# Patient Record
Sex: Male | Born: 1950 | Race: White | Hispanic: No | Marital: Married | Smoking: Former smoker
Health system: Southern US, Community
[De-identification: ages and names within clinical notes are randomized; demographics above are authoritative.]

## PROBLEM LIST (undated history)

## (undated) DIAGNOSIS — Z973 Presence of spectacles and contact lenses: Secondary | ICD-10-CM

## (undated) DIAGNOSIS — D229 Melanocytic nevi, unspecified: Secondary | ICD-10-CM

## (undated) DIAGNOSIS — Z8619 Personal history of other infectious and parasitic diseases: Secondary | ICD-10-CM

## (undated) DIAGNOSIS — Z9189 Other specified personal risk factors, not elsewhere classified: Secondary | ICD-10-CM

## (undated) DIAGNOSIS — I4510 Unspecified right bundle-branch block: Secondary | ICD-10-CM

## (undated) DIAGNOSIS — I509 Heart failure, unspecified: Secondary | ICD-10-CM

## (undated) DIAGNOSIS — N289 Disorder of kidney and ureter, unspecified: Secondary | ICD-10-CM

## (undated) DIAGNOSIS — G4730 Sleep apnea, unspecified: Secondary | ICD-10-CM

## (undated) DIAGNOSIS — C679 Malignant neoplasm of bladder, unspecified: Secondary | ICD-10-CM

## (undated) DIAGNOSIS — C4492 Squamous cell carcinoma of skin, unspecified: Secondary | ICD-10-CM

## (undated) DIAGNOSIS — Z933 Colostomy status: Secondary | ICD-10-CM

## (undated) DIAGNOSIS — B379 Candidiasis, unspecified: Secondary | ICD-10-CM

## (undated) DIAGNOSIS — C61 Malignant neoplasm of prostate: Secondary | ICD-10-CM

## (undated) DIAGNOSIS — K219 Gastro-esophageal reflux disease without esophagitis: Secondary | ICD-10-CM

## (undated) DIAGNOSIS — Z8719 Personal history of other diseases of the digestive system: Secondary | ICD-10-CM

## (undated) DIAGNOSIS — E119 Type 2 diabetes mellitus without complications: Secondary | ICD-10-CM

## (undated) HISTORY — PX: ABDOMINAL SURGERY: SHX537

## (undated) HISTORY — PX: ILEO CONDUIT: SHX1778

---
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
## 1983-11-13 HISTORY — PX: CHOLECYSTECTOMY: SHX55

## 2005-05-25 DIAGNOSIS — D229 Melanocytic nevi, unspecified: Secondary | ICD-10-CM

## 2005-05-25 HISTORY — DX: Melanocytic nevi, unspecified: D22.9

## 2012-09-02 ENCOUNTER — Other Ambulatory Visit: Payer: Self-pay | Admitting: Urology

## 2012-09-25 ENCOUNTER — Encounter (HOSPITAL_COMMUNITY): Payer: Self-pay | Admitting: Pharmacy Technician

## 2012-09-26 ENCOUNTER — Encounter (HOSPITAL_COMMUNITY): Payer: Self-pay

## 2012-09-26 ENCOUNTER — Ambulatory Visit (HOSPITAL_COMMUNITY)
Admission: RE | Admit: 2012-09-26 | Discharge: 2012-09-26 | Disposition: A | Discharge status: Home / Self Care | Payer: No Typology Code available for payment source | Source: Ambulatory Visit | Attending: Urology | Admitting: Urology

## 2012-09-26 ENCOUNTER — Encounter (HOSPITAL_COMMUNITY)
Admission: RE | Admit: 2012-09-26 | Discharge: 2012-09-26 | Disposition: A | Discharge status: Home / Self Care | Payer: No Typology Code available for payment source | Source: Ambulatory Visit | Attending: Urology | Admitting: Urology

## 2012-09-26 DIAGNOSIS — C61 Malignant neoplasm of prostate: Secondary | ICD-10-CM | POA: Insufficient documentation

## 2012-09-26 DIAGNOSIS — Z01812 Encounter for preprocedural laboratory examination: Secondary | ICD-10-CM | POA: Insufficient documentation

## 2012-09-26 HISTORY — DX: Unspecified right bundle-branch block: I45.10

## 2012-09-26 HISTORY — DX: Gastro-esophageal reflux disease without esophagitis: K21.9

## 2012-09-26 HISTORY — DX: Personal history of other diseases of the digestive system: Z87.19

## 2012-09-26 LAB — BASIC METABOLIC PANEL
CO2: 27 mEq/L (ref 19–32)
Chloride: 104 mEq/L (ref 96–112)
Sodium: 140 mEq/L (ref 135–145)

## 2012-09-26 LAB — SURGICAL PCR SCREEN
MRSA, PCR: NEGATIVE
Staphylococcus aureus: NEGATIVE

## 2012-09-26 LAB — CBC
HCT: 47 % (ref 39.0–52.0)
MCV: 82.9 fL (ref 78.0–100.0)
RBC: 5.67 MIL/uL (ref 4.22–5.81)
WBC: 7.5 10*3/uL (ref 4.0–10.5)

## 2012-09-26 NOTE — Pre-Procedure Instructions (Signed)
PREOP CBC, BMET, CXR WERE DONE TODAY AT [facility] AS PER ORDERS DR. BORDEN.  PT'S T/S TO BE DRAWN DAY OF SURGERY.  EKG WAS DONE 04/21/12-REPORT ON CHART FROM DR. R. ROBERTS.

## 2012-09-26 NOTE — Progress Notes (Signed)
09/26/12 1424  OBSTRUCTIVE SLEEP APNEA  Have you ever been diagnosed with sleep apnea through a sleep study? 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  0  Do you often feel tired, fatigued, or sleepy during the daytime? 0  Has anyone observed you stop breathing during your sleep? 1  Do you have, or are you being treated for high blood pressure? 0  BMI more than 35 kg/m2? 1  Age over 46 years old? 1  Neck circumference greater than 40 cm/18 inches? 0  Gender: 1  Obstructive Sleep Apnea Score 4   Score 4 or greater  Results sent to PCP

## 2012-09-26 NOTE — Patient Instructions (Signed)
YOUR SURGERY IS SCHEDULED AT [facility]  ON:  Thursday  11/21  AT 7:15 AM  REPORT TO [facility] AT:  5:15 AM      PHONE # FOR SHORT STAY IS [phone] FOLLOW ANY BOWEL PREP INSTRUCTIONS DAY BEFORE YOUR SURGERY GIVEN BY DR. Vevelyn Royals OFFICE.  DO NOT EAT OR DRINK ANYTHING AFTER MIDNIGHT THE NIGHT BEFORE YOUR SURGERY.  YOU MAY BRUSH YOUR TEETH, RINSE OUT YOUR MOUTH--BUT NO WATER, NO FOOD, NO CHEWING GUM, NO MINTS, NO CANDIES, NO CHEWING TOBACCO.  PLEASE TAKE THE FOLLOWING MEDICATIONS THE AM OF YOUR SURGERY WITH A FEW SIPS OF WATER:  PRILOSEC   IF YOU USE INHALERS--USE YOUR INHALERS THE AM OF YOUR SURGERY AND BRING INHALERS TO THE HOSPITAL -TAKE TO SURGERY.    IF YOU ARE DIABETIC:  DO NOT TAKE ANY DIABETIC MEDICATIONS THE AM OF YOUR SURGERY.  IF YOU TAKE INSULIN IN THE EVENINGS--PLEASE ONLY TAKE 1/2 NORMAL EVENING DOSE THE NIGHT BEFORE YOUR SURGERY.  NO INSULIN THE AM OF YOUR SURGERY.  IF YOU HAVE SLEEP APNEA AND USE CPAP OR BIPAP--PLEASE BRING THE MASK AND THE TUBING.  DO NOT BRING YOUR MACHINE.  DO NOT BRING VALUABLES, MONEY, CREDIT CARDS.  DO NOT WEAR JEWELRY, MAKE-UP, NAIL POLISH AND NO METAL PINS OR CLIPS IN YOUR HAIR. CONTACT LENS, DENTURES / PARTIALS, GLASSES SHOULD NOT BE WORN TO SURGERY AND IN MOST CASES-HEARING AIDS WILL NEED TO BE REMOVED.  BRING YOUR GLASSES CASE, ANY EQUIPMENT NEEDED FOR YOUR CONTACT LENS. FOR PATIENTS ADMITTED TO THE HOSPITAL--CHECK OUT TIME THE DAY OF DISCHARGE IS 11:00 AM.  ALL INPATIENT ROOMS ARE PRIVATE - WITH BATHROOM, TELEPHONE, TELEVISION AND WIFI INTERNET.  IF YOU ARE BEING DISCHARGED THE SAME DAY OF YOUR SURGERY--YOU CAN NOT DRIVE YOURSELF HOME--AND SHOULD NOT GO HOME ALONE BY TAXI OR BUS.  NO DRIVING OR OPERATING MACHINERY FOR 24 HOURS FOLLOWING ANESTHESIA / PAIN MEDICATIONS.  PLEASE MAKE ARRANGEMENTS FOR SOMEONE TO BE WITH YOU AT HOME THE FIRST 24 HOURS AFTER SURGERY. RESPONSIBLE DRIVER'S NAME___________________________                                          PHONE #   _______________________                                  PLEASE READ OVER ANY  FACT SHEETS THAT YOU WERE GIVEN: MRSA INFORMATION, BLOOD TRANSFUSION INFORMATION, INCENTIVE SPIROMETER INFORMATION.

## 2012-10-01 NOTE — H&P (Signed)
Chief Complaint  Prostate Cancer   Reason For Visit  Reason for consult: To discuss treatment options for prostate cancer and specifically to consider a robotic prostatectomy. 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 Vernie Ammons.  Dr. Vernie Ammons performed a prostate biopsy on 06/23/12 which confirmed Gleason 3+3=6 adenocarcinoma of the prostate in 2 out of 12 biopsy cores.  He does have a family history of prostate cancer. His grandfather was diagnosed with prostate cancer did not die of prostate cancer.  TNM stage: cT1c Nx Mx PSA: 4.1 Gleason score: 3+3=6 Biopsy (06/23/12): 2/12 cores positive -- L apex (25%), R apex (30%) Prostate volume: 36.9 cc  Nomogram: OC disease: 90% EPE: 7% SVI: 1% LNI: 1.3% PFS (surgery): 98%, 97%  Urinary function: He does have mild lower urinary tract symptoms including nocturia one time per night. IPSS is 5. Erectile function: He does have significant erectile dysfunction and does take Viagra 50 mg with only intermittent success. SHIM score is 9.   Past Medical History Problems  1. History of  Esophageal Reflux 530.81 2. History of  Right Bundle Branch Block 426.4   He has a history of a right bundle branch block although has not had any issues related to this. He has had a history of palpitations although these decreased after he cut back his caffeine use. He did have chest pain one year ago and underwent a cardiac evaluation including a stress test and echocardiogram which were unremarkable at that time.   Surgical History Problems  1. History of  Appendectomy 2. History of  Biopsy Of The Prostate Needle 3. History of  Gallbladder Surgery  Current Meds 1. Fish Oil 1000 MG Oral Capsule; Therapy: (Recorded:15Jul2013) to 2. PriLOSEC 20 MG Oral Capsule Delayed  Release; Therapy: (Recorded:15Jul2013) to 3. Simvastatin 20 MG Oral Tablet; Therapy: (Recorded:15Jul2013) to 4. Vitamin D TABS; Therapy: (Recorded:15Jul2013) to  Allergies Medication  1. No Known Drug Allergies  Family History Problems  1. Family history of  Prostate Cancer V16.42 2. Family history of  Renal Cell Carcinoma V16.51  Social History Problems    Alcohol Use   Former Smoker V15.82 1ppdx30 years ago   Marital History - Currently Married  Review of Systems Constitutional, skin, eye, otolaryngeal, hematologic/lymphatic, cardiovascular, pulmonary, endocrine, musculoskeletal, gastrointestinal, neurological and psychiatric system(s) were reviewed and pertinent findings if present are noted.    Vitals Vital Signs [Data Includes: Last 1 Day]  01Nov2013 11:39AM  Blood Pressure: 135 / 86 Heart Rate: 76  Physical Exam Constitutional: Well nourished and well developed . No acute distress.  ENT:. The ears and nose are normal in appearance.  Neck: The appearance of the neck is normal and no neck mass is present.  Pulmonary: No respiratory distress, normal respiratory rhythm and effort and clear bilateral breath sounds.  Cardiovascular: Heart rate and rhythm are normal . No peripheral edema.  Abdomen: right lower quadrant, upper midline incision site(s) well healed Amended By: Heloise Purpura; 09/12/2012 12:45 PMEST. The abdomen is obese. The abdomen is soft and nontender. No masses are palpated. No CVA tenderness. No hernias are palpable. No hepatosplenomegaly noted.  Rectal: Rectal exam demonstrates normal sphincter tone, no tenderness and no masses. Prostate size is estimated to be 40 g. The prostate has no nodularity and is not tender. The left  seminal vesicle is nonpalpable. The right seminal vesicle is nonpalpable. The perineum is normal on inspection.  Lymphatics: The femoral and inguinal nodes are not enlarged or tender.  Skin: Normal skin turgor, no visible rash and no  visible skin lesions.  Neuro/Psych:. Mood and affect are appropriate.    Results/Data Urine [Data Includes: Last 1 Day]   01Nov2013  COLOR YELLOW   APPEARANCE CLEAR   SPECIFIC GRAVITY 1.015   pH 7.0   GLUCOSE NEG mg/dL  BILIRUBIN NEG   KETONE NEG mg/dL  BLOOD NEG   PROTEIN NEG mg/dL  UROBILINOGEN 1 mg/dL  NITRITE NEG   LEUKOCYTE ESTERASE SMALL   SQUAMOUS EPITHELIAL/HPF RARE   WBC 3-6 WBC/hpf  RBC NONE SEEN RBC/hpf  BACTERIA NONE SEEN   CRYSTALS NONE SEEN   CASTS NONE SEEN     I reviewed his medical records, PSA results, and pathology report. Findings are as dictated above.   Assessment  Adenocarcinoma Of The Prostate Gland 185    Plan  Adenocarcinoma Of The Prostate Gland (185)  1. Follow-up Office  Follow-up  Requested for: 01Nov2013 2. PT Referral Referral  Referral  Requested for: 21Oct2013 Health Maintenance (V70.0)  3. UA With REFLEX  Done: 01Nov2013 11:15AM  Adenocarcinoma Of The Prostate Gland 185    Discussion/Summary  1. Low risk clinically localized prostate cancer:   The patient was counseled about the natural history of prostate cancer and the standard treatment options that are available for prostate cancer. It was explained to him how his age and life expectancy, clinical stage, Gleason score, and PSA affect his prognosis, the decision to proceed with additional staging studies, as well as how that information influences recommended treatment strategies. We discussed the roles for active surveillance, radiation therapy, surgical therapy, androgen deprivation, as well as ablative therapy options for the treatment of prostate cancer as appropriate to his individual cancer situation. We discussed the risks and benefits of these options with regard to their impact on cancer control and also in terms of potential adverse events, complications, and impact on quiality of life particularly related to urinary, bowel, and sexual function. The patient was encouraged to  ask questions throughout the discussion today and all questions were answered to his stated satisfaction. In addition, the patient was provided with and/or directed to appropriate resources and literature for further education about prostate cancer and treatment options.   We discussed surgical therapy for prostate cancer including the different available surgical approaches. We discussed, in detail, the risks and expectations of surgery with regard to cancer control, urinary control, and erectile function as well as the expected postoperative recovery process. Additional risks of surgery including but not limited to bleeding, infection, hernia formation, nerve damage, lymphocele formation, bowel/rectal injury potentially necessitating colostomy, damage to the urinary tract resulting in urine leakage, urethral stricture, and the cardiopulmonary risks such as myocardial infarction, stroke, death, venothromboembolism, etc. were explained. The risk of open surgical conversion for robotic/laparoscopic prostatectomy was also discussed.   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 He will be scheduled for a bilateral nerve sparing robotic-assisted laparoscopic radical prostatectomy. We specifically discussed the low likelihood of regaining spontaneous erectile function considering his baseline dysfunction and poor response to oral medical therapy.  Cc: Kristopher Thompson Kristopher Thompson    SignaturesElectronically signed by : Heloise Purpura, M.D.; Sep 12 2012 12:45PM

## 2012-10-02 ENCOUNTER — Inpatient Hospital Stay (HOSPITAL_COMMUNITY): Payer: No Typology Code available for payment source | Admitting: Anesthesiology

## 2012-10-02 ENCOUNTER — Encounter (HOSPITAL_COMMUNITY): Payer: Self-pay

## 2012-10-02 ENCOUNTER — Observation Stay (HOSPITAL_COMMUNITY)
Admission: RE | Admit: 2012-10-02 | Discharge: 2012-10-03 | Disposition: A | Discharge status: Home / Self Care | Payer: No Typology Code available for payment source | Source: Ambulatory Visit | Attending: Urology | Admitting: Urology

## 2012-10-02 ENCOUNTER — Encounter (HOSPITAL_COMMUNITY)
Admission: RE | Disposition: A | Discharge status: Home / Self Care | Payer: Self-pay | Source: Ambulatory Visit | Attending: Urology

## 2012-10-02 ENCOUNTER — Encounter (HOSPITAL_COMMUNITY): Payer: Self-pay | Admitting: Anesthesiology

## 2012-10-02 DIAGNOSIS — Z79899 Other long term (current) drug therapy: Secondary | ICD-10-CM | POA: Insufficient documentation

## 2012-10-02 DIAGNOSIS — I4510 Unspecified right bundle-branch block: Secondary | ICD-10-CM | POA: Insufficient documentation

## 2012-10-02 DIAGNOSIS — C61 Malignant neoplasm of prostate: Principal | ICD-10-CM | POA: Insufficient documentation

## 2012-10-02 DIAGNOSIS — K219 Gastro-esophageal reflux disease without esophagitis: Secondary | ICD-10-CM | POA: Insufficient documentation

## 2012-10-02 HISTORY — PX: ROBOT ASSISTED LAPAROSCOPIC RADICAL PROSTATECTOMY: SHX5141

## 2012-10-02 LAB — TYPE AND SCREEN

## 2012-10-02 LAB — ABO/RH: ABO/RH(D): AB POS

## 2012-10-02 LAB — HEMOGLOBIN AND HEMATOCRIT, BLOOD: HCT: 47.4 % (ref 39.0–52.0)

## 2012-10-02 SURGERY — ROBOTIC ASSISTED LAPAROSCOPIC RADICAL PROSTATECTOMY LEVEL 2
Anesthesia: General | Wound class: Clean Contaminated

## 2012-10-02 MED ORDER — CEFAZOLIN SODIUM 1-5 GM-% IV SOLN
1.0000 g | Freq: Three times a day (TID) | INTRAVENOUS | Status: AC
  Administered 2012-10-02 (×2): 1 g via INTRAVENOUS
  Filled 2012-10-02 (×2): qty 50

## 2012-10-02 MED ORDER — LACTATED RINGERS IV SOLN
INTRAVENOUS | Status: DC
  Administered 2012-10-02: 12:00:00 via INTRAVENOUS

## 2012-10-02 MED ORDER — LACTATED RINGERS IR SOLN
Status: DC | PRN
  Administered 2012-10-02: 1000 mL

## 2012-10-02 MED ORDER — LIDOCAINE HCL (CARDIAC) 20 MG/ML IV SOLN
INTRAVENOUS | Status: DC | PRN
  Administered 2012-10-02: 60 mg via INTRAVENOUS

## 2012-10-02 MED ORDER — HYDROMORPHONE HCL PF 1 MG/ML IJ SOLN
INTRAMUSCULAR | Status: AC
  Filled 2012-10-02: qty 1

## 2012-10-02 MED ORDER — SUCCINYLCHOLINE CHLORIDE 20 MG/ML IJ SOLN
INTRAMUSCULAR | Status: DC | PRN
  Administered 2012-10-02: 130 mg via INTRAVENOUS

## 2012-10-02 MED ORDER — NEOSTIGMINE METHYLSULFATE 1 MG/ML IJ SOLN
INTRAMUSCULAR | Status: DC | PRN
  Administered 2012-10-02: 4 mg via INTRAVENOUS

## 2012-10-02 MED ORDER — PROMETHAZINE HCL 25 MG/ML IJ SOLN: 6.2500 mg | INTRAMUSCULAR | Status: DC | PRN

## 2012-10-02 MED ORDER — DIPHENHYDRAMINE HCL 50 MG/ML IJ SOLN: 12.5000 mg | Freq: Four times a day (QID) | INTRAMUSCULAR | Status: DC | PRN

## 2012-10-02 MED ORDER — CARBOXYMETHYLCELLULOSE SODIUM 1 % OP SOLN: 2.0000 [drp] | Freq: Every day | OPHTHALMIC | Status: DC | PRN

## 2012-10-02 MED ORDER — ONDANSETRON HCL 4 MG/2ML IJ SOLN
INTRAMUSCULAR | Status: AC
  Administered 2012-10-02: 4 mg
  Filled 2012-10-02: qty 2

## 2012-10-02 MED ORDER — DEXAMETHASONE SODIUM PHOSPHATE 4 MG/ML IJ SOLN
INTRAMUSCULAR | Status: DC | PRN
  Administered 2012-10-02: 5 mg via INTRAVENOUS

## 2012-10-02 MED ORDER — SODIUM CHLORIDE 0.9 % IR SOLN
Status: DC | PRN
  Administered 2012-10-02: 1000 mL

## 2012-10-02 MED ORDER — ACETAMINOPHEN 10 MG/ML IV SOLN
INTRAVENOUS | Status: DC | PRN
  Administered 2012-10-02: 1000 mg via INTRAVENOUS

## 2012-10-02 MED ORDER — METOCLOPRAMIDE HCL 5 MG/ML IJ SOLN
INTRAMUSCULAR | Status: DC | PRN
  Administered 2012-10-02: 10 mg via INTRAVENOUS

## 2012-10-02 MED ORDER — MIDAZOLAM HCL 5 MG/5ML IJ SOLN
INTRAMUSCULAR | Status: DC | PRN
  Administered 2012-10-02: 2 mg via INTRAVENOUS

## 2012-10-02 MED ORDER — GLYCOPYRROLATE 0.2 MG/ML IJ SOLN
INTRAMUSCULAR | Status: DC | PRN
  Administered 2012-10-02: .6 mg via INTRAVENOUS

## 2012-10-02 MED ORDER — BUPIVACAINE-EPINEPHRINE 0.25% -1:200000 IJ SOLN
INTRAMUSCULAR | Status: DC | PRN
  Administered 2012-10-02: 28 mL

## 2012-10-02 MED ORDER — SODIUM CHLORIDE 0.9 % IV BOLUS (SEPSIS)
1000.0000 mL | Freq: Once | INTRAVENOUS | Status: AC
  Administered 2012-10-02: 1000 mL via INTRAVENOUS

## 2012-10-02 MED ORDER — ONDANSETRON HCL 4 MG/2ML IJ SOLN: 4.0000 mg | INTRAMUSCULAR | Status: DC | PRN

## 2012-10-02 MED ORDER — DOCUSATE SODIUM 100 MG PO CAPS
100.0000 mg | ORAL_CAPSULE | Freq: Two times a day (BID) | ORAL | Status: DC
  Administered 2012-10-02 – 2012-10-03 (×2): 100 mg via ORAL
  Filled 2012-10-02 (×3): qty 1

## 2012-10-02 MED ORDER — MORPHINE SULFATE 2 MG/ML IJ SOLN
INTRAMUSCULAR | Status: AC
  Administered 2012-10-02: 2 mg via INTRAVENOUS
  Filled 2012-10-02: qty 1

## 2012-10-02 MED ORDER — HYDROCODONE-ACETAMINOPHEN 5-325 MG PO TABS: 1.0000 | ORAL_TABLET | Freq: Four times a day (QID) | ORAL | Status: DC | PRN

## 2012-10-02 MED ORDER — ACETAMINOPHEN 325 MG PO TABS: 650.0000 mg | ORAL_TABLET | ORAL | Status: DC | PRN

## 2012-10-02 MED ORDER — HYDROMORPHONE HCL PF 1 MG/ML IJ SOLN
0.2500 mg | INTRAMUSCULAR | Status: DC | PRN
  Administered 2012-10-02 (×3): 0.5 mg via INTRAVENOUS

## 2012-10-02 MED ORDER — PANTOPRAZOLE SODIUM 40 MG PO TBEC
40.0000 mg | DELAYED_RELEASE_TABLET | Freq: Every day | ORAL | Status: DC
  Administered 2012-10-02 – 2012-10-03 (×2): 40 mg via ORAL
  Filled 2012-10-02 (×2): qty 1

## 2012-10-02 MED ORDER — POLYVINYL ALCOHOL 1.4 % OP SOLN
2.0000 [drp] | Freq: Every day | OPHTHALMIC | Status: DC | PRN
  Filled 2012-10-02: qty 15

## 2012-10-02 MED ORDER — MORPHINE SULFATE 2 MG/ML IJ SOLN
2.0000 mg | INTRAMUSCULAR | Status: DC | PRN
  Administered 2012-10-02 (×3): 2 mg via INTRAVENOUS
  Filled 2012-10-02 (×4): qty 1

## 2012-10-02 MED ORDER — KETOROLAC TROMETHAMINE 15 MG/ML IJ SOLN
15.0000 mg | Freq: Four times a day (QID) | INTRAMUSCULAR | Status: DC
  Administered 2012-10-02 – 2012-10-03 (×4): 15 mg via INTRAVENOUS
  Filled 2012-10-02 (×5): qty 1

## 2012-10-02 MED ORDER — LACTATED RINGERS IV SOLN
INTRAVENOUS | Status: DC
  Administered 2012-10-02: 1000 mL via INTRAVENOUS
  Administered 2012-10-02: 10:00:00 via INTRAVENOUS

## 2012-10-02 MED ORDER — CEFAZOLIN SODIUM-DEXTROSE 2-3 GM-% IV SOLR
2.0000 g | INTRAVENOUS | Status: AC
  Administered 2012-10-02: 2 g via INTRAVENOUS

## 2012-10-02 MED ORDER — KCL IN DEXTROSE-NACL 20-5-0.45 MEQ/L-%-% IV SOLN
INTRAVENOUS | Status: DC
  Administered 2012-10-02: 15:00:00 via INTRAVENOUS
  Filled 2012-10-02 (×5): qty 1000

## 2012-10-02 MED ORDER — CIPROFLOXACIN HCL 500 MG PO TABS: 500.0000 mg | ORAL_TABLET | Freq: Two times a day (BID) | ORAL | Status: DC

## 2012-10-02 MED ORDER — HYDROMORPHONE HCL PF 1 MG/ML IJ SOLN
INTRAMUSCULAR | Status: DC | PRN
  Administered 2012-10-02 (×2): 0.5 mg via INTRAVENOUS

## 2012-10-02 MED ORDER — ROCURONIUM BROMIDE 100 MG/10ML IV SOLN
INTRAVENOUS | Status: DC | PRN
  Administered 2012-10-02: 5 mg via INTRAVENOUS
  Administered 2012-10-02: 30 mg via INTRAVENOUS
  Administered 2012-10-02: 25 mg via INTRAVENOUS
  Administered 2012-10-02: 10 mg via INTRAVENOUS
  Administered 2012-10-02: 5 mg via INTRAVENOUS
  Administered 2012-10-02: 20 mg via INTRAVENOUS

## 2012-10-02 MED ORDER — PROPOFOL 10 MG/ML IV BOLUS
INTRAVENOUS | Status: DC | PRN
  Administered 2012-10-02: 200 mg via INTRAVENOUS

## 2012-10-02 MED ORDER — DIPHENHYDRAMINE HCL 12.5 MG/5ML PO ELIX: 12.5000 mg | ORAL_SOLUTION | Freq: Four times a day (QID) | ORAL | Status: DC | PRN

## 2012-10-02 MED ORDER — SUFENTANIL CITRATE 50 MCG/ML IV SOLN
INTRAVENOUS | Status: DC | PRN
  Administered 2012-10-02 (×3): 5 ug via INTRAVENOUS
  Administered 2012-10-02 (×3): 10 ug via INTRAVENOUS
  Administered 2012-10-02: 5 ug via INTRAVENOUS

## 2012-10-02 MED ORDER — EPHEDRINE SULFATE 50 MG/ML IJ SOLN
INTRAMUSCULAR | Status: DC | PRN
  Administered 2012-10-02: 5 mg via INTRAVENOUS

## 2012-10-02 SURGICAL SUPPLY — 40 items
CANISTER SUCTION 2500CC (MISCELLANEOUS) ×2 IMPLANT
CATH ROBINSON RED A/P 8FR (CATHETERS) ×2 IMPLANT
CHLORAPREP W/TINT 26ML (MISCELLANEOUS) ×2 IMPLANT
CLIP LIGATING HEM O LOK PURPLE (MISCELLANEOUS) ×4 IMPLANT
CLOTH BEACON ORANGE TIMEOUT ST (SAFETY) ×2 IMPLANT
CORD HIGH FREQUENCY UNIPOLAR (ELECTROSURGICAL) IMPLANT
COVER SURGICAL LIGHT HANDLE (MISCELLANEOUS) ×2 IMPLANT
COVER TIP SHEARS 8 DVNC (MISCELLANEOUS) ×1 IMPLANT
COVER TIP SHEARS 8MM DA VINCI (MISCELLANEOUS) ×1
CUTTER ECHEON FLEX ENDO 45 340 (ENDOMECHANICALS) ×2 IMPLANT
DECANTER SPIKE VIAL GLASS SM (MISCELLANEOUS) IMPLANT
DRAPE SURG IRRIG POUCH 19X23 (DRAPES) ×2 IMPLANT
DRSG TEGADERM 2-3/8X2-3/4 SM (GAUZE/BANDAGES/DRESSINGS) ×10 IMPLANT
DRSG TEGADERM 4X4.75 (GAUZE/BANDAGES/DRESSINGS) ×2 IMPLANT
DRSG TEGADERM 6X8 (GAUZE/BANDAGES/DRESSINGS) ×4 IMPLANT
ELECT REM PT RETURN 9FT ADLT (ELECTROSURGICAL) ×2
ELECTRODE REM PT RTRN 9FT ADLT (ELECTROSURGICAL) ×1 IMPLANT
GLOVE BIO SURGEON STRL SZ 6.5 (GLOVE) ×2 IMPLANT
GLOVE BIOGEL M STRL SZ7.5 (GLOVE) ×4 IMPLANT
GOWN STRL NON-REIN LRG LVL3 (GOWN DISPOSABLE) ×6 IMPLANT
GOWN STRL REIN XL XLG (GOWN DISPOSABLE) ×4 IMPLANT
HOLDER FOLEY CATH W/STRAP (MISCELLANEOUS) ×2 IMPLANT
IV LACTATED RINGERS 1000ML (IV SOLUTION) ×2 IMPLANT
KIT ACCESSORY DA VINCI DISP (KITS) ×1
KIT ACCESSORY DVNC DISP (KITS) ×1 IMPLANT
NDL SAFETY ECLIPSE 18X1.5 (NEEDLE) ×1 IMPLANT
NEEDLE HYPO 18GX1.5 SHARP (NEEDLE) ×1
PACK ROBOT UROLOGY CUSTOM (CUSTOM PROCEDURE TRAY) ×2 IMPLANT
RELOAD GREEN ECHELON 45 (STAPLE) ×2 IMPLANT
SET TUBE IRRIG SUCTION NO TIP (IRRIGATION / IRRIGATOR) ×2 IMPLANT
SOLUTION ELECTROLUBE (MISCELLANEOUS) ×2 IMPLANT
SPONGE GAUZE 4X4 12PLY (GAUZE/BANDAGES/DRESSINGS) ×2 IMPLANT
SUT ETHILON 3 0 PS 1 (SUTURE) ×2 IMPLANT
SUT VIC AB 0 CT1 27 (SUTURE) ×2
SUT VIC AB 0 CT1 27XBRD ANTBC (SUTURE) ×2 IMPLANT
SUT VICRYL 0 UR6 27IN ABS (SUTURE) ×4 IMPLANT
SYR 27GX1/2 1ML LL SAFETY (SYRINGE) ×2 IMPLANT
TOWEL OR NON WOVEN STRL DISP B (DISPOSABLE) ×2 IMPLANT
TROCAR 12M 150ML BLUNT (TROCAR) ×2 IMPLANT
WATER STERILE IRR 1500ML POUR (IV SOLUTION) ×4 IMPLANT

## 2012-10-02 NOTE — Anesthesia Postprocedure Evaluation (Signed)
  Anesthesia Post-op Note  Patient: Kristopher Thompson  Procedure(s) Performed: Procedure(s) (LRB): ROBOTIC ASSISTED LAPAROSCOPIC RADICAL PROSTATECTOMY LEVEL 2 (N/A)  Patient Location: PACU  Anesthesia Type: General  Level of Consciousness: awake and alert   Airway and Oxygen Therapy: Patient Spontanous Breathing  Post-op Pain: mild  Post-op Assessment: Post-op Vital signs reviewed, Patient's Cardiovascular Status Stable, Respiratory Function Stable, Patent Airway and No signs of Nausea or vomiting  Last Vitals:  Filed Vitals:   10/02/12 1130  BP: 151/85  Pulse: 86  Temp:   Resp: 12    Post-op Vital Signs: stable   Complications: No apparent anesthesia complications

## 2012-10-02 NOTE — Op Note (Signed)
Preoperative diagnosis: Clinically localized adenocarcinoma of the prostate (clinical stage cT1c Nx Mx)  Postoperative diagnosis: Clinically localized adenocarcinoma of the prostate (clinical stage cT1c Nx Mx)  Procedure:  1. Robotic assisted laparoscopic radical prostatectomy (bilateral nerve sparing)  Surgeon: Rolly Salter, Montez Hageman. M.D.  Assistant: Pecola Leisure, PA-C  Anesthesia: General  Complications: None  EBL: 150 mL  IVF:  1500 mL crystalloid  Specimens: 1. Prostate and seminal vesicles  Disposition of specimens: Pathology  Drains: 1. 20 Fr coude catheter 2. # 19 Blake pelvic drain  Indication: Kristopher Thompson is a 61 y.o. year old patient with clinically localized prostate cancer.  After a thorough review of the management options for treatment of prostate cancer, he elected to proceed with surgical therapy and the above procedure(s).  We have discussed the potential benefits and risks of the procedure, side effects of the proposed treatment, the likelihood of the patient achieving the goals of the procedure, and any potential problems that might occur during the procedure or recuperation. Informed consent has been obtained.  Description of procedure:  The patient was taken to the operating room and a general anesthetic was administered. He was given preoperative antibiotics, placed in the dorsal lithotomy position, and prepped and draped in the usual sterile fashion. Next a preoperative timeout was performed. A urethral catheter was placed into the bladder and a site was selected near the umbilicus for placement of the camera port. This was placed using a standard open Hassan technique which allowed entry into the peritoneal cavity under direct vision and without difficulty. A long 12 mm port was placed and a pneumoperitoneum established. Due to the patient's obesity, there was some initial difficulty with ventilation.  The pneumoperitoneum was lowered and the  anesthesia team felt they could adequately ventilate the patient safely with lower pressures. The camera was then used to inspect the abdomen and there was no evidence of any intra-abdominal injuries or other abnormalities. The remaining abdominal ports were then placed. 8 mm robotic ports were placed in the right lower quadrant, left lower quadrant, and far left lateral abdominal wall. A 5 mm port was placed in the right upper quadrant and a 12 mm port was placed in the right lateral abdominal wall for laparoscopic assistance. All ports were placed under direct vision without difficulty. The surgical cart was then docked.   Utilizing the cautery scissors, the bladder was reflected posteriorly allowing entry into the space of Retzius and identification of the endopelvic fascia and prostate. The periprostatic fat was then removed from the prostate allowing full exposure of the endopelvic fascia. The endopelvic fascia was then incised from the apex back to the base of the prostate bilaterally and the underlying levator muscle fibers were swept laterally off the prostate thereby isolating the dorsal venous complex. The dorsal vein was then stapled and divided with a 45 mm Flex Echelon stapler. Attention then turned to the bladder neck which was divided anteriorly thereby allowing entry into the bladder and exposure of the urethral catheter. The catheter balloon was deflated and the catheter was brought into the operative field and used to retract the prostate anteriorly. The posterior bladder neck was then examined and was divided allowing further dissection between the bladder and prostate posteriorly until the vasa deferentia and seminal vessels were identified. The vasa deferentia were isolated, divided, and lifted anteriorly. The seminal vesicles were dissected down to their tips with care to control the seminal vascular arterial blood supply. These structures were then lifted anteriorly  and the space between  Denonvillier's fascia and the anterior rectum was developed with a combination of sharp and blunt dissection. This isolated the vascular pedicles of the prostate.  The lateral prostatic fascia was then sharply incised allowing release of the neurovascular bundles bilaterally. The vascular pedicles of the prostate were then ligated with Weck clips between the prostate and neurovascular bundles and divided with sharp cold scissor dissection resulting in neurovascular bundle preservation. The neurovascular bundles were then separated off the apex of the prostate and urethra bilaterally.  The urethra was then sharply transected allowing the prostate specimen to be disarticulated. The pelvis was copiously irrigated and hemostasis was ensured. There was no evidence for rectal injury.  Attention then turned to the urethral anastomosis. A 2-0 Vicryl slip knot was placed between Denonvillier's fascia, the posterior bladder neck, and the posterior urethra to reapproximate these structures. A double-armed 3-0 Monocryl suture was then used to perform a 360 running tension-free anastomosis between the bladder neck and urethra. A new urethral catheter was then placed into the bladder and irrigated. There were no blood clots within the bladder and the anastomosis appeared to be watertight. A #19 Blake drain was then brought through the left lateral 8 mm port site and positioned appropriately within the pelvis. It was secured to the skin with a nylon suture. The surgical cart was then undocked. The right lateral 12 mm port site was closed at the fascial level with a 0 Vicryl suture placed laparoscopically. All remaining ports were then removed under direct vision. The prostate specimen was removed intact within the Endopouch retrieval bag via the periumbilical camera port site. This fascial opening was closed with two running 0 Vicryl sutures. 0.25% Marcaine was then injected into all port sites and all incisions were  reapproximated at the skin level with staples. Sterile dressings were applied. The patient appeared to tolerate the procedure well and without complications. The patient was able to be extubated and transferred to the recovery unit in satisfactory condition.  Moody Bruins MD

## 2012-10-02 NOTE — Anesthesia Preprocedure Evaluation (Signed)
Anesthesia Evaluation  Patient identified by MRN, date of birth, ID band Patient awake    Reviewed: Allergy & Precautions, H&P , NPO status , Patient's Chart, lab work & pertinent test results  History of Anesthesia Complications (+) Emergence Delirium  Airway Mallampati: III TM Distance: >3 FB Neck ROM: Full    Dental No notable dental hx.    Pulmonary neg pulmonary ROS,  breath sounds clear to auscultation  Pulmonary exam normal       Cardiovascular negative cardio ROS  - dysrhythmias Rhythm:Regular Rate:Normal     Neuro/Psych negative neurological ROS  negative psych ROS   GI/Hepatic negative GI ROS, Neg liver ROS, hiatal hernia, GERD-  ,  Endo/Other  negative endocrine ROS  Renal/GU negative Renal ROS  negative genitourinary   Musculoskeletal negative musculoskeletal ROS (+)   Abdominal   Peds negative pediatric ROS (+)  Hematology negative hematology ROS (+)   Anesthesia Other Findings   Reproductive/Obstetrics negative OB ROS                           Anesthesia Physical Anesthesia Plan  ASA: II  Anesthesia Plan: General   Post-op Pain Management:    Induction: Intravenous  Airway Management Planned: Oral ETT  Additional Equipment:   Intra-op Plan:   Post-operative Plan: Extubation in OR  Informed Consent: I have reviewed the patients History and Physical, chart, labs and discussed the procedure including the risks, benefits and alternatives for the proposed anesthesia with the patient or authorized representative who has indicated his/her understanding and acceptance.   Dental advisory given  Plan Discussed with: CRNA  Anesthesia Plan Comments:         Anesthesia Quick Evaluation

## 2012-10-02 NOTE — Interval H&P Note (Signed)
History and Physical Interval Note:  10/02/2012 7:13 AM  Kristopher Thompson  has presented today for surgery, with the diagnosis of PROSTATE CANCER  The various methods of treatment have been discussed with the patient and family. After consideration of risks, benefits and other options for treatment, the patient has consented to  Procedure(s) (LRB) with comments: ROBOTIC ASSISTED LAPAROSCOPIC RADICAL PROSTATECTOMY LEVEL 2 (N/A) as a surgical intervention .  The patient's history has been reviewed, patient examined, no change in status, stable for surgery.  I have reviewed the patient's chart and labs.  Questions were answered to the patient's satisfaction.     Romey Mathieson,LES

## 2012-10-02 NOTE — Transfer of Care (Signed)
Immediate Anesthesia Transfer of Care Note  Patient: Kristopher Thompson  Procedure(s) Performed: Procedure(s) (LRB) with comments: ROBOTIC ASSISTED LAPAROSCOPIC RADICAL PROSTATECTOMY LEVEL 2 (N/A)  Patient Location: PACU  Anesthesia Type:General  Level of Consciousness: awake, alert , oriented and patient cooperative  Airway & Oxygen Therapy: Patient Spontanous Breathing and Patient connected to face mask oxygen  Post-op Assessment: Report given to PACU RN, Post -op Vital signs reviewed and stable and Patient moving all extremities  Post vital signs: Reviewed and stable  Complications: No apparent anesthesia complications

## 2012-10-03 ENCOUNTER — Encounter (HOSPITAL_COMMUNITY): Payer: Self-pay | Admitting: Urology

## 2012-10-03 LAB — HEMOGLOBIN AND HEMATOCRIT, BLOOD
HCT: 42.3 % (ref 39.0–52.0)
Hemoglobin: 14.6 g/dL (ref 13.0–17.0)

## 2012-10-03 MED ORDER — HYDROCODONE-ACETAMINOPHEN 5-325 MG PO TABS
1.0000 | ORAL_TABLET | Freq: Four times a day (QID) | ORAL | Status: DC | PRN
  Administered 2012-10-03 (×2): 2 via ORAL
  Filled 2012-10-03 (×2): qty 2

## 2012-10-03 MED ORDER — BISACODYL 10 MG RE SUPP
10.0000 mg | Freq: Once | RECTAL | Status: AC
  Administered 2012-10-03: 10 mg via RECTAL
  Filled 2012-10-03: qty 1

## 2012-10-03 NOTE — Care Management Note (Signed)
    Page 1 of 1   10/03/2012     12:29:40 PM   CARE MANAGEMENT NOTE 10/03/2012  Patient:  Kristopher Thompson, Kristopher Thompson   Account Number:  [id number]  Date Initiated:  10/03/2012  Documentation initiated by:  Lanier Clam  Subjective/Objective Assessment:   ADMITTED W/PROSTATE CA.     Action/Plan:   FROM HOME   Anticipated DC Date:  10/03/2012   Anticipated DC Plan:  HOME/SELF CARE      DC Planning Services  CM consult      Choice offered to / List presented to:             Status of service:  Completed, signed off Medicare Important Message given?   (If response is "NO", the following Medicare IM given date fields will be blank) Date Medicare IM given:   Date Additional Medicare IM given:    Discharge Disposition:  HOME/SELF CARE  Per UR Regulation:  Reviewed for med. necessity/level of care/duration of stay  If discussed at Long Length of Stay Meetings, dates discussed:    Comments:  10/03/12 [facility] Arth Nicastro RN,BSN NCM [phone]

## 2012-10-03 NOTE — Discharge Summary (Signed)
Date of admission: 10/02/2012  Date of discharge: 10/03/2012  Admission diagnosis: Prostate Cancer  Discharge diagnosis: Prostate Cancer  History and Physical: For full details, please see admission history and physical. Briefly, Kristopher Thompson is a 61 y.o. gentleman with localized prostate cancer.  After discussing management/treatment options, he elected to proceed with surgical treatment.  Hospital Course: Kristopher Thompson was taken to the operating room on 10/02/2012 and underwent a robotic assisted laparoscopic radical prostatectomy. He tolerated this procedure well and without complications. Postoperatively, he was able to be transferred to a regular hospital room following recovery from anesthesia.  He was able to begin ambulating the night of surgery. He remained hemodynamically stable overnight.  He had excellent urine output with appropriately minimal output from his pelvic drain and his pelvic drain was removed on POD #1.  He was transitioned to oral pain medication, tolerated a clear liquid diet, and had met all discharge criteria and was able to be discharged home later on POD#1.  Laboratory values:  Basename 10/03/12 1035 10/03/12 0450 10/02/12 1112  HGB 14.2 14.6 17.0  HCT 40.1 42.3 47.4    Disposition: Home  Discharge instruction: He was instructed to be ambulatory but to refrain from heavy lifting, strenuous activity, or driving. He was instructed on urethral catheter care.  Discharge medications:     Medication List     As of 10/03/2012 12:24 PM    START taking these medications         ciprofloxacin 500 MG tablet   Commonly known as: CIPRO   Take 1 tablet (500 mg total) by mouth 2 (two) times daily. Start day prior to office visit for foley removal      HYDROcodone-acetaminophen 5-325 MG per tablet   Commonly known as: NORCO/VICODIN   Take 1-2 tablets by mouth every 6 (six) hours as needed for pain.      CONTINUE taking these medications        acetaminophen 500 MG tablet   Commonly known as: TYLENOL      omeprazole 20 MG capsule   Commonly known as: PRILOSEC      REFRESH 1 % ophthalmic solution   Generic drug: carboxymethylcellulose      STOP taking these medications         sildenafil 100 MG tablet   Commonly known as: VIAGRA          Where to get your medications    These are the prescriptions that you need to pick up.   You may get these medications from any pharmacy.         ciprofloxacin 500 MG tablet   HYDROcodone-acetaminophen 5-325 MG per tablet            Followup: He will followup in 1 week for catheter removal and to discuss his surgical pathology results.

## 2012-10-03 NOTE — Progress Notes (Signed)
Patient ID: Kristopher Thompson, male   DOB: 1951/08/31, 61 y.o.   MRN: [id number]  1 Day Post-Op Subjective: The patient is doing well.  No nausea or vomiting. Pain is adequately controlled. Noted to have low BP overnight. No dizziness, SOB, CP, or other complaints.  Objective: Vital signs in last 24 hours: Temp:  [97.6 F (36.4 C)-98.5 F (36.9 C)] 98.4 F (36.9 C) (11/22 0435) Pulse Rate:  [70-92] 70  (11/22 0435) Resp:  [12-18] 18  (11/22 0435) BP: (85-169)/(54-97) 85/54 mmHg (11/22 0435) SpO2:  [96 %-100 %] 97 % (11/22 0435) Weight:  [119.75 kg (264 lb)] 119.75 kg (264 lb) (11/21 1220)  Intake/Output from previous day: 11/21 0701 - 11/22 0700 In: 5955 [P.O.:690; I.V.:4215; IV Piggyback:1050] Out: 5036 [Urine:4800; Drains:86; Blood:150] Intake/Output this shift:    Physical Exam:  General: Alert and oriented. CV: RRR Lungs: Clear bilaterally. GI: Soft, Nondistended. Incisions: Dressings intact. Urine: Clear Extremities: Nontender, no erythema, no edema.  Lab Results:  Basename 10/03/12 0450 10/02/12 1112  HGB 14.6 17.0  HCT 42.3 47.4      Assessment/Plan: POD# 1 s/p robotic prostatectomy.  1) Maintain IVF in face of low BP 2) Ambulate, Incentive spirometry 3) Transition to oral pain medication 4) Dulcolax suppository 5) D/C pelvic drain 6) Recheck H/H and monitor BP.  Pt is not on beta blocker and has had excellent UOP and normal HR. 7) Plan for likely discharge later today if BP improved and H/H stable   Rolly Salter, Montez Hageman. MD   LOS: 1 day   Jaxxon Naeem,LES 10/03/2012, 7:19 AM

## 2013-05-22 ENCOUNTER — Other Ambulatory Visit: Payer: Self-pay | Admitting: Physician Assistant

## 2013-05-22 DIAGNOSIS — C4492 Squamous cell carcinoma of skin, unspecified: Secondary | ICD-10-CM

## 2013-05-22 HISTORY — DX: Squamous cell carcinoma of skin, unspecified: C44.92

## 2014-03-18 ENCOUNTER — Other Ambulatory Visit: Payer: Self-pay | Admitting: Physician Assistant

## 2015-02-16 ENCOUNTER — Other Ambulatory Visit: Payer: Self-pay | Admitting: Physician Assistant

## 2016-05-28 ENCOUNTER — Other Ambulatory Visit: Payer: Self-pay | Admitting: Gastroenterology

## 2017-05-24 DIAGNOSIS — R310 Gross hematuria: Secondary | ICD-10-CM | POA: Diagnosis not present

## 2017-05-24 DIAGNOSIS — N3289 Other specified disorders of bladder: Secondary | ICD-10-CM | POA: Diagnosis not present

## 2017-05-29 ENCOUNTER — Other Ambulatory Visit: Payer: Self-pay | Admitting: Urology

## 2017-06-03 NOTE — Patient Instructions (Signed)
[name], DDS  06/03/2017   Your procedure is scheduled on: 06/10/17  Report to [facility] Main  Entrance Take [place]  elevators to 3rd floor to  [place] at       2:15  PM.    Call this number if you have problems the morning of surgery [phone]    Remember: ONLY 1 PERSON MAY GO WITH YOU TO SHORT STAY TO GET  READY MORNING OF [place].  Do not eat food  :After Midnight.  YOU MAY HAVE CLEAR LIQUIDS UNTIL 1000 AM THEN NOTHING BY MOUTH     Take these medicines the morning of surgery with A SIP OF WATER: NONE                                You may not have any metal on your body including hair pins and              piercings  Do not wear jewelry, , lotions, powders or perfumes, deodorant                       Men may shave face and neck.   Do not bring valuables to the hospital. [place].  Contacts, dentures or bridgework may not be worn into surgery.  Leave suitcase in the car. After surgery it may be brought to your room.     Patients discharged the day of surgery will not be allowed to drive home.  Name and phone number of your driver:  Special Instructions: N/A              Please read over the following fact sheets you were given: _____________________________________________________________________             [facility] - Preparing for Surgery Before surgery, you can play an important role.  Because skin is not sterile, your skin needs to be as free of germs as possible.  You can reduce the number of germs on your skin by washing with CHG (chlorahexidine gluconate) soap before surgery.  CHG is an antiseptic cleaner which kills germs and bonds with the skin to continue killing germs even after washing. Please DO NOT use if you have an allergy to CHG or antibacterial soaps.  If your skin becomes reddened/irritated stop using the CHG and inform your nurse when you arrive at  Short Stay. Do not shave (including legs and underarms) for at least 48 hours prior to the first CHG shower.  You may shave your face/neck. Please follow these instructions carefully:  1.  Shower with CHG Soap the night before surgery and the  morning of Surgery.  2.  If you choose to wash your hair, wash your hair first as usual with your  normal  shampoo.  3.  After you shampoo, rinse your hair and body thoroughly to remove the  shampoo.                           4.  Use CHG as you would any other liquid soap.  You can apply chg directly  to the skin and wash  Gently with a scrungie or clean washcloth.  5.  Apply the CHG Soap to your body ONLY FROM THE NECK DOWN.   Do not use on face/ open                           Wound or open sores. Avoid contact with eyes, ears mouth and genitals (private parts).                       Wash face,  Genitals (private parts) with your normal soap.             6.  Wash thoroughly, paying special attention to the area where your surgery  will be performed.  7.  Thoroughly rinse your body with warm water from the neck down.  8.  DO NOT shower/wash with your normal soap after using and rinsing off  the CHG Soap.                9.  Pat yourself dry with a clean towel.            10.  Wear clean pajamas.            11.  Place clean sheets on your bed the night of your first shower and do not  sleep with pets. Day of Surgery : Do not apply any lotions/deodorants the morning of surgery.  Please wear clean clothes to the hospital/surgery center.  FAILURE TO FOLLOW THESE INSTRUCTIONS MAY RESULT IN THE CANCELLATION OF YOUR SURGERY PATIENT SIGNATURE_________________________________  NURSE SIGNATURE__________________________________  ________________________________________________________________________    CLEAR LIQUID DIET   Foods Allowed                                                                     Foods Excluded  Coffee and tea,  regular and decaf                             liquids that you cannot  Plain Jell-O in any flavor                                             see through such as: Fruit ices (not with fruit pulp)                                     milk, soups, orange juice  Iced Popsicles                                    All solid food Carbonated beverages, regular and diet                                    Cranberry, grape and apple juices Sports drinks like Gatorade Lightly seasoned clear broth or consume(fat free) Sugar, honey syrup  Sample Menu Breakfast                                Lunch                                     Supper Cranberry juice                    Beef broth                            Chicken broth Jell-O                                     Grape juice                           Apple juice Coffee or tea                        Jell-O                                      Popsicle                                                Coffee or tea                        Coffee or tea  _____________________________________________________________________

## 2017-06-04 ENCOUNTER — Encounter (INDEPENDENT_AMBULATORY_CARE_PROVIDER_SITE_OTHER): Payer: Self-pay

## 2017-06-04 ENCOUNTER — Encounter (HOSPITAL_COMMUNITY)
Admission: RE | Admit: 2017-06-04 | Discharge: 2017-06-04 | Disposition: A | Discharge status: Home / Self Care | Payer: Medicare Other | Source: Ambulatory Visit | Attending: Urology | Admitting: Urology

## 2017-06-04 ENCOUNTER — Encounter (HOSPITAL_COMMUNITY): Payer: Self-pay

## 2017-06-04 DIAGNOSIS — N329 Bladder disorder, unspecified: Secondary | ICD-10-CM | POA: Insufficient documentation

## 2017-06-04 DIAGNOSIS — N1330 Unspecified hydronephrosis: Secondary | ICD-10-CM | POA: Insufficient documentation

## 2017-06-04 DIAGNOSIS — Z01812 Encounter for preprocedural laboratory examination: Secondary | ICD-10-CM | POA: Diagnosis not present

## 2017-06-04 LAB — BASIC METABOLIC PANEL
Anion gap: 8 (ref 5–15)
BUN: 18 mg/dL (ref 6–20)
CALCIUM: 9.4 mg/dL (ref 8.9–10.3)
CO2: 27 mmol/L (ref 22–32)
CREATININE: 1.02 mg/dL (ref 0.61–1.24)
Chloride: 107 mmol/L (ref 101–111)
GFR calc Af Amer: >60 mL/min (ref >60)
Glucose, Bld: 104 mg/dL — ABNORMAL HIGH (ref 65–99)
POTASSIUM: 4.6 mmol/L (ref 3.5–5.1)
SODIUM: 142 mmol/L (ref 135–145)

## 2017-06-04 LAB — CBC
HCT: 46.6 % (ref 39.0–52.0)
Hemoglobin: 16.5 g/dL (ref 13.0–17.0)
MCH: 29.8 pg (ref 26.0–34.0)
MCHC: 35.4 g/dL (ref 30.0–36.0)
MCV: 84.3 fL (ref 78.0–100.0)
PLATELETS: 133 10*3/uL — AB (ref 150–400)
RBC: 5.53 MIL/uL (ref 4.22–5.81)
RDW: 13.6 % (ref 11.5–15.5)
WBC: 7.2 10*3/uL (ref 4.0–10.5)

## 2017-06-04 NOTE — Progress Notes (Signed)
Cbc done 06/03/17 routed to Dr. [name] via epic

## 2017-06-07 NOTE — H&P (Signed)
CC/HPI: Gross hematuria   Kristopher Thompson is seen today after developing new onset painless gross hematuria yesterday. He also had 1 episode at the time of a bowel movement about a month ago but was unsure whether this was truly hematuria. He has denied any flank pain, dysuria, frequency, or increased incontinence. He has a very distant history of tobacco use over 40 years ago. He has no history of urolithiasis.     ALLERGIES: No Allergies    MEDICATIONS: Cialis 5 mg tablet TAKE ONE TABLET BY MOUTH ONCE DAILY AS DIRECTED  PriLOSEC 20 MG Oral Capsule Delayed Release Oral  Vitamin D TABS Oral     GU PSH: Prostate Needle Biopsy - 2013 Robotic Radical Prostatectomy - 2013      PSH Notes: Prostatect Retropubic Radical W/ Nerve Sparing Laparoscopic, Biopsy Of The Prostate Needle, Gallbladder Surgery, Appendectomy   NON-GU PSH: Appendectomy - 2013    GU PMH: ED due to arterial insufficiency, Erectile dysfunction due to arterial insufficiency - 07/25/2015 Prostate Cancer, Adenocarcinoma of prostate - 07/25/2015 Stress Incontinence, Male stress incontinence - 07/25/2015      PMH Notes: 1) Prostate cancer: he is s/p a BNS RAL radical prostatectomy on 10/02/12. His PSA has been undetectable since surgery.   Diagnosis: pT2c Nx Mx, Gleason 3+4=7 adenocarcinoma with negative surgical margins  Pretreatment PSA: 4.1  Pretreatment SHIM: 9     NON-GU PMH: Personal history of other diseases of the digestive system, History of esophageal reflux - 2014    FAMILY HISTORY: Prostate Cancer - Runs In Family Pulmonary hypertension, mild - Runs In Family Renal Cell Carcinoma - Runs In Family   SOCIAL HISTORY: Marital Status: Married Current Smoking Status: Patient does not smoke anymore. Has not smoked since 04/12/1977.   Tobacco Use Assessment Completed: Used Tobacco in last 30 days? Does drink.  Drinks 3 caffeinated drinks per day.     Notes: Former smoker, Environmental consultant In Usual Daily Activities,  Living Independently With Spouse, Activities Of Daily Living, Exercise Habits, Marital History - Currently Married, Alcohol Use   REVIEW OF SYSTEMS:    GU Review Male:   Patient reports leakage of urine. Patient denies penile pain, get up at night to urinate, hard to postpone urination, trouble starting your stream, burning/ pain with urination, stream starts and stops, erection problems, frequent urination, and have to strain to urinate .  Gastrointestinal (Upper):   Patient reports indigestion/ heartburn. Patient denies nausea and vomiting.  Gastrointestinal (Lower):   Patient denies diarrhea and constipation.  Constitutional:   Patient denies fever, night sweats, weight loss, and fatigue.  Skin:   Patient denies skin rash/ lesion and itching.  Eyes:   Patient denies blurred vision and double vision.  Ears/ Nose/ Throat:   Patient denies sore throat and sinus problems.  Hematologic/Lymphatic:   Patient denies swollen glands and easy bruising.  Cardiovascular:   Patient denies leg swelling and chest pains.  Respiratory:   Patient denies cough and shortness of breath.  Endocrine:   Patient denies excessive thirst.  Musculoskeletal:   Patient reports back pain and joint pain.   Neurological:   Patient denies headaches and dizziness.  Psychologic:   Patient denies depression and anxiety.   VITAL SIGNS:      05/10/2017 11:19 AM  Weight 265 lb / 120.2 kg  Height 72 in / 182.88 cm  BP 124/77 mmHg  Pulse 65 /min  Temperature 97.5 F / 36 C  BMI 35.9 kg/m   MULTI-SYSTEM  PHYSICAL EXAMINATION:    Constitutional: Well-nourished. No physical deformities. Normally developed. Good grooming.  Gastrointestinal: No mass, no tenderness, no rigidity, non obese abdomen. No CVA tenderness. Incision sites well healed.     PAST DATA REVIEWED:  Source Of History:  Patient  Urine Test Review:   Urinalysis   08/24/16 08/06/15 01/14/15 05/08/14 10/24/13 04/04/13 12/03/12  PSA  Total PSA < 0.02 ng/dl <0.01   <0.01  <0.01  <0.01  <0.01  <0.01   Notes   Notify Patient - PSA is undetectable. result mailed to patient hs-cma SPECIMEN TYPE: BLOOD  RESULT REPEATED AND VERIFIED. TEST METHODOLOGY: ECLIA PSA (ELECTROCHEMILUMINESCENCE IMMUNOASSAY) result mailed to patient hs-cma  Notify Patient - PSA is undetectable. SPECIMEN TYPE: BLOOD  RESULT REPEATED AND VERIFIED. TEST METHODOLOGY: ECLIA PSA (ELECTROCHEMILUMINESCENCE IMMUNOASSAY)  Notify Patient - PSA is undetectable. result mailed to patient hs-cma SPECIMEN TYPE: BLOOD  RESULT REPEATED AND VERIFIED. TEST METHODOLOGY: ECLIA PSA (ELECTROCHEMILUMINESCENCE IMMUNOASSAY)  Notify Patient - PSA is undetectable. result mailed to patient hs-cma SPECIMEN TYPE: BLOOD  RESULT REPEATED AND VERIFIED. TEST METHODOLOGY: ECLIA PSA (ELECTROCHEMILUMINESCENCE IMMUNOASSAY)  Notify Patient - PSA is undetectable. Result mailed to patient hs-cma SPECIMEN TYPE: BLOOD  RESULT REPEATED AND VERIFIED. TEST METHODOLOGY: ECLIA PSA (ELECTROCHEMILUMINESCENCE IMMUNOASSAY) results mailed to pt.  Notify Patient - PSA is undetectable. SPECIMEN TYPE: BLOOD  Result repeated and verified. Test Methodology: ECLIA PSA (Electrochemiluminescence Immunoassay)    05/10/17  Urinalysis  Urine Appearance Clear   Urine Color Yellow   Urine Glucose Neg   Urine Bilirubin Neg   Urine Ketones Neg   Urine Specific Gravity 1.020   Urine Blood Neg   Urine pH 6.5   Urine Protein Neg   Urine Urobilinogen 0.2   Urine Nitrites Neg   Urine Leukocyte Esterase Neg    PROCEDURES:         Flexible Cystoscopy - 52000  Indication: Gross hematuria Risks, benefits, and potential complications of the procedure were discussed with the patient including infection, bleeding, voiding discomfort, urinary retention, fever, chills, sepsis, and others. All questions were answered. Informed consent was obtained. Sterile technique and intraurethral analgesia were used.  Meatus:  Normal size. Normal  location. Normal condition.  Urethra:  No strictures.  External Sphincter:  Normal.  Verumontanum:  Verumontanum Surgically Absent.  Prostate:  Prostate Surgically Absent.  Bladder Neck:  Non-obstructing.  Ureteral Orifices:  His ureteral orifices are located medially consistent with his history of a prostatectomy and reconstruction. He also has significant inflammation near the vicinity of the right ureteral orifice which is not easily identified. The right ureteral orifice is also raised. This may be consistent with an underlying mass or stone. No bloody reflux from the right ureter is noted.  Bladder:  No trabeculation. No tumors. Normal mucosa. No stones.      Chaperone: Eddie North The procedure was well-tolerated and without complications. Instructions were given to call the office immediately if questions or problems.         Urinalysis Dipstick Dipstick Cont'd  Color: Yellow Bilirubin: Neg  Appearance: Clear Ketones: Neg  Specific Gravity: 1.020 Blood: Neg  pH: 6.5 Protein: Neg  Glucose: Neg Urobilinogen: 0.2    Nitrites: Neg    Leukocyte Esterase: Neg    ASSESSMENT:      ICD-10 Details  1 GU:   Gross hematuria - R31.0    PLAN:            Medications New Meds: Hydrocodone-Acetaminophen 5 mg-325 mg tablet 1-2 tablet PO  Q 6 H prn   #15  0 Refill(s)            Orders Labs BUN/Creatinine          Schedule X-Rays: 1 Week - C.T. Hematuria With and Without I.V. Contrast  Return Visit/Planned Activity: Keep Scheduled Appointment          Document Letter(s):  Created for Patient: Clinical Summary         Notes:   1. Hematuria: He does not have any concerning findings within his lower urinary tract although does have an inflamed and raised right ureteral orifice concerning for an underlying distal mass or stone. He will proceed with a hematuria protocol CT scan in the near future. He has been provided hydrocodone if he does develop pain over the weekend. I will notify him  of the results and proceed accordingly.   2. Prostate cancer: He will keep his scheduled follow-up for routine surveillance of his prostate cancer.          APPENDED NOTES:  I reviewed Dr. Tery Sanfilippo CT scan and spoke with him this evening. This demonstrates no ureteral stone. However, there is a hyperdense mass at the junction of the right ureteral vesicle junction and bladder. I have recommended that he proceed with cystoscopy, retrograde pyelography, right ureteroscopy with possible biopsy, right ureteral stent placement, and possible bladder biopsy versus transurethral resection of bladder tumor. We have reviewed the potential risks, complications, and expected recovery process. He gives informed consent to proceed. This will be scheduled for the near future.     * Signed by Raynelle Bring, M.D. on 05/27/17 at 6:53 PM (EDT)*

## 2017-06-10 ENCOUNTER — Ambulatory Visit (HOSPITAL_COMMUNITY): Payer: Medicare Other

## 2017-06-10 ENCOUNTER — Ambulatory Visit (HOSPITAL_COMMUNITY)
Admission: RE | Admit: 2017-06-10 | Discharge: 2017-06-10 | Disposition: A | Discharge status: Home / Self Care | Payer: Medicare Other | Source: Ambulatory Visit | Attending: Urology | Admitting: Urology

## 2017-06-10 ENCOUNTER — Encounter (HOSPITAL_COMMUNITY)
Admission: RE | Disposition: A | Discharge status: Home / Self Care | Payer: Self-pay | Source: Ambulatory Visit | Attending: Urology

## 2017-06-10 ENCOUNTER — Ambulatory Visit (HOSPITAL_COMMUNITY): Payer: Medicare Other | Admitting: Anesthesiology

## 2017-06-10 ENCOUNTER — Encounter (HOSPITAL_COMMUNITY): Payer: Self-pay | Admitting: *Deleted

## 2017-06-10 DIAGNOSIS — R310 Gross hematuria: Secondary | ICD-10-CM | POA: Diagnosis present

## 2017-06-10 DIAGNOSIS — N5201 Erectile dysfunction due to arterial insufficiency: Secondary | ICD-10-CM | POA: Diagnosis not present

## 2017-06-10 DIAGNOSIS — Z87891 Personal history of nicotine dependence: Secondary | ICD-10-CM | POA: Insufficient documentation

## 2017-06-10 DIAGNOSIS — N132 Hydronephrosis with renal and ureteral calculous obstruction: Secondary | ICD-10-CM | POA: Diagnosis not present

## 2017-06-10 DIAGNOSIS — Z79899 Other long term (current) drug therapy: Secondary | ICD-10-CM | POA: Insufficient documentation

## 2017-06-10 DIAGNOSIS — D494 Neoplasm of unspecified behavior of bladder: Secondary | ICD-10-CM | POA: Diagnosis not present

## 2017-06-10 DIAGNOSIS — C670 Malignant neoplasm of trigone of bladder: Secondary | ICD-10-CM | POA: Diagnosis not present

## 2017-06-10 DIAGNOSIS — K219 Gastro-esophageal reflux disease without esophagitis: Secondary | ICD-10-CM | POA: Diagnosis not present

## 2017-06-10 DIAGNOSIS — N131 Hydronephrosis with ureteral stricture, not elsewhere classified: Secondary | ICD-10-CM | POA: Insufficient documentation

## 2017-06-10 DIAGNOSIS — Z8546 Personal history of malignant neoplasm of prostate: Secondary | ICD-10-CM | POA: Diagnosis not present

## 2017-06-10 DIAGNOSIS — C679 Malignant neoplasm of bladder, unspecified: Secondary | ICD-10-CM | POA: Diagnosis not present

## 2017-06-10 HISTORY — PX: TRANSURETHRAL RESECTION OF BLADDER TUMOR: SHX2575

## 2017-06-10 HISTORY — PX: CYSTOSCOPY WITH RETROGRADE PYELOGRAM, URETEROSCOPY AND STENT PLACEMENT: SHX5789

## 2017-06-10 SURGERY — CYSTOURETEROSCOPY, WITH RETROGRADE PYELOGRAM AND STENT INSERTION
Anesthesia: General | Laterality: Right

## 2017-06-10 MED ORDER — FENTANYL CITRATE (PF) 100 MCG/2ML IJ SOLN
INTRAMUSCULAR | Status: DC | PRN
  Administered 2017-06-10: 50 ug via INTRAVENOUS
  Administered 2017-06-10 (×3): 25 ug via INTRAVENOUS

## 2017-06-10 MED ORDER — PROPOFOL 10 MG/ML IV BOLUS
INTRAVENOUS | Status: AC
  Filled 2017-06-10: qty 20

## 2017-06-10 MED ORDER — CEFAZOLIN SODIUM-DEXTROSE 2-4 GM/100ML-% IV SOLN
2.0000 g | INTRAVENOUS | Status: AC
  Administered 2017-06-10: 2 g via INTRAVENOUS
  Filled 2017-06-10: qty 100

## 2017-06-10 MED ORDER — LACTATED RINGERS IV SOLN
INTRAVENOUS | Status: DC
  Administered 2017-06-10 (×2): via INTRAVENOUS

## 2017-06-10 MED ORDER — LIDOCAINE 2% (20 MG/ML) 5 ML SYRINGE
INTRAMUSCULAR | Status: DC | PRN
  Administered 2017-06-10: 100 mg via INTRAVENOUS

## 2017-06-10 MED ORDER — DEXAMETHASONE SODIUM PHOSPHATE 10 MG/ML IJ SOLN
INTRAMUSCULAR | Status: DC | PRN
  Administered 2017-06-10: 10 mg via INTRAVENOUS

## 2017-06-10 MED ORDER — INDIGOTINDISULFONATE SODIUM 8 MG/ML IJ SOLN
INTRAMUSCULAR | Status: DC | PRN
  Administered 2017-06-10: 5 mL via INTRAVENOUS

## 2017-06-10 MED ORDER — PHENAZOPYRIDINE HCL 100 MG PO TABS: 100.0000 mg | ORAL_TABLET | Freq: Three times a day (TID) | ORAL | 0 refills | Status: DC | PRN

## 2017-06-10 MED ORDER — INDIGOTINDISULFONATE SODIUM 8 MG/ML IJ SOLN
INTRAMUSCULAR | Status: AC
  Filled 2017-06-10: qty 5

## 2017-06-10 MED ORDER — FENTANYL CITRATE (PF) 100 MCG/2ML IJ SOLN
INTRAMUSCULAR | Status: AC
Start: 2017-06-10 — End: 2017-06-10
  Filled 2017-06-10: qty 2

## 2017-06-10 MED ORDER — SODIUM CHLORIDE 0.9 % IR SOLN
Status: DC | PRN
  Administered 2017-06-10: 10000 mL

## 2017-06-10 MED ORDER — MIDAZOLAM HCL 5 MG/5ML IJ SOLN
INTRAMUSCULAR | Status: DC | PRN
  Administered 2017-06-10: 2 mg via INTRAVENOUS

## 2017-06-10 MED ORDER — LIDOCAINE 2% (20 MG/ML) 5 ML SYRINGE
INTRAMUSCULAR | Status: AC
Start: 2017-06-10 — End: 2017-06-10
  Filled 2017-06-10: qty 5

## 2017-06-10 MED ORDER — FENTANYL CITRATE (PF) 100 MCG/2ML IJ SOLN
INTRAMUSCULAR | Status: AC
  Filled 2017-06-10: qty 2

## 2017-06-10 MED ORDER — ONDANSETRON HCL 4 MG/2ML IJ SOLN
INTRAMUSCULAR | Status: DC | PRN
  Administered 2017-06-10: 4 mg via INTRAVENOUS

## 2017-06-10 MED ORDER — HYDROMORPHONE HCL-NACL 0.5-0.9 MG/ML-% IV SOSY: 0.2500 mg | PREFILLED_SYRINGE | INTRAVENOUS | Status: DC | PRN

## 2017-06-10 MED ORDER — HYDROCODONE-ACETAMINOPHEN 5-325 MG PO TABS: 1.0000 | ORAL_TABLET | Freq: Four times a day (QID) | ORAL | 0 refills | Status: DC | PRN

## 2017-06-10 MED ORDER — PROPOFOL 10 MG/ML IV BOLUS
INTRAVENOUS | Status: DC | PRN
Start: 2017-06-10 — End: 2017-06-10
  Administered 2017-06-10: 150 mg via INTRAVENOUS

## 2017-06-10 MED ORDER — MIDAZOLAM HCL 2 MG/2ML IJ SOLN
INTRAMUSCULAR | Status: AC
  Filled 2017-06-10: qty 2

## 2017-06-10 SURGICAL SUPPLY — 24 items
BAG URINE DRAINAGE (UROLOGICAL SUPPLIES) IMPLANT
BAG URO CATCHER STRL LF (MISCELLANEOUS) ×5 IMPLANT
BASKET ZERO TIP NITINOL 2.4FR (BASKET) IMPLANT
CATH INTERMIT  6FR 70CM (CATHETERS) IMPLANT
CLOTH BEACON ORANGE TIMEOUT ST (SAFETY) ×5 IMPLANT
COVER SURGICAL LIGHT HANDLE (MISCELLANEOUS) ×5 IMPLANT
ELECT REM PT RETURN 15FT ADLT (MISCELLANEOUS) ×5 IMPLANT
FIBER LASER FLEXIVA 365 (UROLOGICAL SUPPLIES) IMPLANT
FIBER LASER TRAC TIP (UROLOGICAL SUPPLIES) IMPLANT
GLOVE BIOGEL M STRL SZ7.5 (GLOVE) ×5 IMPLANT
GOWN STRL REUS W/TWL LRG LVL3 (GOWN DISPOSABLE) ×10 IMPLANT
GUIDEWIRE ANG ZIPWIRE 038X150 (WIRE) IMPLANT
GUIDEWIRE STR DUAL SENSOR (WIRE) ×5 IMPLANT
IV NS 1000ML (IV SOLUTION) ×2
IV NS 1000ML BAXH (IV SOLUTION) ×3 IMPLANT
LOOP CUT BIPOLAR 24F LRG (ELECTROSURGICAL) ×5 IMPLANT
MANIFOLD NEPTUNE II (INSTRUMENTS) ×5 IMPLANT
PACK CYSTO (CUSTOM PROCEDURE TRAY) ×5 IMPLANT
SET ASPIRATION TUBING (TUBING) IMPLANT
SHEATH ACCESS URETERAL 38CM (SHEATH) IMPLANT
STENT URET 6FRX26 CONTOUR (STENTS) ×5 IMPLANT
SYRINGE IRR TOOMEY STRL 70CC (SYRINGE) IMPLANT
TUBING CONNECTING 10 (TUBING) ×4 IMPLANT
TUBING CONNECTING 10' (TUBING) ×1

## 2017-06-10 NOTE — Op Note (Signed)
Preoperative diagnosis: Right ureteral obstruction and pelvic mass  Postoperative diagnosis: Right ureteral obstruction and bladder mass  Procedures: 1.  Cystoscopy 2.  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  Right ureteroscopy 4.  Right ureteral stent placement (6 x 26 - no string)  Surgeon: Kristopher Thompson. M.D.  Anesthesia: General  Complications: None  EBL: Minimal  Specimens: Bladder tumor.  Disposition of specimens: Pathology  Indication: Kristopher Thompson is a 66 year old gentleman with a history of prostate cancer status post radical prostatectomy.  He recent presented with gross hematuria and underwent office cystoscopy revealing a raised area near the trigone to the right side.  This appeared more consistent with an extrinsic or ureteral process rather than an intrinsic bladder process.  A CT scan was performed and demonstrated a soft tissue mass that appeared to be present at the junction of the ureter and the bladder with significant right-sided hydroureteronephrosis.  I therefore recommended further endoscopic evaluation and the above procedures for evaluation.  Informed consent was obtained.  Description of procedure: The patient was taken to the operating room and a general anesthetic with history.  He was given preoperative antibiotics, placed in the dorsal lithotomy position, and prepped and draped in the usual sterile fashion.  A preoperative timeout was performed.  Cystourethroscopy was then performed revealing a normal urethra.  The prostatic urethra was surgically absent.  Inspection of the bladder revealed a raised and friable mass at the trigone extending from the right side toward the midline of the bladder.  The right ureteral orifice was not able to be identified.  The left ureteral orifice was identified and was able to be cannulated 0.038 Sensor guidewire to confirm this.  Indigo carmine was administered and blue reflux from the left ureteral orifice  was identified.  No reflux on the right side was noted.  No other abnormalities were noted within the bladder.  The cystoscope was then removed and replaced with the 26 French resectoscope.  This was placed under direct vision with the visual obturator.  Using loop resection, the noted mass was then carefully resected with bipolar cutting current.  The right ureteral orifice was immediately identified and appeared to be significantly dilated.  The remainder of the mass was then carefully resected with care to avoid the opening to the ureter.  Once all visible mass was resected to a safe depth, hemostasis was achieved with cautery with care not to cauterize near the ureteral orifice.  Once hemostasis was achieved, I then removed the resectoscope and used the 6 French semirigid ureteroscope to advance a wire up the right ureter into the renal pelvis under fluoroscopic guidance.  I then removed the semirigid ureteroscope and replaced it next to the wire and examined the distal and mid ureter.  No intrinsic abnormalities were identified.  The wire was then back loaded over the cystoscope and a 6 x 26 double-J ureteral stent was advanced over the wire using Seldinger technique and positioned under fluoroscopic and cystoscopic guidance.  The wire was removed and good curl noted in the renal pelvis as well as within the bladder.  The patient's bladder was emptied.  He tolerated the procedure well without complications.  He was able to be awakened and transferred to the recovery unit in satisfactory condition.

## 2017-06-10 NOTE — Anesthesia Postprocedure Evaluation (Signed)
Anesthesia Post Note  Patient: Kristopher Thompson, DDS  Procedure(s) Performed: Procedure(s) (LRB): CYSTOSCOPY WITH RIGHT URETEROSCOPY WITH RIGHT STENT PLACEMENT (Right) TRANSURETHRAL RESECTION OF BLADDER TUMOR (TURBT)     Patient location during evaluation: PACU Anesthesia Type: General Level of consciousness: awake and alert Pain management: pain level controlled Vital Signs Assessment: post-procedure vital signs reviewed and stable Respiratory status: spontaneous breathing, nonlabored ventilation, respiratory function stable and patient connected to nasal cannula oxygen Cardiovascular status: blood pressure returned to baseline and stable Postop Assessment: no signs of nausea or vomiting Anesthetic complications: no    Last Vitals:  Vitals:   06/10/17 1809 06/10/17 1849  BP: (!) 150/78 (!) 148/74  Pulse: 60 (!) 57  Resp: 15 16  Temp: 36.6 C (!) 36.4 C    Last Pain:  Vitals:   06/10/17 1849  TempSrc: Oral  PainSc:                  Montez Hageman

## 2017-06-10 NOTE — Anesthesia Preprocedure Evaluation (Signed)
Anesthesia Evaluation  Patient identified by MRN, date of birth, ID band Patient awake    Reviewed: Allergy & Precautions, NPO status , Patient's Chart, lab work & pertinent test results  Airway Mallampati: II  TM Distance: >3 FB     Dental   Pulmonary former smoker,    breath sounds clear to auscultation       Cardiovascular + dysrhythmias  Rhythm:Irregular Rate:Normal     Neuro/Psych    GI/Hepatic Neg liver ROS, hiatal hernia, GERD  ,  Endo/Other  negative endocrine ROS  Renal/GU negative Renal ROS     Musculoskeletal   Abdominal   Peds  Hematology   Anesthesia Other Findings   Reproductive/Obstetrics                             Anesthesia Physical Anesthesia Plan  ASA: III  Anesthesia Plan: General   Post-op Pain Management:    Induction: Intravenous  PONV Risk Score and Plan: 2 and Ondansetron, Dexamethasone and Treatment may vary due to age or medical condition  Airway Management Planned: LMA  Additional Equipment:   Intra-op Plan:   Post-operative Plan: Extubation in OR  Informed Consent: I have reviewed the patients History and Physical, chart, labs and discussed the procedure including the risks, benefits and alternatives for the proposed anesthesia with the patient or authorized representative who has indicated his/her understanding and acceptance.   Dental advisory given  Plan Discussed with: CRNA and Anesthesiologist  Anesthesia Plan Comments:         Anesthesia Quick Evaluation

## 2017-06-10 NOTE — Transfer of Care (Signed)
Immediate Anesthesia Transfer of Care Note  Patient: Kristopher Thompson, DDS  Procedure(s) Performed: Procedure(s): CYSTOSCOPY WITH RIGHT URETEROSCOPY WITH RIGHT STENT PLACEMENT (Right) TRANSURETHRAL RESECTION OF BLADDER TUMOR (TURBT)  Patient Location: PACU  Anesthesia Type:General  Level of Consciousness:  sedated, patient cooperative and responds to stimulation  Airway & Oxygen Therapy:Patient Spontanous Breathing and Patient connected to face mask oxgen  Post-op Assessment:  Report given to PACU RN and Post -op Vital signs reviewed and stable  Post vital signs:  Reviewed and stable  Last Vitals:  Vitals:   06/10/17 1411  BP: 136/80  Pulse: 75  Resp: 18  Temp: 92.9 C    Complications: No apparent anesthesia complications

## 2017-06-10 NOTE — Interval H&P Note (Signed)
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 After consideration of risks, benefits and other options for treatment, the patient has consented to  Procedure(s): CYSTOSCOPY WITH BILATERAL RETROGRADE PYELOGRAM, RIGHT URETEROSCOPY WITH POSSIBLE BIOPSY AND RIGHT STENT PLACEMENT (Bilateral) CYSTOSCOPY WITH BIOPSY (N/A) as a surgical intervention .  The patient's history has been reviewed, patient examined, no change in status, stable for surgery.  I have reviewed the patient's chart and labs.  Questions were answered to the patient's satisfaction.     Lamontae Ricardo,LES

## 2017-06-10 NOTE — Anesthesia Procedure Notes (Signed)
Procedure Name: LMA Insertion Date/Time: 06/10/2017 4:29 PM Performed by: Lind Covert Pre-anesthesia Checklist: Patient identified, Emergency Drugs available, Suction available and Patient being monitored Patient Re-evaluated:Patient Re-evaluated prior to induction Oxygen Delivery Method: Circle system utilized Preoxygenation: Pre-oxygenation with 100% oxygen Induction Type: IV induction Ventilation: Mask ventilation without difficulty LMA Size: 5.0 Number of attempts: 1 Dental Injury: Teeth and Oropharynx as per pre-operative assessment

## 2017-06-10 NOTE — Discharge Instructions (Addendum)
1. You may see some blood in the urine and may have some burning with urination for 48-72 hours. You also may notice that you have to urinate more frequently or urgently after your procedure which is normal.  °2. You should call should you develop an inability urinate, fever > 101, persistent nausea and vomiting that prevents you from eating or drinking to stay hydrated.  °3. If you have a stent, you will likely urinate more frequently and urgently until the stent is removed and you may experience some discomfort/pain in the lower abdomen and flank especially when urinating. You may take pain medication prescribed to you if needed for pain. You may also intermittently have blood in the urine until the stent is removed. ° ° ° °General Anesthesia, Adult, Care After °These instructions provide you with information about caring for yourself after your procedure. Your health care provider may also give you more specific instructions. Your treatment has been planned according to current medical practices, but problems sometimes occur. Call your health care provider if you have any problems or questions after your procedure. °What can I expect after the procedure? °After the procedure, it is common to have: °· Vomiting. °· A sore throat. °· Mental slowness. ° °It is common to feel: °· Nauseous. °· Cold or shivery. °· Sleepy. °· Tired. °· Sore or achy, even in parts of your body where you did not have surgery. ° °Follow these instructions at home: °For at least 24 hours after the procedure: °· Do not: °? Participate in activities where you could fall or become injured. °? Drive. °? Use heavy machinery. °? Drink alcohol. °? Take sleeping pills or medicines that cause drowsiness. °? Make important decisions or sign legal documents. °? Take care of children on your own. °· Rest. °Eating and drinking °· If you vomit, drink water, juice, or soup when you can drink without vomiting. °· Drink enough fluid to keep your urine clear  or pale yellow. °· Make sure you have little or no nausea before eating solid foods. °· Follow the diet recommended by your health care provider. °General instructions °· Have a responsible adult stay with you until you are awake and alert. °· Return to your normal activities as told by your health care provider. Ask your health care provider what activities are safe for you. °· Take over-the-counter and prescription medicines only as told by your health care provider. °· If you smoke, do not smoke without supervision. °· Keep all follow-up visits as told by your health care provider. This is important. °Contact a health care provider if: °· You continue to have nausea or vomiting at home, and medicines are not helpful. °· You cannot drink fluids or start eating again. °· You cannot urinate after 8-12 hours. °· You develop a skin rash. °· You have fever. °· You have increasing redness at the site of your procedure. °Get help right away if: °· You have difficulty breathing. °· You have chest pain. °· You have unexpected bleeding. °· You feel that you are having a life-threatening or urgent problem. °This information is not intended to replace advice given to you by your health care provider. Make sure you discuss any questions you have with your health care provider. °Document Released: 02/04/2001 Document Revised: 04/02/2016 Document Reviewed: 10/13/2015 °Elsevier Interactive Patient Education © 2018 [employer] ° ° ° °

## 2017-06-11 ENCOUNTER — Encounter (HOSPITAL_COMMUNITY): Payer: Self-pay | Admitting: Urology

## 2017-06-20 DIAGNOSIS — L821 Other seborrheic keratosis: Secondary | ICD-10-CM | POA: Diagnosis not present

## 2017-06-20 DIAGNOSIS — Z85828 Personal history of other malignant neoplasm of skin: Secondary | ICD-10-CM | POA: Diagnosis not present

## 2017-06-20 DIAGNOSIS — L820 Inflamed seborrheic keratosis: Secondary | ICD-10-CM | POA: Diagnosis not present

## 2017-06-20 DIAGNOSIS — D692 Other nonthrombocytopenic purpura: Secondary | ICD-10-CM | POA: Diagnosis not present

## 2017-06-20 DIAGNOSIS — D1801 Hemangioma of skin and subcutaneous tissue: Secondary | ICD-10-CM | POA: Diagnosis not present

## 2017-06-21 DIAGNOSIS — C678 Malignant neoplasm of overlapping sites of bladder: Secondary | ICD-10-CM | POA: Diagnosis not present

## 2017-06-21 DIAGNOSIS — R310 Gross hematuria: Secondary | ICD-10-CM | POA: Diagnosis not present

## 2017-06-25 DIAGNOSIS — R918 Other nonspecific abnormal finding of lung field: Secondary | ICD-10-CM | POA: Diagnosis not present

## 2017-06-25 DIAGNOSIS — I700 Atherosclerosis of aorta: Secondary | ICD-10-CM | POA: Diagnosis not present

## 2017-06-25 DIAGNOSIS — C678 Malignant neoplasm of overlapping sites of bladder: Secondary | ICD-10-CM | POA: Diagnosis not present

## 2017-07-08 ENCOUNTER — Other Ambulatory Visit: Payer: Self-pay | Admitting: General Surgery

## 2017-07-08 DIAGNOSIS — Z08 Encounter for follow-up examination after completed treatment for malignant neoplasm: Secondary | ICD-10-CM | POA: Diagnosis not present

## 2017-07-08 DIAGNOSIS — C675 Malignant neoplasm of bladder neck: Secondary | ICD-10-CM | POA: Diagnosis not present

## 2017-07-16 ENCOUNTER — Other Ambulatory Visit: Payer: Self-pay | Admitting: Urology

## 2017-07-16 DIAGNOSIS — C61 Malignant neoplasm of prostate: Secondary | ICD-10-CM | POA: Diagnosis not present

## 2017-07-16 DIAGNOSIS — C678 Malignant neoplasm of overlapping sites of bladder: Secondary | ICD-10-CM | POA: Diagnosis not present

## 2017-07-16 DIAGNOSIS — R310 Gross hematuria: Secondary | ICD-10-CM | POA: Diagnosis not present

## 2017-08-01 NOTE — Patient Instructions (Signed)
Kristopher Thompson, Kristopher Thompson  08/01/2017   Your procedure is scheduled on: 08-08-17  Report to [facility] Main  Entrance Take [facility]  elevators to 3rd floor to  [place] [facility].   Call this number if you have problems the morning of surgery [phone]   Remember: ONLY 1 PERSON MAY GO WITH YOU TO SHORT STAY TO GET  READY MORNING OF YOUR SURGERY.  Do not eat food After Midnight on Tuesday 08-06-17. Drink plenty of clear liquids all day Wednesday 08-07-17 and follow all mechanical prep instructions from your surgeon. Nothing by mouth after midnight on Wednesday!     Take these medicines the morning of surgery with A SIP OF WATER: tylenol as needed, nexium as needed                                You may not have any metal on your body including hair pins and              piercings  Do not wear jewelry, make-up, lotions, powders or perfumes, deodorant              Men may shave face and neck.   Do not bring valuables to the hospital. [place].  Contacts, dentures or bridgework may not be worn into surgery.  Leave suitcase in the car. After surgery it may be brought to your room.               Please read over the following fact sheets you were given: _____________________________________________________________________    CLEAR LIQUID DIET   Foods Allowed                                                                     Foods Excluded  Coffee and tea, regular and decaf                             liquids that you cannot  Plain Jell-O in any flavor                                             see through such as: Fruit ices (not with fruit pulp)                                     milk, soups, orange juice  Iced Popsicles                                    All solid food Carbonated beverages, regular and diet  Cranberry, grape and apple juices Sports drinks like  Gatorade Lightly seasoned clear broth or consume(fat free) Sugar, honey syrup  Sample Menu Breakfast                                Lunch                                     Supper Cranberry juice                    Beef broth                            Chicken broth Jell-O                                     Grape juice                           Apple juice Coffee or tea                        Jell-O                                      Popsicle                                                Coffee or tea                        Coffee or tea  _____________________________________________________________________           [facility] - Preparing for Surgery Before surgery, you can play an important role.  Because skin is not sterile, your skin needs to be as free of germs as possible.  You can reduce the number of germs on your skin by washing with CHG (chlorahexidine gluconate) soap before surgery.  CHG is an antiseptic cleaner which kills germs and bonds with the skin to continue killing germs even after washing. Please DO NOT use if you have an allergy to CHG or antibacterial soaps.  If your skin becomes reddened/irritated stop using the CHG and inform your nurse when you arrive at Short Stay. Do not shave (including legs and underarms) for at least 48 hours prior to the first CHG shower.  You may shave your face/neck. Please follow these instructions carefully:  1.  Shower with CHG Soap the night before surgery and the  morning of Surgery.  2.  If you choose to wash your hair, wash your hair first as usual with your  normal  shampoo.  3.  After you shampoo, rinse your hair and body thoroughly to remove the  shampoo.                           4.  Use CHG as you would any other liquid soap.  You can apply chg directly  to the skin and wash                       Gently with a scrungie or clean washcloth.  5.  Apply the CHG Soap to your body ONLY FROM THE NECK DOWN.   Do not use on face/ open                            Wound or open sores. Avoid contact with eyes, ears mouth and genitals (private parts).                       Wash face,  Genitals (private parts) with your normal soap.             6.  Wash thoroughly, paying special attention to the area where your surgery  will be performed.  7.  Thoroughly rinse your body with warm water from the neck down.  8.  DO NOT shower/wash with your normal soap after using and rinsing off  the CHG Soap.                9.  Pat yourself dry with a clean towel.            10.  Wear clean pajamas.            11.  Place clean sheets on your bed the night of your first shower and do not  sleep with pets. Day of Surgery : Do not apply any lotions/deodorants the morning of surgery.  Please wear clean clothes to the hospital/surgery center.  FAILURE TO FOLLOW THESE INSTRUCTIONS MAY RESULT IN THE CANCELLATION OF YOUR SURGERY PATIENT SIGNATURE_________________________________  NURSE SIGNATURE__________________________________  ________________________________________________________________________   Kristopher Thompson  An incentive spirometer is a tool that can help keep your lungs clear and active. This tool measures how well you are filling your lungs with each breath. Taking long deep breaths may help reverse or decrease the chance of developing breathing (pulmonary) problems (especially infection) following:  A long period of time when you are unable to move or be active. BEFORE THE PROCEDURE   If the spirometer includes an indicator to show your best effort, your nurse or respiratory therapist will set it to a desired goal.  If possible, sit up straight or lean slightly forward. Try not to slouch.  Hold the incentive spirometer in an upright position. INSTRUCTIONS FOR USE  1. Sit on the edge of your bed if possible, or sit up as far as you can in bed or on a chair. 2. Hold the incentive spirometer in an upright position. 3. Breathe  out normally. 4. Place the mouthpiece in your mouth and seal your lips tightly around it. 5. Breathe in slowly and as deeply as possible, raising the piston or the ball toward the top of the column. 6. Hold your breath for 3-5 seconds or for as long as possible. Allow the piston or ball to fall to the bottom of the column. 7. Remove the mouthpiece from your mouth and breathe out normally. 8. Rest for a few seconds and repeat Steps 1 through 7 at least 10 times every 1-2 hours when you are awake. Take your time and take a few normal breaths between deep breaths. 9. The spirometer may include an indicator to show your best effort. Use the indicator as a goal to work toward during each repetition. 10. After  each set of 10 deep breaths, practice coughing to be sure your lungs are clear. If you have an incision (the cut made at the time of surgery), support your incision when coughing by placing a pillow or rolled up towels firmly against it. Once you are able to get out of bed, walk around indoors and cough well. You may stop using the incentive spirometer when instructed by your caregiver.  RISKS AND COMPLICATIONS  Take your time so you do not get dizzy or light-headed.  If you are in pain, you may need to take or ask for pain medication before doing incentive spirometry. It is harder to take a deep breath if you are having pain. AFTER USE  Rest and breathe slowly and easily.  It can be helpful to keep track of a log of your progress. Your caregiver can provide you with a simple table to help with this. If you are using the spirometer at home, follow these instructions: [place] IF:   You are having difficultly using the spirometer.  You have trouble using the spirometer as often as instructed.  Your pain medication is not giving enough relief while using the spirometer.  You develop fever of 100.5 F (38.1 C) or higher. SEEK IMMEDIATE MEDICAL CARE IF:   You cough up bloody  sputum that had not been present before.  You develop fever of 102 F (38.9 C) or greater.  You develop worsening pain at or near the incision site. MAKE SURE YOU:   Understand these instructions.  Will watch your condition.  Will get help right away if you are not doing well or get worse. Document Released: 03/11/2007 Document Revised: 01/21/2012 Document Reviewed: 05/12/2007 ExitCare Patient Information 2014 ExitCare, [place].   ________________________________________________________________________  WHAT IS A BLOOD TRANSFUSION? Blood Transfusion Information  A transfusion is the replacement of blood or some of its parts. Blood is made up of multiple cells which provide different functions.  Red blood cells carry oxygen and are used for blood loss replacement.  White blood cells fight against infection.  Platelets control bleeding.  Plasma helps clot blood.  Other blood products are available for specialized needs, such as hemophilia or other clotting disorders. BEFORE THE TRANSFUSION  Who gives blood for transfusions?   Healthy volunteers who are fully evaluated to make sure their blood is safe. This is blood bank blood. Transfusion therapy is the safest it has ever been in the practice of medicine. Before blood is taken from a donor, a complete history is taken to make sure that person has no history of diseases nor engages in risky social behavior (examples are intravenous drug use or sexual activity with multiple partners). The donor's travel history is screened to minimize risk of transmitting infections, such as malaria. The donated blood is tested for signs of infectious diseases, such as HIV and hepatitis. The blood is then tested to be sure it is compatible with you in order to minimize the chance of a transfusion reaction. If you or a relative donates blood, this is often done in anticipation of surgery and is not appropriate for emergency situations. It takes many days  to process the donated blood. RISKS AND COMPLICATIONS Although transfusion therapy is very safe and saves many lives, the main dangers of transfusion include:   Getting an infectious disease.  Developing a transfusion reaction. This is an allergic reaction to something in the blood you were given. Every precaution is taken to prevent this. The decision to  have a blood transfusion has been considered carefully by your caregiver before blood is given. Blood is not given unless the benefits outweigh the risks. AFTER THE TRANSFUSION  Right after receiving a blood transfusion, you will usually feel much better and more energetic. This is especially true if your red blood cells have gotten low (anemic). The transfusion raises the level of the red blood cells which carry oxygen, and this usually causes an energy increase.  The nurse administering the transfusion will monitor you carefully for complications. HOME CARE INSTRUCTIONS  No special instructions are needed after a transfusion. You may find your energy is better. Speak with your caregiver about any limitations on activity for underlying diseases you may have. SEEK MEDICAL CARE IF:   Your condition is not improving after your transfusion.  You develop redness or irritation at the intravenous (IV) site. SEEK IMMEDIATE MEDICAL CARE IF:  Any of the following symptoms occur over the next 12 hours:  Shaking chills.  You have a temperature by mouth above 102 F (38.9 C), not controlled by medicine.  Chest, back, or muscle pain.  People around you feel you are not acting correctly or are confused.  Shortness of breath or difficulty breathing.  Dizziness and fainting.  You get a rash or develop hives.  You have a decrease in urine output.  Your urine turns a dark color or changes to pink, red, or brown. Any of the following symptoms occur over the next 10 days:  You have a temperature by mouth above 102 F (38.9 C), not controlled  by medicine.  Shortness of breath.  Weakness after normal activity.  The white part of the eye turns yellow (jaundice).  You have a decrease in the amount of urine or are urinating less often.  Your urine turns a dark color or changes to pink, red, or brown. Document Released: 10/26/2000 Document Revised: 01/21/2012 Document Reviewed: 06/14/2008 [facility] Patient Information 2014 [place], [place].  _______________________________________________________________________

## 2017-08-02 ENCOUNTER — Encounter (HOSPITAL_COMMUNITY): Payer: Self-pay

## 2017-08-02 ENCOUNTER — Encounter (HOSPITAL_COMMUNITY)
Admission: RE | Admit: 2017-08-02 | Discharge: 2017-08-02 | Disposition: A | Discharge status: Home / Self Care | Payer: Medicare Other | Source: Ambulatory Visit | Attending: Urology | Admitting: Urology

## 2017-08-02 DIAGNOSIS — Z01810 Encounter for preprocedural cardiovascular examination: Secondary | ICD-10-CM | POA: Diagnosis not present

## 2017-08-02 DIAGNOSIS — C679 Malignant neoplasm of bladder, unspecified: Secondary | ICD-10-CM | POA: Diagnosis not present

## 2017-08-02 DIAGNOSIS — Z01812 Encounter for preprocedural laboratory examination: Secondary | ICD-10-CM | POA: Diagnosis not present

## 2017-08-02 DIAGNOSIS — I4510 Unspecified right bundle-branch block: Secondary | ICD-10-CM | POA: Diagnosis not present

## 2017-08-02 LAB — CBC
HCT: 44.7 % (ref 39.0–52.0)
HEMOGLOBIN: 15.7 g/dL (ref 13.0–17.0)
MCH: 29.2 pg (ref 26.0–34.0)
MCHC: 35.1 g/dL (ref 30.0–36.0)
MCV: 83.2 fL (ref 78.0–100.0)
Platelets: 131 10*3/uL — ABNORMAL LOW (ref 150–400)
RBC: 5.37 MIL/uL (ref 4.22–5.81)
RDW: 13.4 % (ref 11.5–15.5)
WBC: 7.2 10*3/uL (ref 4.0–10.5)

## 2017-08-02 LAB — BASIC METABOLIC PANEL
ANION GAP: 7 (ref 5–15)
BUN: 15 mg/dL (ref 6–20)
CALCIUM: 9 mg/dL (ref 8.9–10.3)
CO2: 26 mmol/L (ref 22–32)
Chloride: 108 mmol/L (ref 101–111)
Creatinine, Ser: 0.92 mg/dL (ref 0.61–1.24)
Glucose, Bld: 89 mg/dL (ref 65–99)
POTASSIUM: 4.1 mmol/L (ref 3.5–5.1)
Sodium: 141 mmol/L (ref 135–145)

## 2017-08-02 NOTE — Consult Note (Addendum)
[place] Nurse requested for preoperative stoma site marking for urostomy and possible colostomy  Discussed surgical procedure and stoma creation with patient.  Explained role of the [place] nurse team.  Provided the patient with educational booklet and provided samples of pouching options.  Answered patient's questions.   Examined patient sitting, and standing in order to place the marking in the patient's visual field, away from any creases or abdominal contour issues and within the rectus muscle.  Attempted to mark below the patient's belt line, but this was not possible since a significant skin fold occurs when the patient sits forward and should be avoided if possible.   Marked for colostomy in the LLQ  _10___ cm to the left of the umbilicus and _7_EM below the umbilicus.   Marked for ileal conduit in the RLQ __10__  cm to the right of the umbilicus and  __7__ cm below the umbilicus.   Patient's abdomen cleansed with CHG wipes at site markings, allowed to air dry prior to marking.Covered mark with thin film transparent dressing to preserve mark until date of surgery.   [place] Nurse team will follow up with patient after surgery for continue ostomy care and teaching.  Julien Girt MSN, RN, [place], [place], [place]

## 2017-08-03 LAB — ABO/RH: ABO/RH(D): AB POS

## 2017-08-07 NOTE — H&P (Signed)
Office Visit Report     07/16/2017   --------------------------------------------------------------------------------   Kristopher Thompson. Kristopher Thompson  MRN: [id number]  PRIMARY CARE:  Floyde Parkins, MD  DOB: 01/05/1951, 66 year old Male  REFERRING:  Luvenia Redden  SSN: -[id number]  PROVIDER:  Raynelle Bring, M.D.    LOCATION:  [facility] [phone]   --------------------------------------------------------------------------------   CC/HPI: Muscle invasive bladder cancer   Dr. Meredith Staggers follows up today for further discussion in preparation for his upcoming planned radical cystectomy and ileal conduit urinary diversion. He was noted to have a muscle invasive poorly differentiated tumor at the trigone causing right ureteral obstruction. Further review of his pathology indicated that this may be a primary adenocarcinoma. PSA stains were negative. He recently was evaluated by Dr. Leighton Ruff and underwent flexible sigmoidoscopy with no primary rectal tumor noted. This case has been discussed in the [facility] multidisciplinary tumor board and was recommended for him to proceed with primary surgical therapy. His staging studies have been unremarkable although his CT scan of the chest did demonstrate small bilateral, nonspecific pulmonary nodules that will require follow-up but were not highly suspicious for metastatic disease.   He has been tolerating his right ureteral stent relatively well with expected urinary frequency but without other problems or side effects. He has had discussions with his wife regarding his choice for urinary diversion and has ultimately elected to proceed with an ileal conduit.   Notably, he has an extensive prior surgical history including an upper midline incision from a prior cholecystectomy. He has had a prior appendectomy. He also has undergone a prior robotic prostatectomy.     ALLERGIES: No Allergies    MEDICATIONS: Cialis 5 mg tablet TAKE ONE TABLET  BY MOUTH ONCE DAILY AS DIRECTED  Cialis 5 mg tablet TAKE ONE TABLET BY MOUTH ONCE DAILY AS DIRECTED  Hydrocodone-Acetaminophen 5 mg-325 mg tablet 1-2 tablet PO Q 6 H prn  PriLOSEC 20 MG Oral Capsule Delayed Release Oral  Vitamin D TABS Oral     GU PSH: Cystoscopy - 05/10/2017 Cystoscopy Insert Stent, Right - 06/10/2017 Cystoscopy TURBT 2-5 cm - 06/10/2017 Cystoscopy Ureteroscopy, Right - 06/10/2017 Locm 300-399Mg /Ml Iodine,1Ml - 06/25/2017, 05/24/2017 Prostate Needle Biopsy - 2013 Robotic Radical Prostatectomy - 2013      PSH Notes: Prostatect Retropubic Radical W/ Nerve Sparing Laparoscopic, Biopsy Of The Prostate Needle, Gallbladder Surgery, Appendectomy   NON-GU PSH: Appendectomy - 2013    GU PMH: Bladder Cancer overlapping sites - 06/21/2017 Gross hematuria - 05/10/2017 ED due to arterial insufficiency, Erectile dysfunction due to arterial insufficiency - 07/25/2015 Prostate Cancer, Adenocarcinoma of prostate - 07/25/2015 Stress Incontinence, Male stress incontinence - 07/25/2015      PMH Notes: 1) Prostate cancer: he is s/p a BNS RAL radical prostatectomy on 10/02/12. His PSA has been undetectable since surgery.   Diagnosis: pT2c Nx Mx, Gleason 3+4=7 adenocarcinoma with negative surgical margins  Pretreatment PSA: 4.1  Pretreatment SHIM: 9   2) Bladder cancer: He developed gross hematuria and was found to have a bladder mass on cystoscopy. He underwent TURBT and was found to have an apparent primary adenocarcinoma of the bladder.   Jul 2018: TURBT - Muscle invasive adenocarcinoma of the bladder   3) Right ureteral obstruction: He did develop right ureteral obstruction due to his bladder tumor. A right ureteral stent was placed in July 2018.   NON-GU PMH: GERD    FAMILY HISTORY: Prostate Cancer - Runs In Family Pulmonary hypertension, mild -  Runs In Family Renal Cell Carcinoma - Runs In Family   SOCIAL HISTORY: Marital Status: Married Preferred Language: English; Ethnicity:  Not Hispanic Or Latino; Race: White Current Smoking Status: Patient does not smoke anymore. Has not smoked since 04/12/1977.   Tobacco Use Assessment Completed: Used Tobacco in last 30 days? Does drink.  Drinks 3 caffeinated drinks per day.     Notes: Former smoker, Environmental consultant In Usual Daily Activities, Living Independently With Spouse, Activities Of Daily Living, Exercise Habits, Marital History - Currently Married, Alcohol Use   REVIEW OF SYSTEMS:    GU Review Male:   Patient reports leakage of urine and frequent urination. Patient denies hard to postpone urination, stream starts and stops, burning/ pain with urination, get up at night to urinate, have to strain to urinate , and trouble starting your streams.  Gastrointestinal (Lower):   Patient denies diarrhea and constipation.  Gastrointestinal (Upper):   Patient denies nausea and vomiting.  Constitutional:   Patient denies fever, night sweats, weight loss, and fatigue.  Skin:   Patient denies skin rash/ lesion and itching.  Eyes:   Patient denies blurred vision and double vision.  Ears/ Nose/ Throat:   Patient denies sore throat and sinus problems.  Hematologic/Lymphatic:   Patient denies swollen glands and easy bruising.  Cardiovascular:   Patient denies leg swelling and chest pains.  Respiratory:   Patient denies cough and shortness of breath.  Endocrine:   Patient denies excessive thirst.  Musculoskeletal:   Patient denies back pain and joint pain.  Neurological:   Patient denies headaches and dizziness.  Psychologic:   Patient denies depression and anxiety.   VITAL SIGNS:      07/16/2017 08:39 AM  Weight 260 lb / 117.93 kg  Height 72 in / 182.88 cm  BP 134/86 mmHg  Pulse 77 /min  BMI 35.3 kg/m   MULTI-SYSTEM PHYSICAL EXAMINATION:    Constitutional: Well-nourished. No physical deformities. Normally developed. Good grooming.  Neck: Neck symmetrical, not swollen. Normal tracheal position.  Respiratory: No labored breathing,  no use of accessory muscles. Clear bilaterally.  Cardiovascular: Normal temperature, normal extremity pulses, no swelling, no varicosities. Regular rate and rhythm.  Lymphatic: No enlargement of neck, axillae, groin.  Skin: No paleness, no jaundice, no cyanosis. No lesion, no ulcer, no rash.  Neurologic / Psychiatric: Oriented to time, oriented to place, oriented to person. No depression, no anxiety, no agitation.  Gastrointestinal: His abdomen is mildly obese. He has a large well-healed midline incision in the upper midline. He has smaller port sites from his prior prostatectomy.  Eyes: Normal conjunctivae. Normal eyelids.  Ears, Nose, Mouth, and Throat: Left ear no scars, no lesions, no masses. Right ear no scars, no lesions, no masses. Nose no scars, no lesions, no masses. Normal hearing. Normal lips.  Musculoskeletal: Normal gait and station of head and neck.     PAST DATA REVIEWED:  Source Of History:  Patient  Urine Test Review:   Urinalysis   08/24/16 08/06/15 01/14/15 05/08/14 10/24/13 04/04/13 12/03/12  PSA  Total PSA < 0.02 ng/dl <0.01  <0.01  <0.01  <0.01  <0.01  <0.01    Notes:                     Urine will be cultured.   PROCEDURES:          Urinalysis w/Scope Dipstick Dipstick Cont'd Micro  Color: Yellow Bilirubin: Neg WBC/hpf: 0 - 5/hpf  Appearance: Cloudy Ketones: Neg RBC/hpf: 10 -  20/hpf  Specific Gravity: 1.015 Blood: 2+ Bacteria: Rare (0-9/hpf)  pH: 6.5 Protein: Neg Cystals: NS (Not Seen)  Glucose: Neg Urobilinogen: 0.2 Casts: NS (Not Seen)    Nitrites: Neg Trichomonas: Not Present    Leukocyte Esterase: 1+ Mucous: Not Present      Epithelial Cells: 0 - 5/hpf      Yeast: NS (Not Seen)      Sperm: Not Present    ASSESSMENT:      ICD-10 Details  1 GU:   Bladder Cancer overlapping sites - C67.8   2   Gross hematuria - R31.0   3   Prostate Cancer - C61    PLAN:           Orders Labs Urine Culture, PSA, CBC with Diff, CMP          Schedule Return  Visit/Planned Activity: Keep Scheduled Appointment          Document Letter(s):  Created for Patient: Clinical Summary         Notes:   1. Muscle invasive bladder cancer: We had a long discussion today regarding his situation. He does not appear to have any evidence of a primary rectal tumor based on his recent evaluation by Dr. Marcello Moores. He does wish to proceed with primary surgical therapy with a radical cystectomy, bilateral pelvic lymphadenectomy, and ileal conduit urinary diversion.   We have reviewed this procedure in detail. We specifically discussed some of the potential risks related to his prior surgical history. He understands that we would attempt to perform this with robot assisted laparoscopic surgery but that he would be at an increased risk for open surgical conversion. He understands the significant risks for potentially requiring rectal resection/repair or colostomy diversion based on the location of his tumor and the possibility that his posterior bladder may be significantly scarred and adhesed to the anterior rectum.   We discussed the potential risks and complications of radical cystectomy including but not limited to risk of recurrence, failure to cure, bleeding, infection, major cardiopulmonary risks associated with general anesthesia, DVT/PE, vascular or neurologic injury, rectal or bowel injury, etc.   We discussed the potential risks and complications of pelvic lymphadenectomy. He did not require a pelvic lymphadenectomy with his initial primary tumor for his prostate cancer. He understands the risks may include major vascular or neurologic injury, lymphocele formation, etc.   We discussed the potential risks and complications of ileal conduit urinary diversion including bowel obstruction, ileus, bowel leak, urine leak, stomal stenosis, urinary tract infection, need for repeat procedures, urinary tract obstruction, metabolic abnormalities, etc.   He is prepared to proceed as  described above. This procedure is to be arranged with Dr. Marcello Moores in case he does require rectal resection. He understands that he will require long-term surveillance and may require adjuvant therapy pending his pathology.    2. Prostate cancer: His PSA will be checked today considering that he is just about due for this and to absolutely rule out concern for recurrent prostate cancer.   Cc: Dr. Lorene Dy  Dr. Leighton Ruff    E & M CODE: I spent at least 52 minutes face to face with the patient, more than 50% of that time was spent on counseling and/or coordinating care.     * Signed by Raynelle Bring, M.D. on 07/16/17 at 5:34 PM (EDT)*

## 2017-08-08 ENCOUNTER — Encounter (HOSPITAL_COMMUNITY): Payer: Self-pay | Admitting: *Deleted

## 2017-08-08 ENCOUNTER — Inpatient Hospital Stay (HOSPITAL_COMMUNITY): Payer: Medicare Other

## 2017-08-08 ENCOUNTER — Encounter (HOSPITAL_COMMUNITY)
Admission: RE | Disposition: A | Discharge status: Home Health | Payer: Self-pay | Source: Ambulatory Visit | Attending: Urology

## 2017-08-08 ENCOUNTER — Inpatient Hospital Stay (HOSPITAL_COMMUNITY): Payer: Medicare Other | Admitting: Anesthesiology

## 2017-08-08 ENCOUNTER — Inpatient Hospital Stay (HOSPITAL_COMMUNITY)
Admission: RE | Admit: 2017-08-08 | Discharge: 2017-08-14 | DRG: 653 | Disposition: A | Discharge status: Home Health | Payer: Medicare Other | Source: Ambulatory Visit | Attending: Urology | Admitting: Urology

## 2017-08-08 DIAGNOSIS — K660 Peritoneal adhesions (postprocedural) (postinfection): Secondary | ICD-10-CM | POA: Diagnosis not present

## 2017-08-08 DIAGNOSIS — C678 Malignant neoplasm of overlapping sites of bladder: Secondary | ICD-10-CM | POA: Diagnosis not present

## 2017-08-08 DIAGNOSIS — Z87891 Personal history of nicotine dependence: Secondary | ICD-10-CM | POA: Diagnosis not present

## 2017-08-08 DIAGNOSIS — Z933 Colostomy status: Secondary | ICD-10-CM | POA: Diagnosis not present

## 2017-08-08 DIAGNOSIS — K567 Ileus, unspecified: Secondary | ICD-10-CM | POA: Diagnosis not present

## 2017-08-08 DIAGNOSIS — E669 Obesity, unspecified: Secondary | ICD-10-CM

## 2017-08-08 DIAGNOSIS — S3663XA Laceration of rectum, initial encounter: Secondary | ICD-10-CM | POA: Diagnosis not present

## 2017-08-08 DIAGNOSIS — Z23 Encounter for immunization: Secondary | ICD-10-CM | POA: Diagnosis not present

## 2017-08-08 DIAGNOSIS — Z936 Other artificial openings of urinary tract status: Secondary | ICD-10-CM

## 2017-08-08 DIAGNOSIS — Z466 Encounter for fitting and adjustment of urinary device: Secondary | ICD-10-CM | POA: Diagnosis not present

## 2017-08-08 DIAGNOSIS — R000 Tachycardia, unspecified: Secondary | ICD-10-CM | POA: Diagnosis not present

## 2017-08-08 DIAGNOSIS — S31615A Laceration without foreign body of abdominal wall, periumbilic region with penetration into peritoneal cavity, initial encounter: Secondary | ICD-10-CM | POA: Diagnosis not present

## 2017-08-08 DIAGNOSIS — Y92234 Operating room of hospital as the place of occurrence of the external cause: Secondary | ICD-10-CM | POA: Diagnosis not present

## 2017-08-08 DIAGNOSIS — Z79899 Other long term (current) drug therapy: Secondary | ICD-10-CM

## 2017-08-08 DIAGNOSIS — C679 Malignant neoplasm of bladder, unspecified: Secondary | ICD-10-CM | POA: Diagnosis present

## 2017-08-08 DIAGNOSIS — N135 Crossing vessel and stricture of ureter without hydronephrosis: Secondary | ICD-10-CM | POA: Diagnosis present

## 2017-08-08 DIAGNOSIS — Z6836 Body mass index (BMI) 36.0-36.9, adult: Secondary | ICD-10-CM | POA: Diagnosis not present

## 2017-08-08 DIAGNOSIS — K219 Gastro-esophageal reflux disease without esophagitis: Secondary | ICD-10-CM | POA: Diagnosis present

## 2017-08-08 DIAGNOSIS — K9189 Other postprocedural complications and disorders of digestive system: Secondary | ICD-10-CM | POA: Diagnosis not present

## 2017-08-08 DIAGNOSIS — C670 Malignant neoplasm of trigone of bladder: Secondary | ICD-10-CM | POA: Diagnosis not present

## 2017-08-08 DIAGNOSIS — C675 Malignant neoplasm of bladder neck: Secondary | ICD-10-CM | POA: Diagnosis not present

## 2017-08-08 DIAGNOSIS — Z8546 Personal history of malignant neoplasm of prostate: Secondary | ICD-10-CM

## 2017-08-08 DIAGNOSIS — Y848 Other medical procedures as the cause of abnormal reaction of the patient, or of later complication, without mention of misadventure at the time of the procedure: Secondary | ICD-10-CM | POA: Diagnosis not present

## 2017-08-08 DIAGNOSIS — C676 Malignant neoplasm of ureteric orifice: Secondary | ICD-10-CM | POA: Diagnosis not present

## 2017-08-08 DIAGNOSIS — Z5331 Laparoscopic surgical procedure converted to open procedure: Secondary | ICD-10-CM | POA: Diagnosis not present

## 2017-08-08 HISTORY — PX: XI ROBOT ABDOMINAL PERINEAL RESECTION: SHX6709

## 2017-08-08 HISTORY — PX: ROBOTIC ASSISTED LAPAROSCOPIC BLADDER DIVERTICULECTOMY: SHX6079

## 2017-08-08 LAB — POCT I-STAT EG7
ACID-BASE DEFICIT: 8 mmol/L — AB (ref 0.0–2.0)
BICARBONATE: 18.8 mmol/L — AB (ref 20.0–28.0)
CALCIUM ION: 1.1 mmol/L — AB (ref 1.15–1.40)
HCT: 27 % — ABNORMAL LOW (ref 39.0–52.0)
Hemoglobin: 9.2 g/dL — ABNORMAL LOW (ref 13.0–17.0)
O2 SAT: 79 %
Potassium: 4.2 mmol/L (ref 3.5–5.1)
SODIUM: 141 mmol/L (ref 135–145)
TCO2: 20 mmol/L — AB (ref 22–32)
pCO2, Ven: 42.7 mmHg — ABNORMAL LOW (ref 44.0–60.0)
pH, Ven: 7.247 — ABNORMAL LOW (ref 7.250–7.430)
pO2, Ven: 48 mmHg — ABNORMAL HIGH (ref 32.0–45.0)

## 2017-08-08 LAB — POCT I-STAT 7, (LYTES, BLD GAS, ICA,H+H)
Acid-base deficit: 4 mmol/L — ABNORMAL HIGH (ref 0.0–2.0)
BICARBONATE: 22.5 mmol/L (ref 20.0–28.0)
Calcium, Ion: 1.09 mmol/L — ABNORMAL LOW (ref 1.15–1.40)
HCT: 33 % — ABNORMAL LOW (ref 39.0–52.0)
Hemoglobin: 11.2 g/dL — ABNORMAL LOW (ref 13.0–17.0)
O2 Saturation: 98 %
PCO2 ART: 43.9 mmHg (ref 32.0–48.0)
PO2 ART: 116 mmHg — AB (ref 83.0–108.0)
Potassium: 4.6 mmol/L (ref 3.5–5.1)
Sodium: 140 mmol/L (ref 135–145)
TCO2: 24 mmol/L (ref 22–32)
pH, Arterial: 7.313 — ABNORMAL LOW (ref 7.350–7.450)

## 2017-08-08 LAB — BASIC METABOLIC PANEL
Anion gap: 11 (ref 5–15)
BUN: 13 mg/dL (ref 6–20)
CALCIUM: 7.9 mg/dL — AB (ref 8.9–10.3)
CO2: 19 mmol/L — ABNORMAL LOW (ref 22–32)
Chloride: 109 mmol/L (ref 101–111)
Creatinine, Ser: 1.16 mg/dL (ref 0.61–1.24)
Glucose, Bld: 183 mg/dL — ABNORMAL HIGH (ref 65–99)
POTASSIUM: 4.2 mmol/L (ref 3.5–5.1)
SODIUM: 139 mmol/L (ref 135–145)

## 2017-08-08 LAB — TYPE AND SCREEN
ABO/RH(D): AB POS
ANTIBODY SCREEN: NEGATIVE

## 2017-08-08 LAB — POCT I-STAT 4, (NA,K, GLUC, HGB,HCT)
GLUCOSE: 146 mg/dL — AB (ref 65–99)
HEMATOCRIT: 39 % (ref 39.0–52.0)
HEMOGLOBIN: 13.3 g/dL (ref 13.0–17.0)
Potassium: 3.8 mmol/L (ref 3.5–5.1)
Sodium: 142 mmol/L (ref 135–145)

## 2017-08-08 LAB — HEMOGLOBIN AND HEMATOCRIT, BLOOD
HCT: 36.6 % — ABNORMAL LOW (ref 39.0–52.0)
HEMOGLOBIN: 12.5 g/dL — AB (ref 13.0–17.0)

## 2017-08-08 SURGERY — EXCISION, DIVERTICULUM, BLADDER, ROBOT-ASSISTED, LAPAROSCOPIC
Anesthesia: General | Site: Abdomen

## 2017-08-08 SURGERY — Surgical Case
Anesthesia: *Unknown

## 2017-08-08 MED ORDER — ROCURONIUM BROMIDE 50 MG/5ML IV SOSY
PREFILLED_SYRINGE | INTRAVENOUS | Status: AC
  Filled 2017-08-08: qty 5

## 2017-08-08 MED ORDER — ALBUMIN HUMAN 5 % IV SOLN
INTRAVENOUS | Status: AC
Start: 2017-08-08 — End: 2017-08-08
  Filled 2017-08-08: qty 250

## 2017-08-08 MED ORDER — FENTANYL CITRATE (PF) 100 MCG/2ML IJ SOLN
25.0000 ug | INTRAMUSCULAR | Status: DC | PRN
  Administered 2017-08-08 (×3): 50 ug via INTRAVENOUS
  Filled 2017-08-08: qty 2

## 2017-08-08 MED ORDER — EPHEDRINE SULFATE-NACL 50-0.9 MG/10ML-% IV SOSY
PREFILLED_SYRINGE | INTRAVENOUS | Status: DC | PRN
  Administered 2017-08-08 (×3): 10 mg via INTRAVENOUS

## 2017-08-08 MED ORDER — LIDOCAINE 2% (20 MG/ML) 5 ML SYRINGE
INTRAMUSCULAR | Status: DC | PRN
  Administered 2017-08-08: 100 mg via INTRAVENOUS

## 2017-08-08 MED ORDER — KETOROLAC TROMETHAMINE 15 MG/ML IJ SOLN
15.0000 mg | Freq: Four times a day (QID) | INTRAMUSCULAR | Status: AC
  Administered 2017-08-09 – 2017-08-10 (×6): 15 mg via INTRAVENOUS
  Filled 2017-08-08 (×6): qty 1

## 2017-08-08 MED ORDER — ONDANSETRON HCL 4 MG/2ML IJ SOLN: 4.0000 mg | INTRAMUSCULAR | Status: DC | PRN

## 2017-08-08 MED ORDER — METOCLOPRAMIDE HCL 5 MG/ML IJ SOLN: 10.0000 mg | Freq: Once | INTRAMUSCULAR | Status: DC | PRN

## 2017-08-08 MED ORDER — DIPHENHYDRAMINE HCL 12.5 MG/5ML PO ELIX: 12.5000 mg | ORAL_SOLUTION | Freq: Four times a day (QID) | ORAL | Status: DC | PRN

## 2017-08-08 MED ORDER — ONDANSETRON HCL 4 MG/2ML IJ SOLN
INTRAMUSCULAR | Status: DC | PRN
  Administered 2017-08-08: 4 mg via INTRAVENOUS

## 2017-08-08 MED ORDER — BUPIVACAINE-EPINEPHRINE (PF) 0.25% -1:200000 IJ SOLN
INTRAMUSCULAR | Status: AC
  Filled 2017-08-08: qty 30

## 2017-08-08 MED ORDER — SUFENTANIL CITRATE 50 MCG/ML IV SOLN
INTRAVENOUS | Status: AC
  Filled 2017-08-08: qty 1

## 2017-08-08 MED ORDER — PHENYLEPHRINE 40 MCG/ML (10ML) SYRINGE FOR IV PUSH (FOR BLOOD PRESSURE SUPPORT)
PREFILLED_SYRINGE | INTRAVENOUS | Status: AC
  Filled 2017-08-08: qty 10

## 2017-08-08 MED ORDER — ALBUMIN HUMAN 5 % IV SOLN
INTRAVENOUS | Status: AC
  Filled 2017-08-08: qty 250

## 2017-08-08 MED ORDER — LACTATED RINGERS IV SOLN
INTRAVENOUS | Status: DC
  Administered 2017-08-08 (×3): via INTRAVENOUS

## 2017-08-08 MED ORDER — SODIUM CHLORIDE 0.9 % IJ SOLN
INTRAMUSCULAR | Status: AC
  Filled 2017-08-08: qty 10

## 2017-08-08 MED ORDER — MIDAZOLAM HCL 2 MG/2ML IJ SOLN
INTRAMUSCULAR | Status: AC
  Filled 2017-08-08: qty 2

## 2017-08-08 MED ORDER — SUCCINYLCHOLINE CHLORIDE 200 MG/10ML IV SOSY
PREFILLED_SYRINGE | INTRAVENOUS | Status: AC
  Filled 2017-08-08: qty 10

## 2017-08-08 MED ORDER — ALBUMIN HUMAN 5 % IV SOLN
INTRAVENOUS | Status: DC | PRN
  Administered 2017-08-08 (×6): via INTRAVENOUS

## 2017-08-08 MED ORDER — HYDROMORPHONE 1 MG/ML IV SOLN
INTRAVENOUS | Status: DC
  Administered 2017-08-08: 1 mg via INTRAVENOUS
  Administered 2017-08-09: 3.4 mg via INTRAVENOUS
  Administered 2017-08-09: 2.6 mg via INTRAVENOUS
  Administered 2017-08-09: 3.7 mg via INTRAVENOUS
  Administered 2017-08-10: 04:00:00 via INTRAVENOUS
  Administered 2017-08-10: 7 mg via INTRAVENOUS
  Administered 2017-08-10: 6.2 mg via INTRAVENOUS
  Administered 2017-08-11: 2.8 mg via INTRAVENOUS
  Administered 2017-08-11: 2.4 mg via INTRAVENOUS
  Administered 2017-08-11: 1.4 mg via INTRAVENOUS
  Administered 2017-08-12: 0.6 mg via INTRAVENOUS
  Administered 2017-08-12: 1 mg via INTRAVENOUS
  Filled 2017-08-08 (×4): qty 25

## 2017-08-08 MED ORDER — SUGAMMADEX SODIUM 200 MG/2ML IV SOLN
INTRAVENOUS | Status: DC | PRN
  Administered 2017-08-08: 486.4 mg via INTRAVENOUS

## 2017-08-08 MED ORDER — PHENYLEPHRINE 40 MCG/ML (10ML) SYRINGE FOR IV PUSH (FOR BLOOD PRESSURE SUPPORT)
PREFILLED_SYRINGE | INTRAVENOUS | Status: DC | PRN
  Administered 2017-08-08 (×2): 80 ug via INTRAVENOUS

## 2017-08-08 MED ORDER — DIPHENHYDRAMINE HCL 50 MG/ML IJ SOLN: 12.5000 mg | Freq: Four times a day (QID) | INTRAMUSCULAR | Status: DC | PRN

## 2017-08-08 MED ORDER — ONDANSETRON HCL 4 MG/2ML IJ SOLN
INTRAMUSCULAR | Status: AC
  Filled 2017-08-08: qty 2

## 2017-08-08 MED ORDER — DEXTROSE 5 % IV SOLN
2.0000 g | INTRAVENOUS | Status: AC
  Administered 2017-08-08 (×4): 2 g via INTRAVENOUS
  Filled 2017-08-08 (×4): qty 2

## 2017-08-08 MED ORDER — ALBUMIN HUMAN 5 % IV SOLN
INTRAVENOUS | Status: AC
  Filled 2017-08-08: qty 500

## 2017-08-08 MED ORDER — ENOXAPARIN SODIUM 40 MG/0.4ML ~~LOC~~ SOLN
40.0000 mg | SUBCUTANEOUS | Status: DC
  Administered 2017-08-09 – 2017-08-14 (×6): 40 mg via SUBCUTANEOUS
  Filled 2017-08-08 (×6): qty 0.4

## 2017-08-08 MED ORDER — LACTATED RINGERS IV SOLN: INTRAVENOUS | Status: DC

## 2017-08-08 MED ORDER — SODIUM CHLORIDE 0.9% FLUSH: 9.0000 mL | INTRAVENOUS | Status: DC | PRN

## 2017-08-08 MED ORDER — ALVIMOPAN 12 MG PO CAPS
12.0000 mg | ORAL_CAPSULE | ORAL | Status: AC
  Administered 2017-08-08: 12 mg via ORAL
  Filled 2017-08-08: qty 1

## 2017-08-08 MED ORDER — DEXAMETHASONE SODIUM PHOSPHATE 10 MG/ML IJ SOLN
INTRAMUSCULAR | Status: AC
Start: 2017-08-08 — End: 2017-08-08
  Filled 2017-08-08: qty 1

## 2017-08-08 MED ORDER — LABETALOL HCL 5 MG/ML IV SOLN
INTRAVENOUS | Status: DC | PRN
  Administered 2017-08-08 (×2): 5 mg via INTRAVENOUS

## 2017-08-08 MED ORDER — LIDOCAINE 2% (20 MG/ML) 5 ML SYRINGE
INTRAMUSCULAR | Status: AC
  Filled 2017-08-08: qty 5

## 2017-08-08 MED ORDER — SODIUM CHLORIDE 0.9 % IR SOLN
Status: DC | PRN
Start: 2017-08-08 — End: 2017-08-08
  Administered 2017-08-08: 3000 mL

## 2017-08-08 MED ORDER — HYDROMORPHONE HCL 2 MG/ML IJ SOLN
INTRAMUSCULAR | Status: AC
  Filled 2017-08-08: qty 1

## 2017-08-08 MED ORDER — LABETALOL HCL 5 MG/ML IV SOLN
INTRAVENOUS | Status: AC
  Filled 2017-08-08: qty 4

## 2017-08-08 MED ORDER — DEXTROSE 5 % IV SOLN
1.0000 g | Freq: Three times a day (TID) | INTRAVENOUS | Status: AC
  Administered 2017-08-09 (×2): 1 g via INTRAVENOUS
  Filled 2017-08-08 (×3): qty 1

## 2017-08-08 MED ORDER — FENTANYL CITRATE (PF) 100 MCG/2ML IJ SOLN
INTRAMUSCULAR | Status: AC
  Filled 2017-08-08: qty 4

## 2017-08-08 MED ORDER — HYDROMORPHONE HCL 1 MG/ML IJ SOLN
INTRAMUSCULAR | Status: DC | PRN
  Administered 2017-08-08 (×3): 0.5 mg via INTRAVENOUS

## 2017-08-08 MED ORDER — SODIUM BICARBONATE 8.4 % IV SOLN
INTRAVENOUS | Status: AC
  Filled 2017-08-08: qty 50

## 2017-08-08 MED ORDER — SUFENTANIL CITRATE 50 MCG/ML IV SOLN
INTRAVENOUS | Status: DC | PRN
  Administered 2017-08-08 (×6): 10 ug via INTRAVENOUS
  Administered 2017-08-08: 20 ug via INTRAVENOUS
  Administered 2017-08-08: 10 ug via INTRAVENOUS
  Administered 2017-08-08: 20 ug via INTRAVENOUS
  Administered 2017-08-08 (×4): 10 ug via INTRAVENOUS

## 2017-08-08 MED ORDER — MIDAZOLAM HCL 2 MG/2ML IJ SOLN
INTRAMUSCULAR | Status: DC | PRN
  Administered 2017-08-08: 2 mg via INTRAVENOUS

## 2017-08-08 MED ORDER — NALOXONE HCL 0.4 MG/ML IJ SOLN: 0.4000 mg | INTRAMUSCULAR | Status: DC | PRN

## 2017-08-08 MED ORDER — SUFENTANIL CITRATE 50 MCG/ML IV SOLN
INTRAVENOUS | Status: AC
  Filled 2017-08-08: qty 2

## 2017-08-08 MED ORDER — PANTOPRAZOLE SODIUM 40 MG IV SOLR
40.0000 mg | INTRAVENOUS | Status: DC
  Administered 2017-08-08 – 2017-08-10 (×3): 40 mg via INTRAVENOUS
  Filled 2017-08-08 (×3): qty 40

## 2017-08-08 MED ORDER — PROPOFOL 10 MG/ML IV BOLUS
INTRAVENOUS | Status: DC | PRN
  Administered 2017-08-08: 200 mg via INTRAVENOUS

## 2017-08-08 MED ORDER — DEXAMETHASONE SODIUM PHOSPHATE 10 MG/ML IJ SOLN
INTRAMUSCULAR | Status: DC | PRN
  Administered 2017-08-08: 10 mg via INTRAVENOUS

## 2017-08-08 MED ORDER — LACTATED RINGERS IV SOLN
INTRAVENOUS | Status: DC
  Administered 2017-08-08: 10:00:00 via INTRAVENOUS

## 2017-08-08 MED ORDER — ALVIMOPAN 12 MG PO CAPS
12.0000 mg | ORAL_CAPSULE | Freq: Two times a day (BID) | ORAL | Status: DC
  Administered 2017-08-09 – 2017-08-13 (×10): 12 mg via ORAL
  Filled 2017-08-08 (×13): qty 1

## 2017-08-08 MED ORDER — LACTATED RINGERS IV SOLN
INTRAVENOUS | Status: DC
  Administered 2017-08-08 (×6): via INTRAVENOUS

## 2017-08-08 MED ORDER — SUGAMMADEX SODIUM 500 MG/5ML IV SOLN
INTRAVENOUS | Status: AC
  Filled 2017-08-08: qty 5

## 2017-08-08 MED ORDER — EPHEDRINE 5 MG/ML INJ
INTRAVENOUS | Status: AC
  Filled 2017-08-08: qty 10

## 2017-08-08 MED ORDER — KCL IN DEXTROSE-NACL 20-5-0.45 MEQ/L-%-% IV SOLN
INTRAVENOUS | Status: DC
  Administered 2017-08-08 – 2017-08-12 (×8): via INTRAVENOUS
  Filled 2017-08-08 (×12): qty 1000

## 2017-08-08 MED ORDER — ROCURONIUM BROMIDE 10 MG/ML (PF) SYRINGE
PREFILLED_SYRINGE | INTRAVENOUS | Status: DC | PRN
  Administered 2017-08-08 (×3): 20 mg via INTRAVENOUS
  Administered 2017-08-08: 30 mg via INTRAVENOUS
  Administered 2017-08-08 (×4): 20 mg via INTRAVENOUS
  Administered 2017-08-08 (×2): 10 mg via INTRAVENOUS
  Administered 2017-08-08: 50 mg via INTRAVENOUS
  Administered 2017-08-08: 30 mg via INTRAVENOUS

## 2017-08-08 MED ORDER — PROPOFOL 10 MG/ML IV BOLUS
INTRAVENOUS | Status: AC
  Filled 2017-08-08: qty 20

## 2017-08-08 MED ORDER — ARTIFICIAL TEARS OPHTHALMIC OINT
TOPICAL_OINTMENT | OPHTHALMIC | Status: AC
  Filled 2017-08-08: qty 3.5

## 2017-08-08 SURGICAL SUPPLY — 156 items
APPLICATOR SURGIFLO ENDO (HEMOSTASIS) IMPLANT
BAG LAPAROSCOPIC 12 15 PORT 16 (BASKET) IMPLANT
BAG RETRIEVAL 12/15 (BASKET)
BAG RETRIEVAL 12/15MM (BASKET)
BLADE EXTENDED COATED 6.5IN (ELECTRODE) ×3 IMPLANT
BLADE SURG SZ10 CARB STEEL (BLADE) IMPLANT
CANNULA REDUC XI 12-8 STAPL (CANNULA)
CANNULA REDUC XI 12-8MM STAPL (CANNULA)
CANNULA REDUCER 12-8 DVNC XI (CANNULA) IMPLANT
CATH FOLEY 2WAY SLVR  5CC 16FR (CATHETERS) ×2
CATH FOLEY 2WAY SLVR  5CC 18FR (CATHETERS) ×2
CATH FOLEY 2WAY SLVR 30CC 20FR (CATHETERS) IMPLANT
CATH FOLEY 2WAY SLVR 5CC 16FR (CATHETERS) ×1 IMPLANT
CATH FOLEY 2WAY SLVR 5CC 18FR (CATHETERS) ×1 IMPLANT
CATH ROBINSON RED A/P 16FR (CATHETERS) IMPLANT
CATH URET ROBINSON 24FR STRL (CATHETERS) ×3 IMPLANT
CELLS DAT CNTRL 66122 CELL SVR (MISCELLANEOUS) IMPLANT
CHLORAPREP W/TINT 26ML (MISCELLANEOUS) ×6 IMPLANT
CLIP VESOLOCK LG 6/CT PURPLE (CLIP) ×9 IMPLANT
CLIP VESOLOCK MED 6/CT (CLIP) IMPLANT
CLIP VESOLOCK MED LG 6/CT (CLIP) ×9 IMPLANT
CLIP VESOLOCK XL 6/CT (CLIP) IMPLANT
COVER BACK TABLE 60X90IN (DRAPES) ×3 IMPLANT
COVER SURGICAL LIGHT HANDLE (MISCELLANEOUS) ×3 IMPLANT
COVER TIP SHEARS 8 DVNC (MISCELLANEOUS) ×1 IMPLANT
COVER TIP SHEARS 8MM DA VINCI (MISCELLANEOUS) ×2
DECANTER SPIKE VIAL GLASS SM (MISCELLANEOUS) IMPLANT
DERMABOND ADVANCED (GAUZE/BANDAGES/DRESSINGS)
DERMABOND ADVANCED .7 DNX12 (GAUZE/BANDAGES/DRESSINGS) IMPLANT
DISSECTOR ROUND CHERRY 3/8 STR (MISCELLANEOUS) ×3 IMPLANT
DRAIN CHANNEL 19F RND (DRAIN) IMPLANT
DRAPE ARM DVNC X/XI (DISPOSABLE) ×4 IMPLANT
DRAPE COLUMN DVNC XI (DISPOSABLE) ×1 IMPLANT
DRAPE DA VINCI XI ARM (DISPOSABLE) ×8
DRAPE DA VINCI XI COLUMN (DISPOSABLE) ×2
DRAPE SURG IRRIG POUCH 19X23 (DRAPES) ×3 IMPLANT
DRSG OPSITE POSTOP 4X10 (GAUZE/BANDAGES/DRESSINGS) ×3 IMPLANT
DRSG OPSITE POSTOP 4X6 (GAUZE/BANDAGES/DRESSINGS) ×3 IMPLANT
DRSG OPSITE POSTOP 4X8 (GAUZE/BANDAGES/DRESSINGS) IMPLANT
ELECT CAUTERY BLADE 6.4 (BLADE) IMPLANT
ELECT PENCIL ROCKER SW 15FT (MISCELLANEOUS) ×3 IMPLANT
ELECT REM PT RETURN 15FT ADLT (MISCELLANEOUS) ×3 IMPLANT
ENDOLOOP SUT PDS II  0 18 (SUTURE)
ENDOLOOP SUT PDS II 0 18 (SUTURE) IMPLANT
EVACUATOR SILICONE 100CC (DRAIN) ×6 IMPLANT
GAUZE SPONGE 4X4 12PLY STRL (GAUZE/BANDAGES/DRESSINGS) ×3 IMPLANT
GLOVE BIO SURGEON STRL SZ 6.5 (GLOVE) ×4 IMPLANT
GLOVE BIO SURGEONS STRL SZ 6.5 (GLOVE) ×2
GLOVE BIOGEL M STRL SZ7.5 (GLOVE) ×9 IMPLANT
GLOVE BIOGEL PI IND STRL 7.0 (GLOVE) IMPLANT
GLOVE BIOGEL PI INDICATOR 7.0 (GLOVE)
GLOVE ECLIPSE 8.0 STRL XLNG CF (GLOVE) ×3 IMPLANT
GOWN STRL REUS W/TWL 2XL LVL3 (GOWN DISPOSABLE) IMPLANT
GOWN STRL REUS W/TWL LRG LVL3 (GOWN DISPOSABLE) ×15 IMPLANT
GOWN STRL REUS W/TWL XL LVL3 (GOWN DISPOSABLE) IMPLANT
GRASPER ENDOPATH ANVIL 10MM (MISCELLANEOUS) IMPLANT
GRASPER SUT TROCAR 14GX15 (MISCELLANEOUS) ×3 IMPLANT
HANDLE SUCTION POOLE (INSTRUMENTS) ×1 IMPLANT
HOLDER FOLEY CATH W/STRAP (MISCELLANEOUS) IMPLANT
IRRIG SUCT STRYKERFLOW 2 WTIP (MISCELLANEOUS) ×3
IRRIGATION SUCT STRKRFLW 2 WTP (MISCELLANEOUS) ×1 IMPLANT
KIT BASIN OR (CUSTOM PROCEDURE TRAY) IMPLANT
KIT PROCEDURE DA VINCI SI (MISCELLANEOUS)
KIT PROCEDURE DVNC SI (MISCELLANEOUS) IMPLANT
LIGASURE IMPACT 36 18CM CVD LR (INSTRUMENTS) ×3 IMPLANT
LOOP VESSEL MAXI BLUE (MISCELLANEOUS) ×3 IMPLANT
NEEDLE INSUFFLATION 14GA 120MM (NEEDLE) IMPLANT
PACK CARDIOVASCULAR III (CUSTOM PROCEDURE TRAY) IMPLANT
PACK COLON (CUSTOM PROCEDURE TRAY) IMPLANT
PACK GENERAL/GYN (CUSTOM PROCEDURE TRAY) IMPLANT
PACK ROBOT UROLOGY CUSTOM (CUSTOM PROCEDURE TRAY) ×3 IMPLANT
PORT LAP GEL ALEXIS MED 5-9CM (MISCELLANEOUS) IMPLANT
POUCH OSTOMY FLEX CONVEX 2-1/8 (OSTOMY) ×3 IMPLANT
RELOAD LINEAR CUT PROX 55 BLUE (ENDOMECHANICALS) IMPLANT
RELOAD PROXIMATE 75MM BLUE (ENDOMECHANICALS) ×6 IMPLANT
RELOAD STAPLER GOLD 60MM (STAPLE) IMPLANT
RELOAD STAPLER WHITE 60MM (STAPLE) IMPLANT
RETRACTOR LONE STAR DISPOSABLE (INSTRUMENTS) IMPLANT
RETRACTOR STAY HOOK 5MM (MISCELLANEOUS) IMPLANT
RTRCTR WOUND ALEXIS 18CM MED (MISCELLANEOUS)
SCISSORS LAP 5X35 DISP (ENDOMECHANICALS) IMPLANT
SEAL CANN UNIV 5-8 DVNC XI (MISCELLANEOUS) ×4 IMPLANT
SEAL XI 5MM-8MM UNIVERSAL (MISCELLANEOUS) ×8
SEALER TISSUE X1 CVD JAW (INSTRUMENTS) IMPLANT
SEALER VESSEL DA VINCI XI (MISCELLANEOUS)
SEALER VESSEL EXT DVNC XI (MISCELLANEOUS) IMPLANT
SLEEVE ADV FIXATION 5X100MM (TROCAR) IMPLANT
SOLUTION ELECTROLUBE (MISCELLANEOUS) ×3 IMPLANT
SPONGE LAP 18X18 X RAY DECT (DISPOSABLE) ×30 IMPLANT
SPONGE LAP 4X18 X RAY DECT (DISPOSABLE) IMPLANT
STAPLER 45 BLU RELOAD XI (STAPLE) IMPLANT
STAPLER 45 BLUE RELOAD XI (STAPLE)
STAPLER 45 GREEN RELOAD XI (STAPLE)
STAPLER 45 GRN RELOAD XI (STAPLE) IMPLANT
STAPLER CANNULA SEAL DVNC XI (STAPLE) IMPLANT
STAPLER CANNULA SEAL XI (STAPLE)
STAPLER ECHELON LONG 60 440 (INSTRUMENTS) IMPLANT
STAPLER GUN LINEAR PROX 60 (STAPLE) ×3 IMPLANT
STAPLER PROXIMATE 55 BLUE (STAPLE) IMPLANT
STAPLER PROXIMATE 75MM BLUE (STAPLE) ×3 IMPLANT
STAPLER RELOAD GOLD 60MM (STAPLE)
STAPLER RELOAD WHITE 60MM (STAPLE)
STAPLER SHEATH (SHEATH)
STAPLER SHEATH ENDOWRIST DVNC (SHEATH) IMPLANT
STAPLER VISISTAT 35W (STAPLE) IMPLANT
STENT SET URETHERAL LEFT 7FR (STENTS) ×3 IMPLANT
STENT SET URETHERAL RIGHT 7FR (STENTS) ×3 IMPLANT
SUCTION FRAZIER HANDLE 12FR (TUBING) ×2
SUCTION POOLE HANDLE (INSTRUMENTS) ×3
SUCTION TUBE FRAZIER 12FR DISP (TUBING) ×1 IMPLANT
SURGIFLO W/THROMBIN 8M KIT (HEMOSTASIS) IMPLANT
SUT CHROMIC 3 0 SH 27 (SUTURE) IMPLANT
SUT CHROMIC 4 0 SH 27 (SUTURE) ×3 IMPLANT
SUT ETHILON 2 0 PS N (SUTURE) ×9 IMPLANT
SUT ETHILON 3 0 PS 1 (SUTURE) IMPLANT
SUT MNCRL AB 4-0 PS2 18 (SUTURE) IMPLANT
SUT NOVA NAB GS-21 0 18 T12 DT (SUTURE) IMPLANT
SUT PDS AB 1 CTX 36 (SUTURE) IMPLANT
SUT PDS AB 1 TP1 96 (SUTURE) ×6 IMPLANT
SUT PROLENE 2 0 KS (SUTURE) IMPLANT
SUT SILK 0 (SUTURE) ×2
SUT SILK 0 30XBRD TIE 6 (SUTURE) ×1 IMPLANT
SUT SILK 2 0 (SUTURE) ×2
SUT SILK 2 0 SH CR/8 (SUTURE) IMPLANT
SUT SILK 2-0 18XBRD TIE 12 (SUTURE) ×1 IMPLANT
SUT SILK 3 0 (SUTURE)
SUT SILK 3 0 SH CR/8 (SUTURE) ×3 IMPLANT
SUT SILK 3-0 18XBRD TIE 12 (SUTURE) IMPLANT
SUT V-LOC BARB 180 2/0GR6 GS22 (SUTURE)
SUT VIC AB 0 CT1 36 (SUTURE) ×6 IMPLANT
SUT VIC AB 2-0 SH 18 (SUTURE) ×3 IMPLANT
SUT VIC AB 2-0 SH 27 (SUTURE) ×22
SUT VIC AB 2-0 SH 27X BRD (SUTURE) ×11 IMPLANT
SUT VIC AB 3-0 SH 18 (SUTURE) IMPLANT
SUT VIC AB 3-0 SH 27 (SUTURE) ×2
SUT VIC AB 3-0 SH 27X BRD (SUTURE) ×1 IMPLANT
SUT VIC AB 4-0 PS2 27 (SUTURE) ×3 IMPLANT
SUT VIC AB 4-0 RB1 18 (SUTURE) ×9 IMPLANT
SUT VIC AB 4-0 RB1 27 (SUTURE)
SUT VIC AB 4-0 RB1 27XBRD (SUTURE) IMPLANT
SUT VIC AB 4-0 SH 18 (SUTURE) IMPLANT
SUTURE V-LC BRB 180 2/0GR6GS22 (SUTURE) IMPLANT
SYR 10ML ECCENTRIC (SYRINGE) IMPLANT
SYS LAPSCP GELPORT 120MM (MISCELLANEOUS)
SYSTEM LAPSCP GELPORT 120MM (MISCELLANEOUS) IMPLANT
SYSTEM UROSTOMY GENTLE TOUCH (WOUND CARE) ×3 IMPLANT
TAPE PAPER 3X10 WHT MICROPORE (GAUZE/BANDAGES/DRESSINGS) ×3 IMPLANT
TOWEL OR 17X26 10 PK STRL BLUE (TOWEL DISPOSABLE) IMPLANT
TOWEL OR NON WOVEN STRL DISP B (DISPOSABLE) ×6 IMPLANT
TRAY FOLEY W/METER SILVER 16FR (SET/KITS/TRAYS/PACK) IMPLANT
TROCAR ADV FIXATION 5X100MM (TROCAR) IMPLANT
TUBING CONNECTING 10 (TUBING) IMPLANT
TUBING CONNECTING 10' (TUBING)
TUBING INSUFFLATION 10FT LAP (TUBING) IMPLANT
WATER STERILE IRR 1000ML POUR (IV SOLUTION) IMPLANT
YANKAUER SUCT BULB TIP 10FT TU (MISCELLANEOUS) ×3 IMPLANT

## 2017-08-08 NOTE — Anesthesia Procedure Notes (Signed)
Procedure Name: Intubation Date/Time: 08/08/2017 10:44 AM Performed by: Danley Danker L Patient Re-evaluated:Patient Re-evaluated prior to induction Oxygen Delivery Method: Circle system utilized Preoxygenation: Pre-oxygenation with 100% oxygen Induction Type: IV induction Ventilation: Mask ventilation without difficulty and Oral airway inserted - appropriate to patient size Laryngoscope Size: Miller and 3 Grade View: Grade II Tube type: Subglottic suction tube Tube size: 8.0 mm Number of attempts: 1 Airway Equipment and Method: Stylet Placement Confirmation: ETT inserted through vocal cords under direct vision,  positive ETCO2 and breath sounds checked- equal and bilateral Secured at: 22 cm Tube secured with: Tape Dental Injury: Teeth and Oropharynx as per pre-operative assessment

## 2017-08-08 NOTE — Anesthesia Preprocedure Evaluation (Signed)
Anesthesia Evaluation  Patient identified by MRN, date of birth, ID band Patient awake    Reviewed: Allergy & Precautions, NPO status , Patient's Chart, lab work & pertinent test results  Airway Mallampati: II  TM Distance: >3 FB Neck ROM: Full    Dental no notable dental hx.    Pulmonary neg pulmonary ROS, former smoker,    Pulmonary exam normal breath sounds clear to auscultation       Cardiovascular negative cardio ROS Normal cardiovascular exam Rhythm:Regular Rate:Normal  RBBB   Neuro/Psych negative neurological ROS  negative psych ROS   GI/Hepatic negative GI ROS, Neg liver ROS,   Endo/Other  negative endocrine ROS  Renal/GU negative Renal ROS  negative genitourinary   Musculoskeletal negative musculoskeletal ROS (+)   Abdominal   Peds negative pediatric ROS (+)  Hematology negative hematology ROS (+)   Anesthesia Other Findings   Reproductive/Obstetrics negative OB ROS                             Anesthesia Physical Anesthesia Plan  ASA: II  Anesthesia Plan: General   Post-op Pain Management:    Induction: Intravenous  PONV Risk Score and Plan: 3 and Ondansetron, Dexamethasone, Midazolam and Treatment may vary due to age or medical condition  Airway Management Planned: Oral ETT  Additional Equipment:   Intra-op Plan:   Post-operative Plan: Extubation in OR  Informed Consent: I have reviewed the patients History and Physical, chart, labs and discussed the procedure including the risks, benefits and alternatives for the proposed anesthesia with the patient or authorized representative who has indicated his/her understanding and acceptance.   Dental advisory given  Plan Discussed with: CRNA  Anesthesia Plan Comments:         Anesthesia Quick Evaluation

## 2017-08-08 NOTE — Anesthesia Postprocedure Evaluation (Signed)
Anesthesia Post Note  Patient: Kristopher Thompson, DDS  Procedure(s) Performed: Procedure(s) (LRB): XI ROBOTIC ASSISTED LAPAROSCOPIC RADICAL CYSTECTOMY COVERTED TO OPEN PELVIC LYMPHADNECTOMY BILATERAL AND ILEAL CONDUIT URINARY DIVERSION (N/A) REPAIR OF RECTAL TEAR POSSIBLE PARTIAL PROCTECTOMY, CREATION OF  OSTOMY (N/A)     Patient location during evaluation: PACU Anesthesia Type: General Level of consciousness: awake and alert Pain management: pain level controlled Vital Signs Assessment: post-procedure vital signs reviewed and stable Respiratory status: spontaneous breathing, nonlabored ventilation, respiratory function stable and patient connected to nasal cannula oxygen Cardiovascular status: blood pressure returned to baseline and stable Postop Assessment: no apparent nausea or vomiting Anesthetic complications: no    Last Vitals:  Vitals:   08/08/17 1930 08/08/17 1945  BP: (!) 157/85 (!) 163/88  Pulse: 98 99  Resp: 19 13  Temp:    SpO2: 100% 100%                  Effie Berkshire

## 2017-08-08 NOTE — Anesthesia Procedure Notes (Signed)
Arterial Line Insertion Start/End9/27/2018 5:24 PM, 08/08/2017 5:26 PM Performed by: Suella Broad D, anesthesiologist  Patient location: Pre-op. Preanesthetic checklist: patient identified, IV checked, site marked, risks and benefits discussed, surgical consent, monitors and equipment checked, pre-op evaluation, timeout performed and anesthesia consent Lidocaine 1% used for infiltration Left, radial was placed Catheter size: 20 Fr Hand hygiene performed  and maximum sterile barriers used   Attempts: 1 Procedure performed without using ultrasound guided technique. Following insertion, dressing applied. Post procedure assessment: normal and unchanged

## 2017-08-08 NOTE — Op Note (Signed)
Preoperative diagnosis: Muscle invasive bladder cancer  Postoperative diagnosis: Muscle invasive bladder cancer  Procedures: 1.  Radical cystectomy 2.  Bilateral extended pelvic lymphadenectomy 3.  Ileal conduit urinary diversion 4.  Adhesiolysis  Surgeon: Pryor Curia. M.D.  Assistant: Debbrah Alar, PA-C  Resident: Dr. Jess Barters  Anesthesia: General  Estimated blood loss: 1300 cc  Intravenous fluids: 6000 cc of crystalloid and 1000 cc of colloid  Specimens: 1.  Bladder 2.  Right external iliac lymph nodes 3.  Right common iliac lymph nodes 4.  Right obturator lymph nodes 5.  Presacral lymph nodes 6.  Left external iliac lymph nodes 7.  Left common iliac lymph nodes 8.  Left obturator lymph nodes 9.  Right ureteral margin 10.  Left ureteral margin  Disposition of specimens to pathology  Intraoperative findings: Right and left ureteral margins were noted to be negative on frozen section analysis.  Indication: Dr. Gabriel is a 66 year old gentleman with a history of prostate cancer status post a robotic prostatectomy in the past.  He was recently noted to have gross hematuria and eventually was found to have a bladder mass concerning for bladder malignancy.  This was proven to be a high grade and poorly differentiated malignancy thought to possibly represent a primary adenocarcinoma.  His metastatic evaluation was negative.  He did undergo an evaluation by colorectal surgery that did not demonstrate any obvious invasion of the rectum or suggest that this tumor was primarily from the rectum.  Based on his prior surgical history that included multiple open abdominal incisions as well as his prior prostatectomy, it was felt that his surgery could be quite challenging.  However, it was felt that an attempted robot-assisted laparoscopic approach was worthwhile with a very high risk for open surgical conversion.  In addition, the patient was marked preoperatively for a  possible colostomy considering the high risk for necessitating rectal resection during this procedure considering his tumor location and prior surgery in the vicinity of the bladder and rectum.  After a detailed discussion, elected to proceed with the above procedures.  The potential risks, complications, and expected recovery process were discussed in detail and informed consent was obtained.  Description of procedure:  The patient was taken to the operating room and a general anesthetic was administered.  He was given preoperative antibiotics, placed in the dorsal lithotomy position, and draped in the usual sterile fashion.  Next, a preoperative timeout was performed.  The patient's abdomen was examined.  He did have a large upper midline incision as well as a lower right quadrant incision as well as prior laparoscopic incisions from his prostatectomy.  An attempt was made to make an incision just superior to the umbilicus and this was carried down through his abdominal wall which was quite thick measuring approximately 4 inches.  The fascia was divided and the patient appeared to have significant adhesions that did not allow placement of a camera.  I then attempted to make an incision in the vicinity of his left lower quadrant ostomy mark.  This was also carried down through the subcutaneous tissues.  The fascia was divided and although there was noted to be some space, this was limited.  However, an 8 mm robotic port was placed and a camera was used to inspect the abdomen.  There appeared to be extensive adhesions throughout the abdominal cavity and pelvis.  It was felt that proceeding in a minimally invasive fashion would not be feasible.  The decision was therefore made  to convert to an open procedure at this point.  The patient's arms were untucked and repositioned.  A lower midline incision was then made and carried up around the left side of the umbilicus and extended to the initial supraumbilical  incision that had been made previously.  This was carried down through the subcutaneous tissues with electrocautery.  Rectus fascia was then identified and divided in the midline.  The peritoneum was then identified and sharply divided allowing entry into the peritoneal cavity.  The fascia was then opened along the length of the incision.  There were numerous adhesions within the abdominal cavity.  Adhesions between the large and small bowel and the abdominal wall as well as between the bowel itself were carefully taken down with sharp dissection with Metzenbaum scissors.  This was quite tedious and took approximately 75 minutes.  Once all adhesions were taken down, the abdomen was inspected and the bowel was packed superiorly.  A self-retaining Bookwalter retractor was placed.  The sigmoid colon was then reflected to the left.  The right ureter was identified and the indwelling right ureteral stent was palpated.  The ureter was dissected free with a combination of blunt and sharp dissection as necessary with care to control small blood vessels but with care also to maintain the blood supply to the ureter.  Once the ureter was carried down into the pelvis near the bladder, it was clamped and divided.  A margin was obtained which later returned negative for malignancy.  The ureteral stump was tied off with a 0 silk suture.  The sigmoid colon was then retracted to the right side and an identical procedure was performed allowing identification and mobilization of the left ureter down to the bladder which was also similarly divided and ligated.  The left ureteral margin was also negative for malignancy.  The bladder was then examined.  Care was taken to try to develop the space of Retzius which was quite adhesed from his prior radical prostatectomy.  The posterior peritoneum was incised and the space between the bladder and the rectum and I was able to create an initial space to isolate the vascular pedicles of the  bladder.  However, there was noted to be significant adhesion between the rectum and the bladder more distally toward the prior prostatectomy field.  The LigaSure was used to ligate the vascular pedicles of the bladder safely.  I then asked Dr. Leighton Ruff, who had evaluated the patient preoperatively and was scheduled to participate in the procedure, to come and examine the pelvis.  Together, we continued to dissect out the bladder and mobilize it from the rectum.  The tumor was able to be identified and didn't feel free from the rectum.  However, it did extend toward the bladder neck which was significantly scarred in near the rectum and urethra.  Dissection continued in the urethra was eventually entered.  The catheter was brought into the operative field and used to retract the bladder superiorly.  This left the posterior aspect of the bladder neck and urethra still intact.  During this dissection, the tissue was noted to be quite thickened and adhesed.  This tissue was divided and it was noted that there was an opening in the anterior rectum.  The pelvis was irrigated.  The bladder specimen was removed and sent for permanent pathologic analysis.  At this point, I turned over the case to Dr. Marcello Moores who performed a primary rectal repair.  This will be dictated separately.  Due to the poor rectal tissue, it was felt that the patient would require a colostomy diversion following primary repair of the rectum.  Once the rectum was repaired, attention turned to the pelvic lymphadenectomy.  An extended pelvic lymph node dissection was performed from the level of the aortic bifurcation superiorly to Cooper's ligament inferiorly to the genitofemoral nerve laterally into the pelvis medially including the presacral node packet.  Using a split and roll technique, the lymphatic tissue overlying the external iliac artery on the right side was divided any significant lymphatics were ligated with Weck clips.  Care was  taken to avoid vascular or obturator nerve injury.  The lymphatic packets were separated into the common iliac lymph nodes, external iliac lymph nodes, and obturator lymph node packets.  A presacral lymph node packet was also obtained.  An identical procedure was then performed on the left side and all lymphatic packets were sent for permanent pathologic analysis and labeled similarly.  Dr. Marcello Moores then again scrubbed in and examined the patient to evaluate him for colostomy.  The patient did have extremely thickened mesentery.  She did bring up a loop colostomy in the left lower quadrant.  This will be dictated by her separately.  The colostomy was not yet matured.  Attention then turned to creation of the ileal conduit.  The terminal ileum was identified and marked.  A segment of approximately 15 cm of ileum was marked out at least 15 cm from the cecum.  The mesentery was transilluminated although this was quite difficult due to the extremely thickened mesentery.  A distal incision in the mesentery was made and carried up to the edge of the bowel which was carefully cleared off.  A proximal incision and the mesentery was also made with care to preserve adequate blood supply to the ileal conduit.  The edge of the bowel here was also carefully cleared off.  The 75 mm Endo GIA was then fired over these 2 portions of the small intestine around the ileal conduit to be laid inferiorly.  The native ends of the ileum were then brought together a small opening was made in each side of the antimesenteric portion of the proximal and distal ends of the ileum.  The 75 mm Endo GIA was again inserted into the bowel in each and then the bowel was aligned in an antimesenteric fashion.  The stapler was then fired resulting in reanastomosis of the ileum.  A 3-0 silk suture was placed in the crotch of the staple line for additional support and to take tension off the staple line.  The TA 60 stapler was then used to close the  bowel anastomosis.  The bowel anastomosis was checked and felt quite patent.  The mesentery was then closed with a running 3-0 silk suture.  The ileal conduit was then examined.  At the butt end of the conduit an opening was made in the mucosa was everted with 4-0 Vicryl sutures.  The right ureter was then secured to the conduit with a 4-0 Vicryl suture and opened and spatulated.  Interrupted 4-0 Vicryl sutures were then placed at the spatulated end of the ureteral opening and sewn to the corresponding part of the enterotomy in the ileal conduit.  Additional interrupted sutures were placed on either side of the original suture.  A blue Bander stent was then advanced down the ureter and the wire was removed.  A pediatric Fraser tip sucker was then passed through the end of the ileal  conduit back toward the enterotomy and the stent was brought through the sucker tip and out the end of the conduit.  The remaining portion of the ureteroenteric anastomosis was then performed with 2 running 4-0 Vicryl sutures on either side.  An identical procedure was then performed with the left ureter.  A red Bander stent was placed on the left side.  Attention then turned to creation of the ileal conduit stoma.  The previously marked urostomy site was opened at skin level and a circular fashion.  A circular plug of subcutaneous fat was removed with electrocautery.  The fascia was identified and opened in a cruciate manner.  2 fingerbreadths were able to be passed through the fascial opening.  The end of the ileal conduit was then grasped with a Babcock clamp and brought to the skin.  Of note, the patient's very thick abdominal wall made this quite challenging and difficult. However, this was eventually able to be performed.  At this point the previously made incision at the original colostomy marking was closed at the fascial level with interrupted 0 Vicryl sutures.  It was decided that the colostomy would require a lower ostomy  site earlier in the procedure per Dr. Marcello Moores.  2 drains were then placed in the right lower quadrant and brought through the abdominal wall.  A 19 [place] Blake drain was placed in the pelvis and a more superior located 19 [place] Blake drain was brought around the ureteral anastomoses.  The abdomen and pelvis were examined and hemostasis appeared excellent.  Attention turned to closure of the midline wound.  A running #1 PDS suture was used to close the fascia.  The superficial abdominal wound was then copiously irrigated.  The skin was reapproximated with staples.  Attention then turned to maturation of the stomas.  The colostomy stoma was matured per Dr. Marcello Moores and will be dictated separately.  The urostomy was matured with 3-0 Vicryl sutures placed in an interrupted fashion between the skin, bowel serosa, and mucosal surface.  An attempt was made to rosebud the urostomy although this was quite difficult based on the patient's abdominal size.  Additional interrupted 3-0 Vicryl sutures were placed between the skin and the mucosa.  Ureteral stents were sewn in place with 3-0 chromic sutures to the bowel.  A urostomy appliance was applied.  The wounds were then covered with dressings.  The patient appeared to tolerate the procedure well and without hemodynamic instability.  He was able to be extubated and transferred to the recovery unit in satisfactory condition.

## 2017-08-08 NOTE — Transfer of Care (Signed)
Immediate Anesthesia Transfer of Care Note  Patient: Kristopher Thompson, DDS  Procedure(s) Performed: Procedure(s): XI ROBOTIC ASSISTED LAPAROSCOPIC RADICAL CYSTECTOMY COVERTED TO OPEN PELVIC LYMPHADNECTOMY BILATERAL AND ILEAL CONDUIT URINARY DIVERSION (N/A) REPAIR OF RECTAL TEAR POSSIBLE PARTIAL PROCTECTOMY, CREATION OF  OSTOMY (N/A)  Patient Location: PACU  Anesthesia Type:General  Level of Consciousness:  sedated, patient cooperative and responds to stimulation  Airway & Oxygen Therapy:Patient Spontanous Breathing and Patient connected to face mask oxgen  Post-op Assessment:  Report given to PACU RN and Post -op Vital signs reviewed and stable  Post vital signs:  Reviewed and stable  Last Vitals:  Vitals:   08/08/17 0806  BP: (!) 142/79  Pulse: 71  Resp: 18  Temp: 36.6 C  SpO2: 18%    Complications: No apparent anesthesia complications

## 2017-08-09 ENCOUNTER — Encounter (HOSPITAL_COMMUNITY): Payer: Self-pay | Admitting: Urology

## 2017-08-09 LAB — CBC
HCT: 31.2 % — ABNORMAL LOW (ref 39.0–52.0)
Hemoglobin: 10.9 g/dL — ABNORMAL LOW (ref 13.0–17.0)
MCH: 29.5 pg (ref 26.0–34.0)
MCHC: 34.9 g/dL (ref 30.0–36.0)
MCV: 84.6 fL (ref 78.0–100.0)
PLATELETS: 150 10*3/uL (ref 150–400)
RBC: 3.69 MIL/uL — ABNORMAL LOW (ref 4.22–5.81)
RDW: 13.4 % (ref 11.5–15.5)
WBC: 12.2 10*3/uL — ABNORMAL HIGH (ref 4.0–10.5)

## 2017-08-09 LAB — BASIC METABOLIC PANEL
Anion gap: 5 (ref 5–15)
BUN: 14 mg/dL (ref 6–20)
CALCIUM: 7.6 mg/dL — AB (ref 8.9–10.3)
CO2: 25 mmol/L (ref 22–32)
Chloride: 108 mmol/L (ref 101–111)
Creatinine, Ser: 0.81 mg/dL (ref 0.61–1.24)
GFR calc Af Amer: >60 mL/min (ref >60)
GLUCOSE: 129 mg/dL — AB (ref 65–99)
Potassium: 3.8 mmol/L (ref 3.5–5.1)
Sodium: 138 mmol/L (ref 135–145)

## 2017-08-09 MED ORDER — INFLUENZA VAC SPLIT HIGH-DOSE 0.5 ML IM SUSY
0.5000 mL | PREFILLED_SYRINGE | INTRAMUSCULAR | Status: AC
  Administered 2017-08-10: 0.5 mL via INTRAMUSCULAR
  Filled 2017-08-09: qty 0.5

## 2017-08-09 MED ORDER — PNEUMOCOCCAL VAC POLYVALENT 25 MCG/0.5ML IJ INJ
0.5000 mL | INJECTION | INTRAMUSCULAR | Status: AC
  Administered 2017-08-10: 0.5 mL via INTRAMUSCULAR
  Filled 2017-08-09: qty 0.5

## 2017-08-09 NOTE — Progress Notes (Signed)
1 Day Post-Op distal rectal laceration repair with diverting sigmoid loop colostomy Subjective: Patient states pain is controlled.  No nausea.  Objective: Vital signs in last 24 hours: Temp:  [97.6 F (36.4 C)-98.8 F (37.1 C)] 98.4 F (36.9 C) (09/28 0800) Pulse Rate:  [96-122] 109 (09/28 1000) Resp:  [12-24] 24 (09/28 1000) BP: (148-163)/(85-90) 148/87 (09/27 2015) SpO2:  [95 %-100 %] 100 % (09/28 1000) Weight:  [122 kg (268 lb 15.4 oz)] 122 kg (268 lb 15.4 oz) (09/28 0401)   Intake/Output from previous day: 09/27 0701 - 09/28 0700 In: 7250 [I.V.:6000; IV Piggyback:1250] Out: 3735 [Urine:1750; Drains:580; Stool:75; Blood:1330] Intake/Output this shift: Total I/O In: 2902.1 [I.V.:2902.1] Out: 425 [Urine:350; Drains:75]   General appearance: alert and cooperative GI: normal findings: soft, mildly distended Colostomy: beef red, slightly retracted Incision: clear blood tinged fluid draining from incision  Lab Results:   Recent Labs  08/08/17 1925 08/09/17 0843  WBC  --  12.2*  HGB 12.5* 10.9*  HCT 36.6* 31.2*  PLT  --  150   BMET  Recent Labs  08/08/17 1925 08/09/17 0843  NA 139 138  K 4.2 3.8  CL 109 108  CO2 19* 25  GLUCOSE 183* 129*  BUN 13 14  CREATININE 1.16 0.81  CALCIUM 7.9* 7.6*   PT/INR No results for input(s): LABPROT, INR in the last 72 hours. ABG  Recent Labs  08/08/17 1708 08/08/17 1734  PHART  --  7.313*  HCO3 18.8* 22.5    MEDS, Scheduled . alvimopan  12 mg Oral BID  . enoxaparin (LOVENOX) injection  40 mg Subcutaneous Q24H  . HYDROmorphone   Intravenous Q4H  . ketorolac  15 mg Intravenous Q6H  . pantoprazole (PROTONIX) IV  40 mg Intravenous Q24H    Studies/Results: Dg Abd Portable 1v  Result Date: 08/08/2017 CLINICAL DATA:  Check ureteral stent placement. EXAM: PORTABLE ABDOMEN - 1 VIEW COMPARISON:  None. FINDINGS: Supine portable view the abdomen was obtained. Multiple catheters are superimposed upon the abdomen. There is  no surgical note yet available with which to correlate the catheters seen on this film. Patient has apparently undergone cystectomy with ileal diversion. Two apparent ureteral stents are identified with the proximal loop of each stent formed over the expected region of each respective renal pelvis. The stents then pass off the right edge of the film, presumably exiting through the ileal conduit. In the expected region of the stoma there is a drain or catheter in a circular configuration. There may be another surgical drain coiled in the inferior pelvis. A third circular catheter appearing structure overlies the left pelvis and is indeterminate. Skin staples overlie the right paramidline abdomen/pelvis and left paramidline upper pelvis. IMPRESSION: Multiple tubes/ catheters project over the abdomen without an operative note yet available with which to correlate. Two apparent ureteral stents are identified and each appears to have the proximal loop formed in the expected location of the respective renal pelvis. The distal ends of these catheters pass off the right side of the film. Electronically Signed   By: Misty Stanley M.D.   On: 08/08/2017 19:43    Assessment: s/p Procedure(s): XI ROBOTIC ASSISTED LAPAROSCOPIC RADICAL CYSTECTOMY COVERTED TO OPEN PELVIC LYMPHADNECTOMY BILATERAL AND ILEAL CONDUIT URINARY DIVERSION REPAIR OF RECTAL TEAR POSSIBLE PARTIAL PROCTECTOMY, CREATION OF  OSTOMY Patient Active Problem List   Diagnosis Date Noted  . 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 Bladder cancer ([place]) 08/08/2017    Expected post op course  Plan: would  cont npo due to anticipated ileus  Ostomy RN to eval stomas and start teaching Monitor JP output Anticipate colostomy reversal once rectum has healed   LOS: 1 day     .Rosario Adie, [facility], [place]   08/09/2017 11:04 AM

## 2017-08-09 NOTE — Op Note (Signed)
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  Patient Care Team: Lorene Dy, MD as PCP - General (Internal Medicine)  PRE-OPERATIVE DIAGNOSIS:  bladder cancer,   POST-OPERATIVE DIAGNOSIS:  bladder cancer,   PROCEDURE:  Procedure(s): XI ROBOTIC ASSISTED LAPAROSCOPIC RADICAL CYSTECTOMY COVERTED TO OPEN PELVIC LYMPHADNECTOMY BILATERAL AND ILEAL CONDUIT URINARY DIVERSION REPAIR OF RECTAL TEAR, CREATION OF DIVERTING LOOP COLOSTOMY   Surgeon(s): Raynelle Bring, MD Ileana Roup, MD Leighton Ruff, MD  ASSISTANT: Dr Dema Severin   ANESTHESIA:   general  EBL:  Total I/O In: 2902.1 [I.V.:2902.1] Out: 425 [Urine:350; Drains:75]  DRAINS: (56F) Jackson-Pratt drain(s) with closed bulb suction in the pelvis   COUNTS:  YES  PLAN OF CARE: Admit to inpatient   PATIENT DISPOSITION:  PACU - hemodynamically stable.  INDICATION: 65 year old male who I was asked to evaluate for possible rectal resection due to a posterior bladder neck tumor.  The patient also had a history of a robotic prostatectomy and 2 other open operations, and significant adhesions were expected.   OR FINDINGS: significant intra-abdominal adhesions preventing robotic repair and conversion to open operation.  Linear rectal laceration noted after resection of bladder mass closely involved to this area.  DESCRIPTION: the patient was identified in the preoperative holding area and taken to the OR where they were laid supine on the operating room table.  General anesthesia was induced without difficulty. SCDs were also noted to be in place prior to the initiation of anesthesia.  The patient was then prepped and draped in the usual sterile fashion.   A surgical timeout was performed indicating the correct patient, procedure, positioning and need for preoperative antibiotics.   Dr. [name] began the procedure with anticipation of a robotic approach.  He was unable to place ports into the abdomen due  to significant adhesions and therefore the operation was converted to open.  Dr. [name] and h continued with dissection of adhesions and I was called into the operating room later once ureters had been identified and the bladder had been mostly resected.  Upon evaluation the bladder mass was densely adherent to the anterior rectal wall.  There was no sign of direct invasion into the rectum but the patient had significant scar tissue from his previous prostatectomy.  We decided to proceed with dissection of the posterior bladder wall off of the rectum using blunt mobilization.  The rectum was entered during this resection.  Once the bladder was removed, I evaluated a 3 cm anterior rectal laceration.  This was involving less than 50% of the circumference of the rectum.  There was and no sign of further tissue damage.  I decided to attempt to close the vertical incision horizontally and avoid rectal resection.  I was able to get most of this closed with interrupted 2-0 Vicryl sutures.  There was additional rectal tearing distal to this that I could not reapproximate horizontally.  This was closed with 2 interrupted Vicryl sutures to close the incision vertically.  We passed a 29 mm EEA dilator into the rectum and this easily passed through the area indicating no sign of stenosis.  I decided to perform a diverting colostomy since the patient was undergoing an ileal conduit and ileostomy would be difficult.  I turned the case back over to Dr. Yisroel Ramming who continued with creation of the ileal conduit.  Once this was done the abdomen was irrigated and checked for sponges.  I was unable to get the sigmoid colon  to mobilize to the previously marked ostomy site on he left side.  We decided to bring this down lower.  I mobilized up the white line of Toldt to allow for this mobilization.  We then brought the ostomy up through the abdominal cavity using a cruciate incision in the fascia and splitting the rectus muscle.  This was  able to rest at skin level.  I place a 71 [place] Blake drain into the pelvis over the rectal repair and brought this out through the right lower quadrant.  This was sutured into place with 2-0 nylon suture.  The fascia was then closed using a running looped PDS suture by Dr. [name].  The skin was closed with staples after irrigation of the wound.  The loop colostomy was matured over a red rubber catheter using interrupted 2-0 Vicryl sutures.  Ostomy appliances were applied.  The patient was awakened from anesthesia and sent to the stepdown unit in stable but guarded condition.

## 2017-08-09 NOTE — Progress Notes (Signed)
Patient ID: Kristopher Thompson, DDS, male   DOB: [birth year], 66 y.o.   MRN: [id number]  Pt doing well.  Pain remains controlled.  He has been tachycardic (110) today.  UOP is adequate.  Was a little dizzy when out of bed earlier.  Will continue to keep on telemetry and transfer to floor.

## 2017-08-09 NOTE — Progress Notes (Signed)
Urology Progress Note   1 Day Post-Op  Subjective: NAEON. Pain well controlled. Has not yet ambulated. No gas or stool in colostomy bag.   Objective: Vital signs in last 24 hours: Temp:  [97.6 F (36.4 C)-98.8 F (37.1 C)] 98.8 F (37.1 C) (09/28 0401) Pulse Rate:  [71-122] 105 (09/28 0600) Resp:  [12-22] 18 (09/28 0600) BP: (142-163)/(79-90) 148/87 (09/27 2015) SpO2:  [95 %-100 %] 99 % (09/28 0600) Weight:  [121.6 kg (268 lb)-122 kg (268 lb 15.4 oz)] 122 kg (268 lb 15.4 oz) (09/28 0401)  Intake/Output from previous day: 09/27 0701 - 09/28 0700 In: 7250 [I.V.:6000; IV Piggyback:1250] Out: 3810 [Urine:1750; Drains:580; Stool:75; Blood:1330] Intake/Output this shift: No intake/output data recorded.  Physical Exam:  General: Alert and oriented CV: RRR Lungs: Clear Abdomen: Soft, appropriately tender. Urostomy slightly retracted, pink pink and productive of clear pink urine. Colostomy pink, no appreciable output. Incisions c/d/i. JP x2 SS GU: Normal external genitalia Ext: NT, No erythema  Lab Results:  Recent Labs  08/08/17 1708 08/08/17 1734 08/08/17 1925  HGB 9.2* 11.2* 12.5*  HCT 27.0* 33.0* 36.6*   BMET  Recent Labs  08/08/17 1531  08/08/17 1734 08/08/17 1925  NA 142  < > 140 139  K 3.8  < > 4.6 4.2  CL  --   --   --  109  CO2  --   --   --  19*  GLUCOSE 146*  --   --  183*  BUN  --   --   --  13  CREATININE  --   --   --  1.16  CALCIUM  --   --   --  7.9*  < > = values in this interval not displayed.   Studies/Results: Dg Abd Portable 1v  Result Date: 08/08/2017 CLINICAL DATA:  Check ureteral stent placement. EXAM: PORTABLE ABDOMEN - 1 VIEW COMPARISON:  None. FINDINGS: Supine portable view the abdomen was obtained. Multiple catheters are superimposed upon the abdomen. There is no surgical note yet available with which to correlate the catheters seen on this film. Patient has apparently undergone cystectomy with ileal diversion. Two apparent ureteral  stents are identified with the proximal loop of each stent formed over the expected region of each respective renal pelvis. The stents then pass off the right edge of the film, presumably exiting through the ileal conduit. In the expected region of the stoma there is a drain or catheter in a circular configuration. There may be another surgical drain coiled in the inferior pelvis. A third circular catheter appearing structure overlies the left pelvis and is indeterminate. Skin staples overlie the right paramidline abdomen/pelvis and left paramidline upper pelvis. IMPRESSION: Multiple tubes/ catheters project over the abdomen without an operative note yet available with which to correlate. Two apparent ureteral stents are identified and each appears to have the proximal loop formed in the expected location of the respective renal pelvis. The distal ends of these catheters pass off the right side of the film. Electronically Signed   By: Misty Stanley M.D.   On: 08/08/2017 19:43    Assessment/Plan:  66 y.o. male s/p open radical cystectomy with ileal conduit urinary diversion c/b rectal injury s/p repair and loop colostomy formation by general surgery.  Overall doing well post-op.   - Continue current pain regimen - Satting well on 2L, continue to wean, IS, OOB - Slightly tachycardic, follow up Hgb this morning.  - Ice chips today, will advance diet slowly  given extensive bowel work. Received Entereg pre-op.  - PT, WOCN to evaluate.  - Lovenox ppx    Dispo: Floor today if labs stable   LOS: 1 day   Stasia Cavalier 08/09/2017, 7:38 AM

## 2017-08-09 NOTE — Progress Notes (Addendum)
Patient ID: Kristopher Thompson, DDS, male   DOB: 11-10-1951, 66 y.o.   MRN: [id number]  1 Day Post-Op Subjective: Pt doing well s/p radical cystectomy and ileal conduit with rectal repair and colostomy yesterday.  Hemodynamically stable overnight.  No nausea or vomiting.  Expected incision pain now controlled with PCA.  Objective: Vital signs in last 24 hours: Temp:  [97.6 F (36.4 C)-98.8 F (37.1 C)] 98.8 F (37.1 C) (09/28 0401) Pulse Rate:  [71-122] 105 (09/28 0600) Resp:  [12-22] 18 (09/28 0600) BP: (142-163)/(79-90) 148/87 (09/27 2015) SpO2:  [95 %-100 %] 99 % (09/28 0600) Weight:  [121.6 kg (268 lb)-122 kg (268 lb 15.4 oz)] 122 kg (268 lb 15.4 oz) (09/28 0401)  Intake/Output from previous day: 09/27 0701 - 09/28 0700 In: 7250 [I.V.:6000; IV Piggyback:1250] Out: 3762 [Urine:1750; Drains:580; Stool:75; Blood:1330] Intake/Output this shift: No intake/output data recorded.  Physical Exam:  General: Alert and oriented CV: RRR Lungs: Clear Abdomen: Soft, ND, No bowel sounds, colostomy and urostomy pink with both somewhat retracted due to abdominal size, no stool GU: Urostomy bag with light pink urine. Incisions: Dressing intact. Ext: NT, No erythema  Lab Results:  Recent Labs  08/08/17 1708 08/08/17 1734 08/08/17 1925  HGB 9.2* 11.2* 12.5*  HCT 27.0* 33.0* 36.6*   BMET  Recent Labs  08/08/17 1531  08/08/17 1734 08/08/17 1925  NA 142  < > 140 139  K 3.8  < > 4.6 4.2  CL  --   --   --  109  CO2  --   --   --  19*  GLUCOSE 146*  --   --  183*  BUN  --   --   --  13  CREATININE  --   --   --  1.16  CALCIUM  --   --   --  7.9*  < > = values in this interval not displayed.   Studies/Results: Dg Abd Portable 1v  Result Date: 08/08/2017 CLINICAL DATA:  Check ureteral stent placement. EXAM: PORTABLE ABDOMEN - 1 VIEW COMPARISON:  None. FINDINGS: Supine portable view the abdomen was obtained. Multiple catheters are superimposed upon the abdomen. There is no  surgical note yet available with which to correlate the catheters seen on this film. Patient has apparently undergone cystectomy with ileal diversion. Two apparent ureteral stents are identified with the proximal loop of each stent formed over the expected region of each respective renal pelvis. The stents then pass off the right edge of the film, presumably exiting through the ileal conduit. In the expected region of the stoma there is a drain or catheter in a circular configuration. There may be another surgical drain coiled in the inferior pelvis. A third circular catheter appearing structure overlies the left pelvis and is indeterminate. Skin staples overlie the right paramidline abdomen/pelvis and left paramidline upper pelvis. IMPRESSION: Multiple tubes/ catheters project over the abdomen without an operative note yet available with which to correlate. Two apparent ureteral stents are identified and each appears to have the proximal loop formed in the expected location of the respective renal pelvis. The distal ends of these catheters pass off the right side of the film. Electronically Signed   By: Misty Stanley M.D.   On: 08/08/2017 19:43    Assessment/Plan: POD # 1 s/p radical cystetcomy, BPLND, ileal conduit urinary diversion, rectal repair, diverting colostomy - OOB to chair this morning, will consider starting to ambulate later this afternoon if doing well and labs ok -  NPO, Entereg, await bowel function return - PCA for pain control, ketorolac - Await lab results (attempted lab draw this morning unsuccessful) - Monitor drains - WOC consult - Lovenox for DVT prophylaxis, IS   LOS: 1 day   Soffia Doshier,LES 08/09/2017, 7:40 AM

## 2017-08-09 NOTE — Consult Note (Signed)
[facility] Nurse ostomy consult note  Stoma type/location:  Ileal conduit RLQ/Loop colostomy LLQ   Stomal assessment/size:  Ileal conduit: oval shaped, flush with the skin, moist, 1" x 1 3/4" with red and blue stints in place Loop colostomy:oval shaped, flush with the skin, moist, 1" x 1 5/8" with red rubber catheter in place for stoma support  Peristomal assessment:  Ileal conduit: intact  Loop colostomy: intact, staples noted for small incision just to the left and proximal to the stoma   Treatment options for stomal/peristomal skin: used 2" barrier ring for both stomas today, due to abdominal contour issues, creasing and flush stomas   May need flexible convexity at some point once his wounds and drains are removed and we can assess abdominal contour issues with him sitting up.   Output  Loop colostomy; bloody drainage, minimal  Ileal conduit: dark yellow urine   Ostomy pouching:  Ileal conduit: 1pc flat urostomy pouch with 2" barrier ring to flatten pouching surface at 3 and 9 o'clock  Loop colostomy: 1pc flat drainable pouch with 2" barrier ring to flatten pouching surface at 3 and 9 o'clock    Education provided:  Left educational materials in the room for both colostomy, will ask next [facility] nurse who visits with patient to bring urostomy booklet.  Obtained signature for [employer], however until [facility] nurse team identifies which pouching systems may work I will not Product/process development scientist yet for enrollment. Wife very engaged to learn, son at the bedside but he was not involved in the teaching.  Demonstrated pouch change for both stomas, discussed the presence of the support rod and the stents for the ureters with her and the patient, however it should be noted that the patient is sleeping during my visit.  Explained rationale for use of barrier ring.  Demonstrated lock and roll closure for fecal pouch and patient's wife practiced this. Also allowed patient's wife to cut new skin barrier, she  is a Regulatory affairs officer and understands patterns and such. Explained using toilet paper wicks to clean the bottom of the pouch, did not demonstrate today, no stool in the pouch.  Left extra supplies in the room, convexity for both stomas should we need it.  Demonstrated how to hook urostomy pouch to BSD bag with patient's wife.  Will need much support with ostomies and his care initially at home, would suggest [facility] at the time of DC.   Enrolled patient in [place] program: No  WOC nurse team will follow along with you for support with ostomy care and teaching.   [place], [place], [place]

## 2017-08-09 NOTE — Addendum Note (Signed)
Addendum  created 08/09/17 0645 by Lollie Sails, CRNA   Charge Capture section accepted

## 2017-08-09 NOTE — Care Management Note (Signed)
Case Management Note  Patient Details  Name: Kristopher Thompson, DDS MRN: [id number] Date of Birth: 1951/04/01  Subjective/Objective:                  1 Day Post-Op Subjective: Pt doing well s/p radical cystectomy and ileal conduit with rectal repair and colostomy yesterday.  Hemodynamically stable overnight.  No nausea or vomiting.  Expected incision pain now controlled with PCA.   Action/Plan: Date:  August 09, 2017 Chart reviewed for concurrent status and case management needs.  Will continue to follow patient progress.  Discharge Planning: following for needs  Expected discharge date: August 12, 2017  Velva Harman, BSN, [place], [place]   Expected Discharge Date:                  Expected Discharge Plan:  Home/Self Care  In-House Referral:     Discharge planning Services  CM Consult  Post Acute Care Choice:    Choice offered to:     DME Arranged:    DME Agency:     HH Arranged:    HH Agency:     Status of Service:  In process, will continue to follow  If discussed at Long Length of Stay Meetings, dates discussed:    Additional Comments:  Leeroy Cha, RN 08/09/2017, 9:13 AM

## 2017-08-10 DIAGNOSIS — Z933 Colostomy status: Secondary | ICD-10-CM

## 2017-08-10 LAB — CBC
HEMATOCRIT: 30.2 % — AB (ref 39.0–52.0)
Hemoglobin: 10.2 g/dL — ABNORMAL LOW (ref 13.0–17.0)
MCH: 29 pg (ref 26.0–34.0)
MCHC: 33.8 g/dL (ref 30.0–36.0)
MCV: 85.8 fL (ref 78.0–100.0)
PLATELETS: 131 10*3/uL — AB (ref 150–400)
RBC: 3.52 MIL/uL — AB (ref 4.22–5.81)
RDW: 13.7 % (ref 11.5–15.5)
WBC: 13.4 10*3/uL — AB (ref 4.0–10.5)

## 2017-08-10 LAB — BASIC METABOLIC PANEL
Anion gap: 6 (ref 5–15)
BUN: 12 mg/dL (ref 6–20)
CHLORIDE: 105 mmol/L (ref 101–111)
CO2: 25 mmol/L (ref 22–32)
CREATININE: 0.9 mg/dL (ref 0.61–1.24)
Calcium: 7.5 mg/dL — ABNORMAL LOW (ref 8.9–10.3)
Glucose, Bld: 123 mg/dL — ABNORMAL HIGH (ref 65–99)
POTASSIUM: 4.3 mmol/L (ref 3.5–5.1)
SODIUM: 136 mmol/L (ref 135–145)

## 2017-08-10 MED ORDER — ACETAMINOPHEN 500 MG PO TABS
1000.0000 mg | ORAL_TABLET | Freq: Three times a day (TID) | ORAL | Status: DC
  Administered 2017-08-10 – 2017-08-13 (×9): 1000 mg via ORAL
  Filled 2017-08-10 (×10): qty 2

## 2017-08-10 MED ORDER — PIPERACILLIN-TAZOBACTAM 3.375 G IVPB
3.3750 g | Freq: Three times a day (TID) | INTRAVENOUS | Status: DC
  Administered 2017-08-10 – 2017-08-11 (×6): 3.375 g via INTRAVENOUS
  Filled 2017-08-10 (×6): qty 50

## 2017-08-10 MED ORDER — LACTATED RINGERS IV BOLUS (SEPSIS): 1000.0000 mL | Freq: Three times a day (TID) | INTRAVENOUS | Status: DC | PRN

## 2017-08-10 MED ORDER — ALUM & MAG HYDROXIDE-SIMETH 200-200-20 MG/5ML PO SUSP: 30.0000 mL | Freq: Four times a day (QID) | ORAL | Status: DC | PRN

## 2017-08-10 MED ORDER — CIPROFLOXACIN IN D5W 400 MG/200ML IV SOLN: 400.0000 mg | Freq: Two times a day (BID) | INTRAVENOUS | Status: DC

## 2017-08-10 MED ORDER — METRONIDAZOLE IN NACL 5-0.79 MG/ML-% IV SOLN
500.0000 mg | Freq: Three times a day (TID) | INTRAVENOUS | Status: DC
  Administered 2017-08-10: 500 mg via INTRAVENOUS
  Filled 2017-08-10: qty 100

## 2017-08-10 MED ORDER — GUAIFENESIN-DM 100-10 MG/5ML PO SYRP: 10.0000 mL | ORAL_SOLUTION | ORAL | Status: DC | PRN

## 2017-08-10 MED ORDER — PHENOL 1.4 % MT LIQD
1.0000 | OROMUCOSAL | Status: DC | PRN
  Filled 2017-08-10: qty 177

## 2017-08-10 MED ORDER — HYDROCORTISONE 2.5 % RE CREA
1.0000 "application " | TOPICAL_CREAM | Freq: Four times a day (QID) | RECTAL | Status: DC | PRN
  Filled 2017-08-10: qty 28.35

## 2017-08-10 MED ORDER — SACCHAROMYCES BOULARDII 250 MG PO CAPS
250.0000 mg | ORAL_CAPSULE | Freq: Two times a day (BID) | ORAL | Status: DC
Start: 2017-08-10 — End: 2017-08-14
  Administered 2017-08-10 – 2017-08-14 (×9): 250 mg via ORAL
  Filled 2017-08-10 (×8): qty 1

## 2017-08-10 MED ORDER — METHOCARBAMOL 1000 MG/10ML IJ SOLN
1000.0000 mg | Freq: Four times a day (QID) | INTRAVENOUS | Status: DC | PRN
  Filled 2017-08-10: qty 10

## 2017-08-10 MED ORDER — HYDRALAZINE HCL 20 MG/ML IJ SOLN: 5.0000 mg | Freq: Four times a day (QID) | INTRAMUSCULAR | Status: DC | PRN

## 2017-08-10 MED ORDER — HYDROCORTISONE 1 % EX CREA
1.0000 "application " | TOPICAL_CREAM | Freq: Three times a day (TID) | CUTANEOUS | Status: DC | PRN
  Filled 2017-08-10: qty 28

## 2017-08-10 MED ORDER — LIP MEDEX EX OINT
1.0000 "application " | TOPICAL_OINTMENT | Freq: Two times a day (BID) | CUTANEOUS | Status: DC
  Administered 2017-08-10 – 2017-08-13 (×5): 1 via TOPICAL
  Filled 2017-08-10: qty 7

## 2017-08-10 MED ORDER — MAGIC MOUTHWASH
15.0000 mL | Freq: Four times a day (QID) | ORAL | Status: DC | PRN
Start: 2017-08-10 — End: 2017-08-14
  Administered 2017-08-10: 15 mL via ORAL
  Filled 2017-08-10: qty 15

## 2017-08-10 MED ORDER — SIMETHICONE 40 MG/0.6ML PO SUSP
40.0000 mg | Freq: Four times a day (QID) | ORAL | Status: DC | PRN
  Administered 2017-08-10: 40 mg via ORAL
  Filled 2017-08-10: qty 0.6

## 2017-08-10 MED ORDER — PROCHLORPERAZINE EDISYLATE 5 MG/ML IJ SOLN: 5.0000 mg | INTRAMUSCULAR | Status: DC | PRN

## 2017-08-10 MED ORDER — MENTHOL 3 MG MT LOZG
1.0000 | LOZENGE | OROMUCOSAL | Status: DC | PRN
  Filled 2017-08-10: qty 9

## 2017-08-10 MED ORDER — GABAPENTIN 300 MG PO CAPS
300.0000 mg | ORAL_CAPSULE | Freq: Every day | ORAL | Status: AC
  Administered 2017-08-10 – 2017-08-12 (×3): 300 mg via ORAL
  Filled 2017-08-10 (×3): qty 1

## 2017-08-10 MED ORDER — PIPERACILLIN-TAZOBACTAM 3.375 G IVPB 30 MIN: 3.3750 g | Freq: Three times a day (TID) | INTRAVENOUS | Status: DC

## 2017-08-10 NOTE — Progress Notes (Signed)
2 Days Post-Op   Subjective/Chief Complaint:  1 - Bladder Cancer - s/p open cystectomy  + loop colostomy 9/27 in combined Urol Kristopher Thompson) and Gen surg Kristopher Thompson) surgery. Observed stepdown initially. Transferred to med surg floor 9/28.  2 - Loop Colostomy / Post-op Ileus - s/p rectal repair and loop colostomy at time of surgery 9/27. NPO initially. Ice chips added 9/29. Received entereg peri-op.   3 - Low Grade Fever - Fevers Tm 100.5 or less POD 2. Had bowel + bladder surgery. Empiric Zosyn started 9/29. No localizing wound problems.   4 - Disposition / Rehab - pt indepentant at baseline. PT eval ordered 9/29 to help with DC planning / DM. Ostomy RN also following.  Today "Kristopher Thompson" is stable. Sat in chair once yesterday. Pain controlled. No emesis or wound problems.   Objective: Vital signs in last 24 hours: Temp:  [99.2 F (37.3 C)-100.3 F (37.9 C)] 100.3 F (37.9 C) (09/29 0356) Pulse Rate:  [102-113] 102 (09/29 0356) Resp:  [16-24] 18 (09/29 0356) BP: (116-133)/(59-69) 124/67 (09/29 0356) SpO2:  [94 %-100 %] 94 % (09/29 0356) Last BM Date: 08/07/17  Intake/Output from previous day: 09/28 0701 - 09/29 0700 In: 5878.1 [I.V.:5778.1; IV Piggyback:100] Out: 1640 [Urine:1425; Drains:215] Intake/Output this shift: No intake/output data recorded.  General appearance: alert, cooperative, appears stated age and family at bedside Eyes: negative Nose: Nares normal. Septum midline. Mucosa normal. No drainage or sinus tenderness. Throat: lips, mucosa, and tongue normal; teeth and gums normal Neck: supple, symmetrical, trachea midline Back: symmetric, no curvature. ROM normal. No CVA tenderness. Resp: non-labored on minimal [place] O2. Cardio: mild regular tachycardia.  GI: soft, non-tender; bowel sounds normal; no masses,  no organomegaly and moderate truncal obesity.  Male genitalia: normal Extremities: extremities normal, atraumatic, no cyanosis or edema Pulses: 2+ and symmetric Lymph  nodes: Cervical, supraclavicular, and axillary nodes normal. Neurologic: Grossly normal Incision/Wound: midline incision c/d/i. RLQ Urostomy pink / patent with Rt (red) and Lt (blue) distal bander stents and copious light pink urine. LLQ loop colostomy patent of scant serous fluid, no gas or stool. JP with non-foul serosnguinous fluid x2.   Lab Results:   Recent Labs  08/09/17 0843 08/10/17 0450  WBC 12.2* 13.4*  HGB 10.9* 10.2*  HCT 31.2* 30.2*  PLT 150 131*   BMET  Recent Labs  08/09/17 0843 08/10/17 0450  NA 138 136  K 3.8 4.3  CL 108 105  CO2 25 25  GLUCOSE 129* 123*  BUN 14 12  CREATININE 0.81 0.90  CALCIUM 7.6* 7.5*   PT/INR No results for input(s): LABPROT, INR in the last 72 hours. ABG  Recent Labs  08/08/17 1708 08/08/17 1734  PHART  --  7.313*  HCO3 18.8* 22.5    Studies/Results: Dg Abd Portable 1v  Result Date: 08/08/2017 CLINICAL DATA:  Check ureteral stent placement. EXAM: PORTABLE ABDOMEN - 1 VIEW COMPARISON:  None. FINDINGS: Supine portable view the abdomen was obtained. Multiple catheters are superimposed upon the abdomen. There is no surgical note yet available with which to correlate the catheters seen on this film. Patient has apparently undergone cystectomy with ileal diversion. Two apparent ureteral stents are identified with the proximal loop of each stent formed over the expected region of each respective renal pelvis. The stents then pass off the right edge of the film, presumably exiting through the ileal conduit. In the expected region of the stoma there is a drain or catheter in a circular configuration. There may be another  surgical drain coiled in the inferior pelvis. A third circular catheter appearing structure overlies the left pelvis and is indeterminate. Skin staples overlie the right paramidline abdomen/pelvis and left paramidline upper pelvis. IMPRESSION: Multiple tubes/ catheters project over the abdomen without an operative note yet  available with which to correlate. Two apparent ureteral stents are identified and each appears to have the proximal loop formed in the expected location of the respective renal pelvis. The distal ends of these catheters pass off the right side of the film. Electronically Signed   By: Kristopher Thompson M.D.   On: 08/08/2017 19:43    Anti-infectives: Anti-infectives    Start     Dose/Rate Route Frequency Ordered Stop   08/10/17 0800  ciprofloxacin (CIPRO) IVPB 400 mg  Status:  Discontinued     400 mg 200 mL/hr over 60 Minutes Intravenous Every 12 hours 08/10/17 0604 08/10/17 0646   08/10/17 0800  metroNIDAZOLE (FLAGYL) IVPB 500 mg     500 mg 100 mL/hr over 60 Minutes Intravenous Every 8 hours 08/10/17 0604     08/10/17 0800  piperacillin-tazobactam (ZOSYN) IVPB 3.375 g     3.375 g 12.5 mL/hr over 240 Minutes Intravenous Every 8 hours 08/10/17 0648     08/10/17 0700  piperacillin-tazobactam (ZOSYN) IVPB 3.375 g  Status:  Discontinued     3.375 g 100 mL/hr over 30 Minutes Intravenous Every 8 hours 08/10/17 0646 08/10/17 0647   08/08/17 2359  cefOXitin (MEFOXIN) 1 g in dextrose 5 % 50 mL IVPB     1 g 100 mL/hr over 30 Minutes Intravenous Every 8 hours 08/08/17 1959 08/09/17 1216   08/08/17 0810  cefOXitin (MEFOXIN) 2 g in dextrose 5 % 50 mL IVPB     2 g 100 mL/hr over 30 Minutes Intravenous 30 min pre-op 08/08/17 0810 08/09/17 1052      Assessment/Plan:  1 - Bladder Cancer - path pending, no further cancer directed therapy this admission. All current tubes / lines to remain.   2 - Loop Colostomy / Post-op Ileus - continue ice chips for now. Advance to clears possibly tomorrow or per gen surg recs.   3 - Low Grade Fever -  DDX atelectasis v. Early wound / abd infection. IS reinfoced, start empiric zosyn.   4 - Disposition / Rehab - PT eval today. Appreciate ostomy RN support.   [facility], Kristopher Thompson 08/10/2017

## 2017-08-10 NOTE — Progress Notes (Signed)
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 M.D. 1.  Radical cystectomy 2.  Bilateral extended pelvic lymphadenectomy 3.  Ileal conduit urinary diversion 4.  Adhesiolysis  Surgeon: Dr Leighton Ruff, MD REPAIR OF RECTUM DIVERTING LOOP COLOSTOMY    Assessment  Ileus with major surgery  Plan:  -sips of clears - low volume.  Advance if has flatus -PCA, toradol, tylenol for pain control w PRN breakthrough -colostomy care -urostomy care -VTE prophylaxis- SCDs, etc -mobilize as tolerated to help recovery - try to get him up.  PT/RN/CNA.  20 minutes spent in review, evaluation, examination, counseling, and coordination of care.  More than 50% of that time was spent in counseling.  Adin Hector, M.D., F.A.C.S. Gastrointestinal and Minimally Invasive Surgery [facility] [address], [place], [place] [address] [phone] Main / Paging   08/10/2017    Subjective: (Chief complaint)  Sore  Not gotten up yet Thirsty - denies  nausea  Objective:  Vital signs:  Vitals:   08/10/17 0356 08/10/17 0800 08/10/17 0940 08/10/17 1000  BP: 124/67   (!) 104/58  Pulse: (!) 102   97  Resp: '18 17 17 18  ' Temp: 100.3 F (37.9 C)   99.9 F (37.7 C)  TempSrc: Oral   Oral  SpO2: 94% 98% 98% 97%  Weight:      Height:        Last BM Date: 08/07/17  Intake/Output   Yesterday:  09/28 0701 - 09/29 0700 In: 5878.1 [I.V.:5778.1; IV Piggyback:100] Out: 1640 [Urine:1425; Drains:215] This shift:  No intake/output data recorded.  Bowel function:  Flatus: No  BM:  No  Drain: Serosanguinous   Physical Exam:  General: Pt awake/alert/oriented x4 in no acute distress Eyes: PERRL, normal EOM.  Sclera clear.  No icterus Neuro: CN II-XII intact w/o focal sensory/motor deficits. Lymph: No head/neck/groin lymphadenopathy Psych:  No delerium/psychosis/paranoia HENT: Normocephalic, Mucus membranes moist.  No thrush Neck: Supple, No tracheal deviation Chest: No chest wall pain w good excursion CV:  Pulses intact.  Regular rhythm MS: Normal AROM mjr joints.  No obvious deformity  Abdomen: Somewhat firm.  Moderately distended.  Mildly tender at incisions only.  No evidence of peritonitis.  No incarcerated hernias.  Urostomy pink with urine.  LLQ colostomy mildly & flat.  No ischemia.  No gas/stool in bag  Ext:  No deformity.  No mjr edema.  No cyanosis Skin: No petechiae / purpura  Results:   Labs: Results for orders placed or performed during the hospital encounter of 08/08/17 (from the past 48 hour(s))  I-STAT 4, (NA,K, GLUC, HGB,HCT)     Status: Abnormal  Collection Time: 08/08/17  3:31 PM  Result Value Ref Range   Sodium 142 135 - 145 mmol/L   Potassium 3.8 3.5 - 5.1 mmol/L   Glucose, Bld 146 (H) 65 - 99 mg/dL   HCT 39.0 39.0 - 52.0 %   Hemoglobin 13.3 13.0 - 17.0 g/dL  POCT I-Stat EG7     Status: Abnormal   Collection Time: 08/08/17  5:08 PM  Result Value Ref Range   pH, Ven 7.247 (L) 7.250 - 7.430   pCO2,  Ven 42.7 (L) 44.0 - 60.0 mmHg   pO2, Ven 48.0 (H) 32.0 - 45.0 mmHg   Bicarbonate 18.8 (L) 20.0 - 28.0 mmol/L   TCO2 20 (L) 22 - 32 mmol/L   O2 Saturation 79.0 %   Acid-base deficit 8.0 (H) 0.0 - 2.0 mmol/L   Sodium 141 135 - 145 mmol/L   Potassium 4.2 3.5 - 5.1 mmol/L   Calcium, Ion 1.10 (L) 1.15 - 1.40 mmol/L   HCT 27.0 (L) 39.0 - 52.0 %   Hemoglobin 9.2 (L) 13.0 - 17.0 g/dL   Patient temperature 36.0 C    Sample type VENOUS   I-STAT 7, (LYTES, BLD GAS, ICA, H+H)     Status: Abnormal   Collection Time: 08/08/17  5:34 PM  Result Value Ref Range   pH, Arterial 7.313 (L) 7.350 - 7.450   pCO2 arterial 43.9 32.0 - 48.0 mmHg   pO2, Arterial 116.0 (H) 83.0 - 108.0 mmHg   Bicarbonate 22.5 20.0 - 28.0 mmol/L   TCO2 24 22 - 32 mmol/L   O2 Saturation 98.0 %   Acid-base deficit 4.0 (H) 0.0 - 2.0 mmol/L   Sodium 140 135 - 145 mmol/L   Potassium 4.6 3.5 - 5.1 mmol/L   Calcium, Ion 1.09 (L) 1.15 - 1.40 mmol/L   HCT 33.0 (L) 39.0 - 52.0 %   Hemoglobin 11.2 (L) 13.0 - 17.0 g/dL   Patient temperature 36.0 C    Sample type ARTERIAL   Basic metabolic panel     Status: Abnormal   Collection Time: 08/08/17  7:25 PM  Result Value Ref Range   Sodium 139 135 - 145 mmol/L   Potassium 4.2 3.5 - 5.1 mmol/L   Chloride 109 101 - 111 mmol/L   CO2 19 (L) 22 - 32 mmol/L   Glucose, Bld 183 (H) 65 - 99 mg/dL   BUN 13 6 - 20 mg/dL   Creatinine, Ser 1.16 0.61 - 1.24 mg/dL   Calcium 7.9 (L) 8.9 - 10.3 mg/dL   GFR calc non Af Amer >60 >60 mL/min   GFR calc Af Amer >60 >60 mL/min    Comment: (NOTE) The eGFR has been calculated using the CKD EPI equation. This calculation has not been validated in all clinical situations. eGFR's persistently <60 mL/min signify possible Chronic Kidney Disease.    Anion gap 11 5 - 15  Hemoglobin and hematocrit, blood     Status: Abnormal   Collection Time: 08/08/17  7:25 PM  Result Value Ref Range   Hemoglobin 12.5 (L) 13.0 - 17.0 g/dL   HCT 36.6 (L) 39.0 - 52.0 %  CBC      Status: Abnormal   Collection Time: 08/09/17  8:43 AM  Result Value Ref Range   WBC 12.2 (H) 4.0 - 10.5 K/uL   RBC 3.69 (L) 4.22 - 5.81 MIL/uL   Hemoglobin 10.9 (L) 13.0 - 17.0 g/dL   HCT 31.2 (L) 39.0 - 52.0 %   MCV 84.6 78.0 -  100.0 fL   MCH 29.5 26.0 - 34.0 pg   MCHC 34.9 30.0 - 36.0 g/dL   RDW 13.4 11.5 - 15.5 %   Platelets 150 150 - 400 K/uL  Basic metabolic panel     Status: Abnormal   Collection Time: 08/09/17  8:43 AM  Result Value Ref Range   Sodium 138 135 - 145 mmol/L   Potassium 3.8 3.5 - 5.1 mmol/L   Chloride 108 101 - 111 mmol/L   CO2 25 22 - 32 mmol/L   Glucose, Bld 129 (H) 65 - 99 mg/dL   BUN 14 6 - 20 mg/dL   Creatinine, Ser 0.81 0.61 - 1.24 mg/dL   Calcium 7.6 (L) 8.9 - 10.3 mg/dL   GFR calc non Af Amer >60 >60 mL/min   GFR calc Af Amer >60 >60 mL/min    Comment: (NOTE) The eGFR has been calculated using the CKD EPI equation. This calculation has not been validated in all clinical situations. eGFR's persistently <60 mL/min signify possible Chronic Kidney Disease.    Anion gap 5 5 - 15  Basic metabolic panel     Status: Abnormal   Collection Time: 08/10/17  4:50 AM  Result Value Ref Range   Sodium 136 135 - 145 mmol/L   Potassium 4.3 3.5 - 5.1 mmol/L   Chloride 105 101 - 111 mmol/L   CO2 25 22 - 32 mmol/L   Glucose, Bld 123 (H) 65 - 99 mg/dL   BUN 12 6 - 20 mg/dL   Creatinine, Ser 0.90 0.61 - 1.24 mg/dL   Calcium 7.5 (L) 8.9 - 10.3 mg/dL   GFR calc non Af Amer >60 >60 mL/min   GFR calc Af Amer >60 >60 mL/min    Comment: (NOTE) The eGFR has been calculated using the CKD EPI equation. This calculation has not been validated in all clinical situations. eGFR's persistently <60 mL/min signify possible Chronic Kidney Disease.    Anion gap 6 5 - 15  CBC     Status: Abnormal   Collection Time: 08/10/17  4:50 AM  Result Value Ref Range   WBC 13.4 (H) 4.0 - 10.5 K/uL   RBC 3.52 (L) 4.22 - 5.81 MIL/uL   Hemoglobin 10.2 (L) 13.0 - 17.0 g/dL   HCT 30.2  (L) 39.0 - 52.0 %   MCV 85.8 78.0 - 100.0 fL   MCH 29.0 26.0 - 34.0 pg   MCHC 33.8 30.0 - 36.0 g/dL   RDW 13.7 11.5 - 15.5 %   Platelets 131 (L) 150 - 400 K/uL    Imaging / Studies: Dg Abd Portable 1v  Result Date: 08/08/2017 CLINICAL DATA:  Check ureteral stent placement. EXAM: PORTABLE ABDOMEN - 1 VIEW COMPARISON:  None. FINDINGS: Supine portable view the abdomen was obtained. Multiple catheters are superimposed upon the abdomen. There is no surgical note yet available with which to correlate the catheters seen on this film. Patient has apparently undergone cystectomy with ileal diversion. Two apparent ureteral stents are identified with the proximal loop of each stent formed over the expected region of each respective renal pelvis. The stents then pass off the right edge of the film, presumably exiting through the ileal conduit. In the expected region of the stoma there is a drain or catheter in a circular configuration. There may be another surgical drain coiled in the inferior pelvis. A third circular catheter appearing structure overlies the left pelvis and is indeterminate. Skin staples overlie the right paramidline abdomen/pelvis and left paramidline  upper pelvis. IMPRESSION: Multiple tubes/ catheters project over the abdomen without an operative note yet available with which to correlate. Two apparent ureteral stents are identified and each appears to have the proximal loop formed in the expected location of the respective renal pelvis. The distal ends of these catheters pass off the right side of the film. Electronically Signed   By: Misty Stanley M.D.   On: 08/08/2017 19:43    Medications / Allergies: per chart  Antibiotics: Anti-infectives    Start     Dose/Rate Route Frequency Ordered Stop   08/10/17 0800  ciprofloxacin (CIPRO) IVPB 400 mg  Status:  Discontinued     400 mg 200 mL/hr over 60 Minutes Intravenous Every 12 hours 08/10/17 0604 08/10/17 0646   08/10/17 0800   metroNIDAZOLE (FLAGYL) IVPB 500 mg  Status:  Discontinued     500 mg 100 mL/hr over 60 Minutes Intravenous Every 8 hours 08/10/17 0604 08/10/17 0915   08/10/17 0800  piperacillin-tazobactam (ZOSYN) IVPB 3.375 g     3.375 g 12.5 mL/hr over 240 Minutes Intravenous Every 8 hours 08/10/17 0648     08/10/17 0700  piperacillin-tazobactam (ZOSYN) IVPB 3.375 g  Status:  Discontinued     3.375 g 100 mL/hr over 30 Minutes Intravenous Every 8 hours 08/10/17 0646 08/10/17 0647   08/08/17 2359  cefOXitin (MEFOXIN) 1 g in dextrose 5 % 50 mL IVPB     1 g 100 mL/hr over 30 Minutes Intravenous Every 8 hours 08/08/17 1959 08/09/17 1216   08/08/17 0810  cefOXitin (MEFOXIN) 2 g in dextrose 5 % 50 mL IVPB     2 g 100 mL/hr over 30 Minutes Intravenous 30 min pre-op 08/08/17 0810 08/09/17 1052        Note: Portions of this report may have been transcribed using voice recognition software. Every effort was made to ensure accuracy; however, inadvertent computerized transcription errors may be present.   Any transcriptional errors that result from this process are unintentional.     Adin Hector, M.D., F.A.C.S. Gastrointestinal and Minimally Invasive Surgery [facility] [address] [place], [place] [address] [phone] Main / Paging   08/10/2017

## 2017-08-11 DIAGNOSIS — K219 Gastro-esophageal reflux disease without esophagitis: Secondary | ICD-10-CM

## 2017-08-11 DIAGNOSIS — E669 Obesity, unspecified: Secondary | ICD-10-CM

## 2017-08-11 LAB — CBC
HEMATOCRIT: 30.1 % — AB (ref 39.0–52.0)
Hemoglobin: 10.3 g/dL — ABNORMAL LOW (ref 13.0–17.0)
MCH: 29.2 pg (ref 26.0–34.0)
MCHC: 34.2 g/dL (ref 30.0–36.0)
MCV: 85.3 fL (ref 78.0–100.0)
Platelets: 143 10*3/uL — ABNORMAL LOW (ref 150–400)
RBC: 3.53 MIL/uL — ABNORMAL LOW (ref 4.22–5.81)
RDW: 13.5 % (ref 11.5–15.5)
WBC: 12.8 10*3/uL — ABNORMAL HIGH (ref 4.0–10.5)

## 2017-08-11 LAB — BASIC METABOLIC PANEL
Anion gap: 7 (ref 5–15)
BUN: 10 mg/dL (ref 6–20)
CALCIUM: 7.8 mg/dL — AB (ref 8.9–10.3)
CO2: 24 mmol/L (ref 22–32)
Chloride: 107 mmol/L (ref 101–111)
Creatinine, Ser: 0.85 mg/dL (ref 0.61–1.24)
GFR calc Af Amer: >60 mL/min (ref >60)
GLUCOSE: 114 mg/dL — AB (ref 65–99)
Potassium: 3.9 mmol/L (ref 3.5–5.1)
Sodium: 138 mmol/L (ref 135–145)

## 2017-08-11 MED ORDER — PANTOPRAZOLE SODIUM 40 MG PO TBEC
40.0000 mg | DELAYED_RELEASE_TABLET | Freq: Every day | ORAL | Status: DC
  Administered 2017-08-11 – 2017-08-13 (×3): 40 mg via ORAL
  Filled 2017-08-11 (×4): qty 1

## 2017-08-11 NOTE — Progress Notes (Signed)
[facility]  [address] [place], [place] [address] Phone: [phone]  FAX: [place], [place] [phone] 12/05/1950  CARE TEAM:  PCP: Lorene Dy, MD  Outpatient Care Team: Patient Care Team: Lorene Dy, MD as PCP - General (Internal Medicine)  Inpatient Treatment Team: Treatment Team: Attending Provider: Raynelle Bring, MD; Consulting Physician: Leighton Ruff, MD; Registered Nurse: Nadara Mode, RN; Physician Assistant: Lattie Corns; Registered Nurse: Drucilla Chalet, RN; Registered Nurse: Gar Ponto, RN; Physician Assistant: Clinton Gallant, NT; Physical Therapist: Galen Manila, PT; Occupational Therapist: Mervin Hack, OT   Problem List:   Principal Problem:   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 M.D. 1.  Radical cystectomy 2.  Bilateral extended pelvic lymphadenectomy 3.  Ileal conduit urinary diversion 4.  Adhesiolysis  Surgeon: Dr Leighton Ruff, MD REPAIR OF RECTUM DIVERTING LOOP COLOSTOMY    Assessment  Ileus with major surgery but improving  Plan:  -PO clears - low volume.  Advance if has flatus.  Hopefully soon -PCA, toradol, tylenol for pain control w PRN breakthrough -colostomy care -urostomy care -f/u pathology -gerd - ppi -VTE prophylaxis- SCDs, etc -mobilize as tolerated to help recovery - try to get him up.  PT/RN/CNA.  20 minutes spent in review, evaluation, examination, counseling, and coordination of care.  More than 50% of that time was spent in counseling.  I updated the patient's status to the patient and spouse.  Dr [name]  as well.  Recommendations were made.  Questions were answered.  They expressed understanding & appreciation.   Adin Hector, M.D., F.A.C.S. Gastrointestinal and Minimally Invasive Surgery [facility] [address], [place], [place] [address] [phone] Main / Paging   08/11/2017    Subjective: (Chief complaint)  Walking with help Pain better controlled Tol clears - no N/V Wife in room Dr [name] in room  Objective:  Vital signs:  Vitals:   08/10/17 2051 08/10/17 2355 08/11/17 0148 08/11/17 0429  BP: 97/62  114/60 (!) 141/76  Pulse: 86  86 96  Resp: '17 16 12 17  ' Temp: 98.5 F (36.9 C)  99.6 F (37.6 C) 99.1 F (37.3 C)  TempSrc: Oral  Oral Oral  SpO2: 94% 97% 100% 99%  Weight:      Height:        Last BM Date: 08/07/17  Intake/Output   Yesterday:  09/29 0701 - 09/30 0700 In: 2159.2 [I.V.:2009.2; IV Piggyback:150] Out: 2350 [Urine:2250; Drains:100] This shift:  Total I/O In: 240 [P.O.:240] Out: 320 [Urine:320]  Bowel function:  Flatus: No  BM:  No  Drain: Serosanguinous   Physical Exam:  General: Pt awake/alert/oriented x4 in no acute distress.  Standing up in room Eyes: PERRL, normal EOM.  Sclera clear.  No icterus Neuro: CN II-XII intact w/o focal sensory/motor deficits. Lymph: No head/neck/groin lymphadenopathy Psych:  No delerium/psychosis/paranoia HENT: Normocephalic, Mucus membranes moist.  No thrush Neck: Supple, No tracheal deviation Chest: No chest wall pain w good excursion CV:  Pulses intact.  Regular rhythm MS: Normal AROM mjr joints.  No obvious deformity  Abdomen: Soft.  Nondistended.  Mildly tender at incisions only.  No evidence of peritonitis.  No incarcerated hernias.  Urostomy pink with urine.  LLQ colostomy flat.  Serosang fluid but no flatus/stool in colostomy bag.  No ischemia.    Ext:  No deformity.  No mjr edema.  No cyanosis Skin: No petechiae / purpura  Results:    Labs: Results for orders placed or performed during the hospital encounter of 08/08/17 (from the past 48 hour(s))  Basic metabolic panel     Status: Abnormal   Collection Time: 08/10/17  4:50 AM  Result Value Ref Range   Sodium 136 135 - 145 mmol/L   Potassium 4.3 3.5 - 5.1 mmol/L   Chloride 105 101 - 111 mmol/L   CO2 25 22 - 32 mmol/L   Glucose, Bld 123 (H) 65 - 99 mg/dL   BUN 12 6 - 20 mg/dL   Creatinine, Ser 0.90 0.61 - 1.24 mg/dL   Calcium 7.5 (L) 8.9 - 10.3 mg/dL   GFR calc non Af Amer >60 >60 mL/min   GFR calc Af Amer >60 >60 mL/min    Comment: (NOTE) The eGFR has been calculated using the CKD EPI equation. This calculation has not been validated in all clinical situations. eGFR's persistently <60 mL/min signify possible Chronic Kidney Disease.    Anion gap 6 5 - 15  CBC     Status: Abnormal   Collection Time: 08/10/17  4:50 AM  Result Value Ref Range   WBC 13.4 (H) 4.0 - 10.5 K/uL   RBC 3.52 (L) 4.22 - 5.81 MIL/uL   Hemoglobin 10.2 (L) 13.0 - 17.0 g/dL   HCT 30.2 (L) 39.0 - 52.0 %   MCV 85.8 78.0 - 100.0 fL   MCH 29.0 26.0 - 34.0 pg   MCHC 33.8 30.0 - 36.0 g/dL   RDW 13.7 11.5 - 15.5 %   Platelets 131 (L) 150 - 400 K/uL  Basic metabolic panel     Status: Abnormal   Collection Time: 08/11/17  6:40 AM  Result Value Ref Range   Sodium 138 135 - 145 mmol/L   Potassium 3.9 3.5 - 5.1 mmol/L   Chloride 107 101 - 111 mmol/L   CO2 24 22 - 32 mmol/L   Glucose, Bld 114 (H) 65 - 99 mg/dL   BUN 10 6 - 20 mg/dL   Creatinine, Ser 0.85 0.61 - 1.24 mg/dL   Calcium 7.8 (L) 8.9 - 10.3 mg/dL   GFR calc non Af Amer >60 >60 mL/min   GFR calc Af Amer >60 >60 mL/min    Comment: (NOTE) The eGFR has been calculated using the CKD EPI equation. This calculation has not been validated in all clinical situations. eGFR's persistently <60 mL/min signify possible Chronic Kidney Disease.    Anion gap 7 5 - 15  CBC     Status: Abnormal   Collection Time: 08/11/17  6:40 AM  Result  Value Ref Range   WBC 12.8 (H) 4.0 - 10.5 K/uL   RBC 3.53 (L) 4.22 - 5.81 MIL/uL   Hemoglobin 10.3 (L) 13.0 - 17.0 g/dL   HCT 30.1 (L) 39.0 - 52.0 %   MCV 85.3 78.0 - 100.0 fL   MCH 29.2 26.0 - 34.0 pg   MCHC 34.2 30.0 - 36.0 g/dL   RDW 13.5 11.5 - 15.5 %   Platelets 143 (L) 150 - 400 K/uL    Imaging / Studies: No results found.  Medications / Allergies: per chart  Antibiotics: Anti-infectives  Start     Dose/Rate Route Frequency Ordered Stop   08/10/17 0800  ciprofloxacin (CIPRO) IVPB 400 mg  Status:  Discontinued     400 mg 200 mL/hr over 60 Minutes Intravenous Every 12 hours 08/10/17 0604 08/10/17 0646   08/10/17 0800  metroNIDAZOLE (FLAGYL) IVPB 500 mg  Status:  Discontinued     500 mg 100 mL/hr over 60 Minutes Intravenous Every 8 hours 08/10/17 0604 08/10/17 0915   08/10/17 0800  piperacillin-tazobactam (ZOSYN) IVPB 3.375 g     3.375 g 12.5 mL/hr over 240 Minutes Intravenous Every 8 hours 08/10/17 0648     08/10/17 0700  piperacillin-tazobactam (ZOSYN) IVPB 3.375 g  Status:  Discontinued     3.375 g 100 mL/hr over 30 Minutes Intravenous Every 8 hours 08/10/17 0646 08/10/17 0647   08/08/17 2359  cefOXitin (MEFOXIN) 1 g in dextrose 5 % 50 mL IVPB     1 g 100 mL/hr over 30 Minutes Intravenous Every 8 hours 08/08/17 1959 08/09/17 1216   08/08/17 0810  cefOXitin (MEFOXIN) 2 g in dextrose 5 % 50 mL IVPB     2 g 100 mL/hr over 30 Minutes Intravenous 30 min pre-op 08/08/17 0810 08/09/17 1052        Note: Portions of this report may have been transcribed using voice recognition software. Every effort was made to ensure accuracy; however, inadvertent computerized transcription errors may be present.   Any transcriptional errors that result from this process are unintentional.     Adin Hector, M.D., F.A.C.S. Gastrointestinal and Minimally Invasive Surgery [facility] [address], [place], [place] [address] [phone] Main /  Paging   08/11/2017

## 2017-08-11 NOTE — Evaluation (Signed)
Occupational Therapy Evaluation Patient Details Name: Kristopher Thompson, DDS MRN: [id number] DOB: May 09, 1951 Today's Date: 08/11/2017    History of Present Illness 67 y/o male s/p radical cystectomy and ileal conduit with rectal repair and colostomy (08/08/17) due to bladder cancer.   Clinical Impression   Pt was independent at baseline. Presents with anxiety about moving due to abdominal pain, but appeared more confident at end of session. Educated in bed mobility using log roll technique. Pt unable to access his feet for ADL due to abdominal pain. Demonstrates decreased activity tolerance and requires RW for ambulation at this point. Will follow acutely. May not require OT at discharge depending on his progress.    Follow Up Recommendations  Home health OT    Equipment Recommendations  None recommended by OT    Recommendations for Other Services       Precautions / Restrictions Precautions Precautions: Other (comment) Precaution Comments: abd surgery, 2 JP drains, urostomy, colostomy Restrictions Weight Bearing Restrictions: No      Mobility Bed Mobility Overal bed mobility: Needs Assistance Bed Mobility: Rolling;Sidelying to Sit Rolling: Min assist Sidelying to sit: Mod assist;+2 for physical assistance       General bed mobility comments: Pt needed A of 2 to achieve sitting from sidelying with cues for proper techniques and breathing techniques  Transfers Overall transfer level: Needs assistance Equipment used: Rolling walker (2 wheeled) Transfers: Sit to/from Stand Sit to Stand: Min guard         General transfer comment: cues for hand placement    Balance Overall balance assessment: Needs assistance   Sitting balance-Leahy Scale: Fair Sitting balance - Comments: leans posterior in chair to avoid abdominal pain     Standing balance-Leahy Scale: Poor Standing balance comment: requires RW due to pain                           ADL either  performed or assessed with clinical judgement   ADL Overall ADL's : Needs assistance/impaired Eating/Feeding: Sitting;Independent   Grooming: Sitting;Set up   Upper Body Bathing: Sitting;Moderate assistance   Lower Body Bathing: Sitting/lateral leans;Total assistance   Upper Body Dressing : Minimal assistance;Sitting   Lower Body Dressing: Sit to/from stand;Maximal assistance               Functional mobility during ADLs: +2 for safety/equipment;Minimal assistance;Rolling walker       Vision Baseline Vision/History: Wears glasses Wears Glasses: Reading only Patient Visual Report: No change from baseline       Perception     Praxis      Pertinent Vitals/Pain Pain Assessment: 0-10 Pain Score: 7  Pain Location: lower abdomen Pain Descriptors / Indicators: Sore Pain Intervention(s): Monitored during session     Hand Dominance Right   Extremity/Trunk Assessment Upper Extremity Assessment Upper Extremity Assessment: Overall WFL for tasks assessed   Lower Extremity Assessment Lower Extremity Assessment: Defer to PT evaluation       Communication Communication Communication: No difficulties   Cognition Arousal/Alertness: Awake/alert Behavior During Therapy: WFL for tasks assessed/performed Overall Cognitive Status: Within Functional Limits for tasks assessed                                 General Comments: Pt reports feeling a little loopy due to pain meds.   General Comments  educated on use of incentive spirometer and bracing during coughing  Exercises     Shoulder Instructions      Home Living Family/patient expects to be discharged to:: Private residence Living Arrangements: Spouse/significant other Available Help at Discharge: Family;Available 24 hours/day Type of Home: House Home Access: Ramped entrance     Home Layout: Two level;Able to live on main level with bedroom/bathroom (pt's bedroom is on the second  floor) Alternate Level Stairs-Number of Steps: flight Alternate Level Stairs-Rails: Left Bathroom Shower/Tub: Tub/shower unit;Walk-in shower (tub shower is downstairs)   Biochemist, clinical: Standard     Home Equipment: Cane - single point;Shower seat          Prior Functioning/Environment Level of Independence: Independent                 OT Problem List: Impaired balance (sitting and/or standing);Decreased activity tolerance;Decreased knowledge of use of DME or AE;Pain;Obesity      OT Treatment/Interventions: Self-care/ADL training;DME and/or AE instruction;Therapeutic activities;Patient/family education;Balance training    OT Goals(Current goals can be found in the care plan section) Acute Rehab OT Goals Patient Stated Goal: return to PLOF OT Goal Formulation: With patient Time For Goal Achievement: 08/25/17 Potential to Achieve Goals: Good ADL Goals Pt Will Perform Grooming: with supervision;standing Pt Will Perform Lower Body Bathing: with supervision;with adaptive equipment;sit to/from stand Pt Will Perform Lower Body Dressing: with supervision;with adaptive equipment;sit to/from stand Pt Will Perform Tub/Shower Transfer: Tub transfer;with supervision;ambulating;shower seat;rolling walker Additional ADL Goal #1: Pt will perform bed mobility with supervision using log roll technique for comfort.  OT Frequency: Min 2X/week   Barriers to D/C:            Co-evaluation PT/OT/SLP Co-Evaluation/Treatment: Yes Reason for Co-Treatment: For patient/therapist safety PT goals addressed during session: Mobility/safety with mobility OT goals addressed during session: ADL's and self-care      AM-PAC PT "6 Clicks" Daily Activity     Outcome Measure Help from another person eating meals?: None Help from another person taking care of personal grooming?: A Little Help from another person toileting, which includes using toliet, bedpan, or urinal?: A Little Help from another  person bathing (including washing, rinsing, drying)?: A Lot Help from another person to put on and taking off regular upper body clothing?: A Little Help from another person to put on and taking off regular lower body clothing?: A Lot 6 Click Score: 17   End of Session Equipment Utilized During Treatment: Rolling walker Nurse Communication: Mobility status  Activity Tolerance: Patient tolerated treatment well Patient left: in chair;with call bell/phone within reach;with family/visitor present  OT Visit Diagnosis: Unsteadiness on feet (R26.81);Pain                Time: 0925-0950 OT Time Calculation (min): 25 min Charges:  OT General Charges $OT Visit: 1 Visit OT Evaluation $OT Eval Moderate Complexity: 1 Mod G-Codes:     08-19-2017 Nestor Lewandowsky, OTR/L Pager: [phone]  Werner Lean, Haze Boyden 2017-08-19, 10:57 AM

## 2017-08-11 NOTE — Progress Notes (Signed)
3 Days Post-Op   Subjective/Chief Complaint:  1 - Bladder Cancer - s/p open cystectomy  + loop colostomy 9/27 in combined Urol [name]) and Gen surg Marcello Moores) surgery. Observed stepdown initially. Transferred to med surg floor 9/28.  2 - Loop Colostomy / Post-op Ileus - s/p rectal repair and loop colostomy at time of surgery 9/27. NPO initially. Ice chips added 9/29. Received entereg peri-op. Advanced to clears 9/30.   3 - Low Grade Fever - Fevers Tm 100.5 or less POD 2. Had bowel + bladder surgery. Empiric Zosyn started 9/29. No localizing symptoms / wound problems. Placed on empiric Zosyn.  4 - Disposition / Rehab - pt indepentant at baseline. PT eval ordered 9/29 to help with DC planning / DM. Ostomy RN also following.  Today "[name]" is stable. Feeling some bowel rumbling. Remains minimally ambulatory, just to chair yesterday. No emesis.    Objective: Vital signs in last 24 hours: Temp:  [98.5 F (36.9 C)-99.9 F (37.7 C)] 99.1 F (37.3 C) (09/30 0429) Pulse Rate:  [86-109] 96 (09/30 0429) Resp:  [12-19] 17 (09/30 0429) BP: (97-141)/(52-76) 141/76 (09/30 0429) SpO2:  [94 %-100 %] 99 % (09/30 0429) Last BM Date: 08/07/17  Intake/Output from previous day: 09/29 0701 - 09/30 0700 In: 2159.2 [I.V.:2009.2; IV Piggyback:150] Out: 2350 [Urine:2250; Drains:100] Intake/Output this shift: Total I/O In: 240 [P.O.:240] Out: 320 [Urine:320]  General appearance: alert, cooperative, appears stated age and family at bedside Eyes: negative Nose: Nares normal. Septum midline. Mucosa normal. No drainage or sinus tenderness. Throat: lips, mucosa, and tongue normal; teeth and gums normal Neck: supple, symmetrical, trachea midline Back: symmetric, no curvature. ROM normal. No CVA tenderness. Resp: non-labored on minimal [place] O2. Cardio: mild regular tachycardia.  GI: soft, non-tender; bowel sounds normal; no masses,  no organomegaly and moderate truncal obesity.  Male genitalia:  normal Extremities: extremities normal, atraumatic, no cyanosis or edema Pulses: 2+ and symmetric Lymph nodes: Cervical, supraclavicular, and axillary nodes normal. Neurologic: Grossly normal Incision/Wound: midline incision c/d/i with staples in place. RLQ Urostomy pink / patent with Rt (red) and Lt (blue) distal bander stents and copious light pink urine. LLQ loop colostomy patent of scant serous fluid, no gas or stool. JP with non-foul serosnguinous fluid x2.    Lab Results:   Recent Labs  08/10/17 0450 08/11/17 0640  WBC 13.4* 12.8*  HGB 10.2* 10.3*  HCT 30.2* 30.1*  PLT 131* 143*   BMET  Recent Labs  08/10/17 0450 08/11/17 0640  NA 136 138  K 4.3 3.9  CL 105 107  CO2 25 24  GLUCOSE 123* 114*  BUN 12 10  CREATININE 0.90 0.85  CALCIUM 7.5* 7.8*   PT/INR No results for input(s): LABPROT, INR in the last 72 hours. ABG  Recent Labs  08/08/17 1708 08/08/17 1734  PHART  --  7.313*  HCO3 18.8* 22.5    Studies/Results: No results found.  Anti-infectives: Anti-infectives    Start     Dose/Rate Route Frequency Ordered Stop   08/10/17 0800  ciprofloxacin (CIPRO) IVPB 400 mg  Status:  Discontinued     400 mg 200 mL/hr over 60 Minutes Intravenous Every 12 hours 08/10/17 0604 08/10/17 0646   08/10/17 0800  metroNIDAZOLE (FLAGYL) IVPB 500 mg  Status:  Discontinued     500 mg 100 mL/hr over 60 Minutes Intravenous Every 8 hours 08/10/17 0604 08/10/17 0915   08/10/17 0800  piperacillin-tazobactam (ZOSYN) IVPB 3.375 g     3.375 g 12.5 mL/hr over 240  Minutes Intravenous Every 8 hours 08/10/17 0648     08/10/17 0700  piperacillin-tazobactam (ZOSYN) IVPB 3.375 g  Status:  Discontinued     3.375 g 100 mL/hr over 30 Minutes Intravenous Every 8 hours 08/10/17 0646 08/10/17 0647   08/08/17 2359  cefOXitin (MEFOXIN) 1 g in dextrose 5 % 50 mL IVPB     1 g 100 mL/hr over 30 Minutes Intravenous Every 8 hours 08/08/17 1959 08/09/17 1216   08/08/17 0810  cefOXitin (MEFOXIN) 2 g  in dextrose 5 % 50 mL IVPB     2 g 100 mL/hr over 30 Minutes Intravenous 30 min pre-op 08/08/17 0810 08/09/17 1052      Assessment/Plan:  1 - Bladder Cancer - path pending, no further cancer directed therapy this admission. All current tubes / lines to remain.   2 - Loop Colostomy / Post-op Ileus - Continue clears for now until gas / stool.   3 - Low Grade Fever -  DDX atelectasis v. Early wound / abd infection. IS reinfoced, continue empiric zosyn.   4 - Disposition / Rehab - PT eval pending, reinforced importance of ambulation. Appreciate ostomy RN support.  [facility], Theodoro Koval 08/11/2017

## 2017-08-11 NOTE — Progress Notes (Signed)
Pt. Reported to this RN that he has passed gas. There is dark brown output in colostomy as well. Will continue to monitor closely.

## 2017-08-11 NOTE — Evaluation (Signed)
Physical Therapy Evaluation Patient Details Name: Kristopher Thompson, DDS MRN: [id number] DOB: 1951-03-15 Today's Date: 08/11/2017   History of Present Illness  66 y/o male s/p radical cystectomy and ileal conduit with rectal repair and colostomy (08/08/17) due to bladder cancer.  Clinical Impression  Pt admitted with above diagnosis. Pt currently with functional limitations due to the deficits listed below (see PT Problem List).  Pt will benefit from skilled PT to increase their independence and safety with mobility to allow discharge to the venue listed below.  Pt apprehensive to move, but with encouragement pt was able to ambulate 150' with RW.  He demonstrates the most difficulty with bed mobility. Pt educated on proper body mechanics, use of incentive spirometer and bracing during coughing.  He can live on 1st floor of house until he is stronger, but recommend HHPT to work towards returning to his independent PLOF.  Recommend RW and 3-1 BSC as well.     Follow Up Recommendations Home health PT    Equipment Recommendations  Rolling walker with 5" wheels;3in1 (PT)    Recommendations for Other Services       Precautions / Restrictions Precautions Precautions: Other (comment) Precaution Comments: abd surgery, 2 JP drains Restrictions Weight Bearing Restrictions: No      Mobility  Bed Mobility Overal bed mobility: Needs Assistance Bed Mobility: Rolling;Sidelying to Sit Rolling: Min assist Sidelying to sit: Mod assist;+2 for physical assistance       General bed mobility comments: Pt needed A of 2 to achieve sitting from sidelying with cues for proper techniques and breathing techniques  Transfers Overall transfer level: Needs assistance Equipment used: Rolling walker (2 wheeled) Transfers: Sit to/from Stand Sit to Stand: Min guard         General transfer comment: cues for hand placement  Ambulation/Gait Ambulation/Gait assistance: Min guard Ambulation Distance  (Feet): 150 Feet Assistive device: Rolling walker (2 wheeled) Gait Pattern/deviations: Step-through pattern Gait velocity: decreased   General Gait Details: Pt ambulated with steady slow cadence and heavy reliance on RW  Stairs            Wheelchair Mobility    Modified Rankin (Stroke Patients Only)       Balance Overall balance assessment: Needs assistance   Sitting balance-Leahy Scale: Good       Standing balance-Leahy Scale: Poor Standing balance comment: requires RW due to pain                             Pertinent Vitals/Pain Pain Assessment: 0-10 Pain Score: 2  Pain Location: lower abdomen Pain Descriptors / Indicators: Sore Pain Intervention(s): Limited activity within patient's tolerance;Monitored during session;PCA encouraged;Repositioned;Other (comment) (education on proper body mechanics)    Home Living Family/patient expects to be discharged to:: Private residence Living Arrangements: Spouse/significant other Available Help at Discharge: Family;Available 24 hours/day Type of Home: House Home Access: Ramped entrance     Home Layout: Two level;Able to live on main level with bedroom/bathroom (his bedroom is up , but can stay on main) Home Equipment: Cane - single point;Shower seat      Prior Function Level of Independence: Independent               Hand Dominance   Dominant Hand: Right    Extremity/Trunk Assessment   Upper Extremity Assessment Upper Extremity Assessment: Defer to OT evaluation    Lower Extremity Assessment Lower Extremity Assessment: Overall WFL for tasks assessed;Generalized weakness  Communication   Communication: No difficulties  Cognition Arousal/Alertness: Awake/alert Behavior During Therapy: WFL for tasks assessed/performed Overall Cognitive Status: Within Functional Limits for tasks assessed                                 General Comments: Pt reports feeling a little  loopy due to pain meds.      General Comments General comments (skin integrity, edema, etc.): educated on use of incentive spirometer and bracing during coughing    Exercises     Assessment/Plan    PT Assessment Patient needs continued PT services  PT Problem List Decreased activity tolerance;Decreased balance;Decreased mobility;Decreased knowledge of use of DME;Decreased knowledge of precautions;Pain       PT Treatment Interventions DME instruction;Gait training;Functional mobility training;Therapeutic activities;Therapeutic exercise;Balance training;Patient/family education    PT Goals (Current goals can be found in the Care Plan section)  Acute Rehab PT Goals Patient Stated Goal: return to PLOF PT Goal Formulation: With patient/family Time For Goal Achievement: 08/25/17 Potential to Achieve Goals: Good    Frequency Min 3X/week   Barriers to discharge        Co-evaluation PT/OT/SLP Co-Evaluation/Treatment: Yes Reason for Co-Treatment: For patient/therapist safety PT goals addressed during session: Mobility/safety with mobility         AM-PAC PT "6 Clicks" Daily Activity  Outcome Measure Difficulty turning over in bed (including adjusting bedclothes, sheets and blankets)?: Unable Difficulty moving from lying on back to sitting on the side of the bed? : Unable Difficulty sitting down on and standing up from a chair with arms (e.g., wheelchair, bedside commode, etc,.)?: A Little Help needed moving to and from a bed to chair (including a wheelchair)?: A Little Help needed walking in hospital room?: A Little Help needed climbing 3-5 steps with a railing? : A Lot 6 Click Score: 13    End of Session   Activity Tolerance: Patient tolerated treatment well Patient left: in chair;with call bell/phone within reach;with family/visitor present Nurse Communication: Mobility status PT Visit Diagnosis: Difficulty in walking, not elsewhere classified (R26.2);Other abnormalities  of gait and mobility (R26.89)    Time: 3329-5188 PT Time Calculation (min) (ACUTE ONLY): 36 min   Charges:   PT Evaluation $PT Eval Moderate Complexity: 1 Mod     PT G Codes:        Bahja Bence L. Tamala Julian, [place] Pager [phone] 08/11/2017   Galen Manila 08/11/2017, 10:06 AM

## 2017-08-12 LAB — BASIC METABOLIC PANEL
Anion gap: 6 (ref 5–15)
BUN: 11 mg/dL (ref 6–20)
CO2: 27 mmol/L (ref 22–32)
Calcium: 7.9 mg/dL — ABNORMAL LOW (ref 8.9–10.3)
Chloride: 108 mmol/L (ref 101–111)
Creatinine, Ser: 0.8 mg/dL (ref 0.61–1.24)
GFR calc Af Amer: >60 mL/min (ref >60)
Glucose, Bld: 115 mg/dL — ABNORMAL HIGH (ref 65–99)
POTASSIUM: 3.5 mmol/L (ref 3.5–5.1)
SODIUM: 141 mmol/L (ref 135–145)

## 2017-08-12 LAB — CBC
HEMATOCRIT: 28.8 % — AB (ref 39.0–52.0)
HEMOGLOBIN: 10 g/dL — AB (ref 13.0–17.0)
MCH: 28.7 pg (ref 26.0–34.0)
MCHC: 34.7 g/dL (ref 30.0–36.0)
MCV: 82.8 fL (ref 78.0–100.0)
Platelets: 188 10*3/uL (ref 150–400)
RBC: 3.48 MIL/uL — AB (ref 4.22–5.81)
RDW: 13.3 % (ref 11.5–15.5)
WBC: 10.2 10*3/uL (ref 4.0–10.5)

## 2017-08-12 LAB — CREATININE, FLUID (PLEURAL, PERITONEAL, JP DRAINAGE): Creat, Fluid: 1 mg/dL

## 2017-08-12 MED ORDER — HYDROCODONE-ACETAMINOPHEN 5-325 MG PO TABS
1.0000 | ORAL_TABLET | Freq: Four times a day (QID) | ORAL | Status: DC | PRN
  Administered 2017-08-12: 1 via ORAL
  Administered 2017-08-12 – 2017-08-14 (×6): 2 via ORAL
  Filled 2017-08-12 (×7): qty 2

## 2017-08-12 NOTE — Progress Notes (Signed)
Patient ID: Kristopher Thompson, DDS, male   DOB: [birth year], 66 y.o.   MRN: [id number]  4 Days Post-Op Subjective: Doing well.  He has made progress over the weekend.  Zosyn started empirically for prophylaxis. He has been afebrile last 48 hours.  Ambulating better.  Tolerating clears well.  No nausea.  Flatus and stool in colostomy bag last evening.  Objective: Vital signs in last 24 hours: Temp:  [98.4 F (36.9 C)-99 F (37.2 C)] 98.9 F (37.2 C) (10/01 0508) Pulse Rate:  [80-90] 82 (10/01 0508) Resp:  [12-20] 12 (10/01 0508) BP: (116-129)/(58-62) 120/58 (10/01 0508) SpO2:  [94 %-100 %] 99 % (10/01 0508)  Intake/Output from previous day: 09/30 0701 - 10/01 0700 In: 480 [P.O.:480] Out: 2110 [Urine:1920; Drains:65; Stool:125] Intake/Output this shift: No intake/output data recorded.  Physical Exam:  General: Alert and oriented CV: RRR Lungs: Clear Abdomen: Soft, ND, Good bowel sounds, Liquid brown stool in colostomy bag Incisions: Dry and Intact GU: Urine clear in urostomy bag, stents in place, urostomy pink Ext: NT, No erythema  Lab Results:  Recent Labs  08/10/17 0450 08/11/17 0640 08/12/17 0534  HGB 10.2* 10.3* 10.0*  HCT 30.2* 30.1* 28.8*   CBC Latest Ref Rng & Units 08/12/2017 08/11/2017 08/10/2017  WBC 4.0 - 10.5 K/uL 10.2 12.8(H) 13.4(H)  Hemoglobin 13.0 - 17.0 g/dL 10.0(L) 10.3(L) 10.2(L)  Hematocrit 39.0 - 52.0 % 28.8(L) 30.1(L) 30.2(L)  Platelets 150 - 400 K/uL 188 143(L) 131(L)     BMET  Recent Labs  08/11/17 0640 08/12/17 0534  NA 138 141  K 3.9 3.5  CL 107 108  CO2 24 27  GLUCOSE 114* 115*  BUN 10 11  CREATININE 0.85 0.80  CALCIUM 7.8* 7.9*     Studies/Results: No results found.  Assessment/Plan: 1 - Bladder Cancer - s/p open cystectomy  + loop colostomy 9/27 in combined Urol Kristopher Thompson) and Gen surg Kristopher Thompson) surgery. Observed stepdown initially. Transferred to med surg floor 9/28. Pathology pending.  2 - Loop Colostomy - s/p rectal  repair and loop colostomy at time of surgery 9/27. NPO initially. Ice chips added 9/29. Received entereg peri-op. Advanced to clears 9/30. Flatus and stool in colostomy bag on 9/30.  Will advance to full liquids.   3 - Low Grade Fever - Fevers Tm 100.5 or less POD 2. Had bowel + bladder surgery. Empiric Zosyn started 9/29. No localizing symptoms / wound problems. Placed on empiric Zosyn. WBC normalized and no fevers for 48 hrs.  Will stop Zosyn 10/1.  4 - Disposition / Rehab - Pt independent at baseline. PT eval ordered 9/29 to help with DC planning / DM. Ostomy RN also following. Continue to ambulate.  Will need home health care for ostomy care, Lovenox instruction as he will need this as outpatient for 30 days, and possible HH PT pending how he progresses the next couple of days.  Target d/c on Wednesday if continues to progress well.    LOS: 4 days   Kristopher Thompson,LES 08/12/2017, 7:10 AM

## 2017-08-12 NOTE — Progress Notes (Signed)
4 Days Post-Op distal rectal laceration repair with diverting sigmoid loop colostomy Subjective: Patient states pain is controlled.  No nausea.  Tolerating clears.  Air and stool in bag  Objective: Vital signs in last 24 hours: Temp:  [98.4 F (36.9 C)-99 F (37.2 C)] 98.9 F (37.2 C) (10/01 0508) Pulse Rate:  [80-90] 82 (10/01 0508) Resp:  [12-20] 12 (10/01 0508) BP: (116-129)/(58-62) 120/58 (10/01 0508) SpO2:  [94 %-100 %] 99 % (10/01 0508)   Intake/Output from previous day: 09/30 0701 - 10/01 0700 In: 2730 [P.O.:480; I.V.:2100; IV Piggyback:150] Out: 2110 [Urine:1920; Drains:65; Stool:125] Intake/Output this shift: No intake/output data recorded.  General appearance: alert and cooperative GI: normal findings: soft, mildly distended Colostomy: beefy red, slightly retracted Incision: clear blood tinged fluid draining from incision  Lab Results:   Recent Labs  08/11/17 0640 08/12/17 0534  WBC 12.8* 10.2  HGB 10.3* 10.0*  HCT 30.1* 28.8*  PLT 143* 188   BMET  Recent Labs  08/11/17 0640 08/12/17 0534  NA 138 141  K 3.9 3.5  CL 107 108  CO2 24 27  GLUCOSE 114* 115*  BUN 10 11  CREATININE 0.85 0.80  CALCIUM 7.8* 7.9*   PT/INR No results for input(s): LABPROT, INR in the last 72 hours. ABG No results for input(s): PHART, HCO3 in the last 72 hours.  Invalid input(s): PCO2, PO2  MEDS, Scheduled . acetaminophen  1,000 mg Oral TID  . alvimopan  12 mg Oral BID  . enoxaparin (LOVENOX) injection  40 mg Subcutaneous Q24H  . gabapentin  300 mg Oral QHS  . lip balm  1 application Topical BID  . pantoprazole  40 mg Oral Q1200  . saccharomyces boulardii  250 mg Oral BID    Studies/Results: No results found.  Assessment: s/p Procedure(s): XI ROBOTIC ASSISTED LAPAROSCOPIC RADICAL CYSTECTOMY COVERTED TO OPEN PELVIC LYMPHADNECTOMY BILATERAL AND ILEAL CONDUIT URINARY DIVERSION REPAIR OF RECTAL TEAR POSSIBLE PARTIAL PROCTECTOMY, CREATION OF  OSTOMY Patient  Active Problem List   Diagnosis Date Noted  . GERD (gastroesophageal reflux disease) 08/11/2017  . Obesity (BMI 30-39.9) 08/11/2017  . 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 S/P ileal conduit (Christopher) 08/08/2017  . 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 08/08/2017 08/08/2017    Expected post op course  Plan: Advance diet as tolerated  Ostomy RN to eval stomas and start teaching Monitor JP output Anticipate colostomy reversal once rectum has healed No need for antibiotics from my standpoint   Kristopher: 4 days     .Kristopher Thompson, [facility], [place]   08/12/2017 8:54 AM

## 2017-08-12 NOTE — Consult Note (Signed)
[facility] Nurse ostomy follow up Ileal conduit and LLQ colostomy Stomal assessment/size:  Ileal conduit: oval shaped, flush with the skin, moist, 1" x 1 3/4" with red and blue stints in place Loop colostomy:oval shaped, flush with the skin, moist, 1" x 1 5/8" with red rubber catheter in place for stoma support  Peristomal assessment:  Ileal conduit: intact  Loop colostomy: intact, staples noted for small incision just to the left and proximal to the stoma   Treatment options for stomal/peristomal skin: continued with  2" barrier ring for both stomas today, due to abdominal contour issues, creasing and flush stomas   Output  Loop colostomy; bloody drainage, minimal  Ileal conduit: clear yellow urine   Ostomy pouching:  Ileal conduit: 1pc flat urostomy pouch with 2" barrier ring to flatten pouching surface at 3 and 9 o'clock  Loop colostomy: 1pc flat drainable pouch with 2" barrier ring to flatten pouching surface at 3 and 9 o'clock    Education provided:  Wife very engaged to learn, assisted with urostomy pouch change and cut colostomy pouch off center using pattern. Demonstrated pouch change for both stomas, discussed the presence of the support rod and the stents for the ureters with her and the patient.  Patient and wife are engaged and ask appropriate questions. Explained rationale for use of barrier ring.  Demonstrated lock and roll closure for fecal pouch and patient's wife practiced this. Also allowed patient's wife to cut new skin barrier, she is a Regulatory affairs officer and understands patterns and such. demonstrated using toilet paper wicks to clean the bottom of the pouch.  Left extra supplies in the room, convexity for both stomas should we need it.  Patient is connected to BSD bag and understands to disconnect when getting OOB.  Will need much support with ostomies and his care initially at home, would suggest [facility] at the time of DC.   Enrolled patient in [place] program:  No  WOC nurse team will follow along with you for support with ostomy care and teaching.    Domenic Moras RN BSN [place] Pager [phone]

## 2017-08-13 ENCOUNTER — Encounter (HOSPITAL_COMMUNITY): Payer: Self-pay | Admitting: Urology

## 2017-08-13 LAB — BASIC METABOLIC PANEL
ANION GAP: 5 (ref 5–15)
BUN: 7 mg/dL (ref 6–20)
CALCIUM: 7.8 mg/dL — AB (ref 8.9–10.3)
CO2: 25 mmol/L (ref 22–32)
Chloride: 109 mmol/L (ref 101–111)
Creatinine, Ser: 0.77 mg/dL (ref 0.61–1.24)
GLUCOSE: 116 mg/dL — AB (ref 65–99)
POTASSIUM: 3.7 mmol/L (ref 3.5–5.1)
Sodium: 139 mmol/L (ref 135–145)

## 2017-08-13 LAB — CBC
HEMATOCRIT: 28 % — AB (ref 39.0–52.0)
HEMOGLOBIN: 9.9 g/dL — AB (ref 13.0–17.0)
MCH: 29.7 pg (ref 26.0–34.0)
MCHC: 35.4 g/dL (ref 30.0–36.0)
MCV: 84.1 fL (ref 78.0–100.0)
Platelets: 120 10*3/uL — ABNORMAL LOW (ref 150–400)
RBC: 3.33 MIL/uL — AB (ref 4.22–5.81)
RDW: 13.3 % (ref 11.5–15.5)
WBC: 9.8 10*3/uL (ref 4.0–10.5)

## 2017-08-13 MED ORDER — ENSURE ENLIVE PO LIQD
237.0000 mL | Freq: Two times a day (BID) | ORAL | Status: DC
  Administered 2017-08-13 (×2): 237 mL via ORAL

## 2017-08-13 MED ORDER — ZOLPIDEM TARTRATE 5 MG PO TABS
5.0000 mg | ORAL_TABLET | Freq: Every evening | ORAL | Status: DC | PRN
  Administered 2017-08-13: 5 mg via ORAL
  Filled 2017-08-13: qty 1

## 2017-08-13 NOTE — Progress Notes (Signed)
PT Cancellation Note  Patient Details Name: Kristopher Thompson, DDS MRN: [id number] DOB: 23-Aug-1951   Cancelled Treatment:    Reason Eval/Treat Not Completed: Medical issues which prohibited therapy. Per OT note, PT wll check back tomorrow.Marcelino Freestone PT [phone]  08/13/2017, 3:42 PM

## 2017-08-13 NOTE — Progress Notes (Signed)
OT Cancellation Note  Patient Details Name: Kristopher Thompson, DDS MRN: [id number] DOB: [birth year]   Cancelled Treatment:    Reason Eval/Treat Not Completed: Fatigue/lethargy limiting ability to participate.  Pt reports today hasn't been a good day, and does not wish to work with OT at this time.  Pt and wife inquiring about a sock aid.  Instructed them they can purchase in the gift shop.  [place], OTR/L [phone]   Lucille Passy M 08/13/2017, 2:16 PM

## 2017-08-13 NOTE — Progress Notes (Signed)
5 Days Post-Op distal rectal laceration repair with diverting sigmoid loop colostomy Subjective: Patient states pain is controlled.  No nausea.  Tolerating full liquids.  Air and stool in bag  Objective: Vital signs in last 24 hours: Temp:  [98.2 F (36.8 C)-99.3 F (37.4 C)] 98.2 F (36.8 C) (10/02 0502) Pulse Rate:  [78-89] 78 (10/02 0502) Resp:  [15-19] 19 (10/02 0502) BP: (124-127)/(57-71) 127/71 (10/02 0502) SpO2:  [98 %-100 %] 98 % (10/02 0502)   Intake/Output from previous day: 10/01 0701 - 10/02 0700 In: 2700 [P.O.:1200; I.V.:1500] Out: 3002 [Urine:2975; Drains:27] Intake/Output this shift: No intake/output data recorded.  General appearance: alert and cooperative GI: normal findings: soft, mildly distended Colostomy: beefy red, slightly retracted Incision: clear blood tinged fluid draining from incision  Lab Results:   Recent Labs  08/12/17 0534 08/13/17 0520  WBC 10.2 9.8  HGB 10.0* 9.9*  HCT 28.8* 28.0*  PLT 188 120*   BMET  Recent Labs  08/12/17 0534 08/13/17 0520  NA 141 139  K 3.5 3.7  CL 108 109  CO2 27 25  GLUCOSE 115* 116*  BUN 11 7  CREATININE 0.80 0.77  CALCIUM 7.9* 7.8*   PT/INR No results for input(s): LABPROT, INR in the last 72 hours. ABG No results for input(s): PHART, HCO3 in the last 72 hours.  Invalid input(s): PCO2, PO2  MEDS, Scheduled . acetaminophen  1,000 mg Oral TID  . alvimopan  12 mg Oral BID  . enoxaparin (LOVENOX) injection  40 mg Subcutaneous Q24H  . feeding supplement (ENSURE ENLIVE)  237 mL Oral BID BM  . lip balm  1 application Topical BID  . pantoprazole  40 mg Oral Q1200  . saccharomyces boulardii  250 mg Oral BID    Studies/Results: No results found.  Assessment: s/p Procedure(s): XI ROBOTIC ASSISTED LAPAROSCOPIC RADICAL CYSTECTOMY COVERTED TO OPEN PELVIC LYMPHADNECTOMY BILATERAL AND ILEAL CONDUIT URINARY DIVERSION REPAIR OF RECTAL TEAR POSSIBLE PARTIAL PROCTECTOMY, CREATION OF  OSTOMY Patient  Active Problem List   Diagnosis Date Noted  . GERD (gastroesophageal reflux disease) 08/11/2017  . Obesity (BMI 30-39.9) 08/11/2017  . 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 08/08/2017 08/08/2017    Expected post op course  Plan: Advance diet to reg today Cont Ostomy RN teaching Monitor JP output Anticipate colostomy reversal once rectum has healed Ok for d/c as early as later today or tomorrow from my standpoint JP and ostomy bar to stay in til next week   LOS: 5 days     .Rosario Adie, [place] [facility], [place]   08/13/2017 7:25 AM

## 2017-08-13 NOTE — Progress Notes (Addendum)
Patient ID: Kristopher Thompson, DDS, male   DOB: 1950/11/16, 66 y.o.   MRN: [id number]  5 Days Post-Op Subjective: Doing well.  Continues to make progress. Afebrile with normal vitals.  Ambulating better.  Tolerating full liquids well.  No nausea.  Flatus and small amount of stool in colostomy bag. JP at ureteroenteric anastomosis negative for Cr, will remove today. Will defer removal of other drain to general surgery  Objective: Vital signs in last 24 hours: Temp:  [98.2 F (36.8 C)-99.3 F (37.4 C)] 98.2 F (36.8 C) (10/02 0502) Pulse Rate:  [78-89] 78 (10/02 0502) Resp:  [15-19] 19 (10/02 0502) BP: (124-127)/(57-71) 127/71 (10/02 0502) SpO2:  [98 %-100 %] 98 % (10/02 0502)  Intake/Output from previous day: 10/01 0701 - 10/02 0700 In: 2700 [P.O.:1200; I.V.:1500] Out: 3002 [Urine:2975; Drains:27] Intake/Output this shift: No intake/output data recorded.  Physical Exam:  General: Alert and oriented CV: RRR Lungs: Clear Abdomen: Soft, ND, Good bowel sounds, Liquid brown stool and gas in colostomy bag. JP drains SS.  Incisions: Dry and Intact GU: Urine clear in urostomy bag, stents in place, urostomy pink Ext: NT, No erythema  Lab Results:  Recent Labs  08/11/17 0640 08/12/17 0534 08/13/17 0520  HGB 10.3* 10.0* 9.9*  HCT 30.1* 28.8* 28.0*   CBC Latest Ref Rng & Units 08/13/2017 08/12/2017 08/11/2017  WBC 4.0 - 10.5 K/uL 9.8 10.2 12.8(H)  Hemoglobin 13.0 - 17.0 g/dL 9.9(L) 10.0(L) 10.3(L)  Hematocrit 39.0 - 52.0 % 28.0(L) 28.8(L) 30.1(L)  Platelets 150 - 400 K/uL 120(L) 188 143(L)     BMET  Recent Labs  08/12/17 0534 08/13/17 0520  NA 141 139  K 3.5 3.7  CL 108 109  CO2 27 25  GLUCOSE 115* 116*  BUN 11 7  CREATININE 0.80 0.77  CALCIUM 7.9* 7.8*     Studies/Results: No results found.  Assessment/Plan: 1 - Bladder Cancer - s/p open cystectomy  + loop colostomy 08/08/17 in combined Urol [name]) and Gen surg Marcello Moores) surgery. Observed stepdown initially.  Transferred to med surg floor 9/28. Pathology pending.  2 - Loop Colostomy - s/p rectal repair and loop colostomy at time of surgery 9/27. NPO initially. Ice chips added 9/29. Received entereg peri-op. Advanced to fulls 10/1. Flatus and stool in colostomy bag on 9/30.  Will advance to regular diet.   3 - Low Grade Fever - Fevers Tm 100.5 or less POD 2. Had bowel + bladder surgery. Empiric Zosyn 9/29 - 10/1, stopped after WBC normalized and no fevers for 48 hrs. No localizing symptoms / wound problems.   4 - Disposition / Rehab - Pt independent at baseline. PT eval ordered 9/29 to help with DC planning / DM. Ostomy RN also following.  Will need home health care for ostomy care, Lovenox instruction as he will need this as outpatient for 30 days, and possible HH PT pending how he progresses the next couple of days.  [place] d/c tomorrow if continues to progress well.    LOS: 5 days   Stasia Cavalier 08/13/2017, 7:11 AM

## 2017-08-13 NOTE — Care Management Note (Signed)
Case Management Note  Patient Details  Name: MERVILLE HIJAZI, DDS MRN: [id number] Date of Birth: January 23, 1951  Subjective/Objective: CM referral for HHC-HHRN-instruction(lovenox/ostomy)HHPT/HHOT, await HHC & f39f orders. Provided patient/spouse w/HHC agency list-chose [facility]-rep Cheryl aware & following.                   Action/Plan:d/c plan home w/HHC.   Expected Discharge Date:                  Expected Discharge Plan:  [place]  In-House Referral:     Discharge planning Services  CM Consult  Post Acute Care Choice:    Choice offered to:  Patient, Spouse  DME Arranged:    DME Agency:     HH Arranged:  PT, OT HH Agency:  [place]  Status of Service:  In process, will continue to follow  If discussed at Long Length of Stay Meetings, dates discussed:    Additional Comments:  Dessa Phi, RN 08/13/2017, 12:50 PM

## 2017-08-14 LAB — CBC
HEMATOCRIT: 27.2 % — AB (ref 39.0–52.0)
Hemoglobin: 9.3 g/dL — ABNORMAL LOW (ref 13.0–17.0)
MCH: 28.8 pg (ref 26.0–34.0)
MCHC: 34.2 g/dL (ref 30.0–36.0)
MCV: 84.2 fL (ref 78.0–100.0)
Platelets: 215 10*3/uL (ref 150–400)
RBC: 3.23 MIL/uL — ABNORMAL LOW (ref 4.22–5.81)
RDW: 13.5 % (ref 11.5–15.5)
WBC: 10.3 10*3/uL (ref 4.0–10.5)

## 2017-08-14 LAB — BASIC METABOLIC PANEL
Anion gap: 5 (ref 5–15)
BUN: 10 mg/dL (ref 6–20)
CHLORIDE: 111 mmol/L (ref 101–111)
CO2: 26 mmol/L (ref 22–32)
Calcium: 8.1 mg/dL — ABNORMAL LOW (ref 8.9–10.3)
Creatinine, Ser: 0.75 mg/dL (ref 0.61–1.24)
GFR calc non Af Amer: >60 mL/min (ref >60)
Glucose, Bld: 114 mg/dL — ABNORMAL HIGH (ref 65–99)
Potassium: 3.6 mmol/L (ref 3.5–5.1)
SODIUM: 142 mmol/L (ref 135–145)

## 2017-08-14 MED ORDER — ENOXAPARIN SODIUM 40 MG/0.4ML ~~LOC~~ SOLN: 40.0000 mg | SUBCUTANEOUS | 0 refills | Status: DC

## 2017-08-14 MED ORDER — SENNA 8.6 MG PO TABS: 1.0000 | ORAL_TABLET | Freq: Every day | ORAL | 0 refills | Status: AC

## 2017-08-14 MED ORDER — HYDROCODONE-ACETAMINOPHEN 5-325 MG PO TABS: 1.0000 | ORAL_TABLET | Freq: Four times a day (QID) | ORAL | 0 refills | Status: DC | PRN

## 2017-08-14 MED ORDER — SULFAMETHOXAZOLE-TRIMETHOPRIM 400-80 MG PO TABS: 1.0000 | ORAL_TABLET | Freq: Every day | ORAL | 0 refills | Status: DC

## 2017-08-14 MED ORDER — DOCUSATE SODIUM 100 MG PO CAPS: 200.0000 mg | ORAL_CAPSULE | Freq: Two times a day (BID) | ORAL | 11 refills | Status: DC

## 2017-08-14 NOTE — Discharge Summary (Signed)
[facility] Discharge Summary  Admit date: 08/08/2017  Discharge date and time: 08/14/17   Discharge to: Home with home health  Discharge Service: Urology  Discharge Attending Physician:  Dr. 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  Vital signs and laboratory values were stable and within normal limits.  The physical exam was benign and unchanged and all surgical wounds were examined.  Discharge instructions were explained and all questions answered.  Subjective  No acute events overnight. Pain Controlled. No fever or chills.  Objective Patient Vitals for the past 8 hrs:  BP Temp Temp src Pulse Resp SpO2  08/14/17 0501 135/69 98.6 F (37 C) Oral 78 18 99 %   No intake/output data recorded.  General Appearance:        No acute distress Lungs:                       Normal work of breathing on room air Heart:                                Regular rate and rhythm Abdomen:                         Soft, non-tender, non-distended. Urostomy slightly retracted but pink with clear yellow urine draining. Stents visible exiting urostomy. LLQ colostomy with stool and gas in the bag. Incision c/d/i with staples. RLW JP drain with minimal SS contents.  Extremities:                      Warm and well  perfused   Hospital Course:  The patient underwent a radical cystectomy with ileal conduit urinary diversion on 08/08/2017. This was complicated by a rectal injury which was repaired by general surgery, and a loop colostomy was placed for diversion. Overall, the patient tolerated the procedure well, was extubated in the OR, and afterwards was taken to the PACU for routine post-surgical care. When stable the patient was transferred to stepdown, where he did well. He was transferred to the floor on POD1.  The patient did well postoperatively.  The patient's diet was slowly advanced and at the time of discharge was tolerating a regular diet with good colostomy output.  The patient was discharged home 6 Days Post-Op, at which point was tolerating a regular solid diet, have adequate pain control with P.O. pain medication, and could ambulate without difficulty. He and his wife feel adept at changing his urostomy and colostomy appliances. He will be discharged with home health RN and PT/OT. That patient will be discharged with the JP drain placed by general surgery, which will be removed next week at his follow up appt. The JP drain at the ureteroenteric anastomosis was checked for Cr and  removed after a negative study.The patient will follow up with [place] for post op check, staple removal, and stent removal in the near future.  Condition at Discharge: Improved  Discharge Medications:  Allergies as of 08/14/2017      Reactions   Demerol [meperidine] Other (See Comments)   SEVERE NAUSEA      Medication List    TAKE these medications   acetaminophen 500 MG tablet Commonly known as:  TYLENOL Take 1,000 mg by mouth daily as needed for mild pain. For pain   docusate sodium 100 MG capsule Commonly known as:  COLACE Take 2 capsules (200 mg total) by mouth 2 (two) times daily.   enoxaparin 40 MG/0.4ML injection Commonly known as:  LOVENOX Inject 0.4 mLs (40 mg total) into the skin daily.   esomeprazole 20  MG capsule Commonly known as:  NEXIUM Take 20 mg by mouth daily as needed (heartburn).   HYDROcodone-acetaminophen 5-325 MG tablet Commonly known as:  NORCO/VICODIN Take 1-2 tablets by mouth every 6 (six) hours as needed for moderate pain (pain).   senna 8.6 MG Tabs tablet Commonly known as:  SENOKOT Take 1-2 tablets (8.6-17.2 mg total) by mouth at bedtime.   sulfamethoxazole-trimethoprim 400-80 MG tablet Commonly known as:  BACTRIM Take 1 tablet by mouth daily.

## 2017-08-14 NOTE — Progress Notes (Signed)
Patient ID: Kristopher Thompson, DDS, male   DOB: 1951-04-22, 66 y.o.   MRN: [id number]  6 Days Post-Op Subjective: Pt doing well.  Tolerating regular diet.  Ambulating.  Pain controlled.  Objective: Vital signs in last 24 hours: Temp:  [98.6 F (37 C)-99.1 F (37.3 C)] 98.6 F (37 C) (10/03 0501) Pulse Rate:  [78-83] 78 (10/03 0501) Resp:  [17-18] 18 (10/03 0501) BP: (131-135)/(66-71) 135/69 (10/03 0501) SpO2:  [98 %-100 %] 99 % (10/03 0501)  Intake/Output from previous day: 10/02 0701 - 10/03 0700 In: 862.5 [P.O.:840] Out: 2905 [Urine:2800; Drains:5; Stool:100] Intake/Output this shift: No intake/output data recorded.  Physical Exam:  General: Alert and oriented CV: RRR Lungs: Clear Abdomen: Soft, ND, Positive BS, Ostomies pink, urine clear. Incisions: C/D/I Ext: NT, No erythema  Lab Results:  Recent Labs  08/12/17 0534 08/13/17 0520 08/14/17 0510  HGB 10.0* 9.9* 9.3*  HCT 28.8* 28.0* 27.2*   BMET  Recent Labs  08/13/17 0520 08/14/17 0510  NA 139 142  K 3.7 3.6  CL 109 111  CO2 25 26  GLUCOSE 116* 114*  BUN 7 10  CREATININE 0.77 0.75  CALCIUM 7.8* 8.1*     Studies/Results: No results found.  Assessment/Plan: S/P Radical cystectomy/ileal conduit/colostomy - D/C home today with home health, Lovenox - Will arrange f/u with me and Dr. Marcello Moores next Tuesday.   LOS: 6 days   Colburn Asper,LES 08/14/2017, 8:26 AM

## 2017-08-14 NOTE — Progress Notes (Signed)
Physical Therapy Treatment Patient Details Name: Kristopher Thompson, DDS MRN: [id number] DOB: [birth year] Today's Date: 08/14/2017    History of Present Illness 66 y/o male s/p radical cystectomy and ileal conduit with rectal repair and colostomy (08/08/17) due to bladder cancer.    PT Comments    The patient reports that he walked x 4 yesterday. Ambulated with cane, today with RW. Declined HHPT.    Follow Up Recommendations        Equipment Recommendations       Recommendations for Other Services       Precautions / Restrictions Precautions Precaution Comments: abd surgery, 2 JP drains, urostomy, colostomy    Mobility  Bed Mobility                  Transfers                    Ambulation/Gait                 Stairs            Wheelchair Mobility    Modified Rankin (Stroke Patients Only)       Balance                                            Cognition                                              Exercises      General Comments        Pertinent Vitals/Pain Pain Assessment: Faces Pain Score: 3  Pain Location: lower abdomen Pain Descriptors / Indicators: Sore    Home Living                      Prior Function            PT Goals (current goals can now be found in the care plan section)      Frequency           PT Plan      Co-evaluation              AM-PAC PT "6 Clicks" Daily Activity  Outcome Measure                   End of Session               Time: 7893-8101 PT Time Calculation (min) (ACUTE ONLY): 10 min  Charges:  $Gait Training: 8-22 mins                    G Codes:       {   Claretha Cooper 08/14/2017, 1:13 PM

## 2017-08-14 NOTE — Progress Notes (Signed)
6 Days Post-Op distal rectal laceration repair with diverting sigmoid loop colostomy Subjective: Patient states pain is controlled.  No nausea.  Tolerating his diet.  Air and stool in bag  Objective: Vital signs in last 24 hours: Temp:  [98.6 F (37 C)-99.1 F (37.3 C)] 98.6 F (37 C) (10/03 0501) Pulse Rate:  [78-83] 78 (10/03 0501) Resp:  [17-18] 18 (10/03 0501) BP: (131-135)/(66-71) 135/69 (10/03 0501) SpO2:  [98 %-100 %] 99 % (10/03 0501)   Intake/Output from previous day: 10/02 0701 - 10/03 0700 In: 862.5 [P.O.:840] Out: 2905 [Urine:2800; Drains:5; Stool:100] Intake/Output this shift: No intake/output data recorded.  General appearance: alert and cooperative GI: normal findings: soft, mildly distended Colostomy: beefy red, slightly retracted Incision: clear blood tinged fluid draining from incision  Lab Results:   Recent Labs  08/13/17 0520 08/14/17 0510  WBC 9.8 10.3  HGB 9.9* 9.3*  HCT 28.0* 27.2*  PLT 120* 215   BMET  Recent Labs  08/13/17 0520 08/14/17 0510  NA 139 142  K 3.7 3.6  CL 109 111  CO2 25 26  GLUCOSE 116* 114*  BUN 7 10  CREATININE 0.77 0.75  CALCIUM 7.8* 8.1*   PT/INR No results for input(s): LABPROT, INR in the last 72 hours. ABG No results for input(s): PHART, HCO3 in the last 72 hours.  Invalid input(s): PCO2, PO2  MEDS, Scheduled . acetaminophen  1,000 mg Oral TID  . alvimopan  12 mg Oral BID  . enoxaparin (LOVENOX) injection  40 mg Subcutaneous Q24H  . feeding supplement (ENSURE ENLIVE)  237 mL Oral BID BM  . lip balm  1 application Topical BID  . pantoprazole  40 mg Oral Q1200  . saccharomyces boulardii  250 mg Oral BID    Studies/Results: No results found.  Assessment: s/p Procedure(s): XI ROBOTIC ASSISTED LAPAROSCOPIC RADICAL CYSTECTOMY COVERTED TO OPEN PELVIC LYMPHADNECTOMY BILATERAL AND ILEAL CONDUIT URINARY DIVERSION REPAIR OF RECTAL TEAR POSSIBLE PARTIAL PROCTECTOMY, CREATION OF  OSTOMY Patient Active  Problem List   Diagnosis Date Noted  . GERD (gastroesophageal reflux disease) 08/11/2017  . Obesity (BMI 30-39.9) 08/11/2017  . 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 08/08/2017 08/08/2017    Expected post op course  Plan:  Ok for d/c from my standpoint JP and ostomy bar to stay in til next week Pt has an apt to see me next Tues AM   LOS: 6 days     .Rosario Adie, [facility], [place]   08/14/2017 8:25 AM

## 2017-08-15 DIAGNOSIS — Z436 Encounter for attention to other artificial openings of urinary tract: Secondary | ICD-10-CM | POA: Diagnosis not present

## 2017-08-15 DIAGNOSIS — Z906 Acquired absence of other parts of urinary tract: Secondary | ICD-10-CM | POA: Diagnosis not present

## 2017-08-15 DIAGNOSIS — Z6836 Body mass index (BMI) 36.0-36.9, adult: Secondary | ICD-10-CM | POA: Diagnosis not present

## 2017-08-15 DIAGNOSIS — Z483 Aftercare following surgery for neoplasm: Secondary | ICD-10-CM | POA: Diagnosis not present

## 2017-08-15 DIAGNOSIS — Z87891 Personal history of nicotine dependence: Secondary | ICD-10-CM | POA: Diagnosis not present

## 2017-08-15 DIAGNOSIS — E669 Obesity, unspecified: Secondary | ICD-10-CM | POA: Diagnosis not present

## 2017-08-15 DIAGNOSIS — C678 Malignant neoplasm of overlapping sites of bladder: Secondary | ICD-10-CM | POA: Diagnosis not present

## 2017-08-15 DIAGNOSIS — Z433 Encounter for attention to colostomy: Secondary | ICD-10-CM | POA: Diagnosis not present

## 2017-08-15 DIAGNOSIS — Z8546 Personal history of malignant neoplasm of prostate: Secondary | ICD-10-CM | POA: Diagnosis not present

## 2017-08-15 DIAGNOSIS — Z48815 Encounter for surgical aftercare following surgery on the digestive system: Secondary | ICD-10-CM | POA: Diagnosis not present

## 2017-08-15 DIAGNOSIS — K219 Gastro-esophageal reflux disease without esophagitis: Secondary | ICD-10-CM | POA: Diagnosis not present

## 2017-08-15 DIAGNOSIS — Z9079 Acquired absence of other genital organ(s): Secondary | ICD-10-CM | POA: Diagnosis not present

## 2017-08-17 DIAGNOSIS — Z436 Encounter for attention to other artificial openings of urinary tract: Secondary | ICD-10-CM | POA: Diagnosis not present

## 2017-08-17 DIAGNOSIS — Z48815 Encounter for surgical aftercare following surgery on the digestive system: Secondary | ICD-10-CM | POA: Diagnosis not present

## 2017-08-17 DIAGNOSIS — Z433 Encounter for attention to colostomy: Secondary | ICD-10-CM | POA: Diagnosis not present

## 2017-08-17 DIAGNOSIS — Z483 Aftercare following surgery for neoplasm: Secondary | ICD-10-CM | POA: Diagnosis not present

## 2017-08-17 DIAGNOSIS — C678 Malignant neoplasm of overlapping sites of bladder: Secondary | ICD-10-CM | POA: Diagnosis not present

## 2017-08-17 DIAGNOSIS — K219 Gastro-esophageal reflux disease without esophagitis: Secondary | ICD-10-CM | POA: Diagnosis not present

## 2017-08-19 DIAGNOSIS — K219 Gastro-esophageal reflux disease without esophagitis: Secondary | ICD-10-CM | POA: Diagnosis not present

## 2017-08-19 DIAGNOSIS — Z433 Encounter for attention to colostomy: Secondary | ICD-10-CM | POA: Diagnosis not present

## 2017-08-19 DIAGNOSIS — C678 Malignant neoplasm of overlapping sites of bladder: Secondary | ICD-10-CM | POA: Diagnosis not present

## 2017-08-19 DIAGNOSIS — Z48815 Encounter for surgical aftercare following surgery on the digestive system: Secondary | ICD-10-CM | POA: Diagnosis not present

## 2017-08-19 DIAGNOSIS — Z436 Encounter for attention to other artificial openings of urinary tract: Secondary | ICD-10-CM | POA: Diagnosis not present

## 2017-08-19 DIAGNOSIS — Z483 Aftercare following surgery for neoplasm: Secondary | ICD-10-CM | POA: Diagnosis not present

## 2017-08-20 DIAGNOSIS — C678 Malignant neoplasm of overlapping sites of bladder: Secondary | ICD-10-CM | POA: Diagnosis not present

## 2017-08-21 DIAGNOSIS — Z433 Encounter for attention to colostomy: Secondary | ICD-10-CM | POA: Diagnosis not present

## 2017-08-21 DIAGNOSIS — Z436 Encounter for attention to other artificial openings of urinary tract: Secondary | ICD-10-CM | POA: Diagnosis not present

## 2017-08-21 DIAGNOSIS — Z483 Aftercare following surgery for neoplasm: Secondary | ICD-10-CM | POA: Diagnosis not present

## 2017-08-21 DIAGNOSIS — Z48815 Encounter for surgical aftercare following surgery on the digestive system: Secondary | ICD-10-CM | POA: Diagnosis not present

## 2017-08-21 DIAGNOSIS — C678 Malignant neoplasm of overlapping sites of bladder: Secondary | ICD-10-CM | POA: Diagnosis not present

## 2017-08-21 DIAGNOSIS — K219 Gastro-esophageal reflux disease without esophagitis: Secondary | ICD-10-CM | POA: Diagnosis not present

## 2017-08-23 DIAGNOSIS — C678 Malignant neoplasm of overlapping sites of bladder: Secondary | ICD-10-CM | POA: Diagnosis not present

## 2017-08-23 DIAGNOSIS — Z433 Encounter for attention to colostomy: Secondary | ICD-10-CM | POA: Diagnosis not present

## 2017-08-23 DIAGNOSIS — K219 Gastro-esophageal reflux disease without esophagitis: Secondary | ICD-10-CM | POA: Diagnosis not present

## 2017-08-23 DIAGNOSIS — Z483 Aftercare following surgery for neoplasm: Secondary | ICD-10-CM | POA: Diagnosis not present

## 2017-08-23 DIAGNOSIS — Z436 Encounter for attention to other artificial openings of urinary tract: Secondary | ICD-10-CM | POA: Diagnosis not present

## 2017-08-23 DIAGNOSIS — Z48815 Encounter for surgical aftercare following surgery on the digestive system: Secondary | ICD-10-CM | POA: Diagnosis not present

## 2017-08-27 DIAGNOSIS — Z436 Encounter for attention to other artificial openings of urinary tract: Secondary | ICD-10-CM | POA: Diagnosis not present

## 2017-08-27 DIAGNOSIS — Z433 Encounter for attention to colostomy: Secondary | ICD-10-CM | POA: Diagnosis not present

## 2017-08-27 DIAGNOSIS — Z48815 Encounter for surgical aftercare following surgery on the digestive system: Secondary | ICD-10-CM | POA: Diagnosis not present

## 2017-08-27 DIAGNOSIS — C678 Malignant neoplasm of overlapping sites of bladder: Secondary | ICD-10-CM | POA: Diagnosis not present

## 2017-08-27 DIAGNOSIS — Z483 Aftercare following surgery for neoplasm: Secondary | ICD-10-CM | POA: Diagnosis not present

## 2017-08-27 DIAGNOSIS — K219 Gastro-esophageal reflux disease without esophagitis: Secondary | ICD-10-CM | POA: Diagnosis not present

## 2017-08-29 DIAGNOSIS — Z483 Aftercare following surgery for neoplasm: Secondary | ICD-10-CM | POA: Diagnosis not present

## 2017-08-29 DIAGNOSIS — Z433 Encounter for attention to colostomy: Secondary | ICD-10-CM | POA: Diagnosis not present

## 2017-08-29 DIAGNOSIS — C678 Malignant neoplasm of overlapping sites of bladder: Secondary | ICD-10-CM | POA: Diagnosis not present

## 2017-08-29 DIAGNOSIS — C675 Malignant neoplasm of bladder neck: Secondary | ICD-10-CM | POA: Diagnosis not present

## 2017-08-29 DIAGNOSIS — Z436 Encounter for attention to other artificial openings of urinary tract: Secondary | ICD-10-CM | POA: Diagnosis not present

## 2017-08-29 DIAGNOSIS — Z48815 Encounter for surgical aftercare following surgery on the digestive system: Secondary | ICD-10-CM | POA: Diagnosis not present

## 2017-08-29 DIAGNOSIS — K219 Gastro-esophageal reflux disease without esophagitis: Secondary | ICD-10-CM | POA: Diagnosis not present

## 2017-08-30 DIAGNOSIS — Z48815 Encounter for surgical aftercare following surgery on the digestive system: Secondary | ICD-10-CM | POA: Diagnosis not present

## 2017-08-30 DIAGNOSIS — C678 Malignant neoplasm of overlapping sites of bladder: Secondary | ICD-10-CM | POA: Diagnosis not present

## 2017-08-30 DIAGNOSIS — Z433 Encounter for attention to colostomy: Secondary | ICD-10-CM | POA: Diagnosis not present

## 2017-08-30 DIAGNOSIS — K219 Gastro-esophageal reflux disease without esophagitis: Secondary | ICD-10-CM | POA: Diagnosis not present

## 2017-08-30 DIAGNOSIS — Z483 Aftercare following surgery for neoplasm: Secondary | ICD-10-CM | POA: Diagnosis not present

## 2017-08-30 DIAGNOSIS — Z436 Encounter for attention to other artificial openings of urinary tract: Secondary | ICD-10-CM | POA: Diagnosis not present

## 2017-09-02 DIAGNOSIS — Z483 Aftercare following surgery for neoplasm: Secondary | ICD-10-CM | POA: Diagnosis not present

## 2017-09-02 DIAGNOSIS — Z48815 Encounter for surgical aftercare following surgery on the digestive system: Secondary | ICD-10-CM | POA: Diagnosis not present

## 2017-09-02 DIAGNOSIS — C678 Malignant neoplasm of overlapping sites of bladder: Secondary | ICD-10-CM | POA: Diagnosis not present

## 2017-09-02 DIAGNOSIS — Z433 Encounter for attention to colostomy: Secondary | ICD-10-CM | POA: Diagnosis not present

## 2017-09-02 DIAGNOSIS — K219 Gastro-esophageal reflux disease without esophagitis: Secondary | ICD-10-CM | POA: Diagnosis not present

## 2017-09-02 DIAGNOSIS — Z436 Encounter for attention to other artificial openings of urinary tract: Secondary | ICD-10-CM | POA: Diagnosis not present

## 2017-09-04 DIAGNOSIS — Z436 Encounter for attention to other artificial openings of urinary tract: Secondary | ICD-10-CM | POA: Diagnosis not present

## 2017-09-04 DIAGNOSIS — Z433 Encounter for attention to colostomy: Secondary | ICD-10-CM | POA: Diagnosis not present

## 2017-09-04 DIAGNOSIS — Z48815 Encounter for surgical aftercare following surgery on the digestive system: Secondary | ICD-10-CM | POA: Diagnosis not present

## 2017-09-04 DIAGNOSIS — Z483 Aftercare following surgery for neoplasm: Secondary | ICD-10-CM | POA: Diagnosis not present

## 2017-09-04 DIAGNOSIS — K219 Gastro-esophageal reflux disease without esophagitis: Secondary | ICD-10-CM | POA: Diagnosis not present

## 2017-09-04 DIAGNOSIS — C678 Malignant neoplasm of overlapping sites of bladder: Secondary | ICD-10-CM | POA: Diagnosis not present

## 2017-09-06 DIAGNOSIS — K219 Gastro-esophageal reflux disease without esophagitis: Secondary | ICD-10-CM | POA: Diagnosis not present

## 2017-09-06 DIAGNOSIS — Z433 Encounter for attention to colostomy: Secondary | ICD-10-CM | POA: Diagnosis not present

## 2017-09-06 DIAGNOSIS — C678 Malignant neoplasm of overlapping sites of bladder: Secondary | ICD-10-CM | POA: Diagnosis not present

## 2017-09-06 DIAGNOSIS — Z436 Encounter for attention to other artificial openings of urinary tract: Secondary | ICD-10-CM | POA: Diagnosis not present

## 2017-09-06 DIAGNOSIS — Z483 Aftercare following surgery for neoplasm: Secondary | ICD-10-CM | POA: Diagnosis not present

## 2017-09-06 DIAGNOSIS — Z48815 Encounter for surgical aftercare following surgery on the digestive system: Secondary | ICD-10-CM | POA: Diagnosis not present

## 2017-09-10 DIAGNOSIS — Z436 Encounter for attention to other artificial openings of urinary tract: Secondary | ICD-10-CM | POA: Diagnosis not present

## 2017-09-10 DIAGNOSIS — K219 Gastro-esophageal reflux disease without esophagitis: Secondary | ICD-10-CM | POA: Diagnosis not present

## 2017-09-10 DIAGNOSIS — Z433 Encounter for attention to colostomy: Secondary | ICD-10-CM | POA: Diagnosis not present

## 2017-09-10 DIAGNOSIS — Z483 Aftercare following surgery for neoplasm: Secondary | ICD-10-CM | POA: Diagnosis not present

## 2017-09-10 DIAGNOSIS — C678 Malignant neoplasm of overlapping sites of bladder: Secondary | ICD-10-CM | POA: Diagnosis not present

## 2017-09-10 DIAGNOSIS — Z48815 Encounter for surgical aftercare following surgery on the digestive system: Secondary | ICD-10-CM | POA: Diagnosis not present

## 2017-09-11 DIAGNOSIS — K219 Gastro-esophageal reflux disease without esophagitis: Secondary | ICD-10-CM | POA: Diagnosis not present

## 2017-09-11 DIAGNOSIS — Z48815 Encounter for surgical aftercare following surgery on the digestive system: Secondary | ICD-10-CM | POA: Diagnosis not present

## 2017-09-11 DIAGNOSIS — Z436 Encounter for attention to other artificial openings of urinary tract: Secondary | ICD-10-CM | POA: Diagnosis not present

## 2017-09-11 DIAGNOSIS — Z483 Aftercare following surgery for neoplasm: Secondary | ICD-10-CM | POA: Diagnosis not present

## 2017-09-11 DIAGNOSIS — Z433 Encounter for attention to colostomy: Secondary | ICD-10-CM | POA: Diagnosis not present

## 2017-09-11 DIAGNOSIS — C678 Malignant neoplasm of overlapping sites of bladder: Secondary | ICD-10-CM | POA: Diagnosis not present

## 2017-09-12 DIAGNOSIS — Z8619 Personal history of other infectious and parasitic diseases: Secondary | ICD-10-CM

## 2017-09-12 HISTORY — DX: Personal history of other infectious and parasitic diseases: Z86.19

## 2017-09-18 DIAGNOSIS — Z48815 Encounter for surgical aftercare following surgery on the digestive system: Secondary | ICD-10-CM | POA: Diagnosis not present

## 2017-09-18 DIAGNOSIS — Z433 Encounter for attention to colostomy: Secondary | ICD-10-CM | POA: Diagnosis not present

## 2017-09-18 DIAGNOSIS — Z483 Aftercare following surgery for neoplasm: Secondary | ICD-10-CM | POA: Diagnosis not present

## 2017-09-18 DIAGNOSIS — Z436 Encounter for attention to other artificial openings of urinary tract: Secondary | ICD-10-CM | POA: Diagnosis not present

## 2017-09-18 DIAGNOSIS — K219 Gastro-esophageal reflux disease without esophagitis: Secondary | ICD-10-CM | POA: Diagnosis not present

## 2017-09-18 DIAGNOSIS — C678 Malignant neoplasm of overlapping sites of bladder: Secondary | ICD-10-CM | POA: Diagnosis not present

## 2017-09-26 DIAGNOSIS — C678 Malignant neoplasm of overlapping sites of bladder: Secondary | ICD-10-CM | POA: Diagnosis not present

## 2017-09-26 DIAGNOSIS — Z436 Encounter for attention to other artificial openings of urinary tract: Secondary | ICD-10-CM | POA: Diagnosis not present

## 2017-09-26 DIAGNOSIS — K219 Gastro-esophageal reflux disease without esophagitis: Secondary | ICD-10-CM | POA: Diagnosis not present

## 2017-09-26 DIAGNOSIS — Z433 Encounter for attention to colostomy: Secondary | ICD-10-CM | POA: Diagnosis not present

## 2017-09-26 DIAGNOSIS — Z483 Aftercare following surgery for neoplasm: Secondary | ICD-10-CM | POA: Diagnosis not present

## 2017-09-26 DIAGNOSIS — Z48815 Encounter for surgical aftercare following surgery on the digestive system: Secondary | ICD-10-CM | POA: Diagnosis not present

## 2017-09-30 DIAGNOSIS — Z433 Encounter for attention to colostomy: Secondary | ICD-10-CM | POA: Diagnosis not present

## 2017-09-30 DIAGNOSIS — Z48815 Encounter for surgical aftercare following surgery on the digestive system: Secondary | ICD-10-CM | POA: Diagnosis not present

## 2017-09-30 DIAGNOSIS — Z436 Encounter for attention to other artificial openings of urinary tract: Secondary | ICD-10-CM | POA: Diagnosis not present

## 2017-09-30 DIAGNOSIS — Z483 Aftercare following surgery for neoplasm: Secondary | ICD-10-CM | POA: Diagnosis not present

## 2017-09-30 DIAGNOSIS — C678 Malignant neoplasm of overlapping sites of bladder: Secondary | ICD-10-CM | POA: Diagnosis not present

## 2017-09-30 DIAGNOSIS — K219 Gastro-esophageal reflux disease without esophagitis: Secondary | ICD-10-CM | POA: Diagnosis not present

## 2017-10-01 DIAGNOSIS — C678 Malignant neoplasm of overlapping sites of bladder: Secondary | ICD-10-CM | POA: Diagnosis not present

## 2017-10-03 DIAGNOSIS — N134 Hydroureter: Secondary | ICD-10-CM | POA: Diagnosis not present

## 2017-10-03 DIAGNOSIS — N1330 Unspecified hydronephrosis: Secondary | ICD-10-CM | POA: Diagnosis not present

## 2017-10-03 DIAGNOSIS — Z9889 Other specified postprocedural states: Secondary | ICD-10-CM | POA: Diagnosis not present

## 2017-10-03 DIAGNOSIS — Z8546 Personal history of malignant neoplasm of prostate: Secondary | ICD-10-CM | POA: Diagnosis not present

## 2017-10-03 DIAGNOSIS — N179 Acute kidney failure, unspecified: Secondary | ICD-10-CM | POA: Diagnosis not present

## 2017-10-03 DIAGNOSIS — N1339 Other hydronephrosis: Secondary | ICD-10-CM | POA: Diagnosis not present

## 2017-10-03 DIAGNOSIS — A419 Sepsis, unspecified organism: Secondary | ICD-10-CM | POA: Diagnosis not present

## 2017-10-03 DIAGNOSIS — Z934 Other artificial openings of gastrointestinal tract status: Secondary | ICD-10-CM | POA: Diagnosis not present

## 2017-10-03 DIAGNOSIS — N390 Urinary tract infection, site not specified: Secondary | ICD-10-CM | POA: Diagnosis not present

## 2017-10-03 DIAGNOSIS — R112 Nausea with vomiting, unspecified: Secondary | ICD-10-CM | POA: Diagnosis not present

## 2017-10-03 DIAGNOSIS — R1084 Generalized abdominal pain: Secondary | ICD-10-CM | POA: Diagnosis not present

## 2017-10-03 DIAGNOSIS — K7689 Other specified diseases of liver: Secondary | ICD-10-CM | POA: Diagnosis not present

## 2017-10-03 DIAGNOSIS — R109 Unspecified abdominal pain: Secondary | ICD-10-CM | POA: Diagnosis not present

## 2017-10-03 DIAGNOSIS — Z8551 Personal history of malignant neoplasm of bladder: Secondary | ICD-10-CM | POA: Diagnosis not present

## 2017-10-03 DIAGNOSIS — Z933 Colostomy status: Secondary | ICD-10-CM | POA: Diagnosis not present

## 2017-10-03 DIAGNOSIS — A4189 Other specified sepsis: Secondary | ICD-10-CM | POA: Diagnosis not present

## 2017-10-03 DIAGNOSIS — R5381 Other malaise: Secondary | ICD-10-CM | POA: Diagnosis not present

## 2017-10-03 NOTE — Progress Notes (Signed)
This is a no charge note  Pending admission per Dr.   Nicki Guadalajara from [facility] per Dr. Oren Section.  66 year old woman with past medical history for prostate cancer, bladder cancer, s/p of diverting colostomy in place for rectal repair 08/08/2017 and S/P ileal conduit by Dr. [name], who presents with abdominal pain for 2 days.   Patient was found to have WBC 28, acute renal injury with creatinine 1.68, positive urinalysis, lactic acid 1.8, temperature 99.5, tachycardia, RR 20, oxygen saturation normal, Bp 140/72. CT abdomen/pelvis showed bilateral moderate hydronephrosis, which is likely due to inflammatory process per radiologist, and fluid accumulation 5 x 6 cm in the insertion site of ileal conduit, concerning for abscess. EDP consulted urologist, Dr. Junious Silk. Pt is accepted to telemetry bed as inpatient. Patient was started with Zosyn and vancomycin. Patient was given 3 L normal saline bolus. I asked EDP to continue IV fluid at 100 mL/h if patient does not have signs of CHF.  Please call manager of [facility] at [phone] when pt arrives to floor   Ivor Costa, MD  [facility] Pager [phone]  If 7PM-7AM, please contact night-coverage [email or web address] Password [email or web address] 10/03/2017, 11:01 PM

## 2017-10-04 ENCOUNTER — Other Ambulatory Visit (HOSPITAL_COMMUNITY): Payer: Self-pay | Admitting: Radiology

## 2017-10-04 ENCOUNTER — Inpatient Hospital Stay (HOSPITAL_COMMUNITY): Payer: Medicare Other

## 2017-10-04 ENCOUNTER — Inpatient Hospital Stay: Payer: Medicare Other

## 2017-10-04 ENCOUNTER — Other Ambulatory Visit: Payer: Self-pay

## 2017-10-04 ENCOUNTER — Inpatient Hospital Stay: Payer: Self-pay

## 2017-10-04 ENCOUNTER — Inpatient Hospital Stay (HOSPITAL_COMMUNITY)
Admission: AD | Admit: 2017-10-04 | Discharge: 2017-10-14 | DRG: 862 | Disposition: A | Discharge status: Home Health | Payer: Medicare Other | Source: Other Acute Inpatient Hospital | Attending: Internal Medicine | Admitting: Internal Medicine

## 2017-10-04 ENCOUNTER — Encounter (HOSPITAL_COMMUNITY): Payer: Self-pay

## 2017-10-04 DIAGNOSIS — D638 Anemia in other chronic diseases classified elsewhere: Secondary | ICD-10-CM | POA: Diagnosis present

## 2017-10-04 DIAGNOSIS — R509 Fever, unspecified: Secondary | ICD-10-CM

## 2017-10-04 DIAGNOSIS — Z1611 Resistance to penicillins: Secondary | ICD-10-CM | POA: Diagnosis present

## 2017-10-04 DIAGNOSIS — Z933 Colostomy status: Secondary | ICD-10-CM

## 2017-10-04 DIAGNOSIS — N39498 Other specified urinary incontinence: Secondary | ICD-10-CM | POA: Diagnosis not present

## 2017-10-04 DIAGNOSIS — Z9049 Acquired absence of other specified parts of digestive tract: Secondary | ICD-10-CM | POA: Diagnosis not present

## 2017-10-04 DIAGNOSIS — Z936 Other artificial openings of urinary tract status: Secondary | ICD-10-CM

## 2017-10-04 DIAGNOSIS — L0291 Cutaneous abscess, unspecified: Secondary | ICD-10-CM

## 2017-10-04 DIAGNOSIS — Z8051 Family history of malignant neoplasm of kidney: Secondary | ICD-10-CM

## 2017-10-04 DIAGNOSIS — Z8042 Family history of malignant neoplasm of prostate: Secondary | ICD-10-CM | POA: Diagnosis not present

## 2017-10-04 DIAGNOSIS — C679 Malignant neoplasm of bladder, unspecified: Secondary | ICD-10-CM | POA: Diagnosis present

## 2017-10-04 DIAGNOSIS — R109 Unspecified abdominal pain: Secondary | ICD-10-CM | POA: Diagnosis not present

## 2017-10-04 DIAGNOSIS — Z8546 Personal history of malignant neoplasm of prostate: Secondary | ICD-10-CM | POA: Diagnosis not present

## 2017-10-04 DIAGNOSIS — Z9079 Acquired absence of other genital organ(s): Secondary | ICD-10-CM | POA: Diagnosis not present

## 2017-10-04 DIAGNOSIS — E860 Dehydration: Secondary | ICD-10-CM | POA: Diagnosis present

## 2017-10-04 DIAGNOSIS — Z800 Family history of malignant neoplasm of digestive organs: Secondary | ICD-10-CM

## 2017-10-04 DIAGNOSIS — Z87891 Personal history of nicotine dependence: Secondary | ICD-10-CM

## 2017-10-04 DIAGNOSIS — N368 Other specified disorders of urethra: Secondary | ICD-10-CM | POA: Diagnosis not present

## 2017-10-04 DIAGNOSIS — B379 Candidiasis, unspecified: Secondary | ICD-10-CM

## 2017-10-04 DIAGNOSIS — T83038A Leakage of other indwelling urethral catheter, initial encounter: Secondary | ICD-10-CM | POA: Diagnosis not present

## 2017-10-04 DIAGNOSIS — Y836 Removal of other organ (partial) (total) as the cause of abnormal reaction of the patient, or of later complication, without mention of misadventure at the time of the procedure: Secondary | ICD-10-CM | POA: Diagnosis present

## 2017-10-04 DIAGNOSIS — Z906 Acquired absence of other parts of urinary tract: Secondary | ICD-10-CM | POA: Diagnosis not present

## 2017-10-04 DIAGNOSIS — E669 Obesity, unspecified: Secondary | ICD-10-CM | POA: Diagnosis present

## 2017-10-04 DIAGNOSIS — N9989 Other postprocedural complications and disorders of genitourinary system: Secondary | ICD-10-CM

## 2017-10-04 DIAGNOSIS — Z6834 Body mass index (BMI) 34.0-34.9, adult: Secondary | ICD-10-CM

## 2017-10-04 DIAGNOSIS — K219 Gastro-esophageal reflux disease without esophagitis: Secondary | ICD-10-CM | POA: Diagnosis present

## 2017-10-04 DIAGNOSIS — E876 Hypokalemia: Secondary | ICD-10-CM | POA: Diagnosis present

## 2017-10-04 DIAGNOSIS — Z8551 Personal history of malignant neoplasm of bladder: Secondary | ICD-10-CM | POA: Diagnosis not present

## 2017-10-04 DIAGNOSIS — B3741 Candidal cystitis and urethritis: Secondary | ICD-10-CM | POA: Diagnosis not present

## 2017-10-04 DIAGNOSIS — Z801 Family history of malignant neoplasm of trachea, bronchus and lung: Secondary | ICD-10-CM

## 2017-10-04 DIAGNOSIS — A4159 Other Gram-negative sepsis: Secondary | ICD-10-CM | POA: Diagnosis present

## 2017-10-04 DIAGNOSIS — T8149XA Infection following a procedure, other surgical site, initial encounter: Secondary | ICD-10-CM

## 2017-10-04 DIAGNOSIS — B961 Klebsiella pneumoniae [K. pneumoniae] as the cause of diseases classified elsewhere: Secondary | ICD-10-CM | POA: Diagnosis present

## 2017-10-04 DIAGNOSIS — C678 Malignant neoplasm of overlapping sites of bladder: Secondary | ICD-10-CM | POA: Diagnosis not present

## 2017-10-04 DIAGNOSIS — N1330 Unspecified hydronephrosis: Secondary | ICD-10-CM | POA: Diagnosis not present

## 2017-10-04 DIAGNOSIS — T8143XA Infection following a procedure, organ and space surgical site, initial encounter: Secondary | ICD-10-CM | POA: Diagnosis not present

## 2017-10-04 DIAGNOSIS — K651 Peritoneal abscess: Secondary | ICD-10-CM

## 2017-10-04 DIAGNOSIS — N179 Acute kidney failure, unspecified: Secondary | ICD-10-CM | POA: Diagnosis not present

## 2017-10-04 DIAGNOSIS — R7881 Bacteremia: Secondary | ICD-10-CM | POA: Diagnosis present

## 2017-10-04 DIAGNOSIS — A419 Sepsis, unspecified organism: Secondary | ICD-10-CM | POA: Diagnosis not present

## 2017-10-04 DIAGNOSIS — K760 Fatty (change of) liver, not elsewhere classified: Secondary | ICD-10-CM | POA: Diagnosis not present

## 2017-10-04 DIAGNOSIS — R0602 Shortness of breath: Secondary | ICD-10-CM | POA: Diagnosis not present

## 2017-10-04 HISTORY — DX: Malignant neoplasm of bladder, unspecified: C67.9

## 2017-10-04 LAB — CBC
HCT: 36.6 % — ABNORMAL LOW (ref 39.0–52.0)
Hemoglobin: 11.6 g/dL — ABNORMAL LOW (ref 13.0–17.0)
MCH: 25.3 pg — AB (ref 26.0–34.0)
MCHC: 31.7 g/dL (ref 30.0–36.0)
MCV: 79.7 fL (ref 78.0–100.0)
PLATELETS: 303 10*3/uL (ref 150–400)
RBC: 4.59 MIL/uL (ref 4.22–5.81)
RDW: 14.9 % (ref 11.5–15.5)
WBC: 18.4 10*3/uL — ABNORMAL HIGH (ref 4.0–10.5)

## 2017-10-04 LAB — COMPREHENSIVE METABOLIC PANEL
ALT: 31 U/L (ref 17–63)
AST: 28 U/L (ref 15–41)
Albumin: 2.7 g/dL — ABNORMAL LOW (ref 3.5–5.0)
Alkaline Phosphatase: 67 U/L (ref 38–126)
Anion gap: 8 (ref 5–15)
BUN: 23 mg/dL — AB (ref 6–20)
CALCIUM: 8.2 mg/dL — AB (ref 8.9–10.3)
CO2: 20 mmol/L — ABNORMAL LOW (ref 22–32)
CREATININE: 1.66 mg/dL — AB (ref 0.61–1.24)
Chloride: 112 mmol/L — ABNORMAL HIGH (ref 101–111)
GFR, EST AFRICAN AMERICAN: 48 mL/min — AB (ref >60)
GFR, EST NON AFRICAN AMERICAN: 42 mL/min — AB (ref >60)
GLUCOSE: 118 mg/dL — AB (ref 65–99)
Potassium: 4.2 mmol/L (ref 3.5–5.1)
SODIUM: 140 mmol/L (ref 135–145)
TOTAL PROTEIN: 6.5 g/dL (ref 6.5–8.1)
Total Bilirubin: 0.7 mg/dL (ref 0.3–1.2)

## 2017-10-04 LAB — LACTIC ACID, PLASMA
LACTIC ACID, VENOUS: 2 mmol/L — AB (ref 0.5–1.9)
Lactic Acid, Venous: 1.7 mmol/L (ref 0.5–1.9)

## 2017-10-04 LAB — HIV ANTIBODY (ROUTINE TESTING W REFLEX): HIV Screen 4th Generation wRfx: NONREACTIVE

## 2017-10-04 LAB — PROCALCITONIN: Procalcitonin: 4.58 ng/mL

## 2017-10-04 MED ORDER — VANCOMYCIN HCL 10 G IV SOLR
1750.0000 mg | INTRAVENOUS | Status: DC
  Filled 2017-10-04: qty 1750

## 2017-10-04 MED ORDER — GUAIFENESIN 100 MG/5ML PO SOLN
10.0000 mL | Freq: Once | ORAL | Status: AC
  Administered 2017-10-04: 200 mg via ORAL
  Filled 2017-10-04: qty 20

## 2017-10-04 MED ORDER — OXYCODONE-ACETAMINOPHEN 5-325 MG PO TABS: 1.0000 | ORAL_TABLET | ORAL | Status: DC | PRN

## 2017-10-04 MED ORDER — ENOXAPARIN SODIUM 40 MG/0.4ML ~~LOC~~ SOLN
40.0000 mg | SUBCUTANEOUS | Status: DC
  Administered 2017-10-04 – 2017-10-14 (×10): 40 mg via SUBCUTANEOUS
  Filled 2017-10-04 (×10): qty 0.4

## 2017-10-04 MED ORDER — ONDANSETRON HCL 4 MG/2ML IJ SOLN
4.0000 mg | Freq: Four times a day (QID) | INTRAMUSCULAR | Status: DC | PRN
  Administered 2017-10-04 – 2017-10-11 (×14): 4 mg via INTRAVENOUS
  Filled 2017-10-04 (×15): qty 2

## 2017-10-04 MED ORDER — FENTANYL CITRATE (PF) 100 MCG/2ML IJ SOLN
INTRAMUSCULAR | Status: AC
  Filled 2017-10-04: qty 4

## 2017-10-04 MED ORDER — MIDAZOLAM HCL 2 MG/2ML IJ SOLN
INTRAMUSCULAR | Status: AC | PRN
  Administered 2017-10-04 (×2): 1 mg via INTRAVENOUS

## 2017-10-04 MED ORDER — DOCUSATE SODIUM 100 MG PO CAPS
200.0000 mg | ORAL_CAPSULE | Freq: Two times a day (BID) | ORAL | Status: DC
  Administered 2017-10-04 – 2017-10-06 (×5): 200 mg via ORAL
  Filled 2017-10-04 (×5): qty 2

## 2017-10-04 MED ORDER — MIDAZOLAM HCL 2 MG/2ML IJ SOLN
INTRAMUSCULAR | Status: AC
  Filled 2017-10-04: qty 4

## 2017-10-04 MED ORDER — HYDROMORPHONE HCL 1 MG/ML IJ SOLN
1.0000 mg | INTRAMUSCULAR | Status: DC | PRN
  Administered 2017-10-04 (×4): 1 mg via INTRAVENOUS
  Filled 2017-10-04 (×4): qty 1

## 2017-10-04 MED ORDER — ACETAMINOPHEN 325 MG PO TABS
650.0000 mg | ORAL_TABLET | Freq: Once | ORAL | Status: AC
  Administered 2017-10-04: 650 mg via ORAL
  Filled 2017-10-04: qty 2

## 2017-10-04 MED ORDER — FENTANYL CITRATE (PF) 100 MCG/2ML IJ SOLN
INTRAMUSCULAR | Status: AC | PRN
  Administered 2017-10-04 (×4): 25 ug via INTRAVENOUS

## 2017-10-04 MED ORDER — HYDROMORPHONE HCL 1 MG/ML IJ SOLN
1.0000 mg | INTRAMUSCULAR | Status: DC | PRN
  Administered 2017-10-04 – 2017-10-06 (×9): 1 mg via INTRAVENOUS
  Filled 2017-10-04 (×9): qty 1

## 2017-10-04 MED ORDER — VANCOMYCIN HCL 10 G IV SOLR
1250.0000 mg | INTRAVENOUS | Status: DC
  Administered 2017-10-04 – 2017-10-05 (×2): 1250 mg via INTRAVENOUS
  Filled 2017-10-04 (×2): qty 1250

## 2017-10-04 MED ORDER — ACETAMINOPHEN 325 MG PO TABS
650.0000 mg | ORAL_TABLET | Freq: Four times a day (QID) | ORAL | Status: DC | PRN
  Administered 2017-10-04 – 2017-10-11 (×9): 650 mg via ORAL
  Filled 2017-10-04 (×9): qty 2

## 2017-10-04 MED ORDER — SODIUM CHLORIDE 0.9 % IV SOLN
1500.0000 mg | Freq: Once | INTRAVENOUS | Status: DC
  Filled 2017-10-04: qty 1500

## 2017-10-04 MED ORDER — PANTOPRAZOLE SODIUM 40 MG PO TBEC
40.0000 mg | DELAYED_RELEASE_TABLET | Freq: Every day | ORAL | Status: DC
  Administered 2017-10-04 – 2017-10-06 (×3): 40 mg via ORAL
  Filled 2017-10-04 (×4): qty 1

## 2017-10-04 MED ORDER — SODIUM CHLORIDE 0.9 % IV SOLN
INTRAVENOUS | Status: AC | PRN
  Administered 2017-10-04: 50 mL/h via INTRAVENOUS

## 2017-10-04 MED ORDER — SODIUM CHLORIDE 0.9 % IV SOLN
INTRAVENOUS | Status: DC
  Administered 2017-10-04 – 2017-10-11 (×14): via INTRAVENOUS

## 2017-10-04 MED ORDER — PIPERACILLIN-TAZOBACTAM 3.375 G IVPB
3.3750 g | Freq: Three times a day (TID) | INTRAVENOUS | Status: DC
  Administered 2017-10-04 – 2017-10-11 (×22): 3.375 g via INTRAVENOUS
  Filled 2017-10-04 (×21): qty 50

## 2017-10-04 MED ORDER — PIPERACILLIN-TAZOBACTAM 3.375 G IVPB 30 MIN
3.3750 g | Freq: Once | INTRAVENOUS | Status: DC
  Filled 2017-10-04 (×2): qty 50

## 2017-10-04 MED ORDER — ONDANSETRON HCL 4 MG PO TABS
4.0000 mg | ORAL_TABLET | Freq: Four times a day (QID) | ORAL | Status: DC | PRN
  Filled 2017-10-04: qty 1

## 2017-10-04 MED ORDER — SODIUM CHLORIDE 0.9 % IV BOLUS (SEPSIS)
1000.0000 mL | Freq: Once | INTRAVENOUS | Status: AC
  Administered 2017-10-04: 1000 mL via INTRAVENOUS

## 2017-10-04 MED ORDER — PREMIER PROTEIN SHAKE
11.0000 [oz_av] | Freq: Two times a day (BID) | ORAL | Status: DC
  Administered 2017-10-04 – 2017-10-10 (×8): 11 [oz_av] via ORAL
  Filled 2017-10-04 (×13): qty 325.31

## 2017-10-04 NOTE — Discharge Instructions (Addendum)
Flush Right sided drain with 5cc of normal saline daily.  Document output when drain emptied. Empty left nephrostomy tube as needed.  No need to flush this tube as long as it is working well.  Surgical [facility] Care Surgical drains are used to remove extra fluid that normally builds up in a surgical wound after surgery. A surgical drain helps to heal a surgical wound. Different kinds of surgical drains include:  Active drains. These drains use suction to pull drainage away from the surgical wound. Drainage flows through a tube to a container outside of the body. It is important to keep the bulb or the drainage container flat (compressed) at all times, except while you empty it. Flattening the bulb or container creates suction. The two most common types of active drains are bulb drains and Hemovac drains.  Passive drains. These drains allow fluid to drain naturally, by gravity. Drainage flows through a tube to a bandage (dressing) or a container outside of the body. Passive drains do not need to be emptied. The most common type of passive drain is the Penrose drain.  A drain is placed during surgery. Immediately after surgery, drainage is usually bright red and a little thicker than water. The drainage may gradually turn yellow or pink and become thinner. It is likely that your health care provider will remove the drain when the drainage stops or when the amount decreases to 1-2 Tbsp (15-30 mL) during a 24-hour period. How to care for your surgical drain  Keep the skin around the drain dry and covered with a dressing at all times.  Check your drain area every day for signs of infection. Check for: ? More redness, swelling, or pain. ? Pus or a bad smell. ? Cloudy drainage. Follow instructions from your health care provider about how to take care of your drain and how to change your dressing. Change your dressing at least one time every day. Change it more often if needed to keep the dressing dry.  Make sure you: 1. Gather your supplies, including: ? Tape. ? Germ-free cleaning solution (sterile saline). ? Split gauze drain sponge: 4 x 4 inches (10 x 10 cm). ? Gauze square: 4 x 4 inches (10 x 10 cm). 2. Wash your hands with soap and water before you change your dressing. If soap and water are not available, use hand sanitizer. 3. Remove the old dressing. Avoid using scissors to do that. 4. Use sterile saline to clean your skin around the drain. 5. Place the tube through the slit in a drain sponge. Place the drain sponge so that it covers your wound. 6. Place the gauze square or another drain sponge on top of the drain sponge that is on the wound. Make sure the tube is between those layers. 7. Tape the dressing to your skin. 8. If you have an active bulb or Hemovac drain, tape the drainage tube to your skin 1-2 inches (2.5-5 cm) below the place where the tube enters your body. Taping keeps the tube from pulling on any stitches (sutures) that you have. 9. Wash your hands with soap and water. 10. Write down the color of your drainage and how often you change your dressing.  How to empty your active bulb or Hemovac drain 1. Make sure that you have a measuring cup that you can empty your drainage into. 2. Wash your hands with soap and water. If soap and water are not available, use hand sanitizer. 3. Gently move your  fingers down the tube while squeezing very lightly. This is called stripping the tube. This clears any drainage, clots, or tissue from the tube. ? Do not pull on the tube. ? You may need to strip the tube several times every day to keep the tube clear. 4. Open the bulb cap or the drain plug. Do not touch the inside of the cap or the bottom of the plug. 5. Empty all of the drainage into the measuring cup. 6. Compress the bulb or the container and replace the cap or the plug. To compress the bulb or the container, squeeze it firmly in the middle while you close the cap or plug the  container. 7. Write down the amount of drainage that you have in each 24-hour period. If you have less than 2 Tbsp (30 mL) of drainage during 24 hours, contact your health care provider. 8. Flush the drainage down the toilet. 9. Wash your hands with soap and water. Contact a health care provider if:  You have more redness, swelling, or pain around your drain area.  The amount of drainage that you have is increasing instead of decreasing.  You have pus or a bad smell coming from your drain area.  You have a fever.  You have drainage that is cloudy.  There is a sudden stop or a sudden decrease in the amount of drainage that you have.  Your tube falls out.  Your active draindoes not stay compressedafter you empty it. This information is not intended to replace advice given to you by your health care provider. Make sure you discuss any questions you have with your health care provider. Document Released: 10/26/2000 Document Revised: 04/05/2016 Document Reviewed: 05/18/2015 Elsevier Interactive Patient Education  2018 [place]. Moderate Conscious Sedation, Adult, Care After These instructions provide you with information about caring for yourself after your procedure. Your health care provider may also give you more specific instructions. Your treatment has been planned according to current medical practices, but problems sometimes occur. Call your health care provider if you have any problems or questions after your procedure. What can I expect after the procedure? After your procedure, it is common:  To feel sleepy for several hours.  To feel clumsy and have poor balance for several hours.  To have poor judgment for several hours.  To vomit if you eat too soon.  Follow these instructions at home: For at least 24 hours after the procedure:   Do not: ? Participate in activities where you could fall or become injured. ? Drive. ? Use heavy machinery. ? Drink  alcohol. ? Take sleeping pills or medicines that cause drowsiness. ? Make important decisions or sign legal documents. ? Take care of children on your own.  Rest. Eating and drinking  Follow the diet recommended by your health care provider.  If you vomit: ? Drink water, juice, or soup when you can drink without vomiting. ? Make sure you have little or no nausea before eating solid foods. General instructions  Have a responsible adult stay with you until you are awake and alert.  Take over-the-counter and prescription medicines only as told by your health care provider.  If you smoke, do not smoke without supervision.  Keep all follow-up visits as told by your health care provider. This is important. Contact a health care provider if:  You keep feeling nauseous or you keep vomiting.  You feel light-headed.  You develop a rash.  You have a fever. Get help  right away if:  You have trouble breathing. This information is not intended to replace advice given to you by your health care provider. Make sure you discuss any questions you have with your health care provider. Document Released: 08/19/2013 Document Revised: 04/02/2016 Document Reviewed: 02/18/2016 Elsevier Interactive Patient Education  [employer].

## 2017-10-04 NOTE — Consult Note (Signed)
Chief Complaint: Patient was seen in consultation today for CT guided drainage of pelvic fluid collection  Referring Physician(s): Eskridge,M  Supervising Physician: Aletta Edouard  Patient Status: [facility]  History of Present Illness: Kristopher Thompson, Kristopher Thompson is a 66 y.o. male with history of prostate and bladder cancers, status post prostatectomy 2013, cystectomy with ileal conduit formation on 02/19/80 complicated by rectal injury requiring colostomy.  Patient is also status post ureteral stent removal on 10/01/17.  He was recently transferred from [facility] ED secondary to several day history of abdominal/pelvic pain, elevated WBC/creatinine.  Recent imaging has revealed multiple high attenuation areas of free fluid throughout the peritoneal cavity consistent with extravasated urine with largest/highest density collection just anterior to the ileal conduit.Request now received from urology for image guided drainage of this collection.  Past Medical History:  Diagnosis Date  . Bladder cancer ([place]) dx'd 07/2017  . Cancer [facility])    PROSTATE CANCER  . Complication of anesthesia    AFTER GB SURGERY PT FELT LIKE FLOOR AND BED VIBRATING--WOKE UP TO FIND HIMSELF STANDING UP AT BEDSIDE.  Marland Kitchen GERD (gastroesophageal reflux disease)   . H/O hiatal hernia   . RBBB    NO PROBLEMS    Past Surgical History:  Procedure Laterality Date  . APPENDECTOMY  1972  . CHOLECYSTECTOMY  1985  . CYSTOSCOPY WITH RETROGRADE PYELOGRAM, URETEROSCOPY AND STENT PLACEMENT Right 06/10/2017   Procedure: CYSTOSCOPY WITH RIGHT URETEROSCOPY WITH RIGHT STENT PLACEMENT;  Surgeon: Raynelle Bring, MD;  Location: [facility] ORS;  Service: Urology;  Laterality: Right;  . ROBOT ASSISTED LAPAROSCOPIC RADICAL PROSTATECTOMY  10/02/2012   Procedure: ROBOTIC ASSISTED LAPAROSCOPIC RADICAL PROSTATECTOMY LEVEL 2;  Surgeon: Dutch Gray, MD;  Location: [facility];  Service: Urology;  Laterality: N/A;  . ROBOTIC ASSISTED LAPAROSCOPIC BLADDER  DIVERTICULECTOMY N/A 08/08/2017   Procedure: XI ROBOTIC ASSISTED LAPAROSCOPIC RADICAL CYSTECTOMY COVERTED TO OPEN PELVIC LYMPHADNECTOMY BILATERAL AND ILEAL CONDUIT URINARY DIVERSION;  Surgeon: Raynelle Bring, MD;  Location: [facility];  Service: Urology;  Laterality: N/A;  . TRANSURETHRAL RESECTION OF BLADDER TUMOR  06/10/2017   Procedure: TRANSURETHRAL RESECTION OF BLADDER TUMOR (TURBT);  Surgeon: Raynelle Bring, MD;  Location: [facility];  Service: Urology;;  . XI ROBOT ABDOMINAL PERINEAL RESECTION N/A 08/08/2017   Procedure: REPAIR OF RECTAL TEAR POSSIBLE PARTIAL PROCTECTOMY, CREATION OF  OSTOMY;  Surgeon: Leighton Ruff, MD;  Location: [facility];  Service: General;  Laterality: N/A;    Allergies: Demerol [meperidine]  Medications: Prior to Admission medications   Medication Sig Start Date End Date Taking? Authorizing Provider  acetaminophen (TYLENOL) 500 MG tablet Take 1,000 mg by mouth daily as needed for mild pain. For pain   Yes [provider]  docusate sodium (COLACE) 100 MG capsule Take 2 capsules (200 mg total) by mouth 2 (two) times daily. 08/14/17 08/14/18 Yes McCormickLenna Sciara, MD  esomeprazole (NEXIUM) 20 MG capsule Take 20 mg by mouth daily as needed (heartburn).   Yes [provider]  HYDROcodone-acetaminophen (NORCO/VICODIN) 5-325 MG tablet Take 1-2 tablets by mouth every 6 (six) hours as needed for moderate pain (pain). 08/14/17  Yes Stasia Cavalier, MD     No family history on file.  Social History   Socioeconomic History  . Marital status: Married    Spouse name: None  . Number of children: None  . Years of education: None  . Highest education level: None  Social Needs  . Financial resource strain: None  . Food insecurity - worry: None  .  Food insecurity - inability: None  . Transportation needs - medical: None  . Transportation needs - non-medical: None  Occupational History  . None  Tobacco Use  . Smoking status: Former Research scientist (life sciences)  . Smokeless  tobacco: Never Used  . Tobacco comment: 66 years old to 66 years old  Substance and Sexual Activity  . Alcohol use: Yes    Comment: occasional  . Drug use: No  . Sexual activity: Yes  Other Topics Concern  . None  Social History Narrative  . None      Review of Systems see above; currently afebrile, denies respiratory complaints, nausea, vomiting or bleeding.  Vital Signs: BP 112/75 (BP Location: Right Arm)   Pulse (!) 111   Temp 98.4 F (36.9 C) (Oral)   Resp 18   Ht 6' (1.829 m)   Wt 252 lb 13.9 oz (114.7 kg)   SpO2 99%   BMI 34.29 kg/m   Physical Exam awake, alert.  Chest clear to auscultation bilaterally.  Heart with tachycardic but regular rhythm.  Abdomen soft, diffusely tender, more so in the pelvic region, few BS,  intact ileal conduit and colostomy; no lower extremity edema.  Imaging: Ct Abdomen Pelvis Wo Contrast  Result Date: 10/04/2017 CLINICAL DATA:  History of cystectomy with ileal conduit and urinary diversion. Status post ureteral stent removal with development of abdominal pain, low-grade fever and outside CT demonstrating abnormal fluid collections. EXAM: CT ABDOMEN AND PELVIS WITHOUT CONTRAST TECHNIQUE: Multidetector CT imaging of the abdomen and pelvis was performed following the standard protocol without IV contrast. COMPARISON:  05/24/2017 FINDINGS: Lower chest: Bibasilar atelectasis. Hepatobiliary: No focal liver abnormality is seen. Status post cholecystectomy. No biliary dilatation. Pancreas: Unremarkable. No pancreatic ductal dilatation or surrounding inflammatory changes. Spleen: Normal in size without focal abnormality. Adrenals/Urinary Tract: Excreted contrast material is noted in both collecting systems of the kidneys and ureters. The kidneys do not demonstrate significant hydronephrosis. As the ureters are followed inferiorly, contrast is present in the ileal conduit as well as extravasated contrast immediately anterior to the conduit in the  peritoneal cavity. There are also multiple additional high density fluid collections throughout the peritoneal cavity consistent with extravasated urine. At least 10 to 12 additional areas of high attenuation fluid are present. Stomach/Bowel: Bowel shows no evidence of obstruction or ileus. No free air identified. Ileostomy in the right lower quadrant and colostomy in the left lower quadrant appear unremarkable. Vascular/Lymphatic: No enlarged lymph nodes identified. The abdominal aorta is normal in caliber. Reproductive: The bladder has been removed. The prostate gland also appears likely to have been removed. Other: Small right inguinal hernia contains fat. Musculoskeletal: No acute or significant osseous findings. IMPRESSION: Multiple high attenuation areas of free fluid throughout the peritoneal cavity consistent with extravasated urine. The highest density collection is just anterior to the ileal conduit. Electronically Signed   By: Aletta Edouard M.D.   On: 10/04/2017 08:52    Labs:  CBC: Recent Labs    08/12/17 0534 08/13/17 0520 08/14/17 0510 10/04/17 0522  WBC 10.2 9.8 10.3 18.4*  HGB 10.0* 9.9* 9.3* 11.6*  HCT 28.8* 28.0* 27.2* 36.6*  PLT 188 120* 215 303    COAGS: No results for input(s): INR, APTT in the last 8760 hours.  BMP: Recent Labs    08/12/17 0534 08/13/17 0520 08/14/17 0510 10/04/17 0522  NA 141 139 142 140  K 3.5 3.7 3.6 4.2  CL 108 109 111 112*  CO2 27 25 26  20*  GLUCOSE 115*  116* 114* 118*  BUN 11 7 10  23*  CALCIUM 7.9* 7.8* 8.1* 8.2*  CREATININE 0.80 0.77 0.75 1.66*  GFRNONAA >60 >60 >60 42*  GFRAA >60 >60 >60 48*    LIVER FUNCTION TESTS: Recent Labs    10/04/17 0522  BILITOT 0.7  AST 28  ALT 31  ALKPHOS 67  PROT 6.5  ALBUMIN 2.7*    TUMOR MARKERS: No results for input(s): AFPTM, CEA, CA199, CHROMGRNA in the last 8760 hours.  Assessment and Plan: 66 y.o. male with history of prostate and bladder cancers, status post prostatectomy 2013,  cystectomy with ileal conduit formation on 1/76/16 complicated by rectal injury requiring colostomy.  Patient is also status post ureteral stent removal on 10/01/17.  He was recently transferred from [facility] ED secondary to several day history of abdominal/pelvic pain, elevated WBC/creatinine.  Recent imaging has revealed multiple high attenuation areas of free fluid throughout the peritoneal cavity consistent with extravasated urine with largest/highest density collection just anterior to the ileal conduit. Request now received from urology for image guided drainage of this collection.  Latest imaging studies have been reviewed by Dr. Kathlene Cote.Risks and benefits discussed with the patient/spouse including bleeding, infection, damage to adjacent structures, bowel perforation/fistula connection, and sepsis.All of the patient's questions were answered, patient is agreeable to proceed.Consent signed and in chart.Procedure planned for today.      Thank you for this interesting consult.  I greatly enjoyed meeting Kristopher Thompson, Kristopher Thompson and look forward to participating in their care.  A copy of this report was sent to the requesting provider on this date.  Electronically Signed: D. Rowe Robert, PA-C 10/04/2017, 9:37 AM   I spent a total of 30 minutes  in face to face in clinical consultation, greater than 50% of which was counseling/coordinating care for image guided pelvic fluid collection drainage

## 2017-10-04 NOTE — Progress Notes (Signed)
Abdominal dressing changed. Current home orders - wet to dry per wife.  Barbee Shropshire. Brigitte Pulse, RN

## 2017-10-04 NOTE — Care Management Note (Signed)
Case Management Note  Patient Details  Name: Kristopher Thompson, DDS MRN: [id number] Date of Birth: 1951-01-03  Subjective/Objective:66 y/o m admitted w/Sepsis. Hx: cystectomy,ileal conduit diversion,colostomy. From home. Active w/[facility]-HHRN-rep [facility] aware & following for d/c HHRN orders,& [id number]-call her @ [phone].                  Action/Plan:d/c home w/HHC.   Expected Discharge Date:                  Expected Discharge Plan:  [place]  In-House Referral:     Discharge planning Services  CM Consult  Post Acute Care Choice:  Home Health(Active w/[facility]-HHRN) Choice offered to:  Patient  DME Arranged:    DME Agency:     HH Arranged:  RN [place] Agency:  [place]  Status of Service:  In process, will continue to follow  If discussed at Long Length of Stay Meetings, dates discussed:    Additional Comments:  Dessa Phi, RN 10/04/2017, 11:34 AM

## 2017-10-04 NOTE — Progress Notes (Signed)
  Pt seen again. WBC better. Cr improving. He looks better. No pallor. More comfortable. Abd - soft, non-distended, tender in the upper quadrant. HR down to 110. UOP better -- about 600 ml clear urine in bag.   Discussed CT with Dr. Kathlene Cote  -- appears to be urine leak possible left ureter. Will place drain adjacent to conduit (highest density collection) to drain urinoma later today.

## 2017-10-04 NOTE — Progress Notes (Signed)
3CRITICAL VALUE ALERT  Critical Value:  Lactic acid - 2.0  Date & Time Notied:  10/04/2017 0900  Provider Notified: Karleen Hampshire  Orders Received/Actions taken:

## 2017-10-04 NOTE — H&P (Signed)
TRH H&P    Patient Demographics:    Kristopher Thompson, is a 66 y.o. male  MRN: [id number]  DOB - 1951-07-12  Admit Date - 10/04/2017    Outpatient Primary MD for the patient is Lorene Dy, MD  Patient coming from: [facility]  No chief complaint on file.     HPI:    Kristopher Thompson  is a 66 y.o. male,.. With history of prostate cancer, bladder cancer status post radical cystectomy and ileal conduit urinary diversion on 0/27/2536, complicated by rectal injury which was repaired by general surgery status post diverting colostomy came to hospital with complaints of abdominal pain for past 2 days. At [facility] patient was found to have WBC 28,000, acute kidney injury with creatinine of 1.68, abnormal UA.  CT abdomen/pelvis showed bilateral moderate hydronephrosis, inflammatory process per radiologist and fluid accumulation 5x6 centimeters in the insertion site of continued concerning for abscess.  Urology Dr. Junious Silk was consulted by ED physician. Patient was transferred to [facility].  Denies nausea vomiting diarrhea. Denies chest pain or shortness of breath Denies dizziness or blurred vision     Review of systems:      All other systems reviewed and are negative.   With Past History of the following :    Past Medical History:  Diagnosis Date  . Cancer [facility])    PROSTATE CANCER  . Complication of anesthesia    AFTER GB SURGERY PT FELT LIKE FLOOR AND BED VIBRATING--WOKE UP TO FIND HIMSELF STANDING UP AT BEDSIDE.  Marland Kitchen GERD (gastroesophageal reflux disease)   . H/O hiatal hernia   . RBBB    NO PROBLEMS      Past Surgical History:  Procedure Laterality Date  . APPENDECTOMY  1972  . CHOLECYSTECTOMY  1985  . CYSTOSCOPY WITH RETROGRADE PYELOGRAM, URETEROSCOPY AND STENT PLACEMENT Right 06/10/2017   Procedure: CYSTOSCOPY WITH RIGHT URETEROSCOPY WITH RIGHT STENT  PLACEMENT;  Surgeon: Raynelle Bring, MD;  Location: [facility];  Service: Urology;  Laterality: Right;  . ROBOT ASSISTED LAPAROSCOPIC RADICAL PROSTATECTOMY  10/02/2012   Procedure: ROBOTIC ASSISTED LAPAROSCOPIC RADICAL PROSTATECTOMY LEVEL 2;  Surgeon: Dutch Gray, MD;  Location: [facility];  Service: Urology;  Laterality: N/A;  . ROBOTIC ASSISTED LAPAROSCOPIC BLADDER DIVERTICULECTOMY N/A 08/08/2017   Procedure: XI ROBOTIC ASSISTED LAPAROSCOPIC RADICAL CYSTECTOMY COVERTED TO OPEN PELVIC LYMPHADNECTOMY BILATERAL AND ILEAL CONDUIT URINARY DIVERSION;  Surgeon: Raynelle Bring, MD;  Location: [facility];  Service: Urology;  Laterality: N/A;  . TRANSURETHRAL RESECTION OF BLADDER TUMOR  06/10/2017   Procedure: TRANSURETHRAL RESECTION OF BLADDER TUMOR (TURBT);  Surgeon: Raynelle Bring, MD;  Location: [facility];  Service: Urology;;  . XI ROBOT ABDOMINAL PERINEAL RESECTION N/A 08/08/2017   Procedure: REPAIR OF RECTAL TEAR POSSIBLE PARTIAL PROCTECTOMY, CREATION OF  OSTOMY;  Surgeon: Leighton Ruff, MD;  Location: [facility];  Service: General;  Laterality: N/A;      Social History:      Social History   Tobacco Use  . Smoking status: Former Research scientist (life sciences)  . Smokeless tobacco: Never Used  . Tobacco  comment: 66 years old to 66 years old  Substance Use Topics  . Alcohol use: Yes    Comment: occasional       Family History :   Father died from kidney cancer, grandfather had prostate cancer, sister died from appendix cancer, mother had lung cancer   Home Medications:   Prior to Admission medications   Medication Sig Start Date End Date Taking? Authorizing Provider  acetaminophen (TYLENOL) 500 MG tablet Take 1,000 mg by mouth daily as needed for mild pain. For pain   Yes [provider]  docusate sodium (COLACE) 100 MG capsule Take 2 capsules (200 mg total) by mouth 2 (two) times daily. 08/14/17 08/14/18 Yes McCormickLenna Sciara, MD  esomeprazole (NEXIUM) 20 MG capsule Take 20 mg by mouth daily as needed (heartburn).    Yes [provider]  HYDROcodone-acetaminophen (NORCO/VICODIN) 5-325 MG tablet Take 1-2 tablets by mouth every 6 (six) hours as needed for moderate pain (pain). 08/14/17  Yes Stasia Cavalier, MD     Allergies:     Allergies  Allergen Reactions  . Demerol [Meperidine] Other (See Comments)    SEVERE NAUSEA     Physical Exam:   Vitals  Blood pressure 134/71, pulse (!) 121, temperature 99.6 F (37.6 C), temperature source Oral, resp. rate 18, height 6' (1.829 m), weight 114.7 kg (252 lb 13.9 oz), SpO2 99 %.  1.  General: Appears in no acute distress  2. Psychiatric:  Intact judgement and  insight, awake alert, oriented x 3.  3. Neurologic: No focal neurological deficits, all cranial nerves intact.Strength 5/5 all 4 extremities, sensation intact all 4 extremities, plantars down going.  4. Eyes :  anicteric sclerae, moist conjunctivae with no lid lag. PERRLA.  5. ENMT:  Oropharynx clear with moist mucous membranes and good dentition  6. Neck:  supple, no cervical lymphadenopathy appriciated, No thyromegaly  7. Respiratory : Normal respiratory effort, good air movement bilaterally,clear to  auscultation bilaterally  8. Cardiovascular : RRR, no gallops, rubs or murmurs, no leg edema  9. Gastrointestinal:  Positive bowel sounds, abdomen soft, non-tender to palpation,no hepatosplenomegaly, no rigidity or guarding diverting colostomy and  ileal conduit in place     10. Skin:  No cyanosis, normal texture and turgor, no rash, lesions or ulcers  11.Musculoskeletal:  Good muscle tone,  joints appear normal , no effusions,  normal range of motion    Data Review:    CBC No results for input(s): WBC, HGB, HCT, PLT, MCV, MCH, MCHC, RDW, LYMPHSABS, MONOABS, EOSABS, BASOSABS, BANDABS in the last 168 hours.  Invalid input(s): NEUTRABS,  BANDSABD ------------------------------------------------------------------------------------------------------------------  Chemistries  No results for input(s): NA, K, CL, CO2, GLUCOSE, BUN, CREATININE, CALCIUM, MG, AST, ALT, ALKPHOS, BILITOT in the last 168 hours.  Invalid input(s): GFRCGP ------------------------------------------------------------------------------------------------------------------    --------------------------------------------------------------------------------------------------------------- Urine analysis: No results found for: COLORURINE, APPEARANCEUR, LABSPEC, PHURINE, GLUCOSEU, HGBUR, BILIRUBINUR, KETONESUR, PROTEINUR, UROBILINOGEN, NITRITE, LEUKOCYTESUR    Imaging Results:    No results found.    Assessment & Plan:    Active Problems:   Sepsis ([place])   1. Sepsis-secondary to intra-abdominal abscess, blood cultures x2.  Urology Dr. Junious Silk was consulted by ED physician.  Check lactic acid every 3 hours.  Will start vancomycin and Zosyn per pharmacy consultation. 2. Bladder cancer status post cystectomy, ileal conduit-urology has been consulted as above.  3.  Acute kidney injury-creatinine at [facility] was 1.68, patient's baseline creatinine as of 08/14/2017 was 0.75.  Likely prerenal.  Start gentle IV hydration with normal saline.  Follow BMP in a.m.   DVT Prophylaxis-   Lovenox   AM Labs Ordered, also please review Full Orders  Family Communication: Admission, patients condition and plan of care including tests being ordered have been discussed with the patient  who indicate understanding and agree with the plan and Code Status.  Code Status: Full code  Admission status:  inpatient  Time spent in minutes : 60 minutes   Oswald Hillock M.D on 10/04/2017 at 5:09 AM  Between 7am to 7pm - Pager - [phone]. After 7pm go to [email or web address] - password [facility]  [facility] - Office  [phone]

## 2017-10-04 NOTE — Progress Notes (Signed)
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  Pharmacy has been consulted for Vancomycin and Zosyn dosing.  Plan:  Vancomycin 1gm iv x1 ~2024 11/22 at OSH, then 1250mg  iv q24hr  Goal AUC = 400 - 500 for all indications, except meningitis (goal AUC > 500 and Cmin 15-20 mcg/mL)  Zosyn 3.375g IV q8h (4 hour infusion).  Height: 6' (182.9 cm) Weight: 252 lb 13.9 oz (114.7 kg) IBW/kg (Calculated) : 77.6  Temp (24hrs), Avg:101.2 F (38.4 C), Min:99.6 F (37.6 C), Max:102.8 F (39.3 C)  No results for input(s): WBC, CREATININE, LATICACIDVEN, VANCOTROUGH, VANCOPEAK, VANCORANDOM, GENTTROUGH, GENTPEAK, GENTRANDOM, TOBRATROUGH, TOBRAPEAK, TOBRARND, AMIKACINPEAK, AMIKACINTROU, AMIKACIN in the last 168 hours.  CrCl cannot be calculated (Patient's most recent lab result is older than the maximum 21 days allowed.).    Allergies  Allergen Reactions  . Demerol [Meperidine] Other (See Comments)    SEVERE NAUSEA    Antimicrobials this admission: Vancomycin 10/04/2017 >> Zosyn 10/04/2017 >>   Dose adjustments this admission: -  Microbiology results: pending  Thank you for allowing pharmacy to be a part of this patient's care.  Nani Skillern Crowford 10/04/2017 4:41 AM

## 2017-10-04 NOTE — Consult Note (Signed)
Consult: sepsis,  Requested by: Dr. Ivor Costa  History of Present Illness: 66 yo male s/p open cystectomy, extended pelvic lymphadenectomy, ileal conduit diversion with Dr. [name] (path T3a HG urothelial, nodes negative) and rectal repair and loop colostomy by Dr. Marcello Moores 08/08/2017. He was seen in office Oct 01, 2017 and his stents were removed with retraction of stoma noted on exam. Patient has been packing part of his wound that opened up. Patient developed abdominal pain for 2 days and presented to ED in [place], [place] ED. Pain in the ML upper abdomen.   Patient was found to have WBC 28, Cr 1.68, UA with many bacteria, 10-20 rbc, tntc wbc, 2+ LE, -N, lactic acid 1.8, procalcitonin 0.76, temperature 99.5, tachycardia, RR 20, oxygen saturation normal, Bp 140/72. CT A/P with IV contrast showed bilateral moderate hydronephrosis with symmetric enhancement of kidneys, increased enhancement of right collecting system urothelium, ileal conduit not distended, marked inflammation of RLQ mesentery with partially organized RLQ fluid collection of 5 x 6 cm (abscess vs urinoma) and delayed images were recommended. I reviewed all the images. By report blood cultures were obtained. Patient was started with Zosyn and vancomycin and given 3 L normal saline bolus and transferred to [facility].   At [facility], Vanc and Zosyn continued. He spiked a temp on arrival here but otherwise has been AFVSS. Currently T 99.6, hr 121. Repeat labs and blood and urine cx's pending now. Passing flatus/stool. Pt c/o abd pain.   Past Medical History:  Diagnosis Date  . Cancer [facility])    PROSTATE CANCER  . Complication of anesthesia    AFTER GB SURGERY PT FELT LIKE FLOOR AND BED VIBRATING--WOKE UP TO FIND HIMSELF STANDING UP AT BEDSIDE.  Marland Kitchen GERD (gastroesophageal reflux disease)   . H/O hiatal hernia   . RBBB    NO PROBLEMS   Past Surgical History:  Procedure Laterality Date  . APPENDECTOMY  1972  . CHOLECYSTECTOMY  1985  . CYSTOSCOPY WITH  RETROGRADE PYELOGRAM, URETEROSCOPY AND STENT PLACEMENT Right 06/10/2017   Procedure: CYSTOSCOPY WITH RIGHT URETEROSCOPY WITH RIGHT STENT PLACEMENT;  Surgeon: Raynelle Bring, MD;  [facility];  Service: Urology;  Laterality: Right;  . ROBOT ASSISTED LAPAROSCOPIC RADICAL PROSTATECTOMY  10/02/2012   Procedure: ROBOTIC ASSISTED LAPAROSCOPIC RADICAL PROSTATECTOMY LEVEL 2;  Surgeon: Dutch Gray, MD;  [facility];  Service: Urology;  Laterality: N/A;  . ROBOTIC ASSISTED LAPAROSCOPIC BLADDER DIVERTICULECTOMY N/A 08/08/2017   Procedure: XI ROBOTIC ASSISTED LAPAROSCOPIC RADICAL CYSTECTOMY COVERTED TO OPEN PELVIC LYMPHADNECTOMY BILATERAL AND ILEAL CONDUIT URINARY DIVERSION;  Surgeon: Raynelle Bring, MD;  Location[facility];  Service: Urology;  Laterality: N/A;  . TRANSURETHRAL RESECTION OF BLADDER TUMOR  06/10/2017   Procedure: TRANSURETHRAL RESECTION OF BLADDER TUMOR (TURBT);  Surgeon: Raynelle Bring, MD;  [facility];  Service: Urology;;  . XI ROBOT ABDOMINAL PERINEAL RESECTION N/A 08/08/2017   Procedure: REPAIR OF RECTAL TEAR POSSIBLE PARTIAL PROCTECTOMY, CREATION OF  OSTOMY;  Surgeon: Leighton Ruff, MD;  Location: [facility];  Service: General;  Laterality: N/A;    Home Medications:  Medications Prior to Admission  Medication Sig Dispense Refill Last Dose  . acetaminophen (TYLENOL) 500 MG tablet Take 1,000 mg by mouth daily as needed for mild pain. For pain   10/03/2017 at Unknown time  . docusate sodium (COLACE) 100 MG capsule Take 2 capsules (200 mg total) by mouth 2 (two) times daily. 120 capsule 11 10/03/2017 at Unknown time  . esomeprazole (NEXIUM) 20 MG capsule Take 20 mg by  mouth daily as needed (heartburn).   10/03/2017 at Unknown time  . HYDROcodone-acetaminophen (NORCO/VICODIN) 5-325 MG tablet Take 1-2 tablets by mouth every 6 (six) hours as needed for moderate pain (pain). 30 tablet 0 10/03/2017 at Unknown time   Allergies:  Allergies  Allergen Reactions  . Demerol [Meperidine] Other  (See Comments)    SEVERE NAUSEA    No family history on file. Social History:  reports that he has quit smoking. he has never used smokeless tobacco. He reports that he drinks alcohol. He reports that he does not use drugs.  ROS: A complete review of systems was performed.  All systems are negative except for pertinent findings as noted. Review of Systems  All other systems reviewed and are negative.    Physical Exam:  Vital signs in last 24 hours: Temp:  [99.6 F (37.6 C)-102.8 F (39.3 C)] 99.6 F (37.6 C) (11/23 0344) Pulse Rate:  [121-139] 121 (11/23 0344) Resp:  [18-22] 18 (11/23 0344) BP: (134-135)/(71-73) 134/71 (11/23 0220) SpO2:  [99 %] 99 % (11/23 0344) Weight:  [114.7 kg (252 lb 13.9 oz)] 114.7 kg (252 lb 13.9 oz) (11/23 0111)   General:  Alert and oriented, No acute distress HEENT: Normocephalic, atraumatic Cardiovascular: Regular rate and rhythm Lungs: Regular rate and effort Abdomen: Soft, diffuse tender across abdomen, non distended, no rebound, no abdominal masses, good bowel sounds, wound almost completely closed/healthy granulation, ileal conduit stoma and LUQ colostomy pink and viable, urine clear in bag  Back: No CVA tenderness Extremities: No edema Neurologic: Grossly intact  Laboratory Data:  No results found for this or any previous visit (from the past 24 hour(s)). No results found for this or any previous visit (from the past 240 hour(s)). Creatinine: No results for input(s): CREATININE in the last 168 hours.  Impression/Assessment/plan: 66 yo two months post-op open cystectomy, ileal conduit diversion, rectal repair and loop colostomy with ureteral stents pulled 10/01/2017:   -pelvic fluic collection, abd pain - will obtain non-con CT to check for contrast extravasation.Not certain if drainable. Will consult IR and gen surg.  -fever, sepsis - possible pyelonephritis after stent pull vs abscess/urinoma. Will pursue fluid collection as above.   --bilateral hydronephrosis - degree expected for ileal conduit, monitor UOP and Cr.   Festus Aloe 10/04/2017, 5:15 AM

## 2017-10-04 NOTE — Progress Notes (Signed)
Patient is unable to allow staff to assess his backside at this time. RN has attempted multiple times but patient is unable and unwilling to turn on his side even after PRN pain medication. Pain is manageable when pt is lying still, but pain is intolerable with any movement. Will pass to oncoming RN.

## 2017-10-04 NOTE — Progress Notes (Addendum)
General Surgery [facility]  Assessment & Plan: Status post cystectomy with ileal conduit (Dr. Dutch Gray), repair rectal injury with colostomy (Dr. Leighton Ruff)  Discussed with Dr. Junious Silk and CT scan findings reviewed  Will be available if general surgery concerns arise during admission  Will notify Dr. Marcello Moores of patient's admission  Appears to have significant urine leak after removal of stents 3 days ago - IR planning percutaneous drainage procedure today.        Earnstine Regal, MD, [facility]       Office: [phone]    Chief Complaint: Abd pain  Subjective: Patient in room, wife at bedside.  Notes abdominal pain.  No output from colostomy in 24 hours.  Objective: Vital signs in last 24 hours: Temp:  [98.4 F (36.9 C)-102.8 F (39.3 C)] 98.4 F (36.9 C) (11/23 0647) Pulse Rate:  [111-139] 111 (11/23 0647) Resp:  [18-22] 18 (11/23 0647) BP: (112-135)/(71-75) 112/75 (11/23 0647) SpO2:  [99 %] 99 % (11/23 0647) Weight:  [114.7 kg (252 lb 13.9 oz)] 114.7 kg (252 lb 13.9 oz) (11/23 0111) Last BM Date: 10/03/17  Intake/Output from previous day: 11/22 0701 - 11/23 0700 In: 176.7 [I.V.:126.7; IV Piggyback:50] Out: 300 [Urine:300] Intake/Output this shift: No intake/output data recorded.  Physical Exam: HEENT - sclerae clear, mucous membranes moist Abdomen - soft, obese; diffuse tenderness to palpation especially in upper abdomen; ileostomy RLQ, colostomy LLQ with small gas in bag Neuro - alert & oriented, no focal deficits  Lab Results:  Recent Labs    10/04/17 0522  WBC 18.4*  HGB 11.6*  HCT 36.6*  PLT 303   BMET Recent Labs    10/04/17 0522  NA 140  K 4.2  CL 112*  CO2 20*  GLUCOSE 118*  BUN 23*  CREATININE 1.66*  CALCIUM 8.2*   PT/INR No results for input(s): LABPROT, INR in the last 72 hours. Comprehensive Metabolic Panel:    Component Value Date/Time   NA 140 10/04/2017 0522   NA 142  08/14/2017 0510   K 4.2 10/04/2017 0522   K 3.6 08/14/2017 0510   CL 112 (H) 10/04/2017 0522   CL 111 08/14/2017 0510   CO2 20 (L) 10/04/2017 0522   CO2 26 08/14/2017 0510   BUN 23 (H) 10/04/2017 0522   BUN 10 08/14/2017 0510   CREATININE 1.66 (H) 10/04/2017 0522   CREATININE 0.75 08/14/2017 0510   GLUCOSE 118 (H) 10/04/2017 0522   GLUCOSE 114 (H) 08/14/2017 0510   CALCIUM 8.2 (L) 10/04/2017 0522   CALCIUM 8.1 (L) 08/14/2017 0510   AST 28 10/04/2017 0522   ALT 31 10/04/2017 0522   ALKPHOS 67 10/04/2017 0522   BILITOT 0.7 10/04/2017 0522   PROT 6.5 10/04/2017 0522   ALBUMIN 2.7 (L) 10/04/2017 0522    Studies/Results: Ct Abdomen Pelvis Wo Contrast  Result Date: 10/04/2017 CLINICAL DATA:  History of cystectomy with ileal conduit and urinary diversion. Status post ureteral stent removal with development of abdominal pain, low-grade fever and outside CT demonstrating abnormal fluid collections. EXAM: CT ABDOMEN AND PELVIS WITHOUT CONTRAST TECHNIQUE: Multidetector CT imaging of the abdomen and pelvis was performed following the standard protocol without IV contrast. COMPARISON:  05/24/2017 FINDINGS: Lower chest: Bibasilar atelectasis. Hepatobiliary: No focal liver abnormality is seen. Status post cholecystectomy. No biliary dilatation. Pancreas: Unremarkable. No pancreatic ductal dilatation or surrounding inflammatory changes. Spleen: Normal in size without focal abnormality. Adrenals/Urinary Tract: Excreted  contrast material is noted in both collecting systems of the kidneys and ureters. The kidneys do not demonstrate significant hydronephrosis. As the ureters are followed inferiorly, contrast is present in the ileal conduit as well as extravasated contrast immediately anterior to the conduit in the peritoneal cavity. There are also multiple additional high density fluid collections throughout the peritoneal cavity consistent with extravasated urine. At least 10 to 12 additional areas of high  attenuation fluid are present. Stomach/Bowel: Bowel shows no evidence of obstruction or ileus. No free air identified. Ileostomy in the right lower quadrant and colostomy in the left lower quadrant appear unremarkable. Vascular/Lymphatic: No enlarged lymph nodes identified. The abdominal aorta is normal in caliber. Reproductive: The bladder has been removed. The prostate gland also appears likely to have been removed. Other: Small right inguinal hernia contains fat. Musculoskeletal: No acute or significant osseous findings. IMPRESSION: Multiple high attenuation areas of free fluid throughout the peritoneal cavity consistent with extravasated urine. The highest density collection is just anterior to the ileal conduit. Electronically Signed   By: Aletta Edouard M.D.   On: 10/04/2017 08:52      Temica Righetti M 10/04/2017  Patient ID: Kristopher Thompson, DDS, male   DOB: 22-May-1951, 66 y.o.   MRN: [id number]

## 2017-10-04 NOTE — Progress Notes (Signed)
Urology MD Sonoita paged at [phone] per MD Niu's request to be made aware that patient has been admitted to [facility]. Awaiting response.

## 2017-10-04 NOTE — Progress Notes (Signed)
   Pt s/p drain. Aspirate sent for cx by IR. Pt doing well. Looks well, NAD. Drain output good. Clear fluid c/w urine leak. Continue to monitor. Regular diet.

## 2017-10-04 NOTE — Procedures (Signed)
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  Sample sent for culture. 10 Fr drain placed and attached to gravity bag.  Venetia Night. Kathlene Cote, M.D Pager:  [phone]

## 2017-10-04 NOTE — Progress Notes (Signed)
Initial Nutrition Assessment  DOCUMENTATION CODES:   Obesity unspecified, Non-severe (moderate) malnutrition in context of chronic illness  INTERVENTION:   Provide Premier Protein BID, each supplement provides 160kcal and 30g protein.  NUTRITION DIAGNOSIS:   Moderate Malnutrition related to chronic illness(bladder/prostate cancer) as evidenced by 6% weight loss in one month, energy intake < or equal to 50% for > or equal to 1 month.  GOAL:   Patient will meet greater than or equal to 90% of their needs  MONITOR:   PO intake, Supplement acceptance, Weight trends, Labs  REASON FOR ASSESSMENT:   Malnutrition Screening Tool    ASSESSMENT:   Pt with PMH significant for prostate cancer, bladder cancer s/p radical cystectomy and ileal conduit urinary diversion 11/17/2692 complicated by rectal injury s/p diverting colostomy. Presents this admission with complaints of abdominal pain for the two days. Admitted for sepsis and AKI.   Spoke with pt at bedside who reports having loss in appetite since bladder cancer diagnosis in September 2018. States he typically eats three small meal per day that consist of chicken nuggets, egg, and chicken tenders. Admits to having taste changes since colostomy sugery that contribute to his loss in appetite. Pt amendable to supplementation this hospital stay.   Pt reports a UBW of 265 lb, the last time being at that weight in September 2018. Records indicate pt weighed 268 lb 07/2017 and weighs 252 lb this stay. This shows a 6% weight loss in one month which is significant.   Nutrition-Focused physical exam completed.  Medications reviewed and include: colace, fentanyl, versed, NS @ 100 ml/hr, IV abx Labs reviewed: Creatinine 1.66 (H)   NUTRITION - FOCUSED PHYSICAL EXAM:    Most Recent Value  Orbital Region  No depletion  Upper Arm Region  No depletion  Thoracic and Lumbar Region  No depletion  Buccal Region  No depletion  Temple Region  No depletion   Clavicle Bone Region  No depletion  Clavicle and Acromion Bone Region  No depletion  Scapular Bone Region  No depletion  Dorsal Hand  No depletion  Patellar Region  No depletion  Anterior Thigh Region  No depletion  Posterior Calf Region  No depletion  Edema (RD Assessment)  None  Hair  Reviewed  Eyes  Reviewed  Mouth  Unable to assess  Skin  Reviewed  Nails  Reviewed       Diet Order:  Diet regular Room service appropriate? Yes; Fluid consistency: Thin  EDUCATION NEEDS:   Education needs have been addressed  Skin:  Skin Assessment: Reviewed RN Assessment  Last BM:  10/03/17  Height:   Ht Readings from Last 1 Encounters:  10/04/17 6' (1.829 m)    Weight:   Wt Readings from Last 1 Encounters:  10/04/17 252 lb 13.9 oz (114.7 kg)    Ideal Body Weight:  80.9 kg  BMI:  Body mass index is 34.29 kg/m.  Estimated Nutritional Needs:   Kcal:  2100-2300 kcal/day  Protein:  105-115 g/day  Fluid:  >2.1 L/day    Mariana Single RD, LDN Clinical Nutrition Pager # - [phone]

## 2017-10-04 NOTE — Progress Notes (Signed)
Kristopher Thompson  is a 66 y.o. male,.. With history of prostate cancer, bladder cancer status post radical cystectomy and ileal conduit urinary diversion on 01/04/3611, complicated by rectal injury which was repaired by general surgery status post diverting colostomy came to hospital with complaints of abdominal pain for past 2 days. 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 Urology and surgery consulted and on board.    Plan: Pain control.  Check labs in am.  Repeat lactic acid in am.  Wean him off the oxygen as appropriate.    Hosie Poisson MD [phone]

## 2017-10-04 NOTE — Progress Notes (Signed)
Patient received from [facility] in [place] via EMS transport. Pt A&O, c/o abdominal pain. Pain rated at a 3 at this time but per patient pain is "unbearable" with any movement and pt aldo c/o sudden severe sharp pain to lower mid/ right abdomen. Urostomy intact to right abdomen draining clear, yellow urine. Colostomy intact to left abdomen, no stool in bag. MD [place] paged to be made aware of T 101.1, RR 22, HR 139 sustaining, BP 135/73, O2 Sats 99% 2L/[place]. Awaiting MD to assess patient. Pt requesting ice and water- educated pt that no orders have been placed and water or ice cannot be given until MD places orders. Pt agreeable. MEWS 5- will continue to monitor closely.

## 2017-10-05 ENCOUNTER — Encounter (HOSPITAL_COMMUNITY): Payer: Self-pay

## 2017-10-05 LAB — COMPREHENSIVE METABOLIC PANEL
ALBUMIN: 2.1 g/dL — AB (ref 3.5–5.0)
ALT: 21 U/L (ref 17–63)
ANION GAP: 7 (ref 5–15)
AST: 16 U/L (ref 15–41)
Alkaline Phosphatase: 61 U/L (ref 38–126)
BILIRUBIN TOTAL: 0.8 mg/dL (ref 0.3–1.2)
BUN: 19 mg/dL (ref 6–20)
CO2: 23 mmol/L (ref 22–32)
Calcium: 7.8 mg/dL — ABNORMAL LOW (ref 8.9–10.3)
Chloride: 107 mmol/L (ref 101–111)
Creatinine, Ser: 0.99 mg/dL (ref 0.61–1.24)
GFR calc non Af Amer: >60 mL/min (ref >60)
GLUCOSE: 107 mg/dL — AB (ref 65–99)
POTASSIUM: 3.5 mmol/L (ref 3.5–5.1)
SODIUM: 137 mmol/L (ref 135–145)
TOTAL PROTEIN: 5.6 g/dL — AB (ref 6.5–8.1)

## 2017-10-05 LAB — BLOOD CULTURE ID PANEL (REFLEXED)
ACINETOBACTER BAUMANNII: NOT DETECTED
CANDIDA KRUSEI: NOT DETECTED
CARBAPENEM RESISTANCE: NOT DETECTED
Candida albicans: NOT DETECTED
Candida glabrata: NOT DETECTED
Candida parapsilosis: NOT DETECTED
Candida tropicalis: NOT DETECTED
ENTEROBACTERIACEAE SPECIES: DETECTED — AB
ENTEROCOCCUS SPECIES: NOT DETECTED
Enterobacter cloacae complex: NOT DETECTED
Escherichia coli: NOT DETECTED
Haemophilus influenzae: NOT DETECTED
KLEBSIELLA OXYTOCA: NOT DETECTED
Klebsiella pneumoniae: DETECTED — AB
LISTERIA MONOCYTOGENES: NOT DETECTED
NEISSERIA MENINGITIDIS: NOT DETECTED
PSEUDOMONAS AERUGINOSA: NOT DETECTED
Proteus species: NOT DETECTED
SERRATIA MARCESCENS: NOT DETECTED
STAPHYLOCOCCUS SPECIES: NOT DETECTED
STREPTOCOCCUS AGALACTIAE: NOT DETECTED
STREPTOCOCCUS PNEUMONIAE: NOT DETECTED
Staphylococcus aureus (BCID): NOT DETECTED
Streptococcus pyogenes: NOT DETECTED
Streptococcus species: NOT DETECTED

## 2017-10-05 LAB — CBC
HEMATOCRIT: 30.2 % — AB (ref 39.0–52.0)
Hemoglobin: 9.4 g/dL — ABNORMAL LOW (ref 13.0–17.0)
MCH: 25.3 pg — ABNORMAL LOW (ref 26.0–34.0)
MCHC: 31.1 g/dL (ref 30.0–36.0)
MCV: 81.2 fL (ref 78.0–100.0)
Platelets: 252 10*3/uL (ref 150–400)
RBC: 3.72 MIL/uL — ABNORMAL LOW (ref 4.22–5.81)
RDW: 15.2 % (ref 11.5–15.5)
WBC: 12.9 10*3/uL — ABNORMAL HIGH (ref 4.0–10.5)

## 2017-10-05 LAB — URINE CULTURE

## 2017-10-05 LAB — LACTIC ACID, PLASMA: LACTIC ACID, VENOUS: 1.3 mmol/L (ref 0.5–1.9)

## 2017-10-05 LAB — PROCALCITONIN: Procalcitonin: 5.36 ng/mL

## 2017-10-05 LAB — CREATININE, FLUID (PLEURAL, PERITONEAL, JP DRAINAGE)

## 2017-10-05 MED ORDER — ALUM & MAG HYDROXIDE-SIMETH 200-200-20 MG/5ML PO SUSP
30.0000 mL | Freq: Once | ORAL | Status: AC
  Administered 2017-10-05: 30 mL via ORAL
  Filled 2017-10-05: qty 30

## 2017-10-05 MED ORDER — VANCOMYCIN HCL IN DEXTROSE 1-5 GM/200ML-% IV SOLN
1000.0000 mg | Freq: Two times a day (BID) | INTRAVENOUS | Status: DC
  Administered 2017-10-05 – 2017-10-07 (×4): 1000 mg via INTRAVENOUS
  Filled 2017-10-05 (×4): qty 200

## 2017-10-05 MED ORDER — OXYCODONE-ACETAMINOPHEN 5-325 MG PO TABS
1.0000 | ORAL_TABLET | ORAL | Status: DC | PRN
  Administered 2017-10-05 (×2): 2 via ORAL
  Filled 2017-10-05 (×2): qty 2

## 2017-10-05 MED ORDER — IBUPROFEN 200 MG PO TABS
400.0000 mg | ORAL_TABLET | Freq: Once | ORAL | Status: AC
  Administered 2017-10-05: 400 mg via ORAL
  Filled 2017-10-05: qty 2

## 2017-10-05 NOTE — Progress Notes (Signed)
PROGRESS NOTE    Kristopher Thompson, DDS  OFB:[id number] DOB: [birth year] DOA: 10/04/2017 PCP: Lorene Dy, MD   Brief Narrative: [name] a66 y.o.male,..With history of prostate cancer, bladder cancer status post radical cystectomy and ileal conduit urinary diversion on 7/82/4235, complicated by rectal injury which was repaired by general surgery status post diverting colostomy came to hospital with complaints of abdominal pain for past 2 days. 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 Urology and surgery consulted and on board.     Assessment & Plan:   Active Problems:   Sepsis ([place])   History of bladder cancer status post radical cystectomy ileal conduit, urinary diversion on 3/61 complicated by a rectal injury status post diverting colostomy.  Removal of ureteral stents on 11/20. Status post percutaneous percutaneous catheter drainage of urinoma on 11/23. The drain has clear urine. Urology and IR on board. Currently on broad-spectrum antibiotics and cultures are pending. Pain control as needed, ambulate out of bed.   Gram-negative rods bacteremia Currently on IV vancomycin and Zosyn, resume broad-spectrum antibiotics.  Await identification and sensitivities.  Febrile overnight 202 Fahrenheit and leukocytosis is improving.  Lactic acid is improving.   Anemia of chronic disease Hemoglobin around 9.   Acute renal failure Probably secondary to sepsis and dehydration.  After appropriate hydration with normal saline creatinine is back at baseline.      DVT prophylaxis: Lovenox Code Status: Full code Family Communication: Family at bedside Disposition Plan: Pending further evaluation by urology  Consultants:   IR  Urology  Surgery   Procedures: Percutaneous drain placement by IR  Antimicrobials: IV vancomycin and Zosyn since admission  Subjective: Abdominal pain, mild nausea  Objective: Vitals:   10/04/17 2211  10/04/17 2255 10/05/17 0637 10/05/17 0808  BP:  (!) 96/59 110/62 92/60  Pulse:  (!) 107 (!) 102 (!) 104  Resp:  20 20 18   Temp: (!) 102.4 F (39.1 C) 99.2 F (37.3 C) 99.2 F (37.3 C) 99.7 F (37.6 C)  TempSrc: Oral Oral Oral Oral  SpO2:  99% 100% 99%  Weight:      Height:        Intake/Output Summary (Last 24 hours) at 10/05/2017 1426 Last data filed at 10/05/2017 1100 Gross per 24 hour  Intake 2505.01 ml  Output 2800 ml  Net -294.99 ml   Filed Weights   10/04/17 0111  Weight: 114.7 kg (252 lb 13.9 oz)    Examination:  General exam: In mild distress from abdominal pain Respiratory system: Clear to auscultation. Respiratory effort normal. Cardiovascular system: S1 & S2 heard, RRR.  No pedal edema. Gastrointestinal system: Soft nontender nondistended bowel sounds are heard urostomy and colostomy present urostomy draining clear yellow urine.  Right lower quadrant percutaneous drain in place. Central nervous system: Alert and oriented. No focal neurological deficits. Extremities: Symmetric 5 x 5 power. Skin: No rashes, lesions or ulcers Psychiatry: Mood & affect appropriate.     Data Reviewed: I have personally reviewed following labs and imaging studies  CBC: Recent Labs  Lab 10/04/17 0522 10/05/17 0602  WBC 18.4* 12.9*  HGB 11.6* 9.4*  HCT 36.6* 30.2*  MCV 79.7 81.2  PLT 303 443   Basic Metabolic Panel: Recent Labs  Lab 10/04/17 0522 10/05/17 0602  NA 140 137  K 4.2 3.5  CL 112* 107  CO2 20* 23  GLUCOSE 118* 107*  BUN 23* 19  CREATININE 1.66* 0.99  CALCIUM 8.2* 7.8*   GFR: Estimated Creatinine Clearance: 97.2 mL/min (by  C-G formula based on SCr of 0.99 mg/dL). Liver Function Tests: Recent Labs  Lab 10/04/17 0522 10/05/17 0602  AST 28 16  ALT 31 21  ALKPHOS 67 61  BILITOT 0.7 0.8  PROT 6.5 5.6*  ALBUMIN 2.7* 2.1*   No results for input(s): LIPASE, AMYLASE in the last 168 hours. No results for input(s): AMMONIA in the last 168  hours. Coagulation Profile: No results for input(s): INR, PROTIME in the last 168 hours. Cardiac Enzymes: No results for input(s): CKTOTAL, CKMB, CKMBINDEX, TROPONINI in the last 168 hours. BNP (last 3 results) No results for input(s): PROBNP in the last 8760 hours. HbA1C: No results for input(s): HGBA1C in the last 72 hours. CBG: No results for input(s): GLUCAP in the last 168 hours. Lipid Profile: No results for input(s): CHOL, HDL, LDLCALC, TRIG, CHOLHDL, LDLDIRECT in the last 72 hours. Thyroid Function Tests: No results for input(s): TSH, T4TOTAL, FREET4, T3FREE, THYROIDAB in the last 72 hours. Anemia Panel: No results for input(s): VITAMINB12, FOLATE, FERRITIN, TIBC, IRON, RETICCTPCT in the last 72 hours. Sepsis Labs: Recent Labs  Lab 10/04/17 0522 10/04/17 0813 10/05/17 0602  PROCALCITON 4.58  --  5.36  LATICACIDVEN 1.7 2.0* 1.3    Recent Results (from the past 240 hour(s))  Culture, blood (Routine X 2) w Reflex to ID Panel     Status: None (Preliminary result)   Collection Time: 10/04/17  5:33 AM  Result Value Ref Range Status   Specimen Description BLOOD LEFT ANTECUBITAL  Final   Special Requests   Final    BOTTLES DRAWN AEROBIC AND ANAEROBIC Blood Culture adequate volume   Culture  Setup Time   Final    GRAM NEGATIVE RODS AEROBIC BOTTLE ONLY CRITICAL RESULT CALLED TO, READ BACK BY AND VERIFIED WITH: J. GRIMSLEY, RPHARMD ([place]) AT 0308 ON 10/05/17 BY C. JESSUP, MLT.    Culture   Final    GRAM NEGATIVE RODS CULTURE REINCUBATED FOR BETTER GROWTH Performed at [facility], [place] [address], [place], [place] [address]    Report Status PENDING  Incomplete  Blood Culture ID Panel (Reflexed)     Status: Abnormal   Collection Time: 10/04/17  5:33 AM  Result Value Ref Range Status   Enterococcus species NOT DETECTED NOT DETECTED Final   Listeria monocytogenes NOT DETECTED NOT DETECTED Final   Staphylococcus species NOT DETECTED NOT DETECTED Final   Staphylococcus  aureus NOT DETECTED NOT DETECTED Final   Streptococcus species NOT DETECTED NOT DETECTED Final   Streptococcus agalactiae NOT DETECTED NOT DETECTED Final   Streptococcus pneumoniae NOT DETECTED NOT DETECTED Final   Streptococcus pyogenes NOT DETECTED NOT DETECTED Final   Acinetobacter baumannii NOT DETECTED NOT DETECTED Final   Enterobacteriaceae species DETECTED (A) NOT DETECTED Final    Comment: Enterobacteriaceae represent a large family of gram-negative bacteria, not a single organism. CRITICAL RESULT CALLED TO, READ BACK BY AND VERIFIED WITH: J. GRIMSLEY, RPHARMD ([place]) AT 0308 ON 10/05/17 BY C. JESSUP, MLT.    Enterobacter cloacae complex NOT DETECTED NOT DETECTED Final   Escherichia coli NOT DETECTED NOT DETECTED Final   Klebsiella oxytoca NOT DETECTED NOT DETECTED Final   Klebsiella pneumoniae DETECTED (A) NOT DETECTED Final    Comment: CRITICAL RESULT CALLED TO, READ BACK BY AND VERIFIED WITH: J. GRIMSLEY, RPHARMD ([place]) AT 0308 ON 10/05/17 BY C. JESSUP, MLT.    Proteus species NOT DETECTED NOT DETECTED Final   Serratia marcescens NOT DETECTED NOT DETECTED Final   Carbapenem resistance NOT DETECTED  NOT DETECTED Final   Haemophilus influenzae NOT DETECTED NOT DETECTED Final   Neisseria meningitidis NOT DETECTED NOT DETECTED Final   Pseudomonas aeruginosa NOT DETECTED NOT DETECTED Final   Candida albicans NOT DETECTED NOT DETECTED Final   Candida glabrata NOT DETECTED NOT DETECTED Final   Candida krusei NOT DETECTED NOT DETECTED Final   Candida parapsilosis NOT DETECTED NOT DETECTED Final   Candida tropicalis NOT DETECTED NOT DETECTED Final    Comment: Performed at [facility], [place] [address] [place], [place] [address]  Culture, Urine     Status: Abnormal   Collection Time: 10/04/17  5:47 AM  Result Value Ref Range Status   Specimen Description URINE, RANDOM  Final   Special Requests NONE  Final   Culture MULTIPLE SPECIES PRESENT, SUGGEST RECOLLECTION (A)  Final    Report Status 10/05/2017 FINAL  Final  Aerobic/Anaerobic Culture (surgical/deep wound)     Status: None (Preliminary result)   Collection Time: 10/04/17 12:19 PM  Result Value Ref Range Status   Specimen Description ABSCESS  Final   Special Requests Normal  Final   Gram Stain   Final    ABUNDANT WBC PRESENT,BOTH PMN AND MONONUCLEAR ABUNDANT GRAM VARIABLE ROD FEW GRAM POSITIVE COCCI IN PAIRS IN CHAINS    Culture   Final    CULTURE REINCUBATED FOR BETTER GROWTH Performed at [facility], [place] [address], [place], [place] [address]    Report Status PENDING  Incomplete         Radiology Studies: Ct Abdomen Pelvis Wo Contrast  Result Date: 10/04/2017 CLINICAL DATA:  History of cystectomy with ileal conduit and urinary diversion. Status post ureteral stent removal with development of abdominal pain, low-grade fever and outside CT demonstrating abnormal fluid collections. EXAM: CT ABDOMEN AND PELVIS WITHOUT CONTRAST TECHNIQUE: Multidetector CT imaging of the abdomen and pelvis was performed following the standard protocol without IV contrast. COMPARISON:  05/24/2017 FINDINGS: Lower chest: Bibasilar atelectasis. Hepatobiliary: No focal liver abnormality is seen. Status post cholecystectomy. No biliary dilatation. Pancreas: Unremarkable. No pancreatic ductal dilatation or surrounding inflammatory changes. Spleen: Normal in size without focal abnormality. Adrenals/Urinary Tract: Excreted contrast material is noted in both collecting systems of the kidneys and ureters. The kidneys do not demonstrate significant hydronephrosis. As the ureters are followed inferiorly, contrast is present in the ileal conduit as well as extravasated contrast immediately anterior to the conduit in the peritoneal cavity. There are also multiple additional high density fluid collections throughout the peritoneal cavity consistent with extravasated urine. At least 10 to 12 additional areas of high attenuation fluid are  present. Stomach/Bowel: Bowel shows no evidence of obstruction or ileus. No free air identified. Ileostomy in the right lower quadrant and colostomy in the left lower quadrant appear unremarkable. Vascular/Lymphatic: No enlarged lymph nodes identified. The abdominal aorta is normal in caliber. Reproductive: The bladder has been removed. The prostate gland also appears likely to have been removed. Other: Small right inguinal hernia contains fat. Musculoskeletal: No acute or significant osseous findings. IMPRESSION: Multiple high attenuation areas of free fluid throughout the peritoneal cavity consistent with extravasated urine. The highest density collection is just anterior to the ileal conduit. Electronically Signed   By: Aletta Edouard M.D.   On: 10/04/2017 08:52   Ct Image Guided Drainage By Percutaneous Catheter  Result Date: 10/04/2017 CLINICAL DATA:  Status post prior cystoprostatectomy and ileal conduit diversion for bladder carcinoma. 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 EXAM: CT GUIDED CATHETER DRAINAGE OF PERITONEAL URINOMA ANESTHESIA/SEDATION: 2.0 mg IV Versed 100 mcg IV Fentanyl Total Moderate Sedation Time:  22 minutes The patient's level of consciousness and physiologic status were continuously monitored during the procedure by Radiology nursing. PROCEDURE: The procedure, risks, benefits, and alternatives were explained to the patient. Questions regarding the procedure were encouraged and answered. The patient understands and consents to the procedure. A time out was performed prior to initiating the procedure. The right anterior abdominal wall was prepped with chlorhexidine in a sterile fashion, and a sterile drape was applied covering the operative field. A sterile gown and sterile gloves were used for the procedure. Local anesthesia was provided with 1% Lidocaine. CT was performed through the  lower abdomen and upper pelvis in a supine position. 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 Aspiration of fluid was performed through the needle. A guidewire was then advanced into the collection. The tract was dilated and a 10 French percutaneous drainage catheter advanced. The catheter was formed and connected to a gravity drainage bag. A urine sample was sent for culture analysis. The catheter was secured at skin with a Prolene retention suture. COMPLICATIONS: None FINDINGS: The highest density pocket of extravasated urine containing contrast material lying anterior to the ileal conduit was targeted. There was return of turbid urine which was sent for culture analysis. After placement of the 10 French drainage catheter and connection to a gravity drainage bag, there was prompt return of urine. 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 Urine return was turbid and a sample sent for culture analysis. A 10 French drainage catheter was placed and connected to gravity bag drainage. Electronically Signed   By: Aletta Edouard M.D.   On: 10/04/2017 15:06        Scheduled Meds: . docusate sodium  200 mg Oral BID  . enoxaparin (LOVENOX) injection  40 mg Subcutaneous Q24H  . pantoprazole  40 mg Oral Daily  . protein supplement shake  11 oz Oral BID BM   Continuous Infusions: . sodium chloride 100 mL/hr at 10/05/17 0514  . piperacillin-tazobactam (ZOSYN)  IV 3.375 g (10/05/17 1357)  . vancomycin       LOS: 1 day    Time spent: 35 minutes    Hosie Poisson, MD [facility] Pager [phone]  If 7PM-7AM, please contact night-coverage [email or web address] Password [email or web address] 10/05/2017, 2:26 PM

## 2017-10-05 NOTE — Progress Notes (Signed)
PHARMACY - PHYSICIAN COMMUNICATION CRITICAL VALUE ALERT - BLOOD CULTURE IDENTIFICATION (BCID)  Results for orders placed or performed during the hospital encounter of 10/04/17  Blood Culture ID Panel (Reflexed) (Collected: 10/04/2017  5:33 AM)  Result Value Ref Range   Enterococcus species NOT DETECTED NOT DETECTED   Listeria monocytogenes NOT DETECTED NOT DETECTED   Staphylococcus species NOT DETECTED NOT DETECTED   Staphylococcus aureus NOT DETECTED NOT DETECTED   Streptococcus species NOT DETECTED NOT DETECTED   Streptococcus agalactiae NOT DETECTED NOT DETECTED   Streptococcus pneumoniae NOT DETECTED NOT DETECTED   Streptococcus pyogenes NOT DETECTED NOT DETECTED   Acinetobacter baumannii NOT DETECTED NOT DETECTED   Enterobacteriaceae species DETECTED (A) NOT DETECTED   Enterobacter cloacae complex NOT DETECTED NOT DETECTED   Escherichia coli NOT DETECTED NOT DETECTED   Klebsiella oxytoca NOT DETECTED NOT DETECTED   Klebsiella pneumoniae DETECTED (A) NOT DETECTED   Proteus species NOT DETECTED NOT DETECTED   Serratia marcescens NOT DETECTED NOT DETECTED   Carbapenem resistance NOT DETECTED NOT DETECTED   Haemophilus influenzae NOT DETECTED NOT DETECTED   Neisseria meningitidis NOT DETECTED NOT DETECTED   Pseudomonas aeruginosa NOT DETECTED NOT DETECTED   Candida albicans NOT DETECTED NOT DETECTED   Candida glabrata NOT DETECTED NOT DETECTED   Candida krusei NOT DETECTED NOT DETECTED   Candida parapsilosis NOT DETECTED NOT DETECTED   Candida tropicalis NOT DETECTED NOT DETECTED    Name of physician (or Provider) Contacted: X. Blount, mid-level night coverage.  Changes to prescribed antibiotics required: Per rapid ID antibiotic algorithm, would change to Ceftriaxone 2gm iv q24hr based on current BCID.  However, I suggested and Blount agreed to have rounding provider address narrowing antibiotics in AM.  Tyler Deis, Shea Stakes [place] 10/05/2017  3:29 AM

## 2017-10-05 NOTE — Progress Notes (Signed)
Referring Physician(s): Dr Karleen Hampshire  Supervising Physician: Corrie Mckusick  Patient Status:  [facility] - In-pt  Chief Complaint:  Pelvic collection drain placed 11/23   Subjective:  prostate and bladder cancer (s/p ofdiverting colostomy in place for rectal repair 9/27/2018andS/P ileal conduit),   Drain placed for pelvic collection 11/23 Doing well Draining well Feels some better today   Allergies: Demerol [meperidine]  Medications: Prior to Admission medications   Medication Sig Start Date End Date Taking? Authorizing Provider  acetaminophen (TYLENOL) 500 MG tablet Take 1,000 mg by mouth daily as needed for mild pain. For pain   Yes [provider]  docusate sodium (COLACE) 100 MG capsule Take 2 capsules (200 mg total) by mouth 2 (two) times daily. 08/14/17 08/14/18 Yes McCormickLenna Sciara, MD  esomeprazole (NEXIUM) 20 MG capsule Take 20 mg by mouth daily as needed (heartburn).   Yes [provider]  HYDROcodone-acetaminophen (NORCO/VICODIN) 5-325 MG tablet Take 1-2 tablets by mouth every 6 (six) hours as needed for moderate pain (pain). 08/14/17  Yes Stasia Cavalier, MD     Vital Signs: BP 92/60 (BP Location: Right Arm)   Pulse (!) 104   Temp 99.7 F (37.6 C) (Oral)   Resp 18   Ht 6' (1.829 m)   Wt 252 lb 13.9 oz (114.7 kg)   SpO2 99%   BMI 34.29 kg/m   Physical Exam  Constitutional: He is oriented to person, place, and time.  Abdominal: Soft. Bowel sounds are normal.  Neurological: He is alert and oriented to person, place, and time.  Skin: Skin is dry.  Site is cle and dry Sl tender OP cloudy yellow urine Great amounts  Nursing note and vitals reviewed. Cx pending  Imaging: Ct Abdomen Pelvis Wo Contrast  Result Date: 10/04/2017 CLINICAL DATA:  History of cystectomy with ileal conduit and urinary diversion. Status post ureteral stent removal with development of abdominal pain, low-grade fever and outside CT demonstrating abnormal  fluid collections. EXAM: CT ABDOMEN AND PELVIS WITHOUT CONTRAST TECHNIQUE: Multidetector CT imaging of the abdomen and pelvis was performed following the standard protocol without IV contrast. COMPARISON:  05/24/2017 FINDINGS: Lower chest: Bibasilar atelectasis. Hepatobiliary: No focal liver abnormality is seen. Status post cholecystectomy. No biliary dilatation. Pancreas: Unremarkable. No pancreatic ductal dilatation or surrounding inflammatory changes. Spleen: Normal in size without focal abnormality. Adrenals/Urinary Tract: Excreted contrast material is noted in both collecting systems of the kidneys and ureters. The kidneys do not demonstrate significant hydronephrosis. As the ureters are followed inferiorly, contrast is present in the ileal conduit as well as extravasated contrast immediately anterior to the conduit in the peritoneal cavity. There are also multiple additional high density fluid collections throughout the peritoneal cavity consistent with extravasated urine. At least 10 to 12 additional areas of high attenuation fluid are present. Stomach/Bowel: Bowel shows no evidence of obstruction or ileus. No free air identified. Ileostomy in the right lower quadrant and colostomy in the left lower quadrant appear unremarkable. Vascular/Lymphatic: No enlarged lymph nodes identified. The abdominal aorta is normal in caliber. Reproductive: The bladder has been removed. The prostate gland also appears likely to have been removed. Other: Small right inguinal hernia contains fat. Musculoskeletal: No acute or significant osseous findings. IMPRESSION: Multiple high attenuation areas of free fluid throughout the peritoneal cavity consistent with extravasated urine. The highest density collection is just anterior to the ileal conduit. Electronically Signed   By: Aletta Edouard M.D.   On: 10/04/2017 08:52   Ct Image Guided  Drainage By Percutaneous Catheter  Result Date: 10/04/2017 CLINICAL DATA:  Status post  prior cystoprostatectomy and ileal conduit diversion for bladder carcinoma. 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 EXAM: CT GUIDED CATHETER DRAINAGE OF PERITONEAL URINOMA ANESTHESIA/SEDATION: 2.0 mg IV Versed 100 mcg IV Fentanyl Total Moderate Sedation Time:  22 minutes The patient's level of consciousness and physiologic status were continuously monitored during the procedure by Radiology nursing. PROCEDURE: The procedure, risks, benefits, and alternatives were explained to the patient. Questions regarding the procedure were encouraged and answered. The patient understands and consents to the procedure. A time out was performed prior to initiating the procedure. The right anterior abdominal wall was prepped with chlorhexidine in a sterile fashion, and a sterile drape was applied covering the operative field. A sterile gown and sterile gloves were used for the procedure. Local anesthesia was provided with 1% Lidocaine. CT was performed through the lower abdomen and upper pelvis in a supine position. 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 Aspiration of fluid was performed through the needle. A guidewire was then advanced into the collection. The tract was dilated and a 10 French percutaneous drainage catheter advanced. The catheter was formed and connected to a gravity drainage bag. A urine sample was sent for culture analysis. The catheter was secured at skin with a Prolene retention suture. COMPLICATIONS: None FINDINGS: The highest density pocket of extravasated urine containing contrast material lying anterior to the ileal conduit was targeted. There was return of turbid urine which was sent for culture analysis. After placement of the 10 French drainage catheter and connection to a gravity drainage bag, there was prompt return of urine. CT to  confirm drain position shows near complete decompression of the urinoma. 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 Urine return was turbid and a sample sent for culture analysis. A 10 French drainage catheter was placed and connected to gravity bag drainage. Electronically Signed   By: Aletta Edouard M.D.   On: 10/04/2017 15:06   Dg Outside Films Chest  Result Date: 10/04/2017 This examination belongs to an outside facility and is stored here for comparison purposes only.  Contact the originating outside institution for any associated report or interpretation.  Ct Outside Films Body  Result Date: 10/04/2017 This examination belongs to an outside facility and is stored here for comparison purposes only.  Contact the originating outside institution for any associated report or interpretation.   Labs:  CBC: Recent Labs    08/13/17 0520 08/14/17 0510 10/04/17 0522 10/05/17 0602  WBC 9.8 10.3 18.4* 12.9*  HGB 9.9* 9.3* 11.6* 9.4*  HCT 28.0* 27.2* 36.6* 30.2*  PLT 120* 215 303 252    COAGS: No results for input(s): INR, APTT in the last 8760 hours.  BMP: Recent Labs    08/13/17 0520 08/14/17 0510 10/04/17 0522 10/05/17 0602  NA 139 142 140 137  K 3.7 3.6 4.2 3.5  CL 109 111 112* 107  CO2 25 26 20* 23  GLUCOSE 116* 114* 118* 107*  BUN 7 10 23* 19  CALCIUM 7.8* 8.1* 8.2* 7.8*  CREATININE 0.77 0.75 1.66* 0.99  GFRNONAA >60 >60 42* >60  GFRAA >60 >60 48* >60    LIVER FUNCTION TESTS: Recent Labs    10/04/17 0522 10/05/17 0602  BILITOT 0.7 0.8  AST  28 16  ALT 31 21  ALKPHOS 67 61  PROT 6.5 5.6*  ALBUMIN 2.7* 2.1*    Assessment and Plan:  Urinoma Drain placed 11/23 Will follow  Electronically Signed: Juddson Cobern A, PA-C 10/05/2017, 10:05 AM   I spent a total of 15 Minutes at the the patient's bedside AND on the patient's hospital floor or unit, greater than 50% of which was counseling/coordinating  care for urinoma drain

## 2017-10-05 NOTE — Progress Notes (Signed)
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  Vancomycin and zosyn started on admission for infection.   - 11/23: percutaneous catheter drainage of urinoma  Today, 10/05/2017: - day #2 abx - Tmax 102.4, wbc down 12.9 -  scr down 0.99 (crcl~75 N), PCT 5.36 - 1/2 bcx with GNR, BCID showed klebsiella pneum.  Plan: - adjust vancomycin dose to 1000 mg IV q12h for improving renal function (for est AUC 459- goal AUC 400-500) - zosyn 3.375 gm IV q8h (infuse over 4 hrs)  - with klebsiella in the blood, consider de-escalating abx to ceftriaxone 2gm IV q24h and add flagyl if anaerobic coverage is desired.  _________________________________________  Height: 6' (182.9 cm) Weight: 252 lb 13.9 oz (114.7 kg) IBW/kg (Calculated) : 77.6  Temp (24hrs), Avg:100.1 F (37.8 C), Min:98.4 F (36.9 C), Max:102.4 F (39.1 C)  Recent Labs  Lab 10/04/17 0522 10/04/17 0813 10/05/17 0602  WBC 18.4*  --  12.9*  CREATININE 1.66*  --  0.99  LATICACIDVEN 1.7 2.0* 1.3    Estimated Creatinine Clearance: 97.2 mL/min (by C-G formula based on SCr of 0.99 mg/dL).    Allergies  Allergen Reactions  . Demerol [Meperidine] Other (See Comments)    SEVERE NAUSEA    Antimicrobials this admission:  11/23 Vancomycin  >> 11/23 Zosyn  >>   Dose adjustments this admission:  --  Microbiology results:  11/23 BCx x2: 1/2 GNR (BCID= klebsiella pneum) 11/23 UCx: multi species FINAL 11/23 abscess: abundant Gram variable rods, few GPC in pairs and chains  Thank you for allowing pharmacy to be a part of this patient's care.  Lynelle Doctor 10/05/2017 9:51 AM

## 2017-10-05 NOTE — Progress Notes (Signed)
Subjective: Feeling weak this morning, but much better compared to the past 24 hours.  His diffuse abdominal pain is improving and he denies N/V/F/C. Tolerated regular food this morning for breakfast.     Objective: Vital signs in last 24 hours: Temp:  [98.4 F (36.9 C)-102.4 F (39.1 C)] 99.7 F (37.6 C) (11/24 0808) Pulse Rate:  [102-120] 104 (11/24 0808) Resp:  [18-28] 18 (11/24 0808) BP: (92-131)/(59-78) 92/60 (11/24 0808) SpO2:  [67 %-100 %] 99 % (11/24 0808)  Intake/Output from previous day: 11/23 0701 - 11/24 0700 In: 3706.7 [P.O.:480; I.V.:2576.7; IV Piggyback:650] Out: 3450 [Urine:1800; Drains:1650]  Intake/Output this shift: Total I/O In: 240 [P.O.:240] Out: 950 [Urine:600; Drains:350]  Physical Exam:  General: Alert and oriented CV: RRR, palpable distal pulses Lungs: CTAB, equal chest rise Abdomen: Soft, NTND, no rebound or guarding.  Urostomy and colostomy are viable.  Urostomy is draining clear-yellow urine.  Small amount of stool in colostomy bag.  RLQ perc drain in place with clear-yellow urine output (similar in appearance as his urostomy output) Ext: NT, No erythema  Lab Results: Recent Labs    10/04/17 0522 10/05/17 0602  HGB 11.6* 9.4*  HCT 36.6* 30.2*   BMET Recent Labs    10/04/17 0522 10/05/17 0602  NA 140 137  K 4.2 3.5  CL 112* 107  CO2 20* 23  GLUCOSE 118* 107*  BUN 23* 19  CREATININE 1.66* 0.99  CALCIUM 8.2* 7.8*     Studies/Results: Ct Abdomen Pelvis Wo Contrast  Result Date: 10/04/2017 CLINICAL DATA:  History of cystectomy with ileal conduit and urinary diversion. Status post ureteral stent removal with development of abdominal pain, low-grade fever and outside CT demonstrating abnormal fluid collections. EXAM: CT ABDOMEN AND PELVIS WITHOUT CONTRAST TECHNIQUE: Multidetector CT imaging of the abdomen and pelvis was performed following the standard protocol without IV contrast. COMPARISON:  05/24/2017 FINDINGS: Lower chest:  Bibasilar atelectasis. Hepatobiliary: No focal liver abnormality is seen. Status post cholecystectomy. No biliary dilatation. Pancreas: Unremarkable. No pancreatic ductal dilatation or surrounding inflammatory changes. Spleen: Normal in size without focal abnormality. Adrenals/Urinary Tract: Excreted contrast material is noted in both collecting systems of the kidneys and ureters. The kidneys do not demonstrate significant hydronephrosis. As the ureters are followed inferiorly, contrast is present in the ileal conduit as well as extravasated contrast immediately anterior to the conduit in the peritoneal cavity. There are also multiple additional high density fluid collections throughout the peritoneal cavity consistent with extravasated urine. At least 10 to 12 additional areas of high attenuation fluid are present. Stomach/Bowel: Bowel shows no evidence of obstruction or ileus. No free air identified. Ileostomy in the right lower quadrant and colostomy in the left lower quadrant appear unremarkable. Vascular/Lymphatic: No enlarged lymph nodes identified. The abdominal aorta is normal in caliber. Reproductive: The bladder has been removed. The prostate gland also appears likely to have been removed. Other: Small right inguinal hernia contains fat. Musculoskeletal: No acute or significant osseous findings. IMPRESSION: Multiple high attenuation areas of free fluid throughout the peritoneal cavity consistent with extravasated urine. The highest density collection is just anterior to the ileal conduit. Electronically Signed   By: Aletta Edouard M.D.   On: 10/04/2017 08:52   Ct Image Guided Drainage By Percutaneous Catheter  Result Date: 10/04/2017 CLINICAL DATA:  Status post prior cystoprostatectomy and ileal conduit diversion for bladder carcinoma. 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 EXAM: CT GUIDED  CATHETER  DRAINAGE OF PERITONEAL URINOMA ANESTHESIA/SEDATION: 2.0 mg IV Versed 100 mcg IV Fentanyl Total Moderate Sedation Time:  22 minutes The patient's level of consciousness and physiologic status were continuously monitored during the procedure by Radiology nursing. PROCEDURE: The procedure, risks, benefits, and alternatives were explained to the patient. Questions regarding the procedure were encouraged and answered. The patient understands and consents to the procedure. A time out was performed prior to initiating the procedure. The right anterior abdominal wall was prepped with chlorhexidine in a sterile fashion, and a sterile drape was applied covering the operative field. A sterile gown and sterile gloves were used for the procedure. Local anesthesia was provided with 1% Lidocaine. CT was performed through the lower abdomen and upper pelvis in a supine position. 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 Aspiration of fluid was performed through the needle. A guidewire was then advanced into the collection. The tract was dilated and a 10 French percutaneous drainage catheter advanced. The catheter was formed and connected to a gravity drainage bag. A urine sample was sent for culture analysis. The catheter was secured at skin with a Prolene retention suture. COMPLICATIONS: None FINDINGS: The highest density pocket of extravasated urine containing contrast material lying anterior to the ileal conduit was targeted. There was return of turbid urine which was sent for culture analysis. After placement of the 10 French drainage catheter and connection to a gravity drainage bag, there was prompt return of urine. 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 Urine return was turbid and a sample sent for culture analysis. A  10 French drainage catheter was placed and connected to gravity bag drainage. Electronically Signed   By: Aletta Edouard M.D.   On: 10/04/2017 15:06    Assessment/Plan: 66 yo two months post-op from an open cystectomy, ileal conduit diversion, rectal repair and loop colostomy with ureteral stents pulled 10/01/2017  -Clinically improving.  Continue broad spectrum abx coverage with vanc/zosyn.  Cultures pending.   -Will continue to monitor UOP from perc drain.  If output remains high, will consider placing a Foley catheter in his urostomy in hopes of better diverting his urine.  He could potentially need PCNs if urine leak persists.  Will continue to closely monitor.    LOS: 1 day   Ellison Hughs, MD 10/05/2017, 11:47 AM  [facility] Pager: [phone]

## 2017-10-06 ENCOUNTER — Inpatient Hospital Stay (HOSPITAL_COMMUNITY): Payer: Medicare Other

## 2017-10-06 LAB — CREATININE, SERUM
Creatinine, Ser: 1.03 mg/dL (ref 0.61–1.24)
GFR calc Af Amer: >60 mL/min (ref >60)

## 2017-10-06 LAB — CBC
HCT: 31 % — ABNORMAL LOW (ref 39.0–52.0)
HEMOGLOBIN: 9.6 g/dL — AB (ref 13.0–17.0)
MCH: 25.3 pg — AB (ref 26.0–34.0)
MCHC: 31 g/dL (ref 30.0–36.0)
MCV: 81.6 fL (ref 78.0–100.0)
Platelets: 252 10*3/uL (ref 150–400)
RBC: 3.8 MIL/uL — AB (ref 4.22–5.81)
RDW: 15.4 % (ref 11.5–15.5)
WBC: 14.9 10*3/uL — ABNORMAL HIGH (ref 4.0–10.5)

## 2017-10-06 LAB — BASIC METABOLIC PANEL
Anion gap: 7 (ref 5–15)
BUN: 17 mg/dL (ref 6–20)
CHLORIDE: 107 mmol/L (ref 101–111)
CO2: 23 mmol/L (ref 22–32)
CREATININE: 0.9 mg/dL (ref 0.61–1.24)
Calcium: 7.9 mg/dL — ABNORMAL LOW (ref 8.9–10.3)
GFR calc Af Amer: >60 mL/min (ref >60)
GFR calc non Af Amer: >60 mL/min (ref >60)
GLUCOSE: 99 mg/dL (ref 65–99)
POTASSIUM: 3.1 mmol/L — AB (ref 3.5–5.1)
SODIUM: 137 mmol/L (ref 135–145)

## 2017-10-06 LAB — PHOSPHORUS: Phosphorus: 1.7 mg/dL — ABNORMAL LOW (ref 2.5–4.6)

## 2017-10-06 LAB — PROCALCITONIN: Procalcitonin: 3.76 ng/mL

## 2017-10-06 LAB — MAGNESIUM: Magnesium: 2 mg/dL (ref 1.7–2.4)

## 2017-10-06 MED ORDER — SENNOSIDES-DOCUSATE SODIUM 8.6-50 MG PO TABS
2.0000 | ORAL_TABLET | Freq: Two times a day (BID) | ORAL | Status: DC
  Administered 2017-10-06 – 2017-10-14 (×15): 2 via ORAL
  Filled 2017-10-06 (×15): qty 2

## 2017-10-06 MED ORDER — IOPAMIDOL (ISOVUE-300) INJECTION 61%
INTRAVENOUS | Status: AC
  Filled 2017-10-06: qty 100

## 2017-10-06 MED ORDER — POTASSIUM CHLORIDE 10 MEQ/100ML IV SOLN
10.0000 meq | INTRAVENOUS | Status: AC
  Administered 2017-10-06 (×4): 10 meq via INTRAVENOUS
  Filled 2017-10-06 (×4): qty 100

## 2017-10-06 MED ORDER — IOPAMIDOL (ISOVUE-300) INJECTION 61%
100.0000 mL | Freq: Once | INTRAVENOUS | Status: AC | PRN
  Administered 2017-10-06: 100 mL via INTRAVENOUS

## 2017-10-06 MED ORDER — POTASSIUM & SODIUM PHOSPHATES 280-160-250 MG PO PACK
1.0000 | PACK | Freq: Three times a day (TID) | ORAL | Status: AC
  Filled 2017-10-06 (×3): qty 1

## 2017-10-06 MED ORDER — METOCLOPRAMIDE HCL 5 MG/ML IJ SOLN
10.0000 mg | Freq: Once | INTRAMUSCULAR | Status: AC
  Administered 2017-10-06: 10 mg via INTRAVENOUS
  Filled 2017-10-06: qty 2

## 2017-10-06 MED ORDER — POLYETHYLENE GLYCOL 3350 17 G PO PACK
17.0000 g | PACK | Freq: Every day | ORAL | Status: DC
  Filled 2017-10-06 (×4): qty 1

## 2017-10-06 MED ORDER — PROMETHAZINE HCL 25 MG/ML IJ SOLN
12.5000 mg | Freq: Four times a day (QID) | INTRAMUSCULAR | Status: DC | PRN
  Administered 2017-10-06 – 2017-10-07 (×4): 12.5 mg via INTRAVENOUS
  Filled 2017-10-06 (×4): qty 1

## 2017-10-06 NOTE — Progress Notes (Signed)
To CT

## 2017-10-06 NOTE — Progress Notes (Signed)
Spoke to Dr Karleen Hampshire r/t Lg BM via colostomy and unable to tolerate PO KCL.She updated pt on CT results

## 2017-10-06 NOTE — Progress Notes (Signed)
Subjective: Feeling marginally better this morning, but still having nausea with vomiting. Nausea is improved with Phenergan. Abdominal pain improving.  Objective: Vital signs in last 24 hours: Temp:  [98.9 F (37.2 C)-101.1 F (38.4 C)] 98.9 F (37.2 C) (11/25 0534) Pulse Rate:  [93-116] 93 (11/25 0534) Resp:  [18-20] 20 (11/25 0534) BP: (117-147)/(68-81) 147/81 (11/25 0534) SpO2:  [96 %-100 %] 100 % (11/25 0534)  Intake/Output from previous day: 11/24 0701 - 11/25 0700 In: 3416.7 [P.O.:720; I.V.:2346.7; IV Piggyback:350] Out: 2380 [Urine:1450; Drains:930]  Intake/Output this shift: Total I/O In: 120 [P.O.:120] Out: 220 [Drains:220]  Physical Exam:  General: Alert and oriented CV: RRR, palpable distal pulses Lungs: CTAB, equal chest rise Abdomen: Soft, NTND, no rebound or guarding. Urostomy and colostomy are viable.  Urostomy is draining clear-yellow urine.  Small amount of stool in colostomy bag.  RLQ perc drain in place with clear-yellow urine output (similar in appearance as his urostomy output)   Ext: NT, No erythema  Lab Results: Recent Labs    10/04/17 0522 10/05/17 0602 10/06/17 0431  HGB 11.6* 9.4* 9.6*  HCT 36.6* 30.2* 31.0*   BMET Recent Labs    10/04/17 0522 10/05/17 0602 10/06/17 0431  NA 140 137  --   K 4.2 3.5  --   CL 112* 107  --   CO2 20* 23  --   GLUCOSE 118* 107*  --   BUN 23* 19  --   CREATININE 1.66* 0.99 1.03  CALCIUM 8.2* 7.8*  --      Studies/Results: Ct Image Guided Drainage By Percutaneous Catheter  Result Date: 10/04/2017 CLINICAL DATA:  Status post prior cystoprostatectomy and ileal conduit diversion for bladder carcinoma. 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 EXAM: CT GUIDED CATHETER DRAINAGE OF PERITONEAL URINOMA ANESTHESIA/SEDATION: 2.0 mg IV Versed 100 mcg IV Fentanyl Total Moderate Sedation Time:  22 minutes The patient's  level of consciousness and physiologic status were continuously monitored during the procedure by Radiology nursing. PROCEDURE: The procedure, risks, benefits, and alternatives were explained to the patient. Questions regarding the procedure were encouraged and answered. The patient understands and consents to the procedure. A time out was performed prior to initiating the procedure. The right anterior abdominal wall was prepped with chlorhexidine in a sterile fashion, and a sterile drape was applied covering the operative field. A sterile gown and sterile gloves were used for the procedure. Local anesthesia was provided with 1% Lidocaine. CT was performed through the lower abdomen and upper pelvis in a supine position. 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 Aspiration of fluid was performed through the needle. A guidewire was then advanced into the collection. The tract was dilated and a 10 French percutaneous drainage catheter advanced. The catheter was formed and connected to a gravity drainage bag. A urine sample was sent for culture analysis. The catheter was secured at skin with a Prolene retention suture. COMPLICATIONS: None FINDINGS: The highest density pocket of extravasated urine containing contrast material lying anterior to the ileal conduit was targeted. There was return of turbid urine which was sent for culture analysis. After placement of the 10 French drainage catheter and connection to a gravity drainage bag, there was prompt return of urine. 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 IMPRESSION: CT-guided percutaneous catheter drainage of urinoma anterior  to the ileal conduit. Urine return was turbid and a sample sent for culture analysis. A 10 French drainage catheter was placed and connected to gravity bag drainage. Electronically Signed   By: Aletta Edouard M.D.   On: 10/04/2017  15:06    Assessment/Plan:  66 year old male two months post-op from an open cystectomy, ileal conduit diversion,rectal repair andloop colostomywith ureteral stents pulled 10/01/2017.  Now with suspected urine leak and Enterobacter/Klebsiella bacteremia.   -The urine output from his percutaneous drain is slowly dropping, but he is having persistent nausea/vomiting which is likely secondary to peritoneal irritation from the urine leak. Will discuss possible right percutaneous nephrostomy tube placement and nephrostogram at some point next week with Dr. [name].  Will make him NPO at midnight, should Dr. [name] want to proceed with PCN tomorrow.     LOS: 2 days   Ellison Hughs, MD 10/06/2017, 11:44 AM  [facility] Pager: [phone]

## 2017-10-06 NOTE — Progress Notes (Signed)
PROGRESS NOTE    Kristopher Thompson, DDS  QBH:[id number] DOB: [birth year] DOA: 10/04/2017 PCP: Kristopher Dy, MD   Brief Narrative: Kristopher Thompson a66 y.o.male,..With history of prostate cancer, bladder cancer status post radical cystectomy and ileal conduit urinary diversion on 0/97/3532, complicated by rectal injury which was repaired by general surgery status post diverting colostomy came to hospital with complaints of abdominal pain for past 2 days. 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 Urology and surgery consulted and on board.     Assessment & Plan:   Active Problems:   Sepsis ([place])   History of bladder cancer status post radical cystectomy ileal conduit, urinary diversion on 9/92 complicated by a rectal injury status post diverting colostomy.  Removal of ureteral stents on 11/20. Status post percutaneous percutaneous catheter drainage of urinoma on 11/23. The drain has clear urine. Urology and IR on board. Currently on broad-spectrum antibiotics and cultures are pending. Pain control as needed, ambulate out of bed. Patient continues to have persistent nausea and vomiting.  Unclear etiology.  Ordered CT abdomen and pelvis with IV contrast for further evaluation.  Appreciate urology recommendations.   Klebsiella bacteremia Currently on IV vancomycin and Zosyn, resume broad-spectrum antibiotics.  Await identification and sensitivities.     Anemia of chronic disease Hemoglobin around 9.   Acute renal failure Probably secondary to sepsis and dehydration.  After appropriate hydration with normal saline creatinine is back at baseline.  Hypokalemia replaced  hypophosphatemia replaced    DVT prophylaxis: Lovenox Code Status: Full code Family Communication: Family at bedside Disposition Plan: Pending further evaluation by urology  Consultants:   IR  Urology  Surgery   Procedures: Percutaneous drain placement by  IR  Antimicrobials: IV vancomycin and Zosyn since admission  Subjective: Persistent nausea and vomiting.  Able to pass flatus.  Objective: Vitals:   10/05/17 1601 10/05/17 2105 10/06/17 0534 10/06/17 1418  BP: 117/68 119/71 (!) 147/81 (!) 150/85  Pulse: (!) 116 96 93 87  Resp: 20 18 20 18   Temp: (!) 101.1 F (38.4 C) 99.2 F (37.3 C) 98.9 F (37.2 C) 98.5 F (36.9 C)  TempSrc: Oral Oral Oral Oral  SpO2: 97% 96% 100% 98%  Weight:      Height:        Intake/Output Summary (Last 24 hours) at 10/06/2017 1720 Last data filed at 10/06/2017 1500 Gross per 24 hour  Intake 2870 ml  Output 2000 ml  Net 870 ml   Filed Weights   10/04/17 0111  Weight: 114.7 kg (252 lb 13.9 oz)    Examination:  General exam: Moderate distress from nausea and vomiting Respiratory system: Diminished at bases no wheezing or rhonchi Cardiovascular system: S1 & S2 heard, RRR.  No pedal edema. Gastrointestinal system: Soft, mildly tender, nondistended, bowel sounds heard.  Urostomy and colostomy present urostomy draining clear yellow urine.  Right lower quadrant percutaneous drain in place. Central nervous system: Alert and oriented. No focal neurological deficits. Extremities: Symmetric 5 x 5 power. Skin: No rashes, lesions or ulcers Psychiatry: Mood & affect appropriate.     Data Reviewed: I have personally reviewed following labs and imaging studies  CBC: Recent Labs  Lab 10/04/17 0522 10/05/17 0602 10/06/17 0431  WBC 18.4* 12.9* 14.9*  HGB 11.6* 9.4* 9.6*  HCT 36.6* 30.2* 31.0*  MCV 79.7 81.2 81.6  PLT 303 252 426   Basic Metabolic Panel: Recent Labs  Lab 10/04/17 0522 10/05/17 0602 10/06/17 0431 10/06/17 1333  NA 140 137  --  137  K 4.2 3.5  --  3.1*  CL 112* 107  --  107  CO2 20* 23  --  23  GLUCOSE 118* 107*  --  99  BUN 23* 19  --  17  CREATININE 1.66* 0.99 1.03 0.90  CALCIUM 8.2* 7.8*  --  7.9*  MG  --   --   --  2.0  PHOS  --   --   --  1.7*   GFR: Estimated  Creatinine Clearance: 106.9 mL/min (by C-G formula based on SCr of 0.9 mg/dL). Liver Function Tests: Recent Labs  Lab 10/04/17 0522 10/05/17 0602  AST 28 16  ALT 31 21  ALKPHOS 67 61  BILITOT 0.7 0.8  PROT 6.5 5.6*  ALBUMIN 2.7* 2.1*   No results for input(s): LIPASE, AMYLASE in the last 168 hours. No results for input(s): AMMONIA in the last 168 hours. Coagulation Profile: No results for input(s): INR, PROTIME in the last 168 hours. Cardiac Enzymes: No results for input(s): CKTOTAL, CKMB, CKMBINDEX, TROPONINI in the last 168 hours. BNP (last 3 results) No results for input(s): PROBNP in the last 8760 hours. HbA1C: No results for input(s): HGBA1C in the last 72 hours. CBG: No results for input(s): GLUCAP in the last 168 hours. Lipid Profile: No results for input(s): CHOL, HDL, LDLCALC, TRIG, CHOLHDL, LDLDIRECT in the last 72 hours. Thyroid Function Tests: No results for input(s): TSH, T4TOTAL, FREET4, T3FREE, THYROIDAB in the last 72 hours. Anemia Panel: No results for input(s): VITAMINB12, FOLATE, FERRITIN, TIBC, IRON, RETICCTPCT in the last 72 hours. Sepsis Labs: Recent Labs  Lab 10/04/17 0522 10/04/17 0813 10/05/17 0602 10/06/17 0431  PROCALCITON 4.58  --  5.36 3.76  LATICACIDVEN 1.7 2.0* 1.3  --     Recent Results (from the past 240 hour(s))  Culture, blood (Routine X 2) w Reflex to ID Panel     Status: Abnormal (Preliminary result)   Collection Time: 10/04/17  5:33 AM  Result Value Ref Range Status   Specimen Description BLOOD LEFT ANTECUBITAL  Final   Special Requests   Final    BOTTLES DRAWN AEROBIC AND ANAEROBIC Blood Culture adequate volume   Culture  Setup Time   Final    GRAM NEGATIVE RODS AEROBIC BOTTLE ONLY CRITICAL RESULT CALLED TO, READ BACK BY AND VERIFIED WITH: J. GRIMSLEY, RPHARMD ([place]) AT 0308 ON 10/05/17 BY C. JESSUP, MLT.    Culture (A)  Final    KLEBSIELLA PNEUMONIAE SUSCEPTIBILITIES TO FOLLOW Performed at [facility], [place]  [address], [place], [place] [address]    Report Status PENDING  Incomplete  Blood Culture ID Panel (Reflexed)     Status: Abnormal   Collection Time: 10/04/17  5:33 AM  Result Value Ref Range Status   Enterococcus species NOT DETECTED NOT DETECTED Final   Listeria monocytogenes NOT DETECTED NOT DETECTED Final   Staphylococcus species NOT DETECTED NOT DETECTED Final   Staphylococcus aureus NOT DETECTED NOT DETECTED Final   Streptococcus species NOT DETECTED NOT DETECTED Final   Streptococcus agalactiae NOT DETECTED NOT DETECTED Final   Streptococcus pneumoniae NOT DETECTED NOT DETECTED Final   Streptococcus pyogenes NOT DETECTED NOT DETECTED Final   Acinetobacter baumannii NOT DETECTED NOT DETECTED Final   Enterobacteriaceae species DETECTED (A) NOT DETECTED Final    Comment: Enterobacteriaceae represent a large family of gram-negative bacteria, not a single organism. CRITICAL RESULT CALLED TO, READ BACK BY AND VERIFIED WITH: Lavell Luster, RPHARMD ([place]) AT 0308 ON 10/05/17 BY C.  JESSUP, MLT.    Enterobacter cloacae complex NOT DETECTED NOT DETECTED Final   Escherichia coli NOT DETECTED NOT DETECTED Final   Klebsiella oxytoca NOT DETECTED NOT DETECTED Final   Klebsiella pneumoniae DETECTED (A) NOT DETECTED Final    Comment: CRITICAL RESULT CALLED TO, READ BACK BY AND VERIFIED WITH: J. GRIMSLEY, RPHARMD ([place]) AT 0308 ON 10/05/17 BY C. JESSUP, MLT.    Proteus species NOT DETECTED NOT DETECTED Final   Serratia marcescens NOT DETECTED NOT DETECTED Final   Carbapenem resistance NOT DETECTED NOT DETECTED Final   Haemophilus influenzae NOT DETECTED NOT DETECTED Final   Neisseria meningitidis NOT DETECTED NOT DETECTED Final   Pseudomonas aeruginosa NOT DETECTED NOT DETECTED Final   Candida albicans NOT DETECTED NOT DETECTED Final   Candida glabrata NOT DETECTED NOT DETECTED Final   Candida krusei NOT DETECTED NOT DETECTED Final   Candida parapsilosis NOT DETECTED NOT DETECTED Final   Candida  tropicalis NOT DETECTED NOT DETECTED Final    Comment: Performed at [facility], [place] [address] [place], [place] [address]  Culture, blood (Routine X 2) w Reflex to ID Panel     Status: None (Preliminary result)   Collection Time: 10/04/17  5:35 AM  Result Value Ref Range Status   Specimen Description BLOOD RIGHT ANTECUBITAL  Final   Special Requests   Final    BOTTLES DRAWN AEROBIC AND ANAEROBIC Blood Culture adequate volume   Culture   Final    NO GROWTH 2 DAYS Performed at [facility], [place] [address], [place], [place] [address]    Report Status PENDING  Incomplete  Culture, Urine     Status: Abnormal   Collection Time: 10/04/17  5:47 AM  Result Value Ref Range Status   Specimen Description URINE, RANDOM  Final   Special Requests NONE  Final   Culture MULTIPLE SPECIES PRESENT, SUGGEST RECOLLECTION (A)  Final   Report Status 10/05/2017 FINAL  Final  Aerobic/Anaerobic Culture (surgical/deep wound)     Status: None (Preliminary result)   Collection Time: 10/04/17 12:19 PM  Result Value Ref Range Status   Specimen Description ABSCESS  Final   Special Requests Normal  Final   Gram Stain   Final    ABUNDANT WBC PRESENT,BOTH PMN AND MONONUCLEAR ABUNDANT GRAM VARIABLE ROD FEW GRAM POSITIVE COCCI IN PAIRS IN CHAINS    Culture   Final    CULTURE REINCUBATED FOR BETTER GROWTH HOLDING FOR POSSIBLE ANAEROBE Performed at [facility], [place] [address], [place], [place] [address]    Report Status PENDING  Incomplete         Radiology Studies: Ct Abdomen Pelvis W Contrast  Result Date: 10/06/2017 CLINICAL DATA:  History of prostate and bladder cancer, status post radical cystectomy and ileal conduit urinary diversion complicated by a rectal injury repaired with diverting colostomy. Abdominal distention. Recent percutaneous catheter drainage of urinoma EXAM: CT ABDOMEN AND PELVIS WITH CONTRAST TECHNIQUE: Multidetector CT imaging of the abdomen and pelvis was  performed using the standard protocol following bolus administration of intravenous contrast. CONTRAST:  143mL ISOVUE-300 IOPAMIDOL (ISOVUE-300) INJECTION 61% COMPARISON:  09/03/2017 exams FINDINGS: Lower chest: Atelectasis with trace pleural effusions, stable in appearance. Heart size is top normal with coronary arteriosclerosis and aortic atherosclerosis. Hepatobiliary: Status post cholecystectomy. No biliary dilatation. Mild hepatic steatosis. Pancreas: Atrophic pancreas with scattered pancreatic calcifications that may reflect chronic pancreatitis. No acute inflammation, ductal dilatation or mass. Spleen: Normal size without focal abnormality. Adrenals/Urinary Tract: Normal bilateral  adrenal glands. 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 No significant reaccumulation of fluid. The bladder has been resected. Stomach/Bowel: Left lower quadrant colostomy with scattered colonic diverticulosis along the distal descending and sigmoid colon. Moderate stool is noted within the colon leading up to the colostomy in the left hemipelvis. Stable subcutaneous soft tissue induration about the ileal conduit and left lower quadrant colostomy site. Vascular/Lymphatic: Aortic atherosclerosis. No enlarged abdominal or pelvic lymph nodes. Reproductive: Prostate is not visualized and appears to have been resected. Other: Small right-sided inguinal hernia containing fat. No free air. Musculoskeletal: No acute or significant osseous findings. IMPRESSION: 1. Status post percutaneous drainage urinoma in the right lower quadrant without reaccumulation of fluid noted. 2. 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 No bowel obstruction. No acute inflammation. 3. Stable trace pleural effusions and atelectasis at each lung base. 4. Mild hepatic  steatosis.  status post cholecystectomy. 5. 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 Electronically Signed   By: Ashley Royalty M.D.   On: 10/06/2017 16:59        Scheduled Meds: . enoxaparin (LOVENOX) injection  40 mg Subcutaneous Q24H  . iopamidol      . pantoprazole  40 mg Oral Daily  . polyethylene glycol  17 g Oral Daily  . protein supplement shake  11 oz Oral BID BM  . senna-docusate  2 tablet Oral BID   Continuous Infusions: . sodium chloride 100 mL/hr at 10/06/17 1223  . piperacillin-tazobactam (ZOSYN)  IV 3.375 g (10/06/17 1403)  . vancomycin 1,000 mg (10/06/17 1637)     LOS: 2 days    Time spent: 35 minutes    Kristopher Poisson, MD [facility] Pager [phone]  If 7PM-7AM, please contact night-coverage [email or web address] Password [email or web address] 10/06/2017, 5:20 PM

## 2017-10-07 ENCOUNTER — Inpatient Hospital Stay (HOSPITAL_COMMUNITY): Payer: Medicare Other

## 2017-10-07 ENCOUNTER — Encounter (HOSPITAL_COMMUNITY): Payer: Self-pay | Admitting: Interventional Radiology

## 2017-10-07 HISTORY — PX: IR NEPHROSTOMY PLACEMENT LEFT: IMG6063

## 2017-10-07 LAB — PROTIME-INR
INR: 1.18
Prothrombin Time: 14.9 seconds (ref 11.4–15.2)

## 2017-10-07 LAB — CBC
HEMATOCRIT: 32.2 % — AB (ref 39.0–52.0)
Hemoglobin: 10.3 g/dL — ABNORMAL LOW (ref 13.0–17.0)
MCH: 25.1 pg — ABNORMAL LOW (ref 26.0–34.0)
MCHC: 32 g/dL (ref 30.0–36.0)
MCV: 78.3 fL (ref 78.0–100.0)
PLATELETS: 312 10*3/uL (ref 150–400)
RBC: 4.11 MIL/uL — ABNORMAL LOW (ref 4.22–5.81)
RDW: 14.9 % (ref 11.5–15.5)
WBC: 14.9 10*3/uL — AB (ref 4.0–10.5)

## 2017-10-07 LAB — COMPREHENSIVE METABOLIC PANEL
ALT: 15 U/L — ABNORMAL LOW (ref 17–63)
ANION GAP: 9 (ref 5–15)
AST: 14 U/L — ABNORMAL LOW (ref 15–41)
Albumin: 2.3 g/dL — ABNORMAL LOW (ref 3.5–5.0)
Alkaline Phosphatase: 64 U/L (ref 38–126)
BILIRUBIN TOTAL: 0.6 mg/dL (ref 0.3–1.2)
BUN: 19 mg/dL (ref 6–20)
CHLORIDE: 112 mmol/L — AB (ref 101–111)
CO2: 20 mmol/L — ABNORMAL LOW (ref 22–32)
Calcium: 8.1 mg/dL — ABNORMAL LOW (ref 8.9–10.3)
Creatinine, Ser: 0.85 mg/dL (ref 0.61–1.24)
GFR calc Af Amer: >60 mL/min (ref >60)
Glucose, Bld: 107 mg/dL — ABNORMAL HIGH (ref 65–99)
POTASSIUM: 3.2 mmol/L — AB (ref 3.5–5.1)
Sodium: 141 mmol/L (ref 135–145)
TOTAL PROTEIN: 5.9 g/dL — AB (ref 6.5–8.1)

## 2017-10-07 LAB — CULTURE, BLOOD (ROUTINE X 2): SPECIAL REQUESTS: ADEQUATE

## 2017-10-07 LAB — CREATININE, FLUID (PLEURAL, PERITONEAL, JP DRAINAGE)

## 2017-10-07 MED ORDER — FENTANYL CITRATE (PF) 100 MCG/2ML IJ SOLN
INTRAMUSCULAR | Status: AC
  Filled 2017-10-07: qty 4

## 2017-10-07 MED ORDER — IOPAMIDOL (ISOVUE-300) INJECTION 61%
50.0000 mL | Freq: Once | INTRAVENOUS | Status: AC | PRN
  Administered 2017-10-07: 20 mL

## 2017-10-07 MED ORDER — IOPAMIDOL (ISOVUE-300) INJECTION 61%: 50.0000 mL | Freq: Once | INTRAVENOUS | Status: DC | PRN

## 2017-10-07 MED ORDER — LIDOCAINE-EPINEPHRINE (PF) 2 %-1:200000 IJ SOLN
INTRAMUSCULAR | Status: AC | PRN
  Administered 2017-10-07: 20 mL

## 2017-10-07 MED ORDER — ONDANSETRON HCL 4 MG/2ML IJ SOLN
INTRAMUSCULAR | Status: AC
  Filled 2017-10-07: qty 2

## 2017-10-07 MED ORDER — MIDAZOLAM HCL 2 MG/2ML IJ SOLN
INTRAMUSCULAR | Status: AC
  Filled 2017-10-07: qty 6

## 2017-10-07 MED ORDER — IOPAMIDOL (ISOVUE-300) INJECTION 61%
INTRAVENOUS | Status: AC
  Filled 2017-10-07: qty 50

## 2017-10-07 MED ORDER — FENTANYL CITRATE (PF) 100 MCG/2ML IJ SOLN
INTRAMUSCULAR | Status: AC | PRN
  Administered 2017-10-07: 50 ug via INTRAVENOUS
  Administered 2017-10-07: 25 ug via INTRAVENOUS
  Administered 2017-10-07 (×2): 50 ug via INTRAVENOUS

## 2017-10-07 MED ORDER — POTASSIUM CHLORIDE 10 MEQ/100ML IV SOLN
10.0000 meq | INTRAVENOUS | Status: AC
  Administered 2017-10-07 (×2): 10 meq via INTRAVENOUS
  Filled 2017-10-07 (×2): qty 100

## 2017-10-07 MED ORDER — LIDOCAINE-EPINEPHRINE (PF) 2 %-1:200000 IJ SOLN
INTRAMUSCULAR | Status: AC
  Filled 2017-10-07: qty 20

## 2017-10-07 MED ORDER — MIDAZOLAM HCL 2 MG/2ML IJ SOLN
INTRAMUSCULAR | Status: AC | PRN
  Administered 2017-10-07: 1 mg via INTRAVENOUS
  Administered 2017-10-07: 0.5 mg via INTRAVENOUS
  Administered 2017-10-07 (×4): 1 mg via INTRAVENOUS

## 2017-10-07 MED ORDER — SODIUM CHLORIDE 0.9% FLUSH
5.0000 mL | Freq: Three times a day (TID) | INTRAVENOUS | Status: DC
  Administered 2017-10-09 (×2): 5 mL via INTRAVENOUS

## 2017-10-07 MED ORDER — PANTOPRAZOLE SODIUM 40 MG IV SOLR
40.0000 mg | INTRAVENOUS | Status: DC
  Administered 2017-10-07: 40 mg via INTRAVENOUS
  Filled 2017-10-07: qty 40

## 2017-10-07 MED ORDER — METOCLOPRAMIDE HCL 5 MG/ML IJ SOLN
5.0000 mg | Freq: Four times a day (QID) | INTRAMUSCULAR | Status: DC | PRN
Start: 2017-10-07 — End: 2017-10-14
  Administered 2017-10-07: 5 mg via INTRAVENOUS
  Filled 2017-10-07: qty 2

## 2017-10-07 NOTE — Care Management Important Message (Signed)
Important Message  Patient Details  Name: Kristopher Thompson, Kristopher Thompson MRN: [id number] Date of Birth: 1951-01-31   Medicare Important Message Given:  Yes    Kerin Salen 10/07/2017, 11:47 AMImportant Message  Patient Details  Name: Kristopher Thompson, Kristopher Thompson MRN: [id number] Date of Birth: [birth year]   Medicare Important Message Given:  Yes    Kerin Salen 10/07/2017, 11:47 AM

## 2017-10-07 NOTE — Progress Notes (Signed)
Patient received back from IR.  Comfortable, no nausea or pain, resting peacefully at this time with wife at bedside.  Drain site CDI, VSS.  Will continue to monitor.

## 2017-10-07 NOTE — Progress Notes (Signed)
Patient states 'he feels great'.  No c/o nausea or pain.  Patient requesting ice water.  MD notified; patient diet orders have been changed from NPO to clear liquid diet.  Ice water given to patient. Will continue to monitor.

## 2017-10-07 NOTE — Sedation Documentation (Signed)
Patient is resting comfortably. 

## 2017-10-07 NOTE — Progress Notes (Signed)
Patient ID: Kristopher Thompson, DDS, male   DOB: 10/16/1951, 66 y.o.   MRN: [id number]    Subjective: Pt admitted last Friday with abdominal pain and fever.  Found to have urine leak at uretero-enteric anastomosis.  S/p percutaneous drainage.  Pain and fever improved.  On broad spectrum IV antibiotic therapy with cultures still pending.  Urine culture multiple species likely contaminated due to external drainage bag.  Complaining of nausea and vomiting last 24-48 hrs.  Still nauseated this morning.   Objective: Vital signs in last 24 hours: Temp:  [98.5 F (36.9 C)-99.3 F (37.4 C)] 99.2 F (37.3 C) (11/26 0449) Pulse Rate:  [85-90] 90 (11/26 0449) Resp:  [18-20] 20 (11/26 0449) BP: (134-150)/(63-85) 134/63 (11/26 0449) SpO2:  [98 %-99 %] 99 % (11/26 0449)  Intake/Output from previous day: 11/25 0701 - 11/26 0700 In: 3146.7 [P.O.:120; I.V.:2376.7; IV Piggyback:650] Out: 7412 [Urine:1350; Drains:1045; Stool:1000] Intake/Output this shift: No intake/output data recorded.  Physical Exam:  General: Alert and oriented CV: RRR Lungs: Clear Abdomen: Soft, ND, Positive bowel sounds, wound packed and clean, urostomy pink and clear urine in conduit bag, percutaneous drain with serous/yellow fluid, colostomy with stool in bag Ext: NT, No erythema  Lab Results: Recent Labs    10/05/17 0602 10/06/17 0431 10/07/17 0456  HGB 9.4* 9.6* 10.3*  HCT 30.2* 31.0* 32.2*   CBC Latest Ref Rng & Units 10/07/2017 10/06/2017 10/05/2017  WBC 4.0 - 10.5 K/uL 14.9(H) 14.9(H) 12.9(H)  Hemoglobin 13.0 - 17.0 g/dL 10.3(L) 9.6(L) 9.4(L)  Hematocrit 39.0 - 52.0 % 32.2(L) 31.0(L) 30.2(L)  Platelets 150 - 400 K/uL 312 252 252     BMET Recent Labs    10/06/17 1333 10/07/17 0456  NA 137 141  K 3.1* 3.2*  CL 107 112*  CO2 23 20*  GLUCOSE 99 107*  BUN 17 19  CREATININE 0.90 0.85  CALCIUM 7.9* 8.1*     Studies/Results: Ct Abdomen Pelvis W Contrast  Result Date: 10/06/2017 CLINICAL DATA:   History of prostate and bladder cancer, status post radical cystectomy and ileal conduit urinary diversion complicated by a rectal injury repaired with diverting colostomy. Abdominal distention. Recent percutaneous catheter drainage of urinoma EXAM: CT ABDOMEN AND PELVIS WITH CONTRAST TECHNIQUE: Multidetector CT imaging of the abdomen and pelvis was performed using the standard protocol following bolus administration of intravenous contrast. CONTRAST:  130mL ISOVUE-300 IOPAMIDOL (ISOVUE-300) INJECTION 61% COMPARISON:  09/03/2017 exams FINDINGS: Lower chest: Atelectasis with trace pleural effusions, stable in appearance. Heart size is top normal with coronary arteriosclerosis and aortic atherosclerosis. Hepatobiliary: Status post cholecystectomy. No biliary dilatation. Mild hepatic steatosis. Pancreas: Atrophic pancreas with scattered pancreatic calcifications that may reflect chronic pancreatitis. No acute inflammation, ductal dilatation or mass. Spleen: Normal size without focal abnormality. Adrenals/Urinary Tract: Normal bilateral adrenal glands. 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 No significant reaccumulation of fluid. The bladder has been resected. Stomach/Bowel: Left lower quadrant colostomy with scattered colonic diverticulosis along the distal descending and sigmoid colon. Moderate stool is noted within the colon leading up to the colostomy in the left hemipelvis. Stable subcutaneous soft tissue induration about the ileal conduit and left lower quadrant colostomy site. Vascular/Lymphatic: Aortic atherosclerosis. No enlarged abdominal or pelvic lymph nodes. Reproductive: Prostate is not visualized and appears to have been resected. Other: Small right-sided inguinal hernia containing fat. No free air. Musculoskeletal: No  acute or  significant osseous findings. IMPRESSION: 1. Status post percutaneous drainage urinoma in the right lower quadrant without reaccumulation of fluid noted. 2. 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 No bowel obstruction. No acute inflammation. 3. Stable trace pleural effusions and atelectasis at each lung base. 4. Mild hepatic steatosis.  status post cholecystectomy. 5. 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 Electronically Signed   By: Ashley Royalty M.D.   On: 10/06/2017 16:59    Assessment/Plan: Delayed urine leak s/p radical cystectomy for locally advanced bladder cancer 2 months ago. - Improving s/p percutaneous drainage.  However, in light of delayed fashion of urine leak, I am hesitant to think this will heal with percutaneous drainage alone.  Will re-check drain fluid to see if still c/w urine.  If so, he likely will benefit from proximal drainage and based on CT from 11/23, the most likely source of leak would be the left uretero-enteric anastomosis and so left PCN may be very beneficial.  Will discuss with IR this morning.  Would keep NPO in the meantime. - Continue broad spectrum IV antibiotics pending final cultures. - GI symptoms likely due to peritoneal irritation from urine leak.  Now having BM in colostomy and would hope these symptoms will resolve.   LOS: 3 days   Annalysa Mohammad,LES 10/07/2017, 7:36 AM

## 2017-10-07 NOTE — Progress Notes (Signed)
MEDICATION-RELATED CONSULT NOTE   IR Procedure Consult - Anticoagulant/Antiplatelet PTA/Inpatient Med List Review by Pharmacist    Procedure: PCN    Completed: 11/26 13:00  Post-Procedural bleeding risk per IR MD assessment:    Antithrombotic medications on inpatient or PTA profile prior to procedure:   Enoxaparin prophylaxis    Recommended restart time per IR Post-Procedure Guidelines:  Day + 1 (next am)   Other considerations:      Plan:      Continue enoxaparin 40mg  SQ q24h with dose due 11/27 at [place], PharmD, BCPS.   Pager: [phone] 10/07/2017 3:41 PM

## 2017-10-07 NOTE — Sedation Documentation (Signed)
Patient is resting comfortably. 

## 2017-10-07 NOTE — Progress Notes (Signed)
PROGRESS NOTE    KORIE BRABSON, DDS  ACZ:[id number] DOB: 03/19/1951 DOA: 10/04/2017 PCP: Lorene Dy, MD   Brief Narrative: [name] a66 y.o.male,..With history of prostate cancer, bladder cancer status post radical cystectomy and ileal conduit urinary diversion on 11/20/3233, complicated by rectal injury which was repaired by general surgery status post diverting colostomy came to hospital with complaints of abdominal pain for past 2 days. 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 Urology and surgery consulted and on board.     Assessment & Plan:   Active Problems:   Sepsis ([place])   History of bladder cancer status post radical cystectomy ileal conduit, urinary diversion on 5/73 complicated by a rectal injury status post diverting colostomy.  Removal of ureteral stents on 11/20. Status post percutaneous percutaneous catheter drainage of urinoma on 11/23.  Continues to drain clear urine.  Urology and IR on board. Currently on broad-spectrum antibiotics and cultures are pending. Pain control as needed, ambulate out of bed. Yesterday patient had persistent nausea and vomiting all day ordered CT abdomen pelvis with IV contrast, did not show any obstruction, or acute pathology.  Urology saw the patient today and recommended percutaneous nephrostomy by IR.   Appreciate urology recommendations.  This afternoon patient appears comfortable would like to try clear liquid diet.  Meanwhile would resume with Zofran and Phenergan and Reglan as needed.    Klebsiella bacteremia Received 4 days of IV vancomycin and IV zosyn, d/c IV vancomycin.  Resume zosyn.    Anemia of chronic disease Hemoglobin between 9-10   Acute renal failure Probably secondary to sepsis and dehydration.  After appropriate hydration with normal saline creatinine is back at baseline.  Hypokalemia replaced  Hypophosphatemia replaced    DVT prophylaxis: Lovenox Code  Status: Full code Family Communication: Family at bedside Disposition Plan: Pending further evaluation by urology  Consultants:   IR  Urology  Surgery   Procedures: Percutaneous drain placement by IR  Antimicrobials: IV vancomycin and Zosyn since admission  Subjective: Nausea improved this afternoon.  Would like to try clear liquid diet.  Objective: Vitals:   10/06/17 0534 10/06/17 1418 10/06/17 2318 10/07/17 0449  BP: (!) 147/81 (!) 150/85 137/80 134/63  Pulse: 93 87 85 90  Resp: 20 18 20 20   Temp: 98.9 F (37.2 C) 98.5 F (36.9 C) 99.3 F (37.4 C) 99.2 F (37.3 C)  TempSrc: Oral Oral Oral Oral  SpO2: 100% 98% 98% 99%  Weight:      Height:        Intake/Output Summary (Last 24 hours) at 10/07/2017 1044 Last data filed at 10/07/2017 0830 Gross per 24 hour  Intake 3026.67 ml  Output 3205 ml  Net -178.33 ml   Filed Weights   10/04/17 0111  Weight: 114.7 kg (252 lb 13.9 oz)    Examination:  General exam: Nausea improved appears exhausted Respiratory system: Air entry fair no wheezing slightly diminished at bases Cardiovascular system: S1 & S2 heard, RRR.  Trace pedal edema. Gastrointestinal system: It is soft nontender nondistended bowel sounds are heard.Jackelyn Knife and colostomy present urostomy draining clear yellow urine.  Right lower quadrant percutaneous drain in place. Central nervous system: Alert and oriented.  Nonfocal. Extremities: Symmetric 5 x 5 power. Skin: No rashes, lesions or ulcers Psychiatry: Mood & affect appropriate.     Data Reviewed: I have personally reviewed following labs and imaging studies  CBC: Recent Labs  Lab 10/04/17 0522 10/05/17 0602 10/06/17 0431 10/07/17 0456  WBC 18.4*  12.9* 14.9* 14.9*  HGB 11.6* 9.4* 9.6* 10.3*  HCT 36.6* 30.2* 31.0* 32.2*  MCV 79.7 81.2 81.6 78.3  PLT 303 252 252 606   Basic Metabolic Panel: Recent Labs  Lab 10/04/17 0522 10/05/17 0602 10/06/17 0431 10/06/17 1333 10/07/17 0456  NA  140 137  --  137 141  K 4.2 3.5  --  3.1* 3.2*  CL 112* 107  --  107 112*  CO2 20* 23  --  23 20*  GLUCOSE 118* 107*  --  99 107*  BUN 23* 19  --  17 19  CREATININE 1.66* 0.99 1.03 0.90 0.85  CALCIUM 8.2* 7.8*  --  7.9* 8.1*  MG  --   --   --  2.0  --   PHOS  --   --   --  1.7*  --    GFR: Estimated Creatinine Clearance: 113.2 mL/min (by C-G formula based on SCr of 0.85 mg/dL). Liver Function Tests: Recent Labs  Lab 10/04/17 0522 10/05/17 0602 10/07/17 0456  AST 28 16 14*  ALT 31 21 15*  ALKPHOS 67 61 64  BILITOT 0.7 0.8 0.6  PROT 6.5 5.6* 5.9*  ALBUMIN 2.7* 2.1* 2.3*   No results for input(s): LIPASE, AMYLASE in the last 168 hours. No results for input(s): AMMONIA in the last 168 hours. Coagulation Profile: Recent Labs  Lab 10/07/17 0947  INR 1.18   Cardiac Enzymes: No results for input(s): CKTOTAL, CKMB, CKMBINDEX, TROPONINI in the last 168 hours. BNP (last 3 results) No results for input(s): PROBNP in the last 8760 hours. HbA1C: No results for input(s): HGBA1C in the last 72 hours. CBG: No results for input(s): GLUCAP in the last 168 hours. Lipid Profile: No results for input(s): CHOL, HDL, LDLCALC, TRIG, CHOLHDL, LDLDIRECT in the last 72 hours. Thyroid Function Tests: No results for input(s): TSH, T4TOTAL, FREET4, T3FREE, THYROIDAB in the last 72 hours. Anemia Panel: No results for input(s): VITAMINB12, FOLATE, FERRITIN, TIBC, IRON, RETICCTPCT in the last 72 hours. Sepsis Labs: Recent Labs  Lab 10/04/17 0522 10/04/17 0813 10/05/17 0602 10/06/17 0431  PROCALCITON 4.58  --  5.36 3.76  LATICACIDVEN 1.7 2.0* 1.3  --     Recent Results (from the past 240 hour(s))  Culture, blood (Routine X 2) w Reflex to ID Panel     Status: Abnormal (Preliminary result)   Collection Time: 10/04/17  5:33 AM  Result Value Ref Range Status   Specimen Description BLOOD LEFT ANTECUBITAL  Final   Special Requests   Final    BOTTLES DRAWN AEROBIC AND ANAEROBIC Blood Culture  adequate volume   Culture  Setup Time   Final    GRAM NEGATIVE RODS AEROBIC BOTTLE ONLY CRITICAL RESULT CALLED TO, READ BACK BY AND VERIFIED WITH: J. GRIMSLEY, RPHARMD ([place]) AT 0308 ON 10/05/17 BY C. JESSUP, MLT.    Culture (A)  Final    KLEBSIELLA PNEUMONIAE SUSCEPTIBILITIES TO FOLLOW Performed at [facility], [place] [address], [place], [place] [address]    Report Status PENDING  Incomplete  Blood Culture ID Panel (Reflexed)     Status: Abnormal   Collection Time: 10/04/17  5:33 AM  Result Value Ref Range Status   Enterococcus species NOT DETECTED NOT DETECTED Final   Listeria monocytogenes NOT DETECTED NOT DETECTED Final   Staphylococcus species NOT DETECTED NOT DETECTED Final   Staphylococcus aureus NOT DETECTED NOT DETECTED Final   Streptococcus species NOT DETECTED NOT DETECTED Final   Streptococcus agalactiae NOT DETECTED  NOT DETECTED Final   Streptococcus pneumoniae NOT DETECTED NOT DETECTED Final   Streptococcus pyogenes NOT DETECTED NOT DETECTED Final   Acinetobacter baumannii NOT DETECTED NOT DETECTED Final   Enterobacteriaceae species DETECTED (A) NOT DETECTED Final    Comment: Enterobacteriaceae represent a large family of gram-negative bacteria, not a single organism. CRITICAL RESULT CALLED TO, READ BACK BY AND VERIFIED WITH: J. GRIMSLEY, RPHARMD ([place]) AT 0308 ON 10/05/17 BY C. JESSUP, MLT.    Enterobacter cloacae complex NOT DETECTED NOT DETECTED Final   Escherichia coli NOT DETECTED NOT DETECTED Final   Klebsiella oxytoca NOT DETECTED NOT DETECTED Final   Klebsiella pneumoniae DETECTED (A) NOT DETECTED Final    Comment: CRITICAL RESULT CALLED TO, READ BACK BY AND VERIFIED WITH: J. GRIMSLEY, RPHARMD ([place]) AT 0308 ON 10/05/17 BY C. JESSUP, MLT.    Proteus species NOT DETECTED NOT DETECTED Final   Serratia marcescens NOT DETECTED NOT DETECTED Final   Carbapenem resistance NOT DETECTED NOT DETECTED Final   Haemophilus influenzae NOT DETECTED NOT DETECTED Final    Neisseria meningitidis NOT DETECTED NOT DETECTED Final   Pseudomonas aeruginosa NOT DETECTED NOT DETECTED Final   Candida albicans NOT DETECTED NOT DETECTED Final   Candida glabrata NOT DETECTED NOT DETECTED Final   Candida krusei NOT DETECTED NOT DETECTED Final   Candida parapsilosis NOT DETECTED NOT DETECTED Final   Candida tropicalis NOT DETECTED NOT DETECTED Final    Comment: Performed at [facility], [place] [address], [place], [place] [address]  Culture, blood (Routine X 2) w Reflex to ID Panel     Status: None (Preliminary result)   Collection Time: 10/04/17  5:35 AM  Result Value Ref Range Status   Specimen Description BLOOD RIGHT ANTECUBITAL  Final   Special Requests   Final    BOTTLES DRAWN AEROBIC AND ANAEROBIC Blood Culture adequate volume   Culture   Final    NO GROWTH 2 DAYS Performed at [facility], [place] [address] [place],  [address]    Report Status PENDING  Incomplete  Culture, Urine     Status: Abnormal   Collection Time: 10/04/17  5:47 AM  Result Value Ref Range Status   Specimen Description URINE, RANDOM  Final   Special Requests NONE  Final   Culture MULTIPLE SPECIES PRESENT, SUGGEST RECOLLECTION (A)  Final   Report Status 10/05/2017 FINAL  Final  Aerobic/Anaerobic Culture (surgical/deep wound)     Status: None (Preliminary result)   Collection Time: 10/04/17 12:19 PM  Result Value Ref Range Status   Specimen Description ABSCESS  Final   Special Requests Normal  Final   Gram Stain   Final    ABUNDANT WBC PRESENT,BOTH PMN AND MONONUCLEAR ABUNDANT GRAM VARIABLE ROD FEW GRAM POSITIVE COCCI IN PAIRS IN CHAINS    Culture   Final    CULTURE REINCUBATED FOR BETTER GROWTH HOLDING FOR POSSIBLE ANAEROBE Performed at [facility], [place] [address], [place],  [address]    Report Status PENDING  Incomplete         Radiology Studies: Ct Abdomen Pelvis W Contrast  Result Date: 10/06/2017 CLINICAL DATA:  History of prostate and  bladder cancer, status post radical cystectomy and ileal conduit urinary diversion complicated by a rectal injury repaired with diverting colostomy. Abdominal distention. Recent percutaneous catheter drainage of urinoma EXAM: CT ABDOMEN AND PELVIS WITH CONTRAST TECHNIQUE: Multidetector CT imaging of the abdomen and pelvis was performed using the standard protocol following bolus administration of  intravenous contrast. CONTRAST:  174mL ISOVUE-300 IOPAMIDOL (ISOVUE-300) INJECTION 61% COMPARISON:  09/03/2017 exams FINDINGS: Lower chest: Atelectasis with trace pleural effusions, stable in appearance. Heart size is top normal with coronary arteriosclerosis and aortic atherosclerosis. Hepatobiliary: Status post cholecystectomy. No biliary dilatation. Mild hepatic steatosis. Pancreas: Atrophic pancreas with scattered pancreatic calcifications that may reflect chronic pancreatitis. No acute inflammation, ductal dilatation or mass. Spleen: Normal size without focal abnormality. Adrenals/Urinary Tract: Normal bilateral adrenal glands. 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 No significant reaccumulation of fluid. The bladder has been resected. Stomach/Bowel: Left lower quadrant colostomy with scattered colonic diverticulosis along the distal descending and sigmoid colon. Moderate stool is noted within the colon leading up to the colostomy in the left hemipelvis. Stable subcutaneous soft tissue induration about the ileal conduit and left lower quadrant colostomy site. Vascular/Lymphatic: Aortic atherosclerosis. No enlarged abdominal or pelvic lymph nodes. Reproductive: Prostate is not visualized and appears to have been resected. Other: Small right-sided inguinal hernia containing fat. No free air. Musculoskeletal: No acute or significant osseous  findings. IMPRESSION: 1. Status post percutaneous drainage urinoma in the right lower quadrant without reaccumulation of fluid noted. 2. 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 No bowel obstruction. No acute inflammation. 3. Stable trace pleural effusions and atelectasis at each lung base. 4. Mild hepatic steatosis.  status post cholecystectomy. 5. 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 Electronically Signed   By: Ashley Royalty M.D.   On: 10/06/2017 16:59        Scheduled Meds: . enoxaparin (LOVENOX) injection  40 mg Subcutaneous Q24H  . pantoprazole (PROTONIX) IV  40 mg Intravenous Q24H  . polyethylene glycol  17 g Oral Daily  . potassium & sodium phosphates  1 packet Oral Q8H  . protein supplement shake  11 oz Oral BID BM  . senna-docusate  2 tablet Oral BID   Continuous Infusions: . sodium chloride 100 mL/hr at 10/07/17 0551  . piperacillin-tazobactam (ZOSYN)  IV 3.375 g (10/07/17 0551)  . potassium chloride       LOS: 3 days    Time spent: 35 minutes    Hosie Poisson, MD [facility] Pager [phone]  If 7PM-7AM, please contact night-coverage [email or web address] Password [facility] 10/07/2017, 10:44 AM

## 2017-10-07 NOTE — Progress Notes (Signed)
Patient ID: Kristopher Thompson, DDS, male   DOB: [birth year], 66 y.o.   MRN: [id number]  Pt s/p L PCN placement by Dr. Pascal Lux earlier today.  Nephrostogram confirmed left distal ureteral extravasation.    Left nephrostomy proximal diversion will hopefully help to resolve urine leak.  Will monitor percutaneous drain output.    Ok to advance diet.  Nausea has not been a problem today and he has been having bowel movements.

## 2017-10-07 NOTE — Progress Notes (Signed)
Referring Physician(s): Borden,L  Supervising Physician: Sandi Mariscal  Patient Status:  Kristopher Thompson - In-pt  Chief Complaint:  Post op urine leak  Subjective: Pt c/o N/V this am; abd pain has improved since drain placed 11/23; temp 99.2   Allergies: Demerol [meperidine]  Medications: Prior to Admission medications   Medication Sig Start Date End Date Taking? Authorizing Provider  acetaminophen (TYLENOL) 500 MG tablet Take 1,000 mg by mouth daily as needed for mild pain. For pain   Yes [provider]  docusate sodium (COLACE) 100 MG capsule Take 2 capsules (200 mg total) by mouth 2 (two) times daily. 08/14/17 08/14/18 Yes McCormickLenna Sciara, MD  esomeprazole (NEXIUM) 20 MG capsule Take 20 mg by mouth daily as needed (heartburn).   Yes [provider]  HYDROcodone-acetaminophen (NORCO/VICODIN) 5-325 MG tablet Take 1-2 tablets by mouth every 6 (six) hours as needed for moderate pain (pain). 08/14/17  Yes Stasia Cavalier, MD     Vital Signs: BP 134/63 (BP Location: Right Arm)   Pulse 90   Temp 99.2 F (37.3 C) (Oral)   Resp 20   Ht 6' (1.829 m)   Wt 252 lb 13.9 oz (114.7 kg)   SpO2 99%   BMI 34.29 kg/m   Physical Exam awake/alert; chest- sl dim BS bases; heart- RRR; abd- soft,+BS,intact ileal conduit/ostomy/ rt abd drains, sig output noted from abd drain; hazy yellow fluid/urine in abd drain; site mildly tender to palpation  Imaging: Ct Abdomen Pelvis Wo Contrast  Result Date: 10/04/2017 CLINICAL DATA:  History of cystectomy with ileal conduit and urinary diversion. Status post ureteral stent removal with development of abdominal pain, low-grade fever and outside CT demonstrating abnormal fluid collections. EXAM: CT ABDOMEN AND PELVIS WITHOUT CONTRAST TECHNIQUE: Multidetector CT imaging of the abdomen and pelvis was performed following the standard protocol without IV contrast. COMPARISON:  05/24/2017 FINDINGS: Lower chest: Bibasilar atelectasis.  Hepatobiliary: No focal liver abnormality is seen. Status post cholecystectomy. No biliary dilatation. Pancreas: Unremarkable. No pancreatic ductal dilatation or surrounding inflammatory changes. Spleen: Normal in size without focal abnormality. Adrenals/Urinary Tract: Excreted contrast material is noted in both collecting systems of the kidneys and ureters. The kidneys do not demonstrate significant hydronephrosis. As the ureters are followed inferiorly, contrast is present in the ileal conduit as well as extravasated contrast immediately anterior to the conduit in the peritoneal cavity. There are also multiple additional high density fluid collections throughout the peritoneal cavity consistent with extravasated urine. At least 10 to 12 additional areas of high attenuation fluid are present. Stomach/Bowel: Bowel shows no evidence of obstruction or ileus. No free air identified. Ileostomy in the right lower quadrant and colostomy in the left lower quadrant appear unremarkable. Vascular/Lymphatic: No enlarged lymph nodes identified. The abdominal aorta is normal in caliber. Reproductive: The bladder has been removed. The prostate gland also appears likely to have been removed. Other: Small right inguinal hernia contains fat. Musculoskeletal: No acute or significant osseous findings. IMPRESSION: Multiple high attenuation areas of free fluid throughout the peritoneal cavity consistent with extravasated urine. The highest density collection is just anterior to the ileal conduit. Electronically Signed   By: Aletta Edouard M.D.   On: 10/04/2017 08:52   Ct Abdomen Pelvis W Contrast  Result Date: 10/06/2017 CLINICAL DATA:  History of prostate and bladder cancer, status post radical cystectomy and ileal conduit urinary diversion complicated by a rectal injury repaired with diverting colostomy. Abdominal distention. Recent percutaneous catheter drainage of urinoma EXAM: CT ABDOMEN AND  PELVIS WITH CONTRAST TECHNIQUE:  Multidetector CT imaging of the abdomen and pelvis was performed using the standard protocol following bolus administration of intravenous contrast. CONTRAST:  180mL ISOVUE-300 IOPAMIDOL (ISOVUE-300) INJECTION 61% COMPARISON:  09/03/2017 exams FINDINGS: Lower chest: Atelectasis with trace pleural effusions, stable in appearance. Heart size is top normal with coronary arteriosclerosis and aortic atherosclerosis. Hepatobiliary: Status post cholecystectomy. No biliary dilatation. Mild hepatic steatosis. Pancreas: Atrophic pancreas with scattered pancreatic calcifications that may reflect chronic pancreatitis. No acute inflammation, ductal dilatation or mass. Spleen: Normal size without focal abnormality. Adrenals/Urinary Tract: Normal bilateral adrenal glands. 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 No significant reaccumulation of fluid. The bladder has been resected. Stomach/Bowel: Left lower quadrant colostomy with scattered colonic diverticulosis along the distal descending and sigmoid colon. Moderate stool is noted within the colon leading up to the colostomy in the left hemipelvis. Stable subcutaneous soft tissue induration about the ileal conduit and left lower quadrant colostomy site. Vascular/Lymphatic: Aortic atherosclerosis. No enlarged abdominal or pelvic lymph nodes. Reproductive: Prostate is not visualized and appears to have been resected. Other: Small right-sided inguinal hernia containing fat. No free air. Musculoskeletal: No acute or significant osseous findings. IMPRESSION: 1. Status post percutaneous drainage urinoma in the right lower quadrant without reaccumulation of fluid noted. 2. 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 No bowel obstruction. No acute inflammation. 3. Stable trace pleural effusions and  atelectasis at each lung base. 4. Mild hepatic steatosis.  status post cholecystectomy. 5. 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 Electronically Signed   By: Ashley Royalty M.D.   On: 10/06/2017 16:59   Ct Image Guided Drainage By Percutaneous Catheter  Result Date: 10/04/2017 CLINICAL DATA:  Status post prior cystoprostatectomy and ileal conduit diversion for bladder carcinoma. 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 EXAM: CT GUIDED CATHETER DRAINAGE OF PERITONEAL URINOMA ANESTHESIA/SEDATION: 2.0 mg IV Versed 100 mcg IV Fentanyl Total Moderate Sedation Time:  22 minutes The patient's level of consciousness and physiologic status were continuously monitored during the procedure by Radiology nursing. PROCEDURE: The procedure, risks, benefits, and alternatives were explained to the patient. Questions regarding the procedure were encouraged and answered. The patient understands and consents to the procedure. A time out was performed prior to initiating the procedure. The right anterior abdominal wall was prepped with chlorhexidine in a sterile fashion, and a sterile drape was applied covering the operative field. A sterile gown and sterile gloves were used for the procedure. Local anesthesia was provided with 1% Lidocaine. CT was performed through the lower abdomen and upper pelvis in a supine position. 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 Aspiration of fluid was performed through the needle. A guidewire was then advanced into the collection. The tract was dilated and a 10 French percutaneous drainage catheter advanced. The catheter was formed and connected to a gravity drainage bag. A urine sample was sent for culture analysis. The catheter was secured at skin with a Prolene retention suture. COMPLICATIONS: None  FINDINGS: The highest density pocket of extravasated urine containing contrast material lying anterior to the ileal conduit was targeted. There was return of turbid urine which was sent for culture analysis. After placement of the  10 French drainage catheter and connection to a gravity drainage bag, there was prompt return of urine. 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 Urine return was turbid and a sample sent for culture analysis. A 10 French drainage catheter was placed and connected to gravity bag drainage. Electronically Signed   By: Aletta Edouard M.D.   On: 10/04/2017 15:06   Dg Outside Films Chest  Result Date: 10/04/2017 This examination belongs to an outside facility and is stored here for comparison purposes only.  Contact the originating outside institution for any associated report or interpretation.  Ct Outside Films Body  Result Date: 10/04/2017 This examination belongs to an outside facility and is stored here for comparison purposes only.  Contact the originating outside institution for any associated report or interpretation.   Labs:  CBC: Recent Labs    10/04/17 0522 10/05/17 0602 10/06/17 0431 10/07/17 0456  WBC 18.4* 12.9* 14.9* 14.9*  HGB 11.6* 9.4* 9.6* 10.3*  HCT 36.6* 30.2* 31.0* 32.2*  PLT 303 252 252 312    COAGS: No results for input(s): INR, APTT in the last 8760 hours.  BMP: Recent Labs    10/04/17 0522 10/05/17 0602 10/06/17 0431 10/06/17 1333 10/07/17 0456  NA 140 137  --  137 141  K 4.2 3.5  --  3.1* 3.2*  CL 112* 107  --  107 112*  CO2 20* 23  --  23 20*  GLUCOSE 118* 107*  --  99 107*  BUN 23* 19  --  17 19  CALCIUM 8.2* 7.8*  --  7.9* 8.1*  CREATININE 1.66* 0.99 1.03 0.90 0.85  GFRNONAA 42* >60 >60 >60 >60  GFRAA 48* >60 >60 >60 >60    LIVER FUNCTION TESTS: Recent Labs    10/04/17 0522  10/05/17 0602 10/07/17 0456  BILITOT 0.7 0.8 0.6  AST 28 16 14*  ALT 31 21 15*  ALKPHOS 67 61 64  PROT 6.5 5.6* 5.9*  ALBUMIN 2.7* 2.1* 2.3*    Assessment and Plan: Bladder/prostate cancer, s/p cystoprostatectomy/ileal conduit, colostomy after rectal injury 08/08/17; now with delayed urine leak, s/p urinoma drain placement on 10/04/17; K 3.2,creat 0.85, WBC 14.9, hgb 10.3, plts 312k, PT/INR pend; urinoma cx pend; request now received for left PCN for urinary diversion to hopefully expedite healing of leak; case has been reviewed by Dr. Pascal Lux. Risks and benefits of procedure were discussed with the patient/wife including, but not limited to, infection, bleeding, significant bleeding causing loss or decrease in renal function or damage to adjacent structures.   All of the patient's questions were answered, patient is agreeable to proceed.  Consent signed and in chart.  Procedure tent scheduled for later this afternoon    Electronically Signed: D. Rowe Robert, PA-C 10/07/2017, 9:53 AM   I spent a total of 20 minutes at the the patient's bedside AND on the patient's hospital floor or unit, greater than 50% of which was counseling/coordinating care for left percutaneous nephrostomy    Patient ID: Kristopher Thompson, DDS, male   DOB: June 10, 1951, 66 y.o.   MRN: [id number]

## 2017-10-07 NOTE — Sedation Documentation (Signed)
Vital signs stable. 

## 2017-10-07 NOTE — Procedures (Signed)
Pre Procedure Dx: Hydronephrosis Post Procedural Dx: Same  Successful [place] and fluoroscopic guided placement of a left sided PCN with end coiled and locked within the renal pelvis.  PCN connected to gravity bag.  Contrast injection demonstrates extravasation of contrast from the distal end of the left ureter, communicating with the percutaneous drainage catheter.  EBL: Minimal  Complications: None immediate.  Ronny Bacon, MD Pager #: [phone]

## 2017-10-08 LAB — CBC
HCT: 30.3 % — ABNORMAL LOW (ref 39.0–52.0)
Hemoglobin: 9.9 g/dL — ABNORMAL LOW (ref 13.0–17.0)
MCH: 25.3 pg — ABNORMAL LOW (ref 26.0–34.0)
MCHC: 32.7 g/dL (ref 30.0–36.0)
MCV: 77.3 fL — AB (ref 78.0–100.0)
PLATELETS: 273 10*3/uL (ref 150–400)
RBC: 3.92 MIL/uL — ABNORMAL LOW (ref 4.22–5.81)
RDW: 15.1 % (ref 11.5–15.5)
WBC: 11.8 10*3/uL — AB (ref 4.0–10.5)

## 2017-10-08 LAB — BASIC METABOLIC PANEL
ANION GAP: 7 (ref 5–15)
BUN: 13 mg/dL (ref 6–20)
CALCIUM: 7.4 mg/dL — AB (ref 8.9–10.3)
CO2: 24 mmol/L (ref 22–32)
Chloride: 109 mmol/L (ref 101–111)
Creatinine, Ser: 0.9 mg/dL (ref 0.61–1.24)
GLUCOSE: 101 mg/dL — AB (ref 65–99)
POTASSIUM: 2.8 mmol/L — AB (ref 3.5–5.1)
Sodium: 140 mmol/L (ref 135–145)

## 2017-10-08 MED ORDER — LIP MEDEX EX OINT
1.0000 "application " | TOPICAL_OINTMENT | CUTANEOUS | Status: DC | PRN
  Filled 2017-10-08: qty 7

## 2017-10-08 MED ORDER — PROMETHAZINE HCL 25 MG/ML IJ SOLN
12.5000 mg | Freq: Four times a day (QID) | INTRAMUSCULAR | Status: DC | PRN
  Administered 2017-10-08 – 2017-10-10 (×4): 12.5 mg via INTRAVENOUS
  Filled 2017-10-08 (×4): qty 1

## 2017-10-08 MED ORDER — PANTOPRAZOLE SODIUM 40 MG PO TBEC
40.0000 mg | DELAYED_RELEASE_TABLET | Freq: Every day | ORAL | Status: DC
  Administered 2017-10-08 – 2017-10-14 (×7): 40 mg via ORAL
  Filled 2017-10-08 (×6): qty 1

## 2017-10-08 MED ORDER — PHENOL 1.4 % MT LIQD
1.0000 | OROMUCOSAL | Status: DC | PRN
  Administered 2017-10-08: 1 via OROMUCOSAL
  Filled 2017-10-08 (×2): qty 177

## 2017-10-08 NOTE — Progress Notes (Signed)
Patient ID: Kristopher Thompson, DDS, male   DOB: 12/12/1950, 66 y.o.   MRN: [id number]    Subjective: Pt doing well.  Feeling better after left nephrostomy tube placed..  No nausea or vomiting overnight.    Objective: Vital signs in last 24 hours: Temp:  [98.3 F (36.8 C)-99.1 F (37.3 C)] 98.8 F (37.1 C) (11/27 0528) Pulse Rate:  [74-93] 78 (11/27 0528) Resp:  [18-28] 18 (11/27 0528) BP: (119-138)/(67-87) 125/73 (11/27 0528) SpO2:  [96 %-100 %] 96 % (11/27 0528)  Intake/Output from previous day: 11/26 0701 - 11/27 0700 In: 2596.7 [I.V.:2396.7; IV Piggyback:200] Out: 4193 [Urine:1600; Drains:175]  -  About half of UOP from nephrostomy and half from urostomy.  Drain output about 20 cc since PCN placed. Intake/Output this shift: No intake/output data recorded.  Physical Exam:  General: Alert and oriented CV: RRR Lungs: Clear Abdomen: Soft, ND, positive BS, Wound still granulating well, Colostomy with air in bag, Urostomy pink and draining clear urine.  Drain with minimal drainage, L PCN with clear urine. Ext: NT, No erythema  Lab Results: Recent Labs    10/06/17 0431 10/07/17 0456 10/08/17 0510  HGB 9.6* 10.3* 9.9*  HCT 31.0* 32.2* 30.3*   Lab Results  Component Value Date   WBC 11.8 (H) 10/08/2017     BMET Recent Labs    10/06/17 1333 10/07/17 0456  NA 137 141  K 3.1* 3.2*  CL 107 112*  CO2 23 20*  GLUCOSE 99 107*  BUN 17 19  CREATININE 0.90 0.85  CALCIUM 7.9* 8.1*     Studies/Results: Ct Abdomen Pelvis W Contrast  Result Date: 10/06/2017 CLINICAL DATA:  History of prostate and bladder cancer, status post radical cystectomy and ileal conduit urinary diversion complicated by a rectal injury repaired with diverting colostomy. Abdominal distention. Recent percutaneous catheter drainage of urinoma EXAM: CT ABDOMEN AND PELVIS WITH CONTRAST TECHNIQUE: Multidetector CT imaging of the abdomen and pelvis was performed using the standard protocol following bolus  administration of intravenous contrast. CONTRAST:  111mL ISOVUE-300 IOPAMIDOL (ISOVUE-300) INJECTION 61% COMPARISON:  09/03/2017 exams FINDINGS: Lower chest: Atelectasis with trace pleural effusions, stable in appearance. Heart size is top normal with coronary arteriosclerosis and aortic atherosclerosis. Hepatobiliary: Status post cholecystectomy. No biliary dilatation. Mild hepatic steatosis. Pancreas: Atrophic pancreas with scattered pancreatic calcifications that may reflect chronic pancreatitis. No acute inflammation, ductal dilatation or mass. Spleen: Normal size without focal abnormality. Adrenals/Urinary Tract: Normal bilateral adrenal glands. 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 No significant reaccumulation of fluid. The bladder has been resected. Stomach/Bowel: Left lower quadrant colostomy with scattered colonic diverticulosis along the distal descending and sigmoid colon. Moderate stool is noted within the colon leading up to the colostomy in the left hemipelvis. Stable subcutaneous soft tissue induration about the ileal conduit and left lower quadrant colostomy site. Vascular/Lymphatic: Aortic atherosclerosis. No enlarged abdominal or pelvic lymph nodes. Reproductive: Prostate is not visualized and appears to have been resected. Other: Small right-sided inguinal hernia containing fat. No free air. Musculoskeletal: No acute or significant osseous findings. IMPRESSION: 1. Status post percutaneous drainage urinoma in the right lower quadrant without reaccumulation of fluid noted. 2. 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 No bowel obstruction. No acute inflammation. 3. Stable trace pleural effusions and atelectasis at each lung base. 4. Mild hepatic steatosis.  status post cholecystectomy. 5. 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 Electronically Signed   By: Ashley Royalty M.D.   On: 10/06/2017 16:59   Ir Nephrostomy Placement Left  Result Date: 10/07/2017 INDICATION: History of bladder cancer, post radical cystectomy with ileal conduit creation, now with urine leak presumably secondary to injury involving the distal aspect the left ureter. Patient underwent CT-guided drainage of urinoma on 10/06/2017 by Dr. Kathlene Cote and presents now for ultrasound and fluoroscopic guided left-sided nephrostomy catheter placement and potential antegrade nephrostogram for urinary diversion purposes. EXAM: 1. ULTRASOUND GUIDANCE FOR PUNCTURE OF THE LEFT RENAL COLLECTING SYSTEM 2. LEFT PERCUTANEOUS NEPHROSTOMY TUBE PLACEMENT. COMPARISON:  CT abdomen and pelvis - 10/04/2017 ; 10/06/2017; CT-guided percutaneous drainage catheter placement - 10/04/2017 MEDICATIONS: Patient is currently admitted to the hospital and receiving intravenous antibiotics; The antibiotic was administered in an appropriate time frame prior to skin puncture. ANESTHESIA/SEDATION: Moderate (conscious) sedation was employed during this procedure. A total of Versed 5.5 mg and Fentanyl 175 mcg was administered intravenously. Moderate Sedation Time: 30 minutes. The patient's level of consciousness and vital signs were monitored continuously by radiology nursing throughout the procedure under my direct supervision. CONTRAST:  25 mL Isovue 300 administered into the collecting system FLUOROSCOPY TIME:  7 minutes (510 mGy) COMPLICATIONS: None immediate. PROCEDURE: The procedure, risks, benefits, and alternatives were explained to the patient. Questions regarding the procedure were encouraged and answered. The patient understands and consents to the procedure. A timeout was performed prior to the initiation of the procedure. The left flank region was prepped with Betadine in a sterile fashion, and a sterile drape was applied covering the operative  field. A sterile gown and sterile gloves were used for the procedure. Local anesthesia was provided with 1% Lidocaine with epinephrine. Ultrasound was used to localize the left kidney. Under direct ultrasound guidance, a 21 gauge needle was advanced into the renal collecting system. An ultrasound image documentation was performed. Access within the collecting system was confirmed with the efflux of urine followed by contrast injection. With the left renal collecting system apically opacified, a posterior inferior calyx was targeted fluoroscopically with a 22 gauge Chiba needle after the overlying soft tissues were anesthetized with 1% lidocaine with epinephrine. Access to the collecting system was confirmed with advancement of a Nitrex tracks wire into the left renal collecting system to the superior aspect the left ureter. The track was dilated with an Accustick set. Over a short Amplatz wire, a 8 French radiopaque tip vascular sheath was advanced through the collecting systems the superior aspect the left ureter. Next, a Kumpe catheter was utilized to perform a distal left nephrostogram. Attempts were made to traverse the area of extravasation/injury involving the distal aspect the left ureter, however this ultimately proved unsuccessful. Next, under intermittent fluoroscopic guidance, the vascular sheath was exchanged for a 10 French percutaneous nephrostomy catheter with end ultimately coiled and locked within the left renal pelvis. Contrast was injected and several sport radiographs were obtained in various obliquities confirming access. The catheter was secured at the skin with a Prolene retention suture and a gravity bag was placed. A dressing was placed. The patient tolerated procedure well without immediate postprocedural complication. FINDINGS: Ultrasound scanning demonstrates a mildly dilated left-sided renal collecting system, similar preceding abdominal CT. Under direct ultrasound guidance, the left  renal collecting system was cannulated however initial access demonstrated a suspected puncture at the level of the infundibulum. As such, a opacified posterior inferior calyx was targeted fluoroscopically allowing placement of a vascular sheath  to the level of the superior aspect of the left ureter. Distal nephrostogram demonstrates extravasation of contrast at the distal aspect of the left ureter regional to the anastomosis. There is extravasation of contrast into the adjacent peritoneum communicating with the end of the percutaneous drainage catheter. Attempts were made to traverse the distal left ureteral anastomotic injury however this ultimately proved unsuccessful. Successful placement of a 10 French percutaneous nephrostomy catheter with end coiled and locked within left renal pelvis. Contrast injection confirmed appropriate positioning. IMPRESSION: 1. Successful ultrasound and fluoroscopic guided placement of a left sided 10 French PCN. 2. Antegrade nephrostogram demonstrates a distal left ureteral injury with extravasation of contrast communicating with the percutaneous drainage catheter. PLAN: Would recommend urinary diversion via external drainage for a minimal of 2 weeks prior to repeated attempted nephroureteral stent placement. Electronically Signed   By: Sandi Mariscal M.D.   On: 10/07/2017 14:55    Assessment/Plan: Delayed urine leak s/p radical cystectomy and ileal conduit on 08/08/17.  Pt improved after left PCN placed yesterday.  Nephrostogram confirmed leak from left ureteroenteric anastomotic site.  Will continue to monitor drain output and keep L PCN to drainage.  Would continue broad spectrum IV antibiotics for 7 day course with cultures still pending but unlikely to be helpful.  If no obvious clear source, would recommend discontinuing antibiotics after one week (maybe Thursday).  If drain output continues to decrease, will plan to let go home with L PCN and percutaneous abdominal drain in  place with plans for repeat nephrostogram in about 2 weeks with IR with possible attempt at placement of nephroureteral catheter across anastomotic site.  Will advance diet today as well.  May benefit from PT eval for pending discharge in a couple of days.   LOS: 4 days   Wanette Robison,LES 10/08/2017, 7:54 AM

## 2017-10-08 NOTE — Progress Notes (Signed)
PT Cancellation Note  Patient Details Name: Kristopher Thompson, DDS MRN: [id number] DOB: October 12, 1951   Cancelled Treatment:    Reason Eval/Treat Not Completed: Attempted PT eval. Pt and wife declined particiipation. Both report pt has an elevated temp at this time. Will check back another day.  Thanks.    Weston Anna, MPT Pager: [phone]

## 2017-10-08 NOTE — Progress Notes (Signed)
Referring Physician(s): Borden,L  Supervising Physician: Sandi Mariscal  Patient Status:  [facility] - In-pt  Chief Complaint:  Post op urine leak  Subjective: Pt resting quietly in bed; denies N/V or sig abd pain; has had fever, 102.9 at 1400 today   Allergies: Demerol [meperidine]  Medications: Prior to Admission medications   Medication Sig Start Date End Date Taking? Authorizing Provider  acetaminophen (TYLENOL) 500 MG tablet Take 1,000 mg by mouth daily as needed for mild pain. For pain   Yes [provider]  docusate sodium (COLACE) 100 MG capsule Take 2 capsules (200 mg total) by mouth 2 (two) times daily. 08/14/17 08/14/18 Yes McCormickLenna Sciara, MD  esomeprazole (NEXIUM) 20 MG capsule Take 20 mg by mouth daily as needed (heartburn).   Yes [provider]  HYDROcodone-acetaminophen (NORCO/VICODIN) 5-325 MG tablet Take 1-2 tablets by mouth every 6 (six) hours as needed for moderate pain (pain). 08/14/17  Yes Stasia Cavalier, MD     Vital Signs: BP (!) 158/67 (BP Location: Left Arm)   Pulse 86   Temp (!) 102.9 F (39.4 C) (Oral)   Resp 18   Ht 6' (1.829 m)   Wt 252 lb 13.9 oz (114.7 kg)   SpO2 96%   BMI 34.29 kg/m   Physical Exam awake/alert; left PCN intact, output 225 cc yellow urine ; rt abd drain intact, site ok, output 175 cc yellow urine  Imaging: Ct Abdomen Pelvis W Contrast  Result Date: 10/06/2017 CLINICAL DATA:  History of prostate and bladder cancer, status post radical cystectomy and ileal conduit urinary diversion complicated by a rectal injury repaired with diverting colostomy. Abdominal distention. Recent percutaneous catheter drainage of urinoma EXAM: CT ABDOMEN AND PELVIS WITH CONTRAST TECHNIQUE: Multidetector CT imaging of the abdomen and pelvis was performed using the standard protocol following bolus administration of intravenous contrast. CONTRAST:  12mL ISOVUE-300 IOPAMIDOL (ISOVUE-300) INJECTION 61% COMPARISON:   09/03/2017 exams FINDINGS: Lower chest: Atelectasis with trace pleural effusions, stable in appearance. Heart size is top normal with coronary arteriosclerosis and aortic atherosclerosis. Hepatobiliary: Status post cholecystectomy. No biliary dilatation. Mild hepatic steatosis. Pancreas: Atrophic pancreas with scattered pancreatic calcifications that may reflect chronic pancreatitis. No acute inflammation, ductal dilatation or mass. Spleen: Normal size without focal abnormality. Adrenals/Urinary Tract: Normal bilateral adrenal glands. 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 No significant reaccumulation of fluid. The bladder has been resected. Stomach/Bowel: Left lower quadrant colostomy with scattered colonic diverticulosis along the distal descending and sigmoid colon. Moderate stool is noted within the colon leading up to the colostomy in the left hemipelvis. Stable subcutaneous soft tissue induration about the ileal conduit and left lower quadrant colostomy site. Vascular/Lymphatic: Aortic atherosclerosis. No enlarged abdominal or pelvic lymph nodes. Reproductive: Prostate is not visualized and appears to have been resected. Other: Small right-sided inguinal hernia containing fat. No free air. Musculoskeletal: No acute or significant osseous findings. IMPRESSION: 1. Status post percutaneous drainage urinoma in the right lower quadrant without reaccumulation of fluid noted. 2. 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 No bowel obstruction. No acute inflammation. 3. Stable trace pleural effusions and atelectasis at each lung base. 4. Mild hepatic steatosis.  status post cholecystectomy. 5. 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 Electronically Signed   By: Ashley Royalty M.D.   On: 10/06/2017 16:59    Ir  Nephrostomy Placement Left  Result Date: 10/07/2017 INDICATION: History of bladder cancer, post radical cystectomy with ileal conduit creation, now with urine leak presumably secondary to injury involving the distal aspect the left ureter. Patient underwent CT-guided drainage of urinoma on 10/06/2017 by Dr. Kathlene Cote and presents now for ultrasound and fluoroscopic guided left-sided nephrostomy catheter placement and potential antegrade nephrostogram for urinary diversion purposes. EXAM: 1. ULTRASOUND GUIDANCE FOR PUNCTURE OF THE LEFT RENAL COLLECTING SYSTEM 2. LEFT PERCUTANEOUS NEPHROSTOMY TUBE PLACEMENT. COMPARISON:  CT abdomen and pelvis - 10/04/2017 ; 10/06/2017; CT-guided percutaneous drainage catheter placement - 10/04/2017 MEDICATIONS: Patient is currently admitted to the hospital and receiving intravenous antibiotics; The antibiotic was administered in an appropriate time frame prior to skin puncture. ANESTHESIA/SEDATION: Moderate (conscious) sedation was employed during this procedure. A total of Versed 5.5 mg and Fentanyl 175 mcg was administered intravenously. Moderate Sedation Time: 30 minutes. The patient's level of consciousness and vital signs were monitored continuously by radiology nursing throughout the procedure under my direct supervision. CONTRAST:  25 mL Isovue 300 administered into the collecting system FLUOROSCOPY TIME:  7 minutes (782 mGy) COMPLICATIONS: None immediate. PROCEDURE: The procedure, risks, benefits, and alternatives were explained to the patient. Questions regarding the procedure were encouraged and answered. The patient understands and consents to the procedure. A timeout was performed prior to the initiation of the procedure. The left flank region was prepped with Betadine in a sterile fashion, and a sterile drape was applied covering the operative field. A sterile gown and sterile gloves were used for the procedure. Local anesthesia was provided with 1% Lidocaine  with epinephrine. Ultrasound was used to localize the left kidney. Under direct ultrasound guidance, a 21 gauge needle was advanced into the renal collecting system. An ultrasound image documentation was performed. Access within the collecting system was confirmed with the efflux of urine followed by contrast injection. With the left renal collecting system apically opacified, a posterior inferior calyx was targeted fluoroscopically with a 22 gauge Chiba needle after the overlying soft tissues were anesthetized with 1% lidocaine with epinephrine. Access to the collecting system was confirmed with advancement of a Nitrex tracks wire into the left renal collecting system to the superior aspect the left ureter. The track was dilated with an Accustick set. Over a short Amplatz wire, a 8 French radiopaque tip vascular sheath was advanced through the collecting systems the superior aspect the left ureter. Next, a Kumpe catheter was utilized to perform a distal left nephrostogram. Attempts were made to traverse the area of extravasation/injury involving the distal aspect the left ureter, however this ultimately proved unsuccessful. Next, under intermittent fluoroscopic guidance, the vascular sheath was exchanged for a 10 French percutaneous nephrostomy catheter with end ultimately coiled and locked within the left renal pelvis. Contrast was injected and several sport radiographs were obtained in various obliquities confirming access. The catheter was secured at the skin with a Prolene retention suture and a gravity bag was placed. A dressing was placed. The patient tolerated procedure well without immediate postprocedural complication. FINDINGS: Ultrasound scanning demonstrates a mildly dilated left-sided renal collecting system, similar preceding abdominal CT. Under direct ultrasound guidance, the left renal collecting system was cannulated however initial access demonstrated a suspected puncture at the level of the  infundibulum. As such, a opacified posterior inferior calyx was targeted fluoroscopically allowing placement of a vascular sheath to the level of the superior aspect of the left ureter. Distal nephrostogram demonstrates extravasation of contrast at the distal aspect of the left ureter  regional to the anastomosis. There is extravasation of contrast into the adjacent peritoneum communicating with the end of the percutaneous drainage catheter. Attempts were made to traverse the distal left ureteral anastomotic injury however this ultimately proved unsuccessful. Successful placement of a 10 French percutaneous nephrostomy catheter with end coiled and locked within left renal pelvis. Contrast injection confirmed appropriate positioning. IMPRESSION: 1. Successful ultrasound and fluoroscopic guided placement of a left sided 10 French PCN. 2. Antegrade nephrostogram demonstrates a distal left ureteral injury with extravasation of contrast communicating with the percutaneous drainage catheter. PLAN: Would recommend urinary diversion via external drainage for a minimal of 2 weeks prior to repeated attempted nephroureteral stent placement. Electronically Signed   By: Sandi Mariscal M.D.   On: 10/07/2017 14:55    Labs:  CBC: Recent Labs    10/05/17 0602 10/06/17 0431 10/07/17 0456 10/08/17 0510  WBC 12.9* 14.9* 14.9* 11.8*  HGB 9.4* 9.6* 10.3* 9.9*  HCT 30.2* 31.0* 32.2* 30.3*  PLT 252 252 312 273    COAGS: Recent Labs    10/07/17 0947  INR 1.18    BMP: Recent Labs    10/04/17 0522 10/05/17 0602 10/06/17 0431 10/06/17 1333 10/07/17 0456  NA 140 137  --  137 141  K 4.2 3.5  --  3.1* 3.2*  CL 112* 107  --  107 112*  CO2 20* 23  --  23 20*  GLUCOSE 118* 107*  --  99 107*  BUN 23* 19  --  17 19  CALCIUM 8.2* 7.8*  --  7.9* 8.1*  CREATININE 1.66* 0.99 1.03 0.90 0.85  GFRNONAA 42* >60 >60 >60 >60  GFRAA 48* >60 >60 >60 >60    LIVER FUNCTION TESTS: Recent Labs    10/04/17 0522  10/05/17 0602 10/07/17 0456  BILITOT 0.7 0.8 0.6  AST 28 16 14*  ALT 31 21 15*  ALKPHOS 67 61 64  PROT 6.5 5.6* 5.9*  ALBUMIN 2.7* 2.1* 2.3*    Assessment and Plan: Bladder/prostate cancer, s/p cystoprostatectomy/ileal conduit, colostomy after rectal injury 08/08/17; now with delayed urine leak, s/p urinoma drain placement on 10/04/17; s/p left PCN 10/07/17; temp 102.9; WBC 11.8 (14.9), hgb 9.9, creat nl; blood cx- enterobacter/klebsiella- on zosyn; abd fluid cx - mult organisms;pharm managing antbx; resend urine for cx from both drain sites; monitor labs closely; as per urology, tent plans are for left nephrostogram in 2 weeks with poss NU cath placement; OOB-PT; incentive spirometry.    Electronically Signed: D. Rowe Robert, PA-C 10/08/2017, 2:30 PM   I spent a total of 15 minutes  at the the patient's bedside AND on the patient's hospital floor or unit, greater than 50% of which was counseling/coordinating care for abdominal drain/left nephrostomy    Patient ID: Kristopher Thompson, DDS, male   DOB: [birth year], 66 y.o.   MRN: [id number]

## 2017-10-08 NOTE — Progress Notes (Signed)
Patient ID: Kristopher Thompson, DDS, male   DOB: 10/02/1951, 66 y.o.   MRN: [id number]  Pt with fever this afternoon to 102. Also with increased nausea today.  Abd - soft, positive BS  Culture resent from abdominal drain and L PCN per IR.  Continue Zosyn.  Reviewed cultures and aware of Klebsiella blood culture.  Will check CBC in AM to follow WBC.

## 2017-10-08 NOTE — Progress Notes (Addendum)
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 male with hx prostate and bladder cancer (s/p ofdiverting colostomy in place for rectal repair 08/08/2017 and S/P ileal conduit), transferred from [facility] to [facility] on 11/22 for management of BL hydronephrosis and suspected abscess.  Vancomycin and zosyn started on admission for infection.   - 11/23: percutaneous catheter drainage of urinoma - 11/26 L PCN  Today, 10/08/2017: - day #5 abx - AF, wbc down 11.8 -  scr down 0.85 - 11/23 1/2 bcx with GNR, BCID showed klebsiella pneum. -11/25 BC ngtd  Plan: - - zosyn 3.375 gm IV q8h (infuse over 4 hrs) -plan to continue zosyn while in hospital and DC home on Cipro to complete 2 weeks abx for Klebsiella bacteremia _________________________________________  Height: 6' (182.9 cm) Weight: 252 lb 13.9 oz (114.7 kg) IBW/kg (Calculated) : 77.6  Temp (24hrs), Avg:98.7 F (37.1 C), Min:98.3 F (36.8 C), Max:99.1 F (37.3 C)  Recent Labs  Lab 10/04/17 0522 10/04/17 0813 10/05/17 0602 10/06/17 0431 10/06/17 1333 10/07/17 0456 10/08/17 0510  WBC 18.4*  --  12.9* 14.9*  --  14.9* 11.8*  CREATININE 1.66*  --  0.99 1.03 0.90 0.85  --   LATICACIDVEN 1.7 2.0* 1.3  --   --   --   --     Estimated Creatinine Clearance: 113.2 mL/min (by C-G formula based on SCr of 0.85 mg/dL).    Allergies  Allergen Reactions  . Demerol [Meperidine] Other (See Comments)    SEVERE NAUSEA    Antimicrobials this admission:  11/23 Vancomycin  >>11/26 11/23 Zosyn  >>   Dose adjustments this admission:  11/24: changed vanc from 1250 q24h to 1gm q12h (est AUC 459 with scr 0.99)  Microbiology results:  11/23 BCx x2: 1/2 GNR (BCID= klebsiella pneum) 11/23 UCx: multi species FINAL 11/23 abscess: abundant Gram variable rods, few GPC in pairs and chains 11/25 BCx2>>ngtd  Thank you for allowing pharmacy to be a part of this patient's care. Eudelia Bunch, Pharm.D. [phone] 10/08/2017  8:15 AM

## 2017-10-08 NOTE — Progress Notes (Signed)
PROGRESS NOTE    Kristopher Thompson, DDS  PIR:[id number] DOB: 05-09-1951 DOA: 10/04/2017 PCP: Lorene Dy, MD   Brief Narrative: Kristopher Thompson a66 y.o.male,..With history of prostate cancer, bladder cancer status post radical cystectomy and ileal conduit urinary diversion on 05/11/1600, complicated by rectal injury which was repaired by general surgery status post diverting colostomy came to hospital with complaints of abdominal pain for past 2 days. 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 Urology and surgery consulted and on board. He also underwent PCN on the left by IR on 11/26. Pt continues to have fevers, on broad spectrum antibiotics. Will request ID consult in am.     Assessment & Plan:   Active Problems:   Sepsis ([place])   History of bladder cancer status post radical cystectomy ileal conduit, urinary diversion on 0/93 complicated by a rectal injury status post diverting colostomy.  Removal of ureteral stents on 11/20. Status post percutaneous percutaneous catheter drainage of urinoma on 11/23.  Continues to drain clear urine.  Urology and IR on board Pain control as needed, ambulate out of bed. On 11/25, patient had persistent nausea and vomiting all day ordered CT abdomen pelvis with IV contrast, did not show any obstruction, or acute pathology.  Urology  Suggested the symptoms could be secondary to leak from the left ureteroenteric anastomotic site and  recommended percutaneous nephrostomy by IR. Nephrostogram confirmed the leak.   Appreciate urology recommendations.  Patient continues to spike fevers, despite being on IV zosyn. Will request ID consult as he continues to have fevers.    Klebsiella bacteremia Received 4 days of IV vancomycin and IV zosyn, d/c IV vancomycin as cultures grew klebsiella.  Resume zosyn.    Anemia of chronic disease Hemoglobin between 9-10   Acute renal failure Probably secondary to sepsis and  dehydration.  After appropriate hydration with normal saline creatinine is back at baseline.  Hypokalemia replaced. Repeat today.   Hypophosphatemia replaced    DVT prophylaxis: Lovenox Code Status: Full code Family Communication: Family at bedside Disposition Plan: Pending resolution of nausea, and fever.   Consultants:   IR  Urology  Surgery   Procedures: Percutaneous drain placement by IR  Antimicrobials: IV vancomycin and Zosyn since admission  Subjective: Able to tolerate clears, advanced diet.   Objective: Vitals:   10/08/17 1359 10/08/17 1514 10/08/17 1625 10/08/17 1808  BP: (!) 158/67     Pulse: 86     Resp: 18     Temp: (!) 102.9 F (39.4 C) (!) 101 F (38.3 C) (!) 100.6 F (38.1 C) 100 F (37.8 C)  TempSrc: Oral Oral Oral Oral  SpO2: 96%     Weight:      Height:        Intake/Output Summary (Last 24 hours) at 10/08/2017 1837 Last data filed at 10/08/2017 1809 Gross per 24 hour  Intake 2076.66 ml  Output 2530 ml  Net -453.34 ml   Filed Weights   10/04/17 0111  Weight: 114.7 kg (252 lb 13.9 oz)    Examination:  General exam: Nausea better, calm today.  Respiratory system: diminished at bases, no wheezing or rhonchi.  Cardiovascular system: S1 & S2 heard, RRR.  Trace pedal edema. Gastrointestinal system:abd soft , non tender non distended ,bowelsounds okay.left PCN in place and draining.  Urostomy and colostomy present urostomy draining clear yellow urine.  Right lower quadrant percutaneous drain in place. Central nervous system: Alert and oriented.  Nonfocal. Extremities: Symmetric 5 x 5 power. Skin: No  rashes, lesions or ulcers Psychiatry: Mood & affect appropriate.     Data Reviewed: I have personally reviewed following labs and imaging studies  CBC: Recent Labs  Lab 10/04/17 0522 10/05/17 0602 10/06/17 0431 10/07/17 0456 10/08/17 0510  WBC 18.4* 12.9* 14.9* 14.9* 11.8*  HGB 11.6* 9.4* 9.6* 10.3* 9.9*  HCT 36.6* 30.2* 31.0*  32.2* 30.3*  MCV 79.7 81.2 81.6 78.3 77.3*  PLT 303 252 252 312 035   Basic Metabolic Panel: Recent Labs  Lab 10/04/17 0522 10/05/17 0602 10/06/17 0431 10/06/17 1333 10/07/17 0456  NA 140 137  --  137 141  K 4.2 3.5  --  3.1* 3.2*  CL 112* 107  --  107 112*  CO2 20* 23  --  23 20*  GLUCOSE 118* 107*  --  99 107*  BUN 23* 19  --  17 19  CREATININE 1.66* 0.99 1.03 0.90 0.85  CALCIUM 8.2* 7.8*  --  7.9* 8.1*  MG  --   --   --  2.0  --   PHOS  --   --   --  1.7*  --    GFR: Estimated Creatinine Clearance: 113.2 mL/min (by C-G formula based on SCr of 0.85 mg/dL). Liver Function Tests: Recent Labs  Lab 10/04/17 0522 10/05/17 0602 10/07/17 0456  AST 28 16 14*  ALT 31 21 15*  ALKPHOS 67 61 64  BILITOT 0.7 0.8 0.6  PROT 6.5 5.6* 5.9*  ALBUMIN 2.7* 2.1* 2.3*   No results for input(s): LIPASE, AMYLASE in the last 168 hours. No results for input(s): AMMONIA in the last 168 hours. Coagulation Profile: Recent Labs  Lab 10/07/17 0947  INR 1.18   Cardiac Enzymes: No results for input(s): CKTOTAL, CKMB, CKMBINDEX, TROPONINI in the last 168 hours. BNP (last 3 results) No results for input(s): PROBNP in the last 8760 hours. HbA1C: No results for input(s): HGBA1C in the last 72 hours. CBG: No results for input(s): GLUCAP in the last 168 hours. Lipid Profile: No results for input(s): CHOL, HDL, LDLCALC, TRIG, CHOLHDL, LDLDIRECT in the last 72 hours. Thyroid Function Tests: No results for input(s): TSH, T4TOTAL, FREET4, T3FREE, THYROIDAB in the last 72 hours. Anemia Panel: No results for input(s): VITAMINB12, FOLATE, FERRITIN, TIBC, IRON, RETICCTPCT in the last 72 hours. Sepsis Labs: Recent Labs  Lab 10/04/17 0522 10/04/17 0813 10/05/17 0602 10/06/17 0431  PROCALCITON 4.58  --  5.36 3.76  LATICACIDVEN 1.7 2.0* 1.3  --     Recent Results (from the past 240 hour(s))  Culture, blood (Routine X 2) w Reflex to ID Panel     Status: Abnormal   Collection Time: 10/04/17   5:33 AM  Result Value Ref Range Status   Specimen Description BLOOD LEFT ANTECUBITAL  Final   Special Requests   Final    BOTTLES DRAWN AEROBIC AND ANAEROBIC Blood Culture adequate volume   Culture  Setup Time   Final    GRAM NEGATIVE RODS AEROBIC BOTTLE ONLY CRITICAL RESULT CALLED TO, READ BACK BY AND VERIFIED WITH: J. GRIMSLEY, RPHARMD ([place]) AT 0308 ON 10/05/17 BY C. JESSUP, MLT. Performed at [facility], [place] [address], [place],  [address]    Culture KLEBSIELLA PNEUMONIAE (A)  Final   Report Status 10/07/2017 FINAL  Final   Organism ID, Bacteria KLEBSIELLA PNEUMONIAE  Final      Susceptibility   Klebsiella pneumoniae - MIC*    AMPICILLIN >=32 RESISTANT Resistant     CEFAZOLIN <=4 SENSITIVE Sensitive  CEFEPIME <=1 SENSITIVE Sensitive     CEFTAZIDIME <=1 SENSITIVE Sensitive     CEFTRIAXONE <=1 SENSITIVE Sensitive     CIPROFLOXACIN <=0.25 SENSITIVE Sensitive     GENTAMICIN <=1 SENSITIVE Sensitive     IMIPENEM <=0.25 SENSITIVE Sensitive     TRIMETH/SULFA <=20 SENSITIVE Sensitive     AMPICILLIN/SULBACTAM >=32 RESISTANT Resistant     PIP/TAZO 16 SENSITIVE Sensitive     Extended ESBL NEGATIVE Sensitive     * KLEBSIELLA PNEUMONIAE  Blood Culture ID Panel (Reflexed)     Status: Abnormal   Collection Time: 10/04/17  5:33 AM  Result Value Ref Range Status   Enterococcus species NOT DETECTED NOT DETECTED Final   Listeria monocytogenes NOT DETECTED NOT DETECTED Final   Staphylococcus species NOT DETECTED NOT DETECTED Final   Staphylococcus aureus NOT DETECTED NOT DETECTED Final   Streptococcus species NOT DETECTED NOT DETECTED Final   Streptococcus agalactiae NOT DETECTED NOT DETECTED Final   Streptococcus pneumoniae NOT DETECTED NOT DETECTED Final   Streptococcus pyogenes NOT DETECTED NOT DETECTED Final   Acinetobacter baumannii NOT DETECTED NOT DETECTED Final   Enterobacteriaceae species DETECTED (A) NOT DETECTED Final    Comment: Enterobacteriaceae represent a large  family of gram-negative bacteria, not a single organism. CRITICAL RESULT CALLED TO, READ BACK BY AND VERIFIED WITH: J. GRIMSLEY, RPHARMD ([place]) AT 0308 ON 10/05/17 BY C. JESSUP, MLT.    Enterobacter cloacae complex NOT DETECTED NOT DETECTED Final   Escherichia coli NOT DETECTED NOT DETECTED Final   Klebsiella oxytoca NOT DETECTED NOT DETECTED Final   Klebsiella pneumoniae DETECTED (A) NOT DETECTED Final    Comment: CRITICAL RESULT CALLED TO, READ BACK BY AND VERIFIED WITH: J. GRIMSLEY, RPHARMD ([place]) AT 0308 ON 10/05/17 BY C. JESSUP, MLT.    Proteus species NOT DETECTED NOT DETECTED Final   Serratia marcescens NOT DETECTED NOT DETECTED Final   Carbapenem resistance NOT DETECTED NOT DETECTED Final   Haemophilus influenzae NOT DETECTED NOT DETECTED Final   Neisseria meningitidis NOT DETECTED NOT DETECTED Final   Pseudomonas aeruginosa NOT DETECTED NOT DETECTED Final   Candida albicans NOT DETECTED NOT DETECTED Final   Candida glabrata NOT DETECTED NOT DETECTED Final   Candida krusei NOT DETECTED NOT DETECTED Final   Candida parapsilosis NOT DETECTED NOT DETECTED Final   Candida tropicalis NOT DETECTED NOT DETECTED Final    Comment: Performed at [facility], [place] [address] [place], [place] [address]  Culture, blood (Routine X 2) w Reflex to ID Panel     Status: None (Preliminary result)   Collection Time: 10/04/17  5:35 AM  Result Value Ref Range Status   Specimen Description BLOOD RIGHT ANTECUBITAL  Final   Special Requests   Final    BOTTLES DRAWN AEROBIC AND ANAEROBIC Blood Culture adequate volume   Culture   Final    NO GROWTH 4 DAYS Performed at [facility], [place] [address], [place], [place] [address]    Report Status PENDING  Incomplete  Culture, Urine     Status: Abnormal   Collection Time: 10/04/17  5:47 AM  Result Value Ref Range Status   Specimen Description URINE, RANDOM  Final   Special Requests NONE  Final   Culture MULTIPLE SPECIES PRESENT, SUGGEST  RECOLLECTION (A)  Final   Report Status 10/05/2017 FINAL  Final  Aerobic/Anaerobic Culture (surgical/deep wound)     Status: Abnormal (Preliminary result)   Collection Time: 10/04/17 12:19 PM  Result Value Ref Range Status  Specimen Description ABSCESS  Final   Special Requests Normal  Final   Gram Stain   Final    ABUNDANT WBC PRESENT,BOTH PMN AND MONONUCLEAR ABUNDANT GRAM VARIABLE ROD FEW GRAM POSITIVE COCCI IN PAIRS IN CHAINS    Culture (A)  Final    MULTIPLE ORGANISMS PRESENT, NONE PREDOMINANT NO ANAEROBES ISOLATED; CULTURE IN PROGRESS FOR 5 DAYS    Report Status PENDING  Incomplete  Culture, blood (Routine X 2) w Reflex to ID Panel     Status: None (Preliminary result)   Collection Time: 10/06/17  9:42 AM  Result Value Ref Range Status   Specimen Description BLOOD RIGHT ANTECUBITAL  Final   Special Requests   Final    BOTTLES DRAWN AEROBIC AND ANAEROBIC Blood Culture results may not be optimal due to an excessive volume of blood received in culture bottles   Culture   Final    NO GROWTH 2 DAYS Performed at [facility], [place] [address], [place], [place] [address]    Report Status PENDING  Incomplete  Culture, blood (Routine X 2) w Reflex to ID Panel     Status: None (Preliminary result)   Collection Time: 10/06/17  9:50 AM  Result Value Ref Range Status   Specimen Description BLOOD LEFT ANTECUBITAL  Final   Special Requests IN PEDIATRIC BOTTLE Blood Culture adequate volume  Final   Culture   Final    NO GROWTH 2 DAYS Performed at [facility], [place] [address], [place], [place] [address]    Report Status PENDING  Incomplete         Radiology Studies: Ir Nephrostomy Placement Left  Result Date: 10/07/2017 INDICATION: History of bladder cancer, post radical cystectomy with ileal conduit creation, now with urine leak presumably secondary to injury involving the distal aspect the left ureter. Patient underwent CT-guided drainage of urinoma on 10/06/2017 by  Dr. Kathlene Cote and presents now for ultrasound and fluoroscopic guided left-sided nephrostomy catheter placement and potential antegrade nephrostogram for urinary diversion purposes. EXAM: 1. ULTRASOUND GUIDANCE FOR PUNCTURE OF THE LEFT RENAL COLLECTING SYSTEM 2. LEFT PERCUTANEOUS NEPHROSTOMY TUBE PLACEMENT. COMPARISON:  CT abdomen and pelvis - 10/04/2017 ; 10/06/2017; CT-guided percutaneous drainage catheter placement - 10/04/2017 MEDICATIONS: Patient is currently admitted to the hospital and receiving intravenous antibiotics; The antibiotic was administered in an appropriate time frame prior to skin puncture. ANESTHESIA/SEDATION: Moderate (conscious) sedation was employed during this procedure. A total of Versed 5.5 mg and Fentanyl 175 mcg was administered intravenously. Moderate Sedation Time: 30 minutes. The patient's level of consciousness and vital signs were monitored continuously by radiology nursing throughout the procedure under my direct supervision. CONTRAST:  25 mL Isovue 300 administered into the collecting system FLUOROSCOPY TIME:  7 minutes (967 mGy) COMPLICATIONS: None immediate. PROCEDURE: The procedure, risks, benefits, and alternatives were explained to the patient. Questions regarding the procedure were encouraged and answered. The patient understands and consents to the procedure. A timeout was performed prior to the initiation of the procedure. The left flank region was prepped with Betadine in a sterile fashion, and a sterile drape was applied covering the operative field. A sterile gown and sterile gloves were used for the procedure. Local anesthesia was provided with 1% Lidocaine with epinephrine. Ultrasound was used to localize the left kidney. Under direct ultrasound guidance, a 21 gauge needle was advanced into the renal collecting system. An ultrasound image documentation was performed. Access within the collecting system was confirmed with the efflux of urine followed by  contrast  injection. With the left renal collecting system apically opacified, a posterior inferior calyx was targeted fluoroscopically with a 22 gauge Chiba needle after the overlying soft tissues were anesthetized with 1% lidocaine with epinephrine. Access to the collecting system was confirmed with advancement of a Nitrex tracks wire into the left renal collecting system to the superior aspect the left ureter. The track was dilated with an Accustick set. Over a short Amplatz wire, a 8 French radiopaque tip vascular sheath was advanced through the collecting systems the superior aspect the left ureter. Next, a Kumpe catheter was utilized to perform a distal left nephrostogram. Attempts were made to traverse the area of extravasation/injury involving the distal aspect the left ureter, however this ultimately proved unsuccessful. Next, under intermittent fluoroscopic guidance, the vascular sheath was exchanged for a 10 French percutaneous nephrostomy catheter with end ultimately coiled and locked within the left renal pelvis. Contrast was injected and several sport radiographs were obtained in various obliquities confirming access. The catheter was secured at the skin with a Prolene retention suture and a gravity bag was placed. A dressing was placed. The patient tolerated procedure well without immediate postprocedural complication. FINDINGS: Ultrasound scanning demonstrates a mildly dilated left-sided renal collecting system, similar preceding abdominal CT. Under direct ultrasound guidance, the left renal collecting system was cannulated however initial access demonstrated a suspected puncture at the level of the infundibulum. As such, a opacified posterior inferior calyx was targeted fluoroscopically allowing placement of a vascular sheath to the level of the superior aspect of the left ureter. Distal nephrostogram demonstrates extravasation of contrast at the distal aspect of the left ureter regional to the anastomosis.  There is extravasation of contrast into the adjacent peritoneum communicating with the end of the percutaneous drainage catheter. Attempts were made to traverse the distal left ureteral anastomotic injury however this ultimately proved unsuccessful. Successful placement of a 10 French percutaneous nephrostomy catheter with end coiled and locked within left renal pelvis. Contrast injection confirmed appropriate positioning. IMPRESSION: 1. Successful ultrasound and fluoroscopic guided placement of a left sided 10 French PCN. 2. Antegrade nephrostogram demonstrates a distal left ureteral injury with extravasation of contrast communicating with the percutaneous drainage catheter. PLAN: Would recommend urinary diversion via external drainage for a minimal of 2 weeks prior to repeated attempted nephroureteral stent placement. Electronically Signed   By: Sandi Mariscal M.D.   On: 10/07/2017 14:55        Scheduled Meds: . enoxaparin (LOVENOX) injection  40 mg Subcutaneous Q24H  . pantoprazole  40 mg Oral Daily  . polyethylene glycol  17 g Oral Daily  . protein supplement shake  11 oz Oral BID BM  . senna-docusate  2 tablet Oral BID  . sodium chloride flush  5 mL Intravenous Q8H   Continuous Infusions: . sodium chloride 100 mL/hr at 10/08/17 1022  . piperacillin-tazobactam (ZOSYN)  IV 3.375 g (10/08/17 1400)     LOS: 4 days    Time spent: 35 minutes    Hosie Poisson, MD [facility] Pager [phone]  If 7PM-7AM, please contact night-coverage [email or web address] Password [facility] 10/08/2017, 6:37 PM

## 2017-10-09 ENCOUNTER — Inpatient Hospital Stay (HOSPITAL_COMMUNITY): Payer: Medicare Other

## 2017-10-09 DIAGNOSIS — B3741 Candidal cystitis and urethritis: Secondary | ICD-10-CM

## 2017-10-09 DIAGNOSIS — A419 Sepsis, unspecified organism: Secondary | ICD-10-CM

## 2017-10-09 DIAGNOSIS — R7881 Bacteremia: Secondary | ICD-10-CM | POA: Diagnosis present

## 2017-10-09 DIAGNOSIS — Z936 Other artificial openings of urinary tract status: Secondary | ICD-10-CM

## 2017-10-09 DIAGNOSIS — R509 Fever, unspecified: Secondary | ICD-10-CM | POA: Diagnosis present

## 2017-10-09 DIAGNOSIS — Z933 Colostomy status: Secondary | ICD-10-CM

## 2017-10-09 DIAGNOSIS — K651 Peritoneal abscess: Secondary | ICD-10-CM

## 2017-10-09 DIAGNOSIS — C678 Malignant neoplasm of overlapping sites of bladder: Secondary | ICD-10-CM

## 2017-10-09 DIAGNOSIS — B961 Klebsiella pneumoniae [K. pneumoniae] as the cause of diseases classified elsewhere: Secondary | ICD-10-CM

## 2017-10-09 DIAGNOSIS — K219 Gastro-esophageal reflux disease without esophagitis: Secondary | ICD-10-CM

## 2017-10-09 DIAGNOSIS — N9989 Other postprocedural complications and disorders of genitourinary system: Secondary | ICD-10-CM

## 2017-10-09 HISTORY — DX: Bacteremia: B96.1

## 2017-10-09 HISTORY — DX: Bacteremia: R78.81

## 2017-10-09 LAB — CBC
HCT: 29.8 % — ABNORMAL LOW (ref 39.0–52.0)
Hemoglobin: 9.6 g/dL — ABNORMAL LOW (ref 13.0–17.0)
MCH: 24.9 pg — AB (ref 26.0–34.0)
MCHC: 32.2 g/dL (ref 30.0–36.0)
MCV: 77.4 fL — ABNORMAL LOW (ref 78.0–100.0)
PLATELETS: 229 10*3/uL (ref 150–400)
RBC: 3.85 MIL/uL — AB (ref 4.22–5.81)
RDW: 14.9 % (ref 11.5–15.5)
WBC: 9.1 10*3/uL (ref 4.0–10.5)

## 2017-10-09 LAB — BASIC METABOLIC PANEL
Anion gap: 5 (ref 5–15)
Anion gap: 5 (ref 5–15)
BUN: 12 mg/dL (ref 6–20)
BUN: 13 mg/dL (ref 6–20)
CALCIUM: 7.2 mg/dL — AB (ref 8.9–10.3)
CALCIUM: 7.3 mg/dL — AB (ref 8.9–10.3)
CO2: 22 mmol/L (ref 22–32)
CO2: 23 mmol/L (ref 22–32)
CREATININE: 0.87 mg/dL (ref 0.61–1.24)
Chloride: 110 mmol/L (ref 101–111)
Chloride: 109 mmol/L (ref 101–111)
Creatinine, Ser: 0.81 mg/dL (ref 0.61–1.24)
GFR calc Af Amer: >60 mL/min (ref >60)
GFR calc Af Amer: >60 mL/min (ref >60)
GLUCOSE: 109 mg/dL — AB (ref 65–99)
Glucose, Bld: 93 mg/dL (ref 65–99)
Potassium: 2.8 mmol/L — ABNORMAL LOW (ref 3.5–5.1)
Potassium: 2.8 mmol/L — ABNORMAL LOW (ref 3.5–5.1)
SODIUM: 137 mmol/L (ref 135–145)
Sodium: 137 mmol/L (ref 135–145)

## 2017-10-09 LAB — AEROBIC/ANAEROBIC CULTURE W GRAM STAIN (SURGICAL/DEEP WOUND)

## 2017-10-09 LAB — AEROBIC/ANAEROBIC CULTURE (SURGICAL/DEEP WOUND): SPECIAL REQUESTS: NORMAL

## 2017-10-09 LAB — CULTURE, BLOOD (ROUTINE X 2)
Culture: NO GROWTH
Special Requests: ADEQUATE

## 2017-10-09 LAB — MAGNESIUM: Magnesium: 1.8 mg/dL (ref 1.7–2.4)

## 2017-10-09 MED ORDER — POTASSIUM CHLORIDE CRYS ER 20 MEQ PO TBCR
40.0000 meq | EXTENDED_RELEASE_TABLET | ORAL | Status: AC
  Administered 2017-10-09 (×2): 40 meq via ORAL
  Filled 2017-10-09 (×2): qty 2

## 2017-10-09 MED ORDER — POTASSIUM CHLORIDE CRYS ER 20 MEQ PO TBCR
40.0000 meq | EXTENDED_RELEASE_TABLET | ORAL | Status: AC
  Administered 2017-10-09 – 2017-10-10 (×2): 40 meq via ORAL
  Filled 2017-10-09 (×2): qty 2

## 2017-10-09 MED ORDER — SODIUM CHLORIDE 0.9 % IV SOLN
100.0000 mg | INTRAVENOUS | Status: DC
  Administered 2017-10-10: 100 mg via INTRAVENOUS
  Filled 2017-10-09: qty 100

## 2017-10-09 MED ORDER — ANIDULAFUNGIN 100 MG IV SOLR
200.0000 mg | Freq: Once | INTRAVENOUS | Status: AC
  Administered 2017-10-09: 200 mg via INTRAVENOUS
  Filled 2017-10-09: qty 200

## 2017-10-09 MED ORDER — MAGNESIUM SULFATE 4 GM/100ML IV SOLN
4.0000 g | Freq: Once | INTRAVENOUS | Status: AC
  Administered 2017-10-09: 4 g via INTRAVENOUS
  Filled 2017-10-09: qty 100

## 2017-10-09 MED ORDER — SODIUM CHLORIDE 0.9% FLUSH
5.0000 mL | Freq: Three times a day (TID) | INTRAVENOUS | Status: DC
  Administered 2017-10-09 – 2017-10-14 (×14): 5 mL

## 2017-10-09 NOTE — Care Management Note (Signed)
Case Management Note  Patient Details  Name: Kristopher Thompson, DDS MRN: [id number] Date of Birth: 06-Jul-1951  Subjective/Objective: PT-recc HH, rw. [facility] rep Malachy Mood already following for Active HHRN-drain management, HHPT added. [place] dme rep Santiago Glad aware of home rw ordered-will deliver to rm prior d/c.                   Action/Plan:d/c home w/HHC/dme.   Expected Discharge Date:                  Expected Discharge Plan:  [place]  In-House Referral:     Discharge planning Services  CM Consult  Post Acute Care Choice:  Home Health(Active w/[facility]-HHRN) Choice offered to:  Patient  DME Arranged:  [place] [facility] DME Agency:  [place] Arranged:  RN, PT [facility] Agency:  [place]  Status of Service:  In process, will continue to follow  If discussed at Long Length of Stay Meetings, dates discussed:    Additional Comments:  Dessa Phi, RN 10/09/2017, 3:44 PM

## 2017-10-09 NOTE — Progress Notes (Signed)
Referring Physician(s): Borden,L  Supervising Physician: Markus Daft  Patient Status:  [facility] - In-pt  Chief Complaint:  Post op urine leak  Subjective:  Pt without acute changes; still with some low grade temp elevations  Allergies: Demerol [meperidine]  Medications: Prior to Admission medications   Medication Sig Start Date End Date Taking? Authorizing Provider  acetaminophen (TYLENOL) 500 MG tablet Take 1,000 mg by mouth daily as needed for mild pain. For pain   Yes [provider]  docusate sodium (COLACE) 100 MG capsule Take 2 capsules (200 mg total) by mouth 2 (two) times daily. 08/14/17 08/14/18 Yes McCormickLenna Sciara, MD  esomeprazole (NEXIUM) 20 MG capsule Take 20 mg by mouth daily as needed (heartburn).   Yes [provider]  HYDROcodone-acetaminophen (NORCO/VICODIN) 5-325 MG tablet Take 1-2 tablets by mouth every 6 (six) hours as needed for moderate pain (pain). 08/14/17  Yes Stasia Cavalier, MD     Vital Signs: BP (!) 120/59 (BP Location: Left Arm)   Pulse 63   Temp (!) 101.5 F (38.6 C) (Oral)   Resp 20   Ht 6' (1.829 m)   Wt 252 lb 13.9 oz (114.7 kg)   SpO2 100%   BMI 34.29 kg/m   Physical Exam awake/alert; rt abd drain intact , site ok, mildly tender, output 145 cc sl hazy urine; left PCN intact, output 1 liter yellow urine  Imaging: Dg Chest 2 View  Result Date: 10/09/2017 CLINICAL DATA:  Fever, shortness of breath. EXAM: CHEST  2 VIEW COMPARISON:  Radiographs of September 26, 2012. FINDINGS: The heart size and mediastinal contours are within normal limits. Hypoinflation of the lungs is noted with mild bibasilar subsegmental atelectasis. Minimal bilateral pleural effusions are noted. The visualized skeletal structures are unremarkable. IMPRESSION: Hypoinflation of the lungs with mild bibasilar subsegmental atelectasis and minimal bilateral pleural effusions. Electronically Signed   By: Marijo Conception, M.D.   On: 10/09/2017 13:04     Ct Abdomen Pelvis W Contrast  Result Date: 10/06/2017 CLINICAL DATA:  History of prostate and bladder cancer, status post radical cystectomy and ileal conduit urinary diversion complicated by a rectal injury repaired with diverting colostomy. Abdominal distention. Recent percutaneous catheter drainage of urinoma EXAM: CT ABDOMEN AND PELVIS WITH CONTRAST TECHNIQUE: Multidetector CT imaging of the abdomen and pelvis was performed using the standard protocol following bolus administration of intravenous contrast. CONTRAST:  180mL ISOVUE-300 IOPAMIDOL (ISOVUE-300) INJECTION 61% COMPARISON:  09/03/2017 exams FINDINGS: Lower chest: Atelectasis with trace pleural effusions, stable in appearance. Heart size is top normal with coronary arteriosclerosis and aortic atherosclerosis. Hepatobiliary: Status post cholecystectomy. No biliary dilatation. Mild hepatic steatosis. Pancreas: Atrophic pancreas with scattered pancreatic calcifications that may reflect chronic pancreatitis. No acute inflammation, ductal dilatation or mass. Spleen: Normal size without focal abnormality. Adrenals/Urinary Tract: Normal bilateral adrenal glands. 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 No significant reaccumulation of fluid. The bladder has been resected. Stomach/Bowel: Left lower quadrant colostomy with scattered colonic diverticulosis along the distal descending and sigmoid colon. Moderate stool is noted within the colon leading up to the colostomy in the left hemipelvis. Stable subcutaneous soft tissue induration about the ileal conduit and left lower quadrant colostomy site. Vascular/Lymphatic: Aortic atherosclerosis. No enlarged abdominal or pelvic lymph nodes. Reproductive: Prostate is not visualized and appears to have been resected. Other: Small right-sided  inguinal hernia containing fat. No free air. Musculoskeletal: No acute or significant osseous findings. IMPRESSION: 1. Status post percutaneous drainage urinoma in the right lower quadrant without reaccumulation of fluid noted. 2. 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 No bowel obstruction. No acute inflammation. 3. Stable trace pleural effusions and atelectasis at each lung base. 4. Mild hepatic steatosis.  status post cholecystectomy. 5. 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 Electronically Signed   By: Ashley Royalty M.D.   On: 10/06/2017 16:59   Ir Nephrostomy Placement Left  Result Date: 10/07/2017 INDICATION: History of bladder cancer, post radical cystectomy with ileal conduit creation, now with urine leak presumably secondary to injury involving the distal aspect the left ureter. Patient underwent CT-guided drainage of urinoma on 10/06/2017 by Dr. Kathlene Cote and presents now for ultrasound and fluoroscopic guided left-sided nephrostomy catheter placement and potential antegrade nephrostogram for urinary diversion purposes. EXAM: 1. ULTRASOUND GUIDANCE FOR PUNCTURE OF THE LEFT RENAL COLLECTING SYSTEM 2. LEFT PERCUTANEOUS NEPHROSTOMY TUBE PLACEMENT. COMPARISON:  CT abdomen and pelvis - 10/04/2017 ; 10/06/2017; CT-guided percutaneous drainage catheter placement - 10/04/2017 MEDICATIONS: Patient is currently admitted to the hospital and receiving intravenous antibiotics; The antibiotic was administered in an appropriate time frame prior to skin puncture. ANESTHESIA/SEDATION: Moderate (conscious) sedation was employed during this procedure. A total of Versed 5.5 mg and Fentanyl 175 mcg was administered intravenously. Moderate Sedation Time: 30 minutes. The patient's level of consciousness and vital signs were monitored continuously by radiology nursing throughout the procedure under my direct supervision. CONTRAST:  25 mL Isovue 300 administered into the collecting  system FLUOROSCOPY TIME:  7 minutes (242 mGy) COMPLICATIONS: None immediate. PROCEDURE: The procedure, risks, benefits, and alternatives were explained to the patient. Questions regarding the procedure were encouraged and answered. The patient understands and consents to the procedure. A timeout was performed prior to the initiation of the procedure. The left flank region was prepped with Betadine in a sterile fashion, and a sterile drape was applied covering the operative field. A sterile gown and sterile gloves were used for the procedure. Local anesthesia was provided with 1% Lidocaine with epinephrine. Ultrasound was used to localize the left kidney. Under direct ultrasound guidance, a 21 gauge needle was advanced into the renal collecting system. An ultrasound image documentation was performed. Access within the collecting system was confirmed with the efflux of urine followed by contrast injection. With the left renal collecting system apically opacified, a posterior inferior calyx was targeted fluoroscopically with a 22 gauge Chiba needle after the overlying soft tissues were anesthetized with 1% lidocaine with epinephrine. Access to the collecting system was confirmed with advancement of a Nitrex tracks wire into the left renal collecting system to the superior aspect the left ureter. The track was dilated with an Accustick set. Over a short Amplatz wire, a 8 French radiopaque tip vascular sheath was advanced through the collecting systems the superior aspect the left ureter. Next, a Kumpe catheter was utilized to perform a distal left nephrostogram. Attempts were made to traverse the area of extravasation/injury involving the distal aspect the left ureter, however this ultimately proved unsuccessful. Next, under intermittent fluoroscopic guidance, the vascular sheath was exchanged for a 10 French percutaneous nephrostomy catheter with end ultimately coiled and locked within the left renal pelvis. Contrast  was injected and several sport radiographs were obtained in various obliquities confirming access. The catheter was secured at the skin with a Prolene retention suture and a gravity bag was placed. A dressing was  placed. The patient tolerated procedure well without immediate postprocedural complication. FINDINGS: Ultrasound scanning demonstrates a mildly dilated left-sided renal collecting system, similar preceding abdominal CT. Under direct ultrasound guidance, the left renal collecting system was cannulated however initial access demonstrated a suspected puncture at the level of the infundibulum. As such, a opacified posterior inferior calyx was targeted fluoroscopically allowing placement of a vascular sheath to the level of the superior aspect of the left ureter. Distal nephrostogram demonstrates extravasation of contrast at the distal aspect of the left ureter regional to the anastomosis. There is extravasation of contrast into the adjacent peritoneum communicating with the end of the percutaneous drainage catheter. Attempts were made to traverse the distal left ureteral anastomotic injury however this ultimately proved unsuccessful. Successful placement of a 10 French percutaneous nephrostomy catheter with end coiled and locked within left renal pelvis. Contrast injection confirmed appropriate positioning. IMPRESSION: 1. Successful ultrasound and fluoroscopic guided placement of a left sided 10 French PCN. 2. Antegrade nephrostogram demonstrates a distal left ureteral injury with extravasation of contrast communicating with the percutaneous drainage catheter. PLAN: Would recommend urinary diversion via external drainage for a minimal of 2 weeks prior to repeated attempted nephroureteral stent placement. Electronically Signed   By: Sandi Mariscal M.D.   On: 10/07/2017 14:55    Labs:  CBC: Recent Labs    10/06/17 0431 10/07/17 0456 10/08/17 0510 10/09/17 0506  WBC 14.9* 14.9* 11.8* 9.1  HGB 9.6* 10.3*  9.9* 9.6*  HCT 31.0* 32.2* 30.3* 29.8*  PLT 252 312 273 229    COAGS: Recent Labs    10/07/17 0947  INR 1.18    BMP: Recent Labs    10/06/17 1333 10/07/17 0456 10/08/17 1943 10/09/17 0506  NA 137 141 140 137  K 3.1* 3.2* 2.8* 2.8*  CL 107 112* 109 110  CO2 23 20* 24 22  GLUCOSE 99 107* 101* 93  BUN 17 19 13 12   CALCIUM 7.9* 8.1* 7.4* 7.2*  CREATININE 0.90 0.85 0.90 0.87  GFRNONAA >60 >60 >60 >60  GFRAA >60 >60 >60 >60    LIVER FUNCTION TESTS: Recent Labs    10/04/17 0522 10/05/17 0602 10/07/17 0456  BILITOT 0.7 0.8 0.6  AST 28 16 14*  ALT 31 21 15*  ALKPHOS 67 61 64  PROT 6.5 5.6* 5.9*  ALBUMIN 2.7* 2.1* 2.3*    Assessment and Plan: Bladder/prostate cancer, s/p cystoprostatectomy/ileal conduit, colostomy after rectal injury 08/08/17; now with delayed urine leak, s/p urinoma drain placement on 10/04/17; s/p left PCN 10/07/17; temp 101.5, WBC nl; hgb 9.6, K 2.8- replace; creat nl; CXR with minimal bilat eff/atx; repeat fluid cx pend, prelim few yeast- on antifungal now; cont to monitor drain outputs /labs; IS/OOB/PT; additional plans as per urology/ID/IM    Electronically Signed: D. Rowe Robert, PA-C 10/09/2017, 3:31 PM   I spent a total of 15 minutes at the the patient's bedside AND on the patient's hospital floor or unit, greater than 50% of which was counseling/coordinating care for right abdominal/ left nephrostomy drains    Patient ID: Kristopher Thompson, DDS, male   DOB: 1951-03-19, 66 y.o.   MRN: [id number]

## 2017-10-09 NOTE — Progress Notes (Signed)
Patient ID: Kristopher Thompson, DDS, male   DOB: 1951/06/09, 66 y.o.   MRN: [id number]    Subjective: Pt with recurrent fever over last 24 hrs. Feels weak and with poor appetite.  No pain symptoms.  Objective: Vital signs in last 24 hours: Temp:  [100 F (37.8 C)-102.9 F (39.4 C)] 102.2 F (39 C) (11/28 0649) Pulse Rate:  [70-86] 70 (11/28 0649) Resp:  [18-20] 20 (11/28 0649) BP: (125-158)/(60-67) 125/62 (11/28 0649) SpO2:  [96 %-99 %] 97 % (11/28 0649)  Intake/Output from previous day: 11/27 0701 - 11/28 0700 In: 1765 [P.O.:480; I.V.:1185; IV Piggyback:100] Out: 1905 [Urine:1700; Drains:145; Stool:60]  - Nephrostomy and Urostomy both draining well. Intake/Output this shift: No intake/output data recorded.  Physical Exam:  General: Alert and oriented CV: RRR Lungs: Clear Abdomen: Soft, ND, positive BS, urostomy pink and patent, colostomy with stool in bag, NT Wound: Clean and no longer requiring packing. Ext: NT, No erythema  Lab Results: Recent Labs    10/07/17 0456 10/08/17 0510 10/09/17 0506  HGB 10.3* 9.9* 9.6*  HCT 32.2* 30.3* 29.8*   CBC Latest Ref Rng & Units 10/09/2017 10/08/2017 10/07/2017  WBC 4.0 - 10.5 K/uL 9.1 11.8(H) 14.9(H)  Hemoglobin 13.0 - 17.0 g/dL 9.6(L) 9.9(L) 10.3(L)  Hematocrit 39.0 - 52.0 % 29.8(L) 30.3(L) 32.2(L)  Platelets 150 - 400 K/uL 229 273 312     BMET Recent Labs    10/08/17 1943 10/09/17 0506  NA 140 137  K 2.8* 2.8*  CL 109 110  CO2 24 22  GLUCOSE 101* 93  BUN 13 12  CREATININE 0.90 0.87  CALCIUM 7.4* 7.2*     Studies/Results: Ir Nephrostomy Placement Left  Result Date: 10/07/2017 INDICATION: History of bladder cancer, post radical cystectomy with ileal conduit creation, now with urine leak presumably secondary to injury involving the distal aspect the left ureter. Patient underwent CT-guided drainage of urinoma on 10/06/2017 by Dr. Kathlene Cote and presents now for ultrasound and fluoroscopic guided left-sided  nephrostomy catheter placement and potential antegrade nephrostogram for urinary diversion purposes. EXAM: 1. ULTRASOUND GUIDANCE FOR PUNCTURE OF THE LEFT RENAL COLLECTING SYSTEM 2. LEFT PERCUTANEOUS NEPHROSTOMY TUBE PLACEMENT. COMPARISON:  CT abdomen and pelvis - 10/04/2017 ; 10/06/2017; CT-guided percutaneous drainage catheter placement - 10/04/2017 MEDICATIONS: Patient is currently admitted to the hospital and receiving intravenous antibiotics; The antibiotic was administered in an appropriate time frame prior to skin puncture. ANESTHESIA/SEDATION: Moderate (conscious) sedation was employed during this procedure. A total of Versed 5.5 mg and Fentanyl 175 mcg was administered intravenously. Moderate Sedation Time: 30 minutes. The patient's level of consciousness and vital signs were monitored continuously by radiology nursing throughout the procedure under my direct supervision. CONTRAST:  25 mL Isovue 300 administered into the collecting system FLUOROSCOPY TIME:  7 minutes (267 mGy) COMPLICATIONS: None immediate. PROCEDURE: The procedure, risks, benefits, and alternatives were explained to the patient. Questions regarding the procedure were encouraged and answered. The patient understands and consents to the procedure. A timeout was performed prior to the initiation of the procedure. The left flank region was prepped with Betadine in a sterile fashion, and a sterile drape was applied covering the operative field. A sterile gown and sterile gloves were used for the procedure. Local anesthesia was provided with 1% Lidocaine with epinephrine. Ultrasound was used to localize the left kidney. Under direct ultrasound guidance, a 21 gauge needle was advanced into the renal collecting system. An ultrasound image documentation was performed. Access within the collecting system was confirmed with the  efflux of urine followed by contrast injection. With the left renal collecting system apically opacified, a posterior  inferior calyx was targeted fluoroscopically with a 22 gauge Chiba needle after the overlying soft tissues were anesthetized with 1% lidocaine with epinephrine. Access to the collecting system was confirmed with advancement of a Nitrex tracks wire into the left renal collecting system to the superior aspect the left ureter. The track was dilated with an Accustick set. Over a short Amplatz wire, a 8 French radiopaque tip vascular sheath was advanced through the collecting systems the superior aspect the left ureter. Next, a Kumpe catheter was utilized to perform a distal left nephrostogram. Attempts were made to traverse the area of extravasation/injury involving the distal aspect the left ureter, however this ultimately proved unsuccessful. Next, under intermittent fluoroscopic guidance, the vascular sheath was exchanged for a 10 French percutaneous nephrostomy catheter with end ultimately coiled and locked within the left renal pelvis. Contrast was injected and several sport radiographs were obtained in various obliquities confirming access. The catheter was secured at the skin with a Prolene retention suture and a gravity bag was placed. A dressing was placed. The patient tolerated procedure well without immediate postprocedural complication. FINDINGS: Ultrasound scanning demonstrates a mildly dilated left-sided renal collecting system, similar preceding abdominal CT. Under direct ultrasound guidance, the left renal collecting system was cannulated however initial access demonstrated a suspected puncture at the level of the infundibulum. As such, a opacified posterior inferior calyx was targeted fluoroscopically allowing placement of a vascular sheath to the level of the superior aspect of the left ureter. Distal nephrostogram demonstrates extravasation of contrast at the distal aspect of the left ureter regional to the anastomosis. There is extravasation of contrast into the adjacent peritoneum communicating with  the end of the percutaneous drainage catheter. Attempts were made to traverse the distal left ureteral anastomotic injury however this ultimately proved unsuccessful. Successful placement of a 10 French percutaneous nephrostomy catheter with end coiled and locked within left renal pelvis. Contrast injection confirmed appropriate positioning. IMPRESSION: 1. Successful ultrasound and fluoroscopic guided placement of a left sided 10 French PCN. 2. Antegrade nephrostogram demonstrates a distal left ureteral injury with extravasation of contrast communicating with the percutaneous drainage catheter. PLAN: Would recommend urinary diversion via external drainage for a minimal of 2 weeks prior to repeated attempted nephroureteral stent placement. Electronically Signed   By: Sandi Mariscal M.D.   On: 10/07/2017 14:55    Assessment/Plan: Delayed urine leak s/p radical cystectomy and ileal conduit 08/08/17 - Fever with deceasing WBC and on appropriate antibiotics for Klebsiella blood culture.  Check CXR and repeat cultures from drain and nephrostomy.  ? Repeat blood cultures.  IM to consider ID consult if persists.  I have ordered CXR this morning. - Continue to advance diet as tolerated - Continue abdominal drain and nephrostomy drainage and monitor outputs.  Pt appears to be draining well and do not think repeat abdominal imaging is needed.   LOS: 5 days   Dajahnae Vondra,LES 10/09/2017, 7:36 AM

## 2017-10-09 NOTE — Evaluation (Signed)
Physical Therapy Evaluation Patient Details Name: Kristopher Thompson, DDS MRN: [id number] DOB: 07-14-1951 Today's Date: 10/09/2017   History of Present Illness  66 y.o. male,.. With history of prostate cancer, bladder cancer status post radical cystectomy and ileal conduit urinary diversion on 5/78/4696, complicated by rectal injury which was repaired by general surgery status post diverting colostomy came to hospital with complaints of abdominal pain for past 2 days. 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 Urology and surgery consulted and on board. He also underwent PCN on the left by IR on 11/26. Dx of sepsis.  Clinical Impression  Pt admitted with above diagnosis. Pt currently with functional limitations due to the deficits listed below (see PT Problem List). Mod assist for bed mobility, min A transfers, pt ambulated 20' with RW, distance limited by dizziness/fatigue. ST-SNF recommended, pt declined.  Pt will benefit from skilled PT to increase their independence and safety with mobility to allow discharge to the venue listed below.       Follow Up Recommendations Home health PT(SNF recommended but pt refused)    Equipment Recommendations  Rolling walker with 5" wheels    Recommendations for Other Services       Precautions / Restrictions Precautions Precautions: Fall Precaution Comments: pt denies h/o falls Restrictions Weight Bearing Restrictions: No      Mobility  Bed Mobility Overal bed mobility: Needs Assistance Bed Mobility: Rolling;Sidelying to Sit Rolling: Min assist Sidelying to sit: Mod assist       General bed mobility comments: VCs for log roll technique, assist to initiate roll and mod A to raise trunk, pt reported abdominal pain with bed mobility  Transfers Overall transfer level: Needs assistance Equipment used: Rolling walker (2 wheeled) Transfers: Sit to/from Stand Sit to Stand: Min assist;From elevated surface          General transfer comment: VCs hand placement  Ambulation/Gait Ambulation/Gait assistance: Min guard Ambulation Distance (Feet): 20 Feet Assistive device: Rolling walker (2 wheeled) Gait Pattern/deviations: Step-through pattern;Decreased stride length   Gait velocity interpretation: Below normal speed for age/gender General Gait Details: distance limited by dizziness/fatigue, no LOB, HR 70, SaO2 91% on RA, 2/4 dyspnea  Stairs            Wheelchair Mobility    Modified Rankin (Stroke Patients Only)       Balance Overall balance assessment: Needs assistance   Sitting balance-Leahy Scale: Good       Standing balance-Leahy Scale: Fair                               Pertinent Vitals/Pain Pain Assessment: No/denies pain    Home Living Family/patient expects to be discharged to:: Private residence Living Arrangements: Spouse/significant other;Parent Available Help at Discharge: Family;Available 24 hours/day Type of Home: House Home Access: Ramped entrance     Home Layout: Two level;Able to live on main level with bedroom/bathroom Home Equipment: Kasandra Knudsen - single point;Shower seat      Prior Function Level of Independence: Independent               Hand Dominance   Dominant Hand: Right    Extremity/Trunk Assessment   Upper Extremity Assessment Upper Extremity Assessment: Overall WFL for tasks assessed    Lower Extremity Assessment Lower Extremity Assessment: Generalized weakness(B knee ext -4/5)    Cervical / Trunk Assessment Cervical / Trunk Assessment: Normal  Communication   Communication: No difficulties  Cognition  Arousal/Alertness: Awake/alert Behavior During Therapy: WFL for tasks assessed/performed Overall Cognitive Status: Within Functional Limits for tasks assessed                                        General Comments      Exercises     Assessment/Plan    PT Assessment Patient needs continued PT  services  PT Problem List Decreased activity tolerance;Decreased strength;Decreased mobility       PT Treatment Interventions Gait training;DME instruction;Therapeutic activities;Functional mobility training;Therapeutic exercise    PT Goals (Current goals can be found in the Care Plan section)  Acute Rehab PT Goals Patient Stated Goal: be able to walk more PT Goal Formulation: With patient/family Time For Goal Achievement: 10/23/17 Potential to Achieve Goals: Good    Frequency Min 3X/week   Barriers to discharge        Co-evaluation               AM-PAC PT "6 Clicks" Daily Activity  Outcome Measure Difficulty turning over in bed (including adjusting bedclothes, sheets and blankets)?: Unable Difficulty moving from lying on back to sitting on the side of the bed? : Unable Difficulty sitting down on and standing up from a chair with arms (e.g., wheelchair, bedside commode, etc,.)?: Unable Help needed moving to and from a bed to chair (including a wheelchair)?: A Little Help needed walking in hospital room?: A Little Help needed climbing 3-5 steps with a railing? : A Lot 6 Click Score: 11    End of Session   Activity Tolerance: Patient limited by fatigue Patient left: in chair;with call bell/phone within reach;with family/visitor present;with nursing/sitter in room Nurse Communication: Mobility status PT Visit Diagnosis: Difficulty in walking, not elsewhere classified (R26.2)    Time: 7322-0254 PT Time Calculation (min) (ACUTE ONLY): 24 min   Charges:   PT Evaluation $PT Eval Moderate Complexity: 1 Mod PT Treatments $Gait Training: 8-22 mins   PT G Codes:          Philomena Doheny 10/09/2017, 10:08 AM [phone]

## 2017-10-09 NOTE — Consult Note (Signed)
Date of Admission:  10/04/2017          Reason for Consult: Persistent fevers    Referring Provider: Dr. Grandville Silos   Assessment: 1.  Percutaneous Left sided nephrostomy on 10/08/17 with culture too young to read 2. 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  Klebsiella Pneumonia bacteremia 3.  Bladder, prostate cancer sp cystoprastatectomy, ileal conduit 4. Rectal injury requiring colostomy 5. Persistent fevers  Plan: 1. Start Eraxis to cover yeast from culture right kidney  2. continue zosyn to cover organisms from urinoma/ abscess 3. Zosyn will also cover Klebsiella PNA in blood 4. 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 enoxaparin (LOVENOX) injection  40 mg Subcutaneous Q24H  . pantoprazole  40 mg Oral Daily  . polyethylene glycol  17 g Oral Daily  . potassium chloride  40 mEq Oral Q4H  . protein supplement shake  11 oz Oral BID BM  . senna-docusate  2 tablet Oral BID  . sodium chloride flush  5 mL Intravenous Q8H   Continuous Infusions: . sodium chloride 100 mL/hr at 10/09/17 0624  . [START ON 10/10/2017] anidulafungin    . magnesium sulfate 1 - 4 g bolus IVPB    . 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 He had blood cultures drawn. He was found to have urine leak at uretero-enteric anastaomis. He was placed on broad spectrum antibiotics with vancomycin and zosyn. Ir placed percutaneous drainage of right sided urinoma/abscess--> cx mx organisms initially on aspirate from the 23rd. He has a new culture from this site showing yeast but "WAS THIS [place] or via a freshly placed line?" I believe it was the former.  He had a left sided   nephrostomy tube placed on 10/07/17 with cultures too young to read.   His admission blood cultures have grown Klebsiella PNA which was R onl to AMP and AMP/SUL.   Marland Kitchen Patient's sepsis responded to IVF, antibiotics, drainage and his fevers defervesced. His abdominal pain improved dramatically. He has had repeat CT scan that shows no re-accumulation of fluid in urinoma, abscess site. He has had however recurrence of fevers in last 48 hours.   I have added antifungal coverage with Eraxis though we may very well only be treating a colonizer of the tubing rather than a pathogen.   I will keep zosyn on board in case we had an enterococcus in the abscess/urinoma that needs to be covered (the klebsiella is not  AMP/SUL S) and this will also give [place] anaerobic coverage.   If he continues to fever would get CT chest and repeat CT abdomen and pelvis with IV and oral contrast.      Review of Systems: Review of Systems  Constitutional: Positive for chills, diaphoresis, fever and malaise/fatigue. Negative for weight loss.  HENT: Negative for congestion and sore throat.   Eyes: Negative for blurred vision and  photophobia.  Respiratory: Negative for cough, shortness of breath and wheezing.   Cardiovascular: Negative for chest pain, palpitations and leg swelling.  Gastrointestinal: Positive for abdominal pain and nausea. Negative for blood in stool, constipation, diarrhea, heartburn, melena and vomiting.  Genitourinary: Negative for dysuria, flank pain and hematuria.  Musculoskeletal: Negative for back pain, falls, joint pain and myalgias.  Skin: Negative for itching and rash.  Neurological: Positive for weakness. Negative for dizziness, focal weakness, loss of consciousness and headaches.  Endo/Heme/Allergies: Does not bruise/bleed easily.  Psychiatric/Behavioral: Positive for depression. Negative for suicidal ideas. The patient does not have insomnia.     Past Medical History:  Diagnosis Date  . Bladder cancer ([place]) dx'd 07/2017  . Cancer [facility])    PROSTATE CANCER  . Complication of anesthesia    AFTER GB SURGERY PT FELT LIKE FLOOR AND BED VIBRATING--WOKE UP TO FIND HIMSELF STANDING UP AT BEDSIDE.  Marland Kitchen GERD (gastroesophageal reflux disease)   . H/O hiatal hernia   . RBBB    NO PROBLEMS    Social History   Tobacco Use  . Smoking status: Former Research scientist (life sciences)  . Smokeless tobacco: Never Used  . Tobacco comment: 66 years old to 66 years old  Substance Use Topics  . Alcohol use: Yes    Comment: occasional  . Drug use: No    History reviewed. No pertinent family history. Allergies  Allergen Reactions  . Demerol [Meperidine] Other (See Comments)    SEVERE NAUSEA    OBJECTIVE: Blood pressure (!) 120/59, pulse 63, temperature (!) 101.5 F (38.6 C), temperature source Oral, resp. rate 20, height 6' (1.829 m), weight 252 lb 13.9 oz (114.7 kg), SpO2 100 %.  Physical Exam  Constitutional: He is oriented to person, place, and time. No distress.  HENT:  Head: Normocephalic and atraumatic.  Right Ear: External ear normal.  Left Ear: External ear normal.  Nose: Nose normal.  Eyes: Conjunctivae  and EOM are normal. No scleral icterus.  Neck: Normal range of motion. Neck supple.  Cardiovascular: Normal rate, regular rhythm and normal heart sounds. Exam reveals no gallop and no friction rub.  No murmur heard. Pulmonary/Chest: Effort normal and breath sounds normal. No respiratory distress. He has no wheezes. He has no rales.  Abdominal: Soft. Bowel sounds are normal. He exhibits no distension. There is tenderness. There is no rebound and no guarding.  Musculoskeletal: Normal range of motion. He exhibits edema. He exhibits no tenderness.  Neurological: He is alert and oriented to person, place, and time. Coordination normal.  Skin: Skin is warm and dry. No rash noted. He is not diaphoretic. No erythema. No pallor.  Psychiatric: Memory, affect and judgment normal. He exhibits a depressed mood.  Nursing note and vitals reviewed.  GI : colostomy clean, ileal conduit intact, left sided PC nephrotomy with blood tinged urine in place, right sided drain with urine  Lab Results Lab Results  Component Value Date   WBC 9.1 10/09/2017   HGB 9.6 (L) 10/09/2017   HCT 29.8 (L) 10/09/2017   MCV 77.4 (L) 10/09/2017  PLT 229 10/09/2017    Lab Results  Component Value Date   CREATININE 0.81 10/09/2017   BUN 13 10/09/2017   NA 137 10/09/2017   K 2.8 (L) 10/09/2017   CL 109 10/09/2017   CO2 23 10/09/2017    Lab Results  Component Value Date   ALT 15 (L) 10/07/2017   AST 14 (L) 10/07/2017   ALKPHOS 64 10/07/2017   BILITOT 0.6 10/07/2017     Microbiology: Recent Results (from the past 240 hour(s))  Culture, blood (Routine X 2) w Reflex to ID Panel     Status: Abnormal   Collection Time: 10/04/17  5:33 AM  Result Value Ref Range Status   Specimen Description BLOOD LEFT ANTECUBITAL  Final   Special Requests   Final    BOTTLES DRAWN AEROBIC AND ANAEROBIC Blood Culture adequate volume   Culture  Setup Time   Final    GRAM NEGATIVE RODS AEROBIC BOTTLE ONLY CRITICAL RESULT CALLED TO,  READ BACK BY AND VERIFIED WITH: J. GRIMSLEY, RPHARMD ([place]) AT 0308 ON 10/05/17 BY C. JESSUP, MLT. Performed at [facility], [place] [address], [place], [place] [address]    Culture KLEBSIELLA PNEUMONIAE (A)  Final   Report Status 10/07/2017 FINAL  Final   Organism ID, Bacteria KLEBSIELLA PNEUMONIAE  Final      Susceptibility   Klebsiella pneumoniae - MIC*    AMPICILLIN >=32 RESISTANT Resistant     CEFAZOLIN <=4 SENSITIVE Sensitive     CEFEPIME <=1 SENSITIVE Sensitive     CEFTAZIDIME <=1 SENSITIVE Sensitive     CEFTRIAXONE <=1 SENSITIVE Sensitive     CIPROFLOXACIN <=0.25 SENSITIVE Sensitive     GENTAMICIN <=1 SENSITIVE Sensitive     IMIPENEM <=0.25 SENSITIVE Sensitive     TRIMETH/SULFA <=20 SENSITIVE Sensitive     AMPICILLIN/SULBACTAM >=32 RESISTANT Resistant     PIP/TAZO 16 SENSITIVE Sensitive     Extended ESBL NEGATIVE Sensitive     * KLEBSIELLA PNEUMONIAE  Blood Culture ID Panel (Reflexed)     Status: Abnormal   Collection Time: 10/04/17  5:33 AM  Result Value Ref Range Status   Enterococcus species NOT DETECTED NOT DETECTED Final   Listeria monocytogenes NOT DETECTED NOT DETECTED Final   Staphylococcus species NOT DETECTED NOT DETECTED Final   Staphylococcus aureus NOT DETECTED NOT DETECTED Final   Streptococcus species NOT DETECTED NOT DETECTED Final   Streptococcus agalactiae NOT DETECTED NOT DETECTED Final   Streptococcus pneumoniae NOT DETECTED NOT DETECTED Final   Streptococcus pyogenes NOT DETECTED NOT DETECTED Final   Acinetobacter baumannii NOT DETECTED NOT DETECTED Final   Enterobacteriaceae species DETECTED (A) NOT DETECTED Final    Comment: Enterobacteriaceae represent a large family of gram-negative bacteria, not a single organism. CRITICAL RESULT CALLED TO, READ BACK BY AND VERIFIED WITH: J. GRIMSLEY, RPHARMD ([place]) AT 0308 ON 10/05/17 BY C. JESSUP, MLT.    Enterobacter cloacae complex NOT DETECTED NOT DETECTED Final   Escherichia coli NOT DETECTED NOT DETECTED  Final   Klebsiella oxytoca NOT DETECTED NOT DETECTED Final   Klebsiella pneumoniae DETECTED (A) NOT DETECTED Final    Comment: CRITICAL RESULT CALLED TO, READ BACK BY AND VERIFIED WITH: J. GRIMSLEY, RPHARMD ([place]) AT 0308 ON 10/05/17 BY C. JESSUP, MLT.    Proteus species NOT DETECTED NOT DETECTED Final   Serratia marcescens NOT DETECTED NOT DETECTED Final   Carbapenem resistance NOT DETECTED NOT DETECTED Final   Haemophilus influenzae NOT DETECTED NOT DETECTED Final   Neisseria meningitidis NOT  DETECTED NOT DETECTED Final   Pseudomonas aeruginosa NOT DETECTED NOT DETECTED Final   Candida albicans NOT DETECTED NOT DETECTED Final   Candida glabrata NOT DETECTED NOT DETECTED Final   Candida krusei NOT DETECTED NOT DETECTED Final   Candida parapsilosis NOT DETECTED NOT DETECTED Final   Candida tropicalis NOT DETECTED NOT DETECTED Final    Comment: Performed at [facility], [place] [address], [place], [place] [address]  Culture, blood (Routine X 2) w Reflex to ID Panel     Status: None   Collection Time: 10/04/17  5:35 AM  Result Value Ref Range Status   Specimen Description BLOOD RIGHT ANTECUBITAL  Final   Special Requests   Final    BOTTLES DRAWN AEROBIC AND ANAEROBIC Blood Culture adequate volume   Culture   Final    NO GROWTH 5 DAYS Performed at [facility], [place] [address], [place], [place] [address]    Report Status 10/09/2017 FINAL  Final  Culture, Urine     Status: Abnormal   Collection Time: 10/04/17  5:47 AM  Result Value Ref Range Status   Specimen Description URINE, RANDOM  Final   Special Requests NONE  Final   Culture MULTIPLE SPECIES PRESENT, SUGGEST RECOLLECTION (A)  Final   Report Status 10/05/2017 FINAL  Final  Aerobic/Anaerobic Culture (surgical/deep wound)     Status: Abnormal   Collection Time: 10/04/17 12:19 PM  Result Value Ref Range Status   Specimen Description ABSCESS  Final   Special Requests Normal  Final   Gram Stain   Final    ABUNDANT WBC  PRESENT,BOTH PMN AND MONONUCLEAR ABUNDANT GRAM VARIABLE ROD FEW GRAM POSITIVE COCCI IN PAIRS IN CHAINS    Culture (A)  Final    MULTIPLE ORGANISMS PRESENT, NONE PREDOMINANT NO ANAEROBES ISOLATED Performed at [facility], [place] [address] [place], [place] [address]    Report Status 10/09/2017 FINAL  Final  Culture, blood (Routine X 2) w Reflex to ID Panel     Status: None (Preliminary result)   Collection Time: 10/06/17  9:42 AM  Result Value Ref Range Status   Specimen Description BLOOD RIGHT ANTECUBITAL  Final   Special Requests   Final    BOTTLES DRAWN AEROBIC AND ANAEROBIC Blood Culture results may not be optimal due to an excessive volume of blood received in culture bottles   Culture   Final    NO GROWTH 3 DAYS Performed at [facility], [place] [address], [place], [place] [address]    Report Status PENDING  Incomplete  Culture, blood (Routine X 2) w Reflex to ID Panel     Status: None (Preliminary result)   Collection Time: 10/06/17  9:50 AM  Result Value Ref Range Status   Specimen Description BLOOD LEFT ANTECUBITAL  Final   Special Requests IN PEDIATRIC BOTTLE Blood Culture adequate volume  Final   Culture   Final    NO GROWTH 3 DAYS Performed at [facility], [place] [address], [place], [place] [address]    Report Status PENDING  Incomplete  Urine Culture     Status: None (Preliminary result)   Collection Time: 10/08/17  4:32 PM  Result Value Ref Range Status   Specimen Description KIDNEY LEFT  Final   Special Requests Normal  Final   Culture   Final    TOO YOUNG TO READ Performed at [facility], [place] [address] [place], [place] [address]  Report Status PENDING  Incomplete  Body fluid culture     Status: None (Preliminary result)   Collection Time: 10/08/17  6:16 PM  Result Value Ref Range Status   Specimen Description ABDOMEN  Final   Special Requests Normal  Final   Gram Stain   Final    MODERATE WBC PRESENT, PREDOMINANTLY PMN FEW YEAST  WITH PSEUDOHYPHAE    Culture   Final    TOO YOUNG TO READ Performed at [facility], [place] [address] [place], [place] [address]    Report Status PENDING  Incomplete    Alcide Evener, [place] for [place] [phone] pager   [phone] cell 10/09/2017, 6:49 PM

## 2017-10-09 NOTE — Progress Notes (Signed)
Patient encourage to ambulate multiple times this shift but refuses. Michela Pitcher he will ambulate later today. Incentive spirometry encourage.

## 2017-10-09 NOTE — Progress Notes (Signed)
PROGRESS NOTE    Kristopher Thompson, DDS  QMV:[id number] DOB: [birth year] DOA: 10/04/2017 PCP: Lorene Dy, MD    Brief Narrative:  [name] a66 y.o.male,..With history of prostate cancer, bladder cancer status post radical cystectomy and ileal conduit urinary diversion on 2/84/1324, complicated by rectal injury which was repaired by general surgery status post diverting colostomy came to hospital with complaints of abdominal pain for past 2 days.Pt underwentCT guided percutaneous catheter drainage of urinoma. Urology and surgery consulted and on board. He also underwent PCN on the left by IR on 11/26. Pt continues to have fevers, on broad spectrum antibiotics. 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 Status post ureteral stent removal 1120, status post percutaneous catheter drainage of urinoma 1123 with continued clear urine. Patient was pancultured blood cultures from 10/04/2017 positive for Klebsiella pneumonia. Repeat blood cultures from 10/06/2017 with no growth to date. Anaerobic and aerobic cultures with abundant WBCs, multiple organisms present none predominant no anaerobes isolated. Body fluid culture from 10/08/2017 with moderate WBC, few yeast with pseudohyphae. Patient still spiking fevers despite broad coverage with IV vancomycin and IV Zosyn. Case discussed with ID and ID was seen in formal consultation however ID  recommended addition of IV Eraxis while awaiting final culture results and sensitivities. Supportive care.  #2. Klebsiella bacteremia Continue Vancomycin and IV Zosyn. Patient still spiking fevers. ID consultation pending. Follow.  #3 hypokalemia/hypophosphatemia Replete.  #4 anemia of chronic disease H&H stable.  #5 delayed urine leak status post radical cystectomy and ileal conduit 08/08/2017 Patient being followed by urology who recommended continuing abdominal drain and nephrostomy drainage with monitoring of outputs. Per urology.   DVT prophylaxis: SCDs Code Status: Full Family Communication: Updated patient and wife at bedside. Disposition Plan: Likely home with home health once patient is afebrile for 48 hours with clinical improvement.   Consultants:   Urology: Dr. Junious Silk 10/04/2017  Interventional radiologist: Dr. Kathlene Cote 10/04/2017  ID pending  Procedures:   CT abdomen and pelvis 10/06/2017, 10/04/2017  Chest x-ray 10/09/2017  CT-guided percutaneous catheter drainage of urinoma anterior to ileal conduit per Dr. Kathlene Cote 10/04/2017  Ultrasound and fluoroscopy scopic guided placement of left sided 24 French or cutaneous nephrostomy, antegrade nephrostogram demonstrates a distal left ureteral injury with extravasation of contrast communicating with percutaneous drainage catheter per Dr. Pascal Lux 10/07/2017   Antimicrobials:   IV Eraxis 10/09/2017  IV vancomycin 10/04/2017  IV Zosyn 10/04/2017   Subjective: Patient noted to still be spiking fevers over this past 24 hours. Patient denies any chest pain. No shortness of breath. Patient with generalized weakness.  Objective: Vitals:   10/08/17 1808 10/08/17 2213 10/09/17 0649 10/09/17 1340  BP:  138/60 125/62 (!) 120/59  Pulse:  77 70 63  Resp:  20 20 20   Temp: 100 F (37.8 C) (!) 101.3 F (38.5 C) (!) 102.2 F (39 C) (!) 101.5 F (38.6 C)  TempSrc: Oral Oral Oral Oral  SpO2:  99% 97% 100%  Weight:       Height:        Intake/Output Summary (Last 24 hours) at 10/09/2017 1845 Last data filed at 10/09/2017 1630 Gross per 24 hour  Intake 1285 ml  Output 2445 ml  Net -1160 ml   Filed Weights   10/04/17 0111  Weight: 114.7 kg (252 lb 13.9 oz)    Examination:  General exam: Appears calm and comfortable  Respiratory system: Clear to auscultation bilaterally. No wheezes, no crackles, no rhonchi. Respiratory effort normal. Cardiovascular system: S1 & S2 heard, RRR. No JVD, murmurs, rubs, gallops or clicks. No pedal edema. Gastrointestinal system: Abdomen is nondistended, soft and nontender. No organomegaly or masses felt. Colostomy bag with stool noted. Nephrostomy bag draining. Urostomy bag draining. Normal bowel sounds heard. Central nervous system: Alert and oriented. No focal neurological deficits. Extremities: Symmetric 5 x 5 power. Skin: No rashes, lesions or ulcers Psychiatry: Judgement and insight appear normal. Mood & affect appropriate.     Data Reviewed: I have personally reviewed following labs and imaging studies  CBC: Recent Labs  Lab 10/05/17 0602 10/06/17 0431 10/07/17 0456 10/08/17 0510 10/09/17 0506  WBC 12.9* 14.9* 14.9* 11.8* 9.1  HGB 9.4* 9.6* 10.3* 9.9* 9.6*  HCT 30.2* 31.0* 32.2* 30.3* 29.8*  MCV 81.2 81.6 78.3 77.3* 77.4*  PLT 252 252 312 273 939   Basic Metabolic Panel: Recent Labs  Lab 10/06/17 1333 10/07/17 0456 10/08/17 1943 10/09/17 0506 10/09/17 1647  NA 137 141 140 137 137  K 3.1* 3.2* 2.8* 2.8* 2.8*  CL 107 112* 109 110 109  CO2 23 20* 24 22 23   GLUCOSE 99 107* 101* 93 109*  BUN 17 19 13 12 13   CREATININE 0.90 0.85 0.90 0.87 0.81  CALCIUM 7.9* 8.1* 7.4* 7.2* 7.3*  MG 2.0  --   --  1.8  --   PHOS 1.7*  --   --   --   --    GFR: Estimated Creatinine Clearance: 118.8 mL/min (by C-G formula based on SCr of 0.81 mg/dL). Liver Function Tests: Recent Labs  Lab 10/04/17 0522 10/05/17 0602 10/07/17 0456  AST 28 16 14*  ALT 31  21 15*  ALKPHOS 67 61 64  BILITOT 0.7 0.8 0.6  PROT 6.5 5.6* 5.9*  ALBUMIN 2.7* 2.1* 2.3*   No results for input(s): LIPASE, AMYLASE in the last 168 hours. No results for input(s): AMMONIA in the last 168 hours. Coagulation Profile: Recent Labs  Lab 10/07/17 0947  INR 1.18   Cardiac Enzymes: No results for input(s): CKTOTAL, CKMB, CKMBINDEX, TROPONINI in the last 168 hours. BNP (last 3 results) No results for input(s): PROBNP in the last 8760 hours. HbA1C: No results for input(s): HGBA1C in the last 72 hours. CBG: No results for input(s): GLUCAP in the last 168 hours. Lipid Profile: No results for input(s): CHOL, HDL, LDLCALC, TRIG, CHOLHDL, LDLDIRECT in the last 72 hours. Thyroid Function Tests: No results for input(s): TSH, T4TOTAL, FREET4, T3FREE, THYROIDAB in the last 72 hours. Anemia Panel: No results for input(s): VITAMINB12, FOLATE, FERRITIN, TIBC, IRON, RETICCTPCT in the last 72 hours. Sepsis Labs: Recent Labs  Lab 10/04/17 0522 10/04/17 0813 10/05/17 0602 10/06/17 0431  PROCALCITON 4.58  --  5.36 3.76  LATICACIDVEN 1.7 2.0* 1.3  --     Recent Results (from the past 240 hour(s))  Culture, blood (Routine X 2) w Reflex to ID Panel     Status: Abnormal   Collection Time: 10/04/17  5:33 AM  Result  Value Ref Range Status   Specimen Description BLOOD LEFT ANTECUBITAL  Final   Special Requests   Final    BOTTLES DRAWN AEROBIC AND ANAEROBIC Blood Culture adequate volume   Culture  Setup Time   Final    GRAM NEGATIVE RODS AEROBIC BOTTLE ONLY CRITICAL RESULT CALLED TO, READ BACK BY AND VERIFIED WITH: J. GRIMSLEY, RPHARMD ([place]) AT 0308 ON 10/05/17 BY C. JESSUP, MLT. Performed at [facility], [place] [address], [place], [place] [address]    Culture KLEBSIELLA PNEUMONIAE (A)  Final   Report Status 10/07/2017 FINAL  Final   Organism ID, Bacteria KLEBSIELLA PNEUMONIAE  Final      Susceptibility   Klebsiella pneumoniae - MIC*    AMPICILLIN >=32 RESISTANT Resistant      CEFAZOLIN <=4 SENSITIVE Sensitive     CEFEPIME <=1 SENSITIVE Sensitive     CEFTAZIDIME <=1 SENSITIVE Sensitive     CEFTRIAXONE <=1 SENSITIVE Sensitive     CIPROFLOXACIN <=0.25 SENSITIVE Sensitive     GENTAMICIN <=1 SENSITIVE Sensitive     IMIPENEM <=0.25 SENSITIVE Sensitive     TRIMETH/SULFA <=20 SENSITIVE Sensitive     AMPICILLIN/SULBACTAM >=32 RESISTANT Resistant     PIP/TAZO 16 SENSITIVE Sensitive     Extended ESBL NEGATIVE Sensitive     * KLEBSIELLA PNEUMONIAE  Blood Culture ID Panel (Reflexed)     Status: Abnormal   Collection Time: 10/04/17  5:33 AM  Result Value Ref Range Status   Enterococcus species NOT DETECTED NOT DETECTED Final   Listeria monocytogenes NOT DETECTED NOT DETECTED Final   Staphylococcus species NOT DETECTED NOT DETECTED Final   Staphylococcus aureus NOT DETECTED NOT DETECTED Final   Streptococcus species NOT DETECTED NOT DETECTED Final   Streptococcus agalactiae NOT DETECTED NOT DETECTED Final   Streptococcus pneumoniae NOT DETECTED NOT DETECTED Final   Streptococcus pyogenes NOT DETECTED NOT DETECTED Final   Acinetobacter baumannii NOT DETECTED NOT DETECTED Final   Enterobacteriaceae species DETECTED (A) NOT DETECTED Final    Comment: Enterobacteriaceae represent a large family of gram-negative bacteria, not a single organism. CRITICAL RESULT CALLED TO, READ BACK BY AND VERIFIED WITH: J. GRIMSLEY, RPHARMD ([place]) AT 0308 ON 10/05/17 BY C. JESSUP, MLT.    Enterobacter cloacae complex NOT DETECTED NOT DETECTED Final   Escherichia coli NOT DETECTED NOT DETECTED Final   Klebsiella oxytoca NOT DETECTED NOT DETECTED Final   Klebsiella pneumoniae DETECTED (A) NOT DETECTED Final    Comment: CRITICAL RESULT CALLED TO, READ BACK BY AND VERIFIED WITH: J. GRIMSLEY, RPHARMD ([place]) AT 0308 ON 10/05/17 BY C. JESSUP, MLT.    Proteus species NOT DETECTED NOT DETECTED Final   Serratia marcescens NOT DETECTED NOT DETECTED Final   Carbapenem resistance NOT DETECTED NOT  DETECTED Final   Haemophilus influenzae NOT DETECTED NOT DETECTED Final   Neisseria meningitidis NOT DETECTED NOT DETECTED Final   Pseudomonas aeruginosa NOT DETECTED NOT DETECTED Final   Candida albicans NOT DETECTED NOT DETECTED Final   Candida glabrata NOT DETECTED NOT DETECTED Final   Candida krusei NOT DETECTED NOT DETECTED Final   Candida parapsilosis NOT DETECTED NOT DETECTED Final   Candida tropicalis NOT DETECTED NOT DETECTED Final    Comment: Performed at [facility], [place] [address] [place], [place] [address]  Culture, blood (Routine X 2) w Reflex to ID Panel     Status: None   Collection Time: 10/04/17  5:35 AM  Result Value Ref Range Status   Specimen Description BLOOD RIGHT ANTECUBITAL  Final   Special Requests   Final    BOTTLES DRAWN AEROBIC AND ANAEROBIC Blood Culture adequate volume   Culture   Final    NO GROWTH 5 DAYS Performed at [facility], [address] [place], [place] [address]    Report Status 10/09/2017 FINAL  Final  Culture, Urine     Status: Abnormal   Collection Time: 10/04/17  5:47 AM  Result Value Ref Range Status   Specimen Description URINE, RANDOM  Final   Special Requests NONE  Final   Culture MULTIPLE SPECIES PRESENT, SUGGEST RECOLLECTION (A)  Final   Report Status 10/05/2017 FINAL  Final  Aerobic/Anaerobic Culture (surgical/deep wound)     Status: Abnormal   Collection Time: 10/04/17 12:19 PM  Result Value Ref Range Status   Specimen Description ABSCESS  Final   Special Requests Normal  Final   Gram Stain   Final    ABUNDANT WBC PRESENT,BOTH PMN AND MONONUCLEAR ABUNDANT GRAM VARIABLE ROD FEW GRAM POSITIVE COCCI IN PAIRS IN CHAINS    Culture (A)  Final    MULTIPLE ORGANISMS PRESENT, NONE PREDOMINANT NO ANAEROBES ISOLATED Performed at [facility], [place] [address] [place], [place] [address]    Report Status 10/09/2017 FINAL  Final  Culture, blood (Routine X 2) w Reflex to ID Panel     Status: None (Preliminary  result)   Collection Time: 10/06/17  9:42 AM  Result Value Ref Range Status   Specimen Description BLOOD RIGHT ANTECUBITAL  Final   Special Requests   Final    BOTTLES DRAWN AEROBIC AND ANAEROBIC Blood Culture results may not be optimal due to an excessive volume of blood received in culture bottles   Culture   Final    NO GROWTH 3 DAYS Performed at [facility], [place] [address], [place], [place] [address]    Report Status PENDING  Incomplete  Culture, blood (Routine X 2) w Reflex to ID Panel     Status: None (Preliminary result)   Collection Time: 10/06/17  9:50 AM  Result Value Ref Range Status   Specimen Description BLOOD LEFT ANTECUBITAL  Final   Special Requests IN PEDIATRIC BOTTLE Blood Culture adequate volume  Final   Culture   Final    NO GROWTH 3 DAYS Performed at [facility], [place] [address] [place], [place] [address]    Report Status PENDING  Incomplete  Urine Culture     Status: None (Preliminary result)   Collection Time: 10/08/17  4:32 PM  Result Value Ref Range Status   Specimen Description KIDNEY LEFT  Final   Special Requests Normal  Final   Culture   Final    TOO YOUNG TO READ Performed at [facility], [place] [address], [place], [place] [address]    Report Status PENDING  Incomplete  Body fluid culture     Status: None (Preliminary result)   Collection Time: 10/08/17  6:16 PM  Result Value Ref Range Status   Specimen Description ABDOMEN  Final   Special Requests Normal  Final   Gram Stain   Final    MODERATE WBC PRESENT, PREDOMINANTLY PMN FEW YEAST WITH PSEUDOHYPHAE    Culture   Final    TOO YOUNG TO READ Performed at [facility], [place] [address] [place], [place] [address]    Report Status PENDING  Incomplete         Radiology Studies: Dg Chest 2 View  Result Date: 10/09/2017 CLINICAL DATA:  Fever, shortness of breath. EXAM: CHEST  2 VIEW COMPARISON:  Radiographs of September 26, 2012. FINDINGS: The heart size and  mediastinal contours are within normal limits. Hypoinflation of the lungs is noted with mild bibasilar subsegmental atelectasis. Minimal bilateral pleural effusions are noted. The visualized skeletal structures are unremarkable. IMPRESSION: Hypoinflation of the lungs with mild bibasilar subsegmental atelectasis and minimal bilateral pleural effusions. Electronically Signed   By: Marijo Conception, M.D.   On: 10/09/2017 13:04        Scheduled Meds: . enoxaparin (LOVENOX) injection  40 mg Subcutaneous Q24H  . pantoprazole  40 mg Oral Daily  . polyethylene glycol  17 g Oral Daily  . potassium chloride  40 mEq Oral Q4H  . protein supplement shake  11 oz Oral BID BM  . senna-docusate  2 tablet Oral BID  . sodium chloride flush  5 mL Intravenous Q8H   Continuous Infusions: . sodium chloride 100 mL/hr at 10/09/17 0624  . [START ON 10/10/2017] anidulafungin    . magnesium sulfate 1 - 4 g bolus IVPB    . piperacillin-tazobactam (ZOSYN)  IV 3.375 g (10/09/17 1625)     LOS: 5 days    Time spent: 29 minutes    Irine Seal, MD [facility] Pager [phone]  If 7PM-7AM, please contact night-coverage [email or web address] Password [facility] 10/09/2017, 6:45 PM

## 2017-10-10 ENCOUNTER — Other Ambulatory Visit: Payer: Self-pay

## 2017-10-10 DIAGNOSIS — T8143XA Infection following a procedure, organ and space surgical site, initial encounter: Secondary | ICD-10-CM

## 2017-10-10 DIAGNOSIS — R7881 Bacteremia: Secondary | ICD-10-CM

## 2017-10-10 DIAGNOSIS — C679 Malignant neoplasm of bladder, unspecified: Secondary | ICD-10-CM

## 2017-10-10 DIAGNOSIS — L0291 Cutaneous abscess, unspecified: Secondary | ICD-10-CM

## 2017-10-10 DIAGNOSIS — T8149XA Infection following a procedure, other surgical site, initial encounter: Secondary | ICD-10-CM

## 2017-10-10 LAB — BASIC METABOLIC PANEL
ANION GAP: 5 (ref 5–15)
BUN: 11 mg/dL (ref 6–20)
CO2: 23 mmol/L (ref 22–32)
Calcium: 7.5 mg/dL — ABNORMAL LOW (ref 8.9–10.3)
Chloride: 112 mmol/L — ABNORMAL HIGH (ref 101–111)
Creatinine, Ser: 0.93 mg/dL (ref 0.61–1.24)
GFR calc Af Amer: >60 mL/min (ref >60)
GFR calc non Af Amer: >60 mL/min (ref >60)
GLUCOSE: 138 mg/dL — AB (ref 65–99)
POTASSIUM: 3.4 mmol/L — AB (ref 3.5–5.1)
Sodium: 140 mmol/L (ref 135–145)

## 2017-10-10 LAB — CBC
HCT: 30 % — ABNORMAL LOW (ref 39.0–52.0)
Hemoglobin: 9.8 g/dL — ABNORMAL LOW (ref 13.0–17.0)
MCH: 25.2 pg — ABNORMAL LOW (ref 26.0–34.0)
MCHC: 32.7 g/dL (ref 30.0–36.0)
MCV: 77.1 fL — ABNORMAL LOW (ref 78.0–100.0)
Platelets: 236 10*3/uL (ref 150–400)
RBC: 3.89 MIL/uL — AB (ref 4.22–5.81)
RDW: 15.3 % (ref 11.5–15.5)
WBC: 10.5 10*3/uL (ref 4.0–10.5)

## 2017-10-10 LAB — URINE CULTURE: SPECIAL REQUESTS: NORMAL

## 2017-10-10 LAB — MAGNESIUM: Magnesium: 2.3 mg/dL (ref 1.7–2.4)

## 2017-10-10 MED ORDER — POTASSIUM CHLORIDE CRYS ER 20 MEQ PO TBCR
40.0000 meq | EXTENDED_RELEASE_TABLET | Freq: Once | ORAL | Status: AC
  Administered 2017-10-10: 40 meq via ORAL
  Filled 2017-10-10: qty 2

## 2017-10-10 MED ORDER — FLUCONAZOLE IN SODIUM CHLORIDE 400-0.9 MG/200ML-% IV SOLN
400.0000 mg | INTRAVENOUS | Status: DC
  Filled 2017-10-10: qty 200

## 2017-10-10 MED ORDER — PREMIER PROTEIN SHAKE
11.0000 [oz_av] | Freq: Two times a day (BID) | ORAL | Status: DC
  Administered 2017-10-11: 11 [oz_av] via ORAL
  Filled 2017-10-10 (×9): qty 325.31

## 2017-10-10 NOTE — Progress Notes (Signed)
Physical Therapy Treatment Patient Details Name: Kristopher Thompson, DDS MRN: [id number] DOB: [birth year] Today's Date: 10/10/2017    History of Present Illness 66 y.o. male,.. With history of prostate cancer, bladder cancer status post radical cystectomy and ileal conduit urinary diversion on 0/08/2724, complicated by rectal injury which was repaired by general surgery status post diverting colostomy came to hospital with complaints of abdominal pain for past 2 days. 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 Urology and surgery consulted and on board. He also underwent PCN on the left by IR on 11/26.    PT Comments    Significant improvement with ambulation today, pt walked 400' with RW, no loss of balance, vital signs stable.   Follow Up Recommendations  No PT follow up     Equipment Recommendations  Rolling walker with 5" wheels    Recommendations for Other Services       Precautions / Restrictions Precautions Precautions: Fall Precaution Comments: pt denies h/o falls; multiple drains, ostomy Restrictions Weight Bearing Restrictions: No    Mobility  Bed Mobility Overal bed mobility: Needs Assistance Bed Mobility: Rolling;Sidelying to Sit Rolling: Supervision Sidelying to sit: Supervision       General bed mobility comments: VCs for log roll technique  Transfers Overall transfer level: Needs assistance Equipment used: Rolling walker (2 wheeled) Transfers: Sit to/from Stand Sit to Stand: From elevated surface;Min guard         General transfer comment: VCs hand placement  Ambulation/Gait Ambulation/Gait assistance: Supervision Ambulation Distance (Feet): 400 Feet Assistive device: Rolling walker (2 wheeled) Gait Pattern/deviations: Step-through pattern   Gait velocity interpretation: at or above normal speed for age/gender General Gait Details: steady, no LOB, HR 90s, SaO2 94% room air, 1/4 dyspnea   Stairs             Wheelchair Mobility    Modified Rankin (Stroke Patients Only)       Balance Overall balance assessment: Needs assistance   Sitting balance-Leahy Scale: Good       Standing balance-Leahy Scale: Fair                              Cognition Arousal/Alertness: Awake/alert Behavior During Therapy: WFL for tasks assessed/performed Overall Cognitive Status: Within Functional Limits for tasks assessed                                        Exercises      General Comments        Pertinent Vitals/Pain Pain Assessment: No/denies pain    Home Living                      Prior Function            PT Goals (current goals can now be found in the care plan section) Acute Rehab PT Goals Patient Stated Goal: be able to walk more; 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 : A Little Difficulty sitting down on and standing up from a chair with arms (e.g., wheelchair, bedside commode, etc,.)?: A Little Help needed moving to and from a bed to chair (including a wheelchair)?: A Little Help needed walking in hospital room?: A Little Help needed climbing 3-5 steps with a railing? : A Lot 6 Click Score: 17    End of Session Equipment Utilized During Treatment: Gait belt Activity Tolerance: Patient tolerated treatment well Patient left: with call bell/phone within reach;with family/visitor present;in bed Nurse Communication: Mobility status PT  Visit Diagnosis: Difficulty in walking, not elsewhere classified (R26.2)     Time: 7356-7014 PT Time Calculation (min) (ACUTE ONLY): 31 min  Charges:  $Gait Training: 8-22 mins $Therapeutic Activity: 8-22 mins                    G Codes:          Philomena Doheny 10/10/2017, 10:08 AM [phone]

## 2017-10-10 NOTE — Progress Notes (Addendum)
Subjective: No new complaints   Antibiotics:  Anti-infectives (From admission, onward)   Start     Dose/Rate Route Frequency Ordered Stop   10/10/17 1000  anidulafungin (ERAXIS) 100 mg in sodium chloride 0.9 % 100 mL IVPB     100 mg 78 mL/hr over 100 Minutes Intravenous Every 24 hours 10/09/17 1122     10/09/17 1200  anidulafungin (ERAXIS) 200 mg in sodium chloride 0.9 % 200 mL IVPB     200 mg 78 mL/hr over 200 Minutes Intravenous  Once 10/09/17 1122 10/09/17 1640   10/05/17 1700  vancomycin (VANCOCIN) IVPB 1000 mg/200 mL premix  Status:  Discontinued     1,000 mg 200 mL/hr over 60 Minutes Intravenous Every 12 hours 10/05/17 1002 10/07/17 0915   10/04/17 0600  vancomycin (VANCOCIN) 1,750 mg in sodium chloride 0.9 % 500 mL IVPB  Status:  Discontinued     1,750 mg 250 mL/hr over 120 Minutes Intravenous Every 24 hours 10/04/17 0325 10/04/17 0445   10/04/17 0600  vancomycin (VANCOCIN) 1,250 mg in sodium chloride 0.9 % 250 mL IVPB  Status:  Discontinued     1,250 mg 166.7 mL/hr over 90 Minutes Intravenous Every 24 hours 10/04/17 0446 10/05/17 1002   10/04/17 0330  piperacillin-tazobactam (ZOSYN) IVPB 3.375 g     3.375 g 12.5 mL/hr over 240 Minutes Intravenous Every 8 hours 10/04/17 0322     10/04/17 0300  vancomycin (VANCOCIN) 1,500 mg in sodium chloride 0.9 % 500 mL IVPB  Status:  Discontinued     1,500 mg 250 mL/hr over 120 Minutes Intravenous  Once 10/04/17 0252 10/04/17 0321   10/04/17 0300  piperacillin-tazobactam (ZOSYN) IVPB 3.375 g  Status:  Discontinued     3.375 g 100 mL/hr over 30 Minutes Intravenous  Once 10/04/17 0252 10/04/17 0320      Medications: Scheduled Meds: . enoxaparin (LOVENOX) injection  40 mg Subcutaneous Q24H  . pantoprazole  40 mg Oral Daily  . polyethylene glycol  17 g Oral Daily  . protein supplement shake  11 oz Oral BID BM  . senna-docusate  2 tablet Oral BID  . sodium chloride flush  5 mL Other Q8H   Continuous Infusions: . sodium  chloride 75 mL/hr at 10/10/17 0930  . anidulafungin Stopped (10/10/17 1129)  . piperacillin-tazobactam (ZOSYN)  IV 3.375 g (10/10/17 1355)   PRN Meds:.acetaminophen, HYDROmorphone (DILAUDID) injection, iopamidol, lip balm, metoCLOPramide (REGLAN) injection, ondansetron **OR** ondansetron (ZOFRAN) IV, oxyCODONE-acetaminophen, phenol, promethazine    Objective: Weight change:   Intake/Output Summary (Last 24 hours) at 10/10/2017 1650 Last data filed at 10/10/2017 1500 Gross per 24 hour  Intake 4187.5 ml  Output 4300 ml  Net -112.5 ml   Blood pressure (!) 149/69, pulse 76, temperature 99.8 F (37.7 C), temperature source Oral, resp. rate 16, height 6' (1.829 m), weight 264 lb 8 oz (120 kg), SpO2 96 %. Temp:  [99.8 F (37.7 C)-101.7 F (38.7 C)] 99.8 F (37.7 C) (11/29 0405) Pulse Rate:  [73-76] 76 (11/29 1004) Resp:  [16] 16 (11/29 0405) BP: (132-149)/(69-76) 149/69 (11/29 1004) SpO2:  [96 %-98 %] 96 % (11/29 1004) Weight:  [264 lb 8 oz (120 kg)] 264 lb 8 oz (120 kg) (11/29 1500)  Physical Exam: General: Alert and awake, oriented x3  HEENT: anicteric sclera, EOMI CVS regular rate, normal r,  Chest:  no wheezing, or resp distress Abdomen: soft , nondistended,ostomy clean, ileiostomy clean, drain right side with urine, PCN on the  left with urine Extremities: no  clubbing or edema noted bilaterally Skin: no rashes Neuro: nonfocal  CBC: @LABBLAST3 (wbc3,Hgb:3,Hct:3,Plt:3,INR:3APTT:3)@   BMET Recent Labs    10/09/17 1647 10/10/17 0518  NA 137 140  K 2.8* 3.4*  CL 109 112*  CO2 23 23  GLUCOSE 109* 138*  BUN 13 11  CREATININE 0.81 0.93  CALCIUM 7.3* 7.5*     Liver Panel  No results for input(s): PROT, ALBUMIN, AST, ALT, ALKPHOS, BILITOT, BILIDIR, IBILI in the last 72 hours.     Sedimentation Rate No results for input(s): ESRSEDRATE in the last 72 hours. C-Reactive Protein No results for input(s): CRP in the last 72 hours.  Micro Results: Recent Results  (from the past 720 hour(s))  Culture, blood (Routine X 2) w Reflex to ID Panel     Status: Abnormal   Collection Time: 10/04/17  5:33 AM  Result Value Ref Range Status   Specimen Description BLOOD LEFT ANTECUBITAL  Final   Special Requests   Final    BOTTLES DRAWN AEROBIC AND ANAEROBIC Blood Culture adequate volume   Culture  Setup Time   Final    GRAM NEGATIVE RODS AEROBIC BOTTLE ONLY CRITICAL RESULT CALLED TO, READ BACK BY AND VERIFIED WITH: J. GRIMSLEY, RPHARMD ([place]) AT 0308 ON 10/05/17 BY C. JESSUP, MLT. Performed at [facility], [place] [address], [place], [place] [address]    Culture KLEBSIELLA PNEUMONIAE (A)  Final   Report Status 10/07/2017 FINAL  Final   Organism ID, Bacteria KLEBSIELLA PNEUMONIAE  Final      Susceptibility   Klebsiella pneumoniae - MIC*    AMPICILLIN >=32 RESISTANT Resistant     CEFAZOLIN <=4 SENSITIVE Sensitive     CEFEPIME <=1 SENSITIVE Sensitive     CEFTAZIDIME <=1 SENSITIVE Sensitive     CEFTRIAXONE <=1 SENSITIVE Sensitive     CIPROFLOXACIN <=0.25 SENSITIVE Sensitive     GENTAMICIN <=1 SENSITIVE Sensitive     IMIPENEM <=0.25 SENSITIVE Sensitive     TRIMETH/SULFA <=20 SENSITIVE Sensitive     AMPICILLIN/SULBACTAM >=32 RESISTANT Resistant     PIP/TAZO 16 SENSITIVE Sensitive     Extended ESBL NEGATIVE Sensitive     * KLEBSIELLA PNEUMONIAE  Blood Culture ID Panel (Reflexed)     Status: Abnormal   Collection Time: 10/04/17  5:33 AM  Result Value Ref Range Status   Enterococcus species NOT DETECTED NOT DETECTED Final   Listeria monocytogenes NOT DETECTED NOT DETECTED Final   Staphylococcus species NOT DETECTED NOT DETECTED Final   Staphylococcus aureus NOT DETECTED NOT DETECTED Final   Streptococcus species NOT DETECTED NOT DETECTED Final   Streptococcus agalactiae NOT DETECTED NOT DETECTED Final   Streptococcus pneumoniae NOT DETECTED NOT DETECTED Final   Streptococcus pyogenes NOT DETECTED NOT DETECTED Final   Acinetobacter baumannii NOT DETECTED  NOT DETECTED Final   Enterobacteriaceae species DETECTED (A) NOT DETECTED Final    Comment: Enterobacteriaceae represent a large family of gram-negative bacteria, not a single organism. CRITICAL RESULT CALLED TO, READ BACK BY AND VERIFIED WITH: J. GRIMSLEY, RPHARMD ([place]) AT 0308 ON 10/05/17 BY C. JESSUP, MLT.    Enterobacter cloacae complex NOT DETECTED NOT DETECTED Final   Escherichia coli NOT DETECTED NOT DETECTED Final   Klebsiella oxytoca NOT DETECTED NOT DETECTED Final   Klebsiella pneumoniae DETECTED (A) NOT DETECTED Final    Comment: CRITICAL RESULT CALLED TO, READ BACK BY AND VERIFIED WITH: J. GRIMSLEY, RPHARMD ([place]) AT 0308 ON 10/05/17 BY C. JESSUP, MLT.  Proteus species NOT DETECTED NOT DETECTED Final   Serratia marcescens NOT DETECTED NOT DETECTED Final   Carbapenem resistance NOT DETECTED NOT DETECTED Final   Haemophilus influenzae NOT DETECTED NOT DETECTED Final   Neisseria meningitidis NOT DETECTED NOT DETECTED Final   Pseudomonas aeruginosa NOT DETECTED NOT DETECTED Final   Candida albicans NOT DETECTED NOT DETECTED Final   Candida glabrata NOT DETECTED NOT DETECTED Final   Candida krusei NOT DETECTED NOT DETECTED Final   Candida parapsilosis NOT DETECTED NOT DETECTED Final   Candida tropicalis NOT DETECTED NOT DETECTED Final    Comment: Performed at [facility], [place] [address] [place], [place] [address]  Culture, blood (Routine X 2) w Reflex to ID Panel     Status: None   Collection Time: 10/04/17  5:35 AM  Result Value Ref Range Status   Specimen Description BLOOD RIGHT ANTECUBITAL  Final   Special Requests   Final    BOTTLES DRAWN AEROBIC AND ANAEROBIC Blood Culture adequate volume   Culture   Final    NO GROWTH 5 DAYS Performed at [facility], [place] [address] [place], [place] [address]    Report Status 10/09/2017 FINAL  Final  Culture, Urine     Status: Abnormal   Collection Time: 10/04/17  5:47 AM  Result Value Ref Range Status   Specimen  Description URINE, RANDOM  Final   Special Requests NONE  Final   Culture MULTIPLE SPECIES PRESENT, SUGGEST RECOLLECTION (A)  Final   Report Status 10/05/2017 FINAL  Final  Aerobic/Anaerobic Culture (surgical/deep wound)     Status: Abnormal   Collection Time: 10/04/17 12:19 PM  Result Value Ref Range Status   Specimen Description ABSCESS  Final   Special Requests Normal  Final   Gram Stain   Final    ABUNDANT WBC PRESENT,BOTH PMN AND MONONUCLEAR ABUNDANT GRAM VARIABLE ROD FEW GRAM POSITIVE COCCI IN PAIRS IN CHAINS    Culture (A)  Final    MULTIPLE ORGANISMS PRESENT, NONE PREDOMINANT NO ANAEROBES ISOLATED Performed at [facility], [place] [address] [place], [place] [address]    Report Status 10/09/2017 FINAL  Final  Culture, blood (Routine X 2) w Reflex to ID Panel     Status: None (Preliminary result)   Collection Time: 10/06/17  9:42 AM  Result Value Ref Range Status   Specimen Description BLOOD RIGHT ANTECUBITAL  Final   Special Requests   Final    BOTTLES DRAWN AEROBIC AND ANAEROBIC Blood Culture results may not be optimal due to an excessive volume of blood received in culture bottles   Culture   Final    NO GROWTH 4 DAYS Performed at [facility], [place] [address], [place], [place] [address]    Report Status PENDING  Incomplete  Culture, blood (Routine X 2) w Reflex to ID Panel     Status: None (Preliminary result)   Collection Time: 10/06/17  9:50 AM  Result Value Ref Range Status   Specimen Description BLOOD LEFT ANTECUBITAL  Final   Special Requests IN PEDIATRIC BOTTLE Blood Culture adequate volume  Final   Culture   Final    NO GROWTH 4 DAYS Performed at [facility], [place] [address] [place], [place] [address]    Report Status PENDING  Incomplete  Urine Culture     Status: Abnormal   Collection Time: 10/08/17  4:32 PM  Result Value Ref Range Status   Specimen Description KIDNEY LEFT  Final   Special Requests Normal  Final   Culture 50,000  COLONIES/mL CANDIDA ALBICANS (A)  Final   Report Status 10/10/2017 FINAL  Final  Body fluid culture     Status: None (Preliminary result)   Collection Time: 10/08/17  6:16 PM  Result Value Ref Range Status   Specimen Description ABDOMEN  Final   Special Requests Normal  Final   Gram Stain   Final    MODERATE WBC PRESENT, PREDOMINANTLY PMN FEW YEAST WITH PSEUDOHYPHAE    Culture   Final    MODERATE CANDIDA ALBICANS FEW KLEBSIELLA PNEUMONIAE SUSCEPTIBILITIES TO FOLLOW Performed at [facility], [place] [address], [place], [place] [address]    Report Status PENDING  Incomplete    Studies/Results: Dg Chest 2 View  Result Date: 10/09/2017 CLINICAL DATA:  Fever, shortness of breath. EXAM: CHEST  2 VIEW COMPARISON:  Radiographs of September 26, 2012. FINDINGS: The heart size and mediastinal contours are within normal limits. Hypoinflation of the lungs is noted with mild bibasilar subsegmental atelectasis. Minimal bilateral pleural effusions are noted. The visualized skeletal structures are unremarkable. IMPRESSION: Hypoinflation of the lungs with mild bibasilar subsegmental atelectasis and minimal bilateral pleural effusions. Electronically Signed   By: Marijo Conception, M.D.   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 male with  with hx of prostate cancer, bladder cancer sp radical cystecomty and ileal conduit with urinary diversion on 11/26/70 complicated by rectal injury which was repaired by general surgery status post  diverting colostomy admitted with sepsis and intrabdominal infected urinoma due to delayed urinary leak from anastomosis, also with klebsiella PNA bacteremia. He is sp IR drainge of urinoma and left sided PCN. Initial cultures from urinoma grew mx organisms, repeat cultures are growing candida and klebsiella (though likely taken through line) left kidney culture which was obtained with PCN is also growing candida albicans  #1 Infected urionma/abscess: given C albicans will change to fluconazole and this can eventually be converted to po, will continue this with zosyn for now  #2 Klebsiella PNA bacteremia: repeat blood cultures no growth will complete at least 10 days of effective therapy with duration more driven by #1     LOS: 6 days   Alcide Evener 10/10/2017, 4:50 PM

## 2017-10-10 NOTE — Progress Notes (Signed)
Nutrition Follow-up  DOCUMENTATION CODES:   Obesity unspecified, Non-severe (moderate) malnutrition in context of chronic illness  INTERVENTION:  - Continue Premier Protein BID.  - Will order Magic cup TID with meals, each supplement provides 290 kcal and 9 grams of protein - Continue to encourage PO intakes, as tolerated.  - RD will continue to monitor for needs.   NUTRITION DIAGNOSIS:   Moderate Malnutrition related to chronic illness(bladder/prostate cancer) as evidenced by percent weight loss, energy intake < or equal to 50% for > or equal to 1 month. -ongoing  GOAL:   Patient will meet greater than or equal to 90% of their needs -unmet  MONITOR:   PO intake, Supplement acceptance, Weight trends, Labs  ASSESSMENT:   Pt with PMH significant for prostate cancer, bladder cancer s/p radical cystectomy and ileal conduit urinary diversion 5/99/3570 complicated by rectal injury s/p diverting colostomy. Presents this admission with complaints of abdominal pain for the two days. Admitted for sepsis and AKI.   11/29 Per chart review, pt ate 40% of breakfast and 20% of lunch on 11/27 with no other documented intakes since last RD assessment. Pt reports nausea even looking at the menu or when certain foods are mentioned (such as chicken and french fries). He is tolerating popsicles, [place] ice, fruit, and is trying his best to drink Premier Protein shakes. He confirms that taste has been "off." Several family members/visitors at bedside and there is a very large basket full of snacks and a 6 pack of diet Coke bottles in the room. Pt uninterested in these items.   He had L nephrostomy tube placed on 11/26. He denies nausea with consuming items listed above and denies abdominal pain with PO intakes. No new weight since admission. He and family/visitors deny nutrition-related questions at this time. Will continue to follow closely and will also communicate with [facility] RD.   Medications reviewed;  4 g IV Mg sulfate x1 run yesterday, 40 mg oral Protonix/day, 1 packet Miralax/day, 40 mEq oral KCl x4 doses yesterday and x1 dose today, 2 tablets Senokot BID.  Labs reviewed; K: 3.4 mmol/L, Cl: 112 mmol/L, Ca: 7.5 mg/dL.  IVF: NS @ 75 mL/hr.      11/23 - Reports having loss in appetite since bladder cancer diagnosis in September 2018.  - States he typically eats three small meal per day that consist of chicken nuggets, egg, and chicken tenders.  - Admits to having taste changes since colostomy sugery that contribute to his loss in appetite.  - Pt amendable to supplementation this hospital stay.  - Pt reports a UBW of 265 lb, the last time being at that weight in September 2018.  - Records indicate pt weighed 268 lb 07/2017 and weighs 252 lb this stay.  - This shows a 6% weight loss in one month which is significant.       Diet Order:  Diet regular Room service appropriate? Yes; Fluid consistency: Thin  EDUCATION NEEDS:   Education needs have been addressed  Skin:  Skin Assessment: Skin Integrity Issues: Skin Integrity Issues:: Incisions Incisions: Abdominal (11/23)  Last BM:  11/28  Height:   Ht Readings from Last 1 Encounters:  10/04/17 6' (1.829 m)    Weight:   Wt Readings from Last 1 Encounters:  10/04/17 252 lb 13.9 oz (114.7 kg)    Ideal Body Weight:  80.9 kg  BMI:  Body mass index is 34.29 kg/m.  Estimated Nutritional Needs:   Kcal:  2100-2300 kcal/day  Protein:  105-115 g/day  Fluid:  >2.1 L/day      Jarome Matin, MS, RD, LDN, [facility] Clinical Dietitian Pager # [phone] After hours/weekend pager # [phone]

## 2017-10-10 NOTE — Progress Notes (Signed)
PROGRESS NOTE    AMBROSIO REUTER, DDS  OIZ:[id number] DOB: 1951/07/30 DOA: 10/04/2017 PCP: Lorene Dy, MD    Brief Narrative:  [name] a66 y.o.male,..With history of prostate cancer, bladder cancer status post radical cystectomy and ileal conduit urinary diversion on 3/38/2505, complicated by rectal injury which was repaired by general surgery status post diverting colostomy came to hospital with complaints of abdominal pain for past 2 days.Pt underwentCT guided percutaneous catheter drainage of urinoma. Urology and surgery consulted and on board. He also underwent PCN on the left by IR on 11/26. Pt continues to have fevers, on broad spectrum antibiotics.  ID was consulted and antifungal regimen added with fever curve trending down.      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 Status post ureteral stent removal 1120, status post percutaneous catheter drainage of urinoma 1123 with continued clear urine. Patient was pancultured blood cultures from 10/04/2017 positive for Klebsiella pneumonia. Repeat blood cultures from 10/06/2017 with no growth to date. Anaerobic and aerobic cultures with abundant WBCs, multiple organisms present none predominant no anaerobes isolated. Body fluid culture from 10/08/2017 with moderate WBC, few yeast with pseudohyphae, moderate Candida albicans, few Klebsiella pneumonia susceptibilities  pending. Patient fever curve trending down after the addition of antifungal.  Urine cultures from 10/08/2017 with 50,000 colonies of Candida albicans.  Continue empiric IV Zosyn and IV Eraxis.  ID following and I appreciate their input and recommendations.    #2. Klebsiella pneumonia bacteremia Fever curve trending down after patient being started on antifungal 10/09/2017.  Repeat blood cultures from 10/06/2017 still with no growth to date.  Sensitivities have resulted and resistant to ampicillin and ampicillin/sulbactam however sensitive to Zosyn.  IV vancomycin was discontinued 10/05/2017.  Continue IV Zosyn.  ID following.  #3 hypokalemia/hypophosphatemia Replete.  #4 anemia of chronic disease H&H stable.  #5 delayed urine leak status post radical cystectomy and ileal conduit 08/08/2017 Patient being followed by urology who recommended continuing abdominal drain and nephrostomy drainage with monitoring of outputs. Per urology.   DVT prophylaxis: SCDs Code Status: Full Family Communication: Updated patient and wife at bedside. Disposition Plan: Likely home with home health once patient is afebrile for 48 hours with clinical improvement.   Consultants:   Urology: Dr. Junious Silk 10/04/2017  Interventional radiologist: Dr. Kathlene Cote 10/04/2017  ID Dr. Tommy Medal 10/09/2017  Procedures:   CT abdomen and pelvis 10/06/2017, 10/04/2017  Chest x-ray 10/09/2017  CT-guided percutaneous catheter drainage of urinoma anterior to ileal conduit per Dr. Kathlene Cote 10/04/2017  Ultrasound and fluoroscopy scopic guided placement of left sided 34 French or cutaneous nephrostomy, antegrade nephrostogram demonstrates a distal left ureteral injury with extravasation of contrast communicating with percutaneous drainage catheter per Dr. Pascal Lux 10/07/2017   Antimicrobials:   IV Eraxis 10/09/2017  IV vancomycin 10/04/2017>>>>10/05/2017  IV Zosyn 10/04/2017   Subjective: Patient sitting up in bed.   Patient states sleeping most of the morning.  Patient states ambulated in hallway with walker with physical therapy  today.  Patient endorses some shortness of breath.  Patient denies any chest pain.  Objective: Vitals:   10/09/17 1900 10/09/17 2040 10/10/17 0405 10/10/17 1004  BP:  132/70 (!) 141/76 (!) 149/69  Pulse:  74 73 76  Resp:  16 16   Temp: (!) 101.7 F (38.7 C) 100.1 F (37.8 C) 99.8 F (37.7 C)   TempSrc: Oral Oral Oral   SpO2:  97% 98% 96%  Weight:      Height:        Intake/Output Summary (Last 24 hours) at 10/10/2017 1300 Last data filed at 10/10/2017 1039 Gross per 24 hour  Intake 2681.67 ml  Output 4575 ml  Net -1893.33 ml   Filed Weights   10/04/17 0111  Weight: 114.7 kg (252 lb 13.9 oz)    Examination:  General exam: Appears calm and comfortable.NAD. Respiratory system: Some scattered crackles.  No wheezing.  No rhonchi. Respiratory effort normal. Cardiovascular system: S1 & S2 heard, RRR. No JVD, murmurs, rubs, gallops or clicks. No pedal edema. Gastrointestinal system: Abdomen is soft, nontender, nondistended. No organomegaly or masses felt. Colostomy bag with stool. Nephrostomy bag draining. Urostomy bag draining. Normal bowel sounds heard. Central nervous system: Alert and oriented. No focal neurological deficits. Extremities: Symmetric 5 x 5 power. Skin: No rashes, lesions or ulcers Psychiatry: Judgement and insight appear normal. Mood & affect appropriate.     Data Reviewed: I have personally reviewed following labs and imaging studies  CBC: Recent Labs  Lab 10/06/17 0431 10/07/17 0456 10/08/17 0510 10/09/17 0506 10/10/17 0518  WBC 14.9* 14.9* 11.8* 9.1 10.5  HGB 9.6* 10.3* 9.9* 9.6* 9.8*  HCT 31.0* 32.2* 30.3* 29.8* 30.0*  MCV 81.6 78.3 77.3* 77.4* 77.1*  PLT 252 312 273 229 536   Basic Metabolic Panel: Recent Labs  Lab 10/06/17 1333 10/07/17 0456 10/08/17 1943 10/09/17 0506 10/09/17 1647 10/10/17 0518  NA 137 141 140 137  137 140  K 3.1* 3.2* 2.8* 2.8* 2.8* 3.4*  CL 107 112* 109 110 109 112*  CO2 23 20* 24 22 23 23   GLUCOSE 99 107* 101* 93 109* 138*  BUN 17 19 13 12 13 11   CREATININE 0.90 0.85 0.90 0.87 0.81 0.93  CALCIUM 7.9* 8.1* 7.4* 7.2* 7.3* 7.5*  MG 2.0  --   --  1.8  --  2.3  PHOS 1.7*  --   --   --   --   --    GFR: Estimated Creatinine Clearance: 103.5 mL/min (by C-G formula based on SCr of 0.93 mg/dL). Liver Function Tests: Recent Labs  Lab 10/04/17 0522 10/05/17 0602 10/07/17 0456  AST 28 16 14*  ALT 31 21 15*  ALKPHOS 67 61 64  BILITOT 0.7 0.8 0.6  PROT 6.5 5.6* 5.9*  ALBUMIN 2.7* 2.1* 2.3*   No results for input(s): LIPASE, AMYLASE in the last 168 hours. No results for input(s): AMMONIA in the last 168 hours. Coagulation Profile: Recent Labs  Lab 10/07/17 0947  INR 1.18   Cardiac Enzymes: No results for input(s): CKTOTAL, CKMB, CKMBINDEX, TROPONINI in the last 168 hours. BNP (last 3 results) No results for input(s): PROBNP in the last 8760 hours. HbA1C: No results for input(s): HGBA1C in the last 72 hours. CBG: No results for input(s): GLUCAP in the last 168 hours. Lipid Profile: No results for input(s): CHOL, HDL, LDLCALC, TRIG, CHOLHDL, LDLDIRECT in the last 72 hours. Thyroid Function Tests: No results for input(s): TSH, T4TOTAL, FREET4, T3FREE, THYROIDAB in the last 72  hours. Anemia Panel: No results for input(s): VITAMINB12, FOLATE, FERRITIN, TIBC, IRON, RETICCTPCT in the last 72 hours. Sepsis Labs: Recent Labs  Lab 10/04/17 0522 10/04/17 0813 10/05/17 0602 10/06/17 0431  PROCALCITON 4.58  --  5.36 3.76  LATICACIDVEN 1.7 2.0* 1.3  --     Recent Results (from the past 240 hour(s))  Culture, blood (Routine X 2) w Reflex to ID Panel     Status: Abnormal   Collection Time: 10/04/17  5:33 AM  Result Value Ref Range Status   Specimen Description BLOOD LEFT ANTECUBITAL  Final   Special Requests   Final    BOTTLES DRAWN AEROBIC AND ANAEROBIC Blood Culture  adequate volume   Culture  Setup Time   Final    GRAM NEGATIVE RODS AEROBIC BOTTLE ONLY CRITICAL RESULT CALLED TO, READ BACK BY AND VERIFIED WITH: J. GRIMSLEY, RPHARMD (WL) AT 0308 ON 10/05/17 BY C. JESSUP, MLT. Performed at [facility], [place] [address], [place], [place] [address]    Culture KLEBSIELLA PNEUMONIAE (A)  Final   Report Status 10/07/2017 FINAL  Final   Organism ID, Bacteria KLEBSIELLA PNEUMONIAE  Final      Susceptibility   Klebsiella pneumoniae - MIC*    AMPICILLIN >=32 RESISTANT Resistant     CEFAZOLIN <=4 SENSITIVE Sensitive     CEFEPIME <=1 SENSITIVE Sensitive     CEFTAZIDIME <=1 SENSITIVE Sensitive     CEFTRIAXONE <=1 SENSITIVE Sensitive     CIPROFLOXACIN <=0.25 SENSITIVE Sensitive     GENTAMICIN <=1 SENSITIVE Sensitive     IMIPENEM <=0.25 SENSITIVE Sensitive     TRIMETH/SULFA <=20 SENSITIVE Sensitive     AMPICILLIN/SULBACTAM >=32 RESISTANT Resistant     PIP/TAZO 16 SENSITIVE Sensitive     Extended ESBL NEGATIVE Sensitive     * KLEBSIELLA PNEUMONIAE  Blood Culture ID Panel (Reflexed)     Status: Abnormal   Collection Time: 10/04/17  5:33 AM  Result Value Ref Range Status   Enterococcus species NOT DETECTED NOT DETECTED Final   Listeria monocytogenes NOT DETECTED NOT DETECTED Final   Staphylococcus species NOT DETECTED NOT DETECTED Final   Staphylococcus aureus NOT DETECTED NOT DETECTED Final   Streptococcus species NOT DETECTED NOT DETECTED Final   Streptococcus agalactiae NOT DETECTED NOT DETECTED Final   Streptococcus pneumoniae NOT DETECTED NOT DETECTED Final   Streptococcus pyogenes NOT DETECTED NOT DETECTED Final   Acinetobacter baumannii NOT DETECTED NOT DETECTED Final   Enterobacteriaceae species DETECTED (A) NOT DETECTED Final    Comment: Enterobacteriaceae represent a large family of gram-negative bacteria, not a single organism. CRITICAL RESULT CALLED TO, READ BACK BY AND VERIFIED WITH: J. GRIMSLEY, RPHARMD (WL) AT 0308 ON 10/05/17 BY C.  JESSUP, MLT.    Enterobacter cloacae complex NOT DETECTED NOT DETECTED Final   Escherichia coli NOT DETECTED NOT DETECTED Final   Klebsiella oxytoca NOT DETECTED NOT DETECTED Final   Klebsiella pneumoniae DETECTED (A) NOT DETECTED Final    Comment: CRITICAL RESULT CALLED TO, READ BACK BY AND VERIFIED WITH: J. GRIMSLEY, RPHARMD (WL) AT 0308 ON 10/05/17 BY C. JESSUP, MLT.    Proteus species NOT DETECTED NOT DETECTED Final   Serratia marcescens NOT DETECTED NOT DETECTED Final   Carbapenem resistance NOT DETECTED NOT DETECTED Final   Haemophilus influenzae NOT DETECTED NOT DETECTED Final   Neisseria meningitidis NOT DETECTED NOT DETECTED Final   Pseudomonas aeruginosa NOT DETECTED NOT DETECTED Final   Candida albicans NOT DETECTED NOT DETECTED Final   Candida glabrata NOT DETECTED  NOT DETECTED Final   Candida krusei NOT DETECTED NOT DETECTED Final   Candida parapsilosis NOT DETECTED NOT DETECTED Final   Candida tropicalis NOT DETECTED NOT DETECTED Final    Comment: Performed at [facility], [address] [place], [place] [address]  Culture, blood (Routine X 2) w Reflex to ID Panel     Status: None   Collection Time: 10/04/17  5:35 AM  Result Value Ref Range Status   Specimen Description BLOOD RIGHT ANTECUBITAL  Final   Special Requests   Final    BOTTLES DRAWN AEROBIC AND ANAEROBIC Blood Culture adequate volume   Culture   Final    NO GROWTH 5 DAYS Performed at [facility], [place] [address] [place], [place] [address]    Report Status 10/09/2017 FINAL  Final  Culture, Urine     Status: Abnormal   Collection Time: 10/04/17  5:47 AM  Result Value Ref Range Status   Specimen Description URINE, RANDOM  Final   Special Requests NONE  Final   Culture MULTIPLE SPECIES PRESENT, SUGGEST RECOLLECTION (A)  Final   Report Status 10/05/2017 FINAL  Final  Aerobic/Anaerobic Culture (surgical/deep wound)     Status: Abnormal   Collection Time: 10/04/17 12:19 PM  Result Value Ref  Range Status   Specimen Description ABSCESS  Final   Special Requests Normal  Final   Gram Stain   Final    ABUNDANT WBC PRESENT,BOTH PMN AND MONONUCLEAR ABUNDANT GRAM VARIABLE ROD FEW GRAM POSITIVE COCCI IN PAIRS IN CHAINS    Culture (A)  Final    MULTIPLE ORGANISMS PRESENT, NONE PREDOMINANT NO ANAEROBES ISOLATED Performed at [facility], [place] [address] [place], [place] [address]    Report Status 10/09/2017 FINAL  Final  Culture, blood (Routine X 2) w Reflex to ID Panel     Status: None (Preliminary result)   Collection Time: 10/06/17  9:42 AM  Result Value Ref Range Status   Specimen Description BLOOD RIGHT ANTECUBITAL  Final   Special Requests   Final    BOTTLES DRAWN AEROBIC AND ANAEROBIC Blood Culture results may not be optimal due to an excessive volume of blood received in culture bottles   Culture   Final    NO GROWTH 3 DAYS Performed at [facility], [place] [address], [place], [place] [address]    Report Status PENDING  Incomplete  Culture, blood (Routine X 2) w Reflex to ID Panel     Status: None (Preliminary result)   Collection Time: 10/06/17  9:50 AM  Result Value Ref Range Status   Specimen Description BLOOD LEFT ANTECUBITAL  Final   Special Requests IN PEDIATRIC BOTTLE Blood Culture adequate volume  Final   Culture   Final    NO GROWTH 3 DAYS Performed at [facility], [place] [address] [place], [place] [address]    Report Status PENDING  Incomplete  Urine Culture     Status: None (Preliminary result)   Collection Time: 10/08/17  4:32 PM  Result Value Ref Range Status   Specimen Description KIDNEY LEFT  Final   Special Requests Normal  Final   Culture   Final    CULTURE REINCUBATED FOR BETTER GROWTH Performed at [facility], [place] [address], [place], [place] [address]    Report Status PENDING  Incomplete  Body fluid culture     Status: None (Preliminary result)   Collection Time: 10/08/17  6:16 PM  Result  Value Ref Range Status    Specimen Description ABDOMEN  Final   Special Requests Normal  Final   Gram Stain   Final    MODERATE WBC PRESENT, PREDOMINANTLY PMN FEW YEAST WITH PSEUDOHYPHAE    Culture   Final    MODERATE CANDIDA ALBICANS FEW KLEBSIELLA PNEUMONIAE SUSCEPTIBILITIES TO FOLLOW Performed at [facility], [place] [address], [place], [place] [address]    Report Status PENDING  Incomplete         Radiology Studies: Dg Chest 2 View  Result Date: 10/09/2017 CLINICAL DATA:  Fever, shortness of breath. EXAM: CHEST  2 VIEW COMPARISON:  Radiographs of September 26, 2012. FINDINGS: The heart size and mediastinal contours are within normal limits. Hypoinflation of the lungs is noted with mild bibasilar subsegmental atelectasis. Minimal bilateral pleural effusions are noted. The visualized skeletal structures are unremarkable. IMPRESSION: Hypoinflation of the lungs with mild bibasilar subsegmental atelectasis and minimal bilateral pleural effusions. Electronically Signed   By: Marijo Conception, M.D.   On: 10/09/2017 13:04        Scheduled Meds: . enoxaparin (LOVENOX) injection  40 mg Subcutaneous Q24H  . pantoprazole  40 mg Oral Daily  . polyethylene glycol  17 g Oral Daily  . protein supplement shake  11 oz Oral BID BM  . senna-docusate  2 tablet Oral BID  . sodium chloride flush  5 mL Other Q8H   Continuous Infusions: . sodium chloride 75 mL/hr at 10/10/17 0930  . anidulafungin Stopped (10/10/17 1129)  . piperacillin-tazobactam (ZOSYN)  IV Stopped (10/10/17 0933)     LOS: 6 days    Time spent: 40 minutes    Irine Seal, MD [facility] Pager [phone]  If 7PM-7AM, please contact night-coverage [email or web address] Password [email or web address] 10/10/2017, 1:00 PM

## 2017-10-10 NOTE — Progress Notes (Signed)
Patient ID: [name], DDS, male   DOB: [birth year], 66 y.o.   MRN: [id number]    Subjective: Pt with continued fever yesterday.  Seen by ID (Dr. Tommy Medal) and placed on antifungal treatment for yeast in abdominal drain fluid (this drain has been in since late last week so whether colonization vs infection is unclear.  Initially fluid from drain site was reportedly serous and did not appear obviously infected.  Today, [name] feels mildly better  His fever curve has been decreased overnight.  His nausea is present but mild.  He has been able to tolerate some fruit and liquids.  Objective: Vital signs in last 24 hours: Temp:  [99.8 F (37.7 C)-102.2 F (39 C)] 99.8 F (37.7 C) (11/29 0405) Pulse Rate:  [63-74] 73 (11/29 0405) Resp:  [16-20] 16 (11/29 0405) BP: (120-141)/(59-76) 141/76 (11/29 0405) SpO2:  [97 %-100 %] 98 % (11/29 0405)  Intake/Output from previous day: 11/28 0701 - 11/29 0700 In: 2561.7 [I.V.:2311.7; IV Piggyback:250] Out: 7893 [Urine:3675; Drains:175; Stool:325] Intake/Output this shift: Total I/O In: 1296.7 [I.V.:1096.7; IV Piggyback:200] Out: 2175 [Urine:2025; Drains:75; Stool:75] - both nephrostomy and urostomy draining well  Physical Exam:  General: Alert and oriented CV: RRR Lungs: Clear Abdomen: Soft, ND, NT, Urostomy pink and draining grossly clear urine, Abdominal drain with serous/yellow fluid, L nephrostomy drainage clear, Colostomy with stool in bag. Incisions: Wound is clean and continues to heal well. Ext: NT, No erythema  Lab Results: Recent Labs    10/08/17 0510 10/09/17 0506 10/10/17 0518  HGB 9.9* 9.6* 9.8*  HCT 30.3* 29.8* 30.0*   CBC Latest Ref Rng & Units 10/10/2017 10/09/2017 10/08/2017  WBC 4.0 - 10.5 K/uL 10.5 9.1 11.8(H)  Hemoglobin 13.0 - 17.0 g/dL 9.8(L) 9.6(L) 9.9(L)  Hematocrit 39.0 - 52.0 % 30.0(L) 29.8(L) 30.3(L)  Platelets 150 - 400 K/uL 236 229 273     BMET Recent Labs    10/09/17 1647 10/10/17 0518  NA 137 140   K 2.8* 3.4*  CL 109 112*  CO2 23 23  GLUCOSE 109* 138*  BUN 13 11  CREATININE 0.81 0.93  CALCIUM 7.3* 7.5*     Studies/Results: Dg Chest 2 View  Result Date: 10/09/2017 CLINICAL DATA:  Fever, shortness of breath. EXAM: CHEST  2 VIEW COMPARISON:  Radiographs of September 26, 2012. FINDINGS: The heart size and mediastinal contours are within normal limits. Hypoinflation of the lungs is noted with mild bibasilar subsegmental atelectasis. Minimal bilateral pleural effusions are noted. The visualized skeletal structures are unremarkable. IMPRESSION: Hypoinflation of the lungs with mild bibasilar subsegmental atelectasis and minimal bilateral pleural effusions. Electronically Signed   By: Marijo Conception, M.D.   On: 10/09/2017 13:04    Assessment/Plan: Delayed urine leak s/p radical cystectomy/ileal conduit 2 months ago  1) Urine leak: Continue abdominal drain and left nephrostomy.  I will likely plan to leave both in place until next nephrostogram in about 2 weeks to ensure that leak has resolved before removal of drain. Outputs appear appropriate.  Will consider checking drain Cr level in next couple of days.  2) Fever: Unclear source.  Fever curve improved on antifungal agent.  No clear indication to repeat scan right now but will defer to ID especially if recurrent fever develops. He remains on Zosyn for Klebsiella blood culture. WBC fairly stable.  3) GI: Encouraged patient to continue to improve his po intake.  4) Disp: I encouraged him to work with PT today.  Will need to determine home  health needs vs SNF placement with drain care and ambulatory status and need for PT as outpatient.   LOS: 6 days   Zaela Graley,LES 10/10/2017, 6:48 AM

## 2017-10-10 NOTE — Progress Notes (Signed)
Stopped by for a social visit.  Pt in decreased spirits due to his most recent issues.  He states that today is the best he has felt in a week though.  Ostomy functioning well.  Please call me if I can be if any assistance while he is in the hospital.  Can f/u with me in the office once urinary issues are better.    Rosario Adie, MD  [facility]

## 2017-10-10 NOTE — Progress Notes (Signed)
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 Now growing C albicans and transitioning to Diflucan per pharmacy.   Plan:  Diflucan 400 mg IV q24 hr  Note patient had long QTc back in September and not currently on telemetry monitoring. EKG has been ordered; f/u if still long  Height: 6' (182.9 cm) Weight: 264 lb 8 oz (120 kg) IBW/kg (Calculated) : 77.6  Temp (24hrs), Avg:100.5 F (38.1 C), Min:99.8 F (37.7 C), Max:101.7 F (38.7 C)  Recent Labs  Lab 10/04/17 0522 10/04/17 0813 10/05/17 0602 10/06/17 0431  10/07/17 0456 10/08/17 0510 10/08/17 1943 10/09/17 0506 10/09/17 1647 10/10/17 0518  WBC 18.4*  --  12.9* 14.9*  --  14.9* 11.8*  --  9.1  --  10.5  CREATININE 1.66*  --  0.99 1.03   < > 0.85  --  0.90 0.87 0.81 0.93  LATICACIDVEN 1.7 2.0* 1.3  --   --   --   --   --   --   --   --    < > = values in this interval not displayed.    Estimated Creatinine Clearance: 106 mL/min (by C-G formula based on SCr of 0.93 mg/dL).    Allergies  Allergen Reactions  . Demerol [Meperidine] Other (See Comments)    SEVERE NAUSEA    Antimicrobials this admission:  11/23 Vancomycin >> 11/26 11/23 Zosyn >>   11/28 eraxis >> 11/29 11/29 diflucan >>   Dose adjustments this admission:  11/24: changed vanc from 1250 q24h to 1gm q12h (est AUC 459 with scr 0.99)  Microbiology results:  11/23 BCx x2: 1/2 GNR (BCID= klebsiella pneum) 11/23 UCx: multi species FINAL 11/23 abscess: abundant Gram variable rods, few GPC in pairs and chains 11/25 BCx2>>ngtd 11/27 abd fluid>>few yeast w/ pseudohyphae >> mod C Albicans 11/27 Ucx (PCN L kidney) >> 50k C albicans  Thank you for allowing pharmacy to be a part of this patient's care.  Reuel Boom, PharmD, BCPS Pager: [phone] 10/10/2017, 10:17 PM

## 2017-10-11 DIAGNOSIS — B379 Candidiasis, unspecified: Secondary | ICD-10-CM

## 2017-10-11 LAB — HCV COMMENT:

## 2017-10-11 LAB — HEPATITIS C ANTIBODY (REFLEX)

## 2017-10-11 LAB — BASIC METABOLIC PANEL
ANION GAP: 5 (ref 5–15)
BUN: 9 mg/dL (ref 6–20)
CHLORIDE: 112 mmol/L — AB (ref 101–111)
CO2: 24 mmol/L (ref 22–32)
Calcium: 7.4 mg/dL — ABNORMAL LOW (ref 8.9–10.3)
Creatinine, Ser: 0.75 mg/dL (ref 0.61–1.24)
GFR calc Af Amer: >60 mL/min (ref >60)
Glucose, Bld: 103 mg/dL — ABNORMAL HIGH (ref 65–99)
POTASSIUM: 3 mmol/L — AB (ref 3.5–5.1)
SODIUM: 141 mmol/L (ref 135–145)

## 2017-10-11 LAB — CBC WITH DIFFERENTIAL/PLATELET
BASOS ABS: 0 10*3/uL (ref 0.0–0.1)
Basophils Relative: 0 %
EOS ABS: 0.4 10*3/uL (ref 0.0–0.7)
EOS PCT: 4 %
HCT: 29.4 % — ABNORMAL LOW (ref 39.0–52.0)
HEMOGLOBIN: 9.4 g/dL — AB (ref 13.0–17.0)
LYMPHS ABS: 1.3 10*3/uL (ref 0.7–4.0)
LYMPHS PCT: 13 %
MCH: 24.5 pg — AB (ref 26.0–34.0)
MCHC: 32 g/dL (ref 30.0–36.0)
MCV: 76.8 fL — AB (ref 78.0–100.0)
Monocytes Absolute: 0.8 10*3/uL (ref 0.1–1.0)
Monocytes Relative: 8 %
NEUTROS PCT: 75 %
Neutro Abs: 7.5 10*3/uL (ref 1.7–7.7)
PLATELETS: 235 10*3/uL (ref 150–400)
RBC: 3.83 MIL/uL — AB (ref 4.22–5.81)
RDW: 15 % (ref 11.5–15.5)
WBC: 10 10*3/uL (ref 4.0–10.5)

## 2017-10-11 LAB — CULTURE, BLOOD (ROUTINE X 2)
Culture: NO GROWTH
Culture: NO GROWTH
SPECIAL REQUESTS: ADEQUATE

## 2017-10-11 LAB — HEPATITIS B SURFACE ANTIGEN: Hepatitis B Surface Ag: NEGATIVE

## 2017-10-11 LAB — CREATININE, FLUID (PLEURAL, PERITONEAL, JP DRAINAGE): Creat, Fluid: 12.3 mg/dL

## 2017-10-11 MED ORDER — SODIUM CHLORIDE 0.9% FLUSH
10.0000 mL | INTRAVENOUS | Status: DC | PRN
  Administered 2017-10-12 – 2017-10-14 (×3): 10 mL
  Filled 2017-10-11 (×3): qty 40

## 2017-10-11 MED ORDER — DEXTROSE 5 % IV SOLN
2.0000 g | INTRAVENOUS | Status: DC
  Administered 2017-10-11 – 2017-10-14 (×4): 2 g via INTRAVENOUS
  Filled 2017-10-11 (×4): qty 2

## 2017-10-11 MED ORDER — SODIUM CHLORIDE 0.9 % IV SOLN
100.0000 mg | INTRAVENOUS | Status: DC
  Administered 2017-10-11 – 2017-10-14 (×4): 100 mg via INTRAVENOUS
  Filled 2017-10-11 (×4): qty 100

## 2017-10-11 MED ORDER — POTASSIUM CHLORIDE CRYS ER 20 MEQ PO TBCR
40.0000 meq | EXTENDED_RELEASE_TABLET | ORAL | Status: AC
  Administered 2017-10-11 (×2): 40 meq via ORAL
  Filled 2017-10-11 (×2): qty 2

## 2017-10-11 NOTE — Progress Notes (Signed)
Peripherally Inserted Central Catheter/Midline Placement  The IV Nurse has discussed with the patient and/or persons authorized to consent for the patient, the purpose of this procedure and the potential benefits and risks involved with this procedure.  The benefits include less needle sticks, lab draws from the catheter, and the patient may be discharged home with the catheter. Risks include, but not limited to, infection, bleeding, blood clot (thrombus formation), and puncture of an artery; nerve damage and irregular heartbeat and possibility to perform a PICC exchange if needed/ordered by physician.  Alternatives to this procedure were also discussed.  Bard Power PICC patient education guide, fact sheet on infection prevention and patient information card has been provided to patient /or left at bedside.    PICC/Midline Placement Documentation  PICC Single Lumen 05/39/76 PICC Right Basilic 39 cm 0 cm (Active)  Indication for Insertion or Continuance of Line Home intravenous therapies (PICC only) 10/11/2017  5:44 PM  Exposed Catheter (cm) 0 cm 10/11/2017  5:44 PM  Site Assessment Clean;Dry;Intact 10/11/2017  5:44 PM  Line Status Flushed;Saline locked;Blood return noted 10/11/2017  5:44 PM  Dressing Type Transparent;Securing device 10/11/2017  5:44 PM  Dressing Status Clean;Dry;Intact;Antimicrobial disc in place 10/11/2017  5:44 PM  Dressing Change Due 10/18/17 10/11/2017  5:44 PM       Kristopher Thompson 10/11/2017, 5:48 PM

## 2017-10-11 NOTE — Progress Notes (Signed)
Nurse provided patient and wife with education regarding how to empty nephrostomy and abdominal drain and how to record output. Patient and wife demonstrated understanding. All questions answered.

## 2017-10-11 NOTE — Progress Notes (Signed)
[facility]  [facility] will be providing Home Infusion Pharmacy services for Dr. Meredith Staggers in parthership with [facility]. I have been in contact with Sharmon [place], RN with [facility] and they are able to support a [facility] visit and resume services on Monday.  [facility] is prepared for DC home on Sunday after pt receives doses of both IV ABX for [facility] on Monday afternoon.  NOTE:  [facility] will need Eraxis changed to Cancidas at DC due to [facility] formulary. If patient discharges after hours, please call [phone].   Larry Sierras 10/11/2017, 3:30 PM

## 2017-10-11 NOTE — Care Management Important Message (Signed)
Important Message  Patient Details  Name: Kristopher Thompson, Kristopher Thompson MRN: [id number] Date of Birth: 1951-01-24   Medicare Important Message Given:  Yes    Kerin Salen 10/11/2017, 10:50 AMImportant Message  Patient Details  Name: Kristopher Thompson, Kristopher Thompson MRN: [id number] Date of Birth: Jan 15, 1951   Medicare Important Message Given:  Yes    Kerin Salen 10/11/2017, 10:49 AM

## 2017-10-11 NOTE — Progress Notes (Signed)
Physical Therapy Treatment Patient Details Name: Kristopher Thompson, DDS MRN: [id number] DOB: 07-Jul-1951 Today's Date: 10/11/2017    History of Present Illness 66 y.o. male,.. With history of prostate cancer, bladder cancer status post radical cystectomy and ileal conduit urinary diversion on 02/02/4009, complicated by rectal injury which was repaired by general surgery status post diverting colostomy came to hospital with complaints of abdominal pain for past 2 days. 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 Urology and surgery consulted and on board. He also underwent PCN on the left by IR on 11/26.    PT Comments    Pt ambulated in hallway and performing well with RW.  Pt encouraged to ambulate with staff and/or family a few times a day.  Pt very close to meeting goals so will likely d/c from acute PT next visit, if pt does not d/c home this weekend.   Follow Up Recommendations  No PT follow up     Equipment Recommendations  Rolling walker with 5" wheels    Recommendations for Other Services       Precautions / Restrictions Precautions Precautions: None Precaution Comments: multiple drains, ostomy    Mobility  Bed Mobility Overal bed mobility: Modified Independent Bed Mobility: Rolling;Sidelying to Sit              Transfers Overall transfer level: Needs assistance Equipment used: Rolling walker (2 wheeled) Transfers: Sit to/from Stand Sit to Stand: Supervision            Ambulation/Gait Ambulation/Gait assistance: Supervision Ambulation Distance (Feet): 400 Feet Assistive device: Rolling walker (2 wheeled) Gait Pattern/deviations: Step-through pattern     General Gait Details: steady, denies any symptoms   Stairs            Wheelchair Mobility    Modified Rankin (Stroke Patients Only)       Balance                                            Cognition Arousal/Alertness: Awake/alert Behavior  During Therapy: WFL for tasks assessed/performed Overall Cognitive Status: Within Functional Limits for tasks assessed                                        Exercises      General Comments        Pertinent Vitals/Pain Pain Assessment: No/denies pain    Home Living                      Prior Function            PT Goals (current goals can now be found in the care plan section) Progress towards PT goals: Progressing toward goals    Frequency    Min 3X/week      PT Plan Current plan remains appropriate    Co-evaluation              AM-PAC PT "6 Clicks" Daily Activity  Outcome Measure  Difficulty turning over in bed (including adjusting bedclothes, sheets and blankets)?: None Difficulty moving from lying on back to sitting on the side of the bed? : A Little Difficulty sitting down on and standing up from a chair with arms (e.g., wheelchair, bedside commode, etc,.)?: A Little Help needed  moving to and from a bed to chair (including a wheelchair)?: A Little Help needed walking in hospital room?: A Little Help needed climbing 3-5 steps with a railing? : A Little 6 Click Score: 19    End of Session   Activity Tolerance: Patient tolerated treatment well Patient left: with call bell/phone within reach;in chair Nurse Communication: Mobility status PT Visit Diagnosis: Difficulty in walking, not elsewhere classified (R26.2)     Time: 0813-8871 PT Time Calculation (min) (ACUTE ONLY): 16 min  Charges:  $Gait Training: 8-22 mins                    G Codes:       Carmelia Bake, PT, DPT 10/11/2017 Pager: [phone]  York Ram E 10/11/2017, 1:19 PM

## 2017-10-11 NOTE — Progress Notes (Signed)
PROGRESS NOTE    Kristopher Thompson, Kristopher Thompson  UUV:[id number] DOB: 12-20-1950 DOA: 10/04/2017 PCP: Lorene Dy, MD    Brief Narrative:  Kristopher Thompson a66 y.o.male,..With history of prostate cancer, bladder cancer status post radical cystectomy and ileal conduit urinary diversion on 4/74/2595, complicated by rectal injury which was repaired by general surgery status post diverting colostomy came to hospital with complaints of abdominal pain for past 2 days.Pt underwentCT guided percutaneous catheter drainage of urinoma. Urology and surgery consulted and on board. He also underwent PCN on the left by IR on 11/26. Pt continues to have fevers, on broad spectrum antibiotics.  ID was consulted and antifungal regimen added with fever curve trending down.      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 Status post ureteral stent removal 1120, status post percutaneous catheter drainage of urinoma 1123 with continued clear urine. Patient was pancultured blood cultures from 10/04/2017 positive for Klebsiella pneumonia. Repeat blood cultures from 10/06/2017 with no growth to date. Anaerobic and aerobic cultures with abundant WBCs, multiple organisms present none predominant no anaerobes isolated. Body fluid culture from  10/08/2017 with moderate WBC, few yeast with pseudohyphae, moderate Candida albicans, few Klebsiella pneumonia. Patient fever curve trending down after the addition of antifungal.  Urine cultures from 10/08/2017 with 50,000 colonies of Candida albicans.  Continue empiric IV Eraxis.  Change IV Zosyn to IV Rocephin and discussions with ID as patient unable to be placed on Azole antifungal agents due to his QTC prolongation.  Patient will be treated with IV antifungal and IV antibiotics through December 12.  Place PICC line.  ID following and I appreciate their input and recommendations.    #2. 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  Repeat blood cultures from 10/06/2017 still with no growth to date.  Sensitivities have resulted and resistant to ampicillin and ampicillin/sulbactam however sensitive to Zosyn.  IV vancomycin was discontinued 10/05/2017.  IV Zosyn has been discontinued and changed to IV Rocephin after discussions with ID.  Patient will also be continued on IV eraxis due to patient's history of QT prolongation.  Patient will need IV antibiotics through December 12 as recommended per ID.  Will order PICC line to be placed.  ID following and I appreciate your input and recommendations.    #3 hypokalemia/hypophosphatemia Replete.  #4 anemia of chronic disease H&H stable.  #5 delayed urine leak status post radical cystectomy and ileal conduit 08/08/2017 Patient being followed by urology who recommended continuing abdominal drain and nephrostomy drainage with monitoring of outputs. Per urology.  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  Replete electrolytes.  Follow.   DVT prophylaxis: Lovenox Code Status: Full Family Communication: Updated patient.  No family at bedside.   Disposition Plan: Likely home with home health once  patient is afebrile for 48 hours  with clinical improvement.   Consultants:   Urology: Dr. Junious Silk 10/04/2017  Interventional radiologist: Dr. Kathlene Cote 10/04/2017  ID Dr. Tommy Medal 10/09/2017  Procedures:   CT abdomen and pelvis 10/06/2017, 10/04/2017  Chest x-ray 10/09/2017  CT-guided percutaneous catheter drainage of urinoma anterior to ileal conduit per Dr. Kathlene Cote 10/04/2017  Ultrasound and fluoroscopy scopic guided placement of left sided 51 French or cutaneous nephrostomy, antegrade nephrostogram demonstrates a distal left ureteral injury with extravasation of contrast communicating with percutaneous drainage catheter per Dr. Pascal Lux 10/07/2017   Antimicrobials:   IV Eraxis 10/09/2017  IV vancomycin 10/04/2017>>>>10/05/2017  IV Zosyn 10/04/2017>>>> 10/11/2017  IV Rocephin 10/11/2017   Subjective: Patient states feels better this morning.  No chest pain.  No shortness of breath.  Fever curve has trended down.   Objective: Vitals:   10/10/17 1500 10/10/17 2159 10/11/17 0525 10/11/17 1406  BP:  (!) 142/81 135/73 (!) 143/72  Pulse:  78 70 68  Resp:  18 19 16   Temp:  99.9 F (37.7 C) 99.4 F (37.4 C) (!) 97.5 F (36.4 C)  TempSrc:  Oral Oral Axillary  SpO2:  97% 97% 96%  Weight: 120 kg (264 lb 8 oz)     Height:        Intake/Output Summary (Last 24 hours) at 10/11/2017 1456 Last data filed at 10/11/2017 1415 Gross per 24 hour  Intake 2610.83 ml  Output 4448 ml  Net -1837.17 ml   Filed Weights   10/04/17 0111 10/10/17 1500  Weight: 114.7 kg (252 lb 13.9 oz) 120 kg (264 lb 8 oz)    Examination:  General exam: Appears calm and comfortable.NAD. Respiratory system: Lungs clear to auscultation anterior lung fields.  No wheezing.  No rhonchi. Respiratory effort normal. Cardiovascular system: S1 & S2 heard, RRR. No JVD, murmurs, rubs, gallops or clicks. No pedal edema. Gastrointestinal system: Abdomen is soft, nontender, nondistended. No organomegaly or masses felt. Colostomy bag with  stool. Nephrostomy bag draining. Urostomy bag draining. Normal bowel sounds heard. Central nervous system: Alert and oriented. No focal neurological deficits. Extremities: Symmetric 5 x 5 power. Skin: No rashes, lesions or ulcers Psychiatry: Judgement and insight appear normal. Mood & affect appropriate.     Data Reviewed: I have personally reviewed following labs and imaging studies  CBC: Recent Labs  Lab 10/07/17 0456 10/08/17 0510 10/09/17 0506 10/10/17 0518 10/11/17 0532  WBC 14.9* 11.8* 9.1 10.5 10.0  NEUTROABS  --   --   --   --  7.5  HGB 10.3* 9.9* 9.6* 9.8* 9.4*  HCT 32.2* 30.3* 29.8* 30.0* 29.4*  MCV 78.3 77.3* 77.4* 77.1* 76.8*  PLT 312 273 229 236 147   Basic Metabolic Panel: Recent Labs  Lab 10/06/17 1333  10/08/17 1943 10/09/17 0506 10/09/17 1647 10/10/17 0518 10/11/17 0532  NA 137   < > 140 137 137 140 141  K 3.1*   < > 2.8* 2.8* 2.8* 3.4* 3.0*  CL 107   < > 109 110 109 112* 112*  CO2 23   < > 24 22 23 23 24   GLUCOSE 99   < > 101* 93 109* 138* 103*  BUN 17   < > 13 12 13 11 9   CREATININE 0.90   < > 0.90 0.87 0.81 0.93 0.75  CALCIUM 7.9*   < > 7.4* 7.2* 7.3* 7.5* 7.4*  MG 2.0  --   --  1.8  --  2.3  --   PHOS  1.7*  --   --   --   --   --   --    < > = values in this interval not displayed.   GFR: Estimated Creatinine Clearance: 123.2 mL/min (by C-G formula based on SCr of 0.75 mg/dL). Liver Function Tests: Recent Labs  Lab 10/05/17 0602 10/07/17 0456  AST 16 14*  ALT 21 15*  ALKPHOS 61 64  BILITOT 0.8 0.6  PROT 5.6* 5.9*  ALBUMIN 2.1* 2.3*   No results for input(s): LIPASE, AMYLASE in the last 168 hours. No results for input(s): AMMONIA in the last 168 hours. Coagulation Profile: Recent Labs  Lab 10/07/17 0947  INR 1.18   Cardiac Enzymes: No results for input(s): CKTOTAL, CKMB, CKMBINDEX, TROPONINI in the last 168 hours. BNP (last 3 results) No results for input(s): PROBNP in the last 8760 hours. HbA1C: No results for input(s):  HGBA1C in the last 72 hours. CBG: No results for input(s): GLUCAP in the last 168 hours. Lipid Profile: No results for input(s): CHOL, HDL, LDLCALC, TRIG, CHOLHDL, LDLDIRECT in the last 72 hours. Thyroid Function Tests: No results for input(s): TSH, T4TOTAL, FREET4, T3FREE, THYROIDAB in the last 72 hours. Anemia Panel: No results for input(s): VITAMINB12, FOLATE, FERRITIN, TIBC, IRON, RETICCTPCT in the last 72 hours. Sepsis Labs: Recent Labs  Lab 10/05/17 0602 10/06/17 0431  PROCALCITON 5.36 3.76  LATICACIDVEN 1.3  --     Recent Results (from the past 240 hour(s))  Culture, blood (Routine X 2) w Reflex to ID Panel     Status: Abnormal   Collection Time: 10/04/17  5:33 AM  Result Value Ref Range Status   Specimen Description BLOOD LEFT ANTECUBITAL  Final   Special Requests   Final    BOTTLES DRAWN AEROBIC AND ANAEROBIC Blood Culture adequate volume   Culture  Setup Time   Final    GRAM NEGATIVE RODS AEROBIC BOTTLE ONLY CRITICAL RESULT CALLED TO, READ BACK BY AND VERIFIED WITH: J. GRIMSLEY, RPHARMD (WL) AT 0308 ON 10/05/17 BY C. JESSUP, MLT. Performed at [facility], [place] [address] [place], [place] [address]    Culture KLEBSIELLA PNEUMONIAE (A)  Final   Report Status 10/07/2017 FINAL  Final   Organism ID, Bacteria KLEBSIELLA PNEUMONIAE  Final      Susceptibility   Klebsiella pneumoniae - MIC*    AMPICILLIN >=32 RESISTANT Resistant     CEFAZOLIN <=4 SENSITIVE Sensitive     CEFEPIME <=1 SENSITIVE Sensitive     CEFTAZIDIME <=1 SENSITIVE Sensitive     CEFTRIAXONE <=1 SENSITIVE Sensitive     CIPROFLOXACIN <=0.25 SENSITIVE Sensitive     GENTAMICIN <=1 SENSITIVE Sensitive     IMIPENEM <=0.25 SENSITIVE Sensitive     TRIMETH/SULFA <=20 SENSITIVE Sensitive     AMPICILLIN/SULBACTAM >=32 RESISTANT Resistant     PIP/TAZO 16 SENSITIVE Sensitive     Extended ESBL NEGATIVE Sensitive     * KLEBSIELLA PNEUMONIAE  Blood Culture ID Panel (Reflexed)     Status: Abnormal    Collection Time: 10/04/17  5:33 AM  Result Value Ref Range Status   Enterococcus species NOT DETECTED NOT DETECTED Final   Listeria monocytogenes NOT DETECTED NOT DETECTED Final   Staphylococcus species NOT DETECTED NOT DETECTED Final   Staphylococcus aureus NOT DETECTED NOT DETECTED Final   Streptococcus species NOT DETECTED NOT DETECTED Final   Streptococcus agalactiae NOT DETECTED NOT DETECTED Final   Streptococcus pneumoniae NOT DETECTED NOT DETECTED Final   Streptococcus pyogenes NOT DETECTED NOT  DETECTED Final   Acinetobacter baumannii NOT DETECTED NOT DETECTED Final   Enterobacteriaceae species DETECTED (A) NOT DETECTED Final    Comment: Enterobacteriaceae represent a large family of gram-negative bacteria, not a single organism. CRITICAL RESULT CALLED TO, READ BACK BY AND VERIFIED WITH: J. GRIMSLEY, RPHARMD ([place]) AT 0308 ON 10/05/17 BY C. JESSUP, MLT.    Enterobacter cloacae complex NOT DETECTED NOT DETECTED Final   Escherichia coli NOT DETECTED NOT DETECTED Final   Klebsiella oxytoca NOT DETECTED NOT DETECTED Final   Klebsiella pneumoniae DETECTED (A) NOT DETECTED Final    Comment: CRITICAL RESULT CALLED TO, READ BACK BY AND VERIFIED WITH: J. GRIMSLEY, RPHARMD (WL) AT 0308 ON 10/05/17 BY C. JESSUP, MLT.    Proteus species NOT DETECTED NOT DETECTED Final   Serratia marcescens NOT DETECTED NOT DETECTED Final   Carbapenem resistance NOT DETECTED NOT DETECTED Final   Haemophilus influenzae NOT DETECTED NOT DETECTED Final   Neisseria meningitidis NOT DETECTED NOT DETECTED Final   Pseudomonas aeruginosa NOT DETECTED NOT DETECTED Final   Candida albicans NOT DETECTED NOT DETECTED Final   Candida glabrata NOT DETECTED NOT DETECTED Final   Candida krusei NOT DETECTED NOT DETECTED Final   Candida parapsilosis NOT DETECTED NOT DETECTED Final   Candida tropicalis NOT DETECTED NOT DETECTED Final    Comment: Performed at [facility], [place] [address] [place], [place] [address]    Culture, blood (Routine X 2) w Reflex to ID Panel     Status: None   Collection Time: 10/04/17  5:35 AM  Result Value Ref Range Status   Specimen Description BLOOD RIGHT ANTECUBITAL  Final   Special Requests   Final    BOTTLES DRAWN AEROBIC AND ANAEROBIC Blood Culture adequate volume   Culture   Final    NO GROWTH 5 DAYS Performed at [facility], [place] [address], [place], [place] [address]    Report Status 10/09/2017 FINAL  Final  Culture, Urine     Status: Abnormal   Collection Time: 10/04/17  5:47 AM  Result Value Ref Range Status   Specimen Description URINE, RANDOM  Final   Special Requests NONE  Final   Culture MULTIPLE SPECIES PRESENT, SUGGEST RECOLLECTION (A)  Final   Report Status 10/05/2017 FINAL  Final  Aerobic/Anaerobic Culture (surgical/deep wound)     Status: Abnormal   Collection Time: 10/04/17 12:19 PM  Result Value Ref Range Status   Specimen Description ABSCESS  Final   Special Requests Normal  Final   Gram Stain   Final    ABUNDANT WBC PRESENT,BOTH PMN AND MONONUCLEAR ABUNDANT GRAM VARIABLE ROD FEW GRAM POSITIVE COCCI IN PAIRS IN CHAINS    Culture (A)  Final    MULTIPLE ORGANISMS PRESENT, NONE PREDOMINANT NO ANAEROBES ISOLATED Performed at [facility], [place] [address] [place], [place] [address]    Report Status 10/09/2017 FINAL  Final  Culture, blood (Routine X 2) w Reflex to ID Panel     Status: None   Collection Time: 10/06/17  9:42 AM  Result Value Ref Range Status   Specimen Description BLOOD RIGHT ANTECUBITAL  Final   Special Requests   Final    BOTTLES DRAWN AEROBIC AND ANAEROBIC Blood Culture results may not be optimal due to an excessive volume of blood received in culture bottles   Culture   Final    NO GROWTH 5 DAYS Performed at [facility], [place] [address], [place], [place] [address]  Report Status 10/11/2017 FINAL  Final  Culture, blood (Routine X 2) w Reflex to ID Panel     Status: None   Collection Time: 10/06/17   9:50 AM  Result Value Ref Range Status   Specimen Description BLOOD LEFT ANTECUBITAL  Final   Special Requests IN PEDIATRIC BOTTLE Blood Culture adequate volume  Final   Culture   Final    NO GROWTH 5 DAYS Performed at [facility], [place] [address], [place], [place] [address]    Report Status 10/11/2017 FINAL  Final  Urine Culture     Status: Abnormal   Collection Time: 10/08/17  4:32 PM  Result Value Ref Range Status   Specimen Description KIDNEY LEFT  Final   Special Requests Normal  Final   Culture 50,000 COLONIES/mL CANDIDA ALBICANS (A)  Final   Report Status 10/10/2017 FINAL  Final  Body fluid culture     Status: None (Preliminary result)   Collection Time: 10/08/17  6:16 PM  Result Value Ref Range Status   Specimen Description ABDOMEN  Final   Special Requests Normal  Final   Gram Stain   Final    MODERATE WBC PRESENT, PREDOMINANTLY PMN FEW YEAST WITH PSEUDOHYPHAE    Culture   Final    MODERATE CANDIDA ALBICANS FEW KLEBSIELLA PNEUMONIAE SUSCEPTIBILITIES TO FOLLOW Performed at [facility], [place] [address], [place], [place] [address]    Report Status PENDING  Incomplete         Radiology Studies: No results found.      Scheduled Meds: . enoxaparin (LOVENOX) injection  40 mg Subcutaneous Q24H  . pantoprazole  40 mg Oral Daily  . polyethylene glycol  17 g Oral Daily  . protein supplement shake  11 oz Oral BID BM  . senna-docusate  2 tablet Oral BID  . sodium chloride flush  5 mL Other Q8H   Continuous Infusions: . anidulafungin 100 mg (10/11/17 1410)  . cefTRIAXone (ROCEPHIN)  IV Stopped (10/11/17 1445)     LOS: 7 days    Time spent: 40 minutes    Irine Seal, MD [facility] Pager [phone]  If 7PM-7AM, please contact night-coverage [email or web address] Password [email or web address] 10/11/2017, 2:56 PM

## 2017-10-11 NOTE — Progress Notes (Signed)
Patient ID: [name], DDS, male   DOB: 1951-01-06, 66 y.o.   MRN: [id number]     Subjective: Pt feeling much better over last 24 hours.  Nausea improved.  Tolerating some po intake now.  Still having BMs.  No fever last 24 hrs.  On fluconazole and Zosyn per ID.  Objective: Vital signs in last 24 hours: Temp:  [99.4 F (37.4 C)-99.9 F (37.7 C)] 99.4 F (37.4 C) (11/30 0525) Pulse Rate:  [70-78] 70 (11/30 0525) Resp:  [18-19] 19 (11/30 0525) BP: (135-149)/(69-81) 135/73 (11/30 0525) SpO2:  [96 %-97 %] 97 % (11/30 0525) Weight:  [120 kg (264 lb 8 oz)] 120 kg (264 lb 8 oz) (11/29 1500)  Intake/Output from previous day: 11/29 0701 - 11/30 0700 In: 2975.8 [P.O.:720; I.V.:1975.8; IV Piggyback:280] Out: 3354 [Urine:4930; Drains:143; Stool:200]   L PCN - 2605, Urostomy - 2325 Intake/Output this shift: No intake/output data recorded.  Physical Exam:  General: Alert and oriented CV: RRR Lungs: Clear Abdomen: Soft, ND, Urostomy pink and draining well, Colostomy with stool in bag, Abd drain in place with minimal output, L PCN draining grossly clear urine. Ext: NT, No erythema  Lab Results: Recent Labs    10/09/17 0506 10/10/17 0518 10/11/17 0532  HGB 9.6* 9.8* 9.4*  HCT 29.8* 30.0* 29.4*   BMET Recent Labs    10/10/17 0518 10/11/17 0532  NA 140 141  K 3.4* 3.0*  CL 112* 112*  CO2 23 24  GLUCOSE 138* 103*  BUN 11 9  CREATININE 0.93 0.75  CALCIUM 7.5* 7.4*     Studies/Results:   Assessment/Plan: 1) Urine leak: Improved with decreased output from drain.  Will repeat Cr level on drain to assess.  I would favor leaving abdominal drain in place upon discharge along with left PCN to optimize drainage with plans for repeat nephrostogram and possible nephroureteral stent to be placed by IR in about 10-14 days.  I will discuss this plan with IR to schedule as outpatient.  2) GI:  Appetite gradually improving.  Still need to see better po intake before discharge and  have encourage patient.  Continue diet supplements as needed.  3) PT: He did fairly well yesterday with PT eval.    4) ID: On fluconazole for Candida and Zosyn for Klebsiella.  Will defer to Dr. Tommy Medal for recs regarding antibiotic therapy, duration of therapy, transition to oral therapy, etc prior to discharge.   LOS: 7 days   Tadao Emig,LES 10/11/2017, 8:01 AM

## 2017-10-11 NOTE — Progress Notes (Addendum)
Subjective: No new complaints   Antibiotics:  Anti-infectives (From admission, onward)   Start     Dose/Rate Route Frequency Ordered Stop   10/11/17 1000  fluconazole (DIFLUCAN) IVPB 400 mg     400 mg 100 mL/hr over 120 Minutes Intravenous Every 24 hours 10/10/17 2200     10/10/17 1000  anidulafungin (ERAXIS) 100 mg in sodium chloride 0.9 % 100 mL IVPB  Status:  Discontinued     100 mg 78 mL/hr over 100 Minutes Intravenous Every 24 hours 10/09/17 1122 10/10/17 1658   10/09/17 1200  anidulafungin (ERAXIS) 200 mg in sodium chloride 0.9 % 200 mL IVPB     200 mg 78 mL/hr over 200 Minutes Intravenous  Once 10/09/17 1122 10/09/17 1640   10/05/17 1700  vancomycin (VANCOCIN) IVPB 1000 mg/200 mL premix  Status:  Discontinued     1,000 mg 200 mL/hr over 60 Minutes Intravenous Every 12 hours 10/05/17 1002 10/07/17 0915   10/04/17 0600  vancomycin (VANCOCIN) 1,750 mg in sodium chloride 0.9 % 500 mL IVPB  Status:  Discontinued     1,750 mg 250 mL/hr over 120 Minutes Intravenous Every 24 hours 10/04/17 0325 10/04/17 0445   10/04/17 0600  vancomycin (VANCOCIN) 1,250 mg in sodium chloride 0.9 % 250 mL IVPB  Status:  Discontinued     1,250 mg 166.7 mL/hr over 90 Minutes Intravenous Every 24 hours 10/04/17 0446 10/05/17 1002   10/04/17 0330  piperacillin-tazobactam (ZOSYN) IVPB 3.375 g     3.375 g 12.5 mL/hr over 240 Minutes Intravenous Every 8 hours 10/04/17 0322     10/04/17 0300  vancomycin (VANCOCIN) 1,500 mg in sodium chloride 0.9 % 500 mL IVPB  Status:  Discontinued     1,500 mg 250 mL/hr over 120 Minutes Intravenous  Once 10/04/17 0252 10/04/17 0321   10/04/17 0300  piperacillin-tazobactam (ZOSYN) IVPB 3.375 g  Status:  Discontinued     3.375 g 100 mL/hr over 30 Minutes Intravenous  Once 10/04/17 0252 10/04/17 0320      Medications: Scheduled Meds: . enoxaparin (LOVENOX) injection  40 mg Subcutaneous Q24H  . pantoprazole  40 mg Oral Daily  . polyethylene glycol  17 g Oral  Daily  . potassium chloride  40 mEq Oral Q4H  . protein supplement shake  11 oz Oral BID BM  . senna-docusate  2 tablet Oral BID  . sodium chloride flush  5 mL Other Q8H   Continuous Infusions: . fluconazole (DIFLUCAN) IV Stopped (10/11/17 1000)  . piperacillin-tazobactam (ZOSYN)  IV Stopped (10/11/17 0927)   PRN Meds:.acetaminophen, HYDROmorphone (DILAUDID) injection, iopamidol, lip balm, metoCLOPramide (REGLAN) injection, ondansetron **OR** ondansetron (ZOFRAN) IV, oxyCODONE-acetaminophen, phenol, promethazine    Objective: Weight change:   Intake/Output Summary (Last 24 hours) at 10/11/2017 1050 Last data filed at 10/11/2017 1000 Gross per 24 hour  Intake 3345.83 ml  Output 4473 ml  Net -1127.17 ml   Blood pressure 135/73, pulse 70, temperature 99.4 F (37.4 C), temperature source Oral, resp. rate 19, height 6' (1.829 m), weight 264 lb 8 oz (120 kg), SpO2 97 %. Temp:  [99.4 F (37.4 C)-99.9 F (37.7 C)] 99.4 F (37.4 C) (11/30 0525) Pulse Rate:  [70-78] 70 (11/30 0525) Resp:  [18-19] 19 (11/30 0525) BP: (135-142)/(73-81) 135/73 (11/30 0525) SpO2:  [97 %] 97 % (11/30 0525) Weight:  [264 lb 8 oz (120 kg)] 264 lb 8 oz (120 kg) (11/29 1500)  Physical Exam: General: Alert and awake, oriented x3  HEENT: anicteric sclera, EOMI CVS regular rate, normal r,  Chest:  no wheezing, or resp distress Abdomen: soft , nondistended,ostomy clean, ileiostomy clean, drain right side with urine, PCN on the left with urine Extremities: no  clubbing or edema noted bilaterally Skin: no rashes Neuro: nonfocal  CBC:  CBC Latest Ref Rng & Units 10/11/2017 10/10/2017 10/09/2017  WBC 4.0 - 10.5 K/uL 10.0 10.5 9.1  Hemoglobin 13.0 - 17.0 g/dL 9.4(L) 9.8(L) 9.6(L)  Hematocrit 39.0 - 52.0 % 29.4(L) 30.0(L) 29.8(L)  Platelets 150 - 400 K/uL 235 236 229      BMET Recent Labs    10/10/17 0518 10/11/17 0532  NA 140 141  K 3.4* 3.0*  CL 112* 112*  CO2 23 24  GLUCOSE 138* 103*  BUN 11  9  CREATININE 0.93 0.75  CALCIUM 7.5* 7.4*     Liver Panel  No results for input(s): PROT, ALBUMIN, AST, ALT, ALKPHOS, BILITOT, BILIDIR, IBILI in the last 72 hours.     Sedimentation Rate No results for input(s): ESRSEDRATE in the last 72 hours. C-Reactive Protein No results for input(s): CRP in the last 72 hours.  Micro Results: Recent Results (from the past 720 hour(s))  Culture, blood (Routine X 2) w Reflex to ID Panel     Status: Abnormal   Collection Time: 10/04/17  5:33 AM  Result Value Ref Range Status   Specimen Description BLOOD LEFT ANTECUBITAL  Final   Special Requests   Final    BOTTLES DRAWN AEROBIC AND ANAEROBIC Blood Culture adequate volume   Culture  Setup Time   Final    GRAM NEGATIVE RODS AEROBIC BOTTLE ONLY CRITICAL RESULT CALLED TO, READ BACK BY AND VERIFIED WITH: J. GRIMSLEY, RPHARMD (WL) AT 0308 ON 10/05/17 BY C. JESSUP, MLT. Performed at [facility], [place] [address], [place], [place] [address]    Culture KLEBSIELLA PNEUMONIAE (A)  Final   Report Status 10/07/2017 FINAL  Final   Organism ID, Bacteria KLEBSIELLA PNEUMONIAE  Final      Susceptibility   Klebsiella pneumoniae - MIC*    AMPICILLIN >=32 RESISTANT Resistant     CEFAZOLIN <=4 SENSITIVE Sensitive     CEFEPIME <=1 SENSITIVE Sensitive     CEFTAZIDIME <=1 SENSITIVE Sensitive     CEFTRIAXONE <=1 SENSITIVE Sensitive     CIPROFLOXACIN <=0.25 SENSITIVE Sensitive     GENTAMICIN <=1 SENSITIVE Sensitive     IMIPENEM <=0.25 SENSITIVE Sensitive     TRIMETH/SULFA <=20 SENSITIVE Sensitive     AMPICILLIN/SULBACTAM >=32 RESISTANT Resistant     PIP/TAZO 16 SENSITIVE Sensitive     Extended ESBL NEGATIVE Sensitive     * KLEBSIELLA PNEUMONIAE  Blood Culture ID Panel (Reflexed)     Status: Abnormal   Collection Time: 10/04/17  5:33 AM  Result Value Ref Range Status   Enterococcus species NOT DETECTED NOT DETECTED Final   Listeria monocytogenes NOT DETECTED NOT DETECTED Final   Staphylococcus  species NOT DETECTED NOT DETECTED Final   Staphylococcus aureus NOT DETECTED NOT DETECTED Final   Streptococcus species NOT DETECTED NOT DETECTED Final   Streptococcus agalactiae NOT DETECTED NOT DETECTED Final   Streptococcus pneumoniae NOT DETECTED NOT DETECTED Final   Streptococcus pyogenes NOT DETECTED NOT DETECTED Final   Acinetobacter baumannii NOT DETECTED NOT DETECTED Final   Enterobacteriaceae species DETECTED (A) NOT DETECTED Final    Comment: Enterobacteriaceae represent a large family of gram-negative bacteria, not a single organism. CRITICAL RESULT CALLED TO, READ BACK BY AND VERIFIED  WITH: Lavell Luster, RPHARMD (WL) AT 0308 ON 10/05/17 BY C. JESSUP, MLT.    Enterobacter cloacae complex NOT DETECTED NOT DETECTED Final   Escherichia coli NOT DETECTED NOT DETECTED Final   Klebsiella oxytoca NOT DETECTED NOT DETECTED Final   Klebsiella pneumoniae DETECTED (A) NOT DETECTED Final    Comment: CRITICAL RESULT CALLED TO, READ BACK BY AND VERIFIED WITH: J. GRIMSLEY, RPHARMD (WL) AT 0308 ON 10/05/17 BY C. JESSUP, MLT.    Proteus species NOT DETECTED NOT DETECTED Final   Serratia marcescens NOT DETECTED NOT DETECTED Final   Carbapenem resistance NOT DETECTED NOT DETECTED Final   Haemophilus influenzae NOT DETECTED NOT DETECTED Final   Neisseria meningitidis NOT DETECTED NOT DETECTED Final   Pseudomonas aeruginosa NOT DETECTED NOT DETECTED Final   Candida albicans NOT DETECTED NOT DETECTED Final   Candida glabrata NOT DETECTED NOT DETECTED Final   Candida krusei NOT DETECTED NOT DETECTED Final   Candida parapsilosis NOT DETECTED NOT DETECTED Final   Candida tropicalis NOT DETECTED NOT DETECTED Final    Comment: Performed at [facility], [place] [address], [place], [place] [address]  Culture, blood (Routine X 2) w Reflex to ID Panel     Status: None   Collection Time: 10/04/17  5:35 AM  Result Value Ref Range Status   Specimen Description BLOOD RIGHT ANTECUBITAL  Final   Special  Requests   Final    BOTTLES DRAWN AEROBIC AND ANAEROBIC Blood Culture adequate volume   Culture   Final    NO GROWTH 5 DAYS Performed at [facility], [place] [address], [place], [place] [address]    Report Status 10/09/2017 FINAL  Final  Culture, Urine     Status: Abnormal   Collection Time: 10/04/17  5:47 AM  Result Value Ref Range Status   Specimen Description URINE, RANDOM  Final   Special Requests NONE  Final   Culture MULTIPLE SPECIES PRESENT, SUGGEST RECOLLECTION (A)  Final   Report Status 10/05/2017 FINAL  Final  Aerobic/Anaerobic Culture (surgical/deep wound)     Status: Abnormal   Collection Time: 10/04/17 12:19 PM  Result Value Ref Range Status   Specimen Description ABSCESS  Final   Special Requests Normal  Final   Gram Stain   Final    ABUNDANT WBC PRESENT,BOTH PMN AND MONONUCLEAR ABUNDANT GRAM VARIABLE ROD FEW GRAM POSITIVE COCCI IN PAIRS IN CHAINS    Culture (A)  Final    MULTIPLE ORGANISMS PRESENT, NONE PREDOMINANT NO ANAEROBES ISOLATED Performed at [facility], [place] [address] [place], [place] [address]    Report Status 10/09/2017 FINAL  Final  Culture, blood (Routine X 2) w Reflex to ID Panel     Status: None (Preliminary result)   Collection Time: 10/06/17  9:42 AM  Result Value Ref Range Status   Specimen Description BLOOD RIGHT ANTECUBITAL  Final   Special Requests   Final    BOTTLES DRAWN AEROBIC AND ANAEROBIC Blood Culture results may not be optimal due to an excessive volume of blood received in culture bottles   Culture   Final    NO GROWTH 4 DAYS Performed at [facility], [place] [address] [place], [place] [address]    Report Status PENDING  Incomplete  Culture, blood (Routine X 2) w Reflex to ID Panel     Status: None (Preliminary result)   Collection Time: 10/06/17  9:50 AM  Result Value Ref Range Status   Specimen Description BLOOD LEFT  ANTECUBITAL  Final   Special Requests IN PEDIATRIC BOTTLE Blood Culture adequate volume   Final   Culture   Final    NO GROWTH 4 DAYS Performed at [facility], [place] [address], [place], [place] [address]    Report Status PENDING  Incomplete  Urine Culture     Status: Abnormal   Collection Time: 10/08/17  4:32 PM  Result Value Ref Range Status   Specimen Description KIDNEY LEFT  Final   Special Requests Normal  Final   Culture 50,000 COLONIES/mL CANDIDA ALBICANS (A)  Final   Report Status 10/10/2017 FINAL  Final  Body fluid culture     Status: None (Preliminary result)   Collection Time: 10/08/17  6:16 PM  Result Value Ref Range Status   Specimen Description ABDOMEN  Final   Special Requests Normal  Final   Gram Stain   Final    MODERATE WBC PRESENT, PREDOMINANTLY PMN FEW YEAST WITH PSEUDOHYPHAE    Culture   Final    MODERATE CANDIDA ALBICANS FEW KLEBSIELLA PNEUMONIAE SUSCEPTIBILITIES TO FOLLOW Performed at [facility], [place] [address] [place], [place] [address]    Report Status PENDING  Incomplete    Studies/Results: Dg Chest 2 View  Result Date: 10/09/2017 CLINICAL DATA:  Fever, shortness of breath. EXAM: CHEST  2 VIEW COMPARISON:  Radiographs of September 26, 2012. FINDINGS: The heart size and mediastinal contours are within normal limits. Hypoinflation of the lungs is noted with mild bibasilar subsegmental atelectasis. Minimal bilateral pleural effusions are noted. The visualized skeletal structures are unremarkable. IMPRESSION: Hypoinflation of the lungs with mild bibasilar subsegmental atelectasis and minimal bilateral pleural effusions. Electronically Signed   By: Marijo Conception, M.D.   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 He is sp IR drainge of urinoma and left sided PCN. Initial cultures from urinoma grew mx organisms, repeat cultures are growing candida and klebsiella (though likely taken through line) left kidney culture which was obtained with PCN is also growing candida albicans  #1 Infected urionma/abscess: given C albicans will change to fluconazole and this can eventually be converted to po, will continue this with zosyn for now  When he is ready for DC would change him to   Oral keflex 500 mg po QID and Fluconazole 400mg  po QDaily  until December 12th  #2 Klebsiella PNA bacteremia: repeat blood cultures no growth will easily complete > least 10 days of effective therapy with duration driven instead by treatment of his urinoma  Note the above plan is contingent upon Sensis of his klebsiella from drain (if we are going to target that cutlure) also showing S to these abx  I will followup on that sensi data but otherwise will sign off  Please call with further questions. Patient has my card.   ADDENDUM: PHARMACY AND DR. THOMPSON MADE ME AWARE OF PTS QT BEING >500 AND WE DID NOT WANT TO HAZARD QT PROLONGATION  THEREFORE WE NEEDED TO STAY AWAY FROM [facility] AND CONTINUE ECHINOCANDIN THERAPY FOR HIS INVASIVE CANDIDAL INFECTION  BECAUSE WE NEEDED TO USED IV THERAPY FOR THE CANDIDA I FELT WE SHOULD GO AHEAD AND ALSO USE ONCE DAILY PARENTERAL THERAPY FOR HIS KLEBSIELLA BACTEREMIA AND THE ABSCESS AS WELL AND GIVE  HIM CEFTRIAXONE  Diagnosis: INFECTED URINOMA/ABSCESS + BACTEREMIA  Culture Result:  Klebsiella pNA in blood and urinoma, candida albicans in urinoma  Allergies  Allergen Reactions  . Demerol [Meperidine] Other (See Comments)    SEVERE NAUSEA    OPAT Orders Discharge antibiotics: CASPOFUNGIN 50 MG DAILY AND CEFTRIAXONE  2 GRAMS DAILY  Duration: 2 WEEKS End Date:  December 12th, 2018  [place] Per Protocol:  Labs weekly while on IV antibiotics: _x_ CBC with differential _x_ CMP   _x_ Please pull PIC at completion of IV antibiotics __ Please leave PIC in place until doctor has seen patient or been notified  Fax weekly labs to [phone]  It is FINE TO PLACE PICC LINE NOW.     LOS: 7 days   Alcide Evener 10/11/2017, 10:50 AM

## 2017-10-11 NOTE — Progress Notes (Signed)
Referring Physician(s): Borden,L  Supervising Physician: Aletta Edouard  Patient Status:  [facility] - In-pt  Chief Complaint: Post op urine leak  Subjective: Pt doing ok; still feels a little weak; nausea improved; denies sig abd pain, some left flank soreness   Allergies: Demerol [meperidine]  Medications: Prior to Admission medications   Medication Sig Start Date End Date Taking? Authorizing Provider  acetaminophen (TYLENOL) 500 MG tablet Take 1,000 mg by mouth daily as needed for mild pain. For pain   Yes [provider]  docusate sodium (COLACE) 100 MG capsule Take 2 capsules (200 mg total) by mouth 2 (two) times daily. 08/14/17 08/14/18 Yes McCormickLenna Sciara, MD  esomeprazole (NEXIUM) 20 MG capsule Take 20 mg by mouth daily as needed (heartburn).   Yes [provider]  HYDROcodone-acetaminophen (NORCO/VICODIN) 5-325 MG tablet Take 1-2 tablets by mouth every 6 (six) hours as needed for moderate pain (pain). 08/14/17  Yes Stasia Cavalier, MD     Vital Signs: BP 135/73 (BP Location: Left Arm)   Pulse 70   Temp 99.4 F (37.4 C) (Oral)   Resp 19   Ht 6' (1.829 m)   Wt 264 lb 8 oz (120 kg)   SpO2 97%   BMI 35.87 kg/m   Physical Exam alert; left PCN/rt abd drain intact, outputs 350 cc/143 cc respectively; insertion sites ok; yellow urine in bags, sl blood-tinged from PCN  Imaging: Dg Chest 2 View  Result Date: 10/09/2017 CLINICAL DATA:  Fever, shortness of breath. EXAM: CHEST  2 VIEW COMPARISON:  Radiographs of September 26, 2012. FINDINGS: The heart size and mediastinal contours are within normal limits. Hypoinflation of the lungs is noted with mild bibasilar subsegmental atelectasis. Minimal bilateral pleural effusions are noted. The visualized skeletal structures are unremarkable. IMPRESSION: Hypoinflation of the lungs with mild bibasilar subsegmental atelectasis and minimal bilateral pleural effusions. Electronically Signed   By: Marijo Conception, M.D.   On: 10/09/2017 13:04    Labs:  CBC: Recent Labs    10/08/17 0510 10/09/17 0506 10/10/17 0518 10/11/17 0532  WBC 11.8* 9.1 10.5 10.0  HGB 9.9* 9.6* 9.8* 9.4*  HCT 30.3* 29.8* 30.0* 29.4*  PLT 273 229 236 235    COAGS: Recent Labs    10/07/17 0947  INR 1.18    BMP: Recent Labs    10/09/17 0506 10/09/17 1647 10/10/17 0518 10/11/17 0532  NA 137 137 140 141  K 2.8* 2.8* 3.4* 3.0*  CL 110 109 112* 112*  CO2 22 23 23 24   GLUCOSE 93 109* 138* 103*  BUN 12 13 11 9   CALCIUM 7.2* 7.3* 7.5* 7.4*  CREATININE 0.87 0.81 0.93 0.75  GFRNONAA >60 >60 >60 >60  GFRAA >60 >60 >60 >60    LIVER FUNCTION TESTS: Recent Labs    10/04/17 0522 10/05/17 0602 10/07/17 0456  BILITOT 0.7 0.8 0.6  AST 28 16 14*  ALT 31 21 15*  ALKPHOS 67 61 64  PROT 6.5 5.6* 5.9*  ALBUMIN 2.7* 2.1* 2.3*    Assessment and Plan: Bladder/prostate cancer, s/p cystoprostatectomy/ileal conduit, colostomy after rectal injury 08/08/17; now with delayed urine leak, s/p urinoma drain placement on 10/04/17; s/p left PCN 10/07/17; temp 99.4, WBC, creat nl; hgb 9.4, stable; K 3.0- replace; abd fl cx- candida, few klebsiella- ID following; cont both drains for now; pt to be scheduled for left nephrostogram/poss ureteral stent placement as OP in [facility] weeks at [facility]; as OP should record output of both drains and  change dressings as needed; call [phone] with any questions regarding drains     Electronically Signed: D. Rowe Robert, PA-C 10/11/2017, 1:44 PM   I spent a total of 15 minutes at the the patient's bedside AND on the patient's hospital floor or unit, greater than 50% of which was counseling/coordinating care for right abdominal/ left nephrostomy drains      Patient ID: Kristopher Thompson, DDS, male   DOB: [birth year], 66 y.o.   MRN: [id number]

## 2017-10-11 NOTE — Progress Notes (Addendum)
PHARMACY CONSULT NOTE FOR:  OUTPATIENT  PARENTERAL ANTIBIOTIC THERAPY (OPAT)  Indication: Klebsiella pneumonia bacteremia, Infected urionma/abscess growing Candida albicans Regimen: Eraxis 100 mg IV q24h and Ceftriaxone 2g IV q24h End date: 10/23/2017  IV antibiotic discharge orders are pended. To discharging provider:  please sign these orders via discharge navigator,  Select New Orders & click on the button choice - Manage This Unsigned Work.     Thank you for allowing pharmacy to be a part of this patient's care.  Hershal Coria 10/11/2017, 12:24 PM   ADDENDUM: 10/11/2017 2:00 PM Received a call from Carolynn Sayers from [facility] that Caspofungin is the preferred formulary echinocandin for them.  Plan: Removed Eraxis discharge order and place pended discharge order for Caspofungin 50 mg IV q24h through 10/23/2017.  Hershal Coria, PharmD, BCPS Pager: [phone] 10/11/2017 2:01 PM

## 2017-10-12 LAB — POTASSIUM: POTASSIUM: 3.6 mmol/L (ref 3.5–5.1)

## 2017-10-12 LAB — BODY FLUID CULTURE: Special Requests: NORMAL

## 2017-10-12 MED ORDER — POTASSIUM CHLORIDE CRYS ER 20 MEQ PO TBCR
40.0000 meq | EXTENDED_RELEASE_TABLET | ORAL | Status: AC
  Administered 2017-10-12 (×2): 40 meq via ORAL
  Filled 2017-10-12 (×2): qty 2

## 2017-10-12 NOTE — Progress Notes (Signed)
Patient ID: Kristopher Thompson, DDS, male   DOB: 07-03-1951, 66 y.o.   MRN: [id number]    Referring Physician(s): Dr. Raynelle Bring  Supervising Physician: Aletta Edouard  Patient Status: [facility] - In-pt  Chief Complaint: Post op urine leak  Subjective: No new complaints.  PCN draining well.  Hoping to go home tomorrow  Allergies: Demerol [meperidine]  Medications: Prior to Admission medications   Medication Sig Start Date End Date Taking? Authorizing Provider  acetaminophen (TYLENOL) 500 MG tablet Take 1,000 mg by mouth daily as needed for mild pain. For pain   Yes [provider]  docusate sodium (COLACE) 100 MG capsule Take 2 capsules (200 mg total) by mouth 2 (two) times daily. 08/14/17 08/14/18 Yes McCormickLenna Sciara, MD  esomeprazole (NEXIUM) 20 MG capsule Take 20 mg by mouth daily as needed (heartburn).   Yes [provider]  HYDROcodone-acetaminophen (NORCO/VICODIN) 5-325 MG tablet Take 1-2 tablets by mouth every 6 (six) hours as needed for moderate pain (pain). 08/14/17  Yes Stasia Cavalier, MD    Vital Signs: BP (!) 161/83 (BP Location: Left Arm)   Pulse 77   Temp 99.1 F (37.3 C) (Oral)   Resp 19   Ht 6' (1.829 m)   Wt 257 lb 4.4 oz (116.7 kg)   SpO2 95%   BMI 34.89 kg/m   Physical Exam: Abd: soft, RLQ drain with minimal output currently.  Serous, 35cc documented yesterday.  L PCN with 2200cc yesterday.  Yellow urine present  Imaging: Dg Chest 2 View  Result Date: 10/09/2017 CLINICAL DATA:  Fever, shortness of breath. EXAM: CHEST  2 VIEW COMPARISON:  Radiographs of September 26, 2012. FINDINGS: The heart size and mediastinal contours are within normal limits. Hypoinflation of the lungs is noted with mild bibasilar subsegmental atelectasis. Minimal bilateral pleural effusions are noted. The visualized skeletal structures are unremarkable. IMPRESSION: Hypoinflation of the lungs with mild bibasilar subsegmental atelectasis and minimal  bilateral pleural effusions. Electronically Signed   By: Marijo Conception, M.D.   On: 10/09/2017 13:04    Labs:  CBC: Recent Labs    10/08/17 0510 10/09/17 0506 10/10/17 0518 10/11/17 0532  WBC 11.8* 9.1 10.5 10.0  HGB 9.9* 9.6* 9.8* 9.4*  HCT 30.3* 29.8* 30.0* 29.4*  PLT 273 229 236 235    COAGS: Recent Labs    10/07/17 0947  INR 1.18    BMP: Recent Labs    10/09/17 0506 10/09/17 1647 10/10/17 0518 10/11/17 0532  NA 137 137 140 141  K 2.8* 2.8* 3.4* 3.0*  CL 110 109 112* 112*  CO2 22 23 23 24   GLUCOSE 93 109* 138* 103*  BUN 12 13 11 9   CALCIUM 7.2* 7.3* 7.5* 7.4*  CREATININE 0.87 0.81 0.93 0.75  GFRNONAA >60 >60 >60 >60  GFRAA >60 >60 >60 >60    LIVER FUNCTION TESTS: Recent Labs    10/04/17 0522 10/05/17 0602 10/07/17 0456  BILITOT 0.7 0.8 0.6  AST 28 16 14*  ALT 31 21 15*  ALKPHOS 67 61 64  PROT 6.5 5.6* 5.9*  ALBUMIN 2.7* 2.1* 2.3*    Assessment and Plan: 1. S/p L PCN  And abdominal drain placement for post op urine leak  Patient is doing well and hoping to be DC tomorrow. He will need to flush the RLQ drain with 5cc of NS daily.  PCN does not necessarily need to be flushed. He will be scheduled in 2 weeks to return for possible JJ stent placement.  Orders are in place for all of this.  Electronically Signed: Henreitta Cea 10/12/2017, 3:28 PM   I spent a total of 15 Minutes at the the patient's bedside AND on the patient's hospital floor or unit, greater than 50% of which was counseling/coordinating care for post op urine leak

## 2017-10-12 NOTE — Progress Notes (Signed)
Received report from [facility] Agree with morning assessment. Novalyn Lajara, Bing Neighbors, RN

## 2017-10-12 NOTE — Progress Notes (Signed)
Subjective: Kristopher Thompson is doing well today without new complaints.   He has a left ureteral anastomotic leak that is managed with a left NT and JP.  The JP drainage has declined signficantly.   He remains on Eraxis and Zosyn and has a normal WBC and is afebrile.    ROS:  Review of Systems  Constitutional: Negative for fever.  Respiratory: Negative for shortness of breath.   Cardiovascular: Negative for chest pain.  Gastrointestinal: Negative for abdominal pain.    Anti-infectives: Anti-infectives (From admission, onward)   Start     Dose/Rate Route Frequency Ordered Stop   10/11/17 1400  anidulafungin (ERAXIS) 100 mg in sodium chloride 0.9 % 100 mL IVPB     100 mg 78 mL/hr over 100 Minutes Intravenous Every 24 hours 10/11/17 1211     10/11/17 1400  cefTRIAXone (ROCEPHIN) 2 g in dextrose 5 % 50 mL IVPB     2 g 100 mL/hr over 30 Minutes Intravenous Every 24 hours 10/11/17 1211     10/11/17 1000  fluconazole (DIFLUCAN) IVPB 400 mg  Status:  Discontinued     400 mg 100 mL/hr over 120 Minutes Intravenous Every 24 hours 10/10/17 2200 10/11/17 1211   10/10/17 1000  anidulafungin (ERAXIS) 100 mg in sodium chloride 0.9 % 100 mL IVPB  Status:  Discontinued     100 mg 78 mL/hr over 100 Minutes Intravenous Every 24 hours 10/09/17 1122 10/10/17 1658   10/09/17 1200  anidulafungin (ERAXIS) 200 mg in sodium chloride 0.9 % 200 mL IVPB     200 mg 78 mL/hr over 200 Minutes Intravenous  Once 10/09/17 1122 10/09/17 1640   10/05/17 1700  vancomycin (VANCOCIN) IVPB 1000 mg/200 mL premix  Status:  Discontinued     1,000 mg 200 mL/hr over 60 Minutes Intravenous Every 12 hours 10/05/17 1002 10/07/17 0915   10/04/17 0600  vancomycin (VANCOCIN) 1,750 mg in sodium chloride 0.9 % 500 mL IVPB  Status:  Discontinued     1,750 mg 250 mL/hr over 120 Minutes Intravenous Every 24 hours 10/04/17 0325 10/04/17 0445   10/04/17 0600  vancomycin (VANCOCIN) 1,250 mg in sodium chloride 0.9 % 250 mL IVPB  Status:   Discontinued     1,250 mg 166.7 mL/hr over 90 Minutes Intravenous Every 24 hours 10/04/17 0446 10/05/17 1002   10/04/17 0330  piperacillin-tazobactam (ZOSYN) IVPB 3.375 g  Status:  Discontinued     3.375 g 12.5 mL/hr over 240 Minutes Intravenous Every 8 hours 10/04/17 0322 10/11/17 1211   10/04/17 0300  vancomycin (VANCOCIN) 1,500 mg in sodium chloride 0.9 % 500 mL IVPB  Status:  Discontinued     1,500 mg 250 mL/hr over 120 Minutes Intravenous  Once 10/04/17 0252 10/04/17 0321   10/04/17 0300  piperacillin-tazobactam (ZOSYN) IVPB 3.375 g  Status:  Discontinued     3.375 g 100 mL/hr over 30 Minutes Intravenous  Once 10/04/17 0252 10/04/17 0320      Current Facility-Administered Medications  Medication Dose Route Frequency Provider Last Rate Last Dose  . acetaminophen (TYLENOL) tablet 650 mg  650 mg Oral Q6H PRN Oswald Hillock, MD   650 mg at 10/11/17 0150  . anidulafungin (ERAXIS) 100 mg in sodium chloride 0.9 % 100 mL IVPB  100 mg Intravenous Q24H Eugenie Filler, MD   Stopped at 10/11/17 1544  . cefTRIAXone (ROCEPHIN) 2 g in dextrose 5 % 50 mL IVPB  2 g Intravenous Q24H Eugenie Filler, MD   Stopped at  10/11/17 1445  . enoxaparin (LOVENOX) injection 40 mg  40 mg Subcutaneous Q24H Oswald Hillock, MD   40 mg at 10/11/17 0926  . HYDROmorphone (DILAUDID) injection 1 mg  1 mg Intravenous Q2H PRN Hosie Poisson, MD   1 mg at 10/06/17 0156  . iopamidol (ISOVUE-300) 61 % injection 50 mL  50 mL Other Once PRN Sandi Mariscal, MD      . lip balm (CARMEX) ointment 1 application  1 application Topical PRN Hosie Poisson, MD      . metoCLOPramide (REGLAN) injection 5 mg  5 mg Intravenous Q6H PRN Hosie Poisson, MD   5 mg at 10/07/17 1057  . ondansetron (ZOFRAN) tablet 4 mg  4 mg Oral Q6H PRN Oswald Hillock, MD       Or  . ondansetron [facility]) injection 4 mg  4 mg Intravenous Q6H PRN Oswald Hillock, MD   4 mg at 10/11/17 1240  . oxyCODONE-acetaminophen (PERCOCET/ROXICET) 5-325 MG per tablet 1-2 tablet  1-2  tablet Oral Q4H PRN Absher, Julieta Bellini, RPH   2 tablet at 10/05/17 1606  . pantoprazole (PROTONIX) EC tablet 40 mg  40 mg Oral Daily Leodis Sias T, RPH   40 mg at 10/11/17 5852  . phenol (CHLORASEPTIC) mouth spray 1 spray  1 spray Mouth/Throat PRN Hosie Poisson, MD   1 spray at 10/08/17 1845  . polyethylene glycol (MIRALAX / GLYCOLAX) packet 17 g  17 g Oral Daily Hosie Poisson, MD      . promethazine (PHENERGAN) injection 12.5 mg  12.5 mg Intravenous Q6H PRN Absher, Randall K, RPH   12.5 mg at 10/10/17 0931  . protein supplement (PREMIER PROTEIN) liquid  11 oz Oral BID BM Eugenie Filler, MD   11 oz at 10/11/17 7782  . senna-docusate (Senokot-S) tablet 2 tablet  2 tablet Oral BID Hosie Poisson, MD   2 tablet at 10/11/17 2156  . sodium chloride flush (NS) 0.9 % injection 10-40 mL  10-40 mL Intracatheter PRN Eugenie Filler, MD   10 mL at 10/12/17 0501  . sodium chloride flush (NS) 0.9 % injection 5 mL  5 mL Other Q8H Wofford, Drew A, RPH   5 mL at 10/12/17 4235     Objective: Vital signs in last 24 hours: Temp:  [97.5 F (36.4 C)-99.1 F (37.3 C)] 99.1 F (37.3 C) (12/01 0552) Pulse Rate:  [68-79] 77 (12/01 0552) Resp:  [16-20] 19 (12/01 0552) BP: (143-161)/(72-83) 161/83 (12/01 0552) SpO2:  [95 %-96 %] 95 % (12/01 0552) Weight:  [116.7 kg (257 lb 4.4 oz)-118.9 kg (262 lb 3.2 oz)] 116.7 kg (257 lb 4.4 oz) (11/30 2234)  Intake/Output from previous day: 11/30 0701 - 12/01 0700 In: 3614 [P.O.:1320; I.V.:10; IV Piggyback:180] Out: 4360 [Urine:4025; Drains:35; ERXVQ:008] Intake/Output this shift: Total I/O In: -  Out: 150 [Stool:150]   Physical Exam  Constitutional: He is oriented to person, place, and time and well-developed, well-nourished, and in no distress.  Cardiovascular: Normal rate, regular rhythm and normal heart sounds.  Pulmonary/Chest: Effort normal and breath sounds normal. No respiratory distress.  Abdominal: Soft. Bowel sounds are normal. There is no  tenderness.  RLQ urostomy is pink and productive. LLQ colostomy is productive.  Left NT is draining clear urine.     Musculoskeletal: Normal range of motion. He exhibits no edema or tenderness.  Neurological: He is alert and oriented to person, place, and time.    Lab Results:  Recent Labs    10/10/17 0518  10/11/17 0532  WBC 10.5 10.0  HGB 9.8* 9.4*  HCT 30.0* 29.4*  PLT 236 235   BMET Recent Labs    10/10/17 0518 10/11/17 0532  NA 140 141  K 3.4* 3.0*  CL 112* 112*  CO2 23 24  GLUCOSE 138* 103*  BUN 11 9  CREATININE 0.93 0.75  CALCIUM 7.5* 7.4*   PT/INR No results for input(s): LABPROT, INR in the last 72 hours. ABG No results for input(s): PHART, HCO3 in the last 72 hours.  Invalid input(s): PCO2, PO2  Studies/Results: No results found.   Assessment and Plan:   Delayed urine leak s/p radical cystectomy/ileal conduit 2 months ago  1) Urine leak: Continue abdominal drain and left nephrostomy.  JP drainage has declined markedly.  Plan is to leave both in place until next nephrostogram in about 2 weeks to ensure that leak has resolved before removal of drain. Outputs appear appropriate.  Will consider checking drain Cr level in next couple of days.  2) Fever: Unclear source.  Tmax 99.4 over last 24hrs.  Remains on Eraxis for fungus and Zosyn for Klebsiella blood culture. WBC normal.  3) GI: Encouraged patient to continue to improve his po intake.  4) Disp:  Will need to determine home health needs vs SNF placement with drain care and ambulatory status and need for PT as outpatient.       LOS: 8 days    Irine Seal 10/12/2017 [phone] ID: [name], DDS, male   DOB: 1951-01-04, 66 y.o.   MRN: [id number]

## 2017-10-12 NOTE — Progress Notes (Signed)
PROGRESS NOTE    Kristopher Thompson, DDS  UEA:[id number] DOB: [birth year] DOA: 10/04/2017 PCP: Lorene Dy, MD    Brief Narrative:  [name] a66 y.o.male,..With history of prostate cancer, bladder cancer status post radical cystectomy and ileal conduit urinary diversion on 4/78/2956, complicated by rectal injury which was repaired by general surgery status post diverting colostomy came to hospital with complaints of abdominal pain for past 2 days.Pt underwentCT guided percutaneous catheter drainage of urinoma. Urology and surgery consulted and on board. He also underwent PCN on the left by IR on 11/26. Pt continues to have fevers, on broad spectrum antibiotics.  ID was consulted and antifungal regimen added with fever curve trending down.      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 Status post ureteral stent removal 1120, status post percutaneous catheter drainage of urinoma 1123 with continued clear urine. Patient was pancultured blood cultures from 10/04/2017 positive for Klebsiella pneumonia. Repeat blood cultures from 10/06/2017 with no growth to date. Anaerobic and aerobic cultures with abundant WBCs, multiple organisms present none predominant no anaerobes isolated. Body fluid culture from  10/08/2017 with moderate WBC, few yeast with pseudohyphae, moderate Candida albicans, few Klebsiella pneumonia. Patient fever curve trending down after the addition of antifungal.  Urine cultures from 10/08/2017 with 50,000 colonies of Candida albicans.  Continue empiric IV Eraxis.  Changed IV Zosyn to IV Rocephin in discussions with ID as patient unable to be placed on Azole antifungal agents due to his QTC prolongation.  Patient will be treated with IV antifungal and IV antibiotics through December 12.  s/p PICC line.  ID following and I appreciate their input and recommendations.    #2. 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  Repeat blood cultures from 10/06/2017 still with no growth to date.  Sensitivities have resulted and resistant to ampicillin and ampicillin/sulbactam however sensitive to Zosyn.  IV vancomycin was discontinued 10/05/2017.  IV Zosyn has been discontinued and changed to IV Rocephin after discussions with ID.  Patient will also be continued on IV eraxis due to patient's history of QT prolongation.  Patient will need IV antibiotics through December 12 as recommended per ID.  PICC line placed.  ID following and I appreciate your input and recommendations.    #3 hypokalemia/hypophosphatemia Replete.  #4 anemia of chronic disease H&H stable.  #5 delayed urine leak status post radical cystectomy and ileal conduit 08/08/2017 Patient being followed by urology who recommended continuing abdominal drain and nephrostomy drainage with monitoring of outputs. Per urology.  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  Replete electrolytes.  Keep potassium greater than 4.  Magnesium greater than 2.  Repeat EKG in the morning.  Follow.   DVT prophylaxis: Lovenox Code Status: Full Family Communication: Updated patient.  No  family at bedside.   Disposition Plan:  Likely home with home health and okay with urology.    Consultants:   Urology: Dr. Junious Silk 10/04/2017  Interventional radiologist: Dr. Kathlene Cote 10/04/2017  ID Dr. Tommy Medal 10/09/2017  Procedures:   CT abdomen and pelvis 10/06/2017, 10/04/2017  Chest x-ray 10/09/2017  CT-guided percutaneous catheter drainage of urinoma anterior to ileal conduit per Dr. Kathlene Cote 10/04/2017  Ultrasound and fluoroscopy scopic guided placement of left sided 64 French or cutaneous nephrostomy, antegrade nephrostogram demonstrates a distal left ureteral injury with extravasation of contrast communicating with percutaneous drainage catheter per Dr. Pascal Lux 10/07/2017  PICC line placement 10/11/2017   Antimicrobials:   IV Eraxis 10/09/2017  IV vancomycin 10/04/2017>>>>10/05/2017  IV Zosyn 10/04/2017>>>> 10/11/2017  IV Rocephin 10/11/2017   Subjective: Patient denies chest pain.  No shortness of breath.  Slowly feeling better every day.  Appetite slowly picking up.    Objective: Vitals:   10/11/17 2234 10/12/17 0126 10/12/17 0552 10/12/17 1531  BP: (!) 145/72  (!) 161/83 (!) 145/77  Pulse: 79  77 79  Resp: 20  19 16   Temp: 98.9 F (37.2 C) 99.1 F (37.3 C) 99.1 F (37.3 C) 98.9 F (37.2 C)  TempSrc: Oral Oral Oral Oral  SpO2: 95%  95% 96%  Weight: 116.7 kg (257 lb 4.4 oz)     Height:        Intake/Output Summary (Last 24 hours) at 10/12/2017 1824 Last data filed at 10/12/2017 1803 Gross per 24 hour  Intake 375 ml  Output 4310 ml  Net -3935 ml   Filed Weights   10/10/17 1500 10/11/17 1654 10/11/17 2234  Weight: 120 kg (264 lb 8 oz) 118.9 kg (262 lb 3.2 oz) 116.7 kg (257 lb 4.4 oz)    Examination:  General exam: Appears calm and comfortable.NAD. Respiratory system: Lungs clear to auscultation bilaterally.   No wheezing.  No rhonchi. Respiratory effort normal. Cardiovascular system: Regular rate and rhythm no murmurs rubs or gallops.  No edema.   Gastrointestinal system: Abdomen  is soft, nontender, nondistended. No organomegaly or masses felt. Colostomy bag with stool. Nephrostomy bag draining. Urostomy bag draining. Normal bowel sounds heard. Central nervous system: Alert and oriented. No focal neurological deficits. Extremities: Symmetric 5 x 5 power. Skin: No rashes, lesions or ulcers Psychiatry: Judgement and insight appear normal. Mood & affect appropriate.     Data Reviewed: I have personally reviewed following labs and imaging studies  CBC: Recent Labs  Lab 10/07/17 0456 10/08/17 0510 10/09/17 0506 10/10/17 0518 10/11/17 0532  WBC 14.9* 11.8* 9.1 10.5 10.0  NEUTROABS  --   --   --   --  7.5  HGB 10.3* 9.9* 9.6* 9.8* 9.4*  HCT 32.2* 30.3* 29.8* 30.0* 29.4*  MCV 78.3 77.3* 77.4* 77.1* 76.8*  PLT 312 273 229 236 932   Basic Metabolic Panel: Recent Labs  Lab 10/06/17 1333  10/08/17 1943 10/09/17 0506 10/09/17 1647 10/10/17 0518 10/11/17 0532  NA 137   < > 140 137 137 140 141  K 3.1*   < > 2.8* 2.8* 2.8* 3.4* 3.0*  CL 107   < > 109 110 109 112* 112*  CO2 23   < > 24 22 23 23 24   GLUCOSE 99   < > 101* 93 109* 138* 103*  BUN 17   < > 13 12 13 11 9   CREATININE 0.90   < > 0.90 0.87 0.81 0.93 0.75  CALCIUM 7.9*   < > 7.4* 7.2*  7.3* 7.5* 7.4*  MG 2.0  --   --  1.8  --  2.3  --   PHOS 1.7*  --   --   --   --   --   --    < > = values in this interval not displayed.   GFR: Estimated Creatinine Clearance: 121.4 mL/min (by C-G formula based on SCr of 0.75 mg/dL). Liver Function Tests: Recent Labs  Lab 10/07/17 0456  AST 14*  ALT 15*  ALKPHOS 64  BILITOT 0.6  PROT 5.9*  ALBUMIN 2.3*   No results for input(s): LIPASE, AMYLASE in the last 168 hours. No results for input(s): AMMONIA in the last 168 hours. Coagulation Profile: Recent Labs  Lab 10/07/17 0947  INR 1.18   Cardiac Enzymes: No results for input(s): CKTOTAL, CKMB, CKMBINDEX, TROPONINI in the last 168 hours. BNP (last 3 results) No results for input(s): PROBNP in the last  8760 hours. HbA1C: No results for input(s): HGBA1C in the last 72 hours. CBG: No results for input(s): GLUCAP in the last 168 hours. Lipid Profile: No results for input(s): CHOL, HDL, LDLCALC, TRIG, CHOLHDL, LDLDIRECT in the last 72 hours. Thyroid Function Tests: No results for input(s): TSH, T4TOTAL, FREET4, T3FREE, THYROIDAB in the last 72 hours. Anemia Panel: No results for input(s): VITAMINB12, FOLATE, FERRITIN, TIBC, IRON, RETICCTPCT in the last 72 hours. Sepsis Labs: Recent Labs  Lab 10/06/17 0431  PROCALCITON 3.76    Recent Results (from the past 240 hour(s))  Culture, blood (Routine X 2) w Reflex to ID Panel     Status: Abnormal   Collection Time: 10/04/17  5:33 AM  Result Value Ref Range Status   Specimen Description BLOOD LEFT ANTECUBITAL  Final   Special Requests   Final    BOTTLES DRAWN AEROBIC AND ANAEROBIC Blood Culture adequate volume   Culture  Setup Time   Final    GRAM NEGATIVE RODS AEROBIC BOTTLE ONLY CRITICAL RESULT CALLED TO, READ BACK BY AND VERIFIED WITH: J. GRIMSLEY, RPHARMD ([place]) AT 0308 ON 10/05/17 BY C. JESSUP, MLT. Performed at [facility], [facility] [address], [place], [place] [address]    Culture KLEBSIELLA PNEUMONIAE (A)  Final   Report Status 10/07/2017 FINAL  Final   Organism ID, Bacteria KLEBSIELLA PNEUMONIAE  Final      Susceptibility   Klebsiella pneumoniae - MIC*    AMPICILLIN >=32 RESISTANT Resistant     CEFAZOLIN <=4 SENSITIVE Sensitive     CEFEPIME <=1 SENSITIVE Sensitive     CEFTAZIDIME <=1 SENSITIVE Sensitive     CEFTRIAXONE <=1 SENSITIVE Sensitive     CIPROFLOXACIN <=0.25 SENSITIVE Sensitive     GENTAMICIN <=1 SENSITIVE Sensitive     IMIPENEM <=0.25 SENSITIVE Sensitive     TRIMETH/SULFA <=20 SENSITIVE Sensitive     AMPICILLIN/SULBACTAM >=32 RESISTANT Resistant     PIP/TAZO 16 SENSITIVE Sensitive     Extended ESBL NEGATIVE Sensitive     * KLEBSIELLA PNEUMONIAE  Blood Culture ID Panel (Reflexed)     Status: Abnormal    Collection Time: 10/04/17  5:33 AM  Result Value Ref Range Status   Enterococcus species NOT DETECTED NOT DETECTED Final   Listeria monocytogenes NOT DETECTED NOT DETECTED Final   Staphylococcus species NOT DETECTED NOT DETECTED Final   Staphylococcus aureus NOT DETECTED NOT DETECTED Final   Streptococcus species NOT DETECTED NOT DETECTED Final   Streptococcus agalactiae NOT DETECTED NOT DETECTED Final   Streptococcus pneumoniae NOT DETECTED NOT DETECTED Final  Streptococcus pyogenes NOT DETECTED NOT DETECTED Final   Acinetobacter baumannii NOT DETECTED NOT DETECTED Final   Enterobacteriaceae species DETECTED (A) NOT DETECTED Final    Comment: Enterobacteriaceae represent a large family of gram-negative bacteria, not a single organism. CRITICAL RESULT CALLED TO, READ BACK BY AND VERIFIED WITH: J. GRIMSLEY, RPHARMD ([place]) AT 0308 ON 10/05/17 BY C. JESSUP, MLT.    Enterobacter cloacae complex NOT DETECTED NOT DETECTED Final   Escherichia coli NOT DETECTED NOT DETECTED Final   Klebsiella oxytoca NOT DETECTED NOT DETECTED Final   Klebsiella pneumoniae DETECTED (A) NOT DETECTED Final    Comment: CRITICAL RESULT CALLED TO, READ BACK BY AND VERIFIED WITH: J. GRIMSLEY, RPHARMD (WL) AT 0308 ON 10/05/17 BY C. JESSUP, MLT.    Proteus species NOT DETECTED NOT DETECTED Final   Serratia marcescens NOT DETECTED NOT DETECTED Final   Carbapenem resistance NOT DETECTED NOT DETECTED Final   Haemophilus influenzae NOT DETECTED NOT DETECTED Final   Neisseria meningitidis NOT DETECTED NOT DETECTED Final   Pseudomonas aeruginosa NOT DETECTED NOT DETECTED Final   Candida albicans NOT DETECTED NOT DETECTED Final   Candida glabrata NOT DETECTED NOT DETECTED Final   Candida krusei NOT DETECTED NOT DETECTED Final   Candida parapsilosis NOT DETECTED NOT DETECTED Final   Candida tropicalis NOT DETECTED NOT DETECTED Final    Comment: Performed at [facility], [place] [address], [place], [place] [address]    Culture, blood (Routine X 2) w Reflex to ID Panel     Status: None   Collection Time: 10/04/17  5:35 AM  Result Value Ref Range Status   Specimen Description BLOOD RIGHT ANTECUBITAL  Final   Special Requests   Final    BOTTLES DRAWN AEROBIC AND ANAEROBIC Blood Culture adequate volume   Culture   Final    NO GROWTH 5 DAYS Performed at [facility], [place] [address], [place], [place] [address]    Report Status 10/09/2017 FINAL  Final  Culture, Urine     Status: Abnormal   Collection Time: 10/04/17  5:47 AM  Result Value Ref Range Status   Specimen Description URINE, RANDOM  Final   Special Requests NONE  Final   Culture MULTIPLE SPECIES PRESENT, SUGGEST RECOLLECTION (A)  Final   Report Status 10/05/2017 FINAL  Final  Aerobic/Anaerobic Culture (surgical/deep wound)     Status: Abnormal   Collection Time: 10/04/17 12:19 PM  Result Value Ref Range Status   Specimen Description ABSCESS  Final   Special Requests Normal  Final   Gram Stain   Final    ABUNDANT WBC PRESENT,BOTH PMN AND MONONUCLEAR ABUNDANT GRAM VARIABLE ROD FEW GRAM POSITIVE COCCI IN PAIRS IN CHAINS    Culture (A)  Final    MULTIPLE ORGANISMS PRESENT, NONE PREDOMINANT NO ANAEROBES ISOLATED Performed at [facility], [place] [address], [place], [place] [address]    Report Status 10/09/2017 FINAL  Final  Culture, blood (Routine X 2) w Reflex to ID Panel     Status: None   Collection Time: 10/06/17  9:42 AM  Result Value Ref Range Status   Specimen Description BLOOD RIGHT ANTECUBITAL  Final   Special Requests   Final    BOTTLES DRAWN AEROBIC AND ANAEROBIC Blood Culture results may not be optimal due to an excessive volume of blood received in culture bottles   Culture   Final    NO GROWTH 5 DAYS Performed at [facility], [place],  [place] [address]    Report Status 10/11/2017 FINAL  Final  Culture, blood (Routine X 2) w Reflex to ID Panel     Status: None   Collection Time: 10/06/17   9:50 AM  Result Value Ref Range Status   Specimen Description BLOOD LEFT ANTECUBITAL  Final   Special Requests IN PEDIATRIC BOTTLE Blood Culture adequate volume  Final   Culture   Final    NO GROWTH 5 DAYS Performed at [facility], [place] [address] [place], [place] [address]    Report Status 10/11/2017 FINAL  Final  Urine Culture     Status: Abnormal   Collection Time: 10/08/17  4:32 PM  Result Value Ref Range Status   Specimen Description KIDNEY LEFT  Final   Special Requests Normal  Final   Culture 50,000 COLONIES/mL CANDIDA ALBICANS (A)  Final   Report Status 10/10/2017 FINAL  Final  Body fluid culture     Status: None   Collection Time: 10/08/17  6:16 PM  Result Value Ref Range Status   Specimen Description ABDOMEN  Final   Special Requests Normal  Final   Gram Stain   Final    MODERATE WBC PRESENT, PREDOMINANTLY PMN FEW YEAST WITH PSEUDOHYPHAE Performed at [facility], [place] [address], [place], [place] [address]    Culture   Final    MODERATE CANDIDA ALBICANS FEW KLEBSIELLA PNEUMONIAE    Report Status 10/12/2017 FINAL  Final   Organism ID, Bacteria KLEBSIELLA PNEUMONIAE  Final      Susceptibility   Klebsiella pneumoniae - MIC*    AMPICILLIN >=32 RESISTANT Resistant     CEFAZOLIN <=4 SENSITIVE Sensitive     CEFEPIME <=1 SENSITIVE Sensitive     CEFTAZIDIME <=1 SENSITIVE Sensitive     CEFTRIAXONE <=1 SENSITIVE Sensitive     CIPROFLOXACIN <=0.25 SENSITIVE Sensitive     GENTAMICIN <=1 SENSITIVE Sensitive     IMIPENEM <=0.25 SENSITIVE Sensitive     TRIMETH/SULFA <=20 SENSITIVE Sensitive     AMPICILLIN/SULBACTAM >=32 RESISTANT Resistant     PIP/TAZO 16 SENSITIVE Sensitive     Extended ESBL NEGATIVE Sensitive     * FEW KLEBSIELLA PNEUMONIAE         Radiology Studies: No results found.      Scheduled Meds: . enoxaparin (LOVENOX) injection  40 mg Subcutaneous Q24H  . pantoprazole  40 mg Oral Daily  . polyethylene glycol  17 g Oral Daily  .  protein supplement shake  11 oz Oral BID BM  . senna-docusate  2 tablet Oral BID  . sodium chloride flush  5 mL Other Q8H   Continuous Infusions: . anidulafungin Stopped (10/12/17 1632)  . cefTRIAXone (ROCEPHIN)  IV Stopped (10/12/17 1448)     LOS: 8 days    Time spent: 35 minutes    Irine Seal, MD [facility] Pager [phone]  If 7PM-7AM, please contact night-coverage [email or web address] Password [facility] 10/12/2017, 6:24 PM

## 2017-10-13 LAB — CBC
HCT: 30.7 % — ABNORMAL LOW (ref 39.0–52.0)
HEMOGLOBIN: 9.9 g/dL — AB (ref 13.0–17.0)
MCH: 25 pg — AB (ref 26.0–34.0)
MCHC: 32.2 g/dL (ref 30.0–36.0)
MCV: 77.5 fL — ABNORMAL LOW (ref 78.0–100.0)
PLATELETS: 259 10*3/uL (ref 150–400)
RBC: 3.96 MIL/uL — AB (ref 4.22–5.81)
RDW: 15.4 % (ref 11.5–15.5)
WBC: 11 10*3/uL — AB (ref 4.0–10.5)

## 2017-10-13 LAB — BASIC METABOLIC PANEL
Anion gap: 8 (ref 5–15)
BUN: 8 mg/dL (ref 6–20)
CHLORIDE: 106 mmol/L (ref 101–111)
CO2: 25 mmol/L (ref 22–32)
Calcium: 8.1 mg/dL — ABNORMAL LOW (ref 8.9–10.3)
Creatinine, Ser: 0.59 mg/dL — ABNORMAL LOW (ref 0.61–1.24)
GFR calc Af Amer: >60 mL/min (ref >60)
Glucose, Bld: 100 mg/dL — ABNORMAL HIGH (ref 65–99)
POTASSIUM: 3.6 mmol/L (ref 3.5–5.1)
SODIUM: 139 mmol/L (ref 135–145)

## 2017-10-13 LAB — MAGNESIUM: MAGNESIUM: 2 mg/dL (ref 1.7–2.4)

## 2017-10-13 LAB — PHOSPHORUS: PHOSPHORUS: 3.2 mg/dL (ref 2.5–4.6)

## 2017-10-13 NOTE — Progress Notes (Signed)
Subjective: Kristopher Thompson is doing well today without new complaints.   He has a left ureteral anastomotic leak that is managed with a left NT and JP.  The JP drainage has increased markedly over the last 24 hours but he continues to have good urine production from the ileal conduit and left nephrostomy tube.  He remains on Eraxis and Zosyn and has a normal WBC and is afebrile.      ROS:  Review of Systems  All other systems reviewed and are negative.   Anti-infectives: Anti-infectives (From admission, onward)   Start     Dose/Rate Route Frequency Ordered Stop   10/11/17 1400  anidulafungin (ERAXIS) 100 mg in sodium chloride 0.9 % 100 mL IVPB     100 mg 78 mL/hr over 100 Minutes Intravenous Every 24 hours 10/11/17 1211     10/11/17 1400  cefTRIAXone (ROCEPHIN) 2 g in dextrose 5 % 50 mL IVPB     2 g 100 mL/hr over 30 Minutes Intravenous Every 24 hours 10/11/17 1211     10/11/17 1000  fluconazole (DIFLUCAN) IVPB 400 mg  Status:  Discontinued     400 mg 100 mL/hr over 120 Minutes Intravenous Every 24 hours 10/10/17 2200 10/11/17 1211   10/10/17 1000  anidulafungin (ERAXIS) 100 mg in sodium chloride 0.9 % 100 mL IVPB  Status:  Discontinued     100 mg 78 mL/hr over 100 Minutes Intravenous Every 24 hours 10/09/17 1122 10/10/17 1658   10/09/17 1200  anidulafungin (ERAXIS) 200 mg in sodium chloride 0.9 % 200 mL IVPB     200 mg 78 mL/hr over 200 Minutes Intravenous  Once 10/09/17 1122 10/09/17 1640   10/05/17 1700  vancomycin (VANCOCIN) IVPB 1000 mg/200 mL premix  Status:  Discontinued     1,000 mg 200 mL/hr over 60 Minutes Intravenous Every 12 hours 10/05/17 1002 10/07/17 0915   10/04/17 0600  vancomycin (VANCOCIN) 1,750 mg in sodium chloride 0.9 % 500 mL IVPB  Status:  Discontinued     1,750 mg 250 mL/hr over 120 Minutes Intravenous Every 24 hours 10/04/17 0325 10/04/17 0445   10/04/17 0600  vancomycin (VANCOCIN) 1,250 mg in sodium chloride 0.9 % 250 mL IVPB  Status:  Discontinued      1,250 mg 166.7 mL/hr over 90 Minutes Intravenous Every 24 hours 10/04/17 0446 10/05/17 1002   10/04/17 0330  piperacillin-tazobactam (ZOSYN) IVPB 3.375 g  Status:  Discontinued     3.375 g 12.5 mL/hr over 240 Minutes Intravenous Every 8 hours 10/04/17 0322 10/11/17 1211   10/04/17 0300  vancomycin (VANCOCIN) 1,500 mg in sodium chloride 0.9 % 500 mL IVPB  Status:  Discontinued     1,500 mg 250 mL/hr over 120 Minutes Intravenous  Once 10/04/17 0252 10/04/17 0321   10/04/17 0300  piperacillin-tazobactam (ZOSYN) IVPB 3.375 g  Status:  Discontinued     3.375 g 100 mL/hr over 30 Minutes Intravenous  Once 10/04/17 0252 10/04/17 0320      Current Facility-Administered Medications  Medication Dose Route Frequency Provider Last Rate Last Dose  . acetaminophen (TYLENOL) tablet 650 mg  650 mg Oral Q6H PRN Oswald Hillock, MD   650 mg at 10/11/17 0150  . anidulafungin (ERAXIS) 100 mg in sodium chloride 0.9 % 100 mL IVPB  100 mg Intravenous Q24H Eugenie Filler, MD   Stopped at 10/12/17 [phone]  . cefTRIAXone (ROCEPHIN) 2 g in dextrose 5 % 50 mL IVPB  2 g Intravenous Q24H Eugenie Filler, MD  Stopped at 10/12/17 1448  . enoxaparin (LOVENOX) injection 40 mg  40 mg Subcutaneous Q24H Oswald Hillock, MD   40 mg at 10/12/17 1020  . HYDROmorphone (DILAUDID) injection 1 mg  1 mg Intravenous Q2H PRN Hosie Poisson, MD   1 mg at 10/06/17 0156  . iopamidol (ISOVUE-300) 61 % injection 50 mL  50 mL Other Once PRN Sandi Mariscal, MD      . lip balm (CARMEX) ointment 1 application  1 application Topical PRN Hosie Poisson, MD      . metoCLOPramide (REGLAN) injection 5 mg  5 mg Intravenous Q6H PRN Hosie Poisson, MD   5 mg at 10/07/17 1057  . ondansetron (ZOFRAN) tablet 4 mg  4 mg Oral Q6H PRN Oswald Hillock, MD       Or  . ondansetron [facility]) injection 4 mg  4 mg Intravenous Q6H PRN Oswald Hillock, MD   4 mg at 10/11/17 1240  . oxyCODONE-acetaminophen (PERCOCET/ROXICET) 5-325 MG per tablet 1-2 tablet  1-2 tablet Oral Q4H  PRN Absher, Julieta Bellini, RPH   2 tablet at 10/05/17 1606  . pantoprazole (PROTONIX) EC tablet 40 mg  40 mg Oral Daily Leodis Sias T, RPH   40 mg at 10/12/17 1020  . phenol (CHLORASEPTIC) mouth spray 1 spray  1 spray Mouth/Throat PRN Hosie Poisson, MD   1 spray at 10/08/17 1845  . polyethylene glycol (MIRALAX / GLYCOLAX) packet 17 g  17 g Oral Daily Hosie Poisson, MD      . promethazine (PHENERGAN) injection 12.5 mg  12.5 mg Intravenous Q6H PRN Absher, Randall K, RPH   12.5 mg at 10/10/17 0931  . protein supplement (PREMIER PROTEIN) liquid  11 oz Oral BID BM Eugenie Filler, MD   11 oz at 10/11/17 6269  . senna-docusate (Senokot-S) tablet 2 tablet  2 tablet Oral BID Hosie Poisson, MD   2 tablet at 10/12/17 2230  . sodium chloride flush (NS) 0.9 % injection 10-40 mL  10-40 mL Intracatheter PRN Eugenie Filler, MD   10 mL at 10/12/17 2013  . sodium chloride flush (NS) 0.9 % injection 5 mL  5 mL Other Q8H Wofford, Drew A, RPH   5 mL at 10/13/17 4854     Objective: Vital signs in last 24 hours: Temp:  [98.6 F (37 C)-99.2 F (37.3 C)] 98.6 F (37 C) (12/02 6270) Pulse Rate:  [77-80] 77 (12/02 0614) Resp:  [16-20] 20 (12/02 3500) BP: (138-146)/(77-88) 146/88 (12/02 0614) SpO2:  [95 %-96 %] 95 % (12/02 0614) Weight:  [114 kg (251 lb 5.2 oz)] 114 kg (251 lb 5.2 oz) (12/02 9381)  Intake/Output from previous day: 12/01 0701 - 12/02 0700 In: 1 [P.O.:360] Out: 4125 [Urine:2800; Drains:1175; Stool:150] Intake/Output this shift: No intake/output data recorded.   Physical Exam  Constitutional: He is well-developed, well-nourished, and in no distress.  Vitals reviewed.   Lab Results:  Recent Labs    10/11/17 0532 10/13/17 0608  WBC 10.0 11.0*  HGB 9.4* 9.9*  HCT 29.4* 30.7*  PLT 235 259   BMET Recent Labs    10/11/17 0532 10/12/17 1851  NA 141  --   K 3.0* 3.6  CL 112*  --   CO2 24  --   GLUCOSE 103*  --   BUN 9  --   CREATININE 0.75  --   CALCIUM 7.4*  --     PT/INR No results for input(s): LABPROT, INR in the last 72 hours. ABG No  results for input(s): PHART, HCO3 in the last 72 hours.  Invalid input(s): PCO2, PO2  Studies/Results: No results found.   Assessment and Plan: Left ureteral anastomotic leak with sepsis.  He is doing well on current therapy but had marked increase in the abdominal drain output.   This is possibly related to clearance of 3rd spaced fluid from the sepsis.   No change in current plan.        LOS: 9 days    Irine Seal 10/13/2017 [phone] ID: Kristopher Thompson, DDS, male   DOB: June 05, 1951, 66 y.o.   MRN: [id number]

## 2017-10-13 NOTE — Progress Notes (Signed)
PROGRESS NOTE    Kristopher Thompson, DDS  QIH:[id number] DOB: 1951-10-20 DOA: 10/04/2017 PCP: Lorene Dy, MD    Brief Narrative:  [name] a66 y.o.male,..With history of prostate cancer, bladder cancer status post radical cystectomy and ileal conduit urinary diversion on 8/75/6433, complicated by rectal injury which was repaired by general surgery status post diverting colostomy came to hospital with complaints of abdominal pain for past 2 days.Pt underwentCT guided percutaneous catheter drainage of urinoma. Urology and surgery consulted and on board. He also underwent PCN on the left by IR on 11/26. Pt continues to have fevers, on broad spectrum antibiotics.  ID was consulted and antifungal regimen added with fever curve trending down.      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 Status post ureteral stent removal 1120, status post percutaneous catheter drainage of urinoma 1123 with continued clear urine. Patient was pancultured blood cultures from 10/04/2017 positive for Klebsiella pneumonia. Repeat blood cultures from 10/06/2017 with no growth to date. Anaerobic and aerobic cultures with abundant WBCs, multiple organisms present none predominant no anaerobes isolated. Body fluid culture from  10/08/2017 with moderate WBC, few yeast with pseudohyphae, moderate Candida albicans, few Klebsiella pneumonia. Patient fever curve trending down after the addition of antifungal.  Urine cultures from 10/08/2017 with 50,000 colonies of Candida albicans.  Continue empiric IV Eraxis.  Changed IV Zosyn to IV Rocephin in discussions with ID as patient unable to be placed on Azole antifungal agents due to his QTC prolongation.  Patient will be treated with IV antifungal and IV antibiotics through December 12.  s/p PICC line.  ID following and I appreciate their input and recommendations.    #2. 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  Patient currently afebrile.  Repeat blood cultures from 10/06/2017 still with no growth to date.  Sensitivities have resulted and resistant to ampicillin and ampicillin/sulbactam however sensitive to Zosyn.  IV vancomycin was discontinued 10/05/2017.  IV Zosyn has been discontinued and changed to IV Rocephin after discussions with ID.  Patient will also be continued on IV eraxis due to patient's history of QT prolongation.  Patient will need IV antibiotics through December 12 as recommended per ID.  PICC line placed.  ID following and I appreciate your input and recommendations.    #3 hypokalemia/hypophosphatemia Replete.  #4 anemia of chronic disease H&H stable.  #5 delayed urine leak status post radical cystectomy and ileal conduit 08/08/2017 Patient being followed by urology who recommended continuing abdominal drain and nephrostomy drainage with monitoring of outputs.  Drains with 1.175 cc output over the past 24 hours.  Per urology.  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  Replete electrolytes.  Keep potassium greater than 4.  Magnesium greater than 2.  Repeat EKG in the morning.  Follow.   DVT prophylaxis: Lovenox Code Status:  Full Family Communication: Updated patient and wife at bedside. Disposition Plan: Likely home with home health and okay with urology.    Consultants:   Urology: Dr. Junious Silk 10/04/2017  Interventional radiologist: Dr. Kathlene Cote 10/04/2017  ID Dr. Tommy Medal 10/09/2017  Procedures:   CT abdomen and pelvis 10/06/2017, 10/04/2017  Chest x-ray 10/09/2017  CT-guided percutaneous catheter drainage of urinoma anterior to ileal conduit per Dr. Kathlene Cote 10/04/2017  Ultrasound and fluoroscopy scopic guided placement of left sided 1 French or cutaneous nephrostomy, antegrade nephrostogram demonstrates a distal left ureteral injury with extravasation of contrast communicating with percutaneous drainage catheter per Dr. Pascal Lux 10/07/2017  PICC line placement 10/11/2017   Antimicrobials:   IV Eraxis 10/09/2017>>>>>>>> October 23, 2017  IV vancomycin 10/04/2017>>>>10/05/2017  IV Zosyn 10/04/2017>>>> 10/11/2017  IV Rocephin 10/11/2017>>>>>> October 23, 2017   Subjective: Patient sleeping, easily arousable.  Denies any shortness of breath.  No chest pain.  States appetite is improving.    Objective: Vitals:   10/12/17 0552 10/12/17 1531 10/12/17 2140 10/13/17 0614  BP: (!) 161/83 (!) 145/77 138/86 (!) 146/88  Pulse: 77 79 80 77  Resp: 19 16 18 20   Temp: 99.1 F (37.3 C) 98.9 F (37.2 C) 99.2 F (37.3 C) 98.6 F (37 C)  TempSrc: Oral Oral Oral Oral  SpO2: 95% 96% 96% 95%  Weight:    114 kg (251 lb 5.2 oz)  Height:        Intake/Output Summary (Last 24 hours) at 10/13/2017 1321 Last data filed at 10/13/2017 0622 Gross per 24 hour  Intake 125 ml  Output 3675 ml  Net -3550 ml   Filed Weights   10/11/17 1654 10/11/17 2234 10/13/17 0614  Weight: 118.9 kg (262 lb 3.2 oz) 116.7 kg (257 lb 4.4 oz) 114 kg (251 lb 5.2 oz)    Examination:  General exam: Sleeping easily arousable. Respiratory system: Lungs clear to auscultation bilaterally.   No wheezing.  No rhonchi.  No  crackles.  Respiratory effort normal. Cardiovascular system: Regular rate and rhythm no murmurs rubs or gallops.  No edema.   Gastrointestinal system: Abdomen is soft, nontender, nondistended.  Positive bowel sounds.  No organomegaly or masses felt. Colostomy bag with stool. Nephrostomy bag draining. Urostomy bag draining.  Central nervous system: Alert and oriented. No focal neurological deficits. Extremities: Symmetric 5 x 5 power. Skin: No rashes, lesions or ulcers Psychiatry: Judgement and insight appear normal. Mood & affect appropriate.     Data Reviewed: I have personally reviewed following labs and imaging studies  CBC: Recent Labs  Lab 10/08/17 0510 10/09/17 0506 10/10/17 0518 10/11/17 0532 10/13/17 0608  WBC 11.8* 9.1 10.5 10.0 11.0*  NEUTROABS  --   --   --  7.5  --   HGB 9.9* 9.6* 9.8* 9.4* 9.9*  HCT 30.3* 29.8* 30.0* 29.4* 30.7*  MCV 77.3* 77.4* 77.1* 76.8* 77.5*  PLT 273 229 236 235 401   Basic Metabolic Panel: Recent Labs  Lab 10/06/17 1333  10/09/17 0506 10/09/17 1647 10/10/17 0518 10/11/17 0532 10/12/17 1851 10/13/17 0608  NA 137   < > 137 137 140 141  --  139  K 3.1*   < > 2.8* 2.8* 3.4* 3.0* 3.6 3.6  CL 107   < > 110 109 112* 112*  --  106  CO2 23   < > 22 23 23 24   --  25  GLUCOSE 99   < > 93 109* 138* 103*  --  100*  BUN 17   < > 12 13 11 9   --  8  CREATININE 0.90   < > 0.87 0.81 0.93 0.75  --  0.59*  CALCIUM 7.9*   < > 7.2* 7.3* 7.5* 7.4*  --  8.1*  MG 2.0  --  1.8  --  2.3  --   --  2.0  PHOS 1.7*  --   --   --   --   --   --  3.2   < > = values in this interval not displayed.   GFR: Estimated Creatinine Clearance: 120.1 mL/min (A) (by C-G formula based on SCr of 0.59 mg/dL (L)). Liver Function Tests: Recent Labs  Lab 10/07/17 0456  AST 14*  ALT 15*  ALKPHOS 64  BILITOT 0.6  PROT 5.9*  ALBUMIN 2.3*   No results for input(s): LIPASE, AMYLASE in the last 168 hours. No results for input(s): AMMONIA in the last 168 hours. Coagulation  Profile: Recent Labs  Lab 10/07/17 0947  INR 1.18   Cardiac Enzymes: No results for input(s): CKTOTAL, CKMB, CKMBINDEX, TROPONINI in the last 168 hours. BNP (last 3 results) No results for input(s): PROBNP in the last 8760 hours. HbA1C: No results for input(s): HGBA1C in the last 72 hours. CBG: No results for input(s): GLUCAP in the last 168 hours. Lipid Profile: No results for input(s): CHOL, HDL, LDLCALC, TRIG, CHOLHDL, LDLDIRECT in the last 72 hours. Thyroid Function Tests: No results for input(s): TSH, T4TOTAL, FREET4, T3FREE, THYROIDAB in the last 72 hours. Anemia Panel: No results for input(s): VITAMINB12, FOLATE, FERRITIN, TIBC, IRON, RETICCTPCT in the last 72 hours. Sepsis Labs: No results for input(s): PROCALCITON, LATICACIDVEN in the last 168 hours.  Recent Results (from the past 240 hour(s))  Culture, blood (Routine X 2) w Reflex to ID Panel     Status: Abnormal   Collection Time: 10/04/17  5:33 AM  Result Value Ref Range Status   Specimen Description BLOOD LEFT ANTECUBITAL  Final   Special Requests   Final    BOTTLES DRAWN AEROBIC AND ANAEROBIC Blood Culture adequate volume   Culture  Setup Time   Final    GRAM NEGATIVE RODS AEROBIC BOTTLE ONLY CRITICAL RESULT CALLED TO, READ BACK BY AND VERIFIED WITH: J. GRIMSLEY, RPHARMD ([place]) AT 0308 ON 10/05/17 BY C. JESSUP, MLT. Performed at [facility], [place] [address], [place], [place] [address]    Culture KLEBSIELLA PNEUMONIAE (A)  Final   Report Status 10/07/2017 FINAL  Final   Organism ID, Bacteria KLEBSIELLA PNEUMONIAE  Final      Susceptibility   Klebsiella pneumoniae - MIC*    AMPICILLIN >=32 RESISTANT Resistant     CEFAZOLIN <=4 SENSITIVE Sensitive     CEFEPIME <=1 SENSITIVE Sensitive     CEFTAZIDIME <=1 SENSITIVE Sensitive     CEFTRIAXONE <=1 SENSITIVE Sensitive     CIPROFLOXACIN <=0.25 SENSITIVE Sensitive     GENTAMICIN <=1 SENSITIVE Sensitive     IMIPENEM <=0.25 SENSITIVE Sensitive     TRIMETH/SULFA  <=20 SENSITIVE Sensitive     AMPICILLIN/SULBACTAM >=32 RESISTANT Resistant     PIP/TAZO 16 SENSITIVE Sensitive     Extended ESBL NEGATIVE Sensitive     * KLEBSIELLA PNEUMONIAE  Blood Culture ID Panel (Reflexed)     Status: Abnormal   Collection Time: 10/04/17  5:33 AM  Result Value Ref Range Status   Enterococcus species NOT DETECTED NOT DETECTED Final   Listeria monocytogenes NOT DETECTED NOT DETECTED Final  Staphylococcus species NOT DETECTED NOT DETECTED Final   Staphylococcus aureus NOT DETECTED NOT DETECTED Final   Streptococcus species NOT DETECTED NOT DETECTED Final   Streptococcus agalactiae NOT DETECTED NOT DETECTED Final   Streptococcus pneumoniae NOT DETECTED NOT DETECTED Final   Streptococcus pyogenes NOT DETECTED NOT DETECTED Final   Acinetobacter baumannii NOT DETECTED NOT DETECTED Final   Enterobacteriaceae species DETECTED (A) NOT DETECTED Final    Comment: Enterobacteriaceae represent a large family of gram-negative bacteria, not a single organism. CRITICAL RESULT CALLED TO, READ BACK BY AND VERIFIED WITH: J. GRIMSLEY, RPHARMD ([place]) AT 0308 ON 10/05/17 BY C. JESSUP, MLT.    Enterobacter cloacae complex NOT DETECTED NOT DETECTED Final   Escherichia coli NOT DETECTED NOT DETECTED Final   Klebsiella oxytoca NOT DETECTED NOT DETECTED Final   Klebsiella pneumoniae DETECTED (A) NOT DETECTED Final    Comment: CRITICAL RESULT CALLED TO, READ BACK BY AND VERIFIED WITH: J. GRIMSLEY, RPHARMD ([place]) AT 0308 ON 10/05/17 BY C. JESSUP, MLT.    Proteus species NOT DETECTED NOT DETECTED Final   Serratia marcescens NOT DETECTED NOT DETECTED Final   Carbapenem resistance NOT DETECTED NOT DETECTED Final   Haemophilus influenzae NOT DETECTED NOT DETECTED Final   Neisseria meningitidis NOT DETECTED NOT DETECTED Final   Pseudomonas aeruginosa NOT DETECTED NOT DETECTED Final   Candida albicans NOT DETECTED NOT DETECTED Final   Candida glabrata NOT DETECTED NOT DETECTED Final   Candida  krusei NOT DETECTED NOT DETECTED Final   Candida parapsilosis NOT DETECTED NOT DETECTED Final   Candida tropicalis NOT DETECTED NOT DETECTED Final    Comment: Performed at [facility], [place] [address], [place], [place] [address]  Culture, blood (Routine X 2) w Reflex to ID Panel     Status: None   Collection Time: 10/04/17  5:35 AM  Result Value Ref Range Status   Specimen Description BLOOD RIGHT ANTECUBITAL  Final   Special Requests   Final    BOTTLES DRAWN AEROBIC AND ANAEROBIC Blood Culture adequate volume   Culture   Final    NO GROWTH 5 DAYS Performed at [facility], [place] [address] [place], [place] [address]    Report Status 10/09/2017 FINAL  Final  Culture, Urine     Status: Abnormal   Collection Time: 10/04/17  5:47 AM  Result Value Ref Range Status   Specimen Description URINE, RANDOM  Final   Special Requests NONE  Final   Culture MULTIPLE SPECIES PRESENT, SUGGEST RECOLLECTION (A)  Final   Report Status 10/05/2017 FINAL  Final  Aerobic/Anaerobic Culture (surgical/deep wound)     Status: Abnormal   Collection Time: 10/04/17 12:19 PM  Result Value Ref Range Status   Specimen Description ABSCESS  Final   Special Requests Normal  Final   Gram Stain   Final    ABUNDANT WBC PRESENT,BOTH PMN AND MONONUCLEAR ABUNDANT GRAM VARIABLE ROD FEW GRAM POSITIVE COCCI IN PAIRS IN CHAINS    Culture (A)  Final    MULTIPLE ORGANISMS PRESENT, NONE PREDOMINANT NO ANAEROBES ISOLATED Performed at [facility], [place] [address] [place], [place] [address]    Report Status 10/09/2017 FINAL  Final  Culture, blood (Routine X 2) w Reflex to ID Panel     Status: None   Collection Time: 10/06/17  9:42 AM  Result Value Ref Range Status   Specimen Description BLOOD RIGHT ANTECUBITAL  Final   Special Requests   Final    BOTTLES DRAWN AEROBIC  AND ANAEROBIC Blood Culture results may not be optimal due to an excessive volume of blood received in culture bottles   Culture   Final     NO GROWTH 5 DAYS Performed at [place] [address], [place], [place] [address]    Report Status 10/11/2017 FINAL  Final  Culture, blood (Routine X 2) w Reflex to ID Panel     Status: None   Collection Time: 10/06/17  9:50 AM  Result Value Ref Range Status   Specimen Description BLOOD LEFT ANTECUBITAL  Final   Special Requests IN PEDIATRIC BOTTLE Blood Culture adequate volume  Final   Culture   Final    NO GROWTH 5 DAYS Performed at [facility], [place] [address] [place], [place] [address]    Report Status 10/11/2017 FINAL  Final  Urine Culture     Status: Abnormal   Collection Time: 10/08/17  4:32 PM  Result Value Ref Range Status   Specimen Description KIDNEY LEFT  Final   Special Requests Normal  Final   Culture 50,000 COLONIES/mL CANDIDA ALBICANS (A)  Final   Report Status 10/10/2017 FINAL  Final  Body fluid culture     Status: None   Collection Time: 10/08/17  6:16 PM  Result Value Ref Range Status   Specimen Description ABDOMEN  Final   Special Requests Normal  Final   Gram Stain   Final    MODERATE WBC PRESENT, PREDOMINANTLY PMN FEW YEAST WITH PSEUDOHYPHAE Performed at [facility], [place] [address] [place], [place] [address]    Culture   Final    MODERATE CANDIDA ALBICANS FEW KLEBSIELLA PNEUMONIAE    Report Status 10/12/2017 FINAL  Final   Organism ID, Bacteria KLEBSIELLA PNEUMONIAE  Final      Susceptibility   Klebsiella pneumoniae - MIC*    AMPICILLIN >=32 RESISTANT Resistant     CEFAZOLIN <=4 SENSITIVE Sensitive     CEFEPIME <=1 SENSITIVE Sensitive     CEFTAZIDIME <=1 SENSITIVE Sensitive     CEFTRIAXONE <=1 SENSITIVE Sensitive     CIPROFLOXACIN <=0.25 SENSITIVE Sensitive     GENTAMICIN <=1 SENSITIVE Sensitive     IMIPENEM <=0.25 SENSITIVE Sensitive     TRIMETH/SULFA <=20 SENSITIVE Sensitive     AMPICILLIN/SULBACTAM >=32 RESISTANT Resistant     PIP/TAZO 16 SENSITIVE Sensitive     Extended ESBL NEGATIVE Sensitive     * FEW KLEBSIELLA  PNEUMONIAE         Radiology Studies: No results found.      Scheduled Meds: . enoxaparin (LOVENOX) injection  40 mg Subcutaneous Q24H  . pantoprazole  40 mg Oral Daily  . polyethylene glycol  17 g Oral Daily  . protein supplement shake  11 oz Oral BID BM  . senna-docusate  2 tablet Oral BID  . sodium chloride flush  5 mL Other Q8H   Continuous Infusions: . anidulafungin Stopped (10/12/17 1632)  . cefTRIAXone (ROCEPHIN)  IV Stopped (10/12/17 1448)     LOS: 9 days    Time spent: 35 minutes    Irine Seal, MD [facility] Pager [phone]  If 7PM-7AM, please contact night-coverage [email or web address] Password [email or web address] 10/13/2017, 1:21 PM

## 2017-10-14 LAB — BASIC METABOLIC PANEL
ANION GAP: 7 (ref 5–15)
BUN: 8 mg/dL (ref 6–20)
CALCIUM: 8.3 mg/dL — AB (ref 8.9–10.3)
CO2: 25 mmol/L (ref 22–32)
CREATININE: 0.68 mg/dL (ref 0.61–1.24)
Chloride: 106 mmol/L (ref 101–111)
GFR calc Af Amer: >60 mL/min (ref >60)
Glucose, Bld: 102 mg/dL — ABNORMAL HIGH (ref 65–99)
Potassium: 3.9 mmol/L (ref 3.5–5.1)
Sodium: 138 mmol/L (ref 135–145)

## 2017-10-14 LAB — CBC
HCT: 32.2 % — ABNORMAL LOW (ref 39.0–52.0)
HEMOGLOBIN: 10.2 g/dL — AB (ref 13.0–17.0)
MCH: 24.6 pg — ABNORMAL LOW (ref 26.0–34.0)
MCHC: 31.7 g/dL (ref 30.0–36.0)
MCV: 77.8 fL — ABNORMAL LOW (ref 78.0–100.0)
PLATELETS: 283 10*3/uL (ref 150–400)
RBC: 4.14 MIL/uL — ABNORMAL LOW (ref 4.22–5.81)
RDW: 15.7 % — AB (ref 11.5–15.5)
WBC: 11.6 10*3/uL — ABNORMAL HIGH (ref 4.0–10.5)

## 2017-10-14 LAB — MAGNESIUM: MAGNESIUM: 2 mg/dL (ref 1.7–2.4)

## 2017-10-14 MED ORDER — PREMIER PROTEIN SHAKE: 11.0000 [oz_av] | Freq: Two times a day (BID) | ORAL | 0 refills | Status: DC

## 2017-10-14 MED ORDER — CEFTRIAXONE IV (FOR PTA / DISCHARGE USE ONLY): 2.0000 g | INTRAVENOUS | 0 refills | Status: AC

## 2017-10-14 MED ORDER — POLYETHYLENE GLYCOL 3350 17 G PO PACK: 17.0000 g | PACK | Freq: Every day | ORAL | 0 refills | Status: DC

## 2017-10-14 MED ORDER — CASPOFUNGIN IV (FOR PTA / DISCHARGE USE ONLY): 50.0000 mg | INTRAVENOUS | 0 refills | Status: AC

## 2017-10-14 MED ORDER — SENNOSIDES-DOCUSATE SODIUM 8.6-50 MG PO TABS: 2.0000 | ORAL_TABLET | Freq: Two times a day (BID) | ORAL | Status: DC

## 2017-10-14 MED ORDER — HEPARIN SOD (PORK) LOCK FLUSH 100 UNIT/ML IV SOLN
250.0000 [IU] | INTRAVENOUS | Status: AC | PRN
  Administered 2017-10-14: 250 [IU]

## 2017-10-14 MED ORDER — OXYCODONE-ACETAMINOPHEN 5-325 MG PO TABS: 1.0000 | ORAL_TABLET | ORAL | 0 refills | Status: DC | PRN

## 2017-10-14 MED ORDER — ONDANSETRON HCL 4 MG PO TABS: 4.0000 mg | ORAL_TABLET | Freq: Four times a day (QID) | ORAL | 0 refills | Status: DC | PRN

## 2017-10-14 NOTE — Discharge Summary (Signed)
Physician Discharge Summary  Kristopher Thompson, DDS NAT:[id number] DOB: 1951-03-16 DOA: 10/04/2017  PCP: Lorene Dy, MD  Admit date: 10/04/2017 Discharge date: 10/14/2017  Time spent: 60 minutes  Recommendations for Outpatient Follow-up:  1. Follow-up with Lorene Dy, MD in 2 weeks. 2. Follow-up with Dr. Sandi Mariscal, interventional radiology in 2 weeks. 3. Follow-up with Dr. Leighton Ruff in 1-2 weeks.   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  Diet recommendation: Regular  Filed Weights   10/11/17 2234 10/13/17 0614 10/14/17 0532  Weight: 116.7 kg (257 lb 4.4 oz) 114 kg (251 lb 5.2 oz) 111 kg (244 lb 11.4 oz)    History of present illness:  Per Dr.Lama Clayborn Milnes  is a 66 y.o. male,.. With history of prostate cancer, bladder cancer status post radical cystectomy and ileal conduit urinary diversion on 03/08/622, complicated by rectal injury which was repaired by general surgery status post diverting colostomy came to hospital with complaints of abdominal pain for past 2 days. At [facility] patient was found to have WBC 28,000, acute kidney injury with creatinine of 1.68, abnormal UA.  CT abdomen/pelvis showed bilateral moderate hydronephrosis, inflammatory process per radiologist and fluid accumulation 5x6 centimeters in the insertion site of continued concerning for abscess.  Urology Dr. Junious Silk was consulted by ED physician. Patient was transferred to [facility].  Denied nausea vomiting diarrhea. 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 Status post ureteral stent removal 1120, status post percutaneous catheter drainage of urinoma 1123 with continued clear urine. Patient was pancultured blood cultures from 10/04/2017 positive for Klebsiella pneumonia. Repeat blood cultures from 10/06/2017 with no growth to date. Anaerobic and aerobic cultures with abundant WBCs, multiple organisms present none predominant no anaerobes isolated. Body fluid culture from 10/08/2017 with moderate WBC, few yeast with pseudohyphae, moderate Candida albicans, few Klebsiella pneumonia. Patient fever curve trended down after the addition of antifungal.  Urine cultures from 10/08/2017 with 50,000 colonies of Candida albicans.  Patient maintained on empiric IV Eraxis.  Changed IV Zosyn to IV Rocephin in discussions with ID as patient unable to be placed on Azole antifungal agents due to his QTC prolongation.  Patient will be treated with IV antifungal and IV antibiotics through December 14.  s/p PICC line.  ID followed.   #2. 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  Patient followed however patient continued to have fever spikes and as such ID was consulted.  Patient was seen in consultation by ID and while they are awaiting final sensitivities patient was started empirically on IV antifungal medication as initial cultures from urinoma showed pseudohyphae  while sensitivities were  pending.  Fever curve trended down after patient being started on antifungal 10/09/2017.  Patient subsequently remained afebrile.  Sensitivities came back positive for Candida albicans and urinoma.  Repeat blood cultures from 10/06/2017 with no growth to date.  Sensitivities have resulted and resistant to ampicillin and ampicillin/sulbactam however sensitive to Zosyn.  IV vancomycin was discontinued 10/05/2017.  IV Zosyn has been discontinued and changed to IV Rocephin after discussions with ID.  Patient will also be continued on IV eraxis due to patient's history of QT prolongation.  Patient will need IV antibiotics through December 14 as recommended per ID.  PICC line placed.  Patient will be discharged on home IV antibiotics.  Outpatient follow-up.   #3 hypokalemia/hypophosphatemia Repleted.  #4 anemia of chronic disease H&H stable.  #5 delayed urine leak status post radical cystectomy and ileal conduit 08/08/2017 Patient being followed by urology who recommended continuing abdominal drain and nephrostomy drainage with monitoring of outputs.  Outpatient follow-up with urology and interventional radiology.  Patient will follow up with Dr. Marcello Moores.    6 chronic QTC prolongation Patient's electrolytes were repleted.  Magnesium Greater than 2.  Potassium get greater than 4.  Repeat EKG done showed QTC prolongation.  Patient remained asymptomatic.  Outpatient follow-up.      Procedures:  CT abdomen and pelvis 10/06/2017, 10/04/2017  Chest x-ray 10/09/2017  CT-guided percutaneous catheter drainage of urinoma anterior to ileal conduit per Dr. Kathlene Cote 10/04/2017  Ultrasound and fluoroscopy scopic guided placement of left sided 67 French or cutaneous nephrostomy, antegrade nephrostogram demonstrates a distal left ureteral injury with extravasation of contrast communicating with percutaneous drainage catheter per Dr. Pascal Lux 10/07/2017  PICC line placement  10/11/2017      Consultations:  Urology: Dr. Junious Silk 10/04/2017  Interventional radiologist: Dr. Kathlene Cote 10/04/2017  ID Dr. Tommy Medal 10/09/2017      Discharge Exam: Vitals:   10/14/17 0532 10/14/17 1319  BP: (!) 148/79 (!) 146/78  Pulse: 80 91  Resp: 20 20  Temp: 98.9 F (37.2 C) 98.8 F (37.1 C)  SpO2: 95% 97%    General: NAD Cardiovascular: RRR Respiratory: CTAB  Discharge Instructions   Discharge Instructions    Diet general   Complete by:  As directed    Home infusion instructions [facility] May follow [place] Dosing Protocol; May administer Cathflo as needed to maintain patency of vascular access device.; Flushing of vascular access device: per [facility] Protocol: 0.9% NaCl pre/post medica...   Complete by:  As directed    Instructions:  May follow [place] Dosing Protocol   Instructions:  May administer Cathflo as needed to maintain patency of vascular access device.   Instructions:  Flushing of vascular access device: per [facility] Protocol: 0.9% NaCl pre/post medication administration and prn patency; Heparin 100 u/ml, 64m for implanted ports and Heparin 10u/ml, 551mfor all other central venous catheters.   Instructions:  May follow [facility] Anaphylaxis Protocol for First Dose Administration in the home: 0.9% NaCl at 25-50 ml/hr to maintain IV access for protocol meds. Epinephrine 0.3 ml IV/IM PRN and Benadryl 25-50 IV/IM PRN s/s of anaphylaxis.   Instructions:  [facility] Coordinator (RN) to assist per patient IV care needs in the home PRN.   Increase activity slowly   Complete by:  As directed      Allergies as of 10/14/2017      Reactions   Demerol [meperidine] Other (See Comments)   SEVERE NAUSEA  Medication List    STOP taking these medications   HYDROcodone-acetaminophen 5-325 MG tablet Commonly known as:  NORCO/VICODIN     TAKE these medications   acetaminophen 500 MG tablet Commonly known as:  TYLENOL Take 1,000  mg by mouth daily as needed for mild pain. For pain   caspofungin IVPB Commonly known as:  CANCIDAS Inject 50 mg into the vein daily for 11 days. Indication:  Infected urionma/abscess growing Candida albicans Last Day of Therapy:  10/25/2017 Labs - Once weekly:  CBC/D and BMP, Labs - Every other week:  ESR and CRP   cefTRIAXone IVPB Commonly known as:  ROCEPHIN Inject 2 g into the vein daily for 11 days. Indication:  Klebsiella pneumoniae bacteremia Last Day of Therapy:  10/25/2017 Labs - Once weekly:  CBC/D and BMP, Labs - Every other week:  ESR and CRP   docusate sodium 100 MG capsule Commonly known as:  COLACE Take 2 capsules (200 mg total) by mouth 2 (two) times daily.   esomeprazole 20 MG capsule Commonly known as:  NEXIUM Take 20 mg by mouth daily as needed (heartburn).   ondansetron 4 MG tablet Commonly known as:  ZOFRAN Take 1 tablet (4 mg total) by mouth every 6 (six) hours as needed for nausea.   oxyCODONE-acetaminophen 5-325 MG tablet Commonly known as:  PERCOCET/ROXICET Take 1-2 tablets by mouth every 4 (four) hours as needed for moderate pain or severe pain ((for moderate pain, give if Tylenol not sufficient)).   polyethylene glycol packet Commonly known as:  MIRALAX / GLYCOLAX Take 17 g by mouth daily. Start taking on:  10/15/2017   protein supplement shake Liqd Commonly known as:  PREMIER PROTEIN Take 325 mLs (11 oz total) by mouth 2 (two) times daily between meals. Start taking on:  10/15/2017   senna-docusate 8.6-50 MG tablet Commonly known as:  Senokot-S Take 2 tablets by mouth 2 (two) times daily.            Home Infusion Instuctions  (From admission, onward)        Start     Ordered   10/14/17 0000  Home infusion instructions [facility] May follow [place] Dosing Protocol; May administer Cathflo as needed to maintain patency of vascular access device.; Flushing of vascular access device: per [facility] Protocol: 0.9% NaCl pre/post  medica...    Question Answer Comment  Instructions May follow [place] Dosing Protocol   Instructions May administer Cathflo as needed to maintain patency of vascular access device.   Instructions Flushing of vascular access device: per [facility] Protocol: 0.9% NaCl pre/post medication administration and prn patency; Heparin 100 u/ml, 87m for implanted ports and Heparin 10u/ml, 547mfor all other central venous catheters.   Instructions May follow [facility] Anaphylaxis Protocol for First Dose Administration in the home: 0.9% NaCl at 25-50 ml/hr to maintain IV access for protocol meds. Epinephrine 0.3 ml IV/IM PRN and Benadryl 25-50 IV/IM PRN s/s of anaphylaxis.   Instructions [facility] Infusion Coordinator (RN) to assist per patient IV care needs in the home PRN.      10/14/17 15[place](From admission, onward)        Start     Ordered   10/09/17 1543  For home use only DME Walker rolling  Once    Question:  Patient needs a walker to treat with the following condition  Answer:  Unsteady gait   10/09/17 1542  Allergies  Allergen Reactions  . Demerol [Meperidine] Other (See Comments)    SEVERE NAUSEA   Follow-up Information    [facility] [place] Follow up.   Why:  [place], physical therapy Contact information: [place] [address] [place] Follow up.   Why:  rolling walker Contact information: [place] [address] [phone]        Sandi Mariscal, MD Follow up in 2 week(s).   Specialties:  Interventional Radiology, Radiology Why:  our office will call you with appointment date and time for follow up procedure Contact information: [address] [place] [address] [phone]        Lorene Dy, MD. Schedule an appointment as soon as possible for a visit in 2 week(s).   Specialty:  Internal Medicine Why:  F/U IN 2-3  WEEKS. Contact information: [place], [place] [address] [phone]        Leighton Ruff, MD. Schedule an appointment as soon as possible for a visit in 2 week(s).   Specialty:  General Surgery Why:  f/u in 1-2 weeks. Contact information: [place] [address] [phone]            The results of significant diagnostics from this hospitalization (including imaging, microbiology, ancillary and laboratory) are listed below for reference.    Significant Diagnostic Studies: Ct Abdomen Pelvis Wo Contrast  Result Date: 10/04/2017 CLINICAL DATA:  History of cystectomy with ileal conduit and urinary diversion. Status post ureteral stent removal with development of abdominal pain, low-grade fever and outside CT demonstrating abnormal fluid collections. EXAM: CT ABDOMEN AND PELVIS WITHOUT CONTRAST TECHNIQUE: Multidetector CT imaging of the abdomen and pelvis was performed following the standard protocol without IV contrast. COMPARISON:  05/24/2017 FINDINGS: Lower chest: Bibasilar atelectasis. Hepatobiliary: No focal liver abnormality is seen. Status post cholecystectomy. No biliary dilatation. Pancreas: Unremarkable. No pancreatic ductal dilatation or surrounding inflammatory changes. Spleen: Normal in size without focal abnormality. Adrenals/Urinary Tract: Excreted contrast material is noted in both collecting systems of the kidneys and ureters. The kidneys do not demonstrate significant hydronephrosis. As the ureters are followed inferiorly, contrast is present in the ileal conduit as well as extravasated contrast immediately anterior to the conduit in the peritoneal cavity. There are also multiple additional high density fluid collections throughout the peritoneal cavity consistent with extravasated urine. At least 10 to 12 additional areas of high attenuation fluid are present. Stomach/Bowel: Bowel shows no evidence of obstruction or ileus. No free air  identified. Ileostomy in the right lower quadrant and colostomy in the left lower quadrant appear unremarkable. Vascular/Lymphatic: No enlarged lymph nodes identified. The abdominal aorta is normal in caliber. Reproductive: The bladder has been removed. The prostate gland also appears likely to have been removed. Other: Small right inguinal hernia contains fat. Musculoskeletal: No acute or significant osseous findings. IMPRESSION: Multiple high attenuation areas of free fluid throughout the peritoneal cavity consistent with extravasated urine. The highest density collection is just anterior to the ileal conduit. Electronically Signed   By: Aletta Edouard M.D.   On: 10/04/2017 08:52   Dg Chest 2 View  Result Date: 10/09/2017 CLINICAL DATA:  Fever, shortness of breath. EXAM: CHEST  2 VIEW COMPARISON:  Radiographs of September 26, 2012. FINDINGS: The heart size and mediastinal contours are within normal limits. Hypoinflation of the lungs is noted with  mild bibasilar subsegmental atelectasis. Minimal bilateral pleural effusions are noted. The visualized skeletal structures are unremarkable. IMPRESSION: Hypoinflation of the lungs with mild bibasilar subsegmental atelectasis and minimal bilateral pleural effusions. Electronically Signed   By: Marijo Conception, M.D.   On: 10/09/2017 13:04   Ct Abdomen Pelvis W Contrast  Result Date: 10/06/2017 CLINICAL DATA:  History of prostate and bladder cancer, status post radical cystectomy and ileal conduit urinary diversion complicated by a rectal injury repaired with diverting colostomy. Abdominal distention. Recent percutaneous catheter drainage of urinoma EXAM: CT ABDOMEN AND PELVIS WITH CONTRAST TECHNIQUE: Multidetector CT imaging of the abdomen and pelvis was performed using the standard protocol following bolus administration of intravenous contrast. CONTRAST:  139m ISOVUE-300 IOPAMIDOL (ISOVUE-300) INJECTION 61% COMPARISON:  09/03/2017 exams FINDINGS: Lower chest:  Atelectasis with trace pleural effusions, stable in appearance. Heart size is top normal with coronary arteriosclerosis and aortic atherosclerosis. Hepatobiliary: Status post cholecystectomy. No biliary dilatation. Mild hepatic steatosis. Pancreas: Atrophic pancreas with scattered pancreatic calcifications that may reflect chronic pancreatitis. No acute inflammation, ductal dilatation or mass. Spleen: Normal size without focal abnormality. Adrenals/Urinary Tract: Normal bilateral adrenal glands. 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 No significant reaccumulation of fluid. The bladder has been resected. Stomach/Bowel: Left lower quadrant colostomy with scattered colonic diverticulosis along the distal descending and sigmoid colon. Moderate stool is noted within the colon leading up to the colostomy in the left hemipelvis. Stable subcutaneous soft tissue induration about the ileal conduit and left lower quadrant colostomy site. Vascular/Lymphatic: Aortic atherosclerosis. No enlarged abdominal or pelvic lymph nodes. Reproductive: Prostate is not visualized and appears to have been resected. Other: Small right-sided inguinal hernia containing fat. No free air. Musculoskeletal: No acute or significant osseous findings. IMPRESSION: 1. Status post percutaneous drainage urinoma in the right lower quadrant without reaccumulation of fluid noted. 2. 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 No bowel obstruction. No acute inflammation. 3. Stable trace pleural effusions and atelectasis at each lung base. 4. Mild hepatic steatosis.  status post cholecystectomy. 5. 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 Electronically Signed   By: DAshley RoyaltyM.D.   On: 10/06/2017 16:59   Ir Nephrostomy Placement Left  Result  Date: 10/07/2017 INDICATION: History of bladder cancer, post radical cystectomy with ileal conduit creation, now with urine leak presumably secondary to injury involving the distal aspect the left ureter. Patient underwent CT-guided drainage of urinoma on 10/06/2017 by Dr. YKathlene Coteand presents now for ultrasound and fluoroscopic guided left-sided nephrostomy catheter placement and potential antegrade nephrostogram for urinary diversion purposes. EXAM: 1. ULTRASOUND GUIDANCE FOR PUNCTURE OF THE LEFT RENAL COLLECTING SYSTEM 2. LEFT PERCUTANEOUS NEPHROSTOMY TUBE PLACEMENT. COMPARISON:  CT abdomen and pelvis - 10/04/2017 ; 10/06/2017; CT-guided percutaneous drainage catheter placement - 10/04/2017 MEDICATIONS: Patient is currently admitted to the hospital and receiving intravenous antibiotics; The antibiotic was administered in an appropriate time frame prior to skin puncture. ANESTHESIA/SEDATION: Moderate (conscious) sedation was employed during this procedure. A total of Versed 5.5 mg and Fentanyl 175 mcg was administered intravenously. Moderate Sedation Time: 30 minutes. The patient's level of consciousness and vital signs were monitored continuously by radiology nursing throughout the procedure under my direct supervision. CONTRAST:  25 mL Isovue 300 administered into the collecting system FLUOROSCOPY TIME:  7 minutes (3885mGy) COMPLICATIONS: None immediate. PROCEDURE: The procedure, risks, benefits, and  alternatives were explained to the patient. Questions regarding the procedure were encouraged and answered. The patient understands and consents to the procedure. A timeout was performed prior to the initiation of the procedure. The left flank region was prepped with Betadine in a sterile fashion, and a sterile drape was applied covering the operative field. A sterile gown and sterile gloves were used for the procedure. Local anesthesia was provided with 1% Lidocaine with epinephrine. Ultrasound was used to  localize the left kidney. Under direct ultrasound guidance, a 21 gauge needle was advanced into the renal collecting system. An ultrasound image documentation was performed. Access within the collecting system was confirmed with the efflux of urine followed by contrast injection. With the left renal collecting system apically opacified, a posterior inferior calyx was targeted fluoroscopically with a 22 gauge Chiba needle after the overlying soft tissues were anesthetized with 1% lidocaine with epinephrine. Access to the collecting system was confirmed with advancement of a Nitrex tracks wire into the left renal collecting system to the superior aspect the left ureter. The track was dilated with an Accustick set. Over a short Amplatz wire, a 8 French radiopaque tip vascular sheath was advanced through the collecting systems the superior aspect the left ureter. Next, a Kumpe catheter was utilized to perform a distal left nephrostogram. Attempts were made to traverse the area of extravasation/injury involving the distal aspect the left ureter, however this ultimately proved unsuccessful. Next, under intermittent fluoroscopic guidance, the vascular sheath was exchanged for a 10 French percutaneous nephrostomy catheter with end ultimately coiled and locked within the left renal pelvis. Contrast was injected and several sport radiographs were obtained in various obliquities confirming access. The catheter was secured at the skin with a Prolene retention suture and a gravity bag was placed. A dressing was placed. The patient tolerated procedure well without immediate postprocedural complication. FINDINGS: Ultrasound scanning demonstrates a mildly dilated left-sided renal collecting system, similar preceding abdominal CT. Under direct ultrasound guidance, the left renal collecting system was cannulated however initial access demonstrated a suspected puncture at the level of the infundibulum. As such, a opacified posterior  inferior calyx was targeted fluoroscopically allowing placement of a vascular sheath to the level of the superior aspect of the left ureter. Distal nephrostogram demonstrates extravasation of contrast at the distal aspect of the left ureter regional to the anastomosis. There is extravasation of contrast into the adjacent peritoneum communicating with the end of the percutaneous drainage catheter. Attempts were made to traverse the distal left ureteral anastomotic injury however this ultimately proved unsuccessful. Successful placement of a 10 French percutaneous nephrostomy catheter with end coiled and locked within left renal pelvis. Contrast injection confirmed appropriate positioning. IMPRESSION: 1. Successful ultrasound and fluoroscopic guided placement of a left sided 10 French PCN. 2. Antegrade nephrostogram demonstrates a distal left ureteral injury with extravasation of contrast communicating with the percutaneous drainage catheter. PLAN: Would recommend urinary diversion via external drainage for a minimal of 2 weeks prior to repeated attempted nephroureteral stent placement. Electronically Signed   By: Sandi Mariscal M.D.   On: 10/07/2017 14:55   Ct Image Guided Drainage By Percutaneous Catheter  Result Date: 10/04/2017 CLINICAL DATA:  Status post prior cystoprostatectomy and ileal conduit diversion for bladder carcinoma. 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 EXAM: CT GUIDED CATHETER DRAINAGE OF PERITONEAL URINOMA ANESTHESIA/SEDATION: 2.0 mg IV Versed 100 mcg IV Fentanyl Total Moderate Sedation Time:  22 minutes The patient's level of consciousness and physiologic status were continuously monitored during the procedure by Radiology nursing. PROCEDURE: The procedure, risks, benefits, and alternatives were explained to the patient. Questions regarding the procedure were encouraged and answered. The patient  understands and consents to the procedure. A time out was performed prior to initiating the procedure. The right anterior abdominal wall was prepped with chlorhexidine in a sterile fashion, and a sterile drape was applied covering the operative field. A sterile gown and sterile gloves were used for the procedure. Local anesthesia was provided with 1% Lidocaine. CT was performed through the lower abdomen and upper pelvis in a supine position. 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 Aspiration of fluid was performed through the needle. A guidewire was then advanced into the collection. The tract was dilated and a 10 French percutaneous drainage catheter advanced. The catheter was formed and connected to a gravity drainage bag. A urine sample was sent for culture analysis. The catheter was secured at skin with a Prolene retention suture. COMPLICATIONS: None FINDINGS: The highest density pocket of extravasated urine containing contrast material lying anterior to the ileal conduit was targeted. There was return of turbid urine which was sent for culture analysis. After placement of the 10 French drainage catheter and connection to a gravity drainage bag, there was prompt return of urine. 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 Urine return was turbid and a sample sent for culture analysis. A 10 French drainage catheter was placed and connected to gravity bag drainage. Electronically Signed   By: Aletta Edouard M.D.   On: 10/04/2017 15:06   Dg Outside Films Chest  Result Date: 10/04/2017 This examination belongs to an outside facility and is stored here for comparison purposes only.  Contact the originating outside institution for any associated report or interpretation.  Ct Outside Films Body  Result  Date: 10/04/2017 This examination belongs to an outside facility and is stored here for comparison purposes only.  Contact the originating outside institution for any associated report or interpretation.   Microbiology: Recent Results (from the past 240 hour(s))  Culture, blood (Routine X 2) w Reflex to ID Panel     Status: None   Collection Time: 10/06/17  9:42 AM  Result Value Ref Range Status   Specimen Description BLOOD RIGHT ANTECUBITAL  Final   Special Requests   Final    BOTTLES DRAWN AEROBIC AND ANAEROBIC Blood Culture results may not be optimal due to an excessive volume of blood received in culture bottles   Culture   Final    NO GROWTH 5 DAYS Performed at [facility] [address], [place], [place] [address]    Report Status 10/11/2017 FINAL  Final  Culture, blood (Routine X 2) w Reflex to ID Panel     Status: None   Collection Time: 10/06/17  9:50 AM  Result Value Ref Range Status   Specimen Description BLOOD LEFT ANTECUBITAL  Final   Special Requests IN PEDIATRIC BOTTLE Blood Culture adequate volume  Final   Culture   Final    NO GROWTH 5 DAYS Performed at [facility], [place] [address], [place], [place] [address]    Report Status 10/11/2017 FINAL  Final  Urine Culture     Status: Abnormal   Collection  Time: 10/08/17  4:32 PM  Result Value Ref Range Status   Specimen Description KIDNEY LEFT  Final   Special Requests Normal  Final   Culture 50,000 COLONIES/mL CANDIDA ALBICANS (A)  Final   Report Status 10/10/2017 FINAL  Final  Body fluid culture     Status: None   Collection Time: 10/08/17  6:16 PM  Result Value Ref Range Status   Specimen Description ABDOMEN  Final   Special Requests Normal  Final   Gram Stain   Final    MODERATE WBC PRESENT, PREDOMINANTLY PMN FEW YEAST WITH PSEUDOHYPHAE Performed at [facility], [address], [place], [place] [address]    Culture   Final    MODERATE CANDIDA ALBICANS FEW KLEBSIELLA PNEUMONIAE     Report Status 10/12/2017 FINAL  Final   Organism ID, Bacteria KLEBSIELLA PNEUMONIAE  Final      Susceptibility   Klebsiella pneumoniae - MIC*    AMPICILLIN >=32 RESISTANT Resistant     CEFAZOLIN <=4 SENSITIVE Sensitive     CEFEPIME <=1 SENSITIVE Sensitive     CEFTAZIDIME <=1 SENSITIVE Sensitive     CEFTRIAXONE <=1 SENSITIVE Sensitive     CIPROFLOXACIN <=0.25 SENSITIVE Sensitive     GENTAMICIN <=1 SENSITIVE Sensitive     IMIPENEM <=0.25 SENSITIVE Sensitive     TRIMETH/SULFA <=20 SENSITIVE Sensitive     AMPICILLIN/SULBACTAM >=32 RESISTANT Resistant     PIP/TAZO 16 SENSITIVE Sensitive     Extended ESBL NEGATIVE Sensitive     * FEW KLEBSIELLA PNEUMONIAE     Labs: Basic Metabolic Panel: Recent Labs  Lab 10/09/17 0506 10/09/17 1647 10/10/17 0518 10/11/17 0532 10/12/17 1851 10/13/17 0608 10/14/17 0555  NA 137 137 140 141  --  139 138  K 2.8* 2.8* 3.4* 3.0* 3.6 3.6 3.9  CL 110 109 112* 112*  --  106 106  CO2 '22 23 23 24  ' --  25 25  GLUCOSE 93 109* 138* 103*  --  100* 102*  BUN '12 13 11 9  ' --  8 8  CREATININE 0.87 0.81 0.93 0.75  --  0.59* 0.68  CALCIUM 7.2* 7.3* 7.5* 7.4*  --  8.1* 8.3*  MG 1.8  --  2.3  --   --  2.0 2.0  PHOS  --   --   --   --   --  3.2  --    Liver Function Tests: No results for input(s): AST, ALT, ALKPHOS, BILITOT, PROT, ALBUMIN in the last 168 hours. No results for input(s): LIPASE, AMYLASE in the last 168 hours. No results for input(s): AMMONIA in the last 168 hours. CBC: Recent Labs  Lab 10/09/17 0506 10/10/17 0518 10/11/17 0532 10/13/17 0608 10/14/17 0555  WBC 9.1 10.5 10.0 11.0* 11.6*  NEUTROABS  --   --  7.5  --   --   HGB 9.6* 9.8* 9.4* 9.9* 10.2*  HCT 29.8* 30.0* 29.4* 30.7* 32.2*  MCV 77.4* 77.1* 76.8* 77.5* 77.8*  PLT 229 236 235 259 283   Cardiac Enzymes: No results for input(s): CKTOTAL, CKMB, CKMBINDEX, TROPONINI in the last 168 hours. BNP: BNP (last 3 results) No results for input(s): BNP in the last 8760 hours.  ProBNP  (last 3 results) No results for input(s): PROBNP in the last 8760 hours.  CBG: No results for input(s): GLUCAP in the last 168 hours.     Signed:  Irine Seal MD.  [facility] 10/14/2017, 3:54 PM

## 2017-10-14 NOTE — Care Management Note (Signed)
Case Management Note  Patient Details  Name: DEVONTRE SIEDSCHLAG, DDS MRN: [id number] Date of Birth: 1951/03/11  Subjective/Objective: [facility] already following for iv infusion rep Pam following. [facility] rep Malachy Mood following for HHRN-iv abx instruction/HHPT. [facility] dme rep has already brought home rolling walker to rm. No further CM needs.                  Action/Plan:d/c home w/HHC/dme   Expected Discharge Date:  10/14/17               Expected Discharge Plan:  [place]  In-House Referral:     Discharge planning Services  CM Consult  Post Acute Care Choice:  Home Health(Active w/[facility]-HHRN) Choice offered to:  Patient  DME Arranged:  Walker rolling DME Agency:  [facility] Arranged:  RN, PT [facility] Agency:  [facility] [place]  Status of Service:  Completed, signed off  If discussed at [place] of Stay Meetings, dates discussed:    Additional Comments:  Dessa Phi, RN 10/14/2017, 3:36 PM

## 2017-10-14 NOTE — Progress Notes (Signed)
Dressings changed x 3 midline incision, abdominal drain and nephrostomy.

## 2017-10-14 NOTE — Progress Notes (Signed)
PHARMACY CONSULT NOTE FOR:  OUTPATIENT  PARENTERAL ANTIBIOTIC THERAPY (OPAT)  Indication: Klebsiella pneumonia bacteremia, Infected urionma/abscess growing Candida albicans Regimen: Caspofungin 50 mg IV q24h and Ceftriaxone 2g IV q24h End date: 10/25/2017   Substitute Caspofungin 50 mg IV q24h for Eraxis at discharge d/t preferred formulary echinocandin at [facility].  Duration extended to 12/14 per urology due to procedure scheduled on 12/13.  IV antibiotic discharge orders are pended. To discharging provider:  please sign these orders via discharge navigator,  Select New Orders & click on the button choice - Manage This Unsigned Work.     Thank you for allowing pharmacy to be a part of this patient's care.  Gretta Arab PharmD, BCPS Pager [phone] 10/14/2017 11:21 AM

## 2017-10-14 NOTE — Care Management Important Message (Signed)
Important Message  Patient Details  Name: Kristopher Thompson, Kristopher Thompson MRN: [id number] Date of Birth: 20-Sep-1951   Medicare Important Message Given:  Yes    Kerin Salen 10/14/2017, 11:47 AMImportant Message  Patient Details  Name: Kristopher Thompson, Kristopher Thompson MRN: [id number] Date of Birth: 1951-09-11   Medicare Important Message Given:  Yes    Kerin Salen 10/14/2017, 11:47 AM

## 2017-10-14 NOTE — Progress Notes (Signed)
    Referring Physician(s): Borden,L  Supervising Physician: Sandi Mariscal  Patient Status:  [facility] - In-pt  Chief Complaint:  Post op urine leak  Subjective: Pt doing ok; no new c/o   Allergies: Demerol [meperidine]  Medications: Prior to Admission medications   Medication Sig Start Date End Date Taking? Authorizing Provider  acetaminophen (TYLENOL) 500 MG tablet Take 1,000 mg by mouth daily as needed for mild pain. For pain   Yes [provider]  docusate sodium (COLACE) 100 MG capsule Take 2 capsules (200 mg total) by mouth 2 (two) times daily. 08/14/17 08/14/18 Yes McCormickLenna Sciara, MD  esomeprazole (NEXIUM) 20 MG capsule Take 20 mg by mouth daily as needed (heartburn).   Yes [provider]  HYDROcodone-acetaminophen (NORCO/VICODIN) 5-325 MG tablet Take 1-2 tablets by mouth every 6 (six) hours as needed for moderate pain (pain). 08/14/17  Yes Stasia Cavalier, MD     Vital Signs: BP (!) 146/78 (BP Location: Left Arm)   Pulse 91   Temp 98.8 F (37.1 C) (Oral)   Resp 20   Ht 6' (1.829 m)   Wt 244 lb 11.4 oz (111 kg)   SpO2 97%   BMI 33.19 kg/m   Physical Exam: rt abd drain intact, insertion site ok, output 25 cc yellow fluid/urine; left PCN intact, output 600 cc yellow urine  Imaging: No results found.  Labs:  CBC: Recent Labs    10/10/17 0518 10/11/17 0532 10/13/17 0608 10/14/17 0555  WBC 10.5 10.0 11.0* 11.6*  HGB 9.8* 9.4* 9.9* 10.2*  HCT 30.0* 29.4* 30.7* 32.2*  PLT 236 235 259 283    COAGS: Recent Labs    10/07/17 0947  INR 1.18    BMP: Recent Labs    10/10/17 0518 10/11/17 0532 10/12/17 1851 10/13/17 0608 10/14/17 0555  NA 140 141  --  139 138  K 3.4* 3.0* 3.6 3.6 3.9  CL 112* 112*  --  106 106  CO2 23 24  --  25 25  GLUCOSE 138* 103*  --  100* 102*  BUN 11 9  --  8 8  CALCIUM 7.5* 7.4*  --  8.1* 8.3*  CREATININE 0.93 0.75  --  0.59* 0.68  GFRNONAA >60 >60  --  >60 >60  GFRAA >60 >60  --  >60 >60     LIVER FUNCTION TESTS: Recent Labs    10/04/17 0522 10/05/17 0602 10/07/17 0456  BILITOT 0.7 0.8 0.6  AST 28 16 14*  ALT 31 21 15*  ALKPHOS 67 61 64  PROT 6.5 5.6* 5.9*  ALBUMIN 2.7* 2.1* 2.3*    Assessment and Plan: Bladder/prostate cancer, s/p cystoprostatectomy/ileal conduit, 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 Call [phone] or [phone] with any drain questions.        Electronically Signed: D. Rowe Robert, PA-C 10/14/2017, 3:11 PM   I spent a total of 15 minutes at the the patient's bedside AND on the patient's hospital floor or unit, greater than 50% of which was counseling/coordinating care for right abdominal drain, left nephrostomy    Patient ID: Kristopher Thompson, DDS, male   DOB: May 12, 1951, 66 y.o.   MRN: [id number]

## 2017-10-14 NOTE — Progress Notes (Signed)
PT Cancellation Note  Patient Details Name: Kristopher Thompson, DDS MRN: [id number] DOB: 16-Jul-1951   Cancelled Treatment:    Reason Eval/Treat Not Completed: Other (comment)(PT signing off, pt is independently walking in halls with RW. )   Philomena Doheny 10/14/2017, 1:12 PM [phone]

## 2017-10-14 NOTE — Progress Notes (Signed)
Patient ID: Kristopher Thompson, DDS, male   DOB: 11/09/1951, 66 y.o.   MRN: [id number]    Subjective: Pt continued to do well over the weekend.  Improved po intake.  Abdominal drain with decreasing output and decreasing Cr level.  On ceftriaxone and anidulafungin.  Has PICC line.  Objective: Vital signs in last 24 hours: Temp:  [98.3 F (36.8 C)-98.9 F (37.2 C)] 98.9 F (37.2 C) (12/03 0532) Pulse Rate:  [76-83] 80 (12/03 0532) Resp:  [20] 20 (12/03 0532) BP: (138-149)/(76-82) 148/79 (12/03 0532) SpO2:  [95 %-98 %] 95 % (12/03 0532) Weight:  [111 kg (244 lb 11.4 oz)] 111 kg (244 lb 11.4 oz) (12/03 0532)  Intake/Output from previous day: 12/02 0701 - 12/03 0700 In: 220 [P.O.:220] Out: 4200 [Urine:4175; Drains:25] Intake/Output this shift: No intake/output data recorded.  Physical Exam:  General: Alert and oriented   Lab Results: Recent Labs    10/13/17 0608 10/14/17 0555  HGB 9.9* 10.2*  HCT 30.7* 32.2*   CBC Latest Ref Rng & Units 10/14/2017 10/13/2017 10/11/2017  WBC 4.0 - 10.5 K/uL 11.6(H) 11.0(H) 10.0  Hemoglobin 13.0 - 17.0 g/dL 10.2(L) 9.9(L) 9.4(L)  Hematocrit 39.0 - 52.0 % 32.2(L) 30.7(L) 29.4(L)  Platelets 150 - 400 K/uL 283 259 235     BMET Recent Labs    10/13/17 0608 10/14/17 0555  NA 139 138  K 3.6 3.9  CL 106 106  CO2 25 25  GLUCOSE 100* 102*  BUN 8 8  CREATININE 0.59* 0.68  CALCIUM 8.1* 8.3*     Studies/Results: Cultures from abdominal drain final and consistent with prior results.  Assessment/Plan: Delayed urine leak s/p radical cystectomy and ileal conduit in September.  1) ID: Has PICC line and will be discharged home on IV ceftriaxone and anidulafungin.  My only recommendation would be to continue therapy through 12/14 (rather than 12/12) so this will provide ongoing coverage during and after his IR procedure that is currently scheduled for 12/13.  2) Urine leak: Adequately managed with abdominal drain and left nephrostomy.  Leave  abdominal drain until the day of IR procedure at which time drain can be removed if leak is confirmed to have resolved.  Pt and wife have been taught how to measure and record outputs and feel comfortable with this.  3) Disp: Ok for discharge from a urologic standpoint.  He is doing better with po intake and ambulation.  He has follow up scheduled with me for early January and I will communicate with IR at time of his upcoming procedure.  He will call to reschedule his missed appointment with Dr. Marcello Moores.   LOS: 10 days   Reeya Bound,LES 10/14/2017, 7:24 AM

## 2017-10-15 DIAGNOSIS — N9989 Other postprocedural complications and disorders of genitourinary system: Secondary | ICD-10-CM | POA: Diagnosis not present

## 2017-10-15 DIAGNOSIS — B379 Candidiasis, unspecified: Secondary | ICD-10-CM | POA: Diagnosis not present

## 2017-10-15 DIAGNOSIS — T8143XA Infection following a procedure, organ and space surgical site, initial encounter: Secondary | ICD-10-CM | POA: Diagnosis not present

## 2017-10-15 DIAGNOSIS — C679 Malignant neoplasm of bladder, unspecified: Secondary | ICD-10-CM | POA: Diagnosis not present

## 2017-10-15 DIAGNOSIS — C61 Malignant neoplasm of prostate: Secondary | ICD-10-CM | POA: Diagnosis not present

## 2017-10-15 DIAGNOSIS — T8144XA Sepsis following a procedure, initial encounter: Secondary | ICD-10-CM | POA: Diagnosis not present

## 2017-10-15 DIAGNOSIS — Z792 Long term (current) use of antibiotics: Secondary | ICD-10-CM | POA: Diagnosis not present

## 2017-10-15 DIAGNOSIS — B961 Klebsiella pneumoniae [K. pneumoniae] as the cause of diseases classified elsewhere: Secondary | ICD-10-CM | POA: Diagnosis not present

## 2017-10-15 DIAGNOSIS — Z452 Encounter for adjustment and management of vascular access device: Secondary | ICD-10-CM | POA: Diagnosis not present

## 2017-10-15 DIAGNOSIS — Z936 Other artificial openings of urinary tract status: Secondary | ICD-10-CM | POA: Diagnosis not present

## 2017-10-15 DIAGNOSIS — Z87891 Personal history of nicotine dependence: Secondary | ICD-10-CM | POA: Diagnosis not present

## 2017-10-15 DIAGNOSIS — Z933 Colostomy status: Secondary | ICD-10-CM | POA: Diagnosis not present

## 2017-10-16 DIAGNOSIS — T8144XA Sepsis following a procedure, initial encounter: Secondary | ICD-10-CM | POA: Diagnosis not present

## 2017-10-16 DIAGNOSIS — C61 Malignant neoplasm of prostate: Secondary | ICD-10-CM | POA: Diagnosis not present

## 2017-10-16 DIAGNOSIS — T8143XA Infection following a procedure, organ and space surgical site, initial encounter: Secondary | ICD-10-CM | POA: Diagnosis not present

## 2017-10-16 DIAGNOSIS — Z452 Encounter for adjustment and management of vascular access device: Secondary | ICD-10-CM | POA: Diagnosis not present

## 2017-10-16 DIAGNOSIS — C679 Malignant neoplasm of bladder, unspecified: Secondary | ICD-10-CM | POA: Diagnosis not present

## 2017-10-16 DIAGNOSIS — B961 Klebsiella pneumoniae [K. pneumoniae] as the cause of diseases classified elsewhere: Secondary | ICD-10-CM | POA: Diagnosis not present

## 2017-10-17 ENCOUNTER — Other Ambulatory Visit: Payer: Self-pay | Admitting: Radiation Oncology

## 2017-10-18 DIAGNOSIS — Z452 Encounter for adjustment and management of vascular access device: Secondary | ICD-10-CM | POA: Diagnosis not present

## 2017-10-18 DIAGNOSIS — C61 Malignant neoplasm of prostate: Secondary | ICD-10-CM | POA: Diagnosis not present

## 2017-10-18 DIAGNOSIS — T8143XA Infection following a procedure, organ and space surgical site, initial encounter: Secondary | ICD-10-CM | POA: Diagnosis not present

## 2017-10-18 DIAGNOSIS — B961 Klebsiella pneumoniae [K. pneumoniae] as the cause of diseases classified elsewhere: Secondary | ICD-10-CM | POA: Diagnosis not present

## 2017-10-18 DIAGNOSIS — T8144XA Sepsis following a procedure, initial encounter: Secondary | ICD-10-CM | POA: Diagnosis not present

## 2017-10-18 DIAGNOSIS — C679 Malignant neoplasm of bladder, unspecified: Secondary | ICD-10-CM | POA: Diagnosis not present

## 2017-10-21 ENCOUNTER — Other Ambulatory Visit: Payer: Self-pay | Admitting: Radiology

## 2017-10-22 DIAGNOSIS — B961 Klebsiella pneumoniae [K. pneumoniae] as the cause of diseases classified elsewhere: Secondary | ICD-10-CM | POA: Diagnosis not present

## 2017-10-22 DIAGNOSIS — C61 Malignant neoplasm of prostate: Secondary | ICD-10-CM | POA: Diagnosis not present

## 2017-10-22 DIAGNOSIS — C679 Malignant neoplasm of bladder, unspecified: Secondary | ICD-10-CM | POA: Diagnosis not present

## 2017-10-22 DIAGNOSIS — A419 Sepsis, unspecified organism: Secondary | ICD-10-CM | POA: Diagnosis not present

## 2017-10-22 DIAGNOSIS — T8144XA Sepsis following a procedure, initial encounter: Secondary | ICD-10-CM | POA: Diagnosis not present

## 2017-10-22 DIAGNOSIS — Z452 Encounter for adjustment and management of vascular access device: Secondary | ICD-10-CM | POA: Diagnosis not present

## 2017-10-22 DIAGNOSIS — T8143XA Infection following a procedure, organ and space surgical site, initial encounter: Secondary | ICD-10-CM | POA: Diagnosis not present

## 2017-10-23 DIAGNOSIS — T8144XA Sepsis following a procedure, initial encounter: Secondary | ICD-10-CM | POA: Diagnosis not present

## 2017-10-23 DIAGNOSIS — T8143XA Infection following a procedure, organ and space surgical site, initial encounter: Secondary | ICD-10-CM | POA: Diagnosis not present

## 2017-10-23 DIAGNOSIS — B961 Klebsiella pneumoniae [K. pneumoniae] as the cause of diseases classified elsewhere: Secondary | ICD-10-CM | POA: Diagnosis not present

## 2017-10-23 DIAGNOSIS — Z452 Encounter for adjustment and management of vascular access device: Secondary | ICD-10-CM | POA: Diagnosis not present

## 2017-10-23 DIAGNOSIS — C61 Malignant neoplasm of prostate: Secondary | ICD-10-CM | POA: Diagnosis not present

## 2017-10-23 DIAGNOSIS — C679 Malignant neoplasm of bladder, unspecified: Secondary | ICD-10-CM | POA: Diagnosis not present

## 2017-10-24 ENCOUNTER — Ambulatory Visit (HOSPITAL_COMMUNITY)
Admission: RE | Admit: 2017-10-24 | Discharge: 2017-10-24 | Disposition: A | Discharge status: Home / Self Care | Payer: Medicare Other | Source: Ambulatory Visit | Attending: Radiology | Admitting: Radiology

## 2017-10-24 ENCOUNTER — Other Ambulatory Visit (HOSPITAL_COMMUNITY): Payer: Self-pay | Admitting: Radiology

## 2017-10-24 ENCOUNTER — Ambulatory Visit (HOSPITAL_COMMUNITY)
Admission: RE | Admit: 2017-10-24 | Discharge: 2017-10-24 | Disposition: A | Discharge status: Home / Self Care | Payer: Medicare Other | Source: Ambulatory Visit | Attending: Urology | Admitting: Urology

## 2017-10-24 ENCOUNTER — Encounter (HOSPITAL_COMMUNITY): Payer: Self-pay

## 2017-10-24 DIAGNOSIS — N9989 Other postprocedural complications and disorders of genitourinary system: Secondary | ICD-10-CM | POA: Insufficient documentation

## 2017-10-24 DIAGNOSIS — K219 Gastro-esophageal reflux disease without esophagitis: Secondary | ICD-10-CM | POA: Diagnosis not present

## 2017-10-24 DIAGNOSIS — Z8546 Personal history of malignant neoplasm of prostate: Secondary | ICD-10-CM | POA: Diagnosis not present

## 2017-10-24 DIAGNOSIS — N135 Crossing vessel and stricture of ureter without hydronephrosis: Secondary | ICD-10-CM | POA: Diagnosis not present

## 2017-10-24 DIAGNOSIS — Y838 Other surgical procedures as the cause of abnormal reaction of the patient, or of later complication, without mention of misadventure at the time of the procedure: Secondary | ICD-10-CM | POA: Insufficient documentation

## 2017-10-24 DIAGNOSIS — N9981 Other intraoperative complications of genitourinary system: Secondary | ICD-10-CM | POA: Diagnosis not present

## 2017-10-24 DIAGNOSIS — N99528 Other complication of other external stoma of urinary tract: Secondary | ICD-10-CM | POA: Diagnosis not present

## 2017-10-24 DIAGNOSIS — Z933 Colostomy status: Secondary | ICD-10-CM | POA: Insufficient documentation

## 2017-10-24 DIAGNOSIS — Z79899 Other long term (current) drug therapy: Secondary | ICD-10-CM | POA: Diagnosis not present

## 2017-10-24 DIAGNOSIS — Z8551 Personal history of malignant neoplasm of bladder: Secondary | ICD-10-CM | POA: Diagnosis not present

## 2017-10-24 HISTORY — PX: IR NEPHRO TUBE REMOV/FL: IMG2342

## 2017-10-24 HISTORY — PX: IR CONVERT LEFT NEPHROSTOMY TO NEPHROURETERAL CATH: IMG6067

## 2017-10-24 LAB — CBC
HEMATOCRIT: 37.1 % — AB (ref 39.0–52.0)
Hemoglobin: 11.7 g/dL — ABNORMAL LOW (ref 13.0–17.0)
MCH: 24.8 pg — ABNORMAL LOW (ref 26.0–34.0)
MCHC: 31.5 g/dL (ref 30.0–36.0)
MCV: 78.8 fL (ref 78.0–100.0)
Platelets: 226 10*3/uL (ref 150–400)
RBC: 4.71 MIL/uL (ref 4.22–5.81)
RDW: 16.5 % — AB (ref 11.5–15.5)
WBC: 7.3 10*3/uL (ref 4.0–10.5)

## 2017-10-24 LAB — PROTIME-INR
INR: 1.03
Prothrombin Time: 13.4 seconds (ref 11.4–15.2)

## 2017-10-24 LAB — BASIC METABOLIC PANEL
Anion gap: 9 (ref 5–15)
BUN: 14 mg/dL (ref 6–20)
CALCIUM: 9.3 mg/dL (ref 8.9–10.3)
CO2: 24 mmol/L (ref 22–32)
Chloride: 106 mmol/L (ref 101–111)
Creatinine, Ser: 0.82 mg/dL (ref 0.61–1.24)
GFR calc Af Amer: >60 mL/min (ref >60)
GLUCOSE: 97 mg/dL (ref 65–99)
POTASSIUM: 3.9 mmol/L (ref 3.5–5.1)
Sodium: 139 mmol/L (ref 135–145)

## 2017-10-24 LAB — APTT: aPTT: 30 seconds (ref 24–36)

## 2017-10-24 MED ORDER — CIPROFLOXACIN IN D5W 400 MG/200ML IV SOLN
INTRAVENOUS | Status: AC
  Filled 2017-10-24: qty 200

## 2017-10-24 MED ORDER — SODIUM CHLORIDE 0.9 % IV SOLN
INTRAVENOUS | Status: DC
  Administered 2017-10-24: 10:00:00 via INTRAVENOUS

## 2017-10-24 MED ORDER — FENTANYL CITRATE (PF) 100 MCG/2ML IJ SOLN
INTRAMUSCULAR | Status: AC | PRN
  Administered 2017-10-24: 25 ug via INTRAVENOUS
  Administered 2017-10-24: 50 ug via INTRAVENOUS
  Administered 2017-10-24: 25 ug via INTRAVENOUS
  Administered 2017-10-24: 50 ug via INTRAVENOUS

## 2017-10-24 MED ORDER — HEPARIN SOD (PORK) LOCK FLUSH 100 UNIT/ML IV SOLN
250.0000 [IU] | INTRAVENOUS | Status: AC | PRN
  Administered 2017-10-24: 250 [IU]
  Filled 2017-10-24: qty 5

## 2017-10-24 MED ORDER — FENTANYL CITRATE (PF) 100 MCG/2ML IJ SOLN
INTRAMUSCULAR | Status: AC
  Filled 2017-10-24: qty 6

## 2017-10-24 MED ORDER — HYDROCODONE-ACETAMINOPHEN 5-325 MG PO TABS: 1.0000 | ORAL_TABLET | ORAL | Status: DC | PRN

## 2017-10-24 MED ORDER — IOPAMIDOL (ISOVUE-300) INJECTION 61%
50.0000 mL | Freq: Once | INTRAVENOUS | Status: AC | PRN
  Administered 2017-10-24: 50 mL

## 2017-10-24 MED ORDER — IOPAMIDOL (ISOVUE-300) INJECTION 61%
INTRAVENOUS | Status: AC
  Administered 2017-10-24: 50 mL
  Filled 2017-10-24: qty 100

## 2017-10-24 MED ORDER — MIDAZOLAM HCL 2 MG/2ML IJ SOLN
INTRAMUSCULAR | Status: AC
  Filled 2017-10-24: qty 6

## 2017-10-24 MED ORDER — MIDAZOLAM HCL 2 MG/2ML IJ SOLN
INTRAMUSCULAR | Status: AC | PRN
  Administered 2017-10-24 (×6): 1 mg via INTRAVENOUS

## 2017-10-24 MED ORDER — LIDOCAINE HCL 1 % IJ SOLN
INTRAMUSCULAR | Status: AC
  Filled 2017-10-24: qty 20

## 2017-10-24 MED ORDER — CIPROFLOXACIN IN D5W 400 MG/200ML IV SOLN
400.0000 mg | INTRAVENOUS | Status: AC
  Administered 2017-10-24: 400 mg via INTRAVENOUS

## 2017-10-24 MED ORDER — LIDOCAINE HCL 1 % IJ SOLN
INTRAMUSCULAR | Status: AC | PRN
  Administered 2017-10-24: 10 mL

## 2017-10-24 NOTE — Sedation Documentation (Signed)
Pt states his legs are hurting.

## 2017-10-24 NOTE — Discharge Instructions (Signed)
Moderate Conscious Sedation, Adult, Care After These instructions provide you with information about caring for yourself after your procedure. Your health care provider may also give you more specific instructions. Your treatment has been planned according to current medical practices, but problems sometimes occur. Call your health care provider if you have any problems or questions after your procedure. What can I expect after the procedure? After your procedure, it is common:  To feel sleepy for several hours.  To feel clumsy and have poor balance for several hours.  To have poor judgment for several hours.  To vomit if you eat too soon.  Follow these instructions at home: For at least 24 hours after the procedure:   Do not: ? Participate in activities where you could fall or become injured. ? Drive. ? Use heavy machinery. ? Drink alcohol. ? Take sleeping pills or medicines that cause drowsiness. ? Make important decisions or sign legal documents. ? Take care of children on your own.  Rest. Eating and drinking  Follow the diet recommended by your health care provider.  If you vomit: ? Drink water, juice, or soup when you can drink without vomiting. ? Make sure you have little or no nausea before eating solid foods. General instructions  Have a responsible adult stay with you until you are awake and alert.  Take over-the-counter and prescription medicines only as told by your health care provider.  If you smoke, do not smoke without supervision.  Keep all follow-up visits as told by your health care provider. This is important. Contact a health care provider if:  You keep feeling nauseous or you keep vomiting.  You feel light-headed.  You develop a rash.  You have a fever. Get help right away if:  You have trouble breathing. This information is not intended to replace advice given to you by your health care provider. Make sure you discuss any questions you have  with your health care provider. Document Released: 08/19/2013 Document Revised: 04/02/2016 Document Reviewed: 02/18/2016 Elsevier Interactive Patient Education  2018 [employer], Adult An incision is a cut that a doctor makes in your skin for surgery (for a procedure). Most times, these cuts are closed after surgery. Your cut from surgery may be closed with stitches (sutures), staples, skin glue, or skin tape (adhesive strips). You may need to return to your doctor to have stitches or staples taken out. This may happen many days or many weeks after your surgery. The cut needs to be well cared for so it does not get infected. How to care for your cut Cut care  Follow instructions from your doctor about how to take care of your cut. Make sure you: ? Wash your hands with soap and water before you change your bandage (dressing). If you cannot use soap and water, use hand sanitizer. ? Change your bandage as told by your doctor.  You may remove your dressing tomorrow. ? Leave stitches, skin glue, or skin tape in place. They may need to stay in place for 2 weeks or longer. If tape strips get loose and curl up, you may trim the loose edges. Do not remove tape strips completely unless your doctor says it is okay.  Check your cut area every day for signs of infection. Check for: ? More redness, swelling, or pain. ? More fluid or blood. ? Warmth. ? Pus or a bad smell.  Ask your doctor how to clean the cut. This may include: ?  Using mild soap and water. ? Using a clean towel to pat the cut dry after you clean it. ? Putting a cream or ointment on the cut. Do this only as told by your doctor. ? Covering the cut with a clean bandage.  Ask your doctor when you can leave the cut uncovered.  Do not take baths, swim, or use a hot tub until your doctor says it is okay. Ask your doctor if you can take showers. You may only be allowed to take sponge baths for bathing. Medicines  If you  were prescribed an antibiotic medicine, cream, or ointment, take the antibiotic or put it on the cut as told by your doctor. Do not stop taking or putting on the antibiotic even if your condition gets better.  Take over-the-counter and prescription medicines only as told by your doctor. General instructions  Limit movement around your cut. This helps healing. ? Avoid straining, lifting, or exercise for the first month, or for as long as told by your doctor. ? Follow instructions from your doctor about going back to your normal activities. ? Ask your doctor what activities are safe.  Protect your cut from the sun when you are outside for the first 6 months, or for as long as told by your doctor. Put on sunscreen around the scar or cover up the scar.  Keep all follow-up visits as told by your doctor. This is important. Contact a doctor if:  Your have more redness, swelling, or pain around the cut.  You have more fluid or blood coming from the cut.  Your cut feels warm to the touch.  You have pus or a bad smell coming from the cut.  You have a fever or shaking chills.  You feel sick to your stomach (nauseous) or you throw up (vomit).  You are dizzy.  Your stitches or staples come undone. Get help right away if:  You have a red streak coming from your cut.  Your cut bleeds through the bandage and the bleeding does not stop with gentle pressure.  The edges of your cut open up and separate.  You have very bad (severe) pain.  You have a rash.  You are confused.  You pass out (faint).  You have trouble breathing and you have a fast heartbeat. This information is not intended to replace advice given to you by your health care provider. Make sure you discuss any questions you have with your health care provider. Document Released: 01/21/2012 Document Revised: 07/06/2016 Document Reviewed: 07/06/2016 Elsevier Interactive Patient Education  2017 [employer] Inc[employer]  Ureteral Stent  Implantation, Care After Refer to this sheet in the next few weeks. These instructions provide you with information about caring for yourself after your procedure. Your health care provider may also give you more specific instructions. Your treatment has been planned according to current medical practices, but problems sometimes occur. Call your health care provider if you have any problems or questions after your procedure. What can I expect after the procedure? After the procedure, it is common to have:  Nausea.  Mild pain when you urinate. You may feel this pain in your lower back or lower abdomen. Pain should stop within a few minutes after you urinate. This may last for up to 1 week.  A small amount of blood in your urine for several days.  Follow these instructions at home:  Medicines  Take over-the-counter and prescription medicines only as told by your health care provider.  If you were prescribed an antibiotic medicine, take it as told by your health care provider. Do not stop taking the antibiotic even if you start to feel better.  Do not drive for 24 hours if you received a sedative.  Do not drive or operate heavy machinery while taking prescription pain medicines. Activity  Return to your normal activities as told by your health care provider. Ask your health care provider what activities are safe for you.  Do not lift anything that is heavier than 10 lb (4.5 kg). Follow this limit for 1 week after your procedure, or for as long as told by your health care provider. General instructions  Watch for any blood in your urine. Call your health care provider if the amount of blood in your urine increases.  If you have a catheter: ? Follow instructions from your health care provider about taking care of your catheter and collection bag. ? Do not take baths, swim, or use a hot tub until your health care provider approves.  Drink enough fluid to keep your urine clear or pale  yellow.  Keep all follow-up visits as told by your health care provider. This is important. Contact a health care provider if:  You have pain that gets worse or does not get better with medicine, especially pain when you urinate.  You have difficulty urinating.  You feel nauseous or you vomit repeatedly during a period of more than 2 days after the procedure. Get help right away if:  Your urine is dark red or has blood clots in it.  You are leaking urine (have incontinence).  The end of the stent comes out of your urethra.  You cannot urinate.  You have sudden, sharp, or severe pain in your abdomen or lower back.  You have a fever. This information is not intended to replace advice given to you by your health care provider. Make sure you discuss any questions you have with your health care provider. Document Released: 07/01/2013 Document Revised: 04/05/2016 Document Reviewed: 05/13/2015 Elsevier Interactive Patient Education  [employer].

## 2017-10-24 NOTE — Procedures (Signed)
  Procedure: Antegrade L nephrostogram, L ureteral dilatation and stent placement  (retrograde) 27F Preprocedure diagnosis: Ureteral stricture Postprocedure diagnosis: same EBL:   minimal Complications:  none immediate  See full dictation in [facility].  Dillard Cannon MD Main # [phone] Pager  [phone]

## 2017-10-24 NOTE — H&P (Signed)
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  He currently denies fever, headache, chest pain, dyspnea, cough, nausea, vomiting or bleeding.  He does have some mild pelvic and intermittent back pain. Past Medical History:  Diagnosis Date  . Bladder cancer ([place]) dx'd 07/2017  . Cancer [facility])    PROSTATE CANCER  . Complication of anesthesia    AFTER GB SURGERY PT FELT LIKE FLOOR AND BED VIBRATING--WOKE UP TO FIND HIMSELF STANDING UP AT BEDSIDE.  Marland Kitchen GERD (gastroesophageal reflux disease)   . H/O hiatal hernia   . RBBB    NO PROBLEMS   Past Surgical History:  Procedure Laterality Date  . APPENDECTOMY  1972  . CHOLECYSTECTOMY  1985  . CYSTOSCOPY WITH RETROGRADE PYELOGRAM, URETEROSCOPY AND STENT PLACEMENT Right 06/10/2017   Procedure: CYSTOSCOPY WITH RIGHT URETEROSCOPY WITH RIGHT STENT PLACEMENT;  Surgeon: Raynelle Bring, MD;  Location[facility];  Service: Urology;  Laterality: Right;  . IR NEPHROSTOMY PLACEMENT LEFT  10/07/2017  . ROBOT ASSISTED LAPAROSCOPIC RADICAL PROSTATECTOMY  10/02/2012   Procedure: ROBOTIC ASSISTED LAPAROSCOPIC RADICAL PROSTATECTOMY LEVEL 2;  Surgeon: Dutch Gray, MD;  [facility];  Service: Urology;  Laterality: N/A;  . ROBOTIC ASSISTED LAPAROSCOPIC BLADDER DIVERTICULECTOMY N/A 08/08/2017   Procedure: XI ROBOTIC ASSISTED LAPAROSCOPIC RADICAL CYSTECTOMY COVERTED TO OPEN PELVIC LYMPHADNECTOMY BILATERAL AND ILEAL CONDUIT URINARY DIVERSION;  Surgeon: Raynelle Bring, MD;  [facility];  Service: Urology;  Laterality: N/A;  . TRANSURETHRAL RESECTION  OF BLADDER TUMOR  06/10/2017   Procedure: TRANSURETHRAL RESECTION OF BLADDER TUMOR (TURBT);  Surgeon: Raynelle Bring, MD;  [facility];  Service: Urology;;  . XI ROBOT ABDOMINAL PERINEAL RESECTION N/A 08/08/2017   Procedure: REPAIR OF RECTAL TEAR POSSIBLE PARTIAL PROCTECTOMY, CREATION OF  OSTOMY;  Surgeon: Leighton Ruff, MD;  [facility];  Service: General;  Laterality: N/A;        Allergies: Demerol [meperidine]  Medications: Prior to Admission medications   Medication Sig Start Date End Date Taking? Authorizing Provider  acetaminophen (TYLENOL) 500 MG tablet Take 1,000 mg by mouth daily as needed for mild pain. For pain   Yes [provider]  caspofungin (CANCIDAS) IVPB Inject 50 mg into the vein daily for 11 days. Indication:  Infected urionma/abscess growing Candida albicans Last Day of Therapy:  10/25/2017 Labs - Once weekly:  CBC/D and BMP, Labs - Every other week:  ESR and CRP 10/14/17 10/25/17 Yes Eugenie Filler, MD  cefTRIAXone (ROCEPHIN) IVPB Inject 2 g into the vein daily for 11 days. Indication:  Klebsiella pneumoniae bacteremia Last Day of Therapy:  10/25/2017 Labs - Once weekly:  CBC/D and BMP, Labs - Every other week:  ESR and CRP 10/14/17 10/25/17 Yes Eugenie Filler, MD  docusate sodium (COLACE) 100 MG capsule Take 2 capsules (200 mg total) by mouth 2 (two) times daily. 08/14/17 08/14/18 Yes McCormickLenna Sciara, MD  esomeprazole (NEXIUM) 20 MG capsule Take 20 mg by mouth daily as needed (heartburn).   Yes [provider]  oxyCODONE-acetaminophen (PERCOCET/ROXICET) 5-325 MG tablet Take 1-2 tablets by mouth every 4 (four) hours as needed for moderate pain or severe pain ((for moderate pain,  give if Tylenol not sufficient)). 10/14/17  Yes Eugenie Filler, MD  protein supplement shake (PREMIER PROTEIN) LIQD Take 325 mLs (11 oz total) by mouth 2 (two) times daily between meals. 10/15/17  Yes Eugenie Filler, MD  senna-docusate (SENOKOT-S)  8.6-50 MG tablet Take 2 tablets by mouth 2 (two) times daily. 10/14/17  Yes Eugenie Filler, MD  ondansetron (ZOFRAN) 4 MG tablet Take 1 tablet (4 mg total) by mouth every 6 (six) hours as needed for nausea. 10/14/17   Eugenie Filler, MD  polyethylene glycol [facility] Floria Raveling) packet Take 17 g by mouth daily. 10/15/17   Eugenie Filler, MD     Vital Signs: BP (!) 152/92 (BP Location: Right Arm)   Pulse (!) 107   Temp (!) 97.5 F (36.4 C) (Oral)   Resp 18   SpO2 98%   Physical Exam awake, alert.  Chest clear to auscultation bilaterally.  Heart with regular rate and rhythm.  Abdomen soft, +BS,intact right lower quadrant ileostomy, left lower quadrant ostomy, left PCN draining yellow urine and right-sided abdominal urinoma  drain with a small amount of turbid yellow fluid in bag  Imaging: No results found.  Labs:  CBC: Recent Labs    10/11/17 0532 10/13/17 0608 10/14/17 0555 10/24/17 1002  WBC 10.0 11.0* 11.6* 7.3  HGB 9.4* 9.9* 10.2* 11.7*  HCT 29.4* 30.7* 32.2* 37.1*  PLT 235 259 283 226    COAGS: Recent Labs    10/07/17 0947  INR 1.18    BMP: Recent Labs    10/10/17 0518 10/11/17 0532 10/12/17 1851 10/13/17 0608 10/14/17 0555  NA 140 141  --  139 138  K 3.4* 3.0* 3.6 3.6 3.9  CL 112* 112*  --  106 106  CO2 23 24  --  25 25  GLUCOSE 138* 103*  --  100* 102*  BUN 11 9  --  8 8  CALCIUM 7.5* 7.4*  --  8.1* 8.3*  CREATININE 0.93 0.75  --  0.59* 0.68  GFRNONAA >60 >60  --  >60 >60  GFRAA >60 >60  --  >60 >60    LIVER FUNCTION TESTS: Recent Labs    10/04/17 0522 10/05/17 0602 10/07/17 0456  BILITOT 0.7 0.8 0.6  AST 28 16 14*  ALT 31 21 15*  ALKPHOS 67 61 64  PROT 6.5 5.6* 5.9*  ALBUMIN 2.7* 2.1* 2.3*    Assessment and Plan: Pt with hx bladder/prostate cancer, s/p cystoprostatectomy/ileal conduit, colostomy after rectal injury 08/08/17; now with delayed urine leak, 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  Details/risks of procedure, including but not limited to, internal bleeding, infection, inability to place stent and need for prolonged drainage discussed with patient/ wife with their understanding and consent.   Electronically Signed: D. Rowe Robert, PA-C 10/24/2017, 10:24 AM   I spent a total of 20 minutes at the the patient's bedside AND on the patient's hospital floor or unit, greater than 50% of which was counseling/coordinating care for left nephrostogram with possible ureteral stent placement/urinoma drain evaluation

## 2017-10-25 DIAGNOSIS — T8143XA Infection following a procedure, organ and space surgical site, initial encounter: Secondary | ICD-10-CM | POA: Diagnosis not present

## 2017-10-25 DIAGNOSIS — C61 Malignant neoplasm of prostate: Secondary | ICD-10-CM | POA: Diagnosis not present

## 2017-10-25 DIAGNOSIS — T8144XA Sepsis following a procedure, initial encounter: Secondary | ICD-10-CM | POA: Diagnosis not present

## 2017-10-25 DIAGNOSIS — Z452 Encounter for adjustment and management of vascular access device: Secondary | ICD-10-CM | POA: Diagnosis not present

## 2017-10-25 DIAGNOSIS — C679 Malignant neoplasm of bladder, unspecified: Secondary | ICD-10-CM | POA: Diagnosis not present

## 2017-10-25 DIAGNOSIS — B961 Klebsiella pneumoniae [K. pneumoniae] as the cause of diseases classified elsewhere: Secondary | ICD-10-CM | POA: Diagnosis not present

## 2017-10-28 DIAGNOSIS — B961 Klebsiella pneumoniae [K. pneumoniae] as the cause of diseases classified elsewhere: Secondary | ICD-10-CM | POA: Diagnosis not present

## 2017-10-28 DIAGNOSIS — C679 Malignant neoplasm of bladder, unspecified: Secondary | ICD-10-CM | POA: Diagnosis not present

## 2017-10-28 DIAGNOSIS — C61 Malignant neoplasm of prostate: Secondary | ICD-10-CM | POA: Diagnosis not present

## 2017-10-28 DIAGNOSIS — T8143XA Infection following a procedure, organ and space surgical site, initial encounter: Secondary | ICD-10-CM | POA: Diagnosis not present

## 2017-10-28 DIAGNOSIS — T819XXA Unspecified complication of procedure, initial encounter: Secondary | ICD-10-CM | POA: Diagnosis not present

## 2017-10-28 DIAGNOSIS — Z452 Encounter for adjustment and management of vascular access device: Secondary | ICD-10-CM | POA: Diagnosis not present

## 2017-10-28 DIAGNOSIS — T8144XA Sepsis following a procedure, initial encounter: Secondary | ICD-10-CM | POA: Diagnosis not present

## 2017-10-29 ENCOUNTER — Ambulatory Visit
Admission: RE | Admit: 2017-10-29 | Discharge: 2017-10-29 | Disposition: A | Discharge status: Home / Self Care | Payer: Medicare Other | Source: Ambulatory Visit | Attending: Interventional Radiology | Admitting: Interventional Radiology

## 2017-10-29 ENCOUNTER — Ambulatory Visit
Admission: RE | Admit: 2017-10-29 | Discharge: 2017-10-29 | Disposition: A | Discharge status: Home / Self Care | Payer: Self-pay | Source: Ambulatory Visit | Attending: Interventional Radiology | Admitting: Interventional Radiology

## 2017-10-29 ENCOUNTER — Other Ambulatory Visit (HOSPITAL_COMMUNITY): Payer: Self-pay | Admitting: Interventional Radiology

## 2017-10-29 DIAGNOSIS — C679 Malignant neoplasm of bladder, unspecified: Secondary | ICD-10-CM

## 2017-10-30 DIAGNOSIS — T8144XA Sepsis following a procedure, initial encounter: Secondary | ICD-10-CM | POA: Diagnosis not present

## 2017-10-30 DIAGNOSIS — Z452 Encounter for adjustment and management of vascular access device: Secondary | ICD-10-CM | POA: Diagnosis not present

## 2017-10-30 DIAGNOSIS — C679 Malignant neoplasm of bladder, unspecified: Secondary | ICD-10-CM | POA: Diagnosis not present

## 2017-10-30 DIAGNOSIS — B961 Klebsiella pneumoniae [K. pneumoniae] as the cause of diseases classified elsewhere: Secondary | ICD-10-CM | POA: Diagnosis not present

## 2017-10-30 DIAGNOSIS — C61 Malignant neoplasm of prostate: Secondary | ICD-10-CM | POA: Diagnosis not present

## 2017-10-30 DIAGNOSIS — T8143XA Infection following a procedure, organ and space surgical site, initial encounter: Secondary | ICD-10-CM | POA: Diagnosis not present

## 2017-10-31 DIAGNOSIS — B961 Klebsiella pneumoniae [K. pneumoniae] as the cause of diseases classified elsewhere: Secondary | ICD-10-CM | POA: Diagnosis not present

## 2017-10-31 DIAGNOSIS — Z452 Encounter for adjustment and management of vascular access device: Secondary | ICD-10-CM | POA: Diagnosis not present

## 2017-10-31 DIAGNOSIS — T8143XA Infection following a procedure, organ and space surgical site, initial encounter: Secondary | ICD-10-CM | POA: Diagnosis not present

## 2017-10-31 DIAGNOSIS — T8144XA Sepsis following a procedure, initial encounter: Secondary | ICD-10-CM | POA: Diagnosis not present

## 2017-10-31 DIAGNOSIS — C61 Malignant neoplasm of prostate: Secondary | ICD-10-CM | POA: Diagnosis not present

## 2017-10-31 DIAGNOSIS — C679 Malignant neoplasm of bladder, unspecified: Secondary | ICD-10-CM | POA: Diagnosis not present

## 2017-11-04 DIAGNOSIS — B961 Klebsiella pneumoniae [K. pneumoniae] as the cause of diseases classified elsewhere: Secondary | ICD-10-CM | POA: Diagnosis not present

## 2017-11-04 DIAGNOSIS — Z452 Encounter for adjustment and management of vascular access device: Secondary | ICD-10-CM | POA: Diagnosis not present

## 2017-11-04 DIAGNOSIS — C679 Malignant neoplasm of bladder, unspecified: Secondary | ICD-10-CM | POA: Diagnosis not present

## 2017-11-04 DIAGNOSIS — T8143XA Infection following a procedure, organ and space surgical site, initial encounter: Secondary | ICD-10-CM | POA: Diagnosis not present

## 2017-11-04 DIAGNOSIS — T8144XA Sepsis following a procedure, initial encounter: Secondary | ICD-10-CM | POA: Diagnosis not present

## 2017-11-04 DIAGNOSIS — C61 Malignant neoplasm of prostate: Secondary | ICD-10-CM | POA: Diagnosis not present

## 2017-11-08 DIAGNOSIS — T8143XA Infection following a procedure, organ and space surgical site, initial encounter: Secondary | ICD-10-CM | POA: Diagnosis not present

## 2017-11-08 DIAGNOSIS — C61 Malignant neoplasm of prostate: Secondary | ICD-10-CM | POA: Diagnosis not present

## 2017-11-08 DIAGNOSIS — Z452 Encounter for adjustment and management of vascular access device: Secondary | ICD-10-CM | POA: Diagnosis not present

## 2017-11-08 DIAGNOSIS — T8144XA Sepsis following a procedure, initial encounter: Secondary | ICD-10-CM | POA: Diagnosis not present

## 2017-11-08 DIAGNOSIS — C679 Malignant neoplasm of bladder, unspecified: Secondary | ICD-10-CM | POA: Diagnosis not present

## 2017-11-08 DIAGNOSIS — B961 Klebsiella pneumoniae [K. pneumoniae] as the cause of diseases classified elsewhere: Secondary | ICD-10-CM | POA: Diagnosis not present

## 2017-11-14 DIAGNOSIS — Z452 Encounter for adjustment and management of vascular access device: Secondary | ICD-10-CM | POA: Diagnosis not present

## 2017-11-14 DIAGNOSIS — B961 Klebsiella pneumoniae [K. pneumoniae] as the cause of diseases classified elsewhere: Secondary | ICD-10-CM | POA: Diagnosis not present

## 2017-11-14 DIAGNOSIS — T8143XA Infection following a procedure, organ and space surgical site, initial encounter: Secondary | ICD-10-CM | POA: Diagnosis not present

## 2017-11-14 DIAGNOSIS — C679 Malignant neoplasm of bladder, unspecified: Secondary | ICD-10-CM | POA: Diagnosis not present

## 2017-11-14 DIAGNOSIS — T8144XA Sepsis following a procedure, initial encounter: Secondary | ICD-10-CM | POA: Diagnosis not present

## 2017-11-14 DIAGNOSIS — C61 Malignant neoplasm of prostate: Secondary | ICD-10-CM | POA: Diagnosis not present

## 2017-11-15 DIAGNOSIS — B961 Klebsiella pneumoniae [K. pneumoniae] as the cause of diseases classified elsewhere: Secondary | ICD-10-CM | POA: Diagnosis not present

## 2017-11-15 DIAGNOSIS — R8271 Bacteriuria: Secondary | ICD-10-CM | POA: Diagnosis not present

## 2017-11-15 DIAGNOSIS — T8144XA Sepsis following a procedure, initial encounter: Secondary | ICD-10-CM | POA: Diagnosis not present

## 2017-11-15 DIAGNOSIS — C678 Malignant neoplasm of overlapping sites of bladder: Secondary | ICD-10-CM | POA: Diagnosis not present

## 2017-11-15 DIAGNOSIS — T8143XA Infection following a procedure, organ and space surgical site, initial encounter: Secondary | ICD-10-CM | POA: Diagnosis not present

## 2017-11-15 DIAGNOSIS — C679 Malignant neoplasm of bladder, unspecified: Secondary | ICD-10-CM | POA: Diagnosis not present

## 2017-11-15 DIAGNOSIS — Z452 Encounter for adjustment and management of vascular access device: Secondary | ICD-10-CM | POA: Diagnosis not present

## 2017-11-15 DIAGNOSIS — C61 Malignant neoplasm of prostate: Secondary | ICD-10-CM | POA: Diagnosis not present

## 2017-11-20 ENCOUNTER — Other Ambulatory Visit: Payer: Self-pay | Admitting: General Surgery

## 2017-11-25 ENCOUNTER — Other Ambulatory Visit: Payer: Self-pay | Admitting: General Surgery

## 2017-11-25 DIAGNOSIS — Z9889 Other specified postprocedural states: Secondary | ICD-10-CM

## 2017-12-02 ENCOUNTER — Ambulatory Visit
Admission: RE | Admit: 2017-12-02 | Discharge: 2017-12-02 | Disposition: A | Discharge status: Home / Self Care | Payer: Medicare Other | Source: Ambulatory Visit | Attending: General Surgery | Admitting: General Surgery

## 2017-12-02 DIAGNOSIS — C61 Malignant neoplasm of prostate: Secondary | ICD-10-CM | POA: Diagnosis not present

## 2017-12-02 DIAGNOSIS — Z9889 Other specified postprocedural states: Secondary | ICD-10-CM

## 2017-12-05 ENCOUNTER — Ambulatory Visit: Payer: Self-pay | Admitting: General Surgery

## 2017-12-06 ENCOUNTER — Other Ambulatory Visit (HOSPITAL_COMMUNITY): Payer: Self-pay | Admitting: Urology

## 2017-12-06 DIAGNOSIS — N135 Crossing vessel and stricture of ureter without hydronephrosis: Secondary | ICD-10-CM

## 2017-12-13 ENCOUNTER — Ambulatory Visit (HOSPITAL_COMMUNITY)
Admission: RE | Admit: 2017-12-13 | Discharge: 2017-12-13 | Disposition: A | Discharge status: Home / Self Care | Payer: Medicare Other | Source: Ambulatory Visit | Attending: Urology | Admitting: Urology

## 2017-12-13 ENCOUNTER — Other Ambulatory Visit (HOSPITAL_COMMUNITY): Payer: Self-pay | Admitting: Interventional Radiology

## 2017-12-13 ENCOUNTER — Ambulatory Visit (HOSPITAL_COMMUNITY)
Admission: RE | Admit: 2017-12-13 | Discharge: 2017-12-13 | Disposition: A | Discharge status: Home / Self Care | Payer: Medicare Other | Source: Ambulatory Visit | Attending: General Surgery | Admitting: General Surgery

## 2017-12-13 ENCOUNTER — Encounter (HOSPITAL_COMMUNITY): Payer: Self-pay | Admitting: Interventional Radiology

## 2017-12-13 ENCOUNTER — Other Ambulatory Visit (HOSPITAL_COMMUNITY): Payer: Self-pay | Admitting: Urology

## 2017-12-13 ENCOUNTER — Other Ambulatory Visit: Payer: Self-pay

## 2017-12-13 ENCOUNTER — Encounter (HOSPITAL_COMMUNITY)
Admission: RE | Disposition: A | Discharge status: Home / Self Care | Payer: Self-pay | Source: Ambulatory Visit | Attending: General Surgery

## 2017-12-13 DIAGNOSIS — N135 Crossing vessel and stricture of ureter without hydronephrosis: Secondary | ICD-10-CM

## 2017-12-13 DIAGNOSIS — Z87891 Personal history of nicotine dependence: Secondary | ICD-10-CM | POA: Insufficient documentation

## 2017-12-13 DIAGNOSIS — Z8551 Personal history of malignant neoplasm of bladder: Secondary | ICD-10-CM | POA: Diagnosis not present

## 2017-12-13 DIAGNOSIS — K219 Gastro-esophageal reflux disease without esophagitis: Secondary | ICD-10-CM | POA: Insufficient documentation

## 2017-12-13 DIAGNOSIS — Z09 Encounter for follow-up examination after completed treatment for conditions other than malignant neoplasm: Secondary | ICD-10-CM | POA: Diagnosis not present

## 2017-12-13 DIAGNOSIS — Z79899 Other long term (current) drug therapy: Secondary | ICD-10-CM | POA: Insufficient documentation

## 2017-12-13 DIAGNOSIS — Z9889 Other specified postprocedural states: Secondary | ICD-10-CM | POA: Insufficient documentation

## 2017-12-13 DIAGNOSIS — I4510 Unspecified right bundle-branch block: Secondary | ICD-10-CM | POA: Diagnosis not present

## 2017-12-13 DIAGNOSIS — D128 Benign neoplasm of rectum: Secondary | ICD-10-CM | POA: Diagnosis not present

## 2017-12-13 DIAGNOSIS — K449 Diaphragmatic hernia without obstruction or gangrene: Secondary | ICD-10-CM | POA: Diagnosis not present

## 2017-12-13 DIAGNOSIS — Z885 Allergy status to narcotic agent status: Secondary | ICD-10-CM | POA: Insufficient documentation

## 2017-12-13 DIAGNOSIS — Z9049 Acquired absence of other specified parts of digestive tract: Secondary | ICD-10-CM | POA: Insufficient documentation

## 2017-12-13 DIAGNOSIS — Z8546 Personal history of malignant neoplasm of prostate: Secondary | ICD-10-CM | POA: Diagnosis not present

## 2017-12-13 DIAGNOSIS — Z933 Colostomy status: Secondary | ICD-10-CM | POA: Insufficient documentation

## 2017-12-13 DIAGNOSIS — Z906 Acquired absence of other parts of urinary tract: Secondary | ICD-10-CM | POA: Insufficient documentation

## 2017-12-13 DIAGNOSIS — Z9079 Acquired absence of other genital organ(s): Secondary | ICD-10-CM | POA: Diagnosis not present

## 2017-12-13 DIAGNOSIS — Z436 Encounter for attention to other artificial openings of urinary tract: Secondary | ICD-10-CM | POA: Diagnosis not present

## 2017-12-13 DIAGNOSIS — K621 Rectal polyp: Secondary | ICD-10-CM | POA: Diagnosis not present

## 2017-12-13 HISTORY — PX: IR CATHETER TUBE CHANGE: IMG717

## 2017-12-13 HISTORY — PX: FLEXIBLE SIGMOIDOSCOPY: SHX5431

## 2017-12-13 SURGERY — SIGMOIDOSCOPY, FLEXIBLE
Anesthesia: Moderate Sedation

## 2017-12-13 MED ORDER — SODIUM CHLORIDE 0.9 % IV SOLN: INTRAVENOUS | Status: DC

## 2017-12-13 MED ORDER — IOPAMIDOL (ISOVUE-300) INJECTION 61%
INTRAVENOUS | Status: AC
  Filled 2017-12-13: qty 50

## 2017-12-13 MED ORDER — MIDAZOLAM HCL 5 MG/ML IJ SOLN
INTRAMUSCULAR | Status: AC
  Filled 2017-12-13: qty 3

## 2017-12-13 MED ORDER — DIPHENHYDRAMINE HCL 50 MG/ML IJ SOLN
INTRAMUSCULAR | Status: AC
  Filled 2017-12-13: qty 1

## 2017-12-13 MED ORDER — FENTANYL CITRATE (PF) 100 MCG/2ML IJ SOLN
INTRAMUSCULAR | Status: AC
  Filled 2017-12-13: qty 4

## 2017-12-13 MED ORDER — SPOT INK MARKER SYRINGE KIT
PACK | SUBMUCOSAL | Status: AC
  Filled 2017-12-13: qty 5

## 2017-12-13 MED ORDER — MIDAZOLAM HCL 10 MG/2ML IJ SOLN
INTRAMUSCULAR | Status: DC | PRN
  Administered 2017-12-13 (×2): 2 mg via INTRAVENOUS

## 2017-12-13 MED ORDER — FENTANYL CITRATE (PF) 100 MCG/2ML IJ SOLN
INTRAMUSCULAR | Status: DC | PRN
  Administered 2017-12-13 (×2): 25 ug via INTRAVENOUS

## 2017-12-13 NOTE — Op Note (Signed)
[facility] Patient Name: Kristopher Thompson Procedure Date: 12/13/2017 MRN: [id number] Attending MD: Leighton Ruff , MD Date of Birth: December 28, 1950 CSN: [id number] Age: 67 Admit Type: Outpatient Procedure:                Flexible Sigmoidoscopy Indications:              Post-operative assessment Providers:                Leighton Ruff, MD, Kingsley Plan, RN, Cherylynn Ridges, Technician Referring MD:              Medicines:                Fentanyl 50 micrograms IV, Midazolam 4 mg IV Complications:            No immediate complications. Estimated Blood Loss:     Estimated blood loss was minimal. Procedure:                Pre-Anesthesia Assessment:                           - Prior to the procedure, a History and Physical                            was performed, and patient medications and                            allergies were reviewed. The patient's tolerance of                            previous anesthesia was also reviewed. The risks                            and benefits of the procedure and the sedation                            options and risks were discussed with the patient.                            All questions were answered, and informed consent                            was obtained. Prior Anticoagulants: The patient has                            taken no previous anticoagulant or antiplatelet                            agents. ASA Grade Assessment: II - A patient with                            mild systemic disease. After reviewing the risks  and benefits, the patient was deemed in                            satisfactory condition to undergo the procedure.                           After obtaining informed consent, the scope was                            passed under direct vision. The Colonoscope was                            introduced through the anus and advanced to the the            rectosigmoid junction. The flexible sigmoidoscopy                            was accomplished with ease. The patient tolerated                            the procedure well. Scope withdrawal time was 12                            minutes. Scope In: 10:55:38 AM Scope Out: 11:04:36 AM Total Procedure Duration: 0 hours 8 minutes 58 seconds  Findings:      The perianal and digital rectal examinations were normal.      A 2 mm polyp was found in the rectum. The polyp was pedunculated. The       polyp was removed with a cold biopsy forceps. Resection and retrieval       were complete. Impression:               - One 2 mm polyp in the rectum, removed with a cold                            biopsy forceps. Resected and retrieved. Moderate Sedation:      Moderate (conscious) sedation was administered by the endoscopy nurse       and supervised by the endoscopist. The following parameters were       monitored: oxygen saturation, heart rate, blood pressure, and response       to care. Recommendation:           - Await pathology results.                           - Discharge patient to home (ambulatory).                           - Written discharge instructions were provided to                            the patient.                           - Resume regular diet. Procedure Code(s):        --- Professional ---  13086, 52, Sigmoidoscopy, flexible; with biopsy,                            single or multiple Diagnosis Code(s):        --- Professional ---                           Z09, Encounter for follow-up examination after                            completed treatment for conditions other than                            malignant neoplasm                           K62.1, Rectal polyp CPT copyright 2016 American Medical Association. All rights reserved. The codes documented in this report are preliminary and upon coder review may  be revised to meet current  compliance requirements. Leighton Ruff, MD Leighton Ruff, MD 03/18/8468 11:12:34 AM This report has been signed electronically. Number of Addenda: 0

## 2017-12-13 NOTE — H&P (Signed)
67 y.o. M with partial rectal resection after cystectomy.  He has a diverting colostomy.  Barium enema shows no leak.  He is here today for endoscopic evaluation prior to colostomy take-down.  Past Medical History:  Diagnosis Date  . Bladder cancer ([place]) dx'd 07/2017  . Cancer [facility])    PROSTATE CANCER  . Complication of anesthesia    AFTER GB SURGERY PT FELT LIKE FLOOR AND BED VIBRATING--WOKE UP TO FIND HIMSELF STANDING UP AT BEDSIDE.  Marland Kitchen GERD (gastroesophageal reflux disease)   . H/O hiatal hernia   . RBBB    NO PROBLEMS   Past Surgical History:  Procedure Laterality Date  . APPENDECTOMY  1972  . CHOLECYSTECTOMY  1985  . CYSTOSCOPY WITH RETROGRADE PYELOGRAM, URETEROSCOPY AND STENT PLACEMENT Right 06/10/2017   Procedure: CYSTOSCOPY WITH RIGHT URETEROSCOPY WITH RIGHT STENT PLACEMENT;  Surgeon: Raynelle Bring, MD;  [facility];  Service: Urology;  Laterality: Right;  . IR CATHETER TUBE CHANGE  12/13/2017  . IR CONVERT LEFT NEPHROSTOMY TO NEPHROURETERAL CATH  10/24/2017  . IR NEPHRO TUBE REMOV/FL  10/24/2017  . IR NEPHROSTOMY PLACEMENT LEFT  10/07/2017  . ROBOT ASSISTED LAPAROSCOPIC RADICAL PROSTATECTOMY  10/02/2012   Procedure: ROBOTIC ASSISTED LAPAROSCOPIC RADICAL PROSTATECTOMY LEVEL 2;  Surgeon: Dutch Gray, MD;  [facility];  Service: Urology;  Laterality: N/A;  . ROBOTIC ASSISTED LAPAROSCOPIC BLADDER DIVERTICULECTOMY N/A 08/08/2017   Procedure: XI ROBOTIC ASSISTED LAPAROSCOPIC RADICAL CYSTECTOMY COVERTED TO OPEN PELVIC LYMPHADNECTOMY BILATERAL AND ILEAL CONDUIT URINARY DIVERSION;  Surgeon: Raynelle Bring, MD;  [facility];  Service: Urology;  Laterality: N/A;  . TRANSURETHRAL RESECTION OF BLADDER TUMOR  06/10/2017   Procedure: TRANSURETHRAL RESECTION OF BLADDER TUMOR (TURBT);  Surgeon: Raynelle Bring, MD;  [facility];  Service: Urology;;  . XI ROBOT ABDOMINAL PERINEAL RESECTION N/A 08/08/2017   Procedure: REPAIR OF RECTAL TEAR POSSIBLE PARTIAL PROCTECTOMY, CREATION OF   OSTOMY;  Surgeon: Leighton Ruff, MD;  Location: [facility];  Service: General;  Laterality: N/A;   Social History   Socioeconomic History  . Marital status: Married    Spouse name: Not on file  . Number of children: Not on file  . Years of education: Not on file  . Highest education level: Not on file  Social Needs  . Financial resource strain: Not on file  . Food insecurity - worry: Not on file  . Food insecurity - inability: Not on file  . Transportation needs - medical: Not on file  . Transportation needs - non-medical: Not on file  Occupational History  . Not on file  Tobacco Use  . Smoking status: Former Research scientist (life sciences)  . Smokeless tobacco: Never Used  . Tobacco comment: 67 years old to 67 years old  Substance and Sexual Activity  . Alcohol use: Yes    Comment: occasional  . Drug use: No  . Sexual activity: Yes  Other Topics Concern  . Not on file  Social History Narrative  . Not on file   History reviewed. No pertinent family history.  Current Meds  Medication Sig  . docusate sodium (COLACE) 100 MG capsule Take 2 capsules (200 mg total) by mouth 2 (two) times daily.  Marland Kitchen esomeprazole (NEXIUM) 20 MG capsule Take 20 mg by mouth daily as needed (heartburn).   Allergies  Allergen Reactions  . Demerol [Meperidine] Other (See Comments)    SEVERE NAUSEA    Review of Systems - General ROS: negative for - chills or fever Respiratory ROS: no cough, shortness of  breath, or wheezing Cardiovascular ROS: no chest pain or dyspnea on exertion Gastrointestinal ROS: no abdominal pain, change in bowel habits, or black or bloody stools Genito-Urinary ROS: no dysuria, trouble voiding, or hematuria   BP 130/72   Pulse 68   Resp 16   Ht 6' (1.829 m)   Wt 110.7 kg (244 lb)   SpO2 100%   BMI 33.09 kg/m    Physical Exam  Constitutional: He is oriented to person, place, and time. He appears well-developed and well-nourished.  HENT:  Head: Normocephalic and atraumatic.  Eyes:  Conjunctivae and EOM are normal. Pupils are equal, round, and reactive to light.  Neck: Normal range of motion. Neck supple.  Cardiovascular: Normal rate and regular rhythm.  Pulmonary/Chest: Effort normal.  Abdominal: Soft. He exhibits no distension. There is no tenderness.  Musculoskeletal: Normal range of motion.  Neurological: He is alert and oriented to person, place, and time.  Skin: Skin is warm and dry.   Assessment: Post surgical evaluation  Plan: flexible proctoscopy today to eval for occult stricture and mucosal healing prior to reversing his ostomy.  Risks include bleeding (common and self-limited) and perforation (rare).

## 2017-12-13 NOTE — H&P (View-Only) (Signed)
67 y.o. M with partial rectal resection after cystectomy.  He has a diverting colostomy.  Barium enema shows no leak.  He is here today for endoscopic evaluation prior to colostomy take-down.  Past Medical History:  Diagnosis Date  . Bladder cancer ([place]) dx'd 07/2017  . Cancer [facility])    PROSTATE CANCER  . Complication of anesthesia    AFTER GB SURGERY PT FELT LIKE FLOOR AND BED VIBRATING--WOKE UP TO FIND HIMSELF STANDING UP AT BEDSIDE.  Marland Kitchen GERD (gastroesophageal reflux disease)   . H/O hiatal hernia   . RBBB    NO PROBLEMS   Past Surgical History:  Procedure Laterality Date  . APPENDECTOMY  1972  . CHOLECYSTECTOMY  1985  . CYSTOSCOPY WITH RETROGRADE PYELOGRAM, URETEROSCOPY AND STENT PLACEMENT Right 06/10/2017   Procedure: CYSTOSCOPY WITH RIGHT URETEROSCOPY WITH RIGHT STENT PLACEMENT;  Surgeon: Raynelle Bring, MD;  [facility];  Service: Urology;  Laterality: Right;  . IR CATHETER TUBE CHANGE  12/13/2017  . IR CONVERT LEFT NEPHROSTOMY TO NEPHROURETERAL CATH  10/24/2017  . IR NEPHRO TUBE REMOV/FL  10/24/2017  . IR NEPHROSTOMY PLACEMENT LEFT  10/07/2017  . ROBOT ASSISTED LAPAROSCOPIC RADICAL PROSTATECTOMY  10/02/2012   Procedure: ROBOTIC ASSISTED LAPAROSCOPIC RADICAL PROSTATECTOMY LEVEL 2;  Surgeon: Dutch Gray, MD;  [facility];  Service: Urology;  Laterality: N/A;  . ROBOTIC ASSISTED LAPAROSCOPIC BLADDER DIVERTICULECTOMY N/A 08/08/2017   Procedure: XI ROBOTIC ASSISTED LAPAROSCOPIC RADICAL CYSTECTOMY COVERTED TO OPEN PELVIC LYMPHADNECTOMY BILATERAL AND ILEAL CONDUIT URINARY DIVERSION;  Surgeon: Raynelle Bring, MD;  [facility];  Service: Urology;  Laterality: N/A;  . TRANSURETHRAL RESECTION OF BLADDER TUMOR  06/10/2017   Procedure: TRANSURETHRAL RESECTION OF BLADDER TUMOR (TURBT);  Surgeon: Raynelle Bring, MD;  [facility];  Service: Urology;;  . XI ROBOT ABDOMINAL PERINEAL RESECTION N/A 08/08/2017   Procedure: REPAIR OF RECTAL TEAR POSSIBLE PARTIAL PROCTECTOMY, CREATION OF   OSTOMY;  Surgeon: Leighton Ruff, MD;  [facility];  Service: General;  Laterality: N/A;   Social History   Socioeconomic History  . Marital status: Married    Spouse name: Not on file  . Number of children: Not on file  . Years of education: Not on file  . Highest education level: Not on file  Social Needs  . Financial resource strain: Not on file  . Food insecurity - worry: Not on file  . Food insecurity - inability: Not on file  . Transportation needs - medical: Not on file  . Transportation needs - non-medical: Not on file  Occupational History  . Not on file  Tobacco Use  . Smoking status: Former Research scientist (life sciences)  . Smokeless tobacco: Never Used  . Tobacco comment: 67 years old to 67 years old  Substance and Sexual Activity  . Alcohol use: Yes    Comment: occasional  . Drug use: No  . Sexual activity: Yes  Other Topics Concern  . Not on file  Social History Narrative  . Not on file   History reviewed. No pertinent family history.  Current Meds  Medication Sig  . docusate sodium (COLACE) 100 MG capsule Take 2 capsules (200 mg total) by mouth 2 (two) times daily.  Marland Kitchen esomeprazole (NEXIUM) 20 MG capsule Take 20 mg by mouth daily as needed (heartburn).   Allergies  Allergen Reactions  . Demerol [Meperidine] Other (See Comments)    SEVERE NAUSEA    Review of Systems - General ROS: negative for - chills or fever Respiratory ROS: no cough, shortness of  breath, or wheezing Cardiovascular ROS: no chest pain or dyspnea on exertion Gastrointestinal ROS: no abdominal pain, change in bowel habits, or black or bloody stools Genito-Urinary ROS: no dysuria, trouble voiding, or hematuria   BP 130/72   Pulse 68   Resp 16   Ht 6' (1.829 m)   Wt 110.7 kg (244 lb)   SpO2 100%   BMI 33.09 kg/m    Physical Exam  Constitutional: He is oriented to person, place, and time. He appears well-developed and well-nourished.  HENT:  Head: Normocephalic and atraumatic.  Eyes:  Conjunctivae and EOM are normal. Pupils are equal, round, and reactive to light.  Neck: Normal range of motion. Neck supple.  Cardiovascular: Normal rate and regular rhythm.  Pulmonary/Chest: Effort normal.  Abdominal: Soft. He exhibits no distension. There is no tenderness.  Musculoskeletal: Normal range of motion.  Neurological: He is alert and oriented to person, place, and time.  Skin: Skin is warm and dry.   Assessment: Post surgical evaluation  Plan: flexible proctoscopy today to eval for occult stricture and mucosal healing prior to reversing his ostomy.  Risks include bleeding (common and self-limited) and perforation (rare).

## 2017-12-13 NOTE — Discharge Instructions (Signed)
Post Colonoscopy Instructions ° °1. DIET: Follow a light bland diet the first 24 hours after arrival home, such as soup, liquids, crackers, etc.  Be sure to include lots of fluids daily.  Avoid fast food or heavy meals as your are more likely to get nauseated.   °2. You may have some mild rectal bleeding for the first few days after the procedure.  This should get less and less with time.  Resume any blood thinners 2 days after your procedure unless directed otherwise by your physician. °3. Take your usually prescribed home medications unless otherwise directed. °a. If you have any pain, it is helpful to get up and walk around, as it is usually from excess gas. °b. If this is not helpful, you can take an over-the-counter pain medication.  Choose one of the following that works best for you: °i. Naproxen (Aleve, etc)  Two 220mg tabs twice a day °ii. Ibuprofen (Advil, etc) Three 200mg tabs four times a day (every meal & bedtime) °iii. If you still have pain after using one of these, please call the office °4. It is normal to not have a bowel movement for 2-3 days after colonoscopy.   ° °5. ACTIVITIES as tolerated:   °6. You may resume regular (light) daily activities beginning the next day--such as daily self-care, walking, climbing stairs--gradually increasing activities as tolerated.  ° ° °WHEN TO CALL US [phone]: °1. Fever over 101.5 F (38.5 C)  °2. Severe abdominal or chest pain  °3. Large amount of rectal bleeding, passing multiple blood clots  °4. Dizziness or shortness of breath °5. Increasing nausea or vomiting ° ° The clinic staff is available to answer your questions during regular business hours (8:30am-5pm).  Please don’t hesitate to call and ask to speak to one of our nurses for clinical concerns.  ° If you have a medical emergency, go to the nearest emergency room or call 911. ° A surgeon from [facility] is always on call at the hospitals ° ° °[facility] °[address], [address], [place], [place]  [address] ? °MAIN: [phone] ? TOLL FREE: [phone] ?  °FAX [phone] °[email or web address] ° ° °

## 2017-12-13 NOTE — Procedures (Signed)
Interventional Radiology Procedure Note  Procedure: Exchange of left retrograde ureteronephrostomy. 36F drain.   Complications: None  Findings: encrustation of the tube, with partial blockage  Recommendations:  - Routine exchange, 10 F 45cm - OK for Dc - Routine care   Signed,  Dulcy Fanny. Earleen Newport, DO

## 2017-12-16 ENCOUNTER — Encounter (HOSPITAL_COMMUNITY): Payer: Self-pay | Admitting: General Surgery

## 2017-12-16 ENCOUNTER — Ambulatory Visit: Payer: Self-pay | Admitting: General Surgery

## 2017-12-20 NOTE — Patient Instructions (Addendum)
Kristopher Thompson, DDS  12/20/2017   Your procedure is scheduled on: 01/08/2018   Report to [facility] Main  Entrance  Report to admitting at   12:45pm   Call this number if you have problems the morning of surgery [phone]   Remember: Do not eat food or drink liquids :After Midnight. With exception clear liquids until 11:45 AM     Take these medicines the morning of surgery with A SIP OF WATER:  Nexium if needed   CLEAR LIQUID DIET   Foods Allowed                                                                     Foods Excluded  Coffee and tea, regular and decaf                             liquids that you cannot  Plain Jell-O in any flavor                                             see through such as: Fruit ices (not with fruit pulp)                                     milk, soups, orange juice  Iced Popsicles                                    All solid food Carbonated beverages, regular and diet                                    Cranberry, grape and apple juices Sports drinks like Gatorade Lightly seasoned clear broth or consume(fat free) Sugar, honey syrup  Sample Menu Breakfast                                Lunch                                     Supper Cranberry juice                    Beef broth                            Chicken broth Jell-O                                     Grape juice  Apple juice Coffee or tea                        Jell-O                                      Popsicle                                                Coffee or tea                        Coffee or tea  _____________________________________________________________________                                  Kristopher Thompson may not have any metal on your body including hair pins and              piercings  Do not wear jewelry, lotions, powders or perfumes, deodorant                 Men may shave face and neck.   Do not bring  valuables to the hospital. [place].  Contacts, dentures or bridgework may not be worn into surgery.  Leave suitcase in the car. After surgery it may be brought to your room.                 Please read over the following fact sheets you were given: _____________________________________________________________________             [facility] - Preparing for Surgery Before surgery, you can play an important role.  Because skin is not sterile, your skin needs to be as free of germs as possible.  You can reduce the number of germs on your skin by washing with CHG (chlorahexidine gluconate) soap before surgery.  CHG is an antiseptic cleaner which kills germs and bonds with the skin to continue killing germs even after washing. Please DO NOT use if you have an allergy to CHG or antibacterial soaps.  If your skin becomes reddened/irritated stop using the CHG and inform your nurse when you arrive at Short Stay. Do not shave (including legs and underarms) for at least 48 hours prior to the first CHG shower.  You may shave your face/neck. Please follow these instructions carefully:  1.  Shower with CHG Soap the night before surgery and the  morning of Surgery.  2.  If you choose to wash your hair, wash your hair first as usual with your  normal  shampoo.  3.  After you shampoo, rinse your hair and body thoroughly to remove the  shampoo.                           4.  Use CHG as you would any other liquid soap.  You can apply chg directly  to the skin and wash                       Gently with a scrungie or  clean washcloth.  5.  Apply the CHG Soap to your body ONLY FROM THE NECK DOWN.   Do not use on face/ open                           Wound or open sores. Avoid contact with eyes, ears mouth and genitals (private parts).                       Wash face,  Genitals (private parts) with your normal soap.             6.  Wash thoroughly, paying special  attention to the area where your surgery  will be performed.  7.  Thoroughly rinse your body with warm water from the neck down.  8.  DO NOT shower/wash with your normal soap after using and rinsing off  the CHG Soap.                9.  Pat yourself dry with a clean towel.            10.  Wear clean pajamas.            11.  Place clean sheets on your bed the night of your first shower and do not  sleep with pets. Day of Surgery : Do not apply any lotions/deodorants the morning of surgery.  Please wear clean clothes to the hospital/surgery center.  FAILURE TO FOLLOW THESE INSTRUCTIONS MAY RESULT IN THE CANCELLATION OF YOUR SURGERY PATIENT SIGNATURE_________________________________  NURSE SIGNATURE__________________________________  ________________________________________________________________________   Kristopher Thompson  An incentive spirometer is a tool that can help keep your lungs clear and active. This tool measures how well you are filling your lungs with each breath. Taking long deep breaths may help reverse or decrease the chance of developing breathing (pulmonary) problems (especially infection) following:  A long period of time when you are unable to move or be active. BEFORE THE PROCEDURE   If the spirometer includes an indicator to show your best effort, your nurse or respiratory therapist will set it to a desired goal.  If possible, sit up straight or lean slightly forward. Try not to slouch.  Hold the incentive spirometer in an upright position. INSTRUCTIONS FOR USE  1. Sit on the edge of your bed if possible, or sit up as far as you can in bed or on a chair. 2. Hold the incentive spirometer in an upright position. 3. Breathe out normally. 4. Place the mouthpiece in your mouth and seal your lips tightly around it. 5. Breathe in slowly and as deeply as possible, raising the piston or the ball toward the top of the column. 6. Hold your breath for 3-5 seconds or for  as long as possible. Allow the piston or ball to fall to the bottom of the column. 7. Remove the mouthpiece from your mouth and breathe out normally. 8. Rest for a few seconds and repeat Steps 1 through 7 at least 10 times every 1-2 hours when you are awake. Take your time and take a few normal breaths between deep breaths. 9. The spirometer may include an indicator to show your best effort. Use the indicator as a goal to work toward during each repetition. 10. After each set of 10 deep breaths, practice coughing to be sure your lungs are clear. If you have an incision (the cut made at the time of surgery), support your incision when  coughing by placing a pillow or rolled up towels firmly against it. Once you are able to get out of bed, walk around indoors and cough well. You may stop using the incentive spirometer when instructed by your caregiver.  RISKS AND COMPLICATIONS  Take your time so you do not get dizzy or light-headed.  If you are in pain, you may need to take or ask for pain medication before doing incentive spirometry. It is harder to take a deep breath if you are having pain. AFTER USE  Rest and breathe slowly and easily.  It can be helpful to keep track of a log of your progress. Your caregiver can provide you with a simple table to help with this. If you are using the spirometer at home, follow these instructions: [place] IF:   You are having difficultly using the spirometer.  You have trouble using the spirometer as often as instructed.  Your pain medication is not giving enough relief while using the spirometer.  You develop fever of 100.5 F (38.1 C) or higher. SEEK IMMEDIATE MEDICAL CARE IF:   You cough up bloody sputum that had not been present before.  You develop fever of 102 F (38.9 C) or greater.  You develop worsening pain at or near the incision site. MAKE SURE YOU:   Understand these instructions.  Will watch your condition.  Will get  help right away if you are not doing well or get worse. Document Released: 03/11/2007 Document Revised: 01/21/2012 Document Reviewed: 05/12/2007 Exit[employer] Patient Information 2014 [employer].   ________________________________________________________________________ DRINK 2 PRESURGERY ENSURE DRINKS THE NIGHT BEFORE SURGERY AT  1000 PM AND 1 PRESURGERY DRINK THE DAY OF THE PROCEDURE 3 HOURS PRIOR TO SCHEDULED SURGERY. NO SOLINDS AFTER MIDNIGHT THE DAY PRIOR TO THE SURGERY. NOTHING BY MOUTH EXCEPT CLEAR LIQUIDS UNTIL THREE HOURS PRIOR TO SCHEDULED SURGERY. PLEASE FINISH PRESURGERY ENSURE DRINK PER SURGEON ORDER 3 HOURS PRIOR TO SCHEDULED SURGERY TIME WHICH NEEDS TO BE COMPLETED AT _____1145am____.

## 2017-12-24 ENCOUNTER — Encounter (HOSPITAL_COMMUNITY): Payer: Self-pay

## 2017-12-25 ENCOUNTER — Other Ambulatory Visit: Payer: Self-pay

## 2017-12-25 ENCOUNTER — Encounter (HOSPITAL_COMMUNITY): Payer: Self-pay

## 2017-12-25 ENCOUNTER — Encounter (HOSPITAL_COMMUNITY)
Admission: RE | Admit: 2017-12-25 | Discharge: 2017-12-25 | Disposition: A | Discharge status: Home / Self Care | Payer: Medicare Other | Source: Ambulatory Visit | Attending: General Surgery | Admitting: General Surgery

## 2017-12-25 DIAGNOSIS — A419 Sepsis, unspecified organism: Secondary | ICD-10-CM | POA: Insufficient documentation

## 2017-12-25 DIAGNOSIS — K219 Gastro-esophageal reflux disease without esophagitis: Secondary | ICD-10-CM | POA: Insufficient documentation

## 2017-12-25 DIAGNOSIS — R7881 Bacteremia: Secondary | ICD-10-CM | POA: Insufficient documentation

## 2017-12-25 DIAGNOSIS — Z933 Colostomy status: Secondary | ICD-10-CM | POA: Insufficient documentation

## 2017-12-25 DIAGNOSIS — Z01812 Encounter for preprocedural laboratory examination: Secondary | ICD-10-CM | POA: Diagnosis not present

## 2017-12-25 DIAGNOSIS — E669 Obesity, unspecified: Secondary | ICD-10-CM | POA: Insufficient documentation

## 2017-12-25 DIAGNOSIS — C679 Malignant neoplasm of bladder, unspecified: Secondary | ICD-10-CM | POA: Insufficient documentation

## 2017-12-25 DIAGNOSIS — Z936 Other artificial openings of urinary tract status: Secondary | ICD-10-CM | POA: Diagnosis not present

## 2017-12-25 HISTORY — DX: Presence of spectacles and contact lenses: Z97.3

## 2017-12-25 HISTORY — DX: Malignant neoplasm of prostate: C61

## 2017-12-25 HISTORY — DX: Personal history of other infectious and parasitic diseases: Z86.19

## 2017-12-25 HISTORY — DX: Other specified personal risk factors, not elsewhere classified: Z91.89

## 2017-12-25 HISTORY — DX: Colostomy status: Z93.3

## 2017-12-25 LAB — CBC
HEMATOCRIT: 42.2 % (ref 39.0–52.0)
HEMOGLOBIN: 13.7 g/dL (ref 13.0–17.0)
MCH: 24.9 pg — AB (ref 26.0–34.0)
MCHC: 32.5 g/dL (ref 30.0–36.0)
MCV: 76.7 fL — ABNORMAL LOW (ref 78.0–100.0)
Platelets: 186 10*3/uL (ref 150–400)
RBC: 5.5 MIL/uL (ref 4.22–5.81)
RDW: 15.6 % — ABNORMAL HIGH (ref 11.5–15.5)
WBC: 7.8 10*3/uL (ref 4.0–10.5)

## 2017-12-25 LAB — BASIC METABOLIC PANEL
Anion gap: 10 (ref 5–15)
BUN: 18 mg/dL (ref 6–20)
CALCIUM: 9.3 mg/dL (ref 8.9–10.3)
CHLORIDE: 109 mmol/L (ref 101–111)
CO2: 24 mmol/L (ref 22–32)
Creatinine, Ser: 0.81 mg/dL (ref 0.61–1.24)
GFR calc non Af Amer: >60 mL/min (ref >60)
GLUCOSE: 98 mg/dL (ref 65–99)
POTASSIUM: 4.5 mmol/L (ref 3.5–5.1)
Sodium: 143 mmol/L (ref 135–145)

## 2017-12-25 NOTE — Progress Notes (Signed)
   12/25/17 0833  OBSTRUCTIVE SLEEP APNEA  Have you ever been diagnosed with sleep apnea through a sleep study? No  Do you snore loudly (loud enough to be heard through closed doors)?  1  Do you often feel tired, fatigued, or sleepy during the daytime (such as falling asleep during driving or talking to someone)? 1  Has anyone observed you stop breathing during your sleep? 1  Do you have, or are you being treated for high blood pressure? 0  BMI more than 35 kg/m2? 0  Age > 42 (1-yes) 1  Male Gender (Yes=1) 1  Obstructive Sleep Apnea Score 5  Score 5 or greater  Results sent to PCP

## 2017-12-30 DIAGNOSIS — R1084 Generalized abdominal pain: Secondary | ICD-10-CM | POA: Diagnosis not present

## 2017-12-30 DIAGNOSIS — C678 Malignant neoplasm of overlapping sites of bladder: Secondary | ICD-10-CM | POA: Diagnosis not present

## 2018-01-08 ENCOUNTER — Other Ambulatory Visit: Payer: Self-pay

## 2018-01-08 ENCOUNTER — Inpatient Hospital Stay (HOSPITAL_COMMUNITY): Payer: Medicare Other | Admitting: Registered Nurse

## 2018-01-08 ENCOUNTER — Encounter (HOSPITAL_COMMUNITY): Payer: Self-pay | Admitting: *Deleted

## 2018-01-08 ENCOUNTER — Inpatient Hospital Stay (HOSPITAL_COMMUNITY)
Admission: RE | Admit: 2018-01-08 | Discharge: 2018-01-10 | DRG: 331 | Disposition: A | Discharge status: Home / Self Care | Payer: Medicare Other | Source: Ambulatory Visit | Attending: General Surgery | Admitting: General Surgery

## 2018-01-08 ENCOUNTER — Encounter (HOSPITAL_COMMUNITY)
Admission: RE | Disposition: A | Discharge status: Home / Self Care | Payer: Self-pay | Source: Ambulatory Visit | Attending: General Surgery

## 2018-01-08 DIAGNOSIS — Z8546 Personal history of malignant neoplasm of prostate: Secondary | ICD-10-CM | POA: Diagnosis not present

## 2018-01-08 DIAGNOSIS — Z87891 Personal history of nicotine dependence: Secondary | ICD-10-CM

## 2018-01-08 DIAGNOSIS — R509 Fever, unspecified: Secondary | ICD-10-CM | POA: Diagnosis not present

## 2018-01-08 DIAGNOSIS — Z885 Allergy status to narcotic agent status: Secondary | ICD-10-CM

## 2018-01-08 DIAGNOSIS — Z9049 Acquired absence of other specified parts of digestive tract: Secondary | ICD-10-CM

## 2018-01-08 DIAGNOSIS — K9403 Colostomy malfunction: Secondary | ICD-10-CM | POA: Diagnosis not present

## 2018-01-08 DIAGNOSIS — S3660XA Unspecified injury of rectum, initial encounter: Secondary | ICD-10-CM | POA: Diagnosis not present

## 2018-01-08 DIAGNOSIS — K219 Gastro-esophageal reflux disease without esophagitis: Secondary | ICD-10-CM | POA: Diagnosis present

## 2018-01-08 DIAGNOSIS — Z8551 Personal history of malignant neoplasm of bladder: Secondary | ICD-10-CM

## 2018-01-08 DIAGNOSIS — Z433 Encounter for attention to colostomy: Secondary | ICD-10-CM | POA: Diagnosis not present

## 2018-01-08 DIAGNOSIS — Z9079 Acquired absence of other genital organ(s): Secondary | ICD-10-CM | POA: Diagnosis not present

## 2018-01-08 DIAGNOSIS — B373 Candidiasis of vulva and vagina: Secondary | ICD-10-CM | POA: Diagnosis not present

## 2018-01-08 DIAGNOSIS — I4510 Unspecified right bundle-branch block: Secondary | ICD-10-CM | POA: Diagnosis not present

## 2018-01-08 DIAGNOSIS — K449 Diaphragmatic hernia without obstruction or gangrene: Secondary | ICD-10-CM | POA: Diagnosis not present

## 2018-01-08 HISTORY — PX: COLOSTOMY REVERSAL: SHX5782

## 2018-01-08 SURGERY — COLOSTOMY REVERSAL
Anesthesia: General | Site: Abdomen

## 2018-01-08 MED ORDER — HYDROMORPHONE HCL 1 MG/ML IJ SOLN
INTRAMUSCULAR | Status: AC
Start: 2018-01-08 — End: 2018-01-09
  Filled 2018-01-08: qty 1

## 2018-01-08 MED ORDER — PROPOFOL 10 MG/ML IV BOLUS
INTRAVENOUS | Status: AC
  Filled 2018-01-08: qty 40

## 2018-01-08 MED ORDER — ROCURONIUM BROMIDE 10 MG/ML (PF) SYRINGE
PREFILLED_SYRINGE | INTRAVENOUS | Status: DC | PRN
  Administered 2018-01-08 (×2): 20 mg via INTRAVENOUS
  Administered 2018-01-08: 60 mg via INTRAVENOUS

## 2018-01-08 MED ORDER — LACTATED RINGERS IV SOLN
INTRAVENOUS | Status: DC
  Administered 2018-01-08: 13:00:00 via INTRAVENOUS

## 2018-01-08 MED ORDER — CEFOTETAN DISODIUM-DEXTROSE 2-2.08 GM-%(50ML) IV SOLR
2.0000 g | Freq: Two times a day (BID) | INTRAVENOUS | Status: AC
  Administered 2018-01-09: 2 g via INTRAVENOUS
  Filled 2018-01-08: qty 50

## 2018-01-08 MED ORDER — PROPOFOL 10 MG/ML IV BOLUS
INTRAVENOUS | Status: DC | PRN
  Administered 2018-01-08: 200 mg via INTRAVENOUS

## 2018-01-08 MED ORDER — PANTOPRAZOLE SODIUM 40 MG PO TBEC
40.0000 mg | DELAYED_RELEASE_TABLET | Freq: Every day | ORAL | Status: DC
  Administered 2018-01-08 – 2018-01-10 (×3): 40 mg via ORAL
  Filled 2018-01-08 (×3): qty 1

## 2018-01-08 MED ORDER — ALVIMOPAN 12 MG PO CAPS
12.0000 mg | ORAL_CAPSULE | ORAL | Status: AC
  Administered 2018-01-08: 12 mg via ORAL
  Filled 2018-01-08: qty 1

## 2018-01-08 MED ORDER — FENTANYL CITRATE (PF) 250 MCG/5ML IJ SOLN
INTRAMUSCULAR | Status: AC
  Filled 2018-01-08: qty 5

## 2018-01-08 MED ORDER — GABAPENTIN 300 MG PO CAPS
300.0000 mg | ORAL_CAPSULE | ORAL | Status: AC
  Administered 2018-01-08: 300 mg via ORAL
  Filled 2018-01-08: qty 1

## 2018-01-08 MED ORDER — KETAMINE HCL 10 MG/ML IJ SOLN
INTRAMUSCULAR | Status: DC | PRN
  Administered 2018-01-08: 40 mg via INTRAVENOUS

## 2018-01-08 MED ORDER — LIDOCAINE 2% (20 MG/ML) 5 ML SYRINGE
INTRAMUSCULAR | Status: DC | PRN
  Administered 2018-01-08: 100 mg via INTRAVENOUS

## 2018-01-08 MED ORDER — DIPHENHYDRAMINE HCL 50 MG/ML IJ SOLN: 12.5000 mg | Freq: Four times a day (QID) | INTRAMUSCULAR | Status: DC | PRN

## 2018-01-08 MED ORDER — SACCHAROMYCES BOULARDII 250 MG PO CAPS
250.0000 mg | ORAL_CAPSULE | Freq: Two times a day (BID) | ORAL | Status: DC
  Administered 2018-01-08 – 2018-01-10 (×4): 250 mg via ORAL
  Filled 2018-01-08 (×4): qty 1

## 2018-01-08 MED ORDER — PHENYLEPHRINE 40 MCG/ML (10ML) SYRINGE FOR IV PUSH (FOR BLOOD PRESSURE SUPPORT)
PREFILLED_SYRINGE | INTRAVENOUS | Status: AC
  Filled 2018-01-08: qty 10

## 2018-01-08 MED ORDER — KETOROLAC TROMETHAMINE 30 MG/ML IJ SOLN: 30.0000 mg | Freq: Once | INTRAMUSCULAR | Status: DC | PRN

## 2018-01-08 MED ORDER — FENTANYL CITRATE (PF) 250 MCG/5ML IJ SOLN
INTRAMUSCULAR | Status: DC | PRN
  Administered 2018-01-08: 50 ug via INTRAVENOUS
  Administered 2018-01-08: 150 ug via INTRAVENOUS
  Administered 2018-01-08: 50 ug via INTRAVENOUS

## 2018-01-08 MED ORDER — ROCURONIUM BROMIDE 10 MG/ML (PF) SYRINGE
PREFILLED_SYRINGE | INTRAVENOUS | Status: AC
  Filled 2018-01-08: qty 5

## 2018-01-08 MED ORDER — LIDOCAINE 2% (20 MG/ML) 5 ML SYRINGE
INTRAMUSCULAR | Status: DC | PRN
  Administered 2018-01-08: 1.5 mg/kg/h via INTRAVENOUS

## 2018-01-08 MED ORDER — SENNA 8.6 MG PO TABS
1.0000 | ORAL_TABLET | Freq: Every day | ORAL | Status: DC
  Administered 2018-01-08 – 2018-01-10 (×3): 8.6 mg via ORAL
  Filled 2018-01-08 (×3): qty 1

## 2018-01-08 MED ORDER — ENOXAPARIN SODIUM 40 MG/0.4ML ~~LOC~~ SOLN
40.0000 mg | SUBCUTANEOUS | Status: DC
  Administered 2018-01-09 – 2018-01-10 (×2): 40 mg via SUBCUTANEOUS
  Filled 2018-01-08 (×2): qty 0.4

## 2018-01-08 MED ORDER — KETAMINE HCL 10 MG/ML IJ SOLN
INTRAMUSCULAR | Status: AC
  Filled 2018-01-08: qty 1

## 2018-01-08 MED ORDER — MEPERIDINE HCL 50 MG/ML IJ SOLN: 6.2500 mg | INTRAMUSCULAR | Status: DC | PRN

## 2018-01-08 MED ORDER — 0.9 % SODIUM CHLORIDE (POUR BTL) OPTIME
TOPICAL | Status: DC | PRN
  Administered 2018-01-08: 2000 mL

## 2018-01-08 MED ORDER — ALUM & MAG HYDROXIDE-SIMETH 200-200-20 MG/5ML PO SUSP: 30.0000 mL | Freq: Four times a day (QID) | ORAL | Status: DC | PRN

## 2018-01-08 MED ORDER — BUPIVACAINE-EPINEPHRINE 0.25% -1:200000 IJ SOLN
INTRAMUSCULAR | Status: DC | PRN
  Administered 2018-01-08: 10 mL

## 2018-01-08 MED ORDER — MIDAZOLAM HCL 2 MG/2ML IJ SOLN
INTRAMUSCULAR | Status: AC
  Filled 2018-01-08: qty 2

## 2018-01-08 MED ORDER — DOCUSATE SODIUM 100 MG PO CAPS
200.0000 mg | ORAL_CAPSULE | Freq: Two times a day (BID) | ORAL | Status: DC
  Administered 2018-01-08 – 2018-01-10 (×4): 200 mg via ORAL
  Filled 2018-01-08 (×6): qty 2

## 2018-01-08 MED ORDER — CEFOTETAN DISODIUM-DEXTROSE 2-2.08 GM-%(50ML) IV SOLR
2.0000 g | INTRAVENOUS | Status: AC
  Administered 2018-01-08: 2 g via INTRAVENOUS
  Filled 2018-01-08: qty 50

## 2018-01-08 MED ORDER — ONDANSETRON HCL 4 MG PO TABS: 4.0000 mg | ORAL_TABLET | Freq: Four times a day (QID) | ORAL | Status: DC | PRN

## 2018-01-08 MED ORDER — ACETAMINOPHEN 500 MG PO TABS
1000.0000 mg | ORAL_TABLET | Freq: Four times a day (QID) | ORAL | Status: DC
  Administered 2018-01-08 – 2018-01-10 (×6): 1000 mg via ORAL
  Filled 2018-01-08 (×6): qty 2

## 2018-01-08 MED ORDER — SODIUM CHLORIDE 0.9 % IV SOLN
2.0000 g | Freq: Two times a day (BID) | INTRAVENOUS | Status: DC
  Filled 2018-01-08: qty 2

## 2018-01-08 MED ORDER — ONDANSETRON HCL 4 MG/2ML IJ SOLN: 4.0000 mg | Freq: Four times a day (QID) | INTRAMUSCULAR | Status: DC | PRN

## 2018-01-08 MED ORDER — PROMETHAZINE HCL 25 MG/ML IJ SOLN: 6.2500 mg | INTRAMUSCULAR | Status: DC | PRN

## 2018-01-08 MED ORDER — DEXAMETHASONE SODIUM PHOSPHATE 10 MG/ML IJ SOLN
INTRAMUSCULAR | Status: DC | PRN
  Administered 2018-01-08: 10 mg via INTRAVENOUS

## 2018-01-08 MED ORDER — KCL IN DEXTROSE-NACL 20-5-0.45 MEQ/L-%-% IV SOLN
INTRAVENOUS | Status: DC
  Administered 2018-01-08: 17:00:00 via INTRAVENOUS
  Filled 2018-01-08 (×2): qty 1000

## 2018-01-08 MED ORDER — ALVIMOPAN 12 MG PO CAPS
12.0000 mg | ORAL_CAPSULE | Freq: Two times a day (BID) | ORAL | Status: DC
  Filled 2018-01-08 (×2): qty 1

## 2018-01-08 MED ORDER — DIPHENHYDRAMINE HCL 12.5 MG/5ML PO ELIX: 12.5000 mg | ORAL_SOLUTION | Freq: Four times a day (QID) | ORAL | Status: DC | PRN

## 2018-01-08 MED ORDER — MIDAZOLAM HCL 5 MG/5ML IJ SOLN
INTRAMUSCULAR | Status: DC | PRN
  Administered 2018-01-08: 2 mg via INTRAVENOUS

## 2018-01-08 MED ORDER — SUGAMMADEX SODIUM 200 MG/2ML IV SOLN
INTRAVENOUS | Status: DC | PRN
  Administered 2018-01-08: 500 mg via INTRAVENOUS

## 2018-01-08 MED ORDER — PHENYLEPHRINE 40 MCG/ML (10ML) SYRINGE FOR IV PUSH (FOR BLOOD PRESSURE SUPPORT)
PREFILLED_SYRINGE | INTRAVENOUS | Status: DC | PRN
  Administered 2018-01-08 (×2): 80 ug via INTRAVENOUS

## 2018-01-08 MED ORDER — HYDROMORPHONE HCL 1 MG/ML IJ SOLN
0.5000 mg | INTRAMUSCULAR | Status: DC | PRN
  Administered 2018-01-09: 0.5 mg via INTRAVENOUS
  Filled 2018-01-08: qty 0.5

## 2018-01-08 MED ORDER — ONDANSETRON HCL 4 MG/2ML IJ SOLN
INTRAMUSCULAR | Status: AC
  Filled 2018-01-08: qty 2

## 2018-01-08 MED ORDER — HYDROMORPHONE HCL 1 MG/ML IJ SOLN
INTRAMUSCULAR | Status: AC
  Filled 2018-01-08: qty 1

## 2018-01-08 MED ORDER — ONDANSETRON HCL 4 MG/2ML IJ SOLN
INTRAMUSCULAR | Status: DC | PRN
  Administered 2018-01-08: 4 mg via INTRAVENOUS

## 2018-01-08 MED ORDER — KCL IN DEXTROSE-NACL 20-5-0.45 MEQ/L-%-% IV SOLN
INTRAVENOUS | Status: AC
  Filled 2018-01-08: qty 1000

## 2018-01-08 MED ORDER — SUGAMMADEX SODIUM 500 MG/5ML IV SOLN
INTRAVENOUS | Status: AC
  Filled 2018-01-08: qty 5

## 2018-01-08 MED ORDER — ACETAMINOPHEN 500 MG PO TABS
1000.0000 mg | ORAL_TABLET | ORAL | Status: AC
  Administered 2018-01-08: 1000 mg via ORAL
  Filled 2018-01-08: qty 2

## 2018-01-08 MED ORDER — BUPIVACAINE-EPINEPHRINE 0.25% -1:200000 IJ SOLN
INTRAMUSCULAR | Status: AC
  Filled 2018-01-08: qty 1

## 2018-01-08 MED ORDER — LIDOCAINE 2% (20 MG/ML) 5 ML SYRINGE
INTRAMUSCULAR | Status: AC
  Filled 2018-01-08: qty 5

## 2018-01-08 MED ORDER — HYDROMORPHONE HCL 1 MG/ML IJ SOLN
0.2500 mg | INTRAMUSCULAR | Status: DC | PRN
  Administered 2018-01-08 (×4): 0.5 mg via INTRAVENOUS

## 2018-01-08 MED ORDER — DEXAMETHASONE SODIUM PHOSPHATE 10 MG/ML IJ SOLN
INTRAMUSCULAR | Status: AC
  Filled 2018-01-08: qty 1

## 2018-01-08 SURGICAL SUPPLY — 57 items
BLADE EXTENDED COATED 6.5IN (ELECTRODE) IMPLANT
CELLS DAT CNTRL 66122 CELL SVR (MISCELLANEOUS) IMPLANT
CHLORAPREP W/TINT 26ML (MISCELLANEOUS) ×3 IMPLANT
COUNTER NEEDLE 20 DBL MAG RED (NEEDLE) ×3 IMPLANT
COVER MAYO STAND STRL (DRAPES) ×9 IMPLANT
DECANTER SPIKE VIAL GLASS SM (MISCELLANEOUS) ×3 IMPLANT
DERMABOND ADVANCED (GAUZE/BANDAGES/DRESSINGS) ×2
DERMABOND ADVANCED .7 DNX12 (GAUZE/BANDAGES/DRESSINGS) ×1 IMPLANT
DRAIN CHANNEL 19F RND (DRAIN) IMPLANT
DRAPE LAPAROSCOPIC ABDOMINAL (DRAPES) ×3 IMPLANT
DRSG OPSITE POSTOP 4X10 (GAUZE/BANDAGES/DRESSINGS) IMPLANT
DRSG OPSITE POSTOP 4X6 (GAUZE/BANDAGES/DRESSINGS) IMPLANT
DRSG OPSITE POSTOP 4X8 (GAUZE/BANDAGES/DRESSINGS) IMPLANT
ELECT PENCIL ROCKER SW 15FT (MISCELLANEOUS) ×6 IMPLANT
ELECT REM PT RETURN 15FT ADLT (MISCELLANEOUS) ×3 IMPLANT
EVACUATOR SILICONE 100CC (DRAIN) IMPLANT
GAUZE SPONGE 4X4 12PLY STRL (GAUZE/BANDAGES/DRESSINGS) ×3 IMPLANT
GLOVE BIO SURGEON STRL SZ 6.5 (GLOVE) ×4 IMPLANT
GLOVE BIO SURGEONS STRL SZ 6.5 (GLOVE) ×2
GLOVE BIOGEL PI IND STRL 7.0 (GLOVE) ×2 IMPLANT
GLOVE BIOGEL PI INDICATOR 7.0 (GLOVE) ×4
GOWN STRL REUS W/TWL 2XL LVL3 (GOWN DISPOSABLE) ×6 IMPLANT
GOWN STRL REUS W/TWL XL LVL3 (GOWN DISPOSABLE) ×12 IMPLANT
LEGGING LITHOTOMY PAIR STRL (DRAPES) IMPLANT
LUBRICANT JELLY K Y 4OZ (MISCELLANEOUS) ×3 IMPLANT
PACK COLON (CUSTOM PROCEDURE TRAY) ×3 IMPLANT
PAD POSITIONING PINK XL (MISCELLANEOUS) ×3 IMPLANT
PAD TELFA 2X3 NADH STRL (GAUZE/BANDAGES/DRESSINGS) ×3 IMPLANT
POSITIONER SURGICAL ARM (MISCELLANEOUS) ×3 IMPLANT
RTRCTR WOUND ALEXIS 18CM MED (MISCELLANEOUS)
SEALER TISSUE G2 STRG ARTC 35C (ENDOMECHANICALS) IMPLANT
SEALER TISSUE X1 CVD JAW (INSTRUMENTS) IMPLANT
SPONGE LAP 18X18 X RAY DECT (DISPOSABLE) ×9 IMPLANT
STAPLER PROXIMATE 75MM BLUE (STAPLE) ×3 IMPLANT
STAPLER VISISTAT 35W (STAPLE) ×3 IMPLANT
SUT ETHILON 2 0 PS N (SUTURE) IMPLANT
SUT NOVA NAB DX-16 0-1 5-0 T12 (SUTURE) ×9 IMPLANT
SUT NOVA NAB GS-21 0 18 T12 DT (SUTURE) ×6 IMPLANT
SUT PDS AB 1 CTX 36 (SUTURE) IMPLANT
SUT PDS AB 1 TP1 96 (SUTURE) IMPLANT
SUT PROLENE 2 0 KS (SUTURE) ×3 IMPLANT
SUT SILK 2 0 (SUTURE) ×2
SUT SILK 2 0 SH CR/8 (SUTURE) ×3 IMPLANT
SUT SILK 2-0 18XBRD TIE 12 (SUTURE) ×1 IMPLANT
SUT SILK 3 0 (SUTURE) ×2
SUT SILK 3 0 SH CR/8 (SUTURE) ×9 IMPLANT
SUT SILK 3-0 18XBRD TIE 12 (SUTURE) ×1 IMPLANT
SUT VIC AB 2-0 SH 18 (SUTURE) ×3 IMPLANT
SUT VIC AB 2-0 SH 27 (SUTURE) ×4
SUT VIC AB 2-0 SH 27X BRD (SUTURE) ×2 IMPLANT
SUT VIC AB 4-0 PS2 27 (SUTURE) ×3 IMPLANT
SYR CONTROL 10ML LL (SYRINGE) ×3 IMPLANT
TAPE CLOTH SURG 4X10 WHT LF (GAUZE/BANDAGES/DRESSINGS) ×3 IMPLANT
TOWEL OR NON WOVEN STRL DISP B (DISPOSABLE) ×3 IMPLANT
TRAY FOLEY W/METER SILVER 16FR (SET/KITS/TRAYS/PACK) IMPLANT
TUBING CONNECTING 10 (TUBING) ×2 IMPLANT
TUBING CONNECTING 10' (TUBING) ×1

## 2018-01-08 NOTE — Transfer of Care (Signed)
Immediate Anesthesia Transfer of Care Note  Patient: [name], DDS  Procedure(s) Performed: COLOSTOMY REVERSAL (N/A Abdomen)  Patient Location: PACU  Anesthesia Type:General  Level of Consciousness: sedated  Airway & Oxygen Therapy: Patient Spontanous Breathing and Patient connected to face mask oxygen  Post-op Assessment: Report given to RN and Post -op Vital signs reviewed and stable  Post vital signs: Reviewed and stable  Last Vitals:  Vitals:   01/08/18 1254  BP: (!) 147/86  Pulse: 82  Resp: 18  Temp: 36.4 C  SpO2: 100%    Last Pain:  Vitals:   01/08/18 1254  TempSrc: Oral      Patients Stated Pain Goal: 4 (85/50/15 8682)  Complications: No apparent anesthesia complications

## 2018-01-08 NOTE — Interval H&P Note (Signed)
History and Physical Interval Note:  01/08/2018 1:42 PM  [name], DDS  has presented today for surgery, with the diagnosis of rectal injury  The various methods of treatment have been discussed with the patient and family. After consideration of risks, benefits and other options for treatment, the patient has consented to  Procedure(s): COLOSTOMY REVERSAL (N/A) as a surgical intervention .  The patient's history has been reviewed, patient examined, no change in status, stable for surgery.  I have reviewed the patient's chart and labs.  Questions were answered to the patient's satisfaction.  The surgery and anatomy were described to the patient as well as the risks of surgery and the possible complications.  These include: Bleeding, deep abdominal infections and possible wound complications such as hernia and infection, damage to adjacent structures, leak of surgical connections, which can lead to other surgeries and possibly an ostomy, possible need for other procedures, such as abscess drains in radiology, possible prolonged hospital stay, prolonged fatigue/weakness or appetite loss, possible early recurrence of of disease, possible complications of their medical problems such as heart disease or arrhythmias or lung problems, death (less than 1%). I believe the patient understands and wishes to proceed with the surgery.    Rosario Adie, MD  [facility] [place] [facility]

## 2018-01-08 NOTE — Anesthesia Preprocedure Evaluation (Signed)
Anesthesia Evaluation  Patient identified by MRN, date of birth, ID band Patient awake    Reviewed: Allergy & Precautions, NPO status , Patient's Chart, lab work & pertinent test results  Airway Mallampati: II  TM Distance: >3 FB Neck ROM: Full    Dental no notable dental hx. (+) Teeth Intact   Pulmonary former smoker,    Pulmonary exam normal breath sounds clear to auscultation       Cardiovascular Normal cardiovascular exam Rhythm:Regular Rate:Normal  RBBB   Neuro/Psych negative neurological ROS  negative psych ROS   GI/Hepatic Neg liver ROS, GERD  Medicated,  Endo/Other  negative endocrine ROS  Renal/GU negative Renal ROS  negative genitourinary   Musculoskeletal negative musculoskeletal ROS (+)   Abdominal (+) + obese,   Peds negative pediatric ROS (+)  Hematology negative hematology ROS (+)   Anesthesia Other Findings   Reproductive/Obstetrics                             Anesthesia Physical  Anesthesia Plan  ASA: II  Anesthesia Plan: General   Post-op Pain Management:    Induction: Intravenous  PONV Risk Score and Plan: 3 and 4 or greater and Ondansetron, Dexamethasone, Midazolam and Treatment may vary due to age or medical condition  Airway Management Planned: Oral ETT  Additional Equipment:   Intra-op Plan:   Post-operative Plan: Extubation in OR  Informed Consent: I have reviewed the patients History and Physical, chart, labs and discussed the procedure including the risks, benefits and alternatives for the proposed anesthesia with the patient or authorized representative who has indicated his/her understanding and acceptance.   Dental advisory given  Plan Discussed with: CRNA and Surgeon  Anesthesia Plan Comments:         Anesthesia Quick Evaluation

## 2018-01-08 NOTE — Anesthesia Postprocedure Evaluation (Signed)
Anesthesia Post Note  Patient: Kristopher Thompson, DDS  Procedure(s) Performed: COLOSTOMY REVERSAL (N/A Abdomen)     Patient location during evaluation: PACU Anesthesia Type: General Level of consciousness: awake Pain management: pain level controlled Vital Signs Assessment: post-procedure vital signs reviewed and stable Respiratory status: spontaneous breathing Cardiovascular status: stable Postop Assessment: no apparent nausea or vomiting Anesthetic complications: no    Last Vitals:  Vitals:   01/08/18 1606 01/08/18 1700  BP: (!) 153/94 135/78  Pulse: 83 81  Resp: (!) 22 14  Temp: 36.6 C   SpO2: 100% 100%    Last Pain:  Vitals:   01/08/18 1700  TempSrc:   PainSc: Asleep   Pain Goal: Patients Stated Pain Goal: 4 (01/08/18 1307)               [place]

## 2018-01-08 NOTE — Op Note (Signed)
01/08/2018  3:52 PM  PATIENT:  Kristopher Thompson, DDS  67 y.o. male  Patient Care Team: Lorene Dy, MD as PCP - General (Internal Medicine)  PRE-OPERATIVE DIAGNOSIS:  Loop colostomy  POST-OPERATIVE DIAGNOSIS:  Loop colostomy  PROCEDURE:  COLOSTOMY REVERSAL    Surgeon(s): Leighton Ruff, MD Carlena Hurl, [place] Tamotsu Boston, MD  ASSISTANT: Dr Johney Maine   ANESTHESIA:   local and general  EBL: 148ml Total I/O In: 1200 [I.V.:1200] Out: 250 [Urine:100; Blood:150]  DRAINS: none   SPECIMEN:  No Specimen  DISPOSITION OF SPECIMEN:  N/A  COUNTS:  YES  PLAN OF CARE: Admit to inpatient   PATIENT DISPOSITION:  PACU - hemodynamically stable.  INDICATION: 67 y.o. M with loop colostomy after rectal repair   OR FINDINGS: loop colostomy, significant abdominal adhesions.    DESCRIPTION: the patient was identified in the preoperative holding area and taken to the OR where they were laid supine on the operating room table.  General anesthesia was induced without difficulty. SCDs were also noted to be in place prior to the initiation of anesthesia.  The patient was then prepped and draped in the usual sterile fashion.   A surgical timeout was performed indicating the correct patient, procedure, positioning and need for preoperative antibiotics.   I began by making an elliptical incision around the colostomy site using a 10 blade scalpel.  Dissection was carried down through subcutaneous tissues using electrocautery.  I dissected the 2 loops of the colostomy free from the abdominal fascia.  I freed up the approximately 1-2 cm of the fascia circumferentially.  I checked both loops to confirm no injury during dissection.  Once this was completed I resected the top of the ostomy and identified the 2 limbs internally.  I decided to staple a small opening in between the 2 using a GIA 75 mm stapler.  I stapled approximately 4 cm.  I then closed the common defect using interrupted 3-0 silk canal  sutures.  I used additional 3-0 silk interrupted sutures to close any defects in between the canal sutures.  A fatty tissue layer was closed over top of this using 3-0 silk interrupted sutures.  This was then placed back into the abdominal cavity.  Hemostasis was good.  The fascia was closed using interrupted #1 Novafil sutures.  The subcutaneous tissue was reapproximated using a 2-0 Vicryl pursestring suture.  The dermal layer was then closed using a running 2-0 Vicryl pursestring suture.  A Telfa wick was applied into the middle and a 4 x 4 sterile dressing was applied over this.  The patient tolerated this well and was sent to the postoperative care unit in stable condition.

## 2018-01-08 NOTE — Anesthesia Procedure Notes (Signed)
Procedure Name: Intubation Date/Time: 01/08/2018 2:17 PM Performed by: Talbot Grumbling, CRNA Pre-anesthesia Checklist: Patient identified, Emergency Drugs available, Suction available and Patient being monitored Patient Re-evaluated:Patient Re-evaluated prior to induction Oxygen Delivery Method: Circle system utilized Preoxygenation: Pre-oxygenation with 100% oxygen Induction Type: IV induction Ventilation: Mask ventilation without difficulty Laryngoscope Size: Glidescope (first attempt using Mac 4, then successful with LoPro 3) Grade View: Grade II Tube type: Oral Tube size: 7.5 mm Number of attempts: 2 (initial attempt esophageal intubation, noted immediately and reintubated with glidescope) Airway Equipment and Method: Stylet and Video-laryngoscopy Placement Confirmation: ETT inserted through vocal cords under direct vision,  positive ETCO2 and breath sounds checked- equal and bilateral Secured at: 23 cm Tube secured with: Tape Dental Injury: Teeth and Oropharynx as per pre-operative assessment  Difficulty Due To: Difficult Airway- due to immobile epiglottis

## 2018-01-09 ENCOUNTER — Encounter (HOSPITAL_COMMUNITY): Payer: Self-pay | Admitting: General Surgery

## 2018-01-09 LAB — BASIC METABOLIC PANEL
ANION GAP: 6 (ref 5–15)
BUN: 15 mg/dL (ref 6–20)
CALCIUM: 8.6 mg/dL — AB (ref 8.9–10.3)
CHLORIDE: 109 mmol/L (ref 101–111)
CO2: 23 mmol/L (ref 22–32)
CREATININE: 0.97 mg/dL (ref 0.61–1.24)
GFR calc Af Amer: >60 mL/min (ref >60)
GFR calc non Af Amer: >60 mL/min (ref >60)
Glucose, Bld: 127 mg/dL — ABNORMAL HIGH (ref 65–99)
Potassium: 4.1 mmol/L (ref 3.5–5.1)
SODIUM: 138 mmol/L (ref 135–145)

## 2018-01-09 LAB — CBC
HCT: 40.9 % (ref 39.0–52.0)
HEMOGLOBIN: 13.1 g/dL (ref 13.0–17.0)
MCH: 25 pg — ABNORMAL LOW (ref 26.0–34.0)
MCHC: 32 g/dL (ref 30.0–36.0)
MCV: 78.1 fL (ref 78.0–100.0)
PLATELETS: 185 10*3/uL (ref 150–400)
RBC: 5.24 MIL/uL (ref 4.22–5.81)
RDW: 15.4 % (ref 11.5–15.5)
WBC: 17.7 10*3/uL — AB (ref 4.0–10.5)

## 2018-01-09 MED ORDER — ORAL CARE MOUTH RINSE
15.0000 mL | Freq: Two times a day (BID) | OROMUCOSAL | Status: DC
  Administered 2018-01-09 – 2018-01-10 (×3): 15 mL via OROMUCOSAL

## 2018-01-09 NOTE — Progress Notes (Signed)
Nutrition Brief Note  Patient identified on the Malnutrition Screening Tool (MST) Report  Pt states he has had good appetite PTA and now. Pt consumed cream of wheat and orange sherbet with no issues this morning. He is eager to have diet advanced possibly today. Pt not interested in protein supplements at this time.  Pt states he is aware he lost weight in the past but expressed that he does not want to gain the weight back. Nutrition focused physical exam shows no sign of depletion of muscle mass or body fat.  RD available for questions if needed.  Wt Readings from Last 15 Encounters:  01/09/18 248 lb 12.8 oz (112.9 kg)  12/25/17 243 lb 4 oz (110.3 kg)  12/13/17 244 lb (110.7 kg)  10/14/17 244 lb 11.4 oz (111 kg)  08/09/17 268 lb 15.4 oz (122 kg)  08/02/17 268 lb (121.6 kg)  06/10/17 262 lb (118.8 kg)  06/04/17 262 lb (118.8 kg)  10/02/12 264 lb (119.7 kg)  09/26/12 260 lb (117.9 kg)    Body mass index is 33.74 kg/m. Patient meets criteria for obesity based on current BMI.   Current diet order is full liquid, patient is consuming approximately 100% of meals at this time. Labs and medications reviewed.   No nutrition interventions warranted at this time. If nutrition issues arise, please consult RD.   Clayton Bibles, MS, RD, [place] Dietitian Pager: [phone] After Hours Pager: [phone]

## 2018-01-09 NOTE — Progress Notes (Signed)
1 Day Post-Op loop colostomy reveral Subjective: No nausea, tolerating clears, pain controlled.  No flatus or BM  Objective: Vital signs in last 24 hours: Temp:  [97.6 F (36.4 C)-98.8 F (37.1 C)] 98 F (36.7 C) (02/28 0512) Pulse Rate:  [81-100] 81 (02/28 0512) Resp:  [14-22] 19 (02/28 0512) BP: (102-153)/(55-94) 114/55 (02/28 0512) SpO2:  [97 %-100 %] 98 % (02/28 0512) Weight:  [110.2 kg (243 lb)-112.9 kg (248 lb 12.8 oz)] 112.9 kg (248 lb 12.8 oz) (02/28 0512)   Intake/Output from previous day: 02/27 0701 - 02/28 0700 In: 1800 [I.V.:1750; IV Piggyback:50] Out: 2220 [Urine:2070; Blood:150] Intake/Output this shift: No intake/output data recorded.   General appearance: alert and cooperative GI: normal findings: soft, non-tender  Incision: dressing saturated  Lab Results:  Recent Labs    01/09/18 0409  WBC 17.7*  HGB 13.1  HCT 40.9  PLT 185   BMET Recent Labs    01/09/18 0409  NA 138  K 4.1  CL 109  CO2 23  GLUCOSE 127*  BUN 15  CREATININE 0.97  CALCIUM 8.6*   PT/INR No results for input(s): LABPROT, INR in the last 72 hours. ABG No results for input(s): PHART, HCO3 in the last 72 hours.  Invalid input(s): PCO2, PO2  MEDS, Scheduled . acetaminophen  1,000 mg Oral Q6H  . alvimopan  12 mg Oral BID  . docusate sodium  200 mg Oral BID  . enoxaparin (LOVENOX) injection  40 mg Subcutaneous Q24H  . mouth rinse  15 mL Mouth Rinse BID  . pantoprazole  40 mg Oral Daily  . saccharomyces boulardii  250 mg Oral BID  . senna  1 tablet Oral Daily    Studies/Results: No results found.  Assessment: s/p Procedure(s): COLOSTOMY REVERSAL Patient Active Problem List   Diagnosis Date Noted  . 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 Candida infection   . Postoperative intra-abdominal abscess   . Abscess   . Fever 10/09/2017  . Bacteremia due to Klebsiella pneumoniae 10/09/2017  . Urine leakage from surgical incision   . Sepsis ([place]) 10/04/2017  . GERD  (gastroesophageal reflux disease) 08/11/2017  . Obesity (BMI 30-39.9) 08/11/2017  . 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 08/08/2017 08/08/2017    Expected post op course  Plan: Advance diet as tolerated Change dressing Ambulate   LOS: 1 day     .Rosario Adie, [facility], [place] [phone]   01/09/2018 7:59 AM

## 2018-01-10 LAB — CBC
HCT: 39.4 % (ref 39.0–52.0)
Hemoglobin: 12.7 g/dL — ABNORMAL LOW (ref 13.0–17.0)
MCH: 25.3 pg — AB (ref 26.0–34.0)
MCHC: 32.2 g/dL (ref 30.0–36.0)
MCV: 78.6 fL (ref 78.0–100.0)
PLATELETS: 172 10*3/uL (ref 150–400)
RBC: 5.01 MIL/uL (ref 4.22–5.81)
RDW: 15.6 % — AB (ref 11.5–15.5)
WBC: 11.2 10*3/uL — ABNORMAL HIGH (ref 4.0–10.5)

## 2018-01-10 MED ORDER — IBUPROFEN 400 MG PO TABS: 200.0000 mg | ORAL_TABLET | Freq: Four times a day (QID) | ORAL | 0 refills | Status: DC | PRN

## 2018-01-10 MED ORDER — DOCUSATE SODIUM 100 MG PO CAPS
200.0000 mg | ORAL_CAPSULE | Freq: Two times a day (BID) | ORAL | 11 refills | Status: DC | PRN
Start: 2018-01-10 — End: 2018-03-21

## 2018-01-10 MED ORDER — HYDROCODONE-ACETAMINOPHEN 5-325 MG PO TABS: 1.0000 | ORAL_TABLET | ORAL | 0 refills | Status: DC | PRN

## 2018-01-10 MED ORDER — IBUPROFEN 200 MG PO TABS
400.0000 mg | ORAL_TABLET | Freq: Four times a day (QID) | ORAL | Status: DC | PRN
  Administered 2018-01-10: 400 mg via ORAL
  Filled 2018-01-10: qty 2

## 2018-01-10 MED ORDER — HYDROCODONE-ACETAMINOPHEN 5-325 MG PO TABS: 1.0000 | ORAL_TABLET | ORAL | Status: DC | PRN

## 2018-01-10 MED ORDER — SENNA 8.6 MG PO TABS: 1.0000 | ORAL_TABLET | Freq: Every day | ORAL | 0 refills | Status: DC | PRN

## 2018-01-10 NOTE — Discharge Summary (Signed)
Physician Discharge Summary  Patient ID: Kristopher Thompson, DDS MRN: [id number] DOB/AGE: [birth year] 67 y.o.  Admit date: 01/08/2018 Discharge date: 01/10/2018  Admission Diagnoses: Colostomy stricture  Discharge Diagnoses:  Active Problems:   Colostomy dysfunction [facility])   Discharged Condition: good  Hospital Course: Pt admitted after surgery.  His diet was advanced as tolerated.  He was discharged to home once tolerating a diet, having bowel function and PO narcotics.    Consults: None  Significant Diagnostic Studies: labs: cbc, bmet  Treatments: IV hydration, analgesia: acetaminophen and surgery: colostomy reversal  Discharge Exam: Blood pressure (!) 100/54, pulse 87, temperature 99.6 F (37.6 C), temperature source Oral, resp. rate 18, height 6' (1.829 m), weight 112.8 kg (248 lb 10.9 oz), SpO2 98 %. General appearance: alert and cooperative CX:KGYJ, non-distended Incision/Wound: no infection, draining appropriately   Disposition: 01-Home or Self Care   Allergies as of 01/10/2018      Reactions   Demerol [meperidine] Other (See Comments)   SEVERE NAUSEA      Medication List    TAKE these medications   docusate sodium 100 MG capsule Commonly known as:  COLACE Take 2 capsules (200 mg total) by mouth 2 (two) times daily as needed for mild constipation. What changed:    when to take this  reasons to take this   esomeprazole 20 MG capsule Commonly known as:  NEXIUM Take 20 mg by mouth daily as needed (heartburn).   HYDROcodone-acetaminophen 5-325 MG tablet Commonly known as:  NORCO/VICODIN Take 1-2 tablets by mouth every 4 (four) hours as needed for moderate pain or severe pain.   ibuprofen 400 MG tablet Commonly known as:  ADVIL,MOTRIN Take 0.5-1 tablets (200-400 mg total) by mouth every 6 (six) hours as needed for fever, headache or mild pain.   senna 8.6 MG Tabs tablet Commonly known as:  SENOKOT Take 1 tablet (8.6 mg total) by mouth daily as needed for  mild constipation. What changed:    when to take this  reasons to take this      Follow-up Information    Leighton Ruff, MD. Schedule an appointment as soon as possible for a visit in 2 week(s).   Specialty:  General Surgery Contact information: [address] [place] [address] [phone]           Signed: Rosario Adie 06/19/5026, 7:41 AM

## 2018-01-10 NOTE — Discharge Instructions (Signed)
ABDOMINAL SURGERY: POST OP INSTRUCTIONS  1. DIET: Follow a light bland diet the first 24 hours after arrival home, such as soup, liquids, crackers, etc.  Be sure to include lots of fluids daily.  Avoid fast food or heavy meals as your are more likely to get nauseated.  Do not eat any uncooked fruits or vegetables for the next 2 weeks as your colon heals. 2. Take your usually prescribed home medications unless otherwise directed. 3. PAIN CONTROL: a. Pain is best controlled by a usual combination of three different methods TOGETHER: i. Ice/Heat ii. Over the counter pain medication iii. Prescription pain medication b. Most patients will experience some swelling and bruising around the incisions.  Ice packs or heating pads (30-60 minutes up to 6 times a day) will help. Use ice for the first few days to help decrease swelling and bruising, then switch to heat to help relax tight/sore spots and speed recovery.  Some people prefer to use ice alone, heat alone, alternating between ice & heat.  Experiment to what works for you.  Swelling and bruising can take several weeks to resolve.   c. It is helpful to take an over-the-counter pain medication regularly for the first few weeks.  Choose one of the following that works best for you: i. Naproxen (Aleve, etc)  Two 220mg  tabs twice a day ii. Ibuprofen (Advil, etc) Three 200mg  tabs four times a day (every meal & bedtime) iii. Acetaminophen (Tylenol, etc) 500-650mg  four times a day (every meal & bedtime) d. A  prescription for pain medication (such as oxycodone, hydrocodone, etc) should be given to you upon discharge.  Take your pain medication as prescribed.  i. If you are having problems/concerns with the prescription medicine (does not control pain, nausea, vomiting, rash, itching, etc), please call us [phone] to see if we need to switch you to a different pain medicine that will work better for you and/or control your side effect better. ii. If you  need a refill on your pain medication, please contact your pharmacy.  They will contact our office to request authorization. Prescriptions will not be filled after 5 pm or on week-ends. 4. Avoid getting constipated.  Between the surgery and the pain medications, it is common to experience some constipation.  Increasing fluid intake and taking a fiber supplement (such as Metamucil, Citrucel, FiberCon, MiraLax, etc) 1-2 times a day regularly will usually help prevent this problem from occurring.  A mild laxative (prune juice, Milk of Magnesia, MiraLax, etc) should be taken according to package directions if there are no bowel movements after 48 hours.   5. Watch out for diarrhea.  If you have many loose bowel movements, simplify your diet to bland foods & liquids for a few days.  Stop any stool softeners and decrease your fiber supplement.  Switching to mild anti-diarrheal medications (Kayopectate, Pepto Bismol) can help.  If this worsens or does not improve, please call us. 6. Wash / shower every day.  You may shower over the incision / wound.  Avoid baths until the skin is fully healed.  Continue to shower over incision(s) after the dressing is off. 7. Remove your bandage daily.  Wash with soap and water.  Change daily and as needed.   8. ACTIVITIES as tolerated:   a. You may resume regular (light) daily activities beginning the next day--such as daily self-care, walking, climbing stairs--gradually increasing activities as tolerated.  If you can walk 30 minutes without difficulty, it is safe to  try more intense activity such as jogging, treadmill, bicycling, low-impact aerobics, swimming, etc. b. Save the most intensive and strenuous activity for last such as sit-ups, heavy lifting, contact sports, etc  Refrain from any heavy lifting or straining until you are off narcotics for pain control.   c. DO NOT PUSH THROUGH PAIN.  Let pain be your guide: If it hurts to do something, don't do it.  Pain is your body  warning you to avoid that activity for another week until the pain goes down. d. You may drive when you are no longer taking prescription pain medication, you can comfortably wear a seatbelt, and you can safely maneuver your car and apply brakes. e. Dennis Bast may have sexual intercourse when it is comfortable.  9. FOLLOW UP in our office a. Please call [facility] at [phone] to set up an appointment to see your surgeon in the office for a follow-up appointment approximately 1-2 weeks after your surgery. b. Make sure that you call for this appointment the day you arrive home to insure a convenient appointment time. 10. IF YOU HAVE DISABILITY OR FAMILY LEAVE FORMS, BRING THEM TO THE OFFICE FOR PROCESSING.  DO NOT GIVE THEM TO YOUR DOCTOR.   WHEN TO CALL us [phone]: 1. Poor pain control 2. Reactions / problems with new medications (rash/itching, nausea, etc)  3. Fever over 101.5 F (38.5 C) 4. Inability to urinate 5. Nausea and/or vomiting 6. Worsening swelling or bruising 7. Continued bleeding from incision. 8. Increased pain, redness, or drainage from the incision  The clinic staff is available to answer your questions during regular business hours (8:30am-5pm).  Please dont hesitate to call and ask to speak to one of our nurses for clinical concerns.   A surgeon from [facility] is always on call at the hospitals   If you have a medical emergency, go to the nearest emergency room or call 911.    [facility], [place], [place], [place], [place]  [address] ? MAIN: [phone] ? TOLL FREE: [phone] ? FAX [phone] [email or web address]

## 2018-01-17 ENCOUNTER — Ambulatory Visit (HOSPITAL_COMMUNITY)
Admission: RE | Admit: 2018-01-17 | Discharge: 2018-01-17 | Disposition: A | Discharge status: Home / Self Care | Payer: Medicare Other | Source: Ambulatory Visit | Attending: Interventional Radiology | Admitting: Interventional Radiology

## 2018-01-17 ENCOUNTER — Encounter (HOSPITAL_COMMUNITY): Payer: Self-pay | Admitting: Interventional Radiology

## 2018-01-17 ENCOUNTER — Other Ambulatory Visit (HOSPITAL_COMMUNITY): Payer: Self-pay | Admitting: Interventional Radiology

## 2018-01-17 DIAGNOSIS — N135 Crossing vessel and stricture of ureter without hydronephrosis: Secondary | ICD-10-CM

## 2018-01-17 DIAGNOSIS — Z436 Encounter for attention to other artificial openings of urinary tract: Secondary | ICD-10-CM | POA: Diagnosis not present

## 2018-01-17 HISTORY — PX: IR CATHETER TUBE CHANGE: IMG717

## 2018-01-17 MED ORDER — IOPAMIDOL (ISOVUE-300) INJECTION 61%
INTRAVENOUS | Status: AC
  Administered 2018-01-17: 10 mL
  Filled 2018-01-17: qty 50

## 2018-01-17 MED ORDER — IOPAMIDOL (ISOVUE-300) INJECTION 61%
10.0000 mL | Freq: Once | INTRAVENOUS | Status: AC | PRN
  Administered 2018-01-17: 10 mL

## 2018-01-17 NOTE — Procedures (Signed)
Interventional Radiology Procedure Note  Procedure: Exchange of left nephroureteral catheter via ileal conduit  Complications: None  Estimated Blood Loss: None  Findings: Left nephroureteral catheter upsized to 12 Fr.  Venetia Night. Kathlene Cote, M.D Pager:  [phone]

## 2018-02-07 DIAGNOSIS — C679 Malignant neoplasm of bladder, unspecified: Secondary | ICD-10-CM | POA: Diagnosis not present

## 2018-02-07 DIAGNOSIS — R918 Other nonspecific abnormal finding of lung field: Secondary | ICD-10-CM | POA: Diagnosis not present

## 2018-02-07 DIAGNOSIS — C678 Malignant neoplasm of overlapping sites of bladder: Secondary | ICD-10-CM | POA: Diagnosis not present

## 2018-02-12 DIAGNOSIS — C678 Malignant neoplasm of overlapping sites of bladder: Secondary | ICD-10-CM | POA: Diagnosis not present

## 2018-02-12 DIAGNOSIS — C61 Malignant neoplasm of prostate: Secondary | ICD-10-CM | POA: Diagnosis not present

## 2018-02-27 NOTE — Patient Instructions (Signed)
Left voicemail for patient to arrive at 0800 in Interventional Radiology at [facility] for appointment on 02/28/18.

## 2018-02-28 ENCOUNTER — Other Ambulatory Visit (HOSPITAL_COMMUNITY): Payer: Self-pay | Admitting: Urology

## 2018-02-28 ENCOUNTER — Ambulatory Visit (HOSPITAL_COMMUNITY)
Admission: RE | Admit: 2018-02-28 | Discharge: 2018-02-28 | Disposition: A | Discharge status: Home / Self Care | Payer: Medicare Other | Source: Ambulatory Visit | Attending: Interventional Radiology | Admitting: Interventional Radiology

## 2018-02-28 ENCOUNTER — Encounter (HOSPITAL_COMMUNITY): Payer: Self-pay | Admitting: Interventional Radiology

## 2018-02-28 DIAGNOSIS — I4510 Unspecified right bundle-branch block: Secondary | ICD-10-CM | POA: Diagnosis not present

## 2018-02-28 DIAGNOSIS — T83510A Infection and inflammatory reaction due to cystostomy catheter, initial encounter: Secondary | ICD-10-CM | POA: Diagnosis not present

## 2018-02-28 DIAGNOSIS — T83038A Leakage of other indwelling urethral catheter, initial encounter: Secondary | ICD-10-CM | POA: Insufficient documentation

## 2018-02-28 DIAGNOSIS — N135 Crossing vessel and stricture of ureter without hydronephrosis: Secondary | ICD-10-CM

## 2018-02-28 DIAGNOSIS — R42 Dizziness and giddiness: Secondary | ICD-10-CM | POA: Diagnosis not present

## 2018-02-28 DIAGNOSIS — Y733 Surgical instruments, materials and gastroenterology and urology devices (including sutures) associated with adverse incidents: Secondary | ICD-10-CM | POA: Insufficient documentation

## 2018-02-28 DIAGNOSIS — Z906 Acquired absence of other parts of urinary tract: Secondary | ICD-10-CM

## 2018-02-28 DIAGNOSIS — Z933 Colostomy status: Secondary | ICD-10-CM | POA: Diagnosis not present

## 2018-02-28 DIAGNOSIS — A419 Sepsis, unspecified organism: Secondary | ICD-10-CM | POA: Diagnosis not present

## 2018-02-28 DIAGNOSIS — R509 Fever, unspecified: Secondary | ICD-10-CM | POA: Diagnosis not present

## 2018-02-28 DIAGNOSIS — N9989 Other postprocedural complications and disorders of genitourinary system: Secondary | ICD-10-CM | POA: Diagnosis not present

## 2018-02-28 DIAGNOSIS — K5730 Diverticulosis of large intestine without perforation or abscess without bleeding: Secondary | ICD-10-CM | POA: Diagnosis not present

## 2018-02-28 DIAGNOSIS — C679 Malignant neoplasm of bladder, unspecified: Secondary | ICD-10-CM | POA: Insufficient documentation

## 2018-02-28 DIAGNOSIS — K449 Diaphragmatic hernia without obstruction or gangrene: Secondary | ICD-10-CM | POA: Diagnosis not present

## 2018-02-28 DIAGNOSIS — N390 Urinary tract infection, site not specified: Secondary | ICD-10-CM | POA: Diagnosis not present

## 2018-02-28 DIAGNOSIS — R531 Weakness: Secondary | ICD-10-CM | POA: Diagnosis not present

## 2018-02-28 HISTORY — PX: IR CATHETER TUBE CHANGE: IMG717

## 2018-02-28 MED ORDER — IOPAMIDOL (ISOVUE-300) INJECTION 61%
INTRAVENOUS | Status: AC
  Administered 2018-02-28: 15 mL
  Filled 2018-02-28: qty 50

## 2018-02-28 MED ORDER — IOPAMIDOL (ISOVUE-300) INJECTION 61%
50.0000 mL | Freq: Once | INTRAVENOUS | Status: AC | PRN
  Administered 2018-02-28: 15 mL

## 2018-03-01 ENCOUNTER — Emergency Department (HOSPITAL_COMMUNITY): Payer: Medicare Other

## 2018-03-01 ENCOUNTER — Encounter (HOSPITAL_COMMUNITY): Payer: Self-pay | Admitting: *Deleted

## 2018-03-01 ENCOUNTER — Inpatient Hospital Stay (HOSPITAL_COMMUNITY)
Admission: EM | Admit: 2018-03-01 | Discharge: 2018-03-06 | DRG: 698 | Disposition: A | Discharge status: Home Health | Payer: Medicare Other | Attending: Internal Medicine | Admitting: Internal Medicine

## 2018-03-01 DIAGNOSIS — Z885 Allergy status to narcotic agent status: Secondary | ICD-10-CM

## 2018-03-01 DIAGNOSIS — Z936 Other artificial openings of urinary tract status: Secondary | ICD-10-CM

## 2018-03-01 DIAGNOSIS — K5730 Diverticulosis of large intestine without perforation or abscess without bleeding: Secondary | ICD-10-CM | POA: Diagnosis not present

## 2018-03-01 DIAGNOSIS — Z87891 Personal history of nicotine dependence: Secondary | ICD-10-CM | POA: Diagnosis not present

## 2018-03-01 DIAGNOSIS — R42 Dizziness and giddiness: Secondary | ICD-10-CM | POA: Diagnosis not present

## 2018-03-01 DIAGNOSIS — Z8551 Personal history of malignant neoplasm of bladder: Secondary | ICD-10-CM | POA: Diagnosis not present

## 2018-03-01 DIAGNOSIS — I10 Essential (primary) hypertension: Secondary | ICD-10-CM | POA: Diagnosis present

## 2018-03-01 DIAGNOSIS — C670 Malignant neoplasm of trigone of bladder: Secondary | ICD-10-CM | POA: Diagnosis not present

## 2018-03-01 DIAGNOSIS — C679 Malignant neoplasm of bladder, unspecified: Secondary | ICD-10-CM | POA: Diagnosis not present

## 2018-03-01 DIAGNOSIS — Z933 Colostomy status: Secondary | ICD-10-CM

## 2018-03-01 DIAGNOSIS — N390 Urinary tract infection, site not specified: Secondary | ICD-10-CM | POA: Diagnosis present

## 2018-03-01 DIAGNOSIS — R509 Fever, unspecified: Secondary | ICD-10-CM | POA: Diagnosis not present

## 2018-03-01 DIAGNOSIS — Y733 Surgical instruments, materials and gastroenterology and urology devices (including sutures) associated with adverse incidents: Secondary | ICD-10-CM | POA: Diagnosis present

## 2018-03-01 DIAGNOSIS — K449 Diaphragmatic hernia without obstruction or gangrene: Secondary | ICD-10-CM | POA: Diagnosis present

## 2018-03-01 DIAGNOSIS — A419 Sepsis, unspecified organism: Secondary | ICD-10-CM | POA: Diagnosis not present

## 2018-03-01 DIAGNOSIS — K219 Gastro-esophageal reflux disease without esophagitis: Secondary | ICD-10-CM | POA: Diagnosis present

## 2018-03-01 DIAGNOSIS — I4510 Unspecified right bundle-branch block: Secondary | ICD-10-CM | POA: Diagnosis present

## 2018-03-01 DIAGNOSIS — Z9049 Acquired absence of other specified parts of digestive tract: Secondary | ICD-10-CM | POA: Diagnosis not present

## 2018-03-01 DIAGNOSIS — Z801 Family history of malignant neoplasm of trachea, bronchus and lung: Secondary | ICD-10-CM | POA: Diagnosis not present

## 2018-03-01 DIAGNOSIS — T83038A Leakage of other indwelling urethral catheter, initial encounter: Secondary | ICD-10-CM | POA: Diagnosis present

## 2018-03-01 DIAGNOSIS — Z8546 Personal history of malignant neoplasm of prostate: Secondary | ICD-10-CM | POA: Diagnosis not present

## 2018-03-01 DIAGNOSIS — Z906 Acquired absence of other parts of urinary tract: Secondary | ICD-10-CM

## 2018-03-01 DIAGNOSIS — T83510A Infection and inflammatory reaction due to cystostomy catheter, initial encounter: Secondary | ICD-10-CM | POA: Diagnosis present

## 2018-03-01 DIAGNOSIS — Z8249 Family history of ischemic heart disease and other diseases of the circulatory system: Secondary | ICD-10-CM

## 2018-03-01 DIAGNOSIS — Z9079 Acquired absence of other genital organ(s): Secondary | ICD-10-CM

## 2018-03-01 DIAGNOSIS — R531 Weakness: Secondary | ICD-10-CM | POA: Diagnosis not present

## 2018-03-01 HISTORY — DX: Disorder of kidney and ureter, unspecified: N28.9

## 2018-03-01 LAB — URINALYSIS, ROUTINE W REFLEX MICROSCOPIC
BILIRUBIN URINE: NEGATIVE
Glucose, UA: NEGATIVE mg/dL
Ketones, ur: NEGATIVE mg/dL
Nitrite: NEGATIVE
PROTEIN: 100 mg/dL — AB
SPECIFIC GRAVITY, URINE: 1.015 (ref 1.005–1.030)
SQUAMOUS EPITHELIAL / LPF: NONE SEEN
pH: 7 (ref 5.0–8.0)

## 2018-03-01 LAB — COMPREHENSIVE METABOLIC PANEL
ALT: 16 U/L — ABNORMAL LOW (ref 17–63)
ANION GAP: 10 (ref 5–15)
AST: 15 U/L (ref 15–41)
Albumin: 4.1 g/dL (ref 3.5–5.0)
Alkaline Phosphatase: 65 U/L (ref 38–126)
BUN: 18 mg/dL (ref 6–20)
CHLORIDE: 105 mmol/L (ref 101–111)
CO2: 22 mmol/L (ref 22–32)
Calcium: 9 mg/dL (ref 8.9–10.3)
Creatinine, Ser: 1.04 mg/dL (ref 0.61–1.24)
Glucose, Bld: 112 mg/dL — ABNORMAL HIGH (ref 65–99)
Potassium: 3.7 mmol/L (ref 3.5–5.1)
SODIUM: 137 mmol/L (ref 135–145)
Total Bilirubin: 1 mg/dL (ref 0.3–1.2)
Total Protein: 7.8 g/dL (ref 6.5–8.1)

## 2018-03-01 LAB — CBC WITH DIFFERENTIAL/PLATELET
BASOS PCT: 0 %
Basophils Absolute: 0 10*3/uL (ref 0.0–0.1)
EOS ABS: 0.1 10*3/uL (ref 0.0–0.7)
Eosinophils Relative: 1 %
HEMATOCRIT: 42.8 % (ref 39.0–52.0)
HEMOGLOBIN: 13.8 g/dL (ref 13.0–17.0)
LYMPHS ABS: 1.2 10*3/uL (ref 0.7–4.0)
Lymphocytes Relative: 8 %
MCH: 24.5 pg — AB (ref 26.0–34.0)
MCHC: 32.2 g/dL (ref 30.0–36.0)
MCV: 75.9 fL — ABNORMAL LOW (ref 78.0–100.0)
MONOS PCT: 12 %
Monocytes Absolute: 1.8 10*3/uL — ABNORMAL HIGH (ref 0.1–1.0)
NEUTROS ABS: 12.4 10*3/uL — AB (ref 1.7–7.7)
NEUTROS PCT: 79 %
Platelets: 188 10*3/uL (ref 150–400)
RBC: 5.64 MIL/uL (ref 4.22–5.81)
RDW: 15.4 % (ref 11.5–15.5)
WBC: 15.5 10*3/uL — AB (ref 4.0–10.5)

## 2018-03-01 LAB — I-STAT CG4 LACTIC ACID, ED: LACTIC ACID, VENOUS: 1.39 mmol/L (ref 0.5–1.9)

## 2018-03-01 MED ORDER — ONDANSETRON HCL 4 MG PO TABS: 4.0000 mg | ORAL_TABLET | Freq: Four times a day (QID) | ORAL | Status: DC | PRN

## 2018-03-01 MED ORDER — PANTOPRAZOLE SODIUM 40 MG PO TBEC
40.0000 mg | DELAYED_RELEASE_TABLET | Freq: Every day | ORAL | Status: DC
  Administered 2018-03-02 – 2018-03-06 (×5): 40 mg via ORAL
  Filled 2018-03-01 (×5): qty 1

## 2018-03-01 MED ORDER — SODIUM CHLORIDE 0.9 % IV SOLN
2.0000 g | Freq: Once | INTRAVENOUS | Status: AC
  Administered 2018-03-01: 2 g via INTRAVENOUS

## 2018-03-01 MED ORDER — IOPAMIDOL (ISOVUE-300) INJECTION 61%
100.0000 mL | Freq: Once | INTRAVENOUS | Status: AC | PRN
  Administered 2018-03-01: 100 mL via INTRAVENOUS

## 2018-03-01 MED ORDER — CEFEPIME HCL 2 G IJ SOLR
2.0000 g | Freq: Three times a day (TID) | INTRAMUSCULAR | Status: DC
  Administered 2018-03-02 – 2018-03-05 (×10): 2 g via INTRAVENOUS
  Filled 2018-03-01 (×11): qty 2

## 2018-03-01 MED ORDER — SODIUM CHLORIDE 0.9 % IV SOLN
INTRAVENOUS | Status: AC
  Administered 2018-03-02: 01:00:00 via INTRAVENOUS

## 2018-03-01 MED ORDER — ACETAMINOPHEN 500 MG PO TABS
1000.0000 mg | ORAL_TABLET | Freq: Once | ORAL | Status: AC
  Administered 2018-03-01: 1000 mg via ORAL
  Filled 2018-03-01: qty 2

## 2018-03-01 MED ORDER — ONDANSETRON HCL 4 MG/2ML IJ SOLN: 4.0000 mg | Freq: Four times a day (QID) | INTRAMUSCULAR | Status: DC | PRN

## 2018-03-01 MED ORDER — ACETAMINOPHEN 650 MG RE SUPP: 650.0000 mg | Freq: Four times a day (QID) | RECTAL | Status: DC | PRN

## 2018-03-01 MED ORDER — IOPAMIDOL (ISOVUE-300) INJECTION 61%
INTRAVENOUS | Status: AC
  Filled 2018-03-01: qty 100

## 2018-03-01 MED ORDER — ACETAMINOPHEN 325 MG PO TABS
650.0000 mg | ORAL_TABLET | Freq: Four times a day (QID) | ORAL | Status: DC | PRN
  Administered 2018-03-02 – 2018-03-03 (×3): 650 mg via ORAL
  Filled 2018-03-01 (×3): qty 2

## 2018-03-01 MED ORDER — SODIUM CHLORIDE 0.9 % IV BOLUS
1000.0000 mL | Freq: Once | INTRAVENOUS | Status: AC
  Administered 2018-03-01: 1000 mL via INTRAVENOUS

## 2018-03-01 MED ORDER — SODIUM CHLORIDE 0.9 % IV SOLN
2.0000 g | Freq: Once | INTRAVENOUS | Status: DC
  Filled 2018-03-01: qty 2

## 2018-03-01 MED ORDER — POLYETHYLENE GLYCOL 3350 17 G PO PACK: 17.0000 g | PACK | Freq: Every day | ORAL | Status: DC | PRN

## 2018-03-01 MED ORDER — HYDROCODONE-ACETAMINOPHEN 5-325 MG PO TABS: 1.0000 | ORAL_TABLET | ORAL | Status: DC | PRN

## 2018-03-01 NOTE — ED Notes (Signed)
ED Provider at bedside. 

## 2018-03-01 NOTE — Progress Notes (Signed)
A consult was received from an ED physician for cefepime per pharmacy dosing.  The patient's profile has been reviewed for ht/wt/allergies/indication/available labs.   A one time order has been placed for cefepime 2 gm.    Further antibiotics/pharmacy consults should be ordered by admitting physician if indicated.                       Thank you, Eudelia Bunch, Pharm.D. 03/01/2018 6:55 PM

## 2018-03-01 NOTE — ED Notes (Signed)
ED PA at bedside

## 2018-03-01 NOTE — H&P (Signed)
Kristopher Thompson ATF:[id number] DOB: [birth year] DOA: 03/01/2018     PCP: Lorene Dy, MD  Have not been seen for years Outpatient Specialists:   Surgery Dr.  Marcello Moores Urology Dr.Borden Patient arrived to ER on 03/01/18 at 1637  Patient coming from:  home Lives   With family    Chief Complaint:  Chief Complaint  Patient presents with  . Urinary stent problems  . Fever    HPI: Kristopher Thompson is a 67 y.o. male with medical history significant of bladder/ prostate cancer implicated by postoperative intra-abdominal abscess bacteremia secondary to Klebsiella pneumonia urine leakage from surgical incision recurrent sepsis.  Rectal tear requiring colostomy and then colostomy reversal    Presented with urostomy changed yesterday by IR seem to have had trouble placing.  Became fatigued and febrile after procedure yesterday.  Febrile up to 103.8 at home last night he had some Reiger's and generalized fatigue no cough.  Patient attempted to go back to work yesterday but was so tired he had to crawl severely lightheaded he feels like he is about a fall over every time he tries to ambulate.   Regarding pertinent Chronic problems: History of bladder prostate cancer   s/p cystoprostatectomy/ileal conduit rope with abdominal perineal resection with rectal tear colostomy after rectal injury 08/08/17; now with delayed urine leak, s/p urinoma drain placement on 10/04/17 by IR  s/p left PCN 10/07/17 and nephrostogram with  ureteral stent placement on 10/24/2017 by IR   History of sepsis secondary to Klebsiella pneumonia  Hx of mild HTN not on any medications  While in ER:  now feeling better after IV fluid resuscitation.   Following Medications were ordered in ER: Medications  acetaminophen (TYLENOL) tablet 1,000 mg (1,000 mg Oral Given 03/01/18 1900)  sodium chloride 0.9 % bolus 1,000 mL (0 mLs Intravenous Stopped 03/01/18 2004)  ceFEPIme (MAXIPIME) 2 g in sodium chloride 0.9 % 100 mL IVPB  (0 g Intravenous Stopped 03/01/18 2004)  iopamidol (ISOVUE-300) 61 % injection 100 mL ( Intravenous Canceled Entry 03/01/18 1916)    Significant initial  Findings: Abnormal Labs Reviewed  COMPREHENSIVE METABOLIC PANEL - Abnormal; Notable for the following components:      Result Value   Glucose, Bld 112 (*)    ALT 16 (*)    All other components within normal limits  CBC WITH DIFFERENTIAL/PLATELET - Abnormal; Notable for the following components:   WBC 15.5 (*)    MCV 75.9 (*)    MCH 24.5 (*)    Neutro Abs 12.4 (*)    Monocytes Absolute 1.8 (*)    All other components within normal limits  URINALYSIS, ROUTINE W REFLEX MICROSCOPIC - Abnormal; Notable for the following components:   APPearance CLOUDY (*)    Hgb urine dipstick LARGE (*)    Protein, ur 100 (*)    Leukocytes, UA LARGE (*)    Bacteria, UA MANY (*)    All other components within normal limits     Na  137 K 3.7  Cr   Stable,  Lab Results  Component Value Date   CREATININE 1.04 03/01/2018   CREATININE 0.97 01/09/2018   CREATININE 0.81 12/25/2017      WBC  15.5  HG/HCT   stable,      Component Value Date/Time   HGB 13.8 03/01/2018 1724   HCT 42.8 03/01/2018 1724    Lactic Acid, Venous    Component Value Date/Time   LATICACIDVEN 1.39 03/01/2018 1731  UA  evidence of UTI       CXR - NON acute  CTabd/pelvis -unremarkable appearance of the stent and ileal conduit left kidney following urinated to her stomach exchange no abscess  ECG:  Personally reviewed by me showing: HR : 99 Rhythm: icomplete right bundle branch block  nonspecific changes  QTC 501     ED Triage Vitals  Enc Vitals Group     BP 03/01/18 1649 (!) 148/70     Pulse Rate 03/01/18 1649 (!) 102     Resp 03/01/18 1649 18     Temp 03/01/18 1649 99.8 F (37.7 C)     Temp Source 03/01/18 1649 Oral     SpO2 03/01/18 1649 100 %     Weight 03/01/18 1659 215 lb (97.5 kg)     Height 03/01/18 1659 6' (1.829 m)     Head  Circumference --      Peak Flow --      Pain Score 03/01/18 1659 3     Pain Loc --      Pain Edu? --      Excl. in [place]? --   TMAX(24)@       Latest  Blood pressure 133/75, pulse 100, temperature (!) 103.9 F (39.9 C), temperature source Rectal, resp. rate (!) 23, height 6' (1.829 m), weight 97.5 kg (215 lb), SpO2 97 %.    ER Provider Called:     IR They Recommend admit to medicine and treat underlying infection Will see in AM   Hospitalist was called for admission for sepsis secondary to UTI in the setting of recent Urostomy change  Review of Systems:    Pertinent positives include: Fevers, chills, fatigue,  Constitutional:  No weight loss, night sweats,  weight loss  HEENT:  No headaches, Difficulty swallowing,Tooth/dental problems,Sore throat,  No sneezing, itching, ear ache, nasal congestion, post nasal drip,  Cardio-vascular:  No chest pain, Orthopnea, PND, anasarca, dizziness, palpitations.no Bilateral lower extremity swelling  GI:  No heartburn, indigestion, abdominal pain, nausea, vomiting, diarrhea, change in bowel habits, loss of appetite, melena, blood in stool, hematemesis Resp:  no shortness of breath at rest. No dyspnea on exertion, No excess mucus, no productive cough, No non-productive cough, No coughing up of blood.No change in color of mucus.No wheezing. Skin:  no rash or lesions. No jaundice GU:  no dysuria, change in color of urine, no urgency or frequency. No straining to urinate.  No flank pain.  Musculoskeletal:  No joint pain or no joint swelling. No decreased range of motion. No back pain.  Psych:  No change in mood or affect. No depression or anxiety. No memory loss.  Neuro: no localizing neurological complaints, no tingling, no weakness, no double vision, no gait abnormality, no slurred speech, no confusion  As per HPI otherwise 10 point review of systems negative.   Past Medical History:   Past Medical History:  Diagnosis Date  . At risk  for sleep apnea    12-25-2017   STOP-BANG SCORE= 5   --- SENT TO PCP  . Bladder cancer [facility]) dx 07/2017   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  s/p  cystectomy w/ ileal conduit urinary diversion  . Colostomy in place [facility])    since 08-08-2017-- per pt 12-25-2017 reddness around stoma  . GERD (gastroesophageal reflux disease)   . H/O hiatal hernia   . History of sepsis 09/2017   dx bacteremia due to klebsiella pneumoniae,  post op intraabdominal abscess  . Prostate  cancer [facility]) urologist-- dr [name]   10-02-2012 s/p  prostatectomy-- Stage T1c  . RBBB   . Renal disorder   . Wears glasses       Past Surgical History:  Procedure Laterality Date  . ABDOMINAL SURGERY    . APPENDECTOMY  1972  . CHOLECYSTECTOMY  1985  . COLOSTOMY REVERSAL N/A 01/08/2018   Procedure: COLOSTOMY REVERSAL;  Surgeon: Leighton Ruff, MD;  Location: [facility];  Service: General;  Laterality: N/A;  . CYSTOSCOPY WITH RETROGRADE PYELOGRAM, URETEROSCOPY AND STENT PLACEMENT Right 06/10/2017   Procedure: CYSTOSCOPY WITH RIGHT URETEROSCOPY WITH RIGHT STENT PLACEMENT;  Surgeon: Raynelle Bring, MD;  Location: [facility];  Service: Urology;  Laterality: Right;  . FLEXIBLE SIGMOIDOSCOPY N/A 12/13/2017   Procedure: FLEXIBLE SIGMOIDOSCOPY;  Surgeon: Leighton Ruff, MD;  Location: [facility];  Service: Endoscopy;  Laterality: N/A;  . IR CATHETER TUBE CHANGE  12/13/2017  . IR CATHETER TUBE CHANGE  01/17/2018  . IR CATHETER TUBE CHANGE  02/28/2018  . IR CONVERT LEFT NEPHROSTOMY TO NEPHROURETERAL CATH  10/24/2017  . IR NEPHRO TUBE REMOV/FL  10/24/2017  . IR NEPHROSTOMY PLACEMENT LEFT  10/07/2017  . ROBOT ASSISTED LAPAROSCOPIC RADICAL PROSTATECTOMY  10/02/2012   Procedure: ROBOTIC ASSISTED LAPAROSCOPIC RADICAL PROSTATECTOMY LEVEL 2;  Surgeon: Dutch Gray, MD;  Location: [facility];  Service: Urology;  Laterality: N/A;  . ROBOTIC ASSISTED LAPAROSCOPIC BLADDER DIVERTICULECTOMY N/A 08/08/2017   Procedure: XI ROBOTIC ASSISTED LAPAROSCOPIC RADICAL  CYSTECTOMY COVERTED TO OPEN PELVIC LYMPHADNECTOMY BILATERAL AND ILEAL CONDUIT URINARY DIVERSION;  Surgeon: Raynelle Bring, MD;  Location: [facility];  Service: Urology;  Laterality: N/A;  . TRANSURETHRAL RESECTION OF BLADDER TUMOR  06/10/2017   Procedure: TRANSURETHRAL RESECTION OF BLADDER TUMOR (TURBT);  Surgeon: Raynelle Bring, MD;  Location: [facility];  Service: Urology;;  . Mechele Claude ROBOT ABDOMINAL PERINEAL RESECTION N/A 08/08/2017   Procedure: REPAIR OF RECTAL TEAR POSSIBLE PARTIAL PROCTECTOMY, CREATION OF  OSTOMY;  Surgeon: Leighton Ruff, MD;  Location: [facility];  Service: General;  Laterality: N/A;    Social History:  Ambulatory independently      reports that he quit smoking about 41 years ago. His smoking use included cigarettes. He quit after 9.00 years of use. He has never used smokeless tobacco. He reports that he drinks alcohol. He reports that he does not use drugs.     Family History:   Family History  Problem Relation Age of Onset  . Lung cancer Mother   . Hypertension Father   . Colon cancer Other   . CAD Neg Hx   . Diabetes Neg Hx   . Stroke Neg Hx     Allergies: Allergies  Allergen Reactions  . Demerol [Meperidine] Other (See Comments)    SEVERE NAUSEA     Prior to Admission medications   Medication Sig Start Date End Date Taking? Authorizing Provider  acetaminophen (TYLENOL) 500 MG tablet Take 1,000 mg by mouth every 6 (six) hours as needed for mild pain.   Yes [provider]  Cholecalciferol (VITAMIN D PO) Take 1 tablet by mouth every other day.   Yes [provider]  Cyanocobalamin (VITAMIN B-12 PO) Take 1 tablet by mouth every other day.   Yes [provider]  docusate sodium (COLACE) 100 MG capsule Take 2 capsules (200 mg total) by mouth 2 (two) times daily as needed for mild constipation. 01/10/18 0/10/93 Yes Leighton Ruff, MD  esomeprazole (NEXIUM) 20 MG capsule Take 20 mg by mouth daily as needed (heartburn).   Yes [provider]  ibuprofen (ADVIL,MOTRIN) 200 MG tablet Take 600 mg by mouth every 6 (six) hours as needed for moderate pain.   Yes [provider]  Multiple Vitamin (MULTIVITAMIN WITH MINERALS) TABS tablet Take 1 tablet by mouth daily.   Yes [provider]  Omega-3 Fatty Acids (FISH OIL PO) Take 1 tablet by mouth daily.   Yes [provider]  senna (SENOKOT) 8.6 MG TABS tablet Take 1 tablet (8.6 mg total) by mouth daily as needed for mild constipation. 12/19/49  Yes Leighton Ruff, MD  HYDROcodone-acetaminophen (NORCO/VICODIN) 5-325 MG tablet Take 1-2 tablets by mouth every 4 (four) hours as needed for moderate pain or severe pain. Patient not taking: Reported on 03/01/2018 7/0/01   Leighton Ruff, MD  ibuprofen (ADVIL,MOTRIN) 400 MG tablet Take 0.5-1 tablets (200-400 mg total) by mouth every 6 (six) hours as needed for fever, headache or mild pain. Patient not taking: Reported on 03/01/2018 05/15/93   Leighton Ruff, MD   Physical Exam: Blood pressure 133/75, pulse 100, temperature (!) 103.9 F (39.9 C), temperature source Rectal, resp. rate (!) 23, height 6' (1.829 m), weight 97.5 kg (215 lb), SpO2 97 %. 1. General:  in No Acute distress  acutely ill -appearing 2. Psychological: Alert and  Oriented 3. Head/ENT:    Dry Mucous Membranes                          Head Non traumatic, neck supple                            Poor Dentition 4. SKIN:  decreased Skin turgor,  Skin clean Dry and intact no rash 5. Heart: Regular rate and rhythm no  Murmur, no Rub or gallop 6. Lungs: no wheezes or crackles   7. Abdomen: Soft,  non-tender, Non distended  obese  bowel sounds present 8. Lower extremities: no clubbing, cyanosis, or edema 9. Neurologically Grossly intact, moving all 4 extremities equally   10. MSK: Normal range of motion   LABS:     Recent Labs  Lab 03/01/18 1724  WBC 15.5*  NEUTROABS 12.4*  HGB 13.8  HCT 42.8  MCV 75.9*  PLT 496   Basic Metabolic Panel: Recent  Labs  Lab 03/01/18 1724  NA 137  K 3.7  CL 105  CO2 22  GLUCOSE 112*  BUN 18  CREATININE 1.04  CALCIUM 9.0      Recent Labs  Lab 03/01/18 1724  AST 15  ALT 16*  ALKPHOS 65  BILITOT 1.0  PROT 7.8  ALBUMIN 4.1   No results for input(s): LIPASE, AMYLASE in the last 168 hours. No results for input(s): AMMONIA in the last 168 hours.    HbA1C: No results for input(s): HGBA1C in the last 72 hours. CBG: No results for input(s): GLUCAP in the last 168 hours.    Urine analysis:    Component Value Date/Time   COLORURINE YELLOW 03/01/2018 1701   APPEARANCEUR CLOUDY (A) 03/01/2018 1701   LABSPEC 1.015 03/01/2018 1701   PHURINE 7.0 03/01/2018 1701   GLUCOSEU NEGATIVE 03/01/2018 1701   HGBUR LARGE (A) 03/01/2018 1701   BILIRUBINUR NEGATIVE 03/01/2018 1701   KETONESUR NEGATIVE 03/01/2018 1701   PROTEINUR 100 (A) 03/01/2018 1701   NITRITE NEGATIVE 03/01/2018 1701   LEUKOCYTESUR LARGE (A) 03/01/2018 1701     Cultures:    Component Value Date/Time   SDES ABDOMEN 10/08/2017  [place] Normal 10/08/2017 1816   CULT  10/08/2017 1816    MODERATE CANDIDA ALBICANS FEW KLEBSIELLA PNEUMONIAE    REPTSTATUS 10/12/2017 FINAL 10/08/2017 1816     Radiological Exams on Admission: Dg Chest 2 View  Result Date: 03/01/2018 CLINICAL DATA:  Fevers EXAM: CHEST - 2 VIEW COMPARISON:  02/07/2018 FINDINGS: The heart size and mediastinal contours are within normal limits. Both lungs are clear. The visualized skeletal structures are unremarkable. IMPRESSION: No active cardiopulmonary disease. Electronically Signed   By: Inez Catalina M.D.   On: 03/01/2018 17:24   Ct Abdomen Pelvis W Contrast  Result Date: 03/01/2018 CLINICAL DATA:  Pt has a urostomy and had it changed on Friday along with the stent, they had more difficulty than usual placing it, feels weak and has had fever at home EXAM: CT ABDOMEN AND PELVIS WITH CONTRAST TECHNIQUE: Multidetector CT imaging of the abdomen and pelvis  was performed using the standard protocol following bolus administration of intravenous contrast. CONTRAST:  One hundred ISOVUE-300 IOPAMIDOL (ISOVUE-300) INJECTION 61% COMPARISON:  CT of the abdomen and pelvis on 01/29/2018 FINDINGS: Lower chest: Small nodule is identified in the RIGHT middle lobe, measuring 4 millimeters and stable in appearance. A small nodule is 5 millimeters on image 21 of series 4 within the LEFT LOWER lobe and also stable. There is minimal subsegmental atelectasis or scarring bilaterally. Hepatobiliary: Liver is homogeneous without focal mass. Status post cholecystectomy. Pancreas: Stable appearance of low-attenuation lesion along the dorsum of the pancreas measuring 1 centimeter. Spleen: Normal in size without focal abnormality. Adrenals/Urinary Tract: Normal adrenal glands. Normal RIGHT kidney and ureter two RIGHT LOWER QUADRANT ileal conduit. There is a stent within the LEFT kidney, extending into RIGHT LOWER QUADRANT ileal conduit. Mild dilatation of the renal pelvis is stable. Status post cystectomy Stomach/Bowel: Small hiatal hernia. Stomach is otherwise normal in appearance. Small bowel loops are normal in appearance. There is a RIGHT LOWER QUADRANT parastomal hernia containing small bowel loops and mesenteric fat. Small ventral hernia contains nondilated small bowel loops. There is significant diverticular disease in the sigmoid colon, not associated with acute inflammation. Appendectomy. RIGHT LOWER QUADRANT ileal conduit. Vascular/Lymphatic: There is atherosclerotic calcification of the abdominal aorta not associated with aneurysm. There is normal vascular opacification of the celiac axis, superior mesenteric artery, and inferior mesenteric artery. Normal appearance of the portal venous system and inferior vena cava. No retroperitoneal or mesenteric adenopathy. Reproductive: Prostatectomy. Other: No free pelvic fluid. Anterior abdominal wall postoperative changes. No free  intraperitoneal air or abscess. Musculoskeletal: Degenerative changes in the spine. No suspicious lytic or blastic lesions are identified. IMPRESSION: 1. Unremarkable appearance of stent, ileal conduit, and LEFT kidney following ureterostomy exchange. 2. No evidence for abscess. 3. Stable appearance of small nodules in the lung bases. 4. Small hiatal hernia. 5. Stable appearance of small cystic lesion along the dorsum of the pancreas. Annual follow-up with CT or MRI has been recommended. 6.  Aortic atherosclerosis.  (ICD10-I70.0) 7. Prostatectomy. 8. Sigmoid diverticulosis without acute diverticulitis. 9. Stable appearance of small anterior abdominal wall hernias. Electronically Signed   By: Nolon Nations M.D.   On: 03/01/2018 19:36   Ir Catheter Tube Change  Result Date: 02/28/2018 CLINICAL DATA:  Bladder carcinoma status post cystectomy and ileal conduit creation. Urinary leak with nephroureteral catheter placement. Presents for scheduled exchange. EXAM: LEFT RETROGRADE NEPHROURETERAL  CATHETER EXCHANGE UNDER FLUOROSCOPY FLUOROSCOPY TIME:  5.5 MINUTES; 4298 uGym2 DAP TECHNIQUE: The procedure, risks (including but not limited to bleeding,  infection, organ damage ), benefits, and alternatives were explained to the patient. Questions regarding the procedure were encouraged and answered. The patient understands and consents to the procedure. The nephroureteral catheter and surrounding ostomy were prepped with Betadine, draped in usual sterile fashion. A small amount of contrast was injected through the nephroureteral catheter to opacify the renal collecting system. The catheter was cut and exchanged over a 5.176" stiff hydrophilic angiographic wire for a new 12-French pigtail catheter, formed centrally within the collecting system under fluoroscopy. FAIR AMOUNT of deposits on the catheter requiring use of coaxial 4 French glide catheter for ease of removal. Contrast injection confirms appropriate positioning.  The patient tolerated the procedure well. COMPLICATIONS: COMPLICATIONS None. IMPRESSION: 1. Technically successful exchange of 12 French left retrograde nephroureteral catheter under fluoroscopy. Shorten scheduled interval to 5 weeks to facilitate exchange. Electronically Signed   By: Lucrezia Europe M.D.   On: 02/28/2018 11:07    Chart has been reviewed    Assessment/Plan  67 y.o. male with medical history significant of bladder/ prostate cancer implicated by postoperative intra-abdominal abscess bacteremia secondary to Klebsiella pneumonia urine leakage from surgical incision recurrent sepsis.  Rectal tear requiring colostomy and then colostomy reversal Admitted for sepsis secondary to UTI in the setting of recent Urostomy change    Present on Admission: . Sepsis ([place]) - Admit per Sepsis protocol likely source being   UTI,    - rehydrate with 28ml/kg  - initiate broad spectrum antibiotics Vancomycin and cefepime And vancomycin given recent procedure  -  obtain blood cultures  - Obtain serial lactic acid  - Obtain procalcitonin level  - Admit and monitor vital signs closely    Sepsis - Repeat Assessment     Vitals     Blood pressure 130/73, pulse 80, temperature 99 F (37.2 C), temperature source Oral, resp. rate 18, height 6' (1.829 m), weight 97.5 kg (215 lb), SpO2 99 %.  Heart:     Regular rate and rhythm  Lungs:    CTA  Capillary Refill:   <2 sec  Peripheral Pulse:   Radial pulse palpable  Skin:     Flushed   . 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 08/08/2017 -has been managed by urology will need close follow-up after discharge  UTI with recent urostomy exchange.  IR has been notified will see patient in consult tomorrow   Other plan as per orders.  DVT prophylaxis:  SCD       Code Status:  FULL CODE  as per patient   I had personally discussed CODE STATUS with patient and family   Family Communication:   Family   at  Bedside  plan of care was discussed  with  Wife,  Disposition Plan:    To home once workup is complete and patient is stable                                                 Consults called: IR aware will see in cosult    Admission status:   inpatient       Level of care      SDU        Lynix Bonine 03/02/2018, 12:57 AM    [facility]  Pager [phone]   after 2 AM please page floor coverage PA If 7AM-7PM, please contact the day team  taking care of the patient  [email or web address]  Password [email or web address]

## 2018-03-01 NOTE — ED Notes (Signed)
I sent  first set of blood cultures to the main lab

## 2018-03-01 NOTE — ED Notes (Signed)
No respiratory or acute distress noted alert and oriented x 3 call light in reach no reaction to medication noted family at bedside. 

## 2018-03-01 NOTE — Progress Notes (Addendum)
Pharmacy Antibiotic Note  Kristopher Thompson is a 67 y.o. male admitted on 03/01/2018 with UTI.  Pharmacy has been consulted for cefepime and vancomycin dosing.  Plan: Cefepime 2 Gm IV q8h Vancomycin 2 Gm x1 then 1 Gm IV q12h for est AUC = 462 Goal AUC = 400-500 F/u scr/cultures/levels  Height: 6' (182.9 cm) Weight: 215 lb (97.5 kg) IBW/kg (Calculated) : 77.6  Temp (24hrs), Avg:101.4 F (38.6 C), Min:99.8 F (37.7 C), Max:103.9 F (39.9 C)  Recent Labs  Lab 03/01/18 1724 03/01/18 1731  WBC 15.5*  --   CREATININE 1.04  --   LATICACIDVEN  --  1.39    Estimated Creatinine Clearance: 85.7 mL/min (by C-G formula based on SCr of 1.04 mg/dL).    Allergies  Allergen Reactions  . Demerol [Meperidine] Other (See Comments)    SEVERE NAUSEA    Antimicrobials this admission: 4/20 cefepime >>   4/21 vancomycin >>   Dose adjustments this admission:   Microbiology results:  BCx:   UCx:    Sputum:    MRSA PCR:   Thank you for allowing pharmacy to be a part of this patient's care.  Dorrene German 03/01/2018 10:57 PM

## 2018-03-01 NOTE — ED Triage Notes (Signed)
Pt has a urostomy and had it changed on Friday along with the stent, they had more difficulty than usual placing it, feels weak and has had fever at home

## 2018-03-01 NOTE — ED Provider Notes (Signed)
[facility]-ICU/STEPDOWN Provider Note   CSN: [id number] Arrival date & time: 03/01/18  1637     History   Chief Complaint Chief Complaint  Patient presents with  . Urinary stent problems  . Fever    HPI Kristopher Thompson is a 67 y.o. male with a history of septicemia due to Klebsiella pneumonia, diverting  Colostomy, prostate CA s/p prostatectomy 2013, bladder Ca s/p cystectomy w/ ileal conduit urinary diversion 9/18, and obesity presents to the emergency department with a chief complaint of generalized weakness, fever, chills, dizziness and left-sided mid back pain.  The patient endorses a fever of 103.8 orally at home last night.  States that he treated his fever by drinking a couple of sodas, but no other treatment prior to arrival.  He also endorses generalized weakness and rigors since yesterday that has been gradually worsening.  He states that he went to the office last night to complete some work and began to feel so weak that he had to crawl from his desk over to the couch in his office.  He also endorses intermittent dizziness, onset this a.m.  He states that when he is standing and walking that he almost feels as if he is going to fall over.  No history of similar.  Patient reports that he had his urostomy change yesterday.  The history is provided by the patient. No language interpreter was used.    Past Medical History:  Diagnosis Date  . At risk for sleep apnea    12-25-2017   STOP-BANG SCORE= 5   --- SENT TO PCP  . Bladder cancer [facility]) dx 07/2017   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  s/p  cystectomy w/ ileal conduit urinary diversion  . Colostomy in place [facility])    since 08-08-2017-- per pt 12-25-2017 reddness around stoma  . GERD (gastroesophageal reflux disease)   . H/O hiatal hernia   . History of sepsis 09/2017   dx bacteremia due to klebsiella pneumoniae,  post op intraabdominal abscess  . Prostate cancer [facility]) urologist-- dr  [name]   10-02-2012 s/p  prostatectomy-- Stage T1c  . RBBB   . Renal disorder   . Wears glasses     Patient Active Problem List   Diagnosis Date Noted  . 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 Candida infection   . Postoperative intra-abdominal abscess   . Abscess   . Fever 10/09/2017  . Bacteremia due to Klebsiella pneumoniae 10/09/2017  . Urine leakage from surgical incision   . Sepsis ([place]) 10/04/2017  . GERD (gastroesophageal reflux disease) 08/11/2017  . Obesity (BMI 30-39.9) 08/11/2017  . 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 08/08/2017 08/08/2017    Past Surgical History:  Procedure Laterality Date  . ABDOMINAL SURGERY    . APPENDECTOMY  1972  . CHOLECYSTECTOMY  1985  . COLOSTOMY REVERSAL N/A 01/08/2018   Procedure: COLOSTOMY REVERSAL;  Surgeon: Leighton Ruff, MD;  Location: [facility] ORS;  Service: General;  Laterality: N/A;  . CYSTOSCOPY WITH RETROGRADE PYELOGRAM, URETEROSCOPY AND STENT PLACEMENT Right 06/10/2017   Procedure: CYSTOSCOPY WITH RIGHT URETEROSCOPY WITH RIGHT STENT PLACEMENT;  Surgeon: Raynelle Bring, MD;  Location: [facility];  Service: Urology;  Laterality: Right;  . FLEXIBLE SIGMOIDOSCOPY N/A 12/13/2017   Procedure: FLEXIBLE SIGMOIDOSCOPY;  Surgeon: Leighton Ruff, MD;  Location: [facility];  Service: Endoscopy;  Laterality: N/A;  . IR CATHETER TUBE CHANGE  12/13/2017  . IR CATHETER TUBE CHANGE  01/17/2018  . IR CATHETER TUBE CHANGE  02/28/2018  . IR CONVERT LEFT NEPHROSTOMY TO NEPHROURETERAL CATH  10/24/2017  . IR NEPHRO TUBE REMOV/FL  10/24/2017  . IR NEPHROSTOMY PLACEMENT LEFT  10/07/2017  . ROBOT ASSISTED LAPAROSCOPIC RADICAL PROSTATECTOMY  10/02/2012   Procedure: ROBOTIC ASSISTED LAPAROSCOPIC RADICAL PROSTATECTOMY LEVEL 2;  Surgeon: Dutch Gray, MD;  Location: [facility];  Service: Urology;  Laterality: N/A;  . ROBOTIC ASSISTED LAPAROSCOPIC BLADDER  DIVERTICULECTOMY N/A 08/08/2017   Procedure: XI ROBOTIC ASSISTED LAPAROSCOPIC RADICAL CYSTECTOMY COVERTED TO OPEN PELVIC LYMPHADNECTOMY BILATERAL AND ILEAL CONDUIT URINARY DIVERSION;  Surgeon: Raynelle Bring, MD;  Location: [facility];  Service: Urology;  Laterality: N/A;  . TRANSURETHRAL RESECTION OF BLADDER TUMOR  06/10/2017   Procedure: TRANSURETHRAL RESECTION OF BLADDER TUMOR (TURBT);  Surgeon: Raynelle Bring, MD;  Location: [facility];  Service: Urology;;  . XI ROBOT ABDOMINAL PERINEAL RESECTION N/A 08/08/2017   Procedure: REPAIR OF RECTAL TEAR POSSIBLE PARTIAL PROCTECTOMY, CREATION OF  OSTOMY;  Surgeon: Leighton Ruff, MD;  Location: [facility];  Service: General;  Laterality: N/A;        Home Medications    Prior to Admission medications   Medication Sig Start Date End Date Taking? Authorizing Provider  acetaminophen (TYLENOL) 500 MG tablet Take 1,000 mg by mouth every 6 (six) hours as needed for mild pain.   Yes [provider]  Cholecalciferol (VITAMIN D PO) Take 1 tablet by mouth every other day.   Yes [provider]  Cyanocobalamin (VITAMIN B-12 PO) Take 1 tablet by mouth every other day.   Yes [provider]  docusate sodium (COLACE) 100 MG capsule Take 2 capsules (200 mg total) by mouth 2 (two) times daily as needed for mild constipation. 01/10/18 1/74/08 Yes Leighton Ruff, MD  esomeprazole (NEXIUM) 20 MG capsule Take 20 mg by mouth daily as needed (heartburn).   Yes [provider]  ibuprofen (ADVIL,MOTRIN) 200 MG tablet Take 600 mg by mouth every 6 (six) hours as needed for moderate pain.   Yes [provider]  Multiple Vitamin (MULTIVITAMIN WITH MINERALS) TABS tablet Take 1 tablet by mouth daily.   Yes [provider]  Omega-3 Fatty Acids (FISH OIL PO) Take 1 tablet by mouth daily.   Yes [provider]  senna (SENOKOT) 8.6 MG TABS tablet Take 1 tablet (8.6 mg total) by mouth daily as needed for mild constipation. 11/15/46  Yes  Leighton Ruff, MD  HYDROcodone-acetaminophen (NORCO/VICODIN) 5-325 MG tablet Take 1-2 tablets by mouth every 4 (four) hours as needed for moderate pain or severe pain. Patient not taking: Reported on 03/01/2018 11/19/54   Leighton Ruff, MD  ibuprofen (ADVIL,MOTRIN) 400 MG tablet Take 0.5-1 tablets (200-400 mg total) by mouth every 6 (six) hours as needed for fever, headache or mild pain. Patient not taking: Reported on 03/01/2018 01/11/48   Leighton Ruff, MD    Family History Family History  Problem Relation Age of Onset  . Lung cancer Mother   . Hypertension Father   . Colon cancer Other   . CAD Neg Hx   . Diabetes Neg Hx   . Stroke Neg Hx     Social History Social History   Tobacco Use  . Smoking status: Former Smoker    Years: 9.00    Types: Cigarettes    Last attempt to quit: 12/24/1976    Years since quitting: 41.2  . Smokeless tobacco: Never Used  Substance Use Topics  .  Alcohol use: Yes    Comment: occasional  . Drug use: No     Allergies   Demerol [meperidine]   Review of Systems Review of Systems  Constitutional: Positive for chills and fever. Negative for appetite change.  HENT: Negative for congestion.   Respiratory: Negative for cough and shortness of breath.   Cardiovascular: Negative for chest pain.  Gastrointestinal: Negative for abdominal pain, constipation, diarrhea, nausea and vomiting.  Genitourinary: Positive for flank pain. Negative for dysuria, hematuria, penile swelling, scrotal swelling, testicular pain and urgency.  Musculoskeletal: Negative for back pain and neck pain.  Skin: Negative for rash.  Allergic/Immunologic: Negative for immunocompromised state.  Neurological: Positive for dizziness and weakness (generalized ). Negative for tremors, syncope, light-headedness, numbness and headaches.  Psychiatric/Behavioral: Negative for confusion.   Physical Exam Updated Vital Signs BP (!) 133/45   Pulse 80   Temp 99 F (37.2 C) (Oral)   Resp  (!) 23   Ht 6' (1.829 m)   Wt 97.5 kg (215 lb)   SpO2 99%   BMI 29.16 kg/m   Physical Exam  Constitutional: He appears well-developed.  HENT:  Head: Normocephalic.  Eyes: Conjunctivae are normal.  Neck: Neck supple.  Cardiovascular: Normal rate, regular rhythm, normal heart sounds and intact distal pulses. Exam reveals no gallop and no friction rub.  No murmur heard. Pulmonary/Chest: Effort normal and breath sounds normal. No stridor. No respiratory distress. He has no wheezes. He has no rales. He exhibits no tenderness.  Abdominal: Soft. He exhibits no distension and no mass. There is tenderness. There is no rebound and no guarding. No hernia.  Protuberant abdomen. Well-healed midline vertical scar on the abodmen. Urostomy in place with yellow urine. Mildly erythematous and warm around urostomy site. Abdomen is otherwise non-tender to palpation. No rebound or guarding. Bilateral CVA tenderness L>R.   Neurological: He is alert.  Skin: Skin is warm and dry. Capillary refill takes less than 2 seconds. No rash noted. There is erythema.  Psychiatric: His behavior is normal.  Nursing note and vitals reviewed.    ED Treatments / Results  Labs (all labs ordered are listed, but only abnormal results are displayed) Labs Reviewed  COMPREHENSIVE METABOLIC PANEL - Abnormal; Notable for the following components:      Result Value   Glucose, Bld 112 (*)    ALT 16 (*)    All other components within normal limits  CBC WITH DIFFERENTIAL/PLATELET - Abnormal; Notable for the following components:   WBC 15.5 (*)    MCV 75.9 (*)    MCH 24.5 (*)    Neutro Abs 12.4 (*)    Monocytes Absolute 1.8 (*)    All other components within normal limits  URINALYSIS, ROUTINE W REFLEX MICROSCOPIC - Abnormal; Notable for the following components:   APPearance CLOUDY (*)    Hgb urine dipstick LARGE (*)    Protein, ur 100 (*)    Leukocytes, UA LARGE (*)    Bacteria, UA MANY (*)    All other components within  normal limits  CULTURE, BLOOD (ROUTINE X 2)  CULTURE, BLOOD (ROUTINE X 2)  URINE CULTURE  MRSA PCR SCREENING  LACTIC ACID, PLASMA  PROCALCITONIN  MAGNESIUM  PHOSPHORUS  TSH  COMPREHENSIVE METABOLIC PANEL  CBC  LACTIC ACID, PLASMA  PROTIME-INR  APTT  I-STAT CG4 LACTIC ACID, ED    EKG EKG Interpretation  Date/Time:  Saturday March 01 2018 18:50:41 EDT Ventricular Rate:  99 PR Interval:    QRS Duration: 117  QT Interval:  390 QTC Calculation: 501 R Axis:   -178 Text Interpretation:  Sinus rhythm Incomplete right bundle branch block Probable lateral infarct, age indeterminate No significant change since last tracing Confirmed by Wandra Arthurs [phone]) on 03/01/2018 6:56:30 PM   Radiology Dg Chest 2 View  Result Date: 03/01/2018 CLINICAL DATA:  Fevers EXAM: CHEST - 2 VIEW COMPARISON:  02/07/2018 FINDINGS: The heart size and mediastinal contours are within normal limits. Both lungs are clear. The visualized skeletal structures are unremarkable. IMPRESSION: No active cardiopulmonary disease. Electronically Signed   By: Inez Catalina M.D.   On: 03/01/2018 17:24   Ct Abdomen Pelvis W Contrast  Result Date: 03/01/2018 CLINICAL DATA:  Pt has a urostomy and had it changed on Friday along with the stent, they had more difficulty than usual placing it, feels weak and has had fever at home EXAM: CT ABDOMEN AND PELVIS WITH CONTRAST TECHNIQUE: Multidetector CT imaging of the abdomen and pelvis was performed using the standard protocol following bolus administration of intravenous contrast. CONTRAST:  One hundred ISOVUE-300 IOPAMIDOL (ISOVUE-300) INJECTION 61% COMPARISON:  CT of the abdomen and pelvis on 01/29/2018 FINDINGS: Lower chest: Small nodule is identified in the RIGHT middle lobe, measuring 4 millimeters and stable in appearance. A small nodule is 5 millimeters on image 21 of series 4 within the LEFT LOWER lobe and also stable. There is minimal subsegmental atelectasis or scarring  bilaterally. Hepatobiliary: Liver is homogeneous without focal mass. Status post cholecystectomy. Pancreas: Stable appearance of low-attenuation lesion along the dorsum of the pancreas measuring 1 centimeter. Spleen: Normal in size without focal abnormality. Adrenals/Urinary Tract: Normal adrenal glands. Normal RIGHT kidney and ureter two RIGHT LOWER QUADRANT ileal conduit. There is a stent within the LEFT kidney, extending into RIGHT LOWER QUADRANT ileal conduit. Mild dilatation of the renal pelvis is stable. Status post cystectomy Stomach/Bowel: Small hiatal hernia. Stomach is otherwise normal in appearance. Small bowel loops are normal in appearance. There is a RIGHT LOWER QUADRANT parastomal hernia containing small bowel loops and mesenteric fat. Small ventral hernia contains nondilated small bowel loops. There is significant diverticular disease in the sigmoid colon, not associated with acute inflammation. Appendectomy. RIGHT LOWER QUADRANT ileal conduit. Vascular/Lymphatic: There is atherosclerotic calcification of the abdominal aorta not associated with aneurysm. There is normal vascular opacification of the celiac axis, superior mesenteric artery, and inferior mesenteric artery. Normal appearance of the portal venous system and inferior vena cava. No retroperitoneal or mesenteric adenopathy. Reproductive: Prostatectomy. Other: No free pelvic fluid. Anterior abdominal wall postoperative changes. No free intraperitoneal air or abscess. Musculoskeletal: Degenerative changes in the spine. No suspicious lytic or blastic lesions are identified. IMPRESSION: 1. Unremarkable appearance of stent, ileal conduit, and LEFT kidney following ureterostomy exchange. 2. No evidence for abscess. 3. Stable appearance of small nodules in the lung bases. 4. Small hiatal hernia. 5. Stable appearance of small cystic lesion along the dorsum of the pancreas. Annual follow-up with CT or MRI has been recommended. 6.  Aortic  atherosclerosis.  (ICD10-I70.0) 7. Prostatectomy. 8. Sigmoid diverticulosis without acute diverticulitis. 9. Stable appearance of small anterior abdominal wall hernias. Electronically Signed   By: Nolon Nations M.D.   On: 03/01/2018 19:36   Ir Catheter Tube Change  Result Date: 02/28/2018 CLINICAL DATA:  Bladder carcinoma status post cystectomy and ileal conduit creation. Urinary leak with nephroureteral catheter placement. Presents for scheduled exchange. EXAM: LEFT RETROGRADE NEPHROURETERAL  CATHETER EXCHANGE UNDER FLUOROSCOPY FLUOROSCOPY TIME:  5.5 MINUTES; 4298 uGym2 DAP TECHNIQUE: The  procedure, risks (including but not limited to bleeding, infection, organ damage ), benefits, and alternatives were explained to the patient. Questions regarding the procedure were encouraged and answered. The patient understands and consents to the procedure. The nephroureteral catheter and surrounding ostomy were prepped with Betadine, draped in usual sterile fashion. A small amount of contrast was injected through the nephroureteral catheter to opacify the renal collecting system. The catheter was cut and exchanged over a 9.242" stiff hydrophilic angiographic wire for a new 12-French pigtail catheter, formed centrally within the collecting system under fluoroscopy. FAIR AMOUNT of deposits on the catheter requiring use of coaxial 4 French glide catheter for ease of removal. Contrast injection confirms appropriate positioning. The patient tolerated the procedure well. COMPLICATIONS: COMPLICATIONS None. IMPRESSION: 1. Technically successful exchange of 12 French left retrograde nephroureteral catheter under fluoroscopy. Shorten scheduled interval to 5 weeks to facilitate exchange. Electronically Signed   By: Lucrezia Europe M.D.   On: 02/28/2018 11:07    Procedures .Critical Care Performed by: Joanne Gavel, PA-C Authorized by: Joanne Gavel, PA-C   Critical care provider statement:    Critical care time (minutes):   40   Critical care time was exclusive of:  Separately billable procedures and treating other patients and teaching time   Critical care was necessary to treat or prevent imminent or life-threatening deterioration of the following conditions:  Sepsis   Critical care was time spent personally by me on the following activities:  Blood draw for specimens, discussions with consultants, examination of patient, evaluation of patient's response to treatment, ordering and review of laboratory studies, ordering and review of radiographic studies, re-evaluation of patient's condition, review of old charts and obtaining history from patient or surrogate    Medications Ordered in ED Medications  pantoprazole (PROTONIX) EC tablet 40 mg (has no administration in time range)  acetaminophen (TYLENOL) tablet 650 mg (has no administration in time range)    Or  acetaminophen (TYLENOL) suppository 650 mg (has no administration in time range)  HYDROcodone-acetaminophen (NORCO/VICODIN) 5-325 MG per tablet 1-2 tablet (has no administration in time range)  ondansetron (ZOFRAN) tablet 4 mg (has no administration in time range)    Or  ondansetron (ZOFRAN) injection 4 mg (has no administration in time range)  0.9 %  sodium chloride infusion ( Intravenous New Bag/Given 03/02/18 0051)  polyethylene glycol (MIRALAX / GLYCOLAX) packet 17 g (has no administration in time range)  ceFEPIme (MAXIPIME) 2 g in sodium chloride 0.9 % 100 mL IVPB (has no administration in time range)  vancomycin (VANCOCIN) 2,000 mg in sodium chloride 0.9 % 500 mL IVPB (2,000 mg Intravenous New Bag/Given 03/02/18 0201)  vancomycin (VANCOCIN) IVPB 1000 mg/200 mL premix (has no administration in time range)  acetaminophen (TYLENOL) tablet 1,000 mg (1,000 mg Oral Given 03/01/18 1900)  sodium chloride 0.9 % bolus 1,000 mL (0 mLs Intravenous Stopped 03/01/18 2004)  ceFEPIme (MAXIPIME) 2 g in sodium chloride 0.9 % 100 mL IVPB (0 g Intravenous Stopped 03/01/18  2004)  iopamidol (ISOVUE-300) 61 % injection 100 mL ( Intravenous Canceled Entry 03/01/18 1916)     Initial Impression / Assessment and Plan / ED Course  I have reviewed the triage vital signs and the nursing notes.  Pertinent labs & imaging results that were available during my care of the patient were reviewed by me and considered in my medical decision making (see chart for details).     67 year old male with a history of septicemia due to Klebsiella pneumonia, diverting  Colostomy, prostate CA s/p prostatectomy 2013, bladder Ca s/p cystectomy w/ ileal conduit urinary diversion 9/18, and obesity presenting with fever, chills, rigors, bilateral CVA tenderness and dizziness since yesterday. He had a urostomy tube exchanged yesterday by IR. The patient was seen and evaluated with Dr. Darl Householder, attending physician.  Rectal temp. 103.9. Tachycardic in the 100s on arrival. Leukocytosis of 15.5 with elevated absolute neutrophil count of 12.4. Lactate was normal. Code sepsis initiated.   CMP is unremarkable.  UA with large hemoglobinuria, 1+ proteinuria, WBC clumps and bacteruria.  Urine culture sent.  Physical exam, the patient has bilateral CVA tenderness, L>R.  There is mild erythema and warmth around the urostomy site, but the abdomen is otherwise unremarkable. Urostomy is draining yellow urine. CXR is negative.   CT abdomen pelvis ordered since the patient had history of intra-abdominal infection.  CT is negative. Urinary source suspected.  Cefepime initiated in the ED and dose by pharmacy.  The patient is critically ill with one or more organ systems acutely impaired such that there is a high probability of imminent or life threatening deterioration in the patient's conditions. As such, 40 minutes of critical care time was performed.   Spoke with Dr. Laurence Ferrari, interventional radiology, regarding patient's urostomy tube exchange yesterday.  He feels there is minimal chance the patient's source of  sepsis is from the urostomy tube exchange if the urostomy was draining urine and another source should be investigated for the patient's symptoms. Discussed that the patient had a cystectomy performed on 08/08/17, and he was diagnosed with bacteremia due to Klebsiella on 11/18. Dr. Laurence Ferrari will have one of the IR PAs round on the patient starting tomorrow.  Spoke with Dr. Roel Cluck, hospitalist who will accept the patient for admission. The patient appears reasonably stabilized for admission considering the current resources, flow, and capabilities available in the ED at this time, and I doubt any other [facility] requiring further screening and/or treatment in the ED prior to admission.   Final Clinical Impressions(s) / ED Diagnoses   Final diagnoses:  Sepsis, due to unspecified organism [facility])    ED Discharge Orders    None       Joanne Gavel, PA-C 03/02/18 0235    Drenda Freeze, MD 03/02/18 2342

## 2018-03-02 ENCOUNTER — Other Ambulatory Visit: Payer: Self-pay

## 2018-03-02 LAB — PROTIME-INR
INR: 1.2
PROTHROMBIN TIME: 15.1 s (ref 11.4–15.2)

## 2018-03-02 LAB — COMPREHENSIVE METABOLIC PANEL
ALK PHOS: 49 U/L (ref 38–126)
ALT: 14 U/L — AB (ref 17–63)
AST: 10 U/L — AB (ref 15–41)
Albumin: 3.3 g/dL — ABNORMAL LOW (ref 3.5–5.0)
Anion gap: 5 (ref 5–15)
BUN: 16 mg/dL (ref 6–20)
CALCIUM: 8.2 mg/dL — AB (ref 8.9–10.3)
CO2: 23 mmol/L (ref 22–32)
CREATININE: 0.94 mg/dL (ref 0.61–1.24)
Chloride: 111 mmol/L (ref 101–111)
GFR calc non Af Amer: >60 mL/min (ref >60)
Glucose, Bld: 103 mg/dL — ABNORMAL HIGH (ref 65–99)
Potassium: 3.9 mmol/L (ref 3.5–5.1)
SODIUM: 139 mmol/L (ref 135–145)
Total Bilirubin: 0.8 mg/dL (ref 0.3–1.2)
Total Protein: 6.3 g/dL — ABNORMAL LOW (ref 6.5–8.1)

## 2018-03-02 LAB — CBC
HCT: 37.6 % — ABNORMAL LOW (ref 39.0–52.0)
Hemoglobin: 11.9 g/dL — ABNORMAL LOW (ref 13.0–17.0)
MCH: 24.4 pg — AB (ref 26.0–34.0)
MCHC: 31.6 g/dL (ref 30.0–36.0)
MCV: 77 fL — AB (ref 78.0–100.0)
PLATELETS: 160 10*3/uL (ref 150–400)
RBC: 4.88 MIL/uL (ref 4.22–5.81)
RDW: 15.6 % — AB (ref 11.5–15.5)
WBC: 13.3 10*3/uL — ABNORMAL HIGH (ref 4.0–10.5)

## 2018-03-02 LAB — PROCALCITONIN: Procalcitonin: <0.1 ng/mL

## 2018-03-02 LAB — APTT: aPTT: 33 seconds (ref 24–36)

## 2018-03-02 LAB — PHOSPHORUS: Phosphorus: 2.3 mg/dL — ABNORMAL LOW (ref 2.5–4.6)

## 2018-03-02 LAB — MRSA PCR SCREENING: MRSA BY PCR: NEGATIVE

## 2018-03-02 LAB — LACTIC ACID, PLASMA
LACTIC ACID, VENOUS: 1.4 mmol/L (ref 0.5–1.9)
Lactic Acid, Venous: 0.9 mmol/L (ref 0.5–1.9)

## 2018-03-02 LAB — TSH: TSH: 0.103 u[IU]/mL — ABNORMAL LOW (ref 0.350–4.500)

## 2018-03-02 LAB — MAGNESIUM: Magnesium: 1.9 mg/dL (ref 1.7–2.4)

## 2018-03-02 MED ORDER — SODIUM CHLORIDE 0.9 % IV SOLN
2000.0000 mg | Freq: Once | INTRAVENOUS | Status: DC
  Filled 2018-03-02: qty 2000

## 2018-03-02 MED ORDER — VANCOMYCIN HCL IN DEXTROSE 1-5 GM/200ML-% IV SOLN
1000.0000 mg | Freq: Two times a day (BID) | INTRAVENOUS | Status: DC
  Administered 2018-03-02 – 2018-03-03 (×2): 1000 mg via INTRAVENOUS
  Filled 2018-03-02 (×2): qty 200

## 2018-03-02 MED ORDER — K PHOS MONO-SOD PHOS DI & MONO 155-852-130 MG PO TABS
500.0000 mg | ORAL_TABLET | Freq: Three times a day (TID) | ORAL | Status: AC
  Administered 2018-03-02 – 2018-03-03 (×3): 500 mg via ORAL
  Filled 2018-03-02 (×3): qty 2

## 2018-03-02 MED ORDER — VANCOMYCIN HCL 10 G IV SOLR
2000.0000 mg | Freq: Once | INTRAVENOUS | Status: AC
  Administered 2018-03-02: 2000 mg via INTRAVENOUS
  Filled 2018-03-02: qty 2000

## 2018-03-02 MED ORDER — SODIUM CHLORIDE 0.9 % IV SOLN
INTRAVENOUS | Status: DC
  Administered 2018-03-02 – 2018-03-03 (×2): via INTRAVENOUS

## 2018-03-02 NOTE — Progress Notes (Signed)
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 Urinary leak with (L) nephroureteral catheter placement. Came in for scheduled exchange yesterday, successful exchange. Developed some high fever and rigors at home and came to the ER. Has been admitted and started on IV abx. IR called to eval tube. Pt resting in bed, fever has improved and pt feeling much better than last night. Good urine output from the ostomy/tube  Objective: Physical Exam: BP 133/67   Pulse 80   Temp (!) 100.7 F (38.2 C) (Oral)   Resp 13   Ht 6' (1.829 m)   Wt 248 lb 7.3 oz (112.7 kg)   SpO2 99%   BMI 33.70 kg/m  Resting in bed, nontoxic appearing. Abd: soft, NT RLQ ostomy with visible nephroureteral tube in place, clear UOP in bag.   Current Facility-Administered Medications:  .  acetaminophen (TYLENOL) tablet 650 mg, 650 mg, Oral, Q6H PRN, 650 mg at 03/02/18 0957 **OR** acetaminophen (TYLENOL) suppository 650 mg, 650 mg, Rectal, Q6H PRN, Doutova, Anastassia, MD .  ceFEPIme (MAXIPIME) 2 g in sodium chloride 0.9 % 100 mL IVPB, 2 g, Intravenous, Q8H, Dorrene German, RPH, Stopped at 03/02/18 0447 .  HYDROcodone-acetaminophen (NORCO/VICODIN) 5-325 MG per tablet 1-2 tablet, 1-2 tablet, Oral, Q4H PRN, Doutova, Anastassia, MD .  ondansetron (ZOFRAN) tablet 4 mg, 4 mg, Oral, Q6H PRN **OR** ondansetron (ZOFRAN) injection 4 mg, 4 mg, Intravenous, Q6H PRN, Doutova, Anastassia, MD .  pantoprazole (PROTONIX) EC tablet 40 mg, 40 mg, Oral, Daily, Doutova, Anastassia, MD, 40 mg at 03/02/18 0957 .  polyethylene glycol (MIRALAX / GLYCOLAX) packet 17 g, 17 g, Oral, Daily PRN, Doutova, Anastassia, MD .  vancomycin (VANCOCIN) IVPB 1000 mg/200 mL premix, 1,000 mg, Intravenous, Q12H, Dorrene German, [facility]  Labs: CBC Recent Labs    03/01/18 1724 03/02/18 0331  WBC  15.5* 13.3*  HGB 13.8 11.9*  HCT 42.8 37.6*  PLT 188 160   BMET Recent Labs    03/01/18 1724 03/02/18 0331  NA 137 139  K 3.7 3.9  CL 105 111  CO2 22 23  GLUCOSE 112* 103*  BUN 18 16  CREATININE 1.04 0.94  CALCIUM 9.0 8.2*   LFT Recent Labs    03/02/18 0331  PROT 6.3*  ALBUMIN 3.3*  AST 10*  ALT 14*  ALKPHOS 49  BILITOT 0.8   PT/INR Recent Labs    03/02/18 0331  LABPROT 15.1  INR 1.20     Studies/Results: Dg Chest 2 View  Result Date: 03/01/2018 CLINICAL DATA:  Fevers EXAM: CHEST - 2 VIEW COMPARISON:  02/07/2018 FINDINGS: The heart size and mediastinal contours are within normal limits. Both lungs are clear. The visualized skeletal structures are unremarkable. IMPRESSION: No active cardiopulmonary disease. Electronically Signed   By: Inez Catalina M.D.   On: 03/01/2018 17:24   Ct Abdomen Pelvis W Contrast  Result Date: 03/01/2018 CLINICAL DATA:  Pt has a urostomy and had it changed on Friday along with the stent, they had more difficulty than usual placing it, feels weak and has had fever at home EXAM: CT ABDOMEN AND PELVIS WITH CONTRAST TECHNIQUE: Multidetector CT imaging of the abdomen and pelvis was performed using the standard protocol following bolus administration of intravenous contrast. CONTRAST:  One hundred ISOVUE-300 IOPAMIDOL (ISOVUE-300) INJECTION 61% COMPARISON:  CT of the abdomen and pelvis on 01/29/2018 FINDINGS: Lower chest:  Small nodule is identified in the RIGHT middle lobe, measuring 4 millimeters and stable in appearance. A small nodule is 5 millimeters on image 21 of series 4 within the LEFT LOWER lobe and also stable. There is minimal subsegmental atelectasis or scarring bilaterally. Hepatobiliary: Liver is homogeneous without focal mass. Status post cholecystectomy. Pancreas: Stable appearance of low-attenuation lesion along the dorsum of the pancreas measuring 1 centimeter. Spleen: Normal in size without focal abnormality. Adrenals/Urinary Tract:  Normal adrenal glands. Normal RIGHT kidney and ureter two RIGHT LOWER QUADRANT ileal conduit. There is a stent within the LEFT kidney, extending into RIGHT LOWER QUADRANT ileal conduit. Mild dilatation of the renal pelvis is stable. Status post cystectomy Stomach/Bowel: Small hiatal hernia. Stomach is otherwise normal in appearance. Small bowel loops are normal in appearance. There is a RIGHT LOWER QUADRANT parastomal hernia containing small bowel loops and mesenteric fat. Small ventral hernia contains nondilated small bowel loops. There is significant diverticular disease in the sigmoid colon, not associated with acute inflammation. Appendectomy. RIGHT LOWER QUADRANT ileal conduit. Vascular/Lymphatic: There is atherosclerotic calcification of the abdominal aorta not associated with aneurysm. There is normal vascular opacification of the celiac axis, superior mesenteric artery, and inferior mesenteric artery. Normal appearance of the portal venous system and inferior vena cava. No retroperitoneal or mesenteric adenopathy. Reproductive: Prostatectomy. Other: No free pelvic fluid. Anterior abdominal wall postoperative changes. No free intraperitoneal air or abscess. Musculoskeletal: Degenerative changes in the spine. No suspicious lytic or blastic lesions are identified. IMPRESSION: 1. Unremarkable appearance of stent, ileal conduit, and LEFT kidney following ureterostomy exchange. 2. No evidence for abscess. 3. Stable appearance of small nodules in the lung bases. 4. Small hiatal hernia. 5. Stable appearance of small cystic lesion along the dorsum of the pancreas. Annual follow-up with CT or MRI has been recommended. 6.  Aortic atherosclerosis.  (ICD10-I70.0) 7. Prostatectomy. 8. Sigmoid diverticulosis without acute diverticulitis. 9. Stable appearance of small anterior abdominal wall hernias. Electronically Signed   By: Nolon Nations M.D.   On: 03/01/2018 19:36    Assessment/Plan: S/p exchange of  (L)nephroureteral tube/stent yesterday. Unfortunately developed some transient sepsis from the manipulation. Appears to be resolving with IV abx. Ok to eat, no further procedure recommended. Will likely consider prophylactic IV abx prior to future exchanges given this event. Call IR if further questions/concerns    LOS: 1 day   I spent a total of 15 minutes in face to face in clinical consultation, greater than 50% of which was counseling/coordinating care for (L)nephroureteral stent  Ascencion Dike PA-C 03/02/2018 12:45 PM

## 2018-03-02 NOTE — Plan of Care (Signed)
  Problem: Pain Managment: Goal: General experience of comfort will improve Outcome: Progressing   

## 2018-03-02 NOTE — Progress Notes (Addendum)
PROGRESS NOTE    ZAYN SELLEY  EXB:[id number] DOB: 1951-03-11 DOA: 03/01/2018 PCP: Lorene Dy, MD   Brief Narrative: Patient is a 67 year old male with past medical history significant for bladder/prostate cancer complicated by postoperative intra-abdominal abscess, bacteremia secondary to Klebsiella pneumonia ,urine leak is from surgical incision, recurrent sepsis, rectal tear requiring colostomy  and reversal.  Patient had his urostomy tube changed by IR 2 days ago .  After he went home he became fatigued and febrile.  He was noted to have a fever of 103 at home.  Patient brought to the emergency department for further evaluation. Patient is currently being treated for possible sepsis.  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  Currently hemodynamically stable.  He started on broad-spectrum antibiotics with vancomycin and cefepime.We will continue current antibiotics  pending blood /urine culture report. Presented with mild leukocytosis which is improving.  Lactic acid level is normal.  Procalcitonin nonreassuring.  Urostomy tube change:S/p exchange of (L)nephroureteral tube/stent 2 days ago.IR saw him after that admission, no intervention planned. RN notified about crusting of skin around the urostomy bag site.We will consult ostomy nurse.  History of bladder/ prostate cancer: s/p cystoprostatectomy/ileal conduit rope with abdominal perineal resection .Had rectal tear s/p  colostomy after rectal injury 08/08/17;  s/p urinoma drain placement on 10/04/17 by IR  s/p left PCN 10/07/17 and nephrostogram with ureteral stent placement on 10/24/2017 by IR. He follows with urology as an outpatient.  Low TSH: Lab error? We will check free T3 and T4.  DVT prophylaxis: SCD Code Status:  Full Family Communication: Wife present at the bedside Disposition Plan: Home Pending blood/urine culture   Consultants: IR  Procedures: None on this admission  Antimicrobials: Vancomycin and cefepime since 03/01/2018  Subjective: Patient seen and examined the bedside this morning.  Remains comfortable.  Afebrile till this morning.  Blood pressure stable  Objective: Vitals:   03/02/18 0600 03/02/18 0800 03/02/18 1000 03/02/18 1200  BP: 123/64 130/65 133/67 123/65  Pulse:      Resp: (!) 22 (!) 27 13 17   Temp:  (!) 100.7 F (38.2 C)  98.8 F (37.1 C)  TempSrc:  Oral  Oral  SpO2:      Weight:      Height:        Intake/Output Summary (Last 24 hours) at 03/02/2018 1311 Last data filed at 03/02/2018 0500 Gross per 24 hour  Intake 618.75 ml  Output 1600 ml  Net -981.25 ml   Filed Weights   03/01/18 1659 03/02/18 0415  Weight: 97.5 kg (215 lb) 112.7 kg (248 lb 7.3 oz)    Examination:  General exam: Appears calm and comfortable ,Not in distress,average built HEENT:PERRL,Oral mucosa moist, Ear/Nose normal on gross exam Respiratory system: Bilateral equal air entry, normal vesicular breath sounds, no wheezes or crackles  Cardiovascular system: S1 & S2 heard, RRR. No JVD, murmurs, rubs, gallops or clicks. No pedal edema. Gastrointestinal system: Abdomen is nondistended, soft and nontender. No organomegaly or masses felt. Normal bowel sounds heard.  Scars from previous surgery.  Right-sided urostomy. Central nervous system: Alert and oriented. No focal neurological deficits. Extremities: No edema, no clubbing ,no cyanosis, distal peripheral pulses palpable. Skin: No rashes, lesions or ulcers,no icterus ,no pallor MSK: Normal muscle bulk,tone ,power Psychiatry: Judgement  and insight appear normal. Mood & affect appropriate.     Data Reviewed: I have personally reviewed following labs and imaging studies  CBC: Recent Labs  Lab 03/01/18 1724 03/02/18 0331  WBC 15.5* 13.3*   NEUTROABS 12.4*  --   HGB 13.8 11.9*  HCT 42.8 37.6*  MCV 75.9* 77.0*  PLT 188 413   Basic Metabolic Panel: Recent Labs  Lab 03/01/18 1724 03/02/18 0331  NA 137 139  K 3.7 3.9  CL 105 111  CO2 22 23  GLUCOSE 112* 103*  BUN 18 16  CREATININE 1.04 0.94  CALCIUM 9.0 8.2*  MG  --  1.9  PHOS  --  2.3*   GFR: Estimated Creatinine Clearance: 101.5 mL/min (by C-G formula based on SCr of 0.94 mg/dL). Liver Function Tests: Recent Labs  Lab 03/01/18 1724 03/02/18 0331  AST 15 10*  ALT 16* 14*  ALKPHOS 65 49  BILITOT 1.0 0.8  PROT 7.8 6.3*  ALBUMIN 4.1 3.3*   No results for input(s): LIPASE, AMYLASE in the last 168 hours. No results for input(s): AMMONIA in the last 168 hours. Coagulation Profile: Recent Labs  Lab 03/02/18 0331  INR 1.20   Cardiac Enzymes: No results for input(s): CKTOTAL, CKMB, CKMBINDEX, TROPONINI in the last 168 hours. BNP (last 3 results) No results for input(s): PROBNP in the last 8760 hours. HbA1C: No results for input(s): HGBA1C in the last 72 hours. CBG: No results for input(s): GLUCAP in the last 168 hours. Lipid Profile: No results for input(s): CHOL, HDL, LDLCALC, TRIG, CHOLHDL, LDLDIRECT in the last 72 hours. Thyroid Function Tests: Recent Labs    03/02/18 0331  TSH 0.103*   Anemia Panel: No results for input(s): VITAMINB12, FOLATE, FERRITIN, TIBC, IRON, RETICCTPCT in the last 72 hours. Sepsis Labs: Recent Labs  Lab 03/01/18 1731 03/02/18 0036 03/02/18 0331  PROCALCITON  --  <0.10  --   LATICACIDVEN 1.39 1.4 0.9    Recent Results (from the past 240 hour(s))  Blood Culture (routine x 2)     Status: None (Preliminary result)   Collection Time: 03/01/18  6:40 PM  Result Value Ref Range Status   Specimen Description BLOOD RIGHT ANTECUBITAL  Final   Special Requests   Final    BOTTLES DRAWN AEROBIC AND ANAEROBIC Blood Culture results may not be optimal due to an excessive volume of blood received in culture bottles   Culture  PENDING  Incomplete   Report Status PENDING  Incomplete  Blood Culture (routine x 2)     Status: None (Preliminary result)   Collection Time: 03/01/18  6:45 PM  Result Value Ref Range Status   Specimen Description BLOOD RIGHT WRIST  Final   Special Requests   Final    BOTTLES DRAWN AEROBIC AND ANAEROBIC Blood Culture adequate volume   Culture PENDING  Incomplete   Report Status PENDING  Incomplete  MRSA PCR Screening     Status: None   Collection Time: 03/02/18 12:52 AM  Result Value Ref Range Status   MRSA by PCR NEGATIVE NEGATIVE Final    Comment:        The GeneXpert MRSA Assay (FDA approved for NASAL specimens only), is one component of a comprehensive MRSA colonization surveillance program. It is not intended to diagnose MRSA infection nor to guide or monitor treatment for MRSA infections. Performed at [facility], [place] [address] [place], [place] [address]          Radiology Studies: Dg Chest 2  View  Result Date: 03/01/2018 CLINICAL DATA:  Fevers EXAM: CHEST - 2 VIEW COMPARISON:  02/07/2018 FINDINGS: The heart size and mediastinal contours are within normal limits. Both lungs are clear. The visualized skeletal structures are unremarkable. IMPRESSION: No active cardiopulmonary disease. Electronically Signed   By: Inez Catalina M.D.   On: 03/01/2018 17:24   Ct Abdomen Pelvis W Contrast  Result Date: 03/01/2018 CLINICAL DATA:  Pt has a urostomy and had it changed on Friday along with the stent, they had more difficulty than usual placing it, feels weak and has had fever at home EXAM: CT ABDOMEN AND PELVIS WITH CONTRAST TECHNIQUE: Multidetector CT imaging of the abdomen and pelvis was performed using the standard protocol following bolus administration of intravenous contrast. CONTRAST:  One hundred ISOVUE-300 IOPAMIDOL (ISOVUE-300) INJECTION 61% COMPARISON:  CT of the abdomen and pelvis on 01/29/2018 FINDINGS: Lower chest: Small nodule is identified in the  RIGHT middle lobe, measuring 4 millimeters and stable in appearance. A small nodule is 5 millimeters on image 21 of series 4 within the LEFT LOWER lobe and also stable. There is minimal subsegmental atelectasis or scarring bilaterally. Hepatobiliary: Liver is homogeneous without focal mass. Status post cholecystectomy. Pancreas: Stable appearance of low-attenuation lesion along the dorsum of the pancreas measuring 1 centimeter. Spleen: Normal in size without focal abnormality. Adrenals/Urinary Tract: Normal adrenal glands. Normal RIGHT kidney and ureter two RIGHT LOWER QUADRANT ileal conduit. There is a stent within the LEFT kidney, extending into RIGHT LOWER QUADRANT ileal conduit. Mild dilatation of the renal pelvis is stable. Status post cystectomy Stomach/Bowel: Small hiatal hernia. Stomach is otherwise normal in appearance. Small bowel loops are normal in appearance. There is a RIGHT LOWER QUADRANT parastomal hernia containing small bowel loops and mesenteric fat. Small ventral hernia contains nondilated small bowel loops. There is significant diverticular disease in the sigmoid colon, not associated with acute inflammation. Appendectomy. RIGHT LOWER QUADRANT ileal conduit. Vascular/Lymphatic: There is atherosclerotic calcification of the abdominal aorta not associated with aneurysm. There is normal vascular opacification of the celiac axis, superior mesenteric artery, and inferior mesenteric artery. Normal appearance of the portal venous system and inferior vena cava. No retroperitoneal or mesenteric adenopathy. Reproductive: Prostatectomy. Other: No free pelvic fluid. Anterior abdominal wall postoperative changes. No free intraperitoneal air or abscess. Musculoskeletal: Degenerative changes in the spine. No suspicious lytic or blastic lesions are identified. IMPRESSION: 1. Unremarkable appearance of stent, ileal conduit, and LEFT kidney following ureterostomy exchange. 2. No evidence for abscess. 3. Stable  appearance of small nodules in the lung bases. 4. Small hiatal hernia. 5. Stable appearance of small cystic lesion along the dorsum of the pancreas. Annual follow-up with CT or MRI has been recommended. 6.  Aortic atherosclerosis.  (ICD10-I70.0) 7. Prostatectomy. 8. Sigmoid diverticulosis without acute diverticulitis. 9. Stable appearance of small anterior abdominal wall hernias. Electronically Signed   By: Nolon Nations M.D.   On: 03/01/2018 19:36        Scheduled Meds: . pantoprazole  40 mg Oral Daily   Continuous Infusions: . ceFEPime (MAXIPIME) IV Stopped (03/02/18 0447)  . vancomycin       LOS: 1 day    Time spent: 25 mins.More than 50% of that time was spent in counseling and/or coordination of care.      Shelly Coss, MD [facility] Pager [phone]  If 7PM-7AM, please contact night-coverage [email or web address] Password [email or web address] 03/02/2018, 1:11 PM

## 2018-03-03 DIAGNOSIS — Z933 Colostomy status: Secondary | ICD-10-CM

## 2018-03-03 DIAGNOSIS — C670 Malignant neoplasm of trigone of bladder: Secondary | ICD-10-CM

## 2018-03-03 LAB — CBC WITH DIFFERENTIAL/PLATELET
BASOS ABS: 0 10*3/uL (ref 0.0–0.1)
Basophils Relative: 0 %
EOS PCT: 2 %
Eosinophils Absolute: 0.1 10*3/uL (ref 0.0–0.7)
HCT: 35.7 % — ABNORMAL LOW (ref 39.0–52.0)
Hemoglobin: 11.5 g/dL — ABNORMAL LOW (ref 13.0–17.0)
Lymphocytes Relative: 11 %
Lymphs Abs: 0.7 10*3/uL (ref 0.7–4.0)
MCH: 24.5 pg — ABNORMAL LOW (ref 26.0–34.0)
MCHC: 32.2 g/dL (ref 30.0–36.0)
MCV: 76 fL — ABNORMAL LOW (ref 78.0–100.0)
MONO ABS: 1.1 10*3/uL — AB (ref 0.1–1.0)
Monocytes Relative: 16 %
NEUTROS ABS: 4.9 10*3/uL (ref 1.7–7.7)
Neutrophils Relative %: 71 %
PLATELETS: 141 10*3/uL — AB (ref 150–400)
RBC: 4.7 MIL/uL (ref 4.22–5.81)
RDW: 15.4 % (ref 11.5–15.5)
WBC: 6.8 10*3/uL (ref 4.0–10.5)

## 2018-03-03 LAB — PHOSPHORUS: Phosphorus: 2.4 mg/dL — ABNORMAL LOW (ref 2.5–4.6)

## 2018-03-03 LAB — URINE CULTURE

## 2018-03-03 LAB — T4, FREE: FREE T4: 1.14 ng/dL — AB (ref 0.61–1.12)

## 2018-03-03 NOTE — Consult Note (Signed)
[facility] Nurse ostomy consult note Stoma type/location: RLQ ileal conduit Stomal assessment/size:  Slightly oval, red, moist with os at center 1 inch x 1 and 1/8 inch, minimally raised Peristomal assessment: patient and wife report periodic dermatitis at tape edge, today there is a 4cm x 2cm area of dry, flaking skin from 1-2 o'clock. Patient reports an irritation to which he applied Desitin Powder recently. Treatment options for stomal/peristomal skin:  One-piece pouching system with convexity fashioned to cover area; CeraPlus with ceramide technology covers irritated area in question.  If in house on Thursday morning, I will change pouch to reassess.  If he is discharged prior to that, wife will call when pouch is changed and report outcomes. Output: Clear yellow urine Ostomy pouching: 1pc. Convex ostomy pouching system  Education provided: A 24-hour moisturizing cream is provided to the midline incision to resolve dryness in that area. (Sween 24). Enrolled patient in [place] program: No  WOC nursing team will follow along while in house, and will remain available to this patient, the nursing and medical teams.  Thanks, Maudie Flakes, MSN, RN, [place], Arther Abbott  Pager# [phone]

## 2018-03-03 NOTE — Progress Notes (Signed)
PROGRESS NOTE    Kristopher Thompson  GMW:[id number] DOB: Apr 12, 1951 DOA: 03/01/2018 PCP: Lorene Dy, MD   Brief Narrative: Patient is a 67 year old male with past medical history significant for bladder/prostate cancer complicated by postoperative intra-abdominal abscess, bacteremia secondary to Klebsiella pneumonia ,urine leak is from surgical incision, recurrent sepsis, rectal tear requiring colostomy  and reversal.  Patient had his urostomy tube changed by IR 2 days ago .  After he went home he became fatigued and febrile.  He was noted to have a fever of 103 at home.  Patient brought to the emergency department for further evaluation. Patient is currently being treated for possible sepsis.  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    Currently hemodynamically stable.blood cultures have been negative this admission.discontinue vancomycin and continue with cefepime.   Lactic acid level is normal.  Procalcitonin reassuring.patient had a fever in the last 24 hours. If No fever tomorrow could potentially switch to Keflex Omnicef for another 7 days and follow-up with urology   Urostomy tube change:S/p exchange of (L)nephroureteral tube/stent 2 days ago.IR  Consulted, status post exchange of left nephroureteral tube on 4/20, no  further procedures recommended  this admission, no intervention planned. RN notified about crusting of skin around the urostomy bag site. consulted ostomy nurse.  History of bladder/ prostate cancer: s/p cystoprostatectomy/ileal conduit rope with abdominal perineal resection .Had rectal tear s/p  colostomy after rectal injury  08/08/17;  s/p urinoma drain placement on 10/04/17 by IR  s/p left PCN 10/07/17 and nephrostogram with ureteral stent placement on 10/24/2017 by IR. He follows with urology as an outpatient.  Low TSH:  TSH 0.103, free T4 1 0.14. Borderline elevated. Repeat in 6 weeks  DVT prophylaxis: SCD Code Status: Full Family Communication: Wife present at the bedside Disposition Plan:  Anticipate discharge in the next 24-48 hours pending cultures, transferred to telemetry   Consultants: IR  Procedures: None on this admission  Antimicrobials: Vancomycin and cefepime since 03/01/2018, dc vanc   Subjective: Patient seen and examined the bedside this morning.  Remains comfortable.  Afebrile till this morning.  Blood pressure stable  Objective: Vitals:   03/03/18 0200 03/03/18 0400 03/03/18 0500 03/03/18 0600  BP: 121/71 121/62  115/60  Pulse: 80 78  78  Resp: (!) 27 20  17   Temp: 100.1 F (37.8 C) 99.7 F (37.6 C)    TempSrc: Oral Oral    SpO2: 96% 97%  99%  Weight:   114 kg (251 lb 5.2 oz)   Height:        Intake/Output Summary (Last 24 hours) at 03/03/2018 0803 Last data filed at 03/03/2018 4403 Gross per 24 hour  Intake 2513.33 ml  Output 800 ml  Net 1713.33 ml   Filed Weights   03/01/18 1659 03/02/18 0415 03/03/18 0500  Weight: 97.5 kg (215 lb) 112.7 kg (248 lb 7.3 oz) 114 kg (251 lb 5.2 oz)    Examination:  General exam: Appears calm and comfortable ,Not in distress,average built HEENT:PERRL,Oral mucosa moist, Ear/Nose normal on gross exam Respiratory system: Bilateral equal air entry, normal vesicular breath sounds, no wheezes or crackles  Cardiovascular system: S1 & S2 heard, RRR.  No JVD, murmurs, rubs, gallops or clicks. No pedal edema. Gastrointestinal system: Abdomen is nondistended, soft and nontender. No organomegaly or masses felt. Normal bowel sounds heard.  Scars from previous surgery.  Right-sided urostomy. Central nervous system: Alert and oriented. No focal  neurological deficits. Extremities: No edema, no clubbing ,no cyanosis, distal peripheral pulses palpable. Skin: No rashes, lesions or ulcers,no icterus ,no pallor MSK: Normal muscle bulk,tone ,power Psychiatry: Judgement and insight appear normal. Mood & affect appropriate.     Data Reviewed: I have personally reviewed following labs and imaging studies  CBC: Recent Labs  Lab 03/01/18 1724 03/02/18 0331 03/03/18 0349  WBC 15.5* 13.3* 6.8  NEUTROABS 12.4*  --  4.9  HGB 13.8 11.9* 11.5*  HCT 42.8 37.6* 35.7*  MCV 75.9* 77.0* 76.0*  PLT 188 160 409*   Basic Metabolic Panel: Recent Labs  Lab 03/01/18 1724 03/02/18 0331 03/03/18 0349  NA 137 139  --   K 3.7 3.9  --   CL 105 111  --   CO2 22 23  --   GLUCOSE 112* 103*  --   BUN 18 16  --   CREATININE 1.04 0.94  --   CALCIUM 9.0 8.2*  --   MG  --  1.9  --   PHOS  --  2.3* 2.4*   GFR: Estimated Creatinine Clearance: 102.2 mL/min (by C-G formula based on SCr of 0.94 mg/dL). Liver Function Tests: Recent Labs  Lab 03/01/18 1724 03/02/18 0331  AST 15 10*  ALT 16* 14*  ALKPHOS 65 49  BILITOT 1.0 0.8  PROT 7.8 6.3*  ALBUMIN 4.1 3.3*   No results for input(s): LIPASE, AMYLASE in the last 168 hours. No results for input(s): AMMONIA in the last 168 hours. Coagulation Profile: Recent Labs  Lab 03/02/18 0331  INR 1.20   Cardiac Enzymes: No results for input(s): CKTOTAL, CKMB, CKMBINDEX, TROPONINI in the last 168 hours. BNP (last 3 results) No results for input(s): PROBNP in the last 8760 hours. HbA1C: No results for input(s): HGBA1C in the last 72 hours. CBG: No results for input(s): GLUCAP in the last 168 hours. Lipid Profile: No results for input(s): CHOL, HDL, LDLCALC, TRIG, CHOLHDL, LDLDIRECT in the last 72 hours. Thyroid Function Tests: Recent Labs    03/02/18 0331 03/03/18 0349  TSH 0.103*  --   FREET4  --  1.14*   Anemia Panel: No results for input(s): VITAMINB12, FOLATE, FERRITIN, TIBC, IRON,  RETICCTPCT in the last 72 hours. Sepsis Labs: Recent Labs  Lab 03/01/18 1731 03/02/18 0036 03/02/18 0331  PROCALCITON  --  <0.10  --   LATICACIDVEN 1.39 1.4 0.9    Recent Results (from the past 240 hour(s))  Blood Culture (routine x 2)     Status: None (Preliminary result)   Collection Time: 03/01/18  6:40 PM  Result Value Ref Range Status   Specimen Description BLOOD RIGHT ANTECUBITAL  Final   Special Requests   Final    BOTTLES DRAWN AEROBIC AND ANAEROBIC Blood Culture results may not be optimal due to an excessive volume of blood received in culture bottles   Culture PENDING  Incomplete   Report Status PENDING  Incomplete  Blood Culture (routine x 2)     Status: None (Preliminary result)   Collection Time: 03/01/18  6:45 PM  Result Value Ref Range Status   Specimen Description BLOOD RIGHT WRIST  Final   Special Requests   Final    BOTTLES DRAWN AEROBIC AND ANAEROBIC Blood Culture  adequate volume   Culture PENDING  Incomplete   Report Status PENDING  Incomplete  MRSA PCR Screening     Status: None   Collection Time: 03/02/18 12:52 AM  Result Value Ref Range Status   MRSA by PCR NEGATIVE NEGATIVE Final    Comment:        The GeneXpert MRSA Assay (FDA approved for NASAL specimens only), is one component of a comprehensive MRSA colonization surveillance program. It is not intended to diagnose MRSA infection nor to guide or monitor treatment for MRSA infections. Performed at [facility], [place] [address], [place],  [address]          Radiology Studies: Dg Chest 2 View  Result Date: 03/01/2018 CLINICAL DATA:  Fevers EXAM: CHEST - 2 VIEW COMPARISON:  02/07/2018 FINDINGS: The heart size and mediastinal contours are within normal limits. Both lungs are clear. The visualized skeletal structures are unremarkable. IMPRESSION: No active cardiopulmonary disease. Electronically Signed   By: Inez Catalina M.D.   On: 03/01/2018 17:24   Ct Abdomen  Pelvis W Contrast  Result Date: 03/01/2018 CLINICAL DATA:  Pt has a urostomy and had it changed on Friday along with the stent, they had more difficulty than usual placing it, feels weak and has had fever at home EXAM: CT ABDOMEN AND PELVIS WITH CONTRAST TECHNIQUE: Multidetector CT imaging of the abdomen and pelvis was performed using the standard protocol following bolus administration of intravenous contrast. CONTRAST:  One hundred ISOVUE-300 IOPAMIDOL (ISOVUE-300) INJECTION 61% COMPARISON:  CT of the abdomen and pelvis on 01/29/2018 FINDINGS: Lower chest: Small nodule is identified in the RIGHT middle lobe, measuring 4 millimeters and stable in appearance. A small nodule is 5 millimeters on image 21 of series 4 within the LEFT LOWER lobe and also stable. There is minimal subsegmental atelectasis or scarring bilaterally. Hepatobiliary: Liver is homogeneous without focal mass. Status post cholecystectomy. Pancreas: Stable appearance of low-attenuation lesion along the dorsum of the pancreas measuring 1 centimeter. Spleen: Normal in size without focal abnormality. Adrenals/Urinary Tract: Normal adrenal glands. Normal RIGHT kidney and ureter two RIGHT LOWER QUADRANT ileal conduit. There is a stent within the LEFT kidney, extending into RIGHT LOWER QUADRANT ileal conduit. Mild dilatation of the renal pelvis is stable. Status post cystectomy Stomach/Bowel: Small hiatal hernia. Stomach is otherwise normal in appearance. Small bowel loops are normal in appearance. There is a RIGHT LOWER QUADRANT parastomal hernia containing small bowel loops and mesenteric fat. Small ventral hernia contains nondilated small bowel loops. There is significant diverticular disease in the sigmoid colon, not associated with acute inflammation. Appendectomy. RIGHT LOWER QUADRANT ileal conduit. Vascular/Lymphatic: There is atherosclerotic calcification of the abdominal aorta not associated with aneurysm. There is normal vascular opacification  of the celiac axis, superior mesenteric artery, and inferior mesenteric artery. Normal appearance of the portal venous system and inferior vena cava. No retroperitoneal or mesenteric adenopathy. Reproductive: Prostatectomy. Other: No free pelvic fluid. Anterior abdominal wall postoperative changes. No free intraperitoneal air or abscess. Musculoskeletal: Degenerative changes in the spine. No suspicious lytic or blastic lesions are identified. IMPRESSION: 1. Unremarkable appearance of stent, ileal conduit, and LEFT kidney following ureterostomy exchange. 2. No evidence for abscess. 3. Stable appearance of small nodules in the lung bases. 4. Small hiatal hernia. 5. Stable appearance of small cystic lesion along the dorsum of the pancreas. Annual follow-up with CT or MRI has been recommended. 6.  Aortic atherosclerosis.  (ICD10-I70.0) 7. Prostatectomy. 8. Sigmoid diverticulosis without acute diverticulitis. 9. Stable  appearance of small anterior abdominal wall hernias. Electronically Signed   By: Nolon Nations M.D.   On: 03/01/2018 19:36        Scheduled Meds: . pantoprazole  40 mg Oral Daily  . phosphorus  500 mg Oral TID   Continuous Infusions: . sodium chloride 100 mL/hr at 03/03/18 0445  . ceFEPime (MAXIPIME) IV Stopped (03/03/18 7939)  . vancomycin Stopped (03/03/18 0243)     LOS: 2 days    Time spent: 25 mins.More than 50% of that time was spent in counseling and/or coordination of care.      Reyne Dumas, MD [facility] Pager [phone]  If 7PM-7AM, please contact night-coverage [email or web address] Password [facility] 03/03/2018, 8:03 AM

## 2018-03-04 LAB — PHOSPHORUS: Phosphorus: 2.4 mg/dL — ABNORMAL LOW (ref 2.5–4.6)

## 2018-03-04 LAB — CBC
HEMATOCRIT: 36.9 % — AB (ref 39.0–52.0)
HEMOGLOBIN: 11.7 g/dL — AB (ref 13.0–17.0)
MCH: 23.9 pg — ABNORMAL LOW (ref 26.0–34.0)
MCHC: 31.7 g/dL (ref 30.0–36.0)
MCV: 75.5 fL — ABNORMAL LOW (ref 78.0–100.0)
Platelets: 121 10*3/uL — ABNORMAL LOW (ref 150–400)
RBC: 4.89 MIL/uL (ref 4.22–5.81)
RDW: 15.2 % (ref 11.5–15.5)
WBC: 5.3 10*3/uL (ref 4.0–10.5)

## 2018-03-04 LAB — BASIC METABOLIC PANEL
ANION GAP: 9 (ref 5–15)
BUN: 15 mg/dL (ref 6–20)
CALCIUM: 8 mg/dL — AB (ref 8.9–10.3)
CHLORIDE: 112 mmol/L — AB (ref 101–111)
CO2: 22 mmol/L (ref 22–32)
Creatinine, Ser: 0.87 mg/dL (ref 0.61–1.24)
GFR calc non Af Amer: >60 mL/min (ref >60)
Glucose, Bld: 128 mg/dL — ABNORMAL HIGH (ref 65–99)
POTASSIUM: 3.5 mmol/L (ref 3.5–5.1)
Sodium: 143 mmol/L (ref 135–145)

## 2018-03-04 LAB — T3, FREE: T3 FREE: 1.8 pg/mL — AB (ref 2.0–4.4)

## 2018-03-04 MED ORDER — DOCUSATE SODIUM 100 MG PO CAPS: 200.0000 mg | ORAL_CAPSULE | Freq: Two times a day (BID) | ORAL | Status: DC | PRN

## 2018-03-04 MED ORDER — SENNA 8.6 MG PO TABS: 1.0000 | ORAL_TABLET | Freq: Every day | ORAL | Status: DC | PRN

## 2018-03-04 NOTE — Progress Notes (Signed)
Pharmacy Antibiotic Note  Kristopher Thompson is a 67 y.o. male admitted on 03/01/2018 with UTI.  Pharmacy consulted for cefepime and vancomycin dosing.  Plan: Cefepime 2 Gm IV q8h Vancomycin discontinued 4/22 Plan IV abx 1-2 more days, change to po  Height: 6' (182.9 cm) Weight: 251 lb 5.2 oz (114 kg) IBW/kg (Calculated) : 77.6  Temp (24hrs), Avg:99 F (37.2 C), Min:98.2 F (36.8 C), Max:100.4 F (38 C)  Recent Labs  Lab 03/01/18 1724 03/01/18 1731 03/02/18 0036 03/02/18 0331 03/03/18 0349 03/04/18 0312  WBC 15.5*  --   --  13.3* 6.8 5.3  CREATININE 1.04  --   --  0.94  --  0.87  LATICACIDVEN  --  1.39 1.4 0.9  --   --     Estimated Creatinine Clearance: 110.4 mL/min (by C-G formula based on SCr of 0.87 mg/dL).    Allergies  Allergen Reactions  . Demerol [Meperidine] Other (See Comments)    SEVERE NAUSEA    Antimicrobials this admission: 4/20 cefepime >>   4/21 vancomycin >> 4/22  Dose adjustments this admission:  Microbiology results: 4/20 BCx: ngtd 4/20 UCx: mult sp-final 4/21 MRSA PCR: neg   Thank you for allowing pharmacy to be a part of this patient's care.  Minda Ditto PharmD Pager [phone] 03/04/2018, 10:51 AM

## 2018-03-04 NOTE — Progress Notes (Signed)
[facility] PROGRESS NOTE    Progress Note  Kristopher Thompson  GHW:[id number] DOB: March 06, 1951 DOA: 03/01/2018 PCP: Lorene Dy, MD     Brief Narrative:   Kristopher Thompson is an 67 y.o. male past medical history of bladder and prostate cancer complicated by postoperative intra-abdominal abscess, with bacteremia due to Klebsiella, urine leak from surgical incision, with recurrent sepsis and a rectal tear requiring colostomy and reversal.  Patient had a urostomy exchange on 03/01/2018, after he went home he came febrile and fatigue in the emergency room he was started on empiric treatment for sepsis, blood cultures have been inconclusive till date, will continue antibiotics for an additional 24-48 hours and then switch him to oral antibiotics.  Assessment/Plan:   Sepsis [facility]): He is currently hemodynamically stable, was started on broad-spectrum antibiotics IV vancomycin and cefepime and fluid resuscitated. Blood cultures are negative till date, urine culture showed multiple species. He spiked a temperature yesterday of  100.4, blood pressure has remained stable, leukocytosis has resolved. KVO IV fluids.  Urostomy tube exchange: Status post exchange in 03/01/2018, interventional radiology was consulted they recommended no further intervention at this point.  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 Had a rectal tear status post colostomy after rectal injury on 08/08/2017, status post urinoma drain placement on 10/04/2017 by IR status post left PCN on 10/07/2017 Will need further follow-up with urology as an outpatient.  Low TSH: In the setting of sepsis and recent surgical intervention, will need to be repeated in 6 weeks.  Hypophosphatemia: Check phosphorus this morning.  DVT prophylaxis: lovenox Family Communication:wife Disposition Plan/Barrier to D/C: home  once culture back Code Status:     Code Status Orders  (From admission, onward)        Start     Ordered   03/01/18 2320  Full code  Continuous     03/01/18 2319    Code Status History    Date Active Date Inactive Code Status Order ID Comments User Context   01/08/2018 1909 01/10/2018 1317 Full Code [id number]  Leighton Ruff, MD Inpatient   10/04/2017 0247 10/14/2017 2017 Full Code [id number]  Oswald Hillock, MD Inpatient   08/08/2017 2053 08/14/2017 1445 Full Code [id number]  Raynelle Bring, MD Inpatient        IV Access:    Peripheral IV   Procedures and diagnostic studies:   No results found.   Medical Consultants:    None.  Anti-Infectives:   IV vancomycin and Zosyn.  Subjective:    Kristopher Thompson he relates his abdominal pain is improved, he feels much better is hungry and is tolerating his diet.  Objective:    Vitals:   03/03/18 2005 03/03/18 2158 03/03/18 2302 03/04/18 0343  BP:  122/68  137/76  Pulse:  70  70  Resp:  (!) 22  16  Temp: 98.4 F (36.9 C)  99.5 F (37.5 C)   TempSrc: Oral  Oral   SpO2:  100%  99%  Weight:      Height:        Intake/Output Summary (Last 24 hours) at 03/04/2018 0739 Last data filed at 03/03/2018 1500 Gross per 24 hour  Intake 966.67 ml  Output -  Net 966.67 ml   Filed Weights   03/01/18 1659 03/02/18 0415 03/03/18 0500  Weight: 97.5 kg (215 lb) 112.7 kg (248 lb 7.3 oz) 114 kg (251 lb 5.2 oz)  Exam: General exam: In no acute distress. Respiratory system: Good air movement and clear to auscultation. Cardiovascular system: S1 & S2 heard, RRR.  Gastrointestinal system: Abdomen is nondistended, soft and nontender.  Central nervous system: Alert and oriented. No focal neurological deficits. Extremities: No pedal edema. Skin: No rashes, lesions or ulcers Psychiatry: Judgement and insight appear normal. Mood & affect appropriate.    Data Reviewed:    Labs: Basic Metabolic Panel: Recent Labs  Lab  03/01/18 1724 03/02/18 0331 03/03/18 0349 03/04/18 0312  NA 137 139  --  143  K 3.7 3.9  --  3.5  CL 105 111  --  112*  CO2 22 23  --  22  GLUCOSE 112* 103*  --  128*  BUN 18 16  --  15  CREATININE 1.04 0.94  --  0.87  CALCIUM 9.0 8.2*  --  8.0*  MG  --  1.9  --   --   PHOS  --  2.3* 2.4*  --    GFR Estimated Creatinine Clearance: 110.4 mL/min (by C-G formula based on SCr of 0.87 mg/dL). Liver Function Tests: Recent Labs  Lab 03/01/18 1724 03/02/18 0331  AST 15 10*  ALT 16* 14*  ALKPHOS 65 49  BILITOT 1.0 0.8  PROT 7.8 6.3*  ALBUMIN 4.1 3.3*   No results for input(s): LIPASE, AMYLASE in the last 168 hours. No results for input(s): AMMONIA in the last 168 hours. Coagulation profile Recent Labs  Lab 03/02/18 0331  INR 1.20    CBC: Recent Labs  Lab 03/01/18 1724 03/02/18 0331 03/03/18 0349 03/04/18 0312  WBC 15.5* 13.3* 6.8 5.3  NEUTROABS 12.4*  --  4.9  --   HGB 13.8 11.9* 11.5* 11.7*  HCT 42.8 37.6* 35.7* 36.9*  MCV 75.9* 77.0* 76.0* 75.5*  PLT 188 160 141* 121*   Cardiac Enzymes: No results for input(s): CKTOTAL, CKMB, CKMBINDEX, TROPONINI in the last 168 hours. BNP (last 3 results) No results for input(s): PROBNP in the last 8760 hours. CBG: No results for input(s): GLUCAP in the last 168 hours. D-Dimer: No results for input(s): DDIMER in the last 72 hours. Hgb A1c: No results for input(s): HGBA1C in the last 72 hours. Lipid Profile: No results for input(s): CHOL, HDL, LDLCALC, TRIG, CHOLHDL, LDLDIRECT in the last 72 hours. Thyroid function studies: Recent Labs    03/02/18 0331  TSH 0.103*   Anemia work up: No results for input(s): VITAMINB12, FOLATE, FERRITIN, TIBC, IRON, RETICCTPCT in the last 72 hours. Sepsis Labs: Recent Labs  Lab 03/01/18 1724 03/01/18 1731 03/02/18 0036 03/02/18 0331 03/03/18 0349 03/04/18 0312  PROCALCITON  --   --  <0.10  --   --   --   WBC 15.5*  --   --  13.3* 6.8 5.3  LATICACIDVEN  --  1.39 1.4 0.9  --    --    Microbiology Recent Results (from the past 240 hour(s))  Urine culture     Status: Abnormal   Collection Time: 03/01/18  5:01 PM  Result Value Ref Range Status   Specimen Description   Final    URINE, CLEAN CATCH Performed at [facility], [place] [address], [place], [place] [address]    Special Requests   Final    NONE Performed at [facility], [place] [address] [place], [place] [address]    Culture MULTIPLE SPECIES PRESENT, SUGGEST RECOLLECTION (A)  Final   Report Status 03/03/2018 FINAL  Final  Blood  Culture (routine x 2)     Status: None (Preliminary result)   Collection Time: 03/01/18  6:40 PM  Result Value Ref Range Status   Specimen Description BLOOD RIGHT ANTECUBITAL  Final   Special Requests   Final    BOTTLES DRAWN AEROBIC AND ANAEROBIC Blood Culture results may not be optimal due to an excessive volume of blood received in culture bottles   Culture PENDING  Incomplete   Report Status PENDING  Incomplete  Blood Culture (routine x 2)     Status: None (Preliminary result)   Collection Time: 03/01/18  6:45 PM  Result Value Ref Range Status   Specimen Description BLOOD RIGHT WRIST  Final   Special Requests   Final    BOTTLES DRAWN AEROBIC AND ANAEROBIC Blood Culture adequate volume   Culture   Final    NO GROWTH 1 DAY Performed at [facility], [address] [place], [place] [address]    Report Status PENDING  Incomplete  MRSA PCR Screening     Status: None   Collection Time: 03/02/18 12:52 AM  Result Value Ref Range Status   MRSA by PCR NEGATIVE NEGATIVE Final    Comment:        The GeneXpert MRSA Assay (FDA approved for NASAL specimens only), is one component of a comprehensive MRSA colonization surveillance program. It is not intended to diagnose MRSA infection nor to guide or monitor treatment for MRSA infections. Performed at [facility], [place] [address] [place], [place] [address]       Medications:   . pantoprazole  40 mg Oral Daily   Continuous Infusions: . sodium chloride 100 mL/hr at 03/03/18 0445  . ceFEPime (MAXIPIME) IV 2 g (03/04/18 0527)     LOS: 3 days   [facility] Pager [phone]  *Please refer to [email or web address], password [email or web address] to get updated schedule on who will round on this patient, as hospitalists switch teams weekly. If 7PM-7AM, please contact night-coverage at [email or web address], password [email or web address] for any overnight needs.  03/04/2018, 7:39 AM

## 2018-03-05 MED ORDER — K PHOS MONO-SOD PHOS DI & MONO 155-852-130 MG PO TABS
500.0000 mg | ORAL_TABLET | Freq: Three times a day (TID) | ORAL | Status: AC
  Administered 2018-03-05 (×3): 500 mg via ORAL
  Filled 2018-03-05 (×3): qty 2

## 2018-03-05 MED ORDER — SULFAMETHOXAZOLE-TRIMETHOPRIM 800-160 MG PO TABS
1.0000 | ORAL_TABLET | Freq: Two times a day (BID) | ORAL | Status: DC
  Administered 2018-03-05 – 2018-03-06 (×3): 1 via ORAL
  Filled 2018-03-05 (×3): qty 1

## 2018-03-05 MED ORDER — SENNA 8.6 MG PO TABS: 1.0000 | ORAL_TABLET | Freq: Every day | ORAL | Status: DC | PRN

## 2018-03-05 MED ORDER — DOCUSATE SODIUM 100 MG PO CAPS: 200.0000 mg | ORAL_CAPSULE | Freq: Two times a day (BID) | ORAL | Status: DC | PRN

## 2018-03-05 NOTE — Progress Notes (Signed)
[facility] PROGRESS NOTE    Progress Note  Kristopher Thompson  RXV:[id number] DOB: 03-14-1951 DOA: 03/01/2018 PCP: Lorene Dy, MD     Brief Narrative:   Kristopher Thompson is an 67 y.o. male past medical history of bladder and prostate cancer complicated by postoperative intra-abdominal abscess, with bacteremia due to Klebsiella, urine leak from surgical incision, with recurrent sepsis and a rectal tear requiring colostomy and reversal.  Patient had a urostomy exchange on 03/01/2018, after he went home he came febrile and fatigue in the emergency room he was started on empiric treatment for sepsis, blood cultures have been inconclusive till date, will continue antibiotics for an additional 24-48 hours and then switch him to oral antibiotics.  Assessment/Plan:   Sepsis [facility]): He is currently hemodynamically stable, was started on broad-spectrum antibiotics IV vancomycin and cefepime and fluid resuscitated. Blood cultures are negative till date, urine culture showed multiple species. Patient has remained afebrile discontinue IV vancomycin and cefepime Bactrim.  His cover staph strep and gram-negative's.  Urostomy tube exchange: Status post exchange in 03/01/2018, interventional radiology was consulted they recommended no further intervention at this point.  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 Had a rectal tear status post colostomy after rectal injury on 08/08/2017, status post urinoma drain placement on 10/04/2017 by IR status post left PCN on 10/07/2017 Will need further follow-up with urology as an outpatient.  Low TSH: In the setting of sepsis and recent surgical intervention, will need to be repeated in 6 weeks.  Hypophosphatemia: Phosphorus continues to be low.  Recheck in am  DVT prophylaxis: lovenox Family Communication:wife Disposition Plan/Barrier to  D/C: home if he remains afebrile Code Status:     Code Status Orders  (From admission, onward)        Start     Ordered   03/01/18 2320  Full code  Continuous     03/01/18 2319    Code Status History    Date Active Date Inactive Code Status Order ID Comments User Context   01/08/2018 1909 01/10/2018 1317 Full Code [id number]  Leighton Ruff, MD Inpatient   10/04/2017 0247 10/14/2017 2017 Full Code [id number]  Oswald Hillock, MD Inpatient   08/08/2017 2053 08/14/2017 1445 Full Code [id number]  Raynelle Bring, MD Inpatient        IV Access:    Peripheral IV   Procedures and diagnostic studies:   No results found.   Medical Consultants:    None.  Anti-Infectives:   IV vancomycin and Zosyn.  Subjective:    Kristopher Thompson complaints feels great.  Objective:    Vitals:   03/04/18 2000 03/04/18 2100 03/04/18 2225 03/05/18 0652  BP:   124/74 110/81  Pulse: 82 74 69 62  Resp:   16 16  Temp:   98 F (36.7 C) 98 F (36.7 C)  TempSrc:   Oral Oral  SpO2: 95% 98% 100% 98%  Weight:   113.8 kg (250 lb 14.1 oz)   Height:   6\' 1"  (1.854 m)     Intake/Output Summary (Last 24 hours) at 03/05/2018 0914 Last data filed at 03/05/2018 0815 Gross per 24 hour  Intake 2126 ml  Output 3850 ml  Net -1724 ml   Filed Weights   03/02/18 0415 03/03/18 0500 03/04/18 2225  Weight: 112.7 kg (248 lb 7.3 oz) 114 kg (251 lb 5.2 oz) 113.8 kg (250 lb 14.1 oz)  Exam: General exam: In no acute distress. Respiratory system: Good air movement and clear to auscultation. Cardiovascular system: S1 & S2 heard, RRR.  Gastrointestinal system: Abdomen is nondistended, soft and nontender.  Central nervous system: Alert and oriented. No focal neurological deficits. Extremities: No pedal edema. Skin: No rashes, lesions or ulcers Psychiatry: Judgement and insight appear normal. Mood & affect appropriate.    Data Reviewed:    Labs: Basic Metabolic Panel: Recent Labs  Lab 03/01/18 1724  03/02/18 0331 03/03/18 0349 03/04/18 0312  NA 137 139  --  143  K 3.7 3.9  --  3.5  CL 105 111  --  112*  CO2 22 23  --  22  GLUCOSE 112* 103*  --  128*  BUN 18 16  --  15  CREATININE 1.04 0.94  --  0.87  CALCIUM 9.0 8.2*  --  8.0*  MG  --  1.9  --   --   PHOS  --  2.3* 2.4* 2.4*   GFR Estimated Creatinine Clearance: 111.9 mL/min (by C-G formula based on SCr of 0.87 mg/dL). Liver Function Tests: Recent Labs  Lab 03/01/18 1724 03/02/18 0331  AST 15 10*  ALT 16* 14*  ALKPHOS 65 49  BILITOT 1.0 0.8  PROT 7.8 6.3*  ALBUMIN 4.1 3.3*   No results for input(s): LIPASE, AMYLASE in the last 168 hours. No results for input(s): AMMONIA in the last 168 hours. Coagulation profile Recent Labs  Lab 03/02/18 0331  INR 1.20    CBC: Recent Labs  Lab 03/01/18 1724 03/02/18 0331 03/03/18 0349 03/04/18 0312  WBC 15.5* 13.3* 6.8 5.3  NEUTROABS 12.4*  --  4.9  --   HGB 13.8 11.9* 11.5* 11.7*  HCT 42.8 37.6* 35.7* 36.9*  MCV 75.9* 77.0* 76.0* 75.5*  PLT 188 160 141* 121*   Cardiac Enzymes: No results for input(s): CKTOTAL, CKMB, CKMBINDEX, TROPONINI in the last 168 hours. BNP (last 3 results) No results for input(s): PROBNP in the last 8760 hours. CBG: No results for input(s): GLUCAP in the last 168 hours. D-Dimer: No results for input(s): DDIMER in the last 72 hours. Hgb A1c: No results for input(s): HGBA1C in the last 72 hours. Lipid Profile: No results for input(s): CHOL, HDL, LDLCALC, TRIG, CHOLHDL, LDLDIRECT in the last 72 hours. Thyroid function studies: Recent Labs    03/03/18 0349  T3FREE 1.8*   Anemia work up: No results for input(s): VITAMINB12, FOLATE, FERRITIN, TIBC, IRON, RETICCTPCT in the last 72 hours. Sepsis Labs: Recent Labs  Lab 03/01/18 1724 03/01/18 1731 03/02/18 0036 03/02/18 0331 03/03/18 0349 03/04/18 0312  PROCALCITON  --   --  <0.10  --   --   --   WBC 15.5*  --   --  13.3* 6.8 5.3  LATICACIDVEN  --  1.39 1.4 0.9  --   --     Microbiology Recent Results (from the past 240 hour(s))  Urine culture     Status: Abnormal   Collection Time: 03/01/18  5:01 PM  Result Value Ref Range Status   Specimen Description   Final    URINE, CLEAN CATCH Performed at [facility], [place] [address], [place], [place] [address]    Special Requests   Final    NONE Performed at [facility], [place] [address], [place], [place] [address]    Culture MULTIPLE SPECIES PRESENT, SUGGEST RECOLLECTION (A)  Final   Report Status 03/03/2018 FINAL  Final  Blood Culture (routine  x 2)     Status: None (Preliminary result)   Collection Time: 03/01/18  6:40 PM  Result Value Ref Range Status   Specimen Description BLOOD RIGHT ANTECUBITAL  Final   Special Requests   Final    BOTTLES DRAWN AEROBIC AND ANAEROBIC Blood Culture results may not be optimal due to an excessive volume of blood received in culture bottles   Culture   Final    NO GROWTH 2 DAYS Performed at [place] [address] [place], [place] [address]    Report Status PENDING  Incomplete  Blood Culture (routine x 2)     Status: None (Preliminary result)   Collection Time: 03/01/18  6:45 PM  Result Value Ref Range Status   Specimen Description BLOOD RIGHT WRIST  Final   Special Requests   Final    BOTTLES DRAWN AEROBIC AND ANAEROBIC Blood Culture adequate volume   Culture   Final    NO GROWTH 2 DAYS Performed at [facility], [place] [address] [place], [place] [address]    Report Status PENDING  Incomplete  MRSA PCR Screening     Status: None   Collection Time: 03/02/18 12:52 AM  Result Value Ref Range Status   MRSA by PCR NEGATIVE NEGATIVE Final    Comment:        The GeneXpert MRSA Assay (FDA approved for NASAL specimens only), is one component of a comprehensive MRSA colonization surveillance program. It is not intended to diagnose MRSA infection nor to guide or monitor treatment for MRSA infections. Performed at  [facility], [place] [address], [place], [place] [address]      Medications:   . pantoprazole  40 mg Oral Daily   Continuous Infusions: . sodium chloride 10 mL/hr at 03/04/18 0804  . ceFEPime (MAXIPIME) IV Stopped (03/05/18 0654)     LOS: 4 days   [facility] Pager [phone]  *Please refer to [email or web address], password [email or web address] to get updated schedule on who will round on this patient, as hospitalists switch teams weekly. If 7PM-7AM, please contact night-coverage at [email or web address], password [email or web address] for any overnight needs.  03/05/2018, 9:14 AM

## 2018-03-06 LAB — BASIC METABOLIC PANEL
Anion gap: 10 (ref 5–15)
BUN: 19 mg/dL (ref 6–20)
CALCIUM: 8.5 mg/dL — AB (ref 8.9–10.3)
CO2: 24 mmol/L (ref 22–32)
CREATININE: 1.18 mg/dL (ref 0.61–1.24)
Chloride: 110 mmol/L (ref 101–111)
GFR calc Af Amer: >60 mL/min (ref >60)
GFR calc non Af Amer: >60 mL/min (ref >60)
Glucose, Bld: 129 mg/dL — ABNORMAL HIGH (ref 65–99)
Potassium: 3.5 mmol/L (ref 3.5–5.1)
Sodium: 144 mmol/L (ref 135–145)

## 2018-03-06 LAB — PHOSPHORUS: Phosphorus: 4.6 mg/dL (ref 2.5–4.6)

## 2018-03-06 MED ORDER — POLYETHYLENE GLYCOL 3350 17 G PO PACK: 17.0000 g | PACK | Freq: Every day | ORAL | 0 refills | Status: DC | PRN

## 2018-03-06 MED ORDER — SULFAMETHOXAZOLE-TRIMETHOPRIM 800-160 MG PO TABS: 1.0000 | ORAL_TABLET | Freq: Two times a day (BID) | ORAL | 0 refills | Status: DC

## 2018-03-06 NOTE — Consult Note (Signed)
[facility] Nurse ostomy follow up Stoma type/location: RLQ ileal conduit with (L) nephroureteral catheter in place Stomal assessment/size: Per Monday, slightly oval, red, moist with os at center.  1 inch x 1 and 1/8 inch and slightly budded. Peristomal assessment: Dermatitis at 1-2 o'clock is improved.  Patient and wife are pleased with appearance following application of Sween 24 moisturizer to the affected area. Pouch not changed today.  Changed following leak yesterday by patient and wife using commercial hair dryer brought in from home to warm skin barrier and patient's abdomen. They applied moisturizer yesterday and then re pouched and pouch is still intact today. Treatment options for stomal/peristomal skin: See above for yesterday, not treatment today. Output: clear yellow urine Ostomy pouching: 2pc. Flat pouching products form home in use today. Education provided: See above.  My contact information is provided so that they can keep in touch with me regarding skin condition. Enrolled patient in [facility] Discharge program:DUring a previous admission. Patent is established with a provider and has no difficulty in obtaining supplies.  Patient and wife are hopeful regarding discharge for today.  [facility] nursing team will not follow post discharge, but will remain available to this patient, the nursing and medical teams.   Thanks, Maudie Flakes, MSN, RN, [place], Ar[facility] Abbott  Pager# [phone]

## 2018-03-06 NOTE — Progress Notes (Signed)
Discharge instructions reviewed with patient. Patient verbalized understanding patient. Patient discharged via private vehicle.

## 2018-03-06 NOTE — Care Management Important Message (Signed)
Important Message  Patient Details  Name: Kristopher Thompson MRN: [id number] Date of Birth: September 14, 1951   Medicare Important Message Given:  Yes    Kerin Salen 03/06/2018, 11:58 AMImportant Message  Patient Details  Name: Kristopher Thompson MRN: [id number] Date of Birth: Dec 15, 1950   Medicare Important Message Given:  Yes    Kerin Salen 03/06/2018, 11:58 AM

## 2018-03-07 LAB — CULTURE, BLOOD (ROUTINE X 2)
CULTURE: NO GROWTH
CULTURE: NO GROWTH
SPECIAL REQUESTS: ADEQUATE

## 2018-03-11 NOTE — Discharge Summary (Signed)
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 Is follow-up with PCP in 1 week  Follow-up Information    Lorene Dy, MD. Schedule an appointment as soon as possible for a visit in 1 week(s).   Specialty:  Internal Medicine Contact information: [address] [place] [address] [phone]          Diet recommendation: Cardiac diet  Activity: The patient is advised to gradually reintroduce usual activities.  Discharge Condition: good  Code Status: Full code  History of present illness: As per the H and P dictated on admission, "Kristopher Thompson is a 67 y.o. male with medical history significant of bladder/ prostate cancer implicated by postoperative intra-abdominal abscess bacteremia secondary to Klebsiella pneumonia urine leakage from surgical incision recurrent sepsis.  Rectal tear requiring colostomy and then colostomy reversal    Presented with urostomy changed yesterday by IR seem to have had trouble placing.  Became fatigued and febrile after procedure yesterday.  Febrile up to 103.8 at home last night he had some Reiger's and generalized fatigue no cough.  Patient attempted to go back to work yesterday but was so tired he had to crawl severely lightheaded he feels like he is about a fall over every time he tries to ambulate."  Hospital Course:  Summary of his active problems in the hospital is as following. Sepsis [facility]): He is currently hemodynamically stable, was started on broad-spectrum antibiotics IV  vancomycin and cefepime and fluid resuscitated. Blood cultures are negative till date, urine culture showed multiple species. Patient has remained afebrile  discontinue IV vancomycin and cefepime Bactrim.   Switch to Bactrim to cover staph strep and gram-negative's.  Urostomy tube exchange: Status post exchange in 03/01/2018, interventional radiology was consulted they recommended no further intervention at this point.  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 Had a rectal tear status post colostomy after rectal injury on 08/08/2017, status post urinoma drain placement on 10/04/2017 by IR status post left PCN on 10/07/2017 Will need further follow-up with urology as an outpatient.  Low TSH: In the setting of sepsis and recent surgical intervention, will need to be repeated in 6 weeks.  Hypophosphatemia: Phosphorus was low, now better.  All other chronic medical condition were stable during the hospitalization.  Patient was ambulatory without any assistance. On the day of the discharge the patient's vitals were stable, and no other acute medical condition were reported by patient. the patient was felt safe to be discharge at home with family.  Procedures and Results:  none   Consultations:  IR  DISCHARGE MEDICATION: Allergies as of 03/06/2018      Reactions   Demerol [meperidine] Other (See Comments)   SEVERE NAUSEA      Medication List    TAKE these medications   acetaminophen 500 MG tablet Commonly known as:  TYLENOL Take 1,000 mg by mouth every 6 (six) hours as needed for mild pain.   docusate sodium 100 MG capsule Commonly  known as:  COLACE Take 2 capsules (200 mg total) by mouth 2 (two) times daily as needed for mild constipation.   esomeprazole 20 MG capsule Commonly known as:  NEXIUM Take 20 mg by mouth daily as needed (heartburn).   FISH OIL  PO Take 1 tablet by mouth daily.   HYDROcodone-acetaminophen 5-325 MG tablet Commonly known as:  NORCO/VICODIN Take 1-2 tablets by mouth every 4 (four) hours as needed for moderate pain or severe pain.   ibuprofen 200 MG tablet Commonly known as:  ADVIL,MOTRIN Take 600 mg by mouth every 6 (six) hours as needed for moderate pain.   ibuprofen 400 MG tablet Commonly known as:  ADVIL,MOTRIN Take 0.5-1 tablets (200-400 mg total) by mouth every 6 (six) hours as needed for fever, headache or mild pain.   multivitamin with minerals Tabs tablet Take 1 tablet by mouth daily.   polyethylene glycol packet Commonly known as:  MIRALAX / GLYCOLAX Take 17 g by mouth daily as needed for mild constipation.   senna 8.6 MG Tabs tablet Commonly known as:  SENOKOT Take 1 tablet (8.6 mg total) by mouth daily as needed for mild constipation.   sulfamethoxazole-trimethoprim 800-160 MG tablet Commonly known as:  BACTRIM DS,SEPTRA DS Take 1 tablet by mouth every 12 (twelve) hours.   VITAMIN B-12 PO Take 1 tablet by mouth every other day.   VITAMIN D PO Take 1 tablet by mouth every other day.      Allergies  Allergen Reactions  . Demerol [Meperidine] Other (See Comments)    SEVERE NAUSEA   Discharge Instructions    Diet - low sodium heart healthy   Complete by:  As directed    Discharge instructions   Complete by:  As directed    It is important that you read following instructions as well as go over your medication list with RN to help you understand your care after this hospitalization.  Discharge Instructions: Please follow-up with PCP in one week  Please request your primary care physician to go over all Hospital Tests and Procedure/Radiological results at the follow up,  Please get all Hospital records sent to your PCP by signing hospital release before you go home.   Do not take more than prescribed Pain, Sleep and Anxiety Medications. You were cared for by a hospitalist during  your hospital stay. If you have any questions about your discharge medications or the care you received while you were in the hospital after you are discharged, you can call the unit and ask to speak with the hospitalist on call if the hospitalist that took care of you is not available.  Once you are discharged, your primary care physician will handle any further medical issues. Please note that NO REFILLS for any discharge medications will be authorized once you are discharged, as it is imperative that you return to your primary care physician (or establish a relationship with a primary care physician if you do not have one) for your aftercare needs so that they can reassess your need for medications and monitor your lab values. You Must read complete instructions/literature along with all the possible adverse reactions/side effects for all the Medicines you take and that have been prescribed to you. Take any new Medicines after you have completely understood and accept all the possible adverse reactions/side effects. Wear Seat belts while driving. If you have smoked or chewed Tobacco in the last 2 yrs please stop smoking and/or stop any Recreational drug use.   Increase  activity slowly   Complete by:  As directed      Discharge Exam: Filed Weights   03/02/18 0415 03/03/18 0500 03/04/18 2225  Weight: 112.7 kg (248 lb 7.3 oz) 114 kg (251 lb 5.2 oz) 113.8 kg (250 lb 14.1 oz)   Vitals:   03/06/18 0544 03/06/18 1312  BP: 124/74 120/78  Pulse: 71 65  Resp: 16   Temp: 98 F (36.7 C) 98 F (36.7 C)  SpO2: 98% 99%   General: Appear in no distress, no Rash; Oral Mucosa moist. Cardiovascular: S1 and S2 Present, no Murmur, no JVD Respiratory: Bilateral Air entry present and Clear to Auscultation, no Crackles, no wheezes Abdomen: Bowel Sound present, Soft and no tenderness Extremities: no Pedal edema, no calf tenderness Neurology: Grossly no focal neuro deficit.  The results of significant  diagnostics from this hospitalization (including imaging, microbiology, ancillary and laboratory) are listed below for reference.    Significant Diagnostic Studies: Dg Chest 2 View  Result Date: 03/01/2018 CLINICAL DATA:  Fevers EXAM: CHEST - 2 VIEW COMPARISON:  02/07/2018 FINDINGS: The heart size and mediastinal contours are within normal limits. Both lungs are clear. The visualized skeletal structures are unremarkable. IMPRESSION: No active cardiopulmonary disease. Electronically Signed   By: Inez Catalina M.D.   On: 03/01/2018 17:24   Ct Abdomen Pelvis W Contrast  Result Date: 03/01/2018 CLINICAL DATA:  Pt has a urostomy and had it changed on Friday along with the stent, they had more difficulty than usual placing it, feels weak and has had fever at home EXAM: CT ABDOMEN AND PELVIS WITH CONTRAST TECHNIQUE: Multidetector CT imaging of the abdomen and pelvis was performed using the standard protocol following bolus administration of intravenous contrast. CONTRAST:  One hundred ISOVUE-300 IOPAMIDOL (ISOVUE-300) INJECTION 61% COMPARISON:  CT of the abdomen and pelvis on 01/29/2018 FINDINGS: Lower chest: Small nodule is identified in the RIGHT middle lobe, measuring 4 millimeters and stable in appearance. A small nodule is 5 millimeters on image 21 of series 4 within the LEFT LOWER lobe and also stable. There is minimal subsegmental atelectasis or scarring bilaterally. Hepatobiliary: Liver is homogeneous without focal mass. Status post cholecystectomy. Pancreas: Stable appearance of low-attenuation lesion along the dorsum of the pancreas measuring 1 centimeter. Spleen: Normal in size without focal abnormality. Adrenals/Urinary Tract: Normal adrenal glands. Normal RIGHT kidney and ureter two RIGHT LOWER QUADRANT ileal conduit. There is a stent within the LEFT kidney, extending into RIGHT LOWER QUADRANT ileal conduit. Mild dilatation of the renal pelvis is stable. Status post cystectomy Stomach/Bowel: Small  hiatal hernia. Stomach is otherwise normal in appearance. Small bowel loops are normal in appearance. There is a RIGHT LOWER QUADRANT parastomal hernia containing small bowel loops and mesenteric fat. Small ventral hernia contains nondilated small bowel loops. There is significant diverticular disease in the sigmoid colon, not associated with acute inflammation. Appendectomy. RIGHT LOWER QUADRANT ileal conduit. Vascular/Lymphatic: There is atherosclerotic calcification of the abdominal aorta not associated with aneurysm. There is normal vascular opacification of the celiac axis, superior mesenteric artery, and inferior mesenteric artery. Normal appearance of the portal venous system and inferior vena cava. No retroperitoneal or mesenteric adenopathy. Reproductive: Prostatectomy. Other: No free pelvic fluid. Anterior abdominal wall postoperative changes. No free intraperitoneal air or abscess. Musculoskeletal: Degenerative changes in the spine. No suspicious lytic or blastic lesions are identified. IMPRESSION: 1. Unremarkable appearance of stent, ileal conduit, and LEFT kidney following ureterostomy exchange. 2. No evidence for abscess. 3. Stable appearance of small nodules in the  lung bases. 4. Small hiatal hernia. 5. Stable appearance of small cystic lesion along the dorsum of the pancreas. Annual follow-up with CT or MRI has been recommended. 6.  Aortic atherosclerosis.  (ICD10-I70.0) 7. Prostatectomy. 8. Sigmoid diverticulosis without acute diverticulitis. 9. Stable appearance of small anterior abdominal wall hernias. Electronically Signed   By: Nolon Nations M.D.   On: 03/01/2018 19:36   Ir Catheter Tube Change  Result Date: 02/28/2018 CLINICAL DATA:  Bladder carcinoma status post cystectomy and ileal conduit creation. Urinary leak with nephroureteral catheter placement. Presents for scheduled exchange. EXAM: LEFT RETROGRADE NEPHROURETERAL  CATHETER EXCHANGE UNDER FLUOROSCOPY FLUOROSCOPY TIME:  5.5  MINUTES; 4298 uGym2 DAP TECHNIQUE: The procedure, risks (including but not limited to bleeding, infection, organ damage ), benefits, and alternatives were explained to the patient. Questions regarding the procedure were encouraged and answered. The patient understands and consents to the procedure. The nephroureteral catheter and surrounding ostomy were prepped with Betadine, draped in usual sterile fashion. A small amount of contrast was injected through the nephroureteral catheter to opacify the renal collecting system. The catheter was cut and exchanged over a 5.027" stiff hydrophilic angiographic wire for a new 12-French pigtail catheter, formed centrally within the collecting system under fluoroscopy. FAIR AMOUNT of deposits on the catheter requiring use of coaxial 4 French glide catheter for ease of removal. Contrast injection confirms appropriate positioning. The patient tolerated the procedure well. COMPLICATIONS: COMPLICATIONS None. IMPRESSION: 1. Technically successful exchange of 12 French left retrograde nephroureteral catheter under fluoroscopy. Shorten scheduled interval to 5 weeks to facilitate exchange. Electronically Signed   By: Lucrezia Europe M.D.   On: 02/28/2018 11:07    Microbiology: Recent Results (from the past 240 hour(s))  Blood Culture (routine x 2)     Status: None   Collection Time: 03/01/18  6:40 PM  Result Value Ref Range Status   Specimen Description BLOOD RIGHT ANTECUBITAL  Final   Special Requests   Final    BOTTLES DRAWN AEROBIC AND ANAEROBIC Blood Culture results may not be optimal due to an excessive volume of blood received in culture bottles   Culture   Final    NO GROWTH 5 DAYS Performed at [facility], [place] [address] [place], [place] [address]    Report Status 03/07/2018 FINAL  Final  Blood Culture (routine x 2)     Status: None   Collection Time: 03/01/18  6:45 PM  Result Value Ref Range Status   Specimen Description BLOOD RIGHT WRIST  Final   Special  Requests   Final    BOTTLES DRAWN AEROBIC AND ANAEROBIC Blood Culture adequate volume   Culture   Final    NO GROWTH 5 DAYS Performed at [facility], [place] [address] [place], [place] [address]    Report Status 03/07/2018 FINAL  Final  MRSA PCR Screening     Status: None   Collection Time: 03/02/18 12:52 AM  Result Value Ref Range Status   MRSA by PCR NEGATIVE NEGATIVE Final    Comment:        The GeneXpert MRSA Assay (FDA approved for NASAL specimens only), is one component of a comprehensive MRSA colonization surveillance program. It is not intended to diagnose MRSA infection nor to guide or monitor treatment for MRSA infections. Performed at [facility], [place] [address], [place], [place] [address]      Labs: CBC: No results for input(s): WBC, NEUTROABS, HGB, HCT, MCV, PLT in the last 168 hours.  Basic Metabolic Panel: Recent Labs  Lab 03/06/18 0342  NA 144  K 3.5  CL 110  CO2 24  GLUCOSE 129*  BUN 19  CREATININE 1.18  CALCIUM 8.5*  PHOS 4.6   Time spent: 35 minutes  Signed:  Berle Mull  [facility] 03/06/2018, 6:09 PM

## 2018-03-21 ENCOUNTER — Other Ambulatory Visit: Payer: Self-pay

## 2018-03-21 ENCOUNTER — Other Ambulatory Visit: Payer: Self-pay | Admitting: Gastroenterology

## 2018-03-21 ENCOUNTER — Encounter (HOSPITAL_COMMUNITY): Payer: Self-pay

## 2018-03-21 DIAGNOSIS — K317 Polyp of stomach and duodenum: Secondary | ICD-10-CM | POA: Diagnosis not present

## 2018-03-26 ENCOUNTER — Ambulatory Visit (HOSPITAL_COMMUNITY): Admission: RE | Admit: 2018-03-26 | Payer: Medicare Other | Source: Ambulatory Visit | Admitting: Gastroenterology

## 2018-03-26 SURGERY — ULTRASOUND, UPPER GI TRACT, ENDOSCOPIC
Anesthesia: Monitor Anesthesia Care

## 2018-04-08 ENCOUNTER — Other Ambulatory Visit (HOSPITAL_COMMUNITY): Payer: Medicare Other

## 2018-04-09 ENCOUNTER — Other Ambulatory Visit: Payer: Self-pay | Admitting: Gastroenterology

## 2018-04-10 ENCOUNTER — Other Ambulatory Visit: Payer: Self-pay

## 2018-04-11 ENCOUNTER — Encounter (HOSPITAL_COMMUNITY): Payer: Self-pay | Admitting: Interventional Radiology

## 2018-04-11 ENCOUNTER — Other Ambulatory Visit (HOSPITAL_COMMUNITY): Payer: Self-pay | Admitting: Interventional Radiology

## 2018-04-11 ENCOUNTER — Ambulatory Visit (HOSPITAL_COMMUNITY)
Admission: RE | Admit: 2018-04-11 | Discharge: 2018-04-11 | Disposition: A | Discharge status: Home / Self Care | Payer: Medicare Other | Source: Ambulatory Visit | Attending: Urology | Admitting: Urology

## 2018-04-11 DIAGNOSIS — N135 Crossing vessel and stricture of ureter without hydronephrosis: Secondary | ICD-10-CM

## 2018-04-11 DIAGNOSIS — Y733 Surgical instruments, materials and gastroenterology and urology devices (including sutures) associated with adverse incidents: Secondary | ICD-10-CM | POA: Diagnosis not present

## 2018-04-11 DIAGNOSIS — Z906 Acquired absence of other parts of urinary tract: Secondary | ICD-10-CM | POA: Insufficient documentation

## 2018-04-11 DIAGNOSIS — T83038A Leakage of other indwelling urethral catheter, initial encounter: Secondary | ICD-10-CM | POA: Insufficient documentation

## 2018-04-11 DIAGNOSIS — N9989 Other postprocedural complications and disorders of genitourinary system: Secondary | ICD-10-CM | POA: Diagnosis not present

## 2018-04-11 DIAGNOSIS — C679 Malignant neoplasm of bladder, unspecified: Secondary | ICD-10-CM | POA: Insufficient documentation

## 2018-04-11 HISTORY — PX: IR NEPHROSTOMY TUBE CHANGE: IMG1442

## 2018-04-11 MED ORDER — CEFTRIAXONE SODIUM 2 G IJ SOLR
2.0000 g | Freq: Once | INTRAMUSCULAR | Status: AC
  Administered 2018-04-11: 2 g via INTRAVENOUS
  Filled 2018-04-11: qty 2

## 2018-04-11 MED ORDER — IOPAMIDOL (ISOVUE-300) INJECTION 61%
50.0000 mL | Freq: Once | INTRAVENOUS | Status: AC | PRN
  Administered 2018-04-11: 5 mL

## 2018-04-11 MED ORDER — IOPAMIDOL (ISOVUE-300) INJECTION 61%
INTRAVENOUS | Status: AC
  Administered 2018-04-11: 5 mL
  Filled 2018-04-11: qty 50

## 2018-04-14 ENCOUNTER — Encounter (HOSPITAL_COMMUNITY): Payer: Self-pay | Admitting: Radiology

## 2018-04-14 ENCOUNTER — Other Ambulatory Visit (HOSPITAL_COMMUNITY): Payer: Self-pay | Admitting: Urology

## 2018-04-14 DIAGNOSIS — N135 Crossing vessel and stricture of ureter without hydronephrosis: Secondary | ICD-10-CM

## 2018-04-15 ENCOUNTER — Other Ambulatory Visit: Payer: Self-pay | Admitting: Gastroenterology

## 2018-04-16 ENCOUNTER — Encounter (HOSPITAL_COMMUNITY)
Admission: RE | Disposition: A | Discharge status: Home / Self Care | Payer: Self-pay | Source: Ambulatory Visit | Attending: Gastroenterology

## 2018-04-16 ENCOUNTER — Ambulatory Visit (HOSPITAL_COMMUNITY): Payer: Medicare Other | Admitting: Certified Registered Nurse Anesthetist

## 2018-04-16 ENCOUNTER — Encounter (HOSPITAL_COMMUNITY): Payer: Self-pay | Admitting: Anesthesiology

## 2018-04-16 ENCOUNTER — Ambulatory Visit (HOSPITAL_COMMUNITY)
Admission: RE | Admit: 2018-04-16 | Discharge: 2018-04-16 | Disposition: A | Discharge status: Home / Self Care | Payer: Medicare Other | Source: Ambulatory Visit | Attending: Gastroenterology | Admitting: Gastroenterology

## 2018-04-16 DIAGNOSIS — Z8551 Personal history of malignant neoplasm of bladder: Secondary | ICD-10-CM | POA: Diagnosis not present

## 2018-04-16 DIAGNOSIS — Z6832 Body mass index (BMI) 32.0-32.9, adult: Secondary | ICD-10-CM | POA: Insufficient documentation

## 2018-04-16 DIAGNOSIS — N5201 Erectile dysfunction due to arterial insufficiency: Secondary | ICD-10-CM | POA: Insufficient documentation

## 2018-04-16 DIAGNOSIS — Z8546 Personal history of malignant neoplasm of prostate: Secondary | ICD-10-CM | POA: Insufficient documentation

## 2018-04-16 DIAGNOSIS — K219 Gastro-esophageal reflux disease without esophagitis: Secondary | ICD-10-CM | POA: Diagnosis not present

## 2018-04-16 DIAGNOSIS — E669 Obesity, unspecified: Secondary | ICD-10-CM | POA: Insufficient documentation

## 2018-04-16 DIAGNOSIS — K317 Polyp of stomach and duodenum: Secondary | ICD-10-CM | POA: Insufficient documentation

## 2018-04-16 DIAGNOSIS — Z6833 Body mass index (BMI) 33.0-33.9, adult: Secondary | ICD-10-CM | POA: Insufficient documentation

## 2018-04-16 DIAGNOSIS — Z87891 Personal history of nicotine dependence: Secondary | ICD-10-CM | POA: Insufficient documentation

## 2018-04-16 DIAGNOSIS — K862 Cyst of pancreas: Secondary | ICD-10-CM | POA: Insufficient documentation

## 2018-04-16 HISTORY — PX: EUS: SHX5427

## 2018-04-16 SURGERY — ULTRASOUND, UPPER GI TRACT, ENDOSCOPIC
Anesthesia: Monitor Anesthesia Care

## 2018-04-16 MED ORDER — SODIUM CHLORIDE 0.9 % IV SOLN: INTRAVENOUS | Status: DC

## 2018-04-16 MED ORDER — LACTATED RINGERS IV SOLN
INTRAVENOUS | Status: DC
  Administered 2018-04-16: 1000 mL via INTRAVENOUS

## 2018-04-16 MED ORDER — PROPOFOL 10 MG/ML IV BOLUS
INTRAVENOUS | Status: DC | PRN
  Administered 2018-04-16 (×5): 20 mg via INTRAVENOUS

## 2018-04-16 MED ORDER — LIDOCAINE 2% (20 MG/ML) 5 ML SYRINGE
INTRAMUSCULAR | Status: DC | PRN
  Administered 2018-04-16: 100 mg via INTRAVENOUS

## 2018-04-16 MED ORDER — PROPOFOL 10 MG/ML IV BOLUS
INTRAVENOUS | Status: AC
  Filled 2018-04-16: qty 60

## 2018-04-16 MED ORDER — ONDANSETRON HCL 4 MG/2ML IJ SOLN
INTRAMUSCULAR | Status: DC | PRN
  Administered 2018-04-16: 4 mg via INTRAVENOUS

## 2018-04-16 MED ORDER — PROPOFOL 500 MG/50ML IV EMUL
INTRAVENOUS | Status: DC | PRN
  Administered 2018-04-16: 100 ug/kg/min via INTRAVENOUS

## 2018-04-16 NOTE — Transfer of Care (Signed)
Immediate Anesthesia Transfer of Care Note  Patient: Kristopher Thompson  Procedure(s) Performed: FULL UPPER ENDOSCOPIC ULTRASOUND (EUS) RADIAL (N/A )  Patient Location: Endoscopy Unit  Anesthesia Type:MAC  Level of Consciousness: drowsy and patient cooperative  Airway & Oxygen Therapy: Patient Spontanous Breathing and Patient connected to nasal cannula oxygen  Post-op Assessment: Report given to RN and Post -op Vital signs reviewed and stable  Post vital signs: Reviewed and stable  Last Vitals:  Vitals Value Taken Time  BP 113/74 04/16/2018 10:50 AM  Temp 36.6 C 04/16/2018 10:49 AM  Pulse 70 04/16/2018 10:52 AM  Resp 17 04/16/2018 10:52 AM  SpO2 99 % 04/16/2018 10:52 AM  Vitals shown include unvalidated device data.  Last Pain:  Vitals:   04/16/18 1049  TempSrc: Oral  PainSc: 0-No pain         Complications: No apparent anesthesia complications

## 2018-04-16 NOTE — Discharge Instructions (Signed)
YOU HAD AN ENDOSCOPIC PROCEDURE TODAY: Refer to the procedure report and other information in the discharge instructions given to you for any specific questions about what was found during the examination. If this information does not answer your questions, please call [facility] office at [phone] to clarify.  ° °YOU SHOULD EXPECT: Some feelings of bloating in the abdomen. Passage of more gas than usual. Walking can help get rid of the air that was put into your GI tract during the procedure and reduce the bloating. If you had a lower endoscopy (such as a colonoscopy or flexible sigmoidoscopy) you may notice spotting of blood in your stool or on the toilet paper. Some abdominal soreness may be present for a day or two, also. ° °DIET: Your first meal following the procedure should be a light meal and then it is ok to progress to your normal diet. A half-sandwich or bowl of soup is an example of a good first meal. Heavy or fried foods are harder to digest and may make you feel nauseous or bloated. Drink plenty of fluids but you should avoid alcoholic beverages for 24 hours. If you had a esophageal dilation, please see attached instructions for diet.  ° °ACTIVITY: Your care partner should take you home directly after the procedure. You should plan to take it easy, moving slowly for the rest of the day. You can resume normal activity the day after the procedure however YOU SHOULD NOT DRIVE, use power tools, machinery or perform tasks that involve climbing or major physical exertion for 24 hours (because of the sedation medicines used during the test).  ° °SYMPTOMS TO REPORT IMMEDIATELY: °A gastroenterologist can be reached at any hour. Please call [phone]  for any of the following symptoms:  °  °Following upper endoscopy (EGD, EUS, ERCP, esophageal dilation) °Vomiting of blood or coffee ground material  °New, significant abdominal pain  °New, significant chest pain or pain under the shoulder blades  °Painful or  persistently difficult swallowing  °New shortness of breath  °Black, tarry-looking or red, bloody stools ° °FOLLOW UP:  °If any biopsies were taken you will be contacted by phone or by letter within the next 1-3 weeks. Call [phone]  if you have not heard about the biopsies in 3 weeks.  °Please also call with any specific questions about appointments or follow up tests. ° °

## 2018-04-16 NOTE — Anesthesia Preprocedure Evaluation (Addendum)
Anesthesia Evaluation  Patient identified by MRN, date of birth, ID band Patient awake    Reviewed: Allergy & Precautions, NPO status , Patient's Chart, lab work & pertinent test results  Airway Mallampati: II  TM Distance: >3 FB Neck ROM: Full    Dental no notable dental hx. (+) Teeth Intact   Pulmonary former smoker,    Pulmonary exam normal breath sounds clear to auscultation       Cardiovascular negative cardio ROS Normal cardiovascular exam+ dysrhythmias  Rhythm:Regular Rate:Normal     Neuro/Psych negative neurological ROS  negative psych ROS   GI/Hepatic hiatal hernia, GERD  Medicated and Controlled,Submucosal polyp of duodenum S/P Colostomy with reversal   Endo/Other  Obesity  Renal/GU Renal diseaseHx/o renal calculi   S/P radical prostatectomy and cystectomy with ileal conduit    Musculoskeletal negative musculoskeletal ROS (+)   Abdominal (+) + obese,   Peds  Hematology  (+) anemia ,   Anesthesia Other Findings   Reproductive/Obstetrics                            Anesthesia Physical Anesthesia Plan  ASA: II  Anesthesia Plan: MAC   Post-op Pain Management:    Induction: Intravenous  PONV Risk Score and Plan: 2 and Ondansetron, Propofol infusion and Treatment may vary due to age or medical condition  Airway Management Planned: Natural Airway and Nasal Cannula  Additional Equipment:   Intra-op Plan:   Post-operative Plan:   Informed Consent: I have reviewed the patients History and Physical, chart, labs and discussed the procedure including the risks, benefits and alternatives for the proposed anesthesia with the patient or authorized representative who has indicated his/her understanding and acceptance.   Dental advisory given  Plan Discussed with: CRNA and Surgeon  Anesthesia Plan Comments:        Anesthesia Quick Evaluation

## 2018-04-16 NOTE — Anesthesia Postprocedure Evaluation (Addendum)
Anesthesia Post Note  Patient: SHIVAAY STORMONT  Procedure(s) Performed: FULL UPPER ENDOSCOPIC ULTRASOUND (EUS) RADIAL (N/A )     Patient location during evaluation: PACU Anesthesia Type: MAC Level of consciousness: awake and alert Pain management: pain level controlled Vital Signs Assessment: post-procedure vital signs reviewed and stable Respiratory status: spontaneous breathing, nonlabored ventilation and respiratory function stable Cardiovascular status: blood pressure returned to baseline and stable Postop Assessment: no apparent nausea or vomiting Anesthetic complications: no    Last Vitals:  Vitals:   04/16/18 0931 04/16/18 1049  BP: 114/70 113/74  Pulse: (!) 56 74  Resp: 13 17  Temp: 36.4 C 36.6 C  SpO2: 100% 99%    Last Pain:  Vitals:   04/16/18 1049  TempSrc: Oral  PainSc: 0-No pain                 Duquan Gillooly,[place] A.

## 2018-04-16 NOTE — Op Note (Signed)
[facility] Patient Name: Kristopher Thompson Procedure Date: 04/16/2018 MRN: [id number] Attending MD: Arta Silence , MD Date of Birth: 1951-04-15 CSN: [id number] Age: 67 Admit Type: Outpatient Procedure:                Upper EUS Indications:              Duodenal mucosal mass/polyp found on endoscopy,                            Pancreatic cyst on CT scan Providers:                Arta Silence, MD, Carmie End, RN, Cherylynn Ridges, Technician, Cathe Mons, CRNA Referring MD:             Dr. Clarene Essex Medicines:                Monitored Anesthesia Care Complications:            No immediate complications. Estimated Blood Loss:     Estimated blood loss was minimal. Procedure:                Pre-Anesthesia Assessment:                           - Prior to the procedure, a History and Physical                            was performed, and patient medications and                            allergies were reviewed. The patient's tolerance of                            previous anesthesia was also reviewed. The risks                            and benefits of the procedure and the sedation                            options and risks were discussed with the patient.                            All questions were answered, and informed consent                            was obtained. Prior Anticoagulants: The patient has                            taken no previous anticoagulant or antiplatelet                            agents. ASA Grade Assessment: II - A patient with  mild systemic disease. After reviewing the risks                            and benefits, the patient was deemed in                            satisfactory condition to undergo the procedure.                           After obtaining informed consent, the endoscope was                            passed under direct vision. Throughout the              procedure, the patient's blood pressure, pulse, and                            oxygen saturations were monitored continuously. The                            [id number] ([id number]) scope was introduced through                            the mouth, and advanced to the second part of                            duodenum. The [id number] ([id number]) scope was                            introduced through the mouth, and advanced to the                            second part of duodenum. The upper EUS was                            accomplished without difficulty. The patient                            tolerated the procedure well. Scope In: Scope Out: Findings:      ENDOSCOPIC FINDING: :      A single medium-sized sessile polyp with no bleeding was found in the       very proximal duodenal bulb, just distal to the pylorus.      ENDOSONOGRAPHIC FINDING: :      Endosonographic images of the stomach were unremarkable.      There was diffuse abnormal echotexture in the left lobe of the liver.       This was characterized by a hyperechoic appearance, consistent with       hepatic steatosis.      There was no sign of significant endosonographic abnormality in the       ampulla.      Anechoic lesions, consistent with two cysts, were identified in the       pancreatic body, each of which contains rims of calcification. The       largest lesion measured around 17mm x 53mm in  maximal cross-sectional       diameter. There was no associated mass, mural nodule, or lymphadenopathy.      No lymphadenopathy seen.      A round intramural (subepithelial) lesion was found in the duodenal       bulb. The lesion was hypoechoic. Endosonographically, the lesion       appeared to originate from within the luminal interface/superficial       mucosa (Layer 1). The lesion also appeared to involve the following wall       layer(s): luminal interface/superficial mucosa (Layer 1). The lesion       also measured 9  mm by 11 mm in diameter. The outer margins were well       defined. Impression:               - A single duodenal polyp in the very proximal                            duodenal bulb, just distal to the pylorus.                            Mucosally-based on EUS; prior mucosal biopsies in                            2017 EGD benign; could be prominent Brunner's gland                            or some type of inflammatory/hyperplastic polyp. No                            worrisome EUS features for malignancy.                           - Endosonographic images of the stomach were                            unremarkable.                           - There was diffuse abnormal echotexture in the                            left lobe of the liver. This was characterized by a                            hyperechoic appearance.                           - There was no sign of significant pathology in the                            ampulla.                           - Two cystic lesions were seen in the pancreatic  body. Calcifications suggestive of chronicity.                           - An intramural (subepithelial) lesion was found in                            the duodenal bulb. The lesion appeared to originate                            from within the luminal interface/superficial                            mucosa (Layer 1). Moderate Sedation:      N/A- Per Anesthesia Care Recommendation:           - Discharge patient to home (via wheelchair).                           - Resume previous diet today.                           - Continue present medications.                           - Consider magnetic resonance imaging (MRI) with                            gadolinium in 1 year for surveillance of pancreatic                            cysts.                           - Return to GI clinic at appointment to be                            scheduled.                            - Consider repeat EGD in 1-2 years for surveillance                            of duodenal polyp, at discretion of Dr. Watt Climes.                           - Return to referring physician as previously                            scheduled. Procedure Code(s):        --- Professional ---                           [phone], Esophagogastroduodenoscopy, flexible,                            transoral; with endoscopic ultrasound examination,  including the esophagus, stomach, and either the                            duodenum or a surgically altered stomach where the                            jejunum is examined distal to the anastomosis Diagnosis Code(s):        --- Professional ---                           K31.7, Polyp of stomach and duodenum                           K86.2, Cyst of pancreas                           K31.89, Other diseases of stomach and duodenum                           R93.2, Abnormal findings on diagnostic imaging of                            liver and biliary tract CPT copyright 2017 [employer] Association. All rights reserved. The codes documented in this report are preliminary and upon coder review may  be revised to meet current compliance requirements. Arta Silence, MD 04/16/2018 10:57:03 AM This report has been signed electronically. Number of Addenda: 0

## 2018-04-16 NOTE — H&P (Signed)
Patient interval history reviewed.  Patient examined again.  There has been no change from documented H/P dated 03/21/18 (scanned into chart from our office) except as documented above.  Assessment:  1.  Duodenal polyp. 2.  Small pancreatic cyst.  Plan:  1.  Endoscopic ultrasound with possible fine needle aspiration biopsies. 2.  Risks (bleeding, infection, bowel perforation that could require surgery, sedation-related changes in cardiopulmonary systems), benefits (identification and possible treatment of source of symptoms, exclusion of certain causes of symptoms), and alternatives (watchful waiting, radiographic imaging studies, empiric medical treatment) of upper endoscopy with ultrasound and possible fine needle aspiration biopsies (EUS +/- FNA) were explained to patient/family in detail and patient wishes to proceed.

## 2018-04-17 ENCOUNTER — Encounter (HOSPITAL_COMMUNITY): Payer: Self-pay | Admitting: Gastroenterology

## 2018-04-17 NOTE — Addendum Note (Signed)
Addendum  created 04/17/18 1848 by Josephine Igo, MD   Sign clinical note, SmartForm saved

## 2018-05-19 ENCOUNTER — Ambulatory Visit (HOSPITAL_COMMUNITY)
Admission: RE | Admit: 2018-05-19 | Discharge: 2018-05-19 | Disposition: A | Discharge status: Home / Self Care | Payer: Medicare Other | Source: Ambulatory Visit | Attending: Interventional Radiology | Admitting: Interventional Radiology

## 2018-05-19 ENCOUNTER — Other Ambulatory Visit (HOSPITAL_COMMUNITY): Payer: Self-pay | Admitting: Interventional Radiology

## 2018-05-19 ENCOUNTER — Encounter (HOSPITAL_COMMUNITY): Payer: Self-pay | Admitting: Interventional Radiology

## 2018-05-19 DIAGNOSIS — N135 Crossing vessel and stricture of ureter without hydronephrosis: Secondary | ICD-10-CM | POA: Diagnosis not present

## 2018-05-19 DIAGNOSIS — Z436 Encounter for attention to other artificial openings of urinary tract: Secondary | ICD-10-CM | POA: Insufficient documentation

## 2018-05-19 HISTORY — PX: IR EXT NEPHROURETERAL CATH EXCHANGE: IMG5418

## 2018-05-19 MED ORDER — IOPAMIDOL (ISOVUE-300) INJECTION 61%: 50.0000 mL | Freq: Once | INTRAVENOUS | Status: DC | PRN

## 2018-05-19 MED ORDER — IOPAMIDOL (ISOVUE-300) INJECTION 61%
INTRAVENOUS | Status: AC
  Administered 2018-05-19: 10 mL
  Filled 2018-05-19: qty 50

## 2018-05-19 NOTE — Procedures (Signed)
Left ureteral obstruction  S/p left nehphroureteral cath exchg  No comp Stable Full report in pacs

## 2018-05-21 ENCOUNTER — Other Ambulatory Visit: Payer: Self-pay

## 2018-05-21 ENCOUNTER — Encounter (HOSPITAL_COMMUNITY): Payer: Self-pay | Admitting: *Deleted

## 2018-05-21 ENCOUNTER — Emergency Department (HOSPITAL_COMMUNITY)
Admission: EM | Admit: 2018-05-21 | Discharge: 2018-05-21 | Disposition: A | Discharge status: Home / Self Care | Payer: Medicare Other | Attending: Emergency Medicine | Admitting: Emergency Medicine

## 2018-05-21 DIAGNOSIS — M791 Myalgia, unspecified site: Secondary | ICD-10-CM | POA: Diagnosis not present

## 2018-05-21 DIAGNOSIS — Z87891 Personal history of nicotine dependence: Secondary | ICD-10-CM | POA: Diagnosis not present

## 2018-05-21 DIAGNOSIS — R5082 Postprocedural fever: Secondary | ICD-10-CM | POA: Diagnosis not present

## 2018-05-21 DIAGNOSIS — Z79899 Other long term (current) drug therapy: Secondary | ICD-10-CM | POA: Diagnosis not present

## 2018-05-21 LAB — COMPREHENSIVE METABOLIC PANEL
ALBUMIN: 3.7 g/dL (ref 3.5–5.0)
ALT: 11 U/L (ref 0–44)
ANION GAP: 7 (ref 5–15)
AST: 12 U/L — ABNORMAL LOW (ref 15–41)
Alkaline Phosphatase: 64 U/L (ref 38–126)
BUN: 23 mg/dL (ref 8–23)
CO2: 23 mmol/L (ref 22–32)
Calcium: 8.8 mg/dL — ABNORMAL LOW (ref 8.9–10.3)
Chloride: 109 mmol/L (ref 98–111)
Creatinine, Ser: 0.87 mg/dL (ref 0.61–1.24)
GFR calc non Af Amer: >60 mL/min (ref >60)
GLUCOSE: 120 mg/dL — AB (ref 70–99)
POTASSIUM: 3.8 mmol/L (ref 3.5–5.1)
SODIUM: 139 mmol/L (ref 135–145)
Total Bilirubin: 0.6 mg/dL (ref 0.3–1.2)
Total Protein: 7.3 g/dL (ref 6.5–8.1)

## 2018-05-21 LAB — URINALYSIS, ROUTINE W REFLEX MICROSCOPIC
BILIRUBIN URINE: NEGATIVE
Glucose, UA: NEGATIVE mg/dL
KETONES UR: NEGATIVE mg/dL
Nitrite: NEGATIVE
PROTEIN: 100 mg/dL — AB
Specific Gravity, Urine: 1.015 (ref 1.005–1.030)
pH: 5 (ref 5.0–8.0)

## 2018-05-21 LAB — CBC WITH DIFFERENTIAL/PLATELET
BASOS ABS: 0 10*3/uL (ref 0.0–0.1)
BASOS PCT: 0 %
Eosinophils Absolute: 0.3 10*3/uL (ref 0.0–0.7)
Eosinophils Relative: 2 %
HCT: 41.6 % (ref 39.0–52.0)
HEMOGLOBIN: 13.3 g/dL (ref 13.0–17.0)
LYMPHS PCT: 8 %
Lymphs Abs: 1.1 10*3/uL (ref 0.7–4.0)
MCH: 24.5 pg — AB (ref 26.0–34.0)
MCHC: 32 g/dL (ref 30.0–36.0)
MCV: 76.8 fL — AB (ref 78.0–100.0)
MONO ABS: 1.6 10*3/uL — AB (ref 0.1–1.0)
MONOS PCT: 12 %
NEUTROS ABS: 10.5 10*3/uL — AB (ref 1.7–7.7)
NEUTROS PCT: 78 %
Platelets: 211 10*3/uL (ref 150–400)
RBC: 5.42 MIL/uL (ref 4.22–5.81)
RDW: 15.8 % — ABNORMAL HIGH (ref 11.5–15.5)
WBC: 13.6 10*3/uL — ABNORMAL HIGH (ref 4.0–10.5)

## 2018-05-21 LAB — LACTIC ACID, PLASMA: Lactic Acid, Venous: 0.9 mmol/L (ref 0.5–1.9)

## 2018-05-21 MED ORDER — SULFAMETHOXAZOLE-TRIMETHOPRIM 800-160 MG PO TABS: 1.0000 | ORAL_TABLET | Freq: Two times a day (BID) | ORAL | 0 refills | Status: AC

## 2018-05-21 NOTE — ED Notes (Signed)
I collected the first set of blood cultures they are in the main lab

## 2018-05-21 NOTE — ED Provider Notes (Signed)
[facility] DEPT Provider Note   CSN: [id number] Arrival date & time: 05/21/18  0249     History   Chief Complaint Chief Complaint  Patient presents with  . Fever    HPI Kristopher Thompson is a 67 y.o. male.  The history is provided by the patient.  Fever   This is a new problem. Episode onset: Prior to arrival. The problem occurs constantly. The problem has been resolved. The maximum temperature noted was 100 to 100.9 F. Associated symptoms include muscle aches. Pertinent negatives include no chest pain, no diarrhea, no vomiting, no congestion, no headaches and no cough. He has tried nothing for the symptoms.   Patient with complicated urologic history presents with fever and body aches.  Patient had a nephroureteral catheter exchange done yesterday.  After going home he had a T-max up to 100.7.  He has reported body aches, no other acute complaints Denies vomiting/diarrhea.  No cough or shortness of breath.  No headache.  No chest pain or abdominal pain. Past Medical History:  Diagnosis Date  . At risk for sleep apnea    12-25-2017   STOP-BANG SCORE= 5   --- SENT TO PCP  . 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 Colostomy in place [facility])    since 08-08-2017-- per pt 12-25-2017 reddness around stoma  . GERD (gastroesophageal reflux disease)   . H/O hiatal hernia   . History of sepsis 09/2017   dx bacteremia due to klebsiella pneumoniae,  post op intraabdominal abscess  . Prostate cancer [facility]) urologist-- dr [name]   10-02-2012 s/p  prostatectomy-- Stage T1c  . RBBB   . Renal disorder    pt. denies  . Wears glasses     Patient Active Problem List   Diagnosis Date Noted  . 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 Candida infection   . Postoperative intra-abdominal abscess   . Abscess   . Fever 10/09/2017  . Bacteremia due to Klebsiella pneumoniae  10/09/2017  . Urine leakage from surgical incision   . Sepsis ([place]) 10/04/2017  . GERD (gastroesophageal reflux disease) 08/11/2017  . Obesity (BMI 30-39.9) 08/11/2017  . 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 08/08/2017 08/08/2017    Past Surgical History:  Procedure Laterality Date  . ABDOMINAL SURGERY    . APPENDECTOMY  1972  . CHOLECYSTECTOMY  1985  . COLOSTOMY REVERSAL N/A 01/08/2018   Procedure: COLOSTOMY REVERSAL;  Surgeon: Leighton Ruff, MD;  Location: [facility];  Service: General;  Laterality: N/A;  . CYSTOSCOPY WITH RETROGRADE PYELOGRAM, URETEROSCOPY AND STENT PLACEMENT Right 06/10/2017   Procedure: CYSTOSCOPY WITH RIGHT URETEROSCOPY WITH RIGHT STENT PLACEMENT;  Surgeon: Raynelle Bring, MD;  Location: [facility];  Service: Urology;  Laterality: Right;  . EUS N/A 04/16/2018   Procedure: FULL UPPER ENDOSCOPIC ULTRASOUND (EUS) RADIAL;  Surgeon: Arta Silence, MD;  Location: [facility];  Service: Endoscopy;  Laterality: N/A;  . FLEXIBLE SIGMOIDOSCOPY N/A 12/13/2017   Procedure: FLEXIBLE SIGMOIDOSCOPY;  Surgeon: Leighton Ruff, MD;  Location: [facility];  Service: Endoscopy;  Laterality: N/A;  . ILEO CONDUIT    . IR CATHETER TUBE CHANGE  12/13/2017  . IR CATHETER TUBE CHANGE  01/17/2018  . IR CATHETER TUBE CHANGE  02/28/2018  . IR CONVERT LEFT NEPHROSTOMY TO NEPHROURETERAL CATH  10/24/2017  .  IR EXT NEPHROURETERAL CATH EXCHANGE  05/19/2018  . IR NEPHRO TUBE REMOV/FL  10/24/2017  . IR NEPHROSTOMY PLACEMENT LEFT  10/07/2017  . IR NEPHROSTOMY TUBE CHANGE  04/11/2018  . ROBOT ASSISTED LAPAROSCOPIC RADICAL PROSTATECTOMY  10/02/2012   Procedure: ROBOTIC ASSISTED LAPAROSCOPIC RADICAL PROSTATECTOMY LEVEL 2;  Surgeon: Dutch Gray, MD;  Location: [facility];  Service: Urology;  Laterality: N/A;  . ROBOTIC ASSISTED LAPAROSCOPIC BLADDER DIVERTICULECTOMY N/A 08/08/2017   Procedure: XI ROBOTIC  ASSISTED LAPAROSCOPIC RADICAL CYSTECTOMY COVERTED TO OPEN PELVIC LYMPHADNECTOMY BILATERAL AND ILEAL CONDUIT URINARY DIVERSION;  Surgeon: Raynelle Bring, MD;  Location: [facility];  Service: Urology;  Laterality: N/A;  . TRANSURETHRAL RESECTION OF BLADDER TUMOR  06/10/2017   Procedure: TRANSURETHRAL RESECTION OF BLADDER TUMOR (TURBT);  Surgeon: Raynelle Bring, MD;  Location: [facility];  Service: Urology;;  . XI ROBOT ABDOMINAL PERINEAL RESECTION N/A 08/08/2017   Procedure: REPAIR OF RECTAL TEAR POSSIBLE PARTIAL PROCTECTOMY, CREATION OF  OSTOMY;  Surgeon: Leighton Ruff, MD;  Location: [facility];  Service: General;  Laterality: N/A;        Home Medications    Prior to Admission medications   Medication Sig Start Date End Date Taking? Authorizing Provider  acetaminophen (TYLENOL) 500 MG tablet Take 1,000 mg by mouth every 8 (eight) hours as needed for mild pain or headache.    Yes [provider]  bisacodyl (DULCOLAX) 5 MG EC tablet Take 10 mg by mouth daily as needed for moderate constipation.   Yes [provider]  Cholecalciferol (VITAMIN D PO) Take 1 capsule by mouth daily.   Yes [provider]  Cyanocobalamin (VITAMIN B-12 CR PO) Take 1 tablet by mouth daily.   Yes [provider]  D-MANNOSE PO Take 1 tablet by mouth daily.   Yes [provider]  esomeprazole (NEXIUM) 20 MG capsule Take 20 mg by mouth every 3 (three) days.    Yes [provider]  ibuprofen (ADVIL,MOTRIN) 200 MG tablet Take 600 mg by mouth daily as needed for headache or moderate pain.    Yes [provider]  KRILL OIL PO Take 1 capsule by mouth daily.   Yes [provider]  Multiple Vitamin (MULTIVITAMIN WITH MINERALS) TABS tablet Take 1 tablet by mouth daily.   Yes [provider]  senna (SENOKOT) 8.6 MG TABS tablet Take 1 tablet by mouth daily as needed for mild constipation.   Yes [provider]    Family History Family History  Problem  Relation Age of Onset  . Lung cancer Mother   . Hypertension Father   . Colon cancer Other   . CAD Neg Hx   . Diabetes Neg Hx   . Stroke Neg Hx     Social History Social History   Tobacco Use  . Smoking status: Former Smoker    Years: 9.00    Types: Cigarettes    Last attempt to quit: 12/25/1979    Years since quitting: 38.4  . Smokeless tobacco: Never Used  Substance Use Topics  . Alcohol use: Yes    Comment: occasional  . Drug use: No     Allergies   Demerol [meperidine]   Review of Systems Review of Systems  Constitutional: Positive for fever.  HENT: Negative for congestion.   Respiratory: Negative for cough.   Cardiovascular: Negative for chest pain.  Gastrointestinal: Negative for diarrhea and vomiting.  Neurological: Negative for headaches.  All other systems reviewed and are negative.    Physical Exam Updated Vital Signs  BP 126/70   Pulse 75   Temp 99 F (37.2 C) (Oral)   Resp 18   Ht 1.829 m (6')   Wt 113.4 kg (250 lb)   SpO2 95%   BMI 33.91 kg/m   Physical Exam CONSTITUTIONAL: Elderly, well-appearing HEAD: Normocephalic/atraumatic EYES: EOMI/PERRL ENMT: Mucous membranes moist NECK: supple no meningeal signs CV: S1/S2 noted, no murmurs/rubs/gallops noted LUNGS: Lungs are clear to auscultation bilaterally, no apparent distress ABDOMEN: soft, nontender, no rebound or guarding, bowel sounds noted throughout abdomen Urostomy bag in place GU:no cva tenderness NEURO: Pt is awake/alert/appropriate, moves all extremitiesx4.  No facial droop.   EXTREMITIES: pulses normal/equal, full ROM SKIN: warm, color normal PSYCH: no abnormalities of mood noted, alert and oriented to situation   ED Treatments / Results  Labs (all labs ordered are listed, but only abnormal results are displayed) Labs Reviewed  COMPREHENSIVE METABOLIC PANEL - Abnormal; Notable for the following components:      Result Value   Glucose, Bld 120 (*)    Calcium 8.8 (*)     AST 12 (*)    All other components within normal limits  CBC WITH DIFFERENTIAL/PLATELET - Abnormal; Notable for the following components:   WBC 13.6 (*)    MCV 76.8 (*)    MCH 24.5 (*)    RDW 15.8 (*)    Neutro Abs 10.5 (*)    Monocytes Absolute 1.6 (*)    All other components within normal limits  URINALYSIS, ROUTINE W REFLEX MICROSCOPIC - Abnormal; Notable for the following components:   APPearance CLOUDY (*)    Hgb urine dipstick MODERATE (*)    Protein, ur 100 (*)    Leukocytes, UA LARGE (*)    WBC, UA >50 (*)    Bacteria, UA MANY (*)    All other components within normal limits  URINE CULTURE  LACTIC ACID, PLASMA    EKG None  Radiology  Procedures Procedures   Medications Ordered in ED Medications - No data to display   Initial Impression / Assessment and Plan / ED Course  I have reviewed the triage vital signs and the nursing notes.  Pertinent labs  results that were available during my care of the patient were reviewed by me and considered in my medical decision making (see chart for details).     6:51 AM Patient presents for fever at home now resolved.  He recently had a nephroureteral catheter exchange.  Last time this occurred he did require admission.  Currently lactate is normal, and no fever on arrival. Patient is very well-appearing at this time Labs pending 7:11 AM Patient very well-appearing.  He denies any new complaints. He prefers to go home.  Repeat temp is less than 100.  He is not septic appearing.  He has no abdominal tenderness. However after further discussion, we will place him on Bactrim as he has been discharged on this previously.  Urine culture pending, but urinalysis findings were expected given his urostomy We discussed strict return precautions Final Clinical Impressions(s) / ED Diagnoses   Final diagnoses:  Post-procedural fever  Myalgia    ED Discharge Orders        Ordered    sulfamethoxazole-trimethoprim (BACTRIM  DS,SEPTRA DS) 800-160 MG tablet  2 times daily     05/21/18 0708       Ripley Fraise, MD 05/21/18 [phone]

## 2018-05-21 NOTE — Discharge Instructions (Addendum)
A fever means your temperature is over 100.4 F (37.8C). In adults this is most often due to viral or bacterial infections. If a fever lasts for over 2 or 3 days and does not get better with medicine, further medical examination is needed to find the cause.  SEEK MEDICAL CARE IF: Your temperature stays around 104 F (40 C) or if you have repeated vomiting, unable to take fluids, breathing problems, a severe headache, confusion, a new rash, abdominal pain, difficulty urinating, become poorly responsive, or any new symptoms.

## 2018-05-21 NOTE — ED Triage Notes (Addendum)
Pt had left nephoureteral cath exchange done yesterday. Pt says since last night he has felt achy and had tmax of 100.7. No nausea or vomiting. Last ibuprofen at 2100 and tylenol at 2130. Pt says that he developed an infection last time after the procedure and had to be hospitalized for a week.

## 2018-05-24 LAB — URINE CULTURE

## 2018-06-14 ENCOUNTER — Inpatient Hospital Stay (HOSPITAL_COMMUNITY)
Admission: EM | Admit: 2018-06-14 | Discharge: 2018-06-17 | DRG: 690 | Disposition: A | Discharge status: Home / Self Care | Payer: Medicare Other | Attending: Internal Medicine | Admitting: Internal Medicine

## 2018-06-14 ENCOUNTER — Encounter (HOSPITAL_COMMUNITY): Payer: Self-pay | Admitting: *Deleted

## 2018-06-14 ENCOUNTER — Other Ambulatory Visit: Payer: Self-pay

## 2018-06-14 DIAGNOSIS — Z9079 Acquired absence of other genital organ(s): Secondary | ICD-10-CM

## 2018-06-14 DIAGNOSIS — E875 Hyperkalemia: Secondary | ICD-10-CM | POA: Diagnosis present

## 2018-06-14 DIAGNOSIS — C679 Malignant neoplasm of bladder, unspecified: Secondary | ICD-10-CM | POA: Diagnosis present

## 2018-06-14 DIAGNOSIS — N10 Acute tubulo-interstitial nephritis: Secondary | ICD-10-CM | POA: Diagnosis not present

## 2018-06-14 DIAGNOSIS — K219 Gastro-esophageal reflux disease without esophagitis: Secondary | ICD-10-CM | POA: Diagnosis present

## 2018-06-14 DIAGNOSIS — K449 Diaphragmatic hernia without obstruction or gangrene: Secondary | ICD-10-CM | POA: Diagnosis present

## 2018-06-14 DIAGNOSIS — Z973 Presence of spectacles and contact lenses: Secondary | ICD-10-CM

## 2018-06-14 DIAGNOSIS — N12 Tubulo-interstitial nephritis, not specified as acute or chronic: Secondary | ICD-10-CM | POA: Diagnosis present

## 2018-06-14 DIAGNOSIS — D72829 Elevated white blood cell count, unspecified: Secondary | ICD-10-CM | POA: Diagnosis present

## 2018-06-14 DIAGNOSIS — Z936 Other artificial openings of urinary tract status: Secondary | ICD-10-CM

## 2018-06-14 DIAGNOSIS — Z79899 Other long term (current) drug therapy: Secondary | ICD-10-CM

## 2018-06-14 DIAGNOSIS — Z8249 Family history of ischemic heart disease and other diseases of the circulatory system: Secondary | ICD-10-CM

## 2018-06-14 DIAGNOSIS — Z87891 Personal history of nicotine dependence: Secondary | ICD-10-CM

## 2018-06-14 DIAGNOSIS — N179 Acute kidney failure, unspecified: Secondary | ICD-10-CM | POA: Diagnosis not present

## 2018-06-14 DIAGNOSIS — Z906 Acquired absence of other parts of urinary tract: Secondary | ICD-10-CM

## 2018-06-14 DIAGNOSIS — Z8551 Personal history of malignant neoplasm of bladder: Secondary | ICD-10-CM

## 2018-06-14 DIAGNOSIS — Z466 Encounter for fitting and adjustment of urinary device: Secondary | ICD-10-CM | POA: Diagnosis not present

## 2018-06-14 DIAGNOSIS — Z800 Family history of malignant neoplasm of digestive organs: Secondary | ICD-10-CM

## 2018-06-14 DIAGNOSIS — Z8546 Personal history of malignant neoplasm of prostate: Secondary | ICD-10-CM

## 2018-06-14 DIAGNOSIS — Z885 Allergy status to narcotic agent status: Secondary | ICD-10-CM

## 2018-06-14 DIAGNOSIS — Z9049 Acquired absence of other specified parts of digestive tract: Secondary | ICD-10-CM

## 2018-06-14 DIAGNOSIS — Z960 Presence of urogenital implants: Secondary | ICD-10-CM | POA: Diagnosis present

## 2018-06-14 LAB — CBC
HCT: 48.6 % (ref 39.0–52.0)
Hemoglobin: 16 g/dL (ref 13.0–17.0)
MCH: 25.6 pg — AB (ref 26.0–34.0)
MCHC: 32.9 g/dL (ref 30.0–36.0)
MCV: 77.6 fL — AB (ref 78.0–100.0)
Platelets: 170 10*3/uL (ref 150–400)
RBC: 6.26 MIL/uL — ABNORMAL HIGH (ref 4.22–5.81)
RDW: 16 % — AB (ref 11.5–15.5)
WBC: 18 10*3/uL — ABNORMAL HIGH (ref 4.0–10.5)

## 2018-06-14 LAB — BASIC METABOLIC PANEL
Anion gap: 11 (ref 5–15)
BUN: 20 mg/dL (ref 8–23)
CHLORIDE: 107 mmol/L (ref 98–111)
CO2: 23 mmol/L (ref 22–32)
CREATININE: 1.4 mg/dL — AB (ref 0.61–1.24)
Calcium: 9.3 mg/dL (ref 8.9–10.3)
GFR calc Af Amer: 59 mL/min — ABNORMAL LOW (ref >60)
GFR calc non Af Amer: 51 mL/min — ABNORMAL LOW (ref >60)
Glucose, Bld: 122 mg/dL — ABNORMAL HIGH (ref 70–99)
Potassium: 5.2 mmol/L — ABNORMAL HIGH (ref 3.5–5.1)
Sodium: 141 mmol/L (ref 135–145)

## 2018-06-14 NOTE — ED Triage Notes (Signed)
Pt arrived with c/o left abdominal and flank pain that started today. He has a urostomy and reports output is normal but it is cloudy. Temp was 100 at home tonight, no meds PTA. Hx of bladder CA, not on chemo or radiation.

## 2018-06-15 ENCOUNTER — Emergency Department (HOSPITAL_COMMUNITY): Payer: Medicare Other

## 2018-06-15 DIAGNOSIS — Z885 Allergy status to narcotic agent status: Secondary | ICD-10-CM | POA: Diagnosis not present

## 2018-06-15 DIAGNOSIS — K219 Gastro-esophageal reflux disease without esophagitis: Secondary | ICD-10-CM

## 2018-06-15 DIAGNOSIS — K449 Diaphragmatic hernia without obstruction or gangrene: Secondary | ICD-10-CM | POA: Diagnosis not present

## 2018-06-15 DIAGNOSIS — Z9049 Acquired absence of other specified parts of digestive tract: Secondary | ICD-10-CM | POA: Diagnosis not present

## 2018-06-15 DIAGNOSIS — C670 Malignant neoplasm of trigone of bladder: Secondary | ICD-10-CM

## 2018-06-15 DIAGNOSIS — Z973 Presence of spectacles and contact lenses: Secondary | ICD-10-CM | POA: Diagnosis not present

## 2018-06-15 DIAGNOSIS — N179 Acute kidney failure, unspecified: Secondary | ICD-10-CM | POA: Diagnosis present

## 2018-06-15 DIAGNOSIS — Z8546 Personal history of malignant neoplasm of prostate: Secondary | ICD-10-CM | POA: Diagnosis not present

## 2018-06-15 DIAGNOSIS — Z960 Presence of urogenital implants: Secondary | ICD-10-CM | POA: Diagnosis present

## 2018-06-15 DIAGNOSIS — Z8249 Family history of ischemic heart disease and other diseases of the circulatory system: Secondary | ICD-10-CM | POA: Diagnosis not present

## 2018-06-15 DIAGNOSIS — N390 Urinary tract infection, site not specified: Secondary | ICD-10-CM | POA: Diagnosis not present

## 2018-06-15 DIAGNOSIS — Z466 Encounter for fitting and adjustment of urinary device: Secondary | ICD-10-CM | POA: Diagnosis not present

## 2018-06-15 DIAGNOSIS — Z79899 Other long term (current) drug therapy: Secondary | ICD-10-CM | POA: Diagnosis not present

## 2018-06-15 DIAGNOSIS — Z906 Acquired absence of other parts of urinary tract: Secondary | ICD-10-CM | POA: Diagnosis not present

## 2018-06-15 DIAGNOSIS — D72829 Elevated white blood cell count, unspecified: Secondary | ICD-10-CM | POA: Diagnosis not present

## 2018-06-15 DIAGNOSIS — E875 Hyperkalemia: Secondary | ICD-10-CM | POA: Diagnosis not present

## 2018-06-15 DIAGNOSIS — Z936 Other artificial openings of urinary tract status: Secondary | ICD-10-CM

## 2018-06-15 DIAGNOSIS — N12 Tubulo-interstitial nephritis, not specified as acute or chronic: Secondary | ICD-10-CM | POA: Diagnosis present

## 2018-06-15 DIAGNOSIS — Z9079 Acquired absence of other genital organ(s): Secondary | ICD-10-CM | POA: Diagnosis not present

## 2018-06-15 DIAGNOSIS — Z800 Family history of malignant neoplasm of digestive organs: Secondary | ICD-10-CM | POA: Diagnosis not present

## 2018-06-15 DIAGNOSIS — N10 Acute tubulo-interstitial nephritis: Secondary | ICD-10-CM | POA: Diagnosis present

## 2018-06-15 DIAGNOSIS — Z87891 Personal history of nicotine dependence: Secondary | ICD-10-CM | POA: Diagnosis not present

## 2018-06-15 DIAGNOSIS — Z8551 Personal history of malignant neoplasm of bladder: Secondary | ICD-10-CM | POA: Diagnosis not present

## 2018-06-15 LAB — BASIC METABOLIC PANEL
ANION GAP: 8 (ref 5–15)
BUN: 21 mg/dL (ref 8–23)
CO2: 24 mmol/L (ref 22–32)
CREATININE: 1.3 mg/dL — AB (ref 0.61–1.24)
Calcium: 8.7 mg/dL — ABNORMAL LOW (ref 8.9–10.3)
Chloride: 109 mmol/L (ref 98–111)
GFR calc Af Amer: >60 mL/min (ref >60)
GFR, EST NON AFRICAN AMERICAN: 56 mL/min — AB (ref >60)
GLUCOSE: 108 mg/dL — AB (ref 70–99)
Potassium: 3.9 mmol/L (ref 3.5–5.1)
Sodium: 141 mmol/L (ref 135–145)

## 2018-06-15 LAB — CBC WITH DIFFERENTIAL/PLATELET
BASOS ABS: 0 10*3/uL (ref 0.0–0.1)
Basophils Relative: 0 %
EOS ABS: 0.2 10*3/uL (ref 0.0–0.7)
EOS PCT: 1 %
HCT: 48.4 % (ref 39.0–52.0)
Hemoglobin: 15.8 g/dL (ref 13.0–17.0)
Lymphocytes Relative: 10 %
Lymphs Abs: 1.6 10*3/uL (ref 0.7–4.0)
MCH: 25.6 pg — ABNORMAL LOW (ref 26.0–34.0)
MCHC: 32.6 g/dL (ref 30.0–36.0)
MCV: 78.3 fL (ref 78.0–100.0)
Monocytes Absolute: 2 10*3/uL — ABNORMAL HIGH (ref 0.1–1.0)
Monocytes Relative: 12 %
Neutro Abs: 13.2 10*3/uL — ABNORMAL HIGH (ref 1.7–7.7)
Neutrophils Relative %: 77 %
PLATELETS: 167 10*3/uL (ref 150–400)
RBC: 6.18 MIL/uL — AB (ref 4.22–5.81)
RDW: 16.2 % — ABNORMAL HIGH (ref 11.5–15.5)
WBC: 17 10*3/uL — AB (ref 4.0–10.5)

## 2018-06-15 LAB — CBC
HCT: 42.3 % (ref 39.0–52.0)
Hemoglobin: 13.7 g/dL (ref 13.0–17.0)
MCH: 25.2 pg — ABNORMAL LOW (ref 26.0–34.0)
MCHC: 32.4 g/dL (ref 30.0–36.0)
MCV: 77.9 fL — AB (ref 78.0–100.0)
PLATELETS: 164 10*3/uL (ref 150–400)
RBC: 5.43 MIL/uL (ref 4.22–5.81)
RDW: 16.1 % — AB (ref 11.5–15.5)
WBC: 14.1 10*3/uL — AB (ref 4.0–10.5)

## 2018-06-15 LAB — URINALYSIS, ROUTINE W REFLEX MICROSCOPIC
BILIRUBIN URINE: NEGATIVE
Glucose, UA: NEGATIVE mg/dL
Ketones, ur: NEGATIVE mg/dL
NITRITE: POSITIVE — AB
Protein, ur: 100 mg/dL — AB
Specific Gravity, Urine: 1.015 (ref 1.005–1.030)
WBC, UA: >50 WBC/hpf — ABNORMAL HIGH (ref 0–5)
pH: 7 (ref 5.0–8.0)

## 2018-06-15 LAB — I-STAT CG4 LACTIC ACID, ED: LACTIC ACID, VENOUS: 1.44 mmol/L (ref 0.5–1.9)

## 2018-06-15 LAB — PHOSPHORUS: Phosphorus: 3.9 mg/dL (ref 2.5–4.6)

## 2018-06-15 MED ORDER — SENNA 8.6 MG PO TABS: 1.0000 | ORAL_TABLET | Freq: Every day | ORAL | Status: DC | PRN

## 2018-06-15 MED ORDER — SODIUM CHLORIDE 0.9 % IV SOLN
1.0000 g | Freq: Once | INTRAVENOUS | Status: AC
  Administered 2018-06-15: 1 g via INTRAVENOUS
  Filled 2018-06-15: qty 10

## 2018-06-15 MED ORDER — SODIUM CHLORIDE 0.9 % IV BOLUS
1000.0000 mL | Freq: Once | INTRAVENOUS | Status: AC
  Administered 2018-06-15: 1000 mL via INTRAVENOUS

## 2018-06-15 MED ORDER — ENOXAPARIN SODIUM 40 MG/0.4ML ~~LOC~~ SOLN
40.0000 mg | Freq: Every day | SUBCUTANEOUS | Status: DC
  Administered 2018-06-15 – 2018-06-17 (×3): 40 mg via SUBCUTANEOUS
  Filled 2018-06-15 (×3): qty 0.4

## 2018-06-15 MED ORDER — PANTOPRAZOLE SODIUM 40 MG PO TBEC: 40.0000 mg | DELAYED_RELEASE_TABLET | Freq: Every day | ORAL | Status: DC | PRN

## 2018-06-15 MED ORDER — ACETAMINOPHEN 325 MG PO TABS
650.0000 mg | ORAL_TABLET | Freq: Four times a day (QID) | ORAL | Status: DC | PRN
  Administered 2018-06-15 – 2018-06-17 (×4): 650 mg via ORAL
  Filled 2018-06-15 (×4): qty 2

## 2018-06-15 MED ORDER — HYDROCODONE-ACETAMINOPHEN 5-325 MG PO TABS: 1.0000 | ORAL_TABLET | Freq: Four times a day (QID) | ORAL | Status: DC | PRN

## 2018-06-15 MED ORDER — ACETAMINOPHEN 650 MG RE SUPP: 650.0000 mg | Freq: Four times a day (QID) | RECTAL | Status: DC | PRN

## 2018-06-15 MED ORDER — SODIUM CHLORIDE 0.9 % IV SOLN
1.0000 g | INTRAVENOUS | Status: DC
  Administered 2018-06-15 – 2018-06-16 (×2): 1 g via INTRAVENOUS
  Filled 2018-06-15 (×2): qty 1

## 2018-06-15 MED ORDER — SODIUM CHLORIDE 0.9 % IV SOLN
INTRAVENOUS | Status: DC
  Administered 2018-06-15: 1 mL via INTRAVENOUS
  Administered 2018-06-15 – 2018-06-16 (×2): via INTRAVENOUS

## 2018-06-15 MED ORDER — ONDANSETRON HCL 4 MG PO TABS: 4.0000 mg | ORAL_TABLET | Freq: Four times a day (QID) | ORAL | Status: DC | PRN

## 2018-06-15 MED ORDER — ACETAMINOPHEN 325 MG PO TABS
650.0000 mg | ORAL_TABLET | Freq: Once | ORAL | Status: AC
  Administered 2018-06-15: 650 mg via ORAL
  Filled 2018-06-15: qty 2

## 2018-06-15 MED ORDER — ONDANSETRON HCL 4 MG/2ML IJ SOLN: 4.0000 mg | Freq: Four times a day (QID) | INTRAMUSCULAR | Status: DC | PRN

## 2018-06-15 NOTE — Progress Notes (Signed)
  PROGRESS NOTE  Patient admitted earlier this morning. See H&P. Kristopher Thompson is a 67 y.o. male with medical history significant of prostate cancer s/p radical prostatectomy in 09/2012, bladder cancer s/p  cystoprostatectomy/ileal conduitrope with abdominal perineal resection with rectal tear req colostomy after rectal injury 08/08/17,  delayed urine leak, s/p urinoma drain placement on 11/23/18by IR,s/p left PCN 10/07/17, andnephrostogram withureteral stent placement on 11/10/2017, and exchange of ureteral stent on 03/01/2018, 04/11/2018; who presents with complaints of left flank pain since yesterday.  Patient noted acute onset of sharp, throbbing left-sided flank pain.  Associated symptoms low-grade fevers with temperature measured up to 100 F at home, cloudy urine, and lower abdominal soreness.  CT revealed left kidney swelling, perinephric stranding, and pelvicaliectasis likely representing infection of the renal collecting system/pyelonephritis or stent dysfunction. The stent has migrated slightly downstream although still coiling within lower pole of left kidney. He was admitted for pyelonephritis and started on IV antibiotics.   Discussed with IR who reviewed imaging. They recommend flushing tube and will evaluate tomorrow if issues occur.  Continue IV antibiotics Rocephin. Monitor WBC.   Continue IVF and monitor Cr.  Urine culture pending.   Dessa Phi, DO [facility] [email or web address] Password [email or web address] 06/15/2018, 10:01 AM

## 2018-06-15 NOTE — ED Notes (Signed)
ED TO INPATIENT HANDOFF REPORT  Name/Age/Gender Kristopher Thompson 67 y.o. male  Code Status    Code Status Orders  (From admission, onward)        Start     Ordered   06/15/18 0402  Full code  Continuous     06/15/18 0404    Code Status History    Date Active Date Inactive Code Status Order ID Comments User Context   03/01/2018 2319 03/06/2018 1633 Full Code [id number]  Toy Baker, MD ED   01/08/2018 1909 01/10/2018 1317 Full Code [id number]  Leighton Ruff, MD Inpatient   10/04/2017 0247 10/14/2017 2017 Full Code [id number]  Oswald Hillock, MD Inpatient   08/08/2017 2053 08/14/2017 1445 Full Code [id number]  Raynelle Bring, MD Inpatient      Home/SNF/Other Home  Chief Complaint Kidney Pain / Fever / Urostomy pouch causing cloudy urine   Level of Care/Admitting Diagnosis ED Disposition    ED Disposition Condition [place] [facility]: [facility] [[id number]]  Level of Care: Med-Surg [16]  Diagnosis: Pyelonephritis [325498]  Admitting Physician: Norval Morton [[id number]]  Attending Physician: Norval Morton [[id number]]  Estimated length of stay: past midnight tomorrow  Certification:: I certify this patient will need inpatient services for at least 2 midnights  PT Class (Do Not Modify): Inpatient [101]  PT Acc Code (Do Not Modify): Private [1]       Medical History Past Medical History:  Diagnosis Date  . At risk for sleep apnea    12-25-2017   STOP-BANG SCORE= 5   --- SENT TO PCP  . 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 Colostomy in place [facility])    since 08-08-2017-- per pt 12-25-2017 reddness around stoma  . GERD (gastroesophageal reflux disease)   . H/O hiatal hernia   . History of sepsis 09/2017   dx bacteremia due to klebsiella pneumoniae,  post op intraabdominal abscess  . Prostate cancer [facility]) urologist-- dr [name]   10-02-2012 s/p   prostatectomy-- Stage T1c  . RBBB   . Renal disorder    pt. denies  . Wears glasses     Allergies Allergies  Allergen Reactions  . Demerol [Meperidine] Nausea And Vomiting and Other (See Comments)    SEVERE NAUSEA    IV Location/Drains/Wounds Patient Lines/Drains/Airways Status   Active Line/Drains/Airways    Name:   Placement date:   Placement time:   Site:   Days:   Peripheral IV 06/14/18 Left;Anterior Forearm   06/14/18    2310    Forearm   1   Urostomy Ureterostomy left RLQ   -    -    RLQ      Wound / Incision (Open or Dehisced) 10/04/17 Incision - Open Abdomen   10/04/17    1200    Abdomen   254          Labs/Imaging Results for orders placed or performed during the hospital encounter of 06/14/18 (from the past 48 hour(s))  Urinalysis, Routine w reflex microscopic- may I&O cath if menses     Status: Abnormal   Collection Time: 06/14/18 11:10 PM  Result Value Ref Range   Color, Urine AMBER (A) YELLOW    Comment: BIOCHEMICALS MAY BE AFFECTED BY COLOR   APPearance TURBID (A) CLEAR   Specific Gravity, Urine 1.015 1.005 - 1.030   pH 7.0 5.0 -  8.0   Glucose, UA NEGATIVE NEGATIVE mg/dL   Hgb urine dipstick MODERATE (A) NEGATIVE   Bilirubin Urine NEGATIVE NEGATIVE   Ketones, ur NEGATIVE NEGATIVE mg/dL   Protein, ur 100 (A) NEGATIVE mg/dL   Nitrite POSITIVE (A) NEGATIVE   Leukocytes, UA MODERATE (A) NEGATIVE   RBC / HPF 21-50 0 - 5 RBC/hpf   WBC, UA >50 (H) 0 - 5 WBC/hpf   Bacteria, UA FEW (A) NONE SEEN   WBC Clumps PRESENT    Mucus PRESENT     Comment: Performed at [facility], [place] [address], [place], [place] [address]  Basic metabolic panel     Status: Abnormal   Collection Time: 06/14/18 11:10 PM  Result Value Ref Range   Sodium 141 135 - 145 mmol/L   Potassium 5.2 (H) 3.5 - 5.1 mmol/L   Chloride 107 98 - 111 mmol/L   CO2 23 22 - 32 mmol/L   Glucose, Bld 122 (H) 70 - 99 mg/dL   BUN 20 8 - 23 mg/dL   Creatinine, Ser 1.40 (H) 0.61 - 1.24  mg/dL   Calcium 9.3 8.9 - 10.3 mg/dL   GFR calc non Af Amer 51 (L) >60 mL/min   GFR calc Af Amer 59 (L) >60 mL/min    Comment: (NOTE) The eGFR has been calculated using the CKD EPI equation. This calculation has not been validated in all clinical situations. eGFR's persistently <60 mL/min signify possible Chronic Kidney Disease.    Anion gap 11 5 - 15    Comment: Performed at [facility], [place] [address], [place], [place] [address]  CBC     Status: Abnormal   Collection Time: 06/14/18 11:10 PM  Result Value Ref Range   WBC 18.0 (H) 4.0 - 10.5 K/uL   RBC 6.26 (H) 4.22 - 5.81 MIL/uL   Hemoglobin 16.0 13.0 - 17.0 g/dL   HCT 48.6 39.0 - 52.0 %   MCV 77.6 (L) 78.0 - 100.0 fL   MCH 25.6 (L) 26.0 - 34.0 pg   MCHC 32.9 30.0 - 36.0 g/dL   RDW 16.0 (H) 11.5 - 15.5 %   Platelets 170 150 - 400 K/uL    Comment: Performed at [facility], [place] [address], [place],  [address]  CBC with Differential/Platelet     Status: Abnormal   Collection Time: 06/14/18 11:18 PM  Result Value Ref Range   WBC 17.0 (H) 4.0 - 10.5 K/uL   RBC 6.18 (H) 4.22 - 5.81 MIL/uL   Hemoglobin 15.8 13.0 - 17.0 g/dL   HCT 48.4 39.0 - 52.0 %   MCV 78.3 78.0 - 100.0 fL   MCH 25.6 (L) 26.0 - 34.0 pg   MCHC 32.6 30.0 - 36.0 g/dL   RDW 16.2 (H) 11.5 - 15.5 %   Platelets 167 150 - 400 K/uL    Comment: SPECIMEN CHECKED FOR CLOTS REPEATED TO VERIFY PLATELET COUNT CONFIRMED BY SMEAR    Neutrophils Relative % 77 %   Neutro Abs 13.2 (H) 1.7 - 7.7 K/uL   Lymphocytes Relative 10 %   Lymphs Abs 1.6 0.7 - 4.0 K/uL   Monocytes Relative 12 %   Monocytes Absolute 2.0 (H) 0.1 - 1.0 K/uL   Eosinophils Relative 1 %   Eosinophils Absolute 0.2 0.0 - 0.7 K/uL   Basophils Relative 0 %   Basophils Absolute 0.0 0.0 - 0.1 K/uL    Comment: Performed at [facility], [place]  [place], [place] [address]  I-Stat CG4 Lactic Acid, ED     Status: None   Collection Time:  06/15/18  1:07 AM  Result Value Ref Range   Lactic Acid, Venous 1.44 0.5 - 1.9 mmol/L   Ct Renal Stone Study  Result Date: 06/15/2018 CLINICAL DATA:  67 y/o M; left-sided abdominal pain with fever. Left nephroureteral stent placed 05/20/2018. History of bladder cancer. EXAM: CT ABDOMEN AND PELVIS WITHOUT CONTRAST TECHNIQUE: Multidetector CT imaging of the abdomen and pelvis was performed following the standard protocol without IV contrast. COMPARISON:  03/10/2012 CT abdomen and pelvis. FINDINGS: Lower chest: Stable 4 mm nodules at the lung bases. Small hiatal hernia. Hepatobiliary: No focal liver abnormality is seen. Status post cholecystectomy. No biliary dilatation. Pancreas: Stable 10 mm cyst within the pancreas body (series 2, image 26). No pancreatic ductal dilatation or surrounding inflammatory changes. Spleen: Normal in size without focal abnormality. Adrenals/Urinary Tract: The left-sided ureteral stent has migrated slightly downstream although still coiling within the lower pole of left kidney. There has been interval development of mild left pelvicaliectasis and moderate diffuse perinephric stranding as well as swelling of the left kidney. Normal right kidney and ureter. Right lower quadrant ileal conduit is stable. Stomach/Bowel: Stomach is within normal limits. Extensive sigmoid diverticulosis without findings of acute diverticulitis. No evidence of bowel wall thickening, distention, or inflammatory changes. Vascular/Lymphatic: Aortic atherosclerosis. No enlarged abdominal or pelvic lymph nodes. Reproductive: Prostatectomy. Other: Stable midline ventral abdominal hernia containing fat and small bowel as well as large hernia adjacent to the right lower quadrant ileal conduit containing fat and small bowel. There are no obstructive changes of the bowel or associated inflammation. Musculoskeletal: Interval age-indeterminate L1 superior endplate fracture with mild central loss of vertebral body height.  IMPRESSION: 1. Left kidney swelling, perinephric stranding, and pelvicaliectasis likely representing infection of the renal collecting system/pyelonephritis or stent dysfunction. The stent has migrated slightly downstream although still coiling within lower pole of left kidney. 2. Small hiatal hernia, ventral midline abdominal hernia, and hernia adjacent to the right lower quadrant ileal conduit. The ventral and right lower quadrant hernias contain fat and small bowel without obstruction or inflammation. 3. Stable pancreas body cystic lesion, annual follow-up recommended. 4. Aortic atherosclerosis. 5. Sigmoid diverticulosis without findings of acute diverticulitis. 6. Stable small nodules within the lung bases. Electronically Signed   By: Kristine Garbe M.D.   On: 06/15/2018 02:43    Pending Labs Unresulted Labs (From admission, onward)   Start     Ordered   06/15/18 0500  CBC  Tomorrow morning,   R     06/15/18 0404   06/15/18 1791  Basic metabolic panel  Tomorrow morning,   R     06/15/18 0404   06/15/18 0500  Phosphorus  Tomorrow morning,   R     06/15/18 0404   06/15/18 0017  Urine culture  STAT,   STAT     06/15/18 0017      Vitals/Pain Today's Vitals   06/15/18 0109 06/15/18 0115 06/15/18 0322 06/15/18 0324  BP: 130/82 (!) 143/79 100/76   Pulse: 98 97 (!) 102   Resp: 18  (!) 22   Temp:   98.6 F (37 C)   TempSrc:   Oral   SpO2: 97% 98% 97%   PainSc:    4     Isolation Precautions No active isolations  Medications Medications  enoxaparin (LOVENOX) injection 40 mg (has no administration in time range)  0.9 %  sodium  chloride infusion (has no administration in time range)  ondansetron (ZOFRAN) tablet 4 mg (has no administration in time range)    Or  ondansetron (ZOFRAN) injection 4 mg (has no administration in time range)  acetaminophen (TYLENOL) tablet 650 mg (has no administration in time range)    Or  acetaminophen (TYLENOL) suppository 650 mg (has no  administration in time range)  HYDROcodone-acetaminophen (NORCO/VICODIN) 5-325 MG per tablet 1 tablet (has no administration in time range)  cefTRIAXone (ROCEPHIN) 1 g in sodium chloride 0.9 % 100 mL IVPB (has no administration in time range)  cefTRIAXone (ROCEPHIN) 1 g in sodium chloride 0.9 % 100 mL IVPB (0 g Intravenous Stopped 06/15/18 0239)  sodium chloride 0.9 % bolus 1,000 mL (0 mLs Intravenous Stopped 06/15/18 0239)  acetaminophen (TYLENOL) tablet 650 mg (650 mg Oral Given 06/15/18 0055)    Mobility walks

## 2018-06-15 NOTE — Progress Notes (Signed)
Patient with left nephroureteral catheter placed by IR. Patient presented to ED  06/14/2018 with complaint of flank pain- was found to have pyelonephritis and admitted for management.  Patient awake and alert laying in bed. Accompanied by wife at bedside. Complains of left flank pain, stable at this time.  Ostomy site with surrounding erythema and pus in ostomy bag/around tube, approximately 100 mL urine in collection bag connected to ostomy. Ostomy bag was removed and site was cleaned. Drain flushes/aspirates without resistance. New ostomy bag placed.  Continue with Qshift flushes. Depending on output and infection, possible exchange of tube during hospital stay. IR to follow.  Bea Graff Rebecah Dangerfield, PA-C 06/15/2018, 12:15 PM

## 2018-06-15 NOTE — Progress Notes (Signed)
Patient was transferred to the unit at approximately 0507 accompanied by spouse. He is alert and verbally responsive and has voiced no complaints at this time. Urostomy bag attached to foley drainage bag. Wife at bedside.

## 2018-06-15 NOTE — Plan of Care (Signed)
  Problem: Clinical Measurements: Goal: Ability to maintain clinical measurements within normal limits will improve Outcome: Progressing   Problem: Education: Goal: Knowledge of General Education information will improve Description Including pain rating scale, medication(s)/side effects and non-pharmacologic comfort measures Outcome: Progressing   Problem: Clinical Measurements: Goal: Respiratory complications will improve Outcome: Progressing   Problem: Clinical Measurements: Goal: Cardiovascular complication will be avoided Outcome: Progressing   Problem: Activity: Goal: Risk for activity intolerance will decrease Outcome: Progressing   Problem: Coping: Goal: Level of anxiety will decrease Outcome: Progressing   Problem: Elimination: Goal: Will not experience complications related to bowel motility Outcome: Progressing

## 2018-06-15 NOTE — H&P (Addendum)
History and Physical    PAT SIRES OHY:[id number] DOB: 1951-05-24 DOA: 06/14/2018  Referring MD/NP/PA: Delora Fuel, MD PCP: Lorene Dy, MD  Patient coming from: Home  Chief Complaint: Left side pain  I have personally briefly reviewed patient's old medical records in [place]   HPI: Kristopher Thompson is a 67 y.o. male with medical history significant of prostate cancer s/p radical prostatectomy in 09/2012, bladder cancer s/p  cystoprostatectomy/ileal conduit rope with abdominal perineal resection with rectal tear req colostomy after rectal injury 08/08/17,  delayed urine leak, s/p urinoma drain placement on 10/04/17 by IR,  s/p left PCN 10/07/17, and nephrostogram with ureteral stent placement on 11/10/2017, and exchange of ureteral stent on 03/01/2018; who presents with complaints of left flank pain since yesterday.  Patient noted acute onset of sharp left-sided flank pain that was throbbing in nature.  Associated symptoms low-grade fevers with temperature measured up to 100 F at home, cloudy urine, and lower abdominal soreness.  Denies any chills, nausea, vomiting, diarrhea, shortness of breath, cough, or change in urine output.  Patient did not take any meds prior to arrival.    ED Course: Upon admission to the emergency department patient was seen to be afebrile, pulse 94-102, respirations 18-22, and all other vitals maintained.  Labs revealed WBC 18, potassium 5.2, BUN 20, creatinine 1.4, and lactic acid 1.44.  Urinalysis was positive for moderate leukocytes and nitrates with greater than 50 WBCs.  Renal CT revealed left kidney swelling with perinephric stranding and migration of stent.  Patient was empirically started on Rocephin and given 1 L of normal saline IV fluids.  Review of Systems  Constitutional: Positive for fever. Negative for malaise/fatigue.  HENT: Negative for ear discharge and nosebleeds.   Eyes: Negative for photophobia and pain.  Respiratory:  Negative for cough and shortness of breath.   Cardiovascular: Negative for chest pain and leg swelling.  Gastrointestinal: Positive for abdominal pain. Negative for nausea and vomiting.  Genitourinary: Positive for flank pain. Negative for dysuria.       Positive for cloudy urine  Musculoskeletal: Negative for falls and neck pain.  Skin: Negative for itching and rash.  Neurological: Negative for focal weakness and loss of consciousness.  Psychiatric/Behavioral: Negative for substance abuse and suicidal ideas.    Past Medical History:  Diagnosis Date  . At risk for sleep apnea    12-25-2017   STOP-BANG SCORE= 5   --- SENT TO PCP  . Bladder cancer [facility]) dx 07/2017   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  s/p  cystectomy w/ ileal conduit urinary diversion  . Colostomy in place [facility])    since 08-08-2017-- per pt 12-25-2017 reddness around stoma  . GERD (gastroesophageal reflux disease)   . H/O hiatal hernia   . History of sepsis 09/2017   dx bacteremia due to klebsiella pneumoniae,  post op intraabdominal abscess  . Prostate cancer [facility]) urologist-- dr [name]   10-02-2012 s/p  prostatectomy-- Stage T1c  . RBBB   . Renal disorder    pt. denies  . Wears glasses     Past Surgical History:  Procedure Laterality Date  . ABDOMINAL SURGERY    . APPENDECTOMY  1972  . CHOLECYSTECTOMY  1985  . COLOSTOMY REVERSAL N/A 01/08/2018   Procedure: COLOSTOMY REVERSAL;  Surgeon: Leighton Ruff, MD;  Location: [facility];  Service: General;  Laterality: N/A;  . CYSTOSCOPY WITH RETROGRADE PYELOGRAM, URETEROSCOPY AND STENT PLACEMENT Right 06/10/2017   Procedure: CYSTOSCOPY WITH RIGHT URETEROSCOPY  WITH RIGHT STENT PLACEMENT;  Surgeon: Raynelle Bring, MD;  Location: [facility];  Service: Urology;  Laterality: Right;  . EUS N/A 04/16/2018   Procedure: FULL UPPER ENDOSCOPIC ULTRASOUND (EUS) RADIAL;  Surgeon: Arta Silence, MD;  Location: [facility];  Service: Endoscopy;  Laterality: N/A;  . FLEXIBLE  SIGMOIDOSCOPY N/A 12/13/2017   Procedure: FLEXIBLE SIGMOIDOSCOPY;  Surgeon: Leighton Ruff, MD;  Location: [facility];  Service: Endoscopy;  Laterality: N/A;  . ILEO CONDUIT    . IR CATHETER TUBE CHANGE  12/13/2017  . IR CATHETER TUBE CHANGE  01/17/2018  . IR CATHETER TUBE CHANGE  02/28/2018  . IR CONVERT LEFT NEPHROSTOMY TO NEPHROURETERAL CATH  10/24/2017  . IR EXT NEPHROURETERAL CATH EXCHANGE  05/19/2018  . IR NEPHRO TUBE REMOV/FL  10/24/2017  . IR NEPHROSTOMY PLACEMENT LEFT  10/07/2017  . IR NEPHROSTOMY TUBE CHANGE  04/11/2018  . ROBOT ASSISTED LAPAROSCOPIC RADICAL PROSTATECTOMY  10/02/2012   Procedure: ROBOTIC ASSISTED LAPAROSCOPIC RADICAL PROSTATECTOMY LEVEL 2;  Surgeon: [place], MD;  Location: [facility];  Service: Urology;  Laterality: N/A;  . ROBOTIC ASSISTED LAPAROSCOPIC BLADDER DIVERTICULECTOMY N/A 08/08/2017   Procedure: XI ROBOTIC ASSISTED LAPAROSCOPIC RADICAL CYSTECTOMY COVERTED TO OPEN PELVIC LYMPHADNECTOMY BILATERAL AND ILEAL CONDUIT URINARY DIVERSION;  Surgeon: Raynelle Bring, MD;  Location: [facility];  Service: Urology;  Laterality: N/A;  . TRANSURETHRAL RESECTION OF BLADDER TUMOR  06/10/2017   Procedure: TRANSURETHRAL RESECTION OF BLADDER TUMOR (TURBT);  Surgeon: Raynelle Bring, MD;  Location: [facility];  Service: Urology;;  . XI ROBOT ABDOMINAL PERINEAL RESECTION N/A 08/08/2017   Procedure: REPAIR OF RECTAL TEAR POSSIBLE PARTIAL PROCTECTOMY, CREATION OF  OSTOMY;  Surgeon: Leighton Ruff, MD;  Location: [facility];  Service: General;  Laterality: N/A;     reports that he quit smoking about 38 years ago. His smoking use included cigarettes. He quit after 9.00 years of use. He has never used smokeless tobacco. He reports that he drinks alcohol. He reports that he does not use drugs.  Allergies  Allergen Reactions  . Demerol [Meperidine] Nausea And Vomiting and Other (See Comments)    SEVERE NAUSEA    Family History  Problem Relation Age of Onset  . Lung cancer Mother   . Hypertension Father    . Colon cancer Other   . CAD Neg Hx   . Diabetes Neg Hx   . Stroke Neg Hx     Prior to Admission medications   Medication Sig Start Date End Date Taking? Authorizing Provider  acetaminophen (TYLENOL) 500 MG tablet Take 1,000 mg by mouth every 8 (eight) hours as needed for mild pain or headache.     [provider]  bisacodyl (DULCOLAX) 5 MG EC tablet Take 10 mg by mouth Kristopher as needed for moderate constipation.    [provider]  Cholecalciferol (VITAMIN D PO) Take 1 capsule by mouth Kristopher.    [provider]  Cyanocobalamin (VITAMIN B-12 CR PO) Take 1 tablet by mouth Kristopher.    [provider]  D-MANNOSE PO Take 1 tablet by mouth Kristopher.    [provider]  esomeprazole (NEXIUM) 20 MG capsule Take 20 mg by mouth every 3 (three) days.     [provider]  ibuprofen (ADVIL,MOTRIN) 200 MG tablet Take 600 mg by mouth Kristopher as needed for headache or moderate pain.     [provider]  KRILL OIL PO Take 1 capsule by mouth Kristopher.    [provider]  Multiple Vitamin (MULTIVITAMIN WITH MINERALS) TABS  tablet Take 1 tablet by mouth Kristopher.    [provider]  senna (SENOKOT) 8.6 MG TABS tablet Take 1 tablet by mouth Kristopher as needed for mild constipation.    [provider]    Physical Exam:  Constitutional: Older male in NAD, calm, comfortable Vitals:   06/14/18 2247 06/15/18 0109 06/15/18 0115 06/15/18 0322  BP: 135/74 130/82 (!) 143/79 100/76  Pulse: 94 98 97 (!) 102  Resp: 18 18  (!) 22  Temp: 99.3 F (37.4 C)   98.6 F (37 C)  TempSrc: Oral   Oral  SpO2: 99% 97% 98% 97%   Eyes: PERRL, lids and conjunctivae normal ENMT: Mucous membranes are moist. Posterior pharynx clear of any exudate or lesions. Normal dentition.  Neck: normal, supple, no masses, no thyromegaly Respiratory: clear to auscultation bilaterally, no wheezing, no crackles. Normal respiratory effort. No accessory muscle use.    Cardiovascular: Regular rate and rhythm, no murmurs / rubs / gallops. No extremity edema. 2+ pedal pulses. No carotid bruits.  Abdomen: Left CVA tenderness noted with ileal conduit in place draining cloudy yellow urine. Musculoskeletal: no clubbing / cyanosis. No joint deformity upper and lower extremities. Good ROM, no contractures. Normal muscle tone.  Skin: no rashes, lesions, ulcers. No induration Neurologic: CN 2-12 grossly intact. Sensation intact, DTR normal. Strength 5/5 in all 4.  Psychiatric: Normal judgment and insight. Alert and oriented x 3. Normal mood.     Labs on Admission: I have personally reviewed following labs and imaging studies  CBC: Recent Labs  Lab 06/14/18 2310 06/14/18 2318  WBC 18.0* 17.0*  NEUTROABS  --  13.2*  HGB 16.0 15.8  HCT 48.6 48.4  MCV 77.6* 78.3  PLT 170 921   Basic Metabolic Panel: Recent Labs  Lab 06/14/18 2310  NA 141  K 5.2*  CL 107  CO2 23  GLUCOSE 122*  BUN 20  CREATININE 1.40*  CALCIUM 9.3   GFR: CrCl cannot be calculated (Unknown ideal weight.). Liver Function Tests: No results for input(s): AST, ALT, ALKPHOS, BILITOT, PROT, ALBUMIN in the last 168 hours. No results for input(s): LIPASE, AMYLASE in the last 168 hours. No results for input(s): AMMONIA in the last 168 hours. Coagulation Profile: No results for input(s): INR, PROTIME in the last 168 hours. Cardiac Enzymes: No results for input(s): CKTOTAL, CKMB, CKMBINDEX, TROPONINI in the last 168 hours. BNP (last 3 results) No results for input(s): PROBNP in the last 8760 hours. HbA1C: No results for input(s): HGBA1C in the last 72 hours. CBG: No results for input(s): GLUCAP in the last 168 hours. Lipid Profile: No results for input(s): CHOL, HDL, LDLCALC, TRIG, CHOLHDL, LDLDIRECT in the last 72 hours. Thyroid Function Tests: No results for input(s): TSH, T4TOTAL, FREET4, T3FREE, THYROIDAB in the last 72 hours. Anemia Panel: No results for input(s): VITAMINB12,  FOLATE, FERRITIN, TIBC, IRON, RETICCTPCT in the last 72 hours. Urine analysis:    Component Value Date/Time   COLORURINE AMBER (A) 06/14/2018 2310   APPEARANCEUR TURBID (A) 06/14/2018 2310   LABSPEC 1.015 06/14/2018 2310   PHURINE 7.0 06/14/2018 2310   GLUCOSEU NEGATIVE 06/14/2018 2310   HGBUR MODERATE (A) 06/14/2018 2310   BILIRUBINUR NEGATIVE 06/14/2018 2310   KETONESUR NEGATIVE 06/14/2018 2310   PROTEINUR 100 (A) 06/14/2018 2310   NITRITE POSITIVE (A) 06/14/2018 2310   LEUKOCYTESUR MODERATE (A) 06/14/2018 2310   Sepsis Labs: No results found for this or any previous visit (from the past 240 hour(s)).   Radiological Exams on  Admission: Ct Renal Stone Study  Result Date: 06/15/2018 CLINICAL DATA:  67 y/o M; left-sided abdominal pain with fever. Left nephroureteral stent placed 05/20/2018. History of bladder cancer. EXAM: CT ABDOMEN AND PELVIS WITHOUT CONTRAST TECHNIQUE: Multidetector CT imaging of the abdomen and pelvis was performed following the standard protocol without IV contrast. COMPARISON:  03/10/2012 CT abdomen and pelvis. FINDINGS: Lower chest: Stable 4 mm nodules at the lung bases. Small hiatal hernia. Hepatobiliary: No focal liver abnormality is seen. Status post cholecystectomy. No biliary dilatation. Pancreas: Stable 10 mm cyst within the pancreas body (series 2, image 26). No pancreatic ductal dilatation or surrounding inflammatory changes. Spleen: Normal in size without focal abnormality. Adrenals/Urinary Tract: The left-sided ureteral stent has migrated slightly downstream although still coiling within the lower pole of left kidney. There has been interval development of mild left pelvicaliectasis and moderate diffuse perinephric stranding as well as swelling of the left kidney. Normal right kidney and ureter. Right lower quadrant ileal conduit is stable. Stomach/Bowel: Stomach is within normal limits. Extensive sigmoid diverticulosis without findings of acute diverticulitis.  No evidence of bowel wall thickening, distention, or inflammatory changes. Vascular/Lymphatic: Aortic atherosclerosis. No enlarged abdominal or pelvic lymph nodes. Reproductive: Prostatectomy. Other: Stable midline ventral abdominal hernia containing fat and small bowel as well as large hernia adjacent to the right lower quadrant ileal conduit containing fat and small bowel. There are no obstructive changes of the bowel or associated inflammation. Musculoskeletal: Interval age-indeterminate L1 superior endplate fracture with mild central loss of vertebral body height. IMPRESSION: 1. Left kidney swelling, perinephric stranding, and pelvicaliectasis likely representing infection of the renal collecting system/pyelonephritis or stent dysfunction. The stent has migrated slightly downstream although still coiling within lower pole of left kidney. 2. Small hiatal hernia, ventral midline abdominal hernia, and hernia adjacent to the right lower quadrant ileal conduit. The ventral and right lower quadrant hernias contain fat and small bowel without obstruction or inflammation. 3. Stable pancreas body cystic lesion, annual follow-up recommended. 4. Aortic atherosclerosis. 5. Sigmoid diverticulosis without findings of acute diverticulitis. 6. Stable small nodules within the lung bases. Electronically Signed   By: Kristine Garbe M.D.   On: 06/15/2018 02:43    Renal CT: Independently reviewed.  Swelling of the left kidney with perinephric stranding noted  Assessment/Plan Pyelonephritis: Acute.  Patient presents with left flank pain and cloudy urine.  Urinalysis was positive for nitrates and leukocytes.  Patient did meet sepsis criteria, but lactic acid was reassuring at 1.44. - Admit to MedSurg bed - Follow-up urine cultures - Continue Rocephin - Hydrocodone as needed for pain  Leukocytosis: WBC elevated at 18.  Related likely to above. - Recheck CBC in a.m.   Acute kidney injury: Baseline creatinine  previously noted to be 0.8.  Patient presents with a creatinine of 1.4 with BUN 20.  CT showing migration of stent.  Question possible stent dysfunction. - Monitor intake and output - IV fluids as tolerated - Will need to discuss with interventional radiology in a.m.  Hyperkalemia: Acute.  Initial potassium mildly elevated at 5.2. - Continue IV fluids as tolerated - Check potassium in a.m.  History of bladder cancer s/p cystectomy and ileal conduit: Status post cyst prostatectomy/ileal conduit rope with abdominal perineal resection. Had a rectal tear status post colostomy after rectal injury on 08/08/2017, status post urinoma drain placement on 10/04/2017 by IR status post left PCN on 10/07/2017.  Patient status post exchange of the urostomy tube last exchanged by interventional radiology and 02/2018. - Follow-up  with IR as seen above  History of prostate cancer: Patient had prostate cancer and underwent radical prostatectomy in 10/02/2012.  Patient is followed by Dr. [name] of urology in outpatient setting noted to have negative PSA screening. - Continue outpatient follow-up      DVT prophylaxis: Lovenox Code Status: full Family Communication: Discussed plan of care with patient family present at bedside Disposition Plan: Likely discharge home once medically stable Consults called: None Admission status: Inpatient  Kristopher Morton MD [facility] Pager [phone]   If 7PM-7AM, please contact night-coverage [email or web address] Password [email or web address]  06/15/2018, 3:30 AM

## 2018-06-15 NOTE — ED Provider Notes (Signed)
[facility] DEPT Provider Note   CSN: [id number] Arrival date & time: 06/14/18  2229     History   Chief Complaint Chief Complaint  Patient presents with  . Flank Pain    HPI LANKFORD GUTZMER is a 67 y.o. male.  The history is provided by the patient.  He has a history of bladder cancer with ileal conduit and comes in complaining of left flank pain and pain in the left lower quadrant and suprapubic area which started today.  Pain is mild to moderate and he rates it at 4/10.  Nothing makes it better, nothing makes it worse.  He did take a dose of ibuprofen with partial relief.  There is mild nausea but no vomiting.  He denies fever or sweats but has had chills.  Maximum temperature at home was 100.0.  There is been no constipation or diarrhea.  He has noted that his urine is cloudy, and this is a change for him.  Past Medical History:  Diagnosis Date  . At risk for sleep apnea    12-25-2017   STOP-BANG SCORE= 5   --- SENT TO PCP  . Bladder cancer [facility]) dx 07/2017   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  s/p  cystectomy w/ ileal conduit urinary diversion  . Colostomy in place [facility])    since 08-08-2017-- per pt 12-25-2017 reddness around stoma  . GERD (gastroesophageal reflux disease)   . H/O hiatal hernia   . History of sepsis 09/2017   dx bacteremia due to klebsiella pneumoniae,  post op intraabdominal abscess  . Prostate cancer [facility]) urologist-- dr [name]   10-02-2012 s/p  prostatectomy-- Stage T1c  . RBBB   . Renal disorder    pt. denies  . Wears glasses     Patient Active Problem List   Diagnosis Date Noted  . 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 Candida infection   . Postoperative intra-abdominal abscess   . Abscess   . Fever 10/09/2017  . Bacteremia due to Klebsiella pneumoniae 10/09/2017  . Urine leakage from surgical incision   . Sepsis ([place]) 10/04/2017  . GERD (gastroesophageal reflux disease) 08/11/2017  . Obesity  (BMI 30-39.9) 08/11/2017  . 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 08/08/2017 08/08/2017    Past Surgical History:  Procedure Laterality Date  . ABDOMINAL SURGERY    . APPENDECTOMY  1972  . CHOLECYSTECTOMY  1985  . COLOSTOMY REVERSAL N/A 01/08/2018   Procedure: COLOSTOMY REVERSAL;  Surgeon: Leighton Ruff, MD;  Location: [facility] ORS;  Service: General;  Laterality: N/A;  . CYSTOSCOPY WITH RETROGRADE PYELOGRAM, URETEROSCOPY AND STENT PLACEMENT Right 06/10/2017   Procedure: CYSTOSCOPY WITH RIGHT URETEROSCOPY WITH RIGHT STENT PLACEMENT;  Surgeon: Raynelle Bring, MD;  Location: [facility];  Service: Urology;  Laterality: Right;  . EUS N/A 04/16/2018   Procedure: FULL UPPER ENDOSCOPIC ULTRASOUND (EUS) RADIAL;  Surgeon: Arta Silence, MD;  Location: [facility];  Service: Endoscopy;  Laterality: N/A;  . FLEXIBLE SIGMOIDOSCOPY N/A 12/13/2017   Procedure: FLEXIBLE SIGMOIDOSCOPY;  Surgeon: Leighton Ruff, MD;  Location: [facility];  Service: Endoscopy;  Laterality: N/A;  . ILEO CONDUIT    . IR CATHETER TUBE CHANGE  12/13/2017  . IR CATHETER TUBE CHANGE  01/17/2018  . IR CATHETER TUBE CHANGE  02/28/2018  . IR CONVERT LEFT NEPHROSTOMY TO NEPHROURETERAL CATH  10/24/2017  . IR EXT NEPHROURETERAL CATH EXCHANGE  05/19/2018  . IR NEPHRO TUBE REMOV/FL  10/24/2017  . IR NEPHROSTOMY PLACEMENT LEFT  10/07/2017  . IR NEPHROSTOMY TUBE CHANGE  04/11/2018  . ROBOT ASSISTED LAPAROSCOPIC RADICAL PROSTATECTOMY  10/02/2012   Procedure: ROBOTIC ASSISTED LAPAROSCOPIC RADICAL PROSTATECTOMY LEVEL 2;  Surgeon: Dutch Gray, MD;  Location: [facility];  Service: Urology;  Laterality: N/A;  . ROBOTIC ASSISTED LAPAROSCOPIC BLADDER DIVERTICULECTOMY N/A 08/08/2017   Procedure: XI ROBOTIC ASSISTED LAPAROSCOPIC RADICAL CYSTECTOMY COVERTED TO OPEN PELVIC LYMPHADNECTOMY BILATERAL AND ILEAL CONDUIT URINARY DIVERSION;  Surgeon: Raynelle Bring, MD;  Location: [facility];  Service: Urology;  Laterality: N/A;  . TRANSURETHRAL RESECTION OF BLADDER TUMOR  06/10/2017   Procedure: TRANSURETHRAL RESECTION OF BLADDER TUMOR (TURBT);  Surgeon: Raynelle Bring, MD;  Location: [facility];  Service: Urology;;  . XI ROBOT ABDOMINAL PERINEAL RESECTION N/A 08/08/2017   Procedure: REPAIR OF RECTAL TEAR POSSIBLE PARTIAL PROCTECTOMY, CREATION OF  OSTOMY;  Surgeon: Leighton Ruff, MD;  Location: [facility];  Service: General;  Laterality: N/A;        Home Medications    Prior to Admission medications   Medication Sig Start Date End Date Taking? Authorizing Provider  acetaminophen (TYLENOL) 500 MG tablet Take 1,000 mg by mouth every 8 (eight) hours as needed for mild pain or headache.     [provider]  bisacodyl (DULCOLAX) 5 MG EC tablet Take 10 mg by mouth daily as needed for moderate constipation.    [provider]  Cholecalciferol (VITAMIN D PO) Take 1 capsule by mouth daily.    [provider]  Cyanocobalamin (VITAMIN B-12 CR PO) Take 1 tablet by mouth daily.    [provider]  D-MANNOSE PO Take 1 tablet by mouth daily.    [provider]  esomeprazole (NEXIUM) 20 MG capsule Take 20 mg by mouth every 3 (three) days.     [provider]  ibuprofen (ADVIL,MOTRIN) 200 MG tablet Take 600 mg by mouth daily as needed for headache or moderate pain.     [provider]  KRILL OIL PO Take 1 capsule by mouth daily.    [provider]  Multiple Vitamin (MULTIVITAMIN WITH MINERALS) TABS tablet Take 1 tablet by mouth daily.    [provider]  senna (SENOKOT) 8.6 MG TABS tablet Take 1 tablet by mouth daily as needed for mild constipation.    [provider]    Family History Family History  Problem Relation Age of Onset  . Lung cancer Mother   . Hypertension Father   . Colon cancer Other   . CAD Neg Hx   . Diabetes Neg Hx   . Stroke Neg Hx     Social  History Social History   Tobacco Use  . Smoking status: Former Smoker    Years: 9.00    Types: Cigarettes    Last attempt to quit: 12/25/1979    Years since quitting: 38.4  . Smokeless tobacco: Never Used  Substance Use Topics  . Alcohol use: Yes    Comment: occasional  . Drug use: No     Allergies   Demerol [meperidine]   Review of Systems Review of Systems  All other systems reviewed and are negative.    Physical Exam Updated Vital Signs BP 135/74   Pulse 94   Temp 99.3 F (37.4 C) (Oral)   Resp 18   SpO2 99%   Physical Exam  Nursing note and vitals reviewed.  67 year old male, resting comfortably and in  no acute distress. Vital signs are normal. Oxygen saturation is 99%, which is normal. Head is normocephalic and atraumatic. PERRLA, EOMI. Oropharynx is clear. Neck is nontender and supple without adenopathy or JVD. Back is nontender in the midline.  There is mild left CVA tenderness. Lungs are clear without rales, wheezes, or rhonchi. Chest is nontender. Heart has regular rate and rhythm without murmur. Abdomen is soft, flat, with mild left lower quadrant and suprapubic tenderness.  There is no rebound or guarding.  There are no masses or hepatosplenomegaly and peristalsis is normoactive.  Ileal conduit is present in the right lower quadrant. Extremities have no cyanosis or edema, full range of motion is present. Skin is warm and dry without rash. Neurologic: Mental status is normal, cranial nerves are intact, there are no motor or sensory deficits.  ED Treatments / Results  Labs (all labs ordered are listed, but only abnormal results are displayed) Labs Reviewed  URINALYSIS, ROUTINE W REFLEX MICROSCOPIC - Abnormal; Notable for the following components:      Result Value   Color, Urine AMBER (*)    APPearance TURBID (*)    Hgb urine dipstick MODERATE (*)    Protein, ur 100 (*)    Nitrite POSITIVE (*)    Leukocytes, UA MODERATE (*)    WBC, UA >50 (*)     Bacteria, UA FEW (*)    All other components within normal limits  BASIC METABOLIC PANEL - Abnormal; Notable for the following components:   Potassium 5.2 (*)    Glucose, Bld 122 (*)    Creatinine, Ser 1.40 (*)    GFR calc non Af Amer 51 (*)    GFR calc Af Amer 59 (*)    All other components within normal limits  CBC - Abnormal; Notable for the following components:   WBC 18.0 (*)    RBC 6.26 (*)    MCV 77.6 (*)    MCH 25.6 (*)    RDW 16.0 (*)    All other components within normal limits  CBC WITH DIFFERENTIAL/PLATELET - Abnormal; Notable for the following components:   WBC 17.0 (*)    RBC 6.18 (*)    MCH 25.6 (*)    RDW 16.2 (*)    Neutro Abs 13.2 (*)    Monocytes Absolute 2.0 (*)    All other components within normal limits  URINE CULTURE  I-STAT CG4 LACTIC ACID, ED   Radiology Ct Renal Stone Study  Result Date: 06/15/2018 CLINICAL DATA:  67 y/o M; left-sided abdominal pain with fever. Left nephroureteral stent placed 05/20/2018. History of bladder cancer. EXAM: CT ABDOMEN AND PELVIS WITHOUT CONTRAST TECHNIQUE: Multidetector CT imaging of the abdomen and pelvis was performed following the standard protocol without IV contrast. COMPARISON:  03/10/2012 CT abdomen and pelvis. FINDINGS: Lower chest: Stable 4 mm nodules at the lung bases. Small hiatal hernia. Hepatobiliary: No focal liver abnormality is seen. Status post cholecystectomy. No biliary dilatation. Pancreas: Stable 10 mm cyst within the pancreas body (series 2, image 26). No pancreatic ductal dilatation or surrounding inflammatory changes. Spleen: Normal in size without focal abnormality. Adrenals/Urinary Tract: The left-sided ureteral stent has migrated slightly downstream although still coiling within the lower pole of left kidney. There has been interval development of mild left pelvicaliectasis and moderate diffuse perinephric stranding as well as swelling of the left kidney. Normal right kidney and ureter. Right lower  quadrant ileal conduit is stable. Stomach/Bowel: Stomach is within normal limits. Extensive sigmoid diverticulosis without findings of  acute diverticulitis. No evidence of bowel wall thickening, distention, or inflammatory changes. Vascular/Lymphatic: Aortic atherosclerosis. No enlarged abdominal or pelvic lymph nodes. Reproductive: Prostatectomy. Other: Stable midline ventral abdominal hernia containing fat and small bowel as well as large hernia adjacent to the right lower quadrant ileal conduit containing fat and small bowel. There are no obstructive changes of the bowel or associated inflammation. Musculoskeletal: Interval age-indeterminate L1 superior endplate fracture with mild central loss of vertebral body height. IMPRESSION: 1. Left kidney swelling, perinephric stranding, and pelvicaliectasis likely representing infection of the renal collecting system/pyelonephritis or stent dysfunction. The stent has migrated slightly downstream although still coiling within lower pole of left kidney. 2. Small hiatal hernia, ventral midline abdominal hernia, and hernia adjacent to the right lower quadrant ileal conduit. The ventral and right lower quadrant hernias contain fat and small bowel without obstruction or inflammation. 3. Stable pancreas body cystic lesion, annual follow-up recommended. 4. Aortic atherosclerosis. 5. Sigmoid diverticulosis without findings of acute diverticulitis. 6. Stable small nodules within the lung bases. Electronically Signed   By: Kristine Garbe M.D.   On: 06/15/2018 02:43    Procedures Procedures   Medications Ordered in ED Medications  cefTRIAXone (ROCEPHIN) 1 g in sodium chloride 0.9 % 100 mL IVPB (0 g Intravenous Stopped 06/15/18 0239)  sodium chloride 0.9 % bolus 1,000 mL (0 mLs Intravenous Stopped 06/15/18 0239)  acetaminophen (TYLENOL) tablet 650 mg (650 mg Oral Given 06/15/18 0055)     Initial Impression / Assessment and Plan / ED Course  I have reviewed the  triage vital signs and the nursing notes.  Pertinent labs & imaging results that were available during my care of the patient were reviewed by me and considered in my medical decision making (see chart for details).  Left flank pain in setting of chills and cloudy urine worrisome for early pyelonephritis.  Old records are reviewed, and prior CT scans have not shown any evidence of nephrolithiasis, as I do not feel that this is renal colic.  Labs today show significant leukocytosis with WBC 18.0.  There is also been a significant increase in his creatinine from 0.87 on July 10, to 1.40 today.  Urinalysis has positive nitrite and WBC clumps which are new findings he will be sent for renal stone protocol CT scan looking for signs of urinary obstruction.  He urine has been sent for culture and he is given a dose of ceftriaxone.  Of note, he is nontoxic in appearance and is afebrile with normal blood pressure and heart rate, so I do not feel that he is septic.  However, will send lactic acid level.  Lactic acid level is normal.  CT shows changes suggestive of pyelonephritis involving left kidney and mild distention of the left renal pelvis.  Case is discussed with Dr. Tamala Julian of [facility], who agrees to admit the patient.  Final Clinical Impressions(s) / ED Diagnoses   Final diagnoses:  Pyelonephritis, acute  Acute kidney injury (nontraumatic) [facility])    ED Discharge Orders    None       Delora Fuel, MD 11/13/70 [phone]

## 2018-06-16 ENCOUNTER — Inpatient Hospital Stay (HOSPITAL_COMMUNITY): Payer: Medicare Other

## 2018-06-16 ENCOUNTER — Encounter (HOSPITAL_COMMUNITY): Payer: Self-pay | Admitting: Interventional Radiology

## 2018-06-16 HISTORY — PX: IR EXT NEPHROURETERAL CATH EXCHANGE: IMG5418

## 2018-06-16 LAB — BASIC METABOLIC PANEL
ANION GAP: 6 (ref 5–15)
BUN: 16 mg/dL (ref 8–23)
CALCIUM: 8.5 mg/dL — AB (ref 8.9–10.3)
CHLORIDE: 111 mmol/L (ref 98–111)
CO2: 24 mmol/L (ref 22–32)
Creatinine, Ser: 1.01 mg/dL (ref 0.61–1.24)
GFR calc non Af Amer: >60 mL/min (ref >60)
Glucose, Bld: 100 mg/dL — ABNORMAL HIGH (ref 70–99)
Potassium: 4.3 mmol/L (ref 3.5–5.1)
SODIUM: 141 mmol/L (ref 135–145)

## 2018-06-16 LAB — CBC
HCT: 40.7 % (ref 39.0–52.0)
HEMOGLOBIN: 12.9 g/dL — AB (ref 13.0–17.0)
MCH: 24.7 pg — ABNORMAL LOW (ref 26.0–34.0)
MCHC: 31.7 g/dL (ref 30.0–36.0)
MCV: 77.8 fL — ABNORMAL LOW (ref 78.0–100.0)
PLATELETS: 147 10*3/uL — AB (ref 150–400)
RBC: 5.23 MIL/uL (ref 4.22–5.81)
RDW: 16 % — ABNORMAL HIGH (ref 11.5–15.5)
WBC: 9.6 10*3/uL (ref 4.0–10.5)

## 2018-06-16 LAB — URINE CULTURE

## 2018-06-16 MED ORDER — BISACODYL 5 MG PO TBEC
10.0000 mg | DELAYED_RELEASE_TABLET | Freq: Two times a day (BID) | ORAL | Status: DC
  Administered 2018-06-16 (×2): 10 mg via ORAL
  Filled 2018-06-16 (×3): qty 2

## 2018-06-16 MED ORDER — IOPAMIDOL (ISOVUE-300) INJECTION 61%
INTRAVENOUS | Status: AC
  Administered 2018-06-16: 10 mL
  Filled 2018-06-16: qty 50

## 2018-06-16 MED ORDER — BISACODYL 5 MG PO TBEC: 10.0000 mg | DELAYED_RELEASE_TABLET | Freq: Every day | ORAL | Status: DC | PRN

## 2018-06-16 MED ORDER — IOPAMIDOL (ISOVUE-300) INJECTION 61%
50.0000 mL | Freq: Once | INTRAVENOUS | Status: AC | PRN
  Administered 2018-06-16: 10 mL

## 2018-06-16 MED ORDER — SENNA 8.6 MG PO TABS
1.0000 | ORAL_TABLET | Freq: Every day | ORAL | Status: DC
  Administered 2018-06-16: 8.6 mg via ORAL
  Filled 2018-06-16 (×2): qty 1

## 2018-06-16 NOTE — Procedures (Signed)
Pre Procedure Dx: Hydronephrosis Post Procedure Dx: Same  Successful left sided 12 Fr retrograde Nephroureteral catheter exchange.    EBL: None   No immediate complications.   Ronny Bacon, MD Pager #: [phone]

## 2018-06-16 NOTE — Progress Notes (Addendum)
PROGRESS NOTE    Kristopher Thompson  EXN:[id number] DOB: [birth year] DOA: 06/14/2018 PCP: Kristopher Dy, MD     Brief Narrative:  Kristopher Thompson a 67 y.o.malewith medical history significant ofprostate cancers/p radical prostatectomyin 09/2012,bladder cancer s/pcystoprostatectomy/ileal conduitrope with abdominal perineal resection with rectal tearreqcolostomy after rectal injury 08/08/17,delayed urine leak, s/p urinoma drain placement on 11/23/18by IR,s/p left PCN 10/07/17,andnephrostogram withureteral stent placementon 11/10/2017,and exchange of ureteral stent on 03/01/2018, 04/11/2018; who presents with complaints of left flank pain since yesterday. Patient noted acute onset of sharp, throbbing left-sided flank pain. Associated symptoms low-grade fevers with temperature measured up to 100 F at home, cloudy urine, and lower abdominal soreness. CT revealed left kidney swelling, perinephric stranding, and pelvicaliectasis likely representing infection of the renal collecting system/pyelonephritis or stent dysfunction. The stent has migrated slightly downstream although still coiling within lower pole of left kidney. He was admitted for pyelonephritis and started on IV antibiotics.   New events last 24 hours / Subjective: No acute events overnight, feeling better overall. Fever yesterday 100.5  Assessment & Plan:   Principal Problem:   Pyelonephritis Active Problems:   S/P ileal conduit ([facility])   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 08/08/2017   GERD (gastroesophageal reflux disease)   Leukocytosis   Hyperkalemia   AKI (acute kidney injury) ([place])   Acute left side pyelonephritis -Urine culture with contamination -IR consulted due to CT finding of stent migration -Continue IV rocephin -WBC improving     AKI Resolved with IVF Cr normal 1.01  History of bladder cancer s/p cystectomy and ileal conduit, s/p cyst prostatectomy/ileal conduit rope with  abdominal perineal resection, s/p exchange nephrourostomy tube   -Follows with Dr. [name] (urology) and IR     DVT prophylaxis: Lovenox Code Status: Full Family Communication: Wife at bedside Disposition Plan: Pending improvement, suspect return home on discharge   Consultants:   IR  Procedures:   None   Antimicrobials:  Anti-infectives (From admission, onward)   Start     Dose/Rate Route Frequency Ordered Stop   06/15/18 2200  cefTRIAXone (ROCEPHIN) 1 g in sodium chloride 0.9 % 100 mL IVPB     1 g 200 mL/hr over 30 Minutes Intravenous Every 24 hours 06/15/18 0404     06/15/18 0045  cefTRIAXone (ROCEPHIN) 1 g in sodium chloride 0.9 % 100 mL IVPB     1 g 200 mL/hr over 30 Minutes Intravenous  Once 06/15/18 0031 06/15/18 0239        Objective: Vitals:   06/15/18 0644 06/15/18 1334 06/15/18 2015 06/16/18 0533  BP:  (!) 142/81 137/71 134/81  Pulse:  89 82 70  Resp:  18 16 17   Temp:  (!) 100.5 F (38.1 C) 99.2 F (37.3 C) 98.5 F (36.9 C)  TempSrc:  Oral Oral Oral  SpO2:  100% 99% 99%  Weight: 115.5 kg (254 lb 10.1 oz)     Height: 6' (1.829 m)       Intake/Output Summary (Last 24 hours) at 06/16/2018 1143 Last data filed at 06/16/2018 1000 Gross per 24 hour  Intake 3655.07 ml  Output 4400 ml  Net -744.93 ml   Filed Weights   06/15/18 0644  Weight: 115.5 kg (254 lb 10.1 oz)    Examination:  General exam: Appears calm and comfortable  Respiratory system: Clear to auscultation. Respiratory effort normal. Cardiovascular system: S1 & S2 heard, RRR. No JVD, murmurs, rubs, gallops or clicks. No pedal edema. Gastrointestinal system: Abdomen is nondistended, soft and nontender.  No organomegaly or masses felt. Normal bowel sounds heard. Central nervous system: Alert and oriented. No focal neurological deficits. Extremities: Symmetric 5 x 5 power. Skin: No rashes, lesions or ulcers Psychiatry: Judgement and insight appear normal. Mood & affect appropriate.   Data  Reviewed: I have personally reviewed following labs and imaging studies  CBC: Recent Labs  Lab 06/14/18 2310 06/14/18 2318 06/15/18 0600 06/16/18 0425  WBC 18.0* 17.0* 14.1* 9.6  NEUTROABS  --  13.2*  --   --   HGB 16.0 15.8 13.7 12.9*  HCT 48.6 48.4 42.3 40.7  MCV 77.6* 78.3 77.9* 77.8*  PLT 170 167 164 161*   Basic Metabolic Panel: Recent Labs  Lab 06/14/18 2310 06/15/18 0600 06/16/18 0425  NA 141 141 141  K 5.2* 3.9 4.3  CL 107 109 111  CO2 23 24 24   GLUCOSE 122* 108* 100*  BUN 20 21 16   CREATININE 1.40* 1.30* 1.01  CALCIUM 9.3 8.7* 8.5*  PHOS  --  3.9  --    GFR: Estimated Creatinine Clearance: 94.4 mL/min (by C-G formula based on SCr of 1.01 mg/dL). Liver Function Tests: No results for input(s): AST, ALT, ALKPHOS, BILITOT, PROT, ALBUMIN in the last 168 hours. No results for input(s): LIPASE, AMYLASE in the last 168 hours. No results for input(s): AMMONIA in the last 168 hours. Coagulation Profile: No results for input(s): INR, PROTIME in the last 168 hours. Cardiac Enzymes: No results for input(s): CKTOTAL, CKMB, CKMBINDEX, TROPONINI in the last 168 hours. BNP (last 3 results) No results for input(s): PROBNP in the last 8760 hours. HbA1C: No results for input(s): HGBA1C in the last 72 hours. CBG: No results for input(s): GLUCAP in the last 168 hours. Lipid Profile: No results for input(s): CHOL, HDL, LDLCALC, TRIG, CHOLHDL, LDLDIRECT in the last 72 hours. Thyroid Function Tests: No results for input(s): TSH, T4TOTAL, FREET4, T3FREE, THYROIDAB in the last 72 hours. Anemia Panel: No results for input(s): VITAMINB12, FOLATE, FERRITIN, TIBC, IRON, RETICCTPCT in the last 72 hours. Sepsis Labs: Recent Labs  Lab 06/15/18 0107  LATICACIDVEN 1.44    Recent Results (from the past 240 hour(s))  Urine culture     Status: Abnormal   Collection Time: 06/15/18 12:17 AM  Result Value Ref Range Status   Specimen Description   Final    URINE, RANDOM Performed at  [facility] [address] [place], [place] [address]    Special Requests   Final    NONE Performed at [facility], [place] [address], [place], [place] [address]    Culture MULTIPLE SPECIES PRESENT, SUGGEST RECOLLECTION (A)  Final   Report Status 06/16/2018 FINAL  Final       Radiology Studies: Ct Renal Stone Study  Result Date: 06/15/2018 CLINICAL DATA:  67 y/o M; left-sided abdominal pain with fever. Left nephroureteral stent placed 05/20/2018. History of bladder cancer. EXAM: CT ABDOMEN AND PELVIS WITHOUT CONTRAST TECHNIQUE: Multidetector CT imaging of the abdomen and pelvis was performed following the standard protocol without IV contrast. COMPARISON:  03/10/2012 CT abdomen and pelvis. FINDINGS: Lower chest: Stable 4 mm nodules at the lung bases. Small hiatal hernia. Hepatobiliary: No focal liver abnormality is seen. Status post cholecystectomy. No biliary dilatation. Pancreas: Stable 10 mm cyst within the pancreas body (series 2, image 26). No pancreatic ductal dilatation or surrounding inflammatory changes. Spleen: Normal in size without focal abnormality. Adrenals/Urinary Tract: The left-sided ureteral stent has migrated slightly downstream although still coiling within the lower pole  of left kidney. There has been interval development of mild left pelvicaliectasis and moderate diffuse perinephric stranding as well as swelling of the left kidney. Normal right kidney and ureter. Right lower quadrant ileal conduit is stable. Stomach/Bowel: Stomach is within normal limits. Extensive sigmoid diverticulosis without findings of acute diverticulitis. No evidence of bowel wall thickening, distention, or inflammatory changes. Vascular/Lymphatic: Aortic atherosclerosis. No enlarged abdominal or pelvic lymph nodes. Reproductive: Prostatectomy. Other: Stable midline ventral abdominal hernia containing fat and small bowel as well as large hernia adjacent to the right  lower quadrant ileal conduit containing fat and small bowel. There are no obstructive changes of the bowel or associated inflammation. Musculoskeletal: Interval age-indeterminate L1 superior endplate fracture with mild central loss of vertebral body height. IMPRESSION: 1. Left kidney swelling, perinephric stranding, and pelvicaliectasis likely representing infection of the renal collecting system/pyelonephritis or stent dysfunction. The stent has migrated slightly downstream although still coiling within lower pole of left kidney. 2. Small hiatal hernia, ventral midline abdominal hernia, and hernia adjacent to the right lower quadrant ileal conduit. The ventral and right lower quadrant hernias contain fat and small bowel without obstruction or inflammation. 3. Stable pancreas body cystic lesion, annual follow-up recommended. 4. Aortic atherosclerosis. 5. Sigmoid diverticulosis without findings of acute diverticulitis. 6. Stable small nodules within the lung bases. Electronically Signed   By: Kristine Garbe M.D.   On: 06/15/2018 02:43      Scheduled Meds: . bisacodyl  10 mg Oral BID  . enoxaparin (LOVENOX) injection  40 mg Subcutaneous Daily  . senna  1-2 tablet Oral Daily   Continuous Infusions: . cefTRIAXone (ROCEPHIN) IVPB 1 gram/100 mL NS (Mini-Bag Plus) Stopped (06/15/18 2223)     LOS: 1 day    Time spent: 25 minutes   Dessa Phi, DO [facility] [email or web address] Password [email or web address] 06/16/2018, 11:43 AM

## 2018-06-16 NOTE — Procedures (Signed)
Pre Procedure Dx: Ureteral obstruction Post Procedure Dx: Same  Successful left sided 12 Fr Nephroureteral catheter exchange.  EBL: None   No immediate complications.   Ronny Bacon, MD Pager #: [phone]

## 2018-06-17 LAB — BASIC METABOLIC PANEL
ANION GAP: 10 (ref 5–15)
BUN: 20 mg/dL (ref 8–23)
CALCIUM: 8.7 mg/dL — AB (ref 8.9–10.3)
CHLORIDE: 109 mmol/L (ref 98–111)
CO2: 22 mmol/L (ref 22–32)
Creatinine, Ser: 0.85 mg/dL (ref 0.61–1.24)
GFR calc non Af Amer: >60 mL/min (ref >60)
GLUCOSE: 109 mg/dL — AB (ref 70–99)
POTASSIUM: 3.7 mmol/L (ref 3.5–5.1)
Sodium: 141 mmol/L (ref 135–145)

## 2018-06-17 LAB — CBC
HEMATOCRIT: 41.3 % (ref 39.0–52.0)
HEMOGLOBIN: 13.4 g/dL (ref 13.0–17.0)
MCH: 25 pg — AB (ref 26.0–34.0)
MCHC: 32.4 g/dL (ref 30.0–36.0)
MCV: 76.9 fL — AB (ref 78.0–100.0)
Platelets: 153 10*3/uL (ref 150–400)
RBC: 5.37 MIL/uL (ref 4.22–5.81)
RDW: 15.8 % — ABNORMAL HIGH (ref 11.5–15.5)
WBC: 8.9 10*3/uL (ref 4.0–10.5)

## 2018-06-17 MED ORDER — CIPROFLOXACIN HCL 500 MG PO TABS: 500.0000 mg | ORAL_TABLET | Freq: Two times a day (BID) | ORAL | 0 refills | Status: AC

## 2018-06-17 NOTE — Discharge Summary (Signed)
Physician Discharge Summary  Kristopher Thompson ZTI:[id number] DOB: 22-Jan-1951 DOA: 06/14/2018  PCP: Lorene Dy, MD  Admit date: 06/14/2018 Discharge date: 06/17/2018  Admitted From: Home Disposition:  Home  Recommendations for Outpatient Follow-up:  1. Follow up with PCP in 1 week 2. Follow up with Dr. [name], Urology, as scheduled  Discharge Condition: Stable CODE STATUS: Full  Diet recommendation: Heart healthy   Brief/Interim Summary: Kristopher Thompson a 67 y.o.malewith medical history significant ofprostate cancers/p radical prostatectomyin 09/2012,bladder cancer s/pcystoprostatectomy/ileal conduitrope with abdominal perineal resection with rectal tearreqcolostomy after rectal injury 08/08/17,delayed urine leak, s/p urinoma drain placement on 11/23/18by IR,s/p left PCN 10/07/17,andnephrostogram withureteral stent placementon 11/10/2017,and exchange of ureteral stent on 03/01/2018, 04/11/2018; who presents with complaints of left flank pain since yesterday. Patient noted acute onset of sharp, throbbingleft-sided flank pain. Associated symptoms low-grade fevers with temperature measured up to 100 F at home, cloudy urine, and lower abdominal soreness.CT revealed left kidney swelling, perinephric stranding, and pelvicaliectasis likely representing infection of the renal collecting system/pyelonephritis or stent dysfunction. The stent has migrated slightly downstream although still coiling within lower pole of left kidney.He was admitted for pyelonephritis and started on IV antibiotics. He was evaluated by IR and underwent successful left sided 12 Fr Nephroureteral catheter exchange on 8/5. He remained afebrile, clinically improved. Urine culture showed contamination. His previous urine culture was positive for Klebsiella pneumonia and he was transitioned to Cipro to treat pyelonephritis.   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/08/2017   GERD (gastroesophageal reflux disease)   Leukocytosis   Hyperkalemia   AKI (acute kidney injury) ([place])   Acute left side pyelonephritis -Urine culture with contamination, previous urine culture positive for Klebsiella 05/21/18  -IR consulted due to CT finding of stent migration, s/p left sided 12 Fr Nephroureteral catheter exchange on 8/5 -IV rocephin --> Cipro on discharge  -WBC normal, afebrile   AKI -Resolved with IVF -Cr normal   History of bladder cancers/pcystectomy and ileal conduit, s/p cyst prostatectomy/ileal conduit rope with abdominal perineal resection, s/p exchange nephrourostomy tube   -Follows with Dr. [name] (urology) and IR     Discharge Instructions  Discharge Instructions    Call MD for:  difficulty breathing, headache or visual disturbances   Complete by:  As directed    Call MD for:  extreme fatigue   Complete by:  As directed    Call MD for:  hives   Complete by:  As directed    Call MD for:  persistant dizziness or light-headedness   Complete by:  As directed    Call MD for:  persistant nausea and vomiting   Complete by:  As directed    Call MD for:  severe uncontrolled pain   Complete by:  As directed    Call MD for:  temperature >100.4   Complete by:  As directed    Diet - low sodium heart healthy   Complete by:  As directed    Discharge instructions   Complete by:  As directed    You were cared for by a hospitalist during your hospital stay. If you have any questions about your discharge medications or the care you received while you were in the hospital after you are discharged, you can call the unit and ask to speak with the hospitalist on call if the hospitalist that took care of you is not available.  Once you are discharged, your primary care physician will handle any further medical issues. Please note that NO REFILLS for any discharge medications will be  authorized once you are discharged, as it is imperative that you return to your primary care physician (or establish a relationship with a primary care physician if you do not have one) for your aftercare needs so that they can reassess your need for medications and monitor your lab values.   Increase activity slowly   Complete by:  As directed      Allergies as of 06/17/2018      Reactions   Demerol [meperidine] Nausea And Vomiting, Other (See Comments)   SEVERE NAUSEA      Medication List    TAKE these medications   acetaminophen 500 MG tablet Commonly known as:  TYLENOL Take 1,000 mg by mouth every 8 (eight) hours as needed for mild pain or headache.   bisacodyl 5 MG EC tablet Commonly known as:  DULCOLAX Take 10 mg by mouth daily as needed for moderate constipation.   ciprofloxacin 500 MG tablet Commonly known as:  CIPRO Take 1 tablet (500 mg total) by mouth 2 (two) times daily for 7 days.   D-MANNOSE PO Take 1 tablet by mouth daily.   esomeprazole 20 MG capsule Commonly known as:  NEXIUM Take 20 mg by mouth every 3 (three) days.   ibuprofen 200 MG tablet Commonly known as:  ADVIL,MOTRIN Take 600 mg by mouth daily as needed for headache or moderate pain.   KRILL OIL PO Take 1 capsule by mouth daily.   multivitamin with minerals Tabs tablet Take 1 tablet by mouth daily.   senna 8.6 MG Tabs tablet Commonly known as:  SENOKOT Take 1 tablet by mouth daily as needed for mild constipation.   VITAMIN B-12 CR PO Take 1 tablet by mouth daily.   VITAMIN D PO Take 1 capsule by mouth daily.      Follow-up Information    Lorene Dy, MD. Schedule an appointment as soon as possible for a visit in 1 week(s).   Specialty:  Internal Medicine Contact information: [address], [address] [place] [address] [phone]          Allergies  Allergen Reactions  . Demerol [Meperidine] Nausea And Vomiting and Other (See Comments)    SEVERE NAUSEA     Consultations:  IR    Procedures/Studies: Ct Renal Stone Study  Result Date: 06/15/2018 CLINICAL DATA:  67 y/o M; left-sided abdominal pain with fever. Left nephroureteral stent placed 05/20/2018. History of bladder cancer. EXAM: CT ABDOMEN AND PELVIS WITHOUT CONTRAST TECHNIQUE: Multidetector CT imaging of the abdomen and pelvis was performed following the standard protocol without IV contrast. COMPARISON:  03/10/2012 CT abdomen and pelvis. FINDINGS: Lower chest: Stable 4 mm nodules at the lung bases. Small hiatal hernia. Hepatobiliary: No focal liver abnormality is seen. Status post cholecystectomy. No biliary dilatation. Pancreas: Stable 10 mm cyst within the pancreas body (series 2, image 26). No pancreatic ductal dilatation or surrounding inflammatory changes. Spleen: Normal in size without focal abnormality. Adrenals/Urinary Tract: The left-sided ureteral stent has migrated slightly downstream although still coiling within the lower pole of left kidney. There has been interval development of mild left pelvicaliectasis and moderate diffuse perinephric stranding as well as swelling of the left kidney. Normal right kidney and ureter. Right lower quadrant ileal conduit is stable. Stomach/Bowel: Stomach is within normal limits. Extensive sigmoid diverticulosis without findings of acute diverticulitis. No evidence of bowel wall  thickening, distention, or inflammatory changes. Vascular/Lymphatic: Aortic atherosclerosis. No enlarged abdominal or pelvic lymph nodes. Reproductive: Prostatectomy. Other: Stable midline ventral abdominal hernia containing fat and small bowel as well as large hernia adjacent to the right lower quadrant ileal conduit containing fat and small bowel. There are no obstructive changes of the bowel or associated inflammation. Musculoskeletal: Interval age-indeterminate L1 superior endplate fracture with mild central loss of vertebral body height. IMPRESSION: 1. Left kidney swelling,  perinephric stranding, and pelvicaliectasis likely representing infection of the renal collecting system/pyelonephritis or stent dysfunction. The stent has migrated slightly downstream although still coiling within lower pole of left kidney. 2. Small hiatal hernia, ventral midline abdominal hernia, and hernia adjacent to the right lower quadrant ileal conduit. The ventral and right lower quadrant hernias contain fat and small bowel without obstruction or inflammation. 3. Stable pancreas body cystic lesion, annual follow-up recommended. 4. Aortic atherosclerosis. 5. Sigmoid diverticulosis without findings of acute diverticulitis. 6. Stable small nodules within the lung bases. Electronically Signed   By: Kristine Garbe M.D.   On: 06/15/2018 02:43   Ir Ext Nephroureteral Cath Exchange  Result Date: 06/16/2018 INDICATION: History of bladder cancer with chronic left-sided nephroureteral catheter via ileostomy. Patient now admitted with urinary tract infection and CT scan demonstrating mild left-sided pelvicaliectasis. As such, request made for fluoroscopic guided nephroureteral catheter exchange. EXAM: FLUOROSCOPIC GUIDED LEFT SIDED NEPHROURETERAL CATHETER EXCHANGE COMPARISON:  CT abdomen and pelvis-01/13/2018; multiple previous fluoroscopic guided nephroureteral catheter exchanges, most recently on 05/19/2018 CONTRAST:  10 mL Isovue-300 administered into the left renal collecting system FLUOROSCOPY TIME:  1 minute, 30 seconds (60 mGy) COMPLICATIONS: None immediate. TECHNIQUE: Informed written consent was obtained from the patient after a discussion of the risks, benefits and alternatives to treatment. Questions regarding the procedure were encouraged and answered. A timeout was performed prior to the initiation of the procedure. External portion of the existing retrograde nephroureteral catheter as well as the surrounding skin was draped in the usual sterile fashion. A sterile drape was applied covering the  operative field. Maximum barrier sterile technique with sterile gowns and gloves were used for the procedure. A timeout was performed prior to the initiation of the procedure. A pre procedural spot fluoroscopic image was obtained after contrast was injected via the existing nephroureteral catheter demonstrating appropriate positioning within the renal pelvis. The existing catheter was cut and cannulated with an Amplatz wire which was coiled within the renal pelvis. Under intermittent fluoroscopic guidance, the existing nephrostomy catheter was exchanged for a new 12 French, 45 cm all-purpose drainage catheter. Contrast injection confirmed appropriate positioning within the renal pelvis and a post exchange fluoroscopic image was obtained. The catheter was locked and the patient's urostomy was reapplied. The patient tolerated the procedure well without immediate postprocedural complication. FINDINGS: The existing nephroureteral catheter is appropriately positioned and functioning. After successful fluoroscopic guided exchange, the new nephrostomy catheter is coiled and locked within the left renal pelvis. IMPRESSION: Successful fluoroscopic guided exchange of left sided 12 French, 45 cm nephroureteral catheter. Electronically Signed   By: Sandi Mariscal M.D.   On: 06/16/2018 13:23   Ir Ext Nephroureteral Cath Exchange  Result Date: 05/19/2018 INDICATION: Left ureteral obstruction EXAM: FLUOROSCOPIC EXCHANGE OF THE LEFT NEPHROURETERAL CATHETER COMPARISON:  04/11/2018 MEDICATIONS: NONE. ANESTHESIA/SEDATION: Moderate Sedation Time:  None. The patient was continuously monitored during the procedure by the interventional radiology nurse under my direct supervision. CONTRAST:  10 cc Isovue-administered into the collecting system(s) FLUOROSCOPY TIME:  Fluoroscopy Time: 0 minutes 54 seconds (36  mGy). COMPLICATIONS: None immediate. PROCEDURE: Informed written consent was obtained from the patient after a thorough discussion of  the procedural risks, benefits and alternatives. All questions were addressed. Maximal Sterile Barrier Technique was utilized including caps, mask, sterile gowns, sterile gloves, sterile drape, hand hygiene and skin antiseptic. A timeout was performed prior to the initiation of the procedure. Under sterile conditions, the existing left nephroureteral catheter exiting the ileal conduit was injected with contrast confirming position. Catheter was successfully exchanged over a Bentson guidewire. Retention loop formed the renal pelvis. Contrast injection confirms position. Images obtained for documentation. Catheter secured within the ostomy drainage bag. IMPRESSION: Successful fluoroscopic replacement of the left nephroureteral catheter through the existing ostomy Electronically Signed   By: Jerilynn Mages.  Shick M.D.   On: 05/19/2018 16:22      Discharge Exam: Vitals:   06/16/18 2207 06/17/18 0614  BP: 135/77 121/80  Pulse: 84 68  Resp: 17 17  Temp: 98.7 F (37.1 C) 98 F (36.7 C)  SpO2: 97% 97%    General: Pt is alert, awake, not in acute distress Cardiovascular: RRR, S1/S2 +, no rubs, no gallops Respiratory: CTA bilaterally, no wheezing, no rhonchi Abdominal: Soft, NT, ND, bowel sounds + Extremities: no edema, no cyanosis    The results of significant diagnostics from this hospitalization (including imaging, microbiology, ancillary and laboratory) are listed below for reference.     Microbiology: Recent Results (from the past 240 hour(s))  Urine culture     Status: Abnormal   Collection Time: 06/15/18 12:17 AM  Result Value Ref Range Status   Specimen Description   Final    URINE, RANDOM Performed at [facility] [address], [place], [place] [address]    Special Requests   Final    NONE Performed at [facility], [facility] [facility] [address], [place], [place] [address]    Culture MULTIPLE SPECIES PRESENT, SUGGEST RECOLLECTION (A)  Final   Report Status  06/16/2018 FINAL  Final     Labs: BNP (last 3 results) No results for input(s): BNP in the last 8760 hours. Basic Metabolic Panel: Recent Labs  Lab 06/14/18 2310 06/15/18 0600 06/16/18 0425 06/17/18 0420  NA 141 141 141 141  K 5.2* 3.9 4.3 3.7  CL 107 109 111 109  CO2 23 24 24 22   GLUCOSE 122* 108* 100* 109*  BUN 20 21 16 20   CREATININE 1.40* 1.30* 1.01 0.85  CALCIUM 9.3 8.7* 8.5* 8.7*  PHOS  --  3.9  --   --    Liver Function Tests: No results for input(s): AST, ALT, ALKPHOS, BILITOT, PROT, ALBUMIN in the last 168 hours. No results for input(s): LIPASE, AMYLASE in the last 168 hours. No results for input(s): AMMONIA in the last 168 hours. CBC: Recent Labs  Lab 06/14/18 2310 06/14/18 2318 06/15/18 0600 06/16/18 0425 06/17/18 0420  WBC 18.0* 17.0* 14.1* 9.6 8.9  NEUTROABS  --  13.2*  --   --   --   HGB 16.0 15.8 13.7 12.9* 13.4  HCT 48.6 48.4 42.3 40.7 41.3  MCV 77.6* 78.3 77.9* 77.8* 76.9*  PLT 170 167 164 147* 153   Cardiac Enzymes: No results for input(s): CKTOTAL, CKMB, CKMBINDEX, TROPONINI in the last 168 hours. BNP: Invalid input(s): POCBNP CBG: No results for input(s): GLUCAP in the last 168 hours. D-Dimer No results for input(s): DDIMER in the last 72 hours. Hgb A1c No results for input(s): HGBA1C in the last 72 hours. Lipid Profile No results for  input(s): CHOL, HDL, LDLCALC, TRIG, CHOLHDL, LDLDIRECT in the last 72 hours. Thyroid function studies No results for input(s): TSH, T4TOTAL, T3FREE, THYROIDAB in the last 72 hours.  Invalid input(s): FREET3 Anemia work up No results for input(s): VITAMINB12, FOLATE, FERRITIN, TIBC, IRON, RETICCTPCT in the last 72 hours. Urinalysis    Component Value Date/Time   COLORURINE AMBER (A) 06/14/2018 2310   APPEARANCEUR TURBID (A) 06/14/2018 2310   LABSPEC 1.015 06/14/2018 2310   PHURINE 7.0 06/14/2018 2310   GLUCOSEU NEGATIVE 06/14/2018 2310   HGBUR MODERATE (A) 06/14/2018 2310   BILIRUBINUR NEGATIVE  06/14/2018 2310   KETONESUR NEGATIVE 06/14/2018 2310   PROTEINUR 100 (A) 06/14/2018 2310   NITRITE POSITIVE (A) 06/14/2018 2310   LEUKOCYTESUR MODERATE (A) 06/14/2018 2310   Sepsis Labs Invalid input(s): PROCALCITONIN,  WBC,  LACTICIDVEN Microbiology Recent Results (from the past 240 hour(s))  Urine culture     Status: Abnormal   Collection Time: 06/15/18 12:17 AM  Result Value Ref Range Status   Specimen Description   Final    URINE, RANDOM Performed at [facility], [place] [address], [place], [place] [address]    Special Requests   Final    NONE Performed at [facility], [place] [address], [place],  [address]    Culture MULTIPLE SPECIES PRESENT, SUGGEST RECOLLECTION (A)  Final   Report Status 06/16/2018 FINAL  Final     Patient was seen and examined on the day of discharge and was found to be in stable condition. Time coordinating discharge: 25 minutes including assessment and coordination of care, as well as examination of the patient.   SIGNED:  Dessa Phi, DO [facility] Pager [phone]  If 7PM-7AM, please contact night-coverage [email or web address] Password [email or web address] 06/17/2018, 8:52 AM

## 2018-06-17 NOTE — Progress Notes (Signed)
Discharge instructions given to pt and all questions were answered. Pt walked to the lobby and was picked up by his wife.

## 2018-06-23 ENCOUNTER — Other Ambulatory Visit (HOSPITAL_COMMUNITY): Payer: Medicare Other

## 2018-06-27 ENCOUNTER — Ambulatory Visit (HOSPITAL_COMMUNITY): Payer: Medicare Other

## 2018-06-27 ENCOUNTER — Other Ambulatory Visit (HOSPITAL_COMMUNITY): Payer: Medicare Other

## 2018-07-17 ENCOUNTER — Other Ambulatory Visit: Payer: Self-pay | Admitting: Student

## 2018-07-18 ENCOUNTER — Ambulatory Visit (HOSPITAL_COMMUNITY)
Admit: 2018-07-18 | Discharge: 2018-07-18 | Disposition: A | Discharge status: Home / Self Care | Payer: Medicare Other | Attending: Interventional Radiology | Admitting: Interventional Radiology

## 2018-07-18 ENCOUNTER — Other Ambulatory Visit (HOSPITAL_COMMUNITY): Payer: Self-pay | Admitting: Interventional Radiology

## 2018-07-18 ENCOUNTER — Encounter (HOSPITAL_COMMUNITY): Payer: Self-pay

## 2018-07-18 ENCOUNTER — Other Ambulatory Visit: Payer: Self-pay

## 2018-07-18 ENCOUNTER — Ambulatory Visit (HOSPITAL_COMMUNITY)
Admission: RE | Admit: 2018-07-18 | Discharge: 2018-07-18 | Disposition: A | Discharge status: Home / Self Care | Payer: Medicare Other | Source: Ambulatory Visit | Attending: Urology | Admitting: Urology

## 2018-07-18 DIAGNOSIS — Z4803 Encounter for change or removal of drains: Secondary | ICD-10-CM | POA: Diagnosis not present

## 2018-07-18 DIAGNOSIS — N135 Crossing vessel and stricture of ureter without hydronephrosis: Secondary | ICD-10-CM

## 2018-07-18 HISTORY — PX: IR CATHETER TUBE CHANGE: IMG717

## 2018-07-18 MED ORDER — IOPAMIDOL (ISOVUE-300) INJECTION 61%
10.0000 mL | Freq: Once | INTRAVENOUS | Status: AC | PRN
  Administered 2018-07-18: 10 mL

## 2018-07-18 MED ORDER — CEFAZOLIN SODIUM-DEXTROSE 2-4 GM/100ML-% IV SOLN
2.0000 g | Freq: Once | INTRAVENOUS | Status: AC
  Administered 2018-07-18: 2 g via INTRAVENOUS

## 2018-07-18 MED ORDER — CEFAZOLIN SODIUM-DEXTROSE 2-4 GM/100ML-% IV SOLN
INTRAVENOUS | Status: AC
  Filled 2018-07-18: qty 100

## 2018-07-18 MED ORDER — IOPAMIDOL (ISOVUE-300) INJECTION 61%
INTRAVENOUS | Status: AC
  Administered 2018-07-18: 10 mL
  Filled 2018-07-18: qty 50

## 2018-07-18 NOTE — Procedures (Signed)
Interventional Radiology Procedure Note  Procedure: Routine exchange of left retrograde nephroureteral drain, into ostomy. 69F.   Complications: None Recommendations:  - [place] home - Routine exchange  Signed,  Dulcy Fanny. Earleen Newport, DO

## 2018-07-23 ENCOUNTER — Ambulatory Visit (HOSPITAL_COMMUNITY): Payer: Medicare Other

## 2018-08-13 DIAGNOSIS — R509 Fever, unspecified: Secondary | ICD-10-CM | POA: Diagnosis not present

## 2018-08-13 DIAGNOSIS — Z936 Other artificial openings of urinary tract status: Secondary | ICD-10-CM | POA: Diagnosis not present

## 2018-08-13 DIAGNOSIS — R110 Nausea: Secondary | ICD-10-CM | POA: Diagnosis not present

## 2018-08-13 DIAGNOSIS — N3000 Acute cystitis without hematuria: Secondary | ICD-10-CM | POA: Diagnosis not present

## 2018-08-15 DIAGNOSIS — C678 Malignant neoplasm of overlapping sites of bladder: Secondary | ICD-10-CM | POA: Diagnosis not present

## 2018-08-22 ENCOUNTER — Other Ambulatory Visit (HOSPITAL_COMMUNITY): Payer: Self-pay | Admitting: Interventional Radiology

## 2018-08-22 ENCOUNTER — Encounter (HOSPITAL_COMMUNITY): Payer: Self-pay

## 2018-08-22 ENCOUNTER — Ambulatory Visit (HOSPITAL_COMMUNITY)
Admission: RE | Admit: 2018-08-22 | Discharge: 2018-08-22 | Disposition: A | Discharge status: Home / Self Care | Payer: Medicare Other | Source: Ambulatory Visit | Attending: Interventional Radiology | Admitting: Interventional Radiology

## 2018-08-22 ENCOUNTER — Ambulatory Visit (HOSPITAL_COMMUNITY)
Admission: RE | Admit: 2018-08-22 | Discharge: 2018-08-22 | Disposition: A | Discharge status: Home / Self Care | Payer: Medicare Other | Source: Ambulatory Visit | Attending: Urology | Admitting: Urology

## 2018-08-22 DIAGNOSIS — Z436 Encounter for attention to other artificial openings of urinary tract: Secondary | ICD-10-CM | POA: Insufficient documentation

## 2018-08-22 DIAGNOSIS — N135 Crossing vessel and stricture of ureter without hydronephrosis: Secondary | ICD-10-CM

## 2018-08-22 DIAGNOSIS — Z8551 Personal history of malignant neoplasm of bladder: Secondary | ICD-10-CM | POA: Diagnosis not present

## 2018-08-22 DIAGNOSIS — R7881 Bacteremia: Secondary | ICD-10-CM | POA: Insufficient documentation

## 2018-08-22 DIAGNOSIS — Z906 Acquired absence of other parts of urinary tract: Secondary | ICD-10-CM | POA: Insufficient documentation

## 2018-08-22 HISTORY — PX: IR CATHETER TUBE CHANGE: IMG717

## 2018-08-22 MED ORDER — SODIUM CHLORIDE 0.9 % IV SOLN
INTRAVENOUS | Status: AC
  Filled 2018-08-22: qty 20

## 2018-08-22 MED ORDER — SODIUM CHLORIDE 0.9 % IV SOLN
1.0000 g | Freq: Once | INTRAVENOUS | Status: AC
  Administered 2018-08-22: 1 g via INTRAVENOUS
  Filled 2018-08-22: qty 10

## 2018-08-22 MED ORDER — IOPAMIDOL (ISOVUE-300) INJECTION 61%
50.0000 mL | Freq: Once | INTRAVENOUS | Status: AC | PRN
  Administered 2018-08-22: 10 mL

## 2018-08-22 MED ORDER — IOPAMIDOL (ISOVUE-300) INJECTION 61%
INTRAVENOUS | Status: AC
  Administered 2018-08-22: 10 mL
  Filled 2018-08-22: qty 50

## 2018-08-22 MED ORDER — SODIUM CHLORIDE 0.9 % IV SOLN
INTRAVENOUS | Status: DC
  Administered 2018-08-22: 15:00:00 via INTRAVENOUS

## 2018-08-22 NOTE — Procedures (Signed)
Interventional Radiology Procedure Note  Procedure: Exchange of left retrograde nephroureteral tube via RLQ ostomy  Complications: None  Estimated Blood Loss: None  Recommendations: DC home  Signed,  Criselda Peaches, MD

## 2018-08-25 ENCOUNTER — Other Ambulatory Visit (HOSPITAL_COMMUNITY): Payer: Self-pay | Admitting: Interventional Radiology

## 2018-08-25 ENCOUNTER — Encounter (HOSPITAL_COMMUNITY): Payer: Self-pay | Admitting: Interventional Radiology

## 2018-08-25 DIAGNOSIS — N135 Crossing vessel and stricture of ureter without hydronephrosis: Secondary | ICD-10-CM

## 2018-08-29 DIAGNOSIS — C678 Malignant neoplasm of overlapping sites of bladder: Secondary | ICD-10-CM | POA: Diagnosis not present

## 2018-08-29 DIAGNOSIS — K861 Other chronic pancreatitis: Secondary | ICD-10-CM | POA: Diagnosis not present

## 2018-08-29 DIAGNOSIS — K469 Unspecified abdominal hernia without obstruction or gangrene: Secondary | ICD-10-CM | POA: Diagnosis not present

## 2018-09-03 DIAGNOSIS — C61 Malignant neoplasm of prostate: Secondary | ICD-10-CM | POA: Diagnosis not present

## 2018-09-03 DIAGNOSIS — R8271 Bacteriuria: Secondary | ICD-10-CM | POA: Diagnosis not present

## 2018-09-03 DIAGNOSIS — C678 Malignant neoplasm of overlapping sites of bladder: Secondary | ICD-10-CM | POA: Diagnosis not present

## 2018-09-26 ENCOUNTER — Ambulatory Visit (HOSPITAL_COMMUNITY)
Admission: RE | Admit: 2018-09-26 | Discharge: 2018-09-26 | Disposition: A | Discharge status: Home / Self Care | Payer: Medicare Other | Source: Ambulatory Visit | Attending: Interventional Radiology | Admitting: Interventional Radiology

## 2018-09-26 ENCOUNTER — Ambulatory Visit (HOSPITAL_COMMUNITY)
Admission: RE | Admit: 2018-09-26 | Discharge: 2018-09-26 | Disposition: A | Discharge status: Home / Self Care | Payer: Medicare Other | Source: Ambulatory Visit | Attending: Urology | Admitting: Urology

## 2018-09-26 ENCOUNTER — Encounter (HOSPITAL_COMMUNITY): Payer: Self-pay

## 2018-09-26 ENCOUNTER — Other Ambulatory Visit (HOSPITAL_COMMUNITY): Payer: Self-pay | Admitting: Interventional Radiology

## 2018-09-26 DIAGNOSIS — Z906 Acquired absence of other parts of urinary tract: Secondary | ICD-10-CM | POA: Insufficient documentation

## 2018-09-26 DIAGNOSIS — Z436 Encounter for attention to other artificial openings of urinary tract: Secondary | ICD-10-CM | POA: Diagnosis not present

## 2018-09-26 DIAGNOSIS — N135 Crossing vessel and stricture of ureter without hydronephrosis: Secondary | ICD-10-CM

## 2018-09-26 DIAGNOSIS — Z8551 Personal history of malignant neoplasm of bladder: Secondary | ICD-10-CM | POA: Insufficient documentation

## 2018-09-26 MED ORDER — LIDOCAINE HCL 1 % IJ SOLN
INTRAMUSCULAR | Status: AC
  Filled 2018-09-26: qty 20

## 2018-09-26 MED ORDER — SODIUM CHLORIDE 0.9 % IV SOLN
1.0000 g | INTRAVENOUS | Status: DC
  Filled 2018-09-26: qty 10

## 2018-09-26 MED ORDER — IOPAMIDOL (ISOVUE-370) INJECTION 76%
INTRAVENOUS | Status: AC
  Filled 2018-09-26: qty 100

## 2018-09-26 MED ORDER — SODIUM CHLORIDE 0.9 % IV SOLN
INTRAVENOUS | Status: DC
  Administered 2018-09-26: 16:00:00 via INTRAVENOUS

## 2018-09-26 MED ORDER — SODIUM CHLORIDE 0.9 % IV SOLN
1.0000 g | Freq: Once | INTRAVENOUS | Status: AC
  Administered 2018-09-26: 1 g via INTRAVENOUS
  Filled 2018-09-26: qty 1

## 2018-09-26 MED ORDER — IOPAMIDOL (ISOVUE-300) INJECTION 61%
50.0000 mL | Freq: Once | INTRAVENOUS | Status: AC | PRN
  Administered 2018-09-26: 15 mL

## 2018-10-01 ENCOUNTER — Ambulatory Visit (HOSPITAL_COMMUNITY): Payer: Medicare Other

## 2018-10-22 DIAGNOSIS — R8290 Unspecified abnormal findings in urine: Secondary | ICD-10-CM | POA: Diagnosis not present

## 2018-10-22 DIAGNOSIS — Z87440 Personal history of urinary (tract) infections: Secondary | ICD-10-CM | POA: Diagnosis not present

## 2018-10-22 DIAGNOSIS — Z8551 Personal history of malignant neoplasm of bladder: Secondary | ICD-10-CM | POA: Diagnosis not present

## 2018-10-30 ENCOUNTER — Other Ambulatory Visit: Payer: Self-pay | Admitting: Student

## 2018-10-31 ENCOUNTER — Other Ambulatory Visit: Payer: Self-pay

## 2018-10-31 ENCOUNTER — Ambulatory Visit (HOSPITAL_COMMUNITY)
Admission: RE | Admit: 2018-10-31 | Discharge: 2018-10-31 | Disposition: A | Discharge status: Home / Self Care | Payer: Medicare Other | Source: Ambulatory Visit | Attending: Interventional Radiology | Admitting: Interventional Radiology

## 2018-10-31 ENCOUNTER — Ambulatory Visit (HOSPITAL_COMMUNITY)
Admission: RE | Admit: 2018-10-31 | Discharge: 2018-10-31 | Disposition: A | Discharge status: Home / Self Care | Payer: Medicare Other | Source: Ambulatory Visit | Attending: Urology | Admitting: Urology

## 2018-10-31 ENCOUNTER — Encounter (HOSPITAL_COMMUNITY): Payer: Self-pay

## 2018-10-31 ENCOUNTER — Other Ambulatory Visit (HOSPITAL_COMMUNITY): Payer: Self-pay | Admitting: Interventional Radiology

## 2018-10-31 DIAGNOSIS — N135 Crossing vessel and stricture of ureter without hydronephrosis: Secondary | ICD-10-CM | POA: Insufficient documentation

## 2018-10-31 DIAGNOSIS — Z436 Encounter for attention to other artificial openings of urinary tract: Secondary | ICD-10-CM | POA: Diagnosis not present

## 2018-10-31 HISTORY — PX: IR CATHETER TUBE CHANGE: IMG717

## 2018-10-31 MED ORDER — IOPAMIDOL (ISOVUE-300) INJECTION 61%
INTRAVENOUS | Status: AC
  Administered 2018-10-31: 5 mL via URETHRAL
  Filled 2018-10-31: qty 50

## 2018-10-31 MED ORDER — IOPAMIDOL (ISOVUE-300) INJECTION 61%
50.0000 mL | Freq: Once | INTRAVENOUS | Status: AC | PRN
  Administered 2018-10-31: 5 mL via URETHRAL

## 2018-10-31 MED ORDER — CEFAZOLIN SODIUM-DEXTROSE 2-4 GM/100ML-% IV SOLN
2.0000 g | Freq: Once | INTRAVENOUS | Status: AC
  Administered 2018-10-31: 2 g via INTRAVENOUS

## 2018-10-31 MED ORDER — CEFAZOLIN SODIUM-DEXTROSE 2-4 GM/100ML-% IV SOLN
INTRAVENOUS | Status: AC
  Administered 2018-10-31: 2 g via INTRAVENOUS
  Filled 2018-10-31: qty 100

## 2018-10-31 MED ORDER — SODIUM CHLORIDE 0.9 % IV SOLN
INTRAVENOUS | Status: DC
  Administered 2018-10-31: 15:00:00 via INTRAVENOUS

## 2018-10-31 NOTE — Procedures (Signed)
Left ureteral obstruction  S/p left nephroureteral cath exchg  No comp Stable EBL 0 Full report in pacs

## 2018-11-12 DIAGNOSIS — S61210A Laceration without foreign body of right index finger without damage to nail, initial encounter: Secondary | ICD-10-CM | POA: Diagnosis not present

## 2018-11-12 DIAGNOSIS — S68120A Partial traumatic metacarpophalangeal amputation of right index finger, initial encounter: Secondary | ICD-10-CM | POA: Diagnosis not present

## 2018-11-24 DIAGNOSIS — Z23 Encounter for immunization: Secondary | ICD-10-CM | POA: Diagnosis not present

## 2018-12-04 ENCOUNTER — Other Ambulatory Visit: Payer: Self-pay | Admitting: Student

## 2018-12-05 ENCOUNTER — Other Ambulatory Visit (HOSPITAL_COMMUNITY): Payer: Self-pay | Admitting: Interventional Radiology

## 2018-12-05 ENCOUNTER — Encounter (HOSPITAL_COMMUNITY): Payer: Self-pay

## 2018-12-05 ENCOUNTER — Other Ambulatory Visit: Payer: Self-pay

## 2018-12-05 ENCOUNTER — Ambulatory Visit (HOSPITAL_COMMUNITY)
Admission: RE | Admit: 2018-12-05 | Discharge: 2018-12-05 | Disposition: A | Discharge status: Home / Self Care | Payer: Medicare Other | Source: Ambulatory Visit | Attending: Urology | Admitting: Urology

## 2018-12-05 ENCOUNTER — Ambulatory Visit (HOSPITAL_COMMUNITY)
Admission: RE | Admit: 2018-12-05 | Discharge: 2018-12-05 | Disposition: A | Discharge status: Home / Self Care | Payer: Medicare Other | Source: Ambulatory Visit | Attending: Interventional Radiology | Admitting: Interventional Radiology

## 2018-12-05 DIAGNOSIS — R000 Tachycardia, unspecified: Secondary | ICD-10-CM | POA: Diagnosis not present

## 2018-12-05 DIAGNOSIS — R509 Fever, unspecified: Secondary | ICD-10-CM | POA: Diagnosis not present

## 2018-12-05 DIAGNOSIS — R109 Unspecified abdominal pain: Secondary | ICD-10-CM | POA: Diagnosis not present

## 2018-12-05 DIAGNOSIS — N135 Crossing vessel and stricture of ureter without hydronephrosis: Secondary | ICD-10-CM

## 2018-12-05 DIAGNOSIS — Z8551 Personal history of malignant neoplasm of bladder: Secondary | ICD-10-CM | POA: Diagnosis not present

## 2018-12-05 DIAGNOSIS — K5730 Diverticulosis of large intestine without perforation or abscess without bleeding: Secondary | ICD-10-CM | POA: Diagnosis not present

## 2018-12-05 DIAGNOSIS — N136 Pyonephrosis: Secondary | ICD-10-CM | POA: Diagnosis not present

## 2018-12-05 DIAGNOSIS — Z8546 Personal history of malignant neoplasm of prostate: Secondary | ICD-10-CM | POA: Diagnosis not present

## 2018-12-05 DIAGNOSIS — N390 Urinary tract infection, site not specified: Secondary | ICD-10-CM | POA: Diagnosis not present

## 2018-12-05 DIAGNOSIS — K219 Gastro-esophageal reflux disease without esophagitis: Secondary | ICD-10-CM | POA: Diagnosis not present

## 2018-12-05 DIAGNOSIS — Z906 Acquired absence of other parts of urinary tract: Secondary | ICD-10-CM | POA: Diagnosis not present

## 2018-12-05 DIAGNOSIS — T8144XA Sepsis following a procedure, initial encounter: Secondary | ICD-10-CM | POA: Diagnosis not present

## 2018-12-05 DIAGNOSIS — Z936 Other artificial openings of urinary tract status: Secondary | ICD-10-CM | POA: Diagnosis not present

## 2018-12-05 DIAGNOSIS — R7881 Bacteremia: Secondary | ICD-10-CM | POA: Diagnosis not present

## 2018-12-05 HISTORY — PX: IR NEPHROSTOGRAM LEFT THRU EXISTING ACCESS: IMG6061

## 2018-12-05 MED ORDER — IOPAMIDOL (ISOVUE-300) INJECTION 61%
INTRAVENOUS | Status: AC
  Administered 2018-12-05: 40 mL
  Filled 2018-12-05: qty 50

## 2018-12-05 MED ORDER — SODIUM CHLORIDE 0.9 % IV SOLN
INTRAVENOUS | Status: DC
  Administered 2018-12-05: 15:00:00 via INTRAVENOUS

## 2018-12-05 MED ORDER — CEFAZOLIN SODIUM-DEXTROSE 2-4 GM/100ML-% IV SOLN
2.0000 g | Freq: Once | INTRAVENOUS | Status: AC
  Administered 2018-12-05: 2 g via INTRAVENOUS
  Filled 2018-12-05: qty 100

## 2018-12-05 MED ORDER — IOPAMIDOL (ISOVUE-300) INJECTION 61%
50.0000 mL | Freq: Once | INTRAVENOUS | Status: AC | PRN
  Administered 2018-12-05: 40 mL

## 2018-12-05 NOTE — Discharge Instructions (Signed)
Call Dr [name] for any increase in flank pain, blood in urine , or elevated temperature.

## 2018-12-06 ENCOUNTER — Other Ambulatory Visit: Payer: Self-pay

## 2018-12-06 ENCOUNTER — Emergency Department (HOSPITAL_COMMUNITY): Payer: Medicare Other

## 2018-12-06 ENCOUNTER — Inpatient Hospital Stay (HOSPITAL_COMMUNITY)
Admission: EM | Admit: 2018-12-06 | Discharge: 2018-12-09 | DRG: 863 | Disposition: A | Discharge status: Home / Self Care | Payer: Medicare Other | Attending: Internal Medicine | Admitting: Internal Medicine

## 2018-12-06 ENCOUNTER — Encounter (HOSPITAL_COMMUNITY): Payer: Self-pay | Admitting: Interventional Radiology

## 2018-12-06 DIAGNOSIS — N12 Tubulo-interstitial nephritis, not specified as acute or chronic: Secondary | ICD-10-CM | POA: Diagnosis present

## 2018-12-06 DIAGNOSIS — B9620 Unspecified Escherichia coli [E. coli] as the cause of diseases classified elsewhere: Secondary | ICD-10-CM

## 2018-12-06 DIAGNOSIS — K219 Gastro-esophageal reflux disease without esophagitis: Secondary | ICD-10-CM | POA: Diagnosis present

## 2018-12-06 DIAGNOSIS — R509 Fever, unspecified: Secondary | ICD-10-CM | POA: Diagnosis not present

## 2018-12-06 DIAGNOSIS — Z801 Family history of malignant neoplasm of trachea, bronchus and lung: Secondary | ICD-10-CM

## 2018-12-06 DIAGNOSIS — K449 Diaphragmatic hernia without obstruction or gangrene: Secondary | ICD-10-CM | POA: Diagnosis present

## 2018-12-06 DIAGNOSIS — R7881 Bacteremia: Secondary | ICD-10-CM

## 2018-12-06 DIAGNOSIS — R9431 Abnormal electrocardiogram [ECG] [EKG]: Secondary | ICD-10-CM | POA: Diagnosis present

## 2018-12-06 DIAGNOSIS — R109 Unspecified abdominal pain: Secondary | ICD-10-CM | POA: Diagnosis not present

## 2018-12-06 DIAGNOSIS — Z87891 Personal history of nicotine dependence: Secondary | ICD-10-CM

## 2018-12-06 DIAGNOSIS — T8144XA Sepsis following a procedure, initial encounter: Principal | ICD-10-CM | POA: Diagnosis present

## 2018-12-06 DIAGNOSIS — Z8551 Personal history of malignant neoplasm of bladder: Secondary | ICD-10-CM

## 2018-12-06 DIAGNOSIS — Z9049 Acquired absence of other specified parts of digestive tract: Secondary | ICD-10-CM

## 2018-12-06 DIAGNOSIS — Z885 Allergy status to narcotic agent status: Secondary | ICD-10-CM

## 2018-12-06 DIAGNOSIS — A419 Sepsis, unspecified organism: Secondary | ICD-10-CM | POA: Diagnosis present

## 2018-12-06 DIAGNOSIS — Z8546 Personal history of malignant neoplasm of prostate: Secondary | ICD-10-CM

## 2018-12-06 DIAGNOSIS — N390 Urinary tract infection, site not specified: Secondary | ICD-10-CM | POA: Diagnosis not present

## 2018-12-06 DIAGNOSIS — Z906 Acquired absence of other parts of urinary tract: Secondary | ICD-10-CM

## 2018-12-06 DIAGNOSIS — I10 Essential (primary) hypertension: Secondary | ICD-10-CM | POA: Diagnosis present

## 2018-12-06 DIAGNOSIS — R000 Tachycardia, unspecified: Secondary | ICD-10-CM | POA: Diagnosis not present

## 2018-12-06 DIAGNOSIS — C679 Malignant neoplasm of bladder, unspecified: Secondary | ICD-10-CM | POA: Diagnosis present

## 2018-12-06 DIAGNOSIS — K5730 Diverticulosis of large intestine without perforation or abscess without bleeding: Secondary | ICD-10-CM | POA: Diagnosis not present

## 2018-12-06 DIAGNOSIS — N1330 Unspecified hydronephrosis: Secondary | ICD-10-CM | POA: Diagnosis present

## 2018-12-06 DIAGNOSIS — N136 Pyonephrosis: Secondary | ICD-10-CM | POA: Diagnosis present

## 2018-12-06 DIAGNOSIS — I4510 Unspecified right bundle-branch block: Secondary | ICD-10-CM | POA: Diagnosis present

## 2018-12-06 DIAGNOSIS — Z8249 Family history of ischemic heart disease and other diseases of the circulatory system: Secondary | ICD-10-CM

## 2018-12-06 LAB — COMPREHENSIVE METABOLIC PANEL
ALK PHOS: 52 U/L (ref 38–126)
ALT: 17 U/L (ref 0–44)
AST: 17 U/L (ref 15–41)
Albumin: 4.1 g/dL (ref 3.5–5.0)
Anion gap: 8 (ref 5–15)
BUN: 20 mg/dL (ref 8–23)
CO2: 23 mmol/L (ref 22–32)
Calcium: 9.2 mg/dL (ref 8.9–10.3)
Chloride: 109 mmol/L (ref 98–111)
Creatinine, Ser: 1 mg/dL (ref 0.61–1.24)
GFR calc non Af Amer: >60 mL/min (ref >60)
Glucose, Bld: 109 mg/dL — ABNORMAL HIGH (ref 70–99)
Potassium: 3.7 mmol/L (ref 3.5–5.1)
Sodium: 140 mmol/L (ref 135–145)
Total Bilirubin: 0.6 mg/dL (ref 0.3–1.2)
Total Protein: 7.6 g/dL (ref 6.5–8.1)

## 2018-12-06 LAB — CBC WITH DIFFERENTIAL/PLATELET
Abs Immature Granulocytes: 0.08 10*3/uL — ABNORMAL HIGH (ref 0.00–0.07)
Basophils Absolute: 0.1 10*3/uL (ref 0.0–0.1)
Basophils Relative: 0 %
EOS PCT: 2 %
Eosinophils Absolute: 0.3 10*3/uL (ref 0.0–0.5)
HCT: 50.1 % (ref 39.0–52.0)
Hemoglobin: 16 g/dL (ref 13.0–17.0)
Immature Granulocytes: 1 %
Lymphocytes Relative: 11 %
Lymphs Abs: 1.6 10*3/uL (ref 0.7–4.0)
MCH: 26.8 pg (ref 26.0–34.0)
MCHC: 31.9 g/dL (ref 30.0–36.0)
MCV: 84.1 fL (ref 80.0–100.0)
Monocytes Absolute: 2 10*3/uL — ABNORMAL HIGH (ref 0.1–1.0)
Monocytes Relative: 14 %
Neutro Abs: 10.7 10*3/uL — ABNORMAL HIGH (ref 1.7–7.7)
Neutrophils Relative %: 72 %
Platelets: 149 10*3/uL — ABNORMAL LOW (ref 150–400)
RBC: 5.96 MIL/uL — ABNORMAL HIGH (ref 4.22–5.81)
RDW: 14.8 % (ref 11.5–15.5)
WBC: 14.7 10*3/uL — ABNORMAL HIGH (ref 4.0–10.5)
nRBC: 0 % (ref 0.0–0.2)

## 2018-12-06 LAB — URINALYSIS, ROUTINE W REFLEX MICROSCOPIC
Bilirubin Urine: NEGATIVE
Glucose, UA: NEGATIVE mg/dL
KETONES UR: NEGATIVE mg/dL
Nitrite: NEGATIVE
PROTEIN: NEGATIVE mg/dL
Specific Gravity, Urine: 1.014 (ref 1.005–1.030)
pH: 6 (ref 5.0–8.0)

## 2018-12-06 LAB — PROTIME-INR
INR: 1
Prothrombin Time: 13.1 seconds (ref 11.4–15.2)

## 2018-12-06 LAB — LACTIC ACID, PLASMA: Lactic Acid, Venous: 1.8 mmol/L (ref 0.5–1.9)

## 2018-12-06 MED ORDER — SODIUM CHLORIDE (PF) 0.9 % IJ SOLN
INTRAMUSCULAR | Status: AC
  Filled 2018-12-06: qty 50

## 2018-12-06 MED ORDER — SODIUM CHLORIDE 0.9 % IV SOLN
1.0000 g | Freq: Once | INTRAVENOUS | Status: AC
  Administered 2018-12-06: 1 g via INTRAVENOUS
  Filled 2018-12-06: qty 10

## 2018-12-06 MED ORDER — IOPAMIDOL (ISOVUE-300) INJECTION 61%
INTRAVENOUS | Status: AC
  Filled 2018-12-06: qty 100

## 2018-12-06 MED ORDER — SODIUM CHLORIDE 0.9% FLUSH
3.0000 mL | Freq: Once | INTRAVENOUS | Status: AC
  Administered 2018-12-06: 3 mL via INTRAVENOUS

## 2018-12-06 MED ORDER — IBUPROFEN 800 MG PO TABS
800.0000 mg | ORAL_TABLET | Freq: Once | ORAL | Status: AC
  Administered 2018-12-06: 800 mg via ORAL
  Filled 2018-12-06: qty 1

## 2018-12-06 MED ORDER — SODIUM CHLORIDE 0.9 % IV BOLUS
1000.0000 mL | Freq: Once | INTRAVENOUS | Status: AC
  Administered 2018-12-06: 1000 mL via INTRAVENOUS

## 2018-12-06 MED ORDER — IOPAMIDOL (ISOVUE-300) INJECTION 61%
100.0000 mL | Freq: Once | INTRAVENOUS | Status: AC | PRN
  Administered 2018-12-06: 100 mL via INTRAVENOUS

## 2018-12-06 NOTE — ED Notes (Signed)
Bed: WA02 Expected date:  Expected time:  Means of arrival:  Comments: [place]

## 2018-12-06 NOTE — ED Triage Notes (Signed)
Patient reports fever and body aches since last night. Reports he had a contrast procedure related to urostomy yesterday. Denies N/V/D.

## 2018-12-06 NOTE — ED Provider Notes (Signed)
[facility] DEPT Provider Note   CSN: [id number] Arrival date & time: 12/06/18  2009     History   Chief Complaint Chief Complaint  Patient presents with  . Generalized Body Aches  . Fever  . Abdominal Pain    HPI Kristopher Thompson is a 68 y.o. male.  The history is provided by the patient and medical records. No language interpreter was used.  Fever  Abdominal Pain  Associated symptoms: fever      68 year old male with history of prostate cancer, bladder cancer, has urostomy bag presenting with abdominal pain.  Patient report he had a contrast with seizure related to his urostomy bag yesterday.  Afterward he developed fever, chills, body aches as well as abdominal pain.  The symptom has been persistent and moderate in severity.  No significant headache, URI symptoms, chest pain, shortness of breath, nausea vomiting diarrhea or bowel changes.  No rash.  Patient regularly have his urostomy tube exchange monthly.  His urologist is Dr. [name].  Past Medical History:  Diagnosis Date  . At risk for sleep apnea    12-25-2017   STOP-BANG SCORE= 5   --- SENT TO PCP  . 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 Colostomy in place [facility])    since 08-08-2017-- per pt 12-25-2017 reddness around stoma  . GERD (gastroesophageal reflux disease)   . H/O hiatal hernia   . History of sepsis 09/2017   dx bacteremia due to klebsiella pneumoniae,  post op intraabdominal abscess  . Prostate cancer [facility]) urologist-- dr [name]   10-02-2012 s/p  prostatectomy-- Stage T1c  . RBBB   . Renal disorder    pt. denies  . Wears glasses     Patient Active Problem List   Diagnosis Date Noted  . Pyelonephritis 06/15/2018  . Leukocytosis 06/15/2018  . Hyperkalemia 06/15/2018  . AKI (acute kidney injury) ([place]) 06/15/2018  . 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 Candida infection    . Postoperative intra-abdominal abscess   . Abscess   . Fever 10/09/2017  . Bacteremia due to Klebsiella pneumoniae 10/09/2017  . Urine leakage from surgical incision   . Sepsis ([place]) 10/04/2017  . GERD (gastroesophageal reflux disease) 08/11/2017  . Obesity (BMI 30-39.9) 08/11/2017  . 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 08/08/2017 08/08/2017    Past Surgical History:  Procedure Laterality Date  . ABDOMINAL SURGERY    . APPENDECTOMY  1972  . CHOLECYSTECTOMY  1985  . COLOSTOMY REVERSAL N/A 01/08/2018   Procedure: COLOSTOMY REVERSAL;  Surgeon: Leighton Ruff, MD;  Location: [facility];  Service: General;  Laterality: N/A;  . CYSTOSCOPY WITH RETROGRADE PYELOGRAM, URETEROSCOPY AND STENT PLACEMENT Right 06/10/2017   Procedure: CYSTOSCOPY WITH RIGHT URETEROSCOPY WITH RIGHT STENT PLACEMENT;  Surgeon: Raynelle Bring, MD;  Location: [facility];  Service: Urology;  Laterality: Right;  . EUS N/A 04/16/2018   Procedure: FULL UPPER ENDOSCOPIC ULTRASOUND (EUS) RADIAL;  Surgeon: Arta Silence, MD;  Location: [facility];  Service: Endoscopy;  Laterality: N/A;  . FLEXIBLE SIGMOIDOSCOPY N/A 12/13/2017   Procedure: FLEXIBLE SIGMOIDOSCOPY;  Surgeon: Leighton Ruff, MD;  Location: [facility];  Service: Endoscopy;  Laterality: N/A;  . ILEO CONDUIT    . IR CATHETER TUBE CHANGE  12/13/2017  . IR CATHETER TUBE CHANGE  01/17/2018  .  IR CATHETER TUBE CHANGE  02/28/2018  . IR CATHETER TUBE CHANGE  07/18/2018  . IR CATHETER TUBE CHANGE  08/22/2018  . IR CATHETER TUBE CHANGE  10/31/2018  . IR CONVERT LEFT NEPHROSTOMY TO NEPHROURETERAL CATH  10/24/2017  . IR EXT NEPHROURETERAL CATH EXCHANGE  05/19/2018  . IR EXT NEPHROURETERAL CATH EXCHANGE  06/16/2018  . IR NEPHRO TUBE REMOV/FL  10/24/2017  . IR NEPHROSTOGRAM LEFT THRU EXISTING ACCESS  12/05/2018  . IR NEPHROSTOMY PLACEMENT LEFT  10/07/2017  . IR NEPHROSTOMY  TUBE CHANGE  04/11/2018  . ROBOT ASSISTED LAPAROSCOPIC RADICAL PROSTATECTOMY  10/02/2012   Procedure: ROBOTIC ASSISTED LAPAROSCOPIC RADICAL PROSTATECTOMY LEVEL 2;  Surgeon: Dutch Gray, MD;  Location: [facility];  Service: Urology;  Laterality: N/A;  . ROBOTIC ASSISTED LAPAROSCOPIC BLADDER DIVERTICULECTOMY N/A 08/08/2017   Procedure: XI ROBOTIC ASSISTED LAPAROSCOPIC RADICAL CYSTECTOMY COVERTED TO OPEN PELVIC LYMPHADNECTOMY BILATERAL AND ILEAL CONDUIT URINARY DIVERSION;  Surgeon: Raynelle Bring, MD;  Location: [facility];  Service: Urology;  Laterality: N/A;  . TRANSURETHRAL RESECTION OF BLADDER TUMOR  06/10/2017   Procedure: TRANSURETHRAL RESECTION OF BLADDER TUMOR (TURBT);  Surgeon: Raynelle Bring, MD;  Location: [facility];  Service: Urology;;  . XI ROBOT ABDOMINAL PERINEAL RESECTION N/A 08/08/2017   Procedure: REPAIR OF RECTAL TEAR POSSIBLE PARTIAL PROCTECTOMY, CREATION OF  OSTOMY;  Surgeon: Leighton Ruff, MD;  Location: [facility];  Service: General;  Laterality: N/A;        Home Medications    Prior to Admission medications   Medication Sig Start Date End Date Taking? Authorizing Provider  acetaminophen (TYLENOL) 500 MG tablet Take 1,000 mg by mouth every 8 (eight) hours as needed for mild pain or headache.     [provider]  bisacodyl (DULCOLAX) 5 MG EC tablet Take 10 mg by mouth daily as needed for moderate constipation.    [provider]  Cholecalciferol (VITAMIN D PO) Take 1 capsule by mouth daily.    [provider]  ciprofloxacin (CIPRO) 500 MG tablet Take 500 mg by mouth 2 (two) times daily. 3 day course    [provider]  Cyanocobalamin (VITAMIN B-12 CR PO) Take 1 tablet by mouth daily.    [provider]  D-MANNOSE PO Take 1 tablet by mouth daily.    [provider]  esomeprazole (NEXIUM) 20 MG capsule Take 20 mg by mouth every 3 (three) days.     [provider]  ibuprofen (ADVIL,MOTRIN) 200 MG tablet Take 600 mg by mouth daily as  needed for headache or moderate pain.     [provider]  KRILL OIL PO Take 1 capsule by mouth daily.    [provider]  levofloxacin (LEVAQUIN) 500 MG tablet Take 500 mg by mouth daily.    [provider]  Multiple Vitamin (MULTIVITAMIN WITH MINERALS) TABS tablet Take 1 tablet by mouth daily.    [provider]  senna (SENOKOT) 8.6 MG TABS tablet Take 1 tablet by mouth daily as needed for mild constipation.    [provider]    Family History Family History  Problem Relation Age of Onset  . Lung cancer Mother   . Hypertension Father   . Colon cancer Other   . CAD Neg Hx   . Diabetes Neg Hx   . Stroke Neg Hx     Social History Social History   Tobacco Use  . Smoking status: Former Smoker    Years: 9.00    Types: Cigarettes    Last  attempt to quit: 12/25/1979    Years since quitting: 38.9  . Smokeless tobacco: Never Used  Substance Use Topics  . Alcohol use: Yes    Comment: occasional  . Drug use: No     Allergies   Demerol [meperidine]   Review of Systems Review of Systems  Constitutional: Positive for fever.  Gastrointestinal: Positive for abdominal pain.  All other systems reviewed and are negative.    Physical Exam Updated Vital Signs BP (!) 144/94 (BP Location: Left Arm)   Pulse (!) 115   Temp (!) 100.8 F (38.2 C) (Oral)   Resp 20   Ht 6' (1.829 m)   Wt 113.4 kg   SpO2 98%   BMI 33.91 kg/m   Physical Exam Vitals signs and nursing note reviewed.  Constitutional:      General: He is not in acute distress.    Appearance: He is well-developed.  HENT:     Head: Atraumatic.  Eyes:     Conjunctiva/sclera: Conjunctivae normal.  Neck:     Musculoskeletal: Neck supple.  Cardiovascular:     Rate and Rhythm: Tachycardia present.     Heart sounds: Normal heart sounds. No murmur. No friction rub. No gallop.   Pulmonary:     Effort: Pulmonary effort is normal.     Breath sounds: Normal breath sounds. No  wheezing, rhonchi or rales.  Abdominal:     General: Abdomen is flat.     Palpations: Abdomen is soft.     Tenderness: There is generalized abdominal tenderness (Diffuse tenderness without guarding or rebound tenderness.  Right urostomy bag in place with normal stoma and no signs of skin infection.).  Skin:    Findings: No rash.  Neurological:     Mental Status: He is alert and oriented to person, place, and time.      ED Treatments / Results  Labs (all labs ordered are listed, but only abnormal results are displayed) Labs Reviewed  COMPREHENSIVE METABOLIC PANEL - Abnormal; Notable for the following components:      Result Value   Glucose, Bld 109 (*)    All other components within normal limits  CBC WITH DIFFERENTIAL/PLATELET - Abnormal; Notable for the following components:   WBC 14.7 (*)    RBC 5.96 (*)    Platelets 149 (*)    Neutro Abs 10.7 (*)    Monocytes Absolute 2.0 (*)    Abs Immature Granulocytes 0.08 (*)    All other components within normal limits  URINALYSIS, ROUTINE W REFLEX MICROSCOPIC - Abnormal; Notable for the following components:   APPearance HAZY (*)    Hgb urine dipstick MODERATE (*)    Leukocytes, UA LARGE (*)    WBC, UA >50 (*)    Bacteria, UA RARE (*)    All other components within normal limits  CULTURE, BLOOD (ROUTINE X 2)  CULTURE, BLOOD (ROUTINE X 2)  URINE CULTURE  LACTIC ACID, PLASMA  PROTIME-INR    EKG None  Radiology Dg Chest 2 View  Result Date: 12/06/2018 CLINICAL DATA:  Fevers EXAM: CHEST - 2 VIEW COMPARISON:  08/29/2018 FINDINGS: Cardiac shadow is within normal limits. Lungs are well aerated bilaterally without focal infiltrate or sizable effusion. No acute bony abnormality is noted. IMPRESSION: No active cardiopulmonary disease. Electronically Signed   By: Inez Catalina M.D.   On: 12/06/2018 20:52   Ir Nephrostogram Left Thru Existing Access  Result Date: 12/06/2018 INDICATION: History of cystectomy and chronic indwelling left  nephroureteral catheter via ileostomy  and ileal conduit for urine leak. Assessment is made today to determine whether the catheter is needed further given the length of time that it has been present. EXAM: NEPHROSTOGRAM THROUGH EXISTING ACCESS UNDER FLUOROSCOPY COMPARISON:  None. MEDICATIONS: None ANESTHESIA/SEDATION: None CONTRAST:  30 mL Omnipaque 300-administered into the collecting system and ureter FLUOROSCOPY TIME:  Fluoroscopy Time: 3 minutes and 24 seconds. 181.0 mGy. COMPLICATIONS: None immediate. PROCEDURE: Informed written consent was obtained from the patient after a thorough discussion of the procedural risks, benefits and alternatives. All questions were addressed. Maximal Sterile Barrier Technique was utilized including caps, mask, sterile gowns, sterile gloves, sterile drape, hand hygiene and skin antiseptic. A timeout was performed prior to the initiation of the procedure. The pre-existing nephroureteral catheter was injected with contrast material. It was then cut and removed over a guidewire. A 7 French sheath was advanced into the left ureter. Contrast injection was performed at various levels of the left ureter through the sheath and over the guidewire in order to evaluate ureteral patency. The sheath was further retracted into the ileal conduit. Additional contrast injection was performed. A 5 French catheter was also advanced into the ureter and additional contrast injection performed to further evaluate distal ureteral patency. On completion of the procedure, guidewire and sheath were removed. FINDINGS: Nephrostogram demonstrates normal patency of the left ureter without evidence of significant stricture or contrast extravasation. Contrast flows normally into the ileal conduit. No filling defects identified. Due to findings, decision was made to completely remove the nephroureteral catheter permanently. IMPRESSION: Nephrostogram and detailed contrast study of the left ureter demonstrates no  evidence of significant ureteral stricture or contrast extravasation. Normal flow of contrast is noted into the ileal conduit. Retrograde nephroureteral catheter access was therefore completely removed. Electronically Signed   By: Aletta Edouard M.D.   On: 12/06/2018 09:44    Procedures .Critical Care Performed by: Domenic Moras, PA-C Authorized by: Domenic Moras, PA-C   Critical care provider statement:    Critical care time (minutes):  45   Critical care was time spent personally by me on the following activities:  Discussions with consultants, evaluation of patient's response to treatment, examination of patient, ordering and performing treatments and interventions, ordering and review of laboratory studies, ordering and review of radiographic studies, pulse oximetry, re-evaluation of patient's condition, obtaining history from patient or surrogate and review of old charts   (including critical care time)  Medications Ordered in ED Medications  sodium chloride flush (NS) 0.9 % injection 3 mL (has no administration in time range)     Initial Impression / Assessment and Plan / ED Course  I have reviewed the triage vital signs and the nursing notes.  Pertinent labs & imaging results that were available during my care of the patient were reviewed by me and considered in my medical decision making (see chart for details).     BP 116/73 (BP Location: Right Arm)   Pulse 87   Temp (!) 100.8 F (38.2 C) (Oral)   Resp 20   Ht 6' (1.829 m)   Wt 113.4 kg   SpO2 97%   BMI 33.91 kg/m    Final Clinical Impressions(s) / ED Diagnoses   Final diagnoses:  Lower urinary tract infectious disease  Bacteremia    ED Discharge Orders    None     9:54 PM Patient who underwent an interventional radiology procedure involving his urostomy bag yesterday now came in with symptoms concerning for sepsis.  He endorsed abdominal pain  with fever chills.  He is febrile with initial temperature of  100.8, tachycardic with heart rate of 115 however blood pressure without evidence of hypotension.  Normal lactic acid.  Will obtain labs, abdominal pelvis CT scan and will initiate antibiotic.  12:24 AM UA shows signs of urinary tract infection, CT scan shows evidence of hydronephrosis and findings suggestive of UTI.  Patient given Rocephin and cefepime.  Urologist, Dr.Wood was notified of patient, I have consulted triad hospitalist, Dr. Roel Cluck who will admit patient for further management.  Patient is not hypotensive.  12:50 AM Appreciate consultation from oncall urologist Dr. Clydene Laming who recommend consulting IR if pt's sxs worsen as pt may need further intervention such as nephrostomy tube.   Sepsis - Repeat Assessment  Performed at:    12:32  Vitals     Blood pressure 116/73, pulse 87, temperature (!) 100.8 F (38.2 C), temperature source Oral, resp. rate 20, height 6' (1.829 m), weight 113.4 kg, SpO2 97 %.  Sepsis reassessment done.    Domenic Moras, PA-C 12/07/18 [id number]    Davonna Belling, MD 12/07/18 2255

## 2018-12-07 ENCOUNTER — Other Ambulatory Visit: Payer: Self-pay

## 2018-12-07 ENCOUNTER — Encounter (HOSPITAL_COMMUNITY): Payer: Self-pay

## 2018-12-07 DIAGNOSIS — N12 Tubulo-interstitial nephritis, not specified as acute or chronic: Secondary | ICD-10-CM

## 2018-12-07 DIAGNOSIS — K219 Gastro-esophageal reflux disease without esophagitis: Secondary | ICD-10-CM | POA: Diagnosis not present

## 2018-12-07 DIAGNOSIS — C679 Malignant neoplasm of bladder, unspecified: Secondary | ICD-10-CM

## 2018-12-07 DIAGNOSIS — N1330 Unspecified hydronephrosis: Secondary | ICD-10-CM | POA: Diagnosis present

## 2018-12-07 DIAGNOSIS — R9431 Abnormal electrocardiogram [ECG] [EKG]: Secondary | ICD-10-CM | POA: Diagnosis not present

## 2018-12-07 DIAGNOSIS — N390 Urinary tract infection, site not specified: Secondary | ICD-10-CM

## 2018-12-07 DIAGNOSIS — R509 Fever, unspecified: Secondary | ICD-10-CM | POA: Diagnosis not present

## 2018-12-07 DIAGNOSIS — A419 Sepsis, unspecified organism: Secondary | ICD-10-CM | POA: Diagnosis not present

## 2018-12-07 LAB — COMPREHENSIVE METABOLIC PANEL
ALT: 12 U/L (ref 0–44)
AST: 11 U/L — ABNORMAL LOW (ref 15–41)
Albumin: 3.3 g/dL — ABNORMAL LOW (ref 3.5–5.0)
Alkaline Phosphatase: 39 U/L (ref 38–126)
Anion gap: 5 (ref 5–15)
BUN: 16 mg/dL (ref 8–23)
CALCIUM: 7.9 mg/dL — AB (ref 8.9–10.3)
CO2: 21 mmol/L — ABNORMAL LOW (ref 22–32)
CREATININE: 0.92 mg/dL (ref 0.61–1.24)
Chloride: 115 mmol/L — ABNORMAL HIGH (ref 98–111)
GFR calc Af Amer: >60 mL/min (ref >60)
GFR calc non Af Amer: >60 mL/min (ref >60)
Glucose, Bld: 105 mg/dL — ABNORMAL HIGH (ref 70–99)
Potassium: 3.7 mmol/L (ref 3.5–5.1)
Sodium: 141 mmol/L (ref 135–145)
Total Bilirubin: 0.6 mg/dL (ref 0.3–1.2)
Total Protein: 6.1 g/dL — ABNORMAL LOW (ref 6.5–8.1)

## 2018-12-07 LAB — CBC
HCT: 43.6 % (ref 39.0–52.0)
Hemoglobin: 13.6 g/dL (ref 13.0–17.0)
MCH: 26.3 pg (ref 26.0–34.0)
MCHC: 31.2 g/dL (ref 30.0–36.0)
MCV: 84.3 fL (ref 80.0–100.0)
Platelets: 151 10*3/uL (ref 150–400)
RBC: 5.17 MIL/uL (ref 4.22–5.81)
RDW: 14.8 % (ref 11.5–15.5)
WBC: 9.6 10*3/uL (ref 4.0–10.5)
nRBC: 0 % (ref 0.0–0.2)

## 2018-12-07 LAB — TSH: TSH: 0.385 u[IU]/mL (ref 0.350–4.500)

## 2018-12-07 LAB — LACTIC ACID, PLASMA
LACTIC ACID, VENOUS: 0.8 mmol/L (ref 0.5–1.9)
Lactic Acid, Venous: 0.8 mmol/L (ref 0.5–1.9)

## 2018-12-07 LAB — MAGNESIUM: Magnesium: 2.2 mg/dL (ref 1.7–2.4)

## 2018-12-07 LAB — PHOSPHORUS: Phosphorus: 2.9 mg/dL (ref 2.5–4.6)

## 2018-12-07 LAB — HIV ANTIBODY (ROUTINE TESTING W REFLEX): HIV Screen 4th Generation wRfx: NONREACTIVE

## 2018-12-07 LAB — PROCALCITONIN: Procalcitonin: <0.1 ng/mL

## 2018-12-07 MED ORDER — ACETAMINOPHEN 325 MG PO TABS
650.0000 mg | ORAL_TABLET | Freq: Four times a day (QID) | ORAL | Status: DC | PRN
  Administered 2018-12-07: 650 mg via ORAL
  Filled 2018-12-07: qty 2

## 2018-12-07 MED ORDER — SENNA 8.6 MG PO TABS: 1.0000 | ORAL_TABLET | Freq: Every day | ORAL | Status: DC | PRN

## 2018-12-07 MED ORDER — SODIUM CHLORIDE 0.9 % IV SOLN: INTRAVENOUS | Status: DC

## 2018-12-07 MED ORDER — SODIUM CHLORIDE 0.9 % IV SOLN
INTRAVENOUS | Status: DC
  Administered 2018-12-07: 02:00:00 via INTRAVENOUS

## 2018-12-07 MED ORDER — PANTOPRAZOLE SODIUM 40 MG PO TBEC
40.0000 mg | DELAYED_RELEASE_TABLET | Freq: Every day | ORAL | Status: DC
  Administered 2018-12-07 – 2018-12-09 (×3): 40 mg via ORAL
  Filled 2018-12-07 (×3): qty 1

## 2018-12-07 MED ORDER — PNEUMOCOCCAL VAC POLYVALENT 25 MCG/0.5ML IJ INJ
0.5000 mL | INJECTION | INTRAMUSCULAR | Status: DC
  Filled 2018-12-07: qty 0.5

## 2018-12-07 MED ORDER — BISACODYL 5 MG PO TBEC: 10.0000 mg | DELAYED_RELEASE_TABLET | Freq: Every day | ORAL | Status: DC | PRN

## 2018-12-07 MED ORDER — HYDROCODONE-ACETAMINOPHEN 5-325 MG PO TABS: 1.0000 | ORAL_TABLET | ORAL | Status: DC | PRN

## 2018-12-07 MED ORDER — ACETAMINOPHEN 650 MG RE SUPP: 650.0000 mg | Freq: Four times a day (QID) | RECTAL | Status: DC | PRN

## 2018-12-07 MED ORDER — SODIUM CHLORIDE 0.9 % IV SOLN
1.0000 g | Freq: Three times a day (TID) | INTRAVENOUS | Status: DC
  Administered 2018-12-07 – 2018-12-09 (×7): 1 g via INTRAVENOUS
  Filled 2018-12-07 (×8): qty 1

## 2018-12-07 MED ORDER — SODIUM CHLORIDE 0.9 % IV BOLUS
1000.0000 mL | Freq: Once | INTRAVENOUS | Status: AC
  Administered 2018-12-07: 1000 mL via INTRAVENOUS

## 2018-12-07 MED ORDER — SODIUM CHLORIDE 0.9 % IV SOLN
2.0000 g | Freq: Once | INTRAVENOUS | Status: AC
  Administered 2018-12-07: 2 g via INTRAVENOUS
  Filled 2018-12-07: qty 2

## 2018-12-07 NOTE — ED Notes (Signed)
Unable to obtain 2nd set of ordered [facility] w/o delayed abx.

## 2018-12-07 NOTE — Progress Notes (Signed)
I have seen and assessed patient and agree with Dr. Rueben Bash assessment and plan.  Patient is 68 year old gentleman history of prostate cancer, bladder cancer with left ureteral obstruction, history of rectal tear requiring colostomy and then colostomy reversal who underwent left nephrostogram nephroureteral stent removal 12/05/2018 presenting to the ED with subjective fevers malaise.  CT abdomen and pelvis done showed moderate left hydroureteronephrosis and perinephric edema/urethral inflammation.  Patient admitted for probable acute pyelonephritis/UTI in the setting of recent instrumentation.  Urine cultures pending.  Blood cultures pending.  Continue empiric IV cefepime.  Urology consulted.  No charge.

## 2018-12-07 NOTE — Progress Notes (Signed)
A consult was received from an ED physician for cefepime per pharmacy dosing.  The patient's profile has been reviewed for ht/wt/allergies/indication/available labs.   A one time order has been placed for Cefepime 2 Gm.  Further antibiotics/pharmacy consults should be ordered by admitting physician if indicated.                       Thank you, Dorrene German 12/07/2018  12:33 AM

## 2018-12-07 NOTE — Progress Notes (Signed)
Pharmacy Antibiotic Note  Kristopher Thompson is a 68 y.o. male admitted on 12/06/2018 with UTI. Patient s/p urological procedure on 1/24 Pharmacy has been consulted for cefepime dosing.  Plan: Cefepime 2 Gm x1 then 1 Gm IV q8h F/u scr/cultures  Height: 6' (182.9 cm) Weight: 263 lb 0.1 oz (119.3 kg) IBW/kg (Calculated) : 77.6  Temp (24hrs), Avg:99.9 F (37.7 C), Min:98.9 F (37.2 C), Max:100.8 F (38.2 C)  Recent Labs  Lab 12/06/18 2044  WBC 14.7*  CREATININE 1.00  LATICACIDVEN 1.8    Estimated Creatinine Clearance: 96.9 mL/min (by C-G formula based on SCr of 1 mg/dL).    Allergies  Allergen Reactions  . Demerol [Meperidine] Nausea And Vomiting and Other (See Comments)    SEVERE NAUSEA    Antimicrobials this admission: 1/25 rocephin >> x1 ED 1/26 cefepime >>   Dose adjustments this admission:   Microbiology results:  BCx:   UCx:    Sputum:    MRSA PCR:  Thank you for allowing pharmacy to be a part of this patient's care.  Dorrene German 12/07/2018 1:39 AM

## 2018-12-07 NOTE — Consult Note (Signed)
Urology Consult Note   Requesting Attending Physician:  Eugenie Filler, MD Service Providing Consult: Urology  Consulting Attending: Festus Aloe, MD   Reason for Consult:  hydronephrosis  HPI: Kristopher Thompson is seen in consultation for reasons noted above at the request of Eugenie Filler, MD  This is a 68 y.o. male with:  1) Prostate cancer: he is s/p a BNS RAL radical prostatectomy on 10/02/12 for pT2cNxMx, Gleason 3+4 adenocarcinoma. His PSA has been undetectable since surgery.  2) Bladder cancer: he is s/p open RC/IC and BPLND with diverting colostomy due to rectal repair and ileal conduit diversion on 08/08/17. 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 His course was complicated by delayed urine leak from left ureteral anastomosis (PCN and abdominal drain placed). In 10/2017, he had a nephrostogram with resolved leak and placement of nephroureteral catheter, which has since been serially exchanged by IR. His colostomy was reversed in 12/2017.  Patient underwent left nephrostogram nephroureteral stent removal on 1/24. He presented 1/25 with subjective fevers/malaise. CT scan revealed moderate left hydroureteronephrosis and perinephric edema/ureteral inflammation. Urology consulted for assistance with management where conservative management with fluids and IV antibiotics was recommended.  Today, patient reports subjective improvement in symptoms following IV antibiotics. No flank/abdominal pain.   Past Medical History: Past Medical History:  Diagnosis Date  . At risk for sleep apnea    12-25-2017   STOP-BANG SCORE= 5   --- SENT TO PCP  . Bladder cancer [facility]) dx 07/2017   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  s/p  cystectomy w/ ileal conduit urinary diversion  . Colostomy in place [facility])    since 08-08-2017-- per pt 12-25-2017 reddness around stoma  . GERD (gastroesophageal reflux disease)   . H/O hiatal hernia   . History of sepsis 09/2017   dx  bacteremia due to klebsiella pneumoniae,  post op intraabdominal abscess  . Prostate cancer [facility]) urologist-- dr [name]   10-02-2012 s/p  prostatectomy-- Stage T1c  . RBBB   . Renal disorder    pt. denies  . Wears glasses     Past Surgical History:  Past Surgical History:  Procedure Laterality Date  . ABDOMINAL SURGERY    . APPENDECTOMY  1972  . CHOLECYSTECTOMY  1985  . COLOSTOMY REVERSAL N/A 01/08/2018   Procedure: COLOSTOMY REVERSAL;  Surgeon: Leighton Ruff, MD;  Location: [facility];  Service: General;  Laterality: N/A;  . CYSTOSCOPY WITH RETROGRADE PYELOGRAM, URETEROSCOPY AND STENT PLACEMENT Right 06/10/2017   Procedure: CYSTOSCOPY WITH RIGHT URETEROSCOPY WITH RIGHT STENT PLACEMENT;  Surgeon: Raynelle Bring, MD;  Location: [facility];  Service: Urology;  Laterality: Right;  . EUS N/A 04/16/2018   Procedure: FULL UPPER ENDOSCOPIC ULTRASOUND (EUS) RADIAL;  Surgeon: Arta Silence, MD;  Location: [facility];  Service: Endoscopy;  Laterality: N/A;  . FLEXIBLE SIGMOIDOSCOPY N/A 12/13/2017   Procedure: FLEXIBLE SIGMOIDOSCOPY;  Surgeon: Leighton Ruff, MD;  Location: [facility];  Service: Endoscopy;  Laterality: N/A;  . ILEO CONDUIT    . IR CATHETER TUBE CHANGE  12/13/2017  . IR CATHETER TUBE CHANGE  01/17/2018  . IR CATHETER TUBE CHANGE  02/28/2018  . IR CATHETER TUBE CHANGE  07/18/2018  . IR CATHETER TUBE CHANGE  08/22/2018  . IR CATHETER TUBE CHANGE  10/31/2018  . IR CONVERT LEFT NEPHROSTOMY TO NEPHROURETERAL CATH  10/24/2017  . IR EXT NEPHROURETERAL CATH EXCHANGE  05/19/2018  . IR EXT NEPHROURETERAL CATH EXCHANGE  06/16/2018  . IR NEPHRO TUBE REMOV/FL  10/24/2017  .  IR NEPHROSTOGRAM LEFT THRU EXISTING ACCESS  12/05/2018  . IR NEPHROSTOMY PLACEMENT LEFT  10/07/2017  . IR NEPHROSTOMY TUBE CHANGE  04/11/2018  . ROBOT ASSISTED LAPAROSCOPIC RADICAL PROSTATECTOMY  10/02/2012   Procedure: ROBOTIC ASSISTED LAPAROSCOPIC RADICAL PROSTATECTOMY LEVEL 2;  Surgeon: Dutch Gray, MD;  Location: [facility];  Service:  Urology;  Laterality: N/A;  . ROBOTIC ASSISTED LAPAROSCOPIC BLADDER DIVERTICULECTOMY N/A 08/08/2017   Procedure: XI ROBOTIC ASSISTED LAPAROSCOPIC RADICAL CYSTECTOMY COVERTED TO OPEN PELVIC LYMPHADNECTOMY BILATERAL AND ILEAL CONDUIT URINARY DIVERSION;  Surgeon: Raynelle Bring, MD;  Location: [facility];  Service: Urology;  Laterality: N/A;  . TRANSURETHRAL RESECTION OF BLADDER TUMOR  06/10/2017   Procedure: TRANSURETHRAL RESECTION OF BLADDER TUMOR (TURBT);  Surgeon: Raynelle Bring, MD;  Location: [facility];  Service: Urology;;  . XI ROBOT ABDOMINAL PERINEAL RESECTION N/A 08/08/2017   Procedure: REPAIR OF RECTAL TEAR POSSIBLE PARTIAL PROCTECTOMY, CREATION OF  OSTOMY;  Surgeon: Leighton Ruff, MD;  Location: [facility];  Service: General;  Laterality: N/A;    Medication: Current Facility-Administered Medications  Medication Dose Route Frequency Provider Last Rate Last Dose  . 0.9 %  sodium chloride infusion   Intravenous Continuous Eugenie Filler, MD      . acetaminophen (TYLENOL) tablet 650 mg  650 mg Oral Q6H PRN Toy Baker, MD       Or  . acetaminophen (TYLENOL) suppository 650 mg  650 mg Rectal Q6H PRN Doutova, Anastassia, MD      . bisacodyl (DULCOLAX) EC tablet 10 mg  10 mg Oral Daily PRN Doutova, Anastassia, MD      . ceFEPIme (MAXIPIME) 1 g in sodium chloride 0.9 % 100 mL IVPB  1 g Intravenous Q8H Dorrene German, RPH 200 mL/hr at 12/07/18 0829 1 g at 12/07/18 0829  . HYDROcodone-acetaminophen (NORCO/VICODIN) 5-325 MG per tablet 1-2 tablet  1-2 tablet Oral Q4H PRN Doutova, Anastassia, MD      . iopamidol (ISOVUE-300) 61 % injection           . pantoprazole (PROTONIX) EC tablet 40 mg  40 mg Oral Q0600 Eugenie Filler, MD      . Derrill Memo ON 12/08/2018] pneumococcal 23 valent vaccine (PNU-IMMUNE) injection 0.5 mL  0.5 mL Intramuscular Tomorrow-1000 Doutova, Anastassia, MD      . senna (SENOKOT) tablet 8.6 mg  1 tablet Oral Daily PRN Doutova, Anastassia, MD      . sodium chloride (PF) 0.9 %  injection             Allergies: Allergies  Allergen Reactions  . Demerol [Meperidine] Nausea And Vomiting and Other (See Comments)    SEVERE NAUSEA    Social History: Social History   Tobacco Use  . Smoking status: Former Smoker    Years: 9.00    Types: Cigarettes    Last attempt to quit: 12/25/1979    Years since quitting: 38.9  . Smokeless tobacco: Never Used  Substance Use Topics  . Alcohol use: Yes    Comment: occasional  . Drug use: No    Family History Family History  Problem Relation Age of Onset  . Lung cancer Mother   . Hypertension Father   . Colon cancer Other   . CAD Neg Hx   . Diabetes Neg Hx   . Stroke Neg Hx     Review of Systems 10 systems were reviewed and are negative except as noted specifically in the HPI.  Objective   Vital signs in last 24 hours: BP 131/78 (BP  Location: Right Arm)   Pulse 87   Temp 98.2 F (36.8 C) (Oral)   Resp 18   Ht 6' (1.829 m)   Wt 119.3 kg   SpO2 97%   BMI 35.67 kg/m   Physical Exam General: NAD, A&O, resting, appropriate HEENT: [place]/AT, EOMI, MMM Pulmonary: Normal work of breathing Cardiovascular: HDS, adequate peripheral perfusion Abdomen: Soft, NTTP, nondistended, well healed abdominal incisions, RLQ urostomy pink/patent and draining clear yellow urine,  GU: No CVA tenderness Extremities: warm and well perfused Neuro: Appropriate, no focal neurological deficits  Most Recent Labs: Lab Results  Component Value Date   WBC 9.6 12/07/2018   HGB 13.6 12/07/2018   HCT 43.6 12/07/2018   PLT 151 12/07/2018    Lab Results  Component Value Date   NA 141 12/07/2018   K 3.7 12/07/2018   CL 115 (H) 12/07/2018   CO2 21 (L) 12/07/2018   BUN 16 12/07/2018   CREATININE 0.92 12/07/2018   CALCIUM 7.9 (L) 12/07/2018   MG 2.2 12/07/2018   PHOS 2.9 12/07/2018    Lab Results  Component Value Date   INR 1.00 12/06/2018   APTT 33 03/02/2018     IMAGING: Dg Chest 2 View  Result Date:  12/06/2018 CLINICAL DATA:  Fevers EXAM: CHEST - 2 VIEW COMPARISON:  08/29/2018 FINDINGS: Cardiac shadow is within normal limits. Lungs are well aerated bilaterally without focal infiltrate or sizable effusion. No acute bony abnormality is noted. IMPRESSION: No active cardiopulmonary disease. Electronically Signed   By: Inez Catalina M.D.   On: 12/06/2018 20:52   Ct Abdomen Pelvis W Contrast  Result Date: 12/06/2018 CLINICAL DATA:  Recent urostomy procedure, sepsis. Abdominal pain. EXAM: CT ABDOMEN AND PELVIS WITH CONTRAST TECHNIQUE: Multidetector CT imaging of the abdomen and pelvis was performed using the standard protocol following bolus administration of intravenous contrast. CONTRAST:  162mL ISOVUE-300 IOPAMIDOL (ISOVUE-300) INJECTION 61% COMPARISON:  Nephrostogram through existing accessed 12/05/2018. Abdominal CT 08/29/2018 FINDINGS: Lower chest: Linear atelectasis in both lower lobes. No confluent airspace disease or pleural effusion. Hepatobiliary: No focal liver abnormality is seen. Status post cholecystectomy. No biliary dilatation. Pancreas: No ductal dilatation or inflammation. 11 mm cyst in the pancreatic tail is unchanged. Scattered parenchymal calcifications and pancreatic atrophy again seen. Spleen: Normal in size without focal abnormality. Adrenals/Urinary Tract: Normal adrenal glands. Moderate left hydroureteronephrosis and perinephric edema. Mild left ureteral enhancement and ureteral thickening. Ureteral thickening at the ureteral conduit anastomosis. Delayed excretion from the left kidney on delayed phase imaging. No discrete perinephric fluid collection. No right hydronephrosis. The right ureter is decompressed. Ileal conduit collapsed and unremarkable, post cystectomy. Urostomy in the right lower quadrant. Peristomal hernia containing small bowel at the ileal conduit. Stomach/Bowel: Small hiatal hernia. Stomach is nondistended. Small bowel at the enteric sutures site in the ileum is  prominent but no obstruction. Peristomal hernia adjacent to urostomy contains nonobstructed noninflamed small bowel. Umbilical hernia contains nonobstructed noninflamed small bowel. Richter type hernia in the left lower quadrant abdominal scar. No bowel inflammation. The appendix is not visualized, surgically absent per history. Moderate stool burden in the colon. Diverticulosis of the distal colon without diverticulitis. Vascular/Lymphatic: Aortic atherosclerosis without aneurysm. Multiple small retroperitoneal and retrocrural nodes, likely reactive. No pelvic adenopathy. Reproductive: Post prostatectomy. Other: Postsurgical change of the anterior abdominal wall with Richter type hernia involving the sigmoid colon in the left lower quadrant. Umbilical hernia contains nonobstructed noninflamed small bowel. Peristomal hernia as described. No ascites. No free air. Musculoskeletal: There are no acute  or suspicious osseous abnormalities. Prominent Schmorl's node superior endplate L1, unchanged from prior. IMPRESSION: 1. Moderate left hydroureteronephrosis and perinephric edema. Mild left ureteral enhancement and ureteral thickening at the renal pelvis, with additional left ureteral thickening at the ileal conduit anastomosis. Findings consistent with urinary tract infection. No stricture was seen on recent urostogram 2. Post cystectomy and ileal conduit . Peristomal hernia containing small bowel. 3. Umbilical and parastomal hernias contain nonobstructed noninflamed small bowel. Richter type hernia of the left lower abdominal wall contains noninflamed sigmoid colon. 4. Colonic diverticulosis without diverticulitis. Additional chronic findings as described. 5.  Aortic Atherosclerosis (ICD10-I70.0). Electronically Signed   By: Keith Rake M.D.   On: 12/06/2018 23:50   Ir Nephrostogram Left Thru Existing Access  Result Date: 12/06/2018 INDICATION: History of cystectomy and chronic indwelling left nephroureteral  catheter via ileostomy and ileal conduit for urine leak. Assessment is made today to determine whether the catheter is needed further given the length of time that it has been present. EXAM: NEPHROSTOGRAM THROUGH EXISTING ACCESS UNDER FLUOROSCOPY COMPARISON:  None. MEDICATIONS: None ANESTHESIA/SEDATION: None CONTRAST:  30 mL Omnipaque 300-administered into the collecting system and ureter FLUOROSCOPY TIME:  Fluoroscopy Time: 3 minutes and 24 seconds. 181.0 mGy. COMPLICATIONS: None immediate. PROCEDURE: Informed written consent was obtained from the patient after a thorough discussion of the procedural risks, benefits and alternatives. All questions were addressed. Maximal Sterile Barrier Technique was utilized including caps, mask, sterile gowns, sterile gloves, sterile drape, hand hygiene and skin antiseptic. A timeout was performed prior to the initiation of the procedure. The pre-existing nephroureteral catheter was injected with contrast material. It was then cut and removed over a guidewire. A 7 French sheath was advanced into the left ureter. Contrast injection was performed at various levels of the left ureter through the sheath and over the guidewire in order to evaluate ureteral patency. The sheath was further retracted into the ileal conduit. Additional contrast injection was performed. A 5 French catheter was also advanced into the ureter and additional contrast injection performed to further evaluate distal ureteral patency. On completion of the procedure, guidewire and sheath were removed. FINDINGS: Nephrostogram demonstrates normal patency of the left ureter without evidence of significant stricture or contrast extravasation. Contrast flows normally into the ileal conduit. No filling defects identified. Due to findings, decision was made to completely remove the nephroureteral catheter permanently. IMPRESSION: Nephrostogram and detailed contrast study of the left ureter demonstrates no evidence of  significant ureteral stricture or contrast extravasation. Normal flow of contrast is noted into the ileal conduit. Retrograde nephroureteral catheter access was therefore completely removed. Electronically Signed   By: Aletta Edouard M.D.   On: 12/06/2018 09:44    ------  Assessment:  68 y.o. male with h/o prostate cancer s/p RALP 09/2012 and MIBC s/p RC/IC 07/2017 with course c/b left ureteroenteric obstruction and history of urine leak who was admitted 1/25 with subjective fevers/malaise following left nephrostogram and removal of left nephroureteral stent on 1/24.  CT imaging in the ED revealed moderate left hydroureteronephrosis and perinephric edema/ureteral inflammation and overall clinical picture consistent with pyelonephritis in the setting of recent manipulation of urinary tract. He was started on IV antibiotics and admitted continued observation.  Patient remains AFVSS. No leukocytosis. UOP adequate. Cr WNL. Asymptomatic. U/BCx pending.  Recommendations: - Given that patient remains clinically stable with normal kidney function and without signs/symptoms of systemic infection or flank pain, there is indication for urologic intervention at this time. - Agree with continuation of empiric  antibiotic coverage pending culture results. - Urology will coordinate close outpatient follow up.  Thank you for this consult. Please contact the urology consult pager with any further questions/concerns.

## 2018-12-07 NOTE — ED Notes (Signed)
ED TO INPATIENT HANDOFF REPORT  Name/Age/Gender Kristopher Thompson 68 y.o. male  Code Status Code Status History    Date Active Date Inactive Code Status Order ID Comments User Context   06/15/2018 0404 06/17/2018 1324 Full Code [id number]  Norval Morton, MD ED   03/01/2018 2319 03/06/2018 1633 Full Code [id number]  Toy Baker, MD ED   01/08/2018 1909 01/10/2018 1317 Full Code [id number]  Leighton Ruff, MD Inpatient   10/04/2017 0247 10/14/2017 2017 Full Code [id number]  Oswald Hillock, MD Inpatient   08/08/2017 2053 08/14/2017 1445 Full Code [id number]  Raynelle Bring, MD Inpatient      Home/SNF/Other Home  Chief Complaint Post Op Pain/ Fever / Body Aches / Abdominal Pain / Generalized Weakeness   Level of Care/Admitting Diagnosis ED Disposition    ED Disposition Condition [facility] Area: [facility] [[id number]]  Level of Care: Telemetry [5]  Admit to tele based on following criteria: Other see comments  Comments: tachycardia  Diagnosis: Sepsis [facility]) [[id number]]  Admitting Physician: Toy Baker [[id number]]  Attending Physician: Toy Baker [[id number]]  PT Class (Do Not Modify): Observation [104]  PT Acc Code (Do Not Modify): Observation [10022]       Medical History Past Medical History:  Diagnosis Date  . At risk for sleep apnea    12-25-2017   STOP-BANG SCORE= 5   --- SENT TO PCP  . 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 Colostomy in place [facility])    since 08-08-2017-- per pt 12-25-2017 reddness around stoma  . GERD (gastroesophageal reflux disease)   . H/O hiatal hernia   . History of sepsis 09/2017   dx bacteremia due to klebsiella pneumoniae,  post op intraabdominal abscess  . Prostate cancer [facility]) urologist-- dr [name]   10-02-2012 s/p  prostatectomy-- Stage T1c  . RBBB   . Renal disorder    pt. denies  . Wears glasses      Allergies Allergies  Allergen Reactions  . Demerol [Meperidine] Nausea And Vomiting and Other (See Comments)    SEVERE NAUSEA    IV Location/Drains/Wounds Patient Lines/Drains/Airways Status   Active Line/Drains/Airways    Name:   Placement date:   Placement time:   Site:   Days:   Peripheral IV 12/06/18 Right;Posterior Hand   12/06/18    2306    Hand   1   Wound / Incision (Open or Dehisced) 10/04/17 Incision - Open Abdomen   10/04/17    1200    Abdomen   429          Labs/Imaging Results for orders placed or performed during the hospital encounter of 12/06/18 (from the past 48 hour(s))  Comprehensive metabolic panel     Status: Abnormal   Collection Time: 12/06/18  8:44 PM  Result Value Ref Range   Sodium 140 135 - 145 mmol/L   Potassium 3.7 3.5 - 5.1 mmol/L   Chloride 109 98 - 111 mmol/L   CO2 23 22 - 32 mmol/L   Glucose, Bld 109 (H) 70 - 99 mg/dL   BUN 20 8 - 23 mg/dL   Creatinine, Ser 1.00 0.61 - 1.24 mg/dL   Calcium 9.2 8.9 - 10.3 mg/dL   Total Protein 7.6 6.5 - 8.1 g/dL   Albumin 4.1 3.5 - 5.0 g/dL   AST 17 15 - 41 U/L   ALT 17 0 -  44 U/L   Alkaline Phosphatase 52 38 - 126 U/L   Total Bilirubin 0.6 0.3 - 1.2 mg/dL   GFR calc non Af Amer >60 >60 mL/min   GFR calc Af Amer >60 >60 mL/min   Anion gap 8 5 - 15    Comment: Performed at [facility], [place] [address], [place], [place] [address]  Lactic acid, plasma     Status: None   Collection Time: 12/06/18  8:44 PM  Result Value Ref Range   Lactic Acid, Venous 1.8 0.5 - 1.9 mmol/L    Comment: Performed at [facility] [place] [address], [place], [place] [address]  CBC with Differential     Status: Abnormal   Collection Time: 12/06/18  8:44 PM  Result Value Ref Range   WBC 14.7 (H) 4.0 - 10.5 K/uL   RBC 5.96 (H) 4.22 - 5.81 MIL/uL   Hemoglobin 16.0 13.0 - 17.0 g/dL   HCT 50.1 39.0 - 52.0 %   MCV 84.1 80.0 - 100.0 fL   MCH 26.8 26.0 - 34.0 pg   MCHC 31.9 30.0 - 36.0 g/dL    RDW 14.8 11.5 - 15.5 %   Platelets 149 (L) 150 - 400 K/uL   nRBC 0.0 0.0 - 0.2 %   Neutrophils Relative % 72 %   Neutro Abs 10.7 (H) 1.7 - 7.7 K/uL   Lymphocytes Relative 11 %   Lymphs Abs 1.6 0.7 - 4.0 K/uL   Monocytes Relative 14 %   Monocytes Absolute 2.0 (H) 0.1 - 1.0 K/uL   Eosinophils Relative 2 %   Eosinophils Absolute 0.3 0.0 - 0.5 K/uL   Basophils Relative 0 %   Basophils Absolute 0.1 0.0 - 0.1 K/uL   Immature Granulocytes 1 %   Abs Immature Granulocytes 0.08 (H) 0.00 - 0.07 K/uL    Comment: Performed at [facility], [place] [address], [place], [place] [address]  Protime-INR     Status: None   Collection Time: 12/06/18  8:44 PM  Result Value Ref Range   Prothrombin Time 13.1 11.4 - 15.2 seconds   INR 1.00     Comment: Performed at [facility], [place] [address], [place], [place] [address]  Urinalysis, Routine w reflex microscopic     Status: Abnormal   Collection Time: 12/06/18 10:37 PM  Result Value Ref Range   Color, Urine YELLOW YELLOW   APPearance HAZY (A) CLEAR   Specific Gravity, Urine 1.014 1.005 - 1.030   pH 6.0 5.0 - 8.0   Glucose, UA NEGATIVE NEGATIVE mg/dL   Hgb urine dipstick MODERATE (A) NEGATIVE   Bilirubin Urine NEGATIVE NEGATIVE   Ketones, ur NEGATIVE NEGATIVE mg/dL   Protein, ur NEGATIVE NEGATIVE mg/dL   Nitrite NEGATIVE NEGATIVE   Leukocytes, UA LARGE (A) NEGATIVE   RBC / HPF 11-20 0 - 5 RBC/hpf   WBC, UA >50 (H) 0 - 5 WBC/hpf   Bacteria, UA RARE (A) NONE SEEN   Mucus PRESENT     Comment: Performed at [facility], [place] [address] [place], [place] [address]   Dg Chest 2 View  Result Date: 12/06/2018 CLINICAL DATA:  Fevers EXAM: CHEST - 2 VIEW COMPARISON:  08/29/2018 FINDINGS: Cardiac shadow is within normal limits. Lungs are well aerated bilaterally without focal infiltrate or sizable effusion. No acute bony abnormality is noted. IMPRESSION: No active cardiopulmonary disease. Electronically  Signed   By: Inez Catalina M.D.   On: 12/06/2018 20:52  Ct Abdomen Pelvis W Contrast  Result Date: 12/06/2018 CLINICAL DATA:  Recent urostomy procedure, sepsis. Abdominal pain. EXAM: CT ABDOMEN AND PELVIS WITH CONTRAST TECHNIQUE: Multidetector CT imaging of the abdomen and pelvis was performed using the standard protocol following bolus administration of intravenous contrast. CONTRAST:  178mL ISOVUE-300 IOPAMIDOL (ISOVUE-300) INJECTION 61% COMPARISON:  Nephrostogram through existing accessed 12/05/2018. Abdominal CT 08/29/2018 FINDINGS: Lower chest: Linear atelectasis in both lower lobes. No confluent airspace disease or pleural effusion. Hepatobiliary: No focal liver abnormality is seen. Status post cholecystectomy. No biliary dilatation. Pancreas: No ductal dilatation or inflammation. 11 mm cyst in the pancreatic tail is unchanged. Scattered parenchymal calcifications and pancreatic atrophy again seen. Spleen: Normal in size without focal abnormality. Adrenals/Urinary Tract: Normal adrenal glands. Moderate left hydroureteronephrosis and perinephric edema. Mild left ureteral enhancement and ureteral thickening. Ureteral thickening at the ureteral conduit anastomosis. Delayed excretion from the left kidney on delayed phase imaging. No discrete perinephric fluid collection. No right hydronephrosis. The right ureter is decompressed. Ileal conduit collapsed and unremarkable, post cystectomy. Urostomy in the right lower quadrant. Peristomal hernia containing small bowel at the ileal conduit. Stomach/Bowel: Small hiatal hernia. Stomach is nondistended. Small bowel at the enteric sutures site in the ileum is prominent but no obstruction. Peristomal hernia adjacent to urostomy contains nonobstructed noninflamed small bowel. Umbilical hernia contains nonobstructed noninflamed small bowel. Richter type hernia in the left lower quadrant abdominal scar. No bowel inflammation. The appendix is not visualized, surgically  absent per history. Moderate stool burden in the colon. Diverticulosis of the distal colon without diverticulitis. Vascular/Lymphatic: Aortic atherosclerosis without aneurysm. Multiple small retroperitoneal and retrocrural nodes, likely reactive. No pelvic adenopathy. Reproductive: Post prostatectomy. Other: Postsurgical change of the anterior abdominal wall with Richter type hernia involving the sigmoid colon in the left lower quadrant. Umbilical hernia contains nonobstructed noninflamed small bowel. Peristomal hernia as described. No ascites. No free air. Musculoskeletal: There are no acute or suspicious osseous abnormalities. Prominent Schmorl's node superior endplate L1, unchanged from prior. IMPRESSION: 1. Moderate left hydroureteronephrosis and perinephric edema. Mild left ureteral enhancement and ureteral thickening at the renal pelvis, with additional left ureteral thickening at the ileal conduit anastomosis. Findings consistent with urinary tract infection. No stricture was seen on recent urostogram 2. Post cystectomy and ileal conduit . Peristomal hernia containing small bowel. 3. Umbilical and parastomal hernias contain nonobstructed noninflamed small bowel. Richter type hernia of the left lower abdominal wall contains noninflamed sigmoid colon. 4. Colonic diverticulosis without diverticulitis. Additional chronic findings as described. 5.  Aortic Atherosclerosis (ICD10-I70.0). Electronically Signed   By: Keith Rake M.D.   On: 12/06/2018 23:50   Ir Nephrostogram Left Thru Existing Access  Result Date: 12/06/2018 INDICATION: History of cystectomy and chronic indwelling left nephroureteral catheter via ileostomy and ileal conduit for urine leak. Assessment is made today to determine whether the catheter is needed further given the length of time that it has been present. EXAM: NEPHROSTOGRAM THROUGH EXISTING ACCESS UNDER FLUOROSCOPY COMPARISON:  None. MEDICATIONS: None ANESTHESIA/SEDATION: None  CONTRAST:  30 mL Omnipaque 300-administered into the collecting system and ureter FLUOROSCOPY TIME:  Fluoroscopy Time: 3 minutes and 24 seconds. 181.0 mGy. COMPLICATIONS: None immediate. PROCEDURE: Informed written consent was obtained from the patient after a thorough discussion of the procedural risks, benefits and alternatives. All questions were addressed. Maximal Sterile Barrier Technique was utilized including caps, mask, sterile gowns, sterile gloves, sterile drape, hand hygiene and skin antiseptic. A timeout was performed prior to the initiation of the procedure. The pre-existing nephroureteral  catheter was injected with contrast material. It was then cut and removed over a guidewire. A 7 French sheath was advanced into the left ureter. Contrast injection was performed at various levels of the left ureter through the sheath and over the guidewire in order to evaluate ureteral patency. The sheath was further retracted into the ileal conduit. Additional contrast injection was performed. A 5 French catheter was also advanced into the ureter and additional contrast injection performed to further evaluate distal ureteral patency. On completion of the procedure, guidewire and sheath were removed. FINDINGS: Nephrostogram demonstrates normal patency of the left ureter without evidence of significant stricture or contrast extravasation. Contrast flows normally into the ileal conduit. No filling defects identified. Due to findings, decision was made to completely remove the nephroureteral catheter permanently. IMPRESSION: Nephrostogram and detailed contrast study of the left ureter demonstrates no evidence of significant ureteral stricture or contrast extravasation. Normal flow of contrast is noted into the ileal conduit. Retrograde nephroureteral catheter access was therefore completely removed. Electronically Signed   By: Aletta Edouard M.D.   On: 12/06/2018 09:44   None  Pending Labs Unresulted Labs (From  admission, onward)    Start     Ordered   12/06/18 2142  Urine Culture  ONCE - STAT,   STAT     12/06/18 2142   12/06/18 2029  Culture, blood (Routine x 2)  BLOOD CULTURE X 2,   STAT     12/06/18 2028   Signed and Held  Lactic acid, plasma  STAT Now then every 3 hours,   STAT     Signed and Held   Signed and Held  Procalcitonin  Add-on,   R     Signed and Held   Signed and Held  HIV antibody (Routine Testing)  Tomorrow morning,   R     Signed and Held   Signed and Held  Magnesium  Tomorrow morning,   R    Comments:  Call MD if <1.5    Signed and Held   Signed and Held  Phosphorus  Tomorrow morning,   R     Signed and Held   Signed and Held  TSH  Once,   R    Comments:  Cancel if already done within 1 month and notify MD    Signed and Held   Signed and Held  Comprehensive metabolic panel  Once,   R    Comments:  Cal MD for K<3.5 or >5.0    Signed and Held   Signed and Held  CBC  Once,   R    Comments:  Call for hg <8.0    Signed and Held          Vitals/Pain Today's Vitals   12/06/18 2027 12/06/18 2247 12/07/18 0019  BP: (!) 144/94 118/67 116/73  Pulse: (!) 115 (!) 105 87  Resp: 20 18 20   Temp: (!) 100.8 F (38.2 C)    TempSrc: Oral    SpO2: 98% 96% 97%  Weight: 113.4 kg    Height: 6' (1.829 m)    PainSc: 4       Isolation Precautions No active isolations  Medications Medications  iopamidol (ISOVUE-300) 61 % injection (has no administration in time range)  sodium chloride (PF) 0.9 % injection (has no administration in time range)  sodium chloride 0.9 % bolus 1,000 mL (has no administration in time range)  ceFEPIme (MAXIPIME) 2 g in sodium chloride 0.9 % 100 mL IVPB (has no  administration in time range)  sodium chloride flush (NS) 0.9 % injection 3 mL (3 mLs Intravenous Given 12/06/18 2234)  cefTRIAXone (ROCEPHIN) 1 g in sodium chloride 0.9 % 100 mL IVPB (0 g Intravenous Stopped 12/06/18 2337)  sodium chloride 0.9 % bolus 1,000 mL (0 mLs Intravenous Stopped  12/06/18 2339)  ibuprofen (ADVIL,MOTRIN) tablet 800 mg (800 mg Oral Given 12/06/18 2233)  iopamidol (ISOVUE-300) 61 % injection 100 mL (100 mLs Intravenous Contrast Given 12/06/18 2322)    Mobility walks with person assist

## 2018-12-07 NOTE — H&P (Signed)
Kristopher Thompson IOM:[id number] DOB: [birth year] DOA: 12/06/2018     PCP: Lorene Dy, MD   Outpatient Specialists: Surgery Dr.  Marcello Moores Urology Dr.Borden  Patient arrived to ER on 12/06/18 at 2009  Patient coming from: home Lives  With family    Chief Complaint:  Chief Complaint  Patient presents with  . Generalized Body Aches  . Fever  . Abdominal Pain    HPI: Kristopher Thompson is a 68 y.o. male with medical history significant of prostate cancer, bladder cancer left ureteral obstruction  recurrent sepsis.  Rectal tear requiring colostomy and then colostomy reversal    Presented with   fever and body aches since yesterday.  reports 2 episodes of shaking chills. Reports feels like prior episodes of uti. Reports abdominal pain when he stands up. Patient report he had a   urological procedure done yesterday by IR.  Apparently to evaluate for stricture with removal of stent is no stricture was identified.  He was premedicated with Cipro 519m BID for 3 days.   Afterward he developed fever, chills, body aches as well as abdominal pain.  Similar prior urological infections.  denies any chest pain no URI no shortness of breath no   vomiting but some nausea no diarrhea   has his urostomy tube exchange monthly by Dr. BAlinda Money Regarding pertinent Chronic problems: prostate cancer s/p radical prostatectomy in 09/2012, bladder cancer s/p  cystoprostatectomy/ileal conduitrope with abdominal perineal resection with rectal tear req colostomy after rectal injury 08/08/17,  delayed urine leak, s/p urinoma drain placement on 11/23/18by IR,s/p left PCN 10/07/17, andnephrostogram withureteral stent placement on 11/10/2017, and exchange of ureteral stent on 03/01/2018;  History of sepsis secondary to Klebsiella pneumonia  Hx of mild HTN not on any medications  While in ER: Noted to be tachycardic up to 115 temperature of 100.8, elevated white blood cell count meeting sepsis criteria No  evidence of endorgan damage with normal lactic acid and no evidence of hypotension.  The following Work up has been ordered so far:  Orders Placed This Encounter  Procedures  . Culture, blood (Routine x 2)  . Urine Culture  . DG Chest 2 View  . CT ABDOMEN PELVIS W CONTRAST  . Comprehensive metabolic panel  . CBC with Differential  . Protime-INR  . Urinalysis, Routine w reflex microscopic  . Notify Physician if pt is possible Sepsis patient  . Document Actual / Estimated Weight  . Insert / maintain saline lock  . Consult to hospitalist  . Consult to urology     Following Medications were ordered in ER: Medications  iopamidol (ISOVUE-300) 61 % injection (has no administration in time range)  sodium chloride (PF) 0.9 % injection (has no administration in time range)  sodium chloride flush (NS) 0.9 % injection 3 mL (3 mLs Intravenous Given 12/06/18 2234)  cefTRIAXone (ROCEPHIN) 1 g in sodium chloride 0.9 % 100 mL IVPB (0 g Intravenous Stopped 12/06/18 2337)  sodium chloride 0.9 % bolus 1,000 mL (1,000 mLs Intravenous New Bag/Given 12/06/18 2308)  ibuprofen (ADVIL,MOTRIN) tablet 800 mg (800 mg Oral Given 12/06/18 2233)  iopamidol (ISOVUE-300) 61 % injection 100 mL (100 mLs Intravenous Contrast Given 12/06/18 2322)    Significant initial  Findings: Abnormal Labs Reviewed  COMPREHENSIVE METABOLIC PANEL - Abnormal; Notable for the following components:      Result Value   Glucose, Bld 109 (*)    All other components within normal limits  CBC WITH DIFFERENTIAL/PLATELET - Abnormal; Notable for  the following components:   WBC 14.7 (*)    RBC 5.96 (*)    Platelets 149 (*)    Neutro Abs 10.7 (*)    Monocytes Absolute 2.0 (*)    Abs Immature Granulocytes 0.08 (*)    All other components within normal limits  URINALYSIS, ROUTINE W REFLEX MICROSCOPIC - Abnormal; Notable for the following components:   APPearance HAZY (*)    Hgb urine dipstick MODERATE (*)    Leukocytes, UA LARGE (*)     WBC, UA >50 (*)    Bacteria, UA RARE (*)    All other components within normal limits     Lactic Acid, Venous    Component Value Date/Time   LATICACIDVEN 1.8 12/06/2018 2044    Na 140 K 3.7  Cr    Up from baseline see below Lab Results  Component Value Date   CREATININE 1.00 12/06/2018   CREATININE 0.85 06/17/2018   CREATININE 1.01 06/16/2018      WBC 14.7  HG/HCT   Stable     Component Value Date/Time   HGB 16.0 12/06/2018 2044   HCT 50.1 12/06/2018 2044      UA elevated red and white blood cells in urine with rare bacteria  CXR - NON acute  CTabd/pelvis -moderate left hydroureteronephrosis and perinephric edema consistent with urinary tract infection  ECG: order  Personally reviewed by me showing: HR : 89 Rhythm:  NSR,   no evidence of ischemic changes QTC 513    ED Triage Vitals [12/06/18 2027]  Enc Vitals Group     BP (!) 144/94     Pulse Rate (!) 115     Resp 20     Temp (!) 100.8 F (38.2 C)     Temp Source Oral     SpO2 98 %     Weight 250 lb (113.4 kg)     Height 6' (1.829 m)     Head Circumference      Peak Flow      Pain Score 4     Pain Loc      Pain Edu?      Excl. in [place]?   BCWU(88)@       Latest  Blood pressure 118/67, pulse (!) 105, temperature (!) 100.8 F (38.2 C), temperature source Oral, resp. rate 18, height 6' (1.829 m), weight 113.4 kg, SpO2 96 %.    ER Provider Called:     Dr. Clydene Laming  They Recommend admit to medicine on  antibiotics decompensates may need IR consult for nephrostomy placement Will see in AM   Hospitalist was called for admission for sepsis in the setting of UTI recent instrumentation   Review of Systems:    Pertinent positives include: Fevers, chills, fatigue, abdominal pain, nausea  Constitutional:  No weight loss, night sweats,  weight loss  HEENT:  No headaches, Difficulty swallowing,Tooth/dental problems,Sore throat,  No sneezing, itching, ear ache, nasal congestion, post nasal drip,    Cardio-vascular:  No chest pain, Orthopnea, PND, anasarca, dizziness, palpitations.no Bilateral lower extremity swelling  GI:  No heartburn, indigestion,, vomiting, diarrhea, change in bowel habits, loss of appetite, melena, blood in stool, hematemesis Resp:  no shortness of breath at rest. No dyspnea on exertion, No excess mucus, no productive cough, No non-productive cough, No coughing up of blood.No change in color of mucus.No wheezing. Skin:  no rash or lesions. No jaundice GU:  no dysuria, change in color of urine, no urgency or frequency. No straining to  urinate.  No flank pain.  Musculoskeletal:  No joint pain or no joint swelling. No decreased range of motion. No back pain.  Psych:  No change in mood or affect. No depression or anxiety. No memory loss.  Neuro: no localizing neurological complaints, no tingling, no weakness, no double vision, no gait abnormality, no slurred speech, no confusion  All systems reviewed and apart from [place] all are negative  Past Medical History:   Past Medical History:  Diagnosis Date  . At risk for sleep apnea    12-25-2017   STOP-BANG SCORE= 5   --- SENT TO PCP  . Bladder cancer [facility]) dx 07/2017   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  s/p  cystectomy w/ ileal conduit urinary diversion  . Colostomy in place [facility])    since 08-08-2017-- per pt 12-25-2017 reddness around stoma  . GERD (gastroesophageal reflux disease)   . H/O hiatal hernia   . History of sepsis 09/2017   dx bacteremia due to klebsiella pneumoniae,  post op intraabdominal abscess  . Prostate cancer [facility]) urologist-- dr [name]   10-02-2012 s/p  prostatectomy-- Stage T1c  . RBBB   . Renal disorder    pt. denies  . Wears glasses       Past Surgical History:  Procedure Laterality Date  . ABDOMINAL SURGERY    . APPENDECTOMY  1972  . CHOLECYSTECTOMY  1985  . COLOSTOMY REVERSAL N/A 01/08/2018   Procedure: COLOSTOMY REVERSAL;  Surgeon: Leighton Ruff, MD;  Location: [facility]  ORS;  Service: General;  Laterality: N/A;  . CYSTOSCOPY WITH RETROGRADE PYELOGRAM, URETEROSCOPY AND STENT PLACEMENT Right 06/10/2017   Procedure: CYSTOSCOPY WITH RIGHT URETEROSCOPY WITH RIGHT STENT PLACEMENT;  Surgeon: Raynelle Bring, MD;  Location: [facility] ORS;  Service: Urology;  Laterality: Right;  . EUS N/A 04/16/2018   Procedure: FULL UPPER ENDOSCOPIC ULTRASOUND (EUS) RADIAL;  Surgeon: Arta Silence, MD;  Location: [facility] ENDOSCOPY;  Service: Endoscopy;  Laterality: N/A;  . FLEXIBLE SIGMOIDOSCOPY N/A 12/13/2017   Procedure: FLEXIBLE SIGMOIDOSCOPY;  Surgeon: Leighton Ruff, MD;  Location: [facility] ENDOSCOPY;  Service: Endoscopy;  Laterality: N/A;  . ILEO CONDUIT    . IR CATHETER TUBE CHANGE  12/13/2017  . IR CATHETER TUBE CHANGE  01/17/2018  . IR CATHETER TUBE CHANGE  02/28/2018  . IR CATHETER TUBE CHANGE  07/18/2018  . IR CATHETER TUBE CHANGE  08/22/2018  . IR CATHETER TUBE CHANGE  10/31/2018  . IR CONVERT LEFT NEPHROSTOMY TO NEPHROURETERAL CATH  10/24/2017  . IR EXT NEPHROURETERAL CATH EXCHANGE  05/19/2018  . IR EXT NEPHROURETERAL CATH EXCHANGE  06/16/2018  . IR NEPHRO TUBE REMOV/FL  10/24/2017  . IR NEPHROSTOGRAM LEFT THRU EXISTING ACCESS  12/05/2018  . IR NEPHROSTOMY PLACEMENT LEFT  10/07/2017  . IR NEPHROSTOMY TUBE CHANGE  04/11/2018  . ROBOT ASSISTED LAPAROSCOPIC RADICAL PROSTATECTOMY  10/02/2012   Procedure: ROBOTIC ASSISTED LAPAROSCOPIC RADICAL PROSTATECTOMY LEVEL 2;  Surgeon: Dutch Gray, MD;  Location: [facility] ORS;  Service: Urology;  Laterality: N/A;  . ROBOTIC ASSISTED LAPAROSCOPIC BLADDER DIVERTICULECTOMY N/A 08/08/2017   Procedure: XI ROBOTIC ASSISTED LAPAROSCOPIC RADICAL CYSTECTOMY COVERTED TO OPEN PELVIC LYMPHADNECTOMY BILATERAL AND ILEAL CONDUIT URINARY DIVERSION;  Surgeon: Raynelle Bring, MD;  Location: [facility] ORS;  Service: Urology;  Laterality: N/A;  . TRANSURETHRAL RESECTION OF BLADDER TUMOR  06/10/2017   Procedure: TRANSURETHRAL RESECTION OF BLADDER TUMOR (TURBT);  Surgeon: Raynelle Bring, MD;  Location: [facility]  ORS;  Service: Urology;;  . XI ROBOT ABDOMINAL PERINEAL RESECTION N/A 08/08/2017   Procedure: REPAIR OF RECTAL  TEAR POSSIBLE PARTIAL PROCTECTOMY, CREATION OF  OSTOMY;  Surgeon: Leighton Ruff, MD;  Location: [facility] ORS;  Service: General;  Laterality: N/A;    Social History:  Ambulatory   Independently     reports that he quit smoking about 38 years ago. His smoking use included cigarettes. He quit after 9.00 years of use. He has never used smokeless tobacco. He reports current alcohol use. He reports that he does not use drugs.     Family History:   Family History  Problem Relation Age of Onset  . Lung cancer Mother   . Hypertension Father   . Colon cancer Other   . CAD Neg Hx   . Diabetes Neg Hx   . Stroke Neg Hx     Allergies: Allergies  Allergen Reactions  . Demerol [Meperidine] Nausea And Vomiting and Other (See Comments)    SEVERE NAUSEA     Prior to Admission medications   Medication Sig Start Date End Date Taking? Authorizing Provider  acetaminophen (TYLENOL) 500 MG tablet Take 1,000 mg by mouth every 8 (eight) hours as needed for mild pain or headache.     [provider]  bisacodyl (DULCOLAX) 5 MG EC tablet Take 10 mg by mouth daily as needed for moderate constipation.    [provider]  Cholecalciferol (VITAMIN D PO) Take 1 capsule by mouth daily.    [provider]  ciprofloxacin (CIPRO) 500 MG tablet Take 500 mg by mouth 2 (two) times daily. 3 day course    [provider]  Cyanocobalamin (VITAMIN B-12 CR PO) Take 1 tablet by mouth daily.    [provider]  D-MANNOSE PO Take 1 tablet by mouth daily.    [provider]  esomeprazole (NEXIUM) 20 MG capsule Take 20 mg by mouth every 3 (three) days.     [provider]  ibuprofen (ADVIL,MOTRIN) 200 MG tablet Take 600 mg by mouth daily as needed for headache or moderate pain.     [provider]  KRILL OIL PO Take 1 capsule by mouth daily.     [provider]  levofloxacin (LEVAQUIN) 500 MG tablet Take 500 mg by mouth daily.    [provider]  Multiple Vitamin (MULTIVITAMIN WITH MINERALS) TABS tablet Take 1 tablet by mouth daily.    [provider]  senna (SENOKOT) 8.6 MG TABS tablet Take 1 tablet by mouth daily as needed for mild constipation.    [provider]   Physical Exam: Blood pressure 118/67, pulse (!) 105, temperature (!) 100.8 F (38.2 C), temperature source Oral, resp. rate 18, height 6' (1.829 m), weight 113.4 kg, SpO2 96 %. 1. General:  in No Acute distress   Chronically ill  -appearing 2. Psychological: Alert and  Oriented 3. Head/ENT:  Dry Mucous Membranes                          Head Non traumatic, neck supple                           Poor Dentition 4. SKIN:  decreased Skin turgor,  Skin clean Dry and intact no rash 5. Heart: Regular rate and rhythm no Murmur, no Rub or gallop 6. Lungs:   no wheezes or crackles   7. Abdomen: Soft,  non-tender, Non distended  bowel sounds present, well healed scars noted. Urostomy bag with cloudy urine in palce 8. Lower  extremities: no clubbing, cyanosis, or  edema 9. Neurologically Grossly intact, moving all 4 extremities equally   10. MSK: Normal range of motion   LABS:     Recent Labs  Lab 12/06/18 2044  WBC 14.7*  NEUTROABS 10.7*  HGB 16.0  HCT 50.1  MCV 84.1  PLT 735*   Basic Metabolic Panel: Recent Labs  Lab 12/06/18 2044  NA 140  K 3.7  CL 109  CO2 23  GLUCOSE 109*  BUN 20  CREATININE 1.00  CALCIUM 9.2      Recent Labs  Lab 12/06/18 2044  AST 17  ALT 17  ALKPHOS 52  BILITOT 0.6  PROT 7.6  ALBUMIN 4.1   No results for input(s): LIPASE, AMYLASE in the last 168 hours. No results for input(s): AMMONIA in the last 168 hours.    HbA1C: No results for input(s): HGBA1C in the last 72 hours. CBG: No results for input(s): GLUCAP in the last 168 hours.    Urine analysis:    Component Value Date/Time    COLORURINE YELLOW 12/06/2018 2237   APPEARANCEUR HAZY (A) 12/06/2018 2237   LABSPEC 1.014 12/06/2018 2237   PHURINE 6.0 12/06/2018 2237   GLUCOSEU NEGATIVE 12/06/2018 2237   HGBUR MODERATE (A) 12/06/2018 2237   BILIRUBINUR NEGATIVE 12/06/2018 2237   KETONESUR NEGATIVE 12/06/2018 2237   PROTEINUR NEGATIVE 12/06/2018 2237   NITRITE NEGATIVE 12/06/2018 2237   LEUKOCYTESUR LARGE (A) 12/06/2018 2237     Cultures:    Component Value Date/Time   SDES  06/15/2018 0017    URINE, RANDOM Performed at [facility], [place] [address], [place], [place] [address]    SPECREQUEST  06/15/2018 0017    NONE Performed at [facility], [place] [address], [place], [place] [address]    CULT MULTIPLE SPECIES PRESENT, SUGGEST RECOLLECTION (A) 06/15/2018 0017   REPTSTATUS 06/16/2018 FINAL 06/15/2018 0017     Radiological Exams on Admission: Dg Chest 2 View  Result Date: 12/06/2018 CLINICAL DATA:  Fevers EXAM: CHEST - 2 VIEW COMPARISON:  08/29/2018 FINDINGS: Cardiac shadow is within normal limits. Lungs are well aerated bilaterally without focal infiltrate or sizable effusion. No acute bony abnormality is noted. IMPRESSION: No active cardiopulmonary disease. Electronically Signed   By: Inez Catalina M.D.   On: 12/06/2018 20:52   Ct Abdomen Pelvis W Contrast  Result Date: 12/06/2018 CLINICAL DATA:  Recent urostomy procedure, sepsis. Abdominal pain. EXAM: CT ABDOMEN AND PELVIS WITH CONTRAST TECHNIQUE: Multidetector CT imaging of the abdomen and pelvis was performed using the standard protocol following bolus administration of intravenous contrast. CONTRAST:  132m ISOVUE-300 IOPAMIDOL (ISOVUE-300) INJECTION 61% COMPARISON:  Nephrostogram through existing accessed 12/05/2018. Abdominal CT 08/29/2018 FINDINGS: Lower chest: Linear atelectasis in both lower lobes. No confluent airspace disease or pleural effusion. Hepatobiliary: No focal liver abnormality is seen. Status post  cholecystectomy. No biliary dilatation. Pancreas: No ductal dilatation or inflammation. 11 mm cyst in the pancreatic tail is unchanged. Scattered parenchymal calcifications and pancreatic atrophy again seen. Spleen: Normal in size without focal abnormality. Adrenals/Urinary Tract: Normal adrenal glands. Moderate left hydroureteronephrosis and perinephric edema. Mild left ureteral enhancement and ureteral thickening. Ureteral thickening at the ureteral conduit anastomosis. Delayed excretion from the left kidney on delayed phase imaging. No discrete perinephric fluid collection. No right hydronephrosis. The right ureter is decompressed. Ileal conduit collapsed and unremarkable, post cystectomy. Urostomy in the right lower quadrant. Peristomal hernia containing small bowel at the ileal conduit. Stomach/Bowel: Small hiatal hernia. Stomach is nondistended.  Small bowel at the enteric sutures site in the ileum is prominent but no obstruction. Peristomal hernia adjacent to urostomy contains nonobstructed noninflamed small bowel. Umbilical hernia contains nonobstructed noninflamed small bowel. Richter type hernia in the left lower quadrant abdominal scar. No bowel inflammation. The appendix is not visualized, surgically absent per history. Moderate stool burden in the colon. Diverticulosis of the distal colon without diverticulitis. Vascular/Lymphatic: Aortic atherosclerosis without aneurysm. Multiple small retroperitoneal and retrocrural nodes, likely reactive. No pelvic adenopathy. Reproductive: Post prostatectomy. Other: Postsurgical change of the anterior abdominal wall with Richter type hernia involving the sigmoid colon in the left lower quadrant. Umbilical hernia contains nonobstructed noninflamed small bowel. Peristomal hernia as described. No ascites. No free air. Musculoskeletal: There are no acute or suspicious osseous abnormalities. Prominent Schmorl's node superior endplate L1, unchanged from prior. IMPRESSION:  1. Moderate left hydroureteronephrosis and perinephric edema. Mild left ureteral enhancement and ureteral thickening at the renal pelvis, with additional left ureteral thickening at the ileal conduit anastomosis. Findings consistent with urinary tract infection. No stricture was seen on recent urostogram 2. Post cystectomy and ileal conduit . Peristomal hernia containing small bowel. 3. Umbilical and parastomal hernias contain nonobstructed noninflamed small bowel. Richter type hernia of the left lower abdominal wall contains noninflamed sigmoid colon. 4. Colonic diverticulosis without diverticulitis. Additional chronic findings as described. 5.  Aortic Atherosclerosis (ICD10-I70.0). Electronically Signed   By: Keith Rake M.D.   On: 12/06/2018 23:50   Ir Nephrostogram Left Thru Existing Access  Result Date: 12/06/2018 INDICATION: History of cystectomy and chronic indwelling left nephroureteral catheter via ileostomy and ileal conduit for urine leak. Assessment is made today to determine whether the catheter is needed further given the length of time that it has been present. EXAM: NEPHROSTOGRAM THROUGH EXISTING ACCESS UNDER FLUOROSCOPY COMPARISON:  None. MEDICATIONS: None ANESTHESIA/SEDATION: None CONTRAST:  30 mL Omnipaque 300-administered into the collecting system and ureter FLUOROSCOPY TIME:  Fluoroscopy Time: 3 minutes and 24 seconds. 181.0 mGy. COMPLICATIONS: None immediate. PROCEDURE: Informed written consent was obtained from the patient after a thorough discussion of the procedural risks, benefits and alternatives. All questions were addressed. Maximal Sterile Barrier Technique was utilized including caps, mask, sterile gowns, sterile gloves, sterile drape, hand hygiene and skin antiseptic. A timeout was performed prior to the initiation of the procedure. The pre-existing nephroureteral catheter was injected with contrast material. It was then cut and removed over a guidewire. A 7 French sheath  was advanced into the left ureter. Contrast injection was performed at various levels of the left ureter through the sheath and over the guidewire in order to evaluate ureteral patency. The sheath was further retracted into the ileal conduit. Additional contrast injection was performed. A 5 French catheter was also advanced into the ureter and additional contrast injection performed to further evaluate distal ureteral patency. On completion of the procedure, guidewire and sheath were removed. FINDINGS: Nephrostogram demonstrates normal patency of the left ureter without evidence of significant stricture or contrast extravasation. Contrast flows normally into the ileal conduit. No filling defects identified. Due to findings, decision was made to completely remove the nephroureteral catheter permanently. IMPRESSION: Nephrostogram and detailed contrast study of the left ureter demonstrates no evidence of significant ureteral stricture or contrast extravasation. Normal flow of contrast is noted into the ileal conduit. Retrograde nephroureteral catheter access was therefore completely removed. Electronically Signed   By: Aletta Edouard M.D.   On: 12/06/2018 09:44    Chart has been reviewed    Assessment/Plan  67  y.o. male with medical history significant of prostate cancer, bladder cancer left ureteral obstruction  recurrent sepsis.  Rectal tear requiring colostomy and then colostomy reversal Admitted for sepsis in the setting of UTI recent instrumentation   Present on Admission: . Sepsis ([place])  -SIRS criteria met with   elevated white blood cell count tachycardia ,   fever.   Likely source being urinary  without evidence of end organ damage   - Obtain serial lactic acid and procalcitonin level.  - Initiate IV antibiotics and recent instrumentation will start on cefepime  - await results of blood and urine culture  - Rehydrate aggressively If decompensates will need IR consult for nephrostomy  placement  . 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 08/08/2017 complex history followed by urology and IR with multiple recent procedures.  Recently status post stent removal.  Appreciate and urology input . GERD (gastroesophageal reflux disease) stable chronic   . Prolonged QT interval - - will monitor on tele avoid QT prolonging medications, rehydrate correct electrolytes And tachycardia will monitor on telemetry . Pyelonephritis obtain urine culture continue cefepime if becomes   . Hydronephrosis.  Urology likely chronic in the setting of ileal conduit unlikely to have any obstruction   Other plan as per orders.  DVT prophylaxis:  SCD     Code Status:  FULL CODE as per patient  I had personally discussed CODE STATUS with patient and family  Family Communication:   Family   at  Bedside  plan of care was discussed with  Wife,  Disposition Plan:     To home once workup is complete and patient is stable                     Consults called:   UROLOGY was made aware by ER provider   Admission status: Obs    Level of care  tele  For 12H         Bobi Daudelin 12/07/2018, 12:56 AM    [facility]     after 2 AM please page floor coverage PA If 7AM-7PM, please contact the day team taking care of the patient using [email or web address]

## 2018-12-08 DIAGNOSIS — Z9049 Acquired absence of other specified parts of digestive tract: Secondary | ICD-10-CM | POA: Diagnosis not present

## 2018-12-08 DIAGNOSIS — I10 Essential (primary) hypertension: Secondary | ICD-10-CM | POA: Diagnosis present

## 2018-12-08 DIAGNOSIS — A419 Sepsis, unspecified organism: Secondary | ICD-10-CM | POA: Diagnosis not present

## 2018-12-08 DIAGNOSIS — N12 Tubulo-interstitial nephritis, not specified as acute or chronic: Secondary | ICD-10-CM | POA: Diagnosis not present

## 2018-12-08 DIAGNOSIS — N1330 Unspecified hydronephrosis: Secondary | ICD-10-CM | POA: Diagnosis not present

## 2018-12-08 DIAGNOSIS — Z8249 Family history of ischemic heart disease and other diseases of the circulatory system: Secondary | ICD-10-CM | POA: Diagnosis not present

## 2018-12-08 DIAGNOSIS — Z885 Allergy status to narcotic agent status: Secondary | ICD-10-CM | POA: Diagnosis not present

## 2018-12-08 DIAGNOSIS — Z8551 Personal history of malignant neoplasm of bladder: Secondary | ICD-10-CM | POA: Diagnosis not present

## 2018-12-08 DIAGNOSIS — N390 Urinary tract infection, site not specified: Secondary | ICD-10-CM

## 2018-12-08 DIAGNOSIS — K219 Gastro-esophageal reflux disease without esophagitis: Secondary | ICD-10-CM | POA: Diagnosis present

## 2018-12-08 DIAGNOSIS — B9620 Unspecified Escherichia coli [E. coli] as the cause of diseases classified elsewhere: Secondary | ICD-10-CM

## 2018-12-08 DIAGNOSIS — Z801 Family history of malignant neoplasm of trachea, bronchus and lung: Secondary | ICD-10-CM | POA: Diagnosis not present

## 2018-12-08 DIAGNOSIS — Z8546 Personal history of malignant neoplasm of prostate: Secondary | ICD-10-CM | POA: Diagnosis not present

## 2018-12-08 DIAGNOSIS — R7881 Bacteremia: Secondary | ICD-10-CM | POA: Diagnosis not present

## 2018-12-08 DIAGNOSIS — K449 Diaphragmatic hernia without obstruction or gangrene: Secondary | ICD-10-CM | POA: Diagnosis present

## 2018-12-08 DIAGNOSIS — I4510 Unspecified right bundle-branch block: Secondary | ICD-10-CM | POA: Diagnosis present

## 2018-12-08 DIAGNOSIS — Z87891 Personal history of nicotine dependence: Secondary | ICD-10-CM | POA: Diagnosis not present

## 2018-12-08 DIAGNOSIS — N136 Pyonephrosis: Secondary | ICD-10-CM | POA: Diagnosis present

## 2018-12-08 DIAGNOSIS — R9431 Abnormal electrocardiogram [ECG] [EKG]: Secondary | ICD-10-CM | POA: Diagnosis present

## 2018-12-08 DIAGNOSIS — Z906 Acquired absence of other parts of urinary tract: Secondary | ICD-10-CM | POA: Diagnosis not present

## 2018-12-08 DIAGNOSIS — C679 Malignant neoplasm of bladder, unspecified: Secondary | ICD-10-CM | POA: Diagnosis not present

## 2018-12-08 DIAGNOSIS — T8144XA Sepsis following a procedure, initial encounter: Secondary | ICD-10-CM | POA: Diagnosis present

## 2018-12-08 LAB — BASIC METABOLIC PANEL
Anion gap: 8 (ref 5–15)
BUN: 18 mg/dL (ref 8–23)
CO2: 21 mmol/L — ABNORMAL LOW (ref 22–32)
Calcium: 8.6 mg/dL — ABNORMAL LOW (ref 8.9–10.3)
Chloride: 111 mmol/L (ref 98–111)
Creatinine, Ser: 0.91 mg/dL (ref 0.61–1.24)
GFR calc Af Amer: >60 mL/min (ref >60)
Glucose, Bld: 106 mg/dL — ABNORMAL HIGH (ref 70–99)
Potassium: 3.8 mmol/L (ref 3.5–5.1)
Sodium: 140 mmol/L (ref 135–145)

## 2018-12-08 LAB — CBC WITH DIFFERENTIAL/PLATELET
ABS IMMATURE GRANULOCYTES: 0.04 10*3/uL (ref 0.00–0.07)
BASOS PCT: 1 %
Basophils Absolute: 0.1 10*3/uL (ref 0.0–0.1)
Eosinophils Absolute: 0.5 10*3/uL (ref 0.0–0.5)
Eosinophils Relative: 5 %
HCT: 44.2 % (ref 39.0–52.0)
Hemoglobin: 13.9 g/dL (ref 13.0–17.0)
Immature Granulocytes: 1 %
Lymphocytes Relative: 15 %
Lymphs Abs: 1.3 10*3/uL (ref 0.7–4.0)
MCH: 26.1 pg (ref 26.0–34.0)
MCHC: 31.4 g/dL (ref 30.0–36.0)
MCV: 83.1 fL (ref 80.0–100.0)
Monocytes Absolute: 1.2 10*3/uL — ABNORMAL HIGH (ref 0.1–1.0)
Monocytes Relative: 14 %
Neutro Abs: 5.5 10*3/uL (ref 1.7–7.7)
Neutrophils Relative %: 64 %
Platelets: 172 10*3/uL (ref 150–400)
RBC: 5.32 MIL/uL (ref 4.22–5.81)
RDW: 14.6 % (ref 11.5–15.5)
WBC: 8.5 10*3/uL (ref 4.0–10.5)
nRBC: 0 % (ref 0.0–0.2)

## 2018-12-08 LAB — MAGNESIUM: Magnesium: 2.3 mg/dL (ref 1.7–2.4)

## 2018-12-08 LAB — URINE CULTURE: Culture: NO GROWTH

## 2018-12-08 NOTE — Care Management Obs Status (Signed)
MEDICARE OBSERVATION STATUS NOTIFICATION   Patient Details  Name: Kristopher Thompson MRN: [id number] Date of Birth: [birth year]   Medicare Observation Status Notification Given:  Yes    Leeroy Cha, RN 12/08/2018, 2:31 PM

## 2018-12-08 NOTE — Progress Notes (Signed)
PROGRESS NOTE    Kristopher Thompson  MEQ:[id number] DOB: 1951-01-28 DOA: 12/06/2018 PCP: Lorene Dy, MD    Brief Narrative:  Patient 68 year old gentleman history of prostate cancer, bladder cancer with left ureteral obstruction, rectal tear requiring colostomy and then colostomy reversal, for urethral catheter removal per IR 12/05/2018 after retrograde contrast study suggested patency of distal left urethra presented with fevers, malaise, left flank pain, tachycardia, leukocytosis with CT abdomen and pelvis concerning for moderate left hydroureteronephrosis and perinephric edema concerning for pyelonephritis.  Patient admitted, sending for UTI.  Urine cultures pending.  Patient placed empirically on IV cefepime.  Urology consulted and following.   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  Urinalysis concerning for UTI.  Urine cultures pending.  Blood cultures pending.  Patient improving clinically and currently afebrile x24 hours.  Continue IV cefepime.  Urology following and appreciate their input and recommendations.  2.  History of bladder cancer status post cystectomy and ileal conduit 08/08/2017 Patient with a complex history.  Being followed by urology and IR and has undergone multiple recent procedures.  Status post stent removal.  Urology following and appreciate input and recommendations.  3.  Hydronephrosis .  4.  Gastroesophageal reflux disease PPI.  5.  Prolonged QT interval Avoid QT prolonging medications.  Follow.   DVT prophylaxis: SCDs Code Status: Full Family Communication: Updated patient and wife at  bedside. Disposition Plan: Likely home once urine cultures have resulted with clinical improvement hopefully tomorrow.   Consultants:   Urology: Dr. Junious Silk 12/07/2018  Procedures:   CT abdomen and pelvis 12/06/2018  Chest x-ray 12/06/2018  Antimicrobials:   IV cefepime 12/07/2018   Subjective: Sitting up in bed.  States he feels better than on admission.  Denies any abdominal pain.  No nausea or vomiting.  Denies any chest pain.  No shortness of breath.  Tolerating oral intake.  Objective: Vitals:   12/07/18 0454 12/07/18 1616 12/07/18 2059 12/08/18 0458  BP: 131/78 126/76 127/71 130/70  Pulse: 87 87 83 76  Resp: 18 20 20 18   Temp: 98.2 F (36.8 C) 98.7 F (37.1 C) 98.5 F (36.9 C) 98.6 F (37 C)  TempSrc: Oral Oral    SpO2: 97% 95% 97% 97%  Weight:    119.6 kg  Height:        Intake/Output Summary (Last 24 hours) at 12/08/2018 1058 Last data filed at 12/08/2018 0600 Gross per 24 hour  Intake 1530 ml  Output 4800 ml  Net -3270 ml   Filed Weights   12/06/18 2027 12/07/18 0100 12/08/18 0458  Weight: 113.4 kg 119.3 kg 119.6 kg    Examination:  General exam: Appears calm and comfortable  Respiratory system: Clear to auscultation. Respiratory effort normal. Cardiovascular system: S1 & S2 heard, RRR. No JVD, murmurs, rubs, gallops or clicks. No pedal edema. Gastrointestinal system: Abdomen is nondistended, soft and nontender. No organomegaly or masses felt. Normal bowel sounds heard. Central nervous system: Alert and oriented. No focal neurological deficits. Extremities: Symmetric 5 x 5 power. Skin: No rashes, lesions or ulcers Psychiatry: Judgement and insight appear normal. Mood & affect appropriate.     Data Reviewed: I have personally reviewed following labs  and imaging studies  CBC: Recent Labs  Lab 12/06/18 2044 12/07/18 0525 12/08/18 0540  WBC 14.7* 9.6 8.5  NEUTROABS 10.7*  --  5.5  HGB 16.0 13.6 13.9  HCT 50.1 43.6 44.2  MCV 84.1 84.3 83.1    PLT 149* 151 277   Basic Metabolic Panel: Recent Labs  Lab 12/06/18 2044 12/07/18 0525 12/08/18 0540  NA 140 141 140  K 3.7 3.7 3.8  CL 109 115* 111  CO2 23 21* 21*  GLUCOSE 109* 105* 106*  BUN 20 16 18   CREATININE 1.00 0.92 0.91  CALCIUM 9.2 7.9* 8.6*  MG  --  2.2 2.3  PHOS  --  2.9  --    GFR: Estimated Creatinine Clearance: 106.6 mL/min (by C-G formula based on SCr of 0.91 mg/dL). Liver Function Tests: Recent Labs  Lab 12/06/18 2044 12/07/18 0525  AST 17 11*  ALT 17 12  ALKPHOS 52 39  BILITOT 0.6 0.6  PROT 7.6 6.1*  ALBUMIN 4.1 3.3*   No results for input(s): LIPASE, AMYLASE in the last 168 hours. No results for input(s): AMMONIA in the last 168 hours. Coagulation Profile: Recent Labs  Lab 12/06/18 2044  INR 1.00   Cardiac Enzymes: No results for input(s): CKTOTAL, CKMB, CKMBINDEX, TROPONINI in the last 168 hours. BNP (last 3 results) No results for input(s): PROBNP in the last 8760 hours. HbA1C: No results for input(s): HGBA1C in the last 72 hours. CBG: No results for input(s): GLUCAP in the last 168 hours. Lipid Profile: No results for input(s): CHOL, HDL, LDLCALC, TRIG, CHOLHDL, LDLDIRECT in the last 72 hours. Thyroid Function Tests: Recent Labs    12/07/18 0525  TSH 0.385   Anemia Panel: No results for input(s): VITAMINB12, FOLATE, FERRITIN, TIBC, IRON, RETICCTPCT in the last 72 hours. Sepsis Labs: Recent Labs  Lab 12/06/18 2044 12/07/18 0154 12/07/18 0525  PROCALCITON  --  <0.10  --   LATICACIDVEN 1.8 0.8 0.8    Recent Results (from the past 240 hour(s))  Urine Culture     Status: None   Collection Time: 12/06/18 10:37 PM  Result Value Ref Range Status   Specimen Description   Final    URINE, RANDOM Performed at [facility] [address] [place], [place] [address]    Special Requests   Final    NONE Performed at [facility], [place] [address] [place], [place] [address]    Culture    Final    NO GROWTH Performed at [facility], [place] [address] [place],  [address]    Report Status 12/08/2018 FINAL  Final         Radiology Studies: Dg Chest 2 View  Result Date: 12/06/2018 CLINICAL DATA:  Fevers EXAM: CHEST - 2 VIEW COMPARISON:  08/29/2018 FINDINGS: Cardiac shadow is within normal limits. Lungs are well aerated bilaterally without focal infiltrate or sizable effusion. No acute bony abnormality is noted. IMPRESSION: No active cardiopulmonary disease. Electronically Signed   By: Inez Catalina M.D.   On: 12/06/2018 20:52   Ct Abdomen Pelvis W Contrast  Result Date: 12/06/2018 CLINICAL DATA:  Recent urostomy procedure, sepsis. Abdominal pain. EXAM: CT ABDOMEN AND PELVIS WITH CONTRAST TECHNIQUE: Multidetector CT imaging of the abdomen and pelvis was performed using the standard protocol following bolus administration of intravenous contrast. CONTRAST:  166mL ISOVUE-300 IOPAMIDOL (ISOVUE-300) INJECTION 61% COMPARISON:  Nephrostogram through existing accessed 12/05/2018. Abdominal CT 08/29/2018 FINDINGS: Lower chest: Linear atelectasis in both lower lobes. No  confluent airspace disease or pleural effusion. Hepatobiliary: No focal liver abnormality is seen. Status post cholecystectomy. No biliary dilatation. Pancreas: No ductal dilatation or inflammation. 11 mm cyst in the pancreatic tail is unchanged. Scattered parenchymal calcifications and pancreatic atrophy again seen. Spleen: Normal in size without focal abnormality. Adrenals/Urinary Tract: Normal adrenal glands. Moderate left hydroureteronephrosis and perinephric edema. Mild left ureteral enhancement and ureteral thickening. Ureteral thickening at the ureteral conduit anastomosis. Delayed excretion from the left kidney on delayed phase imaging. No discrete perinephric fluid collection. No right hydronephrosis. The right ureter is decompressed. Ileal conduit collapsed and unremarkable, post cystectomy. Urostomy in the  right lower quadrant. Peristomal hernia containing small bowel at the ileal conduit. Stomach/Bowel: Small hiatal hernia. Stomach is nondistended. Small bowel at the enteric sutures site in the ileum is prominent but no obstruction. Peristomal hernia adjacent to urostomy contains nonobstructed noninflamed small bowel. Umbilical hernia contains nonobstructed noninflamed small bowel. Richter type hernia in the left lower quadrant abdominal scar. No bowel inflammation. The appendix is not visualized, surgically absent per history. Moderate stool burden in the colon. Diverticulosis of the distal colon without diverticulitis. Vascular/Lymphatic: Aortic atherosclerosis without aneurysm. Multiple small retroperitoneal and retrocrural nodes, likely reactive. No pelvic adenopathy. Reproductive: Post prostatectomy. Other: Postsurgical change of the anterior abdominal wall with Richter type hernia involving the sigmoid colon in the left lower quadrant. Umbilical hernia contains nonobstructed noninflamed small bowel. Peristomal hernia as described. No ascites. No free air. Musculoskeletal: There are no acute or suspicious osseous abnormalities. Prominent Schmorl's node superior endplate L1, unchanged from prior. IMPRESSION: 1. Moderate left hydroureteronephrosis and perinephric edema. Mild left ureteral enhancement and ureteral thickening at the renal pelvis, with additional left ureteral thickening at the ileal conduit anastomosis. Findings consistent with urinary tract infection. No stricture was seen on recent urostogram 2. Post cystectomy and ileal conduit . Peristomal hernia containing small bowel. 3. Umbilical and parastomal hernias contain nonobstructed noninflamed small bowel. Richter type hernia of the left lower abdominal wall contains noninflamed sigmoid colon. 4. Colonic diverticulosis without diverticulitis. Additional chronic findings as described. 5.  Aortic Atherosclerosis (ICD10-I70.0). Electronically Signed    By: Keith Rake M.D.   On: 12/06/2018 23:50        Scheduled Meds: . pantoprazole  40 mg Oral Q0600  . pneumococcal 23 valent vaccine  0.5 mL Intramuscular Tomorrow-1000   Continuous Infusions: . ceFEPime (MAXIPIME) IV 1 g (12/08/18 0859)     LOS: 0 days    Time spent: 35 mins    Irine Seal, MD [facility]  If 7PM-7AM, please contact night-coverage [email or web address] 12/08/2018, 10:58 AM

## 2018-12-08 NOTE — Progress Notes (Signed)
Patient ID: Kristopher Thompson, male   DOB: 09-06-1951, 68 y.o.   MRN: [id number]    Subjective: Pt well known to me.  Nephroureteral catheter removed on 1/24 per Dr. Kathlene Cote in IR after retrograde contrast study suggested patency of distal left ureter.  He presented with low grade temp and malaise and some mild left flank pain.  Admitted with IV antibiotic therapy on 1/25.   Today, Kristopher Thompson is feeling improved.  No fever last 24 hour.  Objective: Vital signs in last 24 hours: Temp:  [98.5 F (36.9 C)-98.7 F (37.1 C)] 98.6 F (37 C) (01/27 0458) Pulse Rate:  [76-87] 76 (01/27 0458) Resp:  [18-20] 18 (01/27 0458) BP: (126-130)/(70-76) 130/70 (01/27 0458) SpO2:  [95 %-97 %] 97 % (01/27 0458) Weight:  [119.6 kg] 119.6 kg (01/27 0458)  Intake/Output from previous day: 01/26 0701 - 01/27 0700 In: 3295 [P.O.:720; I.V.:750; IV Piggyback:300] Out: 4800 [Urine:4800] Intake/Output this shift: No intake/output data recorded.  Physical Exam:  General: Alert and oriented Abd: Mild left CVAT  Lab Results: Recent Labs    12/06/18 2044 12/07/18 0525 12/08/18 0540  HGB 16.0 13.6 13.9  HCT 50.1 43.6 44.2   CBC Latest Ref Rng & Units 12/08/2018 12/07/2018 12/06/2018  WBC 4.0 - 10.5 K/uL 8.5 9.6 14.7(H)  Hemoglobin 13.0 - 17.0 g/dL 13.9 13.6 16.0  Hematocrit 39.0 - 52.0 % 44.2 43.6 50.1  Platelets 150 - 400 K/uL 172 151 149(L)     BMET Recent Labs    12/07/18 0525 12/08/18 0540  NA 141 140  K 3.7 3.8  CL 115* 111  CO2 21* 21*  GLUCOSE 105* 106*  BUN 16 18  CREATININE 0.92 0.91  CALCIUM 7.9* 8.6*     Studies/Results: Dg Chest 2 View  Result Date: 12/06/2018 CLINICAL DATA:  Fevers EXAM: CHEST - 2 VIEW COMPARISON:  08/29/2018 FINDINGS: Cardiac shadow is within normal limits. Lungs are well aerated bilaterally without focal infiltrate or sizable effusion. No acute bony abnormality is noted. IMPRESSION: No active cardiopulmonary disease. Electronically Signed   By: Inez Catalina  M.D.   On: 12/06/2018 20:52   Ct Abdomen Pelvis W Contrast  Result Date: 12/06/2018 CLINICAL DATA:  Recent urostomy procedure, sepsis. Abdominal pain. EXAM: CT ABDOMEN AND PELVIS WITH CONTRAST TECHNIQUE: Multidetector CT imaging of the abdomen and pelvis was performed using the standard protocol following bolus administration of intravenous contrast. CONTRAST:  153mL ISOVUE-300 IOPAMIDOL (ISOVUE-300) INJECTION 61% COMPARISON:  Nephrostogram through existing accessed 12/05/2018. Abdominal CT 08/29/2018 FINDINGS: Lower chest: Linear atelectasis in both lower lobes. No confluent airspace disease or pleural effusion. Hepatobiliary: No focal liver abnormality is seen. Status post cholecystectomy. No biliary dilatation. Pancreas: No ductal dilatation or inflammation. 11 mm cyst in the pancreatic tail is unchanged. Scattered parenchymal calcifications and pancreatic atrophy again seen. Spleen: Normal in size without focal abnormality. Adrenals/Urinary Tract: Normal adrenal glands. Moderate left hydroureteronephrosis and perinephric edema. Mild left ureteral enhancement and ureteral thickening. Ureteral thickening at the ureteral conduit anastomosis. Delayed excretion from the left kidney on delayed phase imaging. No discrete perinephric fluid collection. No right hydronephrosis. The right ureter is decompressed. Ileal conduit collapsed and unremarkable, post cystectomy. Urostomy in the right lower quadrant. Peristomal hernia containing small bowel at the ileal conduit. Stomach/Bowel: Small hiatal hernia. Stomach is nondistended. Small bowel at the enteric sutures site in the ileum is prominent but no obstruction. Peristomal hernia adjacent to urostomy contains nonobstructed noninflamed small bowel. Umbilical hernia contains nonobstructed noninflamed small bowel. Richter type  hernia in the left lower quadrant abdominal scar. No bowel inflammation. The appendix is not visualized, surgically absent per history. Moderate  stool burden in the colon. Diverticulosis of the distal colon without diverticulitis. Vascular/Lymphatic: Aortic atherosclerosis without aneurysm. Multiple small retroperitoneal and retrocrural nodes, likely reactive. No pelvic adenopathy. Reproductive: Post prostatectomy. Other: Postsurgical change of the anterior abdominal wall with Richter type hernia involving the sigmoid colon in the left lower quadrant. Umbilical hernia contains nonobstructed noninflamed small bowel. Peristomal hernia as described. No ascites. No free air. Musculoskeletal: There are no acute or suspicious osseous abnormalities. Prominent Schmorl's node superior endplate L1, unchanged from prior. IMPRESSION: 1. Moderate left hydroureteronephrosis and perinephric edema. Mild left ureteral enhancement and ureteral thickening at the renal pelvis, with additional left ureteral thickening at the ileal conduit anastomosis. Findings consistent with urinary tract infection. No stricture was seen on recent urostogram 2. Post cystectomy and ileal conduit . Peristomal hernia containing small bowel. 3. Umbilical and parastomal hernias contain nonobstructed noninflamed small bowel. Richter type hernia of the left lower abdominal wall contains noninflamed sigmoid colon. 4. Colonic diverticulosis without diverticulitis. Additional chronic findings as described. 5.  Aortic Atherosclerosis (ICD10-I70.0). Electronically Signed   By: Keith Rake M.D.   On: 12/06/2018 23:50    Assessment/Plan: 1) Left pyelonephritis/left hydronephrosis: Recent study would suggest that he does not have a complete obstruction.  Would continue antibiotic therapy for pyelonephritis.  Cultures pending and should hopefully result today to direct therapy.  He should complete 10 days of appropriate antibiotic therapy as an outpatient (or empiric therapy if cultures are not helpful).  He has appointment in March for repeat imaging and will plan to re-evaluate hydronephrosis at  that time.  Kristopher Thompson knows to call me sooner if he develops fever, etc.   LOS: 0 days   Dutch Gray 12/08/2018, 8:00 AM

## 2018-12-09 LAB — CBC
HCT: 47.2 % (ref 39.0–52.0)
HEMOGLOBIN: 14.7 g/dL (ref 13.0–17.0)
MCH: 26.3 pg (ref 26.0–34.0)
MCHC: 31.1 g/dL (ref 30.0–36.0)
MCV: 84.6 fL (ref 80.0–100.0)
Platelets: 205 10*3/uL (ref 150–400)
RBC: 5.58 MIL/uL (ref 4.22–5.81)
RDW: 14.5 % (ref 11.5–15.5)
WBC: 7.7 10*3/uL (ref 4.0–10.5)
nRBC: 0 % (ref 0.0–0.2)

## 2018-12-09 LAB — BASIC METABOLIC PANEL
Anion gap: 8 (ref 5–15)
BUN: 20 mg/dL (ref 8–23)
CHLORIDE: 108 mmol/L (ref 98–111)
CO2: 23 mmol/L (ref 22–32)
CREATININE: 1.03 mg/dL (ref 0.61–1.24)
Calcium: 8.7 mg/dL — ABNORMAL LOW (ref 8.9–10.3)
GFR calc Af Amer: >60 mL/min (ref >60)
GFR calc non Af Amer: >60 mL/min (ref >60)
Glucose, Bld: 135 mg/dL — ABNORMAL HIGH (ref 70–99)
Potassium: 3.9 mmol/L (ref 3.5–5.1)
SODIUM: 139 mmol/L (ref 135–145)

## 2018-12-09 MED ORDER — BISACODYL 5 MG PO TBEC: 10.0000 mg | DELAYED_RELEASE_TABLET | Freq: Every day | ORAL | 0 refills | Status: DC | PRN

## 2018-12-09 MED ORDER — CEFPODOXIME PROXETIL 200 MG PO TABS: 200.0000 mg | ORAL_TABLET | Freq: Two times a day (BID) | ORAL | 0 refills | Status: AC

## 2018-12-09 NOTE — Discharge Summary (Signed)
Physician Discharge Summary  Kristopher Thompson OVF:[id number] DOB: [birth year] DOA: 12/06/2018  PCP: Lorene Dy, MD  Admit date: 12/06/2018 Discharge date: 12/09/2018  Time spent: 50 minutes  Recommendations for Outpatient Follow-up:  1. Follow-up with Lorene Dy, MD in 2 to 3 weeks. 2. Follow-up with Dr. [name], urology in 2 weeks.  On follow-up patient's pyelonephritis post recent instrumentation will need to be reassessed and determine as to whether patient needs further antibiotics at that time.   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 Gaston Islam is a 68 y.o. male with medical history significant of prostate cancer, bladder cancer left ureteral obstruction  recurrent sepsis. Rectal tear requiring colostomy and then colostomy reversal Presented with   fever and body aches since yesterday. reported 2 episodes of shaking chills. Reported feels like prior episodes of uti. Reported abdominal pain when he stood up. Patient reported he had a   urological procedure done the day prior to admission, by IR.  Apparently to evaluate for stricture with removal of stent there was no stricture was identified.  He was premedicated with Cipro 500mg  BID for 3 days. Afterward he developed fever, chills, body aches as well as abdominal pain.  Similar prior urological infections.  denied any chest pain no URI no shortness of breath no   vomiting but some nausea no diarrhea   has his urostomy tube exchange monthly by Dr.  [name]  Regarding pertinent Chronic problems: prostate cancers/p radical prostatectomyin 09/2012,bladder cancer s/pcystoprostatectomy/ileal conduitrope with abdominal perineal resection with rectal tearreqcolostomy after rectal injury 08/08/17,delayed urine leak, s/p urinoma drain placement on 11/23/18by IR,s/p left PCN 10/07/17,andnephrostogram withureteral stent placementon 11/10/2017,and exchange of ureteral stent on 03/01/2018;  History of sepsis secondary to Klebsiella pneumonia  Hx of mild HTN not on any medications  While in ER: Noted to be tachycardic up to 115 temperature of 100.8, elevated white blood cell count meeting sepsis criteria No evidence of endorgan damage with normal lactic acid and no evidence of hypotension.  Hospital Course:  1 sepsis secondary to pyelonephritis after recent instrumentation Patient presented with sepsis criteria of fever, leukocytosis, tachycardia, and instrumentation.  Urinalysis concerning for UTI.    Patient was admitted and placed empirically on IV cefepime.  Patient was noted 1 day prior to admission had had a nephroureteral catheter removed on 12/05/2018 per Dr. Kathlene Cote in IR after retrograde contrast study suggested patency of distal left ureter.  Urine cultures which were done with no growth to date.  Blood cultures were ordered with no growth to date by day of discharge.  Patient improved clinically.  Patient remained afebrile for 24 to 48 hours prior to discharge.  Patient was noted to have been placed on ciprofloxacin 3 days prior to his procedure however still developed sepsis secondary to pyelonephritis.  Case was discussed with ID and it was recommended that patient be discharged home on [place] for 10 more days.  Patient will be discharged in stable and improved condition.  Outpatient follow-up with urology.   2.  History of bladder  cancer status post cystectomy and ileal conduit 08/08/2017 Patient with a complex history.   Being followed by urology and IR and has undergone multiple recent procedures.  Status post stent removal the day prior to admission.  Urology was consulted and followed the patient throughout the hospitalization.  Outpatient follow-up.   3.  Hydronephrosis .  Patient was seen by urology during the hospitalization who recommended outpatient follow-up with repeat imaging and reevaluation of hydronephrosis at that time.   4.  Gastroesophageal reflux disease Patient maintained on a PPI.  5.  Prolonged QT interval Avoided QT prolonging medications.  Repeat EKG on day of discharge showed resolution of QT prolongation.    Procedures:  CT abdomen and pelvis 12/06/2018  Chest x-ray 12/06/2018   Consultations:  Urology: Dr. Junious Silk 12/07/2018  Discharge Exam: Vitals:   12/09/18 0514 12/09/18 1008  BP: 122/82 130/79  Pulse: 74 71  Resp: 16 18  Temp: 98.2 F (36.8 C) 97.6 F (36.4 C)  SpO2: 98% 100%    General: NAD Cardiovascular: RRR Respiratory: CTAB  Discharge Instructions   Discharge Instructions    Diet general   Complete by:  As directed    Increase activity slowly   Complete by:  As directed      Allergies as of 12/09/2018      Reactions   Demerol [meperidine] Nausea And Vomiting, Other (See Comments)   SEVERE NAUSEA      Medication List    STOP taking these medications   ciprofloxacin 500 MG tablet Commonly known as:  CIPRO     TAKE these medications   acetaminophen 500 MG tablet Commonly known as:  TYLENOL Take 1,000 mg by mouth every 8 (eight) hours as needed for mild pain or headache.   bisacodyl 5 MG EC tablet Commonly known as:  DULCOLAX Take 2 tablets (10 mg total) by mouth daily as needed for moderate constipation.   cefpodoxime 200 MG tablet Commonly known as:  VANTIN Take 1 tablet (200 mg total) by mouth 2 (two) times daily for 10 days.   D-MANNOSE PO Take 1 tablet by mouth daily.   esomeprazole 20 MG capsule Commonly known as:   NEXIUM Take 20 mg by mouth every 3 (three) days.   ibuprofen 200 MG tablet Commonly known as:  ADVIL,MOTRIN Take 600 mg by mouth daily as needed for headache or moderate pain.   senna 8.6 MG Tabs tablet Commonly known as:  SENOKOT Take 1 tablet by mouth daily as needed for mild constipation.      Allergies  Allergen Reactions  . Demerol [Meperidine] Nausea And Vomiting and Other (See Comments)    SEVERE NAUSEA   Follow-up Information    Raynelle Bring, MD. Schedule an appointment as soon as possible for a visit in 2 week(s).   Specialty:  Urology Why:  f/u in 2 weeks. Contact information: [place] [address] [phone]        Lorene Dy, MD. Schedule an appointment as soon as possible for a visit in 2 week(s).   Specialty:  Internal Medicine Why:  f/u in 2-3 weeks Contact information: [place], [place]  [address] [phone]            The results of significant diagnostics from this hospitalization (including imaging, microbiology, ancillary and laboratory) are listed below for reference.    Significant Diagnostic Studies: Dg Chest 2 View  Result Date: 12/06/2018 CLINICAL DATA:  Fevers EXAM: CHEST - 2 VIEW COMPARISON:  08/29/2018 FINDINGS: Cardiac shadow is within normal limits. Lungs are well aerated bilaterally without focal infiltrate or sizable effusion. No acute bony abnormality is noted. IMPRESSION: No active cardiopulmonary disease. Electronically Signed   By: Inez Catalina M.D.   On: 12/06/2018 20:52   Ct Abdomen Pelvis W Contrast  Result Date: 12/06/2018 CLINICAL DATA:  Recent urostomy procedure, sepsis. Abdominal pain. EXAM: CT ABDOMEN AND PELVIS WITH CONTRAST TECHNIQUE: Multidetector CT imaging of the abdomen and pelvis was performed using the standard protocol following bolus administration of intravenous contrast. CONTRAST:  133mL ISOVUE-300 IOPAMIDOL (ISOVUE-300) INJECTION 61% COMPARISON:  Nephrostogram through  existing accessed 12/05/2018. Abdominal CT 08/29/2018 FINDINGS: Lower chest: Linear atelectasis in both lower lobes. No confluent airspace disease or pleural effusion. Hepatobiliary: No focal liver abnormality is seen. Status post cholecystectomy. No biliary dilatation. Pancreas: No ductal dilatation or inflammation. 11 mm cyst in the pancreatic tail is unchanged. Scattered parenchymal calcifications and pancreatic atrophy again seen. Spleen: Normal in size without focal abnormality. Adrenals/Urinary Tract: Normal adrenal glands. Moderate left hydroureteronephrosis and perinephric edema. Mild left ureteral enhancement and ureteral thickening. Ureteral thickening at the ureteral conduit anastomosis. Delayed excretion from the left kidney on delayed phase imaging. No discrete perinephric fluid collection. No right hydronephrosis. The right ureter is decompressed. Ileal conduit collapsed and unremarkable, post cystectomy. Urostomy in the right lower quadrant. Peristomal hernia containing small bowel at the ileal conduit. Stomach/Bowel: Small hiatal hernia. Stomach is nondistended. Small bowel at the enteric sutures site in the ileum is prominent but no obstruction. Peristomal hernia adjacent to urostomy contains nonobstructed noninflamed small bowel. Umbilical hernia contains nonobstructed noninflamed small bowel. Richter type hernia in the left lower quadrant abdominal scar. No bowel inflammation. The appendix is not visualized, surgically absent per history. Moderate stool burden in the colon. Diverticulosis of the distal colon without diverticulitis. Vascular/Lymphatic: Aortic atherosclerosis without aneurysm. Multiple small retroperitoneal and retrocrural nodes, likely reactive. No pelvic adenopathy. Reproductive: Post prostatectomy. Other: Postsurgical change of the anterior abdominal wall with Richter type hernia involving the sigmoid colon in the left lower quadrant. Umbilical hernia contains nonobstructed  noninflamed small bowel. Peristomal hernia as described. No ascites. No free air. Musculoskeletal: There are no acute or suspicious osseous abnormalities. Prominent Schmorl's node superior endplate L1, unchanged from prior. IMPRESSION: 1. Moderate left hydroureteronephrosis and perinephric edema. Mild left ureteral enhancement and ureteral thickening at the renal pelvis, with additional left ureteral thickening at the ileal conduit anastomosis. Findings consistent with urinary tract infection. No stricture was seen on recent urostogram 2. Post cystectomy and ileal conduit . Peristomal hernia containing small bowel. 3. Umbilical and parastomal hernias contain nonobstructed noninflamed small bowel. Richter type hernia of the left lower abdominal wall contains noninflamed sigmoid colon. 4. Colonic diverticulosis without diverticulitis. Additional chronic findings as described. 5.  Aortic Atherosclerosis (ICD10-I70.0). Electronically Signed   By: Keith Rake M.D.   On: 12/06/2018 23:50   Ir Nephrostogram Left Thru Existing Access  Result Date: 12/06/2018 INDICATION: History of cystectomy and chronic indwelling left nephroureteral catheter via ileostomy and ileal conduit for urine leak. Assessment is made today to determine whether the catheter is needed further given the length of time that it has been present. EXAM: NEPHROSTOGRAM THROUGH EXISTING ACCESS UNDER FLUOROSCOPY COMPARISON:  None. MEDICATIONS: None ANESTHESIA/SEDATION: None CONTRAST:  30 mL Omnipaque 300-administered into the collecting system and ureter FLUOROSCOPY TIME:  Fluoroscopy Time: 3 minutes and 24 seconds. 181.0 mGy. COMPLICATIONS: None immediate. PROCEDURE: Informed written consent was obtained from the patient after a thorough  discussion of the procedural risks, benefits and alternatives. All questions were addressed. Maximal Sterile Barrier Technique was utilized including caps, mask, sterile gowns, sterile gloves, sterile drape, hand  hygiene and skin antiseptic. A timeout was performed prior to the initiation of the procedure. The pre-existing nephroureteral catheter was injected with contrast material. It was then cut and removed over a guidewire. A 7 French sheath was advanced into the left ureter. Contrast injection was performed at various levels of the left ureter through the sheath and over the guidewire in order to evaluate ureteral patency. The sheath was further retracted into the ileal conduit. Additional contrast injection was performed. A 5 French catheter was also advanced into the ureter and additional contrast injection performed to further evaluate distal ureteral patency. On completion of the procedure, guidewire and sheath were removed. FINDINGS: Nephrostogram demonstrates normal patency of the left ureter without evidence of significant stricture or contrast extravasation. Contrast flows normally into the ileal conduit. No filling defects identified. Due to findings, decision was made to completely remove the nephroureteral catheter permanently. IMPRESSION: Nephrostogram and detailed contrast study of the left ureter demonstrates no evidence of significant ureteral stricture or contrast extravasation. Normal flow of contrast is noted into the ileal conduit. Retrograde nephroureteral catheter access was therefore completely removed. Electronically Signed   By: Aletta Edouard M.D.   On: 12/06/2018 09:44    Microbiology: Recent Results (from the past 240 hour(s))  Urine Culture     Status: None   Collection Time: 12/06/18 10:37 PM  Result Value Ref Range Status   Specimen Description   Final    URINE, RANDOM Performed at [facility] [address] [place], [place] [address]    Special Requests   Final    NONE Performed at [facility], [place] [address], [place], [place] [address]    Culture   Final    NO GROWTH Performed at [facility], [place] [address]  [place], [place] [address]    Report Status 12/08/2018 FINAL  Final  Culture, blood (Routine x 2)     Status: None (Preliminary result)   Collection Time: 12/06/18 11:00 PM  Result Value Ref Range Status   Specimen Description   Final    BLOOD RIGHT HAND Performed at [facility] [address] [place], [place] [address]    Special Requests   Final    BOTTLES DRAWN AEROBIC AND ANAEROBIC Blood Culture adequate volume Performed at [facility] [address], [place], [place] [address]    Culture   Final    NO GROWTH 2 DAYS Performed at [facility] [address] [place], [place] [address]    Report Status PENDING  Incomplete  Culture, blood (Routine x 2)     Status: None (Preliminary result)   Collection Time: 12/07/18  1:54 AM  Result Value Ref Range Status   Specimen Description   Final    BLOOD LEFT ANTECUBITAL Performed at [facility] [address], [place], [place] [address]    Special Requests   Final    BOTTLES DRAWN AEROBIC ONLY Blood Culture adequate volume Performed at [facility] [address], [place], [place] [address]    Culture   Final    NO GROWTH 2 DAYS Performed at [facility] [address], [place],  [address]    Report Status PENDING  Incomplete     Labs: Basic  Metabolic Panel: Recent Labs  Lab 12/06/18 2044 12/07/18 0525 12/08/18 0540 12/09/18 0549  NA 140 141 140 139  K 3.7 3.7 3.8 3.9  CL 109 115* 111 108  CO2 23 21* 21* 23  GLUCOSE 109* 105* 106* 135*  BUN 20 16 18 20   CREATININE 1.00 0.92 0.91 1.03  CALCIUM 9.2 7.9* 8.6* 8.7*  MG  --  2.2 2.3  --   PHOS  --  2.9  --   --    Liver Function Tests: Recent Labs  Lab 12/06/18 2044 12/07/18 0525  AST 17 11*  ALT 17 12  ALKPHOS 52 39  BILITOT 0.6 0.6  PROT 7.6 6.1*  ALBUMIN 4.1 3.3*   No results for input(s): LIPASE, AMYLASE in the last 168 hours. No results for input(s): AMMONIA  in the last 168 hours. CBC: Recent Labs  Lab 12/06/18 2044 12/07/18 0525 12/08/18 0540 12/09/18 0549  WBC 14.7* 9.6 8.5 7.7  NEUTROABS 10.7*  --  5.5  --   HGB 16.0 13.6 13.9 14.7  HCT 50.1 43.6 44.2 47.2  MCV 84.1 84.3 83.1 84.6  PLT 149* 151 172 205   Cardiac Enzymes: No results for input(s): CKTOTAL, CKMB, CKMBINDEX, TROPONINI in the last 168 hours. BNP: BNP (last 3 results) No results for input(s): BNP in the last 8760 hours.  ProBNP (last 3 results) No results for input(s): PROBNP in the last 8760 hours.  CBG: No results for input(s): GLUCAP in the last 168 hours.     Signed:  Irine Seal MD.  [facility] 12/09/2018, 11:41 AM

## 2018-12-09 NOTE — Progress Notes (Signed)
Patient given discharge instructions, and verbalized an understanding of all discharge instructions.  Patient agrees with discharge plan, and is being discharged in stable medical condition.  Patient fully ambulatory, was able to walk out with wife.

## 2018-12-12 LAB — CULTURE, BLOOD (ROUTINE X 2)
Culture: NO GROWTH
Culture: NO GROWTH
SPECIAL REQUESTS: ADEQUATE
Special Requests: ADEQUATE

## 2019-01-09 ENCOUNTER — Other Ambulatory Visit (HOSPITAL_COMMUNITY): Payer: Medicare Other

## 2019-01-19 DIAGNOSIS — Z135 Encounter for screening for eye and ear disorders: Secondary | ICD-10-CM | POA: Diagnosis not present

## 2019-01-19 DIAGNOSIS — H04123 Dry eye syndrome of bilateral lacrimal glands: Secondary | ICD-10-CM | POA: Diagnosis not present

## 2019-01-19 DIAGNOSIS — H524 Presbyopia: Secondary | ICD-10-CM | POA: Diagnosis not present

## 2019-01-19 DIAGNOSIS — H5203 Hypermetropia, bilateral: Secondary | ICD-10-CM | POA: Diagnosis not present

## 2019-02-17 DIAGNOSIS — Z131 Encounter for screening for diabetes mellitus: Secondary | ICD-10-CM | POA: Diagnosis not present

## 2019-02-17 DIAGNOSIS — Z1389 Encounter for screening for other disorder: Secondary | ICD-10-CM | POA: Diagnosis not present

## 2019-02-17 DIAGNOSIS — Z1331 Encounter for screening for depression: Secondary | ICD-10-CM | POA: Diagnosis not present

## 2019-02-17 DIAGNOSIS — H9113 Presbycusis, bilateral: Secondary | ICD-10-CM | POA: Diagnosis not present

## 2019-02-17 DIAGNOSIS — Z0000 Encounter for general adult medical examination without abnormal findings: Secondary | ICD-10-CM | POA: Diagnosis not present

## 2019-02-17 DIAGNOSIS — D485 Neoplasm of uncertain behavior of skin: Secondary | ICD-10-CM | POA: Diagnosis not present

## 2019-02-17 DIAGNOSIS — Z79899 Other long term (current) drug therapy: Secondary | ICD-10-CM | POA: Diagnosis not present

## 2019-02-17 DIAGNOSIS — E7800 Pure hypercholesterolemia, unspecified: Secondary | ICD-10-CM | POA: Diagnosis not present

## 2019-02-17 DIAGNOSIS — Z125 Encounter for screening for malignant neoplasm of prostate: Secondary | ICD-10-CM | POA: Diagnosis not present

## 2019-02-17 DIAGNOSIS — E039 Hypothyroidism, unspecified: Secondary | ICD-10-CM | POA: Diagnosis not present

## 2019-02-17 DIAGNOSIS — Z6835 Body mass index (BMI) 35.0-35.9, adult: Secondary | ICD-10-CM | POA: Diagnosis not present

## 2019-02-17 DIAGNOSIS — R42 Dizziness and giddiness: Secondary | ICD-10-CM | POA: Diagnosis not present

## 2019-02-17 DIAGNOSIS — R002 Palpitations: Secondary | ICD-10-CM | POA: Diagnosis not present

## 2019-02-17 DIAGNOSIS — I70219 Atherosclerosis of native arteries of extremities with intermittent claudication, unspecified extremity: Secondary | ICD-10-CM | POA: Diagnosis not present

## 2019-02-17 DIAGNOSIS — E785 Hyperlipidemia, unspecified: Secondary | ICD-10-CM | POA: Diagnosis not present

## 2019-02-17 DIAGNOSIS — R5383 Other fatigue: Secondary | ICD-10-CM | POA: Diagnosis not present

## 2019-02-17 DIAGNOSIS — R1032 Left lower quadrant pain: Secondary | ICD-10-CM | POA: Diagnosis not present

## 2019-02-27 DIAGNOSIS — C678 Malignant neoplasm of overlapping sites of bladder: Secondary | ICD-10-CM | POA: Diagnosis not present

## 2019-02-27 DIAGNOSIS — N132 Hydronephrosis with renal and ureteral calculous obstruction: Secondary | ICD-10-CM | POA: Diagnosis not present

## 2019-02-27 DIAGNOSIS — R911 Solitary pulmonary nodule: Secondary | ICD-10-CM | POA: Diagnosis not present

## 2019-03-06 ENCOUNTER — Other Ambulatory Visit (HOSPITAL_COMMUNITY): Payer: Medicare Other

## 2019-03-06 DIAGNOSIS — N130 Hydronephrosis with ureteropelvic junction obstruction: Secondary | ICD-10-CM | POA: Diagnosis not present

## 2019-03-06 DIAGNOSIS — R8271 Bacteriuria: Secondary | ICD-10-CM | POA: Diagnosis not present

## 2019-03-06 DIAGNOSIS — C678 Malignant neoplasm of overlapping sites of bladder: Secondary | ICD-10-CM | POA: Diagnosis not present

## 2019-03-09 ENCOUNTER — Other Ambulatory Visit (HOSPITAL_COMMUNITY): Payer: Self-pay | Admitting: Urology

## 2019-03-09 DIAGNOSIS — N130 Hydronephrosis with ureteropelvic junction obstruction: Secondary | ICD-10-CM

## 2019-04-14 ENCOUNTER — Ambulatory Visit (HOSPITAL_COMMUNITY)
Admission: RE | Admit: 2019-04-14 | Discharge: 2019-04-14 | Disposition: A | Discharge status: Home / Self Care | Payer: Medicare Other | Source: Ambulatory Visit | Attending: Urology | Admitting: Urology

## 2019-04-14 ENCOUNTER — Other Ambulatory Visit: Payer: Self-pay

## 2019-04-14 DIAGNOSIS — N130 Hydronephrosis with ureteropelvic junction obstruction: Secondary | ICD-10-CM | POA: Diagnosis not present

## 2019-04-14 DIAGNOSIS — N12 Tubulo-interstitial nephritis, not specified as acute or chronic: Secondary | ICD-10-CM | POA: Diagnosis not present

## 2019-04-14 DIAGNOSIS — N1330 Unspecified hydronephrosis: Secondary | ICD-10-CM | POA: Diagnosis not present

## 2019-04-14 MED ORDER — FUROSEMIDE 10 MG/ML IJ SOLN
INTRAMUSCULAR | Status: AC
  Filled 2019-04-14: qty 8

## 2019-04-14 MED ORDER — FUROSEMIDE 10 MG/ML IJ SOLN
58.0000 mg | Freq: Once | INTRAMUSCULAR | Status: AC
  Administered 2019-04-14: 10:00:00 58 mg via INTRAVENOUS

## 2019-04-14 MED ORDER — TECHNETIUM TC 99M MERTIATIDE
5.1000 | Freq: Once | INTRAVENOUS | Status: AC | PRN
  Administered 2019-04-14: 5.1 via INTRAVENOUS

## 2019-04-16 ENCOUNTER — Other Ambulatory Visit: Payer: Self-pay

## 2019-04-16 ENCOUNTER — Emergency Department (HOSPITAL_COMMUNITY)
Admission: EM | Admit: 2019-04-16 | Discharge: 2019-04-16 | Disposition: A | Discharge status: Home / Self Care | Payer: Medicare Other | Attending: Emergency Medicine | Admitting: Emergency Medicine

## 2019-04-16 ENCOUNTER — Encounter (HOSPITAL_COMMUNITY): Payer: Self-pay | Admitting: Emergency Medicine

## 2019-04-16 DIAGNOSIS — R1032 Left lower quadrant pain: Secondary | ICD-10-CM | POA: Insufficient documentation

## 2019-04-16 DIAGNOSIS — Z5321 Procedure and treatment not carried out due to patient leaving prior to being seen by health care provider: Secondary | ICD-10-CM | POA: Insufficient documentation

## 2019-04-16 NOTE — ED Notes (Signed)
Pt seen walking out of the ED to the parking lot.

## 2019-04-16 NOTE — ED Notes (Signed)
Patient called for room placement x1 with no answer. 

## 2019-04-16 NOTE — ED Triage Notes (Signed)
Patient c/o left flank pain with fever since yesterday. Denies cough and SOB. Hx bladder cancer with bladder removal.

## 2019-04-17 DIAGNOSIS — R8271 Bacteriuria: Secondary | ICD-10-CM | POA: Diagnosis not present

## 2019-04-17 DIAGNOSIS — N10 Acute tubulo-interstitial nephritis: Secondary | ICD-10-CM | POA: Diagnosis not present

## 2019-05-18 DIAGNOSIS — L821 Other seborrheic keratosis: Secondary | ICD-10-CM | POA: Diagnosis not present

## 2019-05-18 DIAGNOSIS — D1801 Hemangioma of skin and subcutaneous tissue: Secondary | ICD-10-CM | POA: Diagnosis not present

## 2019-05-18 DIAGNOSIS — Z85828 Personal history of other malignant neoplasm of skin: Secondary | ICD-10-CM | POA: Diagnosis not present

## 2019-05-18 DIAGNOSIS — D485 Neoplasm of uncertain behavior of skin: Secondary | ICD-10-CM | POA: Diagnosis not present

## 2019-05-18 DIAGNOSIS — L820 Inflamed seborrheic keratosis: Secondary | ICD-10-CM | POA: Diagnosis not present

## 2019-06-16 DIAGNOSIS — Z20828 Contact with and (suspected) exposure to other viral communicable diseases: Secondary | ICD-10-CM | POA: Diagnosis not present

## 2019-06-17 DIAGNOSIS — Z20828 Contact with and (suspected) exposure to other viral communicable diseases: Secondary | ICD-10-CM | POA: Diagnosis not present

## 2019-07-24 DIAGNOSIS — N3000 Acute cystitis without hematuria: Secondary | ICD-10-CM | POA: Diagnosis not present

## 2019-08-01 IMAGING — US IR NEPHROSTOMY PLACEMENT LEFT
1 series · 1 of 1 positions shown · non-contrast
Comparison: CT abdomen and pelvis - 10/04/2017 ;

INDICATION: History of bladder cancer, post radical cystectomy with ileal
conduit creation, now with urine leak presumably secondary to injury
involving the distal aspect the left ureter.

[Series 1: ir (id) (id)/(id)/(id) ir · 1 of 1 slices shown]
[im 1/1]
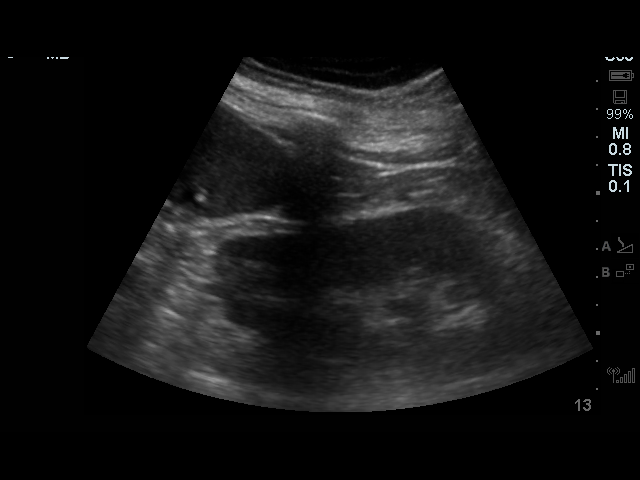

[1 of 1 positions shown; findings below may reference images not displayed]

Patient underwent CT-guided drainage of urinoma on 10/06/2017 by Dr.
Aren and presents now for ultrasound and fluoroscopic guided
left-sided nephrostomy catheter placement and potential antegrade
nephrostogram for urinary diversion purposes.

EXAM:
1. ULTRASOUND GUIDANCE FOR PUNCTURE OF THE LEFT RENAL COLLECTING
SYSTEM
2. LEFT PERCUTANEOUS NEPHROSTOMY TUBE PLACEMENT.
10/06/2017;
CT-guided percutaneous drainage catheter placement - 10/04/2017

MEDICATIONS:
Patient is currently admitted to the hospital and receiving
intravenous antibiotics; The antibiotic was administered in an
appropriate time frame prior to skin puncture.

ANESTHESIA/SEDATION:
Moderate (conscious) sedation was employed during this procedure. A
total of Versed 5.5 mg and Fentanyl 175 mcg was administered
intravenously.

Moderate Sedation Time: 30 minutes. The patient's level of
consciousness and vital signs were monitored continuously by
radiology nursing throughout the procedure under my direct
supervision.

CONTRAST:  25 mL Isovue 300 administered into the collecting system

FLUOROSCOPY TIME:  7 minutes (390 mGy)

COMPLICATIONS:
None immediate.

PROCEDURE:
The procedure, risks, benefits, and alternatives were explained to
the patient. Questions regarding the procedure were encouraged and
answered. The patient understands and consents to the procedure. A
timeout was performed prior to the initiation of the procedure.

The left flank region was prepped with Betadine in a sterile
fashion, and a sterile drape was applied covering the operative
field. A sterile gown and sterile gloves were used for the
procedure. Local anesthesia was provided with 1% Lidocaine with
epinephrine. Ultrasound was used to localize the left kidney. Under
direct ultrasound guidance, a 21 gauge needle was advanced into the
renal collecting system. An ultrasound image documentation was
performed. Access within the collecting system was confirmed with
the efflux of urine followed by contrast injection.

With the left renal collecting system apically opacified, a
posterior inferior calyx was targeted fluoroscopically with a 22
gauge Chiba needle after the overlying soft tissues were
anesthetized with 1% lidocaine with epinephrine. Access to the
collecting system was confirmed with advancement of a Nitrex tracks
wire into the left renal collecting system to the superior aspect
the left ureter.

The track was dilated with an Accustick set. Over a short Amplatz
wire, a 8 French radiopaque tip vascular sheath was advanced through
the collecting systems the superior aspect the left ureter.

Next, a Kumpe catheter was utilized to perform a distal left
nephrostogram. Attempts were made to traverse the area of
extravasation/injury involving the distal aspect the left ureter,
however this ultimately proved unsuccessful.

Next, under intermittent fluoroscopic guidance, the vascular sheath
was exchanged for a 10 French percutaneous nephrostomy catheter with
end ultimately coiled and locked within the left renal pelvis.
Contrast was injected and several sport radiographs were obtained in
various obliquities confirming access.

The catheter was secured at the skin with a Prolene retention suture
and a gravity bag was placed. A dressing was placed. The patient
tolerated procedure well without immediate postprocedural
complication.
FINDINGS: Ultrasound scanning demonstrates a mildly dilated left-sided renal
collecting system, similar preceding abdominal CT.

Under direct ultrasound guidance, the left renal collecting system
was cannulated however initial access demonstrated a suspected
puncture at the level of the infundibulum.

As such, a opacified posterior inferior calyx was targeted
fluoroscopically allowing placement of a vascular sheath to the
level of the superior aspect of the left ureter.

Distal nephrostogram demonstrates extravasation of contrast at the
distal aspect of the left ureter regional to the anastomosis. There
is extravasation of contrast into the adjacent peritoneum
communicating with the end of the percutaneous drainage catheter.

Attempts were made to traverse the distal left ureteral anastomotic
injury however this ultimately proved unsuccessful.

Successful placement of a 10 French percutaneous nephrostomy
catheter with end coiled and locked within left renal pelvis.
Contrast injection confirmed appropriate positioning.
IMPRESSION: 1. Successful ultrasound and fluoroscopic guided placement of a left
sided 10 French PCN.
2. Antegrade nephrostogram demonstrates a distal left ureteral
injury with extravasation of contrast communicating with the
percutaneous drainage catheter.

PLAN:
Would recommend urinary diversion via external drainage for a
minimal of 2 weeks prior to repeated attempted nephroureteral stent
placement.

## 2019-08-05 DIAGNOSIS — Z20828 Contact with and (suspected) exposure to other viral communicable diseases: Secondary | ICD-10-CM | POA: Diagnosis not present

## 2019-09-01 DIAGNOSIS — Z23 Encounter for immunization: Secondary | ICD-10-CM | POA: Diagnosis not present

## 2019-09-21 DIAGNOSIS — U071 COVID-19: Secondary | ICD-10-CM | POA: Diagnosis not present

## 2019-09-21 DIAGNOSIS — R05 Cough: Secondary | ICD-10-CM | POA: Diagnosis not present

## 2019-10-05 DIAGNOSIS — C679 Malignant neoplasm of bladder, unspecified: Secondary | ICD-10-CM | POA: Diagnosis not present

## 2019-10-05 DIAGNOSIS — C678 Malignant neoplasm of overlapping sites of bladder: Secondary | ICD-10-CM | POA: Diagnosis not present

## 2019-10-05 DIAGNOSIS — C61 Malignant neoplasm of prostate: Secondary | ICD-10-CM | POA: Diagnosis not present

## 2019-10-07 DIAGNOSIS — C61 Malignant neoplasm of prostate: Secondary | ICD-10-CM | POA: Diagnosis not present

## 2019-10-07 DIAGNOSIS — C678 Malignant neoplasm of overlapping sites of bladder: Secondary | ICD-10-CM | POA: Diagnosis not present

## 2019-10-07 DIAGNOSIS — N130 Hydronephrosis with ureteropelvic junction obstruction: Secondary | ICD-10-CM | POA: Diagnosis not present

## 2020-01-12 DIAGNOSIS — N10 Acute tubulo-interstitial nephritis: Secondary | ICD-10-CM | POA: Diagnosis not present

## 2020-01-12 DIAGNOSIS — R8271 Bacteriuria: Secondary | ICD-10-CM | POA: Diagnosis not present

## 2020-03-01 DIAGNOSIS — N10 Acute tubulo-interstitial nephritis: Secondary | ICD-10-CM | POA: Diagnosis not present

## 2020-03-12 IMAGING — XA IR EXT NEPHROURETERAL CATH EXCHANGE
1 series · 3 of 3 positions shown · non-contrast
Comparison: 04/11/2018

INDICATION: Left ureteral obstruction

EXAM:
FLUOROSCOPIC EXCHANGE OF THE LEFT NEPHROURETERAL CATHETER

[Series 300: tube placements · 3 of 3 slices shown]
[im 1/3]
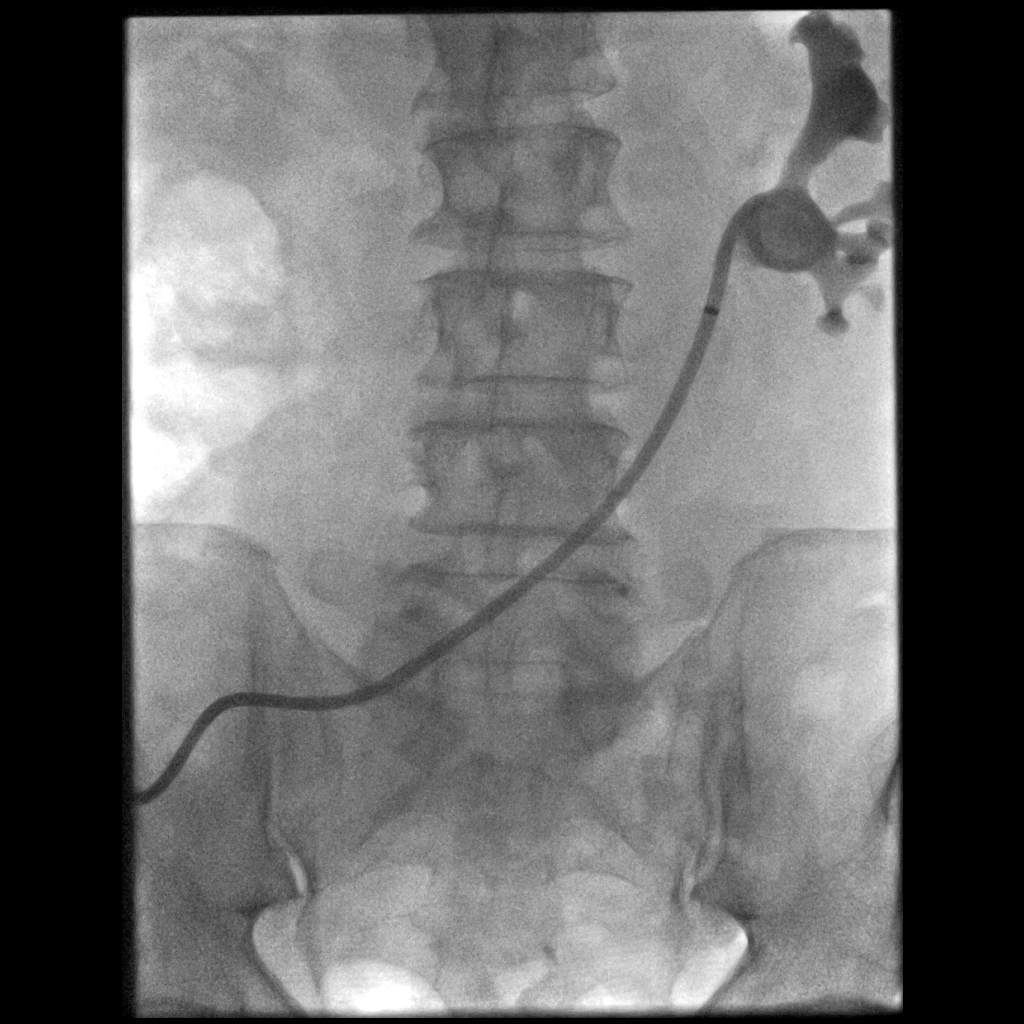
[im 2/3]
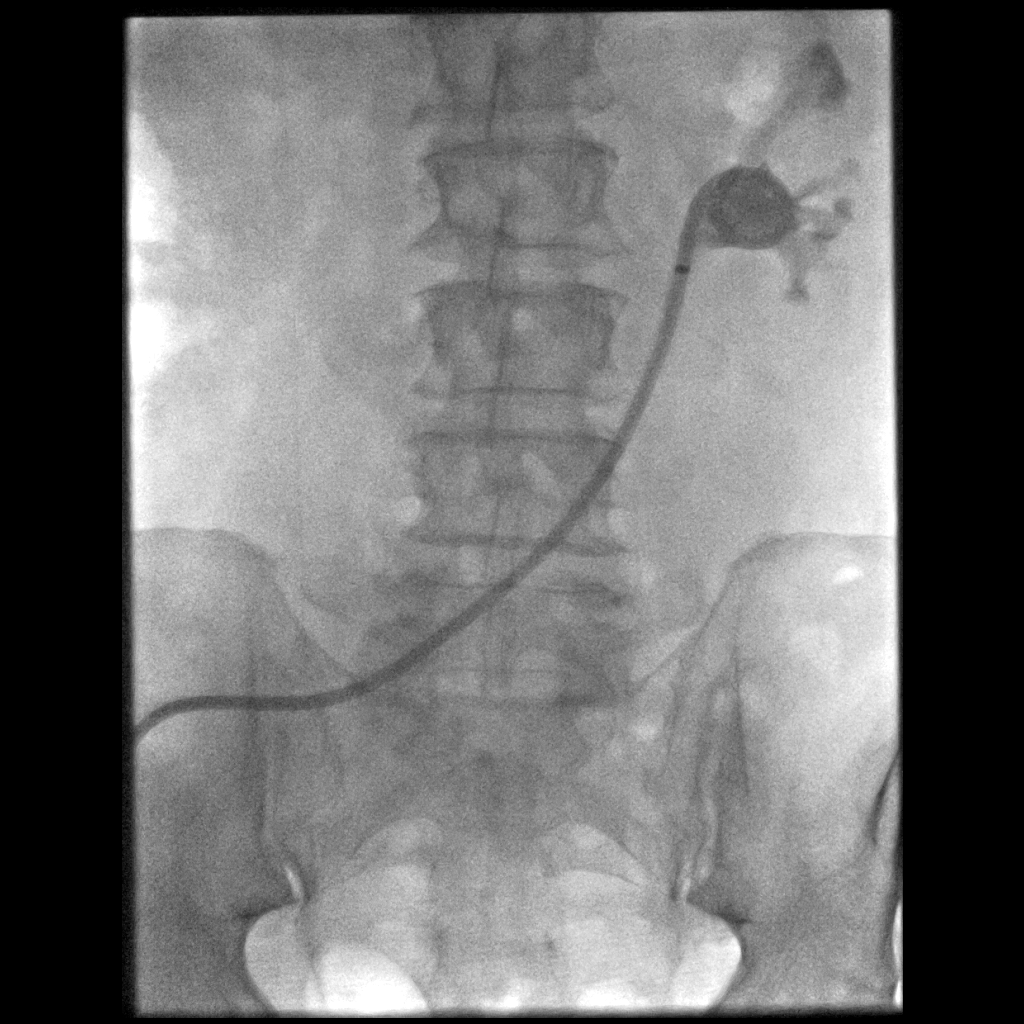
[im 3/3]
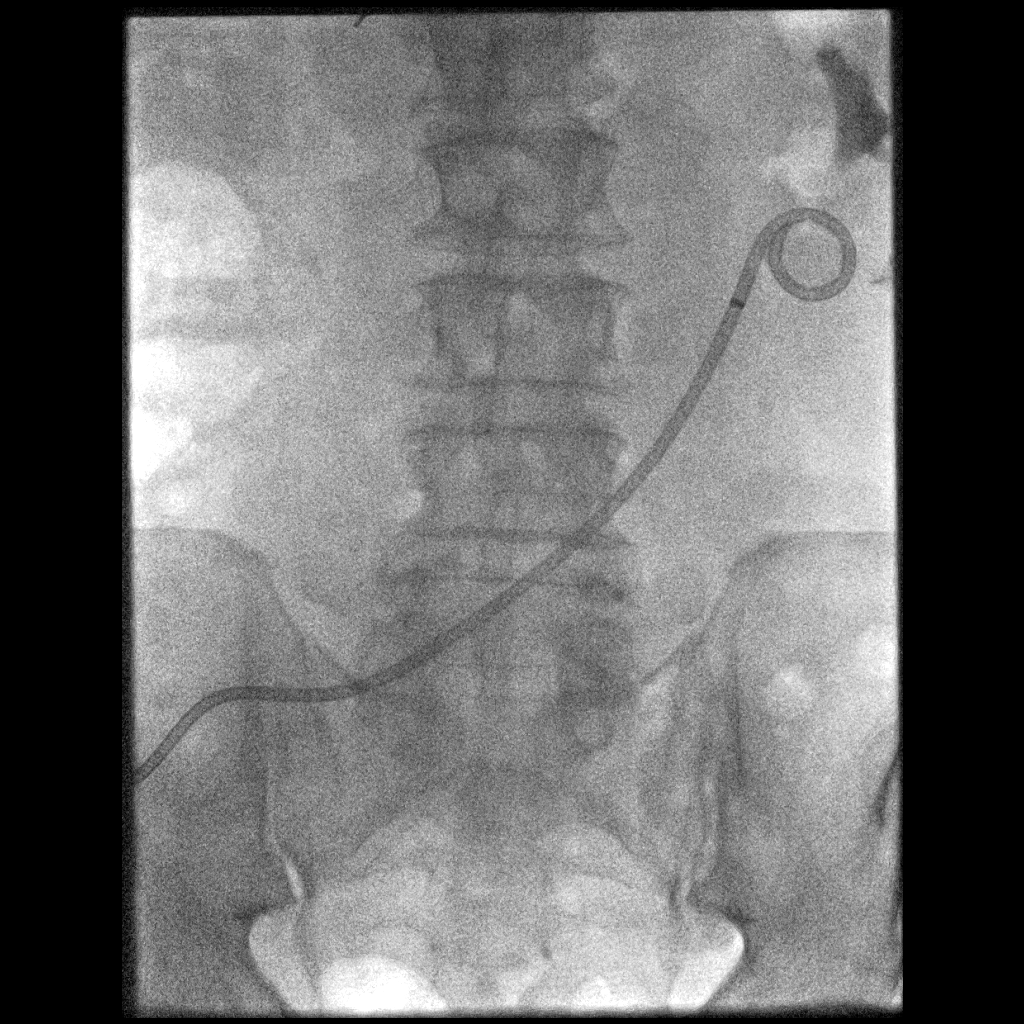

[3 of 3 positions shown; findings below may reference images not displayed]

MEDICATIONS:
NONE.

ANESTHESIA/SEDATION:
Moderate Sedation Time:  None.

The patient was continuously monitored during the procedure by the
interventional radiology nurse under my direct supervision.

CONTRAST:  10 cc Isovue-administered into the collecting system(s)

FLUOROSCOPY TIME:  Fluoroscopy Time: 0 minutes 54 seconds (36 mGy).

COMPLICATIONS:
None immediate.

PROCEDURE:
Informed written consent was obtained from the patient after a
thorough discussion of the procedural risks, benefits and
alternatives. All questions were addressed. Maximal Sterile Barrier
Technique was utilized including caps, mask, sterile gowns, sterile
gloves, sterile drape, hand hygiene and skin antiseptic. A timeout
was performed prior to the initiation of the procedure.

Under sterile conditions, the existing left nephroureteral catheter
exiting the ileal conduit was injected with contrast confirming
position. Catheter was successfully exchanged over a Bentson
guidewire. Retention loop formed the renal pelvis. Contrast
injection confirms position. Images obtained for documentation.
Catheter secured within the ostomy drainage bag.
IMPRESSION: Successful fluoroscopic replacement of the left nephroureteral
catheter through the existing ostomy

## 2020-03-31 DIAGNOSIS — C679 Malignant neoplasm of bladder, unspecified: Secondary | ICD-10-CM | POA: Diagnosis not present

## 2020-03-31 DIAGNOSIS — D519 Vitamin B12 deficiency anemia, unspecified: Secondary | ICD-10-CM | POA: Diagnosis not present

## 2020-03-31 DIAGNOSIS — C678 Malignant neoplasm of overlapping sites of bladder: Secondary | ICD-10-CM | POA: Diagnosis not present

## 2020-03-31 DIAGNOSIS — C61 Malignant neoplasm of prostate: Secondary | ICD-10-CM | POA: Diagnosis not present

## 2020-04-06 DIAGNOSIS — Z8551 Personal history of malignant neoplasm of bladder: Secondary | ICD-10-CM | POA: Diagnosis not present

## 2020-04-06 DIAGNOSIS — Z936 Other artificial openings of urinary tract status: Secondary | ICD-10-CM | POA: Diagnosis not present

## 2020-04-13 DIAGNOSIS — R1032 Left lower quadrant pain: Secondary | ICD-10-CM | POA: Diagnosis not present

## 2020-05-11 IMAGING — XA IR CATHETER TUBE CHANGE
1 series · 4 of 4 positions shown · non-contrast
Comparison: 04/16/2018

INDICATION: 67-year-old male with a history left ureteral obstruction

EXAM:
IMAGE GUIDED ROUTINE EXCHANGE PERCUTANEOUS NEPHROURETERAL DRAIN

[Series 300: tube placements · 4 of 4 slices shown]
[im 1/4]
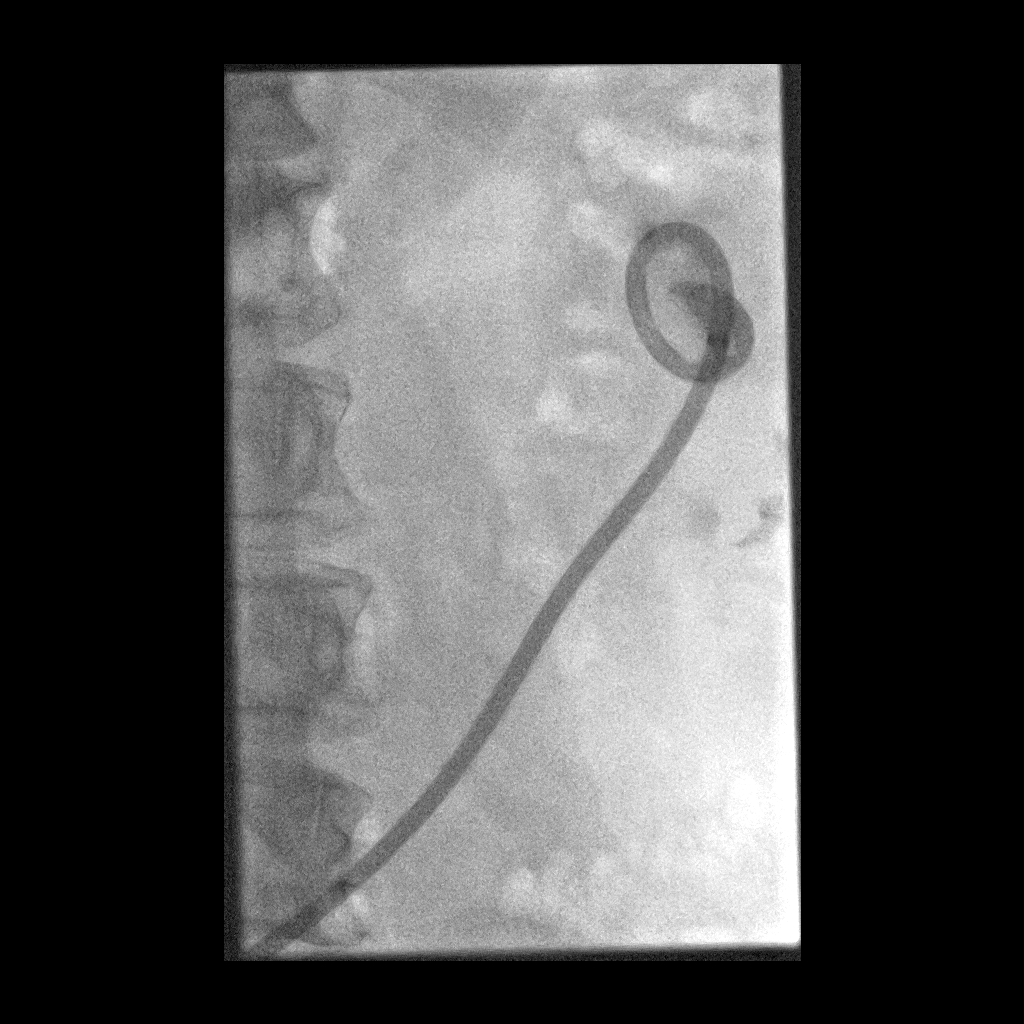
[im 2/4]
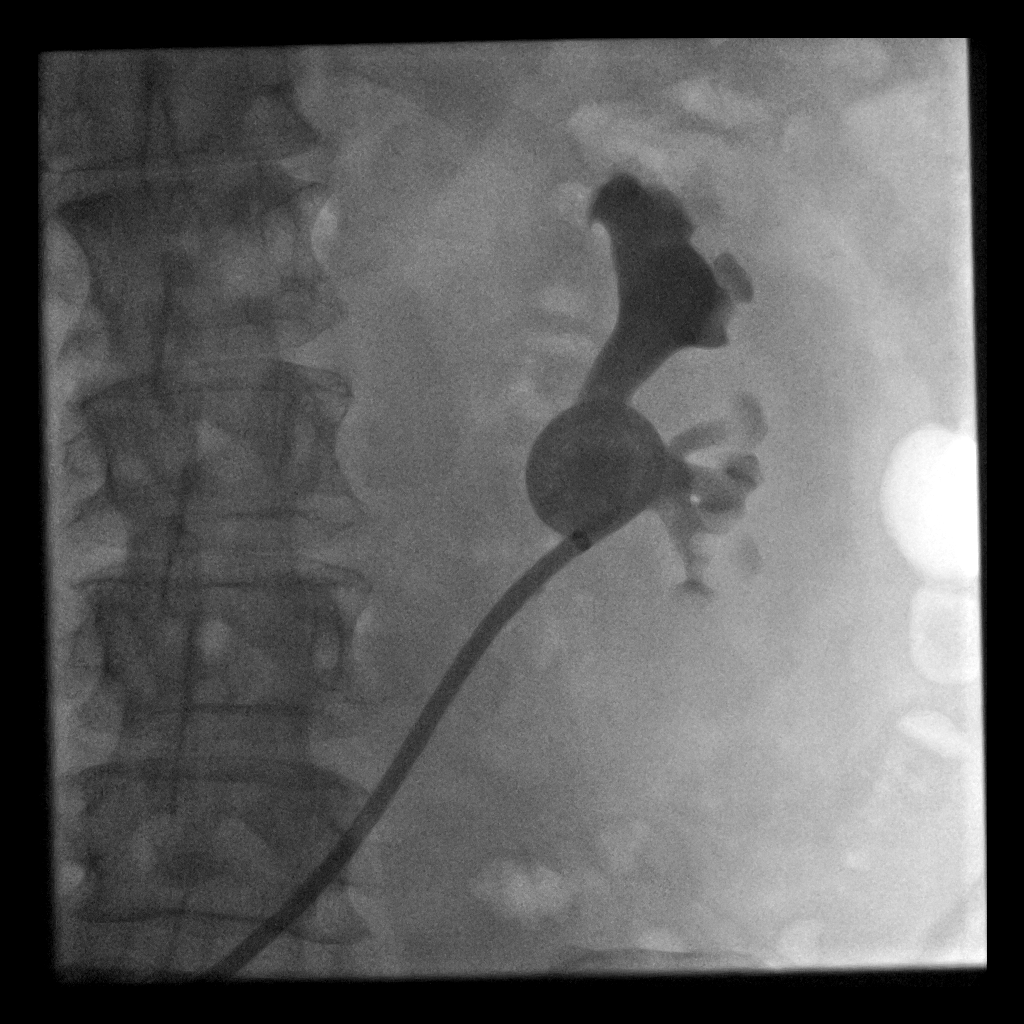
[im 3/4]
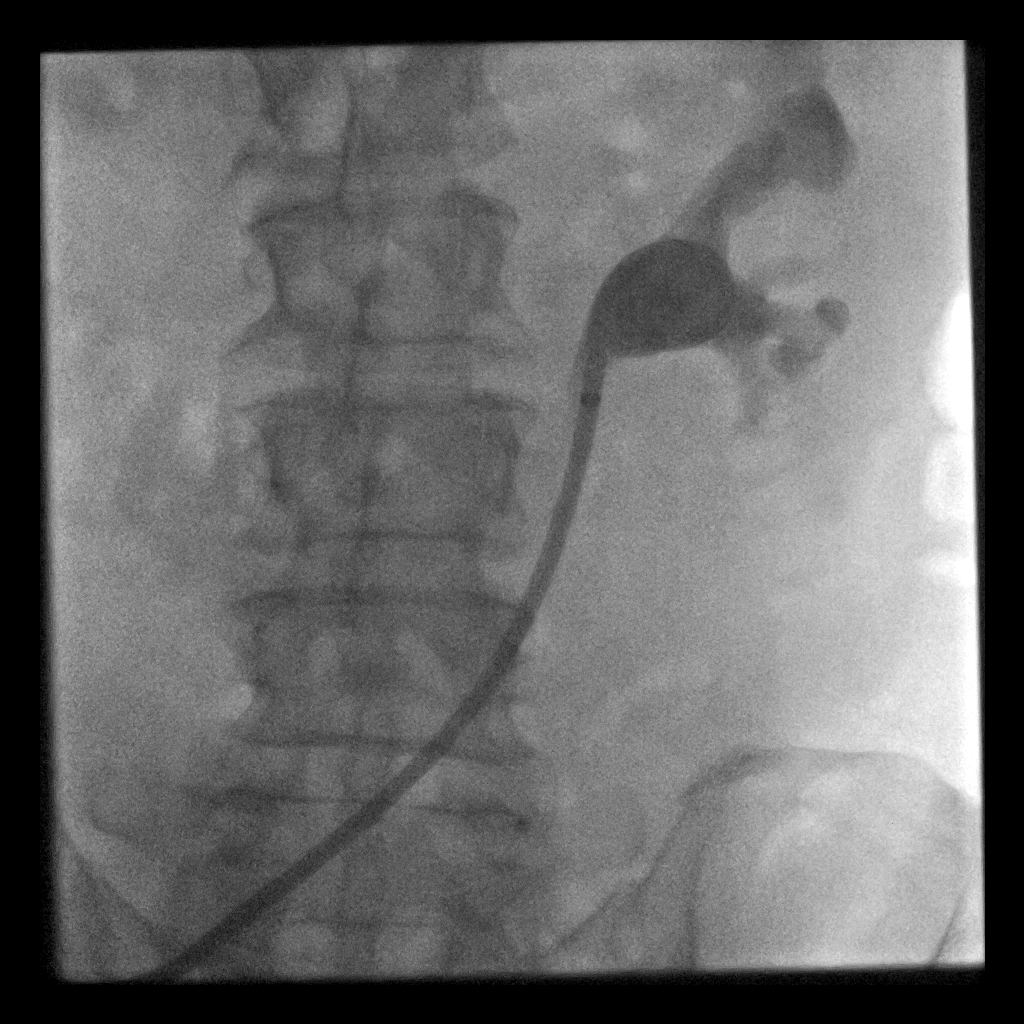
[im 4/4]
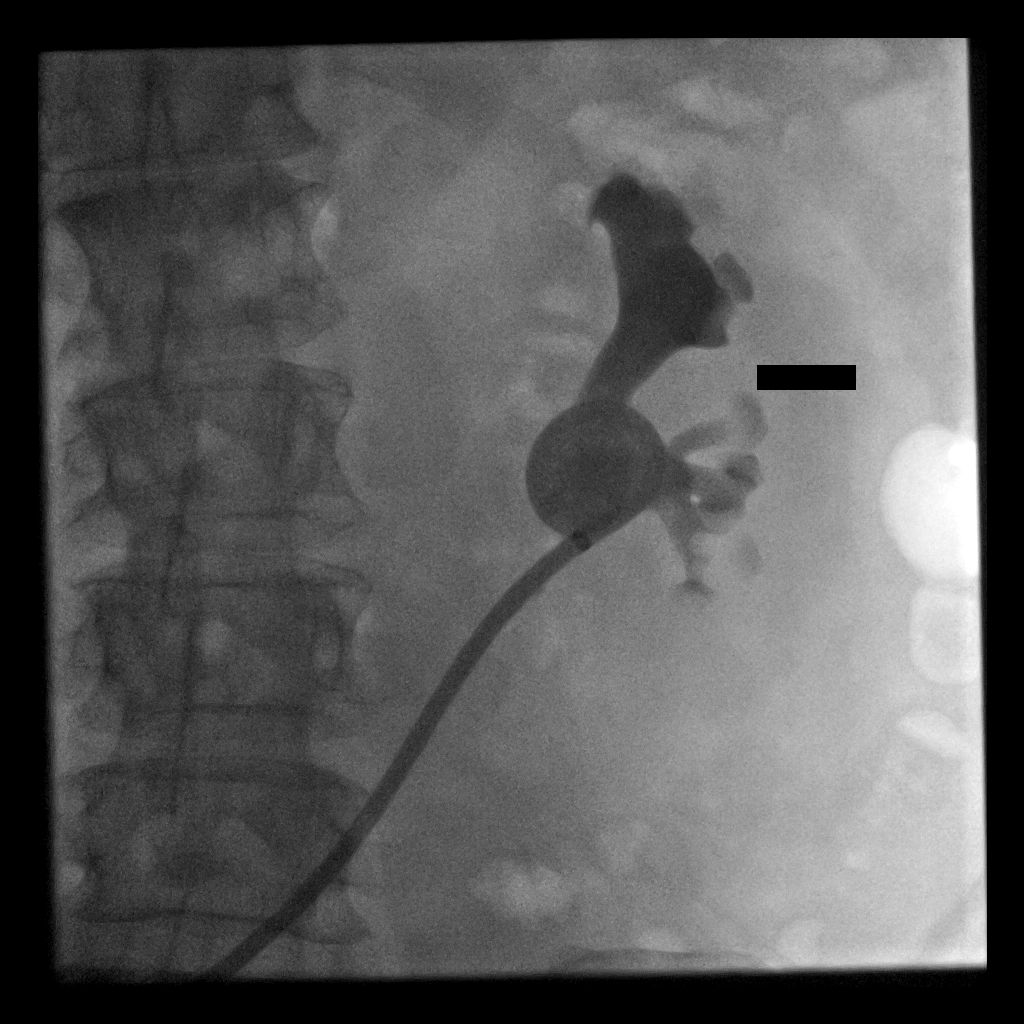

[4 of 4 positions shown; findings below may reference images not displayed]

MEDICATIONS:
2 g Ancef; The antibiotic was administered in an appropriate time
frame prior to skin puncture.

ANESTHESIA/SEDATION:
None

CONTRAST:  None-administered into the collecting system(s)

FLUOROSCOPY TIME:  Fluoroscopy Time: 1 minutes 0 seconds (038 mGy).

COMPLICATIONS:
None

PROCEDURE:
Informed written consent was obtained from the patient after a
thorough discussion of the procedural risks, benefits and
alternatives. All questions were addressed. Maximal Sterile Barrier
Technique was utilized including caps, mask, sterile gowns, sterile
gloves, sterile drape, hand hygiene and skin antiseptic. A timeout
was performed prior to the initiation of the procedure.

Patient positioned supine position on the fluoroscopy table. The
right lower quadrant and the ostomy were prepped and draped in the
usual sterile fashion, including the indwelling drain.

Scout images were acquired.

Contrast was infused gently through the indwelling tube partially
opacifying the right collecting system.

Modified Seldinger technique was then used to replace the 12 French
45 cm drain catheter. An Amplatz wire was used for the exchange, for
assistance with straightening the initial curvature in the pelvis.

Final image was stored.

Patient tolerated the procedure well and remained hemodynamically
stable throughout.

No complications were encountered and no significant blood loss.
IMPRESSION: Status post routine exchange of left nephroureteral drain.

## 2020-06-24 DIAGNOSIS — N10 Acute tubulo-interstitial nephritis: Secondary | ICD-10-CM | POA: Diagnosis not present

## 2020-06-28 DIAGNOSIS — R1032 Left lower quadrant pain: Secondary | ICD-10-CM | POA: Diagnosis not present

## 2020-09-29 DIAGNOSIS — Z8546 Personal history of malignant neoplasm of prostate: Secondary | ICD-10-CM | POA: Diagnosis not present

## 2020-09-29 DIAGNOSIS — K449 Diaphragmatic hernia without obstruction or gangrene: Secondary | ICD-10-CM | POA: Diagnosis not present

## 2020-09-29 DIAGNOSIS — Z936 Other artificial openings of urinary tract status: Secondary | ICD-10-CM | POA: Diagnosis not present

## 2020-09-29 DIAGNOSIS — R911 Solitary pulmonary nodule: Secondary | ICD-10-CM | POA: Diagnosis not present

## 2020-09-29 DIAGNOSIS — R39198 Other difficulties with micturition: Secondary | ICD-10-CM | POA: Diagnosis not present

## 2020-09-29 DIAGNOSIS — Z8551 Personal history of malignant neoplasm of bladder: Secondary | ICD-10-CM | POA: Diagnosis not present

## 2020-09-29 DIAGNOSIS — N130 Hydronephrosis with ureteropelvic junction obstruction: Secondary | ICD-10-CM | POA: Diagnosis not present

## 2020-09-29 DIAGNOSIS — R3981 Functional urinary incontinence: Secondary | ICD-10-CM | POA: Diagnosis not present

## 2020-09-30 DIAGNOSIS — R8271 Bacteriuria: Secondary | ICD-10-CM | POA: Diagnosis not present

## 2020-09-30 DIAGNOSIS — N130 Hydronephrosis with ureteropelvic junction obstruction: Secondary | ICD-10-CM | POA: Diagnosis not present

## 2020-09-30 DIAGNOSIS — Z8546 Personal history of malignant neoplasm of prostate: Secondary | ICD-10-CM | POA: Diagnosis not present

## 2020-09-30 DIAGNOSIS — Z8551 Personal history of malignant neoplasm of bladder: Secondary | ICD-10-CM | POA: Diagnosis not present

## 2020-10-01 DIAGNOSIS — Z23 Encounter for immunization: Secondary | ICD-10-CM | POA: Diagnosis not present

## 2020-10-14 ENCOUNTER — Emergency Department (HOSPITAL_COMMUNITY)
Admission: EM | Admit: 2020-10-14 | Discharge: 2020-10-15 | Disposition: A | Discharge status: Home / Self Care | Payer: Medicare Other | Attending: Emergency Medicine | Admitting: Emergency Medicine

## 2020-10-14 ENCOUNTER — Encounter (HOSPITAL_COMMUNITY): Payer: Self-pay | Admitting: Emergency Medicine

## 2020-10-14 ENCOUNTER — Other Ambulatory Visit: Payer: Self-pay

## 2020-10-14 ENCOUNTER — Emergency Department (HOSPITAL_COMMUNITY): Payer: Medicare Other

## 2020-10-14 DIAGNOSIS — R109 Unspecified abdominal pain: Secondary | ICD-10-CM | POA: Diagnosis not present

## 2020-10-14 DIAGNOSIS — K219 Gastro-esophageal reflux disease without esophagitis: Secondary | ICD-10-CM | POA: Insufficient documentation

## 2020-10-14 DIAGNOSIS — R000 Tachycardia, unspecified: Secondary | ICD-10-CM | POA: Diagnosis not present

## 2020-10-14 DIAGNOSIS — Z8546 Personal history of malignant neoplasm of prostate: Secondary | ICD-10-CM | POA: Insufficient documentation

## 2020-10-14 DIAGNOSIS — Z85828 Personal history of other malignant neoplasm of skin: Secondary | ICD-10-CM | POA: Diagnosis not present

## 2020-10-14 DIAGNOSIS — Z87891 Personal history of nicotine dependence: Secondary | ICD-10-CM | POA: Insufficient documentation

## 2020-10-14 DIAGNOSIS — N390 Urinary tract infection, site not specified: Secondary | ICD-10-CM

## 2020-10-14 DIAGNOSIS — M545 Low back pain, unspecified: Secondary | ICD-10-CM | POA: Diagnosis not present

## 2020-10-14 DIAGNOSIS — R1084 Generalized abdominal pain: Secondary | ICD-10-CM | POA: Diagnosis not present

## 2020-10-14 DIAGNOSIS — R002 Palpitations: Secondary | ICD-10-CM | POA: Diagnosis not present

## 2020-10-14 LAB — COMPREHENSIVE METABOLIC PANEL
ALT: 19 U/L (ref 0–44)
AST: 21 U/L (ref 15–41)
Albumin: 3.7 g/dL (ref 3.5–5.0)
Alkaline Phosphatase: 42 U/L (ref 38–126)
Anion gap: 10 (ref 5–15)
BUN: 20 mg/dL (ref 8–23)
CO2: 15 mmol/L — ABNORMAL LOW (ref 22–32)
Calcium: 7.8 mg/dL — ABNORMAL LOW (ref 8.9–10.3)
Chloride: 115 mmol/L — ABNORMAL HIGH (ref 98–111)
Creatinine, Ser: 0.97 mg/dL (ref 0.61–1.24)
GFR, Estimated: >60 mL/min (ref >60)
Glucose, Bld: 120 mg/dL — ABNORMAL HIGH (ref 70–99)
Potassium: 3.7 mmol/L (ref 3.5–5.1)
Sodium: 140 mmol/L (ref 135–145)
Total Bilirubin: 0.7 mg/dL (ref 0.3–1.2)
Total Protein: 6.2 g/dL — ABNORMAL LOW (ref 6.5–8.1)

## 2020-10-14 LAB — URINALYSIS, ROUTINE W REFLEX MICROSCOPIC
Bilirubin Urine: NEGATIVE
Glucose, UA: NEGATIVE mg/dL
Ketones, ur: NEGATIVE mg/dL
Nitrite: POSITIVE — AB
Protein, ur: >=300 mg/dL — AB
RBC / HPF: >50 RBC/hpf — ABNORMAL HIGH (ref 0–5)
Specific Gravity, Urine: 1.027 (ref 1.005–1.030)
WBC, UA: >50 WBC/hpf — ABNORMAL HIGH (ref 0–5)
pH: 7 (ref 5.0–8.0)

## 2020-10-14 LAB — CBC
HCT: 51.1 % (ref 39.0–52.0)
Hemoglobin: 17.2 g/dL — ABNORMAL HIGH (ref 13.0–17.0)
MCH: 29.2 pg (ref 26.0–34.0)
MCHC: 33.7 g/dL (ref 30.0–36.0)
MCV: 86.6 fL (ref 80.0–100.0)
Platelets: 164 10*3/uL (ref 150–400)
RBC: 5.9 MIL/uL — ABNORMAL HIGH (ref 4.22–5.81)
RDW: 13.7 % (ref 11.5–15.5)
WBC: 16.8 10*3/uL — ABNORMAL HIGH (ref 4.0–10.5)
nRBC: 0 % (ref 0.0–0.2)

## 2020-10-14 LAB — LIPASE, BLOOD: Lipase: 23 U/L (ref 11–51)

## 2020-10-14 MED ORDER — SODIUM CHLORIDE 0.9 % IV SOLN
1.0000 g | Freq: Once | INTRAVENOUS | Status: AC
  Administered 2020-10-15: 1 g via INTRAVENOUS
  Filled 2020-10-14: qty 10

## 2020-10-14 MED ORDER — IOHEXOL 300 MG/ML  SOLN
100.0000 mL | Freq: Once | INTRAMUSCULAR | Status: AC | PRN
  Administered 2020-10-14: 100 mL via INTRAVENOUS

## 2020-10-14 MED ORDER — CEFDINIR 300 MG PO CAPS: 300.0000 mg | ORAL_CAPSULE | Freq: Two times a day (BID) | ORAL | 0 refills | Status: DC

## 2020-10-14 MED ORDER — SODIUM CHLORIDE 0.9 % IV BOLUS
500.0000 mL | Freq: Once | INTRAVENOUS | Status: AC
  Administered 2020-10-14: 500 mL via INTRAVENOUS

## 2020-10-14 MED ORDER — SODIUM CHLORIDE (PF) 0.9 % IJ SOLN
INTRAMUSCULAR | Status: AC
  Filled 2020-10-14: qty 50

## 2020-10-14 NOTE — ED Triage Notes (Addendum)
Pt c/o constant sharp abdominal pain that began earlier today. Pt took ibuprofen and tylenol with no relief. Hx of urostomy and two hernias from urostomy. Pt states his pain is coming from his hernias. Pt also reports palpations and back pain when his abdomen was "hurting the worst". Denies N/V/D, shob, chest pain, and urinary symptoms.

## 2020-10-14 NOTE — ED Provider Notes (Signed)
[facility] DEPT Provider Note   CSN: [id number] Arrival date & time: 10/14/20  2024     History Chief Complaint  Patient presents with  . Abdominal Pain  . Palpitations  . Back Pain    Kristopher Thompson is a 69 y.o. male.  The history is provided by the patient and medical records.  Abdominal Pain Palpitations Associated symptoms: back pain   Back Pain Associated symptoms: abdominal pain    Kristopher Thompson is a 69 y.o. male who presents to the Emergency Department complaining of abdominal pain.  He has a hx/o   He developed sudden onset sharp abdominal pain about 430pm today.  Pain was initially generalized but now is localized to the RLQ.  Pain is worse with movement.  No prior similar sxs.  He wears a hernia belt to help with his parastomal hernia but was not wearing the belt when the pain began.    He sees Dr. [name] with Urology.   Denies fever, N/V, diarrhea.  No recent illnesses or medication changes.  No change in urine quality.      Past Medical History:  Diagnosis Date  . At risk for sleep apnea    12-25-2017   STOP-BANG SCORE= 5   --- SENT TO PCP  . 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   moderate atypia - right low back  . Atypical nevus 04/04/2007   moderate to marked - right upper back (wider shave)  . Atypical nevus 04/04/2007   moderate atypia - center chest (wider shave)  . Atypical nevus 04/04/2007   slight atypia - right thigh  . Atypical nevus 11/29/2011   mild atypia - center upper back  . Atypical nevus 11/29/2011   mild atypia - center chest  . 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 Colostomy in place [facility])    since 08-08-2017-- per pt 12-25-2017 reddness around stoma  . GERD (gastroesophageal reflux disease)   . H/O hiatal hernia   . History of sepsis 09/2017   dx bacteremia due to klebsiella pneumoniae,  post op  intraabdominal abscess  . Prostate cancer [facility]) urologist-- dr [name]   10-02-2012 s/p  prostatectomy-- Stage T1c  . RBBB   . Renal disorder    pt. denies  . Squamous cell carcinoma of skin 05/22/2013   left cheek - CX3 + 5FU  . Wears glasses     Patient Active Problem List   Diagnosis Date Noted  . Lower urinary tract infectious disease   . Prolonged QT interval 12/07/2018  . Hydronephrosis 12/07/2018  . Pyelonephritis 06/15/2018  . Leukocytosis 06/15/2018  . Hyperkalemia 06/15/2018  . AKI (acute kidney injury) ([place]) 06/15/2018  . 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 Candida infection   . Postoperative intra-abdominal abscess   . Abscess   . Fever 10/09/2017  . Bacteremia due to Klebsiella pneumoniae 10/09/2017  . Urine leakage from surgical incision   . Sepsis ([place]) 10/04/2017  . GERD (gastroesophageal reflux disease) 08/11/2017  . Obesity (BMI 30-39.9) 08/11/2017  . 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 08/08/2017 08/08/2017    Past Surgical History:  Procedure Laterality Date  . ABDOMINAL SURGERY    . APPENDECTOMY  1972  . CHOLECYSTECTOMY  1985  . COLOSTOMY REVERSAL N/A 01/08/2018   Procedure: COLOSTOMY REVERSAL;  Surgeon: Leighton Ruff, MD;  Location: [facility] ORS;  Service: General;  Laterality: N/A;  . CYSTOSCOPY WITH RETROGRADE PYELOGRAM, URETEROSCOPY AND STENT PLACEMENT Right 06/10/2017   Procedure: CYSTOSCOPY WITH RIGHT URETEROSCOPY WITH RIGHT STENT PLACEMENT;  Surgeon: Raynelle Bring, MD;  Location: [facility] ORS;  Service: Urology;  Laterality: Right;  . EUS N/A 04/16/2018   Procedure: FULL UPPER ENDOSCOPIC ULTRASOUND (EUS) RADIAL;  Surgeon: Arta Silence, MD;  Location: [facility] END[facility];  Service: Endoscopy;  Laterality: N/A;  . FLEXIBLE SIGMOIDOSCOPY N/A 12/13/2017   Procedure: FLEXIBLE SIGMOIDOSCOPY;  Surgeon: Leighton Ruff, MD;  Location: [facility] END[facility];   Service: Endoscopy;  Laterality: N/A;  . ILEO CONDUIT    . IR CATHETER TUBE CHANGE  12/13/2017  . IR CATHETER TUBE CHANGE  01/17/2018  . IR CATHETER TUBE CHANGE  02/28/2018  . IR CATHETER TUBE CHANGE  07/18/2018  . IR CATHETER TUBE CHANGE  08/22/2018  . IR CATHETER TUBE CHANGE  10/31/2018  . IR CONVERT LEFT NEPHROSTOMY TO NEPHROURETERAL CATH  10/24/2017  . IR EXT NEPHROURETERAL CATH EXCHANGE  05/19/2018  . IR EXT NEPHROURETERAL CATH EXCHANGE  06/16/2018  . IR NEPHRO TUBE REMOV/FL  10/24/2017  . IR NEPHROSTOGRAM LEFT THRU EXISTING ACCESS  12/05/2018  . IR NEPHROSTOMY PLACEMENT LEFT  10/07/2017  . IR NEPHROSTOMY TUBE CHANGE  04/11/2018  . ROBOT ASSISTED LAPAROSCOPIC RADICAL PROSTATECTOMY  10/02/2012   Procedure: ROBOTIC ASSISTED LAPAROSCOPIC RADICAL PROSTATECTOMY LEVEL 2;  Surgeon: Dutch Gray, MD;  Location: [facility];  Service: Urology;  Laterality: N/A;  . ROBOTIC ASSISTED LAPAROSCOPIC BLADDER DIVERTICULECTOMY N/A 08/08/2017   Procedure: XI ROBOTIC ASSISTED LAPAROSCOPIC RADICAL CYSTECTOMY COVERTED TO OPEN PELVIC LYMPHADNECTOMY BILATERAL AND ILEAL CONDUIT URINARY DIVERSION;  Surgeon: Raynelle Bring, MD;  Location: [facility];  Service: Urology;  Laterality: N/A;  . TRANSURETHRAL RESECTION OF BLADDER TUMOR  06/10/2017   Procedure: TRANSURETHRAL RESECTION OF BLADDER TUMOR (TURBT);  Surgeon: Raynelle Bring, MD;  Location: [facility];  Service: Urology;;  . XI ROBOT ABDOMINAL PERINEAL RESECTION N/A 08/08/2017   Procedure: REPAIR OF RECTAL TEAR POSSIBLE PARTIAL PROCTECTOMY, CREATION OF  OSTOMY;  Surgeon: Leighton Ruff, MD;  Location: [facility];  Service: General;  Laterality: N/A;       Family History  Problem Relation Age of Onset  . Lung cancer Mother   . Hypertension Father   . Colon cancer Other   . CAD Neg Hx   . Diabetes Neg Hx   . Stroke Neg Hx     Social History   Tobacco Use  . Smoking status: Former Smoker    Years: 9.00    Types: Cigarettes    Quit date: 12/25/1979    Years since quitting: 40.8    . Smokeless tobacco: Never Used  Vaping Use  . Vaping Use: Never used  Substance Use Topics  . Alcohol use: Yes    Comment: occasional  . Drug use: No    Home Medications Prior to Admission medications   Medication Sig Start Date End Date Taking? Authorizing Provider  acetaminophen (TYLENOL) 500 MG tablet Take 1,000 mg by mouth every 8 (eight) hours as needed for mild pain or headache.     [provider]  bisacodyl (DULCOLAX) 5 MG EC tablet Take 2 tablets (10 mg total) by mouth daily as needed for moderate constipation. 12/09/18   Eugenie Filler, MD  cefdinir (OMNICEF) 300 MG capsule Take 1 capsule (300 mg total) by mouth 2 (two) times daily. 10/14/20   Quintella Reichert, MD  D-MANNOSE PO Take 1  tablet by mouth daily.    [provider]  esomeprazole (NEXIUM) 20 MG capsule Take 20 mg by mouth every 3 (three) days.     [provider]  ibuprofen (ADVIL,MOTRIN) 200 MG tablet Take 600 mg by mouth daily as needed for headache or moderate pain.     [provider]  senna (SENOKOT) 8.6 MG TABS tablet Take 1 tablet by mouth daily as needed for mild constipation.    [provider]    Allergies    Demerol [meperidine]  Review of Systems   Review of Systems  Cardiovascular: Positive for palpitations.  Gastrointestinal: Positive for abdominal pain.  Musculoskeletal: Positive for back pain.  All other systems reviewed and are negative.   Physical Exam Updated Vital Signs BP 128/88   Pulse 95   Temp 98.4 F (36.9 C) (Oral)   Resp 19   Ht 6' (1.829 m)   Wt 115.7 kg   SpO2 95%   BMI 34.58 kg/m   Physical Exam Vitals and nursing note reviewed.  Constitutional:      Appearance: He is well-developed.  HENT:     Head: Normocephalic and atraumatic.  Cardiovascular:     Rate and Rhythm: Normal rate and regular rhythm.     Heart sounds: No murmur heard.   Pulmonary:     Effort: Pulmonary effort is normal. No respiratory distress.      Breath sounds: Normal breath sounds.  Abdominal:     Palpations: Abdomen is soft.     Tenderness: There is no guarding or rebound.     Comments: Urostomy in the right lower quadrant with pouch in place, yellow urine in the bag. Mild midline abdominal tenderness with soft ventral hernia  Musculoskeletal:        General: No tenderness.  Skin:    General: Skin is warm and dry.  Neurological:     Mental Status: He is alert and oriented to person, place, and time.  Psychiatric:        Behavior: Behavior normal.     ED Results / Procedures / Treatments   Labs (all labs ordered are listed, but only abnormal results are displayed) Labs Reviewed  COMPREHENSIVE METABOLIC PANEL - Abnormal; Notable for the following components:      Result Value   Chloride 115 (*)    CO2 15 (*)    Glucose, Bld 120 (*)    Calcium 7.8 (*)    Total Protein 6.2 (*)    All other components within normal limits  CBC - Abnormal; Notable for the following components:   WBC 16.8 (*)    RBC 5.90 (*)    Hemoglobin 17.2 (*)    All other components within normal limits  URINALYSIS, ROUTINE W REFLEX MICROSCOPIC - Abnormal; Notable for the following components:   APPearance TURBID (*)    Hgb urine dipstick LARGE (*)    Protein, ur >=300 (*)    Nitrite POSITIVE (*)    Leukocytes,Ua MODERATE (*)    RBC / HPF >50 (*)    WBC, UA >50 (*)    Bacteria, UA FEW (*)    All other components within normal limits  URINE CULTURE  LIPASE, BLOOD    EKG EKG Interpretation  Date/Time:  Friday October 14 2020 20:55:51 EST Ventricular Rate:  104 PR Interval:    QRS Duration: 155 QT Interval:  389 QTC Calculation: 512 R Axis:   -70 Text Interpretation: Sinus tachycardia RBBB and LAFB Lateral infarct, acute  12 Lead; [place] No significant change since last tracing Confirmed by Isla Pence [phone]) on 10/14/2020 9:01:10 PM   Radiology CT Abdomen Pelvis W Contrast  Result Date: 10/14/2020 CLINICAL DATA:  Abdominal  pain. EXAM: CT ABDOMEN AND PELVIS WITH CONTRAST TECHNIQUE: Multidetector CT imaging of the abdomen and pelvis was performed using the standard protocol following bolus administration of intravenous contrast. CONTRAST:  112mL OMNIPAQUE IOHEXOL 300 MG/ML  SOLN COMPARISON:  CT dated September 29, 2020 FINDINGS: Lower chest: The lung bases are clear. The heart size is normal. Hepatobiliary: The liver is normal. Status post cholecystectomy.There is no biliary ductal dilation. Pancreas: Again noted are findings chronic pancreatitis. Is a stable small cystic lesion in the pancreatic body. Spleen: Unremarkable. Adrenals/Urinary Tract: --Adrenal glands: Unremarkable. --Right kidney/ureter: No hydronephrosis or radiopaque kidney stones. --Left kidney/ureter: There is severe left-sided hydroureteronephrosis which has progressed since the prior study. The transition point appears to be at the level of the ileal conduit. There is new fat stranding about the left kidney with hyperenhancement of the ureter. --Urinary bladder: The patient is status post cystectomy. Stomach/Bowel: --Stomach/Duodenum: There is a moderate-sized hiatal hernia. --Small bowel: Unremarkable. --Colon: Rectosigmoid diverticulosis without acute inflammation. There is an above average amount of stool in the colon. --Appendix: Not visualized. No right lower quadrant inflammation or free fluid. Vascular/Lymphatic: Atherosclerotic calcification is present within the non-aneurysmal abdominal aorta, without hemodynamically significant stenosis. --No retroperitoneal lymphadenopathy. --No mesenteric lymphadenopathy. --No pelvic or inguinal lymphadenopathy. Reproductive: The patient is status post prostatectomy. Other: Again noted is a large peristomal hernia in addition to multiple ventral wall hernias containing loops of bowel. There is no evidence for an obstruction. Musculoskeletal. No acute displaced fractures. IMPRESSION: 1. Severe left-sided  hydroureteronephrosis which has progressed since the prior study. The transition point appears to be at the level of the ileal conduit. There is new fat stranding about the left kidney with hyperenhancement of the ureter. Findings raise concern for an underlying urinary tract infection. 2. Multiple additional chronic findings are noted, not substantially changed from prior study. Aortic Atherosclerosis (ICD10-I70.0). Electronically Signed   By: Constance Holster M.D.   On: 10/14/2020 22:26    Procedures Procedures (including critical care time)  Medications Ordered in ED Medications  iohexol (OMNIPAQUE) 300 MG/ML solution 100 mL (100 mLs Intravenous Contrast Given 10/14/20 2205)  sodium chloride (PF) 0.9 % injection (  Given 10/14/20 2222)  sodium chloride 0.9 % bolus 500 mL (0 mLs Intravenous Stopped 10/14/20 2340)  cefTRIAXone (ROCEPHIN) 1 g in sodium chloride 0.9 % 100 mL IVPB (0 g Intravenous Stopped 10/15/20 0037)    ED Course  I have reviewed the triage vital signs and the nursing notes.  Pertinent labs & imaging results that were available during my care of the patient were reviewed by me and considered in my medical decision making (see chart for details).    MDM Rules/Calculators/A&P                         Patient with history of multiple abdominal wall hernias, your ostomy here for evaluation of severe abdominal pain. He has mild tenderness on examination without peritoneal findings. CBC with leukocytosis. BMP with stable renal function. Bicarb is low but anion gap is within normal limits, presentation is not consistent with acute acidosis, mesenteric ischemia. Question hyperventilation secondary to pain prior to ED arrival resulting in low bicarb. UA is nitrite positive, numerous WBC, RBC. This is difficult to interpret in the setting  of his urostomy, will send a culture. CT scan was obtained given his pain, demonstrates worsening left Hydro ureter as well as stranding concerning for  possible UTI. Patient states that these symptoms are different than his prior UTIs. Discussed with on-call urologist, Dr. Al Pimple - recommends treating for possible UTI with close urology follow-up. On reassessment patient's pain is nearly resolved. He is able to drink fluids, ambulate without difficulty. Discussed home care for UTI, abdominal pain. Discussed close outpatient follow-up and return precautions.  Final Clinical Impression(s) / ED Diagnoses Final diagnoses:  Generalized abdominal pain  Acute UTI    Rx / DC Orders ED Discharge Orders         Ordered    cefdinir (OMNICEF) 300 MG capsule  2 times daily        10/14/20 2356           Quintella Reichert, MD 10/15/20 [phone]

## 2020-10-15 DIAGNOSIS — N390 Urinary tract infection, site not specified: Secondary | ICD-10-CM | POA: Diagnosis not present

## 2020-10-15 NOTE — ED Notes (Signed)
Pt tolerated 8 oz of water and ambulated to restroom independently

## 2020-10-21 DIAGNOSIS — R8271 Bacteriuria: Secondary | ICD-10-CM | POA: Diagnosis not present

## 2020-10-21 DIAGNOSIS — N10 Acute tubulo-interstitial nephritis: Secondary | ICD-10-CM | POA: Diagnosis not present

## 2020-10-21 DIAGNOSIS — Z8546 Personal history of malignant neoplasm of prostate: Secondary | ICD-10-CM | POA: Diagnosis not present

## 2020-10-21 DIAGNOSIS — Z8551 Personal history of malignant neoplasm of bladder: Secondary | ICD-10-CM | POA: Diagnosis not present

## 2020-11-08 ENCOUNTER — Encounter (HOSPITAL_COMMUNITY): Payer: Self-pay

## 2020-11-08 ENCOUNTER — Emergency Department (HOSPITAL_COMMUNITY)
Admission: EM | Admit: 2020-11-08 | Discharge: 2020-11-08 | Disposition: A | Discharge status: Home / Self Care | Payer: Medicare Other | Attending: Emergency Medicine | Admitting: Emergency Medicine

## 2020-11-08 ENCOUNTER — Other Ambulatory Visit: Payer: Self-pay

## 2020-11-08 DIAGNOSIS — R82998 Other abnormal findings in urine: Secondary | ICD-10-CM | POA: Insufficient documentation

## 2020-11-08 DIAGNOSIS — R109 Unspecified abdominal pain: Secondary | ICD-10-CM | POA: Diagnosis not present

## 2020-11-08 DIAGNOSIS — R519 Headache, unspecified: Secondary | ICD-10-CM | POA: Diagnosis not present

## 2020-11-08 DIAGNOSIS — I959 Hypotension, unspecified: Secondary | ICD-10-CM | POA: Diagnosis not present

## 2020-11-08 DIAGNOSIS — R42 Dizziness and giddiness: Secondary | ICD-10-CM | POA: Insufficient documentation

## 2020-11-08 DIAGNOSIS — R8271 Bacteriuria: Secondary | ICD-10-CM | POA: Diagnosis not present

## 2020-11-08 DIAGNOSIS — Z5321 Procedure and treatment not carried out due to patient leaving prior to being seen by health care provider: Secondary | ICD-10-CM | POA: Diagnosis not present

## 2020-11-08 DIAGNOSIS — M791 Myalgia, unspecified site: Secondary | ICD-10-CM | POA: Diagnosis not present

## 2020-11-08 LAB — URINALYSIS, ROUTINE W REFLEX MICROSCOPIC
Bilirubin Urine: NEGATIVE
Glucose, UA: NEGATIVE mg/dL
Ketones, ur: NEGATIVE mg/dL
Nitrite: POSITIVE — AB
Protein, ur: 100 mg/dL — AB
Specific Gravity, Urine: 1.013 (ref 1.005–1.030)
WBC, UA: >50 WBC/hpf — ABNORMAL HIGH (ref 0–5)
pH: 5 (ref 5.0–8.0)

## 2020-11-08 NOTE — ED Triage Notes (Signed)
Patient c/o left flank pain that started at 0300 today. Patient states pain is much better now. Patient also c/o generalized body aches and states that his urine was very cloudy this AM. Patient states he drank a lot of water today and urine looks some better. Patient also c/o headache and dizziness.

## 2020-11-08 NOTE — ED Notes (Signed)
Called 3x for room placement. Eloped from waiting area.  

## 2020-11-09 ENCOUNTER — Other Ambulatory Visit: Payer: Self-pay

## 2020-11-09 ENCOUNTER — Emergency Department (HOSPITAL_COMMUNITY)
Admission: EM | Admit: 2020-11-09 | Discharge: 2020-11-09 | Disposition: A | Discharge status: Home / Self Care | Payer: Medicare Other | Attending: Emergency Medicine | Admitting: Emergency Medicine

## 2020-11-09 DIAGNOSIS — Z87891 Personal history of nicotine dependence: Secondary | ICD-10-CM | POA: Diagnosis not present

## 2020-11-09 DIAGNOSIS — Z8651 Personal history of combat and operational stress reaction: Secondary | ICD-10-CM | POA: Diagnosis not present

## 2020-11-09 DIAGNOSIS — N390 Urinary tract infection, site not specified: Secondary | ICD-10-CM | POA: Insufficient documentation

## 2020-11-09 DIAGNOSIS — R109 Unspecified abdominal pain: Secondary | ICD-10-CM | POA: Diagnosis present

## 2020-11-09 DIAGNOSIS — Z8546 Personal history of malignant neoplasm of prostate: Secondary | ICD-10-CM | POA: Diagnosis not present

## 2020-11-09 LAB — CBC
HCT: 51.8 % (ref 39.0–52.0)
Hemoglobin: 17.2 g/dL — ABNORMAL HIGH (ref 13.0–17.0)
MCH: 28.8 pg (ref 26.0–34.0)
MCHC: 33.2 g/dL (ref 30.0–36.0)
MCV: 86.8 fL (ref 80.0–100.0)
Platelets: 152 10*3/uL (ref 150–400)
RBC: 5.97 MIL/uL — ABNORMAL HIGH (ref 4.22–5.81)
RDW: 13.9 % (ref 11.5–15.5)
WBC: 9.9 10*3/uL (ref 4.0–10.5)
nRBC: 0 % (ref 0.0–0.2)

## 2020-11-09 LAB — URINALYSIS, ROUTINE W REFLEX MICROSCOPIC
Bilirubin Urine: NEGATIVE
Glucose, UA: NEGATIVE mg/dL
Ketones, ur: NEGATIVE mg/dL
Nitrite: POSITIVE — AB
Protein, ur: 100 mg/dL — AB
Specific Gravity, Urine: 1.014 (ref 1.005–1.030)
WBC, UA: >50 WBC/hpf — ABNORMAL HIGH (ref 0–5)
pH: 6 (ref 5.0–8.0)

## 2020-11-09 LAB — COMPREHENSIVE METABOLIC PANEL
ALT: 18 U/L (ref 0–44)
AST: 14 U/L — ABNORMAL LOW (ref 15–41)
Albumin: 4.2 g/dL (ref 3.5–5.0)
Alkaline Phosphatase: 47 U/L (ref 38–126)
Anion gap: 9 (ref 5–15)
BUN: 21 mg/dL (ref 8–23)
CO2: 25 mmol/L (ref 22–32)
Calcium: 9.3 mg/dL (ref 8.9–10.3)
Chloride: 107 mmol/L (ref 98–111)
Creatinine, Ser: 1.22 mg/dL (ref 0.61–1.24)
GFR, Estimated: >60 mL/min (ref >60)
Glucose, Bld: 91 mg/dL (ref 70–99)
Potassium: 4.4 mmol/L (ref 3.5–5.1)
Sodium: 141 mmol/L (ref 135–145)
Total Bilirubin: 0.8 mg/dL (ref 0.3–1.2)
Total Protein: 7.6 g/dL (ref 6.5–8.1)

## 2020-11-09 LAB — LIPASE, BLOOD: Lipase: 22 U/L (ref 11–51)

## 2020-11-09 MED ORDER — SODIUM CHLORIDE 0.9 % IV BOLUS
500.0000 mL | Freq: Once | INTRAVENOUS | Status: AC
  Administered 2020-11-09: 13:00:00 500 mL via INTRAVENOUS

## 2020-11-09 MED ORDER — CIPROFLOXACIN HCL 500 MG PO TABS: 500.0000 mg | ORAL_TABLET | Freq: Two times a day (BID) | ORAL | 0 refills | Status: DC

## 2020-11-09 MED ORDER — CIPROFLOXACIN IN D5W 400 MG/200ML IV SOLN
400.0000 mg | Freq: Once | INTRAVENOUS | Status: AC
  Administered 2020-11-09: 13:00:00 400 mg via INTRAVENOUS
  Filled 2020-11-09: qty 200

## 2020-11-09 NOTE — ED Triage Notes (Signed)
Pt reports being seen by doctor yesterday, dx with kidney infection, ileal conduit.  Denies abdominal pain at this time.

## 2020-11-09 NOTE — Discharge Instructions (Addendum)
Make sure you are drinking plenty of fluids.  See your doctor for checkup in 3 to 5 days, and as needed.

## 2020-11-09 NOTE — ED Provider Notes (Signed)
[facility] DEPT Provider Note   CSN: [id number] Arrival date & time: 11/09/20  0848     History Chief Complaint  Patient presents with  . Abdominal Pain    Kristopher Thompson is a 69 y.o. male.  HPI Is here with concern of "cloudy urine," which worsens during the day.  Onset of the symptoms, several days ago.  Yesterday he was having left-sided abdomen and left flank pain, went to his urologist, who sent him to the ED, he checked in but left before evaluation.  Urinalysis obtained yesterday was abnormal.  Results are in the electronic medical record.  The patient denies fever, chills, shortness of breath, cough, focal weakness or paresthesia.  He is taking his usual medications.  He denies vomiting.  He denies left flank or abdominal pain today.  There are no other known modifying factors.    Past Medical History:  Diagnosis Date  . At risk for sleep apnea    12-25-2017   STOP-BANG SCORE= 5   --- SENT TO PCP  . 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   moderate atypia - right low back  . Atypical nevus 04/04/2007   moderate to marked - right upper back (wider shave)  . Atypical nevus 04/04/2007   moderate atypia - center chest (wider shave)  . Atypical nevus 04/04/2007   slight atypia - right thigh  . Atypical nevus 11/29/2011   mild atypia - center upper back  . Atypical nevus 11/29/2011   mild atypia - center chest  . 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 Colostomy in place [facility])    since 08-08-2017-- per pt 12-25-2017 reddness around stoma  . GERD (gastroesophageal reflux disease)   . H/O hiatal hernia   . History of sepsis 09/2017   dx bacteremia due to klebsiella pneumoniae,  post op intraabdominal abscess  . Prostate cancer [facility]) urologist-- dr [name]   10-02-2012 s/p  prostatectomy-- Stage T1c  . RBBB   . Renal disorder    pt. denies  . Squamous cell  carcinoma of skin 05/22/2013   left cheek - CX3 + 5FU  . Wears glasses     Patient Active Problem List   Diagnosis Date Noted  . Lower urinary tract infectious disease   . Prolonged QT interval 12/07/2018  . Hydronephrosis 12/07/2018  . Pyelonephritis 06/15/2018  . Leukocytosis 06/15/2018  . Hyperkalemia 06/15/2018  . AKI (acute kidney injury) ([place]) 06/15/2018  . 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 Candida infection   . Postoperative intra-abdominal abscess   . Abscess   . Fever 10/09/2017  . Bacteremia due to Klebsiella pneumoniae 10/09/2017  . Urine leakage from surgical incision   . Sepsis ([place]) 10/04/2017  . GERD (gastroesophageal reflux disease) 08/11/2017  . Obesity (BMI 30-39.9) 08/11/2017  . 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 08/08/2017 08/08/2017    Past Surgical History:  Procedure Laterality Date  . ABDOMINAL SURGERY    . APPENDECTOMY  1972  . CHOLECYSTECTOMY  1985  . COLOSTOMY REVERSAL N/A 01/08/2018   Procedure: COLOSTOMY REVERSAL;  Surgeon: Leighton Ruff, MD;  Location: [facility] ORS;  Service: General;  Laterality: N/A;  . CYSTOSCOPY WITH RETROGRADE PYELOGRAM, URETEROSCOPY AND STENT PLACEMENT Right 06/10/2017   Procedure: CYSTOSCOPY WITH RIGHT URETEROSCOPY WITH RIGHT STENT PLACEMENT;  Surgeon: [name],  Konrad Dolores, MD;  Location: [facility];  Service: Urology;  Laterality: Right;  . EUS N/A 04/16/2018   Procedure: FULL UPPER ENDOSCOPIC ULTRASOUND (EUS) RADIAL;  Surgeon: Willis Modena, MD;  Location: [facility];  Service: Endoscopy;  Laterality: N/A;  . FLEXIBLE SIGMOIDOSCOPY N/A 12/13/2017   Procedure: FLEXIBLE SIGMOIDOSCOPY;  Surgeon: Romie Levee, MD;  Location: [facility];  Service: Endoscopy;  Laterality: N/A;  . ILEO CONDUIT    . IR CATHETER TUBE CHANGE  12/13/2017  . IR CATHETER TUBE CHANGE  01/17/2018  . IR CATHETER TUBE CHANGE  02/28/2018  . IR  CATHETER TUBE CHANGE  07/18/2018  . IR CATHETER TUBE CHANGE  08/22/2018  . IR CATHETER TUBE CHANGE  10/31/2018  . IR CONVERT LEFT NEPHROSTOMY TO NEPHROURETERAL CATH  10/24/2017  . IR EXT NEPHROURETERAL CATH EXCHANGE  05/19/2018  . IR EXT NEPHROURETERAL CATH EXCHANGE  06/16/2018  . IR NEPHRO TUBE REMOV/FL  10/24/2017  . IR NEPHROSTOGRAM LEFT THRU EXISTING ACCESS  12/05/2018  . IR NEPHROSTOMY PLACEMENT LEFT  10/07/2017  . IR NEPHROSTOMY TUBE CHANGE  04/11/2018  . ROBOT ASSISTED LAPAROSCOPIC RADICAL PROSTATECTOMY  10/02/2012   Procedure: ROBOTIC ASSISTED LAPAROSCOPIC RADICAL PROSTATECTOMY LEVEL 2;  Surgeon: Crecencio Mc, MD;  Location: [facility];  Service: Urology;  Laterality: N/A;  . ROBOTIC ASSISTED LAPAROSCOPIC BLADDER DIVERTICULECTOMY N/A 08/08/2017   Procedure: XI ROBOTIC ASSISTED LAPAROSCOPIC RADICAL CYSTECTOMY COVERTED TO OPEN PELVIC LYMPHADNECTOMY BILATERAL AND ILEAL CONDUIT URINARY DIVERSION;  Surgeon: Heloise Purpura, MD;  Location: [facility];  Service: Urology;  Laterality: N/A;  . TRANSURETHRAL RESECTION OF BLADDER TUMOR  06/10/2017   Procedure: TRANSURETHRAL RESECTION OF BLADDER TUMOR (TURBT);  Surgeon: Heloise Purpura, MD;  Location: [facility];  Service: Urology;;  . XI ROBOT ABDOMINAL PERINEAL RESECTION N/A 08/08/2017   Procedure: REPAIR OF RECTAL TEAR POSSIBLE PARTIAL PROCTECTOMY, CREATION OF  OSTOMY;  Surgeon: Romie Levee, MD;  Location: [facility];  Service: General;  Laterality: N/A;       Family History  Problem Relation Age of Onset  . Lung cancer Mother   . Hypertension Father   . Colon cancer Other   . CAD Neg Hx   . Diabetes Neg Hx   . Stroke Neg Hx     Social History   Tobacco Use  . Smoking status: Former Smoker    Years: 9.00    Types: Cigarettes    Quit date: 12/25/1979    Years since quitting: 40.9  . Smokeless tobacco: Never Used  Vaping Use  . Vaping Use: Never used  Substance Use Topics  . Alcohol use: Yes    Comment: occasional  . Drug use: No    Home  Medications Prior to Admission medications   Medication Sig Start Date End Date Taking? Authorizing Provider  acetaminophen (TYLENOL) 500 MG tablet Take 1,000 mg by mouth every 8 (eight) hours as needed for mild pain or headache.     [provider]  bisacodyl (DULCOLAX) 5 MG EC tablet Take 2 tablets (10 mg total) by mouth daily as needed for moderate constipation. 12/09/18   Rodolph Bong, MD  cefdinir (OMNICEF) 300 MG capsule Take 1 capsule (300 mg total) by mouth 2 (two) times daily. 10/14/20   Tilden Fossa, MD  D-MANNOSE PO Take 1 tablet by mouth daily.    [provider]  esomeprazole (NEXIUM) 20 MG capsule Take 20 mg by mouth every 3 (three) days.     [provider]  ibuprofen (ADVIL,MOTRIN) 200 MG tablet Take 600 mg  by mouth daily as needed for headache or moderate pain.     [provider]  senna (SENOKOT) 8.6 MG TABS tablet Take 1 tablet by mouth daily as needed for mild constipation.    [provider]    Allergies    Demerol [meperidine]  Review of Systems   Review of Systems  All other systems reviewed and are negative.   Physical Exam Updated Vital Signs BP 138/84   Pulse 97   Temp 97.9 F (36.6 C) (Oral)   Resp 15   SpO2 97%   Physical Exam Vitals and nursing note reviewed.  Constitutional:      General: He is not in acute distress.    Appearance: He is well-developed and well-nourished. He is obese. He is not ill-appearing, toxic-appearing or diaphoretic.  HENT:     Head: Normocephalic and atraumatic.     Right Ear: External ear normal.     Left Ear: External ear normal.  Eyes:     Extraocular Movements: EOM normal.     Conjunctiva/sclera: Conjunctivae normal.     Pupils: Pupils are equal, round, and reactive to light.  Neck:     Trachea: Phonation normal.  Cardiovascular:     Rate and Rhythm: Normal rate and regular rhythm.     Heart sounds: Normal heart sounds.  Pulmonary:     Effort: Pulmonary effort  is normal.     Breath sounds: Normal breath sounds.  Chest:     Chest wall: No bony tenderness.  Abdominal:     General: There is no distension.     Palpations: Abdomen is soft.     Tenderness: There is no abdominal tenderness.  Musculoskeletal:        General: Normal range of motion.     Cervical back: Normal range of motion and neck supple.  Skin:    General: Skin is warm, dry and intact.  Neurological:     Mental Status: He is alert and oriented to person, place, and time.     Cranial Nerves: No cranial nerve deficit.     Sensory: No sensory deficit.     Motor: No abnormal muscle tone.     Coordination: Coordination normal.  Psychiatric:        Mood and Affect: Mood and affect and mood normal.        Behavior: Behavior normal.        Thought Content: Thought content normal.        Judgment: Judgment normal.     ED Results / Procedures / Treatments   Labs (all labs ordered are listed, but only abnormal results are displayed) Labs Reviewed  CBC - Abnormal; Notable for the following components:      Result Value   RBC 5.97 (*)    Hemoglobin 17.2 (*)    All other components within normal limits  LIPASE, BLOOD  COMPREHENSIVE METABOLIC PANEL  URINALYSIS, ROUTINE W REFLEX MICROSCOPIC    EKG None  Radiology No results found.  Procedures Procedures (including critical care time)  Medications Ordered in ED Medications - No data to display  ED Course  I have reviewed the triage vital signs and the nursing notes.  Pertinent labs & imaging results that were available during my care of the patient were reviewed by me and considered in my medical decision making (see chart for details).    MDM Rules/Calculators/A&P  Patient Vitals for the past 24 hrs:  BP Temp Temp src Pulse Resp SpO2 Height Weight  11/09/20 1300 122/76 -- -- 80 13 98 % -- --  11/09/20 1245 (!) 127/92 -- -- 82 16 98 % -- --  11/09/20 1230 (!) 135/91 98.8 F (37.1 C)  Oral 80 12 99 % 6' (1.829 m) 117.9 kg  11/09/20 0929 138/84 97.9 F (36.6 C) Oral 97 15 97 % -- --    At discharge- reevaluation with update and discussion. After initial assessment and treatment, an updated evaluation reveals stable status, findings discussed and questions answered. Mancel Bale   Medical Decision Making:  This patient is presenting for evaluation of cloudy urine, which does require a range of treatment options, and is a complaint that involves a moderate risk of morbidity and mortality. The differential diagnoses include acute infection, metabolic disorder, nonspecific kidney disorder. I decided to review old records, and in summary 69 year old male, with ileal conduit, chronic urinary drainage bag, presenting with cloudy urine.  I did not require additional historical information from anyone.  Clinical Laboratory Tests Ordered, included CBC, Metabolic panel and Urine culture. Review indicates normal. Radiologic Tests Ordered, included normal except abnormal urinalysis possibly consistent with urinary tract infection.  Urine culture ordered..    Critical Interventions-evaluation, laboratory testing, observation and reassessment  After These Interventions, the Patient was reevaluated and was found stable for discharge.  Possible UTI, treatment initiated with Cipro.  Culture ordered.  CRITICAL CARE-no Performed by: Mancel Bale  Nursing Notes Reviewed/ Care Coordinated Applicable Imaging Reviewed Interpretation of Laboratory Data incorporated into ED treatment  The patient appears reasonably screened and/or stabilized for discharge and I doubt any other medical condition or other [facility] requiring further screening, evaluation, or treatment in the ED at this time prior to discharge.  Plan: Home Medications-continue usual medication; Home Treatments-regular diet and activity; return here if the recommended treatment, does not improve the symptoms; Recommended follow  up-neurology follow-up 1 week and as needed     Final Clinical Impression(s) / ED Diagnoses Final diagnoses:  Urinary tract infection without hematuria, site unspecified    Rx / DC Orders ED Discharge Orders    None       Mancel Bale, MD 11/11/20 1739

## 2020-11-11 LAB — URINE CULTURE

## 2020-12-16 DIAGNOSIS — N3000 Acute cystitis without hematuria: Secondary | ICD-10-CM | POA: Diagnosis not present

## 2021-01-14 DIAGNOSIS — R030 Elevated blood-pressure reading, without diagnosis of hypertension: Secondary | ICD-10-CM | POA: Diagnosis not present

## 2021-01-14 DIAGNOSIS — Z20822 Contact with and (suspected) exposure to covid-19: Secondary | ICD-10-CM | POA: Diagnosis not present

## 2021-01-14 DIAGNOSIS — J029 Acute pharyngitis, unspecified: Secondary | ICD-10-CM | POA: Diagnosis not present

## 2021-01-14 DIAGNOSIS — J209 Acute bronchitis, unspecified: Secondary | ICD-10-CM | POA: Diagnosis not present

## 2021-01-14 DIAGNOSIS — J019 Acute sinusitis, unspecified: Secondary | ICD-10-CM | POA: Diagnosis not present

## 2021-05-09 ENCOUNTER — Ambulatory Visit: Payer: Self-pay | Admitting: General Surgery

## 2021-05-09 DIAGNOSIS — Z86010 Personal history of colonic polyps: Secondary | ICD-10-CM | POA: Diagnosis not present

## 2021-05-09 DIAGNOSIS — Z1211 Encounter for screening for malignant neoplasm of colon: Secondary | ICD-10-CM | POA: Diagnosis not present

## 2021-05-09 NOTE — H&P (Signed)
The patient is a 70 year old male presenting for a post-operative visit. 70 year old male who presents to the office for follow-up after open cystectomy and anterior rectal repair in Sept 18.   He then underwent closure of a diverting loop colostomy several months later.  He is doing well and has minimal pain.  He has intermittent issues with his bowel function.  He gets constipated easily.  He uses MiraLAX and stool softeners as well as a hernia belt to help with these symptoms.  He denies any other major medical problems at this time.   Problem List/Past Medical Kristopher Ruff, MD; 03/29/6159 11:14 AM) CANCER, BLADDER, NECK (C67.5)  HISTORY OF RECTAL SURGERY (Z98.890)  You are due for a repeat colonoscopy in 2022. ENCOUNTER FOR COLONOSCOPY DUE TO HISTORY OF COLONIC POLYP (Z12.11)   Past Surgical History Kristopher Ruff, MD; 7/37/1062 11:14 AM) Gallbladder Surgery - Open  Prostate Surgery - Removal   Diagnostic Studies History Kristopher Ruff, MD; 6/94/8546 11:14 AM) Colonoscopy  1-5 years ago  Allergies Mammie Lorenzo, LPN; 2/70/3500 93:81 AM) No Known Drug Allergies  [07/08/2017]: Allergies Reconciled   Medication History Mammie Lorenzo, LPN; 07/10/9370 69:67 AM) No Current Medications  Medications Reconciled   Social History Kristopher Ruff, MD; 8/93/8101 11:14 AM) Alcohol use  Occasional alcohol use. Caffeine use  Carbonated beverages, Coffee, Tea. Illicit drug use  Remotely quit drug use. Tobacco use  Former smoker.  Family History Kristopher Ruff, MD; 7/51/0258 11:14 AM) Arthritis  Mother. Cancer  Father, Sister. Colon Polyps  Mother. Prostate Cancer  Family Members In General. Rectal Cancer  Family Members In General. Respiratory Condition  Mother.  Other Problems Kristopher Ruff, MD; 04/08/7823 11:14 AM) Back Pain  Bladder Problems  Cancer  Gastroesophageal Reflux Disease  Melanoma  Prostate Cancer  Sleep Apnea      Review of Systems Kristopher Ruff MD; 2/35/3614  11:14 AM) General Not Present- Appetite Loss, Chills, Fatigue, Fever, Night Sweats, Weight Gain and Weight Loss. Skin Not Present- Change in Wart/Mole, Dryness, Hives, Jaundice, New Lesions, Non-Healing Wounds, Rash and Ulcer. HEENT Present- Hearing Loss, Hoarseness, Nose Bleed, Ringing in the Ears, Seasonal Allergies and Wears glasses/contact lenses. Not Present- Earache, Oral Ulcers, Sinus Pain, Sore Throat, Visual Disturbances and Yellow Eyes. Respiratory Not Present- Bloody sputum, Chronic Cough, Difficulty Breathing, Snoring and Wheezing. Breast Not Present- Breast Mass, Breast Pain, Nipple Discharge and Skin Changes. Cardiovascular Present- Palpitations. Not Present- Chest Pain, Difficulty Breathing Lying Down, Leg Cramps, Rapid Heart Rate, Shortness of Breath and Swelling of Extremities. Gastrointestinal Not Present- Abdominal Pain, Bloating, Bloody Stool, Change in Bowel Habits, Chronic diarrhea, Constipation, Difficulty Swallowing, Excessive gas, Gets full quickly at meals, Hemorrhoids, Indigestion, Nausea, Rectal Pain and Vomiting. Male Genitourinary Present- Blood in Urine and Impotence. Not Present- Change in Urinary Stream, Frequency, Nocturia, Painful Urination, Urgency and Urine Leakage. Musculoskeletal Present- Back Pain and Joint Pain. Not Present- Joint Stiffness, Muscle Pain, Muscle Weakness and Swelling of Extremities. Neurological Present- Numbness. Not Present- Decreased Memory, Fainting, Headaches, Seizures, Tingling, Tremor, Trouble walking and Weakness. Psychiatric Not Present- Anxiety, Bipolar, Change in Sleep Pattern, Depression, Fearful and Frequent crying. Endocrine Not Present- Cold Intolerance, Excessive Hunger, Hair Changes, Heat Intolerance and New Diabetes. Hematology Not Present- Blood Thinners, Easy Bruising, Excessive bleeding, Gland problems, HIV and Persistent Infections.  Vitals Claiborne Billings Dockery LPN; 4/31/5400 86:76 AM) 05/09/2021 10:57 AM Weight: 267.2 lb    Height: 70 in  Body Surface Area: 2.36 m   Body Mass Index: 38.34 kg/m   Pulse:  90 (Regular)    BP: 124/72(Sitting, Left Arm, Standard)        Physical Exam Kristopher Ruff MD; 6/41/5830 11:15 AM)  General Mental Status - Alert. General Appearance - Consistent with stated age. Hydration - Well hydrated. Voice - Normal.  Chest and Lung Exam Chest and lung exam reveals  - quiet, even and easy respiratory effort with no use of accessory muscles. Inspection Chest Wall - Normal. Back - normal.  Abdomen Inspection Skin - Scar - Note: Incision: clean, dry, ss drainage. Palpation/Percussion Palpation and Percussion of the abdomen reveal - Soft, Non Tender, No Rebound tenderness and No Rigidity (guarding).    Assessment & Plan Kristopher Ruff MD; 9/40/7680 11:13 AM)  Kristopher Thompson FOR COLONOSCOPY DUE TO HISTORY OF COLONIC POLYP (Z12.11) Impression: 70 year old male status post diverting loop colostomy for rectal perforation in 2019. He underwent a flexible sigmoidoscopy with polyp resection after this surgery. He then underwent a colostomy reversal. He has a urostomy due to bladder resection on the right lower quadrant. 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 He is having some trouble with irregular bowel habits, which she controls with medications and a hernia belt. He is due for screening colonoscopy. We have discussed this in detail including risk of bleeding, perforation and missed pathology. I believe he understands this and wishes to proceed.

## 2021-05-31 DIAGNOSIS — R8271 Bacteriuria: Secondary | ICD-10-CM | POA: Diagnosis not present

## 2021-05-31 DIAGNOSIS — N3000 Acute cystitis without hematuria: Secondary | ICD-10-CM | POA: Diagnosis not present

## 2021-06-21 DIAGNOSIS — Z6836 Body mass index (BMI) 36.0-36.9, adult: Secondary | ICD-10-CM | POA: Diagnosis not present

## 2021-06-21 DIAGNOSIS — E7800 Pure hypercholesterolemia, unspecified: Secondary | ICD-10-CM | POA: Diagnosis not present

## 2021-06-21 DIAGNOSIS — Z7289 Other problems related to lifestyle: Secondary | ICD-10-CM | POA: Diagnosis not present

## 2021-06-21 DIAGNOSIS — H9113 Presbycusis, bilateral: Secondary | ICD-10-CM | POA: Diagnosis not present

## 2021-06-21 DIAGNOSIS — R002 Palpitations: Secondary | ICD-10-CM | POA: Diagnosis not present

## 2021-06-21 DIAGNOSIS — E039 Hypothyroidism, unspecified: Secondary | ICD-10-CM | POA: Diagnosis not present

## 2021-06-21 DIAGNOSIS — Z0000 Encounter for general adult medical examination without abnormal findings: Secondary | ICD-10-CM | POA: Diagnosis not present

## 2021-06-21 DIAGNOSIS — R42 Dizziness and giddiness: Secondary | ICD-10-CM | POA: Diagnosis not present

## 2021-06-21 DIAGNOSIS — R5383 Other fatigue: Secondary | ICD-10-CM | POA: Diagnosis not present

## 2021-06-21 DIAGNOSIS — Z1389 Encounter for screening for other disorder: Secondary | ICD-10-CM | POA: Diagnosis not present

## 2021-06-21 DIAGNOSIS — Z125 Encounter for screening for malignant neoplasm of prostate: Secondary | ICD-10-CM | POA: Diagnosis not present

## 2021-06-21 DIAGNOSIS — I70219 Atherosclerosis of native arteries of extremities with intermittent claudication, unspecified extremity: Secondary | ICD-10-CM | POA: Diagnosis not present

## 2021-06-21 DIAGNOSIS — E785 Hyperlipidemia, unspecified: Secondary | ICD-10-CM | POA: Diagnosis not present

## 2021-06-21 DIAGNOSIS — Z131 Encounter for screening for diabetes mellitus: Secondary | ICD-10-CM | POA: Diagnosis not present

## 2021-06-21 DIAGNOSIS — D485 Neoplasm of uncertain behavior of skin: Secondary | ICD-10-CM | POA: Diagnosis not present

## 2021-06-21 DIAGNOSIS — Z79899 Other long term (current) drug therapy: Secondary | ICD-10-CM | POA: Diagnosis not present

## 2021-06-21 DIAGNOSIS — Z1331 Encounter for screening for depression: Secondary | ICD-10-CM | POA: Diagnosis not present

## 2021-06-28 DIAGNOSIS — H903 Sensorineural hearing loss, bilateral: Secondary | ICD-10-CM | POA: Diagnosis not present

## 2021-06-29 ENCOUNTER — Other Ambulatory Visit: Payer: Self-pay

## 2021-06-29 ENCOUNTER — Ambulatory Visit (HOSPITAL_COMMUNITY)
Admission: RE | Admit: 2021-06-29 | Discharge: 2021-06-29 | Disposition: A | Discharge status: Home / Self Care | Payer: Medicare Other | Attending: General Surgery | Admitting: General Surgery

## 2021-06-29 ENCOUNTER — Ambulatory Visit (HOSPITAL_COMMUNITY): Payer: Medicare Other | Admitting: Anesthesiology

## 2021-06-29 ENCOUNTER — Encounter (HOSPITAL_COMMUNITY)
Admission: RE | Disposition: A | Discharge status: Home / Self Care | Payer: Self-pay | Source: Home / Self Care | Attending: General Surgery

## 2021-06-29 ENCOUNTER — Encounter (HOSPITAL_COMMUNITY): Payer: Self-pay | Admitting: General Surgery

## 2021-06-29 DIAGNOSIS — Z1211 Encounter for screening for malignant neoplasm of colon: Secondary | ICD-10-CM | POA: Diagnosis not present

## 2021-06-29 DIAGNOSIS — K635 Polyp of colon: Secondary | ICD-10-CM | POA: Diagnosis not present

## 2021-06-29 DIAGNOSIS — Z936 Other artificial openings of urinary tract status: Secondary | ICD-10-CM | POA: Insufficient documentation

## 2021-06-29 DIAGNOSIS — K435 Parastomal hernia without obstruction or  gangrene: Secondary | ICD-10-CM | POA: Insufficient documentation

## 2021-06-29 DIAGNOSIS — Z8551 Personal history of malignant neoplasm of bladder: Secondary | ICD-10-CM | POA: Insufficient documentation

## 2021-06-29 DIAGNOSIS — K621 Rectal polyp: Secondary | ICD-10-CM | POA: Diagnosis not present

## 2021-06-29 DIAGNOSIS — K5730 Diverticulosis of large intestine without perforation or abscess without bleeding: Secondary | ICD-10-CM | POA: Insufficient documentation

## 2021-06-29 DIAGNOSIS — Z86010 Personal history of colonic polyps: Secondary | ICD-10-CM | POA: Diagnosis not present

## 2021-06-29 DIAGNOSIS — Z87891 Personal history of nicotine dependence: Secondary | ICD-10-CM | POA: Diagnosis not present

## 2021-06-29 HISTORY — PX: COLONOSCOPY: SHX5424

## 2021-06-29 HISTORY — PX: POLYPECTOMY: SHX5525

## 2021-06-29 SURGERY — COLONOSCOPY
Anesthesia: Monitor Anesthesia Care

## 2021-06-29 MED ORDER — PROPOFOL 500 MG/50ML IV EMUL
INTRAVENOUS | Status: DC | PRN
  Administered 2021-06-29: 125 ug/kg/min via INTRAVENOUS

## 2021-06-29 MED ORDER — LACTATED RINGERS IV SOLN
INTRAVENOUS | Status: DC
  Administered 2021-06-29: 1000 mL via INTRAVENOUS

## 2021-06-29 MED ORDER — SODIUM CHLORIDE 0.9 % IV SOLN: INTRAVENOUS | Status: DC

## 2021-06-29 MED ORDER — SODIUM CHLORIDE 0.9% FLUSH: 3.0000 mL | Freq: Two times a day (BID) | INTRAVENOUS | Status: DC

## 2021-06-29 NOTE — Op Note (Signed)
[facility] Patient Name: Kristopher Thompson Procedure Date: 06/29/2021 MRN: [id number] Attending MD: Leighton Ruff , MD Date of Birth: 07/20/1951 CSN: [id number] Age: 70 Admit Type: Outpatient Procedure:                Colonoscopy Indications:              High risk colon cancer surveillance: Personal                            history of colonic polyps Providers:                Leighton Ruff, MD, Particia Nearing, RN, Elspeth Cho                            Tech., Technician, Herbie Drape, CRNA Referring MD:              Medicines:                Propofol per Anesthesia Complications:            No immediate complications. Estimated blood loss:                            Minimal. Estimated Blood Loss:      Procedure:                Pre-Anesthesia Assessment:                           - Prior to the procedure, a History and Physical                            was performed, and patient medications and                            allergies were reviewed. The patient's tolerance of                            previous anesthesia was also reviewed. The risks                            and benefits of the procedure and the sedation                            options and risks were discussed with the patient.                            All questions were answered, and informed consent                            was obtained. Prior Anticoagulants: The patient has                            taken no previous anticoagulant or antiplatelet                            agents. ASA Grade Assessment: III -  A patient with                            severe systemic disease. After reviewing the risks                            and benefits, the patient was deemed in                            satisfactory condition to undergo the procedure.                           After obtaining informed consent, the colonoscope                            was passed under direct vision. Throughout the                             procedure, the patient's blood pressure, pulse, and                            oxygen saturations were monitored continuously. The                            [id number]) Olympus colonoscope was                            introduced through the anus and advanced to the the                            cecum, identified by the ileocecal valve. The                            colonoscopy was performed with moderate difficulty                            due to a tortuous colon. Successful completion of                            the procedure was aided by using manual pressure.                            The patient tolerated the procedure well. The                            quality of the bowel preparation was good. The                            ileocecal valve was photographed. Scope withdrawal                            time was 22 minutes. Scope In: 1:48:20 PM Scope Out: 2:22:46 PM Scope Withdrawal Time: 0 hours 21 minutes 21 seconds  Total Procedure Duration: 0 hours 34  minutes 26 seconds  Findings:      The perianal and digital rectal examinations were normal. Pertinent       negatives include normal sphincter tone.      Multiple medium-mouthed diverticula were found in the sigmoid colon.      A 3 mm polyp was found in the descending colon. The polyp was sessile.       The polyp was removed with a cold biopsy forceps. Resection and       retrieval were complete. Estimated blood loss was minimal.      A 2 mm polyp was found in the proximal rectum. The polyp was sessile.       The polyp was removed with a cold biopsy forceps. Resection and       retrieval were complete. Estimated blood loss was minimal. Impression:               - Diverticulosis in the sigmoid colon.                           - One 3 mm polyp in the descending colon, removed                            with a cold biopsy forceps. Resected and retrieved.                           - One 2  mm polyp in the proximal rectum, removed                            with a cold biopsy forceps. Resected and retrieved. Moderate Sedation:      Moderate (conscious) sedation was personally administered by an       anesthesia professional. The following parameters were monitored: oxygen       saturation, heart rate, blood pressure, and response to care. Recommendation:           - Discharge patient to home (ambulatory).                           - Resume previous diet.                           - Continue present medications.                           - Await pathology results.                           - Repeat colonoscopy for surveillance based on                            pathology results. Procedure Code(s):        --- Professional ---                           [phone], Colonoscopy, flexible; with biopsy, single                            or multiple Diagnosis Code(s):        ---  Professional ---                           Z86.010, Personal history of colonic polyps                           K63.5, Polyp of colon                           K62.1, Rectal polyp                           K57.30, Diverticulosis of large intestine without                            perforation or abscess without bleeding CPT copyright 2019 [employer]. All rights reserved. The codes documented in this report are preliminary and upon coder review may  be revised to meet current compliance requirements. Leighton Ruff, MD Leighton Ruff, MD 0000000 2:32:46 PM This report has been signed electronically. Number of Addenda: 0

## 2021-06-29 NOTE — Transfer of Care (Signed)
Immediate Anesthesia Transfer of Care Note  Patient: Kristopher Thompson  Procedure(s) Performed: COLONOSCOPY POLYPECTOMY  Patient Location: PACU  Anesthesia Type:MAC  Level of Consciousness: sedated, patient cooperative and responds to stimulation  Airway & Oxygen Therapy: Patient Spontanous Breathing and Patient connected to face mask oxygen  Post-op Assessment: Report given to RN and Post -op Vital signs reviewed and stable  Post vital signs: Reviewed and stable  Last Vitals:  Vitals Value Taken Time  BP 135/77 06/29/21 1430  Temp    Pulse 101 06/29/21 1430  Resp 22 06/29/21 1430  SpO2 100 % 06/29/21 1430  Vitals shown include unvalidated device data.  Last Pain:  Vitals:   06/29/21 1429  TempSrc:   PainSc: Asleep         Complications: No notable events documented.

## 2021-06-29 NOTE — Anesthesia Preprocedure Evaluation (Signed)
Anesthesia Evaluation  Patient identified by MRN, date of birth, ID band Patient awake    Reviewed: Allergy & Precautions, NPO status , Patient's Chart, lab work & pertinent test results  Airway Mallampati: II  TM Distance: >3 FB Neck ROM: Full    Dental no notable dental hx.    Pulmonary neg pulmonary ROS, former smoker,    Pulmonary exam normal breath sounds clear to auscultation       Cardiovascular negative cardio ROS Normal cardiovascular exam Rhythm:Regular Rate:Normal     Neuro/Psych negative neurological ROS  negative psych ROS   GI/Hepatic Neg liver ROS, GERD  ,  Endo/Other  negative endocrine ROS  Renal/GU negative Renal ROS  negative genitourinary   Musculoskeletal negative musculoskeletal ROS (+)   Abdominal   Peds negative pediatric ROS (+)  Hematology negative hematology ROS (+)   Anesthesia Other Findings   Reproductive/Obstetrics negative OB ROS                             Anesthesia Physical Anesthesia Plan  ASA: 2  Anesthesia Plan: MAC   Post-op Pain Management:    Induction: Intravenous  PONV Risk Score and Plan: 1 and Propofol infusion and Treatment may vary due to age or medical condition  Airway Management Planned: Simple Face Mask  Additional Equipment:   Intra-op Plan:   Post-operative Plan:   Informed Consent: I have reviewed the patients History and Physical, chart, labs and discussed the procedure including the risks, benefits and alternatives for the proposed anesthesia with the patient or authorized representative who has indicated his/her understanding and acceptance.     Dental advisory given  Plan Discussed with: CRNA and Surgeon  Anesthesia Plan Comments:         Anesthesia Quick Evaluation

## 2021-06-29 NOTE — Discharge Instructions (Signed)
YOU HAD AN ENDOSCOPIC PROCEDURE TODAY: Refer to the procedure report and other information in the discharge instructions given to you for any specific questions about what was found during the examination. If this information does not answer your questions, please call [place] at [phone] to clarify.   YOU SHOULD EXPECT: Some feelings of bloating in the abdomen. Passage of more gas than usual. Walking can help get rid of the air that was put into your GI tract during the procedure and reduce the bloating. If you had a lower endoscopy (such as a colonoscopy or flexible sigmoidoscopy) you may notice spotting of blood in your stool or on the toilet paper. Some abdominal soreness may be present for a day or two, also.  DIET: Your first meal following the procedure should be a light meal and then it is ok to progress to your normal diet. A half-sandwich or bowl of soup is an example of a good first meal. Heavy or fried foods are harder to digest and may make you feel nauseous or bloated. Drink plenty of fluids but you should avoid alcoholic beverages for 24 hours. If you had an esophageal dilation, please see attached information for diet.   ACTIVITY: Your care partner should take you home directly after the procedure. You should plan to take it easy, moving slowly for the rest of the day. You can resume normal activity the day after the procedure however YOU SHOULD NOT DRIVE, use power tools, machinery or perform tasks that involve climbing or major physical exertion for 24 hours (because of the sedation medicines used during the test).   SYMPTOMS TO REPORT IMMEDIATELY: A gastroenterologist can be reached at any hour. Please call [phone] for any of the following symptoms:  Following lower endoscopy (colonoscopy, flexible sigmoidoscopy) Excessive amounts of blood in the stool  Significant tenderness, worsening of abdominal pains  Swelling of the abdomen that is new, acute  Fever of 100  or higher   FOLLOW UP:  If any biopsies were taken you will be contacted by phone or by letter within the next 1-3 weeks. Call [phone] if you have not heard about the biopsies in 3 weeks.  Please also call with any specific questions about appointments or follow up tests.

## 2021-06-29 NOTE — H&P (Signed)
70 year old male who presents to the office for follow-up after open cystectomy and anterior rectal repair in Sept 2018.   He then underwent closure of a diverting loop colostomy several months later.  He is doing well and has minimal pain.  He has intermittent issues with his bowel function.  He gets constipated easily.  He uses MiraLAX and stool softeners as well as a hernia belt to help with these symptoms.  He denies any other major medical problems at this time.     Problem List/Past Medical Leighton Ruff, MD; [id number] 11:14 AM) CANCER, BLADDER, NECK (C67.5)  HISTORY OF RECTAL SURGERY (Z98.890)  ENCOUNTER FOR COLONOSCOPY DUE TO HISTORY OF COLONIC POLYP (Z12.11)    Past Surgical History Leighton Ruff, MD; [id number] 11:14 AM) Gallbladder Surgery - Open  Prostate Surgery - Removal    Diagnostic Studies History Leighton Ruff, MD; [id number] 11:14 AM) Colonoscopy  1-5 years ago   Allergies Mammie Lorenzo, LPN; [id number] [id number] AM) No Known Drug Allergies  [07/08/2017]: Allergies Reconciled    Medication History Mammie Lorenzo, LPN; [id number] [id number] AM) No Current Medications  Medications Reconciled    Social History Leighton Ruff, MD; [id number] 11:14 AM) Alcohol use  Occasional alcohol use. Caffeine use  Carbonated beverages, Coffee, Tea. Illicit drug use  Remotely quit drug use. Tobacco use  Former smoker.   Family History Leighton Ruff, MD; [id number] 11:14 AM) Arthritis  Mother. Cancer  Father, Sister. Colon Polyps  Mother. Prostate Cancer  Family Members In General. Rectal Cancer  Family Members In General. Respiratory Condition  Mother.   Other Problems Leighton Ruff, MD; [id number] 11:14 AM) Back Pain  Bladder Problems  Cancer  Gastroesophageal Reflux Disease  Melanoma  Prostate Cancer  Sleep Apnea          Review of Systems  General Not Present- Appetite Loss, Chills, Fatigue, Fever, Night Sweats, Weight Gain and Weight Loss. Skin Not Present- Change in  Wart/Mole, Dryness, Hives, Jaundice, New Lesions, Non-Healing Wounds, Rash and Ulcer. HEENT Present- Hearing Loss, Hoarseness, Nose Bleed, Ringing in the Ears, Seasonal Allergies and Wears glasses/contact lenses. Not Present- Earache, Oral Ulcers, Sinus Pain, Sore Throat, Visual Disturbances and Yellow Eyes. Respiratory Not Present- Bloody sputum, Chronic Cough, Difficulty Breathing, Snoring and Wheezing. Breast Not Present- Breast Mass, Breast Pain, Nipple Discharge and Skin Changes. Cardiovascular Present- Palpitations. Not Present- Chest Pain, Difficulty Breathing Lying Down, Leg Cramps, Rapid Heart Rate, Shortness of Breath and Swelling of Extremities. Gastrointestinal Not Present- Abdominal Pain, Bloating, Bloody Stool, Change in Bowel Habits, Chronic diarrhea, Constipation, Difficulty Swallowing, Excessive gas, Gets full quickly at meals, Hemorrhoids, Indigestion, Nausea, Rectal Pain and Vomiting. Male Genitourinary Present- Blood in Urine and Impotence. Not Present- Change in Urinary Stream, Frequency, Nocturia, Painful Urination, Urgency and Urine Leakage. Musculoskeletal Present- Back Pain and Joint Pain. Not Present- Joint Stiffness, Muscle Pain, Muscle Weakness and Swelling of Extremities. Neurological Present- Numbness. Not Present- Decreased Memory, Fainting, Headaches, Seizures, Tingling, Tremor, Trouble walking and Weakness. Psychiatric Not Present- Anxiety, Bipolar, Change in Sleep Pattern, Depression, Fearful and Frequent crying. Endocrine Not Present- Cold Intolerance, Excessive Hunger, Hair Changes, Heat Intolerance and New Diabetes. Hematology Not Present- Blood Thinners, Easy Bruising, Excessive bleeding, Gland problems, HIV and Persistent Infections.   BP 138/63   Pulse 70   Temp 98 F (36.7 C) (Oral)   Resp 12   Ht '5\' 11"'$  (1.803 m)   Wt 115.7 kg   SpO2 100%   BMI 35.57 kg/m     Physical Exam  General Mental Status - Alert. General Appearance - Consistent with  stated age. Hydration - Well hydrated. Voice - Normal.   Chest and Lung Exam Chest and lung exam reveals  - quiet, even and easy respiratory effort with no use of accessory muscles. Inspection Chest Wall - Normal. Back - normal.   Abdomen Inspection Skin - Scar - Note: Incision: clean, dry, ss drainage. Palpation/Percussion Palpation and Percussion of the abdomen reveal - Soft, Non Tender, No Rebound tenderness and No Rigidity (guarding).  hernia: LLQ, parastomal hernia, both soft but incarcerated     Assessment & Plan   ENCOUNTER FOR COLONOSCOPY DUE TO HISTORY OF COLONIC POLYP (Z12.11) Impression: 70 year old male status post diverting loop colostomy for rectal perforation in 2019. He underwent a flexible sigmoidoscopy with polyp resection after this surgery. He then underwent a colostomy reversal. He has a urostomy due to bladder resection on the right lower quadrant. 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 He is having some trouble with irregular bowel habits, which he controls with medications and a hernia belt. He is due for screening colonoscopy. We have discussed this in detail including risk of bleeding, perforation and missed pathology. I believe he understands this and wishes to proceed.

## 2021-06-30 ENCOUNTER — Encounter (HOSPITAL_COMMUNITY): Payer: Self-pay | Admitting: General Surgery

## 2021-06-30 LAB — SURGICAL PATHOLOGY

## 2021-06-30 NOTE — Anesthesia Postprocedure Evaluation (Signed)
Anesthesia Post Note  Patient: Kristopher Thompson  Procedure(s) Performed: COLONOSCOPY POLYPECTOMY     Patient location during evaluation: PACU Anesthesia Type: MAC Level of consciousness: awake and alert Pain management: pain level controlled Vital Signs Assessment: post-procedure vital signs reviewed and stable Respiratory status: spontaneous breathing, nonlabored ventilation, respiratory function stable and patient connected to nasal cannula oxygen Cardiovascular status: stable and blood pressure returned to baseline Postop Assessment: no apparent nausea or vomiting Anesthetic complications: no   No notable events documented.  Last Vitals:  Vitals:   06/29/21 1450 06/29/21 1500  BP: (!) 141/84 (!) 158/90  Pulse: 77 81  Resp: 13 14  Temp:    SpO2: 98% 98%    Last Pain:  Vitals:   06/29/21 1500  TempSrc:   PainSc: 0-No pain                 [name]

## 2021-07-05 ENCOUNTER — Encounter: Payer: Self-pay | Admitting: General Surgery

## 2021-07-24 DIAGNOSIS — E669 Obesity, unspecified: Secondary | ICD-10-CM | POA: Diagnosis not present

## 2021-07-24 DIAGNOSIS — H9190 Unspecified hearing loss, unspecified ear: Secondary | ICD-10-CM

## 2021-07-24 DIAGNOSIS — K435 Parastomal hernia without obstruction or  gangrene: Secondary | ICD-10-CM | POA: Diagnosis not present

## 2021-07-24 DIAGNOSIS — R1084 Generalized abdominal pain: Secondary | ICD-10-CM | POA: Diagnosis not present

## 2021-07-24 DIAGNOSIS — Z936 Other artificial openings of urinary tract status: Secondary | ICD-10-CM | POA: Diagnosis not present

## 2021-07-24 DIAGNOSIS — Z8551 Personal history of malignant neoplasm of bladder: Secondary | ICD-10-CM | POA: Diagnosis not present

## 2021-07-24 DIAGNOSIS — K432 Incisional hernia without obstruction or gangrene: Secondary | ICD-10-CM | POA: Diagnosis not present

## 2021-09-05 DIAGNOSIS — H6121 Impacted cerumen, right ear: Secondary | ICD-10-CM | POA: Diagnosis not present

## 2021-09-05 DIAGNOSIS — H903 Sensorineural hearing loss, bilateral: Secondary | ICD-10-CM | POA: Diagnosis not present

## 2021-09-06 DIAGNOSIS — Z23 Encounter for immunization: Secondary | ICD-10-CM | POA: Diagnosis not present

## 2021-09-13 DIAGNOSIS — E785 Hyperlipidemia, unspecified: Secondary | ICD-10-CM | POA: Diagnosis not present

## 2021-09-22 DIAGNOSIS — R1084 Generalized abdominal pain: Secondary | ICD-10-CM | POA: Diagnosis not present

## 2021-09-22 DIAGNOSIS — Z8551 Personal history of malignant neoplasm of bladder: Secondary | ICD-10-CM | POA: Diagnosis not present

## 2021-09-22 DIAGNOSIS — K5730 Diverticulosis of large intestine without perforation or abscess without bleeding: Secondary | ICD-10-CM | POA: Diagnosis not present

## 2021-09-22 DIAGNOSIS — N1330 Unspecified hydronephrosis: Secondary | ICD-10-CM | POA: Diagnosis not present

## 2021-09-22 DIAGNOSIS — I2510 Atherosclerotic heart disease of native coronary artery without angina pectoris: Secondary | ICD-10-CM | POA: Diagnosis not present

## 2021-09-22 DIAGNOSIS — Z8546 Personal history of malignant neoplasm of prostate: Secondary | ICD-10-CM | POA: Diagnosis not present

## 2021-09-22 DIAGNOSIS — K5939 Other megacolon: Secondary | ICD-10-CM | POA: Diagnosis not present

## 2021-09-22 DIAGNOSIS — K449 Diaphragmatic hernia without obstruction or gangrene: Secondary | ICD-10-CM | POA: Diagnosis not present

## 2021-09-22 DIAGNOSIS — D519 Vitamin B12 deficiency anemia, unspecified: Secondary | ICD-10-CM | POA: Diagnosis not present

## 2021-09-29 DIAGNOSIS — D381 Neoplasm of uncertain behavior of trachea, bronchus and lung: Secondary | ICD-10-CM | POA: Diagnosis not present

## 2021-09-29 DIAGNOSIS — Z8546 Personal history of malignant neoplasm of prostate: Secondary | ICD-10-CM | POA: Diagnosis not present

## 2021-09-29 DIAGNOSIS — Z8551 Personal history of malignant neoplasm of bladder: Secondary | ICD-10-CM | POA: Diagnosis not present

## 2021-09-29 DIAGNOSIS — R8271 Bacteriuria: Secondary | ICD-10-CM | POA: Diagnosis not present

## 2021-10-17 ENCOUNTER — Ambulatory Visit (INDEPENDENT_AMBULATORY_CARE_PROVIDER_SITE_OTHER): Payer: Medicare Other | Admitting: Dermatology

## 2021-10-17 ENCOUNTER — Other Ambulatory Visit: Payer: Self-pay

## 2021-10-17 DIAGNOSIS — Z85828 Personal history of other malignant neoplasm of skin: Secondary | ICD-10-CM | POA: Diagnosis not present

## 2021-10-17 DIAGNOSIS — B354 Tinea corporis: Secondary | ICD-10-CM

## 2021-10-17 DIAGNOSIS — Z1283 Encounter for screening for malignant neoplasm of skin: Secondary | ICD-10-CM | POA: Diagnosis not present

## 2021-10-17 DIAGNOSIS — L918 Other hypertrophic disorders of the skin: Secondary | ICD-10-CM | POA: Diagnosis not present

## 2021-10-17 DIAGNOSIS — D1801 Hemangioma of skin and subcutaneous tissue: Secondary | ICD-10-CM

## 2021-10-17 DIAGNOSIS — L821 Other seborrheic keratosis: Secondary | ICD-10-CM

## 2021-10-17 LAB — POCT SKIN KOH: Skin KOH, POC: NEGATIVE

## 2021-10-17 MED ORDER — TRIAMCINOLONE ACETONIDE 0.1 % EX CREA: 1.0000 "application " | TOPICAL_CREAM | Freq: Every day | CUTANEOUS | 3 refills | Status: DC | PRN

## 2021-11-05 ENCOUNTER — Encounter: Payer: Self-pay | Admitting: Dermatology

## 2021-11-05 NOTE — Progress Notes (Signed)
° °  New Patient   Subjective  Kristopher Thompson is a 71 y.o. male who presents for the following: Annual Exam (Pt here for a skin exam/Left lower thigh itches and burns x3 months, pt applies vaseline and claims it got smaller and doesn't irritate him for about 3 days /Pt has urastomy pouch and the adhesive pulls his skin and sometimes takes it off. Last occurred today, bleeds a lot /Non mole skin cancer removed 05/12/2013 L cheek C x 3, 5FU).  General skin examination, several areas of concern Location:  Duration:  Quality:  Associated Signs/Symptoms: Modifying Factors:  Severity:  Timing: Context:    The following portions of the chart were reviewed this encounter and updated as appropriate:  Tobacco   Allergies   Meds   Problems   Med Hx   Surg Hx   Fam Hx       Objective  Well appearing patient in no apparent distress; mood and affect are within normal limits. Waist Up Hx of SCC In Situ - L Cheek 2014: No sign recurrence, no atypical pigmented lesions, no new nonmelanoma skin cancer.  Neck - Anterior, Torso - Posterior (Back) Of brown 5 mm textured flattopped papule, typical dermoscopy  Abdomen (Lower Torso, Anterior), Chest (Upper Torso, Anterior), Left Arm, Right Arm Multiple red 1 to 2 mm dermal papules  Left Medial Thigh 1 cm ovoid papulosquamous marginated lesion.  KOH negative.  No fungus on feet.    A full examination was performed including scalp, head, eyes, ears, nose, lips, neck, chest, axillae, abdomen, back, buttocks, bilateral upper extremities, bilateral lower extremities, hands, feet, fingers, toes, fingernails, and toenails. All findings within normal limits unless otherwise noted below.   Assessment & Plan  Screening exam for skin cancer Waist Up  Annual skin examination.  Seborrheic keratosis (2) Neck - Anterior; Torso - Posterior (Back)  Benign, ok to leave if stable  Cherry angioma (4) Chest (Upper Torso, Anterior); Abdomen (Lower Torso,  Anterior); Left Arm; Right Arm  Benign, ok to leave  Skin tag (2) Left Flank; Right Flank  Benign, ok to leave unless pt wants removed   Tinea corporis Left Medial Thigh  Patient told that I cannot always distinguish from fungal versus nonfungal dermatitis.  He will initially use triamcinolone daily after bathing for 3 weeks but if there is spread he will contact me.  POCT Skin KOH - Left Medial Thigh  Culture, fungus without smear - Left Medial Thigh  triamcinolone cream (KENALOG) 0.1 % - Left Medial Thigh Apply 1 application topically daily as needed.

## 2021-11-09 DIAGNOSIS — Z23 Encounter for immunization: Secondary | ICD-10-CM | POA: Diagnosis not present

## 2021-11-17 LAB — CULTURE, FUNGUS WITHOUT SMEAR
MICRO NUMBER:: 12726169
SPECIMEN QUALITY:: ADEQUATE

## 2022-01-10 DIAGNOSIS — R059 Cough, unspecified: Secondary | ICD-10-CM | POA: Diagnosis not present

## 2022-01-10 DIAGNOSIS — J019 Acute sinusitis, unspecified: Secondary | ICD-10-CM | POA: Diagnosis not present

## 2022-01-10 DIAGNOSIS — R0989 Other specified symptoms and signs involving the circulatory and respiratory systems: Secondary | ICD-10-CM | POA: Diagnosis not present

## 2022-01-19 DIAGNOSIS — C61 Malignant neoplasm of prostate: Secondary | ICD-10-CM | POA: Diagnosis not present

## 2022-01-19 DIAGNOSIS — K5730 Diverticulosis of large intestine without perforation or abscess without bleeding: Secondary | ICD-10-CM | POA: Diagnosis not present

## 2022-01-19 DIAGNOSIS — I700 Atherosclerosis of aorta: Secondary | ICD-10-CM | POA: Diagnosis not present

## 2022-01-19 DIAGNOSIS — Z8546 Personal history of malignant neoplasm of prostate: Secondary | ICD-10-CM | POA: Diagnosis not present

## 2022-01-19 DIAGNOSIS — K435 Parastomal hernia without obstruction or  gangrene: Secondary | ICD-10-CM | POA: Diagnosis not present

## 2022-01-19 DIAGNOSIS — I2510 Atherosclerotic heart disease of native coronary artery without angina pectoris: Secondary | ICD-10-CM | POA: Diagnosis not present

## 2022-01-19 DIAGNOSIS — Z8551 Personal history of malignant neoplasm of bladder: Secondary | ICD-10-CM | POA: Diagnosis not present

## 2022-01-24 DIAGNOSIS — C61 Malignant neoplasm of prostate: Secondary | ICD-10-CM | POA: Diagnosis not present

## 2022-02-02 DIAGNOSIS — Z20822 Contact with and (suspected) exposure to covid-19: Secondary | ICD-10-CM | POA: Diagnosis not present

## 2022-03-15 DIAGNOSIS — Z20822 Contact with and (suspected) exposure to covid-19: Secondary | ICD-10-CM | POA: Diagnosis not present

## 2022-04-04 DIAGNOSIS — Z886 Allergy status to analgesic agent status: Secondary | ICD-10-CM | POA: Diagnosis not present

## 2022-04-04 DIAGNOSIS — Z98818 Other dental procedure status: Secondary | ICD-10-CM | POA: Diagnosis not present

## 2022-04-04 DIAGNOSIS — Z48814 Encounter for surgical aftercare following surgery on the teeth or oral cavity: Secondary | ICD-10-CM | POA: Diagnosis not present

## 2022-07-09 DIAGNOSIS — M19072 Primary osteoarthritis, left ankle and foot: Secondary | ICD-10-CM | POA: Diagnosis not present

## 2022-07-09 DIAGNOSIS — M7732 Calcaneal spur, left foot: Secondary | ICD-10-CM | POA: Diagnosis not present

## 2022-07-09 DIAGNOSIS — M19172 Post-traumatic osteoarthritis, left ankle and foot: Secondary | ICD-10-CM | POA: Insufficient documentation

## 2022-07-09 DIAGNOSIS — M7989 Other specified soft tissue disorders: Secondary | ICD-10-CM | POA: Diagnosis not present

## 2022-07-09 DIAGNOSIS — M25572 Pain in left ankle and joints of left foot: Secondary | ICD-10-CM | POA: Diagnosis not present

## 2022-08-15 DIAGNOSIS — Z125 Encounter for screening for malignant neoplasm of prostate: Secondary | ICD-10-CM | POA: Diagnosis not present

## 2022-08-15 DIAGNOSIS — Z79899 Other long term (current) drug therapy: Secondary | ICD-10-CM | POA: Diagnosis not present

## 2022-08-15 DIAGNOSIS — M810 Age-related osteoporosis without current pathological fracture: Secondary | ICD-10-CM | POA: Diagnosis not present

## 2022-08-15 DIAGNOSIS — E7800 Pure hypercholesterolemia, unspecified: Secondary | ICD-10-CM | POA: Diagnosis not present

## 2022-08-15 DIAGNOSIS — C61 Malignant neoplasm of prostate: Secondary | ICD-10-CM | POA: Diagnosis not present

## 2022-08-15 DIAGNOSIS — Z131 Encounter for screening for diabetes mellitus: Secondary | ICD-10-CM | POA: Diagnosis not present

## 2022-08-15 DIAGNOSIS — E039 Hypothyroidism, unspecified: Secondary | ICD-10-CM | POA: Diagnosis not present

## 2022-08-15 DIAGNOSIS — Z8551 Personal history of malignant neoplasm of bladder: Secondary | ICD-10-CM | POA: Diagnosis not present

## 2022-08-15 DIAGNOSIS — R5383 Other fatigue: Secondary | ICD-10-CM | POA: Diagnosis not present

## 2022-08-15 DIAGNOSIS — Z7289 Other problems related to lifestyle: Secondary | ICD-10-CM | POA: Diagnosis not present

## 2022-08-15 DIAGNOSIS — C678 Malignant neoplasm of overlapping sites of bladder: Secondary | ICD-10-CM | POA: Diagnosis not present

## 2022-08-21 DIAGNOSIS — E785 Hyperlipidemia, unspecified: Secondary | ICD-10-CM | POA: Diagnosis not present

## 2022-08-21 DIAGNOSIS — I70219 Atherosclerosis of native arteries of extremities with intermittent claudication, unspecified extremity: Secondary | ICD-10-CM | POA: Diagnosis not present

## 2022-08-21 DIAGNOSIS — E1165 Type 2 diabetes mellitus with hyperglycemia: Secondary | ICD-10-CM | POA: Diagnosis not present

## 2022-08-21 DIAGNOSIS — Z23 Encounter for immunization: Secondary | ICD-10-CM | POA: Diagnosis not present

## 2022-08-21 DIAGNOSIS — R002 Palpitations: Secondary | ICD-10-CM | POA: Diagnosis not present

## 2022-08-21 DIAGNOSIS — D485 Neoplasm of uncertain behavior of skin: Secondary | ICD-10-CM | POA: Diagnosis not present

## 2022-08-21 DIAGNOSIS — Z0000 Encounter for general adult medical examination without abnormal findings: Secondary | ICD-10-CM | POA: Diagnosis not present

## 2022-08-21 DIAGNOSIS — Z1389 Encounter for screening for other disorder: Secondary | ICD-10-CM | POA: Diagnosis not present

## 2022-08-21 DIAGNOSIS — R42 Dizziness and giddiness: Secondary | ICD-10-CM | POA: Diagnosis not present

## 2022-08-21 DIAGNOSIS — Z1331 Encounter for screening for depression: Secondary | ICD-10-CM | POA: Diagnosis not present

## 2022-08-21 DIAGNOSIS — H9113 Presbycusis, bilateral: Secondary | ICD-10-CM | POA: Diagnosis not present

## 2022-08-22 DIAGNOSIS — Z8551 Personal history of malignant neoplasm of bladder: Secondary | ICD-10-CM | POA: Diagnosis not present

## 2022-08-22 DIAGNOSIS — Z8546 Personal history of malignant neoplasm of prostate: Secondary | ICD-10-CM | POA: Diagnosis not present

## 2022-09-27 DIAGNOSIS — H25813 Combined forms of age-related cataract, bilateral: Secondary | ICD-10-CM | POA: Diagnosis not present

## 2022-09-27 DIAGNOSIS — H5203 Hypermetropia, bilateral: Secondary | ICD-10-CM | POA: Diagnosis not present

## 2022-09-27 DIAGNOSIS — E119 Type 2 diabetes mellitus without complications: Secondary | ICD-10-CM | POA: Diagnosis not present

## 2022-09-27 DIAGNOSIS — H35013 Changes in retinal vascular appearance, bilateral: Secondary | ICD-10-CM | POA: Diagnosis not present

## 2022-10-22 DIAGNOSIS — M25562 Pain in left knee: Secondary | ICD-10-CM | POA: Diagnosis not present

## 2022-11-21 DIAGNOSIS — G4733 Obstructive sleep apnea (adult) (pediatric): Secondary | ICD-10-CM | POA: Diagnosis present

## 2023-05-24 ENCOUNTER — Ambulatory Visit (HOSPITAL_COMMUNITY)
Admission: RE | Admit: 2023-05-24 | Discharge: 2023-05-24 | Disposition: A | Discharge status: Home / Self Care | Payer: Medicare Other | Source: Ambulatory Visit | Attending: Nurse Practitioner | Admitting: Nurse Practitioner

## 2023-05-24 DIAGNOSIS — Z436 Encounter for attention to other artificial openings of urinary tract: Secondary | ICD-10-CM | POA: Insufficient documentation

## 2023-05-24 DIAGNOSIS — N99528 Other complication of other external stoma of urinary tract: Secondary | ICD-10-CM | POA: Diagnosis not present

## 2023-05-24 DIAGNOSIS — L24B3 Irritant contact dermatitis related to fecal or urinary stoma or fistula: Secondary | ICD-10-CM

## 2023-05-24 DIAGNOSIS — C679 Malignant neoplasm of bladder, unspecified: Secondary | ICD-10-CM | POA: Diagnosis not present

## 2023-05-24 DIAGNOSIS — K4020 Bilateral inguinal hernia, without obstruction or gangrene, not specified as recurrent: Secondary | ICD-10-CM

## 2023-05-24 DIAGNOSIS — Z936 Other artificial openings of urinary tract status: Secondary | ICD-10-CM | POA: Diagnosis present

## 2023-05-24 NOTE — Discharge Instructions (Signed)
Kristopher [email or web address] Email as needed We switched to 1 piece convex coloplast pouch Barrier ring Stoma powder and skin prep around irritation Stoma belt

## 2023-05-24 NOTE — Progress Notes (Signed)
[facility]   Reason for visit:  RLQ ileal conduit, recent leaks  parastomal hernia.  Medical adhesive related skin injury to pouch perimeter over the last couple weeks.   HPI:  Bladder cancer, open cystectomy and anterior rectal repair with urostomy Past Medical History:  Diagnosis Date   At risk for sleep apnea    12-25-2017   STOP-BANG SCORE= 5   --- SENT TO PCP   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   moderate atypia - right low back   Atypical nevus 04/04/2007   moderate to marked - right upper back (wider shave)   Atypical nevus 04/04/2007   moderate atypia - center chest (wider shave)   Atypical nevus 04/04/2007   slight atypia - right thigh   Atypical nevus 11/29/2011   mild atypia - center upper back   Atypical nevus 11/29/2011   mild atypia - center chest   Bladder cancer [facility]) dx 07/2017   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 denies   Squamous cell carcinoma of skin 05/22/2013   left cheek - CX3 + 5FU   Wears glasses    Family History  Problem Relation Age of Onset   Lung cancer Mother    Hypertension Father    Colon cancer Other    CAD Neg Hx    Diabetes Neg Hx    Stroke Neg Hx    Allergies  Allergen Reactions   Demerol [Meperidine] Nausea And Vomiting and Other (See Comments)    SEVERE NAUSEA   Current Outpatient Medications  Medication Sig Dispense Refill Last Dose   acetaminophen (TYLENOL) 500 MG tablet Take 1,000 mg by mouth every 8 (eight) hours as needed for mild pain or headache.       bisacodyl (DULCOLAX) 5 MG  EC tablet Take 2 tablets (10 mg total) by mouth daily as needed for moderate constipation. (Patient not taking: Reported on 10/17/2021) 20 tablet 0    D-MANNOSE PO Take 1 tablet by mouth daily. (Patient not taking: Reported on 10/17/2021)      esomeprazole (NEXIUM) 20 MG capsule Take 20 mg by mouth daily as needed (Heartburn).      ibuprofen (ADVIL,MOTRIN) 200 MG tablet Take 600 mg by mouth daily as needed for headache or moderate pain.       polyethylene glycol (MIRALAX) 17 g packet Take 17 g by mouth daily.      rosuvastatin (CRESTOR) 5 MG tablet Take 10 mg by mouth daily.      senna (SENOKOT) 8.6 MG TABS tablet Take 1 tablet by mouth daily as needed for mild constipation. (Patient not taking: Reported on 10/17/2021)      triamcinolone cream (KENALOG) 0.1 % Apply 1 application topically daily as needed. 60 g 3    No current facility-administered medications for this encounter.   ROS  Review of Systems  Gastrointestinal:        RLQ ileal conduit, flush Parastomal hernia  Skin:  Positive for color change.  Medical adhesive related skin injury to perimeter of pouching area, switched pouch to tapeless backing pouch system and added convexity  Psychiatric/Behavioral: Negative.    All other systems reviewed and are negative.  Vital signs:  There were no vitals taken for this visit. Exam:  Physical Exam Constitutional:      Appearance: He is obese.  Abdominal:     Palpations: Abdomen is soft.     Hernia: A hernia is present.  Skin:    General: Skin is warm and dry.     Findings: Erythema present.  Neurological:     Mental Status: He is alert and oriented to person, place, and time.  Psychiatric:        Mood and Affect: Mood normal.        Behavior: Behavior normal.     Stoma type/location:  RLQ ileal conduit Stomal assessment/size:  1" round pink and moist, flush Peristomal assessment:  parastomal hernia with medical adhesive related skin injury to pouching perimeter from  adhesive on urostomy barrier.  With bulging hernia, as patient moves, the adhesive is not yielding under moving skin, resulting in trauma and dermatitis.  We will switch to a pouch with no tape backing.   Treatment options for stomal/peristomal skin: 1 piece convex urostomy pouch, coloplast with no tape backing.  Barrier ring to promote seal  stoma powder and skin prep to peristomal irritation.  Added coloplast ostomy belt.   Output: clear yellow urine Ostomy pouching: 1pc.convex with barrier ring.  With new pouch, the irritation is not covered by the barrier.  Stoma powder and skin prep to promote healing Education provided:  rationale for tapeless backing, rationale for convex pouch.  Hernia and need for support from ostomy belt.     Impression/dx  Parastomal hernia Urostomy  Medical adhesive related skin injury Discussion  See above  3 additional pouch sets provided, ostomy belt Plan  Please call or email to let me know if you like this new system, if skin is healing and how I can be of further assistance    Visit time: 50 minutes.   Maple Hudson FNP-BC

## 2023-05-28 DIAGNOSIS — L24B3 Irritant contact dermatitis related to fecal or urinary stoma or fistula: Secondary | ICD-10-CM | POA: Insufficient documentation

## 2023-05-28 DIAGNOSIS — K4020 Bilateral inguinal hernia, without obstruction or gangrene, not specified as recurrent: Secondary | ICD-10-CM | POA: Insufficient documentation

## 2023-05-28 DIAGNOSIS — N99528 Other complication of other external stoma of urinary tract: Secondary | ICD-10-CM | POA: Insufficient documentation

## 2024-03-02 ENCOUNTER — Ambulatory Visit: Payer: Self-pay | Admitting: Surgery

## 2024-03-02 DIAGNOSIS — Z7901 Long term (current) use of anticoagulants: Secondary | ICD-10-CM

## 2024-03-02 DIAGNOSIS — Z86711 Personal history of pulmonary embolism: Secondary | ICD-10-CM | POA: Diagnosis present

## 2024-03-02 DIAGNOSIS — K56600 Partial intestinal obstruction, unspecified as to cause: Secondary | ICD-10-CM | POA: Diagnosis present

## 2024-03-25 NOTE — Progress Notes (Addendum)
 Anesthesia Review:  PCP: DR Dudley Ghee - notes are handwritten and unable to read  Cardiologist : none  Pulmonology- Dr Kenda Paula in [place]  - have requested most recent ov note on 04/01/24- they are to fax  PT states MD is aware of upcoming surgery. LOV 01/28/24- in Media Tab  PPM/ ICD: Device Orders: Rep Notified:  Chest x-ray : EKG : -  08/2023- office of DR Adelene Homer to fax. On 04/01/24.  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    PT takes D-Mannose for urine.  Spoke with Alicia in Triage at [facility] and pt needs to keep taking.  Will inform pt.  PT informed on 04/01/24.   No bowel prep ordered for pt.  Spoke with Alicia in [place] at [facility] due to pt questioning bowel prep and no bowel prep ordered.  . Will inform pt. PT informed on 04/01/24.    Retired Education officer, community

## 2024-03-30 NOTE — Patient Instructions (Signed)
 SURGICAL WAITING ROOM VISITATION  Patients having surgery or a procedure may have no more than 2 support people in the waiting area - these visitors may rotate.    Children under the age of 5 must have an adult with them who is not the patient.  Due to an increase in RSV and influenza rates and associated hospitalizations, children ages 52 and under may not visit patients in [facility] hospitals.  Visitors with respiratory illnesses are discouraged from visiting and should remain at home.  If the patient needs to stay at the hospital during part of their recovery, the visitor guidelines for inpatient rooms apply. Pre-op nurse will coordinate an appropriate time for 1 support person to accompany patient in pre-op.  This support person may not rotate.    Please refer to the [facility] website for the visitor guidelines for Inpatients (after your surgery is over and you are in a regular room).       Your procedure is scheduled on:  04/10/2024    Report to [facility] Main Entrance    Report to admitting at   [phone]   Call this number if you have problems the morning of surgery [phone]   Do not eat food :After Midnight.   After Midnight you may have the following liquids until _ 0430_____ AM DAY OF SURGERY  Water Non-Citrus Juices (without pulp, NO RED-Apple, White grape, White cranberry) Black Coffee (NO MILK/CREAM OR CREAMERS, sugar ok)  Clear Tea (NO MILK/CREAM OR CREAMERS, sugar ok) regular and decaf                             Plain Jell-O (NO RED)                                           Fruit ices (not with fruit pulp, NO RED)                                     Popsicles (NO RED)                                                               Sports drinks like Gatorade (NO RED)              Drink 2 Ensure/G2 drinks AT 10:00 PM the night before surgery.        The day of surgery:  Drink ONE (1) Pre-Surgery Clear Ensure or G2 at  0430 AM ( have completed by  )  the morning of surgery. Drink in one sitting. Do not sip.  This drink was given to you during your hospital  pre-op appointment visit. Nothing else to drink after completing the  Pre-Surgery Clear Ensure or G2.          If you have questions, please contact your surgeon's office.       Oral Hygiene is also important to reduce your risk of infection.  Remember - BRUSH YOUR TEETH THE MORNING OF SURGERY WITH YOUR REGULAR TOOTHPASTE  DENTURES WILL BE REMOVED PRIOR TO SURGERY PLEASE DO NOT APPLY "Poly grip" OR ADHESIVES!!!   Do NOT smoke after Midnight   Stop all vitamins and herbal supplements 7 days before surgery.   Take these medicines the morning of surgery with A SIP OF WATER:  none                Metformin- none am of surgery   DO NOT TAKE ANY ORAL DIABETIC MEDICATIONS DAY OF YOUR SURGERY  Bring CPAP mask and tubing day of surgery.                              You may not have any metal on your body including hair pins, jewelry, and body piercing             Do not wear make-up, lotions, powders, perfumes/cologne, or deodorant  Do not wear nail polish including gel and S&S, artificial/acrylic nails, or any other type of covering on natural nails including finger and toenails. If you have artificial nails, gel coating, etc. that needs to be removed by a nail salon please have this removed prior to surgery or surgery may need to be canceled/ delayed if the surgeon/ anesthesia feels like they are unable to be safely monitored.   Do not shave  48 hours prior to surgery.               Men may shave face and neck.   Do not bring valuables to the hospital. [facility] IS NOT             RESPONSIBLE   FOR VALUABLES.   Contacts, glasses, dentures or bridgework may not be worn into surgery.   Bring small overnight bag day of surgery.   DO NOT BRING YOUR HOME MEDICATIONS TO THE HOSPITAL. PHARMACY WILL DISPENSE MEDICATIONS LISTED ON YOUR  MEDICATION LIST TO YOU DURING YOUR ADMISSION IN THE HOSPITAL!    Patients discharged on the day of surgery will not be allowed to drive home.  Someone NEEDS to stay with you for the first 24 hours after anesthesia.   Special Instructions: Bring a copy of your healthcare power of attorney and living will documents the day of surgery if you haven't scanned them before.              Please read over the following fact sheets you were given: IF YOU HAVE QUESTIONS ABOUT YOUR PRE-OP INSTRUCTIONS PLEASE CALL [phone]   If you received a COVID test during your pre-op visit  it is requested that you wear a mask when out in public, stay away from anyone that may not be feeling well and notify your surgeon if you develop symptoms. If you test positive for Covid or have been in contact with anyone that has tested positive in the last 10 days please notify you surgeon.    [facility] - Preparing for Surgery Before surgery, you can play an important role.  Because skin is not sterile, your skin needs to be as free of germs as possible.  You can reduce the number of germs on your skin by washing with CHG (chlorahexidine gluconate) soap before surgery.  CHG is an antiseptic cleaner which kills germs and bonds with the skin to continue killing germs even after washing. Please DO NOT use if you have an allergy to  CHG or antibacterial soaps.  If your skin becomes reddened/irritated stop using the CHG and inform your nurse when you arrive at Short Stay. Do not shave (including legs and underarms) for at least 48 hours prior to the first CHG shower.  You may shave your face/neck. Please follow these instructions carefully:  1.  Shower with CHG Soap the night before surgery and the  morning of Surgery.  2.  If you choose to wash your hair, wash your hair first as usual with your  normal  shampoo.  3.  After you shampoo, rinse your hair and body thoroughly to remove the  shampoo.                           4.  Use CHG  as you would any other liquid soap.  You can apply chg directly  to the skin and wash                       Gently with a scrungie or clean washcloth.  5.  Apply the CHG Soap to your body ONLY FROM THE NECK DOWN.   Do not use on face/ open                           Wound or open sores. Avoid contact with eyes, ears mouth and genitals (private parts).                       Wash face,  Genitals (private parts) with your normal soap.             6.  Wash thoroughly, paying special attention to the area where your surgery  will be performed.  7.  Thoroughly rinse your body with warm water from the neck down.  8.  DO NOT shower/wash with your normal soap after using and rinsing off  the CHG Soap.                9.  Pat yourself dry with a clean towel.            10.  Wear clean pajamas.            11.  Place clean sheets on your bed the night of your first shower and do not  sleep with pets. Day of Surgery : Do not apply any lotions/deodorants the morning of surgery.  Please wear clean clothes to the hospital/surgery center.  FAILURE TO FOLLOW THESE INSTRUCTIONS MAY RESULT IN THE CANCELLATION OF YOUR SURGERY PATIENT SIGNATURE_________________________________  NURSE SIGNATURE__________________________________  ________________________________________________________________________

## 2024-04-01 ENCOUNTER — Encounter (HOSPITAL_COMMUNITY)
Admission: RE | Admit: 2024-04-01 | Discharge: 2024-04-01 | Disposition: A | Discharge status: Home / Self Care | Source: Ambulatory Visit | Attending: Surgery | Admitting: Surgery

## 2024-04-01 ENCOUNTER — Other Ambulatory Visit: Payer: Self-pay

## 2024-04-01 ENCOUNTER — Encounter (HOSPITAL_COMMUNITY): Payer: Self-pay

## 2024-04-01 VITALS — BP 149/82 | HR 60 | Temp 98.1°F | Resp 16 | Ht 71.0 in | Wt 240.0 lb

## 2024-04-01 DIAGNOSIS — K219 Gastro-esophageal reflux disease without esophagitis: Secondary | ICD-10-CM | POA: Diagnosis not present

## 2024-04-01 DIAGNOSIS — Z7984 Long term (current) use of oral hypoglycemic drugs: Secondary | ICD-10-CM | POA: Diagnosis not present

## 2024-04-01 DIAGNOSIS — Z86711 Personal history of pulmonary embolism: Secondary | ICD-10-CM | POA: Insufficient documentation

## 2024-04-01 DIAGNOSIS — I4510 Unspecified right bundle-branch block: Secondary | ICD-10-CM | POA: Insufficient documentation

## 2024-04-01 DIAGNOSIS — Z87891 Personal history of nicotine dependence: Secondary | ICD-10-CM | POA: Diagnosis not present

## 2024-04-01 DIAGNOSIS — Z8551 Personal history of malignant neoplasm of bladder: Secondary | ICD-10-CM | POA: Insufficient documentation

## 2024-04-01 DIAGNOSIS — Z7901 Long term (current) use of anticoagulants: Secondary | ICD-10-CM | POA: Insufficient documentation

## 2024-04-01 DIAGNOSIS — G4733 Obstructive sleep apnea (adult) (pediatric): Secondary | ICD-10-CM | POA: Diagnosis not present

## 2024-04-01 DIAGNOSIS — E139 Other specified diabetes mellitus without complications: Secondary | ICD-10-CM | POA: Diagnosis not present

## 2024-04-01 DIAGNOSIS — Z8546 Personal history of malignant neoplasm of prostate: Secondary | ICD-10-CM | POA: Insufficient documentation

## 2024-04-01 DIAGNOSIS — Z01818 Encounter for other preprocedural examination: Secondary | ICD-10-CM | POA: Diagnosis not present

## 2024-04-01 DIAGNOSIS — K449 Diaphragmatic hernia without obstruction or gangrene: Secondary | ICD-10-CM | POA: Diagnosis not present

## 2024-04-01 DIAGNOSIS — I5030 Unspecified diastolic (congestive) heart failure: Secondary | ICD-10-CM | POA: Diagnosis not present

## 2024-04-01 DIAGNOSIS — Z9079 Acquired absence of other genital organ(s): Secondary | ICD-10-CM | POA: Diagnosis not present

## 2024-04-01 DIAGNOSIS — K436 Other and unspecified ventral hernia with obstruction, without gangrene: Secondary | ICD-10-CM | POA: Diagnosis not present

## 2024-04-01 HISTORY — DX: Heart failure, unspecified: I50.9

## 2024-04-01 HISTORY — DX: Sleep apnea, unspecified: G47.30

## 2024-04-01 HISTORY — DX: Type 2 diabetes mellitus without complications: E11.9

## 2024-04-01 LAB — CBC
HCT: 49.2 % (ref 39.0–52.0)
Hemoglobin: 15.7 g/dL (ref 13.0–17.0)
MCH: 28.1 pg (ref 26.0–34.0)
MCHC: 31.9 g/dL (ref 30.0–36.0)
MCV: 88 fL (ref 80.0–100.0)
Platelets: 161 10*3/uL (ref 150–400)
RBC: 5.59 MIL/uL (ref 4.22–5.81)
RDW: 14.3 % (ref 11.5–15.5)
WBC: 6.5 10*3/uL (ref 4.0–10.5)
nRBC: 0 % (ref 0.0–0.2)

## 2024-04-01 LAB — BASIC METABOLIC PANEL WITH GFR
Anion gap: 7 (ref 5–15)
BUN: 21 mg/dL (ref 8–23)
CO2: 25 mmol/L (ref 22–32)
Calcium: 9.5 mg/dL (ref 8.9–10.3)
Chloride: 107 mmol/L (ref 98–111)
Creatinine, Ser: 0.97 mg/dL (ref 0.61–1.24)
GFR, Estimated: >60 mL/min (ref >60)
Glucose, Bld: 117 mg/dL — ABNORMAL HIGH (ref 70–99)
Potassium: 4.1 mmol/L (ref 3.5–5.1)
Sodium: 139 mmol/L (ref 135–145)

## 2024-04-01 LAB — HEMOGLOBIN A1C
Hgb A1c MFr Bld: 5.3 % (ref 4.8–5.6)
Mean Plasma Glucose: 105.41 mg/dL

## 2024-04-01 LAB — GLUCOSE, CAPILLARY: Glucose-Capillary: 123 mg/dL — ABNORMAL HIGH (ref 70–99)

## 2024-04-02 ENCOUNTER — Encounter (HOSPITAL_COMMUNITY): Payer: Self-pay

## 2024-04-02 NOTE — Anesthesia Preprocedure Evaluation (Signed)
 Anesthesia Evaluation  Patient identified by MRN, date of birth, ID band Patient awake    Reviewed: Allergy & Precautions, NPO status , Patient's Chart, lab work & pertinent test results  History of Anesthesia Complications Negative for: history of anesthetic complications  Airway Mallampati: III  TM Distance: >3 FB Neck ROM: Full    Dental  (+) Dental Advisory Given   Pulmonary sleep apnea and Continuous Positive Airway Pressure Ventilation , former smoker   breath sounds clear to auscultation       Cardiovascular (-) angina  Rhythm:Regular Rate:Normal  11/2023 ECHO: EF 60%, normal LVF, normal diastolic filling, mod reduced RVF, mild MR   Neuro/Psych negative neurological ROS     GI/Hepatic Neg liver ROS,GERD  Controlled,,H/o co;ostomy for intraabd abscess   Endo/Other  diabetes (glu 104), Oral Hypoglycemic Agents    Renal/GU Renal InsufficiencyRenal disease   Prostate cancer, bladder cancer    Musculoskeletal   Abdominal   Peds  Hematology Eliquis Hb 15.7, plt 161k   Anesthesia Other Findings   Reproductive/Obstetrics                             Anesthesia Physical Anesthesia Plan  ASA: 3  Anesthesia Plan: General   Post-op Pain Management: Tylenol  PO (pre-op)*   Induction: Intravenous  PONV Risk Score and Plan: 2 and Dexamethasone  and Ondansetron   Airway Management Planned: Oral ETT  Additional Equipment: None  Intra-op Plan:   Post-operative Plan: Extubation in OR  Informed Consent: I have reviewed the patients History and Physical, chart, labs and discussed the procedure including the risks, benefits and alternatives for the proposed anesthesia with the patient or authorized representative who has indicated his/her understanding and acceptance.     Dental advisory given  Plan Discussed with: CRNA and Surgeon  Anesthesia Plan Comments: (See PAT note from 5/21)         Anesthesia Quick Evaluation

## 2024-04-02 NOTE — Progress Notes (Signed)
 Case: [id number] Date/Time: 04/10/24 0715   Procedures:      REPAIR, HERNIA, PARASTOMAL, ROBOT-ASSISTED     REPAIR, HERNIA, VENTRAL   Anesthesia type: General   Pre-op diagnosis: PARASTOMAL AND INCISIONAL INCARCERATED ABDOMINAL WALL HERNIA   Location: [facility] ROOM 02 / [facility]   Surgeons: Candyce Champagne, MD       DISCUSSION: Kristopher Thompson is a 73 yo male who presents to PAT prior to surgery above. 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  Pt follows with Pulmonology for hx of PE and OSA on CPAP. Last seen on 01/28/24. PE was unprovoked in 11/2022 and he was recommended to be on indefinite Eliquis in the setting of his cancer hx.  He was cleared for surgery (note scanned in media on 5/21): "ARISCAT: He has an intermediate risk I.e. 13.3% risk of in-hospital postop pulmonary complications".   LD Eliquis: 5/27  VS: BP (!) 149/82   Pulse 60   Temp 36.7 C (Oral)   Resp 16   Ht 5\' 11"  (1.803 m)   Wt 108.9 kg   SpO2 100%   BMI 33.47 kg/m   PROVIDERS: Dudley Ghee, MD Pulmonology- Dr Kenda Paula in [place]   LABS: Labs reviewed: Acceptable for surgery. (all labs ordered are listed, but only abnormal results are displayed)  Labs Reviewed  BASIC METABOLIC PANEL WITH GFR - Abnormal; Notable for the following components:      Result Value   Glucose, Bld 117 (*)    All other components within normal limits  GLUCOSE, CAPILLARY - Abnormal; Notable for the following components:   Glucose-Capillary 123 (*)    All other components within normal limits  HEMOGLOBIN A1C  CBC     IMAGES:   EKG 08/27/23: (scanned in media on 5/21): SR, rate 64 Moderate LAD RBBB  CV:  Echo 11/18/23 (scanned in media on 5/21):  Conclusions: Left ventricle: The cavity size is normal. Wall thickness is mildly increased.  Systolic function is normal. The closest LVEF is 60% by visual estimate. The diastolic function and estimated filling pressure is normal. Mitral valve: There is mild regurgitation Right ventricle: The cavity size is moderately increased. Wall thickness is normal. Systolic function is moderately reduced. Systolic pressure is within the normal range.  Past Medical History:  Diagnosis Date   At risk for sleep apnea    12-25-2017   STOP-BANG SCORE= 5   --- SENT TO PCP   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   moderate atypia - right low back   Atypical nevus 04/04/2007   moderate to marked - right upper back (wider shave)   Atypical nevus 04/04/2007   moderate atypia - center chest (wider shave)   Atypical nevus 04/04/2007   slight atypia - right thigh   Atypical nevus 11/29/2011   mild atypia - center upper back   Atypical nevus 11/29/2011   mild atypia - center chest   Bladder cancer [facility]) dx 07/2017   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  s/p  cystectomy w/ ileal conduit urinary diversion   CHF (congestive heart failure) (HCC)    Colostomy in place [facility])    since 08-08-2017-- per pt 12-25-2017 reddness around stoma   Diabetes mellitus without complication (HCC)    GERD (gastroesophageal reflux disease)    H/O hiatal hernia    History of sepsis 09/2017  dx bacteremia due to klebsiella pneumoniae,  post op intraabdominal abscess   Prostate cancer [facility]) urologist-- dr Rozanne Corners   10-02-2012 s/p  prostatectomy-- Stage T1c   RBBB    Renal disorder    pt. denies   Sleep apnea    cpap   Squamous cell carcinoma of skin 05/22/2013   left cheek - CX3 + 5FU   Wears glasses     Past Surgical History:  Procedure Laterality Date   ABDOMINAL SURGERY     APPENDECTOMY  1972   CHOLECYSTECTOMY  1985   COLONOSCOPY N/A 06/29/2021   Procedure: COLONOSCOPY;  Surgeon: Joyce Nixon, MD;  [facility];  Service: Endoscopy;  Laterality: N/A;   COLOSTOMY REVERSAL N/A 01/08/2018   Procedure:  COLOSTOMY REVERSAL;  Surgeon: Joyce Nixon, MD;  [facility];  Service: General;  Laterality: N/A;   CYSTOSCOPY WITH RETROGRADE PYELOGRAM, URETEROSCOPY AND STENT PLACEMENT Right 06/10/2017   Procedure: CYSTOSCOPY WITH RIGHT URETEROSCOPY WITH RIGHT STENT PLACEMENT;  Surgeon: Florencio Hunting, MD;  [facility];  Service: Urology;  Laterality: Right;   EUS N/A 04/16/2018   Procedure: FULL UPPER ENDOSCOPIC ULTRASOUND (EUS) RADIAL;  Surgeon: Evangeline Hilts, MD;  [facility];  Service: Endoscopy;  Laterality: N/A;   FLEXIBLE SIGMOIDOSCOPY N/A 12/13/2017   Procedure: FLEXIBLE SIGMOIDOSCOPY;  Surgeon: Joyce Nixon, MD;  [facility];  Service: Endoscopy;  Laterality: N/A;   ILEO CONDUIT     IR CATHETER TUBE CHANGE  12/13/2017   IR CATHETER TUBE CHANGE  01/17/2018   IR CATHETER TUBE CHANGE  02/28/2018   IR CATHETER TUBE CHANGE  07/18/2018   IR CATHETER TUBE CHANGE  08/22/2018   IR CATHETER TUBE CHANGE  10/31/2018   IR CONVERT LEFT NEPHROSTOMY TO NEPHROURETERAL CATH  10/24/2017   IR EXT NEPHROURETERAL CATH EXCHANGE  05/19/2018   IR EXT NEPHROURETERAL CATH EXCHANGE  06/16/2018   IR NEPHRO TUBE REMOV/FL  10/24/2017   IR NEPHROSTOGRAM LEFT THRU EXISTING ACCESS  12/05/2018   IR NEPHROSTOMY PLACEMENT LEFT  10/07/2017   IR NEPHROSTOMY TUBE CHANGE  04/11/2018   POLYPECTOMY  06/29/2021   Procedure: POLYPECTOMY;  Surgeon: Joyce Nixon, MD;  [facility];  Service: Endoscopy;;   ROBOT ASSISTED LAPAROSCOPIC RADICAL PROSTATECTOMY  10/02/2012   Procedure: ROBOTIC ASSISTED LAPAROSCOPIC RADICAL PROSTATECTOMY LEVEL 2;  Surgeon: [name], MD;  [facility];  Service: Urology;  Laterality: N/A;   ROBOTIC ASSISTED LAPAROSCOPIC BLADDER DIVERTICULECTOMY N/A 08/08/2017   Procedure: XI ROBOTIC ASSISTED LAPAROSCOPIC RADICAL CYSTECTOMY COVERTED TO OPEN PELVIC LYMPHADNECTOMY BILATERAL AND ILEAL CONDUIT URINARY DIVERSION;  Surgeon: Florencio Hunting, MD;  [facility];  Service: Urology;   Laterality: N/A;   TRANSURETHRAL RESECTION OF BLADDER TUMOR  06/10/2017   Procedure: TRANSURETHRAL RESECTION OF BLADDER TUMOR (TURBT);  Surgeon: Florencio Hunting, MD;  [facility];  Service: Urology;;   XI ROBOT ABDOMINAL PERINEAL RESECTION N/A 08/08/2017   Procedure: REPAIR OF RECTAL TEAR POSSIBLE PARTIAL PROCTECTOMY, CREATION OF  OSTOMY;  Surgeon: Joyce Nixon, MD;  [facility];  Service: General;  Laterality: N/A;    MEDICATIONS:  acetaminophen  (TYLENOL ) 500 MG tablet   bisacodyl  (DULCOLAX) 5 MG EC tablet   Cyanocobalamin  (VITAMIN B-12 PO)   D-MANNOSE PO   diphenhydrAMINE  (BENADRYL ) 25 mg capsule   ELIQUIS 5 MG TABS tablet   ipratropium (ATROVENT) 0.03 % nasal spray   metFORMIN (GLUCOPHAGE) 500 MG tablet   Multiple Vitamins-Minerals (CENTRUM MINIS ADULTS 50+) TABS   Omega-3 Fatty Acids (FISH OIL PO)  polyethylene glycol (MIRALAX ) 17 g packet   rosuvastatin (CRESTOR) 10 MG tablet   triamcinolone  cream (KENALOG ) 0.1 %   No current facility-administered medications for this encounter.   Antoinette Kirschner [facility] Short Stay/Anesthesiology [facility] Phone [phone] 04/02/2024 10:12 AM

## 2024-04-10 ENCOUNTER — Other Ambulatory Visit: Payer: Self-pay

## 2024-04-10 ENCOUNTER — Inpatient Hospital Stay (HOSPITAL_COMMUNITY): Payer: Self-pay | Admitting: Anesthesiology

## 2024-04-10 ENCOUNTER — Inpatient Hospital Stay (HOSPITAL_COMMUNITY)
Admission: RE | Admit: 2024-04-10 | Discharge: 2024-05-22 | DRG: 326 | Disposition: A | Discharge status: Skilled Nursing Facility | Attending: Surgery | Admitting: Surgery

## 2024-04-10 ENCOUNTER — Encounter (HOSPITAL_COMMUNITY)
Admission: RE | Disposition: A | Discharge status: Skilled Nursing Facility | Payer: Self-pay | Source: Home / Self Care | Attending: Surgery

## 2024-04-10 ENCOUNTER — Inpatient Hospital Stay (HOSPITAL_COMMUNITY): Payer: Self-pay | Admitting: Physician Assistant

## 2024-04-10 ENCOUNTER — Encounter (HOSPITAL_COMMUNITY): Payer: Self-pay | Admitting: Surgery

## 2024-04-10 DIAGNOSIS — E8721 Acute metabolic acidosis: Secondary | ICD-10-CM | POA: Diagnosis not present

## 2024-04-10 DIAGNOSIS — I509 Heart failure, unspecified: Secondary | ICD-10-CM | POA: Diagnosis not present

## 2024-04-10 DIAGNOSIS — G9341 Metabolic encephalopathy: Secondary | ICD-10-CM | POA: Diagnosis not present

## 2024-04-10 DIAGNOSIS — E440 Moderate protein-calorie malnutrition: Secondary | ICD-10-CM | POA: Diagnosis not present

## 2024-04-10 DIAGNOSIS — Y848 Other medical procedures as the cause of abnormal reaction of the patient, or of later complication, without mention of misadventure at the time of the procedure: Secondary | ICD-10-CM | POA: Diagnosis not present

## 2024-04-10 DIAGNOSIS — E1165 Type 2 diabetes mellitus with hyperglycemia: Secondary | ICD-10-CM | POA: Diagnosis not present

## 2024-04-10 DIAGNOSIS — K460 Unspecified abdominal hernia with obstruction, without gangrene: Secondary | ICD-10-CM

## 2024-04-10 DIAGNOSIS — Z9889 Other specified postprocedural states: Secondary | ICD-10-CM | POA: Diagnosis not present

## 2024-04-10 DIAGNOSIS — Z01818 Encounter for other preprocedural examination: Secondary | ICD-10-CM

## 2024-04-10 DIAGNOSIS — R739 Hyperglycemia, unspecified: Secondary | ICD-10-CM | POA: Diagnosis not present

## 2024-04-10 DIAGNOSIS — F05 Delirium due to known physiological condition: Secondary | ICD-10-CM | POA: Diagnosis present

## 2024-04-10 DIAGNOSIS — N9989 Other postprocedural complications and disorders of genitourinary system: Secondary | ICD-10-CM | POA: Diagnosis not present

## 2024-04-10 DIAGNOSIS — E876 Hypokalemia: Secondary | ICD-10-CM | POA: Diagnosis not present

## 2024-04-10 DIAGNOSIS — K56600 Partial intestinal obstruction, unspecified as to cause: Secondary | ICD-10-CM | POA: Diagnosis present

## 2024-04-10 DIAGNOSIS — D62 Acute posthemorrhagic anemia: Secondary | ICD-10-CM | POA: Diagnosis not present

## 2024-04-10 DIAGNOSIS — T8144XA Sepsis following a procedure, initial encounter: Secondary | ICD-10-CM | POA: Diagnosis not present

## 2024-04-10 DIAGNOSIS — E66812 Obesity, class 2: Secondary | ICD-10-CM | POA: Diagnosis present

## 2024-04-10 DIAGNOSIS — N179 Acute kidney failure, unspecified: Secondary | ICD-10-CM | POA: Diagnosis present

## 2024-04-10 DIAGNOSIS — K651 Peritoneal abscess: Secondary | ICD-10-CM | POA: Diagnosis not present

## 2024-04-10 DIAGNOSIS — N1832 Chronic kidney disease, stage 3b: Secondary | ICD-10-CM | POA: Diagnosis present

## 2024-04-10 DIAGNOSIS — K631 Perforation of intestine (nontraumatic): Principal | ICD-10-CM | POA: Diagnosis not present

## 2024-04-10 DIAGNOSIS — G4733 Obstructive sleep apnea (adult) (pediatric): Secondary | ICD-10-CM | POA: Diagnosis not present

## 2024-04-10 DIAGNOSIS — B379 Candidiasis, unspecified: Secondary | ICD-10-CM | POA: Diagnosis not present

## 2024-04-10 DIAGNOSIS — T8143XA Infection following a procedure, organ and space surgical site, initial encounter: Secondary | ICD-10-CM | POA: Diagnosis not present

## 2024-04-10 DIAGNOSIS — Z936 Other artificial openings of urinary tract status: Secondary | ICD-10-CM

## 2024-04-10 DIAGNOSIS — R6521 Severe sepsis with septic shock: Secondary | ICD-10-CM | POA: Diagnosis not present

## 2024-04-10 DIAGNOSIS — K567 Ileus, unspecified: Secondary | ICD-10-CM | POA: Diagnosis not present

## 2024-04-10 DIAGNOSIS — R000 Tachycardia, unspecified: Secondary | ICD-10-CM | POA: Diagnosis not present

## 2024-04-10 DIAGNOSIS — C679 Malignant neoplasm of bladder, unspecified: Principal | ICD-10-CM | POA: Diagnosis present

## 2024-04-10 DIAGNOSIS — I130 Hypertensive heart and chronic kidney disease with heart failure and stage 1 through stage 4 chronic kidney disease, or unspecified chronic kidney disease: Secondary | ICD-10-CM | POA: Diagnosis present

## 2024-04-10 DIAGNOSIS — T8110XA Postprocedural shock unspecified, initial encounter: Secondary | ICD-10-CM | POA: Diagnosis not present

## 2024-04-10 DIAGNOSIS — H9190 Unspecified hearing loss, unspecified ear: Secondary | ICD-10-CM | POA: Diagnosis present

## 2024-04-10 DIAGNOSIS — Z8249 Family history of ischemic heart disease and other diseases of the circulatory system: Secondary | ICD-10-CM

## 2024-04-10 DIAGNOSIS — L89152 Pressure ulcer of sacral region, stage 2: Secondary | ICD-10-CM | POA: Diagnosis not present

## 2024-04-10 DIAGNOSIS — R9431 Abnormal electrocardiogram [ECG] [EKG]: Secondary | ICD-10-CM | POA: Diagnosis not present

## 2024-04-10 DIAGNOSIS — Z8546 Personal history of malignant neoplasm of prostate: Secondary | ICD-10-CM

## 2024-04-10 DIAGNOSIS — L24B3 Irritant contact dermatitis related to fecal or urinary stoma or fistula: Secondary | ICD-10-CM

## 2024-04-10 DIAGNOSIS — E872 Acidosis, unspecified: Secondary | ICD-10-CM | POA: Diagnosis not present

## 2024-04-10 DIAGNOSIS — K219 Gastro-esophageal reflux disease without esophagitis: Secondary | ICD-10-CM | POA: Diagnosis not present

## 2024-04-10 DIAGNOSIS — Z87891 Personal history of nicotine dependence: Secondary | ICD-10-CM

## 2024-04-10 DIAGNOSIS — Z7901 Long term (current) use of anticoagulants: Secondary | ICD-10-CM

## 2024-04-10 DIAGNOSIS — Z681 Body mass index (BMI) 19 or less, adult: Secondary | ICD-10-CM

## 2024-04-10 DIAGNOSIS — K433 Parastomal hernia with obstruction, without gangrene: Secondary | ICD-10-CM | POA: Diagnosis not present

## 2024-04-10 DIAGNOSIS — J9589 Other postprocedural complications and disorders of respiratory system, not elsewhere classified: Secondary | ICD-10-CM | POA: Diagnosis not present

## 2024-04-10 DIAGNOSIS — K435 Parastomal hernia without obstruction or  gangrene: Secondary | ICD-10-CM | POA: Diagnosis present

## 2024-04-10 DIAGNOSIS — L7632 Postprocedural hematoma of skin and subcutaneous tissue following other procedure: Secondary | ICD-10-CM | POA: Diagnosis not present

## 2024-04-10 DIAGNOSIS — N39498 Other specified urinary incontinence: Secondary | ICD-10-CM | POA: Diagnosis not present

## 2024-04-10 DIAGNOSIS — Z9079 Acquired absence of other genital organ(s): Secondary | ICD-10-CM

## 2024-04-10 DIAGNOSIS — Z932 Ileostomy status: Secondary | ICD-10-CM | POA: Diagnosis not present

## 2024-04-10 DIAGNOSIS — R0682 Tachypnea, not elsewhere classified: Secondary | ICD-10-CM | POA: Diagnosis present

## 2024-04-10 DIAGNOSIS — E8809 Other disorders of plasma-protein metabolism, not elsewhere classified: Secondary | ICD-10-CM | POA: Diagnosis present

## 2024-04-10 DIAGNOSIS — B952 Enterococcus as the cause of diseases classified elsewhere: Secondary | ICD-10-CM | POA: Diagnosis not present

## 2024-04-10 DIAGNOSIS — E875 Hyperkalemia: Secondary | ICD-10-CM | POA: Diagnosis not present

## 2024-04-10 DIAGNOSIS — Z8551 Personal history of malignant neoplasm of bladder: Secondary | ICD-10-CM

## 2024-04-10 DIAGNOSIS — B9689 Other specified bacterial agents as the cause of diseases classified elsewhere: Secondary | ICD-10-CM | POA: Diagnosis not present

## 2024-04-10 DIAGNOSIS — Z9049 Acquired absence of other specified parts of digestive tract: Secondary | ICD-10-CM

## 2024-04-10 DIAGNOSIS — B9620 Unspecified Escherichia coli [E. coli] as the cause of diseases classified elsewhere: Secondary | ICD-10-CM | POA: Diagnosis not present

## 2024-04-10 DIAGNOSIS — K4020 Bilateral inguinal hernia, without obstruction or gangrene, not specified as recurrent: Secondary | ICD-10-CM | POA: Diagnosis not present

## 2024-04-10 DIAGNOSIS — C61 Malignant neoplasm of prostate: Secondary | ICD-10-CM | POA: Diagnosis present

## 2024-04-10 DIAGNOSIS — K5651 Intestinal adhesions [bands], with partial obstruction: Secondary | ICD-10-CM | POA: Diagnosis present

## 2024-04-10 DIAGNOSIS — N1330 Unspecified hydronephrosis: Secondary | ICD-10-CM | POA: Diagnosis not present

## 2024-04-10 DIAGNOSIS — Z6833 Body mass index (BMI) 33.0-33.9, adult: Secondary | ICD-10-CM

## 2024-04-10 DIAGNOSIS — K430 Incisional hernia with obstruction, without gangrene: Secondary | ICD-10-CM | POA: Diagnosis present

## 2024-04-10 DIAGNOSIS — A419 Sepsis, unspecified organism: Secondary | ICD-10-CM | POA: Diagnosis not present

## 2024-04-10 DIAGNOSIS — S301XXA Contusion of abdominal wall, initial encounter: Secondary | ICD-10-CM | POA: Diagnosis not present

## 2024-04-10 DIAGNOSIS — R471 Dysarthria and anarthria: Secondary | ICD-10-CM | POA: Diagnosis not present

## 2024-04-10 DIAGNOSIS — L03311 Cellulitis of abdominal wall: Secondary | ICD-10-CM | POA: Diagnosis not present

## 2024-04-10 DIAGNOSIS — Z7984 Long term (current) use of oral hypoglycemic drugs: Secondary | ICD-10-CM

## 2024-04-10 DIAGNOSIS — Z79899 Other long term (current) drug therapy: Secondary | ICD-10-CM

## 2024-04-10 DIAGNOSIS — J9602 Acute respiratory failure with hypercapnia: Secondary | ICD-10-CM | POA: Diagnosis not present

## 2024-04-10 DIAGNOSIS — I5030 Unspecified diastolic (congestive) heart failure: Secondary | ICD-10-CM | POA: Diagnosis not present

## 2024-04-10 DIAGNOSIS — N170 Acute kidney failure with tubular necrosis: Secondary | ICD-10-CM | POA: Diagnosis not present

## 2024-04-10 DIAGNOSIS — Z85828 Personal history of other malignant neoplasm of skin: Secondary | ICD-10-CM

## 2024-04-10 DIAGNOSIS — G4700 Insomnia, unspecified: Secondary | ICD-10-CM | POA: Diagnosis present

## 2024-04-10 DIAGNOSIS — G4730 Sleep apnea, unspecified: Secondary | ICD-10-CM | POA: Diagnosis not present

## 2024-04-10 DIAGNOSIS — E669 Obesity, unspecified: Secondary | ICD-10-CM | POA: Diagnosis present

## 2024-04-10 DIAGNOSIS — E119 Type 2 diabetes mellitus without complications: Secondary | ICD-10-CM

## 2024-04-10 DIAGNOSIS — G9340 Encephalopathy, unspecified: Secondary | ICD-10-CM | POA: Diagnosis not present

## 2024-04-10 DIAGNOSIS — I5032 Chronic diastolic (congestive) heart failure: Secondary | ICD-10-CM | POA: Diagnosis present

## 2024-04-10 DIAGNOSIS — E1122 Type 2 diabetes mellitus with diabetic chronic kidney disease: Secondary | ICD-10-CM | POA: Diagnosis present

## 2024-04-10 DIAGNOSIS — Z792 Long term (current) use of antibiotics: Secondary | ICD-10-CM

## 2024-04-10 DIAGNOSIS — Y92239 Unspecified place in hospital as the place of occurrence of the external cause: Secondary | ICD-10-CM | POA: Diagnosis not present

## 2024-04-10 DIAGNOSIS — E861 Hypovolemia: Secondary | ICD-10-CM | POA: Diagnosis not present

## 2024-04-10 DIAGNOSIS — J9601 Acute respiratory failure with hypoxia: Secondary | ICD-10-CM | POA: Diagnosis not present

## 2024-04-10 DIAGNOSIS — I2510 Atherosclerotic heart disease of native coronary artery without angina pectoris: Secondary | ICD-10-CM | POA: Diagnosis present

## 2024-04-10 DIAGNOSIS — E785 Hyperlipidemia, unspecified: Secondary | ICD-10-CM | POA: Diagnosis present

## 2024-04-10 DIAGNOSIS — Z906 Acquired absence of other parts of urinary tract: Secondary | ICD-10-CM

## 2024-04-10 DIAGNOSIS — R54 Age-related physical debility: Secondary | ICD-10-CM | POA: Diagnosis present

## 2024-04-10 DIAGNOSIS — L8990 Pressure ulcer of unspecified site, unspecified stage: Secondary | ICD-10-CM | POA: Diagnosis not present

## 2024-04-10 DIAGNOSIS — Z781 Physical restraint status: Secondary | ICD-10-CM

## 2024-04-10 DIAGNOSIS — Z86711 Personal history of pulmonary embolism: Secondary | ICD-10-CM | POA: Diagnosis present

## 2024-04-10 HISTORY — PX: VENTRAL HERNIA REPAIR: SHX424

## 2024-04-10 HISTORY — DX: Candidiasis, unspecified: B37.9

## 2024-04-10 HISTORY — PX: XI ROBOTIC ASSISTED PARASTOMAL HERNIA REPAIR: SHX6540

## 2024-04-10 LAB — GLUCOSE, CAPILLARY
Glucose-Capillary: 104 mg/dL — ABNORMAL HIGH (ref 70–99)
Glucose-Capillary: 135 mg/dL — ABNORMAL HIGH (ref 70–99)
Glucose-Capillary: 163 mg/dL — ABNORMAL HIGH (ref 70–99)
Glucose-Capillary: 180 mg/dL — ABNORMAL HIGH (ref 70–99)
Glucose-Capillary: 151 mg/dL — ABNORMAL HIGH (ref 70–99)
Glucose-Capillary: 141 mg/dL — ABNORMAL HIGH (ref 70–99)
Glucose-Capillary: 179 mg/dL — ABNORMAL HIGH (ref 70–99)

## 2024-04-10 SURGERY — REPAIR, HERNIA, PARASTOMAL, ROBOT-ASSISTED
Anesthesia: General

## 2024-04-10 MED ORDER — LACTATED RINGERS IV SOLN
Freq: Three times a day (TID) | INTRAVENOUS | Status: DC | PRN
Start: 2024-04-10 — End: 2024-04-12

## 2024-04-10 MED ORDER — INSULIN ASPART 100 UNIT/ML IJ SOLN
0.0000 [IU] | Freq: Three times a day (TID) | INTRAMUSCULAR | Status: DC
  Administered 2024-04-11 – 2024-04-12 (×4): 2 [IU] via SUBCUTANEOUS
  Administered 2024-04-12 – 2024-04-13 (×2): 3 [IU] via SUBCUTANEOUS

## 2024-04-10 MED ORDER — ONDANSETRON HCL 4 MG/2ML IJ SOLN
INTRAMUSCULAR | Status: DC | PRN
  Administered 2024-04-10: 4 mg via INTRAVENOUS

## 2024-04-10 MED ORDER — SODIUM CHLORIDE 0.9% FLUSH
3.0000 mL | Freq: Two times a day (BID) | INTRAVENOUS | Status: DC
  Administered 2024-04-10 – 2024-04-17 (×11): 3 mL via INTRAVENOUS

## 2024-04-10 MED ORDER — INSULIN ASPART 100 UNIT/ML IJ SOLN: 0.0000 [IU] | Freq: Every day | INTRAMUSCULAR | Status: DC

## 2024-04-10 MED ORDER — SIMETHICONE 80 MG PO CHEW
40.0000 mg | CHEWABLE_TABLET | Freq: Four times a day (QID) | ORAL | Status: DC | PRN
  Administered 2024-04-12: 40 mg via ORAL
  Filled 2024-04-10: qty 1

## 2024-04-10 MED ORDER — FENTANYL CITRATE (PF) 250 MCG/5ML IJ SOLN
INTRAMUSCULAR | Status: AC
  Filled 2024-04-10: qty 5

## 2024-04-10 MED ORDER — DIPHENHYDRAMINE HCL 12.5 MG/5ML PO ELIX
12.5000 mg | ORAL_SOLUTION | Freq: Four times a day (QID) | ORAL | Status: DC | PRN
  Administered 2024-04-16: 12.5 mg via ORAL
  Filled 2024-04-10: qty 5

## 2024-04-10 MED ORDER — HYDROMORPHONE HCL 1 MG/ML IJ SOLN
INTRAMUSCULAR | Status: AC
  Filled 2024-04-10: qty 2

## 2024-04-10 MED ORDER — PROPOFOL 10 MG/ML IV BOLUS
INTRAVENOUS | Status: AC
  Filled 2024-04-10: qty 20

## 2024-04-10 MED ORDER — ENOXAPARIN SODIUM 40 MG/0.4ML IJ SOSY
40.0000 mg | PREFILLED_SYRINGE | INTRAMUSCULAR | Status: DC
  Administered 2024-04-11 – 2024-04-13 (×3): 40 mg via SUBCUTANEOUS
  Filled 2024-04-10 (×4): qty 0.4

## 2024-04-10 MED ORDER — MAGNESIUM HYDROXIDE 400 MG/5ML PO SUSP
30.0000 mL | Freq: Every day | ORAL | Status: DC | PRN
  Filled 2024-04-10: qty 30

## 2024-04-10 MED ORDER — SALINE SPRAY 0.65 % NA SOLN: 1.0000 | Freq: Four times a day (QID) | NASAL | Status: DC | PRN

## 2024-04-10 MED ORDER — ACETAMINOPHEN 500 MG PO TABS
1000.0000 mg | ORAL_TABLET | ORAL | Status: AC
  Administered 2024-04-10: 1000 mg via ORAL
  Filled 2024-04-10: qty 2

## 2024-04-10 MED ORDER — PHENOL 1.4 % MT LIQD: 2.0000 | OROMUCOSAL | Status: DC | PRN

## 2024-04-10 MED ORDER — NAPHAZOLINE-GLYCERIN 0.012-0.25 % OP SOLN
1.0000 [drp] | Freq: Four times a day (QID) | OPHTHALMIC | Status: DC | PRN
  Filled 2024-04-10: qty 15

## 2024-04-10 MED ORDER — MIDAZOLAM HCL 5 MG/5ML IJ SOLN
INTRAMUSCULAR | Status: DC | PRN
  Administered 2024-04-10: 2 mg via INTRAVENOUS

## 2024-04-10 MED ORDER — METOCLOPRAMIDE HCL 5 MG/ML IJ SOLN: 5.0000 mg | Freq: Three times a day (TID) | INTRAMUSCULAR | Status: DC | PRN

## 2024-04-10 MED ORDER — SODIUM CHLORIDE 0.9 % IV SOLN
INTRAVENOUS | Status: DC | PRN
Start: 2024-04-10 — End: 2024-04-10

## 2024-04-10 MED ORDER — CEFAZOLIN SODIUM 1 G IJ SOLR
INTRAMUSCULAR | Status: AC
  Filled 2024-04-10: qty 10

## 2024-04-10 MED ORDER — ORAL CARE MOUTH RINSE: 15.0000 mL | Freq: Once | OROMUCOSAL | Status: AC

## 2024-04-10 MED ORDER — ROSUVASTATIN CALCIUM 10 MG PO TABS
10.0000 mg | ORAL_TABLET | Freq: Every evening | ORAL | Status: DC
  Administered 2024-04-10 – 2024-04-12 (×3): 10 mg via ORAL
  Filled 2024-04-10 (×3): qty 1

## 2024-04-10 MED ORDER — DEXAMETHASONE SODIUM PHOSPHATE 10 MG/ML IJ SOLN
INTRAMUSCULAR | Status: AC
Start: 2024-04-10 — End: ?
  Filled 2024-04-10: qty 1

## 2024-04-10 MED ORDER — SUGAMMADEX SODIUM 200 MG/2ML IV SOLN
INTRAVENOUS | Status: DC | PRN
  Administered 2024-04-10: 200 mg via INTRAVENOUS

## 2024-04-10 MED ORDER — BUPIVACAINE LIPOSOME 1.3 % IJ SUSP
INTRAMUSCULAR | Status: AC
Start: 2024-04-10 — End: ?
  Filled 2024-04-10: qty 20

## 2024-04-10 MED ORDER — CEFAZOLIN SODIUM-DEXTROSE 2-4 GM/100ML-% IV SOLN
INTRAVENOUS | Status: AC
  Filled 2024-04-10: qty 100

## 2024-04-10 MED ORDER — SODIUM CHLORIDE 0.9% FLUSH: 3.0000 mL | INTRAVENOUS | Status: DC | PRN

## 2024-04-10 MED ORDER — DEXMEDETOMIDINE HCL IN NACL 80 MCG/20ML IV SOLN
INTRAVENOUS | Status: AC
  Filled 2024-04-10: qty 20

## 2024-04-10 MED ORDER — SODIUM CHLORIDE 0.9 % IV BOLUS
500.0000 mL | Freq: Once | INTRAVENOUS | Status: AC
  Administered 2024-04-10: 500 mL via INTRAVENOUS

## 2024-04-10 MED ORDER — PHENYLEPHRINE HCL-NACL 20-0.9 MG/250ML-% IV SOLN
INTRAVENOUS | Status: DC | PRN
  Administered 2024-04-10: 50 ug/min via INTRAVENOUS

## 2024-04-10 MED ORDER — METOPROLOL TARTRATE 5 MG/5ML IV SOLN
5.0000 mg | Freq: Four times a day (QID) | INTRAVENOUS | Status: DC | PRN
  Filled 2024-04-10: qty 5

## 2024-04-10 MED ORDER — ONDANSETRON HCL 4 MG/2ML IJ SOLN
INTRAMUSCULAR | Status: AC
  Filled 2024-04-10: qty 2

## 2024-04-10 MED ORDER — PROPOFOL 10 MG/ML IV BOLUS
INTRAVENOUS | Status: DC | PRN
  Administered 2024-04-10: 150 mg via INTRAVENOUS
  Administered 2024-04-10: 50 mg via INTRAVENOUS

## 2024-04-10 MED ORDER — CHLORHEXIDINE GLUCONATE CLOTH 2 % EX PADS: 6.0000 | MEDICATED_PAD | Freq: Once | CUTANEOUS | Status: DC

## 2024-04-10 MED ORDER — ENSURE PRE-SURGERY PO LIQD: 296.0000 mL | Freq: Once | ORAL | Status: DC

## 2024-04-10 MED ORDER — BISACODYL 5 MG PO TBEC
15.0000 mg | DELAYED_RELEASE_TABLET | Freq: Every day | ORAL | Status: DC
  Administered 2024-04-10 – 2024-04-12 (×3): 15 mg via ORAL
  Filled 2024-04-10 (×3): qty 3

## 2024-04-10 MED ORDER — ROCURONIUM BROMIDE 100 MG/10ML IV SOLN: INTRAVENOUS | Status: DC | PRN

## 2024-04-10 MED ORDER — ALBUMIN HUMAN 5 % IV SOLN
INTRAVENOUS | Status: AC
  Filled 2024-04-10: qty 250

## 2024-04-10 MED ORDER — FENTANYL CITRATE (PF) 250 MCG/5ML IJ SOLN
INTRAMUSCULAR | Status: DC | PRN
  Administered 2024-04-10 (×5): 50 ug via INTRAVENOUS

## 2024-04-10 MED ORDER — HYDROMORPHONE HCL 1 MG/ML IJ SOLN
0.2500 mg | INTRAMUSCULAR | Status: DC | PRN
  Administered 2024-04-10: 0.5 mg via INTRAVENOUS

## 2024-04-10 MED ORDER — SODIUM CHLORIDE 0.9 % IV SOLN
250.0000 mL | INTRAVENOUS | Status: DC | PRN
  Administered 2024-04-20: 250 mL via INTRAVENOUS

## 2024-04-10 MED ORDER — OXYCODONE HCL 5 MG/5ML PO SOLN: 5.0000 mg | Freq: Once | ORAL | Status: DC | PRN

## 2024-04-10 MED ORDER — CALCIUM POLYCARBOPHIL 625 MG PO TABS
625.0000 mg | ORAL_TABLET | Freq: Two times a day (BID) | ORAL | Status: DC
  Administered 2024-04-10 – 2024-04-12 (×5): 625 mg via ORAL
  Filled 2024-04-10 (×5): qty 1

## 2024-04-10 MED ORDER — DIPHENHYDRAMINE HCL 50 MG/ML IJ SOLN: 12.5000 mg | Freq: Four times a day (QID) | INTRAMUSCULAR | Status: DC | PRN

## 2024-04-10 MED ORDER — LACTATED RINGERS IR SOLN
Status: DC | PRN
  Administered 2024-04-10 (×2): 1000 mL

## 2024-04-10 MED ORDER — LACTATED RINGERS IV SOLN: INTRAVENOUS | Status: DC

## 2024-04-10 MED ORDER — MENTHOL 3 MG MT LOZG: 1.0000 | LOZENGE | OROMUCOSAL | Status: DC | PRN

## 2024-04-10 MED ORDER — LIDOCAINE HCL (CARDIAC) PF 100 MG/5ML IV SOSY
PREFILLED_SYRINGE | INTRAVENOUS | Status: DC | PRN
  Administered 2024-04-10: 100 mg via INTRAVENOUS

## 2024-04-10 MED ORDER — ROCURONIUM BROMIDE 10 MG/ML (PF) SYRINGE
PREFILLED_SYRINGE | INTRAVENOUS | Status: DC | PRN
Start: 2024-04-10 — End: 2024-04-10
  Administered 2024-04-10: 10 mg via INTRAVENOUS
  Administered 2024-04-10: 20 mg via INTRAVENOUS
  Administered 2024-04-10: 10 mg via INTRAVENOUS
  Administered 2024-04-10: 60 mg via INTRAVENOUS
  Administered 2024-04-10 (×6): 10 mg via INTRAVENOUS
  Administered 2024-04-10: 20 mg via INTRAVENOUS
  Administered 2024-04-10: 10 mg via INTRAVENOUS
  Administered 2024-04-10: 20 mg via INTRAVENOUS

## 2024-04-10 MED ORDER — PROCHLORPERAZINE EDISYLATE 10 MG/2ML IJ SOLN
5.0000 mg | Freq: Four times a day (QID) | INTRAMUSCULAR | Status: DC | PRN
  Administered 2024-04-13: 5 mg via INTRAVENOUS
  Administered 2024-04-15: 10 mg via INTRAVENOUS
  Filled 2024-04-10 (×2): qty 2

## 2024-04-10 MED ORDER — ENSURE PRE-SURGERY PO LIQD: 592.0000 mL | Freq: Once | ORAL | Status: DC

## 2024-04-10 MED ORDER — OXYCODONE HCL 5 MG PO TABS: 5.0000 mg | ORAL_TABLET | Freq: Once | ORAL | Status: DC | PRN

## 2024-04-10 MED ORDER — ALBUMIN HUMAN 5 % IV SOLN: INTRAVENOUS | Status: DC | PRN

## 2024-04-10 MED ORDER — HYDROMORPHONE HCL 1 MG/ML IJ SOLN
0.5000 mg | INTRAMUSCULAR | Status: DC | PRN
  Administered 2024-04-11: 1 mg via INTRAVENOUS
  Filled 2024-04-10: qty 1

## 2024-04-10 MED ORDER — CEFAZOLIN SODIUM 1 G IJ SOLR
INTRAMUSCULAR | Status: AC
Start: 2024-04-10 — End: ?
  Filled 2024-04-10: qty 10

## 2024-04-10 MED ORDER — GABAPENTIN 100 MG PO CAPS
300.0000 mg | ORAL_CAPSULE | Freq: Two times a day (BID) | ORAL | Status: DC
Start: 2024-04-10 — End: 2024-04-13
  Administered 2024-04-10 – 2024-04-12 (×5): 300 mg via ORAL
  Filled 2024-04-10 (×5): qty 3

## 2024-04-10 MED ORDER — BUPIVACAINE-EPINEPHRINE (PF) 0.25% -1:200000 IJ SOLN
INTRAMUSCULAR | Status: AC
  Filled 2024-04-10: qty 30

## 2024-04-10 MED ORDER — MIDAZOLAM HCL 2 MG/2ML IJ SOLN
INTRAMUSCULAR | Status: AC
  Filled 2024-04-10: qty 2

## 2024-04-10 MED ORDER — LIDOCAINE HCL (PF) 2 % IJ SOLN
INTRAMUSCULAR | Status: AC
Start: 2024-04-10 — End: ?
  Filled 2024-04-10: qty 5

## 2024-04-10 MED ORDER — INDOCYANINE GREEN 25 MG IV SOLR
INTRAVENOUS | Status: DC | PRN
  Administered 2024-04-10: 7.5 mg via INTRAVENOUS

## 2024-04-10 MED ORDER — INSULIN ASPART 100 UNIT/ML IJ SOLN
0.0000 [IU] | INTRAMUSCULAR | Status: DC | PRN
  Administered 2024-04-10: 2 [IU] via SUBCUTANEOUS

## 2024-04-10 MED ORDER — ROCURONIUM BROMIDE 10 MG/ML (PF) SYRINGE
PREFILLED_SYRINGE | INTRAVENOUS | Status: AC
  Filled 2024-04-10: qty 10

## 2024-04-10 MED ORDER — OXYCODONE HCL 5 MG PO TABS
5.0000 mg | ORAL_TABLET | ORAL | Status: DC | PRN
  Administered 2024-04-10: 5 mg via ORAL
  Administered 2024-04-11 – 2024-04-12 (×3): 10 mg via ORAL
  Administered 2024-04-12: 5 mg via ORAL
  Administered 2024-04-12: 10 mg via ORAL
  Filled 2024-04-10 (×2): qty 2
  Filled 2024-04-10 (×2): qty 1
  Filled 2024-04-10 (×2): qty 2

## 2024-04-10 MED ORDER — OXYCODONE-ACETAMINOPHEN 5-325 MG PO TABS: 1.0000 | ORAL_TABLET | Freq: Four times a day (QID) | ORAL | 0 refills | Status: DC | PRN

## 2024-04-10 MED ORDER — IPRATROPIUM BROMIDE 0.03 % NA SOLN: 1.0000 | Freq: Three times a day (TID) | NASAL | Status: DC | PRN

## 2024-04-10 MED ORDER — PROCHLORPERAZINE MALEATE 10 MG PO TABS: 10.0000 mg | ORAL_TABLET | Freq: Four times a day (QID) | ORAL | Status: DC | PRN

## 2024-04-10 MED ORDER — CEFAZOLIN SODIUM-DEXTROSE 2-4 GM/100ML-% IV SOLN
2.0000 g | INTRAVENOUS | Status: AC
  Administered 2024-04-10 (×3): 2 g via INTRAVENOUS
  Filled 2024-04-10: qty 100

## 2024-04-10 MED ORDER — CEFAZOLIN SODIUM-DEXTROSE 2-4 GM/100ML-% IV SOLN
2.0000 g | Freq: Three times a day (TID) | INTRAVENOUS | Status: AC
  Administered 2024-04-10 – 2024-04-11 (×2): 2 g via INTRAVENOUS
  Filled 2024-04-10 (×2): qty 100

## 2024-04-10 MED ORDER — MIDAZOLAM HCL 2 MG/2ML IJ SOLN: 0.5000 mg | Freq: Once | INTRAMUSCULAR | Status: DC | PRN

## 2024-04-10 MED ORDER — BUPIVACAINE LIPOSOME 1.3 % IJ SUSP
INTRAMUSCULAR | Status: DC | PRN
Start: 2024-04-10 — End: 2024-04-10
  Administered 2024-04-10: 20 mL

## 2024-04-10 MED ORDER — ACETAMINOPHEN 500 MG PO TABS
1000.0000 mg | ORAL_TABLET | Freq: Four times a day (QID) | ORAL | Status: DC
  Administered 2024-04-10 – 2024-04-13 (×10): 1000 mg via ORAL
  Filled 2024-04-10 (×11): qty 2

## 2024-04-10 MED ORDER — MAGIC MOUTHWASH
15.0000 mL | Freq: Four times a day (QID) | ORAL | Status: DC | PRN
Start: 2024-04-10 — End: 2024-04-14

## 2024-04-10 MED ORDER — ADULT MULTIVITAMIN W/MINERALS CH
1.0000 | ORAL_TABLET | ORAL | Status: DC
  Administered 2024-04-11: 1 via ORAL
  Filled 2024-04-10: qty 1

## 2024-04-10 MED ORDER — BISACODYL 10 MG RE SUPP: 10.0000 mg | Freq: Two times a day (BID) | RECTAL | Status: DC | PRN

## 2024-04-10 MED ORDER — METHOCARBAMOL 1000 MG/10ML IJ SOLN
1000.0000 mg | Freq: Four times a day (QID) | INTRAMUSCULAR | Status: DC | PRN
  Administered 2024-04-10: 1000 mg via INTRAVENOUS
  Filled 2024-04-10: qty 10

## 2024-04-10 MED ORDER — CYCLOBENZAPRINE HCL 5 MG PO TABS: 5.0000 mg | ORAL_TABLET | Freq: Three times a day (TID) | ORAL | 1 refills | Status: DC | PRN

## 2024-04-10 MED ORDER — METHOCARBAMOL 500 MG PO TABS
1000.0000 mg | ORAL_TABLET | Freq: Four times a day (QID) | ORAL | Status: DC | PRN
  Administered 2024-04-11 – 2024-04-12 (×2): 1000 mg via ORAL
  Filled 2024-04-10 (×2): qty 2

## 2024-04-10 MED ORDER — BUPIVACAINE LIPOSOME 1.3 % IJ SUSP: 20.0000 mL | Freq: Once | INTRAMUSCULAR | Status: DC

## 2024-04-10 MED ORDER — DEXAMETHASONE SODIUM PHOSPHATE 10 MG/ML IJ SOLN
INTRAMUSCULAR | Status: DC | PRN
Start: 2024-04-10 — End: 2024-04-10
  Administered 2024-04-10: 4 mg via INTRAVENOUS

## 2024-04-10 MED ORDER — BUPIVACAINE-EPINEPHRINE 0.25% -1:200000 IJ SOLN
INTRAMUSCULAR | Status: DC | PRN
Start: 2024-04-10 — End: 2024-04-10
  Administered 2024-04-10: 60 mL

## 2024-04-10 MED ORDER — CHLORHEXIDINE GLUCONATE 0.12 % MT SOLN
15.0000 mL | Freq: Once | OROMUCOSAL | Status: AC
  Administered 2024-04-10: 15 mL via OROMUCOSAL

## 2024-04-10 MED ORDER — KCL IN DEXTROSE-NACL 20-5-0.45 MEQ/L-%-% IV SOLN
INTRAVENOUS | Status: AC
  Filled 2024-04-10: qty 1000

## 2024-04-10 MED ORDER — GABAPENTIN 300 MG PO CAPS
300.0000 mg | ORAL_CAPSULE | ORAL | Status: AC
  Administered 2024-04-10: 300 mg via ORAL
  Filled 2024-04-10: qty 1

## 2024-04-10 SURGICAL SUPPLY — 71 items
APPLICATOR COTTON TIP 6 STRL (MISCELLANEOUS) ×1 IMPLANT
APPLICATOR COTTON TIP 6IN STRL (MISCELLANEOUS) ×1 IMPLANT
BAG COUNTER SPONGE SURGICOUNT (BAG) ×1 IMPLANT
BINDER ABDOMINAL 12 ML 46-62 (SOFTGOODS) IMPLANT
BLADE SURG SZ11 CARB STEEL (BLADE) ×1 IMPLANT
CATH FOLEY 2WAY SLVR 5CC 18FR (CATHETERS) IMPLANT
CHLORAPREP W/TINT 26 (MISCELLANEOUS) ×1 IMPLANT
CLIP APPLIE 5 13 M/L LIGAMAX5 (MISCELLANEOUS) IMPLANT
COVER SURGICAL LIGHT HANDLE (MISCELLANEOUS) ×1 IMPLANT
COVER TIP SHEARS 8 DVNC (MISCELLANEOUS) IMPLANT
DEFOGGER SCOPE WARM SEASHARP (MISCELLANEOUS) IMPLANT
DEVICE SECURE STRAP 25 ABSORB (INSTRUMENTS) IMPLANT
DRAIN CHANNEL 19F RND (DRAIN) IMPLANT
DRAPE ARM DVNC X/XI (DISPOSABLE) ×4 IMPLANT
DRAPE COLUMN DVNC XI (DISPOSABLE) ×1 IMPLANT
DRAPE INCISE 23X17 STRL (DRAPES) IMPLANT
DRAPE INCISE IOBAN 23X17 STRL (DRAPES) ×1 IMPLANT
DRAPE LAPAROSCOPIC ABDOMINAL (DRAPES) ×1 IMPLANT
DRIVER NDL LRG 8 DVNC XI (INSTRUMENTS) IMPLANT
DRIVER NDL MEGA SUTCUT DVNCXI (INSTRUMENTS) IMPLANT
DRIVER NDLE LRG 8 DVNC XI (INSTRUMENTS) IMPLANT
DRIVER NDLE MEGA SUTCUT DVNCXI (INSTRUMENTS) IMPLANT
DRSG TEGADERM 2-3/8X2-3/4 SM (GAUZE/BANDAGES/DRESSINGS) ×5 IMPLANT
DRSG TEGADERM 4X4.75 (GAUZE/BANDAGES/DRESSINGS) IMPLANT
ELECT REM PT RETURN 15FT ADLT (MISCELLANEOUS) ×1 IMPLANT
EVACUATOR DRAINAGE 10X20 100CC (DRAIN) IMPLANT
EVACUATOR SILICONE 100CC (DRAIN) IMPLANT
FORCEPS BPLR FENES DVNC XI (FORCEP) IMPLANT
FORCEPS PROGRASP DVNC XI (FORCEP) IMPLANT
GAUZE SPONGE 2X2 8PLY STRL LF (GAUZE/BANDAGES/DRESSINGS) ×1 IMPLANT
GLOVE ECLIPSE 8.0 STRL XLNG CF (GLOVE) ×2 IMPLANT
GLOVE INDICATOR 8.0 STRL GRN (GLOVE) ×2 IMPLANT
GOWN STRL REUS W/ TWL XL LVL3 (GOWN DISPOSABLE) ×2 IMPLANT
GRASPER SUT TROCAR 14GX15 (MISCELLANEOUS) IMPLANT
GRASPER TIP-UP FEN DVNC XI (INSTRUMENTS) IMPLANT
IRRIGATION SUCT STRKRFLW 2 WTP (MISCELLANEOUS) IMPLANT
KIT BASIN OR (CUSTOM PROCEDURE TRAY) ×1 IMPLANT
KIT TURNOVER KIT A (KITS) IMPLANT
MARKER SKIN DUAL TIP RULER LAB (MISCELLANEOUS) ×1 IMPLANT
MESH VENTRALIGHT ST 12X14IN (Mesh General) IMPLANT
NDL HYPO 22X1.5 SAFETY MO (MISCELLANEOUS) ×1 IMPLANT
NEEDLE HYPO 22X1.5 SAFETY MO (MISCELLANEOUS) ×1 IMPLANT
PACK CARDIOVASCULAR III (CUSTOM PROCEDURE TRAY) ×1 IMPLANT
PACK GENERAL/GYN (CUSTOM PROCEDURE TRAY) ×1 IMPLANT
PAD POSITIONING PINK XL (MISCELLANEOUS) ×1 IMPLANT
PENCIL SMOKE EVACUATOR (MISCELLANEOUS) IMPLANT
SCISSORS LAP 5X45 EPIX DISP (ENDOMECHANICALS) IMPLANT
SCISSORS MNPLR CVD DVNC XI (INSTRUMENTS) IMPLANT
SEAL UNIV 5-12 XI (MISCELLANEOUS) ×4 IMPLANT
SEALER VESSEL EXT DVNC XI (MISCELLANEOUS) ×1 IMPLANT
SOLUTION ELECTROSURG ANTI STCK (MISCELLANEOUS) ×1 IMPLANT
SPIKE FLUID TRANSFER (MISCELLANEOUS) ×1 IMPLANT
STRIP CLOSURE SKIN 1/2X4 (GAUZE/BANDAGES/DRESSINGS) IMPLANT
SUT ETHIBOND 0 36 GRN (SUTURE) IMPLANT
SUT ETHIBOND NAB CT1 #1 30IN (SUTURE) IMPLANT
SUT MNCRL AB 4-0 PS2 18 (SUTURE) ×1 IMPLANT
SUT NOVA NAB DX-16 0-1 5-0 T12 (SUTURE) IMPLANT
SUT PROLENE 1 CT 1 30 (SUTURE) IMPLANT
SUT PROLENE 2 0 SH DA (SUTURE) IMPLANT
SUT VIC AB 2-0 SH 18 (SUTURE) IMPLANT
SUT VICRYL 0 UR6 27IN ABS (SUTURE) IMPLANT
SUTURE STRAFIX SYMMETRC 1-0 12 (SUTURE) IMPLANT
SUTURE VLOC BRB 180 ABS3/0GR12 (SUTURE) IMPLANT
SYR 10ML LL (SYRINGE) ×1 IMPLANT
SYR 20ML LL LF (SYRINGE) ×1 IMPLANT
SYSTEM UROSTOMY GENTLE TOUCH (WOUND CARE) IMPLANT
TOWEL OR 17X26 10 PK STRL BLUE (TOWEL DISPOSABLE) ×1 IMPLANT
TRAY FOLEY MTR SLVR 14FR STAT (SET/KITS/TRAYS/PACK) IMPLANT
TRAY FOLEY MTR SLVR 16FR STAT (SET/KITS/TRAYS/PACK) IMPLANT
TROCAR ADV FIXATION 5X100MM (TROCAR) IMPLANT
TUBING INSUFFLATION 10FT LAP (TUBING) ×1 IMPLANT

## 2024-04-10 NOTE — Anesthesia Postprocedure Evaluation (Signed)
 Anesthesia Post Note  Patient: Kristopher Thompson  Procedure(s) Performed: REPAIR, HERNIA, PARASTOMAL AND VENTRAL HERNIAS, ROBOT-ASSISTED, LEFT INGUINAL HERNIA, LYSIS OF ADHESIONS INCARCERATED AND INSIONAL HERNIAS AND UROSTOMY REVISION REPAIR, HERNIA, VENTRAL     Patient location during evaluation: PACU Anesthesia Type: General Level of consciousness: sedated, patient cooperative and oriented Pain management: pain level controlled Vital Signs Assessment: post-procedure vital signs reviewed and stable Respiratory status: spontaneous breathing, nonlabored ventilation, respiratory function stable and patient connected to nasal cannula oxygen Cardiovascular status: blood pressure returned to baseline and stable Postop Assessment: no apparent nausea or vomiting Anesthetic complications: no   No notable events documented.  Last Vitals:  Vitals:   04/10/24 1745 04/10/24 1800  BP: 126/77 118/80  Pulse: 93 94  Resp: 20 19  Temp:    SpO2: 97% 95%    Last Pain:  Vitals:   04/10/24 1745  TempSrc:   PainSc: Asleep                 Thadius Smisek,E. Meredyth Hornung

## 2024-04-10 NOTE — H&P (Signed)
 04/10/2024     REFERRING PHYSICIAN: Self  Patient Care Team: Kristopher Pons, MD as PCP - General (Internal Medicine) Kristopher Finn, MD as Consulting Provider (General Surgery) Kristopher Thompson., MD (Urology) Kristopher Thompson, Kristopher Kinds, MD as Consulting Provider (General Surgery) Kristopher Thompson (Pulmonary Disease)  PROVIDER: Girtha Lama, MD  [name]: [id number] DOB: [birth year] DATE OF ENCOUNTER: 03/02/2024  Subjective   Chief Complaint: Follow-up (Parastomal hernia of ileal conduit)   History of Present Illness: Kristopher Thompson is a 73 y.o. male who is seen today as an office consultation at the request of Dr. Nathalie Thompson for evaluation of Follow-up (Parastomal hernia of ileal conduit) .   Patient returns for follow-up. It has been 2-1/2 years. Has midline incisional and very large parastomal hernia around his ileal conduit from her prior cystectomy also had a diverting colostomy that was taken down. Patient notes that he feels like the hernia is gotten larger. He is getting more constipated need to take more like MiraLAX  to improve his bowels. Does not like wearing a binder as it causes discomfort. He is moderately active. He is to walk several miles a day but with his knee he goes maybe a mile a day. He did get a pulmonary embolism and is now anticoagulated on Eliquis. Followed by pulmonologist up in [employer] [facility] [place] . Still followed by Dr. Rozanne Thompson with [facility] with no evidence of any cancer recurrence since the initial resection 2018. He is able to keep a urostomy appliance on his conduit in his right lower quadrant.  PRIOR NOTE 07/24/2021 Pleasant obese male. He required cystectomy with urostomy ileal conduit as well as rectal. Diverting colostomy 2018 for a bladder cancer. Had colostomy takedown last year. No evidence of cancer recurrence. He has had hernias for some time. However they have gotten larger. Noticed on a screening colonoscopy by Dr.  Andy Thompson. Patient complaining of intermittent pain. Surgical consultation offered. Patient comes by himself. He notes that he will get intermittent episodes of sharp abdominal pain. Not quite nausea. He is taken some stool softener. Has not had an episode in a month. He can walk his 2 miles a day usually. He does not smoke since he quit around the diagnosis of his bladder cancer. He is obese. Weight is stable. No cardiac or pulmonary issues. Not on any blood thinners.. No diabetes. No sleep apnea. He had an appendectomy in 1972. Cholecystectomy in 83. Usually moves his bowels once or twice a day. Able to keep his urostomy appliance on for at least 2 or 3 days. Changes out of habit. No major leaking or ulcerations. Most discomfort around the ostomy on the right side.  Medical History: Past Medical History:  Diagnosis Date  Chronic kidney disease  GERD (gastroesophageal reflux disease)  History of cancer   Patient Active Problem List  Diagnosis  Parastomal hernia of ileal conduit  Incisional hernia, without obstruction or gangrene  Obesity (BMI 35.0-39.9 without comorbidity), unspecified  History of bladder cancer  Status post ileal conduit (CMS/HHS-HCC)  Hearing loss  Generalized abdominal pain  Partial small bowel obstruction (CMS/HHS-HCC)  Personal history of PE (pulmonary embolism)  Chronic anticoagulation   Past Surgical History:  Procedure Laterality Date  APPENDECTOMY  bladder cancer N/A  prostate cancer N/A    Allergies  Allergen Reactions  Meperidine  Nausea And Vomiting and Other (See Comments)  SEVERE NAUSEA   Current Outpatient Medications on File Prior to Visit  Medication Sig Dispense Refill  metFORMIN (GLUCOPHAGE) 500 MG tablet  rosuvastatin (CRESTOR) 5 MG tablet Take 10 mg by mouth once daily  sennosides (SENOKOT) 8.6 mg tablet Take 1 tablet by mouth once daily   No current facility-administered medications on file prior to visit.   Family History  Problem  Relation Age of Onset  Skin cancer Mother  Obesity Father  High blood pressure (Hypertension) Father  Hyperlipidemia (Elevated cholesterol) Father  Colon cancer Sister    Social History   Tobacco Use  Smoking Status Former  Smokeless Tobacco Never    Social History   Socioeconomic History  Marital status: Married  Tobacco Use  Smoking status: Former  Smokeless tobacco: Never  Advertising account planner  Vaping status: Never Used  Substance and Sexual Activity  Alcohol  use: Yes  Drug use: Not Currently   ############################################################  Fraility Risk:  Preoperative Risks/Screening 1. Frailty Review:  Lives independently? yes Uses a mobility assist device (cane/walker/wheelchair)? no History of falls within 3 months? no Cognitive impairment/dementia? no Age > 65? yes  2. Nutrition Screening: Cancer/IBD? yes 2018- NED 2022 Age >65? yes Weight loss >10% in past 6 months? no If yes to any of the 3 above, consider Impact AR supplemental shake  3. PONV Screening: History of PONV? no Male under age of 68? no History of motion sickness? no  4. Chronic pain issues? no  5. Diabetes? no Last HgbA1c: No results found for: "HGBA1C"  Review of Systems: A complete review of systems (ROS) was obtained from the patient. I have reviewed this information and discussed as appropriate with the patient. See HPI as well for other pertinent ROS.  Constitutional: No fevers, chills, sweats. Weight stable Eyes: No vision changes, No discharge HENT: No sore throats, nasal drainage Lymph: No neck swelling, No bruising easily Pulmonary: No cough, productive sputum CV: No orthopnea, PND Patient walks 60 minutes for about 2 miles without difficulty. No exertional chest/neck/shoulder/arm pain.  GI: No personal nor family history of GI/colon cancer, inflammatory bowel disease, irritable bowel syndrome, allergy such as Celiac Sprue, dietary/dairy problems, colitis, ulcers  nor gastritis. No recent sick contacts/gastroenteritis. No travel outside the country. No changes in diet.  Renal: No UTIs, No hematuria. Bladder cancer status post cystectomy and ileal conduit 2018. Genital: No drainage, bleeding, masses Musculoskeletal: No severe joint pain. Good ROM major joints Skin: No sores or lesions Heme/Lymph: No easy bleeding. No swollen lymph nodes  Objective:   Vitals:  03/02/24 1143  Weight: (!) 111.7 kg (246 lb 3.2 oz)  PainSc: 0-No pain    Body mass index is 34.34 kg/m.  PHYSICAL EXAM:  Constitutional: Not cachectic. Hygeine adequate. Vitals signs as above.  Eyes: Pupils reactive, normal extraocular movements. Sclera nonicteric Neuro: CN II-XII intact. No major focal sensory defects. No major motor deficits. Lymph: No head/neck/groin lymphadenopathy Psych: No severe agitation. No severe anxiety. Judgment & insight Adequate, Oriented x4, HENT: Normocephalic, Mucus membranes moist. No thrush.  Neck: Supple, No tracheal deviation. No obvious thyromegaly Chest: No pain to chest wall compression. Good respiratory excursion. No audible wheezing CV: Pulses intact. Regular rhythm. No major extremity edema  Abdomen: Obese with panniculus right. No evidence of any hernia around left sided old colostomy site nor any major panniculus there. Soft. Nondistended. Right lower quadrant panniculus with cantaloupe sized mass hernia around his small bowel ostomy containing urine consistent with a large parastomal hernia around his ileal conduit. Some periumbilical and infraumbilical swelling along the midline incision consistent with the periumbilical and infraumbilical midline hernias as well. Hernia is not reducible. Aaron Aas  No hepatomegaly. No splenomegaly. Diastasis recti: Mild supraumbilical midline.   Gen: Inguinal hernia: Not present. Inguinal lymph nodes: without lymphadenopathy.   Rectal: (Deferred)  Ext: No obvious deformity or contracture. Edema: Not present. No  cyanosis Skin: No major subcutaneous nodules. Warm and dry Musculoskeletal: Severe joint rigidity not present. No obvious clubbing. No digital petechiae.   Labs, Imaging and Diagnostic Testing:  Located in 'Care Everywhere' section of Epic EMR chart  PRIOR NOTES   Located in 'Care Everywhere' section of Epic EMR chart  SURGERY NOTES:  Located in 'Care Everywhere' section of Epic EMR chart  PATHOLOGY:  Located in 'Care Everywhere' section of Epic EMR chart  Assessment and Plan:  DIAGNOSES:  Diagnoses and all orders for this visit:  Parastomal hernia of ileal conduit  History of bladder cancer  Status post ileal conduit (CMS/HHS-HCC)  Incisional hernia, without obstruction or gangrene  Generalized abdominal pain  Partial small bowel obstruction (CMS/HHS-HCC)  Personal history of PE (pulmonary embolism)  Chronic anticoagulation    ASSESSMENT/PLAN  Pleasant bladder cancer survivor status post cystectomy with ureteral ileal conduit 2018. No evidence of any disease recurrent by CAT scan 2025.  Numerous hernias around old midline incision, left lower quadrant colostomy site, and especially around his right lower quadrant ileal urostomy conduit. Parastomal and periumbilical containing small intestine. Suprapubic contains colon. Looks to be getting slightly larger year by year.  Given the fact that it has bowel containing it and is caused intermittent sharp pains, I am concerned about leaving these alone. While he is of advanced age and obese, he has good activity level and good quality of life. That means more time for these to get even worse. As I recommended 2 and half years ago, proceed with surgery. He is more symptomatic and more constipated. He now wishes to reconsider surgery. He is obese but he is getting more symptomatic. He has lost some weight. He has gone down from 263 pounds to 246 pounds in 2 years. He has remained abstinent on smoking.   Big operation. Most  like a combination of a minimally invasive robotic approach to lyse all adhesions, mapped the region out. See if I can do this all robotic TEP vs modified TAPP. Most likely require absorbable Phasix mesh to help Sugarbaker the urostomy laterally and then primarily repair and reinforce with larger polypropylene Sugarbaker type mesh for of all 3 areas. See if I can get to his right posterior flank. See if I can get some overlap in the suprapubic region to get mesh down. That is the site of his prior cystectomy so it may be a challenge to get that down but his suprapubic hernias are very low. The good overlap. Hopefully we will just need 1 large sheet of mesh from the midline and right lateral. Large sheet(s) I may have to consider robotic TAR on that side as well. We will see. Try not to relocate the urostomy and just place a Blake drain in the hernia sac to get it to shrink down and control it better.   The anatomy & physiology of the abdominal wall was discussed. The pathophysiology of hernias was discussed. Natural history risks without surgery including progeressive enlargement, pain, incarceration, & strangulation was discussed. Contributors to complications such as smoking, obesity, diabetes, prior surgery, etc were discussed.   I feel the risks of no intervention will lead to serious problems that outweigh the operative risks; therefore, I recommended surgery to reduce and repair the hernia. I explained laparoscopic  techniques with possible need for an open approach. I noted the probable use of mesh to patch and/or buttress the hernia repair  Risks such as bleeding, infection, abscess, need for further treatment, injury to other organs, need for repair of tissues / organs, stroke, heart attack, death, and other risks were discussed. I noted a good likelihood this will help address the problem. Goals of post-operative recovery were discussed as well. Possibility that this will not correct all symptoms was  explained. I stressed the importance of low-impact activity, aggressive pain control, avoiding constipation, & not pushing through pain to minimize risk of post-operative chronic pain or injury. Possibility of reherniation especially with smoking, obesity, diabetes, immunosuppression, and other health conditions was discussed. We will work to minimize complications.   An educational handout further explaining the pathology & treatment options was given as well. Questions were answered. The patient expresses understanding & wishes to proceed with surgery.    Eddye Goodie, MD, FACS, MASCRS Esophageal, Gastrointestinal & Colorectal Surgery Robotic and Minimally Invasive Surgery  [facility] [address] [place], [place] [address] [phone] Fax [phone] Main  CONTACT INFORMATION: Weekday (9AM-5PM): Call [facility] main office at [phone] Weeknight (5PM-9AM) or Weekend/Holiday: Check EPIC "Web Links" tab & use "AMION" (password " [email or web address]") for General Surgery [facility] coverage  Please, DO NOT use SecureChat  (it is not reliable communication to reach operating surgeons & will lead to a delay in care).   Epic staff messaging available for outptient concerns needing 1-2 business day response.     04/10/2024

## 2024-04-10 NOTE — Discharge Instructions (Addendum)
 #######################################################  Ostomy Support Information  You've heard that people get along just fine with only one of their eyes, or one of their lungs, or one of their kidneys. But you also know that you have only one intestine and only one bladder, and that leaves you feeling awfully empty, both physically and emotionally: You think no other people go around without part of their intestine with the ends of their intestines sticking out through their abdominal walls.   YOU ARE NOT ALONE.  There are nearly three quarters of a million people in the [place]  who have an ostomy; people who have had surgery to remove all or part of their colons or bladders.   There is even a national association, the [place] [employer] [place] with over 350 local affiliated support groups that are organized by volunteers who provide peer support and counseling. [facility] has a toll free telephone num-ber, 919-088-3492 and an educational, interactive website, www.ostomy.org   An ostomy is an opening in the belly (abdominal wall) made by surgery. Ostomates are people who have had this procedure. The opening (stoma) allows the kidney or bowel to grdischarge waste. An external pouch covers the stoma to collect waste. Pouches are are a simple bag and are odor free. Different companies have disposable or reusable pouches to fit one's lifestyle. An ostomy can either be temporary or permanent.   THERE ARE THREE MAIN TYPES OF OSTOMIES Colostomy. A colostomy is a surgically created opening in the large intestine (colon). Ileostomy. An ileostomy is a surgically created opening in the small intestine. Urostomy. A urostomy is a surgically created opening to divert urine away from the bladder.  OSTOMY Care  The following guidelines will make care of your colostomy easier. Keep this information close by for quick reference.  Helpful DIET hints Eat a well-balanced diet including vegetables and fresh  fruits. Eat on a regular schedule.  Drink at least 6 to 8 glasses of fluids daily. Eat slowly in a relaxed atmosphere. Chew your food thoroughly. Avoid chewing gum, smoking, and drinking from a straw. This will help decrease the amount of air you swallow, which may help reduce gas. Eating yogurt or drinking buttermilk may help reduce gas.  To control gas at night, do not eat after 8 p.m. This will give your bowel time to quiet down before you go to bed.  If gas is a problem, you can purchase Beano. Sprinkle Beano on the first bite of food before eating to reduce gas. It has no flavor and should not change the taste of your food. You can buy Beano over the counter at your local drugstore.  Foods like fish, onions, garlic, broccoli, asparagus, and cabbage produce odor. Although your pouch is odor-proof, if you eat these foods you may notice a stronger odor when emptying your pouch. If this is a concern, you may want to limit these foods in your diet.  If you have an ileostomy, you will have chronic diarrhea & need to drink more liquids to avoid getting dehydrated.  Consider antidiarrheal medicine like imodium  (loperamide ) or Lomotil to help slow down bowel movements / diarrhea into your ileostomy bag.  GETTING TO GOOD BOWEL HEALTH WITH AN ILEOSTOMY    With the colon bypassed & not in use, you will have small bowel diarrhea.   It is important to thicken & slow your bowel movements down.   The goal: 4-6 small BOWEL MOVEMENTS A DAY It is important to drink plenty of liquids to avoid  getting dehydrated  CONTROLLING ILEOSTOMY DIARRHEA  TAKE A FIBER SUPPLEMENT (FiberCon or Benefiner soluble fiber) twice a day - to thicken stools by absorbing excess fluid and retrain the intestines to act more normally.  Slowly increase the dose over a few weeks.  Too much fiber too soon can backfire and cause cramping & bloating.  TAKE AN IRON  SUPPLEMENT twice a day to naturally constipate your bowels.  Usually  ferrous sulfate 325mg  twice a day)  TAKE ANTI-DIARRHEAL MEDICINES: Loperamide  (Imodium ) can slow down diarrhea.  Start with two tablets (= 4mg ) first and then try one tablet every 6 hours.  Can go up to 2 pills four times day (8 pills of 2mg  max) Avoid if you are having fevers or severe pain.  If you are not better or start feeling worse, stop all medicines and call your doctor for advice LoMotil (Diphenoxylate / Atropine) is another medicine that can constipate & slow down bowel moevements Pepto Bismol (bismuth) can gently thicken bowels as well  If diarrhea is worse,: drink plenty of liquids and try simpler foods for a few days to avoid stressing your intestines further. Avoid dairy products (especially milk & ice cream) for a short time.  The intestines often can lose the ability to digest lactose when stressed. Avoid foods that cause gassiness or bloating.  Typical foods include beans and other legumes, cabbage, broccoli, and dairy foods.  Every person has some sensitivity to other foods, so listen to our body and avoid those foods that trigger problems for you.Call your doctor if you are getting worse or not better.  Sometimes further testing (cultures, endoscopy, X-ray studies, bloodwork, etc) may be needed to help diagnose and treat the cause of the diarrhea. Take extra anti-diarrheal medicines (maximum is 8 pills of 2mg  loperamide  a day)   Tips for POUCHING an OSTOMY   Changing Your Pouch The best time to change your pouch is in the morning, before eating or drinking anything. Your stoma can function at any time, but it will function more after eating or drinking.   Applying the pouching system  Place all your equipment close at hand before removing your pouch.  Wash your hands.  Stand or sit in front of a mirror. Use the position that works best for you. Remember that you must keep the skin around the stoma wrinkle-free for a good seal.  Gently remove the used pouch (1-piece  system) or the pouch and old wafer (2-piece system). Empty the pouch into the toilet. Save the closure clip to use again.  Wash the stoma itself and the skin around the stoma. Your stoma may bleed a little when being washed. This is normal. Rinse and pat dry. You may use a wash cloth or soft paper towels (like Bounty), mild soap (like Dial, Safeguard, or [place]), and water . Avoid soaps that contain perfumes or lotions.  For a new pouch (1-piece system) or a new wafer (2-piece system), measure your stoma using the stoma guide in each box of supplies.  Trace the shape of your stoma onto the back of the new pouch or the back of the new wafer. Cut out the opening. Remove the paper backing and set it aside.  Optional: Apply a skin barrier powder to surrounding skin if it is irritated (bare or weeping), and dust off the excess. Optional: Apply a skin-prep wipe (such as Skin Prep or All-Kare) to the skin around the stoma, and let it dry. Do not apply this solution if the  skin is irritated (red, tender, or broken) or if you have shaved around the stoma. Optional: Apply a skin barrier paste (such as Stomahesive, Coloplast, or Premium) around the opening cut in the back of the pouch or wafer. Allow it to dry for 30 to 60 seconds.  Hold the pouch (1-piece system) or wafer (2-piece system) with the sticky side toward your body. Make sure the skin around the stoma is wrinkle-free. Center the opening on the stoma, then press firmly to your abdomen (Fig. 4). Look in the mirror to check if you are placing the pouch, or wafer, in the right position. For a 2-piece system, snap the pouch onto the wafer. Make sure it snaps into place securely.  Place your hand over the stoma and the pouch or wafer for about 30 seconds. The heat from your hand can help the pouch or wafer stick to your skin.  Add deodorant (such as Super Banish or Nullo) to your pouch. Other options include food extracts such as vanilla oil and peppermint  extract. Add about 10 drops of the deodorant to the pouch. Then apply the closure clamp. Note: Do not use toxic  chemicals or commercial cleaning agents in your pouch. These substances may harm the stoma.  Optional: For extra seal, apply tape to all 4 sides around the pouch or wafer, as if you were framing a picture. You may use any brand of medical adhesive tape. Change your pouch every 5 to 7 days. Change it immediately if a leak occurs.  Wash your hands afterwards.  If you are wearing a 2-piece system, you may use 2 new pouches per week and alternate them. Rinse the pouch with mild soap and warm water  and hang it to dry for the next day. Apply the fresh pouch. Alternate the 2 pouches like this for a week. After a week, change the wafer and begin with 2 new pouches. Place the old pouches in a plastic bag, and put them in the trash.   LIVING WITH AN OSTOMY  Emptying Your Pouch Empty your pouch when it is one-third full (of urine, stool, and/or gas). If you wait until your pouch is fuller than this, it will be more difficult to empty and more noticeable. When you empty your pouch, either put toilet paper in the toilet bowl first, or flush the toilet while you empty the pouch. This will reduce splashing. You can empty the pouch between your legs or to one side while sitting, or while standing or stooping. If you have a 2-piece system, you can snap off the pouch to empty it. Remember that your stoma may function during this time. If you wish to rinse your pouch after you empty it, a turkey baster can be helpful. When using a baster, squirt water  up into the pouch through the opening at the bottom. With a 2-piece system, you can snap off the pouch to rinse it. After rinsing  your pouch, empty it into the toilet. When rinsing your pouch at home, put a few granules of Dreft soap in the rinse water . This helps lubricate and freshen your pouch. The inside of your pouch can be sprayed with non-stick cooking  oil (Pam spray). This may help reduce stool sticking to the inside of the pouch.  Bathing You may shower or bathe with your pouch on or off. Remember that your stoma may function during this time.  The materials you use to wash your stoma and the skin around it should be  clean, but they do not need to be sterile.  Wearing Your Pouch During hot weather, or if you perspire a lot in general, wear a cover over your pouch. This may prevent a rash on your skin under the pouch. Pouch covers are sold at ostomy supply stores. Wear the pouch inside your underwear for better support. Watch your weight. Any gain or loss of 10 to 15 pounds or more can change the way your pouch fits.  Going Away From Home A collapsible cup (like those that come in travel kits) or a soft plastic squirt bottle with a pull-up top (like a travel bottle for shampoo) can be used for rinsing your pouch when you are away from home. Tilt the opening of the pouch at an upward angle when using a cup to rinse.  Carry wet wipes or extra tissues to use in public bathrooms.  Carry an extra pouching system with you at all times.  Never keep ostomy supplies in the glove compartment of your car. Extreme heat or cold can damage the skin barriers and adhesive wafers on the pouch.  When you travel, carry your ostomy supplies with you at all times. Keep them within easy reach. Do not pack ostomy supplies in baggage that will be checked or otherwise separated from you, because your baggage might be lost. If you're traveling out of the country, it is helpful to have a letter stating that you are carrying ostomy supplies as a medical necessity.  If you need ostomy supplies while traveling, look in the yellow pages of the telephone book under "Surgical Supplies." Or call the local ostomy organization to find out where supplies are available.  Do not let your ostomy supplies get low. Always order new pouches before you use the last one.  Reducing  Odor Limit foods such as broccoli, cabbage, onions, fish, and garlic in your diet to help reduce odor. Each time you empty your pouch, carefully clean the opening of the pouch, both inside and outside, with toilet paper. Rinse your pouch 1 or 2 times daily after you empty it (see directions for emptying your pouch and going away from home). Add deodorant (such as Super Banish or Nullo) to your pouch. Use air deodorizers in your bathroom. Do not add aspirin to your pouch. Even though aspirin can help prevent odor, it could cause ulcers on your stoma.  When to call the doctor Call the doctor if you have any of the following symptoms: Purple, black, or white stoma Severe cramps lasting more than 6 hours Severe watery discharge from the stoma lasting more than 6 hours No output from the colostomy for 3 days Excessive bleeding from your stoma Swelling of your stoma to more than 1/2-inch larger than usual Pulling inward of your stoma below skin level Severe skin irritation or deep ulcers Bulging or other changes in your abdomen  When to call your ostomy nurse Call your ostomy/enterostomal therapy (WOCN) nurse if any of the following occurs: Frequent leaking of your pouching system Change in size or appearance of your stoma, causing discomfort or problems with your pouch Skin rash or rawness Weight gain or loss that causes problems with your pouch     FREQUENTLY ASKED QUESTIONS   Why haven't you met any of these folks who have an ostomy?  Well, maybe you have! You just did not recognize them because an ostomy doesn't show. It can be kept secret if you wish. Why, maybe some of your best friends, office  associates or neighbors have an ostomy ... you never can tell. People facing ostomy surgery have many quality-of-life questions like: Will you bulge? Smell? Make noises? Will you feel waste leaving your body? Will you be a captive of the toilet? Will you starve? Be a social outcast? Get/stay  married? Have babies? Easily bathe, go swimming, bend over?  OK, let's look at what you can expect:   Will you bulge?  Remember, without part of the intestine or bladder, and its contents, you should have a flatter tummy than before. You can expect to wear, with little exception, what you wore before surgery ... and this in-cludes tight clothing and bathing suits.   Will you smell?  Today, thanks to modern odor proof pouching systems, you can walk into an ostomy support group meeting and not smell anything that is foul or offensive. And, for those with an ileostomy or colostomy who are concerned about odor when emptying their pouch, there are in-pouch deodorants that can be used to eliminate any waste odors that may exist.   Will you make noises?  Everyone produces gas, especially if they are an air-swallower. But intestinal sounds that occur from time to time are no differ-ent than a gurgling tummy, and quite often your clothing will muffle any sounds.   Will you feel the waste discharges?  For those with a colostomy or ileostomy there might be a slight pressure when waste leaves your body, but understand that the intestines have no nerve endings, so there will be no unpleasant sensations. Those with a urostomy will probably be unaware of any kidney drainage.   Will you be a captive of the toilet?  Immediately post-op you will spend more time in the bathroom than you will after your body recovers from surgery. Every person is different, but on average those with an ileostomy or urostomy may empty their pouches 4 to 6 times a day; a little  less if you have a colostomy. The average wear time between pouch system changes is 3 to 5 days and the changing process should take less than 30 minutes.   Will I need to be on a special diet? Most people return to their normal diet when they have recovered from surgery. Be sure to chew your food well, eat a well-balanced diet and drink plenty of fluids. If  you experience problems with a certain food, wait a couple of weeks and try it again.  Will there be odor and noises? Pouching systems are designed to be odor-proof or odor-resistant. There are deodorants that can be used in the pouch. Medications are also available to help reduce odor. Limit gas-producing foods and carbonated beverages. You will experience less gas and fewer noises as you heal from surgery.  How much time will it take to care for my ostomy? At first, you may spend a lot of time learning about your ostomy and how to take care of it. As you become more comfortable and skilled at changing the pouching system, it will take very little time to care for it.   Will I be able to return to work? People with ostomies can perform most jobs. As soon as you have healed from surgery, you should be able to return to work. Heavy lifting (more than 10 pounds) may be discouraged.   What about intimacy? Sexual relationships and intimacy are important and fulfilling aspects of your life. They should continue after ostomy surgery. Intimacy-related concerns should be discussed openly between you and  your partner.   Can I wear regular clothing? You do not need to wear special clothing. Ostomy pouches are fairly flat and barely noticeable. Elastic undergarments will not hurt the stoma or prevent the ostomy from functioning.   Can I participate in sports? An ostomy should not limit your involvement in sports. Many people with ostomies are runners, skiers, swimmers or participate in other active lifestyles. Talk with your caregiver first before doing heavy physical activity.  Will you starve?  Not if you follow doctor's orders at each stage of your post-op adjustment. There is no such thing as an "ostomy diet". Some people with an ostomy will be able to eat and tolerate anything; others may find diffi-culty with some foods. Each person is an individual and must determine, by trial, what is best for  them. A good practice for all is to drink plenty of water .   Will you be a social outcast?  Have you met anyone who has an ostomy and is a social outcast? Why should you be the first? Only your attitude and self image will effect how you are treated. No confi-dent person is an Investment banker, corporate.    PROFESSIONAL HELP   Resources are available if you need help or have questions about your ostomy.   Specially trained nurses called Wound, Ostomy Continence Nurses (WOCN) are available for consultation in most major medical centers.  Consider getting an ostomy consult at an outpatient ostomy clinic.    has an Ostomy Clinic run by an Chartered certified accountant at the [facility] campus.  [phone]. [facility] can help set up an appointment   The [employer] ([employer]OA) is a group made up of many local chapters throughout the [place] . These local groups hold meetings and provide support to prospective and existing ostomates. They sponsor educational events and have qualified visitors to make personal or telephone visits. Contact the [employer]OA for the chapter nearest you and for other educational publications.  More detailed information can be found in Colostomy Guide, a publication of the [employer] (UOA). Contact UOA at 1-2544110628 or visit their web site at YellowSpecialist.at. The website contains links to other sites, suppliers and resources.  Media planner Start Services: Start at the website to enlist for support.  Your Wound Ostomy (WOCN) nurse may have started this process. https://[email or web address].[email or web address]/securestart Secure Start services are designed to support people as they live their lives with an ostomy or neurogenic bladder. Enrolling is easy and at no cost to the patient. We realize that each person's needs and life journey are different. Through Secure Start services, we want to help people live their life, their  way.  #######################################################     DRAIN CARE:   You have a closed bulb drain to help you heal.    Bulb Closed Drain Care  The bulb drainage system has flexible tubing attached to a soft, plastic egg shaped clear plastic bulb with a stopper. The drainage end of the tubing goes into a cavity your body (surgery area, abscess, etc) through a small opening in your skin. A stitch & tape holds the drainage end in place. The rest of the tube is outside your body, attached to a bulb. When the bulb is squeezed down & compressed with the stopper in place, it creates a constant gentle suction.  This helps draw out fluid that collects in the bulb to help the inner cavity to shrink down & heal. The bulb should be compressed at all  times, except when you are emptying the drainage.  How long you will have your drain depends on your recovery & the amount & type of fluid is draining. This is different for everyone. The drain & tubing is usually removed when the drainage is 30 mL or less a day. to your follow-up appointments.  Caring for Your DRAIN In order to care for your Jackson-Pratt at home, you or your caregiver will do the following:  Empty the drain at least once a day and record the color and amount of drainage  Care for the area where the tubing enters your skin by washing with soap and water . Place the bulb in a pocket or safety-pin to your clothes to keep it from pulling at the skin  Milk or flush the tubing to keep the tubing from getting plugged up / blocked.   Do this before you empty and measure your drainage. Look in the mirror at the tubing. This will help you see where your hands need to be. Pinch the tubing close to where it goes into your skin between your thumb and forefinger. With the thumb and forefinger of your other hand, pinch the tubing right below your other fingers. Keep your fingers pinched and slide them down the tubing, pushing any clots down  toward the bulb. You may want to use alcohol  swabs to help you slide your fingers down the tubing. Repeat steps 3 and 4 as necessary to push clots from the tubing into the bulb. If you are not able to move a clot into the bulb, call your doctor's office. The fluid may leak around the insertion site if a clot is blocking the drainage flow. If there is fluid in the bulb and no leakage at the insertion site, the drain is working.   Caring for the Insertion Site  Once you have emptied the drainage, clean your hands again.  Check the area around the insertion site:  Redness, swelling, crusting, or yellow scab can form around a drain over time. Usually this is smaller than a dime & stays small  If you have worsening tenderness, swelling, or pus or a fever >101.37F; you may have an infection. Call your doctor's office.  Wash drain site with soap & water  (dilute hydrogen peroxide  PRN) daily & replace clean dressing / tape    DAILY DRAIN CARE Keep the bulb compressed at all times, except while emptying it. The compression creates suction to gently drain & closed the cavity .  Keep sites where the tubes enter the skin dry and covered with a light bandage (dressing).  Tape the tubes to your skin, 1 to 2 inches away from the insertion site, to keep from pulling on your stitches. Tubes are stitched in place and will not slip out.  Pin the bulb to your shirt (not to your pants) with a safety pin.  For the first few days after surgery, there usually is more fluid in the bulb. Empty the bulb whenever it becomes half full because the bulb does not create enough suction if it is too full. Include this amount in your 24 hour totals.  When the amount of drainage decreases, empty the bulb at the same time every day. Write down the amounts and the 24 hour totals. Your caregiver will want to know them. This helps your caregiver know when the tubes can be removed.  (We anticipate removing the drain in 1-3 weeks,  depending on when the output is <94mL a day  for 2+ days) If there is drainage around the tube sites, change dressings and keep the area dry. If you see a clot in the tube, leave it alone. However, if the tube does not appear to be draining, let your caregiver know.   TO EMPTY THE BULB Wash your hands before & after Open the stopper to release suction.  Holding the stopper out of the way, pour drainage into the measuring cup that was sent home with you.  Measure and write down the amount. If there are 2 bulbs, note the amount of drainage from bulb 1 or bulb 2 and keep the totals separate.  Compress the bulb by folding it in half. & replace the stopper.  Check the tape that holds the tube to your skin, and pin the bulb to your shirt.    SEEK MEDICAL CARE IF: The drainage develops a bad odor.  You have an oral temperature above 101.5 F (38.5 C).  The amount of drainage from your wound suddenly increases or decreases.  You accidentally pull out your drain.  You have any other questions or concerns.      Call our office if you have any questions about your drain. [phone]

## 2024-04-10 NOTE — Op Note (Addendum)
 04/10/2024  PATIENT:  Kristopher Thompson  73 y.o. 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  PROCEDURE:   -ROBOTIC LYSIS OF ADHESIONS X 4 HOURS -COMPONENT SEPARATION - TRANSVERSUS ABDOMINIS REALEASE (TAR) BILATERAL -ROBOTIC REPAIRS OF  INCISIONAL, PARASTOMAL, LEFT INGUINAL  INCARCERATED ABDOMINAL WALL HERNIAS WITH MESH -UROSTOMY ILEAL CONDUIT REVISION -INTRAOPERATIVE ASSESSMENT OF TISSUE VASCULAR PERFUSION USING ICG (indocyanine green ) -IMMUNOFLUORESCENCE -TRANSVERSUS ABDOMINIS PLANE (TAP) BLOCK - BILATERAL   SURGEON:  Eddye Goodie, MD  ASSISTANT:  (n/a)   ANESTHESIA:  General endotracheal intubation anesthesia (GETA) and Regional TRANSVERSUS ABDOMINIS PLANE (TAP) nerve block -BILATERAL for perioperative & postoperative pain control at the level of the transverse abdominis & preperitoneal spaces along the flank at the anterior axillary line, from subcostal ridge to iliac crest under laparoscopic guidance provided with liposomal bupivacaine  (Experel) 20mL mixed with 60 mL of bupivicaine 0.25% with epinephrine   Estimated Blood Loss (EBL):   Total I/O In: 500 [IV Piggyback:500] Out: 780 [Urine:500; Blood:280].   (See anesthesia record)  Delay start of Pharmacological VTE agent (>24hrs) due to concerns of significant anemia, surgical blood loss, or risk of bleeding?:  yes  DRAINS:  LUQ: 19 Fr Blake drain the tip resting in the pelvis RLQ: 19 Fr Blake drain the tip resting in the abdominal wall / hernia sacs  SPECIMEN:   UROSTOMY  DISPOSITION OF SPECIMEN:  Pathology  COUNTS:  Sponge, needle, & instrument counts CORRECT at the conclusion of the case.      PLAN OF CARE: Admit to inpatient   PATIENT DISPOSITION:  PACU - hemodynamically stable.  INDICATION: Pleasant patient has developed a ventral wall abdominal hernias.  History of bladder cancer requiring cystectomy with permanent ileal conduit.  Diverting loop colostomy.  Loop ostomy takedown.  Has had colon and left lower quadrant, small bowel midline, and small bowel in right lower quadrant ileal conduit.  Increasing in size for the past several years.  Initially declined surgery with worsening pain agreed to surgery.  Recommendation was made for surgical repair  The anatomy & physiology of the abdominal wall was discussed. The pathophysiology of hernias was discussed. Natural history risks without surgery including progeressive enlargement, pain, incarceration & strangulation was discussed. Contributors to complications such as smoking, obesity, diabetes, prior surgery, etc were discussed.  I feel the risks of no intervention will lead to serious problems that outweigh the operative risks; therefore, I recommended surgery to reduce and repair the hernia. I explained laparoscopic techniques with possible need for an open approach. I noted the probable use of mesh to patch and/or buttress the hernia repair.  Risks such as bleeding, infection, abscess, need for further treatment, heart attack, death, and other risks were discussed. I noted a good likelihood this will help address the problem. Goals of post-operative recovery were discussed as well. Possibility that this will not correct all symptoms was explained. I stressed the importance of low-impact activity, aggressive pain control, avoiding constipation, & not pushing through pain to minimize risk of post-operative chronic pain or injury. Possibility of reherniation especially with smoking, obesity, diabetes,  immunosuppression, and other health conditions was discussed. We will work to minimize complications.  An educational handout further explaining the pathology & treatment options was given as well. Questions were answered. The patient expresses understanding & wishes to proceed with surgery.   OR FINDINGS:  Patient had dense adhesions anterior abdominal wall.  Largest hernia was in the right lower quadrant around the ileal conduit urostomy.  16 x 12 cm region.  Incarcerated with over a foot of small bowel.  Midline next largest 9 x 8 cm incarcerated with numerous loops of small bowel.  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  Another direct space hernia in the left lower quadrant and indirect hernia on the left lower quadrant.  No obvious hernias on the right side.  Component separations (TAR) done on both sides to help get the largest midline and parastomal hernias closed down with running serrated Strattafix absorbable suture.  Broad underlay repair with 30 x 35 cm mesh transversely laid  Type of ventral wall repair:  Robotic underlay repair .with Primary repair of largest 3 hernias Placement of mesh: Centrally intraperitoneal with edges tucked into RECTRORECTUS & preperitoneal space Name of mesh: Bard Ventralight dual sided (polypropylene / Seprafilm) Size of mesh: 35x30cm Orientation: Transverse Mesh overlap:  7cm    DESCRIPTION:  Informed consent was confirmed.  The patient underwent general anaesthesia without difficulty.  The patient was positioned appropriately.  VTE prevention in place.  Patient's urostomy was sterilely intubated with a Foley balloon and prepped into the field.  The patient's abdomen was clipped, prepped, & draped in a sterile fashion.  Surgical timeout confirmed our plan.  Peritoneal entry with a laparoscopic port was obtained using Varess spring needle entry technique in the left upper abdomen as the patient was positioned in reverse  Trendelenburg.  I induced carbon dioxide insufflation.  No change in end tidal CO2 measurements.  Full symmetrical abdominal distention.  Initial port was carefully placed.  Camera inspection revealed no injury.  Extra ports were carefully placed under direct laparoscopic visualization.   Used to do some laparoscopic adhesions to free the epigastric and subcostal incisions ports.  Robot was carefully docked.  I focused on robotic lyse adhesions using vessel sealer and occasional cold sharp scissors.  Released omentum and small bowel.  Encounters numerous hernias.  Left lower quadrant, central periumbilical, right lower quadrant around the urostomy.  I focused on the midline hernia.  Had very dense adhesions.  Did sharp dissection to try and free the adhesions around the edges of the hernia sac.  Then had to go inside the hernia sac to free off the small bowel adhesions that were densely adherent to the hernia sac circumferentially.  Eventually freed and reduced that down.  Focused on the right lower quadrant.  Even larger.  Had to work loop by loop to free off areas.  The cecum ascending colon and terminal ileum were up and they are incarcerated.  Delcia Feeling that off first.  Then started running the small bowel and frank that down.  Had to work laterally medially superiorly and then even inferiorly.  Worked to identify the ileal conduit and work to try and preserve it.  Eventually I had to free very dense concrete adhesions to the ileal conduit and got the bowel reduced.  The dissection was concerned I got into the urostomy ileal conduit very high up in the hernia sac after doing some tension and dissection.  Freed interloop adhesions in the small bowel and found the prior side-to-side anastomosis where the ileal conduit had been  a donor site.  Freed off the ileal conduit to its attachments to the hernia sac as well.  I was concerned there was some ischemia in the mesentery was not great since the urostomy conduit was  over a foot up into the hernia sac.  To assess vascular perfusion of tissues, we asked anesthesia use intravenous  indocyanine green  (ICG) with IV flush.  I switched to the NIR fluorescence (Firefly mode) imaging window on the daVinci robot platform.  We were able to see good light green visualization of blood vessels with good vascular perfusion of tissues of the ileal urostomy up to a point where the mesentery was thinned out.  That was the distal 5 cm.  Saw 5 mm defect in the ileal wall of the urostomy distal to that close to the skin.  I could see the Foley balloon at that location.  Anticipated needing to have to resect that.  Then turned attention to the left left lower quadrant hernia containing sigmoid colon.  Mobilized the colon from the descending colon distally in a lateral medial fashion and eventually freed off of the sigmoid colon out of the paracolostomy hernia.  Found a direct space hernia as well.  Found an indirect inguinal hernia as well.  Freed the rectosigmoid colon off the retroperitoneum and lateral pelvis as well.  Freed off omental and other adhesions off the pubis and anterior pelvis where the bladder had been from prior cystectomy.  He had a decent fat plane and that actually was the easiest thing to dissect.  I then ran the small bowel and did meticulous inspection to prove no injury or other concern.  Did have some oozing on the mesentery that we controlled with touch point cautery.  Vessel sealer as well.   Is quite apparent that the 2 largest hernias in the periumbilical and right lower quadrant were rigid with no laxity to come together.  I therefore felt the need to do component separation.  A transversus abdominis release (TAR) was performed on both sides.  The transversus abdominis muscle was identified within the posterior rectus sheath and incised vertically along its entire length, entering the pre-peritoneal or pre-transversalis fascia plane.  The peritoneum was  subsequently peeled away from the underside of the divided transversus abdominis muscle.  This dissection was carried out laterally towards the retroperitoneum and was also carried out lateral to the colon.  The TAR accomplished additional medialization of the posterior rectus sheath with its attached peritoneum towards the midline to allow for visceral sac closure.  The TAR also provided further offset of tension with advancement of the rectus muscle towards the midline, as it remained attached to the external and internal abdominal oblique muscles.  I then approximated the parastomal hernia around the ileal conduit using #1 absorbable serrated STRATAFIX suture from superolaterally and inferomedially working towards the center.  That seemed to provide the best closure.  There was some thinning out cephalad but intact.  I then closed the midline hernia vertically using a #1 stratafix suture in a running fashion   I made sure hemostasis was good.  I mapped out the region using a needle passer.   To ensure that I would have at least 5 cm radial coverage outside of the hernia defect, I chose a 35x30cm dual sided mesh.  I undocked the robot, and I rolled the mesh & placed into the peritoneal cavity through the lower quadrant spigelian/direct space type defect.  I unrolled the mesh and positioned it  appropriately.  Made sure that the mesh laid onto the retroperitoneum's on both side as well as into the lower pelvis to make sure there is plenty overlap.  I directed the urostomy ileal conduit superolaterally where it had the least amount of tension and made sure the mesh laid underneath it and hammock it laterally in the classic Sugarbaker fashion.  I secured the mesh using secure strap x 2 around the edges and cephalad and especially along the pubic tubercle.  The mesh laid onto the retroperitoneum posteriorly.  It was lateral to the mid axillary lines on both sides.  Upper edge was supraumbilical, at least 7 cm  cephalad to the periumbilical closure.  Laparoscopic exploration and serial aliquots of several liters.  Got rid of all clots and oozing.  Gated and oozing or and a small bleeder in the omentum that we controlled with touch point cautery.  Hemostasis adequate.  Reinspection saw no injury or other concerns.  Because it been rather oozy I placed a drain from the left upper quadrant subcostal port site and laid along the left gutter and down towards the pelvis.  Secured the skin with Prolene.  We evacuated CO2 & desufflated the abdomen.  I primarily closed the left lower quadrant spigelian/direct space type hernia with Vicryl suture to help close that hernia down and tack it to the mesh.  I completed a broad field block of local anesthesia at fascial stitch sites & fascial closure areas.    I excised the urostomy from the dermis circumferentially gone into the large hernia sac.  Delcia Feeling off the last few main attachments and pulled up the urostomy.  I placed another 4 [place] Blake drain into the hernia sac from a right flank poke hole into the subcutaneous tissue carefully.  I then also directed that drain to go across midline to the supraumbilical and left lower quadrant colostomy hernia sacs as well for the area to drain well and avoid recurrent seromas.  I reinspected the ileal urostomy.  I could see the demarcation some point where the mesentery of the ileal urostomy conduit had been very thinned out and a tiny enterotomy.  That areas been out.  Found an area 6 cm proximally that was soft and uninflamed with no concerning dissections.  Good full thickness.  I placed a I transected at this region and got healthy bleeding mucosa.  I did a 1 cm Brooke ileostomy for the urostomy conduit using interrupted Vicryl sutures starting in 4 corners and then getting good apposition circumferentially with interrupted suture circumferentially.  Sterile urostomy appliance placed.  Ports were removed. The skin was closed with  Monocryl at the port sites and Steri-Strips on the fascial stitch puncture sites.  Patient is being extubated to go to the recovery room.  I discussed operative findings, updated the patient's status, discussed probable steps to recovery, and gave postoperative recommendations to the patient's spouse.  Recommendations were made.  Questions were answered.  She expressed understanding & appreciation.  Eddye Goodie, M.D., F.A.C.S. Gastrointestinal and Minimally Invasive Surgery [facility] [address], [address] [place], [place] [address] [phone] Main / Paging  04/10/2024 4:47 PM

## 2024-04-10 NOTE — Interval H&P Note (Signed)
 History and Physical Interval Note:  04/10/2024 6:58 AM  Gil La  has presented today for surgery, with the diagnosis of PARASTOMAL AND INCISIONAL INCARCERATED ABDOMINAL WALL HERNIA.  The various methods of treatment have been discussed with the patient and family. After consideration of risks, benefits and other options for treatment, the patient has consented to  Procedure(s): REPAIR, HERNIA, PARASTOMAL, ROBOT-ASSISTED (N/A) REPAIR, HERNIA, VENTRAL (N/A) as a surgical intervention.  The patient's history has been reviewed, patient examined, no change in status, stable for surgery.  I have reviewed the patient's chart and labs.  Questions were answered to the patient's satisfaction.    I have re-reviewed the the patient's records, history, medications, and allergies.  I have re-examined the patient.  I again discussed intraoperative plans and goals of post-operative recovery.  The patient agrees to proceed.  KEYON LILLER  Oct 01, 1951 [id number]  Patient Care Team: Dudley Ghee, MD as PCP - General (Internal Medicine) Devon Fogo, MD (Inactive) as Consulting Physician (Dermatology) Lavina Pou, MD as Referring Physician (Pulmonary Disease) Candyce Champagne, MD as Consulting Physician (General Surgery) Florencio Hunting, MD as Consulting Physician (Urology)  Patient Active Problem List   Diagnosis Date Noted   Irritant contact dermatitis associated with urinary stoma 05/28/2023   Non-recurrent bilateral inguinal hernia without obstruction or gangrene 05/28/2023   Complication of urostomy (HCC) 05/28/2023   Lower urinary tract infectious disease    Prolonged QT interval 12/07/2018   Hydronephrosis 12/07/2018   Pyelonephritis 06/15/2018   Leukocytosis 06/15/2018   Hyperkalemia 06/15/2018   AKI (acute kidney injury) (HCC) 06/15/2018   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   Candida infection    Postoperative intra-abdominal abscess (HCC)    Abscess    Fever  10/09/2017   Bacteremia due to Klebsiella pneumoniae 10/09/2017   Urine leakage from surgical incision    Sepsis (HCC) 10/04/2017   GERD (gastroesophageal reflux disease) 08/11/2017   Obesity (BMI 30-39.9) 08/11/2017   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 08/08/2017 08/10/2017   S/P ileal conduit (HCC) 08/08/2017   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 08/08/2017 08/08/2017    Past Medical History:  Diagnosis Date   At risk for sleep apnea    12-25-2017   STOP-BANG SCORE= 5   --- SENT TO PCP   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   moderate atypia - right low back   Atypical nevus 04/04/2007   moderate to marked - right upper back (wider shave)   Atypical nevus 04/04/2007   moderate atypia - center chest (wider shave)   Atypical nevus 04/04/2007   slight atypia - right thigh   Atypical nevus 11/29/2011   mild atypia - center upper back   Atypical nevus 11/29/2011   mild atypia - center chest   Bladder cancer (HCC) dx 07/2017   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  s/p  cystectomy w/ ileal conduit urinary diversion   CHF (congestive heart failure) (HCC)    Colostomy in place (HCC)    since 08-08-2017-- per pt 12-25-2017 reddness around stoma   Diabetes mellitus without complication (HCC)    GERD (gastroesophageal reflux disease)    H/O hiatal hernia    History of sepsis 09/2017   dx bacteremia due to klebsiella pneumoniae,  post op intraabdominal abscess   Prostate cancer [facility]) urologist-- dr Rozanne Corners   10-02-2012 s/p  prostatectomy-- Stage T1c   RBBB    Renal disorder    pt. denies   Sleep apnea    cpap   Squamous  cell carcinoma of skin 05/22/2013   left cheek - CX3 + 5FU   Wears glasses     Past Surgical History:  Procedure Laterality Date   ABDOMINAL SURGERY     APPENDECTOMY  1972   CHOLECYSTECTOMY  1985   COLONOSCOPY N/A 06/29/2021   Procedure: COLONOSCOPY;  Surgeon: Joyce Nixon, MD;  Location: [facility];  Service: Endoscopy;  Laterality: N/A;    COLOSTOMY REVERSAL N/A 01/08/2018   Procedure: COLOSTOMY REVERSAL;  Surgeon: Joyce Nixon, MD;  Location: [facility];  Service: General;  Laterality: N/A;   CYSTOSCOPY WITH RETROGRADE PYELOGRAM, URETEROSCOPY AND STENT PLACEMENT Right 06/10/2017   Procedure: CYSTOSCOPY WITH RIGHT URETEROSCOPY WITH RIGHT STENT PLACEMENT;  Surgeon: Florencio Hunting, MD;  Location: [facility];  Service: Urology;  Laterality: Right;   EUS N/A 04/16/2018   Procedure: FULL UPPER ENDOSCOPIC ULTRASOUND (EUS) RADIAL;  Surgeon: Evangeline Hilts, MD;  Location: [facility];  Service: Endoscopy;  Laterality: N/A;   FLEXIBLE SIGMOIDOSCOPY N/A 12/13/2017   Procedure: FLEXIBLE SIGMOIDOSCOPY;  Surgeon: Joyce Nixon, MD;  [facility];  Service: Endoscopy;  Laterality: N/A;   ILEO CONDUIT     IR CATHETER TUBE CHANGE  12/13/2017   IR CATHETER TUBE CHANGE  01/17/2018   IR CATHETER TUBE CHANGE  02/28/2018   IR CATHETER TUBE CHANGE  07/18/2018   IR CATHETER TUBE CHANGE  08/22/2018   IR CATHETER TUBE CHANGE  10/31/2018   IR CONVERT LEFT NEPHROSTOMY TO NEPHROURETERAL CATH  10/24/2017   IR EXT NEPHROURETERAL CATH EXCHANGE  05/19/2018   IR EXT NEPHROURETERAL CATH EXCHANGE  06/16/2018   IR NEPHRO TUBE REMOV/FL  10/24/2017   IR NEPHROSTOGRAM LEFT THRU EXISTING ACCESS  12/05/2018   IR NEPHROSTOMY PLACEMENT LEFT  10/07/2017   IR NEPHROSTOMY TUBE CHANGE  04/11/2018   POLYPECTOMY  06/29/2021   Procedure: POLYPECTOMY;  Surgeon: Joyce Nixon, MD;  [facility];  Service: Endoscopy;;   ROBOT ASSISTED LAPAROSCOPIC RADICAL PROSTATECTOMY  10/02/2012   Procedure: ROBOTIC ASSISTED LAPAROSCOPIC RADICAL PROSTATECTOMY LEVEL 2;  Surgeon: [name], MD;  Location: [facility];  Service: Urology;  Laterality: N/A;   ROBOTIC ASSISTED LAPAROSCOPIC BLADDER DIVERTICULECTOMY N/A 08/08/2017   Procedure: XI ROBOTIC ASSISTED LAPAROSCOPIC RADICAL CYSTECTOMY COVERTED TO OPEN PELVIC LYMPHADNECTOMY BILATERAL AND ILEAL CONDUIT URINARY DIVERSION;  Surgeon: Florencio Hunting,  MD;  Location: [facility];  Service: Urology;  Laterality: N/A;   TRANSURETHRAL RESECTION OF BLADDER TUMOR  06/10/2017   Procedure: TRANSURETHRAL RESECTION OF BLADDER TUMOR (TURBT);  Surgeon: Florencio Hunting, MD;  Location[facility];  Service: Urology;;   XI ROBOT ABDOMINAL PERINEAL RESECTION N/A 08/08/2017   Procedure: REPAIR OF RECTAL TEAR POSSIBLE PARTIAL PROCTECTOMY, CREATION OF  OSTOMY;  Surgeon: Joyce Nixon, MD;  Location: [facility];  Service: General;  Laterality: N/A;    Social History   Socioeconomic History   Marital status: Married    Spouse name: Not on file   Number of children: Not on file   Years of education: Not on file   Highest education level: Not on file  Occupational History   Not on file  Tobacco Use   Smoking status: Former    Current packs/day: 0.00    Types: Cigarettes    Start date: 12/24/1970    Quit date: 12/25/1979    Years since quitting: 44.3   Smokeless tobacco: Never  Vaping Use   Vaping status: Never Used  Substance and Sexual Activity   Alcohol  use: Yes    Comment: rare   Drug use: No  Sexual activity: Yes  Other Topics Concern   Not on file  Social History Narrative   Not on file   Social Drivers of Health   Financial Resource Strain: Not on file  Food Insecurity: Not on file  Transportation Needs: Not on file  Physical Activity: Not on file  Stress: Not on file  Social Connections: Not on file  Intimate Partner Violence: Not on file    Family History  Problem Relation Age of Onset   Lung cancer Mother    Hypertension Father    Colon cancer Other    CAD Neg Hx    Diabetes Neg Hx    Stroke Neg Hx     Medications Prior to Admission  Medication Sig Dispense Refill Last Dose/Taking   acetaminophen  (TYLENOL ) 500 MG tablet Take 1,000 mg by mouth every 8 (eight) hours as needed for mild pain or headache.    Past Week   bisacodyl  (DULCOLAX) 5 MG EC tablet Take 2 tablets (10 mg total) by mouth daily as needed for moderate constipation.  (Patient taking differently: Take 15 mg by mouth daily.) 20 tablet 0 Past Week   Cyanocobalamin  (VITAMIN B-12 PO) Place 1 tablet under the tongue 3 (three) times a week.   Past Week   D-MANNOSE PO Take 2-3 tablets by mouth See admin instructions. Take 2 tablets by mouth every morning & take 1 tablet by mouth at bedtime.   Past Week   diphenhydrAMINE  (BENADRYL ) 25 mg capsule Take 25 mg by mouth every other day as needed (congestion/allergies (every other night)).   Past Week   ELIQUIS 5 MG TABS tablet Take 5 mg by mouth 2 (two) times daily.   04/07/2024 Evening   ipratropium (ATROVENT) 0.03 % nasal spray Place 1 spray into both nostrils 3 (three) times daily as needed (congestion).   Past Week   metFORMIN (GLUCOPHAGE) 500 MG tablet Take 500 mg by mouth in the morning.   04/09/2024   Multiple Vitamins-Minerals (CENTRUM MINIS ADULTS 50+) TABS Take 1 tablet by mouth See admin instructions. Take 1 tablet by mouth twice daily on every other day.   Past Week   Omega-3 Fatty Acids (FISH OIL PO) Take 1 capsule by mouth 3 (three) times a week.   Past Week   polyethylene glycol (MIRALAX ) 17 g packet Take 17 g by mouth daily as needed (constipation.).   Past Week   rosuvastatin (CRESTOR) 10 MG tablet Take 10 mg by mouth every evening.   04/09/2024   triamcinolone  cream (KENALOG ) 0.1 % Apply 1 application topically daily as needed. 60 g 3 Past Week    Current Facility-Administered Medications  Medication Dose Route Frequency Provider Last Rate Last Admin   bupivacaine  liposome (EXPAREL ) 1.3 % injection 266 mg  20 mL Infiltration Once Candyce Champagne, MD       ceFAZolin  (ANCEF ) IVPB 2g/100 mL premix  2 g Intravenous On Call to OR Candyce Champagne, MD       Chlorhexidine  Gluconate Cloth 2 % PADS 6 each  6 each Topical Once Issaiah Seabrooks, Landon Pinion, MD       insulin aspart (novoLOG) injection 0-7 Units  0-7 Units Subcutaneous Q2H PRN Jonne Netters, MD       lactated ringers  infusion   Intravenous Continuous Jonne Netters,  MD 10 mL/hr at 04/10/24 1610 New Bag at 04/10/24 0624     Allergies  Allergen Reactions   Demerol  [Meperidine ] Nausea And Vomiting and Other (See Comments)    SEVERE NAUSEA  BP 123/71   Pulse (!) 56   Temp 98.1 F (36.7 C) (Oral)   Resp 16   Ht 5\' 11"  (1.803 m)   Wt 108.9 kg   SpO2 100%   BMI 33.47 kg/m   Labs: Results for orders placed or performed during the hospital encounter of 04/10/24 (from the past 48 hours)  Glucose, capillary     Status: Abnormal   Collection Time: 04/10/24  5:41 AM  Result Value Ref Range   Glucose-Capillary 104 (H) 70 - 99 mg/dL    Comment: Glucose reference range applies only to samples taken after fasting for at least 8 hours.   Comment 1 Notify RN    Comment 2 Document in Chart     Imaging / Studies: No results found.   Eddye Goodie, M.D., F.A.C.S. Gastrointestinal and Minimally Invasive Surgery [facility] [address] [place], [place] [address] [phone] Main / Paging  04/10/2024 6:58 AM    Eddye Goodie

## 2024-04-10 NOTE — Anesthesia Procedure Notes (Signed)
 Procedure Name: Intubation Date/Time: 04/10/2024 7:34 AM  Performed by: Jonne Netters, MDPre-anesthesia Checklist: Patient identified, Emergency Drugs available, Suction available and Patient being monitored Patient Re-evaluated:Patient Re-evaluated prior to induction Oxygen Delivery Method: Circle system utilized Preoxygenation: Pre-oxygenation with 100% oxygen Induction Type: IV induction Ventilation: Mask ventilation without difficulty Laryngoscope Size: Mac and 3 Tube type: Oral Tube size: 7.5 mm Number of attempts: 2 (CRNA, MD) Airway Equipment and Method: Stylet and Oral airway Placement Confirmation: ETT inserted through vocal cords under direct vision, positive ETCO2 and breath sounds checked- equal and bilateral Secured at: 22 cm Tube secured with: Tape Dental Injury: Teeth and Oropharynx as per pre-operative assessment

## 2024-04-10 NOTE — Transfer of Care (Signed)
 Immediate Anesthesia Transfer of Care Note  Patient: Kristopher Thompson  Procedure(s) Performed: REPAIR, HERNIA, PARASTOMAL AND VENTRAL HERNIAS, ROBOT-ASSISTED, LEFT INGUINAL HERNIA, LYSIS OF ADHESIONS INCARCERATED AND INSIONAL HERNIAS AND UROSTOMY REVISION REPAIR, HERNIA, VENTRAL  Patient Location: PACU  Anesthesia Type:General  Level of Consciousness: sedated  Airway & Oxygen Therapy: Patient Spontanous Breathing and Patient connected to face mask oxygen  Post-op Assessment: Report given to RN and Post -op Vital signs reviewed and stable  Post vital signs: Reviewed and stable  Last Vitals:  Vitals Value Taken Time  BP 159/78 04/10/24 1655  Temp    Pulse 96 04/10/24 1659  Resp 27 04/10/24 1659  SpO2 100 % 04/10/24 1659  Vitals shown include unfiled device data.  Last Pain:  Vitals:   04/10/24 0536  TempSrc: Oral  PainSc: 0-No pain         Complications: No notable events documented.

## 2024-04-11 LAB — CBC
HCT: 39.5 % (ref 39.0–52.0)
Hemoglobin: 12.5 g/dL — ABNORMAL LOW (ref 13.0–17.0)
MCH: 28.4 pg (ref 26.0–34.0)
MCHC: 31.6 g/dL (ref 30.0–36.0)
MCV: 89.8 fL (ref 80.0–100.0)
Platelets: 188 10*3/uL (ref 150–400)
RBC: 4.4 MIL/uL (ref 4.22–5.81)
RDW: 13.9 % (ref 11.5–15.5)
WBC: 11.1 10*3/uL — ABNORMAL HIGH (ref 4.0–10.5)
nRBC: 0 % (ref 0.0–0.2)

## 2024-04-11 LAB — GLUCOSE, CAPILLARY
Glucose-Capillary: 149 mg/dL — ABNORMAL HIGH (ref 70–99)
Glucose-Capillary: 145 mg/dL — ABNORMAL HIGH (ref 70–99)
Glucose-Capillary: 137 mg/dL — ABNORMAL HIGH (ref 70–99)
Glucose-Capillary: 177 mg/dL — ABNORMAL HIGH (ref 70–99)

## 2024-04-11 MED ORDER — KCL IN DEXTROSE-NACL 20-5-0.45 MEQ/L-%-% IV SOLN
INTRAVENOUS | Status: AC
  Filled 2024-04-11 (×2): qty 1000

## 2024-04-11 NOTE — Plan of Care (Signed)

## 2024-04-11 NOTE — Progress Notes (Signed)
 Pt on CPAP. Pt is tolerating well with no distress noted. Will continue to monitor pt.     04/11/24 2300  BiPAP/CPAP/SIPAP  BiPAP/CPAP/SIPAP Pt Type Adult  BiPAP/CPAP/SIPAP Resmed  Dentures removed? Not applicable  EPAP 7 cmH2O  FiO2 (%) 21 %  Patient Home Machine No  Patient Home Mask Yes  Patient Home Tubing Yes  Auto Titrate No  CPAP/SIPAP surface wiped down Yes  Device Plugged into RED Power Outlet Yes  BiPAP/CPAP /SiPAP Vitals  Pulse Rate 98  Resp 18  SpO2 94 %  MEWS Score/Color  MEWS Score 1  MEWS Score Color Green

## 2024-04-11 NOTE — Plan of Care (Signed)
   Problem: Clinical Measurements: Goal: Will remain free from infection Outcome: Progressing Goal: Diagnostic test results will improve Outcome: Progressing

## 2024-04-11 NOTE — Progress Notes (Signed)
   04/11/24 0411  BiPAP/CPAP/SIPAP  $ Non-Invasive Home Ventilator  Initial  BiPAP/CPAP/SIPAP Pt Type Adult  BiPAP/CPAP/SIPAP Resmed  Mask Type Nasal mask  Dentures removed? Not applicable  FiO2 (%) 21 %  Patient Home Machine No  Patient Home Mask Yes  Patient Home Tubing Yes  Auto Titrate No  CPAP/SIPAP surface wiped down Yes  Device Plugged into RED Power Outlet Yes

## 2024-04-11 NOTE — Progress Notes (Signed)
   04/10/24 2050  Assess: MEWS Score  Temp (!) 97.4 F (36.3 C)  BP 130/76  MAP (mmHg) 88  Pulse Rate (!) 112  Resp 18  SpO2 100 %  O2 Device Room Air  Assess: MEWS Score  MEWS Temp 0  MEWS Systolic 0  MEWS Pulse 2  MEWS RR 0  MEWS LOC 0  MEWS Score 2  MEWS Score Color Yellow  Assess: if the MEWS score is Yellow or Red  Were vital signs accurate and taken at a resting state? Yes  Does the patient meet 2 or more of the SIRS criteria? No  MEWS guidelines implemented  Yes, yellow  Treat  MEWS Interventions Considered administering scheduled or prn medications/treatments as ordered  Take Vital Signs  Increase Vital Sign Frequency  Yellow: Q2hr x1, continue Q4hrs until patient remains green for 12hrs  Escalate  MEWS: Escalate Yellow: Discuss with charge nurse and consider notifying provider and/or RRT  Notify: Charge Nurse/RN  Name of Charge Nurse/RN Notified Jeremiyah Cullens RN  Provider Notification  Provider Name/Title Donavan Fuchs  Date Provider Notified 04/10/24  Time Provider Notified 2237  Method of Notification Page  Notification Reason Change in status  Provider response See new orders  Date of Provider Response 04/10/24  Time of Provider Response 2238  Assess: SIRS CRITERIA  SIRS Temperature  0  SIRS Respirations  0  SIRS Pulse 1  SIRS WBC 0  SIRS Score Sum  1   MD ordered NS bolus and CBC

## 2024-04-11 NOTE — Progress Notes (Signed)
 1 Day Post-Op   Subjective/Chief Complaint: Pt doing well this AM Some soreness as expected   Objective: Vital signs in last 24 hours: Temp:  [97.4 F (36.3 C)-98.6 F (37 C)] 98.6 F (37 C) (05/31 0458) Pulse Rate:  [93-119] 114 (05/31 0458) Resp:  [14-29] 17 (05/31 0458) BP: (114-163)/(63-89) 127/63 (05/31 0458) SpO2:  [95 %-100 %] 96 % (05/31 0458) FiO2 (%):  [21 %] 21 % (05/31 0411)    Intake/Output from previous day: 05/30 0701 - 05/31 0700 In: 3438 [P.O.:500; I.V.:1837.9; IV Piggyback:1100.1] Out: 1835 [Urine:1075; Drains:480; Blood:280] Intake/Output this shift: No intake/output data recorded.  General appearance: alert and cooperative GI: soft, non-tender; bowel sounds normal; no masses,  no organomegaly and inc c/d/I, JP SS  Lab Results:  Recent Labs    04/11/24 0534  WBC 11.1*  HGB 12.5*  HCT 39.5  PLT 188   BMET No results for input(s): "NA", "K", "CL", "CO2", "GLUCOSE", "BUN", "CREATININE", "CALCIUM " in the last 72 hours. PT/INR No results for input(s): "LABPROT", "INR" in the last 72 hours. ABG No results for input(s): "PHART", "HCO3" in the last 72 hours.  Invalid input(s): "PCO2", "PO2"  Studies/Results: No results found.  Anti-infectives: Anti-infectives (From admission, onward)    Start     Dose/Rate Route Frequency Ordered Stop   04/10/24 2200  ceFAZolin  (ANCEF ) IVPB 2g/100 mL premix        2 g 200 mL/hr over 30 Minutes Intravenous Every 8 hours 04/10/24 1833 04/11/24 0531   04/10/24 0600  ceFAZolin  (ANCEF ) IVPB 2g/100 mL premix        2 g 200 mL/hr over 30 Minutes Intravenous On call to O.R. 04/10/24 0533 04/10/24 1526       Assessment/Plan: s/p Procedure(s): REPAIR, HERNIA, PARASTOMAL AND VENTRAL HERNIAS, ROBOT-ASSISTED, LEFT INGUINAL HERNIA, LYSIS OF ADHESIONS INCARCERATED AND INSIONAL HERNIAS AND UROSTOMY REVISION (N/A) REPAIR, HERNIA, VENTRAL (N/A) POD#1 -starting FLD today, will go slow -mobilize   LOS: 1 day     Shela Derby 04/11/2024

## 2024-04-12 LAB — BASIC METABOLIC PANEL WITH GFR
Anion gap: 8 (ref 5–15)
BUN: 40 mg/dL — ABNORMAL HIGH (ref 8–23)
CO2: 22 mmol/L (ref 22–32)
Calcium: 8.5 mg/dL — ABNORMAL LOW (ref 8.9–10.3)
Chloride: 103 mmol/L (ref 98–111)
Creatinine, Ser: 2.08 mg/dL — ABNORMAL HIGH (ref 0.61–1.24)
GFR, Estimated: 33 mL/min — ABNORMAL LOW (ref >60)
Glucose, Bld: 163 mg/dL — ABNORMAL HIGH (ref 70–99)
Potassium: 5.1 mmol/L (ref 3.5–5.1)
Sodium: 133 mmol/L — ABNORMAL LOW (ref 135–145)

## 2024-04-12 LAB — CBC
HCT: 34.4 % — ABNORMAL LOW (ref 39.0–52.0)
Hemoglobin: 11.2 g/dL — ABNORMAL LOW (ref 13.0–17.0)
MCH: 28.1 pg (ref 26.0–34.0)
MCHC: 32.6 g/dL (ref 30.0–36.0)
MCV: 86.4 fL (ref 80.0–100.0)
Platelets: 181 10*3/uL (ref 150–400)
RBC: 3.98 MIL/uL — ABNORMAL LOW (ref 4.22–5.81)
RDW: 14 % (ref 11.5–15.5)
WBC: 17.5 10*3/uL — ABNORMAL HIGH (ref 4.0–10.5)
nRBC: 0 % (ref 0.0–0.2)

## 2024-04-12 LAB — MAGNESIUM: Magnesium: 2.1 mg/dL (ref 1.7–2.4)

## 2024-04-12 LAB — GLUCOSE, CAPILLARY
Glucose-Capillary: 149 mg/dL — ABNORMAL HIGH (ref 70–99)
Glucose-Capillary: 163 mg/dL — ABNORMAL HIGH (ref 70–99)
Glucose-Capillary: 104 mg/dL — ABNORMAL HIGH (ref 70–99)
Glucose-Capillary: 153 mg/dL — ABNORMAL HIGH (ref 70–99)

## 2024-04-12 LAB — PHOSPHORUS: Phosphorus: 2.7 mg/dL (ref 2.5–4.6)

## 2024-04-12 MED ORDER — PANTOPRAZOLE SODIUM 40 MG PO TBEC
40.0000 mg | DELAYED_RELEASE_TABLET | Freq: Every day | ORAL | Status: DC
  Administered 2024-04-12: 40 mg via ORAL
  Filled 2024-04-12: qty 1

## 2024-04-12 MED ORDER — ALUM & MAG HYDROXIDE-SIMETH 200-200-20 MG/5ML PO SUSP
30.0000 mL | Freq: Four times a day (QID) | ORAL | Status: DC | PRN
Start: 2024-04-12 — End: 2024-04-20
  Administered 2024-04-12 (×2): 30 mL via ORAL
  Filled 2024-04-12 (×2): qty 30

## 2024-04-12 MED ORDER — LACTATED RINGERS IV BOLUS
1000.0000 mL | Freq: Once | INTRAVENOUS | Status: AC
  Administered 2024-04-12: 1000 mL via INTRAVENOUS

## 2024-04-12 MED ORDER — LACTATED RINGERS IV BOLUS: 1000.0000 mL | Freq: Three times a day (TID) | INTRAVENOUS | Status: DC | PRN

## 2024-04-12 MED ORDER — CALCIUM CARBONATE ANTACID 500 MG PO CHEW
1.0000 | CHEWABLE_TABLET | Freq: Two times a day (BID) | ORAL | Status: DC
  Administered 2024-04-12 – 2024-04-18 (×2): 200 mg via ORAL
  Filled 2024-04-12 (×5): qty 1

## 2024-04-12 NOTE — Evaluation (Signed)
 Physical Therapy Evaluation Patient Details Name: Kristopher Thompson MRN: [id number] DOB: 29-Feb-1951 Today's Date: 04/12/2024  History of Present Illness  Pt s/p repair of incisional, parastomal, and Left inguinal incarcerated abdominal hernias on 04/10/24 and with hx of CHF, bladder CA, prostate CA, DM, renal disorder, RBBB, and multiple abdominal surgeries  Clinical Impression  Pt admitted as above and presenting with functional mobility limitations 2* generalized weakness and post op pain increased with activity.  Pt hopes to progress to dc home with family assist.        If plan is discharge home, recommend the following: A lot of help with walking and/or transfers;A little help with bathing/dressing/bathroom;Assistance with cooking/housework;Assist for transportation;Help with stairs or ramp for entrance   Can travel by private vehicle        Equipment Recommendations None recommended by PT  Recommendations for Other Services       Functional Status Assessment Patient has had a recent decline in their functional status and demonstrates the ability to make significant improvements in function in a reasonable and predictable amount of time.     Precautions / Restrictions Precautions Precautions: Fall;Other (comment) Recall of Precautions/Restrictions: Intact Precaution/Restrictions Comments: bil JP drains, abdominal binder Restrictions Weight Bearing Restrictions Per Provider Order: No      Mobility  Bed Mobility Overal bed mobility: Needs Assistance Bed Mobility: Rolling, Sidelying to Sit Rolling: Mod assist, +2 for physical assistance, +2 for safety/equipment Sidelying to sit: Mod assist, +2 for physical assistance, +2 for safety/equipment       General bed mobility comments: cues for technique    Transfers Overall transfer level: Needs assistance Equipment used: Rolling walker (2 wheels) Transfers: Sit to/from Stand, Bed to chair/wheelchair/BSC Sit to Stand: Min  assist, +2 physical assistance, +2 safety/equipment, From elevated surface   Step pivot transfers: Min assist, +2 physical assistance, +2 safety/equipment, From elevated surface       General transfer comment: cues for use of UEs to self assist.  Physical assist to bring wt up and fwd and to balance in initial standing; step pvt bed to recliner    Ambulation/Gait               General Gait Details: bed to chair only 2* pain  Stairs            Wheelchair Mobility     Tilt Bed    Modified Rankin (Stroke Patients Only)       Balance Overall balance assessment: Needs assistance Sitting-balance support: No upper extremity supported, Feet supported Sitting balance-Leahy Scale: Fair     Standing balance support: Bilateral upper extremity supported Standing balance-Leahy Scale: Poor                               Pertinent Vitals/Pain Pain Assessment Pain Assessment: 0-10 Pain Score: 9  (3/10 pain standing erect) Pain Location: abdomen Pain Descriptors / Indicators: Grimacing, Guarding, Throbbing Pain Intervention(s): Limited activity within patient's tolerance, Monitored during session, Premedicated before session    Home Living Family/patient expects to be discharged to:: Private residence Living Arrangements: Spouse/significant other Available Help at Discharge: Family;Available 24 hours/day Type of Home: House Home Access: Stairs to enter   [employer] of Steps: 1   Home Layout: One level Home Equipment: Rollator (4 wheels)      Prior Function Prior Level of Function : Independent/Modified Independent  Extremity/Trunk Assessment   Upper Extremity Assessment Upper Extremity Assessment: Overall WFL for tasks assessed    Lower Extremity Assessment Lower Extremity Assessment: Overall WFL for tasks assessed       Communication   Communication Communication: No apparent difficulties     Cognition Arousal: Alert Behavior During Therapy: WFL for tasks assessed/performed                             Following commands: Intact       Cueing Cueing Techniques: Verbal cues     General Comments      Exercises     Assessment/Plan    PT Assessment Patient needs continued PT services  PT Problem List Decreased strength;Decreased activity tolerance;Decreased balance;Decreased mobility;Decreased knowledge of use of DME;Decreased safety awareness;Obesity;Pain       PT Treatment Interventions DME instruction;Gait training;Stair training;Functional mobility training;Therapeutic activities;Therapeutic exercise;Balance training;Patient/family education    PT Goals (Current goals can be found in the Care Plan section)  Acute Rehab PT Goals Patient Stated Goal: Regain IND PT Goal Formulation: With patient Time For Goal Achievement: 04/26/24 Potential to Achieve Goals: Good    Frequency Min 3X/week     Co-evaluation               AM-PAC PT "6 Clicks" Mobility  Outcome Measure Help needed turning from your back to your side while in a flat bed without using bedrails?: A Lot Help needed moving from lying on your back to sitting on the side of a flat bed without using bedrails?: A Lot Help needed moving to and from a bed to a chair (including a wheelchair)?: A Lot Help needed standing up from a chair using your arms (e.g., wheelchair or bedside chair)?: A Lot Help needed to walk in hospital room?: A Lot Help needed climbing 3-5 steps with a railing? : Total 6 Click Score: 11    End of Session Equipment Utilized During Treatment: Gait belt Activity Tolerance: Patient limited by pain Patient left: in chair;with call bell/phone within reach;with chair alarm set Nurse Communication: Mobility status;Patient requests pain meds PT Visit Diagnosis: Difficulty in walking, not elsewhere classified (R26.2);Pain Pain - Right/Left:  (abdomen)    Time:  4098-1191 PT Time Calculation (min) (ACUTE ONLY): 19 min   Charges:   PT Evaluation $PT Eval Low Complexity: 1 Low   PT General Charges $$ ACUTE PT VISIT: 1 Visit         Thedora Finlay PT Acute Rehabilitation Services Pager [phone] Office [phone]   Soul Deveney 04/12/2024, 10:47 AM

## 2024-04-12 NOTE — TOC Initial Note (Signed)
 Transition of Care [facility]) - Initial/Assessment Note    Patient Details  Name: Kristopher Thompson MRN: [id number] Date of Birth: 12/28/1950  [facility] CM/SW Contact:    Levie Ream, RN Phone Number: 04/12/2024, 5:00 PM  Clinical Narrative:                 [name] w/ pt and wife [name]  [phone]) in room; pt says he lives at home; he plans to return at d/c; his wife will provide transportation; pt verified insurance/PCP; he denied SDOH risks; pt has cane and walker; he does not have HH services, or home oxygen; no recc per PT; TOC is following.  Expected Discharge Plan: Home/Self Care Barriers to Discharge: Continued Medical Work up   Patient Goals and CMS Choice Patient states their goals for this hospitalization and ongoing recovery are:: home CMS Medicare.gov Compare Post Acute Care list provided to:: Patient   Halfway ownership interest in [facility].provided to:: Patient    Expected Discharge Plan and Services   Discharge Planning Services: CM Consult   Living arrangements for the past 2 months: Single Family Home                                      Prior Living Arrangements/Services Living arrangements for the past 2 months: Single Family Home Lives with:: Spouse Patient language and need for interpreter reviewed:: Yes Do you feel safe going back to the place where you live?: Yes      Need for Family Participation in Patient Care: Yes (Comment) Care giver support system in place?: Yes (comment) Current home services: DME (cane, walker) Criminal Activity/Legal Involvement Pertinent to Current Situation/Hospitalization: No - Comment as needed  Activities of Daily Living   ADL Screening (condition at time of admission) Independently performs ADLs?: Yes (appropriate for developmental age) Is the patient deaf or have difficulty hearing?: No Does the patient have difficulty seeing, even when wearing  glasses/contacts?: No Does the patient have difficulty concentrating, remembering, or making decisions?: No  Permission Sought/Granted Permission sought to share information with : Case Manager Permission granted to share information with : Yes, Verbal Permission Granted  Share Information with NAME: Case Manager     Permission granted to share info w Relationship: [name] (spouse) [phone]     Emotional Assessment Appearance:: Appears stated age Attitude/Demeanor/Rapport: Gracious Affect (typically observed): Accepting Orientation: : [name], Oriented to Place, Oriented to  Time, Oriented to Situation Alcohol  / Substance Use: Not Applicable Psych Involvement: No (comment)  Admission diagnosis:  Incarcerated incisional hernia [K43.0] Patient Active Problem List   Diagnosis Date Noted   Incarcerated incisional hernia 04/10/2024   Irritant contact dermatitis associated with urinary stoma 05/28/2023   Non-recurrent bilateral inguinal hernia without obstruction or gangrene 05/28/2023   Complication of urostomy (HCC) 05/28/2023   Lower urinary tract infectious disease    Prolonged QT interval 12/07/2018   Hydronephrosis 12/07/2018   Pyelonephritis 06/15/2018   Leukocytosis 06/15/2018   Hyperkalemia 06/15/2018   AKI (acute kidney injury) (HCC) 06/15/2018   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   Candida infection    Postoperative intra-abdominal abscess (HCC)    Abscess    Fever 10/09/2017   Bacteremia due to Klebsiella pneumoniae 10/09/2017   Urine leakage from surgical incision    Sepsis (HCC) 10/04/2017   GERD (gastroesophageal reflux disease) 08/11/2017   Obesity (BMI 30-39.9)  08/11/2017   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 08/08/2017 08/10/2017   S/P ileal conduit (HCC) 08/08/2017   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 08/08/2017 08/08/2017   PCP:  Dudley Ghee, MD Pharmacy:   [facility] [phone] - [place], [place] -  [address] AT [facility] [address] [facility] [address] [place] [address] Phone: [phone] Fax: [phone]     Social Drivers of Health (SDOH) Social History: SDOH Screenings   Food Insecurity: No Food Insecurity (04/12/2024)  Housing: Low Risk  (04/12/2024)  Transportation Needs: No Transportation Needs (04/12/2024)  Utilities: Not At Risk (04/12/2024)  Social Connections: Socially Integrated (04/10/2024)  Tobacco Use: Medium Risk (04/10/2024)   SDOH Interventions: Food Insecurity Interventions: Intervention Not Indicated, Inpatient TOC Housing Interventions: Intervention Not Indicated, Inpatient TOC Transportation Interventions: Intervention Not Indicated, Inpatient TOC Utilities Interventions: Intervention Not Indicated, Inpatient TOC   Readmission Risk Interventions    04/12/2024    4:58 PM  Readmission Risk Prevention Plan  Transportation Screening Complete  PCP or Specialist Appt within 3-5 Days Complete  HRI or Home Care Consult Complete  Social Work Consult for Recovery Care Planning/Counseling Complete  Palliative Care Screening Not Applicable  Medication Review Oceanographer) Complete

## 2024-04-12 NOTE — Progress Notes (Signed)
   04/12/24 2326  BiPAP/CPAP/SIPAP  BiPAP/CPAP/SIPAP Pt Type Adult  BiPAP/CPAP/SIPAP Resmed  Mask Type Nasal mask  Dentures removed? Not applicable  FiO2 (%) 21 %  Patient Home Machine No  Patient Home Mask Yes  Patient Home Tubing Yes  Auto Titrate No  CPAP/SIPAP surface wiped down Yes  Device Plugged into RED Power Outlet Yes  BiPAP/CPAP /SiPAP Vitals  Pulse Rate (!) 101  Resp 16  SpO2 94 %  MEWS Score/Color  MEWS Score 1  MEWS Score Color Green

## 2024-04-12 NOTE — Progress Notes (Signed)
 Physical Therapy Treatment Patient Details Name: Kristopher Thompson MRN: [id number] DOB: 11-14-1950 Today's Date: 04/12/2024   History of Present Illness Pt s/p repair of incisional, parastomal, and Left inguinal incarcerated abdominal hernias on 04/10/24 and with hx of CHF, bladder CA, prostate CA, DM, renal disorder, RBBB, and multiple abdominal surgeries    PT Comments  Pt continues cooperative and requiring increased time and significant assist for all tasks - pt largely pain limited.  With improved pain control, pt should progress well to return to PLOF - IND with all mobility and ADL.   If plan is discharge home, recommend the following: A lot of help with walking and/or transfers;A little help with bathing/dressing/bathroom;Assistance with cooking/housework;Assist for transportation;Help with stairs or ramp for entrance   Can travel by private vehicle        Equipment Recommendations  None recommended by PT    Recommendations for Other Services       Precautions / Restrictions Precautions Precautions: Fall;Other (comment) Recall of Precautions/Restrictions: Intact Precaution/Restrictions Comments: bil JP drains, abdominal binder Restrictions Weight Bearing Restrictions Per Provider Order: No     Mobility  Bed Mobility Overal bed mobility: Needs Assistance Bed Mobility: Rolling, Sit to Sidelying Rolling: Mod assist, +2 for physical assistance Sidelying to sit: Mod assist, +2 for physical assistance, +2 for safety/equipment     Sit to sidelying: Mod assist, +2 for safety/equipment General bed mobility comments: cues for technique, use of bed rail, assist for LEs    Transfers Overall transfer level: Needs assistance Equipment used: Rolling walker (2 wheels) Transfers: Sit to/from Stand, Bed to chair/wheelchair/BSC Sit to Stand: Min assist, +2 physical assistance, +2 safety/equipment, From elevated surface   Step pivot transfers: Min assist, +2 physical assistance, +2  safety/equipment, From elevated surface       General transfer comment: cues for use of UEs to self assist.  Physical assist to bring wt up and fwd and to balance in initial standing; step pvt recliner to bedside    Ambulation/Gait Ambulation/Gait assistance: Min assist, +2 safety/equipment Gait Distance (Feet): 3 Feet Assistive device: Rolling walker (2 wheels) Gait Pattern/deviations: Step-to pattern, Decreased step length - right, Decreased step length - left, Shuffle, Trunk flexed Gait velocity: decr     General Gait Details: chair to bed and shuffled along EOB   Stairs             Wheelchair Mobility     Tilt Bed    Modified Rankin (Stroke Patients Only)       Balance Overall balance assessment: Needs assistance Sitting-balance support: No upper extremity supported, Feet supported Sitting balance-Leahy Scale: Fair     Standing balance support: Bilateral upper extremity supported Standing balance-Leahy Scale: Poor                              Communication Communication Communication: No apparent difficulties  Cognition Arousal: Alert Behavior During Therapy: WFL for tasks assessed/performed   PT - Cognitive impairments: No apparent impairments                         Following commands: Intact      Cueing Cueing Techniques: Verbal cues  Exercises      General Comments        Pertinent Vitals/Pain Pain Assessment Pain Assessment: 0-10 Pain Score: 9  (3/10 until standing erect) Pain Location: abdomen Pain Descriptors / Indicators: Grimacing, Guarding, Throbbing Pain  Intervention(s): Limited activity within patient's tolerance, Monitored during session, Patient requesting pain meds-RN notified    Home Living Family/patient expects to be discharged to:: Private residence Living Arrangements: Spouse/significant other Available Help at Discharge: Family;Available 24 hours/day Type of Home: House Home Access: Stairs to  enter   [employer] of Steps: 1   Home Layout: One level Home Equipment: Rollator (4 wheels)      Prior Function            PT Goals (current goals can now be found in the care plan section) Acute Rehab PT Goals Patient Stated Goal: Regain IND PT Goal Formulation: With patient Time For Goal Achievement: 04/26/24 Potential to Achieve Goals: Good Progress towards PT goals: Progressing toward goals    Frequency    Min 3X/week      PT Plan      Co-evaluation              AM-PAC PT "6 Clicks" Mobility   Outcome Measure  Help needed turning from your back to your side while in a flat bed without using bedrails?: A Lot Help needed moving from lying on your back to sitting on the side of a flat bed without using bedrails?: A Lot Help needed moving to and from a bed to a chair (including a wheelchair)?: A Lot Help needed standing up from a chair using your arms (e.g., wheelchair or bedside chair)?: A Lot Help needed to walk in hospital room?: A Lot Help needed climbing 3-5 steps with a railing? : Total 6 Click Score: 11    End of Session Equipment Utilized During Treatment: Gait belt Activity Tolerance: Patient limited by pain Patient left: in bed;with call bell/phone within reach;with nursing/sitter in room;with family/visitor present Nurse Communication: Mobility status;Patient requests pain meds PT Visit Diagnosis: Difficulty in walking, not elsewhere classified (R26.2);Pain Pain - Right/Left:  (abdomen)     Time: 0981-1914 PT Time Calculation (min) (ACUTE ONLY): 10 min  Charges:    $Therapeutic Activity: 8-22 mins PT General Charges $$ ACUTE PT VISIT: 1 Visit                     Thedora Finlay PT Acute Rehabilitation Services Pager [phone] Office [phone]    Kristopher Thompson 04/12/2024, 12:24 PM

## 2024-04-12 NOTE — Progress Notes (Signed)
 2 Days Post-Op   Subjective/Chief Complaint: Patient doing well.  Tolerated liquids. No bowel function. Min mobilization  Objective: Vital signs in last 24 hours: Temp:  [98.1 F (36.7 C)-101.3 F (38.5 C)] 98.8 F (37.1 C) (06/01 0539) Pulse Rate:  [98-127] 117 (06/01 0539) Resp:  [17-18] 17 (06/01 0539) BP: (90-130)/(60-77) 98/72 (06/01 0539) SpO2:  [93 %-100 %] 95 % (06/01 0539) FiO2 (%):  [21 %] 21 % (05/31 2300) Last BM Date : 04/09/24  Intake/Output from previous day: 05/31 0701 - 06/01 0700 In: 1400 [P.O.:1200; I.V.:200] Out: 1145 [Urine:1000; Drains:145] Intake/Output this shift: No intake/output data recorded.  General appearance: alert and cooperative GI: soft, non-tender; bowel sounds normal; no masses,  no organomegaly and inc c/d/I, JP SS  Lab Results:  Recent Labs    04/11/24 0534 04/12/24 0437  WBC 11.1* 17.5*  HGB 12.5* 11.2*  HCT 39.5 34.4*  PLT 188 181   BMET Recent Labs    04/12/24 0437  NA 133*  K 5.1  CL 103  CO2 22  GLUCOSE 163*  BUN 40*  CREATININE 2.08*  CALCIUM  8.5*   PT/INR No results for input(s): "LABPROT", "INR" in the last 72 hours. ABG No results for input(s): "PHART", "HCO3" in the last 72 hours.  Invalid input(s): "PCO2", "PO2"  Studies/Results: No results found.  Anti-infectives: Anti-infectives (From admission, onward)    Start     Dose/Rate Route Frequency Ordered Stop   04/10/24 2200  ceFAZolin  (ANCEF ) IVPB 2g/100 mL premix        2 g 200 mL/hr over 30 Minutes Intravenous Every 8 hours 04/10/24 1833 04/11/24 0531   04/10/24 0600  ceFAZolin  (ANCEF ) IVPB 2g/100 mL premix        2 g 200 mL/hr over 30 Minutes Intravenous On call to O.R. 04/10/24 0533 04/10/24 1526       Assessment/Plan: s/p Procedure(s): REPAIR, HERNIA, PARASTOMAL AND VENTRAL HERNIAS, ROBOT-ASSISTED, LEFT INGUINAL HERNIA, LYSIS OF ADHESIONS INCARCERATED AND INSIONAL HERNIAS AND UROSTOMY REVISION (N/A) REPAIR, HERNIA, VENTRAL  (N/A) POD#2 -starting heart healthy today,  -mobilize -pulm toilet  LOS: 2 days    Kristopher Thompson 04/12/2024

## 2024-04-13 ENCOUNTER — Inpatient Hospital Stay (HOSPITAL_COMMUNITY)

## 2024-04-13 ENCOUNTER — Other Ambulatory Visit: Payer: Self-pay

## 2024-04-13 ENCOUNTER — Encounter (HOSPITAL_COMMUNITY): Payer: Self-pay | Admitting: Surgery

## 2024-04-13 DIAGNOSIS — R6521 Severe sepsis with septic shock: Secondary | ICD-10-CM | POA: Diagnosis not present

## 2024-04-13 DIAGNOSIS — E872 Acidosis, unspecified: Secondary | ICD-10-CM | POA: Diagnosis present

## 2024-04-13 DIAGNOSIS — R0682 Tachypnea, not elsewhere classified: Secondary | ICD-10-CM | POA: Diagnosis present

## 2024-04-13 DIAGNOSIS — J9601 Acute respiratory failure with hypoxia: Secondary | ICD-10-CM | POA: Diagnosis not present

## 2024-04-13 DIAGNOSIS — K430 Incisional hernia with obstruction, without gangrene: Secondary | ICD-10-CM | POA: Diagnosis not present

## 2024-04-13 DIAGNOSIS — A419 Sepsis, unspecified organism: Secondary | ICD-10-CM | POA: Diagnosis present

## 2024-04-13 DIAGNOSIS — J9589 Other postprocedural complications and disorders of respiratory system, not elsewhere classified: Secondary | ICD-10-CM | POA: Diagnosis present

## 2024-04-13 DIAGNOSIS — E66812 Obesity, class 2: Secondary | ICD-10-CM | POA: Diagnosis present

## 2024-04-13 DIAGNOSIS — R000 Tachycardia, unspecified: Secondary | ICD-10-CM | POA: Diagnosis present

## 2024-04-13 LAB — CBC
HCT: 38.2 % — ABNORMAL LOW (ref 39.0–52.0)
Hemoglobin: 12.1 g/dL — ABNORMAL LOW (ref 13.0–17.0)
MCH: 28.6 pg (ref 26.0–34.0)
MCHC: 31.7 g/dL (ref 30.0–36.0)
MCV: 90.3 fL (ref 80.0–100.0)
Platelets: 166 10*3/uL (ref 150–400)
RBC: 4.23 MIL/uL (ref 4.22–5.81)
RDW: 14 % (ref 11.5–15.5)
WBC: 13.3 10*3/uL — ABNORMAL HIGH (ref 4.0–10.5)
nRBC: 0 % (ref 0.0–0.2)

## 2024-04-13 LAB — BLOOD GAS, ARTERIAL
Acid-base deficit: 3.8 mmol/L — ABNORMAL HIGH (ref 0.0–2.0)
Acid-base deficit: 6.1 mmol/L — ABNORMAL HIGH (ref 0.0–2.0)
Bicarbonate: 17.9 mmol/L — ABNORMAL LOW (ref 20.0–28.0)
Bicarbonate: 16.7 mmol/L — ABNORMAL LOW (ref 20.0–28.0)
Drawn by: 72709
Drawn by: 25770
FIO2: 21 %
O2 Saturation: 100 %
O2 Saturation: 100 %
Patient temperature: 39.7
Patient temperature: 37.3
pCO2 arterial: 26 mmHg — ABNORMAL LOW (ref 32–48)
pCO2 arterial: 24 mmHg — ABNORMAL LOW (ref 32–48)
pH, Arterial: 7.46 — ABNORMAL HIGH (ref 7.35–7.45)
pH, Arterial: 7.44 (ref 7.35–7.45)
pO2, Arterial: 111 mmHg — ABNORMAL HIGH (ref 83–108)
pO2, Arterial: 118 mmHg — ABNORMAL HIGH (ref 83–108)

## 2024-04-13 LAB — URINALYSIS, ROUTINE W REFLEX MICROSCOPIC
Bacteria, UA: NONE SEEN
Bilirubin Urine: NEGATIVE
Glucose, UA: NEGATIVE mg/dL
Ketones, ur: 5 mg/dL — AB
Nitrite: NEGATIVE
Protein, ur: 100 mg/dL — AB
RBC / HPF: >50 RBC/hpf (ref 0–5)
Specific Gravity, Urine: 1.016 (ref 1.005–1.030)
WBC, UA: >50 WBC/hpf (ref 0–5)
pH: 6 (ref 5.0–8.0)

## 2024-04-13 LAB — BASIC METABOLIC PANEL WITH GFR
Anion gap: 12 (ref 5–15)
BUN: 40 mg/dL — ABNORMAL HIGH (ref 8–23)
CO2: 19 mmol/L — ABNORMAL LOW (ref 22–32)
Calcium: 8.9 mg/dL (ref 8.9–10.3)
Chloride: 102 mmol/L (ref 98–111)
Creatinine, Ser: 1.65 mg/dL — ABNORMAL HIGH (ref 0.61–1.24)
GFR, Estimated: 44 mL/min — ABNORMAL LOW (ref >60)
Glucose, Bld: 140 mg/dL — ABNORMAL HIGH (ref 70–99)
Potassium: 4.9 mmol/L (ref 3.5–5.1)
Sodium: 133 mmol/L — ABNORMAL LOW (ref 135–145)

## 2024-04-13 LAB — LACTIC ACID, PLASMA
Lactic Acid, Venous: 3.8 mmol/L (ref 0.5–1.9)
Lactic Acid, Venous: 5.1 mmol/L (ref 0.5–1.9)
Lactic Acid, Venous: 5.4 mmol/L (ref 0.5–1.9)

## 2024-04-13 LAB — COMPREHENSIVE METABOLIC PANEL WITH GFR
ALT: 8 U/L (ref 0–44)
ALT: 9 U/L (ref 0–44)
AST: 25 U/L (ref 15–41)
AST: 29 U/L (ref 15–41)
Albumin: 2.6 g/dL — ABNORMAL LOW (ref 3.5–5.0)
Albumin: 2.7 g/dL — ABNORMAL LOW (ref 3.5–5.0)
Alkaline Phosphatase: 50 U/L (ref 38–126)
Alkaline Phosphatase: 41 U/L (ref 38–126)
Anion gap: 13 (ref 5–15)
Anion gap: 11 (ref 5–15)
BUN: 50 mg/dL — ABNORMAL HIGH (ref 8–23)
BUN: 54 mg/dL — ABNORMAL HIGH (ref 8–23)
CO2: 19 mmol/L — ABNORMAL LOW (ref 22–32)
CO2: 19 mmol/L — ABNORMAL LOW (ref 22–32)
Calcium: 8.9 mg/dL (ref 8.9–10.3)
Calcium: 8.4 mg/dL — ABNORMAL LOW (ref 8.9–10.3)
Chloride: 102 mmol/L (ref 98–111)
Chloride: 103 mmol/L (ref 98–111)
Creatinine, Ser: 1.99 mg/dL — ABNORMAL HIGH (ref 0.61–1.24)
Creatinine, Ser: 1.91 mg/dL — ABNORMAL HIGH (ref 0.61–1.24)
GFR, Estimated: 35 mL/min — ABNORMAL LOW (ref >60)
GFR, Estimated: 37 mL/min — ABNORMAL LOW (ref >60)
Glucose, Bld: 137 mg/dL — ABNORMAL HIGH (ref 70–99)
Glucose, Bld: 129 mg/dL — ABNORMAL HIGH (ref 70–99)
Potassium: 4.5 mmol/L (ref 3.5–5.1)
Potassium: 3.8 mmol/L (ref 3.5–5.1)
Sodium: 134 mmol/L — ABNORMAL LOW (ref 135–145)
Sodium: 133 mmol/L — ABNORMAL LOW (ref 135–145)
Total Bilirubin: 1 mg/dL (ref 0.0–1.2)
Total Bilirubin: 1.6 mg/dL — ABNORMAL HIGH (ref 0.0–1.2)
Total Protein: 5.6 g/dL — ABNORMAL LOW (ref 6.5–8.1)
Total Protein: 5.4 g/dL — ABNORMAL LOW (ref 6.5–8.1)

## 2024-04-13 LAB — CBC WITH DIFFERENTIAL/PLATELET
Abs Immature Granulocytes: 0.6 10*3/uL — ABNORMAL HIGH (ref 0.00–0.07)
Band Neutrophils: 10 %
Basophils Absolute: 0 10*3/uL (ref 0.0–0.1)
Basophils Relative: 0 %
Eosinophils Absolute: 0 10*3/uL (ref 0.0–0.5)
Eosinophils Relative: 0 %
HCT: 26.4 % — ABNORMAL LOW (ref 39.0–52.0)
Hemoglobin: 8.5 g/dL — ABNORMAL LOW (ref 13.0–17.0)
Lymphocytes Relative: 7 %
Lymphs Abs: 0.8 10*3/uL (ref 0.7–4.0)
MCH: 28 pg (ref 26.0–34.0)
MCHC: 32.2 g/dL (ref 30.0–36.0)
MCV: 86.8 fL (ref 80.0–100.0)
Metamyelocytes Relative: 6 %
Monocytes Absolute: 0.9 10*3/uL (ref 0.1–1.0)
Monocytes Relative: 8 %
Neutro Abs: 8.5 10*3/uL — ABNORMAL HIGH (ref 1.7–7.7)
Neutrophils Relative %: 69 %
Platelets: 152 10*3/uL (ref 150–400)
RBC: 3.04 MIL/uL — ABNORMAL LOW (ref 4.22–5.81)
RDW: 14.1 % (ref 11.5–15.5)
WBC: 10.8 10*3/uL — ABNORMAL HIGH (ref 4.0–10.5)
nRBC: 0 % (ref 0.0–0.2)

## 2024-04-13 LAB — TROPONIN I (HIGH SENSITIVITY)
Troponin I (High Sensitivity): 38 ng/L — ABNORMAL HIGH (ref <18)
Troponin I (High Sensitivity): 33 ng/L — ABNORMAL HIGH (ref <18)

## 2024-04-13 LAB — APTT: aPTT: 47 s — ABNORMAL HIGH (ref 24–36)

## 2024-04-13 LAB — GLUCOSE, CAPILLARY
Glucose-Capillary: 171 mg/dL — ABNORMAL HIGH (ref 70–99)
Glucose-Capillary: 154 mg/dL — ABNORMAL HIGH (ref 70–99)
Glucose-Capillary: 107 mg/dL — ABNORMAL HIGH (ref 70–99)
Glucose-Capillary: 98 mg/dL (ref 70–99)

## 2024-04-13 LAB — PROTIME-INR
INR: 1.6 — ABNORMAL HIGH (ref 0.8–1.2)
Prothrombin Time: 19.2 s — ABNORMAL HIGH (ref 11.4–15.2)

## 2024-04-13 LAB — BRAIN NATRIURETIC PEPTIDE: B Natriuretic Peptide: 127.6 pg/mL — ABNORMAL HIGH (ref 0.0–100.0)

## 2024-04-13 LAB — MRSA NEXT GEN BY PCR, NASAL: MRSA by PCR Next Gen: NOT DETECTED

## 2024-04-13 MED ORDER — INSULIN ASPART 100 UNIT/ML IJ SOLN
0.0000 [IU] | Freq: Four times a day (QID) | INTRAMUSCULAR | Status: DC
  Administered 2024-04-14 – 2024-04-16 (×6): 2 [IU] via SUBCUTANEOUS
  Administered 2024-04-16 (×3): 3 [IU] via SUBCUTANEOUS
  Administered 2024-04-16: 2 [IU] via SUBCUTANEOUS
  Administered 2024-04-17: 3 [IU] via SUBCUTANEOUS
  Administered 2024-04-17 (×2): 2 [IU] via SUBCUTANEOUS
  Administered 2024-04-18: 3 [IU] via SUBCUTANEOUS
  Administered 2024-04-18 – 2024-04-19 (×6): 2 [IU] via SUBCUTANEOUS

## 2024-04-13 MED ORDER — METOPROLOL TARTRATE 5 MG/5ML IV SOLN: 5.0000 mg | Freq: Once | INTRAVENOUS | Status: AC

## 2024-04-13 MED ORDER — TRAVASOL 10 % IV SOLN
INTRAVENOUS | Status: AC
  Filled 2024-04-13: qty 480

## 2024-04-13 MED ORDER — THIAMINE HCL 100 MG/ML IJ SOLN
100.0000 mg | INTRAMUSCULAR | Status: AC
  Administered 2024-04-14 – 2024-04-18 (×5): 100 mg via INTRAVENOUS
  Filled 2024-04-13 (×5): qty 2

## 2024-04-13 MED ORDER — FENTANYL CITRATE PF 50 MCG/ML IJ SOSY
12.5000 ug | PREFILLED_SYRINGE | INTRAMUSCULAR | Status: DC | PRN
  Administered 2024-04-13 – 2024-04-15 (×3): 25 ug via INTRAVENOUS
  Administered 2024-04-19: 12.5 ug via INTRAVENOUS
  Filled 2024-04-13 (×4): qty 1

## 2024-04-13 MED ORDER — CHLORHEXIDINE GLUCONATE CLOTH 2 % EX PADS
6.0000 | MEDICATED_PAD | Freq: Every day | CUTANEOUS | Status: DC
  Administered 2024-04-13 – 2024-04-19 (×7): 6 via TOPICAL

## 2024-04-13 MED ORDER — SODIUM CHLORIDE 0.9 % IV SOLN
2.0000 g | Freq: Two times a day (BID) | INTRAVENOUS | Status: DC
  Administered 2024-04-13 – 2024-04-14 (×2): 2 g via INTRAVENOUS
  Filled 2024-04-13 (×2): qty 12.5

## 2024-04-13 MED ORDER — SODIUM CHLORIDE 0.9 % IV SOLN: INTRAVENOUS | Status: DC | PRN

## 2024-04-13 MED ORDER — BISACODYL 10 MG RE SUPP
10.0000 mg | Freq: Every day | RECTAL | Status: DC
  Administered 2024-04-13 – 2024-04-14 (×2): 10 mg via RECTAL
  Filled 2024-04-13 (×2): qty 1

## 2024-04-13 MED ORDER — METRONIDAZOLE 500 MG/100ML IV SOLN
500.0000 mg | Freq: Two times a day (BID) | INTRAVENOUS | Status: DC
Start: 2024-04-13 — End: 2024-04-13

## 2024-04-13 MED ORDER — LACTATED RINGERS IV BOLUS
1000.0000 mL | Freq: Once | INTRAVENOUS | Status: AC
  Administered 2024-04-13: 1000 mL via INTRAVENOUS

## 2024-04-13 MED ORDER — SODIUM CHLORIDE 0.9 % IV SOLN: INTRAVENOUS | Status: DC

## 2024-04-13 MED ORDER — NOREPINEPHRINE 4 MG/250ML-% IV SOLN
INTRAVENOUS | Status: AC
  Filled 2024-04-13: qty 250

## 2024-04-13 MED ORDER — THIAMINE HCL 100 MG/ML IJ SOLN
100.0000 mg | Freq: Once | INTRAMUSCULAR | Status: AC
Start: 2024-04-13 — End: 2024-04-13
  Administered 2024-04-13: 100 mg via INTRAVENOUS
  Filled 2024-04-13: qty 2

## 2024-04-13 MED ORDER — SODIUM CHLORIDE 0.9 % IV SOLN
2.0000 g | Freq: Two times a day (BID) | INTRAVENOUS | Status: DC
  Filled 2024-04-13: qty 12.5

## 2024-04-13 MED ORDER — SODIUM CHLORIDE 0.9% FLUSH
10.0000 mL | Freq: Two times a day (BID) | INTRAVENOUS | Status: DC
  Administered 2024-04-13: 10 mL
  Administered 2024-04-13: 30 mL
  Administered 2024-04-14: 20 mL
  Administered 2024-04-14: 30 mL
  Administered 2024-04-15: 40 mL
  Administered 2024-04-15: 20 mL
  Administered 2024-04-16: 30 mL
  Administered 2024-04-16: 20 mL
  Administered 2024-04-17: 10 mL
  Administered 2024-04-17: 30 mL

## 2024-04-13 MED ORDER — SODIUM CHLORIDE 0.9 % IV SOLN: 2.0000 g | Freq: Two times a day (BID) | INTRAVENOUS | Status: DC

## 2024-04-13 MED ORDER — METOPROLOL TARTRATE 5 MG/5ML IV SOLN
5.0000 mg | Freq: Four times a day (QID) | INTRAVENOUS | Status: DC
  Administered 2024-04-13: 5 mg via INTRAVENOUS
  Filled 2024-04-13 (×2): qty 5

## 2024-04-13 MED ORDER — VANCOMYCIN HCL IN DEXTROSE 1-5 GM/200ML-% IV SOLN: 1000.0000 mg | INTRAVENOUS | Status: DC

## 2024-04-13 MED ORDER — ALBUMIN HUMAN 25 % IV SOLN
50.0000 g | Freq: Once | INTRAVENOUS | Status: AC
  Administered 2024-04-13: 50 g via INTRAVENOUS
  Filled 2024-04-13: qty 200

## 2024-04-13 MED ORDER — VANCOMYCIN HCL IN DEXTROSE 1-5 GM/200ML-% IV SOLN: 1000.0000 mg | Freq: Once | INTRAVENOUS | Status: DC

## 2024-04-13 MED ORDER — METHOCARBAMOL 1000 MG/10ML IJ SOLN
500.0000 mg | Freq: Four times a day (QID) | INTRAMUSCULAR | Status: DC | PRN
  Filled 2024-04-13 (×2): qty 10

## 2024-04-13 MED ORDER — VANCOMYCIN HCL 10 G IV SOLR
2000.0000 mg | Freq: Once | INTRAVENOUS | Status: AC
  Administered 2024-04-13: 2000 mg via INTRAVENOUS
  Filled 2024-04-13: qty 40

## 2024-04-13 MED ORDER — VANCOMYCIN HCL 10 G IV SOLR: 2000.0000 mg | Freq: Once | INTRAVENOUS | Status: DC

## 2024-04-13 MED ORDER — LACTATED RINGERS IV SOLN: INTRAVENOUS | Status: DC

## 2024-04-13 MED ORDER — METOPROLOL TARTRATE 5 MG/5ML IV SOLN
5.0000 mg | Freq: Once | INTRAVENOUS | Status: AC
  Administered 2024-04-13: 5 mg via INTRAVENOUS

## 2024-04-13 MED ORDER — METOCLOPRAMIDE HCL 5 MG/ML IJ SOLN
5.0000 mg | Freq: Three times a day (TID) | INTRAMUSCULAR | Status: DC
  Administered 2024-04-13: 5 mg via INTRAVENOUS
  Filled 2024-04-13 (×2): qty 2

## 2024-04-13 MED ORDER — NOREPINEPHRINE 4 MG/250ML-% IV SOLN
0.0000 ug/min | INTRAVENOUS | Status: DC
  Administered 2024-04-13: 24 ug/min via INTRAVENOUS
  Administered 2024-04-13: 2 ug/min via INTRAVENOUS
  Administered 2024-04-13: 24 ug/min via INTRAVENOUS
  Administered 2024-04-14: 11 ug/min via INTRAVENOUS
  Administered 2024-04-14: 6 ug/min via INTRAVENOUS
  Filled 2024-04-13 (×5): qty 250

## 2024-04-13 MED ORDER — METOPROLOL TARTRATE 5 MG/5ML IV SOLN: 5.0000 mg | Freq: Four times a day (QID) | INTRAVENOUS | Status: DC | PRN

## 2024-04-13 MED ORDER — SODIUM CHLORIDE 0.9 % IV SOLN
2.0000 g | INTRAVENOUS | Status: AC
  Administered 2024-04-13: 2 g via INTRAVENOUS
  Filled 2024-04-13: qty 12.5

## 2024-04-13 MED ORDER — SODIUM CHLORIDE 0.9% FLUSH: 10.0000 mL | INTRAVENOUS | Status: DC | PRN

## 2024-04-13 MED ORDER — LACTATED RINGERS IV BOLUS
1000.0000 mL | Freq: Three times a day (TID) | INTRAVENOUS | Status: AC | PRN
  Administered 2024-04-13: 1000 mL via INTRAVENOUS

## 2024-04-13 MED ORDER — METRONIDAZOLE 500 MG/100ML IV SOLN
500.0000 mg | Freq: Two times a day (BID) | INTRAVENOUS | Status: DC
  Administered 2024-04-13 – 2024-04-17 (×8): 500 mg via INTRAVENOUS
  Filled 2024-04-13 (×8): qty 100

## 2024-04-13 MED ORDER — SODIUM CHLORIDE 0.9 % IV SOLN: INTRAVENOUS | Status: AC

## 2024-04-13 NOTE — Progress Notes (Signed)
 Critical lactic acid reported by lab of 3.8. Paged Gross MD.

## 2024-04-13 NOTE — Progress Notes (Signed)
 Initial Nutrition Assessment  INTERVENTION:   Monitor magnesium , potassium, and phosphorus for at least 3 days, MD to replete as needed, as pt is at risk for refeeding syndrome. -Continue 100 mg Thiamine daily for at least 5 days  -TPN management per Pharmacy -Daily weights while on TPN  NUTRITION DIAGNOSIS:   Increased nutrient needs related to acute illness as evidenced by estimated needs.  GOAL:   Patient will meet greater than or equal to 90% of their needs  MONITOR:    (TPN)  REASON FOR ASSESSMENT:   Consult New TPN/TNA  ASSESSMENT:   73 y.o male patient with history of Parastomal hernia of ileal conduit. On 5/30 underwent hernia repair with urostomy revision.  Patient transferred to stepdown and having PICC placed during attempted visit. Will speak with patient at a later time. Pt had NGT placed today given pt was having bilious vomit after consuming full liquids. Has been unable to have diet advanced since admission 5/30 following hernia repair surgery. Now with post-op ileus.   Per weight records, pt needs updated weight for admission. Weight has been trending down since 2022.  TPN starting tonight, at 40 ml/hr, providing 921 kcals and 48g protein.  Medications: Dulcolax, Tums, Thiamine, Lactated ringers , Levophed  Labs reviewed: CBGs: 107-154 Low Na   NUTRITION - FOCUSED PHYSICAL EXAM:  Unable to complete   Diet Order:   Diet Order             Diet NPO time specified Except for: Ice Chips  Diet effective now                   EDUCATION NEEDS:   Not appropriate for education at this time  Skin:  Skin Assessment: Skin Integrity Issues: Skin Integrity Issues:: Incisions Incisions: 5/30 abdomen  Last BM:  5/29  Height:   Ht Readings from Last 1 Encounters:  04/10/24 5\' 11"  (1.803 m)    Weight:   Wt Readings from Last 1 Encounters:  04/10/24 108.9 kg    BMI:  Body mass index is 33.47 kg/m.  Estimated Nutritional Needs:    Kcal:  1950-2150  Protein:  95-110g  Fluid:  2.1L/day  Arna Better, MS, RD, LDN Inpatient Clinical Dietitian Contact via Secure chat

## 2024-04-13 NOTE — Progress Notes (Signed)
 Pharmacy Antibiotic Note  Kristopher Thompson is a 73 y.o. male who is known to pharmacy from TPN consult. Pharmacy was consulted earlier today(04/10/24) by CCS to dose  cefepime , vancomycin  and cefepime  for sepsis. Dr. [name] came by later and d/ced abx.  CCS now wants start start abx for patient. Per [place] msg with Dr. Gaynell Keeler, start vancomycin , cefepime  and flagyl  instead of vancomycin /zosyn .    Plan: - vancomycin  2000 mg IV x1, then 1000 mg IV q24h for war AUC 532 - cefepime  2gm IV q12h - flagyl  500 mg q12h  ________________________________  Height: 5\' 11"  (180.3 cm) Weight: 108.9 kg (240 lb) IBW/kg (Calculated) : 75.3  Temp (24hrs), Avg:100 F (37.8 C), Min:98.2 F (36.8 C), Max:103.5 F (39.7 C)  Recent Labs  Lab 04/11/24 0534 04/12/24 0437 04/13/24 0421 04/13/24 0713 04/13/24 1047  WBC 11.1* 17.5* 13.3*  --   --   CREATININE  --  2.08* 1.65*  --  1.99*  LATICACIDVEN  --   --   --  3.8* 5.1*    Estimated Creatinine Clearance: 42.7 mL/min (A) (by C-G formula based on SCr of 1.99 mg/dL (H)).    Allergies  Allergen Reactions   Demerol  [Meperidine ] Nausea And Vomiting and Other (See Comments)    SEVERE NAUSEA     Thank you for allowing pharmacy to be a part of this patient's care.  Kristopher Thompson 04/13/2024 2:01 PM

## 2024-04-13 NOTE — Procedures (Signed)
 Arterial Catheter Insertion Procedure Note  Kristopher Thompson  [id number]  09-12-1951  Date:04/13/24  Time:5:59 PM    Provider Performing: Delories Fetter    Procedure: Insertion of Arterial Line (40981) with US  guidance (19147)   Indication(s) Blood pressure monitoring and/or need for frequent ABGs  Consent Risks of the procedure as well as the alternatives and risks of each were explained to the patient and/or caregiver.  Consent for the procedure was obtained and is signed in the bedside chart  Anesthesia Local anesthetic    Time Out Verified patient identification, verified procedure, site/side was marked, verified correct patient position, special equipment/implants available, medications/allergies/relevant history reviewed, required imaging and test results available.   Sterile Technique Maximal sterile technique including full sterile barrier drape, hand hygiene, sterile gown, sterile gloves, mask, hair covering, sterile ultrasound probe cover (if used).   Procedure Description Area of catheter insertion was cleaned with chlorhexidine  and draped in sterile fashion. With real-time ultrasound guidance an arterial catheter was placed into the left femoral artery.  Appropriate arterial tracings confirmed on monitor.     Complications/Tolerance None; patient tolerated the procedure well.   EBL Minimal   Specimen(s) None    Eston Hence MSN, AGACNP-BC [facility] Pulmonary/Critical Care Medicine Amion for pager  04/13/2024, 5:59 PM

## 2024-04-13 NOTE — Progress Notes (Signed)
 eLink Physician-Brief Progress Note Patient Name: RYNELL CIOTTI DOB: 12-23-1950 MRN: [id number]   Date of Service  04/13/2024  HPI/Events of Note  Concerned that the patient's hemoglobin dropped from 12.1-8.5 without any active signs of bleeding.  Remaining labs are fairly unremarkable  eICU Interventions  Will recheck lactic acid in the morning.  Maintain current therapy including ordered LR.  Follow-up labs with a.m. unless there is no significant change in hemodynamics.     Intervention Category Minor Interventions: Clinical assessment - ordering diagnostic tests  Kellene Mccleary 04/13/2024, 8:48 PM

## 2024-04-13 NOTE — Progress Notes (Signed)
 PT Cancellation Note  Patient Details Name: MARCELLES CLINARD MRN: [id number] DOB: 05-28-1951   Cancelled Treatment:    Reason Eval/Treat Not Completed: Patient declined, no reason specified (Rapid called for tachypnea, thacycardia, and pt febrile. Transferred to ICU. Will follow up at later date/time as pt able and schedule allows.)   Corbin Dess PT, DPT Acute Rehabilitation Services Office [phone]  04/13/24 12:52 PM

## 2024-04-13 NOTE — Consult Note (Signed)
 Medical Consultation   Kristopher Thompson  ZHY:[id number]  DOB: [birth year]  DOA: 04/10/2024  PCP: Dudley Ghee, MD  Requesting physician: Candyce Champagne, MD.  Reason for consultation: Sepsis.   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 Started on TPN due to ileus.  Active Problems:   Sepsis due to undetermined organism [facility]) Associated with:   Sinus tachycardia   Tachypnea   Acute respiratory insufficiency, postoperative And:   Lactic acidosis Sepsis protocol started. Given IV fluids boluses and albumin . Cefepime , metronidazole  and vancomycin  per pharmacy. Follow-up blood culture and sensitivity. Has been worsening clinically so PCCM consulted.    AKI (acute kidney injury) (HCC) Continue IV fluids. Avoid hypotension. Avoid nephrotoxins. Monitor intake and output. Monitor renal function electrolytes.    Class 2 obesity Current BMI 33.47 kg/m. Would benefit from lifestyle modifications. Follow-up closely with PCP.   History of Present Illness: Kristopher Thompson is an 73 y.o. male multiple atypical nevus, history of diastolic heart failure, GERD, hiatal hernia, prostate cancer, bladder cancer, right bundle branch block, history of sepsis due to Klebsiella pneumonia bacteremia secondary to postop intra-abdominal abscess who underwent robotic lysis of adhesions, robotic repair subincisional, parastomal, left inguinal incarcerated abdominal wall hernias with mesh, wound ostomy ileal conduit revision and intraoperative assessment of tissue vascular perfusion on postop day #2 who we are asked to consult due to fever, nausea, vomiting and lactic acidosis.  The patient has been persistent tachypneic and tachycardic since earlier today.  He was transferred to the stepdown unit for further management.   I ordered IV fluid boluses, albumin  and antibiotics per sepsis protocol.  Once transferred to the stepdown area, the patient started having rigors, became more tachycardic and tachypneic.  We also had difficulty trying to obtain his blood pressure.  His lactic acid rose to 5.1 mmol/L.  PCCM consulted.  Past 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   moderate atypia - right low back   Atypical nevus 04/04/2007   moderate to marked - right upper back (wider shave)   Atypical nevus 04/04/2007   moderate atypia - center chest (wider shave)   Atypical nevus 04/04/2007   slight atypia - right thigh   Atypical nevus 11/29/2011   mild atypia - center upper back   Atypical nevus 11/29/2011   mild atypia - center chest   Bladder cancer [facility]) dx 07/2017   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  s/p  cystectomy w/ ileal conduit urinary diversion   CHF (congestive heart failure) (HCC)    Colostomy in place (HCC)    since 08-08-2017-- per pt 12-25-2017 reddness around stoma   Diabetes mellitus without complication (HCC)    GERD (gastroesophageal reflux disease)    H/O hiatal hernia    History of sepsis 09/2017   dx bacteremia due to klebsiella pneumoniae,  post op intraabdominal abscess   Prostate cancer [facility]) urologist-- dr Rozanne Corners   10-02-2012 s/p  prostatectomy-- Stage T1c   RBBB    Renal disorder    pt. denies   Sleep apnea    cpap   Squamous cell carcinoma of  skin 05/22/2013   left cheek - CX3 + 5FU   Wears glasses     Past Surgical History: Past Surgical History:  Procedure Laterality Date   ABDOMINAL SURGERY     APPENDECTOMY  1972   CHOLECYSTECTOMY  1985   COLONOSCOPY N/A 06/29/2021   Procedure: COLONOSCOPY;  Surgeon: Joyce Nixon, MD;  Location: [facility];  Service: Endoscopy;  Laterality: N/A;   COLOSTOMY REVERSAL N/A 01/08/2018   Procedure:  COLOSTOMY REVERSAL;  Surgeon: Joyce Nixon, MD;  Location: [facility];  Service: General;  Laterality: N/A;   CYSTOSCOPY WITH RETROGRADE PYELOGRAM, URETEROSCOPY AND STENT PLACEMENT Right 06/10/2017   Procedure: CYSTOSCOPY WITH RIGHT URETEROSCOPY WITH RIGHT STENT PLACEMENT;  Surgeon: Florencio Hunting, MD;  Location: [facility];  Service: Urology;  Laterality: Right;   EUS N/A 04/16/2018   Procedure: FULL UPPER ENDOSCOPIC ULTRASOUND (EUS) RADIAL;  Surgeon: Evangeline Hilts, MD;  Location: [facility];  Service: Endoscopy;  Laterality: N/A;   FLEXIBLE SIGMOIDOSCOPY N/A 12/13/2017   Procedure: FLEXIBLE SIGMOIDOSCOPY;  Surgeon: Joyce Nixon, MD;  [facility];  Service: Endoscopy;  Laterality: N/A;   ILEO CONDUIT     IR CATHETER TUBE CHANGE  12/13/2017   IR CATHETER TUBE CHANGE  01/17/2018   IR CATHETER TUBE CHANGE  02/28/2018   IR CATHETER TUBE CHANGE  07/18/2018   IR CATHETER TUBE CHANGE  08/22/2018   IR CATHETER TUBE CHANGE  10/31/2018   IR CONVERT LEFT NEPHROSTOMY TO NEPHROURETERAL CATH  10/24/2017   IR EXT NEPHROURETERAL CATH EXCHANGE  05/19/2018   IR EXT NEPHROURETERAL CATH EXCHANGE  06/16/2018   IR NEPHRO TUBE REMOV/FL  10/24/2017   IR NEPHROSTOGRAM LEFT THRU EXISTING ACCESS  12/05/2018   IR NEPHROSTOMY PLACEMENT LEFT  10/07/2017   IR NEPHROSTOMY TUBE CHANGE  04/11/2018   POLYPECTOMY  06/29/2021   Procedure: POLYPECTOMY;  Surgeon: Joyce Nixon, MD;  [facility];  Service: Endoscopy;;   ROBOT ASSISTED LAPAROSCOPIC RADICAL PROSTATECTOMY  10/02/2012   Procedure: ROBOTIC ASSISTED LAPAROSCOPIC RADICAL PROSTATECTOMY LEVEL 2;  Surgeon: [name], MD;  Location[facility];  Service: Urology;  Laterality: N/A;   ROBOTIC ASSISTED LAPAROSCOPIC BLADDER DIVERTICULECTOMY N/A 08/08/2017   Procedure: XI ROBOTIC ASSISTED LAPAROSCOPIC RADICAL CYSTECTOMY COVERTED TO OPEN PELVIC LYMPHADNECTOMY BILATERAL AND ILEAL CONDUIT URINARY DIVERSION;  Surgeon: Florencio Hunting, MD;  Location: [facility];  Service: Urology;   Laterality: N/A;   TRANSURETHRAL RESECTION OF BLADDER TUMOR  06/10/2017   Procedure: TRANSURETHRAL RESECTION OF BLADDER TUMOR (TURBT);  Surgeon: Florencio Hunting, MD;  Location[facility];  Service: Urology;;   VENTRAL HERNIA REPAIR N/A 04/10/2024   Procedure: REPAIR, HERNIA, VENTRAL;  Surgeon: Candyce Champagne, MD;  Location[facility];  Service: General;  Laterality: N/A;   XI ROBOT ABDOMINAL PERINEAL RESECTION N/A 08/08/2017   Procedure: REPAIR OF RECTAL TEAR POSSIBLE PARTIAL PROCTECTOMY, CREATION OF  OSTOMY;  Surgeon: Joyce Nixon, MD;  [facility];  Service: General;  Laterality: N/A;   XI ROBOTIC ASSISTED PARASTOMAL HERNIA REPAIR N/A 04/10/2024   Procedure: REPAIR, HERNIA, PARASTOMAL AND VENTRAL HERNIAS, ROBOT-ASSISTED, LEFT INGUINAL HERNIA, LYSIS OF ADHESIONS INCARCERATED AND INSIONAL HERNIAS AND UROSTOMY REVISION;  Surgeon: Candyce Champagne, MD;  Location[facility];  Service: General;  Laterality: N/A;     Allergies:   Allergies  Allergen Reactions   Demerol  [Meperidine ] Nausea And Vomiting and Other (See Comments)    SEVERE NAUSEA     Social History:  reports that he quit smoking about 44 years ago. His smoking use included cigarettes. He started smoking  about 53 years ago. He has never used smokeless tobacco. He reports current alcohol  use. He reports that he does not use drugs.   Family History: Family History  Problem Relation Age of Onset   Lung cancer Mother    Hypertension Father    Colon cancer Other    CAD Neg Hx    Diabetes Neg Hx    Stroke Neg Hx     Physical Exam: Vitals:   04/13/24 0800 04/13/24 0834 04/13/24 0931 04/13/24 1037  BP:  (!) 90/53 (!) 94/55 (!) 106/59  Pulse: (!) 116 (!) 114 (!) 114 96  Resp:  (!) 24 20   Temp:  (!) 101.8 F (38.8 C)    TempSrc:  Rectal    SpO2:  98% 97%   Weight:      Height:        Physical Exam Vitals and nursing note reviewed.  Constitutional:      General: He is awake. He is not in acute distress.    Appearance: He is  obese. He is ill-appearing.     Comments: NGT in place.  HENT:     Head: Normocephalic.     Nose: No rhinorrhea.     Mouth/Throat:     Mouth: Mucous membranes are dry.  Eyes:     General: No scleral icterus.    Pupils: Pupils are equal, round, and reactive to light.  Neck:     Vascular: No JVD.  Cardiovascular:     Rate and Rhythm: Regular rhythm. Tachycardia present.     Heart sounds: S1 normal and S2 normal.  Pulmonary:     Breath sounds: No wheezing, rhonchi or rales.  Abdominal:     General: The ostomy site is clean. Bowel sounds are absent. There is no distension.     Palpations: Abdomen is soft.     Tenderness: There is abdominal tenderness. There is no right CVA tenderness, left CVA tenderness or guarding.  Musculoskeletal:     Cervical back: Neck supple.     Right lower leg: No edema.     Left lower leg: No edema.  Skin:    General: Skin is warm and dry.  Neurological:     General: No focal deficit present.     Mental Status: He is alert and oriented to person, place, and time.  Psychiatric:        Mood and Affect: Mood normal.        Behavior: Behavior normal. Behavior is cooperative.      Data reviewed:  I have personally reviewed following labs and imaging studies Labs:  CBC: Recent Labs  Lab 04/11/24 0534 04/12/24 0437 04/13/24 0421  WBC 11.1* 17.5* 13.3*  HGB 12.5* 11.2* 12.1*  HCT 39.5 34.4* 38.2*  MCV 89.8 86.4 90.3  PLT 188 181 166   Basic Metabolic Panel: Recent Labs  Lab 04/12/24 0437 04/13/24 0421 04/13/24 1047  NA 133* 133* 134*  K 5.1 4.9 4.5  CL 103 102 102  CO2 22 19* 19*  GLUCOSE 163* 140* 137*  BUN 40* 40* 50*  CREATININE 2.08* 1.65* 1.99*  CALCIUM  8.5* 8.9 8.9  MG 2.1  --   --   PHOS 2.7  --   --    GFR Estimated Creatinine Clearance: 42.7 mL/min (A) (by C-G formula based on SCr of 1.99 mg/dL (H)). Liver Function Tests: Recent Labs  Lab 04/13/24 1047  AST 25  ALT 8  ALKPHOS 50  BILITOT 1.0  PROT 5.6*  ALBUMIN   2.6*   No results for input(s): "LIPASE", "AMYLASE" in the last 168 hours. No results for input(s): "AMMONIA" in the last 168 hours. Coagulation profile Recent Labs  Lab 04/13/24 1047  INR 1.6*   Cardiac Enzymes: No results for input(s): "CKTOTAL", "CKMB", "CKMBINDEX", "TROPONINI" in the last 168 hours. BNP: Invalid input(s): "POCBNP" CBG: Recent Labs  Lab 04/12/24 1609 04/12/24 2146 04/13/24 0610 04/13/24 0755 04/13/24 1159  GLUCAP 104* 153* 171* 154* 107*   D-Dimer No results for input(s): "DDIMER" in the last 72 hours. Hgb A1c No results for input(s): "HGBA1C" in the last 72 hours. Lipid Profile No results for input(s): "CHOL", "HDL", "LDLCALC", "TRIG", "CHOLHDL", "LDLDIRECT" in the last 72 hours. Thyroid  function studies No results for input(s): "TSH", "T4TOTAL", "T3FREE", "THYROIDAB" in the last 72 hours.  Invalid input(s): "FREET3" Anemia work up No results for input(s): "VITAMINB12", "FOLATE", "FERRITIN", "TIBC", "IRON", "RETICCTPCT" in the last 72 hours. Urinalysis    Component Value Date/Time   COLORURINE YELLOW 04/13/2024 1018   APPEARANCEUR CLOUDY (A) 04/13/2024 1018   LABSPEC 1.016 04/13/2024 1018   PHURINE 6.0 04/13/2024 1018   GLUCOSEU NEGATIVE 04/13/2024 1018   HGBUR LARGE (A) 04/13/2024 1018   BILIRUBINUR NEGATIVE 04/13/2024 1018   KETONESUR 5 (A) 04/13/2024 1018   PROTEINUR 100 (A) 04/13/2024 1018   NITRITE NEGATIVE 04/13/2024 1018   LEUKOCYTESUR LARGE (A) 04/13/2024 1018   Sepsis Labs Recent Labs  Lab 04/11/24 0534 04/12/24 0437 04/13/24 0421  WBC 11.1* 17.5* 13.3*   Microbiology No results found for this or any previous visit (from the past 240 hours).  Inpatient Medications:   Scheduled Meds:  bisacodyl   10 mg Rectal Daily   calcium  carbonate  1 tablet Oral BID WC   Chlorhexidine  Gluconate Cloth  6 each Topical Daily   enoxaparin  (LOVENOX ) injection  40 mg Subcutaneous Q24H   insulin  aspart  0-15 Units Subcutaneous Q6H    metoCLOPramide  (REGLAN ) injection  5 mg Intravenous Q8H   metoprolol  tartrate  5 mg Intravenous Q6H   multivitamin with minerals  1 tablet Oral QODAY   sodium chloride  flush  3 mL Intravenous Q12H   thiamine (VITAMIN B1) injection  100 mg Intravenous Once   Followed by   [START ON 04/14/2024] thiamine (VITAMIN B1) injection  100 mg Intravenous Q24H   Continuous Infusions:  sodium chloride      sodium chloride  100 mL/hr at 04/13/24 1036   albumin  human     lactated ringers      TPN ADULT (ION)     Radiological Exams on Admission: DG Abd Portable 1V Result Date: 04/13/2024 CLINICAL DATA:  Tachypnea EXAM: PORTABLE ABDOMEN - 1 VIEW COMPARISON:  None Available. FINDINGS: Multiple dilated centrally located small bowel loops in the mid abdomen could correlate with early or partial small bowel obstruction. Drain catheter peritoneal catheter in the left lower quadrant region of the abdomen. Subcutaneous air and edema in the left side of the abdominal wall. IMPRESSION: Multiple dilated centrally located small bowel loops in the mid abdomen could correlate with early or partial small bowel obstruction. Electronically Signed   By: Fredrich Jefferson M.D.   On: 04/13/2024 08:19   DG Abd Portable 1V Result Date: 04/13/2024 CLINICAL DATA:  NG tube placement EXAM: PORTABLE ABDOMEN - 1 VIEW COMPARISON:  None Available. FINDINGS: the tip of the nasogastric tube is located within the body fundus stomach in good position. IMPRESSION: Enteric tube in the stomach. Electronically Signed   By: Evorn Holder.D.  On: 04/13/2024 08:15   US  EKG SITE RITE Result Date: 04/13/2024 If Site Rite image not attached, placement could not be confirmed due to current cardiac rhythm.  DG CHEST PORT 1 VIEW Result Date: 04/13/2024 CLINICAL DATA:  Tachypnea EXAM: PORTABLE CHEST 1 VIEW COMPARISON:  12/06/2018 FINDINGS: The cardiomediastinal contours are unremarkable. Lung volumes are low. There are small bilateral pleural effusions. Mild  subsegmental atelectasis within the lung bases. No consolidative change. IMPRESSION: Low lung volumes and small bilateral pleural effusions. Electronically Signed   By: Kimberley Penman M.D.   On: 04/13/2024 07:23   EKG: Vent. rate 142 BPM PR interval 120 ms QRS duration 128 ms QT/QTcB 326/501 ms P-R-T axes * -57 10 Sinus tachycardia Right bundle branch block Left anterior fascicular block Bifascicular block Cannot rule out Inferior infarct (masked by fascicular block?) , age undetermined Anterolateral infarct , age undetermined  Thank you for this consultation.  Our [facility] hospitalist team will follow the patient with you.  Time Spent: Over 90 minutes were spent during the process of this consult. Danice Dural M.D. [facility] 04/13/2024, 12:12 PM  This document was prepared using Dragon voice recognition software and may contain some unintended transcription errors.

## 2024-04-13 NOTE — Progress Notes (Addendum)
 Concern with recurrent hypotension.  NG tube placed with 800 mL returned.  NG tube in good placement.  No free air on KUB.  Tilated SB loops c/w ileus.  X-ray shows no aspiration pneumonia.  No major hypoxia but keep on oxygen.  Both the drains in his abdominal wall hernia sacs above the mesh and drain intraperitoneal below the mesh and on the bowel looks serosanguineous and not concerning.  Argues against delayed leak.  Abdomen flat without peritonitis at this point.  Lactate ordered per rapid response.  Mildly elevated.  He does not have any evidence of any peritonitis or leak or abscess at this time so I am skeptical what to do with that except for some resuscitation.  No strong evidence of infection = no antibiotics for now unless not responsive to volume  Will transfer to stepdown unit.  Ask internal medicine to see.  Help follow-up.

## 2024-04-13 NOTE — Significant Event (Signed)
 Rapid Response Event Note   Reason for Call :  Tachypnea and tachycardia, vomited once overnight (unable to calculate amount).  Initial Focused Assessment:  Patient alert/oriented x4. Patient denies pain or feeling short of breath. Oral temp 99.5 F Hot to touch. Surgical sites unremarkable. JP drain right side reportedly draining significant amount overnight. Urine output adequate. Bedside RN and NT to document amounts. Rectal temp 103.5 F Lung sounds clear, but breathing 32 respirations/minute. Apical pulse 137. BP unremarkable at 112/66. EKG: SINUS TACHYCARDIA CBG 171 Routine am labs already completed (BMP, CBC). Last dose scheduled Tylenol  given at 0511.  Interventions:  Assessment as above. Coordinated care with Dr. Enid Harry. Labs ordered: urine and blood cultures, UA, lactic acid, ABG, CBG. Imaging ordered and done: KUB, CXR. All done with consent of patient and wife.  Plan of Care:  Possible change in level of care dependent on attending MD evaluation and results of diagnostic testing.  Event Summary:   MD Notified: Enid Harry, MD Call Time: [phone] Arrival Time: 0600 End Time: [id number]  Hobson Luna, RN

## 2024-04-13 NOTE — Progress Notes (Addendum)
 Note: Portions of this report may have been transcribed using voice recognition software. A sincere effort was made to ensure accuracy; however, inadvertent computerized transcription errors may be present.   Any transcriptional errors that result from this process are unintentional.        Kristopher Thompson  12-17-1950 [id number]  Patient Care Team: Dudley Ghee, MD as PCP - General (Internal Medicine) Devon Fogo, MD (Inactive) as Consulting Physician (Dermatology) Lavina Pou, MD as Referring Physician (Pulmonary Disease) Candyce Champagne, MD as Consulting Physician (General Surgery) Florencio Hunting, MD as Consulting Physician (Urology)  Patient in ICU.  Requiring Levophed.  Family (wife and son) at bedside.  Patient with some mild abdominal discomfort but no worsening pain.  NG tube to suction.  Output has come down.  Patient feels like he may need to have bowel movement but not yet.  Examination he looks tired but not in acute distress.  His abdomen is mildly distended.  Mild discomfort on port sites only.  No diffuse peritonitis.  Some ecchymosis along right flank.  Urostomy pink with clear light orange urine in bag.  Drains remain serosanguineous.  NG tube flushed with moderately thick green bilious effluent.  No pain or peritonitis to bed shake percussion cough or flushing.  Echocardiogram done but not read yet.  Persistent shock of uncertain etiology.  Continue antibiotics for now.  Rebolus IV fluids.  Starting TPN tonight to avoid malnutrition possible.  It is a little soon to get a CAT scan since it will not look well defined, but I think we are obligated to get one.  See if we can give him enteral contrast to make sure there is no evidence of any bowel leak.  If there is he would need operative reexploration, removal of mesh.  Small bowel resection/repair.  Hopefully try to avoid ostomy.  Hard to say.  Make sure that he does not have any recurrent trapped  hernias.    CT chest r/o PE/PNA  Discussion made with patient, his wife and son at bedside, ICU nurse, critical care MD and NP.  D/w Dr Eli Grizzle on [facility] call tonight to help follow  Patient Active Problem List   Diagnosis Date Noted   Sinus tachycardia 04/13/2024   Tachypnea 04/13/2024   Acute respiratory insufficiency, postoperative 04/13/2024   Sepsis due to undetermined organism (HCC) 04/13/2024   Lactic acidosis 04/13/2024   Class 2 obesity 04/13/2024   Incarcerated incisional hernia 04/10/2024   Irritant contact dermatitis associated with urinary stoma 05/28/2023   Non-recurrent bilateral inguinal hernia without obstruction or gangrene 05/28/2023   Complication of urostomy (HCC) 05/28/2023   Lower urinary tract infectious disease    Prolonged QT interval 12/07/2018   Hydronephrosis 12/07/2018   Pyelonephritis 06/15/2018   Leukocytosis 06/15/2018   Hyperkalemia 06/15/2018   AKI (acute kidney injury) (HCC) 06/15/2018   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   Candida infection    Postoperative intra-abdominal abscess (HCC)    Abscess    Fever 10/09/2017   Bacteremia due to Klebsiella pneumoniae 10/09/2017   Urine leakage from surgical incision    Sepsis (HCC) 10/04/2017   GERD (gastroesophageal reflux disease) 08/11/2017   Obesity (BMI 30-39.9) 08/11/2017   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 08/08/2017 08/10/2017   S/P ileal conduit (HCC) 08/08/2017   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 08/08/2017 08/08/2017    Past Medical History:  Diagnosis Date   At risk for sleep apnea    12-25-2017   STOP-BANG SCORE= 5   ---  SENT TO PCP   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   moderate atypia - right low back   Atypical nevus 04/04/2007   moderate to marked - right upper back (wider shave)   Atypical nevus 04/04/2007   moderate atypia - center chest (wider shave)   Atypical nevus 04/04/2007   slight atypia - right thigh   Atypical nevus 11/29/2011   mild atypia -  center upper back   Atypical nevus 11/29/2011   mild atypia - center chest   Bladder cancer [facility]) dx 07/2017   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  s/p  cystectomy w/ ileal conduit urinary diversion   CHF (congestive heart failure) (HCC)    Colostomy in place [facility])    since 08-08-2017-- per pt 12-25-2017 reddness around stoma   Diabetes mellitus without complication (HCC)    GERD (gastroesophageal reflux disease)    H/O hiatal hernia    History of sepsis 09/2017   dx bacteremia due to klebsiella pneumoniae,  post op intraabdominal abscess   Prostate cancer [facility]) urologist-- dr Rozanne Corners   10-02-2012 s/p  prostatectomy-- Stage T1c   RBBB    Renal disorder    pt. denies   Sleep apnea    cpap   Squamous cell carcinoma of skin 05/22/2013   left cheek - CX3 + 5FU   Wears glasses     Past Surgical History:  Procedure Laterality Date   ABDOMINAL SURGERY     APPENDECTOMY  1972   CHOLECYSTECTOMY  1985   COLONOSCOPY N/A 06/29/2021   Procedure: COLONOSCOPY;  Surgeon: Joyce Nixon, MD;  Location: [facility];  Service: Endoscopy;  Laterality: N/A;   COLOSTOMY REVERSAL N/A 01/08/2018   Procedure: COLOSTOMY REVERSAL;  Surgeon: Joyce Nixon, MD;  Location: [facility];  Service: General;  Laterality: N/A;   CYSTOSCOPY WITH RETROGRADE PYELOGRAM, URETEROSCOPY AND STENT PLACEMENT Right 06/10/2017   Procedure: CYSTOSCOPY WITH RIGHT URETEROSCOPY WITH RIGHT STENT PLACEMENT;  Surgeon: Florencio Hunting, MD;  Location: [facility];  Service: Urology;  Laterality: Right;   EUS N/A 04/16/2018   Procedure: FULL UPPER ENDOSCOPIC ULTRASOUND (EUS) RADIAL;  Surgeon: Evangeline Hilts, MD;  Location: [facility];  Service: Endoscopy;  Laterality: N/A;   FLEXIBLE SIGMOIDOSCOPY N/A 12/13/2017   Procedure: FLEXIBLE SIGMOIDOSCOPY;  Surgeon: Joyce Nixon, MD;  [facility];  Service: Endoscopy;  Laterality: N/A;   ILEO CONDUIT     IR CATHETER TUBE CHANGE  12/13/2017   IR CATHETER TUBE CHANGE  01/17/2018   IR  CATHETER TUBE CHANGE  02/28/2018   IR CATHETER TUBE CHANGE  07/18/2018   IR CATHETER TUBE CHANGE  08/22/2018   IR CATHETER TUBE CHANGE  10/31/2018   IR CONVERT LEFT NEPHROSTOMY TO NEPHROURETERAL CATH  10/24/2017   IR EXT NEPHROURETERAL CATH EXCHANGE  05/19/2018   IR EXT NEPHROURETERAL CATH EXCHANGE  06/16/2018   IR NEPHRO TUBE REMOV/FL  10/24/2017   IR NEPHROSTOGRAM LEFT THRU EXISTING ACCESS  12/05/2018   IR NEPHROSTOMY PLACEMENT LEFT  10/07/2017   IR NEPHROSTOMY TUBE CHANGE  04/11/2018   POLYPECTOMY  06/29/2021   Procedure: POLYPECTOMY;  Surgeon: Joyce Nixon, MD;  [facility];  Service: Endoscopy;;   ROBOT ASSISTED LAPAROSCOPIC RADICAL PROSTATECTOMY  10/02/2012   Procedure: ROBOTIC ASSISTED LAPAROSCOPIC RADICAL PROSTATECTOMY LEVEL 2;  Surgeon: [name], MD;  Location: [facility];  Service: Urology;  Laterality: N/A;   ROBOTIC ASSISTED LAPAROSCOPIC BLADDER DIVERTICULECTOMY N/A 08/08/2017   Procedure: XI ROBOTIC ASSISTED LAPAROSCOPIC RADICAL CYSTECTOMY COVERTED TO OPEN PELVIC LYMPHADNECTOMY BILATERAL AND ILEAL CONDUIT URINARY  DIVERSION;  Surgeon: Florencio Hunting, MD;  Location: [facility];  Service: Urology;  Laterality: N/A;   TRANSURETHRAL RESECTION OF BLADDER TUMOR  06/10/2017   Procedure: TRANSURETHRAL RESECTION OF BLADDER TUMOR (TURBT);  Surgeon: Florencio Hunting, MD;  [facility];  Service: Urology;;   VENTRAL HERNIA REPAIR N/A 04/10/2024   Procedure: REPAIR, HERNIA, VENTRAL;  Surgeon: Candyce Champagne, MD;  [facility];  Service: General;  Laterality: N/A;   XI ROBOT ABDOMINAL PERINEAL RESECTION N/A 08/08/2017   Procedure: REPAIR OF RECTAL TEAR POSSIBLE PARTIAL PROCTECTOMY, CREATION OF  OSTOMY;  Surgeon: Joyce Nixon, MD;  [facility];  Service: General;  Laterality: N/A;   XI ROBOTIC ASSISTED PARASTOMAL HERNIA REPAIR N/A 04/10/2024   Procedure: REPAIR, HERNIA, PARASTOMAL AND VENTRAL HERNIAS, ROBOT-ASSISTED, LEFT INGUINAL HERNIA, LYSIS OF ADHESIONS INCARCERATED AND INSIONAL HERNIAS  AND UROSTOMY REVISION;  Surgeon: Candyce Champagne, MD;  Location: [facility];  Service: General;  Laterality: N/A;    Social History   Socioeconomic History   Marital status: Married    Spouse name: Not on file   Number of children: Not on file   Years of education: Not on file   Highest education level: Not on file  Occupational History   Not on file  Tobacco Use   Smoking status: Former    Current packs/day: 0.00    Types: Cigarettes    Start date: 12/24/1970    Quit date: 12/25/1979    Years since quitting: 44.3   Smokeless tobacco: Never  Vaping Use   Vaping status: Never Used  Substance and Sexual Activity   Alcohol  use: Yes    Comment: rare   Drug use: No   Sexual activity: Yes  Other Topics Concern   Not on file  Social History Narrative   Not on file   Social Drivers of Health   Financial Resource Strain: Not on file  Food Insecurity: No Food Insecurity (04/12/2024)   Hunger Vital Sign    Worried About Running Out of Food in the Last Year: Never true    Ran Out of Food in the Last Year: Never true  Transportation Needs: No Transportation Needs (04/12/2024)   PRAPARE - Administrator, Civil Service (Medical): No    Lack of Transportation (Non-Medical): No  Physical Activity: Not on file  Stress: Not on file  Social Connections: Socially Integrated (04/10/2024)   Social Connection and Isolation Panel [NHANES]    Frequency of Communication with Friends and Family: More than three times a week    Frequency of Social Gatherings with Friends and Family: Three times a week    Attends Religious Services: More than 4 times per year    Active Member of Clubs or Organizations: Yes    Attends Banker Meetings: 1 to 4 times per year    Marital Status: Married  Catering manager Violence: Not At Risk (04/12/2024)   Humiliation, Afraid, Rape, and Kick questionnaire    Fear of Current or Ex-Partner: No    Emotionally Abused: No    Physically Abused: No     Sexually Abused: No    Family History  Problem Relation Age of Onset   Lung cancer Mother    Hypertension Father    Colon cancer Other    CAD Neg Hx    Diabetes Neg Hx    Stroke Neg Hx     Current Facility-Administered Medications  Medication Dose Route Frequency Provider Last Rate Last Admin   0.9 %  sodium chloride  infusion  250 mL Intravenous PRN Candyce Champagne, MD       0.9 %  sodium chloride  infusion   Intravenous Continuous Candyce Champagne, MD 150 mL/hr at 04/13/24 1651 Infusion Verify at 04/13/24 1651   0.9 %  sodium chloride  infusion   Intra-arterial PRN Olalere, Adewale A, MD       alum & mag hydroxide-simeth (MAALOX/MYLANTA) 200-200-20 MG/5ML suspension 30 mL  30 mL Oral Q6H PRN Candyce Champagne, MD   30 mL at 04/12/24 2026   bisacodyl  (DULCOLAX) suppository 10 mg  10 mg Rectal Daily Candyce Champagne, MD   10 mg at 04/13/24 1610   calcium  carbonate (TUMS - dosed in mg elemental calcium ) chewable tablet 200 mg of elemental calcium   1 tablet Oral BID WC Candyce Champagne, MD   200 mg of elemental calcium  at 04/12/24 1437   ceFEPIme  (MAXIPIME ) 2 g in sodium chloride  0.9 % 100 mL IVPB  2 g Intravenous Q12H Pham, Anh P, RPH       Chlorhexidine  Gluconate Cloth 2 % PADS 6 each  6 each Topical Daily Candyce Champagne, MD   6 each at 04/13/24 1205   diphenhydrAMINE  (BENADRYL ) 12.5 MG/5ML elixir 12.5 mg  12.5 mg Oral Q6H PRN Candyce Champagne, MD       enoxaparin  (LOVENOX ) injection 40 mg  40 mg Subcutaneous Q24H Candyce Champagne, MD   40 mg at 04/13/24 9604   fentaNYL  (SUBLIMAZE ) injection 12.5-25 mcg  12.5-25 mcg Intravenous Q2H PRN Candyce Champagne, MD       insulin  aspart (novoLOG ) injection 0-15 Units  0-15 Units Subcutaneous Q6H Candyce Champagne, MD       ipratropium (ATROVENT ) 0.03 % nasal spray 1 spray  1 spray Each Nare TID PRN Candyce Champagne, MD       lactated ringers  bolus 1,000 mL  1,000 mL Intravenous Q8H PRN Candyce Champagne, MD 999 mL/hr at 04/13/24 1651 Infusion Verify at 04/13/24 1651   lactated ringers   bolus 1,000 mL  1,000 mL Intravenous Once Candyce Champagne, MD       magic mouthwash  15 mL Oral QID PRN Candyce Champagne, MD       magnesium  hydroxide (MILK OF MAGNESIA) suspension 30 mL  30 mL Oral Daily PRN Candyce Champagne, MD       menthol -cetylpyridinium (CEPACOL) lozenge 3 mg  1 lozenge Oral PRN Candyce Champagne, MD       methocarbamol  (ROBAXIN ) injection 500 mg  500 mg Intravenous Q6H PRN Candyce Champagne, MD       metoCLOPramide  (REGLAN ) injection 5-10 mg  5-10 mg Intravenous Q8H PRN Candyce Champagne, MD       metoprolol  tartrate (LOPRESSOR ) injection 5 mg  5 mg Intravenous Q6H PRN Candyce Champagne, MD       metroNIDAZOLE  (FLAGYL ) IVPB 500 mg  500 mg Intravenous Q12H Olalere, Adewale A, MD   Stopped at 04/13/24 1617   naphazoline-glycerin  (CLEAR EYES REDNESS) ophth solution 1-2 drop  1-2 drop Both Eyes QID PRN Candyce Champagne, MD       norepinephrine (LEVOPHED) 4mg  in (0.016 mg/mL) premix infusion  0-40 mcg/min Intravenous Titrated Olalere, Adewale A, MD 90 mL/hr at 04/13/24 1730 24 mcg/min at 04/13/24 1730   phenol (CHLORASEPTIC) mouth spray 2 spray  2 spray Mouth/Throat PRN Candyce Champagne, MD       prochlorperazine  (COMPAZINE ) injection 5-10 mg  5-10 mg Intravenous Q6H PRN Candyce Champagne, MD   5 mg at 04/13/24 0510   simethicone  (MYLICON) chewable tablet 40 mg  40 mg Oral Q6H PRN Candyce Champagne, MD   40 mg at 04/12/24 0403   sodium chloride  (OCEAN) 0.65 % nasal spray 1-2 spray  1-2 spray Each Nare Q6H PRN Candyce Champagne, MD       sodium chloride  flush (NS) 0.9 % injection 10-40 mL  10-40 mL Intracatheter Q12H Kalliopi Coupland, MD   30 mL at 04/13/24 1517   sodium chloride  flush (NS) 0.9 % injection 10-40 mL  10-40 mL Intracatheter PRN Candyce Champagne, MD       sodium chloride  flush (NS) 0.9 % injection 3 mL  3 mL Intravenous Q12H Gianlucas Evenson, MD   3 mL at 04/12/24 2241   sodium chloride  flush (NS) 0.9 % injection 3 mL  3 mL Intravenous PRN Candyce Champagne, MD       Cecily Cohen ON 04/14/2024] thiamine (VITAMIN B1)  injection 100 mg  100 mg Intravenous Q24H Candyce Champagne, MD       TPN ADULT (ION)   Intravenous Continuous TPN Candyce Champagne, MD       vancomycin  (VANCOCIN ) 2,000 mg in sodium chloride  0.9 % 500 mL IVPB  2,000 mg Intravenous Once Shade, Christine E, RPH 260 mL/hr at 04/13/24 1651 Infusion Verify at 04/13/24 1651   [START ON 04/14/2024] vancomycin  (VANCOCIN ) IVPB 1000 mg/200 mL premix  1,000 mg Intravenous Q24H Olalere, Adewale A, MD         Allergies  Allergen Reactions   Demerol  [Meperidine ] Nausea And Vomiting and Other (See Comments)    SEVERE NAUSEA    BP (!) 97/57   Pulse (!) 116   Temp 99.7 F (37.6 C) (Axillary)   Resp (!) 24   Ht 5\' 11"  (1.803 m)   Wt 108.9 kg   SpO2 97%   BMI 33.47 kg/m   DG Abd Portable 1V Result Date: 04/13/2024 CLINICAL DATA:  Tachypnea EXAM: PORTABLE ABDOMEN - 1 VIEW COMPARISON:  None Available. FINDINGS: Multiple dilated centrally located small bowel loops in the mid abdomen could correlate with early or partial small bowel obstruction. Drain catheter peritoneal catheter in the left lower quadrant region of the abdomen. Subcutaneous air and edema in the left side of the abdominal wall. IMPRESSION: Multiple dilated centrally located small bowel loops in the mid abdomen could correlate with early or partial small bowel obstruction. Electronically Signed   By: Fredrich Jefferson M.D.   On: 04/13/2024 08:19   DG Abd Portable 1V Result Date: 04/13/2024 CLINICAL DATA:  NG tube placement EXAM: PORTABLE ABDOMEN - 1 VIEW COMPARISON:  None Available. FINDINGS: the tip of the nasogastric tube is located within the body fundus stomach in good position. IMPRESSION: Enteric tube in the stomach. Electronically Signed   By: Fredrich Jefferson M.D.   On: 04/13/2024 08:15   US  EKG SITE RITE Result Date: 04/13/2024 If Site Rite image not attached, placement could not be confirmed due to current cardiac rhythm.  DG CHEST PORT 1 VIEW Result Date: 04/13/2024 CLINICAL DATA:  Tachypnea EXAM:  PORTABLE CHEST 1 VIEW COMPARISON:  12/06/2018 FINDINGS: The cardiomediastinal contours are unremarkable. Lung volumes are low. There are small bilateral pleural effusions. Mild subsegmental atelectasis within the lung bases. No consolidative change. IMPRESSION: Low lung volumes and small bilateral pleural effusions. Electronically Signed   By: Kimberley Penman M.D.   On: 04/13/2024 07:23

## 2024-04-13 NOTE — Plan of Care (Signed)

## 2024-04-13 NOTE — Progress Notes (Addendum)
 04/13/2024  Kristopher Thompson [id number] [birth year]  CARE TEAM: PCP: Dudley Ghee, MD  Outpatient Care Team: Patient Care Team: Dudley Ghee, MD as PCP - General (Internal Medicine) Devon Fogo, MD (Inactive) as Consulting Physician (Dermatology) Lavina Pou, MD as Referring Physician (Pulmonary Disease) Candyce Champagne, MD as Consulting Physician (General Surgery) Florencio Hunting, MD as Consulting Physician (Urology)  Inpatient Treatment Team: Treatment Team:  Candyce Champagne, MD Bagtas, Karilyn Ouch, RN Wright, [name] E, LPN [facility], NT [facility], Md, MD Shireen Dory, [facility]   Problem List:   Principal Problem:   Incarcerated incisional hernia   04/10/2024  POST-OPERATIVE DIAGNOSIS:  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  Largest hernia was in the right lower quadrant around the ileal conduit urostomy.  16 x 12 cm region.  Incarcerated with over a foot of small bowel.  Midline next largest 9 x 8 cm incarcerated with numerous loops of small bowel.  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  Another direct space hernia in the left lower quadrant and indirect hernia on the left lower quadrant.  No obvious hernias on the right side.   Component separation's done on both sides to try and get the largest midline and parastomal hernias close down with running serrated try to fix absorbable suture.  Broad  underlay repair with 30 x 35 cm mesh transverse  Assessment [facility] Stay = 3 days) 3 Days Post-Op    Worsening ileus    Plan:  Make NPO.  NG tube with repeated bilious emesis.  Suspect prolonged ileus since still obstipated.  Start TPN and PICC line to avoid malnutrition.  Low threshold to transfer in stepdown unit if does not turn around.  He feels better after vomiting again.  Tachycardia looks like sinus rhythm and not atrial fibrillation.  Placed on scheduled metoprolol  since I cannot trust enteral route and regroup.  Low threshold to involve internal medicine.  Some mild AKI resolved with rehydration and nonoliguric.  Monitor  -monitor electrolytes & replace as needed  Keep K>4, Mg>2, Phos>3  Lungs sound clear and he is not frankly hypoxic.  Chest x-ray just shows lung volumes somewhat low but no evidence of aspiration pneumonia/pneumonitis/major infectious concerns.  Will place on 2 L oxygen and order x-ray to rule out aspiration event given a fever spike.  His abdomen is soft with no evidence of any  leak/perforation, nor abscess.  His drains are serosanguineous and underwhelming.  He is not massively distended and he does not have peritonitis.  I doubt he has any intra-abdominal leak, but if it remains a concern but reasonable to consider CAT scan of abdomen pelvis to rule out something if not improved by postop day 4/5 = next couple of days.  -Diabetes.  Sliding scale insulin .  Some mild hyperglycemia but nothing too severe.  Keep slightly elevated/on the sweet side to avoid severe hypoglycemic events.  Low threshold to consult internal medicine if he does not turn around quickly.  -VTE prophylaxis- SCDs.  Anticoagulation  prophyllaxis SQ as appropriate  -mobilize as tolerated to help recovery.  Enlist therapies in moderate/high risk patients as appropriate.  He seems rather tired and deconditioned and is at least a two-person assist.  Hopefully we can get him up to the  chair more to help him.    I updated the patient's status to the the patient, his wife at bedside, nurse in room.  Recommendations were made.  Questions were answered.  They expressed understanding & appreciation.  -Disposition: He is going to be here a while       I reviewed nursing notes, last 24 h vitals and pain scores, last 48 h intake and output, last 24 h labs and trends, and last 24 h imaging results.  I have reviewed this patient's available data, including medical history, events of note, test results, etc as part of my evaluation.   A significant portion of that time was spent in counseling. Care during the described time interval was provided by me.  This care required high  level of medical decision making.  04/13/2024    Subjective: (Chief complaint)  Patient with worsening heartburn reflux.  Preferred to switch to Tums.  Spitting up and felt worse.  Tachycardic with fever spike.  Rapid response called.  Patient spitting up.  Wife in room.  Nurse [name] in room  Patient feeling weak and tired and not wanting to get out of bed.  Had been trying some solid food but burping and belching a lot.  Has not had any flatus or bowel movements.  Objective:  Vital signs:  Vitals:   04/13/24 0503 04/13/24 0559 04/13/24 0623 04/13/24 0629  BP: (!) 143/73 111/72 112/66   Pulse: (!) 139 (!) 143 (!) 140 (!) 137  Resp: (!) 22 (!) 22  (!) 32  Temp: 99.8 F (37.7 C) 99.3 F (37.4 C) 99.5 F (37.5 C) (!) 103.5 F (39.7 C)  TempSrc: Oral Oral Oral Rectal  SpO2:  96%    Weight:      Height:        Last BM Date : 05/10/24  Intake/Output   Yesterday:  06/01 0701 - 06/02 0700 In: 2705.2 [P.O.:960; I.V.:745.2; IV Piggyback:1000] Out: 1880 [Urine:1550; Drains:330] This shift:  No intake/output data recorded.  Bowel function:  Flatus: No  BM:  No  Drains:  RLQ in hernia sac subcutaneous tissues of abdominal wall:  serosanguineous LUQ intraperitoneal / pelvis:   serosanguineous   Physical Exam:  General: Pt awake/alert in no acute distress.  Looks tired but not sickly.  Joking but looks exhausted Eyes: PERRL, normal EOM.  Sclera clear.  No icterus Neuro: CN II-XII intact w/o focal sensory/motor deficits. Lymph: No head/neck/groin lymphadenopathy Psych:  No delerium/psychosis/paranoia.  Oriented x 4.  No confusion. HENT: Normocephalic, Mucus membranes moist.  No thrush Neck: Supple, No tracheal deviation.  No obvious thyromegaly Chest: No pain to chest wall compression.  Good respiratory excursion.  No audible wheezing.  Lungs clear to auscultation without wheezing, rales, nor rhonchi CV:  Pulses intact.  Regular rhythm.  No major extremity edema MS: Normal AROM mjr joints.  No obvious deformity  Abdomen: Soft.  Nondistended.  Mildly tender at incisions only.  No evidence of guarding nor peritonitis.  No incarcerated hernias.   No cellulitis abscess or drainage.  No evidence of any succus or purulence. Right lower quadrant ileal conduit urostomy with some edema but viable with clear lightly yellow-colored urine in bag.   Ext:   No deformity.  No mjr edema.  No cyanosis Skin: No petechiae / purpurea.  No major sores.  Warm and dry    Results:   Cultures: No results found for this or any previous visit (from the past 720 hours).  Labs: Results for orders placed or performed during the hospital encounter of 04/10/24 (from the past 48 hours)  Glucose, capillary     Status: Abnormal   Collection Time: 04/11/24  7:57 AM  Result Value Ref Range   Glucose-Capillary 149 (H) 70 - 99 mg/dL    Comment: Glucose reference range applies only to samples taken after fasting for at least 8 hours.  Glucose, capillary     Status: Abnormal   Collection Time: 04/11/24 11:29 AM  Result Value Ref Range   Glucose-Capillary 145 (H) 70 - 99 mg/dL    Comment: Glucose reference range applies only to samples taken after fasting for at least 8 hours.  Glucose,  capillary     Status: Abnormal   Collection Time: 04/11/24  4:03 PM  Result Value Ref Range   Glucose-Capillary 137 (H) 70 - 99 mg/dL    Comment: Glucose reference range applies only to samples taken after fasting for at least 8 hours.  Glucose, capillary     Status: Abnormal   Collection Time: 04/11/24  9:13 PM  Result Value Ref Range   Glucose-Capillary 177 (H) 70 - 99 mg/dL    Comment: Glucose reference range applies only to samples taken after fasting for at least 8 hours.  CBC     Status: Abnormal   Collection Time: 04/12/24  4:37 AM  Result Value Ref Range   WBC 17.5 (H) 4.0 - 10.5 K/uL   RBC 3.98 (L) 4.22 - 5.81 MIL/uL   Hemoglobin 11.2 (L) 13.0 - 17.0 g/dL   HCT 30.8 (L) 65.7 - 84.6 %   MCV 86.4 80.0 - 100.0 fL   MCH 28.1 26.0 - 34.0 pg   MCHC 32.6 30.0 - 36.0 g/dL   RDW 96.2 95.2 - 84.1 %   Platelets 181 150 - 400 K/uL   nRBC 0.0 0.0 - 0.2 %    Comment: Performed at [facility], [address] [place], [place] [address]  Basic metabolic panel with GFR     Status: Abnormal   Collection Time: 04/12/24  4:37 AM  Result Value Ref Range   Sodium 133 (L) 135 - 145 mmol/L   Potassium 5.1 3.5 - 5.1 mmol/L   Chloride 103 98 - 111 mmol/L   CO2 22 22 - 32 mmol/L   Glucose, Bld 163 (H) 70 - 99 mg/dL    Comment: Glucose reference range applies only to samples taken after fasting for at least 8 hours.   BUN 40 (H) 8 - 23 mg/dL   Creatinine, Ser 1.02 (H) 0.61 - 1.24 mg/dL   Calcium  8.5 (L) 8.9 - 10.3 mg/dL   GFR, Estimated 33 (L) >60 mL/min    Comment: (NOTE) Calculated using the CKD-EPI Creatinine Equation (2021)    Anion gap 8 5 - 15    Comment: Performed at [facility], [address] [place], [place] [address]  Magnesium      Status: None   Collection Time: 04/12/24  4:37 AM  Result Value Ref Range   Magnesium  2.1 1.7 - 2.4 mg/dL    Comment: Performed at [facility], [address], [place], [place] [address]   Phosphorus     Status: None   Collection  Time: 04/12/24  4:37 AM  Result Value Ref Range   Phosphorus 2.7 2.5 - 4.6 mg/dL    Comment: Performed at [facility], [address] [place], [place] [address]  Glucose, capillary     Status: Abnormal   Collection Time: 04/12/24  7:42 AM  Result Value Ref Range   Glucose-Capillary 149 (H) 70 - 99 mg/dL    Comment: Glucose reference range applies only to samples taken after fasting for at least 8 hours.  Glucose, capillary     Status: Abnormal   Collection Time: 04/12/24 11:29 AM  Result Value Ref Range   Glucose-Capillary 163 (H) 70 - 99 mg/dL    Comment: Glucose reference range applies only to samples taken after fasting for at least 8 hours.  Glucose, capillary     Status: Abnormal   Collection Time: 04/12/24  4:09 PM  Result Value Ref Range   Glucose-Capillary 104 (H) 70 - 99 mg/dL    Comment: Glucose reference range applies only to samples taken after fasting for at least 8 hours.  Glucose, capillary     Status: Abnormal   Collection Time: 04/12/24  9:46 PM  Result Value Ref Range   Glucose-Capillary 153 (H) 70 - 99 mg/dL    Comment: Glucose reference range applies only to samples taken after fasting for at least 8 hours.  CBC     Status: Abnormal   Collection Time: 04/13/24  4:21 AM  Result Value Ref Range   WBC 13.3 (H) 4.0 - 10.5 K/uL   RBC 4.23 4.22 - 5.81 MIL/uL   Hemoglobin 12.1 (L) 13.0 - 17.0 g/dL   HCT 60.4 (L) 54.0 - 98.1 %   MCV 90.3 80.0 - 100.0 fL   MCH 28.6 26.0 - 34.0 pg   MCHC 31.7 30.0 - 36.0 g/dL   RDW 19.1 47.8 - 29.5 %   Platelets 166 150 - 400 K/uL   nRBC 0.0 0.0 - 0.2 %    Comment: Performed at [facility], [address], [place], [place] [address]  Basic metabolic panel with GFR     Status: Abnormal   Collection Time: 04/13/24  4:21 AM  Result Value Ref Range   Sodium 133 (L) 135 - 145 mmol/L   Potassium 4.9 3.5 - 5.1 mmol/L   Chloride 102 98 - 111 mmol/L   CO2  19 (L) 22 - 32 mmol/L   Glucose, Bld 140 (H) 70 - 99 mg/dL    Comment: Glucose reference range applies only to samples taken after fasting for at least 8 hours.   BUN 40 (H) 8 - 23 mg/dL   Creatinine, Ser 8.65 (H) 0.61 - 1.24 mg/dL   Calcium  8.9 8.9 - 10.3 mg/dL   GFR, Estimated 44 (L) >60 mL/min    Comment: (NOTE) Calculated using the CKD-EPI Creatinine Equation (2021)    Anion gap 12 5 - 15    Comment: Performed at [facility], [address] [place], [place] [address]  Glucose, capillary     Status: Abnormal   Collection Time: 04/13/24  6:10 AM  Result Value Ref Range   Glucose-Capillary 171 (H) 70 - 99 mg/dL    Comment: Glucose reference range applies only to samples taken after fasting for at least 8 hours.  Blood gas, arterial     Status: Abnormal   Collection Time: 04/13/24  7:01 AM  Result Value Ref Range   FIO2 21 %   Delivery systems ROOM  AIR    pH, Arterial 7.46 (H) 7.35 - 7.45   pCO2 arterial 26 (L) 32 - 48 mmHg   pO2, Arterial 111 (H) 83 - 108 mmHg   Bicarbonate 17.9 (L) 20.0 - 28.0 mmol/L   Acid-base deficit 3.8 (H) 0.0 - 2.0 mmol/L   O2 Saturation 100 %   Patient temperature 39.7    Collection site RIGHT RADIAL    Drawn by [id number]    Allens test (pass/fail) PASS PASS    Comment: Performed at [facility], [address] [place], [place] [address]    Imaging / Studies: No results found.  Medications / Allergies: per chart  Antibiotics: Anti-infectives (From admission, onward)    Start     Dose/Rate Route Frequency Ordered Stop   04/10/24 2200  ceFAZolin  (ANCEF ) IVPB 2g/100 mL premix        2 g 200 mL/hr over 30 Minutes Intravenous Every 8 hours 04/10/24 1833 04/11/24 0531   04/10/24 0600  ceFAZolin  (ANCEF ) IVPB 2g/100 mL premix        2 g 200 mL/hr over 30 Minutes Intravenous On call to O.R. 04/10/24 0533 04/10/24 1526         Note: Portions of this report may have been transcribed using voice recognition  software. Every effort was made to ensure accuracy; however, inadvertent computerized transcription errors may be present.   Any transcriptional errors that result from this process are unintentional.    Eddye Goodie, MD, FACS, MASCRS Esophageal, Gastrointestinal & Colorectal Surgery Robotic and Minimally Invasive Surgery  [facility] [address] [place], [place] [address] [phone] Fax [phone] Main  CONTACT INFORMATION: Weekday (9AM-5PM): Call [facility] main office at [phone] Weeknight (5PM-9AM) or Weekend/Holiday: Check EPIC "Web Links" tab & use "AMION" (password " [email or web address]") for General Surgery [facility] coverage  Please, DO NOT use SecureChat  (it is not reliable communication to reach operating surgeons & will lead to a delay in care).   Epic staff messaging available for outptient concerns needing 1-2 business day response.      04/13/2024  7:24 AM

## 2024-04-13 NOTE — Consult Note (Signed)
 NAME:  Kristopher Thompson, MRN:  [id number], DOB:  1951-04-23, LOS: 3 ADMISSION DATE:  04/10/2024, CONSULTATION DATE:  04/13/2024 REFERRING MD:  Dr Bonita Bussing, CHIEF COMPLAINT: Sepsis  History of Present Illness:  S/p repair of incisional/parastomal and ventral hernias, left inguinal incarcerated , abdominal hernias, lysis of adhesions, urostomy revision Day 1 postoperatively  Transferred to the ICU secondary to tachycardia, tachypnea  History of bladder cancer, prostate cancer, diastolic heart failure, diabetes, right bundle branch block, multiple abdominal surgeries  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   moderate atypia - right low back   Atypical nevus 04/04/2007   moderate to marked - right upper back (wider shave)   Atypical nevus 04/04/2007   moderate atypia - center chest (wider shave)   Atypical nevus 04/04/2007   slight atypia - right thigh   Atypical nevus 11/29/2011   mild atypia - center upper back   Atypical nevus 11/29/2011   mild atypia - center chest   Bladder cancer [facility]) dx 07/2017   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) urologist-- dr Rozanne Corners   10-02-2012 s/p  prostatectomy-- Stage T1c   RBBB    Renal disorder    pt. denies   Sleep apnea    cpap   Squamous cell carcinoma of skin 05/22/2013   left cheek - CX3 + 5FU   Wears glasses    Significant Hospital Events: Including procedures, antibiotic start  and stop dates in addition to other pertinent events   04/10/2024-laparoscopic surgery 04/13/24 CCM consult  Interim History / Subjective:  Awake, alert Tachycardic, tachypneic  Objective    Blood pressure (!) 106/59, pulse 96, temperature (!) 101.8 F (38.8 C), temperature source Rectal, resp. rate 20, height 5\' 11"  (1.803 m), weight 108.9 kg, SpO2 97%.    FiO2 (%):  [21 %] 21 %   Intake/Output Summary (Last 24 hours) at 04/13/2024 1259 Last data filed at 04/13/2024 0941 Gross per 24 hour  Intake 2465.15 ml  Output 2720 ml  Net -254.85 ml   Filed Weights   04/10/24 0536  Weight: 108.9 kg    Examination: General: Elderly, acutely ill-appearing HENT: Moist oral mucosa, nasogastric tube in place Lungs: Fair air entry, no wheezes Cardiovascular: S1-S2 appreciated Abdomen: Soft, hypoactive bowels Extremities: No clubbing, no edema Neuro: Awake alert, oriented to person, place GU: Fair output  I reviewed nursing notes, Consultant notes, hospitalist notes, last 24 h vitals and pain scores, last 48 h intake and output, last 24 h labs and trends, and last 24 h imaging results.  ABG 7.46/26/111  Bicarb of 19, BUN 50, creatinine 1.99 WBC 13.3, hematocrit 38.2 Chest x-ray with hypoventilated field, atelectasis at bases  Resolved problem list   Assessment and Plan   Sepsis - Hypotension, hypoxemia, new fever, increased FiO2 requirement, - Empirically start Zosyn /vancomycin  - Follow-up on cultures-blood,  urine,  -  Increasing lactic acid  Cautious fluid resuscitation Echocardiogram performed, results pending - Does have a history of diastolic dysfunction  Acute hypoxemic respiratory failure - Oxygen requirement being escalated-on 6 L  Acute kidney injury - Maintain renal perfusion - Avoid nephrotoxic medications  Day 2 post robotic lysis of adhesions, repair of incisional parastomal, left inguinal incarcerated abdominal wall hernias with mesh -Serosanguineous drainage  in his drains Ileus  Diabetes - SSI  History of obstructive sleep apnea on CPAP therapy at home Past history of bladder cancer-2018 Past history of prostate cancer-post prostatectomy-2013 History of pulmonary embolism - Lifelong anticoagulation  Best Practice (right click and "Reselect all SmartList Selections" daily)   Diet/type: NPO DVT prophylaxis SCD Pressure ulcer(s): N/A GI prophylaxis: N/A Lines: Central line,PICC Foley:  Yes, and it is still needed Code Status:  full code Last date of multidisciplinary goals of care discussion [discussed with spouse at bedside]  Labs   CBC: Recent Labs  Lab 04/11/24 0534 04/12/24 0437 04/13/24 0421  WBC 11.1* 17.5* 13.3*  HGB 12.5* 11.2* 12.1*  HCT 39.5 34.4* 38.2*  MCV 89.8 86.4 90.3  PLT 188 181 166    Basic Metabolic Panel: Recent Labs  Lab 04/12/24 0437 04/13/24 0421 04/13/24 1047  NA 133* 133* 134*  K 5.1 4.9 4.5  CL 103 102 102  CO2 22 19* 19*  GLUCOSE 163* 140* 137*  BUN 40* 40* 50*  CREATININE 2.08* 1.65* 1.99*  CALCIUM  8.5* 8.9 8.9  MG 2.1  --   --   PHOS 2.7  --   --    GFR: Estimated Creatinine Clearance: 42.7 mL/min (A) (by C-G formula based on SCr of 1.99 mg/dL (H)). Recent Labs  Lab 04/11/24 0534 04/12/24 0437 04/13/24 0421 04/13/24 0713 04/13/24 1047  WBC 11.1* 17.5* 13.3*  --   --   LATICACIDVEN  --   --   --  3.8* 5.1*    Liver Function Tests: Recent Labs  Lab 04/13/24 1047  AST 25  ALT 8  ALKPHOS 50  BILITOT 1.0  PROT 5.6*  ALBUMIN  2.6*   No results for input(s): "LIPASE", "AMYLASE" in the last 168 hours. No results for input(s): "AMMONIA" in the last 168 hours.  ABG    Component Value Date/Time   PHART 7.46 (H) 04/13/2024 0701   PCO2ART 26 (L) 04/13/2024 0701   PO2ART 111 (H) 04/13/2024 0701   HCO3 17.9 (L) 04/13/2024 0701   TCO2 24 08/08/2017 1734   ACIDBASEDEF 3.8 (H) 04/13/2024 0701   O2SAT 100 04/13/2024 0701     Coagulation Profile: Recent Labs  Lab  04/13/24 1047  INR 1.6*    Cardiac Enzymes: No results for input(s): "CKTOTAL", "CKMB", "CKMBINDEX", "TROPONINI" in the last 168 hours.  HbA1C: Hgb A1c MFr Bld  Date/Time Value Ref Range Status  04/01/2024 10:50 AM 5.3 4.8 - 5.6 % Final    Comment:    (NOTE) Pre diabetes:          5.7%-6.4%  Diabetes:              >6.4%  Glycemic control for   <7.0% adults with diabetes     CBG: Recent Labs  Lab 04/12/24 1609 04/12/24 2146 04/13/24 0610 04/13/24 0755 04/13/24 1159  GLUCAP 104* 153* 171* 154* 107*    Review of Systems:   Awake and interactive Denies pain  Past Medical History:  He,  has a past medical history of At risk for sleep apnea, Atypical nevus (05/25/2005), Atypical nevus (04/04/2007),  Atypical nevus (04/04/2007), Atypical nevus (04/04/2007), Atypical nevus (11/29/2011), Atypical nevus (11/29/2011), Bladder cancer (HCC) (dx 07/2017), CHF (congestive heart failure) (HCC), Colostomy in place [facility]), Diabetes mellitus without complication (HCC), GERD (gastroesophageal reflux disease), H/O hiatal hernia, History of sepsis (09/2017), Prostate cancer [facility]) (urologist-- dr Rozanne Corners), RBBB, Renal disorder, Sleep apnea, Squamous cell carcinoma of skin (05/22/2013), and Wears glasses.   Surgical History:   Past Surgical History:  Procedure Laterality Date   ABDOMINAL SURGERY     APPENDECTOMY  1972   CHOLECYSTECTOMY  1985   COLONOSCOPY N/A 06/29/2021   Procedure: COLONOSCOPY;  Surgeon: Joyce Nixon, MD;  Location: [facility];  Service: Endoscopy;  Laterality: N/A;   COLOSTOMY REVERSAL N/A 01/08/2018   Procedure: COLOSTOMY REVERSAL;  Surgeon: Joyce Nixon, MD;  Location: [facility];  Service: General;  Laterality: N/A;   CYSTOSCOPY WITH RETROGRADE PYELOGRAM, URETEROSCOPY AND STENT PLACEMENT Right 06/10/2017   Procedure: CYSTOSCOPY WITH RIGHT URETEROSCOPY WITH RIGHT STENT PLACEMENT;  Surgeon: Florencio Hunting, MD;  Location: [facility];  Service: Urology;  Laterality: Right;    EUS N/A 04/16/2018   Procedure: FULL UPPER ENDOSCOPIC ULTRASOUND (EUS) RADIAL;  Surgeon: Evangeline Hilts, MD;  Location: [facility];  Service: Endoscopy;  Laterality: N/A;   FLEXIBLE SIGMOIDOSCOPY N/A 12/13/2017   Procedure: FLEXIBLE SIGMOIDOSCOPY;  Surgeon: Joyce Nixon, MD;  Location: [facility];  Service: Endoscopy;  Laterality: N/A;   ILEO CONDUIT     IR CATHETER TUBE CHANGE  12/13/2017   IR CATHETER TUBE CHANGE  01/17/2018   IR CATHETER TUBE CHANGE  02/28/2018   IR CATHETER TUBE CHANGE  07/18/2018   IR CATHETER TUBE CHANGE  08/22/2018   IR CATHETER TUBE CHANGE  10/31/2018   IR CONVERT LEFT NEPHROSTOMY TO NEPHROURETERAL CATH  10/24/2017   IR EXT NEPHROURETERAL CATH EXCHANGE  05/19/2018   IR EXT NEPHROURETERAL CATH EXCHANGE  06/16/2018   IR NEPHRO TUBE REMOV/FL  10/24/2017   IR NEPHROSTOGRAM LEFT THRU EXISTING ACCESS  12/05/2018   IR NEPHROSTOMY PLACEMENT LEFT  10/07/2017   IR NEPHROSTOMY TUBE CHANGE  04/11/2018   POLYPECTOMY  06/29/2021   Procedure: POLYPECTOMY;  Surgeon: Joyce Nixon, MD;  [facility];  Service: Endoscopy;;   ROBOT ASSISTED LAPAROSCOPIC RADICAL PROSTATECTOMY  10/02/2012   Procedure: ROBOTIC ASSISTED LAPAROSCOPIC RADICAL PROSTATECTOMY LEVEL 2;  Surgeon: [name], MD;  Location[facility];  Service: Urology;  Laterality: N/A;   ROBOTIC ASSISTED LAPAROSCOPIC BLADDER DIVERTICULECTOMY N/A 08/08/2017   Procedure: XI ROBOTIC ASSISTED LAPAROSCOPIC RADICAL CYSTECTOMY COVERTED TO OPEN PELVIC LYMPHADNECTOMY BILATERAL AND ILEAL CONDUIT URINARY DIVERSION;  Surgeon: Florencio Hunting, MD;  Location: [facility];  Service: Urology;  Laterality: N/A;   TRANSURETHRAL RESECTION OF BLADDER TUMOR  06/10/2017   Procedure: TRANSURETHRAL RESECTION OF BLADDER TUMOR (TURBT);  Surgeon: Florencio Hunting, MD;  [facility];  Service: Urology;;   VENTRAL HERNIA REPAIR N/A 04/10/2024   Procedure: REPAIR, HERNIA, VENTRAL;  Surgeon: Candyce Champagne, MD;  Location[facility];  Service: General;  Laterality: N/A;    XI ROBOT ABDOMINAL PERINEAL RESECTION N/A 08/08/2017   Procedure: REPAIR OF RECTAL TEAR POSSIBLE PARTIAL PROCTECTOMY, CREATION OF  OSTOMY;  Surgeon: Joyce Nixon, MD;  Location[facility];  Service: General;  Laterality: N/A;   XI ROBOTIC ASSISTED PARASTOMAL HERNIA REPAIR N/A 04/10/2024   Procedure: REPAIR, HERNIA, PARASTOMAL AND VENTRAL HERNIAS, ROBOT-ASSISTED, LEFT INGUINAL HERNIA, LYSIS OF ADHESIONS INCARCERATED AND INSIONAL HERNIAS AND UROSTOMY REVISION;  Surgeon: Candyce Champagne, MD;  Location: [facility];  Service: General;  Laterality: N/A;  Social History:   reports that he quit smoking about 44 years ago. His smoking use included cigarettes. He started smoking about 53 years ago. He has never used smokeless tobacco. He reports current alcohol  use. He reports that he does not use drugs.   Family History:  His family history includes Colon cancer in an other family member; Hypertension in his father; Lung cancer in his mother. There is no history of CAD, Diabetes, or Stroke.   Allergies Allergies  Allergen Reactions   Demerol  [Meperidine ] Nausea And Vomiting and Other (See Comments)    SEVERE NAUSEA     Home Medications  Prior to Admission medications   Medication Sig Start Date End Date Taking? Authorizing Provider  acetaminophen  (TYLENOL ) 500 MG tablet Take 1,000 mg by mouth every 8 (eight) hours as needed for mild pain or headache.    Yes [provider]  bisacodyl  (DULCOLAX) 5 MG EC tablet Take 2 tablets (10 mg total) by mouth daily as needed for moderate constipation. Patient taking differently: Take 15 mg by mouth daily. 12/09/18  Yes Armenta Landau, MD  Cyanocobalamin  (VITAMIN B-12 PO) Place 1 tablet under the tongue 3 (three) times a week.   Yes [provider]  cyclobenzaprine  (FLEXERIL ) 5 MG tablet Take 1 tablet (5 mg total) by mouth 3 (three) times daily as needed for muscle spasms. 04/10/24  Yes Candyce Champagne, MD  D-MANNOSE PO Take 2-3 tablets by mouth  See admin instructions. Take 2 tablets by mouth every morning & take 1 tablet by mouth at bedtime.   Yes [provider]  diphenhydrAMINE  (BENADRYL ) 25 mg capsule Take 25 mg by mouth every other day as needed (congestion/allergies (every other night)).   Yes [provider]  ELIQUIS 5 MG TABS tablet Take 5 mg by mouth 2 (two) times daily.   Yes [provider]  ipratropium (ATROVENT ) 0.03 % nasal spray Place 1 spray into both nostrils 3 (three) times daily as needed (congestion).   Yes [provider]  metFORMIN (GLUCOPHAGE) 500 MG tablet Take 500 mg by mouth in the morning. 01/29/24  Yes [provider]  Multiple Vitamins-Minerals (CENTRUM MINIS ADULTS 50+) TABS Take 1 tablet by mouth See admin instructions. Take 1 tablet by mouth twice daily on every other day.   Yes [provider]  Omega-3 Fatty Acids (FISH OIL PO) Take 1 capsule by mouth 3 (three) times a week.   Yes [provider]  oxyCODONE -acetaminophen  (PERCOCET) 5-325 MG tablet Take 1 tablet by mouth every 6 (six) hours as needed for severe pain (pain score 7-10). Can increase to 2 pills at a time if needed 04/10/24  Yes Gross, Landon Pinion, MD  polyethylene glycol (MIRALAX ) 17 g packet Take 17 g by mouth daily as needed (constipation.).   Yes [provider]  rosuvastatin  (CRESTOR ) 10 MG tablet Take 10 mg by mouth every evening.   Yes [provider]  triamcinolone  cream (KENALOG ) 0.1 % Apply 1 application topically daily as needed. 10/17/21  Yes Devon Fogo, MD    The patient is critically ill with multiple organ systems failure and requires high complexity decision making for assessment and support, frequent evaluation and titration of therapies, application of advanced monitoring technologies and extensive interpretation of multiple databases. Critical Care Time devoted to patient care services described in this note independent of APP/resident time (if applicable)   is 35 minutes.   Myer Artis MD [facility] Personal pager: See Amion If unanswered, please  page CCM On-call: [phone]

## 2024-04-13 NOTE — Progress Notes (Signed)
 PHARMACY - TOTAL PARENTERAL NUTRITION CONSULT NOTE   Indication: Prolonged ileus  Patient Measurements: Height: 5\' 11"  (180.3 cm) Weight: 108.9 kg (240 lb) IBW/kg (Calculated) : 75.3 TPN AdjBW (KG): 83.7 Body mass index is 33.47 kg/m.  Assessment: Pt is a 5 yoM who underwent hernia repair and urostomy revision on 04/10/24. Pt developed post-op ileus. Pharmacy consulted to manage TPN.   Glucose / Insulin : Hx DM on metformin PTA. A1c 5.3% -BG goal < 150. Range 104-171 (5 units SSI/24 hrs) Electrolytes: Na (133) low. All other electrolytes WNL -Ca 8.9 WNL (no recent albumin  to calculate CorrCa) -K 4.9 on upper end of normal (mIVF w/KCl completed 6/1). Pt now on LR @ 100 mL/hr Renal: SCr 1.65 - AKI. Baseline ~1?, BUN elevated Hepatic:  Intake / Output; MIVF:  -UOP: 1550 mL, NG 1050 mL this AM, drain: 330 mL, no stool -mIVF: LR @ 100 mL/hr GI Imaging: GI Surgeries / Procedures:  -5/30: Hernia repair, lysis of adhesions, urostomy revision  Central access: PICC ordered TPN start date: 6/2 pending PICC placement  Nutritional Goals: Pending RD assessment Goal TPN rate is ___ mL/hr (provides ___ g of protein and __ kcals per day)  RD Assessment:    Current Nutrition:  NPO and TPN  Plan:   Now:  Change mSSI TID AC to q6h Change mIVF from LR to NS (K 4.9, Na 133)  At 1800: Start TPN at 40 mL/hr Electrolytes in TPN: Na increased, K decreased from standard Na 150 mEq/L, K 25 mEq/L, Ca 5 mEq/L, Mg 5 mEq/L, and Phos 15 mmol/L.  Cl:Ac 1:1 Add standard MVI and trace elements to TPN Continue Moderate q6h SSI and adjust as needed  Thiamine 100 mg IV daily x 6 days Reduce MIVF to 60 mL/hr at 1800 Monitor TPN labs on Mon/Thurs, check electrolytes x3 days.   Shireen Dory, PharmD 04/13/2024,9:46 AM

## 2024-04-13 NOTE — Progress Notes (Signed)
Notified Lab that ABG being sent for analysis. 

## 2024-04-13 NOTE — Progress Notes (Signed)
 Pharmacy Antibiotic Note  Kristopher Thompson is a 73 y.o. male admitted on 04/10/2024 forhernia repair. Pt underwent hernia repair, lysis of adhesions, urostomy revision on 04/10/24. Pt received cefazolin  for surgical prophylaxis.   Pt now febrile, hypotensive. Pharmacy consulted to dose vancomycin , cefepime  for sepsis.   Today, 04/13/24 WBC slightly elevated, lactate 3.8, febrile AKI, SCr 1.65. Baseline ~1?  Plan: Cefepime  2 g IV q12h + metronidazole  500 mg IV q12h Vancomycin  2000 mg loading dose followed by 1000 mg IV q24h (estimated AUC 451, Vd 0.5) Goal AUC 400-550. Check levels as needed.  Monitor renal function, culture data  Height: 5\' 11"  (180.3 cm) Weight: 108.9 kg (240 lb) IBW/kg (Calculated) : 75.3  Temp (24hrs), Avg:100 F (37.8 C), Min:98.2 F (36.8 C), Max:103.5 F (39.7 C)  Recent Labs  Lab 04/11/24 0534 04/12/24 0437 04/13/24 0421 04/13/24 0713  WBC 11.1* 17.5* 13.3*  --   CREATININE  --  2.08* 1.65*  --   LATICACIDVEN  --   --   --  3.8*    Estimated Creatinine Clearance: 51.5 mL/min (A) (by C-G formula based on SCr of 1.65 mg/dL (H)).    Allergies  Allergen Reactions   Demerol  [Meperidine ] Nausea And Vomiting and Other (See Comments)    SEVERE NAUSEA    Antimicrobials this admission: Cefazolin  peri-operatively 5/30 >> 5/31 cefepime  6/2 >>  Metronidazole  6/2 >> Vancomycin  6/2 >>  Dose adjustments this admission:  Microbiology results: 6/2 BCx:  6/2 UCx:    Shireen Dory, PharmD 04/13/2024 10:27 AM

## 2024-04-13 NOTE — Progress Notes (Signed)
 0503- vital signs reported by nurse Tech which is on RED MEWS.  0550- Repeated the Vitalls, BP 111/72 Pulse is 143 RR 22 RRT informed. 0600 RRT nurse came and took over,EKG done, Blood sugar checked 171. 1610- Surgery Dr. On call informed. 5 mg lopressor  IV ordered and given at 0625.

## 2024-04-14 ENCOUNTER — Inpatient Hospital Stay (HOSPITAL_COMMUNITY)

## 2024-04-14 DIAGNOSIS — G4733 Obstructive sleep apnea (adult) (pediatric): Secondary | ICD-10-CM

## 2024-04-14 DIAGNOSIS — J9601 Acute respiratory failure with hypoxia: Secondary | ICD-10-CM

## 2024-04-14 DIAGNOSIS — E119 Type 2 diabetes mellitus without complications: Secondary | ICD-10-CM

## 2024-04-14 DIAGNOSIS — R9431 Abnormal electrocardiogram [ECG] [EKG]: Secondary | ICD-10-CM

## 2024-04-14 DIAGNOSIS — A419 Sepsis, unspecified organism: Secondary | ICD-10-CM | POA: Diagnosis not present

## 2024-04-14 DIAGNOSIS — K430 Incisional hernia with obstruction, without gangrene: Secondary | ICD-10-CM | POA: Diagnosis not present

## 2024-04-14 DIAGNOSIS — R6521 Severe sepsis with septic shock: Secondary | ICD-10-CM | POA: Diagnosis not present

## 2024-04-14 LAB — GLUCOSE, CAPILLARY
Glucose-Capillary: 109 mg/dL — ABNORMAL HIGH (ref 70–99)
Glucose-Capillary: 115 mg/dL — ABNORMAL HIGH (ref 70–99)
Glucose-Capillary: 140 mg/dL — ABNORMAL HIGH (ref 70–99)
Glucose-Capillary: 141 mg/dL — ABNORMAL HIGH (ref 70–99)
Glucose-Capillary: 142 mg/dL — ABNORMAL HIGH (ref 70–99)

## 2024-04-14 LAB — BASIC METABOLIC PANEL WITH GFR
Anion gap: 9 (ref 5–15)
Anion gap: 12 (ref 5–15)
BUN: 46 mg/dL — ABNORMAL HIGH (ref 8–23)
BUN: 41 mg/dL — ABNORMAL HIGH (ref 8–23)
CO2: 20 mmol/L — ABNORMAL LOW (ref 22–32)
CO2: 20 mmol/L — ABNORMAL LOW (ref 22–32)
Calcium: 8.2 mg/dL — ABNORMAL LOW (ref 8.9–10.3)
Calcium: 8.1 mg/dL — ABNORMAL LOW (ref 8.9–10.3)
Chloride: 105 mmol/L (ref 98–111)
Chloride: 105 mmol/L (ref 98–111)
Creatinine, Ser: 1.27 mg/dL — ABNORMAL HIGH (ref 0.61–1.24)
Creatinine, Ser: 1.25 mg/dL — ABNORMAL HIGH (ref 0.61–1.24)
GFR, Estimated: >60 mL/min (ref >60)
GFR, Estimated: >60 mL/min (ref >60)
Glucose, Bld: 142 mg/dL — ABNORMAL HIGH (ref 70–99)
Glucose, Bld: 105 mg/dL — ABNORMAL HIGH (ref 70–99)
Potassium: 3.6 mmol/L (ref 3.5–5.1)
Potassium: 3.6 mmol/L (ref 3.5–5.1)
Sodium: 134 mmol/L — ABNORMAL LOW (ref 135–145)
Sodium: 137 mmol/L (ref 135–145)

## 2024-04-14 LAB — ECHOCARDIOGRAM COMPLETE
Area-P 1/2: 4.96 cm2
Height: 71 in
S' Lateral: 3.3 cm
Weight: 3839.99 [oz_av]

## 2024-04-14 LAB — PROTIME-INR
INR: 1.6 — ABNORMAL HIGH (ref 0.8–1.2)
Prothrombin Time: 19 s — ABNORMAL HIGH (ref 11.4–15.2)

## 2024-04-14 LAB — CBC
HCT: 23.7 % — ABNORMAL LOW (ref 39.0–52.0)
Hemoglobin: 7.6 g/dL — ABNORMAL LOW (ref 13.0–17.0)
MCH: 27.2 pg (ref 26.0–34.0)
MCHC: 32.1 g/dL (ref 30.0–36.0)
MCV: 84.9 fL (ref 80.0–100.0)
Platelets: 127 10*3/uL — ABNORMAL LOW (ref 150–400)
RBC: 2.79 MIL/uL — ABNORMAL LOW (ref 4.22–5.81)
RDW: 14.2 % (ref 11.5–15.5)
WBC: 7.9 10*3/uL (ref 4.0–10.5)
nRBC: 0 % (ref 0.0–0.2)

## 2024-04-14 LAB — URINE CULTURE: Culture: NO GROWTH

## 2024-04-14 LAB — PREALBUMIN: Prealbumin: <5 mg/dL — ABNORMAL LOW (ref 18–38)

## 2024-04-14 LAB — MAGNESIUM: Magnesium: 2.3 mg/dL (ref 1.7–2.4)

## 2024-04-14 LAB — PHOSPHORUS: Phosphorus: 1.9 mg/dL — ABNORMAL LOW (ref 2.5–4.6)

## 2024-04-14 LAB — PREPARE RBC (CROSSMATCH)

## 2024-04-14 LAB — SURGICAL PATHOLOGY

## 2024-04-14 LAB — HEMOGLOBIN AND HEMATOCRIT, BLOOD
HCT: 26.1 % — ABNORMAL LOW (ref 39.0–52.0)
Hemoglobin: 8.6 g/dL — ABNORMAL LOW (ref 13.0–17.0)

## 2024-04-14 LAB — LACTIC ACID, PLASMA: Lactic Acid, Venous: 2.1 mmol/L (ref 0.5–1.9)

## 2024-04-14 MED ORDER — VANCOMYCIN HCL 1500 MG/300ML IV SOLN
1500.0000 mg | INTRAVENOUS | Status: DC
  Administered 2024-04-14 – 2024-04-15 (×2): 1500 mg via INTRAVENOUS
  Filled 2024-04-14 (×2): qty 300

## 2024-04-14 MED ORDER — SODIUM CHLORIDE 0.9% IV SOLUTION: Freq: Once | INTRAVENOUS | Status: AC

## 2024-04-14 MED ORDER — PERFLUTREN LIPID MICROSPHERE
1.0000 mL | INTRAVENOUS | Status: AC | PRN
  Administered 2024-04-14: 5 mL via INTRAVENOUS

## 2024-04-14 MED ORDER — POTASSIUM PHOSPHATES 15 MMOLE/5ML IV SOLN
30.0000 mmol | Freq: Once | INTRAVENOUS | Status: AC
  Administered 2024-04-14: 30 mmol via INTRAVENOUS
  Filled 2024-04-14: qty 10

## 2024-04-14 MED ORDER — IOHEXOL 350 MG/ML SOLN
75.0000 mL | Freq: Once | INTRAVENOUS | Status: AC | PRN
  Administered 2024-04-14: 75 mL via INTRAVENOUS

## 2024-04-14 MED ORDER — FUROSEMIDE 10 MG/ML IJ SOLN
20.0000 mg | Freq: Once | INTRAMUSCULAR | Status: AC
  Administered 2024-04-14: 20 mg via INTRAVENOUS
  Filled 2024-04-14: qty 2

## 2024-04-14 MED ORDER — SODIUM CHLORIDE 0.9 % IV SOLN
2.0000 g | Freq: Three times a day (TID) | INTRAVENOUS | Status: AC
  Administered 2024-04-14 – 2024-04-19 (×15): 2 g via INTRAVENOUS
  Filled 2024-04-14 (×15): qty 12.5

## 2024-04-14 MED ORDER — LACTATED RINGERS IV BOLUS
1000.0000 mL | Freq: Once | INTRAVENOUS | Status: AC
  Administered 2024-04-14: 1000 mL via INTRAVENOUS

## 2024-04-14 MED ORDER — POTASSIUM PHOSPHATES 15 MMOLE/5ML IV SOLN: 30.0000 mmol | Freq: Once | INTRAVENOUS | Status: DC

## 2024-04-14 MED ORDER — MAGIC MOUTHWASH
15.0000 mL | Freq: Four times a day (QID) | ORAL | Status: AC
  Administered 2024-04-14: 15 mL via ORAL
  Filled 2024-04-14 (×12): qty 15

## 2024-04-14 MED ORDER — TRAVASOL 10 % IV SOLN
INTRAVENOUS | Status: AC
  Filled 2024-04-14: qty 733.2

## 2024-04-14 NOTE — Plan of Care (Signed)
   Problem: Clinical Measurements: Goal: Will remain free from infection Outcome: Progressing Goal: Diagnostic test results will improve Outcome: Progressing Goal: Respiratory complications will improve Outcome: Progressing Goal: Cardiovascular complication will be avoided Outcome: Progressing   Problem: Nutrition: Goal: Adequate nutrition will be maintained Outcome: Progressing

## 2024-04-14 NOTE — Progress Notes (Addendum)
 Films reviewed with Dr. Martin Slay with radiology.  He notes a small pocket a intraperitoneal fluid with higher density raises concern of bowel leak.  A dot of gas in the right lower quadrant flank on 1 view that could be a perforation.  No major fluid or gas around that area though.  Drain nearby serosanguinous  Patient clinically improved coming off pressors with lactate nearly normal reassuring that shock is resolving.  With a bowel injury he should be worsening or not improving.  Following hemoglobin with worsening flank ecchymosis raises probable hemorrhagic shock etiology.    Given the improvement with aggressive ICU care, we will hold off on operative exploration right now. Continue internal NG tube drainage along with intraperitoneal and abdominal wall drains, IV ABx, TPN, rescucitation, and bowel rest.  Transfuse with dropping hemoglobin and ecchymosis.  If worsens or does not improve,  then plan lap/robotic possible open exploration to rule out delayed bowel leak.  This with my CCS partners feel this is a reasonable plan for now.  Continue to follow very closely and treat aggressively

## 2024-04-14 NOTE — Consult Note (Signed)
 NAME:  Kristopher Thompson, MRN:  [id number], DOB:  May 06, 1951, LOS: 4 ADMISSION DATE:  04/10/2024, CONSULTATION DATE:  04/13/2024 REFERRING MD:  Dr Bonita Bussing, CHIEF COMPLAINT: Sepsis  History of Present Illness:  S/p repair of incisional/parastomal and ventral hernias, left inguinal incarcerated , abdominal hernias, lysis of adhesions, urostomy revision Day 1 postoperatively  Transferred to the ICU secondary to tachycardia, tachypnea  History of bladder cancer, prostate cancer, diastolic heart failure, diabetes, right bundle branch block, multiple abdominal surgeries  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   moderate atypia - right low back   Atypical nevus 04/04/2007   moderate to marked - right upper back (wider shave)   Atypical nevus 04/04/2007   moderate atypia - center chest (wider shave)   Atypical nevus 04/04/2007   slight atypia - right thigh   Atypical nevus 11/29/2011   mild atypia - center upper back   Atypical nevus 11/29/2011   mild atypia - center chest   Bladder cancer (HCC) dx 07/2017   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 denies   Sleep apnea    cpap   Squamous cell carcinoma of skin 05/22/2013   left cheek - CX3 + 5FU   Wears glasses    Significant Hospital Events: Including procedures, antibiotic start  and stop dates in addition to other pertinent events   04/10/2024-laparoscopic surgery 04/13/24 CCM consult. Incr NE. Art line   Interim History / Subjective:  Improving leukocytosis and NE is coming down   More alert   Objective    Blood pressure (!) 122/56, pulse (!) 109, temperature 99.3 F (37.4 C), temperature source Axillary, resp. rate (!) 27, height 5\' 11"  (1.803 m), weight 108.9 kg, SpO2 100%.        Intake/Output Summary (Last 24 hours) at 04/14/2024 0945 Last data filed at 04/14/2024 0805 Gross per 24 hour  Intake 4734.57 ml  Output 2025 ml  Net 2709.57 ml   Filed Weights   04/10/24 0536  Weight: 108.9 kg    Examination: General: Ill appearing older adult M NAD  HENT: NCATpink mm  Lungs: Even unlabored. Diminished R base  Cardiovascular: tachycardic  Abdomen: soft non tender  Extremities: trace ble edema   Neuro: lethargic following commands oriented x3 GU: yellow urine   Resolved problem list   Assessment and Plan    Septic shock suspected abd source  Incarcerated abd wall hernias s/p LOA, hernia repairs,  urostomy ileal conduit revision  Post op ileus  -CT a/p 6/3 c/f possible small bowel perf + possible small bowel ileus + possible surgical incision dehiscence + a small fluid collection lower anterior abd wall (L paramidline). CCS and rad have discussed these findings  P -post op  per CCS  -cont supportive care : IVF abx NGT/bowel rest -- do worry about fluid vs his pleural effusion however, further discussed below  -if he further declines or fails to improve, possible ex lap  -echo is ordered as well   Acute hypoxic resp failure R pleural effusion OSA on CPAP  -PE r/o P -IS, flutter, pulm hygiene  -Especially as pt is to start TPN, would advocate against ongoing aggressive IVF resusc and would advocate for diuresing in the next day or two pending clinical course. If his resp status declines could also consider tapping   AKI   Hypophosphatemia Borderline hypokalemia  -improving. Cr down to 1.27  P -follow renal indices UOP  -replace K & phos -- will touch base w pharmD about this before I order to make sure is hasn't been accounted for in TPN order already   R flank hematoma ABLA -he is getting 2 PRBC 6/3 for hgb 8.5 6/2 night + visible hematoma  P -awaiting CBC 6/3 -for now, cont vte ppx. 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 Holding acutely. On lovenox  ppx dose       Best Practice (right click and "Reselect all SmartList Selections" daily)   Diet/type: NPO DVT prophylaxis SCD  Pressure ulcer(s): N/A GI prophylaxis: N/A Lines: Central line,PICC art line Foley:  Yes, and it is still needed Code Status:  full code Last date of multidisciplinary goals of care discussion [d/w family at bedside 6/3  Labs   CBC: Recent Labs  Lab 04/11/24 0534 04/12/24 0437 04/13/24 0421 04/13/24 1835  WBC 11.1* 17.5* 13.3* 10.8*  NEUTROABS  --   --   --  8.5*  HGB 12.5* 11.2* 12.1* 8.5*  HCT 39.5 34.4* 38.2* 26.4*  MCV 89.8 86.4 90.3 86.8  PLT 188 181 166 152    Basic Metabolic Panel: Recent Labs  Lab 04/12/24 0437 04/13/24 0421 04/13/24 1047 04/13/24 1835 04/14/24 0601  NA 133* 133* 134* 133* 134*  K 5.1 4.9 4.5 3.8 3.6  CL 103 102 102 103 105  CO2 22 19* 19* 19* 20*  GLUCOSE 163* 140* 137* 129* 142*  BUN 40* 40* 50* 54* 46*  CREATININE 2.08* 1.65* 1.99* 1.91* 1.27*  CALCIUM  8.5* 8.9 8.9 8.4* 8.2*  MG 2.1  --   --   --  2.3  PHOS 2.7  --   --   --  1.9*   GFR: Estimated Creatinine Clearance: 66.9 mL/min (A) (by C-G formula based on SCr of 1.27 mg/dL (H)). Recent Labs  Lab 04/11/24 0534 04/12/24 0437 04/13/24 0421 04/13/24 0713 04/13/24 1047 04/13/24 1414 04/13/24 1835 04/14/24 0601  WBC 11.1* 17.5* 13.3*  --   --   --  10.8*  --   LATICACIDVEN  --   --   --  3.8* 5.1* 5.4*  --  2.1*    Liver  Function Tests: Recent Labs  Lab 04/13/24 1047 04/13/24 1835  AST 25 29  ALT 8 9  ALKPHOS 50 41  BILITOT 1.0 1.6*  PROT 5.6* 5.4*  ALBUMIN  2.6* 2.7*   No results for input(s): "LIPASE", "AMYLASE" in the last 168 hours. No results for input(s): "AMMONIA" in the last 168 hours.  ABG    Component Value Date/Time   PHART 7.44 04/13/2024 1319   PCO2ART 24 (L) 04/13/2024 1319   PO2ART 118 (H) 04/13/2024 1319   HCO3 16.7 (L)  04/13/2024 1319   TCO2 24 08/08/2017 1734   ACIDBASEDEF 6.1 (H) 04/13/2024 1319   O2SAT 100 04/13/2024 1319     Coagulation Profile: Recent Labs  Lab 04/13/24 1047  INR 1.6*    Cardiac Enzymes: No results for input(s): "CKTOTAL", "CKMB", "CKMBINDEX", "TROPONINI" in the last 168 hours.  HbA1C: Hgb A1c MFr Bld  Date/Time Value Ref Range Status  04/01/2024 10:50 AM 5.3 4.8 - 5.6 % Final    Comment:    (NOTE) Pre diabetes:          5.7%-6.4%  Diabetes:              >6.4%  Glycemic control for   <7.0% adults with diabetes     CBG: Recent Labs  Lab 04/13/24 0755 04/13/24 1159 04/13/24 1512 04/14/24 0024 04/14/24 0605  GLUCAP 154* 107* 98 142* 141*    CRITICAL CARE Performed by: Delories Fetter   Total critical care time: 49 minutes  Critical care time was exclusive of separately billable procedures and treating other patients. Critical care was necessary to treat or prevent imminent or life-threatening deterioration.  Critical care was time spent personally by me on the following activities: development of treatment plan with patient and/or surrogate as well as nursing, discussions with consultants, evaluation of patient's response to treatment, examination of patient, obtaining history from patient or surrogate, ordering and performing treatments and interventions, ordering and review of laboratory studies, ordering and review of radiographic studies, pulse oximetry and re-evaluation of patient's condition.  Eston Hence MSN,  AGACNP-BC [facility] Amion for pager  04/14/2024, 9:45 AM

## 2024-04-14 NOTE — Progress Notes (Signed)
 PT Cancellation Note  Patient Details Name: Kristopher Thompson MRN: [id number] DOB: [birth year]   Cancelled Treatment:     secure chat with RN to "hold off for today" PHYSICAL THERAPY Patient is still on high dose pressors and has a new Femoral A line.  Rehab Team to continue to follow and attempt to see another day as schedule and medical stability permit.   Bess Broody  PTA Acute  Rehabilitation Services Office M-F          [phone]

## 2024-04-14 NOTE — Progress Notes (Addendum)
 Pharmacy Antibiotic Note  Kristopher Thompson is a 73 y.o. male who is known to pharmacy from TPN consult. Pharmacy was consulted earlier today(04/10/24) by [facility] to dose  cefepime , vancomycin  and cefepime  for sepsis. Dr. Hershell Thompson came by later and d/ced abx.  CCS now wants start start abx for patient. Per [place] msg with Dr. Gaynell Thompson, start vancomycin , cefepime  and flagyl  instead of vancomycin /zosyn .  On 6/3, Scr has improved and is down to 1.27 from previous level of 1.91.   Plan: - Adjust vancomycin  dose to  1500 mg IV q24h for war AUC 534 - Adjust cefepime   to 2gm IV q8h - flagyl  500 mg q12h  ________________________________  Height: 5\' 11"  (180.3 cm) Weight: 108.9 kg (240 lb) IBW/kg (Calculated) : 75.3  Temp (24hrs), Avg:99.3 F (37.4 C), Min:98.5 F (36.9 C), Max:100.2 F (37.9 C)  Recent Labs  Lab 04/11/24 0534 04/12/24 0437 04/13/24 0421 04/13/24 0713 04/13/24 1047 04/13/24 1414 04/13/24 1835 04/14/24 0601  WBC 11.1* 17.5* 13.3*  --   --   --  10.8*  --   CREATININE  --  2.08* 1.65*  --  1.99*  --  1.91* 1.27*  LATICACIDVEN  --   --   --  3.8* 5.1* 5.4*  --  2.1*    Estimated Creatinine Clearance: 66.9 mL/min (A) (by C-G formula based on SCr of 1.27 mg/dL (H)).    Allergies  Allergen Reactions   Demerol  [Meperidine ] Nausea And Vomiting and Other (See Comments)    SEVERE NAUSEA      Kristopher Thompson, PharmD, BCPS 04/14/2024 10:02 AM

## 2024-04-14 NOTE — Progress Notes (Addendum)
 04/14/2024  Kristopher Thompson [id number] [birth year]  CARE TEAM: PCP: Dudley Ghee, MD  Outpatient Care Team: Patient Care Team: Dudley Ghee, MD as PCP - General (Internal Medicine) Devon Fogo, MD (Inactive) as Consulting Physician (Dermatology) Lavina Pou, MD as Referring Physician (Pulmonary Disease) Candyce Champagne, MD as Consulting Physician (General Surgery) Florencio Hunting, MD as Consulting Physician (Urology)  Inpatient Treatment Team: Treatment Team:  Candyce Champagne, MD Danice Dural, MD Pccm, Md, MD Ainsley Houston, RN Lawayne President, RRT Covil, Janell Media, NT Lady Pier, MD Etter Hermann., MD Layvonne Primas, [place]   Problem List:   Principal Problem:   Incarcerated incisional hernia Active Problems:   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 08/08/2017   AKI (acute kidney injury) [facility])   Sinus tachycardia   Tachypnea   Acute respiratory insufficiency, postoperative   Sepsis due to undetermined organism (HCC)   Lactic acidosis   Class 2 obesity   04/10/2024  POST-OPERATIVE DIAGNOSIS:  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  Largest hernia was in the right lower quadrant around the ileal conduit urostomy.  16 x 12 cm region.  Incarcerated with over a foot of small bowel.  Midline next largest 9 x 8 cm incarcerated with numerous loops of small bowel.  Left lower quadrant with 15 cm of  colon at old colostomy site.  Another direct space hernia in the left lower quadrant and indirect hernia on the left lower quadrant.  No obvious hernias on the right side.   Component separation's done on both sides to try and get the largest midline and parastomal hernias close down with running serrated try to fix absorbable suture.  Broad underlay repair with 30 x 35 cm mesh transverse  Assessment [facility] Stay = 4 days) 4 Days Post-Op    Shock somewhat improved    Plan:  Continue volume resuscitation for now.  Weaning pressors guardedly hopeful sign.  Continue broad antibiotics for now and follow-up on cultures.  UA dirty but he has a chronic urostomy/ileal conduit so hard to say if that is truly urosepsis or more likely just chronic contamination.  See what cultures show make sure there is not a drug-resistant organism that needs adjustment to antibiotics.  CAT scan STAT eventually done with some ICU advocacy.  I called radiology department to get them to read it stat.  I asked them to call me so I know the results of soon as possible.  To my view, I see no evidence of perforation or leak that warrants emergent surgery.  Contrast does get to the colon, arguing against any major obstruction.  I see no perforation or undrained abscess.  He does have a lot of ecchymosis and edema on his right flank.  Bowel comes up into that area.  Suspicious for disruption of his mesh and hernia recurrence on the right side near his parastomal giant hernia.  I suspect there is eventration mainly.  However there is no evidence of a a choke point of strangulation to my view and  drains in that region are serosanguineous.  Guardedly hopeful but we will see.  CT of chest negative for any obvious pulmonary embolism or pneumonia.  Oxygen needs less.  Awaiting results of echocardiogram done yesterday as well.  BNP going up.  Discussed with Dr. Gaynell Keeler with critical care who will help bird-dog that to see if there  is of component of heart failure/cardiac shock as well.  Troponin bumped up but we both feel most likely cardiac strain and not a true MI.    Becoming anemic which correlates with ecchymosis on flanks.  Most likely oozing from TAR releases.  One of his shock has been more hemorrhagic.  I think he would benefit from transfusion for now in the absence of any major perforation leak peritonitis.  Discussed with Dr. Gaynell Keeler with critical care who is coming back on this morning.  Lower social to consider transfusion given his hemoglobin is less than 10 and he is still on pressors.  See what hemoglobin is this morning  TPN for postoperative ileus  Tachycardia looks like sinus rhythm and not atrial fibrillation.  Await results from echocardiogram.  Continue to wean off Levophed and give volume.  Low threshold to transfuse  Some AKI in the setting chronic kidney disease status post cystectomy with ureteral-ileal conduit urostomy.  He has decent urine output.  Hopefully will not have a worsening creatinine with the IV contrasted needed to better assess his chest abdomen pelvis.  Monitor  -monitor electrolytes & replace as needed.  Keep K>4, Mg>2, Phos>3  -Diabetes.  Sliding scale insulin .  Some mild hyperglycemia but nothing too severe.  Keep slightly elevated/on the sweet side to avoid severe hypoglycemic events.  -VTE prophylaxis- SCDs.  Anticoagulation prophyllaxis SQ as appropriate  -mobilize as tolerated to help recovery.  Enlist therapies in moderate/high risk patients as appropriate.  He seems rather tired and deconditioned and is at least a two-person assist.  Hopefully we can get him up to the chair more to help him.    I updated the patient's status to the the patient, his wife at bedside, nurse in room.  Recommendations were made.  Questions were answered.  They expressed understanding & appreciation.  -Disposition: He is going to be here a while       I reviewed nursing notes, last 24  h vitals and pain scores, last 48 h intake and output, last 24 h labs and trends, and last 24 h imaging results.  I have reviewed this patient's available data, including medical history, events of note, test results, etc as part of my evaluation.   A significant portion of that time was spent in counseling. Care during the described time interval was provided by me.  This care required high  level of medical decision making.  04/14/2024    Subjective: (Chief complaint)  Remains in ICU.  He feels better overall.  Less abdominal cramping but sore in his right flank.  Eventually pt got enteral contrast and CAT scan at 4 AM.  Not read yet.  Weaning pressors back down.  Patient feels like he is starting to pass gas but no bowel movements.  Sore at nose and throat with NG tube.  Wife in room. ICU nursing just outside room.  Critical care attending Dr Gaynell Keeler on floor.  Objective:  Vital signs:  Vitals:   04/14/24 0430 04/14/24 0445 04/14/24 0500 04/14/24 0600  BP:   (!) 122/49 (!) 130/42  Pulse: (!) 115 (!) 109 (!) 109 (!) 112  Resp: (!) 27 (!) 28 (!) 27 (!) 27  Temp:   99.2 F (37.3 C)   TempSrc:   Oral   SpO2: 100% 100% 100% 100%  Weight:      Height:        Last BM Date : 04/09/24  Intake/Output   Yesterday:  06/02 0701 - 06/03 0700 In: 4586.7 [I.V.:2763.6; IV Piggyback:1823.1] Out: 3290 [Urine:1575; Emesis/NG output:1600; Drains:115] This shift:  No intake/output data recorded.  Bowel function:  Flatus: No  BM:  No  Drains:  RLQ in hernia sac subcutaneous tissues of abdominal wall: Very thin serosanguineous LUQ intraperitoneal / pelvis:  serosanguineous   Physical Exam:  General: Pt awake/alert in mild acute distress.  Looks tired but not sickly.  Joking but looks exhausted Eyes: PERRL, normal EOM.  Sclera clear.  No icterus Neuro: CN II-XII intact w/o focal sensory/motor deficits. Lymph: No head/neck/groin lymphadenopathy Psych:  No  delerium/psychosis/paranoia.  Oriented x 4.  No confusion. HENT: Normocephalic, Mucus membranes moist.  No thrush Neck: Supple, No tracheal deviation.  No obvious thyromegaly Chest: No pain to chest wall compression.  Good respiratory excursion.  No audible wheezing.  Lungs clear to auscultation without wheezing, rales, nor rhonchi CV:  Pulses intact.  Regular rhythm.  No major extremity edema MS: Normal AROM mjr joints.  No obvious deformity  Abdomen: Soft.  Mildy distended.  Tenderness at right flank with significant ecchymosis especially posteriorly and edema.  No crepitus or cellulitis.  Incisions clean dry intact.  No purulent drainage..  No evidence of guarding nor peritonitis.  No incarcerated hernias.   No cellulitis abscess or drainage.  No evidence of any succus or purulence. Right lower quadrant ileal conduit urostomy with some edema but viable with clear lightly yellow-colored urine in bag.   Ext:   No deformity.  No mjr edema.  No cyanosis Skin: No petechiae / purpurea.  No major sores.  Warm and dry    Results:   Cultures: Recent Results (from the past 720 hours)  MRSA Next Gen by PCR, Nasal     Status: None   Collection Time: 04/13/24 11:35 AM   Specimen: Nasal Mucosa; Nasal Swab  Result Value Ref Range Status   MRSA by PCR Next Gen NOT DETECTED NOT DETECTED Final    Comment: (NOTE) The GeneXpert MRSA Assay (FDA approved for NASAL specimens only), is one component of a comprehensive MRSA colonization surveillance program. It is not intended to diagnose MRSA infection nor to guide or monitor treatment for MRSA infections. Test performance is not FDA approved in patients less than 30 years old. Performed at [facility], [address] [place], [place] [address]     Labs: Results for orders placed or performed during the hospital encounter of 04/10/24 (from the past 48 hours)  Glucose, capillary     Status: Abnormal   Collection Time: 04/12/24   7:42 AM  Result Value Ref Range   Glucose-Capillary 149 (H) 70 - 99 mg/dL    Comment: Glucose reference range applies only to samples taken after fasting for at least 8 hours.  Glucose, capillary     Status: Abnormal   Collection Time: 04/12/24 11:29 AM  Result Value Ref Range   Glucose-Capillary 163 (H) 70 - 99 mg/dL    Comment: Glucose reference range applies only to samples taken after fasting for at least 8 hours.  Glucose, capillary     Status: Abnormal   Collection Time: 04/12/24  4:09 PM  Result Value Ref Range   Glucose-Capillary 104 (H) 70 - 99 mg/dL    Comment: Glucose reference range applies only to samples taken after fasting for at least 8 hours.  Glucose, capillary     Status: Abnormal   Collection Time: 04/12/24  9:46 PM  Result Value Ref Range   Glucose-Capillary 153 (H) 70 - 99 mg/dL    Comment: Glucose reference range applies only to samples taken after fasting for at least 8 hours.  CBC     Status: Abnormal   Collection Time: 04/13/24  4:21 AM  Result Value Ref Range   WBC 13.3 (H) 4.0 - 10.5 K/uL   RBC 4.23 4.22 - 5.81 MIL/uL   Hemoglobin 12.1 (L) 13.0 - 17.0 g/dL   HCT 42.5 (L) 95.6 - 38.7 %   MCV 90.3 80.0 - 100.0 fL   MCH 28.6 26.0 - 34.0 pg   MCHC 31.7 30.0 - 36.0 g/dL   RDW 56.4 33.2 - 95.1 %   Platelets 166 150 - 400 K/uL   nRBC 0.0 0.0 - 0.2 %    Comment: Performed at [facility], [address], [place], [place] [address]  Basic metabolic panel with GFR     Status: Abnormal   Collection Time: 04/13/24  4:21 AM  Result Value Ref Range   Sodium 133 (L) 135 - 145 mmol/L   Potassium 4.9 3.5 - 5.1 mmol/L   Chloride 102 98 - 111 mmol/L   CO2 19 (L) 22 - 32 mmol/L   Glucose, Bld 140 (H) 70 - 99 mg/dL    Comment: Glucose reference range applies only to samples taken after fasting for at least 8 hours.   BUN 40 (H) 8 - 23 mg/dL   Creatinine, Ser 6.06 (H) 0.61 - 1.24 mg/dL   Calcium  8.9 8.9 - 10.3 mg/dL   GFR, Estimated 44 (L) >60  mL/min    Comment: (NOTE) Calculated using the CKD-EPI Creatinine Equation (2021)    Anion gap 12 5 - 15    Comment: Performed at [facility], [address] [place] [address], [place], [place] [address]  Glucose, capillary     Status: Abnormal   Collection Time: 04/13/24  6:10 AM  Result Value Ref Range   Glucose-Capillary 171 (H) 70 - 99 mg/dL    Comment: Glucose reference range applies only to samples taken after fasting for at least 8 hours.  Blood gas, arterial     Status: Abnormal   Collection Time: 04/13/24  7:01 AM  Result Value Ref Range   FIO2 21 %   Delivery systems ROOM AIR    pH, Arterial 7.46 (H) 7.35 - 7.45   pCO2 arterial 26 (L) 32 - 48 mmHg   pO2, Arterial 111 (H) 83 - 108 mmHg   Bicarbonate 17.9 (L) 20.0 - 28.0 mmol/L   Acid-base deficit 3.8 (H) 0.0 - 2.0 mmol/L   O2 Saturation 100 %   Patient temperature 39.7    Collection site RIGHT RADIAL    Drawn by [id number]    Allens test (pass/fail) PASS PASS    Comment: Performed at [facility], [address] [place], [place] [address]  Lactic acid, plasma     Status: Abnormal   Collection Time: 04/13/24  7:13 AM  Result Value Ref Range   Lactic Acid, Venous 3.8 (HH) 0.5 - 1.9 mmol/L    Comment: CRITICAL RESULT CALLED TO, READ BACK BY AND VERIFIED WITH Terrell Hills, J RN AT [phone]  ON 04/13/2024 BY Anibal Kent Performed at [facility], [address], [place], [place] [address]   Glucose, capillary     Status: Abnormal   Collection Time: 04/13/24  7:55 AM  Result Value Ref Range   Glucose-Capillary 154 (H) 70 - 99 mg/dL    Comment: Glucose reference range applies only to samples taken after fasting for at least 8 hours.  Urinalysis, Routine w reflex microscopic -Urine, Clean Catch     Status: Abnormal   Collection Time: 04/13/24 10:18 AM  Result Value Ref Range   Color, Urine YELLOW YELLOW   APPearance CLOUDY (A) CLEAR   Specific Gravity, Urine 1.016 1.005 - 1.030   pH 6.0 5.0 - 8.0    Glucose, UA NEGATIVE NEGATIVE mg/dL   Hgb urine dipstick LARGE (A) NEGATIVE   Bilirubin Urine NEGATIVE NEGATIVE   Ketones, ur 5 (A) NEGATIVE mg/dL   Protein, ur 604 (A) NEGATIVE mg/dL   Nitrite NEGATIVE NEGATIVE   Leukocytes,Ua LARGE (A) NEGATIVE   RBC / HPF >50 0 - 5 RBC/hpf   WBC, UA >50 0 - 5 WBC/hpf   Bacteria, UA NONE SEEN NONE SEEN   Squamous Epithelial / HPF 0-5 0 - 5 /HPF   Amorphous Crystal PRESENT     Comment: Performed at [facility], [address], [place], [place] [address]  Lactic acid, plasma     Status: Abnormal   Collection Time: 04/13/24 10:47 AM  Result Value Ref Range   Lactic Acid, Venous 5.1 (HH) 0.5 - 1.9 mmol/L    Comment: CRITICAL RESULT CALLED TO, READ BACK BY AND VERIFIED WITH DOMAN, J RN AT 1144 ON 04/13/2024 BY Anibal Kent Performed at [facility], [address] [place], [place] [address]   Comprehensive metabolic panel     Status: Abnormal   Collection Time: 04/13/24 10:47 AM  Result Value Ref Range   Sodium 134 (L) 135 - 145 mmol/L   Potassium 4.5 3.5 - 5.1 mmol/L   Chloride 102 98 - 111 mmol/L   CO2 19 (L) 22 - 32 mmol/L   Glucose, Bld 137 (H) 70 - 99 mg/dL    Comment: Glucose reference range applies only to samples taken after fasting for at least 8 hours.   BUN 50 (H) 8 - 23 mg/dL   Creatinine, Ser 7.82 (H) 0.61 - 1.24 mg/dL   Calcium  8.9 8.9 - 10.3 mg/dL   Total Protein 5.6 (L) 6.5 - 8.1 g/dL   Albumin  2.6 (L) 3.5 - 5.0 g/dL   AST 25 15 - 41 U/L   ALT 8 0 - 44 U/L   Alkaline Phosphatase 50 38 - 126 U/L   Total Bilirubin 1.0 0.0 - 1.2 mg/dL   GFR, Estimated 35 (L) >60 mL/min    Comment: (NOTE) Calculated using the CKD-EPI Creatinine Equation (2021)    Anion gap 13 5 - 15    Comment: Performed at [facility], [address], [place], [place] [address]  Protime-INR     Status: Abnormal   Collection Time: 04/13/24 10:47 AM  Result Value Ref Range   Prothrombin Time 19.2 (H) 11.4 -  15.2 seconds   INR 1.6 (H) 0.8 - 1.2    Comment: (NOTE) INR goal varies based on device and disease states. Performed at [facility], [name], [place], [place] [address]   APTT     Status: Abnormal   Collection Time: 04/13/24 10:47 AM  Result  Value Ref Range   aPTT 47 (H) 24 - 36 seconds    Comment:        IF BASELINE aPTT IS ELEVATED, SUGGEST PATIENT RISK ASSESSMENT BE USED TO DETERMINE APPROPRIATE ANTICOAGULANT THERAPY. Performed at [facility], [address], [place], [place] [address]   MRSA Next Gen by PCR, Nasal     Status: None   Collection Time: 04/13/24 11:35 AM   Specimen: Nasal Mucosa; Nasal Swab  Result Value Ref Range   MRSA by PCR Next Gen NOT DETECTED NOT DETECTED    Comment: (NOTE) The GeneXpert MRSA Assay (FDA approved for NASAL specimens only), is one component of a comprehensive MRSA colonization surveillance program. It is not intended to diagnose MRSA infection nor to guide or monitor treatment for MRSA infections. Test performance is not FDA approved in patients less than 41 years old. Performed at [facility], [address] [place], [place] [address]   Glucose, capillary     Status: Abnormal   Collection Time: 04/13/24 11:59 AM  Result Value Ref Range   Glucose-Capillary 107 (H) 70 - 99 mg/dL    Comment: Glucose reference range applies only to samples taken after fasting for at least 8 hours.   Comment 1 Notify RN    Comment 2 Document in Chart   Blood gas, arterial     Status: Abnormal   Collection Time: 04/13/24  1:19 PM  Result Value Ref Range   Delivery systems NASAL CANNULA    pH, Arterial 7.44 7.35 - 7.45   pCO2 arterial 24 (L) 32 - 48 mmHg   pO2, Arterial 118 (H) 83 - 108 mmHg   Bicarbonate 16.7 (L) 20.0 - 28.0 mmol/L   Acid-base deficit 6.1 (H) 0.0 - 2.0 mmol/L   O2 Saturation 100 %   Patient temperature 37.3    Collection site LEFT BRACHIAL    Drawn by [id number]      Comment: Performed at [facility], [address], [place], [place] [address]  Lactic acid, plasma     Status: Abnormal   Collection Time: 04/13/24  2:14 PM  Result Value Ref Range   Lactic Acid, Venous 5.4 (HH) 0.5 - 1.9 mmol/L    Comment: CRITICAL RESULT CALLED TO, READ BACK BY AND VERIFIED WITH DOMAN, J RN AT 1517 ON 04/13/2024 BY Anibal Kent Performed at [facility], [address] [place], [place] [address]   Glucose, capillary     Status: None   Collection Time: 04/13/24  3:12 PM  Result Value Ref Range   Glucose-Capillary 98 70 - 99 mg/dL    Comment: Glucose reference range applies only to samples taken after fasting for at least 8 hours.  Comprehensive metabolic panel     Status: Abnormal   Collection Time: 04/13/24  6:35 PM  Result Value Ref Range   Sodium 133 (L) 135 - 145 mmol/L   Potassium 3.8 3.5 - 5.1 mmol/L   Chloride 103 98 - 111 mmol/L   CO2 19 (L) 22 - 32 mmol/L   Glucose, Bld 129 (H) 70 - 99 mg/dL    Comment: Glucose reference range applies only to samples taken after fasting for at least 8 hours.   BUN 54 (H) 8 - 23 mg/dL   Creatinine, Ser 1.30 (H) 0.61 - 1.24 mg/dL   Calcium  8.4 (L) 8.9 - 10.3 mg/dL   Total Protein 5.4 (L) 6.5 - 8.1 g/dL   Albumin  2.7 (L) 3.5 -  5.0 g/dL   AST 29 15 - 41 U/L   ALT 9 0 - 44 U/L   Alkaline Phosphatase 41 38 - 126 U/L   Total Bilirubin 1.6 (H) 0.0 - 1.2 mg/dL   GFR, Estimated 37 (L) >60 mL/min    Comment: (NOTE) Calculated using the CKD-EPI Creatinine Equation (2021)    Anion gap 11 5 - 15    Comment: Performed at  [facility], [address], [place], [place] [address]  Troponin I (High Sensitivity)     Status: Abnormal   Collection Time: 04/13/24  6:35 PM  Result Value Ref Range   Troponin I (High Sensitivity) 38 (H) <18 ng/L    Comment: (NOTE) Elevated high sensitivity troponin I (hsTnI) values and significant  changes across serial measurements may suggest ACS but  many other  chronic and acute conditions are known to elevate hsTnI results.  Refer to the "Links" section for chest pain algorithms and additional  guidance. Performed at [facility], [address], [place], [place] [address]   Brain natriuretic peptide     Status: Abnormal   Collection Time: 04/13/24  6:35 PM  Result Value Ref Range   B Natriuretic Peptide 127.6 (H) 0.0 - 100.0 pg/mL    Comment: Performed at [facility], [address], [place], [place] [address]  CBC with Differential/Platelet     Status: Abnormal   Collection Time: 04/13/24  6:35 PM  Result Value Ref Range   WBC 10.8 (H) 4.0 - 10.5 K/uL   RBC 3.04 (L) 4.22 - 5.81 MIL/uL   Hemoglobin 8.5 (L) 13.0 - 17.0 g/dL    Comment: REPEATED TO VERIFY DELTA CHECK NOTED    HCT 26.4 (L) 39.0 - 52.0 %   MCV 86.8 80.0 - 100.0 fL   MCH 28.0 26.0 - 34.0 pg   MCHC 32.2 30.0 - 36.0 g/dL   RDW 21.3 08.6 - 57.8 %   Platelets 152 150 - 400 K/uL   nRBC 0.0 0.0 - 0.2 %   Neutrophils Relative % 69 %   Neutro Abs 8.5 (H) 1.7 - 7.7 K/uL   Band Neutrophils 10 %   Lymphocytes Relative 7 %   Lymphs Abs 0.8 0.7 - 4.0 K/uL   Monocytes Relative 8 %   Monocytes Absolute 0.9 0.1 - 1.0 K/uL   Eosinophils Relative 0 %   Eosinophils Absolute 0.0 0.0 - 0.5 K/uL   Basophils Relative 0 %   Basophils Absolute 0.0 0.0 - 0.1 K/uL   WBC Morphology DOHLE BODIES     Comment: VACUOLATED NEUTROPHILS   RBC Morphology MORPHOLOGY UNREMARKABLE    Smear Review MORPHOLOGY UNREMARKABLE    Metamyelocytes Relative 6 %   Abs Immature Granulocytes 0.60 (H) 0.00 - 0.07 K/uL    Comment: Performed at [facility], [address], [place], [place] [address]  Troponin I (High Sensitivity)     Status: Abnormal   Collection Time: 04/13/24  8:35 PM  Result Value Ref Range   Troponin I (High Sensitivity) 33 (H) <18 ng/L    Comment: (NOTE) Elevated high sensitivity troponin I (hsTnI) values and significant   changes across serial measurements may suggest ACS but many other  chronic and acute conditions are known to elevate hsTnI results.  Refer to the "Links" section for chest pain algorithms and additional  guidance. Performed at [facility], [address] [place], [place] [address]   Glucose, capillary  Status: Abnormal   Collection Time: 04/14/24 12:24 AM  Result Value Ref Range   Glucose-Capillary 142 (H) 70 - 99 mg/dL    Comment: Glucose reference range applies only to samples taken after fasting for at least 8 hours.  Lactic acid, plasma     Status: Abnormal   Collection Time: 04/14/24  6:01 AM  Result Value Ref Range   Lactic Acid, Venous 2.1 (HH) 0.5 - 1.9 mmol/L    Comment: CRITICAL VALUE NOTED. VALUE IS CONSISTENT WITH PREVIOUSLY REPORTED/CALLED VALUE Performed at [facility], [address], [place], [place] [address]   Glucose, capillary     Status: Abnormal   Collection Time: 04/14/24  6:05 AM  Result Value Ref Range   Glucose-Capillary 141 (H) 70 - 99 mg/dL    Comment: Glucose reference range applies only to samples taken after fasting for at least 8 hours.    Imaging / Studies: CT Angio Chest Pulmonary Embolism (PE) W or WO Contrast Result Date: 04/14/2024 EXAM: CTA of the Chest with contrast for PE 04/14/2024 04:12:39 AM TECHNIQUE: CTA of the chest was performed after the administration of intravenous contrast. Multiplanar reformatted images are provided for review. MIP images are provided for review. Automated exposure control, iterative reconstruction, and/or weight based adjustment of the mA/kV was utilized to reduce the radiation dose to as low as reasonably achievable. COMPARISON: CT chest 12/06/2023 at [facility] urology specialists. CLINICAL HISTORY: Pulmonary embolism (PE) suspected, low to intermediate prob, positive D-dimer. Post-op pain, SOB, eval for perf; Bladder cancer. FINDINGS: PULMONARY ARTERIES: Pulmonary arteries  are adequately opacified for evaluation. No pulmonary embolism. Main pulmonary artery is normal in caliber. MEDIASTINUM: The heart and pericardium demonstrate no acute abnormality. There is no acute abnormality of the thoracic aorta. Atherosclerotic changes are again seen within the coronary arteries. LYMPH NODES: Previously noted right hilar nodularity is less well seen on today's study and may be improved. LUNGS AND PLEURA: No new or enlarging pulmonary nodules are present. A moderate right-sided pleural effusion is present with partial collapse of the right lower lobe. UPPER ABDOMEN: Gastric tube is in place. SOFT TISSUES AND BONES: Fused anterior osteophytes are present throughout the thoracic spine. No focal osseous lesions are present. IMPRESSION: 1. No evidence of pulmonary embolism. 2. Moderate right-sided pleural effusion with partial collapse of the right lower lobe. Electronically signed by: Audree Leas MD 04/14/2024 04:26 AM EDT RP Workstation: [id number]   DG Abd Portable 1V Result Date: 04/13/2024 CLINICAL DATA:  Tachypnea EXAM: PORTABLE ABDOMEN - 1 VIEW COMPARISON:  None Available. FINDINGS: Multiple dilated centrally located small bowel loops in the mid abdomen could correlate with early or partial small bowel obstruction. Drain catheter peritoneal catheter in the left lower quadrant region of the abdomen. Subcutaneous air and edema in the left side of the abdominal wall. IMPRESSION: Multiple dilated centrally located small bowel loops in the mid abdomen could correlate with early or partial small bowel obstruction. Electronically Signed   By: Fredrich Jefferson M.D.   On: 04/13/2024 08:19   DG Abd Portable 1V Result Date: 04/13/2024 CLINICAL DATA:  NG tube placement EXAM: PORTABLE ABDOMEN - 1 VIEW COMPARISON:  None Available. FINDINGS: the tip of the nasogastric tube is located within the body fundus stomach in good position. IMPRESSION: Enteric tube in the stomach. Electronically Signed    By: Fredrich Jefferson M.D.   On: 04/13/2024 08:15   US  EKG SITE RITE Result Date: 04/13/2024 If Site Rite image not attached, placement could not  be confirmed due to current cardiac rhythm.  DG CHEST PORT 1 VIEW Result Date: 04/13/2024 CLINICAL DATA:  Tachypnea EXAM: PORTABLE CHEST 1 VIEW COMPARISON:  12/06/2018 FINDINGS: The cardiomediastinal contours are unremarkable. Lung volumes are low. There are small bilateral pleural effusions. Mild subsegmental atelectasis within the lung bases. No consolidative change. IMPRESSION: Low lung volumes and small bilateral pleural effusions. Electronically Signed   By: Kimberley Penman M.D.   On: 04/13/2024 07:23    Medications / Allergies: per chart  Antibiotics: Anti-infectives (From admission, onward)    Start     Dose/Rate Route Frequency Ordered Stop   04/14/24 1600  vancomycin  (VANCOCIN ) IVPB 1000 mg/200 mL premix        1,000 mg 200 mL/hr over 60 Minutes Intravenous Every 24 hours 04/13/24 1420     04/14/24 1200  vancomycin  (VANCOCIN ) IVPB 1000 mg/200 mL premix  Status:  Discontinued        1,000 mg 200 mL/hr over 60 Minutes Intravenous Every 24 hours 04/13/24 1053 04/13/24 1109   04/13/24 2200  ceFEPIme  (MAXIPIME ) 2 g in sodium chloride  0.9 % 100 mL IVPB  Status:  Discontinued        2 g 200 mL/hr over 30 Minutes Intravenous Every 12 hours 04/13/24 1036 04/13/24 1109   04/13/24 2200  ceFEPIme  (MAXIPIME ) 2 g in sodium chloride  0.9 % 100 mL IVPB        2 g 200 mL/hr over 30 Minutes Intravenous Every 12 hours 04/13/24 1425     04/13/24 1500  vancomycin  (VANCOCIN ) 2,000 mg in sodium chloride  0.9 % 500 mL IVPB        2,000 mg 260 mL/hr over 120 Minutes Intravenous  Once 04/13/24 1409 04/13/24 1850   04/13/24 1500  ceFEPIme  (MAXIPIME ) 2 g in sodium chloride  0.9 % 100 mL IVPB  Status:  Discontinued        2 g 200 mL/hr over 30 Minutes Intravenous Every 12 hours 04/13/24 1420 04/13/24 1425   04/13/24 1500  metroNIDAZOLE  (FLAGYL ) IVPB 500 mg        500  mg 100 mL/hr over 60 Minutes Intravenous Every 12 hours 04/13/24 1420     04/13/24 1200  vancomycin  (VANCOCIN ) 2,000 mg in sodium chloride  0.9 % 500 mL IVPB  Status:  Discontinued        2,000 mg 260 mL/hr over 120 Minutes Intravenous  Once 04/13/24 1052 04/13/24 1121   04/13/24 1100  metroNIDAZOLE  (FLAGYL ) IVPB 500 mg  Status:  Discontinued        500 mg 100 mL/hr over 60 Minutes Intravenous 2 times daily 04/13/24 1007 04/13/24 1109   04/13/24 1100  vancomycin  (VANCOCIN ) IVPB 1000 mg/200 mL premix  Status:  Discontinued        1,000 mg 200 mL/hr over 60 Minutes Intravenous  Once 04/13/24 1007 04/13/24 1020   04/13/24 1015  ceFEPIme  (MAXIPIME ) 2 g in sodium chloride  0.9 % 100 mL IVPB        2 g 200 mL/hr over 30 Minutes Intravenous STAT 04/13/24 1007 04/13/24 1107   04/10/24 2200  ceFAZolin  (ANCEF ) IVPB 2g/100 mL premix        2 g 200 mL/hr over 30 Minutes Intravenous Every 8 hours 04/10/24 1833 04/11/24 0531   04/10/24 0600  ceFAZolin  (ANCEF ) IVPB 2g/100 mL premix        2 g 200 mL/hr over 30 Minutes Intravenous On call to O.R. 04/10/24 0533 04/10/24 1526         Note:  Portions of this report may have been transcribed using voice recognition software. Every effort was made to ensure accuracy; however, inadvertent computerized transcription errors may be present.   Any transcriptional errors that result from this process are unintentional.    Eddye Goodie, MD, FACS, MASCRS Esophageal, Gastrointestinal & Colorectal Surgery Robotic and Minimally Invasive Surgery  [facility] [address], [address] [place], [place] [address] [phone] Fax [phone] Main  CONTACT INFORMATION: Weekday (9AM-5PM): Call [facility] main office at [phone] Weeknight (5PM-9AM) or Weekend/Holiday: Check EPIC "Web Links" tab & use "AMION" (password " [email or web address]") for General Surgery [facility] coverage  Please, DO NOT use SecureChat  (it is not  reliable communication to reach operating surgeons & will lead to a delay in care).   Epic staff messaging available for outptient concerns needing 1-2 business day response.      04/14/2024  7:03 AM

## 2024-04-14 NOTE — Progress Notes (Signed)
 Arterial line at this time has good waveform, draws and flushes blood, insertion site WNL (paced by CCM).

## 2024-04-14 NOTE — Progress Notes (Signed)
 Hospitalists will sign off, patient on pressors with PCCM following (discussed with CCM).  Please let us  know when we can be of further assistance. Donnetta Gains, MD

## 2024-04-14 NOTE — Progress Notes (Signed)
 PHARMACY - TOTAL PARENTERAL NUTRITION CONSULT NOTE   Indication: Prolonged ileus  Patient Measurements: Height: 5\' 11"  (180.3 cm) Weight: 108.9 kg (240 lb) IBW/kg (Calculated) : 75.3 TPN AdjBW (KG): 83.7 Body mass index is 33.47 kg/m.  Assessment: Pt is a 21 yoM who underwent hernia repair and urostomy revision on 04/10/24. Pt developed post-op ileus. Pharmacy consulted to manage TPN.   Glucose / Insulin : Hx DM on metformin PTA. A1c 5.3% -BG goal < 150. Range 98-154 (4 units SSI/24 hrs) Electrolytes: Na 134 low but slightly up . Phosphorus is 1.9, low  -Corr Ca is 9.2 WNL  - K has drifted down to 3.6, WNL but trending down  - All other electrolytes WNL Renal: SCr 1.27, improving. Baseline ~1?, BUN 46, improving  Hepatic: WNL Intake / Output; MIVF:  -UOP: 1575 mL, NG 1600 mL.  drain: 115 mL, no stool -mIVF: None, Per Sidney with Carlon Chester with CCM, no need to resume due to large pleural effusion  GI Imaging: - 6/3: Right lower quadrant small bowel loop with mural defect and adjacent extraluminal gas identified, which is concerning for underlying small bowel perforation. GI Surgeries / Procedures:  -5/30: Hernia repair, lysis of adhesions, urostomy revision  Central access: PICC ordered TPN start date: 6/2 pending PICC placement  Nutritional Goals: Pending RD assessment Goal TPN rate is 90 mL/hr (provides 101.5 g of protein and 2047 kcals per day)  RD Assessment: ( 6/2)  Estimated Needs Total Energy Estimated Needs: 1950-2150 Total Protein Estimated Needs: 95-110g Total Fluid Estimated Needs: 2.1L/day  Current Nutrition:  NPO and TPN  Plan:  Now Potassium phosphate  30 mmol x 1   At 1800: Increase TPN to 65  mL/hr Electrolytes in TPN:   Na 150 mEq/L,  K Inc to 40 mEq/L,  Ca 5 mEq/L,  Mg 5 mEq/L,  Phos inc to 20 mmol/L.  Cl:Ac 1:1 Add standard MVI and trace elements to TPN Continue Moderate q6h SSI and adjust as needed  Thiamine 100 mg IV daily x 6 days through 6/7   MIVF stopped by TRH on 6/3.  Monitor TPN labs on Mon/Thurs, check electrolytes x3 days.     Van Gelinas, PharmD, BCPS 04/14/2024 9:46 AM

## 2024-04-14 NOTE — Progress Notes (Signed)
 Echocardiogram 2D Echocardiogram has been performed.  Farley Honer, RDCS 04/14/2024, 12:09 PM

## 2024-04-15 ENCOUNTER — Inpatient Hospital Stay (HOSPITAL_COMMUNITY)

## 2024-04-15 DIAGNOSIS — R6521 Severe sepsis with septic shock: Secondary | ICD-10-CM | POA: Diagnosis not present

## 2024-04-15 DIAGNOSIS — A419 Sepsis, unspecified organism: Secondary | ICD-10-CM | POA: Diagnosis not present

## 2024-04-15 DIAGNOSIS — K430 Incisional hernia with obstruction, without gangrene: Secondary | ICD-10-CM | POA: Diagnosis not present

## 2024-04-15 DIAGNOSIS — J9601 Acute respiratory failure with hypoxia: Secondary | ICD-10-CM | POA: Diagnosis not present

## 2024-04-15 LAB — CBC
HCT: 26.3 % — ABNORMAL LOW (ref 39.0–52.0)
Hemoglobin: 8.6 g/dL — ABNORMAL LOW (ref 13.0–17.0)
MCH: 29 pg (ref 26.0–34.0)
MCHC: 32.7 g/dL (ref 30.0–36.0)
MCV: 88.6 fL (ref 80.0–100.0)
Platelets: 119 10*3/uL — ABNORMAL LOW (ref 150–400)
RBC: 2.97 MIL/uL — ABNORMAL LOW (ref 4.22–5.81)
RDW: 14.6 % (ref 11.5–15.5)
WBC: 7.2 10*3/uL (ref 4.0–10.5)
nRBC: 0 % (ref 0.0–0.2)

## 2024-04-15 LAB — GLUCOSE, CAPILLARY
Glucose-Capillary: 126 mg/dL — ABNORMAL HIGH (ref 70–99)
Glucose-Capillary: 137 mg/dL — ABNORMAL HIGH (ref 70–99)

## 2024-04-15 LAB — PHOSPHORUS: Phosphorus: 2.3 mg/dL — ABNORMAL LOW (ref 2.5–4.6)

## 2024-04-15 LAB — BPAM RBC
Blood Product Expiration Date: 202507022359
ISSUE DATE / TIME: 202506031147
Unit Type and Rh: 6200

## 2024-04-15 LAB — BASIC METABOLIC PANEL WITH GFR
Anion gap: 9 (ref 5–15)
BUN: 41 mg/dL — ABNORMAL HIGH (ref 8–23)
CO2: 23 mmol/L (ref 22–32)
Calcium: 8.2 mg/dL — ABNORMAL LOW (ref 8.9–10.3)
Chloride: 108 mmol/L (ref 98–111)
Creatinine, Ser: 1.19 mg/dL (ref 0.61–1.24)
GFR, Estimated: >60 mL/min (ref >60)
Glucose, Bld: 134 mg/dL — ABNORMAL HIGH (ref 70–99)
Potassium: 3.3 mmol/L — ABNORMAL LOW (ref 3.5–5.1)
Sodium: 140 mmol/L (ref 135–145)

## 2024-04-15 LAB — TYPE AND SCREEN
ABO/RH(D): AB POS
Antibody Screen: NEGATIVE
Unit division: 0

## 2024-04-15 LAB — MAGNESIUM: Magnesium: 2.6 mg/dL — ABNORMAL HIGH (ref 1.7–2.4)

## 2024-04-15 MED ORDER — LACTATED RINGERS IV BOLUS: 1000.0000 mL | Freq: Three times a day (TID) | INTRAVENOUS | Status: AC | PRN

## 2024-04-15 MED ORDER — ENOXAPARIN SODIUM 40 MG/0.4ML IJ SOSY
40.0000 mg | PREFILLED_SYRINGE | Freq: Every day | INTRAMUSCULAR | Status: DC
  Administered 2024-04-15 – 2024-04-16 (×2): 40 mg via SUBCUTANEOUS
  Filled 2024-04-15 (×2): qty 0.4

## 2024-04-15 MED ORDER — POTASSIUM CHLORIDE 10 MEQ/100ML IV SOLN
10.0000 meq | INTRAVENOUS | Status: AC
  Administered 2024-04-15 (×2): 10 meq via INTRAVENOUS
  Filled 2024-04-15: qty 100

## 2024-04-15 MED ORDER — METOPROLOL TARTRATE 5 MG/5ML IV SOLN: 2.5000 mg | INTRAVENOUS | Status: DC | PRN

## 2024-04-15 MED ORDER — BISACODYL 10 MG RE SUPP
10.0000 mg | Freq: Every day | RECTAL | Status: DC
  Administered 2024-04-16 – 2024-04-18 (×3): 10 mg via RECTAL
  Filled 2024-04-15 (×5): qty 1

## 2024-04-15 MED ORDER — SIMETHICONE 80 MG PO CHEW
80.0000 mg | CHEWABLE_TABLET | Freq: Four times a day (QID) | ORAL | Status: AC
  Administered 2024-04-18: 80 mg via ORAL
  Filled 2024-04-15 (×5): qty 1

## 2024-04-15 MED ORDER — POTASSIUM PHOSPHATES 15 MMOLE/5ML IV SOLN
15.0000 mmol | Freq: Once | INTRAVENOUS | Status: AC
  Administered 2024-04-15: 15 mmol via INTRAVENOUS
  Filled 2024-04-15: qty 5

## 2024-04-15 MED ORDER — METOCLOPRAMIDE HCL 5 MG/ML IJ SOLN
5.0000 mg | Freq: Three times a day (TID) | INTRAMUSCULAR | Status: DC
  Administered 2024-04-15 – 2024-04-16 (×3): 5 mg via INTRAVENOUS
  Filled 2024-04-15 (×3): qty 2

## 2024-04-15 MED ORDER — K PHOS MONO-SOD PHOS DI & MONO 155-852-130 MG PO TABS: 500.0000 mg | ORAL_TABLET | Freq: Two times a day (BID) | ORAL | Status: DC

## 2024-04-15 MED ORDER — TRAVASOL 10 % IV SOLN
INTRAVENOUS | Status: AC
  Filled 2024-04-15: qty 1015.2

## 2024-04-15 MED ORDER — FUROSEMIDE 10 MG/ML IJ SOLN
40.0000 mg | Freq: Once | INTRAMUSCULAR | Status: AC
  Administered 2024-04-15: 40 mg via INTRAVENOUS
  Filled 2024-04-15: qty 4

## 2024-04-15 NOTE — Progress Notes (Signed)
 PHARMACY - TOTAL PARENTERAL NUTRITION CONSULT NOTE   Indication: Prolonged ileus  Patient Measurements: Height: 5\' 11"  (180.3 cm) Weight: 108.9 kg (240 lb) IBW/kg (Calculated) : 75.3 TPN AdjBW (KG): 83.7 Body mass index is 33.47 kg/m.  Assessment: Pt is a 72 yoM who underwent hernia repair and urostomy revision on 04/10/24. Pt developed post-op ileus. Pharmacy consulted to manage TPN.   Glucose / Insulin : Hx DM on metformin PTA. A1c 5.3% -BG within goal < 150 (6 units SSI/24 hrs) Electrolytes:  -Na improved -K 3.3, phos 2.3 - replacing -Mg 2.3 - slightly elevated -Corr Ca 9.2 WNL  Renal: SCr 1.19, improving; BUN 41 Hepatic: WNL Intake / Output; MIVF:  -UOP > 3 L yesterday -NG 1600 mL overnight prior to pt removing -Drain 205 mL -LBM 6/3 GI Imaging: -6/3: Right lower quadrant small bowel loop with mural defect and adjacent extraluminal gas identified, which is concerning for underlying small bowel perforation. GI Surgeries / Procedures:  -5/30: Hernia repair, lysis of adhesions, urostomy revision  Central access: PICC TPN start date: 6/2   Nutritional Goals:  Goal TPN rate is 90 mL/hr (provides 101.5 g of protein and 2047 kcals per day)  RD Assessment: 6/2  Estimated Needs Total Energy Estimated Needs: 1950-2150 Total Protein Estimated Needs: 95-110g Total Fluid Estimated Needs: 2.1L/day  Current Nutrition:  TPN CLD ordered  Plan:  K phos  15 mmol IV x 1 + KCl 10 mEq IV x 2 K phos  neutral PO ordered by MD - pt is not receiving any PO meds at this time so will push to tomorrow pending ability to take PO meds/diet At 1800, increase TPN to goal 90 mL/hr Electrolytes in TPN: Na 50 mEq/L, K 40 mEq/L, Ca 5 mEq/L, Mg 5 mEq/L, Phos 20 mmol/L; Cl:Ac 1:1 Add standard MVI and trace elements to TPN Continue Moderate q6h SSI and adjust as needed  Thiamine 100 mg IV daily x 6 days through 6/7  Monitor TPN labs on Mon/Thurs, continue daily while advancing and replacing  electrolytes  Lolita Rise, PharmD, BCPS Clinical Pharmacist 04/15/2024 10:20 AM

## 2024-04-15 NOTE — Evaluation (Signed)
 SLP Cancellation Note  Patient Details Name: Kristopher Thompson MRN: [id number] DOB: 1951-08-06   Cancelled treatment:       Reason Eval/Treat Not Completed: Other (comment) (Order for swallow evaluation received, messaged RN, Myrtie Atkinson, after reviewed chart. Pt with only able to have few ice chips/sips and may require NG replacement if has more issues. Will follow up next day - RN agreed. Thanks for this consult.)  Maudie Sorrow, MS [facility] SLP Acute Rehab Services Office [phone]   Chantal Comment 04/15/2024, 3:27 PM

## 2024-04-15 NOTE — Plan of Care (Signed)
   Problem: Fluid Volume: Goal: Hemodynamic stability will improve Outcome: Progressing

## 2024-04-15 NOTE — Progress Notes (Addendum)
 Paged by for ICU nurse Myrtie Atkinson that patient wanted to transfer to [facility] in [place].  I came up in between cases  Apparently patient spit up a small bit of emesis again.  Per discussion this morning ICU nurse recommended NG tube replacement as I did ordered.  Patient refused.  Wife at bedside.  Apparently discussed with family and one of the sons wondered if transferring to [facility] would make the patient more happy.  Patient is calm.  Mumbling somewhat.  Claims to be hard of hearing, so I did talk louder.  Explained the reasoning for an NG tube to avoid an aspiration event or worsening bowel distention or leak/perforation.  Again went over the hospital course in detail including the fact that he is made much improvements in the past 48 hours  Reassuring that things are getting under control.  I think the patient just frustrated with the idea of having the NG tube back.  After long discussion with the patient, his wife, and ICU nurse at the bedside; the patient compromised that  he is willing to have the NG tube placed back and if he spits up/vomits again.  He confirmed it twice in the conversation.  I will keep the patient just on sips and ice chips only. Scheduled simethicone .  Rectal suppositories.  Place him on some scheduled metoclopramide .  In these people with ileus, I also do a trial of IV erythromycin; but, there is concern about his QT interval too long to tolerate that, so we will hold off.   See if things open up.  I recommend the patient & especially his wife discuss with family.  I am guarded against honoring the wishes of the son if the patient's wife wants him to stay.  She noted she was happy with the ICU care and my surgical care at this point.  Patient seemed to reiterate that as well.  I noted that if they wish to transfer or get care elsewhere, that is all means they are right, and I am obligated to honor that if they want.  I noted I am not a huge fan of transferring people from ICU  to ICU unless there is something that institution can do that we do not.  I do not think that we have maximized his ICU care, and it is gotten easier and simpler in the past few days.  I think the patient was just feeling frustrated and seems somewhat more reassured now.  Given the patient's mumbling and some occasional confusion, I had ordered speech therapy to evaluate to make sure he is not having swallowing problems.  I wonder if he would benefit from a CAT scan head.  Does not seem to really have any focal deficits.  I suspect this is all more some mild confusion and encephalopathy with everything he has been through especially the past few days.  He is better today than before.  D/w NP Bowser & Dr Baldwin Levee with critical care.  They favor most likely some mild metabolic encephalopathy/ICU psychosis confusion given loss of sleep pattern but not unreasonable to consider CT of the head to rule out any stroke given some of his dysarthria.  I also cautioned that it would be very hard to get the patient transferred to [facility] right now as beds are tight

## 2024-04-15 NOTE — Progress Notes (Signed)
 NAME:  Kristopher Thompson, MRN:  [id number], DOB:  14-Jan-1951, LOS: 5 ADMISSION DATE:  04/10/2024, CONSULTATION DATE:  04/13/2024 REFERRING MD:  Dr Bonita Bussing, CHIEF COMPLAINT: Sepsis  History of Present Illness:  S/p repair of incisional/parastomal and ventral hernias, left inguinal incarcerated , abdominal hernias, lysis of adhesions, urostomy revision Day 1 postoperatively  Transferred to the ICU secondary to tachycardia, tachypnea  History of bladder cancer, prostate cancer, diastolic heart failure, diabetes, right bundle branch block, multiple abdominal surgeries  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   moderate atypia - right low back   Atypical nevus 04/04/2007   moderate to marked - right upper back (wider shave)   Atypical nevus 04/04/2007   moderate atypia - center chest (wider shave)   Atypical nevus 04/04/2007   slight atypia - right thigh   Atypical nevus 11/29/2011   mild atypia - center upper back   Atypical nevus 11/29/2011   mild atypia - center chest   Bladder cancer (HCC) dx 07/2017   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 denies   Sleep apnea    cpap   Squamous cell carcinoma of skin 05/22/2013   left cheek - CX3 + 5FU   Wears glasses    Significant Hospital Events: Including procedures, antibiotic start  and stop dates in addition to other pertinent events   04/10/2024-laparoscopic surgery 04/13/24 CCM consult. Incr NE. Art line  6/3 weaning NE. Gently diuresed w 3L off  6/4 off NE  Interim History / Subjective:  Pulled out NGT overnight. Off pressors  Has had Bms   Wants to use bedpan   Objective    Blood pressure (!) 136/53, pulse (!) 101, temperature 98.2 F (36.8 C), temperature source Axillary, resp. rate (!) 30, height 5\' 11"  (1.803 m), weight 108.9 kg, SpO2 100%.        Intake/Output Summary (Last 24 hours) at 04/15/2024 0949 Last data filed at 04/15/2024 0800 Gross per 24 hour  Intake 5930.17 ml  Output 4925 ml  Net 1005.17 ml   Filed Weights   04/10/24 0536  Weight: 108.9 kg    Examination: General: chronically and acutely ill older adult M NAD  HENT: NCAT  Lungs: Even and unlabored. Lung sounds obscured by pt talking unfortunately.  Cardiovascular: tachy regular cap refill brisk  Abdomen: soft ndnt  Extremities: no acute joint deformity. Some trace Ble edema  Neuro: Awake a bit lethargic oriented x3  GU: urostomy yellow urine   Resolved problem list   Assessment and Plan    Incarcerated abd wall hernia s/p LOA hernia repairs urostomy ileal conduit revision Post op ileus, improving   Shock -- possible intraabd infection, improved shock  -CT a/p 6/3 c/f possible  small bowel perf + possible small bowel ileus + possible surgical incision dehiscence + a small fluid collection lower anterior abd wall (L paramidline). CCS and rad have discussed these findings. Clinically pt continuing to improve w abx/supportive care arguing against a perf.  P -cont abx x5d, think we could probably dc vanc but ok to keep for now  -Off pressors, will dc order.  Keeping art line 6/4 as we start to diurese to make sure we remain stable  -post op / diet per CCS: -SLP to see and if passes, trial clears as pt has pulled out his NGT and is refusing replacement  -will cont TPN   Acute  hypoxic resp failure  R pleural effusion  OSA on CPAP P -will diurese 6/4  -IS, pulm hygiene  -PRN CXR -Goal >92  Diastolic HF -diuresing 6/4   HypoK HypoPhos HyperMag  P - Kphos already ordered 6/4  + pharmD managing TPN  -would like K 4 Mag >2 w his ileus -- will talk w pharmd and make sure we dont need to escalate his K replacement. 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 Holding acutely.     Best Practice (right click and "Reselect all SmartList Selections" daily)   Diet/type: NPO -- possible trial of clears v dys1 6/4  DVT prophylaxis SCD lovenox   Pressure ulcer(s): N/A GI prophylaxis: N/A Lines: Central line,PICC art line  Foley:  Yes, and it is still needed Code Status:  full code Last date of multidisciplinary goals of care discussion [d/w family at bedside 6/4  Labs   CBC: Recent Labs  Lab 04/12/24 0437 04/13/24 0421 04/13/24 1835 04/14/24 0941 04/14/24 1615 04/15/24 0444  WBC 17.5* 13.3* 10.8* 7.9  --  7.2  NEUTROABS  --   --  8.5*  --   --   --   HGB 11.2* 12.1* 8.5* 7.6* 8.6* 8.6*  HCT 34.4* 38.2* 26.4* 23.7* 26.1* 26.3*  MCV 86.4 90.3 86.8 84.9  --  88.6  PLT 181 166 152 127*  --  119*    Basic Metabolic Panel: Recent Labs  Lab 04/12/24 0437 04/13/24 0421 04/13/24 1047 04/13/24 1835 04/14/24 0601 04/14/24 1825 04/15/24 0444  NA 133*   < > 134* 133* 134* 137 140  K 5.1   < > 4.5 3.8 3.6 3.6 3.3*  CL 103   < > 102 103 105 105 108  CO2 22   < > 19* 19* 20* 20* 23  GLUCOSE 163*   < > 137* 129* 142* 105* 134*  BUN 40*   < > 50* 54* 46* 41* 41*  CREATININE 2.08*   < > 1.99* 1.91* 1.27* 1.25* 1.19   CALCIUM  8.5*   < > 8.9 8.4* 8.2* 8.1* 8.2*  MG 2.1  --   --   --  2.3  --  2.6*  PHOS 2.7  --   --   --  1.9*  --  2.3*   < > = values in this interval not displayed.   GFR: Estimated Creatinine Clearance: 71.4 mL/min (by C-G formula  based on SCr of 1.19 mg/dL). Recent Labs  Lab 04/13/24 0421 04/13/24 0713 04/13/24 1047 04/13/24 1414 04/13/24 1835 04/14/24 0601 04/14/24 0941 04/15/24 0444  WBC 13.3*  --   --   --  10.8*  --  7.9 7.2  LATICACIDVEN  --  3.8* 5.1* 5.4*  --  2.1*  --   --     Liver Function Tests: Recent Labs  Lab 04/13/24 1047 04/13/24 1835  AST 25 29  ALT 8 9  ALKPHOS 50 41  BILITOT 1.0 1.6*  PROT 5.6* 5.4*  ALBUMIN  2.6* 2.7*   No results for input(s): "LIPASE", "AMYLASE" in the last 168 hours. No results for input(s): "AMMONIA" in the last 168 hours.  ABG    Component Value Date/Time   PHART 7.44 04/13/2024 1319   PCO2ART 24 (L) 04/13/2024 1319   PO2ART 118 (H) 04/13/2024 1319   HCO3 16.7 (L) 04/13/2024 1319   TCO2 24 08/08/2017 1734   ACIDBASEDEF 6.1 (H) 04/13/2024 1319   O2SAT 100 04/13/2024 1319     Coagulation Profile: Recent Labs  Lab 04/13/24 1047 04/14/24 1141  INR 1.6* 1.6*    Cardiac Enzymes: No results for input(s): "CKTOTAL", "CKMB", "CKMBINDEX", "TROPONINI" in the last 168 hours.  HbA1C: Hgb A1c MFr Bld  Date/Time Value Ref Range Status  04/01/2024 10:50 AM 5.3 4.8 - 5.6 % Final    Comment:    (NOTE) Pre diabetes:          5.7%-6.4%  Diabetes:              >6.4%  Glycemic control for   <7.0% adults with diabetes     CBG: Recent Labs  Lab 04/14/24 0605 04/14/24 1210 04/14/24 1657 04/14/24 2343 04/15/24 0534  GLUCAP 141* 140* 109* 115* 126*    High MDM   Eston Hence MSN, AGACNP-BC [place] [facility] Amion for pager  04/15/2024, 9:49 AM

## 2024-04-15 NOTE — Progress Notes (Signed)
 The patient started to dry heave and was immediately receptive to inserting ng tube. NG tube inserted with immediate dark green emesis

## 2024-04-15 NOTE — Progress Notes (Addendum)
 This RN found pt to have pulled his NG tube out. This RN notified charge RN, Erin Havers, and she explained the importance of NG tube placement and the need for replacement. Pt then refused the replacement of NG tube. Pt not in apparent distress, breathing is regular, unlabored, and is resting comfortably at this time. On call surgeon, Dr. Afton Horse, made aware.

## 2024-04-15 NOTE — Progress Notes (Signed)
 After patient hooked up to suction 1400 mls of green bile output. Patient now resting comfortably.

## 2024-04-15 NOTE — Progress Notes (Signed)
 Patient called out asking for a menu. Went into room and patient stated I told him he had the right to refuse treatment so he wanted a menu so he could eat. Explained that patient had right to refuse NG tube replacement overnight, but I could not provide him with food. Advised that he should discuss with physicians that see him today. Wife reports patient also "talking crazy" and she didn't realize he'd pulled out his NGT. Informed her that this happened overnight while she was sleeping and I chose to not wake her since I knew she hadn't rested previously. Advised her that physicians may evaluate whether NG still needed and verbal reorientation preferable to alternatives of restraint/mitts/pharmaceutical interventions.

## 2024-04-15 NOTE — Progress Notes (Signed)
 04/15/2024  Kristopher Thompson [id number] [birth year]  CARE TEAM: PCP: Dudley Ghee, MD  Outpatient Care Team: Patient Care Team: Dudley Ghee, MD as PCP - General (Internal Medicine) Devon Fogo, MD (Inactive) as Consulting Physician (Dermatology) Lavina Pou, MD as Referring Physician (Pulmonary Disease) Candyce Champagne, MD as Consulting Physician (General Surgery) Florencio Hunting, MD as Consulting Physician (Urology)  Inpatient Treatment Team: Treatment Team:  Candyce Champagne, MD Pccm, Md, MD Etter Hermann., MD Iona Manis, RN Layvonne Primas, NT Pollet, Thane Finch, RN Lolita Rise, RPH Block, Bevin Bucks, RN   Problem List:   Principal Problem:   Incarcerated incisional hernia Active Problems:   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 08/08/2017   AKI (acute kidney injury) [facility])   Sinus tachycardia   Tachypnea   Acute respiratory insufficiency, postoperative   Sepsis due to undetermined organism (HCC)   Lactic acidosis   Class 2 obesity   04/10/2024  POST-OPERATIVE DIAGNOSIS:  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  Largest hernia was in the right lower quadrant around the ileal conduit urostomy.  16 x 12 cm region.  Incarcerated with over a foot of small bowel.  Midline next largest 9 x 8 cm incarcerated with numerous loops of small bowel.  Left lower quadrant with 15 cm of colon at old colostomy  site.  Another direct space hernia in the left lower quadrant and indirect hernia on the left lower quadrant.  No obvious hernias on the right side.   Component separation's done on both sides to try and get the largest midline and parastomal hernias close down with running serrated try to fix absorbable suture.  Broad underlay repair with 30 x 35 cm mesh transverse  Assessment [facility] Stay = 5 days) 5 Days Post-Op    Improved    Plan:  Shock resolved reassuring.  Suspect hemorrhagic more than septic etiology.  Try some diuresis as tolerated.  Reasonable to continue antibiotics for 5 days total until all cultures come back to make sure were not missing anything.  So far blood and urine cultures negative.  Patient with numerous bowel movements and lows NG tube output.  He pulled his NG tube out.  He is not nauseated or burping or belching.  Okay to start clear liquids and perhaps advance to dysphagia 1 diet as tolerated.  He is somewhat mobile he.  Normally pretty with it but I think it may be wise to have speech therapy evaluate to make sure he is not an aspiration risk at this point.  Which to be safe/thorough.  CAT scan raise concerns of bubble near bowel and possible bowel perforation but no fluid inflammation or major gas in the region.  Patient markedly improved and no longer in shock or pressors argues against that.  Drains in abdominal wall and peritoneum are serosanguineous and do not look like succus.  That is reassuring.  Follow closely.  If he markedly worsens then repeat CAT scan to rule out abscess and/or diagnostic laparoscopy/abdominal exploration.  Hopefully less  likely now but we will see.  CT of chest negative for any obvious pulmonary embolism or pneumonia.  Oxygen needs less.  Echocardiogram shows grade 1 diastolic dysfunction which I believe is his baseline.  Defer to critical care/internal medicine they feel further is needed aside for some monitor  diuresis.  ABLA on top of anemia of chronic disease improved with transfusion.  Follow.    TPN for postoperative ileus  Urostomy functioning with no evidence of any leak or other concern.  Perhaps will send abdominal wall drain for creatinine before pulling.  Keep in place for now.  -monitor electrolytes & replace as needed.  Keep K>4, Mg>2, Phos>3.  Some hypokalemia.  See if we can give some oral supplementation on top of the IV.  -Diabetes.  Sliding scale insulin .  Some mild hyperglycemia but nothing too severe.  Keep slightly elevated/on the sweet side to avoid severe hypoglycemic events.  -VTE prophylaxis- SCDs.  Anticoagulation prophyllaxis SQ as appropriate  -mobilize as tolerated to help recovery.  Enlist therapies in moderate/high risk patients as appropriate.  He seems rather tired and deconditioned and is at least a two-person assist.  Hopefully we can get him up to the chair more to help him.    I updated the patient's status to the the patient, his wife at bedside, nurse in room.  Recommendations were made.  Questions were answered.  They expressed understanding & appreciation.  -Disposition: He is going to be here a while       I reviewed nursing notes, last 24 h vitals and pain scores, last 48 h intake and output, last 24 h labs and trends, and last 24 h imaging results.  I have reviewed this patient's available data, including medical history, events of note, test results, etc as part of my evaluation.   A significant portion of that time was spent in counseling. Care during the described time interval was provided by me.  This care required high  level of medical decision making.  04/15/2024    Subjective: (Chief complaint)  Weaned off pressors.  Confused and pulled NG tube out.  Does not want back in.  Wife and ICU nurse at bedside.  Patient with soreness on his right flank but denies abdominal pain elsewhere.  Wants to eat.  Had loose bowel  movements  Objective:  Vital signs:  Vitals:   04/15/24 0325 04/15/24 0400 04/15/24 0500 04/15/24 0600  BP:  (!) 105/53 (!) 126/59 (!) 134/59  Pulse:  100 (!) 101   Resp:  (!) 27 (!) 25 16  Temp: 98.4 F (36.9 C)     TempSrc: Oral     SpO2:  100% 100%   Weight:      Height:        Last BM Date : 04/14/24  Intake/Output   Yesterday:  06/03 0701 - 06/04 0700 In: 5948.1 [I.V.:1465.2; Blood:350; IV Piggyback:4133] Out: 5409 [Urine:3125; Emesis/NG output:1600; Drains:280] This shift:  No intake/output data recorded.  Bowel function:  Flatus: YES  BM:  YES  Drains:  RLQ in hernia sac subcutaneous tissues of abdominal wall: serosanguineous LUQ intraperitoneal / pelvis:  serosanguineous   Physical Exam:  General: Pt awake/alert in no acute distress.  Looks tired but not sickly.  Joking but looks exhausted Eyes: PERRL, normal EOM.  Sclera clear.  No icterus Neuro: CN II-XII intact w/o focal sensory/motor deficits. Lymph: No head/neck/groin lymphadenopathy Psych:  No delerium/psychosis/paranoia.  Oriented x 4.  No confusion. HENT: Normocephalic, Mucus membranes  moist.  No thrush.  Mumbling a little bit when he talks. Neck: Supple, No tracheal deviation.  No obvious thyromegaly Chest: No pain to chest wall compression.  Good respiratory excursion.  No audible wheezing.  Lungs clear to auscultation without wheezing, rales, nor rhonchi CV:  Pulses intact.  Regular rhythm.  No major extremity edema MS: Normal AROM mjr joints.  No obvious deformity  Abdomen: Obese but Soft.  Nondistended.  Tenderness at right flank with significant ecchymosis and edema especially posteriorly and edema.  No crepitus or cellulitis.  Incisions clean dry intact.  No purulent drainage..  No evidence of guarding nor peritonitis.  No incarcerated hernias.   No cellulitis abscess or drainage.  No evidence of any succus or purulence. Right lower quadrant ileal conduit urostomy with some edema but viable  with clear lightly yellow-colored urine in bag.   Ext:   No deformity.  No mjr edema.  No cyanosis Skin: No petechiae / purpurea.  No major sores.  Warm and dry    Results:   Cultures: Recent Results (from the past 720 hours)  Culture, blood (Routine X 2) w Reflex to ID Panel     Status: None (Preliminary result)   Collection Time: 04/13/24  7:13 AM   Specimen: BLOOD  Result Value Ref Range Status   Specimen Description   Final    BLOOD BLOOD LEFT ARM AEROBIC BOTTLE ONLY ANAEROBIC BOTTLE ONLY Performed at [facility], [address] [place], [place] [address]    Special Requests   Final    BOTTLES DRAWN AEROBIC AND ANAEROBIC Blood Culture results may not be optimal due to an inadequate volume of blood received in culture bottles Performed at [facility], [address] [place], [place] [address]    Culture   Final    NO GROWTH < 24 HOURS Performed at [facility], [address], [place], [place] [address]    Report Status PENDING  Incomplete  Culture, blood (Routine X 2) w Reflex to ID Panel     Status: None (Preliminary result)   Collection Time: 04/13/24  7:20 AM   Specimen: BLOOD  Result Value Ref Range Status   Specimen Description   Final    BLOOD BLOOD RIGHT ARM AEROBIC BOTTLE ONLY ANAEROBIC BOTTLE ONLY Performed at [facility], [address] [place], [place] [address]    Special Requests   Final    BOTTLES DRAWN AEROBIC AND ANAEROBIC Blood Culture results may not be optimal due to an inadequate volume of blood received in culture bottles Performed at [facility], [address] [place], [place] [address]    Culture   Final    NO GROWTH < 24 HOURS Performed at [facility], [address], [place], [place] [address]    Report Status PENDING  Incomplete  Urine Culture     Status: None   Collection Time: 04/13/24 10:18 AM   Specimen: Urine, Clean Catch  Result Value  Ref Range Status   Specimen Description   Final    URINE, CLEAN CATCH Performed at [facility], [address], [place], [place] [address]    Special Requests   Final    NONE Performed at [facility], [address], [place], [place] [address]    Culture   Final    NO GROWTH Performed at [facility], [address], [place], [place] [address]  Report Status 04/14/2024 FINAL  Final  MRSA Next Gen by PCR, Nasal     Status: None   Collection Time: 04/13/24 11:35 AM   Specimen: Nasal Mucosa; Nasal Swab  Result Value Ref Range Status   MRSA by PCR Next Gen NOT DETECTED NOT DETECTED Final    Comment: (NOTE) The GeneXpert MRSA Assay (FDA approved for NASAL specimens only), is one component of a comprehensive MRSA colonization surveillance program. It is not intended to diagnose MRSA infection nor to guide or monitor treatment for MRSA infections. Test performance is not FDA approved in patients less than 42 years old. Performed at [facility], [address] [place], [place] [address]     Labs: Results for orders placed or performed during the hospital encounter of 04/10/24 (from the past 48 hours)  Glucose, capillary     Status: Abnormal   Collection Time: 04/13/24  7:55 AM  Result Value Ref Range   Glucose-Capillary 154 (H) 70 - 99 mg/dL    Comment: Glucose reference range applies only to samples taken after fasting for at least 8 hours.  Urinalysis, Routine w reflex microscopic -Urine, Clean Catch     Status: Abnormal   Collection Time: 04/13/24 10:18 AM  Result Value Ref Range   Color, Urine YELLOW YELLOW   APPearance CLOUDY (A) CLEAR   Specific Gravity, Urine 1.016 1.005 - 1.030   pH 6.0 5.0 - 8.0   Glucose, UA NEGATIVE NEGATIVE mg/dL   Hgb urine dipstick LARGE (A) NEGATIVE   Bilirubin Urine NEGATIVE NEGATIVE   Ketones, ur 5 (A) NEGATIVE mg/dL   Protein, ur 604 (A) NEGATIVE mg/dL   Nitrite NEGATIVE  NEGATIVE   Leukocytes,Ua LARGE (A) NEGATIVE   RBC / HPF >50 0 - 5 RBC/hpf   WBC, UA >50 0 - 5 WBC/hpf   Bacteria, UA NONE SEEN NONE SEEN   Squamous Epithelial / HPF 0-5 0 - 5 /HPF   Amorphous Crystal PRESENT     Comment: Performed at [facility], [address] [place], [place] [address]  Urine Culture     Status: None   Collection Time: 04/13/24 10:18 AM   Specimen: Urine, Clean Catch  Result Value Ref Range   Specimen Description      URINE, CLEAN CATCH Performed at [facility], [address], [place], [place] [address]    Special Requests      NONE Performed at [facility], [address] [place], [place] [address]    Culture      NO GROWTH Performed at [facility], [address], [place], [place] [address]    Report Status 04/14/2024 FINAL   Lactic acid, plasma     Status: Abnormal   Collection Time: 04/13/24 10:47 AM  Result Value Ref Range   Lactic Acid, Venous 5.1 (HH) 0.5 - 1.9 mmol/L    Comment: CRITICAL RESULT CALLED TO, READ BACK BY AND VERIFIED WITH DOMAN, J RN AT 1144 ON 04/13/2024 BY Anibal [place] Performed at [facility], [address] [place], [place] [address]   Comprehensive metabolic panel     Status: Abnormal   Collection Time: 04/13/24 10:47 AM  Result Value Ref Range   Sodium 134 (L) 135 - 145 mmol/L   Potassium 4.5 3.5 - 5.1 mmol/L   Chloride 102 98 - 111 mmol/L   CO2 19 (L) 22 - 32 mmol/L   Glucose, Bld 137 (H) 70 - 99  mg/dL    Comment: Glucose reference range applies only to samples taken after fasting for at least 8 hours.   BUN 50 (H) 8 - 23 mg/dL   Creatinine, Ser 1.61 (H) 0.61 - 1.24 mg/dL   Calcium  8.9 8.9 - 10.3 mg/dL   Total Protein 5.6 (L) 6.5 - 8.1 g/dL   Albumin  2.6 (L) 3.5 - 5.0 g/dL   AST 25 15 - 41 U/L   ALT 8 0 - 44 U/L   Alkaline Phosphatase 50 38 - 126 U/L   Total Bilirubin 1.0 0.0 - 1.2 mg/dL   GFR, Estimated 35 (L) >60 mL/min    Comment:  (NOTE) Calculated using the CKD-EPI Creatinine Equation (2021)    Anion gap 13 5 - 15    Comment: Performed at [facility], [address], [place], [place] [address]  Protime-INR     Status: Abnormal   Collection Time: 04/13/24 10:47 AM  Result Value Ref Range   Prothrombin Time 19.2 (H) 11.4 - 15.2 seconds   INR 1.6 (H) 0.8 - 1.2    Comment: (NOTE) INR goal varies based on device and disease states. Performed at [id number], [address] [place], [place] [address]   APTT     Status: Abnormal   Collection Time: 04/13/24 10:47 AM  Result Value Ref Range   aPTT 47 (H) 24 - 36 seconds    Comment:        IF BASELINE aPTT IS ELEVATED, SUGGEST PATIENT RISK ASSESSMENT BE USED TO DETERMINE APPROPRIATE ANTICOAGULANT THERAPY. Performed at [facility], [address], [place], [place] [address]   MRSA Next Gen by PCR, Nasal     Status: None   Collection Time: 04/13/24 11:35 AM   Specimen: Nasal Mucosa; Nasal Swab  Result Value Ref Range   MRSA by PCR Next Gen NOT DETECTED NOT DETECTED    Comment: (NOTE) The GeneXpert MRSA Assay (FDA approved for NASAL specimens only), is one component of a comprehensive MRSA colonization surveillance program. It is not intended to diagnose MRSA infection nor to guide or monitor treatment for MRSA infections. Test performance is not FDA approved in patients less than 77 years old. Performed at [facility], [address], [place], [place] [address]   Glucose, capillary     Status: Abnormal   Collection Time: 04/13/24 11:59 AM  Result Value Ref Range   Glucose-Capillary 107 (H) 70 - 99 mg/dL    Comment: Glucose reference range applies only to samples taken after fasting for at least 8 hours.   Comment 1 Notify RN    Comment 2 Document in Chart   Blood gas, arterial     Status: Abnormal   Collection Time: 04/13/24  1:19 PM  Result Value Ref Range   Delivery systems  NASAL CANNULA    pH, Arterial 7.44 7.35 - 7.45   pCO2 arterial 24 (L) 32 - 48 mmHg   pO2, Arterial 118 (H) 83 - 108 mmHg   Bicarbonate 16.7 (L) 20.0 - 28.0 mmol/L   Acid-base deficit 6.1 (H) 0.0 - 2.0 mmol/L   O2 Saturation 100 %   Patient temperature 37.3    Collection site LEFT BRACHIAL    Drawn by [id number]     Comment: Performed at [facility], [address] [place], [place] [address]  Lactic acid, plasma     Status: Abnormal   Collection Time: 04/13/24  2:14 PM  Result Value  Ref Range   Lactic Acid, Venous 5.4 (HH) 0.5 - 1.9 mmol/L    Comment: CRITICAL RESULT CALLED TO, READ BACK BY AND VERIFIED WITH DOMAN, J RN AT 1517 ON 04/13/2024 BY Anibal [place] Performed at [facility], [address] [place], [place] [address]   Glucose, capillary     Status: None   Collection Time: 04/13/24  3:12 PM  Result Value Ref Range   Glucose-Capillary 98 70 - 99 mg/dL    Comment: Glucose reference range applies only to samples taken after fasting for at least 8 hours.  Comprehensive metabolic panel     Status: Abnormal   Collection Time: 04/13/24  6:35 PM  Result Value Ref Range   Sodium 133 (L) 135 - 145 mmol/L   Potassium 3.8 3.5 - 5.1 mmol/L   Chloride 103 98 - 111 mmol/L   CO2 19 (L) 22 - 32 mmol/L   Glucose, Bld 129 (H) 70 - 99 mg/dL    Comment: Glucose reference range applies only to samples taken after fasting for at least 8 hours.   BUN 54 (H) 8 - 23 mg/dL   Creatinine, Ser 6.04 (H) 0.61 - 1.24 mg/dL   Calcium  8.4 (L) 8.9 - 10.3 mg/dL   Total Protein 5.4 (L) 6.5 - 8.1 g/dL   Albumin  2.7 (L) 3.5 - 5.0 g/dL   AST 29 15 - 41 U/L   ALT 9 0 - 44 U/L   Alkaline Phosphatase 41 38 - 126 U/L   Total Bilirubin 1.6 (H) 0.0 - 1.2 mg/dL   GFR, Estimated 37 (L) >60 mL/min    Comment: (NOTE) Calculated using the CKD-EPI Creatinine Equation (2021)    Anion gap 11 5 - 15    Comment: Performed at [facility], [address],  [place], [place] [address]  Troponin I (High Sensitivity)     Status: Abnormal   Collection Time: 04/13/24  6:35 PM  Result Value Ref Range   Troponin I (High Sensitivity) 38 (H) <18 ng/L    Comment: (NOTE) Elevated high sensitivity troponin I (hsTnI) values and significant  changes across serial measurements may suggest ACS but many other  chronic and acute conditions are known to elevate hsTnI results.  Refer to the "Links" section for chest pain algorithms and additional  guidance. Performed at [facility], [address] [place], [place] [address]   Brain natriuretic peptide     Status: Abnormal   Collection Time: 04/13/24  6:35 PM  Result Value Ref Range   B Natriuretic Peptide 127.6 (H) 0.0 - 100.0 pg/mL    Comment: Performed at [facility], [address] [place], [place] [address]  CBC with Differential/Platelet     Status: Abnormal   Collection Time: 04/13/24  6:35 PM  Result Value Ref Range   WBC 10.8 (H) 4.0 - 10.5 K/uL   RBC 3.04 (L) 4.22 - 5.81 MIL/uL   Hemoglobin 8.5 (L) 13.0 - 17.0 g/dL    Comment: REPEATED TO VERIFY DELTA CHECK NOTED    HCT 26.4 (L) 39.0 - 52.0 %   MCV 86.8 80.0 - 100.0 fL   MCH 28.0 26.0 - 34.0 pg   MCHC 32.2 30.0 - 36.0 g/dL   RDW 62.1 30.8 - 65.7 %   Platelets 152 150 - 400 K/uL   nRBC 0.0 0.0 - 0.2 %   Neutrophils Relative % 69 %   Neutro Abs 8.5 (H) 1.7 - 7.7 K/uL  Band Neutrophils 10 %   Lymphocytes Relative 7 %   Lymphs Abs 0.8 0.7 - 4.0 K/uL   Monocytes Relative 8 %   Monocytes Absolute 0.9 0.1 - 1.0 K/uL   Eosinophils Relative 0 %   Eosinophils Absolute 0.0 0.0 - 0.5 K/uL   Basophils Relative 0 %   Basophils Absolute 0.0 0.0 - 0.1 K/uL   WBC Morphology DOHLE BODIES     Comment: VACUOLATED NEUTROPHILS   RBC Morphology MORPHOLOGY UNREMARKABLE    Smear Review MORPHOLOGY UNREMARKABLE    Metamyelocytes Relative 6 %   Abs Immature Granulocytes 0.60 (H) 0.00 - 0.07 K/uL    Comment: Performed at  [facility], [address] [place], [place] [address]  Troponin I (High Sensitivity)     Status: Abnormal   Collection Time: 04/13/24  8:35 PM  Result Value Ref Range   Troponin I (High Sensitivity) 33 (H) <18 ng/L    Comment: (NOTE) Elevated high sensitivity troponin I (hsTnI) values and significant  changes across serial measurements may suggest ACS but many other  chronic and acute conditions are known to elevate hsTnI results.  Refer to the "Links" section for chest pain algorithms and additional  guidance. Performed at [facility], [address] [place], [place] [address]   Glucose, capillary     Status: Abnormal   Collection Time: 04/14/24 12:24 AM  Result Value Ref Range   Glucose-Capillary 142 (H) 70 - 99 mg/dL    Comment: Glucose reference range applies only to samples taken after fasting for at least 8 hours.  Basic metabolic panel with GFR     Status: Abnormal   Collection Time: 04/14/24  6:01 AM  Result Value Ref Range   Sodium 134 (L) 135 - 145 mmol/L   Potassium 3.6 3.5 - 5.1 mmol/L   Chloride 105 98 - 111 mmol/L   CO2 20 (L) 22 - 32 mmol/L   Glucose, Bld 142 (H) 70 - 99 mg/dL    Comment: Glucose reference range applies only to samples taken after fasting for at least 8 hours.   BUN 46 (H) 8 - 23 mg/dL   Creatinine, Ser 0.98 (H) 0.61 - 1.24 mg/dL   Calcium  8.2 (L) 8.9 - 10.3 mg/dL   GFR, Estimated >11 >91 mL/min    Comment: (NOTE) Calculated using the CKD-EPI Creatinine Equation (2021)    Anion gap 9 5 - 15    Comment: Performed at [facility], [address], [place], [place] [address]  Magnesium      Status: None   Collection Time: 04/14/24  6:01 AM  Result Value Ref Range   Magnesium  2.3 1.7 - 2.4 mg/dL    Comment: Performed at [facility], [address] [place], [place] [address]  Phosphorus     Status: Abnormal   Collection Time: 04/14/24  6:01 AM  Result Value Ref Range    Phosphorus 1.9 (L) 2.5 - 4.6 mg/dL    Comment: Performed at [facility], [address], [place], [place] [address]  Lactic acid, plasma     Status: Abnormal   Collection Time: 04/14/24  6:01 AM  Result Value Ref Range   Lactic Acid, Venous 2.1 (HH) 0.5 - 1.9 mmol/L    Comment: CRITICAL VALUE NOTED. VALUE IS CONSISTENT WITH PREVIOUSLY REPORTED/CALLED VALUE Performed at [facility], [name] [place], [place] [address]   Glucose, capillary     Status: Abnormal  Collection Time: 04/14/24  6:05 AM  Result Value Ref Range   Glucose-Capillary 141 (H) 70 - 99 mg/dL    Comment: Glucose reference range applies only to samples taken after fasting for at least 8 hours.  Prealbumin     Status: Abnormal   Collection Time: 04/14/24  9:41 AM  Result Value Ref Range   Prealbumin <5 (L) 18 - 38 mg/dL    Comment: Performed at [facility], [address], [place], [place] [address]  Type and screen [facility]     Status: None (Preliminary result)   Collection Time: 04/14/24  9:41 AM  Result Value Ref Range   ABO/RH(D) AB POS    Antibody Screen NEG    Sample Expiration 04/17/2024,2359    Unit Number [id number]    Blood Component Type RED CELLS,LR    Unit division 00    Status of Unit ISSUED    Transfusion Status OK TO TRANSFUSE    Crossmatch Result      Compatible Performed at [facility], [address], [place], [place] [address]   CBC     Status: Abnormal   Collection Time: 04/14/24  9:41 AM  Result Value Ref Range   WBC 7.9 4.0 - 10.5 K/uL   RBC 2.79 (L) 4.22 - 5.81 MIL/uL   Hemoglobin 7.6 (L) 13.0 - 17.0 g/dL   HCT 62.1 (L) 30.8 - 65.7 %   MCV 84.9 80.0 - 100.0 fL   MCH 27.2 26.0 - 34.0 pg   MCHC 32.1 30.0 - 36.0 g/dL   RDW 84.6 96.2 - 95.2 %   Platelets 127 (L) 150 - 400 K/uL   nRBC 0.0 0.0 - 0.2 %    Comment: Performed at [facility], [address],  [place], [place] [address]  Prepare RBC (crossmatch)     Status: None   Collection Time: 04/14/24 10:32 AM  Result Value Ref Range   Order Confirmation      ORDER PROCESSED BY BLOOD BANK Performed at [facility], [address], [place], [place] [address]   Protime-INR     Status: Abnormal   Collection Time: 04/14/24 11:41 AM  Result Value Ref Range   Prothrombin Time 19.0 (H) 11.4 - 15.2 seconds   INR 1.6 (H) 0.8 - 1.2    Comment: (NOTE) INR goal varies based on device and disease states. Performed at [facility], [address] [address] [place], [place] [address]   Glucose, capillary     Status: Abnormal   Collection Time: 04/14/24 12:10 PM  Result Value Ref Range   Glucose-Capillary 140 (H) 70 - 99 mg/dL    Comment: Glucose reference range applies only to samples taken after fasting for at least 8 hours.   Comment 1 Notify RN    Comment 2 Document in Chart   Hemoglobin and hematocrit, blood     Status: Abnormal   Collection Time: 04/14/24  4:15 PM  Result Value Ref Range   Hemoglobin 8.6 (L) 13.0 - 17.0 g/dL   HCT 66.4 (L) 40.3 - 47.4 %    Comment: Performed at [facility], [address] [place], [place] [address]  Glucose, capillary     Status: Abnormal   Collection Time: 04/14/24  4:57 PM  Result Value Ref Range   Glucose-Capillary 109 (H) 70 - 99 mg/dL    Comment: Glucose reference range applies only to samples taken after fasting for  at least 8 hours.   Comment 1 Notify RN    Comment 2 Document in Chart   Basic metabolic panel     Status: Abnormal   Collection Time: 04/14/24  6:25 PM  Result Value Ref Range   Sodium 137 135 - 145 mmol/L   Potassium 3.6 3.5 - 5.1 mmol/L   Chloride 105 98 - 111 mmol/L   CO2 20 (L) 22 - 32 mmol/L   Glucose, Bld 105 (H) 70 - 99 mg/dL    Comment: Glucose reference range applies only to samples taken after fasting for at least 8 hours.   BUN 41 (H) 8 - 23 mg/dL   Creatinine, Ser 4.09 (H)  0.61 - 1.24 mg/dL   Calcium  8.1 (L) 8.9 - 10.3 mg/dL   GFR, Estimated >81 >19 mL/min    Comment: (NOTE) Calculated using the CKD-EPI Creatinine Equation (2021)    Anion gap 12 5 - 15    Comment: Performed at [facility], [address] [place], [place] [address]  Glucose, capillary     Status: Abnormal   Collection Time: 04/14/24 11:43 PM  Result Value Ref Range   Glucose-Capillary 115 (H) 70 - 99 mg/dL    Comment: Glucose reference range applies only to samples taken after fasting for at least 8 hours.  CBC     Status: Abnormal   Collection Time: 04/15/24  4:44 AM  Result Value Ref Range   WBC 7.2 4.0 - 10.5 K/uL   RBC 2.97 (L) 4.22 - 5.81 MIL/uL   Hemoglobin 8.6 (L) 13.0 - 17.0 g/dL   HCT 95.6 (L) 21.3 - 08.6 %   MCV 88.6 80.0 - 100.0 fL   MCH 29.0 26.0 - 34.0 pg   MCHC 32.7 30.0 - 36.0 g/dL   RDW 57.8 46.9 - 62.9 %   Platelets 119 (L) 150 - 400 K/uL   nRBC 0.0 0.0 - 0.2 %    Comment: Performed at [facility], [address], [place], [place] [address]  Magnesium      Status: Abnormal   Collection Time: 04/15/24  4:44 AM  Result Value Ref Range   Magnesium  2.6 (H) 1.7 - 2.4 mg/dL    Comment: Performed at [facility], [address] [place], [place] [address]  Phosphorus     Status: Abnormal   Collection Time: 04/15/24  4:44 AM  Result Value Ref Range   Phosphorus 2.3 (L) 2.5 - 4.6 mg/dL    Comment: Performed at [facility], [address], [place], [place] [address]  Basic metabolic panel with GFR     Status: Abnormal   Collection Time: 04/15/24  4:44 AM  Result Value Ref Range   Sodium 140 135 - 145 mmol/L   Potassium 3.3 (L) 3.5 - 5.1 mmol/L   Chloride 108 98 - 111 mmol/L   CO2 23 22 - 32 mmol/L   Glucose, Bld 134 (H) 70 - 99 mg/dL    Comment: Glucose reference range applies only to samples taken after fasting for at least 8 hours.   BUN 41 (H) 8 - 23 mg/dL   Creatinine, Ser 5.36 0.61 -  1.24 mg/dL   Calcium  8.2 (L) 8.9 - 10.3 mg/dL   GFR, Estimated >64 >40 mL/min    Comment: (NOTE) Calculated using the CKD-EPI Creatinine Equation (2021)    Anion gap 9 5 - 15    Comment: Performed at [facility], [address]  [place], [place] [address]  Glucose, capillary     Status: Abnormal   Collection Time: 04/15/24  5:34 AM  Result Value Ref Range   Glucose-Capillary 126 (H) 70 - 99 mg/dL    Comment: Glucose reference range applies only to samples taken after fasting for at least 8 hours.    Imaging / Studies: ECHOCARDIOGRAM COMPLETE Result Date: 04/14/2024    ECHOCARDIOGRAM REPORT   Patient Name:   Kristopher Thompson Date of Exam: 04/14/2024 Medical Rec #:  [id number]          Height:       71.0 in Accession #:    [id number]         Weight:       240.0 lb Date of Birth:  1951/02/16          BSA:          2.278 m Patient Age:    72 years           BP:           122/56 mmHg Patient Gender: M                  HR:           110 bpm. Exam Location:  Inpatient Procedure: 2D Echo, Intracardiac Opacification Agent, Cardiac Doppler and Color            Doppler (Both Spectral and Color Flow Doppler were utilized during            procedure). Indications:    Abnormal ECG R94.31  History:        Patient has no prior history of Echocardiogram examinations.                 Abnormal ECG, Arrythmias:Tachycardia; Risk Factors:Sleep Apnea                 and Former Smoker.  Sonographer:    Kip Peon RDCS Referring Phys: [id number] DAVID MANUEL ORTIZ  Sonographer Comments: Technically challenging study due to limited acoustic windows. Image acquisition challenging due to uncooperative patient and Image acquisition challenging due to respiratory motion. IMPRESSIONS  1. Left ventricular ejection fraction, by estimation, is 65 to 70%. The left ventricle has hyperdynamic function. The left ventricle has no regional wall motion abnormalities. Left ventricular diastolic parameters are consistent  with Grade I diastolic dysfunction (impaired relaxation).  2. Right ventricular systolic function is normal. The right ventricular size is not well visualized. There is normal pulmonary artery systolic pressure. The estimated right ventricular systolic pressure is 29.4 mmHg.  3. Left atrial size was mildly dilated.  4. The mitral valve is grossly normal. No evidence of mitral valve regurgitation. No evidence of mitral stenosis.  5. The aortic valve is tricuspid. There is mild calcification of the aortic valve. There is mild thickening of the aortic valve. Aortic valve regurgitation is not visualized. Aortic valve sclerosis/calcification is present, without any evidence of aortic stenosis. FINDINGS  Left Ventricle: Left ventricular ejection fraction, by estimation, is 65 to 70%. The left ventricle has hyperdynamic function. The left ventricle has no regional wall motion abnormalities. Definity contrast agent was given IV to delineate the left ventricular endocardial borders. The left ventricular internal cavity size was normal in size. There is borderline concentric left ventricular hypertrophy. Left ventricular diastolic parameters are consistent with Grade I diastolic dysfunction (impaired relaxation). Normal left ventricular filling pressure. Right Ventricle: The right ventricular size is not well visualized. Right vetricular wall  thickness was not well visualized. Right ventricular systolic function is normal. There is normal pulmonary artery systolic pressure. The tricuspid regurgitant velocity is 2.47 m/s, and with an assumed right atrial pressure of 5 mmHg, the estimated right ventricular systolic pressure is 29.4 mmHg. Left Atrium: Left atrial size was mildly dilated. Right Atrium: Right atrial size was not well visualized. Pericardium: There is no evidence of pericardial effusion. Mitral Valve: The mitral valve is grossly normal. No evidence of mitral valve regurgitation. No evidence of mitral valve  stenosis. Tricuspid Valve: The tricuspid valve is grossly normal. Tricuspid valve regurgitation is mild. Aortic Valve: The aortic valve is tricuspid. There is mild calcification of the aortic valve. There is mild thickening of the aortic valve. Aortic valve regurgitation is not visualized. Aortic valve sclerosis/calcification is present, without any evidence of aortic stenosis. Pulmonic Valve: The pulmonic valve was not well visualized. Pulmonic valve regurgitation is not visualized. No evidence of pulmonic stenosis. Aorta: The aortic root and ascending aorta are structurally normal, with no evidence of dilitation. Venous: The inferior vena cava was not well visualized. IAS/Shunts: The interatrial septum was not well visualized.  LEFT VENTRICLE PLAX 2D LVIDd:         4.50 cm   Diastology LVIDs:         3.30 cm   LV e' medial:    7.00 cm/s LV PW:         1.30 cm   LV E/e' medial:  10.3 LV IVS:        1.20 cm   LV e' lateral:   7.00 cm/s LVOT diam:     2.00 cm   LV E/e' lateral: 10.3 LV SV:         61 LV SV Index:   27 LVOT Area:     3.14 cm  RIGHT VENTRICLE RV S prime:     15.30 cm/s TAPSE (M-mode): 1.2 cm LEFT ATRIUM             Index LA diam:        4.16 cm 1.83 cm/m LA Vol (A2C):   35.9 ml 15.76 ml/m LA Vol (A4C):   39.1 ml 17.16 ml/m LA Biplane Vol: 37.3 ml 16.37 ml/m  AORTIC VALVE LVOT Vmax:   135.00 cm/s LVOT Vmean:  90.000 cm/s LVOT VTI:    0.193 m  AORTA Ao Root diam: 3.30 cm Ao Asc diam:  3.50 cm MITRAL VALVE                TRICUSPID VALVE MV Area (PHT): 4.96 cm     TR Peak grad:   24.4 mmHg MV Decel Time: 153 msec     TR Vmax:        247.00 cm/s MV E velocity: 72.20 cm/s MV A velocity: 106.00 cm/s  SHUNTS MV E/A ratio:  0.68         Systemic VTI:  0.19 m                             Systemic Diam: 2.00 cm Karyl Paget Croitoru MD Electronically signed by Luana Rumple MD Signature Date/Time: 04/14/2024/2:55:48 PM    Final    CT ABDOMEN PELVIS W CONTRAST Result Date: 04/14/2024 CLINICAL DATA:  Abdominal pain.  Status post hernia repair. Rule out bowel perforation. EXAM: CT ABDOMEN AND PELVIS WITH CONTRAST TECHNIQUE: Multidetector CT imaging of the abdomen and pelvis was performed using the standard protocol following bolus administration  of intravenous contrast. RADIATION DOSE REDUCTION: This exam was performed according to the departmental dose-optimization program which includes automated exposure control, adjustment of the mA and/or kV according to patient size and/or use of iterative reconstruction technique. CONTRAST:  75mL OMNIPAQUE  IOHEXOL  350 MG/ML SOLN COMPARISON:  10/14/2020 FINDINGS: Lower chest: Small right pleural effusion with overlying atelectasis. There is also trace left pleural of fluid with mild subsegmental atelectasis. Hepatobiliary: No focal liver abnormality is seen. Status post cholecystectomy. No biliary dilatation. Pancreas: Diffuse pancreatic atrophy with scattered calcifications. No main duct dilatation or inflammation. Spleen: Normal in size without focal abnormality. Adrenals/Urinary Tract: Normal adrenal glands. 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 Left renal atrophy with severe hydronephrosis and hydroureter is identified which appears similar to the previous exam. New moderate right hydronephrosis and right hydroureter. Stomach/Bowel: There is a nasogastric tube with tip in the proximal stomach. The proximal small bowel loops are increased in caliber containing air-fluid levels measuring up to 4.3 cm. Gradual transition to decreased caliber mid and distal small bowel begins in the left lower quadrant, axial image 71/2 and coronal image 32/4. Small amount of a dilute contrast passes beyond this area without significant contrast material noted in the distal small bowel loops or colon. There is soft tissue stranding within the right lower quadrant of the abdomen. High attenuation material is identified scratch set there is also high density fluid noted within the  right lower quadrant of the abdomen adjacent to the small bowel anastomosis, image 40/4 and image 65/2. Small bowel anastomosis is identified within the right lower quadrant of the abdomen. Just distal to the anastomosis there is a small defect identified within the wall loop of ileum with adjacent extraluminal gas, coronal image 41/4, sagittal image 29/5 and axial image 75/2. Vascular/Lymphatic: Aortic atherosclerosis. No aneurysm. No signs of abdominopelvic adenopathy. Reproductive: Status post prostatectomy. Other: Status post repair the midline umbilical hernia noted on the previous exam. There is widening of the subcutaneous soft tissues containing gas overlying the midline hernia repair which may reflect at least partial dehiscence. There are also postsurgical changes associated with repair of the large right lower quadrant parastomal hernia with persistent marked laxity of the right lower quadrant abdominal wall. A surgical drainage catheter enters from a left abdominal approach with tip terminating in the right hemipelvis. There is also a surgical drainage catheter which enters from a right lateral approach and terminates around the right lower quadrant herniorrhaphy site. Asymmetric edema with overlying skin thickening and subcutaneous soft tissue stranding is noted extending along right flank. There is thickening of the right lateral abdominal wall musculature. This may reflect edema. Underlying intramuscular hematoma not excluded. Small volume of pneumoperitoneum is noted. Gas identified along the left lateral abdominal wall and right lower quadrant abdominal wall compatible with postoperative change. Small fluid collection along the left paramidline lower anterior abdominal wall measures 6.1 x 1.4 cm, image 82/3. Musculoskeletal: No acute or significant osseous findings. No acute or suspicious osseous findings. Unchanged superior endplate deformity at the L1 level which likely represents a Schmorl's  node. IMPRESSION: 1. Right lower quadrant small bowel loop with mural defect and adjacent extraluminal gas identified, which is concerning for underlying small bowel perforation. 2. Increase caliber of the proximal small bowel loops with gradual transition to decreased caliber mid and distal small bowel begins in the left lower quadrant. Small amount of a dilute contrast passes beyond this area without significant contrast material noted in the distal small bowel loops  or colon. Findings are concerning for small bowel ileus. Obstruction less favored but not excluded. 3. Status post repair of the midline umbilical hernia noted on the previous exam. There is widening of overlying subcutaneous soft tissues with gas. Correlate for any signs or symptoms of incisional dehiscence. 4. There are also postsurgical changes associated with repair of the large right lower quadrant parastomal hernia with persistent marked laxity of the right lower quadrant abdominal wall. 5. 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 Left renal atrophy with severe hydronephrosis and hydroureter is identified which appears similar to the previous exam. New moderate right hydronephrosis and right hydroureter. 6. Small right pleural effusion with overlying atelectasis. There is also trace left pleural of fluid with mild subsegmental atelectasis. 7. Postoperative changes involving the right lower quadrant abdominal wall with gas and edema. There is thickening of the right lateral abdominal wall musculature which may reflect underlying hematoma. 8.  Aortic Atherosclerosis (ICD10-I70.0). Critical Value/emergent results were called by telephone at the time of interpretation on 04/14/2024 at 8:10 am to provider [facility] , who verbally acknowledged these results. Electronically Signed   By: Kimberley Penman M.D.   On: 04/14/2024 08:10   CT Angio Chest Pulmonary Embolism (PE) W or WO Contrast Result Date: 04/14/2024 EXAM: CTA of the  Chest with contrast for PE 04/14/2024 04:12:39 AM TECHNIQUE: CTA of the chest was performed after the administration of intravenous contrast. Multiplanar reformatted images are provided for review. MIP images are provided for review. Automated exposure control, iterative reconstruction, and/or weight based adjustment of the mA/kV was utilized to reduce the radiation dose to as low as reasonably achievable. COMPARISON: CT chest 12/06/2023 at [facility] urology specialists. CLINICAL HISTORY: Pulmonary embolism (PE) suspected, low to intermediate prob, positive D-dimer. Post-op pain, SOB, eval for perf; Bladder cancer. FINDINGS: PULMONARY ARTERIES: Pulmonary arteries are adequately opacified for evaluation. No pulmonary embolism. Main pulmonary artery is normal in caliber. MEDIASTINUM: The heart and pericardium demonstrate no acute abnormality. There is no acute abnormality of the thoracic aorta. Atherosclerotic changes are again seen within the coronary arteries. LYMPH NODES: Previously noted right hilar nodularity is less well seen on today's study and may be improved. LUNGS AND PLEURA: No new or enlarging pulmonary nodules are present. A moderate right-sided pleural effusion is present with partial collapse of the right lower lobe. UPPER ABDOMEN: Gastric tube is in place. SOFT TISSUES AND BONES: Fused anterior osteophytes are present throughout the thoracic spine. No focal osseous lesions are present. IMPRESSION: 1. No evidence of pulmonary embolism. 2. Moderate right-sided pleural effusion with partial collapse of the right lower lobe. Electronically signed by: Audree Leas MD 04/14/2024 04:26 AM EDT RP Workstation: [id number]   DG Abd Portable 1V Result Date: 04/13/2024 CLINICAL DATA:  NG tube placement EXAM: PORTABLE ABDOMEN - 1 VIEW COMPARISON:  None Available. FINDINGS: the tip of the nasogastric tube is located within the body fundus stomach in good position. IMPRESSION: Enteric tube in the stomach.  Electronically Signed   By: Fredrich Jefferson M.D.   On: 04/13/2024 08:15   US  EKG SITE RITE Result Date: 04/13/2024 If Site Rite image not attached, placement could not be confirmed due to current cardiac rhythm.   Medications / Allergies: per chart  Antibiotics: Anti-infectives (From admission, onward)    Start     Dose/Rate Route Frequency Ordered Stop   04/14/24 1800  ceFEPIme  (MAXIPIME ) 2 g in sodium chloride  0.9 % 100 mL IVPB  2 g 200 mL/hr over 30 Minutes Intravenous Every 8 hours 04/14/24 1003     04/14/24 1600  vancomycin  (VANCOCIN ) IVPB 1000 mg/200 mL premix  Status:  Discontinued        1,000 mg 200 mL/hr over 60 Minutes Intravenous Every 24 hours 04/13/24 1420 04/14/24 1000   04/14/24 1600  vancomycin  (VANCOREADY) IVPB 1500 mg/300 mL        1,500 mg 150 mL/hr over 120 Minutes Intravenous Every 24 hours 04/14/24 1002     04/14/24 1200  vancomycin  (VANCOCIN ) IVPB 1000 mg/200 mL premix  Status:  Discontinued        1,000 mg 200 mL/hr over 60 Minutes Intravenous Every 24 hours 04/13/24 1053 04/13/24 1109   04/13/24 2200  ceFEPIme  (MAXIPIME ) 2 g in sodium chloride  0.9 % 100 mL IVPB  Status:  Discontinued        2 g 200 mL/hr over 30 Minutes Intravenous Every 12 hours 04/13/24 1036 04/13/24 1109   04/13/24 2200  ceFEPIme  (MAXIPIME ) 2 g in sodium chloride  0.9 % 100 mL IVPB  Status:  Discontinued        2 g 200 mL/hr over 30 Minutes Intravenous Every 12 hours 04/13/24 1425 04/14/24 1003   04/13/24 1500  vancomycin  (VANCOCIN ) 2,000 mg in sodium chloride  0.9 % 500 mL IVPB        2,000 mg 260 mL/hr over 120 Minutes Intravenous  Once 04/13/24 1409 04/13/24 1850   04/13/24 1500  ceFEPIme  (MAXIPIME ) 2 g in sodium chloride  0.9 % 100 mL IVPB  Status:  Discontinued        2 g 200 mL/hr over 30 Minutes Intravenous Every 12 hours 04/13/24 1420 04/13/24 1425   04/13/24 1500  metroNIDAZOLE  (FLAGYL ) IVPB 500 mg        500 mg 100 mL/hr over 60 Minutes Intravenous Every 12 hours 04/13/24  1420     04/13/24 1200  vancomycin  (VANCOCIN ) 2,000 mg in sodium chloride  0.9 % 500 mL IVPB  Status:  Discontinued        2,000 mg 260 mL/hr over 120 Minutes Intravenous  Once 04/13/24 1052 04/13/24 1121   04/13/24 1100  metroNIDAZOLE  (FLAGYL ) IVPB 500 mg  Status:  Discontinued        500 mg 100 mL/hr over 60 Minutes Intravenous 2 times daily 04/13/24 1007 04/13/24 1109   04/13/24 1100  vancomycin  (VANCOCIN ) IVPB 1000 mg/200 mL premix  Status:  Discontinued        1,000 mg 200 mL/hr over 60 Minutes Intravenous  Once 04/13/24 1007 04/13/24 1020   04/13/24 1015  ceFEPIme  (MAXIPIME ) 2 g in sodium chloride  0.9 % 100 mL IVPB        2 g 200 mL/hr over 30 Minutes Intravenous STAT 04/13/24 1007 04/14/24 1721   04/10/24 2200  ceFAZolin  (ANCEF ) IVPB 2g/100 mL premix        2 g 200 mL/hr over 30 Minutes Intravenous Every 8 hours 04/10/24 1833 04/11/24 0531   04/10/24 0600  ceFAZolin  (ANCEF ) IVPB 2g/100 mL premix        2 g 200 mL/hr over 30 Minutes Intravenous On call to O.R. 04/10/24 0533 04/10/24 1526         Note: Portions of this report may have been transcribed using voice recognition software. Every effort was made to ensure accuracy; however, inadvertent computerized transcription errors may be present.   Any transcriptional errors that result from this process are unintentional.    Eddye Goodie, MD, FACS, MASCRS  Esophageal, Gastrointestinal & Colorectal Surgery Robotic and Minimally Invasive Surgery  [facility] [address] [place], [place] [address] [phone] Fax [phone] Main  CONTACT INFORMATION: Weekday (9AM-5PM): Call [facility] main office at [phone] Weeknight (5PM-9AM) or Weekend/Holiday: Check EPIC "Web Links" tab & use "AMION" (password " [email or web address]") for General Surgery [facility] coverage  Please, DO NOT use SecureChat  (it is not reliable communication to reach operating surgeons & will lead to a  delay in care).   Epic staff messaging available for outptient concerns needing 1-2 business day response.      04/15/2024  7:43 AM

## 2024-04-16 ENCOUNTER — Inpatient Hospital Stay (HOSPITAL_COMMUNITY)

## 2024-04-16 DIAGNOSIS — I5030 Unspecified diastolic (congestive) heart failure: Secondary | ICD-10-CM | POA: Diagnosis not present

## 2024-04-16 DIAGNOSIS — J9601 Acute respiratory failure with hypoxia: Secondary | ICD-10-CM | POA: Diagnosis not present

## 2024-04-16 DIAGNOSIS — G9340 Encephalopathy, unspecified: Secondary | ICD-10-CM | POA: Diagnosis not present

## 2024-04-16 DIAGNOSIS — E876 Hypokalemia: Secondary | ICD-10-CM

## 2024-04-16 DIAGNOSIS — K430 Incisional hernia with obstruction, without gangrene: Secondary | ICD-10-CM | POA: Diagnosis not present

## 2024-04-16 DIAGNOSIS — D62 Acute posthemorrhagic anemia: Secondary | ICD-10-CM

## 2024-04-16 LAB — COMPREHENSIVE METABOLIC PANEL WITH GFR
ALT: 11 U/L (ref 0–44)
AST: 22 U/L (ref 15–41)
Albumin: 2.1 g/dL — ABNORMAL LOW (ref 3.5–5.0)
Alkaline Phosphatase: 51 U/L (ref 38–126)
Anion gap: 6 (ref 5–15)
BUN: 57 mg/dL — ABNORMAL HIGH (ref 8–23)
CO2: 25 mmol/L (ref 22–32)
Calcium: 8.6 mg/dL — ABNORMAL LOW (ref 8.9–10.3)
Chloride: 112 mmol/L — ABNORMAL HIGH (ref 98–111)
Creatinine, Ser: 1.22 mg/dL (ref 0.61–1.24)
GFR, Estimated: >60 mL/min (ref >60)
Glucose, Bld: 167 mg/dL — ABNORMAL HIGH (ref 70–99)
Potassium: 3.1 mmol/L — ABNORMAL LOW (ref 3.5–5.1)
Sodium: 143 mmol/L (ref 135–145)
Total Bilirubin: 1.3 mg/dL — ABNORMAL HIGH (ref 0.0–1.2)
Total Protein: 5.4 g/dL — ABNORMAL LOW (ref 6.5–8.1)

## 2024-04-16 LAB — CBC
HCT: 26.9 % — ABNORMAL LOW (ref 39.0–52.0)
Hemoglobin: 8.8 g/dL — ABNORMAL LOW (ref 13.0–17.0)
MCH: 28.9 pg (ref 26.0–34.0)
MCHC: 32.7 g/dL (ref 30.0–36.0)
MCV: 88.5 fL (ref 80.0–100.0)
Platelets: 152 10*3/uL (ref 150–400)
RBC: 3.04 MIL/uL — ABNORMAL LOW (ref 4.22–5.81)
RDW: 14.9 % (ref 11.5–15.5)
WBC: 10.1 10*3/uL (ref 4.0–10.5)
nRBC: 0 % (ref 0.0–0.2)

## 2024-04-16 LAB — GLUCOSE, CAPILLARY
Glucose-Capillary: 144 mg/dL — ABNORMAL HIGH (ref 70–99)
Glucose-Capillary: 149 mg/dL — ABNORMAL HIGH (ref 70–99)
Glucose-Capillary: 158 mg/dL — ABNORMAL HIGH (ref 70–99)
Glucose-Capillary: 159 mg/dL — ABNORMAL HIGH (ref 70–99)
Glucose-Capillary: 161 mg/dL — ABNORMAL HIGH (ref 70–99)

## 2024-04-16 LAB — MAGNESIUM: Magnesium: 2.2 mg/dL (ref 1.7–2.4)

## 2024-04-16 LAB — PHOSPHORUS: Phosphorus: 2.9 mg/dL (ref 2.5–4.6)

## 2024-04-16 MED ORDER — FUROSEMIDE 10 MG/ML IJ SOLN
40.0000 mg | Freq: Once | INTRAMUSCULAR | Status: AC
  Administered 2024-04-16: 40 mg via INTRAVENOUS
  Filled 2024-04-16: qty 4

## 2024-04-16 MED ORDER — POTASSIUM CHLORIDE 10 MEQ/100ML IV SOLN
10.0000 meq | INTRAVENOUS | Status: AC
  Administered 2024-04-16 (×6): 10 meq via INTRAVENOUS
  Filled 2024-04-16 (×5): qty 100

## 2024-04-16 MED ORDER — TRAVASOL 10 % IV SOLN
INTRAVENOUS | Status: AC
  Filled 2024-04-16: qty 1015.2

## 2024-04-16 MED ORDER — METOPROLOL TARTRATE 5 MG/5ML IV SOLN
2.5000 mg | Freq: Three times a day (TID) | INTRAVENOUS | Status: DC
  Administered 2024-04-16 – 2024-04-19 (×11): 2.5 mg via INTRAVENOUS
  Filled 2024-04-16 (×11): qty 5

## 2024-04-16 MED ORDER — POTASSIUM CHLORIDE 10 MEQ/100ML IV SOLN
10.0000 meq | INTRAVENOUS | Status: DC
  Administered 2024-04-16: 10 meq via INTRAVENOUS
  Filled 2024-04-16 (×2): qty 100

## 2024-04-16 MED ORDER — METOCLOPRAMIDE HCL 5 MG/ML IJ SOLN
10.0000 mg | Freq: Four times a day (QID) | INTRAMUSCULAR | Status: AC
  Administered 2024-04-16 (×3): 10 mg via INTRAVENOUS
  Filled 2024-04-16 (×3): qty 2

## 2024-04-16 NOTE — Progress Notes (Addendum)
 NAME:  Kristopher Thompson, MRN:  [id number], DOB:  [birth year], LOS: 6 ADMISSION DATE:  04/10/2024, CONSULTATION DATE:  04/13/2024 REFERRING MD:  Dr Bonita Bussing, CHIEF COMPLAINT: Sepsis  History of Present Illness:  S/p repair of incisional/parastomal and ventral hernias, left inguinal incarcerated , abdominal hernias, lysis of adhesions, urostomy revision Day 1 postoperatively  Transferred to the ICU secondary to tachycardia, tachypnea  History of bladder cancer, prostate cancer, diastolic heart failure, diabetes, right bundle branch block, multiple abdominal surgeries  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   moderate atypia - right low back   Atypical nevus 04/04/2007   moderate to marked - right upper back (wider shave)   Atypical nevus 04/04/2007   moderate atypia - center chest (wider shave)   Atypical nevus 04/04/2007   slight atypia - right thigh   Atypical nevus 11/29/2011   mild atypia - center upper back   Atypical nevus 11/29/2011   mild atypia - center chest   Bladder cancer (HCC) dx 07/2017   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 denies   Sleep apnea    cpap   Squamous cell carcinoma of skin 05/22/2013   left cheek - CX3 + 5FU   Wears glasses    Significant Hospital Events: Including procedures, antibiotic start  and stop dates in addition to other pertinent events   04/10/2024-laparoscopic surgery 04/13/24 CCM consult. Incr NE. Art line  6/3 weaning NE. Gently diuresed w 3L off  6/4 off NE  Interim History / Subjective:  NG tube was replaced on 6/4, 3 L out his smoking history.  No further emesis Good UOP with lasix  on 6/4 > I/O +716cc total  Objective    Blood pressure (!) 143/54, pulse (!) 104, temperature 98.5 F (36.9 C), temperature source Oral, resp. rate (!) 26, height 5\' 11"  (1.803 m), weight 98.4 kg, SpO2 95%.        Intake/Output Summary (Last 24 hours) at 04/16/2024 0741 Last data filed at 04/16/2024 4098 Gross per 24 hour  Intake 3113.82 ml  Output 7320 ml  Net -4206.18 ml   Filed Weights   04/10/24 0536 04/16/24 0500  Weight: 108.9 kg 98.4 kg    Examination: General: Chronically ill-appearing obese man laying in bed in no distress HENT: Oropharynx clear, speech is slightly garbled but no secretions, no stridor Lungs: Decreased at both bases, clear otherwise Cardiovascular: Regular, distant, no murmur Abdomen: Obese, nondistended, binder in place, drains with serosanguineous Extremities: No deformities, trace pretibial edema Neuro: Wakes easily, interacts, better oriented, still slightly dysarthric but easier to understand.  Follows commands GU: Urostomy in place with yellow urine  Resolved problem list   Assessment and Plan    Incarcerated abd wall hernia  s/p LOA hernia repairs urostomy ileal conduit revision Post op ileus, improving   Shock -- possible intraabd infection, improved shock  -CT a/p 6/3 c/f possible small bowel perf + possible small bowel ileus + possible surgical incision dehiscence + a small fluid collection lower anterior abd wall (L paramidline). CCS and rad have discussed these findings. Clinically pt continuing to improve w abx/supportive care arguing against a perf.  P -plan to complete 5 days total abx cefepime /flagyl . Will stop vanco 6/4 -can  probably d/c art line 6/4 -NGT to suction, NPO. Remains on TPN -push PT/OT/OOB  Acute hypoxic resp failure  R pleural effusion  OSA on CPAP P -Tolerated diuresis 6/4, dosing daily.  Plan to repeat x 1 today/5.  Follow BMP, urine output and electrolytes - Push pulmonary hygiene, mobility, I-S - Repeat chest x-ray next few days  Encephalopathy, likely metabolic P - Improving, continue to correct metabolic disarray - Minimize sedating medications - Head CT ordered and pending  Diastolic HF -Not on a maintenance diuretic regimen as an outpatient, dosing daily currently given his total body volume overload from the hospitalization  HypoK HypoPhos HyperMag  P -Replete potassium 6/5 -Potassium in TPN as per pharmacy -Follow BMP  ABLA superimposed on chronic anemia R flank hematoma  - 2 PRBC 6/3 for hgb 8.5 6/2 night + visible hematoma  P -Following CBC -Back on enoxaparin  for DVT prophylaxis.  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, holding in the acute setting since    Best Practice (right click and "Reselect all SmartList Selections" daily)   Diet/type: NPO  DVT prophylaxis SCD lovenox   Pressure ulcer(s): N/A GI prophylaxis: N/A Lines: Central line,PICC art line  Foley:  Yes, and it is still needed Code Status:  full code Last date of multidisciplinary goals of care discussion [d/w family at bedside 6/5  Labs   CBC: Recent Labs  Lab 04/13/24 0421 04/13/24 1835 04/14/24 0941 04/14/24 1615 04/15/24 0444 04/16/24 0433  WBC 13.3* 10.8* 7.9  --  7.2 10.1  NEUTROABS  --  8.5*  --   --   --   --   HGB 12.1* 8.5* 7.6* 8.6* 8.6* 8.8*  HCT 38.2* 26.4* 23.7* 26.1* 26.3* 26.9*  MCV 90.3 86.8 84.9  --  88.6 88.5  PLT 166 152 127*  --  119* 152    Basic Metabolic Panel: Recent Labs  Lab 04/12/24 0437 04/13/24 0421 04/13/24 1835 04/14/24 0601 04/14/24 1825  04/15/24 0444 04/16/24 0433  NA 133*   < > 133* 134* 137 140 143  K 5.1   < > 3.8 3.6 3.6 3.3* 3.1*  CL 103   < > 103 105 105 108 112*  CO2 22   < > 19* 20* 20* 23 25  GLUCOSE 163*   < > 129* 142* 105* 134* 167*  BUN 40*   < > 54* 46* 41* 41* 57*  CREATININE 2.08*   < > 1.91* 1.27* 1.25* 1.19 1.22  CALCIUM  8.5*   < > 8.4* 8.2* 8.1* 8.2* 8.6*  MG 2.1  --   --  2.3  --  2.6* 2.2  PHOS 2.7  --   --  1.9*  --  2.3* 2.9   < > = values in this interval not displayed.   GFR: Estimated Creatinine Clearance: 66.4 mL/min (by C-G formula based on SCr of 1.22 mg/dL). Recent  Labs  Lab 04/13/24 0713 04/13/24 1047 04/13/24 1414 04/13/24 1835 04/14/24 0601 04/14/24 0941 04/15/24 0444 04/16/24 0433  WBC  --   --   --  10.8*  --  7.9 7.2 10.1  LATICACIDVEN 3.8* 5.1* 5.4*  --  2.1*  --   --   --     Liver Function Tests: Recent Labs  Lab 04/13/24 1047 04/13/24 1835 04/16/24 0433  AST 25 29 22   ALT 8 9 11   ALKPHOS 50 41 51  BILITOT 1.0 1.6* 1.3*  PROT 5.6* 5.4* 5.4*  ALBUMIN  2.6* 2.7* 2.1*   No results for input(s): "LIPASE", "AMYLASE" in the last 168 hours. No results for input(s): "AMMONIA" in the last 168 hours.  ABG    Component Value Date/Time   PHART 7.44 04/13/2024 1319   PCO2ART 24 (L) 04/13/2024 1319   PO2ART 118 (H) 04/13/2024 1319   HCO3 16.7 (L) 04/13/2024 1319   TCO2 24 08/08/2017 1734   ACIDBASEDEF 6.1 (H) 04/13/2024 1319   O2SAT 100 04/13/2024 1319     Coagulation Profile: Recent Labs  Lab 04/13/24 1047 04/14/24 1141  INR 1.6* 1.6*    Cardiac Enzymes: No results for input(s): "CKTOTAL", "CKMB", "CKMBINDEX", "TROPONINI" in the last 168 hours.  HbA1C: Hgb A1c MFr Bld  Date/Time Value Ref Range Status  04/01/2024 10:50 AM 5.3 4.8 - 5.6 % Final    Comment:    (NOTE) Pre diabetes:          5.7%-6.4%  Diabetes:              >6.4%  Glycemic control for   <7.0% adults with diabetes     CBG: Recent Labs  Lab 04/14/24 2343 04/15/24 0534  04/15/24 1232 04/16/24 0028 04/16/24 0609  GLUCAP 115* 126* 137* 149* 159*    Racheal Buddle, MD, PhD 04/16/2024, 7:41 AM [facility] [phone] or if no answer before 7:00PM call [phone] For any issues after 7:00PM please call eLink [phone]

## 2024-04-16 NOTE — Progress Notes (Signed)
 04/16/2024  Kristopher Thompson [id number] 10-06-1951  CARE TEAM: PCP: Dudley Ghee, MD  Outpatient Care Team: Patient Care Team: Dudley Ghee, MD as PCP - General (Internal Medicine) Devon Fogo, MD (Inactive) as Consulting Physician (Dermatology) Lavina Pou, MD as Referring Physician (Pulmonary Disease) Candyce Champagne, MD as Consulting Physician (General Surgery) Florencio Hunting, MD as Consulting Physician (Urology)  Inpatient Treatment Team: Treatment Team:  Candyce Champagne, MD Pccm, Md, MD Etter Hermann., MD Carlyle Childes, RN Digna Fraise, NT Iona Manis, RN [place], Elijah Gu[place], RN [place], [place], RN [place], [place], [place] May, Barbara J, RN Roselee Cong, [facility]   Problem List:   Principal Problem:   Incarcerated incisional hernia Active Problems:   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 08/08/2017   AKI (acute kidney injury) [facility])   Sinus tachycardia   Tachypnea   Acute respiratory insufficiency, postoperative   Sepsis due to undetermined organism (HCC)   Lactic acidosis   Class 2 obesity   04/10/2024  POST-OPERATIVE DIAGNOSIS:  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  Largest hernia was in the right lower quadrant around the ileal conduit urostomy.  16 x 12 cm region.  Incarcerated with over a foot of small bowel.  Midline next largest 9 x 8 cm incarcerated with numerous loops of small  bowel.  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  Another direct space hernia in the left lower quadrant and indirect hernia on the left lower quadrant.  No obvious hernias on the right side.   Component separation's done on both sides to try and get the largest midline and parastomal hernias close down with running serrated try to fix absorbable suture.  Broad underlay repair with 30 x 35 cm mesh transverse  Assessment [facility] Stay = 6 days) 6 Days Post-Op    Stable but with persistent ileus   Plan:  NG tube for persistent ileus  TPN to avoid malnutrition.  Diuresis as tolerated.  Has actually pretty negative with over 3 L out of NG tube and diuresis.  Creatinine stable.  Follow.  Reasonable to continue antibiotics for 5 days total until all cultures come back to make sure were not missing anything.  So far blood and urine cultures negative.  CAT scan raise concerns of bubble near bowel and possible bowel perforation but no fluid inflammation or major gas in the region.  Patient markedly improved and no longer in shock or pressors argues against that.  Drains in abdominal wall and peritoneum are serosanguineous and do not look like succus.  That is reassuring.  Follow closely.  If he markedly worsens then repeat CAT scan to rule out abscess and/or diagnostic laparoscopy/abdominal exploration.  Hopefully less likely now but we will see.  CT of chest negative for any obvious pulmonary embolism or pneumonia.  Oxygen needs less.  Echocardiogram shows grade 1 diastolic dysfunction which I believe is his baseline.  Defer to critical care/internal medicine they feel further is needed aside for some monitor diuresis.  ABLA on top of anemia of chronic disease improved with transfusion.  Follow.    Urostomy functioning with no evidence of any leak or other concern.  Perhaps will send abdominal wall drain for creatinine before pulling.  Keep in place for now.  -monitor  electrolytes & replace as needed.  Keep K>4, Mg>2, Phos>3.  Hypokalemia not surprising given diuresis and high NG tube output.  Replete IV.  -Diabetes.  Sliding scale insulin .  Some mild hyperglycemia but nothing too severe.  Keep slightly elevated/on the sweet side to avoid severe hypoglycemic events.  -VTE prophylaxis- SCDs.  Anticoagulation prophyllaxis SQ as appropriate  -mobilize as tolerated to help recovery.  Enlist therapies in moderate/high risk patients as appropriate.  He seems rather tired and deconditioned and is at least a two-person assist.  Hopefully we can get him up to the chair more to help him.    I updated the patient's status to the the patient, his wife at bedside, nurse in room.  Recommendations were made.  Questions were answered.  They expressed understanding & appreciation.  -Disposition: He is going to be here a while       I reviewed nursing notes, last 24 h vitals and pain scores, last 48 h intake and output, last 24 h labs and trends, and last 24 h imaging results.  I have reviewed this patient's available data, including medical history, events of note, test results, etc as part of my evaluation.   A significant portion of that time was spent in counseling. Care during the described time interval was provided by me.  This care required high  level of medical decision making.  04/16/2024    Subjective: (Chief complaint)  Patient had repeat emesis and relented and allowing NG tube with large return.  Less abdominal pain.  Thirsty.  Wants sips and ice chips  CT of head not done yet.  Getting done now.  Objective:  Vital signs:  Vitals:   04/16/24 0300 04/16/24 0400 04/16/24 0500 04/16/24 0600  BP: 128/65 (!) 132/53 111/63 (!) 143/54  Pulse: 99 (!) 101 92 (!) 104  Resp: (!) 28 19 (!) 24 (!) 26  Temp:   98.5 F (36.9 C)   TempSrc:   Oral   SpO2: 98% 96% 98% 95%  Weight:   98.4 kg   Height:        Last BM Date : 04/15/24  Intake/Output    Yesterday:  06/04 0701 - 06/05 0700 In: 3113.8 [P.O.:90; I.V.:1856.4; NG/GT:30; IV Piggyback:1137.4] Out: 7320 [Urine:4175; Emesis/NG output:3000; Drains:145] This shift:  No intake/output data recorded.  Bowel function:  Flatus: YES  BM:  YES -small  Drains:  RLQ in hernia sac subcutaneous tissues of abdominal wall: serosanguineous LUQ intraperitoneal / pelvis:  serosanguineous   Physical Exam:  General: Pt awake/alert in no acute distress.  More alert and interactive.  Not tired nor sickly.   Eyes: PERRL, normal EOM.  Sclera clear.  No icterus Neuro: CN II-XII intact w/o focal sensory/motor deficits. Lymph: No head/neck/groin lymphadenopathy Psych:  No delerium/psychosis/paranoia.  Oriented x 4.  No confusion. HENT: Normocephalic, Mucus membranes moist.  No thrush.  Mumbling a little bit when he talks. Neck: Supple, No tracheal deviation.  No obvious thyromegaly Chest: No pain to chest wall compression.  Good respiratory excursion.  No audible wheezing.  Lungs clear to auscultation without wheezing, rales, nor rhonchi CV:  Pulses intact.  Regular rhythm.  No major extremity edema MS: Normal AROM mjr joints.  No obvious deformity  Abdomen: Obese but Soft.  Nondistended.  Tenderness at right flank only with significant ecchymosis and edema especially posteriorly - stable .  No crepitus or cellulitis.  Incisions clean dry intact.  No purulent drainage..  No evidence of guarding nor peritonitis.  No incarcerated hernias.   Right lower quadrant ileal conduit urostomy viable with clear lightly yellow-colored urine in bag.   Ext:   No deformity.  No mjr edema.  No cyanosis Skin: No petechiae / purpurea.  No major sores.  Warm and dry    Results:   Cultures: Recent Results (from the past 720 hours)  Culture, blood (Routine X 2) w Reflex to ID Panel     Status: None (Preliminary result)   Collection Time: 04/13/24  7:13 AM   Specimen: BLOOD  Result Value Ref Range Status    Specimen Description   Final    BLOOD BLOOD LEFT ARM AEROBIC BOTTLE ONLY ANAEROBIC BOTTLE ONLY Performed at [facility], [address], [place], [place] [address]    Special Requests   Final    BOTTLES DRAWN AEROBIC AND ANAEROBIC Blood Culture results may not be optimal due to an inadequate volume of blood received in culture bottles Performed at [facility], [address], [place], [place] [address]    Culture   Final    NO GROWTH 2 DAYS Performed at [facility], [address], [place], [place] [address]    Report Status PENDING  Incomplete  Culture, blood (Routine X 2) w Reflex to ID Panel     Status: None (Preliminary result)   Collection Time: 04/13/24  7:20 AM   Specimen: BLOOD  Result Value Ref Range Status   Specimen Description   Final    BLOOD BLOOD RIGHT ARM AEROBIC BOTTLE ONLY ANAEROBIC BOTTLE ONLY Performed at [facility], [address] [place], [place] [address]    Special Requests   Final    BOTTLES DRAWN AEROBIC AND ANAEROBIC Blood Culture results may not be optimal due to an inadequate volume of blood received in culture bottles Performed at [facility], [address], [place], [place] [address]    Culture   Final    NO GROWTH 2 DAYS Performed at [facility], [address] [place], [place] [address]    Report Status PENDING  Incomplete  Urine Culture     Status: None   Collection Time: 04/13/24 10:18 AM   Specimen: Urine, Clean Catch  Result Value Ref Range Status   Specimen Description   Final    URINE, CLEAN CATCH Performed at [facility], [address] [place], [place] [address]    Special Requests   Final    NONE Performed at [facility], [address], [place], [place] [address]    Culture   Final    NO GROWTH Performed at [facility], [address] [place], [place] [address]    Report Status  04/14/2024 FINAL  Final  MRSA Next Gen by PCR, Nasal     Status: None   Collection Time: 04/13/24 11:35 AM   Specimen: Nasal Mucosa; Nasal Swab  Result Value Ref Range Status   MRSA by PCR Next Gen NOT DETECTED NOT DETECTED Final    Comment: (NOTE) The GeneXpert MRSA Assay (FDA approved for NASAL specimens only), is one component of a comprehensive MRSA colonization surveillance program. It is not  intended to diagnose MRSA infection nor to guide or monitor treatment for MRSA infections. Test performance is not FDA approved in patients less than 44 years old. Performed at [facility], [address] [place], [place] [address]     Labs: Results for orders placed or performed during the hospital encounter of 04/10/24 (from the past 48 hours)  Prealbumin     Status: Abnormal   Collection Time: 04/14/24  9:41 AM  Result Value Ref Range   Prealbumin <5 (L) 18 - 38 mg/dL    Comment: Performed at [facility], [address], [place], [place] [address]  Type and screen [facility]     Status: None   Collection Time: 04/14/24  9:41 AM  Result Value Ref Range   ABO/RH(D) AB POS    Antibody Screen NEG    Sample Expiration 04/17/2024,2359    Unit Number [id number]    Blood Component Type RED CELLS,LR    Unit division 00    Status of Unit ISSUED,FINAL    Transfusion Status OK TO TRANSFUSE    Crossmatch Result      Compatible Performed at [facility], [address], [place], [place] [address]   CBC     Status: Abnormal   Collection Time: 04/14/24  9:41 AM  Result Value Ref Range   WBC 7.9 4.0 - 10.5 K/uL   RBC 2.79 (L) 4.22 - 5.81 MIL/uL   Hemoglobin 7.6 (L) 13.0 - 17.0 g/dL   HCT 29.5 (L) 18.8 - 41.6 %   MCV 84.9 80.0 - 100.0 fL   MCH 27.2 26.0 - 34.0 pg   MCHC 32.1 30.0 - 36.0 g/dL   RDW 60.6 30.1 - 60.1 %   Platelets 127 (L) 150 - 400 K/uL   nRBC 0.0 0.0 - 0.2 %    Comment: Performed at [facility], [address] [place], [place] [address]  Prepare RBC (crossmatch)     Status: None   Collection Time: 04/14/24 10:32 AM  Result Value Ref Range   Order Confirmation      ORDER PROCESSED BY BLOOD BANK Performed at [facility], [address] [place], [place] [address]   Protime-INR     Status: Abnormal   Collection Time: 04/14/24 11:41 AM  Result Value Ref Range   Prothrombin Time 19.0 (H) 11.4 - 15.2 seconds   INR 1.6 (H) 0.8 - 1.2    Comment: (NOTE) INR goal varies based on device and disease states. Performed at [facility], [address] [place], [place] [address]   Glucose, capillary     Status: Abnormal   Collection Time: 04/14/24 12:10 PM  Result Value Ref Range   Glucose-Capillary 140 (H) 70 - 99 mg/dL    Comment: Glucose reference range applies only to samples taken after fasting for at least 8 hours.   Comment 1 Notify RN    Comment 2 Document in Chart   Hemoglobin and hematocrit, blood     Status: Abnormal   Collection Time: 04/14/24  4:15 PM  Result Value Ref Range   Hemoglobin 8.6 (L) 13.0 - 17.0 g/dL   HCT 27.0 (L) 62.3 - 76.2 %    Comment: Performed at [facility], [address] [place], [place] [address]  Glucose, capillary     Status: Abnormal   Collection Time: 04/14/24  4:57 PM  Result Value Ref Range   Glucose-Capillary  109 (H) 70 - 99 mg/dL    Comment: Glucose reference range applies only to samples taken after fasting for at least 8 hours.   Comment 1 Notify RN    Comment 2 Document in Chart   Basic metabolic panel     Status: Abnormal   Collection Time: 04/14/24  6:25 PM  Result Value Ref Range   Sodium 137 135 - 145 mmol/L   Potassium 3.6 3.5 - 5.1 mmol/L   Chloride 105 98 - 111 mmol/L   CO2 20 (L) 22 - 32 mmol/L   Glucose, Bld 105 (H) 70 - 99 mg/dL    Comment: Glucose reference range applies only to samples taken after fasting for at least 8 hours.   BUN 41  (H) 8 - 23 mg/dL   Creatinine, Ser 4.09 (H) 0.61 - 1.24 mg/dL   Calcium  8.1 (L) 8.9 - 10.3 mg/dL   GFR, Estimated >81 >19 mL/min    Comment: (NOTE) Calculated using the CKD-EPI Creatinine Equation (2021)    Anion gap 12 5 - 15    Comment: Performed at [facility], [address] [place], [place] [address]  Glucose, capillary     Status: Abnormal   Collection Time: 04/14/24 11:43 PM  Result Value Ref Range   Glucose-Capillary 115 (H) 70 - 99 mg/dL    Comment: Glucose reference range applies only to samples taken after fasting for at least 8 hours.  CBC     Status: Abnormal   Collection Time: 04/15/24  4:44 AM  Result Value Ref Range   WBC 7.2 4.0 - 10.5 K/uL   RBC 2.97 (L) 4.22 - 5.81 MIL/uL   Hemoglobin 8.6 (L) 13.0 - 17.0 g/dL   HCT 95.6 (L) 21.3 - 08.6 %   MCV 88.6 80.0 - 100.0 fL   MCH 29.0 26.0 - 34.0 pg   MCHC 32.7 30.0 - 36.0 g/dL   RDW 57.8 46.9 - 62.9 %   Platelets 119 (L) 150 - 400 K/uL   nRBC 0.0 0.0 - 0.2 %    Comment: Performed at [facility], [address] [place], [place] [address]  Magnesium      Status: Abnormal   Collection Time: 04/15/24  4:44 AM  Result Value Ref Range   Magnesium  2.6 (H) 1.7 - 2.4 mg/dL    Comment: Performed at [facility], [address] [place], [place] [address]  Phosphorus     Status: Abnormal   Collection Time: 04/15/24  4:44 AM  Result Value Ref Range   Phosphorus 2.3 (L) 2.5 - 4.6 mg/dL    Comment: Performed at [facility], [address] [place], [place] [address]  Basic metabolic panel with GFR     Status: Abnormal   Collection Time: 04/15/24  4:44 AM  Result Value Ref Range   Sodium 140 135 - 145 mmol/L   Potassium 3.3 (L) 3.5 - 5.1 mmol/L   Chloride 108 98 - 111 mmol/L   CO2 23 22 - 32 mmol/L   Glucose, Bld 134 (H) 70 - 99 mg/dL    Comment: Glucose reference range applies only to samples taken after fasting for at least 8 hours.   BUN 41  (H) 8 - 23 mg/dL   Creatinine, Ser 5.36 0.61 - 1.24 mg/dL   Calcium  8.2 (L) 8.9 - 10.3 mg/dL   GFR, Estimated >64 >40 mL/min    Comment: (NOTE) Calculated using the CKD-EPI Creatinine Equation (2021)  Anion gap 9 5 - 15    Comment: Performed at [facility], [address], [place], [place] [address]  Glucose, capillary     Status: Abnormal   Collection Time: 04/15/24  5:34 AM  Result Value Ref Range   Glucose-Capillary 126 (H) 70 - 99 mg/dL    Comment: Glucose reference range applies only to samples taken after fasting for at least 8 hours.  Glucose, capillary     Status: Abnormal   Collection Time: 04/15/24 12:32 PM  Result Value Ref Range   Glucose-Capillary 137 (H) 70 - 99 mg/dL    Comment: Glucose reference range applies only to samples taken after fasting for at least 8 hours.   Comment 1 Notify RN    Comment 2 Document in Chart   Glucose, capillary     Status: Abnormal   Collection Time: 04/16/24 12:28 AM  Result Value Ref Range   Glucose-Capillary 149 (H) 70 - 99 mg/dL    Comment: Glucose reference range applies only to samples taken after fasting for at least 8 hours.  CBC     Status: Abnormal   Collection Time: 04/16/24  4:33 AM  Result Value Ref Range   WBC 10.1 4.0 - 10.5 K/uL   RBC 3.04 (L) 4.22 - 5.81 MIL/uL   Hemoglobin 8.8 (L) 13.0 - 17.0 g/dL   HCT 60.4 (L) 54.0 - 98.1 %   MCV 88.5 80.0 - 100.0 fL   MCH 28.9 26.0 - 34.0 pg   MCHC 32.7 30.0 - 36.0 g/dL   RDW 19.1 47.8 - 29.5 %   Platelets 152 150 - 400 K/uL   nRBC 0.0 0.0 - 0.2 %    Comment: Performed at [facility], [address] [place], [place] [address]  Comprehensive metabolic panel     Status: Abnormal   Collection Time: 04/16/24  4:33 AM  Result Value Ref Range   Sodium 143 135 - 145 mmol/L   Potassium 3.1 (L) 3.5 - 5.1 mmol/L   Chloride 112 (H) 98 - 111 mmol/L   CO2 25 22 - 32 mmol/L   Glucose, Bld 167 (H) 70 - 99 mg/dL    Comment: Glucose reference  range applies only to samples taken after fasting for at least 8 hours.   BUN 57 (H) 8 - 23 mg/dL   Creatinine, Ser 8.65 0.61 - 1.24 mg/dL   Calcium  8.6 (L) 8.9 - 10.3 mg/dL   Total Protein 5.4 (L) 6.5 - 8.1 g/dL   Albumin  2.1 (L) 3.5 - 5.0 g/dL   AST 22 15 - 41 U/L   ALT 11 0 - 44 U/L   Alkaline Phosphatase 51 38 - 126 U/L   Total Bilirubin 1.3 (H) 0.0 - 1.2 mg/dL   GFR, Estimated >78 >46 mL/min    Comment: (NOTE) Calculated using the CKD-EPI Creatinine Equation (2021)    Anion gap 6 5 - 15    Comment: Performed at [facility], [address], [place], [place] [address]  Magnesium      Status: None   Collection Time: 04/16/24  4:33 AM  Result Value Ref Range   Magnesium  2.2 1.7 - 2.4 mg/dL    Comment: Performed at [facility], [address], [place], [place] [address]  Phosphorus     Status: None   Collection Time: 04/16/24  4:33 AM  Result Value Ref Range   Phosphorus 2.9 2.5 - 4.6 mg/dL    Comment: Performed  at [facility], [address] [place], [place] [address]  Glucose, capillary     Status: Abnormal   Collection Time: 04/16/24  6:09 AM  Result Value Ref Range   Glucose-Capillary 159 (H) 70 - 99 mg/dL    Comment: Glucose reference range applies only to samples taken after fasting for at least 8 hours.    Imaging / Studies: DG Abd 1 View Result Date: 04/15/2024 CLINICAL DATA:  NG tube placement EXAM: ABDOMEN - 1 VIEW COMPARISON:  04/13/2024 FINDINGS: Limited field of view for tube placement verification purposes. An enteric tube is present with tip projecting over the left upper quadrant consistent with location in the body of the stomach. Surgical clips in the right upper quadrant. Visualized bowel gas pattern is normal. Linear atelectasis in the lung bases. IMPRESSION: Enteric tube tip projects over the left upper quadrant consistent with location in the body of the stomach. Electronically Signed   By: Boyce Byes M.D.   On: 04/15/2024 17:57   DG CHEST PORT 1 VIEW Result Date: 04/15/2024 CLINICAL DATA:  142230 Pleural effusion 142230 EXAM: PORTABLE CHEST - 1 VIEW COMPARISON:  04/13/2024 FINDINGS: 5 right arm PICC to the cavoatrial junction. Possibly low lung volumes with some increase in perihilar patchy opacities. Heart size and mediastinal contours are within normal limits. No definite effusion. Visualized bones unremarkable. IMPRESSION: Low lung volumes with some increase in perihilar patchy opacities. Electronically Signed   By: Nicoletta Barrier M.D.   On: 04/15/2024 08:01   ECHOCARDIOGRAM COMPLETE Result Date: 04/14/2024    ECHOCARDIOGRAM REPORT   Patient Name:   ZELIG GACEK Date of Exam: 04/14/2024 Medical Rec #:  [id number]          Height:       71.0 in Accession #:    [id number]         Weight:       240.0 lb Date of Birth:  September 30, 1951          BSA:          2.278 m Patient Age:    72 years           BP:           122/56 mmHg Patient Gender: M                  HR:           110 bpm. Exam Location:  Inpatient Procedure: 2D Echo, Intracardiac Opacification Agent, Cardiac Doppler and Color            Doppler (Both Spectral and Color Flow Doppler were utilized during            procedure). Indications:    Abnormal ECG R94.31  History:        Patient has no prior history of Echocardiogram examinations.                 Abnormal ECG, Arrythmias:Tachycardia; Risk Factors:Sleep Apnea                 and Former Smoker.  Sonographer:    Kip Peon RDCS Referring Phys: [id number] DAVID MANUEL ORTIZ  Sonographer Comments: Technically challenging study due to limited acoustic windows. Image acquisition challenging due to uncooperative patient and Image acquisition challenging due to respiratory motion. IMPRESSIONS  1. Left ventricular ejection fraction, by estimation, is 65 to 70%. The left ventricle has hyperdynamic function. The left ventricle has no regional wall motion abnormalities.  Left ventricular diastolic  parameters are consistent with Grade I diastolic dysfunction (impaired relaxation).  2. Right ventricular systolic function is normal. The right ventricular size is not well visualized. There is normal pulmonary artery systolic pressure. The estimated right ventricular systolic pressure is 29.4 mmHg.  3. Left atrial size was mildly dilated.  4. The mitral valve is grossly normal. No evidence of mitral valve regurgitation. No evidence of mitral stenosis.  5. The aortic valve is tricuspid. There is mild calcification of the aortic valve. There is mild thickening of the aortic valve. Aortic valve regurgitation is not visualized. Aortic valve sclerosis/calcification is present, without any evidence of aortic stenosis. FINDINGS  Left Ventricle: Left ventricular ejection fraction, by estimation, is 65 to 70%. The left ventricle has hyperdynamic function. The left ventricle has no regional wall motion abnormalities. Definity contrast agent was given IV to delineate the left ventricular endocardial borders. The left ventricular internal cavity size was normal in size. There is borderline concentric left ventricular hypertrophy. Left ventricular diastolic parameters are consistent with Grade I diastolic dysfunction (impaired relaxation). Normal left ventricular filling pressure. Right Ventricle: The right ventricular size is not well visualized. Right vetricular wall thickness was not well visualized. Right ventricular systolic function is normal. There is normal pulmonary artery systolic pressure. The tricuspid regurgitant velocity is 2.47 m/s, and with an assumed right atrial pressure of 5 mmHg, the estimated right ventricular systolic pressure is 29.4 mmHg. Left Atrium: Left atrial size was mildly dilated. Right Atrium: Right atrial size was not well visualized. Pericardium: There is no evidence of pericardial effusion. Mitral Valve: The mitral valve is grossly normal. No evidence of mitral valve regurgitation. No  evidence of mitral valve stenosis. Tricuspid Valve: The tricuspid valve is grossly normal. Tricuspid valve regurgitation is mild. Aortic Valve: The aortic valve is tricuspid. There is mild calcification of the aortic valve. There is mild thickening of the aortic valve. Aortic valve regurgitation is not visualized. Aortic valve sclerosis/calcification is present, without any evidence of aortic stenosis. Pulmonic Valve: The pulmonic valve was not well visualized. Pulmonic valve regurgitation is not visualized. No evidence of pulmonic stenosis. Aorta: The aortic root and ascending aorta are structurally normal, with no evidence of dilitation. Venous: The inferior vena cava was not well visualized. IAS/Shunts: The interatrial septum was not well visualized.  LEFT VENTRICLE PLAX 2D LVIDd:         4.50 cm   Diastology LVIDs:         3.30 cm   LV e' medial:    7.00 cm/s LV PW:         1.30 cm   LV E/e' medial:  10.3 LV IVS:        1.20 cm   LV e' lateral:   7.00 cm/s LVOT diam:     2.00 cm   LV E/e' lateral: 10.3 LV SV:         61 LV SV Index:   27 LVOT Area:     3.14 cm  RIGHT VENTRICLE RV S prime:     15.30 cm/s TAPSE (M-mode): 1.2 cm LEFT ATRIUM             Index LA diam:        4.16 cm 1.83 cm/m LA Vol (A2C):   35.9 ml 15.76 ml/m LA Vol (A4C):   39.1 ml 17.16 ml/m LA Biplane Vol: 37.3 ml 16.37 ml/m  AORTIC VALVE LVOT Vmax:   135.00 cm/s LVOT Vmean:  90.000 cm/s LVOT  VTI:    0.193 m  AORTA Ao Root diam: 3.30 cm Ao Asc diam:  3.50 cm MITRAL VALVE                TRICUSPID VALVE MV Area (PHT): 4.96 cm     TR Peak grad:   24.4 mmHg MV Decel Time: 153 msec     TR Vmax:        247.00 cm/s MV E velocity: 72.20 cm/s MV A velocity: 106.00 cm/s  SHUNTS MV E/A ratio:  0.68         Systemic VTI:  0.19 m                             Systemic Diam: 2.00 cm Mihai Croitoru MD Electronically signed by Luana Rumple MD Signature Date/Time: 04/14/2024/2:55:48 PM    Final     Medications / Allergies: per  chart  Antibiotics: Anti-infectives (From admission, onward)    Start     Dose/Rate Route Frequency Ordered Stop   04/14/24 1800  ceFEPIme  (MAXIPIME ) 2 g in sodium chloride  0.9 % 100 mL IVPB        2 g 200 mL/hr over 30 Minutes Intravenous Every 8 hours 04/14/24 1003     04/14/24 1600  vancomycin  (VANCOCIN ) IVPB 1000 mg/200 mL premix  Status:  Discontinued        1,000 mg 200 mL/hr over 60 Minutes Intravenous Every 24 hours 04/13/24 1420 04/14/24 1000   04/14/24 1600  vancomycin  (VANCOREADY) IVPB 1500 mg/300 mL        1,500 mg 150 mL/hr over 120 Minutes Intravenous Every 24 hours 04/14/24 1002     04/14/24 1200  vancomycin  (VANCOCIN ) IVPB 1000 mg/200 mL premix  Status:  Discontinued        1,000 mg 200 mL/hr over 60 Minutes Intravenous Every 24 hours 04/13/24 1053 04/13/24 1109   04/13/24 2200  ceFEPIme  (MAXIPIME ) 2 g in sodium chloride  0.9 % 100 mL IVPB  Status:  Discontinued        2 g 200 mL/hr over 30 Minutes Intravenous Every 12 hours 04/13/24 1036 04/13/24 1109   04/13/24 2200  ceFEPIme  (MAXIPIME ) 2 g in sodium chloride  0.9 % 100 mL IVPB  Status:  Discontinued        2 g 200 mL/hr over 30 Minutes Intravenous Every 12 hours 04/13/24 1425 04/14/24 1003   04/13/24 1500  vancomycin  (VANCOCIN ) 2,000 mg in sodium chloride  0.9 % 500 mL IVPB        2,000 mg 260 mL/hr over 120 Minutes Intravenous  Once 04/13/24 1409 04/13/24 1850   04/13/24 1500  ceFEPIme  (MAXIPIME ) 2 g in sodium chloride  0.9 % 100 mL IVPB  Status:  Discontinued        2 g 200 mL/hr over 30 Minutes Intravenous Every 12 hours 04/13/24 1420 04/13/24 1425   04/13/24 1500  metroNIDAZOLE  (FLAGYL ) IVPB 500 mg        500 mg 100 mL/hr over 60 Minutes Intravenous Every 12 hours 04/13/24 1420     04/13/24 1200  vancomycin  (VANCOCIN ) 2,000 mg in sodium chloride  0.9 % 500 mL IVPB  Status:  Discontinued        2,000 mg 260 mL/hr over 120 Minutes Intravenous  Once 04/13/24 1052 04/13/24 1121   04/13/24 1100  metroNIDAZOLE  (FLAGYL )  IVPB 500 mg  Status:  Discontinued        500 mg 100 mL/hr over 60 Minutes  Intravenous 2 times daily 04/13/24 1007 04/13/24 1109   04/13/24 1100  vancomycin  (VANCOCIN ) IVPB 1000 mg/200 mL premix  Status:  Discontinued        1,000 mg 200 mL/hr over 60 Minutes Intravenous  Once 04/13/24 1007 04/13/24 1020   04/13/24 1015  ceFEPIme  (MAXIPIME ) 2 g in sodium chloride  0.9 % 100 mL IVPB        2 g 200 mL/hr over 30 Minutes Intravenous STAT 04/13/24 1007 04/14/24 1721   04/10/24 2200  ceFAZolin  (ANCEF ) IVPB 2g/100 mL premix        2 g 200 mL/hr over 30 Minutes Intravenous Every 8 hours 04/10/24 1833 04/11/24 0531   04/10/24 0600  ceFAZolin  (ANCEF ) IVPB 2g/100 mL premix        2 g 200 mL/hr over 30 Minutes Intravenous On call to O.R. 04/10/24 0533 04/10/24 1526         Note: Portions of this report may have been transcribed using voice recognition software. Every effort was made to ensure accuracy; however, inadvertent computerized transcription errors may be present.   Any transcriptional errors that result from this process are unintentional.    Eddye Goodie, MD, FACS, MASCRS Esophageal, Gastrointestinal & Colorectal Surgery Robotic and Minimally Invasive Surgery  [facility] [address], [address] [place], [place] [address] [phone] Fax [phone] Main  CONTACT INFORMATION: Weekday (9AM-5PM): Call [facility] main office at [phone] Weeknight (5PM-9AM) or Weekend/Holiday: Check EPIC "Web Links" tab & use "AMION" (password " [email or web address]") for General Surgery [facility] coverage  Please, DO NOT use SecureChat  (it is not reliable communication to reach operating surgeons & will lead to a delay in care).   Epic staff messaging available for outptient concerns needing 1-2 business day response.      04/16/2024  7:20 AM

## 2024-04-16 NOTE — Plan of Care (Signed)
  Problem: Education: Goal: Knowledge of General Education information will improve Description: Including pain rating scale, medication(s)/side effects and non-pharmacologic comfort measures Outcome: Progressing   Problem: Health Behavior/Discharge Planning: Goal: Ability to manage health-related needs will improve Outcome: Progressing   Problem: Clinical Measurements: Goal: Ability to maintain clinical measurements within normal limits will improve Outcome: Progressing Goal: Respiratory complications will improve Outcome: Progressing   Problem: Coping: Goal: Ability to adjust to condition or change in health will improve Outcome: Progressing

## 2024-04-16 NOTE — Plan of Care (Signed)
  Problem: Clinical Measurements: Goal: Will remain free from infection Outcome: Progressing   Problem: Nutrition: Goal: Adequate nutrition will be maintained Outcome: Progressing   Problem: Elimination: Goal: Will not experience complications related to urinary retention Outcome: Progressing   Problem: Safety: Goal: Ability to remain free from injury will improve Outcome: Progressing   Problem: Fluid Volume: Goal: Ability to maintain a balanced intake and output will improve Outcome: Progressing   Problem: Fluid Volume: Goal: Hemodynamic stability will improve Outcome: Progressing   Problem: Respiratory: Goal: Ability to maintain adequate ventilation will improve Outcome: Progressing

## 2024-04-16 NOTE — Progress Notes (Signed)
 Patient sat on edge of bed for 5 minutes. HR increased to low 110s. Patient states that he doesn't feel any pain in abdomen with movement just pressure. Abdominal binder is in place. All pain is related to NG tube movement.

## 2024-04-16 NOTE — Progress Notes (Addendum)
 PHARMACY - TOTAL PARENTERAL NUTRITION CONSULT NOTE   Indication: Prolonged ileus  Patient Measurements: Height: 5\' 11"  (180.3 cm) Weight: 98.4 kg (216 lb 14.9 oz) IBW/kg (Calculated) : 75.3 TPN AdjBW (KG): 83.7 Body mass index is 30.26 kg/m.  Assessment: Pt is a 30 yoM who underwent hernia repair and urostomy revision on 04/10/24. Pt developed post-op ileus. Pharmacy consulted to manage TPN.   Glucose / Insulin : Hx DM on metformin PTA. A1c 5.3% -CBGs 126-167 in last 24hrs -9 units of insulin  used in last 24hrs Electrolytes:  -Na 143 (upper end of normal range) -K 3.1 (low, likely due to high NG output and diuresis) -All other lytes WNL, including CoCa at 10  Renal: SCr 1.22 (stable); BUN 57 (elevated)  Hepatic: -Albumin  2.1 (low) -Tbili 1.3 (slightly elevated) -LFTs, alk phos WNL Intake / Output; MIVF:  -PO: +36mL -UOP: -4.1L  -NG/emesis: - (NG reinserted 6/4 PM) -Drain -145 mL -Stool: 1x GI Imaging: -6/3: Right lower quadrant small bowel loop with mural defect and adjacent extraluminal gas identified, which is concerning for underlying small bowel perforation. GI Surgeries / Procedures:  -5/30: Hernia repair, lysis of adhesions, urostomy revision  Central access: PICC TPN start date: 6/2   Nutritional Goals:  Goal TPN rate is 90 mL/hr (provides 101.5 g of protein and 2047 kcals per day)  RD Assessment: 6/2  Estimated Needs Total Energy Estimated Needs: 1950-2150 Total Protein Estimated Needs: 95-110g Total Fluid Estimated Needs: 2.1L/day  Current Nutrition:  TPN  NPO except ice chips  Plan:  IV Kcl 10mEq x4, per surgery At 1800, continue TPN at goal 90 mL/hr Electrolytes in TPN:  Na 40 mEq/L - decreased K 55 mEq/L - increased Ca 5 mEq/L Mg 2 mEq/L - increased (taken out of yesterday's TPN) Phos 20 mmol/L Cl:Ac 1:1 Add standard MVI and trace elements to TPN Continue Moderate q6h SSI and adjust as needed  Thiamine 100 mg IV daily x 6 days through  6/7  Monitor TPN labs on Mon/Thurs, and PRN   Roselee Cong, PharmD Clinical Pharmacist  6/5/20257:37 AM

## 2024-04-16 NOTE — Evaluation (Signed)
 SLP Cancellation Note  Patient Details Name: Kristopher Thompson MRN: [id number] DOB: [birth year]   Cancelled treatment:       Reason Eval/Treat Not Completed: Other (comment) (pt now with NG replaced, will continue efforts)  Maudie Sorrow, MS [facility] SLP Acute Rehab Services Office [phone]   Chantal Comment 04/16/2024, 7:57 AM

## 2024-04-17 ENCOUNTER — Encounter (HOSPITAL_COMMUNITY): Payer: Self-pay | Admitting: Surgery

## 2024-04-17 DIAGNOSIS — G9340 Encephalopathy, unspecified: Secondary | ICD-10-CM | POA: Diagnosis not present

## 2024-04-17 DIAGNOSIS — K430 Incisional hernia with obstruction, without gangrene: Secondary | ICD-10-CM | POA: Diagnosis not present

## 2024-04-17 DIAGNOSIS — I5030 Unspecified diastolic (congestive) heart failure: Secondary | ICD-10-CM | POA: Diagnosis not present

## 2024-04-17 DIAGNOSIS — J9601 Acute respiratory failure with hypoxia: Secondary | ICD-10-CM | POA: Diagnosis not present

## 2024-04-17 LAB — CBC
HCT: 31.8 % — ABNORMAL LOW (ref 39.0–52.0)
Hemoglobin: 9.8 g/dL — ABNORMAL LOW (ref 13.0–17.0)
MCH: 28.6 pg (ref 26.0–34.0)
MCHC: 30.8 g/dL (ref 30.0–36.0)
MCV: 92.7 fL (ref 80.0–100.0)
Platelets: 180 10*3/uL (ref 150–400)
RBC: 3.43 MIL/uL — ABNORMAL LOW (ref 4.22–5.81)
RDW: 15.5 % (ref 11.5–15.5)
WBC: 13.7 10*3/uL — ABNORMAL HIGH (ref 4.0–10.5)
nRBC: 0 % (ref 0.0–0.2)

## 2024-04-17 LAB — BASIC METABOLIC PANEL WITH GFR
Anion gap: 8 (ref 5–15)
BUN: 62 mg/dL — ABNORMAL HIGH (ref 8–23)
CO2: 24 mmol/L (ref 22–32)
Calcium: 9 mg/dL (ref 8.9–10.3)
Chloride: 118 mmol/L — ABNORMAL HIGH (ref 98–111)
Creatinine, Ser: 1.3 mg/dL — ABNORMAL HIGH (ref 0.61–1.24)
GFR, Estimated: 59 mL/min — ABNORMAL LOW (ref >60)
Glucose, Bld: 162 mg/dL — ABNORMAL HIGH (ref 70–99)
Potassium: 3.8 mmol/L (ref 3.5–5.1)
Sodium: 150 mmol/L — ABNORMAL HIGH (ref 135–145)

## 2024-04-17 LAB — GLUCOSE, CAPILLARY
Glucose-Capillary: 153 mg/dL — ABNORMAL HIGH (ref 70–99)
Glucose-Capillary: 146 mg/dL — ABNORMAL HIGH (ref 70–99)
Glucose-Capillary: 140 mg/dL — ABNORMAL HIGH (ref 70–99)

## 2024-04-17 LAB — PHOSPHORUS: Phosphorus: 2.7 mg/dL (ref 2.5–4.6)

## 2024-04-17 LAB — MAGNESIUM: Magnesium: 2.1 mg/dL (ref 1.7–2.4)

## 2024-04-17 LAB — HEPARIN LEVEL (UNFRACTIONATED): Heparin Unfractionated: <0.1 [IU]/mL — ABNORMAL LOW (ref 0.30–0.70)

## 2024-04-17 MED ORDER — TRAVASOL 10 % IV SOLN
INTRAVENOUS | Status: AC
  Filled 2024-04-17: qty 1015.2

## 2024-04-17 MED ORDER — HEPARIN (PORCINE) 25000 UT/250ML-% IV SOLN
2150.0000 [IU]/h | INTRAVENOUS | Status: DC
  Administered 2024-04-17: 1700 [IU]/h via INTRAVENOUS
  Administered 2024-04-17: 1950 [IU]/h via INTRAVENOUS
  Administered 2024-04-18 – 2024-04-19 (×3): 2150 [IU]/h via INTRAVENOUS
  Filled 2024-04-17 (×5): qty 250

## 2024-04-17 MED ORDER — METOCLOPRAMIDE HCL 5 MG/ML IJ SOLN
10.0000 mg | Freq: Three times a day (TID) | INTRAMUSCULAR | Status: AC
  Administered 2024-04-17 – 2024-04-19 (×9): 10 mg via INTRAVENOUS
  Filled 2024-04-17 (×9): qty 2

## 2024-04-17 NOTE — Progress Notes (Signed)
 PHYSICAL THERAPY  Assisted Pt back to bed with NT due to + 2 assist.  Pt was able to rise out of recliner + 2 side by side hand held assist and take a few pivot steps back to bed able to support his own weight and safely back step.  Pt did require assist B LE back up onto bed but was able to bridge and scoot to center.  Noted Urostomy leaking at site/wet gown.  NT notified RN.  Bess Broody  PTA Acute  Rehabilitation Services Office M-F          [phone]

## 2024-04-17 NOTE — Progress Notes (Signed)
 PHARMACY - ANTICOAGULATION CONSULT NOTE  Pharmacy Consult for IV heparin  Indication: pulmonary embolus  Allergies  Allergen Reactions   Demerol  [Meperidine ] Nausea And Vomiting and Other (See Comments)    SEVERE NAUSEA    Patient Measurements: Height: 5\' 11"  (180.3 cm) Weight: 98.4 kg (216 lb 14.9 oz) IBW/kg (Calculated) : 75.3 HEPARIN  DW (KG): 98.5  Vital Signs: Temp: 97.6 F (36.4 C) (06/06 0827) Temp Source: Oral (06/06 0827) BP: 156/71 (06/06 0800) Pulse Rate: 91 (06/06 0800)  Labs: Recent Labs    04/14/24 1141 04/14/24 1615 04/15/24 0444 04/16/24 0433 04/17/24 0505  HGB  --    < > 8.6* 8.8* 9.8*  HCT  --    < > 26.3* 26.9* 31.8*  PLT  --   --  119* 152 180  LABPROT 19.0*  --   --   --   --   INR 1.6*  --   --   --   --   CREATININE  --    < > 1.19 1.22 1.30*   < > = values in this interval not displayed.    Estimated Creatinine Clearance: 62.3 mL/min (A) (by C-G formula based on SCr of 1.3 mg/dL (H)).   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   moderate atypia - right low back   Atypical nevus 04/04/2007   moderate to marked - right upper back (wider shave)   Atypical nevus 04/04/2007   moderate atypia - center chest (wider shave)   Atypical nevus 04/04/2007   slight atypia - right thigh   Atypical nevus 11/29/2011   mild atypia - center upper back   Atypical nevus 11/29/2011   mild atypia - center chest   Bacteremia due to Klebsiella pneumoniae 10/09/2017   Bladder cancer (HCC) dx 07/2017   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 denies   Sleep apnea    cpap   Squamous cell carcinoma of skin 05/22/2013   left cheek - CX3 + 5FU   Wears glasses     Medications:  Medications Prior to Admission  Medication Sig Dispense Refill Last Dose/Taking   acetaminophen  (TYLENOL ) 500 MG tablet Take 1,000 mg by mouth every 8 (eight) hours as needed for mild pain or headache.    Past Week   bisacodyl  (DULCOLAX) 5 MG EC tablet Take 2 tablets (10 mg total) by mouth daily as needed for moderate constipation. (Patient taking differently: Take 15 mg by mouth daily.) 20 tablet 0 Past Week   Cyanocobalamin  (VITAMIN B-12 PO) Place 1 tablet under the tongue 3 (three) times a week.   Past Week   D-MANNOSE PO Take 2-3 tablets by mouth See admin instructions. Take 2 tablets by mouth every morning & take 1 tablet by  mouth at bedtime.   Past Week   diphenhydrAMINE  (BENADRYL ) 25 mg capsule Take 25 mg by mouth every other day as needed (congestion/allergies (every other night)).   Past Week   ELIQUIS 5 MG TABS tablet Take 5 mg by mouth 2 (two) times daily.   04/07/2024 Evening   ipratropium (ATROVENT ) 0.03 % nasal spray Place 1 spray into both nostrils 3 (three) times daily as needed (congestion).   Past Week   metFORMIN (GLUCOPHAGE) 500 MG tablet Take 500 mg by mouth in the morning.   04/09/2024   Multiple Vitamins-Minerals (CENTRUM MINIS ADULTS 50+) TABS Take 1 tablet by mouth See admin instructions. Take 1 tablet by mouth twice daily on every other day.   Past Week   Omega-3 Fatty Acids (FISH OIL PO) Take 1 capsule by mouth 3 (three) times a week.   Past Week   polyethylene glycol (MIRALAX ) 17 g packet Take 17 g by mouth daily as needed (constipation.).   Past Week   rosuvastatin  (CRESTOR ) 10 MG tablet Take 10 mg by mouth  every evening.   04/09/2024   triamcinolone  cream (KENALOG ) 0.1 % Apply 1 application topically daily as needed. 60 g 3 Past Week    Assessment: Pharmacy is consulted to dose heparin  for 73 yo male with PMH that includes PE. On apixaban PTA. Med has not been resumed as inpatient. Last dose PTA was on 5/27.   Per [place] discussion with CCM, will avoid boluses due to concerns with ABLA and transfusions during admission.   Today, 04/17/24 Hgb 9.8, plt 180  Scr 1.30 mg/dl, CrCl 62 ml/min    Goal of Therapy:  No bolus  Heparin  level 0.3-0.7 units/ml Monitor platelets by anticoagulation protocol: Yes   Plan:  Start heparin  1700 units/hr  Obtain HL 8 hours after start of infusion Daily CBC, HL  Monitor for signs and symptoms of bleeding   Van Gelinas, PharmD, BCPS 04/17/2024 9:07 AM

## 2024-04-17 NOTE — Progress Notes (Signed)
 During rounding, patient found sitting on the edge of the bed, stated, " I want to take this tube out of my nose". Patient redirected and assisted with getting back in bed. Call bell within reach. Bed alarm turned on. Plan of care ongoing.

## 2024-04-17 NOTE — Progress Notes (Signed)
 PHARMACY - TOTAL PARENTERAL NUTRITION CONSULT NOTE   Indication: Prolonged ileus  Patient Measurements: Height: 5\' 11"  (180.3 cm) Weight: 98.4 kg (216 lb 14.9 oz) IBW/kg (Calculated) : 75.3 TPN AdjBW (KG): 83.7 Body mass index is 30.26 kg/m.  Assessment: Pt is a 45 yoM who underwent hernia repair and urostomy revision on 04/10/24. Pt developed post-op ileus. Pharmacy consulted to manage TPN.   Glucose / Insulin : Hx DM on metformin PTA. A1c 5.3% -CBGs 137-161 in last 24hrs -11 units of insulin  used in last 24hrs Electrolytes:  -Na 150 (elevated) - CorrCa slightly elevated at 10.5  -All other lytes WNL  Renal: SCr 1.30 (stable); BUN 62 (elevated)  Hepatic: -Albumin  2.1 (low) -Tbili 1.3 (slightly elevated) -LFTs, alk phos WNL Intake / Output; MIVF:  -UOP: -3.8L  -NG/emesis: - (NG reinserted 6/4 PM) -Drain -175 mL -Stool: 1x GI Imaging: -6/3: Right lower quadrant small bowel loop with mural defect and adjacent extraluminal gas identified, which is concerning for underlying small bowel perforation. GI Surgeries / Procedures:  -5/30: Hernia repair, lysis of adhesions, urostomy revision  Central access: PICC TPN start date: 6/2   Nutritional Goals:  Goal TPN rate is 90 mL/hr (provides 101.5 g of protein and 2047 kcals per day)  RD Assessment: 6/2  Estimated Needs Total Energy Estimated Needs: 1950-2150 Total Protein Estimated Needs: 95-110g Total Fluid Estimated Needs: 2.1L/day  Current Nutrition:  TPN  NPO except ice chips  Plan: Now   IV Kcl 10mEq x2, to target K+ of >4    At 1800, continue TPN at goal 90 mL/hr Electrolytes in TPN:  Na 0 mEq/L - decreased, removed K 60 mEq/L - increased Ca 2 mEq/L, Decreased Mg 2 mEq/L   Phos 20 mmol/L Cl:Ac 1:1 Add standard MVI and trace elements to TPN Continue Moderate q6h SSI and adjust as needed  Thiamine 100 mg IV daily x 6 days through 6/7  BMP, magnesium , phosphorus with AM labs  Monitor TPN labs on  Mon/Thurs, and PRN     Nikalas Bramel, PharmD, BCPS 04/17/2024 9:08 AM

## 2024-04-17 NOTE — Progress Notes (Signed)
 NAME:  Kristopher Thompson, MRN:  [id number], DOB:  December 22, 1950, LOS: 7 ADMISSION DATE:  04/10/2024, CONSULTATION DATE:  04/13/2024 REFERRING MD:  Dr Bonita Bussing, CHIEF COMPLAINT: Sepsis  History of Present Illness:  S/p repair of incisional/parastomal and ventral hernias, left inguinal incarcerated , abdominal hernias, lysis of adhesions, urostomy revision Day 1 postoperatively  Transferred to the ICU secondary to tachycardia, tachypnea  History of bladder cancer, prostate cancer, diastolic heart failure, diabetes, right bundle branch block, multiple abdominal surgeries  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   moderate atypia - right low back   Atypical nevus 04/04/2007   moderate to marked - right upper back (wider shave)   Atypical nevus 04/04/2007   moderate atypia - center chest (wider shave)   Atypical nevus 04/04/2007   slight atypia - right thigh   Atypical nevus 11/29/2011   mild atypia - center upper back   Atypical nevus 11/29/2011   mild atypia - center chest   Bladder cancer (HCC) dx 07/2017   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 denies   Sleep apnea    cpap   Squamous cell carcinoma of skin 05/22/2013   left cheek - CX3 + 5FU   Wears glasses    Significant Hospital Events: Including procedures, antibiotic start  and stop dates in addition to other pertinent events   04/10/2024-laparoscopic surgery 04/13/24 CCM consult. Incr NE. Art line  6/3 weaning NE. Gently diuresed w 3L off  6/4 off NE. Pulled out his NGT refused replacement. After multiple emesis  6/5 cont NGT 6/6 tx to med surg  Interim History / Subjective:  NAEO   Hgb 9.6  On RA Wants more ice chips   Objective    Blood pressure (!) 156/71, pulse 91, temperature 98.4 F (36.9 C), temperature source Oral, resp. rate (!) 27, height 5\' 11"  (1.803 m), weight 98.4 kg, SpO2 95%.        Intake/Output Summary (Last 24 hours) at 04/17/2024 0821 Last data filed at 04/17/2024 4098 Gross per 24 hour  Intake 3133.27 ml  Output 5125 ml  Net -1991.73 ml   Filed Weights   04/10/24 0536 04/16/24 0500  Weight: 108.9 kg 98.4 kg    Examination: General: wdwn chronically and acutely ill older adult M  HENT: NCAT NGT in place  Lungs: Even unlabored on RA  Cardiovascular: rrr Abdomen: soft ndnt  Extremities: trace edema. RUE P:ICC  Neuro: slightly lethargic, following commands  GU: yellow urine   Resolved problem list  Shock  Assessment and Plan   Acute metabolic encephalopathy  -CT H without acute intracranial abnormality  P -delirium precautions  Incarcerated abd wall hernia a/p LOA hernia repairs urostomy ileal conduit revision Post op  ileus P -cont NGT to LIWS  -TPN -multimodal analgesia -PT/OT, mobility  -5d course flagyl  cefepime    Acute hypoxic resp failure, improving R pleural effusion Hx OSA on CPAP  P -PRN CXR -PRN diuresis -- defer 6/6; O2 req much improved  -CPAP   Diastolic HF -acutely managing volume status -outpt cards f/u   HypoK Hypophos P -BMP mag phos pending 6/6    ABLA on chronic anemia  R flank hematoma  Hx PE on eliquis  P -at some point ned to get pt back on systemic AC (home takes eliquis)-- ultimately defer timing to [facility], but think we could likely start hep gtt 6/6 given his h/h  stability the last couple days (NPO, unable to give eliquis) -waiting for [facility] office to open so I can get a hold of primary to discuss   Hx bladder ca  -supportive care    Dispo: -txf to med surg 6/6     PCCM will s/o please re-engage if pt clinical status changes or if we can be of further assistance    Best Practice (right click and "Reselect all SmartList Selections" daily)   Diet/type: NPO  DVT prophylaxis SCD lovenox   --- at some point needs to get back on systemic AC (hep gtt while he cannot take PO) Pressure ulcer(s): N/A GI prophylaxis: N/A Lines: Central line,PICC art line  Foley:  Yes, and it is still needed Code Status:  full code Last date of multidisciplinary goals of care discussion [d/w family at bedside 6/5  Labs   CBC: Recent Labs  Lab 04/13/24 1835 04/14/24 0941 04/14/24 1615 04/15/24 0444 04/16/24 0433 04/17/24 0505  WBC 10.8* 7.9  --  7.2 10.1 13.7*  NEUTROABS 8.5*  --   --   --   --   --   HGB 8.5* 7.6* 8.6* 8.6* 8.8* 9.8*  HCT 26.4* 23.7* 26.1* 26.3* 26.9* 31.8*  MCV 86.8 84.9  --  88.6 88.5 92.7  PLT 152 127*  --  119* 152 180    Basic Metabolic Panel: Recent Labs  Lab 04/12/24 0437 04/13/24 0421 04/14/24 0601 04/14/24 1825 04/15/24 0444 04/16/24 0433 04/17/24 0505  NA 133*   < > 134* 137 140 143 150*  K 5.1   < > 3.6 3.6 3.3* 3.1* 3.8  CL 103   < > 105 105 108 112* 118*  CO2 22   < > 20* 20* 23 25 24   GLUCOSE 163*   < > 142* 105* 134* 167* 162*  BUN 40*   < > 46* 41* 41* 57* 62*  CREATININE 2.08*   < > 1.27* 1.25* 1.19 1.22 1.30*  CALCIUM  8.5*   < > 8.2* 8.1* 8.2* 8.6* 9.0  MG 2.1  --  2.3  --  2.6* 2.2 2.1  PHOS 2.7  --  1.9*  --  2.3* 2.9 2.7   < > = values in this interval not displayed.   GFR: Estimated Creatinine Clearance: 62.3 mL/min (A) (by C-G formula based on SCr of 1.3 mg/dL (H)). Recent Labs  Lab 04/13/24 0713 04/13/24 1047 04/13/24 1414 04/13/24 1835 04/14/24 0601 04/14/24 0941 04/15/24 0444  04/16/24 0433 04/17/24 0505  WBC  --   --   --    < >  --  7.9 7.2 10.1 13.7*  LATICACIDVEN 3.8* 5.1* 5.4*  --  2.1*  --   --   --   --    < > = values in this interval not displayed.  Liver Function Tests: Recent Labs  Lab 04/13/24 1047 04/13/24 1835 04/16/24 0433  AST 25 29 22   ALT 8 9 11   ALKPHOS 50 41 51  BILITOT 1.0 1.6* 1.3*  PROT 5.6* 5.4* 5.4*  ALBUMIN  2.6* 2.7* 2.1*   No results for input(s): "LIPASE", "AMYLASE" in the last 168 hours. No results for input(s): "AMMONIA" in the last 168 hours.  ABG    Component Value Date/Time   PHART 7.44 04/13/2024 1319   PCO2ART 24 (L) 04/13/2024 1319   PO2ART 118 (H) 04/13/2024 1319   HCO3 16.7 (L) 04/13/2024 1319   TCO2 24 08/08/2017 1734   ACIDBASEDEF 6.1 (H) 04/13/2024 1319   O2SAT 100 04/13/2024 1319     Coagulation Profile: Recent Labs  Lab 04/13/24 1047 04/14/24 1141  INR 1.6* 1.6*    Cardiac Enzymes: No results for input(s): "CKTOTAL", "CKMB", "CKMBINDEX", "TROPONINI" in the last 168 hours.  HbA1C: Hgb A1c MFr Bld  Date/Time Value Ref Range Status  04/01/2024 10:50 AM 5.3 4.8 - 5.6 % Final    Comment:    (NOTE) Pre diabetes:          5.7%-6.4%  Diabetes:              >6.4%  Glycemic control for   <7.0% adults with diabetes     CBG: Recent Labs  Lab 04/16/24 0609 04/16/24 1129 04/16/24 1715 04/16/24 2320 04/17/24 0622  GLUCAP 159* 144* 161* 158* 153*    CCT n/a   Mod MDM   Eston Hence MSN, AGACNP-BC [facility] Amion for pager  04/17/2024, 8:21 AM

## 2024-04-17 NOTE — Progress Notes (Signed)
   04/17/24 2026  BiPAP/CPAP/SIPAP  BiPAP/CPAP/SIPAP Pt Type Adult  Reason BIPAP/CPAP not in use NG tube in place

## 2024-04-17 NOTE — Plan of Care (Signed)
  Problem: Clinical Measurements: Goal: Respiratory complications will improve Outcome: Progressing   Problem: Nutrition: Goal: Adequate nutrition will be maintained Outcome: Progressing   Problem: Elimination: Goal: Will not experience complications related to urinary retention Outcome: Progressing   Problem: Pain Managment: Goal: General experience of comfort will improve and/or be controlled Outcome: Progressing   Problem: Safety: Goal: Ability to remain free from injury will improve Outcome: Progressing   Problem: Fluid Volume: Goal: Ability to maintain a balanced intake and output will improve Outcome: Progressing   Problem: Fluid Volume: Goal: Hemodynamic stability will improve Outcome: Progressing

## 2024-04-17 NOTE — Evaluation (Signed)
 SLP Cancellation Note  Patient Details Name: Kristopher Thompson MRN: [id number] DOB: 1951/11/05   Cancelled treatment:       Reason Eval/Treat Not Completed: Other (comment);Patient not medically ready (pt continues with NG and is allowed only few ice chips, will continue efforts)   Chantal Comment 04/17/2024, 7:37 AM   Maudie Sorrow, MS [facility] SLP Acute Rehab Services Office [phone]

## 2024-04-17 NOTE — TOC Progression Note (Signed)
 Transition of Care [facility]) - Progression Note    Patient Details  Name: Kristopher Thompson MRN: [id number] Date of Birth: [birth year]  [facility] [place] CM/SW Contact  Tessie Fila, RN Phone Number: 04/17/2024, 10:07 AM  Clinical Narrative:    Pt not medically stable for DC. Pt currently receiving TPN and NPO except for ice chips. Pt has SLP consult, awaiting recommendations. TOC continuing to follow for any new recommendations or DC needs.   Expected Discharge Plan: Home/Self Care Barriers to Discharge: Continued Medical Work up  Expected Discharge Plan and Services   Discharge Planning Services: CM Consult   Living arrangements for the past 2 months: Single Family Home                                       Social Determinants of Health (SDOH) Interventions SDOH Screenings   Food Insecurity: No Food Insecurity (04/12/2024)  Housing: Low Risk  (04/12/2024)  Transportation Needs: No Transportation Needs (04/12/2024)  Utilities: Not At Risk (04/12/2024)  Social Connections: Socially Integrated (04/10/2024)  Tobacco Use: Medium Risk (04/10/2024)    Readmission Risk Interventions    04/12/2024    4:58 PM  Readmission Risk Prevention Plan  Transportation Screening Complete  PCP or Specialist Appt within 3-5 Days Complete  HRI or Home Care Consult Complete  Social Work Consult for Recovery Care Planning/Counseling Complete  Palliative Care Screening Not Applicable  Medication Review Oceanographer) Complete

## 2024-04-17 NOTE — Progress Notes (Signed)

## 2024-04-17 NOTE — Progress Notes (Addendum)
 04/17/2024  Kristopher Thompson [id number] 25-May-1951  CARE TEAM: PCP: Dudley Ghee, MD  Outpatient Care Team: Patient Care Team: Dudley Ghee, MD as PCP - General (Internal Medicine) Devon Fogo, MD (Inactive) as Consulting Physician (Dermatology) Lavina Pou, MD as Referring Physician (Pulmonary Disease) Candyce Champagne, MD as Consulting Physician (General Surgery) Florencio Hunting, MD as Consulting Physician (Urology)  Inpatient Treatment Team: Treatment Team:  Candyce Champagne, MD Pccm, Md, MD Etter Hermann., MD Florencio Hunting, MD Carlyle Childes, RN Chesley Cost, PTA Abbe Abate, RN May, Serita Danes, RN [place], [place], [place] Ezra Holmes, RN Silvestre Drum, RN Tessie Fila, RN   Problem List:   Principal Problem:   Incarcerated incisional hernia Active Problems:   S/P ileal conduit [facility])   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 08/08/2017   GERD (gastroesophageal reflux disease)   Obesity (BMI 35.0-39.9 without comorbidity)   Prolonged QT interval   Non-recurrent bilateral inguinal hernia without obstruction or gangrene   Sinus tachycardia   Tachypnea   Acute respiratory insufficiency, postoperative   Sepsis due to undetermined organism (HCC)   Lactic acidosis   Class 2 obesity   Chronic anticoagulation   Hearing loss   History of bladder cancer   Obstructive sleep apnea of adult   Parastomal hernia of ileal conduit   Partial small bowel obstruction (HCC)   Personal history of PE (pulmonary embolism)   04/10/2024  POST-OPERATIVE DIAGNOSIS:  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   PROCEDURE:   -ROBOTIC LYSIS OF ADHESIONS X 4 HOURS -COMPONENT SEPARATION - TRANSVERSUS ABDOMINIS REALEASE (TAR) BILATERAL -ROBOTIC REPAIRS OF  INCISIONAL, PARASTOMAL, LEFT INGUINAL  INCARCERATED ABDOMINAL WALL HERNIAS WITH MESH -UROSTOMY ILEAL CONDUIT REVISION -INTRAOPERATIVE ASSESSMENT OF TISSUE VASCULAR  PERFUSION USING ICG (indocyanine green ) -IMMUNOFLUORESCENCE -TRANSVERSUS ABDOMINIS PLANE (TAP) BLOCK - BILATERAL    SURGEON:  Eddye Goodie, MD  OR FINDINGS:  Patient had dense adhesions anterior abdominal wall.  Largest hernia was in the right lower quadrant around the ileal conduit urostomy.  16 x 12 cm region.  Incarcerated with over a foot of small bowel.  Midline next largest 9 x 8 cm incarcerated with numerous loops of small bowel.  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  Another direct space hernia in the left lower quadrant and indirect hernia on the left lower quadrant.  No obvious hernias on the right side.   Component separation's done on both sides to try and get the largest midline and parastomal hernias close down with running serrated try to fix absorbable suture.  Broad underlay repair with 30 x 35 cm mesh transverse  Assessment [facility] Stay = 7 days) 7 Days Post-Op    Stable but with persistent ileus   Plan:  I think he is stable enough to go to a floor bed so he can work with therapies more aggressively.  I would appreciate if internal medicine can help follow this complicated patient with me if critical care signing off after discharge from the unit.  NG tube for persistent ileus  TPN to avoid malnutrition.  Diuresis as tolerated.  Keep on the negative side to help ileus resolved and avoid fluid overload.    No evidence of any infection by numerous cultures.  Shock resolved after transfusion and support.  Stop antibiotics after 5 days total   CT scan raise concerns of bubble near bowel and possible bowel perforation but no fluid inflammation or major  gas in the region.  Patient markedly improved and no longer in shock or pressors argues against that.  Drains in abdominal wall and peritoneum are serosanguineous and do not look like succus.  That is reassuring.  Follow closely.  If he markedly worsens then repeat CAT scan to rule out abscess  and/or diagnostic laparoscopy/abdominal exploration.  Hopefully less likely now but we will see.  CT of chest negative for any obvious pulmonary embolism or pneumonia.  Oxygen needs less.  Echocardiogram shows grade 1 diastolic dysfunction which I believe is his baseline.  Defer to critical care/internal medicine they feel further is needed aside for some monitor diuresis.  ABLA on top of anemia of chronic disease improved with transfusion.  Follow.    I think there is a desire to get him back on anticoagulation since he is normally chronically anticoagulated for history of PE.  He has past hemorrhagic shock and has not declines a reasonable restarting regroup.  Urostomy functioning with no evidence of any leak or other concern.  Perhaps will send abdominal wall drain for creatinine before pulling.  Keep in place for now.  -monitor electrolytes & replace as needed.  Keep K>4, Mg>2, Phos>3.  Hypokalemia not surprising given diuresis and high NG tube output.  Replete IV.  -Diabetes.  Sliding scale insulin .  Some mild hyperglycemia but nothing too severe.  Keep slightly elevated/on the sweet side to avoid severe hypoglycemic events.  -VTE prophylaxis- SCDs.  Anticoagulation prophyllaxis SQ as appropriate  -mobilize as tolerated to help recovery.  Enlist therapies in moderate/high risk patients as appropriate.  He seems rather tired and deconditioned and is at least a two-person assist.  Hopefully we can get him up to the chair more to help him.    I updated the patient's status to the the patient, his wife at bedside, nurse in room.  Recommendations were made.  Questions were answered.  They expressed understanding & appreciation.  -Disposition: He is going to be here a while       I reviewed nursing notes, last 24 h vitals and pain scores, last 48 h intake and output, last 24 h labs and trends, and last 24 h imaging results.  I have reviewed this patient's available data, including  medical history, events of note, test results, etc as part of my evaluation.   A significant portion of that time was spent in counseling. Care during the described time interval was provided by me.  This care required high  level of medical decision making.  04/17/2024    Subjective: (Chief complaint)  No major events in stepdown unit.  Set up and mobilized a little bit.  Wife at bedside.  Patient appreciative of care.  Apologetic for feeling frustrated the other day.  Pain less and better controlled.  Objective:  Vital signs:  Vitals:   04/17/24 0600 04/17/24 0700 04/17/24 0800 04/17/24 0827  BP: (!) 158/62 (!) 144/72 (!) 156/71   Pulse: 93 90 91   Resp: (!) 25 (!) 27 (!) 27   Temp: 98.4 F (36.9 C)   97.6 F (36.4 C)  TempSrc: Oral   Oral  SpO2: 93% 98% 95%   Weight:      Height:        Last BM Date : 04/15/24  Intake/Output   Yesterday:  06/05 0701 - 06/06 0700 In: 3351.5 [P.O.:150; I.V.:2130.3; IV Piggyback:1071.2] Out: 5125 [Urine:3750; Emesis/NG output:1200; Drains:175] This shift:  Total I/O In: 10 [I.V.:10] Out: -  Bowel function:  Flatus: YES  BM:  YES -small  Drains:  RLQ in hernia sac subcutaneous tissues of abdominal wall: serosanguineous LUQ intraperitoneal / pelvis:  serosanguineous   Physical Exam:  General: Pt awake/alert in no acute distress.  More alert and interactive.  Not tired nor sickly.   Eyes: PERRL, normal EOM.  Sclera clear.  No icterus Neuro: CN II-XII intact w/o focal sensory/motor deficits. Lymph: No head/neck/groin lymphadenopathy Psych:  No delerium/psychosis/paranoia.  Oriented x 4.  No confusion. HENT: Normocephalic, Mucus membranes moist.  No thrush.  Mumbling a little bit when he talks. Neck: Supple, No tracheal deviation.  No obvious thyromegaly Chest: No pain to chest wall compression.  Good respiratory excursion.  No audible wheezing.  Lungs clear to auscultation without wheezing, rales, nor rhonchi CV:   Pulses intact.  Regular rhythm.  No major extremity edema MS: Normal AROM mjr joints.  No obvious deformity  Abdomen: Obese but Soft.  Nondistended.  Tenderness at right flank only with significant ecchymosis stable.  Less edema but still significant on right posterior panniculus .  No crepitus or cellulitis.  Incisions clean dry intact.  No purulent drainage..  No evidence of guarding nor peritonitis.  No incarcerated hernias.   Right lower quadrant ileal conduit urostomy viable with clear lightly yellow-colored urine in bag.   Ext:   No deformity.  No mjr edema.  No cyanosis Skin: No petechiae / purpurea.  No major sores.  Warm and dry    Results:   Cultures: Recent Results (from the past 720 hours)  Culture, blood (Routine X 2) w Reflex to ID Panel     Status: None (Preliminary result)   Collection Time: 04/13/24  7:13 AM   Specimen: BLOOD  Result Value Ref Range Status   Specimen Description   Final    BLOOD BLOOD LEFT ARM AEROBIC BOTTLE ONLY ANAEROBIC BOTTLE ONLY Performed at [facility], [address] [place], [place] [address]    Special Requests   Final    BOTTLES DRAWN AEROBIC AND ANAEROBIC Blood Culture results may not be optimal due to an inadequate volume of blood received in culture bottles Performed at [facility], [address], [place], [place] [address]    Culture   Final    NO GROWTH 4 DAYS Performed at [facility], [address] [place], [place] [address]    Report Status PENDING  Incomplete  Culture, blood (Routine X 2) w Reflex to ID Panel     Status: None (Preliminary result)   Collection Time: 04/13/24  7:20 AM   Specimen: BLOOD  Result Value Ref Range Status   Specimen Description   Final    BLOOD BLOOD RIGHT ARM AEROBIC BOTTLE ONLY ANAEROBIC BOTTLE ONLY Performed at [facility], [address], [place], [place] [address]    Special Requests   Final    BOTTLES DRAWN AEROBIC AND  ANAEROBIC Blood Culture results may not be optimal due to an inadequate volume of blood received in culture bottles Performed at [facility], [address], [place], [place] [address]    Culture   Final    NO GROWTH 4 DAYS Performed at [facility], [address] [place], [place] [address]    Report Status PENDING  Incomplete  Urine Culture     Status: None   Collection Time: 04/13/24 10:18 AM   Specimen: Urine, Clean Catch  Result Value Ref Range  Status   Specimen Description   Final    URINE, CLEAN CATCH Performed at [facility], [address] [place], [place] [address]    Special Requests   Final    NONE Performed at [facility], [address] [place], [place] [address]    Culture   Final    NO GROWTH Performed at [facility], [address] [place], [place] [address]    Report Status 04/14/2024 FINAL  Final  MRSA Next Gen by PCR, Nasal     Status: None   Collection Time: 04/13/24 11:35 AM   Specimen: Nasal Mucosa; Nasal Swab  Result Value Ref Range Status   MRSA by PCR Next Gen NOT DETECTED NOT DETECTED Final    Comment: (NOTE) The GeneXpert MRSA Assay (FDA approved for NASAL specimens only), is one component of a comprehensive MRSA colonization surveillance program. It is not intended to diagnose MRSA infection nor to guide or monitor treatment for MRSA infections. Test performance is not FDA approved in patients less than 43 years old. Performed at [facility], [address], [place], [place] [address]     Labs: Results for orders placed or performed during the hospital encounter of 04/10/24 (from the past 48 hours)  Glucose, capillary     Status: Abnormal   Collection Time: 04/15/24 12:32 PM  Result Value Ref Range   Glucose-Capillary 137 (H) 70 - 99 mg/dL    Comment: Glucose reference range applies only to samples taken after fasting for at least 8 hours.    Comment 1 Notify RN    Comment 2 Document in Chart   Glucose, capillary     Status: Abnormal   Collection Time: 04/16/24 12:28 AM  Result Value Ref Range   Glucose-Capillary 149 (H) 70 - 99 mg/dL    Comment: Glucose reference range applies only to samples taken after fasting for at least 8 hours.  CBC     Status: Abnormal   Collection Time: 04/16/24  4:33 AM  Result Value Ref Range   WBC 10.1 4.0 - 10.5 K/uL   RBC 3.04 (L) 4.22 - 5.81 MIL/uL   Hemoglobin 8.8 (L) 13.0 - 17.0 g/dL   HCT 84.6 (L) 96.2 - 95.2 %   MCV 88.5 80.0 - 100.0 fL   MCH 28.9 26.0 - 34.0 pg   MCHC 32.7 30.0 - 36.0 g/dL   RDW 84.1 32.4 - 40.1 %   Platelets 152 150 - 400 K/uL   nRBC 0.0 0.0 - 0.2 %    Comment: Performed at [facility], [address] [place], [place] [address]  Comprehensive metabolic panel     Status: Abnormal   Collection Time: 04/16/24  4:33 AM  Result Value Ref Range   Sodium 143 135 - 145 mmol/L   Potassium 3.1 (L) 3.5 - 5.1 mmol/L   Chloride 112 (H) 98 - 111 mmol/L   CO2 25 22 - 32 mmol/L   Glucose, Bld 167 (H) 70 - 99 mg/dL    Comment: Glucose reference range applies only to samples taken after fasting for at least 8 hours.   BUN 57 (H) 8 - 23 mg/dL   Creatinine, Ser 3.66 0.61 - 1.24 mg/dL   Calcium  8.6 (L) 8.9 - 10.3 mg/dL   Total Protein 5.4 (L) 6.5 - 8.1 g/dL   Albumin  2.1 (L) 3.5 - 5.0 g/dL   AST 22 15 - 41 U/L   ALT 11  0 - 44 U/L   Alkaline Phosphatase 51 38 - 126 U/L   Total Bilirubin 1.3 (H) 0.0 - 1.2 mg/dL   GFR, Estimated >16 >10 mL/min    Comment: (NOTE) Calculated using the CKD-EPI Creatinine Equation (2021)    Anion gap 6 5 - 15    Comment: Performed at [facility], [address] [place], [place] [address]  Magnesium      Status: None   Collection Time: 04/16/24  4:33 AM  Result Value Ref Range   Magnesium  2.2 1.7 - 2.4 mg/dL    Comment: Performed at [facility], [address] [place], [place] [address]   Phosphorus     Status: None   Collection Time: 04/16/24  4:33 AM  Result Value Ref Range   Phosphorus 2.9 2.5 - 4.6 mg/dL    Comment: Performed at [facility], [address] [place], [place] [address]  Glucose, capillary     Status: Abnormal   Collection Time: 04/16/24  6:09 AM  Result Value Ref Range   Glucose-Capillary 159 (H) 70 - 99 mg/dL    Comment: Glucose reference range applies only to samples taken after fasting for at least 8 hours.  Glucose, capillary     Status: Abnormal   Collection Time: 04/16/24 11:29 AM  Result Value Ref Range   Glucose-Capillary 144 (H) 70 - 99 mg/dL    Comment: Glucose reference range applies only to samples taken after fasting for at least 8 hours.  Glucose, capillary     Status: Abnormal   Collection Time: 04/16/24  5:15 PM  Result Value Ref Range   Glucose-Capillary 161 (H) 70 - 99 mg/dL    Comment: Glucose reference range applies only to samples taken after fasting for at least 8 hours.  Glucose, capillary     Status: Abnormal   Collection Time: 04/16/24 11:20 PM  Result Value Ref Range   Glucose-Capillary 158 (H) 70 - 99 mg/dL    Comment: Glucose reference range applies only to samples taken after fasting for at least 8 hours.   Comment 1 Notify RN    Comment 2 Document in Chart   Basic metabolic panel with GFR     Status: Abnormal   Collection Time: 04/17/24  5:05 AM  Result Value Ref Range   Sodium 150 (H) 135 - 145 mmol/L   Potassium 3.8 3.5 - 5.1 mmol/L   Chloride 118 (H) 98 - 111 mmol/L   CO2 24 22 - 32 mmol/L   Glucose, Bld 162 (H) 70 - 99 mg/dL    Comment: Glucose reference range applies only to samples taken after fasting for at least 8 hours.   BUN 62 (H) 8 - 23 mg/dL   Creatinine, Ser 8.29 (H) 0.61 - 1.24 mg/dL   Calcium  9.0 8.9 - 10.3 mg/dL   GFR, Estimated 59 (L) >60 mL/min    Comment: (NOTE) Calculated using the CKD-EPI Creatinine Equation (2021)    Anion gap 8 5 - 15    Comment: Performed at  [facility], [address], [place], [place] [address]  Magnesium      Status: None   Collection Time: 04/17/24  5:05 AM  Result Value Ref Range   Magnesium  2.1 1.7 - 2.4 mg/dL    Comment: Performed at [facility] [address], [place], [place] [address]  Phosphorus     Status: None   Collection Time: 04/17/24  5:05 AM  Result  Value Ref Range   Phosphorus 2.7 2.5 - 4.6 mg/dL    Comment: Performed at [facility], [address] [place], [place] [address]  CBC     Status: Abnormal   Collection Time: 04/17/24  5:05 AM  Result Value Ref Range   WBC 13.7 (H) 4.0 - 10.5 K/uL   RBC 3.43 (L) 4.22 - 5.81 MIL/uL   Hemoglobin 9.8 (L) 13.0 - 17.0 g/dL   HCT 52.8 (L) 41.3 - 24.4 %   MCV 92.7 80.0 - 100.0 fL    Comment: POST TRANSFUSION SPECIMEN REPEATED TO VERIFY    MCH 28.6 26.0 - 34.0 pg   MCHC 30.8 30.0 - 36.0 g/dL   RDW 01.0 27.2 - 53.6 %   Platelets 180 150 - 400 K/uL   nRBC 0.0 0.0 - 0.2 %    Comment: Performed at [facility], [address] [place], [place] [address]  Glucose, capillary     Status: Abnormal   Collection Time: 04/17/24  6:22 AM  Result Value Ref Range   Glucose-Capillary 153 (H) 70 - 99 mg/dL    Comment: Glucose reference range applies only to samples taken after fasting for at least 8 hours.   Comment 1 Notify RN    Comment 2 Document in Chart     Imaging / Studies: CT HEAD WO CONTRAST ( ) Result Date: 04/16/2024 CLINICAL DATA:  Delirium EXAM: CT HEAD WITHOUT CONTRAST TECHNIQUE: Contiguous axial images were obtained from the base of the skull through the vertex without intravenous contrast. RADIATION DOSE REDUCTION: This exam was performed according to the departmental dose-optimization program which includes automated exposure control, adjustment of the mA and/or kV according to patient size and/or use of iterative reconstruction technique. COMPARISON:  None Available. FINDINGS:  Brain: No acute intracranial hemorrhage. No CT evidence of acute infarct. No edema, mass effect, or midline shift. The basilar cisterns are patent. Mild parenchymal volume loss. Ventricles: The ventricles are normal. Vascular: No hyperdense vessel or unexpected calcification. Skull: No acute or aggressive finding. Orbits: Orbits are symmetric. Sinuses: Mucosal thickening in the ethmoid and maxillary sinuses. No air-fluid levels. Other: Mastoid air cells are clear. Partially visualized nasoenteric tube. IMPRESSION: No CT evidence of acute intracranial abnormality. Electronically Signed   By: Denny Flack M.D.   On: 04/16/2024 08:09   DG Abd 1 View Result Date: 04/15/2024 CLINICAL DATA:  NG tube placement EXAM: ABDOMEN - 1 VIEW COMPARISON:  04/13/2024 FINDINGS: Limited field of view for tube placement verification purposes. An enteric tube is present with tip projecting over the left upper quadrant consistent with location in the body of the stomach. Surgical clips in the right upper quadrant. Visualized bowel gas pattern is normal. Linear atelectasis in the lung bases. IMPRESSION: Enteric tube tip projects over the left upper quadrant consistent with location in the body of the stomach. Electronically Signed   By: Boyce Byes M.D.   On: 04/15/2024 17:57    Medications / Allergies: per chart  Antibiotics: Anti-infectives (From admission, onward)    Start     Dose/Rate Route Frequency Ordered Stop   04/14/24 1800  ceFEPIme  (MAXIPIME ) 2 g in sodium chloride  0.9 % 100 mL IVPB        2 g 200 mL/hr over 30 Minutes Intravenous Every 8 hours 04/14/24 1003 04/19/24 1759   04/14/24 1600  vancomycin  (VANCOCIN ) IVPB 1000 mg/200 mL premix  Status:  Discontinued        1,000 mg 200 mL/hr  over 60 Minutes Intravenous Every 24 hours 04/13/24 1420 04/14/24 1000   04/14/24 1600  vancomycin  (VANCOREADY) IVPB 1500 mg/300 mL  Status:  Discontinued        1,500 mg 150 mL/hr over 120 Minutes Intravenous Every 24  hours 04/14/24 1002 04/16/24 0744   04/14/24 1200  vancomycin  (VANCOCIN ) IVPB 1000 mg/200 mL premix  Status:  Discontinued        1,000 mg 200 mL/hr over 60 Minutes Intravenous Every 24 hours 04/13/24 1053 04/13/24 1109   04/13/24 2200  ceFEPIme  (MAXIPIME ) 2 g in sodium chloride  0.9 % 100 mL IVPB  Status:  Discontinued        2 g 200 mL/hr over 30 Minutes Intravenous Every 12 hours 04/13/24 1036 04/13/24 1109   04/13/24 2200  ceFEPIme  (MAXIPIME ) 2 g in sodium chloride  0.9 % 100 mL IVPB  Status:  Discontinued        2 g 200 mL/hr over 30 Minutes Intravenous Every 12 hours 04/13/24 1425 04/14/24 1003   04/13/24 1500  vancomycin  (VANCOCIN ) 2,000 mg in sodium chloride  0.9 % 500 mL IVPB        2,000 mg 260 mL/hr over 120 Minutes Intravenous  Once 04/13/24 1409 04/13/24 1850   04/13/24 1500  ceFEPIme  (MAXIPIME ) 2 g in sodium chloride  0.9 % 100 mL IVPB  Status:  Discontinued        2 g 200 mL/hr over 30 Minutes Intravenous Every 12 hours 04/13/24 1420 04/13/24 1425   04/13/24 1500  metroNIDAZOLE  (FLAGYL ) IVPB 500 mg  Status:  Discontinued        500 mg 100 mL/hr over 60 Minutes Intravenous Every 12 hours 04/13/24 1420 04/17/24 0725   04/13/24 1200  vancomycin  (VANCOCIN ) 2,000 mg in sodium chloride  0.9 % 500 mL IVPB  Status:  Discontinued        2,000 mg 260 mL/hr over 120 Minutes Intravenous  Once 04/13/24 1052 04/13/24 1121   04/13/24 1100  metroNIDAZOLE  (FLAGYL ) IVPB 500 mg  Status:  Discontinued        500 mg 100 mL/hr over 60 Minutes Intravenous 2 times daily 04/13/24 1007 04/13/24 1109   04/13/24 1100  vancomycin  (VANCOCIN ) IVPB 1000 mg/200 mL premix  Status:  Discontinued        1,000 mg 200 mL/hr over 60 Minutes Intravenous  Once 04/13/24 1007 04/13/24 1020   04/13/24 1015  ceFEPIme  (MAXIPIME ) 2 g in sodium chloride  0.9 % 100 mL IVPB        2 g 200 mL/hr over 30 Minutes Intravenous STAT 04/13/24 1007 04/14/24 1721   04/10/24 2200  ceFAZolin  (ANCEF ) IVPB 2g/100 mL premix        2  g 200 mL/hr over 30 Minutes Intravenous Every 8 hours 04/10/24 1833 04/11/24 0531   04/10/24 0600  ceFAZolin  (ANCEF ) IVPB 2g/100 mL premix        2 g 200 mL/hr over 30 Minutes Intravenous On call to O.R. 04/10/24 0533 04/10/24 1526         Note: Portions of this report may have been transcribed using voice recognition software. Every effort was made to ensure accuracy; however, inadvertent computerized transcription errors may be present.   Any transcriptional errors that result from this process are unintentional.    Eddye Goodie, MD, FACS, MASCRS Esophageal, Gastrointestinal & Colorectal Surgery Robotic and Minimally Invasive Surgery  [facility] [address], [address] [place], [place] [address] [phone] Fax [phone]  Main  CONTACT INFORMATION: Weekday (9AM-5PM): Call [facility] main office at [phone] Weeknight (5PM-9AM) or Weekend/Holiday: Check EPIC "Web Links" tab & use "AMION" (password " [email or web address]") for General Surgery [facility] coverage  Please, DO NOT use SecureChat  (it is not reliable communication to reach operating surgeons & will lead to a delay in care).   Epic staff messaging available for outptient concerns needing 1-2 business day response.      04/17/2024  9:05 AM

## 2024-04-17 NOTE — Progress Notes (Signed)
 Nutrition Follow-up  DOCUMENTATION CODES:      INTERVENTION:  - Continue goal TPN.   - TPN management per pharmacy.   - Daily weights while on TPN.   - Will monitor for diet advancement.   NUTRITION DIAGNOSIS:   Increased nutrient needs related to acute illness as evidenced by estimated needs. *ongoing  GOAL:   Patient will meet greater than or equal to 90% of their needs *met with TPN  MONITOR:    (TPN)  REASON FOR ASSESSMENT:   Consult New TPN/TNA  ASSESSMENT:   73 y.o male patient with history of Parastomal hernia of ileal conduit. On 5/30 underwent hernia repair with urostomy revision.  5/30 Admit; s/p lysis of ahdesions, repair of incisional, parastomal, left inguinal incarcerated abdominal wall hernias, urostomy ileal conduit revision; CLD 5/31 FLD 6/1 Heart healthy diet 6/2 NPO; NGT placed for suction; TPN initiated 6/4 TPN increased to goal; NGT removed by patient; CLD then back to NPO after emesis; NGT replaced  Patient working with PT in the hallway at time of visit.  NGT remains in place and patient remains NPO for ongoing ileus. TPN remains at goal of 43mL/hr, providing 101g protein and 2047 kcals.   Admit weight: 240# Current weight: 216# I&Os: -1L *Possible weight loss since admission. However, question accuracy of weights as would not expect patient to have lost 24# in only 6 days. Will continue to monitor weights closely.  Medications reviewed and include: Dulcolax, Reglan , 100mg  thiamine (x5 days, ends tomorrow)  Labs reviewed:  Na 150 Creatinine 1.30   NUTRITION - FOCUSED PHYSICAL EXAM:  Unable to obtain at this time  Diet Order:   Diet Order             Diet NPO time specified Except for: Ice Chips  Diet effective now                   EDUCATION NEEDS:  Not appropriate for education at this time  Skin:  Skin Assessment: Skin Integrity Issues: Skin Integrity Issues:: Incisions Incisions: 5/30 abdomen  Last BM:  6/4  - type 5  Height:  Ht Readings from Last 1 Encounters:  04/10/24 5\' 11"  (1.803 m)   Weight:  Wt Readings from Last 1 Encounters:  04/16/24 98.4 kg   BMI:  Body mass index is 30.26 kg/m.  Estimated Nutritional Needs:  Kcal:  1950-2150 Protein:  95-110g Fluid:  2.1L/day    Scheryl Cushing RD, LDN Contact via Secure Chat.

## 2024-04-17 NOTE — Progress Notes (Signed)
 Physical Therapy Treatment Patient Details Name: Kristopher Thompson MRN: [id number] DOB: 10/31/1951 Today's Date: 04/17/2024   History of Present Illness Pt s/p repair of incisional, parastomal, and Left inguinal incarcerated abdominal hernias on 04/10/24 and with hx of CHF, bladder CA, prostate CA, DM, renal disorder, RBBB, and multiple abdominal surgeries    PT Comments  Pt transferred out of ICU to room 1309 AxO x 3 pleasant and following all commands.  Throat hurts "to talk" NG tube in place,  Gave Pt a pen and paper so her can write. Assisted OOB to amb.  General bed mobility comments: cues for technique, use of bed rail, assist for LEs.  Once upright seated EOB, tolerated sitting X 7 min while waiting for + 2 asisst. General transfer comment: Pt able to rise from elevated with + 2 side by side Mod/Min Assist VC's on proper hand placement on B platform EVA walker. General Gait Details: Used B platform EVA for increased support and attempt longer amb distance.  Pt required + 2 Mod Asisst while Son [place] assisted by pushing recliner behind.  Pt was able to amb 15 feet with short/shuffled steps and much effort/lean on walker.  Pt c/o increased R ear/head and neck pain with activity as well as dizziness,  Returned to room.  Vital BP 134/67, HR 87 and RA 98%.  Prior to admit, Pt was Ind/Driving living home with Spouse.  Consulted with evaluating LPT, Pt would be ideal for aggressive Rehab such as CIR.    If plan is discharge home, recommend the following: A lot of help with walking and/or transfers;A little help with bathing/dressing/bathroom;Assistance with cooking/housework;Assist for transportation;Help with stairs or ramp for entrance   Can travel by private vehicle        Equipment Recommendations       Recommendations for Other Services       Precautions / Restrictions Precautions Precautions: Knee Recall of Precautions/Restrictions: Intact Precaution/Restrictions Comments: bil JP  drains, abdominal binder, NG tube, Urostomy with bag, NPO X ice chips Restrictions Weight Bearing Restrictions Per Provider Order: No     Mobility  Bed Mobility Overal bed mobility: Needs Assistance Bed Mobility: Rolling, Sidelying to Sit Rolling: Mod assist, +2 for physical assistance Sidelying to sit: Mod assist, +2 for physical assistance, +2 for safety/equipment, Max assist       General bed mobility comments: cues for technique, use of bed rail, assist for LEs.  Once upright seated EOB, tolerated sitting X 7 min while waiting for + 2 asisst.    Transfers Overall transfer level: Needs assistance Equipment used: Bilateral platform walker Transfers: Sit to/from Stand Sit to Stand: Min assist, Mod assist, +2 physical assistance, +2 safety/equipment           General transfer comment: Pt able to rise from elevated with + 2 side by side Mod/Min Assist VC's on proper hand placement on B platform EVA walker.    Ambulation/Gait Ambulation/Gait assistance: Mod assist, +2 physical assistance, +2 safety/equipment Gait Distance (Feet): 15 Feet Assistive device: Bilateral platform walker Gait Pattern/deviations: Step-to pattern, Decreased step length - right, Decreased step length - left, Shuffle, Trunk flexed Gait velocity: decr     General Gait Details: Used B platform EVA for increased support and attempt longer amb distance.  Pt required + 2 Mod Asisst while Son [place] assisted by pushing recliner behind.  Pt was able to amb 15 feet with short/shuffled steps and much effort/lean on walker.  Pt c/o increased R ear/head and  neck pain with activity as well as dizziness,  Returned to room.  Vital BP 134/67, HR 87 and RA 98%.   Stairs             Wheelchair Mobility     Tilt Bed    Modified Rankin (Stroke Patients Only)       Balance                                            Communication Communication Communication: Impaired (Throat  irritation/pain with NG tube in place) Factors Affecting Communication: Hearing impaired  Cognition Arousal: Alert Behavior During Therapy: WFL for tasks assessed/performed   PT - Cognitive impairments: No apparent impairments                       PT - Cognition Comments: AxO x 3 pleasant and following all commands.  Throat hurts "to talk" NG tube in place,  Gave Pt a pen and paper so her can write. Following commands: Intact      Cueing Cueing Techniques: Verbal cues  Exercises      General Comments        Pertinent Vitals/Pain Pain Assessment Pain Assessment: Faces Pain Location: HA and R ear pressure Pain Descriptors / Indicators: Grimacing, Guarding, Throbbing Pain Intervention(s): Monitored during session    Home Living                          Prior Function            PT Goals (current goals can now be found in the care plan section) Progress towards PT goals: Progressing toward goals    Frequency    Min 3X/week      PT Plan      Co-evaluation              AM-PAC PT "6 Clicks" Mobility   Outcome Measure  Help needed turning from your back to your side while in a flat bed without using bedrails?: A Lot Help needed moving from lying on your back to sitting on the side of a flat bed without using bedrails?: A Lot Help needed moving to and from a bed to a chair (including a wheelchair)?: A Lot Help needed standing up from a chair using your arms (e.g., wheelchair or bedside chair)?: A Lot Help needed to walk in hospital room?: A Lot Help needed climbing 3-5 steps with a railing? : Total 6 Click Score: 11    End of Session Equipment Utilized During Treatment: Gait belt Activity Tolerance: Patient limited by fatigue Patient left: in bed;with call bell/phone within reach;with nursing/sitter in room;with family/visitor present Nurse Communication: Mobility status PT Visit Diagnosis: Difficulty in walking, not elsewhere  classified (R26.2);Pain     Time: 1610-9604 PT Time Calculation (min) (ACUTE ONLY): 48 min  Charges:    $Gait Training: 8-22 mins $Therapeutic Activity: 23-37 mins PT General Charges $$ ACUTE PT VISIT: 1 Visit                     Bess Broody  PTA Acute  Rehabilitation Services Office M-F          [phone]

## 2024-04-17 NOTE — Progress Notes (Signed)
 PHARMACY - ANTICOAGULATION CONSULT NOTE  Pharmacy Consult for IV heparin  Indication: pulmonary embolus  Allergies  Allergen Reactions   Demerol  [Meperidine ] Nausea And Vomiting and Other (See Comments)    SEVERE NAUSEA    Patient Measurements: Height: 5\' 11"  (180.3 cm) Weight: 98.4 kg (216 lb 14.9 oz) IBW/kg (Calculated) : 75.3 HEPARIN  DW (KG): 98.5  Vital Signs: Temp: 97.9 F (36.6 C) (06/06 1019) Temp Source: Oral (06/06 1019) BP: 136/72 (06/06 1019) Pulse Rate: 95 (06/06 1019)  Labs: Recent Labs    04/15/24 0444 04/16/24 0433 04/17/24 0505 04/17/24 2003  HGB 8.6* 8.8* 9.8*  --   HCT 26.3* 26.9* 31.8*  --   PLT 119* 152 180  --   HEPARINUNFRC  --   --   --  <0.10*  CREATININE 1.19 1.22 1.30*  --     Estimated Creatinine Clearance: 62.3 mL/min (A) (by C-G formula based on SCr of 1.3 mg/dL (H)).   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   moderate atypia - right low back   Atypical nevus 04/04/2007   moderate to marked - right upper back (wider shave)   Atypical nevus 04/04/2007   moderate atypia - center chest (wider shave)   Atypical nevus 04/04/2007   slight atypia - right thigh   Atypical nevus 11/29/2011   mild atypia - center upper back   Atypical nevus 11/29/2011   mild atypia - center chest   Bacteremia due to Klebsiella pneumoniae 10/09/2017   Bladder cancer (HCC) dx 07/2017   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  s/p  cystectomy w/ ileal conduit urinary diversion   Candida infection    CHF (congestive heart failure) (HCC)    Colostomy in place [facility])    since 08-08-2017-- per pt 12-25-2017 reddness around stoma   Diabetes mellitus without complication (HCC)    GERD (gastroesophageal reflux disease)    H/O hiatal hernia    History of sepsis 09/2017   dx bacteremia due to klebsiella pneumoniae,  post op intraabdominal  abscess   Prostate cancer [facility]) urologist-- dr Rozanne Corners   10-02-2012 s/p  prostatectomy-- Stage T1c   RBBB    Renal disorder    pt. denies   Sleep apnea    cpap   Squamous cell carcinoma of skin 05/22/2013   left cheek - CX3 + 5FU   Wears glasses     Medications:  Medications Prior to Admission  Medication Sig Dispense Refill Last Dose/Taking   acetaminophen  (TYLENOL ) 500 MG tablet Take 1,000 mg by mouth every 8 (eight) hours as needed for mild pain or headache.    Past Week   bisacodyl  (DULCOLAX) 5 MG EC tablet Take 2 tablets (10 mg total) by mouth daily as needed for moderate constipation. (Patient taking differently: Take 15 mg by mouth daily.) 20 tablet 0 Past Week   Cyanocobalamin  (VITAMIN B-12 PO) Place 1 tablet under the tongue 3 (three) times a week.   Past Week   D-MANNOSE PO Take 2-3 tablets by mouth See admin instructions. Take 2 tablets by mouth every morning & take 1 tablet by mouth at bedtime.   Past Week   diphenhydrAMINE  (BENADRYL ) 25 mg capsule Take 25 mg by mouth every other day as needed (congestion/allergies (every other night)).   Past Week   ELIQUIS 5 MG TABS tablet Take 5  mg by mouth 2 (two) times daily.   04/07/2024 Evening   ipratropium (ATROVENT ) 0.03 % nasal spray Place 1 spray into both nostrils 3 (three) times daily as needed (congestion).   Past Week   metFORMIN (GLUCOPHAGE) 500 MG tablet Take 500 mg by mouth in the morning.   04/09/2024   Multiple Vitamins-Minerals (CENTRUM MINIS ADULTS 50+) TABS Take 1 tablet by mouth See admin instructions. Take 1 tablet by mouth twice daily on every other day.   Past Week   Omega-3 Fatty Acids (FISH OIL PO) Take 1 capsule by mouth 3 (three) times a week.   Past Week   polyethylene glycol (MIRALAX ) 17 g packet Take 17 g by mouth daily as needed (constipation.).   Past Week   rosuvastatin  (CRESTOR ) 10 MG tablet Take 10 mg by mouth every evening.   04/09/2024   triamcinolone  cream (KENALOG ) 0.1 % Apply 1 application topically  daily as needed. 60 g 3 Past Week    Assessment: Pharmacy is consulted to dose heparin  for 73 yo male with PMH that includes PE. On apixaban PTA. Med has not been resumed as inpatient. Last dose PTA was on 5/27.   Per [place] discussion with CCM, will avoid boluses due to concerns with ABLA and transfusions during admission.   Today, 04/17/24 Heparin  level < 0.10 - undetectable on heparin  at 1700 units/hr Hgb 9.8 (improved), plt 180  Scr 1.30 mg/dl, CrCl 62 ml/min  No complications of therapy noted   Goal of Therapy:  No bolus  Heparin  level 0.3-0.7 units/ml Monitor platelets by anticoagulation protocol: Yes   Plan:  Increase heparin  infusion to 1950 units/hr  Check heparin  level 8 hours after rate change Daily CBC, HL  Monitor for signs and symptoms of bleeding   Lolita Rise, PharmD, BCPS Clinical Pharmacist 04/17/2024 8:48 PM

## 2024-04-18 LAB — CBC
HCT: 29.8 % — ABNORMAL LOW (ref 39.0–52.0)
Hemoglobin: 9.3 g/dL — ABNORMAL LOW (ref 13.0–17.0)
MCH: 28.5 pg (ref 26.0–34.0)
MCHC: 31.2 g/dL (ref 30.0–36.0)
MCV: 91.4 fL (ref 80.0–100.0)
Platelets: 238 10*3/uL (ref 150–400)
RBC: 3.26 MIL/uL — ABNORMAL LOW (ref 4.22–5.81)
RDW: 15.8 % — ABNORMAL HIGH (ref 11.5–15.5)
WBC: 14 10*3/uL — ABNORMAL HIGH (ref 4.0–10.5)
nRBC: 0 % (ref 0.0–0.2)

## 2024-04-18 LAB — BASIC METABOLIC PANEL WITH GFR
Anion gap: 5 (ref 5–15)
BUN: 60 mg/dL — ABNORMAL HIGH (ref 8–23)
CO2: 23 mmol/L (ref 22–32)
Calcium: 8.9 mg/dL (ref 8.9–10.3)
Chloride: 122 mmol/L — ABNORMAL HIGH (ref 98–111)
Creatinine, Ser: 1.12 mg/dL (ref 0.61–1.24)
GFR, Estimated: >60 mL/min (ref >60)
Glucose, Bld: 165 mg/dL — ABNORMAL HIGH (ref 70–99)
Potassium: 3.8 mmol/L (ref 3.5–5.1)
Sodium: 150 mmol/L — ABNORMAL HIGH (ref 135–145)

## 2024-04-18 LAB — CULTURE, BLOOD (ROUTINE X 2)
Culture: NO GROWTH
Culture: NO GROWTH

## 2024-04-18 LAB — GLUCOSE, CAPILLARY
Glucose-Capillary: 143 mg/dL — ABNORMAL HIGH (ref 70–99)
Glucose-Capillary: 148 mg/dL — ABNORMAL HIGH (ref 70–99)
Glucose-Capillary: 164 mg/dL — ABNORMAL HIGH (ref 70–99)
Glucose-Capillary: 94 mg/dL (ref 70–99)

## 2024-04-18 LAB — MAGNESIUM: Magnesium: 2.2 mg/dL (ref 1.7–2.4)

## 2024-04-18 LAB — HEPARIN LEVEL (UNFRACTIONATED)
Heparin Unfractionated: 0.2 [IU]/mL — ABNORMAL LOW (ref 0.30–0.70)
Heparin Unfractionated: 0.3 [IU]/mL (ref 0.30–0.70)
Heparin Unfractionated: 0.41 [IU]/mL (ref 0.30–0.70)

## 2024-04-18 LAB — PHOSPHORUS: Phosphorus: 3 mg/dL (ref 2.5–4.6)

## 2024-04-18 MED ORDER — TRAVASOL 10 % IV SOLN
INTRAVENOUS | Status: AC
  Filled 2024-04-18: qty 1015.2

## 2024-04-18 MED ORDER — POTASSIUM CHLORIDE 10 MEQ/100ML IV SOLN
10.0000 meq | INTRAVENOUS | Status: AC
  Administered 2024-04-18 (×2): 10 meq via INTRAVENOUS
  Filled 2024-04-18 (×2): qty 100

## 2024-04-18 NOTE — Progress Notes (Signed)
 PHARMACY - ANTICOAGULATION CONSULT NOTE  Pharmacy Consult for IV heparin  Indication: pulmonary embolus  Allergies  Allergen Reactions   Demerol  [Meperidine ] Nausea And Vomiting and Other (See Comments)    SEVERE NAUSEA    Patient Measurements: Height: 5\' 11"  (180.3 cm) Weight: 99.5 kg (219 lb 5.7 oz) IBW/kg (Calculated) : 75.3 HEPARIN  DW (KG): 98.5  Vital Signs: Temp: 99.1 F (37.3 C) (06/07 0623) Temp Source: Oral (06/07 0623) BP: 150/75 (06/07 0623) Pulse Rate: 87 (06/07 0623)  Labs: Recent Labs    04/16/24 0433 04/17/24 0505 04/17/24 2003 04/18/24 0542  HGB 8.8* 9.8*  --  9.3*  HCT 26.9* 31.8*  --  29.8*  PLT 152 180  --  238  HEPARINUNFRC  --   --  <0.10* 0.20*  CREATININE 1.22 1.30*  --  1.12    Estimated Creatinine Clearance: 72.7 mL/min (by C-G formula based on SCr of 1.12 mg/dL).   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   moderate atypia - right low back   Atypical nevus 04/04/2007   moderate to marked - right upper back (wider shave)   Atypical nevus 04/04/2007   moderate atypia - center chest (wider shave)   Atypical nevus 04/04/2007   slight atypia - right thigh   Atypical nevus 11/29/2011   mild atypia - center upper back   Atypical nevus 11/29/2011   mild atypia - center chest   Bacteremia due to Klebsiella pneumoniae 10/09/2017   Bladder cancer (HCC) dx 07/2017   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  s/p  cystectomy w/ ileal conduit urinary diversion   Candida infection    CHF (congestive heart failure) (HCC)    Colostomy in place [facility])    since 08-08-2017-- per pt 12-25-2017 reddness around stoma   Diabetes mellitus without complication (HCC)    GERD (gastroesophageal reflux disease)    H/O hiatal hernia    History of sepsis 09/2017   dx bacteremia due to klebsiella pneumoniae,  post op intraabdominal abscess    Prostate cancer [facility]) urologist-- dr Rozanne Corners   10-02-2012 s/p  prostatectomy-- Stage T1c   RBBB    Renal disorder    pt. denies   Sleep apnea    cpap   Squamous cell carcinoma of skin 05/22/2013   left cheek - CX3 + 5FU   Wears glasses     Medications:  Medications Prior to Admission  Medication Sig Dispense Refill Last Dose/Taking   acetaminophen  (TYLENOL ) 500 MG tablet Take 1,000 mg by mouth every 8 (eight) hours as needed for mild pain or headache.    Past Week   bisacodyl  (DULCOLAX) 5 MG EC tablet Take 2 tablets (10 mg total) by mouth daily as needed for moderate constipation. (Patient taking differently: Take 15 mg by mouth daily.) 20 tablet 0 Past Week   Cyanocobalamin  (VITAMIN B-12 PO) Place 1 tablet under the tongue 3 (three) times a week.   Past Week   D-MANNOSE PO Take 2-3 tablets by mouth See admin instructions. Take 2 tablets by mouth every morning & take 1 tablet by mouth at bedtime.   Past Week   diphenhydrAMINE  (BENADRYL ) 25 mg capsule Take 25 mg by mouth every other day as needed (congestion/allergies (every other night)).   Past Week   ELIQUIS 5 MG TABS tablet Take 5 mg by mouth 2 (  two) times daily.   04/07/2024 Evening   ipratropium (ATROVENT ) 0.03 % nasal spray Place 1 spray into both nostrils 3 (three) times daily as needed (congestion).   Past Week   metFORMIN (GLUCOPHAGE) 500 MG tablet Take 500 mg by mouth in the morning.   04/09/2024   Multiple Vitamins-Minerals (CENTRUM MINIS ADULTS 50+) TABS Take 1 tablet by mouth See admin instructions. Take 1 tablet by mouth twice daily on every other day.   Past Week   Omega-3 Fatty Acids (FISH OIL PO) Take 1 capsule by mouth 3 (three) times a week.   Past Week   polyethylene glycol (MIRALAX ) 17 g packet Take 17 g by mouth daily as needed (constipation.).   Past Week   rosuvastatin  (CRESTOR ) 10 MG tablet Take 10 mg by mouth every evening.   04/09/2024   triamcinolone  cream (KENALOG ) 0.1 % Apply 1 application topically daily as  needed. 60 g 3 Past Week    Assessment: Pharmacy is consulted to dose heparin  for 73 yo male with PMH that includes PE. On apixaban PTA. Med has not been resumed as inpatient. Last dose PTA was on 5/27.   Per [place] discussion with CCM, will avoid boluses due to concerns with ABLA and transfusions during admission.   Today, 04/18/24 Heparin  level = 0.20 - subtherapeutic on heparin  at 2150 units/hr Hgb 9.3, plt 238  Scr 1.12 mg/dl, CrCl 72 ml/min  No complications of therapy noted   Goal of Therapy:  No bolus  Heparin  level 0.3-0.7 units/ml Monitor platelets by anticoagulation protocol: Yes   Plan:  Increase heparin  infusion to 2150 units/hr  Check heparin  level 8 hours after rate change Daily CBC, HL  Monitor for signs and symptoms of bleeding   Rulon Councilman, PharmD 04/18/2024 6:24 AM

## 2024-04-18 NOTE — Progress Notes (Signed)
 PHARMACY - ANTICOAGULATION CONSULT NOTE  Pharmacy Consult for IV heparin  Indication: pulmonary embolus  Allergies  Allergen Reactions   Demerol  [Meperidine ] Nausea And Vomiting and Other (See Comments)    SEVERE NAUSEA    Patient Measurements: Height: 5\' 11"  (180.3 cm) Weight: 99.5 kg (219 lb 5.7 oz) IBW/kg (Calculated) : 75.3 HEPARIN  DW (KG): 98.5  Vital Signs: Temp: 99.1 F (37.3 C) (06/07 0623) Temp Source: Oral (06/07 0623) BP: 150/75 (06/07 0623) Pulse Rate: 87 (06/07 0623)  Labs: Recent Labs    04/16/24 0433 04/17/24 0505 04/17/24 2003 04/18/24 0542  HGB 8.8* 9.8*  --  9.3*  HCT 26.9* 31.8*  --  29.8*  PLT 152 180  --  238  HEPARINUNFRC  --   --  <0.10* 0.20*  CREATININE 1.22 1.30*  --  1.12    Estimated Creatinine Clearance: 72.7 mL/min (by C-G formula based on SCr of 1.12 mg/dL).   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   moderate atypia - right low back   Atypical nevus 04/04/2007   moderate to marked - right upper back (wider shave)   Atypical nevus 04/04/2007   moderate atypia - center chest (wider shave)   Atypical nevus 04/04/2007   slight atypia - right thigh   Atypical nevus 11/29/2011   mild atypia - center upper back   Atypical nevus 11/29/2011   mild atypia - center chest   Bacteremia due to Klebsiella pneumoniae 10/09/2017   Bladder cancer (HCC) dx 07/2017   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 denies   Sleep apnea    cpap   Squamous cell carcinoma of skin 05/22/2013   left cheek - CX3 + 5FU   Wears glasses     Medications: Apixaban PTA, LD 5/27  Assessment: Pharmacy is consulted to dose heparin  on 04/17/24 for 73 yo male with PMH that includes PE. On apixaban PTA, which has not been resumed as inpatient. Was receiving enoxaparin  for DVT ppx post-op.  Per [place] discussion with CCM, will avoid boluses due to concerns with ABLA and transfusions during admission.  Today, 04/18/24 Heparin  level = 0.30 is therapeutic on heparin  infusion of 2150 units/hr Heparin  level showing in EPIC as being drawn at 10:59, but I called lab and confirmed it was drawn at 16:17 this afternoon. Lab to correct timing in EPIC. CBC: Hgb low but stable, Plt WNL RN reports expected blood in JP drains, but no other signs of bleeding  Goal of Therapy:  No bolus  Heparin  level 0.3-0.7 units/ml Monitor platelets by anticoagulation protocol: Yes   Plan:  Continue heparin  infusion at 2150 units/hr Check confirmatory 6-8 hour heparin  level Daily CBC, HL  Monitor for signs and symptoms of  bleeding  Shireen Dory, PharmD 04/18/2024 9:39 AM

## 2024-04-18 NOTE — Progress Notes (Signed)
 8 Days Post-Op   Subjective/Chief Complaint: Alert but difficult to tell what he is saying   Objective: Vital signs in last 24 hours: Temp:  [97.9 F (36.6 C)-99.1 F (37.3 C)] 99.1 F (37.3 C) (06/07 0623) Pulse Rate:  [87-97] 87 (06/07 0623) Resp:  [19-20] 19 (06/07 0623) BP: (136-155)/(66-75) 150/75 (06/07 0623) SpO2:  [96 %-99 %] 99 % (06/07 0623) Weight:  [99.5 kg] 99.5 kg (06/07 0521) Last BM Date : 04/17/24  Intake/Output from previous day: 06/06 0701 - 06/07 0700 In: 1069.8 [P.O.:60; I.V.:910.4; IV Piggyback:99.3] Out: 2357.5 [Urine:1850; Emesis/NG output:350; Drains:157.5] Intake/Output this shift: No intake/output data recorded.  General appearance: alert and cooperative Resp: clear to auscultation bilaterally Cardio: regular rate and rhythm GI: soft, moderate tenderness. May have some cellulitis on right flank  Lab Results:  Recent Labs    04/17/24 0505 04/18/24 0542  WBC 13.7* 14.0*  HGB 9.8* 9.3*  HCT 31.8* 29.8*  PLT 180 238   BMET Recent Labs    04/17/24 0505 04/18/24 0542  NA 150* 150*  K 3.8 3.8  CL 118* 122*  CO2 24 23  GLUCOSE 162* 165*  BUN 62* 60*  CREATININE 1.30* 1.12  CALCIUM  9.0 8.9   PT/INR No results for input(s): "LABPROT", "INR" in the last 72 hours. ABG No results for input(s): "PHART", "HCO3" in the last 72 hours.  Invalid input(s): "PCO2", "PO2"  Studies/Results: No results found.  Anti-infectives: Anti-infectives (From admission, onward)    Start     Dose/Rate Route Frequency Ordered Stop   04/14/24 1800  ceFEPIme  (MAXIPIME ) 2 g in sodium chloride  0.9 % 100 mL IVPB        2 g 200 mL/hr over 30 Minutes Intravenous Every 8 hours 04/14/24 1003 04/19/24 1759   04/14/24 1600  vancomycin  (VANCOCIN ) IVPB 1000 mg/200 mL premix  Status:  Discontinued        1,000 mg 200 mL/hr over 60 Minutes Intravenous Every 24 hours 04/13/24 1420 04/14/24 1000   04/14/24 1600  vancomycin  (VANCOREADY) IVPB 1500 mg/300 mL  Status:   Discontinued        1,500 mg 150 mL/hr over 120 Minutes Intravenous Every 24 hours 04/14/24 1002 04/16/24 0744   04/14/24 1200  vancomycin  (VANCOCIN ) IVPB 1000 mg/200 mL premix  Status:  Discontinued        1,000 mg 200 mL/hr over 60 Minutes Intravenous Every 24 hours 04/13/24 1053 04/13/24 1109   04/13/24 2200  ceFEPIme  (MAXIPIME ) 2 g in sodium chloride  0.9 % 100 mL IVPB  Status:  Discontinued        2 g 200 mL/hr over 30 Minutes Intravenous Every 12 hours 04/13/24 1036 04/13/24 1109   04/13/24 2200  ceFEPIme  (MAXIPIME ) 2 g in sodium chloride  0.9 % 100 mL IVPB  Status:  Discontinued        2 g 200 mL/hr over 30 Minutes Intravenous Every 12 hours 04/13/24 1425 04/14/24 1003   04/13/24 1500  vancomycin  (VANCOCIN ) 2,000 mg in sodium chloride  0.9 % 500 mL IVPB        2,000 mg 260 mL/hr over 120 Minutes Intravenous  Once 04/13/24 1409 04/13/24 1850   04/13/24 1500  ceFEPIme  (MAXIPIME ) 2 g in sodium chloride  0.9 % 100 mL IVPB  Status:  Discontinued        2 g 200 mL/hr over 30 Minutes Intravenous Every 12 hours 04/13/24 1420 04/13/24 1425   04/13/24 1500  metroNIDAZOLE  (FLAGYL ) IVPB 500 mg  Status:  Discontinued  500 mg 100 mL/hr over 60 Minutes Intravenous Every 12 hours 04/13/24 1420 04/17/24 0725   04/13/24 1200  vancomycin  (VANCOCIN ) 2,000 mg in sodium chloride  0.9 % 500 mL IVPB  Status:  Discontinued        2,000 mg 260 mL/hr over 120 Minutes Intravenous  Once 04/13/24 1052 04/13/24 1121   04/13/24 1100  metroNIDAZOLE  (FLAGYL ) IVPB 500 mg  Status:  Discontinued        500 mg 100 mL/hr over 60 Minutes Intravenous 2 times daily 04/13/24 1007 04/13/24 1109   04/13/24 1100  vancomycin  (VANCOCIN ) IVPB 1000 mg/200 mL premix  Status:  Discontinued        1,000 mg 200 mL/hr over 60 Minutes Intravenous  Once 04/13/24 1007 04/13/24 1020   04/13/24 1015  ceFEPIme  (MAXIPIME ) 2 g in sodium chloride  0.9 % 100 mL IVPB        2 g 200 mL/hr over 30 Minutes Intravenous STAT 04/13/24 1007 04/14/24  1721   04/10/24 2200  ceFAZolin  (ANCEF ) IVPB 2g/100 mL premix        2 g 200 mL/hr over 30 Minutes Intravenous Every 8 hours 04/10/24 1833 04/11/24 0531   04/10/24 0600  ceFAZolin  (ANCEF ) IVPB 2g/100 mL premix        2 g 200 mL/hr over 30 Minutes Intravenous On call to O.R. 04/10/24 0533 04/10/24 1526       Assessment/Plan: s/p Procedure(s): REPAIR, HERNIA, PARASTOMAL AND VENTRAL HERNIAS, ROBOT-ASSISTED, LEFT INGUINAL HERNIA, LYSIS OF ADHESIONS INCARCERATED AND INSIONAL HERNIAS AND UROSTOMY REVISION (N/A) REPAIR, HERNIA, VENTRAL (N/A) Continue ng and bowel rest Continue cefepime  for cellulitis Tpn for nutrition POD 8 DM. Monitor blood sugar PT  LOS: 8 days    Lillette Reid III 04/18/2024

## 2024-04-18 NOTE — Progress Notes (Signed)
   04/18/24 2106  BiPAP/CPAP/SIPAP  BiPAP/CPAP/SIPAP Pt Type Adult  Reason BIPAP/CPAP not in use NG tube in place

## 2024-04-18 NOTE — Progress Notes (Signed)
 PHARMACY - TOTAL PARENTERAL NUTRITION CONSULT NOTE   Indication: Prolonged ileus  Patient Measurements: Height: 5\' 11"  (180.3 cm) Weight: 99.5 kg (219 lb 5.7 oz) IBW/kg (Calculated) : 75.3 TPN AdjBW (KG): 83.7 Body mass index is 30.59 kg/m.  Assessment: Pt is a 36 yoM who underwent hernia repair and urostomy revision on 04/10/24. Pt developed post-op ileus. Pharmacy consulted to manage TPN.   Glucose / Insulin : Hx DM on metformin PTA. A1c 5.3% -CBGs 94-164 in last 24hrs -10 units of insulin  used in last 24hrs Electrolytes:  -Na 150 (elevated) - CorrCa slightly elevated at 10.4  -All other lytes WNL  Renal: SCr 1.12 (stable); BUN 60 (elevated)  Hepatic: -Albumin  2.1 (low) -Tbili 1.3 (slightly elevated) -LFTs, alk phos WNL Intake / Output; MIVF:  -UOP: 1850 mL -NG/emesis: 350 mL (NG reinserted 6/4 PM) -Drain -157 mL -Stool: 1x GI Imaging: -6/3: Right lower quadrant small bowel loop with mural defect and adjacent extraluminal gas identified, which is concerning for underlying small bowel perforation. GI Surgeries / Procedures:  -5/30: Hernia repair, lysis of adhesions, urostomy revision  Central access: PICC TPN start date: 6/2   Nutritional Goals:  Goal TPN rate is 90 mL/hr (provides 101.5 g of protein and 2047 kcals per day)  RD Assessment: 6/6 Estimated Needs Total Energy Estimated Needs: 1950-2150 Total Protein Estimated Needs: 95-110g Total Fluid Estimated Needs: 2.1L/day  Current Nutrition:  TPN  NPO except ice chips  Plan: Now   IV Kcl 10mEq x2, to target K+ of >4    At 1800, continue TPN at goal 90 mL/hr Electrolytes in TPN:  Na 0 mEq/L - decreased, removed K 75 mEq/L - increased Ca 1 mEq/L, Decreased Mg 2 mEq/L   Phos 20 mmol/L Cl:Ac 1:1 Add standard MVI and trace elements to TPN Continue Moderate q6h SSI and adjust as needed  Thiamine  100 mg IV daily x 6 days through 6/7  BMP, magnesium , phosphorus with AM labs  Monitor TPN labs on Mon/Thurs,  and PRN     Elner Seifert, PharmD, BCPS 04/18/2024 7:19 AM

## 2024-04-18 NOTE — Evaluation (Signed)
 Clinical/Bedside Swallow Evaluation Patient Details  Name: Kristopher Thompson MRN: [id number] Date of Birth: Jul 29, 1951  Today's Date: 04/18/2024 Time: SLP Start Time (ACUTE ONLY): 1108 SLP Stop Time (ACUTE ONLY): 1137 SLP Time Calculation (min) (ACUTE ONLY): 29 min  Past Medical History:  Past Medical History:  Diagnosis Date   At risk for sleep apnea    12-25-2017   STOP-BANG SCORE= 5   --- SENT TO PCP   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   moderate atypia - right low back   Atypical nevus 04/04/2007   moderate to marked - right upper back (wider shave)   Atypical nevus 04/04/2007   moderate atypia - center chest (wider shave)   Atypical nevus 04/04/2007   slight atypia - right thigh   Atypical nevus 11/29/2011   mild atypia - center upper back   Atypical nevus 11/29/2011   mild atypia - center chest   Bacteremia due to Klebsiella pneumoniae 10/09/2017   Bladder cancer (HCC) dx 07/2017   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 denies   Sleep apnea    cpap   Squamous cell carcinoma of skin 05/22/2013   left cheek - CX3 + 5FU   Wears glasses    Past Surgical History:  Past Surgical History:  Procedure Laterality Date   ABDOMINAL SURGERY     APPENDECTOMY  1972   CHOLECYSTECTOMY  1985   COLONOSCOPY N/A 06/29/2021   Procedure: COLONOSCOPY;  Surgeon: Joyce Nixon, MD;  Location: [facility];  Service: Endoscopy;  Laterality: N/A;   COLOSTOMY  REVERSAL N/A 01/08/2018   Procedure: COLOSTOMY REVERSAL;  Surgeon: Joyce Nixon, MD;  Location: [facility];  Service: General;  Laterality: N/A;   CYSTOSCOPY WITH RETROGRADE PYELOGRAM, URETEROSCOPY AND STENT PLACEMENT Right 06/10/2017   Procedure: CYSTOSCOPY WITH RIGHT URETEROSCOPY WITH RIGHT STENT PLACEMENT;  Surgeon: Florencio Hunting, MD;  Location: [facility];  Service: Urology;  Laterality: Right;   EUS N/A 04/16/2018   Procedure: FULL UPPER ENDOSCOPIC ULTRASOUND (EUS) RADIAL;  Surgeon: Evangeline Hilts, MD;  Location: [facility];  Service: Endoscopy;  Laterality: N/A;   FLEXIBLE SIGMOIDOSCOPY N/A 12/13/2017   Procedure: FLEXIBLE SIGMOIDOSCOPY;  Surgeon: Joyce Nixon, MD;  Location: [facility];  Service: Endoscopy;  Laterality: N/A;   ILEO CONDUIT     IR CATHETER TUBE CHANGE  12/13/2017   IR CATHETER TUBE CHANGE  01/17/2018   IR CATHETER TUBE CHANGE  02/28/2018   IR CATHETER TUBE CHANGE  07/18/2018   IR CATHETER TUBE CHANGE  08/22/2018   IR CATHETER TUBE CHANGE  10/31/2018   IR CONVERT LEFT NEPHROSTOMY TO NEPHROURETERAL CATH  10/24/2017   IR EXT NEPHROURETERAL CATH EXCHANGE  05/19/2018   IR EXT NEPHROURETERAL CATH EXCHANGE  06/16/2018   IR NEPHRO TUBE REMOV/FL  10/24/2017   IR NEPHROSTOGRAM LEFT THRU EXISTING ACCESS  12/05/2018   IR NEPHROSTOMY PLACEMENT LEFT  10/07/2017   IR NEPHROSTOMY TUBE  CHANGE  04/11/2018   POLYPECTOMY  06/29/2021   Procedure: POLYPECTOMY;  Surgeon: Joyce Nixon, MD;  Location: [facility];  Service: Endoscopy;;   ROBOT ASSISTED LAPAROSCOPIC RADICAL PROSTATECTOMY  10/02/2012   Procedure: ROBOTIC ASSISTED LAPAROSCOPIC RADICAL PROSTATECTOMY LEVEL 2;  Surgeon: [name], MD;  Location: [facility];  Service: Urology;  Laterality: N/A;   ROBOTIC ASSISTED LAPAROSCOPIC BLADDER DIVERTICULECTOMY N/A 08/08/2017   Procedure: XI ROBOTIC ASSISTED LAPAROSCOPIC RADICAL CYSTECTOMY COVERTED TO OPEN PELVIC LYMPHADNECTOMY BILATERAL AND ILEAL CONDUIT URINARY DIVERSION;  Surgeon: Florencio Hunting, MD;   Location: [facility];  Service: Urology;  Laterality: N/A;   TRANSURETHRAL RESECTION OF BLADDER TUMOR  06/10/2017   Procedure: TRANSURETHRAL RESECTION OF BLADDER TUMOR (TURBT);  Surgeon: Florencio Hunting, MD;  Location: [facility];  Service: Urology;;   VENTRAL HERNIA REPAIR N/A 04/10/2024   Procedure: REPAIR, HERNIA, VENTRAL;  Surgeon: Candyce Champagne, MD;  Location: [facility];  Service: General;  Laterality: N/A;   XI ROBOT ABDOMINAL PERINEAL RESECTION N/A 08/08/2017   Procedure: REPAIR OF RECTAL TEAR POSSIBLE PARTIAL PROCTECTOMY, CREATION OF  OSTOMY;  Surgeon: Joyce Nixon, MD;  Location: [facility];  Service: General;  Laterality: N/A;   XI ROBOTIC ASSISTED PARASTOMAL HERNIA REPAIR N/A 04/10/2024   Procedure: REPAIR, HERNIA, PARASTOMAL AND VENTRAL HERNIAS, ROBOT-ASSISTED, LEFT INGUINAL HERNIA, LYSIS OF ADHESIONS INCARCERATED AND INSIONAL HERNIAS AND UROSTOMY REVISION;  Surgeon: Candyce Champagne, MD;  Location: [facility];  Service: General;  Laterality: N/A;   HPI:  Pt s/p repair of incisional, parastomal, and Left inguinal incarcerated abdominal hernias on 04/10/24 and with hx of CHF, bladder CA, prostate CA, DM, renal disorder, RBBB, and multiple abdominal surgeries. Made NPO on 6/2 "NG tube with repeated bilious emesis"    Assessment / Plan / Recommendation  Clinical Impression  Pt seen for skilled ST services to determine PO readiness. The pt upon arrival was sitting upright in bed with his wife Stephenie Einstein present at bedside. The pt is currently NPO with a NGT placed and was assessed with thin liquid, mildly thick liquid, puree, and regular solids. He did require intermittent verbal cues to stay alert but was generally cooperative but self-reports that "not much makes sense these days". The pt has no prior h/x of dysphagia according to the wife but has an extensive h/x of GERD related issues.  SLP administered oral care. OME showed the pt had some general weakness and slightly reduced ROM. The pt's cough was initially weak  but when prompted, was able to produce a strong cough and throat clearance. Speech slightly dysarthric which is baseline. The pt consumed thins with intermittent wet vocal quality via both spoon and straw, pt also had wet vocal quality with mildly thick liquid. He had express difficulty with self-feeding with puree, with moderate anterior loss of the bolus and a slightly wet vocal quality. When full assist for PO intake was provided, he has no anterior loss of the bolus. The pt initially denied solids, given verbal prompting consumed x1 small bite, with mild oral residue presenting. Per the wife's report and prior SLP attempts, the pt had immediate coughing with ice chips and refused PO intake- now displaying increased readiness for PO intake. Pt and pt's wife educated on the diet recs and given education about the s/s and risks of aspiration, his wife verbally acknowledged understanding. Diet recs are dys 2/thin with STRICT aspiration and GERD precautions (sit upright for all PO intake and at least 45 minutes following, small bites and sips, eat/drink slowly, take a sip of  liquid after every couple of bites, intermittent throat clearances with dry swallows) and oral care BEFORE AND AFTER every PO intake. He should NOT attempt any PO intake if not awake and alert, if he displays any s/s of aspiration, or has continued episode of emesis. SLP to f/u closely.  SLP Visit Diagnosis: Dysphagia, unspecified (R13.10)    Aspiration Risk  Mild aspiration risk    Diet Recommendation Dysphagia 2 (Fine chop);Thin liquid    Liquid Administration via: Cup Medication Administration: Crushed with puree Supervision: Full supervision/cueing for compensatory strategies;Staff to assist with self feeding Compensations: Minimize environmental distractions;Slow rate;Small sips/bites;Clear throat intermittently;Multiple dry swallows after each bite/sip;Monitor for anterior loss Postural Changes: Seated upright at 90  degrees;Remain upright for at least 30 minutes after po intake    Other  Recommendations Recommended Consults: Consider esophageal assessment Oral Care Recommendations: Oral care QID;Oral care before and after PO     Assistance Recommended at Discharge    Functional Status Assessment Patient has had a recent decline in their functional status and demonstrates the ability to make significant improvements in function in a reasonable and predictable amount of time.  Frequency and Duration min 2x/week  1 week       Prognosis Prognosis for improved oropharyngeal function: Good Barriers to Reach Goals: Cognitive deficits;Severity of deficits;Behavior      Swallow Study   General Date of Onset: 04/10/24 HPI: Pt s/p repair of incisional, parastomal, and Left inguinal incarcerated abdominal hernias on 04/10/24 and with hx of CHF, bladder CA, prostate CA, DM, renal disorder, RBBB, and multiple abdominal surgeries. Made NPO on 6/2 "NG tube with repeated bilious emesis" Type of Study: Bedside Swallow Evaluation Previous Swallow Assessment: n/a Diet Prior to this Study: NPO Temperature Spikes Noted: No Respiratory Status: Room air History of Recent Intubation: Yes (General endotracheal intubation anesthesia (GETA)) Total duration of intubation (days): 1 days Date extubated: 04/10/24 Behavior/Cognition: Lethargic/Drowsy;Requires cueing;Cooperative Oral Cavity Assessment: Excessive secretions Oral Care Completed by SLP: Yes Oral Cavity - Dentition: Adequate natural dentition Vision: Functional for self-feeding Self-Feeding Abilities: Needs assist Patient Positioning: Upright in bed;Postural control adequate for testing Baseline Vocal Quality: Low vocal intensity;Wet Volitional Cough: Other (Comment) (Pt initially had a weak cough, given increased verbal cues was able to elicit a stonger cough)    Oral/Motor/Sensory Function Overall Oral Motor/Sensory Function: Generalized oral weakness Facial  ROM: Reduced left;Reduced right Facial Symmetry: Within Functional Limits Facial Strength: Reduced right;Reduced left Facial Sensation: Within Functional Limits Lingual ROM: Within Functional Limits Lingual Symmetry: Within Functional Limits Lingual Strength: Reduced Lingual Sensation: Within Functional Limits   Ice Chips Ice chips: Within functional limits Presentation: Spoon;Self Fed   Thin Liquid Thin Liquid: Impaired Presentation: Spoon;Straw;Self Fed;Cup Pharyngeal  Phase Impairments: Wet Vocal Quality    Nectar Thick Nectar Thick Liquid: Impaired Presentation: Spoon Pharyngeal Phase Impairments: Wet Vocal Quality   Honey Thick     Puree Puree: Impaired Presentation: Self Fed;Spoon Oral Phase Impairments: Reduced labial seal Pharyngeal Phase Impairments: Wet Vocal Quality;Multiple swallows   Solid     Solid: Impaired Presentation: Self Fed Oral Phase Impairments: Reduced lingual movement/coordination Oral Phase Functional Implications: Oral residue     Eda Gone M.S. CCC-SLP

## 2024-04-19 ENCOUNTER — Inpatient Hospital Stay (HOSPITAL_COMMUNITY)

## 2024-04-19 LAB — BASIC METABOLIC PANEL WITH GFR
Anion gap: 7 (ref 5–15)
Anion gap: 6 (ref 5–15)
BUN: 50 mg/dL — ABNORMAL HIGH (ref 8–23)
BUN: 45 mg/dL — ABNORMAL HIGH (ref 8–23)
CO2: 21 mmol/L — ABNORMAL LOW (ref 22–32)
CO2: 21 mmol/L — ABNORMAL LOW (ref 22–32)
Calcium: 8.7 mg/dL — ABNORMAL LOW (ref 8.9–10.3)
Calcium: 8.6 mg/dL — ABNORMAL LOW (ref 8.9–10.3)
Chloride: 124 mmol/L — ABNORMAL HIGH (ref 98–111)
Chloride: 122 mmol/L — ABNORMAL HIGH (ref 98–111)
Creatinine, Ser: 1.21 mg/dL (ref 0.61–1.24)
Creatinine, Ser: 1.1 mg/dL (ref 0.61–1.24)
GFR, Estimated: >60 mL/min (ref >60)
GFR, Estimated: >60 mL/min (ref >60)
Glucose, Bld: 172 mg/dL — ABNORMAL HIGH (ref 70–99)
Glucose, Bld: 180 mg/dL — ABNORMAL HIGH (ref 70–99)
Potassium: 4.3 mmol/L (ref 3.5–5.1)
Potassium: 4.5 mmol/L (ref 3.5–5.1)
Sodium: 152 mmol/L — ABNORMAL HIGH (ref 135–145)
Sodium: 149 mmol/L — ABNORMAL HIGH (ref 135–145)

## 2024-04-19 LAB — CBC
HCT: 31 % — ABNORMAL LOW (ref 39.0–52.0)
HCT: 34.4 % — ABNORMAL LOW (ref 39.0–52.0)
Hemoglobin: 9.6 g/dL — ABNORMAL LOW (ref 13.0–17.0)
Hemoglobin: 10.4 g/dL — ABNORMAL LOW (ref 13.0–17.0)
MCH: 28.7 pg (ref 26.0–34.0)
MCH: 28 pg (ref 26.0–34.0)
MCHC: 31 g/dL (ref 30.0–36.0)
MCHC: 30.2 g/dL (ref 30.0–36.0)
MCV: 92.5 fL (ref 80.0–100.0)
MCV: 92.7 fL (ref 80.0–100.0)
Platelets: 297 10*3/uL (ref 150–400)
Platelets: 355 10*3/uL (ref 150–400)
RBC: 3.35 MIL/uL — ABNORMAL LOW (ref 4.22–5.81)
RBC: 3.71 MIL/uL — ABNORMAL LOW (ref 4.22–5.81)
RDW: 15.9 % — ABNORMAL HIGH (ref 11.5–15.5)
RDW: 15.9 % — ABNORMAL HIGH (ref 11.5–15.5)
WBC: 13.6 10*3/uL — ABNORMAL HIGH (ref 4.0–10.5)
WBC: 17.7 10*3/uL — ABNORMAL HIGH (ref 4.0–10.5)
nRBC: 0.1 % (ref 0.0–0.2)
nRBC: 0.2 % (ref 0.0–0.2)

## 2024-04-19 LAB — GLUCOSE, CAPILLARY
Glucose-Capillary: 135 mg/dL — ABNORMAL HIGH (ref 70–99)
Glucose-Capillary: 146 mg/dL — ABNORMAL HIGH (ref 70–99)
Glucose-Capillary: 147 mg/dL — ABNORMAL HIGH (ref 70–99)
Glucose-Capillary: 150 mg/dL — ABNORMAL HIGH (ref 70–99)

## 2024-04-19 LAB — BLOOD GAS, ARTERIAL
Acid-base deficit: 1.4 mmol/L (ref 0.0–2.0)
Bicarbonate: 20.9 mmol/L (ref 20.0–28.0)
Drawn by: 11249
FIO2: 21 %
O2 Saturation: 98.9 %
Patient temperature: 37.4
pCO2 arterial: 29 mmHg — ABNORMAL LOW (ref 32–48)
pH, Arterial: 7.47 — ABNORMAL HIGH (ref 7.35–7.45)
pO2, Arterial: 88 mmHg (ref 83–108)

## 2024-04-19 LAB — PHOSPHORUS: Phosphorus: 2.9 mg/dL (ref 2.5–4.6)

## 2024-04-19 LAB — HEPARIN LEVEL (UNFRACTIONATED): Heparin Unfractionated: 0.39 [IU]/mL (ref 0.30–0.70)

## 2024-04-19 LAB — MAGNESIUM: Magnesium: 2.2 mg/dL (ref 1.7–2.4)

## 2024-04-19 MED ORDER — FENTANYL CITRATE (PF) 250 MCG/5ML IJ SOLN
INTRAMUSCULAR | Status: AC
  Filled 2024-04-19: qty 5

## 2024-04-19 MED ORDER — PROPOFOL 10 MG/ML IV BOLUS
INTRAVENOUS | Status: AC
  Filled 2024-04-19: qty 20

## 2024-04-19 MED ORDER — IOHEXOL 9 MG/ML PO SOLN
500.0000 mL | ORAL | Status: AC
  Administered 2024-04-19 (×2): 500 mL via ORAL

## 2024-04-19 MED ORDER — TRAVASOL 10 % IV SOLN
INTRAVENOUS | Status: DC
  Filled 2024-04-19: qty 1015.2

## 2024-04-19 NOTE — Progress Notes (Signed)
 9 Days Post-Op   Subjective/Chief Complaint: Confused at times.  No complaints   Objective: Vital signs in last 24 hours: Temp:  [98 F (36.7 C)-98.4 F (36.9 C)] 98 F (36.7 C) (06/08 0620) Pulse Rate:  [88-102] 88 (06/08 0620) Resp:  [18-19] 19 (06/08 0620) BP: (147-171)/(68-82) 147/74 (06/08 0620) SpO2:  [96 %-99 %] 98 % (06/08 0620) Weight:  [99.5 kg] 99.5 kg (06/08 0500) Last BM Date : 04/17/24  Intake/Output from previous day: 06/07 0701 - 06/08 0700 In: 4458.2 [I.V.:3957.7; IV Piggyback:500.5] Out: 4030 [Urine:3150; Emesis/NG output:700; Drains:180] Intake/Output this shift: Total I/O In: 1526.4 [I.V.:1415.6; IV Piggyback:110.7] Out: 3080 [Urine:2300; Emesis/NG output:700; Drains:80]  General appearance: alert and cooperative Resp: clear to auscultation bilaterally Cardio: regular rate and rhythm GI: Soft and nontender.  Right flank cellulitis unchanged.  Drain output now looks feculent  Lab Results:  Recent Labs    04/18/24 0542 04/19/24 0508  WBC 14.0* 13.6*  HGB 9.3* 9.6*  HCT 29.8* 31.0*  PLT 238 297   BMET Recent Labs    04/18/24 0542 04/19/24 0508  NA 150* 152*  K 3.8 4.3  CL 122* 124*  CO2 23 21*  GLUCOSE 165* 172*  BUN 60* 50*  CREATININE 1.12 1.21  CALCIUM  8.9 8.7*   PT/INR No results for input(s): "LABPROT", "INR" in the last 72 hours. ABG No results for input(s): "PHART", "HCO3" in the last 72 hours.  Invalid input(s): "PCO2", "PO2"  Studies/Results: No results found.  Anti-infectives: Anti-infectives (From admission, onward)    Start     Dose/Rate Route Frequency Ordered Stop   04/14/24 1800  ceFEPIme  (MAXIPIME ) 2 g in sodium chloride  0.9 % 100 mL IVPB        2 g 200 mL/hr over 30 Minutes Intravenous Every 8 hours 04/14/24 1003 04/19/24 1759   04/14/24 1600  vancomycin  (VANCOCIN ) IVPB 1000 mg/200 mL premix  Status:  Discontinued        1,000 mg 200 mL/hr over 60 Minutes Intravenous Every 24 hours 04/13/24 1420 04/14/24  1000   04/14/24 1600  vancomycin  (VANCOREADY) IVPB 1500 mg/300 mL  Status:  Discontinued        1,500 mg 150 mL/hr over 120 Minutes Intravenous Every 24 hours 04/14/24 1002 04/16/24 0744   04/14/24 1200  vancomycin  (VANCOCIN ) IVPB 1000 mg/200 mL premix  Status:  Discontinued        1,000 mg 200 mL/hr over 60 Minutes Intravenous Every 24 hours 04/13/24 1053 04/13/24 1109   04/13/24 2200  ceFEPIme  (MAXIPIME ) 2 g in sodium chloride  0.9 % 100 mL IVPB  Status:  Discontinued        2 g 200 mL/hr over 30 Minutes Intravenous Every 12 hours 04/13/24 1036 04/13/24 1109   04/13/24 2200  ceFEPIme  (MAXIPIME ) 2 g in sodium chloride  0.9 % 100 mL IVPB  Status:  Discontinued        2 g 200 mL/hr over 30 Minutes Intravenous Every 12 hours 04/13/24 1425 04/14/24 1003   04/13/24 1500  vancomycin  (VANCOCIN ) 2,000 mg in sodium chloride  0.9 % 500 mL IVPB        2,000 mg 260 mL/hr over 120 Minutes Intravenous  Once 04/13/24 1409 04/13/24 1850   04/13/24 1500  ceFEPIme  (MAXIPIME ) 2 g in sodium chloride  0.9 % 100 mL IVPB  Status:  Discontinued        2 g 200 mL/hr over 30 Minutes Intravenous Every 12 hours 04/13/24 1420 04/13/24 1425   04/13/24 1500  metroNIDAZOLE  (FLAGYL )  IVPB 500 mg  Status:  Discontinued        500 mg 100 mL/hr over 60 Minutes Intravenous Every 12 hours 04/13/24 1420 04/17/24 0725   04/13/24 1200  vancomycin  (VANCOCIN ) 2,000 mg in sodium chloride  0.9 % 500 mL IVPB  Status:  Discontinued        2,000 mg 260 mL/hr over 120 Minutes Intravenous  Once 04/13/24 1052 04/13/24 1121   04/13/24 1100  metroNIDAZOLE  (FLAGYL ) IVPB 500 mg  Status:  Discontinued        500 mg 100 mL/hr over 60 Minutes Intravenous 2 times daily 04/13/24 1007 04/13/24 1109   04/13/24 1100  vancomycin  (VANCOCIN ) IVPB 1000 mg/200 mL premix  Status:  Discontinued        1,000 mg 200 mL/hr over 60 Minutes Intravenous  Once 04/13/24 1007 04/13/24 1020   04/13/24 1015  ceFEPIme  (MAXIPIME ) 2 g in sodium chloride  0.9 % 100 mL IVPB         2 g 200 mL/hr over 30 Minutes Intravenous STAT 04/13/24 1007 04/14/24 1721   04/10/24 2200  ceFAZolin  (ANCEF ) IVPB 2g/100 mL premix        2 g 200 mL/hr over 30 Minutes Intravenous Every 8 hours 04/10/24 1833 04/11/24 0531   04/10/24 0600  ceFAZolin  (ANCEF ) IVPB 2g/100 mL premix        2 g 200 mL/hr over 30 Minutes Intravenous On call to O.R. 04/10/24 0533 04/10/24 1526       Assessment/Plan: s/p Procedure(s): REPAIR, HERNIA, PARASTOMAL AND VENTRAL HERNIAS, ROBOT-ASSISTED, LEFT INGUINAL HERNIA, LYSIS OF ADHESIONS INCARCERATED AND INSIONAL HERNIAS AND UROSTOMY REVISION (N/A) REPAIR, HERNIA, VENTRAL (N/A) Continue NG and bowel rest Continue IV antibiotics for cellulitis.  White count improving Drain output now looks feculent although abdomen is nontender.  Will evaluate with CT scan of the abdomen and pelvis today TPN for nutrition support Postop day 9  LOS: 9 days    Lillette Reid III 04/19/2024

## 2024-04-19 NOTE — Anesthesia Preprocedure Evaluation (Addendum)
 Anesthesia Evaluation  Patient identified by MRN, date of birth, ID band Patient confused    Reviewed: Allergy & Precautions, NPO status , Patient's Chart, lab work & pertinent test results  History of Anesthesia Complications Negative for: history of anesthetic complications  Airway Mallampati: III  TM Distance: >3 FB Neck ROM: Full    Dental  (+) Dental Advisory Given   Pulmonary sleep apnea and Continuous Positive Airway Pressure Ventilation , former smoker   breath sounds clear to auscultation       Cardiovascular (-) angina +CHF   Rhythm:Regular Rate:Tachycardia  11/2023 ECHO: EF 60%, normal LVF, normal diastolic filling, mod reduced RVF, mild MR   Neuro/Psych negative neurological ROS     GI/Hepatic Neg liver ROS,GERD  Controlled,,H/o co;ostomy for intraabd abscess   Endo/Other  diabetes, Oral Hypoglycemic Agents    Renal/GU Renal InsufficiencyRenal disease   Prostate cancer, bladder cancer    Musculoskeletal   Abdominal   Peds  Hematology Lab Results      Component                Value               Date                      WBC                      17.7 (H)            04/19/2024                HGB                      10.4 (L)            04/19/2024                HCT                      34.4 (L)            04/19/2024                MCV                      92.7                04/19/2024                PLT                      355                 04/19/2024             Lab Results      Component                Value               Date                      NA                       149 (H)             04/19/2024                K  4.5                 04/19/2024                CO2                      21 (L)              04/19/2024                GLUCOSE                  180 (H)             04/19/2024                BUN                      45 (H)              04/19/2024                 CREATININE               1.10                04/19/2024                CALCIUM                   8.6 (L)             04/19/2024                GFRNONAA                 >60                 04/19/2024              Anesthesia Other Findings   Reproductive/Obstetrics                             Anesthesia Physical Anesthesia Plan  ASA: 4 and emergent  Anesthesia Plan: General   Post-op Pain Management: Ofirmev  IV (intra-op)*   Induction: Intravenous and Rapid sequence  PONV Risk Score and Plan: 2 and Dexamethasone  and Ondansetron   Airway Management Planned: Oral ETT and Mask  Additional Equipment: Arterial line  Intra-op Plan:   Post-operative Plan: Post-operative intubation/ventilation  Informed Consent:      Only emergency history available  Plan Discussed with: Surgeon  Anesthesia Plan Comments:         Anesthesia Quick Evaluation

## 2024-04-19 NOTE — Progress Notes (Signed)
 PHARMACY - ANTICOAGULATION CONSULT NOTE  Pharmacy Consult for IV heparin  Indication: pulmonary embolus  Allergies  Allergen Reactions   Demerol  [Meperidine ] Nausea And Vomiting and Other (See Comments)    SEVERE NAUSEA    Patient Measurements: Height: 5\' 11"  (180.3 cm) Weight: 99.5 kg (219 lb 5.7 oz) IBW/kg (Calculated) : 75.3 HEPARIN  DW (KG): 98.5  Vital Signs: Temp: 98 F (36.7 C) (06/08 0620) Temp Source: Oral (06/08 0620) BP: 147/74 (06/08 0620) Pulse Rate: 88 (06/08 0620)  Labs: Recent Labs    04/17/24 0505 04/17/24 2003 04/18/24 0542 04/18/24 1059 04/18/24 2251 04/19/24 0508  HGB 9.8*  --  9.3*  --   --  9.6*  HCT 31.8*  --  29.8*  --   --  31.0*  PLT 180  --  238  --   --  297  HEPARINUNFRC  --    < > 0.20* 0.30 0.41 0.39  CREATININE 1.30*  --  1.12  --   --  1.21   < > = values in this interval not displayed.    Estimated Creatinine Clearance: 67.3 mL/min (by C-G formula based on SCr of 1.21 mg/dL).   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   moderate atypia - right low back   Atypical nevus 04/04/2007   moderate to marked - right upper back (wider shave)   Atypical nevus 04/04/2007   moderate atypia - center chest (wider shave)   Atypical nevus 04/04/2007   slight atypia - right thigh   Atypical nevus 11/29/2011   mild atypia - center upper back   Atypical nevus 11/29/2011   mild atypia - center chest   Bacteremia due to Klebsiella pneumoniae 10/09/2017   Bladder cancer (HCC) dx 07/2017   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 denies   Sleep apnea    cpap   Squamous cell carcinoma of skin 05/22/2013   left cheek - CX3 + 5FU   Wears glasses     Medications: Apixaban PTA, LD 5/27  Assessment: Pharmacy is consulted to dose heparin  on 04/17/24 for 73 yo male with PMH that includes PE. On apixaban PTA, which has not been resumed as inpatient. Was receiving enoxaparin  for DVT ppx post-op.  Per [place] discussion with CCM, will avoid boluses due to concerns with ABLA and transfusions during admission.  Today, 04/19/24 Heparin  level = 0.39 remains therapeutic on heparin  infusion of 2150 units/hr CBC: Hgb low but stable, Plt WNL No complications reported  Goal of Therapy:  No bolus  Heparin  level 0.3-0.7 units/ml Monitor platelets by anticoagulation protocol: Yes   Plan:  Continue heparin  infusion at 2150 units/hr Daily CBC, HL  Monitor for signs and symptoms of bleeding  Adriana Hopping  Rip Cheese, PharmD 04/19/2024 8:34 AM

## 2024-04-19 NOTE — Plan of Care (Signed)
 ?  Problem: Clinical Measurements: ?Goal: Ability to maintain clinical measurements within normal limits will improve ?Outcome: Progressing ?Goal: Will remain free from infection ?Outcome: Progressing ?Goal: Diagnostic test results will improve ?Outcome: Progressing ?  ?

## 2024-04-19 NOTE — Progress Notes (Signed)
 Rapid Response Event Note   Reason for Call :  increased lethargy, tachycardia, cough   Initial Focused Assessment:  GCS 4, minimal response to pain, course crackles and ronchi in lower lobes. Patient is not opening eyes, answering questions, or following commands. Patient coughing with some non-purposeful movement. NG in place on suction. VSS.   Interventions:  ABG, CBC, BMP, CXR, stat paged general surgery  Plan of Care:  Patient to go to emergency surgery, then tx to ICU intubated with open abdomen.   Event Summary:  Arrived to room where patient, wife, and primary nurse were present. Neuro check done, patient noted to be minimally responsive with weak cough. Recent hernia repair, had CT done today which showed a possible leak in the abdomen. JP drainage is feculent. General surgery paged, labs and imagine ordered. General surgery arrived at the bedside to examine patient. Elvan Hamel, MD determined patient needed to go to surgery for a possible leak/perforation.   MD Notified: Aldean Hummingbird, MD Call Time: 2206 Arrival Time: 2208 End Time: 2255

## 2024-04-19 NOTE — Progress Notes (Signed)
 Patient ID: Kristopher Thompson, male   DOB: 07/19/1951, 73 y.o.   MRN: [id number]   Acute Care Surgery Service Progress Note:    Chief Complaint/Subjective: Unable to obtain Wife at [facility] Alerted by RR nurse of increased lethargy  Objective: Vital signs in last 24 hours: Temp:  [98 F (36.7 C)-99.4 F (37.4 C)] 99.3 F (37.4 C) (06/08 2239) Pulse Rate:  [88-108] 106 (06/08 2239) Resp:  [19-22] 20 (06/08 2239) BP: (120-154)/(64-91) 120/91 (06/08 2239) SpO2:  [98 %] 98 % (06/08 2239) Weight:  [99.5 kg] 99.5 kg (06/08 0500) Last BM Date : 04/17/24  Intake/Output from previous day: 06/07 0701 - 06/08 0700 In: 4458.2 [I.V.:3957.7; IV Piggyback:500.5] Out: 4030 [Urine:3150; Emesis/NG output:700; Drains:180] Intake/Output this shift: Total I/O In: 0  Out: 1180 [Urine:850; Drains:330]  Lungs: nonlabored  Cardiovascular: reg  Abd: obese, soft, large rt flank cellulitis with TTP; Rt drain - bilious - same consistency as NG   Extremities: no edema, +SCDs  Neuro: lethargic, nonfocal  Lab Results: CBC  Recent Labs    04/19/24 0508 04/19/24 2259  WBC 13.6* 17.7*  HGB 9.6* 10.4*  HCT 31.0* 34.4*  PLT 297 355   BMET Recent Labs    04/18/24 0542 04/19/24 0508  NA 150* 152*  K 3.8 4.3  CL 122* 124*  CO2 23 21*  GLUCOSE 165* 172*  BUN 60* 50*  CREATININE 1.12 1.21  CALCIUM  8.9 8.7*   LFT    Latest Ref Rng & Units 04/16/2024    4:33 AM 04/13/2024    6:35 PM 04/13/2024   10:47 AM  Hepatic Function  Total Protein 6.5 - 8.1 g/dL 5.4  5.4  5.6   Albumin  3.5 - 5.0 g/dL 2.1  2.7  2.6   AST 15 - 41 U/L 22  29  25    ALT 0 - 44 U/L 11  9  8    Alk Phosphatase 38 - 126 U/L 51  41  50   Total Bilirubin 0.0 - 1.2 mg/dL 1.3  1.6  1.0    PT/INR No results for input(s): "LABPROT", "INR" in the last 72 hours. ABG Recent Labs    04/19/24 2214  PHART 7.47*  HCO3 20.9    Studies/Results:  Anti-infectives: Anti-infectives (From admission, onward)    Start     Dose/Rate  Route Frequency Ordered Stop   04/14/24 1800  ceFEPIme  (MAXIPIME ) 2 g in sodium chloride  0.9 % 100 mL IVPB        2 g 200 mL/hr over 30 Minutes Intravenous Every 8 hours 04/14/24 1003 04/19/24 0957   04/14/24 1600  vancomycin  (VANCOCIN ) IVPB 1000 mg/200 mL premix  Status:  Discontinued        1,000 mg 200 mL/hr over 60 Minutes Intravenous Every 24 hours 04/13/24 1420 04/14/24 1000   04/14/24 1600  vancomycin  (VANCOREADY) IVPB 1500 mg/300 mL  Status:  Discontinued        1,500 mg 150 mL/hr over 120 Minutes Intravenous Every 24 hours 04/14/24 1002 04/16/24 0744   04/14/24 1200  vancomycin  (VANCOCIN ) IVPB 1000 mg/200 mL premix  Status:  Discontinued        1,000 mg 200 mL/hr over 60 Minutes Intravenous Every 24 hours 04/13/24 1053 04/13/24 1109   04/13/24 2200  ceFEPIme  (MAXIPIME ) 2 g in sodium chloride  0.9 % 100 mL IVPB  Status:  Discontinued        2 g 200 mL/hr over 30 Minutes Intravenous Every 12 hours 04/13/24 1036 04/13/24 1109  04/13/24 2200  ceFEPIme  (MAXIPIME ) 2 g in sodium chloride  0.9 % 100 mL IVPB  Status:  Discontinued        2 g 200 mL/hr over 30 Minutes Intravenous Every 12 hours 04/13/24 1425 04/14/24 1003   04/13/24 1500  vancomycin  (VANCOCIN ) 2,000 mg in sodium chloride  0.9 % 500 mL IVPB        2,000 mg 260 mL/hr over 120 Minutes Intravenous  Once 04/13/24 1409 04/13/24 1850   04/13/24 1500  ceFEPIme  (MAXIPIME ) 2 g in sodium chloride  0.9 % 100 mL IVPB  Status:  Discontinued        2 g 200 mL/hr over 30 Minutes Intravenous Every 12 hours 04/13/24 1420 04/13/24 1425   04/13/24 1500  metroNIDAZOLE  (FLAGYL ) IVPB 500 mg  Status:  Discontinued        500 mg 100 mL/hr over 60 Minutes Intravenous Every 12 hours 04/13/24 1420 04/17/24 0725   04/13/24 1200  vancomycin  (VANCOCIN ) 2,000 mg in sodium chloride  0.9 % 500 mL IVPB  Status:  Discontinued        2,000 mg 260 mL/hr over 120 Minutes Intravenous  Once 04/13/24 1052 04/13/24 1121   04/13/24 1100  metroNIDAZOLE  (FLAGYL ) IVPB  500 mg  Status:  Discontinued        500 mg 100 mL/hr over 60 Minutes Intravenous 2 times daily 04/13/24 1007 04/13/24 1109   04/13/24 1100  vancomycin  (VANCOCIN ) IVPB 1000 mg/200 mL premix  Status:  Discontinued        1,000 mg 200 mL/hr over 60 Minutes Intravenous  Once 04/13/24 1007 04/13/24 1020   04/13/24 1015  ceFEPIme  (MAXIPIME ) 2 g in sodium chloride  0.9 % 100 mL IVPB        2 g 200 mL/hr over 30 Minutes Intravenous STAT 04/13/24 1007 04/14/24 1721   04/10/24 2200  ceFAZolin  (ANCEF ) IVPB 2g/100 mL premix        2 g 200 mL/hr over 30 Minutes Intravenous Every 8 hours 04/10/24 1833 04/11/24 0531   04/10/24 0600  ceFAZolin  (ANCEF ) IVPB 2g/100 mL premix        2 g 200 mL/hr over 30 Minutes Intravenous On call to O.R. 04/10/24 0533 04/10/24 1526       Medications: Scheduled Meds:  bisacodyl   10 mg Rectal Daily   calcium  carbonate  1 tablet Oral BID WC   Chlorhexidine  Gluconate Cloth  6 each Topical Daily   insulin  aspart  0-15 Units Subcutaneous Q6H   metoprolol  tartrate  2.5 mg Intravenous Q8H   sodium chloride  flush  10-40 mL Intracatheter Q12H   sodium chloride  flush  3 mL Intravenous Q12H   Continuous Infusions:  sodium chloride      heparin  2,150 Units/hr (04/19/24 1352)   TPN ADULT (ION) 90 mL/hr at 04/19/24 1819   PRN Meds:.sodium chloride , alum & mag hydroxide-simeth, diphenhydrAMINE  **OR** [DISCONTINUED] diphenhydrAMINE , fentaNYL  (SUBLIMAZE ) injection, ipratropium, magnesium  hydroxide, menthol -cetylpyridinium, methocarbamol  (ROBAXIN ) injection, metoCLOPramide  (REGLAN ) injection, metoprolol  tartrate, naphazoline-glycerin , phenol, [DISCONTINUED] prochlorperazine  **OR** prochlorperazine , sodium chloride , sodium chloride  flush, sodium chloride  flush  Assessment/Plan: Patient Active Problem List   Diagnosis Date Noted   Sinus tachycardia 04/13/2024   Tachypnea 04/13/2024   Acute respiratory insufficiency, postoperative 04/13/2024   Sepsis due to undetermined organism  (HCC) 04/13/2024   Lactic acidosis 04/13/2024   Class 2 obesity 04/13/2024   Incarcerated incisional hernia 04/10/2024   Chronic anticoagulation 03/02/2024   Partial small bowel obstruction (HCC) 03/02/2024   Personal history of PE (pulmonary embolism) 03/02/2024   Irritant contact  dermatitis associated with urinary stoma 05/28/2023   Non-recurrent bilateral inguinal hernia without obstruction or gangrene 05/28/2023   Obstructive sleep apnea of adult 11/21/2022   Post-traumatic arthritis of ankle, 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   Hearing loss 07/24/2021   History of bladder cancer 07/24/2021   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   Prolonged QT interval 12/07/2018   AKI (acute kidney injury) (HCC) 06/15/2018   Fever 10/09/2017   Sepsis (HCC) 10/04/2017   GERD (gastroesophageal reflux disease) 08/11/2017   Obesity (BMI 35.0-39.9 without comorbidity) 08/11/2017   S/P ileal conduit (HCC) 08/08/2017   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 08/08/2017 08/08/2017   s/p Procedure(s): REPAIR, HERNIA, PARASTOMAL AND VENTRAL HERNIAS, ROBOT-ASSISTED, LEFT INGUINAL HERNIA, LYSIS OF ADHESIONS INCARCERATED AND INSIONAL HERNIAS AND UROSTOMY REVISION REPAIR, HERNIA, VENTRAL 04/10/2024 Dr [name]  DM2 H/o PE  I reviewed CT -there is a large air-fluid collection in the right lower quadrant abdominal wall.  This corresponds with the right lateral abdominal wall flank cellulitis.  The drain going through this area has enteric contents/bilious fluid in it.  It is the same consistency as the NG tube.  Given his increased white blood cell count his lethargy and this large air-fluid collection in the right lower quadrant abdominal wall that has enteric contents I am concerned that the patient has a bowel perforation  I discussed this with the patient's wife along with some of the family on the telephone.  I recommended return to the operating room this evening.  We discussed the steps of procedure which would  be gaining access to the abdomen, finding the area of probable perforation resecting it and stapling it off and doing damage control laparotomy meaning leaving his abdomen open with temporary open abdominal wound VAC.  I discussed the risk of the procedure with the wife including but not limited to bleeding, infection, injury to surrounding structures, need for additional procedures, blood clot formation, multisystem organ failure, and potential death.  We discussed that once he become stable then he could return to the operating room for reexploration, possible anastomosis or ostomy and/or closure.  I do not think that there will be any way I will be able to close his abdomen this evening given his abdominal wall was just reconstructed essentially.  I think we need to get an and get out this evening.  Discussed that he is at increased risk for enterotomies.  Discussed that he will have a prolonged hospitalization.  Discussed the chance of death.  The family members that were on the telephone asked if he could be transferred to [facility] this evening.  I told them that I did not think he was stable for transport.  Even though his vital signs are stable he is very lethargic.  I think he has impending sepsis and just needs to get damage control surgery as quickly as possible and I am concerned about the delay if we try to attempt transfer to [facility] this evening.  We discussed that certainly we could look into transferring him to [facility] afterwards but we first need to get damage control and go from there   Disposition: To operating room for exploratory laparotomy, probable bowel resection and open abdomen.  Patient will be going to the ICU intubated with an open abdomen afterwards.  Will alert ICU team.  Hold heparin .  Repeat type and cross.  Patient already on broad-spectrum IV antibiotics  LOS: 9 days    Marianna Shirk. Elvan Hamel, MD, FACS General, Bariatric, & Minimally Invasive  Surgery [phone] [facility]  Surgery, [facility]

## 2024-04-19 NOTE — Progress Notes (Signed)
 PHARMACY - TOTAL PARENTERAL NUTRITION CONSULT NOTE   Indication: Prolonged ileus  Patient Measurements: Height: 5\' 11"  (180.3 cm) Weight: 99.5 kg (219 lb 5.7 oz) IBW/kg (Calculated) : 75.3 TPN AdjBW (KG): 83.7 Body mass index is 30.59 kg/m.  Assessment: Pt is a 43 yoM who underwent hernia repair and urostomy revision on 04/10/24. Pt developed post-op ileus. Pharmacy consulted to manage TPN.   Glucose / Insulin : Hx DM on metformin PTA. A1c 5.3% -CBGs 143-148 in last 24hrs -8 units of insulin  used in last 24hrs Electrolytes:  -Na 152 (elevated, none in TPN ) - CO2 slight low at 21  - CorrCa WNL at 10.2  -All other lytes WNL  Renal: SCr 1.21 (stable); BUN 50 (elevated)  Hepatic: -Albumin  2.1 (low) -Tbili 1.3 (slightly elevated) -LFTs, alk phos WNL Intake / Output; MIVF:  -UOP: 3150 mL -NG/emesis: 700 mL (NG reinserted 6/4 PM) -Drain 180  mL -Stool: Last charted on 6/6 GI Imaging: -6/3: Right lower quadrant small bowel loop with mural defect and adjacent extraluminal gas identified, which is concerning for underlying small bowel perforation. GI Surgeries / Procedures:  -5/30: Hernia repair, lysis of adhesions, urostomy revision  Central access: PICC TPN start date: 6/2   Nutritional Goals:  Goal TPN rate is 90 mL/hr (provides 101.5 g of protein and 2047 kcals per day)  RD Assessment: 6/6 Estimated Needs Total Energy Estimated Needs: 1950-2150 Total Protein Estimated Needs: 95-110g Total Fluid Estimated Needs: 2.1L/day  Current Nutrition:  TPN  NPO except ice chips  Plan: At 1800, continue TPN at goal 90 mL/hr Electrolytes in TPN:  Na 0 mEq/L  K 75 mEq/L  Ca 1 mEq/L  Mg 2 mEq/L   Phos 20 mmol/L Cl:Ac 1:2 Add standard MVI and trace elements to TPN Continue Moderate q6h SSI and adjust as needed  Thiamine  100 mg IV daily x 6 days through 6/7   Monitor TPN labs on Mon/Thurs, and PRN     Samuele Storey, PharmD, BCPS 04/19/2024 7:18 AM

## 2024-04-19 NOTE — Progress Notes (Signed)
 PHARMACY - ANTICOAGULATION CONSULT NOTE  Pharmacy Consult for IV heparin  Indication: pulmonary embolus  Allergies  Allergen Reactions   Demerol  [Meperidine ] Nausea And Vomiting and Other (See Comments)    SEVERE NAUSEA    Patient Measurements: Height: 5\' 11"  (180.3 cm) Weight: 99.5 kg (219 lb 5.7 oz) IBW/kg (Calculated) : 75.3 HEPARIN  DW (KG): 98.5  Vital Signs: Temp: 98.4 F (36.9 C) (06/07 2111) Temp Source: Oral (06/07 2111) BP: 171/82 (06/07 2111) Pulse Rate: 102 (06/07 2111)  Labs: Recent Labs    04/16/24 0433 04/17/24 0505 04/17/24 2003 04/18/24 0542 04/18/24 1059 04/18/24 2251  HGB 8.8* 9.8*  --  9.3*  --   --   HCT 26.9* 31.8*  --  29.8*  --   --   PLT 152 180  --  238  --   --   HEPARINUNFRC  --   --    < > 0.20* 0.30 0.41  CREATININE 1.22 1.30*  --  1.12  --   --    < > = values in this interval not displayed.    Estimated Creatinine Clearance: 72.7 mL/min (by C-G formula based on SCr of 1.12 mg/dL).   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   moderate atypia - right low back   Atypical nevus 04/04/2007   moderate to marked - right upper back (wider shave)   Atypical nevus 04/04/2007   moderate atypia - center chest (wider shave)   Atypical nevus 04/04/2007   slight atypia - right thigh   Atypical nevus 11/29/2011   mild atypia - center upper back   Atypical nevus 11/29/2011   mild atypia - center chest   Bacteremia due to Klebsiella pneumoniae 10/09/2017   Bladder cancer (HCC) dx 07/2017   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 denies   Sleep apnea    cpap   Squamous cell carcinoma of skin 05/22/2013   left cheek - CX3 + 5FU   Wears glasses     Medications: Apixaban PTA, LD 5/27  Assessment: Pharmacy is consulted to dose heparin  on 04/17/24 for 73 yo male with PMH that includes PE. On apixaban PTA, which has not been resumed as inpatient. Was receiving enoxaparin  for DVT ppx post-op.  Per [place] discussion with CCM, will avoid boluses due to concerns with ABLA and transfusions during admission.  Today, 04/19/24 Repeat Heparin  level = 0.41 remains therapeutic on heparin  infusion of 2150 units/hr CBC: Hgb low but stable, Plt WNL RN reports expected blood in JP drains, but no other signs of bleeding No infusion related concerns  Goal of Therapy:  No bolus  Heparin  level 0.3-0.7 units/ml Monitor platelets by anticoagulation protocol: Yes   Plan:  Continue heparin   infusion at 2150 units/hr Daily CBC, HL  Monitor for signs and symptoms of bleeding  Arie Kurtz, PharmD 04/19/2024 12:03 AM

## 2024-04-19 NOTE — Progress Notes (Signed)
 Called Rapid Response due to change pt's level of consciousness. Patient noted very lethargic. Pt's Rt flank noted very tender,redness and warm to touch. From pt's rt jp drain draining brown fluid.  Rapid response nurse contacted to on call Dr. MD at the bed side to evaluate patient. MD explained everything about the procedure to pt's wife. Pt's wife at the bedside. Will continue to monitor.

## 2024-04-19 NOTE — Progress Notes (Signed)
Stopped Heparin drip per MD order

## 2024-04-19 NOTE — Plan of Care (Signed)
   Problem: Clinical Measurements: Goal: Ability to maintain clinical measurements within normal limits will improve Outcome: Progressing   Problem: Pain Managment: Goal: General experience of comfort will improve and/or be controlled Outcome: Progressing

## 2024-04-20 ENCOUNTER — Other Ambulatory Visit: Payer: Self-pay

## 2024-04-20 ENCOUNTER — Encounter (HOSPITAL_COMMUNITY)
Admission: RE | Disposition: A | Discharge status: Skilled Nursing Facility | Payer: Self-pay | Source: Home / Self Care | Attending: Surgery

## 2024-04-20 ENCOUNTER — Inpatient Hospital Stay (HOSPITAL_COMMUNITY)

## 2024-04-20 ENCOUNTER — Inpatient Hospital Stay (HOSPITAL_COMMUNITY): Payer: Self-pay | Admitting: Anesthesiology

## 2024-04-20 DIAGNOSIS — R6521 Severe sepsis with septic shock: Secondary | ICD-10-CM | POA: Diagnosis not present

## 2024-04-20 DIAGNOSIS — E440 Moderate protein-calorie malnutrition: Secondary | ICD-10-CM

## 2024-04-20 DIAGNOSIS — K430 Incisional hernia with obstruction, without gangrene: Secondary | ICD-10-CM | POA: Diagnosis not present

## 2024-04-20 DIAGNOSIS — E8721 Acute metabolic acidosis: Secondary | ICD-10-CM

## 2024-04-20 DIAGNOSIS — A419 Sepsis, unspecified organism: Secondary | ICD-10-CM | POA: Diagnosis not present

## 2024-04-20 HISTORY — PX: BOWEL RESECTION: SHX1257

## 2024-04-20 HISTORY — PX: VACUUM ASSISTED CLOSURE CHANGE: SHX5227

## 2024-04-20 HISTORY — PX: LAPAROTOMY: SHX154

## 2024-04-20 LAB — CBC
HCT: 30.3 % — ABNORMAL LOW (ref 39.0–52.0)
HCT: 31 % — ABNORMAL LOW (ref 39.0–52.0)
Hemoglobin: 9 g/dL — ABNORMAL LOW (ref 13.0–17.0)
Hemoglobin: 9.5 g/dL — ABNORMAL LOW (ref 13.0–17.0)
MCH: 28 pg (ref 26.0–34.0)
MCH: 29.9 pg (ref 26.0–34.0)
MCHC: 29.7 g/dL — ABNORMAL LOW (ref 30.0–36.0)
MCHC: 30.6 g/dL (ref 30.0–36.0)
MCV: 94.4 fL (ref 80.0–100.0)
MCV: 97.5 fL (ref 80.0–100.0)
Platelets: 321 10*3/uL (ref 150–400)
Platelets: 381 10*3/uL (ref 150–400)
RBC: 3.21 MIL/uL — ABNORMAL LOW (ref 4.22–5.81)
RBC: 3.18 MIL/uL — ABNORMAL LOW (ref 4.22–5.81)
RDW: 15.9 % — ABNORMAL HIGH (ref 11.5–15.5)
RDW: 16.2 % — ABNORMAL HIGH (ref 11.5–15.5)
WBC: 23.2 10*3/uL — ABNORMAL HIGH (ref 4.0–10.5)
WBC: 32.4 10*3/uL — ABNORMAL HIGH (ref 4.0–10.5)
nRBC: 0.1 % (ref 0.0–0.2)
nRBC: 0.1 % (ref 0.0–0.2)

## 2024-04-20 LAB — COOXEMETRY PANEL
Carboxyhemoglobin: 2.6 % — ABNORMAL HIGH (ref 0.5–1.5)
Methemoglobin: 2 % — ABNORMAL HIGH (ref 0.0–1.5)
O2 Saturation: 91.4 %
Total hemoglobin: 8.3 g/dL — ABNORMAL LOW (ref 12.0–16.0)

## 2024-04-20 LAB — BASIC METABOLIC PANEL WITH GFR
Anion gap: 10 (ref 5–15)
Anion gap: 8 (ref 5–15)
BUN: 51 mg/dL — ABNORMAL HIGH (ref 8–23)
BUN: 57 mg/dL — ABNORMAL HIGH (ref 8–23)
CO2: 20 mmol/L — ABNORMAL LOW (ref 22–32)
CO2: 23 mmol/L (ref 22–32)
Calcium: 7.2 mg/dL — ABNORMAL LOW (ref 8.9–10.3)
Calcium: 7 mg/dL — ABNORMAL LOW (ref 8.9–10.3)
Chloride: 112 mmol/L — ABNORMAL HIGH (ref 98–111)
Chloride: 114 mmol/L — ABNORMAL HIGH (ref 98–111)
Creatinine, Ser: 1.49 mg/dL — ABNORMAL HIGH (ref 0.61–1.24)
Creatinine, Ser: 2.03 mg/dL — ABNORMAL HIGH (ref 0.61–1.24)
GFR, Estimated: 50 mL/min — ABNORMAL LOW (ref >60)
GFR, Estimated: 34 mL/min — ABNORMAL LOW (ref >60)
Glucose, Bld: 307 mg/dL — ABNORMAL HIGH (ref 70–99)
Glucose, Bld: 197 mg/dL — ABNORMAL HIGH (ref 70–99)
Potassium: 5.6 mmol/L — ABNORMAL HIGH (ref 3.5–5.1)
Potassium: 5.6 mmol/L — ABNORMAL HIGH (ref 3.5–5.1)
Sodium: 142 mmol/L (ref 135–145)
Sodium: 145 mmol/L (ref 135–145)

## 2024-04-20 LAB — BLOOD GAS, ARTERIAL
Acid-base deficit: 7 mmol/L — ABNORMAL HIGH (ref 0.0–2.0)
Acid-base deficit: 2.9 mmol/L — ABNORMAL HIGH (ref 0.0–2.0)
Acid-base deficit: 3.1 mmol/L — ABNORMAL HIGH (ref 0.0–2.0)
Bicarbonate: 19.7 mmol/L — ABNORMAL LOW (ref 20.0–28.0)
Bicarbonate: 22.7 mmol/L (ref 20.0–28.0)
Bicarbonate: 24 mmol/L (ref 20.0–28.0)
Drawn by: 21338
FIO2: 40 %
FIO2: 40 %
FIO2: 40 %
MECHVT: 600 mL
MECHVT: 1600 mL
MECHVT: 600 mL
O2 Saturation: 100 %
O2 Saturation: 99.6 %
O2 Saturation: 100 %
PEEP: 5 cmH2O
PEEP: 8 cmH2O
PEEP: 5 cmH2O
Patient temperature: 37.4
Patient temperature: 37.7
Patient temperature: 36.9
RATE: 18 {breaths}/min
RATE: 18 {breaths}/min
RATE: 18 {breaths}/min
pCO2 arterial: 45 mmHg (ref 32–48)
pCO2 arterial: 43 mmHg (ref 32–48)
pCO2 arterial: 50 mmHg — ABNORMAL HIGH (ref 32–48)
pH, Arterial: 7.25 — ABNORMAL LOW (ref 7.35–7.45)
pH, Arterial: 7.33 — ABNORMAL LOW (ref 7.35–7.45)
pH, Arterial: 7.29 — ABNORMAL LOW (ref 7.35–7.45)
pO2, Arterial: 108 mmHg (ref 83–108)
pO2, Arterial: 115 mmHg — ABNORMAL HIGH (ref 83–108)
pO2, Arterial: 125 mmHg — ABNORMAL HIGH (ref 83–108)

## 2024-04-20 LAB — GLUCOSE, CAPILLARY
Glucose-Capillary: 152 mg/dL — ABNORMAL HIGH (ref 70–99)
Glucose-Capillary: 177 mg/dL — ABNORMAL HIGH (ref 70–99)
Glucose-Capillary: 222 mg/dL — ABNORMAL HIGH (ref 70–99)
Glucose-Capillary: 222 mg/dL — ABNORMAL HIGH (ref 70–99)
Glucose-Capillary: 251 mg/dL — ABNORMAL HIGH (ref 70–99)
Glucose-Capillary: 264 mg/dL — ABNORMAL HIGH (ref 70–99)
Glucose-Capillary: 285 mg/dL — ABNORMAL HIGH (ref 70–99)

## 2024-04-20 LAB — POCT I-STAT 7, (LYTES, BLD GAS, ICA,H+H)
Acid-base deficit: 7 mmol/L — ABNORMAL HIGH (ref 0.0–2.0)
Bicarbonate: 18.7 mmol/L — ABNORMAL LOW (ref 20.0–28.0)
Calcium, Ion: 1.22 mmol/L (ref 1.15–1.40)
HCT: 26 % — ABNORMAL LOW (ref 39.0–52.0)
Hemoglobin: 8.8 g/dL — ABNORMAL LOW (ref 13.0–17.0)
O2 Saturation: 97 %
Potassium: 5.3 mmol/L — ABNORMAL HIGH (ref 3.5–5.1)
Sodium: 151 mmol/L — ABNORMAL HIGH (ref 135–145)
TCO2: 20 mmol/L — ABNORMAL LOW (ref 22–32)
pCO2 arterial: 39 mmHg (ref 32–48)
pH, Arterial: 7.288 — ABNORMAL LOW (ref 7.35–7.45)
pO2, Arterial: 102 mmHg (ref 83–108)

## 2024-04-20 LAB — COMPREHENSIVE METABOLIC PANEL WITH GFR
ALT: 14 U/L (ref 0–44)
AST: 18 U/L (ref 15–41)
Albumin: <1.5 g/dL — ABNORMAL LOW (ref 3.5–5.0)
Alkaline Phosphatase: 29 U/L — ABNORMAL LOW (ref 38–126)
Anion gap: 12 (ref 5–15)
BUN: 35 mg/dL — ABNORMAL HIGH (ref 8–23)
CO2: 13 mmol/L — ABNORMAL LOW (ref 22–32)
Calcium: 7 mg/dL — ABNORMAL LOW (ref 8.9–10.3)
Chloride: 115 mmol/L — ABNORMAL HIGH (ref 98–111)
Creatinine, Ser: 0.84 mg/dL (ref 0.61–1.24)
GFR, Estimated: >60 mL/min (ref >60)
Glucose, Bld: 154 mg/dL — ABNORMAL HIGH (ref 70–99)
Potassium: 4.8 mmol/L (ref 3.5–5.1)
Sodium: 140 mmol/L (ref 135–145)
Total Bilirubin: 0.9 mg/dL (ref 0.0–1.2)
Total Protein: 3.4 g/dL — ABNORMAL LOW (ref 6.5–8.1)

## 2024-04-20 LAB — PHOSPHORUS: Phosphorus: 3 mg/dL (ref 2.5–4.6)

## 2024-04-20 LAB — LACTIC ACID, PLASMA
Lactic Acid, Venous: 1.6 mmol/L (ref 0.5–1.9)
Lactic Acid, Venous: 1.3 mmol/L (ref 0.5–1.9)

## 2024-04-20 LAB — MAGNESIUM: Magnesium: 1.3 mg/dL — ABNORMAL LOW (ref 1.7–2.4)

## 2024-04-20 LAB — PREALBUMIN: Prealbumin: 12 mg/dL — ABNORMAL LOW (ref 18–38)

## 2024-04-20 LAB — CORTISOL: Cortisol, Plasma: 13.6 ug/dL

## 2024-04-20 SURGERY — LAPAROTOMY, EXPLORATORY
Anesthesia: General | Site: Abdomen

## 2024-04-20 MED ORDER — VASOPRESSIN 20 UNIT/ML IV SOLN
INTRAVENOUS | Status: DC | PRN
Start: 2024-04-20 — End: 2024-04-20
  Administered 2024-04-20: 2 [IU] via INTRAVENOUS

## 2024-04-20 MED ORDER — NOREPINEPHRINE 4 MG/250ML-% IV SOLN
0.0000 ug/min | INTRAVENOUS | Status: AC
  Administered 2024-04-20: 5 ug/min via INTRAVENOUS
  Filled 2024-04-20: qty 250

## 2024-04-20 MED ORDER — FENTANYL 2500MCG IN NS 250ML (10MCG/ML) PREMIX INFUSION
0.0000 ug/h | INTRAVENOUS | Status: DC
  Administered 2024-04-20: 25 ug/h via INTRAVENOUS
  Administered 2024-04-20: 200 ug/h via INTRAVENOUS
  Administered 2024-04-21 – 2024-04-22 (×4): 300 ug/h via INTRAVENOUS
  Filled 2024-04-20 (×6): qty 250

## 2024-04-20 MED ORDER — STERILE WATER FOR INJECTION IV SOLN
INTRAVENOUS | Status: DC
  Filled 2024-04-20 (×3): qty 150
  Filled 2024-04-20: qty 1000
  Filled 2024-04-20 (×6): qty 150

## 2024-04-20 MED ORDER — PROPOFOL 10 MG/ML IV BOLUS
INTRAVENOUS | Status: DC | PRN
  Administered 2024-04-20: 100 mg via INTRAVENOUS

## 2024-04-20 MED ORDER — INSULIN ASPART 100 UNIT/ML IJ SOLN
0.0000 [IU] | INTRAMUSCULAR | Status: DC
  Administered 2024-04-20: 8 [IU] via SUBCUTANEOUS
  Administered 2024-04-20: 3 [IU] via SUBCUTANEOUS
  Administered 2024-04-20 (×2): 8 [IU] via SUBCUTANEOUS
  Administered 2024-04-20: 5 [IU] via SUBCUTANEOUS
  Administered 2024-04-20: 3 [IU] via SUBCUTANEOUS
  Administered 2024-04-21: 8 [IU] via SUBCUTANEOUS
  Administered 2024-04-21: 5 [IU] via SUBCUTANEOUS

## 2024-04-20 MED ORDER — METRONIDAZOLE 500 MG/100ML IV SOLN
500.0000 mg | INTRAVENOUS | Status: AC
  Administered 2024-04-20: 500 mg via INTRAVENOUS
  Filled 2024-04-20: qty 100

## 2024-04-20 MED ORDER — PHENYLEPHRINE HCL-NACL 20-0.9 MG/250ML-% IV SOLN
0.0000 ug/min | INTRAVENOUS | Status: DC
  Administered 2024-04-20: 100 ug/min via INTRAVENOUS
  Administered 2024-04-20: 250 ug/min via INTRAVENOUS
  Administered 2024-04-20: 100 ug/min via INTRAVENOUS
  Administered 2024-04-20: 150 ug/min via INTRAVENOUS
  Administered 2024-04-20: 180 ug/min via INTRAVENOUS
  Administered 2024-04-20: 140 ug/min via INTRAVENOUS
  Administered 2024-04-20: 160 ug/min via INTRAVENOUS
  Administered 2024-04-20: 175 ug/min via INTRAVENOUS
  Administered 2024-04-20: 240 ug/min via INTRAVENOUS
  Administered 2024-04-21: 125 ug/min via INTRAVENOUS
  Administered 2024-04-21: 115 ug/min via INTRAVENOUS
  Filled 2024-04-20: qty 250
  Filled 2024-04-20 (×2): qty 500
  Filled 2024-04-20 (×6): qty 250

## 2024-04-20 MED ORDER — PHENYLEPHRINE HCL (PRESSORS) 10 MG/ML IV SOLN
INTRAVENOUS | Status: DC | PRN
Start: 2024-04-20 — End: 2024-04-20
  Administered 2024-04-20: 80 ug via INTRAVENOUS
  Administered 2024-04-20 (×3): 160 ug via INTRAVENOUS
  Administered 2024-04-20: 240 ug via INTRAVENOUS

## 2024-04-20 MED ORDER — PHENYLEPHRINE HCL-NACL 20-0.9 MG/250ML-% IV SOLN
INTRAVENOUS | Status: DC | PRN
  Administered 2024-04-20: 50 ug/min via INTRAVENOUS

## 2024-04-20 MED ORDER — FENTANYL CITRATE (PF) 100 MCG/2ML IJ SOLN
INTRAMUSCULAR | Status: DC | PRN
  Administered 2024-04-20: 50 ug via INTRAVENOUS
  Administered 2024-04-20: 100 ug via INTRAVENOUS
  Administered 2024-04-20 (×2): 50 ug via INTRAVENOUS

## 2024-04-20 MED ORDER — ORAL CARE MOUTH RINSE: 15.0000 mL | OROMUCOSAL | Status: DC | PRN

## 2024-04-20 MED ORDER — 0.9 % SODIUM CHLORIDE (POUR BTL) OPTIME
TOPICAL | Status: DC | PRN
  Administered 2024-04-20: 7000 mL

## 2024-04-20 MED ORDER — ACETAMINOPHEN 10 MG/ML IV SOLN
INTRAVENOUS | Status: DC | PRN
  Administered 2024-04-20: 1000 mg via INTRAVENOUS

## 2024-04-20 MED ORDER — INSULIN GLARGINE-YFGN 100 UNIT/ML ~~LOC~~ SOLN
10.0000 [IU] | Freq: Once | SUBCUTANEOUS | Status: AC
  Administered 2024-04-20: 10 [IU] via SUBCUTANEOUS
  Filled 2024-04-20: qty 0.1

## 2024-04-20 MED ORDER — LACTATED RINGERS IV SOLN: INTRAVENOUS | Status: DC | PRN

## 2024-04-20 MED ORDER — MAGNESIUM SULFATE 4 GM/100ML IV SOLN
4.0000 g | Freq: Once | INTRAVENOUS | Status: AC
  Administered 2024-04-20: 4 g via INTRAVENOUS
  Filled 2024-04-20: qty 100

## 2024-04-20 MED ORDER — ALBUMIN HUMAN 25 % IV SOLN
25.0000 g | Freq: Four times a day (QID) | INTRAVENOUS | Status: AC
  Administered 2024-04-20: 25 g via INTRAVENOUS
  Administered 2024-04-20: 12.5 g via INTRAVENOUS
  Administered 2024-04-20: 25 g via INTRAVENOUS
  Filled 2024-04-20 (×3): qty 100

## 2024-04-20 MED ORDER — SODIUM CHLORIDE 0.9 % IV SOLN
2.0000 g | Freq: Three times a day (TID) | INTRAVENOUS | Status: DC
  Administered 2024-04-20: 2 g via INTRAVENOUS
  Filled 2024-04-20: qty 12.5

## 2024-04-20 MED ORDER — LIDOCAINE HCL (CARDIAC) PF 100 MG/5ML IV SOSY
PREFILLED_SYRINGE | INTRAVENOUS | Status: DC | PRN
  Administered 2024-04-20: 60 mg via INTRAVENOUS

## 2024-04-20 MED ORDER — PIPERACILLIN-TAZOBACTAM 3.375 G IVPB
3.3750 g | Freq: Three times a day (TID) | INTRAVENOUS | Status: DC
  Administered 2024-04-20 – 2024-05-01 (×29): 3.375 g via INTRAVENOUS
  Filled 2024-04-20 (×32): qty 50

## 2024-04-20 MED ORDER — HEPARIN (PORCINE) 25000 UT/250ML-% IV SOLN
2150.0000 [IU]/h | INTRAVENOUS | Status: AC
  Administered 2024-04-20 – 2024-04-21 (×2): 2150 [IU]/h via INTRAVENOUS
  Filled 2024-04-20 (×2): qty 250

## 2024-04-20 MED ORDER — METRONIDAZOLE 500 MG/100ML IV SOLN: 500.0000 mg | Freq: Two times a day (BID) | INTRAVENOUS | Status: DC

## 2024-04-20 MED ORDER — SUCCINYLCHOLINE CHLORIDE 200 MG/10ML IV SOSY
PREFILLED_SYRINGE | INTRAVENOUS | Status: DC | PRN
  Administered 2024-04-20: 140 mg via INTRAVENOUS

## 2024-04-20 MED ORDER — NOREPINEPHRINE 4 MG/250ML-% IV SOLN
0.0000 ug/min | INTRAVENOUS | Status: DC
  Administered 2024-04-20: 30 ug/min via INTRAVENOUS
  Administered 2024-04-20 (×2): 40 ug/min via INTRAVENOUS
  Filled 2024-04-20 (×3): qty 250

## 2024-04-20 MED ORDER — NOREPINEPHRINE 16 MG/250ML-% IV SOLN
0.0000 ug/min | INTRAVENOUS | Status: DC
  Administered 2024-04-20: 32 ug/min via INTRAVENOUS
  Administered 2024-04-20: 10 ug/min via INTRAVENOUS
  Administered 2024-04-21: 16 ug/min via INTRAVENOUS
  Administered 2024-04-21: 21 ug/min via INTRAVENOUS
  Administered 2024-04-22: 11 ug/min via INTRAVENOUS
  Filled 2024-04-20 (×5): qty 250

## 2024-04-20 MED ORDER — VASOPRESSIN 20 UNITS/100 ML INFUSION FOR SHOCK
0.0000 [IU]/min | INTRAVENOUS | Status: DC
  Administered 2024-04-20 (×2): 0.03 [IU]/min via INTRAVENOUS
  Administered 2024-04-21: 0.04 [IU]/min via INTRAVENOUS
  Administered 2024-04-21: 0.03 [IU]/min via INTRAVENOUS
  Administered 2024-04-22: 0.04 [IU]/min via INTRAVENOUS
  Filled 2024-04-20 (×6): qty 100

## 2024-04-20 MED ORDER — ACETAMINOPHEN 10 MG/ML IV SOLN
1000.0000 mg | Freq: Three times a day (TID) | INTRAVENOUS | Status: AC | PRN
  Administered 2024-04-20: 1000 mg via INTRAVENOUS
  Filled 2024-04-20: qty 100

## 2024-04-20 MED ORDER — ACETAMINOPHEN 10 MG/ML IV SOLN
INTRAVENOUS | Status: AC
  Filled 2024-04-20: qty 100

## 2024-04-20 MED ORDER — FENTANYL CITRATE PF 50 MCG/ML IJ SOSY: 25.0000 ug | PREFILLED_SYRINGE | Freq: Once | INTRAMUSCULAR | Status: DC

## 2024-04-20 MED ORDER — VASOPRESSIN 20 UNIT/ML IV SOLN
INTRAVENOUS | Status: AC
  Filled 2024-04-20: qty 1

## 2024-04-20 MED ORDER — TRAVASOL 10 % IV SOLN
INTRAVENOUS | Status: AC
  Filled 2024-04-20: qty 1015.2

## 2024-04-20 MED ORDER — POLYETHYLENE GLYCOL 3350 17 G PO PACK
17.0000 g | PACK | Freq: Every day | ORAL | Status: DC
  Administered 2024-04-23 – 2024-04-26 (×4): 17 g
  Filled 2024-04-20 (×6): qty 1

## 2024-04-20 MED ORDER — FAMOTIDINE IN NACL 20-0.9 MG/50ML-% IV SOLN
20.0000 mg | Freq: Two times a day (BID) | INTRAVENOUS | Status: AC
  Administered 2024-04-20 – 2024-04-22 (×6): 20 mg via INTRAVENOUS
  Filled 2024-04-20 (×6): qty 50

## 2024-04-20 MED ORDER — SODIUM CHLORIDE 0.9 % IV SOLN
150.0000 mg | INTRAVENOUS | Status: DC
  Administered 2024-04-20 – 2024-04-27 (×8): 150 mg via INTRAVENOUS
  Filled 2024-04-20 (×8): qty 7.5

## 2024-04-20 MED ORDER — ROCURONIUM BROMIDE 100 MG/10ML IV SOLN
INTRAVENOUS | Status: DC | PRN
  Administered 2024-04-20: 40 mg via INTRAVENOUS
  Administered 2024-04-20: 60 mg via INTRAVENOUS
  Administered 2024-04-20: 50 mg via INTRAVENOUS

## 2024-04-20 MED ORDER — ALBUMIN HUMAN 5 % IV SOLN: INTRAVENOUS | Status: DC | PRN

## 2024-04-20 MED ORDER — ORAL CARE MOUTH RINSE
15.0000 mL | OROMUCOSAL | Status: DC
Start: 2024-04-20 — End: 2024-04-20

## 2024-04-20 MED ORDER — SODIUM CHLORIDE 0.9 % IV BOLUS
1000.0000 mL | Freq: Once | INTRAVENOUS | Status: AC
  Administered 2024-04-20: 1000 mL via INTRAVENOUS

## 2024-04-20 MED ORDER — DOCUSATE SODIUM 50 MG/5ML PO LIQD
100.0000 mg | Freq: Two times a day (BID) | ORAL | Status: DC
  Filled 2024-04-20 (×3): qty 10

## 2024-04-20 MED ORDER — PROPOFOL 500 MG/50ML IV EMUL
INTRAVENOUS | Status: DC | PRN
  Administered 2024-04-20: 30 ug/kg/min via INTRAVENOUS

## 2024-04-20 MED ORDER — PROPOFOL 1000 MG/100ML IV EMUL
0.0000 ug/kg/min | INTRAVENOUS | Status: DC
  Administered 2024-04-20 (×3): 40 ug/kg/min via INTRAVENOUS
  Administered 2024-04-20: 30 ug/kg/min via INTRAVENOUS
  Administered 2024-04-20 – 2024-04-22 (×9): 40 ug/kg/min via INTRAVENOUS
  Filled 2024-04-20: qty 200
  Filled 2024-04-20 (×11): qty 100

## 2024-04-20 MED ORDER — SODIUM BICARBONATE 8.4 % IV SOLN
150.0000 meq | Freq: Once | INTRAVENOUS | Status: AC
  Administered 2024-04-20: 150 meq via INTRAVENOUS
  Filled 2024-04-20: qty 150

## 2024-04-20 MED ORDER — CHLORHEXIDINE GLUCONATE CLOTH 2 % EX PADS
6.0000 | MEDICATED_PAD | Freq: Every day | CUTANEOUS | Status: DC
  Administered 2024-04-20 – 2024-04-29 (×9): 6 via TOPICAL

## 2024-04-20 SURGICAL SUPPLY — 56 items
APPLICATOR COTTON TIP 6 STRL (MISCELLANEOUS) IMPLANT
APPLICATOR COTTON TIP 6IN STRL (MISCELLANEOUS) ×2 IMPLANT
BAG COUNTER SPONGE SURGICOUNT (BAG) IMPLANT
BAG URINE DRAIN 2000ML AR STRL (UROLOGICAL SUPPLIES) IMPLANT
BARRIER SKIN 2 1/4 (WOUND CARE) ×1 IMPLANT
BARRIER SKIN OD1.75 2 1/4 FLNG (WOUND CARE) IMPLANT
BLADE EXTENDED COATED 6.5IN (ELECTRODE) ×1 IMPLANT
CANISTER WOUND CARE 500ML ATS (WOUND CARE) IMPLANT
CELLS DAT CNTRL 66122 CELL SVR (MISCELLANEOUS) IMPLANT
CLIP TI LARGE 6 (CLIP) IMPLANT
COVER SURGICAL LIGHT HANDLE (MISCELLANEOUS) ×1 IMPLANT
DRAIN CHANNEL 19F RND (DRAIN) IMPLANT
DRAPE LAPAROSCOPIC ABDOMINAL (DRAPES) IMPLANT
DRAPE SURG 17X23 STRL (DRAPES) IMPLANT
DRSG OPSITE POSTOP 4X6 (GAUZE/BANDAGES/DRESSINGS) IMPLANT
DRSG OPSITE POSTOP 4X8 (GAUZE/BANDAGES/DRESSINGS) IMPLANT
ELECT REM PT RETURN 15FT ADLT (MISCELLANEOUS) ×1 IMPLANT
EVACUATOR SILICONE 100CC (DRAIN) ×1 IMPLANT
GAUZE SPONGE 4X4 12PLY STRL (GAUZE/BANDAGES/DRESSINGS) ×1 IMPLANT
GLOVE BIO SURGEON STRL SZ 6 (GLOVE) IMPLANT
GLOVE BIO SURGEON STRL SZ7.5 (GLOVE) ×1 IMPLANT
GLOVE BIO SURGEON STRL SZ8 (GLOVE) IMPLANT
GLOVE BIOGEL PI IND STRL 7.5 (GLOVE) IMPLANT
GLOVE BIOGEL PI IND STRL 8 (GLOVE) IMPLANT
GLOVE INDICATOR 6.5 STRL GRN (GLOVE) IMPLANT
GLOVE INDICATOR 8.0 STRL GRN (GLOVE) ×1 IMPLANT
GOWN STRL REUS W/ TWL XL LVL3 (GOWN DISPOSABLE) ×1 IMPLANT
GOWN STRL REUS W/TWL XL LVL3 (GOWN DISPOSABLE) IMPLANT
KIT TURNOVER KIT A (KITS) IMPLANT
LEGGING LITHOTOMY PAIR STRL (DRAPES) IMPLANT
LIGASURE IMPACT 36 18CM CVD LR (INSTRUMENTS) IMPLANT
NS IRRIG 1000ML POUR BTL (IV SOLUTION) ×2 IMPLANT
PACK GENERAL/GYN (CUSTOM PROCEDURE TRAY) ×1 IMPLANT
PENCIL SMOKE EVACUATOR (MISCELLANEOUS) IMPLANT
RELOAD PROXIMATE 75MM BLUE (ENDOMECHANICALS) ×1 IMPLANT
RELOAD STAPLE 75 3.8 BLU REG (ENDOMECHANICALS) IMPLANT
RETRACTOR WND ALEXIS 18 MED (MISCELLANEOUS) IMPLANT
SHEARS FOC LG CVD HARMONIC 17C (MISCELLANEOUS) IMPLANT
SHEARS HARMONIC 36 ACE (MISCELLANEOUS) IMPLANT
SOL PREP POV-IOD 4OZ 10% (MISCELLANEOUS) IMPLANT
SPONGE ABD ABTHERA ADVANCE (MISCELLANEOUS) IMPLANT
SPONGE T-LAP 18X18 ~~LOC~~+RFID (SPONGE) IMPLANT
STAPLER PROXIMATE 75MM BLUE (STAPLE) IMPLANT
STAPLER SKIN PROX 35W (STAPLE) ×1 IMPLANT
SUT PDS AB 1 TP1 96 (SUTURE) IMPLANT
SUT PDS AB 3-0 SH 27 (SUTURE) ×1 IMPLANT
SUT PDS AB 4-0 SH 27 (SUTURE) IMPLANT
SUT PROLENE 2 0 BLUE (SUTURE) IMPLANT
SUT SILK 2 0 SH CR/8 (SUTURE) ×2 IMPLANT
SUT SILK 2 0SH CR/8 30 (SUTURE) IMPLANT
SUT SILK 2-0 18XBRD TIE 12 (SUTURE) ×2 IMPLANT
SUT SILK 2-0 30XBRD TIE 12 (SUTURE) IMPLANT
SUT SILK 3 0 SH CR/8 (SUTURE) ×2 IMPLANT
SUT SILK 3-0 18XBRD TIE 12 (SUTURE) ×2 IMPLANT
TOWEL OR 17X26 10 PK STRL BLUE (TOWEL DISPOSABLE) ×1 IMPLANT
TRAY FOLEY MTR SLVR 16FR STAT (SET/KITS/TRAYS/PACK) ×1 IMPLANT

## 2024-04-20 NOTE — Transfer of Care (Signed)
 Immediate Anesthesia Transfer of Care Note  Patient: Kristopher Thompson  Procedure(s) Performed: LAPAROTOMY, EXPLORATORY  Patient Location: PACU and SICU  Anesthesia Type:General  Level of Consciousness: Patient remains intubated per anesthesia plan  Airway & Oxygen Therapy: Patient remains intubated per anesthesia plan  Post-op Assessment: Report given to RN and Post -op Vital signs reviewed and stable  Post vital signs: Reviewed  Last Vitals:  Vitals Value Taken Time  BP    Temp    Pulse 109 04/20/24 0230  Resp 18 04/20/24 0230  SpO2    Vitals shown include unfiled device data.  Last Pain:  Vitals:   04/19/24 2239  TempSrc: Oral  PainSc:       Patients Stated Pain Goal: 2 (04/10/24 2037)  Complications: No notable events documented.

## 2024-04-20 NOTE — Op Note (Addendum)
 04/20/2024  2:07 AM  PATIENT:  Kristopher Thompson  73 y.o. male  PRE-OPERATIVE DIAGNOSIS:  perforated bowel; history of robotic lysis of adhesions x 4 hours, component separation-transversus abdominis release bilateral, robotic repairs of incisional, parastomal, left inguinal incarcerated abdominal wall hernias with mesh; urostomy ileal conduit revision by Dr. [name] on May 30  POST-OPERATIVE DIAGNOSIS: Same  PROCEDURE:  Procedure(s): LAPAROTOMY, EXPLORATORY Drainage of right abdominal wall abscess as well as intra-abdominal abscess Explantation of abdominal mesh Small bowel resection (in discontinuity) Placement of ABThera wound VAC  SURGEON:  Surgeon(s): Aldean Hummingbird, MD   ASSISTANTS: Adalberto Acton, MD   ANESTHESIA:   general  FINDINGS: The patient had extensive enteric staining of his right lateral abdominal wall and flank.  There was enteric contents/small bowel contents within the right lateral abdominal wall soft tissue consistent with abscess which was drained intra-abdominal.  There was already significant intra-abdominal adhesions.  The mesh was stained with enteric contents and the mesh was removed en bloc.  It appeared that the distal ileum around its old anastomosis had a perforation/leak.  This area appeared ischemic.  There also appeared to be significant thinning of about half a centimeter of the section of distal small bowel.  It was hard to tell if this was the area where the prior small bowel anastomosis had been performed when his ileal conduit had been created in 2018.  The ileal conduit going up through the right lateral abdominal wall was identified.  There is a small bridge of bridging muscle overlying it.  The ileal conduit appeared marginally viable but patent.  1 area appeared thin-walled so the ileal conduit will need to be inspected on takeback.  We decided to resect that small section of small bowel that had defect in the wall.  It was unclear if there was  additional areas of leak.  We left this section of small bowel in discontinuity.  3-0 silk sutures were placed on each of the ends for future identification.  DRAINS: (19 fr) Jackson-Pratt drain(s) with closed bulb suction in the Rt lateral abd; Left 31fr JP drain L abdomen   LOCAL MEDICATIONS USED:  NONE  SPECIMEN:  Source of Specimen:  section of small bowel  DISPOSITION OF SPECIMEN:  PATHOLOGY  COUNTS:  YES  INDICATION FOR PROCEDURE: 73 year old gentleman with remote history of prostate cancer who then subsequently developed bladder cancer who underwent ileal conduit, cystectomy, anterior wall of rectum repair with a diverting loop colostomy around 2018.  His loop colostomy was then reversed.  The patient had chronic multiple hernias.  He underwent elective repair on May 30.  His postoperative course has been protracted.  Today on rounds the drain output on the right side had changed in nature.  It was concerning for enteric contents.  The patient had been having some right lateral abdominal wall flank cellulitis.  CT scan today revealed a large air-fluid collection in the right lateral abdominal wall flank area.  Patient had worsening lethargy.  He had a small bump in his leukocytosis today.  Given the air-fluid collection in the right lateral abdominal wall and the enteric contents I was very concerned the patient had developed a perforation.  I recommended to the wife that he be brought back to the operating room for exploratory surgery.  We discussed that this would likely involve resection of a section of the bowel and that we would likely have to leave the abdomen in discontinuity and open and bring back for reexploration  at a later date.  We discussed risk of the surgery including but not limited to bleeding, infection, need for additional procedures injury to surrounding structures, incisional hernia formation, blood clot formation, perioperative cardiac and pulmonary events, ileus,  multisystem organ failure, and potential death.  PROCEDURE: After obtaining informed consent from the patient's wife he was brought to the operating room at [facility] and placed supine on the operating room table.  General endotracheal anesthesia was established.  Sequential compression devices were already placed.  His surgical dressings were removed including the urostomy appliance back.  Anesthesia placed an arterial line.  His abdomen was prepped and draped in the usual standard surgical fashion.  The urostomy itself at the skin level had some areas of patchy viability.  Surgical timeout was performed.  The patient was on scheduled therapeutic antibiotics.  His old midline incision was incised with a 10 blade.  Subcutaneous tissue was divided with electrocautery.  We came across sort of seroma cavity infraumbilical.  The fascia was encountered and we incised it with electrocautery.  The mesh was visualized and the mesh was stained with enteric contents.  There was drainage of enteric contents from the right lateral abdominal wall in the subcutaneous space which was connected to the section of the right lateral abdominal wall for the urostomy was coming out through.  We drained this subcutaneous abscess.  We ended up removing the entire sheet of mesh.  I was essentially able to just pull it out.  It had been secured to the abdominal wall with secure strap tacks.  The mesh was removed in its entirety.  We were then able to visualize the intra-abdominal contents.  There is enteric staining of the bowel.  The bowel already had significant interloop adhesions and was already mostly cocooned.  There was significant enteric staining along the right lateral abdominal wall.  There was an area of small bowel that had significant staining.  There is also a small area of about half a centimeter of the small bowel that had been stuck to the right lateral abdominal wall where there was what appeared to be some  denuded bowel wall.  We identified the ileal conduit.  I placed my finger through the urostomy externally  and we could see the intra-abdominal portion of the ileal conduit.  It was viable but thin-walled in 1 area and did have some areas where I questioned its viability.  But there was no full-thickness breakdown of it.  We lysed adhesions mainly by finger fracture for about 30 minutes.  We were able to identify the transverse colon.  The area of small bowel in the right lower quadrant that looked the most abnormal and partially ischemic was where that area of the bowel wall looked to be a little bit denuded.  We ended up finding a defect in the bowel wall in this location.  It appeared to be at or near the old small bowel anastomosis in the terminal ileum.  We identified the distal terminal ileum and what appeared to be going into the cecum.  We ended up dividing this section of the small bowel with a GIA 75 stapler with a blue load x 2 and taking down the mesentery with LigaSure and 2-0 silk ties.  The segment was probably about 10 to 15 cm long.  We were pretty confident this was the area that had perforated.  I tagged proximal and distal small bowel staple lines with a 3-0 silk suture for  later identification.  It really was not possible to run the bowel due to the significant interloop adhesions.  We then irrigated the abdomen with about 4 L of saline.  We did not feel like he needed to do definitive procedure this evening because we wanted to make sure there was no other signs of ongoing leak so we felt that leaving the previously placed surgical drains could serve as an ongoing leak test and then he could be brought back at a later date for probable end ileostomy and abdominal wall closure.  An ABThera wound VAC was obtained and the sponge was placed over the viscera blue sponge on top and plastic drapes were then placed over that on the skin.  The sponge was connected to the wound VAC and we had a good seal  without any air leak.  A new ostomy appliance was placed over the urostomy.  All needle, instrument, sponge counts were correct x 2.  The patient was left intubated and transferred to the ICU  PLAN OF CARE: ICU  PATIENT DISPOSITION:  ICU - intubated and critically ill.   Delay start of Pharmacological VTE agent (>24hrs) due to surgical blood loss or risk of bleeding:  can start heparin  gtt at 1400 without bolus  Marianna Shirk. Elvan Hamel, MD, FACS General, Bariatric, & Minimally Invasive Surgery [facility], [place]

## 2024-04-20 NOTE — Progress Notes (Addendum)
 PHARMACY - TOTAL PARENTERAL NUTRITION CONSULT NOTE   Indication: Prolonged ileus  Patient Measurements: Height: 5\' 11"  (180.3 cm) Weight: 112.1 kg (247 lb 2.2 oz) IBW/kg (Calculated) : 75.3 TPN AdjBW (KG): 83.7 Body mass index is 34.47 kg/m.  Assessment: Pt is a 20 yoM who underwent hernia repair and urostomy revision on 04/10/24. Pt developed post-op ileus. Pharmacy consulted to manage TPN, which was started on 6/2. Patient required return to OR early 6/9 am for perforated bowel; he is s/p drainage of abscesses, small bowel resection (in discontinuity) with placement of wound VAC. He returned to the ICU intubated and requiring vasopressor support. Plan for return to OR ~ 48 hrs.  Glucose / Insulin : Hx DM on metformin PTA. A1c 5.3% -CBGs previously reasonably controlled; now trending up > 200 (stress response?) -11 units of insulin  used in last 24hrs -SSI frequency increased this am Electrolytes:  -Na improved to 142 -K 5.6 - elevated (confirmed that lab was drawn peripherally by phlebotomy, NOT from central line where TPN running) -Mg 1.3 -CorrCa WNL Renal: SCr 1.49 (trending up); BUN 51 (elevated)  Hepatic: albumin  < 1.5; LFTs, alk phos WNL Intake / Output; MIVF:  -UOP > 3 L yesterday, none today so far -NG/emesis ~ 650 ml yesterday -Drain 270 mL this am -Stool last charted on 6/6 GI Imaging: -6/9: Air-fluid collection in the subcutaneous soft tissues of the right anterior pelvic wall extending from the midline to the ileostomy -6/3: Right lower quadrant small bowel loop with mural defect and adjacent extraluminal gas identified, which is concerning for underlying small bowel perforation. GI Surgeries / Procedures:  -6/9: Perforated bowel - drainage of right abdominal wall abscess as well as intra-abdominal abscess, explantation of abdominal mesh, small bowel resection (in discontinuity), placement of wound VAC -5/30: Hernia repair, lysis of adhesions, urostomy revision  Central  access: PICC TPN start date: 6/2   Nutritional Goals:  Goal TPN rate is 90 mL/hr (provides 101.5 g of protein and 2047 kcals per day)  RD Assessment: 6/6 Estimated Needs Total Energy Estimated Needs: 1950-2150 Total Protein Estimated Needs: 95-110g Total Fluid Estimated Needs: 2.1L/day  Current Nutrition:  TPN  NPO  Plan: Discussed with CCM. STOP current bag of TPN given K 5.6 (drawn peripherally) and TPN containing 160 mEq K At 1800, resume TPN at goal 90 mL/hr Remove lipids as pt is receiving ~ 576 kcal lipids from propofol  at current rate Electrolytes in TPN:  Na 30 mEq/L  K 0 mEq/L  Ca 2 mEq/L  Mg 4 mEq/L   Phos 20 mmol/L Cl:Ac 1:2 Add standard MVI and trace elements to TPN Moderate SSI frequency increased to q4h; will continue and adjust as needed. May need to consider insulin  infusion if remains elevated Monitor TPN labs on Mon/Thurs at minimum. BMP tomorrow morning. CCM repeating BMP at 1600 today.    Lolita Rise, PharmD, BCPS Clinical Pharmacist 04/20/2024 10:30 AM

## 2024-04-20 NOTE — Anesthesia Procedure Notes (Signed)
 Arterial Line Insertion Start/End6/07/2024 12:40 AM, 04/20/2024 12:42 AM Performed by: Micheal Agent, DO, anesthesiologist  Patient location: OR. Preanesthetic checklist: patient identified, IV checked, site marked, monitors and equipment checked and timeout performed Emergency situation Lidocaine  1% used for infiltration and patient sedated Left, radial was placed Catheter size: 20 G Hand hygiene performed  and maximum sterile barriers used   Attempts: 1 Procedure performed using ultrasound guided technique. Ultrasound Notes:anatomy identified, needle tip was noted to be adjacent to the nerve/plexus identified, no ultrasound evidence of intravascular and/or intraneural injection and image(s) printed for medical record Following insertion, dressing applied and Biopatch. Post procedure assessment: normal and unchanged  Patient tolerated the procedure well with no immediate complications.

## 2024-04-20 NOTE — TOC Progression Note (Signed)
 Transition of Care [facility]) - Progression Note    Patient Details  Name: Kristopher Thompson MRN: [id number] Date of Birth: 10/21/1951  Transition of Care [facility]) CM/SW Contact  Tessie Fila, RN Phone Number: 04/20/2024, 3:47 PM  Clinical Narrative:    Pt in ICU, currently receiving TPN. Recent surgery. CIR is following this pt. TOC continuing to follow.   Expected Discharge Plan: Home/Self Care Barriers to Discharge: Continued Medical Work up  Expected Discharge Plan and Services   Discharge Planning Services: CM Consult   Living arrangements for the past 2 months: Single Family Home                                       Social Determinants of Health (SDOH) Interventions SDOH Screenings   Food Insecurity: No Food Insecurity (04/12/2024)  Housing: Low Risk  (04/12/2024)  Transportation Needs: No Transportation Needs (04/12/2024)  Utilities: Not At Risk (04/12/2024)  Social Connections: Socially Integrated (04/10/2024)  Tobacco Use: Medium Risk (04/10/2024)    Readmission Risk Interventions    04/12/2024    4:58 PM  Readmission Risk Prevention Plan  Transportation Screening Complete  PCP or Specialist Appt within 3-5 Days Complete  HRI or Home Care Consult Complete  Social Work Consult for Recovery Care Planning/Counseling Complete  Palliative Care Screening Not Applicable  Medication Review Ocean[employer]) Complete

## 2024-04-20 NOTE — OR Nursing (Signed)
 50cc fluid drained out of right JP drain

## 2024-04-20 NOTE — Progress Notes (Addendum)
 eLink Physician-Brief Progress Note Patient Name: Kristopher Thompson DOB: 1951-03-01 MRN: [id number]   Date of Service  04/20/2024  HPI/Events of Note  Rapid response was called in on 8 th night for encephalopathy, right JP draining brown fluid. Taken to OT on emergent basis.  Dr Elvan Hamel, Surgeon called and updated intra op: ESBL very minimal, on 2 pressors, has art and central line, on Vent. LAPAROTOMY, EXPLORATORY Drainage of right abdominal wall abscess as well as intra-abdominal abscess Small bowel resection (in discontinuity) Placement of ABThera wound VAC    PMHx: Recent ex lap surgery  for incarcerated left inguinal hernia, urostomy revision from 04/10/2024.  DM2, PE on eliquis, on hold here due to surgery status. Obese, OSA, GERD, s/p cystectomy and ileal conduit for prostate/bladder cancer 08/08/2017, Diastolic CHF. Chronic anemia.    Data: Pre op labs Reviewed:  Pre op CxR images. Slight elevated right hemidiaphragm. No CHF or Pneumonia. Wbc was at 17, Hg at 10. Cr 1.10. 7.47/29/88/20.9  Camera: In synchrony with lung protective ventilation. Levo, neo requirement decreasing. VS stable. Not on any pain med s. On wound vac.    eICU Interventions  Dicussed with RN over camera; Pressors, labs, CxR, ABG stat ordered  Has urostomy, so no need for foley catheter. SCD for now. Hold heparin  drip. Surgery will decide when to re start.      Intervention Category Major Interventions: Respiratory failure - evaluation and management;Operative interventional procedure - evaluation  Rexann Catalan 04/20/2024, 1:31 AM  04:57 Bedside RN requests CBGs and SSi be changed to q4.  - changed  Getting a replacement A line. Previous one was positional and cuff pressers were very different. Ok  06:37 Camera evaluation done for Bilateral soft wrist restraints request from bed side RN, to prevent self injury and harm while critically ill, altered mentation, on Vent.

## 2024-04-20 NOTE — Anesthesia Procedure Notes (Signed)
 Procedure Name: Intubation Date/Time: 04/20/2024 12:21 AM  Performed by: Micheal Agent, DOPre-anesthesia Checklist: Patient identified, Emergency Drugs available, Suction available and Patient being monitored Patient Re-evaluated:Patient Re-evaluated prior to induction Oxygen Delivery Method: Circle System Utilized Preoxygenation: Pre-oxygenation with 100% oxygen Induction Type: IV induction and Rapid sequence Ventilation: Mask ventilation without difficulty Laryngoscope Size: Glidescope and 4 Grade View: Grade II Tube type: Oral Tube size: 8.0 mm Number of attempts: 1 Airway Equipment and Method: Stylet and Oral airway Placement Confirmation: ETT inserted through vocal cords under direct vision, positive ETCO2 and breath sounds checked- equal and bilateral Secured at: 23 cm Tube secured with: Tape Dental Injury: Teeth and Oropharynx as per pre-operative assessment

## 2024-04-20 NOTE — Procedures (Signed)
 Arterial Line Insertion Start/End6/07/2024 10:15 AM  Patient location: ICU. Preanesthetic checklist: patient identified, site marked, surgical consent, monitors and equipment checked and timeout performed Right, radial was placed Catheter size: 20 G Hand hygiene performed  and maximum sterile barriers used  Allen's test indicative of satisfactory collateral circulation Attempts: 1 Procedure performed without using ultrasound guided technique. Following insertion, Biopatch and dressing applied. Post procedure assessment: normal  Patient tolerated the procedure well with no immediate complications.

## 2024-04-20 NOTE — Inpatient Diabetes Management (Addendum)
 Inpatient Diabetes Program Recommendations  AACE/ADA: New Consensus Statement on Inpatient Glycemic Control (2015)  Target Ranges:  Prepandial:   less than 140 mg/dL      Peak postprandial:   less than 180 mg/dL (1-2 hours)      Critically ill patients:  140 - 180 mg/dL   Lab Results  Component Value Date   GLUCAP 285 (H) 04/20/2024   HGBA1C 5.3 04/01/2024    Review of Glycemic Control  Latest Reference Range & Units 04/19/24 12:12 04/19/24 18:29 04/20/24 02:53 04/20/24 04:37 04/20/24 07:57  Glucose-Capillary 70 - 99 mg/dL 161 (H) 096 (H) 045 (H) 222 (H) 285 (H)   Diabetes history: DM 2 Outpatient Diabetes medications:  Metformin 500 mg q AM Current orders for Inpatient glycemic control:  Novolog  0-15 units q 4 hours TPN at 90 ml/hr Inpatient Diabetes Program Recommendations:    Note CBG's trending up. May consider ICU glycemic control order set (standard Novolog  2-4-6 q 4 hours)?    Thanks,  Josefa Ni, RN, BC-ADM Inpatient Diabetes Coordinator Pager [phone]  (8a-5p)

## 2024-04-20 NOTE — Progress Notes (Signed)
 Inpatient Rehab Admissions Coordinator:   CIR consult received. Pt. Just back from OR today, on TPN. He is not yet medically stable for CIR admit, but I will follow up once he is stable and participating with therapies.   Wandalee Gust, MS, CCC-SLP Rehab Admissions Coordinator  [phone] (celll) [phone] (office)

## 2024-04-20 NOTE — Progress Notes (Addendum)
 04/20/2024  Kristopher Thompson [id number] [birth year]  CARE TEAM: PCP: Dudley Ghee, MD  Outpatient Care Team: Patient Care Team: Dudley Ghee, MD as PCP - General (Internal Medicine) Devon Fogo, MD (Inactive) as Consulting Physician (Dermatology) Lavina Pou, MD as Referring Physician (Pulmonary Disease) Candyce Champagne, MD as Consulting Physician (General Surgery) Florencio Hunting, MD as Consulting Physician (Urology)  Inpatient Treatment Team: Treatment Team:  Candyce Champagne, MD Etter Hermann., MD Florencio Hunting, MD Dorena Gander, CCC-SLP Pccm, Md, MD Aubery Blare, RN [place], [place], NT [place], [place], [place] Rosie Cookey, RN Tera Fellows, RN   Problem List:   Principal Problem:   Incarcerated incisional hernia Active Problems:   S/P ileal conduit [facility])   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 08/08/2017   GERD (gastroesophageal reflux disease)   Obesity (BMI 35.0-39.9 without comorbidity)   Prolonged QT interval   Non-recurrent bilateral inguinal hernia without obstruction or gangrene   Sinus tachycardia   Tachypnea   Acute respiratory insufficiency, postoperative   Sepsis due to undetermined organism (HCC)   Lactic acidosis   Class 2 obesity   Chronic anticoagulation   Hearing loss   History of bladder cancer   Obstructive sleep apnea of adult   Parastomal hernia of ileal conduit   Partial small bowel obstruction (HCC)   Personal history of PE (pulmonary embolism)   04/10/2024  POST-OPERATIVE DIAGNOSIS:  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   PROCEDURE:   -ROBOTIC LYSIS OF ADHESIONS X 4 HOURS -COMPONENT SEPARATION - TRANSVERSUS ABDOMINIS REALEASE (TAR) BILATERAL -ROBOTIC REPAIRS OF  INCISIONAL, PARASTOMAL, LEFT INGUINAL  INCARCERATED ABDOMINAL WALL HERNIAS WITH MESH -UROSTOMY ILEAL CONDUIT REVISION -INTRAOPERATIVE ASSESSMENT OF TISSUE VASCULAR PERFUSION USING ICG (indocyanine green )  -IMMUNOFLUORESCENCE -TRANSVERSUS ABDOMINIS PLANE (TAP) BLOCK - BILATERAL    SURGEON:  Eddye Goodie, MD  OR FINDINGS:  Patient had dense adhesions anterior abdominal wall.  Largest hernia was in the right lower quadrant around the ileal conduit urostomy.  16 x 12 cm region.  Incarcerated with over a foot of small bowel.  Midline next largest 9 x 8 cm incarcerated with numerous loops of small bowel.  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  Another direct space hernia in the left lower quadrant and indirect hernia on the left lower quadrant.  No obvious hernias on the right side.   Component separation's done on both sides to try and get the largest midline and parastomal hernias close down with running serrated try to fix absorbable suture.  Broad underlay repair with 30 x 35 cm mesh transverse    Assessment [facility] Stay = 10 days) * Day of Surgery *    Critical but starting to stabilize   Plan:  Updated by Dr. Elvan Hamel on interoperative findings.  Looks like delayed ileal leak 15 cm from the ileocecal valve close told ileal conduit donor anastomosis.  Left in discontinuity with damage control surgery last night =  patient needs second-look operation.  Typically do 48 hours = tomorrow afternoon.  Plan aggressive washout.  See if I can do an ileal ileal anastomosis and then bring up a diverting loop ileostomy proximal to that.  If does not look viable and bring up end ileostomy and regroup.  See if I can use a large Phasix mesh to try and bridge his numerous hernias to avoid recurrent incarceration/strangulation which is high risk in this patient.  Very high risk.  Discussed at length  with the patient's wife at bedside along with critical nursing and critical care attending.  Prognosis is still very guarded but wish to remain aggressive.  Questions answered.  Patient's wife appreciates care and wishes to stay aggressive for now.  Discussed with the OR team about finding  time tomorrow afternoon to get this done  The anatomy & physiology of the digestive tract was discussed.  The pathophysiology of perforation was discussed.  Differential diagnosis such as perforated ulcer or colon, etc was discussed.   Natural history risks without surgery such as death was discussed.  I recommended abdominal exploration to diagnose & treat the source of the problem.  Laparoscopic & open techniques were discussed.   Risks such as bleeding, infection, abscess, leak, reoperation, bowel resection, possible ostomy, injury to other organs, need for repair of tissues / organs, hernia, heart attack, death, and other risks were discussed.   The risks of no intervention will lead to serious problems including death.   I expressed a good likelihood that surgery will address the problem.    Goals of post-operative recovery were discussed as well.  We will work to minimize complications although risks in an emergent setting are high.   Questions were answered.  The patient expressed understanding & wishes to proceed with surgery.      Pressure support.  Agree with switching from Neo to vasopressin & continue Levophed .  Volume as needed.  Antibiotics.  Agree with using piperacillin /tazobactam for now.  Would add antifungal since its prolonged and with delayed bowel perforation for better coverage.  Micafungin.  Discussed with Dr. Diania Fortes with pulmonary critical care who is thinking the same.  Help appreciated.  Vent support.  Minimal settings a guarded sign that he is not fully is going into worsening respiratory failure.  Follow ileal conduit.  Concern for it being a little beat up but Cr normal & functioning and he is not oliguric at this time.  Will try and do our due diligence to avoid any injury and that since redo/reconstruction would be very complicated.  Follow closely.  NG tube for persistent ileus  TPN to avoid malnutrition.  CT of chest negative for any obvious pulmonary embolism or  pneumonia.  Oxygen needs less.  Echocardiogram shows grade 1 diastolic dysfunction which I believe is his baseline.  Defer to critical care/internal medicine they feel further is needed aside for some monitor diuresis.  ABLA on top of anemia of chronic disease improved with transfusion.  Follow.    -monitor electrolytes & replace as needed.  Keep K>4, Mg>2, Phos>3.  Hypokalemia not surprising given diuresis and high NG tube output.  Replete IV.  -Diabetes.  Sliding scale insulin .  Some mild hyperglycemia but nothing too severe.  Keep slightly elevated/on the sweet side to avoid severe hypoglycemic events.  -VTE prophylaxis- SCDs.  Anticoagulation prophyllaxis SQ as appropriate.  With h/o PE, CCM wished to be aggressive with hep gtt - follow Hgb & BP closely.  -  I updated the patient's status to the the patient, his wife at bedside, nurse in room.  Recommendations were made.  Questions were answered.  They expressed understanding & appreciation.  -Disposition: He is going to be here a while       I reviewed nursing notes, last 24 h vitals and pain scores, last 48 h intake and output, last 24 h labs and trends, and last 24 h imaging results.  I have reviewed this patient's available data, including medical history, events of  note, test results, etc as part of my evaluation.   A significant portion of that time was spent in counseling. Care during the described time interval was provided by me.  This care required high  level of medical decision making.  04/20/2024    Subjective: (Chief complaint)  Worsening pain and drainage became bilious/feculent yesterday.  Taken the OR last night.  Pinhole delayed leak noted with small bowel resection and left in discontinuity.  Open ABThera wound VAC placed.  In ICU on 2 pressors.  Making urine.  Very minimal vent settings.  Wife and critical care nursing at bedside.  Objective:  Vital signs:  Vitals:   04/20/24 0645 04/20/24 0700  04/20/24 0715 04/20/24 0730  BP:  (!) 139/55    Pulse: (!) 102 97 95 73  Resp: (!) 22 (!) 21 (!) 22 (!) 22  Temp: 99.9 F (37.7 C) 99.7 F (37.6 C) 99.9 F (37.7 C) 99.9 F (37.7 C)  TempSrc:      SpO2:      Weight:      Height:        Last BM Date : 04/17/24  Intake/Output   Yesterday:  06/08 0701 - 06/09 0700 In: 4252.4 [I.V.:3252.4; IV Piggyback:1000] Out: 2941 [Urine:2000; Emesis/NG output:240; Drains:601; Blood:100] This shift:  Total I/O In: 1713.5 [I.V.:1571; IV Piggyback:142.5] Out: -   Bowel function:  Flatus: YES  BM:  YES -small  Drains:  RLQ in hernia sac subcutaneous tissues of abdominal wall: Mostly serosanguineous with some bile staining  LUQ intraperitoneal / pelvis:  Serosanguineous with some bile staining   Physical Exam:  General: Pt intubated and sedated.  Not agitated Eyes: PERRL, normal EOM.  Sclera clear.  No icterus Neuro: CN II-XII intact w/o focal sensory/motor deficits. Lymph: No head/neck/groin lymphadenopathy Psych:  No delerium/psychosis/paranoia.  Oriented x 4.  No confusion. HENT: Normocephalic, Mucus membranes moist.  No thrush.  Mumbling a little bit when he talks. Neck: Supple, No tracheal deviation.  No obvious thyromegaly Chest: No pain to chest wall compression.  Good respiratory excursion.  No audible wheezing.  Lungs clear to auscultation without wheezing, rales, nor rhonchi CV:  Pulses intact.  Regular rhythm.  No major extremity edema MS: Normal AROM mjr joints.  No obvious deformity  Abdomen: Obese but Soft.  Nondistended.  ABThera sponge in midline with scant serosanguineous questionable viability as effluent.  Urostomy right lower quadrant some edema clear by lightly yellow stool.  Neurology down.  They state  Ext:   No deformity.  No mjr edema.  No cyanosis Skin: No petechiae / purpurea.  No major sores.  Warm and dry    Results:   Cultures: Recent Results (from the past 720 hours)  Culture, blood (Routine X  2) w Reflex to ID Panel     Status: None   Collection Time: 04/13/24  7:13 AM   Specimen: BLOOD  Result Value Ref Range Status   Specimen Description   Final    BLOOD BLOOD LEFT ARM AEROBIC BOTTLE ONLY ANAEROBIC BOTTLE ONLY Performed at [facility], [address] [place], [place] [address]    Special Requests   Final    BOTTLES DRAWN AEROBIC AND ANAEROBIC Blood Culture results may not be optimal due to an inadequate volume of blood received in culture bottles Performed at [facility], [address], [place], [place] [address]    Culture   Final    NO GROWTH 5 DAYS Performed at [facility], [address], [place], [place] [address]    Report Status 04/18/2024 FINAL  Final  Culture, blood (Routine X 2) w Reflex to ID Panel     Status: None   Collection Time: 04/13/24  7:20 AM   Specimen: BLOOD  Result Value Ref Range Status   Specimen Description   Final    BLOOD BLOOD RIGHT ARM AEROBIC BOTTLE ONLY ANAEROBIC BOTTLE ONLY Performed at [facility], [address] [place], [place] [address]    Special Requests   Final    BOTTLES DRAWN AEROBIC AND ANAEROBIC Blood Culture results may not be optimal due to an inadequate volume of blood received in culture bottles Performed at [facility], [address], [place], [place] [address]    Culture   Final    NO GROWTH 5 DAYS Performed at [facility], [address], [place], [place] [address]    Report Status 04/18/2024 FINAL  Final  Urine Culture     Status: None   Collection Time: 04/13/24 10:18 AM   Specimen: Urine, Clean Catch  Result Value Ref Range Status   Specimen Description   Final    URINE, CLEAN CATCH Performed at [facility], [address], [place], [place] [address]    Special Requests   Final    NONE Performed at [facility], [address] [place], [place] [address]    Culture    Final    NO GROWTH Performed at [facility], [address], [place], [place] [address]    Report Status 04/14/2024 FINAL  Final  MRSA Next Gen by PCR, Nasal     Status: None   Collection Time: 04/13/24 11:35 AM   Specimen: Nasal Mucosa; Nasal Swab  Result Value Ref Range Status   MRSA by PCR Next Gen NOT DETECTED NOT DETECTED Final    Comment: (NOTE) The GeneXpert MRSA Assay (FDA approved for NASAL specimens only), is one component of a comprehensive MRSA colonization surveillance program. It is not intended to diagnose MRSA infection nor to guide or monitor treatment for MRSA infections. Test performance is not FDA approved in patients less than 65 years old. Performed at [facility], [address] [place], [place] [address]     Labs: Results for orders placed or performed during the hospital encounter of 04/10/24 (from the past 48 hours)  Heparin  level (unfractionated)     Status: None   Collection Time: 04/18/24 10:59 AM  Result Value Ref Range   Heparin  Unfractionated 0.30 0.30 - 0.70 IU/mL    Comment: (NOTE) The clinical reportable range upper limit is being lowered to >1.10 to align with the FDA approved guidance for the current laboratory assay.  If heparin  results are below expected values, and patient dosage has  been confirmed, suggest follow up testing of antithrombin III levels. Performed at [facility], [address] [place], [place] [address]   Glucose, capillary     Status: Abnormal   Collection Time: 04/18/24 11:07 AM  Result Value Ref Range   Glucose-Capillary 148 (H) 70 - 99 mg/dL    Comment: Glucose reference range applies only to samples taken after fasting for at least 8 hours.  Glucose, capillary     Status: Abnormal   Collection Time: 04/18/24  5:10 PM  Result Value Ref Range   Glucose-Capillary 143 (H) 70 - 99 mg/dL    Comment: Glucose  reference range applies only to samples taken after fasting  for at least 8 hours.  Heparin  level (unfractionated)     Status: None   Collection Time: 04/18/24 10:51 PM  Result Value Ref Range   Heparin  Unfractionated 0.41 0.30 - 0.70 IU/mL    Comment: (NOTE) The clinical reportable range upper limit is being lowered to >1.10 to align with the FDA approved guidance for the current laboratory assay.  If heparin  results are below expected values, and patient dosage has  been confirmed, suggest follow up testing of antithrombin III levels. Performed at [facility], [address] [place], [place] [address]   Glucose, capillary     Status: Abnormal   Collection Time: 04/19/24 12:23 AM  Result Value Ref Range   Glucose-Capillary 147 (H) 70 - 99 mg/dL    Comment: Glucose reference range applies only to samples taken after fasting for at least 8 hours.  CBC     Status: Abnormal   Collection Time: 04/19/24  5:08 AM  Result Value Ref Range   WBC 13.6 (H) 4.0 - 10.5 K/uL   RBC 3.35 (L) 4.22 - 5.81 MIL/uL   Hemoglobin 9.6 (L) 13.0 - 17.0 g/dL   HCT 57.8 (L) 46.9 - 62.9 %   MCV 92.5 80.0 - 100.0 fL   MCH 28.7 26.0 - 34.0 pg   MCHC 31.0 30.0 - 36.0 g/dL   RDW 52.8 (H) 41.3 - 24.4 %   Platelets 297 150 - 400 K/uL   nRBC 0.1 0.0 - 0.2 %    Comment: Performed at [facility], [address], [place], [place] [address]  Basic metabolic panel with GFR     Status: Abnormal   Collection Time: 04/19/24  5:08 AM  Result Value Ref Range   Sodium 152 (H) 135 - 145 mmol/L   Potassium 4.3 3.5 - 5.1 mmol/L   Chloride 124 (H) 98 - 111 mmol/L   CO2 21 (L) 22 - 32 mmol/L   Glucose, Bld 172 (H) 70 - 99 mg/dL    Comment: Glucose reference range applies only to samples taken after fasting for at least 8 hours.   BUN 50 (H) 8 - 23 mg/dL   Creatinine, Ser 2.53 0.61 - 1.24 mg/dL   Calcium  8.7 (L) 8.9 - 10.3 mg/dL   GFR, Estimated >66 >44 mL/min    Comment: (NOTE) Calculated using the CKD-EPI Creatinine Equation (2021)     Anion gap 7 5 - 15    Comment: Performed at [facility], [address], [place], [place] [address]  Magnesium      Status: None   Collection Time: 04/19/24  5:08 AM  Result Value Ref Range   Magnesium  2.2 1.7 - 2.4 mg/dL    Comment: Performed at [facility], [address] [place], [place] [address]  Phosphorus     Status: None   Collection Time: 04/19/24  5:08 AM  Result Value Ref Range   Phosphorus 2.9 2.5 - 4.6 mg/dL    Comment: Performed at [facility], [address], [place], [place] [address]  Heparin  level (unfractionated)     Status: None   Collection Time: 04/19/24  5:08 AM  Result Value Ref Range   Heparin  Unfractionated 0.39 0.30 - 0.70 IU/mL    Comment: (NOTE) The clinical reportable range upper limit is being lowered to >1.10 to align with the FDA approved guidance for the current laboratory assay.  If heparin   results are below expected values, and patient dosage has  been confirmed, suggest follow up testing of antithrombin III levels. Performed at [facility], [address] [place], [place] [address]   Glucose, capillary     Status: Abnormal   Collection Time: 04/19/24  6:17 AM  Result Value Ref Range   Glucose-Capillary 146 (H) 70 - 99 mg/dL    Comment: Glucose reference range applies only to samples taken after fasting for at least 8 hours.  Glucose, capillary     Status: Abnormal   Collection Time: 04/19/24 12:12 PM  Result Value Ref Range   Glucose-Capillary 150 (H) 70 - 99 mg/dL    Comment: Glucose reference range applies only to samples taken after fasting for at least 8 hours.  Glucose, capillary     Status: Abnormal   Collection Time: 04/19/24  6:29 PM  Result Value Ref Range   Glucose-Capillary 135 (H) 70 - 99 mg/dL    Comment: Glucose reference range applies only to samples taken after fasting for at least 8 hours.  Blood gas, arterial     Status: Abnormal    Collection Time: 04/19/24 10:14 PM  Result Value Ref Range   FIO2 21 %   pH, Arterial 7.47 (H) 7.35 - 7.45   pCO2 arterial 29 (L) 32 - 48 mmHg   pO2, Arterial 88 83 - 108 mmHg   Bicarbonate 20.9 20.0 - 28.0 mmol/L   Acid-base deficit 1.4 0.0 - 2.0 mmol/L   O2 Saturation 98.9 %   Patient temperature 37.4    Collection site RIGHT RADIAL    Drawn by [id number]    Allens test (pass/fail) PASS PASS    Comment: Performed at [facility], [address] [place], [place] [address]  Prealbumin     Status: Abnormal   Collection Time: 04/19/24 10:59 PM  Result Value Ref Range   Prealbumin 12 (L) 18 - 38 mg/dL    Comment: Performed at [facility], [address] [place], [place] [address]  CBC     Status: Abnormal   Collection Time: 04/19/24 10:59 PM  Result Value Ref Range   WBC 17.7 (H) 4.0 - 10.5 K/uL   RBC 3.71 (L) 4.22 - 5.81 MIL/uL   Hemoglobin 10.4 (L) 13.0 - 17.0 g/dL   HCT 65.7 (L) 84.6 - 96.2 %   MCV 92.7 80.0 - 100.0 fL   MCH 28.0 26.0 - 34.0 pg   MCHC 30.2 30.0 - 36.0 g/dL   RDW 95.2 (H) 84.1 - 32.4 %   Platelets 355 150 - 400 K/uL   nRBC 0.2 0.0 - 0.2 %    Comment: Performed at [facility], [address] [place], [place] [address]  Basic metabolic panel     Status: Abnormal   Collection Time: 04/19/24 10:59 PM  Result Value Ref Range   Sodium 149 (H) 135 - 145 mmol/L   Potassium 4.5 3.5 - 5.1 mmol/L   Chloride 122 (H) 98 - 111 mmol/L   CO2 21 (L) 22 - 32 mmol/L   Glucose, Bld 180 (H) 70 - 99 mg/dL    Comment: Glucose reference range applies only to samples taken after fasting for at least 8 hours.   BUN 45 (H) 8 - 23 mg/dL   Creatinine, Ser 7.25 0.61 - 1.24 mg/dL   Calcium  8.6 (L) 8.9 - 10.3 mg/dL   GFR, Estimated >36 >64 mL/min    Comment: (NOTE) Calculated using  the CKD-EPI Creatinine Equation (2021)    Anion gap 6 5 - 15    Comment: Performed at [facility], [address], [place], [place]  [address]  Type and screen [facility]     Status: None   Collection Time: 04/19/24 11:13 PM  Result Value Ref Range   ABO/RH(D) AB POS    Antibody Screen NEG    Sample Expiration      04/22/2024,2359 Performed at [facility], [address] [place], [place] [address]   I-STAT 7, (LYTES, BLD GAS, ICA, H+H)     Status: Abnormal   Collection Time: 04/20/24  1:34 AM  Result Value Ref Range   pH, Arterial 7.288 (L) 7.35 - 7.45   pCO2 arterial 39.0 32 - 48 mmHg   pO2, Arterial 102 83 - 108 mmHg   Bicarbonate 18.7 (L) 20.0 - 28.0 mmol/L   TCO2 20 (L) 22 - 32 mmol/L   O2 Saturation 97 %   Acid-base deficit 7.0 (H) 0.0 - 2.0 mmol/L   Sodium 151 (H) 135 - 145 mmol/L   Potassium 5.3 (H) 3.5 - 5.1 mmol/L   Calcium , Ion 1.22 1.15 - 1.40 mmol/L   HCT 26.0 (L) 39.0 - 52.0 %   Hemoglobin 8.8 (L) 13.0 - 17.0 g/dL   Sample type ARTERIAL   Glucose, capillary     Status: Abnormal   Collection Time: 04/20/24  2:53 AM  Result Value Ref Range   Glucose-Capillary 222 (H) 70 - 99 mg/dL    Comment: Glucose reference range applies only to samples taken after fasting for at least 8 hours.  Comprehensive metabolic panel     Status: Abnormal   Collection Time: 04/20/24  2:54 AM  Result Value Ref Range   Sodium 140 135 - 145 mmol/L   Potassium 4.8 3.5 - 5.1 mmol/L   Chloride 115 (H) 98 - 111 mmol/L   CO2 13 (L) 22 - 32 mmol/L   Glucose, Bld 154 (H) 70 - 99 mg/dL    Comment: Glucose reference range applies only to samples taken after fasting for at least 8 hours.   BUN 35 (H) 8 - 23 mg/dL   Creatinine, Ser 3.08 0.61 - 1.24 mg/dL   Calcium  7.0 (L) 8.9 - 10.3 mg/dL   Total Protein 3.4 (L) 6.5 - 8.1 g/dL   Albumin  <1.5 (L) 3.5 - 5.0 g/dL   AST 18 15 - 41 U/L   ALT 14 0 - 44 U/L   Alkaline Phosphatase 29 (L) 38 - 126 U/L   Total Bilirubin 0.9 0.0 - 1.2 mg/dL   GFR, Estimated >65 >78 mL/min    Comment: (NOTE) Calculated using the CKD-EPI Creatinine Equation (2021)     Anion gap 12 5 - 15    Comment: Performed at [facility], [address] [place], [place] [address]  Magnesium      Status: Abnormal   Collection Time: 04/20/24  2:54 AM  Result Value Ref Range   Magnesium  1.3 (L) 1.7 - 2.4 mg/dL    Comment: Performed at [facility], [address] [place], [place] [address]  Phosphorus     Status: None   Collection Time: 04/20/24  2:54 AM  Result Value Ref Range   Phosphorus 3.0 2.5 - 4.6 mg/dL    Comment: Performed at [facility], [address] [place], [place] [address]  CBC     Status: Abnormal   Collection  Time: 04/20/24  2:54 AM  Result Value Ref Range   WBC 23.2 (H) 4.0 - 10.5 K/uL   RBC 3.21 (L) 4.22 - 5.81 MIL/uL   Hemoglobin 9.0 (L) 13.0 - 17.0 g/dL   HCT 30.8 (L) 65.7 - 84.6 %   MCV 94.4 80.0 - 100.0 fL   MCH 28.0 26.0 - 34.0 pg   MCHC 29.7 (L) 30.0 - 36.0 g/dL   RDW 96.2 (H) 95.2 - 84.1 %   Platelets 321 150 - 400 K/uL   nRBC 0.1 0.0 - 0.2 %    Comment: Performed at [facility], [address], [place], [place] [address]  Blood gas, arterial     Status: Abnormal   Collection Time: 04/20/24  2:56 AM  Result Value Ref Range   FIO2 40 %   Delivery systems VENTILATOR    Mode PRESSURE REGULATED VOLUME CONTROL    MECHVT 600 mL   RATE 18 resp/min   PEEP 5 cm H20   pH, Arterial 7.25 (L) 7.35 - 7.45   pCO2 arterial 45 32 - 48 mmHg   pO2, Arterial 108 83 - 108 mmHg   Bicarbonate 19.7 (L) 20.0 - 28.0 mmol/L   Acid-base deficit 7.0 (H) 0.0 - 2.0 mmol/L   O2 Saturation 100 %   Patient temperature 37.4    Collection site A-LINE    Allens test (pass/fail) PASS PASS    Comment: Performed at [facility], [address] [place], [place] [address]  Glucose, capillary     Status: Abnormal   Collection Time: 04/20/24  4:37 AM  Result Value Ref Range   Glucose-Capillary 222 (H) 70 - 99 mg/dL    Comment: Glucose reference range applies only  to samples taken after fasting for at least 8 hours.  Blood gas, arterial     Status: Abnormal   Collection Time: 04/20/24  5:15 AM  Result Value Ref Range   FIO2 40 %   Delivery systems VENTILATOR    Mode PRESSURE REGULATED VOLUME CONTROL    MECHVT 1,600 mL   RATE 18 resp/min   PEEP 8 cm H20   pH, Arterial 7.33 (L) 7.35 - 7.45   pCO2 arterial 43 32 - 48 mmHg   pO2, Arterial 115 (H) 83 - 108 mmHg   Bicarbonate 22.7 20.0 - 28.0 mmol/L   Acid-base deficit 2.9 (H) 0.0 - 2.0 mmol/L   O2 Saturation 99.6 %   Patient temperature 37.7    Collection site A-LINE    Allens test (pass/fail) PASS PASS    Comment: Performed at [facility], [address] [place], [place] [address]    Imaging / Studies: DG CHEST PORT 1 VIEW Result Date: 04/20/2024 CLINICAL DATA:  Check endotracheal tube placement EXAM: PORTABLE CHEST 1 VIEW COMPARISON:  Film from the previous day. FINDINGS: Tracheal tube is noted 3.6 cm above the carina. Right PICC is seen with the catheter tip at the cavoatrial junction stable from the prior exam. Gastric catheter extends into the stomach. Overall inspiratory effort is poor although no focal confluent infiltrate is seen. IMPRESSION: Tubes and lines as described.  No acute abnormality noted. Electronically Signed   By: Violeta Grey M.D.   On: 04/20/2024 03:26   DG Chest Port 1 View Result Date: 04/19/2024 CLINICAL DATA:  Respiratory distress EXAM: PORTABLE CHEST 1 VIEW COMPARISON:  04/15/2024 FINDINGS: Right PICC tip in the right atrium. Subdiaphragmatic enteric tube. Stable cardiomediastinal silhouette. Bibasilar atelectasis. Otherwise no  focal consolidation. No pleural effusion or pneumothorax. No displaced rib fractures. IMPRESSION: Bibasilar atelectasis. Electronically Signed   By: Rozell Cornet M.D.   On: 04/19/2024 22:49   CT ABDOMEN PELVIS WO CONTRAST Result Date: 04/19/2024 CLINICAL DATA:  Postoperative abdominal pain. EXAM: CT ABDOMEN AND PELVIS WITHOUT  CONTRAST TECHNIQUE: Multidetector CT imaging of the abdomen and pelvis was performed following the standard protocol without IV contrast. RADIATION DOSE REDUCTION: This exam was performed according to the departmental dose-optimization program which includes automated exposure control, adjustment of the mA and/or kV according to patient size and/or use of iterative reconstruction technique. COMPARISON:  CT abdomen pelvis dated 04/14/2024. FINDINGS: Evaluation of this exam is limited in the absence of intravenous contrast. Lower chest: Trace right pleural effusion. There are bibasilar subsegmental atelectasis. There is coronary vascular calcification. Partially visualized tip of central venous line at the cavoatrial junction. Small pockets of pneumoperitoneum.  No significant free fluid. Hepatobiliary: The liver is grossly unremarkable. No biliary dilatation. Cholecystectomy. Pancreas: Scattered pancreatic calcification sequela of chronic pancreatitis. No active inflammatory changes. Spleen: Normal in size without focal abnormality. Adrenals/Urinary Tract: The adrenal glands unremarkable. Similar appearance of moderate right and severe left hydronephrosis with moderate left renal parenchyma atrophy. There is bilateral hydroureter. Postsurgical changes of cystectomy with a right lower quadrant ileal conduit. No stone. Stomach/Bowel: Enteric tube with tip in the body of the stomach. Multiple dilated and contrast filled small bowel loops measure up to approximately 5 cm in the left lower abdomen. No discrete transition noted. Findings favor to represent reactive ileus. Contrast noted in the colon. There is sigmoid diverticulosis. The appendix is not visualized with certainty. No inflammatory changes identified in the right lower quadrant. Vascular/Lymphatic: Mild aortoiliac atherosclerotic disease. The IVC is unremarkable. No portal venous gas. There is no adenopathy. Reproductive: Prostatectomy. Other: Postsurgical  changes of anterior abdominal wall. There is air-fluid collection in the subcutaneous soft tissues of the right anterior pelvic wall extending from the midline to the ileostomy measuring approximately 5 x 12 cm. A drainage catheter noted within this collection. Additional drainage catheter noted in the pelvis. Musculoskeletal: Degenerative changes of the spine. No acute osseous pathology. IMPRESSION: 1. Postsurgical changes of cystectomy with a right lower quadrant ileal conduit. 2. Air-fluid collection in the subcutaneous soft tissues of the right anterior pelvic wall extending from the midline to the ileostomy. A drainage catheter noted within this collection. 3. Multiple dilated and contrast filled small bowel loops favor to represent reactive ileus. 4. Sigmoid diverticulosis. 5. Similar appearance of moderate right and severe left hydronephrosis with moderate left renal parenchyma atrophy. 6. Additional postoperative changes and related small pneumoperitoneum similar to prior CT. 7.  Aortic Atherosclerosis (ICD10-I70.0). Electronically Signed   By: Angus Bark M.D.   On: 04/19/2024 12:09    Medications / Allergies: per chart  Antibiotics: Anti-infectives (From admission, onward)    Start     Dose/Rate Route Frequency Ordered Stop   04/20/24 1400  piperacillin -tazobactam (ZOSYN ) IVPB 3.375 g        3.375 g 12.5 mL/hr over 240 Minutes Intravenous Every 8 hours 04/20/24 0755     04/20/24 1000  metroNIDAZOLE  (FLAGYL ) IVPB 500 mg  Status:  Discontinued        500 mg 100 mL/hr over 60 Minutes Intravenous Every 12 hours 04/20/24 0320 04/20/24 0752   04/20/24 1000  micafungin (MYCAMINE) 150 mg in sodium chloride  0.9 % 100 mL IVPB        150 mg 107.5 mL/hr over  1 Hours Intravenous Every 24 hours 04/20/24 0752     04/20/24 0245  ceFEPIme  (MAXIPIME ) 2 g in sodium chloride  0.9 % 100 mL IVPB  Status:  Discontinued        2 g 200 mL/hr over 30 Minutes Intravenous Every 8 hours 04/20/24 0232 04/20/24  0752   04/20/24 0100  metroNIDAZOLE  (FLAGYL ) IVPB 500 mg        500 mg 100 mL/hr over 60 Minutes Intravenous On call to O.R. 04/20/24 0004 04/20/24 0100   04/14/24 1800  ceFEPIme  (MAXIPIME ) 2 g in sodium chloride  0.9 % 100 mL IVPB        2 g 200 mL/hr over 30 Minutes Intravenous Every 8 hours 04/14/24 1003 04/19/24 0957   04/14/24 1600  vancomycin  (VANCOCIN ) IVPB 1000 mg/200 mL premix  Status:  Discontinued        1,000 mg 200 mL/hr over 60 Minutes Intravenous Every 24 hours 04/13/24 1420 04/14/24 1000   04/14/24 1600  vancomycin  (VANCOREADY) IVPB 1500 mg/300 mL  Status:  Discontinued        1,500 mg 150 mL/hr over 120 Minutes Intravenous Every 24 hours 04/14/24 1002 04/16/24 0744   04/14/24 1200  vancomycin  (VANCOCIN ) IVPB 1000 mg/200 mL premix  Status:  Discontinued        1,000 mg 200 mL/hr over 60 Minutes Intravenous Every 24 hours 04/13/24 1053 04/13/24 1109   04/13/24 2200  ceFEPIme  (MAXIPIME ) 2 g in sodium chloride  0.9 % 100 mL IVPB  Status:  Discontinued        2 g 200 mL/hr over 30 Minutes Intravenous Every 12 hours 04/13/24 1036 04/13/24 1109   04/13/24 2200  ceFEPIme  (MAXIPIME ) 2 g in sodium chloride  0.9 % 100 mL IVPB  Status:  Discontinued        2 g 200 mL/hr over 30 Minutes Intravenous Every 12 hours 04/13/24 1425 04/14/24 1003   04/13/24 1500  vancomycin  (VANCOCIN ) 2,000 mg in sodium chloride  0.9 % 500 mL IVPB        2,000 mg 260 mL/hr over 120 Minutes Intravenous  Once 04/13/24 1409 04/13/24 1850   04/13/24 1500  ceFEPIme  (MAXIPIME ) 2 g in sodium chloride  0.9 % 100 mL IVPB  Status:  Discontinued        2 g 200 mL/hr over 30 Minutes Intravenous Every 12 hours 04/13/24 1420 04/13/24 1425   04/13/24 1500  metroNIDAZOLE  (FLAGYL ) IVPB 500 mg  Status:  Discontinued        500 mg 100 mL/hr over 60 Minutes Intravenous Every 12 hours 04/13/24 1420 04/17/24 0725   04/13/24 1200  vancomycin  (VANCOCIN ) 2,000 mg in sodium chloride  0.9 % 500 mL IVPB  Status:  Discontinued         2,000 mg 260 mL/hr over 120 Minutes Intravenous  Once 04/13/24 1052 04/13/24 1121   04/13/24 1100  metroNIDAZOLE  (FLAGYL ) IVPB 500 mg  Status:  Discontinued        500 mg 100 mL/hr over 60 Minutes Intravenous 2 times daily 04/13/24 1007 04/13/24 1109   04/13/24 1100  vancomycin  (VANCOCIN ) IVPB 1000 mg/200 mL premix  Status:  Discontinued        1,000 mg 200 mL/hr over 60 Minutes Intravenous  Once 04/13/24 1007 04/13/24 1020   04/13/24 1015  ceFEPIme  (MAXIPIME ) 2 g in sodium chloride  0.9 % 100 mL IVPB        2 g 200 mL/hr over 30 Minutes Intravenous STAT 04/13/24 1007 04/14/24 1721   04/10/24 2200  ceFAZolin  (ANCEF ) IVPB 2g/100 mL premix        2 g 200 mL/hr over 30 Minutes Intravenous Every 8 hours 04/10/24 1833 04/11/24 0531   04/10/24 0600  ceFAZolin  (ANCEF ) IVPB 2g/100 mL premix        2 g 200 mL/hr over 30 Minutes Intravenous On call to O.R. 04/10/24 0533 04/10/24 1526         Note: Portions of this report may have been transcribed using voice recognition software. Every effort was made to ensure accuracy; however, inadvertent computerized transcription errors may be present.   Any transcriptional errors that result from this process are unintentional.    Eddye Goodie, MD, FACS, MASCRS Esophageal, Gastrointestinal & Colorectal Surgery Robotic and Minimally Invasive Surgery  [facility] [address] [place], [place] [address] [phone] Fax [phone] Main  CONTACT INFORMATION: Weekday (9AM-5PM): Call [facility] main office at [phone] Weeknight (5PM-9AM) or Weekend/Holiday: Check EPIC "Web Links" tab & use "AMION" (password " [email or web address]") for General Surgery [facility] coverage  Please, DO NOT use SecureChat  (it is not reliable communication to reach operating surgeons & will lead to a delay in care).   Epic staff messaging available for outptient concerns needing 1-2 business day response.      04/20/2024   7:55 AM

## 2024-04-20 NOTE — Progress Notes (Signed)
 NAME:  Kristopher Thompson, MRN:  [id number], DOB:  [birth year], LOS: 10 ADMISSION DATE:  04/10/2024, CONSULTATION DATE:  04/13/2024 REFERRING MD:  Dr Bonita Bussing, CHIEF COMPLAINT: Sepsis  History of Present Illness:  S/p repair of incisional/parastomal and ventral hernias, left inguinal incarcerated , abdominal hernias, lysis of adhesions, urostomy revision Day 1 postoperatively  Transferred to the ICU secondary to tachycardia, tachypnea  History of bladder cancer, prostate cancer, diastolic heart failure, diabetes, right bundle branch block, multiple abdominal surgeries  Update 04/20/24: ccm reconsulted post operatively after pt had perforated bowel and returned to OR. Pt is requiring neo and norepi at this time. He remains intubated at this time. Profoundly acidotic with bicarb of 12. Will start sodium bicarb infusion and bolus prior to initiation. Follow labs in am. Updated wife and son at bedside.   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   moderate atypia - right low back   Atypical nevus 04/04/2007   moderate to marked - right upper back (wider shave)   Atypical nevus 04/04/2007   moderate atypia - center chest (wider shave)   Atypical nevus 04/04/2007   slight atypia - right thigh   Atypical nevus 11/29/2011   mild atypia - center upper back   Atypical nevus 11/29/2011   mild atypia - center chest   Bacteremia due to Klebsiella pneumoniae 10/09/2017   Bladder cancer (HCC) dx 07/2017   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 denies   Sleep apnea    cpap   Squamous cell carcinoma of skin 05/22/2013   left cheek - CX3 + 5FU   Wears glasses    Significant Hospital Events: Including procedures, antibiotic start and stop dates in addition to other pertinent events   04/10/2024-laparoscopic surgery 04/13/24 CCM consult. Incr NE. Art line  6/3 weaning NE. Gently diuresed w 3L off  6/4 off NE. Pulled out his NGT refused replacement. After multiple emesis  6/5 cont NGT 6/6 tx to med surg 6/9: taken back to OR for perforated bowel, post-op shock  Interim History / Subjective:   Discussed case with surgery at bedside. Plan to return to the OR tomorrow. Wife updated at bedside, 4 sons will be coming in.  Remains on Neo and levo. On Cefepime /Flagyl . Has been on TPN for a week.  Objective    Blood pressure (!) 139/55, pulse 73, temperature 99.9 F (37.7 C), resp. rate (!) 22, height 5\' 11"  (1.803 m), weight 112.1 kg, SpO2 98%.    Vent Mode: PRVC FiO2 (%):  [40 %] 40 % Set Rate:  [18 bmp] 18 bmp Vt Set:  [600 mL] 600 mL PEEP:  [5 cmH20] 5 cmH20 Plateau Pressure:  [17 cmH20]  17 cmH20   Intake/Output Summary (Last 24 hours) at 04/20/2024 0754 Last data filed at 04/20/2024 1610 Gross per 24 hour  Intake 5965.92 ml  Output 2941 ml  Net 3024.92 ml   Filed Weights   04/18/24 0521 04/19/24 0500 04/20/24 0500  Weight: 99.5 kg 99.5 kg 112.1 kg    Examination: General: chronically ill appearing male, obese HENT: NCAT, Pupils pinpoint, sclera anicteric Lungs: clear to auscultation, no wheezing Cardiovascular: rrr Abdomen: wound vac in place for surgical abdominal wound - serosanguinous drainage. RLQ ileal conduit. Extremities: trace lower extremity edema, warm to touch, RUE PICC, radial arterial  line Neuro: sedated GU: yellow urine   Resolved problem list    Assessment and Plan   Shock due to sepsis from acute bowel perforation - s/p OR for bowel perforation repair 6/9 - Bowel perforation and abdominal drain management per surgery - MAP goal 65 or greater - Levophed  and neosynephrine - Add vasopressin and wean off neo if able - Consider stress dose steroids but holding off due to hindrance of tissue healing - Stope cefepime  and flagyl  - start zosyn  - Start micafungin empirically - check blood cultures  Severe Metabolic Acidosis - continue bicarb drip - Monitor renal function closely  Acute Hypoxemic Respiratory Failure Hx OSA on CPAP  -Continue mechanical ventilatory support -vap prevention  ABLA on chronic anemia  R flank hematoma  Hx PE on eliquis  -resuming heparin  infusion at 1400 today per [facility]  Incarcerated abd wall hernia a/p LOA hernia repairs urostomy ileal conduit revision  - management per surgery  Moderate Protein Calorie Malnutrition Hypoalbuminemia - continue TPN   Best Practice (right click and "Reselect all SmartList Selections" daily)   Diet/type: NPO on tpn DVT prophylaxis SCD heparin  to restart at 1400 today Pressure ulcer(s): N/A GI prophylaxis: N/A Lines: Central line,PICC art line  Foley:  N/A Code Status:  full code Last date of multidisciplinary goals of care discussion [pending per primary team]  Labs   CBC: Recent Labs  Lab 04/13/24 1835 04/14/24 0941 04/17/24 0505 04/18/24 0542 04/19/24 0508 04/19/24 2259 04/20/24 0134 04/20/24 0254  WBC 10.8*   < > 13.7* 14.0* 13.6* 17.7*  --  23.2*  NEUTROABS 8.5*  --   --   --   --   --   --   --   HGB 8.5*   < > 9.8* 9.3* 9.6* 10.4* 8.8* 9.0*  HCT 26.4*   < > 31.8* 29.8* 31.0* 34.4* 26.0* 30.3*  MCV 86.8   < > 92.7 91.4 92.5 92.7  --  94.4  PLT 152   < > 180 238 297 355  --  321   < > = values in this interval not displayed.    Basic Metabolic Panel: Recent Labs  Lab  04/16/24 0433 04/17/24 0505 04/18/24 0542 04/19/24 0508 04/19/24 2259 04/20/24 0134 04/20/24 0254  NA 143 150* 150* 152* 149* 151* 140  K 3.1* 3.8 3.8 4.3 4.5 5.3* 4.8  CL 112* 118* 122* 124* 122*  --  115*  CO2 25 24 23  21* 21*  --  13*  GLUCOSE 167* 162* 165* 172* 180*  --  154*  BUN 57* 62* 60* 50* 45*  --  35*  CREATININE 1.22 1.30* 1.12 1.21 1.10  --  0.84  CALCIUM  8.6* 9.0 8.9 8.7* 8.6*  --  7.0*  MG 2.2 2.1 2.2 2.2  --   --  1.3*  PHOS 2.9 2.7 3.0 2.9  --   --  3.0   GFR: Estimated Creatinine Clearance: 102.7 mL/min (by C-G formula based on SCr of 0.84 mg/dL). Recent Labs  Lab 04/13/24 1047 04/13/24 1414 04/13/24 1835 04/14/24 0601 04/14/24 0941 04/18/24 0542 04/19/24 0508 04/19/24 2259 04/20/24 0254  WBC  --   --    < >  --    < > 14.0* 13.6* 17.7* 23.2*  LATICACIDVEN 5.1* 5.4*  --  2.1*  --   --   --   --   --    < > = values in this interval not displayed.    Liver Function Tests: Recent Labs  Lab 04/13/24 1047 04/13/24 1835 04/16/24 0433 04/20/24 0254  AST 25 29 22 18   ALT 8 9 11 14   ALKPHOS 50 41 51 29*  BILITOT 1.0 1.6* 1.3* 0.9  PROT 5.6* 5.4* 5.4* 3.4*  ALBUMIN  2.6* 2.7* 2.1* <1.5*   No results for input(s): "LIPASE", "AMYLASE" in the last 168 hours. No results for input(s): "AMMONIA" in the last 168 hours.  ABG    Component Value Date/Time   PHART 7.33 (L) 04/20/2024 0515   PCO2ART 43 04/20/2024 0515   PO2ART 115 (H) 04/20/2024 0515   HCO3 22.7 04/20/2024 0515   TCO2 20 (L) 04/20/2024 0134   ACIDBASEDEF 2.9 (H) 04/20/2024 0515   O2SAT 99.6 04/20/2024 0515     Coagulation Profile: Recent Labs  Lab 04/13/24 1047 04/14/24 1141  INR 1.6* 1.6*    Cardiac Enzymes: No results for input(s): "CKTOTAL", "CKMB", "CKMBINDEX", "TROPONINI" in the last 168 hours.  HbA1C: Hgb A1c MFr Bld  Date/Time Value Ref Range Status  04/01/2024 10:50 AM 5.3 4.8 - 5.6 % Final    Comment:    (NOTE) Pre diabetes:          5.7%-6.4%  Diabetes:               >6.4%  Glycemic control for   <7.0% adults with diabetes     CBG: Recent Labs  Lab 04/19/24 0617 04/19/24 1212 04/19/24 1829 04/20/24 0253 04/20/24 0437  GLUCAP 146* 150* 135* 222* 222*     Critical care time: 45 minutes   [name], MD [facility] Office: [phone]   See Amion for personal pager PCCM on call pager [phone] until 7pm. Please call Elink 7p-7a. [phone]

## 2024-04-20 NOTE — Progress Notes (Signed)
 NAME:  Kristopher Thompson, MRN:  [id number], DOB:  Feb 06, 1951, LOS: 10 ADMISSION DATE:  04/10/2024, CONSULTATION DATE:  04/13/2024 REFERRING MD:  Dr Bonita Bussing, CHIEF COMPLAINT: Sepsis  History of Present Illness:  S/p repair of incisional/parastomal and ventral hernias, left inguinal incarcerated , abdominal hernias, lysis of adhesions, urostomy revision Day 1 postoperatively  Transferred to the ICU secondary to tachycardia, tachypnea  History of bladder cancer, prostate cancer, diastolic heart failure, diabetes, right bundle branch block, multiple abdominal surgeries  Update 04/20/24: ccm reconsulted post operatively after pt had perforated bowel and returned to OR. Pt is requiring neo and norepi at this time. He remains intubated at this time. Profoundly acidotic with bicarb of 12. Will start sodium bicarb infusion and bolus prior to initiation. Follow labs in am. Updated wife and son at bedside.  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   moderate atypia - right low back   Atypical nevus 04/04/2007   moderate to marked - right upper back (wider shave)   Atypical nevus 04/04/2007   moderate atypia - center chest (wider shave)   Atypical nevus 04/04/2007   slight atypia - right thigh   Atypical nevus 11/29/2011   mild atypia - center upper back   Atypical nevus 11/29/2011   mild atypia - center chest   Bacteremia due to Klebsiella pneumoniae 10/09/2017   Bladder cancer (HCC) dx 07/2017   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 denies   Sleep apnea    cpap   Squamous cell carcinoma of skin 05/22/2013   left cheek - CX3 + 5FU   Wears glasses    Significant Hospital Events: Including procedures, antibiotic start and stop dates in addition to other pertinent events   04/10/2024-laparoscopic surgery 04/13/24 CCM consult. Incr NE. Art line  6/3 weaning NE. Gently diuresed w 3L off  6/4 off NE. Pulled out his NGT refused replacement. After multiple emesis  6/5 cont NGT 6/6 tx to med surg 6/9: taken back to OR for perforated bowel.   Interim History / Subjective:  6/9: sedated and intubated at this time post op with wound vac in place as well as multiple abdominal drains in place.   Objective    Blood pressure (!) 120/91, pulse (!) 106, temperature 99.3 F (37.4 C), temperature source Oral, resp. rate 20, height 5\' 11"  (1.803 m), weight 99.5 kg, SpO2 98%.        Intake/Output Summary (Last 24 hours) at 04/20/2024 0043 Last data filed at 04/19/2024 2200 Gross per 24 hour  Intake 1943.23 ml  Output 4650 ml  Net -2706.77 ml   Filed Weights   04/16/24 0500 04/18/24 0521 04/19/24 0500  Weight: 98.4 kg  99.5 kg 99.5 kg    Examination: General: wdwn chronically and acutely ill older adult M  HENT: NCAT, perrla, mm dry but pink, NGT in place  Lungs: ctab Cardiovascular: tachycardic Abdomen: open abdomen with wound vac in place, 2 abdominal drains, ileal conduit stoma with bag in place Extremities: no c/c/e, warm to touch, RUE PICC, radial arterial line Neuro: sedated and unresponsive GU: yellow urine   Resolved problem list  Shock  Assessment and Plan   Acute post operative resp insuff req mechanical ventilation:  Hx OSA on CPAP  -titrate vent -sat/sbt in am if no immediate plans to return  to OR -on minimal settings at this time.  -vap prevention -cpap when able  Metabolic acidosis Shock 2/2 acute sepsis 2/2 acute bowel perforation -cont abx -bicarb 3 amps and then infusion -recheck labs at 1000 -further surgical plan pending per CCS -continue tpn -wound vac in place -monitor drain output   ABLA on chronic anemia  R flank hematoma  Hx PE on eliquis  P -resuming heparin  infusion at 1400 today per CCS          Best Practice (right click and "Reselect all SmartList Selections" daily)   Diet/type: NPO on tpn DVT prophylaxis SCD lovenox   --- heparin  to restart at 1400 today Pressure ulcer(s): N/A GI prophylaxis: N/A Lines: Central line,PICC art line  Foley:  Yes, and it is still needed Code Status:  full code Last date of multidisciplinary goals of care discussion [pending]  Labs   CBC: Recent Labs  Lab 04/13/24 1835 04/14/24 0941 04/16/24 0433 04/17/24 0505 04/18/24 0542 04/19/24 0508 04/19/24 2259  WBC 10.8*   < > 10.1 13.7* 14.0* 13.6* 17.7*  NEUTROABS 8.5*  --   --   --   --   --   --   HGB 8.5*   < > 8.8* 9.8* 9.3* 9.6* 10.4*  HCT 26.4*   < > 26.9* 31.8* 29.8* 31.0* 34.4*  MCV 86.8   < > 88.5 92.7 91.4 92.5 92.7  PLT 152   < > 152 180 238 297 355   < > = values in this interval not displayed.    Basic Metabolic Panel: Recent Labs  Lab 04/15/24 0444 04/16/24 0433 04/17/24 0505 04/18/24 0542 04/19/24 0508 04/19/24 2259  NA 140 143 150* 150* 152* 149*  K 3.3* 3.1* 3.8 3.8 4.3 4.5  CL 108 112* 118* 122* 124* 122*  CO2 23 25 24 23  21* 21*  GLUCOSE 134* 167* 162* 165* 172* 180*  BUN 41* 57* 62* 60* 50* 45*  CREATININE 1.19 1.22 1.30* 1.12 1.21 1.10  CALCIUM  8.2* 8.6* 9.0 8.9 8.7* 8.6*  MG 2.6* 2.2 2.1 2.2 2.2  --   PHOS 2.3* 2.9 2.7 3.0 2.9  --    GFR: Estimated Creatinine Clearance: 74.1 mL/min (by C-G formula based on SCr of 1.1 mg/dL). Recent Labs  Lab 04/13/24 0713 04/13/24 1047 04/13/24 1414 04/13/24 1835  04/14/24 0601 04/14/24 0941 04/17/24 0505 04/18/24 0542 04/19/24 0508 04/19/24 2259  WBC  --   --   --    < >  --    < > 13.7* 14.0* 13.6* 17.7*  LATICACIDVEN 3.8* 5.1* 5.4*  --  2.1*  --   --   --   --   --    < > = values in this interval not displayed.    Liver Function Tests: Recent Labs  Lab 04/13/24 1047 04/13/24 1835 04/16/24 0433  AST 25 29  22  ALT 8 9 11   ALKPHOS 50 41 51  BILITOT 1.0 1.6* 1.3*  PROT 5.6* 5.4* 5.4*  ALBUMIN  2.6* 2.7* 2.1*   No results for input(s): "LIPASE", "AMYLASE" in the last 168 hours. No results for input(s): "AMMONIA" in the last 168 hours.  ABG    Component Value Date/Time   PHART 7.47 (H) 04/19/2024 2214   PCO2ART 29 (L) 04/19/2024 2214   PO2ART 88 04/19/2024 2214   HCO3 20.9 04/19/2024 2214   TCO2 24 08/08/2017 1734   ACIDBASEDEF 1.4 04/19/2024 2214   O2SAT 98.9 04/19/2024 2214     Coagulation Profile: Recent Labs  Lab 04/13/24 1047 04/14/24 1141  INR 1.6* 1.6*    Cardiac Enzymes: No results for input(s): "CKTOTAL", "CKMB", "CKMBINDEX", "TROPONINI" in the last 168 hours.  HbA1C: Hgb A1c MFr Bld  Date/Time Value Ref Range Status  04/01/2024 10:50 AM 5.3 4.8 - 5.6 % Final    Comment:    (NOTE) Pre diabetes:          5.7%-6.4%  Diabetes:              >6.4%  Glycemic control for   <7.0% adults with diabetes     CBG: Recent Labs  Lab 04/18/24 1710 04/19/24 0023 04/19/24 0617 04/19/24 1212 04/19/24 1829  GLUCAP 143* 147* 146* 150* 135*     Critical care time: The patient is critically ill with multiple organ systems failure and requires high complexity decision making for assessment and support, frequent evaluation and titration of therapies, application of advanced monitoring technologies and extensive interpretation of multiple databases.  Critical care time 41 mins. This represents my time independent of the NPs time taking care of the pt. This is excluding procedures.    Lezlie Reddish DO [place]  [facility] 04/20/2024, 12:44 AM See Amion for pager If no response to pager, please call [phone] until 1900 After 1900 please call [facility] [phone]

## 2024-04-20 NOTE — Progress Notes (Addendum)
 eLink Physician-Brief Progress Note Patient Name: Kristopher Thompson DOB: 1951-01-22 MRN: [id number]   Date of Service  04/20/2024  HPI/Events of Note  eLink: follow up on Cortisol, if low to give stress steroids.  Notes, labs reviewed. Cortisol level drawn, still pending.  Camera: HR 71, 128/49. Sats 98%. On 3 pressors, weaning down per RN discussion.  On bicarb. In sync with vent.   eICU Interventions  To call back when cortisol level is in.      Intervention Category Intermediate Interventions: Other:;Hypotension - evaluation and management  Rexann Catalan 04/20/2024, 8:53 PM  00:01 Cortisol at 13.5. Considering on multiple pressors and stress, cortisol level should have been over 20 at least. Hydrocortisone  ordered as stress steroids. Watch glucose levels.

## 2024-04-20 NOTE — Progress Notes (Signed)
Patient transported to PACU for procedure.

## 2024-04-21 ENCOUNTER — Inpatient Hospital Stay (HOSPITAL_COMMUNITY): Admitting: Anesthesiology

## 2024-04-21 ENCOUNTER — Inpatient Hospital Stay (HOSPITAL_COMMUNITY)

## 2024-04-21 ENCOUNTER — Encounter (HOSPITAL_COMMUNITY): Payer: Self-pay | Admitting: General Surgery

## 2024-04-21 ENCOUNTER — Encounter (HOSPITAL_COMMUNITY)
Admission: RE | Disposition: A | Discharge status: Skilled Nursing Facility | Payer: Self-pay | Source: Home / Self Care | Attending: Surgery

## 2024-04-21 DIAGNOSIS — I509 Heart failure, unspecified: Secondary | ICD-10-CM | POA: Diagnosis not present

## 2024-04-21 DIAGNOSIS — K631 Perforation of intestine (nontraumatic): Secondary | ICD-10-CM | POA: Diagnosis not present

## 2024-04-21 DIAGNOSIS — G4730 Sleep apnea, unspecified: Secondary | ICD-10-CM

## 2024-04-21 DIAGNOSIS — R6521 Severe sepsis with septic shock: Secondary | ICD-10-CM | POA: Diagnosis not present

## 2024-04-21 DIAGNOSIS — R739 Hyperglycemia, unspecified: Secondary | ICD-10-CM

## 2024-04-21 DIAGNOSIS — A419 Sepsis, unspecified organism: Secondary | ICD-10-CM | POA: Diagnosis not present

## 2024-04-21 DIAGNOSIS — N179 Acute kidney failure, unspecified: Secondary | ICD-10-CM

## 2024-04-21 DIAGNOSIS — K430 Incisional hernia with obstruction, without gangrene: Secondary | ICD-10-CM | POA: Diagnosis not present

## 2024-04-21 DIAGNOSIS — E119 Type 2 diabetes mellitus without complications: Secondary | ICD-10-CM | POA: Diagnosis not present

## 2024-04-21 DIAGNOSIS — E875 Hyperkalemia: Secondary | ICD-10-CM

## 2024-04-21 HISTORY — PX: PARTIAL COLECTOMY: SHX5273

## 2024-04-21 LAB — COMPREHENSIVE METABOLIC PANEL WITH GFR
ALT: 12 U/L (ref 0–44)
AST: 19 U/L (ref 15–41)
Albumin: 2.1 g/dL — ABNORMAL LOW (ref 3.5–5.0)
Alkaline Phosphatase: 48 U/L (ref 38–126)
Anion gap: 10 (ref 5–15)
BUN: 73 mg/dL — ABNORMAL HIGH (ref 8–23)
CO2: 21 mmol/L — ABNORMAL LOW (ref 22–32)
Calcium: 6.1 mg/dL — CL (ref 8.9–10.3)
Chloride: 105 mmol/L (ref 98–111)
Creatinine, Ser: 3.13 mg/dL — ABNORMAL HIGH (ref 0.61–1.24)
GFR, Estimated: 20 mL/min — ABNORMAL LOW (ref >60)
Glucose, Bld: 309 mg/dL — ABNORMAL HIGH (ref 70–99)
Potassium: 5.8 mmol/L — ABNORMAL HIGH (ref 3.5–5.1)
Sodium: 136 mmol/L (ref 135–145)
Total Bilirubin: 0.7 mg/dL (ref 0.0–1.2)
Total Protein: 4.7 g/dL — ABNORMAL LOW (ref 6.5–8.1)

## 2024-04-21 LAB — CBC
HCT: 27 % — ABNORMAL LOW (ref 39.0–52.0)
Hemoglobin: 8 g/dL — ABNORMAL LOW (ref 13.0–17.0)
MCH: 28.3 pg (ref 26.0–34.0)
MCHC: 29.6 g/dL — ABNORMAL LOW (ref 30.0–36.0)
MCV: 95.4 fL (ref 80.0–100.0)
Platelets: 239 10*3/uL (ref 150–400)
RBC: 2.83 MIL/uL — ABNORMAL LOW (ref 4.22–5.81)
RDW: 15.9 % — ABNORMAL HIGH (ref 11.5–15.5)
WBC: 20.2 10*3/uL — ABNORMAL HIGH (ref 4.0–10.5)
nRBC: 0.1 % (ref 0.0–0.2)

## 2024-04-21 LAB — CBC WITH DIFFERENTIAL/PLATELET
Abs Immature Granulocytes: 1.08 10*3/uL — ABNORMAL HIGH (ref 0.00–0.07)
Basophils Absolute: 0.1 10*3/uL (ref 0.0–0.1)
Basophils Relative: 0 %
Eosinophils Absolute: 0 10*3/uL (ref 0.0–0.5)
Eosinophils Relative: 0 %
HCT: 26.5 % — ABNORMAL LOW (ref 39.0–52.0)
Hemoglobin: 7.8 g/dL — ABNORMAL LOW (ref 13.0–17.0)
Immature Granulocytes: 4 %
Lymphocytes Relative: 3 %
Lymphs Abs: 0.8 10*3/uL (ref 0.7–4.0)
MCH: 28.3 pg (ref 26.0–34.0)
MCHC: 29.4 g/dL — ABNORMAL LOW (ref 30.0–36.0)
MCV: 96 fL (ref 80.0–100.0)
Monocytes Absolute: 0.9 10*3/uL (ref 0.1–1.0)
Monocytes Relative: 3 %
Neutro Abs: 23.6 10*3/uL — ABNORMAL HIGH (ref 1.7–7.7)
Neutrophils Relative %: 90 %
Platelets: 281 10*3/uL (ref 150–400)
RBC: 2.76 MIL/uL — ABNORMAL LOW (ref 4.22–5.81)
RDW: 15.8 % — ABNORMAL HIGH (ref 11.5–15.5)
WBC: 26.4 10*3/uL — ABNORMAL HIGH (ref 4.0–10.5)
nRBC: 0 % (ref 0.0–0.2)

## 2024-04-21 LAB — BLOOD GAS, ARTERIAL
Acid-base deficit: 4.3 mmol/L — ABNORMAL HIGH (ref 0.0–2.0)
Acid-base deficit: 4.8 mmol/L — ABNORMAL HIGH (ref 0.0–2.0)
Bicarbonate: 23.3 mmol/L (ref 20.0–28.0)
Bicarbonate: 22.1 mmol/L (ref 20.0–28.0)
FIO2: 40 %
FIO2: 40 %
MECHVT: 600 mL
MECHVT: 600 mL
O2 Content: 17.7 L/min
O2 Saturation: 100 %
O2 Saturation: 98.8 %
PEEP: 5 cmH2O
PEEP: 5 cmH2O
Patient temperature: 37.4
Patient temperature: 37
RATE: 18 {breaths}/min
RATE: 22 {breaths}/min
pCO2 arterial: 53 mmHg — ABNORMAL HIGH (ref 32–48)
pCO2 arterial: 47 mmHg (ref 32–48)
pH, Arterial: 7.25 — ABNORMAL LOW (ref 7.35–7.45)
pH, Arterial: 7.28 — ABNORMAL LOW (ref 7.35–7.45)
pO2, Arterial: 112 mmHg — ABNORMAL HIGH (ref 83–108)
pO2, Arterial: 75 mmHg — ABNORMAL LOW (ref 83–108)

## 2024-04-21 LAB — GLUCOSE, CAPILLARY
Glucose-Capillary: 236 mg/dL — ABNORMAL HIGH (ref 70–99)
Glucose-Capillary: 255 mg/dL — ABNORMAL HIGH (ref 70–99)
Glucose-Capillary: 266 mg/dL — ABNORMAL HIGH (ref 70–99)
Glucose-Capillary: 278 mg/dL — ABNORMAL HIGH (ref 70–99)
Glucose-Capillary: 281 mg/dL — ABNORMAL HIGH (ref 70–99)
Glucose-Capillary: 285 mg/dL — ABNORMAL HIGH (ref 70–99)

## 2024-04-21 LAB — BASIC METABOLIC PANEL WITH GFR
Anion gap: 10 (ref 5–15)
BUN: 62 mg/dL — ABNORMAL HIGH (ref 8–23)
CO2: 22 mmol/L (ref 22–32)
Calcium: 6.3 mg/dL — CL (ref 8.9–10.3)
Chloride: 107 mmol/L (ref 98–111)
Creatinine, Ser: 2.47 mg/dL — ABNORMAL HIGH (ref 0.61–1.24)
GFR, Estimated: 27 mL/min — ABNORMAL LOW (ref >60)
Glucose, Bld: 259 mg/dL — ABNORMAL HIGH (ref 70–99)
Potassium: 5.2 mmol/L — ABNORMAL HIGH (ref 3.5–5.1)
Sodium: 139 mmol/L (ref 135–145)

## 2024-04-21 LAB — HEPARIN LEVEL (UNFRACTIONATED): Heparin Unfractionated: 0.34 [IU]/mL (ref 0.30–0.70)

## 2024-04-21 LAB — COOXEMETRY PANEL
Carboxyhemoglobin: 2.4 % — ABNORMAL HIGH (ref 0.5–1.5)
Methemoglobin: <0.7 % (ref 0.0–1.5)
O2 Saturation: 75.1 %
Total hemoglobin: 7.2 g/dL — ABNORMAL LOW (ref 12.0–16.0)

## 2024-04-21 LAB — TRIGLYCERIDES: Triglycerides: 64 mg/dL (ref <150)

## 2024-04-21 LAB — TYPE AND SCREEN

## 2024-04-21 LAB — LACTIC ACID, PLASMA: Lactic Acid, Venous: 1.4 mmol/L (ref 0.5–1.9)

## 2024-04-21 LAB — SURGICAL PATHOLOGY

## 2024-04-21 LAB — PREPARE RBC (CROSSMATCH)

## 2024-04-21 SURGERY — COLECTOMY, PARTIAL
Anesthesia: General

## 2024-04-21 MED ORDER — ROCURONIUM BROMIDE 10 MG/ML (PF) SYRINGE
PREFILLED_SYRINGE | INTRAVENOUS | Status: AC
  Filled 2024-04-21: qty 10

## 2024-04-21 MED ORDER — ORAL CARE MOUTH RINSE
15.0000 mL | OROMUCOSAL | Status: DC | PRN
Start: 2024-04-21 — End: 2024-05-08

## 2024-04-21 MED ORDER — VITAL HIGH PROTEIN PO LIQD
1000.0000 mL | ORAL | Status: DC
  Administered 2024-04-21 – 2024-04-22 (×2): 1000 mL

## 2024-04-21 MED ORDER — ORAL CARE MOUTH RINSE
15.0000 mL | OROMUCOSAL | Status: DC
  Administered 2024-04-21 – 2024-04-25 (×46): 15 mL via OROMUCOSAL

## 2024-04-21 MED ORDER — PROPOFOL 10 MG/ML IV BOLUS
INTRAVENOUS | Status: AC
  Filled 2024-04-21: qty 20

## 2024-04-21 MED ORDER — TRAVASOL 10 % IV SOLN
INTRAVENOUS | Status: AC
  Filled 2024-04-21: qty 1015.2

## 2024-04-21 MED ORDER — LACTATED RINGERS IV BOLUS: 1000.0000 mL | Freq: Three times a day (TID) | INTRAVENOUS | Status: DC | PRN

## 2024-04-21 MED ORDER — CALCIUM GLUCONATE-NACL 1-0.675 GM/50ML-% IV SOLN
1.0000 g | Freq: Once | INTRAVENOUS | Status: AC
  Administered 2024-04-21: 1000 mg via INTRAVENOUS
  Filled 2024-04-21: qty 50

## 2024-04-21 MED ORDER — SODIUM CHLORIDE 0.9% IV SOLUTION
Freq: Once | INTRAVENOUS | Status: DC
Start: 2024-04-21 — End: 2024-04-25

## 2024-04-21 MED ORDER — SODIUM CHLORIDE 0.9 % IV SOLN
INTRAVENOUS | Status: DC
  Filled 2024-04-21: qty 6

## 2024-04-21 MED ORDER — LACTATED RINGERS IV BOLUS
1000.0000 mL | Freq: Once | INTRAVENOUS | Status: AC
  Administered 2024-04-21: 1000 mL via INTRAVENOUS

## 2024-04-21 MED ORDER — 0.9 % SODIUM CHLORIDE (POUR BTL) OPTIME
TOPICAL | Status: DC | PRN
  Administered 2024-04-21 (×2): 2000 mL

## 2024-04-21 MED ORDER — HYDROCORTISONE SOD SUC (PF) 100 MG IJ SOLR
50.0000 mg | Freq: Four times a day (QID) | INTRAMUSCULAR | Status: DC
  Administered 2024-04-21 – 2024-04-23 (×10): 50 mg via INTRAVENOUS
  Filled 2024-04-21 (×10): qty 2

## 2024-04-21 MED ORDER — HYDROMORPHONE HCL 1 MG/ML IJ SOLN
INTRAMUSCULAR | Status: DC | PRN
  Administered 2024-04-21 (×2): .4 mg via INTRAVENOUS

## 2024-04-21 MED ORDER — INSULIN ASPART 100 UNIT/ML IJ SOLN
0.0000 [IU] | INTRAMUSCULAR | Status: DC
  Administered 2024-04-21 (×3): 11 [IU] via SUBCUTANEOUS
  Administered 2024-04-22 (×3): 7 [IU] via SUBCUTANEOUS
  Administered 2024-04-22: 11 [IU] via SUBCUTANEOUS
  Administered 2024-04-22 – 2024-04-23 (×5): 7 [IU] via SUBCUTANEOUS
  Administered 2024-04-23 (×2): 4 [IU] via SUBCUTANEOUS
  Administered 2024-04-23: 7 [IU] via SUBCUTANEOUS
  Administered 2024-04-24: 3 [IU] via SUBCUTANEOUS
  Administered 2024-04-24 (×4): 4 [IU] via SUBCUTANEOUS
  Administered 2024-04-24 (×2): 3 [IU] via SUBCUTANEOUS
  Administered 2024-04-25 (×3): 4 [IU] via SUBCUTANEOUS
  Administered 2024-04-25 (×2): 3 [IU] via SUBCUTANEOUS
  Administered 2024-04-26 (×4): 4 [IU] via SUBCUTANEOUS
  Administered 2024-04-26 (×2): 3 [IU] via SUBCUTANEOUS
  Administered 2024-04-27: 4 [IU] via SUBCUTANEOUS
  Administered 2024-04-27: 3 [IU] via SUBCUTANEOUS
  Administered 2024-04-27 (×2): 4 [IU] via SUBCUTANEOUS
  Administered 2024-04-27: 3 [IU] via SUBCUTANEOUS
  Administered 2024-04-27: 4 [IU] via SUBCUTANEOUS
  Administered 2024-04-28: 7 [IU] via SUBCUTANEOUS
  Administered 2024-04-28 (×2): 3 [IU] via SUBCUTANEOUS
  Administered 2024-04-28: 4 [IU] via SUBCUTANEOUS
  Administered 2024-04-29: 3 [IU] via SUBCUTANEOUS
  Administered 2024-04-29 (×2): 4 [IU] via SUBCUTANEOUS
  Administered 2024-04-29 – 2024-05-01 (×3): 3 [IU] via SUBCUTANEOUS
  Administered 2024-05-01 – 2024-05-02 (×2): 4 [IU] via SUBCUTANEOUS
  Administered 2024-05-02 (×2): 3 [IU] via SUBCUTANEOUS
  Administered 2024-05-02 – 2024-05-03 (×2): 4 [IU] via SUBCUTANEOUS
  Administered 2024-05-03: 1 [IU] via SUBCUTANEOUS
  Administered 2024-05-03 (×2): 3 [IU] via SUBCUTANEOUS
  Administered 2024-05-04 (×2): 4 [IU] via SUBCUTANEOUS
  Administered 2024-05-04: 3 [IU] via SUBCUTANEOUS
  Administered 2024-05-04 (×2): 4 [IU] via SUBCUTANEOUS
  Administered 2024-05-05 (×2): 3 [IU] via SUBCUTANEOUS
  Administered 2024-05-05: 4 [IU] via SUBCUTANEOUS
  Administered 2024-05-05 (×2): 3 [IU] via SUBCUTANEOUS
  Administered 2024-05-05 – 2024-05-06 (×3): 4 [IU] via SUBCUTANEOUS
  Administered 2024-05-06 (×3): 3 [IU] via SUBCUTANEOUS
  Administered 2024-05-07: 4 [IU] via SUBCUTANEOUS
  Administered 2024-05-07: 1 [IU] via SUBCUTANEOUS
  Administered 2024-05-07 – 2024-05-08 (×5): 3 [IU] via SUBCUTANEOUS
  Administered 2024-05-09: 4 [IU] via SUBCUTANEOUS
  Administered 2024-05-09 – 2024-05-10 (×3): 3 [IU] via SUBCUTANEOUS
  Administered 2024-05-10: 4 [IU] via SUBCUTANEOUS
  Administered 2024-05-10: 3 [IU] via SUBCUTANEOUS
  Administered 2024-05-10: 4 [IU] via SUBCUTANEOUS
  Administered 2024-05-10 (×2): 3 [IU] via SUBCUTANEOUS
  Administered 2024-05-11 (×2): 4 [IU] via SUBCUTANEOUS
  Administered 2024-05-11: 3 [IU] via SUBCUTANEOUS
  Administered 2024-05-11: 7 [IU] via SUBCUTANEOUS
  Administered 2024-05-11 – 2024-05-12 (×2): 3 [IU] via SUBCUTANEOUS
  Administered 2024-05-12: 4 [IU] via SUBCUTANEOUS
  Administered 2024-05-12: 3 [IU] via SUBCUTANEOUS
  Administered 2024-05-12: 4 [IU] via SUBCUTANEOUS
  Administered 2024-05-12 – 2024-05-13 (×2): 3 [IU] via SUBCUTANEOUS
  Administered 2024-05-13: 7 [IU] via SUBCUTANEOUS
  Administered 2024-05-14 (×3): 3 [IU] via SUBCUTANEOUS
  Administered 2024-05-15: 4 [IU] via SUBCUTANEOUS
  Administered 2024-05-15 – 2024-05-16 (×4): 3 [IU] via SUBCUTANEOUS
  Administered 2024-05-16 – 2024-05-18 (×4): 4 [IU] via SUBCUTANEOUS
  Administered 2024-05-18 (×2): 3 [IU] via SUBCUTANEOUS
  Administered 2024-05-19: 7 [IU] via SUBCUTANEOUS
  Administered 2024-05-19: 3 [IU] via SUBCUTANEOUS
  Administered 2024-05-19: 4 [IU] via SUBCUTANEOUS
  Administered 2024-05-20 (×3): 3 [IU] via SUBCUTANEOUS
  Administered 2024-05-20: 4 [IU] via SUBCUTANEOUS
  Administered 2024-05-21: 3 [IU] via SUBCUTANEOUS

## 2024-04-21 MED ORDER — CALCIUM GLUCONATE-NACL 2-0.675 GM/100ML-% IV SOLN
2.0000 g | Freq: Once | INTRAVENOUS | Status: AC
  Administered 2024-04-21: 2000 mg via INTRAVENOUS
  Filled 2024-04-21: qty 100

## 2024-04-21 MED ORDER — INSULIN GLARGINE-YFGN 100 UNIT/ML ~~LOC~~ SOLN
10.0000 [IU] | Freq: Every day | SUBCUTANEOUS | Status: DC
  Administered 2024-04-21: 10 [IU] via SUBCUTANEOUS
  Filled 2024-04-21: qty 0.1

## 2024-04-21 MED ORDER — LACTATED RINGERS IV SOLN: INTRAVENOUS | Status: DC | PRN

## 2024-04-21 MED ORDER — MIDAZOLAM HCL 2 MG/2ML IJ SOLN
INTRAMUSCULAR | Status: AC
  Filled 2024-04-21: qty 2

## 2024-04-21 MED ORDER — PHENYLEPHRINE 80 MCG/ML (10ML) SYRINGE FOR IV PUSH (FOR BLOOD PRESSURE SUPPORT)
PREFILLED_SYRINGE | INTRAVENOUS | Status: DC | PRN
  Administered 2024-04-21: 160 ug via INTRAVENOUS

## 2024-04-21 MED ORDER — HYDROMORPHONE HCL 2 MG/ML IJ SOLN
INTRAMUSCULAR | Status: AC
Start: 2024-04-21 — End: ?
  Filled 2024-04-21: qty 1

## 2024-04-21 MED ORDER — INSULIN GLARGINE-YFGN 100 UNIT/ML ~~LOC~~ SOLN
20.0000 [IU] | Freq: Every day | SUBCUTANEOUS | Status: DC
  Administered 2024-04-22: 20 [IU] via SUBCUTANEOUS
  Filled 2024-04-21: qty 0.2

## 2024-04-21 MED ORDER — ROCURONIUM BROMIDE 100 MG/10ML IV SOLN
INTRAVENOUS | Status: DC | PRN
  Administered 2024-04-21: 50 mg via INTRAVENOUS
  Administered 2024-04-21 (×2): 10 mg via INTRAVENOUS

## 2024-04-21 MED ORDER — INSULIN GLARGINE-YFGN 100 UNIT/ML ~~LOC~~ SOLN
10.0000 [IU] | Freq: Once | SUBCUTANEOUS | Status: AC
  Administered 2024-04-21: 10 [IU] via SUBCUTANEOUS
  Filled 2024-04-21: qty 0.1

## 2024-04-21 MED ORDER — KETAMINE HCL 50 MG/5ML IJ SOSY
PREFILLED_SYRINGE | INTRAMUSCULAR | Status: DC | PRN
  Administered 2024-04-21: 10 mg via INTRAVENOUS

## 2024-04-21 MED ORDER — SUGAMMADEX SODIUM 200 MG/2ML IV SOLN
INTRAVENOUS | Status: DC | PRN
  Administered 2024-04-21: 200 mg via INTRAVENOUS

## 2024-04-21 MED ORDER — KETAMINE HCL 50 MG/5ML IJ SOSY
PREFILLED_SYRINGE | INTRAMUSCULAR | Status: AC
  Filled 2024-04-21: qty 5

## 2024-04-21 SURGICAL SUPPLY — 57 items
BAG COUNTER SPONGE SURGICOUNT (BAG) IMPLANT
BAG DECANTER FOR FLEXI CONT (MISCELLANEOUS) IMPLANT
BAG URINE DRAIN 2000ML AR STRL (UROLOGICAL SUPPLIES) IMPLANT
BLADE EXTENDED COATED 6.5IN (ELECTRODE) IMPLANT
CELLS DAT CNTRL 66122 CELL SVR (MISCELLANEOUS) IMPLANT
CHLORAPREP W/TINT 26 (MISCELLANEOUS) ×1 IMPLANT
COUNTER NEEDLE 1200 MAGNETIC (NEEDLE) ×1 IMPLANT
COVER MAYO STAND STRL (DRAPES) ×3 IMPLANT
COVER SURGICAL LIGHT HANDLE (MISCELLANEOUS) ×1 IMPLANT
DEVICE TROCAR PUNCTURE CLOSURE (ENDOMECHANICALS) IMPLANT
DRAIN CHANNEL 19F RND (DRAIN) IMPLANT
DRAPE DERMATAC (DRAPES) IMPLANT
DRAPE LAPAROSCOPIC ABDOMINAL (DRAPES) ×1 IMPLANT
DRAPE SHEET LG 3/4 BI-LAMINATE (DRAPES) IMPLANT
DRSG OPSITE POSTOP 4X10 (GAUZE/BANDAGES/DRESSINGS) IMPLANT
DRSG OPSITE POSTOP 4X6 (GAUZE/BANDAGES/DRESSINGS) IMPLANT
DRSG OPSITE POSTOP 4X8 (GAUZE/BANDAGES/DRESSINGS) IMPLANT
DRSG TEGADERM 2-3/8X2-3/4 SM (GAUZE/BANDAGES/DRESSINGS) IMPLANT
DRSG TEGADERM 4X4.75 (GAUZE/BANDAGES/DRESSINGS) IMPLANT
DRSG VAC GRANUFOAM LG (GAUZE/BANDAGES/DRESSINGS) IMPLANT
ELECT REM PT RETURN 15FT ADLT (MISCELLANEOUS) ×1 IMPLANT
EVACUATOR SILICONE 100CC (DRAIN) IMPLANT
GAUZE 4X4 16PLY ~~LOC~~+RFID DBL (SPONGE) IMPLANT
GAUZE SPONGE 4X4 12PLY STRL (GAUZE/BANDAGES/DRESSINGS) ×1 IMPLANT
GLOVE ECLIPSE 8.0 STRL XLNG CF (GLOVE) ×2 IMPLANT
GLOVE INDICATOR 8.0 STRL GRN (GLOVE) ×2 IMPLANT
GOWN STRL REUS W/ TWL XL LVL3 (GOWN DISPOSABLE) ×5 IMPLANT
KIT TURNOVER KIT A (KITS) IMPLANT
LEGGING LITHOTOMY PAIR STRL (DRAPES) IMPLANT
LIGASURE IMPACT 36 18CM CVD LR (INSTRUMENTS) IMPLANT
MESH PHASIX ST 30X35 (Mesh General) IMPLANT
PACK COLON (CUSTOM PROCEDURE TRAY) ×1 IMPLANT
PAD POSITIONING PINK XL (MISCELLANEOUS) ×1 IMPLANT
PENCIL SMOKE EVACUATOR (MISCELLANEOUS) IMPLANT
RELOAD PROXIMATE 75MM BLUE (ENDOMECHANICALS) ×1 IMPLANT
RELOAD STAPLE 75 3.8 BLU REG (ENDOMECHANICALS) IMPLANT
RETRACTOR WND ALEXIS 18 MED (MISCELLANEOUS) IMPLANT
SPIKE FLUID TRANSFER (MISCELLANEOUS) ×1 IMPLANT
SPONGE T-LAP 18X18 ~~LOC~~+RFID (SPONGE) IMPLANT
STAPLER PROXIMATE 75MM BLUE (STAPLE) IMPLANT
STAPLER SKIN PROX 35W (STAPLE) ×1 IMPLANT
SUT MNCRL AB 4-0 PS2 18 (SUTURE) ×1 IMPLANT
SUT PDS AB 1 CT1 27 (SUTURE) IMPLANT
SUT PDS AB 1 TP1 96 (SUTURE) IMPLANT
SUT PROLENE 0 CT 2 (SUTURE) ×1 IMPLANT
SUT PROLENE 2 0 SH DA (SUTURE) IMPLANT
SUT SILK 2 0 SH CR/8 (SUTURE) ×1 IMPLANT
SUT SILK 2-0 18XBRD TIE 12 (SUTURE) ×1 IMPLANT
SUT SILK 3 0 SH CR/8 (SUTURE) ×1 IMPLANT
SUT SILK 3-0 18XBRD TIE 12 (SUTURE) ×1 IMPLANT
SUT VIC AB 2-0 SH 18 (SUTURE) IMPLANT
SUT VIC AB 3-0 SH 18 (SUTURE) IMPLANT
SUT VICRYL 0 UR6 27IN ABS (SUTURE) IMPLANT
SYSTEM UROSTOMY GENTLE TOUCH (WOUND CARE) IMPLANT
TAPE UMBILICAL 1/8 X36 TWILL (MISCELLANEOUS) ×1 IMPLANT
TOWEL OR 17X26 10 PK STRL BLUE (TOWEL DISPOSABLE) IMPLANT
TRAY FOLEY MTR SLVR 16FR STAT (SET/KITS/TRAYS/PACK) IMPLANT

## 2024-04-21 NOTE — Anesthesia Preprocedure Evaluation (Addendum)
 Anesthesia Evaluation  Patient identified by MRN, date of birth, ID band Patient unresponsive    Reviewed: Allergy & Precautions, NPO status , Patient's Chart, lab work & pertinent test results, reviewed documented beta blocker date and time , Unable to perform ROS - Chart review only  History of Anesthesia Complications Negative for: history of anesthetic complications  Airway Mallampati: Intubated       Dental   Pulmonary sleep apnea , former smoker, PE (prior)      + intubated    Cardiovascular +CHF  + dysrhythmias  Rhythm:Regular Rate:Tachycardia  1. Left ventricular ejection fraction, by estimation, is 65 to 70%. The  left ventricle has hyperdynamic function. The left ventricle has no  regional wall motion abnormalities. Left ventricular diastolic parameters  are consistent with Grade I diastolic  dysfunction (impaired relaxation).   2. Right ventricular systolic function is normal. The right ventricular  size is not well visualized. There is normal pulmonary artery systolic  pressure. The estimated right ventricular systolic pressure is 29.4 mmHg.   3. Left atrial size was mildly dilated.   4. The mitral valve is grossly normal. No evidence of mitral valve  regurgitation. No evidence of mitral stenosis.   5. The aortic valve is tricuspid. There is mild calcification of the  aortic valve. There is mild thickening of the aortic valve. Aortic valve  regurgitation is not visualized. Aortic valve sclerosis/calcification is  present, without any evidence of  aortic stenosis.     Neuro/Psych    GI/Hepatic hiatal hernia,GERD  ,,  Endo/Other  diabetes    Renal/GU ARFRenal disease     Musculoskeletal  (+) Arthritis ,    Abdominal   Peds  Hematology  (+) Blood dyscrasia, anemia   Anesthesia Other Findings   Reproductive/Obstetrics                              Anesthesia  Physical Anesthesia Plan  ASA: 4  Anesthesia Plan: General   Post-op Pain Management:    Induction: Intravenous  PONV Risk Score and Plan: 2 and Propofol  infusion  Airway Management Planned: Oral ETT  Additional Equipment: Arterial line and CVP  Intra-op Plan:   Post-operative Plan: Post-operative intubation/ventilation  Informed Consent: I have reviewed the patients History and Physical, chart, labs and discussed the procedure including the risks, benefits and alternatives for the proposed anesthesia with the patient or authorized representative who has indicated his/her understanding and acceptance.     History available from chart only and Consent reviewed with POA  Plan Discussed with: CRNA  Anesthesia Plan Comments:          Anesthesia Quick Evaluation

## 2024-04-21 NOTE — Progress Notes (Signed)
 04/21/2024  Kristopher Thompson [id number] 03/24/1951  CARE TEAM: PCP: Dudley Ghee, MD  Outpatient Care Team: Patient Care Team: Dudley Ghee, MD as PCP - General (Internal Medicine) Devon Fogo, MD (Inactive) as Consulting Physician (Dermatology) Lavina Pou, MD as Referring Physician (Pulmonary Disease) Candyce Champagne, MD as Consulting Physician (General Surgery) Florencio Hunting, MD as Consulting Physician (Urology)  Inpatient Treatment Team: Treatment Team:  Candyce Champagne, MD Etter Hermann., MD Florencio Hunting, MD Dorena Gander, CCC-SLP Pccm, Md, MD Aubery Blare, RN [place], Janell Media, NT [place], RN Carlyle Childes, RN Neila Bally, RRT Layvonne Primas, [place]   Problem List:   Principal Problem:   Incarcerated incisional hernia Active Problems:   S/P ileal conduit [facility])   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 08/08/2017   GERD (gastroesophageal reflux disease)   Obesity (BMI 35.0-39.9 without comorbidity)   Prolonged QT interval   Non-recurrent bilateral inguinal hernia without obstruction or gangrene   Sinus tachycardia   Tachypnea   Acute respiratory insufficiency, postoperative   Sepsis due to undetermined organism (HCC)   Lactic acidosis   Class 2 obesity   Chronic anticoagulation   Hearing loss   History of bladder cancer   Obstructive sleep apnea of adult   Parastomal hernia of ileal conduit   Partial small bowel obstruction (HCC)   Personal history of PE (pulmonary embolism)   04/10/2024  POST-OPERATIVE DIAGNOSIS:  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   PROCEDURE:   -ROBOTIC LYSIS OF ADHESIONS X 4 HOURS -COMPONENT SEPARATION - TRANSVERSUS ABDOMINIS REALEASE (TAR) BILATERAL -ROBOTIC REPAIRS OF  INCISIONAL, PARASTOMAL, LEFT INGUINAL  INCARCERATED ABDOMINAL WALL HERNIAS WITH MESH -UROSTOMY ILEAL CONDUIT REVISION -INTRAOPERATIVE ASSESSMENT OF TISSUE VASCULAR PERFUSION USING ICG  (indocyanine green ) -IMMUNOFLUORESCENCE -TRANSVERSUS ABDOMINIS PLANE (TAP) BLOCK - BILATERAL    SURGEON:  Eddye Goodie, MD  OR FINDINGS:  Patient had dense adhesions anterior abdominal wall.  Largest hernia was in the right lower quadrant around the ileal conduit urostomy.  16 x 12 cm region.  Incarcerated with over a foot of small bowel.  Midline next largest 9 x 8 cm incarcerated with numerous loops of small bowel.  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  Another direct space hernia in the left lower quadrant and indirect hernia on the left lower quadrant.  No obvious hernias on the right side.   Component separation's done on both sides to try and get the largest midline and parastomal hernias close down with running serrated try to fix absorbable suture.  Broad underlay repair with 30 x 35 cm mesh transverse    Assessment [facility] Stay = 11 days) 1 Day Post-Op    Critical  Plan:  Operative reexploration for second look today.   Plan aggressive washout.    See if I can do an ileal-ileal anastomosis of the bowel that was left in discontinuity. and then bring up a diverting loop ileostomy proximal to that.    If does not look viable and bring up end ileostomy and regroup.    See if I can use a large Phasix mesh to try and bridge his numerous hernias to avoid recurrent incarceration/strangulation which is high risk in this patient.    Discussed again the patient's status to the patient's wife at the bedside and also her son who was on the phone during the entire conversation as well.  Updated critical care as well.  Prognosis is  still very guarded but wish to remain aggressive.  Questions answered.  Patient's wife appreciates care and wishes to stay aggressive for now.  Discussed with the OR team about finding time tomorrow afternoon to get this done  The anatomy & physiology of the digestive tract was discussed.  The pathophysiology of perforation was  discussed.  Differential diagnosis such as perforated ulcer or colon, etc was discussed.   Natural history risks without surgery such as death was discussed.  I recommended abdominal exploration to diagnose & treat the source of the problem.  Laparoscopic & open techniques were discussed.   Risks such as bleeding, infection, abscess, leak, reoperation, bowel resection, possible ostomy, injury to other organs, need for repair of tissues / organs, hernia, heart attack, death, and other risks were discussed.   The risks of no intervention will lead to serious problems including death.   I expressed a good likelihood that surgery will address the problem.    Goals of post-operative recovery were discussed as well.  We will work to minimize complications although risks in an emergent setting are high.   Questions were answered.  The patient expressed understanding & wishes to proceed with surgery.      Pressure support.  Levophed .  Volume as needed.  Antibiotics.  Agree with using piperacillin /tazobactam for now.  Would add antifungal since its prolonged and with delayed bowel perforation for better coverage.  Micafungin.  Discussed with Dr. Diania Fortes with pulmonary critical care who is thinking the same.  Help appreciated.  Vent support.  Minimal settings a guarded sign that he is not fully is going into worsening respiratory failure.  Worsening creatinine but not quite oliguric.  Give volume to help resuscitate.  I think he is losing a lot of fluid through his ABThera wound VAC as a peritoneal dialysis situation.  Follow ileal conduit.  Will try and do our due diligence to avoid any injury to the ileal conduit  since redo/reconstruction would be very complicated.  Follow closely.  NG tube for persistent ileus  TPN to avoid malnutrition.  CT of chest negative for any obvious pulmonary embolism or pneumonia.  Oxygen needs less.  Echocardiogram shows grade 1 diastolic dysfunction which I believe is his  baseline.  Defer to critical care/internal medicine they feel further is needed aside for some monitor diuresis.  ABLA on top of anemia of chronic disease improved with transfusion.  Follow.  CCM wanting him back on heparin  drip to avoid any clotting events since his history of PE  -monitor electrolytes & replace as needed.  Keep K>4, Mg>2, Phos>3.  Hypokalemia not surprising given diuresis and high NG tube output.  Replete IV.  -Diabetes.  Sliding scale insulin .  Some mild hyperglycemia but nothing too severe.  Keep slightly elevated/on the sweet side to avoid severe hypoglycemic events.  -VTE prophylaxis- SCDs.  Anticoagulation prophyllaxis SQ as appropriate.  With h/o PE, CCM wished to be aggressive with hep gtt - follow Hgb & BP closely.  -  I updated the patient's status to the the patient, his wife at bedside, nurse in room.  Recommendations were made.  Questions were answered.  They expressed understanding & appreciation.  -Disposition: He is going to be here a while       I reviewed nursing notes, last 24 h vitals and pain scores, last 48 h intake and output, last 24 h labs and trends, and last 24 h imaging results.  I have reviewed this patient's available data, including  medical history, events of note, test results, etc as part of my evaluation.   A significant portion of that time was spent in counseling. Care during the described time interval was provided by me.  This care required high  level of medical decision making.  04/21/2024    Subjective: (Chief complaint)  Waiting on pressors.  Urine output fair.  Drainage through his incisional VAC.  Wife at bedside.  Critical care team just outside room  Objective:  Vital signs:  Vitals:   04/21/24 0030 04/21/24 0045 04/21/24 0100 04/21/24 0317  BP:   (!) 158/38   Pulse: 76 75 76   Resp: 18 18 18    Temp: 98.8 F (37.1 C) 98.8 F (37.1 C) 99 F (37.2 C)   TempSrc:      SpO2: 98% 98% 98% 97%  Weight:       Height:        Last BM Date : 04/17/24  Intake/Output   Yesterday:  06/09 0701 - 06/10 0700 In: 7309.4 [I.V.:5788.4; IV Piggyback:1521] Out: 2061 [Urine:450; Emesis/NG output:200; Drains:1411] This shift:  Total I/O In: 50.3 [IV Piggyback:50.3] Out: 1041 [Urine:350; Emesis/NG output:200; Drains:491]  Bowel function:  Flatus: YES  BM:  YES -small  Drains:  RLQ in hernia sac subcutaneous tissues of abdominal wall: Serosanguineous   LUQ intraperitoneal / pelvis:  Serosanguineous  ABThera incisional wound VAC with mostly serosanguineous drainage  Physical Exam:  General: Pt intubated and sedated.  Not agitated Eyes: PERRL, normal EOM.  Sclera clear.  No icterus Neuro: CN II-XII intact w/o focal sensory/motor deficits. Lymph: No head/neck/groin lymphadenopathy Psych:  No delerium/psychosis/paranoia.  Oriented x 4.  No confusion. HENT: Normocephalic, Mucus membranes moist.  No thrush.  Mumbling a little bit when he talks. Neck: Supple, No tracheal deviation.  No obvious thyromegaly Chest: No pain to chest wall compression.  Good respiratory excursion.  No audible wheezing.  Lungs clear to auscultation without wheezing, rales, nor rhonchi CV:  Pulses intact.  Regular rhythm.  No major extremity edema MS: Normal AROM mjr joints.  No obvious deformity  Abdomen: Obese but Soft.  Nondistended.  ABThera sponge in midline  Urostomy ileal conduit at RLQ with some edema but viable.  Light tan-colored urine  Ext:   No deformity.  No mjr edema.  No cyanosis Skin: No petechiae / purpurea.  No major sores.  Warm and dry    Results:   Cultures: Recent Results (from the past 720 hours)  Culture, blood (Routine X 2) w Reflex to ID Panel     Status: None   Collection Time: 04/13/24  7:13 AM   Specimen: BLOOD  Result Value Ref Range Status   Specimen Description   Final    BLOOD BLOOD LEFT ARM AEROBIC BOTTLE ONLY ANAEROBIC BOTTLE ONLY Performed at [facility],  [address] [place], [place] [address]    Special Requests   Final    BOTTLES DRAWN AEROBIC AND ANAEROBIC Blood Culture results may not be optimal due to an inadequate volume of blood received in culture bottles Performed at [facility], [address], [place], [place] [address]    Culture   Final    NO GROWTH 5 DAYS Performed at [facility], [address], [place], [place] [address]    Report Status 04/18/2024 FINAL  Final  Culture, blood (Routine X 2) w Reflex to ID Panel     Status: None   Collection Time: 04/13/24  7:20  AM   Specimen: BLOOD  Result Value Ref Range Status   Specimen Description   Final    BLOOD BLOOD RIGHT ARM AEROBIC BOTTLE ONLY ANAEROBIC BOTTLE ONLY Performed at [facility], [address], [place], [place] [address]    Special Requests   Final    BOTTLES DRAWN AEROBIC AND ANAEROBIC Blood Culture results may not be optimal due to an inadequate volume of blood received in culture bottles Performed at [facility], [address] [place], [place] [address]    Culture   Final    NO GROWTH 5 DAYS Performed at [facility], [address], [place], [place] [address]    Report Status 04/18/2024 FINAL  Final  Urine Culture     Status: None   Collection Time: 04/13/24 10:18 AM   Specimen: Urine, Clean Catch  Result Value Ref Range Status   Specimen Description   Final    URINE, CLEAN CATCH Performed at [facility], [address], [place], [place] [address]    Special Requests   Final    NONE Performed at [facility], [address], [place], [place] [address]    Culture   Final    NO GROWTH Performed at [facility], [address] [place], [place] [address]    Report Status 04/14/2024 FINAL  Final  MRSA Next Gen by PCR, Nasal     Status: None   Collection Time: 04/13/24 11:35 AM   Specimen: Nasal Mucosa; Nasal Swab  Result Value  Ref Range Status   MRSA by PCR Next Gen NOT DETECTED NOT DETECTED Final    Comment: (NOTE) The GeneXpert MRSA Assay (FDA approved for NASAL specimens only), is one component of a comprehensive MRSA colonization surveillance program. It is not intended to diagnose MRSA infection nor to guide or monitor treatment for MRSA infections. Test performance is not FDA approved in patients less than 49 years old. Performed at [facility], [address], [place], [place] [address]   Culture, blood (Routine X 2) w Reflex to ID Panel     Status: None (Preliminary result)   Collection Time: 04/20/24  8:46 AM   Specimen: BLOOD  Result Value Ref Range Status   Specimen Description   Final    BLOOD BLOOD RIGHT ARM AEROBIC BOTTLE ONLY ANAEROBIC BOTTLE ONLY Performed at [facility], [address], [place], [place] [address]    Special Requests   Final    BOTTLES DRAWN AEROBIC AND ANAEROBIC Blood Culture results may not be optimal due to an inadequate volume of blood received in culture bottles Performed at [facility], [address], [place], [place] [address]    Culture   Final    NO GROWTH < 24 HOURS Performed at [facility], [address], [place], [place] [address]    Report Status PENDING  Incomplete  Culture, blood (Routine X 2) w Reflex to ID Panel     Status: None (Preliminary result)   Collection Time: 04/20/24  8:49 AM   Specimen: BLOOD  Result Value Ref Range Status   Specimen Description   Final    BLOOD AEROBIC BOTTLE ONLY ANAEROBIC BOTTLE ONLY Performed at [facility], [address] [place], [place] [address]    Special Requests   Final    BOTTLES DRAWN AEROBIC AND ANAEROBIC Blood Culture results may not be optimal due to  an inadequate volume of blood received in culture bottles Performed at [facility], [address], [place], [place] [address]    Culture   Final     NO GROWTH < 24 HOURS Performed at [facility], [address] [place], [place] [address]    Report Status PENDING  Incomplete  Culture, Respiratory w Gram Stain     Status: None (Preliminary result)   Collection Time: 04/20/24 12:03 PM   Specimen: Tracheal Aspirate; Respiratory  Result Value Ref Range Status   Specimen Description   Final    TRACHEAL ASPIRATE Performed at [facility], [address] [place], [place] [address]    Special Requests   Final    NONE Performed at [facility], [address] [place], [place] [address]    Gram Stain   Final    NO WBC SEEN RARE GRAM POSITIVE COCCI Performed at [facility], [address], [place], [place] [address]    Culture PENDING  Incomplete   Report Status PENDING  Incomplete    Labs: Results for orders placed or performed during the hospital encounter of 04/10/24 (from the past 48 hours)  Glucose, capillary     Status: Abnormal   Collection Time: 04/19/24 12:12 PM  Result Value Ref Range   Glucose-Capillary 150 (H) 70 - 99 mg/dL    Comment: Glucose reference range applies only to samples taken after fasting for at least 8 hours.  Glucose, capillary     Status: Abnormal   Collection Time: 04/19/24  6:29 PM  Result Value Ref Range   Glucose-Capillary 135 (H) 70 - 99 mg/dL    Comment: Glucose reference range applies only to samples taken after fasting for at least 8 hours.  Blood gas, arterial     Status: Abnormal   Collection Time: 04/19/24 10:14 PM  Result Value Ref Range   FIO2 21 %   pH, Arterial 7.47 (H) 7.35 - 7.45   pCO2 arterial 29 (L) 32 - 48 mmHg   pO2, Arterial 88 83 - 108 mmHg   Bicarbonate 20.9 20.0 - 28.0 mmol/L   Acid-base deficit 1.4 0.0 - 2.0 mmol/L   O2 Saturation 98.9 %   Patient temperature 37.4    Collection site RIGHT RADIAL    Drawn by [id number]    Allens test (pass/fail) PASS PASS    Comment: Performed at [facility], [address], [place], [place] [address]  Prealbumin     Status: Abnormal   Collection Time: 04/19/24 10:59 PM  Result Value Ref Range   Prealbumin 12 (L) 18 - 38 mg/dL    Comment: Performed at [facility], [address], [place], [place] [address]  CBC     Status: Abnormal   Collection Time: 04/19/24 10:59 PM  Result Value Ref Range   WBC 17.7 (H) 4.0 - 10.5 K/uL   RBC 3.71 (L) 4.22 - 5.81 MIL/uL   Hemoglobin 10.4 (L) 13.0 - 17.0 g/dL   HCT 56.3 (L) 87.5 - 64.3 %   MCV 92.7 80.0 - 100.0 fL   MCH 28.0 26.0 - 34.0 pg   MCHC 30.2 30.0 - 36.0 g/dL   RDW 32.9 (H) 51.8 - 84.1 %   Platelets 355 150 - 400 K/uL   nRBC 0.2 0.0 - 0.2 %    Comment: Performed at [facility], [address], [place], [place] [address]  Basic metabolic  panel     Status: Abnormal   Collection Time: 04/19/24 10:59 PM  Result Value Ref Range   Sodium 149 (H) 135 - 145 mmol/L   Potassium 4.5 3.5 - 5.1 mmol/L   Chloride 122 (H) 98 - 111 mmol/L   CO2 21 (L) 22 - 32 mmol/L   Glucose, Bld 180 (H) 70 - 99 mg/dL    Comment: Glucose reference range applies only to samples taken after fasting for at least 8 hours.   BUN 45 (H) 8 - 23 mg/dL   Creatinine, Ser 4.09 0.61 - 1.24 mg/dL   Calcium  8.6 (L) 8.9 - 10.3 mg/dL   GFR, Estimated >81 >19 mL/min    Comment: (NOTE) Calculated using the CKD-EPI Creatinine Equation (2021)    Anion gap 6 5 - 15    Comment: Performed at [facility], [address] [place], [place] [address]  Type and screen [facility]     Status: None   Collection Time: 04/19/24 11:13 PM  Result Value Ref Range   ABO/RH(D) AB POS    Antibody Screen NEG    Sample Expiration      04/22/2024,2359 Performed at [facility], [address], [place], [place] [address]   I-STAT 7, (LYTES, BLD GAS, ICA, H+H)     Status: Abnormal   Collection Time: 04/20/24  1:34 AM  Result Value Ref Range   pH, Arterial 7.288 (L) 7.35 -  7.45   pCO2 arterial 39.0 32 - 48 mmHg   pO2, Arterial 102 83 - 108 mmHg   Bicarbonate 18.7 (L) 20.0 - 28.0 mmol/L   TCO2 20 (L) 22 - 32 mmol/L   O2 Saturation 97 %   Acid-base deficit 7.0 (H) 0.0 - 2.0 mmol/L   Sodium 151 (H) 135 - 145 mmol/L   Potassium 5.3 (H) 3.5 - 5.1 mmol/L   Calcium , Ion 1.22 1.15 - 1.40 mmol/L   HCT 26.0 (L) 39.0 - 52.0 %   Hemoglobin 8.8 (L) 13.0 - 17.0 g/dL   Sample type ARTERIAL   Glucose, capillary     Status: Abnormal   Collection Time: 04/20/24  2:53 AM  Result Value Ref Range   Glucose-Capillary 222 (H) 70 - 99 mg/dL    Comment: Glucose reference range applies only to samples taken after fasting for at least 8 hours.  Comprehensive metabolic panel     Status: Abnormal   Collection Time: 04/20/24  2:54 AM  Result Value Ref Range   Sodium 140 135 - 145 mmol/L   Potassium 4.8 3.5 - 5.1 mmol/L   Chloride 115 (H) 98 - 111 mmol/L   CO2 13 (L) 22 - 32 mmol/L   Glucose, Bld 154 (H) 70 - 99 mg/dL    Comment: Glucose reference range applies only to samples taken after fasting for at least 8 hours.   BUN 35 (H) 8 - 23 mg/dL   Creatinine, Ser 3.08 0.61 - 1.24 mg/dL   Calcium  7.0 (L) 8.9 - 10.3 mg/dL   Total Protein 3.4 (L) 6.5 - 8.1 g/dL   Albumin  <1.5 (L) 3.5 - 5.0 g/dL   AST 18 15 - 41 U/L   ALT 14 0 - 44 U/L   Alkaline Phosphatase 29 (L) 38 - 126 U/L   Total Bilirubin 0.9 0.0 - 1.2 mg/dL   GFR, Estimated >65 >78 mL/min    Comment: (NOTE) Calculated using the CKD-EPI Creatinine Equation (2021)    Anion gap 12 5 -  15    Comment: Performed at [facility], [address] [place], [place] [address]  Magnesium      Status: Abnormal   Collection Time: 04/20/24  2:54 AM  Result Value Ref Range   Magnesium  1.3 (L) 1.7 - 2.4 mg/dL    Comment: Performed at [facility], [address], [place], [place] [address]  Phosphorus     Status: None   Collection Time: 04/20/24  2:54 AM  Result Value Ref Range   Phosphorus 3.0  2.5 - 4.6 mg/dL    Comment: Performed at [facility], [address], [place], [place] [address]  CBC     Status: Abnormal   Collection Time: 04/20/24  2:54 AM  Result Value Ref Range   WBC 23.2 (H) 4.0 - 10.5 K/uL   RBC 3.21 (L) 4.22 - 5.81 MIL/uL   Hemoglobin 9.0 (L) 13.0 - 17.0 g/dL   HCT 91.4 (L) 78.2 - 95.6 %   MCV 94.4 80.0 - 100.0 fL   MCH 28.0 26.0 - 34.0 pg   MCHC 29.7 (L) 30.0 - 36.0 g/dL   RDW 21.3 (H) 08.6 - 57.8 %   Platelets 321 150 - 400 K/uL   nRBC 0.1 0.0 - 0.2 %    Comment: Performed at [facility], [address], [place], [place] [address]  Blood gas, arterial     Status: Abnormal   Collection Time: 04/20/24  2:56 AM  Result Value Ref Range   FIO2 40 %   Delivery systems VENTILATOR    Mode PRESSURE REGULATED VOLUME CONTROL    MECHVT 600 mL   RATE 18 resp/min   PEEP 5 cm H20   pH, Arterial 7.25 (L) 7.35 - 7.45   pCO2 arterial 45 32 - 48 mmHg   pO2, Arterial 108 83 - 108 mmHg   Bicarbonate 19.7 (L) 20.0 - 28.0 mmol/L   Acid-base deficit 7.0 (H) 0.0 - 2.0 mmol/L   O2 Saturation 100 %   Patient temperature 37.4    Collection site A-LINE    Allens test (pass/fail) PASS PASS    Comment: Performed at [facility], [address], [place], [place] [address]  Glucose, capillary     Status: Abnormal   Collection Time: 04/20/24  4:37 AM  Result Value Ref Range   Glucose-Capillary 222 (H) 70 - 99 mg/dL    Comment: Glucose reference range applies only to samples taken after fasting for at least 8 hours.  Blood gas, arterial     Status: Abnormal   Collection Time: 04/20/24  5:15 AM  Result Value Ref Range   FIO2 40 %   Delivery systems VENTILATOR    Mode PRESSURE REGULATED VOLUME CONTROL    MECHVT 1,600 mL   RATE 18 resp/min   PEEP 8 cm H20   pH, Arterial 7.33 (L) 7.35 - 7.45   pCO2 arterial 43 32 - 48 mmHg   pO2, Arterial 115 (H) 83 - 108 mmHg   Bicarbonate 22.7 20.0 - 28.0 mmol/L   Acid-base  deficit 2.9 (H) 0.0 - 2.0 mmol/L   O2 Saturation 99.6 %   Patient temperature 37.7    Collection site A-LINE    Allens test (pass/fail) PASS PASS    Comment: Performed at [facility], [address] [place], [place] [address]  Glucose, capillary     Status: Abnormal   Collection Time: 04/20/24  7:57 AM  Result Value Ref  Range   Glucose-Capillary 285 (H) 70 - 99 mg/dL    Comment: Glucose reference range applies only to samples taken after fasting for at least 8 hours.  Basic metabolic panel     Status: Abnormal   Collection Time: 04/20/24  8:46 AM  Result Value Ref Range   Sodium 142 135 - 145 mmol/L   Potassium 5.6 (H) 3.5 - 5.1 mmol/L   Chloride 112 (H) 98 - 111 mmol/L   CO2 20 (L) 22 - 32 mmol/L   Glucose, Bld 307 (H) 70 - 99 mg/dL    Comment: Glucose reference range applies only to samples taken after fasting for at least 8 hours.   BUN 51 (H) 8 - 23 mg/dL   Creatinine, Ser 1.61 (H) 0.61 - 1.24 mg/dL   Calcium  7.2 (L) 8.9 - 10.3 mg/dL   GFR, Estimated 50 (L) >60 mL/min    Comment: (NOTE) Calculated using the CKD-EPI Creatinine Equation (2021)    Anion gap 10 5 - 15    Comment: Performed at [facility], [address] [place], [place] [address]  CBC     Status: Abnormal   Collection Time: 04/20/24  8:46 AM  Result Value Ref Range   WBC 32.4 (H) 4.0 - 10.5 K/uL   RBC 3.18 (L) 4.22 - 5.81 MIL/uL   Hemoglobin 9.5 (L) 13.0 - 17.0 g/dL   HCT 54.0 (L) 98.1 - 19.1 %   MCV 97.5 80.0 - 100.0 fL   MCH 29.9 26.0 - 34.0 pg   MCHC 30.6 30.0 - 36.0 g/dL   RDW 47.8 (H) 29.5 - 62.1 %   Platelets 381 150 - 400 K/uL   nRBC 0.1 0.0 - 0.2 %    Comment: Performed at [facility], [address] [place], [place] [address]  Culture, blood (Routine X 2) w Reflex to ID Panel     Status: None (Preliminary result)   Collection Time: 04/20/24  8:46 AM   Specimen: BLOOD  Result Value Ref Range   Specimen Description      BLOOD BLOOD  RIGHT ARM AEROBIC BOTTLE ONLY ANAEROBIC BOTTLE ONLY Performed at [facility], [address], [place], [place] [address]    Special Requests      BOTTLES DRAWN AEROBIC AND ANAEROBIC Blood Culture results may not be optimal due to an inadequate volume of blood received in culture bottles Performed at [facility], [address], [place], [place] [address]    Culture      NO GROWTH < 24 HOURS Performed at [facility], [address], [place], [place] [address]    Report Status PENDING   Culture, blood (Routine X 2) w Reflex to ID Panel     Status: None (Preliminary result)   Collection Time: 04/20/24  8:49 AM   Specimen: BLOOD  Result Value Ref Range   Specimen Description      BLOOD AEROBIC BOTTLE ONLY ANAEROBIC BOTTLE ONLY Performed at [facility], [address] [place], [place] [address]    Special Requests      BOTTLES DRAWN AEROBIC AND ANAEROBIC Blood Culture results may not be optimal due to an inadequate volume of blood received in culture bottles Performed at [facility], [address], [place], [place] [address]    Culture      NO GROWTH < 24 HOURS Performed at [facility], [address], [place], [place] [address]  Report Status PENDING   Glucose, capillary     Status: Abnormal   Collection Time: 04/20/24 11:36 AM  Result Value Ref Range   Glucose-Capillary 264 (H) 70 - 99 mg/dL    Comment: Glucose reference range applies only to samples taken after fasting for at least 8 hours.  Culture, Respiratory w Gram Stain     Status: None (Preliminary result)   Collection Time: 04/20/24 12:03 PM   Specimen: Tracheal Aspirate; Respiratory  Result Value Ref Range   Specimen Description      TRACHEAL ASPIRATE Performed at [facility], [address] [place], [place] [address]    Special Requests      NONE Performed at [facility], [address] [place], [place] [address]    Gram Stain      NO WBC SEEN RARE GRAM POSITIVE COCCI Performed at [facility], [address], [place], [place] [address]    Culture PENDING    Report Status PENDING   Lactic acid, plasma     Status: None   Collection Time: 04/20/24 12:15 PM  Result Value Ref Range   Lactic Acid, Venous 1.6 0.5 - 1.9 mmol/L    Comment: Performed at [facility], [name], [place], [place] [address]  Blood gas, arterial     Status: Abnormal   Collection Time: 04/20/24  3:01 PM  Result Value Ref Range   FIO2 40 %   Delivery systems VENTILATOR    Mode PRESSURE REGULATED VOLUME CONTROL    MECHVT 600 mL   RATE 18 resp/min   PEEP 5 cm H20   pH, Arterial 7.29 (L) 7.35 - 7.45   pCO2 arterial 50 (H) 32 - 48 mmHg   pO2, Arterial 125 (H) 83 - 108 mmHg   Bicarbonate 24.0 20.0 - 28.0 mmol/L   Acid-base deficit 3.1 (H) 0.0 - 2.0 mmol/L   O2 Saturation 100 %   Patient temperature 36.9    Collection site ARTERIAL DRAW    Drawn by [id number]     Comment: Performed at [facility], [address] [place], [place] [address]  Glucose, capillary     Status: Abnormal   Collection Time: 04/20/24  3:37 PM  Result Value Ref Range   Glucose-Capillary 152 (H) 70 - 99 mg/dL    Comment: Glucose reference range applies only to samples taken after fasting for at least 8 hours.  Cooxemetry Panel (carboxy, met, total hgb, O2 sat)     Status: Abnormal   Collection Time: 04/20/24  5:01 PM  Result Value Ref Range   Total hemoglobin 8.3 (L) 12.0 - 16.0 g/dL   O2 Saturation 13.0 %   Carboxyhemoglobin 2.6 (H) 0.5 - 1.5 %   Methemoglobin 2.0 (H) 0.0 - 1.5 %    Comment: Performed at [facility], [address], [place], [place] [address]  Lactic acid, plasma     Status: None   Collection Time: 04/20/24  6:20 PM  Result Value Ref Range   Lactic Acid, Venous 1.3 0.5 - 1.9 mmol/L    Comment: Performed at [facility], [address], [place], [place] [address]  Basic metabolic panel     Status: Abnormal   Collection Time: 04/20/24  6:21 PM  Result Value Ref Range   Sodium 145 135 - 145 mmol/L   Potassium 5.6 (H) 3.5 - 5.1 mmol/L   Chloride 114 (H) 98 - 111  mmol/L   CO2 23 22 - 32 mmol/L   Glucose, Bld 197 (H) 70 - 99 mg/dL    Comment: Glucose reference range applies only to samples taken after fasting for at least 8 hours.   BUN 57 (H) 8 - 23 mg/dL   Creatinine, Ser 9.14 (H) 0.61 - 1.24 mg/dL   Calcium  7.0 (L) 8.9 - 10.3 mg/dL   GFR, Estimated 34 (L) >60 mL/min    Comment: (NOTE) Calculated using the CKD-EPI Creatinine Equation (2021)    Anion gap 8 5 - 15    Comment: Performed at [facility], [address], [place], [place] [address]  Cortisol     Status: None   Collection Time: 04/20/24  6:21 PM  Result Value Ref Range   Cortisol, Plasma 13.6 ug/dL    Comment: (NOTE) AM    6.7 - 22.6 ug/dL PM   <62.1       ug/dL Performed at [facility], [address] [place], [place] [address]   Glucose, capillary     Status: Abnormal   Collection Time: 04/20/24  8:01 PM  Result Value Ref Range   Glucose-Capillary 177 (H) 70 - 99 mg/dL    Comment: Glucose reference range applies only to samples taken after fasting for at least 8 hours.  Glucose, capillary     Status: Abnormal   Collection Time: 04/20/24 11:31 PM  Result Value Ref Range   Glucose-Capillary 251 (H) 70 - 99 mg/dL    Comment: Glucose reference range applies only to samples taken after fasting for at least 8 hours.  Heparin  level (unfractionated)     Status: None   Collection Time: 04/20/24 11:45 PM  Result Value Ref Range   Heparin  Unfractionated 0.34 0.30 - 0.70 IU/mL    Comment: (NOTE) The clinical reportable range upper limit is being lowered to >1.10 to align with the FDA approved guidance for the current laboratory assay.  If heparin  results are below expected values, and  patient dosage has  been confirmed, suggest follow up testing of antithrombin III levels. Performed at [facility], [address], [place], [place] [address]   Glucose, capillary     Status: Abnormal   Collection Time: 04/21/24  3:41 AM  Result Value Ref Range   Glucose-Capillary 236 (H) 70 - 99 mg/dL    Comment: Glucose reference range applies only to samples taken after fasting for at least 8 hours.  CBC     Status: Abnormal   Collection Time: 04/21/24  3:42 AM  Result Value Ref Range   WBC 20.2 (H) 4.0 - 10.5 K/uL   RBC 2.83 (L) 4.22 - 5.81 MIL/uL   Hemoglobin 8.0 (L) 13.0 - 17.0 g/dL   HCT 62.9 (L) 52.8 - 41.3 %   MCV 95.4 80.0 - 100.0 fL   MCH 28.3 26.0 - 34.0 pg   MCHC 29.6 (L) 30.0 - 36.0 g/dL   RDW 24.4 (H) 01.0 - 27.2 %   Platelets 239 150 - 400 K/uL   nRBC 0.1 0.0 - 0.2 %    Comment: Performed at [facility], [address], [place], [place] [address]  Triglycerides     Status: None   Collection Time: 04/21/24  3:42 AM  Result Value Ref Range   Triglycerides 64 <150 mg/dL    Comment: Performed at [facility], [address], [place], [place] [address]  Basic metabolic panel     Status:  Abnormal   Collection Time: 04/21/24  3:42 AM  Result Value Ref Range   Sodium 139 135 - 145 mmol/L   Potassium 5.2 (H) 3.5 - 5.1 mmol/L   Chloride 107 98 - 111 mmol/L   CO2 22 22 - 32 mmol/L   Glucose, Bld 259 (H) 70 - 99 mg/dL    Comment: Glucose reference range applies only to samples taken after fasting for at least 8 hours.   BUN 62 (H) 8 - 23 mg/dL   Creatinine, Ser 8.41 (H) 0.61 - 1.24 mg/dL   Calcium  6.3 (LL) 8.9 - 10.3 mg/dL    Comment: CRITICAL RESULT CALLED TO, READ BACK BY AND VERIFIED WITH HAWKINS, E. RN AT [phone] ON 6.10/25. FA    GFR, Estimated 27 (L) >60 mL/min    Comment: (NOTE) Calculated using the CKD-EPI Creatinine Equation (2021)    Anion gap 10 5 - 15    Comment: Performed at [facility], [address] [place], [place] [address]    Imaging / Studies: DG CHEST PORT 1 VIEW Result Date: 04/20/2024 CLINICAL DATA:  Check endotracheal tube placement EXAM: PORTABLE CHEST 1 VIEW COMPARISON:  Film from the previous day. FINDINGS: Tracheal tube is noted 3.6 cm above the carina. Right PICC is seen with the catheter tip at the cavoatrial junction stable from the prior exam. Gastric catheter extends into the stomach. Overall inspiratory effort is poor although no focal confluent infiltrate is seen. IMPRESSION: Tubes and lines as described.  No acute abnormality noted. Electronically Signed   By: Violeta Grey M.D.   On: 04/20/2024 03:26   DG Chest Port 1 View Result Date: 04/19/2024 CLINICAL DATA:  Respiratory distress EXAM: PORTABLE CHEST 1 VIEW COMPARISON:  04/15/2024 FINDINGS: Right PICC tip in the right atrium. Subdiaphragmatic enteric tube. Stable cardiomediastinal silhouette. Bibasilar atelectasis. Otherwise no focal consolidation. No pleural effusion or pneumothorax. No displaced rib fractures. IMPRESSION: Bibasilar atelectasis. Electronically Signed   By: Rozell Cornet M.D.   On: 04/19/2024 22:49   CT ABDOMEN PELVIS WO CONTRAST Result Date: 04/19/2024 CLINICAL DATA:  Postoperative abdominal pain. EXAM: CT ABDOMEN AND PELVIS WITHOUT CONTRAST TECHNIQUE: Multidetector CT imaging of the abdomen and pelvis was performed following the standard protocol without IV contrast. RADIATION DOSE REDUCTION: This exam was performed according to the departmental dose-optimization program which includes automated exposure control, adjustment of the mA and/or kV according to patient size and/or use of iterative reconstruction technique. COMPARISON:  CT abdomen pelvis dated 04/14/2024. FINDINGS: Evaluation of this exam is limited in the absence of intravenous contrast. Lower chest: Trace right pleural effusion. There are bibasilar subsegmental atelectasis. There is coronary vascular calcification.  Partially visualized tip of central venous line at the cavoatrial junction. Small pockets of pneumoperitoneum.  No significant free fluid. Hepatobiliary: The liver is grossly unremarkable. No biliary dilatation. Cholecystectomy. Pancreas: Scattered pancreatic calcification sequela of chronic pancreatitis. No active inflammatory changes. Spleen: Normal in size without focal abnormality. Adrenals/Urinary Tract: The adrenal glands unremarkable. Similar appearance of moderate right and severe left hydronephrosis with moderate left renal parenchyma atrophy. There is bilateral hydroureter. Postsurgical changes of cystectomy with a right lower quadrant ileal conduit. No stone. Stomach/Bowel: Enteric tube with tip in the body of the stomach. Multiple dilated and contrast filled small bowel loops measure up to approximately 5 cm in the left lower abdomen. No discrete transition noted. Findings favor to represent reactive ileus. Contrast noted in the colon. There is sigmoid diverticulosis. The appendix is not visualized with  certainty. No inflammatory changes identified in the right lower quadrant. Vascular/Lymphatic: Mild aortoiliac atherosclerotic disease. The IVC is unremarkable. No portal venous gas. There is no adenopathy. Reproductive: Prostatectomy. Other: Postsurgical changes of anterior abdominal wall. There is air-fluid collection in the subcutaneous soft tissues of the right anterior pelvic wall extending from the midline to the ileostomy measuring approximately 5 x 12 cm. A drainage catheter noted within this collection. Additional drainage catheter noted in the pelvis. Musculoskeletal: Degenerative changes of the spine. No acute osseous pathology. IMPRESSION: 1. Postsurgical changes of cystectomy with a right lower quadrant ileal conduit. 2. Air-fluid collection in the subcutaneous soft tissues of the right anterior pelvic wall extending from the midline to the ileostomy. A drainage catheter noted within this  collection. 3. Multiple dilated and contrast filled small bowel loops favor to represent reactive ileus. 4. Sigmoid diverticulosis. 5. Similar appearance of moderate right and severe left hydronephrosis with moderate left renal parenchyma atrophy. 6. Additional postoperative changes and related small pneumoperitoneum similar to prior CT. 7.  Aortic Atherosclerosis (ICD10-I70.0). Electronically Signed   By: Angus Bark M.D.   On: 04/19/2024 12:09    Medications / Allergies: per chart  Antibiotics: Anti-infectives (From admission, onward)    Start     Dose/Rate Route Frequency Ordered Stop   04/20/24 1400  piperacillin -tazobactam (ZOSYN ) IVPB 3.375 g        3.375 g 12.5 mL/hr over 240 Minutes Intravenous Every 8 hours 04/20/24 0755     04/20/24 1000  metroNIDAZOLE  (FLAGYL ) IVPB 500 mg  Status:  Discontinued        500 mg 100 mL/hr over 60 Minutes Intravenous Every 12 hours 04/20/24 0320 04/20/24 0752   04/20/24 1000  micafungin (MYCAMINE) 150 mg in sodium chloride  0.9 % 100 mL IVPB        150 mg 107.5 mL/hr over 1 Hours Intravenous Every 24 hours 04/20/24 0752     04/20/24 0245  ceFEPIme  (MAXIPIME ) 2 g in sodium chloride  0.9 % 100 mL IVPB  Status:  Discontinued        2 g 200 mL/hr over 30 Minutes Intravenous Every 8 hours 04/20/24 0232 04/20/24 0752   04/20/24 0100  metroNIDAZOLE  (FLAGYL ) IVPB 500 mg        500 mg 100 mL/hr over 60 Minutes Intravenous On call to O.R. 04/20/24 0004 04/20/24 0100   04/14/24 1800  ceFEPIme  (MAXIPIME ) 2 g in sodium chloride  0.9 % 100 mL IVPB        2 g 200 mL/hr over 30 Minutes Intravenous Every 8 hours 04/14/24 1003 04/19/24 0957   04/14/24 1600  vancomycin  (VANCOCIN ) IVPB 1000 mg/200 mL premix  Status:  Discontinued        1,000 mg 200 mL/hr over 60 Minutes Intravenous Every 24 hours 04/13/24 1420 04/14/24 1000   04/14/24 1600  vancomycin  (VANCOREADY) IVPB 1500 mg/300 mL  Status:  Discontinued        1,500 mg 150 mL/hr over 120 Minutes Intravenous  Every 24 hours 04/14/24 1002 04/16/24 0744   04/14/24 1200  vancomycin  (VANCOCIN ) IVPB 1000 mg/200 mL premix  Status:  Discontinued        1,000 mg 200 mL/hr over 60 Minutes Intravenous Every 24 hours 04/13/24 1053 04/13/24 1109   04/13/24 2200  ceFEPIme  (MAXIPIME ) 2 g in sodium chloride  0.9 % 100 mL IVPB  Status:  Discontinued        2 g 200 mL/hr over 30 Minutes Intravenous Every 12 hours 04/13/24 1036 04/13/24 1109  04/13/24 2200  ceFEPIme  (MAXIPIME ) 2 g in sodium chloride  0.9 % 100 mL IVPB  Status:  Discontinued        2 g 200 mL/hr over 30 Minutes Intravenous Every 12 hours 04/13/24 1425 04/14/24 1003   04/13/24 1500  vancomycin  (VANCOCIN ) 2,000 mg in sodium chloride  0.9 % 500 mL IVPB        2,000 mg 260 mL/hr over 120 Minutes Intravenous  Once 04/13/24 1409 04/13/24 1850   04/13/24 1500  ceFEPIme  (MAXIPIME ) 2 g in sodium chloride  0.9 % 100 mL IVPB  Status:  Discontinued        2 g 200 mL/hr over 30 Minutes Intravenous Every 12 hours 04/13/24 1420 04/13/24 1425   04/13/24 1500  metroNIDAZOLE  (FLAGYL ) IVPB 500 mg  Status:  Discontinued        500 mg 100 mL/hr over 60 Minutes Intravenous Every 12 hours 04/13/24 1420 04/17/24 0725   04/13/24 1200  vancomycin  (VANCOCIN ) 2,000 mg in sodium chloride  0.9 % 500 mL IVPB  Status:  Discontinued        2,000 mg 260 mL/hr over 120 Minutes Intravenous  Once 04/13/24 1052 04/13/24 1121   04/13/24 1100  metroNIDAZOLE  (FLAGYL ) IVPB 500 mg  Status:  Discontinued        500 mg 100 mL/hr over 60 Minutes Intravenous 2 times daily 04/13/24 1007 04/13/24 1109   04/13/24 1100  vancomycin  (VANCOCIN ) IVPB 1000 mg/200 mL premix  Status:  Discontinued        1,000 mg 200 mL/hr over 60 Minutes Intravenous  Once 04/13/24 1007 04/13/24 1020   04/13/24 1015  ceFEPIme  (MAXIPIME ) 2 g in sodium chloride  0.9 % 100 mL IVPB        2 g 200 mL/hr over 30 Minutes Intravenous STAT 04/13/24 1007 04/14/24 1721   04/10/24 2200  ceFAZolin  (ANCEF ) IVPB 2g/100 mL premix         2 g 200 mL/hr over 30 Minutes Intravenous Every 8 hours 04/10/24 1833 04/11/24 0531   04/10/24 0600  ceFAZolin  (ANCEF ) IVPB 2g/100 mL premix        2 g 200 mL/hr over 30 Minutes Intravenous On call to O.R. 04/10/24 0533 04/10/24 1526         Note: Portions of this report may have been transcribed using voice recognition software. Every effort was made to ensure accuracy; however, inadvertent computerized transcription errors may be present.   Any transcriptional errors that result from this process are unintentional.    Eddye Goodie, MD, FACS, MASCRS Esophageal, Gastrointestinal & Colorectal Surgery Robotic and Minimally Invasive Surgery  [facility] [address], [address] [place], [place] [address] [phone] Fax [phone] Main  CONTACT INFORMATION: Weekday (9AM-5PM): Call [facility] main office at [phone] Weeknight (5PM-9AM) or Weekend/Holiday: Check EPIC "Web Links" tab & use "AMION" (password " [email or web address]") for General Surgery [facility] coverage  Please, DO NOT use SecureChat  (it is not reliable communication to reach operating surgeons & will lead to a delay in care).   Epic staff messaging available for outptient concerns needing 1-2 business day response.      04/21/2024  6:58 AM

## 2024-04-21 NOTE — Progress Notes (Addendum)
 PT brought back from OR with anesthesia bagging pt.  Pt placed back on vent with same vent settings.  Vital signs stable. RN at bedside

## 2024-04-21 NOTE — Anesthesia Postprocedure Evaluation (Signed)
 Anesthesia Post Note  Patient: Kristopher Thompson  Procedure(s) Performed: EXPLORATORY LAPAROTOMY (Abdomen) SMALL BOWEL RESECTION (Abdomen) PLACEMENT OF ABTHERA VAC (Abdomen)     Patient location during evaluation: SICU Anesthesia Type: General Level of consciousness: sedated Pain management: pain level controlled Vital Signs Assessment: post-procedure vital signs reviewed and stable Respiratory status: patient remains intubated per anesthesia plan Cardiovascular status: stable Postop Assessment: no apparent nausea or vomiting Anesthetic complications: no   No notable events documented.  Last Vitals:  Vitals:   04/21/24 0100 04/21/24 0317  BP: (!) 158/38   Pulse: 76   Resp: 18   Temp: 37.2 C   SpO2: 98% 97%    Last Pain:  Vitals:   04/20/24 0408  TempSrc: Esophageal  PainSc:                  Valente Gaskin Zonnique Norkus

## 2024-04-21 NOTE — Anesthesia Procedure Notes (Signed)
 Date/Time: 04/21/2024 1:32 PM  Performed by: Raylene Calamity, CRNAPre-anesthesia Checklist: Patient identified, Emergency Drugs available, Suction available, Patient being monitored and Timeout performed Patient Re-evaluated:Patient Re-evaluated prior to induction Oxygen Delivery Method: Circle system utilized Induction Type: Inhalational induction with existing ETT

## 2024-04-21 NOTE — Progress Notes (Signed)
 eLink Physician-Brief Progress Note Patient Name: Kristopher Thompson DOB: 09-30-1951 MRN: [id number]   Date of Service  04/21/2024  HPI/Events of Note  Notified of Calcium  6.3 Albumin  was < 1.2 hence corrected calcium  likely > 8.5  On further review cortisol 13.6 drawn at 6.21 pm  eICU Interventions  No need for calcium  correction Random cortisol normal. Will defer to bedside team steroids deemed clinically indicated     Intervention Category Intermediate Interventions: Electrolyte abnormality - evaluation and management;Diagnostic test evaluation  [name] 04/21/2024, 5:16 AM

## 2024-04-21 NOTE — Progress Notes (Addendum)
 PHARMACY - TOTAL PARENTERAL NUTRITION CONSULT NOTE   Indication: Prolonged ileus  Patient Measurements: Height: 5\' 11"  (180.3 cm) Weight: 112.1 kg (247 lb 2.2 oz) IBW/kg (Calculated) : 75.3 TPN AdjBW (KG): 83.7 Body mass index is 34.47 kg/m.  Assessment: Pt is a 81 yoM who underwent hernia repair and urostomy revision on 04/10/24. Pt developed post-op ileus. Pharmacy consulted to manage TPN, which was started on 6/2. Patient required return to OR early 6/9 am for perforated bowel; he is s/p drainage of abscesses, small bowel resection (in discontinuity) with placement of wound VAC. He returned to the ICU intubated and requiring vasopressor support. He remained on 3 vasopressors overnight, stress dose steroids added early this morning. Phenylephrine  currently off. Plan for return to OR today.  Glucose / Insulin : Hx DM on metformin PTA. A1c 5.3% -CBGs previously reasonably controlled; now elevated > 200 -Semglee 10u given 6/9 -40 units of SSI used in last 24hrs -Stress dose steroids started 6/10 am Electrolytes:  -Na improved -K 5.2 - elevated, trending down some -CorrCa slightly low ~ 8.3 (although difficult to fully determine given albumin  not an actual number < 1.5) Renal: SCr 2.47 (worsening); BUN 62 (trending up)  Hepatic: albumin  < 1.5; LFTs, alk phos WNL Intake / Output; MIVF:  -UOP only 400 ml documented yesterday, 200 ml so far this am -NG/emesis 170 ml overnight -Drain ~ 1700 ml from yesterday am to this am -Stool last charted on 6/6 GI Imaging: -6/9: Air-fluid collection in the subcutaneous soft tissues of the right anterior pelvic wall extending from the midline to the ileostomy -6/3: Right lower quadrant small bowel loop with mural defect and adjacent extraluminal gas identified, which is concerning for underlying small bowel perforation. GI Surgeries / Procedures:  -6/9: Perforated bowel - drainage of right abdominal wall abscess as well as intra-abdominal abscess,  explantation of abdominal mesh, small bowel resection (in discontinuity), placement of wound VAC -5/30: Hernia repair, lysis of adhesions, urostomy revision  Central access: PICC TPN start date: 6/2   Nutritional Goals:  Goal TPN rate is 90 mL/hr (provides 101.5 g of protein and 2047 kcals per day)  RD Assessment: 6/6 Estimated Needs Total Energy Estimated Needs: 1950-2150 Total Protein Estimated Needs: 95-110g Total Fluid Estimated Needs: 2.1L/day  Current Nutrition:  TPN  NPO  Plan: Calcium  gluconate 1 g IV x 1 per CCM Continue TPN at goal 90 mL/hr Remove lipids as pt is receiving ~ 576 kcal lipids from propofol  at current rate Electrolytes in TPN:  Na 50 mEq/L (increased) K 0 mEq/L  Ca 5 mEq/L (increased) Mg 4 mEq/L   Phos 15 mmol/L (decreased in setting of worsening SCr) Cl:Ac 1:1 Add standard MVI and trace elements to TPN Increase SSI to resistant scale, q4hr frequency and add Semglee 10 units daily per discussion with CCM Monitor TPN labs on Mon/Thurs at minimum. Will check all electrolytes tomorrow morning given worsening SCr and ongoing electrolyte imbalances requiring adjustments to TPN    Lolita Rise, PharmD, BCPS Clinical Pharmacist 04/21/2024 8:34 AM

## 2024-04-21 NOTE — Progress Notes (Signed)
 Nutrition Follow-up  DOCUMENTATION CODES:      INTERVENTION:  - Continue goal TPN.             - TPN management per pharmacy.    - Daily weights while on TPN.    NUTRITION DIAGNOSIS:   Increased nutrient needs related to acute illness as evidenced by estimated needs. *ongoing  GOAL:   Patient will meet greater than or equal to 90% of their needs *met with TPN  MONITOR:    (TPN)  REASON FOR ASSESSMENT:   Consult New TPN/TNA  ASSESSMENT:   73 y.o male patient with history of Parastomal hernia of ileal conduit. On 5/30 underwent hernia repair with urostomy revision.  5/30 Admit; s/p lysis of ahdesions, repair of incisional, parastomal, left inguinal incarcerated abdominal wall hernias, urostomy ileal conduit revision; CLD 5/31 FLD 6/1 Heart healthy diet 6/2 NPO; NGT placed for suction; TPN initiated 6/4 TPN increased to goal; NGT removed by patient; CLD then back to NPO after emesis; NGT replaced 6/7 SLP eval, cleared for a DYS 2 diet 6/8 Rapid response for increased lethargy 6/9 OR in the early AM for ex-lap, drainage of right abdominal wall abscess, explantation fo abdominal mesh, small bowel resection (left In discontinuity), AbThera wound VAC placement; returned to ICU intubated   Patient is currently intubated on ventilator support MV: 10.2 L/min Temp (24hrs), Avg:98.2 F (36.8 C), Min:95.7 F (35.4 C), Max:99.7 F (37.6 C)  Being taken back to OR today for reexploration with aggressive washout. Planning for ileal-ileal anastomosis.  Patient waned off Neo this AM but remains on Levophed  and Vasopressin  TPN remains at 87mL/hr, providing 101g protein and 2047 kcals.  Will increase estimated needs as patient now with further surgery requiring need for healing and with wound VAC with significant output ( out over the past 24 hours).  Per discussion with CCM NP, possible need for nephrology consult for consideration of CRRT.     Admit weight:  240# Current weight: 247# (as of 6/9, was 219# on 6/8 ?) I&Os: +9.4L since admit + for mid pitting generalized edema   Medications reviewed and include: Colace, Miralax ,  Fentanyl  Levophed  @ 17 mcg/min Vasopressin @ 0.04 units/min Propofol  @ 23.81mL/hr (provides 631 kcals over 24 hours)   Labs reviewed:  K+ 5.2 Creatinine 2.47 Triglycerides 64 (as of 6/10)   Diet Order:   Diet Order             Diet NPO time specified Except for: Ice Chips  Diet effective now                   EDUCATION NEEDS:  Not appropriate for education at this time  Skin:  Skin Assessment: Skin Integrity Issues: Skin Integrity Issues:: Incisions Incisions: 5/30 abdomen  Last BM:  6/6  Height:  Ht Readings from Last 1 Encounters:  04/20/24 5\' 11"  (1.803 m)   Weight:  Wt Readings from Last 1 Encounters:  04/20/24 112.1 kg   Ideal Body Weight: 78.18 kg  BMI:  Body mass index is 34.47 kg/m.  Estimated Nutritional Needs:  Kcal:  2100-2300 kcals ([employer]: 2179 kcals) Protein:  115-140 grams Fluid:  >/= 2.1L    Scheryl Cushing RD, LDN Contact via Secure Chat.

## 2024-04-21 NOTE — Progress Notes (Addendum)
 eLink Physician-Brief Progress Note Patient Name: Kristopher Thompson DOB: 05-06-1951 MRN: [id number]   Date of Service  04/21/2024  HPI/Events of Note  Corrected calcium  8.1, K+ 5.8, Cr 3.13  eICU Interventions  Calcium  gluconate 2 gm iv x 1, Trend serum K+. TPN potassium likely needs downward adjustment.        Erminia Mcnew U Corin Tilly 04/21/2024, 8:48 PM

## 2024-04-21 NOTE — Anesthesia Postprocedure Evaluation (Signed)
 Anesthesia Post Note  Patient: GRANVEL PROUDFOOT  Procedure(s) Performed: COLECTOMY, PARTIAL     Patient location during evaluation: SICU Anesthesia Type: General Level of consciousness: sedated Pain management: pain level controlled Vital Signs Assessment: post-procedure vital signs reviewed and stable Respiratory status: patient remains intubated per anesthesia plan Cardiovascular status: stable Postop Assessment: no apparent nausea or vomiting Anesthetic complications: no  No notable events documented.  Last Vitals:  Vitals:   04/21/24 1145 04/21/24 1645  BP:    Pulse: (!) 58 68  Resp: (!) 22 20  Temp: 36.9 C (!) 36 C  SpO2: 98% 95%    Last Pain:  Vitals:   04/21/24 0800  TempSrc: Esophageal  PainSc:                  Rosalita Combe

## 2024-04-21 NOTE — Op Note (Signed)
 04/21/2024 3:55 PM  PATIENT:  Kristopher Thompson  73 y.o. male  Patient Care Team: Dudley Ghee, MD as PCP - General (Internal Medicine) Devon Fogo, MD (Inactive) as Consulting Physician (Dermatology) Lavina Pou, MD as Referring Physician (Pulmonary Disease) Candyce Champagne, MD as Consulting Physician (General Surgery) Florencio Hunting, MD as Consulting Physician (Urology)  PRE-OPERATIVE DIAGNOSIS:  DELAYED BOWEL PERFORATION WITH OPEN ABDOMEN  POST-OPERATIVE DIAGNOSIS:  DELAYED BOWEL PERFORATION WITH OPEN ABDOMEN  PROCEDURE:   LYSIS OF ADHESIONS X ABDOMINAL WALL DEBRIDEMENT ILEAL RESECTION END ILEOSTOMY ABDOMINAL WALL PARASTOMAL & INCISIONAL HERNIA REPAIRS WITH PHASIX MESH FASCIA CLOSURE WITH WOUND VAC PLACEMENT IN SQ  SURGEON:  Eddye Goodie, MD  ASSISTANT:  [name], MD  An experienced assistant was required given the standard of surgical care given the complexity of the case.  This assistant was needed for exposure, dissection, suction, tissue approximation, retraction, perception, etc  ANESTHESIA:  General endotracheal intubation anesthesia (GETA)  Estimated Blood Loss (EBL):   Total I/O In: 6056.8 [I.V.:4798.5; IV Piggyback:1258.3] Out: 330 [Urine:170; Drains:160].   (See anesthesia record)  Delay start of Pharmacological VTE agent (>24hrs) due to concerns of significant anemia, surgical blood loss, or risk of bleeding?:  no  Ostomy:  Right upper quadrant: End ileostomy Right lower quadrant: Urostomy ileal conduit  Drains: 19 Fr Blake drains x2  Right lower quadrant drain rests in the right panniculus abdominal wall between the Phasix mesh and abdominal wall  Left lower quadrant drain rest in the anterior peritoneum between the mesh and the bowel with the tip resting in the right lower quadrant near the base of the ileal conduit and prior bowel resection  SPECIMEN:   ILEUM for Pathology ABDOMINAL WALL for culture  DISPOSITION OF SPECIMEN:   Pathology  COUNTS:  Sponge, needle, & instrument counts CORRECT at the conclusion of the case.      PLAN OF CARE: Admit to inpatient   PATIENT DISPOSITION:  ICU - intubated and critically ill.  INDICATION: Pleasant morbidly obese male with history of cystectomy and diverting colostomy for bladder cancer requiring ileal conduit reconstruction.  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  After lengthy consultation due to proximal adhesions with reduction, bilateral component separation, underlay mesh reinforcement and Sugarbaker repair of parastomal as well as incisional hernias.  Patient did well but then had evidence of hypotension that responded to blood transfusion.  Placed on antibiotics.  Improved at 124.  2 days ago abdominal drains became bilious and patient felt worse.  [place] the operating room.  Delayed bowel perforation noted.  Ileum resection done 15 cm proximal to the ileocecal valve and left in discontinuity with open VAC.  Recommendation made for a second look operation for possible resection and attempt at closure diverting ostomy.  The anatomy & physiology of the digestive tract was discussed.  The pathophysiology of perforation was discussed.  Differential diagnosis such as perforated ulcer or colon, etc was discussed.   Natural history risks without surgery such as death was discussed.  I recommended abdominal exploration to diagnose & treat the source of the problem.  Laparoscopic & open techniques were discussed.   Risks such as bleeding, infection, abscess, leak, reoperation, bowel resection, possible ostomy, injury to other organs, need for repair of tissues / organs, hernia, heart attack, death, and other risks were discussed.   The risks of no intervention will lead to serious problems including death.   I expressed  a good likelihood that surgery will address the problem.    Goals of post-operative  recovery were discussed as well.  We will work to minimize complications although risks in an emergent setting are high.   Questions were answered.  The patient's wife & son expressed understanding & wishes to proceed with surgery.      OR FINDINGS: Bilious staining tissue necrosis in right lower quadrant.  Primary parastomal fascia repair fallen apart.  Urostomy viable.  Distal end of ileum viable with silk suture 10 cm from ileocecal valve viable but rather fixed to the retroperitoneum in discontinuity near the donor site of ileal conduit.  Proximal end of the ileal resection somewhat ischemic.  Mobilization and Fili resection done.  Mobilization to create end ileostomy in right upper quadrant.  Too inflamed thickened and fixed to attempt ileal anastomosis and diverting loop ileostomy.  End ileostomy at rest in the right upper quadrant.  The other end of the very terminal ileum rests in the right gutter.  Ileal conduit urostomy rests in the right lower quadrant  Underlying intraperitoneal dual sided Phasix mesh 35 x 30 cm sheets x 2 placed intraperitoneally.  Both laying obliquely especially covering the right flank and parastomal hernia ileal conduit.  Mesh also laid and covering the end ileostomy to have Sugarbaker coverage of new and old ostomies.  Obliquely in the right flank and left flank overlapping across midline supraumbilically.  .  Overlapping across midline.  Primary closure incorporating Phasix mesh in the midline.  Subcutaneous tissues packed with wound VAC black sponge  CASE DATA:  Type of patient?: Elective [facility] Private Case  Status of Case? URGENT Add On  Infection Present At Time Of Surgery (PATOS)?  INFECTION  DESCRIPTION: Informed consent was confirmed.   The patient received IV antibiotics and underwent general anesthesia without any difficulty. The patient was positioned appropriately. Foley catheter had been sterilely placed. SCDs were active during the entire case.  The  abdomen was prepped and draped in a sterile fashion.  Surgical timeout confirmed our plan.    Remove the ABThera wound VAC.  Small bowel had fixed soft dense adhesions like a brain mold.  Good careful focus blunt and finger fracture dissection to eventually free of fluids.  Could identify in the right lower quadrant areas of resection  consistent with the distal ileal resection left in discontinuity.  Gradually mobilized the bowel of the anterior abdominal wall and flank.  Mobilized the ileocecal region and ascending colon a lateral medial fashion down the final distal end.  Worked to debride some necrotic and ischemic tissues primarily in the anterior abdominal wall around the parastomal hernia closure with sharp scissor dissection until it healthier tissue.  Sent some tissue for culture to rule out atypical or fungal organisms..  Muscle and abdominal wall from component separation viable.  Copious irrigation.  Followed the mesentery to find the more proximal resection end of the ileum.  Mesentery rather fixed the retroperitoneum and connected to the ileal conduit mesentery.  Carefully freed off interloop adhesions of ileum and then came to the proximal end staple line.    Very shortened fixed mesentery to the retroperitoneum.  Not amenable to brought up.  Somewhat ischemic in the distal 8 cm.  And therefore transected the mesentery of the ileum more proximally until I found a much more mobile area that could reach up into the right upper quadrant.  Did that with a LigaSure staying close to the ileum to preserve as much  ileal mesentery as possible to avoid any ischemia to the ileal conduit which which appeared viable.  Transected the bowel with a 75 GIA.  It was apparent that it would be not be safe to bring ileum to ileum and bring up a diverting ostomy.  Worried about risk of ischemia and leak since this it happened under more ideal conditions.  Therefore decided to bring an end ileostomy.  Made a 2 cm  diameter circular incision in the right upper quadrant along the midclavicular line.  Excised a cylinder of subcutaneous fat until it came to the anterior rectus fascia.  Came through that transversely.  Brought up the end ileostomy.  Distal end was somewhat ischemia so brought up until it healthy mesentery.  Inspected the bowel & colon and saw no serosal injury or other leak or other concern.  Urostomy viable in place.  Confirmed NG tube in the stomach and good placement.  No other surprises elsewhere.    Not much mesentery left to bring down but did mobilize the transverse colon down somewhat to cover the midline below the least the upper 50%.  We have decided to try and bridge and close the abdomen.  Used in 35 x 30 cm dual slot Phasix mesh.  Late one is an oblique diamond running along the right paracolic gutter.  I made a slight opening to go around in Akil fashion around the ileal conduit.  The medial inferior corner cover the suprapubic region and prior cystectomy well.  It helped Sugarbaker the ileal conduit.  I used #1 PDS on the edges of the corners and brought that out the right flank using a laparoscopic suture passer to have the mesh laid well.  However it did not cover well cross midline or cover the operator.  I opened the second sheet of mesh that also like a diamond with some overlap of the left flank lateral to the old paracolostomy repair which was intact.  Upper corner the epigastric region with over 5 cm overlap from the upper aspect incision.  Right superior corner as a Manufacturing systems engineer of the lesion and urostomy.  Secured that with the warm PDS at the edges using laparoscopic suture passer to good result.  Next we freed the subcutaneous tissues off the midline.  Come together with minimal tension.  I primarily closed the midline using #1 PDS in an interrupted running fashion.  Took bites of the Phasix mesh in the midline to help centrally tacked as well taking care to avoid  injury to the bowel.  Irrigation of midline.  I did a few stitches down to bring the edges of the midline wound together and placed a wound VAC.  Urostomy appliance in place.   Focused on maturing the end ileostomy.  Used interrupted 2-0 Vicryl sutures at the fascia to the ileostomy and tail to close down.  Home and then transected the ileostomy 6 cm distally.  Do not PATOS (present at time of surgery) and not very viable.  Resected a few centimeters more proximally and got some bleeding mucosa..  And then interrupted for a 1 cm Brooke ileostomy using interrupted 2-0 and 3-0 Vicryl sutures to the end of the ileum and the dermis circumferentially.  Did finger intubation.  Ileostomy.  Patient did not have increasing pressor support.  Did not transfusion.  Return to the ICU intubated on pressors in critical but stabilizing condition.  We will start trophic tube feeds and see if wound VAC is adequate versus  dressing changes.  Continue IV antibiotics.  I discussed operative findings, updated the patient's status, discussed probable steps to recovery, and gave postoperative recommendations to the patient's spouse, Desiderio Dolata.Family with her as well.  Recommendations were made.  Questions were answered.  They expressed understanding & appreciation. I updated the CCM ICU MD, NP & RNs as well.  Updated surgical hospitalist, Dr Dorrie Gaudier, who assisted during the case.       Eddye Goodie, M.D., F.A.C.S. Gastrointestinal and Minimally Invasive Surgery [facility] [address], [address] [place], [place] [address] [phone] Main / Paging

## 2024-04-21 NOTE — Progress Notes (Signed)
 NAME:  Kristopher Thompson, MRN:  [id number], DOB:  04-03-1951, LOS: 11 ADMISSION DATE:  04/10/2024, CONSULTATION DATE:  04/13/2024 REFERRING MD:  Dr Bonita Bussing, CHIEF COMPLAINT: Sepsis  History of Present Illness:  S/p repair of incisional/parastomal and ventral hernias, left inguinal incarcerated , abdominal hernias, lysis of adhesions, urostomy revision Day 1 postoperatively  Transferred to the ICU secondary to tachycardia, tachypnea  History of bladder cancer, prostate cancer, diastolic heart failure, diabetes, right bundle branch block, multiple abdominal surgeries  Update 04/20/24: ccm reconsulted post operatively after pt had perforated bowel and returned to OR. Pt is requiring neo and norepi at this time. He remains intubated at this time. Profoundly acidotic with bicarb of 12. Will start sodium bicarb infusion and bolus prior to initiation. Follow labs in am. Updated wife and son at bedside.   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   moderate atypia - right low back   Atypical nevus 04/04/2007   moderate to marked - right upper back (wider shave)   Atypical nevus 04/04/2007   moderate atypia - center chest (wider shave)   Atypical nevus 04/04/2007   slight atypia - right thigh   Atypical nevus 11/29/2011   mild atypia - center upper back   Atypical nevus 11/29/2011   mild atypia - center chest   Bacteremia due to Klebsiella pneumoniae 10/09/2017   Bladder cancer (HCC) dx 07/2017   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  s/p  cystectomy w/ ileal conduit urinary diversion   Candida infection    CHF (congestive heart failure) (HCC)    Colostomy in place (HCC)    since 08-08-2017-- per pt 12-25-2017 reddness around stoma   Diabetes mellitus without complication (HCC)    GERD (gastroesophageal reflux disease)    H/O hiatal hernia    History of  sepsis 09/2017   dx bacteremia due to klebsiella pneumoniae,  post op intraabdominal abscess   Prostate cancer [facility]) urologist-- dr Rozanne Corners   10-02-2012 s/p  prostatectomy-- Stage T1c   RBBB    Renal disorder    pt. denies   Sleep apnea    cpap   Squamous cell carcinoma of skin 05/22/2013   left cheek - CX3 + 5FU   Wears glasses    Significant Hospital Events: Including procedures, antibiotic start and stop dates in addition to other pertinent events   04/10/2024-laparoscopic surgery 04/13/24 CCM consult. Incr NE. Art line  6/3 weaning NE. Gently diuresed w 3L off  6/4 off NE. Pulled out his NGT refused replacement. After multiple emesis  6/5 cont NGT 6/6 tx to med surg 6/9: taken back to OR for perforated bowel, post-op shock 6/10: Norepinephrine , Phenylephrine , and vasopressin, added steroids, 1u PRBCs, back to OR for washout and closure  Interim History / Subjective:  Heavily sedated    Objective    Blood pressure (!) 158/38, pulse 76, temperature 99 F (37.2 C), resp. rate 18, height 5\' 11"  (1.803 m), weight 112.1 kg, SpO2 97%. CVP:  [9 mmHg-21 mmHg] 21 mmHg  Vent Mode: PRVC FiO2 (%):  [40 %] 40 % Set Rate:  [18 bmp] 18 bmp Vt Set:  [60 mL-600 mL] 600 mL PEEP:  [5 cmH20] 5 cmH20 Plateau Pressure:  [9 cmH20-17 cmH20] 17 cmH20   Intake/Output Summary (Last 24 hours) at 04/21/2024 0725 Last  data filed at 04/21/2024 0710 Gross per 24 hour  Intake 9060.66 ml  Output 2061 ml  Net 6999.66 ml   Filed Weights   04/18/24 0521 04/19/24 0500 04/20/24 0500  Weight: 99.5 kg 99.5 kg 112.1 kg    Examination: General: Acutely ill appearing, sedated and intubated Neuro: heavily sedated, pupils reactive to light HEENT: normocephalic, eye are non-icteric, orally intubated, NGT in place with green output Resp: RRR, clear in all CV: RRR, +1 pitting edema in bilateral upper and lower extremities, palpable pulses throughout GI: present but hypoactive bowel sounds present, abdominal  wound vac covering incisional site, abdomen soft GU: urostomy in place with yellow straw colored urine Skin: warm and dry, abdominal site covered with wound vac, JP drains in place in bilateral aspects of abdomen    Resolved problem list    Assessment and Plan   Septic shock due to acute bowel perforation S/P OR for bowel repair left in discontinuity with plans to return to OR 6/10 Likely caused by delayed ileal leak  Incarcerated abd wall hernia a/p LOA hernia repairs urostomy ileal conduit revision  plan - Abdominal drains per surgery: wound vac and 2x JP drains - MAP goal >65 - Levophed , vasopressin, able to ween off Phenylephrine  - Started stress dose steroids; cortisol 13  - Zosyn , Micafungin - Blood cultures pending - Sedation and pain management: propofol , fentanyl   - TPN  - 1L LR per surgery - trend CBC  AKI w/ Severe Non-anion gap Metabolic Acidosis; and hyperkalemia prob was element of Anion gap as well at one time now improving/ lactate has not been elevated since a week ago plan -? GI loss. Placed on bicarb. Plan Cont bicarb til after surgery  Repeat chem and abg post op Although bicarb improved, renal fxn and K remain worrisome. May end up needing early CRRT  Acute Hypoxemic Respiratory Failure; intubated Hx OSA on CPAP  plan - Continue mechanical ventilatory support - vap prevention - keep sedated while in discontinuity - ABG post op - RASS goal -4 while in discontinuity -   Hyperglycemia Hx of diabetes Plan Resistant dosing for SSI Long acting and short acting dosing Low threshold for insulin  drip given acutely ill state   Anemia: acute blood loss and chronic anemia R flank hematoma  Hx PE on eliquis  plan - heparin  drip paused for OR 6/10, plan to restart post op - monitor for bleeding - give 1u PRBCs; hemoglobin 8 - repeat CBC  H/o CHF  ECHO with EF 65-70% plan Trend CVP Monitor I/O balance   Moderate Protein Calorie  Malnutrition Hypoalbuminemia plan - continue TPN   Best Practice (right click and "Reselect all SmartList Selections" daily)   Diet/type: NPO on tpn DVT prophylaxis SCD heparin  to restart postop  Pressure ulcer(s): N/A GI prophylaxis: N/A Lines: Central line,PICC art line  Foley:  N/A Code Status:  full code Last date of multidisciplinary goals of care discussion [pending per primary team]     Critical care time: 45 min

## 2024-04-21 NOTE — Progress Notes (Signed)
 PHARMACY - ANTICOAGULATION CONSULT NOTE  Pharmacy Consult for IV heparin  Indication: pulmonary embolus  Allergies  Allergen Reactions   Demerol  [Meperidine ] Nausea And Vomiting and Other (See Comments)    SEVERE NAUSEA    Patient Measurements: Height: 5\' 11"  (180.3 cm) Weight: 112.1 kg (247 lb 2.2 oz) IBW/kg (Calculated) : 75.3 HEPARIN  DW (KG): 98.5  Vital Signs: Temp: 98.6 F (37 C) (06/10 0000) BP: 151/30 (06/10 0000) Pulse Rate: 75 (06/10 0000)  Labs: Recent Labs    04/18/24 2251 04/19/24 0508 04/19/24 2259 04/20/24 0134 04/20/24 0254 04/20/24 0846 04/20/24 1821 04/20/24 2345  HGB  --  9.6* 10.4* 8.8* 9.0* 9.5*  --   --   HCT  --  31.0* 34.4* 26.0* 30.3* 31.0*  --   --   PLT  --  297 355  --  321 381  --   --   HEPARINUNFRC 0.41 0.39  --   --   --   --   --  0.34  CREATININE  --  1.21 1.10  --  0.84 1.49* 2.03*  --     Estimated Creatinine Clearance: 42.5 mL/min (A) (by C-G formula based on SCr of 2.03 mg/dL (H)).   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   moderate atypia - right low back   Atypical nevus 04/04/2007   moderate to marked - right upper back (wider shave)   Atypical nevus 04/04/2007   moderate atypia - center chest (wider shave)   Atypical nevus 04/04/2007   slight atypia - right thigh   Atypical nevus 11/29/2011   mild atypia - center upper back   Atypical nevus 11/29/2011   mild atypia - center chest   Bacteremia due to Klebsiella pneumoniae 10/09/2017   Bladder cancer (HCC) dx 07/2017   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  s/p  cystectomy w/ ileal conduit urinary diversion   Candida infection    CHF (congestive heart failure) (HCC)    Colostomy in place (HCC)    since 08-08-2017-- per pt 12-25-2017 reddness around stoma   Diabetes mellitus without complication (HCC)    GERD (gastroesophageal reflux  disease)    H/O hiatal hernia    History of sepsis 09/2017   dx bacteremia due to klebsiella pneumoniae,  post op intraabdominal abscess   Prostate cancer [facility]) urologist-- dr Rozanne Corners   10-02-2012 s/p  prostatectomy-- Stage T1c   RBBB    Renal disorder    pt. denies   Sleep apnea    cpap   Squamous cell carcinoma of skin 05/22/2013   left cheek - CX3 + 5FU   Wears glasses     Medications: Apixaban PTA, LD 5/27  Assessment: Pharmacy is consulted to dose heparin  on 04/17/24 for 73 yo male with PMH that includes PE. On apixaban PTA, which has not been resumed as inpatient. Was receiving enoxaparin  for DVT ppx post-op.  Per [place] discussion with CCM, will avoid boluses due to concerns with ABLA and transfusions during admission.  Today, 04/21/24 Heparin  level = 0.34 therapeutic on heparin  infusion of 2150 units/hr Per RN no bleeding other than sanguinous drainage from belly and JP  Goal of Therapy:  No bolus  Heparin  level 0.3-0.7 units/ml Monitor platelets by anticoagulation protocol: Yes   Plan:  Continue heparin  infusion at 2150 units/hr Stop heparin  at 0600 on  6/10 F/u resuming AC Daily CBC, HL  Monitor for signs and symptoms of bleeding  Beau Bound RPh 04/21/2024, 12:14 AM

## 2024-04-21 NOTE — Progress Notes (Signed)
 PHARMACY - ANTICOAGULATION CONSULT NOTE  Pharmacy Consult for IV heparin  Indication: pulmonary embolus  Allergies  Allergen Reactions   Demerol  [Meperidine ] Nausea And Vomiting and Other (See Comments)    SEVERE NAUSEA    Patient Measurements: Height: 5\' 11"  (180.3 cm) Weight: 112.1 kg (247 lb 2.2 oz) IBW/kg (Calculated) : 75.3 HEPARIN  DW (KG): 98.5  Vital Signs: Temp: 98.8 F (37.1 C) (06/10 0945) Temp Source: Esophageal (06/10 0800) BP: 171/49 (06/10 0800) Pulse Rate: 71 (06/10 0945)  Labs: Recent Labs    04/18/24 2251 04/19/24 0508 04/19/24 2259 04/20/24 0254 04/20/24 0846 04/20/24 1821 04/20/24 2345 04/21/24 0342  HGB  --  9.6*   < > 9.0* 9.5*  --   --  8.0*  HCT  --  31.0*   < > 30.3* 31.0*  --   --  27.0*  PLT  --  297   < > 321 381  --   --  239  HEPARINUNFRC 0.41 0.39  --   --   --   --  0.34  --   CREATININE  --  1.21   < > 0.84 1.49* 2.03*  --  2.47*   < > = values in this interval not displayed.    Estimated Creatinine Clearance: 34.9 mL/min (A) (by C-G formula based on SCr of 2.47 mg/dL (H)).   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   moderate atypia - right low back   Atypical nevus 04/04/2007   moderate to marked - right upper back (wider shave)   Atypical nevus 04/04/2007   moderate atypia - center chest (wider shave)   Atypical nevus 04/04/2007   slight atypia - right thigh   Atypical nevus 11/29/2011   mild atypia - center upper back   Atypical nevus 11/29/2011   mild atypia - center chest   Bacteremia due to Klebsiella pneumoniae 10/09/2017   Bladder cancer (HCC) dx 07/2017   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  s/p  cystectomy w/ ileal conduit urinary diversion   Candida infection    CHF (congestive heart failure) (HCC)    Colostomy in place (HCC)    since 08-08-2017-- per pt 12-25-2017 reddness around  stoma   Diabetes mellitus without complication (HCC)    GERD (gastroesophageal reflux disease)    H/O hiatal hernia    History of sepsis 09/2017   dx bacteremia due to klebsiella pneumoniae,  post op intraabdominal abscess   Prostate [facility]) urologist-- dr Rozanne Corners   10-02-2012 s/p  prostatectomy-- Stage T1c   RBBB    Renal disorder    pt. denies   Sleep apnea    cpap   Squamous cell carcinoma of skin 05/22/2013   left cheek - CX3 + 5FU   Wears glasses     Medications: Apixaban PTA, LD 5/27  Assessment: Pharmacy is consulted to dose heparin  on 04/17/24 for 73 yo male with PMH that includes PE. On apixaban PTA, which has not been resumed as inpatient. Was receiving enoxaparin  for DVT ppx post-op.  Per [place] discussion with CCM, will avoid boluses due to concerns with ABLA and transfusions during admission.  Today, 04/21/24 - heparin  is currently off as of 0600 for return to OR  Goal of Therapy:  No bolus  Heparin  level 0.3-0.7 units/ml Monitor platelets by anticoagulation protocol: Yes  Plan:  Heparin  consult discontinued for now as patient is not actively receiving heparin  infusion Return to OR today Please re-consult pharmacy with plan for further heparin  as considered safe post operatively   Lolita Rise, PharmD, BCPS Clinical Pharmacist 04/21/2024 10:55 AM

## 2024-04-21 NOTE — Transfer of Care (Signed)
 Immediate Anesthesia Transfer of Care Note  Patient: Kristopher Thompson  Procedure(s) Performed: COLECTOMY, PARTIAL  Patient Location: ICU  Anesthesia Type:General  Level of Consciousness: sedated and Patient remains intubated per anesthesia plan  Airway & Oxygen Therapy: Patient remains intubated per anesthesia plan  Post-op Assessment: Report given to RN  Post vital signs: Reviewed and stable  Last Vitals:  Vitals Value Taken Time  BP    Temp    Pulse 72 04/21/24 1630  Resp 22 04/21/24 1630  SpO2 93 % 04/21/24 1630  Vitals shown include unfiled device data.  Last Pain:  Vitals:   04/21/24 0800  TempSrc: Esophageal  PainSc:       Patients Stated Pain Goal: 2 (04/10/24 2037)  Complications: No notable events documented.

## 2024-04-22 ENCOUNTER — Inpatient Hospital Stay (HOSPITAL_COMMUNITY)

## 2024-04-22 ENCOUNTER — Encounter (HOSPITAL_COMMUNITY): Payer: Self-pay | Admitting: Surgery

## 2024-04-22 DIAGNOSIS — N179 Acute kidney failure, unspecified: Secondary | ICD-10-CM | POA: Diagnosis not present

## 2024-04-22 DIAGNOSIS — K430 Incisional hernia with obstruction, without gangrene: Secondary | ICD-10-CM | POA: Diagnosis not present

## 2024-04-22 DIAGNOSIS — A419 Sepsis, unspecified organism: Secondary | ICD-10-CM | POA: Diagnosis not present

## 2024-04-22 DIAGNOSIS — R6521 Severe sepsis with septic shock: Secondary | ICD-10-CM | POA: Diagnosis not present

## 2024-04-22 LAB — BPAM RBC
Blood Product Expiration Date: 202507062359
ISSUE DATE / TIME: 202506101821
Unit Type and Rh: 6200

## 2024-04-22 LAB — RENAL FUNCTION PANEL
Albumin: 1.8 g/dL — ABNORMAL LOW (ref 3.5–5.0)
Anion gap: 14 (ref 5–15)
BUN: 79 mg/dL — ABNORMAL HIGH (ref 8–23)
CO2: 20 mmol/L — ABNORMAL LOW (ref 22–32)
Calcium: 6.5 mg/dL — ABNORMAL LOW (ref 8.9–10.3)
Chloride: 100 mmol/L (ref 98–111)
Creatinine, Ser: 3.64 mg/dL — ABNORMAL HIGH (ref 0.61–1.24)
GFR, Estimated: 17 mL/min — ABNORMAL LOW (ref >60)
Glucose, Bld: 279 mg/dL — ABNORMAL HIGH (ref 70–99)
Phosphorus: 7.9 mg/dL — ABNORMAL HIGH (ref 2.5–4.6)
Potassium: 4.2 mmol/L (ref 3.5–5.1)
Sodium: 134 mmol/L — ABNORMAL LOW (ref 135–145)

## 2024-04-22 LAB — BASIC METABOLIC PANEL WITH GFR
Anion gap: 12 (ref 5–15)
BUN: 80 mg/dL — ABNORMAL HIGH (ref 8–23)
CO2: 21 mmol/L — ABNORMAL LOW (ref 22–32)
Calcium: 6.2 mg/dL — CL (ref 8.9–10.3)
Chloride: 103 mmol/L (ref 98–111)
Creatinine, Ser: 3.7 mg/dL — ABNORMAL HIGH (ref 0.61–1.24)
GFR, Estimated: 17 mL/min — ABNORMAL LOW (ref >60)
Glucose, Bld: 287 mg/dL — ABNORMAL HIGH (ref 70–99)
Potassium: 5.1 mmol/L (ref 3.5–5.1)
Sodium: 136 mmol/L (ref 135–145)

## 2024-04-22 LAB — CBC
HCT: 27.2 % — ABNORMAL LOW (ref 39.0–52.0)
Hemoglobin: 8.8 g/dL — ABNORMAL LOW (ref 13.0–17.0)
MCH: 29 pg (ref 26.0–34.0)
MCHC: 32.4 g/dL (ref 30.0–36.0)
MCV: 89.8 fL (ref 80.0–100.0)
Platelets: 283 10*3/uL (ref 150–400)
RBC: 3.03 MIL/uL — ABNORMAL LOW (ref 4.22–5.81)
RDW: 15.8 % — ABNORMAL HIGH (ref 11.5–15.5)
WBC: 27.6 10*3/uL — ABNORMAL HIGH (ref 4.0–10.5)
nRBC: 0.3 % — ABNORMAL HIGH (ref 0.0–0.2)

## 2024-04-22 LAB — TYPE AND SCREEN
ABO/RH(D): AB POS
Antibody Screen: NEGATIVE
Unit division: 0

## 2024-04-22 LAB — GLUCOSE, CAPILLARY
Glucose-Capillary: 219 mg/dL — ABNORMAL HIGH (ref 70–99)
Glucose-Capillary: 230 mg/dL — ABNORMAL HIGH (ref 70–99)
Glucose-Capillary: 231 mg/dL — ABNORMAL HIGH (ref 70–99)
Glucose-Capillary: 242 mg/dL — ABNORMAL HIGH (ref 70–99)
Glucose-Capillary: 245 mg/dL — ABNORMAL HIGH (ref 70–99)
Glucose-Capillary: 248 mg/dL — ABNORMAL HIGH (ref 70–99)

## 2024-04-22 LAB — HEMOGLOBIN AND HEMATOCRIT, BLOOD
HCT: 27.6 % — ABNORMAL LOW (ref 39.0–52.0)
Hemoglobin: 8.8 g/dL — ABNORMAL LOW (ref 13.0–17.0)

## 2024-04-22 LAB — MAGNESIUM: Magnesium: 2.3 mg/dL (ref 1.7–2.4)

## 2024-04-22 LAB — PHOSPHORUS: Phosphorus: 8.5 mg/dL — ABNORMAL HIGH (ref 2.5–4.6)

## 2024-04-22 MED ORDER — CALCIUM GLUCONATE-NACL 1-0.675 GM/50ML-% IV SOLN
1.0000 g | Freq: Once | INTRAVENOUS | Status: AC
  Administered 2024-04-22: 1000 mg via INTRAVENOUS
  Filled 2024-04-22: qty 50

## 2024-04-22 MED ORDER — PRISMASOL BGK 4/2.5 32-4-2.5 MEQ/L EC SOLN: Status: DC

## 2024-04-22 MED ORDER — FENTANYL 2500MCG IN NS 250ML (10MCG/ML) PREMIX INFUSION
0.0000 ug/h | INTRAVENOUS | Status: DC
  Administered 2024-04-22: 150 ug/h via INTRAVENOUS
  Filled 2024-04-22: qty 250

## 2024-04-22 MED ORDER — INSULIN GLARGINE-YFGN 100 UNIT/ML ~~LOC~~ SOLN
10.0000 [IU] | Freq: Once | SUBCUTANEOUS | Status: AC
  Administered 2024-04-22: 10 [IU] via SUBCUTANEOUS
  Filled 2024-04-22: qty 0.1

## 2024-04-22 MED ORDER — DEXMEDETOMIDINE HCL IN NACL 200 MCG/50ML IV SOLN
0.0000 ug/kg/h | INTRAVENOUS | Status: DC
  Administered 2024-04-22 (×2): 0.5 ug/kg/h via INTRAVENOUS
  Administered 2024-04-22: 0.4 ug/kg/h via INTRAVENOUS
  Administered 2024-04-23 (×4): 0.6 ug/kg/h via INTRAVENOUS
  Filled 2024-04-22 (×7): qty 50

## 2024-04-22 MED ORDER — PROPOFOL 1000 MG/100ML IV EMUL
0.0000 ug/kg/min | INTRAVENOUS | Status: DC
  Administered 2024-04-22 (×2): 30 ug/kg/min via INTRAVENOUS
  Filled 2024-04-22: qty 100

## 2024-04-22 MED ORDER — FENTANYL 2500MCG IN NS 250ML (10MCG/ML) PREMIX INFUSION: 0.0000 ug/h | INTRAVENOUS | Status: DC

## 2024-04-22 MED ORDER — HEPARIN SODIUM (PORCINE) 1000 UNIT/ML DIALYSIS
1000.0000 [IU] | INTRAMUSCULAR | Status: DC | PRN
  Administered 2024-04-22: 3000 [IU] via INTRAVENOUS_CENTRAL
  Filled 2024-04-22: qty 4
  Filled 2024-04-22: qty 2
  Filled 2024-04-22: qty 4
  Filled 2024-04-22: qty 2
  Filled 2024-04-22: qty 6

## 2024-04-22 MED ORDER — CALCIUM GLUCONATE-NACL 2-0.675 GM/100ML-% IV SOLN
2.0000 g | Freq: Once | INTRAVENOUS | Status: AC
  Administered 2024-04-22: 2000 mg via INTRAVENOUS
  Filled 2024-04-22: qty 100

## 2024-04-22 MED ORDER — INSULIN ASPART 100 UNIT/ML IJ SOLN: 4.0000 [IU] | INTRAMUSCULAR | Status: DC

## 2024-04-22 MED ORDER — VASOPRESSIN 20 UNITS/100 ML INFUSION FOR SHOCK
0.0000 [IU]/min | INTRAVENOUS | Status: DC
  Administered 2024-04-22 – 2024-04-23 (×3): 0.03 [IU]/min via INTRAVENOUS
  Filled 2024-04-22 (×2): qty 100

## 2024-04-22 MED ORDER — TRAVASOL 10 % IV SOLN
INTRAVENOUS | Status: AC
  Filled 2024-04-22: qty 1252.8

## 2024-04-22 MED ORDER — SODIUM CHLORIDE 0.9 % IV SOLN
500.0000 [IU]/h | INTRAVENOUS | Status: DC
  Administered 2024-04-22 – 2024-04-23 (×2): 500 [IU]/h via INTRAVENOUS_CENTRAL
  Administered 2024-04-23 – 2024-04-24 (×3): 750 [IU]/h via INTRAVENOUS_CENTRAL
  Filled 2024-04-22: qty 2
  Filled 2024-04-22 (×4): qty 10000

## 2024-04-22 MED ORDER — INSULIN GLARGINE-YFGN 100 UNIT/ML ~~LOC~~ SOLN
30.0000 [IU] | Freq: Every day | SUBCUTANEOUS | Status: DC
  Filled 2024-04-22: qty 0.3

## 2024-04-22 MED ORDER — LACTATED RINGERS IV BOLUS
1000.0000 mL | Freq: Once | INTRAVENOUS | Status: AC
  Administered 2024-04-22: 1000 mL via INTRAVENOUS

## 2024-04-22 NOTE — Plan of Care (Signed)
  Problem: Nutrition: Goal: Adequate nutrition will be maintained Outcome: Progressing   Problem: Elimination: Goal: Will not experience complications related to bowel motility Outcome: Progressing   Problem: Coping: Goal: Ability to adjust to condition or change in health will improve Outcome: Progressing   Problem: Fluid Volume: Goal: Hemodynamic stability will improve Outcome: Progressing   Problem: Respiratory: Goal: Ability to maintain adequate ventilation will improve Outcome: Progressing   Problem: Elimination: Goal: Will not experience complications related to urinary retention Outcome: Not Progressing

## 2024-04-22 NOTE — Progress Notes (Addendum)
 04/22/2024  Kristopher Thompson [id number] [birth year]  CARE TEAM: PCP: Kristopher Ghee, MD  Outpatient Care Team: Patient Care Team: Kristopher Ghee, MD as PCP - General (Internal Medicine) Kristopher Fogo, MD (Inactive) as Consulting Physician (Dermatology) Kristopher Pou, MD as Referring Physician (Pulmonary Disease) Kristopher Champagne, MD as Consulting Physician (General Surgery) Kristopher Hunting, MD as Consulting Physician (Urology)  Inpatient Treatment Team: Treatment Team:  Kristopher Champagne, MD Kristopher Hermann., MD Kristopher Hunting, MD Kristopher Thompson, CCC-SLP Pccm, Md, MD Kristopher Thompson, RRT Kristopher Platter, RN Kristopher Hoover, RN Kristopher Thompson, RRT   Problem List:   Principal Problem:   Incarcerated incisional hernia Active Problems:   S/P ileal conduit [facility])   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 08/08/2017   GERD (gastroesophageal reflux disease)   Obesity (BMI 35.0-39.9 without comorbidity)   Prolonged QT interval   Non-recurrent bilateral inguinal hernia without obstruction or gangrene   Sinus tachycardia   Tachypnea   Acute respiratory insufficiency, postoperative   Sepsis due to undetermined organism (HCC)   Lactic acidosis   Class 2 obesity   Chronic anticoagulation   Hearing loss   History of bladder cancer   Obstructive sleep apnea of adult   Parastomal hernia of ileal conduit   Partial small bowel obstruction (HCC)   Personal history of PE (pulmonary embolism)   04/10/2024  POST-OPERATIVE DIAGNOSIS:  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   PROCEDURE:   -ROBOTIC LYSIS OF ADHESIONS X 4 HOURS -COMPONENT SEPARATION - TRANSVERSUS ABDOMINIS REALEASE (TAR) BILATERAL -ROBOTIC REPAIRS OF  INCISIONAL, PARASTOMAL, LEFT INGUINAL  INCARCERATED ABDOMINAL WALL HERNIAS WITH MESH -UROSTOMY ILEAL CONDUIT REVISION -INTRAOPERATIVE ASSESSMENT OF TISSUE VASCULAR PERFUSION USING ICG (indocyanine green )  -IMMUNOFLUORESCENCE -TRANSVERSUS ABDOMINIS PLANE (TAP) BLOCK - BILATERAL    SURGEON:  Kristopher Goodie, MD  OR FINDINGS:  Patient had dense adhesions anterior abdominal wall.  Largest hernia was in the right lower quadrant around the ileal conduit urostomy.  16 x 12 cm region.  Incarcerated with over a foot of small bowel.  Midline next largest 9 x 8 cm incarcerated with numerous loops of small bowel.  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  Another direct space hernia in the left lower quadrant and indirect hernia on the left lower quadrant.  No obvious hernias on the right side.   Component separation's done on both sides to try and get the largest midline and parastomal hernias close down with running serrated try to fix absorbable suture.  Broad underlay repair with 30 x 35 cm mesh transverse  04/20/2024 POST-OPERATIVE DIAGNOSIS:  perforated bowel; history of robotic lysis of adhesions x 4 hours, component separation-transversus abdominis release bilateral, robotic repairs of incisional, parastomal, left inguinal incarcerated abdominal wall hernias with mesh; urostomy ileal conduit revision by Dr. Hershell Thompson on May 30   PROCEDURE:  Drainage of right abdominal wall abscess as well as intra-abdominal abscess Explantation of abdominal mesh Small bowel resection (in discontinuity) Placement of ABThera wound VAC   SURGEON:  Kristopher Hummingbird, MD  ASSISTANTS: Kristopher Acton, MD   04/21/2024  POST-OPERATIVE DIAGNOSIS:  DELAYED BOWEL PERFORATION WITH OPEN ABDOMEN   PROCEDURE:   LYSIS OF ADHESIONS X ABDOMINAL WALL DEBRIDEMENT ILEAL RESECTION END ILEOSTOMY ABDOMINAL WALL PARASTOMAL & INCISIONAL HERNIA REPAIRS WITH PHASIX MESH FASCIA CLOSURE WITH WOUND VAC PLACEMENT IN SQ   SURGEON:  Kristopher Goodie, MD   ASSISTANT:  Kristopher Lightning, MD   Ostomy:  Right upper quadrant: End ileostomy Right lower quadrant: Urostomy ileal conduit   Drains: 19 Fr Blake drains x2    Right lower quadrant drain rests in the right panniculus abdominal wall between the Phasix mesh and abdominal wall   Left lower quadrant drain rest in the anterior peritoneum between the mesh and the bowel with the tip resting in the right lower quadrant near the base of the ileal conduit and prior bowel resection  Assessment Kristopher Thompson Stay = 12 days) 1 Day Post-Op    Critical  Plan:  Pressure support.  Levophed  weaning.  Vasopressin.  Volume as needed.  Antibiotics. Zosyn Kristopher Thompson 6/10-  Vent support.  Minimal settings a guarded sign that he is not fully is going into worsening respiratory failure.  Unfortunately becoming oliguric with elevated creatinine raises concern of progressing to kidney failure.  Agree with nephrology consultation.  Will try with volume support to turn things around.  I meticulously inspected the ileal conduit and appeared viable with no injury or leak there.  Most distal vulnerable hand and skin is pink and viable.  Hopefully will turn around but guarded.  Trophic tube feeds for now.  Having some bowel function guardedly hopeful.  Once off pressors advance tube feeds to goal and wean TPN.  TPN to avoid malnutrition.  CT of chest negative for any obvious pulmonary embolism or pneumonia.  Oxygen needs less.  Echocardiogram shows grade 1 diastolic dysfunction which I believe is his baseline.  Defer to critical care/internal medicine they feel further is needed aside for some monitor diuresis.  ABLA on top of anemia of chronic disease improved with transfusion.  Follow.  CCM wanting him back on heparin  drip to avoid any clotting events since his history of PE  -monitor electrolytes & replace as needed.  Keep K>4, Mg>2, Phos>3.    -Diabetes.  Sliding scale insulin .  Some hyperglycemia not surprising with TPN and trophic tube feeds.  Agree with adjusting to resistant sliding scale 6/10.  Poss insulin  drip if he persists - defer to CCM  -VTE prophylaxis-  SCDs.  Anticoagulation prophyllaxis SQ as appropriate.  Okay to restart heparin  as long as continue to follow hemoglobin to avoid pulmonary embolus with h/o PE, CCM wished to be aggressive with hep gtt - follow Hgb & BP closely.    I updated the patient's status to the the patient's wife at bedside & ICU nurse in room.  Recommendations were made.  Questions were answered.  They expressed understanding & appreciation.  -Disposition: He is going to be here a while       I reviewed nursing notes, last 24 h vitals and pain scores, last 48 h intake and output, last 24 h labs and trends, and last 24 h imaging results.  I have reviewed this patient's available data, including medical history, events of note, test results, etc as part of my evaluation.   A significant portion of that time was spent in counseling. Care during the described time interval was provided by me.  This care required high  level of medical decision making.  04/22/2024    Subjective: (Chief complaint)  Able to wean pressors a little bit  Critical care nurse in wife in room.  Poor urine output through night.  Objective:  Vital signs:  Vitals:   04/22/24 0030 04/22/24 0045 04/22/24 0100 04/22/24 0513  BP:      Pulse: 67 67 68   Resp: (!) 25 (!) 25 (!) 25   Temp: 98.8 F (  37.1 C) 98.8 F (37.1 C) 98.8 F (37.1 C)   TempSrc:      SpO2: 97% 97% 96% 95%  Weight:      Height:        Last BM Date : 04/17/24  Intake/Output   Yesterday:  06/10 0701 - 06/11 0700 In: 14022.7 [I.V.:10981.8; Blood:280; NG/GT:201.7; IV Piggyback:2559.1] Out: 1315 [Urine:170; Emesis/NG output:60; Drains:1010; Stool:50; Blood:25] This shift:  Total I/O In: 4217 [I.V.:2643; Blood:280; NG/GT:201; IV Piggyback:1093] Out: 690 [Emesis/NG output:60; Drains:580; Stool:50]  Bowel function:  Flatus: YES  BM:  YES -small  Drains:  RLQ drain (rests between abdominal wall and Phasix mesh): Serous    LLQ drain (runs over bowel  with tip down in RLQ pelvis near base of ileal conduit and site of bowel resection):  thinly serosanguinous   Wound vac (in SQ over closed fascia):  serosanguineous  Physical Exam:  General: Pt intubated and sedated.  Not agitated Eyes: PERRL, normal EOM.  Sclera clear.  No icterus Neuro: CN II-XII intact w/o focal sensory/motor deficits. Lymph: No head/neck/groin lymphadenopathy Psych:  No delerium/psychosis/paranoia.  No agitation HENT: Normocephalic, Mucus membranes moist.  No thrush.  ETT & NGT in place Neck: Supple, No tracheal deviation.  No obvious thyromegaly Chest: No pain to chest wall compression.  Good respiratory excursion.   CV:  Pulses intact.  Regular rhythm.  1-2+ BUE/BLE edema MS:  No obvious deformity  Abdomen: Obese but Soft.  Nondistended.  Wound vac in midline  RUQ: (End ileostomy): Molasses thick black effluent in bag flow volume RLQ:  (Urostomy ileal conduit): pink with scant light tan-colored urine  Ext:   No deformity.  No mjr edema.  No cyanosis Skin: No petechiae / purpurea.  No major sores.  Warm and dry    Results:   Cultures: Recent Results (from the past 720 hours)  Culture, blood (Routine X 2) w Reflex to ID Panel     Status: None   Collection Time: 04/13/24  7:13 AM   Specimen: BLOOD  Result Value Ref Range Status   Specimen Description   Final    BLOOD BLOOD LEFT ARM AEROBIC BOTTLE ONLY ANAEROBIC BOTTLE ONLY Performed at [facility], [address] [place], [place] [address]    Special Requests   Final    BOTTLES DRAWN AEROBIC AND ANAEROBIC Blood Culture results may not be optimal due to an inadequate volume of blood received in culture bottles Performed at [facility], [address], [place], [place] [address]    Culture   Final    NO GROWTH 5 DAYS Performed at [facility], [address] [place], [place] [address]    Report Status 04/18/2024 FINAL  Final  Culture, blood (Routine X 2) w  Reflex to ID Panel     Status: None   Collection Time: 04/13/24  7:20 AM   Specimen: BLOOD  Result Value Ref Range Status   Specimen Description   Final    BLOOD BLOOD RIGHT ARM AEROBIC BOTTLE ONLY ANAEROBIC BOTTLE ONLY Performed at [facility], [address], [place], [place] [address]    Special Requests   Final    BOTTLES DRAWN AEROBIC AND ANAEROBIC Blood Culture results may not be optimal due to an inadequate volume of blood received in culture bottles Performed at [facility], [address] [place], [place] [address]    Culture   Final    NO GROWTH 5 DAYS Performed  at [facility], [address], [place], [place] [address]    Report Status 04/18/2024 FINAL  Final  Urine Culture     Status: None   Collection Time: 04/13/24 10:18 AM   Specimen: Urine, Clean Catch  Result Value Ref Range Status   Specimen Description   Final    URINE, CLEAN CATCH Performed at [facility], [address] [place], [place] [address]    Special Requests   Final    NONE Performed at [facility], [address], [place], [place] [address]    Culture   Final    NO GROWTH Performed at [facility], [address], [place], [place] [address]    Report Status 04/14/2024 FINAL  Final  MRSA Next Gen by PCR, Nasal     Status: None   Collection Time: 04/13/24 11:35 AM   Specimen: Nasal Mucosa; Nasal Swab  Result Value Ref Range Status   MRSA by PCR Next Gen NOT DETECTED NOT DETECTED Final    Comment: (NOTE) The GeneXpert MRSA Assay (FDA approved for NASAL specimens only), is one component of a comprehensive MRSA colonization surveillance program. It is not intended to diagnose MRSA infection nor to guide or monitor treatment for MRSA infections. Test performance is not FDA approved in patients less than 16 years old. Performed at [facility], [address] [place], [place]  [address]   Culture, blood (Routine X 2) w Reflex to ID Panel     Status: None (Preliminary result)   Collection Time: 04/20/24  8:46 AM   Specimen: BLOOD RIGHT ARM  Result Value Ref Range Status   Specimen Description   Final    BLOOD RIGHT ARM Performed at [facility], [address], [place], [place] [address]    Special Requests   Final    BOTTLES DRAWN AEROBIC AND ANAEROBIC Blood Culture results may not be optimal due to an inadequate volume of blood received in culture bottles Performed at [facility], [address], [place], [place] [address]    Culture   Final    NO GROWTH < 24 HOURS Performed at [facility], [address] [place], [place] [address]    Report Status PENDING  Incomplete  Culture, blood (Routine X 2) w Reflex to ID Panel     Status: None (Preliminary result)   Collection Time: 04/20/24  8:49 AM   Specimen: BLOOD  Result Value Ref Range Status   Specimen Description   Final    BLOOD Performed at [facility], [address], [place], [place] [address]    Special Requests   Final    BOTTLES DRAWN AEROBIC AND ANAEROBIC Blood Culture results may not be optimal due to an inadequate volume of blood received in culture bottles Performed at [facility], [address] [place], [place] [address]    Culture   Final    NO GROWTH < 24 HOURS Performed at [facility], [address] [place], [place] [address]    Report Status PENDING  Incomplete  Culture, Respiratory w Gram Stain     Status: None (Preliminary result)   Collection Time: 04/20/24 12:03 PM   Specimen: Tracheal Aspirate; Respiratory  Result Value Ref Range Status   Specimen Description   Final    TRACHEAL ASPIRATE Performed at [facility], [address], [place], [place] [address]  Special Requests   Final    NONE Performed at [facility], [address] [place], [place] [address]     Gram Stain NO WBC SEEN RARE GRAM POSITIVE COCCI   Final   Culture   Final    CULTURE REINCUBATED FOR BETTER GROWTH Performed at [facility], [address], [place], [place] [address]    Report Status PENDING  Incomplete  Aerobic/Anaerobic Culture w Gram Stain (surgical/deep wound)     Status: None (Preliminary result)   Collection Time: 04/21/24  2:23 PM   Specimen: Soft Tissue, Other  Result Value Ref Range Status   Specimen Description   Final    TISSUE INTRA ABDOMINAL NECROTIC TISSUE Performed at [facility], [address], [place], [place] [address]    Special Requests   Final    NONE Performed at [facility], [address] [place], [place] [address]    Gram Stain   Final    FEW WBC PRESENT,BOTH PMN AND MONONUCLEAR RARE GRAM POSITIVE COCCI IN PAIRS AND CHAINS RARE YEAST Performed at [facility], [address], [place], [place] [address]    Culture PENDING  Incomplete   Report Status PENDING  Incomplete    Labs: Results for orders placed or performed during the hospital encounter of 04/10/24 (from the past 48 hours)  Glucose, capillary     Status: Abnormal   Collection Time: 04/20/24  7:57 AM  Result Value Ref Range   Glucose-Capillary 285 (H) 70 - 99 mg/dL    Comment: Glucose reference range applies only to samples taken after fasting for at least 8 hours.  Basic metabolic panel     Status: Abnormal   Collection Time: 04/20/24  8:46 AM  Result Value Ref Range   Sodium 142 135 - 145 mmol/L   Potassium 5.6 (H) 3.5 - 5.1 mmol/L   Chloride 112 (H) 98 - 111 mmol/L   CO2 20 (L) 22 - 32 mmol/L   Glucose, Bld 307 (H) 70 - 99 mg/dL    Comment: Glucose reference range applies only to samples taken after fasting for at least 8 hours.   BUN 51 (H) 8 - 23 mg/dL   Creatinine, Ser 1.30 (H) 0.61 - 1.24 mg/dL   Calcium  7.2 (L) 8.9 - 10.3 mg/dL   GFR, Estimated 50 (L) >60 mL/min    Comment: (NOTE) Calculated using the  CKD-EPI Creatinine Equation (2021)    Anion gap 10 5 - 15    Comment: Performed at [facility], [address], [place], [place] [address]  CBC     Status: Abnormal   Collection Time: 04/20/24  8:46 AM  Result Value Ref Range   WBC 32.4 (H) 4.0 - 10.5 K/uL   RBC 3.18 (L) 4.22 - 5.81 MIL/uL   Hemoglobin 9.5 (L) 13.0 - 17.0 g/dL   HCT 46.9 (L) 62.9 - 52.8 %   MCV 97.5 80.0 - 100.0 fL   MCH 29.9 26.0 - 34.0 pg   MCHC 30.6 30.0 - 36.0 g/dL   RDW 41.3 (H) 24.4 - 01.0 %   Platelets 381 150 - 400 K/uL   nRBC 0.1 0.0 - 0.2 %    Comment: Performed at [facility], [address], [place], [place] [address]  Culture, blood (Routine X 2) w Reflex to ID Panel     Status: None (Preliminary result)   Collection Time: 04/20/24  8:46 AM  Specimen: BLOOD RIGHT ARM  Result Value Ref Range   Specimen Description      BLOOD RIGHT ARM Performed at [facility], [address] [place], [place] [address]    Special Requests      BOTTLES DRAWN AEROBIC AND ANAEROBIC Blood Culture results may not be optimal due to an inadequate volume of blood received in culture bottles Performed at [facility], [address] [place], [place] [address]    Culture      NO GROWTH < 24 HOURS Performed at [facility], [address], [place], [place] [address]    Report Status PENDING   Culture, blood (Routine X 2) w Reflex to ID Panel     Status: None (Preliminary result)   Collection Time: 04/20/24  8:49 AM   Specimen: BLOOD  Result Value Ref Range   Specimen Description      BLOOD Performed at [facility], [address] [place], [place] [address]    Special Requests      BOTTLES DRAWN AEROBIC AND ANAEROBIC Blood Culture results may not be optimal due to an inadequate volume of blood received in culture bottles Performed at [facility], [address] [place], [place] [address]    Culture      NO GROWTH < 24  HOURS Performed at [facility], [address], [place], [place] [address]    Report Status PENDING   Glucose, capillary     Status: Abnormal   Collection Time: 04/20/24 11:36 AM  Result Value Ref Range   Glucose-Capillary 264 (H) 70 - 99 mg/dL    Comment: Glucose reference range applies only to samples taken after fasting for at least 8 hours.  Culture, Respiratory w Gram Stain     Status: None (Preliminary result)   Collection Time: 04/20/24 12:03 PM   Specimen: Tracheal Aspirate; Respiratory  Result Value Ref Range   Specimen Description      TRACHEAL ASPIRATE Performed at [facility], [address], [place], [place] [address]    Special Requests      NONE Performed at [facility], [address], [place], [place] [address]    Gram Stain NO WBC SEEN RARE GRAM POSITIVE COCCI     Culture      CULTURE REINCUBATED FOR BETTER GROWTH Performed at [facility], [address], [place], [place] [address]    Report Status PENDING   Lactic acid, plasma     Status: None   Collection Time: 04/20/24 12:15 PM  Result Value Ref Range   Lactic Acid, Venous 1.6 0.5 - 1.9 mmol/L    Comment: Performed at [facility], [address], [place], [place] [address]  Blood gas, arterial     Status: Abnormal   Collection Time: 04/20/24  3:01 PM  Result Value Ref Range   FIO2 40 %   Delivery systems VENTILATOR    Mode PRESSURE REGULATED VOLUME CONTROL    MECHVT 600 mL   RATE 18 resp/min   PEEP 5 cm H20   pH, Arterial 7.29 (L) 7.35 - 7.45   pCO2 arterial 50 (H) 32 - 48 mmHg   pO2, Arterial 125 (H) 83 - 108 mmHg   Bicarbonate 24.0 20.0 - 28.0 mmol/L   Acid-base deficit 3.1 (H) 0.0 - 2.0 mmol/L   O2 Saturation 100 %   Patient temperature 36.9    Collection site  ARTERIAL DRAW    Drawn by [id number]     Comment: Performed at [facility], [address] [place], [place] [address]  Glucose, capillary      Status: Abnormal   Collection Time: 04/20/24  3:37 PM  Result Value Ref Range   Glucose-Capillary 152 (H) 70 - 99 mg/dL    Comment: Glucose reference range applies only to samples taken after fasting for at least 8 hours.  Cooxemetry Panel (carboxy, met, total hgb, O2 sat)     Status: Abnormal   Collection Time: 04/20/24  5:01 PM  Result Value Ref Range   Total hemoglobin 8.3 (L) 12.0 - 16.0 g/dL   O2 Saturation 52.8 %   Carboxyhemoglobin 2.6 (H) 0.5 - 1.5 %   Methemoglobin 2.0 (H) 0.0 - 1.5 %    Comment: Performed at [facility], [address] [place], [place] [address]  Lactic acid, plasma     Status: None   Collection Time: 04/20/24  6:20 PM  Result Value Ref Range   Lactic Acid, Venous 1.3 0.5 - 1.9 mmol/L    Comment: Performed at [facility], [address], [place], [place] [address]  Basic metabolic panel     Status: Abnormal   Collection Time: 04/20/24  6:21 PM  Result Value Ref Range   Sodium 145 135 - 145 mmol/L   Potassium 5.6 (H) 3.5 - 5.1 mmol/L   Chloride 114 (H) 98 - 111 mmol/L   CO2 23 22 - 32 mmol/L   Glucose, Bld 197 (H) 70 - 99 mg/dL    Comment: Glucose reference range applies only to samples taken after fasting for at least 8 hours.   BUN 57 (H) 8 - 23 mg/dL   Creatinine, Ser 7.25 (H) 0.61 - 1.24 mg/dL   Calcium  7.0 (L) 8.9 - 10.3 mg/dL   GFR, Estimated 34 (L) >60 mL/min    Comment: (NOTE) Calculated using the CKD-EPI Creatinine Equation (2021)    Anion gap 8 5 - 15    Comment: Performed at [facility], [address], [place], [place] [address]  Cortisol     Status: None   Collection Time: 04/20/24  6:21 PM  Result Value Ref Range   Cortisol, Plasma 13.6 ug/dL    Comment: (NOTE) AM    6.7 - 22.6 ug/dL PM   <03.4       ug/dL Performed at [facility], [address], [place], [place] [address]   Glucose, capillary     Status: Abnormal   Collection Time: 04/20/24  8:01 PM  Result  Value Ref Range   Glucose-Capillary 177 (H) 70 - 99 mg/dL    Comment: Glucose reference range applies only to samples taken after fasting for at least 8 hours.  Glucose, capillary     Status: Abnormal   Collection Time: 04/20/24 11:31 PM  Result Value Ref Range   Glucose-Capillary 251 (H) 70 - 99 mg/dL    Comment: Glucose reference range applies only to samples taken after fasting for at least 8 hours.  Heparin  level (unfractionated)     Status: None   Collection Time: 04/20/24 11:45 PM  Result Value Ref Range   Heparin  Unfractionated 0.34 0.30 - 0.70 IU/mL    Comment: (NOTE) The clinical reportable range upper limit is being lowered to >1.10 to align with the FDA approved guidance for the current laboratory assay.  If heparin  results are below expected values, and patient  dosage has  been confirmed, suggest follow up testing of antithrombin III levels. Performed at [facility], [address] [place], [place] [address]   Glucose, capillary     Status: Abnormal   Collection Time: 04/21/24  3:41 AM  Result Value Ref Range   Glucose-Capillary 236 (H) 70 - 99 mg/dL    Comment: Glucose reference range applies only to samples taken after fasting for at least 8 hours.  CBC     Status: Abnormal   Collection Time: 04/21/24  3:42 AM  Result Value Ref Range   WBC 20.2 (H) 4.0 - 10.5 K/uL   RBC 2.83 (L) 4.22 - 5.81 MIL/uL   Hemoglobin 8.0 (L) 13.0 - 17.0 g/dL   HCT 60.4 (L) 54.0 - 98.1 %   MCV 95.4 80.0 - 100.0 fL   MCH 28.3 26.0 - 34.0 pg   MCHC 29.6 (L) 30.0 - 36.0 g/dL   RDW 19.1 (H) 47.8 - 29.5 %   Platelets 239 150 - 400 K/uL   nRBC 0.1 0.0 - 0.2 %    Comment: Performed at [facility], [address], [place], [place] [address]  Triglycerides     Status: None   Collection Time: 04/21/24  3:42 AM  Result Value Ref Range   Triglycerides 64 <150 mg/dL    Comment: Performed at [facility], [address]  [place], [place] [address]  Basic metabolic panel     Status: Abnormal   Collection Time: 04/21/24  3:42 AM  Result Value Ref Range   Sodium 139 135 - 145 mmol/L   Potassium 5.2 (H) 3.5 - 5.1 mmol/L   Chloride 107 98 - 111 mmol/L   CO2 22 22 - 32 mmol/L   Glucose, Bld 259 (H) 70 - 99 mg/dL    Comment: Glucose reference range applies only to samples taken after fasting for at least 8 hours.   BUN 62 (H) 8 - 23 mg/dL   Creatinine, Ser 4.69 (H) 0.61 - 1.24 mg/dL   Calcium  6.3 (LL) 8.9 - 10.3 mg/dL    Comment: CRITICAL RESULT CALLED TO, READ BACK BY AND VERIFIED WITH HAWKINS, E. RN AT [phone] ON 6.10/25. FA    GFR, Estimated 27 (L) >60 mL/min    Comment: (NOTE) Calculated using the CKD-EPI Creatinine Equation (2021)    Anion gap 10 5 - 15    Comment: Performed at [facility], [address], [place], [place] [address]  Glucose, capillary     Status: Abnormal   Collection Time: 04/21/24  8:06 AM  Result Value Ref Range   Glucose-Capillary 285 (H) 70 - 99 mg/dL    Comment: Glucose reference range applies only to samples taken after fasting for at least 8 hours.   Comment 1 Notify RN    Comment 2 Document in Chart   Type and screen [facility]     Status: None (Preliminary result)   Collection Time: 04/21/24  8:35 AM  Result Value Ref Range   ABO/RH(D) AB POS    Antibody Screen NEG    Sample Expiration 04/24/2024,2359    Unit Number [id number]    Blood Component Type RBC LR PHER2    Unit division 00    Status of Unit ISSUED    Transfusion Status OK TO TRANSFUSE    Crossmatch Result      Compatible Performed at [facility], [address], [place], [place] [address]  Blood gas, arterial     Status: Abnormal   Collection Time: 04/21/24  8:35 AM  Result Value Ref Range   FIO2 40 %   O2 Content 17.7 L/min   Mode PRESSURE REGULATED VOLUME CONTROL    MECHVT 600 mL   RATE 18 resp/min   PEEP 5 cm H20   pH, Arterial 7.25 (L)  7.35 - 7.45   pCO2 arterial 53 (H) 32 - 48 mmHg   pO2, Arterial 112 (H) 83 - 108 mmHg   Bicarbonate 23.3 20.0 - 28.0 mmol/L   Acid-base deficit 4.3 (H) 0.0 - 2.0 mmol/L   O2 Saturation 100 %   Patient temperature 37.4    Collection site A-LINE DRAW    Allens test (pass/fail) PASS PASS    Comment: Performed at [facility], [address], [place], [place] [address]  Prepare RBC (crossmatch)     Status: None   Collection Time: 04/21/24 11:32 AM  Result Value Ref Range   Order Confirmation      ORDER PROCESSED BY BLOOD BANK Performed at [facility], [address], [place], [place] [address]   Glucose, capillary     Status: Abnormal   Collection Time: 04/21/24 11:53 AM  Result Value Ref Range   Glucose-Capillary 281 (H) 70 - 99 mg/dL    Comment: Glucose reference range applies only to samples taken after fasting for at least 8 hours.   Comment 1 Notify RN    Comment 2 Document in Chart   Aerobic/Anaerobic Culture w Gram Stain (surgical/deep wound)     Status: None (Preliminary result)   Collection Time: 04/21/24  2:23 PM   Specimen: Soft Tissue, Other  Result Value Ref Range   Specimen Description      TISSUE INTRA ABDOMINAL NECROTIC TISSUE Performed at [facility], [address] [place], [place] [address]    Special Requests      NONE Performed at [facility], [address] [place], [place] [address]    Gram Stain      FEW WBC PRESENT,BOTH PMN AND MONONUCLEAR RARE GRAM POSITIVE COCCI IN PAIRS AND CHAINS RARE YEAST Performed at [facility], [address], [place], [place] [address]    Culture PENDING    Report Status PENDING   Glucose, capillary     Status: Abnormal   Collection Time: 04/21/24  5:09 PM  Result Value Ref Range   Glucose-Capillary 266 (H) 70 - 99 mg/dL    Comment: Glucose reference range applies only to samples taken after fasting for at least 8 hours.   Comment 1  Notify RN    Comment 2 Document in Chart   Comprehensive metabolic panel     Status: Abnormal   Collection Time: 04/21/24  5:15 PM  Result Value Ref Range   Sodium 136 135 - 145 mmol/L   Potassium 5.8 (H) 3.5 - 5.1 mmol/L   Chloride 105 98 - 111 mmol/L   CO2 21 (L) 22 - 32 mmol/L   Glucose, Bld 309 (H) 70 - 99 mg/dL    Comment: Glucose reference range applies only to samples taken after fasting for at least 8 hours.   BUN 73 (H) 8 - 23 mg/dL   Creatinine, Ser 1.30 (H) 0.61 - 1.24 mg/dL   Calcium  6.1 (LL) 8.9 - 10.3 mg/dL    Comment: CRITICAL RESULT CALLED TO, READ BACK BY AND VERIFIED WITH A.FRY, RN AT 2020 ON 04/21/24 BY  N.THOMPSON    Total Protein 4.7 (L) 6.5 - 8.1 g/dL   Albumin  2.1 (L) 3.5 - 5.0 g/dL   AST 19 15 - 41 U/L   ALT 12 0 - 44 U/L   Alkaline Phosphatase 48 38 - 126 U/L   Total Bilirubin 0.7 0.0 - 1.2 mg/dL   GFR, Estimated 20 (L) >60 mL/min    Comment: (NOTE) Calculated using the CKD-EPI Creatinine Equation (2021)    Anion gap 10 5 - 15    Comment: Performed at [facility], [address], [place], [place] [address]  Lactic acid, plasma     Status: None   Collection Time: 04/21/24  5:15 PM  Result Value Ref Range   Lactic Acid, Venous 1.4 0.5 - 1.9 mmol/L    Comment: Performed at [facility], [address], [place], [place] [address]  CBC with Differential/Platelet     Status: Abnormal   Collection Time: 04/21/24  5:15 PM  Result Value Ref Range   WBC 26.4 (H) 4.0 - 10.5 K/uL   RBC 2.76 (L) 4.22 - 5.81 MIL/uL   Hemoglobin 7.8 (L) 13.0 - 17.0 g/dL   HCT 09.8 (L) 11.9 - 14.7 %   MCV 96.0 80.0 - 100.0 fL   MCH 28.3 26.0 - 34.0 pg   MCHC 29.4 (L) 30.0 - 36.0 g/dL   RDW 82.9 (H) 56.2 - 13.0 %   Platelets 281 150 - 400 K/uL   nRBC 0.0 0.0 - 0.2 %   Neutrophils Relative % 90 %   Neutro Abs 23.6 (H) 1.7 - 7.7 K/uL   Lymphocytes Relative 3 %   Lymphs Abs 0.8 0.7 - 4.0 K/uL   Monocytes Relative 3 %   Monocytes Absolute  0.9 0.1 - 1.0 K/uL   Eosinophils Relative 0 %   Eosinophils Absolute 0.0 0.0 - 0.5 K/uL   Basophils Relative 0 %   Basophils Absolute 0.1 0.0 - 0.1 K/uL   Immature Granulocytes 4 %   Abs Immature Granulocytes 1.08 (H) 0.00 - 0.07 K/uL    Comment: Performed at [facility], [address], [place], [place] [address]  Blood gas, arterial     Status: Abnormal   Collection Time: 04/21/24  5:15 PM  Result Value Ref Range   FIO2 40 %   Delivery systems VENTILATOR    Mode PRESSURE REGULATED VOLUME CONTROL    MECHVT 600 mL   RATE 22 resp/min   PEEP 5 cm H20   pH, Arterial 7.28 (L) 7.35 - 7.45   pCO2 arterial 47 32 - 48 mmHg   pO2, Arterial 75 (L) 83 - 108 mmHg   Bicarbonate 22.1 20.0 - 28.0 mmol/L   Acid-base deficit 4.8 (H) 0.0 - 2.0 mmol/L   O2 Saturation 98.8 %   Patient temperature 37.0    Collection site A-LINE DRAW     Comment: Performed at [facility], [address] [place], [place] [address]  Cooxemetry Panel (carboxy, met, total hgb, O2 sat)     Status: Abnormal   Collection Time: 04/21/24  6:15 PM  Result Value Ref Range   Total hemoglobin 7.2 (L) 12.0 - 16.0 g/dL   O2 Saturation 95.2 %   Carboxyhemoglobin 2.4 (H) 0.5 - 1.5 %   Methemoglobin <0.7 0.0 - 1.5 %    Comment: Performed at [facility], [address] [place], [place] [address]  Glucose, capillary     Status: Abnormal  Collection Time: 04/21/24  8:04 PM  Result Value Ref Range   Glucose-Capillary 278 (H) 70 - 99 mg/dL    Comment: Glucose reference range applies only to samples taken after fasting for at least 8 hours.   Comment 1 Notify RN    Comment 2 Document in Chart   Glucose, capillary     Status: Abnormal   Collection Time: 04/21/24 11:31 PM  Result Value Ref Range   Glucose-Capillary 255 (H) 70 - 99 mg/dL    Comment: Glucose reference range applies only to samples taken after fasting for at least 8 hours.   Comment 1 Notify RN    Comment 2  Document in Chart   Hemoglobin and hematocrit, blood     Status: Abnormal   Collection Time: 04/21/24 11:47 PM  Result Value Ref Range   Hemoglobin 8.8 (L) 13.0 - 17.0 g/dL   HCT 16.1 (L) 09.6 - 04.5 %    Comment: Performed at [facility], [address] [place], [place] [address]  CBC     Status: Abnormal   Collection Time: 04/22/24  3:41 AM  Result Value Ref Range   WBC 27.6 (H) 4.0 - 10.5 K/uL   RBC 3.03 (L) 4.22 - 5.81 MIL/uL   Hemoglobin 8.8 (L) 13.0 - 17.0 g/dL   HCT 19.1 (L) 47.8 - 29.5 %   MCV 89.8 80.0 - 100.0 fL   MCH 29.0 26.0 - 34.0 pg   MCHC 32.4 30.0 - 36.0 g/dL   RDW 62.1 (H) 30.8 - 65.7 %   Platelets 283 150 - 400 K/uL   nRBC 0.3 (H) 0.0 - 0.2 %    Comment: Performed at [facility], [address], [place], [place] [address]  Basic metabolic panel     Status: Abnormal   Collection Time: 04/22/24  3:41 AM  Result Value Ref Range   Sodium 136 135 - 145 mmol/L   Potassium 5.1 3.5 - 5.1 mmol/L   Chloride 103 98 - 111 mmol/L   CO2 21 (L) 22 - 32 mmol/L   Glucose, Bld 287 (H) 70 - 99 mg/dL    Comment: Glucose reference range applies only to samples taken after fasting for at least 8 hours.   BUN 80 (H) 8 - 23 mg/dL   Creatinine, Ser 2.95 (H) 0.61 - 1.24 mg/dL   Calcium  6.2 (LL) 8.9 - 10.3 mg/dL    Comment: CRITICAL RESULT CALLED TO, READ BACK BY AND VERIFIED WITH A. FRY, RN 04/22/24 0441 BY K. DAVIS   GFR, Estimated 17 (L) >60 mL/min    Comment: (NOTE) Calculated using the CKD-EPI Creatinine Equation (2021)    Anion gap 12 5 - 15    Comment: Performed at [facility], [address], [place], [place] [address]  Magnesium      Status: None   Collection Time: 04/22/24  3:41 AM  Result Value Ref Range   Magnesium  2.3 1.7 - 2.4 mg/dL    Comment: Performed at [facility], [address], [place], [place] [address]  Phosphorus     Status: Abnormal   Collection Time: 04/22/24  3:41 AM   Result Value Ref Range   Phosphorus 8.5 (H) 2.5 - 4.6 mg/dL    Comment: Performed at [facility], [address], [place], [place] [address]  Glucose, capillary     Status: Abnormal   Collection Time: 04/22/24  3:58 AM  Result Value Ref Range  Glucose-Capillary 248 (H) 70 - 99 mg/dL    Comment: Glucose reference range applies only to samples taken after fasting for at least 8 hours.    Imaging / Studies: No results found.   Medications / Allergies: per chart  Antibiotics: Anti-infectives (From admission, onward)    Start     Dose/Rate Route Frequency Ordered Stop   04/21/24 1400  clindamycin (CLEOCIN) 900 mg, gentamicin  (GARAMYCIN ) 240 mg in sodium chloride  0.9 % 1,000 mL for intraperitoneal lavage  Status:  Discontinued         Irrigation To Surgery 04/21/24 1346 04/21/24 1618   04/20/24 1400  piperacillin -tazobactam (ZOSYN ) IVPB 3.375 g        3.375 g 12.5 mL/hr over 240 Minutes Intravenous Every 8 hours 04/20/24 0755     04/20/24 1000  metroNIDAZOLE  (FLAGYL ) IVPB 500 mg  Status:  Discontinued        500 mg 100 mL/hr over 60 Minutes Intravenous Every 12 hours 04/20/24 0320 04/20/24 0752   04/20/24 1000  micafungin (MYCAMINE) 150 mg in sodium chloride  0.9 % 100 mL IVPB        150 mg 107.5 mL/hr over 1 Hours Intravenous Every 24 hours 04/20/24 0752     04/20/24 0245  ceFEPIme  (MAXIPIME ) 2 g in sodium chloride  0.9 % 100 mL IVPB  Status:  Discontinued        2 g 200 mL/hr over 30 Minutes Intravenous Every 8 hours 04/20/24 0232 04/20/24 0752   04/20/24 0100  metroNIDAZOLE  (FLAGYL ) IVPB 500 mg        500 mg 100 mL/hr over 60 Minutes Intravenous On call to O.R. 04/20/24 0004 04/20/24 0100   04/14/24 1800  ceFEPIme  (MAXIPIME ) 2 g in sodium chloride  0.9 % 100 mL IVPB        2 g 200 mL/hr over 30 Minutes Intravenous Every 8 hours 04/14/24 1003 04/19/24 0957   04/14/24 1600  vancomycin  (VANCOCIN ) IVPB 1000 mg/200 mL premix  Status:  Discontinued        1,000  mg 200 mL/hr over 60 Minutes Intravenous Every 24 hours 04/13/24 1420 04/14/24 1000   04/14/24 1600  vancomycin  (VANCOREADY) IVPB 1500 mg/300 mL  Status:  Discontinued        1,500 mg 150 mL/hr over 120 Minutes Intravenous Every 24 hours 04/14/24 1002 04/16/24 0744   04/14/24 1200  vancomycin  (VANCOCIN ) IVPB 1000 mg/200 mL premix  Status:  Discontinued        1,000 mg 200 mL/hr over 60 Minutes Intravenous Every 24 hours 04/13/24 1053 04/13/24 1109   04/13/24 2200  ceFEPIme  (MAXIPIME ) 2 g in sodium chloride  0.9 % 100 mL IVPB  Status:  Discontinued        2 g 200 mL/hr over 30 Minutes Intravenous Every 12 hours 04/13/24 1036 04/13/24 1109   04/13/24 2200  ceFEPIme  (MAXIPIME ) 2 g in sodium chloride  0.9 % 100 mL IVPB  Status:  Discontinued        2 g 200 mL/hr over 30 Minutes Intravenous Every 12 hours 04/13/24 1425 04/14/24 1003   04/13/24 1500  vancomycin  (VANCOCIN ) 2,000 mg in sodium chloride  0.9 % 500 mL IVPB        2,000 mg 260 mL/hr over 120 Minutes Intravenous  Once 04/13/24 1409 04/13/24 1850   04/13/24 1500  ceFEPIme  (MAXIPIME ) 2 g in sodium chloride  0.9 % 100 mL IVPB  Status:  Discontinued        2 g 200 mL/hr over 30 Minutes Intravenous Every  12 hours 04/13/24 1420 04/13/24 1425   04/13/24 1500  metroNIDAZOLE  (FLAGYL ) IVPB 500 mg  Status:  Discontinued        500 mg 100 mL/hr over 60 Minutes Intravenous Every 12 hours 04/13/24 1420 04/17/24 0725   04/13/24 1200  vancomycin  (VANCOCIN ) 2,000 mg in sodium chloride  0.9 % 500 mL IVPB  Status:  Discontinued        2,000 mg 260 mL/hr over 120 Minutes Intravenous  Once 04/13/24 1052 04/13/24 1121   04/13/24 1100  metroNIDAZOLE  (FLAGYL ) IVPB 500 mg  Status:  Discontinued        500 mg 100 mL/hr over 60 Minutes Intravenous 2 times daily 04/13/24 1007 04/13/24 1109   04/13/24 1100  vancomycin  (VANCOCIN ) IVPB 1000 mg/200 mL premix  Status:  Discontinued        1,000 mg 200 mL/hr over 60 Minutes Intravenous  Once 04/13/24 1007 04/13/24 1020    04/13/24 1015  ceFEPIme  (MAXIPIME ) 2 g in sodium chloride  0.9 % 100 mL IVPB        2 g 200 mL/hr over 30 Minutes Intravenous STAT 04/13/24 1007 04/14/24 1721   04/10/24 2200  ceFAZolin  (ANCEF ) IVPB 2g/100 mL premix        2 g 200 mL/hr over 30 Minutes Intravenous Every 8 hours 04/10/24 1833 04/11/24 0531   04/10/24 0600  ceFAZolin  (ANCEF ) IVPB 2g/100 mL premix        2 g 200 mL/hr over 30 Minutes Intravenous On call to O.R. 04/10/24 0533 04/10/24 1526         Note: Portions of this report may have been transcribed using voice recognition software. Every effort was made to ensure accuracy; however, inadvertent computerized transcription errors may be present.   Any transcriptional errors that result from this process are unintentional.    Kristopher Goodie, MD, FACS, MASCRS Esophageal, Gastrointestinal & Colorectal Surgery Robotic and Minimally Invasive Surgery  [facility] [address] [place], [place] [address] [phone] Fax [phone] Main  CONTACT INFORMATION: Weekday (9AM-5PM): Call [facility] main office at [phone] Weeknight (5PM-9AM) or Weekend/Holiday: Check EPIC Web Links tab & use AMION (password  [email or web address]) for General Surgery [facility] coverage  Please, DO NOT use SecureChat  (it is not reliable communication to reach operating surgeons & will lead to a delay in care).   Epic staff messaging available for outptient concerns needing 1-2 business day response.      04/22/2024  6:09 AM

## 2024-04-22 NOTE — Progress Notes (Signed)
 eLink Physician-Brief Progress Note Patient Name: PATRICIO POPWELL DOB: 18-Jan-1951 MRN: [id number]   Date of Service  04/22/2024  HPI/Events of Note  Corrected calcium  7.7  eICU Interventions  Calcium  gluconate 2 gm iv x 1 ordered.        Francesca Strome U Aithana Kushner 04/22/2024, 5:25 AM

## 2024-04-22 NOTE — Progress Notes (Signed)
 Inpatient Rehab Admissions Coordinator:   Pt. Remains on vent. CIR will sign off. May re-engage us  once more medically stable and participating with therapies.   Wandalee Gust, MS, CCC-SLP Rehab Admissions Coordinator  [phone] (celll) [phone] (office)

## 2024-04-22 NOTE — Progress Notes (Signed)
 Nutrition Follow-up  DOCUMENTATION CODES:      INTERVENTION:  - Continue goal TPN.             - TPN management per pharmacy.   - Per Surgery, patient on trickle feeds: Vital High Protein @ 33mL/hr - provides 480 kcals, 42g protein, and free water  - Once able to advance past trickle TF's, would recommend below TF regimen: Vital 1.5 at 55 ml/h (1320 ml per day) *Would recommend 10mL Q8H advancements, or per Surgery Prosource TF20 60 ml QID Provides 2300 kcal, 169 gm protein, 1008 ml free water daily  - FWF per CCM/Surgery.   - Daily weights.   NUTRITION DIAGNOSIS:   Increased nutrient needs related to acute illness as evidenced by estimated needs. *ongoing  GOAL:   Patient will meet greater than or equal to 90% of their needs *met with TPN  MONITOR:    (TPN)  REASON FOR ASSESSMENT:   Consult New TPN/TNA  ASSESSMENT:   73 y.o male patient with history of Parastomal hernia of ileal conduit. On 5/30 underwent hernia repair with urostomy revision.  5/30 Admit; s/p lysis of ahdesions, repair of incisional, parastomal, left inguinal incarcerated abdominal wall hernias, urostomy ileal conduit revision; CLD 5/31 FLD 6/1 Heart healthy diet 6/2 NPO; NGT placed for suction; TPN initiated 6/4 TPN increased to goal; NGT removed by patient; CLD then back to NPO after emesis; NGT replaced 6/7 SLP eval, cleared for a DYS 2 diet 6/8 Rapid response for increased lethargy 6/9 OR in the early AM for ex-lap, drainage of right abdominal wall abscess, explantation fo abdominal mesh, small bowel resection (left In discontinuity), AbThera wound VAC placement; returned to ICU intubated 6/10 s/p lysis of adhesions, abdominal wall debridement, ileal resection with end ileostomy, fascia closure with wound VAC placement; Vital HP initiated @ 20 in the evening  Patient remains intubated on ventilator support MV: 13.5 L/min Temp (24hrs), Avg:98.1 F (36.7 C), Min:96.3 F (35.7 C),  Max:99.1 F (37.3 C)  Patient went to OR yesterday and now with end ileostomy with fascia closure.  Surgery started Vital HP @ 60mL/hr via NGT yesterday evening.   TPN remains at goal.  Per Surgery note today, plan to continue trickle feeds, patient having some bowel function. Plan to advance past trickles once patient off pressors. Plan to continue goal TPN for now.   Patient with worsening renal function. Plan to start CRRT. Will increase protein needs to account for CCRT being initiated in addition to several abdominal surgeries with wound vac placement.    Admit weight: 240# Current weight: 247# (as of 6/9, was 219# on 6/8 ?) I&Os: +9.4L since admit + for mid pitting generalized edema   Medications reviewed and include: Colace, Miralax ,  Fentanyl  Levophed  @ 10 mcg/min Vasopressin @ 0.04 units/min Propofol  @ 23.57mL/hr (provides 631 kcals over 24 hours)   Labs reviewed:  Creatinine 3.70 Phosphorus 8.5 Triglycerides 64 (as of 6/10)   Diet Order:   Diet Order             Diet NPO time specified Except for: Ice Chips  Diet effective now                   EDUCATION NEEDS:  Not appropriate for education at this time  Skin:  Skin Assessment: Skin Integrity Issues: Skin Integrity Issues:: Incisions Incisions: 5/30 abdomen  Last BM:  6/6 - new ileostomy as of 6/10  Height:  Ht Readings from Last 1 Encounters:  04/20/24 5' 11 (1.803 m)   Weight:  Wt Readings from Last 1 Encounters:  04/20/24 112.1 kg   Ideal Body Weight:  78.18 kg  BMI:  Body mass index is 34.47 kg/m.  Estimated Nutritional Needs:  Kcal:  2100-2300 kcals Protein:  155-180 grams Fluid:  >/= 2.1L    Scheryl Cushing RD, LDN Contact via Secure Chat.

## 2024-04-22 NOTE — Procedures (Signed)
 Central Venous Catheter Insertion Procedure Note  Kristopher Thompson  [id number]  [birth year]  Date:04/22/24  Time:11:03 AM   Provider Performing:Ashleigh Rollene Clink MSN student with guidance from Johnathan Myron ACNP  Procedure: Insertion of Non-tunneled Central Venous Catheter(36556)with US  guidance (78295)    Indication(s) Hemodialysis  Consent Risks of the procedure as well as the alternatives and risks of each were explained to the patient and/or caregiver.  Consent for the procedure was obtained and is signed in the bedside chart  Anesthesia Topical only with 1% lidocaine    Timeout Verified patient identification, verified procedure, site/side was marked, verified correct patient position, special equipment/implants available, medications/allergies/relevant history reviewed, required imaging and test results available.  Sterile Technique Maximal sterile technique including full sterile barrier drape, hand hygiene, sterile gown, sterile gloves, mask, hair covering, sterile ultrasound probe cover (if used).  Procedure Description Area of catheter insertion was cleaned with chlorhexidine  and draped in sterile fashion.   With real-time ultrasound guidance a HD catheter was placed into the left internal jugular vein.  Nonpulsatile blood flow and easy flushing noted in all ports.  The catheter was sutured in place and sterile dressing applied.  Complications/Tolerance None; patient tolerated the procedure well. Chest X-ray is ordered to verify placement for internal jugular or subclavian cannulation.  Chest x-ray is not ordered for femoral cannulation.  EBL Minimal  Specimen(s) None  I was present, scrubbed in. Instructed and assisted A Pate S-NP for the entire procedure

## 2024-04-22 NOTE — TOC Progression Note (Signed)
 Transition of Care [facility]) - Progression Note    Patient Details  Name: Kristopher Thompson MRN: [id number] Date of Birth: 11/12/1951  Transition of Care [facility]) CM/SW Contact  Amaryllis Junior, [place] Phone Number: 04/22/2024, 11:38 AM  Clinical Narrative:    Pt not medically stable to pursue CIR at this time. TOC will continue to follow.    Expected Discharge Plan: Home/Self Care Barriers to Discharge: Continued Medical Work up  Expected Discharge Plan and Services   Discharge Planning Services: CM Consult   Living arrangements for the past 2 months: Single Family Home                                       Social Determinants of Health (SDOH) Interventions SDOH Screenings   Food Insecurity: No Food Insecurity (04/12/2024)  Housing: Low Risk  (04/12/2024)  Transportation Needs: No Transportation Needs (04/12/2024)  Utilities: Not At Risk (04/12/2024)  Social Connections: Socially Integrated (04/10/2024)  Tobacco Use: Medium Risk (04/10/2024)    Readmission Risk Interventions    04/12/2024    4:58 PM  Readmission Risk Prevention Plan  Transportation Screening Complete  PCP or Specialist Appt within 3-5 Days Complete  HRI or Home Care Consult Complete  Social Work Consult for Recovery Care Planning/Counseling Complete  Palliative Care Screening Not Applicable  Medication Review Oceanographer) Complete

## 2024-04-22 NOTE — Consult Note (Signed)
 [facility] Nephrology Consultation Note  Requesting MD: Dr. Candyce Champagne and Dr. [name] Reason for consult: AKI, anasarca  HPI:  Kristopher Thompson is a 73 y.o. male with past medical history significant for hypertension, DM, CAD, bladder cancer, CHF, right bundle branch block who had acute bowel perforation and status post OR for bowel repair on 6/8, lysis of adhesion, ileal resection end ileostomy hernia repairs, septic shock seen as a consultation for the evaluation and management of AKI and oliguria. The patient underwent multiple abdominal surgery developed septic shock and respiratory failure.  He was placed on multiple pressors including vasopressin, Levophed  and intubated for respiratory failure. The baseline serum creatinine level is around 0.8-1.2 however the creatinine level continue to go up to 3.70 today.  He received multiple boluses of IV fluid.  Urine output is recorded only 170 cc.  He developed anasarca and third spacing.  He does have multiple drains in his abdomen with output around 1 L in 24 hours.  He is net positive by almost 19.5 L. He is currently on Zosyn , Levophed , vasopressin and sedation.  The CT scan showed moderate right and severe left hydronephrosis and left renal parenchyma atrophy.  Also bilateral hydroureter. The patient is currently intubated and sedated therefore unable to obtain review of system.  His wife was presented at the bedside.  I have discussed with ICU team and nursing staffs.  PMHx:   Past Medical History:  Diagnosis Date   At risk for sleep apnea    12-25-2017   STOP-BANG SCORE= 5   --- SENT TO PCP   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   moderate atypia - right low back   Atypical nevus 04/04/2007   moderate to marked - right upper back (wider shave)   Atypical nevus 04/04/2007   moderate atypia - center chest (wider shave)   Atypical nevus 04/04/2007   slight atypia - right thigh   Atypical nevus 11/29/2011   mild atypia  - center upper back   Atypical nevus 11/29/2011   mild atypia - center chest   Bacteremia due to Klebsiella pneumoniae 10/09/2017   Bladder cancer (HCC) dx 07/2017   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  s/p  cystectomy w/ ileal conduit urinary diversion   Candida infection    CHF (congestive heart failure) (HCC)    Colostomy in place (HCC)    since 08-08-2017-- per pt 12-25-2017 reddness around stoma   Diabetes mellitus without complication (HCC)    GERD (gastroesophageal reflux disease)    H/O hiatal hernia    History of sepsis 09/2017   dx bacteremia due to klebsiella pneumoniae,  post op intraabdominal abscess   Prostate cancer [facility]) urologist-- dr Rozanne Corners   10-02-2012 s/p  prostatectomy-- Stage T1c   RBBB    Renal disorder    pt. denies   Sleep apnea    cpap   Squamous cell carcinoma of skin 05/22/2013   left cheek - CX3 + 5FU   Wears glasses     Past Surgical History:  Procedure Laterality Date   ABDOMINAL SURGERY     APPENDECTOMY  1972   BOWEL RESECTION N/A 04/20/2024   Procedure: SMALL BOWEL RESECTION;  Surgeon: Aldean Hummingbird, MD;  Location: [facility];  Service: General;  Laterality: N/A;   CHOLECYSTECTOMY  1985   COLONOSCOPY N/A 06/29/2021   Procedure: COLONOSCOPY;  Surgeon: Joyce Nixon, MD;  Location: [facility];  Service: Endoscopy;  Laterality: N/A;   COLOSTOMY REVERSAL N/A 01/08/2018   Procedure:  COLOSTOMY REVERSAL;  Surgeon: Joyce Nixon, MD;  Location: [facility];  Service: General;  Laterality: N/A;   CYSTOSCOPY WITH RETROGRADE PYELOGRAM, URETEROSCOPY AND STENT PLACEMENT Right 06/10/2017   Procedure: CYSTOSCOPY WITH RIGHT URETEROSCOPY WITH RIGHT STENT PLACEMENT;  Surgeon: Florencio Hunting, MD;  Location: [facility];  Service: Urology;  Laterality: Right;   EUS N/A 04/16/2018   Procedure: FULL UPPER ENDOSCOPIC ULTRASOUND (EUS) RADIAL;  Surgeon: Evangeline Hilts, MD;  Location: [facility];  Service: Endoscopy;  Laterality: N/A;   FLEXIBLE SIGMOIDOSCOPY N/A 12/13/2017    Procedure: FLEXIBLE SIGMOIDOSCOPY;  Surgeon: Joyce Nixon, MD;  Location: [facility];  Service: Endoscopy;  Laterality: N/A;   ILEO CONDUIT     IR CATHETER TUBE CHANGE  12/13/2017   IR CATHETER TUBE CHANGE  01/17/2018   IR CATHETER TUBE CHANGE  02/28/2018   IR CATHETER TUBE CHANGE  07/18/2018   IR CATHETER TUBE CHANGE  08/22/2018   IR CATHETER TUBE CHANGE  10/31/2018   IR CONVERT LEFT NEPHROSTOMY TO NEPHROURETERAL CATH  10/24/2017   IR EXT NEPHROURETERAL CATH EXCHANGE  05/19/2018   IR EXT NEPHROURETERAL CATH EXCHANGE  06/16/2018   IR NEPHRO TUBE REMOV/FL  10/24/2017   IR NEPHROSTOGRAM LEFT THRU EXISTING ACCESS  12/05/2018   IR NEPHROSTOMY PLACEMENT LEFT  10/07/2017   IR NEPHROSTOMY TUBE CHANGE  04/11/2018   LAPAROTOMY N/A 04/20/2024   Procedure: EXPLORATORY LAPAROTOMY;  Surgeon: Aldean Hummingbird, MD;  Location: [facility];  Service: General;  Laterality: N/A;   POLYPECTOMY  06/29/2021   Procedure: POLYPECTOMY;  Surgeon: Joyce Nixon, MD;  Location: [facility];  Service: Endoscopy;;   ROBOT ASSISTED LAPAROSCOPIC RADICAL PROSTATECTOMY  10/02/2012   Procedure: ROBOTIC ASSISTED LAPAROSCOPIC RADICAL PROSTATECTOMY LEVEL 2;  Surgeon: [name], MD;  Location: [facility];  Service: Urology;  Laterality: N/A;   ROBOTIC ASSISTED LAPAROSCOPIC BLADDER DIVERTICULECTOMY N/A 08/08/2017   Procedure: XI ROBOTIC ASSISTED LAPAROSCOPIC RADICAL CYSTECTOMY COVERTED TO OPEN PELVIC LYMPHADNECTOMY BILATERAL AND ILEAL CONDUIT URINARY DIVERSION;  Surgeon: Florencio Hunting, MD;  Location: [facility];  Service: Urology;  Laterality: N/A;   TRANSURETHRAL RESECTION OF BLADDER TUMOR  06/10/2017   Procedure: TRANSURETHRAL RESECTION OF BLADDER TUMOR (TURBT);  Surgeon: Florencio Hunting, MD;  Location: [facility];  Service: Urology;;   VACUUM ASSISTED CLOSURE CHANGE N/A 04/20/2024   Procedure: PLACEMENT OF Gabriel John;  Surgeon: Aldean Hummingbird, MD;  Location: [facility];  Service: General;  Laterality: N/A;   VENTRAL HERNIA REPAIR N/A 04/10/2024   Procedure:  REPAIR, HERNIA, VENTRAL;  Surgeon: Candyce Champagne, MD;  Location: [facility];  Service: General;  Laterality: N/A;   XI ROBOT ABDOMINAL PERINEAL RESECTION N/A 08/08/2017   Procedure: REPAIR OF RECTAL TEAR POSSIBLE PARTIAL PROCTECTOMY, CREATION OF  OSTOMY;  Surgeon: Joyce Nixon, MD;  Location: [facility];  Service: General;  Laterality: N/A;   XI ROBOTIC ASSISTED PARASTOMAL HERNIA REPAIR N/A 04/10/2024   Procedure: REPAIR, HERNIA, PARASTOMAL AND VENTRAL HERNIAS, ROBOT-ASSISTED, LEFT INGUINAL HERNIA, LYSIS OF ADHESIONS INCARCERATED AND INSIONAL HERNIAS AND UROSTOMY REVISION;  Surgeon: Candyce Champagne, MD;  Location: [facility];  Service: General;  Laterality: N/A;    Family Hx:  Family History  Problem Relation Age of Onset   Lung cancer Mother    Hypertension Father    Colon cancer Other    CAD Neg Hx    Diabetes Neg Hx    Stroke Neg Hx     Social History:  reports that he quit smoking about 44 years ago. His smoking use included cigarettes. He started smoking about  53 years ago. He has never used smokeless tobacco. He reports current alcohol  use. He reports that he does not use drugs.  Allergies:  Allergies  Allergen Reactions   Demerol  [Meperidine ] Nausea And Vomiting and Other (See Comments)    SEVERE NAUSEA    Medications: Prior to Admission medications   Medication Sig Start Date End Date Taking? Authorizing Provider  acetaminophen  (TYLENOL ) 500 MG tablet Take 1,000 mg by mouth every 8 (eight) hours as needed for mild pain or headache.    Yes [provider]  bisacodyl  (DULCOLAX) 5 MG EC tablet Take 2 tablets (10 mg total) by mouth daily as needed for moderate constipation. Patient taking differently: Take 15 mg by mouth daily. 12/09/18  Yes Armenta Landau, MD  Cyanocobalamin  (VITAMIN B-12 PO) Place 1 tablet under the tongue 3 (three) times a week.   Yes [provider]  cyclobenzaprine  (FLEXERIL ) 5 MG tablet Take 1 tablet (5 mg total) by mouth 3 (three) times daily as  needed for muscle spasms. 04/10/24  Yes Candyce Champagne, MD  D-MANNOSE PO Take 2-3 tablets by mouth See admin instructions. Take 2 tablets by mouth every morning & take 1 tablet by mouth at bedtime.   Yes [provider]  diphenhydrAMINE  (BENADRYL ) 25 mg capsule Take 25 mg by mouth every other day as needed (congestion/allergies (every other night)).   Yes [provider]  ELIQUIS 5 MG TABS tablet Take 5 mg by mouth 2 (two) times daily.   Yes [provider]  ipratropium (ATROVENT ) 0.03 % nasal spray Place 1 spray into both nostrils 3 (three) times daily as needed (congestion).   Yes [provider]  metFORMIN (GLUCOPHAGE) 500 MG tablet Take 500 mg by mouth in the morning. 01/29/24  Yes [provider]  Multiple Vitamins-Minerals (CENTRUM MINIS ADULTS 50+) TABS Take 1 tablet by mouth See admin instructions. Take 1 tablet by mouth twice daily on every other day.   Yes [provider]  Omega-3 Fatty Acids (FISH OIL PO) Take 1 capsule by mouth 3 (three) times a week.   Yes [provider]  oxyCODONE -acetaminophen  (PERCOCET) 5-325 MG tablet Take 1 tablet by mouth every 6 (six) hours as needed for severe pain (pain score 7-10). Can increase to 2 pills at a time if needed 04/10/24  Yes Gross, Landon Pinion, MD  polyethylene glycol (MIRALAX ) 17 g packet Take 17 g by mouth daily as needed (constipation.).   Yes [provider]  rosuvastatin  (CRESTOR ) 10 MG tablet Take 10 mg by mouth every evening.   Yes [provider]  triamcinolone  cream (KENALOG ) 0.1 % Apply 1 application topically daily as needed. 10/17/21  Yes Devon Fogo, MD    I have reviewed the patient's current medications.  Labs: Renal Panel: Recent Labs  Lab 04/17/24 0505 04/18/24 0542 04/19/24 3086 04/19/24 2259 04/20/24 0254 04/20/24 5784 04/20/24 1821 04/21/24 0342 04/21/24 1715 04/22/24 0341  NA 150* 150* 152*   < > 140 142 145 139 136 136  K 3.8 3.8 4.3   <  > 4.8 5.6* 5.6* 5.2* 5.8* 5.1  CL 118* 122* 124*   < > 115* 112* 114* 107 105 103  CO2 24 23 21*   < > 13* 20* 23 22 21* 21*  GLUCOSE 162* 165* 172*   < > 154* 307* 197* 259* 309* 287*  BUN 62* 60* 50*   < > 35* 51* 57* 62* 73* 80*  CREATININE 1.30* 1.12 1.21   < >  0.84 1.49* 2.03* 2.47* 3.13* 3.70*  CALCIUM  9.0 8.9 8.7*   < > 7.0* 7.2* 7.0* 6.3* 6.1* 6.2*  MG 2.1 2.2 2.2  --  1.3*  --   --   --   --  2.3  PHOS 2.7 3.0 2.9  --  3.0  --   --   --   --  8.5*   < > = values in this interval not displayed.     CBC:    Latest Ref Rng & Units 04/22/2024    3:41 AM 04/21/2024   11:47 PM 04/21/2024    5:15 PM  CBC  WBC 4.0 - 10.5 K/uL 27.6   26.4   Hemoglobin 13.0 - 17.0 g/dL 8.8  8.8  7.8   Hematocrit 39.0 - 52.0 % 27.2  27.6  26.5   Platelets 150 - 400 K/uL 283   281      Anemia Panel:  Recent Labs    04/20/24 0846 04/21/24 0342 04/21/24 1715 04/21/24 2347 04/22/24 0341  HGB 9.5* 8.0* 7.8* 8.8* 8.8*  MCV 97.5 95.4 96.0  --  89.8    Recent Labs  Lab 04/16/24 0433 04/20/24 0254 04/21/24 1715  AST 22 18 19   ALT 11 14 12   ALKPHOS 51 29* 48  BILITOT 1.3* 0.9 0.7  PROT 5.4* 3.4* 4.7*  ALBUMIN  2.1* <1.5* 2.1*    Lab Results  Component Value Date   HGBA1C 5.3 04/01/2024    ROS: Unable to obtain review of system as patient is intubated and sedated.  Physical Exam: Vitals:   04/22/24 0715 04/22/24 0803  BP:    Pulse: (!) 57 (!) 57  Resp: (!) 25 (!) 25  Temp: (!) 97.3 F (36.3 C) (!) 97.2 F (36.2 C)  SpO2: 97% 96%     General exam: Critically ill looking male, intubated, sedated.  Anasarca Respiratory system: Coarse breath sound bilateral, on mechanical ventilation. Cardiovascular system: S1 & S2 heard, RRR.   Gastrointestinal system: Multiple drains are presented, no distention noted.   Central nervous system: Sedated. Extremities: Significant peripheral edema present, no cyanosis Skin: No rashes, lesions or ulcers, anasarca Psychiatry:  Sedated  Assessment/Plan:  # Acute kidney injury, oliguric with anasarca likely ischemic ATN due to septic shock and obstructive uropathy given bilateral hydronephrosis.  Noted CT scan of abdomen pelvis.  The patient is 19.5 L fluid positive.  Currently on 2 pressors to maintain blood pressure.  I will start CRRT today.  All 4K bath, fixed dose of heparin  and a UF around 100-200 cc net negative.  I have discussed this with the patient's wife and she agreed.  ICU team will place HD catheter.  # Bilateral hydronephrosis severe on the left side: Also hydroureter, unknown if it has resolved after the abdominal surgery but I will recommend urology consult and possibly may need to repeat imaging.  # Acute bowel perforation is status post bowel repair ileal conduit and hernia repairs urostomy ileal conduit revision.  As per surgical team.  Currently on TPN.  # Septic shock: Avoid further IV fluid, continue pressors and antibiotics per ICU team.  # Acute hypoxic respiratory failure/VDRF per pulmonary team.  # Metabolic acidosis managing with sodium bicarbonate .  CRRT as above.  Thank you for the consult, we will continue to follow.  Carlia Bomkamp [place] Planas 04/22/2024, 8:14 AM  [employer].

## 2024-04-22 NOTE — Consult Note (Signed)
 WOC consulted for new midline NPWT dressing and new ileostomy. Patient with existing urinary stoma (Dr. Rozanne Corners 2018), previous colostomy (2019).   Will plan to see patient 6/12 for NPWT dressing changes M/Th and new ileostomy needs.   Notified nursing staff, request NPWT dressings to be ordered.   Jessup Ogas [facility], CNS, [employer] [phone]

## 2024-04-22 NOTE — Consult Note (Signed)
 Urology Consult Note   Requesting Attending Physician:  Candyce Champagne, MD Service Providing Consult: Urology  Consulting Attending: Dr. Rozanne Corners   Reason for Consult:  bilateral hydronephrosis  HPI: Kristopher Thompson is seen in consultation for reasons noted above at the request of Gross, Landon Pinion, MD. Patient is a 73 year old male with PMH significant for bladder cancer, CHF, RBBB, HTN, T2DM, CAD, and acute bowel perforation.  He has had extensive and complicated bowel surgeries and treatment of parastomal and incisional incarcerated abdominal wall hernias.  These have included robotic lysis of adhesions for 4 hours, transversus abdominis release bilaterally, robotic repair of incisional, parastomal, and left inguinal incarcerated abdominal wall hernias with mesh, and urostomy ileal conduit revision.  Patient's renal function was normal with serum creatinine at 0.84 on 6/9.  It has worsened daily, now at 3.70 on 6/11.  CT A/P noted bilateral hydronephrosis.  Left side hydronephrosis has been chronic for many years but right side is a new finding.  [facility] has been asked to speak to this imaging.  On my arrival patient was intubated and sedated, remaining in critical condition and about to start dialysis.  I reviewed the case and plan with his wife.  All questions were answered to her satisfaction.  ------------------  Assessment:  73 y.o. male with bilateral hydronephrosis   Recommendations: # Bilateral hydronephrosis #AKI Left-sided hydronephrosis is chronic.  This has been appreciated on imaging going back around 5 years.  This looks relatively unchanged.  The right side is a new finding and could be attributable to either reflux or extrinsic compression from abdominal swelling in light of his extensive surgery.  The acute kidney injury is most likely prerenal.  His ongoing vasopressor requirement is compounding this problem.  Decompressive intervention would require  percutaneous nephrostomy tubes and considering patient's fragile condition, would not recommend proceeding with these just yet.  Urology will continue to follow peripherally.  Please reach out with acute changes, questions, or concerns.   Case and plan discussed with Dr. Rozanne Corners  Past 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   moderate atypia - right low back   Atypical nevus 04/04/2007   moderate to marked - right upper back (wider shave)   Atypical nevus 04/04/2007   moderate atypia - center chest (wider shave)   Atypical nevus 04/04/2007   slight atypia - right thigh   Atypical nevus 11/29/2011   mild atypia - center upper back   Atypical nevus 11/29/2011   mild atypia - center chest   Bacteremia due to Klebsiella pneumoniae 10/09/2017   Bladder cancer (HCC) dx 07/2017   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  s/p  cystectomy w/ ileal conduit urinary diversion   Candida infection    CHF (congestive heart failure) (HCC)    Colostomy in place (HCC)    since 08-08-2017-- per pt 12-25-2017 reddness around stoma   Diabetes mellitus without complication (HCC)    GERD (gastroesophageal reflux disease)    H/O hiatal hernia    History of sepsis 09/2017   dx bacteremia due to klebsiella pneumoniae,  post op intraabdominal abscess   Prostate cancer [facility]) urologist-- dr Rozanne Corners   10-02-2012 s/p  prostatectomy-- Stage T1c   RBBB    Renal disorder    pt. denies   Sleep apnea    cpap  Squamous cell carcinoma of skin 05/22/2013   left cheek - CX3 + 5FU   Wears glasses     Past Surgical History:  Past Surgical History:  Procedure Laterality Date   ABDOMINAL SURGERY     APPENDECTOMY  1972   BOWEL RESECTION N/A 04/20/2024   Procedure: SMALL BOWEL RESECTION;  Surgeon: Aldean Hummingbird, MD;  Location: [facility];  Service: General;  Laterality: N/A;   CHOLECYSTECTOMY   1985   COLONOSCOPY N/A 06/29/2021   Procedure: COLONOSCOPY;  Surgeon: Joyce Nixon, MD;  Location: [facility];  Service: Endoscopy;  Laterality: N/A;   COLOSTOMY REVERSAL N/A 01/08/2018   Procedure: COLOSTOMY REVERSAL;  Surgeon: Joyce Nixon, MD;  Location: [facility];  Service: General;  Laterality: N/A;   CYSTOSCOPY WITH RETROGRADE PYELOGRAM, URETEROSCOPY AND STENT PLACEMENT Right 06/10/2017   Procedure: CYSTOSCOPY WITH RIGHT URETEROSCOPY WITH RIGHT STENT PLACEMENT;  Surgeon: Florencio Hunting, MD;  Location: [facility];  Service: Urology;  Laterality: Right;   EUS N/A 04/16/2018   Procedure: FULL UPPER ENDOSCOPIC ULTRASOUND (EUS) RADIAL;  Surgeon: Evangeline Hilts, MD;  Location: [facility];  Service: Endoscopy;  Laterality: N/A;   FLEXIBLE SIGMOIDOSCOPY N/A 12/13/2017   Procedure: FLEXIBLE SIGMOIDOSCOPY;  Surgeon: Joyce Nixon, MD;  Location: [facility];  Service: Endoscopy;  Laterality: N/A;   ILEO CONDUIT     IR CATHETER TUBE CHANGE  12/13/2017   IR CATHETER TUBE CHANGE  01/17/2018   IR CATHETER TUBE CHANGE  02/28/2018   IR CATHETER TUBE CHANGE  07/18/2018   IR CATHETER TUBE CHANGE  08/22/2018   IR CATHETER TUBE CHANGE  10/31/2018   IR CONVERT LEFT NEPHROSTOMY TO NEPHROURETERAL CATH  10/24/2017   IR EXT NEPHROURETERAL CATH EXCHANGE  05/19/2018   IR EXT NEPHROURETERAL CATH EXCHANGE  06/16/2018   IR NEPHRO TUBE REMOV/FL  10/24/2017   IR NEPHROSTOGRAM LEFT THRU EXISTING ACCESS  12/05/2018   IR NEPHROSTOMY PLACEMENT LEFT  10/07/2017   IR NEPHROSTOMY TUBE CHANGE  04/11/2018   LAPAROTOMY N/A 04/20/2024   Procedure: EXPLORATORY LAPAROTOMY;  Surgeon: Aldean Hummingbird, MD;  Location: [facility];  Service: General;  Laterality: N/A;   PARTIAL COLECTOMY N/A 04/21/2024   Procedure: COLECTOMY, PARTIAL;  Surgeon: Candyce Champagne, MD;  Location: [facility];  Service: General;  Laterality: N/A;  Removal Wound Vac, Washout Ostomy, Possible Anastomosis, Possible Ileostomy.  Phasix Mesh.   POLYPECTOMY  06/29/2021   Procedure:  POLYPECTOMY;  Surgeon: Joyce Nixon, MD;  Location: [facility];  Service: Endoscopy;;   ROBOT ASSISTED LAPAROSCOPIC RADICAL PROSTATECTOMY  10/02/2012   Procedure: ROBOTIC ASSISTED LAPAROSCOPIC RADICAL PROSTATECTOMY LEVEL 2;  Surgeon: [name], MD;  Location: [facility];  Service: Urology;  Laterality: N/A;   ROBOTIC ASSISTED LAPAROSCOPIC BLADDER DIVERTICULECTOMY N/A 08/08/2017   Procedure: XI ROBOTIC ASSISTED LAPAROSCOPIC RADICAL CYSTECTOMY COVERTED TO OPEN PELVIC LYMPHADNECTOMY BILATERAL AND ILEAL CONDUIT URINARY DIVERSION;  Surgeon: Florencio Hunting, MD;  Location: [facility];  Service: Urology;  Laterality: N/A;   TRANSURETHRAL RESECTION OF BLADDER TUMOR  06/10/2017   Procedure: TRANSURETHRAL RESECTION OF BLADDER TUMOR (TURBT);  Surgeon: Florencio Hunting, MD;  Location: [facility];  Service: Urology;;   VACUUM ASSISTED CLOSURE CHANGE N/A 04/20/2024   Procedure: PLACEMENT OF Gabriel John;  Surgeon: Aldean Hummingbird, MD;  Location: [facility];  Service: General;  Laterality: N/A;   VENTRAL HERNIA REPAIR N/A 04/10/2024   Procedure: REPAIR, HERNIA, VENTRAL;  Surgeon: Candyce Champagne, MD;  Location: [facility];  Service: General;  Laterality: N/A;   XI ROBOT ABDOMINAL PERINEAL RESECTION N/A 08/08/2017  Procedure: REPAIR OF RECTAL TEAR POSSIBLE PARTIAL PROCTECTOMY, CREATION OF  OSTOMY;  Surgeon: Joyce Nixon, MD;  Location: [facility];  Service: General;  Laterality: N/A;   XI ROBOTIC ASSISTED PARASTOMAL HERNIA REPAIR N/A 04/10/2024   Procedure: REPAIR, HERNIA, PARASTOMAL AND VENTRAL HERNIAS, ROBOT-ASSISTED, LEFT INGUINAL HERNIA, LYSIS OF ADHESIONS INCARCERATED AND INSIONAL HERNIAS AND UROSTOMY REVISION;  Surgeon: Candyce Champagne, MD;  Location: [facility];  Service: General;  Laterality: N/A;    Medication: Current Facility-Administered Medications  Medication Dose Route Frequency Provider Last Rate Last Admin   0.9 %  sodium chloride  infusion (Manually program via Guardrails IV Fluids)   Intravenous Once Candyce Champagne, MD       0.9 %   sodium chloride  infusion  250 mL Intravenous PRN Candyce Champagne, MD 10 mL/hr at 04/21/24 1257 Restarted at 04/21/24 1257   Chlorhexidine  Gluconate Cloth 2 % PADS 6 each  6 each Topical Q2200 Candyce Champagne, MD   6 each at 04/22/24 0400   docusate (COLACE) 50 MG/5ML liquid 100 mg  100 mg Per Tube BID Candyce Champagne, MD       famotidine (PEPCID) IVPB 20 mg premix  20 mg Intravenous Q12H Gross, Steven, MD   Stopped at 04/22/24 0955   feeding supplement (VITAL HIGH PROTEIN) liquid 1,000 mL  1,000 mL Per Tube Q24H Candyce Champagne, MD   Infusion Verify at 04/22/24 1000   fentaNYL  (SUBLIMAZE ) injection 25-50 mcg  25-50 mcg Intravenous Once Candyce Champagne, MD       fentaNYL  in NS (14mcg/ml) infusion-PREMIX  0-400 mcg/hr Intravenous Continuous Candyce Champagne, MD 30 mL/hr at 04/22/24 1000 300 mcg/hr at 04/22/24 1000   heparin  10,000 units/ 20 mL infusion syringe  500-1,000 Units/hr CRRT Continuous Clementine Cutting, MD       heparin  injection 1,000-6,000 Units  1,000-6,000 Units CRRT PRN Clementine Cutting, MD       hydrocortisone  sodium succinate (SOLU-CORTEF ) 100 MG injection 50 mg  50 mg Intravenous Q6H Gross, Steven, MD   50 mg at 04/22/24 0651   insulin  aspart (novoLOG ) injection 0-20 Units  0-20 Units Subcutaneous Q4H Gross, Steven, MD   7 Units at 04/22/24 0757   insulin  glargine-yfgn (SEMGLEE) injection 20 Units  20 Units Subcutaneous Daily Dewald, Jonathan B, MD   20 Units at 04/22/24 0928   lactated ringers  bolus 1,000 mL  1,000 mL Intravenous Q8H PRN Candyce Champagne, MD       micafungin (MYCAMINE) 150 mg in sodium chloride  0.9 % 100 mL IVPB  150 mg Intravenous Q24H Candyce Champagne, MD 107.5 mL/hr at 04/22/24 1001 150 mg at 04/22/24 1001   norepinephrine  (LEVOPHED ) 16 mg in (0.064 mg/mL) premix infusion  0-40 mcg/min Intravenous Titrated Candyce Champagne, MD 8.44 mL/hr at 04/22/24 1000 9 mcg/min at 04/22/24 1000   Oral care mouth rinse  15 mL Mouth Rinse Renelda Carry, MD   15 mL at  04/22/24 0748   Oral care mouth rinse  15 mL Mouth Rinse PRN Gross, Landon Pinion, MD       phenylephrine  (NEO-SYNEPHRINE) 20mg /NS premix infusion  0-400 mcg/min Intravenous Titrated Candyce Champagne, MD   Stopped at 04/21/24 1025   piperacillin -tazobactam (ZOSYN ) IVPB 3.375 g  3.375 g Intravenous Q8H Gross, Steven, MD 12.5 mL/hr at 04/22/24 1000 Infusion Verify at 04/22/24 1000   polyethylene glycol (MIRALAX  / GLYCOLAX ) packet 17 g  17 g Per Tube Daily Candyce Champagne, MD       prismasol BGK 4/2.5 infusion  CRRT Continuous Clementine Cutting, MD       prismasol BGK 4/2.5 infusion   CRRT Continuous Clementine Cutting, MD       prismasol BGK 4/2.5 infusion   CRRT Continuous Clementine Cutting, MD       propofol  (DIPRIVAN ) 1000 MG/100ML infusion  0-40 mcg/kg/min Intravenous Continuous Candyce Champagne, MD 23.9 mL/hr at 04/22/24 1000 40 mcg/kg/min at 04/22/24 1000   sodium bicarbonate  150 mEq in sterile water 1,150 mL infusion   Intravenous Continuous Candyce Champagne, MD 100 mL/hr at 04/22/24 1000 Infusion Verify at 04/22/24 1000   sodium chloride  flush (NS) 0.9 % injection 10-40 mL  10-40 mL Intracatheter PRN Candyce Champagne, MD       TPN ADULT (ION)   Intravenous Continuous TPN Candyce Champagne, MD 90 mL/hr at 04/22/24 1000 Infusion Verify at 04/22/24 1000   vasopressin (PITRESSIN) 20 Units in 100 mL (0.2 unit/mL) infusion-*FOR SHOCK*  0-0.04 Units/min Intravenous Continuous Candyce Champagne, MD 12 mL/hr at 04/22/24 1000 0.04 Units/min at 04/22/24 1000    Allergies: Allergies  Allergen Reactions   Demerol  [Meperidine ] Nausea And Vomiting and Other (See Comments)    SEVERE NAUSEA    Social History: Social History   Tobacco Use   Smoking status: Former    Current packs/day: 0.00    Types: Cigarettes    Start date: 12/24/1970    Quit date: 12/25/1979    Years since quitting: 44.3   Smokeless tobacco: Never  Vaping Use   Vaping status: Never Used  Substance Use Topics   Alcohol  use: Yes     Comment: rare   Drug use: No    Family History Family History  Problem Relation Age of Onset   Lung cancer Mother    Hypertension Father    Colon cancer Other    CAD Neg Hx    Diabetes Neg Hx    Stroke Neg Hx     ROS   Objective   Vital signs in last 24 hours: BP (!) 95/49 (BP Location: Left Leg)   Pulse 66   Temp (!) 97.5 F (36.4 C)   Resp (!) 25   Ht 5' 11 (1.803 m)   Wt 112.1 kg   SpO2 95%   BMI 34.47 kg/m   Physical Exam General: Critical condition, intubated and sedated Pulmonary: Ventilator dependent Cardiovascular: Vasopressor dependent.  Stable Abdomen: Right side ostomy, urostomy, and midline abdominal incision with wound VAC in place GU: RLQ urostomy in place draining clear yellow urine Neuro: UTA  Most Recent Labs: Lab Results  Component Value Date   WBC 27.6 (H) 04/22/2024   HGB 8.8 (L) 04/22/2024   HCT 27.2 (L) 04/22/2024   PLT 283 04/22/2024    Lab Results  Component Value Date   NA 136 04/22/2024   K 5.1 04/22/2024   CL 103 04/22/2024   CO2 21 (L) 04/22/2024   BUN 80 (H) 04/22/2024   CREATININE 3.70 (H) 04/22/2024   CALCIUM  6.2 (LL) 04/22/2024   MG 2.3 04/22/2024   PHOS 8.5 (H) 04/22/2024    Lab Results  Component Value Date   INR 1.6 (H) 04/14/2024   APTT 47 (H) 04/13/2024     Urine Culture: @[id number] (laburin,org,[id number],[id number])@   IMAGING: No results found.  ------  Alla Ar, NP Pager: [phone]   Please contact the urology consult pager with any further questions/concerns.

## 2024-04-22 NOTE — Progress Notes (Signed)
 NAME:  Kristopher Thompson, MRN:  [id number], DOB:  1951-06-10, LOS: 12 ADMISSION DATE:  04/10/2024, CONSULTATION DATE:  04/13/2024 REFERRING MD:  Dr Bonita Bussing, CHIEF COMPLAINT: Sepsis  History of Present Illness:  S/p repair of incisional/parastomal and ventral hernias, left inguinal incarcerated , abdominal hernias, lysis of adhesions, urostomy revision Day 1 postoperatively  Transferred to the ICU secondary to tachycardia, tachypnea  History of bladder cancer, prostate cancer, diastolic heart failure, diabetes, right bundle branch block, multiple abdominal surgeries  Update 04/20/24: ccm reconsulted post operatively after pt had perforated bowel and returned to OR. Pt is requiring neo and norepi at this time. He remains intubated at this time. Profoundly acidotic with bicarb of 12. Will start sodium bicarb infusion and bolus prior to initiation. Follow labs in am. Updated wife and son at bedside.   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   moderate atypia - right low back   Atypical nevus 04/04/2007   moderate to marked - right upper back (wider shave)   Atypical nevus 04/04/2007   moderate atypia - center chest (wider shave)   Atypical nevus 04/04/2007   slight atypia - right thigh   Atypical nevus 11/29/2011   mild atypia - center upper back   Atypical nevus 11/29/2011   mild atypia - center chest   Bacteremia due to Klebsiella pneumoniae 10/09/2017   Bladder cancer (HCC) dx 07/2017   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  s/p  cystectomy w/ ileal conduit urinary diversion   Candida infection    CHF (congestive heart failure) (HCC)    Colostomy in place (HCC)    since 08-08-2017-- per pt 12-25-2017 reddness around stoma   Diabetes mellitus without complication (HCC)    GERD (gastroesophageal reflux disease)    H/O hiatal hernia    History of  sepsis 09/2017   dx bacteremia due to klebsiella pneumoniae,  post op intraabdominal abscess   Prostate [facility] urologist-- dr Rozanne Corners   10-02-2012 s/p  prostatectomy-- Stage T1c   RBBB    Renal disorder    pt. denies   Sleep apnea    cpap   Squamous cell carcinoma of skin 05/22/2013   left cheek - CX3 + 5FU   Wears glasses    Significant Hospital Events: Including procedures, antibiotic start and stop dates in addition to other pertinent events   04/10/2024-laparoscopic surgery 04/13/24 CCM consult. Incr NE. Art line  6/3 weaning NE. Gently diuresed w 3L off  6/4 off NE. Pulled out his NGT refused replacement. After multiple emesis  6/5 cont NGT 6/6 tx to med surg 6/9: taken back to OR for perforated bowel, post-op shock 6/10: Norepinephrine , Phenylephrine , and vasopressin, added steroids, 1u PRBCs, back to OR for washout and closure 6/11: Worsening AKI with fluid overload, start CRRT, decreasing pressor requirements,   Interim History / Subjective:  Heavily sedated    Objective    Blood pressure (!) 114/49, pulse (!) 57, temperature (!) 97.3 F (36.3 C), resp. rate (!) 25, height 5' 11 (1.803 m), weight 112.1 kg, SpO2 97%. CVP:  [8 mmHg-30 mmHg] 17 mmHg  Vent Mode: PRVC FiO2 (%):  [40 %] 40 % Set Rate:  [18 bmp-25 bmp] 25 bmp Vt Set:  [600 mL] 600 mL PEEP:  [5 cmH20-8 cmH20] 8 cmH20 Plateau Pressure:  [  15 cmH20-22 cmH20] 22 cmH20   Intake/Output Summary (Last 24 hours) at 04/22/2024 0733 Last data filed at 04/22/2024 0500 Gross per 24 hour  Intake 10547.92 ml  Output 1315 ml  Net 9232.92 ml   Filed Weights   04/18/24 0521 04/19/24 0500 04/20/24 0500  Weight: 99.5 kg 99.5 kg 112.1 kg    Examination: General: acutely ill appearing, orally intubated, sedated Neuro: heavily sedated, pupils pinpoint and reactive to light HEENT: normocephalic, orally intubated, NGT in place, eyes are non-icteric RESP: regular rate and rhythm, full vent support, clear to  auscultation CV: sinus bradycardia, regular rhythm, +2 pitting edema in all extremities, palpable pulses in all GI: distant but present bowel sounds, abdomen distended but soft, colostomy present in RUQ with dark brown output and wound vac covering abdominal incision site, x2 JP drains in place with serosanguinous output  GU: urostomy with no urine present Skin: warm and dry, abdominal wound covered with wound vac   Resolved problem list    Assessment and Plan   Septic shock due to acute bowel perforation S/P OR for bowel repair left in discontinuity, return to OR 6/10 for closure and ileal resection and end ileostomy creation Likely caused by delayed ileal leak  Incarcerated abd wall hernia a/p LOA hernia repairs urostomy ileal conduit revision  Hypotension now potentially due to sedation as well Plan Zosyn , Micafungin day 2 of x (LOT TBD) Abdominal drains per surgery: wound vac and 2x JP drains Mag goal >65 Levophed , vasopressin Continue stress dose steroids day 2 Blood cultures pending, no growth thus far trend CBC Start trickle feeds  AKI w/ Severe Non-anion gap Metabolic Acidosis; and hyperkalemia and hypocalcemia  -? GI loss. Placed on bicarb. Hypocalcemia (corrects to 7.7 w/albumin ) plan CVP rising; 15-25 Plan Cont bicarb  Trend CMP and serial chems Consult Nephrology; CRRT  Acute Hypoxemic and hypercarbic Respiratory Failure; intubated Hx OSA on CPAP, c/b dec'd abd compliance w/ post op atx and volume overload plan Continue mechanical ventilatory support VAP prevention Sedation and pain control; prop and fentanyl  Decrease fentanyl  to 150  Use propofol  for RASS goal -2  Hyperglycemia Hx of diabetes Plan Resistant dosing for SSI Long acting dose increased to 20u Consider insulin  drip    Anemia: acute blood loss and chronic anemia R flank hematoma  Hx PE on eliquis  plan Heparin  drip for hx of PE; paused for line placement monitor for bleeding Trend  CBC  H/o CHF  ECHO with EF 65-70% CVP 15-20 plan Trend CVP Monitor I/O balance   Moderate Protein Calorie Malnutrition Hypoalbuminemia plan continue TPN Start trickle feeds, monitor for high residuals   Best Practice (right click and Reselect all SmartList Selections daily)   Diet/type: NPO on tpn DVT prophylaxis SCD heparin  to restart post line placement  Pressure ulcer(s): N/A GI prophylaxis: Pepcid N/A Lines: Central line, PICC art line  Foley:  N/A urostomy  Code Status:  full code Last date of multidisciplinary goals of care discussion [pending per primary team]     Critical care time: per attending    Note reviewed w/ A Pate S-NP  Hadley Leu ACNP-BC [facility] Pulmonary/Critical Care Pager # [phone] OR # [phone] if no answer

## 2024-04-22 NOTE — Progress Notes (Signed)
 PHARMACY - TOTAL PARENTERAL NUTRITION CONSULT NOTE   Indication: Prolonged ileus  Patient Measurements: Height: 5' 11 (180.3 cm) Weight: 112.1 kg (247 lb 2.2 oz) IBW/kg (Calculated) : 75.3 TPN AdjBW (KG): 83.7 Body mass index is 34.47 kg/m.  Assessment: Pt is a 51 yoM who underwent hernia repair and urostomy revision on 04/10/24. Pt developed post-op ileus. Pharmacy consulted to manage TPN, which was started on 6/2. Patient required return to OR early 6/9 am for perforated bowel s/p drainage of abscesses, small bowel resection (in discontinuity) with placement of wound VAC. He returned to the ICU intubated and requiring vasopressor support. He remained on 3 vasopressors overnight, stress dose steroids added early 6/10. On 6/10, able to stop phenylephrine . Patient returned to OR and is now s/p abdominal wall debridement, ileal resection, end ileostomy, hernia repairs and fascia closure w/ placement of wound vac. He was started on trickle feeds with plan to hopefully advance with improvement in vasopressor needs. He remains on TPN pending ability to advance tube feeds.  Glucose / Insulin : Hx DM on metformin PTA. A1c 5.3% -CBGs previously reasonably controlled; now consistently elevated > 200 -Semglee 20 units -66 units of SSI used in last 24hrs -Stress dose steroids started 6/10 am -Decreasing dextrose  ~ 1/3 (324 g > 216 g) Electrolytes:  -Na WNL -Phos 8.5 - not surprising given decreased renal function -K 5.8 > 5.1 - no K in TPN since 6/9 -CorrCa 7.7 - low, received Ca gluc 2 g x 2 Renal: SCr 3.7 (worsening, plan for CRRT); BUN 80 (trending up)  Hepatic: albumin  2.1; LFTs, alk phos WNL Intake / Output; MIVF:  -UOP only 220 ml documented yesterday, none so far today with plan for CRRT -NG/emesis 60 ml this am -Drain ~ 600 ml so far today -Stool last charted on 6/6, having some bowel function per notes GI Imaging: -6/9: Air-fluid collection in the subcutaneous soft tissues of the right  anterior pelvic wall extending from the midline to the ileostomy -6/3: Right lower quadrant small bowel loop with mural defect and adjacent extraluminal gas identified, which is concerning for underlying small bowel perforation. GI Surgeries / Procedures:  -6/10: Abdominal wall debridement, ileal resection, end ileostomy, hernia repairs, fascia closure w/ placement of wound vac -6/9: Perforated bowel - drainage of right abdominal wall abscess as well as intra-abdominal abscess, explantation of abdominal mesh, small bowel resection (in discontinuity), placement of wound VAC -5/30: Hernia repair, lysis of adhesions, urostomy revision  Central access: PICC TPN start date: 6/2   Nutritional Goals:  Goal TPN rate is 90 mL/hr (provides 125 g of protein and 1235 kcals per day, with an additional ~ 576 kcal from propofol ) Tube feeds at 20 ml/hr provides an additional 42 g protein and 480 kcal This will give a total of 167 g protein and 2291 kcals per day meeting 100% of patient's increased needs to account for CRRT and multiple abdominal surgeries  RD Assessment: Estimated Needs Total Energy Estimated Needs: 2100-2300 kcals Total Protein Estimated Needs: 155-180 grams Total Fluid Estimated Needs: >/= 2.1L  Current Nutrition:  TPN  Trickle tube feeds  Plan: Calcium  gluconate 2 g IV x 2 given overnight Continue TPN at goal 90 mL/hr Continue without lipids as pt is receiving ~ 576 kcal lipids from propofol  at current rate Dextrose  reduced by ~ 1/3 of previous needs Electrolytes in TPN:  Na 50 mEq/L K 0 mEq/L  Ca 5 mEq/L Mg 4 mEq/L   Phos 0 mmol/L - will remove for  today but anticipate need to add back next 24-48 hrs with CRRT Cl:Ac 1:1 Add standard MVI and trace elements to TPN Continue with Semglee 20 units daily + resistant SSI q4hr. Defer any further changes for today in light of decrease in dextrose  content as noted above Monitor TPN labs on Mon/Thurs at minimum   Lolita Rise,  PharmD, BCPS Clinical Pharmacist 04/22/2024 9:48 AM

## 2024-04-23 DIAGNOSIS — A419 Sepsis, unspecified organism: Secondary | ICD-10-CM | POA: Diagnosis not present

## 2024-04-23 DIAGNOSIS — R6521 Severe sepsis with septic shock: Secondary | ICD-10-CM | POA: Diagnosis not present

## 2024-04-23 DIAGNOSIS — K430 Incisional hernia with obstruction, without gangrene: Secondary | ICD-10-CM | POA: Diagnosis not present

## 2024-04-23 DIAGNOSIS — E8721 Acute metabolic acidosis: Secondary | ICD-10-CM | POA: Diagnosis not present

## 2024-04-23 LAB — CBC
HCT: 24.7 % — ABNORMAL LOW (ref 39.0–52.0)
Hemoglobin: 8 g/dL — ABNORMAL LOW (ref 13.0–17.0)
MCH: 29 pg (ref 26.0–34.0)
MCHC: 32.4 g/dL (ref 30.0–36.0)
MCV: 89.5 fL (ref 80.0–100.0)
Platelets: 237 10*3/uL (ref 150–400)
RBC: 2.76 MIL/uL — ABNORMAL LOW (ref 4.22–5.81)
RDW: 15.3 % (ref 11.5–15.5)
WBC: 17 10*3/uL — ABNORMAL HIGH (ref 4.0–10.5)
nRBC: 0.1 % (ref 0.0–0.2)

## 2024-04-23 LAB — COMPREHENSIVE METABOLIC PANEL WITH GFR
ALT: 13 U/L (ref 0–44)
AST: 21 U/L (ref 15–41)
Albumin: 1.7 g/dL — ABNORMAL LOW (ref 3.5–5.0)
Alkaline Phosphatase: 46 U/L (ref 38–126)
Anion gap: 10 (ref 5–15)
BUN: 68 mg/dL — ABNORMAL HIGH (ref 8–23)
CO2: 24 mmol/L (ref 22–32)
Calcium: 6.8 mg/dL — ABNORMAL LOW (ref 8.9–10.3)
Chloride: 101 mmol/L (ref 98–111)
Creatinine, Ser: 2.99 mg/dL — ABNORMAL HIGH (ref 0.61–1.24)
GFR, Estimated: 22 mL/min — ABNORMAL LOW (ref >60)
Glucose, Bld: 231 mg/dL — ABNORMAL HIGH (ref 70–99)
Potassium: 4.4 mmol/L (ref 3.5–5.1)
Sodium: 135 mmol/L (ref 135–145)
Total Bilirubin: 0.7 mg/dL (ref 0.0–1.2)
Total Protein: 4.9 g/dL — ABNORMAL LOW (ref 6.5–8.1)

## 2024-04-23 LAB — GLUCOSE, CAPILLARY
Glucose-Capillary: 223 mg/dL — ABNORMAL HIGH (ref 70–99)
Glucose-Capillary: 232 mg/dL — ABNORMAL HIGH (ref 70–99)
Glucose-Capillary: 212 mg/dL — ABNORMAL HIGH (ref 70–99)
Glucose-Capillary: 194 mg/dL — ABNORMAL HIGH (ref 70–99)
Glucose-Capillary: 168 mg/dL — ABNORMAL HIGH (ref 70–99)

## 2024-04-23 LAB — RENAL FUNCTION PANEL
Albumin: 1.8 g/dL — ABNORMAL LOW (ref 3.5–5.0)
Albumin: 1.9 g/dL — ABNORMAL LOW (ref 3.5–5.0)
Anion gap: 12 (ref 5–15)
Anion gap: 13 (ref 5–15)
BUN: 68 mg/dL — ABNORMAL HIGH (ref 8–23)
BUN: 67 mg/dL — ABNORMAL HIGH (ref 8–23)
CO2: 23 mmol/L (ref 22–32)
CO2: 24 mmol/L (ref 22–32)
Calcium: 6.7 mg/dL — ABNORMAL LOW (ref 8.9–10.3)
Calcium: 6.9 mg/dL — ABNORMAL LOW (ref 8.9–10.3)
Chloride: 99 mmol/L (ref 98–111)
Chloride: 100 mmol/L (ref 98–111)
Creatinine, Ser: 2.85 mg/dL — ABNORMAL HIGH (ref 0.61–1.24)
Creatinine, Ser: 2.71 mg/dL — ABNORMAL HIGH (ref 0.61–1.24)
GFR, Estimated: 23 mL/min — ABNORMAL LOW (ref >60)
GFR, Estimated: 24 mL/min — ABNORMAL LOW (ref >60)
Glucose, Bld: 235 mg/dL — ABNORMAL HIGH (ref 70–99)
Glucose, Bld: 205 mg/dL — ABNORMAL HIGH (ref 70–99)
Phosphorus: 5.4 mg/dL — ABNORMAL HIGH (ref 2.5–4.6)
Phosphorus: 4.2 mg/dL (ref 2.5–4.6)
Potassium: 4.3 mmol/L (ref 3.5–5.1)
Potassium: 4 mmol/L (ref 3.5–5.1)
Sodium: 134 mmol/L — ABNORMAL LOW (ref 135–145)
Sodium: 137 mmol/L (ref 135–145)

## 2024-04-23 LAB — MAGNESIUM: Magnesium: 2.4 mg/dL (ref 1.7–2.4)

## 2024-04-23 LAB — CREATININE, FLUID (PLEURAL, PERITONEAL, JP DRAINAGE): Creat, Fluid: 2.9 mg/dL

## 2024-04-23 LAB — PHOSPHORUS: Phosphorus: 5.2 mg/dL — ABNORMAL HIGH (ref 2.5–4.6)

## 2024-04-23 LAB — CULTURE, RESPIRATORY W GRAM STAIN
Culture: NORMAL
Gram Stain: NONE SEEN

## 2024-04-23 LAB — HEPARIN LEVEL (UNFRACTIONATED): Heparin Unfractionated: 0.56 [IU]/mL (ref 0.30–0.70)

## 2024-04-23 LAB — APTT: aPTT: 37 s — ABNORMAL HIGH (ref 24–36)

## 2024-04-23 LAB — SURGICAL PATHOLOGY

## 2024-04-23 MED ORDER — HEPARIN (PORCINE) 25000 UT/250ML-% IV SOLN
1900.0000 [IU]/h | INTRAVENOUS | Status: DC
  Administered 2024-04-23 – 2024-04-26 (×5): 1700 [IU]/h via INTRAVENOUS
  Administered 2024-04-26 – 2024-05-03 (×11): 1900 [IU]/h via INTRAVENOUS
  Filled 2024-04-23 (×18): qty 250

## 2024-04-23 MED ORDER — INSULIN GLARGINE-YFGN 100 UNIT/ML ~~LOC~~ SOLN
40.0000 [IU] | Freq: Every day | SUBCUTANEOUS | Status: DC
  Administered 2024-04-23 – 2024-04-24 (×2): 40 [IU] via SUBCUTANEOUS
  Filled 2024-04-23 (×2): qty 0.4

## 2024-04-23 MED ORDER — POLYVINYL ALCOHOL 1.4 % OP SOLN
1.0000 [drp] | OPHTHALMIC | Status: DC | PRN
  Filled 2024-04-23: qty 15

## 2024-04-23 MED ORDER — ALBUMIN HUMAN 5 % IV SOLN
25.0000 g | Freq: Once | INTRAVENOUS | Status: AC
  Administered 2024-04-23: 25 g via INTRAVENOUS
  Filled 2024-04-23: qty 500

## 2024-04-23 MED ORDER — HYDROCORTISONE SOD SUC (PF) 100 MG IJ SOLR
50.0000 mg | Freq: Two times a day (BID) | INTRAMUSCULAR | Status: DC
  Administered 2024-04-23 – 2024-04-27 (×8): 50 mg via INTRAVENOUS
  Filled 2024-04-23 (×8): qty 2

## 2024-04-23 MED ORDER — VITAL HIGH PROTEIN PO LIQD
1000.0000 mL | ORAL | Status: DC
  Administered 2024-04-23 (×2): 1000 mL

## 2024-04-23 MED ORDER — MIDAZOLAM HCL 2 MG/2ML IJ SOLN
1.0000 mg | INTRAMUSCULAR | Status: DC | PRN
  Administered 2024-04-23 – 2024-04-25 (×5): 2 mg via INTRAVENOUS
  Filled 2024-04-23 (×5): qty 2

## 2024-04-23 MED ORDER — INSULIN ASPART 100 UNIT/ML IJ SOLN
3.0000 [IU] | INTRAMUSCULAR | Status: DC
  Administered 2024-04-23 – 2024-04-24 (×5): 3 [IU] via SUBCUTANEOUS

## 2024-04-23 MED ORDER — DEXMEDETOMIDINE HCL IN NACL 400 MCG/100ML IV SOLN
0.0000 ug/kg/h | INTRAVENOUS | Status: DC
  Administered 2024-04-23 (×2): 0.6 ug/kg/h via INTRAVENOUS
  Administered 2024-04-24: 0.5 ug/kg/h via INTRAVENOUS
  Administered 2024-04-24: 0.6 ug/kg/h via INTRAVENOUS
  Administered 2024-04-24: 0.3 ug/kg/h via INTRAVENOUS
  Administered 2024-04-25: 0.5 ug/kg/h via INTRAVENOUS
  Administered 2024-04-25: 0.3 ug/kg/h via INTRAVENOUS
  Filled 2024-04-23 (×7): qty 100

## 2024-04-23 MED ORDER — VITAL HIGH PROTEIN PO LIQD: 1000.0000 mL | ORAL | Status: DC

## 2024-04-23 MED ORDER — FENTANYL CITRATE PF 50 MCG/ML IJ SOSY
25.0000 ug | PREFILLED_SYRINGE | INTRAMUSCULAR | Status: AC | PRN
  Administered 2024-04-23 (×2): 50 ug via INTRAVENOUS
  Administered 2024-04-23: 100 ug via INTRAVENOUS
  Administered 2024-04-24: 25 ug via INTRAVENOUS
  Administered 2024-04-24: 100 ug via INTRAVENOUS
  Administered 2024-04-24: 50 ug via INTRAVENOUS
  Administered 2024-04-25: 25 ug via INTRAVENOUS
  Administered 2024-04-25: 50 ug via INTRAVENOUS
  Administered 2024-04-25: 25 ug via INTRAVENOUS
  Administered 2024-04-25: 100 ug via INTRAVENOUS
  Filled 2024-04-23 (×3): qty 1
  Filled 2024-04-23: qty 2
  Filled 2024-04-23: qty 1
  Filled 2024-04-23: qty 2
  Filled 2024-04-23: qty 1
  Filled 2024-04-23: qty 2
  Filled 2024-04-23 (×2): qty 1

## 2024-04-23 MED ORDER — TRAVASOL 10 % IV SOLN
INTRAVENOUS | Status: AC
  Filled 2024-04-23: qty 1008

## 2024-04-23 NOTE — Progress Notes (Signed)
 PHARMACY - ANTICOAGULATION CONSULT NOTE  Pharmacy Consult for IV heparin  Indication: pulmonary embolus  Allergies  Allergen Reactions   Demerol  [Meperidine ] Nausea And Vomiting and Other (See Comments)    SEVERE NAUSEA    Patient Measurements: Height: 5' 11 (180.3 cm) Weight: 129 kg (284 lb 6.3 oz) IBW/kg (Calculated) : 75.3 HEPARIN  DW (KG): 98.5  Vital Signs: Temp: 98.3 F (36.8 C) (06/12 1208) Temp Source: Axillary (06/12 1208) BP: 119/60 (06/12 0432) Pulse Rate: 90 (06/12 1226)  Labs: Recent Labs    04/20/24 2345 04/21/24 0342 04/21/24 1715 04/21/24 2347 04/22/24 0341 04/22/24 1604 04/23/24 0416  HGB  --    < > 7.8* 8.8* 8.8*  --  8.0*  HCT  --    < > 26.5* 27.6* 27.2*  --  24.7*  PLT  --    < > 281  --  283  --  237  APTT  --   --   --   --   --   --  37*  HEPARINUNFRC 0.34  --   --   --   --   --   --   CREATININE  --    < > 3.13*  --  3.70* 3.64* 2.85*  2.99*   < > = values in this interval not displayed.    Estimated Creatinine Clearance: 31 mL/min (A) (by C-G formula based on SCr of 2.99 mg/dL (H)).   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   moderate atypia - right low back   Atypical nevus 04/04/2007   moderate to marked - right upper back (wider shave)   Atypical nevus 04/04/2007   moderate atypia - center chest (wider shave)   Atypical nevus 04/04/2007   slight atypia - right thigh   Atypical nevus 11/29/2011   mild atypia - center upper back   Atypical nevus 11/29/2011   mild atypia - center chest   Bacteremia due to Klebsiella pneumoniae 10/09/2017   Bladder cancer (HCC) dx 07/2017   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 denies   Sleep apnea    cpap   Squamous cell carcinoma of skin 05/22/2013   left cheek - CX3 + 5FU   Wears glasses     Medications: Apixaban PTA, LD 5/27  Assessment: Pharmacy is consulted to dose heparin  on 04/17/24 for 73 yo male with PMH that includes PE. On apixaban PTA, which has not been resumed as inpatient. Was receiving enoxaparin  for DVT ppx post-op.  Heparin  was initially resumed but then on and off following need to return to OR. Discussed with CCM today, okay to resume heparin .  Patient is now on CRRT.  Today, 04/23/24  -Plan to resume therapeutic heparin  -  Hgb low but overall stable, plt WNL -Heparin  in CRRT circuit at 750 units/hr  Goal of Therapy:  No bolus  Heparin  level 0.3-0.7 units/ml Monitor platelets by anticoagulation protocol: Yes   Plan:  -Resume heparin  at 1700 units/hr (decreased from previously therapeutic rate given worsening renal function since pt last on heparin ) -Check heparin  level 8 hours after infusion started -May be able to stop heparin  via CRRT circuit once systemic heparin  becomes therapeutic -Daily heparin  level and CBC   Lolita Rise, PharmD, BCPS Clinical Pharmacist 04/23/2024 1:49 PM

## 2024-04-23 NOTE — Progress Notes (Signed)
 PHARMACY - ANTICOAGULATION CONSULT NOTE  Pharmacy Consult for IV heparin  Indication: pulmonary embolus  Allergies  Allergen Reactions   Demerol  [Meperidine ] Nausea And Vomiting and Other (See Comments)    SEVERE NAUSEA    Patient Measurements: Height: 5' 11 (180.3 cm) Weight: 129 kg (284 lb 6.3 oz) IBW/kg (Calculated) : 75.3 HEPARIN  DW (KG): 98.5  Vital Signs: Temp: 98 F (36.7 C) (06/12 1900) Temp Source: Axillary (06/12 1900) Pulse Rate: 63 (06/12 2200)  Labs: Recent Labs    04/20/24 2345 04/21/24 0342 04/21/24 1715 04/21/24 2347 04/22/24 0341 04/22/24 1604 04/23/24 0416 04/23/24 1600 04/23/24 1937  HGB  --    < > 7.8* 8.8* 8.8*  --  8.0*  --   --   HCT  --    < > 26.5* 27.6* 27.2*  --  24.7*  --   --   PLT  --    < > 281  --  283  --  237  --   --   APTT  --   --   --   --   --   --  37*  --   --   HEPARINUNFRC 0.34  --   --   --   --   --   --   --  0.56  CREATININE  --    < > 3.13*  --  3.70* 3.64* 2.85*  2.99* 2.71*  --    < > = values in this interval not displayed.    Estimated Creatinine Clearance: 34.2 mL/min (A) (by C-G formula based on SCr of 2.71 mg/dL (H)).   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   moderate atypia - right low back   Atypical nevus 04/04/2007   moderate to marked - right upper back (wider shave)   Atypical nevus 04/04/2007   moderate atypia - center chest (wider shave)   Atypical nevus 04/04/2007   slight atypia - right thigh   Atypical nevus 11/29/2011   mild atypia - center upper back   Atypical nevus 11/29/2011   mild atypia - center chest   Bacteremia due to Klebsiella pneumoniae 10/09/2017   Bladder cancer (HCC) dx 07/2017   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 denies   Sleep apnea    cpap   Squamous cell carcinoma of skin 05/22/2013   left cheek - CX3 + 5FU   Wears glasses     Medications: Apixaban PTA, LD 5/27  Assessment: Pharmacy is consulted to dose heparin  on 04/17/24 for 73 yo male with PMH that includes PE. On apixaban PTA, which has not been resumed as inpatient. Was receiving enoxaparin  for DVT ppx post-op.  Heparin  was initially resumed but then  on and off following need to return to OR. Discussed with CCM today, okay to resume heparin .  Patient is now on CRRT.  Today, 04/23/24  -Plan to resume therapeutic heparin  -Hgb low but overall stable, plt WNL -Heparin  in CRRT circuit at 750 units/hr  1st HL 0.56 therapeutic on 1700 units/hr No complications of therapy noted  Goal of Therapy:  No bolus  Heparin  level 0.3-0.7 units/ml Monitor platelets by anticoagulation protocol: Yes   Plan:  -continue heparin  drip at 1700 units/hr -Confirmatory heparin  level in 8 hours -May be able to stop heparin  via CRRT circuit once systemic heparin  becomes therapeutic -Daily heparin  level and CBC   Beau Bound RPh 04/23/2024, 11:15 PM

## 2024-04-23 NOTE — Progress Notes (Addendum)
 04/23/2024  Kristopher Thompson [id number] [birth year]  CARE TEAM: PCP: Dudley Ghee, MD  Outpatient Care Team: Patient Care Team: Dudley Ghee, MD as PCP - General (Internal Medicine) Devon Fogo, MD (Inactive) as Consulting Physician (Dermatology) Lavina Pou, MD as Referring Physician (Pulmonary Disease) Candyce Champagne, MD as Consulting Physician (General Surgery) Florencio Hunting, MD as Consulting Physician (Urology)  Inpatient Treatment Team: Treatment Team:  Candyce Champagne, MD Etter Hermann., MD Florencio Hunting, MD Pccm, Md, MD Neila Bally, RRT Clementine Cutting, MD Larayne Platter, RN David Escort, NT May, Serita Danes, RN Davene Ernst, RN Reymundo Caulk, RN   Problem List:   Principal Problem:   Incarcerated incisional hernia Active Problems:   S/P ileal conduit [facility])   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 08/08/2017   GERD (gastroesophageal reflux disease)   Obesity (BMI 35.0-39.9 without comorbidity)   Prolonged QT interval   Non-recurrent bilateral inguinal hernia without obstruction or gangrene   Sinus tachycardia   Tachypnea   Acute respiratory insufficiency, postoperative   Sepsis due to undetermined organism (HCC)   Lactic acidosis   Class 2 obesity   Chronic anticoagulation   Hearing loss   History of bladder cancer   Obstructive sleep apnea of adult   Parastomal hernia of ileal conduit   Partial small bowel obstruction (HCC)   Personal history of PE (pulmonary embolism)   04/10/2024  POST-OPERATIVE DIAGNOSIS:  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   PROCEDURE:   -ROBOTIC LYSIS OF ADHESIONS X 4 HOURS -COMPONENT SEPARATION - TRANSVERSUS ABDOMINIS REALEASE (TAR) BILATERAL -ROBOTIC REPAIRS OF  INCISIONAL, PARASTOMAL, LEFT INGUINAL  INCARCERATED ABDOMINAL WALL HERNIAS WITH MESH -UROSTOMY ILEAL CONDUIT REVISION -INTRAOPERATIVE ASSESSMENT OF TISSUE VASCULAR PERFUSION USING ICG (indocyanine  green) -IMMUNOFLUORESCENCE -TRANSVERSUS ABDOMINIS PLANE (TAP) BLOCK - BILATERAL    SURGEON:  Eddye Goodie, MD  OR FINDINGS:  Patient had dense adhesions anterior abdominal wall.  Largest hernia was in the right lower quadrant around the ileal conduit urostomy.  16 x 12 cm region.  Incarcerated with over a foot of small bowel.  Midline next largest 9 x 8 cm incarcerated with numerous loops of small bowel.  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  Another direct space hernia in the left lower quadrant and indirect hernia on the left lower quadrant.  No obvious hernias on the right side.   Component separation's done on both sides to try and get the largest midline and parastomal hernias close down with running serrated try to fix absorbable suture.  Broad underlay repair with 30 x 35 cm mesh transverse  04/20/2024 POST-OPERATIVE DIAGNOSIS:  perforated bowel; history of robotic lysis of adhesions x 4 hours, component separation-transversus abdominis release bilateral, robotic repairs of incisional, parastomal, left inguinal incarcerated abdominal wall hernias with mesh; urostomy ileal conduit revision by Dr. [name] on May 30   PROCEDURE:  Drainage of right abdominal wall abscess as well as intra-abdominal abscess Explantation of abdominal mesh Small bowel resection (in discontinuity) Placement of ABThera wound VAC   SURGEON:  Aldean Hummingbird, MD  ASSISTANTS: Adalberto Acton, MD   04/21/2024  POST-OPERATIVE DIAGNOSIS:  DELAYED BOWEL PERFORATION WITH OPEN ABDOMEN   PROCEDURE:   LYSIS OF ADHESIONS X ABDOMINAL WALL DEBRIDEMENT ILEAL RESECTION END ILEOSTOMY ABDOMINAL WALL PARASTOMAL & INCISIONAL HERNIA REPAIRS WITH PHASIX MESH FASCIA CLOSURE WITH WOUND VAC PLACEMENT IN SQ   SURGEON:  Eddye Goodie, MD  ASSISTANT:  [name], MD   Ostomy:  Right upper quadrant: End ileostomy Right lower quadrant: Urostomy ileal conduit   Drains: 19 Fr Blake drains  x2   Right lower quadrant drain rests in the right panniculus abdominal wall between the Phasix mesh and abdominal wall   Left lower quadrant drain rest in the anterior peritoneum between the mesh and the bowel with the tip resting in the right lower quadrant near the base of the ileal conduit and prior bowel resection  Assessment [facility] Stay = 13 days) 2 Days Post-Op    Critical  Plan:  Pressure support.  Levophed  weaned down a guardedly hopeful sign.  On low  Vasopressin.  Volume as needed.  Antibiotics. Zosyn Carolynne Citron 6/10-  Follow-up on intra-abdominal tissue culture.  Scant gram-negative rods on Gram stain.  Vent support.  Minimal settings a guarded sign that he is not fully is going into worsening respiratory failure.  Oliguria   Urostomy with some urine output guardedly hopeful.  CRRTx.  He is tolerating rather well without increased pressor support guardedly hopeful sign.  I am hopeful there is no urine leak but that is definitely a concern.  Discussed with Dr. Rozanne Corners with urology.  Will send left lower quadrant drain that rests in the right lower quadrant intraperitoneally for creatinine.  If that is markedly elevated, patient may require percutaneous nephrostomy to get that under control.  We will see.    Tolerating trophic feeds.  Will go up to 30mL but go slowly.  Once off pressors advance tube feeds to goal and wean TPN.  TPN to avoid malnutrition.  Change wound vac q TueThuSat  CT of chest negative for any obvious pulmonary embolism or pneumonia.  Oxygen needs less.  Echocardiogram shows grade 1 diastolic dysfunction which I believe is his baseline.  Defer to critical care/internal medicine they feel further is needed aside for some monitor diuresis.  ABLA on top of anemia of chronic disease improved with transfusion.  Follow.  CCM wanting him back on heparin  drip to avoid any clotting events since his history of PE  -monitor electrolytes & replace as  needed.  Keep K>4, Mg>2, Phos>3.    -Diabetes.  Sliding scale insulin .  Some hyperglycemia not surprising with TPN and trophic tube feeds.  Agree with adjusting to resistant sliding scale 6/10.  Poss insulin  drip if he persists - defer to CCM  -VTE prophylaxis- SCDs.  Anticoagulation prophyllaxis SQ as appropriate.  Okay to restart heparin  as long as continue to follow hemoglobin to avoid pulmonary embolus with h/o PE, CCM wished to be aggressive with hep gtt - follow Hgb & BP closely.    I updated the patient's status to the the patient's wife & ICU nurses at bedside & ICU nurse in room.  Recommendations were made.  Questions were answered.  They expressed understanding & appreciation.  -Disposition: He is going to be here a while       I reviewed nursing notes, last 24 h vitals and pain scores, last 48 h intake and output, last 24 h labs and trends, and last 24 h imaging results.  I have reviewed this patient's available data, including medical history, events of note, test results, etc as part of my evaluation.   A significant portion of that time was spent in counseling. Care during the described time interval was provided by me.  This care required high  level of medical decision making.  04/23/2024    Subjective: (Chief complaint)  Pressors weaned down to lower doses.  On CRRT with some anticoagulation.  Ileostomy with some succus output and low residuals on trophic tube feeds.  They have weaned sedation and he is moving his extremities a little bit.  They have mitts around just in case.  ICU nurse and wife in room.  Objective:  Vital signs:  Vitals:   04/23/24 0615 04/23/24 0630 04/23/24 0645 04/23/24 0700  BP:      Pulse: (!) 56 (!) 56 89 (!) 59  Resp: (!) 25 (!) 25 18 (!) 25  Temp: (!) 96.6 F (35.9 C) (!) 96.6 F (35.9 C)    TempSrc:      SpO2: 98% 98% 98% 97%  Weight:      Height:        Last BM Date : 04/17/24  Intake/Output   Yesterday:  06/11  0701 - 06/12 0700 In: 8680.1 [I.V.:6483.6; NG/GT:629; IV Piggyback:1567.5] Out: 8608 [Urine:160; Emesis/NG output:130; Drains:840; Stool:125] This shift:  No intake/output data recorded.  Bowel function:  Flatus: YES  BM:  YES -small  Drains:  RLQ drain (rests between abdominal wall and Phasix mesh): Serous    LLQ drain (runs over bowel with tip down in RLQ pelvis near base of ileal conduit and site of bowel resection):  thinly serosanguinous   Wound vac (in SQ over closed fascia):  serosanguineous  Physical Exam:  General: Pt intubated and sedated.  Not agitated Eyes: PERRL, normal EOM.  Sclera clear.  No icterus Neuro: CN II-XII intact w/o focal sensory/motor deficits. Lymph: No head/neck/groin lymphadenopathy Psych:  No delerium/psychosis/paranoia.  No agitation HENT: Normocephalic, Mucus membranes moist.  No thrush.  ETT & NGT in place Neck: Supple, No tracheal deviation.  No obvious thyromegaly Chest: No pain to chest wall compression.  Good respiratory excursion.   CV:  Pulses intact.  Regular rhythm.  1-2+ BUE/BLE edema MS:  No obvious deformity  Abdomen:  Obese but Soft.  Nondistended.  Wound vac in midline.    Flank ecchymosis bilateral posterior stable to slightly resolving.  Edema along right flank and panniculus with mild erythema stable to slightly improved.  RUQ: (End ileostomy): Molasses thick black effluent in bag low volume RLQ:  (Urostomy ileal conduit): pink with scant light tan-colored urine  Ext:   No deformity.  No mjr edema.  No cyanosis Skin: No petechiae / purpurea.  No major sores.  Warm and dry    Results:   Cultures: Recent Results (from the past 720 hours)  Culture, blood (Routine X 2) w Reflex to ID Panel     Status: None   Collection Time: 04/13/24  7:13 AM   Specimen: BLOOD  Result Value Ref Range Status   Specimen Description   Final    BLOOD BLOOD LEFT ARM AEROBIC BOTTLE ONLY ANAEROBIC BOTTLE ONLY Performed at [facility], [address], [place], [place] [address]    Special Requests   Final    BOTTLES DRAWN AEROBIC AND ANAEROBIC Blood Culture results may not be optimal due to an inadequate volume of blood received in culture bottles Performed at [facility], [address] [place], [place] [address]    Culture   Final    NO GROWTH 5 DAYS Performed at [facility], [address], [place], [place] [address]    Report Status 04/18/2024 FINAL  Final  Culture, blood (Routine X 2) w Reflex to ID Panel     Status: None  Collection Time: 04/13/24  7:20 AM   Specimen: BLOOD  Result Value Ref Range Status   Specimen Description   Final    BLOOD BLOOD RIGHT ARM AEROBIC BOTTLE ONLY ANAEROBIC BOTTLE ONLY Performed at [facility] [address] [place], [place] [address]    Special Requests   Final    BOTTLES DRAWN AEROBIC AND ANAEROBIC Blood Culture results may not be optimal due to an inadequate volume of blood received in culture bottles Performed at [facility], [address], [place], [place] [address]    Culture   Final    NO GROWTH 5 DAYS Performed at [facility], [address] [place], [place] [address]    Report Status 04/18/2024 FINAL  Final  Urine Culture     Status: None   Collection Time: 04/13/24 10:18 AM   Specimen: Urine, Clean Catch  Result Value Ref Range Status   Specimen Description   Final    URINE, CLEAN CATCH Performed at [facility], [address] [place], [place] [address]    Special Requests   Final    NONE Performed at [facility], [address], [place], [place] [address]    Culture   Final    NO GROWTH Performed at [facility], [address], [place], [place] [address]    Report Status 04/14/2024 FINAL  Final  MRSA Next Gen by PCR, Nasal     Status: None   Collection Time: 04/13/24 11:35 AM   Specimen: Nasal Mucosa; Nasal Swab   Result Value Ref Range Status   MRSA by PCR Next Gen NOT DETECTED NOT DETECTED Final    Comment: (NOTE) The GeneXpert MRSA Assay (FDA approved for NASAL specimens only), is one component of a comprehensive MRSA colonization surveillance program. It is not intended to diagnose MRSA infection nor to guide or monitor treatment for MRSA infections. Test performance is not FDA approved in patients less than 54 years old. Performed at [facility], [address] [place], [place] [address]   Culture, blood (Routine X 2) w Reflex to ID Panel     Status: None (Preliminary result)   Collection Time: 04/20/24  8:46 AM   Specimen: BLOOD RIGHT ARM  Result Value Ref Range Status   Specimen Description   Final    BLOOD RIGHT ARM Performed at [facility], [address], [place], [place] [address]    Special Requests   Final    BOTTLES DRAWN AEROBIC AND ANAEROBIC Blood Culture results may not be optimal due to an inadequate volume of blood received in culture bottles Performed at [facility], [address] [place], [place] [address]    Culture   Final    NO GROWTH 3 DAYS Performed at [facility], [address], [place], [place] [address]    Report Status PENDING  Incomplete  Culture, blood (Routine X 2) w Reflex to ID Panel     Status: None (Preliminary result)   Collection Time: 04/20/24  8:49 AM   Specimen: BLOOD  Result Value Ref Range Status   Specimen Description   Final    BLOOD Performed at [facility], [address] [place], [place] [address]    Special Requests   Final    BOTTLES DRAWN AEROBIC AND ANAEROBIC Blood Culture results may not be optimal due to an inadequate volume of blood received in  culture bottles Performed at [facility], [address] [place], [place] [address]    Culture   Final    NO GROWTH 3 DAYS Performed at [facility], [address] [place], [place] [address]     Report Status PENDING  Incomplete  Culture, Respiratory w Gram Stain     Status: None (Preliminary result)   Collection Time: 04/20/24 12:03 PM   Specimen: Tracheal Aspirate; Respiratory  Result Value Ref Range Status   Specimen Description   Final    TRACHEAL ASPIRATE Performed at [facility], [address] [place], [place] [address]    Special Requests   Final    NONE Performed at [facility], [address] [place], [place] [address]    Gram Stain NO WBC SEEN RARE GRAM POSITIVE COCCI   Final   Culture   Final    CULTURE REINCUBATED FOR BETTER GROWTH Performed at [facility], [address], [place], [place] [address]    Report Status PENDING  Incomplete  Aerobic/Anaerobic Culture w Gram Stain (surgical/deep wound)     Status: None (Preliminary result)   Collection Time: 04/21/24  2:23 PM   Specimen: Soft Tissue, Other  Result Value Ref Range Status   Specimen Description   Final    TISSUE INTRA ABDOMINAL NECROTIC TISSUE Performed at [facility], [address] [place], [place] [address]    Special Requests   Final    NONE Performed at [facility], [address], [place], [place] [address]    Gram Stain   Final    FEW WBC PRESENT,BOTH PMN AND MONONUCLEAR RARE GRAM POSITIVE COCCI IN PAIRS AND CHAINS RARE YEAST    Culture   Final    RARE GRAM NEGATIVE RODS CULTURE REINCUBATED FOR BETTER GROWTH Performed at [facility], [address], [place], [place] [address]    Report Status PENDING  Incomplete    Labs: Results for orders placed or performed during the hospital encounter of 04/10/24 (from the past 48 hours)  Glucose, capillary     Status: Abnormal   Collection Time: 04/21/24  8:06 AM  Result Value Ref Range   Glucose-Capillary 285 (H) 70 - 99 mg/dL    Comment: Glucose reference range applies only to samples taken after fasting for at least 8 hours.   Comment 1  Notify RN    Comment 2 Document in Chart   Type and screen [facility]     Status: None   Collection Time: 04/21/24  8:35 AM  Result Value Ref Range   ABO/RH(D) AB POS    Antibody Screen NEG    Sample Expiration 04/24/2024,2359    Unit Number [id number]    Blood Component Type RBC LR PHER2    Unit division 00    Status of Unit ISSUED,FINAL    Transfusion Status OK TO TRANSFUSE    Crossmatch Result      Compatible Performed at [facility], [address] [place], [place] [address]   Blood gas, arterial     Status: Abnormal   Collection Time: 04/21/24  8:35 AM  Result Value Ref Range   FIO2 40 %   O2 Content 17.7 L/min   Mode PRESSURE REGULATED VOLUME CONTROL    MECHVT 600 mL   RATE 18 resp/min   PEEP 5 cm H20   pH, Arterial 7.25 (L) 7.35 - 7.45  pCO2 arterial 53 (H) 32 - 48 mmHg   pO2, Arterial 112 (H) 83 - 108 mmHg   Bicarbonate 23.3 20.0 - 28.0 mmol/L   Acid-base deficit 4.3 (H) 0.0 - 2.0 mmol/L   O2 Saturation 100 %   Patient temperature 37.4    Collection site A-LINE DRAW    Allens test (pass/fail) PASS PASS    Comment: Performed at [facility], [address], [place], [place] [address]  Prepare RBC (crossmatch)     Status: None   Collection Time: 04/21/24 11:32 AM  Result Value Ref Range   Order Confirmation      ORDER PROCESSED BY BLOOD BANK Performed at [facility], [address] [place], [place] [address]   Glucose, capillary     Status: Abnormal   Collection Time: 04/21/24 11:53 AM  Result Value Ref Range   Glucose-Capillary 281 (H) 70 - 99 mg/dL    Comment: Glucose reference range applies only to samples taken after fasting for at least 8 hours.   Comment 1 Notify RN    Comment 2 Document in Chart   Aerobic/Anaerobic Culture w Gram Stain (surgical/deep wound)     Status: None (Preliminary result)   Collection Time: 04/21/24  2:23 PM   Specimen: Soft Tissue, Other   Result Value Ref Range   Specimen Description      TISSUE INTRA ABDOMINAL NECROTIC TISSUE Performed at [facility], [address] [place], [place] [address]    Special Requests      NONE Performed at [facility], [address] [place] [address], [place], [place] [address]    Gram Stain      FEW WBC PRESENT,BOTH PMN AND MONONUCLEAR RARE GRAM POSITIVE COCCI IN PAIRS AND CHAINS RARE YEAST    Culture      RARE GRAM NEGATIVE RODS CULTURE REINCUBATED FOR BETTER GROWTH Performed at [facility], [address], [place], [place] [address]    Report Status PENDING   Glucose, capillary     Status: Abnormal   Collection Time: 04/21/24  5:09 PM  Result Value Ref Range   Glucose-Capillary 266 (H) 70 - 99 mg/dL    Comment: Glucose reference range applies only to samples taken after fasting for at least 8 hours.   Comment 1 Notify RN    Comment 2 Document in Chart   Comprehensive metabolic panel     Status: Abnormal   Collection Time: 04/21/24  5:15 PM  Result Value Ref Range   Sodium 136 135 - 145 mmol/L   Potassium 5.8 (H) 3.5 - 5.1 mmol/L   Chloride 105 98 - 111 mmol/L   CO2 21 (L) 22 - 32 mmol/L   Glucose, Bld 309 (H) 70 - 99 mg/dL    Comment: Glucose reference range applies only to samples taken after fasting for at least 8 hours.   BUN 73 (H) 8 - 23 mg/dL   Creatinine, Ser 1.30 (H) 0.61 - 1.24 mg/dL   Calcium  6.1 (LL) 8.9 - 10.3 mg/dL    Comment: CRITICAL RESULT CALLED TO, READ BACK BY AND VERIFIED WITH A.FRY, RN AT 2020 ON 04/21/24 BY N.THOMPSON    Total Protein 4.7 (L) 6.5 - 8.1 g/dL   Albumin  2.1 (L) 3.5 - 5.0 g/dL   AST 19 15 - 41 U/L   ALT 12 0 - 44 U/L   Alkaline Phosphatase 48 38 - 126 U/L   Total Bilirubin 0.7 0.0 - 1.2 mg/dL  GFR, Estimated 20 (L) >60 mL/min    Comment: (NOTE) Calculated using the CKD-EPI Creatinine Equation (2021)    Anion gap 10 5 - 15    Comment: Performed at [facility], [address] [place], [place] [address]  Lactic acid, plasma     Status: None   Collection Time: 04/21/24  5:15 PM  Result Value Ref Range   Lactic Acid, Venous 1.4 0.5 - 1.9 mmol/L    Comment: Performed at [facility], [address], [place], [place] [address]  CBC with Differential/Platelet     Status: Abnormal   Collection Time: 04/21/24  5:15 PM  Result Value Ref Range   WBC 26.4 (H) 4.0 - 10.5 K/uL   RBC 2.76 (L) 4.22 - 5.81 MIL/uL   Hemoglobin 7.8 (L) 13.0 - 17.0 g/dL   HCT 09.8 (L) 11.9 - 14.7 %   MCV 96.0 80.0 - 100.0 fL   MCH 28.3 26.0 - 34.0 pg   MCHC 29.4 (L) 30.0 - 36.0 g/dL   RDW 82.9 (H) 56.2 - 13.0 %   Platelets 281 150 - 400 K/uL   nRBC 0.0 0.0 - 0.2 %   Neutrophils Relative % 90 %   Neutro Abs 23.6 (H) 1.7 - 7.7 K/uL   Lymphocytes Relative 3 %   Lymphs Abs 0.8 0.7 - 4.0 K/uL   Monocytes Relative 3 %   Monocytes Absolute 0.9 0.1 - 1.0 K/uL   Eosinophils Relative 0 %   Eosinophils Absolute 0.0 0.0 - 0.5 K/uL   Basophils Relative 0 %   Basophils Absolute 0.1 0.0 - 0.1 K/uL   Immature Granulocytes 4 %   Abs Immature Granulocytes 1.08 (H) 0.00 - 0.07 K/uL    Comment: Performed at [facility], [address], [place], [place] [address]  Blood gas, arterial     Status: Abnormal   Collection Time: 04/21/24  5:15 PM  Result Value Ref Range   FIO2 40 %   Delivery systems VENTILATOR    Mode PRESSURE REGULATED VOLUME CONTROL    MECHVT 600 mL   RATE 22 resp/min   PEEP 5 cm H20   pH, Arterial 7.28 (L) 7.35 - 7.45   pCO2 arterial 47 32 - 48 mmHg   pO2, Arterial 75 (L) 83 - 108 mmHg   Bicarbonate 22.1 20.0 - 28.0 mmol/L   Acid-base deficit 4.8 (H) 0.0 - 2.0 mmol/L   O2 Saturation 98.8 %   Patient temperature 37.0    Collection site A-LINE DRAW     Comment: Performed at [facility], [address], [place], [place] [address]  Cooxemetry Panel (carboxy, met, total hgb, O2 sat)     Status: Abnormal   Collection Time:  04/21/24  6:15 PM  Result Value Ref Range   Total hemoglobin 7.2 (L) 12.0 - 16.0 g/dL   O2 Saturation 95.2 %   Carboxyhemoglobin 2.4 (H) 0.5 - 1.5 %   Methemoglobin <0.7 0.0 - 1.5 %    Comment: Performed at [name], [address], [place], [place] [address]  Glucose, capillary     Status: Abnormal   Collection Time: 04/21/24  8:04 PM  Result Value Ref Range   Glucose-Capillary 278 (H) 70 - 99 mg/dL    Comment: Glucose reference range applies only to samples taken after fasting for at least 8 hours.   Comment 1 Notify RN    Comment 2 Document in Chart  Glucose, capillary     Status: Abnormal   Collection Time: 04/21/24 11:31 PM  Result Value Ref Range   Glucose-Capillary 255 (H) 70 - 99 mg/dL    Comment: Glucose reference range applies only to samples taken after fasting for at least 8 hours.   Comment 1 Notify RN    Comment 2 Document in Chart   Hemoglobin and hematocrit, blood     Status: Abnormal   Collection Time: 04/21/24 11:47 PM  Result Value Ref Range   Hemoglobin 8.8 (L) 13.0 - 17.0 g/dL   HCT 40.9 (L) 81.1 - 91.4 %    Comment: Performed at [facility], [address], [place], [place] [address]  CBC     Status: Abnormal   Collection Time: 04/22/24  3:41 AM  Result Value Ref Range   WBC 27.6 (H) 4.0 - 10.5 K/uL   RBC 3.03 (L) 4.22 - 5.81 MIL/uL   Hemoglobin 8.8 (L) 13.0 - 17.0 g/dL   HCT 62.1 (L) 30.8 - 65.7 %   MCV 89.8 80.0 - 100.0 fL   MCH 29.0 26.0 - 34.0 pg   MCHC 32.4 30.0 - 36.0 g/dL   RDW 84.6 (H) 96.2 - 95.2 %   Platelets 283 150 - 400 K/uL   nRBC 0.3 (H) 0.0 - 0.2 %    Comment: Performed at [facility], [address], [place], [place] [address]  Basic metabolic panel     Status: Abnormal   Collection Time: 04/22/24  3:41 AM  Result Value Ref Range   Sodium 136 135 - 145 mmol/L   Potassium 5.1 3.5 - 5.1 mmol/L   Chloride 103 98 - 111 mmol/L   CO2 21 (L) 22 - 32 mmol/L   Glucose, Bld 287  (H) 70 - 99 mg/dL    Comment: Glucose reference range applies only to samples taken after fasting for at least 8 hours.   BUN 80 (H) 8 - 23 mg/dL   Creatinine, Ser 4.40 (H) 0.61 - 1.24 mg/dL   Calcium  6.2 (LL) 8.9 - 10.3 mg/dL    Comment: CRITICAL RESULT CALLED TO, READ BACK BY AND VERIFIED WITH A. FRY, RN 04/22/24 0441 BY K. DAVIS   GFR, Estimated 17 (L) >60 mL/min    Comment: (NOTE) Calculated using the CKD-EPI Creatinine Equation (2021)    Anion gap 12 5 - 15    Comment: Performed at [facility], [address] [place], [place] [address]  Magnesium      Status: None   Collection Time: 04/22/24  3:41 AM  Result Value Ref Range   Magnesium  2.3 1.7 - 2.4 mg/dL    Comment: Performed at [facility], [address] [place], [place] [address]  Phosphorus     Status: Abnormal   Collection Time: 04/22/24  3:41 AM  Result Value Ref Range   Phosphorus 8.5 (H) 2.5 - 4.6 mg/dL    Comment: Performed at [facility], [address] [place], [place] [address]  Glucose, capillary     Status: Abnormal   Collection Time: 04/22/24  3:58 AM  Result Value Ref Range   Glucose-Capillary 248 (H) 70 - 99 mg/dL    Comment: Glucose reference range applies only to samples taken after fasting for at least 8 hours.  Glucose, capillary     Status: Abnormal   Collection Time: 04/22/24  7:55 AM  Result Value Ref Range   Glucose-Capillary 245 (H) 70 - 99  mg/dL    Comment: Glucose reference range applies only to samples taken after fasting for at least 8 hours.  Glucose, capillary     Status: Abnormal   Collection Time: 04/22/24 11:27 AM  Result Value Ref Range   Glucose-Capillary 219 (H) 70 - 99 mg/dL    Comment: Glucose reference range applies only to samples taken after fasting for at least 8 hours.   Comment 1 Notify RN   Renal function panel (daily at 1600)     Status: Abnormal   Collection Time: 04/22/24  4:04 PM  Result Value Ref Range    Sodium 134 (L) 135 - 145 mmol/L   Potassium 4.2 3.5 - 5.1 mmol/L   Chloride 100 98 - 111 mmol/L   CO2 20 (L) 22 - 32 mmol/L   Glucose, Bld 279 (H) 70 - 99 mg/dL    Comment: Glucose reference range applies only to samples taken after fasting for at least 8 hours.   BUN 79 (H) 8 - 23 mg/dL   Creatinine, Ser 9.51 (H) 0.61 - 1.24 mg/dL   Calcium  6.5 (L) 8.9 - 10.3 mg/dL   Phosphorus 7.9 (H) 2.5 - 4.6 mg/dL   Albumin  1.8 (L) 3.5 - 5.0 g/dL   GFR, Estimated 17 (L) >60 mL/min    Comment: (NOTE) Calculated using the CKD-EPI Creatinine Equation (2021)    Anion gap 14 5 - 15    Comment: Performed at [facility], [address], [place], [place] [address]  Glucose, capillary     Status: Abnormal   Collection Time: 04/22/24  4:12 PM  Result Value Ref Range   Glucose-Capillary 242 (H) 70 - 99 mg/dL    Comment: Glucose reference range applies only to samples taken after fasting for at least 8 hours.  Glucose, capillary     Status: Abnormal   Collection Time: 04/22/24  8:03 PM  Result Value Ref Range   Glucose-Capillary 230 (H) 70 - 99 mg/dL    Comment: Glucose reference range applies only to samples taken after fasting for at least 8 hours.  Glucose, capillary     Status: Abnormal   Collection Time: 04/22/24 11:49 PM  Result Value Ref Range   Glucose-Capillary 231 (H) 70 - 99 mg/dL    Comment: Glucose reference range applies only to samples taken after fasting for at least 8 hours.  Comprehensive metabolic panel     Status: Abnormal   Collection Time: 04/23/24  4:16 AM  Result Value Ref Range   Sodium 135 135 - 145 mmol/L   Potassium 4.4 3.5 - 5.1 mmol/L   Chloride 101 98 - 111 mmol/L   CO2 24 22 - 32 mmol/L   Glucose, Bld 231 (H) 70 - 99 mg/dL    Comment: Glucose reference range applies only to samples taken after fasting for at least 8 hours.   BUN 68 (H) 8 - 23 mg/dL   Creatinine, Ser 6.06 (H) 0.61 - 1.24 mg/dL   Calcium  6.8 (L) 8.9 - 10.3 mg/dL   Total Protein  4.9 (L) 6.5 - 8.1 g/dL   Albumin  1.7 (L) 3.5 - 5.0 g/dL   AST 21 15 - 41 U/L   ALT 13 0 - 44 U/L   Alkaline Phosphatase 46 38 - 126 U/L   Total Bilirubin 0.7 0.0 - 1.2 mg/dL   GFR, Estimated 22 (L) >60 mL/min    Comment: (NOTE) Calculated using the CKD-EPI Creatinine Equation (2021)    Anion gap 10 5 -  15    Comment: Performed at [facility], [address], [place], [place] [address]  Magnesium      Status: None   Collection Time: 04/23/24  4:16 AM  Result Value Ref Range   Magnesium  2.4 1.7 - 2.4 mg/dL    Comment: Performed at [facility], [address], [place], [place] [address]  Phosphorus     Status: Abnormal   Collection Time: 04/23/24  4:16 AM  Result Value Ref Range   Phosphorus 5.2 (H) 2.5 - 4.6 mg/dL    Comment: Performed at [facility], [address], [place], [place] [address]  CBC     Status: Abnormal   Collection Time: 04/23/24  4:16 AM  Result Value Ref Range   WBC 17.0 (H) 4.0 - 10.5 K/uL   RBC 2.76 (L) 4.22 - 5.81 MIL/uL   Hemoglobin 8.0 (L) 13.0 - 17.0 g/dL   HCT 64.3 (L) 32.9 - 51.8 %   MCV 89.5 80.0 - 100.0 fL   MCH 29.0 26.0 - 34.0 pg   MCHC 32.4 30.0 - 36.0 g/dL   RDW 84.1 66.0 - 63.0 %   Platelets 237 150 - 400 K/uL   nRBC 0.1 0.0 - 0.2 %    Comment: Performed at [facility], [address], [place], [place] [address]  Renal function panel (daily at 0500)     Status: Abnormal   Collection Time: 04/23/24  4:16 AM  Result Value Ref Range   Sodium 134 (L) 135 - 145 mmol/L   Potassium 4.3 3.5 - 5.1 mmol/L   Chloride 99 98 - 111 mmol/L   CO2 23 22 - 32 mmol/L   Glucose, Bld 235 (H) 70 - 99 mg/dL    Comment: Glucose reference range applies only to samples taken after fasting for at least 8 hours.   BUN 68 (H) 8 - 23 mg/dL   Creatinine, Ser 9.32 (H) 0.61 - 1.24 mg/dL   Calcium  6.7 (L) 8.9 - 10.3 mg/dL   Phosphorus 5.4 (H) 2.5 - 4.6 mg/dL   Albumin  1.8 (L) 3.5 - 5.0 g/dL    GFR, Estimated 23 (L) >60 mL/min    Comment: (NOTE) Calculated using the CKD-EPI Creatinine Equation (2021)    Anion gap 12 5 - 15    Comment: Performed at [facility], [address], [place], [place] [address]  APTT     Status: Abnormal   Collection Time: 04/23/24  4:16 AM  Result Value Ref Range   aPTT 37 (H) 24 - 36 seconds    Comment:        IF BASELINE aPTT IS ELEVATED, SUGGEST PATIENT RISK ASSESSMENT BE USED TO DETERMINE APPROPRIATE ANTICOAGULANT THERAPY. Performed at [facility], [address], [place], [place] [address]   Glucose, capillary     Status: Abnormal   Collection Time: 04/23/24  4:42 AM  Result Value Ref Range   Glucose-Capillary 223 (H) 70 - 99 mg/dL    Comment: Glucose reference range applies only to samples taken after fasting for at least 8 hours.    Imaging / Studies: DG Chest Port 1 View Result Date: 04/22/2024 CLINICAL DATA:  Central line placement. EXAM: PORTABLE CHEST 1 VIEW COMPARISON:  04/20/2024. FINDINGS: Left IJ hemodialysis CVC catheter tip overlies the lower SVC. Right PICC tip overlies the superior cavoatrial junction. Endotracheal tube tip is located proximally 4 cm above the carina. Enteric tube tip and side port within  the stomach. Low lung volumes with unchanged cardiomediastinal silhouette. No pneumothorax. Left basilar opacity may reflect atelectasis or infiltrate. Mild right basilar atelectasis. IMPRESSION: 1. Left IJ hemodialysis CVC catheter tip overlies the lower SVC. 2. Additional life-support apparatus, as above. 3. Low lung volumes with left basilar opacity, which may reflect atelectasis or infiltrate. Mild right basilar atelectasis. Electronically Signed   By: Mannie Seek M.D.   On: 04/22/2024 12:25     Medications / Allergies: per chart  Antibiotics: Anti-infectives (From admission, onward)    Start     Dose/Rate Route Frequency Ordered Stop   04/21/24 1400  clindamycin (CLEOCIN)  900 mg, gentamicin  (GARAMYCIN ) 240 mg in sodium chloride  0.9 % 1,000 mL for intraperitoneal lavage  Status:  Discontinued         Irrigation To Surgery 04/21/24 1346 04/21/24 1618   04/20/24 1400  piperacillin -tazobactam (ZOSYN ) IVPB 3.375 g        3.375 g 12.5 mL/hr over 240 Minutes Intravenous Every 8 hours 04/20/24 0755     04/20/24 1000  metroNIDAZOLE  (FLAGYL ) IVPB 500 mg  Status:  Discontinued        500 mg 100 mL/hr over 60 Minutes Intravenous Every 12 hours 04/20/24 0320 04/20/24 0752   04/20/24 1000  micafungin (MYCAMINE) 150 mg in sodium chloride  0.9 % 100 mL IVPB        150 mg 107.5 mL/hr over 1 Hours Intravenous Every 24 hours 04/20/24 0752     04/20/24 0245  ceFEPIme  (MAXIPIME ) 2 g in sodium chloride  0.9 % 100 mL IVPB  Status:  Discontinued        2 g 200 mL/hr over 30 Minutes Intravenous Every 8 hours 04/20/24 0232 04/20/24 0752   04/20/24 0100  metroNIDAZOLE  (FLAGYL ) IVPB 500 mg        500 mg 100 mL/hr over 60 Minutes Intravenous On call to O.R. 04/20/24 0004 04/20/24 0100   04/14/24 1800  ceFEPIme  (MAXIPIME ) 2 g in sodium chloride  0.9 % 100 mL IVPB        2 g 200 mL/hr over 30 Minutes Intravenous Every 8 hours 04/14/24 1003 04/19/24 0957   04/14/24 1600  vancomycin  (VANCOCIN ) IVPB 1000 mg/200 mL premix  Status:  Discontinued        1,000 mg 200 mL/hr over 60 Minutes Intravenous Every 24 hours 04/13/24 1420 04/14/24 1000   04/14/24 1600  vancomycin  (VANCOREADY) IVPB 1500 mg/300 mL  Status:  Discontinued        1,500 mg 150 mL/hr over 120 Minutes Intravenous Every 24 hours 04/14/24 1002 04/16/24 0744   04/14/24 1200  vancomycin  (VANCOCIN ) IVPB 1000 mg/200 mL premix  Status:  Discontinued        1,000 mg 200 mL/hr over 60 Minutes Intravenous Every 24 hours 04/13/24 1053 04/13/24 1109   04/13/24 2200  ceFEPIme  (MAXIPIME ) 2 g in sodium chloride  0.9 % 100 mL IVPB  Status:  Discontinued        2 g 200 mL/hr over 30 Minutes Intravenous Every 12 hours 04/13/24 1036 04/13/24 1109    04/13/24 2200  ceFEPIme  (MAXIPIME ) 2 g in sodium chloride  0.9 % 100 mL IVPB  Status:  Discontinued        2 g 200 mL/hr over 30 Minutes Intravenous Every 12 hours 04/13/24 1425 04/14/24 1003   04/13/24 1500  vancomycin  (VANCOCIN ) 2,000 mg in sodium chloride  0.9 % 500 mL IVPB        2,000 mg 260 mL/hr over 120 Minutes Intravenous  Once  04/13/24 1409 04/13/24 1850   04/13/24 1500  ceFEPIme  (MAXIPIME ) 2 g in sodium chloride  0.9 % 100 mL IVPB  Status:  Discontinued        2 g 200 mL/hr over 30 Minutes Intravenous Every 12 hours 04/13/24 1420 04/13/24 1425   04/13/24 1500  metroNIDAZOLE  (FLAGYL ) IVPB 500 mg  Status:  Discontinued        500 mg 100 mL/hr over 60 Minutes Intravenous Every 12 hours 04/13/24 1420 04/17/24 0725   04/13/24 1200  vancomycin  (VANCOCIN ) 2,000 mg in sodium chloride  0.9 % 500 mL IVPB  Status:  Discontinued        2,000 mg 260 mL/hr over 120 Minutes Intravenous  Once 04/13/24 1052 04/13/24 1121   04/13/24 1100  metroNIDAZOLE  (FLAGYL ) IVPB 500 mg  Status:  Discontinued        500 mg 100 mL/hr over 60 Minutes Intravenous 2 times daily 04/13/24 1007 04/13/24 1109   04/13/24 1100  vancomycin  (VANCOCIN ) IVPB 1000 mg/200 mL premix  Status:  Discontinued        1,000 mg 200 mL/hr over 60 Minutes Intravenous  Once 04/13/24 1007 04/13/24 1020   04/13/24 1015  ceFEPIme  (MAXIPIME ) 2 g in sodium chloride  0.9 % 100 mL IVPB        2 g 200 mL/hr over 30 Minutes Intravenous STAT 04/13/24 1007 04/14/24 1721   04/10/24 2200  ceFAZolin  (ANCEF ) IVPB 2g/100 mL premix        2 g 200 mL/hr over 30 Minutes Intravenous Every 8 hours 04/10/24 1833 04/11/24 0531   04/10/24 0600  ceFAZolin  (ANCEF ) IVPB 2g/100 mL premix        2 g 200 mL/hr over 30 Minutes Intravenous On call to O.R. 04/10/24 0533 04/10/24 1526         Note: Portions of this report may have been transcribed using voice recognition software. Every effort was made to ensure accuracy; however, inadvertent computerized  transcription errors may be present.   Any transcriptional errors that result from this process are unintentional.    Eddye Goodie, MD, FACS, MASCRS Esophageal, Gastrointestinal & Colorectal Surgery Robotic and Minimally Invasive Surgery  [facility] [address], [address] [place], [place] [address] [phone] Fax [phone] Main  CONTACT INFORMATION: Weekday (9AM-5PM): Call [facility] main office at [phone] Weeknight (5PM-9AM) or Weekend/Holiday: Check EPIC Web Links tab & use AMION (password  [email or web address]) for General Surgery [facility] coverage  Please, DO NOT use SecureChat  (it is not reliable communication to reach operating surgeons & will lead to a delay in care).   Epic staff messaging available for outptient concerns needing 1-2 business day response.      04/23/2024  7:42 AM

## 2024-04-23 NOTE — Progress Notes (Signed)
 eLink Physician-Brief Progress Note Patient Name: Kristopher Thompson DOB: 1950/12/07 MRN: [id number]   Date of Service  04/23/2024  HPI/Events of Note  Intermittent SBP drops into the 70's, possibly reflecting volume shifts, serum albumin  is 1.9.  eICU Interventions  Will give a 500 ml 5% albumin  bolus.        Alfreddie Consalvo U Tillie Viverette 04/23/2024, 10:08 PM

## 2024-04-23 NOTE — Progress Notes (Signed)
 PHARMACY - TOTAL PARENTERAL NUTRITION CONSULT NOTE   Indication: Prolonged ileus  Patient Measurements: Height: 5' 11 (180.3 cm) Weight: 129 kg (284 lb 6.3 oz) IBW/kg (Calculated) : 75.3 TPN AdjBW (KG): 83.7 Body mass index is 39.66 kg/m.  Assessment: Pt is a 47 yoM who underwent hernia repair and urostomy revision on 04/10/24. Pt developed post-op ileus. Pharmacy consulted to manage TPN, which was started on 6/2. Patient required return to OR early 6/9 am for perforated bowel s/p drainage of abscesses, small bowel resection (in discontinuity) with placement of wound VAC. He returned to the ICU intubated and requiring vasopressor support. He remained on 3 vasopressors overnight, stress dose steroids added early 6/10. On 6/10, able to stop phenylephrine . Patient returned to OR and is now s/p abdominal wall debridement, ileal resection, end ileostomy, hernia repairs and fascia closure w/ placement of wound vac. He was started on trickle feeds with plan to hopefully advance with improvement in vasopressor needs. He remains on TPN pending ability to advance tube feeds.  Glucose / Insulin : Hx DM on metformin PTA. A1c 5.3% -CBGs previously reasonably controlled; now consistently elevated > 200 -Semglee increased to 40 units -49 units of SSI used in last 24hrs (consistently receiving 7u q4hr) -Stress dose steroids started 6/10 am, now tapering Electrolytes:  -Na WNL -Phos 5.2 - elevated, but decreasing now that on CRRT -K 4.4 - no K in TPN since 6/9, on 4K bath for CRRT -CorrCa 8.6 - just below normal, has received 6 g Ca gluconate over the last few days Renal: CRRT; only 60 ml UOP yesterday, 100 ml documented this am Hepatic: albumin  1.7; LFTs WNL; TG 64 on 6/10 Intake / Output; MIVF:  -Minimal UOP, on CRRT -NG/emesis 190 ml yesterday -Drain > 1 L yesterday -Stool last charted on 6/6, having some bowel function per notes GI Imaging: -6/9: Air-fluid collection in the subcutaneous soft  tissues of the right anterior pelvic wall extending from the midline to the ileostomy -6/3: Right lower quadrant small bowel loop with mural defect and adjacent extraluminal gas identified, which is concerning for underlying small bowel perforation. GI Surgeries / Procedures:  -6/10: Abdominal wall debridement, ileal resection, end ileostomy, hernia repairs, fascia closure w/ placement of wound vac -6/9: Perforated bowel - drainage of right abdominal wall abscess as well as intra-abdominal abscess, explantation of abdominal mesh, small bowel resection (in discontinuity), placement of wound VAC -5/30: Hernia repair, lysis of adhesions, urostomy revision  Central access: PICC TPN start date: 6/2   Nutritional Goals:  Decrease goal TPN rate to 75 mL/hr (provides 100 g of protein and 1465 kcals per day) Tube feeds increased to 30 ml/hr - provides an additional 63 g protein and 720 kcal This will give a total of 163 g protein and 2185 kcals per day meeting 100% of patient's increased needs to account for CRRT and multiple abdominal surgeries   RD Assessment: Estimated Needs Total Energy Estimated Needs: 2100-2300 kcals Total Protein Estimated Needs: 155-180 grams Total Fluid Estimated Needs: >/= 2.1L  Current Nutrition:  TPN  Tube feeds advanced to 30 ml/hr  Plan: Continue TPN at decreased rate 75 mL/hr - still meeting nutritional needs but attempting to decrease volume with AKI/CRRT and increasing tube feeds Add back lipids given patient is no longer on propofol  Electrolytes in TPN:  Na 50 mEq/L K 0 mEq/L - will leave K out of TPN while on CRRT, currently on 4 K bath Ca 5 mEq/L Mg 4 mEq/L   Phos 5  mmol/L - add back minimal phos in anticipation of CRRT continuing to decrease phos Cl:Ac 1:1 Add standard MVI and trace elements to TPN Semglee increased to 40 units daily and continues on resistant SSI q4hr (consistently receiving 7 units). Given carb content to remain approximately the  same from yesterday (270 g vs 261 g), will add standing Novolog  3 units q4hr to be given in addition to the SSI (hold if tube feeds are held/stopped) Monitor TPN labs on Mon/Thurs at minimum for TPN, currently checking labs BID for CRRT   Lolita Rise, PharmD, BCPS Clinical Pharmacist 04/23/2024 7:14 AM

## 2024-04-23 NOTE — Progress Notes (Signed)
 Patient ID: Kristopher Thompson, male   DOB: [birth year], 73 y.o.   MRN: [id number]  2 Days Post-Op Subjective: Pt intubated on CVVHD.  Objective: Vital signs in last 24 hours: Temp:  [95.5 F (35.3 C)-98.1 F (36.7 C)] 97.5 F (36.4 C) (06/12 0315) Pulse Rate:  [54-80] 55 (06/12 0315) Resp:  [22-30] 25 (06/12 0315) BP: (95-137)/(49-86) 119/60 (06/12 0432) SpO2:  [93 %-100 %] 100 % (06/12 0315) Arterial Line BP: (78-164)/(40-70) 136/60 (06/12 0315) FiO2 (%):  [40 %] 40 % (06/12 0432) Weight:  [010 kg] 129 kg (06/11 1309)  Intake/Output from previous day: 06/11 0701 - 06/12 0700 In: 8680.1 [I.V.:6483.6; NG/GT:629; IV Piggyback:1567.5] Out: 8608 [Urine:160; Emesis/NG output:130; Drains:840; Stool:125] Intake/Output this shift: No intake/output data recorded.  Physical Exam:  Abd: Soft, urostomy pink  Lab Results: Recent Labs    04/21/24 2347 04/22/24 0341 04/23/24 0416  HGB 8.8* 8.8* 8.0*  HCT 27.6* 27.2* 24.7*   BMET Recent Labs    04/22/24 1604 04/23/24 0416  NA 134* 134*  135  K 4.2 4.3  4.4  CL 100 99  101  CO2 20* 23  24  GLUCOSE 279* 235*  231*  BUN 79* 68*  68*  CREATININE 3.64* 2.85*  2.99*  CALCIUM  6.5* 6.7*  6.8*     Studies/Results: DG Chest Port 1 View Result Date: 04/22/2024 CLINICAL DATA:  Central line placement. EXAM: PORTABLE CHEST 1 VIEW COMPARISON:  04/20/2024. FINDINGS: Left IJ hemodialysis CVC catheter tip overlies the lower SVC. Right PICC tip overlies the superior cavoatrial junction. Endotracheal tube tip is located proximally 4 cm above the carina. Enteric tube tip and side port within the stomach. Low lung volumes with unchanged cardiomediastinal silhouette. No pneumothorax. Left basilar opacity may reflect atelectasis or infiltrate. Mild right basilar atelectasis. IMPRESSION: 1. Left IJ hemodialysis CVC catheter tip overlies the lower SVC. 2. Additional life-support apparatus, as above. 3. Low lung volumes with left basilar opacity,  which may reflect atelectasis or infiltrate. Mild right basilar atelectasis. Electronically Signed   By: Mannie Seek M.D.   On: 04/22/2024 12:25    Assessment/Plan: 1) AKI/bilateral hydro:  On CVVHD.  Still do not suspect obstructive etiology but reasonable to consider bilateral nephrostomy tubes if not improving.  Can consider checking drain Cr to ensure no urine leak as well.    LOS: 13 days   Kristopher Thompson 04/23/2024, 7:29 AM

## 2024-04-23 NOTE — Progress Notes (Signed)
 Arterial line at this time has good waveform, draws and flushes blood, insertion site WNL.

## 2024-04-23 NOTE — Progress Notes (Signed)
 NAME:  Kristopher Thompson, MRN:  [id number], DOB:  1951-10-23, LOS: 13 ADMISSION DATE:  04/10/2024, CONSULTATION DATE:  04/13/2024 REFERRING MD:  Dr Bonita Bussing, CHIEF COMPLAINT: Sepsis  History of Present Illness:  S/p repair of incisional/parastomal and ventral hernias, left inguinal incarcerated , abdominal hernias, lysis of adhesions, urostomy revision Day 1 postoperatively  Transferred to the ICU secondary to tachycardia, tachypnea  History of bladder cancer, prostate cancer, diastolic heart failure, diabetes, right bundle branch block, multiple abdominal surgeries  Update 04/20/24: ccm reconsulted post operatively after pt had perforated bowel and returned to OR. Pt is requiring neo and norepi at this time. He remains intubated at this time. Profoundly acidotic with bicarb of 12. Will start sodium bicarb infusion and bolus prior to initiation. Follow labs in am. Updated wife and son at bedside.   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   moderate atypia - right low back   Atypical nevus 04/04/2007   moderate to marked - right upper back (wider shave)   Atypical nevus 04/04/2007   moderate atypia - center chest (wider shave)   Atypical nevus 04/04/2007   slight atypia - right thigh   Atypical nevus 11/29/2011   mild atypia - center upper back   Atypical nevus 11/29/2011   mild atypia - center chest   Bacteremia due to Klebsiella pneumoniae 10/09/2017   Bladder cancer (HCC) dx 07/2017   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 denies   Sleep apnea    cpap   Squamous cell carcinoma of skin 05/22/2013   left cheek - CX3 + 5FU   Wears glasses    Significant Hospital Events: Including procedures, antibiotic start and stop dates in addition to other pertinent events   04/10/2024-laparoscopic surgery 04/13/24 CCM consult. Incr NE. Art line  6/3 weaning NE. Gently diuresed w 3L off  6/4 off NE. Pulled out his NGT refused replacement. After multiple emesis  6/5 cont NGT 6/6 tx to med surg 6/9: taken back to OR for perforated bowel, post-op shock 6/10: Norepinephrine , Phenylephrine , and vasopressin, added steroids, 1u PRBCs, back to OR for washout and closure 6/11: Worsening AKI with fluid overload, start CRRT, decreasing pressor requirements  Interim History / Subjective:   No acute events overnight Levo 1, Vaso 0.03 On CRRT Tolerating SBT   Objective    Blood pressure 119/60, pulse 60, temperature (!) 97 F (36.1 C), resp. rate (!) 25, height 5' 11 (1.803 m), weight 129 kg, SpO2 97%. CVP:  [12 mmHg-27 mmHg] 16 mmHg  Vent Mode: PRVC FiO2 (%):  [40 %] 40 % Set Rate:  [25 bmp] 25 bmp Vt Set:  [600 mL] 600 mL PEEP:  [8 cmH20]  8 cmH20 Plateau Pressure:  [20 cmH20-22 cmH20] 20 cmH20   Intake/Output Summary (Last 24 hours) at 04/23/2024 0756 Last data filed at 04/23/2024 0700 Gross per 24 hour  Intake 8680.12 ml  Output 8528 ml  Net 152.12 ml   Filed Weights   04/19/24 0500 04/20/24 0500 04/22/24 1309  Weight: 99.5 kg 112.1 kg 129 kg    Examination: General: acutely ill appearing, orally intubated, sedated Neuro: PERL, spontaneously moving extremities, not following commands HEENT: normocephalic, orally intubated, NGT in place, eyes are non-icteric RESP: regular rate and rhythm, clear  to auscultation CV: rrr, +2 pitting edema in all extremities, palpable pulses in all GI: distant but present bowel sounds, abdomen distended but soft, colostomy present in RUQ with dark brown output and wound vac covering abdominal incision site, x2 JP drains in place with serosanguinous output  GU: urostomy with no urine present Skin: warm and dry, abdominal wound covered with wound vac   Resolved problem list    Assessment and Plan   Septic shock due to acute bowel perforation S/P OR for bowel repair left in discontinuity, return to OR 6/10 for closure and ileal resection and end ileostomy creation Likely caused by delayed ileal leak  Incarcerated abd wall hernia a/p LOA hernia repairs urostomy ileal conduit revision  Concern for possible ileal conduit leak Plan Zosyn , Micafungin day 2 of x (LOT TBD) Abdominal drains per surgery: wound vac and 2x JP drains Mag goal >65 Levophed , vasopressin Continue stress dose steroids day 3, started taper Blood cultures, no growth thus far Abdominal Cultures showing gram positive cocci in pairs/chains, rare yeast, gram negative rods On trickle feeds per surgery Pending creatinine level from abdominal drain fluid to check for urostomy ileal conduit leak  AKI w/ Severe Non-anion gap Metabolic Acidosis; and hyperkalemia and hypocalcemia  -? GI loss. Placed on bicarb. Hypocalcemia (corrects to 7.7 w/albumin ) CVP 16 Plan Cont bicarb drip, may DC later today Trend CMP and serial chems Nephrology following, continue CRRT  Acute Hypoxemic and hypercarbic Respiratory Failure; intubated Hx OSA on CPAP, c/b dec'd abd compliance w/ post op atx and volume overload plan Continue mechanical ventilatory support VAP prevention Sedation and pain control; prop and fentanyl  Use propofol  for RASS goal -2  Hyperglycemia Hx of diabetes Plan Resistant dosing for SSI Long acting dose increased to 40u  Anemia: acute blood loss and chronic anemia R flank  hematoma  Hx PE on eliquis  plan Heparin  drip for hx of PE monitor for bleeding Trend CBC  H/o CHF  ECHO with EF 65-70% CVP 15-20 plan Trend CVP Monitor I/O balance   Moderate Protein Calorie Malnutrition Hypoalbuminemia plan continue TPN On trickle feeds, monitor for high residuals   Best Practice (right click and Reselect all SmartList Selections daily)   Diet/type: tubefeeds on tpn DVT prophylaxis systemic heparin   Pressure ulcer(s): N/A GI prophylaxis: H2B Lines: Central line, PICC art line  Foley:  N/A urostomy  Code Status:  full code Last date of multidisciplinary goals of care discussion [pending per primary team]     Critical care time: 45 minutes   [name], MD [facility] Office: [phone]   See Amion for personal pager PCCM on call pager [phone] until 7pm. Please call Elink 7p-7a. [phone]

## 2024-04-23 NOTE — Progress Notes (Signed)
 [name] NEPHROLOGY PROGRESS NOTE  Assessment/ Plan: Pt is a 73 y.o. yo male  with past medical history significant for hypertension, DM, CAD, bladder cancer, CHF, right bundle branch block who had acute bowel perforation and status post OR for bowel repair on 6/8, lysis of adhesion, ileal resection end ileostomy hernia repairs, septic shock seen as a consultation for the evaluation and management of AKI and oliguria.   # Acute kidney injury, oliguric with anasarca likely ischemic ATN due to septic shock and obstructive uropathy given bilateral hydronephrosis.  Noted CT scan of abdomen pelvis.   Started CRRT on 6/11 and tolerating well.   All 4K bath, fixed dose of heparin  and a UF around 100-200 cc net negative.  Strict ins and out and monitor lab.  # Bilateral hydronephrosis severe on the left side: Seen by urologist, may consider nephrostomy tube.   # Acute bowel perforation is status post bowel repair ileal conduit and hernia repairs urostomy ileal conduit revision.  As per surgical team.  Currently on TPN.   # Septic shock: Avoid further IV fluid, continue pressors and antibiotics per ICU team.   # Acute hypoxic respiratory failure/VDRF per pulmonary team.   # Metabolic acidosis managing with sodium bicarbonate .  CRRT as above.  Subjective: Seen and examined in ICU.  Tolerating CRRT well.  Weaning Levophed .  More alert than yesterday.  No significant urine output.  His wife is present at the bedside.  Discussed with ICU team. Objective Vital signs in last 24 hours: Vitals:   04/23/24 0700 04/23/24 0715 04/23/24 0730 04/23/24 0745  BP:      Pulse: (!) 59 (!) 55 (!) 57 60  Resp: (!) 25 (!) 25 (!) 25 (!) 25  Temp:    (!) 97 F (36.1 C)  TempSrc:      SpO2: 97% 98% 98% 97%  Weight:      Height:       Weight change:   Intake/Output Summary (Last 24 hours) at 04/23/2024 0831 Last data filed at 04/23/2024 0800 Gross per 24 hour  Intake 6683.03 ml  Output 9008 ml   Net -2324.97 ml       Labs: RENAL PANEL Recent Labs  Lab 04/18/24 0542 04/19/24 0508 04/19/24 2259 04/20/24 0254 04/20/24 0846 04/21/24 0342 04/21/24 1715 04/22/24 0341 04/22/24 1604 04/23/24 0416  NA 150* 152*   < > 140   < > 139 136 136 134* 134*  135  K 3.8 4.3   < > 4.8   < > 5.2* 5.8* 5.1 4.2 4.3  4.4  CL 122* 124*   < > 115*   < > 107 105 103 100 99  101  CO2 23 21*   < > 13*   < > 22 21* 21* 20* 23  24  GLUCOSE 165* 172*   < > 154*   < > 259* 309* 287* 279* 235*  231*  BUN 60* 50*   < > 35*   < > 62* 73* 80* 79* 68*  68*  CREATININE 1.12 1.21   < > 0.84   < > 2.47* 3.13* 3.70* 3.64* 2.85*  2.99*  CALCIUM  8.9 8.7*   < > 7.0*   < > 6.3* 6.1* 6.2* 6.5* 6.7*  6.8*  MG 2.2 2.2  --  1.3*  --   --   --  2.3  --  2.4  PHOS 3.0 2.9  --  3.0  --   --   --  8.5* 7.9* 5.4*  5.2*  ALBUMIN   --   --   --  <1.5*  --   --  2.1*  --  1.8* 1.8*  1.7*   < > = values in this interval not displayed.    Liver Function Tests: Recent Labs  Lab 04/20/24 0254 04/21/24 1715 04/22/24 1604 04/23/24 0416  AST 18 19  --  21  ALT 14 12  --  13  ALKPHOS 29* 48  --  46  BILITOT 0.9 0.7  --  0.7  PROT 3.4* 4.7*  --  4.9*  ALBUMIN  <1.5* 2.1* 1.8* 1.8*  1.7*   No results for input(s): LIPASE, AMYLASE in the last 168 hours. No results for input(s): AMMONIA in the last 168 hours. CBC: Recent Labs    04/21/24 0342 04/21/24 1715 04/21/24 2347 04/22/24 0341 04/23/24 0416  HGB 8.0* 7.8* 8.8* 8.8* 8.0*  MCV 95.4 96.0  --  89.8 89.5    Cardiac Enzymes: No results for input(s): CKTOTAL, CKMB, CKMBINDEX, TROPONINI in the last 168 hours. CBG: Recent Labs  Lab 04/22/24 1612 04/22/24 2003 04/22/24 2349 04/23/24 0442 04/23/24 0810  GLUCAP 242* 230* 231* 223* 232*    Iron Studies: No results for input(s): IRON, TIBC, TRANSFERRIN, FERRITIN in the last 72 hours. Studies/Results: DG Chest Port 1 View Result Date: 04/22/2024 CLINICAL DATA:  Central line  placement. EXAM: PORTABLE CHEST 1 VIEW COMPARISON:  04/20/2024. FINDINGS: Left IJ hemodialysis CVC catheter tip overlies the lower SVC. Right PICC tip overlies the superior cavoatrial junction. Endotracheal tube tip is located proximally 4 cm above the carina. Enteric tube tip and side port within the stomach. Low lung volumes with unchanged cardiomediastinal silhouette. No pneumothorax. Left basilar opacity may reflect atelectasis or infiltrate. Mild right basilar atelectasis. IMPRESSION: 1. Left IJ hemodialysis CVC catheter tip overlies the lower SVC. 2. Additional life-support apparatus, as above. 3. Low lung volumes with left basilar opacity, which may reflect atelectasis or infiltrate. Mild right basilar atelectasis. Electronically Signed   By: Mannie Seek M.D.   On: 04/22/2024 12:25    Medications: Infusions:  sodium chloride  10 mL/hr at 04/21/24 1257   dexmedetomidine  (PRECEDEX ) IV infusion 0.6 mcg/kg/hr (04/23/24 0800)   fentaNYL  infusion INTRAVENOUS 100 mcg/hr (04/23/24 0800)   heparin  10,000 units/ 20 mL infusion syringe 500 Units/hr (04/23/24 0710)   micafungin (MYCAMINE) 150 mg in sodium chloride  0.9 % 100 mL IVPB Stopped (04/22/24 1101)   piperacillin -tazobactam (ZOSYN )  IV 12.5 mL/hr at 04/23/24 0800   prismasol BGK 4/2.5 400 mL/hr at 04/23/24 0130   prismasol BGK 4/2.5 400 mL/hr at 04/23/24 0130   prismasol BGK 4/2.5 1,500 mL/hr at 04/23/24 0552   sodium bicarbonate  150 mEq in sterile water 1,150 mL infusion 100 mL/hr at 04/23/24 0800   TPN ADULT (ION) 90 mL/hr at 04/23/24 0800   vasopressin 0.03 Units/min (04/23/24 0800)    Scheduled Medications:  sodium chloride    Intravenous Once   Chlorhexidine  Gluconate Cloth  6 each Topical Q2200   feeding supplement (VITAL HIGH PROTEIN)  1,000 mL Per Tube Q24H   fentaNYL  (SUBLIMAZE ) injection  25-50 mcg Intravenous Once   hydrocortisone  sod succinate (SOLU-CORTEF ) inj  50 mg Intravenous Q12H   insulin  aspart  0-20 Units  Subcutaneous Q4H   insulin  glargine-yfgn  40 Units Subcutaneous Daily   mouth rinse  15 mL Mouth Rinse Q2H   polyethylene glycol  17 g Per Tube Daily    have reviewed scheduled and prn medications.  Physical Exam:  General: Intubated and sedated, more alert than yesterday. Heart:RRR, s1s2 nl Lungs: Coarse breath sound bilateral.  On mechanical ventilation Abdomen: Multiple abdominal line and drain present, mild distention. Extremities: Peripheral edema present Dialysis Access: Temporary HD line.  Kristopher Thompson 04/23/2024,8:31 AM  LOS: 13 days

## 2024-04-23 NOTE — Progress Notes (Addendum)
 Intraperitoneal surgical drain with creatinine 2.9 which is consistent with his serum creatinine.  That argues against any frank urine leak.  I discussed with Dr [name], pt's urologist who did his radical cystectomy and ileal conduit urostomy 2018.  He visited & discussed with patient's wife.

## 2024-04-23 NOTE — Consult Note (Signed)
 WOC Nurse Consult Note: Reason for Consult: NPWT dressing change  Wound type: surgical 6/10 DOS Pressure Injury POA: NA Measurement:22 cm x 4cm x 4cm mid wound, 6.5cm distal aspect of wound bed  Wound AVW:UJWJX subcutaneous tissues; some false bottoms identified and bluntly opened with gloved finger Drainage (amount, consistency, odor) pocket of serosanguineous fluid at distal aspect; same in VAC canister Periwound: intact Dressing procedure/placement/frequency: Removed old NPWT dressing (2 pc black) Filled wound with__3__ piece of black foam  Sealed NPWT dressing at HG Patient intubated and mildly sedated; Patient tolerated procedure well Noted area of induration and erythema on the right lateral abdomen streaked from the JP site. Marked with marker with bedside nursing permission. Will notify surgeon as well.  CCM at the bedside and they were made aware today and able to visualize with WOC nurse      WOC Nurse ostomy consult note Stoma type/location:  Ileal conduit; RLQ Ileostomy: RUQ Stomal assessment/size:  1.. oval shaped, aprx 1; pink, mucous 2. 1 1/4  round; slightly budded; os at 1 o'clock at skin level Peristomal assessment:  Significant dips in the abdominal topography surrounding IC, patient's wife reports difficulty with maintaining a seal at home, using 1pc flat; barrier ring and warming skin prior to application with hair dryer  2. Intact  Treatment options for stomal/peristomal skin:  Using 2 barrier rings to flatten pouching surface and soft convex urostomy pouch Using 1pc soft convex and barrier ring  Output  Liquid brown/green Dark urine; currently on CRRT in the room Ostomy pouching: 1pc./2pc. see above  Education provided: met with wife, she reports IC present since 2018; patient has been seen in the clinic for issues with leaking after development of significant hernia last seen 05/24/23 per K. Mayo Wife reports patient is independent in care of his  IC, she is mainly support when needed Patient had colostomy reversal in 2019; will begin teaching with new ileostomy when patient has stabilized and has been extubated.  Enrolled patient in [employer] DC program: No, will re-assess when patient status improved.   WOC nurse will continue to provide NPWT dressing changed due to the complexity of the dressing change and ostomy teaching when appropriate  Extra ostomy supplies in the room 1 extra VAC dressing in the room   Priti Consoli [facility], CNS, CWON-AP [phone]

## 2024-04-23 NOTE — Progress Notes (Signed)
 Attending Attestation:   I agree with the impression and plan as outlined Kristopher Myron, NP with emphasis on the following:   Patient is critically ill in setting of septic shock due to bowel perforation s/p repair 6/8 with return to OR 6/10 for end ileostomy. No issues overnight. Renal function still declining with minimal urine ourput. Nephrology consulted and HD line placed for CRRT. Continue vasopressors for MAP 65 or greater. Continue stress dose steroids. Continue zosyn  and micafungin coverage. Continue aggressive sliding scale and 30 units long acting daily.   My cc time 45 minutes minutes  [name], MD [facility] Office: [phone]   See Amion for personal pager PCCM on call pager [phone] until 7pm. Please call Elink 7p-7a. [phone]

## 2024-04-24 DIAGNOSIS — J9602 Acute respiratory failure with hypercapnia: Secondary | ICD-10-CM

## 2024-04-24 DIAGNOSIS — N179 Acute kidney failure, unspecified: Secondary | ICD-10-CM | POA: Diagnosis not present

## 2024-04-24 DIAGNOSIS — R6521 Severe sepsis with septic shock: Secondary | ICD-10-CM | POA: Diagnosis not present

## 2024-04-24 DIAGNOSIS — K430 Incisional hernia with obstruction, without gangrene: Secondary | ICD-10-CM | POA: Diagnosis not present

## 2024-04-24 DIAGNOSIS — A419 Sepsis, unspecified organism: Secondary | ICD-10-CM | POA: Diagnosis not present

## 2024-04-24 LAB — RENAL FUNCTION PANEL
Albumin: 2.1 g/dL — ABNORMAL LOW (ref 3.5–5.0)
Albumin: 2.1 g/dL — ABNORMAL LOW (ref 3.5–5.0)
Anion gap: 11 (ref 5–15)
Anion gap: 11 (ref 5–15)
BUN: 59 mg/dL — ABNORMAL HIGH (ref 8–23)
BUN: 56 mg/dL — ABNORMAL HIGH (ref 8–23)
CO2: 25 mmol/L (ref 22–32)
CO2: 24 mmol/L (ref 22–32)
Calcium: 6.8 mg/dL — ABNORMAL LOW (ref 8.9–10.3)
Calcium: 7.2 mg/dL — ABNORMAL LOW (ref 8.9–10.3)
Chloride: 97 mmol/L — ABNORMAL LOW (ref 98–111)
Chloride: 101 mmol/L (ref 98–111)
Creatinine, Ser: 1.85 mg/dL — ABNORMAL HIGH (ref 0.61–1.24)
Creatinine, Ser: 2.07 mg/dL — ABNORMAL HIGH (ref 0.61–1.24)
GFR, Estimated: 38 mL/min — ABNORMAL LOW (ref >60)
GFR, Estimated: 34 mL/min — ABNORMAL LOW (ref >60)
Glucose, Bld: 154 mg/dL — ABNORMAL HIGH (ref 70–99)
Glucose, Bld: 143 mg/dL — ABNORMAL HIGH (ref 70–99)
Phosphorus: 3.2 mg/dL (ref 2.5–4.6)
Phosphorus: 3.3 mg/dL (ref 2.5–4.6)
Potassium: 3.6 mmol/L (ref 3.5–5.1)
Potassium: 3.8 mmol/L (ref 3.5–5.1)
Sodium: 133 mmol/L — ABNORMAL LOW (ref 135–145)
Sodium: 136 mmol/L (ref 135–145)

## 2024-04-24 LAB — CBC
HCT: 23.3 % — ABNORMAL LOW (ref 39.0–52.0)
Hemoglobin: 7.5 g/dL — ABNORMAL LOW (ref 13.0–17.0)
MCH: 28.1 pg (ref 26.0–34.0)
MCHC: 32.2 g/dL (ref 30.0–36.0)
MCV: 87.3 fL (ref 80.0–100.0)
Platelets: 234 10*3/uL (ref 150–400)
RBC: 2.67 MIL/uL — ABNORMAL LOW (ref 4.22–5.81)
RDW: 14.9 % (ref 11.5–15.5)
WBC: 13 10*3/uL — ABNORMAL HIGH (ref 4.0–10.5)
nRBC: 0.2 % (ref 0.0–0.2)

## 2024-04-24 LAB — GLUCOSE, CAPILLARY
Glucose-Capillary: 140 mg/dL — ABNORMAL HIGH (ref 70–99)
Glucose-Capillary: 142 mg/dL — ABNORMAL HIGH (ref 70–99)
Glucose-Capillary: 149 mg/dL — ABNORMAL HIGH (ref 70–99)
Glucose-Capillary: 151 mg/dL — ABNORMAL HIGH (ref 70–99)
Glucose-Capillary: 153 mg/dL — ABNORMAL HIGH (ref 70–99)
Glucose-Capillary: 157 mg/dL — ABNORMAL HIGH (ref 70–99)
Glucose-Capillary: 164 mg/dL — ABNORMAL HIGH (ref 70–99)

## 2024-04-24 LAB — HEPARIN LEVEL (UNFRACTIONATED): Heparin Unfractionated: 0.6 [IU]/mL (ref 0.30–0.70)

## 2024-04-24 LAB — APTT
aPTT: 189 s (ref 24–36)
aPTT: 167 s (ref 24–36)

## 2024-04-24 LAB — TRIGLYCERIDES: Triglycerides: 195 mg/dL — ABNORMAL HIGH (ref <150)

## 2024-04-24 LAB — MAGNESIUM: Magnesium: 2.4 mg/dL (ref 1.7–2.4)

## 2024-04-24 MED ORDER — VITAL HIGH PROTEIN PO LIQD
1000.0000 mL | ORAL | Status: DC
  Administered 2024-04-24: 1000 mL

## 2024-04-24 MED ORDER — INSULIN GLARGINE-YFGN 100 UNIT/ML ~~LOC~~ SOLN
50.0000 [IU] | Freq: Every day | SUBCUTANEOUS | Status: DC
  Administered 2024-04-25 – 2024-04-27 (×3): 50 [IU] via SUBCUTANEOUS
  Filled 2024-04-24 (×3): qty 0.5

## 2024-04-24 MED ORDER — VITAL HIGH PROTEIN PO LIQD
1000.0000 mL | ORAL | Status: DC
  Administered 2024-04-25 – 2024-04-26 (×2): 1000 mL

## 2024-04-24 MED ORDER — INSULIN ASPART 100 UNIT/ML IJ SOLN
5.0000 [IU] | INTRAMUSCULAR | Status: DC
  Administered 2024-04-24 – 2024-04-28 (×22): 5 [IU] via SUBCUTANEOUS

## 2024-04-24 MED ORDER — INSULIN GLARGINE-YFGN 100 UNIT/ML ~~LOC~~ SOLN
10.0000 [IU] | SUBCUTANEOUS | Status: AC
  Administered 2024-04-24: 10 [IU] via SUBCUTANEOUS
  Filled 2024-04-24: qty 0.1

## 2024-04-24 MED ORDER — HYDROGEN PEROXIDE 3 % EX SOLN
CUTANEOUS | Status: AC
  Filled 2024-04-24: qty 473

## 2024-04-24 MED ORDER — TRAVASOL 10 % IV SOLN
INTRAVENOUS | Status: AC
  Filled 2024-04-24: qty 1008

## 2024-04-24 MED ORDER — VITAL HIGH PROTEIN PO LIQD: 1000.0000 mL | ORAL | Status: DC

## 2024-04-24 NOTE — Progress Notes (Signed)
 [facility] NEPHROLOGY PROGRESS NOTE  Assessment/ Plan: Pt is a 73 y.o. yo male  with past medical history significant for hypertension, DM, CAD, bladder cancer, CHF, right bundle branch block who had acute bowel perforation and status post OR for bowel repair on 6/8, lysis of adhesion, ileal resection end ileostomy hernia repairs, septic shock seen as a consultation for the evaluation and management of AKI and oliguria.   # Acute kidney injury, oliguric with anasarca likely ischemic ATN due to septic shock and obstructive uropathy given bilateral hydronephrosis.   Started CRRT on 6/11 and tolerating well.   All 4K bath, fixed dose of heparin  and a UF around 100-200 cc net negative.  Strict ins and out and monitor lab. Making some urine now - yesterday and BP improved.  Plan cont CRRT through 7am Sat then hold and monitor for recovery.  If cont to need RRT would need to transfer to [facility] for this, do not think transfer needs to happen at this time.   # Bilateral hydronephrosis severe on the left side: S/p revision of ileal conduit 5/30,  urology following.    # Acute bowel perforation is status post bowel repair ileal conduit and hernia repairs urostomy ileal conduit revision.  As per surgical team.    # Septic shock: BPs improved and now off pressors.    # Acute hypoxic respiratory failure/VDRF per pulmonary team.   # Metabolic acidosis managing with sodium bicarbonate .  CRRT as above.  Subjective: Seen and examined in ICU.  Tolerating CRRT well.  Off pressors now.  UOP yesterday!  Discussed with ICU RN.  Clotted yesterday AM but on heparin  gtt now and no issues.   Objective Vital signs in last 24 hours: Vitals:   04/24/24 0900 04/24/24 1000 04/24/24 1128 04/24/24 1200  BP:    (!) 166/67  Pulse: 80 87  95  Resp: 18 17  19   Temp: 97.7 F (36.5 C)   97.9 F (36.6 C)  TempSrc:    Esophageal  SpO2: 99% 99% 99% 100%  Weight:      Height:       Weight change:  -12.6 kg  Intake/Output Summary (Last 24 hours) at 04/24/2024 1321 Last data filed at 04/24/2024 1300 Gross per 24 hour  Intake 6686.63 ml  Output 52841 ml  Net -4783.37 ml       Labs: RENAL PANEL Recent Labs  Lab 04/19/24 0508 04/19/24 2259 04/20/24 0254 04/20/24 0846 04/21/24 1715 04/22/24 0341 04/22/24 1604 04/23/24 0416 04/23/24 1600 04/24/24 0338  NA 152*   < > 140   < > 136 136 134* 134*  135 137 133*  K 4.3   < > 4.8   < > 5.8* 5.1 4.2 4.3  4.4 4.0 3.6  CL 124*   < > 115*   < > 105 103 100 99  101 100 97*  CO2 21*   < > 13*   < > 21* 21* 20* 23  24 24 25   GLUCOSE 172*   < > 154*   < > 309* 287* 279* 235*  231* 205* 154*  BUN 50*   < > 35*   < > 73* 80* 79* 68*  68* 67* 59*  CREATININE 1.21   < > 0.84   < > 3.13* 3.70* 3.64* 2.85*  2.99* 2.71* 1.85*  CALCIUM  8.7*   < > 7.0*   < > 6.1* 6.2* 6.5* 6.7*  6.8* 6.9* 6.8*  MG 2.2  --  1.3*  --   --  2.3  --  2.4  --  2.4  PHOS 2.9  --  3.0  --   --  8.5* 7.9* 5.4*  5.2* 4.2 3.2  ALBUMIN   --    < > <1.5*  --  2.1*  --  1.8* 1.8*  1.7* 1.9* 2.1*   < > = values in this interval not displayed.    Liver Function Tests: Recent Labs  Lab 04/20/24 0254 04/21/24 1715 04/22/24 1604 04/23/24 0416 04/23/24 1600 04/24/24 0338  AST 18 19  --  21  --   --   ALT 14 12  --  13  --   --   ALKPHOS 29* 48  --  46  --   --   BILITOT 0.9 0.7  --  0.7  --   --   PROT 3.4* 4.7*  --  4.9*  --   --   ALBUMIN  <1.5* 2.1*   < > 1.8*  1.7* 1.9* 2.1*   < > = values in this interval not displayed.   No results for input(s): LIPASE, AMYLASE in the last 168 hours. No results for input(s): AMMONIA in the last 168 hours. CBC: Recent Labs    04/21/24 1715 04/21/24 2347 04/22/24 0341 04/23/24 0416 04/24/24 0338  HGB 7.8* 8.8* 8.8* 8.0* 7.5*  MCV 96.0  --  89.8 89.5 87.3    Cardiac Enzymes: No results for input(s): CKTOTAL, CKMB, CKMBINDEX, TROPONINI in the last 168 hours. CBG: Recent Labs  Lab 04/23/24 1948  04/24/24 0003 04/24/24 0342 04/24/24 0743 04/24/24 1149  GLUCAP 168* 164* 149* 151* 157*    Iron Studies: No results for input(s): IRON, TIBC, TRANSFERRIN, FERRITIN in the last 72 hours. Studies/Results: No results found.   Medications: Infusions:  sodium chloride  10 mL/hr at 04/21/24 1257   dexmedetomidine  (PRECEDEX ) IV infusion 0.3 mcg/kg/hr (04/24/24 1300)   fentaNYL  infusion INTRAVENOUS Stopped (04/23/24 1035)   heparin  10,000 units/ 20 mL infusion syringe 750 Units/hr (04/24/24 0830)   heparin  1,700 Units/hr (04/24/24 1300)   micafungin  (MYCAMINE ) 150 mg in sodium chloride  0.9 % 100 mL IVPB Stopped (04/24/24 1012)   piperacillin -tazobactam (ZOSYN )  IV Stopped (04/24/24 0959)   prismasol  BGK 4/2.5 400 mL/hr at 04/24/24 0302   prismasol  BGK 4/2.5 400 mL/hr at 04/24/24 0302   prismasol  BGK 4/2.5 1,500 mL/hr at 04/24/24 1152   TPN ADULT (ION) 75 mL/hr at 04/24/24 1300   TPN ADULT (ION)      Scheduled Medications:  sodium chloride    Intravenous Once   Chlorhexidine  Gluconate Cloth  6 each Topical Q2200   feeding supplement (VITAL HIGH PROTEIN)  1,000 mL Per Tube Q24H   fentaNYL  (SUBLIMAZE ) injection  25-50 mcg Intravenous Once   hydrocortisone  sod succinate (SOLU-CORTEF ) inj  50 mg Intravenous Q12H   insulin  aspart  0-20 Units Subcutaneous Q4H   insulin  aspart  5 Units Subcutaneous Q4H   [START ON 04/25/2024] insulin  glargine-yfgn  50 Units Subcutaneous Daily   mouth rinse  15 mL Mouth Rinse Q2H   polyethylene glycol  17 g Per Tube Daily    have reviewed scheduled and prn medications.  Physical Exam: General: Intubated and sedated, more alert than yesterday. Heart:RRR, s1s2 nl Lungs: Coarse breath sound bilateral.  On mechanical ventilation Abdomen: Multiple abdominal line and drain present, mild distention. Extremities: Peripheral edema present Dialysis Access: Temporary HD line.  Baron Border 04/24/2024,1:21 PM  LOS: 14 days

## 2024-04-24 NOTE — Plan of Care (Signed)
 Patient currently on CRRT, plan is if CRRT clots, discontinue treatment, if not end treatment at 0700, patient tolerating treatment at this time, achieving goals, all lines and tubes patent, tolerating tube feeds, blood glucose being cover with sliding scale insulin  and a basal dose of 5 units every four hours, plan of care and goals discussed with wife and patients son, time given for questions, patient hand book/guide at bedside.  Problem: Education: Goal: Knowledge of General Education information will improve Description: Including pain rating scale, medication(s)/side effects and non-pharmacologic comfort measures Outcome: Progressing   Problem: Health Behavior/Discharge Planning: Goal: Ability to manage health-related needs will improve Outcome: Progressing   Problem: Clinical Measurements: Goal: Ability to maintain clinical measurements within normal limits will improve Outcome: Progressing Goal: Will remain free from infection Outcome: Progressing Goal: Diagnostic test results will improve Outcome: Progressing Goal: Respiratory complications will improve Outcome: Progressing Goal: Cardiovascular complication will be avoided Outcome: Progressing   Problem: Activity: Goal: Risk for activity intolerance will decrease Outcome: Progressing   Problem: Nutrition: Goal: Adequate nutrition will be maintained Outcome: Progressing   Problem: Elimination: Goal: Will not experience complications related to bowel motility Outcome: Progressing Goal: Will not experience complications related to urinary retention Outcome: Progressing   Problem: Pain Managment: Goal: General experience of comfort will improve and/or be controlled Outcome: Progressing   Problem: Safety: Goal: Ability to remain free from injury will improve Outcome: Progressing   Problem: Skin Integrity: Goal: Risk for impaired skin integrity will decrease Outcome: Progressing   Problem: Education: Goal: Ability  to describe self-care measures that may prevent or decrease complications (Diabetes Survival Skills Education) will improve Outcome: Progressing Goal: Individualized Educational Video(s) Outcome: Progressing   Problem: Coping: Goal: Ability to adjust to condition or change in health will improve Outcome: Progressing   Problem: Fluid Volume: Goal: Ability to maintain a balanced intake and output will improve Outcome: Progressing   Problem: Health Behavior/Discharge Planning: Goal: Ability to identify and utilize available resources and services will improve Outcome: Progressing Goal: Ability to manage health-related needs will improve Outcome: Progressing   Problem: Metabolic: Goal: Ability to maintain appropriate glucose levels will improve Outcome: Progressing   Problem: Nutritional: Goal: Maintenance of adequate nutrition will improve Outcome: Progressing Goal: Progress toward achieving an optimal weight will improve Outcome: Progressing   Problem: Skin Integrity: Goal: Risk for impaired skin integrity will decrease Outcome: Progressing   Problem: Tissue Perfusion: Goal: Adequacy of tissue perfusion will improve Outcome: Progressing   Problem: Fluid Volume: Goal: Hemodynamic stability will improve Outcome: Progressing   Problem: Clinical Measurements: Goal: Diagnostic test results will improve Outcome: Progressing Goal: Signs and symptoms of infection will decrease Outcome: Progressing   Problem: Respiratory: Goal: Ability to maintain adequate ventilation will improve Outcome: Progressing   Problem: Safety: Goal: Non-violent Restraint(s) Outcome: Progressing

## 2024-04-24 NOTE — Progress Notes (Signed)
 PHARMACY - TOTAL PARENTERAL NUTRITION CONSULT NOTE   Indication: Prolonged ileus  Patient Measurements: Height: 5' 11 (180.3 cm) Weight: 116.4 kg (256 lb 9.9 oz) IBW/kg (Calculated) : 75.3 TPN AdjBW (KG): 85.6 Body mass index is 35.79 kg/m.  Assessment: Pt is a 78 yoM who underwent hernia repair and urostomy revision on 04/10/24. Started TPN 6/2 for post-op ileus. Return to OR early 6/9 for perforated bowel s/p drainage of abscesses, small bowel resection (in discontinuity) with placement of wound VAC. He returned to the ICU intubated and requiring vasopressor support. On 6/10, patient returned to OR and is now s/p abdominal wall debridement, ileal resection, end ileostomy, hernia repairs and fascia closure w/ placement of wound vac. He was started on trickle feeds. He remains on TPN pending ability to advance tube feeds.  Glucose / Insulin : Hx DM on metformin PTA. A1c 5.3% -CBGs 149-232 last 24h: 40 units SSI last 24h -Semglee  40 units + 3 units /4hrs scheduled Novolog  (18 units/d) + SSI (40 ut) -Stress dose steroids started 6/10 am, now tapering  Electrolytes:  -Na 133 low, Mg/Phos WNL -Phos 5.2 - elevated, but decreasing now that on CRRT -K 3.6 - no K in TPN since 6/9, on 4K bath for CRRT (goal >=4 with ileus) -CorrCa 8.32 - remains just below normal,   Renal: CRRT; only 60 ml UOP yesterday, 100 ml documented this am  Hepatic: albumin  2.1; LFTs WNL;  TG 64 (6/10) now up to 195 (6/13). Was on Propofol  6/9-6/11. Check again Monday.  Intake / Output; MIVF:  -Minimal UOP 365, on CRRT -NG/emesis 45 ml yesterday -Drain > 715 yesterday -Ileostomy OP 450 - CRRT 9477 (I/O +13L)  GI Imaging: -6/9: Air-fluid collection in the subcutaneous soft tissues of the right anterior pelvic wall extending from the midline to the ileostomy -6/3: Right lower quadrant small bowel loop with mural defect and adjacent extraluminal gas identified, which is concerning for underlying small bowel  perforation. GI Surgeries / Procedures:  -6/10: Abdominal wall debridement, ileal resection, end ileostomy, hernia repairs, fascia closure w/ placement of wound vac -6/9: Perforated bowel - drainage of right abdominal wall abscess as well as intra-abdominal abscess, explantation of abdominal mesh, small bowel resection (in discontinuity), placement of wound VAC -5/30: Hernia repair, lysis of adhesions, urostomy revision  Central access: PICC TPN start date: 6/2   Nutritional Goals: - Goal TPN rate is 90 mL/hr (provides 101.5 g of protein and 2047 kcals per day)   Decrease goal TPN rate to 75 mL/hr (provides 100 g of protein and 1465 kcals per day) Tube feeds increased to 30 ml/hr - provides an additional 63 g protein and 720 kcal This will give a total of 163 g protein and 2185 kcals per day meeting 100% of patient's increased needs to account for CRRT and multiple abdominal surgeries  RD Assessment: Estimated Needs Total Energy Estimated Needs: 2100-2300 kcals Total Protein Estimated Needs: 155-180 grams Total Fluid Estimated Needs: >/= 2.1L  Current Nutrition:  TPN : decr to 62ml/hr Tube feeds at 30 ml/hr  Plan: Continue TPN at rate 75 mL/hr - still meeting nutritional needs but attempting to decrease volume with AKI/CRRT and increasing tube feeds 6/12 Add back lipids given patient is no longer on propofol  Vital HP at 63ml/hr Electrolytes in TPN:  Na 75 mEq/L K 0 mEq/L - will leave K out of TPN while on CRRT, currently on 4 K bath (goal >=4 with ileus) Ca 8 mEq/L incr Mg 4 mEq/L   Phos  5 mmol/L -(anticipation of CRRT continuing to decrease phos) Cl:Ac 1:1 Add standard MVI and trace elements to TPN Insulin : Semglee  increased to 50 units daily starting TODAY continues on resistant SSI q4hr  Increase Novolog  to 5 units q4hr TF coverage to be given in addition to the SSI (hold if tube feeds are held/stopped) Na bicarb infusion at 100ml/hr Triglyceride recheck on  Monday Monitor TPN labs on Mon/Thurs at minimum for TPN, currently checking labs BID for CRRT   Hilliard Loyal, PharmD, BCPS Clinical Pharmacist 04/24/2024 7:52 AM

## 2024-04-24 NOTE — Progress Notes (Signed)
 NAME:  Kristopher Thompson, MRN:  [id number], DOB:  1951/07/13, LOS: 14 ADMISSION DATE:  04/10/2024, CONSULTATION DATE:  04/13/2024 REFERRING MD:  Dr Bonita Bussing, CHIEF COMPLAINT: Sepsis  History of Present Illness:  S/p repair of incisional/parastomal and ventral hernias, left inguinal incarcerated , abdominal hernias, lysis of adhesions, urostomy revision Day 1 postoperatively  Transferred to the ICU secondary to tachycardia, tachypnea  History of bladder cancer, prostate cancer, diastolic heart failure, diabetes, right bundle branch block, multiple abdominal surgeries  Update 04/20/24: ccm reconsulted post operatively after pt had perforated bowel and returned to OR. Pt is requiring neo and norepi at this time. He remains intubated at this time. Profoundly acidotic with bicarb of 12. Will start sodium bicarb infusion and bolus prior to initiation. Follow labs in am. Updated wife and son at bedside.   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   moderate atypia - right low back   Atypical nevus 04/04/2007   moderate to marked - right upper back (wider shave)   Atypical nevus 04/04/2007   moderate atypia - center chest (wider shave)   Atypical nevus 04/04/2007   slight atypia - right thigh   Atypical nevus 11/29/2011   mild atypia - center upper back   Atypical nevus 11/29/2011   mild atypia - center chest   Bacteremia due to Klebsiella pneumoniae 10/09/2017   Bladder cancer (HCC) dx 07/2017   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  s/p  cystectomy w/ ileal conduit urinary diversion   Candida infection    CHF (congestive heart failure) (HCC)    Colostomy in place (HCC)    since 08-08-2017-- per pt 12-25-2017 reddness around stoma   Diabetes mellitus without complication (HCC)    GERD (gastroesophageal reflux disease)    H/O hiatal hernia    History of  sepsis 09/2017   dx bacteremia due to klebsiella pneumoniae,  post op intraabdominal abscess   Prostate cancer [facility]) urologist-- dr Rozanne Corners   10-02-2012 s/p  prostatectomy-- Stage T1c   RBBB    Renal disorder    pt. denies   Sleep apnea    cpap   Squamous cell carcinoma of skin 05/22/2013   left cheek - CX3 + 5FU   Wears glasses    Significant Hospital Events: Including procedures, antibiotic start and stop dates in addition to other pertinent events   04/10/2024-laparoscopic surgery 04/13/24 CCM consult. Incr NE. Art line  6/3 weaning NE. Gently diuresed w 3L off  6/4 off NE. Pulled out his NGT refused replacement. After multiple emesis  6/5 cont NGT 6/6 tx to med surg 6/9: taken back to OR for perforated bowel, post-op shock 6/10: Norepinephrine , Phenylephrine , and vasopressin , added steroids, 1u PRBCs, back to OR for washout and closure 6/11: Worsening AKI with fluid overload, start CRRT, decreasing pressor requirements 6/13 tolerating CRRT  Interim History / Subjective:   Tolerated weaning all day yesterday Off pressors On CRRT Able to follow one-step commands  Objective    Blood pressure 119/60, pulse 82, temperature (!) 97.5 F (36.4 C), resp. rate (!) 24, height 5' 11 (1.803 m), weight 116.4 kg, SpO2 99%. CVP:  [1 mmHg-16 mmHg] 13 mmHg  Vent Mode: PRVC FiO2 (%):  [30 %-40 %] 30 % Set Rate:  [25 bmp] 25 bmp Vt Set:  [600 mL] 600  mL PEEP:  [5 cmH20-8 cmH20] 8 cmH20 Pressure Support:  [5 cmH20-10 cmH20] 8 cmH20 Plateau Pressure:  [18 cmH20-20 cmH20] 18 cmH20   Intake/Output Summary (Last 24 hours) at 04/24/2024 0725 Last data filed at 04/24/2024 0700 Gross per 24 hour  Intake 6901.54 ml  Output 16109 ml  Net -4150.46 ml   Filed Weights   04/20/24 0500 04/22/24 1309 04/24/24 0547  Weight: 112.1 kg 129 kg 116.4 kg    Examination: General: Intubated, acutely ill-appearing Neuro: Moist oral mucosa, follows one-step commands  HEENT: Endotracheal tube in  place RESP: Clear breath sounds auscultation anteriorly CV: S1-S2 appreciated, peripheral edema GI: distant but present bowel sounds, abdomen distended but soft, colostomy present in RUQ with dark brown output and wound vac covering abdominal incision site, x2 JP drains in place with serosanguinous output  GU: urostomy with no urine present Skin: warm and dry, abdominal wound covered with wound vac   Resolved problem list    Assessment and Plan   Septic shock due to acute bowel perforation in OR 6/9.  Back to the OR 6/10 for closure and ilial resection and end ileostomy creation Concern for possible ilial conduit leak-less likely -On antibiotics and antifungal -Tapering stress dose steroids -Weaned off pressors -Blood cultures-no growth -Abdominal culture showing gram-positive cocci in pairs chains, rare yeast gram-negative rods  AKI Metabolic acidosis Electrolyte derangement - On CRRT - On bicarb drip -Appreciate nephrology following for CRRT needs  Acute hypoxemic and hypercarbic respiratory failure  Background history of OSA on CPAP therapy - Continue mechanical ventilator support - Did not tolerate weaning - Will attempt weaning again today - Sedation needs to RASS goal of -1 - On Precedex  at 0.4  Hyperglycemia, history of diabetes - Continue SSI  Anemia Right flank hematoma History of PE-on Eliquis at home -Continue heparin   History of congestive heart failure - Continue to trend CVP -  Moderate protein calorie malnutrition - Tube feeds will be advanced   Best Practice (right click and Reselect all SmartList Selections daily)   Diet/type: tubefeeds on tpn DVT prophylaxis systemic heparin   Pressure ulcer(s): N/A GI prophylaxis: H2B Lines: Central line, PICC art line  Foley:  N/A urostomy  Code Status:  full code Last date of multidisciplinary goals of care discussion [pending per primary team]   The patient is critically ill with multiple organ  systems failure and requires high complexity decision making for assessment and support, frequent evaluation and titration of therapies, application of advanced monitoring technologies and extensive interpretation of multiple databases. Critical Care Time devoted to patient care services described in this note independent of APP/resident time (if applicable)  is 35 minutes.   Myer Artis MD [facility] Personal pager: See Amion If unanswered, please page CCM On-call: #[phone]

## 2024-04-24 NOTE — Plan of Care (Signed)
  Problem: Clinical Measurements: Goal: Ability to maintain clinical measurements within normal limits will improve Outcome: Progressing Goal: Diagnostic test results will improve Outcome: Progressing Goal: Respiratory complications will improve Outcome: Progressing Goal: Cardiovascular complication will be avoided Outcome: Progressing   Problem: Nutrition: Goal: Adequate nutrition will be maintained Outcome: Progressing   Problem: Elimination: Goal: Will not experience complications related to bowel motility Outcome: Progressing

## 2024-04-24 NOTE — TOC Progression Note (Signed)
 Transition of Care [facility]) - Progression Note    Patient Details  Name: Kristopher Thompson MRN: [id number] Date of Birth: 1951/04/11  Transition of Care [facility]) CM/SW Contact  Tessie Fila, RN Phone Number: 04/24/2024, 3:21 PM  Clinical Narrative:    Pt remains in ICU, on ventilator. Possibility pt could transfer to [facility]. TOC continuing to follow.     Expected Discharge Plan: Home/Self Care Barriers to Discharge: Continued Medical Work up  Expected Discharge Plan and Services   Discharge Planning Services: CM Consult   Living arrangements for the past 2 months: Single Family Home                                       Social Determinants of Health (SDOH) Interventions SDOH Screenings   Food Insecurity: No Food Insecurity (04/12/2024)  Housing: Low Risk  (04/12/2024)  Transportation Needs: No Transportation Needs (04/12/2024)  Utilities: Not At Risk (04/12/2024)  Social Connections: Socially Integrated (04/10/2024)  Tobacco Use: Medium Risk (04/10/2024)    Readmission Risk Interventions    04/12/2024    4:58 PM  Readmission Risk Prevention Plan  Transportation Screening Complete  PCP or Specialist Appt within 3-5 Days Complete  HRI or Home Care Consult Complete  Social Work Consult for Recovery Care Planning/Counseling Complete  Palliative Care Screening Not Applicable  Medication Review Oceanographer) Complete

## 2024-04-24 NOTE — Progress Notes (Signed)
 PHARMACY - ANTICOAGULATION CONSULT NOTE  Pharmacy Consult for Heparin  Indication: h/o PE  Allergies  Allergen Reactions   Demerol  [Meperidine ] Nausea And Vomiting and Other (See Comments)    SEVERE NAUSEA    Patient Measurements: Height: 5' 11 (180.3 cm) Weight: 116.4 kg (256 lb 9.9 oz) IBW/kg (Calculated) : 75.3 HEPARIN  DW (KG): 100.8  Vital Signs: Temp: 97.7 F (36.5 C) (06/13 0800) Temp Source: Oral (06/13 0543) Pulse Rate: 78 (06/13 0800)  Labs: Recent Labs    04/22/24 0341 04/22/24 1604 04/23/24 0416 04/23/24 1600 04/23/24 1937 04/24/24 0338 04/24/24 0601 04/24/24 0745  HGB 8.8*  --  8.0*  --   --  7.5*  --   --   HCT 27.2*  --  24.7*  --   --  23.3*  --   --   PLT 283  --  237  --   --  234  --   --   APTT  --   --  37*  --   --  189* 167*  --   HEPARINUNFRC  --   --   --   --  0.56  --   --  0.60  CREATININE 3.70*   < > 2.85*  2.99* 2.71*  --  1.85*  --   --    < > = values in this interval not displayed.    Estimated Creatinine Clearance: 47.5 mL/min (A) (by C-G formula based on SCr of 1.85 mg/dL (H)).   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   moderate atypia - right low back   Atypical nevus 04/04/2007   moderate to marked - right upper back (wider shave)   Atypical nevus 04/04/2007   moderate atypia - center chest (wider shave)   Atypical nevus 04/04/2007   slight atypia - right thigh   Atypical nevus 11/29/2011   mild atypia - center upper back   Atypical nevus 11/29/2011   mild atypia - center chest   Bacteremia due to Klebsiella pneumoniae 10/09/2017   Bladder cancer (HCC) dx 07/2017   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  s/p  cystectomy w/ ileal conduit urinary diversion   Candida infection    CHF (congestive heart failure) (HCC)    Colostomy in place [facility])    since 08-08-2017-- per pt 12-25-2017 reddness  around stoma   Diabetes mellitus without complication (HCC)    GERD (gastroesophageal reflux disease)    H/O hiatal hernia    History of sepsis 09/2017   dx bacteremia due to klebsiella pneumoniae,  post op intraabdominal abscess   Prostate cancer [facility]) urologist-- dr Rozanne Corners   10-02-2012 s/p  prostatectomy-- Stage T1c   RBBB    Renal disorder    pt. denies   Sleep apnea    cpap   Squamous cell carcinoma of skin 05/22/2013   left cheek - CX3 + 5FU   Wears glasses     Assessment: AC/Heme: Eliquis PTA (LD 5/27) for h/o PE. >> heparin  drip - R flank hematoma: RN reports stable - Hep level 06, Hgb 7.5 down today,   Goal of Therapy:  Heparin  level 0.3-0.7 units/ml Monitor platelets by anticoagulation protocol: Yes   Plan:  - IV heparin  1700 units/hr + 750 units/hr in CRRT circuit - Daily HL and CBC, d/c aPTTs   [employer],  PharmD, BCPS Clinical Staff Pharmacist Enis Harsh Stillinger 04/24/2024,9:20 AM

## 2024-04-24 NOTE — Progress Notes (Signed)
 eLink Physician-Brief Progress Note Patient Name: KNUTE MAZZUCA DOB: 07-10-1951 MRN: [id number]   Date of Service  04/24/2024  HPI/Events of Note  aPTT resulted at 189  eICU Interventions  Repeat aPTT ordered to verify results.     Intervention Category Intermediate Interventions: Diagnostic test evaluation Minor Interventions: Clinical assessment - ordering diagnostic tests  Marlynn Singer 04/24/2024, 5:51 AM

## 2024-04-24 NOTE — Progress Notes (Signed)
   04/24/24 1128  Vent Select  Invasive or Noninvasive Invasive  Adult Vent Y  Airway 8 mm  Placement Date/Time: 04/20/24 (c) 0021   Grade View: Grade 2  Airway Device: Endotracheal Tube  Laryngoscope Blade: 4  ETT Types: Oral  Size (mm): 8 mm  Cuffed: Min.occ.pres.  Insertion attempts: 1  Airway Equipment: Stylet  Placement Confirmation: ET...  Secured at (cm) 24 cm  Measured From Lips  Secured Location Right  Secured By English as a second language teacher No  Tube Holder Repositioned Yes  Prone position No  Head position Right  Cuff Pressure (cm H2O) Green OR 18-26 CmH2O  Site Condition Drainage (Comment)  Adult Ventilator Settings  Vent Type Servo i  Humidity HME  Vent Mode CPAP;PSV  FiO2 (%) 30 %  Pressure Support (S)  10 cmH20 (Weaned to PSV 10.)  PEEP 5 cmH20  Adult Ventilator Measurements  Peak Airway Pressure 18 L/min  Mean Airway Pressure 8 cmH20  Resp Rate Spontaneous 17 br/min  Resp Rate Total 17 br/min  Spont TV 701 mL  Measured Ve 12.3 L  Total PEEP 5 cmH20  SpO2 99 %  Adult Ventilator Alarms  Alarms On Y  Ve High Alarm 18 L/min  Ve Low Alarm 8 L/min  Resp Rate High Alarm 35 br/min  Resp Rate Low Alarm 10  PEEP Low Alarm 4 cmH2O  Press High Alarm 40 cmH2O  T Apnea 20 sec(s)  VAP Prevention  HOB> 30 Degrees Y  Breath Sounds  Bilateral Breath Sounds Rhonchi;Diminished  Vent Respiratory Assessment  Level of Consciousness Responds to Pain  Respiratory Pattern Regular;Unlabored  Patient Tolerance Tolerated well  Suction Method  Respiratory Interventions Oral suction;Airway suction  Oral Suctioning/Secretions  Suction Type Oral  Suction Device Yankauer  Secretion Amount Small  Secretion Color White  Secretion Consistency Thin  Suction Tolerance Tolerated well  Suctioning Adverse Effects None  Airway Suctioning/Secretions  Suction Type ETT  Suction Device  Catheter  Secretion Amount Small  Secretion Color White  Secretion Consistency Thin   Suction Tolerance Tolerated fairly well  Suctioning Adverse Effects None

## 2024-04-24 NOTE — Progress Notes (Signed)
 Patient ID: Kristopher Thompson, male   DOB: 12-21-1950, 73 y.o.   MRN: [id number]  3 Days Post-Op Subjective: Kristopher Thompson remains intubated and sedated and critically ill.  Still on CVVHD  Objective: Vital signs in last 24 hours: Temp:  [97.5 F (36.4 C)-99.1 F (37.3 C)] 97.5 F (36.4 C) (06/13 0645) Pulse Rate:  [55-98] 82 (06/13 0645) Resp:  [11-30] 24 (06/13 0645) SpO2:  [94 %-100 %] 99 % (06/13 0645) Arterial Line BP: (88-183)/(41-72) 147/63 (06/13 0645) FiO2 (%):  [30 %-40 %] 30 % (06/13 0314) Weight:  [116.4 kg] 116.4 kg (06/13 0547)  Intake/Output from previous day: 06/12 0701 - 06/13 0700 In: 6901.5 [I.V.:5147.8; NG/GT:725.3; IV Piggyback:1028.4] Out: 40981 [Urine:365; Emesis/NG output:45; Drains:715; Stool:450] Intake/Output this shift: No intake/output data recorded.  Physical Exam:  Abd: Urostomy appears viable.  Urine clear.  Lab Results: Recent Labs    04/22/24 0341 04/23/24 0416 04/24/24 0338  HGB 8.8* 8.0* 7.5*  HCT 27.2* 24.7* 23.3*      Latest Ref Rng & Units 04/24/2024    3:38 AM 04/23/2024    4:16 AM 04/22/2024    3:41 AM  CBC  WBC 4.0 - 10.5 K/uL 13.0  17.0  27.6   Hemoglobin 13.0 - 17.0 g/dL 7.5  8.0  8.8   Hematocrit 39.0 - 52.0 % 23.3  24.7  27.2   Platelets 150 - 400 K/uL 234  237  283      BMET Recent Labs    04/23/24 1600 04/24/24 0338  NA 137 133*  K 4.0 3.6  CL 100 97*  CO2 24 25  GLUCOSE 205* 154*  BUN 67* 59*  CREATININE 2.71* 1.85*  CALCIUM  6.9* 6.8*     Studies/Results: DG Chest Port 1 View Result Date: 04/22/2024 CLINICAL DATA:  Central line placement. EXAM: PORTABLE CHEST 1 VIEW COMPARISON:  04/20/2024. FINDINGS: Left IJ hemodialysis CVC catheter tip overlies the lower SVC. Right PICC tip overlies the superior cavoatrial junction. Endotracheal tube tip is located proximally 4 cm above the carina. Enteric tube tip and side port within the stomach. Low lung volumes with unchanged cardiomediastinal silhouette. No pneumothorax.  Left basilar opacity may reflect atelectasis or infiltrate. Mild right basilar atelectasis. IMPRESSION: 1. Left IJ hemodialysis CVC catheter tip overlies the lower SVC. 2. Additional life-support apparatus, as above. 3. Low lung volumes with left basilar opacity, which may reflect atelectasis or infiltrate. Mild right basilar atelectasis. Electronically Signed   By: Mannie Seek M.D.   On: 04/22/2024 12:25    Assessment/Plan: UOP improving.  Drain Cr from yesterday consistent with serum (no urine leak).  Will continue to follow and help as indicated.   LOS: 14 days   [name] 04/24/2024, 7:46 AM

## 2024-04-24 NOTE — Progress Notes (Signed)
 Pt continues to not make eye contact. Pt will occasionally squeeze fingers when asked, though only for a short period of time. When this nurse asked the pt to squeeze in affirmative response to questions, the pt did not squeeze when asked if in pain, though the pt did squeeze when asked if he would like to have the tube removed. This occurred on two occasions.

## 2024-04-24 NOTE — Progress Notes (Signed)
   04/24/24 0837  Vent Select  Invasive or Noninvasive Invasive  Adult Vent Y  Airway 8 mm  Placement Date/Time: 04/20/24 (c) 0021   Grade View: Grade 2  Airway Device: Endotracheal Tube  Laryngoscope Blade: 4  ETT Types: Oral  Size (mm): 8 mm  Cuffed: Min.[email or web address].  Insertion attempts: 1  Airway Equipment: Stylet  Placement Confirmation: ET...  Secured at (cm) 24 cm  Measured From Lips  Secured Location Left  Secured By English as a second language teacher No  Tube Holder Repositioned Yes  Prone position No  Head position Right  Cuff Pressure (cm H2O) Green OR 18-26 CmH2O  Site Condition Drainage (Comment)  Adult Ventilator Settings  Vent Type Servo i  Humidity HME  Vent Mode (S)  CPAP;PSV  FiO2 (%) (S)  30 %  Pressure Support (S)  12 cmH20  PEEP (S)  5 cmH20  Adult Ventilator Measurements  Peak Airway Pressure 17 L/min  Mean Airway Pressure 9 cmH20  Resp Rate Spontaneous 22 br/min  Resp Rate Total 22 br/min  Spont TV 697 mL  Measured Ve 14.7 L  Total PEEP 5 cmH20  SpO2 100 %  Adult Ventilator Alarms  Alarms On Y  Ve High Alarm 18 L/min  Ve Low Alarm 8 L/min  Resp Rate High Alarm 35 br/min  Resp Rate Low Alarm 10  PEEP Low Alarm 4 cmH2O  Press High Alarm 40 cmH2O  T Apnea 20 sec(s)  VAP Prevention  HOB> 30 Degrees Y  Daily Weaning Assessment  Daily Assessment of Readiness to Wean Wean protocol criteria met (SBT performed)  SBT Method  (PSV 12/5.)  Weaning Start Time 0837  Breath Sounds  Bilateral Breath Sounds Rhonchi;Diminished  Vent Respiratory Assessment  Level of Consciousness Responds to Pain  Respiratory Pattern Regular;Unlabored  Suction Method  Respiratory Interventions Airway suction;Oral suction  Oral Suctioning/Secretions  Suction Type Oral  Suction Device Yankauer  Secretion Amount Small  Secretion Color White  Secretion Consistency Thin  Suction Tolerance Tolerated well  Suctioning Adverse Effects None  Airway Suctioning/Secretions  Suction  Type ETT  Suction Device  Catheter  Secretion Amount Small  Secretion Color White  Secretion Consistency Thin  Suction Tolerance Tolerated well  Suctioning Adverse Effects None

## 2024-04-24 NOTE — Progress Notes (Signed)
 04/24/2024  Kristopher Thompson [id number] December 03, 1950  CARE TEAM: PCP: Dudley Ghee, MD  Outpatient Care Team: Patient Care Team: Dudley Ghee, MD as PCP - General (Internal Medicine) Devon Fogo, MD (Inactive) as Consulting Physician (Dermatology) Lavina Pou, MD as Referring Physician (Pulmonary Disease) Candyce Champagne, MD as Consulting Physician (General Surgery) Florencio Hunting, MD as Consulting Physician (Urology)  Inpatient Treatment Team: Treatment Team:  Candyce Champagne, MD Etter Hermann., MD Florencio Hunting, MD Pccm, Md, MD Neila Bally, RRT Clementine Cutting, MD Larayne Platter, RN Digna Fraise, NT Monty App, RN Black, Algie Ingle, NT [place], [place] I, RN Mazon, [place], [place] Reymundo Caulk, RN   Problem List:   Principal Problem:   Incarcerated incisional hernia Active Problems:   S/P ileal conduit [facility])   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 08/08/2017   GERD (gastroesophageal reflux disease)   Obesity (BMI 35.0-39.9 without comorbidity)   Prolonged QT interval   Non-recurrent bilateral inguinal hernia without obstruction or gangrene   Sinus tachycardia   Tachypnea   Acute respiratory insufficiency, postoperative   Sepsis due to undetermined organism (HCC)   Lactic acidosis   Class 2 obesity   Chronic anticoagulation   Hearing loss   History of bladder cancer   Obstructive sleep apnea of adult   Parastomal hernia of ileal conduit   Partial small bowel obstruction (HCC)   Personal history of PE (pulmonary embolism)   04/10/2024  POST-OPERATIVE DIAGNOSIS:  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   PROCEDURE:   -ROBOTIC LYSIS OF ADHESIONS X 4 HOURS -COMPONENT SEPARATION - TRANSVERSUS ABDOMINIS REALEASE (TAR) BILATERAL -ROBOTIC REPAIRS OF  INCISIONAL, PARASTOMAL, LEFT INGUINAL  INCARCERATED ABDOMINAL WALL HERNIAS WITH MESH -UROSTOMY ILEAL CONDUIT REVISION -INTRAOPERATIVE ASSESSMENT  OF TISSUE VASCULAR PERFUSION USING ICG (indocyanine green ) -IMMUNOFLUORESCENCE -TRANSVERSUS ABDOMINIS PLANE (TAP) BLOCK - BILATERAL    SURGEON:  Eddye Goodie, MD  OR FINDINGS:  Patient had dense adhesions anterior abdominal wall.  Largest hernia was in the right lower quadrant around the ileal conduit urostomy.  16 x 12 cm region.  Incarcerated with over a foot of small bowel.  Midline next largest 9 x 8 cm incarcerated with numerous loops of small bowel.  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  Another direct space hernia in the left lower quadrant and indirect hernia on the left lower quadrant.  No obvious hernias on the right side.   Component separation's done on both sides to try and get the largest midline and parastomal hernias close down with running serrated try to fix absorbable suture.  Broad underlay repair with 30 x 35 cm mesh transverse  04/20/2024 POST-OPERATIVE DIAGNOSIS:  perforated bowel; history of robotic lysis of adhesions x 4 hours, component separation-transversus abdominis release bilateral, robotic repairs of incisional, parastomal, left inguinal incarcerated abdominal wall hernias with mesh; urostomy ileal conduit revision by Dr. [name] on May 30   PROCEDURE:  Drainage of right abdominal wall abscess as well as intra-abdominal abscess Explantation of abdominal mesh Small bowel resection (in discontinuity) Placement of ABThera wound VAC   SURGEON:  Aldean Hummingbird, MD  ASSISTANTS: Adalberto Acton, MD   04/21/2024  POST-OPERATIVE DIAGNOSIS:  DELAYED BOWEL PERFORATION WITH OPEN ABDOMEN   PROCEDURE:   LYSIS OF ADHESIONS X ABDOMINAL WALL DEBRIDEMENT ILEAL RESECTION END ILEOSTOMY ABDOMINAL WALL PARASTOMAL & INCISIONAL HERNIA REPAIRS WITH PHASIX MESH FASCIA CLOSURE WITH WOUND VAC PLACEMENT IN SQ   SURGEON:  Eddye Goodie, MD   ASSISTANT:  [name], MD   Ostomy:  Right upper quadrant: End ileostomy Right lower quadrant:  Urostomy ileal conduit   Drains: 19 Fr Blake drains x2   Right lower quadrant drain rests in the right panniculus abdominal wall between the Phasix mesh and abdominal wall   Left lower quadrant drain rest in the anterior peritoneum between the mesh and the bowel with the tip resting in the right lower quadrant near the base of the ileal conduit and prior bowel resection  Assessment [facility] Stay = 14 days) 3 Days Post-Op    Critical  Plan:  Shock resolving off pressors a guardedly hopeful sign  Antibiotics. Zosyn /Micafungin  6/10-.  Culture showing rare yeast.  Anticipate he will need that for several weeks.  Perhaps 6.  Hopefully to quantify if this is Candida albicans that is sensitive to fluconazole  and can transition over to enteral route.  I would not switch it just yet given the severity of his infection and condition.  Tolerating tube feeds with some ileostomy bowel function.  Advance to goal if there is not planning for extubation.  Vent support.  Minimal settings a guarded sign that he is not fully is going into worsening respiratory failure.  Oliguria   Urostomy with increased urine output while on CRRT a guardedly hopeful sign.  Intraperitoneal drain near ileal conduit without elevated creatinine argues against any urine leak.  Dr. Rozanne Corners with urology agrees  CRRTx.  He is tolerating rather well without increased pressor support guardedly hopeful sign.  TPN to avoid malnutrition.  Change wound vac q TueThuSat  Follow mental status.  Most likely developing ICU psychosis/metabolic encephalopathy with all the stresses he has been through.  Defer to critical care about finding regimen that hopefully will sedate him.  Precedex  base may help him clear his chronic need for opiates during his shock episodes and vent.  CT of chest negative for any obvious pulmonary embolism or pneumonia.  Oxygen needs less.  Echocardiogram shows grade 1 diastolic dysfunction which I believe  is his baseline.  Defer to critical care/internal medicine they feel further is needed aside for some monitor diuresis.  ABLA on top of anemia of chronic disease improved with transfusion.  Follow.  CCM wanting him back on heparin  drip to avoid any clotting events since his history of PE  -monitor electrolytes & replace as needed.  Keep K>4, Mg>2, Phos>3.    -Diabetes.  Sliding scale insulin .  Some hyperglycemia not surprising with TPN and trophic tube feeds.  Agree with adjusting to resistant sliding scale 6/10.  Poss insulin  drip if he persists - defer to CCM  -VTE prophylaxis- SCDs.  Anticoagulation prophyllaxis SQ as appropriate.  Full anticoagulation heparin  drip given history of pulmonary embolism, CCM wished to be aggressive with hep gtt - follow Hgb & BP closely.    I updated the patient's status to the the patient's wife & ICU nurses at bedside & ICU nurse in room.  Recommendations were made.  Questions were answered.  They expressed understanding & appreciation.  -Disposition: He is going to be here a while       I reviewed nursing notes, last 24 h vitals and pain scores, last 48 h intake and output, last 24 h labs and trends, and last 24 h imaging results.  I have reviewed this patient's available data, including medical history, events of note, test results, etc as part of my evaluation.   A significant portion of  that time was spent in counseling. Care during the described time interval was provided by me.  This care required high  level of medical decision making.  04/24/2024    Subjective: (Chief complaint)  Pressors weaned off.  Wife and ICU team in room.  Having some bowel function out ileostomy.  Starting to make more urine to her urostomy bag.  Waking up more but not consistently following commands.  Switched from fentanyl  based regimen to Precedex  based regimen.  Objective:  Vital signs:  Vitals:   04/24/24 0600 04/24/24 0615 04/24/24 0630 04/24/24  0645  BP:      Pulse: 88 73 74 82  Resp: (!) 26 (!) 25 (!) 24 (!) 24  Temp: (!) 97.5 F (36.4 C) (!) 97.5 F (36.4 C) (!) 97.5 F (36.4 C) (!) 97.5 F (36.4 C)  TempSrc:      SpO2: 100% 97% 99% 99%  Weight:      Height:        Last BM Date : 04/17/24  Intake/Output   Yesterday:  06/12 0701 - 06/13 0700 In: 6901.5 [I.V.:5147.8; NG/GT:725.3; IV Piggyback:1028.4] Out: [address] [Urine:365; Emesis/NG output:45; Drains:715; Stool:450] This shift:  No intake/output data recorded.  Bowel function:  Flatus: YES  BM:  YES -small  Drains:  RLQ drain (rests between abdominal wall and Phasix mesh): Serous    LLQ drain (runs over bowel with tip down in RLQ pelvis near base of ileal conduit and site of bowel resection):  thinly serosanguinous   Wound vac (in SQ over closed fascia):  serosanguineous  Physical Exam:  General: Pt intubated and sedated.  Not agitated Eyes: PERRL, normal EOM.  Sclera clear.  No icterus Neuro: CN II-XII intact w/o focal sensory/motor deficits. Lymph: No head/neck/groin lymphadenopathy Psych:  No delerium/psychosis/paranoia.  No agitation HENT: Normocephalic, Mucus membranes moist.  No thrush.  ETT & NGT in place Neck: Supple, No tracheal deviation.  No obvious thyromegaly Chest: No pain to chest wall compression.  Good respiratory excursion.   CV:  Pulses intact.  Regular rhythm.  1-2+ BUE/BLE edema MS:  No obvious deformity  Abdomen:  Obese but Soft.  Nondistended.  Wound vac in midline.    Flank ecchymosis bilateral posterior stable to slightly resolving.  Edema along right flank and panniculus with mild erythema stable to slightly improved.  RUQ: (End ileostomy): Molasses thick black effluent in bag low volume RLQ:  (Urostomy ileal conduit): pink with clear light yellow-colored urine  Ext:   No deformity.  No mjr edema.  No cyanosis Skin: No petechiae / purpurea.  No major sores.  Warm and dry    Results:   Cultures: Recent Results (from  the past 720 hours)  Culture, blood (Routine X 2) w Reflex to ID Panel     Status: None   Collection Time: 04/13/24  7:13 AM   Specimen: BLOOD  Result Value Ref Range Status   Specimen Description   Final    BLOOD BLOOD LEFT ARM AEROBIC BOTTLE ONLY ANAEROBIC BOTTLE ONLY Performed at [facility], [address], [place], [place] [address]    Special Requests   Final    BOTTLES DRAWN AEROBIC AND ANAEROBIC Blood Culture results may not be optimal due to an inadequate volume of blood received in culture bottles Performed at [facility], [address] [place], [place] [address]    Culture   Final    NO GROWTH 5 DAYS Performed at [facility], [address] [place], [place] [address]    Report Status 04/18/2024 FINAL  Final  Culture, blood (Routine X 2) w Reflex to ID Panel     Status: None   Collection Time: 04/13/24  7:20 AM   Specimen: BLOOD  Result Value Ref Range Status   Specimen Description   Final    BLOOD BLOOD RIGHT ARM AEROBIC BOTTLE ONLY ANAEROBIC BOTTLE ONLY Performed at [facility], [address] [place], [place] [address]    Special Requests   Final    BOTTLES DRAWN AEROBIC AND ANAEROBIC Blood Culture results may not be optimal due to an inadequate volume of blood received in culture bottles Performed at [facility], [address], [place], [place] [address]    Culture   Final    NO GROWTH 5 DAYS Performed at [facility], [name] [place], [place] [address]    Report Status 04/18/2024 FINAL  Final  Urine Culture     Status: None   Collection Time: 04/13/24 10:18 AM   Specimen: Urine, Clean Catch  Result Value Ref Range Status   Specimen Description   Final    URINE, CLEAN CATCH Performed at [facility], [address] [place], [place] [address]    Special Requests   Final    NONE Performed at [facility], [address] [place], [place] [address]    Culture   Final    NO GROWTH Performed at [facility], [address] [place], [place] [address]    Report Status 04/14/2024 FINAL  Final  MRSA Next Gen by PCR, Nasal     Status: None   Collection Time: 04/13/24 11:35 AM   Specimen: Nasal Mucosa; Nasal Swab  Result Value Ref Range Status   MRSA by PCR Next Gen NOT DETECTED NOT DETECTED Final    Comment: (NOTE) The GeneXpert MRSA Assay (FDA approved for NASAL specimens only), is one component of a comprehensive MRSA colonization surveillance program. It is not intended to diagnose MRSA infection nor to guide or monitor treatment for MRSA infections. Test performance is not FDA approved in patients less than 96 years old. Performed at [facility], [address], [place], [place] [address]   Culture, blood (Routine X 2) w Reflex to ID Panel     Status: None (Preliminary result)   Collection Time: 04/20/24  8:46 AM   Specimen: BLOOD RIGHT ARM  Result Value Ref Range Status   Specimen Description   Final    BLOOD RIGHT ARM Performed at [facility], [address], [place], [place] [address]    Special Requests   Final    BOTTLES DRAWN AEROBIC AND ANAEROBIC Blood Culture results may not be optimal due to an inadequate volume of blood received in culture bottles Performed at [facility], [address], [place], [place] [address]    Culture   Final    NO GROWTH 4 DAYS Performed at [facility], [address] [place], [place] [address]    Report Status PENDING  Incomplete  Culture, blood (Routine X 2) w Reflex to ID Panel     Status: None (Preliminary result)   Collection Time: 04/20/24  8:49 AM   Specimen: BLOOD  Result Value Ref Range Status   Specimen Description   Final    BLOOD Performed at [facility], [address], [place],  [place] [address]    Special Requests   Final    BOTTLES DRAWN AEROBIC AND ANAEROBIC  Blood Culture results may not be optimal due to an inadequate volume of blood received in culture bottles Performed at [facility], [address], [place], [place] [address]    Culture   Final    NO GROWTH 4 DAYS Performed at [facility], [address] [place], [place] [address]    Report Status PENDING  Incomplete  Culture, Respiratory w Gram Stain     Status: None   Collection Time: 04/20/24 12:03 PM   Specimen: Tracheal Aspirate; Respiratory  Result Value Ref Range Status   Specimen Description   Final    TRACHEAL ASPIRATE Performed at [facility], [address], [place], [place] [address]    Special Requests   Final    NONE Performed at [facility], [address] [place], [place] [address]    Gram Stain NO WBC SEEN RARE GRAM POSITIVE COCCI   Final   Culture   Final    RARE Normal respiratory flora-no Staph aureus or Pseudomonas seen Performed at [facility], [address], [place], [place] [address]    Report Status 04/23/2024 FINAL  Final  Aerobic/Anaerobic Culture w Gram Stain (surgical/deep wound)     Status: None (Preliminary result)   Collection Time: 04/21/24  2:23 PM   Specimen: Soft Tissue, Other  Result Value Ref Range Status   Specimen Description   Final    TISSUE INTRA ABDOMINAL NECROTIC TISSUE Performed at [facility], [address] [place], [place] [address]    Special Requests   Final    NONE Performed at [facility], [address] [place], [place] [address]    Gram Stain   Final    FEW WBC PRESENT,BOTH PMN AND MONONUCLEAR RARE GRAM POSITIVE COCCI IN PAIRS AND CHAINS RARE YEAST    Culture   Final    RARE GRAM NEGATIVE RODS FEW YEAST IDENTIFICATION AND SUSCEPTIBILITIES TO FOLLOW Performed at [facility], [address], [place], [place] [address]    Report Status PENDING  Incomplete    Labs: Results for orders  placed or performed during the hospital encounter of 04/10/24 (from the past 48 hours)  Glucose, capillary     Status: Abnormal   Collection Time: 04/22/24  7:55 AM  Result Value Ref Range   Glucose-Capillary 245 (H) 70 - 99 mg/dL    Comment: Glucose reference range applies only to samples taken after fasting for at least 8 hours.  Glucose, capillary     Status: Abnormal   Collection Time: 04/22/24 11:27 AM  Result Value Ref Range   Glucose-Capillary 219 (H) 70 - 99 mg/dL    Comment: Glucose reference range applies only to samples taken after fasting for at least 8 hours.   Comment 1 Notify RN   Renal function panel (daily at 1600)     Status: Abnormal   Collection Time: 04/22/24  4:04 PM  Result Value Ref Range   Sodium 134 (L) 135 - 145 mmol/L   Potassium 4.2 3.5 - 5.1 mmol/L   Chloride 100 98 - 111 mmol/L   CO2 20 (L) 22 - 32 mmol/L   Glucose, Bld 279 (H) 70 - 99 mg/dL    Comment: Glucose reference range applies only to samples taken after fasting for at least 8 hours.   BUN 79 (  H) 8 - 23 mg/dL   Creatinine, Ser 3.08 (H) 0.61 - 1.24 mg/dL   Calcium  6.5 (L) 8.9 - 10.3 mg/dL   Phosphorus 7.9 (H) 2.5 - 4.6 mg/dL   Albumin  1.8 (L) 3.5 - 5.0 g/dL   GFR, Estimated 17 (L) >60 mL/min    Comment: (NOTE) Calculated using the CKD-EPI Creatinine Equation (2021)    Anion gap 14 5 - 15    Comment: Performed at [facility], [address] [place], [place] [address]  Glucose, capillary     Status: Abnormal   Collection Time: 04/22/24  4:12 PM  Result Value Ref Range   Glucose-Capillary 242 (H) 70 - 99 mg/dL    Comment: Glucose reference range applies only to samples taken after fasting for at least 8 hours.  Glucose, capillary     Status: Abnormal   Collection Time: 04/22/24  8:03 PM  Result Value Ref Range   Glucose-Capillary 230 (H) 70 - 99 mg/dL    Comment: Glucose reference range applies only to samples taken after fasting for at least 8 hours.  Glucose,  capillary     Status: Abnormal   Collection Time: 04/22/24 11:49 PM  Result Value Ref Range   Glucose-Capillary 231 (H) 70 - 99 mg/dL    Comment: Glucose reference range applies only to samples taken after fasting for at least 8 hours.  Comprehensive metabolic panel     Status: Abnormal   Collection Time: 04/23/24  4:16 AM  Result Value Ref Range   Sodium 135 135 - 145 mmol/L   Potassium 4.4 3.5 - 5.1 mmol/L   Chloride 101 98 - 111 mmol/L   CO2 24 22 - 32 mmol/L   Glucose, Bld 231 (H) 70 - 99 mg/dL    Comment: Glucose reference range applies only to samples taken after fasting for at least 8 hours.   BUN 68 (H) 8 - 23 mg/dL   Creatinine, Ser 6.96 (H) 0.61 - 1.24 mg/dL   Calcium  6.8 (L) 8.9 - 10.3 mg/dL   Total Protein 4.9 (L) 6.5 - 8.1 g/dL   Albumin  1.7 (L) 3.5 - 5.0 g/dL   AST 21 15 - 41 U/L   ALT 13 0 - 44 U/L   Alkaline Phosphatase 46 38 - 126 U/L   Total Bilirubin 0.7 0.0 - 1.2 mg/dL   GFR, Estimated 22 (L) >60 mL/min    Comment: (NOTE) Calculated using the CKD-EPI Creatinine Equation (2021)    Anion gap 10 5 - 15    Comment: Performed at [facility], [address], [place], [place] [address]  Magnesium      Status: None   Collection Time: 04/23/24  4:16 AM  Result Value Ref Range   Magnesium  2.4 1.7 - 2.4 mg/dL    Comment: Performed at [facility], [address] [place], [place] [address]  Phosphorus     Status: Abnormal   Collection Time: 04/23/24  4:16 AM  Result Value Ref Range   Phosphorus 5.2 (H) 2.5 - 4.6 mg/dL    Comment: Performed at [facility], [address] [place], [place] [address]  CBC     Status: Abnormal   Collection Time: 04/23/24  4:16 AM  Result Value Ref Range   WBC 17.0 (H) 4.0 - 10.5 K/uL   RBC 2.76 (L) 4.22 - 5.81 MIL/uL   Hemoglobin 8.0 (L) 13.0 - 17.0 g/dL   HCT 72.5 (L) 36.6 - 44.0 %  MCV 89.5 80.0 - 100.0 fL   MCH 29.0 26.0 - 34.0 pg   MCHC 32.4 30.0 - 36.0 g/dL   RDW  09.8 11.9 - 14.7 %   Platelets 237 150 - 400 K/uL   nRBC 0.1 0.0 - 0.2 %    Comment: Performed at [facility], [address], [place], [place] [address]  Renal function panel (daily at 0500)     Status: Abnormal   Collection Time: 04/23/24  4:16 AM  Result Value Ref Range   Sodium 134 (L) 135 - 145 mmol/L   Potassium 4.3 3.5 - 5.1 mmol/L   Chloride 99 98 - 111 mmol/L   CO2 23 22 - 32 mmol/L   Glucose, Bld 235 (H) 70 - 99 mg/dL    Comment: Glucose reference range applies only to samples taken after fasting for at least 8 hours.   BUN 68 (H) 8 - 23 mg/dL   Creatinine, Ser 2.13 (H) 0.61 - 1.24 mg/dL   Calcium  6.7 (L) 8.9 - 10.3 mg/dL   Phosphorus 5.4 (H) 2.5 - 4.6 mg/dL   Albumin  1.8 (L) 3.5 - 5.0 g/dL   GFR, Estimated 23 (L) >60 mL/min    Comment: (NOTE) Calculated using the CKD-EPI Creatinine Equation (2021)    Anion gap 12 5 - 15    Comment: Performed at [facility], [address], [place], [place] [address]  APTT     Status: Abnormal   Collection Time: 04/23/24  4:16 AM  Result Value Ref Range   aPTT 37 (H) 24 - 36 seconds    Comment:        IF BASELINE aPTT IS ELEVATED, SUGGEST PATIENT RISK ASSESSMENT BE USED TO DETERMINE APPROPRIATE ANTICOAGULANT THERAPY. Performed at [facility], [address], [place], [place] [address]   Glucose, capillary     Status: Abnormal   Collection Time: 04/23/24  4:42 AM  Result Value Ref Range   Glucose-Capillary 223 (H) 70 - 99 mg/dL    Comment: Glucose reference range applies only to samples taken after fasting for at least 8 hours.  Glucose, capillary     Status: Abnormal   Collection Time: 04/23/24  8:10 AM  Result Value Ref Range   Glucose-Capillary 232 (H) 70 - 99 mg/dL    Comment: Glucose reference range applies only to samples taken after fasting for at least 8 hours.  Creatinine, fluid (pleural, peritoneal, JP Drainage)     Status: None   Collection Time: 04/23/24   8:22 AM  Result Value Ref Range   Creat, Fluid 2.9 mg/dL    Comment: (NOTE) No normal range established for this test Results should be evaluated in conjunction with serum values    Fluid Type-FCRE JP DRAINAGE     Comment: Performed at [facility], [address] [place], [place] [address]  Glucose, capillary     Status: Abnormal   Collection Time: 04/23/24 12:03 PM  Result Value Ref Range   Glucose-Capillary 212 (H) 70 - 99 mg/dL    Comment: Glucose reference range applies only to samples taken after fasting for at least 8 hours.  Glucose, capillary     Status: Abnormal   Collection Time: 04/23/24  3:31 PM  Result Value Ref Range   Glucose-Capillary 194 (H) 70 - 99 mg/dL    Comment: Glucose reference range applies only to samples taken after fasting for at least 8 hours.  Renal function panel (daily at 1600)  Status: Abnormal   Collection Time: 04/23/24  4:00 PM  Result Value Ref Range   Sodium 137 135 - 145 mmol/L   Potassium 4.0 3.5 - 5.1 mmol/L   Chloride 100 98 - 111 mmol/L   CO2 24 22 - 32 mmol/L   Glucose, Bld 205 (H) 70 - 99 mg/dL    Comment: Glucose reference range applies only to samples taken after fasting for at least 8 hours.   BUN 67 (H) 8 - 23 mg/dL   Creatinine, Ser 1.61 (H) 0.61 - 1.24 mg/dL   Calcium  6.9 (L) 8.9 - 10.3 mg/dL   Phosphorus 4.2 2.5 - 4.6 mg/dL   Albumin  1.9 (L) 3.5 - 5.0 g/dL   GFR, Estimated 24 (L) >60 mL/min    Comment: (NOTE) Calculated using the CKD-EPI Creatinine Equation (2021)    Anion gap 13 5 - 15    Comment: Performed at [facility], [address] [place], [place] [address]  Heparin  level (unfractionated)     Status: None   Collection Time: 04/23/24  7:37 PM  Result Value Ref Range   Heparin  Unfractionated 0.56 0.30 - 0.70 IU/mL    Comment: (NOTE) The clinical reportable range upper limit is being lowered to >1.10 to align with the FDA approved guidance for the current  laboratory assay.  If heparin  results are below expected values, and patient dosage has  been confirmed, suggest follow up testing of antithrombin III levels. Performed at [facility], [address] [place], [place] [address]   Glucose, capillary     Status: Abnormal   Collection Time: 04/23/24  7:48 PM  Result Value Ref Range   Glucose-Capillary 168 (H) 70 - 99 mg/dL    Comment: Glucose reference range applies only to samples taken after fasting for at least 8 hours.  Glucose, capillary     Status: Abnormal   Collection Time: 04/24/24 12:03 AM  Result Value Ref Range   Glucose-Capillary 164 (H) 70 - 99 mg/dL    Comment: Glucose reference range applies only to samples taken after fasting for at least 8 hours.  Triglycerides     Status: Abnormal   Collection Time: 04/24/24  3:38 AM  Result Value Ref Range   Triglycerides 195 (H) <150 mg/dL    Comment: Performed at [facility], [address], [place], [place] [address]  Renal function panel (daily at 0500)     Status: Abnormal   Collection Time: 04/24/24  3:38 AM  Result Value Ref Range   Sodium 133 (L) 135 - 145 mmol/L   Potassium 3.6 3.5 - 5.1 mmol/L   Chloride 97 (L) 98 - 111 mmol/L   CO2 25 22 - 32 mmol/L   Glucose, Bld 154 (H) 70 - 99 mg/dL    Comment: Glucose reference range applies only to samples taken after fasting for at least 8 hours.   BUN 59 (H) 8 - 23 mg/dL   Creatinine, Ser 7.82 (H) 0.61 - 1.24 mg/dL   Calcium  6.8 (L) 8.9 - 10.3 mg/dL   Phosphorus 3.2 2.5 - 4.6 mg/dL   Albumin  2.1 (L) 3.5 - 5.0 g/dL   GFR, Estimated 38 (L) >60 mL/min    Comment: (NOTE) Calculated using the CKD-EPI Creatinine Equation (2021)    Anion gap 11 5 - 15    Comment: Performed at [facility], [address], [place], [place] [address]  Magnesium      Status: None   Collection  Time: 04/24/24  3:38 AM  Result Value Ref Range   Magnesium  2.4 1.7 - 2.4 mg/dL    Comment:  Performed at [facility], [address] [place], [place] [address]  APTT     Status: Abnormal   Collection Time: 04/24/24  3:38 AM  Result Value Ref Range   aPTT 189 (HH) 24 - 36 seconds    Comment:        IF BASELINE aPTT IS ELEVATED, SUGGEST PATIENT RISK ASSESSMENT BE USED TO DETERMINE APPROPRIATE ANTICOAGULANT THERAPY. REPEATED TO VERIFY CRITICAL RESULT CALLED TO, READ BACK BY AND VERIFIED WITH: Maxie Spaniel RN @ [phone] ON 04/24/2024 BY MTA Performed at [facility], [address], [place], [place] [address]   CBC     Status: Abnormal   Collection Time: 04/24/24  3:38 AM  Result Value Ref Range   WBC 13.0 (H) 4.0 - 10.5 K/uL   RBC 2.67 (L) 4.22 - 5.81 MIL/uL   Hemoglobin 7.5 (L) 13.0 - 17.0 g/dL   HCT 02.7 (L) 25.3 - 66.4 %   MCV 87.3 80.0 - 100.0 fL   MCH 28.1 26.0 - 34.0 pg   MCHC 32.2 30.0 - 36.0 g/dL   RDW 40.3 47.4 - 25.9 %   Platelets 234 150 - 400 K/uL   nRBC 0.2 0.0 - 0.2 %    Comment: Performed at [facility], [address] [place], [place] [address]  Glucose, capillary     Status: Abnormal   Collection Time: 04/24/24  3:42 AM  Result Value Ref Range   Glucose-Capillary 149 (H) 70 - 99 mg/dL    Comment: Glucose reference range applies only to samples taken after fasting for at least 8 hours.    Imaging / Studies: DG Chest Port 1 View Result Date: 04/22/2024 CLINICAL DATA:  Central line placement. EXAM: PORTABLE CHEST 1 VIEW COMPARISON:  04/20/2024. FINDINGS: Left IJ hemodialysis CVC catheter tip overlies the lower SVC. Right PICC tip overlies the superior cavoatrial junction. Endotracheal tube tip is located proximally 4 cm above the carina. Enteric tube tip and side port within the stomach. Low lung volumes with unchanged cardiomediastinal silhouette. No pneumothorax. Left basilar opacity may reflect atelectasis or infiltrate. Mild right basilar atelectasis. IMPRESSION: 1. Left IJ hemodialysis CVC catheter  tip overlies the lower SVC. 2. Additional life-support apparatus, as above. 3. Low lung volumes with left basilar opacity, which may reflect atelectasis or infiltrate. Mild right basilar atelectasis. Electronically Signed   By: Mannie Seek M.D.   On: 04/22/2024 12:25     Medications / Allergies: per chart  Antibiotics: Anti-infectives (From admission, onward)    Start     Dose/Rate Route Frequency Ordered Stop   04/21/24 1400  clindamycin  (CLEOCIN ) 900 mg, gentamicin  (GARAMYCIN ) 240 mg in sodium chloride  0.9 % 1,000 mL for intraperitoneal lavage  Status:  Discontinued         Irrigation To Surgery 04/21/24 1346 04/21/24 1618   04/20/24 1400  piperacillin -tazobactam (ZOSYN ) IVPB 3.375 g        3.375 g 12.5 mL/hr over 240 Minutes Intravenous Every 8 hours 04/20/24 0755     04/20/24 1000  metroNIDAZOLE  (FLAGYL ) IVPB 500 mg  Status:  Discontinued        500 mg 100 mL/hr over 60 Minutes Intravenous Every 12 hours 04/20/24 0320 04/20/24 0752   04/20/24 1000  micafungin  (MYCAMINE ) 150 mg in sodium chloride  0.9 % 100 mL IVPB  150 mg 107.5 mL/hr over 1 Hours Intravenous Every 24 hours 04/20/24 0752     04/20/24 0245  ceFEPIme  (MAXIPIME ) 2 g in sodium chloride  0.9 % 100 mL IVPB  Status:  Discontinued        2 g 200 mL/hr over 30 Minutes Intravenous Every 8 hours 04/20/24 0232 04/20/24 0752   04/20/24 0100  metroNIDAZOLE  (FLAGYL ) IVPB 500 mg        500 mg 100 mL/hr over 60 Minutes Intravenous On call to O.R. 04/20/24 0004 04/20/24 0100   04/14/24 1800  ceFEPIme  (MAXIPIME ) 2 g in sodium chloride  0.9 % 100 mL IVPB        2 g 200 mL/hr over 30 Minutes Intravenous Every 8 hours 04/14/24 1003 04/19/24 0957   04/14/24 1600  vancomycin  (VANCOCIN ) IVPB 1000 mg/200 mL premix  Status:  Discontinued        1,000 mg 200 mL/hr over 60 Minutes Intravenous Every 24 hours 04/13/24 1420 04/14/24 1000   04/14/24 1600  vancomycin  (VANCOREADY) IVPB 1500 mg/300 mL  Status:  Discontinued        1,500  mg 150 mL/hr over 120 Minutes Intravenous Every 24 hours 04/14/24 1002 04/16/24 0744   04/14/24 1200  vancomycin  (VANCOCIN ) IVPB 1000 mg/200 mL premix  Status:  Discontinued        1,000 mg 200 mL/hr over 60 Minutes Intravenous Every 24 hours 04/13/24 1053 04/13/24 1109   04/13/24 2200  ceFEPIme  (MAXIPIME ) 2 g in sodium chloride  0.9 % 100 mL IVPB  Status:  Discontinued        2 g 200 mL/hr over 30 Minutes Intravenous Every 12 hours 04/13/24 1036 04/13/24 1109   04/13/24 2200  ceFEPIme  (MAXIPIME ) 2 g in sodium chloride  0.9 % 100 mL IVPB  Status:  Discontinued        2 g 200 mL/hr over 30 Minutes Intravenous Every 12 hours 04/13/24 1425 04/14/24 1003   04/13/24 1500  vancomycin  (VANCOCIN ) 2,000 mg in sodium chloride  0.9 % 500 mL IVPB        2,000 mg 260 mL/hr over 120 Minutes Intravenous  Once 04/13/24 1409 04/13/24 1850   04/13/24 1500  ceFEPIme  (MAXIPIME ) 2 g in sodium chloride  0.9 % 100 mL IVPB  Status:  Discontinued        2 g 200 mL/hr over 30 Minutes Intravenous Every 12 hours 04/13/24 1420 04/13/24 1425   04/13/24 1500  metroNIDAZOLE  (FLAGYL ) IVPB 500 mg  Status:  Discontinued        500 mg 100 mL/hr over 60 Minutes Intravenous Every 12 hours 04/13/24 1420 04/17/24 0725   04/13/24 1200  vancomycin  (VANCOCIN ) 2,000 mg in sodium chloride  0.9 % 500 mL IVPB  Status:  Discontinued        2,000 mg 260 mL/hr over 120 Minutes Intravenous  Once 04/13/24 1052 04/13/24 1121   04/13/24 1100  metroNIDAZOLE  (FLAGYL ) IVPB 500 mg  Status:  Discontinued        500 mg 100 mL/hr over 60 Minutes Intravenous 2 times daily 04/13/24 1007 04/13/24 1109   04/13/24 1100  vancomycin  (VANCOCIN ) IVPB 1000 mg/200 mL premix  Status:  Discontinued        1,000 mg 200 mL/hr over 60 Minutes Intravenous  Once 04/13/24 1007 04/13/24 1020   04/13/24 1015  ceFEPIme  (MAXIPIME ) 2 g in sodium chloride  0.9 % 100 mL IVPB        2 g 200 mL/hr over 30 Minutes Intravenous STAT 04/13/24 1007 04/14/24 1721  04/10/24 2200   ceFAZolin  (ANCEF ) IVPB 2g/100 mL premix        2 g 200 mL/hr over 30 Minutes Intravenous Every 8 hours 04/10/24 1833 04/11/24 0531   04/10/24 0600  ceFAZolin  (ANCEF ) IVPB 2g/100 mL premix        2 g 200 mL/hr over 30 Minutes Intravenous On call to O.R. 04/10/24 0533 04/10/24 1526         Note: Portions of this report may have been transcribed using voice recognition software. Every effort was made to ensure accuracy; however, inadvertent computerized transcription errors may be present.   Any transcriptional errors that result from this process are unintentional.    Eddye Goodie, MD, FACS, MASCRS Esophageal, Gastrointestinal & Colorectal Surgery Robotic and Minimally Invasive Surgery  [facility] [address], [address] [place], [place] [address] [phone] Fax [phone] Main  CONTACT INFORMATION: Weekday (9AM-5PM): Call [facility] main office at [phone] Weeknight (5PM-9AM) or Weekend/Holiday: Check EPIC Web Links tab & use AMION (password  [email or web address]) for General Surgery [facility] coverage  Please, DO NOT use SecureChat  (it is not reliable communication to reach operating surgeons & will lead to a delay in care).   Epic staff messaging available for outptient concerns needing 1-2 business day response.      04/24/2024  7:16 AM

## 2024-04-25 DIAGNOSIS — A419 Sepsis, unspecified organism: Secondary | ICD-10-CM | POA: Diagnosis not present

## 2024-04-25 DIAGNOSIS — R6521 Severe sepsis with septic shock: Secondary | ICD-10-CM | POA: Diagnosis not present

## 2024-04-25 DIAGNOSIS — K430 Incisional hernia with obstruction, without gangrene: Secondary | ICD-10-CM | POA: Diagnosis not present

## 2024-04-25 DIAGNOSIS — N179 Acute kidney failure, unspecified: Secondary | ICD-10-CM | POA: Diagnosis not present

## 2024-04-25 LAB — BASIC METABOLIC PANEL WITH GFR
Anion gap: 9 (ref 5–15)
BUN: 53 mg/dL — ABNORMAL HIGH (ref 8–23)
CO2: 23 mmol/L (ref 22–32)
Calcium: 7 mg/dL — ABNORMAL LOW (ref 8.9–10.3)
Chloride: 104 mmol/L (ref 98–111)
Creatinine, Ser: 1.75 mg/dL — ABNORMAL HIGH (ref 0.61–1.24)
GFR, Estimated: 41 mL/min — ABNORMAL LOW (ref >60)
Glucose, Bld: 141 mg/dL — ABNORMAL HIGH (ref 70–99)
Potassium: 3.6 mmol/L (ref 3.5–5.1)
Sodium: 136 mmol/L (ref 135–145)

## 2024-04-25 LAB — AEROBIC/ANAEROBIC CULTURE W GRAM STAIN (SURGICAL/DEEP WOUND)

## 2024-04-25 LAB — CULTURE, BLOOD (ROUTINE X 2)
Culture: NO GROWTH
Culture: NO GROWTH

## 2024-04-25 LAB — CBC
HCT: 23.5 % — ABNORMAL LOW (ref 39.0–52.0)
Hemoglobin: 7.5 g/dL — ABNORMAL LOW (ref 13.0–17.0)
MCH: 29.2 pg (ref 26.0–34.0)
MCHC: 31.9 g/dL (ref 30.0–36.0)
MCV: 91.4 fL (ref 80.0–100.0)
Platelets: 233 10*3/uL (ref 150–400)
RBC: 2.57 MIL/uL — ABNORMAL LOW (ref 4.22–5.81)
RDW: 15.4 % (ref 11.5–15.5)
WBC: 11.4 10*3/uL — ABNORMAL HIGH (ref 4.0–10.5)
nRBC: 0.3 % — ABNORMAL HIGH (ref 0.0–0.2)

## 2024-04-25 LAB — MAGNESIUM: Magnesium: 2.8 mg/dL — ABNORMAL HIGH (ref 1.7–2.4)

## 2024-04-25 LAB — RENAL FUNCTION PANEL
Albumin: 1.9 g/dL — ABNORMAL LOW (ref 3.5–5.0)
Anion gap: 9 (ref 5–15)
BUN: 54 mg/dL — ABNORMAL HIGH (ref 8–23)
CO2: 22 mmol/L (ref 22–32)
Calcium: 6.9 mg/dL — ABNORMAL LOW (ref 8.9–10.3)
Chloride: 104 mmol/L (ref 98–111)
Creatinine, Ser: 1.77 mg/dL — ABNORMAL HIGH (ref 0.61–1.24)
GFR, Estimated: 41 mL/min — ABNORMAL LOW (ref >60)
Glucose, Bld: 142 mg/dL — ABNORMAL HIGH (ref 70–99)
Phosphorus: 2.7 mg/dL (ref 2.5–4.6)
Potassium: 3.6 mmol/L (ref 3.5–5.1)
Sodium: 135 mmol/L (ref 135–145)

## 2024-04-25 LAB — HEPARIN LEVEL (UNFRACTIONATED): Heparin Unfractionated: 0.67 [IU]/mL (ref 0.30–0.70)

## 2024-04-25 LAB — GLUCOSE, CAPILLARY
Glucose-Capillary: 154 mg/dL — ABNORMAL HIGH (ref 70–99)
Glucose-Capillary: 159 mg/dL — ABNORMAL HIGH (ref 70–99)
Glucose-Capillary: 178 mg/dL — ABNORMAL HIGH (ref 70–99)
Glucose-Capillary: 147 mg/dL — ABNORMAL HIGH (ref 70–99)
Glucose-Capillary: 148 mg/dL — ABNORMAL HIGH (ref 70–99)

## 2024-04-25 MED ORDER — PROSOURCE TF20 ENFIT COMPATIBL EN LIQD
60.0000 mL | Freq: Every day | ENTERAL | Status: DC
  Administered 2024-04-25: 60 mL
  Filled 2024-04-25: qty 60

## 2024-04-25 MED ORDER — HYDRALAZINE HCL 20 MG/ML IJ SOLN
20.0000 mg | INTRAMUSCULAR | Status: DC | PRN
  Administered 2024-04-25 – 2024-04-27 (×5): 20 mg via INTRAVENOUS
  Filled 2024-04-25 (×5): qty 1

## 2024-04-25 MED ORDER — ORAL CARE MOUTH RINSE
15.0000 mL | OROMUCOSAL | Status: DC
  Administered 2024-04-25 – 2024-05-22 (×91): 15 mL via OROMUCOSAL

## 2024-04-25 MED ORDER — SODIUM CHLORIDE 0.9 % IV SOLN: INTRAVENOUS | Status: AC | PRN

## 2024-04-25 MED ORDER — TRAVASOL 10 % IV SOLN
INTRAVENOUS | Status: AC
  Filled 2024-04-25: qty 672

## 2024-04-25 MED ORDER — METOPROLOL TARTRATE 25 MG PO TABS
25.0000 mg | ORAL_TABLET | Freq: Two times a day (BID) | ORAL | Status: DC
  Administered 2024-04-25 – 2024-04-28 (×6): 25 mg
  Filled 2024-04-25 (×6): qty 1

## 2024-04-25 NOTE — Progress Notes (Signed)
 PHARMACY - ANTICOAGULATION CONSULT NOTE  Pharmacy Consult for Heparin  Indication: h/o PE  Allergies  Allergen Reactions   Demerol  [Meperidine ] Nausea And Vomiting and Other (See Comments)    SEVERE NAUSEA    Patient Measurements: Height: 5' 11 (180.3 cm) Weight: 117.1 kg (258 lb 2.5 oz) IBW/kg (Calculated) : 75.3 HEPARIN  DW (KG): 100.8  Vital Signs: Temp: 97.9 F (36.6 C) (06/14 0600) Temp Source: Esophageal (06/14 0600) BP: 119/60 (06/14 0600) Pulse Rate: 93 (06/14 0600)  Labs: Recent Labs    04/23/24 0416 04/23/24 1600 04/23/24 1937 04/24/24 0338 04/24/24 0601 04/24/24 0745 04/24/24 1600 04/25/24 0508 04/25/24 0618  HGB 8.0*  --   --  7.5*  --   --   --  7.5*  --   HCT 24.7*  --   --  23.3*  --   --   --  23.5*  --   PLT 237  --   --  234  --   --   --  233  --   APTT 37*  --   --  189* 167*  --   --   --   --   HEPARINUNFRC  --   --  0.56  --   --  0.60  --  0.67  --   CREATININE 2.85*  2.99*   < >  --  1.85*  --   --  2.07*  --  1.77*  1.75*   < > = values in this interval not displayed.    Estimated Creatinine Clearance: 50.4 mL/min (A) (by C-G formula based on SCr of 1.75 mg/dL (H)).   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   moderate atypia - right low back   Atypical nevus 04/04/2007   moderate to marked - right upper back (wider shave)   Atypical nevus 04/04/2007   moderate atypia - center chest (wider shave)   Atypical nevus 04/04/2007   slight atypia - right thigh   Atypical nevus 11/29/2011   mild atypia - center upper back   Atypical nevus 11/29/2011   mild atypia - center chest   Bacteremia due to Klebsiella pneumoniae 10/09/2017   Bladder cancer (HCC) dx 07/2017   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 denies   Sleep apnea    cpap   Squamous cell carcinoma of skin 05/22/2013   left cheek - CX3 + 5FU   Wears glasses     Assessment: AC/Heme: Eliquis PTA (LD 5/27) for h/o PE. >> heparin  drip - R flank hematoma: RN reports stable - Hep level 0.67, Hgb 7.5 still today, Plt WNL  Goal of Therapy:  Heparin  level 0.3-0.7 units/ml Monitor platelets by anticoagulation protocol: Yes  Plan:  - Heparin  CRRT SYRINGE noted OFF 6/14 AM - IV heparin  1700 units/hr - Daily HL and CBC, d/c aPTTs   Reiner Loewen Darcel Early, PharmD, BCPS Clinical Staff Pharmacist Enis Harsh Stillinger 04/25/2024,7:24 AM

## 2024-04-25 NOTE — Progress Notes (Signed)
 Extubated this morning and doing well  Blood pressure elevated  Apresoline  20 mg as needed for systolic greater than 160  Lopressor  25 twice daily per tube

## 2024-04-25 NOTE — Plan of Care (Signed)

## 2024-04-25 NOTE — Progress Notes (Signed)
 PHARMACY - TOTAL PARENTERAL NUTRITION CONSULT NOTE   Indication: Prolonged ileus  Patient Measurements: Height: 5' 11 (180.3 cm) Weight: 117.1 kg (258 lb 2.5 oz) IBW/kg (Calculated) : 75.3 TPN AdjBW (KG): 85.6 Body mass index is 36.01 kg/m.  Assessment: Pt is a 3 yoM who underwent hernia repair and urostomy revision on 04/10/24. Started TPN 6/2 for post-op ileus. Return to OR early 6/9 for perforated bowel s/p drainage of abscesses, small bowel resection (in discontinuity) with placement of wound VAC. He returned to the ICU intubated and requiring vasopressor support. On 6/10, patient returned to OR and is now s/p abdominal wall debridement, ileal resection, end ileostomy, hernia repairs and fascia closure w/ placement of wound vac. He was started on trickle feeds. He remains on TPN pending ability to advance tube feeds.  Glucose / Insulin : Hx DM on metformin PTA. A1c 5.3% -CBGs 140-159 last 24h: 26 units SSI last 24h -Semglee  50 units + 5 units /4hrs scheduled Novolog  (30 units/d) + SSI (26 ut) -Stress dose steroids HC started 6/10 am, now tapering  Electrolytes:  -Mg 2.8 elevated today (CRRT now stopped) -K+ 3.6 -no K+ in TPN since 6/9 (goal >=4 with ileus) - Phos 2.7 dropping -CorrCa 8.58 - remains just below normal,   Renal: 6/14 CRRT OFF; only 665 ml UOP yesterday  Hepatic: albumin  1.9; LFTs WNL;  TG 64 (6/10) now up to 195 (6/13). Was on Propofol  6/9-6/11. Check again Monday.  Intake / Output; MIVF:  -Minimal UOP 365, on CRRT -NG/emesis 30 ml yesterday -Drain > 327 yesterday -Ileostomy OP 535  GI Imaging: -6/9: Air-fluid collection in the subcutaneous soft tissues of the right anterior pelvic wall extending from the midline to the ileostomy -6/3: Right lower quadrant small bowel loop with mural defect and adjacent extraluminal gas identified, which is concerning for underlying small bowel perforation. GI Surgeries / Procedures:  -6/10: Abdominal wall debridement, ileal  resection, end ileostomy, hernia repairs, fascia closure w/ placement of wound vac -6/9: Perforated bowel - drainage of right abdominal wall abscess as well as intra-abdominal abscess, explantation of abdominal mesh, small bowel resection (in discontinuity), placement of wound VAC -5/30: Hernia repair, lysis of adhesions, urostomy revision  Central access: PICC TPN start date: 6/2   Nutritional Goals: - Goal TPN rate is 90 mL/hr (provides 101.5 g of protein and 2047 kcals per day)  - Vital 1.5 at 55 ml/h (1320 ml per day) + Prosource TF20 60 ml QID: Provides 2300 kcal, 169 gm protein, 1008 ml free water  daily    RD Assessment: Estimated Needs Total Energy Estimated Needs: 2100-2300 kcals Total Protein Estimated Needs: 155-180 grams Total Fluid Estimated Needs: >/= 2.1L  Current Nutrition:  TPN : decr to 51ml/hr Tube feeds at 30 ml/hr  Plan: Decrease TPN to rate 50 mL/hr with increase in TF rate. 6/12 Added back lipids given patient is no longer on propofol  Electrolytes in TPN:  Na 75 mEq/L K 0 mEq/L - will leave K out of TPN (goal >=4 with ileus) Ca 8 mEq/L Mg 0 (remove) Phos 5 mmol/L -(anticipation of CRRT continuing to decrease phos) Cl:Ac 1:1 Add standard MVI and trace elements to TPN Insulin : (no change today)  Semglee  50 units daily Continues on resistant SSI q4hr  Novolog  to 5 units q4hr TF coverage to be given in addition to the SSI (hold if tube feeds are held/stopped) Triglyceride recheck on Monday Monitor TPN labs on Mon/Thurs and PRN - Paged Dr. Dorrie Gaudier. Will not stop TPN today. -  Advance TF to goal 24ml/hr this afternoon. Need to add Prosource order per RD 6/11 recommendations please.   Hilliard Loyal, PharmD, BCPS Clinical Pharmacist 04/25/2024 9:52 AM

## 2024-04-25 NOTE — Progress Notes (Signed)
 NAME:  Kristopher Thompson, MRN:  [id number], DOB:  06-03-1951, LOS: 15 ADMISSION DATE:  04/10/2024, CONSULTATION DATE:  04/13/2024 REFERRING MD:  Dr Bonita Bussing, CHIEF COMPLAINT: Sepsis  History of Present Illness:  S/p repair of incisional/parastomal and ventral hernias, left inguinal incarcerated , abdominal hernias, lysis of adhesions, urostomy revision Day 1 postoperatively  Transferred to the ICU secondary to tachycardia, tachypnea  History of bladder cancer, prostate cancer, diastolic heart failure, diabetes, right bundle branch block, multiple abdominal surgeries  Update 04/20/24: ccm reconsulted post operatively after pt had perforated bowel and returned to OR. Pt is requiring neo and norepi at this time. He remains intubated at this time. Profoundly acidotic with bicarb of 12. Will start sodium bicarb infusion and bolus prior to initiation. Follow labs in am. Updated wife and son at bedside.   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   moderate atypia - right low back   Atypical nevus 04/04/2007   moderate to marked - right upper back (wider shave)   Atypical nevus 04/04/2007   moderate atypia - center chest (wider shave)   Atypical nevus 04/04/2007   slight atypia - right thigh   Atypical nevus 11/29/2011   mild atypia - center upper back   Atypical nevus 11/29/2011   mild atypia - center chest   Bacteremia due to Klebsiella pneumoniae 10/09/2017   Bladder cancer (HCC) dx 07/2017   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 denies   Sleep apnea    cpap   Squamous cell carcinoma of skin 05/22/2013   left cheek - CX3 + 5FU   Wears glasses    Significant Hospital Events: Including procedures, antibiotic start and stop dates in addition to other pertinent events   04/10/2024-laparoscopic surgery 04/13/24 CCM consult. Incr NE. Art line  6/3 weaning NE. Gently diuresed w 3L off  6/4 off NE. Pulled out his NGT refused replacement. After multiple emesis  6/5 cont NGT 6/6 tx to med surg 6/9: taken back to OR for perforated bowel, post-op shock 6/10: Norepinephrine , Phenylephrine , and vasopressin , added steroids, 1u PRBCs, back to OR for washout and closure 6/11: Worsening AKI with fluid overload, start CRRT, decreasing pressor requirements 6/13 tolerating CRRT 6/14: Off CRRT, arousable.  Tolerated weaning for most of the day 6/13  Interim History / Subjective:   Weaned all day 6/13 Off pressors Off CRRT Will follow one-step some commands  Objective    Blood pressure 119/60, pulse 93, temperature 97.9 F (36.6 C), temperature source Esophageal, resp. rate (!) 22, height 5' 11 (1.803 m), weight 117.1 kg, SpO2 100%. CVP:  [0 mmHg-14 mmHg] 11 mmHg  Vent Mode: PRVC FiO2 (%):  [28 %-30 %] 30 %  Set Rate:  [25 bmp] 25 bmp Vt Set:  [600 mL] 600 mL PEEP:  [5 cmH20-8 cmH20] 8 cmH20 Pressure Support:  [10 cmH20-12 cmH20] 10 cmH20 Plateau Pressure:  [15 cmH20-18 cmH20] 18 cmH20   Intake/Output Summary (Last 24 hours) at 04/25/2024 0726 Last data filed at 04/25/2024 0600 Gross per 24 hour  Intake 4276.84 ml  Output 8298.3 ml  Net -4021.46 ml   Filed Weights   04/22/24 1309 04/24/24 0547 04/25/24 0442  Weight: 129 kg 116.4 kg 117.1 kg    Examination: General: Chronically ill-appearing, on  vent Neuro: Able to follow one-step commands, very frail HEENT: Endotracheal tube in place RESP: Clear breath sounds to auscultation anteriorly CV: S1-S2 appreciated, peripheral edema GI: Bowel sounds present, JP drains in place  GU: urostomy with no urine present Skin: warm and dry, abdominal wound covered with wound vac  I reviewed nursing notes, Consultant notes, last 24 h vitals and pain scores, last 48 h intake and output, last 24 h labs and trends, and last 24 h imaging results.  On micafungin -day 6 On Zosyn -day 6 Last blood culture 6/9-no growth  Resolved problem list    Assessment and Plan   Septic shock due to acute bowel perforation, was in OR 6/9, back to the OR 6/10 for closure and ileal resection and end ileostomy creation Concern for possible ileal conduit leak-less likely -On antibiotics, antifungals -Tapering stress dose steroids -Off pressors -Blood culture with no growth - Abdominal culture showing gram-positive cocci in pairs rare yeast, gram-negative rods.  Rare E. coli, few Enterococcus faecalis, few Candida albicans  Acute kidney injury Metabolic acidosis - Off CRRT this morning - Off bicarb drip - Appreciate nephrology following -Will likely need hemodialysis.  Acute hypoxemic and hypercarbic respiratory failure Background history of OSA on CPAP therapy - Will continue mechanical ventilator support - He has weaned for the last couple of days - Will attempt extubation today if he is able to wean - Will wean him off Precedex  even though it at low-dose  Hyperglycemia, history of diabetes - Continue SSI  Anemia Right flank hematoma History of PE was on Eliquis at home - Will continue heparin   History of congestive heart failure - Continue to trend CVP  Hyperglycemia, history of diabetes - Continue SSI  Anemia Right flank hematoma History of PE-on Eliquis at home -Continue heparin   History of congestive heart failure - Continue to trend  CVP - He is about 9 L positive in total  Moderate protein calorie malnutrition - Tube feeds will be advanced   Best Practice (right click and Reselect all SmartList Selections daily)   Diet/type: tubefeeds on tpn DVT prophylaxis systemic heparin   Pressure ulcer(s): N/A GI prophylaxis: H2B Lines: Central line, PICC art line  Foley:  N/A urostomy  Code Status:  full code Last date of multidisciplinary goals of care discussion [spouse updated daily]  The patient is critically ill with multiple organ systems failure and requires high complexity decision making for assessment and support, frequent evaluation and titration of therapies, application of advanced monitoring technologies and extensive interpretation of multiple databases. Critical Care Time devoted to patient care services described in this note independent of APP/resident time (if applicable)  is 42 minutes.   Myer Artis MD [facility] Personal pager: See Amion If unanswered, please page CCM On-call: [phone]

## 2024-04-25 NOTE — Progress Notes (Signed)
 Patient has been off Precedex  about 40 minutes More aware of his surroundings Able to squeeze my fingers in both hands  Discussed with family at bedside  We are going to give him a chance off the ventilator  He has weaned well over the last couple of days  If he fails extubation, will likely require tracheostomy

## 2024-04-25 NOTE — Progress Notes (Signed)
 [facility] NEPHROLOGY PROGRESS NOTE  Assessment/ Plan: Pt is a 73 y.o. yo male  with past medical history significant for hypertension, DM, CAD, bladder cancer, CHF, right bundle branch block who had acute bowel perforation and status post OR for bowel repair on 6/8, lysis of adhesion, ileal resection end ileostomy hernia repairs, septic shock seen as a consultation for the evaluation and management of AKI and oliguria.   # Acute kidney injury, oliguric with anasarca likely ischemic ATN due to septic shock and obstructive uropathy given bilateral hydronephrosis.  CRRT 6/11-6/14 now off monitoring for recovery.  UOP picking up - will follow labs/volume and made decision re: ongoing RRT in the coming days.  Should he continue to require RRT he will need to transfer to [facility].  D/w PCCM and family can make that decision tomorrow.   # Bilateral hydronephrosis severe on the left side: S/p revision of ileal conduit 5/30,  urology following.    # Acute bowel perforation is status post bowel repair ileal conduit and hernia repairs urostomy ileal conduit revision.  As per surgical team.    # Septic shock: BPs improved and now off pressors.    # Acute hypoxic respiratory failure/VDRF per pulmonary team. Extubated 6/14   Will follow.   Subjective: Seen and examined in ICU.  CRRT off this AM as planned.  He's extubated now.  Wife and 1 of 4 sons bedside.  UOP up to 665 yesterday.    Objective Vital signs in last 24 hours: Vitals:   04/25/24 0800 04/25/24 1000 04/25/24 1138 04/25/24 1200  BP: (!) 146/69   (!) 163/62  Pulse: 96 97 (!) 126 (!) 108  Resp: (!) 25 (!) 22 16 (!) 24  Temp: 97.9 F (36.6 C) 97.9 F (36.6 C) 97.7 F (36.5 C)   TempSrc: Esophageal     SpO2: 98% 99% 100% 100%  Weight:      Height:       Weight change: 0.7 kg  Intake/Output Summary (Last 24 hours) at 04/25/2024 1326 Last data filed at 04/25/2024 1131 Gross per 24 hour  Intake 3600.43 ml  Output 5471.3 ml   Net -1870.87 ml       Labs: RENAL PANEL Recent Labs  Lab 04/20/24 0254 04/20/24 0846 04/22/24 0341 04/22/24 1604 04/23/24 0416 04/23/24 1600 04/24/24 0338 04/24/24 1600 04/25/24 0508 04/25/24 0618  NA 140   < > 136   < > 134*  135 137 133* 136  --  135  136  K 4.8   < > 5.1   < > 4.3  4.4 4.0 3.6 3.8  --  3.6  3.6  CL 115*   < > 103   < > 99  101 100 97* 101  --  104  104  CO2 13*   < > 21*   < > 23  24 24 25 24   --  22  23  GLUCOSE 154*   < > 287*   < > 235*  231* 205* 154* 143*  --  142*  141*  BUN 35*   < > 80*   < > 68*  68* 67* 59* 56*  --  54*  53*  CREATININE 0.84   < > 3.70*   < > 2.85*  2.99* 2.71* 1.85* 2.07*  --  1.77*  1.75*  CALCIUM  7.0*   < > 6.2*   < > 6.7*  6.8* 6.9* 6.8* 7.2*  --  6.9*  7.0*  MG  1.3*  --  2.3  --  2.4  --  2.4  --  2.8*  --   PHOS 3.0  --  8.5*   < > 5.4*  5.2* 4.2 3.2 3.3  --  2.7  ALBUMIN  <1.5*   < >  --    < > 1.8*  1.7* 1.9* 2.1* 2.1*  --  1.9*   < > = values in this interval not displayed.    Liver Function Tests: Recent Labs  Lab 04/20/24 0254 04/21/24 1715 04/22/24 1604 04/23/24 0416 04/23/24 1600 04/24/24 0338 04/24/24 1600 04/25/24 0618  AST 18 19  --  21  --   --   --   --   ALT 14 12  --  13  --   --   --   --   ALKPHOS 29* 48  --  46  --   --   --   --   BILITOT 0.9 0.7  --  0.7  --   --   --   --   PROT 3.4* 4.7*  --  4.9*  --   --   --   --   ALBUMIN  <1.5* 2.1*   < > 1.8*  1.7*   < > 2.1* 2.1* 1.9*   < > = values in this interval not displayed.   No results for input(s): LIPASE, AMYLASE in the last 168 hours. No results for input(s): AMMONIA in the last 168 hours. CBC: Recent Labs    04/21/24 2347 04/22/24 0341 04/23/24 0416 04/24/24 0338 04/25/24 0508  HGB 8.8* 8.8* 8.0* 7.5* 7.5*  MCV  --  89.8 89.5 87.3 91.4    Cardiac Enzymes: No results for input(s): CKTOTAL, CKMB, CKMBINDEX, TROPONINI in the last 168 hours. CBG: Recent Labs  Lab 04/24/24 1937 04/24/24 2343  04/25/24 0351 04/25/24 0824 04/25/24 1133  GLUCAP 140* 153* 154* 159* 178*    Iron Studies: No results for input(s): IRON, TIBC, TRANSFERRIN, FERRITIN in the last 72 hours. Studies/Results: No results found.   Medications: Infusions:  sodium chloride  10 mL/hr at 04/25/24 1131   sodium chloride  10 mL/hr at 04/25/24 1131   dexmedetomidine  (PRECEDEX ) IV infusion Stopped (04/25/24 1017)   fentaNYL  infusion INTRAVENOUS Stopped (04/23/24 1035)   heparin  10,000 units/ 20 mL infusion syringe Stopped (04/25/24 0645)   heparin  1,700 Units/hr (04/25/24 1237)   micafungin  (MYCAMINE ) 150 mg in sodium chloride  0.9 % 100 mL IVPB Stopped (04/25/24 1130)   piperacillin -tazobactam (ZOSYN )  IV Stopped (04/25/24 0936)   prismasol  BGK 4/2.5 Stopped (04/25/24 0645)   prismasol  BGK 4/2.5 Stopped (04/25/24 0645)   prismasol  BGK 4/2.5 Stopped (04/25/24 0645)   TPN ADULT (ION) 75 mL/hr at 04/25/24 1131   TPN ADULT (ION)      Scheduled Medications:  Chlorhexidine  Gluconate Cloth  6 each Topical Q2200   feeding supplement (PROSource TF20)  60 mL Per Tube Daily   feeding supplement (VITAL HIGH PROTEIN)  1,000 mL Per Tube Q24H   fentaNYL  (SUBLIMAZE ) injection  25-50 mcg Intravenous Once   hydrocortisone  sod succinate (SOLU-CORTEF ) inj  50 mg Intravenous Q12H   insulin  aspart  0-20 Units Subcutaneous Q4H   insulin  aspart  5 Units Subcutaneous Q4H   insulin  glargine-yfgn  50 Units Subcutaneous Daily   mouth rinse  15 mL Mouth Rinse 4 times per day   polyethylene glycol  17 g Per Tube Daily    have reviewed scheduled and prn medications.  Physical Exam: General: extubated, sleepy but  arousable Heart:RRR, s1s2 nl Lungs: Coarse breath sound bilateral.   Abdomen: Multiple abdominal line and drain present, mild distention. Extremities: 1+ diffuse edema present Dialysis Access: LIJ Temporary HD line c/d/i  Baron Border 04/25/2024,1:26 PM  LOS: 15 days

## 2024-04-25 NOTE — Procedures (Signed)
 Extubation Procedure Note  Patient Details:   Name: Kristopher Thompson DOB: Nov 19, 1950 MRN: [id number]   Airway Documentation:    Vent end date: 04/25/24 Vent end time: 1138   Evaluation  O2 sats: stable throughout Complications: No apparent complications Patient did tolerate procedure well. Bilateral Breath Sounds: Clear, Diminished   No Pt was successfully extubated to 3L [place] and is tolerating fairly well. Pt is able to somewhat protect airway at this time. Audible cuffleak heard prior extubation and no signs of stridor at this time.   Lorina Roosevelt 04/25/2024, 11:45 AM

## 2024-04-25 NOTE — Progress Notes (Signed)
   04/25/24 0801  Adult Ventilator Settings  Vent Type Servo i  Humidity HME  Vent Mode PSV;CPAP  FiO2 (%) 30 %  Pressure Support 5 cmH20  PEEP 8 cmH20   Pt placed on PS/CPAP mode and is tolerating well at this time.

## 2024-04-25 NOTE — Progress Notes (Signed)
 4 Days Post-Op   Chief Complaint/Subjective: Weaned off pressors, tolerating tube feeds  Objective: Vital signs in last 24 hours: Temp:  [97.3 F (36.3 C)-98.2 F (36.8 C)] 97.9 F (36.6 C) (06/14 0600) Pulse Rate:  [74-119] 93 (06/14 0600) Resp:  [15-28] 22 (06/14 0600) BP: (89-195)/(33-68) 119/60 (06/14 0600) SpO2:  [96 %-100 %] 100 % (06/14 0600) Arterial Line BP: (86-198)/(39-92) 171/92 (06/14 0600) FiO2 (%):  [28 %-30 %] 30 % (06/14 0801) Weight:  [117.1 kg] 117.1 kg (06/14 0442) Last BM Date : 04/25/24 Intake/Output from previous day: 06/13 0701 - 06/14 0700 In: 4276.8 [I.V.:2674.6; ZO/XW:9604; IV Piggyback:233.3] Out: 8298.3 [Urine:665; Emesis/NG output:30; Drains:327; Stool:535]  PE: Gen: intubated, sedated, arousable Resp: rhonchus, assisted Card: RRR Abd: soft, ostomy with liquid brown output, urostomy with some urine in bag, wound vac in place, no erythema  Lab Results:  Recent Labs    04/24/24 0338 04/25/24 0508  WBC 13.0* 11.4*  HGB 7.5* 7.5*  HCT 23.3* 23.5*  PLT 234 233   Recent Labs    04/24/24 1600 04/25/24 0618  NA 136 135  136  K 3.8 3.6  3.6  CL 101 104  104  CO2 24 22  23   GLUCOSE 143* 142*  141*  BUN 56* 54*  53*  CREATININE 2.07* 1.77*  1.75*  CALCIUM  7.2* 6.9*  7.0*   No results for input(s): LABPROT, INR in the last 72 hours.    Component Value Date/Time   NA 136 04/25/2024 0618   NA 135 04/25/2024 0618   K 3.6 04/25/2024 0618   K 3.6 04/25/2024 0618   CL 104 04/25/2024 0618   CL 104 04/25/2024 0618   CO2 23 04/25/2024 0618   CO2 22 04/25/2024 0618   GLUCOSE 141 (H) 04/25/2024 0618   GLUCOSE 142 (H) 04/25/2024 0618   BUN 53 (H) 04/25/2024 0618   BUN 54 (H) 04/25/2024 0618   CREATININE 1.75 (H) 04/25/2024 0618   CREATININE 1.77 (H) 04/25/2024 0618   CALCIUM  7.0 (L) 04/25/2024 0618   CALCIUM  6.9 (L) 04/25/2024 0618   PROT 4.9 (L) 04/23/2024 0416   ALBUMIN  1.9 (L) 04/25/2024 0618   AST 21 04/23/2024 0416    ALT 13 04/23/2024 0416   ALKPHOS 46 04/23/2024 0416   BILITOT 0.7 04/23/2024 0416   GFRNONAA 41 (L) 04/25/2024 0618   GFRNONAA 41 (L) 04/25/2024 0618   GFRAA >60 12/09/2018 0549    Assessment/Plan  s/p Procedure(s): COLECTOMY, PARTIAL 04/21/2024  Shock resolving off pressors a guardedly hopeful sign   Antibiotics. Zosyn /Micafungin  6/10-.  Culture showing rare yeast.  Anticipate he will need that for several weeks.  Perhaps 6.  Hopefully to quantify if this is Candida albicans that is sensitive to fluconazole  and can transition over to enteral route.  I would not switch it just yet given the severity of his infection and condition.   Tolerating tube feeds with some ileostomy bowel function.     Vent support. Per PCCM, weaning daily, possible extubation this weekend   Oliguria    Urostomy with increased urine output while on CRRT a guardedly hopeful sign.   Intraperitoneal drain near ileal conduit without elevated creatinine argues against any urine leak.  Dr. Rozanne Corners with urology agrees   Off CRRT this morning,possible transfer for HD needs   TPN to avoid malnutrition.   Change wound vac q TueThuSat   Follow mental status.  Most likely developing ICU psychosis/metabolic encephalopathy with all the stresses he has been through.  Defer to critical care about finding regimen that hopefully will sedate him.  Precedex  base may help him clear his chronic need for opiates during his shock episodes and vent.     ABLA on top of anemia of chronic disease improved with transfusion.  Follow.  CCM wanting him back on heparin  drip to avoid any clotting events since his history of PE   -monitor electrolytes & replace as needed.  Keep K>4, Mg>2, Phos>3.     -Diabetes.  Sliding scale insulin .  Some hyperglycemia not surprising with TPN and trophic tube feeds.  Agree with adjusting to resistant sliding scale 6/10.  Poss insulin  drip if he persists - defer to CCM   -VTE prophylaxis- SCDs.   Anticoagulation prophyllaxis SQ as appropriate.  Full anticoagulation heparin  drip given history of pulmonary embolism, CCM wished to be aggressive with hep gtt - follow Hgb & BP closely.  Discussed findings and care plan with his wife at bedside   LOS: 15 days   I reviewed last 24 h vitals and pain scores, last 48 h intake and output, last 24 h labs and trends, and last 24 h imaging results.  This care required high  level of medical decision making.   Alphonso Aschoff [facility] 04/25/2024, 8:26 AM Please see Amion for pager number during day hours 7:00am-4:30pm or 7:00am -11:30am on weekends

## 2024-04-26 DIAGNOSIS — N179 Acute kidney failure, unspecified: Secondary | ICD-10-CM | POA: Diagnosis not present

## 2024-04-26 DIAGNOSIS — A419 Sepsis, unspecified organism: Secondary | ICD-10-CM | POA: Diagnosis not present

## 2024-04-26 DIAGNOSIS — R6521 Severe sepsis with septic shock: Secondary | ICD-10-CM | POA: Diagnosis not present

## 2024-04-26 DIAGNOSIS — K430 Incisional hernia with obstruction, without gangrene: Secondary | ICD-10-CM | POA: Diagnosis not present

## 2024-04-26 LAB — CBC
HCT: 27.8 % — ABNORMAL LOW (ref 39.0–52.0)
HCT: 28.6 % — ABNORMAL LOW (ref 39.0–52.0)
Hemoglobin: 8.8 g/dL — ABNORMAL LOW (ref 13.0–17.0)
Hemoglobin: 9 g/dL — ABNORMAL LOW (ref 13.0–17.0)
MCH: 29.3 pg (ref 26.0–34.0)
MCH: 28.7 pg (ref 26.0–34.0)
MCHC: 31.7 g/dL (ref 30.0–36.0)
MCHC: 31.5 g/dL (ref 30.0–36.0)
MCV: 92.7 fL (ref 80.0–100.0)
MCV: 91.1 fL (ref 80.0–100.0)
Platelets: 335 10*3/uL (ref 150–400)
Platelets: 345 10*3/uL (ref 150–400)
RBC: 3 MIL/uL — ABNORMAL LOW (ref 4.22–5.81)
RBC: 3.14 MIL/uL — ABNORMAL LOW (ref 4.22–5.81)
RDW: 16.2 % — ABNORMAL HIGH (ref 11.5–15.5)
RDW: 16.1 % — ABNORMAL HIGH (ref 11.5–15.5)
WBC: 21.2 10*3/uL — ABNORMAL HIGH (ref 4.0–10.5)
WBC: 22 10*3/uL — ABNORMAL HIGH (ref 4.0–10.5)
nRBC: 0.2 % (ref 0.0–0.2)
nRBC: 0.2 % (ref 0.0–0.2)

## 2024-04-26 LAB — GLUCOSE, CAPILLARY
Glucose-Capillary: 134 mg/dL — ABNORMAL HIGH (ref 70–99)
Glucose-Capillary: 144 mg/dL — ABNORMAL HIGH (ref 70–99)
Glucose-Capillary: 147 mg/dL — ABNORMAL HIGH (ref 70–99)
Glucose-Capillary: 163 mg/dL — ABNORMAL HIGH (ref 70–99)
Glucose-Capillary: 169 mg/dL — ABNORMAL HIGH (ref 70–99)
Glucose-Capillary: 192 mg/dL — ABNORMAL HIGH (ref 70–99)
Glucose-Capillary: 196 mg/dL — ABNORMAL HIGH (ref 70–99)

## 2024-04-26 LAB — COMPREHENSIVE METABOLIC PANEL WITH GFR
ALT: 33 U/L (ref 0–44)
AST: 25 U/L (ref 15–41)
Albumin: 2 g/dL — ABNORMAL LOW (ref 3.5–5.0)
Alkaline Phosphatase: 58 U/L (ref 38–126)
Anion gap: 11 (ref 5–15)
BUN: 99 mg/dL — ABNORMAL HIGH (ref 8–23)
CO2: 21 mmol/L — ABNORMAL LOW (ref 22–32)
Calcium: 7.9 mg/dL — ABNORMAL LOW (ref 8.9–10.3)
Chloride: 106 mmol/L (ref 98–111)
Creatinine, Ser: 3.29 mg/dL — ABNORMAL HIGH (ref 0.61–1.24)
GFR, Estimated: 19 mL/min — ABNORMAL LOW (ref >60)
Glucose, Bld: 139 mg/dL — ABNORMAL HIGH (ref 70–99)
Potassium: 3.8 mmol/L (ref 3.5–5.1)
Sodium: 138 mmol/L (ref 135–145)
Total Bilirubin: 0.9 mg/dL (ref 0.0–1.2)
Total Protein: 5.6 g/dL — ABNORMAL LOW (ref 6.5–8.1)

## 2024-04-26 LAB — RENAL FUNCTION PANEL
Albumin: 1.9 g/dL — ABNORMAL LOW (ref 3.5–5.0)
Anion gap: 9 (ref 5–15)
BUN: 100 mg/dL — ABNORMAL HIGH (ref 8–23)
CO2: 21 mmol/L — ABNORMAL LOW (ref 22–32)
Calcium: 8.3 mg/dL — ABNORMAL LOW (ref 8.9–10.3)
Chloride: 101 mmol/L (ref 98–111)
Creatinine, Ser: 3.19 mg/dL — ABNORMAL HIGH (ref 0.61–1.24)
GFR, Estimated: 20 mL/min — ABNORMAL LOW (ref >60)
Glucose, Bld: 620 mg/dL (ref 70–99)
Phosphorus: 5.8 mg/dL — ABNORMAL HIGH (ref 2.5–4.6)
Potassium: 3.7 mmol/L (ref 3.5–5.1)
Sodium: 131 mmol/L — ABNORMAL LOW (ref 135–145)

## 2024-04-26 LAB — MAGNESIUM
Magnesium: 2.6 mg/dL — ABNORMAL HIGH (ref 1.7–2.4)
Magnesium: 2.6 mg/dL — ABNORMAL HIGH (ref 1.7–2.4)

## 2024-04-26 LAB — PHOSPHORUS: Phosphorus: 5.9 mg/dL — ABNORMAL HIGH (ref 2.5–4.6)

## 2024-04-26 LAB — HEPARIN LEVEL (UNFRACTIONATED)
Heparin Unfractionated: 0.21 [IU]/mL — ABNORMAL LOW (ref 0.30–0.70)
Heparin Unfractionated: 0.38 [IU]/mL (ref 0.30–0.70)
Heparin Unfractionated: 0.49 [IU]/mL (ref 0.30–0.70)

## 2024-04-26 LAB — HEPATITIS B SURFACE ANTIGEN: Hepatitis B Surface Ag: NONREACTIVE

## 2024-04-26 MED ORDER — FUROSEMIDE 10 MG/ML IJ SOLN
100.0000 mg | Freq: Once | INTRAVENOUS | Status: AC
  Administered 2024-04-26: 100 mg via INTRAVENOUS
  Filled 2024-04-26: qty 10

## 2024-04-26 MED ORDER — PROSOURCE TF20 ENFIT COMPATIBL EN LIQD
60.0000 mL | Freq: Every day | ENTERAL | Status: DC
  Administered 2024-04-27: 60 mL
  Filled 2024-04-26: qty 60

## 2024-04-26 MED ORDER — AMLODIPINE BESYLATE 5 MG PO TABS
5.0000 mg | ORAL_TABLET | Freq: Every day | ORAL | Status: DC
  Administered 2024-04-26: 5 mg
  Filled 2024-04-26: qty 1

## 2024-04-26 MED ORDER — VITAL HIGH PROTEIN PO LIQD
1000.0000 mL | ORAL | Status: DC
  Administered 2024-04-26 (×2): 1000 mL

## 2024-04-26 MED ORDER — CHLORHEXIDINE GLUCONATE CLOTH 2 % EX PADS
6.0000 | MEDICATED_PAD | Freq: Every day | CUTANEOUS | Status: DC
Start: 2024-04-27 — End: 2024-05-01
  Administered 2024-04-27 – 2024-04-29 (×4): 6 via TOPICAL

## 2024-04-26 MED ORDER — PROSOURCE TF20 ENFIT COMPATIBL EN LIQD: 60.0000 mL | Freq: Four times a day (QID) | ENTERAL | Status: DC

## 2024-04-26 MED ORDER — FENTANYL CITRATE PF 50 MCG/ML IJ SOSY
25.0000 ug | PREFILLED_SYRINGE | INTRAMUSCULAR | Status: DC | PRN
  Administered 2024-04-26 – 2024-04-27 (×3): 25 ug via INTRAVENOUS
  Filled 2024-04-26 (×3): qty 1

## 2024-04-26 MED ORDER — TRAVASOL 10 % IV SOLN
INTRAVENOUS | Status: AC
  Filled 2024-04-26: qty 480

## 2024-04-26 NOTE — Progress Notes (Signed)
 PHARMACY - ANTICOAGULATION CONSULT NOTE  Pharmacy Consult for Heparin  Indication: h/o PE  Allergies  Allergen Reactions   Demerol  [Meperidine ] Nausea And Vomiting and Other (See Comments)    SEVERE NAUSEA    Patient Measurements: Height: 5' 11 (180.3 cm) Weight: 113.8 kg (250 lb 14.1 oz) IBW/kg (Calculated) : 75.3 HEPARIN  DW (KG): 100.8  Vital Signs: Temp: 98.5 F (36.9 C) (06/15 1942) Temp Source: Axillary (06/15 1942) BP: 127/56 (06/15 2200) Pulse Rate: 86 (06/15 2200)  Labs: Recent Labs    04/24/24 0338 04/24/24 0601 04/24/24 0745 04/25/24 0508 04/25/24 0618 04/26/24 0507 04/26/24 0508 04/26/24 0644 04/26/24 1404 04/26/24 2200  HGB 7.5*  --   --  7.5*  --  8.8*  --  9.0*  --   --   HCT 23.3*  --   --  23.5*  --  27.8*  --  28.6*  --   --   PLT 234  --   --  233  --  335  --  345  --   --   APTT 189* 167*  --   --   --   --   --   --   --   --   HEPARINUNFRC  --   --    < > 0.67  --   --  0.21*  --  0.38 0.49  CREATININE 1.85*  --    < >  --  1.77*  1.75* 3.19*  --  3.29*  --   --    < > = values in this interval not displayed.    Estimated Creatinine Clearance: 26.4 mL/min (A) (by C-G formula based on SCr of 3.29 mg/dL (H)).   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   moderate atypia - right low back   Atypical nevus 04/04/2007   moderate to marked - right upper back (wider shave)   Atypical nevus 04/04/2007   moderate atypia - center chest (wider shave)   Atypical nevus 04/04/2007   slight atypia - right thigh   Atypical nevus 11/29/2011   mild atypia - center upper back   Atypical nevus 11/29/2011   mild atypia - center chest   Bacteremia due to Klebsiella pneumoniae 10/09/2017   Bladder cancer (HCC) dx 07/2017   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  s/p  cystectomy w/ ileal conduit urinary diversion   Candida  infection    CHF (congestive heart failure) (HCC)    Colostomy in place [facility])    since 08-08-2017-- per pt 12-25-2017 reddness around stoma   Diabetes mellitus without complication (HCC)    GERD (gastroesophageal reflux disease)    H/O hiatal hernia    History of sepsis 09/2017   dx bacteremia due to klebsiella pneumoniae,  post op intraabdominal abscess   Prostate cancer [facility]) urologist-- dr Rozanne Corners   10-02-2012 s/p  prostatectomy-- Stage T1c   RBBB    Renal disorder    pt. denies   Sleep apnea    cpap   Squamous cell carcinoma of skin 05/22/2013   left cheek - CX3 + 5FU   Wears glasses     Assessment: AC/Heme: Eliquis PTA (LD 5/27) for h/o PE. >> heparin  drip  - Hep level now therapeutic on current rate of 1900 units/hr - Improved Hgb, plts stable - per RN no  interruptions or bleeding  6/15 PM update:  Heparin  level therapeutic x 2   Goal of Therapy:  Heparin  level 0.3-0.7 units/ml Monitor platelets by anticoagulation protocol: Yes   Plan:  - Continue IV heparin  at current rate of 1900 units/hr - Daily CBC/heparin  level - Monitor for bleeding  Silvestre Drum, PharmD, BCPS Clinical Pharmacist Phone: [phone]

## 2024-04-26 NOTE — Progress Notes (Signed)
 PHARMACY - TOTAL PARENTERAL NUTRITION CONSULT NOTE   Indication: Prolonged ileus  Patient Measurements: Height: 5' 11 (180.3 cm) Weight: 113.8 kg (250 lb 14.1 oz) IBW/kg (Calculated) : 75.3 TPN AdjBW (KG): 85.6 Body mass index is 34.99 kg/m.  Assessment: Pt is a 78 yoM who underwent hernia repair and urostomy revision on 04/10/24. Started TPN 6/2 for post-op ileus. Return to OR early 6/9 for perforated bowel s/p drainage of abscesses, small bowel resection (in discontinuity) with placement of wound VAC. He returned to the ICU intubated and requiring vasopressor support. On 6/10, patient returned to OR and is now s/p abdominal wall debridement, ileal resection, end ileostomy, hernia repairs and fascia closure w/ placement of wound vac. He was started on trickle feeds. He remains on TPN pending ability to advance tube feeds.  Glucose / Insulin : Hx DM on metformin PTA. A1c 5.3% -CBGs 147-169 last 24h: 25 units SSI last 24h -Semglee  50 units + 5 units /4hrs scheduled Novolog  (30 units/d) + SSI (25 ut) -Stress dose steroids HC started 6/10 am, now tapering  Electrolytes:  -Mg 2.6 remains elevated today (CRRT now stopped 6/14, removed from TPN) -K+ 3.8 -no K+ in TPN since 6/9 (goal >=4 with ileus) - Phos 5.9 now elevated -CorrCa now up to 9.5   Renal: 6/14 CRRT OFF; only 665 ml UOP yesterday  Hepatic: albumin  1.9; LFTs WNL;  TG 64 (6/10) now up to 195 (6/13). Was on Propofol  6/9-6/11. Check again Monday.  Intake / Output; MIVF:  -Minimal UOP 365, on CRRT -NG/emesis 30 ml yesterday -Drain > 327 yesterday -Ileostomy OP 535  GI Imaging: -6/9: Air-fluid collection in the subcutaneous soft tissues of the right anterior pelvic wall extending from the midline to the ileostomy -6/3: Right lower quadrant small bowel loop with mural defect and adjacent extraluminal gas identified, which is concerning for underlying small bowel perforation. GI Surgeries / Procedures:  -6/10: Abdominal wall  debridement, ileal resection, end ileostomy, hernia repairs, fascia closure w/ placement of wound vac -6/9: Perforated bowel - drainage of right abdominal wall abscess as well as intra-abdominal abscess, explantation of abdominal mesh, small bowel resection (in discontinuity), placement of wound VAC -5/30: Hernia repair, lysis of adhesions, urostomy revision  Central access: PICC TPN start date: 6/2   Nutritional Goals: - Goal TPN rate is 90 mL/hr (provides 101.5 g of protein and 2047 kcals per day)  or - Vital 1.5 at 55 ml/h (1320 ml per day + 115g protein) + Prosource TF20 60 ml QID (320kcal and 80g protein/d) = 1640 kcal and 195g protein    RD Assessment: Estimated Needs Total Energy Estimated Needs: 2100-2300 kcals Total Protein Estimated Needs: 155-180 grams Total Fluid Estimated Needs: >/= 2.1L  Current Nutrition:  TPN : decr to 72ml/hr Tube feeds at 30 ml/hr  Plan: Decrease TPN to rate 40 mL/hr with increase in TF rate to goal. (Prosource not at goal) Electrolytes in TPN:  Na 75 meq/L, remove others Cl:Ac 1:1 Add standard MVI and trace elements to TPN Insulin : (no change today)  Semglee  50 units daily Continues on resistant SSI q4hr  Novolog  to 5 units q4hr TF coverage to be given in addition to the SSI (hold if tube feeds are held/stopped) Triglyceride recheck on Monday Monitor TPN labs on Mon/Thurs and PRN Paged Dr. Dorrie Gaudier. Will con't TPN through the weekend. TF at goal 18ml/hr. Prosource once daily Will increase Prosource to QID per RD note 6/11 when the TPN is discontinued as not to overload protein. (Today  will be receiving 2158 kcal and 183g protein total TPN + TF + Prosource)   Hilliard Loyal, PharmD, BCPS Clinical Pharmacist 04/26/2024 9:42 AM

## 2024-04-26 NOTE — Progress Notes (Signed)
 5 Days Post-Op   Chief Complaint/Subjective: Extubated overnight, has started to give occasional 1 word answers  Objective: Vital signs in last 24 hours: Temp:  [97.7 F (36.5 C)-99.4 F (37.4 C)] 97.8 F (36.6 C) (06/15 0750) Pulse Rate:  [90-126] 94 (06/15 0700) Resp:  [16-25] 22 (06/15 0700) BP: (131-197)/(47-71) 170/62 (06/15 0700) SpO2:  [97 %-100 %] 100 % (06/15 0700) Arterial Line BP: (121-202)/(44-88) 153/50 (06/15 0700) FiO2 (%):  [30 %] 30 % (06/14 0801) Weight:  [113.8 kg] 113.8 kg (06/15 0409) Last BM Date : 04/25/24 Intake/Output from previous day: 06/14 0701 - 06/15 0700 In: 3210.4 [I.V.:2061.7; NG/GT:889.2; IV Piggyback:259.6] Out: 2855 [Urine:2100; Drains:255; Stool:500]  PE: Gen: somnolent, opens eyes to voice Resp: nonlabored Card: tachycardic Abd: vac in place, drains serous, ostomy with some brown output, urostomy with some urine  Lab Results:  Recent Labs    04/26/24 0507 04/26/24 0644  WBC 21.2* 22.0*  HGB 8.8* 9.0*  HCT 27.8* 28.6*  PLT 335 345   Recent Labs    04/26/24 0507 04/26/24 0644  NA 131* 138  K 3.7 3.8  CL 101 106  CO2 21* 21*  GLUCOSE 620* 139*  BUN 100* 99*  CREATININE 3.19* 3.29*  CALCIUM  8.3* 7.9*   No results for input(s): LABPROT, INR in the last 72 hours.    Component Value Date/Time   NA 138 04/26/2024 0644   K 3.8 04/26/2024 0644   CL 106 04/26/2024 0644   CO2 21 (L) 04/26/2024 0644   GLUCOSE 139 (H) 04/26/2024 0644   BUN 99 (H) 04/26/2024 0644   CREATININE 3.29 (H) 04/26/2024 0644   CALCIUM  7.9 (L) 04/26/2024 0644   PROT 5.6 (L) 04/26/2024 0644   ALBUMIN  2.0 (L) 04/26/2024 0644   AST 25 04/26/2024 0644   ALT 33 04/26/2024 0644   ALKPHOS 58 04/26/2024 0644   BILITOT 0.9 04/26/2024 0644   GFRNONAA 19 (L) 04/26/2024 0644   GFRAA >60 12/09/2018 0549    Assessment/Plan  Shock resolving off pressors a guardedly hopeful sign   Antibiotics. Zosyn /Micafungin  6/10-.  Culture showing rare yeast.   Anticipate he will need that for several weeks.  Perhaps 6.  Hopefully to quantify if this is Candida albicans that is sensitive to fluconazole  and can transition over to enteral route.  I would not switch it just yet given the severity of his infection and condition.   Tolerating tube feeds with some ileostomy bowel function.     Extubated 6/14   Oliguria - Cr slightly up today 3.19 to 3.29   Urostomy with increased urine output while on CRRT a guardedly hopeful sign.   Intraperitoneal drain near ileal conduit without elevated creatinine argues against any urine leak.  Dr. Rozanne Corners with urology agrees   possible transfer for HD needs   TPN to avoid malnutrition.   Change wound vac q TueThuSat   Follow mental status.  Most likely developing ICU psychosis/metabolic encephalopathy with all the stresses he has been through.  Defer to critical care about finding regimen that hopefully will sedate him.     ABLA on top of anemia of chronic disease improved with transfusion.  Follow.  CCM wanting him back on heparin  drip to avoid any clotting events since his history of PE   -monitor electrolytes & replace as needed.  Keep K>4, Mg>2, Phos>3.     -Diabetes.  Sliding scale insulin .  Some hyperglycemia not surprising with TPN and trophic tube feeds.  Agree with adjusting to resistant  sliding scale 6/10.  Poss insulin  drip if he persists - defer to CCM   -VTE prophylaxis- SCDs.  Anticoagulation prophyllaxis SQ as appropriate.  Full anticoagulation heparin  drip given history of pulmonary embolism, CCM wished to be aggressive with hep gtt - follow Hgb & BP closely.   LOS: 16 days   I reviewed last 24 h vitals and pain scores, last 48 h intake and output, last 24 h labs and trends, and last 24 h imaging results.  This care required high  level of medical decision making.   Alphonso Aschoff [facility] 04/26/2024, 7:57 AM Please see Amion for pager number during  day hours 7:00am-4:30pm or 7:00am -11:30am on weekends

## 2024-04-26 NOTE — Plan of Care (Signed)
  Problem: Clinical Measurements: Goal: Cardiovascular complication will be avoided Outcome: Progressing   Problem: Activity: Goal: Risk for activity intolerance will decrease Outcome: Progressing   Problem: Pain Managment: Goal: General experience of comfort will improve and/or be controlled Outcome: Progressing   Problem: Safety: Goal: Ability to remain free from injury will improve Outcome: Progressing   Problem: Skin Integrity: Goal: Risk for impaired skin integrity will decrease Outcome: Progressing

## 2024-04-26 NOTE — Progress Notes (Signed)
 PHARMACY - ANTICOAGULATION CONSULT NOTE  Pharmacy Consult for Heparin  Indication: h/o PE  Allergies  Allergen Reactions   Demerol  [Meperidine ] Nausea And Vomiting and Other (See Comments)    SEVERE NAUSEA    Patient Measurements: Height: 5' 11 (180.3 cm) Weight: 113.8 kg (250 lb 14.1 oz) IBW/kg (Calculated) : 75.3 HEPARIN  DW (KG): 100.8  Vital Signs: Temp: 98.4 F (36.9 C) (06/15 0400) Temp Source: Axillary (06/15 0400) BP: 141/52 (06/15 0400) Pulse Rate: 99 (06/15 0400)  Labs: Recent Labs    04/24/24 0338 04/24/24 0601 04/24/24 0745 04/24/24 1600 04/25/24 0508 04/25/24 0618 04/26/24 0507 04/26/24 0508  HGB 7.5*  --   --   --  7.5*  --  8.8*  --   HCT 23.3*  --   --   --  23.5*  --  27.8*  --   PLT 234  --   --   --  233  --  335  --   APTT 189* 167*  --   --   --   --   --   --   HEPARINUNFRC  --   --  0.60  --  0.67  --   --  0.21*  CREATININE 1.85*  --   --  2.07*  --  1.77*  1.75*  --   --     Estimated Creatinine Clearance: 49.7 mL/min (A) (by C-G formula based on SCr of 1.75 mg/dL (H)).   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   moderate atypia - right low back   Atypical nevus 04/04/2007   moderate to marked - right upper back (wider shave)   Atypical nevus 04/04/2007   moderate atypia - center chest (wider shave)   Atypical nevus 04/04/2007   slight atypia - right thigh   Atypical nevus 11/29/2011   mild atypia - center upper back   Atypical nevus 11/29/2011   mild atypia - center chest   Bacteremia due to Klebsiella pneumoniae 10/09/2017   Bladder cancer (HCC) dx 07/2017   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 denies   Sleep apnea    cpap   Squamous cell carcinoma of skin 05/22/2013   left cheek - CX3 + 5FU   Wears glasses     Assessment: AC/Heme: Eliquis PTA (LD 5/27) for h/o PE. >> heparin  drip - Hep level 0.21, Hgb 8.8, plts WNL - per RN no interruptions or bleeding  Goal of Therapy:  Heparin  level 0.3-0.7 units/ml Monitor platelets by anticoagulation protocol: Yes   Plan:  - Heparin  CRRT SYRINGE noted OFF 6/14 AM - increase heparin  drip to 1900 units/hr - heparin  level in 8 hours - Daily HL and CBC, d/c aPTTs  Beau Bound RPh 04/26/2024, 5:32 AM

## 2024-04-26 NOTE — Progress Notes (Signed)
 eLink Physician-Brief Progress Note Patient Name: Kristopher Thompson DOB: [birth year] MRN: [id number]   Date of Service  04/26/2024  HPI/Events of Note  Notified about a.m. lab abnormalities including critical glucose of 620..  Labs drawn from TPN line, concern for potential contamination  eICU Interventions  Redrawing labs now.  Will cover for hyperglycemia if point-of-care testing affirms glucose read.     Intervention Category Intermediate Interventions: Hyperglycemia - evaluation and treatment  Boomer Winders 04/26/2024, 6:40 AM

## 2024-04-26 NOTE — Progress Notes (Signed)
 NAME:  Kristopher Thompson, MRN:  [id number], DOB:  June 14, 1951, LOS: 16 ADMISSION DATE:  04/10/2024, CONSULTATION DATE:  04/13/2024 REFERRING MD:  Dr Bonita Bussing, CHIEF COMPLAINT: Sepsis  History of Present Illness:  S/p repair of incisional/parastomal and ventral hernias, left inguinal incarcerated , abdominal hernias, lysis of adhesions, urostomy revision Day 1 postoperatively  Transferred to the ICU secondary to tachycardia, tachypnea  History of bladder cancer, prostate cancer, diastolic heart failure, diabetes, right bundle branch block, multiple abdominal surgeries  Update 04/20/24: ccm reconsulted post operatively after pt had perforated bowel and returned to OR. Pt is requiring neo and norepi at this time. He remains intubated at this time. Profoundly acidotic with bicarb of 12. Will start sodium bicarb infusion and bolus prior to initiation. Follow labs in am. Updated wife and son at bedside.   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   moderate atypia - right low back   Atypical nevus 04/04/2007   moderate to marked - right upper back (wider shave)   Atypical nevus 04/04/2007   moderate atypia - center chest (wider shave)   Atypical nevus 04/04/2007   slight atypia - right thigh   Atypical nevus 11/29/2011   mild atypia - center upper back   Atypical nevus 11/29/2011   mild atypia - center chest   Bacteremia due to Klebsiella pneumoniae 10/09/2017   Bladder cancer (HCC) dx 07/2017   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 denies   Sleep apnea    cpap   Squamous cell carcinoma of skin 05/22/2013   left cheek - CX3 + 5FU   Wears glasses    Significant Hospital Events: Including procedures, antibiotic start and stop dates in addition to other pertinent events   04/10/2024-laparoscopic surgery 04/13/24 CCM consult. Incr NE. Art line  6/3 weaning NE. Gently diuresed w 3L off  6/4 off NE. Pulled out his NGT refused replacement. After multiple emesis  6/5 cont NGT 6/6 tx to med surg 6/9: taken back to OR for perforated bowel, post-op shock 6/10: Norepinephrine , Phenylephrine , and vasopressin , added steroids, 1u PRBCs, back to OR for washout and closure 6/11: Worsening AKI with fluid overload, start CRRT, decreasing pressor requirements 6/13 tolerating CRRT 6/14: Off CRRT, arousable.  Tolerated weaning for most of the day 6/13 6/14: Extubated  Interim History / Subjective:   Extubated 6/14 Off CRRT A little stronger today No overnight events  Objective    Blood pressure (!) 170/62, pulse 94, temperature 98.4 F (36.9 C), temperature source Axillary, resp. rate (!) 22, height 5' 11 (1.803 m), weight 113.8 kg, SpO2 100%. CVP:  [3 mmHg-10 mmHg] 7 mmHg  Vent Mode: PSV;CPAP FiO2 (%):  [30 %] 30 %  PEEP:  [8 cmH20] 8 cmH20 Pressure Support:  [5 cmH20] 5 cmH20   Intake/Output Summary (Last 24 hours) at 04/26/2024 0732 Last data filed at 04/26/2024 1308 Gross per 24 hour  Intake 3210.41 ml  Output 2855 ml  Net 355.41 ml   Filed Weights   04/24/24 0547 04/25/24 0442 04/26/24 0409  Weight: 116.4 kg 117.1 kg 113.8 kg    Examination: General: Chronically ill-appearing,  neuro: Following commands, frail HEENT: Moist oral mucosa RESP: Clear breath sounds to auscultation anteriorly CV:  S1-S2 appreciated, peripheral edema GI: Bowel sounds appreciated, JP drains in place GU: urostomy with no urine present Skin: warm and dry, abdominal wound covered with wound vac  I reviewed nursing notes, Consultant notes,last 24 h vitals and pain scores, last 48 h intake and output, last 24 h labs and trends, and last 24 h imaging results.  On micafungin -day 7 On Zosyn -day 7 Last blood culture 6/9-no growth  Resolved problem list    Assessment and Plan   Septic shock due to acute bowel perforation, was in OR 6/9, back to the OR 6/10 for closure and ileal resection and end ileostomy creation - On antibiotics - On antifungals - Tapering doses of steroids -Off pressors -Blood cultures with no growth -Abdominal cultures with rare E. coli, few Enterococcus faecalis, few Candida albicans  Hypoxemic respiratory failure - Extubated 6/14 and tolerating okay - Pulmonary toileting  Acute kidney injury Metabolic acidosis - Off CRRT - Appreciate nephrology following - He is making urine but may still need IHD  Hypertension - Added metoprolol , -Will add 5 mg Norvasc  History of diabetes - Continue SSI  Anemia Right flank hematoma History of PE, was on Eliquis at home - Continue heparin  at present  History of congestive heart failure - Continue to trend CVP  Moderate protein calorie malnutrition - Tube feeds will be advanced   Best Practice (right click and Reselect all SmartList Selections daily)   Diet/type: tubefeeds on tpn DVT prophylaxis systemic heparin   Pressure ulcer(s): N/A GI prophylaxis: H2B Lines: Central line, PICC art line  Foley:  N/A urostomy  Code Status:  full code Last date of multidisciplinary goals of care discussion [spouse updated daily]  The patient is critically ill with multiple organ systems failure and requires high complexity decision making for assessment and support, frequent evaluation and titration of therapies, application of  advanced monitoring technologies and extensive interpretation of multiple databases. Critical Care Time devoted to patient care services described in this note independent of APP/resident time (if applicable)  is 36 minutes.   Myer Artis MD [facility] Personal pager: See Amion If unanswered, please page CCM On-call: #[phone]

## 2024-04-26 NOTE — Progress Notes (Signed)
 PHARMACY - ANTICOAGULATION CONSULT NOTE  Pharmacy Consult for Heparin  Indication: h/o PE  Allergies  Allergen Reactions   Demerol  [Meperidine ] Nausea And Vomiting and Other (See Comments)    SEVERE NAUSEA    Patient Measurements: Height: 5' 11 (180.3 cm) Weight: 113.8 kg (250 lb 14.1 oz) IBW/kg (Calculated) : 75.3 HEPARIN  DW (KG): 100.8  Vital Signs: Temp: 97.2 F (36.2 C) (06/15 1142) Temp Source: Axillary (06/15 1142) BP: 176/65 (06/15 1341) Pulse Rate: 79 (06/15 1200)  Labs: Recent Labs    04/24/24 0338 04/24/24 0601 04/24/24 0745 04/25/24 0508 04/25/24 0618 04/26/24 0507 04/26/24 0508 04/26/24 0644 04/26/24 1404  HGB 7.5*  --   --  7.5*  --  8.8*  --  9.0*  --   HCT 23.3*  --   --  23.5*  --  27.8*  --  28.6*  --   PLT 234  --   --  233  --  335  --  345  --   APTT 189* 167*  --   --   --   --   --   --   --   HEPARINUNFRC  --   --    < > 0.67  --   --  0.21*  --  0.38  CREATININE 1.85*  --    < >  --  1.77*  1.75* 3.19*  --  3.29*  --    < > = values in this interval not displayed.    Estimated Creatinine Clearance: 26.4 mL/min (A) (by C-G formula based on SCr of 3.29 mg/dL (H)).   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   moderate atypia - right low back   Atypical nevus 04/04/2007   moderate to marked - right upper back (wider shave)   Atypical nevus 04/04/2007   moderate atypia - center chest (wider shave)   Atypical nevus 04/04/2007   slight atypia - right thigh   Atypical nevus 11/29/2011   mild atypia - center upper back   Atypical nevus 11/29/2011   mild atypia - center chest   Bacteremia due to Klebsiella pneumoniae 10/09/2017   Bladder cancer (HCC) dx 07/2017   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 denies   Sleep apnea    cpap   Squamous cell carcinoma of skin 05/22/2013   left cheek - CX3 + 5FU   Wears glasses     Assessment: AC/Heme: Eliquis PTA (LD 5/27) for h/o PE. >> heparin  drip  - Hep level now therapeutic on current rate of 1900 units/hr - Improved Hgb, plts stable - per RN no interruptions or bleeding  Goal of Therapy:  Heparin  level 0.3-0.7 units/ml Monitor platelets by anticoagulation protocol: Yes  Plan:  - Heparin  CRRT SYRINGE noted OFF 6/14 AM - Continue IV heparin  at current rate of 1900 units/hr - Recheck heparin  level in 8 hours for confirmation - Daily HL and CBC   Luise Saint, PharmD, BCPS Secure Chat if ?s 04/26/2024 2:55 PM

## 2024-04-26 NOTE — Progress Notes (Addendum)
 [facility] NEPHROLOGY PROGRESS NOTE  Assessment/ Plan: Pt is a 73 y.o. yo male  with past medical history significant for hypertension, DM, CAD, bladder cancer, CHF, right bundle branch block who had acute bowel perforation and status post OR for bowel repair on 6/8, lysis of adhesion, ileal resection end ileostomy hernia repairs, septic shock seen as a consultation for the evaluation and management of AKI and oliguria.   # Acute kidney injury, oliguric with anasarca likely ischemic ATN due to septic shock and obstructive uropathy given bilateral hydronephrosis.  CRRT 6/11-6/14 now off monitoring for recovery.  UOP great now but clearance lagging.  BUN remains ~100.  In light of decreased mental status, will move to [facility] and plan for iHD tomorrow for clearance.  Expect to see ongoing recovery of kidney function in the next few days/weeks.   # Bilateral hydronephrosis severe on the left side: S/p revision of ileal conduit 5/30,  urology following.    # Acute bowel perforation is status post bowel repair ileal conduit and hernia repairs urostomy ileal conduit revision.  As per surgical team.    # HTN: BB and amlodipine added today, likely volume contributing, net + 7.4L, one time lasix  today. UF 1.5L with HD tomorrow. Avoid hypotension.   # Acute hypoxic respiratory failure/VDRF per pulmonary team. Extubated 6/14, currently on 3L [place].     Will follow.   Subjective: Seen and examined in ICU.  UOP improved further to 2.1L yesterday.  Afebrile, hypertensive. Wife bedside - he's not been very awake since extubation but is interacting just a bit.   Objective Vital signs in last 24 hours: Vitals:   04/26/24 0900 04/26/24 1000 04/26/24 1024 04/26/24 1025  BP: (!) 178/66 (!) 188/64 (!) 188/64 (!) 188/64  Pulse: 90 87  92  Resp: 20 17    Temp:      TempSrc:      SpO2: 97% 97%    Weight:      Height:       Weight change: -3.3 kg  Intake/Output Summary (Last 24 hours) at 04/26/2024  1042 Last data filed at 04/26/2024 1034 Gross per 24 hour  Intake 3251.09 ml  Output 3805 ml  Net -553.91 ml       Labs: RENAL PANEL Recent Labs  Lab 04/23/24 0416 04/23/24 1600 04/24/24 0338 04/24/24 1600 04/25/24 0508 04/25/24 0618 04/26/24 0507 04/26/24 0644 04/26/24 0716  NA 134*  135   < > 133* 136  --  135  136 131* 138  --   K 4.3  4.4   < > 3.6 3.8  --  3.6  3.6 3.7 3.8  --   CL 99  101   < > 97* 101  --  104  104 101 106  --   CO2 23  24   < > 25 24  --  22  23 21* 21*  --   GLUCOSE 235*  231*   < > 154* 143*  --  142*  141* 620* 139*  --   BUN 68*  68*   < > 59* 56*  --  54*  53* 100* 99*  --   CREATININE 2.85*  2.99*   < > 1.85* 2.07*  --  1.77*  1.75* 3.19* 3.29*  --   CALCIUM  6.7*  6.8*   < > 6.8* 7.2*  --  6.9*  7.0* 8.3* 7.9*  --   MG 2.4  --  2.4  --  2.8*  --  2.6*  --  2.6*  PHOS 5.4*  5.2*   < > 3.2 3.3  --  2.7 5.8*  --  5.9*  ALBUMIN  1.8*  1.7*   < > 2.1* 2.1*  --  1.9* 1.9* 2.0*  --    < > = values in this interval not displayed.    Liver Function Tests: Recent Labs  Lab 04/21/24 1715 04/22/24 1604 04/23/24 0416 04/23/24 1600 04/25/24 0618 04/26/24 0507 04/26/24 0644  AST 19  --  21  --   --   --  25  ALT 12  --  13  --   --   --  33  ALKPHOS 48  --  46  --   --   --  58  BILITOT 0.7  --  0.7  --   --   --  0.9  PROT 4.7*  --  4.9*  --   --   --  5.6*  ALBUMIN  2.1*   < > 1.8*  1.7*   < > 1.9* 1.9* 2.0*   < > = values in this interval not displayed.   No results for input(s): LIPASE, AMYLASE in the last 168 hours. No results for input(s): AMMONIA in the last 168 hours. CBC: Recent Labs    04/23/24 0416 04/24/24 0338 04/25/24 0508 04/26/24 0507 04/26/24 0644  HGB 8.0* 7.5* 7.5* 8.8* 9.0*  MCV 89.5 87.3 91.4 92.7 91.1    Cardiac Enzymes: No results for input(s): CKTOTAL, CKMB, CKMBINDEX, TROPONINI in the last 168 hours. CBG: Recent Labs  Lab 04/25/24 1610 04/25/24 1955 04/26/24 0018  04/26/24 0348 04/26/24 0745  GLUCAP 147* 148* 169* 163* 144*    Iron Studies: No results for input(s): IRON, TIBC, TRANSFERRIN, FERRITIN in the last 72 hours. Studies/Results: No results found.   Medications: Infusions:  sodium chloride  10 mL/hr at 04/26/24 1027   heparin  1,900 Units/hr (04/26/24 1027)   micafungin  (MYCAMINE ) 150 mg in sodium chloride  0.9 % 100 mL IVPB 150 mg (04/26/24 1027)   piperacillin -tazobactam (ZOSYN )  IV Stopped (04/26/24 0959)   TPN ADULT (ION) 50 mL/hr at 04/26/24 1027   TPN ADULT (ION)      Scheduled Medications:  amLODipine  5 mg Per Tube Daily   Chlorhexidine  Gluconate Cloth  6 each Topical Q2200   [START ON 04/27/2024] feeding supplement (PROSource TF20)  60 mL Per Tube Daily   feeding supplement (VITAL HIGH PROTEIN)  1,000 mL Per Tube Q24H   hydrocortisone  sod succinate (SOLU-CORTEF ) inj  50 mg Intravenous Q12H   insulin  aspart  0-20 Units Subcutaneous Q4H   insulin  aspart  5 Units Subcutaneous Q4H   insulin  glargine-yfgn  50 Units Subcutaneous Daily   metoprolol  tartrate  25 mg Per Tube BID   mouth rinse  15 mL Mouth Rinse 4 times per day   polyethylene glycol  17 g Per Tube Daily    have reviewed scheduled and prn medications.  Physical Exam: General: extubated, sleepy but arousable, not answering my questions Heart:RRR, s1s2 nl Lungs: Coarse breath sound bilateral.   Abdomen: Multiple abdominal line and drain present, mild distention. Extremities: 1+ diffuse edema present Dialysis Access: LIJ Temporary HD line c/d/i  Baron Border 04/26/2024,10:42 AM  LOS: 16 days

## 2024-04-27 ENCOUNTER — Encounter (HOSPITAL_COMMUNITY): Payer: Self-pay | Admitting: Surgery

## 2024-04-27 ENCOUNTER — Inpatient Hospital Stay (HOSPITAL_COMMUNITY)

## 2024-04-27 DIAGNOSIS — G9341 Metabolic encephalopathy: Secondary | ICD-10-CM | POA: Diagnosis not present

## 2024-04-27 DIAGNOSIS — J9601 Acute respiratory failure with hypoxia: Secondary | ICD-10-CM | POA: Diagnosis not present

## 2024-04-27 DIAGNOSIS — A419 Sepsis, unspecified organism: Secondary | ICD-10-CM | POA: Diagnosis not present

## 2024-04-27 DIAGNOSIS — K430 Incisional hernia with obstruction, without gangrene: Secondary | ICD-10-CM | POA: Diagnosis not present

## 2024-04-27 LAB — GLUCOSE, CAPILLARY
Glucose-Capillary: 100 mg/dL — ABNORMAL HIGH (ref 70–99)
Glucose-Capillary: 128 mg/dL — ABNORMAL HIGH (ref 70–99)
Glucose-Capillary: 149 mg/dL — ABNORMAL HIGH (ref 70–99)
Glucose-Capillary: 151 mg/dL — ABNORMAL HIGH (ref 70–99)
Glucose-Capillary: 153 mg/dL — ABNORMAL HIGH (ref 70–99)
Glucose-Capillary: 163 mg/dL — ABNORMAL HIGH (ref 70–99)
Glucose-Capillary: 179 mg/dL — ABNORMAL HIGH (ref 70–99)

## 2024-04-27 LAB — CBC
HCT: 27.7 % — ABNORMAL LOW (ref 39.0–52.0)
Hemoglobin: 8.8 g/dL — ABNORMAL LOW (ref 13.0–17.0)
MCH: 28.4 pg (ref 26.0–34.0)
MCHC: 31.8 g/dL (ref 30.0–36.0)
MCV: 89.4 fL (ref 80.0–100.0)
Platelets: 373 10*3/uL (ref 150–400)
RBC: 3.1 MIL/uL — ABNORMAL LOW (ref 4.22–5.81)
RDW: 16.7 % — ABNORMAL HIGH (ref 11.5–15.5)
WBC: 20.7 10*3/uL — ABNORMAL HIGH (ref 4.0–10.5)
nRBC: 0.3 % — ABNORMAL HIGH (ref 0.0–0.2)

## 2024-04-27 LAB — COMPREHENSIVE METABOLIC PANEL WITH GFR
ALT: 29 U/L (ref 0–44)
AST: 20 U/L (ref 15–41)
Albumin: 1.8 g/dL — ABNORMAL LOW (ref 3.5–5.0)
Alkaline Phosphatase: 58 U/L (ref 38–126)
Anion gap: 15 (ref 5–15)
BUN: 142 mg/dL — ABNORMAL HIGH (ref 8–23)
CO2: 18 mmol/L — ABNORMAL LOW (ref 22–32)
Calcium: 8.1 mg/dL — ABNORMAL LOW (ref 8.9–10.3)
Chloride: 108 mmol/L (ref 98–111)
Creatinine, Ser: 3.99 mg/dL — ABNORMAL HIGH (ref 0.61–1.24)
GFR, Estimated: 15 mL/min — ABNORMAL LOW (ref >60)
Glucose, Bld: 183 mg/dL — ABNORMAL HIGH (ref 70–99)
Potassium: 4 mmol/L (ref 3.5–5.1)
Sodium: 141 mmol/L (ref 135–145)
Total Bilirubin: 0.6 mg/dL (ref 0.0–1.2)
Total Protein: 5.5 g/dL — ABNORMAL LOW (ref 6.5–8.1)

## 2024-04-27 LAB — PHOSPHORUS: Phosphorus: 7.3 mg/dL — ABNORMAL HIGH (ref 2.5–4.6)

## 2024-04-27 LAB — RENAL FUNCTION PANEL
Albumin: 1.8 g/dL — ABNORMAL LOW (ref 3.5–5.0)
Anion gap: 16 — ABNORMAL HIGH (ref 5–15)
BUN: 144 mg/dL — ABNORMAL HIGH (ref 8–23)
CO2: 17 mmol/L — ABNORMAL LOW (ref 22–32)
Calcium: 8.4 mg/dL — ABNORMAL LOW (ref 8.9–10.3)
Chloride: 108 mmol/L (ref 98–111)
Creatinine, Ser: 3.87 mg/dL — ABNORMAL HIGH (ref 0.61–1.24)
GFR, Estimated: 16 mL/min — ABNORMAL LOW (ref >60)
Glucose, Bld: 183 mg/dL — ABNORMAL HIGH (ref 70–99)
Phosphorus: 7.3 mg/dL — ABNORMAL HIGH (ref 2.5–4.6)
Potassium: 4 mmol/L (ref 3.5–5.1)
Sodium: 141 mmol/L (ref 135–145)

## 2024-04-27 LAB — HEPARIN LEVEL (UNFRACTIONATED): Heparin Unfractionated: 0.46 [IU]/mL (ref 0.30–0.70)

## 2024-04-27 LAB — MAGNESIUM: Magnesium: 2.6 mg/dL — ABNORMAL HIGH (ref 1.7–2.4)

## 2024-04-27 LAB — PREALBUMIN: Prealbumin: 22 mg/dL (ref 18–38)

## 2024-04-27 MED ORDER — PROSOURCE TF20 ENFIT COMPATIBL EN LIQD
60.0000 mL | Freq: Two times a day (BID) | ENTERAL | Status: DC
  Administered 2024-04-27 – 2024-05-14 (×30): 60 mL
  Filled 2024-04-27 (×36): qty 60

## 2024-04-27 MED ORDER — ALBUMIN HUMAN 25 % IV SOLN
25.0000 g | Freq: Once | INTRAVENOUS | Status: AC
  Administered 2024-04-27: 25 g via INTRAVENOUS

## 2024-04-27 MED ORDER — ALBUMIN HUMAN 25 % IV SOLN
INTRAVENOUS | Status: AC
Start: 2024-04-27 — End: 2024-04-27
  Filled 2024-04-27: qty 200

## 2024-04-27 MED ORDER — CALCIUM POLYCARBOPHIL 625 MG PO TABS
625.0000 mg | ORAL_TABLET | Freq: Two times a day (BID) | ORAL | Status: DC
  Administered 2024-04-27 – 2024-05-07 (×19): 625 mg
  Filled 2024-04-27 (×23): qty 1

## 2024-04-27 MED ORDER — SODIUM CHLORIDE 0.9 % IV SOLN
100.0000 mg | INTRAVENOUS | Status: DC
  Filled 2024-04-27: qty 5

## 2024-04-27 MED ORDER — VITAL HIGH PROTEIN PO LIQD
1000.0000 mL | ORAL | Status: DC
  Administered 2024-04-27: 1000 mL

## 2024-04-27 MED ORDER — INSULIN GLARGINE-YFGN 100 UNIT/ML ~~LOC~~ SOLN
25.0000 [IU] | Freq: Every day | SUBCUTANEOUS | Status: DC
  Administered 2024-04-28: 25 [IU] via SUBCUTANEOUS
  Filled 2024-04-27 (×2): qty 0.25

## 2024-04-27 MED ORDER — CYCLOBENZAPRINE HCL 5 MG PO TABS: 5.0000 mg | ORAL_TABLET | Freq: Three times a day (TID) | ORAL | Status: DC | PRN

## 2024-04-27 MED ORDER — MAGIC MOUTHWASH: 15.0000 mL | Freq: Four times a day (QID) | ORAL | Status: DC | PRN

## 2024-04-27 MED ORDER — OXYCODONE HCL 5 MG PO TABS
5.0000 mg | ORAL_TABLET | ORAL | Status: DC | PRN
  Administered 2024-04-28: 5 mg
  Administered 2024-04-28 – 2024-05-02 (×2): 10 mg
  Administered 2024-05-02: 5 mg
  Administered 2024-05-02 – 2024-05-03 (×3): 10 mg
  Administered 2024-05-03: 5 mg
  Administered 2024-05-04 (×2): 10 mg
  Administered 2024-05-07: 5 mg
  Filled 2024-04-27: qty 2
  Filled 2024-04-27 (×2): qty 1
  Filled 2024-04-27 (×2): qty 2
  Filled 2024-04-27: qty 1
  Filled 2024-04-27 (×2): qty 2
  Filled 2024-04-27 (×2): qty 1
  Filled 2024-04-27 (×2): qty 2

## 2024-04-27 MED ORDER — NAPHAZOLINE-GLYCERIN 0.012-0.25 % OP SOLN: 1.0000 [drp] | Freq: Four times a day (QID) | OPHTHALMIC | Status: DC | PRN

## 2024-04-27 MED ORDER — ACETAMINOPHEN 650 MG RE SUPP: 650.0000 mg | Freq: Four times a day (QID) | RECTAL | Status: DC | PRN

## 2024-04-27 MED ORDER — JUVEN PO PACK
1.0000 | PACK | Freq: Two times a day (BID) | ORAL | Status: DC
  Administered 2024-04-27 – 2024-05-07 (×19): 1
  Filled 2024-04-27 (×21): qty 1

## 2024-04-27 MED ORDER — ACETAMINOPHEN 325 MG PO TABS
325.0000 mg | ORAL_TABLET | Freq: Four times a day (QID) | ORAL | Status: DC | PRN
  Administered 2024-05-06 – 2024-05-07 (×2): 650 mg
  Filled 2024-04-27 (×2): qty 2

## 2024-04-27 MED ORDER — SALINE SPRAY 0.65 % NA SOLN
1.0000 | Freq: Four times a day (QID) | NASAL | Status: DC | PRN
  Administered 2024-05-06: 2 via NASAL
  Filled 2024-04-27: qty 44

## 2024-04-27 MED ORDER — ALBUMIN HUMAN 25 % IV SOLN: 25.0000 g | INTRAVENOUS | Status: DC | PRN

## 2024-04-27 MED ORDER — MENTHOL 3 MG MT LOZG: 1.0000 | LOZENGE | OROMUCOSAL | Status: DC | PRN

## 2024-04-27 MED ORDER — PHENOL 1.4 % MT LIQD: 2.0000 | OROMUCOSAL | Status: DC | PRN

## 2024-04-27 MED ORDER — VITAL 1.5 CAL PO LIQD: 1000.0000 mL | ORAL | Status: DC

## 2024-04-27 MED ORDER — SEVELAMER CARBONATE 2.4 G PO PACK
2.4000 g | PACK | Freq: Two times a day (BID) | ORAL | Status: DC
  Administered 2024-04-27 – 2024-04-28 (×3): 2.4 g
  Filled 2024-04-27 (×5): qty 1

## 2024-04-27 MED ORDER — HYDROCORTISONE SOD SUC (PF) 100 MG IJ SOLR
50.0000 mg | Freq: Every day | INTRAMUSCULAR | Status: DC
  Administered 2024-04-28: 50 mg via INTRAVENOUS
  Filled 2024-04-27: qty 2

## 2024-04-27 MED ORDER — VITAL 1.5 CAL PO LIQD
1000.0000 mL | ORAL | Status: AC
  Administered 2024-04-27: 1000 mL

## 2024-04-27 NOTE — Progress Notes (Signed)
 NAME:  Kristopher Thompson, MRN:  [id number], DOB:  12-05-1950, LOS: 17 ADMISSION DATE:  04/10/2024, CONSULTATION DATE:  04/13/2024 REFERRING MD:  Dr Bonita Bussing, CHIEF COMPLAINT: Sepsis  History of Present Illness:  S/p repair of incisional/parastomal and ventral hernias, left inguinal incarcerated , abdominal hernias, lysis of adhesions, urostomy revision Day 1 postoperatively  Transferred to the ICU secondary to tachycardia, tachypnea  History of bladder cancer, prostate cancer, diastolic heart failure, diabetes, right bundle branch block, multiple abdominal surgeries  Update 04/20/24: ccm reconsulted post operatively after pt had perforated bowel and returned to OR. Pt is requiring neo and norepi at this time. He remains intubated at this time. Profoundly acidotic with bicarb of 12. Will start sodium bicarb infusion and bolus prior to initiation. Follow labs in am. Updated wife and son at bedside.   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   moderate atypia - right low back   Atypical nevus 04/04/2007   moderate to marked - right upper back (wider shave)   Atypical nevus 04/04/2007   moderate atypia - center chest (wider shave)   Atypical nevus 04/04/2007   slight atypia - right thigh   Atypical nevus 11/29/2011   mild atypia - center upper back   Atypical nevus 11/29/2011   mild atypia - center chest   Bacteremia due to Klebsiella pneumoniae 10/09/2017   Bladder cancer (HCC) dx 07/2017   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 denies   Sleep apnea    cpap   Squamous cell carcinoma of skin 05/22/2013   left cheek - CX3 + 5FU   Wears glasses    Significant Hospital Events: Including procedures, antibiotic start and stop dates in addition to other pertinent events   04/10/2024-laparoscopic surgery 04/13/24 CCM consult. Incr NE. Art line  6/3 weaning NE. Gently diuresed w 3L off  6/4 off NE. Pulled out his NGT refused replacement. After multiple emesis  6/5 cont NGT 6/6 tx to med surg 6/9: taken back to OR for perforated bowel, post-op shock 6/10: Norepinephrine , Phenylephrine , and vasopressin , added steroids, 1u PRBCs, back to OR for washout and closure 6/11: Worsening AKI with fluid overload, start CRRT, decreasing pressor requirements 6/13 tolerating CRRT 6/14: Off CRRT, arousable.  Tolerated weaning for most of the day 6/13 6/14: Extubated, CRRT stopped. 6/15 transferred to [facility] for iHD; montoring for renal recovery  Interim History / Subjective:   This morning he is confused and has not been talking. He is still on TPN. His wife is at bedside.   Objective    Blood pressure 127/60, pulse 99, temperature 98.3 F (36.8 C), resp. rate 19, height 5' 11 (1.803 m), weight 111.8 kg, SpO2 100%.  CVP:  [8 mmHg-10 mmHg] 10 mmHg      Intake/Output Summary (Last 24 hours) at 04/27/2024 0824 Last data filed at 04/27/2024 0800 Gross per 24 hour  Intake 2965.46 ml  Output 4595 ml  Net -1629.54 ml   Filed Weights   04/25/24 0442 04/26/24 0409 04/27/24 0500  Weight: 117.1 kg 113.8 kg 111.8 kg    Examination: General: chronically ill appearing man lying in bed in NAD neuro: confused, not following commands or talking. Tracking some, but does not appear to comprehend what his wife and I are saying.   HEENT: [place]/AT, eyes anicteric RESP: breathing comfortably on [place], CTAB, occasional coughing CV: S1S2, RRR GI: soft, NT. Midline wound vac, ostomy draining bilious stool. LUQ purulent bloody drainage from drain. Ileostomy with clear yellow urine output. GU: urostomy with clear yellow urine Skin: bruising Extremities: +++edema especially in upper extremities   BUN 142 Cr 3.99 Phos 7.3 WBC 20.7 H/H 8.8/27.7 Platelets 373   6/10 operative culture: E Coli (R to amp & amp-sulbactam, otherwise sensitive), E faecalis (pan-sensitive)  Resolved problem list    Assessment and Plan   Robotic hernia repair-- incisional, parastomal, left inguinal Septic shock due to acute bowel perforation - OR 6/9, back to the OR 6/10 for closure and ileal resection and end ileostomy creation - con't prolonged course of antibiotics per surgery- con't mica & zosyn  -TPN to be weaned off, con't enteral TF. Too lethargic today for diet-- hopefully better after HD.  -tapering off steroids-- decrease to 50mg  daily now -remains off pressors -appreciate surgery's management  Acute hypoxemic respiratory failure - Extubated 6/14 and tolerating okay -prioritize pulmonary hygiene -wean O2 as able  Acute metabolic encephalopathy due to renal failure, uremia -iHD today -avoid sedating meds  Acute kidney injury Acute metabolic acidosis due to renal failure hyperphosphatemia -strict I/O -monitor UOP from ostomy -iHD today -renally dose meds, avoid nephrotoxic meds -adding renvela BID for a few days; can reassess if he is still needing dialysis  Hypertension -con't amlodipine and metoprolol   History of diabetes -SSI PRN -decrease glargine to 25 units daily with decrease in steroids and stopping TPN tonight.  Anemia Right flank hematoma History of PE, was on Eliquis at home -heparin ; pharmacy to dose -no plans to restart eliquis until we are less likely to need ICU procedures   History of  HFpEF -volume management with HD, monitor UOP  Moderate protein calorie malnutrition -con't TF at goal, stopping TPN tonight  Wife updated at bedside during rounds.    Best Practice (right click and Reselect all SmartList Selections daily)   Diet/type: tubefeeds ,  tpn DVT prophylaxis systemic heparin   Pressure ulcer(s): N/A GI prophylaxis: H2B Lines: Central line, PICC art line  Foley:  N/A urostomy  Code Status:  full code Last date of multidisciplinary goals of care discussion [spouse updated daily]   This patient is critically ill with multiple organ system failure which requires frequent high complexity decision making, assessment, support, evaluation, and titration of therapies. This was completed through the application of advanced monitoring technologies and extensive interpretation of multiple databases. During this encounter critical care time was devoted to patient care services described in this note for 47 minutes.  Joesph Mussel, DO 04/27/24 10:00 AM [facility]  For contact information, see Amion. If no response to pager, please call PCCM consult pager. After hours, 7PM- 7AM, please call Elink.

## 2024-04-27 NOTE — Consult Note (Signed)
 WOC Nurse wound follow up Wound type:midline surgical wound with NPWT  Measurement:21 cm x 4 cm x 4 cm 2 areas open in mid wound and distal ends.  Mid wound 3 cm depth, distal end 6.5 cm  Wound XBM:WUXLK red Drainage (amount, consistency, odor) moderate serosanguinous no odor Periwound:RUQ ileostomy  RLQ ileal conduit Dressing procedure/placement/frequency:3 pieces black foam.  Covered with drape.  Seal achieved at 125 mmHg.  Change Monday/Wednesday/Friday.  WOC Nurse ostomy follow up Stoma type/location: RLQ ileal conduit RUQ ileostomy Stomal assessment/size: IC:  1 cm round , pink and flush.  Producing clear yellow urine Ileostomy:  Oval, pink moist, slightly budded Peristomal assessment: IC is in valley circumferentially, used barrier ring and convex pouch  Treatment options for stomal/peristomal skin: Barrier ring and convex pouch with both stomas Output clear yellow urine Liquid green stool from ileostomy  Ostomy pouching: 1pc. Convex with barrier ring for both stomas.  Education provided: Wife at bedside, observed pouch changes.   Enrolled patient in [place] Secure Start Discharge program: NO Will follow.  Branda Cain MSN, RN, FNP-BC CWON Wound, Ostomy, Continence Nurse Outpatient [facility] [phone] Pager [phone]

## 2024-04-27 NOTE — TOC Progression Note (Signed)
 Transition of Care [facility]) - Progression Note    Patient Details  Name: Kristopher Thompson MRN: [id number] Date of Birth: 1951-10-26  Transition of Care [facility]) CM/SW Contact  Juliane Och, LCSW Phone Number: 04/27/2024, 12:26 PM  Clinical Narrative:     12:26 PM CSW informed CIR admission that patient has been extubated and is disoriented x3. CIR stated that they would follow patient's participation and progress with therapies.  Expected Discharge Plan: IP Rehab Facility Barriers to Discharge: Continued Medical Work up  Expected Discharge Plan and Services   Discharge Planning Services: CM Consult   Living arrangements for the past 2 months: Single Family Home                                       Social Determinants of Health (SDOH) Interventions SDOH Screenings   Food Insecurity: No Food Insecurity (04/12/2024)  Housing: Low Risk  (04/12/2024)  Transportation Needs: No Transportation Needs (04/12/2024)  Utilities: Not At Risk (04/12/2024)  Social Connections: Socially Integrated (04/10/2024)  Tobacco Use: Medium Risk (04/10/2024)    Readmission Risk Interventions    04/12/2024    4:58 PM  Readmission Risk Prevention Plan  Transportation Screening Complete  PCP or Specialist Appt within 3-5 Days Complete  HRI or Home Care Consult Complete  Social Work Consult for Recovery Care Planning/Counseling Complete  Palliative Care Screening Not Applicable  Medication Review Oceanographer) Complete

## 2024-04-27 NOTE — Procedures (Signed)
 Cortrak  Person Inserting Tube:  Brenita Callow, Jobina Maita L, RD Tube Type:  Cortrak - 43 inches Tube Size:  10 Tube Location:  Left nare Secured by: Bridle Technique Used to Measure Tube Placement:  Marking at nare/corner of mouth Cortrak Secured At:  60 cm   Cortrak Tube Team Note:  Consult received to place a Cortrak feeding tube.   X-ray is required. RN may begin using tube once placement is confirmed by x-ray.   If the tube becomes dislodged please keep the tube and contact the Cortrak team at [email or web address] for replacement.  If after hours and replacement cannot be delayed, place a NG tube and confirm placement with an abdominal x-ray.    Doneta Furbish RD, LDN Clinical Dietitian

## 2024-04-27 NOTE — Progress Notes (Signed)
 eLink Physician-Brief Progress Note Patient Name: Kristopher Thompson DOB: December 23, 1950 MRN: [id number]   Date of Service  04/27/2024  HPI/Events of Note  Question about giving PM meds, BP 107/37  eICU Interventions  Add holding parameters on antihypertensives     Intervention Category Minor Interventions: Routine modifications to care plan (e.g. PRN medications for pain, fever)  Monicia Tse 04/27/2024, 9:40 PM

## 2024-04-27 NOTE — Plan of Care (Signed)
  Problem: Clinical Measurements: Goal: Ability to maintain clinical measurements within normal limits will improve Outcome: Progressing   Problem: Skin Integrity: Goal: Risk for impaired skin integrity will decrease Outcome: Progressing  Patient repositioned every 2 hours to offload bony prominences

## 2024-04-27 NOTE — Progress Notes (Addendum)
 PHARMACY - TOTAL PARENTERAL NUTRITION CONSULT NOTE   Indication: Prolonged ileus  Patient Measurements: Height: 5' 11 (180.3 cm) Weight: 111.8 kg (246 lb 7.6 oz) IBW/kg (Calculated) : 75.3 TPN AdjBW (KG): 85.6 Body mass index is 34.38 kg/m.  Assessment: Pt is a 79 yoM who underwent hernia repair and urostomy revision on 04/10/24. Started TPN 6/2 for post-op ileus. Return to OR early 6/9 for perforated bowel s/p drainage of abscesses, small bowel resection (in discontinuity) with placement of wound VAC. He returned to the ICU intubated and requiring vasopressor support. On 6/10, patient returned to OR and is now s/p abdominal wall debridement, ileal resection, end ileostomy, hernia repairs and fascia closure w/ placement of wound vac. He was started on trickle feeds, now at goal.   Glucose / Insulin : Hx DM on metformin PTA. A1c 5.3%, CBG 128-183, semglee  50u daily, 5u aspart Q4H, no insulin  in TPN, required 51u SSI / 24h, has been on HC 50 Q12H since 6/12 Electrolytes: K: 4.0, CoCa: 9.9, phos: 7.3, mag: 2.6  Renal: 6/14 CRRT off, now iHD, BUN: 142, Scr: 3.99  Hepatic: albumin  1.8; LFTs WNL, TG: 195 (6/13)  Intake / Output; MIVF: UOP: - , - drain, -650 ostomy,  GI Imaging: -6/9: Air-fluid collection in the subcutaneous soft tissues of the right anterior pelvic wall extending from the midline to the ileostomy -6/3: Right lower quadrant small bowel loop with mural defect and adjacent extraluminal gas identified, which is concerning for underlying small bowel perforation. GI Surgeries / Procedures:  -6/10: Abdominal wall debridement, ileal resection, end ileostomy, hernia repairs, fascia closure w/ placement of wound vac -6/9: Perforated bowel - drainage of right abdominal wall abscess as well as intra-abdominal abscess, explantation of abdominal mesh, small bowel resection (in discontinuity), placement of wound VAC -5/30: Hernia repair, lysis of adhesions, urostomy revision  Central  access: PICC TPN start date: 6/2   Nutritional Goals: - Goal TPN rate is 90 mL/hr (provides 101.5 g of protein and 2047 kcals per day)    RD Assessment: Estimated Needs Total Energy Estimated Needs: 2100-2300 kcals Total Protein Estimated Needs: 155-180 grams Total Fluid Estimated Needs: >/= 2.1L  Current Nutrition:  Dysphagia 1  Vital High Protein @ goal for past 24 hours, 55cc/hour, MD changed to cyclic this AM   Plan: TPN has been at half goal rate x2 days. (50cc/hr on 6/14, 40cc/hr on 6/15).  Spoke with surgery, will not re-order TPN for 6/16.  Will discuss empiric insulin  reductions with CCM.   Mamie Searles, PharmD, BCCCP  Clinical Pharmacist 04/27/2024 8:36 AM

## 2024-04-27 NOTE — Evaluation (Signed)
 Clinical/Bedside Swallow Evaluation Patient Details  Name: Kristopher Thompson MRN: [id number] Date of Birth: 08/06/1951  Today's Date: 04/27/2024 Time: SLP Start Time (ACUTE ONLY): 1613 SLP Stop Time (ACUTE ONLY): 1633 SLP Time Calculation (min) (ACUTE ONLY): 20 min  Past Medical History:  Past Medical History:  Diagnosis Date   At risk for sleep apnea    12-25-2017   STOP-BANG SCORE= 5   --- SENT TO PCP   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   moderate atypia - right low back   Atypical nevus 04/04/2007   moderate to marked - right upper back (wider shave)   Atypical nevus 04/04/2007   moderate atypia - center chest (wider shave)   Atypical nevus 04/04/2007   slight atypia - right thigh   Atypical nevus 11/29/2011   mild atypia - center upper back   Atypical nevus 11/29/2011   mild atypia - center chest   Bacteremia due to Klebsiella pneumoniae 10/09/2017   Bladder cancer (HCC) dx 07/2017   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  s/p  cystectomy w/ ileal conduit urinary diversion   Candida infection    CHF (congestive heart failure) (HCC)    Colostomy in place (HCC)    since 08-08-2017-- per pt 12-25-2017 reddness around stoma   Diabetes mellitus without complication (HCC)    GERD (gastroesophageal reflux disease)    H/O hiatal hernia    History of sepsis 09/2017   dx bacteremia due to klebsiella pneumoniae,  post op intraabdominal abscess   Prostate cancer [facility]) urologist-- dr Rozanne Corners   10-02-2012 s/p  prostatectomy-- Stage T1c   RBBB    Renal disorder    pt. denies   Sleep apnea    cpap   Squamous cell carcinoma of skin 05/22/2013   left cheek - CX3 + 5FU   Wears glasses    Past Surgical History:  Past Surgical History:  Procedure Laterality Date   ABDOMINAL SURGERY     APPENDECTOMY  1972   BOWEL RESECTION N/A 04/20/2024   Procedure: SMALL BOWEL RESECTION;  Surgeon: Aldean Hummingbird, MD;  Location: [facility];  Service: General;  Laterality: N/A;   CHOLECYSTECTOMY  1985    COLONOSCOPY N/A 06/29/2021   Procedure: COLONOSCOPY;  Surgeon: Joyce Nixon, MD;  Location: [facility];  Service: Endoscopy;  Laterality: N/A;   COLOSTOMY REVERSAL N/A 01/08/2018   Procedure: COLOSTOMY REVERSAL;  Surgeon: Joyce Nixon, MD;  Location: [facility];  Service: General;  Laterality: N/A;   CYSTOSCOPY WITH RETROGRADE PYELOGRAM, URETEROSCOPY AND STENT PLACEMENT Right 06/10/2017   Procedure: CYSTOSCOPY WITH RIGHT URETEROSCOPY WITH RIGHT STENT PLACEMENT;  Surgeon: Florencio Hunting, MD;  Location: [facility];  Service: Urology;  Laterality: Right;   EUS N/A 04/16/2018   Procedure: FULL UPPER ENDOSCOPIC ULTRASOUND (EUS) RADIAL;  Surgeon: Evangeline Hilts, MD;  Location: [facility];  Service: Endoscopy;  Laterality: N/A;   FLEXIBLE SIGMOIDOSCOPY N/A 12/13/2017   Procedure: FLEXIBLE SIGMOIDOSCOPY;  Surgeon: Joyce Nixon, MD;  Location: [facility];  Service: Endoscopy;  Laterality: N/A;   ILEO CONDUIT     IR CATHETER TUBE CHANGE  12/13/2017   IR CATHETER TUBE CHANGE  01/17/2018   IR CATHETER TUBE CHANGE  02/28/2018   IR CATHETER TUBE CHANGE  07/18/2018   IR CATHETER TUBE CHANGE  08/22/2018   IR CATHETER TUBE CHANGE  10/31/2018   IR CONVERT LEFT NEPHROSTOMY TO NEPHROURETERAL CATH  10/24/2017   IR EXT NEPHROURETERAL CATH EXCHANGE  05/19/2018   IR EXT NEPHROURETERAL CATH EXCHANGE  06/16/2018   IR NEPHRO  TUBE REMOV/FL  10/24/2017   IR NEPHROSTOGRAM LEFT THRU EXISTING ACCESS  12/05/2018   IR NEPHROSTOMY PLACEMENT LEFT  10/07/2017   IR NEPHROSTOMY TUBE CHANGE  04/11/2018   LAPAROTOMY N/A 04/20/2024   Procedure: EXPLORATORY LAPAROTOMY;  Surgeon: Aldean Hummingbird, MD;  Location: [facility];  Service: General;  Laterality: N/A;   PARTIAL COLECTOMY N/A 04/21/2024   Procedure: COLECTOMY, PARTIAL;  Surgeon: Candyce Champagne, MD;  Location: [facility];  Service: General;  Laterality: N/A;  Removal Wound Vac, Washout Ostomy, Possible Anastomosis, Possible Ileostomy.  Phasix Mesh.   POLYPECTOMY  06/29/2021   Procedure: POLYPECTOMY;   Surgeon: Joyce Nixon, MD;  Location: [facility];  Service: Endoscopy;;   ROBOT ASSISTED LAPAROSCOPIC RADICAL PROSTATECTOMY  10/02/2012   Procedure: ROBOTIC ASSISTED LAPAROSCOPIC RADICAL PROSTATECTOMY LEVEL 2;  Surgeon: [name], MD;  Location: [facility];  Service: Urology;  Laterality: N/A;   ROBOTIC ASSISTED LAPAROSCOPIC BLADDER DIVERTICULECTOMY N/A 08/08/2017   Procedure: XI ROBOTIC ASSISTED LAPAROSCOPIC RADICAL CYSTECTOMY COVERTED TO OPEN PELVIC LYMPHADNECTOMY BILATERAL AND ILEAL CONDUIT URINARY DIVERSION;  Surgeon: Florencio Hunting, MD;  Location: [facility];  Service: Urology;  Laterality: N/A;   TRANSURETHRAL RESECTION OF BLADDER TUMOR  06/10/2017   Procedure: TRANSURETHRAL RESECTION OF BLADDER TUMOR (TURBT);  Surgeon: Florencio Hunting, MD;  Location: [facility];  Service: Urology;;   VACUUM ASSISTED CLOSURE CHANGE N/A 04/20/2024   Procedure: PLACEMENT OF Gabriel John;  Surgeon: Aldean Hummingbird, MD;  Location: [facility];  Service: General;  Laterality: N/A;   VENTRAL HERNIA REPAIR N/A 04/10/2024   Procedure: REPAIR, HERNIA, VENTRAL;  Surgeon: Candyce Champagne, MD;  Location: [facility];  Service: General;  Laterality: N/A;   XI ROBOT ABDOMINAL PERINEAL RESECTION N/A 08/08/2017   Procedure: REPAIR OF RECTAL TEAR POSSIBLE PARTIAL PROCTECTOMY, CREATION OF  OSTOMY;  Surgeon: Joyce Nixon, MD;  Location: [facility];  Service: General;  Laterality: N/A;   XI ROBOTIC ASSISTED PARASTOMAL HERNIA REPAIR N/A 04/10/2024   Procedure: REPAIR, HERNIA, PARASTOMAL AND VENTRAL HERNIAS, ROBOT-ASSISTED, LEFT INGUINAL HERNIA, LYSIS OF ADHESIONS INCARCERATED AND INSIONAL HERNIAS AND UROSTOMY REVISION;  Surgeon: Candyce Champagne, MD;  Location: [facility];  Service: General;  Laterality: N/A;   HPI:  Kristopher Thompson is a 73 yo male s/p repair of incisional, parastomal, and L inguinal incarcerated abdominal hernias on 5/30. SLP assessed 6/7 with recommendations to initiate Dys 2 diet with thin liquids as well as aspiration and esophageal precautions.  Found to have bowel perf s/p OR repair 6/9, remained intubated after the procedure 6/9-6/14. Worsening AKI 6/11 with fluid overload, CRRT initiated. PMH includes CHF, bladder cancer, prostate cancer, DM, renal disorder, RBBB, multiple abdominal surgeries, GERD    Assessment / Plan / Recommendation  Clinical Impression  Pt presents with lethargy and difficulty sustaining attention to functional tasks. He was alert but not responsive to questions and did not follow commands. Congested coughing was noted at baseline but pt is unable to expectorate secretions. Oral care was provided followed by trials of ice chips, thin liquids, and purees. All trials resulted in impaired oral transit, multiple swallows, wet vocal quality, and immediate coughing. Although moisture may help loosen secretions, pt remained unable to mobilize them sufficiently for expectoration. Diet order was previously placed per MD but feel his mentation is currently a significant barrier to assessment and safe PO intake.  Discussed with RN, CCM, and SGY MD. Recommend he be made NPO except for ice chips after oral care. Large bore NGT was replaced with a Cortrak this date for med administration.  SLP will continue following. SLP Visit Diagnosis: Dysphagia, unspecified (R13.10)    Aspiration Risk  Moderate aspiration risk    Diet Recommendation NPO;Ice chips PRN after oral care    Medication Administration: Via alternative means    Other  Recommendations Oral Care Recommendations: Oral care QID;Oral care prior to ice chip/H20     Assistance Recommended at Discharge    Functional Status Assessment Patient has had a recent decline in their functional status and demonstrates the ability to make significant improvements in function in a reasonable and predictable amount of time.  Frequency and Duration min 2x/week  2 weeks       Prognosis Prognosis for improved oropharyngeal function: Good Barriers to Reach Goals: Cognitive  deficits;Time post onset      Swallow Study   General HPI: Kristopher Thompson is a 73 yo male s/p repair of incisional, parastomal, and L inguinal incarcerated abdominal hernias on 5/30. SLP assessed 6/7 with recommendations to initiate Dys 2 diet with thin liquids as well as aspiration and esophageal precautions. Found to have bowel perf s/p OR repair 6/9, remained intubated after the procedure 6/9-6/14. Worsening AKI 6/11 with fluid overload, CRRT initiated. PMH includes CHF, bladder cancer, prostate cancer, DM, renal disorder, RBBB, multiple abdominal surgeries, GERD Type of Study: Bedside Swallow Evaluation Previous Swallow Assessment: see HPI Diet Prior to this Study: NPO;Cortrak/Small bore NG tube Temperature Spikes Noted: No Respiratory Status: Room air History of Recent Intubation: Yes Total duration of intubation (days): 6 days Date extubated: 04/25/24 Behavior/Cognition: Lethargic/Drowsy;Requires cueing;Cooperative Oral Cavity Assessment: Within Functional Limits Oral Care Completed by SLP: Yes Oral Cavity - Dentition: Adequate natural dentition Vision: Functional for self-feeding Self-Feeding Abilities: Total assist Patient Positioning: Upright in bed Baseline Vocal Quality: Not observed Volitional Cough: Congested Volitional Swallow: Unable to elicit    Oral/Motor/Sensory Function Overall Oral Motor/Sensory Function: Within functional limits   Ice Chips Ice chips: Impaired Presentation: Spoon Oral Phase Impairments: Impaired mastication Pharyngeal Phase Impairments: Multiple swallows;Wet Vocal Quality;Cough - Immediate   Thin Liquid Thin Liquid: Impaired Presentation: Spoon Pharyngeal  Phase Impairments: Multiple swallows;Wet Vocal Quality;Cough - Immediate    Nectar Thick Nectar Thick Liquid: Not tested   Honey Thick Honey Thick Liquid: Not tested   Puree Puree: Impaired Presentation: Spoon Oral Phase Impairments: Reduced lingual movement/coordination Pharyngeal  Phase Impairments: Wet Vocal Quality;Cough - Immediate   Solid     Solid: Not tested      Amil Kale, M.A., CCC-SLP Speech Language Pathology, Acute Rehabilitation Services  Secure Chat preferred [phone]  04/27/2024,5:01 PM

## 2024-04-27 NOTE — Progress Notes (Signed)
 [facility] NEPHROLOGY PROGRESS NOTE  Assessment/ Plan: Pt is a 73 y.o. yo male  with past medical history significant for hypertension, DM, CAD, bladder cancer, CHF, right bundle branch block who had acute bowel perforation and status post OR for bowel repair on 6/8, lysis of adhesion, ileal resection end ileostomy hernia repairs, septic shock seen as a consultation for the evaluation and management of AKI and oliguria.   # Acute kidney injury, oliguric with anasarca likely ischemic ATN due to septic shock and obstructive uropathy given bilateral hydronephrosis.  CRRT 6/11-6/14 now off monitoring for recovery.  UOP great now but clearance lagging.  BUN remains ~100.  In light of decreased mental status, he was moved to [facility] with plans for dialysis today. - Dialysis today - Continue to monitor for renal recovery  # Bilateral hydronephrosis severe on the left side: S/p revision of ileal conduit 5/30,  urology following.    # Acute bowel perforation is status post bowel repair ileal conduit and hernia repairs urostomy ileal conduit revision.  As per surgical team.    # HTN: BB and amlodipine added today, likely volume contributing, net + 7.4L, one time lasix  today. UF 1.5L with HD tomorrow. Avoid hypotension.   # Acute hypoxic respiratory failure/VDRF per pulmonary team. Extubated 6/14, currently on nasal cannula   Will follow.   Subjective: Patient minimally interactive.  Great urine output with 3.5 L made yesterday.  No complaints  Objective Vital signs in last 24 hours: Vitals:   04/27/24 0800 04/27/24 1011 04/27/24 1012 04/27/24 1020  BP:   (!) 168/57 (!) 161/58  Pulse:      Resp:      Temp: 98.3 F (36.8 C)  98.2 F (36.8 C)   TempSrc:   Axillary   SpO2:      Weight:  111.6 kg    Height:       Weight change: -2 kg  Intake/Output Summary (Last 24 hours) at 04/27/2024 1056 Last data filed at 04/27/2024 0800 Gross per 24 hour  Intake 2430.23 ml  Output  3545 ml  Net -1114.77 ml       Labs: RENAL PANEL Recent Labs  Lab 04/24/24 0338 04/24/24 1600 04/25/24 0508 04/25/24 0618 04/26/24 0507 04/26/24 0644 04/26/24 0716 04/27/24 0453  NA 133* 136  --  135  136 131* 138  --  141  141  K 3.6 3.8  --  3.6  3.6 3.7 3.8  --  4.0  4.0  CL 97* 101  --  104  104 101 106  --  108  108  CO2 25 24  --  22  23 21* 21*  --  18*  17*  GLUCOSE 154* 143*  --  142*  141* 620* 139*  --  183*  183*  BUN 59* 56*  --  54*  53* 100* 99*  --  142*  144*  CREATININE 1.85* 2.07*  --  1.77*  1.75* 3.19* 3.29*  --  3.99*  3.87*  CALCIUM  6.8* 7.2*  --  6.9*  7.0* 8.3* 7.9*  --  8.1*  8.4*  MG 2.4  --  2.8*  --  2.6*  --  2.6* 2.6*  PHOS 3.2 3.3  --  2.7 5.8*  --  5.9* 7.3*  7.3*  ALBUMIN  2.1* 2.1*  --  1.9* 1.9* 2.0*  --  1.8*  1.8*    Liver Function Tests: Recent Labs  Lab 04/23/24 0416 04/23/24 1600 04/26/24 0507  04/26/24 0644 04/27/24 0453  AST 21  --   --  25 20  ALT 13  --   --  33 29  ALKPHOS 46  --   --  58 58  BILITOT 0.7  --   --  0.9 0.6  PROT 4.9*  --   --  5.6* 5.5*  ALBUMIN  1.8*  1.7*   < > 1.9* 2.0* 1.8*  1.8*   < > = values in this interval not displayed.   No results for input(s): LIPASE, AMYLASE in the last 168 hours. No results for input(s): AMMONIA in the last 168 hours. CBC: Recent Labs    04/24/24 0338 04/25/24 0508 04/26/24 0507 04/26/24 0644 04/27/24 0453  HGB 7.5* 7.5* 8.8* 9.0* 8.8*  MCV 87.3 91.4 92.7 91.1 89.4    Cardiac Enzymes: No results for input(s): CKTOTAL, CKMB, CKMBINDEX, TROPONINI in the last 168 hours. CBG: Recent Labs  Lab 04/26/24 1940 04/26/24 2148 04/26/24 2350 04/27/24 0350 04/27/24 0807  GLUCAP 147* 128* 134* 163* 179*    Iron Studies: No results for input(s): IRON, TIBC, TRANSFERRIN, FERRITIN in the last 72 hours. Studies/Results: No results found.   Medications: Infusions:  sodium chloride  10 mL/hr at 04/27/24 0434   feeding  supplement (VITAL 1.5 CAL)     [START ON 04/28/2024] feeding supplement (VITAL 1.5 CAL)     heparin  1,900 Units/hr (04/27/24 1610)   micafungin  (MYCAMINE ) 150 mg in sodium chloride  0.9 % 100 mL IVPB 150 mg (04/27/24 0940)   piperacillin -tazobactam (ZOSYN )  IV 3.375 g (04/27/24 0501)   TPN ADULT (ION) 40 mL/hr at 04/27/24 0434    Scheduled Medications:  amLODipine  5 mg Per Tube Daily   Chlorhexidine  Gluconate Cloth  6 each Topical Q2200   Chlorhexidine  Gluconate Cloth  6 each Topical Q0600   feeding supplement (PROSource TF20)  60 mL Per Tube BID   [START ON 04/28/2024] hydrocortisone  sod succinate (SOLU-CORTEF ) inj  50 mg Intravenous Daily   insulin  aspart  0-20 Units Subcutaneous Q4H   insulin  aspart  5 Units Subcutaneous Q4H   [START ON 04/28/2024] insulin  glargine-yfgn  25 Units Subcutaneous Daily   metoprolol  tartrate  25 mg Per Tube BID   mouth rinse  15 mL Mouth Rinse 4 times per day   polycarbophil  625 mg Per Tube BID   sevelamer carbonate  2.4 g Per Tube BID    have reviewed scheduled and prn medications.  Physical Exam: General: extubated, sleepy but arousable, not answering my questions Heart:normal rate, no rub Lungs: Coarse breath sound bilateral.  Bilateral chest rise, no iwob Abdomen: Mild distention, no pain with palpation Extremities: 1+ diffuse edema present Dialysis Access: LIJ Temporary HD line c/d/i  Levorn Reason 04/27/2024,10:56 AM  LOS: 17 days

## 2024-04-27 NOTE — Progress Notes (Addendum)
 SLP Cancellation Note  Patient Details Name: Kristopher Thompson MRN: [id number] DOB: Feb 12, 1951   Cancelled treatment:       Reason Eval/Treat Not Completed: Patient at procedure or test/unavailable. Wound care team assessing pt and RN setting up for HD. Discussed with RN and will f/u as able to complete swallowing evaluation.    Amil Kale, M.A., CCC-SLP Speech Language Pathology, Acute Rehabilitation Services  Secure Chat preferred [phone]  04/27/2024, 9:52 AM

## 2024-04-27 NOTE — Progress Notes (Signed)
 Pt completed dialysis treatment and goal met. Albumin  25g IV given during HD d/t pt had BP 90/ 58 mmhg.   04/27/24 1342  Vitals  Temp 98.3 F (36.8 C)  Temp Source Axillary  BP (!) 115/52 (AL)  BP Method Automatic  Patient Position (if appropriate) Lying  During Treatment Monitoring  Intra-Hemodialysis Comments Tx completed  Post Treatment  Dialyzer Clearance Lightly streaked  Hemodialysis Intake (mL) 100 mL  Liters Processed 78  Fluid Removed (mL) 1500 mL  Tolerated HD Treatment Yes  Post-Hemodialysis Comments Pt goal met.  Hemodialysis Catheter Left Internal jugular Triple lumen Temporary (Non-Tunneled)  Placement Date/Time: 04/22/24 1123   Placed prior to admission: No  Time Out: Correct patient;Correct site;Correct procedure  Maximum sterile barrier precautions: Hand hygiene;Cap;Sterile probe cover;Mask;Sterile gown;Large sterile sheet;Sterile glove...  Site Condition No complications  Blue Lumen Status Heparin  locked  Red Lumen Status Heparin  locked  Catheter fill solution Heparin  1000 units/ml  Catheter fill volume (Arterial) 1.4 cc  Catheter fill volume (Venous) 1.4  Dressing Type Gauze/Drain sponge  Dressing Status Antimicrobial disc/dressing in place;Clean, Dry, Intact  Interventions New dressing  Drainage Description None  Dressing Change Due 04/29/24  Post treatment catheter status Capped and Clamped

## 2024-04-27 NOTE — Progress Notes (Signed)
 PHARMACY - ANTICOAGULATION CONSULT NOTE  Pharmacy Consult for Heparin  Indication: h/o PE  Allergies  Allergen Reactions   Demerol  [Meperidine ] Nausea And Vomiting and Other (See Comments)    SEVERE NAUSEA    Patient Measurements: Height: 5' 11 (180.3 cm) Weight: 111.8 kg (246 lb 7.6 oz) IBW/kg (Calculated) : 75.3 HEPARIN  DW (KG): 100.8  Vital Signs: Temp: 98.3 F (36.8 C) (06/16 0800) Temp Source: Axillary (06/16 0351) BP: 127/60 (06/16 0600) Pulse Rate: 99 (06/16 0600)  Labs: Recent Labs    04/26/24 0507 04/26/24 0508 04/26/24 0644 04/26/24 1404 04/26/24 2200 04/27/24 0453  HGB 8.8*  --  9.0*  --   --  8.8*  HCT 27.8*  --  28.6*  --   --  27.7*  PLT 335  --  345  --   --  373  HEPARINUNFRC  --    < >  --  0.38 0.49 0.46  CREATININE 3.19*  --  3.29*  --   --  3.99*  3.87*   < > = values in this interval not displayed.    Estimated Creatinine Clearance: 21.6 mL/min (A) (by C-G formula based on SCr of 3.99 mg/dL (H)).   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   moderate atypia - right low back   Atypical nevus 04/04/2007   moderate to marked - right upper back (wider shave)   Atypical nevus 04/04/2007   moderate atypia - center chest (wider shave)   Atypical nevus 04/04/2007   slight atypia - right thigh   Atypical nevus 11/29/2011   mild atypia - center upper back   Atypical nevus 11/29/2011   mild atypia - center chest   Bacteremia due to Klebsiella pneumoniae 10/09/2017   Bladder cancer (HCC) dx 07/2017   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  s/p  cystectomy w/ ileal conduit urinary diversion   Candida infection    CHF (congestive heart failure) (HCC)    Colostomy in place (HCC)    since 08-08-2017-- per pt 12-25-2017 reddness around stoma   Diabetes mellitus without complication (HCC)    GERD (gastroesophageal reflux  disease)    H/O hiatal hernia    History of sepsis 09/2017   dx bacteremia due to klebsiella pneumoniae,  post op intraabdominal abscess   Prostate cancer [facility]) urologist-- dr Rozanne Corners   10-02-2012 s/p  prostatectomy-- Stage T1c   RBBB    Renal disorder    pt. denies   Sleep apnea    cpap   Squamous cell carcinoma of skin 05/22/2013   left cheek - CX3 + 5FU   Wears glasses     Assessment:  Patient with prolonged hospitalization including post-op complications resulting in bowel perforation with open abdomen. Has been closed and transferred to [facility]. On Eliquis PTA for hx of PE. Has been on heparin  gtt since 6/6.   AM heparin  level therapeutic at 0.46, HgB 8.8, and PLTs 373. No issues noted.   Goal of Therapy:  Heparin  level 0.3-0.7 units/ml Monitor platelets by anticoagulation protocol: Yes   Plan:  Continue heparin  at 1900u/hr.  Monitor for s/sx of bleeding.  Monitor CBC and heparin  level daily.  F/u in next few days about ability to transition back to [facility].   Mamie Searles, PharmD, BCCCP  04/27/2024 9:41 AM

## 2024-04-27 NOTE — Progress Notes (Addendum)
 04/27/2024  Kristopher Thompson [id number] 06/27/1951  CARE TEAM: PCP: Dudley Ghee, MD  Outpatient Care Team: Patient Care Team: Dudley Ghee, MD as PCP - General (Internal Medicine) Devon Fogo, MD (Inactive) as Consulting Physician (Dermatology) Lavina Pou, MD as Referring Physician (Pulmonary Disease) Candyce Champagne, MD as Consulting Physician (General Surgery) Florencio Hunting, MD as Consulting Physician (Urology)  Inpatient Treatment Team: Treatment Team:  Candyce Champagne, MD Etter Hermann., MD Florencio Hunting, MD Pccm, Md, MD Clementine Cutting, MD Catheline Clos, RN Roberta Chin, RN Baron Border, MD   Problem List:   Principal Problem:   Incarcerated incisional hernia Active Problems:   S/P ileal conduit [facility])   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 08/08/2017   GERD (gastroesophageal reflux disease)   Obesity (BMI 35.0-39.9 without comorbidity)   Prolonged QT interval   Non-recurrent bilateral inguinal hernia without obstruction or gangrene   Sinus tachycardia   Tachypnea   Acute respiratory insufficiency, postoperative   Sepsis due to undetermined organism (HCC)   Lactic acidosis   Class 2 obesity   Chronic anticoagulation   Hearing loss   History of bladder cancer   Obstructive sleep apnea of adult   Parastomal hernia of ileal conduit   Partial small bowel obstruction (HCC)   Personal history of PE (pulmonary embolism)   04/10/2024  POST-OPERATIVE DIAGNOSIS:  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   PROCEDURE:   -ROBOTIC LYSIS OF ADHESIONS X 4 HOURS -COMPONENT SEPARATION - TRANSVERSUS ABDOMINIS REALEASE (TAR) BILATERAL -ROBOTIC REPAIRS OF  INCISIONAL, PARASTOMAL, LEFT INGUINAL  INCARCERATED ABDOMINAL WALL HERNIAS WITH MESH -UROSTOMY ILEAL CONDUIT REVISION -INTRAOPERATIVE ASSESSMENT OF TISSUE VASCULAR PERFUSION USING ICG (indocyanine green ) -IMMUNOFLUORESCENCE -TRANSVERSUS ABDOMINIS PLANE  (TAP) BLOCK - BILATERAL    SURGEON:  Eddye Goodie, MD  OR FINDINGS:  Patient had dense adhesions anterior abdominal wall.  Largest hernia was in the right lower quadrant around the ileal conduit urostomy.  16 x 12 cm region.  Incarcerated with over a foot of small bowel.  Midline next largest 9 x 8 cm incarcerated with numerous loops of small bowel.  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  Another direct space hernia in the left lower quadrant and indirect hernia on the left lower quadrant.  No obvious hernias on the right side.   Component separation's done on both sides to try and get the largest midline and parastomal hernias close down with running serrated try to fix absorbable suture.  Broad underlay repair with 30 x 35 cm mesh transverse  04/20/2024 POST-OPERATIVE DIAGNOSIS:  perforated bowel; history of robotic lysis of adhesions x 4 hours, component separation-transversus abdominis release bilateral, robotic repairs of incisional, parastomal, left inguinal incarcerated abdominal wall hernias with mesh; urostomy ileal conduit revision by Dr. [name] on May 30   PROCEDURE:  Drainage of right abdominal wall abscess as well as intra-abdominal abscess Explantation of abdominal mesh Small bowel resection (in discontinuity) Placement of ABThera wound VAC   SURGEON:  Aldean Hummingbird, MD  ASSISTANTS: Adalberto Acton, MD   04/21/2024  POST-OPERATIVE DIAGNOSIS:  DELAYED BOWEL PERFORATION WITH OPEN ABDOMEN   PROCEDURE:   LYSIS OF ADHESIONS X ABDOMINAL WALL DEBRIDEMENT ILEAL RESECTION END ILEOSTOMY ABDOMINAL WALL PARASTOMAL & INCISIONAL HERNIA REPAIRS WITH PHASIX MESH FASCIA CLOSURE WITH WOUND VAC PLACEMENT IN SQ   SURGEON:  Eddye Goodie, MD   ASSISTANT:  [name], MD   Ostomy:  Right upper quadrant:  End ileostomy Right lower quadrant: Urostomy ileal conduit   Drains: 19 Fr Blake drains x2   Right lower quadrant drain rests in the right  panniculus abdominal wall between the Phasix mesh and abdominal wall   Left lower quadrant drain rest in the anterior peritoneum between the mesh and the bowel with the tip resting in the right lower quadrant near the base of the ileal conduit and prior bowel resection  Assessment [facility] Stay = 17 days) 6 Days Post-Op    Critical  Plan:  Shock resolved.  Bowel function returned.    Nutrition: Bowel function returned.  Tolerating tube feeds at goal.  Start cycling since I anticipate his p.o. effort will be poor for weeks to come.  He is very malnourished and will need as much nutrition as he can get.  Do not know if it is reasonable to switch to Nepro or given the fact his renal failure is improving, do more standard 1.5 Vital  TPN for malnutrition.  Start weaning off  Dysphagia 1 diet.    Ask speech therapy to evaluate and see if he has any dysphagia issues.  I suspect it will be a challenge since he is sedated and has an NG tube in place.  Perhaps we can switch to a small feeding tube such as a Corpak if it is felt he can swallow and be compliant on taking pills.  Hold off on Gtube unless poor PO effort >14days  Infection due to delayed bowel perforation and abdominal wall infection/necrosis  Growing multiple organisms including Candida albicans, Enterobacter, and Enterococcus.    IV Zosyn /micafungin .  Anticipate prolonged antibiotics given the abdominal wall infection/necrosis and need for Phasix mesh.  Will consider ID consultation for planning.  Would keep on micafungin  for now unless ID feels that the albicans is sensitive to fluconazole  and safe to transition.  Change wound vac q MonWedFri the hopes for the subcutaneous tissues to recover.  If progressive necrosis or concerns may need to switch to packing.  Wound ostomy nurse NP in room   AKI in the setting of urostomy ileal conduit and shock.    Initially oliguric and now higher volume.    Intraperitoneal  surgical Blake fluid creatinine same as serum argues against leak.  Dr Rozanne Corners aware  Still with elevated uremia so plan for intermittent dialysis in the hopes that he will gradually recover.  Hence the transfer to [facility].  Follow mental status.  Most likely developing ICU psychosis/metabolic encephalopathy with all the stresses he has been through.  Defer to critical care about finding regimen that hopefully will sedate him.  Precedex  base may help him clear his chronic need for opiates during his shock episodes and vent.  CT of chest negative for any obvious pulmonary embolism or pneumonia.  Oxygen needs less.  Echocardiogram shows grade 1 diastolic dysfunction which I believe is his baseline.  Defer to critical care/internal medicine they feel further is needed aside for some monitor diuresis.  ABLA on top of anemia of chronic disease improved with transfusion.  Follow.  CCM wanting him back on heparin  drip to avoid any clotting events since his history of PE  -monitor electrolytes & replace as needed.  Keep K>4, Mg>2, Phos>3.    -Diabetes.  Sliding scale insulin .  Some hyperglycemia not surprising with TPN, stress steroids, and trophic tube feeds.  Agree with adjusting to resistant sliding scale 6/10.  Hopefully weaning off TPN will allow to be easier to manage - defer  to CCM  -VTE prophylaxis- SCDs.  Anticoagulation prophyllaxis SQ as appropriate.  Full anticoagulation heparin  drip given history of pulmonary embolism, CCM wished to be aggressive with hep gtt - follow Hgb & BP closely.    I updated the patient's status to the the patient's wife & ICU nurses at bedside & ICU nurse in room.  Recommendations were made.  Questions were answered.  They expressed understanding & appreciation.  -Disposition: He is going to be here a while       I reviewed nursing notes, last 24 h vitals and pain scores, last 48 h intake and output, last 24 h labs and trends, and last 24 h imaging  results.  I have reviewed this patient's available data, including medical history, events of note, test results, etc as part of my evaluation.   A significant portion of that time was spent in counseling. Care during the described time interval was provided by me.  This care required high  level of medical decision making.  04/27/2024    Subjective: (Chief complaint)  Nephrology transferred to [facility] anticipating of intermittent hemodialysis.  Patient on 20M unit.  Wife and ICU nurse and pharmacy in room.  Patient extubated.  Answering simple questions but not particularly talkative.  Denies much pain.  Off pressors.  Tube feeds at goal.  Objective:  Vital signs:  Vitals:   04/27/24 0351 04/27/24 0400 04/27/24 0500 04/27/24 0600  BP:  (!) 134/59  127/60  Pulse:  99  99  Resp:  20  19  Temp: 97.9 F (36.6 C)     TempSrc: Axillary     SpO2:  100%  100%  Weight:   111.8 kg   Height:        Last BM Date : 04/26/24  Intake/Output   Yesterday:  06/15 0701 - 06/16 0700 In: 2965.5 [I.V.:1336; NG/GT:1350.3; IV Piggyback:279.2] Out: 4395 [Urine:3500; Drains:245; Stool:650] This shift:  No intake/output data recorded.  Bowel function:  Flatus: YES  BM:  YES -small  Drains:  RLQ drain (rests between abdominal wall and Phasix mesh): Serous    LLQ drain (runs over bowel with tip down in RLQ pelvis near base of ileal conduit and site of bowel resection):  thinly serosanguinous   Wound vac (in SQ over closed fascia):  serosanguineous   Physical Exam:  General: Awake but not providing much eye contact.  Follows a few commands.  Answering simple questions.  Not agitated Eyes: PERRL, normal EOM.  Sclera clear.  No icterus Neuro: CN II-XII intact w/o focal sensory/motor deficits. Lymph: No head/neck/groin lymphadenopathy Psych:  No delerium/psychosis/paranoia.  No agitation HENT: Normocephalic, Mucus membranes moist.  No thrush.  ETT & NGT in place Neck: Supple,  No tracheal deviation.  No obvious thyromegaly Chest: No pain to chest wall compression.  Good respiratory excursion.   CV:  Pulses intact.  Regular rhythm.  1-2+ BUE/BLE edema MS:  No obvious deformity  Abdomen:  Obese but Soft.  Nondistended.  Wound vac in midline.    Flank ecchymosis bilateral posterior stable to slightly resolving.  Edema along right flank and panniculus with mild erythema stable to slightly improved.  RUQ: (End ileostomy): Molasses thick black effluent in bag low volume RLQ:  (Urostomy ileal conduit): pink with clear light yellow-colored urine  Ext:   No deformity.  Bilateral hand 2+ edema greater than lower extremity   No cyanosis Skin: No petechiae / purpurea.  No major sores.  Warm and dry  Results:   Cultures: Recent Results (from the past 720 hours)  Culture, blood (Routine X 2) w Reflex to ID Panel     Status: None   Collection Time: 04/13/24  7:13 AM   Specimen: BLOOD  Result Value Ref Range Status   Specimen Description   Final    BLOOD BLOOD LEFT ARM AEROBIC BOTTLE ONLY ANAEROBIC BOTTLE ONLY Performed at [facility], [address], [place], [place] [address]    Special Requests   Final    BOTTLES DRAWN AEROBIC AND ANAEROBIC Blood Culture results may not be optimal due to an inadequate volume of blood received in culture bottles Performed at [facility], [address], [place], [place] [address]    Culture   Final    NO GROWTH 5 DAYS Performed at [facility], [address], [place], [place] [address]    Report Status 04/18/2024 FINAL  Final  Culture, blood (Routine X 2) w Reflex to ID Panel     Status: None   Collection Time: 04/13/24  7:20 AM   Specimen: BLOOD  Result Value Ref Range Status   Specimen Description   Final    BLOOD BLOOD RIGHT ARM AEROBIC BOTTLE ONLY ANAEROBIC BOTTLE ONLY Performed at [facility], [address], [place], [place] [address]    Special  Requests   Final    BOTTLES DRAWN AEROBIC AND ANAEROBIC Blood Culture results may not be optimal due to an inadequate volume of blood received in culture bottles Performed at [facility], [address] [place], [place] [address]    Culture   Final    NO GROWTH 5 DAYS Performed at [facility], [address] [place], [place] [address]    Report Status 04/18/2024 FINAL  Final  Urine Culture     Status: None   Collection Time: 04/13/24 10:18 AM   Specimen: Urine, Clean Catch  Result Value Ref Range Status   Specimen Description   Final    URINE, CLEAN CATCH Performed at [facility], [address], [place], [place] [address]    Special Requests   Final    NONE Performed at [facility], [address] [place], [place] [address]    Culture   Final    NO GROWTH Performed at [facility], [address] [place], [place] [address]    Report Status 04/14/2024 FINAL  Final  MRSA Next Gen by PCR, Nasal     Status: None   Collection Time: 04/13/24 11:35 AM   Specimen: Nasal Mucosa; Nasal Swab  Result Value Ref Range Status   MRSA by PCR Next Gen NOT DETECTED NOT DETECTED Final    Comment: (NOTE) The GeneXpert MRSA Assay (FDA approved for NASAL specimens only), is one component of a comprehensive MRSA colonization surveillance program. It is not intended to diagnose MRSA infection nor to guide or monitor treatment for MRSA infections. Test performance is not FDA approved in patients less than 65 years old. Performed at [facility], [address] [place], [place] [address]   Culture, blood (Routine X 2) w Reflex to ID Panel     Status: None   Collection Time: 04/20/24  8:46 AM   Specimen: BLOOD RIGHT ARM  Result Value Ref Range Status   Specimen Description   Final    BLOOD RIGHT ARM Performed at [facility], [address], [place], [place] [address]    Special Requests    Final    BOTTLES DRAWN AEROBIC AND ANAEROBIC Blood Culture results may not be optimal due to an inadequate volume of blood received in culture bottles Performed at [facility], [address] [place], [place] [address]    Culture   Final    NO GROWTH 5 DAYS Performed at [facility], [address], [place], [place] [address]    Report Status 04/25/2024 FINAL  Final  Culture, blood (Routine X 2) w Reflex to ID Panel     Status: None   Collection Time: 04/20/24  8:49 AM   Specimen: BLOOD  Result Value Ref Range Status   Specimen Description   Final    BLOOD Performed at [facility], [address] [place], [place] [address]    Special Requests   Final    BOTTLES DRAWN AEROBIC AND ANAEROBIC Blood Culture results may not be optimal due to an inadequate volume of blood received in culture bottles Performed at [facility], [address] [place], [place] [address]    Culture   Final    NO GROWTH 5 DAYS Performed at [facility], [address] [place], [place] [address]    Report Status 04/25/2024 FINAL  Final  Culture, Respiratory w Gram Stain     Status: None   Collection Time: 04/20/24 12:03 PM   Specimen: Tracheal Aspirate; Respiratory  Result Value Ref Range Status   Specimen Description   Final    TRACHEAL ASPIRATE Performed at [facility], [address] [place], [place] [address]    Special Requests   Final    NONE Performed at [facility], [address] [place], [place] [address]    Gram Stain NO WBC SEEN RARE GRAM POSITIVE COCCI   Final   Culture   Final    RARE Normal respiratory flora-no Staph aureus or Pseudomonas seen Performed at [facility], [address], [place], [place] [address]    Report Status 04/23/2024 FINAL  Final  Aerobic/Anaerobic Culture w Gram Stain (surgical/deep wound)     Status: None   Collection Time: 04/21/24  2:23 PM    Specimen: Soft Tissue, Other  Result Value Ref Range Status   Specimen Description   Final    TISSUE INTRA ABDOMINAL NECROTIC TISSUE Performed at [facility], [address] [place], [place] [address]    Special Requests   Final    NONE Performed at [facility], [address] [place], [place] [address]    Gram Stain   Final    FEW WBC PRESENT,BOTH PMN AND MONONUCLEAR RARE GRAM POSITIVE COCCI IN PAIRS AND CHAINS RARE YEAST    Culture   Final    RARE ESCHERICHIA COLI FEW CANDIDA ALBICANS FEW ENTEROCOCCUS FAECALIS FEW BACTEROIDES SPECIES BETA LACTAMASE POSITIVE Performed at [facility], [address] [place], [place] [address]    Report Status 04/25/2024 FINAL  Final   Organism ID, Bacteria ESCHERICHIA COLI  Final   Organism ID, Bacteria ENTEROCOCCUS FAECALIS  Final      Susceptibility   Escherichia coli - MIC*    AMPICILLIN >=32 RESISTANT Resistant     CEFEPIME  <=0.12 SENSITIVE Sensitive     CEFTAZIDIME <=1 SENSITIVE Sensitive     CEFTRIAXONE  1 SENSITIVE Sensitive     CIPROFLOXACIN  <=0.25 SENSITIVE Sensitive  GENTAMICIN  <=1 SENSITIVE Sensitive     IMIPENEM <=0.25 SENSITIVE Sensitive     TRIMETH /SULFA  <=20 SENSITIVE Sensitive     AMPICILLIN/SULBACTAM >=32 RESISTANT Resistant     PIP/TAZO 8 SENSITIVE Sensitive ug/mL    * RARE ESCHERICHIA COLI   Enterococcus faecalis - MIC*    AMPICILLIN <=2 SENSITIVE Sensitive     VANCOMYCIN  1 SENSITIVE Sensitive     GENTAMICIN  SYNERGY SENSITIVE Sensitive     * FEW ENTEROCOCCUS FAECALIS    Labs: Results for orders placed or performed during the hospital encounter of 04/10/24 (from the past 48 hours)  Glucose, capillary     Status: Abnormal   Collection Time: 04/25/24  8:24 AM  Result Value Ref Range   Glucose-Capillary 159 (H) 70 - 99 mg/dL    Comment: Glucose reference range applies only to samples taken after fasting for at least 8 hours.  Glucose, capillary     Status: Abnormal    Collection Time: 04/25/24 11:33 AM  Result Value Ref Range   Glucose-Capillary 178 (H) 70 - 99 mg/dL    Comment: Glucose reference range applies only to samples taken after fasting for at least 8 hours.   Comment 1 Notify RN    Comment 2 Document in Chart   Glucose, capillary     Status: Abnormal   Collection Time: 04/25/24  4:10 PM  Result Value Ref Range   Glucose-Capillary 147 (H) 70 - 99 mg/dL    Comment: Glucose reference range applies only to samples taken after fasting for at least 8 hours.  Glucose, capillary     Status: Abnormal   Collection Time: 04/25/24  7:55 PM  Result Value Ref Range   Glucose-Capillary 148 (H) 70 - 99 mg/dL    Comment: Glucose reference range applies only to samples taken after fasting for at least 8 hours.  Glucose, capillary     Status: Abnormal   Collection Time: 04/26/24 12:18 AM  Result Value Ref Range   Glucose-Capillary 169 (H) 70 - 99 mg/dL    Comment: Glucose reference range applies only to samples taken after fasting for at least 8 hours.  Glucose, capillary     Status: Abnormal   Collection Time: 04/26/24  3:48 AM  Result Value Ref Range   Glucose-Capillary 163 (H) 70 - 99 mg/dL    Comment: Glucose reference range applies only to samples taken after fasting for at least 8 hours.  Renal function panel (daily at 0500)     Status: Abnormal   Collection Time: 04/26/24  5:07 AM  Result Value Ref Range   Sodium 131 (L) 135 - 145 mmol/L   Potassium 3.7 3.5 - 5.1 mmol/L   Chloride 101 98 - 111 mmol/L   CO2 21 (L) 22 - 32 mmol/L   Glucose, Bld 620 (HH) 70 - 99 mg/dL    Comment: REPEATED TO VERIFY CRITICAL RESULT CALLED TO, READ BACK BY AND VERIFIED WITH CONTRERAS, A. RN AT 04/26/2024 0617 BY JEREMY C Glucose reference range applies only to samples taken after fasting for at least 8 hours.    BUN 100 (H) 8 - 23 mg/dL    Comment: RESULT CONFIRMED BY MANUAL DILUTION   Creatinine, Ser 3.19 (H) 0.61 - 1.24 mg/dL   Calcium  8.3 (L) 8.9 - 10.3  mg/dL   Phosphorus 5.8 (H) 2.5 - 4.6 mg/dL   Albumin  1.9 (L) 3.5 - 5.0 g/dL   GFR, Estimated 20 (L) >60 mL/min    Comment: (NOTE) Calculated using the  CKD-EPI Creatinine Equation (2021)    Anion gap 9 5 - 15    Comment: Performed at [facility], [address] [place], [place] [address]  Magnesium      Status: Abnormal   Collection Time: 04/26/24  5:07 AM  Result Value Ref Range   Magnesium  2.6 (H) 1.7 - 2.4 mg/dL    Comment: Performed at [facility], [address] [place], [place] [address]  CBC     Status: Abnormal   Collection Time: 04/26/24  5:07 AM  Result Value Ref Range   WBC 21.2 (H) 4.0 - 10.5 K/uL   RBC 3.00 (L) 4.22 - 5.81 MIL/uL   Hemoglobin 8.8 (L) 13.0 - 17.0 g/dL   HCT 93.8 (L) 10.1 - 75.1 %   MCV 92.7 80.0 - 100.0 fL   MCH 29.3 26.0 - 34.0 pg   MCHC 31.7 30.0 - 36.0 g/dL   RDW 02.5 (H) 85.2 - 77.8 %   Platelets 335 150 - 400 K/uL   nRBC 0.2 0.0 - 0.2 %    Comment: Performed at [facility], [address] [place], [place] [address]  Heparin  level (unfractionated)     Status: Abnormal   Collection Time: 04/26/24  5:08 AM  Result Value Ref Range   Heparin  Unfractionated 0.21 (L) 0.30 - 0.70 IU/mL    Comment: (NOTE) The clinical reportable range upper limit is being lowered to >1.10 to align with the FDA approved guidance for the current laboratory assay.  If heparin  results are below expected values, and patient dosage has  been confirmed, suggest follow up testing of antithrombin III levels. Performed at [facility], [address] [place], [place] [address]   Comprehensive metabolic panel     Status: Abnormal   Collection Time: 04/26/24  6:44 AM  Result Value Ref Range   Sodium 138 135 - 145 mmol/L    Comment: DELTA CHECK NOTED   Potassium 3.8 3.5 - 5.1 mmol/L   Chloride 106 98 - 111 mmol/L   CO2 21 (L) 22 - 32 mmol/L   Glucose, Bld 139 (H) 70 - 99 mg/dL    Comment:  Glucose reference range applies only to samples taken after fasting for at least 8 hours.   BUN 99 (H) 8 - 23 mg/dL    Comment: RESULT CONFIRMED BY MANUAL DILUTION   Creatinine, Ser 3.29 (H) 0.61 - 1.24 mg/dL   Calcium  7.9 (L) 8.9 - 10.3 mg/dL   Total Protein 5.6 (L) 6.5 - 8.1 g/dL   Albumin  2.0 (L) 3.5 - 5.0 g/dL   AST 25 15 - 41 U/L   ALT 33 0 - 44 U/L   Alkaline Phosphatase 58 38 - 126 U/L   Total Bilirubin 0.9 0.0 - 1.2 mg/dL   GFR, Estimated 19 (L) >60 mL/min    Comment: (NOTE) Calculated using the CKD-EPI Creatinine Equation (2021)    Anion gap 11 5 - 15    Comment: Performed at [facility], [address] [place], [place] [address]  CBC     Status: Abnormal   Collection Time: 04/26/24  6:44 AM  Result Value Ref Range   WBC 22.0 (H) 4.0 - 10.5 K/uL   RBC 3.14 (L) 4.22 - 5.81 MIL/uL   Hemoglobin 9.0 (L) 13.0 - 17.0 g/dL   HCT 08.6 (L) 76.1 - 95.0 %   MCV 91.1 80.0 - 100.0 fL   MCH 28.7 26.0 - 34.0 pg  MCHC 31.5 30.0 - 36.0 g/dL   RDW 91.4 (H) 78.2 - 95.6 %   Platelets 345 150 - 400 K/uL   nRBC 0.2 0.0 - 0.2 %    Comment: Performed at [facility], [address], [place], [place] [address]  Magnesium      Status: Abnormal   Collection Time: 04/26/24  7:16 AM  Result Value Ref Range   Magnesium  2.6 (H) 1.7 - 2.4 mg/dL    Comment: Performed at [facility], [address] [place], [place] [address]  Phosphorus     Status: Abnormal   Collection Time: 04/26/24  7:16 AM  Result Value Ref Range   Phosphorus 5.9 (H) 2.5 - 4.6 mg/dL    Comment: Performed at [facility], [address] [place], [place] [address]  Glucose, capillary     Status: Abnormal   Collection Time: 04/26/24  7:45 AM  Result Value Ref Range   Glucose-Capillary 144 (H) 70 - 99 mg/dL    Comment: Glucose reference range applies only to samples taken after fasting for at least 8 hours.   Comment 1 Notify RN   Glucose,  capillary     Status: Abnormal   Collection Time: 04/26/24 11:43 AM  Result Value Ref Range   Glucose-Capillary 192 (H) 70 - 99 mg/dL    Comment: Glucose reference range applies only to samples taken after fasting for at least 8 hours.   Comment 1 Notify RN   Heparin  level (unfractionated)     Status: None   Collection Time: 04/26/24  2:04 PM  Result Value Ref Range   Heparin  Unfractionated 0.38 0.30 - 0.70 IU/mL    Comment: (NOTE) The clinical reportable range upper limit is being lowered to >1.10 to align with the FDA approved guidance for the current laboratory assay.  If heparin  results are below expected values, and patient dosage has  been confirmed, suggest follow up testing of antithrombin III levels. Performed at [facility], [address], [place], [place] [address]   Hepatitis B surface antigen     Status: None   Collection Time: 04/26/24  2:04 PM  Result Value Ref Range   Hepatitis B Surface Ag NON REACTIVE NON REACTIVE    Comment: Performed at [facility], [address], [place], [place] [address]  Glucose, capillary     Status: Abnormal   Collection Time: 04/26/24  4:44 PM  Result Value Ref Range   Glucose-Capillary 196 (H) 70 - 99 mg/dL    Comment: Glucose reference range applies only to samples taken after fasting for at least 8 hours.  Glucose, capillary     Status: Abnormal   Collection Time: 04/26/24  7:40 PM  Result Value Ref Range   Glucose-Capillary 147 (H) 70 - 99 mg/dL    Comment: Glucose reference range applies only to samples taken after fasting for at least 8 hours.  Glucose, capillary     Status: Abnormal   Collection Time: 04/26/24  9:48 PM  Result Value Ref Range   Glucose-Capillary 128 (H) 70 - 99 mg/dL    Comment: Glucose reference range applies only to samples taken after fasting for at least 8 hours.  Heparin  level (unfractionated)     Status: None   Collection Time: 04/26/24 10:00 PM  Result Value Ref Range    Heparin  Unfractionated 0.49 0.30 - 0.70 IU/mL    Comment: (NOTE) The clinical reportable range upper limit is being lowered  to >1.10 to align with the FDA approved guidance for the current laboratory assay.  If heparin  results are below expected values, and patient dosage has  been confirmed, suggest follow up testing of antithrombin III levels. Performed at [facility], [address], [place], [place] [address]   Glucose, capillary     Status: Abnormal   Collection Time: 04/26/24 11:50 PM  Result Value Ref Range   Glucose-Capillary 134 (H) 70 - 99 mg/dL    Comment: Glucose reference range applies only to samples taken after fasting for at least 8 hours.  Prealbumin     Status: None   Collection Time: 04/27/24 12:28 AM  Result Value Ref Range   Prealbumin 22 18 - 38 mg/dL    Comment: Performed at [facility], [address], [place], [place] [address]  Glucose, capillary     Status: Abnormal   Collection Time: 04/27/24  3:50 AM  Result Value Ref Range   Glucose-Capillary 163 (H) 70 - 99 mg/dL    Comment: Glucose reference range applies only to samples taken after fasting for at least 8 hours.  Comprehensive metabolic panel     Status: Abnormal   Collection Time: 04/27/24  4:53 AM  Result Value Ref Range   Sodium 141 135 - 145 mmol/L   Potassium 4.0 3.5 - 5.1 mmol/L   Chloride 108 98 - 111 mmol/L   CO2 18 (L) 22 - 32 mmol/L   Glucose, Bld 183 (H) 70 - 99 mg/dL    Comment: Glucose reference range applies only to samples taken after fasting for at least 8 hours.   BUN 142 (H) 8 - 23 mg/dL   Creatinine, Ser 0.98 (H) 0.61 - 1.24 mg/dL   Calcium  8.1 (L) 8.9 - 10.3 mg/dL   Total Protein 5.5 (L) 6.5 - 8.1 g/dL   Albumin  1.8 (L) 3.5 - 5.0 g/dL   AST 20 15 - 41 U/L   ALT 29 0 - 44 U/L   Alkaline Phosphatase 58 38 - 126 U/L   Total Bilirubin 0.6 0.0 - 1.2 mg/dL   GFR, Estimated 15 (L) >60 mL/min    Comment: (NOTE) Calculated using the CKD-EPI Creatinine Equation  (2021)    Anion gap 15 5 - 15    Comment: Performed at [facility], [address] [place], [place] [address]  Magnesium      Status: Abnormal   Collection Time: 04/27/24  4:53 AM  Result Value Ref Range   Magnesium  2.6 (H) 1.7 - 2.4 mg/dL    Comment: Performed at [facility], [address], [place], [place] [address]  Phosphorus     Status: Abnormal   Collection Time: 04/27/24  4:53 AM  Result Value Ref Range   Phosphorus 7.3 (H) 2.5 - 4.6 mg/dL    Comment: Performed at [facility], [address], [place], [place] [address]  Renal function panel (daily at 0500)     Status: Abnormal   Collection Time: 04/27/24  4:53 AM  Result Value Ref Range   Sodium 141 135 - 145 mmol/L   Potassium 4.0 3.5 - 5.1 mmol/L   Chloride 108 98 - 111 mmol/L   CO2 17 (L) 22 - 32 mmol/L   Glucose, Bld 183 (H) 70 - 99 mg/dL    Comment: Glucose reference range applies only to samples taken after fasting for at least 8 hours.   BUN 144 (H) 8 - 23 mg/dL  Creatinine, Ser 3.87 (H) 0.61 - 1.24 mg/dL   Calcium  8.4 (L) 8.9 - 10.3 mg/dL   Phosphorus 7.3 (H) 2.5 - 4.6 mg/dL   Albumin  1.8 (L) 3.5 - 5.0 g/dL   GFR, Estimated 16 (L) >60 mL/min    Comment: (NOTE) Calculated using the CKD-EPI Creatinine Equation (2021)    Anion gap 16 (H) 5 - 15    Comment: Performed at [facility], [address] [place], [place] [address]  CBC     Status: Abnormal   Collection Time: 04/27/24  4:53 AM  Result Value Ref Range   WBC 20.7 (H) 4.0 - 10.5 K/uL   RBC 3.10 (L) 4.22 - 5.81 MIL/uL   Hemoglobin 8.8 (L) 13.0 - 17.0 g/dL   HCT 46.9 (L) 62.9 - 52.8 %   MCV 89.4 80.0 - 100.0 fL   MCH 28.4 26.0 - 34.0 pg   MCHC 31.8 30.0 - 36.0 g/dL   RDW 41.3 (H) 24.4 - 01.0 %   Platelets 373 150 - 400 K/uL   nRBC 0.3 (H) 0.0 - 0.2 %    Comment: Performed at [facility], [address], [place], [place] [address]  Heparin  level (unfractionated)     Status: None   Collection Time: 04/27/24  4:53 AM   Result Value Ref Range   Heparin  Unfractionated 0.46 0.30 - 0.70 IU/mL    Comment: (NOTE) The clinical reportable range upper limit is being lowered to >1.10 to align with the FDA approved guidance for the current laboratory assay.  If heparin  results are below expected values, and patient dosage has  been confirmed, suggest follow up testing of antithrombin III levels. Performed at [facility], [address], [place], [place] [address]     Imaging / Studies: No results found.    Medications / Allergies: per chart  Antibiotics: Anti-infectives (From admission, onward)    Start     Dose/Rate Route Frequency Ordered Stop   04/21/24 1400  clindamycin  (CLEOCIN ) 900 mg, gentamicin  (GARAMYCIN ) 240 mg in sodium chloride  0.9 % 1,000 mL for intraperitoneal lavage  Status:  Discontinued         Irrigation To Surgery 04/21/24 1346 04/21/24 1618   04/20/24 1400  piperacillin -tazobactam (ZOSYN ) IVPB 3.375 g        3.375 g 12.5 mL/hr over 240 Minutes Intravenous Every 8 hours 04/20/24 0755     04/20/24 1000  metroNIDAZOLE  (FLAGYL ) IVPB 500 mg  Status:  Discontinued        500 mg 100 mL/hr over 60 Minutes Intravenous Every 12 hours 04/20/24 0320 04/20/24 0752   04/20/24 1000  micafungin  (MYCAMINE ) 150 mg in sodium chloride  0.9 % 100 mL IVPB        150 mg 107.5 mL/hr over 1 Hours Intravenous Every 24 hours 04/20/24 0752     04/20/24 0245  ceFEPIme  (MAXIPIME ) 2 g in sodium chloride  0.9 % 100 mL IVPB  Status:  Discontinued        2 g 200 mL/hr over 30 Minutes Intravenous Every 8 hours 04/20/24 0232 04/20/24 0752   04/20/24 0100  metroNIDAZOLE  (FLAGYL ) IVPB 500 mg        500 mg 100 mL/hr over 60 Minutes Intravenous On call to O.R. 04/20/24 0004 04/20/24 0100   04/14/24 1800  ceFEPIme  (MAXIPIME ) 2 g in sodium chloride  0.9 % 100 mL IVPB        2 g 200 mL/hr over 30 Minutes Intravenous Every 8 hours  04/14/24 1003 04/19/24 0957   04/14/24 1600  vancomycin  (VANCOCIN ) IVPB 1000 mg/200  mL premix  Status:  Discontinued        1,000 mg 200 mL/hr over 60 Minutes Intravenous Every 24 hours 04/13/24 1420 04/14/24 1000   04/14/24 1600  vancomycin  (VANCOREADY) IVPB 1500 mg/300 mL  Status:  Discontinued        1,500 mg 150 mL/hr over 120 Minutes Intravenous Every 24 hours 04/14/24 1002 04/16/24 0744   04/14/24 1200  vancomycin  (VANCOCIN ) IVPB 1000 mg/200 mL premix  Status:  Discontinued        1,000 mg 200 mL/hr over 60 Minutes Intravenous Every 24 hours 04/13/24 1053 04/13/24 1109   04/13/24 2200  ceFEPIme  (MAXIPIME ) 2 g in sodium chloride  0.9 % 100 mL IVPB  Status:  Discontinued        2 g 200 mL/hr over 30 Minutes Intravenous Every 12 hours 04/13/24 1036 04/13/24 1109   04/13/24 2200  ceFEPIme  (MAXIPIME ) 2 g in sodium chloride  0.9 % 100 mL IVPB  Status:  Discontinued        2 g 200 mL/hr over 30 Minutes Intravenous Every 12 hours 04/13/24 1425 04/14/24 1003   04/13/24 1500  vancomycin  (VANCOCIN ) 2,000 mg in sodium chloride  0.9 % 500 mL IVPB        2,000 mg 260 mL/hr over 120 Minutes Intravenous  Once 04/13/24 1409 04/13/24 1850   04/13/24 1500  ceFEPIme  (MAXIPIME ) 2 g in sodium chloride  0.9 % 100 mL IVPB  Status:  Discontinued        2 g 200 mL/hr over 30 Minutes Intravenous Every 12 hours 04/13/24 1420 04/13/24 1425   04/13/24 1500  metroNIDAZOLE  (FLAGYL ) IVPB 500 mg  Status:  Discontinued        500 mg 100 mL/hr over 60 Minutes Intravenous Every 12 hours 04/13/24 1420 04/17/24 0725   04/13/24 1200  vancomycin  (VANCOCIN ) 2,000 mg in sodium chloride  0.9 % 500 mL IVPB  Status:  Discontinued        2,000 mg 260 mL/hr over 120 Minutes Intravenous  Once 04/13/24 1052 04/13/24 1121   04/13/24 1100  metroNIDAZOLE  (FLAGYL ) IVPB 500 mg  Status:  Discontinued        500 mg 100 mL/hr over 60 Minutes Intravenous 2 times daily 04/13/24 1007 04/13/24 1109   04/13/24 1100  vancomycin  (VANCOCIN ) IVPB 1000 mg/200 mL premix  Status:  Discontinued        1,000 mg 200 mL/hr over 60 Minutes  Intravenous  Once 04/13/24 1007 04/13/24 1020   04/13/24 1015  ceFEPIme  (MAXIPIME ) 2 g in sodium chloride  0.9 % 100 mL IVPB        2 g 200 mL/hr over 30 Minutes Intravenous STAT 04/13/24 1007 04/14/24 1721   04/10/24 2200  ceFAZolin  (ANCEF ) IVPB 2g/100 mL premix        2 g 200 mL/hr over 30 Minutes Intravenous Every 8 hours 04/10/24 1833 04/11/24 0531   04/10/24 0600  ceFAZolin  (ANCEF ) IVPB 2g/100 mL premix        2 g 200 mL/hr over 30 Minutes Intravenous On call to O.R. 04/10/24 0533 04/10/24 1526         Note: Portions of this report may have been transcribed using voice recognition software. Every effort was made to ensure accuracy; however, inadvertent computerized transcription errors may be present.   Any transcriptional errors that result from this process are unintentional.    Eddye Goodie, MD, FACS, MASCRS Esophageal, Gastrointestinal &  Colorectal Surgery Robotic and Minimally Invasive Surgery  [facility] [address], [address] [place], [place] [address] [phone] Fax [phone] Main  CONTACT INFORMATION: Weekday (9AM-5PM): Call [facility] main office at [phone] Weeknight (5PM-9AM) or Weekend/Holiday: Check EPIC Web Links tab & use AMION (password  [email or web address]) for General Surgery [facility] coverage  Please, DO NOT use SecureChat  (it is not reliable communication to reach operating surgeons & will lead to a delay in care).   Epic staff messaging available for outptient concerns needing 1-2 business day response.      04/27/2024  7:28 AM

## 2024-04-27 NOTE — Progress Notes (Signed)
 Nutrition Follow-up  DOCUMENTATION CODES:  Not applicable  INTERVENTION:  Recommend exchanging large bore NGT for cortrak for higher level of patient comfort and easier ability to progress with SLP. Await MD's response. MD requests cyclic feeds x 18 hours. Pt may benefit from continuous feeds and a lower infusion rate until he is alert enough to consistently consume some PO at each meal. Pt currently sleepy and disoriented. Await SLP evaluation  Adjust tube feeding formula to better met pt's estimated nutrition needs.  Vital 1.5 at 80 ml/h x 18 hours (1440 ml per day) Prosource TF20 60 ml BID Provides 2320 kcal, 137 gm protein, 1100 ml free water  daily 1 packet Juven BID, each packet provides 95 calories, 2.5 grams of protein (collagen) + micronutrients to support wound healing   NUTRITION DIAGNOSIS:  Increased nutrient needs related to acute illness as evidenced by estimated needs. - remains applicable  GOAL:  Patient will meet greater than or equal to 90% of their needs - progressing  MONITOR:  PO intake, TF tolerance, Labs, Weight trends, Skin  REASON FOR ASSESSMENT:  Consult Enteral/tube feeding initiation and management  ASSESSMENT:  73 y.o male patient with history of Parastomal hernia of ileal conduit. On 5/30 underwent hernia repair with urostomy revision.  5/30 - Admit; s/p lysis of ahdesions, repair of incisional, parastomal, left inguinal incarcerated abdominal wall hernias, urostomy ileal conduit revision; CLD 6/2 - NPO; NGT placed for suction; TPN initiated 6/4 - TPN increased to goal; NGT removed by patient; CLD then back to NPO after emesis; NGT replaced 6/9 - OR in the early AM for ex-lap, drainage of right abdominal wall abscess, explantation fo abdominal mesh, small bowel resection (left In discontinuity), AbThera wound VAC placement; returned to ICU intubated 6/10 - OR for washout and closure 6/11 - Worsening AKI with fluid overload, start CRRT 6/14 -  extubated 6/15 - transferred to [facility] for iHD  MAP (art line):  Pt resting in bed at the time of assessment. Lethargic and not very interactive. HD currently being performed at bedside. Wife able to provide some hx. States that pt has been saying he feels ok and is in no pain. Also reports that prior to admission his appetite was good and he ate normally.   On exam, pt with edema likely masking some muscle depletions, but does have wasting present, particularly to the lower body.  Large bore NGT in place with Vital High Protein infusing at 16mL/h x 18 hours (which provides 1260 kcal, 110g protein). Reviewed previous RD's note which had recommended Vital 1.5, will adjust formula to meet pt's full nutrition needs as TPN is to be weaned off tonight. No PO intake yet, SLP attempted to evaluate patient but was undergoing HD and assessment had to be deferred.   Discussed cortrak with attending, ok to exchange NGT for cortrak as service is only available at [facility] on Monday, Wednesday, and Friday and it is likely that pt would greatly benefit from receiving 100% of his nutrition needs to encourage recovery and wound healing. Cortrak tube will be more comfortable and flexible increasing the likelihood pt will perform well with SLP BSE and consume adequate PO intake.   Cyclic feeds requested. Ordered x 18 hours. Will monitor for pt's tolerance to higher infusion rate.   Admit weight: 108.9 kg ? Accuracy, copied forward First measured weight: 98.4 kg (6/5) Current weight: 111.8 kg    Intake/Output Summary (Last 24 hours) at 04/27/2024 1320 Last data filed at  04/27/2024 0900 Gross per 24 hour  Intake 2605.16 ml  Output 3545 ml  Net -939.84 ml  Net IO Since Admission: 8,325.99 mL [04/27/24 1320] UOP: x 24 hours  Drains/Lines: JP Drain, right lateral abdomen, 65 mL x 24 hours JP Drain, left lateral abdomen, 180 mL x 24 hours Ileostomy RUQ, x 24 hours NGT 16 Fr.  PICC,  triple lumen Art Line Temporary HD catheter, triple lumen left IJ Wound Vac, upper medial abdomen  Nutritionally Relevant Medications: Scheduled Meds:  PROSource TF20  60 mL Per Tube Daily   hydrocortisone  sod succinate   50 mg Intravenous Q12H   insulin  aspart  0-20 Units Subcutaneous Q4H   insulin  aspart  5 Units Subcutaneous Q4H   insulin  glargine-yfgn  50 Units Subcutaneous Daily   polycarbophil  625 mg Per Tube BID   Continuous Infusions:  VITAL HIGH PROTEIN     micafungin  (MYCAMINE ) 150 mg in sodium chloride  0.9 % 100 mL IVPB 150 mg (04/27/24 0940)   piperacillin -tazobactam (ZOSYN )  IV 3.375 g (04/27/24 0501)   TPN ADULT (ION) 40 mL/hr at 04/27/24 0434   PRN Meds: phenol  Labs Reviewed: BUN 142, creatinine 3.99 Phosphorus 7.3 Magnesium  2.6 CBG ranges from 128-196 mg/dL over the last 24 hours HgbA1c 5.3% (5/21)  NUTRITION - FOCUSED PHYSICAL EXAM: Flowsheet Row Most Recent Value  Orbital Region Mild depletion  Upper Arm Region No depletion  Thoracic and Lumbar Region No depletion  Buccal Region No depletion  Temple Region No depletion  Clavicle Bone Region No depletion  Clavicle and Acromion Bone Region Mild depletion  Scapular Bone Region Mild depletion  Dorsal Hand No depletion  Patellar Region Severe depletion  Anterior Thigh Region Severe depletion  Posterior Calf Region Severe depletion  Edema (RD Assessment) Moderate  [arms legs]  Hair Reviewed  Eyes Reviewed  Mouth Reviewed  Skin Reviewed  Nails Reviewed    Diet Order:   Diet Order             DIET - DYS 1 Room service appropriate? Yes; Fluid consistency: Thin  Diet effective now                   EDUCATION NEEDS:  Not appropriate for education at this time  Skin: Skin Integrity Issues: Stage 2: - Sacrum (1 x 1 cm) Surgical Incisions: - abdomen, wound vac in place, (21 cm x 4 cm x 4 cm)  Last BM:  6/15 - type 7 via ileostomy  Height:  Ht Readings from Last 1 Encounters:  04/24/24  5' 11 (1.803 m)    Weight:  Wt Readings from Last 1 Encounters:  04/27/24 111.6 kg    Ideal Body Weight:  78.2 kg  BMI:  Body mass index is 34.31 kg/m.  Estimated Nutritional Needs:  Kcal:  2200-2500 kcal/d Protein:  120-140g/d Fluid:  2.2-2.5L/d    Edwena Graham, RD, LDN Registered Dietitian II Please reach out via secure chat

## 2024-04-28 DIAGNOSIS — K430 Incisional hernia with obstruction, without gangrene: Secondary | ICD-10-CM | POA: Diagnosis not present

## 2024-04-28 DIAGNOSIS — A419 Sepsis, unspecified organism: Secondary | ICD-10-CM | POA: Diagnosis not present

## 2024-04-28 DIAGNOSIS — G9341 Metabolic encephalopathy: Secondary | ICD-10-CM | POA: Diagnosis not present

## 2024-04-28 DIAGNOSIS — J9601 Acute respiratory failure with hypoxia: Secondary | ICD-10-CM | POA: Diagnosis not present

## 2024-04-28 LAB — CBC
HCT: 27.2 % — ABNORMAL LOW (ref 39.0–52.0)
Hemoglobin: 8.7 g/dL — ABNORMAL LOW (ref 13.0–17.0)
MCH: 28.6 pg (ref 26.0–34.0)
MCHC: 32 g/dL (ref 30.0–36.0)
MCV: 89.5 fL (ref 80.0–100.0)
Platelets: 329 10*3/uL (ref 150–400)
RBC: 3.04 MIL/uL — ABNORMAL LOW (ref 4.22–5.81)
RDW: 17.1 % — ABNORMAL HIGH (ref 11.5–15.5)
WBC: 17.9 10*3/uL — ABNORMAL HIGH (ref 4.0–10.5)
nRBC: 0.1 % (ref 0.0–0.2)

## 2024-04-28 LAB — GLUCOSE, CAPILLARY
Glucose-Capillary: 118 mg/dL — ABNORMAL HIGH (ref 70–99)
Glucose-Capillary: 122 mg/dL — ABNORMAL HIGH (ref 70–99)
Glucose-Capillary: 143 mg/dL — ABNORMAL HIGH (ref 70–99)
Glucose-Capillary: 182 mg/dL — ABNORMAL HIGH (ref 70–99)
Glucose-Capillary: 205 mg/dL — ABNORMAL HIGH (ref 70–99)
Glucose-Capillary: 81 mg/dL (ref 70–99)
Glucose-Capillary: 99 mg/dL (ref 70–99)

## 2024-04-28 LAB — RENAL FUNCTION PANEL
Albumin: 2.1 g/dL — ABNORMAL LOW (ref 3.5–5.0)
Albumin: 2.1 g/dL — ABNORMAL LOW (ref 3.5–5.0)
Anion gap: 13 (ref 5–15)
Anion gap: 13 (ref 5–15)
BUN: 107 mg/dL — ABNORMAL HIGH (ref 8–23)
BUN: 118 mg/dL — ABNORMAL HIGH (ref 8–23)
CO2: 23 mmol/L (ref 22–32)
CO2: 22 mmol/L (ref 22–32)
Calcium: 8.1 mg/dL — ABNORMAL LOW (ref 8.9–10.3)
Calcium: 8.1 mg/dL — ABNORMAL LOW (ref 8.9–10.3)
Chloride: 102 mmol/L (ref 98–111)
Chloride: 103 mmol/L (ref 98–111)
Creatinine, Ser: 3 mg/dL — ABNORMAL HIGH (ref 0.61–1.24)
Creatinine, Ser: 3.3 mg/dL — ABNORMAL HIGH (ref 0.61–1.24)
GFR, Estimated: 22 mL/min — ABNORMAL LOW (ref >60)
GFR, Estimated: 19 mL/min — ABNORMAL LOW (ref >60)
Glucose, Bld: 154 mg/dL — ABNORMAL HIGH (ref 70–99)
Glucose, Bld: 207 mg/dL — ABNORMAL HIGH (ref 70–99)
Phosphorus: 5.1 mg/dL — ABNORMAL HIGH (ref 2.5–4.6)
Phosphorus: 6 mg/dL — ABNORMAL HIGH (ref 2.5–4.6)
Potassium: 3.9 mmol/L (ref 3.5–5.1)
Potassium: 4.4 mmol/L (ref 3.5–5.1)
Sodium: 138 mmol/L (ref 135–145)
Sodium: 138 mmol/L (ref 135–145)

## 2024-04-28 LAB — HEPATITIS B SURFACE ANTIBODY, QUANTITATIVE: Hep B S AB Quant (Post): <3.5 m[IU]/mL — ABNORMAL LOW

## 2024-04-28 LAB — HEPARIN LEVEL (UNFRACTIONATED): Heparin Unfractionated: 0.42 [IU]/mL (ref 0.30–0.70)

## 2024-04-28 LAB — MAGNESIUM: Magnesium: 2.2 mg/dL (ref 1.7–2.4)

## 2024-04-28 MED ORDER — SODIUM CHLORIDE 0.9 % IV SOLN
150.0000 mg | INTRAVENOUS | Status: DC
  Administered 2024-04-29: 150 mg via INTRAVENOUS
  Filled 2024-04-28 (×3): qty 7.5

## 2024-04-28 MED ORDER — PRISMASOL BGK 4/2.5 32-4-2.5 MEQ/L EC SOLN: Status: DC

## 2024-04-28 MED ORDER — VITAL 1.5 CAL PO LIQD
1440.0000 mL | ORAL | Status: DC
  Filled 2024-04-28: qty 2000

## 2024-04-28 MED ORDER — VITAL 1.5 CAL PO LIQD
1440.0000 mL | ORAL | Status: DC
  Administered 2024-04-28: 1000 mL
  Filled 2024-04-28: qty 1659

## 2024-04-28 MED ORDER — LOPERAMIDE HCL 1 MG/7.5ML PO SUSP
2.0000 mg | Freq: Every day | ORAL | Status: DC
  Administered 2024-04-29 – 2024-05-06 (×7): 2 mg
  Filled 2024-04-28 (×9): qty 15

## 2024-04-28 MED ORDER — LOPERAMIDE HCL 2 MG PO CAPS: 2.0000 mg | ORAL_CAPSULE | Freq: Two times a day (BID) | ORAL | Status: DC

## 2024-04-28 MED ORDER — SODIUM CHLORIDE 0.9 % IV SOLN
100.0000 mg | Freq: Once | INTRAVENOUS | Status: AC
  Administered 2024-04-28: 100 mg via INTRAVENOUS
  Filled 2024-04-28: qty 5

## 2024-04-28 MED ORDER — INSULIN ASPART 100 UNIT/ML IJ SOLN
5.0000 [IU] | INTRAMUSCULAR | Status: DC
  Administered 2024-04-28 – 2024-04-29 (×4): 5 [IU] via SUBCUTANEOUS

## 2024-04-28 MED ORDER — SODIUM CHLORIDE 0.9 % FOR CRRT: INTRAVENOUS_CENTRAL | Status: DC | PRN

## 2024-04-28 MED ORDER — HEPARIN SODIUM (PORCINE) 1000 UNIT/ML DIALYSIS
1000.0000 [IU] | INTRAMUSCULAR | Status: DC | PRN
  Administered 2024-05-01: 2800 [IU] via INTRAVENOUS_CENTRAL
  Filled 2024-04-28: qty 4
  Filled 2024-04-28: qty 6

## 2024-04-28 MED ORDER — LOPERAMIDE HCL 1 MG/7.5ML PO SUSP
2.0000 mg | Freq: Two times a day (BID) | ORAL | Status: DC
  Administered 2024-04-28: 2 mg
  Filled 2024-04-28 (×2): qty 15

## 2024-04-28 NOTE — Progress Notes (Signed)
 PHARMACY - ANTICOAGULATION CONSULT NOTE  Pharmacy Consult for Heparin  Indication: h/o PE  Allergies  Allergen Reactions   Demerol  [Meperidine ] Nausea And Vomiting and Other (See Comments)    SEVERE NAUSEA    Patient Measurements: Height: 5' 11 (180.3 cm) Weight: 107.9 kg (237 lb 14 oz) IBW/kg (Calculated) : 75.3 HEPARIN  DW (KG): 100.8  Vital Signs: Temp: 98.3 F (36.8 C) (06/17 0804) Temp Source: Oral (06/17 0804) BP: 139/67 (06/17 0600) Pulse Rate: 89 (06/17 0600)  Labs: Recent Labs    04/26/24 0644 04/26/24 1404 04/26/24 2200 04/27/24 0453 04/28/24 0441  HGB 9.0*  --   --  8.8* 8.7*  HCT 28.6*  --   --  27.7* 27.2*  PLT 345  --   --  373 329  HEPARINUNFRC  --    < > 0.49 0.46 0.42  CREATININE 3.29*  --   --  3.99*  3.87* 3.00*   < > = values in this interval not displayed.    Estimated Creatinine Clearance: 28.2 mL/min (A) (by C-G formula based on SCr of 3 mg/dL (H)).   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   moderate atypia - right low back   Atypical nevus 04/04/2007   moderate to marked - right upper back (wider shave)   Atypical nevus 04/04/2007   moderate atypia - center chest (wider shave)   Atypical nevus 04/04/2007   slight atypia - right thigh   Atypical nevus 11/29/2011   mild atypia - center upper back   Atypical nevus 11/29/2011   mild atypia - center chest   Bacteremia due to Klebsiella pneumoniae 10/09/2017   Bladder cancer (HCC) dx 07/2017   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 denies   Sleep apnea    cpap   Squamous cell carcinoma of skin 05/22/2013   left cheek - CX3 + 5FU   Wears glasses     Assessment:  Patient with prolonged hospitalization including post-op complications resulting in bowel perforation with open abdomen. Has been closed and transferred to [facility] On Eliquis PTA for hx of PE. Has been on heparin  gtt since 6/6.   AM heparin  level therapeutic at 0.42, HgB 8.7, and PLTs 329. No issues noted.   Goal of Therapy:  Heparin  level 0.3-0.7 units/ml Monitor platelets by anticoagulation protocol: Yes   Plan:  Continue heparin  at 1900u/hr.  Monitor for s/sx of bleeding.  Monitor CBC and heparin  level daily.  F/u in next few days about ability to transition back to DOAC. Awaiting improvement in renal fxn.   Mamie Searles, PharmD, BCCCP  04/28/2024 8:15 AM

## 2024-04-28 NOTE — Progress Notes (Signed)
 NAME:  NAQUAN GARMAN, MRN:  [id number], DOB:  [birth year], LOS: 18 ADMISSION DATE:  04/10/2024, CONSULTATION DATE:  04/13/2024 REFERRING MD:  Dr Bonita Bussing, CHIEF COMPLAINT: Sepsis  History of Present Illness:  S/p repair of incisional/parastomal and ventral hernias, left inguinal incarcerated , abdominal hernias, lysis of adhesions, urostomy revision Day 1 postoperatively  Transferred to the ICU secondary to tachycardia, tachypnea  History of bladder cancer, prostate cancer, diastolic heart failure, diabetes, right bundle branch block, multiple abdominal surgeries  Update 04/20/24: ccm reconsulted post operatively after pt had perforated bowel and returned to OR. Pt is requiring neo and norepi at this time. He remains intubated at this time. Profoundly acidotic with bicarb of 12. Will start sodium bicarb infusion and bolus prior to initiation. Follow labs in am. Updated wife and son at bedside.   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   moderate atypia - right low back   Atypical nevus 04/04/2007   moderate to marked - right upper back (wider shave)   Atypical nevus 04/04/2007   moderate atypia - center chest (wider shave)   Atypical nevus 04/04/2007   slight atypia - right thigh   Atypical nevus 11/29/2011   mild atypia - center upper back   Atypical nevus 11/29/2011   mild atypia - center chest   Bacteremia due to Klebsiella pneumoniae 10/09/2017   Bladder cancer (HCC) dx 07/2017   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 denies   Sleep apnea    cpap   Squamous cell carcinoma of skin 05/22/2013   left cheek - CX3 + 5FU   Wears glasses    Significant Hospital Events: Including procedures, antibiotic start and stop dates in addition to other pertinent events   04/10/2024-laparoscopic surgery 04/13/24 CCM consult. Incr NE. Art line  6/3 weaning NE. Gently diuresed w 3L off  6/4 off NE. Pulled out his NGT refused replacement. After multiple emesis  6/5 cont NGT 6/6 tx to med surg 6/9: taken back to OR for perforated bowel, post-op shock 6/10: Norepinephrine , Phenylephrine , and vasopressin , added steroids, 1u PRBCs, back to OR for washout and closure 6/11: Worsening AKI with fluid overload, start CRRT, decreasing pressor requirements 6/13 tolerating CRRT 6/14: Off CRRT, arousable.  Tolerated weaning for most of the day 6/13 6/14: Extubated, CRRT stopped. 6/15 transferred to [facility] for iHD; montoring for renal recovery 6/16 iHD, severely encephalopathic with high BUN  Interim History / Subjective:   More awake this morning per wife, but not responding to her when she talks to him.   Objective    Blood pressure 139/67, pulse 89, temperature 98.8 F (37.1 C), temperature source Axillary, resp. rate 19, height 5' 11 (1.803  m), weight 107.9 kg, SpO2 94%.        Intake/Output Summary (Last 24 hours) at 04/28/2024 0722 Last data filed at 04/28/2024 0600 Gross per 24 hour  Intake 3188.26 ml  Output 4385 ml  Net -1196.74 ml   Filed Weights   04/27/24 1011 04/27/24 1350 04/28/24 0500  Weight: 111.6 kg 110 kg 107.9 kg    Examination: General: ill appearing man lying in bed in NAD neuro: eyes open but not responding to verbal stimulation or during exam HEENT: [place]/AT, [place] RESP: breathing  comfortably on [place], reduced basilar breath sounds, scattered rhonchi CV: S1S2, RRR GI: Soft, NT. Midline wound vac, colostomy, urostomy withoutput.  LUQ drain with purulent drainage. GU: urostomy with clear yellow urine Skin: bruising on arms Extremities: slightly improved edema   BUN 107 Cr 3.00 Phos 5.1 WBC 17.9 H/H  8.7/27.2  Platelets 329  6/10 operative culture: E Coli (R to amp & amp-sulbactam, otherwise sensitive), E faecalis (pan-sensitive)  Resolved problem list    Assessment and Plan   Robotic hernia repair-- incisional, parastomal, left inguinal Septic shock due to acute bowel perforation - OR 6/9, back to the OR 6/10 for closure and ileal resection and end ileostomy creation -con't prolonged course of antimicrobials due to need for mesh in contaminated abdomen. ID consulted per surgery.  -Weaned TPN off. On full dose TF- cycling down to 16 hrs per day per surgery's recommendations. Goal is working towards 12h/day. -decrease steroids again tomorrow> hopefully can stop -remains off pressors; appreciate surgery's management  Acute hypoxemic respiratory failure - Extubated 6/14; mental status still makes him at risk for reintubation -pulmonary hygiene; mental status makes this as challenge -wean O2 as able  Acute metabolic encephalopathy due to renal failure, uremia -RRT per Nephro; still uremic unfortunately. Worry he needs more aggressive HD; will discuss iHD today vs going back on CRRT for metabolic clearance.  -avoid sedating meds -wife has been at bedside diligently -too encephalopathic to participate much with PT, OT, SLP  Acute kidney injury Acute metabolic acidosis due to renal failure hyperphosphatemia -strict I/O -renally dose meds, avoid nephrotoxic meds -monitor UOP from urostomy -con't renvela BID for a few days  Hypertension; antihypertensives -con't amlodipine and metoprolol ; Aline reading higher than cuff  History of diabetes -SSI PRN -con't  glargine 25 units daily -goal BG 140-180  Anemia Right flank hematoma History of PE, was on Eliquis at home -con't heparin  -no plans to restart eliquis until we are less likely to need ICU procedures   History of HFpEF -volume management per nephro -TED hose  Moderate protein calorie malnutrition -con't TF; cycling to less hours per day to hopefully increase his appetite  Deconditioning Concern for dysphagia -needs PT, OT, SLP  Wife updated at bedside during rounds.    Best Practice (right click and Reselect all SmartList Selections daily)   Diet/type: tubefeeds ,  tpn DVT prophylaxis systemic heparin   Pressure ulcer(s): per nursing assessment GI prophylaxis: N/A Lines: Central line, PICC art line  Foley:  N/A urostomy  Code Status:  full code Last date of multidisciplinary goals of care discussion [spouse updated daily]   This patient is critically ill with multiple organ system failure which requires frequent high complexity decision making, assessment, support, evaluation, and titration of therapies. This was completed through the application of advanced monitoring technologies and extensive interpretation of multiple databases. During this encounter critical care time was devoted to patient care services described in this note for 41 minutes.  Rice Chamorro  Evie Hoff, DO 04/28/24 8:44 AM [facility]  For contact information, see Amion. If no response to pager, please call PCCM consult pager. After hours, 7PM- 7AM, please call Elink.

## 2024-04-28 NOTE — Progress Notes (Signed)
 eLink Physician-Brief Progress Note Patient Name: Kristopher Thompson DOB: Mar 17, 1951 MRN: [id number]   Date of Service  04/28/2024  HPI/Events of Note  Patient alarming ST Elevation on monitor  eICU Interventions  EKG with no evidence of ST changes, continue monitoring     Intervention Category Intermediate Interventions: Arrhythmia - evaluation and management  Cacey Willow 04/28/2024, 9:22 PM

## 2024-04-28 NOTE — Progress Notes (Addendum)
 04/28/2024  Kristopher Thompson [id number] 03/19/1951  CARE TEAM: PCP: Dudley Ghee, MD  Outpatient Care Team: Patient Care Team: Dudley Ghee, MD as PCP - General (Internal Medicine) Devon Fogo, MD (Inactive) as Consulting Physician (Dermatology) Lavina Pou, MD as Referring Physician (Pulmonary Disease) Candyce Champagne, MD as Consulting Physician (General Surgery) Florencio Hunting, MD as Consulting Physician (Urology)  Inpatient Treatment Team: Treatment Team:  Candyce Champagne, MD Etter Hermann., MD Florencio Hunting, MD Pccm, Md, MD Clementine Cutting, MD Catheline Clos, RN Baron Border, MD Terre Ferri, MD Gwenevere Lent, RN Merrilyn Abler, NT Synthia Ewing, [facility]   Problem List:   Principal Problem:   Incarcerated incisional hernia Active Problems:   S/P ileal conduit [facility])   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 08/08/2017   GERD (gastroesophageal reflux disease)   Obesity (BMI 35.0-39.9 without comorbidity)   Prolonged QT interval   Non-recurrent bilateral inguinal hernia without obstruction or gangrene   Sinus tachycardia   Tachypnea   Acute respiratory insufficiency, postoperative   Sepsis due to undetermined organism (HCC)   Lactic acidosis   Class 2 obesity   Chronic anticoagulation   Hearing loss   History of bladder cancer   Obstructive sleep apnea of adult   Parastomal hernia of ileal conduit   Partial small bowel obstruction (HCC)   Personal history of PE (pulmonary embolism)   04/10/2024  POST-OPERATIVE DIAGNOSIS:  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   PROCEDURE:   -ROBOTIC LYSIS OF ADHESIONS X 4 HOURS -COMPONENT SEPARATION - TRANSVERSUS ABDOMINIS REALEASE (TAR) BILATERAL -ROBOTIC REPAIRS OF  INCISIONAL, PARASTOMAL, LEFT INGUINAL  INCARCERATED ABDOMINAL WALL HERNIAS WITH MESH -UROSTOMY ILEAL CONDUIT REVISION -INTRAOPERATIVE ASSESSMENT OF TISSUE VASCULAR PERFUSION USING  ICG (indocyanine green ) -IMMUNOFLUORESCENCE -TRANSVERSUS ABDOMINIS PLANE (TAP) BLOCK - BILATERAL    SURGEON:  Eddye Goodie, MD  OR FINDINGS:  Patient had dense adhesions anterior abdominal wall.  Largest hernia was in the right lower quadrant around the ileal conduit urostomy.  16 x 12 cm region.  Incarcerated with over a foot of small bowel.  Midline next largest 9 x 8 cm incarcerated with numerous loops of small bowel.  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  Another direct space hernia in the left lower quadrant and indirect hernia on the left lower quadrant.  No obvious hernias on the right side.   Component separation's done on both sides to try and get the largest midline and parastomal hernias close down with running serrated try to fix absorbable suture.  Broad underlay repair with 30 x 35 cm mesh transverse  04/20/2024 POST-OPERATIVE DIAGNOSIS:  perforated bowel   PROCEDURE:  Drainage of right abdominal wall abscess as well as intra-abdominal abscess Explantation of abdominal mesh Small bowel resection (in discontinuity) Placement of ABThera wound VAC   SURGEON:  Aldean Hummingbird, MD  ASSISTANTS: Adalberto Acton, MD   04/21/2024  POST-OPERATIVE DIAGNOSIS:  DELAYED BOWEL PERFORATION WITH OPEN ABDOMEN   PROCEDURE:   LYSIS OF ADHESIONS X ABDOMINAL WALL DEBRIDEMENT ILEAL RESECTION END ILEOSTOMY ABDOMINAL WALL PARASTOMAL & INCISIONAL HERNIA REPAIRS WITH PHASIX MESH FASCIA CLOSURE WITH WOUND VAC PLACEMENT IN SQ   SURGEON:  Eddye Goodie, MD   ASSISTANT:  [name], MD   Ostomy:  Right upper quadrant: End ileostomy Right lower quadrant: Urostomy ileal conduit   Drains: 19 Fr Blake drains x2   Right lower quadrant drain rests in the right  panniculus abdominal wall between the Phasix mesh and abdominal wall   Left lower quadrant drain rest in the anterior peritoneum between the mesh and the bowel with the tip resting in the right lower  quadrant near the base of the ileal conduit and prior bowel resection  Assessment [facility] Stay = 18 days) 7 Days Post-Op    Stabilizing but still very challenged  Plan:  Shock resolved.  Nutrition: Bowel function returned.  Tolerating tube feeds at goal.  Cycle tube feeds.  I prefer tube feeds at night so stomach is empty and he is more alert during the daytime.  Go next to 90 an hour x 16 hours.  Fiber to thicken ileostomy effluent -soluble polyfiber/FiberCon  Add loperamide to avoid too much high output ileostomy.  Weaned off TPN 6/16   PO as tolerated.  Concern with fair mental status, so we will postpone until better clear with speech therapy.  Hold off on G-tube unless poor PO effort >14days  Infection due to delayed bowel perforation and abdominal wall infection/necrosis  Growing multiple organisms including Candida albicans, Enterobacter, and Enterococcus.    IV Zosyn /micafungin .  Anticipate prolonged antibiotics given the abdominal wall infection/necrosis and need for Phasix mesh.    I have asked infectious disease (Dr Lynell Sar)  to help evaluate.    Discussed with Dr. Fulton Job with critical care and we both feel micafungin  more appropriate treatment for right now until C albicans sensitivities can be done.  Cardiac/rhythm issues with fluconazole .    Change wound vac q MonWedFri the hopes for the subcutaneous tissues to recover.  Last change Monday 6/16 looked pretty good without infection/necrosis = continue.  If progressive necrosis or concerns may need to switch to packing.    AKI in the setting of urostomy ileal conduit and shock.    Initially oliguric and now higher volume.    Intraperitoneal surgical Blake fluid creatinine 6/12 same as serum argues against leak.  Dr Rozanne Corners aware  Still with elevated uremia so plan for dialysis in the hopes that he will gradually recover.  Hence the transfer to [facility].  I agree with Dr. Fulton Job that the uremia most  likely is a contributor to his mental status and hopefully will improve as that is better treated.  Dr. Fulton Job to discuss with nephrology about going back on continuous dialysis to improve his uremia faster.  See if nephrology agrees  Metabolic encephalopathy with all the stresses he has been through, especially Uremia.    I agree with Dr. Fulton Job that the uremia most likely is a contributor to his mental status and hopefully will improve as that is better treated.  Dr. Fulton Job to discuss with nephrology about going back on continuous dialysis to improve his uremia faster.  See if nephrology agrees   CT of chest negative for any obvious pulmonary embolism or pneumonia.  Oxygen needs less.  Echocardiogram shows grade 1 diastolic dysfunction which I believe is his baseline.  Defer to critical care/internal medicine they feel further is needed aside for some monitor diuresis.  ABLA on top of anemia of chronic disease improved with transfusion.  Follow.    -monitor electrolytes & replace as needed.  Keep K>4, Mg>2, Phos>3.    -Diabetes.  Sliding scale insulin .    -VTE prophylaxis-  Full anticoagulation heparin  drip given history of pulmonary embolism, CCM wished to be aggressive with hep gtt - follow Hgb & BP closely.  Could consider into an enteral route    I updated  the patient's status to the the patient's wife, Dr. Fulton Job with pulmonary critical care, Dr. Mammie Sears chief of surgery, & ICU nurses at bedside & ICU nurse in room.  Recommendations were made.  Questions were answered.  They expressed understanding & appreciation.  -Disposition: He is going to be here a while       I reviewed nursing notes, last 24 h vitals and pain scores, last 48 h intake and output, last 24 h labs and trends, and last 24 h imaging results.  I have reviewed this patient's available data, including medical history, events of note, test results, etc as part of my evaluation.   A significant portion of that time was  spent in counseling. Care during the described time interval was provided by me.  This care required high  level of medical decision making.  04/28/2024    Subjective: (Chief complaint)  No major events.  Tolerated intermittent dialysis yesterday.  Not particularly alert or interactive today but answering some simple questions at best.  ICU nurse Marlis Simper, critical care attending, ICU pharmacy just inside room.  Wife remains in room  Objective:  Vital signs:  Vitals:   04/28/24 0515 04/28/24 0530 04/28/24 0545 04/28/24 0600  BP:    139/67  Pulse: 88 90 88 89  Resp: (!) 21 (!) 22 (!) 21 19  Temp:      TempSrc:      SpO2: 98% 95% 98% 94%  Weight:      Height:        Last BM Date : 04/27/24  Intake/Output   Yesterday:  06/16 0701 - 06/17 0700 In: 3188.3 [I.V.:1006.5; NG/GT:1823.7; IV Piggyback:358.1] Out: 4098 [Urine:1375; Drains:135; Stool:1375] This shift:  No intake/output data recorded.  Bowel function:  Flatus: YES  BM:  YES -small  Drains:  RLQ drain (rests between abdominal wall and Phasix mesh): Serous    LLQ drain (runs over bowel with tip down in RLQ pelvis near base of ileal conduit and site of bowel resection):  thinly serosanguinous   Wound vac (in SQ over closed fascia):  serosanguineous   Physical Exam:  General: Awake but not providing much eye contact.  Follows a few commands.  Answering simple questions.  Not agitated Eyes: PERRL, normal EOM.  Sclera clear.  No icterus Neuro: CN II-XII intact w/o focal sensory/motor deficits. Lymph: No head/neck/groin lymphadenopathy Psych:  No delerium/psychosis/paranoia.  No agitation HENT: Normocephalic, Mucus membranes moist.  No thrush.  ETT & NGT in place Neck: Supple, No tracheal deviation.  No obvious thyromegaly Chest: No pain to chest wall compression.  Good respiratory excursion.   CV:  Pulses intact.  Regular rhythm.  1-2+ BUE/BLE edema MS:  No obvious deformity  Abdomen:  Obese but Soft.   Nondistended.  Wound vac in midline.    Flank ecchymosis bilateral posterior resolving.  Edema along right flank and panniculus with less edema/erythema - improved.  RUQ: (End ileostomy): Molasses thick black effluent in bag low volume RLQ:  (Urostomy ileal conduit): pink with clear light yellow-colored urine  Ext:   No deformity.  Bilateral hand 2+ edema greater than lower extremity   No cyanosis Skin: No petechiae / purpurea.  No major sores.  Warm and dry    Results:   Cultures: Recent Results (from the past 720 hours)  Culture, blood (Routine X 2) w Reflex to ID Panel     Status: None   Collection Time: 04/13/24  7:13 AM   Specimen: BLOOD  Result Value  Ref Range Status   Specimen Description   Final    BLOOD BLOOD LEFT ARM AEROBIC BOTTLE ONLY ANAEROBIC BOTTLE ONLY Performed at [facility], [address], [place], [place] [address]    Special Requests   Final    BOTTLES DRAWN AEROBIC AND ANAEROBIC Blood Culture results may not be optimal due to an inadequate volume of blood received in culture bottles Performed at [facility], [address], [place], [place] [address]    Culture   Final    NO GROWTH 5 DAYS Performed at [facility], [address], [place], [place] [address]    Report Status 04/18/2024 FINAL  Final  Culture, blood (Routine X 2) w Reflex to ID Panel     Status: None   Collection Time: 04/13/24  7:20 AM   Specimen: BLOOD  Result Value Ref Range Status   Specimen Description   Final    BLOOD BLOOD RIGHT ARM AEROBIC BOTTLE ONLY ANAEROBIC BOTTLE ONLY Performed at [facility], [address], [place], [place] [address]    Special Requests   Final    BOTTLES DRAWN AEROBIC AND ANAEROBIC Blood Culture results may not be optimal due to an inadequate volume of blood received in culture bottles Performed at [facility], [address], [place], [place] [address]    Culture    Final    NO GROWTH 5 DAYS Performed at [facility], [address], [place], [place] [address]    Report Status 04/18/2024 FINAL  Final  Urine Culture     Status: None   Collection Time: 04/13/24 10:18 AM   Specimen: Urine, Clean Catch  Result Value Ref Range Status   Specimen Description   Final    URINE, CLEAN CATCH Performed at [facility], [address], [place], [place] [address]    Special Requests   Final    NONE Performed at [facility], [address], [place], [place] [address]    Culture   Final    NO GROWTH Performed at [facility], [address] [place], [place] [address]    Report Status 04/14/2024 FINAL  Final  MRSA Next Gen by PCR, Nasal     Status: None   Collection Time: 04/13/24 11:35 AM   Specimen: Nasal Mucosa; Nasal Swab  Result Value Ref Range Status   MRSA by PCR Next Gen NOT DETECTED NOT DETECTED Final    Comment: (NOTE) The GeneXpert MRSA Assay (FDA approved for NASAL specimens only), is one component of a comprehensive MRSA colonization surveillance program. It is not intended to diagnose MRSA infection nor to guide or monitor treatment for MRSA infections. Test performance is not FDA approved in patients less than 41 years old. Performed at [facility], [address], [place], [place] [address]   Culture, blood (Routine X 2) w Reflex to ID Panel     Status: None   Collection Time: 04/20/24  8:46 AM   Specimen: BLOOD RIGHT ARM  Result Value Ref Range Status   Specimen Description   Final    BLOOD RIGHT ARM Performed at [facility], [address] [place], [place] [address]    Special Requests   Final    BOTTLES DRAWN AEROBIC AND ANAEROBIC Blood Culture results may not be optimal due to an inadequate volume of blood received in culture bottles Performed at [facility], [address] [place], [place] [address]    Culture   Final    NO  GROWTH 5 DAYS Performed at [facility], [address] [place], [place] [address]    Report Status 04/25/2024 FINAL  Final  Culture, blood (Routine X 2) w Reflex to ID Panel     Status: None   Collection Time: 04/20/24  8:49 AM   Specimen: BLOOD  Result Value Ref Range Status   Specimen Description   Final    BLOOD Performed at [facility], [address] [place], [place] [address]    Special Requests   Final    BOTTLES DRAWN AEROBIC AND ANAEROBIC Blood Culture results may not be optimal due to an inadequate volume of blood received in culture bottles Performed at [facility], [address] [place], [place] [address]    Culture   Final    NO GROWTH 5 DAYS Performed at [facility], [address] [place], [place] [address]    Report Status 04/25/2024 FINAL  Final  Culture, Respiratory w Gram Stain     Status: None   Collection Time: 04/20/24 12:03 PM   Specimen: Tracheal Aspirate; Respiratory  Result Value Ref Range Status   Specimen Description   Final    TRACHEAL ASPIRATE Performed at [facility], [address] [place], [place] [address]    Special Requests   Final    NONE Performed at [facility], [address], [place], [place] [address]    Gram Stain NO WBC SEEN RARE GRAM POSITIVE COCCI   Final   Culture   Final    RARE Normal respiratory flora-no Staph aureus or Pseudomonas seen Performed at [facility], [address], [place], [place] [address]    Report Status 04/23/2024 FINAL  Final  Aerobic/Anaerobic Culture w Gram Stain (surgical/deep wound)     Status: None   Collection Time: 04/21/24  2:23 PM   Specimen: Soft Tissue, Other  Result Value Ref Range Status   Specimen Description   Final    TISSUE INTRA ABDOMINAL NECROTIC TISSUE Performed at [facility], [address] [place], [place] [address]    Special Requests   Final    NONE Performed  at [facility], [address] [place], [place] [address]    Gram Stain   Final    FEW WBC PRESENT,BOTH PMN AND MONONUCLEAR RARE GRAM POSITIVE COCCI IN PAIRS AND CHAINS RARE YEAST    Culture   Final    RARE ESCHERICHIA COLI FEW CANDIDA ALBICANS FEW ENTEROCOCCUS FAECALIS FEW BACTEROIDES SPECIES BETA LACTAMASE POSITIVE Performed at [facility], [address], [place], [place] [address]    Report Status 04/25/2024 FINAL  Final   Organism ID, Bacteria ESCHERICHIA COLI  Final   Organism ID, Bacteria ENTEROCOCCUS FAECALIS  Final      Susceptibility   Escherichia coli - MIC*    AMPICILLIN >=32 RESISTANT Resistant     CEFEPIME  <=0.12 SENSITIVE Sensitive     CEFTAZIDIME <=1 SENSITIVE Sensitive     CEFTRIAXONE  1 SENSITIVE Sensitive     CIPROFLOXACIN  <=0.25 SENSITIVE Sensitive     GENTAMICIN  <=1 SENSITIVE Sensitive     IMIPENEM <=0.25 SENSITIVE Sensitive     TRIMETH /SULFA  <=20 SENSITIVE Sensitive     AMPICILLIN/SULBACTAM >=32 RESISTANT Resistant     PIP/TAZO 8 SENSITIVE Sensitive ug/mL    * RARE  ESCHERICHIA COLI   Enterococcus faecalis - MIC*    AMPICILLIN <=2 SENSITIVE Sensitive     VANCOMYCIN  1 SENSITIVE Sensitive     GENTAMICIN  SYNERGY SENSITIVE Sensitive     * FEW ENTEROCOCCUS FAECALIS    Labs: Results for orders placed or performed during the hospital encounter of 04/10/24 (from the past 48 hours)  Glucose, capillary     Status: Abnormal   Collection Time: 04/26/24 11:43 AM  Result Value Ref Range   Glucose-Capillary 192 (H) 70 - 99 mg/dL    Comment: Glucose reference range applies only to samples taken after fasting for at least 8 hours.   Comment 1 Notify RN   Heparin  level (unfractionated)     Status: None   Collection Time: 04/26/24  2:04 PM  Result Value Ref Range   Heparin  Unfractionated 0.38 0.30 - 0.70 IU/mL    Comment: (NOTE) The clinical reportable range upper limit is being lowered to >1.10 to align with the FDA approved guidance  for the current laboratory assay.  If heparin  results are below expected values, and patient dosage has  been confirmed, suggest follow up testing of antithrombin III levels. Performed at [facility], [address], [place], [place] [address]   Hepatitis B surface antigen     Status: None   Collection Time: 04/26/24  2:04 PM  Result Value Ref Range   Hepatitis B Surface Ag NON REACTIVE NON REACTIVE    Comment: Performed at [facility], [address], [place], [place] [address]  Hepatitis B surface antibody,quantitative     Status: Abnormal   Collection Time: 04/26/24  2:04 PM  Result Value Ref Range   Hep B S AB Quant (Post) <3.5 (L) Immunity>10 mIU/mL    Comment: (NOTE)  Status of Immunity                     Anti-HBs Level  ------------------                     -------------- Inconsistent with Immunity                  0.0 - 10.0 Consistent with Immunity                         >10.0 Performed At: [facility] [address] [place], [place] [id number] Pearlean Botts MD WG:[id number]   Glucose, capillary     Status: Abnormal   Collection Time: 04/26/24  4:44 PM  Result Value Ref Range   Glucose-Capillary 196 (H) 70 - 99 mg/dL    Comment: Glucose reference range applies only to samples taken after fasting for at least 8 hours.  Glucose, capillary     Status: Abnormal   Collection Time: 04/26/24  7:40 PM  Result Value Ref Range   Glucose-Capillary 147 (H) 70 - 99 mg/dL    Comment: Glucose reference range applies only to samples taken after fasting for at least 8 hours.  Glucose, capillary     Status: Abnormal   Collection Time: 04/26/24  9:48 PM  Result Value Ref Range   Glucose-Capillary 128 (H) 70 - 99 mg/dL    Comment: Glucose reference range applies only to samples taken after fasting for at least 8 hours.  Heparin  level (unfractionated)     Status: None   Collection Time: 04/26/24 10:00 PM  Result Value Ref Range   Heparin   Unfractionated 0.49  0.30 - 0.70 IU/mL    Comment: (NOTE) The clinical reportable range upper limit is being lowered to >1.10 to align with the FDA approved guidance for the current laboratory assay.  If heparin  results are below expected values, and patient dosage has  been confirmed, suggest follow up testing of antithrombin III levels. Performed at [facility], [address] [place], [place] [address]   Glucose, capillary     Status: Abnormal   Collection Time: 04/26/24 11:50 PM  Result Value Ref Range   Glucose-Capillary 134 (H) 70 - 99 mg/dL    Comment: Glucose reference range applies only to samples taken after fasting for at least 8 hours.  Prealbumin     Status: None   Collection Time: 04/27/24 12:28 AM  Result Value Ref Range   Prealbumin 22 18 - 38 mg/dL    Comment: Performed at [facility], [address] [place], [place] [address]  Glucose, capillary     Status: Abnormal   Collection Time: 04/27/24  3:50 AM  Result Value Ref Range   Glucose-Capillary 163 (H) 70 - 99 mg/dL    Comment: Glucose reference range applies only to samples taken after fasting for at least 8 hours.  Comprehensive metabolic panel     Status: Abnormal   Collection Time: 04/27/24  4:53 AM  Result Value Ref Range   Sodium 141 135 - 145 mmol/L   Potassium 4.0 3.5 - 5.1 mmol/L   Chloride 108 98 - 111 mmol/L   CO2 18 (L) 22 - 32 mmol/L   Glucose, Bld 183 (H) 70 - 99 mg/dL    Comment: Glucose reference range applies only to samples taken after fasting for at least 8 hours.   BUN 142 (H) 8 - 23 mg/dL   Creatinine, Ser 8.29 (H) 0.61 - 1.24 mg/dL   Calcium  8.1 (L) 8.9 - 10.3 mg/dL   Total Protein 5.5 (L) 6.5 - 8.1 g/dL   Albumin  1.8 (L) 3.5 - 5.0 g/dL   AST 20 15 - 41 U/L   ALT 29 0 - 44 U/L   Alkaline Phosphatase 58 38 - 126 U/L   Total Bilirubin 0.6 0.0 - 1.2 mg/dL   GFR, Estimated 15 (L) >60 mL/min    Comment: (NOTE) Calculated using the CKD-EPI Creatinine Equation (2021)     Anion gap 15 5 - 15    Comment: Performed at [facility], [address], [place], [place] [address]  Magnesium      Status: Abnormal   Collection Time: 04/27/24  4:53 AM  Result Value Ref Range   Magnesium  2.6 (H) 1.7 - 2.4 mg/dL    Comment: Performed at [facility], [address] [place], [place] [address]  Phosphorus     Status: Abnormal   Collection Time: 04/27/24  4:53 AM  Result Value Ref Range   Phosphorus 7.3 (H) 2.5 - 4.6 mg/dL    Comment: Performed at [facility], [address], [place], [place] [address]  Renal function panel (daily at 0500)     Status: Abnormal   Collection Time: 04/27/24  4:53 AM  Result Value Ref Range   Sodium 141 135 - 145 mmol/L   Potassium 4.0 3.5 - 5.1 mmol/L   Chloride 108 98 - 111 mmol/L   CO2 17 (L) 22 - 32 mmol/L   Glucose, Bld 183 (H) 70 - 99 mg/dL    Comment: Glucose reference range applies only to samples  taken after fasting for at least 8 hours.   BUN 144 (H) 8 - 23 mg/dL   Creatinine, Ser 6.30 (H) 0.61 - 1.24 mg/dL   Calcium  8.4 (L) 8.9 - 10.3 mg/dL   Phosphorus 7.3 (H) 2.5 - 4.6 mg/dL   Albumin  1.8 (L) 3.5 - 5.0 g/dL   GFR, Estimated 16 (L) >60 mL/min    Comment: (NOTE) Calculated using the CKD-EPI Creatinine Equation (2021)    Anion gap 16 (H) 5 - 15    Comment: Performed at [id number], [address], [place], [place] [address]  CBC     Status: Abnormal   Collection Time: 04/27/24  4:53 AM  Result Value Ref Range   WBC 20.7 (H) 4.0 - 10.5 K/uL   RBC 3.10 (L) 4.22 - 5.81 MIL/uL   Hemoglobin 8.8 (L) 13.0 - 17.0 g/dL   HCT 93.2 (L) 35.5 - 73.2 %   MCV 89.4 80.0 - 100.0 fL   MCH 28.4 26.0 - 34.0 pg   MCHC 31.8 30.0 - 36.0 g/dL   RDW 20.2 (H) 54.2 - 70.6 %   Platelets 373 150 - 400 K/uL   nRBC 0.3 (H) 0.0 - 0.2 %    Comment: Performed at [facility], [address], [place], [place] [address]  Heparin  level (unfractionated)     Status: None   Collection Time: 04/27/24  4:53 AM  Result  Value Ref Range   Heparin  Unfractionated 0.46 0.30 - 0.70 IU/mL    Comment: (NOTE) The clinical reportable range upper limit is being lowered to >1.10 to align with the FDA approved guidance for the current laboratory assay.  If heparin  results are below expected values, and patient dosage has  been confirmed, suggest follow up testing of antithrombin III levels. Performed at [facility], [address] [place], [place] [address]   Glucose, capillary     Status: Abnormal   Collection Time: 04/27/24  8:07 AM  Result Value Ref Range   Glucose-Capillary 179 (H) 70 - 99 mg/dL    Comment: Glucose reference range applies only to samples taken after fasting for at least 8 hours.  Glucose, capillary     Status: Abnormal   Collection Time: 04/27/24 11:53 AM  Result Value Ref Range   Glucose-Capillary 153 (H) 70 - 99 mg/dL    Comment: Glucose reference range applies only to samples taken after fasting for at least 8 hours.  Glucose, capillary     Status: Abnormal   Collection Time: 04/27/24  4:23 PM  Result Value Ref Range   Glucose-Capillary 100 (H) 70 - 99 mg/dL    Comment: Glucose reference range applies only to samples taken after fasting for at least 8 hours.  Glucose, capillary     Status: Abnormal   Collection Time: 04/27/24  7:26 PM  Result Value Ref Range   Glucose-Capillary 149 (H) 70 - 99 mg/dL    Comment: Glucose reference range applies only to samples taken after fasting for at least 8 hours.  Glucose, capillary     Status: Abnormal   Collection Time: 04/27/24 11:43 PM  Result Value Ref Range   Glucose-Capillary 151 (H) 70 - 99 mg/dL    Comment: Glucose reference range applies only to samples taken after fasting for at least 8 hours.  Glucose, capillary     Status: Abnormal   Collection Time: 04/28/24  3:38 AM  Result Value Ref Range   Glucose-Capillary 143 (H) 70 -  99 mg/dL    Comment: Glucose reference range applies only to samples taken after fasting for at  least 8 hours.  Renal function panel (daily at 0500)     Status: Abnormal   Collection Time: 04/28/24  4:41 AM  Result Value Ref Range   Sodium 138 135 - 145 mmol/L   Potassium 3.9 3.5 - 5.1 mmol/L   Chloride 102 98 - 111 mmol/L   CO2 23 22 - 32 mmol/L   Glucose, Bld 154 (H) 70 - 99 mg/dL    Comment: Glucose reference range applies only to samples taken after fasting for at least 8 hours.   BUN 107 (H) 8 - 23 mg/dL   Creatinine, Ser 4.40 (H) 0.61 - 1.24 mg/dL   Calcium  8.1 (L) 8.9 - 10.3 mg/dL   Phosphorus 5.1 (H) 2.5 - 4.6 mg/dL   Albumin  2.1 (L) 3.5 - 5.0 g/dL   GFR, Estimated 22 (L) >60 mL/min    Comment: (NOTE) Calculated using the CKD-EPI Creatinine Equation (2021)    Anion gap 13 5 - 15    Comment: Performed at [facility], [address], [place], [place] [address]  Magnesium      Status: None   Collection Time: 04/28/24  4:41 AM  Result Value Ref Range   Magnesium  2.2 1.7 - 2.4 mg/dL    Comment: Performed at [facility], [address], [place], [place] [address]  CBC     Status: Abnormal   Collection Time: 04/28/24  4:41 AM  Result Value Ref Range   WBC 17.9 (H) 4.0 - 10.5 K/uL   RBC 3.04 (L) 4.22 - 5.81 MIL/uL   Hemoglobin 8.7 (L) 13.0 - 17.0 g/dL   HCT 87.5 (L) 64.3 - 32.9 %   MCV 89.5 80.0 - 100.0 fL   MCH 28.6 26.0 - 34.0 pg   MCHC 32.0 30.0 - 36.0 g/dL   RDW 51.8 (H) 84.1 - 66.0 %   Platelets 329 150 - 400 K/uL   nRBC 0.1 0.0 - 0.2 %    Comment: Performed at [facility], [address], [place], [place] [address]  Heparin  level (unfractionated)     Status: None   Collection Time: 04/28/24  4:41 AM  Result Value Ref Range   Heparin  Unfractionated 0.42 0.30 - 0.70 IU/mL    Comment: (NOTE) The clinical reportable range upper limit is being lowered to >1.10 to align with the FDA approved guidance for the current laboratory assay.  If heparin  results are below expected values, and patient dosage has  been confirmed, suggest follow up  testing of antithrombin III levels. Performed at [facility], [address], [place], [place] [address]     Imaging / Studies: DG Abd Portable 1V Result Date: 04/27/2024 CLINICAL DATA:  Feeding tube placement EXAM: PORTABLE ABDOMEN - 1 VIEW COMPARISON:  04/15/2024 FINDINGS: Frontal view of the lower chest and upper abdomen demonstrates enteric catheter tip projecting over the gastric body. Bowel gas pattern is unremarkable. No acute bony abnormalities. IMPRESSION: 1. Enteric catheter tip projecting over the gastric body. Electronically Signed   By: Bobbye Burrow M.D.   On: 04/27/2024 15:11      Medications / Allergies: per chart  Antibiotics: Anti-infectives (From admission, onward)    Start     Dose/Rate Route Frequency Ordered Stop   04/28/24 1000  micafungin  (MYCAMINE ) 100 mg in sodium chloride  0.9 % 100 mL IVPB  100 mg 105 mL/hr over 1 Hours Intravenous Every 24 hours 04/27/24 1219     04/21/24 1400  clindamycin  (CLEOCIN ) 900 mg, gentamicin  (GARAMYCIN ) 240 mg in sodium chloride  0.9 % 1,000 mL for intraperitoneal lavage  Status:  Discontinued         Irrigation To Surgery 04/21/24 1346 04/21/24 1618   04/20/24 1400  piperacillin -tazobactam (ZOSYN ) IVPB 3.375 g        3.375 g 12.5 mL/hr over 240 Minutes Intravenous Every 8 hours 04/20/24 0755     04/20/24 1000  metroNIDAZOLE  (FLAGYL ) IVPB 500 mg  Status:  Discontinued        500 mg 100 mL/hr over 60 Minutes Intravenous Every 12 hours 04/20/24 0320 04/20/24 0752   04/20/24 1000  micafungin  (MYCAMINE ) 150 mg in sodium chloride  0.9 % 100 mL IVPB  Status:  Discontinued        150 mg 107.5 mL/hr over 1 Hours Intravenous Every 24 hours 04/20/24 0752 04/27/24 1219   04/20/24 0245  ceFEPIme  (MAXIPIME ) 2 g in sodium chloride  0.9 % 100 mL IVPB  Status:  Discontinued        2 g 200 mL/hr over 30 Minutes Intravenous Every 8 hours 04/20/24 0232 04/20/24 0752   04/20/24 0100  metroNIDAZOLE  (FLAGYL ) IVPB 500 mg        500  mg 100 mL/hr over 60 Minutes Intravenous On call to O.R. 04/20/24 0004 04/20/24 0100   04/14/24 1800  ceFEPIme  (MAXIPIME ) 2 g in sodium chloride  0.9 % 100 mL IVPB        2 g 200 mL/hr over 30 Minutes Intravenous Every 8 hours 04/14/24 1003 04/19/24 0957   04/14/24 1600  vancomycin  (VANCOCIN ) IVPB 1000 mg/200 mL premix  Status:  Discontinued        1,000 mg 200 mL/hr over 60 Minutes Intravenous Every 24 hours 04/13/24 1420 04/14/24 1000   04/14/24 1600  vancomycin  (VANCOREADY) IVPB 1500 mg/300 mL  Status:  Discontinued        1,500 mg 150 mL/hr over 120 Minutes Intravenous Every 24 hours 04/14/24 1002 04/16/24 0744   04/14/24 1200  vancomycin  (VANCOCIN ) IVPB 1000 mg/200 mL premix  Status:  Discontinued        1,000 mg 200 mL/hr over 60 Minutes Intravenous Every 24 hours 04/13/24 1053 04/13/24 1109   04/13/24 2200  ceFEPIme  (MAXIPIME ) 2 g in sodium chloride  0.9 % 100 mL IVPB  Status:  Discontinued        2 g 200 mL/hr over 30 Minutes Intravenous Every 12 hours 04/13/24 1036 04/13/24 1109   04/13/24 2200  ceFEPIme  (MAXIPIME ) 2 g in sodium chloride  0.9 % 100 mL IVPB  Status:  Discontinued        2 g 200 mL/hr over 30 Minutes Intravenous Every 12 hours 04/13/24 1425 04/14/24 1003   04/13/24 1500  vancomycin  (VANCOCIN ) 2,000 mg in sodium chloride  0.9 % 500 mL IVPB        2,000 mg 260 mL/hr over 120 Minutes Intravenous  Once 04/13/24 1409 04/13/24 1850   04/13/24 1500  ceFEPIme  (MAXIPIME ) 2 g in sodium chloride  0.9 % 100 mL IVPB  Status:  Discontinued        2 g 200 mL/hr over 30 Minutes Intravenous Every 12 hours 04/13/24 1420 04/13/24 1425   04/13/24 1500  metroNIDAZOLE  (FLAGYL ) IVPB 500 mg  Status:  Discontinued        500 mg 100 mL/hr over 60 Minutes Intravenous Every 12 hours 04/13/24 1420 04/17/24 0725  04/13/24 1200  vancomycin  (VANCOCIN ) 2,000 mg in sodium chloride  0.9 % 500 mL IVPB  Status:  Discontinued        2,000 mg 260 mL/hr over 120 Minutes Intravenous  Once 04/13/24 1052  04/13/24 1121   04/13/24 1100  metroNIDAZOLE  (FLAGYL ) IVPB 500 mg  Status:  Discontinued        500 mg 100 mL/hr over 60 Minutes Intravenous 2 times daily 04/13/24 1007 04/13/24 1109   04/13/24 1100  vancomycin  (VANCOCIN ) IVPB 1000 mg/200 mL premix  Status:  Discontinued        1,000 mg 200 mL/hr over 60 Minutes Intravenous  Once 04/13/24 1007 04/13/24 1020   04/13/24 1015  ceFEPIme  (MAXIPIME ) 2 g in sodium chloride  0.9 % 100 mL IVPB        2 g 200 mL/hr over 30 Minutes Intravenous STAT 04/13/24 1007 04/14/24 1721   04/10/24 2200  ceFAZolin  (ANCEF ) IVPB 2g/100 mL premix        2 g 200 mL/hr over 30 Minutes Intravenous Every 8 hours 04/10/24 1833 04/11/24 0531   04/10/24 0600  ceFAZolin  (ANCEF ) IVPB 2g/100 mL premix        2 g 200 mL/hr over 30 Minutes Intravenous On call to O.R. 04/10/24 0533 04/10/24 1526         Note: Portions of this report may have been transcribed using voice recognition software. Every effort was made to ensure accuracy; however, inadvertent computerized transcription errors may be present.   Any transcriptional errors that result from this process are unintentional.    Eddye Goodie, MD, FACS, MASCRS Esophageal, Gastrointestinal & Colorectal Surgery Robotic and Minimally Invasive Surgery  [facility] [address] [place], [place] [address] [phone] Fax [phone] Main  CONTACT INFORMATION: Weekday (9AM-5PM): Call [facility] main office at [phone] Weeknight (5PM-9AM) or Weekend/Holiday: Check EPIC Web Links tab & use AMION (password  [email or web address]) for General Surgery [facility] coverage  Please, DO NOT use SecureChat  (it is not reliable communication to reach operating surgeons & will lead to a delay in care).   Epic staff messaging available for outptient concerns needing 1-2 business day response.      04/28/2024  7:46 AM

## 2024-04-28 NOTE — Progress Notes (Signed)
 Brief Nutrition Support Note  New consult received for pt with re-initiation of CRRT. Reviewed events over the previous 24 hours and pt discussed during ICU rounds and with RN and CCM. SLP assessed 6/16 and 6/17 and noted that pt is too lethargic to participate in swallow evaluation and unsafe to take in POs at this time. CRRT restarted in hopes that lowering BUN well help with mentation and pt will be more alert and able to take in PO nutrition.   Surgery reduced cyclic feeding time to same volume ( ) over 16 hours in hopes that appetite will be stimulated. Would recommend holding on further increases in infusion rate until pt has a diet ordered and is able to take in POs. Noted that goal from surgery team is to cycle over 12 hours. Once PO intake is possible, nocturnal feeds would most certainly be beneficial in ensuring pt is meeting nutrition needs while also supporting ability to feel hunger and fullness cues during the day to encourage appetite.   When pt able to have diet advance and further reduction of infusion time to 12 hours is warranted, the following regimen would meet 100% of needs: Vital 1.5 at 120 ml/h x 12 hours (1440 ml per day) Prosource TF20 60 ml BID Provides 2320 kcal, 137 gm protein, 1100 ml free water  daily  If pt taking in some PO, the following regimen would meet 75% of needs: Vital 1.5 at 90 ml/h x 12 hours (1080 ml per day) Prosource TF20 60 ml BID Provides 1780 kcal, 113 gm protein, 825 ml free water  daily  When pt eating some PO at each meal, the following regimen would meet 50% of needs: Vital 1.5 at 60 ml/h x 12 hours (720 ml per day) Prosource TF20 60 ml BID Provides 1240 kcal, 89 gm protein, 550 ml free water  daily   For last full assessment see RD note from 6/16.    Edwena Graham, RD, LDN, CNSC Registered Dietitian II Please reach out via secure chat

## 2024-04-28 NOTE — TOC Progression Note (Addendum)
 Transition of Care [facility]) - Progression Note    Patient Details  Name: Kristopher Thompson MRN: [id number] Date of Birth: 06-Aug-1951  Transition of Care [facility]) CM/SW Contact  Juliane Och, LCSW Phone Number: 04/28/2024, 3:50 PM  Clinical Narrative:     3:50 PM Attending ordered PT/OT orders for re-evaluation upon hospital transfer.  Expected Discharge Plan: IP Rehab Facility Barriers to Discharge: Continued Medical Work up  Expected Discharge Plan and Services   Discharge Planning Services: CM Consult   Living arrangements for the past 2 months: Single Family Home                                       Social Determinants of Health (SDOH) Interventions SDOH Screenings   Food Insecurity: No Food Insecurity (04/12/2024)  Housing: Low Risk  (04/12/2024)  Transportation Needs: No Transportation Needs (04/12/2024)  Utilities: Not At Risk (04/12/2024)  Social Connections: Socially Integrated (04/10/2024)  Tobacco Use: Medium Risk (04/10/2024)    Readmission Risk Interventions    04/12/2024    4:58 PM  Readmission Risk Prevention Plan  Transportation Screening Complete  PCP or Specialist Appt within 3-5 Days Complete  HRI or Home Care Consult Complete  Social Work Consult for Recovery Care Planning/Counseling Complete  Palliative Care Screening Not Applicable  Medication Review [employer]) Complete

## 2024-04-28 NOTE — Progress Notes (Signed)
 Kristopher Thompson NEPHROLOGY PROGRESS NOTE  Assessment/ Plan: Pt is a 73 y.o. yo male  with past medical history significant for hypertension, DM, CAD, bladder cancer, CHF, right bundle branch block who had acute bowel perforation and status post OR for bowel repair on 6/8, lysis of adhesion, ileal resection end ileostomy hernia repairs, septic shock seen as a consultation for the evaluation and management of AKI and oliguria.   # Acute kidney injury, oliguric with anasarca likely ischemic ATN due to septic shock and obstructive uropathy given bilateral hydronephrosis.  CRRT 6/11-6/14 now off monitoring for recovery.  UOP great now but clearance lagging.  BUN remains ~100.  He was transferred to [facility] for hemodialysis.  Received this session on 6/16 -Because of the patient's persistent altered mental status and high metabolic state with elevated BUN we will put him back on CRRT to get his BUN down - Because of good urine output hopefully he will recover with time  # Bilateral hydronephrosis severe on the left side: S/p revision of ileal conduit 5/30,  urology following.    # Acute bowel perforation is status post bowel repair ileal conduit and hernia repairs urostomy ileal conduit revision.  As per surgical team.    # HTN: Continue current medications and adjust volume status with dialysis as tolerated   # Acute hypoxic respiratory failure/VDRF per pulmonary team. Extubated 6/14, currently on nasal cannula   Will follow.   Subjective: Tolerated dialysis yesterday.  1.4 L of urine output.  1.5 L of ultrafiltration.  Patient remains altered.  Objective Vital signs in last 24 hours: Vitals:   04/28/24 0530 04/28/24 0545 04/28/24 0600 04/28/24 0804  BP:   139/67   Pulse: 90 88 89   Resp: (!) 22 (!) 21 19   Temp:    98.3 F (36.8 C)  TempSrc:    Oral  SpO2: 95% 98% 94%   Weight:      Height:       Weight change: -0.2 kg  Intake/Output Summary (Last 24 hours) at  04/28/2024 1034 Last data filed at 04/28/2024 0900 Gross per 24 hour  Intake 2608.2 ml  Output 4500 ml  Net -1891.8 ml       Labs: RENAL PANEL Recent Labs  Lab 04/25/24 0508 04/25/24 0618 04/26/24 0507 04/26/24 0644 04/26/24 0716 04/27/24 0453 04/28/24 0441  NA  --  135  136 131* 138  --  141  141 138  K  --  3.6  3.6 3.7 3.8  --  4.0  4.0 3.9  CL  --  104  104 101 106  --  108  108 102  CO2  --  22  23 21* 21*  --  18*  17* 23  GLUCOSE  --  142*  141* 620* 139*  --  183*  183* 154*  BUN  --  54*  53* 100* 99*  --  142*  144* 107*  CREATININE  --  1.77*  1.75* 3.19* 3.29*  --  3.99*  3.87* 3.00*  CALCIUM   --  6.9*  7.0* 8.3* 7.9*  --  8.1*  8.4* 8.1*  MG 2.8*  --  2.6*  --  2.6* 2.6* 2.2  PHOS  --  2.7 5.8*  --  5.9* 7.3*  7.3* 5.1*  ALBUMIN   --  1.9* 1.9* 2.0*  --  1.8*  1.8* 2.1*    Liver Function Tests: Recent Labs  Lab 04/23/24 0416 04/23/24 1600 04/26/24 0644 04/27/24  1610 04/28/24 0441  AST 21  --  25 20  --   ALT 13  --  33 29  --   ALKPHOS 46  --  58 58  --   BILITOT 0.7  --  0.9 0.6  --   PROT 4.9*  --  5.6* 5.5*  --   ALBUMIN  1.8*  1.7*   < > 2.0* 1.8*  1.8* 2.1*   < > = values in this interval not displayed.   No results for input(s): LIPASE, AMYLASE in the last 168 hours. No results for input(s): AMMONIA in the last 168 hours. CBC: Recent Labs    04/25/24 0508 04/26/24 0507 04/26/24 0644 04/27/24 0453 04/28/24 0441  HGB 7.5* 8.8* 9.0* 8.8* 8.7*  MCV 91.4 92.7 91.1 89.4 89.5    Cardiac Enzymes: No results for input(s): CKTOTAL, CKMB, CKMBINDEX, TROPONINI in the last 168 hours. CBG: Recent Labs  Lab 04/27/24 1926 04/27/24 2343 04/28/24 0338 04/28/24 0801 04/28/24 1026  GLUCAP 149* 151* 143* 81 99    Iron Studies: No results for input(s): IRON, TIBC, TRANSFERRIN, FERRITIN in the last 72 hours. Studies/Results: DG Abd Portable 1V Result Date: 04/27/2024 CLINICAL DATA:  Feeding tube  placement EXAM: PORTABLE ABDOMEN - 1 VIEW COMPARISON:  04/15/2024 FINDINGS: Frontal view of the lower chest and upper abdomen demonstrates enteric catheter tip projecting over the gastric body. Bowel gas pattern is unremarkable. No acute bony abnormalities. IMPRESSION: 1. Enteric catheter tip projecting over the gastric body. Electronically Signed   By: Bobbye Burrow M.D.   On: 04/27/2024 15:11     Medications: Infusions:  sodium chloride  10 mL/hr at 04/28/24 0400   albumin  human     heparin  1,900 Units/hr (04/28/24 0400)   micafungin  (MYCAMINE ) 100 mg in sodium chloride  0.9 % 100 mL IVPB     piperacillin -tazobactam (ZOSYN )  IV 3.375 g (04/28/24 0540)   prismasol  BGK 4/2.5     prismasol  BGK 4/2.5     prismasol  BGK 4/2.5      Scheduled Medications:  amLODipine  5 mg Per Tube Daily   Chlorhexidine  Gluconate Cloth  6 each Topical Q2200   Chlorhexidine  Gluconate Cloth  6 each Topical Q0600   feeding supplement (PROSource TF20)  60 mL Per Tube BID   [START ON 04/29/2024] feeding supplement (VITAL 1.5 CAL)  1,440 mL Per Tube Q24H   hydrocortisone  sod succinate (SOLU-CORTEF ) inj  50 mg Intravenous Daily   insulin  aspart  0-20 Units Subcutaneous Q4H   insulin  aspart  5 Units Subcutaneous 5 times per day   insulin  glargine-yfgn  25 Units Subcutaneous Daily   loperamide HCl  2 mg Per Tube BID   metoprolol  tartrate  25 mg Per Tube BID   nutrition supplement (JUVEN)  1 packet Per Tube BID BM   mouth rinse  15 mL Mouth Rinse 4 times per day   polycarbophil  625 mg Per Tube BID   sevelamer carbonate  2.4 g Per Tube BID    have reviewed scheduled and prn medications.  Physical Exam: General: nad, sleepy but arousable, not answering my questions Heart:normal rate, no rub Lungs: Coarse breath sound bilateral.  Bilateral chest rise, no iwob Abdomen: Mild distention, no pain with palpation Extremities: 1+ diffuse edema present Dialysis Access: LIJ Temporary HD line c/d/i  Levorn Reason 04/28/2024,10:34 AM  LOS: 18 days

## 2024-04-28 NOTE — Progress Notes (Signed)
 Speech Language Pathology Treatment: Dysphagia  Patient Details Name: MEHDI GIRONDA MRN: [id number] DOB: [birth year] Today's Date: 04/28/2024 Time: 1120-1130 SLP Time Calculation (min) (ACUTE ONLY): 10 min  Assessment / Plan / Recommendation Clinical Impression  Pt presents with a decreased level of alertness compared to previous date. He opened his eyes briefly as SLP provided oral care but did not open his mouth or otherwise respond when ice chips were brought to his lips. He is not demonstrating readiness for POs beyond ice chips in moderation after oral care. Education was provided to pt's wife and SLP will continue following.    HPI HPI: KEIMON BASALDUA is a 73 yo male s/p repair of incisional, parastomal, and L inguinal incarcerated abdominal hernias on 5/30. SLP assessed 6/7 with recommendations to initiate Dys 2 diet with thin liquids as well as aspiration and esophageal precautions. Found to have bowel perf s/p OR repair 6/9, remained intubated after the procedure 6/9-6/14. Worsening AKI 6/11 with fluid overload, CRRT initiated. PMH includes CHF, bladder cancer, prostate cancer, DM, renal disorder, RBBB, multiple abdominal surgeries, GERD      SLP Plan  Continue with current plan of care          Recommendations  Diet recommendations: NPO Medication Administration: Via alternative means                  Oral care QID;Oral care prior to ice chip/H20   Frequent or constant Supervision/Assistance Dysphagia, unspecified (R13.10)     Continue with current plan of care     Amil Kale, M.A., CCC-SLP Speech Language Pathology, Acute Rehabilitation Services  Secure Chat preferred [phone]   04/28/2024, 11:36 AM

## 2024-04-29 DIAGNOSIS — B9620 Unspecified Escherichia coli [E. coli] as the cause of diseases classified elsewhere: Secondary | ICD-10-CM

## 2024-04-29 DIAGNOSIS — B9689 Other specified bacterial agents as the cause of diseases classified elsewhere: Secondary | ICD-10-CM

## 2024-04-29 DIAGNOSIS — N179 Acute kidney failure, unspecified: Secondary | ICD-10-CM | POA: Diagnosis not present

## 2024-04-29 DIAGNOSIS — K631 Perforation of intestine (nontraumatic): Secondary | ICD-10-CM | POA: Diagnosis not present

## 2024-04-29 DIAGNOSIS — G9341 Metabolic encephalopathy: Secondary | ICD-10-CM | POA: Diagnosis not present

## 2024-04-29 DIAGNOSIS — G9340 Encephalopathy, unspecified: Secondary | ICD-10-CM | POA: Diagnosis not present

## 2024-04-29 DIAGNOSIS — J9601 Acute respiratory failure with hypoxia: Secondary | ICD-10-CM | POA: Diagnosis not present

## 2024-04-29 DIAGNOSIS — L8990 Pressure ulcer of unspecified site, unspecified stage: Secondary | ICD-10-CM | POA: Diagnosis not present

## 2024-04-29 DIAGNOSIS — I5030 Unspecified diastolic (congestive) heart failure: Secondary | ICD-10-CM | POA: Diagnosis not present

## 2024-04-29 DIAGNOSIS — B952 Enterococcus as the cause of diseases classified elsewhere: Secondary | ICD-10-CM

## 2024-04-29 LAB — GLUCOSE, CAPILLARY
Glucose-Capillary: 156 mg/dL — ABNORMAL HIGH (ref 70–99)
Glucose-Capillary: 170 mg/dL — ABNORMAL HIGH (ref 70–99)
Glucose-Capillary: 110 mg/dL — ABNORMAL HIGH (ref 70–99)
Glucose-Capillary: 116 mg/dL — ABNORMAL HIGH (ref 70–99)
Glucose-Capillary: 139 mg/dL — ABNORMAL HIGH (ref 70–99)
Glucose-Capillary: 138 mg/dL — ABNORMAL HIGH (ref 70–99)

## 2024-04-29 LAB — RENAL FUNCTION PANEL
Albumin: 2.1 g/dL — ABNORMAL LOW (ref 3.5–5.0)
Albumin: 2.1 g/dL — ABNORMAL LOW (ref 3.5–5.0)
Anion gap: 12 (ref 5–15)
Anion gap: 10 (ref 5–15)
BUN: 88 mg/dL — ABNORMAL HIGH (ref 8–23)
BUN: 66 mg/dL — ABNORMAL HIGH (ref 8–23)
CO2: 23 mmol/L (ref 22–32)
CO2: 25 mmol/L (ref 22–32)
Calcium: 8 mg/dL — ABNORMAL LOW (ref 8.9–10.3)
Calcium: 8.1 mg/dL — ABNORMAL LOW (ref 8.9–10.3)
Chloride: 104 mmol/L (ref 98–111)
Chloride: 104 mmol/L (ref 98–111)
Creatinine, Ser: 2.51 mg/dL — ABNORMAL HIGH (ref 0.61–1.24)
Creatinine, Ser: 1.97 mg/dL — ABNORMAL HIGH (ref 0.61–1.24)
GFR, Estimated: 27 mL/min — ABNORMAL LOW (ref >60)
GFR, Estimated: 36 mL/min — ABNORMAL LOW (ref >60)
Glucose, Bld: 164 mg/dL — ABNORMAL HIGH (ref 70–99)
Glucose, Bld: 120 mg/dL — ABNORMAL HIGH (ref 70–99)
Phosphorus: 3.7 mg/dL (ref 2.5–4.6)
Phosphorus: 3.2 mg/dL (ref 2.5–4.6)
Potassium: 4.3 mmol/L (ref 3.5–5.1)
Potassium: 4.5 mmol/L (ref 3.5–5.1)
Sodium: 139 mmol/L (ref 135–145)
Sodium: 139 mmol/L (ref 135–145)

## 2024-04-29 LAB — CBC
HCT: 26.5 % — ABNORMAL LOW (ref 39.0–52.0)
Hemoglobin: 8.4 g/dL — ABNORMAL LOW (ref 13.0–17.0)
MCH: 28.3 pg (ref 26.0–34.0)
MCHC: 31.7 g/dL (ref 30.0–36.0)
MCV: 89.2 fL (ref 80.0–100.0)
Platelets: 305 10*3/uL (ref 150–400)
RBC: 2.97 MIL/uL — ABNORMAL LOW (ref 4.22–5.81)
RDW: 16.6 % — ABNORMAL HIGH (ref 11.5–15.5)
WBC: 12.9 10*3/uL — ABNORMAL HIGH (ref 4.0–10.5)
nRBC: 0 % (ref 0.0–0.2)

## 2024-04-29 LAB — MAGNESIUM: Magnesium: 2.3 mg/dL (ref 1.7–2.4)

## 2024-04-29 LAB — HEPARIN LEVEL (UNFRACTIONATED): Heparin Unfractionated: 0.48 [IU]/mL (ref 0.30–0.70)

## 2024-04-29 MED ORDER — INSULIN GLARGINE-YFGN 100 UNIT/ML ~~LOC~~ SOLN
25.0000 [IU] | Freq: Every day | SUBCUTANEOUS | Status: DC
  Administered 2024-04-29 – 2024-05-21 (×22): 25 [IU] via SUBCUTANEOUS
  Filled 2024-04-29 (×25): qty 0.25

## 2024-04-29 MED ORDER — VITAL 1.5 CAL PO LIQD: 1440.0000 mL | ORAL | Status: DC

## 2024-04-29 MED ORDER — VITAL 1.5 CAL PO LIQD
1440.0000 mL | ORAL | Status: DC
  Administered 2024-04-29 – 2024-05-02 (×4): 1440 mL
  Administered 2024-05-02 – 2024-05-03 (×2): 1000 mL
  Administered 2024-05-03 – 2024-05-04 (×2): 1440 mL
  Administered 2024-05-05: 1000 mL
  Filled 2024-04-29 (×3): qty 2000
  Filled 2024-04-29: qty 1659
  Filled 2024-04-29: qty 2000
  Filled 2024-04-29: qty 1659
  Filled 2024-04-29 (×2): qty 2000

## 2024-04-29 MED ORDER — INSULIN GLARGINE-YFGN 100 UNIT/ML ~~LOC~~ SOLN
10.0000 [IU] | Freq: Every day | SUBCUTANEOUS | Status: DC
  Filled 2024-04-29: qty 0.1

## 2024-04-29 MED ORDER — INSULIN ASPART 100 UNIT/ML IJ SOLN
6.0000 [IU] | INTRAMUSCULAR | Status: DC
  Administered 2024-04-29 – 2024-05-06 (×18): 6 [IU] via SUBCUTANEOUS

## 2024-04-29 MED ORDER — INSULIN ASPART 100 UNIT/ML IJ SOLN: 6.0000 [IU] | INTRAMUSCULAR | Status: DC

## 2024-04-29 NOTE — Consult Note (Signed)
 WOC Nurse wound follow up Wound type: surgical with NPWT Measurement:21 cm x 6 cm x 3 cm  Wound bed: red, moist  Drainage (amount, consistency, odor) serosanguinous in cannister   Periwound: intact Dressing procedure/placement/frequency: Removed old NPWT dressing Cleansed wound with NS Filled wound with __4__ piece of black foam  Sealed NPWT dressing at HG  Patient tolerated procedure well   WOC nurse will continue to provide NPWT dressing changed due to the complexity of the dressing change.   WOC Nurse ostomy follow up Stoma type/location: RLQ ileal conduit, RUQ ileostomy  Stomal assessment/size: see prior note Peristomal assessment:  see prior note Treatment options for stomal/peristomal skin: 2 inch barrier ring Output clear yellow urine from ileal conduit and liquid brown stool from ileostomy Ostomy pouching: see prior note Education provided: none- patient is not oriented at time of visit Enrolled patient in [employer] Discharge program: No    WTA inspected both pouches, appliances are intact and functional, no need for change/removal during this visit.  WOC team will continue to follow patient.  Gillermo Lack, RN, MSN, [facility] WOC Team

## 2024-04-29 NOTE — Evaluation (Signed)
 Physical Therapy Evaluation Patient Details Name: Kristopher Thompson MRN: [id number] DOB: [birth year] Today's Date: 04/29/2024  History of Present Illness  Pt is a 73 y/o M s/p hernia repair and urostomy revision on 5/30. Returned to OR 6/9 for perforated bowel, post op shock, s/p exploratory lapartomy with drainage of R abdominal wall abscess on 6/9. CRRT 6/11-6/14, restarted 6/17. Extubated 6/14, Transferred to [facility] 6/15 for intermittent HD. PMH includes CHF, colostomy, DM, GERD, abdominal surgery.  Clinical Impression  Per chart review pt was independent prior to hospitalization. Pt is currently limited in safe mobility by increased weakness, and decreased ROM, and is currently receiving CRRT. Pt with increased stiffness in LE requiring stretching to improve ROM. Due to pt's increased weakness and need for total A for transfer to [facility], pt bed placed in chair position. Pt reports feeling better sitting up and is left in chair position at end of session. Patient will benefit from continued inpatient follow up therapy, <3 hours/day. PT will continue to see acutely to progress mobility.         If plan is discharge home, recommend the following: A lot of help with walking and/or transfers;A little help with bathing/dressing/bathroom;Assistance with cooking/housework;Assist for transportation;Help with stairs or ramp for entrance   Can travel by private vehicle   No    Equipment Recommendations Other (comment) (TBD)  Recommendations for Other Services       Functional Status Assessment Patient has had a recent decline in their functional status and demonstrates the ability to make significant improvements in function in a reasonable and predictable amount of time.     Precautions / Restrictions Precautions Precaution/Restrictions Comments: bil JP drains, urostomy, NGT, CRRT Restrictions Weight Bearing Restrictions Per Provider Order: No      Mobility  Bed Mobility                General bed mobility comments: tolerates bed mobility, deferred EOB/OOB at this time due to impaired command follow    Transfers                   General transfer comment: deferred           Pertinent Vitals/Pain Pain Assessment Pain Assessment: No/denies pain    Home Living Family/patient expects to be discharged to:: Private residence Living Arrangements: Spouse/significant other Available Help at Discharge: Family;Available 24 hours/day Type of Home: House Home Access: Stairs to enter   [employer] of Steps: 1   Home Layout: One level Home Equipment: Rollator (4 wheels) Additional Comments: taken per chart review    Prior Function Prior Level of Function : Independent/Modified Independent                     Extremity/Trunk Assessment   Upper Extremity Assessment Upper Extremity Assessment: Generalized weakness (PROM WFL, lifts BUE ~6 above bed, 1/5 grasp, edematous)    Lower Extremity Assessment Lower Extremity Assessment: RLE deficits/detail;LLE deficits/detail RLE Deficits / Details: decreased PROM of knee and ankle due to stiffness LLE Deficits / Details: decreased PROM of knee and ankle due to stiffness    Cervical / Trunk Assessment Cervical / Trunk Assessment: Other exceptions Cervical / Trunk Exceptions: cervical rotation to R  Communication   Communication Communication: Impaired Factors Affecting Communication: Reduced clarity of speech;Difficulty expressing self    Cognition Arousal: Lethargic Behavior During Therapy: Flat affect  Following commands: Intact       Cueing Cueing Techniques: Verbal cues     General Comments General comments (skin integrity, edema, etc.): VSS    Exercises General Exercises - Lower Extremity Ankle Circles/Pumps: PROM, Both, 5 reps Long Arc Quad: PROM, Both, 5 reps   Assessment/Plan    PT Assessment Patient needs continued PT  services  PT Problem List Decreased strength;Decreased range of motion;Decreased activity tolerance;Decreased balance;Decreased mobility;Decreased coordination;Decreased cognition;Cardiopulmonary status limiting activity       PT Treatment Interventions DME instruction;Gait training;Stair training;Functional mobility training;Therapeutic activities;Therapeutic exercise;Balance training;Patient/family education    PT Goals (Current goals can be found in the Care Plan section)  Acute Rehab PT Goals PT Goal Formulation: With patient Time For Goal Achievement: 05/13/24 Potential to Achieve Goals: Fair    Frequency Min 3X/week     Co-evaluation PT/OT/SLP Co-Evaluation/Treatment: Yes Reason for Co-Treatment: Complexity of the patient's impairments (multi-system involvement);Necessary to address cognition/behavior during functional activity;To address functional/ADL transfers   OT goals addressed during session: ADL's and self-care;Strengthening/ROM       AM-PAC PT 6 Clicks Mobility  Outcome Measure Help needed turning from your back to your side while in a flat bed without using bedrails?: Total Help needed moving from lying on your back to sitting on the side of a flat bed without using bedrails?: Total Help needed moving to and from a bed to a chair (including a wheelchair)?: Total Help needed standing up from a chair using your arms (e.g., wheelchair or bedside chair)?: Total Help needed to walk in hospital room?: Total Help needed climbing 3-5 steps with a railing? : Total 6 Click Score: 6    End of Session   Activity Tolerance: Treatment limited secondary to medical complications (Comment) (CRRT) Patient left: in bed;with call bell/phone within reach;with nursing/sitter in room;with family/visitor present Nurse Communication: Mobility status PT Visit Diagnosis: Unsteadiness on feet (R26.81);Other abnormalities of gait and mobility (R26.89);Muscle weakness (generalized)  (M62.81);Difficulty in walking, not elsewhere classified (R26.2);Adult, failure to thrive (R62.7)    Time: 1007-1033 PT Time Calculation (min) (ACUTE ONLY): 26 min   Charges:   PT Evaluation $PT Eval Moderate Complexity: 1 Mod   PT General Charges $$ ACUTE PT VISIT: 1 Visit         Samauri Kellenberger B. Jewel Mortimer PT, DPT Acute Rehabilitation Services Please use secure chat or  Call Office [phone]   Verlie Glisson [facility] 04/29/2024, 2:24 PM

## 2024-04-29 NOTE — Progress Notes (Signed)
 04/29/2024  Kristopher Thompson [id number] Nov 19, 1950  CARE TEAM: PCP: Dudley Ghee, MD  Outpatient Care Team: Patient Care Team: Dudley Ghee, MD as PCP - General (Internal Medicine) Devon Fogo, MD (Inactive) as Consulting Physician (Dermatology) Lavina Pou, MD as Referring Physician (Pulmonary Disease) Candyce Champagne, MD as Consulting Physician (General Surgery) Florencio Hunting, MD as Consulting Physician (Urology)  Inpatient Treatment Team: Treatment Team:  Candyce Champagne, MD Etter Hermann., MD Florencio Hunting, MD Pccm, Md, MD Clementine Cutting, MD Catheline Clos, RN Baron Border, MD Terre Ferri, MD Gwenevere Lent, RN Koonce, Natalie K, OT Van Fleet, Elizabeth B, PT Antonette Batters A, [place]   Problem List:   Principal Problem:   Delayed bowel perforation s/p SBR/end ileostomy Active Problems:   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 08/08/2017   S/P ileal conduit (HCC)   GERD (gastroesophageal reflux disease)   Obesity (BMI 35.0-39.9 without comorbidity)   Prolonged QT interval   Non-recurrent bilateral inguinal hernia without obstruction or gangrene   Incarcerated incisional hernia   Sinus tachycardia   Tachypnea   Acute respiratory insufficiency, postoperative   Sepsis due to undetermined organism (HCC)   Lactic acidosis   Class 2 obesity   Chronic anticoagulation   Hearing loss   History of bladder cancer   Obstructive sleep apnea of adult   Parastomal hernia of ileal conduit   Partial small bowel obstruction (HCC)   Personal history of PE (pulmonary embolism)   Pressure injury of skin   04/10/2024  POST-OPERATIVE DIAGNOSIS:  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   PROCEDURE:   -ROBOTIC LYSIS OF ADHESIONS X 4 HOURS -COMPONENT SEPARATION - TRANSVERSUS ABDOMINIS REALEASE (TAR) BILATERAL -ROBOTIC REPAIRS OF  INCISIONAL, PARASTOMAL, LEFT INGUINAL  INCARCERATED ABDOMINAL WALL HERNIAS WITH  MESH -UROSTOMY ILEAL CONDUIT REVISION -INTRAOPERATIVE ASSESSMENT OF TISSUE VASCULAR PERFUSION USING ICG (indocyanine green ) -IMMUNOFLUORESCENCE -TRANSVERSUS ABDOMINIS PLANE (TAP) BLOCK - BILATERAL    SURGEON:  Eddye Goodie, MD  OR FINDINGS:  Patient had dense adhesions anterior abdominal wall.  Largest hernia was in the right lower quadrant around the ileal conduit urostomy.  16 x 12 cm region.  Incarcerated with over a foot of small bowel.  Midline next largest 9 x 8 cm incarcerated with numerous loops of small bowel.  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  Another direct space hernia in the left lower quadrant and indirect hernia on the left lower quadrant.  No obvious hernias on the right side.   Component separation's done on both sides to try and get the largest midline and parastomal hernias close down with running serrated try to fix absorbable suture.  Broad underlay repair with 30 x 35 cm mesh transverse  04/20/2024 POST-OPERATIVE DIAGNOSIS:  perforated bowel   PROCEDURE:  Drainage of right abdominal wall abscess as well as intra-abdominal abscess Explantation of abdominal mesh Small bowel resection (in discontinuity) Placement of ABThera wound VAC   SURGEON:  Aldean Hummingbird, MD  ASSISTANTS: Adalberto Acton, MD   04/21/2024  POST-OPERATIVE DIAGNOSIS:  DELAYED BOWEL PERFORATION WITH OPEN ABDOMEN   PROCEDURE:   LYSIS OF ADHESIONS X ABDOMINAL WALL DEBRIDEMENT ILEAL RESECTION END ILEOSTOMY ABDOMINAL WALL PARASTOMAL & INCISIONAL HERNIA REPAIRS WITH PHASIX MESH FASCIA CLOSURE WITH WOUND VAC PLACEMENT IN SQ   SURGEON:  Eddye Goodie, MD   ASSISTANT:  [name], MD   Ostomy:  Right upper quadrant: End ileostomy Right lower quadrant: Urostomy ileal  conduit   Drains: 19 Fr Blake drains x2   Right lower quadrant drain rests in the right panniculus abdominal wall between the Phasix mesh and abdominal wall   Left lower quadrant drain  rest in the anterior peritoneum between the mesh and the bowel with the tip resting in the right lower quadrant near the base of the ileal conduit and prior bowel resection  Assessment [facility] Stay = 19 days) 8 Days Post-Op    Stabilizing but still very challenged  Plan:  Shock resolved.  Nutrition: Bowel function returned.  Tolerating tube feeds at goal.  Cycle tube feeds.  I prefer tube feeds at night so stomach is empty and he is more alert during the daytime.  No c/o pain nor difficulty w administrating.  Go next to 100 an hour x 14 hours.  Fiber to thicken ileostomy effluent -soluble polyfiber/FiberCon  Add loperamide to avoid too much high output ileostomy.  Weaned off TPN 6/16   PO as tolerated.  Concern with fair mental status, so we will postpone until better clear with speech therapy.  Follow  Hold off on G-tube unless poor PO effort >14days  Infection due to delayed bowel perforation and abdominal wall infection/necrosis  Growing multiple organisms including Candida albicans, Enterobacter, and Enterococcus.    IV Zosyn /micafungin .  Anticipate prolonged antibiotics given the abdominal wall infection/necrosis and need for Phasix mesh.    I have asked infectious disease (Dr Lynell Sar)  to help evaluate.  I asked them again to see  Discussed with Dr. Fulton Job with critical care and we both feel micafungin  more appropriate treatment for right now until C albicans sensitivities can be done.  Cardiac/rhythm issues with fluconazole .    Change wound vac q MonWedFri the hopes for the subcutaneous tissues to recover.  Last change Monday 6/16 looked pretty good without infection/necrosis = continue.  If progressive necrosis or concerns may need to switch to packing.    AKI in the setting of urostomy ileal conduit and shock.    Initially oliguric and now higher volume.    Intraperitoneal surgical Blake fluid creatinine 6/12 same as serum argues against leak.  Dr Rozanne Corners  aware  CRRTx vs intermittent HD to help clear uremia/wastes per nephrology  Metabolic encephalopathy with all the stresses he has been through, especially Uremia.    CRRTx to help help - seems better this AM  CT of chest negative for any obvious pulmonary embolism or pneumonia.  Oxygen needs less.  Echocardiogram shows grade 1 diastolic dysfunction which I believe is his baseline.  Defer to critical care/internal medicine they feel further is needed aside for some monitor diuresis.  ABLA on top of anemia of chronic disease improved with transfusion.  Follow.    -monitor electrolytes & replace as needed.  Keep K>4, Mg>2, Phos>3.    -Diabetes.  Sliding scale insulin .    -VTE prophylaxis-  Full anticoagulation heparin  drip given history of pulmonary embolism,.  Could consider back on DOAC enteral route once off CRRTx    I updated the patient's status to the the patient & ICU RN in room.  Recommendations were made.  Questions were answered.  They expressed understanding & appreciation.  -Disposition: He is going to be here a while       I reviewed nursing notes, last 24 h vitals and pain scores, last 48 h intake and output, last 24 h labs and trends, and last 24 h imaging results.  I have reviewed this patient's available data, including  medical history, events of note, test results, etc as part of my evaluation.   A significant portion of that time was spent in counseling. Care during the described time interval was provided by me.  This care required high  level of medical decision making.  04/29/2024    Subjective: (Chief complaint)  On CRRTx More alert & following commands Denies pain  Objective:  Vital signs:  Vitals:   04/29/24 0530 04/29/24 0600 04/29/24 0630 04/29/24 0700  BP:  (!) 121/55    Pulse: 86 83 80 91  Resp: (!) 21 20 18  (!) 22  Temp:      TempSrc:      SpO2: 100% 100% 100% 100%  Weight:      Height:        Last BM Date :  04/28/24  Intake/Output   Yesterday:  06/17 0701 - 06/18 0700 In: 2251.3 [I.V.:545.5; ZO/XW:9604; IV Piggyback:242.8] Out: 5409 [Urine:1215; Drains:303; Stool:940] This shift:  No intake/output data recorded.  Bowel function:  Flatus: YES  BM:  YES -small  Drains:  RLQ drain (rests between abdominal wall and Phasix mesh): Serous    LLQ drain (runs over bowel with tip down in RLQ pelvis near base of ileal conduit and site of bowel resection):  thinly serosanguinous   Wound vac (in SQ over closed fascia):  serosanguineous   Physical Exam:  General: Awake but not providing much eye contact.  Follows a few commands.  Answering simple questions.  Not agitated Eyes: PERRL, normal EOM.  Sclera clear.  No icterus Neuro: CN II-XII intact w/o focal sensory/motor deficits. Lymph: No head/neck/groin lymphadenopathy Psych:  No delerium/psychosis/paranoia.  No agitation HENT: Normocephalic, Mucus membranes moist.  No thrush.  ETT & NGT in place Neck: Supple, No tracheal deviation.  No obvious thyromegaly Chest: No pain to chest wall compression.  Good respiratory excursion.   CV:  Pulses intact.  Regular rhythm.  1-2+ BUE/BLE edema MS:  No obvious deformity  Abdomen:  Obese but Soft.  Nondistended.  Wound vac in midline.    Flank ecchymosis bilateral posterior resolving.  Edema along right flank and panniculus with less edema/erythema - improved.  RUQ: (End ileostomy): Tan oatmeal thick effluent in bag RLQ:  (Urostomy ileal conduit): pink with clear light yellow-colored urine  Ext:   No deformity.  Bilateral hand 2+ edema greater than lower extremity   No cyanosis Skin: No petechiae / purpurea.  No major sores.  Warm and dry    Results:   Cultures: Recent Results (from the past 720 hours)  Culture, blood (Routine X 2) w Reflex to ID Panel     Status: None   Collection Time: 04/13/24  7:13 AM   Specimen: BLOOD  Result Value Ref Range Status   Specimen Description   Final     BLOOD BLOOD LEFT ARM AEROBIC BOTTLE ONLY ANAEROBIC BOTTLE ONLY Performed at [facility], [address] [place], [place] [address]    Special Requests   Final    BOTTLES DRAWN AEROBIC AND ANAEROBIC Blood Culture results may not be optimal due to an inadequate volume of blood received in culture bottles Performed at [facility], [address], [place], [place] [address]    Culture   Final    NO GROWTH 5 DAYS Performed at [facility], [address], [place], [place] [address]    Report Status 04/18/2024 FINAL  Final  Culture, blood (Routine X 2) w Reflex to ID  Panel     Status: None   Collection Time: 04/13/24  7:20 AM   Specimen: BLOOD  Result Value Ref Range Status   Specimen Description   Final    BLOOD BLOOD RIGHT ARM AEROBIC BOTTLE ONLY ANAEROBIC BOTTLE ONLY Performed at [facility], [address], [place], [place] [address]    Special Requests   Final    BOTTLES DRAWN AEROBIC AND ANAEROBIC Blood Culture results may not be optimal due to an inadequate volume of blood received in culture bottles Performed at [facility], [address] [place], [place] [address]    Culture   Final    NO GROWTH 5 DAYS Performed at [facility], [address], [place], [place] [address]    Report Status 04/18/2024 FINAL  Final  Urine Culture     Status: None   Collection Time: 04/13/24 10:18 AM   Specimen: Urine, Clean Catch  Result Value Ref Range Status   Specimen Description   Final    URINE, CLEAN CATCH Performed at [facility], [address], [place], [place] [address]    Special Requests   Final    NONE Performed at [facility], [address], [place], [place] [address]    Culture   Final    NO GROWTH Performed at [facility], [address], [place], [place] [address]    Report Status 04/14/2024 FINAL  Final  MRSA Next Gen by PCR,  Nasal     Status: None   Collection Time: 04/13/24 11:35 AM   Specimen: Nasal Mucosa; Nasal Swab  Result Value Ref Range Status   MRSA by PCR Next Gen NOT DETECTED NOT DETECTED Final    Comment: (NOTE) The GeneXpert MRSA Assay (FDA approved for NASAL specimens only), is one component of a comprehensive MRSA colonization surveillance program. It is not intended to diagnose MRSA infection nor to guide or monitor treatment for MRSA infections. Test performance is not FDA approved in patients less than 5 years old. Performed at [facility], [address] [place], [place] [address]   Culture, blood (Routine X 2) w Reflex to ID Panel     Status: None   Collection Time: 04/20/24  8:46 AM   Specimen: BLOOD RIGHT ARM  Result Value Ref Range Status   Specimen Description   Final    BLOOD RIGHT ARM Performed at [facility], [address], [place], [place] [address]    Special Requests   Final    BOTTLES DRAWN AEROBIC AND ANAEROBIC Blood Culture results may not be optimal due to an inadequate volume of blood received in culture bottles Performed at [facility], [address], [place], [place] [address]    Culture   Final    NO GROWTH 5 DAYS Performed at [facility], [address] [place], [place] [address]    Report Status 04/25/2024 FINAL  Final  Culture, blood (Routine X 2) w Reflex to ID Panel     Status: None   Collection Time: 04/20/24  8:49 AM   Specimen: BLOOD  Result Value Ref Range Status   Specimen Description   Final    BLOOD Performed at [facility], [address] [place], [place] [address]    Special Requests   Final    BOTTLES DRAWN AEROBIC AND ANAEROBIC Blood Culture results may not be optimal due to an  inadequate volume of blood received in culture bottles Performed at [facility], [address] [place], [place] [address]    Culture   Final    NO GROWTH 5 DAYS Performed at  [facility], [address] [place], [place] [address]    Report Status 04/25/2024 FINAL  Final  Culture, Respiratory w Gram Stain     Status: None   Collection Time: 04/20/24 12:03 PM   Specimen: Tracheal Aspirate; Respiratory  Result Value Ref Range Status   Specimen Description   Final    TRACHEAL ASPIRATE Performed at [facility], [address] [place], [place] [address]    Special Requests   Final    NONE Performed at [facility], [address] [place], [place] [address]    Gram Stain NO WBC SEEN RARE GRAM POSITIVE COCCI   Final   Culture   Final    RARE Normal respiratory flora-no Staph aureus or Pseudomonas seen Performed at [facility], [address] [place], [place] [address]    Report Status 04/23/2024 FINAL  Final  Aerobic/Anaerobic Culture w Gram Stain (surgical/deep wound)     Status: None   Collection Time: 04/21/24  2:23 PM   Specimen: Soft Tissue, Other  Result Value Ref Range Status   Specimen Description   Final    TISSUE INTRA ABDOMINAL NECROTIC TISSUE Performed at [facility], [address], [place], [place] [address]    Special Requests   Final    NONE Performed at [facility], [address], [place], [place] [address]    Gram Stain   Final    FEW WBC PRESENT,BOTH PMN AND MONONUCLEAR RARE GRAM POSITIVE COCCI IN PAIRS AND CHAINS RARE YEAST    Culture   Final    RARE ESCHERICHIA COLI FEW CANDIDA ALBICANS FEW ENTEROCOCCUS FAECALIS FEW BACTEROIDES SPECIES BETA LACTAMASE POSITIVE Performed at [facility], [address], [place], [place] [address]    Report Status 04/25/2024 FINAL  Final   Organism ID, Bacteria ESCHERICHIA COLI  Final   Organism ID, Bacteria ENTEROCOCCUS FAECALIS  Final      Susceptibility   Escherichia coli - MIC*    AMPICILLIN >=32 RESISTANT Resistant     CEFEPIME  <=0.12 SENSITIVE Sensitive     CEFTAZIDIME <=1 SENSITIVE  Sensitive     CEFTRIAXONE  1 SENSITIVE Sensitive     CIPROFLOXACIN  <=0.25 SENSITIVE Sensitive     GENTAMICIN  <=1 SENSITIVE Sensitive     IMIPENEM <=0.25 SENSITIVE Sensitive     TRIMETH /SULFA  <=20 SENSITIVE Sensitive     AMPICILLIN/SULBACTAM >=32 RESISTANT Resistant     PIP/TAZO 8 SENSITIVE Sensitive ug/mL    * RARE ESCHERICHIA COLI   Enterococcus faecalis - MIC*    AMPICILLIN <=2 SENSITIVE Sensitive     VANCOMYCIN  1 SENSITIVE Sensitive     GENTAMICIN  SYNERGY SENSITIVE Sensitive     * FEW ENTEROCOCCUS FAECALIS    Labs: Results for orders placed or performed during the hospital encounter of 04/10/24 (from the past 48 hours)  Glucose, capillary     Status: Abnormal   Collection Time: 04/27/24  8:07 AM  Result Value Ref Range   Glucose-Capillary 179 (H) 70 - 99 mg/dL    Comment: Glucose reference range applies only to samples taken after fasting for at least 8 hours.  Glucose, capillary     Status: Abnormal   Collection Time: 04/27/24 11:53 AM  Result Value  Ref Range   Glucose-Capillary 153 (H) 70 - 99 mg/dL    Comment: Glucose reference range applies only to samples taken after fasting for at least 8 hours.  Glucose, capillary     Status: Abnormal   Collection Time: 04/27/24  4:23 PM  Result Value Ref Range   Glucose-Capillary 100 (H) 70 - 99 mg/dL    Comment: Glucose reference range applies only to samples taken after fasting for at least 8 hours.  Glucose, capillary     Status: Abnormal   Collection Time: 04/27/24  7:26 PM  Result Value Ref Range   Glucose-Capillary 149 (H) 70 - 99 mg/dL    Comment: Glucose reference range applies only to samples taken after fasting for at least 8 hours.  Glucose, capillary     Status: Abnormal   Collection Time: 04/27/24 11:43 PM  Result Value Ref Range   Glucose-Capillary 151 (H) 70 - 99 mg/dL    Comment: Glucose reference range applies only to samples taken after fasting for at least 8 hours.  Glucose, capillary     Status: Abnormal    Collection Time: 04/28/24  3:38 AM  Result Value Ref Range   Glucose-Capillary 143 (H) 70 - 99 mg/dL    Comment: Glucose reference range applies only to samples taken after fasting for at least 8 hours.  Renal function panel (daily at 0500)     Status: Abnormal   Collection Time: 04/28/24  4:41 AM  Result Value Ref Range   Sodium 138 135 - 145 mmol/L   Potassium 3.9 3.5 - 5.1 mmol/L   Chloride 102 98 - 111 mmol/L   CO2 23 22 - 32 mmol/L   Glucose, Bld 154 (H) 70 - 99 mg/dL    Comment: Glucose reference range applies only to samples taken after fasting for at least 8 hours.   BUN 107 (H) 8 - 23 mg/dL   Creatinine, Ser 1.61 (H) 0.61 - 1.24 mg/dL   Calcium  8.1 (L) 8.9 - 10.3 mg/dL   Phosphorus 5.1 (H) 2.5 - 4.6 mg/dL   Albumin  2.1 (L) 3.5 - 5.0 g/dL   GFR, Estimated 22 (L) >60 mL/min    Comment: (NOTE) Calculated using the CKD-EPI Creatinine Equation (2021)    Anion gap 13 5 - 15    Comment: Performed at [facility], [address] [place], [place] [address]  Magnesium      Status: None   Collection Time: 04/28/24  4:41 AM  Result Value Ref Range   Magnesium  2.2 1.7 - 2.4 mg/dL    Comment: Performed at [facility], [address] [place], [place] [address]  CBC     Status: Abnormal   Collection Time: 04/28/24  4:41 AM  Result Value Ref Range   WBC 17.9 (H) 4.0 - 10.5 K/uL   RBC 3.04 (L) 4.22 - 5.81 MIL/uL   Hemoglobin 8.7 (L) 13.0 - 17.0 g/dL   HCT 11.9 (L) 14.7 - 82.9 %   MCV 89.5 80.0 - 100.0 fL   MCH 28.6 26.0 - 34.0 pg   MCHC 32.0 30.0 - 36.0 g/dL   RDW 56.2 (H) 13.0 - 86.5 %   Platelets 329 150 - 400 K/uL   nRBC 0.1 0.0 - 0.2 %    Comment: Performed at [facility], [address] [place], [place] [address]  Heparin  level (unfractionated)     Status: None   Collection Time: 04/28/24  4:41 AM  Result Value Ref  Range   Heparin  Unfractionated 0.42 0.30 - 0.70 IU/mL    Comment: (NOTE) The clinical reportable range upper limit is being lowered to  >1.10 to align with the FDA approved guidance for the current laboratory assay.  If heparin  results are below expected values, and patient dosage has  been confirmed, suggest follow up testing of antithrombin III levels. Performed at [facility], [address] [place], [place] [address]   Glucose, capillary     Status: None   Collection Time: 04/28/24  8:01 AM  Result Value Ref Range   Glucose-Capillary 81 70 - 99 mg/dL    Comment: Glucose reference range applies only to samples taken after fasting for at least 8 hours.  Glucose, capillary     Status: None   Collection Time: 04/28/24 10:26 AM  Result Value Ref Range   Glucose-Capillary 99 70 - 99 mg/dL    Comment: Glucose reference range applies only to samples taken after fasting for at least 8 hours.  Glucose, capillary     Status: Abnormal   Collection Time: 04/28/24 11:23 AM  Result Value Ref Range   Glucose-Capillary 122 (H) 70 - 99 mg/dL    Comment: Glucose reference range applies only to samples taken after fasting for at least 8 hours.  Glucose, capillary     Status: Abnormal   Collection Time: 04/28/24  3:30 PM  Result Value Ref Range   Glucose-Capillary 118 (H) 70 - 99 mg/dL    Comment: Glucose reference range applies only to samples taken after fasting for at least 8 hours.  Renal function panel (daily at 1600)     Status: Abnormal   Collection Time: 04/28/24  6:21 PM  Result Value Ref Range   Sodium 138 135 - 145 mmol/L   Potassium 4.4 3.5 - 5.1 mmol/L   Chloride 103 98 - 111 mmol/L   CO2 22 22 - 32 mmol/L   Glucose, Bld 207 (H) 70 - 99 mg/dL    Comment: Glucose reference range applies only to samples taken after fasting for at least 8 hours.   BUN 118 (H) 8 - 23 mg/dL   Creatinine, Ser 0.98 (H) 0.61 - 1.24 mg/dL   Calcium  8.1 (L) 8.9 - 10.3 mg/dL   Phosphorus 6.0 (H) 2.5 - 4.6 mg/dL   Albumin  2.1 (L) 3.5 - 5.0 g/dL   GFR, Estimated 19 (L) >60 mL/min    Comment: (NOTE) Calculated using the CKD-EPI  Creatinine Equation (2021)    Anion gap 13 5 - 15    Comment: Performed at [facility], [address], [place], [place] [address]  Glucose, capillary     Status: Abnormal   Collection Time: 04/28/24  7:57 PM  Result Value Ref Range   Glucose-Capillary 205 (H) 70 - 99 mg/dL    Comment: Glucose reference range applies only to samples taken after fasting for at least 8 hours.  Glucose, capillary     Status: Abnormal   Collection Time: 04/28/24 11:34 PM  Result Value Ref Range   Glucose-Capillary 182 (H) 70 - 99 mg/dL    Comment: Glucose reference range applies only to samples taken after fasting for at least 8 hours.  Glucose, capillary     Status: Abnormal   Collection Time: 04/29/24  3:44 AM  Result Value Ref Range   Glucose-Capillary 156 (H) 70 - 99 mg/dL    Comment: Glucose reference range applies only to samples taken after fasting for at least 8  hours.  CBC     Status: Abnormal   Collection Time: 04/29/24  3:55 AM  Result Value Ref Range   WBC 12.9 (H) 4.0 - 10.5 K/uL   RBC 2.97 (L) 4.22 - 5.81 MIL/uL   Hemoglobin 8.4 (L) 13.0 - 17.0 g/dL   HCT 16.1 (L) 09.6 - 04.5 %   MCV 89.2 80.0 - 100.0 fL   MCH 28.3 26.0 - 34.0 pg   MCHC 31.7 30.0 - 36.0 g/dL   RDW 40.9 (H) 81.1 - 91.4 %   Platelets 305 150 - 400 K/uL   nRBC 0.0 0.0 - 0.2 %    Comment: Performed at [facility], [address], [place], [place] [address]  Magnesium      Status: None   Collection Time: 04/29/24  3:55 AM  Result Value Ref Range   Magnesium  2.3 1.7 - 2.4 mg/dL    Comment: Performed at [facility], [address] [place], [place] [address]  Heparin  level (unfractionated)     Status: None   Collection Time: 04/29/24  3:56 AM  Result Value Ref Range   Heparin  Unfractionated 0.48 0.30 - 0.70 IU/mL    Comment: (NOTE) The clinical reportable range upper limit is being lowered to >1.10 to align with the FDA approved guidance for the current laboratory assay.  If heparin  results are  below expected values, and patient dosage has  been confirmed, suggest follow up testing of antithrombin III levels. Performed at [facility], [address] [place], [place] [address]   Renal function panel (daily at 0500)     Status: Abnormal   Collection Time: 04/29/24  3:56 AM  Result Value Ref Range   Sodium 139 135 - 145 mmol/L   Potassium 4.3 3.5 - 5.1 mmol/L   Chloride 104 98 - 111 mmol/L   CO2 23 22 - 32 mmol/L   Glucose, Bld 164 (H) 70 - 99 mg/dL    Comment: Glucose reference range applies only to samples taken after fasting for at least 8 hours.   BUN 88 (H) 8 - 23 mg/dL   Creatinine, Ser 4.69 (H) 0.61 - 1.24 mg/dL   Calcium  8.0 (L) 8.9 - 10.3 mg/dL   Phosphorus 3.7 2.5 - 4.6 mg/dL   Albumin  2.1 (L) 3.5 - 5.0 g/dL   GFR, Estimated 27 (L) >60 mL/min    Comment: (NOTE) Calculated using the CKD-EPI Creatinine Equation (2021)    Anion gap 12 5 - 15    Comment: Performed at [facility], [address] [place], [place] [address]    Imaging / Studies: DG Abd Portable 1V Result Date: 04/27/2024 CLINICAL DATA:  Feeding tube placement EXAM: PORTABLE ABDOMEN - 1 VIEW COMPARISON:  04/15/2024 FINDINGS: Frontal view of the lower chest and upper abdomen demonstrates enteric catheter tip projecting over the gastric body. Bowel gas pattern is unremarkable. No acute bony abnormalities. IMPRESSION: 1. Enteric catheter tip projecting over the gastric body. Electronically Signed   By: Bobbye Burrow M.D.   On: 04/27/2024 15:11      Medications / Allergies: per chart  Antibiotics: Anti-infectives (From admission, onward)    Start     Dose/Rate Route Frequency Ordered Stop   04/29/24 1000  micafungin  (MYCAMINE ) 150 mg in sodium chloride  0.9 % 100 mL IVPB        150 mg 107.5 mL/hr over 1 Hours Intravenous Every 24 hours 04/28/24 1036     04/28/24 1345  micafungin  (  MYCAMINE ) 100 mg in sodium chloride  0.9 % 100 mL IVPB        100 mg 105 mL/hr over 1 Hours Intravenous   Once 04/28/24 1245 04/28/24 1354   04/28/24 1000  micafungin  (MYCAMINE ) 100 mg in sodium chloride  0.9 % 100 mL IVPB  Status:  Discontinued        100 mg 105 mL/hr over 1 Hours Intravenous Every 24 hours 04/27/24 1219 04/28/24 1036   04/21/24 1400  clindamycin  (CLEOCIN ) 900 mg, gentamicin  (GARAMYCIN ) 240 mg in sodium chloride  0.9 % 1,000 mL for intraperitoneal lavage  Status:  Discontinued         Irrigation To Surgery 04/21/24 1346 04/21/24 1618   04/20/24 1400  piperacillin -tazobactam (ZOSYN ) IVPB 3.375 g        3.375 g 12.5 mL/hr over 240 Minutes Intravenous Every 8 hours 04/20/24 0755     04/20/24 1000  metroNIDAZOLE  (FLAGYL ) IVPB 500 mg  Status:  Discontinued        500 mg 100 mL/hr over 60 Minutes Intravenous Every 12 hours 04/20/24 0320 04/20/24 0752   04/20/24 1000  micafungin  (MYCAMINE ) 150 mg in sodium chloride  0.9 % 100 mL IVPB  Status:  Discontinued        150 mg 107.5 mL/hr over 1 Hours Intravenous Every 24 hours 04/20/24 0752 04/27/24 1219   04/20/24 0245  ceFEPIme  (MAXIPIME ) 2 g in sodium chloride  0.9 % 100 mL IVPB  Status:  Discontinued        2 g 200 mL/hr over 30 Minutes Intravenous Every 8 hours 04/20/24 0232 04/20/24 0752   04/20/24 0100  metroNIDAZOLE  (FLAGYL ) IVPB 500 mg        500 mg 100 mL/hr over 60 Minutes Intravenous On call to O.R. 04/20/24 0004 04/20/24 0100   04/14/24 1800  ceFEPIme  (MAXIPIME ) 2 g in sodium chloride  0.9 % 100 mL IVPB        2 g 200 mL/hr over 30 Minutes Intravenous Every 8 hours 04/14/24 1003 04/19/24 0957   04/14/24 1600  vancomycin  (VANCOCIN ) IVPB 1000 mg/200 mL premix  Status:  Discontinued        1,000 mg 200 mL/hr over 60 Minutes Intravenous Every 24 hours 04/13/24 1420 04/14/24 1000   04/14/24 1600  vancomycin  (VANCOREADY) IVPB 1500 mg/300 mL  Status:  Discontinued        1,500 mg 150 mL/hr over 120 Minutes Intravenous Every 24 hours 04/14/24 1002 04/16/24 0744   04/14/24 1200  vancomycin  (VANCOCIN ) IVPB 1000 mg/200 mL premix  Status:   Discontinued        1,000 mg 200 mL/hr over 60 Minutes Intravenous Every 24 hours 04/13/24 1053 04/13/24 1109   04/13/24 2200  ceFEPIme  (MAXIPIME ) 2 g in sodium chloride  0.9 % 100 mL IVPB  Status:  Discontinued        2 g 200 mL/hr over 30 Minutes Intravenous Every 12 hours 04/13/24 1036 04/13/24 1109   04/13/24 2200  ceFEPIme  (MAXIPIME ) 2 g in sodium chloride  0.9 % 100 mL IVPB  Status:  Discontinued        2 g 200 mL/hr over 30 Minutes Intravenous Every 12 hours 04/13/24 1425 04/14/24 1003   04/13/24 1500  vancomycin  (VANCOCIN ) 2,000 mg in sodium chloride  0.9 % 500 mL IVPB        2,000 mg 260 mL/hr over 120 Minutes Intravenous  Once 04/13/24 1409 04/13/24 1850   04/13/24 1500  ceFEPIme  (MAXIPIME ) 2 g in sodium chloride  0.9 % 100 mL IVPB  Status:  Discontinued        2 g 200 mL/hr over 30 Minutes Intravenous Every 12 hours 04/13/24 1420 04/13/24 1425   04/13/24 1500  metroNIDAZOLE  (FLAGYL ) IVPB 500 mg  Status:  Discontinued        500 mg 100 mL/hr over 60 Minutes Intravenous Every 12 hours 04/13/24 1420 04/17/24 0725   04/13/24 1200  vancomycin  (VANCOCIN ) 2,000 mg in sodium chloride  0.9 % 500 mL IVPB  Status:  Discontinued        2,000 mg 260 mL/hr over 120 Minutes Intravenous  Once 04/13/24 1052 04/13/24 1121   04/13/24 1100  metroNIDAZOLE  (FLAGYL ) IVPB 500 mg  Status:  Discontinued        500 mg 100 mL/hr over 60 Minutes Intravenous 2 times daily 04/13/24 1007 04/13/24 1109   04/13/24 1100  vancomycin  (VANCOCIN ) IVPB 1000 mg/200 mL premix  Status:  Discontinued        1,000 mg 200 mL/hr over 60 Minutes Intravenous  Once 04/13/24 1007 04/13/24 1020   04/13/24 1015  ceFEPIme  (MAXIPIME ) 2 g in sodium chloride  0.9 % 100 mL IVPB        2 g 200 mL/hr over 30 Minutes Intravenous STAT 04/13/24 1007 04/14/24 1721   04/10/24 2200  ceFAZolin  (ANCEF ) IVPB 2g/100 mL premix        2 g 200 mL/hr over 30 Minutes Intravenous Every 8 hours 04/10/24 1833 04/11/24 0531   04/10/24 0600  ceFAZolin   (ANCEF ) IVPB 2g/100 mL premix        2 g 200 mL/hr over 30 Minutes Intravenous On call to O.R. 04/10/24 0533 04/10/24 1526         Note: Portions of this report may have been transcribed using voice recognition software. Every effort was made to ensure accuracy; however, inadvertent computerized transcription errors may be present.   Any transcriptional errors that result from this process are unintentional.    Eddye Goodie, MD, FACS, MASCRS Esophageal, Gastrointestinal & Colorectal Surgery Robotic and Minimally Invasive Surgery  [facility] [address] [place], [place] [address] [phone] Fax [phone] Main  CONTACT INFORMATION: Weekday (9AM-5PM): Call [facility] main office at [phone] Weeknight (5PM-9AM) or Weekend/Holiday: Check EPIC Web Links tab & use AMION (password  [email or web address]) for General Surgery [facility] coverage  Please, DO NOT use SecureChat  (it is not reliable communication to reach operating surgeons & will lead to a delay in care).   Epic staff messaging available for outptient concerns needing 1-2 business day response.      04/29/2024  7:18 AM

## 2024-04-29 NOTE — Progress Notes (Signed)
 PHARMACY - ANTICOAGULATION CONSULT NOTE  Pharmacy Consult for Heparin  Indication: h/o PE  Allergies  Allergen Reactions   Demerol  [Meperidine ] Nausea And Vomiting and Other (See Comments)    SEVERE NAUSEA    Patient Measurements: Height: 5' 11 (180.3 cm) Weight: 108.3 kg (238 lb 12.1 oz) IBW/kg (Calculated) : 75.3 HEPARIN  DW (KG): 100.8  Vital Signs: Temp: 98.4 F (36.9 C) (06/18 0746) Temp Source: Axillary (06/18 0746) BP: 117/51 (06/18 0800) Pulse Rate: 81 (06/18 0800)  Labs: Recent Labs    04/27/24 0453 04/28/24 0441 04/28/24 1821 04/29/24 0355 04/29/24 0356  HGB 8.8* 8.7*  --  8.4*  --   HCT 27.7* 27.2*  --  26.5*  --   PLT 373 329  --  305  --   HEPARINUNFRC 0.46 0.42  --   --  0.48  CREATININE 3.99*  3.87* 3.00* 3.30*  --  2.51*    Estimated Creatinine Clearance: 33.8 mL/min (A) (by C-G formula based on SCr of 2.51 mg/dL (H)).   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   moderate atypia - right low back   Atypical nevus 04/04/2007   moderate to marked - right upper back (wider shave)   Atypical nevus 04/04/2007   moderate atypia - center chest (wider shave)   Atypical nevus 04/04/2007   slight atypia - right thigh   Atypical nevus 11/29/2011   mild atypia - center upper back   Atypical nevus 11/29/2011   mild atypia - center chest   Bacteremia due to Klebsiella pneumoniae 10/09/2017   Bladder cancer (HCC) dx 07/2017   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 denies   Sleep apnea    cpap   Squamous cell carcinoma of skin 05/22/2013   left cheek - CX3 + 5FU   Wears glasses     Assessment:  Patient with prolonged hospitalization including post-op complications resulting in bowel perforation with open abdomen. Has been closed and transferred to [facility]. On Eliquis PTA for hx of PE. Has been on heparin  gtt since 6/6.   AM heparin  level therapeutic at 0.48, HgB 8.4, and PLTs 305. No issues noted.   Goal of Therapy:  Heparin  level 0.3-0.7 units/ml Monitor platelets by anticoagulation protocol: Yes   Plan:  Continue heparin  at 1900u/hr.  Monitor for s/sx of bleeding.  Monitor CBC and heparin  level daily.  F/u in next few days about ability to transition back to DOAC. Awaiting improvement in renal fxn and mental status.    Mamie Searles, PharmD, BCCCP  04/29/2024 8:34 AM

## 2024-04-29 NOTE — NC FL2 (Signed)
 [place]  MEDICAID FL2 LEVEL OF CARE FORM     IDENTIFICATION  Patient Name: Kristopher Thompson Birthdate: [birth year] Sex: male Admission Date (Current Location): 04/10/2024  [facility] and [place] Number:  Producer, television/film/video and Address:  [facility]. [facility], [address] [place], [place] [address]      Provider Number: [id number]  Attending Physician Name and Address:  Candyce Champagne, MD  Relative Name and Phone Number:  Saketh Daubert; Spouse; [phone]    Current Level of Care: Hospital Recommended Level of Care: Skilled Nursing Facility Prior Approval Number:    Date Approved/Denied:   PASRR Number:    Discharge Plan: SNF    Current Diagnoses: Patient Active Problem List   Diagnosis Date Noted   Delayed bowel perforation s/p SBR/end ileostomy 04/29/2024   Pressure injury of skin 04/29/2024   Sinus tachycardia 04/13/2024   Tachypnea 04/13/2024   Acute respiratory insufficiency, postoperative 04/13/2024   Sepsis due to undetermined organism (HCC) 04/13/2024   Lactic acidosis 04/13/2024   Class 2 obesity 04/13/2024   Incarcerated incisional hernia 04/10/2024   Chronic anticoagulation 03/02/2024   Partial small bowel obstruction (HCC) 03/02/2024   Personal history of PE (pulmonary embolism) 03/02/2024   Irritant contact dermatitis associated with urinary stoma 05/28/2023   Non-recurrent bilateral inguinal hernia without obstruction or gangrene 05/28/2023   Obstructive sleep apnea of adult 11/21/2022   Post-traumatic arthritis of ankle, 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   Hearing loss 07/24/2021   History of bladder cancer 07/24/2021   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   Prolonged QT interval 12/07/2018   AKI (acute kidney injury) (HCC) 06/15/2018   Fever 10/09/2017   Sepsis (HCC) 10/04/2017   GERD (gastroesophageal reflux disease) 08/11/2017   Obesity (BMI 35.0-39.9 without comorbidity) 08/11/2017   S/P ileal conduit (HCC) 08/08/2017   Bladder  cancer s/p cystectomy & ileal conduit 08/08/2017 08/08/2017    Orientation RESPIRATION BLADDER Height & Weight     Self, Place  O2 (3L nasal cannula) Incontinent Weight: 238 lb 12.1 oz (108.3 kg) Height:  5' 11 (180.3 cm)  BEHAVIORAL SYMPTOMS/MOOD NEUROLOGICAL BOWEL NUTRITION STATUS      Incontinent, Ileostomy Diet (Please see discharge summary)  AMBULATORY STATUS COMMUNICATION OF NEEDS Skin   Extensive Assist   Other (Comment), PU Stage and Appropriate Care, Wound Vac (Closed System Drain 1 Righ and Leftt;Lateral Abdomen Bulb (JP) 19 Fr.; Incision - 6 Ports Abdomen Left;Lateral Left;Mid Right;Lateral Mid;  Pressure Injury Sacrum Mid Stage 2; Negative Pressure Wound Therapy Abdomen Medial;Upper)                       Personal Care Assistance Level of Assistance  Bathing, Feeding, Dressing Bathing Assistance: Limited assistance Feeding assistance: Limited assistance Dressing Assistance: Limited assistance     Functional Limitations Info  Sight, Hearing Sight Info: Impaired (R and L) Hearing Info: Impaired (R and L)      SPECIAL CARE FACTORS FREQUENCY  PT (By licensed PT), OT (By licensed OT), Speech therapy     PT Frequency: 5x OT Frequency: 5x     Speech Therapy Frequency: 3x      Contractures Contractures Info: Not present    Additional Factors Info  Code Status, Allergies, Insulin  Sliding Scale Code Status Info: Full Code Allergies Info: Demerol  (Meperidine )   Insulin  Sliding Scale Info: Please see discharge summary       Current Medications (04/29/2024):  This is the current hospital active medication list Current Facility-Administered Medications  Medication Dose Route Frequency Provider Last Rate Last Admin   0.9 %  sodium chloride  infusion  250 mL Intravenous PRN Candyce Champagne, MD 10 mL/hr at 04/29/24 1400 Infusion Verify at 04/29/24 1400   acetaminophen  (TYLENOL ) suppository 650 mg  650 mg Rectal Q6H PRN Candyce Champagne, MD       acetaminophen  (TYLENOL )  tablet 325-650 mg  325-650 mg Per Tube Q6H PRN Candyce Champagne, MD       albumin  human 25 % solution 25 g  25 g Intravenous PRN Baron Border, MD       artificial tears ophthalmic solution 1 drop  1 drop Both Eyes PRN Dewald, Jonathan B, MD       Chlorhexidine  Gluconate Cloth 2 % PADS 6 each  6 each Topical Q2200 Candyce Champagne, MD   6 each at 04/28/24 2130   Chlorhexidine  Gluconate Cloth 2 % PADS 6 each  6 each Topical Q0600 Baron Border, MD   6 each at 04/29/24 1210   feeding supplement (PROSource TF20) liquid 60 mL  60 mL Per Tube BID Candyce Champagne, MD   60 mL at 04/29/24 0957   feeding supplement (VITAL 1.5 CAL) liquid 1,440 mL  1,440 mL Per Tube Q24H Candyce Champagne, MD       fentaNYL  (SUBLIMAZE ) injection 25 mcg  25 mcg Intravenous Q2H PRN Olalere, Adewale A, MD   25 mcg at 04/27/24 1458   heparin  ADULT infusion 100 units/mL (25000 units/250mL)  1,900 Units/hr Intravenous Continuous Candyce Champagne, MD 19 mL/hr at 04/29/24 1400 1,900 Units/hr at 04/29/24 1400   heparin  injection 1,000-6,000 Units  1,000-6,000 Units CRRT PRN Levorn Reason, MD       hydrALAZINE  (APRESOLINE ) injection 20 mg  20 mg Intravenous Q4H PRN Olalere, Adewale A, MD   20 mg at 04/27/24 0135   insulin  aspart (novoLOG ) injection 0-20 Units  0-20 Units Subcutaneous Q4H Gross, Steven, MD   4 Units at 04/29/24 0759   insulin  aspart (novoLOG ) injection 6 Units  6 Units Subcutaneous 4 times per day Synthia Ewing, [facility]       insulin  glargine-yfgn (SEMGLEE ) injection 25 Units  25 Units Subcutaneous QHS Mamie Searles W, RPH       loperamide HCl (IMODIUM) 1 MG/7.5ML suspension 2 mg  2 mg Per Tube QHS Gross, Steven, MD       magic mouthwash  15 mL Oral QID PRN Candyce Champagne, MD       menthol -cetylpyridinium (CEPACOL) lozenge 3 mg  1 lozenge Oral PRN Candyce Champagne, MD       micafungin  (MYCAMINE ) 150 mg in sodium chloride  0.9 % 100 mL IVPB  150 mg Intravenous Q24H Synthia Ewing, [facility]   Stopped at 04/29/24 1142    naphazoline-glycerin  (CLEAR EYES REDNESS) ophth solution 1-2 drop  1-2 drop Both Eyes QID PRN Candyce Champagne, MD       nutrition supplement (JUVEN) (JUVEN) powder packet 1 packet  1 packet Per Tube BID BM Joesph Mussel, DO   1 packet at 04/29/24 1352   Oral care mouth rinse  15 mL Mouth Rinse PRN Candyce Champagne, MD       Oral care mouth rinse  15 mL Mouth Rinse 4 times per day Olalere, Adewale A, MD   15 mL at 04/29/24 1200   oxyCODONE  (Oxy IR/ROXICODONE ) immediate release tablet 5-10 mg  5-10 mg Per Tube Q4H PRN Candyce Champagne, MD   10 mg at 04/28/24 1828   phenol (CHLORASEPTIC) mouth  spray 2 spray  2 spray Mouth/Throat PRN Candyce Champagne, MD       piperacillin -tazobactam (ZOSYN ) IVPB 3.375 g  3.375 g Intravenous Q8H Gross, Steven, MD 12.5 mL/hr at 04/29/24 1400 Infusion Verify at 04/29/24 1400   polycarbophil (FIBERCON) tablet 625 mg  625 mg Per Tube BID Candyce Champagne, MD   625 mg at 04/29/24 1610   prismasol  BGK 4/2.5 infusion   CRRT Continuous Levorn Reason, MD 400 mL/hr at 04/29/24 0334 New Bag at 04/29/24 0334   prismasol  BGK 4/2.5 infusion   CRRT Continuous Levorn Reason, MD 400 mL/hr at 04/29/24 0336 New Bag at 04/29/24 0336   prismasol  BGK 4/2.5 infusion   CRRT Continuous Levorn Reason, MD 2,000 mL/hr at 04/29/24 1346 New Bag at 04/29/24 1346   sodium chloride  (OCEAN) 0.65 % nasal spray 1-2 spray  1-2 spray Each Nare Q6H PRN Candyce Champagne, MD       sodium chloride  0.9 % primer fluid for CRRT   CRRT PRN Levorn Reason, MD       sodium chloride  flush (NS) 0.9 % injection 10-40 mL  10-40 mL Intracatheter PRN Candyce Champagne, MD         Discharge Medications: Please see discharge summary for a list of discharge medications.  Relevant Imaging Results:  Relevant Lab Results:   Additional Information SS# [id number]  Juliane Och, LCSW

## 2024-04-29 NOTE — Progress Notes (Signed)
 NAME:  Kristopher Thompson, MRN:  [id number], DOB:  [birth year], LOS: 19 ADMISSION DATE:  04/10/2024, CONSULTATION DATE:  04/13/2024 REFERRING MD:  Dr Bonita Bussing, CHIEF COMPLAINT: Sepsis  History of Present Illness:  S/p repair of incisional/parastomal and ventral hernias, left inguinal incarcerated , abdominal hernias, lysis of adhesions, urostomy revision Day 1 postoperatively  Transferred to the ICU secondary to tachycardia, tachypnea  History of bladder cancer, prostate cancer, diastolic heart failure, diabetes, right bundle branch block, multiple abdominal surgeries  Update 04/20/24: ccm reconsulted post operatively after pt had perforated bowel and returned to OR. Pt is requiring neo and norepi at this time. He remains intubated at this time. Profoundly acidotic with bicarb of 12. Will start sodium bicarb infusion and bolus prior to initiation. Follow labs in am. Updated wife and son at bedside.   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   moderate atypia - right low back   Atypical nevus 04/04/2007   moderate to marked - right upper back (wider shave)   Atypical nevus 04/04/2007   moderate atypia - center chest (wider shave)   Atypical nevus 04/04/2007   slight atypia - right thigh   Atypical nevus 11/29/2011   mild atypia - center upper back   Atypical nevus 11/29/2011   mild atypia - center chest   Bacteremia due to Klebsiella pneumoniae 10/09/2017   Bladder cancer (HCC) dx 07/2017   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 denies   Sleep apnea    cpap   Squamous cell carcinoma of skin 05/22/2013   left cheek - CX3 + 5FU   Wears glasses    Significant Hospital Events: Including procedures, antibiotic start and stop dates in addition to other pertinent events   04/10/2024-laparoscopic surgery 04/13/24 CCM consult. Incr NE. Art line  6/3 weaning NE. Gently diuresed w 3L off  6/4 off NE. Pulled out his NGT refused replacement. After multiple emesis  6/5 cont NGT 6/6 tx to med surg 6/9: taken back to OR for perforated bowel, post-op shock 6/10: Norepinephrine , Phenylephrine , and vasopressin , added steroids, 1u PRBCs, back to OR for washout and closure 6/11: Worsening AKI with fluid overload, start CRRT, decreasing pressor requirements 6/13 tolerating CRRT 6/14: Off CRRT, arousable.  Tolerated weaning for most of the day 6/13 6/14: Extubated, CRRT stopped. 6/15 transferred to [facility] for iHD; montoring for renal recovery 6/16 iHD, severely encephalopathic with high BUN 6/17 restarted CRRT for to ongoing uremia and encephalopathy  Interim History / Subjective:  Remains on CRRT.  Objective    Blood pressure (!) 121/55, pulse 91, temperature 98.2 F (36.8 C), temperature source Oral, resp. rate (!) 22, height 5' 11 (1.803 m), weight 108.3 kg,  SpO2 100%.        Intake/Output Summary (Last 24 hours) at 04/29/2024 0726 Last data filed at 04/29/2024 0700 Gross per 24 hour  Intake 2251.33 ml  Output 3793 ml  Net -1541.67 ml   Filed Weights   04/27/24 1350 04/28/24 0500 04/29/24 0411  Weight: 110 kg 107.9 kg 108.3 kg    Examination: General: ill appearing man lying in bed in NAD neuro: awake, alert, tracking, not following commands. Reached out his hand towards mine when I walked in, but not  answering questions.  HEENT: [place]/AT, eyes anicteric, cortrak RESP: breathing comfortably on Groom, faint rhonchi on left CV: S1S2, RRR GI: soft, NT. Diverting ileostomy GU: urostomy with clear yellow urine Skin: bruising on arms Extremities: slightly improved edema   BUN 88 Cr 2.51 Phos 3.7 WBC 12.9 H/H  8.4/26.5  Platelets 305   Resolved problem list    Assessment and Plan   Robotic hernia repair-- incisional, parastomal, left inguinal Septic shock due to acute bowel perforation - OR 6/9, back to the OR 6/10 for closure and ileal resection and end ileostomy creation -con't prolonged course of antimicrobials due to need for mesh in contaminated abdomen. ID consulted per surgery, but no notes from them so far -Wean off TPN & tolerating full dose TF- cycling down to 14 hrs per day per surgery -stop steroids today -remains off pressors  Acute hypoxemic respiratory failure - Extubated 6/14; mental status still makes him at risk for reintubation -pulmonary hygiene -wean oxygen as able  Acute metabolic encephalopathy due to renal failure, uremia, ICU delirium -CRRT per Nephro; will con't to reassess if high BUN is associated with ongoing encephalopathy or not -avoid sedating meds -too encephalopathic to participate much with PT, OT, SLP still -d/c PRN muscle relaxers -avoiding renally cleared pain meds  Acute kidney injury Acute metabolic acidosis due to renal failure hyperphosphatemia -strict I/O -renally dose meds, avoid nephrotoxic meds -monitor UOP from urostomy -renvela held while on CRRT  Hypertension; antihypertensives -stop amlodipine and metoprolol  since it has been held  History of diabetes; uncontrolled overnight while on TF -SSI PRN -TF coverage insulin  - noveolog increased to 6 units q4h while TF running -con't glargine 25 units> moving to night time since he is getting nocturnal feeds -goal BG 140-180  Anemia Right flank hematoma History of PE, was on  Eliquis at home -con't heparin  -no plans to restart eliquis until we are less likely to need ICU procedures or PEG  History of HFpEF -TED hose -volume management with CRRT  Moderate protein calorie malnutrition -con't TF; trying to only run it part oft he day to allow eating, but his mental status has been the major limitation for him eating -appreciate SLP  Deconditioning Concern for dysphagia -PT, OT SLP  Wife not at bedside this morning.    Best Practice (right click and Reselect all SmartList Selections daily)   Diet/type: tubefeeds  DVT prophylaxis systemic heparin   Pressure ulcer(s): per nursing assessment GI prophylaxis: N/A Lines: Central line, PICC art line  Foley:  N/A urostomy  Code Status:  full code Last date of multidisciplinary goals of care discussion [spouse updated daily]   This patient is critically ill with multiple organ system failure which requires frequent high complexity decision making, assessment, support, evaluation, and titration of therapies. This was completed through the application of advanced monitoring technologies and extensive interpretation of multiple databases. During this encounter critical care time was devoted to patient care services described in this note  for 35 minutes.  Joesph Mussel, DO 04/29/24 9:00 AM [facility]  For contact information, see Amion. If no response to pager, please call PCCM consult pager. After hours, 7PM- 7AM, please call Elink.

## 2024-04-29 NOTE — Procedures (Signed)
 I saw and evaluated the patient on CRRT.  I reviewed the last 24 hours events.  Adjustments to CRRT prescription are made as needed.  Because of high metabolic demands increase DFR to 2000  Filed Weights   04/27/24 1350 04/28/24 0500 04/29/24 0411  Weight: 110 kg 107.9 kg 108.3 kg    Recent Labs  Lab 04/29/24 0356  NA 139  K 4.3  CL 104  CO2 23  GLUCOSE 164*  BUN 88*  CREATININE 2.51*  CALCIUM  8.0*  PHOS 3.7    Recent Labs  Lab 04/27/24 0453 04/28/24 0441 04/29/24 0355  WBC 20.7* 17.9* 12.9*  HGB 8.8* 8.7* 8.4*  HCT 27.7* 27.2* 26.5*  MCV 89.4 89.5 89.2  PLT 373 329 305    Scheduled Meds:  Chlorhexidine  Gluconate Cloth  6 each Topical Q2200   Chlorhexidine  Gluconate Cloth  6 each Topical Q0600   feeding supplement (PROSource TF20)  60 mL Per Tube BID   [START ON 04/30/2024] feeding supplement (VITAL 1.5 CAL)  1,440 mL Per Tube Q24H   insulin  aspart  0-20 Units Subcutaneous Q4H   insulin  aspart  6 Units Subcutaneous 5 times per day   insulin  glargine-yfgn  25 Units Subcutaneous QHS   loperamide HCl  2 mg Per Tube QHS   nutrition supplement (JUVEN)  1 packet Per Tube BID BM   mouth rinse  15 mL Mouth Rinse 4 times per day   polycarbophil  625 mg Per Tube BID   Continuous Infusions:  sodium chloride  10 mL/hr at 04/29/24 0800   albumin  human     heparin  1,900 Units/hr (04/29/24 0800)   micafungin  (MYCAMINE ) 150 mg in sodium chloride  0.9 % 100 mL IVPB     piperacillin -tazobactam (ZOSYN )  IV 12.5 mL/hr at 04/29/24 0800   prismasol  BGK 4/2.5 400 mL/hr at 04/29/24 0334   prismasol  BGK 4/2.5 400 mL/hr at 04/29/24 0336   prismasol  BGK 4/2.5 2,000 mL/hr at 04/29/24 0832   PRN Meds:.sodium chloride , acetaminophen , acetaminophen , albumin  human, artificial tears, fentaNYL  (SUBLIMAZE ) injection, heparin , hydrALAZINE , magic mouthwash, menthol -cetylpyridinium, naphazoline-glycerin , mouth rinse, oxyCODONE , phenol, sodium chloride , sodium chloride , sodium chloride  flush    Rowan Cooter,  MD 04/29/2024, 9:28 AM

## 2024-04-29 NOTE — TOC Progression Note (Signed)
 Transition of Care [facility]) - Progression Note    Patient Details  Name: Kristopher Thompson MRN: [id number] Date of Birth: Oct 23, 1951  Transition of Care [facility]) CM/SW Contact  Juliane Och, LCSW Phone Number: 04/29/2024, 3:03 PM  Clinical Narrative:     3:03 PM CSW introduced self and role to patient's spouse, Kristopher Thompson (patient is disoriented x2). CSW informed Kristopher Thompson of physical therapy recommendation of patient discharging to SNF. Debbie consented CSW to send referrals to SNFs within [place], [place] area. Debbie expressed preference in [facility].   Expected Discharge Plan: Skilled Nursing Facility Barriers to Discharge: Continued Medical Work up, SNF Pending bed offer, English as a second language teacher  Expected Discharge Plan and Services In-house Referral: Clinical Social Work Discharge Planning Services: CM Consult Post Acute Care Choice: Skilled Nursing Facility Living arrangements for the past 2 months: Single Family Home                                       Social Determinants of Health (SDOH) Interventions SDOH Screenings   Food Insecurity: No Food Insecurity (04/12/2024)  Housing: Low Risk  (04/12/2024)  Transportation Needs: No Transportation Needs (04/12/2024)  Utilities: Not At Risk (04/12/2024)  Social Connections: Socially Integrated (04/10/2024)  Tobacco Use: Medium Risk (04/10/2024)    Readmission Risk Interventions    04/12/2024    4:58 PM  Readmission Risk Prevention Plan  Transportation Screening Complete  PCP or Specialist Appt within 3-5 Days Complete  HRI or Home Care Consult Complete  Social Work Consult for Recovery Care Planning/Counseling Complete  Palliative Care Screening Not Applicable  Medication Review Oceanographer) Complete

## 2024-04-29 NOTE — Evaluation (Signed)
 Occupational Therapy Evaluation Patient Details Name: Kristopher Thompson MRN: [id number] DOB: 08-Oct-1951 Today's Date: 04/29/2024   History of Present Illness   Pt is a 73 y/o M s/p hernia repair and urostomy revision on 5/30. Returned to OR 6/9 for perforated bowel, post op shock, s/p exploratory lapartomy with drainage of R abdominal wall abscess on 6/9. CRRT 6/11-6/14, restarted 6/17. Extubated 6/14, Transferred to [facility] 6/15 for intermittent HD. PMH includes CHF, colostomy, DM, GERD, abdominal surgery.     Clinical Impressions Per chart review, pt ind prior to hospitalization, was living with spouse. Pt needing max -total A for ADLs at this time, tolerates bed chair position during session. Noted BUE/BLE weakness, and edema in BUEs, pt with R cervical rotation but able to bring head to midline with physical assist. Pt presentingw ith impairments listed below, will follow acutely. Patient will benefit from continued inpatient follow up therapy, <3 hours/day to maximize safety/ind with ADL/funcitonal mobility.      If plan is discharge home, recommend the following:   Two people to help with walking and/or transfers;Two people to help with bathing/dressing/bathroom;Assistance with cooking/housework;Assistance with feeding;Direct supervision/assist for medications management;Direct supervision/assist for financial management;Assist for transportation;Help with stairs or ramp for entrance     Functional Status Assessment   Patient has had a recent decline in their functional status and demonstrates the ability to make significant improvements in function in a reasonable and predictable amount of time.     Equipment Recommendations   Other (comment) (defer)     Recommendations for Other Services   PT consult     Precautions/Restrictions   Precautions Precaution/Restrictions Comments: bil JP drains, urostomy, NGT, CRRT Restrictions Weight Bearing Restrictions Per Provider  Order: No     Mobility Bed Mobility               General bed mobility comments: tolerates bed mobility, deferred EOB/OOB at this time due to impaired command follow    Transfers                   General transfer comment: deferred      Balance                                           ADL either performed or assessed with clinical judgement   ADL Overall ADL's : Needs assistance/impaired Eating/Feeding: NPO   Grooming: Maximal assistance;Oral care;Bed level   Upper Body Bathing: Maximal assistance;Bed level   Lower Body Bathing: Bed level;Total assistance   Upper Body Dressing : Maximal assistance;Bed level   Lower Body Dressing: Bed level;Total assistance   Toilet Transfer: Maximal assistance;+2 for physical assistance   Toileting- Clothing Manipulation and Hygiene: Total assistance       Functional mobility during ADLs: Maximal assistance       Vision   Vision Assessment?: No apparent visual deficits     Perception Perception: Not tested       Praxis Praxis: Not tested       Pertinent Vitals/Pain Pain Assessment Pain Assessment: No/denies pain     Extremity/Trunk Assessment Upper Extremity Assessment Upper Extremity Assessment: Generalized weakness (PROM WFL, lifts BUE ~6 above bed, 1/5 grasp, edematous)   Lower Extremity Assessment Lower Extremity Assessment: Defer to PT evaluation   Cervical / Trunk Assessment Cervical / Trunk Assessment: Other exceptions Cervical / Trunk Exceptions: cervical rotation to R  Communication Communication Communication: Impaired Factors Affecting Communication: Reduced clarity of speech;Difficulty expressing self   Cognition Arousal: Lethargic Behavior During Therapy: Flat affect Cognition: Difficult to assess Difficult to assess due to: Level of arousal           OT - Cognition Comments: states name and DOB, more alert once in chair position, follows <50% of  commands                 Following commands: Intact       Cueing  General Comments   Cueing Techniques: Verbal cues  VSS   Exercises     Shoulder Instructions      Home Living Family/patient expects to be discharged to:: Private residence Living Arrangements: Spouse/significant other Available Help at Discharge: Family;Available 24 hours/day Type of Home: House Home Access: Stairs to enter [employer] of Steps: 1   Home Layout: One level     Bathroom Shower/Tub: Chief Strategy Officer: Standard     Home Equipment: Rollator (4 wheels)   Additional Comments: taken per chart review      Prior Functioning/Environment Prior Level of Function : Independent/Modified Independent                    OT Problem List: Decreased strength;Decreased range of motion;Decreased activity tolerance;Impaired balance (sitting and/or standing);Decreased safety awareness   OT Treatment/Interventions: Self-care/ADL training;Therapeutic exercise;Energy conservation;DME and/or AE instruction;Therapeutic activities;Patient/family education;Balance training      OT Goals(Current goals can be found in the care plan section)   Acute Rehab OT Goals Patient Stated Goal: none stated OT Goal Formulation: With patient Time For Goal Achievement: 05/13/24 Potential to Achieve Goals: Fair ADL Goals Pt Will Perform Grooming: with min assist;bed level;sitting Pt/caregiver will Perform Home Exercise Program: Both right and left upper extremity;Increased strength;Increased ROM;With Supervision Additional ADL Goal #1: pt will perform bed mobility mod A in prep for seated ADLs   OT Frequency:  Min 2X/week    Co-evaluation PT/OT/SLP Co-Evaluation/Treatment: Yes Reason for Co-Treatment: Complexity of the patient's impairments (multi-system involvement);Necessary to address cognition/behavior during functional activity;To address functional/ADL transfers   OT  goals addressed during session: ADL's and self-care;Strengthening/ROM      AM-PAC OT 6 Clicks Daily Activity     Outcome Measure Help from another person eating meals?: A Lot Help from another person taking care of personal grooming?: A Lot Help from another person toileting, which includes using toliet, bedpan, or urinal?: Total Help from another person bathing (including washing, rinsing, drying)?: A Lot Help from another person to put on and taking off regular upper body clothing?: A Lot Help from another person to put on and taking off regular lower body clothing?: Total 6 Click Score: 10   End of Session Nurse Communication: Mobility status  Activity Tolerance: Patient limited by lethargy;Patient limited by fatigue Patient left: in bed;with call bell/phone within reach;with bed alarm set  OT Visit Diagnosis: Unsteadiness on feet (R26.81);Other abnormalities of gait and mobility (R26.89);Muscle weakness (generalized) (M62.81)                Time: 1610-9604 OT Time Calculation (min): 25 min Charges:  OT General Charges $OT Visit: 1 Visit OT Evaluation $OT Eval Moderate Complexity: 1 Mod  Bauer Ausborn K, OTD, OTR/L SecureChat Preferred Acute Rehab [phone]   Benedict Brain Koonce 04/29/2024, 11:52 AM

## 2024-04-29 NOTE — Progress Notes (Incomplete)
 PHARMACY CONSULT NOTE FOR:  OUTPATIENT  PARENTERAL ANTIBIOTIC THERAPY (OPAT)  Indication:  Regimen:  End date: 05/18/24 (4 weeks from 04/21/24)  IV antibiotic discharge orders are pended. To discharging provider:  please sign these orders via discharge navigator,  Select New Orders & click on the button choice - Manage This Unsigned Work.     Thank you for allowing pharmacy to be a part of this patient's care.  Vangie Genet 04/29/2024, 8:16 AM

## 2024-04-29 NOTE — Consult Note (Addendum)
 I have seen and examined the patient. I have personally reviewed the clinical findings, laboratory findings, microbiological data and imaging studies. The assessment and treatment plan was discussed with the Nurse Practitioner. I agree with her/his recommendations except following additions/corrections.  # Delayed Bowel perforation with intraabdominal infection in the setting of robotic hernia repair with mesh, urostomy ileal conduit revision on 5/30 s/p ex lap with explantation of abdominal mesh, small bowel resection allowed by male wall debridement, ileal resection, and ileostomy, hernia repair with phasix mesh with fascial closure on 6/10( OR cx with E. Coli, Candida albicans, Enterococcus faecalis and Bacteroides spp)   Per Dr [name], he needs Phasix mesh to bridge the recurrent hernias   Exam-ill-appearing man sitting in the bed, not interactive, awake alert but does not follow commands, HEENT WNL, on nasal cannula, S1-S2 RRR, abdomen soft, diverting ileostomy, midline wound VAC, urostomy with clear yellow urine, drains,  drains + no rashes, pedal edema.  RT arm PICC, left IJ HDC and radial art line okay  Continue Zosyn  and micafungin .  Considered switching to fluconazole  but will keep on micafungin  due to elevated QTc as a safer option Monitor CBC and CMP on antibiotics Agree with need for 4 to 6 weeks of antibiotics given phasix mesh (resorbable) placed in a contaminated field. Tentative EOT for 4 weeks 7/7  Unclear disposition at present and would re-engage ID when he is close to DC for arrangement of antibiotics  ID available as needed, recall back with questions concerns or any changes  I spent 63 minutes involved in face-to-face and non-face-to-face activities for this patient on the day of the visit. Professional time spent includes the following activities: Preparing to see the patient (review of tests), Obtaining and reviewing separately obtained history (admission/discharge  record), Performing a medically appropriate examination and evaluation , Ordering medications/tests, Documenting clinical information in the EMR, Independently interpreting results (not separately reported), communicating with other health care providers, Care coordination (not separately reported).       [facility]    Date of Admission:  04/10/2024     Total days of antibiotics 19   Zosyn  6/09 >> c  Micafungin  6/09 >> c Vanc / Cefepime  / Metronidazole  6/02 >> 6/08              Reason for Consult: Acute bowel perf with mesh contamination    Referring Provider: Dr. [name]  Primary Care Provider: Dudley Ghee, MD   Assessment: PRAKASH KIMBERLING is a 73 y.o. male admitted with:    Robotic hernia repair 5/30 -- incisional, parastomal, left inguinal -  C/B Acute Bowel Perforation -- Ex Lap 6/09 & 6/10 -  Multiple surgeries to address this for him. He has wound vac in place now with [facility] following (Dr. [name]). His abdominal mesh was contaminated visually and removed per Dr. Ruddy Corral note 6/9 with what sounds to be new mesh added on 6/10 per Dr. Vann Gent note.  Multiple Species noted from intra-op tissue cultures (Candida Albicans, E coli, Bacteroides and Enterococcus Faecalis)  Returned to OR for ex lap 6/9 and 6/10. Has right upper quadrant end ileostomy and right lower quadrant urostomy ileal conduit with 2 blake drains in place.  Blood cultures were negative from the 6/02 (after cefazolin  intra op abx) and 6/9 sample (after 8d vanc/cefepime  )  Best that the contaminated mesh was removed to treat and cure his infection.  Would require 4-6 weeks to treat this for him. Hopefully enough  of contaminated mesh was removed to achieve a cure.  Timeline of further surgery not defined at present   Septic Shock -  Resolved. Steroids stopped today and remains off pressors in ICU at the time of our evaluation.   Nutrition -  Weaning off TPN and ramping up enteric  feedings.   AKI -  Has required CRRT - still ongoing at present.  No pressors. May convert to iHD soon  - ABX dosed accordingly   Encephalopathy -  Multifactorial. On CRRT to address uremia, sedating meds off.  Following.  - Would not suspect a s/e of piperacillin -tazobactam or micafungin   Diastolic Heart Failure -  Prolonged QTc 501 - 518 ms -  Concerns over cardiac rhythm with fluconazole .  I am not worried the candida albicans would not be sensitive to fluconazole , however given prolonged QTc would prefer to stay off of it.    Plan: Continue zosyn  and micafungin  to cover polymicrobial intraabdominal infection.  Would avoid fluconazole  due to prolonged QTc  ?timeline for further surgery to place more mesh    Principal Problem:   Delayed bowel perforation s/p SBR/end ileostomy Active Problems:   S/P ileal conduit (HCC)   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 08/08/2017   GERD (gastroesophageal reflux disease)   Obesity (BMI 35.0-39.9 without comorbidity)   Prolonged QT interval   Non-recurrent bilateral inguinal hernia without obstruction or gangrene   Incarcerated incisional hernia   Sinus tachycardia   Tachypnea   Acute respiratory insufficiency, postoperative   Sepsis due to undetermined organism (HCC)   Lactic acidosis   Class 2 obesity   Chronic anticoagulation   Hearing loss   History of bladder cancer   Obstructive sleep apnea of adult   Parastomal hernia of ileal conduit   Partial small bowel obstruction (HCC)   Personal history of PE (pulmonary embolism)   Pressure injury of skin    Chlorhexidine  Gluconate Cloth  6 each Topical Q2200   Chlorhexidine  Gluconate Cloth  6 each Topical Q0600   feeding supplement (PROSource TF20)  60 mL Per Tube BID   [START ON 04/30/2024] feeding supplement (VITAL 1.5 CAL)  1,440 mL Per Tube Q24H   insulin  aspart  0-20 Units Subcutaneous Q4H   insulin  aspart  6 Units Subcutaneous 5 times per day   insulin   glargine-yfgn  25 Units Subcutaneous QHS   loperamide HCl  2 mg Per Tube QHS   nutrition supplement (JUVEN)  1 packet Per Tube BID BM   mouth rinse  15 mL Mouth Rinse 4 times per day   polycarbophil  625 mg Per Tube BID    HPI: Ronzell E Wissner is a 73 y.o. male admitted on 5/30 for elective complex hernia repair (incisional/parastomal and ventral hernias, left inguinal incarcerated , abdominal hernias, lysis of adhesions, urostomy revision). Unfortunately this was complicated by perforated bowel and patient returned to OR for ex lap   PMHx bladder cancer surviror s/p cystectomy with ureteral ileal conduit 2018. Prostate cancer, T2DM, RBBB, diastolic HF   Presented to [facility] office to evaluation a very large parastomal hernia around ileal conduit from prior cystectomy and diverting colostomy (since has been taken down). Hernia has been increasing over the 2.5 years and has had to take OTC interventions routinely to improve bowel function.   On exam today he is very sleepy. Opens eyes and makes eye contact but does not participate much beyond that.  History obtained from chart review.   Surgical Timeline:  5/30 Admit;  s/p lysis of ahdesions, repair of incisional, parastomal, left inguinal incarcerated abdominal wall hernias, urostomy ileal conduit revision; CLD. Large mesh placed.   6/9 OR in the early AM for ex-lap, drainage of right abdominal wall abscess, explantation fo abdominal mesh, small bowel resection (left In discontinuity), AbThera wound VAC placement; returned to ICU intubated  6/10 s/p lysis of adhesions, abdominal wall debridement, ileal resection with end ileostomy, fascia closure with wound VAC placement    Review of Systems: ROS   Past Medical History:  Diagnosis Date   At risk for sleep apnea    12-25-2017   STOP-BANG SCORE= 5   --- SENT TO PCP   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   moderate atypia - right low back   Atypical nevus 04/04/2007   moderate to marked - right  upper back (wider shave)   Atypical nevus 04/04/2007   moderate atypia - center chest (wider shave)   Atypical nevus 04/04/2007   slight atypia - right thigh   Atypical nevus 11/29/2011   mild atypia - center upper back   Atypical nevus 11/29/2011   mild atypia - center chest   Bacteremia due to Klebsiella pneumoniae 10/09/2017   Bladder cancer (HCC) dx 07/2017   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  s/p  cystectomy w/ ileal conduit urinary diversion   Candida infection    CHF (congestive heart failure) (HCC)    Colostomy in place [facility])    since 08-08-2017-- per pt 12-25-2017 reddness around stoma   Diabetes mellitus without complication (HCC)    GERD (gastroesophageal reflux disease)    H/O hiatal hernia    History of sepsis 09/2017   dx bacteremia due to klebsiella pneumoniae,  post op intraabdominal abscess   Prostate cancer [facility] urologist-- dr Rozanne Corners   10-02-2012 s/p  prostatectomy-- Stage T1c   RBBB    Renal disorder    pt. denies   Sleep apnea    cpap   Squamous cell carcinoma of skin 05/22/2013   left cheek - CX3 + 5FU   Wears glasses     Social History   Tobacco Use   Smoking status: Former    Current packs/day: 0.00    Types: Cigarettes    Start date: 12/24/1970    Quit date: 12/25/1979    Years since quitting: 44.3   Smokeless tobacco: Never  Vaping Use   Vaping status: Never Used  Substance Use Topics   Alcohol  use: Yes    Comment: rare   Drug use: No    Family History  Problem Relation Age of Onset   Lung cancer Mother    Hypertension Father    Colon cancer Other    CAD Neg Hx    Diabetes Neg Hx    Stroke Neg Hx    Allergies  Allergen Reactions   Demerol  [Meperidine ] Nausea And Vomiting and Other (See Comments)    SEVERE NAUSEA    OBJECTIVE: Blood pressure (!) 117/51, pulse 81, temperature 98.4 F (36.9 C), temperature source Axillary, resp. rate 19, height 5' 11 (1.803 m), weight 108.3 kg, SpO2 100%.  Physical  Exam Constitutional:      Appearance: He is ill-appearing.  HENT:     Nose: Nose normal.     Comments: Enteric tube noted   Cardiovascular:     Rate and Rhythm: Normal rate.     Heart sounds: No murmur heard. Pulmonary:     Effort: Pulmonary effort is normal.     Breath sounds: Normal breath sounds.  Abdominal:     Tenderness: There is abdominal tenderness.     Comments: Wound vac in place, clean and dry.    Skin:    Capillary Refill: Capillary refill takes less than 2 seconds.   Neurological:     Mental Status: He is alert.     Comments: Does not participate much in answering questions to fully gauge    Lab Results Lab Results  Component Value Date   WBC 12.9 (H) 04/29/2024   HGB 8.4 (L) 04/29/2024   HCT 26.5 (L) 04/29/2024   MCV 89.2 04/29/2024   PLT 305 04/29/2024    Lab Results  Component Value Date   CREATININE 2.51 (H) 04/29/2024   BUN 88 (H) 04/29/2024   NA 139 04/29/2024   K 4.3 04/29/2024   CL 104 04/29/2024   CO2 23 04/29/2024    Lab Results  Component Value Date   ALT 29 04/27/2024   AST 20 04/27/2024   ALKPHOS 58 04/27/2024   BILITOT 0.6 04/27/2024     Microbiology: Recent Results (from the past 240 hours)  Culture, blood (Routine X 2) w Reflex to ID Panel     Status: None   Collection Time: 04/20/24  8:46 AM   Specimen: BLOOD RIGHT ARM  Result Value Ref Range Status   Specimen Description   Final    BLOOD RIGHT ARM Performed at [facility], [address] [place], [place] [address]    Special Requests   Final    BOTTLES DRAWN AEROBIC AND ANAEROBIC Blood Culture results may not be optimal due to an inadequate volume of blood received in culture bottles Performed at [facility], [address] [place], [place] [address]    Culture   Final    NO GROWTH 5 DAYS Performed at [facility], [address] [place], [place] [address]    Report Status 04/25/2024 FINAL  Final  Culture, blood (Routine X 2) w  Reflex to ID Panel     Status: None   Collection Time: 04/20/24  8:49 AM   Specimen: BLOOD  Result Value Ref Range Status   Specimen Description   Final    BLOOD Performed at [facility], [address], [place], [place] [address]    Special Requests   Final    BOTTLES DRAWN AEROBIC AND ANAEROBIC Blood Culture results may not be optimal due to an inadequate volume of blood received in culture bottles Performed at [facility], [address], [place], [place] [address]    Culture   Final    NO GROWTH 5 DAYS Performed at [facility], [address], [place], [place] [address]    Report Status 04/25/2024 FINAL  Final  Culture, Respiratory w Gram Stain     Status: None   Collection Time: 04/20/24 12:03 PM   Specimen: Tracheal Aspirate; Respiratory  Result Value Ref Range Status   Specimen Description   Final    TRACHEAL ASPIRATE Performed at [facility], [address], [place], [place] [address]    Special Requests   Final    NONE Performed at [facility], [address] [place], [place] [address]    Gram Stain NO WBC SEEN RARE GRAM POSITIVE COCCI   Final   Culture   Final    RARE Normal respiratory flora-no Staph aureus or Pseudomonas seen Performed at [facility], [address] [place],  [place] [address]    Report Status 04/23/2024 FINAL  Final  Aerobic/Anaerobic Culture w Gram Stain (surgical/deep wound)     Status: None   Collection Time: 04/21/24  2:23 PM   Specimen: Soft Tissue, Other  Result Value Ref Range Status   Specimen Description   Final    TISSUE INTRA ABDOMINAL NECROTIC TISSUE Performed at [facility], [address] [place], [place] [address]    Special Requests   Final    NONE Performed at [facility], [address] [place], [place] [address]    Gram Stain   Final    FEW WBC PRESENT,BOTH PMN AND MONONUCLEAR RARE GRAM POSITIVE  COCCI IN PAIRS AND CHAINS RARE YEAST    Culture   Final    RARE ESCHERICHIA COLI FEW CANDIDA ALBICANS FEW ENTEROCOCCUS FAECALIS FEW BACTEROIDES SPECIES BETA LACTAMASE POSITIVE Performed at [facility], [address], [place], [place] [address]    Report Status 04/25/2024 FINAL  Final   Organism ID, Bacteria ESCHERICHIA COLI  Final   Organism ID, Bacteria ENTEROCOCCUS FAECALIS  Final      Susceptibility   Escherichia coli - MIC*    AMPICILLIN >=32 RESISTANT Resistant     CEFEPIME  <=0.12 SENSITIVE Sensitive     CEFTAZIDIME <=1 SENSITIVE Sensitive     CEFTRIAXONE  1 SENSITIVE Sensitive     CIPROFLOXACIN  <=0.25 SENSITIVE Sensitive     GENTAMICIN  <=1 SENSITIVE Sensitive     IMIPENEM <=0.25 SENSITIVE Sensitive     TRIMETH /SULFA  <=20 SENSITIVE Sensitive     AMPICILLIN/SULBACTAM >=32 RESISTANT Resistant     PIP/TAZO 8 SENSITIVE Sensitive ug/mL    * RARE ESCHERICHIA COLI   Enterococcus faecalis - MIC*    AMPICILLIN <=2 SENSITIVE Sensitive     VANCOMYCIN  1 SENSITIVE Sensitive     GENTAMICIN  SYNERGY SENSITIVE Sensitive     * FEW ENTEROCOCCUS FAECALIS    Gibson Kurtz, MSN, NP-C [facility] Pager: [phone]  04/29/2024 8:57 AM   Total Encounter Time: 18 minutes

## 2024-04-29 NOTE — Plan of Care (Signed)
  Problem: Clinical Measurements: Goal: Respiratory complications will improve Outcome: Progressing   Problem: Activity: Goal: Risk for activity intolerance will decrease Outcome: Progressing   Problem: Nutrition: Goal: Adequate nutrition will be maintained Outcome: Progressing   Problem: Elimination: Goal: Will not experience complications related to bowel motility Outcome: Progressing   Problem: Pain Managment: Goal: General experience of comfort will improve and/or be controlled Outcome: Progressing   Problem: Safety: Goal: Ability to remain free from injury will improve Outcome: Progressing   Problem: Fluid Volume: Goal: Ability to maintain a balanced intake and output will improve Outcome: Progressing   Problem: Nutritional: Goal: Maintenance of adequate nutrition will improve Outcome: Progressing   Problem: Clinical Measurements: Goal: Diagnostic test results will improve Outcome: Progressing

## 2024-04-29 NOTE — Progress Notes (Signed)
 [facility] NEPHROLOGY PROGRESS NOTE  Assessment/ Plan: Pt is a 73 y.o. yo male  with past medical history significant for hypertension, DM, CAD, bladder cancer, CHF, right bundle branch block who had acute bowel perforation and status post OR for bowel repair on 6/8, lysis of adhesion, ileal resection end ileostomy hernia repairs, septic shock seen as a consultation for the evaluation and management of AKI and oliguria.   # Acute kidney injury, oliguric with anasarca likely ischemic ATN due to septic shock and obstructive uropathy given bilateral hydronephrosis.  CRRT 6/11-6/14 now off monitoring for recovery.  UOP great now but clearance lagging.  BUN remains ~100.  He was transferred to [facility] for hemodialysis.  Received this session on 6/16 -Because of the patient's persistent altered mental status and high metabolic state with elevated BUN we putting back on CRRT on 6/17.  Maintain for now. -If mental status fails to improve with driving down BUN it makes uremia less likely the cause of his altered mental status and we would just tolerate a higher BUN and use intermittent hemodialysis to maintain this. - Because of good urine output hopefully he will recover with time  # Bilateral hydronephrosis severe on the left side: S/p revision of ileal conduit 5/30,  urology following.    # Acute bowel perforation is status post bowel repair ileal conduit and hernia repairs urostomy ileal conduit revision.  As per surgical team.    # HTN: Continue current medications and adjust volume status with dialysis as tolerated   # Acute hypoxic respiratory failure/VDRF per pulmonary team. Extubated 6/14, currently on nasal cannula   Will follow.   Subjective: Started on CRRT.  BUN improved somewhat.  Otherwise no complaints  Objective Vital signs in last 24 hours: Vitals:   04/29/24 0630 04/29/24 0700 04/29/24 0746 04/29/24 0800  BP:    (!) 117/51  Pulse: 80 91  81  Resp: 18 (!) 22   19  Temp:   98.4 F (36.9 C)   TempSrc:   Axillary   SpO2: 100% 100%  100%  Weight:      Height:       Weight change: -3.3 kg  Intake/Output Summary (Last 24 hours) at 04/29/2024 0926 Last data filed at 04/29/2024 0801 Gross per 24 hour  Intake 2330.01 ml  Output 3658 ml  Net -1327.99 ml       Labs: RENAL PANEL Recent Labs  Lab 04/26/24 0507 04/26/24 0644 04/26/24 0716 04/27/24 0453 04/28/24 0441 04/28/24 1821 04/29/24 0355 04/29/24 0356  NA 131* 138  --  141  141 138 138  --  139  K 3.7 3.8  --  4.0  4.0 3.9 4.4  --  4.3  CL 101 106  --  108  108 102 103  --  104  CO2 21* 21*  --  18*  17* 23 22  --  23  GLUCOSE 620* 139*  --  183*  183* 154* 207*  --  164*  BUN 100* 99*  --  142*  144* 107* 118*  --  88*  CREATININE 3.19* 3.29*  --  3.99*  3.87* 3.00* 3.30*  --  2.51*  CALCIUM  8.3* 7.9*  --  8.1*  8.4* 8.1* 8.1*  --  8.0*  MG 2.6*  --  2.6* 2.6* 2.2  --  2.3  --   PHOS 5.8*  --  5.9* 7.3*  7.3* 5.1* 6.0*  --  3.7  ALBUMIN  1.9* 2.0*  --  1.8*  1.8* 2.1* 2.1*  --  2.1*    Liver Function Tests: Recent Labs  Lab 04/23/24 0416 04/23/24 1600 04/26/24 0644 04/27/24 0453 04/28/24 0441 04/28/24 1821 04/29/24 0356  AST 21  --  25 20  --   --   --   ALT 13  --  33 29  --   --   --   ALKPHOS 46  --  58 58  --   --   --   BILITOT 0.7  --  0.9 0.6  --   --   --   PROT 4.9*  --  5.6* 5.5*  --   --   --   ALBUMIN  1.8*  1.7*   < > 2.0* 1.8*  1.8* 2.1* 2.1* 2.1*   < > = values in this interval not displayed.   No results for input(s): LIPASE, AMYLASE in the last 168 hours. No results for input(s): AMMONIA in the last 168 hours. CBC: Recent Labs    04/26/24 0507 04/26/24 0644 04/27/24 0453 04/28/24 0441 04/29/24 0355  HGB 8.8* 9.0* 8.8* 8.7* 8.4*  MCV 92.7 91.1 89.4 89.5 89.2    Cardiac Enzymes: No results for input(s): CKTOTAL, CKMB, CKMBINDEX, TROPONINI in the last 168 hours. CBG: Recent Labs  Lab 04/28/24 1530 04/28/24 1957  04/28/24 2334 04/29/24 0344 04/29/24 0749  GLUCAP 118* 205* 182* 156* 170*    Iron Studies: No results for input(s): IRON, TIBC, TRANSFERRIN, FERRITIN in the last 72 hours. Studies/Results: DG Abd Portable 1V Result Date: 04/27/2024 CLINICAL DATA:  Feeding tube placement EXAM: PORTABLE ABDOMEN - 1 VIEW COMPARISON:  04/15/2024 FINDINGS: Frontal view of the lower chest and upper abdomen demonstrates enteric catheter tip projecting over the gastric body. Bowel gas pattern is unremarkable. No acute bony abnormalities. IMPRESSION: 1. Enteric catheter tip projecting over the gastric body. Electronically Signed   By: Bobbye Burrow M.D.   On: 04/27/2024 15:11     Medications: Infusions:  sodium chloride  10 mL/hr at 04/29/24 0800   albumin  human     heparin  1,900 Units/hr (04/29/24 0800)   micafungin  (MYCAMINE ) 150 mg in sodium chloride  0.9 % 100 mL IVPB     piperacillin -tazobactam (ZOSYN )  IV 12.5 mL/hr at 04/29/24 0800   prismasol  BGK 4/2.5 400 mL/hr at 04/29/24 0334   prismasol  BGK 4/2.5 400 mL/hr at 04/29/24 0336   prismasol  BGK 4/2.5 2,000 mL/hr at 04/29/24 5621    Scheduled Medications:  Chlorhexidine  Gluconate Cloth  6 each Topical Q2200   Chlorhexidine  Gluconate Cloth  6 each Topical Q0600   feeding supplement (PROSource TF20)  60 mL Per Tube BID   [START ON 04/30/2024] feeding supplement (VITAL 1.5 CAL)  1,440 mL Per Tube Q24H   insulin  aspart  0-20 Units Subcutaneous Q4H   insulin  aspart  6 Units Subcutaneous 5 times per day   insulin  glargine-yfgn  25 Units Subcutaneous QHS   loperamide HCl  2 mg Per Tube QHS   nutrition supplement (JUVEN)  1 packet Per Tube BID BM   mouth rinse  15 mL Mouth Rinse 4 times per day   polycarbophil  625 mg Per Tube BID    have reviewed scheduled and prn medications.  Physical Exam: General: nad, sleepy but arousable, not answering my questions Heart:normal rate, no rub Lungs: Coarse breath sound bilateral.  Bilateral chest rise, no  iwob Abdomen: Mild distention, no pain with palpation Extremities: 1+ diffuse edema present Dialysis Access: LIJ Temporary HD line c/d/i  Hilario Lover  J Franceen Erisman 04/29/2024,9:26 AM  LOS: 19 days

## 2024-04-30 DIAGNOSIS — J9601 Acute respiratory failure with hypoxia: Secondary | ICD-10-CM | POA: Diagnosis not present

## 2024-04-30 DIAGNOSIS — N179 Acute kidney failure, unspecified: Secondary | ICD-10-CM | POA: Diagnosis not present

## 2024-04-30 DIAGNOSIS — K631 Perforation of intestine (nontraumatic): Secondary | ICD-10-CM | POA: Diagnosis not present

## 2024-04-30 DIAGNOSIS — G9341 Metabolic encephalopathy: Secondary | ICD-10-CM | POA: Diagnosis not present

## 2024-04-30 LAB — GLUCOSE, CAPILLARY
Glucose-Capillary: 132 mg/dL — ABNORMAL HIGH (ref 70–99)
Glucose-Capillary: 137 mg/dL — ABNORMAL HIGH (ref 70–99)
Glucose-Capillary: 156 mg/dL — ABNORMAL HIGH (ref 70–99)
Glucose-Capillary: 173 mg/dL — ABNORMAL HIGH (ref 70–99)
Glucose-Capillary: 179 mg/dL — ABNORMAL HIGH (ref 70–99)
Glucose-Capillary: 89 mg/dL (ref 70–99)

## 2024-04-30 LAB — CBC
HCT: 26.5 % — ABNORMAL LOW (ref 39.0–52.0)
Hemoglobin: 8.4 g/dL — ABNORMAL LOW (ref 13.0–17.0)
MCH: 29 pg (ref 26.0–34.0)
MCHC: 31.7 g/dL (ref 30.0–36.0)
MCV: 91.4 fL (ref 80.0–100.0)
Platelets: 217 10*3/uL (ref 150–400)
RBC: 2.9 MIL/uL — ABNORMAL LOW (ref 4.22–5.81)
RDW: 16.5 % — ABNORMAL HIGH (ref 11.5–15.5)
WBC: 14.5 10*3/uL — ABNORMAL HIGH (ref 4.0–10.5)
nRBC: 0 % (ref 0.0–0.2)

## 2024-04-30 LAB — RENAL FUNCTION PANEL
Albumin: 2 g/dL — ABNORMAL LOW (ref 3.5–5.0)
Albumin: 2.1 g/dL — ABNORMAL LOW (ref 3.5–5.0)
Anion gap: 8 (ref 5–15)
Anion gap: 7 (ref 5–15)
BUN: 50 mg/dL — ABNORMAL HIGH (ref 8–23)
BUN: 49 mg/dL — ABNORMAL HIGH (ref 8–23)
CO2: 23 mmol/L (ref 22–32)
CO2: 23 mmol/L (ref 22–32)
Calcium: 7.8 mg/dL — ABNORMAL LOW (ref 8.9–10.3)
Calcium: 7.9 mg/dL — ABNORMAL LOW (ref 8.9–10.3)
Chloride: 104 mmol/L (ref 98–111)
Chloride: 105 mmol/L (ref 98–111)
Creatinine, Ser: 1.67 mg/dL — ABNORMAL HIGH (ref 0.61–1.24)
Creatinine, Ser: 1.58 mg/dL — ABNORMAL HIGH (ref 0.61–1.24)
GFR, Estimated: 43 mL/min — ABNORMAL LOW (ref >60)
GFR, Estimated: 46 mL/min — ABNORMAL LOW (ref >60)
Glucose, Bld: 171 mg/dL — ABNORMAL HIGH (ref 70–99)
Glucose, Bld: 145 mg/dL — ABNORMAL HIGH (ref 70–99)
Phosphorus: 2.7 mg/dL (ref 2.5–4.6)
Phosphorus: 2.4 mg/dL — ABNORMAL LOW (ref 2.5–4.6)
Potassium: 4.7 mmol/L (ref 3.5–5.1)
Potassium: 4.9 mmol/L (ref 3.5–5.1)
Sodium: 135 mmol/L (ref 135–145)
Sodium: 135 mmol/L (ref 135–145)

## 2024-04-30 LAB — MAGNESIUM: Magnesium: 2.4 mg/dL (ref 1.7–2.4)

## 2024-04-30 LAB — HEPARIN LEVEL (UNFRACTIONATED): Heparin Unfractionated: 0.56 [IU]/mL (ref 0.30–0.70)

## 2024-04-30 MED ORDER — WHITE PETROLATUM EX OINT
TOPICAL_OINTMENT | CUTANEOUS | Status: DC | PRN
  Filled 2024-04-30: qty 28.35

## 2024-04-30 NOTE — TOC Progression Note (Addendum)
 Transition of Care [facility]) - Progression Note    Patient Details  Name: Kristopher Thompson MRN: [id number] Date of Birth: 07-Oct-1951  Transition [facility]) CM/SW Contact  Lauraine FORBES Saa, LCSW Phone Number: 04/30/2024, 10:01 AM  Clinical Narrative:     10:01 AM CSW informed patient's spouse, Marval (patient is not current fully oriented), of SNF options ([facility], [facility], [facility]) and offered to provide Medicare ratings. Debbie declined CSW offer and accepted bed offer at [facility]. CSW informed SNF admissions of acceptance.   Expected Discharge Plan: Skilled Nursing Facility Barriers to Discharge: Continued Medical Work up, SNF Pending bed offer, English as a second language teacher  Expected Discharge Plan and Services In-house Referral: Clinical Social Work Discharge Planning Services: CM Consult Post Acute Care Choice: Skilled Nursing Facility Living arrangements for the past 2 months: Single Family Home                                       Social Determinants of Health (SDOH) Interventions SDOH Screenings   Food Insecurity: No Food Insecurity (04/12/2024)  Housing: Low Risk  (04/12/2024)  Transportation Needs: No Transportation Needs (04/12/2024)  Utilities: Not At Risk (04/12/2024)  Social Connections: Socially Integrated (04/10/2024)  Tobacco Use: Medium Risk (04/10/2024)    Readmission Risk Interventions    04/12/2024    4:58 PM  Readmission Risk Prevention Plan  Transportation Screening Complete  PCP or Specialist Appt within 3-5 Days Complete  HRI or Home Care Consult Complete  Social Work Consult for Recovery Care Planning/Counseling Complete  Palliative Care Screening Not Applicable  Medication Review Oceanographer) Complete

## 2024-04-30 NOTE — Progress Notes (Signed)
 [facility] NEPHROLOGY PROGRESS NOTE  Assessment/ Plan: Pt is a 73 y.o. yo male  with past medical history significant for hypertension, DM, CAD, bladder cancer, CHF, right bundle branch block who had acute bowel perforation and status post OR for bowel repair on 6/8, lysis of adhesion, ileal resection end ileostomy hernia repairs, septic shock seen as a consultation for the evaluation and management of AKI and oliguria.   # Acute kidney injury, oliguric with anasarca likely ischemic ATN due to septic shock and obstructive uropathy given bilateral hydronephrosis.  CRRT 6/11-6/14 now off monitoring for recovery.  UOP great now but clearance lagging.  BUN remains ~100.  He was transferred to [facility] for hemodialysis.  Received this session on 6/16 -Because of the patient's persistent altered mental status and high metabolic state with elevated BUN we putting back on CRRT on 6/17.  Maintain for now but plan for no restart if he has to come off for another reason or stop CRRT tomorrow and then maintain on hemodialysis - Because of fairly decent urine output hopefully he will improve with time  # Bilateral hydronephrosis severe on the left side: S/p revision of ileal conduit 5/30,  urology following.    # Acute bowel perforation is status post bowel repair ileal conduit and hernia repairs urostomy ileal conduit revision.  As per surgical team.    # HTN: Continue current medications and adjust volume status with dialysis as tolerated   # Acute hypoxic respiratory failure/VDRF per pulmonary team. Extubated 6/14, currently on nasal cannula  # Altered mental status: Likely multifactorial.  Uremia may have played some role.  Dialysis as above   Will follow.   Subjective: Patient more awake and alert.  Able to answer some questions.  Denies any complaints  Objective Vital signs in last 24 hours: Vitals:   04/30/24 0700 04/30/24 0729 04/30/24 0800 04/30/24 0900  BP:      Pulse: 83   77 80  Resp: 20  17 17   Temp:  98.1 F (36.7 C)    TempSrc:  Oral    SpO2: 100%  100% 100%  Weight:      Height:       Weight change: -2.5 kg  Intake/Output Summary (Last 24 hours) at 04/30/2024 1007 Last data filed at 04/30/2024 0900 Gross per 24 hour  Intake 2480.25 ml  Output 3599 ml  Net -1118.75 ml       Labs: RENAL PANEL Recent Labs  Lab 04/26/24 0716 04/27/24 0453 04/28/24 0441 04/28/24 1821 04/29/24 0355 04/29/24 0356 04/29/24 1600 04/30/24 0330  NA  --  141  141 138 138  --  139 139 135  K  --  4.0  4.0 3.9 4.4  --  4.3 4.5 4.7  CL  --  108  108 102 103  --  104 104 104  CO2  --  18*  17* 23 22  --  23 25 23   GLUCOSE  --  183*  183* 154* 207*  --  164* 120* 171*  BUN  --  142*  144* 107* 118*  --  88* 66* 50*  CREATININE  --  3.99*  3.87* 3.00* 3.30*  --  2.51* 1.97* 1.67*  CALCIUM   --  8.1*  8.4* 8.1* 8.1*  --  8.0* 8.1* 7.8*  MG 2.6* 2.6* 2.2  --  2.3  --   --  2.4  PHOS 5.9* 7.3*  7.3* 5.1* 6.0*  --  3.7 3.2 2.7  ALBUMIN   --  1.8*  1.8* 2.1* 2.1*  --  2.1* 2.1* 2.0*    Liver Function Tests: Recent Labs  Lab 04/26/24 0644 04/27/24 0453 04/28/24 0441 04/29/24 0356 04/29/24 1600 04/30/24 0330  AST 25 20  --   --   --   --   ALT 33 29  --   --   --   --   ALKPHOS 58 58  --   --   --   --   BILITOT 0.9 0.6  --   --   --   --   PROT 5.6* 5.5*  --   --   --   --   ALBUMIN  2.0* 1.8*  1.8*   < > 2.1* 2.1* 2.0*   < > = values in this interval not displayed.   No results for input(s): LIPASE, AMYLASE in the last 168 hours. No results for input(s): AMMONIA in the last 168 hours. CBC: Recent Labs    04/26/24 0644 04/27/24 0453 04/28/24 0441 04/29/24 0355 04/30/24 0330  HGB 9.0* 8.8* 8.7* 8.4* 8.4*  MCV 91.1 89.4 89.5 89.2 91.4    Cardiac Enzymes: No results for input(s): CKTOTAL, CKMB, CKMBINDEX, TROPONINI in the last 168 hours. CBG: Recent Labs  Lab 04/29/24 1529 04/29/24 1921 04/29/24 2318 04/30/24 0321  04/30/24 0731  GLUCAP 116* 139* 138* 173* 179*    Iron Studies: No results for input(s): IRON, TIBC, TRANSFERRIN, FERRITIN in the last 72 hours. Studies/Results: No results found.    Medications: Infusions:  sodium chloride  10 mL/hr at 04/30/24 0700   albumin  human     heparin  1,900 Units/hr (04/30/24 0856)   micafungin  (MYCAMINE ) 150 mg in sodium chloride  0.9 % 100 mL IVPB Stopped (04/29/24 1142)   piperacillin -tazobactam (ZOSYN )  IV 12.5 mL/hr at 04/30/24 0700   prismasol  BGK 4/2.5 400 mL/hr at 04/30/24 0517   prismasol  BGK 4/2.5 400 mL/hr at 04/30/24 0519   prismasol  BGK 4/2.5 2,000 mL/hr at 04/30/24 2841    Scheduled Medications:  Chlorhexidine  Gluconate Cloth  6 each Topical Q2200   Chlorhexidine  Gluconate Cloth  6 each Topical Q0600   feeding supplement (PROSource TF20)  60 mL Per Tube BID   feeding supplement (VITAL 1.5 CAL)  1,440 mL Per Tube Q24H   insulin  aspart  0-20 Units Subcutaneous Q4H   insulin  aspart  6 Units Subcutaneous 4 times per day   insulin  glargine-yfgn  25 Units Subcutaneous QHS   loperamide HCl  2 mg Per Tube QHS   nutrition supplement (JUVEN)  1 packet Per Tube BID BM   mouth rinse  15 mL Mouth Rinse 4 times per day   polycarbophil  625 mg Per Tube BID    have reviewed scheduled and prn medications.  Physical Exam: General: nad, sleepy but arousable, able to answer some questions Heart:normal rate, no rub Lungs: Coarse breath sound bilateral.  Bilateral chest rise, no iwob Abdomen: Mild distention, no pain with palpation Extremities: 1 trace 1+ pitting edema, warm and well-perfused Dialysis Access: LIJ Temporary HD line c/d/i  Levorn Reason 04/30/2024,10:07 AM  LOS: 20 days

## 2024-04-30 NOTE — Progress Notes (Addendum)
 Nutrition Follow-up  DOCUMENTATION CODES:  Not applicable  INTERVENTION:  Recommend considering nutrasource fiber BID to aid in reducing daily stool volume  Surgery requests cyclic feeds to stimulate appetite. Pt may benefit from continuous feeds and a lower infusion rate until he is alert enough to consistently consume some PO at each meal as he is currently NPO. Await SLP evaluation for ability to advance diet Adjust tube feeding formula to better met pt's estimated nutrition needs.  Vital 1.5 at 100 ml/h x 14 hours (1440 ml per day) Prosource TF20 60 ml BID Provides 2320 kcal, 137 gm protein, 1100 ml free water  daily 1 packet Juven BID, each packet provides 95 calories, 2.5 grams of protein (collagen) + micronutrients to support wound healing   NUTRITION DIAGNOSIS:  Increased nutrient needs related to acute illness as evidenced by estimated needs. - remains applicable  GOAL:  Patient will meet greater than or equal to 90% of their needs - progressing  MONITOR:  PO intake, TF tolerance, Labs, Weight trends, Skin  REASON FOR ASSESSMENT:  Consult Assessment of nutrition requirement/status (CRRT)  ASSESSMENT:  73 y.o male patient with history of Parastomal hernia of ileal conduit. On 5/30 underwent hernia repair with urostomy revision.  5/30 - Admit; s/p lysis of ahdesions, repair of incisional, parastomal, left inguinal incarcerated abdominal wall hernias, urostomy ileal conduit revision; CLD 6/2 - NPO; NGT placed for suction; TPN initiated 6/4 - TPN increased to goal; NGT removed by patient; CLD then back to NPO after emesis; NGT replaced 6/9 - OR in the early AM for ex-lap, drainage of right abdominal wall abscess, explantation fo abdominal mesh, small bowel resection (left In discontinuity), AbThera wound VAC placement; returned to ICU intubated 6/10 - OR for washout and closure 6/11 - Worsening AKI with fluid overload, start CRRT 6/14 - extubated 6/15 - transferred to  [facility] for [facility]  MAP (art line): 64 mmHg  Pt resting in bed at the time of assessment. Wife at bedside. Pt more awake today. Makes eye contact and answers some questions. Does not express nausea or abdominal pain this AM. Continues on CRRT.  Discussed pt with RN, states that SLP will be coming to see pt today to determine if he is awake enough to start taking in some PO. Will monitor for diet advancement and add nutrition supplements to augment intake. Will recommend adjustments of TF rate depending on how much pt is consistently consuming.   Admit weight: 108.9 kg ? Accuracy, copied forward First measured weight: 98.4 kg (6/5) Current weight: 105.8 kg    Intake/Output Summary (Last 24 hours) at 04/30/2024 0849 Last data filed at 04/30/2024 0831 Gross per 24 hour  Intake 2538.04 ml  Output 3577 ml  Net -1038.96 ml  Net IO Since Admission: 4,490.91 mL [04/30/24 0849] UOP: x 24 hours  Drains/Lines: JP Drain, right lateral abdomen, 90 mL x 24 hours JP Drain, left lateral abdomen, 85 mL x 24 hours Ileostomy RUQ, x 24 hours Cortrak PICC, triple lumen Art Line Temporary HD catheter, triple lumen left IJ Wound Vac, upper medial abdomen, 0mL output x 24 hours  Nutritionally Relevant Medications: Scheduled Meds:  PROSource TF20  60 mL Per Tube BID   VITAL 1.5 CAL  1,440 mL Per Tube Q24H   insulin  aspart  0-20 Units Subcutaneous Q4H   insulin  aspart  6 Units Subcutaneous 4 times per day   insulin  glargine-yfgn  25 Units Subcutaneous QHS   loperamide HCl  2 mg Per  Tube QHS   JUVEN  1 packet Per Tube BID BM   polycarbophil  625 mg Per Tube BID   Continuous Infusions:  albumin  human     micafungin  (MYCAMINE ) 150 mg in sodium chloride  0.9 % 100 mL IVPB Stopped (04/29/24 1142)   piperacillin -tazobactam (ZOSYN )  IV 12.5 mL/hr at 04/30/24 0700   Labs Reviewed: BUN 50, creatinine 1.67 CBG ranges from 110-179 mg/dL over the last 24 hours HgbA1c 5.3% (5/21)  NUTRITION -  FOCUSED PHYSICAL EXAM: Flowsheet Row Most Recent Value  Orbital Region Mild depletion  Upper Arm Region No depletion  Thoracic and Lumbar Region No depletion  Buccal Region No depletion  Temple Region No depletion  Clavicle Bone Region No depletion  Clavicle and Acromion Bone Region Mild depletion  Scapular Bone Region Mild depletion  Dorsal Hand No depletion  Patellar Region Severe depletion  Anterior Thigh Region Severe depletion  Posterior Calf Region Severe depletion  Edema (RD Assessment) Moderate  [arms legs]  Hair Reviewed  Eyes Reviewed  Mouth Reviewed  Skin Reviewed  Nails Reviewed    Diet Order:   Diet Order             Diet NPO time specified Except for: Ice Chips, Sips with Meds  Diet effective now                   EDUCATION NEEDS:  Not appropriate for education at this time  Skin: Skin Integrity Issues: Stage 2: - Sacrum (1 x 1 cm) Surgical Incisions: - abdomen, wound vac in place, (21 cm x 4 cm x 4 cm)  Last BM:  x 24 hours via ileostomy  Height:  Ht Readings from Last 1 Encounters:  04/24/24 5' 11 (1.803 m)   Weight:  Wt Readings from Last 1 Encounters:  04/30/24 105.8 kg   Ideal Body Weight:  78.2 kg  BMI:  Body mass index is 32.53 kg/m.  Estimated Nutritional Needs:  Kcal:  2200-2500 kcal/d Protein:  120-140g/d Fluid:  2.2-2.5L/d    Edwena Graham, RD, LDN Registered Dietitian II Please reach out via secure chat

## 2024-04-30 NOTE — Progress Notes (Signed)
 04/30/2024  Kristopher Thompson [id number] [birth year]  CARE TEAM: PCP: Dudley Ghee, MD  Outpatient Care Team: Patient Care Team: Dudley Ghee, MD as PCP - General (Internal Medicine) Devon Fogo, MD (Inactive) as Consulting Physician (Dermatology) Lavina Pou, MD as Referring Physician (Pulmonary Disease) Candyce Champagne, MD as Consulting Physician (General Surgery) Florencio Hunting, MD as Consulting Physician (Urology)  Inpatient Treatment Team: Treatment Team:  Candyce Champagne, MD Etter Hermann., MD Florencio Hunting, MD Pccm, Md, MD Clementine Cutting, MD Catheline Clos, RN Baron Border, MD Terre Ferri, MD Bubba Carbo, NT Synthia Ewing, [facility] Clearnce Curia, RN Davene Ernst, RN Morris, Ewing Holiday, CCC-SLP [place], RN Litzinger, Freddie Jaegers, [place]   Problem List:   Principal Problem:   Delayed bowel perforation s/p SBR/end ileostomy Active Problems:   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 08/08/2017   S/P ileal conduit (HCC)   GERD (gastroesophageal reflux disease)   Obesity (BMI 35.0-39.9 without comorbidity)   Prolonged QT interval   Non-recurrent bilateral inguinal hernia without obstruction or gangrene   Incarcerated incisional hernia   Sinus tachycardia   Tachypnea   Acute respiratory insufficiency, postoperative   Sepsis due to undetermined organism (HCC)   Lactic acidosis   Class 2 obesity   Chronic anticoagulation   Hearing loss   History of bladder cancer   Obstructive sleep apnea of adult   Parastomal hernia of ileal conduit   Partial small bowel obstruction (HCC)   Personal history of PE (pulmonary embolism)   Pressure injury of skin   04/10/2024  POST-OPERATIVE DIAGNOSIS:  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   PROCEDURE:   -ROBOTIC LYSIS OF ADHESIONS X 4 HOURS -COMPONENT SEPARATION - TRANSVERSUS ABDOMINIS REALEASE (TAR) BILATERAL -ROBOTIC REPAIRS OF  INCISIONAL,  PARASTOMAL, LEFT INGUINAL  INCARCERATED ABDOMINAL WALL HERNIAS WITH MESH -UROSTOMY ILEAL CONDUIT REVISION -INTRAOPERATIVE ASSESSMENT OF TISSUE VASCULAR PERFUSION USING ICG (indocyanine green ) -IMMUNOFLUORESCENCE -TRANSVERSUS ABDOMINIS PLANE (TAP) BLOCK - BILATERAL    SURGEON:  Eddye Goodie, MD  OR FINDINGS:  Patient had dense adhesions anterior abdominal wall.  Largest hernia was in the right lower quadrant around the ileal conduit urostomy.  16 x 12 cm region.  Incarcerated with over a foot of small bowel.  Midline next largest 9 x 8 cm incarcerated with numerous loops of small bowel.  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  Another direct space hernia in the left lower quadrant and indirect hernia on the left lower quadrant.  No obvious hernias on the right side.   Component separation's done on both sides to try and get the largest midline and parastomal hernias close down with running serrated try to fix absorbable suture.  Broad underlay repair with 30 x 35 cm mesh transverse  04/20/2024 POST-OPERATIVE DIAGNOSIS:  perforated bowel   PROCEDURE:  Drainage of right abdominal wall abscess as well as intra-abdominal abscess Explantation of abdominal mesh Small bowel resection (in discontinuity) Placement of ABThera wound VAC   SURGEON:  Aldean Hummingbird, MD  ASSISTANTS: Adalberto Acton, MD   04/21/2024  POST-OPERATIVE DIAGNOSIS:  DELAYED BOWEL PERFORATION WITH OPEN ABDOMEN   PROCEDURE:   LYSIS OF ADHESIONS X ABDOMINAL WALL DEBRIDEMENT ILEAL RESECTION END ILEOSTOMY ABDOMINAL WALL PARASTOMAL & INCISIONAL HERNIA REPAIRS WITH PHASIX MESH FASCIA CLOSURE WITH WOUND VAC PLACEMENT IN SQ   SURGEON:  Eddye Goodie, MD   ASSISTANT:  [name], MD   Ostomy:  Right upper quadrant: End ileostomy Right lower quadrant: Urostomy ileal conduit   Drains: 19 Fr Blake drains x2   Right lower quadrant drain rests in the right panniculus abdominal wall  between the Phasix mesh and abdominal wall   Left lower quadrant drain rest in the anterior peritoneum between the mesh and the bowel with the tip resting in the right lower quadrant near the base of the ileal conduit and prior bowel resection  Assessment [facility] Stay = 20 days) 9 Days Post-Op    Stabilizing but still very challenged  Plan:  Shock resolved.  Nutrition: Bowel function returned.  Cycling tube feeds.  Trying 119mL/hour x 14 hours during evening/night.  Fiber to thicken ileostomy effluent -soluble polyfiber/FiberCon  Add loperamide to avoid too much high output ileostomy.  Weaned off TPN 6/16   PO as tolerated.  Patient more alert and talking today.  Will see if speech therapy can retry evaluation and get on a dysphagia 1 diet.  Hold off on G-tube unless poor PO effort >14days  Infection due to delayed bowel perforation and abdominal wall infection/necrosis  Growing multiple organisms including Candida albicans, Enterobacter, and Enterococcus.    IV Zosyn /micafungin .  Anticipate prolonged antibiotics given the abdominal wall infection/necrosis and need for Phasix mesh.    I have asked infectious disease (Dr Lynell Sar)  to help evaluate.  I asked them again to see  Discussed with Dr. Fulton Job with critical care and we both feel micafungin  more appropriate treatment for right now until C albicans sensitivities can be done.  Cardiac/rhythm issues with fluconazole .    Change wound vac q MonWedFri the hopes for the subcutaneous tissues to recover.  Last change 6/18 showing early granulation without necrosis guardedly reassuring.     AKI in the setting of urostomy ileal conduit and shock.    Initially oliguric and now higher volume.    Intraperitoneal surgical Blake fluid creatinine 6/12 same as serum argues against leak.  Dr Rozanne Corners aware  CRRTx vs intermittent HD to help clear uremia/wastes per nephrology.  Most likely will transition to intermittent since  uremia better and mental status better.  Metabolic encephalopathy with all the stresses he has been through, especially Uremia.    CRRTx to help -markedly improved.  Follow closely   CT of chest negative for any obvious pulmonary embolism or pneumonia.  Oxygen needs less.  Echocardiogram shows grade 1 diastolic dysfunction which I believe is his baseline.  Defer to critical care/internal medicine they feel further is needed aside for some monitor diuresis.  ABLA on top of anemia of chronic disease improved with transfusion.  Follow.    -monitor electrolytes & replace as needed.  Keep K>4, Mg>2, Phos>3.    -Diabetes.  Sliding scale insulin .    -VTE prophylaxis-  Full anticoagulation heparin  drip given history of pulmonary embolism,.  Could consider back on DOAC enteral route once off CRRTx  -Mobilize as tolerated.  Agree with critical care about having physical and Occupational Therapy get more involved now that he is more alert.  Hopefully can rebuild strength and minimize further atrophy.  Tentatively saying FL 2 discussion about rehab.  I do not want this patient going to an LTAC.  He is not ready.  I updated the patient's status to the the patient & ICU RN in room.  Recommendations were made.  Questions were answered.  They expressed understanding & appreciation.  -Disposition: He is going to be here a while       I  reviewed nursing notes, last 24 h vitals and pain scores, last 48 h intake and output, last 24 h labs and trends, and last 24 h imaging results.  I have reviewed this patient's available data, including medical history, events of note, test results, etc as part of my evaluation.   A significant portion of that time was spent in counseling. Care during the described time interval was provided by me.  This care required high  level of medical decision making.  04/30/2024    Subjective: (Chief complaint)  On CRRTx More alert & following commands.  Answering  questions Denies pain ICU nurse in room Wife at bedside  Objective:  Vital signs:  Vitals:   04/30/24 0600 04/30/24 0630 04/30/24 0700 04/30/24 0729  BP:      Pulse: 84 87 83   Resp: 17 (!) 23 20   Temp:    98.1 F (36.7 C)  TempSrc:    Oral  SpO2: 100% 100% 100%   Weight:      Height:        Last BM Date : 04/29/24  Intake/Output   Yesterday:  06/18 0701 - 06/19 0700 In: 2509.9 [I.V.:495.3; NG/GT:1710; IV Piggyback:274.5] Out: 1610 [RUEAV:409; Drains:175; Stool:1045] This shift:  Total I/O In: 189.7 [I.V.:18.9; NG/GT:151.7; IV Piggyback:19.1] Out: 253 [Urine:10; Drains:30; Stool:100]  Bowel function:  Flatus: YES  BM:  YES -small  Drains:  RLQ drain (rests between abdominal wall and Phasix mesh): Serous    LLQ drain (runs over bowel with tip down in RLQ pelvis near base of ileal conduit and site of bowel resection):  thinly serosanguinous   Wound vac (in SQ over closed fascia):  serosanguineous   Physical Exam:  General: Awake but not providing much eye contact.  Follows a few commands.  Answering simple questions.  Not agitated Eyes: PERRL, normal EOM.  Sclera clear.  No icterus Neuro: CN II-XII intact w/o focal sensory/motor deficits. Lymph: No head/neck/groin lymphadenopathy Psych:  No delerium/psychosis/paranoia.  No agitation HENT: Normocephalic, Mucus membranes moist.  No thrush.  ETT & NGT in place Neck: Supple, No tracheal deviation.  No obvious thyromegaly Chest: No pain to chest wall compression.  Good respiratory excursion.   CV:  Pulses intact.  Regular rhythm.  1-2+ BUE/BLE edema MS:  No obvious deformity  Abdomen:  Obese but Soft.  Nondistended.  Wound vac in midline.    Flank ecchymosis bilateral posterior resolving.  Abdominal edema mostly resolved   RUQ: (End ileostomy): Tan oatmeal thick effluent in bag RLQ:  (Urostomy ileal conduit): pink with clear light yellow-colored urine  Ext:   No deformity.  Bilateral hand 1+ edema greater  than lower extremity   legs without any edema.  No cyanosis Skin: No petechiae / purpurea.  No major sores.  Warm and dry    Results:   Cultures: Recent Results (from the past 720 hours)  Culture, blood (Routine X 2) w Reflex to ID Panel     Status: None   Collection Time: 04/13/24  7:13 AM   Specimen: BLOOD  Result Value Ref Range Status   Specimen Description   Final    BLOOD BLOOD LEFT ARM AEROBIC BOTTLE ONLY ANAEROBIC BOTTLE ONLY Performed at [facility], [address] [place], [place] [address]    Special Requests   Final    BOTTLES DRAWN AEROBIC AND ANAEROBIC Blood Culture results may not be optimal due to an inadequate volume of blood received in culture bottles Performed at [facility], [address] [place], [place] [address]    Culture   Final    NO GROWTH 5 DAYS Performed at [facility], [address] [place], [place] [address]    Report Status 04/18/2024 FINAL  Final  Culture, blood (Routine X 2) w Reflex to ID Panel     Status: None   Collection Time: 04/13/24  7:20 AM   Specimen: BLOOD  Result Value Ref Range Status   Specimen Description   Final    BLOOD BLOOD RIGHT ARM AEROBIC BOTTLE ONLY ANAEROBIC BOTTLE ONLY Performed at [facility], [address] [place], [place] [address]    Special Requests   Final    BOTTLES DRAWN AEROBIC AND ANAEROBIC Blood Culture results may not be optimal due to an inadequate volume of blood received in culture bottles Performed at [facility], [address], [place], [place] [address]    Culture   Final    NO GROWTH 5 DAYS Performed at [facility], [address] [place], [place] [address]    Report Status 04/18/2024 FINAL  Final  Urine Culture     Status: None   Collection Time: 04/13/24 10:18 AM   Specimen: Urine, Clean Catch  Result Value Ref Range Status   Specimen Description   Final    URINE, CLEAN CATCH Performed at  [facility], [address], [place], [place] [address]    Special Requests   Final    NONE Performed at [facility], [address] [place], [place] [address]    Culture   Final    NO GROWTH Performed at [facility], [address], [place], [place] [address]    Report Status 04/14/2024 FINAL  Final  MRSA Next Gen by PCR, Nasal     Status: None   Collection Time: 04/13/24 11:35 AM   Specimen: Nasal Mucosa; Nasal Swab  Result Value Ref Range Status   MRSA by PCR Next Gen NOT DETECTED NOT DETECTED Final    Comment: (NOTE) The GeneXpert MRSA Assay (FDA approved for NASAL specimens only), is one component of a comprehensive MRSA colonization surveillance program. It is not intended to diagnose MRSA infection nor to guide or monitor treatment for MRSA infections. Test performance is not FDA approved in patients less than 66 years old. Performed at [facility], [address] [place], [place] [address]   Culture, blood (Routine X 2) w Reflex to ID Panel     Status: None   Collection Time: 04/20/24  8:46 AM   Specimen: BLOOD RIGHT ARM  Result Value Ref Range Status   Specimen Description   Final    BLOOD RIGHT ARM Performed at [facility], [address] [place], [place] [address]    Special Requests   Final    BOTTLES DRAWN AEROBIC AND ANAEROBIC Blood Culture results may not be optimal due to an inadequate volume of blood received in culture bottles Performed at [facility], [address] [place], [place] [address]    Culture   Final    NO GROWTH 5 DAYS Performed at [facility], [address], [place], [place] [address]    Report Status 04/25/2024 FINAL  Final  Culture, blood (Routine X 2) w Reflex to ID Panel     Status: None   Collection Time: 04/20/24  8:49 AM   Specimen: BLOOD  Result Value Ref Range Status   Specimen Description   Final    BLOOD Performed at [facility], [address] [place], [place] [address]    Special Requests   Final    BOTTLES DRAWN AEROBIC AND ANAEROBIC Blood Culture results may not be optimal due to an inadequate volume of blood received in culture bottles Performed at [facility], [address] [place], [place] [address]    Culture   Final    NO GROWTH 5 DAYS Performed at [facility], [address], [place], [place] [address]    Report Status 04/25/2024 FINAL  Final  Culture, Respiratory w Gram Stain     Status: None   Collection Time: 04/20/24 12:03 PM   Specimen: Tracheal Aspirate; Respiratory  Result Value Ref Range Status   Specimen Description   Final    TRACHEAL ASPIRATE Performed at [facility], [address] [place], [place] [address]    Special Requests   Final    NONE Performed at [facility], [address] [place], [place] [address]    Gram Stain NO WBC SEEN RARE GRAM POSITIVE COCCI   Final   Culture   Final    RARE Normal respiratory flora-no Staph aureus or Pseudomonas seen Performed at [facility], [address] [place], [place] [address]    Report Status 04/23/2024 FINAL  Final  Aerobic/Anaerobic Culture w Gram Stain (surgical/deep wound)     Status: None   Collection Time: 04/21/24  2:23 PM   Specimen: Soft Tissue, Other  Result Value Ref Range Status   Specimen Description   Final    TISSUE INTRA ABDOMINAL NECROTIC TISSUE Performed at [facility] [address], [place], [place] [address]    Special Requests   Final    NONE Performed at [facility], [address] [place], [place] [address]    Gram Stain   Final    FEW WBC PRESENT,BOTH PMN AND MONONUCLEAR RARE GRAM POSITIVE COCCI IN PAIRS AND CHAINS RARE YEAST    Culture   Final    RARE ESCHERICHIA COLI FEW CANDIDA ALBICANS FEW ENTEROCOCCUS FAECALIS FEW BACTEROIDES SPECIES BETA LACTAMASE  POSITIVE Performed at [facility], [address] [place], [place] [address]    Report Status 04/25/2024 FINAL  Final   Organism ID, Bacteria ESCHERICHIA COLI  Final   Organism ID, Bacteria ENTEROCOCCUS FAECALIS  Final      Susceptibility   Escherichia coli - MIC*    AMPICILLIN >=32 RESISTANT Resistant     CEFEPIME  <=0.12 SENSITIVE Sensitive     CEFTAZIDIME <=1 SENSITIVE Sensitive     CEFTRIAXONE  1 SENSITIVE Sensitive     CIPROFLOXACIN  <=0.25 SENSITIVE Sensitive     GENTAMICIN  <=1 SENSITIVE Sensitive     IMIPENEM <=0.25 SENSITIVE Sensitive     TRIMETH /SULFA  <=20 SENSITIVE Sensitive     AMPICILLIN/SULBACTAM >=32 RESISTANT Resistant     PIP/TAZO 8 SENSITIVE Sensitive ug/mL    * RARE ESCHERICHIA COLI   Enterococcus faecalis - MIC*    AMPICILLIN <=2 SENSITIVE Sensitive     VANCOMYCIN  1 SENSITIVE Sensitive     GENTAMICIN  SYNERGY SENSITIVE Sensitive     * FEW ENTEROCOCCUS FAECALIS    Labs: Results for orders placed or performed during the hospital encounter of 04/10/24 (from the past 48 hours)  Glucose, capillary     Status: None   Collection Time: 04/28/24  10:26 AM  Result Value Ref Range   Glucose-Capillary 99 70 - 99 mg/dL    Comment: Glucose reference range applies only to samples taken after fasting for at least 8 hours.  Glucose, capillary     Status: Abnormal   Collection Time: 04/28/24 11:23 AM  Result Value Ref Range   Glucose-Capillary 122 (H) 70 - 99 mg/dL    Comment: Glucose reference range applies only to samples taken after fasting for at least 8 hours.  Glucose, capillary     Status: Abnormal   Collection Time: 04/28/24  3:30 PM  Result Value Ref Range   Glucose-Capillary 118 (H) 70 - 99 mg/dL    Comment: Glucose reference range applies only to samples taken after fasting for at least 8 hours.  Renal function panel (daily at 1600)     Status: Abnormal   Collection Time: 04/28/24  6:21 PM  Result Value Ref Range   Sodium 138 135 - 145 mmol/L   Potassium  4.4 3.5 - 5.1 mmol/L   Chloride 103 98 - 111 mmol/L   CO2 22 22 - 32 mmol/L   Glucose, Bld 207 (H) 70 - 99 mg/dL    Comment: Glucose reference range applies only to samples taken after fasting for at least 8 hours.   BUN 118 (H) 8 - 23 mg/dL   Creatinine, Ser 1.61 (H) 0.61 - 1.24 mg/dL   Calcium  8.1 (L) 8.9 - 10.3 mg/dL   Phosphorus 6.0 (H) 2.5 - 4.6 mg/dL   Albumin  2.1 (L) 3.5 - 5.0 g/dL   GFR, Estimated 19 (L) >60 mL/min    Comment: (NOTE) Calculated using the CKD-EPI Creatinine Equation (2021)    Anion gap 13 5 - 15    Comment: Performed at [facility], [address], [place], [place] [address]  Glucose, capillary     Status: Abnormal   Collection Time: 04/28/24  7:57 PM  Result Value Ref Range   Glucose-Capillary 205 (H) 70 - 99 mg/dL    Comment: Glucose reference range applies only to samples taken after fasting for at least 8 hours.  Glucose, capillary     Status: Abnormal   Collection Time: 04/28/24 11:34 PM  Result Value Ref Range   Glucose-Capillary 182 (H) 70 - 99 mg/dL    Comment: Glucose reference range applies only to samples taken after fasting for at least 8 hours.  Glucose, capillary     Status: Abnormal   Collection Time: 04/29/24  3:44 AM  Result Value Ref Range   Glucose-Capillary 156 (H) 70 - 99 mg/dL    Comment: Glucose reference range applies only to samples taken after fasting for at least 8 hours.  CBC     Status: Abnormal   Collection Time: 04/29/24  3:55 AM  Result Value Ref Range   WBC 12.9 (H) 4.0 - 10.5 K/uL   RBC 2.97 (L) 4.22 - 5.81 MIL/uL   Hemoglobin 8.4 (L) 13.0 - 17.0 g/dL   HCT 54.0 (L) 98.1 - 19.1 %   MCV 89.2 80.0 - 100.0 fL   MCH 28.3 26.0 - 34.0 pg   MCHC 31.7 30.0 - 36.0 g/dL   RDW 47.8 (H) 29.5 - 62.1 %   Platelets 305 150 - 400 K/uL   nRBC 0.0 0.0 - 0.2 %    Comment: Performed at [facility], [address] [place], [place] [address]  Magnesium      Status: None   Collection Time: 04/29/24  3:55  AM  Result Value  Ref Range   Magnesium  2.3 1.7 - 2.4 mg/dL    Comment: Performed at [facility], [address] [place], [place] [address]  Heparin  level (unfractionated)     Status: None   Collection Time: 04/29/24  3:56 AM  Result Value Ref Range   Heparin  Unfractionated 0.48 0.30 - 0.70 IU/mL    Comment: (NOTE) The clinical reportable range upper limit is being lowered to >1.10 to align with the FDA approved guidance for the current laboratory assay.  If heparin  results are below expected values, and patient dosage has  been confirmed, suggest follow up testing of antithrombin III levels. Performed at [facility], [address], [place], [place] [address]   Renal function panel (daily at 0500)     Status: Abnormal   Collection Time: 04/29/24  3:56 AM  Result Value Ref Range   Sodium 139 135 - 145 mmol/L   Potassium 4.3 3.5 - 5.1 mmol/L   Chloride 104 98 - 111 mmol/L   CO2 23 22 - 32 mmol/L   Glucose, Bld 164 (H) 70 - 99 mg/dL    Comment: Glucose reference range applies only to samples taken after fasting for at least 8 hours.   BUN 88 (H) 8 - 23 mg/dL   Creatinine, Ser 8.65 (H) 0.61 - 1.24 mg/dL   Calcium  8.0 (L) 8.9 - 10.3 mg/dL   Phosphorus 3.7 2.5 - 4.6 mg/dL   Albumin  2.1 (L) 3.5 - 5.0 g/dL   GFR, Estimated 27 (L) >60 mL/min    Comment: (NOTE) Calculated using the CKD-EPI Creatinine Equation (2021)    Anion gap 12 5 - 15    Comment: Performed at [facility], [address], [place], [place] [address]  Glucose, capillary     Status: Abnormal   Collection Time: 04/29/24  7:49 AM  Result Value Ref Range   Glucose-Capillary 170 (H) 70 - 99 mg/dL    Comment: Glucose reference range applies only to samples taken after fasting for at least 8 hours.  Glucose, capillary     Status: Abnormal   Collection Time: 04/29/24 11:09 AM  Result Value Ref Range   Glucose-Capillary 110 (H) 70 - 99 mg/dL    Comment: Glucose reference range applies only to samples taken after  fasting for at least 8 hours.  Glucose, capillary     Status: Abnormal   Collection Time: 04/29/24  3:29 PM  Result Value Ref Range   Glucose-Capillary 116 (H) 70 - 99 mg/dL    Comment: Glucose reference range applies only to samples taken after fasting for at least 8 hours.  Renal function panel (daily at 1600)     Status: Abnormal   Collection Time: 04/29/24  4:00 PM  Result Value Ref Range   Sodium 139 135 - 145 mmol/L   Potassium 4.5 3.5 - 5.1 mmol/L   Chloride 104 98 - 111 mmol/L   CO2 25 22 - 32 mmol/L   Glucose, Bld 120 (H) 70 - 99 mg/dL    Comment: Glucose reference range applies only to samples taken after fasting for at least 8 hours.   BUN 66 (H) 8 - 23 mg/dL   Creatinine, Ser 6.29 (H) 0.61 - 1.24 mg/dL   Calcium  8.1 (L) 8.9 - 10.3 mg/dL   Phosphorus 3.2 2.5 - 4.6 mg/dL   Albumin  2.1 (L) 3.5 - 5.0 g/dL   GFR, Estimated 36 (L) >60 mL/min  Comment: (NOTE) Calculated using the CKD-EPI Creatinine Equation (2021)    Anion gap 10 5 - 15    Comment: Performed at [facility], [address] [place], [place] [address]  Glucose, capillary     Status: Abnormal   Collection Time: 04/29/24  7:21 PM  Result Value Ref Range   Glucose-Capillary 139 (H) 70 - 99 mg/dL    Comment: Glucose reference range applies only to samples taken after fasting for at least 8 hours.  Glucose, capillary     Status: Abnormal   Collection Time: 04/29/24 11:18 PM  Result Value Ref Range   Glucose-Capillary 138 (H) 70 - 99 mg/dL    Comment: Glucose reference range applies only to samples taken after fasting for at least 8 hours.  Glucose, capillary     Status: Abnormal   Collection Time: 04/30/24  3:21 AM  Result Value Ref Range   Glucose-Capillary 173 (H) 70 - 99 mg/dL    Comment: Glucose reference range applies only to samples taken after fasting for at least 8 hours.  Heparin  level (unfractionated)     Status: None   Collection Time: 04/30/24  3:30 AM  Result Value Ref Range   Heparin   Unfractionated 0.56 0.30 - 0.70 IU/mL    Comment: (NOTE) The clinical reportable range upper limit is being lowered to >1.10 to align with the FDA approved guidance for the current laboratory assay.  If heparin  results are below expected values, and patient dosage has  been confirmed, suggest follow up testing of antithrombin III levels. Performed at [facility], [address], [place], [place] [address]   Renal function panel (daily at 0500)     Status: Abnormal   Collection Time: 04/30/24  3:30 AM  Result Value Ref Range   Sodium 135 135 - 145 mmol/L   Potassium 4.7 3.5 - 5.1 mmol/L   Chloride 104 98 - 111 mmol/L   CO2 23 22 - 32 mmol/L   Glucose, Bld 171 (H) 70 - 99 mg/dL    Comment: Glucose reference range applies only to samples taken after fasting for at least 8 hours.   BUN 50 (H) 8 - 23 mg/dL   Creatinine, Ser 0.98 (H) 0.61 - 1.24 mg/dL   Calcium  7.8 (L) 8.9 - 10.3 mg/dL   Phosphorus 2.7 2.5 - 4.6 mg/dL   Albumin  2.0 (L) 3.5 - 5.0 g/dL   GFR, Estimated 43 (L) >60 mL/min    Comment: (NOTE) Calculated using the CKD-EPI Creatinine Equation (2021)    Anion gap 8 5 - 15    Comment: Performed at [facility], [address] [place], [place] [address]  Magnesium      Status: None   Collection Time: 04/30/24  3:30 AM  Result Value Ref Range   Magnesium  2.4 1.7 - 2.4 mg/dL    Comment: Performed at [facility], [address] [place], [place] [address]  CBC     Status: Abnormal   Collection Time: 04/30/24  3:30 AM  Result Value Ref Range   WBC 14.5 (H) 4.0 - 10.5 K/uL   RBC 2.90 (L) 4.22 - 5.81 MIL/uL   Hemoglobin 8.4 (L) 13.0 - 17.0 g/dL   HCT 62.1 (L) 30.8 - 65.7 %   MCV 91.4 80.0 - 100.0 fL   MCH 29.0 26.0 - 34.0 pg   MCHC 31.7 30.0 - 36.0 g/dL   RDW 84.6 (H) 96.2 - 95.2 %   Platelets 217  150 - 400 K/uL   nRBC 0.0 0.0 - 0.2 %    Comment: Performed at [facility], [address], [place], [place] [address]  Glucose, capillary     Status:  Abnormal   Collection Time: 04/30/24  7:31 AM  Result Value Ref Range   Glucose-Capillary 179 (H) 70 - 99 mg/dL    Comment: Glucose reference range applies only to samples taken after fasting for at least 8 hours.    Imaging / Studies: No results found.     Medications / Allergies: per chart  Antibiotics: Anti-infectives (From admission, onward)    Start     Dose/Rate Route Frequency Ordered Stop   04/29/24 1000  micafungin  (MYCAMINE ) 150 mg in sodium chloride  0.9 % 100 mL IVPB        150 mg 107.5 mL/hr over 1 Hours Intravenous Every 24 hours 04/28/24 1036     04/28/24 1345  micafungin  (MYCAMINE ) 100 mg in sodium chloride  0.9 % 100 mL IVPB        100 mg 105 mL/hr over 1 Hours Intravenous  Once 04/28/24 1245 04/28/24 1354   04/28/24 1000  micafungin  (MYCAMINE ) 100 mg in sodium chloride  0.9 % 100 mL IVPB  Status:  Discontinued        100 mg 105 mL/hr over 1 Hours Intravenous Every 24 hours 04/27/24 1219 04/28/24 1036   04/21/24 1400  clindamycin  (CLEOCIN ) 900 mg, gentamicin  (GARAMYCIN ) 240 mg in sodium chloride  0.9 % 1,000 mL for intraperitoneal lavage  Status:  Discontinued         Irrigation To Surgery 04/21/24 1346 04/21/24 1618   04/20/24 1400  piperacillin -tazobactam (ZOSYN ) IVPB 3.375 g        3.375 g 12.5 mL/hr over 240 Minutes Intravenous Every 8 hours 04/20/24 0755     04/20/24 1000  metroNIDAZOLE  (FLAGYL ) IVPB 500 mg  Status:  Discontinued        500 mg 100 mL/hr over 60 Minutes Intravenous Every 12 hours 04/20/24 0320 04/20/24 0752   04/20/24 1000  micafungin  (MYCAMINE ) 150 mg in sodium chloride  0.9 % 100 mL IVPB  Status:  Discontinued        150 mg 107.5 mL/hr over 1 Hours Intravenous Every 24 hours 04/20/24 0752 04/27/24 1219   04/20/24 0245  ceFEPIme  (MAXIPIME ) 2 g in sodium chloride  0.9 % 100 mL IVPB  Status:  Discontinued        2 g 200 mL/hr over 30 Minutes Intravenous Every 8 hours 04/20/24 0232 04/20/24 0752   04/20/24 0100  metroNIDAZOLE  (FLAGYL ) IVPB 500  mg        500 mg 100 mL/hr over 60 Minutes Intravenous On call to O.R. 04/20/24 0004 04/20/24 0100   04/14/24 1800  ceFEPIme  (MAXIPIME ) 2 g in sodium chloride  0.9 % 100 mL IVPB        2 g 200 mL/hr over 30 Minutes Intravenous Every 8 hours 04/14/24 1003 04/19/24 0957   04/14/24 1600  vancomycin  (VANCOCIN ) IVPB 1000 mg/200 mL premix  Status:  Discontinued        1,000 mg 200 mL/hr over 60 Minutes Intravenous Every 24 hours 04/13/24 1420 04/14/24 1000   04/14/24 1600  vancomycin  (VANCOREADY) IVPB 1500 mg/300 mL  Status:  Discontinued        1,500 mg 150 mL/hr over 120 Minutes Intravenous Every 24 hours 04/14/24 1002 04/16/24 0744   04/14/24 1200  vancomycin  (VANCOCIN ) IVPB 1000 mg/200 mL premix  Status:  Discontinued  1,000 mg 200 mL/hr over 60 Minutes Intravenous Every 24 hours 04/13/24 1053 04/13/24 1109   04/13/24 2200  ceFEPIme  (MAXIPIME ) 2 g in sodium chloride  0.9 % 100 mL IVPB  Status:  Discontinued        2 g 200 mL/hr over 30 Minutes Intravenous Every 12 hours 04/13/24 1036 04/13/24 1109   04/13/24 2200  ceFEPIme  (MAXIPIME ) 2 g in sodium chloride  0.9 % 100 mL IVPB  Status:  Discontinued        2 g 200 mL/hr over 30 Minutes Intravenous Every 12 hours 04/13/24 1425 04/14/24 1003   04/13/24 1500  vancomycin  (VANCOCIN ) 2,000 mg in sodium chloride  0.9 % 500 mL IVPB        2,000 mg 260 mL/hr over 120 Minutes Intravenous  Once 04/13/24 1409 04/13/24 1850   04/13/24 1500  ceFEPIme  (MAXIPIME ) 2 g in sodium chloride  0.9 % 100 mL IVPB  Status:  Discontinued        2 g 200 mL/hr over 30 Minutes Intravenous Every 12 hours 04/13/24 1420 04/13/24 1425   04/13/24 1500  metroNIDAZOLE  (FLAGYL ) IVPB 500 mg  Status:  Discontinued        500 mg 100 mL/hr over 60 Minutes Intravenous Every 12 hours 04/13/24 1420 04/17/24 0725   04/13/24 1200  vancomycin  (VANCOCIN ) 2,000 mg in sodium chloride  0.9 % 500 mL IVPB  Status:  Discontinued        2,000 mg 260 mL/hr over 120 Minutes Intravenous  Once  04/13/24 1052 04/13/24 1121   04/13/24 1100  metroNIDAZOLE  (FLAGYL ) IVPB 500 mg  Status:  Discontinued        500 mg 100 mL/hr over 60 Minutes Intravenous 2 times daily 04/13/24 1007 04/13/24 1109   04/13/24 1100  vancomycin  (VANCOCIN ) IVPB 1000 mg/200 mL premix  Status:  Discontinued        1,000 mg 200 mL/hr over 60 Minutes Intravenous  Once 04/13/24 1007 04/13/24 1020   04/13/24 1015  ceFEPIme  (MAXIPIME ) 2 g in sodium chloride  0.9 % 100 mL IVPB        2 g 200 mL/hr over 30 Minutes Intravenous STAT 04/13/24 1007 04/14/24 1721   04/10/24 2200  ceFAZolin  (ANCEF ) IVPB 2g/100 mL premix        2 g 200 mL/hr over 30 Minutes Intravenous Every 8 hours 04/10/24 1833 04/11/24 0531   04/10/24 0600  ceFAZolin  (ANCEF ) IVPB 2g/100 mL premix        2 g 200 mL/hr over 30 Minutes Intravenous On call to O.R. 04/10/24 0533 04/10/24 1526         Note: Portions of this report may have been transcribed using voice recognition software. Every effort was made to ensure accuracy; however, inadvertent computerized transcription errors may be present.   Any transcriptional errors that result from this process are unintentional.    Eddye Goodie, MD, FACS, MASCRS Esophageal, Gastrointestinal & Colorectal Surgery Robotic and Minimally Invasive Surgery  [facility] [address], [address] [place], [place] [address] [phone] Fax [phone] Main  CONTACT INFORMATION: Weekday (9AM-5PM): Call [facility] main office at [phone] Weeknight (5PM-9AM) or Weekend/Holiday: Check EPIC Web Links tab & use AMION (password  [email or web address]) for General Surgery [facility] coverage  Please, DO NOT use SecureChat  (it is not reliable communication to reach operating surgeons & will lead to a delay in care).   Epic staff messaging available for outptient concerns needing 1-2 business day response.  04/30/2024  9:17 AM

## 2024-04-30 NOTE — Procedures (Signed)
 I saw and evaluated the patient on CRRT.  I reviewed the last 24 hours events.  Adjustments to CRRT prescription are made as needed.  Decrease UF.  Maintain even  Filed Weights   04/28/24 0500 04/29/24 0411 04/30/24 0439  Weight: 107.9 kg 108.3 kg 105.8 kg    Recent Labs  Lab 04/30/24 0330  NA 135  K 4.7  CL 104  CO2 23  GLUCOSE 171*  BUN 50*  CREATININE 1.67*  CALCIUM  7.8*  PHOS 2.7    Recent Labs  Lab 04/28/24 0441 04/29/24 0355 04/30/24 0330  WBC 17.9* 12.9* 14.5*  HGB 8.7* 8.4* 8.4*  HCT 27.2* 26.5* 26.5*  MCV 89.5 89.2 91.4  PLT 329 305 217    Scheduled Meds:  Chlorhexidine  Gluconate Cloth  6 each Topical Q2200   Chlorhexidine  Gluconate Cloth  6 each Topical Q0600   feeding supplement (PROSource TF20)  60 mL Per Tube BID   feeding supplement (VITAL 1.5 CAL)  1,440 mL Per Tube Q24H   insulin  aspart  0-20 Units Subcutaneous Q4H   insulin  aspart  6 Units Subcutaneous 4 times per day   insulin  glargine-yfgn  25 Units Subcutaneous QHS   loperamide HCl  2 mg Per Tube QHS   nutrition supplement (JUVEN)  1 packet Per Tube BID BM   mouth rinse  15 mL Mouth Rinse 4 times per day   polycarbophil  625 mg Per Tube BID   Continuous Infusions:  sodium chloride  10 mL/hr at 04/30/24 0700   albumin  human     heparin  1,900 Units/hr (04/30/24 0856)   micafungin  (MYCAMINE ) 150 mg in sodium chloride  0.9 % 100 mL IVPB Stopped (04/29/24 1142)   piperacillin -tazobactam (ZOSYN )  IV 12.5 mL/hr at 04/30/24 0700   prismasol  BGK 4/2.5 400 mL/hr at 04/30/24 0517   prismasol  BGK 4/2.5 400 mL/hr at 04/30/24 0519   prismasol  BGK 4/2.5 2,000 mL/hr at 04/30/24 0659   PRN Meds:.sodium chloride , acetaminophen , acetaminophen , albumin  human, artificial tears, fentaNYL  (SUBLIMAZE ) injection, heparin , hydrALAZINE , magic mouthwash, menthol -cetylpyridinium, naphazoline-glycerin , mouth rinse, oxyCODONE , phenol, sodium chloride , sodium chloride , sodium chloride  flush, white petrolatum    Rowan Cooter,  MD 04/30/2024, 10:08 AM

## 2024-04-30 NOTE — Progress Notes (Signed)
 NAME:  Kristopher Thompson, Kristopher Thompson, Kristopher Thompson, LOS: 20 ADMISSION DATE:  04/10/2024, CONSULTATION DATE:  04/13/2024 REFERRING MD:  Dr Bonita Bussing, CHIEF COMPLAINT: Sepsis  History of Present Illness:  S/p repair of incisional/parastomal and ventral hernias, left inguinal incarcerated , abdominal hernias, lysis of adhesions, urostomy revision Day 1 postoperatively  Transferred to the ICU secondary to tachycardia, tachypnea  History of bladder cancer, prostate cancer, diastolic heart failure, diabetes, right bundle branch block, multiple abdominal surgeries  Update 04/20/24: ccm reconsulted post operatively after pt had perforated bowel and returned to OR. Pt is requiring neo and norepi at this time. He remains intubated at this time. Profoundly acidotic with bicarb of 12. Will start sodium bicarb infusion and bolus prior to initiation. Follow labs in am. Updated wife and son at bedside.   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   moderate atypia - right low back   Atypical nevus 04/04/2007   moderate to marked - right upper back (wider shave)   Atypical nevus 04/04/2007   moderate atypia - center chest (wider shave)   Atypical nevus 04/04/2007   slight atypia - right thigh   Atypical nevus 11/29/2011   mild atypia - center upper back   Atypical nevus 11/29/2011   mild atypia - center chest   Bacteremia due to Klebsiella pneumoniae 10/09/2017   Bladder cancer (HCC) dx 07/2017   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 denies   Sleep apnea    cpap   Squamous cell carcinoma of skin 05/22/2013   left cheek - CX3 + 5FU   Wears glasses    Significant Hospital Events: Including procedures, antibiotic start and stop dates in addition to other pertinent events   04/10/2024-laparoscopic surgery 04/13/24 CCM consult. Incr NE. Art line  6/3 weaning NE. Gently diuresed w 3L off  6/4 off NE. Pulled out his NGT refused replacement. After multiple emesis  6/5 cont NGT 6/6 tx to med surg 6/9: taken back to OR for perforated bowel, post-op shock 6/10: Norepinephrine , Phenylephrine , and vasopressin , added steroids, 1u PRBCs, back to OR for washout and closure 6/11: Worsening AKI with fluid overload, start CRRT, decreasing pressor requirements 6/13 tolerating CRRT 6/14: Off CRRT, arousable.  Tolerated weaning for most of the day 6/13 6/14: Extubated, CRRT stopped. 6/15 transferred to [facility] for iHD; montoring for renal recovery 6/16 iHD, severely encephalopathic with high BUN 6/17 restarted CRRT for to ongoing uremia and encephalopathy 6/18 was awake enough to work with PT, OT  Interim History / Subjective:  Remains on CRRT. Today he is answering some questions. Afebrile.  Objective    Blood pressure (!) 117/51, pulse 83, temperature 98.1 F (36.7  C), temperature source Oral, resp. rate 20, height 5' 11 (1.803 m), weight 105.8 kg, SpO2 100%.        Intake/Output Summary (Last 24 hours) at 04/30/2024 0916 Last data filed at 04/30/2024 0831 Gross per 24 hour  Intake 2496.62 ml  Output 3599 ml  Net -1102.38 ml   Filed Weights   04/28/24 0500 04/29/24 0411 04/30/24 0439  Weight: 107.9 kg 108.3 kg 105.8 kg    Examination: General: ill appearing man lying in bed in NAD neuro: awake, answering  some questions, but not oriented. Globally weak but moving extremities HEENT: [place]/AT, eyes anicteric RESP: breathing comfortably on [place], no rhonchi, reduced basilar breath sounds CV: S1S2, RRR GI: soft, NT. Wound vac, ostomies, drains in LUQ & RUQ GU: clear yellow urine Extremities: TED hose BLE, still has edema   BUN 50 Cr 1.67 on CRRT Phos 2.7 WBC 14.5 H/H  8.4/26.5  Platelets 217   Resolved problem list    Assessment and Plan   Robotic hernia repair-- incisional, parastomal, left inguinal Septic shock due to acute bowel perforation - OR 6/9, back to the OR 6/10 for closure and ileal resection and end ileostomy creation -con't prolonged course of antimicrobials due to need for mesh in contaminated abdomen. ID consulted per surgery, but no notes from them so far -remains off TPN & tolerating full dose TF- now on 14 hrs per day per surgery -remains off steriods & pressors  Acute hypoxemic respiratory failure - Extubated 6/14; mental status still makes him at risk for reintubation -pulmonary hygiene -wean O2 as able to maintain SpO2 >90%  Acute metabolic encephalopathy due to renal failure, uremia, ICU delirium -CRRT per Nephrology; will switch back ti iHD when filter clots (or before tomorrow morning) and plan for iHD 6/21 -avoid sedating meds as able -PT, OT, SLP  Acute kidney injury Acute metabolic acidosis due to renal failure hyperphosphatemia -CRRT per Nephrology; will switch back ti iHD when filter clots (or before tomorrow morning) and plan for iHD 6/21 -renally dose meds, avoid nephrotoxic meds -resume renvela as needed once off CRRT -strict I/O  Hypertension; antihypertensives -holding metoprolol  and amlodipine for now; can restart if BP rises off CRRT  History of diabetes; uncontrolled overnight while on TF -SSI PRN -TF coverage insulin  - novolog  6 units q4h while TF run -con't glargine 25 untis daily -SSI PRN -goal BG 140-180  Anemia Right flank  hematoma History of PE, was on Eliquis at home -heparin  -no plans to restart eliquis until we are less likely to need ICU procedures or PEG; d/w primary team  History of HFpEF -TED hose -volume off with CRRT, iHD  Moderate protein calorie malnutrition -appreciate SLP -TF nocturnal to encourage him to eat during the day; has been too encephalopathic to do that so far  Deconditioning Concern for dysphagia -PT, OT, SLP  Wife updated at bedside during rounds this morning.    Best Practice (right click and Reselect all SmartList Selections daily)   Diet/type: tubefeeds  DVT prophylaxis systemic heparin   Pressure ulcer(s): per nursing assessment GI prophylaxis: N/A Lines: Central line, PICC art line  Foley:  N/A urostomy  Code Status:  full code Last date of multidisciplinary goals of care discussion [spouse updated daily]   This patient is critically ill with multiple organ system failure which requires frequent high complexity decision making, assessment, support, evaluation, and titration of therapies. This was completed through the application of advanced monitoring technologies and extensive interpretation of multiple databases. During this  encounter critical care time was devoted to patient care services described in this note for 41 minutes.  Joesph Mussel, DO 04/30/24 10:27 AM [facility]  For contact information, see Amion. If no response to pager, please call PCCM consult pager. After hours, 7PM- 7AM, please call Elink.

## 2024-04-30 NOTE — Progress Notes (Addendum)
 Speech Language Pathology Treatment: Dysphagia  Patient Details Name: Kristopher Thompson MRN: [id number] DOB: 1951/10/24 Today's Date: 04/30/2024 Time: 4098-1191 SLP Time Calculation (min) (ACUTE ONLY): 19 min  Assessment / Plan / Recommendation Clinical Impression  Pt is more alert today but presents with delayed responses to questions. He followed commands given cueing. Strong coughing was observed immediately after trials of ice chips x1 but may be due to secretion mobilization. No further coughing was noted with trials of consecutive sips of thin liquids or with purees and solids. He needed extensive cueing to take bites of solids and then initiate mastication. As mentation seems to fluctuate significantly, recommend starting conservatively with a full liquid diet. POs should only be offered when pt is fully awake and alert. Given period of intubation and history of GERD, an instrumental swallow study may be warranted but SLP will follow closely to determine need vs advance diet clinically. Discussed with RN and pt's wife.    HPI HPI: Kristopher Thompson is a 73 yo male s/p hernia repair and urostomy revision on 5/30. SLP assessed 6/7 with recommendations to initiate Dys 2 diet with thin liquids as well as aspiration and esophageal precautions. Returned to OR 6/9 for perforated bowel, post op shock, s/p exploratory lapartomy with drainage of R abdominal wall abscess on 6/9. CRRT 6/11-6/14, restarted 6/17. Extubated 6/14. Transferred to [facility] 6/15 for intermittent HD. PMH includes CHF, colostomy, DM, GERD, abdominal surgery.      SLP Plan  Continue with current plan of care          Recommendations  Diet recommendations: Thin liquid Liquids provided via: Teaspoon;Cup;Straw Medication Administration: Crushed with puree Supervision: Staff to assist with self feeding;Full supervision/cueing for compensatory strategies Compensations: Minimize environmental distractions;Slow rate;Small  sips/bites;Clear throat intermittently;Multiple dry swallows after each bite/sip;Monitor for anterior loss Postural Changes and/or Swallow Maneuvers: Seated upright 90 degrees;Upright 30-60 min after meal                  Oral care QID   Frequent or constant Supervision/Assistance Dysphagia, unspecified (R13.10)     Continue with current plan of care     Amil Kale, M.A., CCC-SLP Speech Language Pathology, Acute Rehabilitation Services  Secure Chat preferred [phone]   04/30/2024, 11:33 AM

## 2024-04-30 NOTE — Progress Notes (Signed)
 PHARMACY - ANTICOAGULATION CONSULT NOTE  Pharmacy Consult for Heparin  Indication: h/o PE  Allergies  Allergen Reactions   Demerol  [Meperidine ] Nausea And Vomiting and Other (See Comments)    SEVERE NAUSEA    Patient Measurements: Height: 5' 11 (180.3 cm) Weight: 105.8 kg (233 lb 4 oz) IBW/kg (Calculated) : 75.3 HEPARIN  DW (KG): 100.8  Vital Signs: Temp: 98.1 F (36.7 C) (06/19 0729) Temp Source: Oral (06/19 0729) Pulse Rate: 83 (06/19 0700)  Labs: Recent Labs    04/28/24 0441 04/28/24 1821 04/29/24 0355 04/29/24 0356 04/29/24 1600 04/30/24 0330  HGB 8.7*  --  8.4*  --   --  8.4*  HCT 27.2*  --  26.5*  --   --  26.5*  PLT 329  --  305  --   --  217  HEPARINUNFRC 0.42  --   --  0.48  --  0.56  CREATININE 3.00*   < >  --  2.51* 1.97* 1.67*   < > = values in this interval not displayed.    Estimated Creatinine Clearance: 50.2 mL/min (A) (by C-G formula based on SCr of 1.67 mg/dL (H)).   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   moderate atypia - right low back   Atypical nevus 04/04/2007   moderate to marked - right upper back (wider shave)   Atypical nevus 04/04/2007   moderate atypia - center chest (wider shave)   Atypical nevus 04/04/2007   slight atypia - right thigh   Atypical nevus 11/29/2011   mild atypia - center upper back   Atypical nevus 11/29/2011   mild atypia - center chest   Bacteremia due to Klebsiella pneumoniae 10/09/2017   Bladder cancer (HCC) dx 07/2017   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) urologist-- dr Rozanne Corners   10-02-2012 s/p  prostatectomy-- Stage T1c   RBBB    Renal disorder    pt. denies   Sleep apnea    cpap   Squamous cell carcinoma of skin 05/22/2013   left cheek - CX3 + 5FU   Wears glasses     Assessment:  Patient with prolonged hospitalization including post-op complications resulting in bowel perforation with open abdomen. Has been closed and transferred to [facility]. On Eliquis PTA for hx of PE. Has been on heparin  gtt since 6/6.   AM heparin  level therapeutic at 0.56, HgB 8.4, and PLTs 217. No issues noted.   Goal of Therapy:  Heparin  level 0.3-0.7 units/ml Monitor platelets by anticoagulation protocol: Yes   Plan:  Continue heparin  at 1900u/hr.  Monitor for s/sx of bleeding.  Monitor CBC and heparin  level daily.  F/u in next few days about ability to transition back to DOAC. Awaiting improvement in renal fxn and mental status.    Mamie Searles, PharmD, BCCCP  04/30/2024 7:41 AM

## 2024-05-01 DIAGNOSIS — G9341 Metabolic encephalopathy: Secondary | ICD-10-CM | POA: Diagnosis not present

## 2024-05-01 DIAGNOSIS — I5032 Chronic diastolic (congestive) heart failure: Secondary | ICD-10-CM

## 2024-05-01 DIAGNOSIS — K631 Perforation of intestine (nontraumatic): Secondary | ICD-10-CM | POA: Diagnosis not present

## 2024-05-01 DIAGNOSIS — N179 Acute kidney failure, unspecified: Secondary | ICD-10-CM | POA: Diagnosis not present

## 2024-05-01 DIAGNOSIS — E119 Type 2 diabetes mellitus without complications: Secondary | ICD-10-CM | POA: Diagnosis not present

## 2024-05-01 LAB — GLUCOSE, CAPILLARY
Glucose-Capillary: 157 mg/dL — ABNORMAL HIGH (ref 70–99)
Glucose-Capillary: 146 mg/dL — ABNORMAL HIGH (ref 70–99)
Glucose-Capillary: 72 mg/dL (ref 70–99)
Glucose-Capillary: 111 mg/dL — ABNORMAL HIGH (ref 70–99)
Glucose-Capillary: 100 mg/dL — ABNORMAL HIGH (ref 70–99)
Glucose-Capillary: 105 mg/dL — ABNORMAL HIGH (ref 70–99)

## 2024-05-01 LAB — CBC
HCT: 25.1 % — ABNORMAL LOW (ref 39.0–52.0)
HCT: 26.3 % — ABNORMAL LOW (ref 39.0–52.0)
Hemoglobin: 7.9 g/dL — ABNORMAL LOW (ref 13.0–17.0)
Hemoglobin: 8.2 g/dL — ABNORMAL LOW (ref 13.0–17.0)
MCH: 28.5 pg (ref 26.0–34.0)
MCH: 28.7 pg (ref 26.0–34.0)
MCHC: 31.5 g/dL (ref 30.0–36.0)
MCHC: 31.2 g/dL (ref 30.0–36.0)
MCV: 90.6 fL (ref 80.0–100.0)
MCV: 92 fL (ref 80.0–100.0)
Platelets: 135 10*3/uL — ABNORMAL LOW (ref 150–400)
Platelets: 111 10*3/uL — ABNORMAL LOW (ref 150–400)
RBC: 2.77 MIL/uL — ABNORMAL LOW (ref 4.22–5.81)
RBC: 2.86 MIL/uL — ABNORMAL LOW (ref 4.22–5.81)
RDW: 16.3 % — ABNORMAL HIGH (ref 11.5–15.5)
RDW: 16.3 % — ABNORMAL HIGH (ref 11.5–15.5)
WBC: 13.6 10*3/uL — ABNORMAL HIGH (ref 4.0–10.5)
WBC: 16.4 10*3/uL — ABNORMAL HIGH (ref 4.0–10.5)
nRBC: 0 % (ref 0.0–0.2)
nRBC: 0 % (ref 0.0–0.2)

## 2024-05-01 LAB — RENAL FUNCTION PANEL
Albumin: 2 g/dL — ABNORMAL LOW (ref 3.5–5.0)
Anion gap: 5 (ref 5–15)
BUN: 38 mg/dL — ABNORMAL HIGH (ref 8–23)
CO2: 25 mmol/L (ref 22–32)
Calcium: 7.9 mg/dL — ABNORMAL LOW (ref 8.9–10.3)
Chloride: 105 mmol/L (ref 98–111)
Creatinine, Ser: 1.58 mg/dL — ABNORMAL HIGH (ref 0.61–1.24)
GFR, Estimated: 46 mL/min — ABNORMAL LOW (ref >60)
Glucose, Bld: 159 mg/dL — ABNORMAL HIGH (ref 70–99)
Phosphorus: 2.2 mg/dL — ABNORMAL LOW (ref 2.5–4.6)
Potassium: 4.9 mmol/L (ref 3.5–5.1)
Sodium: 135 mmol/L (ref 135–145)

## 2024-05-01 LAB — MAGNESIUM: Magnesium: 2.4 mg/dL (ref 1.7–2.4)

## 2024-05-01 LAB — HEPARIN LEVEL (UNFRACTIONATED): Heparin Unfractionated: 0.44 [IU]/mL (ref 0.30–0.70)

## 2024-05-01 MED ORDER — PIPERACILLIN-TAZOBACTAM IN DEX 2-0.25 GM/50ML IV SOLN
2.2500 g | Freq: Three times a day (TID) | INTRAVENOUS | Status: DC
  Administered 2024-05-01 – 2024-05-06 (×15): 2.25 g via INTRAVENOUS
  Filled 2024-05-01 (×17): qty 50

## 2024-05-01 MED ORDER — DARBEPOETIN ALFA 200 MCG/0.4ML IJ SOSY
200.0000 ug | PREFILLED_SYRINGE | INTRAMUSCULAR | Status: DC
  Administered 2024-05-01 – 2024-05-08 (×2): 200 ug via SUBCUTANEOUS
  Filled 2024-05-01 (×2): qty 0.4

## 2024-05-01 MED ORDER — CHLORHEXIDINE GLUCONATE CLOTH 2 % EX PADS
6.0000 | MEDICATED_PAD | Freq: Every day | CUTANEOUS | Status: DC
  Administered 2024-05-01 – 2024-05-22 (×20): 6 via TOPICAL

## 2024-05-01 MED ORDER — SODIUM CHLORIDE 0.9 % IV SOLN
100.0000 mg | INTRAVENOUS | Status: AC
  Administered 2024-05-01 – 2024-05-18 (×18): 100 mg via INTRAVENOUS
  Filled 2024-05-01 (×19): qty 5

## 2024-05-01 MED ORDER — IRON SUCROSE 200 MG IVPB - SIMPLE MED
200.0000 mg | Freq: Once | Status: AC
  Administered 2024-05-01: 200 mg via INTRAVENOUS
  Filled 2024-05-01: qty 200

## 2024-05-01 MED ORDER — ONDANSETRON HCL 4 MG/2ML IJ SOLN
4.0000 mg | Freq: Four times a day (QID) | INTRAMUSCULAR | Status: DC | PRN
  Administered 2024-05-01 – 2024-05-21 (×16): 4 mg via INTRAVENOUS
  Filled 2024-05-01 (×16): qty 2

## 2024-05-01 NOTE — Consult Note (Signed)
 WOC Nurse wound follow up Wound type: surgical  Measurement: see prior note dated 6/18 Wound bed: red, moist Drainage (amount, consistency, odor) serosanguinous in cannister Periwound: intact Dressing procedure/placement/frequency: Removed old NPWT dressing (removed 4 pieces black foam)  Filled wound with   __3__ piece of black foam  Sealed NPWT dressing at HG  Patient tolerated procedure well   Wound type: fissure to superior gluteal cleft Measurement: 0.7 cm x 0.2 cm Wound bed: red. moist Drainage (amount, consistency, odor) none Periwound: macerated  Dressing procedure/placement/frequency: Cleanse wound with NS, allow to air dry.  Place Xeroform over wound bed and cover with silicone foam dressing.  Change daily.  Place patient in Prevalon boots to off load heels bilaterally.   .  WOC Nurse ostomy follow up Stoma type/location: RLQ ileal conduit, RUQ ileostomy Stomal assessment/size:  RLQ ileal conduit: oval approximately 1 in x 1 1/4 in RUQ ileostomy 1 1/4 in round, slightly budded, os at 1 o'clock  Peristomal assessment:  Dips in abdominal topography surrounding ileal conduit Frank bleeding noted around the inferior aspect of ileostomy, skin intact, ostomy powder used around site for absorption. Treatment options for stomal/peristomal skin: 2 in barrier ring utilized around both stomas    Output Clear urine Liquid brown stool, with frank blood noted in ostomy pouch, nursing notified and presented to bedside, photos obtained  Ostomy pouching: 1pc. Soft convex urostomy and ileostomy pouch  Education provided: none- patient is alert with minimal interaction but does not present as ready and cognitive to participate Enrolled patient in [employer] Discharge program: No- will re-assess once status improves.      WOC team will continue to provide NPWT dressing changed due to the complexity of the dressing change and ostomy teaching when  appropriate.  Gillermo Lack, RN, MSN, [facility] WOC Team

## 2024-05-01 NOTE — Progress Notes (Signed)
 PHARMACY NOTE:  ANTIMICROBIAL RENAL DOSAGE ADJUSTMENT  Current antimicrobial regimen includes a mismatch between antimicrobial dosage and estimated renal function.  As per policy approved by the Pharmacy & Therapeutics and Medical Executive Committees, the antimicrobial dosage will be adjusted accordingly.  Current antimicrobial dosage:  Micafungin  150mg  every day  Indication: Intra-abdominal infection    Renal Function:  Estimated Creatinine Clearance: 53.1 mL/min (A) (by C-G formula based on SCr of 1.58 mg/dL (H)). []      On intermittent HD, scheduled: [x]      CRRT now off 6/20 AM     Antimicrobial dosage has been changed to:  Micafungin  100mg  every day, zosyn  2.25g Q8H   Additional comments: Will change zosyn  and dose for as if CrCL < 20 given UOP has been steadily decreasing, prior to CRRT start on 6/17 UOP was > 1L. Would have low threshold to increase dose if UOP picks up in next 24 hours.   Thank you for allowing pharmacy to be a part of this patient's care.  Mamie Searles, PharmD, BCCCP  05/01/2024 9:49 AM

## 2024-05-01 NOTE — Progress Notes (Signed)
 PHARMACY - ANTICOAGULATION CONSULT NOTE  Pharmacy Consult for Heparin  Indication: h/o PE  Allergies  Allergen Reactions   Demerol  [Meperidine ] Nausea And Vomiting and Other (See Comments)    SEVERE NAUSEA    Patient Measurements: Height: 5' 11 (180.3 cm) Weight: 105.8 kg (233 lb 4 oz) IBW/kg (Calculated) : 75.3 HEPARIN  DW (KG): 100.8  Vital Signs: Temp: 98.4 F (36.9 C) (06/20 1200) Temp Source: Oral (06/20 0408) BP: 133/62 (06/20 1300) Pulse Rate: 84 (06/20 1200)  Labs: Recent Labs    04/29/24 0356 04/29/24 1600 04/30/24 0330 04/30/24 1611 05/01/24 0417 05/01/24 1313  HGB  --   --  8.4*  --  7.9* 8.2*  HCT  --   --  26.5*  --  25.1* 26.3*  PLT  --   --  217  --  135* 111*  HEPARINUNFRC 0.48  --  0.56  --  0.44  --   CREATININE 2.51*   < > 1.67* 1.58* 1.58*  --    < > = values in this interval not displayed.    Estimated Creatinine Clearance: 53.1 mL/min (A) (by C-G formula based on SCr of 1.58 mg/dL (H)).   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   moderate atypia - right low back   Atypical nevus 04/04/2007   moderate to marked - right upper back (wider shave)   Atypical nevus 04/04/2007   moderate atypia - center chest (wider shave)   Atypical nevus 04/04/2007   slight atypia - right thigh   Atypical nevus 11/29/2011   mild atypia - center upper back   Atypical nevus 11/29/2011   mild atypia - center chest   Bacteremia due to Klebsiella pneumoniae 10/09/2017   Bladder cancer (HCC) dx 07/2017   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 denies   Sleep apnea    cpap   Squamous cell carcinoma of skin 05/22/2013   left cheek - CX3 + 5FU   Wears glasses     Assessment:  Patient with prolonged hospitalization including post-op complications resulting in bowel perforation with open abdomen. Has been closed and transferred to [facility]. On Eliquis PTA for hx of PE. Has been on heparin  gtt since 6/6.   AM heparin  level therapeutic at 0.44, HgB 7.9, and PLTs 135. Noted down-drift, no s/sx of bleeding, nephro started ESA + IV iron x1.    PM update: HgB up to 8.2 and no further bleeding from ileostomy.   Goal of Therapy:  Heparin  level 0.3-0.7 units/ml Monitor platelets by anticoagulation protocol: Yes   Plan:  Resume heparin  at 1900u/hr.  Monitor for s/sx of bleeding.  Monitor CBC and heparin  level daily.  F/u in next few days about ability to  transition back to DOAC. Awaiting improvement in renal fxn and mental status. Some discussion about potential need for PEG pending patient trajectory.   Mamie Searles, PharmD, BCCCP  05/01/2024 2:39 PM

## 2024-05-01 NOTE — Progress Notes (Addendum)
 PHARMACY - ANTICOAGULATION CONSULT NOTE  Pharmacy Consult for Heparin  Indication: h/o PE  Allergies  Allergen Reactions   Demerol  [Meperidine ] Nausea And Vomiting and Other (See Comments)    SEVERE NAUSEA    Patient Measurements: Height: 5' 11 (180.3 cm) Weight: 105.8 kg (233 lb 4 oz) IBW/kg (Calculated) : 75.3 HEPARIN  DW (KG): 100.8  Vital Signs: Temp: 97.7 F (36.5 C) (06/20 0800) Temp Source: Oral (06/20 0408) Pulse Rate: 87 (06/20 0900)  Labs: Recent Labs    04/29/24 0355 04/29/24 0356 04/29/24 1600 04/30/24 0330 04/30/24 1611 05/01/24 0417  HGB 8.4*  --   --  8.4*  --  7.9*  HCT 26.5*  --   --  26.5*  --  25.1*  PLT 305  --   --  217  --  135*  HEPARINUNFRC  --  0.48  --  0.56  --  0.44  CREATININE  --  2.51*   < > 1.67* 1.58* 1.58*   < > = values in this interval not displayed.    Estimated Creatinine Clearance: 53.1 mL/min (A) (by C-G formula based on SCr of 1.58 mg/dL (H)).   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   moderate atypia - right low back   Atypical nevus 04/04/2007   moderate to marked - right upper back (wider shave)   Atypical nevus 04/04/2007   moderate atypia - center chest (wider shave)   Atypical nevus 04/04/2007   slight atypia - right thigh   Atypical nevus 11/29/2011   mild atypia - center upper back   Atypical nevus 11/29/2011   mild atypia - center chest   Bacteremia due to Klebsiella pneumoniae 10/09/2017   Bladder cancer (HCC) dx 07/2017   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 denies   Sleep apnea    cpap   Squamous cell carcinoma of skin 05/22/2013   left cheek - CX3 + 5FU   Wears glasses     Assessment:  Patient with prolonged hospitalization including post-op complications resulting in bowel perforation with open abdomen. Has been closed and transferred to [facility]. On Eliquis PTA for hx of PE. Has been on heparin  gtt since 6/6.   AM heparin  level therapeutic at 0.44, HgB 7.9, and PLTs 135. Noted down-drift, no s/sx of bleeding, nephro started ESA + IV iron x1.    Goal of Therapy:  Heparin  level 0.3-0.7 units/ml Monitor platelets by anticoagulation protocol: Yes   Plan:  Continue heparin  at 1900u/hr.  Monitor for s/sx of bleeding.  Monitor CBC and heparin  level daily.  F/u in next few days about ability to transition back to DOAC. Awaiting improvement in renal fxn and mental status. Some discussion about potential need for  PEG pending patient trajectory.   Mamie Searles, PharmD, BCCCP  05/01/2024 9:42 AM  ADDENDUM:   Some bleeding noted in ileostomy, spoke with CCM will pause heparin  and re-check H/H at 14:00. Will consider resuming if HgB stable.

## 2024-05-01 NOTE — Progress Notes (Signed)
 Speech Language Pathology Treatment: Dysphagia  Patient Details Name: Kristopher Thompson MRN: [id number] DOB: 1951-05-13 Today's Date: 05/01/2024 Time: 4098-1191 SLP Time Calculation (min) (ACUTE ONLY): 9 min  Assessment / Plan / Recommendation Clinical Impression  Pt more alert and interactive today. He took sips of thin liquids without signs clinically concerning for aspiration and achieved complete oral clearance with solids. Mastication; however, remains moderately prolonged. Per MD note, okay to gradually upgrade diet so will advance to Dys 2 solids and continue thin liquids. Discussed with pt's wife and RN. SLP will continue following.    HPI HPI: Kristopher Thompson is a 73 yo male s/p hernia repair and urostomy revision on 5/30. SLP assessed 6/7 with recommendations to initiate Dys 2 diet with thin liquids as well as aspiration and esophageal precautions. Returned to OR 6/9 for perforated bowel, post op shock, s/p exploratory lapartomy with drainage of R abdominal wall abscess on 6/9. CRRT 6/11-6/14, restarted 6/17. Extubated 6/14. Transferred to [facility] 6/15 for intermittent HD. PMH includes CHF, colostomy, DM, GERD, abdominal surgery.      SLP Plan  Continue with current plan of care          Recommendations  Diet recommendations: Dysphagia 2 (fine chop);Thin liquid Liquids provided via: Cup;Straw Medication Administration: Whole meds with liquid Supervision: Staff to assist with self feeding;Full supervision/cueing for compensatory strategies Compensations: Minimize environmental distractions;Slow rate;Small sips/bites;Clear throat intermittently;Multiple dry swallows after each bite/sip;Monitor for anterior loss Postural Changes and/or Swallow Maneuvers: Seated upright 90 degrees;Upright 30-60 min after meal                  Oral care BID   Frequent or constant Supervision/Assistance Dysphagia, unspecified (R13.10)     Continue with current plan of care     Amil Kale, M.A., CCC-SLP Speech Language Pathology, Acute Rehabilitation Services  Secure Chat preferred [phone]   05/01/2024, 10:50 AM

## 2024-05-01 NOTE — Progress Notes (Signed)
  [facility] PROGRESS NOTE  Assessment/ Plan: Pt is a 73 y.o. yo male  with  hypertension, DM, CAD, bladder cancer, CHF, right bundle branch block who had acute bowel perforation and status post OR for bowel repair on 6/8, lysis of adhesion, ileal resection end ileostomy hernia repairs, septic shock seen for AKI and oliguria.   # Acute kidney injury, oliguric with anasarca likely ischemic ATN due to septic shock and obstructive uropathy given bilateral hydronephrosis.  CRRT 6/11-6/14 -  Because of the patient's persistent altered mental status and high metabolic state with elevated BUN we putting back on CRRT on 6/17.  I think can try to hold CRRT and see how he does   # Bilateral hydronephrosis severe on the left side: S/p revision of ileal conduit 5/30,  urology following.    # Acute bowel perforation is status post bowel repair ileal conduit and hernia repairs urostomy ileal conduit revision.  As per surgical team.    # HTN: Continue current medications and adjust volume status with dialysis as tolerated   # Acute hypoxic respiratory failure/VDRF per pulmonary team. Extubated 6/14, currently on nasal cannula  # Altered mental status: Likely multifactorial.  Uremia may have played some role.  Dialysis as above  Anemia-  add ESA and check iron stores     Subjective:   remains on CRRT-  UOP is decreasing-  was 298 last 24 hours-  ended up around 400 negative last 234 hours on CRRT -  wife said really good day yesterday-  he is alert-  slow to speech but it is there  Objective Vital signs in last 24 hours: Vitals:   05/01/24 0615 05/01/24 0630 05/01/24 0645 05/01/24 0700  BP:      Pulse: 92 89 90 80  Resp: 20 19 20 16   Temp:      TempSrc:      SpO2: 100% 100% 100% 100%  Weight:      Height:       Weight change: 0 kg  Intake/Output Summary (Last 24 hours) at 05/01/2024 0747 Last data filed at 05/01/2024 0700 Gross per 24 hour  Intake 2760.86 ml  Output  3125.6 ml  Net -364.74 ml       Labs: RENAL PANEL Recent Labs  Lab 04/27/24 0453 04/28/24 0441 04/28/24 1821 04/29/24 0355 04/29/24 0356 04/29/24 1600 04/30/24 0330 04/30/24 1611 05/01/24 0417  NA 141  141 138   < >  --  139 139 135 135 135  K 4.0  4.0 3.9   < >  --  4.3 4.5 4.7 4.9 4.9  CL 108  108 102   < >  --  104 104 104 105 105  CO2 18*  17* 23   < >  --  23 25 23 23 25   GLUCOSE 183*  183* 154*   < >  --  164* 120* 171* 145* 159*  BUN 142*  144* 107*   < >  --  88* 66* 50* 49* 38*  CREATININE 3.99*  3.87* 3.00*   < >  --  2.51* 1.97* 1.67* 1.58* 1.58*  CALCIUM  8.1*  8.4* 8.1*   < >  --  8.0* 8.1* 7.8* 7.9* 7.9*  MG 2.6* 2.2  --  2.3  --   --  2.4  --  2.4  PHOS 7.3*  7.3* 5.1*   < >  --  3.7 3.2 2.7 2.4* 2.2*  ALBUMIN  1.8*  1.8* 2.1*   < >  --  2.1* 2.1* 2.0* 2.1* 2.0*   < > = values in this interval not displayed.    Liver Function Tests: Recent Labs  Lab 04/26/24 0644 04/27/24 0453 04/28/24 0441 04/30/24 0330 04/30/24 1611 05/01/24 0417  AST 25 20  --   --   --   --   ALT 33 29  --   --   --   --   ALKPHOS 58 58  --   --   --   --   BILITOT 0.9 0.6  --   --   --   --   PROT 5.6* 5.5*  --   --   --   --   ALBUMIN  2.0* 1.8*  1.8*   < > 2.0* 2.1* 2.0*   < > = values in this interval not displayed.   No results for input(s): LIPASE, AMYLASE in the last 168 hours. No results for input(s): AMMONIA in the last 168 hours. CBC: Recent Labs    04/27/24 0453 04/28/24 0441 04/29/24 0355 04/30/24 0330 05/01/24 0417  HGB 8.8* 8.7* 8.4* 8.4* 7.9*  MCV 89.4 89.5 89.2 91.4 90.6    Cardiac Enzymes: No results for input(s): CKTOTAL, CKMB, CKMBINDEX, TROPONINI in the last 168 hours. CBG: Recent Labs  Lab 04/30/24 1121 04/30/24 1546 04/30/24 1959 04/30/24 2355 05/01/24 0406  GLUCAP 89 156* 137* 132* 157*    Iron Studies: No results for input(s): IRON, TIBC, TRANSFERRIN, FERRITIN in the last 72  hours. Studies/Results: No results found.    Medications: Infusions:  sodium chloride  Stopped (05/01/24 0014)   albumin  human     heparin  1,900 Units/hr (05/01/24 0700)   iron sucrose     micafungin  (MYCAMINE ) 150 mg in sodium chloride  0.9 % 100 mL IVPB Stopped (04/30/24 2013)   piperacillin -tazobactam (ZOSYN )  IV 12.5 mL/hr at 05/01/24 0700   prismasol  BGK 4/2.5 400 mL/hr at 05/01/24 0731   prismasol  BGK 4/2.5 400 mL/hr at 05/01/24 0730   prismasol  BGK 4/2.5 2,000 mL/hr at 05/01/24 1610    Scheduled Medications:  Chlorhexidine  Gluconate Cloth  6 each Topical Daily   feeding supplement (PROSource TF20)  60 mL Per Tube BID   feeding supplement (VITAL 1.5 CAL)  1,440 mL Per Tube Q24H   insulin  aspart  0-20 Units Subcutaneous Q4H   insulin  aspart  6 Units Subcutaneous 4 times per day   insulin  glargine-yfgn  25 Units Subcutaneous QHS   loperamide HCl  2 mg Per Tube QHS   nutrition supplement (JUVEN)  1 packet Per Tube BID BM   mouth rinse  15 mL Mouth Rinse 4 times per day   polycarbophil  625 mg Per Tube BID    have reviewed scheduled and prn medications.  Physical Exam: General: nad, sleepy but arousable, able to answer some questions Heart:normal rate, no rub Lungs: Coarse breath sound bilateral.  Bilateral chest rise, no iwob Abdomen: Mild distention, no pain with palpation Extremities: trace  edema, warm and well-perfused Dialysis Access: LIJ Temporary HD line c/d/I-  in for 8 days   Dollye Glasser A Ellia Knowlton 05/01/2024,7:47 AM  LOS: 21 days

## 2024-05-01 NOTE — Progress Notes (Signed)
 NAME:  Kristopher FANIEL, MRN:  [id number], DOB:  [birth year], LOS: 21 ADMISSION DATE:  04/10/2024, CONSULTATION DATE:  04/13/2024 REFERRING MD:  Dr Bonita Bussing, CHIEF COMPLAINT: Sepsis  History of Present Illness:  S/p repair of incisional/parastomal and ventral hernias, left inguinal incarcerated , abdominal hernias, lysis of adhesions, urostomy revision  Transferred to the ICU secondary to tachycardia, tachypnea  History of bladder cancer, prostate cancer, diastolic heart failure, diabetes, right bundle branch block, multiple abdominal surgeries  Update 04/20/24: ccm reconsulted post operatively after pt had perforated bowel and returned to OR. Pt was requiring neo and norepi. He remains intubated at this time. Profoundly acidotic with bicarb of 12.  Started on sodium bicarb infusion and bolus prior to initiation. Follow labs in am. Updated wife and son at bedside.   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   moderate atypia - right low back   Atypical nevus 04/04/2007   moderate to marked - right upper back (wider shave)   Atypical nevus 04/04/2007   moderate atypia - center chest (wider shave)   Atypical nevus 04/04/2007   slight atypia - right thigh   Atypical nevus 11/29/2011   mild atypia - center upper back   Atypical nevus 11/29/2011   mild atypia - center chest   Bacteremia due to Klebsiella pneumoniae 10/09/2017   Bladder cancer (HCC) dx 07/2017   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 denies   Sleep apnea    cpap   Squamous cell carcinoma of skin 05/22/2013   left cheek - CX3 + 5FU   Wears glasses    Significant Hospital Events: Including procedures, antibiotic start and stop dates in addition to other pertinent events   04/10/2024-laparoscopic surgery 04/13/24 CCM consult. Incr NE. Art line  6/3 weaning NE. Gently diuresed w 3L off  6/4 off NE. Pulled out his NGT refused replacement. After multiple emesis  6/5 cont NGT 6/6 tx to med surg 6/9: taken back to OR for perforated bowel, post-op shock 6/10: Norepinephrine , Phenylephrine , and vasopressin , added steroids, 1u PRBCs, back to OR for washout and closure 6/11: Worsening AKI with fluid overload, start CRRT, decreasing pressor requirements 6/13 tolerating CRRT 6/14: Off CRRT, arousable.  Tolerated weaning for most of the day 6/13 6/14: Extubated, CRRT stopped. 6/15 transferred to [facility] for iHD; montoring for renal recovery 6/16 iHD, severely encephalopathic with high BUN 6/17 restarted CRRT for to ongoing uremia and encephalopathy 6/18 was awake enough to work with PT, OT  Interim History / Subjective:  No overnight issues CRRT being stopped Remain off vasopressors, on room air  Objective    Blood pressure (!) 117/51, pulse 87, temperature 97.7 F (36.5 C), resp. rate  17, height 5' 11 (1.803 m), weight 105.8 kg, SpO2 100%.        Intake/Output Summary (Last 24 hours) at 05/01/2024 0949 Last data filed at 05/01/2024 0900 Gross per 24 hour  Intake 2728.87 ml  Output 2966.5 ml  Net -237.63 ml   Filed Weights   04/29/24 0411 04/30/24 0439 05/01/24 0443  Weight: 108.3 kg 105.8 kg 105.8 kg    Examination: General: Chronically ill-appearing obese elderly male, lying on the bed HEENT: [place]/AT, eyes anicteric.  moist mucus  membranes. cortrak in place Neuro: Alert, awake following commands.  Generalized weak Chest: Coarse breath sounds, no wheezes or rhonchi Heart: Regular rate and rhythm, no murmurs or gallops Abdomen: Wound VAC in place with ostomy and drains  Labs and images reviewed  Patient Lines/Drains/Airways Status     Active Line/Drains/Airways     Name Placement date Placement time Site Days   Arterial Line 04/20/24 Right Radial 04/20/24  1015  Radial  11   PICC Triple Lumen 04/13/24 Right Basilic 39 cm 1 cm 04/13/24  8657  -- 18   Hemodialysis Catheter Left Internal jugular Triple lumen Temporary (Non-Tunneled) 04/22/24  1123  Internal jugular  9   Closed System Drain 1 Right;Lateral Abdomen Bulb (JP) 19 Fr. 04/10/24  --  Abdomen  21   Closed System Drain 1 Left;Lateral Abdomen Bulb (JP) 19 Fr. 04/10/24  --  Abdomen  21   Negative Pressure Wound Therapy Abdomen Medial;Upper 04/20/24  0150  --  11   Ileostomy RUQ 04/21/24  1520  RUQ  10   Urostomy Ileal conduit RLQ 12/07/18  0135  RLQ  1972   Incision - 6 Ports Abdomen Left;Lateral Left;Mid Right;Lateral Mid 04/10/24  --  -- 21   Small Bore Feeding Tube 10 Fr. Left nare Marking at nare/corner of mouth 60 cm 04/27/24  1334  Left nare  4   Wound 04/26/24 1715 Pressure Injury Sacrum Mid Stage 2 -  Partial thickness loss of dermis presenting as a shallow open injury with a red, pink wound bed without slough. 04/26/24  1715  Sacrum  5        Resolved problem list  Septic shock Acute respiratory failure with hypoxia  Assessment and Plan  Robotic hernia repair-- incisional, parastomal, left inguinal Septic shock due to acute peritonitis caused by bowel perforation - OR 6/9, back to the OR 6/10 for closure and ileal resection and end ileostomy creation General Surgery is following Off TPN Continue prolonged course of antibiotic therapy discussed between general surgery and ID Continue tube feeds 14 hours/day Encourage oral intake so we can stop  tube feeds Wound VAC management per general surgery Shock has resolved  Acute metabolic encephalopathy due to renal failure, uremia, ICU delirium Mental status has improved Avoid sedation  Acute kidney injury due to ischemic ATN from shock Acute metabolic acidosis due to renal failure hyperphosphatemia CRRT is being stopped today Plan for IHD tomorrow Nephrology is following  Diabetes type 2 Blood sugars are controlled Continue insulin  with CBG goal 140-180  Acute blood loss anemia Right flank hematoma History of PE, was on Eliquis at home H&H slightly trended down overnight from 8.4-7.9 Monitor H&H and transfuse if less than 7 Continue IV heparin  infusion, plan to transition to Eliquis once H&H is stable for few days  Chronic HFpEF Looks euvolemic, GDMT as tolerated  Moderate protein calorie malnutrition Obesity Continue dietary supplements Diet Counseling provided Continue full liquid diet  Deconditioning PT/OT evaluation  Best  Practice (right click and Reselect all SmartList Selections daily)   Diet/type: tubefeeds full liquid DVT prophylaxis systemic heparin   Pressure ulcer(s): per nursing assessment GI prophylaxis: N/A Lines: Central line, PICC art line  Foley:  N/A urostomy  Code Status:  full code Last date of multidisciplinary goals of care discussion: 6/20 spouse updated  at bedside     Trevor Fudge, MD [facility] See Amion for pager If no response to pager, please call [phone] until 7pm After 7pm, Please call E-link [phone]

## 2024-05-01 NOTE — Progress Notes (Signed)
 eLink Physician-Brief Progress Note Patient Name: Kristopher Thompson DOB: October 13, 1951 MRN: [id number]   Date of Service  05/01/2024  HPI/Events of Note  73 year old status post hernia repair and urostomy revision on 5/30 complicated by perforated bowel, postop shock, and abdominal wall abscess with the new ileostomy in place  Started to develop trace hematochezia through the ileostomy.  Patient is on systemic anticoagulation for pulmonary embolus.  eICU Interventions  Minimal drop in hemoglobin.  Defer transition to Eliquis until discussion with surgical team     Intervention Category Intermediate Interventions: Bleeding - evaluation and treatment with blood products  Wrenly Lauritsen 05/01/2024, 5:12 AM

## 2024-05-01 NOTE — TOC Progression Note (Addendum)
 Transition of Care [facility]) - Progression Note    Patient Details  Name: Kristopher Thompson MRN: [id number] Date of Birth: March 09, 1951  Transition of [facility]) CM/SW Contact  Juliane Och, LCSW Phone Number: 05/01/2024, 11:13 AM  Clinical Narrative:     11:13 AM Per progressions, patient is not yet medically ready for discharge to [facility].   Expected Discharge Plan: Skilled Nursing Facility Barriers to Discharge: Continued Medical Work up, SNF Pending bed offer, English as a second language teacher  Expected Discharge Plan and Services In-house Referral: Clinical Social Work Discharge Planning Services: CM Consult Post Acute Care Choice: Skilled Nursing Facility Living arrangements for the past 2 months: Single Family Home                                       Social Determinants of Health (SDOH) Interventions SDOH Screenings   Food Insecurity: No Food Insecurity (04/12/2024)  Housing: Low Risk  (04/12/2024)  Transportation Needs: No Transportation Needs (04/12/2024)  Utilities: Not At Risk (04/12/2024)  Social Connections: Socially Integrated (04/10/2024)  Tobacco Use: Medium Risk (04/10/2024)    Readmission Risk Interventions    04/12/2024    4:58 PM  Readmission Risk Prevention Plan  Transportation Screening Complete  PCP or Specialist Appt within 3-5 Days Complete  HRI or Home Care Consult Complete  Social Work Consult for Recovery Care Planning/Counseling Complete  Palliative Care Screening Not Applicable  Medication Review Oceanographer) Complete

## 2024-05-01 NOTE — Progress Notes (Addendum)
 05/01/2024  Kristopher Thompson [id number] 11-03-1951  CARE TEAM: PCP: Dudley Ghee, MD  Outpatient Care Team: Patient Care Team: Dudley Ghee, MD as PCP - General (Internal Medicine) Devon Fogo, MD (Inactive) as Consulting Physician (Dermatology) Lavina Pou, MD as Referring Physician (Pulmonary Disease) Candyce Champagne, MD as Consulting Physician (General Surgery) Florencio Hunting, MD as Consulting Physician (Urology)  Inpatient Treatment Team: Treatment Team:  Candyce Champagne, MD Etter Hermann., MD Florencio Hunting, MD Pccm, Md, MD Clementine Cutting, MD Catheline Clos, RN Baron Border, MD Terre Ferri, MD Idolina Maker, RN Carlyle Childes, RN Poff, Mariel Shope, PTA Vertell Gory, RN Synthia Ewing, [facility] Clearnce Curia, RN Garvin Kaplan, CCC-SLP   Problem List:   Principal Problem:   Delayed bowel perforation s/p SBR/end ileostomy Active Problems:   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 08/08/2017   S/P ileal conduit (HCC)   GERD (gastroesophageal reflux disease)   Obesity (BMI 35.0-39.9 without comorbidity)   Prolonged QT interval   Non-recurrent bilateral inguinal hernia without obstruction or gangrene   Incarcerated incisional hernia   Sinus tachycardia   Tachypnea   Acute respiratory insufficiency, postoperative   Sepsis due to undetermined organism (HCC)   Lactic acidosis   Class 2 obesity   Chronic anticoagulation   Hearing loss   History of bladder cancer   Obstructive sleep apnea of adult   Parastomal hernia of ileal conduit   Partial small bowel obstruction (HCC)   Personal history of PE (pulmonary embolism)   Pressure injury of skin   04/10/2024  POST-OPERATIVE DIAGNOSIS:  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   PROCEDURE:   -ROBOTIC LYSIS OF ADHESIONS X 4 HOURS -COMPONENT SEPARATION - TRANSVERSUS ABDOMINIS REALEASE (TAR) BILATERAL -ROBOTIC REPAIRS OF  INCISIONAL,  PARASTOMAL, LEFT INGUINAL  INCARCERATED ABDOMINAL WALL HERNIAS WITH MESH -UROSTOMY ILEAL CONDUIT REVISION -INTRAOPERATIVE ASSESSMENT OF TISSUE VASCULAR PERFUSION USING ICG (indocyanine green ) -IMMUNOFLUORESCENCE -TRANSVERSUS ABDOMINIS PLANE (TAP) BLOCK - BILATERAL    SURGEON:  Eddye Goodie, MD  OR FINDINGS:  Patient had dense adhesions anterior abdominal wall.  Largest hernia was in the right lower quadrant around the ileal conduit urostomy.  16 x 12 cm region.  Incarcerated with over a foot of small bowel.  Midline next largest 9 x 8 cm incarcerated with numerous loops of small bowel.  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  Another direct space hernia in the left lower quadrant and indirect hernia on the left lower quadrant.  No obvious hernias on the right side.   Component separation's done on both sides to try and get the largest midline and parastomal hernias close down with running serrated try to fix absorbable suture.  Broad underlay repair with 30 x 35 cm mesh transverse  04/20/2024 POST-OPERATIVE DIAGNOSIS:  perforated bowel   PROCEDURE:  Drainage of right abdominal wall abscess as well as intra-abdominal abscess Explantation of abdominal mesh Small bowel resection (in discontinuity) Placement of ABThera wound VAC   SURGEON:  Aldean Hummingbird, MD  ASSISTANTS: Adalberto Acton, MD   04/21/2024  POST-OPERATIVE DIAGNOSIS:  DELAYED BOWEL PERFORATION WITH OPEN ABDOMEN   PROCEDURE:   LYSIS OF ADHESIONS X ABDOMINAL WALL DEBRIDEMENT ILEAL RESECTION END ILEOSTOMY ABDOMINAL WALL PARASTOMAL & INCISIONAL HERNIA REPAIRS WITH PHASIX MESH FASCIA CLOSURE WITH WOUND VAC PLACEMENT IN SQ   SURGEON:  Eddye Goodie, MD   ASSISTANT:  [name], MD   Ostomy:  Right upper quadrant: End ileostomy Right lower quadrant: Urostomy ileal conduit   Drains: 19 Fr Blake drains x2   Right lower quadrant drain rests in the right panniculus abdominal wall  between the Phasix mesh and abdominal wall   Left lower quadrant drain rest in the anterior peritoneum between the mesh and the bowel with the tip resting in the right lower quadrant near the base of the ileal conduit and prior bowel resection  Assessment [facility] Stay = 21 days) 10 Days Post-Op    Stabilizing but still very challenged  Plan:  Shock resolved.  Nutrition: Bowel function returned.  Cycling tube feeds.  Trying 176mL/hour x 14 hours during evening/night.  Fiber to thicken ileostomy effluent -  soluble polyfiber/FiberCon  Add loperamide to avoid too much high output ileostomy.  Weaned off TPN 6/16   PO as tolerated.  Patient more alert and talking today.  On a full liquid diet.  See if he can go from a dysphagia 1 to dysphagia 3 diet gradually.  If making decent effort can try calorie counts and start tapering off tube feeds over the next week.  I suspect it will be a very gradual process  Hold off on G-tube unless poor PO effort >14days  Infection due to delayed bowel perforation and abdominal wall infection/necrosis  Growing multiple organisms including Candida albicans, Enterobacter, and Enterococcus.    IV Zosyn /micafungin .  Anticipate prolonged antibiotics given the abdominal wall infection/necrosis and need for slowly absorbing Phasix mesh.   Discussed with Dr. Gillian Lacrosse with infectious disease.  The new Phasix mesh is absorbable over the next year or so.  Hopefully that lowers the risk of chronic infection but suspect he may need chronic antibiotics.    Change wound vac q MonWedFri the hopes for the subcutaneous tissues to recover.  Last change 6/18 showing early granulation without necrosis guardedly reassuring.     AKI in the setting of urostomy ileal conduit and shock.    Initially oliguric and now higher volume.    Intraperitoneal surgical Blake fluid creatinine 6/12 same as serum argues against leak.  Dr Rozanne Corners aware  CRRTx most likely stopping  today with transition of as needed intermittent HD to help clear uremia/wastes per nephrology.  Most likely will transition to intermittent since uremia better and mental status better.  Metabolic encephalopathy with all the stresses he has been through, especially Uremia.    CRRTx to help -markedly improved.  Follow closely   CT of chest negative for any obvious pulmonary embolism or pneumonia.  Oxygen needs less.  Echocardiogram shows grade 1 diastolic dysfunction which I believe is his baseline.  Defer to critical care/internal medicine they feel further is needed aside for some monitor diuresis.  ABLA on top of anemia of chronic disease improved with transfusion.  Hemoglobin drifting down most likely due to CRRTx and heparin .  I wrote for IV iron.  Transfuse if hemoglobin less than 7 or returns in shock.  -monitor electrolytes & replace as needed.  Keep K>4, Mg>2, Phos>3.    -Diabetes.  Sliding scale insulin .    -VTE prophylaxis-  Full anticoagulation heparin  drip given history of pulmonary embolism,.  Could consider back on DOAC enteral route once off CRRTx  -Mobilize as tolerated.  Agree with critical care about having physical and Occupational Therapy get more involved now that he is more alert.  Hopefully can rebuild strength and minimize further atrophy.  Tentatively saying FL 2 discussion about rehab.  I do not want this  patient going to an LTAC.  He is not ready.  I updated the patient's status to the the patient & ICU RN in room.  Recommendations were made.  Questions were answered.  They expressed understanding & appreciation.  -Disposition: He is going to be here a while       I reviewed nursing notes, last 24 h vitals and pain scores, last 48 h intake and output, last 24 h labs and trends, and last 24 h imaging results.  I have reviewed this patient's available data, including medical history, events of note, test results, etc as part of my evaluation.   A significant  portion of that time was spent in counseling. Care during the described time interval was provided by me.  This care required high  level of medical decision making.  05/01/2024    Subjective: (Chief complaint)  On CRRTx More alert & following commands.  Answering questions Denies pain ICU nurse in room Wife at bedside Did have some full liquids bites without coughing  Objective:  Vital signs:  Vitals:   05/01/24 0615 05/01/24 0630 05/01/24 0645 05/01/24 0700  BP:      Pulse: 92 89 90 80  Resp: 20 19 20 16   Temp:      TempSrc:      SpO2: 100% 100% 100% 100%  Weight:      Height:        Last BM Date : 04/30/24  Intake/Output   Yesterday:  06/19 0701 - 06/20 0700 In: 2760.9 [P.O.:210; I.V.:455.5; UE/AV:4098.1; IV Piggyback:248.7] Out: 3125.6 [Urine:298; Drains:439; Stool:1135] This shift:  No intake/output data recorded.  Bowel function:  Flatus: YES  BM:  YES -small  Drains:  RLQ drain (rests between abdominal wall and Phasix mesh): Serous    LLQ drain (runs over bowel with tip down in RLQ pelvis near base of ileal conduit and site of bowel resection):  thinly serosanguinous   Wound vac (in SQ over closed fascia):  serosanguineous   Physical Exam:  General: Awake but not providing much eye contact.  Follows a few commands.  Answering simple questions.  Not agitated Eyes: PERRL, normal EOM.  Sclera clear.  No icterus Neuro: CN II-XII intact w/o focal sensory/motor deficits. Lymph: No head/neck/groin lymphadenopathy Psych:  No delerium/psychosis/paranoia.  No agitation HENT: Normocephalic, Mucus membranes moist.  No thrush.  ETT & NGT in place Neck: Supple, No tracheal deviation.  No obvious thyromegaly Chest: No pain to chest wall compression.  Good respiratory excursion.   CV:  Pulses intact.  Regular rhythm.  1-2+ BUE/BLE edema MS:  No obvious deformity  Abdomen:  Obese but Soft.  Nondistended.  Wound vac in midline.    Flank ecchymosis bilateral  posterior resolving.  Abdominal edema mostly resolved   RUQ: (End ileostomy): Tan oatmeal thick effluent in bag RLQ:  (Urostomy ileal conduit): pink with clear light yellow-colored urine  Ext:   No deformity.  Bilateral hand 1+ edema greater than lower extremity   legs without any edema.  No cyanosis Skin: No petechiae / purpurea.  No major sores.  Warm and dry    Results:   Cultures: Recent Results (from the past 720 hours)  Culture, blood (Routine X 2) w Reflex to ID Panel     Status: None   Collection Time: 04/13/24  7:13 AM   Specimen: BLOOD  Result Value Ref Range Status   Specimen Description   Final    BLOOD BLOOD LEFT ARM AEROBIC BOTTLE ONLY ANAEROBIC  BOTTLE ONLY Performed at [facility], [address], [place], [place] [address]    Special Requests   Final    BOTTLES DRAWN AEROBIC AND ANAEROBIC Blood Culture results may not be optimal due to an inadequate volume of blood received in culture bottles Performed at [facility], [address] [place], [place] [address]    Culture   Final    NO GROWTH 5 DAYS Performed at [facility], [address] [place], [place] [address]    Report Status 04/18/2024 FINAL  Final  Culture, blood (Routine X 2) w Reflex to ID Panel     Status: None   Collection Time: 04/13/24  7:20 AM   Specimen: BLOOD  Result Value Ref Range Status   Specimen Description   Final    BLOOD BLOOD RIGHT ARM AEROBIC BOTTLE ONLY ANAEROBIC BOTTLE ONLY Performed at [facility], [address] [place], [place] [address]    Special Requests   Final    BOTTLES DRAWN AEROBIC AND ANAEROBIC Blood Culture results may not be optimal due to an inadequate volume of blood received in culture bottles Performed at [facility], [address] [place], [place] [address]    Culture   Final    NO GROWTH 5 DAYS Performed at [facility], [address] [place], [place]  [address]    Report Status 04/18/2024 FINAL  Final  Urine Culture     Status: None   Collection Time: 04/13/24 10:18 AM   Specimen: Urine, Clean Catch  Result Value Ref Range Status   Specimen Description   Final    URINE, CLEAN CATCH Performed at [facility], [address], [place], [place] [address]    Special Requests   Final    NONE Performed at [facility], [address] [place], [place] [address]    Culture   Final    NO GROWTH Performed at [facility], [address] [place], [place] [address]    Report Status 04/14/2024 FINAL  Final  MRSA Next Gen by PCR, Nasal     Status: None   Collection Time: 04/13/24 11:35 AM   Specimen: Nasal Mucosa; Nasal Swab  Result Value Ref Range Status   MRSA by PCR Next Gen NOT DETECTED NOT DETECTED Final    Comment: (NOTE) The GeneXpert MRSA Assay (FDA approved for NASAL specimens only), is one component of a comprehensive MRSA colonization surveillance program. It is not intended to diagnose MRSA infection nor to guide or monitor treatment for MRSA infections. Test performance is not FDA approved in patients less than 28 years old. Performed at [facility], [address], [place], [place] [address]   Culture, blood (Routine X 2) w Reflex to ID Panel     Status: None   Collection Time: 04/20/24  8:46 AM   Specimen: BLOOD RIGHT ARM  Result Value Ref Range Status   Specimen Description   Final    BLOOD RIGHT ARM Performed at [facility], [address] [place], [place] [address]    Special Requests   Final    BOTTLES DRAWN AEROBIC AND ANAEROBIC Blood Culture results may not be optimal due to an inadequate volume of blood received in culture bottles Performed at [facility], [address], [place], [place] [address]    Culture   Final    NO GROWTH 5  DAYS Performed at [facility], [address], [place], [place] [address]     Report Status 04/25/2024 FINAL  Final  Culture, blood (Routine X 2) w Reflex to ID Panel     Status: None   Collection Time: 04/20/24  8:49 AM   Specimen: BLOOD  Result Value Ref Range Status   Specimen Description   Final    BLOOD Performed at [facility], [address], [place], [place] [address]    Special Requests   Final    BOTTLES DRAWN AEROBIC AND ANAEROBIC Blood Culture results may not be optimal due to an inadequate volume of blood received in culture bottles Performed at [facility], [address] [place], [place] [address]    Culture   Final    NO GROWTH 5 DAYS Performed at [facility], [address], [place], [place] [address]    Report Status 04/25/2024 FINAL  Final  Culture, Respiratory w Gram Stain     Status: None   Collection Time: 04/20/24 12:03 PM   Specimen: Tracheal Aspirate; Respiratory  Result Value Ref Range Status   Specimen Description   Final    TRACHEAL ASPIRATE Performed at [facility], [address], [place], [place] [address]    Special Requests   Final    NONE Performed at [facility], [address], [place], [place] [address]    Gram Stain NO WBC SEEN RARE GRAM POSITIVE COCCI   Final   Culture   Final    RARE Normal respiratory flora-no Staph aureus or Pseudomonas seen Performed at [facility], [address] [place], [place] [address]    Report Status 04/23/2024 FINAL  Final  Aerobic/Anaerobic Culture w Gram Stain (surgical/deep wound)     Status: None   Collection Time: 04/21/24  2:23 PM   Specimen: Soft Tissue, Other  Result Value Ref Range Status   Specimen Description   Final    TISSUE INTRA ABDOMINAL NECROTIC TISSUE Performed at [facility], [address], [place], [place] [address]    Special Requests   Final    NONE Performed at [facility], [address], [place], [place] [address]    Gram Stain    Final    FEW WBC PRESENT,BOTH PMN AND MONONUCLEAR RARE GRAM POSITIVE COCCI IN PAIRS AND CHAINS RARE YEAST    Culture   Final    RARE ESCHERICHIA COLI FEW CANDIDA ALBICANS FEW ENTEROCOCCUS FAECALIS FEW BACTEROIDES SPECIES BETA LACTAMASE POSITIVE Performed at [facility], [address] [place], [place] [address]    Report Status 04/25/2024 FINAL  Final   Organism ID, Bacteria ESCHERICHIA COLI  Final   Organism ID, Bacteria ENTEROCOCCUS FAECALIS  Final      Susceptibility   Escherichia coli - MIC*    AMPICILLIN >=32 RESISTANT Resistant     CEFEPIME  <=0.12 SENSITIVE Sensitive     CEFTAZIDIME <=1 SENSITIVE Sensitive     CEFTRIAXONE  1 SENSITIVE Sensitive     CIPROFLOXACIN  <=0.25 SENSITIVE Sensitive     GENTAMICIN  <=1 SENSITIVE Sensitive     IMIPENEM <=0.25 SENSITIVE Sensitive     TRIMETH /SULFA  <=20 SENSITIVE Sensitive     AMPICILLIN/SULBACTAM >=32 RESISTANT Resistant     PIP/TAZO 8 SENSITIVE Sensitive ug/mL    * RARE ESCHERICHIA COLI   Enterococcus faecalis - MIC*    AMPICILLIN <=2 SENSITIVE Sensitive     VANCOMYCIN  1  SENSITIVE Sensitive     GENTAMICIN  SYNERGY SENSITIVE Sensitive     * FEW ENTEROCOCCUS FAECALIS    Labs: Results for orders placed or performed during the hospital encounter of 04/10/24 (from the past 48 hours)  Glucose, capillary     Status: Abnormal   Collection Time: 04/29/24 11:09 AM  Result Value Ref Range   Glucose-Capillary 110 (H) 70 - 99 mg/dL    Comment: Glucose reference range applies only to samples taken after fasting for at least 8 hours.  Glucose, capillary     Status: Abnormal   Collection Time: 04/29/24  3:29 PM  Result Value Ref Range   Glucose-Capillary 116 (H) 70 - 99 mg/dL    Comment: Glucose reference range applies only to samples taken after fasting for at least 8 hours.  Renal function panel (daily at 1600)     Status: Abnormal   Collection Time: 04/29/24  4:00 PM  Result Value Ref Range   Sodium 139 135 - 145 mmol/L    Potassium 4.5 3.5 - 5.1 mmol/L   Chloride 104 98 - 111 mmol/L   CO2 25 22 - 32 mmol/L   Glucose, Bld 120 (H) 70 - 99 mg/dL    Comment: Glucose reference range applies only to samples taken after fasting for at least 8 hours.   BUN 66 (H) 8 - 23 mg/dL   Creatinine, Ser 0.27 (H) 0.61 - 1.24 mg/dL   Calcium  8.1 (L) 8.9 - 10.3 mg/dL   Phosphorus 3.2 2.5 - 4.6 mg/dL   Albumin  2.1 (L) 3.5 - 5.0 g/dL   GFR, Estimated 36 (L) >60 mL/min    Comment: (NOTE) Calculated using the CKD-EPI Creatinine Equation (2021)    Anion gap 10 5 - 15    Comment: Performed at [facility], [address] [place], [place] [address]  Glucose, capillary     Status: Abnormal   Collection Time: 04/29/24  7:21 PM  Result Value Ref Range   Glucose-Capillary 139 (H) 70 - 99 mg/dL    Comment: Glucose reference range applies only to samples taken after fasting for at least 8 hours.  Glucose, capillary     Status: Abnormal   Collection Time: 04/29/24 11:18 PM  Result Value Ref Range   Glucose-Capillary 138 (H) 70 - 99 mg/dL    Comment: Glucose reference range applies only to samples taken after fasting for at least 8 hours.  Glucose, capillary     Status: Abnormal   Collection Time: 04/30/24  3:21 AM  Result Value Ref Range   Glucose-Capillary 173 (H) 70 - 99 mg/dL    Comment: Glucose reference range applies only to samples taken after fasting for at least 8 hours.  Heparin  level (unfractionated)     Status: None   Collection Time: 04/30/24  3:30 AM  Result Value Ref Range   Heparin  Unfractionated 0.56 0.30 - 0.70 IU/mL    Comment: (NOTE) The clinical reportable range upper limit is being lowered to >1.10 to align with the FDA approved guidance for the current laboratory assay.  If heparin  results are below expected values, and patient dosage has  been confirmed, suggest follow up testing of antithrombin III levels. Performed at [facility], [address] [place], [place] [address]   Renal  function panel (daily at 0500)     Status: Abnormal   Collection Time: 04/30/24  3:30 AM  Result Value Ref Range   Sodium 135 135 - 145 mmol/L  Potassium 4.7 3.5 - 5.1 mmol/L   Chloride 104 98 - 111 mmol/L   CO2 23 22 - 32 mmol/L   Glucose, Bld 171 (H) 70 - 99 mg/dL    Comment: Glucose reference range applies only to samples taken after fasting for at least 8 hours.   BUN 50 (H) 8 - 23 mg/dL   Creatinine, Ser 1.61 (H) 0.61 - 1.24 mg/dL   Calcium  7.8 (L) 8.9 - 10.3 mg/dL   Phosphorus 2.7 2.5 - 4.6 mg/dL   Albumin  2.0 (L) 3.5 - 5.0 g/dL   GFR, Estimated 43 (L) >60 mL/min    Comment: (NOTE) Calculated using the CKD-EPI Creatinine Equation (2021)    Anion gap 8 5 - 15    Comment: Performed at [facility], [address] [place], [place] [address]  Magnesium      Status: None   Collection Time: 04/30/24  3:30 AM  Result Value Ref Range   Magnesium  2.4 1.7 - 2.4 mg/dL    Comment: Performed at [facility], [address], [place], [place] [address]  CBC     Status: Abnormal   Collection Time: 04/30/24  3:30 AM  Result Value Ref Range   WBC 14.5 (H) 4.0 - 10.5 K/uL   RBC 2.90 (L) 4.22 - 5.81 MIL/uL   Hemoglobin 8.4 (L) 13.0 - 17.0 g/dL   HCT 11.9 (L) 14.7 - 82.9 %   MCV 91.4 80.0 - 100.0 fL   MCH 29.0 26.0 - 34.0 pg   MCHC 31.7 30.0 - 36.0 g/dL   RDW 56.2 (H) 13.0 - 86.5 %   Platelets 217 150 - 400 K/uL   nRBC 0.0 0.0 - 0.2 %    Comment: Performed at [facility], [address], [place], [place] [address]  Glucose, capillary     Status: Abnormal   Collection Time: 04/30/24  7:31 AM  Result Value Ref Range   Glucose-Capillary 179 (H) 70 - 99 mg/dL    Comment: Glucose reference range applies only to samples taken after fasting for at least 8 hours.  Glucose, capillary     Status: None   Collection Time: 04/30/24 11:21 AM  Result Value Ref Range   Glucose-Capillary 89 70 - 99 mg/dL    Comment: Glucose reference range applies only to samples taken after  fasting for at least 8 hours.  Glucose, capillary     Status: Abnormal   Collection Time: 04/30/24  3:46 PM  Result Value Ref Range   Glucose-Capillary 156 (H) 70 - 99 mg/dL    Comment: Glucose reference range applies only to samples taken after fasting for at least 8 hours.  Renal function panel (daily at 1600)     Status: Abnormal   Collection Time: 04/30/24  4:11 PM  Result Value Ref Range   Sodium 135 135 - 145 mmol/L   Potassium 4.9 3.5 - 5.1 mmol/L   Chloride 105 98 - 111 mmol/L   CO2 23 22 - 32 mmol/L   Glucose, Bld 145 (H) 70 - 99 mg/dL    Comment: Glucose reference range applies only to samples taken after fasting for at least 8 hours.   BUN 49 (H) 8 - 23 mg/dL   Creatinine, Ser 6.29 (H) 0.61 - 1.24 mg/dL   Calcium  7.9 (L) 8.9 - 10.3 mg/dL   Phosphorus 2.4 (L) 2.5 - 4.6 mg/dL   Albumin  2.1 (L) 3.5 - 5.0 g/dL   GFR, Estimated 46 (L) >60  mL/min    Comment: (NOTE) Calculated using the CKD-EPI Creatinine Equation (2021)    Anion gap 7 5 - 15    Comment: Performed at [facility], [address] [place], [place] [address]  Glucose, capillary     Status: Abnormal   Collection Time: 04/30/24  7:59 PM  Result Value Ref Range   Glucose-Capillary 137 (H) 70 - 99 mg/dL    Comment: Glucose reference range applies only to samples taken after fasting for at least 8 hours.  Glucose, capillary     Status: Abnormal   Collection Time: 04/30/24 11:55 PM  Result Value Ref Range   Glucose-Capillary 132 (H) 70 - 99 mg/dL    Comment: Glucose reference range applies only to samples taken after fasting for at least 8 hours.  Glucose, capillary     Status: Abnormal   Collection Time: 05/01/24  4:06 AM  Result Value Ref Range   Glucose-Capillary 157 (H) 70 - 99 mg/dL    Comment: Glucose reference range applies only to samples taken after fasting for at least 8 hours.  Heparin  level (unfractionated)     Status: None   Collection Time: 05/01/24  4:17 AM  Result Value Ref Range    Heparin  Unfractionated 0.44 0.30 - 0.70 IU/mL    Comment: (NOTE) The clinical reportable range upper limit is being lowered to >1.10 to align with the FDA approved guidance for the current laboratory assay.  If heparin  results are below expected values, and patient dosage has  been confirmed, suggest follow up testing of antithrombin III levels. Performed at [facility], [address], [place], [place] [address]   Renal function panel (daily at 0500)     Status: Abnormal   Collection Time: 05/01/24  4:17 AM  Result Value Ref Range   Sodium 135 135 - 145 mmol/L   Potassium 4.9 3.5 - 5.1 mmol/L   Chloride 105 98 - 111 mmol/L   CO2 25 22 - 32 mmol/L   Glucose, Bld 159 (H) 70 - 99 mg/dL    Comment: Glucose reference range applies only to samples taken after fasting for at least 8 hours.   BUN 38 (H) 8 - 23 mg/dL   Creatinine, Ser 5.28 (H) 0.61 - 1.24 mg/dL   Calcium  7.9 (L) 8.9 - 10.3 mg/dL   Phosphorus 2.2 (L) 2.5 - 4.6 mg/dL   Albumin  2.0 (L) 3.5 - 5.0 g/dL   GFR, Estimated 46 (L) >60 mL/min    Comment: (NOTE) Calculated using the CKD-EPI Creatinine Equation (2021)    Anion gap 5 5 - 15    Comment: Performed at [facility], [address], [place], [place] [address]  Magnesium      Status: None   Collection Time: 05/01/24  4:17 AM  Result Value Ref Range   Magnesium  2.4 1.7 - 2.4 mg/dL    Comment: Performed at [facility], [address] [place], [place] [address]  CBC     Status: Abnormal   Collection Time: 05/01/24  4:17 AM  Result Value Ref Range   WBC 13.6 (H) 4.0 - 10.5 K/uL   RBC 2.77 (L) 4.22 - 5.81 MIL/uL   Hemoglobin 7.9 (L) 13.0 - 17.0 g/dL   HCT 72.5 (L) 36.6 - 44.0 %   MCV 90.6 80.0 - 100.0 fL   MCH 28.5 26.0 - 34.0 pg   MCHC 31.5 30.0 - 36.0 g/dL   RDW 34.7 (H) 42.5 - 95.6 %  Platelets 135 (L) 150 - 400 K/uL   nRBC 0.0 0.0 - 0.2 %    Comment: Performed at [facility], [address] [place], [place] [address]  Glucose,  capillary     Status: Abnormal   Collection Time: 05/01/24  7:52 AM  Result Value Ref Range   Glucose-Capillary 146 (H) 70 - 99 mg/dL    Comment: Glucose reference range applies only to samples taken after fasting for at least 8 hours.    Imaging / Studies: No results found.     Medications / Allergies: per chart  Antibiotics: Anti-infectives (From admission, onward)    Start     Dose/Rate Route Frequency Ordered Stop   04/29/24 1000  micafungin  (MYCAMINE ) 150 mg in sodium chloride  0.9 % 100 mL IVPB        150 mg 107.5 mL/hr over 1 Hours Intravenous Every 24 hours 04/28/24 1036     04/28/24 1345  micafungin  (MYCAMINE ) 100 mg in sodium chloride  0.9 % 100 mL IVPB        100 mg 105 mL/hr over 1 Hours Intravenous  Once 04/28/24 1245 04/28/24 1354   04/28/24 1000  micafungin  (MYCAMINE ) 100 mg in sodium chloride  0.9 % 100 mL IVPB  Status:  Discontinued        100 mg 105 mL/hr over 1 Hours Intravenous Every 24 hours 04/27/24 1219 04/28/24 1036   04/21/24 1400  clindamycin  (CLEOCIN ) 900 mg, gentamicin  (GARAMYCIN ) 240 mg in sodium chloride  0.9 % 1,000 mL for intraperitoneal lavage  Status:  Discontinued         Irrigation To Surgery 04/21/24 1346 04/21/24 1618   04/20/24 1400  piperacillin -tazobactam (ZOSYN ) IVPB 3.375 g        3.375 g 12.5 mL/hr over 240 Minutes Intravenous Every 8 hours 04/20/24 0755     04/20/24 1000  metroNIDAZOLE  (FLAGYL ) IVPB 500 mg  Status:  Discontinued        500 mg 100 mL/hr over 60 Minutes Intravenous Every 12 hours 04/20/24 0320 04/20/24 0752   04/20/24 1000  micafungin  (MYCAMINE ) 150 mg in sodium chloride  0.9 % 100 mL IVPB  Status:  Discontinued        150 mg 107.5 mL/hr over 1 Hours Intravenous Every 24 hours 04/20/24 0752 04/27/24 1219   04/20/24 0245  ceFEPIme  (MAXIPIME ) 2 g in sodium chloride  0.9 % 100 mL IVPB  Status:  Discontinued        2 g 200 mL/hr over 30 Minutes Intravenous Every 8 hours 04/20/24 0232 04/20/24 0752   04/20/24 0100   metroNIDAZOLE  (FLAGYL ) IVPB 500 mg        500 mg 100 mL/hr over 60 Minutes Intravenous On call to O.R. 04/20/24 0004 04/20/24 0100   04/14/24 1800  ceFEPIme  (MAXIPIME ) 2 g in sodium chloride  0.9 % 100 mL IVPB        2 g 200 mL/hr over 30 Minutes Intravenous Every 8 hours 04/14/24 1003 04/19/24 0957   04/14/24 1600  vancomycin  (VANCOCIN ) IVPB 1000 mg/200 mL premix  Status:  Discontinued        1,000 mg 200 mL/hr over 60 Minutes Intravenous Every 24 hours 04/13/24 1420 04/14/24 1000   04/14/24 1600  vancomycin  (VANCOREADY) IVPB 1500 mg/300 mL  Status:  Discontinued        1,500 mg 150 mL/hr over 120 Minutes Intravenous Every 24 hours 04/14/24 1002 04/16/24 0744   04/14/24 1200  vancomycin  (VANCOCIN ) IVPB 1000 mg/200 mL premix  Status:  Discontinued  1,000 mg 200 mL/hr over 60 Minutes Intravenous Every 24 hours 04/13/24 1053 04/13/24 1109   04/13/24 2200  ceFEPIme  (MAXIPIME ) 2 g in sodium chloride  0.9 % 100 mL IVPB  Status:  Discontinued        2 g 200 mL/hr over 30 Minutes Intravenous Every 12 hours 04/13/24 1036 04/13/24 1109   04/13/24 2200  ceFEPIme  (MAXIPIME ) 2 g in sodium chloride  0.9 % 100 mL IVPB  Status:  Discontinued        2 g 200 mL/hr over 30 Minutes Intravenous Every 12 hours 04/13/24 1425 04/14/24 1003   04/13/24 1500  vancomycin  (VANCOCIN ) 2,000 mg in sodium chloride  0.9 % 500 mL IVPB        2,000 mg 260 mL/hr over 120 Minutes Intravenous  Once 04/13/24 1409 04/13/24 1850   04/13/24 1500  ceFEPIme  (MAXIPIME ) 2 g in sodium chloride  0.9 % 100 mL IVPB  Status:  Discontinued        2 g 200 mL/hr over 30 Minutes Intravenous Every 12 hours 04/13/24 1420 04/13/24 1425   04/13/24 1500  metroNIDAZOLE  (FLAGYL ) IVPB 500 mg  Status:  Discontinued        500 mg 100 mL/hr over 60 Minutes Intravenous Every 12 hours 04/13/24 1420 04/17/24 0725   04/13/24 1200  vancomycin  (VANCOCIN ) 2,000 mg in sodium chloride  0.9 % 500 mL IVPB  Status:  Discontinued        2,000 mg 260 mL/hr over  120 Minutes Intravenous  Once 04/13/24 1052 04/13/24 1121   04/13/24 1100  metroNIDAZOLE  (FLAGYL ) IVPB 500 mg  Status:  Discontinued        500 mg 100 mL/hr over 60 Minutes Intravenous 2 times daily 04/13/24 1007 04/13/24 1109   04/13/24 1100  vancomycin  (VANCOCIN ) IVPB 1000 mg/200 mL premix  Status:  Discontinued        1,000 mg 200 mL/hr over 60 Minutes Intravenous  Once 04/13/24 1007 04/13/24 1020   04/13/24 1015  ceFEPIme  (MAXIPIME ) 2 g in sodium chloride  0.9 % 100 mL IVPB        2 g 200 mL/hr over 30 Minutes Intravenous STAT 04/13/24 1007 04/14/24 1721   04/10/24 2200  ceFAZolin  (ANCEF ) IVPB 2g/100 mL premix        2 g 200 mL/hr over 30 Minutes Intravenous Every 8 hours 04/10/24 1833 04/11/24 0531   04/10/24 0600  ceFAZolin  (ANCEF ) IVPB 2g/100 mL premix        2 g 200 mL/hr over 30 Minutes Intravenous On call to O.R. 04/10/24 0533 04/10/24 1526         Note: Portions of this report may have been transcribed using voice recognition software. Every effort was made to ensure accuracy; however, inadvertent computerized transcription errors may be present.   Any transcriptional errors that result from this process are unintentional.    Eddye Goodie, MD, FACS, MASCRS Esophageal, Gastrointestinal & Colorectal Surgery Robotic and Minimally Invasive Surgery  [facility] [address], [address] [place], [place] [address] [phone] Fax [phone] Main  CONTACT INFORMATION: Weekday (9AM-5PM): Call [facility] main office at [phone] Weeknight (5PM-9AM) or Weekend/Holiday: Check EPIC Web Links tab & use AMION (password  [email or web address]) for General Surgery [facility] coverage  Please, DO NOT use SecureChat  (it is not reliable communication to reach operating surgeons & will lead to a delay in care).   Epic staff messaging available for outptient concerns needing 1-2 business day response.  05/01/2024  8:05 AM

## 2024-05-01 NOTE — Progress Notes (Signed)
 Physical Therapy Treatment Patient Details Name: Kristopher Thompson MRN: [id number] DOB: August 08, 1951 Today's Date: 05/01/2024   History of Present Illness Pt is a 73 y/o M s/p hernia repair and urostomy revision on 5/30. Returned to OR 6/9 for perforated bowel, post op shock, s/p exploratory lapartomy with drainage of R abdominal wall abscess on 6/9. CRRT 6/11-6/14. Extubated 6/14, Transferred to [facility] 6/15 for intermittent HD. CRRT restarted 6/17-6/20 removed. PMH includes CHF, colostomy, DM, GERD, abdominal surgery.    PT Comments  Pt received in supine, spouse present and encouraging, pt agreeable to therapy session and with slightly improved tolerance for upright bed chair posture and UE/LE AAROM/PROM and trials of long sitting from bed chair posture. Pt with weak grip strength and global edema, more significant in RUE, pt positioned with RUE elevated for edema mgmt and positioned for improved pulmonary clearance. TED hose donned throughout. SpO2 and HR WFL on RA with repositioning and exercises. Pt somewhat tearful during activity and discussion of PLOF. Slow processing, especially with BLE motor commands, unclear if due to AMS or weakness. Patient will benefit from continued inpatient follow up therapy, <3 hours/day.    If plan is discharge home, recommend the following: Assistance with cooking/housework;Assist for transportation;Help with stairs or ramp for entrance;Two people to help with walking and/or transfers;Two people to help with bathing/dressing/bathroom;Supervision due to cognitive status   Can travel by private vehicle     No  Equipment Recommendations  Other (comment) (TBD, currently at hospital bed/hoyer level)    Recommendations for Other Services       Precautions / Restrictions Precautions Precautions: Fall Recall of Precautions/Restrictions: Impaired Precaution/Restrictions Comments: bil trunk JP drains, RLQ urostomy, cortrak, midline abd wound vac Restrictions Weight  Bearing Restrictions Per Provider Order: No     Mobility  Bed Mobility Overal bed mobility: Needs Assistance Bed Mobility: Supine to Sit     Supine to sit: Max assist, Total assist, +2 for physical assistance, Used rails, HOB elevated     General bed mobility comments: tolerates bed mobility, deferred EOB/OOB at this time due to impaired command follow and profound BUE/BLE weakness, pt able to place BUE on side rails but not able to grip rails well to pull his trunk forward. used BUE support behind pt elbow and bed pad assist to perform long sitting from bed chair posture x3 trials, pt needing max/totalA to reach upright and maintain ~5-20 seconds at a time.    Transfers Overall transfer level: Needs assistance                 General transfer comment: deferred; weakness/fatigue    Ambulation/Gait                   Stairs             Wheelchair Mobility     Tilt Bed    Modified Rankin (Stroke Patients Only)       Balance Overall balance assessment: Needs assistance Sitting-balance support: Bilateral upper extremity supported, Feet unsupported Sitting balance-Leahy Scale: Zero Sitting balance - Comments: long sitting in ICU bed                                    Communication Communication Communication: Impaired Factors Affecting Communication: Reduced clarity of speech;Difficulty expressing self  Cognition Arousal: Alert Behavior During Therapy: Flat affect, Lability   PT - Cognitive impairments: Difficult to assess, Attention,  Initiation, Sequencing, Problem solving                       PT - Cognition Comments: Pt with slow following of motor commands at times, decreased initiation of BLE, pt at times making jokes and 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 At times difficult to tell if he is laughing or crying. Following commands: Impaired Following commands impaired: Follows one step commands  with increased time, Only follows one step commands consistently    Cueing Cueing Techniques: Verbal cues, Gestural cues, Tactile cues  Exercises General Exercises - Lower Extremity Ankle Circles/Pumps: Both, AROM, 10 reps, Supine, AAROM Long Arc Quad: Both, PROM, AAROM, 10 reps, Supine (ICU bed chair posture) Hip ABduction/ADduction: AAROM, Both, 5 reps, Other (comment) (ICU bed chair posture) Hip Flexion/Marching: PROM, AAROM, Both, 10 reps, Other (comment) (ICU bed chair posture) Other Exercises Other Exercises: BUE AAROM: bicycle motion with UE x10 reps for shoulder and elbow flexion/extension Other Exercises: gross grasp and finger extension x5 reps ea A/AAROM and LUE finger opposition x2 reps ea for teachback Other Exercises: bed crunches x3 reps with BUE support for core activation    General Comments General comments (skin integrity, edema, etc.): drains appear c/d/i, BP WFL with bed placed in chair posture initially and when rechecked ~15 mins later. RN notified pt c/o mild nausea and requesting some suctioning of his nose due to feeling blocked up around cortrak tube.      Pertinent Vitals/Pain Pain Assessment Pain Assessment: PAINAD Breathing: normal Negative Vocalization: occasional moan/groan, low speech, negative/disapproving quality Facial Expression: sad, frightened, frown Body Language: relaxed Consolability: distracted or reassured by voice/touch PAINAD Score: 3 Pain Location: likely abdominal pain with repositioning Pain Descriptors / Indicators: Grimacing, Discomfort Pain Intervention(s): Limited activity within patient's tolerance, Monitored during session, Repositioned    Home Living                          Prior Function            PT Goals (current goals can now be found in the care plan section) Acute Rehab PT Goals Patient Stated Goal: Regain independence PT Goal Formulation: With patient Time For Goal Achievement: 05/13/24 Progress  towards PT goals: Progressing toward goals (slowly)    Frequency    Min 3X/week      PT Plan      Co-evaluation              AM-PAC PT 6 Clicks Mobility   Outcome Measure  Help needed turning from your back to your side while in a flat bed without using bedrails?: Total Help needed moving from lying on your back to sitting on the side of a flat bed without using bedrails?: Total Help needed moving to and from a bed to a chair (including a wheelchair)?: Total Help needed standing up from a chair using your arms (e.g., wheelchair or bedside chair)?: Total Help needed to walk in hospital room?: Total Help needed climbing 3-5 steps with a railing? : Total 6 Click Score: 6    End of Session   Activity Tolerance: Patient limited by fatigue Patient left: in bed;with call bell/phone within reach;with family/visitor present;Other (comment) (bed in chair posture, pillows for BUE support and edema mgmt) Nurse Communication: Mobility status;Other (comment) (pt requesting his nose to be suctioned and for anti-nausea meds) PT Visit Diagnosis: Unsteadiness on feet (R26.81);Other abnormalities of gait  and mobility (R26.89);Muscle weakness (generalized) (M62.81);Difficulty in walking, not elsewhere classified (R26.2);Adult, failure to thrive (R62.7)     Time: 4782-9562 PT Time Calculation (min) (ACUTE ONLY): 34 min  Charges:    $Therapeutic Exercise: 8-22 mins $Therapeutic Activity: 8-22 mins PT General Charges $$ ACUTE PT VISIT: 1 Visit                     Kristopher Daudelin P., PTA Acute Rehabilitation Services Secure Chat Preferred 9a-5:30pm Office: [phone]    Arville Laughter 05/01/2024, 6:20 PM

## 2024-05-01 NOTE — Progress Notes (Signed)
 05/01/2024  Kristopher Thompson [id number] [id number]  CARE TEAM: PCP: Dudley Ghee, MD  Outpatient Care Team: Patient Care Team: Dudley Ghee, MD as PCP - General (Internal Medicine) Devon Fogo, MD (Inactive) as Consulting Physician (Dermatology) Lavina Pou, MD as Referring Physician (Pulmonary Disease) Candyce Champagne, MD as Consulting Physician (General Surgery) Florencio Hunting, MD as Consulting Physician (Urology)  Inpatient Treatment Team: Treatment Team:  Candyce Champagne, MD Etter Hermann., MD Florencio Hunting, MD Pccm, Md, MD Clementine Cutting, MD Catheline Clos, RN Baron Border, MD Terre Ferri, MD Alexander Iba, RN Idolina Maker, RN Carlyle Childes, RN Arville Laughter, PTA   Problem List:   Principal Problem:   Delayed bowel perforation s/p SBR/end ileostomy Active Problems:   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 08/08/2017   S/P ileal conduit (HCC)   GERD (gastroesophageal reflux disease)   Obesity (BMI 35.0-39.9 without comorbidity)   Prolonged QT interval   Non-recurrent bilateral inguinal hernia without obstruction or gangrene   Incarcerated incisional hernia   Sinus tachycardia   Tachypnea   Acute respiratory insufficiency, postoperative   Sepsis due to undetermined organism (HCC)   Lactic acidosis   Class 2 obesity   Chronic anticoagulation   Hearing loss   History of bladder cancer   Obstructive sleep apnea of adult   Parastomal hernia of ileal conduit   Partial small bowel obstruction (HCC)   Personal history of PE (pulmonary embolism)   Pressure injury of skin   04/10/2024  POST-OPERATIVE DIAGNOSIS:  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   PROCEDURE:   -ROBOTIC LYSIS OF ADHESIONS X 4 HOURS -COMPONENT SEPARATION - TRANSVERSUS ABDOMINIS REALEASE (TAR) BILATERAL -ROBOTIC REPAIRS OF  INCISIONAL, PARASTOMAL, LEFT INGUINAL  INCARCERATED ABDOMINAL WALL HERNIAS WITH  MESH -UROSTOMY ILEAL CONDUIT REVISION -INTRAOPERATIVE ASSESSMENT OF TISSUE VASCULAR PERFUSION USING ICG (indocyanine green ) -IMMUNOFLUORESCENCE -TRANSVERSUS ABDOMINIS PLANE (TAP) BLOCK - BILATERAL    SURGEON:  Eddye Goodie, MD  OR FINDINGS:  Patient had dense adhesions anterior abdominal wall.  Largest hernia was in the right lower quadrant around the ileal conduit urostomy.  16 x 12 cm region.  Incarcerated with over a foot of small bowel.  Midline next largest 9 x 8 cm incarcerated with numerous loops of small bowel.  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  Another direct space hernia in the left lower quadrant and indirect hernia on the left lower quadrant.  No obvious hernias on the right side.   Component separation's done on both sides to try and get the largest midline and parastomal hernias close down with running serrated try to fix absorbable suture.  Broad underlay repair with 30 x 35 cm mesh transverse  04/20/2024 POST-OPERATIVE DIAGNOSIS:  perforated bowel   PROCEDURE:  Drainage of right abdominal wall abscess as well as intra-abdominal abscess Explantation of abdominal mesh Small bowel resection (in discontinuity) Placement of ABThera wound VAC   SURGEON:  Aldean Hummingbird, MD  ASSISTANTS: Adalberto Acton, MD   04/21/2024  POST-OPERATIVE DIAGNOSIS:  DELAYED BOWEL PERFORATION WITH OPEN ABDOMEN   PROCEDURE:   LYSIS OF ADHESIONS X ABDOMINAL WALL DEBRIDEMENT ILEAL RESECTION END ILEOSTOMY ABDOMINAL WALL PARASTOMAL & INCISIONAL HERNIA REPAIRS WITH PHASIX MESH FASCIA CLOSURE WITH WOUND VAC PLACEMENT IN SQ   SURGEON:  Eddye Goodie, MD   ASSISTANT:  [name], MD   Ostomy:  Right upper quadrant: End ileostomy Right lower quadrant: Urostomy ileal conduit  Drains: 19 Fr Blake drains x2   Right lower quadrant drain rests in the right panniculus abdominal wall between the Phasix mesh and abdominal wall   Left lower quadrant drain  rest in the anterior peritoneum between the mesh and the bowel with the tip resting in the right lower quadrant near the base of the ileal conduit and prior bowel resection  Assessment [facility] Stay = 21 days) 10 Days Post-Op    Stabilizing but still very challenged  Plan:  Shock resolved.  Nutrition: Bowel function returned.  Cycling tube feeds.  Trying 165mL/hour x 14 hours during evening/night.  Fiber to thicken ileostomy effluent -soluble polyfiber/FiberCon  Add loperamide to avoid too much high output ileostomy.  Weaned off TPN 6/16   PO as tolerated.  Patient more alert and talking today.  Will see if speech therapy can retry evaluation and get on a dysphagia 1 diet.  Hold off on G-tube unless poor PO effort >14days  Infection due to delayed bowel perforation and abdominal wall infection/necrosis  Growing multiple organisms including Candida albicans, Enterobacter, and Enterococcus.    IV Zosyn /micafungin .  Anticipate prolonged antibiotics given the abdominal wall infection/necrosis and need for Phasix mesh.    I have asked infectious disease (Dr Lynell Sar)  to help evaluate.  I asked them again to see  Discussed with Dr. Fulton Job with critical care and we both feel micafungin  more appropriate treatment for right now until C albicans sensitivities can be done.  Cardiac/rhythm issues with fluconazole .    Change wound vac q MonWedFri the hopes for the subcutaneous tissues to recover.  Last change 6/18 showing early granulation without necrosis guardedly reassuring.     AKI in the setting of urostomy ileal conduit and shock.    Initially oliguric and now higher volume.    Intraperitoneal surgical Blake fluid creatinine 6/12 same as serum argues against leak.  Dr Rozanne Corners aware  CRRTx vs intermittent HD to help clear uremia/wastes per nephrology.  Most likely will transition to intermittent since uremia better and mental status better.  Metabolic encephalopathy with all  the stresses he has been through, especially Uremia.    CRRTx to help -markedly improved.  Follow closely   CT of chest negative for any obvious pulmonary embolism or pneumonia.  Oxygen needs less.  Echocardiogram shows grade 1 diastolic dysfunction which I believe is his baseline.  Defer to critical care/internal medicine they feel further is needed aside for some monitor diuresis.  ABLA on top of anemia of chronic disease improved with transfusion.  Follow.    -monitor electrolytes & replace as needed.  Keep K>4, Mg>2, Phos>3.    -Diabetes.  Sliding scale insulin .    -VTE prophylaxis-  Full anticoagulation heparin  drip given history of pulmonary embolism,.  Could consider back on DOAC enteral route once off CRRTx  -Mobilize as tolerated.  Agree with critical care about having physical and Occupational Therapy get more involved now that he is more alert.  Hopefully can rebuild strength and minimize further atrophy.  Tentatively saying FL 2 discussion about rehab.  I do not want this patient going to an LTAC.  He is not ready.  I updated the patient's status to the the patient & ICU RN in room.  Recommendations were made.  Questions were answered.  They expressed understanding & appreciation.  -Disposition: He is going to be here a while       I reviewed nursing notes, last 24 h vitals and pain scores, last 48 h  intake and output, last 24 h labs and trends, and last 24 h imaging results.  I have reviewed this patient's available data, including medical history, events of note, test results, etc as part of my evaluation.   A significant portion of that time was spent in counseling. Care during the described time interval was provided by me.  This care required high  level of medical decision making.  05/01/2024    Subjective: (Chief complaint)  On CRRTx More alert & following commands.  Answering questions Denies pain ICU nurse in room Wife at bedside  Objective:  Vital  signs:  Vitals:   05/01/24 0515 05/01/24 0530 05/01/24 0545 05/01/24 0600  BP:      Pulse: 96 94 95 96  Resp: 17 (!) 21 (!) 22 18  Temp:      TempSrc:      SpO2: 100% 100% 100% 100%  Weight:      Height:        Last BM Date : 04/30/24  Intake/Output   Yesterday:  06/19 0701 - 06/20 0700 In: 2630 [P.O.:210; I.V.:436.4; NG/GT:1746.7; IV Piggyback:236.9] Out: 3014.6 [Urine:288; Drains:429; Stool:1085] This shift:  Total I/O In: 1483.8 [P.O.:30; I.V.:209; NG/GT:1195; IV Piggyback:49.9] Out: 1492 [Urine:178; Drains:164; Stool:440]  Bowel function:  Flatus: YES  BM:  YES -small  Drains:  RLQ drain (rests between abdominal wall and Phasix mesh): Serous    LLQ drain (runs over bowel with tip down in RLQ pelvis near base of ileal conduit and site of bowel resection):  thinly serosanguinous   Wound vac (in SQ over closed fascia):  serosanguineous   Physical Exam:  General: Awake but not providing much eye contact.  Follows a few commands.  Answering simple questions.  Not agitated Eyes: PERRL, normal EOM.  Sclera clear.  No icterus Neuro: CN II-XII intact w/o focal sensory/motor deficits. Lymph: No head/neck/groin lymphadenopathy Psych:  No delerium/psychosis/paranoia.  No agitation HENT: Normocephalic, Mucus membranes moist.  No thrush.  ETT & NGT in place Neck: Supple, No tracheal deviation.  No obvious thyromegaly Chest: No pain to chest wall compression.  Good respiratory excursion.   CV:  Pulses intact.  Regular rhythm.  1-2+ BUE/BLE edema MS:  No obvious deformity  Abdomen:  Obese but Soft.  Nondistended.  Wound vac in midline.    Flank ecchymosis bilateral posterior resolving.  Abdominal edema mostly resolved   RUQ: (End ileostomy): Tan oatmeal thick effluent in bag RLQ:  (Urostomy ileal conduit): pink with clear light yellow-colored urine  Ext:   No deformity.  Bilateral hand 1+ edema greater than lower extremity   legs without any edema.  No cyanosis Skin: No  petechiae / purpurea.  No major sores.  Warm and dry    Results:   Cultures: Recent Results (from the past 720 hours)  Culture, blood (Routine X 2) w Reflex to ID Panel     Status: None   Collection Time: 04/13/24  7:13 AM   Specimen: BLOOD  Result Value Ref Range Status   Specimen Description   Final    BLOOD BLOOD LEFT ARM AEROBIC BOTTLE ONLY ANAEROBIC BOTTLE ONLY Performed at [facility], [address] [place], [place] [address]    Special Requests   Final    BOTTLES DRAWN AEROBIC AND ANAEROBIC Blood Culture results may not be optimal due to an inadequate volume of blood received in culture bottles Performed at [facility], [address] [place], [place] [address]  Culture   Final    NO GROWTH 5 DAYS Performed at [facility], [address], [place], [place] [address]    Report Status 04/18/2024 FINAL  Final  Culture, blood (Routine X 2) w Reflex to ID Panel     Status: None   Collection Time: 04/13/24  7:20 AM   Specimen: BLOOD  Result Value Ref Range Status   Specimen Description   Final    BLOOD BLOOD RIGHT ARM AEROBIC BOTTLE ONLY ANAEROBIC BOTTLE ONLY Performed at [facility], [address], [place], [place] [address]    Special Requests   Final    BOTTLES DRAWN AEROBIC AND ANAEROBIC Blood Culture results may not be optimal due to an inadequate volume of blood received in culture bottles Performed at [facility] [place] [facility], [address], [place], [place] [address]    Culture   Final    NO GROWTH 5 DAYS Performed at [facility], [address], [place], [place] [address]    Report Status 04/18/2024 FINAL  Final  Urine Culture     Status: None   Collection Time: 04/13/24 10:18 AM   Specimen: Urine, Clean Catch  Result Value Ref Range Status   Specimen Description   Final    URINE, CLEAN CATCH Performed at [facility], [address], [place], [place]  [address]    Special Requests   Final    NONE Performed at [facility], [address] [place], [place] [address]    Culture   Final    NO GROWTH Performed at [facility], [address], [place], [place] [address]    Report Status 04/14/2024 FINAL  Final  MRSA Next Gen by PCR, Nasal     Status: None   Collection Time: 04/13/24 11:35 AM   Specimen: Nasal Mucosa; Nasal Swab  Result Value Ref Range Status   MRSA by PCR Next Gen NOT DETECTED NOT DETECTED Final    Comment: (NOTE) The GeneXpert MRSA Assay (FDA approved for NASAL specimens only), is one component of a comprehensive MRSA colonization surveillance program. It is not intended to diagnose MRSA infection nor to guide or monitor treatment for MRSA infections. Test performance is not FDA approved in patients less than 66 years old. Performed at [facility], [address], [place], [place] [address]   Culture, blood (Routine X 2) w Reflex to ID Panel     Status: None   Collection Time: 04/20/24  8:46 AM   Specimen: BLOOD RIGHT ARM  Result Value Ref Range Status   Specimen Description   Final    BLOOD RIGHT ARM Performed at [facility], [address] [place], [place] [address]    Special Requests   Final    BOTTLES DRAWN AEROBIC AND ANAEROBIC Blood Culture results may not be optimal due to an inadequate volume of blood received in culture bottles Performed at [facility], [address], [place], [place] [address]    Culture   Final    NO GROWTH 5 DAYS Performed at [facility], [address], [place], [place] [address]    Report Status 04/25/2024 FINAL  Final  Culture, blood (Routine X 2) w Reflex to ID Panel     Status: None   Collection Time: 04/20/24  8:49 AM   Specimen: BLOOD  Result Value Ref Range Status   Specimen Description   Final  BLOOD Performed at [facility], [address] [place], [place] [address]    Special  Requests   Final    BOTTLES DRAWN AEROBIC AND ANAEROBIC Blood Culture results may not be optimal due to an inadequate volume of blood received in culture bottles Performed at [facility], [address], [place], [place] [address]    Culture   Final    NO GROWTH 5 DAYS Performed at [facility], [address] [place], [place] [address]    Report Status 04/25/2024 FINAL  Final  Culture, Respiratory w Gram Stain     Status: None   Collection Time: 04/20/24 12:03 PM   Specimen: Tracheal Aspirate; Respiratory  Result Value Ref Range Status   Specimen Description   Final    TRACHEAL ASPIRATE Performed at [facility], [address], [place], [place] [address]    Special Requests   Final    NONE Performed at [facility], [address], [place], [place] [address]    Gram Stain NO WBC SEEN RARE GRAM POSITIVE COCCI   Final   Culture   Final    RARE Normal respiratory flora-no Staph aureus or Pseudomonas seen Performed at [facility], [address], [place], [place] [address]    Report Status 04/23/2024 FINAL  Final  Aerobic/Anaerobic Culture w Gram Stain (surgical/deep wound)     Status: None   Collection Time: 04/21/24  2:23 PM   Specimen: Soft Tissue, Other  Result Value Ref Range Status   Specimen Description   Final    TISSUE INTRA ABDOMINAL NECROTIC TISSUE Performed at [facility], [address] [place], [place] [address]    Special Requests   Final    NONE Performed at [facility], [address] [place] [address] [place], [place] [address]    Gram Stain   Final    FEW WBC PRESENT,BOTH PMN AND MONONUCLEAR RARE GRAM POSITIVE COCCI IN PAIRS AND CHAINS RARE YEAST    Culture   Final    RARE ESCHERICHIA COLI FEW CANDIDA ALBICANS FEW ENTEROCOCCUS FAECALIS FEW BACTEROIDES SPECIES BETA LACTAMASE POSITIVE Performed at [facility], [address], [place], [place]  [address]    Report Status 04/25/2024 FINAL  Final   Organism ID, Bacteria ESCHERICHIA COLI  Final   Organism ID, Bacteria ENTEROCOCCUS FAECALIS  Final      Susceptibility   Escherichia coli - MIC*    AMPICILLIN >=32 RESISTANT Resistant     CEFEPIME  <=0.12 SENSITIVE Sensitive     CEFTAZIDIME <=1 SENSITIVE Sensitive     CEFTRIAXONE  1 SENSITIVE Sensitive     CIPROFLOXACIN  <=0.25 SENSITIVE Sensitive     GENTAMICIN  <=1 SENSITIVE Sensitive     IMIPENEM <=0.25 SENSITIVE Sensitive     TRIMETH /SULFA  <=20 SENSITIVE Sensitive     AMPICILLIN/SULBACTAM >=32 RESISTANT Resistant     PIP/TAZO 8 SENSITIVE Sensitive ug/mL    * RARE ESCHERICHIA COLI   Enterococcus faecalis - MIC*    AMPICILLIN <=2 SENSITIVE Sensitive     VANCOMYCIN  1 SENSITIVE Sensitive     GENTAMICIN  SYNERGY SENSITIVE Sensitive     * FEW ENTEROCOCCUS FAECALIS    Labs: Results for orders placed or performed during the hospital encounter of 04/10/24 (from the past 48 hours)  Glucose, capillary     Status: Abnormal   Collection Time: 04/29/24  7:49 AM  Result Value Ref Range   Glucose-Capillary 170 (H) 70 -  99 mg/dL    Comment: Glucose reference range applies only to samples taken after fasting for at least 8 hours.  Glucose, capillary     Status: Abnormal   Collection Time: 04/29/24 11:09 AM  Result Value Ref Range   Glucose-Capillary 110 (H) 70 - 99 mg/dL    Comment: Glucose reference range applies only to samples taken after fasting for at least 8 hours.  Glucose, capillary     Status: Abnormal   Collection Time: 04/29/24  3:29 PM  Result Value Ref Range   Glucose-Capillary 116 (H) 70 - 99 mg/dL    Comment: Glucose reference range applies only to samples taken after fasting for at least 8 hours.  Renal function panel (daily at 1600)     Status: Abnormal   Collection Time: 04/29/24  4:00 PM  Result Value Ref Range   Sodium 139 135 - 145 mmol/L   Potassium 4.5 3.5 - 5.1 mmol/L   Chloride 104 98 - 111 mmol/L   CO2 25 22 - 32  mmol/L   Glucose, Bld 120 (H) 70 - 99 mg/dL    Comment: Glucose reference range applies only to samples taken after fasting for at least 8 hours.   BUN 66 (H) 8 - 23 mg/dL   Creatinine, Ser 6.04 (H) 0.61 - 1.24 mg/dL   Calcium  8.1 (L) 8.9 - 10.3 mg/dL   Phosphorus 3.2 2.5 - 4.6 mg/dL   Albumin  2.1 (L) 3.5 - 5.0 g/dL   GFR, Estimated 36 (L) >60 mL/min    Comment: (NOTE) Calculated using the CKD-EPI Creatinine Equation (2021)    Anion gap 10 5 - 15    Comment: Performed at [facility], [address] [place], [place] [address]  Glucose, capillary     Status: Abnormal   Collection Time: 04/29/24  7:21 PM  Result Value Ref Range   Glucose-Capillary 139 (H) 70 - 99 mg/dL    Comment: Glucose reference range applies only to samples taken after fasting for at least 8 hours.  Glucose, capillary     Status: Abnormal   Collection Time: 04/29/24 11:18 PM  Result Value Ref Range   Glucose-Capillary 138 (H) 70 - 99 mg/dL    Comment: Glucose reference range applies only to samples taken after fasting for at least 8 hours.  Glucose, capillary     Status: Abnormal   Collection Time: 04/30/24  3:21 AM  Result Value Ref Range   Glucose-Capillary 173 (H) 70 - 99 mg/dL    Comment: Glucose reference range applies only to samples taken after fasting for at least 8 hours.  Heparin  level (unfractionated)     Status: None   Collection Time: 04/30/24  3:30 AM  Result Value Ref Range   Heparin  Unfractionated 0.56 0.30 - 0.70 IU/mL    Comment: (NOTE) The clinical reportable range upper limit is being lowered to >1.10 to align with the FDA approved guidance for the current laboratory assay.  If heparin  results are below expected values, and patient dosage has  been confirmed, suggest follow up testing of antithrombin III levels. Performed at [facility], [address], [place], [place] [address]   Renal function panel (daily at 0500)     Status: Abnormal   Collection Time: 04/30/24   3:30 AM  Result Value Ref Range   Sodium 135 135 - 145 mmol/L   Potassium 4.7 3.5 - 5.1 mmol/L   Chloride 104 98 - 111 mmol/L   CO2 23  22 - 32 mmol/L   Glucose, Bld 171 (H) 70 - 99 mg/dL    Comment: Glucose reference range applies only to samples taken after fasting for at least 8 hours.   BUN 50 (H) 8 - 23 mg/dL   Creatinine, Ser 5.62 (H) 0.61 - 1.24 mg/dL   Calcium  7.8 (L) 8.9 - 10.3 mg/dL   Phosphorus 2.7 2.5 - 4.6 mg/dL   Albumin  2.0 (L) 3.5 - 5.0 g/dL   GFR, Estimated 43 (L) >60 mL/min    Comment: (NOTE) Calculated using the CKD-EPI Creatinine Equation (2021)    Anion gap 8 5 - 15    Comment: Performed at [facility], [address] [place], [place] [address]  Magnesium      Status: None   Collection Time: 04/30/24  3:30 AM  Result Value Ref Range   Magnesium  2.4 1.7 - 2.4 mg/dL    Comment: Performed at [facility], [address] [place], [place] [address]  CBC     Status: Abnormal   Collection Time: 04/30/24  3:30 AM  Result Value Ref Range   WBC 14.5 (H) 4.0 - 10.5 K/uL   RBC 2.90 (L) 4.22 - 5.81 MIL/uL   Hemoglobin 8.4 (L) 13.0 - 17.0 g/dL   HCT 96.2 (L) 95.2 - 84.1 %   MCV 91.4 80.0 - 100.0 fL   MCH 29.0 26.0 - 34.0 pg   MCHC 31.7 30.0 - 36.0 g/dL   RDW 32.4 (H) 40.1 - 02.7 %   Platelets 217 150 - 400 K/uL   nRBC 0.0 0.0 - 0.2 %    Comment: Performed at [facility], [address] [place], [place] [address]  Glucose, capillary     Status: Abnormal   Collection Time: 04/30/24  7:31 AM  Result Value Ref Range   Glucose-Capillary 179 (H) 70 - 99 mg/dL    Comment: Glucose reference range applies only to samples taken after fasting for at least 8 hours.  Glucose, capillary     Status: None   Collection Time: 04/30/24 11:21 AM  Result Value Ref Range   Glucose-Capillary 89 70 - 99 mg/dL    Comment: Glucose reference range applies only to samples taken after fasting for at least 8 hours.  Glucose, capillary     Status: Abnormal   Collection  Time: 04/30/24  3:46 PM  Result Value Ref Range   Glucose-Capillary 156 (H) 70 - 99 mg/dL    Comment: Glucose reference range applies only to samples taken after fasting for at least 8 hours.  Renal function panel (daily at 1600)     Status: Abnormal   Collection Time: 04/30/24  4:11 PM  Result Value Ref Range   Sodium 135 135 - 145 mmol/L   Potassium 4.9 3.5 - 5.1 mmol/L   Chloride 105 98 - 111 mmol/L   CO2 23 22 - 32 mmol/L   Glucose, Bld 145 (H) 70 - 99 mg/dL    Comment: Glucose reference range applies only to samples taken after fasting for at least 8 hours.   BUN 49 (H) 8 - 23 mg/dL   Creatinine, Ser 4.40 (H) 0.61 - 1.24 mg/dL   Calcium  7.9 (L) 8.9 - 10.3 mg/dL   Phosphorus 2.4 (L) 2.5 - 4.6 mg/dL   Albumin  2.1 (L) 3.5 - 5.0 g/dL   GFR, Estimated 46 (L) >60 mL/min    Comment: (NOTE) Calculated using the CKD-EPI Creatinine Equation (2021)    Anion gap  7 5 - 15    Comment: Performed at [facility], [address] [place], [place] [address]  Glucose, capillary     Status: Abnormal   Collection Time: 04/30/24  7:59 PM  Result Value Ref Range   Glucose-Capillary 137 (H) 70 - 99 mg/dL    Comment: Glucose reference range applies only to samples taken after fasting for at least 8 hours.  Glucose, capillary     Status: Abnormal   Collection Time: 04/30/24 11:55 PM  Result Value Ref Range   Glucose-Capillary 132 (H) 70 - 99 mg/dL    Comment: Glucose reference range applies only to samples taken after fasting for at least 8 hours.  Glucose, capillary     Status: Abnormal   Collection Time: 05/01/24  4:06 AM  Result Value Ref Range   Glucose-Capillary 157 (H) 70 - 99 mg/dL    Comment: Glucose reference range applies only to samples taken after fasting for at least 8 hours.  Heparin  level (unfractionated)     Status: None   Collection Time: 05/01/24  4:17 AM  Result Value Ref Range   Heparin  Unfractionated 0.44 0.30 - 0.70 IU/mL    Comment: (NOTE) The clinical reportable  range upper limit is being lowered to >1.10 to align with the FDA approved guidance for the current laboratory assay.  If heparin  results are below expected values, and patient dosage has  been confirmed, suggest follow up testing of antithrombin III levels. Performed at [facility], [address], [place], [place] [address]   Renal function panel (daily at 0500)     Status: Abnormal   Collection Time: 05/01/24  4:17 AM  Result Value Ref Range   Sodium 135 135 - 145 mmol/L   Potassium 4.9 3.5 - 5.1 mmol/L   Chloride 105 98 - 111 mmol/L   CO2 25 22 - 32 mmol/L   Glucose, Bld 159 (H) 70 - 99 mg/dL    Comment: Glucose reference range applies only to samples taken after fasting for at least 8 hours.   BUN 38 (H) 8 - 23 mg/dL   Creatinine, Ser 2.44 (H) 0.61 - 1.24 mg/dL   Calcium  7.9 (L) 8.9 - 10.3 mg/dL   Phosphorus 2.2 (L) 2.5 - 4.6 mg/dL   Albumin  2.0 (L) 3.5 - 5.0 g/dL   GFR, Estimated 46 (L) >60 mL/min    Comment: (NOTE) Calculated using the CKD-EPI Creatinine Equation (2021)    Anion gap 5 5 - 15    Comment: Performed at [facility], [address], [place], [place] [address]  Magnesium      Status: None   Collection Time: 05/01/24  4:17 AM  Result Value Ref Range   Magnesium  2.4 1.7 - 2.4 mg/dL    Comment: Performed at [facility], [address], [place], [place] [address]  CBC     Status: Abnormal   Collection Time: 05/01/24  4:17 AM  Result Value Ref Range   WBC 13.6 (H) 4.0 - 10.5 K/uL   RBC 2.77 (L) 4.22 - 5.81 MIL/uL   Hemoglobin 7.9 (L) 13.0 - 17.0 g/dL   HCT 44.0 (L) 34.7 - 42.5 %   MCV 90.6 80.0 - 100.0 fL   MCH 28.5 26.0 - 34.0 pg   MCHC 31.5 30.0 - 36.0 g/dL   RDW 95.6 (H) 38.7 - 56.4 %   Platelets 135 (L) 150 - 400 K/uL   nRBC 0.0 0.0 - 0.2 %  Comment: Performed at [facility], [address] [place], [place] [address]    Imaging / Studies: No results found.     Medications / Allergies: per  chart  Antibiotics: Anti-infectives (From admission, onward)    Start     Dose/Rate Route Frequency Ordered Stop   04/29/24 1000  micafungin  (MYCAMINE ) 150 mg in sodium chloride  0.9 % 100 mL IVPB        150 mg 107.5 mL/hr over 1 Hours Intravenous Every 24 hours 04/28/24 1036     04/28/24 1345  micafungin  (MYCAMINE ) 100 mg in sodium chloride  0.9 % 100 mL IVPB        100 mg 105 mL/hr over 1 Hours Intravenous  Once 04/28/24 1245 04/28/24 1354   04/28/24 1000  micafungin  (MYCAMINE ) 100 mg in sodium chloride  0.9 % 100 mL IVPB  Status:  Discontinued        100 mg 105 mL/hr over 1 Hours Intravenous Every 24 hours 04/27/24 1219 04/28/24 1036   04/21/24 1400  clindamycin  (CLEOCIN ) 900 mg, gentamicin  (GARAMYCIN ) 240 mg in sodium chloride  0.9 % 1,000 mL for intraperitoneal lavage  Status:  Discontinued         Irrigation To Surgery 04/21/24 1346 04/21/24 1618   04/20/24 1400  piperacillin -tazobactam (ZOSYN ) IVPB 3.375 g        3.375 g 12.5 mL/hr over 240 Minutes Intravenous Every 8 hours 04/20/24 0755     04/20/24 1000  metroNIDAZOLE  (FLAGYL ) IVPB 500 mg  Status:  Discontinued        500 mg 100 mL/hr over 60 Minutes Intravenous Every 12 hours 04/20/24 0320 04/20/24 0752   04/20/24 1000  micafungin  (MYCAMINE ) 150 mg in sodium chloride  0.9 % 100 mL IVPB  Status:  Discontinued        150 mg 107.5 mL/hr over 1 Hours Intravenous Every 24 hours 04/20/24 0752 04/27/24 1219   04/20/24 0245  ceFEPIme  (MAXIPIME ) 2 g in sodium chloride  0.9 % 100 mL IVPB  Status:  Discontinued        2 g 200 mL/hr over 30 Minutes Intravenous Every 8 hours 04/20/24 0232 04/20/24 0752   04/20/24 0100  metroNIDAZOLE  (FLAGYL ) IVPB 500 mg        500 mg 100 mL/hr over 60 Minutes Intravenous On call to O.R. 04/20/24 0004 04/20/24 0100   04/14/24 1800  ceFEPIme  (MAXIPIME ) 2 g in sodium chloride  0.9 % 100 mL IVPB        2 g 200 mL/hr over 30 Minutes Intravenous Every 8 hours 04/14/24 1003 04/19/24 0957   04/14/24 1600  vancomycin   (VANCOCIN ) IVPB 1000 mg/200 mL premix  Status:  Discontinued        1,000 mg 200 mL/hr over 60 Minutes Intravenous Every 24 hours 04/13/24 1420 04/14/24 1000   04/14/24 1600  vancomycin  (VANCOREADY) IVPB 1500 mg/300 mL  Status:  Discontinued        1,500 mg 150 mL/hr over 120 Minutes Intravenous Every 24 hours 04/14/24 1002 04/16/24 0744   04/14/24 1200  vancomycin  (VANCOCIN ) IVPB 1000 mg/200 mL premix  Status:  Discontinued        1,000 mg 200 mL/hr over 60 Minutes Intravenous Every 24 hours 04/13/24 1053 04/13/24 1109   04/13/24 2200  ceFEPIme  (MAXIPIME ) 2 g in sodium chloride  0.9 % 100 mL IVPB  Status:  Discontinued        2 g 200 mL/hr over 30 Minutes Intravenous Every 12 hours 04/13/24 1036 04/13/24 1109   04/13/24  2200  ceFEPIme  (MAXIPIME ) 2 g in sodium chloride  0.9 % 100 mL IVPB  Status:  Discontinued        2 g 200 mL/hr over 30 Minutes Intravenous Every 12 hours 04/13/24 1425 04/14/24 1003   04/13/24 1500  vancomycin  (VANCOCIN ) 2,000 mg in sodium chloride  0.9 % 500 mL IVPB        2,000 mg 260 mL/hr over 120 Minutes Intravenous  Once 04/13/24 1409 04/13/24 1850   04/13/24 1500  ceFEPIme  (MAXIPIME ) 2 g in sodium chloride  0.9 % 100 mL IVPB  Status:  Discontinued        2 g 200 mL/hr over 30 Minutes Intravenous Every 12 hours 04/13/24 1420 04/13/24 1425   04/13/24 1500  metroNIDAZOLE  (FLAGYL ) IVPB 500 mg  Status:  Discontinued        500 mg 100 mL/hr over 60 Minutes Intravenous Every 12 hours 04/13/24 1420 04/17/24 0725   04/13/24 1200  vancomycin  (VANCOCIN ) 2,000 mg in sodium chloride  0.9 % 500 mL IVPB  Status:  Discontinued        2,000 mg 260 mL/hr over 120 Minutes Intravenous  Once 04/13/24 1052 04/13/24 1121   04/13/24 1100  metroNIDAZOLE  (FLAGYL ) IVPB 500 mg  Status:  Discontinued        500 mg 100 mL/hr over 60 Minutes Intravenous 2 times daily 04/13/24 1007 04/13/24 1109   04/13/24 1100  vancomycin  (VANCOCIN ) IVPB 1000 mg/200 mL premix  Status:  Discontinued        1,000  mg 200 mL/hr over 60 Minutes Intravenous  Once 04/13/24 1007 04/13/24 1020   04/13/24 1015  ceFEPIme  (MAXIPIME ) 2 g in sodium chloride  0.9 % 100 mL IVPB        2 g 200 mL/hr over 30 Minutes Intravenous STAT 04/13/24 1007 04/14/24 1721   04/10/24 2200  ceFAZolin  (ANCEF ) IVPB 2g/100 mL premix        2 g 200 mL/hr over 30 Minutes Intravenous Every 8 hours 04/10/24 1833 04/11/24 0531   04/10/24 0600  ceFAZolin  (ANCEF ) IVPB 2g/100 mL premix        2 g 200 mL/hr over 30 Minutes Intravenous On call to O.R. 04/10/24 0533 04/10/24 1526         Note: Portions of this report may have been transcribed using voice recognition software. Every effort was made to ensure accuracy; however, inadvertent computerized transcription errors may be present.   Any transcriptional errors that result from this process are unintentional.    Eddye Goodie, MD, FACS, MASCRS Esophageal, Gastrointestinal & Colorectal Surgery Robotic and Minimally Invasive Surgery  [facility] [address], [address] [place], [place] [address] [phone] Fax [phone] Main  CONTACT INFORMATION: Weekday (9AM-5PM): Call [facility] main office at [phone] Weeknight (5PM-9AM) or Weekend/Holiday: Check EPIC Web Links tab & use AMION (password  [email or web address]) for General Surgery [facility] coverage  Please, DO NOT use SecureChat  (it is not reliable communication to reach operating surgeons & will lead to a delay in care).   Epic staff messaging available for outptient concerns needing 1-2 business day response.      05/01/2024  6:59 AM

## 2024-05-02 DIAGNOSIS — G9341 Metabolic encephalopathy: Secondary | ICD-10-CM | POA: Diagnosis not present

## 2024-05-02 DIAGNOSIS — K631 Perforation of intestine (nontraumatic): Secondary | ICD-10-CM | POA: Diagnosis not present

## 2024-05-02 DIAGNOSIS — N179 Acute kidney failure, unspecified: Secondary | ICD-10-CM | POA: Diagnosis not present

## 2024-05-02 DIAGNOSIS — E119 Type 2 diabetes mellitus without complications: Secondary | ICD-10-CM | POA: Diagnosis not present

## 2024-05-02 LAB — IRON AND TIBC
Iron: 54 ug/dL (ref 45–182)
Saturation Ratios: 19 % (ref 17.9–39.5)
TIBC: 281 ug/dL (ref 250–450)
UIBC: 227 ug/dL

## 2024-05-02 LAB — CREATININE, FLUID (PLEURAL, PERITONEAL, JP DRAINAGE)
Creat, Fluid: 15.1 mg/dL
Creat, Fluid: 18.6 mg/dL

## 2024-05-02 LAB — GLUCOSE, CAPILLARY
Glucose-Capillary: 107 mg/dL — ABNORMAL HIGH (ref 70–99)
Glucose-Capillary: 132 mg/dL — ABNORMAL HIGH (ref 70–99)
Glucose-Capillary: 142 mg/dL — ABNORMAL HIGH (ref 70–99)
Glucose-Capillary: 150 mg/dL — ABNORMAL HIGH (ref 70–99)
Glucose-Capillary: 152 mg/dL — ABNORMAL HIGH (ref 70–99)
Glucose-Capillary: 172 mg/dL — ABNORMAL HIGH (ref 70–99)

## 2024-05-02 LAB — RENAL FUNCTION PANEL
Albumin: 2.2 g/dL — ABNORMAL LOW (ref 3.5–5.0)
Anion gap: 10 (ref 5–15)
BUN: 69 mg/dL — ABNORMAL HIGH (ref 8–23)
CO2: 23 mmol/L (ref 22–32)
Calcium: 8.6 mg/dL — ABNORMAL LOW (ref 8.9–10.3)
Chloride: 102 mmol/L (ref 98–111)
Creatinine, Ser: 2.96 mg/dL — ABNORMAL HIGH (ref 0.61–1.24)
GFR, Estimated: 22 mL/min — ABNORMAL LOW (ref >60)
Glucose, Bld: 123 mg/dL — ABNORMAL HIGH (ref 70–99)
Phosphorus: 5.1 mg/dL — ABNORMAL HIGH (ref 2.5–4.6)
Potassium: 5.3 mmol/L — ABNORMAL HIGH (ref 3.5–5.1)
Sodium: 135 mmol/L (ref 135–145)

## 2024-05-02 LAB — HEPARIN LEVEL (UNFRACTIONATED): Heparin Unfractionated: 0.53 [IU]/mL (ref 0.30–0.70)

## 2024-05-02 LAB — MAGNESIUM: Magnesium: 2.4 mg/dL (ref 1.7–2.4)

## 2024-05-02 LAB — FERRITIN: Ferritin: 588 ng/mL — ABNORMAL HIGH (ref 24–336)

## 2024-05-02 MED ORDER — MELATONIN 3 MG PO TABS
3.0000 mg | ORAL_TABLET | Freq: Once | ORAL | Status: AC | PRN
Start: 2024-05-02 — End: 2024-05-02
  Administered 2024-05-02: 3 mg via ORAL
  Filled 2024-05-02: qty 1

## 2024-05-02 MED ORDER — MELATONIN 3 MG PO TABS
3.0000 mg | ORAL_TABLET | Freq: Once | ORAL | Status: AC
  Administered 2024-05-02: 3 mg via ORAL
  Filled 2024-05-02: qty 1

## 2024-05-02 MED ORDER — SODIUM ZIRCONIUM CYCLOSILICATE 10 G PO PACK
10.0000 g | PACK | Freq: Every day | ORAL | Status: DC
  Administered 2024-05-02: 10 g
  Filled 2024-05-02: qty 1

## 2024-05-02 NOTE — Progress Notes (Signed)
 11 Days Post-Op  Subjective: Right JP with significant increase in output overnight (>1.2L). He otherwise remains stable. Tolerating TF. Off pressors.   ROS: See above, otherwise other systems negative  Objective: Vital signs in last 24 hours: Temp:  [97.5 F (36.4 C)-98.6 F (37 C)] 98.6 F (37 C) (06/21 0725) Pulse Rate:  [81-104] 98 (06/21 0700) Resp:  [15-25] 18 (06/21 0700) BP: (101-135)/(43-64) 117/62 (06/21 0700) SpO2:  [94 %-100 %] 98 % (06/21 0700) Weight:  [105.8 kg] 105.8 kg (06/21 0500) Last BM Date : 05/01/24  Intake/Output from previous day: 06/20 0701 - 06/21 0700 In: 3104 [I.V.:381.5; NG/GT:2320; IV Piggyback:402.5] Out: 4433.9 [Urine:650; Drains:2305; Stool:1375] Intake/Output this shift: Total I/O In: 85.3 [I.V.:13.6; NG/GT:71.7] Out: 410 [Drains:410]  PE: Gen: male, NAD Resp: Equal chest rise CV: Tachy to 100s Abd: soft, non-distended, midline dressing clean and dry, ostomy with liquid stool in the bag, urostomy appears pink with minimal output in the bag.   Lab Results:  Recent Labs    05/01/24 0417 05/01/24 1313  WBC 13.6* 16.4*  HGB 7.9* 8.2*  HCT 25.1* 26.3*  PLT 135* 111*   BMET Recent Labs    05/01/24 0417 05/02/24 0515  NA 135 135  K 4.9 5.3*  CL 105 102  CO2 25 23  GLUCOSE 159* 123*  BUN 38* 69*  CREATININE 1.58* 2.96*  CALCIUM  7.9* 8.6*   PT/INR No results for input(s): LABPROT, INR in the last 72 hours. CMP     Component Value Date/Time   NA 135 05/02/2024 0515   K 5.3 (H) 05/02/2024 0515   CL 102 05/02/2024 0515   CO2 23 05/02/2024 0515   GLUCOSE 123 (H) 05/02/2024 0515   BUN 69 (H) 05/02/2024 0515   CREATININE 2.96 (H) 05/02/2024 0515   CALCIUM  8.6 (L) 05/02/2024 0515   PROT 5.5 (L) 04/27/2024 0453   ALBUMIN  2.2 (L) 05/02/2024 0515   AST 20 04/27/2024 0453   ALT 29 04/27/2024 0453   ALKPHOS 58 04/27/2024 0453   BILITOT 0.6 04/27/2024 0453   GFRNONAA 22 (L) 05/02/2024 0515   GFRAA >60 12/09/2018 0549    Lipase     Component Value Date/Time   LIPASE 22 11/09/2020 1201    Studies/Results: No results found.  Anti-infectives: Anti-infectives (From admission, onward)    Start     Dose/Rate Route Frequency Ordered Stop   05/01/24 1400  piperacillin -tazobactam (ZOSYN ) IVPB 2.25 g        2.25 g 100 mL/hr over 30 Minutes Intravenous Every 8 hours 05/01/24 0957     05/01/24 1045  micafungin  (MYCAMINE ) 100 mg in sodium chloride  0.9 % 100 mL IVPB        100 mg 105 mL/hr over 1 Hours Intravenous Every 24 hours 05/01/24 0949     04/29/24 1000  micafungin  (MYCAMINE ) 150 mg in sodium chloride  0.9 % 100 mL IVPB  Status:  Discontinued        150 mg 107.5 mL/hr over 1 Hours Intravenous Every 24 hours 04/28/24 1036 05/01/24 0949   04/28/24 1345  micafungin  (MYCAMINE ) 100 mg in sodium chloride  0.9 % 100 mL IVPB        100 mg 105 mL/hr over 1 Hours Intravenous  Once 04/28/24 1245 04/28/24 1354   04/28/24 1000  micafungin  (MYCAMINE ) 100 mg in sodium chloride  0.9 % 100 mL IVPB  Status:  Discontinued        100 mg 105 mL/hr over 1 Hours Intravenous Every 24 hours  04/27/24 1219 04/28/24 1036   04/21/24 1400  clindamycin  (CLEOCIN ) 900 mg, gentamicin  (GARAMYCIN ) 240 mg in sodium chloride  0.9 % 1,000 mL for intraperitoneal lavage  Status:  Discontinued         Irrigation To Surgery 04/21/24 1346 04/21/24 1618   04/20/24 1400  piperacillin -tazobactam (ZOSYN ) IVPB 3.375 g  Status:  Discontinued        3.375 g 12.5 mL/hr over 240 Minutes Intravenous Every 8 hours 04/20/24 0755 05/01/24 0957   04/20/24 1000  metroNIDAZOLE  (FLAGYL ) IVPB 500 mg  Status:  Discontinued        500 mg 100 mL/hr over 60 Minutes Intravenous Every 12 hours 04/20/24 0320 04/20/24 0752   04/20/24 1000  micafungin  (MYCAMINE ) 150 mg in sodium chloride  0.9 % 100 mL IVPB  Status:  Discontinued        150 mg 107.5 mL/hr over 1 Hours Intravenous Every 24 hours 04/20/24 0752 04/27/24 1219   04/20/24 0245  ceFEPIme  (MAXIPIME ) 2 g in sodium  chloride 0.9 % 100 mL IVPB  Status:  Discontinued        2 g 200 mL/hr over 30 Minutes Intravenous Every 8 hours 04/20/24 0232 04/20/24 0752   04/20/24 0100  metroNIDAZOLE  (FLAGYL ) IVPB 500 mg        500 mg 100 mL/hr over 60 Minutes Intravenous On call to O.R. 04/20/24 0004 04/20/24 0100   04/14/24 1800  ceFEPIme  (MAXIPIME ) 2 g in sodium chloride  0.9 % 100 mL IVPB        2 g 200 mL/hr over 30 Minutes Intravenous Every 8 hours 04/14/24 1003 04/19/24 0957   04/14/24 1600  vancomycin  (VANCOCIN ) IVPB 1000 mg/200 mL premix  Status:  Discontinued        1,000 mg 200 mL/hr over 60 Minutes Intravenous Every 24 hours 04/13/24 1420 04/14/24 1000   04/14/24 1600  vancomycin  (VANCOREADY) IVPB 1500 mg/300 mL  Status:  Discontinued        1,500 mg 150 mL/hr over 120 Minutes Intravenous Every 24 hours 04/14/24 1002 04/16/24 0744   04/14/24 1200  vancomycin  (VANCOCIN ) IVPB 1000 mg/200 mL premix  Status:  Discontinued        1,000 mg 200 mL/hr over 60 Minutes Intravenous Every 24 hours 04/13/24 1053 04/13/24 1109   04/13/24 2200  ceFEPIme  (MAXIPIME ) 2 g in sodium chloride  0.9 % 100 mL IVPB  Status:  Discontinued        2 g 200 mL/hr over 30 Minutes Intravenous Every 12 hours 04/13/24 1036 04/13/24 1109   04/13/24 2200  ceFEPIme  (MAXIPIME ) 2 g in sodium chloride  0.9 % 100 mL IVPB  Status:  Discontinued        2 g 200 mL/hr over 30 Minutes Intravenous Every 12 hours 04/13/24 1425 04/14/24 1003   04/13/24 1500  vancomycin  (VANCOCIN ) 2,000 mg in sodium chloride  0.9 % 500 mL IVPB        2,000 mg 260 mL/hr over 120 Minutes Intravenous  Once 04/13/24 1409 04/13/24 1850   04/13/24 1500  ceFEPIme  (MAXIPIME ) 2 g in sodium chloride  0.9 % 100 mL IVPB  Status:  Discontinued        2 g 200 mL/hr over 30 Minutes Intravenous Every 12 hours 04/13/24 1420 04/13/24 1425   04/13/24 1500  metroNIDAZOLE  (FLAGYL ) IVPB 500 mg  Status:  Discontinued        500 mg 100 mL/hr over 60 Minutes Intravenous Every 12 hours 04/13/24  1420 04/17/24 0725   04/13/24 1200  vancomycin  (  VANCOCIN ) 2,000 mg in sodium chloride  0.9 % 500 mL IVPB  Status:  Discontinued        2,000 mg 260 mL/hr over 120 Minutes Intravenous  Once 04/13/24 1052 04/13/24 1121   04/13/24 1100  metroNIDAZOLE  (FLAGYL ) IVPB 500 mg  Status:  Discontinued        500 mg 100 mL/hr over 60 Minutes Intravenous 2 times daily 04/13/24 1007 04/13/24 1109   04/13/24 1100  vancomycin  (VANCOCIN ) IVPB 1000 mg/200 mL premix  Status:  Discontinued        1,000 mg 200 mL/hr over 60 Minutes Intravenous  Once 04/13/24 1007 04/13/24 1020   04/13/24 1015  ceFEPIme  (MAXIPIME ) 2 g in sodium chloride  0.9 % 100 mL IVPB        2 g 200 mL/hr over 30 Minutes Intravenous STAT 04/13/24 1007 04/14/24 1721   04/10/24 2200  ceFAZolin  (ANCEF ) IVPB 2g/100 mL premix        2 g 200 mL/hr over 30 Minutes Intravenous Every 8 hours 04/10/24 1833 04/11/24 0531   04/10/24 0600  ceFAZolin  (ANCEF ) IVPB 2g/100 mL premix        2 g 200 mL/hr over 30 Minutes Intravenous On call to O.R. 04/10/24 0533 04/10/24 1526       Assessment/Plan  73 y/o M s/p parastomal hernia repair c/b perforation requiring ex-lap and SBR followed by abdominal wall closure and end ileostomy creation with hernia repair.  - Neph following, appreciate their recs - CCM on board, continue ICU for now - Continue current diet - Drain Cr sent today, will follow up the result   LOS: 22 days   Cordella DELENA Polly Marlis Cheron Surgery 05/02/2024, 7:57 AM Please see Amion for pager number during day hours 7:00am-4:30pm or 7:00am -11:30am on weekends

## 2024-05-02 NOTE — Progress Notes (Signed)
 eLink Physician-Brief Progress Note Patient Name: Kristopher Thompson DOB: 1951/11/01 MRN: [id number]   Date of Service  05/02/2024  HPI/Events of Note  Insomnia  eICU Interventions  Melatonin ordered      Intervention Category Intermediate Interventions: Other:  Jordon Kristiansen G Rosanna Bickle 05/02/2024, 7:58 PM

## 2024-05-02 NOTE — Progress Notes (Signed)
 NAME:  Kristopher Thompson, MRN:  [id number], DOB:  01-05-1951, LOS: 22 ADMISSION DATE:  04/10/2024, CONSULTATION DATE:  04/13/2024 REFERRING MD:  Dr Celinda, CHIEF COMPLAINT: Sepsis  History of Present Illness:  S/p repair of incisional/parastomal and ventral hernias, left inguinal incarcerated , abdominal hernias, lysis of adhesions, urostomy revision  Transferred to the ICU secondary to tachycardia, tachypnea  History of bladder cancer, prostate cancer, diastolic heart failure, diabetes, right bundle branch block, multiple abdominal surgeries  Update 04/20/24: ccm reconsulted post operatively after pt had perforated bowel and returned to OR. Pt was requiring neo and norepi. He remains intubated at this time. Profoundly acidotic with bicarb of 12.  Started on sodium bicarb infusion and bolus prior to initiation. Follow labs in am. Updated wife and son at bedside.   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   moderate atypia - right low back   Atypical nevus 04/04/2007   moderate to marked - right upper back (wider shave)   Atypical nevus 04/04/2007   moderate atypia - center chest (wider shave)   Atypical nevus 04/04/2007   slight atypia - right thigh   Atypical nevus 11/29/2011   mild atypia - center upper back   Atypical nevus 11/29/2011   mild atypia - center chest   Bacteremia due to Klebsiella pneumoniae 10/09/2017   Bladder cancer (HCC) dx 07/2017   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 denies   Sleep apnea    cpap   Squamous cell carcinoma of skin 05/22/2013   left cheek - CX3 + 5FU   Wears glasses    Significant Hospital Events: Including procedures, antibiotic start and stop dates in addition to other pertinent events   04/10/2024-laparoscopic surgery 04/13/24 CCM consult. Incr NE. Art line  6/3 weaning NE. Gently diuresed w 3L off  6/4 off NE. Pulled out his NGT refused replacement. After multiple emesis  6/5 cont NGT 6/6 tx to med surg 6/9: taken back to OR for perforated bowel, post-op shock 6/10: Norepinephrine , Phenylephrine , and vasopressin , added steroids, 1u PRBCs, back to OR for washout and closure 6/11: Worsening AKI with fluid overload, start CRRT, decreasing pressor requirements 6/13 tolerating CRRT 6/14: Off CRRT, arousable.  Tolerated weaning for most of the day 6/13 6/14: Extubated, CRRT stopped. 6/15 transferred to [facility] for iHD; montoring for renal recovery 6/16 iHD, severely encephalopathic with high BUN 6/17 restarted CRRT for to ongoing uremia and encephalopathy 6/18 was awake enough to work with PT, OT  Interim History / Subjective:  Didn't sleep well overnight. Resting this morning. Wife at bedside  Objective    Blood pressure 117/62, pulse 98, temperature 98.6 F (37 C), temperature source Oral, resp. rate  18, height 5' 11 (1.803 m), weight 105.8 kg, SpO2 98%.        Intake/Output Summary (Last 24 hours) at 05/02/2024 0824 Last data filed at 05/02/2024 0743 Gross per 24 hour  Intake 3037.81 ml  Output 4787 ml  Net -1749.19 ml   Filed Weights   04/30/24 0439 05/01/24 0443 05/02/24 0500  Weight: 105.8 kg 105.8 kg 105.8 kg    Examination: General:chronically ill, obese, resting comfortably HEENT: cortrak in place, dry mucus membranes Neuro: awakens to  voice, trackes, follows commands, lethargic but oriented Chest: non labored, diminished bilaterally, no wheeze Heart: Regular rate and rhythm, no murmurs or gallops Abdomen: wound vac in place with abdominal drains - JP drains with clear yellow output  Labs and images reviewed  2.3L of drains yesterday 940 out this morning.  Uop trickle since am.   Resolved problem list  Septic shock Acute respiratory failure with hypoxia  Assessment and Plan  Robotic hernia repair-- incisional, parastomal, left inguinal Septic shock due to acute peritonitis caused by bowel perforation - OR 6/9, back to the OR 6/10 for closure and ileal resection and end ileostomy creation General Surgery is following Off TPN Continue prolonged course of antibiotic therapy discussed between general surgery and ID Continue tube feeds 14 hours/day Encourage oral intake so we can stop tube feeds Wound VAC management per general surgery Cr being sent on JP drain output to r/o leak  Acute metabolic encephalopathy due to renal failure, uremia, ICU delirium Mental status has improved Avoid sedation  Acute kidney injury due to ischemic ATN from shock Acute metabolic acidosis due to renal failure hyperphosphatemia Nephrology is following No urgent RRT needs, monitoring UOP and labs  Diabetes type 2 Blood sugars are controlled Continue insulin  with CBG goal 140-180  Acute blood loss anemia Right flank hematoma History of PE, was on Eliquis at home H&H slightly trended down overnight from 8.4-7.9 Monitor H&H and transfuse if less than 7 Continue IV heparin  infusion, plan to transition to Eliquis once H&H is stable for few days  Chronic HFpEF Looks euvolemic, GDMT as tolerated  Moderate protein calorie malnutrition Obesity Continue dietary supplements Diet Counseling provided Continue full liquid diet  Deconditioning PT/OT evaluation  Best Practice (right click and Reselect all SmartList Selections  daily)   Diet/type: tubefeeds dysphagia diet as tolerated DVT prophylaxis systemic heparin   Pressure ulcer(s): per nursing assessment GI prophylaxis: N/A Lines: Central line, PICC art line  Foley:  N/A urostomy  Code Status:  full code Last date of multidisciplinary goals of care discussion: 6/21 spouse updated  at bedside      Verdon Gore, MD Pulmonary and Critical Care Medicine [facility] 05/02/2024 8:24 AM Pager: see AMION  If no response to pager, please call critical care on call (see AMION) until 7pm After 7:00 pm call Elink

## 2024-05-02 NOTE — Plan of Care (Signed)
 Vitals remained stable today,  He is having high JP output with no urine out put.  Surgery made aware by CCM.  Has had some bites of meals all day but still poor oral intake.  Encouraged to keep up PO intake.

## 2024-05-02 NOTE — Progress Notes (Signed)
 eLink Physician-Brief Progress Note Patient Name: Kristopher Thompson DOB: 11-02-1951 MRN: [id number]   Date of Service  05/02/2024  HPI/Events of Note  Asking for sleep ai  eICU Interventions  Melatonin ordered     Intervention Category Minor Interventions: Other:  Kristopher Thompson 05/02/2024, 2:49 AM

## 2024-05-02 NOTE — Progress Notes (Signed)
 PHARMACY - ANTICOAGULATION CONSULT NOTE  Pharmacy Consult for Heparin  Indication: h/o PE  Allergies  Allergen Reactions   Demerol  [Meperidine ] Nausea And Vomiting and Other (See Comments)    SEVERE NAUSEA    Patient Measurements: Height: 5' 11 (180.3 cm) Weight: 105.8 kg (233 lb 4 oz) IBW/kg (Calculated) : 75.3 HEPARIN  DW (KG): 100.8  Vital Signs: Temp: 97.8 F (36.6 C) (06/21 0329) Temp Source: Oral (06/21 0329) BP: 117/62 (06/21 0700) Pulse Rate: 98 (06/21 0700)  Labs: Recent Labs    04/30/24 0330 04/30/24 1611 05/01/24 0417 05/01/24 1313 05/02/24 0515  HGB 8.4*  --  7.9* 8.2*  --   HCT 26.5*  --  25.1* 26.3*  --   PLT 217  --  135* 111*  --   HEPARINUNFRC 0.56  --  0.44  --  0.53  CREATININE 1.67* 1.58* 1.58*  --  2.96*    Estimated Creatinine Clearance: 28.3 mL/min (A) (by C-G formula based on SCr of 2.96 mg/dL (H)).   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   moderate atypia - right low back   Atypical nevus 04/04/2007   moderate to marked - right upper back (wider shave)   Atypical nevus 04/04/2007   moderate atypia - center chest (wider shave)   Atypical nevus 04/04/2007   slight atypia - right thigh   Atypical nevus 11/29/2011   mild atypia - center upper back   Atypical nevus 11/29/2011   mild atypia - center chest   Bacteremia due to Klebsiella pneumoniae 10/09/2017   Bladder cancer (HCC) dx 07/2017   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 denies   Sleep apnea    cpap   Squamous cell carcinoma of skin 05/22/2013   left cheek - CX3 + 5FU   Wears glasses     Assessment:  Patient with prolonged hospitalization including post-op complications resulting in bowel perforation with open abdomen. Has been closed and transferred to [facility]. On Eliquis PTA for hx of PE. Has been on heparin  gtt since 6/6.   AM heparin  level therapeutic at 0.53, CBC 6/20 was stable. Was held 6/20 for bleeding in ileostomy and resumed, no additional bleeding noted by RN. Nephro started ESA + IV iron  x1, 6/21 Fe 54, TSAT 19, Ferr 588   Goal of Therapy:  Heparin  level 0.3-0.7 units/ml Monitor platelets by anticoagulation protocol: Yes   Plan:  Continue heparin  at 1900u/hr.  Monitor for s/sx of bleeding.  Monitor CBC and heparin  level daily.  F/u in next few days about ability to transition back to DOAC. Awaiting improvement in renal fxn and mental status. Some discussion about potential need for PEG pending patient trajectory.   Randall Call, PharmD, BCPS 05/02/24  7:31 AM

## 2024-05-02 NOTE — Progress Notes (Signed)
 [facility] NEPHROLOGY PROGRESS NOTE  Assessment/ Plan: Pt is a 73 y.o. yo male  with  hypertension, DM, CAD, hx bladder cancer, CHF, right bundle branch block who had acute bowel perforation and status post OR for bowel repair on 6/8, lysis of adhesion, ileal resection end ileostomy hernia repairs, septic shock seen for AKI and oliguria.   # Acute kidney injury, oliguric with anasarca likely ischemic ATN due to septic shock and obstructive uropathy given bilateral hydronephrosis.  CRRT 6/11-6/14 -  Because of the patient's persistent altered mental status and high metabolic state with elevated BUN we putting back on CRRT on 6/17- 6/20 -  then took off- BUN and crt rising not surprisingly-  no indications for RRT at this moment-  surgery checking crt on drain output to rule out leak given less UOP of late  # Bilateral hydronephrosis severe on the left side: S/p revision of ileal conduit 5/30,  urology following. Now no UOP brings to mind possible disruption-  checking surgical drain output for crt    # Acute bowel perforation is status post bowel repair ileal conduit and hernia repairs urostomy ileal conduit revision.  As per surgical team.    # HTN: Continue current medications and adjust volume status with dialysis as tolerated   # Acute hypoxic respiratory failure/VDRF per pulmonary team. Extubated 6/14, currently on nasal cannula  # Altered mental status: Likely multifactorial.  Uremia may have played some role.  Improving slowly   Anemia-  added ESA -  iron  stores OK  Hyperk-  give lokelma      Subjective:   had stopped CRRT yesterday (6/20) for hemodynamic stability and good numbers-  at least 650 of UOP-  BUN and crt up-  then nursing tells me actually no output from urostomy but lots of surgical drain output  Objective Vital signs in last 24 hours: Vitals:   05/02/24 0600 05/02/24 0630 05/02/24 0700 05/02/24 0725  BP: (!) 113/57  117/62   Pulse: 98 (!) 104 98    Resp: 18 (!) 24 18   Temp:    98.6 F (37 C)  TempSrc:    Oral  SpO2: 94% 98% 98%   Weight:      Height:       Weight change: 0 kg  Intake/Output Summary (Last 24 hours) at 05/02/2024 0806 Last data filed at 05/02/2024 0743 Gross per 24 hour  Intake 3037.81 ml  Output 4787 ml  Net -1749.19 ml       Labs: RENAL PANEL Recent Labs  Lab 04/28/24 0441 04/28/24 1821 04/29/24 0355 04/29/24 0356 04/29/24 1600 04/30/24 0330 04/30/24 1611 05/01/24 0417 05/02/24 0515  NA 138   < >  --    < > 139 135 135 135 135  K 3.9   < >  --    < > 4.5 4.7 4.9 4.9 5.3*  CL 102   < >  --    < > 104 104 105 105 102  CO2 23   < >  --    < > 25 23 23 25 23   GLUCOSE 154*   < >  --    < > 120* 171* 145* 159* 123*  BUN 107*   < >  --    < > 66* 50* 49* 38* 69*  CREATININE 3.00*   < >  --    < > 1.97* 1.67* 1.58* 1.58* 2.96*  CALCIUM  8.1*   < >  --    < >  8.1* 7.8* 7.9* 7.9* 8.6*  MG 2.2  --  2.3  --   --  2.4  --  2.4 2.4  PHOS 5.1*   < >  --    < > 3.2 2.7 2.4* 2.2* 5.1*  ALBUMIN  2.1*   < >  --    < > 2.1* 2.0* 2.1* 2.0* 2.2*   < > = values in this interval not displayed.    Liver Function Tests: Recent Labs  Lab 04/26/24 0644 04/27/24 0453 04/28/24 0441 04/30/24 1611 05/01/24 0417 05/02/24 0515  AST 25 20  --   --   --   --   ALT 33 29  --   --   --   --   ALKPHOS 58 58  --   --   --   --   BILITOT 0.9 0.6  --   --   --   --   PROT 5.6* 5.5*  --   --   --   --   ALBUMIN  2.0* 1.8*  1.8*   < > 2.1* 2.0* 2.2*   < > = values in this interval not displayed.   No results for input(s): LIPASE, AMYLASE in the last 168 hours. No results for input(s): AMMONIA in the last 168 hours. CBC: Recent Labs    04/28/24 0441 04/29/24 0355 04/30/24 0330 05/01/24 0417 05/01/24 1313 05/02/24 0515  HGB 8.7* 8.4* 8.4* 7.9* 8.2*  --   MCV 89.5 89.2 91.4 90.6 92.0  --   FERRITIN  --   --   --   --   --  588*  TIBC  --   --   --   --   --  281  IRON   --   --   --   --   --  54     Cardiac Enzymes: No results for input(s): CKTOTAL, CKMB, CKMBINDEX, TROPONINI in the last 168 hours. CBG: Recent Labs  Lab 05/01/24 1612 05/01/24 1942 05/01/24 2323 05/02/24 0328 05/02/24 0728  GLUCAP 111* 100* 105* 150* 172*    Iron  Studies:  Recent Labs    05/02/24 0515  IRON  54  TIBC 281  FERRITIN 588*   Studies/Results: No results found.    Medications: Infusions:  sodium chloride  Stopped (05/01/24 0014)   albumin  human     heparin  1,900 Units/hr (05/02/24 0743)   micafungin  (MYCAMINE ) 100 mg in sodium chloride  0.9 % 100 mL IVPB Stopped (05/01/24 1306)   piperacillin -tazobactam (ZOSYN )  IV Stopped (05/02/24 0536)    Scheduled Medications:  Chlorhexidine  Gluconate Cloth  6 each Topical Daily   darbepoetin (ARANESP ) injection - DIALYSIS  200 mcg Subcutaneous Q Fri-1800   feeding supplement (PROSource TF20)  60 mL Per Tube BID   feeding supplement (VITAL 1.5 CAL)  1,440 mL Per Tube Q24H   insulin  aspart  0-20 Units Subcutaneous Q4H   insulin  aspart  6 Units Subcutaneous 4 times per day   insulin  glargine-yfgn  25 Units Subcutaneous QHS   loperamide  HCl  2 mg Per Tube QHS   nutrition supplement (JUVEN)  1 packet Per Tube BID BM   mouth rinse  15 mL Mouth Rinse 4 times per day   polycarbophil  625 mg Per Tube BID    have reviewed scheduled and prn medications.  Physical Exam: General: nad,  able to answer some questions-   just slightly slow to respond Heart:normal rate, no rub Lungs: Coarse breath sound bilateral.  Bilateral chest rise, no  iwob Abdomen: Mild distention, no pain with palpation Extremities: trace  edema, warm and well-perfused Dialysis Access: LIJ Temporary HD line c/d/I-  in for 9 days   Tamer Baughman A Lizzett Nobile 05/02/2024,8:06 AM  LOS: 22 days

## 2024-05-03 ENCOUNTER — Inpatient Hospital Stay (HOSPITAL_COMMUNITY)

## 2024-05-03 DIAGNOSIS — K631 Perforation of intestine (nontraumatic): Secondary | ICD-10-CM | POA: Diagnosis not present

## 2024-05-03 DIAGNOSIS — E119 Type 2 diabetes mellitus without complications: Secondary | ICD-10-CM | POA: Diagnosis not present

## 2024-05-03 DIAGNOSIS — N179 Acute kidney failure, unspecified: Secondary | ICD-10-CM | POA: Diagnosis not present

## 2024-05-03 DIAGNOSIS — G9341 Metabolic encephalopathy: Secondary | ICD-10-CM | POA: Diagnosis not present

## 2024-05-03 LAB — GLUCOSE, CAPILLARY
Glucose-Capillary: 160 mg/dL — ABNORMAL HIGH (ref 70–99)
Glucose-Capillary: 116 mg/dL — ABNORMAL HIGH (ref 70–99)
Glucose-Capillary: 136 mg/dL — ABNORMAL HIGH (ref 70–99)
Glucose-Capillary: 125 mg/dL — ABNORMAL HIGH (ref 70–99)
Glucose-Capillary: 95 mg/dL (ref 70–99)
Glucose-Capillary: 120 mg/dL — ABNORMAL HIGH (ref 70–99)

## 2024-05-03 LAB — RENAL FUNCTION PANEL
Albumin: 2.3 g/dL — ABNORMAL LOW (ref 3.5–5.0)
Anion gap: 11 (ref 5–15)
BUN: 96 mg/dL — ABNORMAL HIGH (ref 8–23)
CO2: 20 mmol/L — ABNORMAL LOW (ref 22–32)
Calcium: 8.4 mg/dL — ABNORMAL LOW (ref 8.9–10.3)
Chloride: 101 mmol/L (ref 98–111)
Creatinine, Ser: 3.6 mg/dL — ABNORMAL HIGH (ref 0.61–1.24)
GFR, Estimated: 17 mL/min — ABNORMAL LOW (ref >60)
Glucose, Bld: 170 mg/dL — ABNORMAL HIGH (ref 70–99)
Phosphorus: 6.5 mg/dL — ABNORMAL HIGH (ref 2.5–4.6)
Potassium: 5.4 mmol/L — ABNORMAL HIGH (ref 3.5–5.1)
Sodium: 132 mmol/L — ABNORMAL LOW (ref 135–145)

## 2024-05-03 LAB — MAGNESIUM: Magnesium: 2.1 mg/dL (ref 1.7–2.4)

## 2024-05-03 LAB — HEPARIN LEVEL (UNFRACTIONATED): Heparin Unfractionated: 0.54 [IU]/mL (ref 0.30–0.70)

## 2024-05-03 MED ORDER — IOHEXOL 350 MG/ML SOLN
75.0000 mL | Freq: Once | INTRAVENOUS | Status: AC | PRN
Start: 2024-05-03 — End: 2024-05-03
  Administered 2024-05-03: 75 mL via INTRAVENOUS

## 2024-05-03 MED ORDER — CHLORHEXIDINE GLUCONATE CLOTH 2 % EX PADS
6.0000 | MEDICATED_PAD | Freq: Every day | CUTANEOUS | Status: DC
Start: 2024-05-03 — End: 2024-05-12
  Administered 2024-05-03 – 2024-05-11 (×7): 6 via TOPICAL

## 2024-05-03 MED ORDER — HYDROXYZINE HCL 10 MG PO TABS
5.0000 mg | ORAL_TABLET | Freq: Once | ORAL | Status: DC
  Filled 2024-05-03: qty 1

## 2024-05-03 MED ORDER — SODIUM ZIRCONIUM CYCLOSILICATE 10 G PO PACK
10.0000 g | PACK | Freq: Two times a day (BID) | ORAL | Status: DC
  Administered 2024-05-03 – 2024-05-04 (×4): 10 g
  Filled 2024-05-03 (×4): qty 1

## 2024-05-03 NOTE — Progress Notes (Addendum)
 [facility] NEPHROLOGY PROGRESS NOTE  Assessment/ Plan: Pt is a 73 y.o. yo male  with  hypertension, DM, CAD, hx bladder cancer, CHF, right bundle branch block who had acute bowel perforation and status post OR for bowel repair on 6/8, lysis of adhesion, ileal resection end ileostomy hernia repairs, septic shock seen for AKI and oliguria.   # Acute kidney injury, oliguric with anasarca likely ischemic ATN due to septic shock and obstructive uropathy given bilateral hydronephrosis.  CRRT 6/11-6/14 -  Because of the patient's persistent altered mental status and high metabolic state with elevated BUN was put back on CRRT on 6/17- 6/20 -  then took off- BUN and crt rising not surprisingly-  no indications for RRT at this moment-  I dd not want to take a step back and put him on CRRT-  want to try and do IHD but could not do until AM-  will arrange for that -  temp HD cath is approaching time for replacement -  has been in 10 days   # Bilateral hydronephrosis severe on the left side: S/p revision of ileal conduit 5/30,  urology following. Now no UOP brings to mind possible disruption-  checking surgical drain output for crt -  seemed to be indicative of leak-  have talked to GU-  request CT abd/pelvis to eval    # Acute bowel perforation is status post bowel repair ileal conduit and hernia repairs urostomy ileal conduit revision.  As per surgical team.    # HTN: Continue current medications and adjust volume status with dialysis as tolerated   # Acute hypoxic respiratory failure/VDRF per pulmonary team. Extubated 6/14, currently on nasal cannula  # Altered mental status: Likely multifactorial.  Uremia may have played some role.  Improving slowly -  a little more somnolent today - uremia may have something to do with it   Anemia-  added ESA -  iron  stores OK  Hyperk-  give lokelma - inc dose today to maintain him until AM      Subjective:   had stopped CRRT  (6/20) for hemodynamic  stability and good numbers-  UOP has dropped off-  abdominal drainage has increased-  drainage yesterday sent for crt and result was 15 and 18-  concerning for possible leak.  He says his belly feels pretty descent-  BUN and crt cont to rise off of RRT  Objective Vital signs in last 24 hours: Vitals:   05/03/24 0454 05/03/24 0500 05/03/24 0530 05/03/24 0600  BP:    126/69  Pulse:  96 (!) 103 99  Resp:  18 19 17   Temp:      TempSrc:      SpO2:  95% 97% 96%  Weight: 105.8 kg     Height:       Weight change: 0 kg  Intake/Output Summary (Last 24 hours) at 05/03/2024 0724 Last data filed at 05/03/2024 9357 Gross per 24 hour  Intake 2408.2 ml  Output 5528 ml  Net -3119.8 ml       Labs: RENAL PANEL Recent Labs  Lab 04/29/24 0355 04/29/24 0356 04/30/24 0330 04/30/24 1611 05/01/24 0417 05/02/24 0515 05/03/24 0453  NA  --    < > 135 135 135 135 132*  K  --    < > 4.7 4.9 4.9 5.3* 5.4*  CL  --    < > 104 105 105 102 101  CO2  --    < > 23 23 25  23  20*  GLUCOSE  --    < > 171* 145* 159* 123* 170*  BUN  --    < > 50* 49* 38* 69* 96*  CREATININE  --    < > 1.67* 1.58* 1.58* 2.96* 3.60*  CALCIUM   --    < > 7.8* 7.9* 7.9* 8.6* 8.4*  MG 2.3  --  2.4  --  2.4 2.4 2.1  PHOS  --    < > 2.7 2.4* 2.2* 5.1* 6.5*  ALBUMIN   --    < > 2.0* 2.1* 2.0* 2.2* 2.3*   < > = values in this interval not displayed.    Liver Function Tests: Recent Labs  Lab 04/27/24 0453 04/28/24 0441 05/01/24 0417 05/02/24 0515 05/03/24 0453  AST 20  --   --   --   --   ALT 29  --   --   --   --   ALKPHOS 58  --   --   --   --   BILITOT 0.6  --   --   --   --   PROT 5.5*  --   --   --   --   ALBUMIN  1.8*  1.8*   < > 2.0* 2.2* 2.3*   < > = values in this interval not displayed.   No results for input(s): LIPASE, AMYLASE in the last 168 hours. No results for input(s): AMMONIA in the last 168 hours. CBC: Recent Labs    04/28/24 0441 04/29/24 0355 04/30/24 0330 05/01/24 0417 05/01/24 1313  05/02/24 0515  HGB 8.7* 8.4* 8.4* 7.9* 8.2*  --   MCV 89.5 89.2 91.4 90.6 92.0  --   FERRITIN  --   --   --   --   --  588*  TIBC  --   --   --   --   --  281  IRON   --   --   --   --   --  54    Cardiac Enzymes: No results for input(s): CKTOTAL, CKMB, CKMBINDEX, TROPONINI in the last 168 hours. CBG: Recent Labs  Lab 05/02/24 1120 05/02/24 1524 05/02/24 1942 05/02/24 2338 05/03/24 0349  GLUCAP 107* 142* 152* 132* 160*    Iron  Studies:  Recent Labs    05/02/24 0515  IRON  54  TIBC 281  FERRITIN 588*   Studies/Results: No results found.    Medications: Infusions:  sodium chloride  Stopped (05/01/24 0014)   albumin  human     heparin  1,900 Units/hr (05/03/24 0600)   micafungin  (MYCAMINE ) 100 mg in sodium chloride  0.9 % 100 mL IVPB Stopped (05/02/24 1048)   piperacillin -tazobactam (ZOSYN )  IV Stopped (05/03/24 0532)    Scheduled Medications:  Chlorhexidine  Gluconate Cloth  6 each Topical Daily   darbepoetin (ARANESP ) injection - DIALYSIS  200 mcg Subcutaneous Q Fri-1800   feeding supplement (PROSource TF20)  60 mL Per Tube BID   feeding supplement (VITAL 1.5 CAL)  1,440 mL Per Tube Q24H   insulin  aspart  0-20 Units Subcutaneous Q4H   insulin  aspart  6 Units Subcutaneous 4 times per day   insulin  glargine-yfgn  25 Units Subcutaneous QHS   loperamide  HCl  2 mg Per Tube QHS   nutrition supplement (JUVEN)  1 packet Per Tube BID BM   mouth rinse  15 mL Mouth Rinse 4 times per day   polycarbophil  625 mg Per Tube BID   sodium zirconium cyclosilicate   10 g Per Tube Daily  have reviewed scheduled and prn medications.  Physical Exam: General: nad,  able to answer some questions-   just slightly slow to respond Heart:normal rate, no rub Lungs: Coarse breath sound bilateral.  Bilateral chest rise, no iwob Abdomen: Mild distention, no pain with palpation Extremities: trace  edema, warm and well-perfused Dialysis Access: LIJ Temporary HD line c/d/I-  in for 10  days   Jerrika Ledlow A Chandon Lazcano 05/03/2024,7:24 AM  LOS: 23 days

## 2024-05-03 NOTE — Progress Notes (Signed)
 PHARMACY - ANTICOAGULATION CONSULT NOTE  Pharmacy Consult for Heparin  Indication: h/o PE  Allergies  Allergen Reactions   Demerol  [Meperidine ] Nausea And Vomiting and Other (See Comments)    SEVERE NAUSEA    Patient Measurements: Height: 5' 11 (180.3 cm) Weight: 105.8 kg (233 lb 4 oz) IBW/kg (Calculated) : 75.3 HEPARIN  DW (KG): 100.8  Vital Signs: Temp: 98.2 F (36.8 C) (06/22 0722) Temp Source: Axillary (06/22 0722) BP: 126/69 (06/22 0600) Pulse Rate: 99 (06/22 0600)  Labs: Recent Labs    05/01/24 0417 05/01/24 1313 05/02/24 0515 05/03/24 0453  HGB 7.9* 8.2*  --   --   HCT 25.1* 26.3*  --   --   PLT 135* 111*  --   --   HEPARINUNFRC 0.44  --  0.53 0.54  CREATININE 1.58*  --  2.96* 3.60*    Estimated Creatinine Clearance: 23.3 mL/min (A) (by C-G formula based on SCr of 3.6 mg/dL (H)).   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   moderate atypia - right low back   Atypical nevus 04/04/2007   moderate to marked - right upper back (wider shave)   Atypical nevus 04/04/2007   moderate atypia - center chest (wider shave)   Atypical nevus 04/04/2007   slight atypia - right thigh   Atypical nevus 11/29/2011   mild atypia - center upper back   Atypical nevus 11/29/2011   mild atypia - center chest   Bacteremia due to Klebsiella pneumoniae 10/09/2017   Bladder cancer (HCC) dx 07/2017   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  s/p  cystectomy w/ ileal conduit urinary diversion   Candida infection    CHF (congestive heart failure) (HCC)    Colostomy in place (HCC)    since 08-08-2017-- per pt 12-25-2017 reddness around stoma   Diabetes mellitus without complication (HCC)    GERD (gastroesophageal reflux disease)    H/O hiatal hernia    History of sepsis 09/2017   dx bacteremia due to klebsiella pneumoniae,  post op intraabdominal abscess    Prostate cancer [facility]) urologist-- dr renda   10-02-2012 s/p  prostatectomy-- Stage T1c   RBBB    Renal disorder    pt. denies   Sleep apnea    cpap   Squamous cell carcinoma of skin 05/22/2013   left cheek - CX3 + 5FU   Wears glasses     Assessment:  Patient with prolonged hospitalization including post-op complications resulting in bowel perforation with open abdomen. Has been closed and transferred to [facility]. On Eliquis PTA for hx of PE. Has been on heparin  gtt since 6/6. Nephro started ESA + IV iron  x1, 6/21 Fe 54, TSAT 19, Ferr 588  AM heparin  level therapeutic at 0.54, CBC 6/20 was stable. Was held 6/20 for bleeding in ileostomy and resumed, no additional bleeding noted by RN. JP drain output more pink tinged today.   Goal of Therapy:  Heparin  level 0.3-0.7 units/ml Monitor platelets by anticoagulation protocol: Yes   Plan:  Continue heparin  at 1900u/hr.  Monitor for s/sx of bleeding.  Next CBC 6/23, heparin  level daily F/u in next few days about ability to transition back to DOAC. Awaiting improvement in renal fxn and mental status. Some discussion about potential need for PEG pending patient trajectory.   Randall Call, PharmD, BCPS 05/03/24 10:26 AM

## 2024-05-03 NOTE — Consult Note (Addendum)
 Urology reconsulted for decrease in urine output, increase in drain output  S: Kristopher Thompson is a 73 year old male status post robotic repair of incisional, parastomal, left inguinal incarcerated abdominal wall hernias with mesh, urostomy ileal conduit revision Apr 10, 2024 with Dr. Sheldon.  Urology was initially consulted for patient's renal function was normal with serum creatinine at 0.84 on 6/9.  It worsened and rose to 3.70 on 6/11.  CT A/P noted bilateral hydronephrosis.  Left side hydronephrosis has been chronic for many years but right side is a new finding.  His 6/12 JP creatinine was 2.9 and consistent with serum and therefore his increase in renal function was felt to be related to renal and prerenal causes and not obstruction or urine leak. I spoke with Dr. Prescilla, patient has been on dialysis but not needed it since 6/20.  His urostomy urine output suddenly dropped off on 6/21 and drain output picked up to 1515 mL, 2020 mL, 1130 mL and 650 mL over the last 4 shifts.  JP drain creatinine was sent on 2 occasions and noted to be 15 and then 18.6. Serum Cr 3.6. Consideration was made of taking drains off suction but concern about urinoma formation without understanding where the leak is emanating as well as staging the kidneys for hydronephrosis as a target for IR perc tubes (e.g. unilateral vs bilateral).   O: Vitals:   05/03/24 0600 05/03/24 0722  BP: 126/69   Pulse: 99   Resp: 17   Temp:  98.2 F (36.8 C)  SpO2: 96%    Alert and oriented Visiting with family Belly soft, wound vac in place. Liquid in the cansister. Both left and right JP with clear fluid  Urostomy pink and viable. Urine in the bag.   CBC    Component Value Date/Time   WBC 16.4 (H) 05/01/2024 1313   RBC 2.86 (L) 05/01/2024 1313   HGB 8.2 (L) 05/01/2024 1313   HCT 26.3 (L) 05/01/2024 1313   PLT 111 (L) 05/01/2024 1313   MCV 92.0 05/01/2024 1313   MCH 28.7 05/01/2024 1313   MCHC 31.2 05/01/2024 1313   RDW 16.3  (H) 05/01/2024 1313   LYMPHSABS 0.8 04/21/2024 1715   MONOABS 0.9 04/21/2024 1715   EOSABS 0.0 04/21/2024 1715   BASOSABS 0.1 04/21/2024 1715   Lab Results  Component Value Date   CREATININE 3.60 (H) 05/03/2024   CREATININE 2.96 (H) 05/02/2024   CREATININE 1.58 (H) 05/01/2024    A/P: 73 year old with history of prior robotic prostatectomy and subsequent radical cystectomy with ileal conduit in 2018 now status post extensive LOA, hernia repairs and urostomy revision.  Urine output, drain output and JP creatinine consistent with urine leak.  Discussed evaluation with Dr. Prescilla given the patient's renal function loopogram versus CT with IV contrast and given the patient's complicated history, prior hydronephrosis we will proceed with CT with IV contrast and delayed imaging. CT will help assess size and location of a leak and determine if there is still hydronephrosis. Then decision can be made on adjusting drain location or on/off suction as well as need for unilateral or bilateral nephrostomy tubes. Discussed with Dr. LITTIE and family. Discussed nature r/b/a to nephrostomy tubes as well.   ADD: Discussed with Dr. Renda. Reviewed CT. ? Contrast in large bowel, not certain he got po contrast. But that could be something else that was consumed. Fluid in abd likely urine given UOP going out through drains. Delayed images did not opacify the collecting system, ureter  or ileal conduit. Will plan on IR eval for bilateral nephrostomy tubes.

## 2024-05-03 NOTE — Progress Notes (Signed)
 12 Days Post-Op  Subjective: Drain Cr concerning for urine leak. K 5.4 this AM, serum Cr 3.6. His urostomy output remains minimal with increased JP output.    ROS: See above, otherwise other systems negative  Objective: Vital signs in last 24 hours: Temp:  [97.9 F (36.6 C)-98.4 F (36.9 C)] 98.2 F (36.8 C) (06/22 0722) Pulse Rate:  [93-106] 99 (06/22 0600) Resp:  [16-24] 17 (06/22 0600) BP: (104-146)/(61-73) 126/69 (06/22 0600) SpO2:  [91 %-100 %] 96 % (06/22 0600) Weight:  [105.8 kg] 105.8 kg (06/22 0454) Last BM Date : 05/02/24  Intake/Output from previous day: 06/21 0701 - 06/22 0700 In: 2408.2 [P.O.:360; I.V.:434.9; NG/GT:1358.3; IV Piggyback:254.9] Out: 5528 [Urine:28; Drains:3800; Stool:1700] Intake/Output this shift: No intake/output data recorded.  PE: Gen: male, NAD Resp: Equal chest rise CV: Tachy to 100s Abd: soft, non-distended, midline dressing clean and dry, ostomy with liquid stool in the bag, urostomy appears pink with minimal output in the bag, JP x 2 with yellow output.   Lab Results:  Recent Labs    05/01/24 0417 05/01/24 1313  WBC 13.6* 16.4*  HGB 7.9* 8.2*  HCT 25.1* 26.3*  PLT 135* 111*   BMET Recent Labs    05/02/24 0515 05/03/24 0453  NA 135 132*  K 5.3* 5.4*  CL 102 101  CO2 23 20*  GLUCOSE 123* 170*  BUN 69* 96*  CREATININE 2.96* 3.60*  CALCIUM  8.6* 8.4*   PT/INR No results for input(s): LABPROT, INR in the last 72 hours. CMP     Component Value Date/Time   NA 132 (L) 05/03/2024 0453   K 5.4 (H) 05/03/2024 0453   CL 101 05/03/2024 0453   CO2 20 (L) 05/03/2024 0453   GLUCOSE 170 (H) 05/03/2024 0453   BUN 96 (H) 05/03/2024 0453   CREATININE 3.60 (H) 05/03/2024 0453   CALCIUM  8.4 (L) 05/03/2024 0453   PROT 5.5 (L) 04/27/2024 0453   ALBUMIN  2.3 (L) 05/03/2024 0453   AST 20 04/27/2024 0453   ALT 29 04/27/2024 0453   ALKPHOS 58 04/27/2024 0453   BILITOT 0.6 04/27/2024 0453   GFRNONAA 17 (L) 05/03/2024 0453    GFRAA >60 12/09/2018 0549   Lipase     Component Value Date/Time   LIPASE 22 11/09/2020 1201    Studies/Results: No results found.  Anti-infectives: Anti-infectives (From admission, onward)    Start     Dose/Rate Route Frequency Ordered Stop   05/01/24 1400  piperacillin -tazobactam (ZOSYN ) IVPB 2.25 g        2.25 g 100 mL/hr over 30 Minutes Intravenous Every 8 hours 05/01/24 0957     05/01/24 1045  micafungin  (MYCAMINE ) 100 mg in sodium chloride  0.9 % 100 mL IVPB        100 mg 105 mL/hr over 1 Hours Intravenous Every 24 hours 05/01/24 0949     04/29/24 1000  micafungin  (MYCAMINE ) 150 mg in sodium chloride  0.9 % 100 mL IVPB  Status:  Discontinued        150 mg 107.5 mL/hr over 1 Hours Intravenous Every 24 hours 04/28/24 1036 05/01/24 0949   04/28/24 1345  micafungin  (MYCAMINE ) 100 mg in sodium chloride  0.9 % 100 mL IVPB        100 mg 105 mL/hr over 1 Hours Intravenous  Once 04/28/24 1245 04/28/24 1354   04/28/24 1000  micafungin  (MYCAMINE ) 100 mg in sodium chloride  0.9 % 100 mL IVPB  Status:  Discontinued        100  mg 105 mL/hr over 1 Hours Intravenous Every 24 hours 04/27/24 1219 04/28/24 1036   04/21/24 1400  clindamycin  (CLEOCIN ) 900 mg, gentamicin  (GARAMYCIN ) 240 mg in sodium chloride  0.9 % 1,000 mL for intraperitoneal lavage  Status:  Discontinued         Irrigation To Surgery 04/21/24 1346 04/21/24 1618   04/20/24 1400  piperacillin -tazobactam (ZOSYN ) IVPB 3.375 g  Status:  Discontinued        3.375 g 12.5 mL/hr over 240 Minutes Intravenous Every 8 hours 04/20/24 0755 05/01/24 0957   04/20/24 1000  metroNIDAZOLE  (FLAGYL ) IVPB 500 mg  Status:  Discontinued        500 mg 100 mL/hr over 60 Minutes Intravenous Every 12 hours 04/20/24 0320 04/20/24 0752   04/20/24 1000  micafungin  (MYCAMINE ) 150 mg in sodium chloride  0.9 % 100 mL IVPB  Status:  Discontinued        150 mg 107.5 mL/hr over 1 Hours Intravenous Every 24 hours 04/20/24 0752 04/27/24 1219   04/20/24 0245  ceFEPIme   (MAXIPIME ) 2 g in sodium chloride  0.9 % 100 mL IVPB  Status:  Discontinued        2 g 200 mL/hr over 30 Minutes Intravenous Every 8 hours 04/20/24 0232 04/20/24 0752   04/20/24 0100  metroNIDAZOLE  (FLAGYL ) IVPB 500 mg        500 mg 100 mL/hr over 60 Minutes Intravenous On call to O.R. 04/20/24 0004 04/20/24 0100   04/14/24 1800  ceFEPIme  (MAXIPIME ) 2 g in sodium chloride  0.9 % 100 mL IVPB        2 g 200 mL/hr over 30 Minutes Intravenous Every 8 hours 04/14/24 1003 04/19/24 0957   04/14/24 1600  vancomycin  (VANCOCIN ) IVPB 1000 mg/200 mL premix  Status:  Discontinued        1,000 mg 200 mL/hr over 60 Minutes Intravenous Every 24 hours 04/13/24 1420 04/14/24 1000   04/14/24 1600  vancomycin  (VANCOREADY) IVPB 1500 mg/300 mL  Status:  Discontinued        1,500 mg 150 mL/hr over 120 Minutes Intravenous Every 24 hours 04/14/24 1002 04/16/24 0744   04/14/24 1200  vancomycin  (VANCOCIN ) IVPB 1000 mg/200 mL premix  Status:  Discontinued        1,000 mg 200 mL/hr over 60 Minutes Intravenous Every 24 hours 04/13/24 1053 04/13/24 1109   04/13/24 2200  ceFEPIme  (MAXIPIME ) 2 g in sodium chloride  0.9 % 100 mL IVPB  Status:  Discontinued        2 g 200 mL/hr over 30 Minutes Intravenous Every 12 hours 04/13/24 1036 04/13/24 1109   04/13/24 2200  ceFEPIme  (MAXIPIME ) 2 g in sodium chloride  0.9 % 100 mL IVPB  Status:  Discontinued        2 g 200 mL/hr over 30 Minutes Intravenous Every 12 hours 04/13/24 1425 04/14/24 1003   04/13/24 1500  vancomycin  (VANCOCIN ) 2,000 mg in sodium chloride  0.9 % 500 mL IVPB        2,000 mg 260 mL/hr over 120 Minutes Intravenous  Once 04/13/24 1409 04/13/24 1850   04/13/24 1500  ceFEPIme  (MAXIPIME ) 2 g in sodium chloride  0.9 % 100 mL IVPB  Status:  Discontinued        2 g 200 mL/hr over 30 Minutes Intravenous Every 12 hours 04/13/24 1420 04/13/24 1425   04/13/24 1500  metroNIDAZOLE  (FLAGYL ) IVPB 500 mg  Status:  Discontinued        500 mg 100 mL/hr over 60 Minutes Intravenous  Every 12 hours  04/13/24 1420 04/17/24 0725   04/13/24 1200  vancomycin  (VANCOCIN ) 2,000 mg in sodium chloride  0.9 % 500 mL IVPB  Status:  Discontinued        2,000 mg 260 mL/hr over 120 Minutes Intravenous  Once 04/13/24 1052 04/13/24 1121   04/13/24 1100  metroNIDAZOLE  (FLAGYL ) IVPB 500 mg  Status:  Discontinued        500 mg 100 mL/hr over 60 Minutes Intravenous 2 times daily 04/13/24 1007 04/13/24 1109   04/13/24 1100  vancomycin  (VANCOCIN ) IVPB 1000 mg/200 mL premix  Status:  Discontinued        1,000 mg 200 mL/hr over 60 Minutes Intravenous  Once 04/13/24 1007 04/13/24 1020   04/13/24 1015  ceFEPIme  (MAXIPIME ) 2 g in sodium chloride  0.9 % 100 mL IVPB        2 g 200 mL/hr over 30 Minutes Intravenous STAT 04/13/24 1007 04/14/24 1721   04/10/24 2200  ceFAZolin  (ANCEF ) IVPB 2g/100 mL premix        2 g 200 mL/hr over 30 Minutes Intravenous Every 8 hours 04/10/24 1833 04/11/24 0531   04/10/24 0600  ceFAZolin  (ANCEF ) IVPB 2g/100 mL premix        2 g 200 mL/hr over 30 Minutes Intravenous On call to O.R. 04/10/24 0533 04/10/24 1526       Assessment/Plan  73 y/o M s/p parastomal hernia repair c/b perforation requiring ex-lap and SBR followed by abdominal wall closure and end ileostomy creation with hernia repair.  - Now with concern for urine leak. This appears to be well controlled with the drains as he is clinically stable. Urology has been engaged and plans for CT to stage the leak and evaluate for hydronephrosis - CCM on board, continue ICU for now - Continue current diet   LOS: 23 days   Cordella DELENA Polly Marlis Cheron Surgery 05/03/2024, 11:27 AM Please see Amion for pager number during day hours 7:00am-4:30pm or 7:00am -11:30am on weekends

## 2024-05-03 NOTE — Progress Notes (Signed)
 NAME:  Kristopher Thompson, MRN:  [id number], DOB:  1951-04-25, LOS: 23 ADMISSION DATE:  04/10/2024, CONSULTATION DATE:  04/13/2024 REFERRING MD:  Dr Celinda, CHIEF COMPLAINT: Sepsis  History of Present Illness:  S/p repair of incisional/parastomal and ventral hernias, left inguinal incarcerated , abdominal hernias, lysis of adhesions, urostomy revision  Transferred to the ICU secondary to tachycardia, tachypnea  History of bladder cancer, prostate cancer, diastolic heart failure, diabetes, right bundle branch block, multiple abdominal surgeries  Update 04/20/24: ccm reconsulted post operatively after pt had perforated bowel and returned to OR. Pt was requiring neo and norepi. He remains intubated at this time. Profoundly acidotic with bicarb of 12.  Started on sodium bicarb infusion and bolus prior to initiation. Follow labs in am. Updated wife and son at bedside.   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   moderate atypia - right low back   Atypical nevus 04/04/2007   moderate to marked - right upper back (wider shave)   Atypical nevus 04/04/2007   moderate atypia - center chest (wider shave)   Atypical nevus 04/04/2007   slight atypia - right thigh   Atypical nevus 11/29/2011   mild atypia - center upper back   Atypical nevus 11/29/2011   mild atypia - center chest   Bacteremia due to Klebsiella pneumoniae 10/09/2017   Bladder cancer (HCC) dx 07/2017   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 denies   Sleep apnea    cpap   Squamous cell carcinoma of skin 05/22/2013   left cheek - CX3 + 5FU   Wears glasses    Significant Hospital Events: Including procedures, antibiotic start and stop dates in addition to other pertinent events   04/10/2024-laparoscopic surgery 04/13/24 CCM consult. Incr NE. Art line  6/3 weaning NE. Gently diuresed w 3L off  6/4 off NE. Pulled out his NGT refused replacement. After multiple emesis  6/5 cont NGT 6/6 tx to med surg 6/9: taken back to OR for perforated bowel, post-op shock 6/10: Norepinephrine , Phenylephrine , and vasopressin , added steroids, 1u PRBCs, back to OR for washout and closure 6/11: Worsening AKI with fluid overload, start CRRT, decreasing pressor requirements 6/13 tolerating CRRT 6/14: Off CRRT, arousable.  Tolerated weaning for most of the day 6/13 6/14: Extubated, CRRT stopped. 6/15 transferred to [facility] for iHD; montoring for renal recovery 6/16 iHD, severely encephalopathic with high BUN 6/17 restarted CRRT for to ongoing uremia and encephalopathy 6/18 was awake enough to work with PT, OT  Interim History / Subjective:  No uop overnight, feeling fatigued. Family at bedside.  Minmal oral intake but tube feeds at goal.   Objective    Blood pressure 126/69, pulse 99, temperature 98.2  F (36.8 C), temperature source Axillary, resp. rate 17, height 5' 11 (1.803 m), weight 105.8 kg, SpO2 96%.        Intake/Output Summary (Last 24 hours) at 05/03/2024 0954 Last data filed at 05/03/2024 9357 Gross per 24 hour  Intake 2162.75 ml  Output 4588 ml  Net -2425.25 ml   Filed Weights   05/01/24 0443 05/02/24 0500 05/03/24 0454  Weight: 105.8 kg 105.8 kg 105.8 kg    Examination: General:chronically ill, obese, resting comfortably HEENT: cortrak in  place, dry mucus membranes Neuro: awakens to voice, trackes, follows commands, lethargic but oriented Chest: non labored, diminished bilaterally, no wheeze Heart: Regular rate and rhythm, no murmurs or gallops Abdomen: wound vac in place with abdominal drains - JP drains with clear yellow output  Labs and images reviewed JP drain with elevated Creatinine and copious output.  UOP almost nil.   Resolved problem list  Septic shock Acute respiratory failure with hypoxia  Assessment and Plan  Robotic hernia repair-- incisional, parastomal, left inguinal Septic shock due to acute peritonitis caused by bowel perforation - OR 6/9, back to the OR 6/10 for closure and ileal resection and end ileostomy creation General Surgery is following Off TPN Continue prolonged course of antibiotic therapy discussed between general surgery and ID Continue tube feeds 14 hours/day Encourage oral intake so we can stop tube feeds Wound VAC management per general surgery Concern for compromised ileal conduit. Discussed with nephrology, urology and general surgery. Plan to obtain imaging and will potentially need bilateral nephrostomy tubes.   Acute metabolic encephalopathy due to renal failure, uremia, ICU delirium Mental status has improved Avoid sedation  Acute kidney injury due to ischemic ATN from shock Acute metabolic acidosis due to renal failure hyperphosphatemia Nephrology is following No urgent RRT needs, monitoring UOP and labs  Diabetes type 2 Blood sugars are controlled Continue insulin  with CBG goal 140-180  Acute blood loss anemia Right flank hematoma History of PE, was on Eliquis at home H&H slightly trended down overnight from 8.4-7.9 Monitor H&H and transfuse if less than 7 Keep on heparin  for now given potential anticipated procedures.   Chronic HFpEF Looks euvolemic, GDMT as tolerated  Moderate protein calorie malnutrition Obesity Continue dietary  supplements Diet Counseling provided Continue full liquid diet  Deconditioning PT/OT evaluation  Best Practice (right click and Reselect all SmartList Selections daily)   Diet/type: tubefeeds dysphagia diet as tolerated DVT prophylaxis systemic heparin   Pressure ulcer(s): per nursing assessment GI prophylaxis: N/A Lines: Central line, PICC art line  Foley:  N/A urostomy  Code Status:  full code Last date of multidisciplinary goals of care discussion: 6/22 spouse updated  at bedside    I spent 50 minutes in total visit time for this patient, with more than 50% spent counseling/coordinating care.  Verdon Gore, MD Pulmonary and Critical Care Medicine [facility] 05/03/2024 9:55 AM Pager: see AMION  If no response to pager, please call critical care on call (see AMION) until 7pm After 7:00 pm call Elink

## 2024-05-04 ENCOUNTER — Inpatient Hospital Stay (HOSPITAL_COMMUNITY)

## 2024-05-04 DIAGNOSIS — K631 Perforation of intestine (nontraumatic): Secondary | ICD-10-CM | POA: Diagnosis not present

## 2024-05-04 DIAGNOSIS — E119 Type 2 diabetes mellitus without complications: Secondary | ICD-10-CM | POA: Diagnosis not present

## 2024-05-04 DIAGNOSIS — N179 Acute kidney failure, unspecified: Secondary | ICD-10-CM | POA: Diagnosis not present

## 2024-05-04 DIAGNOSIS — G9341 Metabolic encephalopathy: Secondary | ICD-10-CM | POA: Diagnosis not present

## 2024-05-04 HISTORY — PX: IR NEPHROSTOMY PLACEMENT RIGHT: IMG6064

## 2024-05-04 LAB — PROTIME-INR
INR: 1.1 (ref 0.8–1.2)
Prothrombin Time: 14.2 s (ref 11.4–15.2)

## 2024-05-04 LAB — RENAL FUNCTION PANEL
Albumin: 2.4 g/dL — ABNORMAL LOW (ref 3.5–5.0)
Anion gap: 13 (ref 5–15)
BUN: 109 mg/dL — ABNORMAL HIGH (ref 8–23)
CO2: 21 mmol/L — ABNORMAL LOW (ref 22–32)
Calcium: 9.2 mg/dL (ref 8.9–10.3)
Chloride: 99 mmol/L (ref 98–111)
Creatinine, Ser: 3.86 mg/dL — ABNORMAL HIGH (ref 0.61–1.24)
GFR, Estimated: 16 mL/min — ABNORMAL LOW (ref >60)
Glucose, Bld: 166 mg/dL — ABNORMAL HIGH (ref 70–99)
Phosphorus: 7.2 mg/dL — ABNORMAL HIGH (ref 2.5–4.6)
Potassium: 5 mmol/L (ref 3.5–5.1)
Sodium: 133 mmol/L — ABNORMAL LOW (ref 135–145)

## 2024-05-04 LAB — CBC
HCT: 28 % — ABNORMAL LOW (ref 39.0–52.0)
Hemoglobin: 9.1 g/dL — ABNORMAL LOW (ref 13.0–17.0)
MCH: 29.5 pg (ref 26.0–34.0)
MCHC: 32.5 g/dL (ref 30.0–36.0)
MCV: 90.9 fL (ref 80.0–100.0)
Platelets: 136 10*3/uL — ABNORMAL LOW (ref 150–400)
RBC: 3.08 MIL/uL — ABNORMAL LOW (ref 4.22–5.81)
RDW: 17.3 % — ABNORMAL HIGH (ref 11.5–15.5)
WBC: 12.4 10*3/uL — ABNORMAL HIGH (ref 4.0–10.5)
nRBC: 0 % (ref 0.0–0.2)

## 2024-05-04 LAB — GLUCOSE, CAPILLARY
Glucose-Capillary: 159 mg/dL — ABNORMAL HIGH (ref 70–99)
Glucose-Capillary: 161 mg/dL — ABNORMAL HIGH (ref 70–99)
Glucose-Capillary: 129 mg/dL — ABNORMAL HIGH (ref 70–99)
Glucose-Capillary: 151 mg/dL — ABNORMAL HIGH (ref 70–99)
Glucose-Capillary: 151 mg/dL — ABNORMAL HIGH (ref 70–99)
Glucose-Capillary: 127 mg/dL — ABNORMAL HIGH (ref 70–99)

## 2024-05-04 LAB — PREALBUMIN: Prealbumin: 28 mg/dL (ref 18–38)

## 2024-05-04 LAB — HEPARIN LEVEL (UNFRACTIONATED): Heparin Unfractionated: 0.58 [IU]/mL (ref 0.30–0.70)

## 2024-05-04 LAB — MAGNESIUM: Magnesium: 2 mg/dL (ref 1.7–2.4)

## 2024-05-04 MED ORDER — ALTEPLASE 2 MG IJ SOLR: 2.0000 mg | Freq: Once | INTRAMUSCULAR | Status: DC | PRN

## 2024-05-04 MED ORDER — MIDAZOLAM HCL 2 MG/2ML IJ SOLN
INTRAMUSCULAR | Status: AC
  Filled 2024-05-04: qty 2

## 2024-05-04 MED ORDER — HEPARIN SODIUM (PORCINE) 1000 UNIT/ML DIALYSIS: 1000.0000 [IU] | INTRAMUSCULAR | Status: DC | PRN

## 2024-05-04 MED ORDER — PROCHLORPERAZINE EDISYLATE 10 MG/2ML IJ SOLN
10.0000 mg | Freq: Once | INTRAMUSCULAR | Status: DC
  Filled 2024-05-04 (×2): qty 2

## 2024-05-04 MED ORDER — LIDOCAINE-PRILOCAINE 2.5-2.5 % EX CREA: 1.0000 | TOPICAL_CREAM | CUTANEOUS | Status: DC | PRN

## 2024-05-04 MED ORDER — SODIUM CHLORIDE 0.9% FLUSH
5.0000 mL | Freq: Three times a day (TID) | INTRAVENOUS | Status: DC
  Administered 2024-05-04 – 2024-05-07 (×9): 5 mL
  Administered 2024-05-07: 10 mL
  Administered 2024-05-07 – 2024-05-22 (×43): 5 mL

## 2024-05-04 MED ORDER — LIDOCAINE-EPINEPHRINE 1 %-1:100000 IJ SOLN
INTRAMUSCULAR | Status: AC
  Filled 2024-05-04: qty 1

## 2024-05-04 MED ORDER — ONDANSETRON HCL 4 MG/2ML IJ SOLN
INTRAMUSCULAR | Status: AC | PRN
Start: 2024-05-04 — End: 2024-05-04
  Administered 2024-05-04: 4 mg via INTRAVENOUS

## 2024-05-04 MED ORDER — SEVELAMER CARBONATE 800 MG PO TABS
800.0000 mg | ORAL_TABLET | Freq: Three times a day (TID) | ORAL | Status: DC
  Administered 2024-05-04 – 2024-05-07 (×10): 800 mg
  Filled 2024-05-04 (×11): qty 1

## 2024-05-04 MED ORDER — FENTANYL CITRATE (PF) 100 MCG/2ML IJ SOLN
INTRAMUSCULAR | Status: AC | PRN
  Administered 2024-05-04 (×2): 50 ug via INTRAVENOUS

## 2024-05-04 MED ORDER — LIDOCAINE-EPINEPHRINE 1 %-1:100000 IJ SOLN
20.0000 mL | Freq: Once | INTRAMUSCULAR | Status: AC
  Administered 2024-05-04: 20 mL

## 2024-05-04 MED ORDER — IOHEXOL 300 MG/ML  SOLN
50.0000 mL | Freq: Once | INTRAMUSCULAR | Status: AC | PRN
Start: 2024-05-04 — End: 2024-05-04
  Administered 2024-05-04: 15 mL

## 2024-05-04 MED ORDER — ANTICOAGULANT SODIUM CITRATE 4% (200MG/5ML) IV SOLN: 5.0000 mL | Status: DC | PRN

## 2024-05-04 MED ORDER — ONDANSETRON HCL 4 MG/2ML IJ SOLN
INTRAMUSCULAR | Status: AC
  Filled 2024-05-04: qty 2

## 2024-05-04 MED ORDER — LIDOCAINE HCL (PF) 1 % IJ SOLN: 5.0000 mL | INTRAMUSCULAR | Status: DC | PRN

## 2024-05-04 MED ORDER — MIDAZOLAM HCL 2 MG/2ML IJ SOLN
INTRAMUSCULAR | Status: AC | PRN
  Administered 2024-05-04: 1 mg via INTRAVENOUS
  Administered 2024-05-04 (×2): .5 mg via INTRAVENOUS

## 2024-05-04 MED ORDER — DIPHENHYDRAMINE HCL 12.5 MG/5ML PO ELIX
25.0000 mg | ORAL_SOLUTION | Freq: Three times a day (TID) | ORAL | Status: DC | PRN
  Filled 2024-05-04: qty 10

## 2024-05-04 MED ORDER — PENTAFLUOROPROP-TETRAFLUOROETH EX AERO: 1.0000 | INHALATION_SPRAY | CUTANEOUS | Status: DC | PRN

## 2024-05-04 MED ORDER — FENTANYL CITRATE (PF) 100 MCG/2ML IJ SOLN
INTRAMUSCULAR | Status: AC
  Filled 2024-05-04: qty 2

## 2024-05-04 MED ORDER — HEPARIN (PORCINE) 25000 UT/250ML-% IV SOLN
1900.0000 [IU]/h | INTRAVENOUS | Status: AC
  Administered 2024-05-05 – 2024-05-13 (×13): 1900 [IU]/h via INTRAVENOUS
  Filled 2024-05-04 (×14): qty 250

## 2024-05-04 NOTE — TOC Progression Note (Signed)
 Transition of Care [facility]) - Progression Note    Patient Details  Name: Kristopher Thompson MRN: [id number] Date of Birth: 08/08/1951  Transition of Care [facility]) CM/SW Contact  Lauraine FORBES Saa, LCSW Phone Number: 05/04/2024, 11:55 AM  Clinical Narrative:     11:56 AM Per progressions, patient is not yet medically ready for discharge to [facility]. CSW relayed information to [facility] admissions.  Expected Discharge Plan: Skilled Nursing Facility Barriers to Discharge: Continued Medical Work up, SNF Pending bed offer, English as a second language teacher  Expected Discharge Plan and Services In-house Referral: Clinical Social Work Discharge Planning Services: CM Consult Post Acute Care Choice: Skilled Nursing Facility Living arrangements for the past 2 months: Single Family Home                                       Social Determinants of Health (SDOH) Interventions SDOH Screenings   Food Insecurity: No Food Insecurity (04/12/2024)  Housing: Low Risk  (04/12/2024)  Transportation Needs: No Transportation Needs (04/12/2024)  Utilities: Not At Risk (04/12/2024)  Social Connections: Socially Integrated (04/10/2024)  Tobacco Use: Medium Risk (04/10/2024)    Readmission Risk Interventions    04/12/2024    4:58 PM  Readmission Risk Prevention Plan  Transportation Screening Complete  PCP or Specialist Appt within 3-5 Days Complete  HRI or Home Care Consult Complete  Social Work Consult for Recovery Care Planning/Counseling Complete  Palliative Care Screening Not Applicable  Medication Review Oceanographer) Complete

## 2024-05-04 NOTE — Progress Notes (Signed)
 NAME:  Kristopher Thompson, MRN:  [id number], DOB:  15-Mar-1951, LOS: 24 ADMISSION DATE:  04/10/2024, CONSULTATION DATE:  04/13/2024 REFERRING MD:  Dr Celinda, CHIEF COMPLAINT: Sepsis  History of Present Illness:  S/p repair of incisional/parastomal and ventral hernias, left inguinal incarcerated , abdominal hernias, lysis of adhesions, urostomy revision  Transferred to the ICU secondary to tachycardia, tachypnea  History of bladder cancer, prostate cancer, diastolic heart failure, diabetes, right bundle branch block, multiple abdominal surgeries  Update 04/20/24: ccm reconsulted post operatively after pt had perforated bowel and returned to OR. Pt was requiring neo and norepi. He remains intubated at this time. Profoundly acidotic with bicarb of 12.  Started on sodium bicarb infusion and bolus prior to initiation. Follow labs in am. Updated wife and son at bedside.   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   moderate atypia - right low back   Atypical nevus 04/04/2007   moderate to marked - right upper back (wider shave)   Atypical nevus 04/04/2007   moderate atypia - center chest (wider shave)   Atypical nevus 04/04/2007   slight atypia - right thigh   Atypical nevus 11/29/2011   mild atypia - center upper back   Atypical nevus 11/29/2011   mild atypia - center chest   Bacteremia due to Klebsiella pneumoniae 10/09/2017   Bladder cancer (HCC) dx 07/2017   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 denies   Sleep apnea    cpap   Squamous cell carcinoma of skin 05/22/2013   left cheek - CX3 + 5FU   Wears glasses    Significant Hospital Events: Including procedures, antibiotic start and stop dates in addition to other pertinent events   04/10/2024-laparoscopic surgery 04/13/24 CCM consult. Incr NE. Art line  6/3 weaning NE. Gently diuresed w 3L off  6/4 off NE. Pulled out his NGT refused replacement. After multiple emesis  6/5 cont NGT 6/6 tx to med surg 6/9: taken back to OR for perforated bowel, post-op shock 6/10: Norepinephrine , Phenylephrine , and vasopressin , added steroids, 1u PRBCs, back to OR for washout and closure 6/11: Worsening AKI with fluid overload, start CRRT, decreasing pressor requirements 6/13 tolerating CRRT 6/14: Off CRRT, arousable.  Tolerated weaning for most of the day 6/13 6/14: Extubated, CRRT stopped. 6/15 transferred to [facility] for iHD; montoring for renal recovery 6/16 iHD, severely encephalopathic with high BUN 6/17 restarted CRRT for to ongoing uremia and encephalopathy 6/18 was awake enough to work with PT, OT  Interim History / Subjective:  Off pressors. Tachy and vasovagal during HD. CT with persistent hydronephrosis and surgical changes. Plan perc neph tubes.  Objective    Blood pressure 107/77, pulse (!) 115, temperature  97.6 F (36.4 C), temperature source Oral, resp. rate 18, height 5' 11 (1.803 m), weight 100.2 kg, SpO2 100%.        Intake/Output Summary (Last 24 hours) at 05/04/2024 0936 Last data filed at 05/04/2024 0800 Gross per 24 hour  Intake 2956.24 ml  Output 4735 ml  Net -1778.76 ml   Filed Weights   05/02/24 0500 05/03/24 0454 05/04/24 0500  Weight: 105.8 kg 105.8 kg 100.2 kg    Examination: General:chronically ill, obese, resting  comfortably HEENT: cortrak in place, dry mucus membranes Neuro: awakens to voice, trackes, follows commands, lethargic but oriented Chest: non labored, diminished bilaterally, no wheeze Heart: Regular rate and rhythm, no murmurs or gallops Abdomen: wound vac in place with abdominal drains - JP drains with clear yellow output  Labs and images reviewed JP drain with elevated Creatinine and copious output.  UOP almost nil.   Resolved problem list  Septic shock Acute respiratory failure with hypoxia  Assessment and Plan  Robotic hernia repair-- incisional, parastomal, left inguinal Septic shock due to acute peritonitis caused by bowel perforation - OR 6/9, back to the OR 6/10 for closure and ileal resection and end ileostomy creation General Surgery is Primary  Continue prolonged course of antibiotic therapy discussed between general surgery and ID Continue tube feeds 14 hours/day Encourage oral intake so we can stop tube feeds Wound VAC management per general surgery Concern for compromised ileal conduit. Discussed with nephrology, urology and general surgery.   Acute metabolic encephalopathy due to renal failure, uremia, ICU delirium Mental status has improved Avoid sedation  Acute kidney injury due to ischemic ATN from shock as well as what appears urinary obstruction/bilateral hydronephrosis Acute metabolic acidosis due to renal failure hyperphosphatemia Nephrology, urology both following, iHD 6/23 complicated by tachycardia and AMS transient likely vasovagel - suspect hypovolemia overall No urgent RRT needs, monitoring UOP and labs Going or neph tubes plan 6-23  Diabetes type 2 Blood sugars are controlled Continue insulin  with CBG goal 140-180  Acute blood loss anemia Right flank hematoma History of PE, was on Eliquis at home Hemoglobin stable Keep on heparin  for now given potential anticipated procedures.   Chronic HFpEF Looks euvolemic to hypovolemic, GDMT as  tolerated  Moderate protein calorie malnutrition Obesity Continue dietary supplements Diet Counseling provided Continue full liquid diet  Deconditioning PT/OT evaluation  Best Practice (right click and Reselect all SmartList Selections daily)   Diet/type: tubefeeds dysphagia diet as tolerated DVT prophylaxis systemic heparin   Pressure ulcer(s): per nursing assessment GI prophylaxis: N/A Lines: Central line, PICC art line  Foley:  N/A urostomy  Code Status:  full code Last date of multidisciplinary goals of care discussion: 6/22 spouse updated  at bedside    I spent 52 minutes in total visit time for this patient, with more than 50% spent counseling/coordinating care.  Verdon Gore, MD Pulmonary and Critical Care Medicine [facility] 05/04/2024 9:36 AM Pager: see AMION  If no response to pager, please call critical care on call (see AMION) until 7pm After 7:00 pm call Elink

## 2024-05-04 NOTE — Progress Notes (Signed)
 PT Cancellation Note  Patient Details Name: Kristopher Thompson MRN: [id number] DOB: 1951/05/13   Cancelled Treatment:    Reason Eval/Treat Not Completed: Patient at procedure or test/unavailable (pt in HD and per RN scheduled for procedure after)   [name] 05/04/2024, 7:57 AM [name], PT Acute Rehabilitation Services Office: [phone]

## 2024-05-04 NOTE — Plan of Care (Signed)
 15 min into dialysis pt HR increased from low 100s to 120s-130s, upon checking on pt, his eyes were fixed and his gazed to the left. Pt was not arousal and this lasted 2-3 min. TF held and to restart at 1800. Heparin  held and to be restarted at 2100. Pt went to IR and bilateral nephrostomy tubes placed. Problem: Education: Goal: Knowledge of General Education information will improve Description: Including pain rating scale, medication(s)/side effects and non-pharmacologic comfort measures Outcome: Progressing   Problem: Health Behavior/Discharge Planning: Goal: Ability to manage health-related needs will improve Outcome: Progressing   Problem: Clinical Measurements: Goal: Ability to maintain clinical measurements within normal limits will improve Outcome: Progressing Goal: Will remain free from infection Outcome: Progressing Goal: Diagnostic test results will improve Outcome: Progressing Goal: Respiratory complications will improve Outcome: Progressing Goal: Cardiovascular complication will be avoided Outcome: Progressing   Problem: Activity: Goal: Risk for activity intolerance will decrease Outcome: Progressing   Problem: Nutrition: Goal: Adequate nutrition will be maintained Outcome: Progressing   Problem: Elimination: Goal: Will not experience complications related to bowel motility Outcome: Progressing Goal: Will not experience complications related to urinary retention Outcome: Progressing   Problem: Pain Managment: Goal: General experience of comfort will improve and/or be controlled Outcome: Progressing   Problem: Safety: Goal: Ability to remain free from injury will improve Outcome: Progressing   Problem: Skin Integrity: Goal: Risk for impaired skin integrity will decrease Outcome: Progressing   Problem: Education: Goal: Ability to describe self-care measures that may prevent or decrease complications (Diabetes Survival Skills Education) will  improve Outcome: Progressing Goal: Individualized Educational Video(s) Outcome: Progressing   Problem: Coping: Goal: Ability to adjust to condition or change in health will improve Outcome: Progressing   Problem: Fluid Volume: Goal: Ability to maintain a balanced intake and output will improve Outcome: Progressing   Problem: Health Behavior/Discharge Planning: Goal: Ability to identify and utilize available resources and services will improve Outcome: Progressing Goal: Ability to manage health-related needs will improve Outcome: Progressing   Problem: Metabolic: Goal: Ability to maintain appropriate glucose levels will improve Outcome: Progressing   Problem: Nutritional: Goal: Maintenance of adequate nutrition will improve Outcome: Progressing Goal: Progress toward achieving an optimal weight will improve Outcome: Progressing   Problem: Skin Integrity: Goal: Risk for impaired skin integrity will decrease Outcome: Progressing   Problem: Tissue Perfusion: Goal: Adequacy of tissue perfusion will improve Outcome: Progressing   Problem: Fluid Volume: Goal: Hemodynamic stability will improve Outcome: Progressing   Problem: Clinical Measurements: Goal: Diagnostic test results will improve Outcome: Progressing Goal: Signs and symptoms of infection will decrease Outcome: Progressing   Problem: Respiratory: Goal: Ability to maintain adequate ventilation will improve Outcome: Progressing

## 2024-05-04 NOTE — Consult Note (Addendum)
 Chief Complaint: Hydronephrosis, urine leak; request for bilateral diverting PCNs  Referring Provider(s): Dr. Nieves  Supervising Physician: Jennefer Rover  Patient Status: [facility] - In-pt  History of Present Illness: Kristopher Thompson is a 73 y.o. male with past medical history significant for hypertension, CHF, s/p robotic prostatectomy, bladder cancer (radical cystectomy, urostomy ileal conduit, revision 04/10/2024 with Dr. Sheldon), acute bowel perforation (s/p bowel repair 04/19/2024, lysis of adhesion, ileal resection end ileostomy hernia repairs), currently admitted for septic shock with acute kidney injury and oliguria.  He required CRRT 6/11 through 6/14.  However subsequently creatinine and BUN began rising and he was put back on CRRT 6/17 through 6/20.  Urine output, drain output and JP creatinine consistent with urine leak.  Urology was consulted and decision was made for CT with IV contrast and delayed imaging to help assess size and location of the leak and determine if there is still hydronephrosis. Bilateral hydronephrosis was noted on 05/03/2024 CT AP (persistent severe left hydroureteronephrosis, moderate right hydroureteronephrosis).  IR consulted for bilateral diverting nephrostomy tubes.  Has been experiencing altered mental status during hospitalization. Interview and consent performed with his spouse Alvy Alsop. He is alert but only oriented to self. Minimally participatory in exam and ROS. Currently receiving dialysis treatment via L internal jugular temp cath.   Tube feedings paused since 6am.   Heparin  drip paused 0800.  Wears home CPAP, while admitted has been wearing 3 L O2 [place].   Allergies Reviewed:  Demerol  [meperidine ]    Patient is Full Code  Past Medical History:  Diagnosis Date   At risk for sleep apnea    12-25-2017   STOP-BANG SCORE= 5   --- SENT TO PCP   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   moderate atypia - right low back   Atypical nevus  04/04/2007   moderate to marked - right upper back (wider shave)   Atypical nevus 04/04/2007   moderate atypia - center chest (wider shave)   Atypical nevus 04/04/2007   slight atypia - right thigh   Atypical nevus 11/29/2011   mild atypia - center upper back   Atypical nevus 11/29/2011   mild atypia - center chest   Bacteremia due to Klebsiella pneumoniae 10/09/2017   Bladder cancer (HCC) dx 07/2017   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 denies   Sleep apnea    cpap   Squamous cell carcinoma of skin 05/22/2013   left cheek - CX3 + 5FU   Wears glasses     Past Surgical History:  Procedure Laterality Date   ABDOMINAL SURGERY     APPENDECTOMY  1972   BOWEL RESECTION N/A 04/20/2024   Procedure: SMALL BOWEL RESECTION;  Surgeon: [name], MD;  Location: [facility];  Service: General;  Laterality: N/A;   CHOLECYSTECTOMY  1985   COLONOSCOPY N/A 06/29/2021   Procedure: COLONOSCOPY;  Surgeon: Debby Hila,  MD;  Location: [facility];  Service: Endoscopy;  Laterality: N/A;   COLOSTOMY REVERSAL N/A 01/08/2018   Procedure: COLOSTOMY REVERSAL;  Surgeon: Debby Hila, MD;  Location: [facility];  Service: General;  Laterality: N/A;   CYSTOSCOPY WITH RETROGRADE PYELOGRAM, URETEROSCOPY AND STENT PLACEMENT Right 06/10/2017   Procedure: CYSTOSCOPY WITH RIGHT URETEROSCOPY WITH RIGHT STENT PLACEMENT;  Surgeon: [name], MD;  Location: [facility];  Service:  Urology;  Laterality: Right;   EUS N/A 04/16/2018   Procedure: FULL UPPER ENDOSCOPIC ULTRASOUND (EUS) RADIAL;  Surgeon: Burnette Fallow, MD;  [facility]: [facility];  Service: Endoscopy;  Laterality: N/A;   FLEXIBLE SIGMOIDOSCOPY N/A 12/13/2017   Procedure: FLEXIBLE SIGMOIDOSCOPY;  Surgeon: Debby Hila, MD;  [facility];  Service: Endoscopy;  Laterality: N/A;   ILEO CONDUIT     IR CATHETER TUBE CHANGE  12/13/2017   IR CATHETER TUBE CHANGE  01/17/2018   IR CATHETER TUBE CHANGE  02/28/2018   IR CATHETER TUBE CHANGE  07/18/2018   IR CATHETER TUBE CHANGE  08/22/2018   IR CATHETER TUBE CHANGE  10/31/2018   IR CONVERT LEFT NEPHROSTOMY TO NEPHROURETERAL CATH  10/24/2017   IR EXT NEPHROURETERAL CATH EXCHANGE  05/19/2018   IR EXT NEPHROURETERAL CATH EXCHANGE  06/16/2018   IR NEPHRO TUBE REMOV/FL  10/24/2017   IR NEPHROSTOGRAM LEFT THRU EXISTING ACCESS  12/05/2018   IR NEPHROSTOMY PLACEMENT LEFT  10/07/2017   IR NEPHROSTOMY TUBE CHANGE  04/11/2018   LAPAROTOMY N/A 04/20/2024   Procedure: EXPLORATORY LAPAROTOMY;  Surgeon: [name], MD;  Location: [facility];  Service: General;  Laterality: N/A;   PARTIAL COLECTOMY N/A 04/21/2024   Procedure: COLECTOMY, PARTIAL;  Surgeon: [name], MD;  Location: [facility];  Service: General;  Laterality: N/A;  Removal Wound Vac, Washout Ostomy, Possible Anastomosis, Possible Ileostomy.  Phasix Mesh.   POLYPECTOMY  06/29/2021   Procedure: POLYPECTOMY;  Surgeon: Debby Hila, MD;  [facility];  Service: Endoscopy;;   ROBOT ASSISTED LAPAROSCOPIC RADICAL PROSTATECTOMY  10/02/2012   Procedure: ROBOTIC ASSISTED LAPAROSCOPIC RADICAL PROSTATECTOMY LEVEL 2;  Surgeon: Noretta Renda, MD;  Location: [facility];  Service: Urology;  Laterality: N/A;   ROBOTIC ASSISTED LAPAROSCOPIC BLADDER DIVERTICULECTOMY N/A 08/08/2017   Procedure: XI ROBOTIC ASSISTED LAPAROSCOPIC RADICAL CYSTECTOMY COVERTED TO OPEN PELVIC LYMPHADNECTOMY BILATERAL AND ILEAL CONDUIT URINARY DIVERSION;  Surgeon:  [name], MD;  Location: [facility];  Service: Urology;  Laterality: N/A;   TRANSURETHRAL RESECTION OF BLADDER TUMOR  06/10/2017   Procedure: TRANSURETHRAL RESECTION OF BLADDER TUMOR (TURBT);  Surgeon: [name], MD;  Location: [facility];  Service: Urology;;   VACUUM ASSISTED CLOSURE CHANGE N/A 04/20/2024   Procedure: PLACEMENT OF CONCETTA CAPES;  Surgeon: [name], MD;  Location: [facility];  Service: General;  Laterality: N/A;   VENTRAL HERNIA REPAIR N/A 04/10/2024   Procedure: REPAIR, HERNIA, VENTRAL;  Surgeon: [name], MD;  Location: [facility];  Service: General;  Laterality: N/A;   XI ROBOT ABDOMINAL PERINEAL RESECTION N/A 08/08/2017   Procedure: REPAIR OF RECTAL TEAR POSSIBLE PARTIAL PROCTECTOMY, CREATION OF  OSTOMY;  Surgeon: Debby Hila, MD;  Location: [facility];  Service: General;  Laterality: N/A;   XI ROBOTIC ASSISTED PARASTOMAL HERNIA REPAIR N/A 04/10/2024   Procedure: REPAIR, HERNIA, PARASTOMAL AND VENTRAL HERNIAS, ROBOT-ASSISTED, LEFT INGUINAL HERNIA, LYSIS OF ADHESIONS INCARCERATED AND INSIONAL HERNIAS AND UROSTOMY REVISION;  Surgeon: [name], MD;  Location: [facility];  Service: General;  Laterality: N/A;      Medications: Prior to Admission medications   Medication Sig  Start Date End Date Taking? Authorizing Provider  acetaminophen  (TYLENOL ) 500 MG tablet Take 1,000 mg by mouth every 8 (eight) hours as needed for mild pain or headache.    Yes [provider]  bisacodyl  (DULCOLAX) 5 MG EC tablet Take 2 tablets (10 mg total) by mouth daily as needed for moderate constipation. Patient taking differently: Take 15 mg by mouth daily. 12/09/18  Yes Sebastian Toribio GAILS, MD  Cyanocobalamin  (VITAMIN B-12 PO) Place 1 tablet under the tongue 3 (three) times a week.   Yes [provider]  cyclobenzaprine  (FLEXERIL ) 5 MG tablet Take 1 tablet (5 mg total) by mouth 3 (three) times daily as needed for muscle spasms. 04/10/24  Yes [name], MD  D-MANNOSE PO Take 2-3 tablets  by mouth See admin instructions. Take 2 tablets by mouth every morning & take 1 tablet by mouth at bedtime.   Yes [provider]  diphenhydrAMINE  (BENADRYL ) 25 mg capsule Take 25 mg by mouth every other day as needed (congestion/allergies (every other night)).   Yes [provider]  ELIQUIS 5 MG TABS tablet Take 5 mg by mouth 2 (two) times daily.   Yes [provider]  ipratropium (ATROVENT ) 0.03 % nasal spray Place 1 spray into both nostrils 3 (three) times daily as needed (congestion).   Yes [provider]  metFORMIN (GLUCOPHAGE) 500 MG tablet Take 500 mg by mouth in the morning. 01/29/24  Yes [provider]  Multiple Vitamins-Minerals (CENTRUM MINIS ADULTS 50+) TABS Take 1 tablet by mouth See admin instructions. Take 1 tablet by mouth twice daily on every other day.   Yes [provider]  Omega-3 Fatty Acids (FISH OIL PO) Take 1 capsule by mouth 3 (three) times a week.   Yes [provider]  oxyCODONE -acetaminophen  (PERCOCET) 5-325 MG tablet Take 1 tablet by mouth every 6 (six) hours as needed for severe pain (pain score 7-10). Can increase to 2 pills at a time if needed 04/10/24  Yes Gross, Standing, MD  polyethylene glycol (MIRALAX ) 17 g packet Take 17 g by mouth daily as needed (constipation.).   Yes [provider]  rosuvastatin  (CRESTOR ) 10 MG tablet Take 10 mg by mouth every evening.   Yes [provider]  triamcinolone  cream (KENALOG ) 0.1 % Apply 1 application topically daily as needed. 10/17/21  Yes [name], MD     Family History  Problem Relation Age of Onset   Lung cancer Mother    Hypertension Father    Colon cancer Other    CAD Neg Hx    Diabetes Neg Hx    Stroke Neg Hx     Social History   Socioeconomic History   Marital status: Married    Spouse name: Not on file   Number of children: Not on file   Years of education: Not on file   Highest education level: Not on file  Occupational  History   Not on file  Tobacco Use   Smoking status: Former    Current packs/day: 0.00    Types: Cigarettes    Start date: 12/24/1970    Quit date: 12/25/1979    Years since quitting: 44.3   Smokeless tobacco: Never  Vaping Use   Vaping status: Never Used  Substance and Sexual Activity   Alcohol  use: Yes    Comment: rare   Drug use: No   Sexual activity: Yes  Other Topics Concern   Not on file  Social History Narrative   Not  on file   Social Drivers of Health   Financial Resource Strain: Not on file  Food Insecurity: No Food Insecurity (04/12/2024)   Hunger Vital Sign    Worried About Running Out of Food in the Last Year: Never true    Ran Out of Food in the Last Year: Never true  Transportation Needs: No Transportation Needs (04/12/2024)   PRAPARE - Administrator, Civil Service (Medical): No    Lack of Transportation (Non-Medical): No  Physical Activity: Not on file  Stress: Not on file  Social Connections: Socially Integrated (04/10/2024)   Social Connection and Isolation Panel    Frequency of Communication with Friends and Family: More than three times a week    Frequency of Social Gatherings with Friends and Family: Three times a week    Attends Religious Services: More than 4 times per year    Active Member of Clubs or Organizations: Yes    Attends Banker Meetings: 1 to 4 times per year    Marital Status: Married     Review of Systems: Patient unable to participate in ROS questions d/t AMS  Vital Signs: BP 115/76   Pulse (!) 119   Temp 97.6 F (36.4 C) (Oral)   Resp 18   Ht 5' 11 (1.803 m)   Wt 220 lb 14.4 oz (100.2 kg)   SpO2 100%   BMI 30.81 kg/m   Advance Care Plan: No documents on file    Physical Exam HENT:     Nose:     Comments: NG tube present    Mouth/Throat:     Mouth: Mucous membranes are moist.     Pharynx: Oropharynx is clear.   Cardiovascular:     Rate and Rhythm: Regular rhythm. Tachycardia present.      Pulses: Normal pulses.     Heart sounds: Normal heart sounds.  Pulmonary:     Effort: Pulmonary effort is normal.     Breath sounds: Normal breath sounds.  Abdominal:     Palpations: Abdomen is soft.     Tenderness: There is no abdominal tenderness.     Comments: R abd upper and lower ostomies present. Upper bag contains stool and yellow liquid. Midline wound vac present, well dressed. Bilateral lower abd JP drains present, contain pale pink fluid.     Musculoskeletal:     Comments: RUE PICC, L internal jugular temp cath   Skin:    General: Skin is warm and dry.   Neurological:     Mental Status: He is alert.     Comments: Alert, oriented to self  Psychiatric:        Mood and Affect: Mood normal.        Behavior: Behavior normal.        Thought Content: Thought content normal.        Judgment: Judgment normal.     Imaging: CT ABDOMEN PELVIS W CONTRAST Result Date: 05/03/2024 CLINICAL DATA:  Bladder dysfunction (Ped 0-17y). 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 EXAM: CT ABDOMEN AND PELVIS WITH CONTRAST TECHNIQUE: Multidetector CT imaging of the abdomen and pelvis was performed using the standard protocol following bolus administration of intravenous contrast. RADIATION DOSE REDUCTION: This exam was performed according to the departmental dose-optimization program which includes automated exposure control, adjustment of the mA and/or kV according to patient size and/or use of iterative reconstruction technique. CONTRAST:  75mL OMNIPAQUE  IOHEXOL  350 MG/ML SOLN COMPARISON:  CT abdomen 04/19/2024  FINDINGS: Lower chest: Coronary artery calcification.  No acute abnormality. Hepatobiliary: No focal liver abnormality. Status post cholecystectomy. No biliary dilatation. Pancreas: Coarse calcifications throughout the parenchyma. No focal lesion. Normal pancreatic contour. No surrounding inflammatory changes. No main pancreatic ductal dilatation. Spleen: Normal in size  without focal abnormality. Adrenals/Urinary Tract: No adrenal nodule bilaterally. Bilateral kidneys enhance symmetrically. Persistent severe left hydroureteronephrosis and moderate right hydroureteronephrosis. Renal parenchymal thinning and scarring on the left. Status post cystectomy. Right lower abdominal wall ileal conduit formation (3:81). Stomach/Bowel: Right abdominal lower anterior abdominal wall and ileostomy formation. PO contrast opacifies the large bowel. Enteric tube with tip and side port within the gastric lumen. Stomach is within normal limits. No evidence of bowel wall thickening or dilatation. Colonic diverticulosis. Appendix appears normal. Vascular/Lymphatic: No abdominal aorta or iliac aneurysm. Moderate atherosclerotic plaque of the aorta and its branches. No abdominal, pelvic, or inguinal lymphadenopathy. Reproductive: Prostatectomy. Other: Bilateral lower abdominal wall surgical drains again noted with tips terminating within the right lower quadrant. Interval development of an organized fluid collection measuring 4.3 x 3.6 cm within the right anterolateral abdominal musculature with finding that appears to be contiguous with the peritoneum (3:46). Associated free fluid along the right lateral anterior abdomen that appears to possibly be originating from the ileum where ileal bowel sutures are noted within the right abdomen (3:68). Question fistulization and dehiscence of the ileal bowel sutures with the right abdomen of soft tissues (3:69). Associated free fluid along the right lateral anterior abdomen with extension into the soft tissue and fistulization not excluded between the abscess and the dermis (3:60). No free gas. Musculoskeletal: Possible developing supraumbilical tiny ventral hernia containing fluid. No suspicious lytic or blastic osseous lesions. No acute displaced fracture. IMPRESSION: 1. Interval development of an organized fluid collection measuring 4.3 x 3.6 cm within the  right anterolateral abdominal musculature with finding that appears to be contiguous with the peritoneum. Extensive inflammatory changes along the abdomen and abdominal soft tissues on the right. Question fistulization and dehiscence of the ileal bowel sutures with the right abdomen of soft tissues. 2. Ileal conduit appears to course adjacent the inflammatory changes with no urothelial thickening to suggest superimposed renal infection. 3. Possible developing supraumbilical tiny ventral hernia containing fluid. 4. Chronic pancreatitis with no findings of acute pancreatitis. 5. Colonic diverticulosis with no acute diverticulitis. 6. 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 7. Persistent severe left hydroureteronephrosis and moderate right hydroureteronephrosis. 8.  Aortic Atherosclerosis (ICD10-I70.0). Electronically Signed   By: Morgane  Naveau M.D.   On: 05/03/2024 16:42   DG Abd Portable 1V Result Date: 04/27/2024 CLINICAL DATA:  Feeding tube placement EXAM: PORTABLE ABDOMEN - 1 VIEW COMPARISON:  04/15/2024 FINDINGS: Frontal view of the lower chest and upper abdomen demonstrates enteric catheter tip projecting over the gastric body. Bowel gas pattern is unremarkable. No acute bony abnormalities. IMPRESSION: 1. Enteric catheter tip projecting over the gastric body. Electronically Signed   By: Ozell Daring M.D.   On: 04/27/2024 15:11   DG Chest Port 1 View Result Date: 04/22/2024 CLINICAL DATA:  Central line placement. EXAM: PORTABLE CHEST 1 VIEW COMPARISON:  04/20/2024. FINDINGS: Left IJ hemodialysis CVC catheter tip overlies the lower SVC. Right PICC tip overlies the superior cavoatrial junction. Endotracheal tube tip is located proximally 4 cm above the carina. Enteric tube tip and side port within the stomach. Low lung volumes with unchanged cardiomediastinal silhouette. No pneumothorax. Left basilar opacity may reflect atelectasis or  infiltrate. Mild right basilar atelectasis. IMPRESSION: 1. Left IJ hemodialysis CVC catheter tip overlies the lower SVC. 2. Additional life-support apparatus, as above. 3. Low lung volumes with left basilar opacity, which may reflect atelectasis or infiltrate. Mild right basilar atelectasis. Electronically Signed   By: Harrietta Sherry M.D.   On: 04/22/2024 12:25   DG CHEST PORT 1 VIEW Result Date: 04/20/2024 CLINICAL DATA:  Check endotracheal tube placement EXAM: PORTABLE CHEST 1 VIEW COMPARISON:  Film from the previous day. FINDINGS: Tracheal tube is noted 3.6 cm above the carina. Right PICC is seen with the catheter tip at the cavoatrial junction stable from the prior exam. Gastric catheter extends into the stomach. Overall inspiratory effort is poor although no focal confluent infiltrate is seen. IMPRESSION: Tubes and lines as described.  No acute abnormality noted. Electronically Signed   By: Oneil Devonshire M.D.   On: 04/20/2024 03:26   DG Chest Port 1 View Result Date: 04/19/2024 CLINICAL DATA:  Respiratory distress EXAM: PORTABLE CHEST 1 VIEW COMPARISON:  04/15/2024 FINDINGS: Right PICC tip in the right atrium. Subdiaphragmatic enteric tube. Stable cardiomediastinal silhouette. Bibasilar atelectasis. Otherwise no focal consolidation. No pleural effusion or pneumothorax. No displaced rib fractures. IMPRESSION: Bibasilar atelectasis. Electronically Signed   By: Norman Gatlin M.D.   On: 04/19/2024 22:49   CT ABDOMEN PELVIS WO CONTRAST Result Date: 04/19/2024 CLINICAL DATA:  Postoperative abdominal pain. EXAM: CT ABDOMEN AND PELVIS WITHOUT CONTRAST TECHNIQUE: Multidetector CT imaging of the abdomen and pelvis was performed following the standard protocol without IV contrast. RADIATION DOSE REDUCTION: This exam was performed according to the departmental dose-optimization program which includes automated exposure control, adjustment of the mA and/or kV according to patient size and/or use of iterative  reconstruction technique. COMPARISON:  CT abdomen pelvis dated 04/14/2024. FINDINGS: Evaluation of this exam is limited in the absence of intravenous contrast. Lower chest: Trace right pleural effusion. There are bibasilar subsegmental atelectasis. There is coronary vascular calcification. Partially visualized tip of central venous line at the cavoatrial junction. Small pockets of pneumoperitoneum.  No significant free fluid. Hepatobiliary: The liver is grossly unremarkable. No biliary dilatation. Cholecystectomy. Pancreas: Scattered pancreatic calcification sequela of chronic pancreatitis. No active inflammatory changes. Spleen: Normal in size without focal abnormality. Adrenals/Urinary Tract: The adrenal glands unremarkable. Similar appearance of moderate right and severe left hydronephrosis with moderate left renal parenchyma atrophy. There is bilateral hydroureter. Postsurgical changes of cystectomy with a right lower quadrant ileal conduit. No stone. Stomach/Bowel: Enteric tube with tip in the body of the stomach. Multiple dilated and contrast filled small bowel loops measure up to approximately 5 cm in the left lower abdomen. No discrete transition noted. Findings favor to represent reactive ileus. Contrast noted in the colon. There is sigmoid diverticulosis. The appendix is not visualized with certainty. No inflammatory changes identified in the right lower quadrant. Vascular/Lymphatic: Mild aortoiliac atherosclerotic disease. The IVC is unremarkable. No portal venous gas. There is no adenopathy. Reproductive: Prostatectomy. Other: Postsurgical changes of anterior abdominal wall. There is air-fluid collection in the subcutaneous soft tissues of the right anterior pelvic wall extending from the midline to the ileostomy measuring approximately 5 x 12 cm. A drainage catheter noted within this collection. Additional drainage catheter noted in the pelvis. Musculoskeletal: Degenerative changes of the spine. No  acute osseous pathology. IMPRESSION: 1. Postsurgical changes of cystectomy with a right lower quadrant ileal conduit. 2. Air-fluid collection in the subcutaneous soft tissues of the right anterior pelvic wall extending from the midline to the ileostomy.  A drainage catheter noted within this collection. 3. Multiple dilated and contrast filled small bowel loops favor to represent reactive ileus. 4. Sigmoid diverticulosis. 5. Similar appearance of moderate right and severe left hydronephrosis with moderate left renal parenchyma atrophy. 6. Additional postoperative changes and related small pneumoperitoneum similar to prior CT. 7.  Aortic Atherosclerosis (ICD10-I70.0). Electronically Signed   By: Vanetta Chou M.D.   On: 04/19/2024 12:09   CT HEAD WO CONTRAST ( ) Result Date: 04/16/2024 CLINICAL DATA:  Delirium EXAM: CT HEAD WITHOUT CONTRAST TECHNIQUE: Contiguous axial images were obtained from the base of the skull through the vertex without intravenous contrast. RADIATION DOSE REDUCTION: This exam was performed according to the departmental dose-optimization program which includes automated exposure control, adjustment of the mA and/or kV according to patient size and/or use of iterative reconstruction technique. COMPARISON:  None Available. FINDINGS: Brain: No acute intracranial hemorrhage. No CT evidence of acute infarct. No edema, mass effect, or midline shift. The basilar cisterns are patent. Mild parenchymal volume loss. Ventricles: The ventricles are normal. Vascular: No hyperdense vessel or unexpected calcification. Skull: No acute or aggressive finding. Orbits: Orbits are symmetric. Sinuses: Mucosal thickening in the ethmoid and maxillary sinuses. No air-fluid levels. Other: Mastoid air cells are clear. Partially visualized nasoenteric tube. IMPRESSION: No CT evidence of acute intracranial abnormality. Electronically Signed   By: Donnice Mania M.D.   On: 04/16/2024 08:09   DG Abd 1 View Result Date:  04/15/2024 CLINICAL DATA:  NG tube placement EXAM: ABDOMEN - 1 VIEW COMPARISON:  04/13/2024 FINDINGS: Limited field of view for tube placement verification purposes. An enteric tube is present with tip projecting over the left upper quadrant consistent with location in the body of the stomach. Surgical clips in the right upper quadrant. Visualized bowel gas pattern is normal. Linear atelectasis in the lung bases. IMPRESSION: Enteric tube tip projects over the left upper quadrant consistent with location in the body of the stomach. Electronically Signed   By: Elsie Gravely M.D.   On: 04/15/2024 17:57   DG CHEST PORT 1 VIEW Result Date: 04/15/2024 CLINICAL DATA:  [id number] Pleural effusion [id number] EXAM: PORTABLE CHEST - 1 VIEW COMPARISON:  04/13/2024 FINDINGS: 5 right arm PICC to the cavoatrial junction. Possibly low lung volumes with some increase in perihilar patchy opacities. Heart size and mediastinal contours are within normal limits. No definite effusion. Visualized bones unremarkable. IMPRESSION: Low lung volumes with some increase in perihilar patchy opacities. Electronically Signed   By: JONETTA Faes M.D.   On: 04/15/2024 08:01   ECHOCARDIOGRAM COMPLETE Result Date: 04/14/2024    ECHOCARDIOGRAM REPORT   Patient Name:   CRISTAL HOWATT Date of Exam: 04/14/2024 Medical Rec #:  [id number]          Height:       71.0 in Accession #:    [id number]         Weight:       240.0 lb Date of Birth:  July 23, 1951          BSA:          2.278 m Patient Age:    72 years           BP:           122/56 mmHg Patient Gender: M                  HR:           110 bpm. Exam Location:  Inpatient Procedure: 2D  Echo, Intracardiac Opacification Agent, Cardiac Doppler and Color            Doppler (Both Spectral and Color Flow Doppler were utilized during            procedure). Indications:    Abnormal ECG R94.31  History:        Patient has no prior history of Echocardiogram examinations.                 Abnormal ECG,  Arrythmias:Tachycardia; Risk Factors:Sleep Apnea                 and Former Smoker.  Sonographer:    Koleen Popper RDCS Referring Phys: [id number] DAVID MANUEL ORTIZ  Sonographer Comments: Technically challenging study due to limited acoustic windows. Image acquisition challenging due to uncooperative patient and Image acquisition challenging due to respiratory motion. IMPRESSIONS  1. Left ventricular ejection fraction, by estimation, is 65 to 70%. The left ventricle has hyperdynamic function. The left ventricle has no regional wall motion abnormalities. Left ventricular diastolic parameters are consistent with Grade I diastolic dysfunction (impaired relaxation).  2. Right ventricular systolic function is normal. The right ventricular size is not well visualized. There is normal pulmonary artery systolic pressure. The estimated right ventricular systolic pressure is 29.4 mmHg.  3. Left atrial size was mildly dilated.  4. The mitral valve is grossly normal. No evidence of mitral valve regurgitation. No evidence of mitral stenosis.  5. The aortic valve is tricuspid. There is mild calcification of the aortic valve. There is mild thickening of the aortic valve. Aortic valve regurgitation is not visualized. Aortic valve sclerosis/calcification is present, without any evidence of aortic stenosis. FINDINGS  Left Ventricle: Left ventricular ejection fraction, by estimation, is 65 to 70%. The left ventricle has hyperdynamic function. The left ventricle has no regional wall motion abnormalities. Definity  contrast agent was given IV to delineate the left ventricular endocardial borders. The left ventricular internal cavity size was normal in size. There is borderline concentric left ventricular hypertrophy. Left ventricular diastolic parameters are consistent with Grade I diastolic dysfunction (impaired relaxation). Normal left ventricular filling pressure. Right Ventricle: The right ventricular size is not well visualized.  Right vetricular wall thickness was not well visualized. Right ventricular systolic function is normal. There is normal pulmonary artery systolic pressure. The tricuspid regurgitant velocity is 2.47 m/s, and with an assumed right atrial pressure of 5 mmHg, the estimated right ventricular systolic pressure is 29.4 mmHg. Left Atrium: Left atrial size was mildly dilated. Right Atrium: Right atrial size was not well visualized. Pericardium: There is no evidence of pericardial effusion. Mitral Valve: The mitral valve is grossly normal. No evidence of mitral valve regurgitation. No evidence of mitral valve stenosis. Tricuspid Valve: The tricuspid valve is grossly normal. Tricuspid valve regurgitation is mild. Aortic Valve: The aortic valve is tricuspid. There is mild calcification of the aortic valve. There is mild thickening of the aortic valve. Aortic valve regurgitation is not visualized. Aortic valve sclerosis/calcification is present, without any evidence of aortic stenosis. Pulmonic Valve: The pulmonic valve was not well visualized. Pulmonic valve regurgitation is not visualized. No evidence of pulmonic stenosis. Aorta: The aortic root and ascending aorta are structurally normal, with no evidence of dilitation. Venous: The inferior vena cava was not well visualized. IAS/Shunts: The interatrial septum was not well visualized.  LEFT VENTRICLE PLAX 2D LVIDd:         4.50 cm   Diastology LVIDs:  3.30 cm   LV e' medial:    7.00 cm/s LV PW:         1.30 cm   LV E/e' medial:  10.3 LV IVS:        1.20 cm   LV e' lateral:   7.00 cm/s LVOT diam:     2.00 cm   LV E/e' lateral: 10.3 LV SV:         61 LV SV Index:   27 LVOT Area:     3.14 cm  RIGHT VENTRICLE RV S prime:     15.30 cm/s TAPSE (M-mode): 1.2 cm LEFT ATRIUM             Index LA diam:        4.16 cm 1.83 cm/m LA Vol (A2C):   35.9 ml 15.76 ml/m LA Vol (A4C):   39.1 ml 17.16 ml/m LA Biplane Vol: 37.3 ml 16.37 ml/m  AORTIC VALVE LVOT Vmax:   135.00 cm/s LVOT  Vmean:  90.000 cm/s LVOT VTI:    0.193 m  AORTA Ao Root diam: 3.30 cm Ao Asc diam:  3.50 cm MITRAL VALVE                TRICUSPID VALVE MV Area (PHT): 4.96 cm     TR Peak grad:   24.4 mmHg MV Decel Time: 153 msec     TR Vmax:        247.00 cm/s MV E velocity: 72.20 cm/s MV A velocity: 106.00 cm/s  SHUNTS MV E/A ratio:  0.68         Systemic VTI:  0.19 m                             Systemic Diam: 2.00 cm Jerel Croitoru MD Electronically signed by Jerel Balding MD Signature Date/Time: 04/14/2024/2:55:48 PM    Final    CT ABDOMEN PELVIS W CONTRAST Result Date: 04/14/2024 CLINICAL DATA:  Abdominal pain. Status post hernia repair. Rule out bowel perforation. EXAM: CT ABDOMEN AND PELVIS WITH CONTRAST TECHNIQUE: Multidetector CT imaging of the abdomen and pelvis was performed using the standard protocol following bolus administration of intravenous contrast. RADIATION DOSE REDUCTION: This exam was performed according to the departmental dose-optimization program which includes automated exposure control, adjustment of the mA and/or kV according to patient size and/or use of iterative reconstruction technique. CONTRAST:  75mL OMNIPAQUE  IOHEXOL  350 MG/ML SOLN COMPARISON:  10/14/2020 FINDINGS: Lower chest: Small right pleural effusion with overlying atelectasis. There is also trace left pleural of fluid with mild subsegmental atelectasis. Hepatobiliary: No focal liver abnormality is seen. Status post cholecystectomy. No biliary dilatation. Pancreas: Diffuse pancreatic atrophy with scattered calcifications. No main duct dilatation or inflammation. Spleen: Normal in size without focal abnormality. Adrenals/Urinary Tract: Normal adrenal glands. 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 Left renal atrophy with severe hydronephrosis and hydroureter is identified which appears similar to the previous exam. New moderate right hydronephrosis and right hydroureter. Stomach/Bowel: There is a nasogastric tube  with tip in the proximal stomach. The proximal small bowel loops are increased in caliber containing air-fluid levels measuring up to 4.3 cm. Gradual transition to decreased caliber mid and distal small bowel begins in the left lower quadrant, axial image 71/2 and coronal image 32/4. Small amount of a dilute contrast passes beyond this area without significant contrast material noted in the distal small bowel loops or colon. There is soft tissue stranding within  the right lower quadrant of the abdomen. High attenuation material is identified scratch set there is also high density fluid noted within the right lower quadrant of the abdomen adjacent to the small bowel anastomosis, image 40/4 and image 65/2. Small bowel anastomosis is identified within the right lower quadrant of the abdomen. Just distal to the anastomosis there is a small defect identified within the wall loop of ileum with adjacent extraluminal gas, coronal image 41/4, sagittal image 29/5 and axial image 75/2. Vascular/Lymphatic: Aortic atherosclerosis. No aneurysm. No signs of abdominopelvic adenopathy. Reproductive: Status post prostatectomy. Other: Status post repair the midline umbilical hernia noted on the previous exam. There is widening of the subcutaneous soft tissues containing gas overlying the midline hernia repair which may reflect at least partial dehiscence. There are also postsurgical changes associated with repair of the large right lower quadrant parastomal hernia with persistent marked laxity of the right lower quadrant abdominal wall. A surgical drainage catheter enters from a left abdominal approach with tip terminating in the right hemipelvis. There is also a surgical drainage catheter which enters from a right lateral approach and terminates around the right lower quadrant herniorrhaphy site. Asymmetric edema with overlying skin thickening and subcutaneous soft tissue stranding is noted extending along right flank. There is  thickening of the right lateral abdominal wall musculature. This may reflect edema. Underlying intramuscular hematoma not excluded. Small volume of pneumoperitoneum is noted. Gas identified along the left lateral abdominal wall and right lower quadrant abdominal wall compatible with postoperative change. Small fluid collection along the left paramidline lower anterior abdominal wall measures 6.1 x 1.4 cm, image 82/3. Musculoskeletal: No acute or significant osseous findings. No acute or suspicious osseous findings. Unchanged superior endplate deformity at the L1 level which likely represents a Schmorl's node. IMPRESSION: 1. Right lower quadrant small bowel loop with mural defect and adjacent extraluminal gas identified, which is concerning for underlying small bowel perforation. 2. Increase caliber of the proximal small bowel loops with gradual transition to decreased caliber mid and distal small bowel begins in the left lower quadrant. Small amount of a dilute contrast passes beyond this area without significant contrast material noted in the distal small bowel loops or colon. Findings are concerning for small bowel ileus. Obstruction less favored but not excluded. 3. Status post repair of the midline umbilical hernia noted on the previous exam. There is widening of overlying subcutaneous soft tissues with gas. Correlate for any signs or symptoms of incisional dehiscence. 4. There are also postsurgical changes associated with repair of the large right lower quadrant parastomal hernia with persistent marked laxity of the right lower quadrant abdominal wall. 5. 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 Left renal atrophy with severe hydronephrosis and hydroureter is identified which appears similar to the previous exam. New moderate right hydronephrosis and right hydroureter. 6. Small right pleural effusion with overlying atelectasis. There is also trace left pleural of fluid with mild  subsegmental atelectasis. 7. Postoperative changes involving the right lower quadrant abdominal wall with gas and edema. There is thickening of the right lateral abdominal wall musculature which may reflect underlying hematoma. 8.  Aortic Atherosclerosis (ICD10-I70.0). Critical Value/emergent results were called by telephone at the time of interpretation on 04/14/2024 at 8:10 am to provider [facility] , who verbally acknowledged these results. Electronically Signed   By: Waddell Calk M.D.   On: 04/14/2024 08:10   CT Angio Chest Pulmonary Embolism (PE) W or WO Contrast Result Date: 04/14/2024 EXAM: CTA  of the Chest with contrast for PE 04/14/2024 04:12:39 AM TECHNIQUE: CTA of the chest was performed after the administration of intravenous contrast. Multiplanar reformatted images are provided for review. MIP images are provided for review. Automated exposure control, iterative reconstruction, and/or weight based adjustment of the mA/kV was utilized to reduce the radiation dose to as low as reasonably achievable. COMPARISON: CT chest 12/06/2023 at [facility]. CLINICAL HISTORY: Pulmonary embolism (PE) suspected, low to intermediate prob, positive D-dimer. Post-op pain, SOB, eval for perf; Bladder cancer. FINDINGS: PULMONARY ARTERIES: Pulmonary arteries are adequately opacified for evaluation. No pulmonary embolism. Main pulmonary artery is normal in caliber. MEDIASTINUM: The heart and pericardium demonstrate no acute abnormality. There is no acute abnormality of the thoracic aorta. Atherosclerotic changes are again seen within the coronary arteries. LYMPH NODES: Previously noted right hilar nodularity is less well seen on today's study and may be improved. LUNGS AND PLEURA: No new or enlarging pulmonary nodules are present. A moderate right-sided pleural effusion is present with partial collapse of the right lower lobe. UPPER ABDOMEN: Gastric tube is in place. SOFT TISSUES AND BONES: Fused anterior  osteophytes are present throughout the thoracic spine. No focal osseous lesions are present. IMPRESSION: 1. No evidence of pulmonary embolism. 2. Moderate right-sided pleural effusion with partial collapse of the right lower lobe. Electronically signed by: Lonni Necessary MD 04/14/2024 04:26 AM EDT RP Workstation: [id number]   DG Abd Portable 1V Result Date: 04/13/2024 CLINICAL DATA:  Tachypnea EXAM: PORTABLE ABDOMEN - 1 VIEW COMPARISON:  None Available. FINDINGS: Multiple dilated centrally located small bowel loops in the mid abdomen could correlate with early or partial small bowel obstruction. Drain catheter peritoneal catheter in the left lower quadrant region of the abdomen. Subcutaneous air and edema in the left side of the abdominal wall. IMPRESSION: Multiple dilated centrally located small bowel loops in the mid abdomen could correlate with early or partial small bowel obstruction. Electronically Signed   By: Franky Chard M.D.   On: 04/13/2024 08:19   DG Abd Portable 1V Result Date: 04/13/2024 CLINICAL DATA:  NG tube placement EXAM: PORTABLE ABDOMEN - 1 VIEW COMPARISON:  None Available. FINDINGS: the tip of the nasogastric tube is located within the body fundus stomach in good position. IMPRESSION: Enteric tube in the stomach. Electronically Signed   By: Franky Chard M.D.   On: 04/13/2024 08:15   US  EKG SITE RITE Result Date: 04/13/2024 If Site Rite image not attached, placement could not be confirmed due to current cardiac rhythm.  DG CHEST PORT 1 VIEW Result Date: 04/13/2024 CLINICAL DATA:  Tachypnea EXAM: PORTABLE CHEST 1 VIEW COMPARISON:  12/06/2018 FINDINGS: The cardiomediastinal contours are unremarkable. Lung volumes are low. There are small bilateral pleural effusions. Mild subsegmental atelectasis within the lung bases. No consolidative change. IMPRESSION: Low lung volumes and small bilateral pleural effusions. Electronically Signed   By: Waddell Calk M.D.   On: 04/13/2024 07:23     Labs:  CBC: Recent Labs    04/30/24 0330 05/01/24 0417 05/01/24 1313 05/04/24 0341  WBC 14.5* 13.6* 16.4* 12.4*  HGB 8.4* 7.9* 8.2* 9.1*  HCT 26.5* 25.1* 26.3* 28.0*  PLT 217 135* 111* 136*    COAGS: Recent Labs    04/13/24 1047 04/14/24 1141 04/23/24 0416 04/24/24 0338 04/24/24 0601  INR 1.6* 1.6*  --   --   --   APTT 47*  --  37* 189* 167*    BMP: Recent Labs    05/01/24 0417 05/02/24 0515 05/03/24  0453 05/04/24 0341  NA 135 135 132* 133*  K 4.9 5.3* 5.4* 5.0  CL 105 102 101 99  CO2 25 23 20* 21*  GLUCOSE 159* 123* 170* 166*  BUN 38* 69* 96* 109*  CALCIUM  7.9* 8.6* 8.4* 9.2  CREATININE 1.58* 2.96* 3.60* 3.86*  GFRNONAA 46* 22* 17* 16*    LIVER FUNCTION TESTS: Recent Labs    04/21/24 1715 04/22/24 1604 04/23/24 0416 04/23/24 1600 04/26/24 0644 04/27/24 0453 04/28/24 0441 05/01/24 0417 05/02/24 0515 05/03/24 0453 05/04/24 0341  BILITOT 0.7  --  0.7  --  0.9 0.6  --   --   --   --   --   AST 19  --  21  --  25 20  --   --   --   --   --   ALT 12  --  13  --  33 29  --   --   --   --   --   ALKPHOS 48  --  46  --  58 58  --   --   --   --   --   PROT 4.7*  --  4.9*  --  5.6* 5.5*  --   --   --   --   --   ALBUMIN  2.1*   < > 1.8*  1.7*   < > 2.0* 1.8*  1.8*   < > 2.0* 2.2* 2.3* 2.4*   < > = values in this interval not displayed.    TUMOR MARKERS: No results for input(s): AFPTM, CEA, CA199, CHROMGRNA in the last 8760 hours.  Assessment and Plan:  Request for  image guided bilateral diverting PCNs No contraindications for procedure identified in ROS, physical exam, or review of pre-sedation considerations.  Labs reviewed and within acceptable range pending INR which was ordered and collected at 06 25 05/03/2024 CT AP imaging available and reviewed Tachycardic (115 bpm), afebrile, currently undergoing treatment for sepsis Heparin  paused appropriately at 8 AM Zosyn  being administered as part of inpatient regiment   Risks and  benefits of bilateral PCN placement was discussed with the patient including, but not limited to, infection, bleeding, significant bleeding causing loss or decrease in renal function or damage to adjacent structures.   All of the patient's spouse and son's questions were answered, patient and family is agreeable to proceed.  Consent signed by Adrien Merchant and in chart.    Thank you for allowing our service to participate in KORVER GRAYBEAL 's care.    Electronically Signed: Laymon Coast, NP   05/04/2024, 9:01 AM     I spent a total of 40 Minutes    in face to face in clinical consultation, greater than 50% of which was counseling/coordinating care for bilateral diverting PCNs   (A copy of this note was sent to the referring provider and the time of visit.)

## 2024-05-04 NOTE — Procedures (Signed)
 Interventional Radiology Procedure Note  Procedure: Bilateral percutaneous nephrostomy tube placements  Findings: Please refer to procedural dictation for full description. 10 Fr bilateral to bag drainage.  Complications: None immediate  Estimated Blood Loss: < 5 ml  Recommendations: Keep to bag drainage.  IR will follow.  Ultimate management as per Urology.   Ester Sides, MD

## 2024-05-04 NOTE — Consult Note (Signed)
 WOC Nurse wound follow up Wound type: surgical  Measurement:19 cm x 6 cm x 4 cm  Wound bed: red moist Drainage (amount, consistency, odor) serosanguinous in cannister  Periwound: intact Dressing procedure/placement/frequency: Removed old NPWT dressing Cleansed wound with normal saline   Filled wound with  __3__ piece of black foam  Sealed NPWT dressing at HG  Patient tolerated procedure well  WOC nurse will continue to provide NPWT dressing changed due to the complexity of the dressing change.   WOC Nurse ostomy follow up Stoma type/location: RLQ ileal conduit, RUQ ileostomy Stomal assessment/size:  RLQ ileal conduit: oval approximately 1 in x 1 1/4 in RUQ ileostomy 1 1/4 in round, slightly budded, os at 1 o'clock  Peristomal assessment:  Dips in abdominal topography surrounding ileal conduit RUQ ileostomy: surrounding skin intact Treatment options for stomal/peristomal skin: 2 in barrier ring utilized around both stomas    Output Clear urine Liquid brown stool Ostomy pouching: 1pc. Soft convex urostomy and ileostomy pouch  Education provided: none- patient is alert with minimal interaction but does not present as ready and cognitive to participate Enrolled patient in [employer] Discharge program: No- will re-assess once status improves.        WOC team will continue to provide NPWT dressing changed due to the complexity of the dressing change and ostomy teaching when appropriate.  Doyal Polite, RN, MSN, [facility] WOC Team

## 2024-05-04 NOTE — Progress Notes (Addendum)
 Nutrition Follow-up  DOCUMENTATION CODES:  Not applicable  INTERVENTION:  Continue current diet as ordered Encourage PO intake when pt alert When accepting, recommend adding Ensure Plus High Protein po BID, each supplement provides 350 kcal and 20 grams of protein Continue the following until pt's PO intake improves.  Vital 1.5 at 100 ml/h x 14 hours (1440 ml per day) Prosource TF20 60 ml BID Provides 2320 kcal, 137 gm protein, 1100 ml free water  daily 1 packet Juven BID, each packet provides 95 calories, 2.5 grams of protein (collagen) + micronutrients to support wound healing   NUTRITION DIAGNOSIS:  Increased nutrient needs related to acute illness as evidenced by estimated needs. - remains applicable  GOAL:  Patient will meet greater than or equal to 90% of their needs - progressing  MONITOR:  PO intake, TF tolerance, Labs, Weight trends, Skin  REASON FOR ASSESSMENT:  Consult Assessment of nutrition requirement/status (CRRT)  ASSESSMENT:  73 y.o male patient with history of Parastomal hernia of ileal conduit. On 5/30 underwent hernia repair with urostomy revision.  5/30 - Admit; s/p lysis of ahdesions, repair of incisional, parastomal, left inguinal incarcerated abdominal wall hernias, urostomy ileal conduit revision; CLD 6/2 - NPO; NGT placed for suction; TPN initiated 6/4 - TPN increased to goal; NGT removed by patient; CLD then back to NPO after emesis; NGT replaced 6/9 - OR in the early AM for ex-lap, drainage of right abdominal wall abscess, explantation fo abdominal mesh, small bowel resection (left In discontinuity), AbThera wound VAC placement; returned to ICU intubated 6/10 - OR for washout and closure 6/11 - Worsening AKI with fluid overload, start CRRT 6/14 - extubated 6/15 - transferred to [facility] for iHD 6/16 - TPN discontinued, tolerating TF 6/17 - restarted on CRRT 6/19 - BSE DYS1 6/20 - BSE DYS2 6/20 - CRRT stopped  Pt out of room in IR at the  time of assessment for placement of bilateral nephrostomy tubes. Has been NPO today for procedure.   CRRT stopped on Friday. BUN and creatinine quickly climbed. Did not tolerate intermittent HD well today and UF goal not met due to elevated heart rate and hypotension.   Reviewed RN notes about intake, appears to be minimal at this time. Will continue to monitor and recommend adjustments to nutrition plan as status changes.  Admit weight: 108.9 kg ? Accuracy, copied forward First measured weight: 98.4 kg (6/5) Current weight: 100.2 kg    Intake/Output Summary (Last 24 hours) at 05/04/2024 1417 Last data filed at 05/04/2024 1330 Gross per 24 hour  Intake 2599.35 ml  Output 4030 ml  Net -1430.65 ml  Net IO Since Admission: -2,439.95 mL [05/04/24 1417] UOP: 0mL x 24 hours  Drains/Lines: JP Drain, right lateral abdomen, 2675 mL x 24 hours JP Drain, left lateral abdomen, 1415 mL x 24 hours Ileostomy RUQ, x 24 hours Cortrak (gastric) PICC, triple lumen Temporary HD catheter, triple lumen left IJ Wound Vac, upper medial abdomen, 25mL output x 24 hours  Nutritionally Relevant Medications: Scheduled Meds:  PROSource TF20  60 mL Per Tube BID   VITAL 1.5 CAL  1,440 mL Per Tube Q24H   insulin  aspart  0-20 Units Subcutaneous Q4H   insulin  aspart  6 Units Subcutaneous 4 times per day   insulin  glargine-yfgn  25 Units Subcutaneous QHS   loperamide  HCl  2 mg Per Tube QHS   JUVEN  1 packet Per Tube BID BM   polycarbophil  625 mg Per Tube BID  sevelamer  carbonate  800 mg Per Tube Q8H   sodium zirconium cyclosilicate   10 g Per Tube BID   Continuous Infusions:  albumin  human     micafungin  (MYCAMINE ) 100 mg in sodium chloride  0.9 % 100 mL IVPB Stopped (05/04/24 1119)   piperacillin -tazobactam (ZOSYN )  IV Stopped (05/04/24 0539)   PRN Meds: ondansetron , phenol  Labs Reviewed: Sodium 133 BUN 109, creatinine 3.86 Phosphorus 7.2 CBG ranges from 95-161 mg/dL over the last 24  hours HgbA1c 5.3% (5/21)  NUTRITION - FOCUSED PHYSICAL EXAM: Flowsheet Row Most Recent Value  Orbital Region Mild depletion  Upper Arm Region No depletion  Thoracic and Lumbar Region No depletion  Buccal Region No depletion  Temple Region No depletion  Clavicle Bone Region No depletion  Clavicle and Acromion Bone Region Mild depletion  Scapular Bone Region Mild depletion  Dorsal Hand No depletion  Patellar Region Severe depletion  Anterior Thigh Region Severe depletion  Posterior Calf Region Severe depletion  Edema (RD Assessment) Moderate  [arms legs]  Hair Reviewed  Eyes Reviewed  Mouth Reviewed  Skin Reviewed  Nails Reviewed    Diet Order:   Diet Order             DIET - DYS 1 Room service appropriate? Yes; Fluid consistency: Thin  Diet effective now                   EDUCATION NEEDS:  Not appropriate for education at this time  Skin: Skin Integrity Issues: Stage 2: - Sacrum (1 x 1 cm) Surgical Incisions: - abdomen, wound vac in place, (19 cm x 6 cm x 4 cm)  Last BM:  6/23 via ileostomy  Height:  Ht Readings from Last 1 Encounters:  04/24/24 5' 11 (1.803 m)   Weight:  Wt Readings from Last 1 Encounters:  05/04/24 100.2 kg   Ideal Body Weight:  78.2 kg  BMI:  Body mass index is 30.81 kg/m.  Estimated Nutritional Needs:  Kcal:  2200-2500 kcal/d Protein:  120-140g/d Fluid:  2.2-2.5L/d    Vernell Lukes, RD, LDN Registered Dietitian II Please reach out via secure chat

## 2024-05-04 NOTE — Progress Notes (Addendum)
 05/04/2024  MADEX SEALS [id number] 03/27/1951  CARE TEAM: PCP: [name], MD  Outpatient Care Team: Patient Care Team: [name], MD as PCP - General (Internal Medicine) [name], MD (Inactive) as Consulting Physician (Dermatology) [name], MD as Referring Physician (Pulmonary Disease) [name], MD as Consulting Physician (General Surgery) [name], MD as Consulting Physician (Urology)  Inpatient Treatment Team: Treatment Team:  [name], MD [name], MD [name], MD Pccm, Md, MD Dolan Mateo Larger, MD Massie Delaine SAUNDERS, RN Norine Manuelita DELENA, MD Dea Shiner, MD Joshua Josepha SAILOR, RN Glendia Dena BIRCH, RN Sharl Lesley SAUNDERS, NT Prater, Lenoard NOVAK, PT Val Violeta FALCON, RN   Problem List:   Principal Problem:   Delayed bowel perforation s/p SBR/end ileostomy Active Problems:   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 08/08/2017   S/P ileal conduit (HCC)   GERD (gastroesophageal reflux disease)   Obesity (BMI 35.0-39.9 without comorbidity)   Prolonged QT interval   Non-recurrent bilateral inguinal hernia without obstruction or gangrene   Incarcerated incisional hernia   Sinus tachycardia   Tachypnea   Acute respiratory insufficiency, postoperative   Sepsis due to undetermined organism (HCC)   Lactic acidosis   Class 2 obesity   Chronic anticoagulation   Hearing loss   History of bladder cancer   Obstructive sleep apnea of adult   Parastomal hernia of ileal conduit   Partial small bowel obstruction (HCC)   Personal history of PE (pulmonary embolism)   Pressure injury of skin   04/10/2024  POST-OPERATIVE DIAGNOSIS:  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   PROCEDURE:   -ROBOTIC LYSIS OF ADHESIONS X 4 HOURS -COMPONENT SEPARATION - TRANSVERSUS ABDOMINIS REALEASE (TAR) BILATERAL -ROBOTIC REPAIRS OF  INCISIONAL, PARASTOMAL, LEFT INGUINAL  INCARCERATED ABDOMINAL WALL  HERNIAS WITH MESH -UROSTOMY ILEAL CONDUIT REVISION -INTRAOPERATIVE ASSESSMENT OF TISSUE VASCULAR PERFUSION USING ICG (indocyanine green ) -IMMUNOFLUORESCENCE -TRANSVERSUS ABDOMINIS PLANE (TAP) BLOCK - BILATERAL    SURGEON:  [name], MD  OR FINDINGS:  Patient had dense adhesions anterior abdominal wall.  Largest hernia was in the right lower quadrant around the ileal conduit urostomy.  16 x 12 cm region.  Incarcerated with over a foot of small bowel.  Midline next largest 9 x 8 cm incarcerated with numerous loops of small bowel.  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  Another direct space hernia in the left lower quadrant and indirect hernia on the left lower quadrant.  No obvious hernias on the right side.   Component separation's done on both sides to try and get the largest midline and parastomal hernias close down with running serrated try to fix absorbable suture.  Broad underlay repair with 30 x 35 cm mesh transverse  04/20/2024 POST-OPERATIVE DIAGNOSIS:  perforated bowel   PROCEDURE:  Drainage of right abdominal wall abscess as well as intra-abdominal abscess Explantation of abdominal mesh Small bowel resection (in discontinuity) Placement of ABThera wound VAC   SURGEON:  [name], MD  ASSISTANTS: Signe Mitzie DELENA, MD   04/21/2024  POST-OPERATIVE DIAGNOSIS:  DELAYED BOWEL PERFORATION WITH OPEN ABDOMEN   PROCEDURE:   LYSIS OF ADHESIONS X ABDOMINAL WALL DEBRIDEMENT ILEAL RESECTION END ILEOSTOMY ABDOMINAL WALL PARASTOMAL & INCISIONAL HERNIA REPAIRS WITH PHASIX MESH FASCIA CLOSURE WITH WOUND VAC PLACEMENT IN SQ   SURGEON:  [name], MD   ASSISTANT:  Herlene Bureau, MD   Ostomy:  Right upper quadrant: End ileostomy Right lower  quadrant: Urostomy ileal conduit   Drains: 19 Fr Blake drains x2   Right lower quadrant drain rests in the right panniculus abdominal wall between the Phasix mesh and abdominal wall   Left lower  quadrant drain rest in the anterior peritoneum between the mesh and the bowel with the tip resting in the right lower quadrant near the base of the ileal conduit and prior bowel resection  Assessment [facility] Stay = 24 days) 13 Days Post-Op    Delayed urine leak  Plan:  Increase clear drain output from both abdominal wall and intraperitoneal drains with high creatinine consistent with leak at urine ileal conduit.    Discussed with Dr. Renda with urology.  There is no evidence of any mucosal separation of the skin and the ostomy is viable and pink.  Dr. Renda is skeptical that there is a leak at the ureters or at the uretero-ileal anastomosis.  Therefore it must be in the middle of the urostomy segment for some reason.  Continue drainage with abdominal wall and intraperitoneal drains  Needs urinary diversion PERC nephrostomy tubes.  Dr. Florene Urology has reached out to interventional radiology for percutaneous nephrostomy tubes for diversion and regroup.  Nutrition:  Cycling tube feeds.  Tolerating 166mL/hour x 14 hours during evening/night.  Fiber to thicken ileostomy effluent -soluble polyfiber/FiberCon  Add loperamide  to avoid too much high output ileostomy.  Weaned off TPN 6/16   PO as tolerated.  Patient more alert and talking today.  Will see if speech therapy can retry evaluation and get on a dysphagia 1 diet.  Hold off on G-tube unless poor PO effort >14days  Infection due to delayed bowel perforation and abdominal wall infection/necrosis  Growing multiple organisms including Candida albicans, Enterobacter, and Enterococcus.    IV Zosyn /micafungin .  Anticipate prolonged antibiotics given the abdominal wall infection/necrosis and need for Phasix mesh.   Change wound vac q MonWedFri the hopes for the subcutaneous tissues to recover.  Last change 6/20 showing early granulation without necrosis guardedly reassuring.      Metabolic encephalopathy with all  the stresses he has been through, especially Uremia improved with dialysis but back up with delayed urine leak.  Back on dialysis.  Follow closely   CT of chest negative for any obvious pulmonary embolism or pneumonia.  Oxygen needs less.  Echocardiogram shows grade 1 diastolic dysfunction which I believe is his baseline.  Defer to critical care/internal medicine they feel further is needed aside for some monitor diuresis.  ABLA on top of anemia of chronic disease improved with transfusion.  Follow.    -monitor electrolytes & replace as needed.  Keep K>4, Mg>2, Phos>3.    -Diabetes.  Sliding scale insulin .    -VTE prophylaxis-  Full anticoagulation heparin  drip given history of pulmonary embolism,.  Could consider back on DOAC enteral route once off CRRTx  -Mobilize as tolerated.  Agree with critical care about having physical and Occupational Therapy get more involved now that he is more alert.  Hopefully can rebuild strength and minimize further atrophy.  Tentatively saying FL 2 discussion about rehab.  I do not want this patient going to an LTAC.  He is not ready.  I updated the patient's status to the the patient & ICU RN in room.  Recommendations were made.  Questions were answered.  They expressed understanding & appreciation.  -Disposition: He is going to be here a while       I reviewed nursing notes, last 24 h vitals and  pain scores, last 48 h intake and output, last 24 h labs and trends, and last 24 h imaging results.  I have reviewed this patient's available data, including medical history, events of note, test results, etc as part of my evaluation.   A significant portion of that time was spent in counseling. Care during the described time interval was provided by me.  This care required high  level of medical decision making.  05/04/2024    Subjective: (Chief complaint)  Increased drain output with elevated creatinine consistent with delayed leak and ileal  conduit.  Back on dialysis.  Patient feeling tired/lower.  Wife at bedside.  ICU nurse in room.  Objective:  Vital signs:  Vitals:   05/04/24 0630 05/04/24 0638 05/04/24 0645 05/04/24 0700  BP: 124/70  130/74 126/75  Pulse: (!) 106 (!) 102 (!) 114 (!) 105  Resp: 20 16 17  (!) 23  Temp: (!) 97 F (36.1 C)     TempSrc: Oral     SpO2: 97% 99% 99% 98%  Weight:      Height:        Last BM Date :  (t)  Intake/Output   Yesterday:  06/22 0701 - 06/23 0700 In: 2174.1 [I.V.:304.2; NG/GT:1600; IV Piggyback:205] Out: 5264 [Imjpwd:5884; Stool:620] This shift:  No intake/output data recorded.  Bowel function:  Flatus: YES  BM:  YES -small  Drains:  RLQ drain (rests between abdominal wall and Phasix mesh): Serous    LLQ drain (runs over bowel with tip down in RLQ pelvis near base of ileal conduit and site of bowel resection):  thinly serosanguinous   Wound vac (in SQ over closed fascia):  serosanguineous   Physical Exam:  General: Awake but not providing much eye contact.  Follows a few commands.  Answering simple questions.  Not agitated Eyes: PERRL, normal EOM.  Sclera clear.  No icterus Neuro: CN II-XII intact w/o focal sensory/motor deficits. Lymph: No head/neck/groin lymphadenopathy Psych:  No delerium/psychosis/paranoia.  No agitation HENT: Normocephalic, Mucus membranes moist.  No thrush.  ETT & NGT in place Neck: Supple, No tracheal deviation.  No obvious thyromegaly Chest: No pain to chest wall compression.  Good respiratory excursion.   CV:  Pulses intact.  Regular rhythm.  1-2+ BUE/BLE edema MS:  No obvious deformity  Abdomen:  Obese but Soft.  Nondistended.  Wound vac in midline.    Flank ecchymosis bilateral posterior resolving.  Abdominal edema mostly resolved   RUQ: (End ileostomy): Green oatmeal thick effluent in bag RLQ:  (Urostomy ileal conduit): pink mucosa without any obvious separation.  Scant clear light yellow-colored urine  Ext:   No  deformity.  Bilateral hand 0-1+ edema proved.  No lower extremity edema.  No cyanosis Skin: No petechiae / purpurea.  No major sores.  Warm and dry    Results:   Cultures: Recent Results (from the past 720 hours)  Culture, blood (Routine X 2) w Reflex to ID Panel     Status: None   Collection Time: 04/13/24  7:13 AM   Specimen: BLOOD  Result Value Ref Range Status   Specimen Description   Final    BLOOD BLOOD LEFT ARM AEROBIC BOTTLE ONLY ANAEROBIC BOTTLE ONLY Performed at [facility], [address], [place], [place] [address]    Special Requests   Final    BOTTLES DRAWN AEROBIC AND ANAEROBIC Blood Culture results may not be optimal due to an inadequate volume of blood received in culture bottles Performed at  [facility], [address] [place], [place] [address]    Culture   Final    NO GROWTH 5 DAYS Performed at [facility], [address] [place], [place] [address]    Report Status 04/18/2024 FINAL  Final  Culture, blood (Routine X 2) w Reflex to ID Panel     Status: None   Collection Time: 04/13/24  7:20 AM   Specimen: BLOOD  Result Value Ref Range Status   Specimen Description   Final    BLOOD BLOOD RIGHT ARM AEROBIC BOTTLE ONLY ANAEROBIC BOTTLE ONLY Performed at [facility], [address], [place], [place] [address]    Special Requests   Final    BOTTLES DRAWN AEROBIC AND ANAEROBIC Blood Culture results may not be optimal due to an inadequate volume of blood received in culture bottles Performed at [facility], [address], [place], [place] [address]    Culture   Final    NO GROWTH 5 DAYS Performed at [facility], [address] [place], [place] [address]    Report Status 04/18/2024 FINAL  Final  Urine Culture     Status: None   Collection Time: 04/13/24 10:18 AM   Specimen: Urine, Clean Catch  Result Value Ref Range Status   Specimen Description   Final    URINE,  CLEAN CATCH Performed at [facility], [address], [place], [place] [address]    Special Requests   Final    NONE Performed at [facility], [address], [place], [place] [address]    Culture   Final    NO GROWTH Performed at [facility], [address] [place], [place] [address]    Report Status 04/14/2024 FINAL  Final  MRSA Next Gen by PCR, Nasal     Status: None   Collection Time: 04/13/24 11:35 AM   Specimen: Nasal Mucosa; Nasal Swab  Result Value Ref Range Status   MRSA by PCR Next Gen NOT DETECTED NOT DETECTED Final    Comment: (NOTE) The GeneXpert MRSA Assay (FDA approved for NASAL specimens only), is one component of a comprehensive MRSA colonization surveillance program. It is not intended to diagnose MRSA infection nor to guide or monitor treatment for MRSA infections. Test performance is not FDA approved in patients less than 15 years old. Performed at [facility], [address] [place], [place] [address]   Culture, blood (Routine X 2) w Reflex to ID Panel     Status: None   Collection Time: 04/20/24  8:46 AM   Specimen: BLOOD RIGHT ARM  Result Value Ref Range Status   Specimen Description   Final    BLOOD RIGHT ARM Performed at [facility], [address], [place], [place] [address]    Special Requests   Final    BOTTLES DRAWN AEROBIC AND ANAEROBIC Blood Culture results may not be optimal due to an inadequate volume of blood received in culture bottles Performed at [facility], [address], [place], [place] [address]    Culture   Final    NO GROWTH 5 DAYS Performed at [facility], [address], [place], [place] [address]    Report Status 04/25/2024 FINAL  Final  Culture, blood (Routine X 2) w Reflex to ID Panel     Status: None   Collection Time: 04/20/24  8:49 AM   Specimen:  BLOOD  Result Value Ref Range Status   Specimen Description   Final     BLOOD Performed at [facility], [address] [place], [place] [address]    Special Requests   Final    BOTTLES DRAWN AEROBIC AND ANAEROBIC Blood Culture results may not be optimal due to an inadequate volume of blood received in culture bottles Performed at [facility], [address], [place], [place] [address]    Culture   Final    NO GROWTH 5 DAYS Performed at [facility], [address], [place], [place] [address]    Report Status 04/25/2024 FINAL  Final  Culture, Respiratory w Gram Stain     Status: None   Collection Time: 04/20/24 12:03 PM   Specimen: Tracheal Aspirate; Respiratory  Result Value Ref Range Status   Specimen Description   Final    TRACHEAL ASPIRATE Performed at [facility], [address], [place], [place] [address]    Special Requests   Final    NONE Performed at [facility], [address] [place], [place] [address]    Gram Stain NO WBC SEEN RARE GRAM POSITIVE COCCI   Final   Culture   Final    RARE Normal respiratory flora-no Staph aureus or Pseudomonas seen Performed at [facility], [address], [place], [place] [address]    Report Status 04/23/2024 FINAL  Final  Aerobic/Anaerobic Culture w Gram Stain (surgical/deep wound)     Status: None   Collection Time: 04/21/24  2:23 PM   Specimen: Soft Tissue, Other  Result Value Ref Range Status   Specimen Description   Final    TISSUE INTRA ABDOMINAL NECROTIC TISSUE Performed at [facility], [address] [place], [place] [address]    Special Requests   Final    NONE Performed at [facility], [address] [place], [place] [address]    Gram Stain   Final    FEW WBC PRESENT,BOTH PMN AND MONONUCLEAR RARE GRAM POSITIVE COCCI IN PAIRS AND CHAINS RARE YEAST    Culture   Final    RARE ESCHERICHIA COLI FEW CANDIDA ALBICANS FEW ENTEROCOCCUS FAECALIS FEW BACTEROIDES  SPECIES BETA LACTAMASE POSITIVE Performed at [facility], [address], [place], [place] [address]    Report Status 04/25/2024 FINAL  Final   Organism ID, Bacteria ESCHERICHIA COLI  Final   Organism ID, Bacteria ENTEROCOCCUS FAECALIS  Final      Susceptibility   Escherichia coli - MIC*    AMPICILLIN >=32 RESISTANT Resistant     CEFEPIME  <=0.12 SENSITIVE Sensitive     CEFTAZIDIME <=1 SENSITIVE Sensitive     CEFTRIAXONE  1 SENSITIVE Sensitive     CIPROFLOXACIN  <=0.25 SENSITIVE Sensitive     GENTAMICIN  <=1 SENSITIVE Sensitive     IMIPENEM <=0.25 SENSITIVE Sensitive     TRIMETH /SULFA  <=20 SENSITIVE Sensitive     AMPICILLIN/SULBACTAM >=32 RESISTANT Resistant     PIP/TAZO 8 SENSITIVE Sensitive ug/mL    * RARE ESCHERICHIA COLI   Enterococcus faecalis - MIC*    AMPICILLIN <=2 SENSITIVE Sensitive     VANCOMYCIN  1 SENSITIVE Sensitive     GENTAMICIN  SYNERGY SENSITIVE Sensitive     * FEW ENTEROCOCCUS FAECALIS    Labs: Results for orders placed or performed during the hospital encounter of 04/10/24 (from the past 48 hours)  Glucose, capillary     Status: Abnormal   Collection  Time: 05/02/24  7:28 AM  Result Value Ref Range   Glucose-Capillary 172 (H) 70 - 99 mg/dL    Comment: Glucose reference range applies only to samples taken after fasting for at least 8 hours.  Creatinine, fluid (JP Drainage)     Status: None   Collection Time: 05/02/24  8:38 AM  Result Value Ref Range   Creat, Fluid 15.1 mg/dL    Comment: (NOTE) No normal range established for this test Results should be evaluated in conjunction with serum values    Fluid Type-FCRE JP DRAIN R     Comment: Performed at [facility], [address] [place], [place] [address] CORRECTED ON 06/21 AT 9089: PREVIOUSLY REPORTED AS JP DRAINAGE   Creatinine, fluid (JP Drainage)     Status: None   Collection Time: 05/02/24  8:40 AM  Result Value Ref Range   Creat, Fluid 18.6 mg/dL    Comment: (NOTE) No normal range  established for this test Results should be evaluated in conjunction with serum values    Fluid Type-FCRE JP DRAIN L     Comment: Performed at [facility], [address] [place], [place] [address] CORRECTED ON 06/21 AT 9089: PREVIOUSLY REPORTED AS JP DRAINAGE   Glucose, capillary     Status: Abnormal   Collection Time: 05/02/24 11:20 AM  Result Value Ref Range   Glucose-Capillary 107 (H) 70 - 99 mg/dL    Comment: Glucose reference range applies only to samples taken after fasting for at least 8 hours.  Glucose, capillary     Status: Abnormal   Collection Time: 05/02/24  3:24 PM  Result Value Ref Range   Glucose-Capillary 142 (H) 70 - 99 mg/dL    Comment: Glucose reference range applies only to samples taken after fasting for at least 8 hours.  Glucose, capillary     Status: Abnormal   Collection Time: 05/02/24  7:42 PM  Result Value Ref Range   Glucose-Capillary 152 (H) 70 - 99 mg/dL    Comment: Glucose reference range applies only to samples taken after fasting for at least 8 hours.  Glucose, capillary     Status: Abnormal   Collection Time: 05/02/24 11:38 PM  Result Value Ref Range   Glucose-Capillary 132 (H) 70 - 99 mg/dL    Comment: Glucose reference range applies only to samples taken after fasting for at least 8 hours.  Glucose, capillary     Status: Abnormal   Collection Time: 05/03/24  3:49 AM  Result Value Ref Range   Glucose-Capillary 160 (H) 70 - 99 mg/dL    Comment: Glucose reference range applies only to samples taken after fasting for at least 8 hours.  Heparin  level (unfractionated)     Status: None   Collection Time: 05/03/24  4:53 AM  Result Value Ref Range   Heparin  Unfractionated 0.54 0.30 - 0.70 IU/mL    Comment: (NOTE) The clinical reportable range upper limit is being lowered to >1.10 to align with the FDA approved guidance for the current laboratory assay.  If heparin  results are below expected values, and patient dosage has  been confirmed,  suggest follow up testing of antithrombin III levels. Performed at [facility], [address], [place], [place] [address]   Renal function panel (daily at 0500)     Status: Abnormal   Collection Time: 05/03/24  4:53 AM  Result Value Ref Range   Sodium 132 (L) 135 - 145 mmol/L   Potassium 5.4 (  H) 3.5 - 5.1 mmol/L   Chloride 101 98 - 111 mmol/L   CO2 20 (L) 22 - 32 mmol/L   Glucose, Bld 170 (H) 70 - 99 mg/dL    Comment: Glucose reference range applies only to samples taken after fasting for at least 8 hours.   BUN 96 (H) 8 - 23 mg/dL   Creatinine, Ser 6.39 (H) 0.61 - 1.24 mg/dL    Comment: DELTA CHECK NOTED OKAY TO RELEASE AS PER RN    Calcium  8.4 (L) 8.9 - 10.3 mg/dL   Phosphorus 6.5 (H) 2.5 - 4.6 mg/dL   Albumin  2.3 (L) 3.5 - 5.0 g/dL   GFR, Estimated 17 (L) >60 mL/min    Comment: (NOTE) Calculated using the CKD-EPI Creatinine Equation (2021)    Anion gap 11 5 - 15    Comment: Performed at [facility], [address] [place], [place] [address]  Magnesium      Status: None   Collection Time: 05/03/24  4:53 AM  Result Value Ref Range   Magnesium  2.1 1.7 - 2.4 mg/dL    Comment: Performed at [facility], [address], [place], [place] [address]  Glucose, capillary     Status: Abnormal   Collection Time: 05/03/24  7:24 AM  Result Value Ref Range   Glucose-Capillary 116 (H) 70 - 99 mg/dL    Comment: Glucose reference range applies only to samples taken after fasting for at least 8 hours.  Glucose, capillary     Status: Abnormal   Collection Time: 05/03/24 11:54 AM  Result Value Ref Range   Glucose-Capillary 136 (H) 70 - 99 mg/dL    Comment: Glucose reference range applies only to samples taken after fasting for at least 8 hours.  Glucose, capillary     Status: Abnormal   Collection Time: 05/03/24  3:42 PM  Result Value Ref Range   Glucose-Capillary 125 (H) 70 - 99 mg/dL    Comment: Glucose reference range applies only to samples taken after fasting for  at least 8 hours.  Glucose, capillary     Status: None   Collection Time: 05/03/24  7:39 PM  Result Value Ref Range   Glucose-Capillary 95 70 - 99 mg/dL    Comment: Glucose reference range applies only to samples taken after fasting for at least 8 hours.  Glucose, capillary     Status: Abnormal   Collection Time: 05/03/24 11:15 PM  Result Value Ref Range   Glucose-Capillary 120 (H) 70 - 99 mg/dL    Comment: Glucose reference range applies only to samples taken after fasting for at least 8 hours.  Prealbumin     Status: None   Collection Time: 05/04/24 12:00 AM  Result Value Ref Range   Prealbumin 28 18 - 38 mg/dL    Comment: Performed at [facility], [address], [place], [place] [address]  Glucose, capillary     Status: Abnormal   Collection Time: 05/04/24  3:39 AM  Result Value Ref Range   Glucose-Capillary 159 (H) 70 - 99 mg/dL    Comment: Glucose reference range applies only to samples taken after fasting for at least 8 hours.  Heparin  level (unfractionated)     Status: None   Collection Time: 05/04/24  3:41 AM  Result Value Ref Range   Heparin  Unfractionated 0.58 0.30 - 0.70 IU/mL    Comment: (NOTE) The clinical reportable range upper limit is being lowered to >1.10 to align with the FDA approved  guidance for the current laboratory assay.  If heparin  results are below expected values, and patient dosage has  been confirmed, suggest follow up testing of antithrombin III levels. Performed at [facility], [address] [place], [place] [address]   Renal function panel (daily at 0500)     Status: Abnormal   Collection Time: 05/04/24  3:41 AM  Result Value Ref Range   Sodium 133 (L) 135 - 145 mmol/L   Potassium 5.0 3.5 - 5.1 mmol/L   Chloride 99 98 - 111 mmol/L   CO2 21 (L) 22 - 32 mmol/L   Glucose, Bld 166 (H) 70 - 99 mg/dL    Comment: Glucose reference range applies only to samples taken after fasting for at least 8 hours.   BUN 109 (H) 8 - 23 mg/dL    Creatinine, Ser 6.13 (H) 0.61 - 1.24 mg/dL   Calcium  9.2 8.9 - 10.3 mg/dL   Phosphorus 7.2 (H) 2.5 - 4.6 mg/dL   Albumin  2.4 (L) 3.5 - 5.0 g/dL   GFR, Estimated 16 (L) >60 mL/min    Comment: (NOTE) Calculated using the CKD-EPI Creatinine Equation (2021)    Anion gap 13 5 - 15    Comment: Performed at [facility], [address] [address] [place], [place] [address]  Magnesium      Status: None   Collection Time: 05/04/24  3:41 AM  Result Value Ref Range   Magnesium  2.0 1.7 - 2.4 mg/dL    Comment: Performed at [facility], [address], [place], [place] [address]  CBC     Status: Abnormal   Collection Time: 05/04/24  3:41 AM  Result Value Ref Range   WBC 12.4 (H) 4.0 - 10.5 K/uL   RBC 3.08 (L) 4.22 - 5.81 MIL/uL   Hemoglobin 9.1 (L) 13.0 - 17.0 g/dL   HCT 71.9 (L) 60.9 - 47.9 %   MCV 90.9 80.0 - 100.0 fL   MCH 29.5 26.0 - 34.0 pg   MCHC 32.5 30.0 - 36.0 g/dL   RDW 82.6 (H) 88.4 - 84.4 %   Platelets 136 (L) 150 - 400 K/uL   nRBC 0.0 0.0 - 0.2 %    Comment: Performed at [facility], [address], [place], [place] [address]    Imaging / Studies: CT ABDOMEN PELVIS W CONTRAST Result Date: 05/03/2024 CLINICAL DATA:  Bladder dysfunction (Ped 0-17y). 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 EXAM: CT ABDOMEN AND PELVIS WITH CONTRAST TECHNIQUE: Multidetector CT imaging of the abdomen and pelvis was performed using the standard protocol following bolus administration of intravenous contrast. RADIATION DOSE REDUCTION: This exam was performed according to the departmental dose-optimization program which includes automated exposure control, adjustment of the mA and/or kV according to patient size and/or use of iterative reconstruction technique. CONTRAST:  75mL OMNIPAQUE  IOHEXOL  350 MG/ML SOLN COMPARISON:  CT abdomen 04/19/2024 FINDINGS: Lower chest: Coronary artery calcification.  No acute abnormality. Hepatobiliary: No focal liver abnormality. Status post  cholecystectomy. No biliary dilatation. Pancreas: Coarse calcifications throughout the parenchyma. No focal lesion. Normal pancreatic contour. No surrounding inflammatory changes. No main pancreatic ductal dilatation. Spleen: Normal in size without focal abnormality. Adrenals/Urinary Tract: No adrenal nodule bilaterally. Bilateral kidneys enhance symmetrically. Persistent severe left hydroureteronephrosis and moderate right hydroureteronephrosis. Renal parenchymal thinning and scarring on the left. Status post cystectomy. Right lower abdominal wall ileal conduit formation (3:81). Stomach/Bowel: Right abdominal lower anterior abdominal wall and ileostomy formation. PO contrast opacifies  the large bowel. Enteric tube with tip and side port within the gastric lumen. Stomach is within normal limits. No evidence of bowel wall thickening or dilatation. Colonic diverticulosis. Appendix appears normal. Vascular/Lymphatic: No abdominal aorta or iliac aneurysm. Moderate atherosclerotic plaque of the aorta and its branches. No abdominal, pelvic, or inguinal lymphadenopathy. Reproductive: Prostatectomy. Other: Bilateral lower abdominal wall surgical drains again noted with tips terminating within the right lower quadrant. Interval development of an organized fluid collection measuring 4.3 x 3.6 cm within the right anterolateral abdominal musculature with finding that appears to be contiguous with the peritoneum (3:46). Associated free fluid along the right lateral anterior abdomen that appears to possibly be originating from the ileum where ileal bowel sutures are noted within the right abdomen (3:68). Question fistulization and dehiscence of the ileal bowel sutures with the right abdomen of soft tissues (3:69). Associated free fluid along the right lateral anterior abdomen with extension into the soft tissue and fistulization not excluded between the abscess and the dermis (3:60). No free gas. Musculoskeletal: Possible  developing supraumbilical tiny ventral hernia containing fluid. No suspicious lytic or blastic osseous lesions. No acute displaced fracture. IMPRESSION: 1. Interval development of an organized fluid collection measuring 4.3 x 3.6 cm within the right anterolateral abdominal musculature with finding that appears to be contiguous with the peritoneum. Extensive inflammatory changes along the abdomen and abdominal soft tissues on the right. Question fistulization and dehiscence of the ileal bowel sutures with the right abdomen of soft tissues. 2. Ileal conduit appears to course adjacent the inflammatory changes with no urothelial thickening to suggest superimposed renal infection. 3. Possible developing supraumbilical tiny ventral hernia containing fluid. 4. Chronic pancreatitis with no findings of acute pancreatitis. 5. Colonic diverticulosis with no acute diverticulitis. 6. 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 7. Persistent severe left hydroureteronephrosis and moderate right hydroureteronephrosis. 8.  Aortic Atherosclerosis (ICD10-I70.0). Electronically Signed   By: Morgane  Naveau M.D.   On: 05/03/2024 16:42       Medications / Allergies: per chart  Antibiotics: Anti-infectives (From admission, onward)    Start     Dose/Rate Route Frequency Ordered Stop   05/01/24 1400  piperacillin -tazobactam (ZOSYN ) IVPB 2.25 g        2.25 g 100 mL/hr over 30 Minutes Intravenous Every 8 hours 05/01/24 0957     05/01/24 1045  micafungin  (MYCAMINE ) 100 mg in sodium chloride  0.9 % 100 mL IVPB        100 mg 105 mL/hr over 1 Hours Intravenous Every 24 hours 05/01/24 0949     04/29/24 1000  micafungin  (MYCAMINE ) 150 mg in sodium chloride  0.9 % 100 mL IVPB  Status:  Discontinued        150 mg 107.5 mL/hr over 1 Hours Intravenous Every 24 hours 04/28/24 1036 05/01/24 0949   04/28/24 1345  micafungin  (MYCAMINE ) 100 mg in sodium chloride  0.9 % 100 mL  IVPB        100 mg 105 mL/hr over 1 Hours Intravenous  Once 04/28/24 1245 04/28/24 1354   04/28/24 1000  micafungin  (MYCAMINE ) 100 mg in sodium chloride  0.9 % 100 mL IVPB  Status:  Discontinued        100 mg 105 mL/hr over 1 Hours Intravenous Every 24 hours 04/27/24 1219 04/28/24 1036   04/21/24 1400  clindamycin  (CLEOCIN ) 900 mg, gentamicin  (GARAMYCIN ) 240 mg in sodium chloride  0.9 % 1,000 mL for intraperitoneal lavage  Status:  Discontinued  Irrigation To Surgery 04/21/24 1346 04/21/24 1618   04/20/24 1400  piperacillin -tazobactam (ZOSYN ) IVPB 3.375 g  Status:  Discontinued        3.375 g 12.5 mL/hr over 240 Minutes Intravenous Every 8 hours 04/20/24 0755 05/01/24 0957   04/20/24 1000  metroNIDAZOLE  (FLAGYL ) IVPB 500 mg  Status:  Discontinued        500 mg 100 mL/hr over 60 Minutes Intravenous Every 12 hours 04/20/24 0320 04/20/24 0752   04/20/24 1000  micafungin  (MYCAMINE ) 150 mg in sodium chloride  0.9 % 100 mL IVPB  Status:  Discontinued        150 mg 107.5 mL/hr over 1 Hours Intravenous Every 24 hours 04/20/24 0752 04/27/24 1219   04/20/24 0245  ceFEPIme  (MAXIPIME ) 2 g in sodium chloride  0.9 % 100 mL IVPB  Status:  Discontinued        2 g 200 mL/hr over 30 Minutes Intravenous Every 8 hours 04/20/24 0232 04/20/24 0752   04/20/24 0100  metroNIDAZOLE  (FLAGYL ) IVPB 500 mg        500 mg 100 mL/hr over 60 Minutes Intravenous On call to O.R. 04/20/24 0004 04/20/24 0100   04/14/24 1800  ceFEPIme  (MAXIPIME ) 2 g in sodium chloride  0.9 % 100 mL IVPB        2 g 200 mL/hr over 30 Minutes Intravenous Every 8 hours 04/14/24 1003 04/19/24 0957   04/14/24 1600  vancomycin  (VANCOCIN ) IVPB 1000 mg/200 mL premix  Status:  Discontinued        1,000 mg 200 mL/hr over 60 Minutes Intravenous Every 24 hours 04/13/24 1420 04/14/24 1000   04/14/24 1600  vancomycin  (VANCOREADY) IVPB 1500 mg/300 mL  Status:  Discontinued        1,500 mg 150 mL/hr over 120 Minutes Intravenous Every 24 hours 04/14/24  1002 04/16/24 0744   04/14/24 1200  vancomycin  (VANCOCIN ) IVPB 1000 mg/200 mL premix  Status:  Discontinued        1,000 mg 200 mL/hr over 60 Minutes Intravenous Every 24 hours 04/13/24 1053 04/13/24 1109   04/13/24 2200  ceFEPIme  (MAXIPIME ) 2 g in sodium chloride  0.9 % 100 mL IVPB  Status:  Discontinued        2 g 200 mL/hr over 30 Minutes Intravenous Every 12 hours 04/13/24 1036 04/13/24 1109   04/13/24 2200  ceFEPIme  (MAXIPIME ) 2 g in sodium chloride  0.9 % 100 mL IVPB  Status:  Discontinued        2 g 200 mL/hr over 30 Minutes Intravenous Every 12 hours 04/13/24 1425 04/14/24 1003   04/13/24 1500  vancomycin  (VANCOCIN ) 2,000 mg in sodium chloride  0.9 % 500 mL IVPB        2,000 mg 260 mL/hr over 120 Minutes Intravenous  Once 04/13/24 1409 04/13/24 1850   04/13/24 1500  ceFEPIme  (MAXIPIME ) 2 g in sodium chloride  0.9 % 100 mL IVPB  Status:  Discontinued        2 g 200 mL/hr over 30 Minutes Intravenous Every 12 hours 04/13/24 1420 04/13/24 1425   04/13/24 1500  metroNIDAZOLE  (FLAGYL ) IVPB 500 mg  Status:  Discontinued        500 mg 100 mL/hr over 60 Minutes Intravenous Every 12 hours 04/13/24 1420 04/17/24 0725   04/13/24 1200  vancomycin  (VANCOCIN ) 2,000 mg in sodium chloride  0.9 % 500 mL IVPB  Status:  Discontinued        2,000 mg 260 mL/hr over 120 Minutes Intravenous  Once 04/13/24 1052 04/13/24 1121   04/13/24 1100  metroNIDAZOLE  (FLAGYL ) IVPB 500 mg  Status:  Discontinued        500 mg 100 mL/hr over 60 Minutes Intravenous 2 times daily 04/13/24 1007 04/13/24 1109   04/13/24 1100  vancomycin  (VANCOCIN ) IVPB 1000 mg/200 mL premix  Status:  Discontinued        1,000 mg 200 mL/hr over 60 Minutes Intravenous  Once 04/13/24 1007 04/13/24 1020   04/13/24 1015  ceFEPIme  (MAXIPIME ) 2 g in sodium chloride  0.9 % 100 mL IVPB        2 g 200 mL/hr over 30 Minutes Intravenous STAT 04/13/24 1007 04/14/24 1721   04/10/24 2200  ceFAZolin  (ANCEF ) IVPB 2g/100 mL premix        2 g 200 mL/hr over 30  Minutes Intravenous Every 8 hours 04/10/24 1833 04/11/24 0531   04/10/24 0600  ceFAZolin  (ANCEF ) IVPB 2g/100 mL premix        2 g 200 mL/hr over 30 Minutes Intravenous On call to O.R. 04/10/24 0533 04/10/24 1526         Note: Portions of this report may have been transcribed using voice recognition software. Every effort was made to ensure accuracy; however, inadvertent computerized transcription errors may be present.   Any transcriptional errors that result from this process are unintentional.    Elspeth KYM Schultze, MD, FACS, MASCRS Esophageal, Gastrointestinal & Colorectal Surgery Robotic and Minimally Invasive Surgery  [facility] [address], [address] [place], [place] [address] [phone] Fax [phone] Main  CONTACT INFORMATION: Weekday (9AM-5PM): Call [facility] main office at [phone] Weeknight (5PM-9AM) or Weekend/Holiday: Check EPIC Web Links tab & use AMION (password  [email or web address]) for General Surgery [facility] coverage  Please, DO NOT use SecureChat  (it is not reliable communication to reach operating surgeons & will lead to a delay in care).   Epic staff messaging available for outptient concerns needing 1-2 business day response.      05/04/2024  7:10 AM

## 2024-05-04 NOTE — Progress Notes (Addendum)
 PHARMACY - ANTICOAGULATION CONSULT NOTE  Pharmacy Consult for Heparin  Indication: h/o PE  Allergies  Allergen Reactions   Demerol  [Meperidine ] Nausea And Vomiting and Other (See Comments)    SEVERE NAUSEA    Patient Measurements: Height: 5' 11 (180.3 cm) Weight: 100.2 kg (220 lb 14.4 oz) IBW/kg (Calculated) : 75.3 HEPARIN  DW (KG): 100.8  Vital Signs: Temp: 97.6 F (36.4 C) (06/23 0800) Temp Source: Oral (06/23 0800) BP: 113/81 (06/23 0830) Pulse Rate: 120 (06/23 0830)  Labs: Recent Labs    05/01/24 1313 05/02/24 0515 05/03/24 0453 05/04/24 0341  HGB 8.2*  --   --  9.1*  HCT 26.3*  --   --  28.0*  PLT 111*  --   --  136*  HEPARINUNFRC  --  0.53 0.54 0.58  CREATININE  --  2.96* 3.60* 3.86*    Estimated Creatinine Clearance: 21.2 mL/min (A) (by C-G formula based on SCr of 3.86 mg/dL (H)).   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   moderate atypia - right low back   Atypical nevus 04/04/2007   moderate to marked - right upper back (wider shave)   Atypical nevus 04/04/2007   moderate atypia - center chest (wider shave)   Atypical nevus 04/04/2007   slight atypia - right thigh   Atypical nevus 11/29/2011   mild atypia - center upper back   Atypical nevus 11/29/2011   mild atypia - center chest   Bacteremia due to Klebsiella pneumoniae 10/09/2017   Bladder cancer (HCC) dx 07/2017   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 denies   Sleep apnea    cpap   Squamous cell carcinoma of skin 05/22/2013   left cheek - CX3 + 5FU   Wears glasses     Assessment:  Patient with prolonged hospitalization including post-op complications resulting in bowel perforation with open abdomen. Has been closed and transferred to [facility]. On Eliquis PTA for hx of PE. Has been on heparin  gtt since 6/6. Nephro started ESA + IV iron  x1, 6/21 Fe 54, TSAT 19, Ferr 588  AM heparin  level therapeutic at 0.58, HgB 9.1 and PLTs 136. Had some bleeding in ileostomy on 6/20 but no additional noted.   Goal of Therapy:  Heparin  level 0.3-0.7 units/ml Monitor platelets by anticoagulation protocol: Yes   Plan:  Heparin  held this morning for nephrostomy tube placement.  Spoke with IR can resume heparin  6-8 hours after procedure. Will restart heparin  1900u/hr, patient has been therapeutic on for > 7days.  Monitor for s/sx of bleeding.  Heparin  level daily F/u in next few days about ability to transition back to DOAC. Awaiting improvement in renal fxn and mental status. Some discussion about potential need for PEG  pending patient trajectory.   Powell Blush, PharmD, BCCCP  05/04/24 8:47 AM

## 2024-05-04 NOTE — Progress Notes (Signed)
 Patient ID: DEMAREON COLDWELL, male   DOB: [birth year], 73 y.o.   MRN: [id number] S: developed hypotension and tachycardia while on HD.  UF goal decreased O:BP 127/73   Pulse (!) 116   Temp 97.6 F (36.4 C) (Oral)   Resp 20   Ht 5' 11 (1.803 m)   Wt 100.2 kg   SpO2 100%   BMI 30.81 kg/m   Intake/Output Summary (Last 24 hours) at 05/04/2024 0915 Last data filed at 05/04/2024 0800 Gross per 24 hour  Intake 2956.24 ml  Output 4735 ml  Net -1778.76 ml   Intake/Output: I/O last 3 completed shifts: In: 4583.2 [I.V.:663.4; Other:65; NG/GT:3500; IV Piggyback:354.9] Out: 6513 [Urine:28; Drains:5365; Stool:1120]  Intake/Output this shift:  Total I/O In: 136.9 [I.V.:36.9; NG/GT:100] Out: -  Weight change: -5.6 kg Gen: fatigued, ill-appearing. CVS: Tachy Resp: CTA Abd: +BS, soft, NT/ND, ostomy in place in RLQ and wound vac as well as JP drains Ext: no edema  Recent Labs  Lab 04/29/24 1600 04/30/24 0330 04/30/24 1611 05/01/24 0417 05/02/24 0515 05/03/24 0453 05/04/24 0341  NA 139 135 135 135 135 132* 133*  K 4.5 4.7 4.9 4.9 5.3* 5.4* 5.0  CL 104 104 105 105 102 101 99  CO2 25 23 23 25 23  20* 21*  GLUCOSE 120* 171* 145* 159* 123* 170* 166*  BUN 66* 50* 49* 38* 69* 96* 109*  CREATININE 1.97* 1.67* 1.58* 1.58* 2.96* 3.60* 3.86*  ALBUMIN  2.1* 2.0* 2.1* 2.0* 2.2* 2.3* 2.4*  CALCIUM  8.1* 7.8* 7.9* 7.9* 8.6* 8.4* 9.2  PHOS 3.2 2.7 2.4* 2.2* 5.1* 6.5* 7.2*   Liver Function Tests: Recent Labs  Lab 05/02/24 0515 05/03/24 0453 05/04/24 0341  ALBUMIN  2.2* 2.3* 2.4*   No results for input(s): LIPASE, AMYLASE in the last 168 hours. No results for input(s): AMMONIA in the last 168 hours. CBC: Recent Labs  Lab 04/29/24 0355 04/30/24 0330 05/01/24 0417 05/01/24 1313 05/04/24 0341  WBC 12.9* 14.5* 13.6* 16.4* 12.4*  HGB 8.4* 8.4* 7.9* 8.2* 9.1*  HCT 26.5* 26.5* 25.1* 26.3* 28.0*  MCV 89.2 91.4 90.6 92.0 90.9  PLT 305 217 135* 111* 136*   Cardiac Enzymes: No results  for input(s): CKTOTAL, CKMB, CKMBINDEX, TROPONINI in the last 168 hours. CBG: Recent Labs  Lab 05/03/24 1542 05/03/24 1939 05/03/24 2315 05/04/24 0339 05/04/24 0746  GLUCAP 125* 95 120* 159* 161*    Iron  Studies:  Recent Labs    05/02/24 0515  IRON  54  TIBC 281  FERRITIN 588*   Studies/Results: CT ABDOMEN PELVIS W CONTRAST Result Date: 05/03/2024 CLINICAL DATA:  Bladder dysfunction (Ped 0-17y). 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 EXAM: CT ABDOMEN AND PELVIS WITH CONTRAST TECHNIQUE: Multidetector CT imaging of the abdomen and pelvis was performed using the standard protocol following bolus administration of intravenous contrast. RADIATION DOSE REDUCTION: This exam was performed according to the departmental dose-optimization program which includes automated exposure control, adjustment of the mA and/or kV according to patient size and/or use of iterative reconstruction technique. CONTRAST:  75mL OMNIPAQUE  IOHEXOL  350 MG/ML SOLN COMPARISON:  CT abdomen 04/19/2024 FINDINGS: Lower chest: Coronary artery calcification.  No acute abnormality. Hepatobiliary: No focal liver abnormality. Status post cholecystectomy. No biliary dilatation. Pancreas: Coarse calcifications throughout the parenchyma. No focal lesion. Normal pancreatic contour. No surrounding inflammatory changes. No main pancreatic ductal dilatation. Spleen: Normal in size without focal abnormality. Adrenals/Urinary Tract: No adrenal nodule bilaterally. Bilateral kidneys enhance symmetrically. Persistent severe left hydroureteronephrosis and moderate right hydroureteronephrosis. Renal  parenchymal thinning and scarring on the left. Status post cystectomy. Right lower abdominal wall ileal conduit formation (3:81). Stomach/Bowel: Right abdominal lower anterior abdominal wall and ileostomy formation. PO contrast opacifies the large bowel. Enteric tube with tip and side port within the gastric lumen. Stomach  is within normal limits. No evidence of bowel wall thickening or dilatation. Colonic diverticulosis. Appendix appears normal. Vascular/Lymphatic: No abdominal aorta or iliac aneurysm. Moderate atherosclerotic plaque of the aorta and its branches. No abdominal, pelvic, or inguinal lymphadenopathy. Reproductive: Prostatectomy. Other: Bilateral lower abdominal wall surgical drains again noted with tips terminating within the right lower quadrant. Interval development of an organized fluid collection measuring 4.3 x 3.6 cm within the right anterolateral abdominal musculature with finding that appears to be contiguous with the peritoneum (3:46). Associated free fluid along the right lateral anterior abdomen that appears to possibly be originating from the ileum where ileal bowel sutures are noted within the right abdomen (3:68). Question fistulization and dehiscence of the ileal bowel sutures with the right abdomen of soft tissues (3:69). Associated free fluid along the right lateral anterior abdomen with extension into the soft tissue and fistulization not excluded between the abscess and the dermis (3:60). No free gas. Musculoskeletal: Possible developing supraumbilical tiny ventral hernia containing fluid. No suspicious lytic or blastic osseous lesions. No acute displaced fracture. IMPRESSION: 1. Interval development of an organized fluid collection measuring 4.3 x 3.6 cm within the right anterolateral abdominal musculature with finding that appears to be contiguous with the peritoneum. Extensive inflammatory changes along the abdomen and abdominal soft tissues on the right. Question fistulization and dehiscence of the ileal bowel sutures with the right abdomen of soft tissues. 2. Ileal conduit appears to course adjacent the inflammatory changes with no urothelial thickening to suggest superimposed renal infection. 3. Possible developing supraumbilical tiny ventral hernia containing fluid. 4. Chronic pancreatitis with  no findings of acute pancreatitis. 5. Colonic diverticulosis with no acute diverticulitis. 6. 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 7. Persistent severe left hydroureteronephrosis and moderate right hydroureteronephrosis. 8.  Aortic Atherosclerosis (ICD10-I70.0). Electronically Signed   By: Morgane  Naveau M.D.   On: 05/03/2024 16:42    Chlorhexidine  Gluconate Cloth  6 each Topical Daily   Chlorhexidine  Gluconate Cloth  6 each Topical Q0600   darbepoetin (ARANESP ) injection - DIALYSIS  200 mcg Subcutaneous Q Fri-1800   feeding supplement (PROSource TF20)  60 mL Per Tube BID   feeding supplement (VITAL 1.5 CAL)  1,440 mL Per Tube Q24H   insulin  aspart  0-20 Units Subcutaneous Q4H   insulin  aspart  6 Units Subcutaneous 4 times per day   insulin  glargine-yfgn  25 Units Subcutaneous QHS   loperamide  HCl  2 mg Per Tube QHS   nutrition supplement (JUVEN)  1 packet Per Tube BID BM   mouth rinse  15 mL Mouth Rinse 4 times per day   polycarbophil  625 mg Per Tube BID   sodium zirconium cyclosilicate   10 g Per Tube BID    BMET    Component Value Date/Time   NA 133 (L) 05/04/2024 0341   K 5.0 05/04/2024 0341   CL 99 05/04/2024 0341   CO2 21 (L) 05/04/2024 0341   GLUCOSE 166 (H) 05/04/2024 0341   BUN 109 (H) 05/04/2024 0341   CREATININE 3.86 (H) 05/04/2024 0341   CALCIUM  9.2 05/04/2024 0341   GFRNONAA 16 (L) 05/04/2024 0341   GFRAA >60 12/09/2018 0549   CBC  Component Value Date/Time   WBC 12.4 (H) 05/04/2024 0341   RBC 3.08 (L) 05/04/2024 0341   HGB 9.1 (L) 05/04/2024 0341   HCT 28.0 (L) 05/04/2024 0341   PLT 136 (L) 05/04/2024 0341   MCV 90.9 05/04/2024 0341   MCH 29.5 05/04/2024 0341   MCHC 32.5 05/04/2024 0341   RDW 17.3 (H) 05/04/2024 0341   LYMPHSABS 0.8 04/21/2024 1715   MONOABS 0.9 04/21/2024 1715   EOSABS 0.0 04/21/2024 1715   BASOSABS 0.1 04/21/2024 1715    Assessment/ Plan: Pt is a 73 y.o. yo male   with  hypertension, DM, CAD, hx bladder cancer, CHF, right bundle branch block who had acute bowel perforation and status post OR for bowel repair on 6/8, lysis of adhesion, ileal resection end ileostomy hernia repairs, septic shock seen for AKI and oliguria.    # Acute kidney injury, oliguric with anasarca likely ischemic ATN due to septic shock and obstructive uropathy given bilateral hydronephrosis.  CRRT 6/11-6/14 -  Because of the patient's persistent altered mental status and high metabolic state with elevated BUN was put back on CRRT on 6/17- 6/20 -  then taken off- with rising BUN and crt .  Plan for intermittent HD today but developed tachycardia and hypotension with UF.  His temp cath is approaching time for replacement -  has been in 10 days and will need new HD catheter and possible tunneled if not improvement of renal function.   # Bilateral hydronephrosis severe on the left side: S/p revision of ileal conduit 5/30,  urology following. Now no UOP brings to mind possible disruption-  checking surgical drain output for crt -  seemed to be indicative of leak-  have talked to GU-  plan for bilateral percutaneous nephrostomy tube placement today by IR.   # Acute bowel perforation is status post bowel repair ileal conduit and hernia repairs urostomy ileal conduit revision.  As per surgical team.    # HTN: Continue current medications and adjust volume status with dialysis as tolerated   # Acute hypoxic respiratory failure/VDRF per pulmonary team. Extubated 6/14, currently on nasal cannula   # Altered mental status: Likely multifactorial.  Uremia may have played some role.  Improving slowly -  a little more somnolent today - uremia may have something to do with it    Anemia-  added ESA -  iron  stores OK   Hyperk-  give lokelma - and follow after HD and PCN placement.  Fairy RONAL Sellar, MD [facility]

## 2024-05-04 NOTE — Progress Notes (Signed)
 eLink Physician-Brief Progress Note Patient Name: Kristopher Thompson DOB: 08/06/1951 MRN: [id number]   Date of Service  05/04/2024  HPI/Events of Note  Has ongoing itching.  Previously received Benadryl  and Atarax but these have been discontinued.  eICU Interventions  Add Benadryl  for limited doses for itching     Intervention Category Minor Interventions: Routine modifications to care plan (e.g. PRN medications for pain, fever)  Maude Gloor 05/04/2024, 10:18 PM

## 2024-05-04 NOTE — Progress Notes (Signed)
   05/04/24 1030  Vitals  Temp (!) 97.5 F (36.4 C)  Pulse Rate (!) 101  Resp 14  BP 120/69  SpO2 100 %  Weight  (unable to obtain)  Post Treatment  Dialyzer Clearance Lightly streaked  Hemodialysis Intake (mL) 100 mL  Liters Processed 63.4  Fluid Removed (mL) 0 mL  Tolerated HD Treatment Yes   Received patient in bed to unit.  Alert and oriented.  Informed consent signed and in chart.   TX duration:3.5hrs  Patient tolerated well.   Alert, without acute distress.  Hand-off given to patient's nurse.   Access used: [facility] Access issues: none  Total UF removed: not met due to change in status at beginning of HD tx Medication(s) given: none    [facility]

## 2024-05-04 NOTE — Progress Notes (Signed)
 SLP Cancellation Note  Patient Details Name: Kristopher Thompson MRN: [id number] DOB: 08-28-1951   Cancelled treatment:       Reason Eval/Treat Not Completed: Patient at procedure or test/unavailable. Pt currently in HD and is NPO for a subsequent procedure. SLP will continue following.    Damien Blumenthal, M.A., CCC-SLP Speech Language Pathology, Acute Rehabilitation Services  Secure Chat preferred [phone]  05/04/2024, 9:22 AM

## 2024-05-05 DIAGNOSIS — E119 Type 2 diabetes mellitus without complications: Secondary | ICD-10-CM | POA: Diagnosis not present

## 2024-05-05 DIAGNOSIS — N179 Acute kidney failure, unspecified: Secondary | ICD-10-CM | POA: Diagnosis not present

## 2024-05-05 DIAGNOSIS — G9341 Metabolic encephalopathy: Secondary | ICD-10-CM | POA: Diagnosis not present

## 2024-05-05 DIAGNOSIS — K631 Perforation of intestine (nontraumatic): Secondary | ICD-10-CM | POA: Diagnosis not present

## 2024-05-05 LAB — GLUCOSE, CAPILLARY
Glucose-Capillary: 111 mg/dL — ABNORMAL HIGH (ref 70–99)
Glucose-Capillary: 121 mg/dL — ABNORMAL HIGH (ref 70–99)
Glucose-Capillary: 131 mg/dL — ABNORMAL HIGH (ref 70–99)
Glucose-Capillary: 146 mg/dL — ABNORMAL HIGH (ref 70–99)
Glucose-Capillary: 179 mg/dL — ABNORMAL HIGH (ref 70–99)
Glucose-Capillary: 182 mg/dL — ABNORMAL HIGH (ref 70–99)

## 2024-05-05 LAB — HEPARIN LEVEL (UNFRACTIONATED): Heparin Unfractionated: 0.43 [IU]/mL (ref 0.30–0.70)

## 2024-05-05 LAB — RENAL FUNCTION PANEL
Albumin: 2.5 g/dL — ABNORMAL LOW (ref 3.5–5.0)
Anion gap: 11 (ref 5–15)
BUN: 72 mg/dL — ABNORMAL HIGH (ref 8–23)
CO2: 26 mmol/L (ref 22–32)
Calcium: 8.9 mg/dL (ref 8.9–10.3)
Chloride: 97 mmol/L — ABNORMAL LOW (ref 98–111)
Creatinine, Ser: 2.53 mg/dL — ABNORMAL HIGH (ref 0.61–1.24)
GFR, Estimated: 26 mL/min — ABNORMAL LOW (ref >60)
Glucose, Bld: 184 mg/dL — ABNORMAL HIGH (ref 70–99)
Phosphorus: 5.2 mg/dL — ABNORMAL HIGH (ref 2.5–4.6)
Potassium: 4.1 mmol/L (ref 3.5–5.1)
Sodium: 134 mmol/L — ABNORMAL LOW (ref 135–145)

## 2024-05-05 LAB — MAGNESIUM: Magnesium: 2 mg/dL (ref 1.7–2.4)

## 2024-05-05 NOTE — Progress Notes (Signed)
 05/05/2024  [facility] [id number]  CARE TEAM: PCP: [name], MD  Outpatient Care Team: Patient Care Team: [name], MD as PCP - General (Internal Medicine) [name], MD (Inactive) as Consulting Physician (Dermatology) [name], MD as Referring Physician (Pulmonary Disease) [name], MD as Consulting Physician (General Surgery) [name], MD as Consulting Physician (Urology)  Inpatient Treatment Team: Treatment Team:  [name], MD [name], MD [name], MD Pccm, Md, MD Dolan Mateo Larger, MD Massie Delaine SAUNDERS, RN Norine Manuelita DELENA, MD Dea Shiner, MD [facility] Radiology, MD Glendia Dena BIRCH, RN Joshua Josepha SAILOR, RN Cheryn Earla DELENA, RN Sharl Lesley SAUNDERS, NT Reyna-Nieblas, [place], RN Koonce, Natalie K, OT Prater, Lenoard NOVAK, PT [place], [place], [place] Hadassah Rankin PARAS, RN   Problem List:   Principal Problem:   Delayed bowel perforation s/p SBR/end ileostomy Active Problems:   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 08/08/2017   S/P ileal conduit (HCC)   GERD (gastroesophageal reflux disease)   Obesity (BMI 35.0-39.9 without comorbidity)   Prolonged QT interval   Non-recurrent bilateral inguinal hernia without obstruction or gangrene   Incarcerated incisional hernia   Sinus tachycardia   Tachypnea   Acute respiratory insufficiency, postoperative   Sepsis due to undetermined organism (HCC)   Lactic acidosis   Class 2 obesity   Chronic anticoagulation   Hearing loss   History of bladder cancer   Obstructive sleep apnea of adult   Parastomal hernia of ileal conduit   Partial small bowel obstruction (HCC)   Personal history of PE (pulmonary embolism)   Pressure injury of skin   04/10/2024  POST-OPERATIVE DIAGNOSIS:  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   PROCEDURE:   -ROBOTIC LYSIS OF ADHESIONS X 4 HOURS -COMPONENT SEPARATION -  TRANSVERSUS ABDOMINIS REALEASE (TAR) BILATERAL -ROBOTIC REPAIRS OF  INCISIONAL, PARASTOMAL, LEFT INGUINAL  INCARCERATED ABDOMINAL WALL HERNIAS WITH MESH -UROSTOMY ILEAL CONDUIT REVISION -INTRAOPERATIVE ASSESSMENT OF TISSUE VASCULAR PERFUSION USING ICG (indocyanine green ) -IMMUNOFLUORESCENCE -TRANSVERSUS ABDOMINIS PLANE (TAP) BLOCK - BILATERAL    SURGEON:  [name], MD   04/20/2024 POST-OPERATIVE DIAGNOSIS:  perforated bowel   PROCEDURE:  Drainage of right abdominal wall abscess as well as intra-abdominal abscess Explantation of abdominal mesh Small bowel resection (in discontinuity) Placement of ABThera wound VAC   SURGEON:  [name], MD  04/21/2024  POST-OPERATIVE DIAGNOSIS:  DELAYED BOWEL PERFORATION WITH OPEN ABDOMEN   PROCEDURE:   LYSIS OF ADHESIONS X ABDOMINAL WALL DEBRIDEMENT ILEAL RESECTION END ILEOSTOMY ABDOMINAL WALL PARASTOMAL & INCISIONAL HERNIA REPAIRS WITH PHASIX MESH FASCIA CLOSURE WITH WOUND VAC PLACEMENT IN SQ   SURGEON:  [name], MD    05/04/2024 Interventional Radiology Procedure Note   Procedure: Bilateral percutaneous nephrostomy tube placements   Findings: Please refer to procedural dictation for full description. 10 Fr bilateral to bag drainage.   Complications: None immediate   Estimated Blood Loss: < 5 ml   Recommendations: Keep to bag drainage.  IR will follow.  Ultimate management as per Urology.   Assessment Interventional Radiology Procedure Note   Procedure: Bilateral percutaneous nephrostomy tube placements   Findings: Please refer to procedural dictation for full description. 10 Fr bilateral to bag drainage.   Complications: None immediate   Estimated Blood Loss: < 5 ml   Recommendations: Keep to bag drainage.  IR will follow.  Ultimate management as per Urology.[facility] Stay = 25 days) 14 Days Post-Op    Delayed urine leak  Plan:  Increase clear drain output from both abdominal wall and  intraperitoneal drains with high creatinine consistent with leak at urine ileal conduit.    Discussed with Dr. Renda with urology.  Both of us  review CT scan.  There is no evidence of any mucosal separation of the skin and the ostomy is viable and pink.  Dr. Renda is skeptical that there is a leak at the ureters or at the uretero-ileal anastomosis.  Therefore it must be in the middle of the urostomy segment.  Continue drainage with abdominal wall and intraperitoneal drains -May be able to pull the intraperitoneal one and just leave the abdominal wall and only if output tapers off.  We will see.  Perc drainage 6/23 - bilateral nephrostomy tubes  Nutrition:  Cycling tube feeds.  Tolerating 121mL/hour x 14 hours during evening/night.  Fiber to thicken ileostomy effluent -soluble polyfiber/FiberCon  Add loperamide  to avoid too much high output ileostomy.  Weaned off TPN 6/16   PO as tolerated.  Patient more alert and talking today.  Tolerated a few bites of pured food.  Try and see if we can do a soft diet.  Hopefully can gradually advance and start weaning of tube feeds over the next week.  We will see.  See if Speech therapy agrees.  Hold off on G-tube unless poor PO effort >14days  Infection due to delayed bowel perforation and abdominal wall infection/necrosis  Growing multiple organisms including Candida albicans, Enterobacter, and Enterococcus.    IV Zosyn /micafungin .  Anticipate prolonged antibiotics given the abdominal wall infection/necrosis and need for Phasix mesh.   Change wound vac q MonWedFri the hopes for the subcutaneous tissues to recover.  Last change 6/20 showing early granulation without necrosis guardedly reassuring.      Metabolic encephalopathy with all the stresses he has been through, especially Uremia.  Until he he is close to back to his baseline talking.  Usually tends to be stoic and not particular talkative.  Much improved overall.  Wife agrees.   CT of  chest negative for any obvious pulmonary embolism or pneumonia.  Oxygen needs less.  Echocardiogram shows grade 1 diastolic dysfunction which I believe is his baseline.  Defer to critical care/internal medicine they feel further is needed aside for some monitor diuresis.  ABLA on top of anemia of chronic disease improved with transfusion.  Follow.    -monitor electrolytes & replace as needed.  Keep K>4, Mg>2, Phos>3.    -Diabetes.  Sliding scale insulin .    -VTE prophylaxis-  Full anticoagulation heparin  drip given history of pulmonary embolism,.  Could consider back on DOAC enteral route once off CRRTx  -Mobilize as tolerated.  Agree with critical care about having physical and Occupational Therapy get more involved now that he is more alert.  Hopefully can rebuild strength and minimize further atrophy.  Tentatively saying FL 2 discussion about rehab.  I do not want this patient going to an LTAC.  He is not ready.  I updated the patient's status to the the patient & ICU RN in room.  Discussed with Dr. Annella with critical care.  Recommendations were made.  Questions were answered.  They expressed understanding & appreciation.  -Disposition: He is going to be here a while       I reviewed nursing notes, last 24 h vitals and pain scores, last 48 h intake and output, last 24 h labs and trends, and last 24 h imaging results.  I have reviewed this patient's available  data, including medical history, events of note, test results, etc as part of my evaluation.   A significant portion of that time was spent in counseling. Care during the described time interval was provided by me.  This care required high  level of medical decision making.  05/05/2024    Subjective: (Chief complaint) Percutaneous nephrostomy tubes placed.  Patient much more alert and talkative today.  Wife and ICU nurse in room.  Dr. Annella with pulmonary critical care and ICU team just outside room.  Patient  denies any abdominal pain  Objective:  Vital signs:  Vitals:   05/05/24 0400 05/05/24 0500 05/05/24 0600 05/05/24 0717  BP: 115/75 109/68 104/70   Pulse: (!) 111 (!) 107 (!) 106   Resp: (!) 22 18 15    Temp:    97.9 F (36.6 C)  TempSrc:    Axillary  SpO2: 95% 96% 96%   Weight:      Height:        Last BM Date : 05/05/24  Intake/Output   Yesterday:  06/23 0701 - 06/24 0700 In: 1791.6 [I.V.:198.3; WH/HU:8661.6; IV Piggyback:255] Out: 2375 [Urine:550; Drains:1375; Stool:450] This shift:  No intake/output data recorded.  Bowel function:  Flatus: YES  BM:  YES -small  Drains:  RLQ drain (rests between abdominal wall and Phasix mesh): Serous    LLQ drain (runs over bowel with tip down in RLQ pelvis near base of ileal conduit and site of bowel resection):  thinly serosanguinous   Wound vac (in SQ over closed fascia):  serosanguineous   Nephrostomy tubes in place.  Right light tan cauterant more than left more light yellow-colored  Physical Exam:  General: Awake and alert.  Oriented x 4.  Looked at me and said Copy.  Following commandsNot agitated Eyes: PERRL, normal EOM.  Sclera clear.  No icterus Neuro: CN II-XII intact w/o focal sensory/motor deficits. Lymph: No head/neck/groin lymphadenopathy Psych:  No delerium/psychosis/paranoia.  No agitation HENT: Normocephalic, Mucus membranes moist.  No thrush.  ETT & NGT in place Neck: Supple, No tracheal deviation.  No obvious thyromegaly Chest: No pain to chest wall compression.  Good respiratory excursion.   CV:  Pulses intact.  Regular rhythm.  1-2+ BUE/BLE edema MS:  No obvious deformity  Abdomen:  Obese Soft.  Nondistended.  Wound vac in midline.    Flank ecchymosis bilateral posterior resolving.  Abdominal edema mostly resolved   RUQ: (End ileostomy): Green oatmeal thick effluent in bag RLQ:  (Urostomy ileal conduit): pink mucosa without any obvious separation.  Scant clear light yellow-colored urine  Ext:    No deformity.  Bilateral hand 0-1+ edema proved.  No lower extremity edema.  No cyanosis Skin: No petechiae / purpurea.  No major sores.  Warm and dry    Results:   Cultures: Recent Results (from the past 720 hours)  Culture, blood (Routine X 2) w Reflex to ID Panel     Status: None   Collection Time: 04/13/24  7:13 AM   Specimen: BLOOD  Result Value Ref Range Status   Specimen Description   Final    BLOOD BLOOD LEFT ARM AEROBIC BOTTLE ONLY ANAEROBIC BOTTLE ONLY Performed at [facility], [address] [place], [place] [address]    Special Requests   Final    BOTTLES DRAWN AEROBIC AND ANAEROBIC Blood Culture results may not be optimal due to an inadequate volume of blood received in culture bottles Performed at [facility], [address]  [place], [place] [address]    Culture   Final    NO GROWTH 5 DAYS Performed at [facility], [address] [place], [place] [address]    Report Status 04/18/2024 FINAL  Final  Culture, blood (Routine X 2) w Reflex to ID Panel     Status: None   Collection Time: 04/13/24  7:20 AM   Specimen: BLOOD  Result Value Ref Range Status   Specimen Description   Final    BLOOD BLOOD RIGHT ARM AEROBIC BOTTLE ONLY ANAEROBIC BOTTLE ONLY Performed at [facility], [address] [place], [place] [address]    Special Requests   Final    BOTTLES DRAWN AEROBIC AND ANAEROBIC Blood Culture results may not be optimal due to an inadequate volume of blood received in culture bottles Performed at [facility], [address] [place], [place] [address]    Culture   Final    NO GROWTH 5 DAYS Performed at [facility], [address], [place], [place] [address]    Report Status 04/18/2024 FINAL  Final  Urine Culture     Status: None   Collection Time: 04/13/24 10:18 AM   Specimen: Urine, Clean Catch  Result Value Ref Range Status   Specimen Description   Final     URINE, CLEAN CATCH Performed at [facility], [address], [place], [place] [address]    Special Requests   Final    NONE Performed at [facility], [address] [place], [place] [address]    Culture   Final    NO GROWTH Performed at [facility], [address], [place], [place] [address]    Report Status 04/14/2024 FINAL  Final  MRSA Next Gen by PCR, Nasal     Status: None   Collection Time: 04/13/24 11:35 AM   Specimen: Nasal Mucosa; Nasal Swab  Result Value Ref Range Status   MRSA by PCR Next Gen NOT DETECTED NOT DETECTED Final    Comment: (NOTE) The GeneXpert MRSA Assay (FDA approved for NASAL specimens only), is one component of a comprehensive MRSA colonization surveillance program. It is not intended to diagnose MRSA infection nor to guide or monitor treatment for MRSA infections. Test performance is not FDA approved in patients less than 82 years old. Performed at [facility], [address], [place], [place] [address]   Culture, blood (Routine X 2) w Reflex to ID Panel     Status: None   Collection Time: 04/20/24  8:46 AM   Specimen: BLOOD RIGHT ARM  Result Value Ref Range Status   Specimen Description   Final    BLOOD RIGHT ARM Performed at [facility], [address] [place], [place] [address]    Special Requests   Final    BOTTLES DRAWN AEROBIC AND ANAEROBIC Blood Culture results may not be optimal due to an inadequate volume of blood received in culture bottles Performed at [facility], [address], [place], [place] [address]    Culture   Final    NO GROWTH 5 DAYS Performed at [facility], [address] [place], [place] [address]    Report Status 04/25/2024 FINAL  Final  Culture, blood (Routine X 2) w Reflex to ID Panel     Status: None   Collection Time: 04/20/24  8:49 AM   Specimen: BLOOD  Result Value Ref Range Status  Specimen Description    Final    BLOOD Performed at [facility], [address] [place], [place] [address]    Special Requests   Final    BOTTLES DRAWN AEROBIC AND ANAEROBIC Blood Culture results may not be optimal due to an inadequate volume of blood received in culture bottles Performed at [facility], [address], [place], [place] [address]    Culture   Final    NO GROWTH 5 DAYS Performed at [facility], [address], [place], [place] [address]    Report Status 04/25/2024 FINAL  Final  Culture, Respiratory w Gram Stain     Status: None   Collection Time: 04/20/24 12:03 PM   Specimen: Tracheal Aspirate; Respiratory  Result Value Ref Range Status   Specimen Description   Final    TRACHEAL ASPIRATE Performed at [facility], [address] [place], [place] [address]    Special Requests   Final    NONE Performed at [facility], [address] [place], [place] [address]    Gram Stain NO WBC SEEN RARE GRAM POSITIVE COCCI   Final   Culture   Final    RARE Normal respiratory flora-no Staph aureus or Pseudomonas seen Performed at [facility], [address], [place], [place] [address]    Report Status 04/23/2024 FINAL  Final  Aerobic/Anaerobic Culture w Gram Stain (surgical/deep wound)     Status: None   Collection Time: 04/21/24  2:23 PM   Specimen: Soft Tissue, Other  Result Value Ref Range Status   Specimen Description   Final    TISSUE INTRA ABDOMINAL NECROTIC TISSUE Performed at [facility], [address], [place], [place] [address]    Special Requests   Final    NONE Performed at [facility], [address] [place], [place] [address]    Gram Stain   Final    FEW WBC PRESENT,BOTH PMN AND MONONUCLEAR RARE GRAM POSITIVE COCCI IN PAIRS AND CHAINS RARE YEAST    Culture   Final    RARE ESCHERICHIA COLI FEW CANDIDA ALBICANS FEW ENTEROCOCCUS FAECALIS FEW BACTEROIDES  SPECIES BETA LACTAMASE POSITIVE Performed at [facility], [address] [place], [place] [address]    Report Status 04/25/2024 FINAL  Final   Organism ID, Bacteria ESCHERICHIA COLI  Final   Organism ID, Bacteria ENTEROCOCCUS FAECALIS  Final      Susceptibility   Escherichia coli - MIC*    AMPICILLIN >=32 RESISTANT Resistant     CEFEPIME  <=0.12 SENSITIVE Sensitive     CEFTAZIDIME <=1 SENSITIVE Sensitive     CEFTRIAXONE  1 SENSITIVE Sensitive     CIPROFLOXACIN  <=0.25 SENSITIVE Sensitive     GENTAMICIN  <=1 SENSITIVE Sensitive     IMIPENEM <=0.25 SENSITIVE Sensitive     TRIMETH /SULFA  <=20 SENSITIVE Sensitive     AMPICILLIN/SULBACTAM >=32 RESISTANT Resistant     PIP/TAZO 8 SENSITIVE Sensitive ug/mL    * RARE ESCHERICHIA COLI   Enterococcus faecalis - MIC*    AMPICILLIN <=2 SENSITIVE Sensitive     VANCOMYCIN  1 SENSITIVE Sensitive     GENTAMICIN  SYNERGY SENSITIVE Sensitive     * FEW ENTEROCOCCUS FAECALIS    Labs: Results for orders placed or performed during the hospital encounter of 04/10/24 (from the past 48 hours)  Glucose, capillary     Status: Abnormal   Collection Time: 05/03/24 11:54 AM  Result Value Ref Range  Glucose-Capillary 136 (H) 70 - 99 mg/dL    Comment: Glucose reference range applies only to samples taken after fasting for at least 8 hours.  Glucose, capillary     Status: Abnormal   Collection Time: 05/03/24  3:42 PM  Result Value Ref Range   Glucose-Capillary 125 (H) 70 - 99 mg/dL    Comment: Glucose reference range applies only to samples taken after fasting for at least 8 hours.  Glucose, capillary     Status: None   Collection Time: 05/03/24  7:39 PM  Result Value Ref Range   Glucose-Capillary 95 70 - 99 mg/dL    Comment: Glucose reference range applies only to samples taken after fasting for at least 8 hours.  Glucose, capillary     Status: Abnormal   Collection Time: 05/03/24 11:15 PM  Result Value Ref Range   Glucose-Capillary 120 (H) 70 - 99  mg/dL    Comment: Glucose reference range applies only to samples taken after fasting for at least 8 hours.  Prealbumin     Status: None   Collection Time: 05/04/24 12:00 AM  Result Value Ref Range   Prealbumin 28 18 - 38 mg/dL    Comment: Performed at [facility], [address], [place], [place] [address]  Glucose, capillary     Status: Abnormal   Collection Time: 05/04/24  3:39 AM  Result Value Ref Range   Glucose-Capillary 159 (H) 70 - 99 mg/dL    Comment: Glucose reference range applies only to samples taken after fasting for at least 8 hours.  Heparin  level (unfractionated)     Status: None   Collection Time: 05/04/24  3:41 AM  Result Value Ref Range   Heparin  Unfractionated 0.58 0.30 - 0.70 IU/mL    Comment: (NOTE) The clinical reportable range upper limit is being lowered to >1.10 to align with the FDA approved guidance for the current laboratory assay.  If heparin  results are below expected values, and patient dosage has  been confirmed, suggest follow up testing of antithrombin III levels. Performed at [facility], [address] [place] [address], [place], [place] [address]   Renal function panel (daily at 0500)     Status: Abnormal   Collection Time: 05/04/24  3:41 AM  Result Value Ref Range   Sodium 133 (L) 135 - 145 mmol/L   Potassium 5.0 3.5 - 5.1 mmol/L   Chloride 99 98 - 111 mmol/L   CO2 21 (L) 22 - 32 mmol/L   Glucose, Bld 166 (H) 70 - 99 mg/dL    Comment: Glucose reference range applies only to samples taken after fasting for at least 8 hours.   BUN 109 (H) 8 - 23 mg/dL   Creatinine, Ser 6.13 (H) 0.61 - 1.24 mg/dL   Calcium  9.2 8.9 - 10.3 mg/dL   Phosphorus 7.2 (H) 2.5 - 4.6 mg/dL   Albumin  2.4 (L) 3.5 - 5.0 g/dL   GFR, Estimated 16 (L) >60 mL/min    Comment: (NOTE) Calculated using the CKD-EPI Creatinine Equation (2021)    Anion gap 13 5 - 15    Comment: Performed at [facility], [address], [place], [place] [address]  Magnesium      Status:  None   Collection Time: 05/04/24  3:41 AM  Result Value Ref Range   Magnesium  2.0 1.7 - 2.4 mg/dL    Comment: Performed at [facility], [address] [place], [place] [address]  CBC  Status: Abnormal   Collection Time: 05/04/24  3:41 AM  Result Value Ref Range   WBC 12.4 (H) 4.0 - 10.5 K/uL   RBC 3.08 (L) 4.22 - 5.81 MIL/uL   Hemoglobin 9.1 (L) 13.0 - 17.0 g/dL   HCT 71.9 (L) 60.9 - 47.9 %   MCV 90.9 80.0 - 100.0 fL   MCH 29.5 26.0 - 34.0 pg   MCHC 32.5 30.0 - 36.0 g/dL   RDW 82.6 (H) 88.4 - 84.4 %   Platelets 136 (L) 150 - 400 K/uL   nRBC 0.0 0.0 - 0.2 %    Comment: Performed at [facility], [address], [place], [place] [address]  Glucose, capillary     Status: Abnormal   Collection Time: 05/04/24  7:46 AM  Result Value Ref Range   Glucose-Capillary 161 (H) 70 - 99 mg/dL    Comment: Glucose reference range applies only to samples taken after fasting for at least 8 hours.  Protime-INR     Status: None   Collection Time: 05/04/24 10:20 AM  Result Value Ref Range   Prothrombin Time 14.2 11.4 - 15.2 seconds   INR 1.1 0.8 - 1.2    Comment: (NOTE) INR goal varies based on device and disease states. Performed at [facility], [address], [place], [place] [address]   Glucose, capillary     Status: Abnormal   Collection Time: 05/04/24 11:23 AM  Result Value Ref Range   Glucose-Capillary 129 (H) 70 - 99 mg/dL    Comment: Glucose reference range applies only to samples taken after fasting for at least 8 hours.  Glucose, capillary     Status: Abnormal   Collection Time: 05/04/24  3:49 PM  Result Value Ref Range   Glucose-Capillary 151 (H) 70 - 99 mg/dL    Comment: Glucose reference range applies only to samples taken after fasting for at least 8 hours.  Glucose, capillary     Status: Abnormal   Collection Time: 05/04/24  7:27 PM  Result Value Ref Range   Glucose-Capillary 151 (H) 70 - 99 mg/dL    Comment: Glucose reference range applies only to  samples taken after fasting for at least 8 hours.  Glucose, capillary     Status: Abnormal   Collection Time: 05/04/24 11:18 PM  Result Value Ref Range   Glucose-Capillary 127 (H) 70 - 99 mg/dL    Comment: Glucose reference range applies only to samples taken after fasting for at least 8 hours.  Glucose, capillary     Status: Abnormal   Collection Time: 05/05/24  3:16 AM  Result Value Ref Range   Glucose-Capillary 179 (H) 70 - 99 mg/dL    Comment: Glucose reference range applies only to samples taken after fasting for at least 8 hours.  Renal function panel (daily at 0500)     Status: Abnormal   Collection Time: 05/05/24  4:25 AM  Result Value Ref Range   Sodium 134 (L) 135 - 145 mmol/L   Potassium 4.1 3.5 - 5.1 mmol/L   Chloride 97 (L) 98 - 111 mmol/L   CO2 26 22 - 32 mmol/L   Glucose, Bld 184 (H) 70 - 99 mg/dL    Comment: Glucose reference range applies only to samples taken after fasting for at least 8 hours.   BUN 72 (H) 8 - 23 mg/dL   Creatinine, Ser 7.46 (H) 0.61 - 1.24 mg/dL   Calcium  8.9 8.9 - 10.3 mg/dL   Phosphorus  5.2 (H) 2.5 - 4.6 mg/dL   Albumin  2.5 (L) 3.5 - 5.0 g/dL   GFR, Estimated 26 (L) >60 mL/min    Comment: (NOTE) Calculated using the CKD-EPI Creatinine Equation (2021)    Anion gap 11 5 - 15    Comment: Performed at [facility], [address], [place], [place] [address]  Magnesium      Status: None   Collection Time: 05/05/24  4:25 AM  Result Value Ref Range   Magnesium  2.0 1.7 - 2.4 mg/dL    Comment: Performed at [facility], [address], [place], [place] [address]  Heparin  level (unfractionated)     Status: None   Collection Time: 05/05/24  4:26 AM  Result Value Ref Range   Heparin  Unfractionated 0.43 0.30 - 0.70 IU/mL    Comment: (NOTE) The clinical reportable range upper limit is being lowered to >1.10 to align with the FDA approved guidance for the current laboratory assay.  If heparin  results are below expected values, and  patient dosage has  been confirmed, suggest follow up testing of antithrombin III levels. Performed at [facility], [address] [place], [place] [address]   Glucose, capillary     Status: Abnormal   Collection Time: 05/05/24  7:15 AM  Result Value Ref Range   Glucose-Capillary 182 (H) 70 - 99 mg/dL    Comment: Glucose reference range applies only to samples taken after fasting for at least 8 hours.    Imaging / Studies: IR NEPHROSTOMY PLACEMENT BILATERAL Result Date: 05/04/2024 INDICATION: 73 year old male with history of cystectomy with ileal conduit presenting with bilateral distal renal obstruction. EXAM: 1. ULTRASOUND GUIDANCE FOR PUNCTURE OF THE left RENAL COLLECTING SYSTEM 2. Left PERCUTANEOUS NEPHROSTOMY TUBE PLACEMENT. 3. ULTRASOUND GUIDANCE FOR PUNCTURE OF THE right RENAL COLLECTING SYSTEM 4. Right PERCUTANEOUS NEPHROSTOMY TUBE PLACEMENT. COMPARISON:  None Available. MEDICATIONS: The patient was currently receiving intravenous antibiotics as an inpatient. No additional antibiotics were administered. ANESTHESIA/SEDATION: Moderate (conscious) sedation was employed during this procedure. A total of Versed  2 mg and Fentanyl  100 mcg was administered intravenously. Moderate Sedation Time: 17 minutes. The patient's level of consciousness and vital signs were monitored continuously by radiology nursing throughout the procedure under my direct supervision. CONTRAST:  Fifteen mL Isovue  300 - administered into the renal collecting system FLUOROSCOPY TIME:  Eleven mGy reference air kerma COMPLICATIONS: None immediate. PROCEDURE: The procedure, risks, benefits, and alternatives were explained to the patient. Questions regarding the procedure were encouraged and answered. The patient understands and consents to the procedure. A timeout was performed prior to the initiation of the procedure. The bilateral flank regions were prepped and draped in the usual sterile fashion and a sterile drape was  applied covering the operative field. A sterile gown and sterile gloves were used for the procedure. Local anesthesia was provided with 1% Lidocaine  with epinephrine . Ultrasound was used to localize the left kidney. Under direct ultrasound guidance, a 20 gauge needle was advanced into the renal collecting system. An ultrasound image documentation was performed. Access within the collecting system was confirmed with the efflux of urine followed by limited contrast injection. Over a Nitrex wire, the tract was dilated with an Accustick stent. Next, under intermittent fluoroscpic guidance and over a short Amplatz wire, the track was dilated ultimately allowing placement of a 10-French percutaneous nephrostomy catheter which was advanced to the level of the renal pelvis where the coil was formed and locked. Contrast was injected and several spot fluoroscopic  images were obtained in various obliquities. The catheter was secured at the skin with a Prolene retention suture and stat lock device and connected to a gravity bag was placed. Ultrasound was used to localize the right kidney. Under direct ultrasound guidance, a 20 gauge needle was advanced into the renal collecting system. An ultrasound image documentation was performed. Access within the collecting system was confirmed with the efflux of urine followed by limited contrast injection. Over a Nitrex wire, the tract was dilated with an Accustick stent. Next, under intermittent fluoroscpic guidance and over a short Amplatz wire, the track was dilated ultimately allowing placement of a 10-French percutaneous nephrostomy catheter which was advanced to the level of the renal pelvis where the coil was formed and locked. Contrast was injected and several spot fluoroscopic images were obtained in various obliquities. The catheter was secured at the skin with a Prolene retention suture and stat lock device and connected to a gravity bag was placed. Dressings were applied. The  patient tolerated procedure well without immediate postprocedural complication. FINDINGS: Ultrasound scanning demonstrates a moderate to severely dilated bilateral collecting systems. Under a combination of ultrasound and fluoroscopic guidance, a posterior inferior calix was targeted allowing placement of bilateral 10-French percutaneous nephrostomy catheters with ends coiled and locked within the renal pelves. Contrast injection confirmed appropriate positioning. IMPRESSION: Successful ultrasound and fluoroscopic guided placement of a bilateral 10 French percutaneous nephrostomy tubes. Ester Sides, MD [facility] Electronically Signed   By: Ester Sides M.D.   On: 05/04/2024 16:17   CT ABDOMEN PELVIS W CONTRAST Result Date: 05/03/2024 CLINICAL DATA:  Bladder dysfunction (Ped 0-17y). 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 EXAM: CT ABDOMEN AND PELVIS WITH CONTRAST TECHNIQUE: Multidetector CT imaging of the abdomen and pelvis was performed using the standard protocol following bolus administration of intravenous contrast. RADIATION DOSE REDUCTION: This exam was performed according to the departmental dose-optimization program which includes automated exposure control, adjustment of the mA and/or kV according to patient size and/or use of iterative reconstruction technique. CONTRAST:  75mL OMNIPAQUE  IOHEXOL  350 MG/ML SOLN COMPARISON:  CT abdomen 04/19/2024 FINDINGS: Lower chest: Coronary artery calcification.  No acute abnormality. Hepatobiliary: No focal liver abnormality. Status post cholecystectomy. No biliary dilatation. Pancreas: Coarse calcifications throughout the parenchyma. No focal lesion. Normal pancreatic contour. No surrounding inflammatory changes. No main pancreatic ductal dilatation. Spleen: Normal in size without focal abnormality. Adrenals/Urinary Tract: No adrenal nodule bilaterally. Bilateral kidneys enhance  symmetrically. Persistent severe left hydroureteronephrosis and moderate right hydroureteronephrosis. Renal parenchymal thinning and scarring on the left. Status post cystectomy. Right lower abdominal wall ileal conduit formation (3:81). Stomach/Bowel: Right abdominal lower anterior abdominal wall and ileostomy formation. PO contrast opacifies the large bowel. Enteric tube with tip and side port within the gastric lumen. Stomach is within normal limits. No evidence of bowel wall thickening or dilatation. Colonic diverticulosis. Appendix appears normal. Vascular/Lymphatic: No abdominal aorta or iliac aneurysm. Moderate atherosclerotic plaque of the aorta and its branches. No abdominal, pelvic, or inguinal lymphadenopathy. Reproductive: Prostatectomy. Other: Bilateral lower abdominal wall surgical drains again noted with tips terminating within the right lower quadrant. Interval development of an organized fluid collection measuring 4.3 x 3.6 cm within the right anterolateral abdominal musculature with finding that appears to be contiguous with the peritoneum (3:46). Associated free fluid along the right lateral anterior abdomen that appears to possibly be originating from the ileum where ileal bowel sutures are noted within the right abdomen (3:68). Question  fistulization and dehiscence of the ileal bowel sutures with the right abdomen of soft tissues (3:69). Associated free fluid along the right lateral anterior abdomen with extension into the soft tissue and fistulization not excluded between the abscess and the dermis (3:60). No free gas. Musculoskeletal: Possible developing supraumbilical tiny ventral hernia containing fluid. No suspicious lytic or blastic osseous lesions. No acute displaced fracture. IMPRESSION: 1. Interval development of an organized fluid collection measuring 4.3 x 3.6 cm within the right anterolateral abdominal musculature with finding that appears to be contiguous with the peritoneum.  Extensive inflammatory changes along the abdomen and abdominal soft tissues on the right. Question fistulization and dehiscence of the ileal bowel sutures with the right abdomen of soft tissues. 2. Ileal conduit appears to course adjacent the inflammatory changes with no urothelial thickening to suggest superimposed renal infection. 3. Possible developing supraumbilical tiny ventral hernia containing fluid. 4. Chronic pancreatitis with no findings of acute pancreatitis. 5. Colonic diverticulosis with no acute diverticulitis. 6. 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 7. Persistent severe left hydroureteronephrosis and moderate right hydroureteronephrosis. 8.  Aortic Atherosclerosis (ICD10-I70.0). Electronically Signed   By: Morgane  Naveau M.D.   On: 05/03/2024 16:42       Medications / Allergies: per chart  Antibiotics: Anti-infectives (From admission, onward)    Start     Dose/Rate Route Frequency Ordered Stop   05/01/24 1400  piperacillin -tazobactam (ZOSYN ) IVPB 2.25 g        2.25 g 100 mL/hr over 30 Minutes Intravenous Every 8 hours 05/01/24 0957     05/01/24 1045  micafungin  (MYCAMINE ) 100 mg in sodium chloride  0.9 % 100 mL IVPB        100 mg 105 mL/hr over 1 Hours Intravenous Every 24 hours 05/01/24 0949     04/29/24 1000  micafungin  (MYCAMINE ) 150 mg in sodium chloride  0.9 % 100 mL IVPB  Status:  Discontinued        150 mg 107.5 mL/hr over 1 Hours Intravenous Every 24 hours 04/28/24 1036 05/01/24 0949   04/28/24 1345  micafungin  (MYCAMINE ) 100 mg in sodium chloride  0.9 % 100 mL IVPB        100 mg 105 mL/hr over 1 Hours Intravenous  Once 04/28/24 1245 04/28/24 1354   04/28/24 1000  micafungin  (MYCAMINE ) 100 mg in sodium chloride  0.9 % 100 mL IVPB  Status:  Discontinued        100 mg 105 mL/hr over 1 Hours Intravenous Every 24 hours 04/27/24 1219 04/28/24 1036   04/21/24 1400  clindamycin  (CLEOCIN ) 900 mg, gentamicin   (GARAMYCIN ) 240 mg in sodium chloride  0.9 % 1,000 mL for intraperitoneal lavage  Status:  Discontinued         Irrigation To Surgery 04/21/24 1346 04/21/24 1618   04/20/24 1400  piperacillin -tazobactam (ZOSYN ) IVPB 3.375 g  Status:  Discontinued        3.375 g 12.5 mL/hr over 240 Minutes Intravenous Every 8 hours 04/20/24 0755 05/01/24 0957   04/20/24 1000  metroNIDAZOLE  (FLAGYL ) IVPB 500 mg  Status:  Discontinued        500 mg 100 mL/hr over 60 Minutes Intravenous Every 12 hours 04/20/24 0320 04/20/24 0752   04/20/24 1000  micafungin  (MYCAMINE ) 150 mg in sodium chloride  0.9 % 100 mL IVPB  Status:  Discontinued        150 mg 107.5 mL/hr over 1 Hours Intravenous Every 24 hours 04/20/24 0752 04/27/24 1219   04/20/24 0245  ceFEPIme  (MAXIPIME ) 2 g in sodium chloride  0.9 % 100 mL IVPB  Status:  Discontinued        2 g 200 mL/hr over 30 Minutes Intravenous Every 8 hours 04/20/24 0232 04/20/24 0752   04/20/24 0100  metroNIDAZOLE  (FLAGYL ) IVPB 500 mg        500 mg 100 mL/hr over 60 Minutes Intravenous On call to O.R. 04/20/24 0004 04/20/24 0100   04/14/24 1800  ceFEPIme  (MAXIPIME ) 2 g in sodium chloride  0.9 % 100 mL IVPB        2 g 200 mL/hr over 30 Minutes Intravenous Every 8 hours 04/14/24 1003 04/19/24 0957   04/14/24 1600  vancomycin  (VANCOCIN ) IVPB 1000 mg/200 mL premix  Status:  Discontinued        1,000 mg 200 mL/hr over 60 Minutes Intravenous Every 24 hours 04/13/24 1420 04/14/24 1000   04/14/24 1600  vancomycin  (VANCOREADY) IVPB 1500 mg/300 mL  Status:  Discontinued        1,500 mg 150 mL/hr over 120 Minutes Intravenous Every 24 hours 04/14/24 1002 04/16/24 0744   04/14/24 1200  vancomycin  (VANCOCIN ) IVPB 1000 mg/200 mL premix  Status:  Discontinued        1,000 mg 200 mL/hr over 60 Minutes Intravenous Every 24 hours 04/13/24 1053 04/13/24 1109   04/13/24 2200  ceFEPIme  (MAXIPIME ) 2 g in sodium chloride  0.9 % 100 mL IVPB  Status:  Discontinued        2 g 200 mL/hr over 30 Minutes  Intravenous Every 12 hours 04/13/24 1036 04/13/24 1109   04/13/24 2200  ceFEPIme  (MAXIPIME ) 2 g in sodium chloride  0.9 % 100 mL IVPB  Status:  Discontinued        2 g 200 mL/hr over 30 Minutes Intravenous Every 12 hours 04/13/24 1425 04/14/24 1003   04/13/24 1500  vancomycin  (VANCOCIN ) 2,000 mg in sodium chloride  0.9 % 500 mL IVPB        2,000 mg 260 mL/hr over 120 Minutes Intravenous  Once 04/13/24 1409 04/13/24 1850   04/13/24 1500  ceFEPIme  (MAXIPIME ) 2 g in sodium chloride  0.9 % 100 mL IVPB  Status:  Discontinued        2 g 200 mL/hr over 30 Minutes Intravenous Every 12 hours 04/13/24 1420 04/13/24 1425   04/13/24 1500  metroNIDAZOLE  (FLAGYL ) IVPB 500 mg  Status:  Discontinued        500 mg 100 mL/hr over 60 Minutes Intravenous Every 12 hours 04/13/24 1420 04/17/24 0725   04/13/24 1200  vancomycin  (VANCOCIN ) 2,000 mg in sodium chloride  0.9 % 500 mL IVPB  Status:  Discontinued        2,000 mg 260 mL/hr over 120 Minutes Intravenous  Once 04/13/24 1052 04/13/24 1121   04/13/24 1100  metroNIDAZOLE  (FLAGYL ) IVPB 500 mg  Status:  Discontinued        500 mg 100 mL/hr over 60 Minutes Intravenous 2 times daily 04/13/24 1007 04/13/24 1109   04/13/24 1100  vancomycin  (VANCOCIN ) IVPB 1000 mg/200 mL premix  Status:  Discontinued        1,000 mg 200 mL/hr over 60 Minutes Intravenous  Once 04/13/24 1007 04/13/24 1020   04/13/24 1015  ceFEPIme  (MAXIPIME ) 2 g in sodium chloride  0.9 % 100 mL IVPB        2 g 200 mL/hr over 30 Minutes Intravenous STAT 04/13/24 1007 04/14/24 1721   04/10/24 2200  ceFAZolin  (ANCEF ) IVPB 2g/100 mL premix        2 g 200  mL/hr over 30 Minutes Intravenous Every 8 hours 04/10/24 1833 04/11/24 0531   04/10/24 0600  ceFAZolin  (ANCEF ) IVPB 2g/100 mL premix        2 g 200 mL/hr over 30 Minutes Intravenous On call to O.R. 04/10/24 0533 04/10/24 1526         Note: Portions of this report may have been transcribed using voice recognition software. Every effort was made to  ensure accuracy; however, inadvertent computerized transcription errors may be present.   Any transcriptional errors that result from this process are unintentional.    Elspeth KYM Schultze, MD, FACS, MASCRS Esophageal, Gastrointestinal & Colorectal Surgery Robotic and Minimally Invasive Surgery  [facility] [address] [place], [place] [address] [phone] Fax [phone] Main  CONTACT INFORMATION: Weekday (9AM-5PM): Call [facility] main office at [phone] Weeknight (5PM-9AM) or Weekend/Holiday: Check EPIC Web Links tab & use AMION (password  [email or web address]) for General Surgery [facility] coverage  Please, DO NOT use SecureChat  (it is not reliable communication to reach operating surgeons & will lead to a delay in care).   Epic staff messaging available for outptient concerns needing 1-2 business day response.      05/05/2024  7:27 AM

## 2024-05-05 NOTE — Progress Notes (Signed)
 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 Authorizing Provider  acetaminophen  (TYLENOL ) 500 MG tablet Take 1,000 mg by mouth every 8 (eight) hours as needed for mild pain or headache.    Yes [provider]  bisacodyl  (DULCOLAX) 5 MG EC tablet Take 2 tablets (10 mg total) by mouth daily as needed for moderate constipation. Patient taking differently: Take 15 mg by mouth daily. 12/09/18  Yes Sebastian Toribio GAILS, MD  Cyanocobalamin  (VITAMIN B-12 PO) Place 1 tablet under the tongue 3 (three) times a week.   Yes [provider]  cyclobenzaprine  (FLEXERIL ) 5 MG tablet Take 1 tablet (5 mg total) by mouth 3 (three) times daily as needed for muscle spasms. 04/10/24  Yes [name], MD  D-MANNOSE PO Take 2-3 tablets by mouth See admin instructions. Take 2 tablets by mouth every morning & take 1 tablet by mouth at bedtime.   Yes [provider]  diphenhydrAMINE  (BENADRYL ) 25 mg capsule Take 25 mg by mouth every other day as needed (congestion/allergies (every other night)).   Yes [provider]  ELIQUIS 5 MG TABS tablet Take 5 mg by mouth 2 (two) times daily.   Yes [provider]  ipratropium (ATROVENT ) 0.03 % nasal spray Place 1 spray into both nostrils 3 (three) times daily as needed (congestion).   Yes [provider]  metFORMIN (GLUCOPHAGE) 500 MG tablet Take 500 mg by mouth in the morning. 01/29/24  Yes [provider]  Multiple Vitamins-Minerals (CENTRUM MINIS ADULTS 50+) TABS Take 1 tablet by mouth See  admin instructions. Take 1 tablet by mouth twice daily on every other day.   Yes [provider]  Omega-3 Fatty Acids (FISH OIL PO) Take 1 capsule by mouth 3 (three) times a week.   Yes [provider]  oxyCODONE -acetaminophen  (PERCOCET) 5-325 MG tablet Take 1 tablet by mouth every 6 (six) hours as needed for severe pain (pain score 7-10). Can increase to 2 pills at a time if needed 04/10/24  Yes Gross, Standing, MD  polyethylene glycol (MIRALAX ) 17 g packet Take 17 g by mouth daily as needed (constipation.).   Yes [provider]  rosuvastatin  (CRESTOR ) 10 MG tablet Take 10 mg by mouth every evening.   Yes [provider]  triamcinolone  cream (KENALOG ) 0.1 % Apply 1 application topically daily as needed. 10/17/21  Yes [name], MD     Vital Signs: BP 104/70   Pulse (!) 106   Temp 97.9 F (36.6 C) (Axillary)   Resp 15   Ht 5' 11 (1.803 m)   Wt 98 lb 14.4 oz (44.9 kg)   SpO2 96%   BMI 13.79 kg/m   Physical Exam Vitals reviewed.   Skin:    General: Skin is warm.     Comments: B PCN sites are clean and dry; NT No bleeding OP pinkish bilaterally 50-100 cc Left Over 300 cc Right     Imaging: IR NEPHROSTOMY PLACEMENT BILATERAL Result Date: 05/04/2024 INDICATION: 73 year old male with history of cystectomy with ileal conduit presenting with bilateral distal renal obstruction. EXAM: 1. ULTRASOUND  GUIDANCE FOR PUNCTURE OF THE left RENAL COLLECTING SYSTEM 2. Left PERCUTANEOUS NEPHROSTOMY TUBE PLACEMENT. 3. ULTRASOUND GUIDANCE FOR PUNCTURE OF THE right RENAL COLLECTING SYSTEM 4. Right PERCUTANEOUS NEPHROSTOMY TUBE PLACEMENT. COMPARISON:  None Available. MEDICATIONS: The patient was currently receiving intravenous antibiotics as an inpatient. No additional antibiotics were administered. ANESTHESIA/SEDATION: Moderate (conscious) sedation was employed during this procedure. A total of Versed  2 mg and Fentanyl  100 mcg was administered intravenously.  Moderate Sedation Time: 17 minutes. The patient's level of consciousness and vital signs were monitored continuously by radiology nursing throughout the procedure under my direct supervision. CONTRAST:  Fifteen mL Isovue  300 - administered into the renal collecting system FLUOROSCOPY TIME:  Eleven mGy reference air kerma COMPLICATIONS: None immediate. PROCEDURE: The procedure, risks, benefits, and alternatives were explained to the patient. Questions regarding the procedure were encouraged and answered. The patient understands and consents to the procedure. A timeout was performed prior to the initiation of the procedure. The bilateral flank regions were prepped and draped in the usual sterile fashion and a sterile drape was applied covering the operative field. A sterile gown and sterile gloves were used for the procedure. Local anesthesia was provided with 1% Lidocaine  with epinephrine . Ultrasound was used to localize the left kidney. Under direct ultrasound guidance, a 20 gauge needle was advanced into the renal collecting system. An ultrasound image documentation was performed. Access within the collecting system was confirmed with the efflux of urine followed by limited contrast injection. Over a Nitrex wire, the tract was dilated with an Accustick stent. Next, under intermittent fluoroscpic guidance and over a short Amplatz wire, the track was dilated ultimately allowing placement of a 10-French percutaneous nephrostomy catheter which was advanced to the level of the renal pelvis where the coil was formed and locked. Contrast was injected and several spot fluoroscopic images were obtained in various obliquities. The catheter was secured at the skin with a Prolene retention suture and stat lock device and connected to a gravity bag was placed. Ultrasound was used to localize the right kidney. Under direct ultrasound guidance, a 20 gauge needle was advanced into the renal collecting system. An ultrasound image  documentation was performed. Access within the collecting system was confirmed with the efflux of urine followed by limited contrast injection. Over a Nitrex wire, the tract was dilated with an Accustick stent. Next, under intermittent fluoroscpic guidance and over a short Amplatz wire, the track was dilated ultimately allowing placement of a 10-French percutaneous nephrostomy catheter which was advanced to the level of the renal pelvis where the coil was formed and locked. Contrast was injected and several spot fluoroscopic images were obtained in various obliquities. The catheter was secured at the skin with a Prolene retention suture and stat lock device and connected to a gravity bag was placed. Dressings were applied. The patient tolerated procedure well without immediate postprocedural complication. FINDINGS: Ultrasound scanning demonstrates a moderate to severely dilated bilateral collecting systems. Under a combination of ultrasound and fluoroscopic guidance, a posterior inferior calix was targeted allowing placement of bilateral 10-French percutaneous nephrostomy catheters with ends coiled and locked within the renal pelves. Contrast injection confirmed appropriate positioning. IMPRESSION: Successful ultrasound and fluoroscopic guided placement of a bilateral 10 French percutaneous nephrostomy tubes. Ester Sides, MD [facility] Radiology Electronically Signed   By: Ester Sides M.D.   On: 05/04/2024 16:17   CT ABDOMEN PELVIS W CONTRAST Result Date: 05/03/2024 CLINICAL DATA:  Bladder dysfunction (Ped 0-17y). History of a cystectomy  and right lower quadrant ileal conduit. EXAM: CT ABDOMEN AND PELVIS WITH CONTRAST TECHNIQUE: Multidetector CT imaging of the abdomen and pelvis was performed using the standard protocol following bolus administration of intravenous contrast. RADIATION DOSE REDUCTION: This exam was performed according to the departmental  dose-optimization program which includes automated exposure control, adjustment of the mA and/or kV according to patient size and/or use of iterative reconstruction technique. CONTRAST:  75mL OMNIPAQUE  IOHEXOL  350 MG/ML SOLN COMPARISON:  CT abdomen 04/19/2024 FINDINGS: Lower chest: Coronary artery calcification.  No acute abnormality. Hepatobiliary: No focal liver abnormality. Status post cholecystectomy. No biliary dilatation. Pancreas: Coarse calcifications throughout the parenchyma. No focal lesion. Normal pancreatic contour. No surrounding inflammatory changes. No main pancreatic ductal dilatation. Spleen: Normal in size without focal abnormality. Adrenals/Urinary Tract: No adrenal nodule bilaterally. Bilateral kidneys enhance symmetrically. Persistent severe left hydroureteronephrosis and moderate right hydroureteronephrosis. Renal parenchymal thinning and scarring on the left. Status post cystectomy. Right lower abdominal wall ileal conduit formation (3:81). Stomach/Bowel: Right abdominal lower anterior abdominal wall and ileostomy formation. PO contrast opacifies the large bowel. Enteric tube with tip and side port within the gastric lumen. Stomach is within normal limits. No evidence of bowel wall thickening or dilatation. Colonic diverticulosis. Appendix appears normal. Vascular/Lymphatic: No abdominal aorta or iliac aneurysm. Moderate atherosclerotic plaque of the aorta and its branches. No abdominal, pelvic, or inguinal lymphadenopathy. Reproductive: Prostatectomy. Other: Bilateral lower abdominal wall surgical drains again noted with tips terminating within the right lower quadrant. Interval development of an organized fluid collection measuring 4.3 x 3.6 cm within the right anterolateral abdominal musculature with finding that appears to be contiguous with the peritoneum (3:46). Associated free fluid along the right lateral anterior abdomen that appears to possibly be originating from the ileum where  ileal bowel sutures are noted within the right abdomen (3:68). Question fistulization and dehiscence of the ileal bowel sutures with the right abdomen of soft tissues (3:69). Associated free fluid along the right lateral anterior abdomen with extension into the soft tissue and fistulization not excluded between the abscess and the dermis (3:60). No free gas. Musculoskeletal: Possible developing supraumbilical tiny ventral hernia containing fluid. No suspicious lytic or blastic osseous lesions. No acute displaced fracture. IMPRESSION: 1. Interval development of an organized fluid collection measuring 4.3 x 3.6 cm within the right anterolateral abdominal musculature with finding that appears to be contiguous with the peritoneum. Extensive inflammatory changes along the abdomen and abdominal soft tissues on the right. Question fistulization and dehiscence of the ileal bowel sutures with the right abdomen of soft tissues. 2. Ileal conduit appears to course adjacent the inflammatory changes with no urothelial thickening to suggest superimposed renal infection. 3. Possible developing supraumbilical tiny ventral hernia containing fluid. 4. Chronic pancreatitis with no findings of acute pancreatitis. 5. Colonic diverticulosis with no acute diverticulitis. 6. 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 7. Persistent severe left hydroureteronephrosis and moderate right hydroureteronephrosis. 8.  Aortic Atherosclerosis (ICD10-I70.0). Electronically Signed   By: Morgane  Naveau M.D.   On: 05/03/2024 16:42    Labs:  CBC: Recent Labs    04/30/24 0330 05/01/24 0417 05/01/24 1313 05/04/24 0341  WBC 14.5* 13.6* 16.4* 12.4*  HGB 8.4* 7.9* 8.2* 9.1*  HCT 26.5* 25.1* 26.3* 28.0*  PLT 217 135* 111* 136*    COAGS: Recent Labs    04/13/24 1047 04/14/24 1141 04/23/24 0416 04/24/24 0338 04/24/24 0601 05/04/24 1020  INR 1.6* 1.6*  --   --   --  1.1   APTT 47*  --  37* 189* 167*  --     BMP: Recent Labs    05/02/24 0515 05/03/24 0453 05/04/24 0341 05/05/24 0425  NA 135 132* 133* 134*  K 5.3* 5.4* 5.0 4.1  CL 102 101 99 97*  CO2 23 20* 21* 26  GLUCOSE 123* 170* 166* 184*  BUN 69* 96* 109* 72*  CALCIUM  8.6* 8.4* 9.2 8.9  CREATININE 2.96* 3.60* 3.86* 2.53*  GFRNONAA 22* 17* 16* 26*    LIVER FUNCTION TESTS: Recent Labs    04/21/24 1715 04/22/24 1604 04/23/24 0416 04/23/24 1600 04/26/24 0644 04/27/24 0453 04/28/24 0441 05/02/24 0515 05/03/24 0453 05/04/24 0341 05/05/24 0425  BILITOT 0.7  --  0.7  --  0.9 0.6  --   --   --   --   --   AST 19  --  21  --  25 20  --   --   --   --   --   ALT 12  --  13  --  33 29  --   --   --   --   --   ALKPHOS 48  --  46  --  58 58  --   --   --   --   --   PROT 4.7*  --  4.9*  --  5.6* 5.5*  --   --   --   --   --   ALBUMIN  2.1*   < > 1.8*  1.7*   < > 2.0* 1.8*  1.8*   < > 2.2* 2.3* 2.4* 2.5*   < > = values in this interval not displayed.   Drain Location: CVA bilateral Size: Fr size: 10 Fr Date of placement: 05/04/24  Currently to: Drain collection device: gravity 24 hour output:  Output by Drain (mL) 05/03/24 0701 - 05/03/24 1900 05/03/24 1901 - 05/04/24 0700 05/04/24 0701 - 05/04/24 1900 05/04/24 1901 - 05/05/24 0700 05/05/24 0701 - 05/05/24 0821  Closed System Drain 1 Right;Lateral Abdomen Bulb (JP) 19 Fr. 1350 1325 340    Closed System Drain 1 Left;Lateral Abdomen Bulb (JP) 19 Fr. 850 565 360    Closed System Drain Left Back 10 Fr.   100 75   Closed System Drain Right Back 10 Fr.   350 500   Negative Pressure Wound Therapy Abdomen Medial;Upper 25  0      Interval imaging/drain manipulation:  none  Current examination: Flushes/aspirates easily.  Insertion site unremarkable. Suture and stat lock in place. Dressed appropriately.  OP pinkish Bilat Clean and dry NT no bleeding No hematoma  Plan: Continue TID flushes with 5 cc NS. Record output Q  shift. Dressing changes QD or PRN if soiled.  Call IR APP or on call IR MD if difficulty flushing or sudden change in drain output.  Repeat imaging/possible drain injection once output < 10 mL/QD (excluding flush material). Consideration for drain removal if output is < 10 mL/QD (excluding flush material), pending discussion with the providing surgical service.  Discharge planning:  Plans per Urology.  Assessment and Plan:  Call if need anything from OR Plans per Urolgy  Electronically Signed: Sharlet DELENA Candle, PA-C 05/05/2024, 8:17 AM   I spent a total of 15 Minutes at the the patient's bedside AND on the patient's hospital floor or unit, greater than 50% of which was counseling/coordinating care for Bilat PCNs

## 2024-05-05 NOTE — Progress Notes (Signed)
 Patient ID: Kristopher Thompson, male   DOB: 09-22-1951, 73 y.o.   MRN: [id number] S: No new complaints O:BP 112/69   Pulse (!) 103   Temp 97.9 F (36.6 C) (Axillary)   Resp (!) 22   Ht 5' 11 (1.803 m)   Wt 44.9 kg   SpO2 95%   BMI 13.79 kg/m   Intake/Output Summary (Last 24 hours) at 05/05/2024 1032 Last data filed at 05/05/2024 0600 Gross per 24 hour  Intake 1654.79 ml  Output 2315 ml  Net -660.21 ml   Intake/Output: I/O last 3 completed shifts: In: 3201.2 [I.V.:407.9; NG/GT:2438.3; IV Piggyback:355] Out: 4885 [Urine:550; Drains:3615; Stool:720]  Intake/Output this shift:  No intake/output data recorded. Weight change: -55.3 kg Gen: NAD CVS: tachy at !03 Resp:CTA Abd: +BS, soft, NT/ND, ostomy in place in RLQ, wound vac in place Ext: no edema  Recent Labs  Lab 04/30/24 0330 04/30/24 1611 05/01/24 0417 05/02/24 0515 05/03/24 0453 05/04/24 0341 05/05/24 0425  NA 135 135 135 135 132* 133* 134*  K 4.7 4.9 4.9 5.3* 5.4* 5.0 4.1  CL 104 105 105 102 101 99 97*  CO2 23 23 25 23  20* 21* 26  GLUCOSE 171* 145* 159* 123* 170* 166* 184*  BUN 50* 49* 38* 69* 96* 109* 72*  CREATININE 1.67* 1.58* 1.58* 2.96* 3.60* 3.86* 2.53*  ALBUMIN  2.0* 2.1* 2.0* 2.2* 2.3* 2.4* 2.5*  CALCIUM  7.8* 7.9* 7.9* 8.6* 8.4* 9.2 8.9  PHOS 2.7 2.4* 2.2* 5.1* 6.5* 7.2* 5.2*   Liver Function Tests: Recent Labs  Lab 05/03/24 0453 05/04/24 0341 05/05/24 0425  ALBUMIN  2.3* 2.4* 2.5*   No results for input(s): LIPASE, AMYLASE in the last 168 hours. No results for input(s): AMMONIA in the last 168 hours. CBC: Recent Labs  Lab 04/29/24 0355 04/30/24 0330 05/01/24 0417 05/01/24 1313 05/04/24 0341  WBC 12.9* 14.5* 13.6* 16.4* 12.4*  HGB 8.4* 8.4* 7.9* 8.2* 9.1*  HCT 26.5* 26.5* 25.1* 26.3* 28.0*  MCV 89.2 91.4 90.6 92.0 90.9  PLT 305 217 135* 111* 136*   Cardiac Enzymes: No results for input(s): CKTOTAL, CKMB, CKMBINDEX, TROPONINI in the last 168 hours. CBG: Recent Labs  Lab  05/04/24 1549 05/04/24 1927 05/04/24 2318 05/05/24 0316 05/05/24 0715  GLUCAP 151* 151* 127* 179* 182*    Iron  Studies: No results for input(s): IRON , TIBC, TRANSFERRIN, FERRITIN in the last 72 hours. Studies/Results: IR NEPHROSTOMY PLACEMENT BILATERAL Result Date: 05/04/2024 INDICATION: 73 year old male with history of cystectomy with ileal conduit presenting with bilateral distal renal obstruction. EXAM: 1. ULTRASOUND GUIDANCE FOR PUNCTURE OF THE left RENAL COLLECTING SYSTEM 2. Left PERCUTANEOUS NEPHROSTOMY TUBE PLACEMENT. 3. ULTRASOUND GUIDANCE FOR PUNCTURE OF THE right RENAL COLLECTING SYSTEM 4. Right PERCUTANEOUS NEPHROSTOMY TUBE PLACEMENT. COMPARISON:  None Available. MEDICATIONS: The patient was currently receiving intravenous antibiotics as an inpatient. No additional antibiotics were administered. ANESTHESIA/SEDATION: Moderate (conscious) sedation was employed during this procedure. A total of Versed  2 mg and Fentanyl  100 mcg was administered intravenously. Moderate Sedation Time: 17 minutes. The patient's level of consciousness and vital signs were monitored continuously by radiology nursing throughout the procedure under my direct supervision. CONTRAST:  Fifteen mL Isovue  300 - administered into the renal collecting system FLUOROSCOPY TIME:  Eleven mGy reference air kerma COMPLICATIONS: None immediate. PROCEDURE: The procedure, risks, benefits, and alternatives were explained to the patient. Questions regarding the procedure were encouraged and answered. The patient understands and consents to the procedure. A timeout was performed prior to the initiation of the procedure. The bilateral  flank regions were prepped and draped in the usual sterile fashion and a sterile drape was applied covering the operative field. A sterile gown and sterile gloves were used for the procedure. Local anesthesia was provided with 1% Lidocaine  with epinephrine . Ultrasound was used to localize the left  kidney. Under direct ultrasound guidance, a 20 gauge needle was advanced into the renal collecting system. An ultrasound image documentation was performed. Access within the collecting system was confirmed with the efflux of urine followed by limited contrast injection. Over a Nitrex wire, the tract was dilated with an Accustick stent. Next, under intermittent fluoroscpic guidance and over a short Amplatz wire, the track was dilated ultimately allowing placement of a 10-French percutaneous nephrostomy catheter which was advanced to the level of the renal pelvis where the coil was formed and locked. Contrast was injected and several spot fluoroscopic images were obtained in various obliquities. The catheter was secured at the skin with a Prolene retention suture and stat lock device and connected to a gravity bag was placed. Ultrasound was used to localize the right kidney. Under direct ultrasound guidance, a 20 gauge needle was advanced into the renal collecting system. An ultrasound image documentation was performed. Access within the collecting system was confirmed with the efflux of urine followed by limited contrast injection. Over a Nitrex wire, the tract was dilated with an Accustick stent. Next, under intermittent fluoroscpic guidance and over a short Amplatz wire, the track was dilated ultimately allowing placement of a 10-French percutaneous nephrostomy catheter which was advanced to the level of the renal pelvis where the coil was formed and locked. Contrast was injected and several spot fluoroscopic images were obtained in various obliquities. The catheter was secured at the skin with a Prolene retention suture and stat lock device and connected to a gravity bag was placed. Dressings were applied. The patient tolerated procedure well without immediate postprocedural complication. FINDINGS: Ultrasound scanning demonstrates a moderate to severely dilated bilateral collecting systems. Under a combination of  ultrasound and fluoroscopic guidance, a posterior inferior calix was targeted allowing placement of bilateral 10-French percutaneous nephrostomy catheters with ends coiled and locked within the renal pelves. Contrast injection confirmed appropriate positioning. IMPRESSION: Successful ultrasound and fluoroscopic guided placement of a bilateral 10 French percutaneous nephrostomy tubes. Ester Sides, MD [facility]iology Electronically Signed   By: Ester Sides M.D.   On: 05/04/2024 16:17   CT ABDOMEN PELVIS W CONTRAST Result Date: 05/03/2024 CLINICAL DATA:  Bladder dysfunction (Ped 0-17y). 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 EXAM: CT ABDOMEN AND PELVIS WITH CONTRAST TECHNIQUE: Multidetector CT imaging of the abdomen and pelvis was performed using the standard protocol following bolus administration of intravenous contrast. RADIATION DOSE REDUCTION: This exam was performed according to the departmental dose-optimization program which includes automated exposure control, adjustment of the mA and/or kV according to patient size and/or use of iterative reconstruction technique. CONTRAST:  75mL OMNIPAQUE  IOHEXOL  350 MG/ML SOLN COMPARISON:  CT abdomen 04/19/2024 FINDINGS: Lower chest: Coronary artery calcification.  No acute abnormality. Hepatobiliary: No focal liver abnormality. Status post cholecystectomy. No biliary dilatation. Pancreas: Coarse calcifications throughout the parenchyma. No focal lesion. Normal pancreatic contour. No surrounding inflammatory changes. No main pancreatic ductal dilatation. Spleen: Normal in size without focal abnormality. Adrenals/Urinary Tract: No adrenal nodule bilaterally. Bilateral kidneys enhance symmetrically. Persistent severe left hydroureteronephrosis and moderate right hydroureteronephrosis. Renal parenchymal thinning and scarring on the left. Status post cystectomy. Right lower abdominal wall  ileal  conduit formation (3:81). Stomach/Bowel: Right abdominal lower anterior abdominal wall and ileostomy formation. PO contrast opacifies the large bowel. Enteric tube with tip and side port within the gastric lumen. Stomach is within normal limits. No evidence of bowel wall thickening or dilatation. Colonic diverticulosis. Appendix appears normal. Vascular/Lymphatic: No abdominal aorta or iliac aneurysm. Moderate atherosclerotic plaque of the aorta and its branches. No abdominal, pelvic, or inguinal lymphadenopathy. Reproductive: Prostatectomy. Other: Bilateral lower abdominal wall surgical drains again noted with tips terminating within the right lower quadrant. Interval development of an organized fluid collection measuring 4.3 x 3.6 cm within the right anterolateral abdominal musculature with finding that appears to be contiguous with the peritoneum (3:46). Associated free fluid along the right lateral anterior abdomen that appears to possibly be originating from the ileum where ileal bowel sutures are noted within the right abdomen (3:68). Question fistulization and dehiscence of the ileal bowel sutures with the right abdomen of soft tissues (3:69). Associated free fluid along the right lateral anterior abdomen with extension into the soft tissue and fistulization not excluded between the abscess and the dermis (3:60). No free gas. Musculoskeletal: Possible developing supraumbilical tiny ventral hernia containing fluid. No suspicious lytic or blastic osseous lesions. No acute displaced fracture. IMPRESSION: 1. Interval development of an organized fluid collection measuring 4.3 x 3.6 cm within the right anterolateral abdominal musculature with finding that appears to be contiguous with the peritoneum. Extensive inflammatory changes along the abdomen and abdominal soft tissues on the right. Question fistulization and dehiscence of the ileal bowel sutures with the right abdomen of soft tissues. 2. Ileal conduit  appears to course adjacent the inflammatory changes with no urothelial thickening to suggest superimposed renal infection. 3. Possible developing supraumbilical tiny ventral hernia containing fluid. 4. Chronic pancreatitis with no findings of acute pancreatitis. 5. Colonic diverticulosis with no acute diverticulitis. 6. 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 7. Persistent severe left hydroureteronephrosis and moderate right hydroureteronephrosis. 8.  Aortic Atherosclerosis (ICD10-I70.0). Electronically Signed   By: Morgane  Naveau M.D.   On: 05/03/2024 16:42    Chlorhexidine  Gluconate Cloth  6 each Topical Daily   Chlorhexidine  Gluconate Cloth  6 each Topical Q0600   darbepoetin (ARANESP ) injection - DIALYSIS  200 mcg Subcutaneous Q Fri-1800   feeding supplement (PROSource TF20)  60 mL Per Tube BID   feeding supplement (VITAL 1.5 CAL)  1,440 mL Per Tube Q24H   insulin  aspart  0-20 Units Subcutaneous Q4H   insulin  aspart  6 Units Subcutaneous 4 times per day   insulin  glargine-yfgn  25 Units Subcutaneous QHS   loperamide  HCl  2 mg Per Tube QHS   nutrition supplement (JUVEN)  1 packet Per Tube BID BM   mouth rinse  15 mL Mouth Rinse 4 times per day   polycarbophil  625 mg Per Tube BID   prochlorperazine   10 mg Intravenous Once   sevelamer  carbonate  800 mg Per Tube Q8H   sodium chloride  flush  5 mL Intracatheter Q8H    BMET    Component Value Date/Time   NA 134 (L) 05/05/2024 0425   K 4.1 05/05/2024 0425   CL 97 (L) 05/05/2024 0425   CO2 26 05/05/2024 0425   GLUCOSE 184 (H) 05/05/2024 0425   BUN 72 (H) 05/05/2024 0425   CREATININE 2.53 (H) 05/05/2024 0425   CALCIUM  8.9 05/05/2024 0425   GFRNONAA 26 (L) 05/05/2024 0425   GFRAA >60 12/09/2018 0549   CBC  Component Value Date/Time   WBC 12.4 (H) 05/04/2024 0341   RBC 3.08 (L) 05/04/2024 0341   HGB 9.1 (L) 05/04/2024 0341   HCT 28.0 (L) 05/04/2024 0341   PLT 136  (L) 05/04/2024 0341   MCV 90.9 05/04/2024 0341   MCH 29.5 05/04/2024 0341   MCHC 32.5 05/04/2024 0341   RDW 17.3 (H) 05/04/2024 0341   LYMPHSABS 0.8 04/21/2024 1715   MONOABS 0.9 04/21/2024 1715   EOSABS 0.0 04/21/2024 1715   BASOSABS 0.1 04/21/2024 1715    Assessment/ Plan: Pt is a 73 y.o. yo male  with  hypertension, DM, CAD, hx bladder cancer, CHF, right bundle branch block who had acute bowel perforation and status post OR for bowel repair on 6/8, lysis of adhesion, ileal resection end ileostomy hernia repairs, septic shock seen for AKI and oliguria.    # Acute kidney injury, oliguric with anasarca likely ischemic ATN due to septic shock and obstructive uropathy given bilateral hydronephrosis.  CRRT 6/11-6/14 -  Because of the patient's persistent altered mental status and high metabolic state with elevated BUN was put back on CRRT on 6/17- 6/20 -  then taken off- with rising BUN and crt .  Had HD yesterday without UF or complications.  His temp cath is approaching time for replacement/removal -  has been in 10 days and will need new HD catheter and possible tunneled if not improvement of renal function.  S/p bilateral PCNT placement by IR yesterday.  Hopefully will see BUN/Cr continue to improve without HD.   # Bilateral hydronephrosis severe on the left side: S/p revision of ileal conduit 5/30,  urology following. Now no UOP brings to mind possible disruption-  checking surgical drain output for crt -  seemed to be indicative of leak-  have talked to GU-  s/p bilateral percutaneous nephrostomy tube placement by IR on 05/04/24.   # Acute bowel perforation is status post bowel repair ileal conduit and hernia repairs urostomy ileal conduit revision.  As per surgical team.    # HTN: Continue current medications and adjust volume status with dialysis as tolerated   # Acute hypoxic respiratory failure/VDRF per pulmonary team. Extubated 6/14, currently on nasal cannula   # Altered mental  status: Likely multifactorial.  Uremia may have played some role.  Improving slowly -  a little more somnolent today - uremia may have something to do with it    # Anemia-  added ESA -  iron  stores OK   # Hyperkalemia-  give lokelma - and follow after HD and PCN placement.  Fairy RONAL Sellar, MD [facility]

## 2024-05-05 NOTE — Progress Notes (Signed)
 Patient ID: Kristopher Thompson, male   DOB: [birth year], 73 y.o.   MRN: [id number]  14 Days Post-Op Subjective: Delayed urine leak noted over the weekend.  CT shows fluid collection around ileal conduit without periureteral fluid collections.  Bilateral nephrostomy tubes placed yesterday. Received HD yesterday.  Objective: Vital signs in last 24 hours: Temp:  [97.5 F (36.4 C)-99.1 F (37.3 C)] 97.9 F (36.6 C) (06/24 0717) Pulse Rate:  [84-121] 106 (06/24 0600) Resp:  [14-26] 15 (06/24 0600) BP: (89-134)/(61-84) 104/70 (06/24 0600) SpO2:  [91 %-100 %] 96 % (06/24 0600) Weight:  [44.9 kg] 44.9 kg (06/23 2200)  Intake/Output from previous day: 06/23 0701 - 06/24 0700 In: 1791.6 [I.V.:198.3; NG/GT:1338.3; IV Piggyback:255] Out: 2375 [Urine:550; Drains:1375; Stool:450] Intake/Output this shift: No intake/output data recorded.  Physical Exam:  General: Alert and oriented CV: Tachycardic but regular Abd: Stool in ileostomy, Urostomy pink and viable appearing with minimal urine, Bilateral PCNs draining clear urine (R>L as expected with history of left renal atrophy)  Lab Results: Recent Labs    05/04/24 0341  HGB 9.1*  HCT 28.0*      Latest Ref Rng & Units 05/04/2024    3:41 AM 05/01/2024    1:13 PM 05/01/2024    4:17 AM  CBC  WBC 4.0 - 10.5 K/uL 12.4  16.4  13.6   Hemoglobin 13.0 - 17.0 g/dL 9.1  8.2  7.9   Hematocrit 39.0 - 52.0 % 28.0  26.3  25.1   Platelets 150 - 400 K/uL 136  111  135      BMET Recent Labs    05/04/24 0341 05/05/24 0425  NA 133* 134*  K 5.0 4.1  CL 99 97*  CO2 21* 26  GLUCOSE 166* 184*  BUN 109* 72*  CREATININE 3.86* 2.53*  CALCIUM  9.2 8.9     Studies/Results: IR NEPHROSTOMY PLACEMENT BILATERAL Result Date: 05/04/2024 INDICATION: 73 year old male with history of cystectomy with ileal conduit presenting with bilateral distal renal obstruction. EXAM: 1. ULTRASOUND GUIDANCE FOR PUNCTURE OF THE left RENAL COLLECTING SYSTEM 2. Left PERCUTANEOUS  NEPHROSTOMY TUBE PLACEMENT. 3. ULTRASOUND GUIDANCE FOR PUNCTURE OF THE right RENAL COLLECTING SYSTEM 4. Right PERCUTANEOUS NEPHROSTOMY TUBE PLACEMENT. COMPARISON:  None Available. MEDICATIONS: The patient was currently receiving intravenous antibiotics as an inpatient. No additional antibiotics were administered. ANESTHESIA/SEDATION: Moderate (conscious) sedation was employed during this procedure. A total of Versed  2 mg and Fentanyl  100 mcg was administered intravenously. Moderate Sedation Time: 17 minutes. The patient's level of consciousness and vital signs were monitored continuously by radiology nursing throughout the procedure under my direct supervision. CONTRAST:  Fifteen mL Isovue  300 - administered into the renal collecting system FLUOROSCOPY TIME:  Eleven mGy reference air kerma COMPLICATIONS: None immediate. PROCEDURE: The procedure, risks, benefits, and alternatives were explained to the patient. Questions regarding the procedure were encouraged and answered. The patient understands and consents to the procedure. A timeout was performed prior to the initiation of the procedure. The bilateral flank regions were prepped and draped in the usual sterile fashion and a sterile drape was applied covering the operative field. A sterile gown and sterile gloves were used for the procedure. Local anesthesia was provided with 1% Lidocaine  with epinephrine . Ultrasound was used to localize the left kidney. Under direct ultrasound guidance, a 20 gauge needle was advanced into the renal collecting system. An ultrasound image documentation was performed. Access within the collecting system was confirmed with the efflux of urine followed by limited contrast injection. Over a Nitrex  wire, the tract was dilated with an Accustick stent. Next, under intermittent fluoroscpic guidance and over a short Amplatz wire, the track was dilated ultimately allowing placement of a 10-French percutaneous nephrostomy catheter which was  advanced to the level of the renal pelvis where the coil was formed and locked. Contrast was injected and several spot fluoroscopic images were obtained in various obliquities. The catheter was secured at the skin with a Prolene retention suture and stat lock device and connected to a gravity bag was placed. Ultrasound was used to localize the right kidney. Under direct ultrasound guidance, a 20 gauge needle was advanced into the renal collecting system. An ultrasound image documentation was performed. Access within the collecting system was confirmed with the efflux of urine followed by limited contrast injection. Over a Nitrex wire, the tract was dilated with an Accustick stent. Next, under intermittent fluoroscpic guidance and over a short Amplatz wire, the track was dilated ultimately allowing placement of a 10-French percutaneous nephrostomy catheter which was advanced to the level of the renal pelvis where the coil was formed and locked. Contrast was injected and several spot fluoroscopic images were obtained in various obliquities. The catheter was secured at the skin with a Prolene retention suture and stat lock device and connected to a gravity bag was placed. Dressings were applied. The patient tolerated procedure well without immediate postprocedural complication. FINDINGS: Ultrasound scanning demonstrates a moderate to severely dilated bilateral collecting systems. Under a combination of ultrasound and fluoroscopic guidance, a posterior inferior calix was targeted allowing placement of bilateral 10-French percutaneous nephrostomy catheters with ends coiled and locked within the renal pelves. Contrast injection confirmed appropriate positioning. IMPRESSION: Successful ultrasound and fluoroscopic guided placement of a bilateral 10 French percutaneous nephrostomy tubes. Ester Sides, MD [facility] Electronically Signed   By: Ester Sides M.D.    On: 05/04/2024 16:17   CT ABDOMEN PELVIS W CONTRAST Result Date: 05/03/2024 CLINICAL DATA:  Bladder dysfunction (Ped 0-17y). 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 EXAM: CT ABDOMEN AND PELVIS WITH CONTRAST TECHNIQUE: Multidetector CT imaging of the abdomen and pelvis was performed using the standard protocol following bolus administration of intravenous contrast. RADIATION DOSE REDUCTION: This exam was performed according to the departmental dose-optimization program which includes automated exposure control, adjustment of the mA and/or kV according to patient size and/or use of iterative reconstruction technique. CONTRAST:  75mL OMNIPAQUE  IOHEXOL  350 MG/ML SOLN COMPARISON:  CT abdomen 04/19/2024 FINDINGS: Lower chest: Coronary artery calcification.  No acute abnormality. Hepatobiliary: No focal liver abnormality. Status post cholecystectomy. No biliary dilatation. Pancreas: Coarse calcifications throughout the parenchyma. No focal lesion. Normal pancreatic contour. No surrounding inflammatory changes. No main pancreatic ductal dilatation. Spleen: Normal in size without focal abnormality. Adrenals/Urinary Tract: No adrenal nodule bilaterally. Bilateral kidneys enhance symmetrically. Persistent severe left hydroureteronephrosis and moderate right hydroureteronephrosis. Renal parenchymal thinning and scarring on the left. Status post cystectomy. Right lower abdominal wall ileal conduit formation (3:81). Stomach/Bowel: Right abdominal lower anterior abdominal wall and ileostomy formation. PO contrast opacifies the large bowel. Enteric tube with tip and side port within the gastric lumen. Stomach is within normal limits. No evidence of bowel wall thickening or dilatation. Colonic diverticulosis. Appendix appears normal. Vascular/Lymphatic: No abdominal aorta or iliac aneurysm. Moderate atherosclerotic plaque of the aorta and its branches. No abdominal, pelvic, or inguinal lymphadenopathy.  Reproductive: Prostatectomy. Other: Bilateral lower abdominal wall surgical drains again noted with tips terminating within the right lower quadrant. Interval  development of an organized fluid collection measuring 4.3 x 3.6 cm within the right anterolateral abdominal musculature with finding that appears to be contiguous with the peritoneum (3:46). Associated free fluid along the right lateral anterior abdomen that appears to possibly be originating from the ileum where ileal bowel sutures are noted within the right abdomen (3:68). Question fistulization and dehiscence of the ileal bowel sutures with the right abdomen of soft tissues (3:69). Associated free fluid along the right lateral anterior abdomen with extension into the soft tissue and fistulization not excluded between the abscess and the dermis (3:60). No free gas. Musculoskeletal: Possible developing supraumbilical tiny ventral hernia containing fluid. No suspicious lytic or blastic osseous lesions. No acute displaced fracture. IMPRESSION: 1. Interval development of an organized fluid collection measuring 4.3 x 3.6 cm within the right anterolateral abdominal musculature with finding that appears to be contiguous with the peritoneum. Extensive inflammatory changes along the abdomen and abdominal soft tissues on the right. Question fistulization and dehiscence of the ileal bowel sutures with the right abdomen of soft tissues. 2. Ileal conduit appears to course adjacent the inflammatory changes with no urothelial thickening to suggest superimposed renal infection. 3. Possible developing supraumbilical tiny ventral hernia containing fluid. 4. Chronic pancreatitis with no findings of acute pancreatitis. 5. Colonic diverticulosis with no acute diverticulitis. 6. 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 7. Persistent severe left hydroureteronephrosis and moderate right hydroureteronephrosis. 8.   Aortic Atherosclerosis (ICD10-I70.0). Electronically Signed   By: Morgane  Naveau M.D.   On: 05/03/2024 16:42    Assessment/Plan: 1) Urine leak with h/o prior radical cystectomy and ileal conduit urinary diversion: He appears to be clinically stabilizing.  Renal dysfunction is likely multifactorial due to being critically ill and absorption due to delayed urine leak.  CT is non-specific but would suspect any leak is likely from the conduit rather that the ureter.  Once stabilized will consider further evaluation with nephrostograms +/- loopogram to further evaluate location of leak and for possible resolution.  If leak is from the conduit, there would be a question of viability of that bowel segment although stoma appears viable at this time and would favor ongoing observation as long as he is clinically stabilizing.    LOS: 25 days   Noretta Ferrara 05/05/2024, 7:34 AM

## 2024-05-05 NOTE — Progress Notes (Addendum)
 Physical Therapy Treatment Patient Details Name: Kristopher Thompson MRN: [id number] DOB: [birth year] Today's Date: 05/05/2024   History of Present Illness 73 y/o M admitted to [facility] 5/30 for hernia repair and urostomy revision. 6/9 to OR for perforated bowel, post op shock, s/p exploratory lapartomy with drainage of Rt abdominal wall abscess. CRRT 6/11-6/14. Extubated 6/14, Transferred to [facility] 6/15 for intermittent HD. CRRT again 6/17-6/20. 6/23 bil perc nephrostomy tubes placed. PMHx: bladder and prostate CA, RBBB, CHF, colostomy, DM, GERD, abdominal surgery.    PT Comments  Pt with flat affect but able to progress to standing trials which haven't been performed since 6/6. Pt with dizziness in sitting and SBP with repeated trials. Pt and wife educated for HEP, mobility progression and plan. Encouraged chair positioning throughout the day.   Initial sitting 123/68 (80 HR 122 After 2nd stand 99/73 (82) HR 120  96% SPO2 on RA    If plan is discharge home, recommend the following: Assistance with cooking/housework;Assist for transportation;Help with stairs or ramp for entrance;Two people to help with walking and/or transfers;Two people to help with bathing/dressing/bathroom;Supervision due to cognitive status   Can travel by private vehicle     No  Equipment Recommendations  Rolling walker (2 wheels);BSC/3in1    Recommendations for Other Services       Precautions / Restrictions Precautions Precautions: Fall;Other (comment) Recall of Precautions/Restrictions: Impaired Precaution/Restrictions Comments: bil JP drains, RLQ urostomy, cortrak, abd wound vac, bil perc nephrostomy drains     Mobility  Bed Mobility Overal bed mobility: Needs Assistance Bed Mobility: Supine to Sit, Sit to Supine           General bed mobility comments: foot egress positioning to achieve supine<>sitting. UB on side rails with mod +2 assist to shift trunk anteriorly to full sitting. pt able to assist with  RUE and feet to assist to slide toward HOB max +2, mod assist to roll to right with sequential cues    Transfers Overall transfer level: Needs assistance   Transfers: Sit to/from Stand Sit to Stand: Mod assist, +2 physical assistance, From elevated surface           General transfer comment: foot egress with standing x 2 trials with assist of pt holding onto therapist and pad at sacrum. max standing 10 sec due to fatigue. max cues for upright trunk, neck extension and posture    Ambulation/Gait                   Stairs             Wheelchair Mobility     Tilt Bed    Modified Rankin (Stroke Patients Only)       Balance Overall balance assessment: Needs assistance   Sitting balance-Leahy Scale: Fair Sitting balance - Comments: able to static sit EOB without UB support   Standing balance support: Bilateral upper extremity supported Standing balance-Leahy Scale: Poor                              Communication Communication Communication: No apparent difficulties  Cognition Arousal: Alert Behavior During Therapy: Flat affect   PT - Cognitive impairments: Memory, Safety/Judgement, Problem solving                       PT - Cognition Comments: slow to follow commands, increased time for initiation, flat affect Following commands: Impaired Following commands impaired: Follows one step  commands with increased time    Cueing Cueing Techniques: Verbal cues, Gestural cues, Tactile cues  Exercises General Exercises - Lower Extremity Short Arc Quad: AAROM, Both, 10 reps, Seated Hip Flexion/Marching: AAROM, Both, 10 reps, Seated    General Comments        Pertinent Vitals/Pain Pain Assessment Pain Score: 5  Pain Location: back and abdomen with movement Pain Descriptors / Indicators: Grimacing, Discomfort, Aching Pain Intervention(s): Limited activity within patient's tolerance, Monitored during session, Repositioned    Home  Living                          Prior Function            PT Goals (current goals can now be found in the care plan section) Progress towards PT goals: Progressing toward goals    Frequency    Min 2X/week      PT Plan      Co-evaluation              AM-PAC PT 6 Clicks Mobility   Outcome Measure  Help needed turning from your back to your side while in a flat bed without using bedrails?: A Lot Help needed moving from lying on your back to sitting on the side of a flat bed without using bedrails?: Total Help needed moving to and from a bed to a chair (including a wheelchair)?: Total Help needed standing up from a chair using your arms (e.g., wheelchair or bedside chair)?: Total Help needed to walk in hospital room?: Total Help needed climbing 3-5 steps with a railing? : Total 6 Click Score: 7    End of Session Equipment Utilized During Treatment: Other (comment) (TED hose) Activity Tolerance: Patient tolerated treatment well Patient left: in bed;with call bell/phone within reach;with family/visitor present Nurse Communication: Mobility status;Need for lift equipment PT Visit Diagnosis: Unsteadiness on feet (R26.81);Other abnormalities of gait and mobility (R26.89);Muscle weakness (generalized) (M62.81);Difficulty in walking, not elsewhere classified (R26.2)     Time: 9196-9168 PT Time Calculation (min) (ACUTE ONLY): 28 min  Charges:    $Therapeutic Activity: 23-37 mins PT General Charges $$ ACUTE PT VISIT: 1 Visit                     [name], PT Acute Rehabilitation Services Office: [phone]    Lenoard NOVAK Afton Lavalle 05/05/2024, 10:25 AM

## 2024-05-05 NOTE — Progress Notes (Signed)
 Speech Language Pathology Treatment: Dysphagia  Patient Details Name: Kristopher Thompson MRN: [id number] DOB: [birth year] Today's Date: 05/05/2024 Time: 1157-1205 SLP Time Calculation (min) (ACUTE ONLY): 8 min  Assessment / Plan / Recommendation Clinical Impression  Pt's diet upgraded to soft by MD this date. Pt was much more attentive to POs today, making mastication increasingly efficient. No s/s of aspiration were observed with consecutive straw sips of thin liquids. Recommend continuing current diet without ongoing SLP f/u. Education completed with pt and his wife. Will sign off at this time.    HPI HPI: Kristopher Thompson is a 73 yo male s/p hernia repair and urostomy revision on 5/30. SLP assessed 6/7 with recommendations to initiate Dys 2 diet with thin liquids as well as aspiration and esophageal precautions. Returned to OR 6/9 for perforated bowel, post op shock, s/p exploratory lapartomy with drainage of R abdominal wall abscess on 6/9. CRRT 6/11-6/14, restarted 6/17-6/20. Extubated 6/14. Transferred to [facility] 6/15 for intermittent HD. PMH includes CHF, colostomy, DM, GERD, abdominal surgery.      SLP Plan  All goals met          Recommendations  Diet recommendations: Regular;Thin liquid Liquids provided via: Cup;Straw Medication Administration: Whole meds with liquid Supervision: Staff to assist with self feeding;Full supervision/cueing for compensatory strategies Compensations: Minimize environmental distractions;Slow rate;Small sips/bites;Clear throat intermittently;Multiple dry swallows after each bite/sip;Monitor for anterior loss Postural Changes and/or Swallow Maneuvers: Seated upright 90 degrees;Upright 30-60 min after meal                  Oral care BID   Frequent or constant Supervision/Assistance Dysphagia, unspecified (R13.10)     All goals met     Damien Blumenthal, M.A., CCC-SLP Speech Language Pathology, Acute Rehabilitation Services  Secure Chat  preferred [phone]   05/05/2024, 12:06 PM

## 2024-05-05 NOTE — Progress Notes (Signed)
 PHARMACY - ANTICOAGULATION CONSULT NOTE  Pharmacy Consult for Heparin  Indication: h/o PE  Allergies  Allergen Reactions   Demerol  [Meperidine ] Nausea And Vomiting and Other (See Comments)    SEVERE NAUSEA    Patient Measurements: Height: 5' 11 (180.3 cm) Weight: 44.9 kg (98 lb 14.4 oz) IBW/kg (Calculated) : 75.3 HEPARIN  DW (KG): 100.8  Vital Signs: Temp: 97.9 F (36.6 C) (06/24 0717) Temp Source: Axillary (06/24 0717) BP: 104/70 (06/24 0600) Pulse Rate: 106 (06/24 0600)  Labs: Recent Labs    05/03/24 0453 05/04/24 0341 05/04/24 1020 05/05/24 0425 05/05/24 0426  HGB  --  9.1*  --   --   --   HCT  --  28.0*  --   --   --   PLT  --  136*  --   --   --   LABPROT  --   --  14.2  --   --   INR  --   --  1.1  --   --   HEPARINUNFRC 0.54 0.58  --   --  0.43  CREATININE 3.60* 3.86*  --  2.53*  --     Estimated Creatinine Clearance: 17 mL/min (A) (by C-G formula based on SCr of 2.53 mg/dL (H)).   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   moderate atypia - right low back   Atypical nevus 04/04/2007   moderate to marked - right upper back (wider shave)   Atypical nevus 04/04/2007   moderate atypia - center chest (wider shave)   Atypical nevus 04/04/2007   slight atypia - right thigh   Atypical nevus 11/29/2011   mild atypia - center upper back   Atypical nevus 11/29/2011   mild atypia - center chest   Bacteremia due to Klebsiella pneumoniae 10/09/2017   Bladder cancer (HCC) dx 07/2017   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 denies   Sleep apnea    cpap   Squamous cell carcinoma of skin 05/22/2013   left cheek - CX3 + 5FU   Wears glasses     Assessment:  Patient with prolonged hospitalization including post-op complications resulting in bowel perforation with open abdomen. Has been closed and transferred to [facility]. On Eliquis PTA for hx of PE. Has been on heparin  gtt since 6/6. Nephro started ESA + IV iron  x1, 6/21 Fe 54, TSAT 19, Ferr 588  AM heparin  level therapeutic at 0.43, 6/23 HgB 9.1 and PLTs 136. Had some bleeding in ileostomy on 6/20 but no additional noted.   Goal of Therapy:  Heparin  level 0.3-0.7 units/ml Monitor platelets by anticoagulation protocol: Yes   Plan:  Continue heparin  1900u/hr.  CBC tomorrow.  Monitor for s/sx of bleeding.  Heparin   level daily Continue to hold DOAC - dysphagia 3 diet started, potential for calorie counts for determine need for G-tube.   Powell Blush, PharmD, BCCCP  05/05/24 7:57 AM

## 2024-05-05 NOTE — TOC Progression Note (Signed)
 Transition of Care [facility]) - Progression Note    Patient Details  Name: Kristopher Thompson MRN: [id number] Date of Birth: October 14, 1951  Transition of [facility] CM/SW Contact  Lauraine FORBES Saa, LCSW Phone Number: 05/05/2024, 1:13 PM  Clinical Narrative:     1:13 PM Per progressions, patient is not yet medically ready for discharge to [facility] TOC will continue to follow.  Expected Discharge Plan: Skilled Nursing Facility Barriers to Discharge: Continued Medical Work up  Expected Discharge Plan and Services In-house Referral: Clinical Social Work Discharge Planning Services: CM Consult Post Acute Care Choice: Skilled Nursing Facility Living arrangements for the past 2 months: Single Family Home                                       Social Determinants of Health (SDOH) Interventions SDOH Screenings   Food Insecurity: No Food Insecurity (04/12/2024)  Housing: Low Risk  (04/12/2024)  Transportation Needs: No Transportation Needs (04/12/2024)  Utilities: Not At Risk (04/12/2024)  Social Connections: Socially Integrated (04/10/2024)  Tobacco Use: Medium Risk (04/10/2024)    Readmission Risk Interventions    04/12/2024    4:58 PM  Readmission Risk Prevention Plan  Transportation Screening Complete  PCP or Specialist Appt within 3-5 Days Complete  HRI or Home Care Consult Complete  Social Work Consult for Recovery Care Planning/Counseling Complete  Palliative Care Screening Not Applicable  Medication Review Oceanographer) Complete

## 2024-05-05 NOTE — Progress Notes (Addendum)
 Occupational Therapy Treatment Patient Details Name: Kristopher FULLILOVE MRN: [id number] DOB: 12-19-1950 Today's Date: 05/05/2024   History of present illness 73 y/o M admitted to [facility] 5/30 for hernia repair and urostomy revision. 6/9 to OR for perforated bowel, post op shock, s/p exploratory lapartomy with drainage of Rt abdominal wall abscess. CRRT 6/11-6/14. Extubated 6/14, Transferred to [facility] 6/15 for intermittent HD. CRRT again 6/17-6/20. 6/23 bil perc nephrostomy tubes placed. PMHx: bladder and prostate CA, RBBB, CHF, colostomy, DM, GERD, abdominal surgery.   OT comments  Pt progressing toward goals this session, needing mod -mod +2 for bed mobility, able to sit EOB x10 min. Pt reports dizziness, BP WNL. Pt overall flat affect, disoriented to date, and noted to have decr initiation with BADL/mobility tasks. Pt presenting with impairments listed below, will follow acutely. Patient will benefit from continued inpatient follow up therapy, <3 hours/day to maximize safety/ind with ADL/functional mobility.       If plan is discharge home, recommend the following:  Two people to help with walking and/or transfers;Two people to help with bathing/dressing/bathroom;Assistance with cooking/housework;Assistance with feeding;Direct supervision/assist for medications management;Direct supervision/assist for financial management;Assist for transportation;Help with stairs or ramp for entrance   Equipment Recommendations  Other (comment) (defer)    Recommendations for Other Services PT consult    Precautions / Restrictions Precautions Precautions: Fall;Other (comment) Recall of Precautions/Restrictions: Impaired Precaution/Restrictions Comments: bil JP drains, RLQ urostomy, cortrak, abd wound vac, bil perc nephrostomy drains Restrictions Weight Bearing Restrictions Per Provider Order: No       Mobility Bed Mobility Overal bed mobility: Needs Assistance Bed Mobility: Supine to Sit, Sit to Supine    Sidelying to sit: Mod assist     Sit to sidelying: Mod assist, +2 for physical assistance      Transfers                   General transfer comment: deferred     Balance Overall balance assessment: Needs assistance Sitting-balance support: Bilateral upper extremity supported, Feet unsupported Sitting balance-Leahy Scale: Fair     Standing balance support: Bilateral upper extremity supported Standing balance-Leahy Scale: Poor                             ADL either performed or assessed with clinical judgement   ADL Overall ADL's : Needs assistance/impaired         Upper Body Bathing: Maximal assistance;Sitting Upper Body Bathing Details (indicate cue type and reason): washing back EOB                                Extremity/Trunk Assessment Upper Extremity Assessment Upper Extremity Assessment: Generalized weakness   Lower Extremity Assessment Lower Extremity Assessment: Defer to PT evaluation        Vision   Vision Assessment?: No apparent visual deficits   Perception Perception Perception: Not tested   Praxis Praxis Praxis: Not tested   Communication Communication Communication: No apparent difficulties   Cognition Arousal: Alert Behavior During Therapy: Flat affect Cognition: Cognition impaired   Orientation impairments: Situation, Time Awareness: Online awareness impaired Memory impairment (select all impairments): Short-term memory Attention impairment (select first level of impairment): Focused attention Executive functioning impairment (select all impairments): Initiation OT - Cognition Comments: hesitant and requires incr enocuragement due to fear of falling  Following commands: Impaired Following commands impaired: Follows one step commands with increased time      Cueing   Cueing Techniques: Verbal cues, Gestural cues, Tactile cues  Exercises Other Exercises Other Exercises:  seated leg kicks x10    Shoulder Instructions       General Comments Pt reports dizziness, BP 113/79 (87) sitting EOB    Pertinent Vitals/ Pain       Pain Assessment Pain Assessment: Faces Pain Score: 4  Faces Pain Scale: Hurts little more Pain Location: back and abdomen with movement Pain Descriptors / Indicators: Grimacing, Discomfort, Aching Pain Intervention(s): Limited activity within patient's tolerance, Monitored during session, Repositioned  Home Living                                          Prior Functioning/Environment              Frequency  Min 2X/week        Progress Toward Goals  OT Goals(current goals can now be found in the care plan section)  Progress towards OT goals: Progressing toward goals  Acute Rehab OT Goals Patient Stated Goal: none stated OT Goal Formulation: With patient Time For Goal Achievement: 05/13/24 Potential to Achieve Goals: Fair ADL Goals Pt Will Perform Grooming: with min assist;bed level;sitting Pt/caregiver will Perform Home Exercise Program: Both right and left upper extremity;Increased strength;Increased ROM;With Supervision Additional ADL Goal #1: pt will perform bed mobility mod A in prep for seated ADLs  Plan      Co-evaluation                 AM-PAC OT 6 Clicks Daily Activity     Outcome Measure   Help from another person eating meals?: A Little Help from another person taking care of personal grooming?: A Little Help from another person toileting, which includes using toliet, bedpan, or urinal?: Total Help from another person bathing (including washing, rinsing, drying)?: A Lot Help from another person to put on and taking off regular upper body clothing?: A Lot Help from another person to put on and taking off regular lower body clothing?: A Lot 6 Click Score: 13    End of Session    OT Visit Diagnosis: Unsteadiness on feet (R26.81);Other abnormalities of gait and mobility  (R26.89);Muscle weakness (generalized) (M62.81)   Activity Tolerance Patient limited by lethargy;Patient limited by fatigue   Patient Left in bed;with call bell/phone within reach;with bed alarm set;with family/visitor present   Nurse Communication Mobility status; L JP drain became detached from bulb        Time: 8651-8586 OT Time Calculation (min): 25 min  Charges: OT General Charges $OT Visit: 1 Visit OT Treatments $Therapeutic Activity: 23-37 mins  Imari Reen K, OTD, OTR/L SecureChat Preferred Acute Rehab [phone]   Laneta POUR Koonce 05/05/2024, 2:40 PM

## 2024-05-05 NOTE — Progress Notes (Signed)
 NAME:  Kristopher Thompson, MRN:  [id number], DOB:  1951/03/25, LOS: 25 ADMISSION DATE:  04/10/2024, CONSULTATION DATE:  04/13/2024 REFERRING MD:  Dr Celinda, CHIEF COMPLAINT: Sepsis  History of Present Illness:  S/p repair of incisional/parastomal and ventral hernias, left inguinal incarcerated , abdominal hernias, lysis of adhesions, urostomy revision  Transferred to the ICU secondary to tachycardia, tachypnea  History of bladder cancer, prostate cancer, diastolic heart failure, diabetes, right bundle branch block, multiple abdominal surgeries  Update 04/20/24: ccm reconsulted post operatively after pt had perforated bowel and returned to OR. Pt was requiring neo and norepi. He remains intubated at this time. Profoundly acidotic with bicarb of 12.  Started on sodium bicarb infusion and bolus prior to initiation. Follow labs in am. Updated wife and son at bedside.   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   moderate atypia - right low back   Atypical nevus 04/04/2007   moderate to marked - right upper back (wider shave)   Atypical nevus 04/04/2007   moderate atypia - center chest (wider shave)   Atypical nevus 04/04/2007   slight atypia - right thigh   Atypical nevus 11/29/2011   mild atypia - center upper back   Atypical nevus 11/29/2011   mild atypia - center chest   Bacteremia due to Klebsiella pneumoniae 10/09/2017   Bladder cancer (HCC) dx 07/2017   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 denies   Sleep apnea    cpap   Squamous cell carcinoma of skin 05/22/2013   left cheek - CX3 + 5FU   Wears glasses    Significant Hospital Events: Including procedures, antibiotic start and stop dates in addition to other pertinent events   04/10/2024-laparoscopic surgery 04/13/24 CCM consult. Incr NE. Art line  6/3 weaning NE. Gently diuresed w 3L off  6/4 off NE. Pulled out his NGT refused replacement. After multiple emesis  6/5 cont NGT 6/6 tx to med surg 6/9: taken back to OR for perforated bowel, post-op shock 6/10: Norepinephrine , Phenylephrine , and vasopressin , added steroids, 1u PRBCs, back to OR for washout and closure 6/11: Worsening AKI with fluid overload, start CRRT, decreasing pressor requirements 6/13 tolerating CRRT 6/14: Off CRRT, arousable.  Tolerated weaning for most of the day 6/13 6/14: Extubated, CRRT stopped. 6/15 transferred to [facility] for iHD; montoring for renal recovery 6/16 iHD, severely encephalopathic with high BUN 6/17 restarted CRRT for to ongoing uremia and encephalopathy 6/18 was awake enough to work with PT, OT  Interim History / Subjective:  Little tachy, PCN tubes placed, went well, more interactive today  Objective    Blood pressure 112/69, pulse (!) 103, temperature 97.9 F (36.6 C), temperature source Axillary, resp.  rate (!) 22, height 5' 11 (1.803 m), weight 44.9 kg, SpO2 95%.        Intake/Output Summary (Last 24 hours) at 05/05/2024 1020 Last data filed at 05/05/2024 0600 Gross per 24 hour  Intake 1654.79 ml  Output 2315 ml  Net -660.21 ml   Filed Weights   05/03/24 0454 05/04/24 0500 05/04/24 2200  Weight: 105.8 kg 100.2 kg 44.9 kg    Examination: General:chronically ill, obese, resting comfortably HEENT: cortrak in place, dry mucus membranes Neuro:  awakens to voice, trackes, follows commands, lethargic but oriented Chest: non labored, diminished bilaterally, no wheeze Heart: Regular rate and rhythm, no murmurs or gallops Abdomen: wound vac in place with abdominal drains - JP drains with clear yellow output  Labs and images reviewed   Resolved problem list  Septic shock Acute respiratory failure with hypoxia  Assessment and Plan  Robotic hernia repair-- incisional, parastomal, left inguinal Septic shock due to acute peritonitis caused by bowel perforation - OR 6/9, back to the OR 6/10 for closure and ileal resection and end ileostomy creation General Surgery is Primary  Continue prolonged course of antibiotic therapy discussed between general surgery and ID Continue tube feeds 14 hours/day Encourage oral intake so we can stop tube feeds Wound VAC management per general surgery  Acute metabolic encephalopathy due to renal failure, uremia, ICU delirium Mental status has improved Avoid sedation  Acute kidney injury due to ischemic ATN from shock as well as what appears urinary obstruction/bilateral hydronephrosis Acute metabolic acidosis due to renal failure hyperphosphatemia Nephrology, urology both following, iHD 6/23 complicated by tachycardia and AMS transient likely vasovagel - suspect hypovolemia overall Monitoring UOP and labs S/p b/l perc neph tubes 6/23  Diabetes type 2 Blood sugars are controlled Continue insulin  with CBG goal 140-180  Acute blood loss anemia Right flank hematoma History of PE, was on Eliquis at home Hemoglobin stable Keep on heparin  for now given potential anticipated procedures.   Chronic HFpEF Looks euvolemic to hypovolemic, GDMT as tolerated  Moderate protein calorie malnutrition Obesity Continue dietary supplements Diet Counseling provided Continue full liquid diet  Deconditioning PT/OT evaluation  Best Practice (right click and Reselect all SmartList Selections daily)    Diet/type: tubefeeds dysphagia diet as tolerated DVT prophylaxis systemic heparin   Pressure ulcer(s): per nursing assessment GI prophylaxis: N/A Lines: Central line, PICC art line  Foley:  N/A urostomy  Code Status:  full code Last date of multidisciplinary goals of care discussion: 6/22 spouse updated  at bedside   Donnice JONELLE Beals, MD Pulmonary and Critical Care Medicine [facility] 05/05/2024 10:20 AM Pager: see AMION  If no response to pager, please call critical care on call (see AMION) until 7pm After 7:00 pm call Elink

## 2024-05-06 DIAGNOSIS — E119 Type 2 diabetes mellitus without complications: Secondary | ICD-10-CM | POA: Diagnosis not present

## 2024-05-06 DIAGNOSIS — G9341 Metabolic encephalopathy: Secondary | ICD-10-CM | POA: Diagnosis not present

## 2024-05-06 DIAGNOSIS — K631 Perforation of intestine (nontraumatic): Secondary | ICD-10-CM | POA: Diagnosis not present

## 2024-05-06 DIAGNOSIS — N179 Acute kidney failure, unspecified: Secondary | ICD-10-CM | POA: Diagnosis not present

## 2024-05-06 LAB — MAGNESIUM: Magnesium: 1.8 mg/dL (ref 1.7–2.4)

## 2024-05-06 LAB — CBC
HCT: 27.2 % — ABNORMAL LOW (ref 39.0–52.0)
Hemoglobin: 8.7 g/dL — ABNORMAL LOW (ref 13.0–17.0)
MCH: 29.4 pg (ref 26.0–34.0)
MCHC: 32 g/dL (ref 30.0–36.0)
MCV: 91.9 fL (ref 80.0–100.0)
Platelets: 93 10*3/uL — ABNORMAL LOW (ref 150–400)
RBC: 2.96 MIL/uL — ABNORMAL LOW (ref 4.22–5.81)
RDW: 17.7 % — ABNORMAL HIGH (ref 11.5–15.5)
WBC: 11.6 10*3/uL — ABNORMAL HIGH (ref 4.0–10.5)
nRBC: 0.2 % (ref 0.0–0.2)

## 2024-05-06 LAB — RENAL FUNCTION PANEL
Albumin: 2.5 g/dL — ABNORMAL LOW (ref 3.5–5.0)
Anion gap: 12 (ref 5–15)
BUN: 78 mg/dL — ABNORMAL HIGH (ref 8–23)
CO2: 24 mmol/L (ref 22–32)
Calcium: 8.6 mg/dL — ABNORMAL LOW (ref 8.9–10.3)
Chloride: 96 mmol/L — ABNORMAL LOW (ref 98–111)
Creatinine, Ser: 2.27 mg/dL — ABNORMAL HIGH (ref 0.61–1.24)
GFR, Estimated: 30 mL/min — ABNORMAL LOW (ref >60)
Glucose, Bld: 154 mg/dL — ABNORMAL HIGH (ref 70–99)
Phosphorus: 3.9 mg/dL (ref 2.5–4.6)
Potassium: 3.7 mmol/L (ref 3.5–5.1)
Sodium: 132 mmol/L — ABNORMAL LOW (ref 135–145)

## 2024-05-06 LAB — HEPARIN LEVEL (UNFRACTIONATED): Heparin Unfractionated: 0.54 [IU]/mL (ref 0.30–0.70)

## 2024-05-06 LAB — GLUCOSE, CAPILLARY
Glucose-Capillary: 108 mg/dL — ABNORMAL HIGH (ref 70–99)
Glucose-Capillary: 145 mg/dL — ABNORMAL HIGH (ref 70–99)
Glucose-Capillary: 148 mg/dL — ABNORMAL HIGH (ref 70–99)
Glucose-Capillary: 149 mg/dL — ABNORMAL HIGH (ref 70–99)
Glucose-Capillary: 159 mg/dL — ABNORMAL HIGH (ref 70–99)
Glucose-Capillary: 184 mg/dL — ABNORMAL HIGH (ref 70–99)

## 2024-05-06 MED ORDER — VITAL 1.5 CAL PO LIQD
1440.0000 mL | ORAL | Status: DC
  Administered 2024-05-06 – 2024-05-07 (×2): 1440 mL
  Administered 2024-05-08: 440 mL
  Filled 2024-05-06 (×2): qty 1659

## 2024-05-06 MED ORDER — ENSURE PLUS HIGH PROTEIN PO LIQD
237.0000 mL | Freq: Two times a day (BID) | ORAL | Status: DC
  Administered 2024-05-06 – 2024-05-19 (×18): 237 mL via ORAL

## 2024-05-06 MED ORDER — INSULIN ASPART 100 UNIT/ML IJ SOLN
6.0000 [IU] | INTRAMUSCULAR | Status: DC
  Administered 2024-05-06 – 2024-05-10 (×11): 6 [IU] via SUBCUTANEOUS

## 2024-05-06 MED ORDER — PIPERACILLIN-TAZOBACTAM 3.375 G IVPB
3.3750 g | Freq: Three times a day (TID) | INTRAVENOUS | Status: AC
  Administered 2024-05-06 – 2024-05-18 (×38): 3.375 g via INTRAVENOUS
  Filled 2024-05-06 (×36): qty 50

## 2024-05-06 NOTE — Plan of Care (Signed)
  Problem: Education: Goal: Knowledge of General Education information will improve Description: Including pain rating scale, medication(s)/side effects and non-pharmacologic comfort measures Outcome: Progressing   Problem: Clinical Measurements: Goal: Ability to maintain clinical measurements within normal limits will improve Outcome: Progressing Goal: Will remain free from infection Outcome: Progressing Goal: Diagnostic test results will improve Outcome: Progressing   Problem: Safety: Goal: Ability to remain free from injury will improve Outcome: Progressing   Problem: Skin Integrity: Goal: Risk for impaired skin integrity will decrease Outcome: Progressing Note: Pt reluctant to be turned.  Educated provided and reinforced with pt more agreeable to turning.   Problem: Nutritional: Goal: Maintenance of adequate nutrition will improve Outcome: Progressing   Problem: Skin Integrity: Goal: Risk for impaired skin integrity will decrease Outcome: Progressing   Problem: Clinical Measurements: Goal: Respiratory complications will improve Outcome: Adequate for Discharge Goal: Cardiovascular complication will be avoided Outcome: Adequate for Discharge

## 2024-05-06 NOTE — Progress Notes (Signed)
 05/06/2024  Kristopher Thompson [id number] 1951-01-08  CARE TEAM: PCP: [name], MD  Outpatient Care Team: Patient Care Team: [name], MD as PCP - General (Internal Medicine) [name], MD (Inactive) as Consulting Physician (Dermatology) [name], MD as Referring Physician (Pulmonary Disease) [name], MD as Consulting Physician (General Surgery) [name], MD as Consulting Physician (Urology)  Inpatient Treatment Team: Treatment Team:  [name], MD [name], MD [name], MD Pccm, Md, MD Dolan Mateo Larger, MD Massie Delaine SAUNDERS, RN Norine Manuelita DELENA, MD Dea Shiner, MD Ruthellen Ruthellen Radiology, MD Glendia Dena BIRCH, RN Lossie Harlene FALCON, RN Cheryn Earla DELENA, RN Con, [place]  R, NT Apickup-Ot, A, OT   Problem List:   Principal Problem:   Delayed bowel perforation s/p SBR/end ileostomy Active Problems:   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 08/08/2017   S/P ileal conduit (HCC)   GERD (gastroesophageal reflux disease)   Obesity (BMI 35.0-39.9 without comorbidity)   Prolonged QT interval   Non-recurrent bilateral inguinal hernia without obstruction or gangrene   Incarcerated incisional hernia   Sinus tachycardia   Tachypnea   Acute respiratory insufficiency, postoperative   Sepsis due to undetermined organism (HCC)   Lactic acidosis   Class 2 obesity   Chronic anticoagulation   Hearing loss   History of bladder cancer   Obstructive sleep apnea of adult   Parastomal hernia of ileal conduit   Partial small bowel obstruction (HCC)   Personal history of PE (pulmonary embolism)   Pressure injury of skin   04/10/2024  POST-OPERATIVE DIAGNOSIS:  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   PROCEDURE:   -ROBOTIC LYSIS OF ADHESIONS X 4 HOURS -COMPONENT SEPARATION - TRANSVERSUS ABDOMINIS REALEASE (TAR) BILATERAL -ROBOTIC REPAIRS OF  INCISIONAL, PARASTOMAL, LEFT  INGUINAL  INCARCERATED ABDOMINAL WALL HERNIAS WITH MESH -UROSTOMY ILEAL CONDUIT REVISION -INTRAOPERATIVE ASSESSMENT OF TISSUE VASCULAR PERFUSION USING ICG (indocyanine green ) -IMMUNOFLUORESCENCE -TRANSVERSUS ABDOMINIS PLANE (TAP) BLOCK - BILATERAL    SURGEON:  [name], MD   04/20/2024 POST-OPERATIVE DIAGNOSIS:  perforated bowel   PROCEDURE:  Drainage of right abdominal wall abscess as well as intra-abdominal abscess Explantation of abdominal mesh Small bowel resection (in discontinuity) Placement of ABThera wound VAC   SURGEON:  [name], MD  04/21/2024  POST-OPERATIVE DIAGNOSIS:  DELAYED BOWEL PERFORATION WITH OPEN ABDOMEN   PROCEDURE:   LYSIS OF ADHESIONS X ABDOMINAL WALL DEBRIDEMENT ILEAL RESECTION END ILEOSTOMY ABDOMINAL WALL PARASTOMAL & INCISIONAL HERNIA REPAIRS WITH PHASIX MESH FASCIA CLOSURE WITH WOUND VAC PLACEMENT IN SQ   SURGEON:  [name], MD    05/04/2024 Interventional Radiology Procedure Note   Procedure: Bilateral percutaneous nephrostomy tube placements   Findings: Please refer to procedural dictation for full description. 10 Fr bilateral to bag drainage.   Complications: None immediate   Estimated Blood Loss: < 5 ml   Recommendations: Keep to bag drainage.  IR will follow.  Ultimate management as per Urology.   Assessment  Delayed small bowel perforation  Delayed urine leak of urostomy ileal conduit Multisystem organ failure gradually resolving  Plan:  Urinary diversion with percutaneous nephrostomy tubes.  Continue right lower quadrant abdominal wall and intraperitoneal drains for now.  If intraperitoneal remains low output most likely can remove that.  Nutrition:  Regular diet as tolerated.  Speech therapy concurs  Cycling tube feeds.  Try and do 12 hours.  Calorie counts and see if we can gradually wean the tube feeds off over the next  week if he eats better.  We will see.  Bowel regimen for ileostomy.   soluble polyfiber/FiberCon to thicken with nightly loperamide  seems to be working   [employer] 6/16   Hold off on G-tube unless poor PO effort >14days  Infection due to delayed bowel perforation and abdominal wall infection/necrosis  Growing multiple organisms including Candida albicans, Enterobacter, and Enterococcus.    IV Zosyn /micafungin .  Anticipate prolonged antibiotics given the abdominal wall infection/necrosis and need for Phasix mesh.   Change wound vac q MonWedFri the hopes for the subcutaneous tissues to recover.  Last change 6/23 showing early granulation without necrosis guardedly reassuring.      Metabolic encephalopathy slowly resolving.  Follow.  ABLA on top of anemia of chronic disease improved with transfusion.  Follow.    -monitor electrolytes & replace as needed.  Keep K>4, Mg>2, Phos>3.    -Diabetes.  Sliding scale insulin .    -VTE prophylaxis-  Full anticoagulation heparin  drip given history of pulmonary embolism,.  Could consider back on DOAC enteral route once off CRRTx  -Mobilize as tolerated.  Agree with critical care about having physical and Occupational Therapy get more involved now that he is more alert.  Hopefully can rebuild strength and minimize further atrophy.  Tentatively saying FL 2 discussion about rehab.  I do not want this patient going to an LTAC.  He is not ready.  I updated the patient's status to the the patient & ICU RN in room.  Discussed with Dr. Annella with critical care.  Recommendations were made.  Questions were answered.  They expressed understanding & appreciation.  -Disposition: He is going to be here a while       I reviewed nursing notes, last 24 h vitals and pain scores, last 48 h intake and output, last 24 h labs and trends, and last 24 h imaging results.  I have reviewed this patient's available data, including medical history, events of note, test results, etc as part of my evaluation.   A significant portion  of that time was spent in counseling. Care during the described time interval was provided by me.  This care required high  level of medical decision making.  05/06/2024    Subjective: (Chief complaint)  Awake most at night but sleeping now.  Wife at bedside.  Seen nurse at bedside.  No major events.  Trying some solid food.  No coughing or aspiration.  Objective:  Vital signs:  Vitals:   05/06/24 0348 05/06/24 0400 05/06/24 0500 05/06/24 0600  BP:  121/70 (!) 100/58 102/69  Pulse:  (!) 107 (!) 112 (!) 103  Resp:  19 18 17   Temp: 98.9 F (37.2 C)     TempSrc: Oral     SpO2:  97% 95% 96%  Weight:   100.4 kg   Height:        Last BM Date : 05/05/24  Intake/Output   Yesterday:  06/24 0701 - 06/25 0700 In: 1815.8 [I.V.:446.3; WH/HU:8836.6; IV Piggyback:206.2] Out: 2985 [Drains:2235; Stool:750] This shift:  Total I/O In: 1369.8 [I.V.:218.8; NG/GT:1100; IV Piggyback:51.1] Out: 1910 [Drains:1360; Stool:550]  Bowel function:  Flatus: YES  BM:  YES -small  Drains:  RLQ drain (rests between abdominal wall and Phasix mesh): Serous    LLQ drain (runs over bowel with tip down in RLQ pelvis near base of ileal conduit and site of bowel resection):  thinly serosanguinous   Nephrostomy tubes in place.  Right & Left light yellow-colored  Physical Exam:  General: Resting in no acute distress.   Eyes: PERRL, normal EOM.  Sclera clear.  No icterus Neuro: CN II-XII intact w/o focal sensory/motor deficits. Lymph: No head/neck/groin lymphadenopathy Psych:  No delerium/psychosis/paranoia.  No agitation HENT: Normocephalic, Mucus membranes moist.  No thrush.  ETT & NGT in place Neck: Supple, No tracheal deviation.  No obvious thyromegaly Chest: No pain to chest wall compression.  Good respiratory excursion.   CV:  Pulses intact.  Regular rhythm.  1-2+ BUE/BLE edema MS:  No obvious deformity  Abdomen:  Obese Soft.  Nondistended.  Nontender Wound vac (in SQ over closed  fascia) in midline.    RUQ: (End ileostomy): Green oatmeal thick effluent in bag RLQ:  (Urostomy ileal conduit): pink mucosa without any obvious separation.  Scant clear light yellow-colored urine  Ext:   No deformity.  Bilateral hand 0-1+ edema proved.  No lower extremity edema.  No cyanosis Skin: No petechiae / purpurea.  No major sores.  Warm and dry    Results:   Cultures: Recent Results (from the past 720 hours)  Culture, blood (Routine X 2) w Reflex to ID Panel     Status: None   Collection Time: 04/13/24  7:13 AM   Specimen: BLOOD  Result Value Ref Range Status   Specimen Description   Final    BLOOD BLOOD LEFT ARM AEROBIC BOTTLE ONLY ANAEROBIC BOTTLE ONLY Performed at [facility], [address] [place], [place] [address]    Special Requests   Final    BOTTLES DRAWN AEROBIC AND ANAEROBIC Blood Culture results may not be optimal due to an inadequate volume of blood received in culture bottles Performed at [facility], [address], [place], [place] [address]    Culture   Final    NO GROWTH 5 DAYS Performed at [facility], [address] [place], [place] [address]    Report Status 04/18/2024 FINAL  Final  Culture, blood (Routine X 2) w Reflex to ID Panel     Status: None   Collection Time: 04/13/24  7:20 AM   Specimen: BLOOD  Result Value Ref Range Status   Specimen Description   Final    BLOOD BLOOD RIGHT ARM AEROBIC BOTTLE ONLY ANAEROBIC BOTTLE ONLY Performed at [facility], [address] [place], [place] [address]    Special Requests   Final    BOTTLES DRAWN AEROBIC AND ANAEROBIC Blood Culture results may not be optimal due to an inadequate volume of blood received in culture bottles Performed at [facility], [address], [place], [place] [address]    Culture   Final    NO GROWTH 5 DAYS Performed at [facility], [address], [place], [place] [address]     Report Status 04/18/2024 FINAL  Final  Urine Culture     Status: None   Collection Time: 04/13/24 10:18 AM   Specimen: Urine, Clean Catch  Result Value Ref Range Status   Specimen Description   Final    URINE, CLEAN CATCH Performed at [facility], [address] [place], [place] [address]    Special Requests   Final    NONE Performed at [facility], [address] [place], [place] [address]    Culture   Final    NO GROWTH Performed at [facility], [address], [place], [place] [address]    Report Status 04/14/2024 FINAL  Final  MRSA Next Gen by PCR, Nasal     Status: None   Collection Time: 04/13/24 11:35 AM   Specimen: Nasal Mucosa; Nasal Swab  Result Value Ref Range Status   MRSA by PCR Next Gen NOT DETECTED NOT DETECTED Final    Comment: (NOTE) The GeneXpert MRSA Assay (FDA approved for NASAL specimens only), is one component of a comprehensive MRSA colonization surveillance program. It is not intended to diagnose MRSA infection nor to guide or monitor treatment for MRSA infections. Test performance is not FDA approved in patients less than 31 years old. Performed at [facility], [address] [place], [place] [address]   Culture, blood (Routine X 2) w Reflex to ID Panel     Status: None   Collection Time: 04/20/24  8:46 AM   Specimen: BLOOD RIGHT ARM  Result Value Ref Range Status   Specimen Description   Final    BLOOD RIGHT ARM Performed at [facility], [address], [place], [place] [address]    Special Requests   Final    BOTTLES DRAWN AEROBIC AND ANAEROBIC Blood Culture results may not be optimal due to an inadequate volume of blood received in culture bottles Performed at [facility], [address], [place], [place] [address]    Culture   Final    NO GROWTH 5 DAYS Performed at [facility], [address], [place], [place] [address]    Report Status  04/25/2024 FINAL  Final  Culture, blood (Routine X 2) w Reflex to ID Panel     Status: None   Collection Time: 04/20/24  8:49 AM   Specimen: BLOOD  Result Value Ref Range Status   Specimen Description   Final    BLOOD Performed at [facility], [address], [place], [place] [address]    Special Requests   Final    BOTTLES DRAWN AEROBIC AND ANAEROBIC Blood Culture results may not be optimal due to an inadequate volume of blood received in culture bottles Performed at [facility], [address] [place], [place] [address]    Culture   Final    NO GROWTH 5 DAYS Performed at [facility], [address] [place], [place] [address]    Report Status 04/25/2024 FINAL  Final  Culture, Respiratory w Gram Stain     Status: None   Collection Time: 04/20/24 12:03 PM   Specimen: Tracheal Aspirate; Respiratory  Result Value Ref Range Status   Specimen Description   Final    TRACHEAL ASPIRATE Performed at [facility], [address], [place], [place] [address]    Special Requests   Final    NONE Performed at [facility], [address], [place], [place] [address]    Gram Stain NO WBC SEEN RARE GRAM POSITIVE COCCI   Final   Culture   Final    RARE Normal respiratory flora-no Staph aureus or Pseudomonas seen Performed at [facility], [address] [place], [place] [address]    Report Status 04/23/2024 FINAL  Final  Aerobic/Anaerobic Culture w Gram Stain (surgical/deep wound)     Status: None   Collection Time: 04/21/24  2:23 PM   Specimen: Soft Tissue, Other  Result Value Ref Range Status   Specimen Description   Final    TISSUE INTRA ABDOMINAL NECROTIC TISSUE Performed at [facility], [address], [place], [place] [address]  Special Requests   Final    NONE Performed at [facility], [address] [place], [place] [address]    Gram Stain   Final    FEW  WBC PRESENT,BOTH PMN AND MONONUCLEAR RARE GRAM POSITIVE COCCI IN PAIRS AND CHAINS RARE YEAST    Culture   Final    RARE ESCHERICHIA COLI FEW CANDIDA ALBICANS FEW ENTEROCOCCUS FAECALIS FEW BACTEROIDES SPECIES BETA LACTAMASE POSITIVE Performed at [facility], [address] [place], [place] [address]    Report Status 04/25/2024 FINAL  Final   Organism ID, Bacteria ESCHERICHIA COLI  Final   Organism ID, Bacteria ENTEROCOCCUS FAECALIS  Final      Susceptibility   Escherichia coli - MIC*    AMPICILLIN >=32 RESISTANT Resistant     CEFEPIME  <=0.12 SENSITIVE Sensitive     CEFTAZIDIME <=1 SENSITIVE Sensitive     CEFTRIAXONE  1 SENSITIVE Sensitive     CIPROFLOXACIN  <=0.25 SENSITIVE Sensitive     GENTAMICIN  <=1 SENSITIVE Sensitive     IMIPENEM <=0.25 SENSITIVE Sensitive     TRIMETH /SULFA  <=20 SENSITIVE Sensitive     AMPICILLIN/SULBACTAM >=32 RESISTANT Resistant     PIP/TAZO 8 SENSITIVE Sensitive ug/mL    * RARE ESCHERICHIA COLI   Enterococcus faecalis - MIC*    AMPICILLIN <=2 SENSITIVE Sensitive     VANCOMYCIN  1 SENSITIVE Sensitive     GENTAMICIN  SYNERGY SENSITIVE Sensitive     * FEW ENTEROCOCCUS FAECALIS    Labs: Results for orders placed or performed during the hospital encounter of 04/10/24 (from the past 48 hours)  Glucose, capillary     Status: Abnormal   Collection Time: 05/04/24  7:46 AM  Result Value Ref Range   Glucose-Capillary 161 (H) 70 - 99 mg/dL    Comment: Glucose reference range applies only to samples taken after fasting for at least 8 hours.  Protime-INR     Status: None   Collection Time: 05/04/24 10:20 AM  Result Value Ref Range   Prothrombin Time 14.2 11.4 - 15.2 seconds   INR 1.1 0.8 - 1.2    Comment: (NOTE) INR goal varies based on device and disease states. Performed at [facility], [address], [place], [place] [address]   Glucose, capillary     Status: Abnormal   Collection Time: 05/04/24 11:23 AM  Result Value Ref Range    Glucose-Capillary 129 (H) 70 - 99 mg/dL    Comment: Glucose reference range applies only to samples taken after fasting for at least 8 hours.  Glucose, capillary     Status: Abnormal   Collection Time: 05/04/24  3:49 PM  Result Value Ref Range   Glucose-Capillary 151 (H) 70 - 99 mg/dL    Comment: Glucose reference range applies only to samples taken after fasting for at least 8 hours.  Glucose, capillary     Status: Abnormal   Collection Time: 05/04/24  7:27 PM  Result Value Ref Range   Glucose-Capillary 151 (H) 70 - 99 mg/dL    Comment: Glucose reference range applies only to samples taken after fasting for at least 8 hours.  Glucose, capillary     Status: Abnormal   Collection Time: 05/04/24 11:18 PM  Result Value Ref Range   Glucose-Capillary 127 (H) 70 - 99 mg/dL    Comment: Glucose reference range applies only to samples taken after fasting for at least 8 hours.  Glucose, capillary     Status: Abnormal   Collection Time: 05/05/24  3:16 AM  Result Value Ref Range   Glucose-Capillary 179 (H) 70 - 99 mg/dL    Comment: Glucose reference range applies only to samples taken after fasting for at least 8 hours.  Renal function panel (daily at 0500)     Status: Abnormal   Collection Time: 05/05/24  4:25 AM  Result Value Ref Range   Sodium 134 (L) 135 - 145 mmol/L   Potassium 4.1 3.5 - 5.1 mmol/L   Chloride 97 (L) 98 - 111 mmol/L   CO2 26 22 - 32 mmol/L   Glucose, Bld 184 (H) 70 - 99 mg/dL    Comment: Glucose reference range applies only to samples taken after fasting for at least 8 hours.   BUN 72 (H) 8 - 23 mg/dL   Creatinine, Ser 7.46 (H) 0.61 - 1.24 mg/dL   Calcium  8.9 8.9 - 10.3 mg/dL   Phosphorus 5.2 (H) 2.5 - 4.6 mg/dL   Albumin  2.5 (L) 3.5 - 5.0 g/dL   GFR, Estimated 26 (L) >60 mL/min    Comment: (NOTE) Calculated using the CKD-EPI Creatinine Equation (2021)    Anion gap 11 5 - 15    Comment: Performed at [facility], [address] [place], [place] [address]   Magnesium      Status: None   Collection Time: 05/05/24  4:25 AM  Result Value Ref Range   Magnesium  2.0 1.7 - 2.4 mg/dL    Comment: Performed at [facility], [address] [place], [place] [address]  Heparin  level (unfractionated)     Status: None   Collection Time: 05/05/24  4:26 AM  Result Value Ref Range   Heparin  Unfractionated 0.43 0.30 - 0.70 IU/mL    Comment: (NOTE) The clinical reportable range upper limit is being lowered to >1.10 to align with the FDA approved guidance for the current laboratory assay.  If heparin  results are below expected values, and patient dosage has  been confirmed, suggest follow up testing of antithrombin III levels. Performed at [facility], [address], [place], [place] [address]   Glucose, capillary     Status: Abnormal   Collection Time: 05/05/24  7:15 AM  Result Value Ref Range   Glucose-Capillary 182 (H) 70 - 99 mg/dL    Comment: Glucose reference range applies only to samples taken after fasting for at least 8 hours.  Glucose, capillary     Status: Abnormal   Collection Time: 05/05/24 11:12 AM  Result Value Ref Range   Glucose-Capillary 111 (H) 70 - 99 mg/dL    Comment: Glucose reference range applies only to samples taken after fasting for at least 8 hours.  Glucose, capillary     Status: Abnormal   Collection Time: 05/05/24  3:08 PM  Result Value Ref Range   Glucose-Capillary 131 (H) 70 - 99 mg/dL    Comment: Glucose reference range applies only to samples taken after fasting for at least 8 hours.  Glucose, capillary     Status: Abnormal   Collection Time: 05/05/24  7:29 PM  Result Value Ref Range   Glucose-Capillary 121 (H) 70 - 99 mg/dL    Comment: Glucose reference range applies only to samples taken after fasting for at least 8 hours.  Glucose, capillary     Status: Abnormal   Collection Time: 05/05/24 11:32 PM  Result Value Ref Range   Glucose-Capillary 146 (H) 70 - 99 mg/dL    Comment: Glucose reference  range applies only to samples  taken after fasting for at least 8 hours.  Glucose, capillary     Status: Abnormal   Collection Time: 05/06/24  3:32 AM  Result Value Ref Range   Glucose-Capillary 184 (H) 70 - 99 mg/dL    Comment: Glucose reference range applies only to samples taken after fasting for at least 8 hours.  Heparin  level (unfractionated)     Status: None   Collection Time: 05/06/24  5:00 AM  Result Value Ref Range   Heparin  Unfractionated 0.54 0.30 - 0.70 IU/mL    Comment: (NOTE) The clinical reportable range upper limit is being lowered to >1.10 to align with the FDA approved guidance for the current laboratory assay.  If heparin  results are below expected values, and patient dosage has  been confirmed, suggest follow up testing of antithrombin III levels. Performed at [facility], [address], [place], [place] [address]     Imaging / Studies: IR NEPHROSTOMY PLACEMENT BILATERAL Result Date: 05/04/2024 INDICATION: 73 year old male with history of cystectomy with ileal conduit presenting with bilateral distal renal obstruction. EXAM: 1. ULTRASOUND GUIDANCE FOR PUNCTURE OF THE left RENAL COLLECTING SYSTEM 2. Left PERCUTANEOUS NEPHROSTOMY TUBE PLACEMENT. 3. ULTRASOUND GUIDANCE FOR PUNCTURE OF THE right RENAL COLLECTING SYSTEM 4. Right PERCUTANEOUS NEPHROSTOMY TUBE PLACEMENT. COMPARISON:  None Available. MEDICATIONS: The patient was currently receiving intravenous antibiotics as an inpatient. No additional antibiotics were administered. ANESTHESIA/SEDATION: Moderate (conscious) sedation was employed during this procedure. A total of Versed  2 mg and Fentanyl  100 mcg was administered intravenously. Moderate Sedation Time: 17 minutes. The patient's level of consciousness and vital signs were monitored continuously by radiology nursing throughout the procedure under my direct supervision. CONTRAST:  Fifteen mL Isovue  300 - administered into the renal collecting system FLUOROSCOPY  TIME:  Eleven mGy reference air kerma COMPLICATIONS: None immediate. PROCEDURE: The procedure, risks, benefits, and alternatives were explained to the patient. Questions regarding the procedure were encouraged and answered. The patient understands and consents to the procedure. A timeout was performed prior to the initiation of the procedure. The bilateral flank regions were prepped and draped in the usual sterile fashion and a sterile drape was applied covering the operative field. A sterile gown and sterile gloves were used for the procedure. Local anesthesia was provided with 1% Lidocaine  with epinephrine . Ultrasound was used to localize the left kidney. Under direct ultrasound guidance, a 20 gauge needle was advanced into the renal collecting system. An ultrasound image documentation was performed. Access within the collecting system was confirmed with the efflux of urine followed by limited contrast injection. Over a Nitrex wire, the tract was dilated with an Accustick stent. Next, under intermittent fluoroscpic guidance and over a short Amplatz wire, the track was dilated ultimately allowing placement of a 10-French percutaneous nephrostomy catheter which was advanced to the level of the renal pelvis where the coil was formed and locked. Contrast was injected and several spot fluoroscopic images were obtained in various obliquities. The catheter was secured at the skin with a Prolene retention suture and stat lock device and connected to a gravity bag was placed. Ultrasound was used to localize the right kidney. Under direct ultrasound guidance, a 20 gauge needle was advanced into the renal collecting system. An ultrasound image documentation was performed. Access within the collecting system was confirmed with the efflux of urine followed by limited contrast injection. Over a Nitrex wire, the tract was dilated with an Accustick stent. Next, under intermittent fluoroscpic guidance and over a short Amplatz  wire,  the track was dilated ultimately allowing placement of a 10-French percutaneous nephrostomy catheter which was advanced to the level of the renal pelvis where the coil was formed and locked. Contrast was injected and several spot fluoroscopic images were obtained in various obliquities. The catheter was secured at the skin with a Prolene retention suture and stat lock device and connected to a gravity bag was placed. Dressings were applied. The patient tolerated procedure well without immediate postprocedural complication. FINDINGS: Ultrasound scanning demonstrates a moderate to severely dilated bilateral collecting systems. Under a combination of ultrasound and fluoroscopic guidance, a posterior inferior calix was targeted allowing placement of bilateral 10-French percutaneous nephrostomy catheters with ends coiled and locked within the renal pelves. Contrast injection confirmed appropriate positioning. IMPRESSION: Successful ultrasound and fluoroscopic guided placement of a bilateral 10 French percutaneous nephrostomy tubes. Ester Sides, MD [facility] Electronically Signed   By: Ester Sides M.D.   On: 05/04/2024 16:17       Medications / Allergies: per chart  Antibiotics: Anti-infectives (From admission, onward)    Start     Dose/Rate Route Frequency Ordered Stop   05/01/24 1400  piperacillin -tazobactam (ZOSYN ) IVPB 2.25 g        2.25 g 100 mL/hr over 30 Minutes Intravenous Every 8 hours 05/01/24 0957     05/01/24 1045  micafungin  (MYCAMINE ) 100 mg in sodium chloride  0.9 % 100 mL IVPB        100 mg 105 mL/hr over 1 Hours Intravenous Every 24 hours 05/01/24 0949     04/29/24 1000  micafungin  (MYCAMINE ) 150 mg in sodium chloride  0.9 % 100 mL IVPB  Status:  Discontinued        150 mg 107.5 mL/hr over 1 Hours Intravenous Every 24 hours 04/28/24 1036 05/01/24 0949   04/28/24 1345  micafungin  (MYCAMINE ) 100 mg in sodium chloride   0.9 % 100 mL IVPB        100 mg 105 mL/hr over 1 Hours Intravenous  Once 04/28/24 1245 04/28/24 1354   04/28/24 1000  micafungin  (MYCAMINE ) 100 mg in sodium chloride  0.9 % 100 mL IVPB  Status:  Discontinued        100 mg 105 mL/hr over 1 Hours Intravenous Every 24 hours 04/27/24 1219 04/28/24 1036   04/21/24 1400  clindamycin  (CLEOCIN ) 900 mg, gentamicin  (GARAMYCIN ) 240 mg in sodium chloride  0.9 % 1,000 mL for intraperitoneal lavage  Status:  Discontinued         Irrigation To Surgery 04/21/24 1346 04/21/24 1618   04/20/24 1400  piperacillin -tazobactam (ZOSYN ) IVPB 3.375 g  Status:  Discontinued        3.375 g 12.5 mL/hr over 240 Minutes Intravenous Every 8 hours 04/20/24 0755 05/01/24 0957   04/20/24 1000  metroNIDAZOLE  (FLAGYL ) IVPB 500 mg  Status:  Discontinued        500 mg 100 mL/hr over 60 Minutes Intravenous Every 12 hours 04/20/24 0320 04/20/24 0752   04/20/24 1000  micafungin  (MYCAMINE ) 150 mg in sodium chloride  0.9 % 100 mL IVPB  Status:  Discontinued        150 mg 107.5 mL/hr over 1 Hours Intravenous Every 24 hours 04/20/24 0752 04/27/24 1219   04/20/24 0245  ceFEPIme  (MAXIPIME ) 2 g in sodium chloride  0.9 % 100 mL IVPB  Status:  Discontinued        2 g 200 mL/hr over 30 Minutes Intravenous Every 8 hours 04/20/24 0232 04/20/24 0752   04/20/24 0100  metroNIDAZOLE  (FLAGYL ) IVPB  500 mg        500 mg 100 mL/hr over 60 Minutes Intravenous On call to O.R. 04/20/24 0004 04/20/24 0100   04/14/24 1800  ceFEPIme  (MAXIPIME ) 2 g in sodium chloride  0.9 % 100 mL IVPB        2 g 200 mL/hr over 30 Minutes Intravenous Every 8 hours 04/14/24 1003 04/19/24 0957   04/14/24 1600  vancomycin  (VANCOCIN ) IVPB 1000 mg/200 mL premix  Status:  Discontinued        1,000 mg 200 mL/hr over 60 Minutes Intravenous Every 24 hours 04/13/24 1420 04/14/24 1000   04/14/24 1600  vancomycin  (VANCOREADY) IVPB 1500 mg/300 mL  Status:  Discontinued        1,500 mg 150 mL/hr over 120 Minutes Intravenous Every 24 hours  04/14/24 1002 04/16/24 0744   04/14/24 1200  vancomycin  (VANCOCIN ) IVPB 1000 mg/200 mL premix  Status:  Discontinued        1,000 mg 200 mL/hr over 60 Minutes Intravenous Every 24 hours 04/13/24 1053 04/13/24 1109   04/13/24 2200  ceFEPIme  (MAXIPIME ) 2 g in sodium chloride  0.9 % 100 mL IVPB  Status:  Discontinued        2 g 200 mL/hr over 30 Minutes Intravenous Every 12 hours 04/13/24 1036 04/13/24 1109   04/13/24 2200  ceFEPIme  (MAXIPIME ) 2 g in sodium chloride  0.9 % 100 mL IVPB  Status:  Discontinued        2 g 200 mL/hr over 30 Minutes Intravenous Every 12 hours 04/13/24 1425 04/14/24 1003   04/13/24 1500  vancomycin  (VANCOCIN ) 2,000 mg in sodium chloride  0.9 % 500 mL IVPB        2,000 mg 260 mL/hr over 120 Minutes Intravenous  Once 04/13/24 1409 04/13/24 1850   04/13/24 1500  ceFEPIme  (MAXIPIME ) 2 g in sodium chloride  0.9 % 100 mL IVPB  Status:  Discontinued        2 g 200 mL/hr over 30 Minutes Intravenous Every 12 hours 04/13/24 1420 04/13/24 1425   04/13/24 1500  metroNIDAZOLE  (FLAGYL ) IVPB 500 mg  Status:  Discontinued        500 mg 100 mL/hr over 60 Minutes Intravenous Every 12 hours 04/13/24 1420 04/17/24 0725   04/13/24 1200  vancomycin  (VANCOCIN ) 2,000 mg in sodium chloride  0.9 % 500 mL IVPB  Status:  Discontinued        2,000 mg 260 mL/hr over 120 Minutes Intravenous  Once 04/13/24 1052 04/13/24 1121   04/13/24 1100  metroNIDAZOLE  (FLAGYL ) IVPB 500 mg  Status:  Discontinued        500 mg 100 mL/hr over 60 Minutes Intravenous 2 times daily 04/13/24 1007 04/13/24 1109   04/13/24 1100  vancomycin  (VANCOCIN ) IVPB 1000 mg/200 mL premix  Status:  Discontinued        1,000 mg 200 mL/hr over 60 Minutes Intravenous  Once 04/13/24 1007 04/13/24 1020   04/13/24 1015  ceFEPIme  (MAXIPIME ) 2 g in sodium chloride  0.9 % 100 mL IVPB        2 g 200 mL/hr over 30 Minutes Intravenous STAT 04/13/24 1007 04/14/24 1721   04/10/24 2200  ceFAZolin  (ANCEF ) IVPB 2g/100 mL premix        2 g 200  mL/hr over 30 Minutes Intravenous Every 8 hours 04/10/24 1833 04/11/24 0531   04/10/24 0600  ceFAZolin  (ANCEF ) IVPB 2g/100 mL premix        2 g 200 mL/hr over 30 Minutes Intravenous On call to O.R. 04/10/24 0533 04/10/24  1526         Note: Portions of this report may have been transcribed using voice recognition software. Every effort was made to ensure accuracy; however, inadvertent computerized transcription errors may be present.   Any transcriptional errors that result from this process are unintentional.    Elspeth KYM Schultze, MD, FACS, MASCRS Esophageal, Gastrointestinal & Colorectal Surgery Robotic and Minimally Invasive Surgery  [facility] [address], [address] [place], [place] [address] [phone] Fax [phone] Main  CONTACT INFORMATION: Weekday (9AM-5PM): Call [facility] main office at [phone] Weeknight (5PM-9AM) or Weekend/Holiday: Check EPIC Web Links tab & use AMION (password  [email or web address]) for General Surgery [facility] coverage  Please, DO NOT use SecureChat  (it is not reliable communication to reach operating surgeons & will lead to a delay in care).   Epic staff messaging available for outptient concerns needing 1-2 business day response.      05/06/2024  6:53 AM

## 2024-05-06 NOTE — Progress Notes (Signed)
 Patient ID: Kristopher Thompson, male   DOB: April 27, 1951, 73 y.o.   MRN: [id number] S: No new complaints O:BP 122/64   Pulse 98   Temp 98.4 F (36.9 C) (Oral)   Resp 16   Ht 5' 11 (1.803 m)   Wt 100.4 kg   SpO2 95%   BMI 30.87 kg/m   Intake/Output Summary (Last 24 hours) at 05/06/2024 0857 Last data filed at 05/06/2024 0700 Gross per 24 hour  Intake 1916.63 ml  Output 2985 ml  Net -1068.37 ml   Intake/Output: I/O last 3 completed shifts: In: 3504.5 [I.V.:645.9; NG/GT:2503.3; IV Piggyback:355.3] Out: 4410 [Urine:550; Drains:2810; Stool:1050]  Intake/Output this shift:  No intake/output data recorded. Weight change: 55.5 kg Gen: NAD CVS:RRR Resp: CTA Abd: +BS, soft, ostomy in RLQ, wound vac in place Ext: no edema  Recent Labs  Lab 04/30/24 1611 05/01/24 0417 05/02/24 0515 05/03/24 0453 05/04/24 0341 05/05/24 0425 05/06/24 0500  NA 135 135 135 132* 133* 134* 132*  K 4.9 4.9 5.3* 5.4* 5.0 4.1 3.7  CL 105 105 102 101 99 97* 96*  CO2 23 25 23  20* 21* 26 24  GLUCOSE 145* 159* 123* 170* 166* 184* 154*  BUN 49* 38* 69* 96* 109* 72* 78*  CREATININE 1.58* 1.58* 2.96* 3.60* 3.86* 2.53* 2.27*  ALBUMIN  2.1* 2.0* 2.2* 2.3* 2.4* 2.5* 2.5*  CALCIUM  7.9* 7.9* 8.6* 8.4* 9.2 8.9 8.6*  PHOS 2.4* 2.2* 5.1* 6.5* 7.2* 5.2* 3.9   Liver Function Tests: Recent Labs  Lab 05/04/24 0341 05/05/24 0425 05/06/24 0500  ALBUMIN  2.4* 2.5* 2.5*   No results for input(s): LIPASE, AMYLASE in the last 168 hours. No results for input(s): AMMONIA in the last 168 hours. CBC: Recent Labs  Lab 04/30/24 0330 05/01/24 0417 05/01/24 1313 05/04/24 0341 05/06/24 0500  WBC 14.5* 13.6* 16.4* 12.4* 11.6*  HGB 8.4* 7.9* 8.2* 9.1* 8.7*  HCT 26.5* 25.1* 26.3* 28.0* 27.2*  MCV 91.4 90.6 92.0 90.9 91.9  PLT 217 135* 111* 136* 93*   Cardiac Enzymes: No results for input(s): CKTOTAL, CKMB, CKMBINDEX, TROPONINI in the last 168 hours. CBG: Recent Labs  Lab 05/05/24 1508 05/05/24 1929  05/05/24 2332 05/06/24 0332 05/06/24 0730  GLUCAP 131* 121* 146* 184* 145*    Iron  Studies: No results for input(s): IRON , TIBC, TRANSFERRIN, FERRITIN in the last 72 hours. Studies/Results: IR NEPHROSTOMY PLACEMENT BILATERAL Result Date: 05/04/2024 INDICATION: 73 year old male with history of cystectomy with ileal conduit presenting with bilateral distal renal obstruction. EXAM: 1. ULTRASOUND GUIDANCE FOR PUNCTURE OF THE left RENAL COLLECTING SYSTEM 2. Left PERCUTANEOUS NEPHROSTOMY TUBE PLACEMENT. 3. ULTRASOUND GUIDANCE FOR PUNCTURE OF THE right RENAL COLLECTING SYSTEM 4. Right PERCUTANEOUS NEPHROSTOMY TUBE PLACEMENT. COMPARISON:  None Available. MEDICATIONS: The patient was currently receiving intravenous antibiotics as an inpatient. No additional antibiotics were administered. ANESTHESIA/SEDATION: Moderate (conscious) sedation was employed during this procedure. A total of Versed  2 mg and Fentanyl  100 mcg was administered intravenously. Moderate Sedation Time: 17 minutes. The patient's level of consciousness and vital signs were monitored continuously by radiology nursing throughout the procedure under my direct supervision. CONTRAST:  Fifteen mL Isovue  300 - administered into the renal collecting system FLUOROSCOPY TIME:  Eleven mGy reference air kerma COMPLICATIONS: None immediate. PROCEDURE: The procedure, risks, benefits, and alternatives were explained to the patient. Questions regarding the procedure were encouraged and answered. The patient understands and consents to the procedure. A timeout was performed prior to the initiation of the procedure. The bilateral flank regions were prepped and draped in  the usual sterile fashion and a sterile drape was applied covering the operative field. A sterile gown and sterile gloves were used for the procedure. Local anesthesia was provided with 1% Lidocaine  with epinephrine . Ultrasound was used to localize the left kidney. Under direct ultrasound  guidance, a 20 gauge needle was advanced into the renal collecting system. An ultrasound image documentation was performed. Access within the collecting system was confirmed with the efflux of urine followed by limited contrast injection. Over a Nitrex wire, the tract was dilated with an Accustick stent. Next, under intermittent fluoroscpic guidance and over a short Amplatz wire, the track was dilated ultimately allowing placement of a 10-French percutaneous nephrostomy catheter which was advanced to the level of the renal pelvis where the coil was formed and locked. Contrast was injected and several spot fluoroscopic images were obtained in various obliquities. The catheter was secured at the skin with a Prolene retention suture and stat lock device and connected to a gravity bag was placed. Ultrasound was used to localize the right kidney. Under direct ultrasound guidance, a 20 gauge needle was advanced into the renal collecting system. An ultrasound image documentation was performed. Access within the collecting system was confirmed with the efflux of urine followed by limited contrast injection. Over a Nitrex wire, the tract was dilated with an Accustick stent. Next, under intermittent fluoroscpic guidance and over a short Amplatz wire, the track was dilated ultimately allowing placement of a 10-French percutaneous nephrostomy catheter which was advanced to the level of the renal pelvis where the coil was formed and locked. Contrast was injected and several spot fluoroscopic images were obtained in various obliquities. The catheter was secured at the skin with a Prolene retention suture and stat lock device and connected to a gravity bag was placed. Dressings were applied. The patient tolerated procedure well without immediate postprocedural complication. FINDINGS: Ultrasound scanning demonstrates a moderate to severely dilated bilateral collecting systems. Under a combination of ultrasound and fluoroscopic  guidance, a posterior inferior calix was targeted allowing placement of bilateral 10-French percutaneous nephrostomy catheters with ends coiled and locked within the renal pelves. Contrast injection confirmed appropriate positioning. IMPRESSION: Successful ultrasound and fluoroscopic guided placement of a bilateral 10 French percutaneous nephrostomy tubes. Ester Sides, MD [facility] Electronically Signed   By: Ester Sides M.D.   On: 05/04/2024 16:17    Chlorhexidine  Gluconate Cloth  6 each Topical Daily   Chlorhexidine  Gluconate Cloth  6 each Topical Q0600   darbepoetin (ARANESP ) injection - DIALYSIS  200 mcg Subcutaneous Q Fri-1800   feeding supplement (PROSource TF20)  60 mL Per Tube BID   feeding supplement (VITAL 1.5 CAL)  1,440 mL Per Tube Q24H   insulin  aspart  0-20 Units Subcutaneous Q4H   insulin  aspart  6 Units Subcutaneous 4 times per day   insulin  glargine-yfgn  25 Units Subcutaneous QHS   loperamide  HCl  2 mg Per Tube QHS   nutrition supplement (JUVEN)  1 packet Per Tube BID BM   mouth rinse  15 mL Mouth Rinse 4 times per day   polycarbophil  625 mg Per Tube BID   prochlorperazine   10 mg Intravenous Once   sevelamer  carbonate  800 mg Per Tube Q8H   sodium chloride  flush  5 mL Intracatheter Q8H    BMET    Component Value Date/Time   NA 132 (L) 05/06/2024 0500   K 3.7 05/06/2024 0500   CL 96 (L) 05/06/2024 0500  CO2 24 05/06/2024 0500   GLUCOSE 154 (H) 05/06/2024 0500   BUN 78 (H) 05/06/2024 0500   CREATININE 2.27 (H) 05/06/2024 0500   CALCIUM  8.6 (L) 05/06/2024 0500   GFRNONAA 30 (L) 05/06/2024 0500   GFRAA >60 12/09/2018 0549   CBC    Component Value Date/Time   WBC 11.6 (H) 05/06/2024 0500   RBC 2.96 (L) 05/06/2024 0500   HGB 8.7 (L) 05/06/2024 0500   HCT 27.2 (L) 05/06/2024 0500   PLT 93 (L) 05/06/2024 0500   MCV 91.9 05/06/2024 0500   MCH 29.4 05/06/2024 0500   MCHC 32.0 05/06/2024 0500   RDW  17.7 (H) 05/06/2024 0500   LYMPHSABS 0.8 04/21/2024 1715   MONOABS 0.9 04/21/2024 1715   EOSABS 0.0 04/21/2024 1715   BASOSABS 0.1 04/21/2024 1715   Assessment/ Plan: Pt is a 73 y.o. yo male  with  hypertension, DM, CAD, hx bladder cancer, CHF, right bundle branch block who had acute bowel perforation and status post OR for bowel repair on 6/8, lysis of adhesion, ileal resection end ileostomy hernia repairs, septic shock seen for AKI and oliguria.    # Acute kidney injury, oliguric with anasarca likely ischemic ATN due to septic shock and obstructive uropathy given bilateral hydronephrosis.  CRRT 6/11-6/14 -  Because of the patient's persistent altered mental status and high metabolic state with elevated BUN was put back on CRRT on 6/17- 6/20 -  then taken off- with rising BUN and crt .  Had HD 05/04/24 without UF or complications.  His UOP and BUN/Cr have continued to improve since last HD session.  Will remove temp cath today as it has been in for 13.  Hold off on a new HD catheter for now as it appears that he is recovering renal function.    # Bilateral hydronephrosis severe on the left side: S/p revision of ileal conduit 5/30,  urology following. Now no UOP brings to mind possible disruption-  checking surgical drain output for crt -  seemed to be indicative of leak-  have talked to GU-  s/p bilateral percutaneous nephrostomy tube placement by IR on 05/04/24.   # Acute bowel perforation is status post bowel repair ileal conduit and hernia repairs urostomy ileal conduit revision.  As per surgical team.    # HTN: Continue current medications and adjust volume status with dialysis as tolerated   # Acute hypoxic respiratory failure/VDRF per pulmonary team. Extubated 6/14, currently on nasal cannula   # Altered mental status: Likely multifactorial.  Uremia may have played some role.  Improving slowly -  a little more somnolent today - uremia may have something to do with it    # Anemia-  added ESA  -  iron  stores OK   # Hyperkalemia-  given lokelma - and follow after HD and PCN placement, now resolved  Fairy RONAL Sellar, MD [facility]

## 2024-05-06 NOTE — Progress Notes (Signed)
 Calorie Count Note  48 hour calorie count ordered  Pt now on regular diet with thin liquids. MD adjusted cyclic feeds to 12 hours and requested kcal count to determine how much PO pt was taking in.  Met with pt and wife in room, reviewed breakfast tray and placed documented meal ticket in envelope. Pt more alert today and di respond to my questions. Ate 25% of his bacon and french toast and 25% of his milk. Wife reports he is doing very well with fluid intake. Discussed nutrition supplements being a tool we could use to increase PO nutrition and aid in weaning TF. Pt agreeable to ensure enlive and one was provided.   Discussed change in TF regimen with RN and also discussed documenting intake for kcal count. Hung envelope on door. Will collect tickets in the AM to calculate total nutrition intake x 24 hours  Diet: Regular diet, thin liquids Supplements: Ensure Plus High Protein po BID   NUTRITION DIAGNOSIS:  Increased nutrient needs related to acute illness as evidenced by estimated needs. - remains applicable   GOAL:  Patient will meet greater than or equal to 90% of their needs - progressing  INTERVENTION:  Continue current diet as ordered Encourage PO intake when pt alert Ensure Plus High Protein po BID, each supplement provides 350 kcal and 20 grams of protein Continue the following until pt's PO intake improves.  Vital 1.5 at 120 ml/h x 12 hours (1440 ml per day) Prosource TF20 60 ml BID Provides 2320 kcal, 137 gm protein, 1100 ml free water  daily 1 packet Juven BID, each packet provides 95 calories, 2.5 grams of protein (collagen) + micronutrients to support wound healing    Vernell Lukes, RD, LDN, CNSC Registered Dietitian II Please reach out via secure chat

## 2024-05-06 NOTE — Progress Notes (Signed)
 PHARMACY - ANTICOAGULATION CONSULT NOTE  Pharmacy Consult for Heparin  Indication: h/o PE  Allergies  Allergen Reactions   Demerol  [Meperidine ] Nausea And Vomiting and Other (See Comments)    SEVERE NAUSEA    Patient Measurements: Height: 5' 11 (180.3 cm) Weight: 100.4 kg (221 lb 5.5 oz) IBW/kg (Calculated) : 75.3 HEPARIN  DW (KG): 100.8  Vital Signs: Temp: 98.4 F (36.9 C) (06/25 0728) Temp Source: Oral (06/25 0728) BP: 122/64 (06/25 0700) Pulse Rate: 98 (06/25 0700)  Labs: Recent Labs    05/04/24 0341 05/04/24 1020 05/05/24 0425 05/05/24 0426 05/06/24 0500  HGB 9.1*  --   --   --  8.7*  HCT 28.0*  --   --   --  27.2*  PLT 136*  --   --   --  93*  LABPROT  --  14.2  --   --   --   INR  --  1.1  --   --   --   HEPARINUNFRC 0.58  --   --  0.43 0.54  CREATININE 3.86*  --  2.53*  --  2.27*    Estimated Creatinine Clearance: 36 mL/min (A) (by C-G formula based on SCr of 2.27 mg/dL (H)).   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   moderate atypia - right low back   Atypical nevus 04/04/2007   moderate to marked - right upper back (wider shave)   Atypical nevus 04/04/2007   moderate atypia - center chest (wider shave)   Atypical nevus 04/04/2007   slight atypia - right thigh   Atypical nevus 11/29/2011   mild atypia - center upper back   Atypical nevus 11/29/2011   mild atypia - center chest   Bacteremia due to Klebsiella pneumoniae 10/09/2017   Bladder cancer (HCC) dx 07/2017   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 denies   Sleep apnea    cpap   Squamous cell carcinoma of skin 05/22/2013   left cheek - CX3 + 5FU   Wears glasses     Assessment:  Patient with prolonged hospitalization including post-op complications resulting in bowel perforation with open abdomen. Has been closed and transferred to [facility]. On Eliquis PTA for hx of PE. Has been on heparin  gtt since 6/6. Nephro started ESA + IV iron  x1, 6/21 Fe 54, TSAT 19, Ferr 588  AM heparin  level therapeutic at 0.54, 6/23 HgB 8.7 and PLTs 93. Had some bleeding in ileostomy on 6/20 but no issues noted.   Goal of Therapy:  Heparin  level 0.3-0.7 units/ml Monitor platelets by anticoagulation protocol: Yes   Plan:  Continue heparin  1900u/hr.  CBC tomorrow.  Monitor for s/sx of bleeding.  Heparin  level daily Continue to hold DOAC -  dysphagia 3 diet started, potential for calorie counts for determine need for G-tube.   Powell Blush, PharmD, BCCCP  05/06/24 8:15 AM

## 2024-05-06 NOTE — Consult Note (Signed)
 WOC Nurse wound follow up Wound type: surgical  Measurement: see note dated 6/23 Wound bed: red moist, adipose tissue visible Drainage (amount, consistency, odor)  serosanguinous in cannister Periwound: intact Dressing procedure/placement/frequency: Removed old NPWT dressing  Filled wound with   __3_ piece of black foam  Sealed NPWT dressing at HG  Patient tolerated procedure well  WOC nurse will continue to provide NPWT dressing changed due to the complexity of the dressing change.     WOC Nurse ostomy follow up Noted leaking from ileostomy on lower lateral aspect of appliance, spouse reports leaking last night and that bedside nursing changed the ostomy.   Stoma type/location: RLQ ileal conduit, RUQ ileostomy Stomal assessment/size:  RLQ ileal conduit: oval approximately 1 in x 1 1/4 in RUQ ileostomy 1 1/4 in round, slightly budded, os at 1 o'clock  Peristomal assessment:  Dips in abdominal topography surrounding ileal conduit RUQ ileostomy: surrounding skin intact Treatment options for stomal/peristomal skin:  2 in barrier ring utilized around both stomas  Output  Clear urine Liquid brown stool Ostomy pouching:  1pc. Soft convex urostomy and ileostomy pouch  Education provided:  None- patient and spouse are both present during this visit, patient remains minimally interactive though spouse reports that they were cutting skin barrier to fit prior to getting a pre-cut version.  Familiar with management and application. Enrolled patient in [employer] Discharge program: No   WOC team will continue to provide NPWT dressing changed due to the complexity of the dressing change and ostomy teaching as needed.    Doyal Polite, RN, MSN, [facility] WOC Team

## 2024-05-06 NOTE — TOC Progression Note (Signed)
 Transition of Care [facility]) - Progression Note    Patient Details  Name: Kristopher Thompson MRN: [id number] Date of Birth: Jul 11, 1951  Transition of Care [facility]) CM/SW Contact  Lauraine FORBES Saa, LCSW Phone Number: 05/06/2024, 11:22 AM  Clinical Narrative:     11:22 AM Per progressions, patient is not yet medically ready for discharge to [facility]. TOC will continue to follow.  Expected Discharge Plan: Skilled Nursing Facility Barriers to Discharge: Continued Medical Work up  Expected Discharge Plan and Services In-house Referral: Clinical Social Work Discharge Planning Services: CM Consult Post Acute Care Choice: Skilled Nursing Facility Living arrangements for the past 2 months: Single Family Home                                       Social Determinants of Health (SDOH) Interventions SDOH Screenings   Food Insecurity: No Food Insecurity (04/12/2024)  Housing: Low Risk  (04/12/2024)  Transportation Needs: No Transportation Needs (04/12/2024)  Utilities: Not At Risk (04/12/2024)  Social Connections: Socially Integrated (04/10/2024)  Tobacco Use: Medium Risk (04/10/2024)    Readmission Risk Interventions    04/12/2024    4:58 PM  Readmission Risk Prevention Plan  Transportation Screening Complete  PCP or Specialist Appt within 3-5 Days Complete  HRI or Home Care Consult Complete  Social Work Consult for Recovery Care Planning/Counseling Complete  Palliative Care Screening Not Applicable  Medication Review Oceanographer) Complete

## 2024-05-06 NOTE — Progress Notes (Signed)
 PHARMACY NOTE:  ANTIMICROBIAL RENAL DOSAGE ADJUSTMENT  Current antimicrobial regimen includes a mismatch between antimicrobial dosage and estimated renal function.  As per policy approved by the Pharmacy & Therapeutics and Medical Executive Committees, the antimicrobial dosage will be adjusted accordingly.  Current antimicrobial dosage:  Zosyn  2.25g Q8H   Indication: Intra-abdominal infection   Renal Function:  Estimated Creatinine Clearance: 36 mL/min (A) (by C-G formula based on SCr of 2.27 mg/dL (H)). [x]      On intermittent HD, iHD held to assess for renal recovery, last session 6/23     Antimicrobial dosage has been changed to:  Zosyn  3.375g q8H, UOP almost 2L, Scr improving   Additional comments:   Thank you for allowing pharmacy to be a part of this patient's care.  Powell Blush, PharmD, BCCCP  05/06/2024 12:27 PM

## 2024-05-06 NOTE — Progress Notes (Signed)
 NAME:  Kristopher Thompson, MRN:  [id number], DOB:  1951/06/22, LOS: 26 ADMISSION DATE:  04/10/2024, CONSULTATION DATE:  04/13/2024 REFERRING MD:  Dr Celinda, CHIEF COMPLAINT: Sepsis  History of Present Illness:  S/p repair of incisional/parastomal and ventral hernias, left inguinal incarcerated , abdominal hernias, lysis of adhesions, urostomy revision  Transferred to the ICU secondary to tachycardia, tachypnea  History of bladder cancer, prostate cancer, diastolic heart failure, diabetes, right bundle branch block, multiple abdominal surgeries  Update 04/20/24: ccm reconsulted post operatively after pt had perforated bowel and returned to OR. Pt was requiring neo and norepi. He remains intubated at this time. Profoundly acidotic with bicarb of 12.  Started on sodium bicarb infusion and bolus prior to initiation. Follow labs in am. Updated wife and son at bedside.   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   moderate atypia - right low back   Atypical nevus 04/04/2007   moderate to marked - right upper back (wider shave)   Atypical nevus 04/04/2007   moderate atypia - center chest (wider shave)   Atypical nevus 04/04/2007   slight atypia - right thigh   Atypical nevus 11/29/2011   mild atypia - center upper back   Atypical nevus 11/29/2011   mild atypia - center chest   Bacteremia due to Klebsiella pneumoniae 10/09/2017   Bladder cancer (HCC) dx 07/2017   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  s/p  cystectomy w/ ileal conduit urinary diversion   Candida infection    CHF (congestive heart failure) (HCC)    Colostomy in place (HCC)    since 08-08-2017-- per pt 12-25-2017 reddness around stoma   Diabetes mellitus without complication (HCC)    GERD (gastroesophageal reflux disease)    H/O hiatal hernia    History of sepsis 09/2017   dx bacteremia due  to klebsiella pneumoniae,  post op intraabdominal abscess   Prostate cancer [facility]) urologist-- dr renda   10-02-2012 s/p  prostatectomy-- Stage T1c   RBBB    Renal disorder    pt. denies   Sleep apnea    cpap   Squamous cell carcinoma of skin 05/22/2013   left cheek - CX3 + 5FU   Wears glasses    Significant Hospital Events: Including procedures, antibiotic start and stop dates in addition to other pertinent events   04/10/2024-laparoscopic surgery 04/13/24 CCM consult. Incr NE. Art line  6/3 weaning NE. Gently diuresed w 3L off  6/4 off NE. Pulled out his NGT refused replacement. After multiple emesis  6/5 cont NGT 6/6 tx to med surg 6/9: taken back to OR for perforated bowel, post-op shock 6/10: Norepinephrine , Phenylephrine , and vasopressin , added steroids, 1u PRBCs, back to OR for washout and closure 6/11: Worsening AKI with fluid overload, start CRRT, decreasing pressor requirements 6/13 tolerating CRRT 6/14: Off CRRT, arousable.  Tolerated weaning for most of the day 6/13 6/14: Extubated, CRRT stopped. 6/15 transferred to [facility] for iHD; montoring for renal recovery 6/16 iHD, severely encephalopathic with high BUN 6/17 restarted CRRT for to ongoing uremia and encephalopathy 6/18 was awake enough to work with PT, OT 6/23 neph tubes placed 6/24 MS imrpoving, Neph tubes in and draining Interim History / Subjective:  UOP good Cr coming down, even more alert and interactive  Objective    Blood pressure 122/64, pulse  98, temperature 98.4 F (36.9 C), temperature source Oral, resp. rate 16, height 5' 11 (1.803 m), weight 100.4 kg, SpO2 95%.        Intake/Output Summary (Last 24 hours) at 05/06/2024 0957 Last data filed at 05/06/2024 0915 Gross per 24 hour  Intake 2017.63 ml  Output 3235 ml  Net -1217.37 ml   Filed Weights   05/04/24 0500 05/04/24 2200 05/06/24 0500  Weight: 100.2 kg 44.9 kg 100.4 kg    Examination: General:chronically ill, obese, resting  comfortably HEENT: cortrak in place, dry mucus membranes Neuro: awakens to voice, alert and oriented and speaks  Chest: non labored, diminished bilaterally, no wheeze Heart: Regular rate and rhythm, no murmurs or gallops Abdomen: wound vac in place with abdominal drains - JP drains with clear yellow output  Labs and images reviewed   Resolved problem list  Septic shock Acute respiratory failure with hypoxia  Assessment and Plan  Robotic hernia repair-- incisional, parastomal, left inguinal Septic shock due to acute peritonitis caused by bowel perforation - OR 6/9, back to the OR 6/10 for closure and ileal resection and end ileostomy creation General Surgery is Primary  Continue prolonged course of antibiotic therapy discussed between general surgery and ID Continue tube feeds per Surgery Encourage oral intake so we can stop tube feeds Wound VAC management per general surgery  Acute metabolic encephalopathy due to renal failure, uremia, ICU delirium Mental status has improved Avoid sedation  Acute kidney injury due to ischemic ATN from shock as well as what appears urinary obstruction/bilateral hydronephrosis Acute metabolic acidosis due to renal failure hyperphosphatemia Nephrology, urology both following, iHD 6/23 complicated by tachycardia and AMS transient likely vasovagel - suspect hypovolemia overall Monitoring UOP and labs S/p b/l perc neph tubes 6/23  Diabetes type 2 Blood sugars are controlled Continue insulin  with CBG goal 140-180  Acute blood loss anemia Right flank hematoma History of PE, was on Eliquis at home Hemoglobin stable Keep on heparin  for now given potential anticipated procedures.   Chronic HFpEF Looks euvolemic to hypovolemic, GDMT as tolerated  Moderate protein calorie malnutrition Obesity Continue dietary supplements Diet Counseling provided Continue full liquid diet  Deconditioning PT/OT evaluation  Best Practice (right click and  Reselect all SmartList Selections daily)   Diet/type: tubefeeds dysphagia diet as tolerated DVT prophylaxis systemic heparin   Pressure ulcer(s): per nursing assessment GI prophylaxis: N/A Lines: Central line,   Foley:  N/A  Code Status:  full code Last date of multidisciplinary goals of care discussion: 6/25 spouse updated  at bedside   Donnice JONELLE Beals, MD Pulmonary and Critical Care Medicine [facility] 05/06/2024 9:57 AM Pager: see AMION  If no response to pager, please call critical care on call (see AMION) until 7pm After 7:00 pm call Elink

## 2024-05-07 DIAGNOSIS — K631 Perforation of intestine (nontraumatic): Secondary | ICD-10-CM

## 2024-05-07 LAB — MAGNESIUM: Magnesium: 1.9 mg/dL (ref 1.7–2.4)

## 2024-05-07 LAB — CBC
HCT: 28.2 % — ABNORMAL LOW (ref 39.0–52.0)
Hemoglobin: 9.1 g/dL — ABNORMAL LOW (ref 13.0–17.0)
MCH: 29.4 pg (ref 26.0–34.0)
MCHC: 32.3 g/dL (ref 30.0–36.0)
MCV: 91.3 fL (ref 80.0–100.0)
Platelets: 92 10*3/uL — ABNORMAL LOW (ref 150–400)
RBC: 3.09 MIL/uL — ABNORMAL LOW (ref 4.22–5.81)
RDW: 18.3 % — ABNORMAL HIGH (ref 11.5–15.5)
WBC: 10.8 10*3/uL — ABNORMAL HIGH (ref 4.0–10.5)
nRBC: 0 % (ref 0.0–0.2)

## 2024-05-07 LAB — RENAL FUNCTION PANEL
Albumin: 2.6 g/dL — ABNORMAL LOW (ref 3.5–5.0)
Anion gap: 11 (ref 5–15)
BUN: 74 mg/dL — ABNORMAL HIGH (ref 8–23)
CO2: 21 mmol/L — ABNORMAL LOW (ref 22–32)
Calcium: 9 mg/dL (ref 8.9–10.3)
Chloride: 103 mmol/L (ref 98–111)
Creatinine, Ser: 1.94 mg/dL — ABNORMAL HIGH (ref 0.61–1.24)
GFR, Estimated: 36 mL/min — ABNORMAL LOW (ref >60)
Glucose, Bld: 143 mg/dL — ABNORMAL HIGH (ref 70–99)
Phosphorus: 3.4 mg/dL (ref 2.5–4.6)
Potassium: 4.2 mmol/L (ref 3.5–5.1)
Sodium: 135 mmol/L (ref 135–145)

## 2024-05-07 LAB — GLUCOSE, CAPILLARY
Glucose-Capillary: 151 mg/dL — ABNORMAL HIGH (ref 70–99)
Glucose-Capillary: 106 mg/dL — ABNORMAL HIGH (ref 70–99)
Glucose-Capillary: 129 mg/dL — ABNORMAL HIGH (ref 70–99)
Glucose-Capillary: 99 mg/dL (ref 70–99)
Glucose-Capillary: 153 mg/dL — ABNORMAL HIGH (ref 70–99)
Glucose-Capillary: 129 mg/dL — ABNORMAL HIGH (ref 70–99)

## 2024-05-07 LAB — HEPARIN LEVEL (UNFRACTIONATED): Heparin Unfractionated: 0.53 [IU]/mL (ref 0.30–0.70)

## 2024-05-07 MED ORDER — ACETAMINOPHEN 325 MG PO TABS
325.0000 mg | ORAL_TABLET | Freq: Four times a day (QID) | ORAL | Status: DC | PRN
  Administered 2024-05-11 – 2024-05-16 (×5): 650 mg via ORAL
  Filled 2024-05-07 (×8): qty 2

## 2024-05-07 MED ORDER — LOPERAMIDE HCL 1 MG/7.5ML PO SUSP
2.0000 mg | Freq: Every day | ORAL | Status: DC
  Administered 2024-05-07 – 2024-05-10 (×4): 2 mg via ORAL
  Filled 2024-05-07 (×4): qty 15

## 2024-05-07 MED ORDER — CALCIUM POLYCARBOPHIL 625 MG PO TABS
625.0000 mg | ORAL_TABLET | Freq: Two times a day (BID) | ORAL | Status: DC
  Administered 2024-05-07 – 2024-05-22 (×29): 625 mg via ORAL
  Filled 2024-05-07 (×30): qty 1

## 2024-05-07 MED ORDER — JUVEN PO PACK
1.0000 | PACK | Freq: Two times a day (BID) | ORAL | Status: DC
  Administered 2024-05-08 – 2024-05-22 (×27): 1 via ORAL
  Filled 2024-05-07 (×27): qty 1

## 2024-05-07 MED ORDER — SEVELAMER CARBONATE 800 MG PO TABS
800.0000 mg | ORAL_TABLET | Freq: Three times a day (TID) | ORAL | Status: DC
  Administered 2024-05-08: 800 mg via ORAL
  Filled 2024-05-07 (×2): qty 1

## 2024-05-07 MED ORDER — DIPHENHYDRAMINE HCL 12.5 MG/5ML PO ELIX
25.0000 mg | ORAL_SOLUTION | Freq: Three times a day (TID) | ORAL | Status: DC | PRN
  Administered 2024-05-09: 25 mg via ORAL
  Filled 2024-05-07: qty 10

## 2024-05-07 MED ORDER — OXYCODONE HCL 5 MG PO TABS
5.0000 mg | ORAL_TABLET | ORAL | Status: DC | PRN
  Administered 2024-05-12 (×2): 5 mg via ORAL
  Administered 2024-05-13 (×2): 10 mg via ORAL
  Administered 2024-05-14 – 2024-05-16 (×5): 5 mg via ORAL
  Administered 2024-05-17 (×3): 10 mg via ORAL
  Administered 2024-05-18: 5 mg via ORAL
  Administered 2024-05-18: 10 mg via ORAL
  Administered 2024-05-18 – 2024-05-20 (×4): 5 mg via ORAL
  Administered 2024-05-20 – 2024-05-22 (×5): 10 mg via ORAL
  Filled 2024-05-07 (×2): qty 1
  Filled 2024-05-07: qty 2
  Filled 2024-05-07: qty 1
  Filled 2024-05-07 (×2): qty 2
  Filled 2024-05-07: qty 1
  Filled 2024-05-07: qty 2
  Filled 2024-05-07 (×3): qty 1
  Filled 2024-05-07: qty 2
  Filled 2024-05-07: qty 1
  Filled 2024-05-07: qty 2
  Filled 2024-05-07: qty 1
  Filled 2024-05-07: qty 2
  Filled 2024-05-07 (×3): qty 1
  Filled 2024-05-07 (×2): qty 2
  Filled 2024-05-07: qty 1
  Filled 2024-05-07 (×3): qty 2

## 2024-05-07 NOTE — Hospital Course (Addendum)
 Kristopher Thompson is a 73 y.o. male with past medical history significant for HTN, chronic diastolic congestive heart failure, CAD, DM2, pulmonary embolism on Eliquis , anemia, obesity, prostate cancer, bladder cancer who was initially admitted by the general surgery service on 5/30 for operative management regarding parastomal and incisional incarcerated abdominal wall hernias; requiring extensive lysis, mesh insertion, repair, urostomy ileal conduit revision thereafter admitted to the ICU due to tachycardia and tachypnea. Initially placed on norepinephrine  and an arterial line was placed. Patient declined NG tube placement but eventually agreed. Patient was taken back to the OR on 6/9 due to perforated bowel and postop shock. Required multiple pressors, steroids and PRBC transfusion after washout/closure in the OR. On 6/11 developed AKI requiring dialysis.. Patient was extubated on 6/14.    Significant hospital events/procedures: 5/30: Admit to general surgery, robotic LOA, incisional/parastomal, left inguinal incarcerated hernia repair with mesh, urostomy ileal conduit revision, Dr. Sheldon 6/2: CCM consult and vasopressor 6/4: off NE. Pulled out his NGT refused replacement. After multiple emesis  6/5: NG tube replaced. 6/6: transferred to MedSurg. 6/9: OR for perforated bowel, drainage from right abdominal wall abscess, intra-abdominal abscess, small bowel resection, wound VAC; Dr. Tanda 6/10: NE, Phenylephrine , vasopressin , steroids, 1u PRBCs, back to OR for LOA, abdominal wall debridement, ileal resection, end ileostomy abdominal wall hernia repairs with mesh, fascial closure, wound VAC, Dr. Sheldon 6/11: Worsening AKI with fluid overload, start CRRT, decreasing pressor requirements 6/14: Off CRRT, arousable.  Extubated. 6/15: transferred to [facility] for iHD; montoring for renal recovery 6/16: iHD, severely encephalopathic with high BUN 6/17: restarted CRRT for to ongoing uremia and encephalopathy 6/18:  was awake enough to work with PT, OT 6/23: bilateral PCN placed by interventional radiology, Dr. Jennefer 6/24: MS imrpoving, Neph tubes in and draining 6/30: bilateral nephrostomy tube exchange by IR 7/2: Right percutaneous nephrostomy exchange with ureteral stent by IR, cortrak removed 7/7: Oral intake increased, completed micafungin /Zosyn   7/11: He is medically stable for discharge and has been cleared by both general surgery and urology.  PICC line will be removed.   Assessment & Plan:  Principal Problem:   Delayed bowel perforation s/p SBR/end ileostomy Active Problems:   S/P ileal conduit (HCC)   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 08/08/2017   GERD (gastroesophageal reflux disease)   Obesity (BMI 35.0-39.9 without comorbidity)   Prolonged QT interval   Non-recurrent bilateral inguinal hernia without obstruction or gangrene   Incarcerated incisional hernia   Sinus tachycardia   Tachypnea   Acute respiratory insufficiency, postoperative   Sepsis due to undetermined organism (HCC)   Lactic acidosis   Class 2 obesity   Chronic anticoagulation   Hearing loss   History of bladder cancer   Obstructive sleep apnea of adult   Parastomal hernia of ileal conduit   Partial small bowel obstruction (HCC)   Personal history of PE (pulmonary embolism)   Pressure injury of skin   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)   H/O insertion of nephrostomy tube   Incisional/parastomal, left inguinal incarcerated hernia s/p robotic hernia repair Septic shock secondary to acute peritonitis from bowel perforation Patient presenting for surgical repair of incisional/parastomal and left inguinal incarcerated hernia and underwent robotic LOA with mesh placement and urostomy ileal conduit revision by Dr. Sheldon on 04/10/2024.  Postoperatively patient required vasopressors and was transferred to the ICU.  Subsequent return to the OR on  04/20/2024 for bowel perforation underwent I&D of abdominal  wall/intra-abdominal abscess with small bowel resection.  Returned to OR on 6/10 for further abdominal wall debridement, LOA, ileal resection, end ileostomy with abdominal wall hernia repairs with mesh, fascial closure with wound VAC.  Seen by infectious disease, Dr. Dea; with recommendation of 4-6-week course of Zosyn  and micafungin  treatment given mesh placed in a contaminated field with completion of treatment on 05/18/2024. -Surgery following, appreciate assistance -Wound VAC changes twice weekly Monday/Thursday -Further per General Surgery diet was changed to regular diet.  Given that he continues have ileostomy output bowel regimen has been initiated and has been given soluble Polly fiber/FiberCon to thicken and also the general surgery team and added loperamide  to help constipate the patient - Anticipate discharging to SNF now that he is medically stable and cleared from a surgical perspective from both urology and general surgery.   Acute Renal Failure secondary to ischemic ATN/Shock Bilateral Hydronephrosis/Obstruction -Urology following, appreciate assistance -Cr (0.97 04/01/2024). BUN/Cr Trend: Recent Labs  Lab 05/18/24 0207 05/18/24 0600 05/19/24 0600 05/20/24 0354 05/21/24 0650 05/21/24 0651 05/22/24 0630  BUN 57* 73* 62* 69* 85* 84* 91*  CREATININE 1.72* 2.08* 1.74* 1.94* 2.10* 2.14* 2.09*  -Avoid Nephrotoxic Medications, Contrast Dyes, Hypotension and Dehydration to Ensure Adequate Renal Perfusion and will need to Renally Adjust Meds -Continue to Monitor and Trend Renal Function carefully and repeat CMP in the AM  -Required CRRT followed by HD while inpatient, last HD 6/23; nephrology now signed off -S/p bilateral PCN on 6/23, exchanged 6/30; right exchanged 7/2  [name]: Improved.  Supportive Care. C/w Metaclopramide 5 mg TID, Ondansetron  4 mg q6hprn Nausea and Vomiting, and Prochlorperazine  10 mg IV  q6hprn Refractory N/V.  He had a better day today and was tolerating food with mild nausea.  Nausea has essentially resolved   Urine leak w/ Hx prior radical cystectomy/ileal conduit urinary diversion: CT loopogram 7/1 with finding of urine leak identified at inferior butt end of ileal conduit. Right percutaneous nephrostomy exchange with ureteral stent 7/2 by IR.  Further per Urology and Interventional Radiology.  Urology evaluated and they feel that he has remained clinically stable this week and recommending continued bilateral PCN drainage and abdominal drain.  They feel the outputs suggest decreasing drain output which is encouraging and they feel that the fluid in his abdominal drain is likely urine but going to check a creatinine level to confirm and was 2.0 that was consistent w/ Serum.  From urology standpoint they feel cautiously and leave all the drains and nephrostomy tubes in place upon discharge.  They still feel it is is okay for him to be discharged to SNF and they are planning to restudy radiologically as an outpatient  Type 2 Diabetes Mellitus: On metformin 500 mg p.o. daily outpatient.  Hemoglobin A1c 5.3 on 04/01/2024. C/w Semglee  25u [place] at bedtime and  Resistant SSI for coverage. CBGs ranging from 103-145   Acute Blood Loss Anemia Right Flank Hematoma Anemia of Chronic Medical Disease Anemia panel with iron  54, TIBC 281, ferritin 588 -Hgb/Hct Trend:  Recent Labs  Lab 05/14/24 0315 05/15/24 0610 05/16/24 0326 05/18/24 0600 05/20/24 0845 05/21/24 0651 05/22/24 0630  HGB 11.7* 11.7* 12.9* 13.1 13.7 13.8 13.9  HCT 37.1* 36.8* 40.3 39.0 40.1 41.4 40.9  MCV 93.9 92.5 93.1 90.9 89.3 89.2 88.7  -CTM for S/Sx of Bleeding/No overt bleeding noted. Repeat CBC within 1 week   Chronic Diastolic Congestive Heart Failure Essential HTN -C/w Hydralazine  20 mg IV every 4 hours as needed SBP  greater than 160. CTM BP per Protocol. Last BP reading was 100/66   HLD: C/w Rosuvastatin  10 mg  p.o. nightly   History of Pulmonary Embolism: C/w Eliquis  5 mg p.o. twice daily   Weakness/ability/deconditioning:  PT/OT initially recommended CIR but now recommending SNF. Continue therapy efforts while inpatient. Has bed @ [place]  Hypoalbuminemia: Patient's Albumin  Level ranging from 3.0-3.3. (3.3 today) CTM and Trend and repeat CMP in the AM  Moderate Protein Calorie Malnutrition: Nutrition Status:  Nutrition Problem: Increased nutrient needs Etiology: acute illness Signs/Symptoms: estimated needs Interventions: Tube feeding, Prostat, Juven  Class I Obesity: Complicates overall prognosis and care. Estimated body mass index is 30.53 kg/m as calculated from the following:   Height as of this encounter: 5' 11 (1.803 m).   Weight as of this encounter: 99.3 kg. Weight Loss and Dietary Counseling given

## 2024-05-07 NOTE — Progress Notes (Signed)
 PROGRESS NOTE    Kristopher Thompson  FMW:[id number] DOB: 1951/09/01 DOA: 04/10/2024 PCP: [name], MD    Brief Narrative:   73 year old with history of prostate cancer, multiple abdominal surgeries, DM2, chronic anemia, PE on Eliquis at home, CHF with preserved EF, obesity initially presented to the hospital on 5/30 for robotic/laparoscopic surgery for incarcerated abdominal wall hernias requiring extensive lysis, mesh insertion, repair, urostomy ileal conduit revision thereafter admitted to the ICU due to tachycardia and tachypnea.  Initially placed on norepinephrine  and an arterial line was placed.  Patient declined NG tube placement but eventually agreed.  Patient was taken back to the OR on 6/9 due to perforated bowel and postop shock.  Required multiple pressors, steroids and PRBC transfusion after washout/closure in the OR.  On 6/11 developed AKI requiring dialysis..  Patient was extubated on 6/14.  04/10/2024-laparoscopic surgery 04/13/24 CCM consult. Incr NE. Art line  6/3 weaning NE. Gently diuresed w 3L off  6/4 off NE. Pulled out his NGT refused replacement. After multiple emesis  6/5 cont NGT 6/6 tx to med surg 6/9: taken back to OR for perforated bowel, post-op shock 6/10: Norepinephrine , Phenylephrine , and vasopressin , added steroids, 1u PRBCs, back to OR for washout and closure 6/11: Worsening AKI with fluid overload, start CRRT, decreasing pressor requirements 6/13 tolerating CRRT 6/14: Off CRRT, arousable.  Tolerated weaning for most of the day 6/13 6/14: Extubated, CRRT stopped. 6/15 transferred to [facility] for iHD; montoring for renal recovery 6/16 iHD, severely encephalopathic with high BUN 6/17 restarted CRRT for to ongoing uremia and encephalopathy 6/18 was awake enough to work with PT, OT 6/23 neph tubes placed 6/24 MS imrpoving, Neph tubes in and draining  Assessment & Plan:  Principal Problem:   Delayed bowel perforation s/p SBR/end ileostomy Active Problems:    S/P ileal conduit (HCC)   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 08/08/2017   GERD (gastroesophageal reflux disease)   Obesity (BMI 35.0-39.9 without comorbidity)   Prolonged QT interval   Non-recurrent bilateral inguinal hernia without obstruction or gangrene   Incarcerated incisional hernia   Sinus tachycardia   Tachypnea   Acute respiratory insufficiency, postoperative   Sepsis due to undetermined organism (HCC)   Lactic acidosis   Class 2 obesity   Chronic anticoagulation   Hearing loss   History of bladder cancer   Obstructive sleep apnea of adult   Parastomal hernia of ileal conduit   Partial small bowel obstruction (HCC)   Personal history of PE (pulmonary embolism)   Pressure injury of skin   Robotic hernia repair-- incisional, parastomal, left inguinal on 5/30 Septic shock due to acute peritonitis caused by bowel perforation - OR 6/9, back to the OR 6/10 for closure and ileal resection and end ileostomy creation Surgery service remains primary.  Postop management per their team.   Acute metabolic encephalopathy - Multifactorial due to uremia/ICU delirium.  Now this is improved    Acute kidney injury secondary to ischemic ATN/shock Bilateral hydronephrosis/obstruction Seen by nephrology and urology. Need both CRRT and HD. Last HD 6/23 Status post percutaneous nephrostomy tube 6/23 HD catheter removed, Holding off on new one in hopes he wont need any further HD   Diabetes type 2 Accu-Cheks and sliding scale, long-acting.  Adjust as necessary   Acute blood loss anemia Right flank hematoma History of PE, was on Eliquis at home Anemia of Chronic Dz.  Currently patient is on heparin  drip, hemoglobin is stable.  Will transition back to Eliquis when appropriate  ESA   Chronic HFpEF Essential HTN Looks euvolemic to hypovolemic, GDMT as tolerated.  Will resume when appropriate   Moderate protein calorie malnutrition Obesity Diet as tolerated, slowly will turn  off tube feeds   Deconditioning PT/OT- SNF   Currently patient is on heparin  drip, transition to p.o. anticoagulation once cleared by general surgery   Subjective: No complaints doing back.  Requesting to start his outpatient CPAP.   Examination:  General exam: Appears calm and comfortable  Respiratory system: Clear to auscultation. Respiratory effort normal. Cardiovascular system: S1 & S2 heard, RRR. No JVD, murmurs, rubs, gallops or clicks. No pedal edema. Gastrointestinal system: Abdomen is nondistended, soft and nontender. No organomegaly or masses felt. Normal bowel sounds heard. Central nervous system: Alert and oriented. No focal neurological deficits. Extremities: Symmetric 5 x 5 power. Skin: No rashes, lesions or ulcers Psychiatry: Judgement and insight appear normal. Mood & affect appropriate. PICC line in place Bilateral urostomy Ileostomy Multiple drains in place Cortrak in place               Diet Orders (From admission, onward)     Start     Ordered   05/06/24 0806  Diet regular Room service appropriate? Yes; Fluid consistency: Thin  Diet effective now       Comments: If patient experiences nausea, vomiting, worsening pain, or worsening distention; then, make NPO with sips/ice chips only until seen by MD  Question Answer Comment  Room service appropriate? Yes   Fluid consistency: Thin      05/06/24 0805            Objective: Vitals:   05/07/24 0700 05/07/24 0736 05/07/24 0830 05/07/24 1127  BP: 122/72  (!) 144/89   Pulse: 93     Resp: 15     Temp:  98.3 F (36.8 C)  98.1 F (36.7 C)  TempSrc:  Axillary  Axillary  SpO2: 97%     Weight:      Height:        Intake/Output Summary (Last 24 hours) at 05/07/2024 1202 Last data filed at 05/07/2024 0800 Gross per 24 hour  Intake 2779.57 ml  Output 3995 ml  Net -1215.43 ml   Filed Weights   05/04/24 2200 05/06/24 0500 05/07/24 0500  Weight: 44.9 kg 100.4 kg 99.3 kg    Scheduled  Meds:  Chlorhexidine  Gluconate Cloth  6 each Topical Daily   Chlorhexidine  Gluconate Cloth  6 each Topical Q0600   darbepoetin (ARANESP ) injection - DIALYSIS  200 mcg Subcutaneous Q Fri-1800   feeding supplement  237 mL Oral BID BM   feeding supplement (PROSource TF20)  60 mL Per Tube BID   feeding supplement (VITAL 1.5 CAL)  1,440 mL Per Tube Q24H   insulin  aspart  0-20 Units Subcutaneous Q4H   insulin  aspart  6 Units Subcutaneous 3 times per day   insulin  glargine-yfgn  25 Units Subcutaneous QHS   loperamide  HCl  2 mg Per Tube QHS   nutrition supplement (JUVEN)  1 packet Per Tube BID BM   mouth rinse  15 mL Mouth Rinse 4 times per day   polycarbophil  625 mg Per Tube BID   prochlorperazine   10 mg Intravenous Once   sevelamer  carbonate  800 mg Per Tube Q8H   sodium chloride  flush  5 mL Intracatheter Q8H   Continuous Infusions:  sodium chloride  Stopped (05/01/24 0014)   albumin  human     anticoagulant sodium citrate     heparin  1,900  Units/hr (05/07/24 1107)   micafungin  (MYCAMINE ) 100 mg in sodium chloride  0.9 % 100 mL IVPB 100 mg (05/07/24 1027)   piperacillin -tazobactam (ZOSYN )  IV 12.5 mL/hr at 05/07/24 0600    Nutritional status Signs/Symptoms: estimated needs Interventions: Tube feeding, Prostat, Juven Body mass index is 30.53 kg/m.  Data Reviewed:   CBC: Recent Labs  Lab 05/01/24 0417 05/01/24 1313 05/04/24 0341 05/06/24 0500 05/07/24 0526  WBC 13.6* 16.4* 12.4* 11.6* 10.8*  HGB 7.9* 8.2* 9.1* 8.7* 9.1*  HCT 25.1* 26.3* 28.0* 27.2* 28.2*  MCV 90.6 92.0 90.9 91.9 91.3  PLT 135* 111* 136* 93* 92*   Basic Metabolic Panel: Recent Labs  Lab 05/03/24 0453 05/04/24 0341 05/05/24 0425 05/06/24 0500 05/07/24 0415  NA 132* 133* 134* 132* 135  K 5.4* 5.0 4.1 3.7 4.2  CL 101 99 97* 96* 103  CO2 20* 21* 26 24 21*  GLUCOSE 170* 166* 184* 154* 143*  BUN 96* 109* 72* 78* 74*  CREATININE 3.60* 3.86* 2.53* 2.27* 1.94*  CALCIUM  8.4* 9.2 8.9 8.6* 9.0  MG 2.1 2.0  2.0 1.8 1.9  PHOS 6.5* 7.2* 5.2* 3.9 3.4   GFR: Estimated Creatinine Clearance: 41.9 mL/min (A) (by C-G formula based on SCr of 1.94 mg/dL (H)). Liver Function Tests: Recent Labs  Lab 05/03/24 0453 05/04/24 0341 05/05/24 0425 05/06/24 0500 05/07/24 0415  ALBUMIN  2.3* 2.4* 2.5* 2.5* 2.6*   No results for input(s): LIPASE, AMYLASE in the last 168 hours. No results for input(s): AMMONIA in the last 168 hours. Coagulation Profile: Recent Labs  Lab 05/04/24 1020  INR 1.1   Cardiac Enzymes: No results for input(s): CKTOTAL, CKMB, CKMBINDEX, TROPONINI in the last 168 hours. BNP (last 3 results) No results for input(s): PROBNP in the last 8760 hours. HbA1C: No results for input(s): HGBA1C in the last 72 hours. CBG: Recent Labs  Lab 05/06/24 1955 05/06/24 2354 05/07/24 0407 05/07/24 0738 05/07/24 1128  GLUCAP 159* 149* 151* 106* 129*   Lipid Profile: No results for input(s): CHOL, HDL, LDLCALC, TRIG, CHOLHDL, LDLDIRECT in the last 72 hours. Thyroid  Function Tests: No results for input(s): TSH, T4TOTAL, FREET4, T3FREE, THYROIDAB in the last 72 hours. Anemia Panel: No results for input(s): VITAMINB12, FOLATE, FERRITIN, TIBC, IRON , RETICCTPCT in the last 72 hours. Sepsis Labs: No results for input(s): PROCALCITON, LATICACIDVEN in the last 168 hours.  No results found for this or any previous visit (from the past 240 hours).       Radiology Studies: No results found.         LOS: 27 days   Time spent= 35 mins    Burgess JAYSON Dare, MD [facility]  If 7PM-7AM, please contact night-coverage  05/07/2024, 12:02 PM

## 2024-05-07 NOTE — Progress Notes (Signed)
 05/07/2024  ZAIN LANKFORD [id number] August 05, 1951  CARE TEAM: PCP: [name], MD  Outpatient Care Team: Patient Care Team: [name], MD as PCP - General (Internal Medicine) [name], MD (Inactive) as Consulting Physician (Dermatology) [name], MD as Referring Physician (Pulmonary Disease) [name], MD as Consulting Physician (General Surgery) [name], MD as Consulting Physician (Urology)  Inpatient Treatment Team: Treatment Team:  [name], MD [name], MD [name], MD Pccm, Md, MD Dolan Mateo Larger, MD Massie Delaine SAUNDERS, RN Norine Manuelita DELENA, MD Dea Shiner, MD Ruthellen [facility], MD Gerrit Selinda SQUIBB, RN Pennix, Quintin PARAS, NT Bobbette Balzarine, MD Prater, Lenoard NOVAK, PT Whitfield Toribio BROCKS, RN   Problem List:   Principal Problem:   Delayed bowel perforation s/p SBR/end ileostomy Active Problems:   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 08/08/2017   S/P ileal conduit (HCC)   GERD (gastroesophageal reflux disease)   Obesity (BMI 35.0-39.9 without comorbidity)   Prolonged QT interval   Non-recurrent bilateral inguinal hernia without obstruction or gangrene   Incarcerated incisional hernia   Sinus tachycardia   Tachypnea   Acute respiratory insufficiency, postoperative   Sepsis due to undetermined organism (HCC)   Lactic acidosis   Class 2 obesity   Chronic anticoagulation   Hearing loss   History of bladder cancer   Obstructive sleep apnea of adult   Parastomal hernia of ileal conduit   Partial small bowel obstruction (HCC)   Personal history of PE (pulmonary embolism)   Pressure injury of skin   04/10/2024  POST-OPERATIVE DIAGNOSIS:  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   PROCEDURE:   -ROBOTIC LYSIS OF ADHESIONS X 4 HOURS -COMPONENT SEPARATION - TRANSVERSUS ABDOMINIS REALEASE (TAR) BILATERAL -ROBOTIC REPAIRS OF  INCISIONAL, PARASTOMAL,  LEFT INGUINAL  INCARCERATED ABDOMINAL WALL HERNIAS WITH MESH -UROSTOMY ILEAL CONDUIT REVISION -INTRAOPERATIVE ASSESSMENT OF TISSUE VASCULAR PERFUSION USING ICG (indocyanine green ) -IMMUNOFLUORESCENCE -TRANSVERSUS ABDOMINIS PLANE (TAP) BLOCK - BILATERAL    SURGEON:  [name], MD   04/20/2024 POST-OPERATIVE DIAGNOSIS:  perforated bowel   PROCEDURE:  Drainage of right abdominal wall abscess as well as intra-abdominal abscess Explantation of abdominal mesh Small bowel resection (in discontinuity) Placement of ABThera wound VAC   SURGEON:  [name], MD  04/21/2024  POST-OPERATIVE DIAGNOSIS:  DELAYED BOWEL PERFORATION WITH OPEN ABDOMEN   PROCEDURE:   LYSIS OF ADHESIONS X ABDOMINAL WALL DEBRIDEMENT ILEAL RESECTION END ILEOSTOMY ABDOMINAL WALL PARASTOMAL & INCISIONAL HERNIA REPAIRS WITH PHASIX MESH FASCIA CLOSURE WITH WOUND VAC PLACEMENT IN SQ   SURGEON:  [name], MD    05/04/2024 Interventional Radiology Procedure Note   Procedure: Bilateral percutaneous nephrostomy tube placements   Findings: Please refer to procedural dictation for full description. 10 Fr bilateral to bag drainage.   Complications: None immediate   Estimated Blood Loss: < 5 ml   Recommendations: Keep to bag drainage.  IR will follow.  Ultimate management as per Urology.   Assessment  Incarcerated parastomal incisional hernias with obstructive symptoms status post robotic takedown and repair Delayed small bowel perforation s/p ileal resection and end ileostomy Delayed urine leak of urostomy ileal conduit status post perc nephrostomy tubes Multisystem organ failure gradually resolving  SLOWLY IMPROVING   Plan:  Delayed urine leak with AKI:  Urinary diversion with percutaneous nephrostomy tubes.  Still uremic but nonoliguric.  Nephrology following closely.  Trying to hold off on any dialysis this time with low threshold to proceed intermittently as needed.  Per Dr.  Renda urology plan probable IR antegrade GU evaluation to help locate and perhaps stent or better control delayed urine leak since urostomy appears to be viable  Continue right lower quadrant abdominal wall and intraperitoneal drains for now.  If intraperitoneal remains low after next weeks intervention, output most likely can remove intraperitoneal drain and regroup.  Low threshold to repeat CAT scan of abdomen pelvis prior to that on Sunday/Monday.  Nutrition:  Regular diet as tolerated.  Speech therapy concurs  Calorie counts and see if we can gradually wean the tube feeds off over the next week if he eats better.  We will see.  Cycling tube feeds.  Try and do 12 hours.  Bowel regimen for ileostomy.  soluble polyfiber/FiberCon to thicken with nightly loperamide  seems to be working   [employer] 6/16   Hold off on G-tube unless poor PO effort >14days  Infection due to delayed bowel perforation and abdominal wall infection/necrosis  Growing multiple organisms including Candida albicans, Enterobacter, and Enterococcus.    IV Zosyn /micafungin .  Anticipate prolonged antibiotics given the abdominal wall infection/necrosis and need for Phasix mesh.   Change wound vac q MonWedFri the hopes for the subcutaneous tissues to recover.  Last change 6/25 showing early granulation without necrosis guardedly reassuring.      Metabolic encephalopathy slowly resolving.  Follow.  ABLA on top of anemia of chronic disease improved with transfusion.  Follow.    -monitor electrolytes & replace as needed.  Keep K>4, Mg>2, Phos>3.    -Diabetes.  Sliding scale insulin .    -VTE prophylaxis-  Full anticoagulation heparin  drip given history of pulmonary embolism,.  Could consider back on DOAC enteral route once off CRRTx  -Mobilize as tolerated.  Agree with critical care about having physical and Occupational Therapy get more involved now that he is more alert.  Hopefully can rebuild strength and  minimize further atrophy.  Tentatively saying FL 2 discussion about rehab.  I do not want this patient going to an LTAC.  He is not ready.  I updated the patient's status to the the patient & ICU RN in room.  Discussed with Dr. Annella with critical care.  Recommendations were made.  Questions were answered.  They expressed understanding & appreciation.  -Disposition: Felt stable by pulmonary critical care to no longer need ICU/stepdown.  They desire transition to progressive floor.  See if [address]. progressive available since they handle general surgery patient's.  Need to have internal medicine help follow as well given his complexity.  Continue nephrology.  Pulmonary and cardiology as needed.      I reviewed nursing notes, last 24 h vitals and pain scores, last 48 h intake and output, last 24 h labs and trends, and last 24 h imaging results.  I have reviewed this patient's available data, including medical history, events of note, test results, etc as part of my evaluation.   A significant portion of that time was spent in counseling. Care during the described time interval was provided by me.  This care required high  level of medical decision making.  05/07/2024    Subjective: (Chief complaint)  Not sleeping well at night but feeling better.  Wife at bedside.  Trying to eat some.  Felt by critical care okay to be transferred out of the unit.  Objective:  Vital signs:  Vitals:   05/07/24 0400 05/07/24 0500 05/07/24 0600 05/07/24 0700  BP: 119/73 117/72 129/71 122/72  Pulse: (!) 105 99 (!)  105 93  Resp: 20 17 19 15   Temp:      TempSrc:      SpO2: 99% 97% 97% 97%  Weight:  99.3 kg    Height:        Last BM Date : 05/05/24  Intake/Output   Yesterday:  06/25 0701 - 06/26 0700 In: 3599.7 [P.O.:120; I.V.:432.7; NG/GT:2834.3; IV Piggyback:212.7] Out: 3945 [Urine:2450; Drains:290; Stool:1205] This shift:  No intake/output data recorded.  Bowel function:  Flatus:  YES  BM:  YES -small  Drains:  RLQ drain (rests between abdominal wall and Phasix mesh): Serous    LLQ drain (runs over bowel with tip down in RLQ pelvis near base of ileal conduit and site of bowel resection): Serous   Nephrostomy tubes in place.  Right light tea colored.  & Left light yellow-colored  Physical Exam:  General: Resting in no acute distress.   Eyes: PERRL, normal EOM.  Sclera clear.  No icterus Neuro: CN II-XII intact w/o focal sensory/motor deficits. Lymph: No head/neck/groin lymphadenopathy Psych:  No delerium/psychosis/paranoia.  No agitation HENT: Normocephalic, Mucus membranes moist.  No thrush.  ETT & NGT in place Neck: Supple, No tracheal deviation.  No obvious thyromegaly Chest: No pain to chest wall compression.  Good respiratory excursion.   CV:  Pulses intact.  Regular rhythm.  1-2+ BUE/BLE edema MS:  No obvious deformity  Abdomen:  Obese Soft.  Nondistended.  Nontender Wound vac (in SQ over closed fascia) in midline.    RUQ: (End ileostomy): Green oatmeal thick effluent in bag RLQ:  (Urostomy ileal conduit): pink mucosa without any obvious separation.  Scant clear light yellow-colored urine  Ext:   No deformity.  Bilateral hand 0-1+ edema proved.  No lower extremity edema.  No cyanosis Skin: No petechiae / purpurea.  No major sores.  Warm and dry    Results:   Cultures: Recent Results (from the past 720 hours)  Culture, blood (Routine X 2) w Reflex to ID Panel     Status: None   Collection Time: 04/13/24  7:13 AM   Specimen: BLOOD  Result Value Ref Range Status   Specimen Description   Final    BLOOD BLOOD LEFT ARM AEROBIC BOTTLE ONLY ANAEROBIC BOTTLE ONLY Performed at [facility], [address] [place], [place] [address]    Special Requests   Final    BOTTLES DRAWN AEROBIC AND ANAEROBIC Blood Culture results may not be optimal due to an inadequate volume of blood received in culture bottles Performed at [facility], [address] [place], [place] [address]    Culture   Final    NO GROWTH 5 DAYS Performed at [facility], [address], [place], [place] [address]    Report Status 04/18/2024 FINAL  Final  Culture, blood (Routine X 2) w Reflex to ID Panel     Status: None   Collection Time: 04/13/24  7:20 AM   Specimen: BLOOD  Result Value Ref Range Status   Specimen Description   Final    BLOOD BLOOD RIGHT ARM AEROBIC BOTTLE ONLY ANAEROBIC BOTTLE ONLY Performed at [facility], [address], [place], [place] [address]    Special Requests   Final    BOTTLES DRAWN AEROBIC AND ANAEROBIC Blood Culture results may not be optimal due to an inadequate volume of blood received in culture bottles Performed at [facility], [address] [place], [place] [address]  Culture   Final    NO GROWTH 5 DAYS Performed at [facility], [address] [place], [place] [address]    Report Status 04/18/2024 FINAL  Final  Urine Culture     Status: None   Collection Time: 04/13/24 10:18 AM   Specimen: Urine, Clean Catch  Result Value Ref Range Status   Specimen Description   Final    URINE, CLEAN CATCH Performed at [facility], [address] [place], [place] [address]    Special Requests   Final    NONE Performed at [facility], [address], [place], [place] [address]    Culture   Final    NO GROWTH Performed at [facility], [address] [place], [place] [address]    Report Status 04/14/2024 FINAL  Final  MRSA Next Gen by PCR, Nasal     Status: None   Collection Time: 04/13/24 11:35 AM   Specimen: Nasal Mucosa; Nasal Swab  Result Value Ref Range Status   MRSA by PCR Next Gen NOT DETECTED NOT DETECTED Final    Comment: (NOTE) The GeneXpert MRSA Assay (FDA approved for NASAL specimens only), is one component of a comprehensive MRSA colonization surveillance program. It is  not intended to diagnose MRSA infection nor to guide or monitor treatment for MRSA infections. Test performance is not FDA approved in patients less than 79 years old. Performed at [facility], [address] [place], [place] [address]   Culture, blood (Routine X 2) w Reflex to ID Panel     Status: None   Collection Time: 04/20/24  8:46 AM   Specimen: BLOOD RIGHT ARM  Result Value Ref Range Status   Specimen Description   Final    BLOOD RIGHT ARM Performed at [facility], [address] [place], [place] [address]    Special Requests   Final    BOTTLES DRAWN AEROBIC AND ANAEROBIC Blood Culture results may not be optimal due to an inadequate volume of blood received in culture bottles Performed at [facility], [address], [place], [place] [address]    Culture   Final    NO GROWTH 5 DAYS Performed at [facility], [address] [place], [place] [address]    Report Status 04/25/2024 FINAL  Final  Culture, blood (Routine X 2) w Reflex to ID Panel     Status: None   Collection Time: 04/20/24  8:49 AM   Specimen: BLOOD  Result Value Ref Range Status   Specimen Description   Final    BLOOD Performed at [facility], [address], [place], [place] [address]    Special Requests   Final    BOTTLES DRAWN AEROBIC AND ANAEROBIC Blood Culture results may not be optimal due to an inadequate volume of blood received in culture bottles Performed at [facility], [address] [place], [place] [address]    Culture   Final    NO GROWTH 5 DAYS Performed at [facility], [address], [place], [place] [address]    Report Status 04/25/2024 FINAL  Final  Culture, Respiratory w Gram Stain     Status: None   Collection Time: 04/20/24 12:03 PM   Specimen: Tracheal Aspirate; Respiratory  Result Value Ref Range Status   Specimen Description   Final    TRACHEAL ASPIRATE Performed at [facility], [address] [place], [place] [address]    Special Requests   Final    NONE Performed at [facility], [address] [place], [place] [address]    Gram Stain NO WBC SEEN RARE GRAM POSITIVE COCCI   Final   Culture   Final    RARE Normal respiratory flora-no Staph aureus or Pseudomonas seen Performed at [facility], [address], [place], [place] [address]    Report Status 04/23/2024 FINAL  Final  Aerobic/Anaerobic Culture w Gram Stain (surgical/deep wound)     Status: None   Collection Time: 04/21/24  2:23 PM   Specimen: Soft Tissue, Other  Result Value Ref Range Status   Specimen Description   Final    TISSUE INTRA ABDOMINAL NECROTIC TISSUE Performed at [facility], [address], [place], [place] [address]    Special Requests   Final    NONE Performed at [facility], [address], [place], [place] [address]    Gram Stain   Final    FEW WBC PRESENT,BOTH PMN AND MONONUCLEAR RARE GRAM POSITIVE COCCI IN PAIRS AND CHAINS RARE YEAST    Culture   Final    RARE ESCHERICHIA COLI FEW CANDIDA ALBICANS FEW ENTEROCOCCUS FAECALIS FEW BACTEROIDES SPECIES BETA LACTAMASE POSITIVE Performed at [facility], [address] [place], [place] [address]    Report Status 04/25/2024 FINAL  Final   Organism ID, Bacteria ESCHERICHIA COLI  Final   Organism ID, Bacteria ENTEROCOCCUS FAECALIS  Final      Susceptibility   Escherichia coli - MIC*    AMPICILLIN >=32 RESISTANT Resistant     CEFEPIME  <=0.12 SENSITIVE Sensitive     CEFTAZIDIME <=1 SENSITIVE Sensitive     CEFTRIAXONE  1 SENSITIVE Sensitive     CIPROFLOXACIN  <=0.25 SENSITIVE Sensitive     GENTAMICIN  <=1 SENSITIVE Sensitive     IMIPENEM <=0.25 SENSITIVE Sensitive     TRIMETH /SULFA  <=20 SENSITIVE Sensitive     AMPICILLIN/SULBACTAM >=32 RESISTANT Resistant     PIP/TAZO 8 SENSITIVE Sensitive ug/mL    * RARE ESCHERICHIA COLI   Enterococcus  faecalis - MIC*    AMPICILLIN <=2 SENSITIVE Sensitive     VANCOMYCIN  1 SENSITIVE Sensitive     GENTAMICIN  SYNERGY SENSITIVE Sensitive     * FEW ENTEROCOCCUS FAECALIS    Labs: Results for orders placed or performed during the hospital encounter of 04/10/24 (from the past 48 hours)  Glucose, capillary     Status: Abnormal   Collection Time: 05/05/24 11:12 AM  Result Value Ref Range   Glucose-Capillary 111 (H) 70 - 99 mg/dL    Comment: Glucose reference range applies only to samples taken after fasting for at least 8 hours.  Glucose, capillary     Status: Abnormal   Collection Time: 05/05/24  3:08 PM  Result Value Ref Range   Glucose-Capillary 131 (H) 70 - 99 mg/dL    Comment: Glucose reference range applies only to samples taken after fasting for at least 8 hours.  Glucose, capillary     Status: Abnormal   Collection Time: 05/05/24  7:29 PM  Result Value Ref Range   Glucose-Capillary 121 (H) 70 - 99 mg/dL    Comment: Glucose reference range applies only to samples taken after fasting for at least 8 hours.  Glucose, capillary     Status: Abnormal   Collection Time: 05/05/24 11:32 PM  Result Value Ref Range   Glucose-Capillary 146 (H) 70 -  99 mg/dL    Comment: Glucose reference range applies only to samples taken after fasting for at least 8 hours.  Glucose, capillary     Status: Abnormal   Collection Time: 05/06/24  3:32 AM  Result Value Ref Range   Glucose-Capillary 184 (H) 70 - 99 mg/dL    Comment: Glucose reference range applies only to samples taken after fasting for at least 8 hours.  Renal function panel (daily at 0500)     Status: Abnormal   Collection Time: 05/06/24  5:00 AM  Result Value Ref Range   Sodium 132 (L) 135 - 145 mmol/L   Potassium 3.7 3.5 - 5.1 mmol/L   Chloride 96 (L) 98 - 111 mmol/L   CO2 24 22 - 32 mmol/L   Glucose, Bld 154 (H) 70 - 99 mg/dL    Comment: Glucose reference range applies only to samples taken after fasting for at least 8 hours.   BUN 78  (H) 8 - 23 mg/dL   Creatinine, Ser 7.72 (H) 0.61 - 1.24 mg/dL   Calcium  8.6 (L) 8.9 - 10.3 mg/dL   Phosphorus 3.9 2.5 - 4.6 mg/dL   Albumin  2.5 (L) 3.5 - 5.0 g/dL   GFR, Estimated 30 (L) >60 mL/min    Comment: (NOTE) Calculated using the CKD-EPI Creatinine Equation (2021)    Anion gap 12 5 - 15    Comment: Performed at [facility], [address], [place], [place] [address]  Magnesium      Status: None   Collection Time: 05/06/24  5:00 AM  Result Value Ref Range   Magnesium  1.8 1.7 - 2.4 mg/dL    Comment: Performed at [facility], [address] [place], [place] [address]  Heparin  level (unfractionated)     Status: None   Collection Time: 05/06/24  5:00 AM  Result Value Ref Range   Heparin  Unfractionated 0.54 0.30 - 0.70 IU/mL    Comment: (NOTE) The clinical reportable range upper limit is being lowered to >1.10 to align with the FDA approved guidance for the current laboratory assay.  If heparin  results are below expected values, and patient dosage has  been confirmed, suggest follow up testing of antithrombin III levels. Performed at [facility], [address], [place], [place] [address]   CBC     Status: Abnormal   Collection Time: 05/06/24  5:00 AM  Result Value Ref Range   WBC 11.6 (H) 4.0 - 10.5 K/uL   RBC 2.96 (L) 4.22 - 5.81 MIL/uL   Hemoglobin 8.7 (L) 13.0 - 17.0 g/dL   HCT 72.7 (L) 60.9 - 47.9 %   MCV 91.9 80.0 - 100.0 fL   MCH 29.4 26.0 - 34.0 pg   MCHC 32.0 30.0 - 36.0 g/dL   RDW 82.2 (H) 88.4 - 84.4 %   Platelets 93 (L) 150 - 400 K/uL    Comment: Immature Platelet Fraction may be clinically indicated, consider ordering this additional test [id number]    nRBC 0.2 0.0 - 0.2 %    Comment: Performed at [facility], [address], [place], [place] [address]  Glucose, capillary     Status: Abnormal   Collection Time: 05/06/24  7:30 AM  Result Value Ref Range   Glucose-Capillary 145 (H) 70 - 99 mg/dL    Comment: Glucose reference  range applies only to samples taken after fasting for at least 8 hours.  Glucose, capillary     Status: Abnormal   Collection  Time: 05/06/24 11:21 AM  Result Value Ref Range   Glucose-Capillary 108 (H) 70 - 99 mg/dL    Comment: Glucose reference range applies only to samples taken after fasting for at least 8 hours.  Glucose, capillary     Status: Abnormal   Collection Time: 05/06/24  3:16 PM  Result Value Ref Range   Glucose-Capillary 148 (H) 70 - 99 mg/dL    Comment: Glucose reference range applies only to samples taken after fasting for at least 8 hours.  Glucose, capillary     Status: Abnormal   Collection Time: 05/06/24  7:55 PM  Result Value Ref Range   Glucose-Capillary 159 (H) 70 - 99 mg/dL    Comment: Glucose reference range applies only to samples taken after fasting for at least 8 hours.  Glucose, capillary     Status: Abnormal   Collection Time: 05/06/24 11:54 PM  Result Value Ref Range   Glucose-Capillary 149 (H) 70 - 99 mg/dL    Comment: Glucose reference range applies only to samples taken after fasting for at least 8 hours.  Glucose, capillary     Status: Abnormal   Collection Time: 05/07/24  4:07 AM  Result Value Ref Range   Glucose-Capillary 151 (H) 70 - 99 mg/dL    Comment: Glucose reference range applies only to samples taken after fasting for at least 8 hours.  Renal function panel (daily at 0500)     Status: Abnormal   Collection Time: 05/07/24  4:15 AM  Result Value Ref Range   Sodium 135 135 - 145 mmol/L   Potassium 4.2 3.5 - 5.1 mmol/L   Chloride 103 98 - 111 mmol/L   CO2 21 (L) 22 - 32 mmol/L   Glucose, Bld 143 (H) 70 - 99 mg/dL    Comment: Glucose reference range applies only to samples taken after fasting for at least 8 hours.   BUN 74 (H) 8 - 23 mg/dL   Creatinine, Ser 8.05 (H) 0.61 - 1.24 mg/dL   Calcium  9.0 8.9 - 10.3 mg/dL   Phosphorus 3.4 2.5 - 4.6 mg/dL   Albumin  2.6 (L) 3.5 - 5.0 g/dL   GFR, Estimated 36 (L) >60 mL/min    Comment:  (NOTE) Calculated using the CKD-EPI Creatinine Equation (2021)    Anion gap 11 5 - 15    Comment: Performed at [facility], [address], [place], [place] [address]  Magnesium      Status: None   Collection Time: 05/07/24  4:15 AM  Result Value Ref Range   Magnesium  1.9 1.7 - 2.4 mg/dL    Comment: Performed at [facility], [address] [place], [place] [address]  Heparin  level (unfractionated)     Status: None   Collection Time: 05/07/24  4:15 AM  Result Value Ref Range   Heparin  Unfractionated 0.53 0.30 - 0.70 IU/mL    Comment: (NOTE) The clinical reportable range upper limit is being lowered to >1.10 to align with the FDA approved guidance for the current laboratory assay.  If heparin  results are below expected values, and patient dosage has  been confirmed, suggest follow up testing of antithrombin III levels. Performed at [facility], [address] [place], [place] [address]   CBC     Status: Abnormal   Collection Time: 05/07/24  5:26 AM  Result Value Ref Range   WBC 10.8 (H) 4.0 - 10.5 K/uL   RBC 3.09 (L) 4.22 - 5.81 MIL/uL   Hemoglobin  9.1 (L) 13.0 - 17.0 g/dL   HCT 71.7 (L) 60.9 - 47.9 %   MCV 91.3 80.0 - 100.0 fL   MCH 29.4 26.0 - 34.0 pg   MCHC 32.3 30.0 - 36.0 g/dL   RDW 81.6 (H) 88.4 - 84.4 %   Platelets 92 (L) 150 - 400 K/uL    Comment: Immature Platelet Fraction may be clinically indicated, consider ordering this additional test [id number] REPEATED TO VERIFY    nRBC 0.0 0.0 - 0.2 %    Comment: Performed at [facility], [address] [place], [place] [address]    Imaging / Studies: No results found.      Medications / Allergies: per chart  Antibiotics: Anti-infectives (From admission, onward)    Start     Dose/Rate Route Frequency Ordered Stop   05/06/24 1400  piperacillin -tazobactam (ZOSYN ) IVPB 3.375 g        3.375 g 12.5 mL/hr over 240 Minutes Intravenous Every 8 hours 05/06/24 1227     05/01/24 1400   piperacillin -tazobactam (ZOSYN ) IVPB 2.25 g  Status:  Discontinued        2.25 g 100 mL/hr over 30 Minutes Intravenous Every 8 hours 05/01/24 0957 05/06/24 1227   05/01/24 1045  micafungin  (MYCAMINE ) 100 mg in sodium chloride  0.9 % 100 mL IVPB        100 mg 105 mL/hr over 1 Hours Intravenous Every 24 hours 05/01/24 0949     04/29/24 1000  micafungin  (MYCAMINE ) 150 mg in sodium chloride  0.9 % 100 mL IVPB  Status:  Discontinued        150 mg 107.5 mL/hr over 1 Hours Intravenous Every 24 hours 04/28/24 1036 05/01/24 0949   04/28/24 1345  micafungin  (MYCAMINE ) 100 mg in sodium chloride  0.9 % 100 mL IVPB        100 mg 105 mL/hr over 1 Hours Intravenous  Once 04/28/24 1245 04/28/24 1354   04/28/24 1000  micafungin  (MYCAMINE ) 100 mg in sodium chloride  0.9 % 100 mL IVPB  Status:  Discontinued        100 mg 105 mL/hr over 1 Hours Intravenous Every 24 hours 04/27/24 1219 04/28/24 1036   04/21/24 1400  clindamycin  (CLEOCIN ) 900 mg, gentamicin  (GARAMYCIN ) 240 mg in sodium chloride  0.9 % 1,000 mL for intraperitoneal lavage  Status:  Discontinued         Irrigation To Surgery 04/21/24 1346 04/21/24 1618   04/20/24 1400  piperacillin -tazobactam (ZOSYN ) IVPB 3.375 g  Status:  Discontinued        3.375 g 12.5 mL/hr over 240 Minutes Intravenous Every 8 hours 04/20/24 0755 05/01/24 0957   04/20/24 1000  metroNIDAZOLE  (FLAGYL ) IVPB 500 mg  Status:  Discontinued        500 mg 100 mL/hr over 60 Minutes Intravenous Every 12 hours 04/20/24 0320 04/20/24 0752   04/20/24 1000  micafungin  (MYCAMINE ) 150 mg in sodium chloride  0.9 % 100 mL IVPB  Status:  Discontinued        150 mg 107.5 mL/hr over 1 Hours Intravenous Every 24 hours 04/20/24 0752 04/27/24 1219   04/20/24 0245  ceFEPIme  (MAXIPIME ) 2 g in sodium chloride  0.9 % 100 mL IVPB  Status:  Discontinued        2 g 200 mL/hr over 30 Minutes Intravenous Every 8 hours 04/20/24 0232 04/20/24 0752   04/20/24 0100  metroNIDAZOLE  (FLAGYL ) IVPB 500 mg        500  mg 100 mL/hr over 60 Minutes Intravenous On  call to O.R. 04/20/24 0004 04/20/24 0100   04/14/24 1800  ceFEPIme  (MAXIPIME ) 2 g in sodium chloride  0.9 % 100 mL IVPB        2 g 200 mL/hr over 30 Minutes Intravenous Every 8 hours 04/14/24 1003 04/19/24 0957   04/14/24 1600  vancomycin  (VANCOCIN ) IVPB 1000 mg/200 mL premix  Status:  Discontinued        1,000 mg 200 mL/hr over 60 Minutes Intravenous Every 24 hours 04/13/24 1420 04/14/24 1000   04/14/24 1600  vancomycin  (VANCOREADY) IVPB 1500 mg/300 mL  Status:  Discontinued        1,500 mg 150 mL/hr over 120 Minutes Intravenous Every 24 hours 04/14/24 1002 04/16/24 0744   04/14/24 1200  vancomycin  (VANCOCIN ) IVPB 1000 mg/200 mL premix  Status:  Discontinued        1,000 mg 200 mL/hr over 60 Minutes Intravenous Every 24 hours 04/13/24 1053 04/13/24 1109   04/13/24 2200  ceFEPIme  (MAXIPIME ) 2 g in sodium chloride  0.9 % 100 mL IVPB  Status:  Discontinued        2 g 200 mL/hr over 30 Minutes Intravenous Every 12 hours 04/13/24 1036 04/13/24 1109   04/13/24 2200  ceFEPIme  (MAXIPIME ) 2 g in sodium chloride  0.9 % 100 mL IVPB  Status:  Discontinued        2 g 200 mL/hr over 30 Minutes Intravenous Every 12 hours 04/13/24 1425 04/14/24 1003   04/13/24 1500  vancomycin  (VANCOCIN ) 2,000 mg in sodium chloride  0.9 % 500 mL IVPB        2,000 mg 260 mL/hr over 120 Minutes Intravenous  Once 04/13/24 1409 04/13/24 1850   04/13/24 1500  ceFEPIme  (MAXIPIME ) 2 g in sodium chloride  0.9 % 100 mL IVPB  Status:  Discontinued        2 g 200 mL/hr over 30 Minutes Intravenous Every 12 hours 04/13/24 1420 04/13/24 1425   04/13/24 1500  metroNIDAZOLE  (FLAGYL ) IVPB 500 mg  Status:  Discontinued        500 mg 100 mL/hr over 60 Minutes Intravenous Every 12 hours 04/13/24 1420 04/17/24 0725   04/13/24 1200  vancomycin  (VANCOCIN ) 2,000 mg in sodium chloride  0.9 % 500 mL IVPB  Status:  Discontinued        2,000 mg 260 mL/hr over 120 Minutes Intravenous  Once 04/13/24 1052  04/13/24 1121   04/13/24 1100  metroNIDAZOLE  (FLAGYL ) IVPB 500 mg  Status:  Discontinued        500 mg 100 mL/hr over 60 Minutes Intravenous 2 times daily 04/13/24 1007 04/13/24 1109   04/13/24 1100  vancomycin  (VANCOCIN ) IVPB 1000 mg/200 mL premix  Status:  Discontinued        1,000 mg 200 mL/hr over 60 Minutes Intravenous  Once 04/13/24 1007 04/13/24 1020   04/13/24 1015  ceFEPIme  (MAXIPIME ) 2 g in sodium chloride  0.9 % 100 mL IVPB        2 g 200 mL/hr over 30 Minutes Intravenous STAT 04/13/24 1007 04/14/24 1721   04/10/24 2200  ceFAZolin  (ANCEF ) IVPB 2g/100 mL premix        2 g 200 mL/hr over 30 Minutes Intravenous Every 8 hours 04/10/24 1833 04/11/24 0531   04/10/24 0600  ceFAZolin  (ANCEF ) IVPB 2g/100 mL premix        2 g 200 mL/hr over 30 Minutes Intravenous On call to O.R. 04/10/24 0533 04/10/24 1526         Note: Portions of this report may have been transcribed using  voice recognition software. Every effort was made to ensure accuracy; however, inadvertent computerized transcription errors may be present.   Any transcriptional errors that result from this process are unintentional.    Elspeth KYM Schultze, MD, FACS, MASCRS Esophageal, Gastrointestinal & Colorectal Surgery Robotic and Minimally Invasive Surgery  [facility] [address] [place], [place] [address] [phone] Fax [phone] Main  CONTACT INFORMATION: Weekday (9AM-5PM): Call [facility] main office at [phone] Weeknight (5PM-9AM) or Weekend/Holiday: Check EPIC Web Links tab & use AMION (password  [email or web address]) for General Surgery [facility] coverage  Please, DO NOT use SecureChat  (it is not reliable communication to reach operating surgeons & will lead to a delay in care).   Epic staff messaging available for outptient concerns needing 1-2 business day response.      05/07/2024  7:22 AM

## 2024-05-07 NOTE — Progress Notes (Signed)
 Patient ID: Kristopher Thompson, male   DOB: [birth year], 73 y.o.   MRN: [id number] S: No new complaints O:BP 122/72 (BP Location: Left Arm)   Pulse 93   Temp 98.3 F (36.8 C) (Axillary)   Resp 15   Ht 5' 11 (1.803 m)   Wt 99.3 kg   SpO2 97%   BMI 30.53 kg/m   Intake/Output Summary (Last 24 hours) at 05/07/2024 0819 Last data filed at 05/07/2024 0600 Gross per 24 hour  Intake 3480.64 ml  Output 3945 ml  Net -464.36 ml   Intake/Output: I/O last 3 completed shifts: In: 5137.7 [P.O.:120; I.V.:670.5; WH/HU:5965.6; IV Piggyback:312.9] Out: 5855 [Urine:2450; Drains:1650; Stool:1755]  Intake/Output this shift:  No intake/output data recorded. Weight change: -1.1 kg Gen: NAD CVS: RRR Resp:CTA Abd: +BS, soft, ostomy in RLQ, wound vac in place Ext: no edema  Recent Labs  Lab 05/01/24 0417 05/02/24 0515 05/03/24 0453 05/04/24 0341 05/05/24 0425 05/06/24 0500 05/07/24 0415  NA 135 135 132* 133* 134* 132* 135  K 4.9 5.3* 5.4* 5.0 4.1 3.7 4.2  CL 105 102 101 99 97* 96* 103  CO2 25 23 20* 21* 26 24 21*  GLUCOSE 159* 123* 170* 166* 184* 154* 143*  BUN 38* 69* 96* 109* 72* 78* 74*  CREATININE 1.58* 2.96* 3.60* 3.86* 2.53* 2.27* 1.94*  ALBUMIN  2.0* 2.2* 2.3* 2.4* 2.5* 2.5* 2.6*  CALCIUM  7.9* 8.6* 8.4* 9.2 8.9 8.6* 9.0  PHOS 2.2* 5.1* 6.5* 7.2* 5.2* 3.9 3.4   Liver Function Tests: Recent Labs  Lab 05/05/24 0425 05/06/24 0500 05/07/24 0415  ALBUMIN  2.5* 2.5* 2.6*   No results for input(s): LIPASE, AMYLASE in the last 168 hours. No results for input(s): AMMONIA in the last 168 hours. CBC: Recent Labs  Lab 05/01/24 0417 05/01/24 1313 05/04/24 0341 05/06/24 0500 05/07/24 0526  WBC 13.6* 16.4* 12.4* 11.6* 10.8*  HGB 7.9* 8.2* 9.1* 8.7* 9.1*  HCT 25.1* 26.3* 28.0* 27.2* 28.2*  MCV 90.6 92.0 90.9 91.9 91.3  PLT 135* 111* 136* 93* 92*   Cardiac Enzymes: No results for input(s): CKTOTAL, CKMB, CKMBINDEX, TROPONINI in the last 168 hours. CBG: Recent Labs   Lab 05/06/24 1516 05/06/24 1955 05/06/24 2354 05/07/24 0407 05/07/24 0738  GLUCAP 148* 159* 149* 151* 106*    Iron  Studies: No results for input(s): IRON , TIBC, TRANSFERRIN, FERRITIN in the last 72 hours. Studies/Results: No results found.  Chlorhexidine  Gluconate Cloth  6 each Topical Daily   Chlorhexidine  Gluconate Cloth  6 each Topical Q0600   darbepoetin (ARANESP ) injection - DIALYSIS  200 mcg Subcutaneous Q Fri-1800   feeding supplement  237 mL Oral BID BM   feeding supplement (PROSource TF20)  60 mL Per Tube BID   feeding supplement (VITAL 1.5 CAL)  1,440 mL Per Tube Q24H   insulin  aspart  0-20 Units Subcutaneous Q4H   insulin  aspart  6 Units Subcutaneous 3 times per day   insulin  glargine-yfgn  25 Units Subcutaneous QHS   loperamide  HCl  2 mg Per Tube QHS   nutrition supplement (JUVEN)  1 packet Per Tube BID BM   mouth rinse  15 mL Mouth Rinse 4 times per day   polycarbophil  625 mg Per Tube BID   prochlorperazine   10 mg Intravenous Once   sevelamer  carbonate  800 mg Per Tube Q8H   sodium chloride  flush  5 mL Intracatheter Q8H    BMET    Component Value Date/Time   NA 135 05/07/2024 0415   K 4.2 05/07/2024  0415   CL 103 05/07/2024 0415   CO2 21 (L) 05/07/2024 0415   GLUCOSE 143 (H) 05/07/2024 0415   BUN 74 (H) 05/07/2024 0415   CREATININE 1.94 (H) 05/07/2024 0415   CALCIUM  9.0 05/07/2024 0415   GFRNONAA 36 (L) 05/07/2024 0415   GFRAA >60 12/09/2018 0549   CBC    Component Value Date/Time   WBC 10.8 (H) 05/07/2024 0526   RBC 3.09 (L) 05/07/2024 0526   HGB 9.1 (L) 05/07/2024 0526   HCT 28.2 (L) 05/07/2024 0526   PLT 92 (L) 05/07/2024 0526   MCV 91.3 05/07/2024 0526   MCH 29.4 05/07/2024 0526   MCHC 32.3 05/07/2024 0526   RDW 18.3 (H) 05/07/2024 0526   LYMPHSABS 0.8 04/21/2024 1715   MONOABS 0.9 04/21/2024 1715   EOSABS 0.0 04/21/2024 1715   BASOSABS 0.1 04/21/2024 1715    Assessment/ Plan: Pt is a 73 y.o. yo male  with  hypertension, DM,  CAD, hx bladder cancer, CHF, right bundle branch block who had acute bowel perforation and status post OR for bowel repair on 6/8, lysis of adhesion, ileal resection end ileostomy hernia repairs, septic shock seen for AKI and oliguria.    # Acute kidney injury, oliguric with anasarca likely ischemic ATN due to septic shock and obstructive uropathy given bilateral hydronephrosis.  CRRT 6/11-6/14 -  Because of the patient's persistent altered mental status and high metabolic state with elevated BUN was put back on CRRT on 6/17- 6/20 -  then taken off- with rising BUN and crt .  Had HD 05/04/24 without UF or complications.  His UOP and BUN/Cr have continued to improve without HD.  Appears to have renal recovery.  Temp cath removed yesterday.  Nothing further to add so will sign off.  Please call with any questions or concerns.  He can follow up with our office 3-4 weeks after discharge if his renal function does not return to normal.      # Bilateral hydronephrosis severe on the left side: S/p revision of ileal conduit 5/30,  urology following. Now no UOP brings to mind possible disruption-  checking surgical drain output for crt -  seemed to be indicative of leak-  have talked to GU-  s/p bilateral percutaneous nephrostomy tube placement by IR on 05/04/24.   # Acute bowel perforation is status post bowel repair ileal conduit and hernia repairs urostomy ileal conduit revision.  As per surgical team.    # HTN: Continue current medications and adjust volume status with dialysis as tolerated   # Acute hypoxic respiratory failure/VDRF per pulmonary team. Extubated 6/14, currently on nasal cannula   # Altered mental status: Likely multifactorial.  Uremia may have played some role.  Improving slowly -  a little more somnolent today - uremia may have something to do with it    # Anemia-  added ESA -  iron  stores OK   # Hyperkalemia-  given lokelma - and follow after HD and PCN placement, now resolved  Fairy RONAL Sellar, MD [facility] [phone]

## 2024-05-07 NOTE — Progress Notes (Signed)
 Patient ID: Kristopher Thompson, male   DOB: Oct 23, 1951, 73 y.o.   MRN: [id number]  16 Days Post-Op Subjective: Continues to do better since nephrostomy tube placement on Monday.  Has not required dialysis since Monday.  Objective: Vital signs in last 24 hours: Temp:  [97.3 F (36.3 C)-99.4 F (37.4 C)] 99.4 F (37.4 C) (06/25 1900) Pulse Rate:  [99-111] 105 (06/26 0600) Resp:  [14-22] 19 (06/26 0600) BP: (107-139)/(64-76) 129/71 (06/26 0600) SpO2:  [95 %-99 %] 97 % (06/26 0600) Weight:  [99.3 kg] 99.3 kg (06/26 0500)  Intake/Output from previous day: 06/25 0701 - 06/26 0700 In: 3599.7 [P.O.:120; I.V.:432.7; NG/GT:2834.3; IV Piggyback:212.7] Out: 3945 [Urine:2450; Drains:290; Stool:1205] Intake/Output this shift: No intake/output data recorded.  Physical Exam:  General: Alert and oriented CV: RRR Abd: B PCNs in place and draining well with clear urine, Urostomy pink with scant urine output  Lab Results: Recent Labs    05/06/24 0500 05/07/24 0526  HGB 8.7* 9.1*  HCT 27.2* 28.2*      Latest Ref Rng & Units 05/07/2024    5:26 AM 05/06/2024    5:00 AM 05/04/2024    3:41 AM  CBC  WBC 4.0 - 10.5 K/uL 10.8  11.6  12.4   Hemoglobin 13.0 - 17.0 g/dL 9.1  8.7  9.1   Hematocrit 39.0 - 52.0 % 28.2  27.2  28.0   Platelets 150 - 400 K/uL 92  93  136      BMET Recent Labs    05/06/24 0500 05/07/24 0415  NA 132* 135  K 3.7 4.2  CL 96* 103  CO2 24 21*  GLUCOSE 154* 143*  BUN 78* 74*  CREATININE 2.27* 1.94*  CALCIUM  8.6* 9.0     Studies/Results: No results found.  Assessment/Plan: 1) Urine leak s/p h/o prior radical cystectomy/ileal conduit urinary diversion in past:  He appears to be clinically improving and is appropriately diverted with decreased drain output and with renal function improving.  Would continue with nephrostomy drainage for now.  If continuing to clinically improve over the weekend, will consider diagnostic antegrade nephrostograms/loopogram early next  week to assess location of urine leak.  Urostomy stoma still appears pink and viable.    LOS: 27 days   Noretta Ferrara 05/07/2024, 7:03 AM

## 2024-05-07 NOTE — Progress Notes (Signed)
 PHARMACY - ANTICOAGULATION CONSULT NOTE  Pharmacy Consult for Heparin  Indication: h/o PE  Allergies  Allergen Reactions   Demerol  [Meperidine ] Nausea And Vomiting and Other (See Comments)    SEVERE NAUSEA    Patient Measurements: Height: 5' 11 (180.3 cm) Weight: 99.3 kg (218 lb 14.7 oz) IBW/kg (Calculated) : 75.3 HEPARIN  DW (KG): 100.8  Vital Signs: Temp: 98.3 F (36.8 C) (06/26 0736) Temp Source: Axillary (06/26 0736) BP: 122/72 (06/26 0700) Pulse Rate: 93 (06/26 0700)  Labs: Recent Labs    05/04/24 1020 05/05/24 0425 05/05/24 0426 05/06/24 0500 05/07/24 0415 05/07/24 0526  HGB  --   --   --  8.7*  --  9.1*  HCT  --   --   --  27.2*  --  28.2*  PLT  --   --   --  93*  --  92*  LABPROT 14.2  --   --   --   --   --   INR 1.1  --   --   --   --   --   HEPARINUNFRC  --   --  0.43 0.54 0.53  --   CREATININE  --  2.53*  --  2.27* 1.94*  --     Estimated Creatinine Clearance: 41.9 mL/min (A) (by C-G formula based on SCr of 1.94 mg/dL (H)).   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   moderate atypia - right low back   Atypical nevus 04/04/2007   moderate to marked - right upper back (wider shave)   Atypical nevus 04/04/2007   moderate atypia - center chest (wider shave)   Atypical nevus 04/04/2007   slight atypia - right thigh   Atypical nevus 11/29/2011   mild atypia - center upper back   Atypical nevus 11/29/2011   mild atypia - center chest   Bacteremia due to Klebsiella pneumoniae 10/09/2017   Bladder cancer (HCC) dx 07/2017   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 denies   Sleep apnea    cpap   Squamous cell carcinoma of skin 05/22/2013   left cheek - CX3 + 5FU   Wears glasses     Assessment:  Patient with prolonged hospitalization including post-op complications resulting in bowel perforation with open abdomen. Has been closed and transferred to [facility]. On Eliquis PTA for hx of PE. Has been on heparin  gtt since 6/6. Nephro started ESA + IV iron  x1, 6/21 Fe 54, TSAT 19, Ferr 588  AM heparin  level therapeutic at 0.53, HgB 9.1 and PLTs 92. Had some bleeding in ileostomy on 6/20 but no issues noted today per nursing.   Goal of Therapy:  Heparin  level 0.3-0.7 units/ml Monitor platelets by anticoagulation protocol:  Yes   Plan:  Continue heparin  1900u/hr.  CBC tomorrow.  Monitor for s/sx of bleeding.  Heparin  level daily Continue to hold DOAC - dysphagia 3 diet started, potential for calorie counts for determine need for G-tube.   Powell Blush, PharmD, BCCCP  05/07/24 7:52 AM

## 2024-05-07 NOTE — Progress Notes (Signed)
 Calorie Count Note  48 hour calorie count ordered.  Diet: Regular with thin liquids Supplements: Ensure Plus High Protein  Day 1 6/25 Breakfast: 25% french toast (33 cal, 1g protein), 25% bacon (27 cal, 1 g protein), 25% 2% milk (30 cal, 2 g protein) Lunch: 33% chicken (61 cal, 8g protein), 66% mashed potatoes (104 cal, 1 g protein), 33% green beans (8 cal) Dinner: 33% turkey (43 cal, 5g protein), 33% (48 cal), 100% ice cream (130 cal, 2g protein) Supplements: 2 full Ensure Plus High Protein (700 cal, 40 g protein)  Total intake: 1214 kcal (55% of minimum estimated needs)  60 protein (50% of minimum estimated needs)  Nutrition Dx: Increased nutrient needs related to acute illness as evidenced by estimated needs. - remains applicable  Goal: Patient will meet greater than or equal to 90% of their needs - progressing  Intervention:  Continue current diet as ordered Encourage PO intake when pt alert Ensure Plus High Protein po BID, each supplement provides 350 kcal and 20 grams of protein Continue the following until pt's PO intake improves.  Vital 1.5 at 120 ml/h x 12 hours (1440 ml per day) Prosource TF20 60 ml BID Provides 2320 kcal, 137 gm protein, 1100 ml free water  daily 1 packet Juven BID, each packet provides 95 calories, 2.5 grams of protein (collagen) + micronutrients to support wound healing  Josette Glance, MS, RDN, LDN Clinical Dietitian I Please reach out via secure chat

## 2024-05-07 NOTE — Progress Notes (Signed)
 Physical Therapy Treatment Patient Details Name: Kristopher Thompson MRN: [id number] DOB: [birth year] Today's Date: 05/07/2024   History of Present Illness 73 y/o M admitted to [facility] 5/30 for hernia repair and urostomy revision. 6/9 to OR for perforated bowel, post op shock, s/p exploratory lapartomy with drainage of Rt abdominal wall abscess. CRRT 6/11-6/14. Extubated 6/14, Transferred to [facility] 6/15 for intermittent HD. CRRT again 6/17-6/20. 6/23 bil perc nephrostomy tubes placed. PMHx: bladder and prostate CA, RBBB, CHF, colostomy, DM, GERD, abdominal surgery.    PT Comments  Pt pleasant and progressing well with mobility. Pt with dizziness and nausea with mobility despite stable VS. Encouraged looking up for gaze stabilization and core strength. Pt and wife educated for LB HEP with encouragement to perform and educated for OOB and transfer progression. Will continue to progress standing trials.   Supine 119/73 (87) HR 97 Sitting 144/89 (101) HR 109 SPO2 98% on RA   If plan is discharge home, recommend the following: Assistance with cooking/housework;Assist for transportation;Help with stairs or ramp for entrance;Two people to help with walking and/or transfers;Two people to help with bathing/dressing/bathroom;Supervision due to cognitive status   Can travel by private vehicle     No  Equipment Recommendations  Rolling walker (2 wheels);BSC/3in1    Recommendations for Other Services       Precautions / Restrictions Precautions Precautions: Fall;Other (comment) Precaution/Restrictions Comments: bil JP drains, ileostomy, urostomy, cortrak, abd wound vac, bil perc nephrostomy drains     Mobility  Bed Mobility Overal bed mobility: Needs Assistance Bed Mobility: Rolling, Sidelying to Sit Rolling: Mod assist Sidelying to sit: Mod assist       General bed mobility comments: cues for sequence with assist of pad to rotate pelvis, assist to clear legs and elevate trunk. mod assist to scoot  fully to EOB. Increased time to stabilize EOB due to dizziness with BP stable    Transfers Overall transfer level: Needs assistance   Transfers: Sit to/from Stand Sit to Stand: Mod assist, +2 physical assistance, From elevated surface Stand pivot transfers: Mod assist, +2 physical assistance         General transfer comment: mod +2 assist to stand from bed x 2 trials and from recliner x 1 trials with cues for hand placement, safety and function with pad at sacrum for additional support. Pt able to stand with upright posturing after cueing and step to chair. 3rd trial from chair with RW present and pt able to stand and march briefly with max encouragement    Ambulation/Gait                   Stairs             Wheelchair Mobility     Tilt Bed    Modified Rankin (Stroke Patients Only)       Balance Overall balance assessment: Needs assistance Sitting-balance support: Feet unsupported, No upper extremity supported Sitting balance-Leahy Scale: Fair Sitting balance - Comments: able to static sit EOB without UB support   Standing balance support: Bilateral upper extremity supported Standing balance-Leahy Scale: Poor Standing balance comment: RW or UB support on therapist/tech in standing                            Communication Communication Communication: No apparent difficulties  Cognition Arousal: Alert Behavior During Therapy: Flat affect   PT - Cognitive impairments: Safety/Judgement  Following commands: Impaired Following commands impaired: Follows one step commands with increased time    Cueing Cueing Techniques: Verbal cues, Gestural cues, Tactile cues  Exercises General Exercises - Lower Extremity Long Arc Quad: AROM, Both, 10 reps, Seated    General Comments        Pertinent Vitals/Pain Pain Assessment Pain Assessment: 0-10 Pain Score: 3  Pain Location: abdomen Pain Descriptors /  Indicators: Sore Pain Intervention(s): Limited activity within patient's tolerance, Monitored during session, Repositioned    Home Living                          Prior Function            PT Goals (current goals can now be found in the care plan section) Progress towards PT goals: Progressing toward goals    Frequency    Min 2X/week      PT Plan      Co-evaluation              AM-PAC PT 6 Clicks Mobility   Outcome Measure  Help needed turning from your back to your side while in a flat bed without using bedrails?: A Lot Help needed moving from lying on your back to sitting on the side of a flat bed without using bedrails?: A Lot Help needed moving to and from a bed to a chair (including a wheelchair)?: Total Help needed standing up from a chair using your arms (e.g., wheelchair or bedside chair)?: Total Help needed to walk in hospital room?: Total Help needed climbing 3-5 steps with a railing? : Total 6 Click Score: 8    End of Session Equipment Utilized During Treatment: Gait belt Activity Tolerance: Patient tolerated treatment well Patient left: with family/visitor present;in chair;with chair alarm set Nurse Communication: Mobility status PT Visit Diagnosis: Unsteadiness on feet (R26.81);Other abnormalities of gait and mobility (R26.89);Muscle weakness (generalized) (M62.81);Difficulty in walking, not elsewhere classified (R26.2)     Time: 9246-9177 PT Time Calculation (min) (ACUTE ONLY): 29 min  Charges:    $Therapeutic Activity: 23-37 mins PT General Charges $$ ACUTE PT VISIT: 1 Visit                     [name], PT Acute Rehabilitation Services Office: [phone]    Darcy Cordner B Jaqwan Wieber 05/07/2024, 10:02 AM

## 2024-05-08 DIAGNOSIS — K631 Perforation of intestine (nontraumatic): Secondary | ICD-10-CM | POA: Diagnosis not present

## 2024-05-08 LAB — BASIC METABOLIC PANEL WITH GFR
Anion gap: 13 (ref 5–15)
BUN: 90 mg/dL — ABNORMAL HIGH (ref 8–23)
CO2: 21 mmol/L — ABNORMAL LOW (ref 22–32)
Calcium: 9.4 mg/dL (ref 8.9–10.3)
Chloride: 99 mmol/L (ref 98–111)
Creatinine, Ser: 2.56 mg/dL — ABNORMAL HIGH (ref 0.61–1.24)
GFR, Estimated: 26 mL/min — ABNORMAL LOW (ref >60)
Glucose, Bld: 105 mg/dL — ABNORMAL HIGH (ref 70–99)
Potassium: 4.5 mmol/L (ref 3.5–5.1)
Sodium: 133 mmol/L — ABNORMAL LOW (ref 135–145)

## 2024-05-08 LAB — CBC
HCT: 31.1 % — ABNORMAL LOW (ref 39.0–52.0)
Hemoglobin: 9.9 g/dL — ABNORMAL LOW (ref 13.0–17.0)
MCH: 30 pg (ref 26.0–34.0)
MCHC: 31.8 g/dL (ref 30.0–36.0)
MCV: 94.2 fL (ref 80.0–100.0)
Platelets: 113 10*3/uL — ABNORMAL LOW (ref 150–400)
RBC: 3.3 MIL/uL — ABNORMAL LOW (ref 4.22–5.81)
RDW: 19 % — ABNORMAL HIGH (ref 11.5–15.5)
WBC: 12.3 10*3/uL — ABNORMAL HIGH (ref 4.0–10.5)
nRBC: 0 % (ref 0.0–0.2)

## 2024-05-08 LAB — GLUCOSE, CAPILLARY
Glucose-Capillary: 135 mg/dL — ABNORMAL HIGH (ref 70–99)
Glucose-Capillary: 105 mg/dL — ABNORMAL HIGH (ref 70–99)
Glucose-Capillary: 135 mg/dL — ABNORMAL HIGH (ref 70–99)
Glucose-Capillary: 118 mg/dL — ABNORMAL HIGH (ref 70–99)
Glucose-Capillary: 124 mg/dL — ABNORMAL HIGH (ref 70–99)
Glucose-Capillary: 172 mg/dL — ABNORMAL HIGH (ref 70–99)

## 2024-05-08 LAB — HEPARIN LEVEL (UNFRACTIONATED): Heparin Unfractionated: 0.66 [IU]/mL (ref 0.30–0.70)

## 2024-05-08 MED ORDER — ADULT MULTIVITAMIN W/MINERALS CH
1.0000 | ORAL_TABLET | Freq: Every day | ORAL | Status: DC
  Administered 2024-05-08 – 2024-05-22 (×14): 1 via ORAL
  Filled 2024-05-08 (×15): qty 1

## 2024-05-08 MED ORDER — IPRATROPIUM BROMIDE 0.03 % NA SOLN: 1.0000 | Freq: Three times a day (TID) | NASAL | Status: DC | PRN

## 2024-05-08 MED ORDER — ROSUVASTATIN CALCIUM 5 MG PO TABS
10.0000 mg | ORAL_TABLET | Freq: Every evening | ORAL | Status: DC
  Administered 2024-05-08 – 2024-05-22 (×15): 10 mg via ORAL
  Filled 2024-05-08 (×15): qty 2

## 2024-05-08 MED ORDER — SEVELAMER CARBONATE 800 MG PO TABS
800.0000 mg | ORAL_TABLET | Freq: Three times a day (TID) | ORAL | Status: DC
  Administered 2024-05-08 – 2024-05-15 (×18): 800 mg via ORAL
  Filled 2024-05-08 (×24): qty 1

## 2024-05-08 MED ORDER — VITAMIN B-12 1000 MCG PO TABS
1000.0000 ug | ORAL_TABLET | Freq: Every day | ORAL | Status: DC
  Administered 2024-05-08 – 2024-05-22 (×15): 1000 ug via ORAL
  Filled 2024-05-08 (×15): qty 1

## 2024-05-08 MED ORDER — ALTEPLASE 2 MG IJ SOLR
2.0000 mg | INTRAMUSCULAR | Status: AC
  Administered 2024-05-08: 2 mg
  Filled 2024-05-08: qty 2

## 2024-05-08 MED ORDER — VITAL 1.5 CAL PO LIQD
1440.0000 mL | ORAL | Status: DC
  Administered 2024-05-08: 1000 mL
  Administered 2024-05-09 – 2024-05-10 (×2): 1440 mL
  Filled 2024-05-08 (×3): qty 1659

## 2024-05-08 NOTE — Plan of Care (Signed)
  Problem: Education: Goal: Knowledge of General Education information will improve Description: Including pain rating scale, medication(s)/side effects and non-pharmacologic comfort measures Outcome: Progressing   Problem: Activity: Goal: Risk for activity intolerance will decrease Outcome: Progressing   Problem: Nutrition: Goal: Adequate nutrition will be maintained Outcome: Progressing   Problem: Elimination: Goal: Will not experience complications related to bowel motility Outcome: Progressing Goal: Will not experience complications related to urinary retention Outcome: Progressing   Problem: Safety: Goal: Ability to remain free from injury will improve Outcome: Progressing   Problem: Respiratory: Goal: Ability to maintain adequate ventilation will improve Outcome: Progressing

## 2024-05-08 NOTE — Progress Notes (Signed)
 Nutrition Follow-up  DOCUMENTATION CODES:   Not applicable  INTERVENTION:   Intervention:  Continue current diet as ordered Encourage PO intake when pt is alert Encourage family to bring in food for pt  Room service with assist  Continue Ensure Plus High Protein po BID, each supplement provides 350 kcal and 20 grams of protein Continue 1 packet Juven BID PO, each packet provides 95 calories, 2.5 grams of protein (collagen) + micronutrients to support wound healing Continue the following until pt's PO intake improves.  Vital 1.5 at 120 ml/h x 10 hours (1200 ml per day) Prosource TF20 60 ml BID  Provides 1960 kcal (89% of needs), 121 gm protein (100% of needs), 917 ml free water  daily  *Recommend decreasing tube feeds if PO intake stays consistent or starts to increase as pt is meeting 55% of his needs by PO.  Vital 1.5 at 60 ml/h x 12 hours (720 ml per day) Prosource TF20 60 ml BID  Provides 1240 kcal, 89 gm protein, 550 ml free water  daily   NUTRITION DIAGNOSIS:   Increased nutrient needs related to acute illness as evidenced by estimated needs. - Still applicable   GOAL:   Patient will meet greater than or equal to 90% of their needs - Meeting via TF's  MONITOR:   PO intake, TF tolerance, Labs, Weight trends, Skin  REASON FOR ASSESSMENT:   Consult Assessment of nutrition requirement/status (CRRT)  ASSESSMENT:   73 y.o male patient with history of Parastomal hernia of ileal conduit. On 5/30 underwent hernia repair with urostomy revision.  5/30 - Admit; s/p lysis of ahdesions, repair of incisional, parastomal, left inguinal incarcerated abdominal wall hernias, urostomy ileal conduit revision; CLD 6/2 - NPO; NGT placed for suction; TPN initiated 6/4 - TPN increased to goal; NGT removed by patient; CLD then back to NPO after emesis; NGT replaced 6/9 - OR in the early AM for ex-lap, drainage of right abdominal wall abscess, explantation fo abdominal mesh, small bowel  resection (left In discontinuity), AbThera wound VAC placement; returned to ICU intubated 6/10 - OR for washout and closure 6/11 - Worsening AKI with fluid overload, start CRRT 6/14 - extubated 6/15 - transferred to [facility] for iHD 6/16 - TPN discontinued, tolerating TF 6/17 - restarted on CRRT 6/19 - BSE DYS1 6/20 - BSE DYS2 6/20 - CRRT stopped   Pt doing well awake and alert, just transferred to progressive from ICU yesterday. Has been tolerating nocturnal tube feeds. Endorses some nausea but no vomiting or abdominal pain. This morning wife at bedside, breakfast just arrived during visit. Pt endorses having an appetite this morning. Encouraged pt to increase PO intake and family to bring in meals as able. 48 hour calorie count showed pt is increasing his intake but only meeting around 55% of his needs by PO.   MD adjusted nocturnal tube feeds to run for 10 hours. Discussed change in TF regimen with RN. If patient's PO intake is consistent recommend decreasing TF to run at 60 ml/h x 12 hours (720 ml per day)    Admit weight: 108.9 kg ? Accuracy, copied forward First measured weight: 98.4 kg (6/5) Current weight: 99.3 kg  Average Meal Intake: 6/25: 25-33% x 3 meals 6/26: 55-66% x 3 meals    Intake/Output Summary (Last 24 hours) at 05/08/2024 1348 Last data filed at 05/08/2024 1153 Gross per 24 hour  Intake 930.92 ml  Output 2895 ml  Net -1964.08 ml   Drains/Lines: JP Drain, right lateral abdomen,  700 mL x 24 hours JP Drain, left lateral abdomen, 15 mL x 24 hours Ileostomy RUQ, x 24 hours Wound Vac, upper medial abdomen, 125 mL output x 24 hours  Nutritionally Relevant Medications: Scheduled Meds:  vitamin B-12  1,000 mcg Oral Daily   darbepoetin (ARANESP ) injection - DIALYSIS  200 mcg Subcutaneous Q Fri-1800   feeding supplement  237 mL Oral BID BM   feeding supplement (PROSource TF20)  60 mL Per Tube BID   feeding supplement (VITAL 1.5 CAL)  1,440 mL Per Tube Q24H    insulin  aspart  0-20 Units Subcutaneous Q4H   insulin  aspart  6 Units Subcutaneous 3 times per day   insulin  glargine-yfgn  25 Units Subcutaneous QHS   loperamide  HCl  2 mg Oral QHS   multivitamin with minerals  1 tablet Oral Daily   nutrition supplement (JUVEN)  1 packet Oral BID BM   Continuous Infusions:  sodium chloride  Stopped (05/01/24 0014)   albumin  human     anticoagulant sodium citrate      heparin  1,900 Units/hr (05/08/24 0115)   micafungin  (MYCAMINE ) 100 mg in sodium chloride  0.9 % 100 mL IVPB 100 mg (05/08/24 1151)   piperacillin -tazobactam (ZOSYN )  IV 3.375 g (05/08/24 0602)   Labs Reviewed: BUN 74 Creatinine 1.94 Total protein 5.5 GFR 36 CBG ranges from 99-153 mg/dL over the last 24 hours HgbA1c 5.3   Diet Order:   Diet Order             Diet regular Room service appropriate? Yes; Fluid consistency: Thin  Diet effective now                   EDUCATION NEEDS:   Not appropriate for education at this time  Skin:  Skin Assessment: Skin Integrity Issues: Skin Integrity Issues:: Stage II, Wound VAC Stage II: Sacrum Wound Vac: Abdomen Incisions: 5/30 abdomen  Last BM:  6/27 via ileostomy  Height:   Ht Readings from Last 1 Encounters:  04/24/24 5' 11 (1.803 m)    Weight:   Wt Readings from Last 1 Encounters:  05/07/24 99.3 kg    Ideal Body Weight:  78.2 kg  BMI:  Body mass index is 30.53 kg/m.  Estimated Nutritional Needs:   Kcal:  2200-2500 kcal/d  Protein:  120-140g/d  Fluid:  2.2-2.5L/d  Olivia Kenning, RD Registered Dietitian  See Amion for more information

## 2024-05-08 NOTE — Progress Notes (Signed)
 Occupational Therapy Treatment Patient Details Name: Kristopher Thompson MRN: [id number] DOB: 04-16-1951 Today's Date: 05/08/2024   History of present illness 73 y/o M admitted to [facility] 5/30 for hernia repair and urostomy revision. 6/9 to OR for perforated bowel, post op shock, s/p exploratory lapartomy with drainage of Rt abdominal wall abscess. CRRT 6/11-6/14. Extubated 6/14, Transferred to [facility] 6/15 for intermittent HD. CRRT again 6/17-6/20. 6/23 bil perc nephrostomy tubes placed. PMHx: bladder and prostate CA, RBBB, CHF, colostomy, DM, GERD, abdominal surgery.   OT comments  Patient with fair progress toward patient focused goals.  Patient with continued Mod A for supine to sit, but needing Min A of 2 to stand at RW level, and Mod A of 1 for stand pivot.  Patient with Min A and setup for self feeding and light grooming once in the recliner.  OT to continue efforts in the acute setting to address deficits and Patient will benefit from continued inpatient follow up therapy, <3 hours/day.      If plan is discharge home, recommend the following:  Two people to help with walking and/or transfers;Two people to help with bathing/dressing/bathroom;Assistance with cooking/housework;Assistance with feeding;Direct supervision/assist for medications management;Direct supervision/assist for financial management;Assist for transportation;Help with stairs or ramp for entrance   Equipment Recommendations       Recommendations for Other Services      Precautions / Restrictions Precautions Precautions: Fall;Other (comment) Recall of Precautions/Restrictions: Impaired Precaution/Restrictions Comments: bil JP drains, ileostomy, urostomy, cortrak, abd wound vac, bil perc nephrostomy drains Restrictions Weight Bearing Restrictions Per Provider Order: No       Mobility Bed Mobility Overal bed mobility: Needs Assistance Bed Mobility: Sidelying to Sit   Sidelying to sit: Mod assist             Transfers Overall transfer level: Needs assistance Equipment used: Rolling walker (2 wheels) Transfers: Sit to/from Stand, Bed to chair/wheelchair/BSC Sit to Stand: +2 physical assistance, From elevated surface, Min assist Stand pivot transfers: Mod assist, +2 safety/equipment               Balance Overall balance assessment: Needs assistance Sitting-balance support: Feet unsupported, No upper extremity supported Sitting balance-Leahy Scale: Fair     Standing balance support: Bilateral upper extremity supported, Reliant on assistive device for balance Standing balance-Leahy Scale: Poor                             ADL either performed or assessed with clinical judgement   ADL   Eating/Feeding: Minimal assistance;Sitting   Grooming: Wash/dry hands;Wash/dry face;Set up;Sitting           Upper Body Dressing : Maximal assistance;Sitting   Lower Body Dressing: Maximal assistance;Sitting/lateral leans;Sit to/from stand   Toilet Transfer: Minimal assistance;+2 for physical assistance;+2 for safety/equipment;Rolling walker (2 wheels)                  Extremity/Trunk Assessment Upper Extremity Assessment Upper Extremity Assessment: Generalized weakness   Lower Extremity Assessment Lower Extremity Assessment: Defer to PT evaluation        Vision   Vision Assessment?: No apparent visual deficits   Perception Perception Perception: Not tested   Praxis Praxis Praxis: Not tested   Communication Communication Communication: No apparent difficulties Factors Affecting Communication: Reduced clarity of speech   Cognition Arousal: Alert Behavior During Therapy: Flat affect Cognition: Cognition impaired         Attention impairment (select first level of impairment):  Focused attention Executive functioning impairment (select all impairments): Initiation                   Following commands: Impaired Following commands impaired:  Follows one step commands with increased time      Cueing   Cueing Techniques: Verbal cues, Gestural cues, Tactile cues  Exercises      Shoulder Instructions       General Comments  HR to 133 with standing.    Pertinent Vitals/ Pain       Pain Assessment Pain Assessment: No/denies pain                                                          Frequency  Min 2X/week        Progress Toward Goals  OT Goals(current goals can now be found in the care plan section)  Progress towards OT goals: Progressing toward goals  Acute Rehab OT Goals OT Goal Formulation: With patient Time For Goal Achievement: 05/13/24 Potential to Achieve Goals: Fair  Plan      Co-evaluation    PT/OT/SLP Co-Evaluation/Treatment: Yes Reason for Co-Treatment: Complexity of the patient's impairments (multi-system involvement);Necessary to address cognition/behavior during functional activity;To address functional/ADL transfers   OT goals addressed during session: ADL's and self-care;Strengthening/ROM      AM-PAC OT 6 Clicks Daily Activity     Outcome Measure   Help from another person eating meals?: A Little Help from another person taking care of personal grooming?: A Little Help from another person toileting, which includes using toliet, bedpan, or urinal?: A Lot Help from another person bathing (including washing, rinsing, drying)?: A Lot Help from another person to put on and taking off regular upper body clothing?: A Lot Help from another person to put on and taking off regular lower body clothing?: A Lot 6 Click Score: 14    End of Session Equipment Utilized During Treatment: Rolling walker (2 wheels)  OT Visit Diagnosis: Unsteadiness on feet (R26.81);Other abnormalities of gait and mobility (R26.89);Muscle weakness (generalized) (M62.81)   Activity Tolerance Patient tolerated treatment well   Patient Left in chair;with call bell/phone within reach;with  chair alarm set   Nurse Communication Mobility status        Time: 1337-1400 OT Time Calculation (min): 23 min  Charges: OT General Charges $OT Visit: 1 Visit OT Treatments $Self Care/Home Management : 8-22 mins  05/08/2024  RP, OTR/L  Acute Rehabilitation Services  Office:  [phone]   Kristopher Thompson 05/08/2024, 2:09 PM

## 2024-05-08 NOTE — Progress Notes (Signed)
 Patient ID: Kristopher Thompson, male   DOB: 12-25-1950, 73 y.o.   MRN: [id number]  Renal function labs pending this morning.  Will continue with bilateral PCN drainage to divert urine.  Will discuss with IR about considering loopogram +/- bilateral antegrade nephrostogram studies on Monday if renal function has stabilized and he continues to clinically improve.

## 2024-05-08 NOTE — Progress Notes (Signed)
 PHARMACY - ANTICOAGULATION CONSULT NOTE  Pharmacy Consult for Heparin  Indication: h/o PE  Allergies  Allergen Reactions   Demerol  [Meperidine ] Nausea And Vomiting and Other (See Comments)    SEVERE NAUSEA    Patient Measurements: Height: 5' 11 (180.3 cm) Weight: 99.3 kg (218 lb 14.7 oz) IBW/kg (Calculated) : 75.3 HEPARIN  DW (KG): 100.8  Vital Signs: Temp: 98 F (36.7 C) (06/27 0733) Temp Source: Oral (06/27 0733) BP: 122/76 (06/27 0733) Pulse Rate: 97 (06/27 0733)  Labs: Recent Labs    05/06/24 0500 05/07/24 0415 05/07/24 0526 05/08/24 0631  HGB 8.7*  --  9.1* 9.9*  HCT 27.2*  --  28.2* 31.1*  PLT 93*  --  92* 113*  HEPARINUNFRC 0.54 0.53  --  0.66  CREATININE 2.27* 1.94*  --   --     Estimated Creatinine Clearance: 41.9 mL/min (A) (by C-G formula based on SCr of 1.94 mg/dL (H)).   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   moderate atypia - right low back   Atypical nevus 04/04/2007   moderate to marked - right upper back (wider shave)   Atypical nevus 04/04/2007   moderate atypia - center chest (wider shave)   Atypical nevus 04/04/2007   slight atypia - right thigh   Atypical nevus 11/29/2011   mild atypia - center upper back   Atypical nevus 11/29/2011   mild atypia - center chest   Bacteremia due to Klebsiella pneumoniae 10/09/2017   Bladder cancer (HCC) dx 07/2017   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 denies   Sleep apnea    cpap   Squamous cell carcinoma of skin 05/22/2013   left cheek - CX3 + 5FU   Wears glasses     Assessment:  Patient with prolonged hospitalization including post-op complications resulting in bowel perforation with open abdomen. Has been closed and transferred to [facility]. On Eliquis PTA for hx of PE. Has been on heparin  gtt since 6/6. Nephro started ESA + IV iron  x1, 6/21 Fe 54, TSAT 19, Ferr 588  HL 0.66 - therapeutic  Hgb 9.9, Plt 113  Goal of Therapy:  Heparin  level 0.3-0.7 units/ml Monitor platelets by anticoagulation protocol: Yes   Plan:  Continue heparin  at 1900u/hr Monitor for s/sx of bleeding.  Heparin  level and CBC daily Continue to hold DOAC - dysphagia 3 diet started, potential for calorie counts for determine need for G-tube. Pending possible porcedures  Sharyne Glatter, PharmD, BCCCP Clinical Pharmacist 05/08/2024 7:37 AM

## 2024-05-08 NOTE — Progress Notes (Signed)
 PROGRESS NOTE    Kristopher Thompson  FMW:[id number] DOB: 12/26/1950 DOA: 04/10/2024 PCP: [name], MD    Brief Narrative:   73 year old with history of prostate cancer, multiple abdominal surgeries, DM2, chronic anemia, PE on Eliquis at home, CHF with preserved EF, obesity initially presented to the hospital on 5/30 for robotic/laparoscopic surgery for incarcerated abdominal wall hernias requiring extensive lysis, mesh insertion, repair, urostomy ileal conduit revision thereafter admitted to the ICU due to tachycardia and tachypnea.  Initially placed on norepinephrine  and an arterial line was placed.  Patient declined NG tube placement but eventually agreed.  Patient was taken back to the OR on 6/9 due to perforated bowel and postop shock.  Required multiple pressors, steroids and PRBC transfusion after washout/closure in the OR.  On 6/11 developed AKI requiring dialysis..  Patient was extubated on 6/14.  04/10/2024-laparoscopic surgery 04/13/24 CCM consult. Incr NE. Art line  6/3 weaning NE. Gently diuresed w 3L off  6/4 off NE. Pulled out his NGT refused replacement. After multiple emesis  6/5 cont NGT 6/6 tx to med surg 6/9: taken back to OR for perforated bowel, post-op shock 6/10: Norepinephrine , Phenylephrine , and vasopressin , added steroids, 1u PRBCs, back to OR for washout and closure 6/11: Worsening AKI with fluid overload, start CRRT, decreasing pressor requirements 6/13 tolerating CRRT 6/14: Off CRRT, arousable.  Tolerated weaning for most of the day 6/13 6/14: Extubated, CRRT stopped. 6/15 transferred to [facility] for iHD; montoring for renal recovery 6/16 iHD, severely encephalopathic with high BUN 6/17 restarted CRRT for to ongoing uremia and encephalopathy 6/18 was awake enough to work with PT, OT 6/23 neph tubes placed 6/24 MS imrpoving, Neph tubes in and draining  Assessment & Plan:  Principal Problem:   Delayed bowel perforation s/p SBR/end ileostomy Active Problems:    S/P ileal conduit (HCC)   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 08/08/2017   GERD (gastroesophageal reflux disease)   Obesity (BMI 35.0-39.9 without comorbidity)   Prolonged QT interval   Non-recurrent bilateral inguinal hernia without obstruction or gangrene   Incarcerated incisional hernia   Sinus tachycardia   Tachypnea   Acute respiratory insufficiency, postoperative   Sepsis due to undetermined organism (HCC)   Lactic acidosis   Class 2 obesity   Chronic anticoagulation   Hearing loss   History of bladder cancer   Obstructive sleep apnea of adult   Parastomal hernia of ileal conduit   Partial small bowel obstruction (HCC)   Personal history of PE (pulmonary embolism)   Pressure injury of skin   Robotic hernia repair-- incisional, parastomal, left inguinal on 5/30 Septic shock due to acute peritonitis caused by bowel perforation - OR 6/9, back to the OR 6/10 for closure and ileal resection and end ileostomy creation Surgery service remains primary.  Postop management per their team.   Acute metabolic encephalopathy - Multifactorial due to uremia/ICU delirium.  Now this is improved    Acute kidney injury secondary to ischemic ATN/shock Bilateral hydronephrosis/obstruction Seen by nephrology and urology. Need both CRRT and HD. Last HD 6/23 Status post percutaneous nephrostomy tube 6/23 HD catheter removed, Holding off on new one in hopes he wont need any further HD   Diabetes type 2 Accu-Cheks and sliding scale, long-acting.  Adjust as necessary   Acute blood loss anemia Right flank hematoma History of PE, was on Eliquis at home Anemia of Chronic Dz.  Currently patient is on heparin  drip, hemoglobin is stable.  Will transition back to Eliquis when appropriate  ESA   Chronic HFpEF Essential HTN Looks euvolemic to hypovolemic, GDMT as tolerated.  Will resume when appropriate   Moderate protein calorie malnutrition Obesity Diet as tolerated, slowly will turn  off tube feeds   Deconditioning PT/OT- SNF   Currently patient is on heparin  drip, transition to p.o. anticoagulation once cleared by general surgery   Subjective: Seen at bedside, getting his wound care done Spouse present at bedside Patient has any complaints   Examination:  General exam: Appears calm and comfortable  Respiratory system: Clear to auscultation. Respiratory effort normal. Cardiovascular system: S1 & S2 heard, RRR. No JVD, murmurs, rubs, gallops or clicks. No pedal edema. Gastrointestinal system: Abdomen is nondistended, soft and nontender. No organomegaly or masses felt. Normal bowel sounds heard. Central nervous system: Alert and oriented. No focal neurological deficits. Extremities: Symmetric 5 x 5 power. Skin: No rashes, lesions or ulcers Psychiatry: Judgement and insight appear normal. Mood & affect appropriate. PICC line in place Bilateral urostomy Ileostomy Multiple drains in place Cortrak in place               Diet Orders (From admission, onward)     Start     Ordered   05/06/24 0806  Diet regular Room service appropriate? Yes; Fluid consistency: Thin  Diet effective now       Comments: If patient experiences nausea, vomiting, worsening pain, or worsening distention; then, make NPO with sips/ice chips only until seen by MD  Question Answer Comment  Room service appropriate? Yes   Fluid consistency: Thin      05/06/24 0805            Objective: Vitals:   05/07/24 2337 05/07/24 2358 05/08/24 0400 05/08/24 0733  BP:  121/73 117/71 122/76  Pulse:  (!) 107 (!) 107 97  Resp:  18 20 15   Temp: 97.6 F (36.4 C) 97.7 F (36.5 C) 97.6 F (36.4 C) 98 F (36.7 C)  TempSrc: Oral Oral Oral Oral  SpO2:  98% 97% 99%  Weight:      Height:        Intake/Output Summary (Last 24 hours) at 05/08/2024 0858 Last data filed at 05/08/2024 0500 Gross per 24 hour  Intake 1148.19 ml  Output 3290 ml  Net -2141.81 ml   Filed Weights    05/04/24 2200 05/06/24 0500 05/07/24 0500  Weight: 44.9 kg 100.4 kg 99.3 kg    Scheduled Meds:  Chlorhexidine  Gluconate Cloth  6 each Topical Daily   Chlorhexidine  Gluconate Cloth  6 each Topical Q0600   vitamin B-12  1,000 mcg Oral Daily   darbepoetin (ARANESP ) injection - DIALYSIS  200 mcg Subcutaneous Q Fri-1800   feeding supplement  237 mL Oral BID BM   feeding supplement (PROSource TF20)  60 mL Per Tube BID   feeding supplement (VITAL 1.5 CAL)  1,440 mL Per Tube Q24H   insulin  aspart  0-20 Units Subcutaneous Q4H   insulin  aspart  6 Units Subcutaneous 3 times per day   insulin  glargine-yfgn  25 Units Subcutaneous QHS   loperamide  HCl  2 mg Oral QHS   multivitamin with minerals  1 tablet Oral Daily   nutrition supplement (JUVEN)  1 packet Oral BID BM   mouth rinse  15 mL Mouth Rinse 4 times per day   polycarbophil  625 mg Oral BID   prochlorperazine   10 mg Intravenous Once   rosuvastatin   10 mg Oral QPM   sevelamer  carbonate  800 mg Oral TID WC  sodium chloride  flush  5 mL Intracatheter Q8H   Continuous Infusions:  sodium chloride  Stopped (05/01/24 0014)   albumin  human     anticoagulant sodium citrate      heparin  1,900 Units/hr (05/08/24 0115)   micafungin  (MYCAMINE ) 100 mg in sodium chloride  0.9 % 100 mL IVPB Stopped (05/07/24 1127)   piperacillin -tazobactam (ZOSYN )  IV 3.375 g (05/08/24 0602)    Nutritional status Signs/Symptoms: estimated needs Interventions: Tube feeding, Prostat, Juven Body mass index is 30.53 kg/m.  Data Reviewed:   CBC: Recent Labs  Lab 05/01/24 1313 05/04/24 0341 05/06/24 0500 05/07/24 0526 05/08/24 0631  WBC 16.4* 12.4* 11.6* 10.8* 12.3*  HGB 8.2* 9.1* 8.7* 9.1* 9.9*  HCT 26.3* 28.0* 27.2* 28.2* 31.1*  MCV 92.0 90.9 91.9 91.3 94.2  PLT 111* 136* 93* 92* 113*   Basic Metabolic Panel: Recent Labs  Lab 05/03/24 0453 05/04/24 0341 05/05/24 0425 05/06/24 0500 05/07/24 0415  NA 132* 133* 134* 132* 135  K 5.4* 5.0 4.1 3.7 4.2   CL 101 99 97* 96* 103  CO2 20* 21* 26 24 21*  GLUCOSE 170* 166* 184* 154* 143*  BUN 96* 109* 72* 78* 74*  CREATININE 3.60* 3.86* 2.53* 2.27* 1.94*  CALCIUM  8.4* 9.2 8.9 8.6* 9.0  MG 2.1 2.0 2.0 1.8 1.9  PHOS 6.5* 7.2* 5.2* 3.9 3.4   GFR: Estimated Creatinine Clearance: 41.9 mL/min (A) (by C-G formula based on SCr of 1.94 mg/dL (H)). Liver Function Tests: Recent Labs  Lab 05/03/24 0453 05/04/24 0341 05/05/24 0425 05/06/24 0500 05/07/24 0415  ALBUMIN  2.3* 2.4* 2.5* 2.5* 2.6*   No results for input(s): LIPASE, AMYLASE in the last 168 hours. No results for input(s): AMMONIA in the last 168 hours. Coagulation Profile: Recent Labs  Lab 05/04/24 1020  INR 1.1   Cardiac Enzymes: No results for input(s): CKTOTAL, CKMB, CKMBINDEX, TROPONINI in the last 168 hours. BNP (last 3 results) No results for input(s): PROBNP in the last 8760 hours. HbA1C: No results for input(s): HGBA1C in the last 72 hours. CBG: Recent Labs  Lab 05/07/24 1504 05/07/24 1938 05/07/24 2323 05/08/24 0359 05/08/24 0739  GLUCAP 99 153* 129* 135* 105*   Lipid Profile: No results for input(s): CHOL, HDL, LDLCALC, TRIG, CHOLHDL, LDLDIRECT in the last 72 hours. Thyroid  Function Tests: No results for input(s): TSH, T4TOTAL, FREET4, T3FREE, THYROIDAB in the last 72 hours. Anemia Panel: No results for input(s): VITAMINB12, FOLATE, FERRITIN, TIBC, IRON , RETICCTPCT in the last 72 hours. Sepsis Labs: No results for input(s): PROCALCITON, LATICACIDVEN in the last 168 hours.  No results found for this or any previous visit (from the past 240 hours).       Radiology Studies: No results found.         LOS: 28 days   Time spent= 35 mins    Burgess JAYSON Dare, MD [facility]  If 7PM-7AM, please contact night-coverage  05/08/2024, 8:58 AM

## 2024-05-08 NOTE — Progress Notes (Addendum)
 05/08/2024  Kristopher Thompson [id number] [birth year]  CARE TEAM: PCP: [name], MD  Outpatient Care Team: Patient Care Team: [name], MD as PCP - General (Internal Medicine) [name], MD (Inactive) as Consulting Physician (Dermatology) [name], MD as Referring Physician (Pulmonary Disease) [name], MD as Consulting Physician (General Surgery) [name], MD as Consulting Physician (Urology)  Inpatient Treatment Team: Treatment Team:  [name], MD [name], MD [name], MD Dolan Mateo Larger, MD Massie Delaine SAUNDERS, RN Norine Manuelita DELENA, MD Dea Shiner, MD Ruthellen Ruthellen [facility]iology, MD Claudene Claretta PARAS, RN Raoul Maiers, RN Bobbette Furth, MD Jeffie Charlie BIRCH, OT   Problem List:   Principal Problem:   Delayed bowel perforation s/p SBR/end ileostomy Active Problems:   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 08/08/2017   S/P ileal conduit (HCC)   GERD (gastroesophageal reflux disease)   Obesity (BMI 35.0-39.9 without comorbidity)   Prolonged QT interval   Non-recurrent bilateral inguinal hernia without obstruction or gangrene   Incarcerated incisional hernia   Sinus tachycardia   Tachypnea   Acute respiratory insufficiency, postoperative   Sepsis due to undetermined organism (HCC)   Lactic acidosis   Class 2 obesity   Chronic anticoagulation   Hearing loss   History of bladder cancer   Obstructive sleep apnea of adult   Parastomal hernia of ileal conduit   Partial small bowel obstruction (HCC)   Personal history of PE (pulmonary embolism)   Pressure injury of skin   04/10/2024  POST-OPERATIVE DIAGNOSIS:  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   PROCEDURE:   -ROBOTIC LYSIS OF ADHESIONS X 4 HOURS -COMPONENT SEPARATION - TRANSVERSUS ABDOMINIS REALEASE (TAR) BILATERAL -ROBOTIC REPAIRS OF  INCISIONAL, PARASTOMAL, LEFT INGUINAL  INCARCERATED ABDOMINAL WALL  HERNIAS WITH MESH -UROSTOMY ILEAL CONDUIT REVISION -INTRAOPERATIVE ASSESSMENT OF TISSUE VASCULAR PERFUSION USING ICG (indocyanine green ) -IMMUNOFLUORESCENCE -TRANSVERSUS ABDOMINIS PLANE (TAP) BLOCK - BILATERAL    SURGEON:  [name], MD   04/20/2024 POST-OPERATIVE DIAGNOSIS:  perforated bowel   PROCEDURE:  Drainage of right abdominal wall abscess as well as intra-abdominal abscess Explantation of abdominal mesh Small bowel resection (in discontinuity) Placement of ABThera wound VAC   SURGEON:  [name], MD  04/21/2024  POST-OPERATIVE DIAGNOSIS:  DELAYED BOWEL PERFORATION WITH OPEN ABDOMEN   PROCEDURE:   LYSIS OF ADHESIONS X ABDOMINAL WALL DEBRIDEMENT ILEAL RESECTION END ILEOSTOMY ABDOMINAL WALL PARASTOMAL & INCISIONAL HERNIA REPAIRS WITH PHASIX MESH FASCIA CLOSURE WITH WOUND VAC PLACEMENT IN SQ   SURGEON:  [name], MD    05/04/2024 Interventional Radiology Procedure Note   Procedure: Bilateral percutaneous nephrostomy tube placements   Findings: Please refer to procedural dictation for full description. 10 Fr bilateral to bag drainage.   Complications: None immediate   Estimated Blood Loss: < 5 ml   Recommendations: Keep to bag drainage.  IR will follow.  Ultimate management as per Urology.   Assessment  Incarcerated parastomal incisional hernias with obstructive symptoms status post robotic takedown and repair Delayed small bowel perforation s/p ileal resection and end ileostomy Delayed urine leak of urostomy ileal conduit status post perc nephrostomy tubes Multisystem organ failure gradually resolving  SLOWLY IMPROVING   Plan:  Delayed urine leak with AKI:  Urinary diversion with percutaneous nephrostomy tubes.  Still uremic but nonoliguric.  Nephrology following closely.  Trying to hold off on any dialysis this time with low threshold to proceed intermittently as needed.  Per Dr. Renda with Urology: plan probable IR  antegrade  nephrogram GU evaluation to help locate and perhaps stent or better control delayed urine leak since urostomy appears to be viable  Continue right lower quadrant abdominal wall and intraperitoneal drains for now.  If intraperitoneal remains low after next weeks intervention, output most likely can remove intraperitoneal drain and regroup.  Increased output from abdominal wall right lower quadrant drain last night atypical.  Regroup.  May need to reinvestigate nephrostomy tubes if persistent.  Defer to urology  Low threshold to repeat CAT scan of abdomen pelvis prior to that on Sunday/Monday.  Nutrition:  Regular diet as tolerated.  Speech therapy concurs  Calorie counts and see if we can gradually wean the tube feeds off over the next week if he eats better.  Looks like he is eating about a third of his meals..  Cycling tube feeds.  Try and do 10 hours to start weaning him off that.  IV get down to 8 hours if eating more than 50% of his meals.  Bowel regimen for ileostomy.  soluble polyfiber/FiberCon to thicken with nightly loperamide  seems to be working   [employer] 6/16   Hold off on G-tube unless poor PO effort >14days  Infection due to delayed bowel perforation and abdominal wall infection/necrosis  Growing multiple organisms including Candida albicans, Enterobacter, and Enterococcus.    IV Zosyn /micafungin .  Anticipate prolonged antibiotics given the abdominal wall infection/necrosis and need for Phasix mesh.   Change wound vac q MonWedFri the hopes for the subcutaneous tissues to recover.  Last change 6/25 showing early granulation without necrosis guardedly reassuring.      Metabolic encephalopathy slowly resolving.  Follow.  ABLA on top of anemia of chronic disease improved with transfusion.  Follow.    -monitor electrolytes & replace as needed.  Keep K>4, Mg>2, Phos>3.    -Diabetes.  Sliding scale insulin .    -VTE prophylaxis-  Full anticoagulation heparin  drip  given history of pulmonary embolism,.  Could consider back on DOAC enteral route once procedures done.  Perhaps day after nephrostograms done next week  -Mobilize as tolerated.  Agree with critical care about having physical and Occupational Therapy get more involved now that he is more alert.  Hopefully can rebuild strength and minimize further atrophy.  Tentatively saying FL 2 discussion about rehab.  I do not want this patient going to an LTAC.  He is not ready.  I updated the patient's status to the the patient & ICU RN in room.  Discussed with Dr. Annella with critical care.  Recommendations were made.  Questions were answered.  They expressed understanding & appreciation.  -Disposition: Felt stable by pulmonary critical care to no longer need ICU/stepdown.  They desire transition to progressive floor.  See if 4 N. progressive available since they handle general surgery patient's.  Need to have internal medicine help follow as well given his complexity.  Continue nephrology.  Pulmonary and cardiology as needed.      I reviewed nursing notes, last 24 h vitals and pain scores, last 48 h intake and output, last 24 h labs and trends, and last 24 h imaging results.  I have reviewed this patient's available data, including medical history, events of note, test results, etc as part of my evaluation.   A significant portion of that time was spent in counseling. Care during the described time interval was provided by me.  This care required high  level of medical decision making.  05/08/2024    Subjective: (Chief complaint)  Transferred to progressive care on 4NP.  Wife and nurse at bedside.  Patient more alert.  Feeling tired does not get out of bed.  Had nausea last night improved with medications.  Nurse concerned about increased right lower quadrant surgical Blake drain output  Objective:  Vital signs:  Vitals:   05/07/24 2300 05/07/24 2337 05/07/24 2358 05/08/24 0400  BP:  131/78  121/73 117/71  Pulse: (!) 108  (!) 107 (!) 107  Resp: 17  18 20   Temp:  97.6 F (36.4 C) 97.7 F (36.5 C) 97.6 F (36.4 C)  TempSrc:  Oral Oral Oral  SpO2: 97%  98% 97%  Weight:      Height:        Last BM Date : 05/07/24  Intake/Output   Yesterday:  06/26 0701 - 06/27 0700 In: 1079.7 [I.V.:304; NG/GT:578; IV Piggyback:187.7] Out: 2745 [Urine:900; Drains:745; Stool:1100] This shift:  Total I/O In: 618.8 [I.V.:75.9; Other:10; NG/GT:530; IV Piggyback:2.9] Out: 1080 [Urine:150; Drains:680; Stool:250]  Bowel function:  Flatus: YES  BM:  YES -small  Drains:  RLQ drain (rests between abdominal wall and Phasix mesh): Serous    LLQ drain (runs over bowel with tip down in RLQ pelvis near base of ileal conduit and site of bowel resection): Serous   Nephrostomy tubes in place.  Right light tea colored.  & Left light yellow-colored  Physical Exam:  General: Resting in no acute distress.   Eyes: PERRL, normal EOM.  Sclera clear.  No icterus Neuro: CN II-XII intact w/o focal sensory/motor deficits. Lymph: No head/neck/groin lymphadenopathy Psych:  No delerium/psychosis/paranoia.  No agitation HENT: Normocephalic, Mucus membranes moist.  No thrush.  ETT & NGT in place Neck: Supple, No tracheal deviation.  No obvious thyromegaly Chest: No pain to chest wall compression.  Good respiratory excursion.   CV:  Pulses intact.  Regular rhythm.  1-2+ BUE/BLE edema MS:  No obvious deformity  Abdomen:  Obese Soft.  Nondistended.  Nontender Wound vac (in SQ over closed fascia) in midline.    RUQ: (End ileostomy): Pink mucosa.  Green oatmeal thick effluent in bag RLQ:  (Urostomy ileal conduit): Pink mucosa without any obvious separation.  Minimal clear light yellow-colored urine  Ext:   No deformity.  Bilateral hand 0-1+ edema proved.  No lower extremity edema.  No cyanosis Skin: No petechiae / purpurea.  No major sores.  Warm and dry    Results:   Cultures: Recent Results  (from the past 720 hours)  Culture, blood (Routine X 2) w Reflex to ID Panel     Status: None   Collection Time: 04/13/24  7:13 AM   Specimen: BLOOD  Result Value Ref Range Status   Specimen Description   Final    BLOOD BLOOD LEFT ARM AEROBIC BOTTLE ONLY ANAEROBIC BOTTLE ONLY Performed at [facility], [address], [place], [place] [address]    Special Requests   Final    BOTTLES DRAWN AEROBIC AND ANAEROBIC Blood Culture results may not be optimal due to an inadequate volume of blood received in culture bottles Performed at [facility], [address], [place], [place] [address]    Culture   Final    NO GROWTH 5 DAYS Performed at [facility], [address] [place], [place] [address]    Report Status 04/18/2024 FINAL  Final  Culture, blood (Routine X 2) w Reflex to ID Panel     Status: None   Collection Time: 04/13/24  7:20 AM   Specimen: BLOOD  Result Value Ref Range Status   Specimen Description   Final    BLOOD BLOOD RIGHT ARM AEROBIC BOTTLE ONLY ANAEROBIC BOTTLE ONLY Performed at [facility], [address], [place], [place] [address]    Special Requests   Final    BOTTLES DRAWN AEROBIC AND ANAEROBIC Blood Culture results may not be optimal due to an inadequate volume of blood received in culture bottles Performed at [facility], [address] [place], [place] [address]    Culture   Final    NO GROWTH 5 DAYS Performed at [facility], [address] [place], [place] [address]    Report Status 04/18/2024 FINAL  Final  Urine Culture     Status: None   Collection Time: 04/13/24 10:18 AM   Specimen: Urine, Clean Catch  Result Value Ref Range Status   Specimen Description   Final    URINE, CLEAN CATCH Performed at [facility], [address] [place], [place] [address]    Special Requests   Final    NONE Performed at [facility], [address], [place], [place] [address]    Culture   Final    NO GROWTH Performed at [facility], [address] [place], [place] [address]    Report Status 04/14/2024 FINAL  Final  MRSA Next Gen by PCR, Nasal     Status: None   Collection Time: 04/13/24 11:35 AM   Specimen: Nasal Mucosa; Nasal Swab  Result Value Ref Range Status   MRSA by PCR Next Gen NOT DETECTED NOT DETECTED Final    Comment: (NOTE) The GeneXpert MRSA Assay (FDA approved for NASAL specimens only), is one component of a comprehensive MRSA colonization surveillance program. It is not intended to diagnose MRSA infection nor to guide or monitor treatment for MRSA infections. Test performance is not FDA approved in patients less than 64 years old. Performed at [facility], [address] [place], [place] [address]   Culture, blood (Routine X 2) w Reflex to ID Panel     Status: None   Collection Time: 04/20/24  8:46 AM   Specimen: BLOOD RIGHT ARM  Result Value Ref Range Status   Specimen Description   Final    BLOOD RIGHT ARM Performed at [facility], [address], [place], [place] [address]    Special Requests   Final    BOTTLES DRAWN AEROBIC AND ANAEROBIC Blood Culture results may not be optimal due to an inadequate volume of blood received in culture bottles Performed at [facility], [address] [place], [place] [address]    Culture   Final    NO GROWTH 5 DAYS Performed at [facility], [address] [place], [place] [address]    Report Status 04/25/2024 FINAL  Final  Culture, blood (Routine X 2) w Reflex to ID Panel     Status: None   Collection Time: 04/20/24  8:49 AM   Specimen: BLOOD  Result Value Ref Range Status   Specimen Description   Final    BLOOD Performed at [facility], [address], [place], [place] [address]    Special Requests   Final    BOTTLES DRAWN AEROBIC AND ANAEROBIC Blood Culture results may not be optimal  due to an inadequate volume of blood received in culture bottles Performed at [facility], [address], [place], [place] [address]    Culture   Final    NO GROWTH 5 DAYS Performed at [facility], [address] [place], [place] [address]    Report Status 04/25/2024 FINAL  Final  Culture, Respiratory w Gram Stain     Status: None   Collection Time: 04/20/24 12:03 PM   Specimen: Tracheal Aspirate; Respiratory  Result Value Ref Range Status   Specimen Description   Final    TRACHEAL ASPIRATE Performed at [facility], [address] [place], [place] [address]    Special Requests   Final    NONE Performed at [facility], [address], [place], [place] [address]    Gram Stain NO WBC SEEN RARE GRAM POSITIVE COCCI   Final   Culture   Final    RARE Normal respiratory flora-no Staph aureus or Pseudomonas seen Performed at [facility], [address], [place], [place] [address]    Report Status 04/23/2024 FINAL  Final  Aerobic/Anaerobic Culture w Gram Stain (surgical/deep wound)     Status: None   Collection Time: 04/21/24  2:23 PM   Specimen: Soft Tissue, Other  Result Value Ref Range Status   Specimen Description   Final    TISSUE INTRA ABDOMINAL NECROTIC TISSUE Performed at [facility], [address], [place], [place] [address]    Special Requests   Final    NONE Performed at [facility], [address], [place], [place] [address]    Gram Stain   Final    FEW WBC PRESENT,BOTH PMN AND MONONUCLEAR RARE GRAM POSITIVE COCCI IN PAIRS AND CHAINS RARE YEAST    Culture   Final    RARE ESCHERICHIA COLI FEW CANDIDA ALBICANS FEW ENTEROCOCCUS FAECALIS FEW BACTEROIDES SPECIES BETA LACTAMASE POSITIVE Performed at [facility], [address] [place], [place] [address]    Report Status 04/25/2024 FINAL  Final   Organism ID, Bacteria ESCHERICHIA COLI  Final    Organism ID, Bacteria ENTEROCOCCUS FAECALIS  Final      Susceptibility   Escherichia coli - MIC*    AMPICILLIN >=32 RESISTANT Resistant     CEFEPIME  <=0.12 SENSITIVE Sensitive     CEFTAZIDIME <=1 SENSITIVE Sensitive     CEFTRIAXONE  1 SENSITIVE Sensitive     CIPROFLOXACIN  <=0.25 SENSITIVE Sensitive     GENTAMICIN  <=1 SENSITIVE Sensitive     IMIPENEM <=0.25 SENSITIVE Sensitive     TRIMETH /SULFA  <=20 SENSITIVE Sensitive     AMPICILLIN/SULBACTAM >=32 RESISTANT Resistant     PIP/TAZO 8 SENSITIVE Sensitive ug/mL    * RARE ESCHERICHIA COLI   Enterococcus faecalis - MIC*    AMPICILLIN <=2 SENSITIVE Sensitive     VANCOMYCIN  1 SENSITIVE Sensitive     GENTAMICIN  SYNERGY SENSITIVE Sensitive     * FEW ENTEROCOCCUS FAECALIS    Labs: Results for orders placed or performed during the hospital encounter of 04/10/24 (from the past 48 hours)  Glucose, capillary     Status: Abnormal   Collection Time: 05/06/24  7:30 AM  Result Value Ref Range   Glucose-Capillary 145 (H) 70 - 99 mg/dL    Comment: Glucose reference range applies only to samples taken after fasting for at least 8 hours.  Glucose, capillary     Status: Abnormal   Collection Time: 05/06/24 11:21 AM  Result Value Ref Range   Glucose-Capillary 108 (H) 70 - 99 mg/dL  Comment: Glucose reference range applies only to samples taken after fasting for at least 8 hours.  Glucose, capillary     Status: Abnormal   Collection Time: 05/06/24  3:16 PM  Result Value Ref Range   Glucose-Capillary 148 (H) 70 - 99 mg/dL    Comment: Glucose reference range applies only to samples taken after fasting for at least 8 hours.  Glucose, capillary     Status: Abnormal   Collection Time: 05/06/24  7:55 PM  Result Value Ref Range   Glucose-Capillary 159 (H) 70 - 99 mg/dL    Comment: Glucose reference range applies only to samples taken after fasting for at least 8 hours.  Glucose, capillary     Status: Abnormal   Collection Time: 05/06/24 11:54 PM  Result  Value Ref Range   Glucose-Capillary 149 (H) 70 - 99 mg/dL    Comment: Glucose reference range applies only to samples taken after fasting for at least 8 hours.  Glucose, capillary     Status: Abnormal   Collection Time: 05/07/24  4:07 AM  Result Value Ref Range   Glucose-Capillary 151 (H) 70 - 99 mg/dL    Comment: Glucose reference range applies only to samples taken after fasting for at least 8 hours.  Renal function panel (daily at 0500)     Status: Abnormal   Collection Time: 05/07/24  4:15 AM  Result Value Ref Range   Sodium 135 135 - 145 mmol/L   Potassium 4.2 3.5 - 5.1 mmol/L   Chloride 103 98 - 111 mmol/L   CO2 21 (L) 22 - 32 mmol/L   Glucose, Bld 143 (H) 70 - 99 mg/dL    Comment: Glucose reference range applies only to samples taken after fasting for at least 8 hours.   BUN 74 (H) 8 - 23 mg/dL   Creatinine, Ser 8.05 (H) 0.61 - 1.24 mg/dL   Calcium  9.0 8.9 - 10.3 mg/dL   Phosphorus 3.4 2.5 - 4.6 mg/dL   Albumin  2.6 (L) 3.5 - 5.0 g/dL   GFR, Estimated 36 (L) >60 mL/min    Comment: (NOTE) Calculated using the CKD-EPI Creatinine Equation (2021)    Anion gap 11 5 - 15    Comment: Performed at [facility], [address] [place], [place] [address]  Magnesium      Status: None   Collection Time: 05/07/24  4:15 AM  Result Value Ref Range   Magnesium  1.9 1.7 - 2.4 mg/dL    Comment: Performed at [facility], [address] [place], [place] [address]  Heparin  level (unfractionated)     Status: None   Collection Time: 05/07/24  4:15 AM  Result Value Ref Range   Heparin  Unfractionated 0.53 0.30 - 0.70 IU/mL    Comment: (NOTE) The clinical reportable range upper limit is being lowered to >1.10 to align with the FDA approved guidance for the current laboratory assay.  If heparin  results are below expected values, and patient dosage has  been confirmed, suggest follow up testing of antithrombin III levels. Performed at [facility], [address],  [place], [place] [address]   CBC     Status: Abnormal   Collection Time: 05/07/24  5:26 AM  Result Value Ref Range   WBC 10.8 (H) 4.0 - 10.5 K/uL   RBC 3.09 (L) 4.22 - 5.81 MIL/uL   Hemoglobin 9.1 (L) 13.0 - 17.0 g/dL   HCT 71.7 (L) 60.9 - 47.9 %   MCV 91.3 80.0 -  100.0 fL   MCH 29.4 26.0 - 34.0 pg   MCHC 32.3 30.0 - 36.0 g/dL   RDW 81.6 (H) 88.4 - 84.4 %   Platelets 92 (L) 150 - 400 K/uL    Comment: Immature Platelet Fraction may be clinically indicated, consider ordering this additional test [id number] REPEATED TO VERIFY    nRBC 0.0 0.0 - 0.2 %    Comment: Performed at [facility], [address], [place], [place] [address]  Glucose, capillary     Status: Abnormal   Collection Time: 05/07/24  7:38 AM  Result Value Ref Range   Glucose-Capillary 106 (H) 70 - 99 mg/dL    Comment: Glucose reference range applies only to samples taken after fasting for at least 8 hours.  Glucose, capillary     Status: Abnormal   Collection Time: 05/07/24 11:28 AM  Result Value Ref Range   Glucose-Capillary 129 (H) 70 - 99 mg/dL    Comment: Glucose reference range applies only to samples taken after fasting for at least 8 hours.  Glucose, capillary     Status: None   Collection Time: 05/07/24  3:04 PM  Result Value Ref Range   Glucose-Capillary 99 70 - 99 mg/dL    Comment: Glucose reference range applies only to samples taken after fasting for at least 8 hours.  Glucose, capillary     Status: Abnormal   Collection Time: 05/07/24  7:38 PM  Result Value Ref Range   Glucose-Capillary 153 (H) 70 - 99 mg/dL    Comment: Glucose reference range applies only to samples taken after fasting for at least 8 hours.  Glucose, capillary     Status: Abnormal   Collection Time: 05/07/24 11:23 PM  Result Value Ref Range   Glucose-Capillary 129 (H) 70 - 99 mg/dL    Comment: Glucose reference range applies only to samples taken after fasting for at least 8 hours.  Glucose, capillary     Status: Abnormal    Collection Time: 05/08/24  3:59 AM  Result Value Ref Range   Glucose-Capillary 135 (H) 70 - 99 mg/dL    Comment: Glucose reference range applies only to samples taken after fasting for at least 8 hours.    Imaging / Studies: No results found.      Medications / Allergies: per chart  Antibiotics: Anti-infectives (From admission, onward)    Start     Dose/Rate Route Frequency Ordered Stop   05/06/24 1400  piperacillin -tazobactam (ZOSYN ) IVPB 3.375 g        3.375 g 12.5 mL/hr over 240 Minutes Intravenous Every 8 hours 05/06/24 1227     05/01/24 1400  piperacillin -tazobactam (ZOSYN ) IVPB 2.25 g  Status:  Discontinued        2.25 g 100 mL/hr over 30 Minutes Intravenous Every 8 hours 05/01/24 0957 05/06/24 1227   05/01/24 1045  micafungin  (MYCAMINE ) 100 mg in sodium chloride  0.9 % 100 mL IVPB        100 mg 105 mL/hr over 1 Hours Intravenous Every 24 hours 05/01/24 0949     04/29/24 1000  micafungin  (MYCAMINE ) 150 mg in sodium chloride  0.9 % 100 mL IVPB  Status:  Discontinued        150 mg 107.5 mL/hr over 1 Hours Intravenous Every 24 hours 04/28/24 1036 05/01/24 0949   04/28/24 1345  micafungin  (MYCAMINE ) 100 mg in sodium chloride  0.9 % 100 mL IVPB        100 mg 105 mL/hr over 1 Hours Intravenous  Once 04/28/24 1245 04/28/24 1354   04/28/24 1000  micafungin  (MYCAMINE ) 100 mg in sodium chloride  0.9 % 100 mL IVPB  Status:  Discontinued        100 mg 105 mL/hr over 1 Hours Intravenous Every 24 hours 04/27/24 1219 04/28/24 1036   04/21/24 1400  clindamycin  (CLEOCIN ) 900 mg, gentamicin  (GARAMYCIN ) 240 mg in sodium chloride  0.9 % 1,000 mL for intraperitoneal lavage  Status:  Discontinued         Irrigation To Surgery 04/21/24 1346 04/21/24 1618   04/20/24 1400  piperacillin -tazobactam (ZOSYN ) IVPB 3.375 g  Status:  Discontinued        3.375 g 12.5 mL/hr over 240 Minutes Intravenous Every 8 hours 04/20/24 0755 05/01/24 0957   04/20/24 1000  metroNIDAZOLE  (FLAGYL ) IVPB 500 mg  Status:   Discontinued        500 mg 100 mL/hr over 60 Minutes Intravenous Every 12 hours 04/20/24 0320 04/20/24 0752   04/20/24 1000  micafungin  (MYCAMINE ) 150 mg in sodium chloride  0.9 % 100 mL IVPB  Status:  Discontinued        150 mg 107.5 mL/hr over 1 Hours Intravenous Every 24 hours 04/20/24 0752 04/27/24 1219   04/20/24 0245  ceFEPIme  (MAXIPIME ) 2 g in sodium chloride  0.9 % 100 mL IVPB  Status:  Discontinued        2 g 200 mL/hr over 30 Minutes Intravenous Every 8 hours 04/20/24 0232 04/20/24 0752   04/20/24 0100  metroNIDAZOLE  (FLAGYL ) IVPB 500 mg        500 mg 100 mL/hr over 60 Minutes Intravenous On call to O.R. 04/20/24 0004 04/20/24 0100   04/14/24 1800  ceFEPIme  (MAXIPIME ) 2 g in sodium chloride  0.9 % 100 mL IVPB        2 g 200 mL/hr over 30 Minutes Intravenous Every 8 hours 04/14/24 1003 04/19/24 0957   04/14/24 1600  vancomycin  (VANCOCIN ) IVPB 1000 mg/200 mL premix  Status:  Discontinued        1,000 mg 200 mL/hr over 60 Minutes Intravenous Every 24 hours 04/13/24 1420 04/14/24 1000   04/14/24 1600  vancomycin  (VANCOREADY) IVPB 1500 mg/300 mL  Status:  Discontinued        1,500 mg 150 mL/hr over 120 Minutes Intravenous Every 24 hours 04/14/24 1002 04/16/24 0744   04/14/24 1200  vancomycin  (VANCOCIN ) IVPB 1000 mg/200 mL premix  Status:  Discontinued        1,000 mg 200 mL/hr over 60 Minutes Intravenous Every 24 hours 04/13/24 1053 04/13/24 1109   04/13/24 2200  ceFEPIme  (MAXIPIME ) 2 g in sodium chloride  0.9 % 100 mL IVPB  Status:  Discontinued        2 g 200 mL/hr over 30 Minutes Intravenous Every 12 hours 04/13/24 1036 04/13/24 1109   04/13/24 2200  ceFEPIme  (MAXIPIME ) 2 g in sodium chloride  0.9 % 100 mL IVPB  Status:  Discontinued        2 g 200 mL/hr over 30 Minutes Intravenous Every 12 hours 04/13/24 1425 04/14/24 1003   04/13/24 1500  vancomycin  (VANCOCIN ) 2,000 mg in sodium chloride  0.9 % 500 mL IVPB        2,000 mg 260 mL/hr over 120 Minutes Intravenous  Once 04/13/24 1409  04/13/24 1850   04/13/24 1500  ceFEPIme  (MAXIPIME ) 2 g in sodium chloride  0.9 % 100 mL IVPB  Status:  Discontinued        2 g 200 mL/hr over 30 Minutes Intravenous Every 12 hours 04/13/24 1420  04/13/24 1425   04/13/24 1500  metroNIDAZOLE  (FLAGYL ) IVPB 500 mg  Status:  Discontinued        500 mg 100 mL/hr over 60 Minutes Intravenous Every 12 hours 04/13/24 1420 04/17/24 0725   04/13/24 1200  vancomycin  (VANCOCIN ) 2,000 mg in sodium chloride  0.9 % 500 mL IVPB  Status:  Discontinued        2,000 mg 260 mL/hr over 120 Minutes Intravenous  Once 04/13/24 1052 04/13/24 1121   04/13/24 1100  metroNIDAZOLE  (FLAGYL ) IVPB 500 mg  Status:  Discontinued        500 mg 100 mL/hr over 60 Minutes Intravenous 2 times daily 04/13/24 1007 04/13/24 1109   04/13/24 1100  vancomycin  (VANCOCIN ) IVPB 1000 mg/200 mL premix  Status:  Discontinued        1,000 mg 200 mL/hr over 60 Minutes Intravenous  Once 04/13/24 1007 04/13/24 1020   04/13/24 1015  ceFEPIme  (MAXIPIME ) 2 g in sodium chloride  0.9 % 100 mL IVPB        2 g 200 mL/hr over 30 Minutes Intravenous STAT 04/13/24 1007 04/14/24 1721   04/10/24 2200  ceFAZolin  (ANCEF ) IVPB 2g/100 mL premix        2 g 200 mL/hr over 30 Minutes Intravenous Every 8 hours 04/10/24 1833 04/11/24 0531   04/10/24 0600  ceFAZolin  (ANCEF ) IVPB 2g/100 mL premix        2 g 200 mL/hr over 30 Minutes Intravenous On call to O.R. 04/10/24 0533 04/10/24 1526         Note: Portions of this report may have been transcribed using voice recognition software. Every effort was made to ensure accuracy; however, inadvertent computerized transcription errors may be present.   Any transcriptional errors that result from this process are unintentional.    Elspeth KYM Schultze, MD, FACS, MASCRS Esophageal, Gastrointestinal & Colorectal Surgery Robotic and Minimally Invasive Surgery  [facility] [address] [place], [place]  [address] [phone] Fax [phone] Main  CONTACT INFORMATION: Weekday (9AM-5PM): Call [facility] main office at [phone] Weeknight (5PM-9AM) or Weekend/Holiday: Check EPIC Web Links tab & use AMION (password  [email or web address]) for General Surgery [facility] coverage  Please, DO NOT use SecureChat  (it is not reliable communication to reach operating surgeons & will lead to a delay in care).   Epic staff messaging available for outptient concerns needing 1-2 business day response.      05/08/2024  6:37 AM

## 2024-05-08 NOTE — Progress Notes (Signed)
 Calorie Count Note  Please see follow up note on 6/27 for assessment.  48 hour calorie count ordered.  Diet: Regular with thin liquids  Supplements: Ensure High Protein   Day 1 6/25 Breakfast: 25% french toast (33 cal, 1g protein), 25% bacon (27 cal, 1 g protein), 25% 2% milk (30 cal, 2 g protein) Lunch: 33% chicken (61 cal, 8g protein), 66% mashed potatoes (104 cal, 1 g protein), 33% green beans (8 cal) Dinner: 33% turkey (43 cal, 5g protein), 33% (48 cal), 100% ice cream (130 cal, 2g protein) Supplements: 2 full Ensure Plus High Protein (700 cal, 40 g protein)   Total intake: 1214 kcal (55% of minimum estimated needs)  60 protein (50% of minimum estimated needs)  Day 2 6/26 Breakfast: 66% pancakes, 66% sausage (200 kcal, 6 gm protein) Lunch: 33% [place], 66% mac n cheese, 33% corn (200 kcal, 10 gm protein) Afternoon snack: 2/3 Chick fil a chicken sandwich (280 kcal, 10 gm protein) Dinner: 25% rice, 50% chicken, bites of peas, 25% cookie (175 kcal, 15 gm protein) Supplements: 100% Ensure High Protein (350 kcal, 20 gm protein)   Total intake: 1,205 kcal (55% of minimum estimated needs)  61 gm protein (51% of minimum estimated needs)  Estimated Nutritional Needs:  Kcal:  2200-2500 kcal/d Protein:  120-140g/d Fluid:  2.2-2.5L/d  Olivia Kenning, RD Registered Dietitian  See Amion for more information

## 2024-05-08 NOTE — Consult Note (Signed)
 WOC Nurse wound follow up Wound type: surgical Measurement: 18 cm x 6 cm x 3 cm Wound bed: red, moist, with adipose tissue visible, scant amount yellow slough scattered in wound Drainage (amount, consistency, odor) serosanguinous Periwound: intact Dressing procedure/placement/frequency: Removed old NPWT dressing (3 pieces black)   Filled wound with  __3__ piece of black foam  Sealed NPWT dressing at HG  Patient tolerated procedure well  WOC nurse will continue to provide NPWT dressing changed due to the complexity of the dressing change.   WOC Nurse ostomy follow up Stoma type/location: RLQ ileal conduit, RUQ ileostomy Stomal assessment/size:  see prior note dated 6/25 Peristomal assessment: see prior note dated 6/15 Treatment options for stomal/peristomal skin: 2 inch barrier ring  Output  Clear urine Liquid brown stool Ostomy pouching: 1pc. Soft convex urostomy and ileostomy pouch Education provided: Notes lifting on the outer edges of skin barrier for ileostomy, discussed with patient and spouse potential benefits from utilizing barrier extenders when at home (item is not available in the hospital setting), additionally discussed possible benefits of utilizing ostomy belt, patient anticipated to begin therapy soon. Enrolled patient in [place] Secure Start Discharge program: No   Current appliances are clean and intact, will maintain in place at this time.   WOC team will continue to provide NPWT dressing changed due to the complexity of the dressing change and ostomy teaching as needed.   Doyal Polite, RN, MSN, [facility] WOC Team

## 2024-05-08 NOTE — Progress Notes (Signed)
 Physical Therapy Treatment Patient Details Name: Kristopher Thompson MRN: [id number] DOB: 1951/10/20 Today's Date: 05/08/2024   History of Present Illness 73 y/o M admitted to [facility] 5/30 for hernia repair and urostomy revision. 6/9 to OR for perforated bowel, post op shock, s/p exploratory lapartomy with drainage of Rt abdominal wall abscess. CRRT 6/11-6/14. Extubated 6/14, Transferred to [facility] 6/15 for intermittent HD. CRRT again 6/17-6/20. 6/23 bil perc nephrostomy tubes placed. PMHx: bladder and prostate CA, RBBB, CHF, colostomy, DM, GERD, abdominal surgery.    PT Comments  Pt endorsing some dizziness with mobility, but subsides quickly and VSS with dizziness. Pt with improving activity tolerance and is requiring less physical assist overall, tolerating stand with steps to reach recliner requiring min-mod +2 assist. Pt would benefit from PT and OT seeing pt separately at this point given progression. Pt with tachycardia to 130s and DOE 2/4 with exertion, recovers well with rest. PT to continue to follow.    If plan is discharge home, recommend the following: Assistance with cooking/housework;Assist for transportation;Help with stairs or ramp for entrance;Supervision due to cognitive status;A lot of help with walking and/or transfers;A lot of help with bathing/dressing/bathroom   Can travel by private vehicle        Equipment Recommendations  Rolling walker (2 wheels);BSC/3in1    Recommendations for Other Services       Precautions / Restrictions Precautions Precautions: Fall;Other (comment) Recall of Precautions/Restrictions: Impaired Precaution/Restrictions Comments: bil JP drains, ileostomy, urostomy, cortrak, abd wound vac, bil perc nephrostomy drains Restrictions Weight Bearing Restrictions Per Provider Order: No     Mobility  Bed Mobility Overal bed mobility: Needs Assistance Bed Mobility: Sidelying to Sit, Rolling Rolling: Mod assist Sidelying to sit: Mod assist        General bed mobility comments: assist for trunk roll and power up into sitting, scoot to EOB with assist of bed pads. Pt endorsing dizziness upon first sitting, but improved with continued sitting and vss    Transfers Overall transfer level: Needs assistance Equipment used: Rolling walker (2 wheels) Transfers: Sit to/from Stand, Bed to chair/wheelchair/BSC Sit to Stand: +2 physical assistance, From elevated surface, Min assist Stand pivot transfers: Mod assist, +2 safety/equipment         General transfer comment: assist for power up, rise, steady, and pivotal steps to reach recliner. Cues for RW management and navigating to recliner. Pt tachycardic to 130s with this, DOE 2/4 but SPO2 97% on RA    Ambulation/Gait                   Stairs             Wheelchair Mobility     Tilt Bed    Modified Rankin (Stroke Patients Only)       Balance Overall balance assessment: Needs assistance Sitting-balance support: Feet unsupported, No upper extremity supported Sitting balance-Leahy Scale: Fair     Standing balance support: Bilateral upper extremity supported, Reliant on assistive device for balance Standing balance-Leahy Scale: Poor                              Communication Communication Communication: No apparent difficulties Factors Affecting Communication: Reduced clarity of speech  Cognition Arousal: Alert Behavior During Therapy: Flat affect   PT - Cognitive impairments: Safety/Judgement                       PT - Cognition Comments:  slow processing, complicated by [facility] Following commands: Impaired Following commands impaired: Follows one step commands with increased time    Cueing Cueing Techniques: Verbal cues, Gestural cues, Tactile cues  Exercises      General Comments        Pertinent Vitals/Pain Pain Assessment Pain Assessment: No/denies pain Pain Intervention(s): Monitored during session    Home Living                           Prior Function            PT Goals (current goals can now be found in the care plan section) Acute Rehab PT Goals Patient Stated Goal: Regain independence PT Goal Formulation: With patient Time For Goal Achievement: 05/13/24 Potential to Achieve Goals: Good Progress towards PT goals: Progressing toward goals    Frequency    Min 2X/week      PT Plan      Co-evaluation PT/OT/SLP Co-Evaluation/Treatment: Yes Reason for Co-Treatment: Complexity of the patient's impairments (multi-system involvement);Necessary to address cognition/behavior during functional activity;To address functional/ADL transfers PT goals addressed during session: Mobility/safety with mobility;Balance OT goals addressed during session: ADL's and self-care;Strengthening/ROM      AM-PAC PT 6 Clicks Mobility   Outcome Measure  Help needed turning from your back to your side while in a flat bed without using bedrails?: A Lot Help needed moving from lying on your back to sitting on the side of a flat bed without using bedrails?: A Lot Help needed moving to and from a bed to a chair (including a wheelchair)?: A Lot Help needed standing up from a chair using your arms (e.g., wheelchair or bedside chair)?: A Lot Help needed to walk in hospital room?: A Lot Help needed climbing 3-5 steps with a railing? : Total 6 Click Score: 11    End of Session   Activity Tolerance: Patient tolerated treatment well Patient left: with family/visitor present;in chair;with chair alarm set;with call bell/phone within reach Nurse Communication: Mobility status PT Visit Diagnosis: Unsteadiness on feet (R26.81);Other abnormalities of gait and mobility (R26.89);Muscle weakness (generalized) (M62.81);Difficulty in walking, not elsewhere classified (R26.2)     Time: 8666-8597 PT Time Calculation (min) (ACUTE ONLY): 29 min  Charges:    $Therapeutic Activity: 8-22 mins PT General Charges $$ ACUTE  PT VISIT: 1 Visit                     Johana RAMAN, PT DPT Acute Rehabilitation Services Secure Chat Preferred  Office [phone]    Khang Hannum [place] 05/08/2024, 3:47 PM

## 2024-05-09 ENCOUNTER — Inpatient Hospital Stay (HOSPITAL_COMMUNITY)

## 2024-05-09 DIAGNOSIS — R9431 Abnormal electrocardiogram [ECG] [EKG]: Secondary | ICD-10-CM

## 2024-05-09 DIAGNOSIS — E66812 Obesity, class 2: Secondary | ICD-10-CM

## 2024-05-09 DIAGNOSIS — K4020 Bilateral inguinal hernia, without obstruction or gangrene, not specified as recurrent: Secondary | ICD-10-CM

## 2024-05-09 DIAGNOSIS — K631 Perforation of intestine (nontraumatic): Secondary | ICD-10-CM | POA: Diagnosis not present

## 2024-05-09 DIAGNOSIS — K435 Parastomal hernia without obstruction or  gangrene: Secondary | ICD-10-CM

## 2024-05-09 DIAGNOSIS — Z7901 Long term (current) use of anticoagulants: Secondary | ICD-10-CM

## 2024-05-09 DIAGNOSIS — E872 Acidosis, unspecified: Secondary | ICD-10-CM

## 2024-05-09 DIAGNOSIS — Z936 Other artificial openings of urinary tract status: Secondary | ICD-10-CM

## 2024-05-09 DIAGNOSIS — C679 Malignant neoplasm of bladder, unspecified: Secondary | ICD-10-CM | POA: Diagnosis not present

## 2024-05-09 DIAGNOSIS — A419 Sepsis, unspecified organism: Secondary | ICD-10-CM | POA: Diagnosis not present

## 2024-05-09 DIAGNOSIS — K56600 Partial intestinal obstruction, unspecified as to cause: Secondary | ICD-10-CM

## 2024-05-09 DIAGNOSIS — R000 Tachycardia, unspecified: Secondary | ICD-10-CM | POA: Diagnosis not present

## 2024-05-09 DIAGNOSIS — J9589 Other postprocedural complications and disorders of respiratory system, not elsewhere classified: Secondary | ICD-10-CM

## 2024-05-09 LAB — GLUCOSE, CAPILLARY
Glucose-Capillary: 118 mg/dL — ABNORMAL HIGH (ref 70–99)
Glucose-Capillary: 133 mg/dL — ABNORMAL HIGH (ref 70–99)
Glucose-Capillary: 137 mg/dL — ABNORMAL HIGH (ref 70–99)
Glucose-Capillary: 149 mg/dL — ABNORMAL HIGH (ref 70–99)
Glucose-Capillary: 155 mg/dL — ABNORMAL HIGH (ref 70–99)
Glucose-Capillary: 99 mg/dL (ref 70–99)

## 2024-05-09 LAB — RENAL FUNCTION PANEL
Albumin: 2.6 g/dL — ABNORMAL LOW (ref 3.5–5.0)
Anion gap: 14 (ref 5–15)
BUN: 89 mg/dL — ABNORMAL HIGH (ref 8–23)
CO2: 20 mmol/L — ABNORMAL LOW (ref 22–32)
Calcium: 9.2 mg/dL (ref 8.9–10.3)
Chloride: 98 mmol/L (ref 98–111)
Creatinine, Ser: 2.84 mg/dL — ABNORMAL HIGH (ref 0.61–1.24)
GFR, Estimated: 23 mL/min — ABNORMAL LOW (ref >60)
Glucose, Bld: 139 mg/dL — ABNORMAL HIGH (ref 70–99)
Phosphorus: 4.7 mg/dL — ABNORMAL HIGH (ref 2.5–4.6)
Potassium: 4.6 mmol/L (ref 3.5–5.1)
Sodium: 132 mmol/L — ABNORMAL LOW (ref 135–145)

## 2024-05-09 LAB — CBC
HCT: 31.5 % — ABNORMAL LOW (ref 39.0–52.0)
Hemoglobin: 10.2 g/dL — ABNORMAL LOW (ref 13.0–17.0)
MCH: 29.6 pg (ref 26.0–34.0)
MCHC: 32.4 g/dL (ref 30.0–36.0)
MCV: 91.3 fL (ref 80.0–100.0)
Platelets: 124 10*3/uL — ABNORMAL LOW (ref 150–400)
RBC: 3.45 MIL/uL — ABNORMAL LOW (ref 4.22–5.81)
RDW: 18.7 % — ABNORMAL HIGH (ref 11.5–15.5)
WBC: 11.5 10*3/uL — ABNORMAL HIGH (ref 4.0–10.5)
nRBC: 0 % (ref 0.0–0.2)

## 2024-05-09 LAB — HEPARIN LEVEL (UNFRACTIONATED): Heparin Unfractionated: 0.52 [IU]/mL (ref 0.30–0.70)

## 2024-05-09 NOTE — Plan of Care (Signed)
  Problem: Education: Goal: Knowledge of General Education information will improve Description: Including pain rating scale, medication(s)/side effects and non-pharmacologic comfort measures Outcome: Progressing   Problem: Pain Managment: Goal: General experience of comfort will improve and/or be controlled Outcome: Progressing   Problem: Safety: Goal: Ability to remain free from injury will improve Outcome: Progressing   Problem: Nutrition: Goal: Adequate nutrition will be maintained Outcome: Not Progressing

## 2024-05-09 NOTE — Progress Notes (Signed)
 05/09/2024  Kristopher Thompson [id number] [birth year]  CARE TEAM: PCP: [name], MD  Outpatient Care Team: Patient Care Team: [name], MD as PCP - General (Internal Medicine) [name], MD (Inactive) as Consulting Physician (Dermatology) [name], MD as Referring Physician (Pulmonary Disease) [name], MD as Consulting Physician (General Surgery) [name], MD as Consulting Physician (Urology)  Inpatient Treatment Team: Treatment Team:  [name], MD [name], MD [name], MD Massie Delaine SAUNDERS, RN Dea Shiner, MD Ruthellen [facility], MD Raoul Maiers, RN [name], MD [name], RN [name], [name], NT [place], [place], NT [name], RN Barnes, [name], LCSW [place], [place] D, [place]   Problem List:   Principal Problem:   Delayed bowel perforation s/p SBR/end ileostomy Active Problems:   S/P ileal conduit [facility])   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 08/08/2017   GERD (gastroesophageal reflux disease)   Obesity (BMI 35.0-39.9 without comorbidity)   Prolonged QT interval   Non-recurrent bilateral inguinal hernia without obstruction or gangrene   Incarcerated incisional hernia   Sinus tachycardia   Tachypnea   Acute respiratory insufficiency, postoperative   Sepsis due to undetermined organism (HCC)   Lactic acidosis   Class 2 obesity   Chronic anticoagulation   Hearing loss   History of bladder cancer   Obstructive sleep apnea of adult   Parastomal hernia of ileal conduit   Partial small bowel obstruction (HCC)   Personal history of PE (pulmonary embolism)   Pressure injury of skin   04/10/2024  POST-OPERATIVE DIAGNOSIS:  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   PROCEDURE:   -ROBOTIC LYSIS OF ADHESIONS X 4 HOURS -COMPONENT SEPARATION - TRANSVERSUS ABDOMINIS REALEASE (TAR) BILATERAL -ROBOTIC REPAIRS OF  INCISIONAL,  PARASTOMAL, LEFT INGUINAL  INCARCERATED ABDOMINAL WALL HERNIAS WITH MESH -UROSTOMY ILEAL CONDUIT REVISION -INTRAOPERATIVE ASSESSMENT OF TISSUE VASCULAR PERFUSION USING ICG (indocyanine green ) -IMMUNOFLUORESCENCE -TRANSVERSUS ABDOMINIS PLANE (TAP) BLOCK - BILATERAL    SURGEON:  [name], MD   04/20/2024 POST-OPERATIVE DIAGNOSIS:  perforated bowel   PROCEDURE:  Drainage of right abdominal wall abscess as well as intra-abdominal abscess Explantation of abdominal mesh Small bowel resection (in discontinuity) Placement of ABThera wound VAC   SURGEON:  [name], MD  04/21/2024  POST-OPERATIVE DIAGNOSIS:  DELAYED BOWEL PERFORATION WITH OPEN ABDOMEN   PROCEDURE:   LYSIS OF ADHESIONS X ABDOMINAL WALL DEBRIDEMENT ILEAL RESECTION END ILEOSTOMY ABDOMINAL WALL PARASTOMAL & INCISIONAL HERNIA REPAIRS WITH PHASIX MESH FASCIA CLOSURE WITH WOUND VAC PLACEMENT IN SQ   SURGEON:  [name], MD    05/04/2024 Interventional Radiology Procedure Note   Procedure: Bilateral percutaneous nephrostomy tube placements   Findings: Please refer to procedural dictation for full description. 10 Fr bilateral to bag drainage.   Complications: None immediate   Estimated Blood Loss: < 5 ml   Recommendations: Keep to bag drainage.  IR will follow.  Ultimate management as per Urology.   Assessment  Incarcerated parastomal incisional hernias with obstructive symptoms status post robotic takedown and repair Delayed small bowel perforation s/p ileal resection and end ileostomy Delayed urine leak of urostomy ileal conduit status post perc nephrostomy tubes Multisystem organ failure gradually resolving  SLOWLY IMPROVING   Plan:  Delayed urine leak with AKI:  Urinary diversion with percutaneous nephrostomy tubes.  Still uremic but nonoliguric.  Nephrology following closely.  Trying to hold off on any dialysis this time with low threshold to proceed intermittently as  needed.  Per Dr. Renda with Urology: plan probable IR antegrade nephrogram GU evaluation to help locate and perhaps stent or better control delayed urine leak since urostomy appears to be viable  Continue right lower quadrant abdominal wall and intraperitoneal drains for now.  If intraperitoneal remains low after next weeks intervention, output most likely can remove intraperitoneal drain and regroup.  Increased output from abdominal wall right lower quadrant drain last night atypical.  Regroup.  May need to reinvestigate nephrostomy tubes if persistent, JP output up today will discuss with MD.  Defer to urology  Low threshold to repeat CAT scan of abdomen pelvis prior to that on Sunday/Monday.  Nutrition:  Regular diet as tolerated.  Speech therapy concurs  Calorie counts and see if we can gradually wean the tube feeds off over the next week if he eats better.  Looks like he is eating about a third of his meals..  Cycling tube feeds.  Try and do 10 hours to start weaning him off that.  IV get down to 8 hours if eating more than 50% of his meals.  Bowel regimen for ileostomy.  soluble polyfiber/FiberCon to thicken with nightly loperamide  seems to be working   [employer] 6/16   Hold off on G-tube unless poor PO effort >14days  Infection due to delayed bowel perforation and abdominal wall infection/necrosis  Growing multiple organisms including Candida albicans, Enterobacter, and Enterococcus.    IV Zosyn /micafungin .  Anticipate prolonged antibiotics given the abdominal wall infection/necrosis and need for Phasix mesh.   Change wound vac q MonWedFri the hopes for the subcutaneous tissues to recover.  Last change 6/25 showing early granulation without necrosis guardedly reassuring. - Will discuss with Dr. Sheldon Monday but can likely go to M-R     Metabolic encephalopathy slowly resolving.  Follow.  ABLA on top of anemia of chronic disease improved with transfusion.  Follow.     -monitor electrolytes & replace as needed.  Keep K>4, Mg>2, Phos>3.    -Diabetes.  Sliding scale insulin .    -VTE prophylaxis-  Full anticoagulation heparin  drip given history of pulmonary embolism,.  Could consider back on DOAC enteral route once procedures done.  Perhaps day after nephrostograms done next week  -Mobilize as tolerated.  Agree with critical care about having physical and Occupational Therapy get more involved now that he is more alert.  Hopefully can rebuild strength and minimize further atrophy.  Tentatively saying [place] 2 discussion about rehab.  I do not want this patient going to an LTAC.  He is not ready.  SOB and ST elevation noted on Tele Check EKG and CXR this AM, denies chest pain. Stable sinus tachycardia in the 90s-low 100s. Not hypotensive or hypoxic.   I updated the patient's status to the the patient & his wife in room.    Questions were answered.  They expressed understanding & appreciation.  -Disposition: 4NP.  Need to have internal medicine help follow as well given his complexity.  Continue nephrology.  Pulmonary and cardiology as needed.      I reviewed nursing notes, last 24 h vitals and pain scores, last 48 h intake and output, last 24 h labs and trends, and last 24 h imaging results.  I have reviewed this patient's available data, including medical history, events of note, test results, etc as part of my evaluation.   A significant portion of that time was spent in counseling. Care during the described time interval was provided by me.  This care  required high  level of medical decision making.  05/09/2024    Subjective: (Chief complaint)  Reports feeling weaker and more tired. Has some increased SOB. PO intake stable but not improved.  Nurse concerned about increased right lower quadrant surgical Blake drain output  Objective:  Vital signs:  Vitals:   05/08/24 1925 05/08/24 2315 05/09/24 0335 05/09/24 0734  BP: 132/70 121/74 133/80  109/69  Pulse: (!) 105 (!) 107 98 97  Resp: 16 17 (!) 23 18  Temp: 97.6 F (36.4 C) 97.7 F (36.5 C) 98.1 F (36.7 C) 98.2 F (36.8 C)  TempSrc: Oral Oral Axillary Oral  SpO2: 98% 97% 96% 97%  Weight:      Height:        Last BM Date : 05/07/24  Intake/Output   Yesterday:  06/27 0701 - 06/28 0700 In: -  Out: 6942 [Imjpwd:7817; Stool:875] This shift:  Total I/O In: -  Out: 100 [Drains:100]  Bowel function:  Flatus: YES  BM:  YES -small  Drains:  RLQ drain (rests between abdominal wall and Phasix mesh): Serous    LLQ drain (runs over bowel with tip down in RLQ pelvis near base of ileal conduit and site of bowel resection): Serosanguinous  Nephrostomy tubes in place.  Right light tea colored.  & Left light yellow-colored  Physical Exam:  General: Resting in no acute distress.   Eyes: PERRL, normal EOM.  Sclera clear.  No icterus Neuro: CN II-XII intact w/o focal sensory/motor deficits. Lymph: No head/neck/groin lymphadenopathy Psych:  No delerium/psychosis/paranoia.  No agitation HENT: Normocephalic, Mucus membranes moist.  No thrush.  ETT & NGT in place Neck: Supple, No tracheal deviation.  No obvious thyromegaly Chest: No pain to chest wall compression.  Good respiratory excursion.   CV:  Pulses intact.  Regular rhythm.  1-2+ BUE/BLE edema MS:  No obvious deformity  Abdomen:  Obese Soft.  Nondistended.  Nontender Wound vac (in SQ over closed fascia) in midline.    RUQ: (End ileostomy): Pink mucosa.  brown oatmeal thick effluent in bag RLQ:  (Urostomy ileal conduit): Pink mucosa without any obvious separation.  Minimal clear light yellow-colored urine  Ext:   No deformity.  Bilateral hand 0-1+ edema proved.  No lower extremity edema.  No cyanosis Skin: No petechiae / purpurea.  No major sores.  Warm and dry    Results:   Cultures: Recent Results (from the past 720 hours)  Culture, blood (Routine X 2) w Reflex to ID Panel     Status: None   Collection  Time: 04/13/24  7:13 AM   Specimen: BLOOD  Result Value Ref Range Status   Specimen Description   Final    BLOOD BLOOD LEFT ARM AEROBIC BOTTLE ONLY ANAEROBIC BOTTLE ONLY Performed at [facility], [address] [place], [place] [address]    Special Requests   Final    BOTTLES DRAWN AEROBIC AND ANAEROBIC Blood Culture results may not be optimal due to an inadequate volume of blood received in culture bottles Performed at [facility], [address], [place], [place] [address]    Culture   Final    NO GROWTH 5 DAYS Performed at [facility], [address], [place], [place] [address]    Report Status 04/18/2024 FINAL  Final  Culture, blood (Routine X 2) w Reflex to ID Panel     Status: None   Collection Time: 04/13/24  7:20 AM   Specimen: BLOOD  Result  Value Ref Range Status   Specimen Description   Final    BLOOD BLOOD RIGHT ARM AEROBIC BOTTLE ONLY ANAEROBIC BOTTLE ONLY Performed at [facility], [address], [place], [place] [address]    Special Requests   Final    BOTTLES DRAWN AEROBIC AND ANAEROBIC Blood Culture results may not be optimal due to an inadequate volume of blood received in culture bottles Performed at [facility], [address], [place], [place] [address]    Culture   Final    NO GROWTH 5 DAYS Performed at [facility], [address] [place], [place] [address]    Report Status 04/18/2024 FINAL  Final  Urine Culture     Status: None   Collection Time: 04/13/24 10:18 AM   Specimen: Urine, Clean Catch  Result Value Ref Range Status   Specimen Description   Final    URINE, CLEAN CATCH Performed at [facility], [address] [place], [place] [address]    Special Requests   Final    NONE Performed at [facility], [address] [place], [place] [address]    Culture   Final    NO GROWTH Performed at [facility], [address], [place], [place] [address]    Report Status 04/14/2024 FINAL  Final  MRSA Next Gen by PCR, Nasal     Status: None   Collection Time: 04/13/24 11:35 AM   Specimen: Nasal Mucosa; Nasal Swab  Result Value Ref Range Status   MRSA by PCR Next Gen NOT DETECTED NOT DETECTED Final    Comment: (NOTE) The GeneXpert MRSA Assay (FDA approved for NASAL specimens only), is one component of a comprehensive MRSA colonization surveillance program. It is not intended to diagnose MRSA infection nor to guide or monitor treatment for MRSA infections. Test performance is not FDA approved in patients less than 42 years old. Performed at [facility], [address], [place], [place] [address]   Culture, blood (Routine X 2) w Reflex to ID Panel     Status: None   Collection Time: 04/20/24  8:46 AM   Specimen: BLOOD RIGHT ARM  Result Value Ref Range Status   Specimen Description   Final    BLOOD RIGHT ARM Performed at [facility], [address], [place], [place] [address]    Special Requests   Final    BOTTLES DRAWN AEROBIC AND ANAEROBIC Blood Culture results may not be optimal due to an inadequate volume of blood received in culture bottles Performed at [facility], [address], [place], [place] [address]    Culture   Final    NO GROWTH 5 DAYS Performed at [facility], [address] [place], [place] [address]    Report Status 04/25/2024 FINAL  Final  Culture, blood (Routine X 2) w Reflex to ID Panel     Status: None   Collection Time: 04/20/24  8:49 AM   Specimen: BLOOD  Result Value Ref Range Status   Specimen Description   Final    BLOOD Performed at [facility], [address] [place], [place] [address]    Special Requests   Final    BOTTLES DRAWN AEROBIC AND ANAEROBIC Blood Culture results may not be optimal due to an inadequate volume of blood received in culture bottles Performed at [facility], [address] [place], [place]  [address]    Culture   Final    NO GROWTH 5 DAYS Performed at [facility], [address], [place], [place] [address]    Report Status 04/25/2024 FINAL  Final  Culture, Respiratory w Gram Stain     Status: None   Collection Time: 04/20/24 12:03 PM   Specimen: Tracheal Aspirate; Respiratory  Result Value Ref Range Status   Specimen Description   Final    TRACHEAL ASPIRATE Performed at [facility], [address], [place], [place] [address]    Special Requests   Final    NONE Performed at [facility], [address], [place], [place] [address]    Gram Stain NO WBC SEEN RARE GRAM POSITIVE COCCI   Final   Culture   Final    RARE Normal respiratory flora-no Staph aureus or Pseudomonas seen Performed at [facility], [address] [place], [place] [address]    Report Status 04/23/2024 FINAL  Final  Aerobic/Anaerobic Culture w Gram Stain (surgical/deep wound)     Status: None   Collection Time: 04/21/24  2:23 PM   Specimen: Soft Tissue, Other  Result Value Ref Range Status   Specimen Description   Final    TISSUE INTRA ABDOMINAL NECROTIC TISSUE Performed at [facility], [address], [place], [place] [address]    Special Requests   Final    NONE Performed at [facility], [address] [place], [place] [address]    Gram Stain   Final    FEW WBC PRESENT,BOTH PMN AND MONONUCLEAR RARE GRAM POSITIVE COCCI IN PAIRS AND CHAINS RARE YEAST    Culture   Final    RARE ESCHERICHIA COLI FEW CANDIDA ALBICANS FEW ENTEROCOCCUS FAECALIS FEW BACTEROIDES SPECIES BETA LACTAMASE POSITIVE Performed at [facility], [address] [place], [place] [address]    Report Status 04/25/2024 FINAL  Final   Organism ID, Bacteria ESCHERICHIA COLI  Final   Organism ID, Bacteria ENTEROCOCCUS FAECALIS  Final      Susceptibility   Escherichia coli - MIC*     AMPICILLIN >=32 RESISTANT Resistant     CEFEPIME  <=0.12 SENSITIVE Sensitive     CEFTAZIDIME <=1 SENSITIVE Sensitive     CEFTRIAXONE  1 SENSITIVE Sensitive     CIPROFLOXACIN  <=0.25 SENSITIVE Sensitive     GENTAMICIN  <=1 SENSITIVE Sensitive     IMIPENEM <=0.25 SENSITIVE Sensitive     TRIMETH /SULFA  <=20 SENSITIVE Sensitive     AMPICILLIN/SULBACTAM >=32 RESISTANT Resistant     PIP/TAZO 8 SENSITIVE Sensitive ug/mL    * RARE ESCHERICHIA COLI   Enterococcus faecalis - MIC*    AMPICILLIN <=2 SENSITIVE Sensitive     VANCOMYCIN  1 SENSITIVE Sensitive     GENTAMICIN  SYNERGY SENSITIVE Sensitive     * FEW ENTEROCOCCUS FAECALIS    Labs: Results for orders placed or performed during the hospital encounter of 04/10/24 (from the past 48 hours)  Glucose, capillary     Status: Abnormal   Collection Time: 05/07/24 11:28 AM  Result Value Ref Range   Glucose-Capillary 129 (H) 70 - 99 mg/dL    Comment: Glucose reference range applies only to samples taken after fasting for at least 8 hours.  Glucose, capillary     Status: None   Collection Time: 05/07/24  3:04 PM  Result Value Ref Range   Glucose-Capillary 99 70 - 99 mg/dL    Comment: Glucose reference range applies only to samples  taken after fasting for at least 8 hours.  Glucose, capillary     Status: Abnormal   Collection Time: 05/07/24  7:38 PM  Result Value Ref Range   Glucose-Capillary 153 (H) 70 - 99 mg/dL    Comment: Glucose reference range applies only to samples taken after fasting for at least 8 hours.  Glucose, capillary     Status: Abnormal   Collection Time: 05/07/24 11:23 PM  Result Value Ref Range   Glucose-Capillary 129 (H) 70 - 99 mg/dL    Comment: Glucose reference range applies only to samples taken after fasting for at least 8 hours.  Glucose, capillary     Status: Abnormal   Collection Time: 05/08/24  3:59 AM  Result Value Ref Range   Glucose-Capillary 135 (H) 70 - 99 mg/dL    Comment: Glucose reference range applies  only to samples taken after fasting for at least 8 hours.  Heparin  level (unfractionated)     Status: None   Collection Time: 05/08/24  6:31 AM  Result Value Ref Range   Heparin  Unfractionated 0.66 0.30 - 0.70 IU/mL    Comment: (NOTE) The clinical reportable range upper limit is being lowered to >1.10 to align with the FDA approved guidance for the current laboratory assay.  If heparin  results are below expected values, and patient dosage has  been confirmed, suggest follow up testing of antithrombin III levels. Performed at [facility], [address] [place], [place] [address]   CBC     Status: Abnormal   Collection Time: 05/08/24  6:31 AM  Result Value Ref Range   WBC 12.3 (H) 4.0 - 10.5 K/uL   RBC 3.30 (L) 4.22 - 5.81 MIL/uL   Hemoglobin 9.9 (L) 13.0 - 17.0 g/dL   HCT 68.8 (L) 60.9 - 47.9 %   MCV 94.2 80.0 - 100.0 fL   MCH 30.0 26.0 - 34.0 pg   MCHC 31.8 30.0 - 36.0 g/dL   RDW 80.9 (H) 88.4 - 84.4 %   Platelets 113 (L) 150 - 400 K/uL   nRBC 0.0 0.0 - 0.2 %    Comment: Performed at [facility], [address], [place], [place] [address]  Glucose, capillary     Status: Abnormal   Collection Time: 05/08/24  7:39 AM  Result Value Ref Range   Glucose-Capillary 105 (H) 70 - 99 mg/dL    Comment: Glucose reference range applies only to samples taken after fasting for at least 8 hours.  Basic metabolic panel     Status: Abnormal   Collection Time: 05/08/24  9:01 AM  Result Value Ref Range   Sodium 133 (L) 135 - 145 mmol/L   Potassium 4.5 3.5 - 5.1 mmol/L   Chloride 99 98 - 111 mmol/L   CO2 21 (L) 22 - 32 mmol/L   Glucose, Bld 105 (H) 70 - 99 mg/dL    Comment: Glucose reference range applies only to samples taken after fasting for at least 8 hours.   BUN 90 (H) 8 - 23 mg/dL   Creatinine, Ser 7.43 (H) 0.61 - 1.24 mg/dL   Calcium  9.4 8.9 - 10.3 mg/dL   GFR, Estimated 26 (L) >60 mL/min    Comment: (NOTE) Calculated using the CKD-EPI Creatinine Equation (2021)     Anion gap 13 5 - 15    Comment: Performed at [facility], [address], [place], [place] [address]  Glucose, capillary     Status: Abnormal  Collection Time: 05/08/24 11:25 AM  Result Value Ref Range   Glucose-Capillary 135 (H) 70 - 99 mg/dL    Comment: Glucose reference range applies only to samples taken after fasting for at least 8 hours.  Glucose, capillary     Status: Abnormal   Collection Time: 05/08/24  3:14 PM  Result Value Ref Range   Glucose-Capillary 118 (H) 70 - 99 mg/dL    Comment: Glucose reference range applies only to samples taken after fasting for at least 8 hours.  Glucose, capillary     Status: Abnormal   Collection Time: 05/08/24  7:23 PM  Result Value Ref Range   Glucose-Capillary 124 (H) 70 - 99 mg/dL    Comment: Glucose reference range applies only to samples taken after fasting for at least 8 hours.  Glucose, capillary     Status: Abnormal   Collection Time: 05/08/24 11:09 PM  Result Value Ref Range   Glucose-Capillary 172 (H) 70 - 99 mg/dL    Comment: Glucose reference range applies only to samples taken after fasting for at least 8 hours.  Glucose, capillary     Status: Abnormal   Collection Time: 05/09/24  3:36 AM  Result Value Ref Range   Glucose-Capillary 149 (H) 70 - 99 mg/dL    Comment: Glucose reference range applies only to samples taken after fasting for at least 8 hours.  Glucose, capillary     Status: None   Collection Time: 05/09/24  7:31 AM  Result Value Ref Range   Glucose-Capillary 99 70 - 99 mg/dL    Comment: Glucose reference range applies only to samples taken after fasting for at least 8 hours.    Imaging / Studies: No results found.      Medications / Allergies: per chart  Antibiotics: Anti-infectives (From admission, onward)    Start     Dose/Rate Route Frequency Ordered Stop   05/06/24 1400  piperacillin -tazobactam (ZOSYN ) IVPB 3.375 g        3.375 g 12.5 mL/hr over 240 Minutes Intravenous Every 8 hours  05/06/24 1227     05/01/24 1400  piperacillin -tazobactam (ZOSYN ) IVPB 2.25 g  Status:  Discontinued        2.25 g 100 mL/hr over 30 Minutes Intravenous Every 8 hours 05/01/24 0957 05/06/24 1227   05/01/24 1045  micafungin  (MYCAMINE ) 100 mg in sodium chloride  0.9 % 100 mL IVPB        100 mg 105 mL/hr over 1 Hours Intravenous Every 24 hours 05/01/24 0949     04/29/24 1000  micafungin  (MYCAMINE ) 150 mg in sodium chloride  0.9 % 100 mL IVPB  Status:  Discontinued        150 mg 107.5 mL/hr over 1 Hours Intravenous Every 24 hours 04/28/24 1036 05/01/24 0949   04/28/24 1345  micafungin  (MYCAMINE ) 100 mg in sodium chloride  0.9 % 100 mL IVPB        100 mg 105 mL/hr over 1 Hours Intravenous  Once 04/28/24 1245 04/28/24 1354   04/28/24 1000  micafungin  (MYCAMINE ) 100 mg in sodium chloride  0.9 % 100 mL IVPB  Status:  Discontinued        100 mg 105 mL/hr over 1 Hours Intravenous Every 24 hours 04/27/24 1219 04/28/24 1036   04/21/24 1400  clindamycin  (CLEOCIN ) 900 mg, gentamicin  (GARAMYCIN ) 240 mg in sodium chloride  0.9 % 1,000 mL for intraperitoneal lavage  Status:  Discontinued         Irrigation To Surgery 04/21/24 1346 04/21/24 1618  04/20/24 1400  piperacillin -tazobactam (ZOSYN ) IVPB 3.375 g  Status:  Discontinued        3.375 g 12.5 mL/hr over 240 Minutes Intravenous Every 8 hours 04/20/24 0755 05/01/24 0957   04/20/24 1000  metroNIDAZOLE  (FLAGYL ) IVPB 500 mg  Status:  Discontinued        500 mg 100 mL/hr over 60 Minutes Intravenous Every 12 hours 04/20/24 0320 04/20/24 0752   04/20/24 1000  micafungin  (MYCAMINE ) 150 mg in sodium chloride  0.9 % 100 mL IVPB  Status:  Discontinued        150 mg 107.5 mL/hr over 1 Hours Intravenous Every 24 hours 04/20/24 0752 04/27/24 1219   04/20/24 0245  ceFEPIme  (MAXIPIME ) 2 g in sodium chloride  0.9 % 100 mL IVPB  Status:  Discontinued        2 g 200 mL/hr over 30 Minutes Intravenous Every 8 hours 04/20/24 0232 04/20/24 0752   04/20/24 0100  metroNIDAZOLE   (FLAGYL ) IVPB 500 mg        500 mg 100 mL/hr over 60 Minutes Intravenous On call to O.R. 04/20/24 0004 04/20/24 0100   04/14/24 1800  ceFEPIme  (MAXIPIME ) 2 g in sodium chloride  0.9 % 100 mL IVPB        2 g 200 mL/hr over 30 Minutes Intravenous Every 8 hours 04/14/24 1003 04/19/24 0957   04/14/24 1600  vancomycin  (VANCOCIN ) IVPB 1000 mg/200 mL premix  Status:  Discontinued        1,000 mg 200 mL/hr over 60 Minutes Intravenous Every 24 hours 04/13/24 1420 04/14/24 1000   04/14/24 1600  vancomycin  (VANCOREADY) IVPB 1500 mg/300 mL  Status:  Discontinued        1,500 mg 150 mL/hr over 120 Minutes Intravenous Every 24 hours 04/14/24 1002 04/16/24 0744   04/14/24 1200  vancomycin  (VANCOCIN ) IVPB 1000 mg/200 mL premix  Status:  Discontinued        1,000 mg 200 mL/hr over 60 Minutes Intravenous Every 24 hours 04/13/24 1053 04/13/24 1109   04/13/24 2200  ceFEPIme  (MAXIPIME ) 2 g in sodium chloride  0.9 % 100 mL IVPB  Status:  Discontinued        2 g 200 mL/hr over 30 Minutes Intravenous Every 12 hours 04/13/24 1036 04/13/24 1109   04/13/24 2200  ceFEPIme  (MAXIPIME ) 2 g in sodium chloride  0.9 % 100 mL IVPB  Status:  Discontinued        2 g 200 mL/hr over 30 Minutes Intravenous Every 12 hours 04/13/24 1425 04/14/24 1003   04/13/24 1500  vancomycin  (VANCOCIN ) 2,000 mg in sodium chloride  0.9 % 500 mL IVPB        2,000 mg 260 mL/hr over 120 Minutes Intravenous  Once 04/13/24 1409 04/13/24 1850   04/13/24 1500  ceFEPIme  (MAXIPIME ) 2 g in sodium chloride  0.9 % 100 mL IVPB  Status:  Discontinued        2 g 200 mL/hr over 30 Minutes Intravenous Every 12 hours 04/13/24 1420 04/13/24 1425   04/13/24 1500  metroNIDAZOLE  (FLAGYL ) IVPB 500 mg  Status:  Discontinued        500 mg 100 mL/hr over 60 Minutes Intravenous Every 12 hours 04/13/24 1420 04/17/24 0725   04/13/24 1200  vancomycin  (VANCOCIN ) 2,000 mg in sodium chloride  0.9 % 500 mL IVPB  Status:  Discontinued        2,000 mg 260 mL/hr over 120 Minutes  Intravenous  Once 04/13/24 1052 04/13/24 1121   04/13/24 1100  metroNIDAZOLE  (FLAGYL ) IVPB 500 mg  Status:  Discontinued        500 mg 100 mL/hr over 60 Minutes Intravenous 2 times daily 04/13/24 1007 04/13/24 1109   04/13/24 1100  vancomycin  (VANCOCIN ) IVPB 1000 mg/200 mL premix  Status:  Discontinued        1,000 mg 200 mL/hr over 60 Minutes Intravenous  Once 04/13/24 1007 04/13/24 1020   04/13/24 1015  ceFEPIme  (MAXIPIME ) 2 g in sodium chloride  0.9 % 100 mL IVPB        2 g 200 mL/hr over 30 Minutes Intravenous STAT 04/13/24 1007 04/14/24 1721   04/10/24 2200  ceFAZolin  (ANCEF ) IVPB 2g/100 mL premix        2 g 200 mL/hr over 30 Minutes Intravenous Every 8 hours 04/10/24 1833 04/11/24 0531   04/10/24 0600  ceFAZolin  (ANCEF ) IVPB 2g/100 mL premix        2 g 200 mL/hr over 30 Minutes Intravenous On call to O.R. 04/10/24 0533 04/10/24 1526         Burnard JONELLE Louder, [facility] Surgery 05/09/2024, 9:33 AM Please see Amion for pager number during day hours 7:00am-4:30pm      05/09/2024  9:23 AM

## 2024-05-09 NOTE — Progress Notes (Signed)
 PHARMACY - ANTICOAGULATION CONSULT NOTE  Pharmacy Consult for Heparin  Indication: h/o PE  Allergies  Allergen Reactions   Demerol  [Meperidine ] Nausea And Vomiting and Other (See Comments)    SEVERE NAUSEA    Patient Measurements: Height: 5' 11 (180.3 cm) Weight: 99.3 kg (218 lb 14.7 oz) IBW/kg (Calculated) : 75.3 HEPARIN  DW (KG): 100.8  Vital Signs: Temp: 97.9 F (36.6 C) (06/28 1132) Temp Source: Axillary (06/28 1132) BP: 121/73 (06/28 1132) Pulse Rate: 96 (06/28 1132)  Labs: Recent Labs    05/07/24 0415 05/07/24 0526 05/08/24 0631 05/08/24 0901 05/09/24 0830  HGB  --  9.1* 9.9*  --   --   HCT  --  28.2* 31.1*  --   --   PLT  --  92* 113*  --   --   HEPARINUNFRC 0.53  --  0.66  --   --   CREATININE 1.94*  --   --  2.56* 2.84*    Estimated Creatinine Clearance: 28.6 mL/min (A) (by C-G formula based on SCr of 2.84 mg/dL (H)).  Assessment:  Patient with prolonged hospitalization including post-op complications resulting in bowel perforation with open abdomen. Has been closed and transferred to [facility]. On Eliquis PTA for hx of PE. Has been on heparin  gtt since 6/6.   Heparin  level therapeutic; CBC stable/improving, no bleeding reported.  Goal of Therapy:  Heparin  level 0.3-0.7 units/ml Monitor platelets by anticoagulation protocol: Yes   Plan:  Continue heparin  gtt at 1900 units/hr Daily heparin  level and CBC daily Monitor for s/sx of bleeding.  Continue to hold DOAC because patient might still need a G-tube  Miabella Shannahan D. Lendell, PharmD, BCPS, BCCCP 05/09/2024, 3:10 PM

## 2024-05-09 NOTE — Progress Notes (Signed)
 Pt has home CPAP, helped set it up and water  given to pt's wife

## 2024-05-09 NOTE — Progress Notes (Signed)
 PROGRESS NOTE  Kristopher Thompson FMW:[id number] DOB: 1951/07/26   PCP: [name], MD  Patient is from: Home  DOA: 04/10/2024 LOS: 29  Chief complaints No chief complaint on file.    Brief Narrative / Interim history: 73 year old M with PMH of prostate cancer, multiple abdominal surgeries, DM-2, PE on Eliquis, HFpEF, obesity and anemia initially presented to the hospital on 5/30 for robotic/laparoscopic surgery for incarcerated abdominal wall hernias requiring extensive lysis, mesh insertion, repair, urostomy ileal conduit revision thereafter admitted to the ICU due to tachycardia and tachypnea.  Initially placed on norepinephrine  and an arterial line was placed.  Patient declined NG tube placement but eventually agreed.  Patient was taken back to the OR on 6/9 due to perforated bowel and postop shock.  Required multiple pressors, steroids and PRBC transfusion after washout/closure in the OR.  On 6/11 developed AKI requiring dialysis..  Patient was extubated on 6/14.   Significant events/procedures 5/30-laparoscopic surgery 6/2 CCM consult and vasopressor 6/4 off NE. Pulled out his NGT refused replacement. After multiple emesis  6/5 NG tube replaced. 6/6 transferred to MedSurg. 6/9: OR for perforated bowel, post-op shock 6/10: NE, Phenylephrine , vasopressin , steroids, 1u PRBCs, back to OR for washout and closure 6/11: Worsening AKI with fluid overload, start CRRT, decreasing pressor requirements 6/14: Off CRRT, arousable.  Extubated. 6/15 transferred to [facility] for iHD; montoring for renal recovery 6/16 iHD, severely encephalopathic with high BUN 6/17 restarted CRRT for to ongoing uremia and encephalopathy 6/18 was awake enough to work with PT, OT 6/23 bilateral PCN placed by urology. 6/24 MS imrpoving, Neph tubes in and draining  Subjective: Seen and examined earlier this morning.  No major events overnight or this morning.  Had shortness of breath and chest pain earlier that has  resolved.  Reports nausea after eating.  Patient's wife at bedside.  Objective: Vitals:   05/08/24 2315 05/09/24 0335 05/09/24 0734 05/09/24 1132  BP: 121/74 133/80 109/69 121/73  Pulse: (!) 107 98 97 96  Resp: 17 (!) 23 18 15   Temp: 97.7 F (36.5 C) 98.1 F (36.7 C) 98.2 F (36.8 C) 97.9 F (36.6 C)  TempSrc: Oral Axillary Oral Axillary  SpO2: 97% 96% 97% 97%  Weight:      Height:        Examination:  GENERAL: No apparent distress.  Nontoxic. HEENT: MMM.  Vision and hearing grossly intact.  NECK: Supple.  No apparent JVD.  RESP:  No IWOB.  Fair aeration bilaterally. CVS:  RRR. Heart sounds normal.  ABD/GI/GU: BS+. Abd soft.  Wound VAC, colostomy, urostomy, JP drain to RUQ (serous output) and PCN (serosanguineous) MSK/EXT:  Moves extremities. No apparent deformity. No edema.  SKIN: As above. NEURO: AA.  Oriented appropriately.  No apparent focal neuro deficit. PSYCH: Calm. Normal affect.    Microbiology summarized: 6/2-MRSA PCR screen nonreactive 6/2-blood cultures NGTD 6/9-blood culture NGTD 6/10-intra-abdominal tissue culture with E. coli, Enterococcus faecalis, bacteroids, Candida albicans  Assessment and plan: Robotic hernia repair-incisional, parastomal, left inguinal on 5/30 Septic shock due to acute peritonitis caused by bowel perforation- -S/p ileal resection and end ileostomy creation 6/9, and closure on 6/10 -Wound VAC and RUQ JP drain in place. -Surgical culture as above. -On IV Zosyn  and micafungin  per ID. -On regular diet supplemented by tube feed due to poor p.o. intake -Surgery service remains primary.  Postop management per their team.   Uremic and septic encephalopathy and delirium: Improving. -Treat treatable causes. -Reorientation and delirium precaution   AKI secondary to  ischemic ATN/septic shock/bilateral hydronephrosis: Reports nausea after eating which could be from uremia. Recent Labs    04/30/24 1611 05/01/24 0417 05/02/24 0515  05/03/24 0453 05/04/24 0341 05/05/24 0425 05/06/24 0500 05/07/24 0415 05/08/24 0901 05/09/24 0830  BUN 49* 38* 69* 96* 109* 72* 78* 74* 90* 89*  CREATININE 1.58* 1.58* 2.96* 3.60* 3.86* 2.53* 2.27* 1.94* 2.56* 2.84*  -Needed CRRT and HD.  Last HD on 6/23.  HD cath removed.  -S/p bilateral PCN by urology.  Also has urostomy. -Continue Phos binder. -Nephrology and urology on board.  Acute blood loss anemia/right flank hematoma superimposed on anemia of chronic disease: Required transfusion at some point.  H&H stable. Recent Labs    04/27/24 0453 04/28/24 0441 04/29/24 0355 04/30/24 0330 05/01/24 0417 05/01/24 1313 05/04/24 0341 05/06/24 0500 05/07/24 0526 05/08/24 0631  HGB 8.8* 8.7* 8.4* 8.4* 7.9* 8.2* 9.1* 8.7* 9.1* 9.9*  - Continue monitoring.  History of PE on Eliquis at home - Currently on heparin  drip  Chronic HFpEF/essential hypertension: Appears euvolemic on exam.  Not on diuretics. - Monitor fluid and respiratory status  Diabetes type 2: A1c 5.3%.  On metformin at home. Recent Labs  Lab 05/08/24 1923 05/08/24 2309 05/09/24 0336 05/09/24 0731 05/09/24 1131  GLUCAP 124* 172* 149* 99 133*  - Continue Semglee  25 units daily - Continue NovoLog  5 units 3 times daily with meals - Continue SSI-resistant    Physical deconditioning -PT/OT- SNF  Class I obesity/increased nutrient needs Body mass index is 30.53 kg/m. Nutrition Problem: Increased nutrient needs Etiology: acute illness Signs/Symptoms: estimated needs Interventions: Tube feeding, Prostat, Juven   DVT prophylaxis:  Place TED hose Start: 04/28/24 0818 SCD's Start: 04/10/24 1834  Code Status: Full code Family Communication: Updated patient's wife at bedside Level of care: Progressive Status is: Inpatient   Final disposition: Per primary   55 minutes with more than 50% spent in reviewing records, counseling patient/family and coordinating care.   Sch Meds:  Scheduled Meds:   Chlorhexidine  Gluconate Cloth  6 each Topical Daily   Chlorhexidine  Gluconate Cloth  6 each Topical Q0600   vitamin B-12  1,000 mcg Oral Daily   darbepoetin (ARANESP ) injection - DIALYSIS  200 mcg Subcutaneous Q Fri-1800   feeding supplement  237 mL Oral BID BM   feeding supplement (PROSource TF20)  60 mL Per Tube BID   feeding supplement (VITAL 1.5 CAL)  1,440 mL Per Tube Q24H   insulin  aspart  0-20 Units Subcutaneous Q4H   insulin  aspart  6 Units Subcutaneous 3 times per day   insulin  glargine-yfgn  25 Units Subcutaneous QHS   loperamide  HCl  2 mg Oral QHS   multivitamin with minerals  1 tablet Oral Daily   nutrition supplement (JUVEN)  1 packet Oral BID BM   mouth rinse  15 mL Mouth Rinse 4 times per day   polycarbophil  625 mg Oral BID   prochlorperazine   10 mg Intravenous Once   rosuvastatin   10 mg Oral QPM   sevelamer  carbonate  800 mg Oral TID WC   sodium chloride  flush  5 mL Intracatheter Q8H   Continuous Infusions:  sodium chloride  Stopped (05/01/24 0014)   albumin  human     anticoagulant sodium citrate      heparin  1,900 Units/hr (05/09/24 0455)   micafungin  (MYCAMINE ) 100 mg in sodium chloride  0.9 % 100 mL IVPB 100 mg (05/09/24 1055)   piperacillin -tazobactam (ZOSYN )  IV 3.375 g (05/09/24 0611)   PRN Meds:.sodium chloride , acetaminophen , acetaminophen , albumin  human,  alteplase , anticoagulant sodium citrate , artificial tears, diphenhydrAMINE , fentaNYL  (SUBLIMAZE ) injection, heparin , hydrALAZINE , ipratropium, lidocaine  (PF), lidocaine -prilocaine , magic mouthwash, menthol -cetylpyridinium, naphazoline-glycerin , ondansetron  (ZOFRAN ) IV, oxyCODONE , pentafluoroprop-tetrafluoroeth, phenol, sodium chloride , sodium chloride  flush, white petrolatum   Antimicrobials: Anti-infectives (From admission, onward)    Start     Dose/Rate Route Frequency Ordered Stop   05/06/24 1400  piperacillin -tazobactam (ZOSYN ) IVPB 3.375 g        3.375 g 12.5 mL/hr over 240 Minutes Intravenous Every 8  hours 05/06/24 1227     05/01/24 1400  piperacillin -tazobactam (ZOSYN ) IVPB 2.25 g  Status:  Discontinued        2.25 g 100 mL/hr over 30 Minutes Intravenous Every 8 hours 05/01/24 0957 05/06/24 1227   05/01/24 1045  micafungin  (MYCAMINE ) 100 mg in sodium chloride  0.9 % 100 mL IVPB        100 mg 105 mL/hr over 1 Hours Intravenous Every 24 hours 05/01/24 0949     04/29/24 1000  micafungin  (MYCAMINE ) 150 mg in sodium chloride  0.9 % 100 mL IVPB  Status:  Discontinued        150 mg 107.5 mL/hr over 1 Hours Intravenous Every 24 hours 04/28/24 1036 05/01/24 0949   04/28/24 1345  micafungin  (MYCAMINE ) 100 mg in sodium chloride  0.9 % 100 mL IVPB        100 mg 105 mL/hr over 1 Hours Intravenous  Once 04/28/24 1245 04/28/24 1354   04/28/24 1000  micafungin  (MYCAMINE ) 100 mg in sodium chloride  0.9 % 100 mL IVPB  Status:  Discontinued        100 mg 105 mL/hr over 1 Hours Intravenous Every 24 hours 04/27/24 1219 04/28/24 1036   04/21/24 1400  clindamycin  (CLEOCIN ) 900 mg, gentamicin  (GARAMYCIN ) 240 mg in sodium chloride  0.9 % 1,000 mL for intraperitoneal lavage  Status:  Discontinued         Irrigation To Surgery 04/21/24 1346 04/21/24 1618   04/20/24 1400  piperacillin -tazobactam (ZOSYN ) IVPB 3.375 g  Status:  Discontinued        3.375 g 12.5 mL/hr over 240 Minutes Intravenous Every 8 hours 04/20/24 0755 05/01/24 0957   04/20/24 1000  metroNIDAZOLE  (FLAGYL ) IVPB 500 mg  Status:  Discontinued        500 mg 100 mL/hr over 60 Minutes Intravenous Every 12 hours 04/20/24 0320 04/20/24 0752   04/20/24 1000  micafungin  (MYCAMINE ) 150 mg in sodium chloride  0.9 % 100 mL IVPB  Status:  Discontinued        150 mg 107.5 mL/hr over 1 Hours Intravenous Every 24 hours 04/20/24 0752 04/27/24 1219   04/20/24 0245  ceFEPIme  (MAXIPIME ) 2 g in sodium chloride  0.9 % 100 mL IVPB  Status:  Discontinued        2 g 200 mL/hr over 30 Minutes Intravenous Every 8 hours 04/20/24 0232 04/20/24 0752   04/20/24 0100   metroNIDAZOLE  (FLAGYL ) IVPB 500 mg        500 mg 100 mL/hr over 60 Minutes Intravenous On call to O.R. 04/20/24 0004 04/20/24 0100   04/14/24 1800  ceFEPIme  (MAXIPIME ) 2 g in sodium chloride  0.9 % 100 mL IVPB        2 g 200 mL/hr over 30 Minutes Intravenous Every 8 hours 04/14/24 1003 04/19/24 0957   04/14/24 1600  vancomycin  (VANCOCIN ) IVPB 1000 mg/200 mL premix  Status:  Discontinued        1,000 mg 200 mL/hr over 60 Minutes Intravenous Every 24 hours 04/13/24 1420 04/14/24 1000   04/14/24 1600  vancomycin  (VANCOREADY) IVPB 1500 mg/300 mL  Status:  Discontinued        1,500 mg 150 mL/hr over 120 Minutes Intravenous Every 24 hours 04/14/24 1002 04/16/24 0744   04/14/24 1200  vancomycin  (VANCOCIN ) IVPB 1000 mg/200 mL premix  Status:  Discontinued        1,000 mg 200 mL/hr over 60 Minutes Intravenous Every 24 hours 04/13/24 1053 04/13/24 1109   04/13/24 2200  ceFEPIme  (MAXIPIME ) 2 g in sodium chloride  0.9 % 100 mL IVPB  Status:  Discontinued        2 g 200 mL/hr over 30 Minutes Intravenous Every 12 hours 04/13/24 1036 04/13/24 1109   04/13/24 2200  ceFEPIme  (MAXIPIME ) 2 g in sodium chloride  0.9 % 100 mL IVPB  Status:  Discontinued        2 g 200 mL/hr over 30 Minutes Intravenous Every 12 hours 04/13/24 1425 04/14/24 1003   04/13/24 1500  vancomycin  (VANCOCIN ) 2,000 mg in sodium chloride  0.9 % 500 mL IVPB        2,000 mg 260 mL/hr over 120 Minutes Intravenous  Once 04/13/24 1409 04/13/24 1850   04/13/24 1500  ceFEPIme  (MAXIPIME ) 2 g in sodium chloride  0.9 % 100 mL IVPB  Status:  Discontinued        2 g 200 mL/hr over 30 Minutes Intravenous Every 12 hours 04/13/24 1420 04/13/24 1425   04/13/24 1500  metroNIDAZOLE  (FLAGYL ) IVPB 500 mg  Status:  Discontinued        500 mg 100 mL/hr over 60 Minutes Intravenous Every 12 hours 04/13/24 1420 04/17/24 0725   04/13/24 1200  vancomycin  (VANCOCIN ) 2,000 mg in sodium chloride  0.9 % 500 mL IVPB  Status:  Discontinued        2,000 mg 260 mL/hr over  120 Minutes Intravenous  Once 04/13/24 1052 04/13/24 1121   04/13/24 1100  metroNIDAZOLE  (FLAGYL ) IVPB 500 mg  Status:  Discontinued        500 mg 100 mL/hr over 60 Minutes Intravenous 2 times daily 04/13/24 1007 04/13/24 1109   04/13/24 1100  vancomycin  (VANCOCIN ) IVPB 1000 mg/200 mL premix  Status:  Discontinued        1,000 mg 200 mL/hr over 60 Minutes Intravenous  Once 04/13/24 1007 04/13/24 1020   04/13/24 1015  ceFEPIme  (MAXIPIME ) 2 g in sodium chloride  0.9 % 100 mL IVPB        2 g 200 mL/hr over 30 Minutes Intravenous STAT 04/13/24 1007 04/14/24 1721   04/10/24 2200  ceFAZolin  (ANCEF ) IVPB 2g/100 mL premix        2 g 200 mL/hr over 30 Minutes Intravenous Every 8 hours 04/10/24 1833 04/11/24 0531   04/10/24 0600  ceFAZolin  (ANCEF ) IVPB 2g/100 mL premix        2 g 200 mL/hr over 30 Minutes Intravenous On call to O.R. 04/10/24 0533 04/10/24 1526        I have personally reviewed the following labs and images: CBC: Recent Labs  Lab 05/04/24 0341 05/06/24 0500 05/07/24 0526 05/08/24 0631  WBC 12.4* 11.6* 10.8* 12.3*  HGB 9.1* 8.7* 9.1* 9.9*  HCT 28.0* 27.2* 28.2* 31.1*  MCV 90.9 91.9 91.3 94.2  PLT 136* 93* 92* 113*   BMP &GFR Recent Labs  Lab 05/03/24 0453 05/04/24 0341 05/05/24 0425 05/06/24 0500 05/07/24 0415 05/08/24 0901 05/09/24 0830  NA 132* 133* 134* 132* 135 133* 132*  K 5.4* 5.0 4.1 3.7 4.2 4.5 4.6  CL 101 99 97* 96* 103 99  98  CO2 20* 21* 26 24 21* 21* 20*  GLUCOSE 170* 166* 184* 154* 143* 105* 139*  BUN 96* 109* 72* 78* 74* 90* 89*  CREATININE 3.60* 3.86* 2.53* 2.27* 1.94* 2.56* 2.84*  CALCIUM  8.4* 9.2 8.9 8.6* 9.0 9.4 9.2  MG 2.1 2.0 2.0 1.8 1.9  --   --   PHOS 6.5* 7.2* 5.2* 3.9 3.4  --  4.7*   Estimated Creatinine Clearance: 28.6 mL/min (A) (by C-G formula based on SCr of 2.84 mg/dL (H)). Liver & Pancreas: Recent Labs  Lab 05/04/24 0341 05/05/24 0425 05/06/24 0500 05/07/24 0415 05/09/24 0830  ALBUMIN  2.4* 2.5* 2.5* 2.6* 2.6*   No  results for input(s): LIPASE, AMYLASE in the last 168 hours. No results for input(s): AMMONIA in the last 168 hours. Diabetic: No results for input(s): HGBA1C in the last 72 hours. Recent Labs  Lab 05/08/24 1923 05/08/24 2309 05/09/24 0336 05/09/24 0731 05/09/24 1131  GLUCAP 124* 172* 149* 99 133*   Cardiac Enzymes: No results for input(s): CKTOTAL, CKMB, CKMBINDEX, TROPONINI in the last 168 hours. No results for input(s): PROBNP in the last 8760 hours. Coagulation Profile: Recent Labs  Lab 05/04/24 1020  INR 1.1   Thyroid  Function Tests: No results for input(s): TSH, T4TOTAL, FREET4, T3FREE, THYROIDAB in the last 72 hours. Lipid Profile: No results for input(s): CHOL, HDL, LDLCALC, TRIG, CHOLHDL, LDLDIRECT in the last 72 hours. Anemia Panel: No results for input(s): VITAMINB12, FOLATE, FERRITIN, TIBC, IRON , RETICCTPCT in the last 72 hours. Urine analysis:    Component Value Date/Time   COLORURINE YELLOW 04/13/2024 1018   APPEARANCEUR CLOUDY (A) 04/13/2024 1018   LABSPEC 1.016 04/13/2024 1018   PHURINE 6.0 04/13/2024 1018   GLUCOSEU NEGATIVE 04/13/2024 1018   HGBUR LARGE (A) 04/13/2024 1018   BILIRUBINUR NEGATIVE 04/13/2024 1018   KETONESUR 5 (A) 04/13/2024 1018   PROTEINUR 100 (A) 04/13/2024 1018   NITRITE NEGATIVE 04/13/2024 1018   LEUKOCYTESUR LARGE (A) 04/13/2024 1018   Sepsis Labs: Invalid input(s): PROCALCITONIN, LACTICIDVEN  Microbiology: No results found for this or any previous visit (from the past 240 hours).  Radiology Studies: DG CHEST PORT 1 VIEW Result Date: 05/09/2024 CLINICAL DATA:  10026 Shortness of breath 10026 EXAM: PORTABLE CHEST - 1 VIEW COMPARISON:  04/22/2024 FINDINGS: Interval extubation. Feeding tube placement at least as far as the stomach, tip not seen. Stable right arm PICC. Interval removal of left IJ dialysis catheter. Lungs clear. Heart size and mediastinal contours are within  normal limits. No effusion. Visualized bones unremarkable. IMPRESSION: Interval extubation. No acute cardiopulmonary disease. Electronically Signed   By: JONETTA Faes M.D.   On: 05/09/2024 11:53      Teila Skalsky T. Kessa Fairbairn [facility]  If 7PM-7AM, please contact night-coverage [email or web address] 05/09/2024, 12:20 PM

## 2024-05-10 DIAGNOSIS — C679 Malignant neoplasm of bladder, unspecified: Secondary | ICD-10-CM | POA: Diagnosis not present

## 2024-05-10 DIAGNOSIS — A419 Sepsis, unspecified organism: Secondary | ICD-10-CM | POA: Diagnosis not present

## 2024-05-10 DIAGNOSIS — R000 Tachycardia, unspecified: Secondary | ICD-10-CM | POA: Diagnosis not present

## 2024-05-10 DIAGNOSIS — K631 Perforation of intestine (nontraumatic): Secondary | ICD-10-CM | POA: Diagnosis not present

## 2024-05-10 LAB — CBC
HCT: 32.8 % — ABNORMAL LOW (ref 39.0–52.0)
Hemoglobin: 10.5 g/dL — ABNORMAL LOW (ref 13.0–17.0)
MCH: 30.2 pg (ref 26.0–34.0)
MCHC: 32 g/dL (ref 30.0–36.0)
MCV: 94.3 fL (ref 80.0–100.0)
Platelets: 131 10*3/uL — ABNORMAL LOW (ref 150–400)
RBC: 3.48 MIL/uL — ABNORMAL LOW (ref 4.22–5.81)
RDW: 19.1 % — ABNORMAL HIGH (ref 11.5–15.5)
WBC: 11.3 10*3/uL — ABNORMAL HIGH (ref 4.0–10.5)
nRBC: 0 % (ref 0.0–0.2)

## 2024-05-10 LAB — GLUCOSE, CAPILLARY
Glucose-Capillary: 155 mg/dL — ABNORMAL HIGH (ref 70–99)
Glucose-Capillary: 87 mg/dL (ref 70–99)
Glucose-Capillary: 125 mg/dL — ABNORMAL HIGH (ref 70–99)
Glucose-Capillary: 144 mg/dL — ABNORMAL HIGH (ref 70–99)
Glucose-Capillary: 145 mg/dL — ABNORMAL HIGH (ref 70–99)
Glucose-Capillary: 162 mg/dL — ABNORMAL HIGH (ref 70–99)

## 2024-05-10 LAB — RENAL FUNCTION PANEL
Albumin: 2.9 g/dL — ABNORMAL LOW (ref 3.5–5.0)
Anion gap: 14 (ref 5–15)
BUN: 89 mg/dL — ABNORMAL HIGH (ref 8–23)
CO2: 18 mmol/L — ABNORMAL LOW (ref 22–32)
Calcium: 9.7 mg/dL (ref 8.9–10.3)
Chloride: 100 mmol/L (ref 98–111)
Creatinine, Ser: 2.29 mg/dL — ABNORMAL HIGH (ref 0.61–1.24)
GFR, Estimated: 30 mL/min — ABNORMAL LOW (ref >60)
Glucose, Bld: 100 mg/dL — ABNORMAL HIGH (ref 70–99)
Phosphorus: 4.7 mg/dL — ABNORMAL HIGH (ref 2.5–4.6)
Potassium: 4.8 mmol/L (ref 3.5–5.1)
Sodium: 132 mmol/L — ABNORMAL LOW (ref 135–145)

## 2024-05-10 LAB — HEPARIN LEVEL (UNFRACTIONATED): Heparin Unfractionated: 0.52 [IU]/mL (ref 0.30–0.70)

## 2024-05-10 NOTE — Progress Notes (Signed)
 PROGRESS NOTE  Kristopher Thompson FMW:[id number] DOB: January 06, 1951   PCP: [name], MD  Patient is from: Home  DOA: 04/10/2024 LOS: 30  Chief complaints No chief complaint on file.    Brief Narrative / Interim history: 73 year old M with PMH of prostate cancer, multiple abdominal surgeries, DM-2, PE on Eliquis, HFpEF, obesity and anemia initially presented to the hospital on 5/30 for robotic/laparoscopic surgery for incarcerated abdominal wall hernias requiring extensive lysis, mesh insertion, repair, urostomy ileal conduit revision thereafter admitted to the ICU due to tachycardia and tachypnea.  Initially placed on norepinephrine  and an arterial line was placed.  Patient declined NG tube placement but eventually agreed.  Patient was taken back to the OR on 6/9 due to perforated bowel and postop shock.  Required multiple pressors, steroids and PRBC transfusion after washout/closure in the OR.  On 6/11 developed AKI requiring dialysis..  Patient was extubated on 6/14.   Significant events/procedures 5/30-laparoscopic surgery 6/2 CCM consult and vasopressor 6/4 off NE. Pulled out his NGT refused replacement. After multiple emesis  6/5 NG tube replaced. 6/6 transferred to MedSurg. 6/9: OR for perforated bowel, post-op shock 6/10: NE, Phenylephrine , vasopressin , steroids, 1u PRBCs, back to OR for washout and closure 6/11: Worsening AKI with fluid overload, start CRRT, decreasing pressor requirements 6/14: Off CRRT, arousable.  Extubated. 6/15 transferred to [facility] for iHD; montoring for renal recovery 6/16 iHD, severely encephalopathic with high BUN 6/17 restarted CRRT for to ongoing uremia and encephalopathy 6/18 was awake enough to work with PT, OT 6/23 bilateral PCN placed by urology. 6/24 MS imrpoving, Neph tubes in and draining  Subjective: Seen and examined earlier this morning.  No major events overnight or this morning.  Continues to endorse nausea.  No emesis.  Drain output  3.5 L but this seems to include output from bilateral PCN.  Objective: Vitals:   05/09/24 2100 05/09/24 2341 05/10/24 0358 05/10/24 0741  BP:   138/79 119/63  Pulse: (!) 108 (!) 109 (!) 102 99  Resp: 13 19 18 14   Temp:  (!) 96.4 F (35.8 C) 97.9 F (36.6 C) 97.9 F (36.6 C)  TempSrc:  Axillary Axillary Axillary  SpO2: 96% 95% 96% 97%  Weight:      Height:        Examination:  GENERAL: No apparent distress.  Nontoxic. HEENT: MMM.  Vision and hearing grossly intact.  NECK: Supple.  No apparent JVD.  RESP:  No IWOB.  Fair aeration bilaterally. CVS:  RRR. Heart sounds normal.  ABD/GI/GU: BS+. Abd soft.  Wound VAC, colostomy, urostomy (with minimal urine), JP drain to RUQ (serous output) and bilateral PCN with clear looking urine MSK/EXT:  Moves extremities. No apparent deformity. No edema.  SKIN: As above. NEURO: AA.  Oriented appropriately.  No apparent focal neuro deficit. PSYCH: Calm. Normal affect.    Microbiology summarized: 6/2-MRSA PCR screen nonreactive 6/2-blood cultures NGTD 6/9-blood culture NGTD 6/10-intra-abdominal tissue culture with E. coli, Enterococcus faecalis, bacteroids, Candida albicans  Assessment and plan: Robotic hernia repair-incisional, parastomal, left inguinal on 5/30 Septic shock due to acute peritonitis caused by bowel perforation- -S/p ileal resection and end ileostomy creation 6/9, and closure on 6/10 -Wound VAC and RUQ JP drain in place. -Surgical culture as above. -On IV Zosyn  and micafungin  per ID. -On regular diet supplemented by tube feed due to poor p.o. intake -Surgery service remains primary.  Postop management per their team.   Uremic and septic encephalopathy and delirium: Improving. -Treat treatable causes. -Reorientation and delirium  precaution   AKI secondary to ischemic ATN/septic shock/bilateral hydronephrosis: Reports nausea after eating which could be from uremia.  Renal function improving. Recent Labs     05/01/24 0417 05/02/24 0515 05/03/24 0453 05/04/24 0341 05/05/24 0425 05/06/24 0500 05/07/24 0415 05/08/24 0901 05/09/24 0830 05/10/24 0600  BUN 38* 69* 96* 109* 72* 78* 74* 90* 89* 89*  CREATININE 1.58* 2.96* 3.60* 3.86* 2.53* 2.27* 1.94* 2.56* 2.84* 2.29*  -Needed CRRT and HD.  Last HD on 6/23.  HD cath removed.  -S/p bilateral PCN by urology.  Also has urostomy. -Continue Phos binder. -Nephrology and urology on board.  Acute blood loss anemia/right flank hematoma superimposed on anemia of chronic disease: Required transfusion at some point.  H&H stable. Recent Labs    04/29/24 0355 04/30/24 0330 05/01/24 0417 05/01/24 1313 05/04/24 0341 05/06/24 0500 05/07/24 0526 05/08/24 0631 05/09/24 1351 05/10/24 0600  HGB 8.4* 8.4* 7.9* 8.2* 9.1* 8.7* 9.1* 9.9* 10.2* 10.5*  - Continue monitoring.  History of PE on Eliquis at home - Currently on heparin  drip  Chronic HFpEF/essential hypertension: Appears euvolemic on exam.  Not on diuretics. - Monitor fluid and respiratory status  Diabetes type 2: A1c 5.3%.  On metformin at home. Recent Labs  Lab 05/09/24 1615 05/09/24 1949 05/09/24 2338 05/10/24 0437 05/10/24 0739  GLUCAP 118* 155* 137* 155* 87  - Continue Semglee  25 units daily - Discontinue meal coverage given poor p.o. intake. - Continue SSI-resistant every 4 hours.    Physical deconditioning -PT/OT- SNF  Class I obesity/increased nutrient needs Body mass index is 30.53 kg/m. Nutrition Problem: Increased nutrient needs Etiology: acute illness Signs/Symptoms: estimated needs Interventions: Tube feeding, Prostat, Juven   DVT prophylaxis:  Place TED hose Start: 04/28/24 0818 SCD's Start: 04/10/24 1834  Code Status: Full code Family Communication: Updated patient's wife at bedside Level of care: Progressive Status is: Inpatient   Final disposition: Per primary   55 minutes with more than 50% spent in reviewing records, counseling patient/family and  coordinating care.   Sch Meds:  Scheduled Meds:  Chlorhexidine  Gluconate Cloth  6 each Topical Daily   Chlorhexidine  Gluconate Cloth  6 each Topical Q0600   vitamin B-12  1,000 mcg Oral Daily   darbepoetin (ARANESP ) injection - DIALYSIS  200 mcg Subcutaneous Q Fri-1800   feeding supplement  237 mL Oral BID BM   feeding supplement (PROSource TF20)  60 mL Per Tube BID   feeding supplement (VITAL 1.5 CAL)  1,440 mL Per Tube Q24H   insulin  aspart  0-20 Units Subcutaneous Q4H   insulin  aspart  6 Units Subcutaneous 3 times per day   insulin  glargine-yfgn  25 Units Subcutaneous QHS   loperamide  HCl  2 mg Oral QHS   multivitamin with minerals  1 tablet Oral Daily   nutrition supplement (JUVEN)  1 packet Oral BID BM   mouth rinse  15 mL Mouth Rinse 4 times per day   polycarbophil  625 mg Oral BID   prochlorperazine   10 mg Intravenous Once   rosuvastatin   10 mg Oral QPM   sevelamer  carbonate  800 mg Oral TID WC   sodium chloride  flush  5 mL Intracatheter Q8H   Continuous Infusions:  sodium chloride  Stopped (05/01/24 0014)   albumin  human     anticoagulant sodium citrate      heparin  1,900 Units/hr (05/10/24 0546)   micafungin  (MYCAMINE ) 100 mg in sodium chloride  0.9 % 100 mL IVPB Stopped (05/09/24 1155)   piperacillin -tazobactam (ZOSYN )  IV 3.375 g (05/10/24  0547)   PRN Meds:.sodium chloride , acetaminophen , acetaminophen , albumin  human, alteplase , anticoagulant sodium citrate , artificial tears, diphenhydrAMINE , fentaNYL  (SUBLIMAZE ) injection, heparin , hydrALAZINE , ipratropium, lidocaine  (PF), lidocaine -prilocaine , magic mouthwash, menthol -cetylpyridinium, naphazoline-glycerin , ondansetron  (ZOFRAN ) IV, oxyCODONE , pentafluoroprop-tetrafluoroeth, phenol, sodium chloride , sodium chloride  flush, white petrolatum   Antimicrobials: Anti-infectives (From admission, onward)    Start     Dose/Rate Route Frequency Ordered Stop   05/06/24 1400  piperacillin -tazobactam (ZOSYN ) IVPB 3.375 g         3.375 g 12.5 mL/hr over 240 Minutes Intravenous Every 8 hours 05/06/24 1227     05/01/24 1400  piperacillin -tazobactam (ZOSYN ) IVPB 2.25 g  Status:  Discontinued        2.25 g 100 mL/hr over 30 Minutes Intravenous Every 8 hours 05/01/24 0957 05/06/24 1227   05/01/24 1045  micafungin  (MYCAMINE ) 100 mg in sodium chloride  0.9 % 100 mL IVPB        100 mg 105 mL/hr over 1 Hours Intravenous Every 24 hours 05/01/24 0949     04/29/24 1000  micafungin  (MYCAMINE ) 150 mg in sodium chloride  0.9 % 100 mL IVPB  Status:  Discontinued        150 mg 107.5 mL/hr over 1 Hours Intravenous Every 24 hours 04/28/24 1036 05/01/24 0949   04/28/24 1345  micafungin  (MYCAMINE ) 100 mg in sodium chloride  0.9 % 100 mL IVPB        100 mg 105 mL/hr over 1 Hours Intravenous  Once 04/28/24 1245 04/28/24 1354   04/28/24 1000  micafungin  (MYCAMINE ) 100 mg in sodium chloride  0.9 % 100 mL IVPB  Status:  Discontinued        100 mg 105 mL/hr over 1 Hours Intravenous Every 24 hours 04/27/24 1219 04/28/24 1036   04/21/24 1400  clindamycin  (CLEOCIN ) 900 mg, gentamicin  (GARAMYCIN ) 240 mg in sodium chloride  0.9 % 1,000 mL for intraperitoneal lavage  Status:  Discontinued         Irrigation To Surgery 04/21/24 1346 04/21/24 1618   04/20/24 1400  piperacillin -tazobactam (ZOSYN ) IVPB 3.375 g  Status:  Discontinued        3.375 g 12.5 mL/hr over 240 Minutes Intravenous Every 8 hours 04/20/24 0755 05/01/24 0957   04/20/24 1000  metroNIDAZOLE  (FLAGYL ) IVPB 500 mg  Status:  Discontinued        500 mg 100 mL/hr over 60 Minutes Intravenous Every 12 hours 04/20/24 0320 04/20/24 0752   04/20/24 1000  micafungin  (MYCAMINE ) 150 mg in sodium chloride  0.9 % 100 mL IVPB  Status:  Discontinued        150 mg 107.5 mL/hr over 1 Hours Intravenous Every 24 hours 04/20/24 0752 04/27/24 1219   04/20/24 0245  ceFEPIme  (MAXIPIME ) 2 g in sodium chloride  0.9 % 100 mL IVPB  Status:  Discontinued        2 g 200 mL/hr over 30 Minutes Intravenous Every 8 hours  04/20/24 0232 04/20/24 0752   04/20/24 0100  metroNIDAZOLE  (FLAGYL ) IVPB 500 mg        500 mg 100 mL/hr over 60 Minutes Intravenous On call to O.R. 04/20/24 0004 04/20/24 0100   04/14/24 1800  ceFEPIme  (MAXIPIME ) 2 g in sodium chloride  0.9 % 100 mL IVPB        2 g 200 mL/hr over 30 Minutes Intravenous Every 8 hours 04/14/24 1003 04/19/24 0957   04/14/24 1600  vancomycin  (VANCOCIN ) IVPB 1000 mg/200 mL premix  Status:  Discontinued        1,000 mg 200 mL/hr over 60 Minutes Intravenous Every 24  hours 04/13/24 1420 04/14/24 1000   04/14/24 1600  vancomycin  (VANCOREADY) IVPB 1500 mg/300 mL  Status:  Discontinued        1,500 mg 150 mL/hr over 120 Minutes Intravenous Every 24 hours 04/14/24 1002 04/16/24 0744   04/14/24 1200  vancomycin  (VANCOCIN ) IVPB 1000 mg/200 mL premix  Status:  Discontinued        1,000 mg 200 mL/hr over 60 Minutes Intravenous Every 24 hours 04/13/24 1053 04/13/24 1109   04/13/24 2200  ceFEPIme  (MAXIPIME ) 2 g in sodium chloride  0.9 % 100 mL IVPB  Status:  Discontinued        2 g 200 mL/hr over 30 Minutes Intravenous Every 12 hours 04/13/24 1036 04/13/24 1109   04/13/24 2200  ceFEPIme  (MAXIPIME ) 2 g in sodium chloride  0.9 % 100 mL IVPB  Status:  Discontinued        2 g 200 mL/hr over 30 Minutes Intravenous Every 12 hours 04/13/24 1425 04/14/24 1003   04/13/24 1500  vancomycin  (VANCOCIN ) 2,000 mg in sodium chloride  0.9 % 500 mL IVPB        2,000 mg 260 mL/hr over 120 Minutes Intravenous  Once 04/13/24 1409 04/13/24 1850   04/13/24 1500  ceFEPIme  (MAXIPIME ) 2 g in sodium chloride  0.9 % 100 mL IVPB  Status:  Discontinued        2 g 200 mL/hr over 30 Minutes Intravenous Every 12 hours 04/13/24 1420 04/13/24 1425   04/13/24 1500  metroNIDAZOLE  (FLAGYL ) IVPB 500 mg  Status:  Discontinued        500 mg 100 mL/hr over 60 Minutes Intravenous Every 12 hours 04/13/24 1420 04/17/24 0725   04/13/24 1200  vancomycin  (VANCOCIN ) 2,000 mg in sodium chloride  0.9 % 500 mL IVPB  Status:   Discontinued        2,000 mg 260 mL/hr over 120 Minutes Intravenous  Once 04/13/24 1052 04/13/24 1121   04/13/24 1100  metroNIDAZOLE  (FLAGYL ) IVPB 500 mg  Status:  Discontinued        500 mg 100 mL/hr over 60 Minutes Intravenous 2 times daily 04/13/24 1007 04/13/24 1109   04/13/24 1100  vancomycin  (VANCOCIN ) IVPB 1000 mg/200 mL premix  Status:  Discontinued        1,000 mg 200 mL/hr over 60 Minutes Intravenous  Once 04/13/24 1007 04/13/24 1020   04/13/24 1015  ceFEPIme  (MAXIPIME ) 2 g in sodium chloride  0.9 % 100 mL IVPB        2 g 200 mL/hr over 30 Minutes Intravenous STAT 04/13/24 1007 04/14/24 1721   04/10/24 2200  ceFAZolin  (ANCEF ) IVPB 2g/100 mL premix        2 g 200 mL/hr over 30 Minutes Intravenous Every 8 hours 04/10/24 1833 04/11/24 0531   04/10/24 0600  ceFAZolin  (ANCEF ) IVPB 2g/100 mL premix        2 g 200 mL/hr over 30 Minutes Intravenous On call to O.R. 04/10/24 0533 04/10/24 1526        I have personally reviewed the following labs and images: CBC: Recent Labs  Lab 05/06/24 0500 05/07/24 0526 05/08/24 0631 05/09/24 1351 05/10/24 0600  WBC 11.6* 10.8* 12.3* 11.5* 11.3*  HGB 8.7* 9.1* 9.9* 10.2* 10.5*  HCT 27.2* 28.2* 31.1* 31.5* 32.8*  MCV 91.9 91.3 94.2 91.3 94.3  PLT 93* 92* 113* 124* 131*   BMP &GFR Recent Labs  Lab 05/04/24 0341 05/05/24 0425 05/06/24 0500 05/07/24 0415 05/08/24 0901 05/09/24 0830 05/10/24 0600  NA 133* 134* 132* 135 133* 132* 132*  K 5.0 4.1 3.7 4.2 4.5 4.6 4.8  CL 99 97* 96* 103 99 98 100  CO2 21* 26 24 21* 21* 20* 18*  GLUCOSE 166* 184* 154* 143* 105* 139* 100*  BUN 109* 72* 78* 74* 90* 89* 89*  CREATININE 3.86* 2.53* 2.27* 1.94* 2.56* 2.84* 2.29*  CALCIUM  9.2 8.9 8.6* 9.0 9.4 9.2 9.7  MG 2.0 2.0 1.8 1.9  --   --   --   PHOS 7.2* 5.2* 3.9 3.4  --  4.7* 4.7*   Estimated Creatinine Clearance: 35.5 mL/min (A) (by C-G formula based on SCr of 2.29 mg/dL (H)). Liver & Pancreas: Recent Labs  Lab 05/05/24 0425 05/06/24 0500  05/07/24 0415 05/09/24 0830 05/10/24 0600  ALBUMIN  2.5* 2.5* 2.6* 2.6* 2.9*   No results for input(s): LIPASE, AMYLASE in the last 168 hours. No results for input(s): AMMONIA in the last 168 hours. Diabetic: No results for input(s): HGBA1C in the last 72 hours. Recent Labs  Lab 05/09/24 1615 05/09/24 1949 05/09/24 2338 05/10/24 0437 05/10/24 0739  GLUCAP 118* 155* 137* 155* 87   Cardiac Enzymes: No results for input(s): CKTOTAL, CKMB, CKMBINDEX, TROPONINI in the last 168 hours. No results for input(s): PROBNP in the last 8760 hours. Coagulation Profile: Recent Labs  Lab 05/04/24 1020  INR 1.1   Thyroid  Function Tests: No results for input(s): TSH, T4TOTAL, FREET4, T3FREE, THYROIDAB in the last 72 hours. Lipid Profile: No results for input(s): CHOL, HDL, LDLCALC, TRIG, CHOLHDL, LDLDIRECT in the last 72 hours. Anemia Panel: No results for input(s): VITAMINB12, FOLATE, FERRITIN, TIBC, IRON , RETICCTPCT in the last 72 hours. Urine analysis:    Component Value Date/Time   COLORURINE YELLOW 04/13/2024 1018   APPEARANCEUR CLOUDY (A) 04/13/2024 1018   LABSPEC 1.016 04/13/2024 1018   PHURINE 6.0 04/13/2024 1018   GLUCOSEU NEGATIVE 04/13/2024 1018   HGBUR LARGE (A) 04/13/2024 1018   BILIRUBINUR NEGATIVE 04/13/2024 1018   KETONESUR 5 (A) 04/13/2024 1018   PROTEINUR 100 (A) 04/13/2024 1018   NITRITE NEGATIVE 04/13/2024 1018   LEUKOCYTESUR LARGE (A) 04/13/2024 1018   Sepsis Labs: Invalid input(s): PROCALCITONIN, LACTICIDVEN  Microbiology: No results found for this or any previous visit (from the past 240 hours).  Radiology Studies: No results found.     Lynnea Vandervoort T. Laiken Nohr [facility]  If 7PM-7AM, please contact night-coverage [email or web address] 05/10/2024, 11:48 AM

## 2024-05-10 NOTE — Progress Notes (Signed)
 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 Continue right lower quadrant abdominal wall and intraperitoneal drains for now. Low threshold to repeat CAT scan of abdomen pelvis prior to that on Sunday/Monday.   Nutrition: Regular diet as tolerated Calorie counts Cycling tube feeds Bowel regimen for ileostomy Weaned off TPN 6/16    Infection due to delayed bowel perforation and abdominal wall infection/necrosis Growing multiple organisms including Candida albicans, Enterobacter, and Enterococcus.   IV Zosyn /micafungin  Anticipate prolonged antibiotics given the abdominal wall infection/necrosis and need for Phasix mesh.  Change wound vac q MonWedFri   Metabolic encephalopathy  Appears resolved    Disposition: 4NP.  Need to have internal medicine help follow as well given his complexity.  Continue nephrology.  Pulmonary and cardiology as needed.          Krystal Spinner, MD [facility] practice Office: [phone]        Chief Complaint: Peristomal herniae  Subjective: Patient in bed, comfortable, no complaints.  Good conversation this afternoon.  Objective: Vital signs in last 24 hours: Temp:  [96.4 F (35.8 C)-98.4 F (36.9 C)] 97.1 F (36.2 C) (06/29 1204) Pulse Rate:  [99-112] 100 (06/29 1204) Resp:  [13-20] 15 (06/29 1204) BP: (113-138)/(63-82) 113/77 (06/29 1204) SpO2:  [93 %-97 %] 97 % (06/29 1204) Last BM Date : 05/09/24  Intake/Output from previous day: 06/28 0701 - 06/29 0700 In: 1597.4 [I.V.:1018.9; IV Piggyback:578.4] Out: 4525 [Urine:160; Drains:3515;  Stool:850] Intake/Output this shift: Total I/O In: -  Out: 630 [Drains:530; Stool:100]  Physical Exam: HEENT - sclerae clear, mucous membranes moist Abdomen - soft without distension; drains, ostomy bags, and VAC all intact  Lab Results:  Recent Labs    05/09/24 1351 05/10/24 0600  WBC 11.5* 11.3*  HGB 10.2* 10.5*  HCT 31.5* 32.8*  PLT 124* 131*   BMET Recent Labs    05/09/24 0830 05/10/24 0600  NA 132* 132*  K 4.6 4.8  CL 98 100  CO2 20* 18*  GLUCOSE 139* 100*  BUN 89* 89*  CREATININE 2.84* 2.29*  CALCIUM  9.2 9.7   PT/INR No results for input(s): LABPROT, INR in the last 72 hours. Comprehensive Metabolic Panel:    Component Value Date/Time   NA 132 (L) 05/10/2024 0600   NA 132 (L) 05/09/2024 0830   K 4.8 05/10/2024 0600   K 4.6 05/09/2024 0830   CL 100 05/10/2024 0600   CL 98 05/09/2024 0830   CO2 18 (L) 05/10/2024 0600   CO2 20 (L) 05/09/2024 0830   BUN 89 (H) 05/10/2024 0600   BUN 89 (H) 05/09/2024 0830   CREATININE 2.29 (H) 05/10/2024 0600   CREATININE 2.84 (H) 05/09/2024 0830   GLUCOSE 100 (H) 05/10/2024 0600   GLUCOSE 139 (H) 05/09/2024 0830   CALCIUM  9.7 05/10/2024 0600   CALCIUM  9.2 05/09/2024 0830   AST 20 04/27/2024 0453   AST 25 04/26/2024 0644   ALT 29 04/27/2024 0453   ALT 33 04/26/2024 0644   ALKPHOS 58 04/27/2024 0453   ALKPHOS 58 04/26/2024 0644   BILITOT 0.6 04/27/2024 0453   BILITOT 0.9 04/26/2024 0644   PROT 5.5 (L) 04/27/2024 0453   PROT 5.6 (L) 04/26/2024 9355  ALBUMIN  2.9 (L) 05/10/2024 0600   ALBUMIN  2.6 (L) 05/09/2024 0830    Studies/Results: DG CHEST PORT 1 VIEW Result Date: 05/09/2024 CLINICAL DATA:  10026 Shortness of breath 10026 EXAM: PORTABLE CHEST - 1 VIEW COMPARISON:  04/22/2024 FINDINGS: Interval extubation. Feeding tube placement at least as far as the stomach, tip not seen. Stable right arm PICC. Interval removal of left IJ dialysis catheter. Lungs clear. Heart size and mediastinal contours are within  normal limits. No effusion. Visualized bones unremarkable. IMPRESSION: Interval extubation. No acute cardiopulmonary disease. Electronically Signed   By: JONETTA Faes M.D.   On: 05/09/2024 11:53      Krystal Spinner 05/10/2024  Patient ID: Kristopher Thompson, Kristopher Thompson   DOB: [birth year], 73 y.o.   MRN: [id number]

## 2024-05-10 NOTE — Plan of Care (Signed)
  Problem: Education: Goal: Knowledge of General Education information will improve Description: Including pain rating scale, medication(s)/side effects and non-pharmacologic comfort measures Outcome: Progressing   Problem: Health Behavior/Discharge Planning: Goal: Ability to manage health-related needs will improve Outcome: Progressing   Problem: Clinical Measurements: Goal: Ability to maintain clinical measurements within normal limits will improve Outcome: Progressing Goal: Will remain free from infection Outcome: Progressing Goal: Diagnostic test results will improve Outcome: Progressing Goal: Respiratory complications will improve Outcome: Progressing Goal: Cardiovascular complication will be avoided Outcome: Progressing   Problem: Activity: Goal: Risk for activity intolerance will decrease Outcome: Progressing   Problem: Nutrition: Goal: Adequate nutrition will be maintained Outcome: Progressing   Problem: Elimination: Goal: Will not experience complications related to bowel motility Outcome: Progressing Goal: Will not experience complications related to urinary retention Outcome: Progressing   Problem: Pain Managment: Goal: General experience of comfort will improve and/or be controlled Outcome: Progressing   Problem: Safety: Goal: Ability to remain free from injury will improve Outcome: Progressing   Problem: Skin Integrity: Goal: Risk for impaired skin integrity will decrease Outcome: Progressing   Problem: Education: Goal: Ability to describe self-care measures that may prevent or decrease complications (Diabetes Survival Skills Education) will improve Outcome: Progressing Goal: Individualized Educational Video(s) Outcome: Progressing   Problem: Coping: Goal: Ability to adjust to condition or change in health will improve Outcome: Progressing   Problem: Fluid Volume: Goal: Ability to maintain a balanced intake and output will improve Outcome:  Progressing   Problem: Health Behavior/Discharge Planning: Goal: Ability to identify and utilize available resources and services will improve Outcome: Progressing Goal: Ability to manage health-related needs will improve Outcome: Progressing   Problem: Metabolic: Goal: Ability to maintain appropriate glucose levels will improve Outcome: Progressing   Problem: Nutritional: Goal: Maintenance of adequate nutrition will improve Outcome: Progressing Goal: Progress toward achieving an optimal weight will improve Outcome: Progressing   Problem: Skin Integrity: Goal: Risk for impaired skin integrity will decrease Outcome: Progressing   Problem: Tissue Perfusion: Goal: Adequacy of tissue perfusion will improve Outcome: Progressing   Problem: Fluid Volume: Goal: Hemodynamic stability will improve Outcome: Progressing   Problem: Clinical Measurements: Goal: Diagnostic test results will improve Outcome: Progressing Goal: Signs and symptoms of infection will decrease Outcome: Progressing   Problem: Respiratory: Goal: Ability to maintain adequate ventilation will improve Outcome: Progressing

## 2024-05-10 NOTE — Progress Notes (Signed)
 PHARMACY - ANTICOAGULATION CONSULT NOTE  Pharmacy Consult for Heparin  Indication: h/o PE  Allergies  Allergen Reactions   Demerol  [Meperidine ] Nausea And Vomiting and Other (See Comments)    SEVERE NAUSEA    Patient Measurements: Height: 5' 11 (180.3 cm) Weight: 99.3 kg (218 lb 14.7 oz) IBW/kg (Calculated) : 75.3 HEPARIN  DW (KG): 100.8  Vital Signs: Temp: 97.1 F (36.2 C) (06/29 1204) Temp Source: Axillary (06/29 1204) BP: 113/77 (06/29 1204) Pulse Rate: 100 (06/29 1204)  Labs: Recent Labs    05/08/24 0631 05/08/24 0901 05/09/24 0830 05/09/24 1351 05/10/24 0600  HGB 9.9*  --   --  10.2* 10.5*  HCT 31.1*  --   --  31.5* 32.8*  PLT 113*  --   --  124* 131*  HEPARINUNFRC 0.66  --   --  0.52 0.52  CREATININE  --  2.56* 2.84*  --  2.29*    Estimated Creatinine Clearance: 35.5 mL/min (A) (by C-G formula based on SCr of 2.29 mg/dL (H)).  Assessment:  Patient with prolonged hospitalization including post-op complications resulting in bowel perforation with open abdomen. Has been closed and transferred to [facility]. On Eliquis PTA for hx of PE. Has been on heparin  gtt since 6/6.   Heparin  level therapeutic; CBC stable/improving, no bleeding reported.  Goal of Therapy:  Heparin  level 0.3-0.7 units/ml Monitor platelets by anticoagulation protocol: Yes   Plan:  Continue heparin  gtt at 1900 units/hr Daily heparin  level and CBC daily Monitor for s/sx of bleeding Continue to hold DOAC because patient might still need a G-tube  Darice Vicario D. [place], PharmD, BCPS, BCCCP 05/10/2024, 1:59 PM

## 2024-05-10 NOTE — Progress Notes (Signed)
 Pt has home CPAP.

## 2024-05-11 ENCOUNTER — Inpatient Hospital Stay (HOSPITAL_COMMUNITY)

## 2024-05-11 DIAGNOSIS — K631 Perforation of intestine (nontraumatic): Secondary | ICD-10-CM | POA: Diagnosis not present

## 2024-05-11 DIAGNOSIS — R000 Tachycardia, unspecified: Secondary | ICD-10-CM | POA: Diagnosis not present

## 2024-05-11 DIAGNOSIS — N9989 Other postprocedural complications and disorders of genitourinary system: Secondary | ICD-10-CM | POA: Clinically undetermined

## 2024-05-11 DIAGNOSIS — A419 Sepsis, unspecified organism: Secondary | ICD-10-CM | POA: Diagnosis not present

## 2024-05-11 DIAGNOSIS — C679 Malignant neoplasm of bladder, unspecified: Secondary | ICD-10-CM | POA: Diagnosis not present

## 2024-05-11 HISTORY — PX: IR NEPHROSTOMY EXCHANGE LEFT: IMG6069

## 2024-05-11 LAB — HEPARIN LEVEL (UNFRACTIONATED): Heparin Unfractionated: 0.27 [IU]/mL — ABNORMAL LOW (ref 0.30–0.70)

## 2024-05-11 LAB — COMPREHENSIVE METABOLIC PANEL WITH GFR
ALT: 58 U/L — ABNORMAL HIGH (ref 0–44)
AST: 34 U/L (ref 15–41)
Albumin: 3 g/dL — ABNORMAL LOW (ref 3.5–5.0)
Alkaline Phosphatase: 93 U/L (ref 38–126)
Anion gap: 13 (ref 5–15)
BUN: 96 mg/dL — ABNORMAL HIGH (ref 8–23)
CO2: 20 mmol/L — ABNORMAL LOW (ref 22–32)
Calcium: 9.9 mg/dL (ref 8.9–10.3)
Chloride: 100 mmol/L (ref 98–111)
Creatinine, Ser: 2.52 mg/dL — ABNORMAL HIGH (ref 0.61–1.24)
GFR, Estimated: 27 mL/min — ABNORMAL LOW (ref >60)
Glucose, Bld: 147 mg/dL — ABNORMAL HIGH (ref 70–99)
Potassium: 4.4 mmol/L (ref 3.5–5.1)
Sodium: 133 mmol/L — ABNORMAL LOW (ref 135–145)
Total Bilirubin: 0.7 mg/dL (ref 0.0–1.2)
Total Protein: 7 g/dL (ref 6.5–8.1)

## 2024-05-11 LAB — CBC
HCT: 34.2 % — ABNORMAL LOW (ref 39.0–52.0)
Hemoglobin: 10.9 g/dL — ABNORMAL LOW (ref 13.0–17.0)
MCH: 29.5 pg (ref 26.0–34.0)
MCHC: 31.9 g/dL (ref 30.0–36.0)
MCV: 92.4 fL (ref 80.0–100.0)
Platelets: 145 10*3/uL — ABNORMAL LOW (ref 150–400)
RBC: 3.7 MIL/uL — ABNORMAL LOW (ref 4.22–5.81)
RDW: 18.8 % — ABNORMAL HIGH (ref 11.5–15.5)
WBC: 9.4 10*3/uL (ref 4.0–10.5)
nRBC: 0 % (ref 0.0–0.2)

## 2024-05-11 LAB — RENAL FUNCTION PANEL
Albumin: 3 g/dL — ABNORMAL LOW (ref 3.5–5.0)
Anion gap: 14 (ref 5–15)
BUN: 95 mg/dL — ABNORMAL HIGH (ref 8–23)
CO2: 19 mmol/L — ABNORMAL LOW (ref 22–32)
Calcium: 9.8 mg/dL (ref 8.9–10.3)
Chloride: 99 mmol/L (ref 98–111)
Creatinine, Ser: 2.61 mg/dL — ABNORMAL HIGH (ref 0.61–1.24)
GFR, Estimated: 25 mL/min — ABNORMAL LOW (ref >60)
Glucose, Bld: 183 mg/dL — ABNORMAL HIGH (ref 70–99)
Phosphorus: 1.2 mg/dL — ABNORMAL LOW (ref 2.5–4.6)
Potassium: 4.9 mmol/L (ref 3.5–5.1)
Sodium: 132 mmol/L — ABNORMAL LOW (ref 135–145)

## 2024-05-11 LAB — GLUCOSE, CAPILLARY
Glucose-Capillary: 191 mg/dL — ABNORMAL HIGH (ref 70–99)
Glucose-Capillary: 217 mg/dL — ABNORMAL HIGH (ref 70–99)
Glucose-Capillary: 129 mg/dL — ABNORMAL HIGH (ref 70–99)
Glucose-Capillary: 121 mg/dL — ABNORMAL HIGH (ref 70–99)
Glucose-Capillary: 155 mg/dL — ABNORMAL HIGH (ref 70–99)
Glucose-Capillary: 115 mg/dL — ABNORMAL HIGH (ref 70–99)

## 2024-05-11 LAB — CREATININE, FLUID (PLEURAL, PERITONEAL, JP DRAINAGE): Creat, Fluid: 17.8 mg/dL

## 2024-05-11 LAB — PREALBUMIN: Prealbumin: 40 mg/dL — ABNORMAL HIGH (ref 18–38)

## 2024-05-11 MED ORDER — LIDOCAINE HCL 1 % IJ SOLN
INTRAMUSCULAR | Status: AC
  Filled 2024-05-11: qty 20

## 2024-05-11 MED ORDER — VITAL 1.5 CAL PO LIQD
1000.0000 mL | ORAL | Status: DC
  Administered 2024-05-11: 1000 mL

## 2024-05-11 MED ORDER — SODIUM PHOSPHATES 45 MMOLE/15ML IV SOLN
30.0000 mmol | Freq: Once | INTRAVENOUS | Status: AC
  Administered 2024-05-11: 30 mmol via INTRAVENOUS
  Filled 2024-05-11: qty 10

## 2024-05-11 MED ORDER — MELATONIN 5 MG PO TABS
5.0000 mg | ORAL_TABLET | Freq: Every day | ORAL | Status: DC
  Administered 2024-05-11 – 2024-05-21 (×11): 5 mg via ORAL
  Filled 2024-05-11 (×11): qty 1

## 2024-05-11 MED ORDER — LIDOCAINE HCL 1 % IJ SOLN
20.0000 mL | Freq: Once | INTRAMUSCULAR | Status: AC
  Administered 2024-05-11: 10 mL

## 2024-05-11 MED ORDER — IOHEXOL 300 MG/ML  SOLN
50.0000 mL | Freq: Once | INTRAMUSCULAR | Status: AC | PRN
  Administered 2024-05-11: 50 mL

## 2024-05-11 MED ORDER — VITAL 1.5 CAL PO LIQD
1440.0000 mL | ORAL | Status: DC
  Filled 2024-05-11: qty 1659

## 2024-05-11 NOTE — Progress Notes (Signed)
 Patient ID: Kristopher Thompson, male   DOB: 16-Dec-1950, 73 y.o.   MRN: [id number]  20 Days Post-Op Subjective: Feeling ok today.  Had a bit of a rough time in IR this morning but now better.  Objective: Vital signs in last 24 hours: Temp:  [97.7 F (36.5 C)-98.8 F (37.1 C)] 98.6 F (37 C) (06/30 1534) Pulse Rate:  [96-110] 108 (06/30 1140) Resp:  [15-18] 18 (06/30 1140) BP: (102-128)/(66-83) 102/83 (06/30 1140) SpO2:  [96 %-98 %] 98 % (06/30 1140)  Intake/Output from previous day: 06/29 0701 - 06/30 0700 In: 4089.9 [P.O.:240; I.V.:185.4; WH/HU:6526.6; IV Piggyback:191.2] Out: 2785 [Urine:150; Drains:2110; Stool:525] Intake/Output this shift: Total I/O In: -  Out: 525 [Urine:250; Drains:175; Stool:100]  Physical Exam:  General: Alert and oriented Abd/GU: Bilateral PCNs draining grossly clear urine with improved output from right PCN after repositioning, urostomy pink with some urine in bag  Lab Results: Recent Labs    05/09/24 1351 05/10/24 0600 05/11/24 0500  HGB 10.2* 10.5* 10.9*  HCT 31.5* 32.8* 34.2*      Latest Ref Rng & Units 05/11/2024    5:00 AM 05/10/2024    6:00 AM 05/09/2024    1:51 PM  CBC  WBC 4.0 - 10.5 K/uL 9.4  11.3  11.5   Hemoglobin 13.0 - 17.0 g/dL 89.0  89.4  89.7   Hematocrit 39.0 - 52.0 % 34.2  32.8  31.5   Platelets 150 - 400 K/uL 145  131  124      BMET Recent Labs    05/11/24 0000 05/11/24 0500  NA 133* 132*  K 4.4 4.9  CL 100 99  CO2 20* 19*  GLUCOSE 147* 183*  BUN 96* 95*  CREATININE 2.52* 2.61*  CALCIUM  9.9 9.8     Studies/Results: IR NEPHROSTOMY EXCHANGE BILATERAL Result Date: 05/11/2024 INDICATION: Status post prior cystectomy with ileal conduit formation. Recent bowel surgery for bowel perforation. Concern for possible urine leak. EXAM: 1. LEFT PERCUTANEOUS NEPHROSTOMY TUBE EXCHANGE UNDER FLUOROSCOPY INCLUDING FULL ANTEGRADE NEPHROSTOGRAM 2. RIGHT PERCUTANEOUS NEPHROSTOMY TUBE EXCHANGE UNDER FLUOROSCOPY INCLUDING FULL  ANTEGRADE NEPHROSTOGRAM COMPARISON:  None Available. MEDICATIONS: None ANESTHESIA/SEDATION: None CONTRAST:  50 mL Omnipaque  300-administered into the collecting system(s) FLUOROSCOPY: Radiation Exposure Index (as provided by the fluoroscopic device): 111 mGy Kerma COMPLICATIONS: None immediate. PROCEDURE: Informed written consent was obtained from the patient after a thorough discussion of the procedural risks, benefits and alternatives. All questions were addressed. Maximal Sterile Barrier Technique was utilized including caps, mask, sterile gowns, sterile gloves, sterile drape, hand hygiene and skin antiseptic. A timeout was performed prior to the initiation of the procedure. Both percutaneous nephrostomy tubes were injected with contrast material under fluoroscopy and multiple fluoroscopy loops and images were saved. The left percutaneous nephrostomy tube was then removed over a guidewire and exchanged for a 5 French catheter over a wire. The catheter was further advanced into the left ureter and additional nephrostogram performed to assess ureteral patency. A new 10 French percutaneous nephrostomy tube was then formed over a guidewire in the left renal pelvis A new 10 French right percutaneous nephrostomy tube was then exchanged over a guidewire and formed in the renal pelvis. Both nephrostomy tubes were secured at the skin with Prolene retention sutures, adhesive StatLock devices and attached to new gravity drainage bags. FINDINGS: Injection of the left percutaneous nephrostomy tube demonstrates retraction of the tube which is peripherally located in a lower pole calyx. Contrast injection demonstrates left-sided hydronephrosis and poor drainage into the ileal conduit  with suggestion of a distal ureteral stricture. This was confirmed by advancement of a 5 French catheter into the distal ureter with contrast injection demonstrating a high-grade stricture but flow of contrast through the stricture and into the  ileal conduit once a catheter was advanced near the level of the stricture. There is no evidence of left-sided urine leak. The left nephrostomy tube was replaced with a new 10 French catheter formed at the level of the renal pelvis. A right nephrostogram demonstrates retraction the right nephrostomy tube into a lower pole calyx. Contrast injection demonstrates good flow of contrast into the right ureter and ileal conduit without evidence of ureteral stricture or urine leak. The right nephrostomy tube was replaced with a new 10 French catheter formed at the level of the renal pelvis. IMPRESSION: 1. 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 Injection of contrast at the level of the distal ureter does show flow through the stricture and into the ileal conduit. No left-sided urine leak demonstrated. 2. Right nephrostogram demonstrates normally patent ureter and ureteral anastomosis with the ileal conduit. No right-sided urine leak demonstrated. 3. Both nephrostomy tubes had retracted into lower pole calices and were replaced with new 10 French catheters formed at the level of the renal pelvis bilaterally. Electronically Signed   By: Marcey Moan M.D.   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  No clear extravasation from conduit but not completely evaluated.  RLQ drain with significant output and will re-check Cr level.  Plan for loopogram tomorrow or Wednesday to evaluate conduit further for possible site of urine leak.  With right nephrostomy tube draining  better after repositioning, hopeful renal function will improve again.   LOS: 31 days   Kristopher Thompson 05/11/2024, 3:50 PM

## 2024-05-11 NOTE — Progress Notes (Signed)
 Patient ID: Kristopher Thompson, male   DOB: Jul 19, 1951, 73 y.o.   MRN: [id number]  Weekend notes reviewed.  UOP not clearly documented well.  Will request that nursing more accurately record UOP from left nephrostomy, right nephrostomy, and urostomy for total urine output.  Renal function stabilizing around Cr of 2.5.  Will discuss with IR today about possible loopogram +/- nephrostograms to evaluate site of urine leak identified last week.

## 2024-05-11 NOTE — Progress Notes (Signed)
Pt has home CPAP machine.  

## 2024-05-11 NOTE — Plan of Care (Signed)
  Problem: Education: Goal: Knowledge of General Education information will improve Description: Including pain rating scale, medication(s)/side effects and non-pharmacologic comfort measures Outcome: Progressing   Problem: Health Behavior/Discharge Planning: Goal: Ability to manage health-related needs will improve Outcome: Progressing   Problem: Clinical Measurements: Goal: Respiratory complications will improve Outcome: Progressing   Problem: Activity: Goal: Risk for activity intolerance will decrease Outcome: Progressing   Problem: Elimination: Goal: Will not experience complications related to bowel motility Outcome: Progressing Goal: Will not experience complications related to urinary retention Outcome: Progressing   Problem: Pain Managment: Goal: General experience of comfort will improve and/or be controlled Outcome: Progressing   Problem: Safety: Goal: Ability to remain free from injury will improve Outcome: Progressing

## 2024-05-11 NOTE — Progress Notes (Signed)
 PROGRESS NOTE  Kristopher Thompson FMW:[id number] DOB: 09-07-1951   PCP: [name], MD  Patient is from: Home  DOA: 04/10/2024 LOS: 31  Chief complaints No chief complaint on file.    Brief Narrative / Interim history: 73 year old M with PMH of prostate cancer, multiple abdominal surgeries, DM-2, PE on Eliquis, HFpEF, obesity and anemia initially presented to the hospital on 5/30 for robotic/laparoscopic surgery for incarcerated abdominal wall hernias requiring extensive lysis, mesh insertion, repair, urostomy ileal conduit revision thereafter admitted to the ICU due to tachycardia and tachypnea.  Initially placed on norepinephrine  and an arterial line was placed.  Patient declined NG tube placement but eventually agreed.  Patient was taken back to the OR on 6/9 due to perforated bowel and postop shock.  Required multiple pressors, steroids and PRBC transfusion after washout/closure in the OR.  On 6/11 developed AKI requiring dialysis..  Patient was extubated on 6/14.   Significant events/procedures 5/30-laparoscopic surgery 6/2 CCM consult and vasopressor 6/4 off NE. Pulled out his NGT refused replacement. After multiple emesis  6/5 NG tube replaced. 6/6 transferred to MedSurg. 6/9: OR for perforated bowel, post-op shock 6/10: NE, Phenylephrine , vasopressin , steroids, 1u PRBCs, back to OR for washout and closure 6/11: Worsening AKI with fluid overload, start CRRT, decreasing pressor requirements 6/14: Off CRRT, arousable.  Extubated. 6/15 transferred to [facility] for iHD; montoring for renal recovery 6/16 iHD, severely encephalopathic with high BUN 6/17 restarted CRRT for to ongoing uremia and encephalopathy 6/18 was awake enough to work with PT, OT 6/23 bilateral PCN placed by urology. 6/24 MS imrpoving, Neph tubes in and draining 6/30-bilateral nephrostomy tube exchange by IR  Subjective: Seen and examined earlier this morning.  No major events overnight or this morning.   Significant output from bilateral JP drains and minimal output from bilateral PCN.   Objective: Vitals:   05/10/24 2330 05/11/24 0300 05/11/24 0740 05/11/24 1140  BP: 128/77 127/77 109/70 102/83  Pulse: (!) 104 (!) 105 96 (!) 108  Resp: 18 17 15 18   Temp: 98.4 F (36.9 C) 97.8 F (36.6 C) 97.7 F (36.5 C) 98.8 F (37.1 C)  TempSrc: Axillary Axillary Axillary Axillary  SpO2: 96% 97% 96% 98%  Weight:      Height:        Examination:  GENERAL: No apparent distress.  Nontoxic. HEENT: MMM.  Vision and hearing grossly intact.  NECK: Supple.  No apparent JVD.  RESP:  No IWOB.  Fair aeration bilaterally. CVS:  RRR. Heart sounds normal.  ABD/GI/GU: BS+. Abd soft.  Wound VAC, colostomy, urostomy (with minimal urine), bilateral JP drain bulbs full and bilateral PCN with clear looking urine MSK/EXT:  Moves extremities. No apparent deformity. No edema.  SKIN: As above. NEURO: AA.  Oriented appropriately.  No apparent focal neuro deficit. PSYCH: Calm. Normal affect.    Microbiology summarized: 6/2-MRSA PCR screen nonreactive 6/2-blood cultures NGTD 6/9-blood culture NGTD 6/10-intra-abdominal tissue culture with E. 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  Postop management per their team. -Wound VAC and bilateral JP drains in place per surgery. -Bilateral PCN care per urology -Surgical culture as above. -On IV Zosyn  and micafungin  per ID. -  On regular diet supplemented by tube feed due to poor p.o. intake    Uremic and septic encephalopathy and delirium: Improving. -Treat treatable  causes. -Reorientation and delirium precaution   AKI secondary to ischemic ATN/septic shock/bilateral hydronephrosis: Reports nausea after eating which could be from uremia.  Renal function improving. Recent Labs    05/03/24 0453 05/04/24 0341 05/05/24 0425 05/06/24 0500 05/07/24 0415 05/08/24 0901 05/09/24 0830 05/10/24 0600 05/11/24 0000 05/11/24 0500  BUN 96* 109* 72* 78* 74* 90* 89* 89* 96* 95*  CREATININE 3.60* 3.86* 2.53* 2.27* 1.94* 2.56* 2.84* 2.29* 2.52* 2.61*  -Needed CRRT and HD.  Last HD on 6/23.  HD cath removed.  -S/p bilateral PCN on 6/23 and exchange on 6/30. -Continue Phos binder. -Nephrology, urology, surgery and IR on board  ABLA/right flank hematoma superimposed on ACD: Required transfusion at some point.  H&H stable. Recent Labs    04/30/24 0330 05/01/24 0417 05/01/24 1313 05/04/24 0341 05/06/24 0500 05/07/24 0526 05/08/24 0631 05/09/24 1351 05/10/24 0600 05/11/24 0500  HGB 8.4* 7.9* 8.2* 9.1* 8.7* 9.1* 9.9* 10.2* 10.5* 10.9*  - Continue monitoring.  History of PE on Eliquis at home - Currently on heparin  drip  Chronic HFpEF/essential hypertension: Appears euvolemic on exam.  Not on diuretics. - Monitor fluid and respiratory status  Diabetes type 2: A1c 5.3%.  On metformin at home. Recent Labs  Lab 05/10/24 1956 05/10/24 2326 05/11/24 0301 05/11/24 0753 05/11/24 1138  GLUCAP 145* 162* 191* 217* 129*  - Continue Semglee  25 units daily - Discontinued meal coverage given poor p.o. intake on 6/29. - Continue SSI-resistant every 4 hours.    Physical deconditioning -PT/OT- SNF  Class I obesity/increased nutrient needs Body mass index is 30.53 kg/m. Nutrition Problem: Increased nutrient needs Etiology: acute illness Signs/Symptoms: estimated needs Interventions: Tube feeding, Prostat, Juven   DVT prophylaxis:  Place TED hose Start: 04/28/24 0818 SCD's Start: 04/10/24 1834  Code Status: Full code Family Communication: Updated  patient's wife at bedside Level of care: Progressive Status is: Inpatient   Final disposition: Per primary   55 minutes with more than 50% spent in reviewing records, counseling patient/family and coordinating care.   Sch Meds:  Scheduled Meds:  Chlorhexidine  Gluconate Cloth  6 each Topical Daily   Chlorhexidine  Gluconate Cloth  6 each Topical Q0600   vitamin B-12  1,000 mcg Oral Daily   darbepoetin (ARANESP ) injection - DIALYSIS  200 mcg Subcutaneous Q Fri-1800   feeding supplement  237 mL Oral BID BM   feeding supplement (PROSource TF20)  60 mL Per Tube BID   feeding supplement (VITAL 1.5 CAL)  1,000 mL Per Tube Q24H   insulin  aspart  0-20 Units Subcutaneous Q4H   insulin  glargine-yfgn  25 Units Subcutaneous QHS   melatonin  5 mg Oral QHS   multivitamin with minerals  1 tablet Oral Daily   nutrition supplement (JUVEN)  1 packet Oral BID BM   mouth rinse  15 mL Mouth Rinse 4 times per day   polycarbophil  625 mg Oral BID   prochlorperazine   10 mg Intravenous Once   rosuvastatin   10 mg Oral QPM   sevelamer  carbonate  800 mg Oral TID WC   sodium chloride  flush  5 mL Intracatheter Q8H   Continuous Infusions:  sodium chloride  Stopped (05/01/24 0014)   albumin  human     anticoagulant sodium citrate      heparin  1,900 Units/hr (05/11/24 0445)   micafungin  (MYCAMINE ) 100 mg in sodium chloride  0.9 % 100 mL  IVPB 100 mg (05/11/24 1207)   piperacillin -tazobactam (ZOSYN )  IV 3.375 g (05/11/24 1402)   sodium PHOSPHATE  IVPB (in mmol) 30 mmol (05/11/24 1402)   PRN Meds:.sodium chloride , acetaminophen , acetaminophen , albumin  human, alteplase , anticoagulant sodium citrate , artificial tears, diphenhydrAMINE , fentaNYL  (SUBLIMAZE ) injection, heparin , hydrALAZINE , ipratropium, lidocaine  (PF), lidocaine -prilocaine , magic mouthwash, menthol -cetylpyridinium, naphazoline-glycerin , ondansetron  (ZOFRAN ) IV, oxyCODONE , pentafluoroprop-tetrafluoroeth, phenol, sodium chloride , sodium chloride  flush, white  petrolatum   Antimicrobials: Anti-infectives (From admission, onward)    Start     Dose/Rate Route Frequency Ordered Stop   05/06/24 1400  piperacillin -tazobactam (ZOSYN ) IVPB 3.375 g        3.375 g 12.5 mL/hr over 240 Minutes Intravenous Every 8 hours 05/06/24 1227     05/01/24 1400  piperacillin -tazobactam (ZOSYN ) IVPB 2.25 g  Status:  Discontinued        2.25 g 100 mL/hr over 30 Minutes Intravenous Every 8 hours 05/01/24 0957 05/06/24 1227   05/01/24 1045  micafungin  (MYCAMINE ) 100 mg in sodium chloride  0.9 % 100 mL IVPB        100 mg 105 mL/hr over 1 Hours Intravenous Every 24 hours 05/01/24 0949     04/29/24 1000  micafungin  (MYCAMINE ) 150 mg in sodium chloride  0.9 % 100 mL IVPB  Status:  Discontinued        150 mg 107.5 mL/hr over 1 Hours Intravenous Every 24 hours 04/28/24 1036 05/01/24 0949   04/28/24 1345  micafungin  (MYCAMINE ) 100 mg in sodium chloride  0.9 % 100 mL IVPB        100 mg 105 mL/hr over 1 Hours Intravenous  Once 04/28/24 1245 04/28/24 1354   04/28/24 1000  micafungin  (MYCAMINE ) 100 mg in sodium chloride  0.9 % 100 mL IVPB  Status:  Discontinued        100 mg 105 mL/hr over 1 Hours Intravenous Every 24 hours 04/27/24 1219 04/28/24 1036   04/21/24 1400  clindamycin  (CLEOCIN ) 900 mg, gentamicin  (GARAMYCIN ) 240 mg in sodium chloride  0.9 % 1,000 mL for intraperitoneal lavage  Status:  Discontinued         Irrigation To Surgery 04/21/24 1346 04/21/24 1618   04/20/24 1400  piperacillin -tazobactam (ZOSYN ) IVPB 3.375 g  Status:  Discontinued        3.375 g 12.5 mL/hr over 240 Minutes Intravenous Every 8 hours 04/20/24 0755 05/01/24 0957   04/20/24 1000  metroNIDAZOLE  (FLAGYL ) IVPB 500 mg  Status:  Discontinued        500 mg 100 mL/hr over 60 Minutes Intravenous Every 12 hours 04/20/24 0320 04/20/24 0752   04/20/24 1000  micafungin  (MYCAMINE ) 150 mg in sodium chloride  0.9 % 100 mL IVPB  Status:  Discontinued        150 mg 107.5 mL/hr over 1 Hours Intravenous Every 24 hours  04/20/24 0752 04/27/24 1219   04/20/24 0245  ceFEPIme  (MAXIPIME ) 2 g in sodium chloride  0.9 % 100 mL IVPB  Status:  Discontinued        2 g 200 mL/hr over 30 Minutes Intravenous Every 8 hours 04/20/24 0232 04/20/24 0752   04/20/24 0100  metroNIDAZOLE  (FLAGYL ) IVPB 500 mg        500 mg 100 mL/hr over 60 Minutes Intravenous On call to O.R. 04/20/24 0004 04/20/24 0100   04/14/24 1800  ceFEPIme  (MAXIPIME ) 2 g in sodium chloride  0.9 % 100 mL IVPB        2 g 200 mL/hr over 30 Minutes Intravenous Every 8 hours 04/14/24 1003 04/19/24 0957   04/14/24 1600  vancomycin  (VANCOCIN ) IVPB  1000 mg/200 mL premix  Status:  Discontinued        1,000 mg 200 mL/hr over 60 Minutes Intravenous Every 24 hours 04/13/24 1420 04/14/24 1000   04/14/24 1600  vancomycin  (VANCOREADY) IVPB 1500 mg/300 mL  Status:  Discontinued        1,500 mg 150 mL/hr over 120 Minutes Intravenous Every 24 hours 04/14/24 1002 04/16/24 0744   04/14/24 1200  vancomycin  (VANCOCIN ) IVPB 1000 mg/200 mL premix  Status:  Discontinued        1,000 mg 200 mL/hr over 60 Minutes Intravenous Every 24 hours 04/13/24 1053 04/13/24 1109   04/13/24 2200  ceFEPIme  (MAXIPIME ) 2 g in sodium chloride  0.9 % 100 mL IVPB  Status:  Discontinued        2 g 200 mL/hr over 30 Minutes Intravenous Every 12 hours 04/13/24 1036 04/13/24 1109   04/13/24 2200  ceFEPIme  (MAXIPIME ) 2 g in sodium chloride  0.9 % 100 mL IVPB  Status:  Discontinued        2 g 200 mL/hr over 30 Minutes Intravenous Every 12 hours 04/13/24 1425 04/14/24 1003   04/13/24 1500  vancomycin  (VANCOCIN ) 2,000 mg in sodium chloride  0.9 % 500 mL IVPB        2,000 mg 260 mL/hr over 120 Minutes Intravenous  Once 04/13/24 1409 04/13/24 1850   04/13/24 1500  ceFEPIme  (MAXIPIME ) 2 g in sodium chloride  0.9 % 100 mL IVPB  Status:  Discontinued        2 g 200 mL/hr over 30 Minutes Intravenous Every 12 hours 04/13/24 1420 04/13/24 1425   04/13/24 1500  metroNIDAZOLE  (FLAGYL ) IVPB 500 mg  Status:  Discontinued         500 mg 100 mL/hr over 60 Minutes Intravenous Every 12 hours 04/13/24 1420 04/17/24 0725   04/13/24 1200  vancomycin  (VANCOCIN ) 2,000 mg in sodium chloride  0.9 % 500 mL IVPB  Status:  Discontinued        2,000 mg 260 mL/hr over 120 Minutes Intravenous  Once 04/13/24 1052 04/13/24 1121   04/13/24 1100  metroNIDAZOLE  (FLAGYL ) IVPB 500 mg  Status:  Discontinued        500 mg 100 mL/hr over 60 Minutes Intravenous 2 times daily 04/13/24 1007 04/13/24 1109   04/13/24 1100  vancomycin  (VANCOCIN ) IVPB 1000 mg/200 mL premix  Status:  Discontinued        1,000 mg 200 mL/hr over 60 Minutes Intravenous  Once 04/13/24 1007 04/13/24 1020   04/13/24 1015  ceFEPIme  (MAXIPIME ) 2 g in sodium chloride  0.9 % 100 mL IVPB        2 g 200 mL/hr over 30 Minutes Intravenous STAT 04/13/24 1007 04/14/24 1721   04/10/24 2200  ceFAZolin  (ANCEF ) IVPB 2g/100 mL premix        2 g 200 mL/hr over 30 Minutes Intravenous Every 8 hours 04/10/24 1833 04/11/24 0531   04/10/24 0600  ceFAZolin  (ANCEF ) IVPB 2g/100 mL premix        2 g 200 mL/hr over 30 Minutes Intravenous On call to O.R. 04/10/24 0533 04/10/24 1526        I have personally reviewed the following labs and images: CBC: Recent Labs  Lab 05/07/24 0526 05/08/24 0631 05/09/24 1351 05/10/24 0600 05/11/24 0500  WBC 10.8* 12.3* 11.5* 11.3* 9.4  HGB 9.1* 9.9* 10.2* 10.5* 10.9*  HCT 28.2* 31.1* 31.5* 32.8* 34.2*  MCV 91.3 94.2 91.3 94.3 92.4  PLT 92* 113* 124* 131* 145*   BMP &GFR Recent Labs  Lab 05/05/24 0425 05/06/24 0500 05/07/24 0415 05/08/24 0901 05/09/24 0830 05/10/24 0600 05/11/24 0000 05/11/24 0500  NA 134* 132* 135 133* 132* 132* 133* 132*  K 4.1 3.7 4.2 4.5 4.6 4.8 4.4 4.9  CL 97* 96* 103 99 98 100 100 99  CO2 26 24 21* 21* 20* 18* 20* 19*  GLUCOSE 184* 154* 143* 105* 139* 100* 147* 183*  BUN 72* 78* 74* 90* 89* 89* 96* 95*  CREATININE 2.53* 2.27* 1.94* 2.56* 2.84* 2.29* 2.52* 2.61*  CALCIUM  8.9 8.6* 9.0 9.4 9.2 9.7 9.9 9.8   MG 2.0 1.8 1.9  --   --   --   --   --   PHOS 5.2* 3.9 3.4  --  4.7* 4.7*  --  1.2*   Estimated Creatinine Clearance: 31.2 mL/min (A) (by C-G formula based on SCr of 2.61 mg/dL (H)). Liver & Pancreas: Recent Labs  Lab 05/07/24 0415 05/09/24 0830 05/10/24 0600 05/11/24 0000 05/11/24 0500  AST  --   --   --  34  --   ALT  --   --   --  58*  --   ALKPHOS  --   --   --  93  --   BILITOT  --   --   --  0.7  --   PROT  --   --   --  7.0  --   ALBUMIN  2.6* 2.6* 2.9* 3.0* 3.0*   No results for input(s): LIPASE, AMYLASE in the last 168 hours. No results for input(s): AMMONIA in the last 168 hours. Diabetic: No results for input(s): HGBA1C in the last 72 hours. Recent Labs  Lab 05/10/24 1956 05/10/24 2326 05/11/24 0301 05/11/24 0753 05/11/24 1138  GLUCAP 145* 162* 191* 217* 129*   Cardiac Enzymes: No results for input(s): CKTOTAL, CKMB, CKMBINDEX, TROPONINI in the last 168 hours. No results for input(s): PROBNP in the last 8760 hours. Coagulation Profile: No results for input(s): INR, PROTIME in the last 168 hours.  Thyroid  Function Tests: No results for input(s): TSH, T4TOTAL, FREET4, T3FREE, THYROIDAB in the last 72 hours. Lipid Profile: No results for input(s): CHOL, HDL, LDLCALC, TRIG, CHOLHDL, LDLDIRECT in the last 72 hours. Anemia Panel: No results for input(s): VITAMINB12, FOLATE, FERRITIN, TIBC, IRON , RETICCTPCT in the last 72 hours. Urine analysis:    Component Value Date/Time   COLORURINE YELLOW 04/13/2024 1018   APPEARANCEUR CLOUDY (A) 04/13/2024 1018   LABSPEC 1.016 04/13/2024 1018   PHURINE 6.0 04/13/2024 1018   GLUCOSEU NEGATIVE 04/13/2024 1018   HGBUR LARGE (A) 04/13/2024 1018   BILIRUBINUR NEGATIVE 04/13/2024 1018   KETONESUR 5 (A) 04/13/2024 1018   PROTEINUR 100 (A) 04/13/2024 1018   NITRITE NEGATIVE 04/13/2024 1018   LEUKOCYTESUR LARGE (A) 04/13/2024 1018   Sepsis Labs: Invalid input(s):  PROCALCITONIN, LACTICIDVEN  Microbiology: No results found for this or any previous visit (from the past 240 hours).  Radiology Studies: IR NEPHROSTOMY EXCHANGE BILATERAL Result Date: 05/11/2024 INDICATION: Status post prior cystectomy with ileal conduit formation. Recent bowel surgery for bowel perforation. Concern for possible urine leak. EXAM: 1. LEFT PERCUTANEOUS NEPHROSTOMY TUBE EXCHANGE UNDER FLUOROSCOPY INCLUDING FULL ANTEGRADE NEPHROSTOGRAM 2. RIGHT PERCUTANEOUS NEPHROSTOMY TUBE EXCHANGE UNDER FLUOROSCOPY INCLUDING FULL ANTEGRADE NEPHROSTOGRAM COMPARISON:  None Available. MEDICATIONS: None ANESTHESIA/SEDATION: None CONTRAST:  50 mL Omnipaque  300-administered into the collecting system(s) FLUOROSCOPY: Radiation Exposure Index (as provided by the fluoroscopic device): 111 mGy Kerma COMPLICATIONS: None immediate. PROCEDURE: Informed written consent was obtained from the patient after a  thorough discussion of the procedural risks, benefits and alternatives. All questions were addressed. Maximal Sterile Barrier Technique was utilized including caps, mask, sterile gowns, sterile gloves, sterile drape, hand hygiene and skin antiseptic. A timeout was performed prior to the initiation of the procedure. Both percutaneous nephrostomy tubes were injected with contrast material under fluoroscopy and multiple fluoroscopy loops and images were saved. The left percutaneous nephrostomy tube was then removed over a guidewire and exchanged for a 5 French catheter over a wire. The catheter was further advanced into the left ureter and additional nephrostogram performed to assess ureteral patency. A new 10 French percutaneous nephrostomy tube was then formed over a guidewire in the left renal pelvis A new 10 French right percutaneous nephrostomy tube was then exchanged over a guidewire and formed in the renal pelvis. Both nephrostomy tubes were secured at the skin with Prolene retention sutures, adhesive StatLock  devices and attached to new gravity drainage bags. FINDINGS: Injection of the left percutaneous nephrostomy tube demonstrates retraction of the tube which is peripherally located in a lower pole calyx. Contrast injection demonstrates left-sided hydronephrosis and poor drainage into the ileal conduit with suggestion of a distal ureteral stricture. This was confirmed by advancement of a 5 French catheter into the distal ureter with contrast injection demonstrating a high-grade stricture but flow of contrast through the stricture and into the ileal conduit once a catheter was advanced near the level of the stricture. There is no evidence of left-sided urine leak. The left nephrostomy tube was replaced with a new 10 French catheter formed at the level of the renal pelvis. A right nephrostogram demonstrates retraction the right nephrostomy tube into a lower pole calyx. Contrast injection demonstrates good flow of contrast into the right ureter and ileal conduit without evidence of ureteral stricture or urine leak. The right nephrostomy tube was replaced with a new 10 French catheter formed at the level of the renal pelvis. IMPRESSION: 1. 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 Injection of contrast at the level of the distal ureter does show flow through the stricture and into the ileal conduit. No left-sided urine leak demonstrated. 2. Right nephrostogram demonstrates normally patent ureter and ureteral anastomosis with the ileal conduit. No right-sided urine leak demonstrated. 3. Both nephrostomy tubes had retracted into lower pole calices and were replaced with new 10 French catheters formed at the level of the renal pelvis bilaterally. Electronically Signed   By: Marcey Moan M.D.   On: 05/11/2024 12:35       Marticia Reifschneider  [facility]  If 7PM-7AM, please contact night-coverage [email or web address] 05/11/2024, 2:26 PM

## 2024-05-11 NOTE — Progress Notes (Signed)
 Physical Therapy Treatment Patient Details Name: Kristopher Thompson MRN: [id number] DOB: [birth year] Today's Date: 05/11/2024   History of Present Illness 73 y/o M admitted to [facility] 5/30 for hernia repair and urostomy revision. 6/9 to OR for perforated bowel, post op shock, s/p exploratory lapartomy with drainage of Rt abdominal wall abscess. CRRT 6/11-6/14. Extubated 6/14, Transferred to [facility] 6/15 for intermittent HD. CRRT again 6/17-6/20. 6/23 bil perc nephrostomy tubes placed. PMHx: bladder and prostate CA, RBBB, CHF, colostomy, DM, GERD, abdominal surgery.    PT Comments  Pt up in chair upon PT arrival to room, requesting back to bed and transport waiting to take pt to radiology dept. Pt states he feels weaker today vs Friday. Pt requiring mod +2 assist for transfer out of chair back to bed, limited by DOE 2/4 after transfer, SPO2 96% on RA, HR tachycardic to 120s. Pt condition improved with rest. PT to continue to follow.     If plan is discharge home, recommend the following: Assistance with cooking/housework;Assist for transportation;Help with stairs or ramp for entrance;Supervision due to cognitive status;A lot of help with walking and/or transfers;A lot of help with bathing/dressing/bathroom   Can travel by private vehicle        Equipment Recommendations  Rolling walker (2 wheels);BSC/3in1    Recommendations for Other Services       Precautions / Restrictions Precautions Precautions: Fall;Other (comment) Recall of Precautions/Restrictions: Impaired Precaution/Restrictions Comments: bil JP drains, ileostomy, urostomy, cortrak, abd wound vac, bil perc nephrostomy drains Restrictions Weight Bearing Restrictions Per Provider Order: No     Mobility  Bed Mobility Overal bed mobility: Needs Assistance Bed Mobility: Rolling, Sit to Sidelying Rolling: Mod assist       Sit to sidelying: Max assist      Transfers         Stand pivot transfers: Mod assist, +2 physical  assistance Step pivot transfers: Mod assist       General transfer comment: assist for power up from recliner (x2 attempts needed to successfully stand), and pivotal steps to reach bed. Pt fatiguing with increasing hip and knee flexion with each step, requiring PT to support pt at buttocks.    Ambulation/Gait                   Stairs             Wheelchair Mobility     Tilt Bed    Modified Rankin (Stroke Patients Only)       Balance Overall balance assessment: Needs assistance Sitting-balance support: Feet unsupported, No upper extremity supported Sitting balance-Leahy Scale: Fair     Standing balance support: Reliant on assistive device for balance Standing balance-Leahy Scale: Poor                              Communication Communication Communication: No apparent difficulties Factors Affecting Communication: Hearing impaired  Cognition Arousal: Alert Behavior During Therapy: Flat affect   PT - Cognitive impairments: Safety/Judgement                       PT - Cognition Comments: slow processing, complicated by [facility]   Following commands impaired: Follows one step commands with increased time    Cueing Cueing Techniques: Verbal cues, Gestural cues, Tactile cues  Exercises      General Comments General comments (skin integrity, edema, etc.): pt endorsing DOE 2/4 after transfer, SPO2 96% on RA,  HR tachycardic to 120s      Pertinent Vitals/Pain Pain Assessment Pain Assessment: Faces Faces Pain Scale: Hurts little more Pain Location: abd Pain Descriptors / Indicators: Sore, Tender Pain Intervention(s): Limited activity within patient's tolerance, Monitored during session, Repositioned    Home Living                          Prior Function            PT Goals (current goals can now be found in the care plan section) Acute Rehab PT Goals Patient Stated Goal: Regain independence PT Goal Formulation: With  patient Time For Goal Achievement: 05/13/24 Potential to Achieve Goals: Good Progress towards PT goals: Progressing toward goals    Frequency    Min 2X/week      PT Plan      Co-evaluation              AM-PAC PT 6 Clicks Mobility   Outcome Measure  Help needed turning from your back to your side while in a flat bed without using bedrails?: A Lot Help needed moving from lying on your back to sitting on the side of a flat bed without using bedrails?: A Lot Help needed moving to and from a bed to a chair (including a wheelchair)?: A Lot Help needed standing up from a chair using your arms (e.g., wheelchair or bedside chair)?: A Lot Help needed to walk in hospital room?: Total Help needed climbing 3-5 steps with a railing? : Total 6 Click Score: 10    End of Session   Activity Tolerance: Patient tolerated treatment well;Patient limited by fatigue Patient left: with family/visitor present;in chair;with chair alarm set;with call bell/phone within reach Nurse Communication: Mobility status PT Visit Diagnosis: Unsteadiness on feet (R26.81);Other abnormalities of gait and mobility (R26.89);Muscle weakness (generalized) (M62.81);Difficulty in walking, not elsewhere classified (R26.2)     Time: 9076-9059 PT Time Calculation (min) (ACUTE ONLY): 17 min  Charges:    $Therapeutic Activity: 8-22 mins PT General Charges $$ ACUTE PT VISIT: 1 Visit                     Johana RAMAN, PT DPT Acute Rehabilitation Services Secure Chat Preferred  Office [phone]    Chou Busler FORBES [place] 05/11/2024, 9:52 AM

## 2024-05-11 NOTE — Procedures (Addendum)
 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 No ureteral leak. Right PCN pulled back into LP calyx so replaced with new 10 Fr PCN formed in renal pelvis.  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 Once 5 Fr catheter advanced to level of stricture, injection of contrast does show some antegrade flow through stricture and into conduit. No ureteral leak. New left 10 Fr PCN replaced and formed in renal pelvis.  Both PCN's attached to new gravity bags.  Kristopher Thompson. [place], M.D Pager:  [phone]

## 2024-05-11 NOTE — Consult Note (Signed)
 WOC Nurse wound follow up Wound type: surgical Measurement: 18 cm x 5.5 cm x 2 cm  Wound bed: red, moist, adipose and granulation tissue visible Drainage (amount, consistency, odor) serosanguinous in cannister  Periwound: intact Dressing procedure/placement/frequency: Removed old NPWT dressing  Filled wound with   _3___ piece of black foam  Sealed NPWT dressing at HG, barrier ring used at umbilicus to achieve seal Patient tolerated procedure well  WOC nurse will continue to provide NPWT dressing changed due to the complexity of the dressing change.     WOC Nurse ostomy follow up Stoma type/location: RLQ ileal conduit, RUQ ileostomy Stomal assessment/size:  see prior note dated 6/25 Peristomal assessment: intact Treatment options for stomal/peristomal skin: 2 inch barrier ring  Output  Clear urine Liquid brown stool Ostomy pouching: 1pc. Soft convex urostomy and ileostomy pouch Education provided: Noted continued issues with leaking from ileostomy requiring 3 pouch changes over the weekend.  Spouse and patient are familiar with pouching procedure.  Utilized hypo-fix tape to act as a barrier extender to address lifting at edges of skin barrier. Enrolled patient in [employer] Discharge program: No    WOC team will continue to provide NPWT dressing changed due to the complexity of the dressing change and ostomy teaching as needed.   Doyal Polite, RN, MSN, [facility] WOC Team

## 2024-05-11 NOTE — Progress Notes (Signed)
 PHARMACY - ANTICOAGULATION CONSULT NOTE  Pharmacy Consult for Heparin  Indication: h/o PE  Allergies  Allergen Reactions   Demerol  [Meperidine ] Nausea And Vomiting and Other (See Comments)    SEVERE NAUSEA    Patient Measurements: Height: 5' 11 (180.3 cm) Weight: 99.3 kg (218 lb 14.7 oz) IBW/kg (Calculated) : 75.3 HEPARIN  DW (KG): 100.8  Vital Signs: Temp: 97.7 F (36.5 C) (06/30 0740) Temp Source: Axillary (06/30 0740) BP: 109/70 (06/30 0740) Pulse Rate: 96 (06/30 0740)  Labs: Recent Labs    05/09/24 1351 05/10/24 0600 05/11/24 0000 05/11/24 0500  HGB 10.2* 10.5*  --  10.9*  HCT 31.5* 32.8*  --  34.2*  PLT 124* 131*  --  145*  HEPARINUNFRC 0.52 0.52  --  0.27*  CREATININE  --  2.29* 2.52* 2.61*    Estimated Creatinine Clearance: 31.2 mL/min (A) (by C-G formula based on SCr of 2.61 mg/dL (H)).  Assessment:  Patient with prolonged hospitalization including post-op complications resulting in bowel perforation with open abdomen. Has been closed and transferred to [facility]. On Eliquis PTA for hx of PE. Has been on heparin  gtt since 6/6.   Heparin  levels have been therapeutic and stable, but is slightly sub-therapeutic today.  RN reports having to restart heparin  infusion several times overnight. CBC stable/improving, no bleeding reported.  Goal of Therapy:  Heparin  level 0.3-0.7 units/ml Monitor platelets by anticoagulation protocol: Yes   Plan:  Continue heparin  gtt at 1900 units/hr - adjust if level remains low in AM Daily heparin  level and CBC daily Monitor for s/sx of bleeding Continue to hold DOAC because patient might still need a G-tube  Kmya Placide D. Lendell, PharmD, BCPS, BCCCP 05/11/2024, 10:26 AM

## 2024-05-11 NOTE — Progress Notes (Signed)
 Occupational Therapy Treatment Patient Details Name: GENARO BEKKER MRN: [id number] DOB: 1951/06/12 Today's Date: 05/11/2024   History of present illness 73 y/o M admitted to [facility] 5/30 for hernia repair and urostomy revision. 6/9 to OR for perforated bowel, post op shock, s/p exploratory lapartomy with drainage of Rt abdominal wall abscess. CRRT 6/11-6/14. Extubated 6/14, Transferred to [facility] 6/15 for intermittent HD. CRRT again 6/17-6/20. 6/23 bil perc nephrostomy tubes placed. PMHx: bladder and prostate CA, RBBB, CHF, colostomy, DM, GERD, abdominal surgery.   OT comments  Patient feeling much more fatigued this session.  Patient needing a little more assist with bed mobility, sit to stand, but once on his feet, Min A for simple transfers at RW level.  Max A for to donn socks.  Patient POC reviewed and goals advanced minimally.  OT will continue efforts in the acute setting and Patient will benefit from continued inpatient follow up therapy, <3 hours/day.      If plan is discharge home, recommend the following:  Two people to help with walking and/or transfers;Two people to help with bathing/dressing/bathroom;Assistance with cooking/housework;Assistance with feeding;Direct supervision/assist for medications management;Direct supervision/assist for financial management;Assist for transportation;Help with stairs or ramp for entrance   Equipment Recommendations  Other (comment)    Recommendations for Other Services      Precautions / Restrictions Precautions Precautions: Fall;Other (comment) Recall of Precautions/Restrictions: Impaired Precaution/Restrictions Comments: bil JP drains, ileostomy, urostomy, cortrak, abd wound vac, bil perc nephrostomy drains Restrictions Weight Bearing Restrictions Per Provider Order: No       Mobility Bed Mobility Overal bed mobility: Needs Assistance Bed Mobility: Supine to Sit     Supine to sit: Mod assist          Transfers Overall transfer  level: Needs assistance Equipment used: Rolling walker (2 wheels) Transfers: Sit to/from Stand, Bed to chair/wheelchair/BSC Sit to Stand: Mod assist Stand pivot transfers: Min assist, Mod assist               Balance Overall balance assessment: Needs assistance Sitting-balance support: Feet unsupported, No upper extremity supported Sitting balance-Leahy Scale: Fair     Standing balance support: Reliant on assistive device for balance Standing balance-Leahy Scale: Poor                             ADL either performed or assessed with clinical judgement   ADL                       Lower Body Dressing: Maximal assistance;Sitting/lateral leans;Sit to/from stand   Toilet Transfer: Moderate assistance;Stand-pivot;BSC/3in1   Toileting- Clothing Manipulation and Hygiene: Total assistance;Sit to/from stand              Extremity/Trunk Assessment Upper Extremity Assessment Upper Extremity Assessment: Generalized weakness   Lower Extremity Assessment Lower Extremity Assessment: Defer to PT evaluation   Cervical / Trunk Assessment Cervical / Trunk Assessment: Kyphotic    Vision Patient Visual Report: No change from baseline     Perception Perception Perception: Not tested   Praxis Praxis Praxis: Not tested   Communication Communication Communication: No apparent difficulties   Cognition Arousal: Alert Behavior During Therapy: Flat affect Cognition: Cognition impaired                                 Following commands impaired: Follows one step commands with increased time  Cueing   Cueing Techniques: Verbal cues, Gestural cues, Tactile cues  Exercises      Shoulder Instructions       General Comments  HR to 122    Pertinent Vitals/ Pain       Pain Assessment Pain Assessment: Faces Faces Pain Scale: Hurts little more Pain Location: abdomen Pain Descriptors / Indicators: Sore, Tender Pain Intervention(s):  Monitored during session                                                          Frequency  Min 2X/week        Progress Toward Goals  OT Goals(current goals can now be found in the care plan section)  Progress towards OT goals: Progressing toward goals  Acute Rehab OT Goals OT Goal Formulation: With patient Time For Goal Achievement: 05/25/24 Potential to Achieve Goals: Fair  Plan      Co-evaluation                 AM-PAC OT 6 Clicks Daily Activity     Outcome Measure   Help from another person eating meals?: A Little Help from another person taking care of personal grooming?: A Little Help from another person toileting, which includes using toliet, bedpan, or urinal?: A Lot Help from another person bathing (including washing, rinsing, drying)?: A Lot Help from another person to put on and taking off regular upper body clothing?: A Lot Help from another person to put on and taking off regular lower body clothing?: A Lot 6 Click Score: 14    End of Session Equipment Utilized During Treatment: Rolling walker (2 wheels)  OT Visit Diagnosis: Unsteadiness on feet (R26.81);Other abnormalities of gait and mobility (R26.89);Muscle weakness (generalized) (M62.81)   Activity Tolerance Patient tolerated treatment well   Patient Left in chair;with call bell/phone within reach;with chair alarm set   Nurse Communication Mobility status        Time: 9169-9143 OT Time Calculation (min): 26 min  Charges: OT General Charges $OT Visit: 1 Visit OT Treatments $Self Care/Home Management : 8-22 mins $Therapeutic Activity: 8-22 mins  05/11/2024  RP, OTR/L  Acute Rehabilitation Services  Office:  [phone]   [name] 05/11/2024, 9:31 AM

## 2024-05-11 NOTE — Progress Notes (Addendum)
 05/11/2024  Kristopher Thompson [id number] 10/30/1951  CARE TEAM: PCP: [name], MD  Outpatient Care Team: Patient Care Team: [name], MD as PCP - General (Internal Medicine) [name], MD (Inactive) as Consulting Physician (Dermatology) [name], MD as Referring Physician (Pulmonary Disease) [name], MD as Consulting Physician (General Surgery) [name], MD as Consulting Physician (Urology)  Inpatient Treatment Team: Treatment Team:  [name], MD [name], MD [name], MD Massie Delaine SAUNDERS, RN Dea Shiner, MD Ruthellen [facility]hellen [facility], MD Bobbette Dar, MD Theophilus Caren FORBES, RN Stroup, Johana FORBES, PT Popella, Charlie BIRCH, OT Lendell Serum D, [facility] [name], RN Rayburn Pac, MD   Problem List:   Principal Problem:   Delayed bowel perforation s/p SBR/end ileostomy Active Problems:   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 08/08/2017   S/P ileal conduit (HCC)   GERD (gastroesophageal reflux disease)   Obesity (BMI 35.0-39.9 without comorbidity)   Prolonged QT interval   Non-recurrent bilateral inguinal hernia without obstruction or gangrene   Incarcerated incisional hernia   Sinus tachycardia   Tachypnea   Acute respiratory insufficiency, postoperative   Sepsis due to undetermined organism (HCC)   Lactic acidosis   Class 2 obesity   Chronic anticoagulation   Hearing loss   History of bladder cancer   Obstructive sleep apnea of adult   Parastomal hernia of ileal conduit   Partial small bowel obstruction (HCC)   Personal history of PE (pulmonary embolism)   Pressure injury of skin   04/10/2024  POST-OPERATIVE DIAGNOSIS:  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   PROCEDURE:   -ROBOTIC LYSIS OF ADHESIONS X 4 HOURS -COMPONENT SEPARATION - TRANSVERSUS ABDOMINIS REALEASE (TAR) BILATERAL -ROBOTIC REPAIRS OF  INCISIONAL, PARASTOMAL, LEFT INGUINAL   INCARCERATED ABDOMINAL WALL HERNIAS WITH MESH -UROSTOMY ILEAL CONDUIT REVISION -INTRAOPERATIVE ASSESSMENT OF TISSUE VASCULAR PERFUSION USING ICG (indocyanine green ) -IMMUNOFLUORESCENCE -TRANSVERSUS ABDOMINIS PLANE (TAP) BLOCK - BILATERAL    SURGEON:  [name], MD   04/20/2024 POST-OPERATIVE DIAGNOSIS:  perforated bowel   PROCEDURE:  Drainage of right abdominal wall abscess as well as intra-abdominal abscess Explantation of abdominal mesh Small bowel resection (in discontinuity) Placement of ABThera wound VAC   SURGEON:  [name], MD  04/21/2024  POST-OPERATIVE DIAGNOSIS:  DELAYED BOWEL PERFORATION WITH OPEN ABDOMEN   PROCEDURE:   LYSIS OF ADHESIONS X ABDOMINAL WALL DEBRIDEMENT ILEAL RESECTION END ILEOSTOMY ABDOMINAL WALL PARASTOMAL & INCISIONAL HERNIA REPAIRS WITH PHASIX MESH FASCIA CLOSURE WITH WOUND VAC PLACEMENT IN SQ   SURGEON:  [name], MD    05/04/2024 Interventional Radiology Procedure Note   Procedure: Bilateral percutaneous nephrostomy tube placements   Findings: Please refer to procedural dictation for full description. 10 Fr bilateral to bag drainage.   Complications: None immediate   Estimated Blood Loss: < 5 ml   Recommendations: Keep to bag drainage.  IR will follow.  Ultimate management as per Urology.   Assessment  -Incarcerated parastomal incisional hernias with obstructive symptoms status post robotic takedown and repair -Delayed small bowel perforation s/p ileal resection and end ileostomy -Delayed urine leak of urostomy ileal conduit status post perc nephrostomy tubes -Multisystem organ failure gradually resolving  SLOWLY IMPROVING   Plan:  Delayed urine leak with AKI:  Urinary diversion with percutaneous nephrostomy tubes.  Confusing picture with urine output out 3 places: urostomy, ileal conduit, abdominal wall right lower quadrant surgical drain, and  right nephrostomy tube.  Most out the surgical drain.  I  am concerned that urine is not probably diverted.  I had a discussion with Dr. Renda in person this morning.  I agree to have interventional radiology IR investigate the nephrostomy tubes today.  Make sure the right nephrostomy tube is not dislodged.  See if it is possible do an antegrade nephrogram to evaluate source of the leak (uretero-ileal anastomosis, mid ileal urostomy conduit, etc) and perhaps better control it with stenting - defer to Urology & IR  Still uremic but nonoliguric.  Nephrology signed off 6/26.  May need them back on board.  Patient mentally stable right now so probably does not need intermittent dialysis.  We will see.    No strong need repeat CAT scan of abdomen pelvis unless IR interventional wants that with the nephrograms.    Nutrition:  Regular diet as tolerated.  Speech therapy concurs  Patient much more alert.  Thinks he is eating about 40% of his meals.  Reduce calorie counts.  Cycle tube feeds.  Go down to 8 hours.  80 mL.  That provides about 60%.  If he has decent oral intake, stop food feeds and remove feeding tube 7/2.  We will see.  Bowel regimen for ileostomy.  soluble polyfiber/FiberCon to thicken with nightly loperamide  seems to be working   [employer] 6/16   Hold off on G-tube unless poor PO effort >14days  Infection due to delayed bowel perforation and abdominal wall infection/necrosis  Growing multiple organisms including Candida albicans, Enterobacter, and Enterococcus.    IV Zosyn /micafungin .  Wonder if we can switch to an enteral route at some point  Anticipate prolonged antibiotics given the abdominal wall infection/necrosis and need for Phasix mesh.   Change wound vac q MonWedFri the hopes for the subcutaneous tissues to recover.  Last change 6/25 showing early granulation without necrosis guardedly reassuring.      Metabolic encephalopathy slowly resolving.  Follow.  Insomnia a challenge.  Hopefully if we can get the Corpak  out, that will help.  I will restart some melatonin.  See what internal medicine thinks.  Patient wife are against doing Ambien .  ABLA on top of anemia of chronic disease improved with transfusion.  Follow.    -monitor electrolytes & replace as needed.  Keep K>4, Mg>2, Phos>3.    -Diabetes.  Sliding scale insulin .    -VTE prophylaxis-  Full anticoagulation heparin  drip given history of pulmonary embolism,.  Could consider back on DOAC enteral route once procedures done.  Perhaps day after nephrostograms done   -Mobilize as tolerated.  Starting to work with physical and Occupational Therapy more.  Physical therapy in room.  Hopefully can rebuild strength and minimize further atrophy/decline.   I updated the patient's status to the the patient & ICU RN in room.  Discussed with Dr. Annella with critical care.  Recommendations were made.  Questions were answered.  They expressed understanding & appreciation.  -Disposition: Possible transition to inpatient rehab if makes goals.  I would hold off on LTAC.     I reviewed nursing notes, last 24 h vitals and pain scores, last 48 h intake and output, last 24 h labs and trends, and last 24 h imaging results.  I have reviewed this patient's available data, including medical history, events of note, test results, etc as part of my evaluation.   A significant portion of that time was spent in counseling. Care during the described time interval was provided by me.  This care required high  level of medical  decision making.  05/11/2024    Subjective: (Chief complaint)  Patient much more alert.  Eating about 40% of his meals according to him.  Wife in room.  Denies much pain.  Biggest issue is insomnia.  Objective:  Vital signs:  Vitals:   05/10/24 1949 05/10/24 2330 05/11/24 0300 05/11/24 0740  BP: 105/66 128/77 127/77 109/70  Pulse: (!) 110 (!) 104 (!) 105 96  Resp: 17 18 17 15   Temp: 98.3 F (36.8 C) 98.4 F (36.9 C) 97.8 F  (36.6 C) 97.7 F (36.5 C)  TempSrc: Oral Axillary Axillary Axillary  SpO2: 97% 96% 97% 96%  Weight:      Height:        Last BM Date : 05/10/24  Intake/Output   Yesterday:  06/29 0701 - 06/30 0700 In: 4089.9 [P.O.:240; I.V.:185.4; NG/GT:3473.3; IV Piggyback:191.2] Out: 2785 [Urine:150; Drains:2110; Stool:525] This shift:  No intake/output data recorded.  Bowel function:  Flatus: YES  BM:  YES -small  Drains:  RLQ drain (rests between abdominal wall and Phasix mesh): Serous moderate volume.  LLQ drain (runs over bowel with tip down in RLQ pelvis near base of ileal conduit and site of bowel resection): Serous   Nephrostomy tubes in place.  Right light tea colored low volume..  & Left light yellow-colored  Physical Exam:  General: Resting in no acute distress.  Legs crossed looking relaxed.  Mentally sharp and back to his baseline Eyes: PERRL, normal EOM.  Sclera clear.  No icterus Neuro: CN II-XII intact w/o focal sensory/motor deficits. Lymph: No head/neck/groin lymphadenopathy Psych:  No delerium/psychosis/paranoia.  No agitation HENT: Normocephalic, Mucus membranes moist.  No thrush.  ETT & NGT in place Neck: Supple, No tracheal deviation.  No obvious thyromegaly Chest: No pain to chest wall compression.  Good respiratory excursion.   CV:  Pulses intact.  Regular rhythm.  1-2+ BUE/BLE edema MS:  No obvious deformity  Abdomen:  Obese Soft.  Nondistended.  Nontender Wound vac (in SQ over closed fascia) in midline.    RUQ: (End ileostomy): Pink mucosa.  Brown oatmeal thick effluent in bag RLQ:  (Urostomy ileal conduit): Pink mucosa without any obvious separation.  Moderate clear light yellow-colored urine  Ext:   No deformity.  Bilateral hand 0-1+ edema proved.  No lower extremity edema.  No cyanosis Skin: No petechiae / purpurea.  No major sores.  Warm and dry    Results:   Cultures: Recent Results (from the past 720 hours)  Culture, blood (Routine X 2) w  Reflex to ID Panel     Status: None   Collection Time: 04/13/24  7:13 AM   Specimen: BLOOD  Result Value Ref Range Status   Specimen Description   Final    BLOOD BLOOD LEFT ARM AEROBIC BOTTLE ONLY ANAEROBIC BOTTLE ONLY Performed at [facility], [address] [place], [place] [address]    Special Requests   Final    BOTTLES DRAWN AEROBIC AND ANAEROBIC Blood Culture results may not be optimal due to an inadequate volume of blood received in culture bottles Performed at [facility], [address], [place], [place] [address]    Culture   Final    NO GROWTH 5 DAYS Performed at [facility], [address] [place], [place] [address]    Report Status 04/18/2024 FINAL  Final  Culture, blood (Routine X 2) w Reflex to ID Panel     Status: None   Collection Time: 04/13/24  7:20 AM   Specimen: BLOOD  Result Value Ref Range Status   Specimen Description   Final    BLOOD BLOOD RIGHT ARM AEROBIC BOTTLE ONLY ANAEROBIC BOTTLE ONLY Performed at [facility], [address] [place], [place] [address]    Special Requests   Final    BOTTLES DRAWN AEROBIC AND ANAEROBIC Blood Culture results may not be optimal due to an inadequate volume of blood received in culture bottles Performed at [facility], [address] [place], [place] [address]    Culture   Final    NO GROWTH 5 DAYS Performed at [facility], [address] [place], [place] [address]    Report Status 04/18/2024 FINAL  Final  Urine Culture     Status: None   Collection Time: 04/13/24 10:18 AM   Specimen: Urine, Clean Catch  Result Value Ref Range Status   Specimen Description   Final    URINE, CLEAN CATCH Performed at [facility], [address], [place], [place] [address]    Special Requests   Final    NONE Performed at [facility], [address] [place], [place] [address]    Culture   Final     NO GROWTH Performed at [facility], [address], [place], [place] [address]    Report Status 04/14/2024 FINAL  Final  MRSA Next Gen by PCR, Nasal     Status: None   Collection Time: 04/13/24 11:35 AM   Specimen: Nasal Mucosa; Nasal Swab  Result Value Ref Range Status   MRSA by PCR Next Gen NOT DETECTED NOT DETECTED Final    Comment: (NOTE) The GeneXpert MRSA Assay (FDA approved for NASAL specimens only), is one component of a comprehensive MRSA colonization surveillance program. It is not intended to diagnose MRSA infection nor to guide or monitor treatment for MRSA infections. Test performance is not FDA approved in patients less than 41 years old. Performed at [facility], [address] [place], [place] [address]   Culture, blood (Routine X 2) w Reflex to ID Panel     Status: None   Collection Time: 04/20/24  8:46 AM   Specimen: BLOOD RIGHT ARM  Result Value Ref Range Status   Specimen Description   Final    BLOOD RIGHT ARM Performed at [facility], [address] [place], [place] [address]    Special Requests   Final    BOTTLES DRAWN AEROBIC AND ANAEROBIC Blood Culture results may not be optimal due to an inadequate volume of blood received in culture bottles Performed at [facility], [address] [place], [place] [address]    Culture   Final    NO GROWTH 5 DAYS Performed at [facility], [address], [place], [place] [address]    Report Status 04/25/2024 FINAL  Final  Culture, blood (Routine X 2) w Reflex to ID Panel     Status: None   Collection Time: 04/20/24  8:49 AM   Specimen: BLOOD  Result Value Ref Range Status   Specimen Description   Final    BLOOD Performed at [facility], [address] [place], [place] [address]    Special Requests   Final    BOTTLES DRAWN AEROBIC AND ANAEROBIC Blood Culture results may not be optimal due to an inadequate volume of blood received in culture  bottles Performed at [facility], [address], [place], [place] [address]    Culture   Final    NO GROWTH 5 DAYS Performed at [facility], [address] [place], [place] [address]    Report Status 04/25/2024 FINAL  Final  Culture, Respiratory w Gram Stain     Status: None   Collection Time: 04/20/24 12:03 PM   Specimen: Tracheal Aspirate; Respiratory  Result Value Ref Range Status   Specimen Description   Final    TRACHEAL ASPIRATE Performed at [facility], [address], [place], [place] [address]    Special Requests   Final    NONE Performed at [facility], [address], [place], [place] [address]    Gram Stain NO WBC SEEN RARE GRAM POSITIVE COCCI   Final   Culture   Final    RARE Normal respiratory flora-no Staph aureus or Pseudomonas seen Performed at [facility], [address], [place], [place] [address]    Report Status 04/23/2024 FINAL  Final  Aerobic/Anaerobic Culture w Gram Stain (surgical/deep wound)     Status: None   Collection Time: 04/21/24  2:23 PM   Specimen: Soft Tissue, Other  Result Value Ref Range Status   Specimen Description   Final    TISSUE INTRA ABDOMINAL NECROTIC TISSUE Performed at [facility], [address], [place], [place] [address]    Special Requests   Final    NONE Performed at [facility], [address], [place], [place] [address]    Gram Stain   Final    FEW WBC PRESENT,BOTH PMN AND MONONUCLEAR RARE GRAM POSITIVE COCCI IN PAIRS AND CHAINS RARE YEAST    Culture   Final    RARE ESCHERICHIA COLI FEW CANDIDA ALBICANS FEW ENTEROCOCCUS FAECALIS FEW BACTEROIDES SPECIES BETA LACTAMASE POSITIVE Performed at [facility], [address], [place], [place] [address]    Report Status 04/25/2024 FINAL  Final   Organism ID, Bacteria ESCHERICHIA COLI  Final   Organism ID, Bacteria ENTEROCOCCUS FAECALIS  Final       Susceptibility   Escherichia coli - MIC*    AMPICILLIN >=32 RESISTANT Resistant     CEFEPIME  <=0.12 SENSITIVE Sensitive     CEFTAZIDIME <=1 SENSITIVE Sensitive     CEFTRIAXONE  1 SENSITIVE Sensitive     CIPROFLOXACIN  <=0.25 SENSITIVE Sensitive     GENTAMICIN  <=1 SENSITIVE Sensitive     IMIPENEM <=0.25 SENSITIVE Sensitive     TRIMETH /SULFA  <=20 SENSITIVE Sensitive     AMPICILLIN/SULBACTAM >=32 RESISTANT Resistant     PIP/TAZO 8 SENSITIVE Sensitive ug/mL    * RARE ESCHERICHIA COLI   Enterococcus faecalis - MIC*    AMPICILLIN <=2 SENSITIVE Sensitive     VANCOMYCIN  1 SENSITIVE Sensitive     GENTAMICIN  SYNERGY SENSITIVE Sensitive     * FEW ENTEROCOCCUS FAECALIS    Labs: Results for orders placed or performed during the hospital encounter of 04/10/24 (from the past 48 hours)  Renal function panel     Status: Abnormal   Collection Time: 05/09/24  8:30 AM  Result Value Ref Range   Sodium 132 (L) 135 - 145 mmol/L   Potassium 4.6 3.5 - 5.1 mmol/L   Chloride 98 98 - 111 mmol/L   CO2 20 (L) 22 - 32 mmol/L   Glucose, Bld 139 (H) 70 - 99 mg/dL    Comment: Glucose reference range applies only to samples taken after fasting for  at least 8 hours.   BUN 89 (H) 8 - 23 mg/dL   Creatinine, Ser 7.15 (H) 0.61 - 1.24 mg/dL   Calcium  9.2 8.9 - 10.3 mg/dL   Phosphorus 4.7 (H) 2.5 - 4.6 mg/dL   Albumin  2.6 (L) 3.5 - 5.0 g/dL   GFR, Estimated 23 (L) >60 mL/min    Comment: (NOTE) Calculated using the CKD-EPI Creatinine Equation (2021)    Anion gap 14 5 - 15    Comment: Performed at [facility], [address] [place], [place] [address]  Glucose, capillary     Status: Abnormal   Collection Time: 05/09/24 11:31 AM  Result Value Ref Range   Glucose-Capillary 133 (H) 70 - 99 mg/dL    Comment: Glucose reference range applies only to samples taken after fasting for at least 8 hours.  Heparin  level (unfractionated)     Status: None   Collection Time: 05/09/24  1:51 PM  Result Value Ref Range    Heparin  Unfractionated 0.52 0.30 - 0.70 IU/mL    Comment: (NOTE) The clinical reportable range upper limit is being lowered to >1.10 to align with the FDA approved guidance for the current laboratory assay.  If heparin  results are below expected values, and patient dosage has  been confirmed, suggest follow up testing of antithrombin III levels. Performed at [facility], [address], [place], [place] [address]   CBC     Status: Abnormal   Collection Time: 05/09/24  1:51 PM  Result Value Ref Range   WBC 11.5 (H) 4.0 - 10.5 K/uL   RBC 3.45 (L) 4.22 - 5.81 MIL/uL   Hemoglobin 10.2 (L) 13.0 - 17.0 g/dL   HCT 68.4 (L) 60.9 - 47.9 %   MCV 91.3 80.0 - 100.0 fL   MCH 29.6 26.0 - 34.0 pg   MCHC 32.4 30.0 - 36.0 g/dL   RDW 81.2 (H) 88.4 - 84.4 %   Platelets 124 (L) 150 - 400 K/uL   nRBC 0.0 0.0 - 0.2 %    Comment: Performed at [facility], [address] [place], [place] [address]  Glucose, capillary     Status: Abnormal   Collection Time: 05/09/24  4:15 PM  Result Value Ref Range   Glucose-Capillary 118 (H) 70 - 99 mg/dL    Comment: Glucose reference range applies only to samples taken after fasting for at least 8 hours.  Glucose, capillary     Status: Abnormal   Collection Time: 05/09/24  7:49 PM  Result Value Ref Range   Glucose-Capillary 155 (H) 70 - 99 mg/dL    Comment: Glucose reference range applies only to samples taken after fasting for at least 8 hours.  Glucose, capillary     Status: Abnormal   Collection Time: 05/09/24 11:38 PM  Result Value Ref Range   Glucose-Capillary 137 (H) 70 - 99 mg/dL    Comment: Glucose reference range applies only to samples taken after fasting for at least 8 hours.  Glucose, capillary     Status: Abnormal   Collection Time: 05/10/24  4:37 AM  Result Value Ref Range   Glucose-Capillary 155 (H) 70 - 99 mg/dL    Comment: Glucose reference range applies only to samples taken after fasting for at least 8 hours.  Heparin  level  (unfractionated)     Status: None   Collection Time: 05/10/24  6:00 AM  Result Value Ref Range   Heparin  Unfractionated 0.52 0.30 - 0.70 IU/mL  Comment: (NOTE) The clinical reportable range upper limit is being lowered to >1.10 to align with the FDA approved guidance for the current laboratory assay.  If heparin  results are below expected values, and patient dosage has  been confirmed, suggest follow up testing of antithrombin III levels. Performed at [facility], [address], [place], [place] [address]   Renal function panel     Status: Abnormal   Collection Time: 05/10/24  6:00 AM  Result Value Ref Range   Sodium 132 (L) 135 - 145 mmol/L   Potassium 4.8 3.5 - 5.1 mmol/L   Chloride 100 98 - 111 mmol/L   CO2 18 (L) 22 - 32 mmol/L   Glucose, Bld 100 (H) 70 - 99 mg/dL    Comment: Glucose reference range applies only to samples taken after fasting for at least 8 hours.   BUN 89 (H) 8 - 23 mg/dL   Creatinine, Ser 7.70 (H) 0.61 - 1.24 mg/dL   Calcium  9.7 8.9 - 10.3 mg/dL   Phosphorus 4.7 (H) 2.5 - 4.6 mg/dL   Albumin  2.9 (L) 3.5 - 5.0 g/dL   GFR, Estimated 30 (L) >60 mL/min    Comment: (NOTE) Calculated using the CKD-EPI Creatinine Equation (2021)    Anion gap 14 5 - 15    Comment: Performed at [facility], [address], [place], [place] [address]  CBC     Status: Abnormal   Collection Time: 05/10/24  6:00 AM  Result Value Ref Range   WBC 11.3 (H) 4.0 - 10.5 K/uL   RBC 3.48 (L) 4.22 - 5.81 MIL/uL   Hemoglobin 10.5 (L) 13.0 - 17.0 g/dL   HCT 67.1 (L) 60.9 - 47.9 %   MCV 94.3 80.0 - 100.0 fL   MCH 30.2 26.0 - 34.0 pg   MCHC 32.0 30.0 - 36.0 g/dL   RDW 80.8 (H) 88.4 - 84.4 %   Platelets 131 (L) 150 - 400 K/uL   nRBC 0.0 0.0 - 0.2 %    Comment: Performed at [facility], [address], [place], [place] [address]  Glucose, capillary     Status: None   Collection Time: 05/10/24  7:39 AM  Result Value Ref Range   Glucose-Capillary 87 70 - 99 mg/dL     Comment: Glucose reference range applies only to samples taken after fasting for at least 8 hours.  Glucose, capillary     Status: Abnormal   Collection Time: 05/10/24 12:00 PM  Result Value Ref Range   Glucose-Capillary 125 (H) 70 - 99 mg/dL    Comment: Glucose reference range applies only to samples taken after fasting for at least 8 hours.  Glucose, capillary     Status: Abnormal   Collection Time: 05/10/24  3:36 PM  Result Value Ref Range   Glucose-Capillary 144 (H) 70 - 99 mg/dL    Comment: Glucose reference range applies only to samples taken after fasting for at least 8 hours.  Glucose, capillary     Status: Abnormal   Collection Time: 05/10/24  7:56 PM  Result Value Ref Range   Glucose-Capillary 145 (H) 70 - 99 mg/dL    Comment: Glucose reference range applies only to samples taken after fasting for at least 8 hours.  Glucose, capillary     Status: Abnormal   Collection Time: 05/10/24 11:26 PM  Result Value Ref Range   Glucose-Capillary 162 (H) 70 - 99 mg/dL    Comment: Glucose reference range applies  only to samples taken after fasting for at least 8 hours.  Prealbumin     Status: Abnormal   Collection Time: 05/11/24 12:00 AM  Result Value Ref Range   Prealbumin 40 (H) 18 - 38 mg/dL    Comment: Performed at [facility], [address], [place], [place] [address]  Comprehensive metabolic panel with GFR     Status: Abnormal   Collection Time: 05/11/24 12:00 AM  Result Value Ref Range   Sodium 133 (L) 135 - 145 mmol/L   Potassium 4.4 3.5 - 5.1 mmol/L   Chloride 100 98 - 111 mmol/L   CO2 20 (L) 22 - 32 mmol/L   Glucose, Bld 147 (H) 70 - 99 mg/dL    Comment: Glucose reference range applies only to samples taken after fasting for at least 8 hours.   BUN 96 (H) 8 - 23 mg/dL   Creatinine, Ser 7.47 (H) 0.61 - 1.24 mg/dL   Calcium  9.9 8.9 - 10.3 mg/dL   Total Protein 7.0 6.5 - 8.1 g/dL   Albumin  3.0 (L) 3.5 - 5.0 g/dL   AST 34 15 - 41 U/L   ALT 58 (H) 0 - 44 U/L    Alkaline Phosphatase 93 38 - 126 U/L   Total Bilirubin 0.7 0.0 - 1.2 mg/dL   GFR, Estimated 27 (L) >60 mL/min    Comment: (NOTE) Calculated using the CKD-EPI Creatinine Equation (2021)    Anion gap 13 5 - 15    Comment: Performed at [facility], [address] [place], [place] [address]  Glucose, capillary     Status: Abnormal   Collection Time: 05/11/24  3:01 AM  Result Value Ref Range   Glucose-Capillary 191 (H) 70 - 99 mg/dL    Comment: Glucose reference range applies only to samples taken after fasting for at least 8 hours.  Heparin  level (unfractionated)     Status: Abnormal   Collection Time: 05/11/24  5:00 AM  Result Value Ref Range   Heparin  Unfractionated 0.27 (L) 0.30 - 0.70 IU/mL    Comment: (NOTE) The clinical reportable range upper limit is being lowered to >1.10 to align with the FDA approved guidance for the current laboratory assay.  If heparin  results are below expected values, and patient dosage has  been confirmed, suggest follow up testing of antithrombin III levels. Performed at [facility], [address] [place], [place] [address]   Renal function panel     Status: Abnormal   Collection Time: 05/11/24  5:00 AM  Result Value Ref Range   Sodium 132 (L) 135 - 145 mmol/L   Potassium 4.9 3.5 - 5.1 mmol/L   Chloride 99 98 - 111 mmol/L   CO2 19 (L) 22 - 32 mmol/L   Glucose, Bld 183 (H) 70 - 99 mg/dL    Comment: Glucose reference range applies only to samples taken after fasting for at least 8 hours.   BUN 95 (H) 8 - 23 mg/dL   Creatinine, Ser 7.38 (H) 0.61 - 1.24 mg/dL   Calcium  9.8 8.9 - 10.3 mg/dL   Phosphorus 1.2 (L) 2.5 - 4.6 mg/dL   Albumin  3.0 (L) 3.5 - 5.0 g/dL   GFR, Estimated 25 (L) >60 mL/min    Comment: (NOTE) Calculated using the CKD-EPI Creatinine Equation (2021)    Anion gap 14 5 - 15    Comment: Performed at [facility], [address] [place], [place] [address]  CBC  Status: Abnormal   Collection Time: 05/11/24   5:00 AM  Result Value Ref Range   WBC 9.4 4.0 - 10.5 K/uL   RBC 3.70 (L) 4.22 - 5.81 MIL/uL   Hemoglobin 10.9 (L) 13.0 - 17.0 g/dL   HCT 65.7 (L) 60.9 - 47.9 %   MCV 92.4 80.0 - 100.0 fL   MCH 29.5 26.0 - 34.0 pg   MCHC 31.9 30.0 - 36.0 g/dL   RDW 81.1 (H) 88.4 - 84.4 %   Platelets 145 (L) 150 - 400 K/uL   nRBC 0.0 0.0 - 0.2 %    Comment: Performed at [facility], [address] [place], [place] [address]  Glucose, capillary     Status: Abnormal   Collection Time: 05/11/24  7:53 AM  Result Value Ref Range   Glucose-Capillary 217 (H) 70 - 99 mg/dL    Comment: Glucose reference range applies only to samples taken after fasting for at least 8 hours.    Imaging / Studies: DG CHEST PORT 1 VIEW Result Date: 05/09/2024 CLINICAL DATA:  10026 Shortness of breath 10026 EXAM: PORTABLE CHEST - 1 VIEW COMPARISON:  04/22/2024 FINDINGS: Interval extubation. Feeding tube placement at least as far as the stomach, tip not seen. Stable right arm PICC. Interval removal of left IJ dialysis catheter. Lungs clear. Heart size and mediastinal contours are within normal limits. No effusion. Visualized bones unremarkable. IMPRESSION: Interval extubation. No acute cardiopulmonary disease. Electronically Signed   By: JONETTA Faes M.D.   On: 05/09/2024 11:53        Medications / Allergies: per chart  Antibiotics: Anti-infectives (From admission, onward)    Start     Dose/Rate Route Frequency Ordered Stop   05/06/24 1400  piperacillin -tazobactam (ZOSYN ) IVPB 3.375 g        3.375 g 12.5 mL/hr over 240 Minutes Intravenous Every 8 hours 05/06/24 1227     05/01/24 1400  piperacillin -tazobactam (ZOSYN ) IVPB 2.25 g  Status:  Discontinued        2.25 g 100 mL/hr over 30 Minutes Intravenous Every 8 hours 05/01/24 0957 05/06/24 1227   05/01/24 1045  micafungin  (MYCAMINE ) 100 mg in sodium chloride  0.9 % 100 mL IVPB        100 mg 105 mL/hr over 1 Hours Intravenous Every 24 hours 05/01/24 0949     04/29/24  1000  micafungin  (MYCAMINE ) 150 mg in sodium chloride  0.9 % 100 mL IVPB  Status:  Discontinued        150 mg 107.5 mL/hr over 1 Hours Intravenous Every 24 hours 04/28/24 1036 05/01/24 0949   04/28/24 1345  micafungin  (MYCAMINE ) 100 mg in sodium chloride  0.9 % 100 mL IVPB        100 mg 105 mL/hr over 1 Hours Intravenous  Once 04/28/24 1245 04/28/24 1354   04/28/24 1000  micafungin  (MYCAMINE ) 100 mg in sodium chloride  0.9 % 100 mL IVPB  Status:  Discontinued        100 mg 105 mL/hr over 1 Hours Intravenous Every 24 hours 04/27/24 1219 04/28/24 1036   04/21/24 1400  clindamycin  (CLEOCIN ) 900 mg, gentamicin  (GARAMYCIN ) 240 mg in sodium chloride  0.9 % 1,000 mL for intraperitoneal lavage  Status:  Discontinued         Irrigation To Surgery 04/21/24 1346 04/21/24 1618   04/20/24 1400  piperacillin -tazobactam (ZOSYN ) IVPB 3.375 g  Status:  Discontinued        3.375 g 12.5 mL/hr over 240 Minutes Intravenous Every 8 hours  04/20/24 0755 05/01/24 0957   04/20/24 1000  metroNIDAZOLE  (FLAGYL ) IVPB 500 mg  Status:  Discontinued        500 mg 100 mL/hr over 60 Minutes Intravenous Every 12 hours 04/20/24 0320 04/20/24 0752   04/20/24 1000  micafungin  (MYCAMINE ) 150 mg in sodium chloride  0.9 % 100 mL IVPB  Status:  Discontinued        150 mg 107.5 mL/hr over 1 Hours Intravenous Every 24 hours 04/20/24 0752 04/27/24 1219   04/20/24 0245  ceFEPIme  (MAXIPIME ) 2 g in sodium chloride  0.9 % 100 mL IVPB  Status:  Discontinued        2 g 200 mL/hr over 30 Minutes Intravenous Every 8 hours 04/20/24 0232 04/20/24 0752   04/20/24 0100  metroNIDAZOLE  (FLAGYL ) IVPB 500 mg        500 mg 100 mL/hr over 60 Minutes Intravenous On call to O.R. 04/20/24 0004 04/20/24 0100   04/14/24 1800  ceFEPIme  (MAXIPIME ) 2 g in sodium chloride  0.9 % 100 mL IVPB        2 g 200 mL/hr over 30 Minutes Intravenous Every 8 hours 04/14/24 1003 04/19/24 0957   04/14/24 1600  vancomycin  (VANCOCIN ) IVPB 1000 mg/200 mL premix  Status:   Discontinued        1,000 mg 200 mL/hr over 60 Minutes Intravenous Every 24 hours 04/13/24 1420 04/14/24 1000   04/14/24 1600  vancomycin  (VANCOREADY) IVPB 1500 mg/300 mL  Status:  Discontinued        1,500 mg 150 mL/hr over 120 Minutes Intravenous Every 24 hours 04/14/24 1002 04/16/24 0744   04/14/24 1200  vancomycin  (VANCOCIN ) IVPB 1000 mg/200 mL premix  Status:  Discontinued        1,000 mg 200 mL/hr over 60 Minutes Intravenous Every 24 hours 04/13/24 1053 04/13/24 1109   04/13/24 2200  ceFEPIme  (MAXIPIME ) 2 g in sodium chloride  0.9 % 100 mL IVPB  Status:  Discontinued        2 g 200 mL/hr over 30 Minutes Intravenous Every 12 hours 04/13/24 1036 04/13/24 1109   04/13/24 2200  ceFEPIme  (MAXIPIME ) 2 g in sodium chloride  0.9 % 100 mL IVPB  Status:  Discontinued        2 g 200 mL/hr over 30 Minutes Intravenous Every 12 hours 04/13/24 1425 04/14/24 1003   04/13/24 1500  vancomycin  (VANCOCIN ) 2,000 mg in sodium chloride  0.9 % 500 mL IVPB        2,000 mg 260 mL/hr over 120 Minutes Intravenous  Once 04/13/24 1409 04/13/24 1850   04/13/24 1500  ceFEPIme  (MAXIPIME ) 2 g in sodium chloride  0.9 % 100 mL IVPB  Status:  Discontinued        2 g 200 mL/hr over 30 Minutes Intravenous Every 12 hours 04/13/24 1420 04/13/24 1425   04/13/24 1500  metroNIDAZOLE  (FLAGYL ) IVPB 500 mg  Status:  Discontinued        500 mg 100 mL/hr over 60 Minutes Intravenous Every 12 hours 04/13/24 1420 04/17/24 0725   04/13/24 1200  vancomycin  (VANCOCIN ) 2,000 mg in sodium chloride  0.9 % 500 mL IVPB  Status:  Discontinued        2,000 mg 260 mL/hr over 120 Minutes Intravenous  Once 04/13/24 1052 04/13/24 1121   04/13/24 1100  metroNIDAZOLE  (FLAGYL ) IVPB 500 mg  Status:  Discontinued        500 mg 100 mL/hr over 60 Minutes Intravenous 2 times daily 04/13/24 1007 04/13/24 1109   04/13/24 1100  vancomycin  (VANCOCIN )  IVPB 1000 mg/200 mL premix  Status:  Discontinued        1,000 mg 200 mL/hr over 60 Minutes Intravenous  Once  04/13/24 1007 04/13/24 1020   04/13/24 1015  ceFEPIme  (MAXIPIME ) 2 g in sodium chloride  0.9 % 100 mL IVPB        2 g 200 mL/hr over 30 Minutes Intravenous STAT 04/13/24 1007 04/14/24 1721   04/10/24 2200  ceFAZolin  (ANCEF ) IVPB 2g/100 mL premix        2 g 200 mL/hr over 30 Minutes Intravenous Every 8 hours 04/10/24 1833 04/11/24 0531   04/10/24 0600  ceFAZolin  (ANCEF ) IVPB 2g/100 mL premix        2 g 200 mL/hr over 30 Minutes Intravenous On call to O.R. 04/10/24 0533 04/10/24 1526         Note: Portions of this report may have been transcribed using voice recognition software. Every effort was made to ensure accuracy; however, inadvertent computerized transcription errors may be present.   Any transcriptional errors that result from this process are unintentional.    Elspeth KYM Schultze, MD, FACS, MASCRS Esophageal, Gastrointestinal & Colorectal Surgery Robotic and Minimally Invasive Surgery  [facility] [address] [place], [place] [address] [phone] Fax [phone] [address]  CONTACT INFORMATION: Weekday (9AM-5PM): Call [facility] main office at [phone] Weeknight (5PM-9AM) or Weekend/Holiday: Check EPIC Web Links tab & use AMION (password  [email or web address]) for General Surgery [facility] coverage  Please, DO NOT use SecureChat  (it is not reliable communication to reach operating surgeons & will lead to a delay in care).   Epic staff messaging available for outptient concerns needing 1-2 business day response.      05/11/2024  7:55 AM

## 2024-05-11 NOTE — Care Management Important Message (Signed)
 Important Message  Patient Details  Name: Kristopher Thompson MRN: [id number] Date of Birth: 07-Jun-1951   Important Message Given:  Yes - Medicare IM     Jon Cruel 05/11/2024, 12:24 PM

## 2024-05-12 ENCOUNTER — Inpatient Hospital Stay (HOSPITAL_COMMUNITY)

## 2024-05-12 DIAGNOSIS — K631 Perforation of intestine (nontraumatic): Secondary | ICD-10-CM | POA: Diagnosis not present

## 2024-05-12 LAB — RENAL FUNCTION PANEL
Albumin: 3 g/dL — ABNORMAL LOW (ref 3.5–5.0)
Anion gap: 13 (ref 5–15)
BUN: 105 mg/dL — ABNORMAL HIGH (ref 8–23)
CO2: 20 mmol/L — ABNORMAL LOW (ref 22–32)
Calcium: 9.8 mg/dL (ref 8.9–10.3)
Chloride: 99 mmol/L (ref 98–111)
Creatinine, Ser: 2.6 mg/dL — ABNORMAL HIGH (ref 0.61–1.24)
GFR, Estimated: 26 mL/min — ABNORMAL LOW (ref >60)
Glucose, Bld: 157 mg/dL — ABNORMAL HIGH (ref 70–99)
Phosphorus: 6.9 mg/dL — ABNORMAL HIGH (ref 2.5–4.6)
Potassium: 4.3 mmol/L (ref 3.5–5.1)
Sodium: 132 mmol/L — ABNORMAL LOW (ref 135–145)

## 2024-05-12 LAB — CBC
HCT: 36.2 % — ABNORMAL LOW (ref 39.0–52.0)
Hemoglobin: 11.4 g/dL — ABNORMAL LOW (ref 13.0–17.0)
MCH: 29.5 pg (ref 26.0–34.0)
MCHC: 31.5 g/dL (ref 30.0–36.0)
MCV: 93.5 fL (ref 80.0–100.0)
Platelets: 147 10*3/uL — ABNORMAL LOW (ref 150–400)
RBC: 3.87 MIL/uL — ABNORMAL LOW (ref 4.22–5.81)
RDW: 19.3 % — ABNORMAL HIGH (ref 11.5–15.5)
WBC: 9.3 10*3/uL (ref 4.0–10.5)
nRBC: 0 % (ref 0.0–0.2)

## 2024-05-12 LAB — GLUCOSE, CAPILLARY
Glucose-Capillary: 194 mg/dL — ABNORMAL HIGH (ref 70–99)
Glucose-Capillary: 130 mg/dL — ABNORMAL HIGH (ref 70–99)
Glucose-Capillary: 141 mg/dL — ABNORMAL HIGH (ref 70–99)
Glucose-Capillary: 144 mg/dL — ABNORMAL HIGH (ref 70–99)
Glucose-Capillary: 101 mg/dL — ABNORMAL HIGH (ref 70–99)
Glucose-Capillary: 170 mg/dL — ABNORMAL HIGH (ref 70–99)

## 2024-05-12 LAB — HEPARIN LEVEL (UNFRACTIONATED): Heparin Unfractionated: 0.4 [IU]/mL (ref 0.30–0.70)

## 2024-05-12 MED ORDER — FENTANYL CITRATE PF 50 MCG/ML IJ SOSY
25.0000 ug | PREFILLED_SYRINGE | INTRAMUSCULAR | Status: DC | PRN
  Administered 2024-05-14: 25 ug via INTRAVENOUS
  Filled 2024-05-12: qty 1

## 2024-05-12 MED ORDER — VITAL 1.5 CAL PO LIQD
1000.0000 mL | ORAL | Status: DC
  Administered 2024-05-12: 1000 mL

## 2024-05-12 MED ORDER — SODIUM CHLORIDE (PF) 0.9% IJ SOLUTION - NO CHARGE
10.0000 mL | Freq: Once | INTRAMUSCULAR | Status: AC
  Administered 2024-05-12: 10 mL via INTRAVENOUS
  Filled 2024-05-12 (×2): qty 10

## 2024-05-12 MED ORDER — IOHEXOL 300 MG/ML  SOLN
100.0000 mL | Freq: Once | INTRAMUSCULAR | Status: AC | PRN
  Administered 2024-05-12: 5 mL

## 2024-05-12 MED ORDER — METOCLOPRAMIDE HCL 5 MG PO TABS
5.0000 mg | ORAL_TABLET | Freq: Three times a day (TID) | ORAL | Status: AC
  Administered 2024-05-12 – 2024-05-17 (×20): 5 mg via ORAL
  Filled 2024-05-12 (×21): qty 1

## 2024-05-12 MED ORDER — IOHEXOL 300 MG/ML  SOLN
100.0000 mL | Freq: Once | INTRAMUSCULAR | Status: AC | PRN
  Administered 2024-05-12: 65 mL

## 2024-05-12 NOTE — Progress Notes (Addendum)
 Patient ID: Kristopher Thompson, male   DOB: 1951-10-17, 73 y.o.   MRN: [id number]  RLQ drain Cr from yesterday suggests persistent urine leak.  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  - Will discuss with Dr. Sheldon but recommend taking RLQ drain off suction.   - Continue nephrostomy drainage. - Will need further diagnostic evaluation and will discuss with IR/Dr. Sheldon.  May be best to consider either a loopogram or possibly a CT of the abdomen/pelvis with contrast per the urostomy.  If the leak is relatively small, this might heal with further diversion (? nephroureteral catheters) but very well could necessitate operative repair.  Addendum: I spoke with Dr. Sheldon and also spoke with Dr. Luverne this morning.  Will proceed with a CT scan of the abdomen and pelvis without IV or oral contrast with contrast in his ileal conduit only to assess for location and size of leak.  Will keep his right lower quadrant drain off suction for now.

## 2024-05-12 NOTE — Progress Notes (Addendum)
 05/12/2024  Kristopher Thompson [id number] January 20, 1951  CARE TEAM: PCP: [name], MD  Outpatient Care Team: Patient Care Team: [name], MD as PCP - General (Internal Medicine) [name], MD (Inactive) as Consulting Physician (Dermatology) [name], MD as Referring Physician (Pulmonary Disease) [name], MD as Consulting Physician (General Surgery) [name], MD as Consulting Physician (Urology)  Inpatient Treatment Team: Treatment Team:  Sira, Zackery, MD [name], MD [name], MD Massie Delaine SAUNDERS, RN Dea Shiner, MD Ruthellen Ruthellen [facility], MD Bobbette Dar, MD Rayburn Pac, MD Luverne Aran, MD Billy Fonda CROME, RN Lendell Latanya BIRCH, [facility] [name], RN [name], MD   Problem List:   Principal Problem:   Delayed bowel perforation s/p SBR/end ileostomy Active Problems:   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 08/08/2017   S/P ileal conduit (HCC)   GERD (gastroesophageal reflux disease)   Obesity (BMI 35.0-39.9 without comorbidity)   Prolonged QT interval   Non-recurrent bilateral inguinal hernia without obstruction or gangrene   Incarcerated incisional hernia   Sinus tachycardia   Tachypnea   Acute respiratory insufficiency, postoperative   Sepsis due to undetermined organism (HCC)   Lactic acidosis   Class 2 obesity   Chronic anticoagulation   Hearing loss   History of bladder cancer   Obstructive sleep apnea of adult   Parastomal hernia of ileal conduit   Partial small bowel obstruction (HCC)   Personal history of PE (pulmonary embolism)   Pressure injury of skin   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   04/10/2024  POST-OPERATIVE DIAGNOSIS:  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   PROCEDURE:   -ROBOTIC LYSIS OF ADHESIONS X 4 HOURS -COMPONENT SEPARATION - TRANSVERSUS ABDOMINIS REALEASE (TAR)  BILATERAL -ROBOTIC REPAIRS OF  INCISIONAL, PARASTOMAL, LEFT INGUINAL  INCARCERATED ABDOMINAL WALL HERNIAS WITH MESH -UROSTOMY ILEAL CONDUIT REVISION -INTRAOPERATIVE ASSESSMENT OF TISSUE VASCULAR PERFUSION USING ICG (indocyanine green ) -IMMUNOFLUORESCENCE -TRANSVERSUS ABDOMINIS PLANE (TAP) BLOCK - BILATERAL    SURGEON:  [name], MD   04/20/2024 POST-OPERATIVE DIAGNOSIS:  perforated bowel   PROCEDURE:  Drainage of right abdominal wall abscess as well as intra-abdominal abscess Explantation of abdominal mesh Small bowel resection (in discontinuity) Placement of ABThera wound VAC   SURGEON:  [name], MD  04/21/2024  POST-OPERATIVE DIAGNOSIS:  DELAYED BOWEL PERFORATION WITH OPEN ABDOMEN   PROCEDURE:   LYSIS OF ADHESIONS X ABDOMINAL WALL DEBRIDEMENT ILEAL RESECTION END ILEOSTOMY ABDOMINAL WALL PARASTOMAL & INCISIONAL HERNIA REPAIRS WITH PHASIX MESH FASCIA CLOSURE WITH WOUND VAC PLACEMENT IN SQ   SURGEON:  [name], MD    05/04/2024 Interventional Radiology Procedure Note   Procedure: Bilateral percutaneous nephrostomy tube placements   Findings: Please refer to procedural dictation for full description. 10 Fr bilateral to bag drainage.   Complications: None immediate   Estimated Blood Loss: < 5 ml   Recommendations: Keep to bag drainage.  IR will follow.  Ultimate management as per Urology.   Assessment  -Incarcerated parastomal incisional hernias with obstructive symptoms status post robotic takedown and repair -Delayed small bowel perforation s/p ileal resection and end ileostomy -Delayed urine leak of urostomy ileal conduit status post perc nephrostomy tubes -Multisystem organ failure gradually resolving  SLOWLY IMPROVING   Plan:  Delayed urine leak with AKI:  Urinary diversion with percutaneous nephrostomy tubes.  Adjusted 6/30  Still with urine draining into abdominal wall drain.  A little more coming out the urostomy.  Does not  seem well-controlled.  Discussed with Dr. Renda again this morning.  He wishes to avoid bulb suction and switch just to biliary gravity bags on the surgical drains.  Order placed.  He is going to reach out to interventional neurology again to try and do CT of abdomen pelvis with loopogram through the nephrostomy tubes.  See if can locate the leak and see if he can be stented to crossed or better controlled.  If not controlled with urinary diversion and stenting, he may require revision of his ileal conduit.  That would be very high risk.  Understand they wish to hold off but do not want to have nephrostomy tubes indefinitely either if possible.  Defer to Dr. Renda urology with interventional radiology   confusing picture with urine output out 3 places: urostomy, ileal conduit, abdominal wall right lower quadrant surgical drain, and right nephrostomy tube.  Most out the surgical drain.  I am concerned that urine is not probably diverted.  Still uremic but nonoliguric.  Nephrology signed off 6/26.  Left message and asked them reevaluate since he is having some persistent hypophosphatemia and his BUN is above 100. Patient mentally stable right now so probably does not need emergent dialysis, but may need to be considered.       Nutrition:  Regular diet as tolerated.  Speech therapy concurs  On nightly tube feeds 80 mL/hr x 8hr = ~60% needs  Patient remains nauseated despite holding loperamide  with bowel regimen.  Will add scheduled metoclopramide /Reglan  and see if that helps.  If eats better can consider pulling feeding tube since his albumin  is above 3 and prealbumin is much improved.  Do not want him to lose ground if he does not eat though   Bowel regimen for ileostomy.  soluble polyfiber/FiberCon to thicken with nightly loperamide  seems to be working   [employer] 6/16   Hold off on G-tube unless poor PO effort >14days  Infection due to delayed bowel perforation and abdominal wall  infection/necrosis  Growing multiple organisms including Candida albicans, Enterobacter, and Enterococcus.    IV Zosyn /micafungin .  Could consider switching to Bactrim /fluconazole  but concern for QT interval on the fluconazole .  Continue IV for now  Anticipate prolonged antibiotics given the abdominal wall infection/necrosis and need for Phasix mesh.   Change wound vac q MonThu in the hopes for the subcutaneous tissues to recover.  Last change 6/25 showing early granulation without necrosis guardedly reassuring.      Metabolic encephalopathy resolved.  Follow.  Insomnia a challenge.  Hopefully if we can get the Corpak out, that will help.  Restarted melatonin 6/30.  See what internal medicine thinks.  Patient wife are against doing Ambien .  ABLA on top of anemia of chronic disease improved with transfusion.  Follow.    -monitor electrolytes & replace as needed.  Keep K>4, Mg>2, Phos>3.  Persistent hypophosphatemia.  Pharmacy order direct for supplemental phosphorus.  Reached out to nephrology to see if further adjustments need to made to his chronic phosphorus meds or not  - He has had no cardiac events.  I do not think that he needs to continue telemetry.  Was going to discontinue if medicine agrees  -Diabetes.  Sliding scale insulin .    -VTE prophylaxis-  Full anticoagulation heparin  drip given history of pulmonary embolism,.  Could consider back on DOAC enteral route once procedures done.  Perhaps day after nephrostograms done   -Mobilize as tolerated.  Starting to work with physical and Occupational Therapy more.  Physical therapy in room.  Hopefully can rebuild strength and minimize further atrophy/decline.   I updated the patient's status to the the patient & ICU RN in room.  Discussed with Dr. Annella with critical care.  Recommendations were made.  Questions were answered.  They expressed understanding & appreciation.  -Disposition: Possible transition to inpatient rehab if  makes goals.  I would hold off on LTAC.     I reviewed nursing notes, last 24 h vitals and pain scores, last 48 h intake and output, last 24 h labs and trends, and last 24 h imaging results.  I have reviewed this patient's available data, including medical history, events of note, test results, etc as part of my evaluation.   A significant portion of that time was spent in counseling. Care during the described time interval was provided by me.  This care required high  level of medical decision making.  05/12/2024    Subjective: (Chief complaint)  Nephrostomy tubes adjusted and chronic left ureteral/ileal conduit stricture stented.  More drainage into urostomy but still significant drainage into abdominal wall surgical drain.  Patient Nuys much pain.  Wife at bedside.  Patient feeling nauseated with eating.  Nausea med help x 1.  Objective:  Vital signs:  Vitals:   05/11/24 1140 05/11/24 1534 05/11/24 2229 05/12/24 0318  BP: 102/83 114/82 120/81 105/74  Pulse: (!) 108 (!) 108 (!) 104 (!) 105  Resp: 18  14 19   Temp: 98.8 F (37.1 C) 98.6 F (37 C) 98.2 F (36.8 C) 98.1 F (36.7 C)  TempSrc: Axillary Axillary Axillary Oral  SpO2: 98% 97% 97% 94%  Weight:      Height:        Last BM Date : 05/11/24  Intake/Output   Yesterday:  06/30 0701 - 07/01 0700 In: 5031.9 [I.V.:630.9; WH/HU:6172.6; IV Piggyback:573.6] Out: 3365 [Urine:1820; Drains:1030; Stool:515] This shift:  No intake/output data recorded.  Bowel function:  Flatus: YES  BM:  YES -small  Drains:  RLQ drain (rests between abdominal wall and Phasix mesh): Serous moderate volume.  LLQ drain (runs over bowel with tip down in RLQ pelvis near base of ileal conduit and site of bowel resection): Serous   Nephrostomy tubes in place.  - light yellow-colored  Physical Exam:  General: Resting in no acute distress.  Legs crossed looking relaxed.  Mentally sharp and back to his baseline Eyes: PERRL, normal  EOM.  Sclera clear.  No icterus Neuro: CN II-XII intact w/o focal sensory/motor deficits. Lymph: No head/neck/groin lymphadenopathy Psych:  No delerium/psychosis/paranoia.  No agitation HENT: Normocephalic, Mucus membranes moist.  No thrush.  ETT & NGT in place Neck: Supple, No tracheal deviation.  No obvious thyromegaly Chest: No pain to chest wall compression.  Good respiratory excursion.   CV:  Pulses intact.  Regular rhythm.  1-2+ BUE/BLE edema MS:  No obvious deformity  Abdomen:  Obese Soft.  Nondistended.  Nontender Wound vac (in SQ over closed fascia) in midline.    RUQ: (End ileostomy): Pink mucosa.  Brown oatmeal thick effluent in bag RLQ:  (Urostomy ileal conduit): Pink mucosa without any obvious separation.  Moderate clear light yellow-colored urine  Ext:   No deformity.  Bilateral hand 0-1+ edema proved.  No lower extremity edema.  No cyanosis Skin: No petechiae / purpurea.  No major sores.  Warm and dry    Results:   Cultures: Recent Results (from the past 720 hours)  Culture, blood (Routine X 2) w Reflex to ID  Panel     Status: None   Collection Time: 04/13/24  7:13 AM   Specimen: BLOOD  Result Value Ref Range Status   Specimen Description   Final    BLOOD BLOOD LEFT ARM AEROBIC BOTTLE ONLY ANAEROBIC BOTTLE ONLY Performed at [facility], [address], [place], [place] [address]    Special Requests   Final    BOTTLES DRAWN AEROBIC AND ANAEROBIC Blood Culture results may not be optimal due to an inadequate volume of blood received in culture bottles Performed at [facility], [address] [place], [place] [address]    Culture   Final    NO GROWTH 5 DAYS Performed at [facility], [address], [place], [place] [address]    Report Status 04/18/2024 FINAL  Final  Culture, blood (Routine X 2) w Reflex to ID Panel     Status: None   Collection Time: 04/13/24  7:20 AM   Specimen: BLOOD  Result Value Ref Range  Status   Specimen Description   Final    BLOOD BLOOD RIGHT ARM AEROBIC BOTTLE ONLY ANAEROBIC BOTTLE ONLY Performed at [facility] [address] [place], [place] [address]    Special Requests   Final    BOTTLES DRAWN AEROBIC AND ANAEROBIC Blood Culture results may not be optimal due to an inadequate volume of blood received in culture bottles Performed at [facility], [address], [place], [place] [address]    Culture   Final    NO GROWTH 5 DAYS Performed at [facility], [address] [place], [place] [address]    Report Status 04/18/2024 FINAL  Final  Urine Culture     Status: None   Collection Time: 04/13/24 10:18 AM   Specimen: Urine, Clean Catch  Result Value Ref Range Status   Specimen Description   Final    URINE, CLEAN CATCH Performed at [facility], [address] [place], [place] [address]    Special Requests   Final    NONE Performed at [facility], [address], [place], [place] [address]    Culture   Final    NO GROWTH Performed at [facility], [address] [place], [place] [address]    Report Status 04/14/2024 FINAL  Final  MRSA Next Gen by PCR, Nasal     Status: None   Collection Time: 04/13/24 11:35 AM   Specimen: Nasal Mucosa; Nasal Swab  Result Value Ref Range Status   MRSA by PCR Next Gen NOT DETECTED NOT DETECTED Final    Comment: (NOTE) The GeneXpert MRSA Assay (FDA approved for NASAL specimens only), is one component of a comprehensive MRSA colonization surveillance program. It is not intended to diagnose MRSA infection nor to guide or monitor treatment for MRSA infections. Test performance is not FDA approved in patients less than 37 years old. Performed at [facility], [address] [place], [place] [address]   Culture, blood (Routine X 2) w Reflex to ID Panel     Status: None   Collection Time: 04/20/24  8:46 AM    Specimen: BLOOD RIGHT ARM  Result Value Ref Range Status   Specimen Description   Final    BLOOD RIGHT ARM Performed at [facility], [address] [place], [place] [address]    Special Requests   Final    BOTTLES DRAWN AEROBIC AND ANAEROBIC  Blood Culture results may not be optimal due to an inadequate volume of blood received in culture bottles Performed at [facility], [address], [place], [place] [address]    Culture   Final    NO GROWTH 5 DAYS Performed at [facility], [address], [place], [place] [address]    Report Status 04/25/2024 FINAL  Final  Culture, blood (Routine X 2) w Reflex to ID Panel     Status: None   Collection Time: 04/20/24  8:49 AM   Specimen: BLOOD  Result Value Ref Range Status   Specimen Description   Final    BLOOD Performed at [facility], [address] [place], [place] [address]    Special Requests   Final    BOTTLES DRAWN AEROBIC AND ANAEROBIC Blood Culture results may not be optimal due to an inadequate volume of blood received in culture bottles Performed at [facility], [address] [place], [place] [address]    Culture   Final    NO GROWTH 5 DAYS Performed at [facility], [address], [place], [place] [address]    Report Status 04/25/2024 FINAL  Final  Culture, Respiratory w Gram Stain     Status: None   Collection Time: 04/20/24 12:03 PM   Specimen: Tracheal Aspirate; Respiratory  Result Value Ref Range Status   Specimen Description   Final    TRACHEAL ASPIRATE Performed at [facility], [address], [place], [place] [address]    Special Requests   Final    NONE Performed at [facility], [address], [place], [place] [address]    Gram Stain NO WBC SEEN RARE GRAM POSITIVE COCCI   Final   Culture   Final    RARE Normal respiratory flora-no Staph aureus or Pseudomonas seen Performed at [facility], [address] [place], [place] [address]    Report Status 04/23/2024 FINAL  Final  Aerobic/Anaerobic Culture w Gram Stain (surgical/deep wound)     Status: None   Collection Time: 04/21/24  2:23 PM   Specimen: Soft Tissue, Other  Result Value Ref Range Status   Specimen Description   Final    TISSUE INTRA ABDOMINAL NECROTIC TISSUE Performed at [facility], [address] [place], [place] [address]    Special Requests   Final    NONE Performed at [facility], [address], [place], [place] [address]    Gram Stain   Final    FEW WBC PRESENT,BOTH PMN AND MONONUCLEAR RARE GRAM POSITIVE COCCI IN PAIRS AND CHAINS RARE YEAST    Culture   Final    RARE ESCHERICHIA COLI FEW CANDIDA ALBICANS FEW ENTEROCOCCUS FAECALIS FEW BACTEROIDES SPECIES BETA LACTAMASE POSITIVE Performed at [facility], [address] [place], [place] [address]    Report Status 04/25/2024 FINAL  Final   Organism ID, Bacteria ESCHERICHIA COLI  Final   Organism ID, Bacteria ENTEROCOCCUS FAECALIS  Final      Susceptibility   Escherichia coli - MIC*    AMPICILLIN >=32 RESISTANT Resistant     CEFEPIME  <=0.12 SENSITIVE Sensitive     CEFTAZIDIME <=1 SENSITIVE Sensitive     CEFTRIAXONE  1 SENSITIVE Sensitive     CIPROFLOXACIN  <=0.25 SENSITIVE Sensitive     GENTAMICIN  <=1 SENSITIVE Sensitive     IMIPENEM <=0.25 SENSITIVE Sensitive     TRIMETH /SULFA  <=20 SENSITIVE Sensitive  AMPICILLIN/SULBACTAM >=32 RESISTANT Resistant     PIP/TAZO 8 SENSITIVE Sensitive ug/mL    * RARE ESCHERICHIA COLI   Enterococcus faecalis - MIC*    AMPICILLIN <=2 SENSITIVE Sensitive     VANCOMYCIN  1 SENSITIVE Sensitive     GENTAMICIN  SYNERGY SENSITIVE Sensitive     * FEW ENTEROCOCCUS FAECALIS    Labs: Results for orders placed or performed during the hospital encounter of 04/10/24 (from the past 48 hours)  Glucose, capillary     Status: None   Collection Time: 05/10/24  7:39 AM  Result  Value Ref Range   Glucose-Capillary 87 70 - 99 mg/dL    Comment: Glucose reference range applies only to samples taken after fasting for at least 8 hours.  Glucose, capillary     Status: Abnormal   Collection Time: 05/10/24 12:00 PM  Result Value Ref Range   Glucose-Capillary 125 (H) 70 - 99 mg/dL    Comment: Glucose reference range applies only to samples taken after fasting for at least 8 hours.  Glucose, capillary     Status: Abnormal   Collection Time: 05/10/24  3:36 PM  Result Value Ref Range   Glucose-Capillary 144 (H) 70 - 99 mg/dL    Comment: Glucose reference range applies only to samples taken after fasting for at least 8 hours.  Glucose, capillary     Status: Abnormal   Collection Time: 05/10/24  7:56 PM  Result Value Ref Range   Glucose-Capillary 145 (H) 70 - 99 mg/dL    Comment: Glucose reference range applies only to samples taken after fasting for at least 8 hours.  Glucose, capillary     Status: Abnormal   Collection Time: 05/10/24 11:26 PM  Result Value Ref Range   Glucose-Capillary 162 (H) 70 - 99 mg/dL    Comment: Glucose reference range applies only to samples taken after fasting for at least 8 hours.  Prealbumin     Status: Abnormal   Collection Time: 05/11/24 12:00 AM  Result Value Ref Range   Prealbumin 40 (H) 18 - 38 mg/dL    Comment: Performed at [facility], [address], [place], [place] [address]  Comprehensive metabolic panel with GFR     Status: Abnormal   Collection Time: 05/11/24 12:00 AM  Result Value Ref Range   Sodium 133 (L) 135 - 145 mmol/L   Potassium 4.4 3.5 - 5.1 mmol/L   Chloride 100 98 - 111 mmol/L   CO2 20 (L) 22 - 32 mmol/L   Glucose, Bld 147 (H) 70 - 99 mg/dL    Comment: Glucose reference range applies only to samples taken after fasting for at least 8 hours.   BUN 96 (H) 8 - 23 mg/dL   Creatinine, Ser 7.47 (H) 0.61 - 1.24 mg/dL   Calcium  9.9 8.9 - 10.3 mg/dL   Total Protein 7.0 6.5 - 8.1 g/dL   Albumin  3.0 (L) 3.5 - 5.0  g/dL   AST 34 15 - 41 U/L   ALT 58 (H) 0 - 44 U/L   Alkaline Phosphatase 93 38 - 126 U/L   Total Bilirubin 0.7 0.0 - 1.2 mg/dL   GFR, Estimated 27 (L) >60 mL/min    Comment: (NOTE) Calculated using the CKD-EPI Creatinine Equation (2021)    Anion gap 13 5 - 15    Comment: Performed at [facility], [address] [place], [place] [address]  Glucose, capillary     Status: Abnormal   Collection Time: 05/11/24  3:01 AM  Result Value Ref Range   Glucose-Capillary 191 (H) 70 - 99 mg/dL    Comment: Glucose reference range applies only to samples taken after fasting for at least 8 hours.  Heparin  level (unfractionated)     Status: Abnormal   Collection Time: 05/11/24  5:00 AM  Result Value Ref Range   Heparin  Unfractionated 0.27 (L) 0.30 - 0.70 IU/mL    Comment: (NOTE) The clinical reportable range upper limit is being lowered to >1.10 to align with the FDA approved guidance for the current laboratory assay.  If heparin  results are below expected values, and patient dosage has  been confirmed, suggest follow up testing of antithrombin III levels. Performed at [facility], [address], [place], [place] [address]   Renal function panel     Status: Abnormal   Collection Time: 05/11/24  5:00 AM  Result Value Ref Range   Sodium 132 (L) 135 - 145 mmol/L   Potassium 4.9 3.5 - 5.1 mmol/L   Chloride 99 98 - 111 mmol/L   CO2 19 (L) 22 - 32 mmol/L   Glucose, Bld 183 (H) 70 - 99 mg/dL    Comment: Glucose reference range applies only to samples taken after fasting for at least 8 hours.   BUN 95 (H) 8 - 23 mg/dL   Creatinine, Ser 7.38 (H) 0.61 - 1.24 mg/dL   Calcium  9.8 8.9 - 10.3 mg/dL   Phosphorus 1.2 (L) 2.5 - 4.6 mg/dL   Albumin  3.0 (L) 3.5 - 5.0 g/dL   GFR, Estimated 25 (L) >60 mL/min    Comment: (NOTE) Calculated using the CKD-EPI Creatinine Equation (2021)    Anion gap 14 5 - 15    Comment: Performed at [facility], [address] [place], [place] [address]   CBC     Status: Abnormal   Collection Time: 05/11/24  5:00 AM  Result Value Ref Range   WBC 9.4 4.0 - 10.5 K/uL   RBC 3.70 (L) 4.22 - 5.81 MIL/uL   Hemoglobin 10.9 (L) 13.0 - 17.0 g/dL   HCT 65.7 (L) 60.9 - 47.9 %   MCV 92.4 80.0 - 100.0 fL   MCH 29.5 26.0 - 34.0 pg   MCHC 31.9 30.0 - 36.0 g/dL   RDW 81.1 (H) 88.4 - 84.4 %   Platelets 145 (L) 150 - 400 K/uL   nRBC 0.0 0.0 - 0.2 %    Comment: Performed at [facility], [address] [place], [place] [address]  Glucose, capillary     Status: Abnormal   Collection Time: 05/11/24  7:53 AM  Result Value Ref Range   Glucose-Capillary 217 (H) 70 - 99 mg/dL    Comment: Glucose reference range applies only to samples taken after fasting for at least 8 hours.  Glucose, capillary     Status: Abnormal   Collection Time: 05/11/24 11:38 AM  Result Value Ref Range   Glucose-Capillary 129 (H) 70 - 99 mg/dL    Comment: Glucose reference range applies only to samples taken after fasting for at least 8 hours.  Glucose, capillary     Status: Abnormal   Collection Time: 05/11/24  3:53 PM  Result Value Ref Range   Glucose-Capillary 121 (H) 70 - 99 mg/dL    Comment: Glucose reference range applies only to samples taken after fasting for at least 8 hours.  Creatinine, fluid (pleural, peritoneal, JP Drainage)     Status: None   Collection Time:  05/11/24  4:30 PM  Result Value Ref Range   Creat, Fluid 17.8 mg/dL    Comment: (NOTE) No normal range established for this test Results should be evaluated in conjunction with serum values    Fluid Type-FCRE JP DRAINAGE     Comment: Performed at [facility], [address], [place], [place] [address]  Glucose, capillary     Status: Abnormal   Collection Time: 05/11/24  8:07 PM  Result Value Ref Range   Glucose-Capillary 155 (H) 70 - 99 mg/dL    Comment: Glucose reference range applies only to samples taken after fasting for at least 8 hours.  Glucose, capillary     Status: Abnormal    Collection Time: 05/11/24 11:01 PM  Result Value Ref Range   Glucose-Capillary 115 (H) 70 - 99 mg/dL    Comment: Glucose reference range applies only to samples taken after fasting for at least 8 hours.  Glucose, capillary     Status: Abnormal   Collection Time: 05/12/24  3:19 AM  Result Value Ref Range   Glucose-Capillary 194 (H) 70 - 99 mg/dL    Comment: Glucose reference range applies only to samples taken after fasting for at least 8 hours.  Heparin  level (unfractionated)     Status: None   Collection Time: 05/12/24  4:12 AM  Result Value Ref Range   Heparin  Unfractionated 0.40 0.30 - 0.70 IU/mL    Comment: (NOTE) The clinical reportable range upper limit is being lowered to >1.10 to align with the FDA approved guidance for the current laboratory assay.  If heparin  results are below expected values, and patient dosage has  been confirmed, suggest follow up testing of antithrombin III levels. Performed at [facility], [address] [place], [place] [address]   Renal function panel     Status: Abnormal   Collection Time: 05/12/24  4:12 AM  Result Value Ref Range   Sodium 132 (L) 135 - 145 mmol/L   Potassium 4.3 3.5 - 5.1 mmol/L   Chloride 99 98 - 111 mmol/L   CO2 20 (L) 22 - 32 mmol/L   Glucose, Bld 157 (H) 70 - 99 mg/dL    Comment: Glucose reference range applies only to samples taken after fasting for at least 8 hours.   BUN 105 (H) 8 - 23 mg/dL   Creatinine, Ser 7.39 (H) 0.61 - 1.24 mg/dL    Comment: DELTA CHECK NOTED   Calcium  9.8 8.9 - 10.3 mg/dL   Phosphorus 6.9 (H) 2.5 - 4.6 mg/dL   Albumin  3.0 (L) 3.5 - 5.0 g/dL   GFR, Estimated 26 (L) >60 mL/min    Comment: (NOTE) Calculated using the CKD-EPI Creatinine Equation (2021)    Anion gap 13 5 - 15    Comment: Performed at [facility], [address] [place], [place] [address]  CBC     Status: Abnormal   Collection Time: 05/12/24  4:12 AM  Result Value Ref Range   WBC 9.3 4.0 - 10.5 K/uL   RBC 3.87  (L) 4.22 - 5.81 MIL/uL   Hemoglobin 11.4 (L) 13.0 - 17.0 g/dL   HCT 63.7 (L) 60.9 - 47.9 %   MCV 93.5 80.0 - 100.0 fL   MCH 29.5 26.0 - 34.0 pg   MCHC 31.5 30.0 - 36.0 g/dL   RDW 80.6 (H) 88.4 - 84.4 %   Platelets 147 (L) 150 - 400 K/uL   nRBC 0.0 0.0 - 0.2 %  Comment: Performed at [facility], [address], [place], [place] [address]    Imaging / Studies: IR NEPHROSTOMY EXCHANGE BILATERAL Result Date: 05/11/2024 INDICATION: Status post prior cystectomy with ileal conduit formation. Recent bowel surgery for bowel perforation. Concern for possible urine leak. EXAM: 1. LEFT PERCUTANEOUS NEPHROSTOMY TUBE EXCHANGE UNDER FLUOROSCOPY INCLUDING FULL ANTEGRADE NEPHROSTOGRAM 2. RIGHT PERCUTANEOUS NEPHROSTOMY TUBE EXCHANGE UNDER FLUOROSCOPY INCLUDING FULL ANTEGRADE NEPHROSTOGRAM COMPARISON:  None Available. MEDICATIONS: None ANESTHESIA/SEDATION: None CONTRAST:  50 mL Omnipaque  300-administered into the collecting system(s) FLUOROSCOPY: Radiation Exposure Index (as provided by the fluoroscopic device): 111 mGy Kerma COMPLICATIONS: None immediate. PROCEDURE: Informed written consent was obtained from the patient after a thorough discussion of the procedural risks, benefits and alternatives. All questions were addressed. Maximal Sterile Barrier Technique was utilized including caps, mask, sterile gowns, sterile gloves, sterile drape, hand hygiene and skin antiseptic. A timeout was performed prior to the initiation of the procedure. Both percutaneous nephrostomy tubes were injected with contrast material under fluoroscopy and multiple fluoroscopy loops and images were saved. The left percutaneous nephrostomy tube was then removed over a guidewire and exchanged for a 5 French catheter over a wire. The catheter was further advanced into the left ureter and additional nephrostogram performed to assess ureteral patency. A new 10 French percutaneous nephrostomy tube was then formed over a guidewire in the left  renal pelvis A new 10 French right percutaneous nephrostomy tube was then exchanged over a guidewire and formed in the renal pelvis. Both nephrostomy tubes were secured at the skin with Prolene retention sutures, adhesive StatLock devices and attached to new gravity drainage bags. FINDINGS: Injection of the left percutaneous nephrostomy tube demonstrates retraction of the tube which is peripherally located in a lower pole calyx. Contrast injection demonstrates left-sided hydronephrosis and poor drainage into the ileal conduit with suggestion of a distal ureteral stricture. This was confirmed by advancement of a 5 French catheter into the distal ureter with contrast injection demonstrating a high-grade stricture but flow of contrast through the stricture and into the ileal conduit once a catheter was advanced near the level of the stricture. There is no evidence of left-sided urine leak. The left nephrostomy tube was replaced with a new 10 French catheter formed at the level of the renal pelvis. A right nephrostogram demonstrates retraction the right nephrostomy tube into a lower pole calyx. Contrast injection demonstrates good flow of contrast into the right ureter and ileal conduit without evidence of ureteral stricture or urine leak. The right nephrostomy tube was replaced with a new 10 French catheter formed at the level of the renal pelvis. IMPRESSION: 1. 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 Injection of contrast at the level of the distal ureter does show flow through the stricture and into the ileal conduit. No left-sided urine leak demonstrated. 2. Right nephrostogram demonstrates normally patent ureter and ureteral anastomosis with the ileal conduit. No right-sided urine leak demonstrated. 3. Both nephrostomy  tubes had retracted into lower pole calices and were replaced with new 10 French catheters formed at the level of the renal pelvis bilaterally. Electronically Signed   By: Marcey Moan M.D.   On: 05/11/2024 12:35        Medications / Allergies: per chart  Antibiotics: Anti-infectives (From admission, onward)    Start     Dose/Rate Route  Frequency Ordered Stop   05/06/24 1400  piperacillin -tazobactam (ZOSYN ) IVPB 3.375 g        3.375 g 12.5 mL/hr over 240 Minutes Intravenous Every 8 hours 05/06/24 1227     05/01/24 1400  piperacillin -tazobactam (ZOSYN ) IVPB 2.25 g  Status:  Discontinued        2.25 g 100 mL/hr over 30 Minutes Intravenous Every 8 hours 05/01/24 0957 05/06/24 1227   05/01/24 1045  micafungin  (MYCAMINE ) 100 mg in sodium chloride  0.9 % 100 mL IVPB        100 mg 105 mL/hr over 1 Hours Intravenous Every 24 hours 05/01/24 0949     04/29/24 1000  micafungin  (MYCAMINE ) 150 mg in sodium chloride  0.9 % 100 mL IVPB  Status:  Discontinued        150 mg 107.5 mL/hr over 1 Hours Intravenous Every 24 hours 04/28/24 1036 05/01/24 0949   04/28/24 1345  micafungin  (MYCAMINE ) 100 mg in sodium chloride  0.9 % 100 mL IVPB        100 mg 105 mL/hr over 1 Hours Intravenous  Once 04/28/24 1245 04/28/24 1354   04/28/24 1000  micafungin  (MYCAMINE ) 100 mg in sodium chloride  0.9 % 100 mL IVPB  Status:  Discontinued        100 mg 105 mL/hr over 1 Hours Intravenous Every 24 hours 04/27/24 1219 04/28/24 1036   04/21/24 1400  clindamycin  (CLEOCIN ) 900 mg, gentamicin  (GARAMYCIN ) 240 mg in sodium chloride  0.9 % 1,000 mL for intraperitoneal lavage  Status:  Discontinued         Irrigation To Surgery 04/21/24 1346 04/21/24 1618   04/20/24 1400  piperacillin -tazobactam (ZOSYN ) IVPB 3.375 g  Status:  Discontinued        3.375 g 12.5 mL/hr over 240 Minutes Intravenous Every 8 hours 04/20/24 0755 05/01/24 0957   04/20/24 1000  metroNIDAZOLE  (FLAGYL ) IVPB 500 mg  Status:  Discontinued        500 mg 100  mL/hr over 60 Minutes Intravenous Every 12 hours 04/20/24 0320 04/20/24 0752   04/20/24 1000  micafungin  (MYCAMINE ) 150 mg in sodium chloride  0.9 % 100 mL IVPB  Status:  Discontinued        150 mg 107.5 mL/hr over 1 Hours Intravenous Every 24 hours 04/20/24 0752 04/27/24 1219   04/20/24 0245  ceFEPIme  (MAXIPIME ) 2 g in sodium chloride  0.9 % 100 mL IVPB  Status:  Discontinued        2 g 200 mL/hr over 30 Minutes Intravenous Every 8 hours 04/20/24 0232 04/20/24 0752   04/20/24 0100  metroNIDAZOLE  (FLAGYL ) IVPB 500 mg        500 mg 100 mL/hr over 60 Minutes Intravenous On call to O.R. 04/20/24 0004 04/20/24 0100   04/14/24 1800  ceFEPIme  (MAXIPIME ) 2 g in sodium chloride  0.9 % 100 mL IVPB        2 g 200 mL/hr over 30 Minutes Intravenous Every 8 hours 04/14/24 1003 04/19/24 0957   04/14/24 1600  vancomycin  (VANCOCIN ) IVPB 1000 mg/200 mL premix  Status:  Discontinued        1,000 mg 200 mL/hr over 60 Minutes Intravenous Every 24 hours 04/13/24 1420 04/14/24 1000   04/14/24 1600  vancomycin  (VANCOREADY) IVPB 1500 mg/300 mL  Status:  Discontinued        1,500 mg 150 mL/hr over 120 Minutes Intravenous Every 24 hours 04/14/24 1002 04/16/24 0744   04/14/24 1200  vancomycin  (VANCOCIN ) IVPB 1000 mg/200 mL premix  Status:  Discontinued  1,000 mg 200 mL/hr over 60 Minutes Intravenous Every 24 hours 04/13/24 1053 04/13/24 1109   04/13/24 2200  ceFEPIme  (MAXIPIME ) 2 g in sodium chloride  0.9 % 100 mL IVPB  Status:  Discontinued        2 g 200 mL/hr over 30 Minutes Intravenous Every 12 hours 04/13/24 1036 04/13/24 1109   04/13/24 2200  ceFEPIme  (MAXIPIME ) 2 g in sodium chloride  0.9 % 100 mL IVPB  Status:  Discontinued        2 g 200 mL/hr over 30 Minutes Intravenous Every 12 hours 04/13/24 1425 04/14/24 1003   04/13/24 1500  vancomycin  (VANCOCIN ) 2,000 mg in sodium chloride  0.9 % 500 mL IVPB        2,000 mg 260 mL/hr over 120 Minutes Intravenous  Once 04/13/24 1409 04/13/24 1850   04/13/24 1500   ceFEPIme  (MAXIPIME ) 2 g in sodium chloride  0.9 % 100 mL IVPB  Status:  Discontinued        2 g 200 mL/hr over 30 Minutes Intravenous Every 12 hours 04/13/24 1420 04/13/24 1425   04/13/24 1500  metroNIDAZOLE  (FLAGYL ) IVPB 500 mg  Status:  Discontinued        500 mg 100 mL/hr over 60 Minutes Intravenous Every 12 hours 04/13/24 1420 04/17/24 0725   04/13/24 1200  vancomycin  (VANCOCIN ) 2,000 mg in sodium chloride  0.9 % 500 mL IVPB  Status:  Discontinued        2,000 mg 260 mL/hr over 120 Minutes Intravenous  Once 04/13/24 1052 04/13/24 1121   04/13/24 1100  metroNIDAZOLE  (FLAGYL ) IVPB 500 mg  Status:  Discontinued        500 mg 100 mL/hr over 60 Minutes Intravenous 2 times daily 04/13/24 1007 04/13/24 1109   04/13/24 1100  vancomycin  (VANCOCIN ) IVPB 1000 mg/200 mL premix  Status:  Discontinued        1,000 mg 200 mL/hr over 60 Minutes Intravenous  Once 04/13/24 1007 04/13/24 1020   04/13/24 1015  ceFEPIme  (MAXIPIME ) 2 g in sodium chloride  0.9 % 100 mL IVPB        2 g 200 mL/hr over 30 Minutes Intravenous STAT 04/13/24 1007 04/14/24 1721   04/10/24 2200  ceFAZolin  (ANCEF ) IVPB 2g/100 mL premix        2 g 200 mL/hr over 30 Minutes Intravenous Every 8 hours 04/10/24 1833 04/11/24 0531   04/10/24 0600  ceFAZolin  (ANCEF ) IVPB 2g/100 mL premix        2 g 200 mL/hr over 30 Minutes Intravenous On call to O.R. 04/10/24 0533 04/10/24 1526         Note: Portions of this report may have been transcribed using voice recognition software. Every effort was made to ensure accuracy; however, inadvertent computerized transcription errors may be present.   Any transcriptional errors that result from this process are unintentional.    Kristopher KYM Schultze, MD, FACS, MASCRS Esophageal, Gastrointestinal & Colorectal Surgery Robotic and Minimally Invasive Surgery  [facility] [address] [place], [place] [address] [phone] Fax [phone] Main  CONTACT INFORMATION: Weekday (9AM-5PM): Call [facility] main office at [phone] Weeknight (5PM-9AM) or Weekend/Holiday: Check EPIC Web Links tab & use AMION (password  [email or web address]) for General Surgery [facility] coverage  Please, DO NOT use SecureChat  (it is not reliable communication to reach operating surgeons & will lead to a delay in care).   Epic staff messaging available for outptient concerns needing 1-2 business day response.  05/12/2024  7:09 AM

## 2024-05-12 NOTE — Progress Notes (Signed)
 Progress Note   Patient: Kristopher Thompson FMW:[id number] DOB: [birth year] DOA: 04/10/2024     32 DOS: the patient was seen and examined on 05/12/2024   Brief hospital course: Brief Narrative:   73 year old with history of prostate cancer, multiple abdominal surgeries, DM2, chronic anemia, PE on Eliquis at home, CHF with preserved EF, obesity initially presented to the hospital on 5/30 for robotic/laparoscopic surgery for incarcerated abdominal wall hernias requiring extensive lysis, mesh insertion, repair, urostomy ileal conduit revision thereafter admitted to the ICU due to tachycardia and tachypnea.  Initially placed on norepinephrine  and an arterial line was placed.  Patient declined NG tube placement but eventually agreed.  Patient was taken back to the OR on 6/9 due to perforated bowel and postop shock.  Required multiple pressors, steroids and PRBC transfusion after washout/closure in the OR.  On 6/11 developed AKI requiring dialysis..  Patient was extubated on 6/14.  04/10/2024-laparoscopic surgery 04/13/24 CCM consult. Incr NE. Art line  6/3 weaning NE. Gently diuresed w 3L off  6/4 off NE. Pulled out his NGT refused replacement. After multiple emesis  6/5 cont NGT 6/6 tx to med surg 6/9: taken back to OR for perforated bowel, post-op shock 6/10: Norepinephrine , Phenylephrine , and vasopressin , added steroids, 1u PRBCs, back to OR for washout and closure 6/11: Worsening AKI with fluid overload, start CRRT, decreasing pressor requirements 6/13 tolerating CRRT 6/14: Off CRRT, arousable.  Tolerated weaning for most of the day 6/13 6/14: Extubated, CRRT stopped. 6/15 transferred to [facility] for iHD; montoring for renal recovery 6/16 iHD, severely encephalopathic with high BUN 6/17 restarted CRRT for to ongoing uremia and encephalopathy 6/18 was awake enough to work with PT, OT 6/23 neph tubes placed 6/24 MS imrpoving, Neph tubes in and draining  Assessment and Plan:  Robotic hernia repair--  incisional, parastomal, left inguinal on 5/30 Septic shock due to acute peritonitis caused by bowel perforation - OR 6/9, back to the OR 6/10 for closure and ileal resection and end ileostomy creation - Surgery service remains primary.  Postop management per their team. - Loopogram 05/12/2024  - IV zosyn  3.375 g q8hr   Acute metabolic encephalopathy - Multifactorial due to uremia/ICU delirium - Monitor   Acute kidney injury secondary to ischemic ATN/shock Bilateral hydronephrosis/obstruction - Seen by nephrology and urology. Need both CRRT and HD. Last HD 6/23 - Status post percutaneous nephrostomy tube 6/23 - Nephrology re-consulted 05/12/2024 (holding off of dialysis for now) - Renvela  800 mg PO tid  - Renal diet 05/12/2024   Diabetes type 2 - Novolog  SS q4 hr  - Semglee  25 units sq at bedtime  - Crestor  10 mg PO at bedtime    Acute blood loss anemia Right flank hematoma History of PE, was on Eliquis at home Anemia of Chronic Dz.  - IV heparin  drip  - Aranesp  200 mcg sq every Friday    Chronic HFpEF Essential HTN - Looks euvolemic to hypovolemic, GDMT as tolerated.  Will resume when appropriate   Moderate protein calorie malnutrition Obesity - Diet changed to renal on 05/12/2024 given poor renal function    Deconditioning PT/OT- SNF  Subjective: Pt seen and examined at the bedside. Nephrology consulted today (holding off of dialysis for now). Diet changed to renal given the pt's current kidney function. Further management per Surgery and urology.   Physical Exam: Vitals:   05/11/24 1534 05/11/24 2229 05/12/24 0318 05/12/24 1155  BP: 114/82 120/81 105/74 111/77  Pulse: (!) 108 (!) 104 (!) 105 (!) 106  Resp:  14 19 14   Temp: 98.6 F (37 C) 98.2 F (36.8 C) 98.1 F (36.7 C) 97.7 F (36.5 C)  TempSrc: Axillary Axillary Oral Axillary  SpO2: 97% 97% 94% 96%  Weight:      Height:       Physical Exam HENT:     Head: Normocephalic.   Cardiovascular:     Rate and  Rhythm: Tachycardia present.  Pulmonary:     Effort: Pulmonary effort is normal.  Abdominal:     Palpations: Abdomen is soft.   Skin:    General: Skin is warm.   Neurological:     Mental Status: He is alert. Mental status is at baseline.   Psychiatric:        Mood and Affect: Mood normal.    Disposition: Status is: Inpatient Remains inpatient appropriate because: Serial labs, ongoing surgery followup  Planned Discharge Destination: Barriers to discharge: As above    Time spent: 35 minutes  Author: Shelise Maron , MD 05/12/2024 12:38 PM  For on call review [email or web address].

## 2024-05-12 NOTE — Plan of Care (Signed)
  Problem: Education: Goal: Knowledge of General Education information will improve Description: Including pain rating scale, medication(s)/side effects and non-pharmacologic comfort measures Outcome: Progressing   Problem: Clinical Measurements: Goal: Respiratory complications will improve Outcome: Progressing   Problem: Pain Managment: Goal: General experience of comfort will improve and/or be controlled Outcome: Progressing   Problem: Safety: Goal: Ability to remain free from injury will improve Outcome: Progressing

## 2024-05-12 NOTE — Progress Notes (Signed)
 Calorie Count Note  Pt in fluroscopy during visit. Wife was in pt's room, she states pt was doing good with his intake until this morning where he started having nausea and has had poor appetite. Surgical MD adding Reglan . Had 50% eggs and potatoes with sips of orange juice this morning for breakfast. Pt is getting disinterested in Ensures because they are too sweet. RD provided tips on how to make Ensures less sweet. Encouraged pt's wife to bring in food if able.   48 hour calorie count ordered.  Diet: Renal diet, thin liquids, 1200 mL fluid restriction  Supplements: Ensure High Protein   6/30:  Breakfast: 0%  - Multiple providers came in and pt was not able to eat breakfast.  Lunch: 1/2 chicken wrap from [employer], 4 waffle fries, bites of a chocolate chip cookie (400 kcal, 22 gm protein) Dinner: 25% chicken with rice, 100% green beans, 100% side salad, 100% fruit (175 kcal, 10 gm protein) Supplements: 1 Ensure (350 kcal, 20 gm protein)  Total intake: 925  kcal (42% of minimum estimated needs)  52 gm protein (43% of minimum estimated needs)  Estimated Nutritional Needs:  Kcal:  2200-2500 kcal/d Protein:  120-140g/d Fluid:  2.2-2.5L/d   Intervention:  Continue current diet as ordered Encourage PO intake Encourage family to bring in food for pt  Room service with assist  Continue Ensure Plus High Protein po BID, each supplement provides 350 kcal and 20 grams of protein Continue 1 packet Juven BID PO, each packet provides 95 calories, 2.5 grams of protein (collagen) + micronutrients to support wound healing Continue the following until pt's PO intake improves per MD.  Vital 1.5 at  80 x 8 hours (640 ml per day) Prosource TF20 60 ml BID   Provides 1,120 kcal (51% of needs), 83 gm protein (69% of needs), 489 ml free water  daily    NUTRITION DIAGNOSIS:    Increased nutrient needs related to acute illness as evidenced by estimated needs. - Still applicable    GOAL:    Patient  will meet greater than or equal to 90% of their needs - Meeting via TF's  Olivia Kenning, RD Registered Dietitian  See Amion for more information

## 2024-05-12 NOTE — Plan of Care (Signed)
  Problem: Education: Goal: Knowledge of General Education information will improve Description: Including pain rating scale, medication(s)/side effects and non-pharmacologic comfort measures Outcome: Progressing   Problem: Clinical Measurements: Goal: Ability to maintain clinical measurements within normal limits will improve Outcome: Progressing Goal: Respiratory complications will improve Outcome: Progressing Goal: Cardiovascular complication will be avoided Outcome: Progressing   Problem: Activity: Goal: Risk for activity intolerance will decrease Outcome: Progressing   Problem: Nutrition: Goal: Adequate nutrition will be maintained Outcome: Progressing   Problem: Pain Managment: Goal: General experience of comfort will improve and/or be controlled Outcome: Progressing   Problem: Skin Integrity: Goal: Risk for impaired skin integrity will decrease Outcome: Progressing

## 2024-05-12 NOTE — Progress Notes (Signed)
 Pt off unit to fluoroscopy for loopogram.

## 2024-05-12 NOTE — Progress Notes (Signed)
 Order to change Left and Right JP drains to biliary gravity drainage bags. RN unable to connect gravity bags to current JP tubes due to there being no leur-lock on JP tubings, and unable to securely keep the bags in place. JP drians uncharged and to gravity at this time. Dr. Sheldon notified, awaiting response. Charge nurse aware.

## 2024-05-12 NOTE — Progress Notes (Signed)
 Pt return to unit from fluoroscopy.

## 2024-05-12 NOTE — Progress Notes (Signed)
 Patient ID: Kristopher Thompson, male   DOB: [birth year], 73 y.o.   MRN: [id number]  73 y.o. yo male  with  hypertension, DM, CAD, hx bladder cancer, CHF, right bundle branch block who had acute bowel perforation and status post OR for bowel repair on 6/8, lysis of adhesion, ileal resection end ileostomy hernia repairs, septic shock seen for AKI and oliguria.   Assessment/ Plan:   # Acute kidney injury, oliguric with anasarca likely ischemic ATN due to septic shock and obstructive uropathy given bilateral hydronephrosis.  CRRT 6/11-6/14 -  Because of the patient's persistent altered mental status and high metabolic state with elevated BUN was put back on CRRT on 6/17- 6/20 -  then taken off- with rising BUN and crt .  Had HD 05/04/24 without UF or complications.  His UOP and BUN/Cr had continued to improve without HD to a nadir Cr of 1.94 on 6/26 but since that time creatinine in the 2.3-2.6 range with BUN slowly rising to 105 at time of reconsultation. Temp cath removed on 6/25.   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  Hopefully his renal function will slowly improve with better urine output.  Discussed with spouse who was bedside and also patient, they would both like to hold off on dialysis unless absolutely necessary.  # Bilateral hydronephrosis severe on the left side: S/p revision of ileal conduit 5/30,  urology following ->  s/p bilateral percutaneous nephrostomy tube placement by IR on 05/04/24 to divert urine.   # Acute bowel perforation is status post bowel repair ileal conduit and hernia repairs urostomy ileal  conduit revision.  As per surgical team.    # HTN: Continue current medications and adjust volume status with dialysis as tolerated   # Acute hypoxic respiratory failure/VDRF per pulmonary team. Extubated 6/14, currently on nasal cannula   # Altered mental status: Likely multifactorial.  Uremia may have played some role.  Improving slowly -  a little more somnolent today - uremia may have something to do with it    # Anemia-  added ESA -  iron  stores OK   # Hyperkalemia-  given lokelma - and follow after HD and PCN placement, now resolved  S: No new complaints O:BP 105/74 (BP Location: Left Arm)   Pulse (!) 105   Temp 98.1 F (36.7 C) (Oral)   Resp 19   Ht 5' 11 (1.803 m)   Wt 99.3 kg   SpO2 94%   BMI 30.53 kg/m   Intake/Output Summary (Last 24 hours) at 05/12/2024 1016 Last data filed at 05/12/2024 9366 Gross per 24 hour  Intake 5031.9 ml  Output 2990 ml  Net 2041.9 ml   Intake/Output: I/O last 3 completed shifts: In: 5031.9 [I.V.:630.9; NG/GT:3827.3; IV Piggyback:573.6] Out: 4940 [Urine:1820; Drains:2330; Stool:790]  Intake/Output this shift:  No intake/output data recorded. Weight change:  Gen: NAD CVS: RRR Resp:CTA Abd: +BS, soft, ostomy in RLQ, wound vac in place Ext: no edema  Recent Labs  Lab 05/06/24 0500 05/07/24 0415 05/08/24 0901 05/09/24 0830 05/10/24 0600 05/11/24  0000 05/11/24 0500 05/12/24 0412  NA 132* 135 133* 132* 132* 133* 132* 132*  K 3.7 4.2 4.5 4.6 4.8 4.4 4.9 4.3  CL 96* 103 99 98 100 100 99 99  CO2 24 21* 21* 20* 18* 20* 19* 20*  GLUCOSE 154* 143* 105* 139* 100* 147* 183* 157*  BUN 78* 74* 90* 89* 89* 96* 95* 105*  CREATININE 2.27* 1.94* 2.56* 2.84* 2.29* 2.52* 2.61* 2.60*  ALBUMIN  2.5* 2.6*  --  2.6* 2.9* 3.0* 3.0* 3.0*  CALCIUM  8.6* 9.0 9.4 9.2 9.7 9.9 9.8 9.8  PHOS 3.9 3.4  --  4.7* 4.7*  --  1.2* 6.9*  AST  --   --   --   --   --  34  --   --   ALT  --   --   --   --   --  58*  --   --    Liver Function Tests: Recent Labs   Lab 05/11/24 0000 05/11/24 0500 05/12/24 0412  AST 34  --   --   ALT 58*  --   --   ALKPHOS 93  --   --   BILITOT 0.7  --   --   PROT 7.0  --   --   ALBUMIN  3.0* 3.0* 3.0*   No results for input(s): LIPASE, AMYLASE in the last 168 hours. No results for input(s): AMMONIA in the last 168 hours. CBC: Recent Labs  Lab 05/08/24 0631 05/09/24 1351 05/10/24 0600 05/11/24 0500 05/12/24 0412  WBC 12.3* 11.5* 11.3* 9.4 9.3  HGB 9.9* 10.2* 10.5* 10.9* 11.4*  HCT 31.1* 31.5* 32.8* 34.2* 36.2*  MCV 94.2 91.3 94.3 92.4 93.5  PLT 113* 124* 131* 145* 147*   Cardiac Enzymes: No results for input(s): CKTOTAL, CKMB, CKMBINDEX, TROPONINI in the last 168 hours. CBG: Recent Labs  Lab 05/11/24 1553 05/11/24 2007 05/11/24 2301 05/12/24 0319 05/12/24 0755  GLUCAP 121* 155* 115* 194* 130*    Iron  Studies: No results for input(s): IRON , TIBC, TRANSFERRIN, FERRITIN in the last 72 hours. Studies/Results: IR NEPHROSTOMY EXCHANGE BILATERAL Result Date: 05/11/2024 INDICATION: Status post prior cystectomy with ileal conduit formation. Recent bowel surgery for bowel perforation. Concern for possible urine leak. EXAM: 1. LEFT PERCUTANEOUS NEPHROSTOMY TUBE EXCHANGE UNDER FLUOROSCOPY INCLUDING FULL ANTEGRADE NEPHROSTOGRAM 2. RIGHT PERCUTANEOUS NEPHROSTOMY TUBE EXCHANGE UNDER FLUOROSCOPY INCLUDING FULL ANTEGRADE NEPHROSTOGRAM COMPARISON:  None Available. MEDICATIONS: None ANESTHESIA/SEDATION: None CONTRAST:  50 mL Omnipaque  300-administered into the collecting system(s) FLUOROSCOPY: Radiation Exposure Index (as provided by the fluoroscopic device): 111 mGy Kerma COMPLICATIONS: None immediate. PROCEDURE: Informed written consent was obtained from the patient after a thorough discussion of the procedural risks, benefits and alternatives. All questions were addressed. Maximal Sterile Barrier Technique was utilized including caps, mask, sterile gowns, sterile gloves, sterile drape, hand  hygiene and skin antiseptic. A timeout was performed prior to the initiation of the procedure. Both percutaneous nephrostomy tubes were injected with contrast material under fluoroscopy and multiple fluoroscopy loops and images were saved. The left percutaneous nephrostomy tube was then removed over a guidewire and exchanged for a 5 French catheter over a wire. The catheter was further advanced into the left ureter and additional nephrostogram performed to assess ureteral patency. A new 10 French percutaneous nephrostomy tube was then formed over a guidewire in the left renal pelvis A new 10 French right percutaneous nephrostomy tube was then exchanged over a guidewire and formed in the renal pelvis. Both nephrostomy tubes were secured at the  skin with Prolene retention sutures, adhesive StatLock devices and attached to new gravity drainage bags. FINDINGS: Injection of the left percutaneous nephrostomy tube demonstrates retraction of the tube which is peripherally located in a lower pole calyx. Contrast injection demonstrates left-sided hydronephrosis and poor drainage into the ileal conduit with suggestion of a distal ureteral stricture. This was confirmed by advancement of a 5 French catheter into the distal ureter with contrast injection demonstrating a high-grade stricture but flow of contrast through the stricture and into the ileal conduit once a catheter was advanced near the level of the stricture. There is no evidence of left-sided urine leak. The left nephrostomy tube was replaced with a new 10 French catheter formed at the level of the renal pelvis. A right nephrostogram demonstrates retraction the right nephrostomy tube into a lower pole calyx. Contrast injection demonstrates good flow of contrast into the right ureter and ileal conduit without evidence of ureteral stricture or urine leak. The right nephrostomy tube was replaced with a new 10 French catheter formed at the level of the renal pelvis.  IMPRESSION: 1. 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 Injection of contrast at the level of the distal ureter does show flow through the stricture and into the ileal conduit. No left-sided urine leak demonstrated. 2. Right nephrostogram demonstrates normally patent ureter and ureteral anastomosis with the ileal conduit. No right-sided urine leak demonstrated. 3. Both nephrostomy tubes had retracted into lower pole calices and were replaced with new 10 French catheters formed at the level of the renal pelvis bilaterally. Electronically Signed   By: Marcey Moan M.D.   On: 05/11/2024 12:35    Chlorhexidine  Gluconate Cloth  6 each Topical Daily   vitamin B-12  1,000 mcg Oral Daily   darbepoetin (ARANESP ) injection - DIALYSIS  200 mcg Subcutaneous Q Fri-1800   feeding supplement  237 mL Oral BID BM   feeding supplement (PROSource TF20)  60 mL Per Tube BID   feeding supplement (VITAL 1.5 CAL)  1,000 mL Per Tube Q24H   insulin  aspart  0-20 Units Subcutaneous Q4H   insulin  glargine-yfgn  25 Units Subcutaneous QHS   melatonin  5 mg Oral QHS   metoCLOPramide   5 mg Oral TID AC & HS   multivitamin with minerals  1 tablet Oral Daily   nutrition supplement (JUVEN)  1 packet Oral BID BM   mouth rinse  15 mL Mouth Rinse 4 times per day   polycarbophil  625 mg Oral BID   prochlorperazine   10 mg Intravenous Once   rosuvastatin   10 mg Oral QPM   sevelamer  carbonate  800 mg Oral TID WC   sodium chloride  (PF)  10 mL Intravenous Once   sodium chloride  flush  5 mL Intracatheter Q8H    BMET    Component Value Date/Time   NA 132 (L) 05/12/2024 0412   K 4.3 05/12/2024 0412   CL 99 05/12/2024 0412   CO2 20 (L) 05/12/2024 0412   GLUCOSE 157 (H) 05/12/2024 0412   BUN 105 (H) 05/12/2024 0412   CREATININE 2.60 (H)  05/12/2024 0412   CALCIUM  9.8 05/12/2024 0412   GFRNONAA 26 (L) 05/12/2024 0412   GFRAA >60 12/09/2018 0549   CBC    Component Value Date/Time   WBC 9.3 05/12/2024 0412   RBC 3.87 (L) 05/12/2024 9587  HGB 11.4 (L) 05/12/2024 0412   HCT 36.2 (L) 05/12/2024 0412   PLT 147 (L) 05/12/2024 0412   MCV 93.5 05/12/2024 0412   MCH 29.5 05/12/2024 0412   MCHC 31.5 05/12/2024 0412   RDW 19.3 (H) 05/12/2024 0412   LYMPHSABS 0.8 04/21/2024 1715   MONOABS 0.9 04/21/2024 1715   EOSABS 0.0 04/21/2024 1715   BASOSABS 0.1 04/21/2024 1715

## 2024-05-12 NOTE — Progress Notes (Signed)
 PHARMACY - ANTICOAGULATION CONSULT NOTE  Pharmacy Consult for Heparin  Indication: h/o PE  Allergies  Allergen Reactions   Demerol  [Meperidine ] Nausea And Vomiting and Other (See Comments)    SEVERE NAUSEA    Patient Measurements: Height: 5' 11 (180.3 cm) Weight: 99.3 kg (218 lb 14.7 oz) IBW/kg (Calculated) : 75.3 HEPARIN  DW (KG): 100.8  Vital Signs: Temp: 98.1 F (36.7 C) (07/01 0318) Temp Source: Oral (07/01 0318) BP: 105/74 (07/01 0318) Pulse Rate: 105 (07/01 0318)  Labs: Recent Labs    05/10/24 0600 05/11/24 0000 05/11/24 0500 05/12/24 0412  HGB 10.5*  --  10.9* 11.4*  HCT 32.8*  --  34.2* 36.2*  PLT 131*  --  145* 147*  HEPARINUNFRC 0.52  --  0.27* 0.40  CREATININE 2.29* 2.52* 2.61* 2.60*    Estimated Creatinine Clearance: 31.3 mL/min (A) (by C-G formula based on SCr of 2.6 mg/dL (H)).  Assessment:  Patient with prolonged hospitalization including post-op complications resulting in bowel perforation with open abdomen. Has been closed and transferred to [facility]. On Eliquis PTA for hx of PE. Has been on heparin  gtt since 6/6.   Heparin  level back to therapeutic range today.  CBC stable/improving, no bleeding reported.  Goal of Therapy:  Heparin  level 0.3-0.7 units/ml Monitor platelets by anticoagulation protocol: Yes   Plan:  Continue heparin  gtt at 1900 units/hr  Daily heparin  level and CBC daily Monitor for s/sx of bleeding Continue to hold DOAC because patient might still need a G-tube  Bastien Strawser D. Lendell, PharmD, BCPS, BCCCP 05/12/2024, 11:30 AM

## 2024-05-13 ENCOUNTER — Inpatient Hospital Stay (HOSPITAL_COMMUNITY)

## 2024-05-13 DIAGNOSIS — K631 Perforation of intestine (nontraumatic): Secondary | ICD-10-CM | POA: Diagnosis not present

## 2024-05-13 HISTORY — PX: IR URETERAL STENT PLACEMENT EXISTING ACCESS LEFT: IMG6073

## 2024-05-13 HISTORY — PX: IR URETERAL STENT RIGHT NEW ACCESS W/SEP NEPHROSTOMY CATH: IMG6078

## 2024-05-13 LAB — CBC
HCT: 37.4 % — ABNORMAL LOW (ref 39.0–52.0)
Hemoglobin: 12.1 g/dL — ABNORMAL LOW (ref 13.0–17.0)
MCH: 30 pg (ref 26.0–34.0)
MCHC: 32.4 g/dL (ref 30.0–36.0)
MCV: 92.6 fL (ref 80.0–100.0)
Platelets: 191 10*3/uL (ref 150–400)
RBC: 4.04 MIL/uL — ABNORMAL LOW (ref 4.22–5.81)
RDW: 19.2 % — ABNORMAL HIGH (ref 11.5–15.5)
WBC: 11 10*3/uL — ABNORMAL HIGH (ref 4.0–10.5)
nRBC: 0 % (ref 0.0–0.2)

## 2024-05-13 LAB — GLUCOSE, CAPILLARY
Glucose-Capillary: 203 mg/dL — ABNORMAL HIGH (ref 70–99)
Glucose-Capillary: 103 mg/dL — ABNORMAL HIGH (ref 70–99)
Glucose-Capillary: 144 mg/dL — ABNORMAL HIGH (ref 70–99)
Glucose-Capillary: 111 mg/dL — ABNORMAL HIGH (ref 70–99)
Glucose-Capillary: 117 mg/dL — ABNORMAL HIGH (ref 70–99)
Glucose-Capillary: 114 mg/dL — ABNORMAL HIGH (ref 70–99)

## 2024-05-13 LAB — HEPARIN LEVEL (UNFRACTIONATED): Heparin Unfractionated: 0.53 [IU]/mL (ref 0.30–0.70)

## 2024-05-13 LAB — RENAL FUNCTION PANEL
Albumin: 3.1 g/dL — ABNORMAL LOW (ref 3.5–5.0)
Anion gap: 14 (ref 5–15)
BUN: 107 mg/dL — ABNORMAL HIGH (ref 8–23)
CO2: 16 mmol/L — ABNORMAL LOW (ref 22–32)
Calcium: 10.3 mg/dL (ref 8.9–10.3)
Chloride: 101 mmol/L (ref 98–111)
Creatinine, Ser: 2.26 mg/dL — ABNORMAL HIGH (ref 0.61–1.24)
GFR, Estimated: 30 mL/min — ABNORMAL LOW (ref >60)
Glucose, Bld: 166 mg/dL — ABNORMAL HIGH (ref 70–99)
Phosphorus: 5.8 mg/dL — ABNORMAL HIGH (ref 2.5–4.6)
Potassium: 4.5 mmol/L (ref 3.5–5.1)
Sodium: 131 mmol/L — ABNORMAL LOW (ref 135–145)

## 2024-05-13 LAB — PHOSPHORUS: Phosphorus: 5.8 mg/dL — ABNORMAL HIGH (ref 2.5–4.6)

## 2024-05-13 LAB — MAGNESIUM: Magnesium: 2.3 mg/dL (ref 1.7–2.4)

## 2024-05-13 MED ORDER — FENTANYL CITRATE (PF) 100 MCG/2ML IJ SOLN
INTRAMUSCULAR | Status: AC | PRN
  Administered 2024-05-13: 25 ug via INTRAVENOUS
  Administered 2024-05-13 (×3): 50 ug via INTRAVENOUS
  Administered 2024-05-13: 25 ug via INTRAVENOUS

## 2024-05-13 MED ORDER — FENTANYL CITRATE (PF) 100 MCG/2ML IJ SOLN
INTRAMUSCULAR | Status: AC
Start: 2024-05-13 — End: 2024-05-13
  Filled 2024-05-13: qty 2

## 2024-05-13 MED ORDER — HEPARIN (PORCINE) 25000 UT/250ML-% IV SOLN
2000.0000 [IU]/h | INTRAVENOUS | Status: DC
  Administered 2024-05-13 – 2024-05-14 (×2): 1900 [IU]/h via INTRAVENOUS
  Filled 2024-05-13: qty 250

## 2024-05-13 MED ORDER — MIDAZOLAM HCL 2 MG/2ML IJ SOLN
INTRAMUSCULAR | Status: AC
  Filled 2024-05-13: qty 2

## 2024-05-13 MED ORDER — LIDOCAINE HCL (PF) 1 % IJ SOLN
10.0000 mL | Freq: Once | INTRAMUSCULAR | Status: AC
  Administered 2024-05-13: 10 mL
  Filled 2024-05-13: qty 10
  Filled 2024-05-13: qty 30

## 2024-05-13 MED ORDER — FENTANYL CITRATE (PF) 100 MCG/2ML IJ SOLN
INTRAMUSCULAR | Status: AC
  Filled 2024-05-13: qty 2

## 2024-05-13 MED ORDER — LIDOCAINE HCL 1 % IJ SOLN
INTRAMUSCULAR | Status: AC
Start: 2024-05-13 — End: 2024-05-13
  Filled 2024-05-13: qty 20

## 2024-05-13 MED ORDER — IOHEXOL 300 MG/ML  SOLN
50.0000 mL | Freq: Once | INTRAMUSCULAR | Status: AC | PRN
  Administered 2024-05-13: 30 mL

## 2024-05-13 MED ORDER — MIDAZOLAM HCL 2 MG/2ML IJ SOLN
INTRAMUSCULAR | Status: AC | PRN
  Administered 2024-05-13: .5 mg via INTRAVENOUS
  Administered 2024-05-13 (×3): 1 mg via INTRAVENOUS
  Administered 2024-05-13: .5 mg via INTRAVENOUS

## 2024-05-13 NOTE — Progress Notes (Signed)
 Patient ID: Kristopher Thompson, male   DOB: [birth year], 73 y.o.   MRN: [id number]  22 Days Post-Op Subjective: Kristopher Thompson is feeling pretty good this morning.  Denies specific pain.  RLQ drain taken off suction yesterday with decreased drain output and improved UOP.    Objective: Vital signs in last 24 hours: Temp:  [97.6 F (36.4 C)-98 F (36.7 C)] 97.6 F (36.4 C) (07/02 0301) Pulse Rate:  [106-111] 107 (07/02 0301) Resp:  [14-17] 17 (07/02 0301) BP: (111-123)/(77-83) 115/83 (07/02 0301) SpO2:  [96 %-99 %] 99 % (07/02 0301)  Intake/Output from previous day: 07/01 0701 - 07/02 0700 In: -  Out: 3470 [Urine:2225; Drains:220; Stool:1025] Intake/Output this shift: No intake/output data recorded.  Physical Exam:  General: Alert and oriented GU: B PCNs draining well with clear urine, some clear urine in urostomy bag and stoma still pink and viable  Lab Results: Recent Labs    05/11/24 0500 05/12/24 0412 05/13/24 0500  HGB 10.9* 11.4* 12.1*  HCT 34.2* 36.2* 37.4*      Latest Ref Rng & Units 05/13/2024    5:00 AM 05/12/2024    4:12 AM 05/11/2024    5:00 AM  CBC  WBC 4.0 - 10.5 K/uL 11.0  9.3  9.4   Hemoglobin 13.0 - 17.0 g/dL 87.8  88.5  89.0   Hematocrit 39.0 - 52.0 % 37.4  36.2  34.2   Platelets 150 - 400 K/uL 191  147  145      BMET Recent Labs    05/12/24 0412 05/13/24 0500  NA 132* 131*  K 4.3 4.5  CL 99 101  CO2 20* 16*  GLUCOSE 157* 166*  BUN 105* 107*  CREATININE 2.60* 2.26*  CALCIUM  9.8 10.3     Studies/Results: DG Loopogram Result Date: 05/12/2024 CLINICAL DATA:  [id number] Complication of Ileal conduit [facility]) [id number] Patient with ileal conduit, s/p recent nephrostogram with Dr. Luverne at time of nephrostomy exchange. Concern for conduit leak due to presence of urine in surgical drains. Team has requested introduction of contrast into the conduit to be followed by CT imaging. EXAM: WATER  SOLUBLE CONTRAST LOOPOGRAM TECHNIQUE: The urostomy bag was removed as his  current device did not allow entry from port used to empty the collection bag. The skin was cleansed and a scout image was obtained. An 8 Fr Foley was inflated outside of the urostomy. The tip was inserted and the balloon used to gently occlude the stoma. Omni 300 was introduced through the catheter. This technique was not adequate to prevent leakage. After discussion with Dr. Hughes, the 8 Fr Foley was introduced several cm into the stoma and balloon inflated with 5 mL saline within the stoma. Contrast was then introduced without leakage. Approximately 70 mL of water  soluble contrast ws introduce to the system; at that time, leakage around the stoma was noted and instillation was stopped. Fluoroscopy images were obtained throughout the procedure. The balloon was completely deflated and removed from the stoma. FLUOROSCOPY: Radiation Exposure Index and estimated peak skin dose (PSD); Reference air kerma (RAK), 6.5 mGy. Kerma-area product (KAP), 217.5 uGy*m. COMPARISON:  05/11/24 Nephrostogram FINDINGS: 1. Contrast introduced via catheter is seen within the ileal conduit and retrograde opacification of the RIGHT renal collecting system. 2. The LEFT renal collecting system did not opacify. Patient to immediately transfer to CT imaging area for CT AP for further evaluation. IMPRESSION: 1. Contrast filling within the ileal conduit and retrograde opacification of the RIGHT renal collecting system. 2. The LEFT  renal collecting system did not opacify. Electronically Signed   By: Thom Hall M.D.   On: 05/12/2024 16:33   CT ABDOMEN PELVIS WO CONTRAST Result Date: 05/12/2024 CLINICAL DATA:  Status post prior cystectomy with ileal conduit formation. Recent surgery for bowel perforation. Suspected urine leak near the level ileal conduit due to surgical drain fluid return demonstrating elevated creatinine. EXAM: CT ABDOMEN AND PELVIS WITHOUT CONTRAST TECHNIQUE: Multidetector CT imaging of the abdomen and pelvis was performed  following the standard protocol without IV contrast. Prior to the CT study, contrast was injected via a right lower quadrant urostomy into the ileal conduit RADIATION DOSE REDUCTION: This exam was performed according to the departmental dose-optimization program which includes automated exposure control, adjustment of the mA and/or kV according to patient size and/or use of iterative reconstruction technique. COMPARISON:  Recent CT studies on 05/03/2024 and 04/19/2024 FINDINGS: Lower chest: No acute abnormality. Hepatobiliary: No focal liver abnormality is seen. Status post cholecystectomy. No biliary dilatation. Pancreas: Unremarkable. No pancreatic ductal dilatation or surrounding inflammatory changes. Spleen: Normal in size without focal abnormality. Adrenals/Urinary Tract: Bilateral percutaneous nephrostomy tubes are present with pigtail portions formed in the renal pelvis bilaterally. There is some distension of the right renal collecting system with contrast due to free reflux of contrast injected into the ileal conduit with contrast opacification of the entire right ureter and renal collecting system. 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 This stenosis was characterized by nephrostogram yesterday. Status post cystectomy. Stomach/Bowel: Ileal conduit is well distended with injected contrast. The urostomy itself appears focally stenotic just before the abdominal wall within the subcutaneous fat. There then is another segment of stenosis right at the abdominal wall and extending just into the peritoneal cavity where there may be a very subtle pinpoint leak extending laterally over approximately 2 cm and paralleling just superior to one of the surgical drains that originates in the fall left lateral abdomen and ultimately crosses over into the far right lateral lower abdomen. There is an additional  more obvious leak along the inferior aspect of the conduit on axial images 75-83 and also seen well on coronal reconstructions beginning as a thin column of extravasated injected contrast measuring approximately 4-5 mm in width and extending into the right lower abdominal wall at the level of one of the transversely oriented surgical drains. No evidence of bowel obstruction, ileus or free intraperitoneal air. Feeding tube extends into the mid stomach. Vascular/Lymphatic: No significant vascular findings are present. No enlarged abdominal or pelvic lymph nodes. Reproductive: No significant findings. Other: Edema of the right lower muscle wall and some ill-defined fluid in the right lower abdominal wall which appears stable since the prior CT. Musculoskeletal: No acute or significant osseous findings. IMPRESSION: 1. 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 There then is another segment of stenosis right at the abdominal wall and extending just into the peritoneal cavity where there may be a very subtle pinpoint leak extending laterally over approximately 2 cm and paralleling just superior to one of the surgical drains that originates in the fall left lateral abdomen and ultimately crosses over into the far right lateral lower abdomen. 2. There is an additional more obvious leak along the inferior aspect of the conduit beginning as a thin column of extravasated injected contrast  measuring approximately 4-5 mm in width and extending into the right lower abdominal wall at the level of one of the transversely oriented surgical drains. 3. Bilateral percutaneous nephrostomy tubes are present with pigtail portions formed in the renal pelvis bilaterally. There is some distension of the right renal collecting system with contrast due to free reflux of contrast injected into the ileal conduit with contrast  opacification of the entire right ureter and renal collecting system. 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 This stenosis was characterized by nephrostogram yesterday. 4. Edema of the right lower muscle wall and some ill-defined fluid in the right lower abdominal wall which appears stable since the prior CT. Electronically Signed   By: Marcey Moan M.D.   On: 05/12/2024 14:32   IR NEPHROSTOMY EXCHANGE BILATERAL Result Date: 05/11/2024 INDICATION: Status post prior cystectomy with ileal conduit formation. Recent bowel surgery for bowel perforation. Concern for possible urine leak. EXAM: 1. LEFT PERCUTANEOUS NEPHROSTOMY TUBE EXCHANGE UNDER FLUOROSCOPY INCLUDING FULL ANTEGRADE NEPHROSTOGRAM 2. RIGHT PERCUTANEOUS NEPHROSTOMY TUBE EXCHANGE UNDER FLUOROSCOPY INCLUDING FULL ANTEGRADE NEPHROSTOGRAM COMPARISON:  None Available. MEDICATIONS: None ANESTHESIA/SEDATION: None CONTRAST:  50 mL Omnipaque  300-administered into the collecting system(s) FLUOROSCOPY: Radiation Exposure Index (as provided by the fluoroscopic device): 111 mGy Kerma COMPLICATIONS: None immediate. PROCEDURE: Informed written consent was obtained from the patient after a thorough discussion of the procedural risks, benefits and alternatives. All questions were addressed. Maximal Sterile Barrier Technique was utilized including caps, mask, sterile gowns, sterile gloves, sterile drape, hand hygiene and skin antiseptic. A timeout was performed prior to the initiation of the procedure. Both percutaneous nephrostomy tubes were injected with contrast material under fluoroscopy and multiple fluoroscopy loops and images were saved. The left percutaneous nephrostomy tube was then removed over a guidewire and exchanged for a 5 French catheter over a wire. The catheter was further advanced into the left ureter and additional nephrostogram performed  to assess ureteral patency. A new 10 French percutaneous nephrostomy tube was then formed over a guidewire in the left renal pelvis A new 10 French right percutaneous nephrostomy tube was then exchanged over a guidewire and formed in the renal pelvis. Both nephrostomy tubes were secured at the skin with Prolene retention sutures, adhesive StatLock devices and attached to new gravity drainage bags. FINDINGS: Injection of the left percutaneous nephrostomy tube demonstrates retraction of the tube which is peripherally located in a lower pole calyx. Contrast injection demonstrates left-sided hydronephrosis and poor drainage into the ileal conduit with suggestion of a distal ureteral stricture. This was confirmed by advancement of a 5 French catheter into the distal ureter with contrast injection demonstrating a high-grade stricture but flow of contrast through the stricture and into the ileal conduit once a catheter was advanced near the level of the stricture. There is no evidence of left-sided urine leak. The left nephrostomy tube was replaced with a new 10 French catheter formed at the level of the renal pelvis. A right nephrostogram demonstrates retraction the right nephrostomy tube into a lower pole calyx. Contrast injection demonstrates good flow of contrast into the right ureter and ileal conduit without evidence of ureteral stricture or urine leak. The right nephrostomy tube was replaced with a new 10 French catheter formed at the level of the renal pelvis. IMPRESSION: 1. 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 Injection of contrast at the level of the distal ureter does show flow through the stricture and into the ileal conduit. No left-sided urine leak demonstrated. 2. Right nephrostogram demonstrates normally patent  ureter and ureteral anastomosis with the ileal conduit. No right-sided urine leak demonstrated. 3. Both nephrostomy tubes had retracted into lower pole calices and were replaced with new 10 French catheters formed at the level of the renal pelvis bilaterally. Electronically Signed   By: Marcey Moan M.D.   On: 05/11/2024 12:35    Assessment/Plan: 1) Urine leak s/p h/o prior radical cystectomy/ileal conduit urinary diversion in past: CT loopogram performed yesterday and leak was identified at inferior butt end of ileal conduit.  RLQ drain taken off suction which appears to have been helpful.  Continue nephrostomy drainage.  I spoke with Dr. Jenna last evening and plan for him to attempt right retrograde ureteral stent to further optimize urine diversion away from site of leak.  This is a relatively small leak and may have a chance of healing with optimized diversion and conservative therapy.  Even if he ultimately requires operative repair, he would benefit from doing this in a delayed fashion due to poor nutritional status and reserve right now.  The only indication for operative intervention currently would be if he clinically declined.  2) AKI: Improved/stabilized.  This may reflect less urine absorbed through peritoneum.  Continue bilateral PCN drainage.   LOS: 33 days   Noretta Ferrara 05/13/2024, 8:42 AM

## 2024-05-13 NOTE — Progress Notes (Signed)
 PHARMACY - ANTICOAGULATION CONSULT NOTE  Pharmacy Consult for Heparin  Indication: h/o PE  Allergies  Allergen Reactions   Demerol  [Meperidine ] Nausea And Vomiting and Other (See Comments)    SEVERE NAUSEA    Patient Measurements: Height: 5' 11 (180.3 cm) Weight: 99.3 kg (218 lb 14.7 oz) IBW/kg (Calculated) : 75.3 HEPARIN  DW (KG): 100.8  Vital Signs: BP: 134/94 (07/02 1640) Pulse Rate: 120 (07/02 1640)  Labs: Recent Labs    05/11/24 0500 05/12/24 0412 05/13/24 0500  HGB 10.9* 11.4* 12.1*  HCT 34.2* 36.2* 37.4*  PLT 145* 147* 191  HEPARINUNFRC 0.27* 0.40 0.53  CREATININE 2.61* 2.60* 2.26*    Estimated Creatinine Clearance: 36 mL/min (A) (by C-G formula based on SCr of 2.26 mg/dL (H)).  Assessment:  Patient with prolonged hospitalization including post-op complications resulting in bowel perforation with open abdomen. Has been closed and transferred to [facility]. On Eliquis PTA for hx of PE. Has been on heparin  gtt since 6/6.   Heparin  level remains therapeutic. No bleeding noted. CBC stable.  Goal of Therapy:  Heparin  level 0.3-0.7 units/ml Monitor platelets by anticoagulation protocol: Yes   Plan:  Day PharmD D/w Glennon Bal, PA and okay to restart heparin  ~3 hours post procedure. Restart heparin  gtt at 1900 units/hr at 20:00 Daily heparin  level and CBC daily Monitor for s/sx of bleeding Continue to hold DOAC because patient might still need a G-tube   Aveline Daus BS, PharmD, BCPS Clinical Pharmacist 05/13/2024 5:07 PM  Contact: [phone] after 3 PM  Be curious, not judgmental... -Davina Sprinkles

## 2024-05-13 NOTE — Progress Notes (Signed)
 Nutrition Follow-up  DOCUMENTATION CODES:   Not applicable  INTERVENTION:   Continue current diet as ordered Encourage PO intake Encourage family to bring in food for pt  Room service with assist  Continue Ensure Plus High Protein po BID, each supplement provides 350 kcal and 20 grams of protein Continue 1 packet Juven BID PO, each packet provides 95 calories, 2.5 grams of protein (collagen) + micronutrients to support wound healing Continue MVI with minerals daily  Recommend adding Prosource Plus BID, as pt s pt with limited intake and wounds that require increased protein intake. Each packet contains 100 kcal, 15 gm protein   NUTRITION DIAGNOSIS:   Increased nutrient needs related to acute illness as evidenced by estimated needs. 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 On 5/30 underwent hernia repair with urostomy revision.  5/30 - Admit; s/p lysis of ahdesions, repair of incisional, parastomal, left inguinal incarcerated abdominal wall hernias, urostomy ileal conduit revision; CLD 6/2 - NPO; NGT placed for suction; TPN initiated 6/4 - TPN increased to goal; NGT removed by patient; CLD then back to NPO after emesis; NGT replaced 6/9 - OR in the early AM for ex-lap, drainage of right abdominal wall abscess, explantation fo abdominal mesh, small bowel resection (left In discontinuity), AbThera wound VAC placement; returned to ICU intubated 6/10 - OR for washout and closure 6/11 - Worsening AKI with fluid overload, start CRRT 6/14 - extubated 6/15 - transferred to [facility] 6/16 - TPN discontinued, tolerating TF 6/17 - restarted on CRRT 6/20 - CRRT stopped 6/30 -  s/p replacement of PCN  7/1 - Antegrade nephrogram suspicious of leak, RLQ drain taken off suction,  7/2 - Cortrak removed, attempt ureteral stent   Cortrak removed today per MD, pt glad it is out. RD encouraged pt to increase his PO intake. Is not a fan of the Ensures but says he will drink them if he has too. Recommend adding Prosource Plus as pt with limited intake and wounds that require increased protein intake.Pt had a good dinner last night from [employer]. Encouraged family to bring in food as able.    Pt denies nausea today, Unfortunately pt did not have breakfast this morning because he was NPO for ureteral stent placement. Pt states he was not hungry on visit but most likely will be afterwards. Per MD if pt continues with poor PO intake over the next week, replacement of cortrak feeding tube vs PEG tube 7/8.    UOP improving. Per nephrology, renal function improving.    Calorie Count:   48 hour calorie count ordered.  Day 1 6/30:  Breakfast: 0%  - Multiple providers came in and pt was not able to eat breakfast.  Lunch: 1/2 chicken wrap from [employer], 4 waffle fries, bites of a chocolate chip cookie (400 kcal, 22 gm protein) Dinner: 25% chicken with rice, 100% green beans, 100% side salad, 100% fruit (175 kcal, 10 gm protein) Supplements: 1 Ensure (350 kcal, 20 gm protein)   Total intake: 925  kcal (42% of minimum estimated needs)  52 gm protein (43% of minimum estimated needs)  Day 2: 7/1  Breakfast:50% eggs and potatoes with  sips of orange juice (100 kcal, 6 gm protein) Lunch: 100% Raspberry chocolate yogurt parfait (300 kcal, 10 gm protein) Dinner: [employer]: 50% meatloaf, 25% mashed potatoes, biscuit, and fried apples (412 kcal, 20 gm protein) Supplements: 0  Total intake: 812  kcal (37% of minimum estimated needs)  36 gm protein (30% of minimum estimated needs)  Admit weight: 108.9 kg ? Accuracy, copied forward First measured weight: 98.4 kg  (6/5) Current weight: 99.3 kg  Average Meal Intake: 6/25: 25-33% x 3 meals 6/26: 55-66% x 3 meals    Intake/Output Summary (Last 24 hours) at 05/13/2024 1345 Last data filed at 05/13/2024 1120 Gross per 24 hour  Intake --  Output 3210 ml  Net -3210 ml   Drains/Lines:  - Lt PCN 900 ml/24hr  - Rt PCN 725 ml  - urostomy (ileal conduit) Total UOP  JP Drain, right lateral abdomen, 150 mL x 24 hours JP Drain, left lateral abdomen, 70 mL x 24 hours Ileostomy RUQ, 1,025 mL x 24 hours Wound Vac, upper medial abdomen, 125 mL output x 24 hours  Nutritionally Relevant Medications: Scheduled Meds:  Chlorhexidine  Gluconate Cloth  6 each Topical Daily   vitamin B-12  1,000 mcg Oral Daily   darbepoetin (ARANESP ) injection - DIALYSIS  200 mcg Subcutaneous Q Fri-1800   feeding supplement  237 mL Oral BID BM   feeding supplement (PROSource TF20)  60 mL Per Tube BID   insulin  aspart  0-20 Units Subcutaneous Q4H   insulin  glargine-yfgn  25 Units Subcutaneous QHS   melatonin  5 mg Oral QHS   metoCLOPramide   5 mg Oral TID AC & HS   multivitamin with minerals  1 tablet Oral Daily   nutrition supplement (JUVEN)  1 packet Oral BID BM   mouth rinse  15 mL Mouth Rinse 4 times per day   polycarbophil  625 mg Oral BID   prochlorperazine   10 mg Intravenous Once   rosuvastatin   10 mg Oral QPM   sevelamer  carbonate  800 mg Oral TID WC   sodium chloride  flush  5 mL Intracatheter Q8H   Continuous Infusions:  sodium chloride  Stopped (05/01/24 0014)   albumin  human     anticoagulant sodium citrate      micafungin  (MYCAMINE ) 100 mg in sodium chloride  0.9 % 100 mL IVPB 100 mg (05/13/24 1124)   piperacillin -tazobactam (ZOSYN )  IV 3.375 g (05/13/24 1339)   Labs Reviewed: Sodium 131 BUN 107 Creatinine 2.26 Phosphorus 5.8 Albumin  3.1 GFR 30 CBG ranges from 101-203 mg/dL over the last 24 hours HgbA1c 5.3   Diet Order:   Diet Order             Diet NPO time specified Except for: Sips  with Meds  Diet effective now                   EDUCATION NEEDS:   Not appropriate for education at this time  Skin:  Skin Assessment: Skin Integrity Issues: Skin Integrity Issues:: Stage II, Wound VAC Stage II: Sacrum Wound Vac: Abdomen Incisions: 5/30 abdomen  Last BM:  7/1 via ileostomy  Height:   Ht Readings from Last 1 Encounters:  04/24/24 5' 11 (1.803 m)    Weight:   Wt Readings from Last 1 Encounters:  05/07/24 99.3 kg    Ideal Body Weight:  78.2 kg  BMI:  Body mass index is 30.53 kg/m.  Estimated Nutritional Needs:   Kcal:  2200-2500 kcal/d  Protein:  120-140g/d  Fluid:  2.2-2.5L/d   Olivia Kenning, RD Registered Dietitian  See Amion for more information

## 2024-05-13 NOTE — Progress Notes (Addendum)
 05/13/2024  Kristopher Thompson [id number] October 06, 1951  CARE TEAM: PCP: [name], MD  Outpatient Care Team: Patient Care Team: [name], MD as PCP - General (Internal Medicine) [name], MD (Inactive) as Consulting Physician (Dermatology) [name], MD as Referring Physician (Pulmonary Disease) [name], MD as Consulting Physician (General Surgery) [name], MD as Consulting Physician (Urology)  Inpatient Treatment Team: Treatment Team:  [place], [name], DO [name], MD [name], MD Massie Delaine SAUNDERS, RN Dea Shiner, MD Ruthellen Ruthellen Radiology, MD Rayburn Pac, MD Luverne Aran, MD [name], MD Melia Lynwood ORN, MD Macel Jayson PARAS, MD Theophilus Caren FORBES, RN Clary [name], [place] [name], MD Koonce, Natalie K, OT Sasser, Macario SQUIBB, PT [name], RN Amend, Vito MATSU, [facility] Viktoria Heller I, [place]   Problem List:   Principal Problem:   Delayed bowel perforation s/p SBR/end ileostomy Active Problems:   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 08/08/2017   S/P ileal conduit (HCC)   GERD (gastroesophageal reflux disease)   Obesity (BMI 35.0-39.9 without comorbidity)   Prolonged QT interval   Non-recurrent bilateral inguinal hernia without obstruction or gangrene   Incarcerated incisional hernia   Sinus tachycardia   Tachypnea   Acute respiratory insufficiency, postoperative   Sepsis due to undetermined organism (HCC)   Lactic acidosis   Class 2 obesity   Chronic anticoagulation   Hearing loss   History of bladder cancer   Obstructive sleep apnea of adult   Parastomal hernia of ileal conduit   Partial small bowel obstruction (HCC)   Personal history of PE (pulmonary embolism)   Pressure injury of skin   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   04/10/2024  POST-OPERATIVE DIAGNOSIS:  PARASTOMAL AND INCISIONAL INCARCERATED  ABDOMINAL WALL HERNIAS   PROCEDURE:   -ROBOTIC LYSIS OF ADHESIONS X 4 HOURS -COMPONENT SEPARATION - TRANSVERSUS ABDOMINIS REALEASE (TAR) BILATERAL -ROBOTIC REPAIRS OF  INCISIONAL, PARASTOMAL, LEFT INGUINAL  INCARCERATED ABDOMINAL WALL HERNIAS WITH MESH -UROSTOMY ILEAL CONDUIT REVISION -INTRAOPERATIVE ASSESSMENT OF TISSUE VASCULAR PERFUSION USING ICG (indocyanine green ) -IMMUNOFLUORESCENCE -TRANSVERSUS ABDOMINIS PLANE (TAP) BLOCK - BILATERAL    SURGEON:  [name], MD   04/20/2024 POST-OPERATIVE DIAGNOSIS:  perforated bowel   PROCEDURE:  Drainage of right abdominal wall abscess as well as intra-abdominal abscess Explantation of abdominal mesh Small bowel resection (in discontinuity) Placement of ABThera wound VAC   SURGEON:  [name], MD  04/21/2024  POST-OPERATIVE DIAGNOSIS:  DELAYED BOWEL PERFORATION WITH OPEN ABDOMEN   PROCEDURE:   LYSIS OF ADHESIONS X ABDOMINAL WALL DEBRIDEMENT ILEAL RESECTION END ILEOSTOMY ABDOMINAL WALL PARASTOMAL & INCISIONAL HERNIA REPAIRS WITH PHASIX MESH FASCIA CLOSURE WITH WOUND VAC PLACEMENT IN SQ   SURGEON:  [name], MD    05/04/2024 Interventional Radiology Procedure Note   Procedure: Bilateral percutaneous nephrostomy tube placements   Findings: Please refer to procedural dictation for full description. 10 Fr bilateral to bag drainage.   Complications: None immediate   Estimated Blood Loss: < 5 ml   Recommendations: Keep to bag drainage.  IR will follow.  Ultimate management as per Urology.   Assessment  -Incarcerated parastomal incisional hernias with obstructive symptoms status post robotic takedown and repair -Delayed small bowel perforation s/p ileal resection and end ileostomy -Delayed urine leak of urostomy ileal conduit status post perc nephrostomy tubes -Multisystem organ failure gradually resolving  SLOWLY IMPROVING   Plan:  Delayed urine leak with AKI:  Urinary diversion with percutaneous  nephrostomy tubes.  Adjusted 6/30  Still with urine draining into abdominal wall drain.  A little more coming out the urostomy.  Does not seem to be isolated, but nephrostomy drains, abdominal drain, urostomy allowing urine to come out.  Antegrade nephrogram done 7/1.  Suspicion of a leak in the mid ileal conduit as it starts to go up from retroperitoneum to the anterior abdominal wall.  No obvious mass effect or kinking  If not controlled with urinary diversion and stenting, he may require revision of his ileal conduit.  That would be very high risk.  Understand they wish to hold off but do not want to have nephrostomy tubes indefinitely either if possible.  Defer to Dr. Renda urology with interventional radiology   Still uremic but nonoliguric.  Nephrology saw again 7/1 and are hoping that his uremia will resolve as the urinary strictures and leaks are better palliated with stenting and draining.  No dialysis for now   Nutrition:  Patient's nausea lessened.  Better last night.  I discussed with patient & wife.  He wishes a trial of removal of the feeding tube in the hopes that he can enough on his own.  Since his nutritional numbers are improved (albumin  3, prealbumin 40) reasonable to see if that will suceed.  I removed corpak 7/2 AM  Diet as tolerated.  Nephrology recommends renal diet.  If he has poor PO intake over the next week, will recommend replacement of Corpak vs feeding tube gastrostomy tube 7/8.  Bowel regimen for ileostomy.  soluble polyfiber/FiberCon to thicken with nightly   Weaned off TPN 6/16   Hold off on G-tube unless poor PO effort >14days  Infection due to delayed bowel perforation and abdominal wall infection/necrosis  Growing multiple organisms including Candida albicans, Enterobacter, and Enterococcus.    IV Zosyn /micafungin .  Could consider switching to Bactrim /fluconazole  but concern for QT interval on the fluconazole .  Continue IV for now  Anticipate  prolonged antibiotics given the abdominal wall infection/necrosis and need for Phasix mesh.   Change wound vac q MonThu in the hopes for the subcutaneous tissues to recover.  Last change 6/25 showing early granulation without necrosis guardedly reassuring.      Metabolic encephalopathy resolved.  Follow.  Insomnia a challenge.  Hopefully if we can get the Corpak out, that will help.  Restarted melatonin 6/30.  See what internal medicine thinks.  Patient wife are against doing Ambien .  ABLA on top of anemia of chronic disease improved with transfusion.  Follow.    -monitor electrolytes & replace as needed.  Keep K>4, Mg>2, Phos>3.  Persistent hypophosphatemia.  Pharmacy order direct for supplemental phosphorus.  Reached out to nephrology to see if further adjustments need to made to his chronic phosphorus meds or not  -Diabetes.  Sliding scale insulin .    -VTE prophylaxis-  Full anticoagulation heparin  drip given history of pulmonary embolism,.  Could consider back on DOAC enteral route once procedures done.    -Mobilize as tolerated.  Starting to work with physical and Occupational Therapy more.  Physical therapy in room.  Hopefully can rebuild strength and minimize further atrophy/decline.   I updated the patient's status to the the patient & ICU RN in room.  Discussed with Dr. Annella with critical care.  Recommendations were made.  Questions were answered.  They expressed understanding & appreciation.  -Disposition: Possible transition to inpatient rehab if makes goals.  I would hold off on LTAC.     I reviewed nursing notes,  last 24 h vitals and pain scores, last 48 h intake and output, last 24 h labs and trends, and last 24 h imaging results.  I have reviewed this patient's available data, including medical history, events of note, test results, etc as part of my evaluation.   A significant portion of that time was spent in counseling. Care during the described time interval was  provided by me.  This care required high  level of medical decision making.  05/13/2024    Subjective: (Chief complaint)  Wife nursing and therapies in room.  Felt less nauseated last night.  Ate better dinner.  Still getting tired of Corpak tube.  Denies any abdominal pain.    Objective:  Vital signs:  Vitals:   05/12/24 1910 05/12/24 2305 05/12/24 2306 05/13/24 0301  BP: 113/81 123/81  115/83  Pulse: (!) 108 (!) 111 (!) 109 (!) 107  Resp: 16 16  17   Temp: 98 F (36.7 C) 97.7 F (36.5 C)  97.6 F (36.4 C)  TempSrc: Axillary Axillary  Axillary  SpO2: 98% 98% 99% 99%  Weight:      Height:        Last BM Date : 06/10/24  Intake/Output   Yesterday:  07/01 0701 - 07/02 0700 In: -  Out: 3470 [Urine:2225; Drains:220; Stool:1025] This shift:  No intake/output data recorded.  Bowel function:  Flatus: YES  BM:  YES -small  Drains:  RLQ drain (rests between abdominal wall and Phasix mesh): Serous moderate volume.  LLQ drain (runs over bowel with tip down in RLQ pelvis near base of ileal conduit and site of bowel resection): Serous   Nephrostomy tubes in place.  - light yellow-colored  Physical Exam:  General: Resting in no acute distress.  Legs crossed looking relaxed.  Mentally back to baseline.  Clemens sometime after therapy questions likely sometimes slow to answer but no major confusion  Eyes: PERRL, normal EOM.  Sclera clear.  No icterus Neuro: CN II-XII intact w/o focal sensory/motor deficits. Lymph: No head/neck/groin lymphadenopathy Psych:  No delerium/psychosis/paranoia.  No agitation HENT: Normocephalic, Mucus membranes moist.  No thrush. Mildly hard of hearing Neck: Supple, No tracheal deviation.  No obvious thyromegaly Chest: No pain to chest wall compression.  Good respiratory excursion.   CV:  Pulses intact.  Regular rhythm.  1-2+ BUE/BLE edema MS:  No obvious deformity  Abdomen:  Obese Soft.  Nondistended.  Nontender Wound vac (in SQ over  closed fascia) in midline.    RUQ: (End ileostomy): Pink mucosa.  Brown oatmeal thick effluent in bag RLQ:  (Urostomy ileal conduit): Pink mucosa without any obvious separation.  Moderate clear light yellow-colored urine  Ext:   No deformity.  Bilateral hand 0-1+ edema proved.  No lower extremity edema.  No cyanosis Skin: No petechiae / purpurea.  No major sores.  Warm and dry    Results:   Cultures: Recent Results (from the past 720 hours)  Urine Culture     Status: None   Collection Time: 04/13/24 10:18 AM   Specimen: Urine, Clean Catch  Result Value Ref Range Status   Specimen Description   Final    URINE, CLEAN CATCH Performed at [facility], [address], [place], [place] [address]    Special Requests   Final    NONE Performed at [facility], [address] [place], [place] [address]    Culture   Final    NO GROWTH Performed at [facility], [address] [place], [place] [address]    Report Status 04/14/2024 FINAL  Final  MRSA Next Gen by PCR, Nasal     Status: None   Collection Time: 04/13/24 11:35 AM   Specimen: Nasal Mucosa; Nasal Swab  Result Value Ref Range Status   MRSA by PCR Next Gen NOT DETECTED NOT DETECTED Final    Comment: (NOTE) The GeneXpert MRSA Assay (FDA approved for NASAL specimens only), is one component of a comprehensive MRSA colonization surveillance program. It is not intended to diagnose MRSA infection nor to guide or monitor treatment for MRSA infections. Test performance is not FDA approved in patients less than 71 years old. Performed at [facility], [address] [place], [place] [address]   Culture, blood (Routine X 2) w Reflex to ID Panel     Status: None   Collection Time: 04/20/24  8:46 AM   Specimen: BLOOD RIGHT ARM  Result Value Ref Range Status   Specimen Description   Final    BLOOD RIGHT ARM Performed at [facility], [address]  [place], [place] [address]    Special Requests   Final    BOTTLES DRAWN AEROBIC AND ANAEROBIC Blood Culture results may not be optimal due to an inadequate volume of blood received in culture bottles Performed at [facility], [address], [place], [place] [address]    Culture   Final    NO GROWTH 5 DAYS Performed at [facility], [address] [place], [place] [address]    Report Status 04/25/2024 FINAL  Final  Culture, blood (Routine X 2) w Reflex to ID Panel     Status: None   Collection Time: 04/20/24  8:49 AM   Specimen: BLOOD  Result Value Ref Range Status   Specimen Description   Final    BLOOD Performed at [facility], [address], [place], [place] [address]    Special Requests   Final    BOTTLES DRAWN AEROBIC AND ANAEROBIC Blood Culture results may not be optimal due to an inadequate volume of blood received in culture bottles Performed at [facility], [name] [place], [place] [address]    Culture   Final    NO GROWTH 5 DAYS Performed at [facility], [address] [place], [place] [address]    Report Status 04/25/2024 FINAL  Final  Culture, Respiratory w Gram Stain     Status: None   Collection Time: 04/20/24 12:03 PM   Specimen: Tracheal Aspirate; Respiratory  Result Value Ref Range Status   Specimen Description   Final    TRACHEAL ASPIRATE Performed at [facility], [address], [place], [place] [address]    Special Requests   Final    NONE Performed at [facility], [address], [place], [place] [address]    Gram Stain NO WBC SEEN RARE GRAM POSITIVE COCCI   Final   Culture   Final    RARE Normal respiratory flora-no Staph aureus or Pseudomonas seen Performed at [facility], [address] [place], [place] [address]    Report Status 04/23/2024 FINAL  Final  Aerobic/Anaerobic Culture w Gram Stain (surgical/deep wound)     Status: None    Collection Time: 04/21/24  2:23 PM   Specimen: Soft Tissue, Other  Result Value Ref Range Status   Specimen Description   Final  TISSUE INTRA ABDOMINAL NECROTIC TISSUE Performed at [facility], [address] [place], [place] [address]    Special Requests   Final    NONE Performed at [facility], [address], [place], [place] [address]    Gram Stain   Final    FEW WBC PRESENT,BOTH PMN AND MONONUCLEAR RARE GRAM POSITIVE COCCI IN PAIRS AND CHAINS RARE YEAST    Culture   Final    RARE ESCHERICHIA COLI FEW CANDIDA ALBICANS FEW ENTEROCOCCUS FAECALIS FEW BACTEROIDES SPECIES BETA LACTAMASE POSITIVE Performed at [facility], [address] [place], [place] [address]    Report Status 04/25/2024 FINAL  Final   Organism ID, Bacteria ESCHERICHIA COLI  Final   Organism ID, Bacteria ENTEROCOCCUS FAECALIS  Final      Susceptibility   Escherichia coli - MIC*    AMPICILLIN >=32 RESISTANT Resistant     CEFEPIME  <=0.12 SENSITIVE Sensitive     CEFTAZIDIME <=1 SENSITIVE Sensitive     CEFTRIAXONE  1 SENSITIVE Sensitive     CIPROFLOXACIN  <=0.25 SENSITIVE Sensitive     GENTAMICIN  <=1 SENSITIVE Sensitive     IMIPENEM <=0.25 SENSITIVE Sensitive     TRIMETH /SULFA  <=20 SENSITIVE Sensitive     AMPICILLIN/SULBACTAM >=32 RESISTANT Resistant     PIP/TAZO 8 SENSITIVE Sensitive ug/mL    * RARE ESCHERICHIA COLI   Enterococcus faecalis - MIC*    AMPICILLIN <=2 SENSITIVE Sensitive     VANCOMYCIN  1 SENSITIVE Sensitive     GENTAMICIN  SYNERGY SENSITIVE Sensitive     * FEW ENTEROCOCCUS FAECALIS    Labs: Results for orders placed or performed during the hospital encounter of 04/10/24 (from the past 48 hours)  Glucose, capillary     Status: Abnormal   Collection Time: 05/11/24  7:53 AM  Result Value Ref Range   Glucose-Capillary 217 (H) 70 - 99 mg/dL    Comment: Glucose reference range applies only to samples taken after fasting for at least 8 hours.   Glucose, capillary     Status: Abnormal   Collection Time: 05/11/24 11:38 AM  Result Value Ref Range   Glucose-Capillary 129 (H) 70 - 99 mg/dL    Comment: Glucose reference range applies only to samples taken after fasting for at least 8 hours.  Glucose, capillary     Status: Abnormal   Collection Time: 05/11/24  3:53 PM  Result Value Ref Range   Glucose-Capillary 121 (H) 70 - 99 mg/dL    Comment: Glucose reference range applies only to samples taken after fasting for at least 8 hours.  Creatinine, fluid (pleural, peritoneal, JP Drainage)     Status: None   Collection Time: 05/11/24  4:30 PM  Result Value Ref Range   Creat, Fluid 17.8 mg/dL    Comment: (NOTE) No normal range established for this test Results should be evaluated in conjunction with serum values    Fluid Type-FCRE JP DRAINAGE     Comment: Performed at [facility], [address] [place], [place] [address]  Glucose, capillary     Status: Abnormal   Collection Time: 05/11/24  8:07 PM  Result Value Ref Range   Glucose-Capillary 155 (H) 70 - 99 mg/dL    Comment: Glucose reference range applies only to samples taken after fasting for at least 8 hours.  Glucose, capillary     Status: Abnormal   Collection Time: 05/11/24 11:01 PM  Result Value Ref Range   Glucose-Capillary 115 (H) 70 - 99 mg/dL  Comment: Glucose reference range applies only to samples taken after fasting for at least 8 hours.  Glucose, capillary     Status: Abnormal   Collection Time: 05/12/24  3:19 AM  Result Value Ref Range   Glucose-Capillary 194 (H) 70 - 99 mg/dL    Comment: Glucose reference range applies only to samples taken after fasting for at least 8 hours.  Heparin  level (unfractionated)     Status: None   Collection Time: 05/12/24  4:12 AM  Result Value Ref Range   Heparin  Unfractionated 0.40 0.30 - 0.70 IU/mL    Comment: (NOTE) The clinical reportable range upper limit is being lowered to >1.10 to align with the FDA approved  guidance for the current laboratory assay.  If heparin  results are below expected values, and patient dosage has  been confirmed, suggest follow up testing of antithrombin III levels. Performed at [facility], [address], [place], [place] [address]   Renal function panel     Status: Abnormal   Collection Time: 05/12/24  4:12 AM  Result Value Ref Range   Sodium 132 (L) 135 - 145 mmol/L   Potassium 4.3 3.5 - 5.1 mmol/L   Chloride 99 98 - 111 mmol/L   CO2 20 (L) 22 - 32 mmol/L   Glucose, Bld 157 (H) 70 - 99 mg/dL    Comment: Glucose reference range applies only to samples taken after fasting for at least 8 hours.   BUN 105 (H) 8 - 23 mg/dL   Creatinine, Ser 7.39 (H) 0.61 - 1.24 mg/dL    Comment: DELTA CHECK NOTED   Calcium  9.8 8.9 - 10.3 mg/dL   Phosphorus 6.9 (H) 2.5 - 4.6 mg/dL   Albumin  3.0 (L) 3.5 - 5.0 g/dL   GFR, Estimated 26 (L) >60 mL/min    Comment: (NOTE) Calculated using the CKD-EPI Creatinine Equation (2021)    Anion gap 13 5 - 15    Comment: Performed at [facility], [address] [place], [place] [address]  CBC     Status: Abnormal   Collection Time: 05/12/24  4:12 AM  Result Value Ref Range   WBC 9.3 4.0 - 10.5 K/uL   RBC 3.87 (L) 4.22 - 5.81 MIL/uL   Hemoglobin 11.4 (L) 13.0 - 17.0 g/dL   HCT 63.7 (L) 60.9 - 47.9 %   MCV 93.5 80.0 - 100.0 fL   MCH 29.5 26.0 - 34.0 pg   MCHC 31.5 30.0 - 36.0 g/dL   RDW 80.6 (H) 88.4 - 84.4 %   Platelets 147 (L) 150 - 400 K/uL   nRBC 0.0 0.0 - 0.2 %    Comment: Performed at [facility], [address], [place], [place] [address]  Glucose, capillary     Status: Abnormal   Collection Time: 05/12/24  7:55 AM  Result Value Ref Range   Glucose-Capillary 130 (H) 70 - 99 mg/dL    Comment: Glucose reference range applies only to samples taken after fasting for at least 8 hours.  Glucose, capillary     Status: Abnormal   Collection Time: 05/12/24 11:55 AM  Result Value Ref Range   Glucose-Capillary 141  (H) 70 - 99 mg/dL    Comment: Glucose reference range applies only to samples taken after fasting for at least 8 hours.  Glucose, capillary     Status: Abnormal   Collection Time: 05/12/24  3:16 PM  Result Value Ref Range   Glucose-Capillary 144 (H) 70 -  99 mg/dL    Comment: Glucose reference range applies only to samples taken after fasting for at least 8 hours.  Glucose, capillary     Status: Abnormal   Collection Time: 05/12/24  7:10 PM  Result Value Ref Range   Glucose-Capillary 101 (H) 70 - 99 mg/dL    Comment: Glucose reference range applies only to samples taken after fasting for at least 8 hours.  Glucose, capillary     Status: Abnormal   Collection Time: 05/12/24 11:02 PM  Result Value Ref Range   Glucose-Capillary 170 (H) 70 - 99 mg/dL    Comment: Glucose reference range applies only to samples taken after fasting for at least 8 hours.  Glucose, capillary     Status: Abnormal   Collection Time: 05/13/24  3:15 AM  Result Value Ref Range   Glucose-Capillary 203 (H) 70 - 99 mg/dL    Comment: Glucose reference range applies only to samples taken after fasting for at least 8 hours.  Renal function panel     Status: Abnormal   Collection Time: 05/13/24  5:00 AM  Result Value Ref Range   Sodium 131 (L) 135 - 145 mmol/L   Potassium 4.5 3.5 - 5.1 mmol/L   Chloride 101 98 - 111 mmol/L   CO2 16 (L) 22 - 32 mmol/L   Glucose, Bld 166 (H) 70 - 99 mg/dL    Comment: Glucose reference range applies only to samples taken after fasting for at least 8 hours.   BUN 107 (H) 8 - 23 mg/dL   Creatinine, Ser 7.73 (H) 0.61 - 1.24 mg/dL   Calcium  10.3 8.9 - 10.3 mg/dL   Phosphorus 5.8 (H) 2.5 - 4.6 mg/dL   Albumin  3.1 (L) 3.5 - 5.0 g/dL   GFR, Estimated 30 (L) >60 mL/min    Comment: (NOTE) Calculated using the CKD-EPI Creatinine Equation (2021)    Anion gap 14 5 - 15    Comment: Performed at [facility], [address] [place], [place] [address]  CBC     Status: Abnormal   Collection  Time: 05/13/24  5:00 AM  Result Value Ref Range   WBC 11.0 (H) 4.0 - 10.5 K/uL   RBC 4.04 (L) 4.22 - 5.81 MIL/uL   Hemoglobin 12.1 (L) 13.0 - 17.0 g/dL   HCT 62.5 (L) 60.9 - 47.9 %   MCV 92.6 80.0 - 100.0 fL   MCH 30.0 26.0 - 34.0 pg   MCHC 32.4 30.0 - 36.0 g/dL   RDW 80.7 (H) 88.4 - 84.4 %   Platelets 191 150 - 400 K/uL   nRBC 0.0 0.0 - 0.2 %    Comment: Performed at [facility], [address] [place], [place] [address]    Imaging / Studies: DG Loopogram Result Date: 05/12/2024 CLINICAL DATA:  [id number] Complication of Ileal conduit [facility]) [phone] Patient with ileal conduit, s/p recent nephrostogram with Dr. Luverne at time of nephrostomy exchange. Concern for conduit leak due to presence of urine in surgical drains. Team has requested introduction of contrast into the conduit to be followed by CT imaging. EXAM: WATER  SOLUBLE CONTRAST LOOPOGRAM TECHNIQUE: The urostomy bag was removed as his current device did not allow entry from port used to empty the collection bag. The skin was cleansed and a scout image was obtained. An 8 Fr Foley was inflated outside of the urostomy. The tip was inserted and the balloon used to gently occlude the stoma. Omni 300 was introduced through the  catheter. This technique was not adequate to prevent leakage. After discussion with Dr. Hughes, the 8 Fr Foley was introduced several cm into the stoma and balloon inflated with 5 mL saline within the stoma. Contrast was then introduced without leakage. Approximately 70 mL of water  soluble contrast ws introduce to the system; at that time, leakage around the stoma was noted and instillation was stopped. Fluoroscopy images were obtained throughout the procedure. The balloon was completely deflated and removed from the stoma. FLUOROSCOPY: Radiation Exposure Index and estimated peak skin dose (PSD); Reference air kerma (RAK), 6.5 mGy. Kerma-area product (KAP), 217.5 uGy*m. COMPARISON:  05/11/24 Nephrostogram FINDINGS: 1.  Contrast introduced via catheter is seen within the ileal conduit and retrograde opacification of the RIGHT renal collecting system. 2. The LEFT renal collecting system did not opacify. Patient to immediately transfer to CT imaging area for CT AP for further evaluation. IMPRESSION: 1. Contrast filling within the ileal conduit and retrograde opacification of the RIGHT renal collecting system. 2. The LEFT renal collecting system did not opacify. Electronically Signed   By: Thom Hughes M.D.   On: 05/12/2024 16:33   CT ABDOMEN PELVIS WO CONTRAST Result Date: 05/12/2024 CLINICAL DATA:  Status post prior cystectomy with ileal conduit formation. Recent surgery for bowel perforation. Suspected urine leak near the level ileal conduit due to surgical drain fluid return demonstrating elevated creatinine. EXAM: CT ABDOMEN AND PELVIS WITHOUT CONTRAST TECHNIQUE: Multidetector CT imaging of the abdomen and pelvis was performed following the standard protocol without IV contrast. Prior to the CT study, contrast was injected via a right lower quadrant urostomy into the ileal conduit RADIATION DOSE REDUCTION: This exam was performed according to the departmental dose-optimization program which includes automated exposure control, adjustment of the mA and/or kV according to patient size and/or use of iterative reconstruction technique. COMPARISON:  Recent CT studies on 05/03/2024 and 04/19/2024 FINDINGS: Lower chest: No acute abnormality. Hepatobiliary: No focal liver abnormality is seen. Status post cholecystectomy. No biliary dilatation. Pancreas: Unremarkable. No pancreatic ductal dilatation or surrounding inflammatory changes. Spleen: Normal in size without focal abnormality. Adrenals/Urinary Tract: Bilateral percutaneous nephrostomy tubes are present with pigtail portions formed in the renal pelvis bilaterally. There is some distension of the right renal collecting system with contrast due to free reflux of contrast injected  into the ileal conduit with contrast opacification of the entire right ureter and renal collecting system. 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 This stenosis was characterized by nephrostogram yesterday. Status post cystectomy. Stomach/Bowel: Ileal conduit is well distended with injected contrast. The urostomy itself appears focally stenotic just before the abdominal wall within the subcutaneous fat. There then is another segment of stenosis right at the abdominal wall and extending just into the peritoneal cavity where there may be a very subtle pinpoint leak extending laterally over approximately 2 cm and paralleling just superior to one of the surgical drains that originates in the fall left lateral abdomen and ultimately crosses over into the far right lateral lower abdomen. There is an additional more obvious leak along the inferior aspect of the conduit on axial images 75-83 and also seen well on coronal reconstructions beginning as a thin column of extravasated injected contrast measuring approximately 4-5 mm in width and extending into the right lower abdominal wall at the level of one of the transversely oriented surgical drains. No evidence of bowel obstruction, ileus  or free intraperitoneal air. Feeding tube extends into the mid stomach. Vascular/Lymphatic: No significant vascular findings are present. No enlarged abdominal or pelvic lymph nodes. Reproductive: No significant findings. Other: Edema of the right lower muscle wall and some ill-defined fluid in the right lower abdominal wall which appears stable since the prior CT. Musculoskeletal: No acute or significant osseous findings. IMPRESSION: 1. 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 There then is another segment of  stenosis right at the abdominal wall and extending just into the peritoneal cavity where there may be a very subtle pinpoint leak extending laterally over approximately 2 cm and paralleling just superior to one of the surgical drains that originates in the fall left lateral abdomen and ultimately crosses over into the far right lateral lower abdomen. 2. There is an additional more obvious leak along the inferior aspect of the conduit beginning as a thin column of extravasated injected contrast measuring approximately 4-5 mm in width and extending into the right lower abdominal wall at the level of one of the transversely oriented surgical drains. 3. Bilateral percutaneous nephrostomy tubes are present with pigtail portions formed in the renal pelvis bilaterally. There is some distension of the right renal collecting system with contrast due to free reflux of contrast injected into the ileal conduit with contrast opacification of the entire right ureter and renal collecting system. 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 This stenosis was characterized by nephrostogram yesterday. 4. Edema of the right lower muscle wall and some ill-defined fluid in the right lower abdominal wall which appears stable since the prior CT. Electronically Signed   By: Marcey Moan M.D.   On: 05/12/2024 14:32   IR NEPHROSTOMY EXCHANGE BILATERAL Result Date: 05/11/2024 INDICATION: Status post prior cystectomy with ileal conduit formation. Recent bowel surgery for bowel perforation. Concern for possible urine leak. EXAM: 1. LEFT PERCUTANEOUS NEPHROSTOMY TUBE EXCHANGE UNDER FLUOROSCOPY INCLUDING FULL ANTEGRADE NEPHROSTOGRAM 2. RIGHT PERCUTANEOUS NEPHROSTOMY TUBE EXCHANGE UNDER FLUOROSCOPY INCLUDING FULL ANTEGRADE NEPHROSTOGRAM COMPARISON:  None Available. MEDICATIONS: None ANESTHESIA/SEDATION: None CONTRAST:  50 mL Omnipaque   300-administered into the collecting system(s) FLUOROSCOPY: Radiation Exposure Index (as provided by the fluoroscopic device): 111 mGy Kerma COMPLICATIONS: None immediate. PROCEDURE: Informed written consent was obtained from the patient after a thorough discussion of the procedural risks, benefits and alternatives. All questions were addressed. Maximal Sterile Barrier Technique was utilized including caps, mask, sterile gowns, sterile gloves, sterile drape, hand hygiene and skin antiseptic. A timeout was performed prior to the initiation of the procedure. Both percutaneous nephrostomy tubes were injected with contrast material under fluoroscopy and multiple fluoroscopy loops and images were saved. The left percutaneous nephrostomy tube was then removed over a guidewire and exchanged for a 5 French catheter over a wire. The catheter was further advanced into the left ureter and additional nephrostogram performed to assess ureteral patency. A new 10 French percutaneous nephrostomy tube was then formed over a guidewire in the left renal pelvis A new 10 French right percutaneous nephrostomy tube was then exchanged over a guidewire and formed in the renal pelvis. Both nephrostomy tubes were secured at the skin with Prolene retention sutures, adhesive StatLock devices and attached to new gravity drainage bags. FINDINGS: Injection of the left percutaneous nephrostomy tube demonstrates retraction of the tube which is peripherally located  in a lower pole calyx. Contrast injection demonstrates left-sided hydronephrosis and poor drainage into the ileal conduit with suggestion of a distal ureteral stricture. This was confirmed by advancement of a 5 French catheter into the distal ureter with contrast injection demonstrating a high-grade stricture but flow of contrast through the stricture and into the ileal conduit once a catheter was advanced near the level of the stricture. There is no evidence of left-sided urine leak. The  left nephrostomy tube was replaced with a new 10 French catheter formed at the level of the renal pelvis. A right nephrostogram demonstrates retraction the right nephrostomy tube into a lower pole calyx. Contrast injection demonstrates good flow of contrast into the right ureter and ileal conduit without evidence of ureteral stricture or urine leak. The right nephrostomy tube was replaced with a new 10 French catheter formed at the level of the renal pelvis. IMPRESSION: 1. 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 Injection of contrast at the level of the distal ureter does show flow through the stricture and into the ileal conduit. No left-sided urine leak demonstrated. 2. Right nephrostogram demonstrates normally patent ureter and ureteral anastomosis with the ileal conduit. No right-sided urine leak demonstrated. 3. Both nephrostomy tubes had retracted into lower pole calices and were replaced with new 10 French catheters formed at the level of the renal pelvis bilaterally. Electronically Signed   By: Marcey Moan M.D.   On: 05/11/2024 12:35        Medications / Allergies: per chart  Antibiotics: Anti-infectives (From admission, onward)    Start     Dose/Rate Route Frequency Ordered Stop   05/06/24 1400  piperacillin -tazobactam (ZOSYN ) IVPB 3.375 g        3.375 g 12.5 mL/hr over 240 Minutes Intravenous Every 8 hours 05/06/24 1227     05/01/24 1400  piperacillin -tazobactam (ZOSYN ) IVPB 2.25 g  Status:  Discontinued        2.25 g 100 mL/hr over 30 Minutes Intravenous Every 8 hours 05/01/24 0957 05/06/24 1227   05/01/24 1045  micafungin  (MYCAMINE ) 100 mg in sodium chloride  0.9 % 100 mL IVPB        100 mg 105 mL/hr over 1 Hours Intravenous Every 24 hours 05/01/24 0949     04/29/24 1000  micafungin   (MYCAMINE ) 150 mg in sodium chloride  0.9 % 100 mL IVPB  Status:  Discontinued        150 mg 107.5 mL/hr over 1 Hours Intravenous Every 24 hours 04/28/24 1036 05/01/24 0949   04/28/24 1345  micafungin  (MYCAMINE ) 100 mg in sodium chloride  0.9 % 100 mL IVPB        100 mg 105 mL/hr over 1 Hours Intravenous  Once 04/28/24 1245 04/28/24 1354   04/28/24 1000  micafungin  (MYCAMINE ) 100 mg in sodium chloride  0.9 % 100 mL IVPB  Status:  Discontinued        100 mg 105 mL/hr over 1 Hours Intravenous Every 24 hours 04/27/24 1219 04/28/24 1036   04/21/24 1400  clindamycin  (CLEOCIN ) 900 mg, gentamicin  (GARAMYCIN ) 240 mg in sodium chloride  0.9 % 1,000 mL for intraperitoneal lavage  Status:  Discontinued         Irrigation To Surgery 04/21/24 1346 04/21/24 1618   04/20/24 1400  piperacillin -tazobactam (ZOSYN ) IVPB 3.375 g  Status:  Discontinued  3.375 g 12.5 mL/hr over 240 Minutes Intravenous Every 8 hours 04/20/24 0755 05/01/24 0957   04/20/24 1000  metroNIDAZOLE  (FLAGYL ) IVPB 500 mg  Status:  Discontinued        500 mg 100 mL/hr over 60 Minutes Intravenous Every 12 hours 04/20/24 0320 04/20/24 0752   04/20/24 1000  micafungin  (MYCAMINE ) 150 mg in sodium chloride  0.9 % 100 mL IVPB  Status:  Discontinued        150 mg 107.5 mL/hr over 1 Hours Intravenous Every 24 hours 04/20/24 0752 04/27/24 1219   04/20/24 0245  ceFEPIme  (MAXIPIME ) 2 g in sodium chloride  0.9 % 100 mL IVPB  Status:  Discontinued        2 g 200 mL/hr over 30 Minutes Intravenous Every 8 hours 04/20/24 0232 04/20/24 0752   04/20/24 0100  metroNIDAZOLE  (FLAGYL ) IVPB 500 mg        500 mg 100 mL/hr over 60 Minutes Intravenous On call to O.R. 04/20/24 0004 04/20/24 0100   04/14/24 1800  ceFEPIme  (MAXIPIME ) 2 g in sodium chloride  0.9 % 100 mL IVPB        2 g 200 mL/hr over 30 Minutes Intravenous Every 8 hours 04/14/24 1003 04/19/24 0957   04/14/24 1600  vancomycin  (VANCOCIN ) IVPB 1000 mg/200 mL premix  Status:  Discontinued        1,000  mg 200 mL/hr over 60 Minutes Intravenous Every 24 hours 04/13/24 1420 04/14/24 1000   04/14/24 1600  vancomycin  (VANCOREADY) IVPB 1500 mg/300 mL  Status:  Discontinued        1,500 mg 150 mL/hr over 120 Minutes Intravenous Every 24 hours 04/14/24 1002 04/16/24 0744   04/14/24 1200  vancomycin  (VANCOCIN ) IVPB 1000 mg/200 mL premix  Status:  Discontinued        1,000 mg 200 mL/hr over 60 Minutes Intravenous Every 24 hours 04/13/24 1053 04/13/24 1109   04/13/24 2200  ceFEPIme  (MAXIPIME ) 2 g in sodium chloride  0.9 % 100 mL IVPB  Status:  Discontinued        2 g 200 mL/hr over 30 Minutes Intravenous Every 12 hours 04/13/24 1036 04/13/24 1109   04/13/24 2200  ceFEPIme  (MAXIPIME ) 2 g in sodium chloride  0.9 % 100 mL IVPB  Status:  Discontinued        2 g 200 mL/hr over 30 Minutes Intravenous Every 12 hours 04/13/24 1425 04/14/24 1003   04/13/24 1500  vancomycin  (VANCOCIN ) 2,000 mg in sodium chloride  0.9 % 500 mL IVPB        2,000 mg 260 mL/hr over 120 Minutes Intravenous  Once 04/13/24 1409 04/13/24 1850   04/13/24 1500  ceFEPIme  (MAXIPIME ) 2 g in sodium chloride  0.9 % 100 mL IVPB  Status:  Discontinued        2 g 200 mL/hr over 30 Minutes Intravenous Every 12 hours 04/13/24 1420 04/13/24 1425   04/13/24 1500  metroNIDAZOLE  (FLAGYL ) IVPB 500 mg  Status:  Discontinued        500 mg 100 mL/hr over 60 Minutes Intravenous Every 12 hours 04/13/24 1420 04/17/24 0725   04/13/24 1200  vancomycin  (VANCOCIN ) 2,000 mg in sodium chloride  0.9 % 500 mL IVPB  Status:  Discontinued        2,000 mg 260 mL/hr over 120 Minutes Intravenous  Once 04/13/24 1052 04/13/24 1121   04/13/24 1100  metroNIDAZOLE  (FLAGYL ) IVPB 500 mg  Status:  Discontinued        500 mg 100 mL/hr over 60 Minutes Intravenous 2 times daily  04/13/24 1007 04/13/24 1109   04/13/24 1100  vancomycin  (VANCOCIN ) IVPB 1000 mg/200 mL premix  Status:  Discontinued        1,000 mg 200 mL/hr over 60 Minutes Intravenous  Once 04/13/24 1007 04/13/24 1020    04/13/24 1015  ceFEPIme  (MAXIPIME ) 2 g in sodium chloride  0.9 % 100 mL IVPB        2 g 200 mL/hr over 30 Minutes Intravenous STAT 04/13/24 1007 04/14/24 1721   04/10/24 2200  ceFAZolin  (ANCEF ) IVPB 2g/100 mL premix        2 g 200 mL/hr over 30 Minutes Intravenous Every 8 hours 04/10/24 1833 04/11/24 0531   04/10/24 0600  ceFAZolin  (ANCEF ) IVPB 2g/100 mL premix        2 g 200 mL/hr over 30 Minutes Intravenous On call to O.R. 04/10/24 0533 04/10/24 1526         Note: Portions of this report may have been transcribed using voice recognition software. Every effort was made to ensure accuracy; however, inadvertent computerized transcription errors may be present.   Any transcriptional errors that result from this process are unintentional.    Elspeth KYM Schultze, MD, FACS, MASCRS Esophageal, Gastrointestinal & Colorectal Surgery Robotic and Minimally Invasive Surgery  [facility] [address], [address] [place], [place] [address] [phone] Fax [phone] Main  CONTACT INFORMATION: Weekday (9AM-5PM): Call [facility] main office at [phone] Weeknight (5PM-9AM) or Weekend/Holiday: Check EPIC Web Links tab & use AMION (password  [email or web address]) for General Surgery [facility] coverage  Please, DO NOT use SecureChat  (it is not reliable communication to reach operating surgeons & will lead to a delay in care).   Epic staff messaging available for outptient concerns needing 1-2 business day response.      05/13/2024  7:45 AM

## 2024-05-13 NOTE — Progress Notes (Signed)
 Physical Therapy Treatment Patient Details Name: Kristopher Thompson MRN: [id number] DOB: 06/07/1951 Today's Date: 05/13/2024   History of Present Illness 73 y/o M admitted to [facility] 5/30 for hernia repair and urostomy revision. 6/9 to OR for perforated bowel, post op shock, s/p exploratory lapartomy with drainage of Rt abdominal wall abscess. CRRT 6/11-6/14. Extubated 6/14, Transferred to [facility] 6/15 for intermittent HD. CRRT again 6/17-6/20. 6/23 bil perc nephrostomy tubes placed. PMHx: bladder and prostate CA, RBBB, CHF, colostomy, DM, GERD, abdominal surgery.    PT Comments  Patient initially refusing to work with therapy. Reports he gets too dizzy and heart races with activity. Educated on risks of inactivity and ultimately agreed to get OOB. Requires incr time for all mobility with rest breaks for dizziness to subside. Once in chair, pt reported too fatigued to do anything more. Requested return to bed in <1 hour due to his backside begins to hurt from sitting. Pt sitting on pillow to incr comfort. RN made aware of pt's concerns and recommendation to use stedy for chair to bed.     If plan is discharge home, recommend the following: Assistance with cooking/housework;Assist for transportation;Help with stairs or ramp for entrance;Supervision due to cognitive status;A lot of help with walking and/or transfers;A lot of help with bathing/dressing/bathroom   Can travel by private vehicle     No  Equipment Recommendations  Rolling walker (2 wheels);BSC/3in1    Recommendations for Other Services       Precautions / Restrictions Precautions Precautions: Fall;Other (comment) Recall of Precautions/Restrictions: Impaired Precaution/Restrictions Comments: bil JP drains, ileostomy, urostomy, abd wound vac, bil perc nephrostomy drains Restrictions Weight Bearing Restrictions Per Provider Order: No     Mobility  Bed Mobility Overal bed mobility: Needs Assistance Bed Mobility: Rolling, Sidelying to  Sit Rolling: Mod assist Sidelying to sit: Mod assist       General bed mobility comments: assist for trunk roll and power up into sitting, scoot to EOB without assist with incr time. Pt endorsing dizziness upon first sitting, but improved with continued sitting and vss    Transfers Overall transfer level: Needs assistance Equipment used: Rolling walker (2 wheels) Transfers: Sit to/from Stand, Bed to chair/wheelchair/BSC Sit to Stand: Mod assist, +2 physical assistance, From elevated surface   Step pivot transfers: Mod assist, +2 physical assistance       General transfer comment: use of momentum to come to stand; pt very concerned he will not have strength to get back to bed and educated him and RN on using stedy    Ambulation/Gait               General Gait Details: pt reported too dizzy; HR 127 with standing   Stairs             Wheelchair Mobility     Tilt Bed    Modified Rankin (Stroke Patients Only)       Balance Overall balance assessment: Needs assistance Sitting-balance support: No upper extremity supported, Feet supported Sitting balance-Leahy Scale: Fair Sitting balance - Comments: able to static sit EOB without UB support   Standing balance support: Reliant on assistive device for balance Standing balance-Leahy Scale: Poor Standing balance comment: RW                            Communication Communication Communication: Impaired Factors Affecting Communication: Hearing impaired  Cognition Arousal: Alert Behavior During Therapy: Flat affect   PT - Cognitive  impairments: Safety/Judgement, Initiation                       PT - Cognition Comments: slow processing, complicated by [facility] Following commands: Impaired Following commands impaired: Follows one step commands with increased time    Cueing Cueing Techniques: Verbal cues, Gestural cues, Tactile cues  Exercises General Exercises - Lower Extremity Ankle  Circles/Pumps: Both, AROM, 10 reps, Supine    General Comments        Pertinent Vitals/Pain Pain Assessment Pain Assessment: Faces Faces Pain Scale: Hurts a little bit Pain Location: abd Pain Descriptors / Indicators: Sore, Tender Pain Intervention(s): Limited activity within patient's tolerance, Monitored during session    Home Living                          Prior Function            PT Goals (current goals can now be found in the care plan section) Acute Rehab PT Goals Patient Stated Goal: Regain independence PT Goal Formulation: With patient Time For Goal Achievement: 05/27/24 Potential to Achieve Goals: Good Progress towards PT goals: Not progressing toward goals - comment;Goals updated (dizziness, incr HR, fatigue)    Frequency    Min 2X/week      PT Plan      Co-evaluation              AM-PAC PT 6 Clicks Mobility   Outcome Measure  Help needed turning from your back to your side while in a flat bed without using bedrails?: A Lot Help needed moving from lying on your back to sitting on the side of a flat bed without using bedrails?: A Lot Help needed moving to and from a bed to a chair (including a wheelchair)?: A Lot Help needed standing up from a chair using your arms (e.g., wheelchair or bedside chair)?: A Lot Help needed to walk in hospital room?: Total Help needed climbing 3-5 steps with a railing? : Total 6 Click Score: 10    End of Session Equipment Utilized During Treatment: Gait belt Activity Tolerance: Patient limited by fatigue Patient left: with family/visitor present;in chair;with chair alarm set;with call bell/phone within reach Nurse Communication: Mobility status;Need for lift equipment (use stedy for return to bed) PT Visit Diagnosis: Unsteadiness on feet (R26.81);Other abnormalities of gait and mobility (R26.89);Muscle weakness (generalized) (M62.81);Difficulty in walking, not elsewhere classified (R26.2) Pain -  Right/Left:  (abdomen)     Time: 9143-9071 PT Time Calculation (min) (ACUTE ONLY): 32 min  Charges:    $Therapeutic Activity: 23-37 mins PT General Charges $$ ACUTE PT VISIT: 1 Visit                      Macario RAMAN, PT Acute Rehabilitation Services  Office [phone]    Macario SHAUNNA Soja 05/13/2024, 9:40 AM

## 2024-05-13 NOTE — Progress Notes (Signed)
 PROGRESS NOTE    [name]  FMW:[id number] DOB: Oct 18, 1951 DOA: 04/10/2024 PCP: [name], MD    Brief Narrative:   Kristopher Thompson is a 73 y.o. male with past medical history significant for HTN, chronic diastolic congestive heart failure, CAD, DM2, pulmonary embolism on Eliquis, anemia, obesity, prostate cancer, bladder cancer who was initially admitted by the general surgery service on 5/30 for operative management regarding parastomal and incisional incarcerated abdominal wall hernias; requiring extensive lysis, mesh insertion, repair, urostomy ileal conduit revision thereafter admitted to the ICU due to tachycardia and tachypnea. Initially placed on norepinephrine  and an arterial line was placed. Patient declined NG tube placement but eventually agreed. Patient was taken back to the OR on 6/9 due to perforated bowel and postop shock. Required multiple pressors, steroids and PRBC transfusion after washout/closure in the OR. On 6/11 developed AKI requiring dialysis.. Patient was extubated on 6/14.   Significant hospital events/procedures: 5/30: Admit to general surgery, robotic LOA, incisional/parastomal, left inguinal incarcerated hernia repair with mesh, urostomy ileal conduit revision, Dr. Sheldon 6/2: CCM consult and vasopressor 6/4: off NE. Pulled out his NGT refused replacement. After multiple emesis  6/5: NG tube replaced. 6/6: transferred to MedSurg. 6/9: OR for perforated bowel, drainage from right abdominal wall abscess, intra-abdominal abscess, small bowel resection, wound VAC; Dr. Tanda 6/10: NE, Phenylephrine , vasopressin , steroids, 1u PRBCs, back to OR for LOA, abdominal wall debridement, ileal resection, end ileostomy abdominal wall hernia repairs with mesh, fascial closure, wound VAC, Dr. Sheldon 6/11: Worsening AKI with fluid overload, start CRRT, decreasing pressor requirements 6/14: Off CRRT, arousable.  Extubated. 6/15: transferred to [facility] for iHD; montoring  for renal recovery 6/16: iHD, severely encephalopathic with high BUN 6/17: restarted CRRT for to ongoing uremia and encephalopathy 6/18: was awake enough to work with PT, OT 6/23: bilateral PCN placed by interventional radiology, Dr. Jennefer 6/24: MS imrpoving, Neph tubes in and draining 6/30: bilateral nephrostomy tube exchange by IR 7/2: Right nephroureteral exchange with possible stenting per IR, cortrak removed today  Assessment & Plan:    Incisional/parastomal, left inguinal incarcerated hernia s/p robotic hernia repair Septic shock secondary to acute peritonitis from bowel perforation Patient presenting for surgical repair of incisional/parastomal and left inguinal incarcerated hernia and underwent robotic LOA with mesh placement and urostomy ileal conduit revision by Dr. Sheldon on 04/10/2024.  Postoperatively patient required vasopressors and was transferred to the ICU.  Subsequent return to the OR on 04/20/2024 for bowel perforation underwent I&D of abdominal wall/intra-abdominal abscess with small bowel resection.  Returned to OR on 6/10 for further abdominal wall debridement, LOA, ileal resection, end ileostomy with abdominal wall hernia repairs with mesh, fascial closure with wound VAC.  Seen by infectious disease, Dr. Dea; with recommendation of 4-6-week treatment given mesh placed in a contaminated field with EOT 05/18/2024. -- Surgery following, appreciate assistance -- Zosyn  3.375 g IV every 8 hours -- Micafungin  100 mg IV every 24 hours -- Per infectious disease, EOT 05/18/2024 -- Wound VAC changes twice weekly Monday/Thursday -- Further per general surgery  Acute renal failure secondary to ischemic ATN/shock Bilateral hydronephrosis/obstruction -- Nephrology, urology following, appreciate assistance -- Cr (0.97 04/01/2024), 2.08>>0.84>>3.70>>1.75>3.30>>2.61>2.60>2.26 -- Required CRRT followed by HD while inpatient, last HD 6/23 -- S/p bilateral PCN on 6/23, exchanged  6/30  Urine leak w/ Hx prior radical cystectomy/ileal conduit urinary diversion CT loopogram 7/1 with finding of urine leak identified at inferior butt end of ileal conduit. -- Urology following, appreciate assistance -- Right nephroureteral exchange  with possible stenting per IR today, n.p.o.  Type 2 diabetes mellitus On metformin 500 mg p.o. daily outpatient.  Hemoglobin A1c 5.3 on 04/01/2024. -- Semglee  25u [place] at bedtime -- Resistant SSI for coverage  Acute blood loss anemia Right flank hematoma Anemia of chronic medical disease Anemia panel with iron  54, TIBC 281, ferritin 588 -- Hgb 12.5>>7.5>>9.9>>10.9>11.4>12.1, stable  Chronic diastolic congestive heart failure HTN -- Hydralazine  20 mg IV every 4 hours as needed SBP greater than 160  HLD -- Crestor  10 mg p.o. nightly  History of pulmonary embolism On Eliquis at baseline. -- Heparin  drip  Moderate protein calorie malnutrition Obesity, class I Body mass index is 30.53 kg/m. Nutrition Status: Nutrition Problem: Increased nutrient needs Etiology: acute illness Signs/Symptoms: estimated needs Interventions: Tube feeding, Prostat, Juven  Weakness/ability/deconditioning: -- PT/OT currently recommends CIR -- Continue therapy efforts: Patient   DVT prophylaxis: Place TED hose Start: 04/28/24 0818 SCD's Start: 04/10/24 1834    Code Status: Full Code Family Communication: Updated spouse present at bedside this morning  Disposition Plan:  Level of care: Med-Surg Status is: Inpatient Remains inpatient appropriate because: Antibiotics, further surgical intervention/procedures    Consultants:  General Surgery Interventional radiology Urology Nephrology PCCM Infectious disease, Dr. Dea  Procedures:  robotic LOA, incisional/parastomal, left inguinal incarcerated hernia repair with mesh, urostomy ileal conduit revision, Dr. Sheldon, 5/30 drainage from right abdominal wall abscess, intra-abdominal abscess,  small bowel resection, wound VAC; Dr. Tanda, 6/9 LOA, abdominal wall debridement, ileal resection, end ileostomy, abdominal wall hernia repairs with mesh, wound VAC placement, Dr. Sheldon; 6/10 bilateral PCN placed by interventional radiology, Dr. Jennefer, 6/23 bilateral nephrostomy tube exchange by IR, 6/30 Right nephroureteral exchange with possible stenting: Pending per IR  Antimicrobials:  Vancomycin  6/2 - 6/4 Metronidazole  6/2 - 6/5, 6/8 - 6/8 Cefepime  6/2 - 6/8 Zosyn  6/9> Micafungin  6/9>>   Subjective: Patient seen examined bedside, lying in bed.  Spouse present.  No specific complaints this morning.  Seen by urology, Dr. Renda and general surgery, Dr. Sheldon.  Cortrak removed.  Pending IR intervention for urine leak.  No other issues or complaints, questions or concerns at this time.  Denies headache, no dizziness, no chest pain, no palpitations, no shortness of breath, no fever/chills/night sweats, no nausea/vomiting, no focal weakness, no fatigue, no paresthesia.  No acute events overnight per nurse staff.  Objective: Vitals:   05/12/24 1910 05/12/24 2305 05/12/24 2306 05/13/24 0301  BP: 113/81 123/81  115/83  Pulse: (!) 108 (!) 111 (!) 109 (!) 107  Resp: 16 16  17   Temp: 98 F (36.7 C) 97.7 F (36.5 C)  97.6 F (36.4 C)  TempSrc: Axillary Axillary  Axillary  SpO2: 98% 98% 99% 99%  Weight:      Height:        Intake/Output Summary (Last 24 hours) at 05/13/2024 1402 Last data filed at 05/13/2024 1120 Gross per 24 hour  Intake --  Output 3210 ml  Net -3210 ml   Filed Weights   05/04/24 2200 05/06/24 0500 05/07/24 0500  Weight: 44.9 kg 100.4 kg 99.3 kg    Examination:  Physical Exam: GEN: NAD, alert and oriented x 3, ill in appearance HEENT: NCAT, PERRL, EOMI, sclera clear, MMM PULM: CTAB w/o wheezes/crackles, normal respiratory effort, on room air CV: RRR w/o M/G/R GI: abd soft, mild TTP surrounding wound VAC site, ostomy noted with stool in collection bag;  urostomy noted with urine in collection bag, bilateral PCNs noted with clear urine. MSK: no peripheral edema,  muscle strength globally intact 5/5 bilateral upper/lower extremities NEURO: CN II-XII intact, no focal deficits, sensation to light touch intact PSYCH: normal mood/affect Integumentary: Abdominal wound VAC site noted/urostomy, otherwise no other concerning rashes/lesions/wounds noted on component skin service.    Data Reviewed: I have personally reviewed following labs and imaging studies  CBC: Recent Labs  Lab 05/09/24 1351 05/10/24 0600 05/11/24 0500 05/12/24 0412 05/13/24 0500  WBC 11.5* 11.3* 9.4 9.3 11.0*  HGB 10.2* 10.5* 10.9* 11.4* 12.1*  HCT 31.5* 32.8* 34.2* 36.2* 37.4*  MCV 91.3 94.3 92.4 93.5 92.6  PLT 124* 131* 145* 147* 191   Basic Metabolic Panel: Recent Labs  Lab 05/07/24 0415 05/08/24 0901 05/09/24 0830 05/10/24 0600 05/11/24 0000 05/11/24 0500 05/12/24 0412 05/13/24 0500  NA 135   < > 132* 132* 133* 132* 132* 131*  K 4.2   < > 4.6 4.8 4.4 4.9 4.3 4.5  CL 103   < > 98 100 100 99 99 101  CO2 21*   < > 20* 18* 20* 19* 20* 16*  GLUCOSE 143*   < > 139* 100* 147* 183* 157* 166*  BUN 74*   < > 89* 89* 96* 95* 105* 107*  CREATININE 1.94*   < > 2.84* 2.29* 2.52* 2.61* 2.60* 2.26*  CALCIUM  9.0   < > 9.2 9.7 9.9 9.8 9.8 10.3  MG 1.9  --   --   --   --   --   --  2.3  PHOS 3.4  --  4.7* 4.7*  --  1.2* 6.9* 5.8*  5.8*   < > = values in this interval not displayed.   GFR: Estimated Creatinine Clearance: 36 mL/min (A) (by C-G formula based on SCr of 2.26 mg/dL (H)). Liver Function Tests: Recent Labs  Lab 05/10/24 0600 05/11/24 0000 05/11/24 0500 05/12/24 0412 05/13/24 0500  AST  --  34  --   --   --   ALT  --  58*  --   --   --   ALKPHOS  --  93  --   --   --   BILITOT  --  0.7  --   --   --   PROT  --  7.0  --   --   --   ALBUMIN  2.9* 3.0* 3.0* 3.0* 3.1*   No results for input(s): LIPASE, AMYLASE in the last 168 hours. No results for  input(s): AMMONIA in the last 168 hours. Coagulation Profile: No results for input(s): INR, PROTIME in the last 168 hours. Cardiac Enzymes: No results for input(s): CKTOTAL, CKMB, CKMBINDEX, TROPONINI in the last 168 hours. BNP (last 3 results) No results for input(s): PROBNP in the last 8760 hours. HbA1C: No results for input(s): HGBA1C in the last 72 hours. CBG: Recent Labs  Lab 05/12/24 1910 05/12/24 2302 05/13/24 0315 05/13/24 0758 05/13/24 1126  GLUCAP 101* 170* 203* 103* 144*   Lipid Profile: No results for input(s): CHOL, HDL, LDLCALC, TRIG, CHOLHDL, LDLDIRECT in the last 72 hours. Thyroid  Function Tests: No results for input(s): TSH, T4TOTAL, FREET4, T3FREE, THYROIDAB in the last 72 hours. Anemia Panel: No results for input(s): VITAMINB12, FOLATE, FERRITIN, TIBC, IRON , RETICCTPCT in the last 72 hours. Sepsis Labs: No results for input(s): PROCALCITON, LATICACIDVEN in the last 168 hours.  No results found for this or any previous visit (from the past 240 hours).       Radiology Studies: DG Loopogram Result Date: 05/12/2024 CLINICAL DATA:  [id number] Complication of Ileal  conduit [facility]) [id number] Patient with ileal conduit, s/p recent nephrostogram with Dr. Luverne at time of nephrostomy exchange. Concern for conduit leak due to presence of urine in surgical drains. Team has requested introduction of contrast into the conduit to be followed by CT imaging. EXAM: WATER  SOLUBLE CONTRAST LOOPOGRAM TECHNIQUE: The urostomy bag was removed as his current device did not allow entry from port used to empty the collection bag. The skin was cleansed and a scout image was obtained. An 8 Fr Foley was inflated outside of the urostomy. The tip was inserted and the balloon used to gently occlude the stoma. Omni 300 was introduced through the catheter. This technique was not adequate to prevent leakage. After discussion with Dr. Hughes, the 8  Fr Foley was introduced several cm into the stoma and balloon inflated with 5 mL saline within the stoma. Contrast was then introduced without leakage. Approximately 70 mL of water  soluble contrast ws introduce to the system; at that time, leakage around the stoma was noted and instillation was stopped. Fluoroscopy images were obtained throughout the procedure. The balloon was completely deflated and removed from the stoma. FLUOROSCOPY: Radiation Exposure Index and estimated peak skin dose (PSD); Reference air kerma (RAK), 6.5 mGy. Kerma-area product (KAP), 217.5 uGy*m. COMPARISON:  05/11/24 Nephrostogram FINDINGS: 1. Contrast introduced via catheter is seen within the ileal conduit and retrograde opacification of the RIGHT renal collecting system. 2. The LEFT renal collecting system did not opacify. Patient to immediately transfer to CT imaging area for CT AP for further evaluation. IMPRESSION: 1. Contrast filling within the ileal conduit and retrograde opacification of the RIGHT renal collecting system. 2. The LEFT renal collecting system did not opacify. Electronically Signed   By: Thom Hughes M.D.   On: 05/12/2024 16:33   CT ABDOMEN PELVIS WO CONTRAST Result Date: 05/12/2024 CLINICAL DATA:  Status post prior cystectomy with ileal conduit formation. Recent surgery for bowel perforation. Suspected urine leak near the level ileal conduit due to surgical drain fluid return demonstrating elevated creatinine. EXAM: CT ABDOMEN AND PELVIS WITHOUT CONTRAST TECHNIQUE: Multidetector CT imaging of the abdomen and pelvis was performed following the standard protocol without IV contrast. Prior to the CT study, contrast was injected via a right lower quadrant urostomy into the ileal conduit RADIATION DOSE REDUCTION: This exam was performed according to the departmental dose-optimization program which includes automated exposure control, adjustment of the mA and/or kV according to patient size and/or use of iterative  reconstruction technique. COMPARISON:  Recent CT studies on 05/03/2024 and 04/19/2024 FINDINGS: Lower chest: No acute abnormality. Hepatobiliary: No focal liver abnormality is seen. Status post cholecystectomy. No biliary dilatation. Pancreas: Unremarkable. No pancreatic ductal dilatation or surrounding inflammatory changes. Spleen: Normal in size without focal abnormality. Adrenals/Urinary Tract: Bilateral percutaneous nephrostomy tubes are present with pigtail portions formed in the renal pelvis bilaterally. There is some distension of the right renal collecting system with contrast due to free reflux of contrast injected into the ileal conduit with contrast opacification of the entire right ureter and renal collecting system. 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 This stenosis was characterized by nephrostogram yesterday. Status post cystectomy. Stomach/Bowel: Ileal conduit is well distended with injected contrast. The urostomy itself appears focally stenotic just before the abdominal wall within the subcutaneous fat. There then is another segment of stenosis right at the abdominal wall and extending just into the  peritoneal cavity where there may be a very subtle pinpoint leak extending laterally over approximately 2 cm and paralleling just superior to one of the surgical drains that originates in the fall left lateral abdomen and ultimately crosses over into the far right lateral lower abdomen. There is an additional more obvious leak along the inferior aspect of the conduit on axial images 75-83 and also seen well on coronal reconstructions beginning as a thin column of extravasated injected contrast measuring approximately 4-5 mm in width and extending into the right lower abdominal wall at the level of one of the transversely oriented surgical drains. No evidence of bowel obstruction, ileus or  free intraperitoneal air. Feeding tube extends into the mid stomach. Vascular/Lymphatic: No significant vascular findings are present. No enlarged abdominal or pelvic lymph nodes. Reproductive: No significant findings. Other: Edema of the right lower muscle wall and some ill-defined fluid in the right lower abdominal wall which appears stable since the prior CT. Musculoskeletal: No acute or significant osseous findings. IMPRESSION: 1. 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 There then is another segment of stenosis right at the abdominal wall and extending just into the peritoneal cavity where there may be a very subtle pinpoint leak extending laterally over approximately 2 cm and paralleling just superior to one of the surgical drains that originates in the fall left lateral abdomen and ultimately crosses over into the far right lateral lower abdomen. 2. There is an additional more obvious leak along the inferior aspect of the conduit beginning as a thin column of extravasated injected contrast measuring approximately 4-5 mm in width and extending into the right lower abdominal wall at the level of one of the transversely oriented surgical drains. 3. Bilateral percutaneous nephrostomy tubes are present with pigtail portions formed in the renal pelvis bilaterally. There is some distension of the right renal collecting system with contrast due to free reflux of contrast injected into the ileal conduit with contrast opacification of the entire right ureter and renal collecting system. 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 This stenosis was characterized by nephrostogram yesterday. 4. Edema of the right lower muscle wall and some ill-defined fluid in the right lower abdominal wall which  appears stable since the prior CT. Electronically Signed   By: Marcey Moan M.D.   On: 05/12/2024 14:32        Scheduled Meds:  Chlorhexidine  Gluconate Cloth  6 each Topical Daily   vitamin B-12  1,000 mcg Oral Daily   darbepoetin (ARANESP ) injection - DIALYSIS  200 mcg Subcutaneous Q Fri-1800   feeding supplement  237 mL Oral BID BM   feeding supplement (PROSource TF20)  60 mL Per Tube BID   insulin  aspart  0-20 Units Subcutaneous Q4H   insulin  glargine-yfgn  25 Units Subcutaneous QHS   melatonin  5 mg Oral QHS   metoCLOPramide   5 mg Oral TID AC & HS   multivitamin with minerals  1 tablet Oral Daily   nutrition supplement (JUVEN)  1 packet Oral BID BM   mouth rinse  15 mL Mouth Rinse 4 times per day   polycarbophil  625 mg Oral BID   prochlorperazine   10 mg Intravenous Once   rosuvastatin   10 mg Oral QPM   sevelamer  carbonate  800 mg Oral TID  WC   sodium chloride  flush  5 mL Intracatheter Q8H   Continuous Infusions:  sodium chloride  Stopped (05/01/24 0014)   albumin  human     anticoagulant sodium citrate      micafungin  (MYCAMINE ) 100 mg in sodium chloride  0.9 % 100 mL IVPB 100 mg (05/13/24 1124)   piperacillin -tazobactam (ZOSYN )  IV 3.375 g (05/13/24 1339)     LOS: 33 days    Time spent: 56 minutes spent on 05/13/2024 caring for this patient face-to-face including chart review, ordering labs/tests, documenting, discussion with nursing staff, consultants, updating family and interview/physical exam    Camellia PARAS [place], DO [facility] Available via Epic secure chat 7am-7pm After these hours, please refer to coverage provider listed on [email or web address] 05/13/2024, 2:02 PM

## 2024-05-13 NOTE — Progress Notes (Signed)
   05/13/24 1706  Vitals  Temp 97.7 F (36.5 C)  Temp Source Axillary  BP 112/80  MAP (mmHg) 91  BP Location Left Arm  BP Method Automatic  Patient Position (if appropriate) Lying  Pulse Rate (!) 111  Pulse Rate Source Monitor  Resp 18  Level of Consciousness  Level of Consciousness Alert  MEWS COLOR  MEWS Score Color Yellow  Oxygen Therapy  SpO2 97 %  O2 Device Room Air  Patient Activity (if Appropriate) In bed  Pulse Oximetry Type Continuous  MEWS Score  MEWS Temp 0  MEWS Systolic 0  MEWS Pulse 2  MEWS RR 0  MEWS LOC 0  MEWS Score 2   Pt return to unit from IR. Pt alert and verbally responsive, MAEx4. Visible blue stent in urostomy bag.

## 2024-05-13 NOTE — Progress Notes (Addendum)
 PHARMACY - ANTICOAGULATION CONSULT NOTE  Pharmacy Consult for Heparin  Indication: h/o PE  Allergies  Allergen Reactions   Demerol  [Meperidine ] Nausea And Vomiting and Other (See Comments)    SEVERE NAUSEA    Patient Measurements: Height: 5' 11 (180.3 cm) Weight: 99.3 kg (218 lb 14.7 oz) IBW/kg (Calculated) : 75.3 HEPARIN  DW (KG): 100.8  Vital Signs: Temp: 97.6 F (36.4 C) (07/02 0301) Temp Source: Axillary (07/02 0301) BP: 115/83 (07/02 0301) Pulse Rate: 107 (07/02 0301)  Labs: Recent Labs    05/11/24 0500 05/12/24 0412 05/13/24 0500  HGB 10.9* 11.4* 12.1*  HCT 34.2* 36.2* 37.4*  PLT 145* 147* 191  HEPARINUNFRC 0.27* 0.40 0.53  CREATININE 2.61* 2.60* 2.26*    Estimated Creatinine Clearance: 36 mL/min (A) (by C-G formula based on SCr of 2.26 mg/dL (H)).  Assessment:  Patient with prolonged hospitalization including post-op complications resulting in bowel perforation with open abdomen. Has been closed and transferred to [facility]. On Eliquis PTA for hx of PE. Has been on heparin  gtt since 6/6.   Heparin  level remains therapeutic. No bleeding noted. CBC stable.  Goal of Therapy:  Heparin  level 0.3-0.7 units/ml Monitor platelets by anticoagulation protocol: Yes   Plan:  Continue heparin  gtt at 1900 units/hr  Daily heparin  level and CBC daily Monitor for s/sx of bleeding Continue to hold DOAC because patient might still need a G-tube  Vito Ralph, PharmD, BCPS Please see amion for complete clinical pharmacist phone list 05/13/2024, 8:11 AM  ADDENDUM (1030): Holding heparin  at 1200 for IR procedure(to be completed after 2pm) - R nephroureteral exchange with possible stenting.   Plan: D/w Glennon Bal, PA and okay to restart heparin  ~3 hours post procedure. Will f/u procedure end time and ability to restart heparin   Vito Ralph, PharmD, BCPS Please see amion for complete clinical pharmacist phone list 05/13/2024 10:32 AM

## 2024-05-13 NOTE — Progress Notes (Signed)
 Occupational Therapy Treatment Patient Details Name: Kristopher Thompson MRN: [id number] DOB: [birth year] Today's Date: 05/13/2024   History of present illness 73 y/o M admitted to [facility] 5/30 for hernia repair and urostomy revision. 6/9 to OR for perforated bowel, post op shock, s/p exploratory lapartomy with drainage of Rt abdominal wall abscess. CRRT 6/11-6/14. Extubated 6/14, Transferred to [facility] 6/15 for intermittent HD. CRRT again 6/17-6/20. 6/23 bil perc nephrostomy tubes placed. PMHx: bladder and prostate CA, RBBB, CHF, colostomy, DM, GERD, abdominal surgery.   OT comments  Pt making slow progress toward goals this session, needing mod +2 for pivot transfers with RW. Pt needing incr encouragement to stand-pivot to return to bed, and cues for hand positioning/scooting forward in chair in prep for standing. Pt states his dizziness is a lot better this time compared to previous transfers. Pt presenting with impairments listed below, will follow acutely. Patient will benefit from continued inpatient follow up therapy, <3 hours/day to maximize safety/ind with ADL/functional mobility.       If plan is discharge home, recommend the following:  Two people to help with walking and/or transfers;Two people to help with bathing/dressing/bathroom;Assistance with cooking/housework;Assistance with feeding;Direct supervision/assist for medications management;Direct supervision/assist for financial management;Assist for transportation;Help with stairs or ramp for entrance   Equipment Recommendations  Other (comment)    Recommendations for Other Services PT consult    Precautions / Restrictions Precautions Precautions: Fall;Other (comment) Recall of Precautions/Restrictions: Impaired Precaution/Restrictions Comments: bil JP drains, ileostomy, urostomy, abd wound vac, bil perc nephrostomy drains Restrictions Weight Bearing Restrictions Per Provider Order: No       Mobility Bed Mobility Overal bed  mobility: Needs Assistance Bed Mobility: Rolling, Sidelying to Sit Rolling: Mod assist       Sit to sidelying: Min assist General bed mobility comments: min A to assist RLE into bed    Transfers Overall transfer level: Needs assistance Equipment used: Rolling walker (2 wheels) Transfers: Sit to/from Stand, Bed to chair/wheelchair/BSC Sit to Stand: Mod assist, +2 physical assistance, From elevated surface     Step pivot transfers: Mod assist, +2 physical assistance           Balance Overall balance assessment: Needs assistance Sitting-balance support: No upper extremity supported, Feet supported Sitting balance-Leahy Scale: Fair Sitting balance - Comments: able to static sit EOB without UB support   Standing balance support: Reliant on assistive device for balance Standing balance-Leahy Scale: Poor Standing balance comment: RW                           ADL either performed or assessed with clinical judgement   ADL Overall ADL's : Needs assistance/impaired                         Toilet Transfer: Moderate assistance;+2 for physical assistance;Stand-pivot           Functional mobility during ADLs: Maximal assistance;Rolling walker (2 wheels);+2 for physical assistance      Extremity/Trunk Assessment Upper Extremity Assessment Upper Extremity Assessment: Generalized weakness   Lower Extremity Assessment Lower Extremity Assessment: Defer to PT evaluation        Vision   Vision Assessment?: No apparent visual deficits   Perception Perception Perception: Not tested   Praxis Praxis Praxis: Not tested   Communication Communication Communication: Impaired Factors Affecting Communication: Hearing impaired (hearing aids present but not charged)   Cognition Arousal: Alert Behavior During Therapy: Flat affect  Following commands: Impaired Following commands impaired: Follows one step  commands with increased time      Cueing   Cueing Techniques: Verbal cues, Gestural cues, Tactile cues  Exercises      Shoulder Instructions       General Comments VSS on RA    Pertinent Vitals/ Pain       Pain Assessment Pain Assessment: Faces Pain Score: 2  Faces Pain Scale: Hurts a little bit Pain Location: abd Pain Descriptors / Indicators: Sore, Tender Pain Intervention(s): Limited activity within patient's tolerance, Monitored during session, Repositioned  Home Living                                          Prior Functioning/Environment              Frequency  Min 2X/week        Progress Toward Goals  OT Goals(current goals can now be found in the care plan section)  Progress towards OT goals: Progressing toward goals  Acute Rehab OT Goals Patient Stated Goal: none stated OT Goal Formulation: With patient Time For Goal Achievement: 05/25/24 Potential to Achieve Goals: Fair ADL Goals Pt Will Perform Grooming: with min assist;bed level;sitting Pt/caregiver will Perform Home Exercise Program: Both right and left upper extremity;Increased strength;Increased ROM;With Supervision Additional ADL Goal #1: pt will perform bed mobility mod A in prep for seated ADLs  Plan      Co-evaluation                 AM-PAC OT 6 Clicks Daily Activity     Outcome Measure   Help from another person eating meals?: A Little Help from another person taking care of personal grooming?: A Little Help from another person toileting, which includes using toliet, bedpan, or urinal?: A Lot Help from another person bathing (including washing, rinsing, drying)?: A Lot Help from another person to put on and taking off regular upper body clothing?: A Lot Help from another person to put on and taking off regular lower body clothing?: A Lot 6 Click Score: 14    End of Session Equipment Utilized During Treatment: Rolling walker (2 wheels)  OT Visit  Diagnosis: Unsteadiness on feet (R26.81);Other abnormalities of gait and mobility (R26.89);Muscle weakness (generalized) (M62.81)   Activity Tolerance Patient tolerated treatment well   Patient Left with call bell/phone within reach;in bed;with bed alarm set;with family/visitor present   Nurse Communication Mobility status        Time: 9042-8976 OT Time Calculation (min): 26 min  Charges: OT General Charges $OT Visit: 1 Visit OT Treatments $Therapeutic Activity: 23-37 mins  Weyman Bogdon K, OTD, OTR/L SecureChat Preferred Acute Rehab [phone]   Laneta POUR Koonce 05/13/2024, 10:32 AM

## 2024-05-13 NOTE — Progress Notes (Signed)
Pt off unit to Radiology

## 2024-05-13 NOTE — Progress Notes (Signed)
 Chief Complaint: Patient was seen in consultation today for ileal conduit  leak  Procedure: Right Nephroureteral Exchange with possible stenting  Referring Physician(s): Dr. Gretel Ferrara  Supervising Physician: Jenna Hacker  Patient Status: [facility] - In-pt  History of Present Illness: Kristopher Thompson is a 73 y.o. male with a complex medical/surgical history of prostate cancer (s/p robotic prostatectomy), bladder cancer (s/p radical cystectomy and creation of urostomy ileal conduit in 2018 with recent revision on 04/10/24 with Dr. Sheldon) complicated by acute bowel perforation (s/p bowel repair with ileal resection and end ileostomy hernia repairs on 04/19/24) who is currently admitted for septic shock with AKI and oliguria. He has been intermittently requiring CRRT. He has recently undergone multiple nephrostomy tube exchanges due to renal obstruction and urine leaks; last exchange of bilateral tubes on 05/11/24 with Dr. KANDICE Moan. Following this exchange, RLQ JP drain Cr levels still suggesting a persistent urine leak. Subsequent antegrade nephrostogram displayed no ureteral leak making the ileal conduit as the most likely location. Dr. Jenna reviewed case and approved patient for right nephroureteral exchange with possible stenting.   Patient resting in bed with his wife at the bedside and physical therapy in the room. States that he is generally feeling better, but is very thirsty. He is both eager and nervous to have his procedure done today. Denies any fevers/chills, abdominal pain, pain around drain sites, or shortness of breath. All questions and concerns answered at the bedside.   Code Status: Full Code  Past Medical History:  Diagnosis Date   At risk for sleep apnea    12-25-2017   STOP-BANG SCORE= 5   --- SENT TO PCP   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   moderate atypia - right low back   Atypical nevus 04/04/2007   moderate to marked - right upper back (wider shave)   Atypical  nevus 04/04/2007   moderate atypia - center chest (wider shave)   Atypical nevus 04/04/2007   slight atypia - right thigh   Atypical nevus 11/29/2011   mild atypia - center upper back   Atypical nevus 11/29/2011   mild atypia - center chest   Bacteremia due to Klebsiella pneumoniae 10/09/2017   Bladder cancer (HCC) dx 07/2017   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  s/p  cystectomy w/ ileal conduit urinary diversion   Candida infection    CHF (congestive heart failure) (HCC)    Colostomy in place (HCC)    since 08-08-2017-- per pt 12-25-2017 reddness around stoma   Diabetes mellitus without complication (HCC)    GERD (gastroesophageal reflux disease)    H/O hiatal hernia    History of sepsis 09/2017   dx bacteremia due to klebsiella pneumoniae,  post op intraabdominal abscess   Prostate cancer [facility]) urologist-- dr ferrara   10-02-2012 s/p  prostatectomy-- Stage T1c   RBBB    Renal disorder    pt. denies   Sleep apnea    cpap   Squamous cell carcinoma of skin 05/22/2013   left cheek - CX3 + 5FU   Wears glasses     Past Surgical History:  Procedure Laterality Date   ABDOMINAL SURGERY     APPENDECTOMY  1972   BOWEL RESECTION N/A 04/20/2024   Procedure: SMALL BOWEL RESECTION;  Surgeon: [name], MD;  Location: [facility];  Service: General;  Laterality: N/A;   CHOLECYSTECTOMY  1985   COLONOSCOPY N/A 06/29/2021   Procedure: COLONOSCOPY;  Surgeon: Debby Hila, MD;  Location: [facility];  Service: Endoscopy;  Laterality: N/A;   COLOSTOMY REVERSAL N/A 01/08/2018   Procedure: COLOSTOMY REVERSAL;  Surgeon: Debby Hila, MD;  Location: [facility];  Service: General;  Laterality: N/A;   CYSTOSCOPY WITH RETROGRADE PYELOGRAM, URETEROSCOPY AND STENT PLACEMENT Right 06/10/2017   Procedure: CYSTOSCOPY WITH RIGHT URETEROSCOPY WITH RIGHT STENT PLACEMENT;  Surgeon: [name], MD;  Location: [facility];  Service: Urology;  Laterality: Right;   EUS N/A 04/16/2018   Procedure: FULL UPPER  ENDOSCOPIC ULTRASOUND (EUS) RADIAL;  Surgeon: Burnette Fallow, MD;  [facility];  Service: Endoscopy;  Laterality: N/A;   FLEXIBLE SIGMOIDOSCOPY N/A 12/13/2017   Procedure: FLEXIBLE SIGMOIDOSCOPY;  Surgeon: Debby Hila, MD;  [facility];  Service: Endoscopy;  Laterality: N/A;   ILEO CONDUIT     IR CATHETER TUBE CHANGE  12/13/2017   IR CATHETER TUBE CHANGE  01/17/2018   IR CATHETER TUBE CHANGE  02/28/2018   IR CATHETER TUBE CHANGE  07/18/2018   IR CATHETER TUBE CHANGE  08/22/2018   IR CATHETER TUBE CHANGE  10/31/2018   IR CONVERT LEFT NEPHROSTOMY TO NEPHROURETERAL CATH  10/24/2017   IR EXT NEPHROURETERAL CATH EXCHANGE  05/19/2018   IR EXT NEPHROURETERAL CATH EXCHANGE  06/16/2018   IR NEPHRO TUBE REMOV/FL  10/24/2017   IR NEPHROSTOGRAM LEFT THRU EXISTING ACCESS  12/05/2018   IR NEPHROSTOMY EXCHANGE LEFT  05/11/2024   IR NEPHROSTOMY PLACEMENT LEFT  10/07/2017   IR NEPHROSTOMY PLACEMENT RIGHT  05/04/2024   IR NEPHROSTOMY TUBE CHANGE  04/11/2018   LAPAROTOMY N/A 04/20/2024   Procedure: EXPLORATORY LAPAROTOMY;  Surgeon: [name], MD;  Location: [facility];  Service: General;  Laterality: N/A;   PARTIAL COLECTOMY N/A 04/21/2024   Procedure: COLECTOMY, PARTIAL;  Surgeon: [name], MD;  Location: [facility];  Service: General;  Laterality: N/A;  Removal Wound Vac, Washout Ostomy, Possible Anastomosis, Possible Ileostomy.  Phasix Mesh.   POLYPECTOMY  06/29/2021   Procedure: POLYPECTOMY;  Surgeon: Debby Hila, MD;  [facility];  Service: Endoscopy;;   ROBOT ASSISTED LAPAROSCOPIC RADICAL PROSTATECTOMY  10/02/2012   Procedure: ROBOTIC ASSISTED LAPAROSCOPIC RADICAL PROSTATECTOMY LEVEL 2;  Surgeon: Noretta Renda, MD;  Location: [facility];  Service: Urology;  Laterality: N/A;   ROBOTIC ASSISTED LAPAROSCOPIC BLADDER DIVERTICULECTOMY N/A 08/08/2017   Procedure: XI ROBOTIC ASSISTED LAPAROSCOPIC RADICAL CYSTECTOMY COVERTED TO OPEN PELVIC LYMPHADNECTOMY BILATERAL AND ILEAL CONDUIT URINARY  DIVERSION;  Surgeon: [name], MD;  [facility];  Service: Urology;  Laterality: N/A;   TRANSURETHRAL RESECTION OF BLADDER TUMOR  06/10/2017   Procedure: TRANSURETHRAL RESECTION OF BLADDER TUMOR (TURBT);  Surgeon: [name], MD;  [facility];  Service: Urology;;   VACUUM ASSISTED CLOSURE CHANGE N/A 04/20/2024   Procedure: PLACEMENT OF CONCETTA CAPES;  Surgeon: [name], MD;  Location[facility];  Service: General;  Laterality: N/A;   VENTRAL HERNIA REPAIR N/A 04/10/2024   Procedure: REPAIR, HERNIA, VENTRAL;  Surgeon: [name], MD;  [facility];  Service: General;  Laterality: N/A;   XI ROBOT ABDOMINAL PERINEAL RESECTION N/A 08/08/2017   Procedure: REPAIR OF RECTAL TEAR POSSIBLE PARTIAL PROCTECTOMY, CREATION OF  OSTOMY;  Surgeon: Debby Hila, MD;  Location[facility];  Service: General;  Laterality: N/A;   XI ROBOTIC ASSISTED PARASTOMAL HERNIA REPAIR N/A 04/10/2024   Procedure: REPAIR, HERNIA, PARASTOMAL AND VENTRAL HERNIAS, ROBOT-ASSISTED, LEFT INGUINAL HERNIA, LYSIS OF ADHESIONS INCARCERATED AND INSIONAL HERNIAS AND UROSTOMY REVISION;  Surgeon: [name], MD;  Location: [facility];  Service: General;  Laterality: N/A;    Allergies: Demerol  [meperidine ]  Medications: Prior to Admission medications   Medication Sig Start Date End Date Taking? Authorizing Provider  acetaminophen  (TYLENOL ) 500 MG tablet Take 1,000 mg by mouth every 8 (eight) hours as needed for mild pain or headache.    Yes [provider]  bisacodyl  (DULCOLAX) 5 MG EC tablet Take 2 tablets (10 mg total) by mouth daily as needed for moderate constipation. Patient taking differently: Take 15 mg by mouth daily. 12/09/18  Yes Sebastian Toribio GAILS, MD  Cyanocobalamin  (VITAMIN B-12 PO) Place 1 tablet under the tongue 3 (three) times a week.   Yes [provider]  cyclobenzaprine  (FLEXERIL ) 5 MG tablet Take 1 tablet (5 mg total) by mouth 3 (three) times daily as needed for muscle spasms. 04/10/24   Yes [name], MD  D-MANNOSE PO Take 2-3 tablets by mouth See admin instructions. Take 2 tablets by mouth every morning & take 1 tablet by mouth at bedtime.   Yes [provider]  diphenhydrAMINE  (BENADRYL ) 25 mg capsule Take 25 mg by mouth every other day as needed (congestion/allergies (every other night)).   Yes [provider]  ELIQUIS 5 MG TABS tablet Take 5 mg by mouth 2 (two) times daily.   Yes [provider]  ipratropium (ATROVENT ) 0.03 % nasal spray Place 1 spray into both nostrils 3 (three) times daily as needed (congestion).   Yes [provider]  metFORMIN (GLUCOPHAGE) 500 MG tablet Take 500 mg by mouth in the morning. 01/29/24  Yes [provider]  Multiple Vitamins-Minerals (CENTRUM MINIS ADULTS 50+) TABS Take 1 tablet by mouth See admin instructions. Take 1 tablet by mouth twice daily on every other day.   Yes [provider]  Omega-3 Fatty Acids (FISH OIL PO) Take 1 capsule by mouth 3 (three) times a week.   Yes [provider]  oxyCODONE -acetaminophen  (PERCOCET) 5-325 MG tablet Take 1 tablet by mouth every 6 (six) hours as needed for severe pain (pain score 7-10). Can increase to 2 pills at a time if needed 04/10/24  Yes Gross, Standing, MD  polyethylene glycol (MIRALAX ) 17 g packet Take 17 g by mouth daily as needed (constipation.).   Yes [provider]  rosuvastatin  (CRESTOR ) 10 MG tablet Take 10 mg by mouth every evening.   Yes [provider]  triamcinolone  cream (KENALOG ) 0.1 % Apply 1 application topically daily as needed. 10/17/21  Yes [name], MD     Family History  Problem Relation Age of Onset   Lung cancer Mother    Hypertension Father    Colon cancer Other    CAD Neg Hx    Diabetes Neg Hx    Stroke Neg Hx     Social History   Socioeconomic History   Marital status: Married    Spouse name: Not on file   Number of children: Not on file   Years of education: Not on file    Highest education level: Not on file  Occupational History   Not on file  Tobacco Use   Smoking status: Former    Current packs/day: 0.00    Types: Cigarettes    Start date: 12/24/1970    Quit date: 12/25/1979    Years since quitting: 44.4   Smokeless tobacco: Never  Vaping Use   Vaping status: Never Used  Substance and Sexual Activity   Alcohol  use: Yes    Comment: rare   Drug use: No   Sexual activity: Yes  Other Topics Concern   Not  on file  Social History Narrative   Not on file   Social Drivers of Health   Financial Resource Strain: Not on file  Food Insecurity: No Food Insecurity (04/12/2024)   Hunger Vital Sign    Worried About Running Out of Food in the Last Year: Never true    Ran Out of Food in the Last Year: Never true  Transportation Needs: No Transportation Needs (04/12/2024)   PRAPARE - Administrator, Civil Service (Medical): No    Lack of Transportation (Non-Medical): No  Physical Activity: Not on file  Stress: Not on file  Social Connections: Socially Integrated (04/10/2024)   Social Connection and Isolation Panel    Frequency of Communication with Friends and Family: More than three times a week    Frequency of Social Gatherings with Friends and Family: Three times a week    Attends Religious Services: More than 4 times per year    Active Member of Clubs or Organizations: Yes    Attends Banker Meetings: 1 to 4 times per year    Marital Status: Married    Review of Systems Denies any N/V, chest pain, shortness of breath, fevers/chills. All other ROS negative.  Vital Signs: BP 115/83 (BP Location: Left Arm)   Pulse (!) 107   Temp 97.6 F (36.4 C) (Axillary)   Resp 17   Ht 5' 11 (1.803 m)   Wt 218 lb 14.7 oz (99.3 kg)   SpO2 99%   BMI 30.53 kg/m    Physical Exam Vitals reviewed.  Constitutional:      Appearance: Normal appearance.  HENT:     Head: Normocephalic and atraumatic.     Mouth/Throat:     Mouth: Mucous  membranes are moist.     Pharynx: Oropharynx is clear.  Cardiovascular:     Rate and Rhythm: Regular rhythm. Tachycardia present.  Pulmonary:     Effort: Pulmonary effort is normal.     Breath sounds: Normal breath sounds.  Abdominal:     General: Abdomen is flat.     Palpations: Abdomen is soft.     Tenderness: There is abdominal tenderness (appropriately tender).     Comments: Ileostomy with small, soft stool in bag Urostomy with clear, yellow urine Bilateral nephrostomy tubes in place; left nephrostomy with ~100cc clear, yellow fluid; right nephrostomy with ~200cc clear, yellow fluid Bilateral anterior abdominal drains now with gravity bags attached; no fluid in bags Wound vac in place  Musculoskeletal:        General: Normal range of motion.     Cervical back: Normal range of motion.  Skin:    General: Skin is warm and dry.  Neurological:     General: No focal deficit present.     Mental Status: He is alert and oriented to person, place, and time.  Psychiatric:        Mood and Affect: Mood normal.        Behavior: Behavior normal.     Imaging: DG Loopogram Result Date: 05/12/2024 CLINICAL DATA:  [id number] Complication of Ileal conduit [facility]) [id number] Patient with ileal conduit, s/p recent nephrostogram with Dr. Luverne at time of nephrostomy exchange. Concern for conduit leak due to presence of urine in surgical drains. Team has requested introduction of contrast into the conduit to be followed by CT imaging. EXAM: WATER  SOLUBLE CONTRAST LOOPOGRAM TECHNIQUE: The urostomy bag was removed as his current device did not allow entry from port used to empty the collection bag.  The skin was cleansed and a scout image was obtained. An 8 Fr Foley was inflated outside of the urostomy. The tip was inserted and the balloon used to gently occlude the stoma. Omni 300 was introduced through the catheter. This technique was not adequate to prevent leakage. After discussion with Dr. Hughes, the 8 Fr  Foley was introduced several cm into the stoma and balloon inflated with 5 mL saline within the stoma. Contrast was then introduced without leakage. Approximately 70 mL of water  soluble contrast ws introduce to the system; at that time, leakage around the stoma was noted and instillation was stopped. Fluoroscopy images were obtained throughout the procedure. The balloon was completely deflated and removed from the stoma. FLUOROSCOPY: Radiation Exposure Index and estimated peak skin dose (PSD); Reference air kerma (RAK), 6.5 mGy. Kerma-area product (KAP), 217.5 uGy*m. COMPARISON:  05/11/24 Nephrostogram FINDINGS: 1. Contrast introduced via catheter is seen within the ileal conduit and retrograde opacification of the RIGHT renal collecting system. 2. The LEFT renal collecting system did not opacify. Patient to immediately transfer to CT imaging area for CT AP for further evaluation. IMPRESSION: 1. Contrast filling within the ileal conduit and retrograde opacification of the RIGHT renal collecting system. 2. The LEFT renal collecting system did not opacify. Electronically Signed   By: Thom Hughes M.D.   On: 05/12/2024 16:33   CT ABDOMEN PELVIS WO CONTRAST Result Date: 05/12/2024 CLINICAL DATA:  Status post prior cystectomy with ileal conduit formation. Recent surgery for bowel perforation. Suspected urine leak near the level ileal conduit due to surgical drain fluid return demonstrating elevated creatinine. EXAM: CT ABDOMEN AND PELVIS WITHOUT CONTRAST TECHNIQUE: Multidetector CT imaging of the abdomen and pelvis was performed following the standard protocol without IV contrast. Prior to the CT study, contrast was injected via a right lower quadrant urostomy into the ileal conduit RADIATION DOSE REDUCTION: This exam was performed according to the departmental dose-optimization program which includes automated exposure control, adjustment of the mA and/or kV according to patient size and/or use of iterative  reconstruction technique. COMPARISON:  Recent CT studies on 05/03/2024 and 04/19/2024 FINDINGS: Lower chest: No acute abnormality. Hepatobiliary: No focal liver abnormality is seen. Status post cholecystectomy. No biliary dilatation. Pancreas: Unremarkable. No pancreatic ductal dilatation or surrounding inflammatory changes. Spleen: Normal in size without focal abnormality. Adrenals/Urinary Tract: Bilateral percutaneous nephrostomy tubes are present with pigtail portions formed in the renal pelvis bilaterally. There is some distension of the right renal collecting system with contrast due to free reflux of contrast injected into the ileal conduit with contrast opacification of the entire right ureter and renal collecting system. 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 This stenosis was characterized by nephrostogram yesterday. Status post cystectomy. Stomach/Bowel: Ileal conduit is well distended with injected contrast. The urostomy itself appears focally stenotic just before the abdominal wall within the subcutaneous fat. There then is another segment of stenosis right at the abdominal wall and extending just into the peritoneal cavity where there may be a very subtle pinpoint leak extending laterally over approximately 2 cm and paralleling just superior to one of the surgical drains that originates in the fall left lateral abdomen and ultimately crosses over into the far right lateral lower abdomen. There is an additional more obvious leak along the inferior aspect of the conduit on axial images 75-83 and also seen well on coronal reconstructions beginning  as a thin column of extravasated injected contrast measuring approximately 4-5 mm in width and extending into the right lower abdominal wall at the level of one of the transversely oriented surgical drains. No evidence of bowel obstruction, ileus or  free intraperitoneal air. Feeding tube extends into the mid stomach. Vascular/Lymphatic: No significant vascular findings are present. No enlarged abdominal or pelvic lymph nodes. Reproductive: No significant findings. Other: Edema of the right lower muscle wall and some ill-defined fluid in the right lower abdominal wall which appears stable since the prior CT. Musculoskeletal: No acute or significant osseous findings. IMPRESSION: 1. 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 There then is another segment of stenosis right at the abdominal wall and extending just into the peritoneal cavity where there may be a very subtle pinpoint leak extending laterally over approximately 2 cm and paralleling just superior to one of the surgical drains that originates in the fall left lateral abdomen and ultimately crosses over into the far right lateral lower abdomen. 2. There is an additional more obvious leak along the inferior aspect of the conduit beginning as a thin column of extravasated injected contrast measuring approximately 4-5 mm in width and extending into the right lower abdominal wall at the level of one of the transversely oriented surgical drains. 3. Bilateral percutaneous nephrostomy tubes are present with pigtail portions formed in the renal pelvis bilaterally. There is some distension of the right renal collecting system with contrast due to free reflux of contrast injected into the ileal conduit with contrast opacification of the entire right ureter and renal collecting system. 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 This stenosis was characterized by nephrostogram yesterday. 4. Edema of the right lower muscle wall and some ill-defined fluid in the right lower abdominal wall which  appears stable since the prior CT. Electronically Signed   By: Marcey Moan M.D.   On: 05/12/2024 14:32   IR NEPHROSTOMY EXCHANGE BILATERAL Result Date: 05/11/2024 INDICATION: Status post prior cystectomy with ileal conduit formation. Recent bowel surgery for bowel perforation. Concern for possible urine leak. EXAM: 1. LEFT PERCUTANEOUS NEPHROSTOMY TUBE EXCHANGE UNDER FLUOROSCOPY INCLUDING FULL ANTEGRADE NEPHROSTOGRAM 2. RIGHT PERCUTANEOUS NEPHROSTOMY TUBE EXCHANGE UNDER FLUOROSCOPY INCLUDING FULL ANTEGRADE NEPHROSTOGRAM COMPARISON:  None Available. MEDICATIONS: None ANESTHESIA/SEDATION: None CONTRAST:  50 mL Omnipaque  300-administered into the collecting system(s) FLUOROSCOPY: Radiation Exposure Index (as provided by the fluoroscopic device): 111 mGy Kerma COMPLICATIONS: None immediate. PROCEDURE: Informed written consent was obtained from the patient after a thorough discussion of the procedural risks, benefits and alternatives. All questions were addressed. Maximal Sterile Barrier Technique was utilized including caps, mask, sterile gowns, sterile gloves, sterile drape, hand hygiene and skin antiseptic. A timeout was performed prior to the initiation of the procedure. Both percutaneous nephrostomy tubes were injected with contrast material under fluoroscopy and multiple fluoroscopy loops and images were saved. The left percutaneous nephrostomy tube was then removed over a guidewire and exchanged for a 5 French catheter over a wire. The catheter was further advanced into the left ureter and additional nephrostogram performed to assess ureteral patency. A new 10 French percutaneous nephrostomy tube was then formed over a guidewire in the left renal pelvis A new 10 French right percutaneous nephrostomy tube was then exchanged over a guidewire and formed in the renal pelvis.  Both nephrostomy tubes were secured at the skin with Prolene retention sutures, adhesive StatLock devices and attached to new gravity  drainage bags. FINDINGS: Injection of the left percutaneous nephrostomy tube demonstrates retraction of the tube which is peripherally located in a lower pole calyx. Contrast injection demonstrates left-sided hydronephrosis and poor drainage into the ileal conduit with suggestion of a distal ureteral stricture. This was confirmed by advancement of a 5 French catheter into the distal ureter with contrast injection demonstrating a high-grade stricture but flow of contrast through the stricture and into the ileal conduit once a catheter was advanced near the level of the stricture. There is no evidence of left-sided urine leak. The left nephrostomy tube was replaced with a new 10 French catheter formed at the level of the renal pelvis. A right nephrostogram demonstrates retraction the right nephrostomy tube into a lower pole calyx. Contrast injection demonstrates good flow of contrast into the right ureter and ileal conduit without evidence of ureteral stricture or urine leak. The right nephrostomy tube was replaced with a new 10 French catheter formed at the level of the renal pelvis. IMPRESSION: 1. 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 Injection of contrast at the level of the distal ureter does show flow through the stricture and into the ileal conduit. No left-sided urine leak demonstrated. 2. Right nephrostogram demonstrates normally patent ureter and ureteral anastomosis with the ileal conduit. No right-sided urine leak demonstrated. 3. Both nephrostomy tubes had retracted into lower pole calices and were replaced with new 10 French catheters formed at the level of the renal pelvis bilaterally. Electronically Signed   By: Marcey Moan M.D.   On: 05/11/2024 12:35   DG CHEST PORT 1 VIEW Result Date:  05/09/2024 CLINICAL DATA:  10026 Shortness of breath 10026 EXAM: PORTABLE CHEST - 1 VIEW COMPARISON:  04/22/2024 FINDINGS: Interval extubation. Feeding tube placement at least as far as the stomach, tip not seen. Stable right arm PICC. Interval removal of left IJ dialysis catheter. Lungs clear. Heart size and mediastinal contours are within normal limits. No effusion. Visualized bones unremarkable. IMPRESSION: Interval extubation. No acute cardiopulmonary disease. Electronically Signed   By: JONETTA Faes M.D.   On: 05/09/2024 11:53   IR NEPHROSTOMY PLACEMENT BILATERAL Result Date: 05/04/2024 INDICATION: 73 year old male with history of cystectomy with ileal conduit presenting with bilateral distal renal obstruction. EXAM: 1. ULTRASOUND GUIDANCE FOR PUNCTURE OF THE left RENAL COLLECTING SYSTEM 2. Left PERCUTANEOUS NEPHROSTOMY TUBE PLACEMENT. 3. ULTRASOUND GUIDANCE FOR PUNCTURE OF THE right RENAL COLLECTING SYSTEM 4. Right PERCUTANEOUS NEPHROSTOMY TUBE PLACEMENT. COMPARISON:  None Available. MEDICATIONS: The patient was currently receiving intravenous antibiotics as an inpatient. No additional antibiotics were administered. ANESTHESIA/SEDATION: Moderate (conscious) sedation was employed during this procedure. A total of Versed  2 mg and Fentanyl  100 mcg was administered intravenously. Moderate Sedation Time: 17 minutes. The patient's level of consciousness and vital signs were monitored continuously by radiology nursing throughout the procedure under my direct supervision. CONTRAST:  Fifteen mL Isovue  300 - administered into the renal collecting system FLUOROSCOPY TIME:  Eleven mGy reference air kerma COMPLICATIONS: None immediate. PROCEDURE: The procedure, risks, benefits, and alternatives were explained to the patient. Questions regarding the procedure were encouraged and answered. The patient understands and consents to the procedure. A timeout was performed prior to the initiation of the procedure. The bilateral  flank regions were prepped and draped in the usual sterile fashion and a sterile drape was applied covering the operative field. A sterile gown and sterile gloves were used for the procedure. Local anesthesia was provided with 1% Lidocaine  with epinephrine . Ultrasound was used to localize the left kidney. Under direct ultrasound guidance, a 20 gauge needle was advanced into the renal collecting system. An ultrasound image documentation was performed. Access within the collecting system was confirmed with the efflux of urine followed by limited contrast injection. Over a Nitrex wire, the tract was dilated with an Accustick stent. Next, under intermittent fluoroscpic guidance and over a short Amplatz wire, the track was dilated ultimately allowing placement of a 10-French percutaneous nephrostomy catheter which was advanced to the level of the renal pelvis where the coil was formed and locked. Contrast was injected and several spot fluoroscopic images were obtained in various obliquities. The catheter was secured at the skin with a Prolene retention suture and stat lock device and connected to a gravity bag was placed. Ultrasound was used to localize the right kidney. Under direct ultrasound guidance, a 20 gauge needle was advanced into the renal collecting system. An ultrasound image documentation was performed. Access within the collecting system was confirmed with the efflux of urine followed by limited contrast injection. Over a Nitrex wire, the tract was dilated with an Accustick stent. Next, under intermittent fluoroscpic guidance and over a short Amplatz wire, the track was dilated ultimately allowing placement of a 10-French percutaneous nephrostomy catheter which was advanced to the level of the renal pelvis where the coil was formed and locked. Contrast was injected and several spot fluoroscopic images were obtained in various obliquities. The catheter was secured at the skin with a Prolene retention suture  and stat lock device and connected to a gravity bag was placed. Dressings were applied. The patient tolerated procedure well without immediate postprocedural complication. FINDINGS: Ultrasound scanning demonstrates a moderate to severely dilated bilateral collecting systems. Under a combination of ultrasound and fluoroscopic guidance, a posterior inferior calix was targeted allowing placement of bilateral 10-French percutaneous nephrostomy catheters with ends coiled and locked within the renal pelves. Contrast injection confirmed appropriate positioning. IMPRESSION: Successful ultrasound and fluoroscopic guided placement of a bilateral 10 French percutaneous nephrostomy tubes. Ester Sides, MD [facility] Electronically Signed   By: Ester Sides M.D.   On: 05/04/2024 16:17   CT ABDOMEN PELVIS W CONTRAST Result Date: 05/03/2024 CLINICAL DATA:  Bladder dysfunction (Ped 0-17y). 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 EXAM: CT ABDOMEN AND PELVIS WITH CONTRAST TECHNIQUE: Multidetector CT imaging of the abdomen and pelvis was performed using the standard protocol following bolus administration of intravenous contrast. RADIATION DOSE REDUCTION: This exam was performed according to the departmental dose-optimization program which includes automated exposure control, adjustment of the mA and/or kV according to patient size and/or use of iterative reconstruction technique. CONTRAST:  75mL OMNIPAQUE  IOHEXOL  350 MG/ML SOLN COMPARISON:  CT abdomen 04/19/2024 FINDINGS: Lower chest: Coronary artery calcification.  No acute abnormality. Hepatobiliary: No focal liver abnormality. Status post cholecystectomy. No biliary dilatation. Pancreas: Coarse calcifications throughout the parenchyma. No focal lesion. Normal pancreatic contour. No surrounding inflammatory changes. No main pancreatic ductal dilatation. Spleen: Normal in size without focal  abnormality. Adrenals/Urinary Tract: No adrenal nodule bilaterally. Bilateral kidneys enhance symmetrically. Persistent severe left hydroureteronephrosis and moderate right hydroureteronephrosis. Renal parenchymal thinning and scarring on the left. Status post cystectomy. Right lower abdominal wall ileal conduit  formation (3:81). Stomach/Bowel: Right abdominal lower anterior abdominal wall and ileostomy formation. PO contrast opacifies the large bowel. Enteric tube with tip and side port within the gastric lumen. Stomach is within normal limits. No evidence of bowel wall thickening or dilatation. Colonic diverticulosis. Appendix appears normal. Vascular/Lymphatic: No abdominal aorta or iliac aneurysm. Moderate atherosclerotic plaque of the aorta and its branches. No abdominal, pelvic, or inguinal lymphadenopathy. Reproductive: Prostatectomy. Other: Bilateral lower abdominal wall surgical drains again noted with tips terminating within the right lower quadrant. Interval development of an organized fluid collection measuring 4.3 x 3.6 cm within the right anterolateral abdominal musculature with finding that appears to be contiguous with the peritoneum (3:46). Associated free fluid along the right lateral anterior abdomen that appears to possibly be originating from the ileum where ileal bowel sutures are noted within the right abdomen (3:68). Question fistulization and dehiscence of the ileal bowel sutures with the right abdomen of soft tissues (3:69). Associated free fluid along the right lateral anterior abdomen with extension into the soft tissue and fistulization not excluded between the abscess and the dermis (3:60). No free gas. Musculoskeletal: Possible developing supraumbilical tiny ventral hernia containing fluid. No suspicious lytic or blastic osseous lesions. No acute displaced fracture. IMPRESSION: 1. Interval development of an organized fluid collection measuring 4.3 x 3.6 cm within the right  anterolateral abdominal musculature with finding that appears to be contiguous with the peritoneum. Extensive inflammatory changes along the abdomen and abdominal soft tissues on the right. Question fistulization and dehiscence of the ileal bowel sutures with the right abdomen of soft tissues. 2. Ileal conduit appears to course adjacent the inflammatory changes with no urothelial thickening to suggest superimposed renal infection. 3. Possible developing supraumbilical tiny ventral hernia containing fluid. 4. Chronic pancreatitis with no findings of acute pancreatitis. 5. Colonic diverticulosis with no acute diverticulitis. 6. 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 7. Persistent severe left hydroureteronephrosis and moderate right hydroureteronephrosis. 8.  Aortic Atherosclerosis (ICD10-I70.0). Electronically Signed   By: Morgane  Naveau M.D.   On: 05/03/2024 16:42   DG Abd Portable 1V Result Date: 04/27/2024 CLINICAL DATA:  Feeding tube placement EXAM: PORTABLE ABDOMEN - 1 VIEW COMPARISON:  04/15/2024 FINDINGS: Frontal view of the lower chest and upper abdomen demonstrates enteric catheter tip projecting over the gastric body. Bowel gas pattern is unremarkable. No acute bony abnormalities. IMPRESSION: 1. Enteric catheter tip projecting over the gastric body. Electronically Signed   By: Ozell Daring M.D.   On: 04/27/2024 15:11   DG Chest Port 1 View Result Date: 04/22/2024 CLINICAL DATA:  Central line placement. EXAM: PORTABLE CHEST 1 VIEW COMPARISON:  04/20/2024. FINDINGS: Left IJ hemodialysis CVC catheter tip overlies the lower SVC. Right PICC tip overlies the superior cavoatrial junction. Endotracheal tube tip is located proximally 4 cm above the carina. Enteric tube tip and side port within the stomach. Low lung volumes with unchanged cardiomediastinal silhouette. No pneumothorax. Left basilar opacity may reflect atelectasis or  infiltrate. Mild right basilar atelectasis. IMPRESSION: 1. Left IJ hemodialysis CVC catheter tip overlies the lower SVC. 2. Additional life-support apparatus, as above. 3. Low lung volumes with left basilar opacity, which may reflect atelectasis or infiltrate. Mild right basilar atelectasis. Electronically Signed   By: Harrietta Sherry M.D.   On: 04/22/2024 12:25   DG CHEST PORT 1 VIEW Result Date: 04/20/2024 CLINICAL DATA:  Check endotracheal tube placement EXAM: PORTABLE CHEST 1 VIEW COMPARISON:  Film from the previous day. FINDINGS:  Tracheal tube is noted 3.6 cm above the carina. Right PICC is seen with the catheter tip at the cavoatrial junction stable from the prior exam. Gastric catheter extends into the stomach. Overall inspiratory effort is poor although no focal confluent infiltrate is seen. IMPRESSION: Tubes and lines as described.  No acute abnormality noted. Electronically Signed   By: Oneil Devonshire M.D.   On: 04/20/2024 03:26   DG Chest Port 1 View Result Date: 04/19/2024 CLINICAL DATA:  Respiratory distress EXAM: PORTABLE CHEST 1 VIEW COMPARISON:  04/15/2024 FINDINGS: Right PICC tip in the right atrium. Subdiaphragmatic enteric tube. Stable cardiomediastinal silhouette. Bibasilar atelectasis. Otherwise no focal consolidation. No pleural effusion or pneumothorax. No displaced rib fractures. IMPRESSION: Bibasilar atelectasis. Electronically Signed   By: Norman Gatlin M.D.   On: 04/19/2024 22:49   CT ABDOMEN PELVIS WO CONTRAST Result Date: 04/19/2024 CLINICAL DATA:  Postoperative abdominal pain. EXAM: CT ABDOMEN AND PELVIS WITHOUT CONTRAST TECHNIQUE: Multidetector CT imaging of the abdomen and pelvis was performed following the standard protocol without IV contrast. RADIATION DOSE REDUCTION: This exam was performed according to the departmental dose-optimization program which includes automated exposure control, adjustment of the mA and/or kV according to patient size and/or use of iterative  reconstruction technique. COMPARISON:  CT abdomen pelvis dated 04/14/2024. FINDINGS: Evaluation of this exam is limited in the absence of intravenous contrast. Lower chest: Trace right pleural effusion. There are bibasilar subsegmental atelectasis. There is coronary vascular calcification. Partially visualized tip of central venous line at the cavoatrial junction. Small pockets of pneumoperitoneum.  No significant free fluid. Hepatobiliary: The liver is grossly unremarkable. No biliary dilatation. Cholecystectomy. Pancreas: Scattered pancreatic calcification sequela of chronic pancreatitis. No active inflammatory changes. Spleen: Normal in size without focal abnormality. Adrenals/Urinary Tract: The adrenal glands unremarkable. Similar appearance of moderate right and severe left hydronephrosis with moderate left renal parenchyma atrophy. There is bilateral hydroureter. Postsurgical changes of cystectomy with a right lower quadrant ileal conduit. No stone. Stomach/Bowel: Enteric tube with tip in the body of the stomach. Multiple dilated and contrast filled small bowel loops measure up to approximately 5 cm in the left lower abdomen. No discrete transition noted. Findings favor to represent reactive ileus. Contrast noted in the colon. There is sigmoid diverticulosis. The appendix is not visualized with certainty. No inflammatory changes identified in the right lower quadrant. Vascular/Lymphatic: Mild aortoiliac atherosclerotic disease. The IVC is unremarkable. No portal venous gas. There is no adenopathy. Reproductive: Prostatectomy. Other: Postsurgical changes of anterior abdominal wall. There is air-fluid collection in the subcutaneous soft tissues of the right anterior pelvic wall extending from the midline to the ileostomy measuring approximately 5 x 12 cm. A drainage catheter noted within this collection. Additional drainage catheter noted in the pelvis. Musculoskeletal: Degenerative changes of the spine. No  acute osseous pathology. IMPRESSION: 1. Postsurgical changes of cystectomy with a right lower quadrant ileal conduit. 2. Air-fluid collection in the subcutaneous soft tissues of the right anterior pelvic wall extending from the midline to the ileostomy. A drainage catheter noted within this collection. 3. Multiple dilated and contrast filled small bowel loops favor to represent reactive ileus. 4. Sigmoid diverticulosis. 5. Similar appearance of moderate right and severe left hydronephrosis with moderate left renal parenchyma atrophy. 6. Additional postoperative changes and related small pneumoperitoneum similar to prior CT. 7.  Aortic Atherosclerosis (ICD10-I70.0). Electronically Signed   By: Vanetta Chou M.D.   On: 04/19/2024 12:09   CT HEAD WO CONTRAST ( ) Result Date: 04/16/2024 CLINICAL DATA:  Delirium  EXAM: CT HEAD WITHOUT CONTRAST TECHNIQUE: Contiguous axial images were obtained from the base of the skull through the vertex without intravenous contrast. RADIATION DOSE REDUCTION: This exam was performed according to the departmental dose-optimization program which includes automated exposure control, adjustment of the mA and/or kV according to patient size and/or use of iterative reconstruction technique. COMPARISON:  None Available. FINDINGS: Brain: No acute intracranial hemorrhage. No CT evidence of acute infarct. No edema, mass effect, or midline shift. The basilar cisterns are patent. Mild parenchymal volume loss. Ventricles: The ventricles are normal. Vascular: No hyperdense vessel or unexpected calcification. Skull: No acute or aggressive finding. Orbits: Orbits are symmetric. Sinuses: Mucosal thickening in the ethmoid and maxillary sinuses. No air-fluid levels. Other: Mastoid air cells are clear. Partially visualized nasoenteric tube. IMPRESSION: No CT evidence of acute intracranial abnormality. Electronically Signed   By: Donnice Mania M.D.   On: 04/16/2024 08:09   DG Abd 1 View Result Date:  04/15/2024 CLINICAL DATA:  NG tube placement EXAM: ABDOMEN - 1 VIEW COMPARISON:  04/13/2024 FINDINGS: Limited field of view for tube placement verification purposes. An enteric tube is present with tip projecting over the left upper quadrant consistent with location in the body of the stomach. Surgical clips in the right upper quadrant. Visualized bowel gas pattern is normal. Linear atelectasis in the lung bases. IMPRESSION: Enteric tube tip projects over the left upper quadrant consistent with location in the body of the stomach. Electronically Signed   By: Elsie Gravely M.D.   On: 04/15/2024 17:57   DG CHEST PORT 1 VIEW Result Date: 04/15/2024 CLINICAL DATA:  142230 Pleural effusion 142230 EXAM: PORTABLE CHEST - 1 VIEW COMPARISON:  04/13/2024 FINDINGS: 5 right arm PICC to the cavoatrial junction. Possibly low lung volumes with some increase in perihilar patchy opacities. Heart size and mediastinal contours are within normal limits. No definite effusion. Visualized bones unremarkable. IMPRESSION: Low lung volumes with some increase in perihilar patchy opacities. Electronically Signed   By: JONETTA Faes M.D.   On: 04/15/2024 08:01   ECHOCARDIOGRAM COMPLETE Result Date: 04/14/2024    ECHOCARDIOGRAM REPORT   Patient Name:   MATTHEWJAMES PETRASEK Date of Exam: 04/14/2024 Medical Rec #:  [id number]          Height:       71.0 in Accession #:    [id number]         Weight:       240.0 lb Date of Birth:  30-May-1951          BSA:          2.278 m Patient Age:    72 years           BP:           122/56 mmHg Patient Gender: M                  HR:           110 bpm. Exam Location:  Inpatient Procedure: 2D Echo, Intracardiac Opacification Agent, Cardiac Doppler and Color            Doppler (Both Spectral and Color Flow Doppler were utilized during            procedure). Indications:    Abnormal ECG R94.31  History:        Patient has no prior history of Echocardiogram examinations.                 Abnormal ECG,  Arrythmias:Tachycardia;  Risk Factors:Sleep Apnea                 and Former Smoker.  Sonographer:    Koleen Popper RDCS Referring Phys: [id number] DAVID MANUEL ORTIZ  Sonographer Comments: Technically challenging study due to limited acoustic windows. Image acquisition challenging due to uncooperative patient and Image acquisition challenging due to respiratory motion. IMPRESSIONS  1. Left ventricular ejection fraction, by estimation, is 65 to 70%. The left ventricle has hyperdynamic function. The left ventricle has no regional wall motion abnormalities. Left ventricular diastolic parameters are consistent with Grade I diastolic dysfunction (impaired relaxation).  2. Right ventricular systolic function is normal. The right ventricular size is not well visualized. There is normal pulmonary artery systolic pressure. The estimated right ventricular systolic pressure is 29.4 mmHg.  3. Left atrial size was mildly dilated.  4. The mitral valve is grossly normal. No evidence of mitral valve regurgitation. No evidence of mitral stenosis.  5. The aortic valve is tricuspid. There is mild calcification of the aortic valve. There is mild thickening of the aortic valve. Aortic valve regurgitation is not visualized. Aortic valve sclerosis/calcification is present, without any evidence of aortic stenosis. FINDINGS  Left Ventricle: Left ventricular ejection fraction, by estimation, is 65 to 70%. The left ventricle has hyperdynamic function. The left ventricle has no regional wall motion abnormalities. Definity  contrast agent was given IV to delineate the left ventricular endocardial borders. The left ventricular internal cavity size was normal in size. There is borderline concentric left ventricular hypertrophy. Left ventricular diastolic parameters are consistent with Grade I diastolic dysfunction (impaired relaxation). Normal left ventricular filling pressure. Right Ventricle: The right ventricular size is not well visualized.  Right vetricular wall thickness was not well visualized. Right ventricular systolic function is normal. There is normal pulmonary artery systolic pressure. The tricuspid regurgitant velocity is 2.47 m/s, and with an assumed right atrial pressure of 5 mmHg, the estimated right ventricular systolic pressure is 29.4 mmHg. Left Atrium: Left atrial size was mildly dilated. Right Atrium: Right atrial size was not well visualized. Pericardium: There is no evidence of pericardial effusion. Mitral Valve: The mitral valve is grossly normal. No evidence of mitral valve regurgitation. No evidence of mitral valve stenosis. Tricuspid Valve: The tricuspid valve is grossly normal. Tricuspid valve regurgitation is mild. Aortic Valve: The aortic valve is tricuspid. There is mild calcification of the aortic valve. There is mild thickening of the aortic valve. Aortic valve regurgitation is not visualized. Aortic valve sclerosis/calcification is present, without any evidence of aortic stenosis. Pulmonic Valve: The pulmonic valve was not well visualized. Pulmonic valve regurgitation is not visualized. No evidence of pulmonic stenosis. Aorta: The aortic root and ascending aorta are structurally normal, with no evidence of dilitation. Venous: The inferior vena cava was not well visualized. IAS/Shunts: The interatrial septum was not well visualized.  LEFT VENTRICLE PLAX 2D LVIDd:         4.50 cm   Diastology LVIDs:         3.30 cm   LV e' medial:    7.00 cm/s LV PW:         1.30 cm   LV E/e' medial:  10.3 LV IVS:        1.20 cm   LV e' lateral:   7.00 cm/s LVOT diam:     2.00 cm   LV E/e' lateral: 10.3 LV SV:         61 LV SV Index:   27 LVOT Area:  3.14 cm  RIGHT VENTRICLE RV S prime:     15.30 cm/s TAPSE (M-mode): 1.2 cm LEFT ATRIUM             Index LA diam:        4.16 cm 1.83 cm/m LA Vol (A2C):   35.9 ml 15.76 ml/m LA Vol (A4C):   39.1 ml 17.16 ml/m LA Biplane Vol: 37.3 ml 16.37 ml/m  AORTIC VALVE LVOT Vmax:   135.00 cm/s LVOT  Vmean:  90.000 cm/s LVOT VTI:    0.193 m  AORTA Ao Root diam: 3.30 cm Ao Asc diam:  3.50 cm MITRAL VALVE                TRICUSPID VALVE MV Area (PHT): 4.96 cm     TR Peak grad:   24.4 mmHg MV Decel Time: 153 msec     TR Vmax:        247.00 cm/s MV E velocity: 72.20 cm/s MV A velocity: 106.00 cm/s  SHUNTS MV E/A ratio:  0.68         Systemic VTI:  0.19 m                             Systemic Diam: 2.00 cm Jerel Croitoru MD Electronically signed by Jerel Balding MD Signature Date/Time: 04/14/2024/2:55:48 PM    Final    CT ABDOMEN PELVIS W CONTRAST Result Date: 04/14/2024 CLINICAL DATA:  Abdominal pain. Status post hernia repair. Rule out bowel perforation. EXAM: CT ABDOMEN AND PELVIS WITH CONTRAST TECHNIQUE: Multidetector CT imaging of the abdomen and pelvis was performed using the standard protocol following bolus administration of intravenous contrast. RADIATION DOSE REDUCTION: This exam was performed according to the departmental dose-optimization program which includes automated exposure control, adjustment of the mA and/or kV according to patient size and/or use of iterative reconstruction technique. CONTRAST:  75mL OMNIPAQUE  IOHEXOL  350 MG/ML SOLN COMPARISON:  10/14/2020 FINDINGS: Lower chest: Small right pleural effusion with overlying atelectasis. There is also trace left pleural of fluid with mild subsegmental atelectasis. Hepatobiliary: No focal liver abnormality is seen. Status post cholecystectomy. No biliary dilatation. Pancreas: Diffuse pancreatic atrophy with scattered calcifications. No main duct dilatation or inflammation. Spleen: Normal in size without focal abnormality. Adrenals/Urinary Tract: Normal adrenal glands. 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 Left renal atrophy with severe hydronephrosis and hydroureter is identified which appears similar to the previous exam. New moderate right hydronephrosis and right hydroureter. Stomach/Bowel: There is a nasogastric tube  with tip in the proximal stomach. The proximal small bowel loops are increased in caliber containing air-fluid levels measuring up to 4.3 cm. Gradual transition to decreased caliber mid and distal small bowel begins in the left lower quadrant, axial image 71/2 and coronal image 32/4. Small amount of a dilute contrast passes beyond this area without significant contrast material noted in the distal small bowel loops or colon. There is soft tissue stranding within the right lower quadrant of the abdomen. High attenuation material is identified scratch set there is also high density fluid noted within the right lower quadrant of the abdomen adjacent to the small bowel anastomosis, image 40/4 and image 65/2. Small bowel anastomosis is identified within the right lower quadrant of the abdomen. Just distal to the anastomosis there is a small defect identified within the wall loop of ileum with adjacent extraluminal gas, coronal image 41/4, sagittal image 29/5 and axial image 75/2. Vascular/Lymphatic: Aortic atherosclerosis. No  aneurysm. No signs of abdominopelvic adenopathy. Reproductive: Status post prostatectomy. Other: Status post repair the midline umbilical hernia noted on the previous exam. There is widening of the subcutaneous soft tissues containing gas overlying the midline hernia repair which may reflect at least partial dehiscence. There are also postsurgical changes associated with repair of the large right lower quadrant parastomal hernia with persistent marked laxity of the right lower quadrant abdominal wall. A surgical drainage catheter enters from a left abdominal approach with tip terminating in the right hemipelvis. There is also a surgical drainage catheter which enters from a right lateral approach and terminates around the right lower quadrant herniorrhaphy site. Asymmetric edema with overlying skin thickening and subcutaneous soft tissue stranding is noted extending along right flank. There is  thickening of the right lateral abdominal wall musculature. This may reflect edema. Underlying intramuscular hematoma not excluded. Small volume of pneumoperitoneum is noted. Gas identified along the left lateral abdominal wall and right lower quadrant abdominal wall compatible with postoperative change. Small fluid collection along the left paramidline lower anterior abdominal wall measures 6.1 x 1.4 cm, image 82/3. Musculoskeletal: No acute or significant osseous findings. No acute or suspicious osseous findings. Unchanged superior endplate deformity at the L1 level which likely represents a Schmorl's node. IMPRESSION: 1. Right lower quadrant small bowel loop with mural defect and adjacent extraluminal gas identified, which is concerning for underlying small bowel perforation. 2. Increase caliber of the proximal small bowel loops with gradual transition to decreased caliber mid and distal small bowel begins in the left lower quadrant. Small amount of a dilute contrast passes beyond this area without significant contrast material noted in the distal small bowel loops or colon. Findings are concerning for small bowel ileus. Obstruction less favored but not excluded. 3. Status post repair of the midline umbilical hernia noted on the previous exam. There is widening of overlying subcutaneous soft tissues with gas. Correlate for any signs or symptoms of incisional dehiscence. 4. There are also postsurgical changes associated with repair of the large right lower quadrant parastomal hernia with persistent marked laxity of the right lower quadrant abdominal wall. 5. 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 Left renal atrophy with severe hydronephrosis and hydroureter is identified which appears similar to the previous exam. New moderate right hydronephrosis and right hydroureter. 6. Small right pleural effusion with overlying atelectasis. There is also trace left pleural of fluid with mild  subsegmental atelectasis. 7. Postoperative changes involving the right lower quadrant abdominal wall with gas and edema. There is thickening of the right lateral abdominal wall musculature which may reflect underlying hematoma. 8.  Aortic Atherosclerosis (ICD10-I70.0). Critical Value/emergent results were called by telephone at the time of interpretation on 04/14/2024 at 8:10 am to provider [facility] , who verbally acknowledged these results. Electronically Signed   By: Waddell Calk M.D.   On: 04/14/2024 08:10   CT Angio Chest Pulmonary Embolism (PE) W or WO Contrast Result Date: 04/14/2024 EXAM: CTA of the Chest with contrast for PE 04/14/2024 04:12:39 AM TECHNIQUE: CTA of the chest was performed after the administration of intravenous contrast. Multiplanar reformatted images are provided for review. MIP images are provided for review. Automated exposure control, iterative reconstruction, and/or weight based adjustment of the mA/kV was utilized to reduce the radiation dose to as low as reasonably achievable. COMPARISON: CT chest 12/06/2023 at [facility] urology specialists. CLINICAL HISTORY: Pulmonary embolism (PE) suspected, low to intermediate prob, positive D-dimer. Post-op pain, SOB, eval for perf; Bladder  cancer. FINDINGS: PULMONARY ARTERIES: Pulmonary arteries are adequately opacified for evaluation. No pulmonary embolism. Main pulmonary artery is normal in caliber. MEDIASTINUM: The heart and pericardium demonstrate no acute abnormality. There is no acute abnormality of the thoracic aorta. Atherosclerotic changes are again seen within the coronary arteries. LYMPH NODES: Previously noted right hilar nodularity is less well seen on today's study and may be improved. LUNGS AND PLEURA: No new or enlarging pulmonary nodules are present. A moderate right-sided pleural effusion is present with partial collapse of the right lower lobe. UPPER ABDOMEN: Gastric tube is in place. SOFT TISSUES AND BONES: Fused anterior  osteophytes are present throughout the thoracic spine. No focal osseous lesions are present. IMPRESSION: 1. No evidence of pulmonary embolism. 2. Moderate right-sided pleural effusion with partial collapse of the right lower lobe. Electronically signed by: Lonni Necessary MD 04/14/2024 04:26 AM EDT RP Workstation: [id number]    Labs:  CBC: Recent Labs    05/10/24 0600 05/11/24 0500 05/12/24 0412 05/13/24 0500  WBC 11.3* 9.4 9.3 11.0*  HGB 10.5* 10.9* 11.4* 12.1*  HCT 32.8* 34.2* 36.2* 37.4*  PLT 131* 145* 147* 191    COAGS: Recent Labs    04/13/24 1047 04/14/24 1141 04/23/24 0416 04/24/24 0338 04/24/24 0601 05/04/24 1020  INR 1.6* 1.6*  --   --   --  1.1  APTT 47*  --  37* 189* 167*  --     BMP: Recent Labs    05/11/24 0000 05/11/24 0500 05/12/24 0412 05/13/24 0500  NA 133* 132* 132* 131*  K 4.4 4.9 4.3 4.5  CL 100 99 99 101  CO2 20* 19* 20* 16*  GLUCOSE 147* 183* 157* 166*  BUN 96* 95* 105* 107*  CALCIUM  9.9 9.8 9.8 10.3  CREATININE 2.52* 2.61* 2.60* 2.26*  GFRNONAA 27* 25* 26* 30*    LIVER FUNCTION TESTS: Recent Labs    04/23/24 0416 04/23/24 1600 04/26/24 0644 04/27/24 0453 04/28/24 0441 05/11/24 0000 05/11/24 0500 05/12/24 0412 05/13/24 0500  BILITOT 0.7  --  0.9 0.6  --  0.7  --   --   --   AST 21  --  25 20  --  34  --   --   --   ALT 13  --  33 29  --  58*  --   --   --   ALKPHOS 46  --  58 58  --  93  --   --   --   PROT 4.9*  --  5.6* 5.5*  --  7.0  --   --   --   ALBUMIN  1.8*  1.7*   < > 2.0* 1.8*  1.8*   < > 3.0* 3.0* 3.0* 3.1*   < > = values in this interval not displayed.    TUMOR MARKERS: No results for input(s): AFPTM, CEA, CA199, CHROMGRNA in the last 8760 hours.  Assessment and Plan:  -CT A/P reviewed by Dr. Renda and Dr. KANDICE Banner -Plan for right nephroureteral exchange with possible stenting -WBC minimally increased to 11.0 this morning -Receiving tube feeds until ~8am this morning; will plan for procedure  this afternoon -Heparin  drip to be held 2hrs prior  -Continue IV antibiotics per primary team   Risks and benefits of PCN placement was discussed with the patient including, but not limited to, infection, bleeding, significant bleeding causing loss or decrease in renal function or damage to adjacent structures.   All of the patient's questions were answered, patient is  agreeable to proceed.  Consent signed and in chart.  Thank you for this interesting consult. I greatly enjoyed meeting Kristopher Thompson and look forward to participating in their care. A copy of this report was sent to the requesting provider on this date.  Electronically Signed: Glennon CHRISTELLA Bal, PA-C 05/13/2024, 9:36 AM   I spent a total of 40 Minutes in face to face clinical consultation, greater than 50% of which was counseling/coordinating care for right nephroureteral catheter exchange with possible stenting.

## 2024-05-13 NOTE — TOC Progression Note (Signed)
 Transition of Care [facility]) - Progression Note    Patient Details  Name: GUMARO BRIGHTBILL MRN: [id number] Date of Birth: 1951-03-08  Transition of Care [facility]) CM/SW Contact  Montie LOISE Louder, [place] Phone Number: 05/13/2024, 4:57 PM  Clinical Narrative:     TOC continues to follow for medical readiness  [facility] will assist with d/c planning to [facility] once stable for transfer.   Montie Louder, MSW, LCSW Clinical Social Worker    Expected Discharge Plan: Skilled Nursing Facility Barriers to Discharge: Continued Medical Work up  Expected Discharge Plan and Services In-house Referral: Clinical Social Work Discharge Planning Services: CM Consult Post Acute Care Choice: Skilled Nursing Facility Living arrangements for the past 2 months: Single Family Home                                       Social Determinants of Health (SDOH) Interventions SDOH Screenings   Food Insecurity: No Food Insecurity (04/12/2024)  Housing: Low Risk  (04/12/2024)  Transportation Needs: No Transportation Needs (04/12/2024)  Utilities: Not At Risk (04/12/2024)  Social Connections: Socially Integrated (04/10/2024)  Tobacco Use: Medium Risk (04/10/2024)    Readmission Risk Interventions    04/12/2024    4:58 PM  Readmission Risk Prevention Plan  Transportation Screening Complete  PCP or Specialist Appt within 3-5 Days Complete  HRI or Home Care Consult Complete  Social Work Consult for Recovery Care Planning/Counseling Complete  Palliative Care Screening Not Applicable  Medication Review Oceanographer) Complete

## 2024-05-13 NOTE — Progress Notes (Signed)
   05/13/24 2043  BiPAP/CPAP/SIPAP  BiPAP/CPAP/SIPAP Pt Type Adult (Pt places himself on home unit. RT will assist as needed.)  Patient Home Machine Yes  Safety Check Completed by RT for Home Unit Yes, no issues noted  Patient Home Mask Yes  Device Plugged into RED Power Outlet Yes  BiPAP/CPAP /SiPAP Vitals  Pulse Rate (!) 108  SpO2 97 %  MEWS Score/Color  MEWS Score 1  MEWS Score Color Green

## 2024-05-13 NOTE — Progress Notes (Signed)
 Patient ID: Kristopher Thompson, male   DOB: [birth year], 73 y.o.   MRN: [id number]  73 y.o. yo male  with  hypertension, DM, CAD, hx bladder cancer, CHF, right bundle branch block who had acute bowel perforation and status post OR for bowel repair on 6/8, lysis of adhesion, ileal resection end ileostomy hernia repairs, septic shock seen for AKI and oliguria.   Assessment/ Plan:   # Acute kidney injury, oliguric with anasarca likely ischemic ATN due to septic shock and obstructive uropathy given bilateral hydronephrosis.  CRRT 6/11-6/14 -  Because of the patient's persistent altered mental status and high metabolic state with elevated BUN was put back on CRRT on 6/17- 6/20 -  then taken off- with rising BUN and crt .  Had HD 05/04/24 without UF or complications.  His UOP and BUN/Cr had continued to improve without HD to a nadir Cr of 1.94 on 6/26 but since that time creatinine in the 2.3-2.6 range with BUN slowly rising to 105 at time of reconsultation. Temp cath removed on 6/25.  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  Renal function slowly improving with the better urine output.  Discussed with spouse who was bedside and also patient, they would both like to hold off on dialysis unless absolutely necessary.  # Bilateral hydronephrosis severe on the left side: S/p revision of ileal conduit 5/30,  urology following ->  s/p bilateral percutaneous nephrostomy tube placement by IR on 05/04/24 to divert urine.   # Acute bowel perforation is status post bowel repair ileal conduit and hernia repairs urostomy ileal conduit  revision.  As per surgical team.    # HTN: Continue current medications and adjust volume status with dialysis as tolerated   # Acute hypoxic respiratory failure/VDRF per pulmonary team. Extubated 6/14, currently on nasal cannula   # Altered mental status: Likely multifactorial.  Uremia may have played some role.  Improving slowly -  a little more somnolent today - uremia may have something to do with it    # Anemia-  added ESA -  iron  stores OK   # Hyperkalemia-  given lokelma  previously - and follow after HD and PCN placement, now resolved  S: No new complaints O:BP 115/83 (BP Location: Left Arm)   Pulse (!) 107   Temp 97.6 F (36.4 C) (Axillary)   Resp 17   Ht 5' 11 (1.803 m)   Wt 99.3 kg   SpO2 99%   BMI 30.53 kg/m   Intake/Output Summary (Last 24 hours) at 05/13/2024 1023 Last data filed at 05/13/2024 0549 Gross per 24 hour  Intake --  Output 3470 ml  Net -3470 ml   Intake/Output: I/O last 3 completed shifts: In: 4117.9 [I.V.:178.7; NG/GT:3827.3; IV Piggyback:111.9] Out: 5060 [Urine:3270; Drains:550; Stool:1240]  Intake/Output this shift:  No intake/output data recorded. Weight change:  Gen: NAD CVS: RRR Resp:CTA Abd: +BS, soft, ostomy in RLQ, wound vac in place Ext: no edema  Recent Labs  Lab 05/07/24 0415 05/08/24 0901 05/09/24 0830 05/10/24 0600 05/11/24 0000 05/11/24 0500 05/12/24  9587 05/13/24 0500  NA 135 133* 132* 132* 133* 132* 132* 131*  K 4.2 4.5 4.6 4.8 4.4 4.9 4.3 4.5  CL 103 99 98 100 100 99 99 101  CO2 21* 21* 20* 18* 20* 19* 20* 16*  GLUCOSE 143* 105* 139* 100* 147* 183* 157* 166*  BUN 74* 90* 89* 89* 96* 95* 105* 107*  CREATININE 1.94* 2.56* 2.84* 2.29* 2.52* 2.61* 2.60* 2.26*  ALBUMIN  2.6*  --  2.6* 2.9* 3.0* 3.0* 3.0* 3.1*  CALCIUM  9.0 9.4 9.2 9.7 9.9 9.8 9.8 10.3  PHOS 3.4  --  4.7* 4.7*  --  1.2* 6.9* 5.8*  AST  --   --   --   --  34  --   --   --   ALT  --   --   --   --  58*  --   --   --    Liver Function Tests: Recent Labs   Lab 05/11/24 0000 05/11/24 0500 05/12/24 0412 05/13/24 0500  AST 34  --   --   --   ALT 58*  --   --   --   ALKPHOS 93  --   --   --   BILITOT 0.7  --   --   --   PROT 7.0  --   --   --   ALBUMIN  3.0* 3.0* 3.0* 3.1*   No results for input(s): LIPASE, AMYLASE in the last 168 hours. No results for input(s): AMMONIA in the last 168 hours. CBC: Recent Labs  Lab 05/09/24 1351 05/10/24 0600 05/11/24 0500 05/12/24 0412 05/13/24 0500  WBC 11.5* 11.3* 9.4 9.3 11.0*  HGB 10.2* 10.5* 10.9* 11.4* 12.1*  HCT 31.5* 32.8* 34.2* 36.2* 37.4*  MCV 91.3 94.3 92.4 93.5 92.6  PLT 124* 131* 145* 147* 191   Cardiac Enzymes: No results for input(s): CKTOTAL, CKMB, CKMBINDEX, TROPONINI in the last 168 hours. CBG: Recent Labs  Lab 05/12/24 1516 05/12/24 1910 05/12/24 2302 05/13/24 0315 05/13/24 0758  GLUCAP 144* 101* 170* 203* 103*    Iron  Studies: No results for input(s): IRON , TIBC, TRANSFERRIN, FERRITIN in the last 72 hours. Studies/Results: DG Loopogram Result Date: 05/12/2024 CLINICAL DATA:  [id number] Complication of Ileal conduit [facility]) [id number] Patient with ileal conduit, s/p recent nephrostogram with Dr. Luverne at time of nephrostomy exchange. Concern for conduit leak due to presence of urine in surgical drains. Team has requested introduction of contrast into the conduit to be followed by CT imaging. EXAM: WATER  SOLUBLE CONTRAST LOOPOGRAM TECHNIQUE: The urostomy bag was removed as his current device did not allow entry from port used to empty the collection bag. The skin was cleansed and a scout image was obtained. An 8 Fr Foley was inflated outside of the urostomy. The tip was inserted and the balloon used to gently occlude the stoma. Omni 300 was introduced through the catheter. This technique was not adequate to prevent leakage. After discussion with Dr. Hughes, the 8 Fr Foley was introduced several cm into the stoma and balloon inflated with 5 mL saline within the  stoma. Contrast was then introduced without leakage. Approximately 70 mL of water  soluble contrast ws introduce to the system; at that time, leakage around the stoma was noted and instillation was stopped. Fluoroscopy images were obtained throughout the procedure. The balloon was completely deflated and removed from the stoma. FLUOROSCOPY: Radiation Exposure Index and estimated peak skin dose (PSD); Reference air kerma (RAK), 6.5 mGy. Kerma-area product (KAP), 217.5 uGy*m.  COMPARISON:  05/11/24 Nephrostogram FINDINGS: 1. Contrast introduced via catheter is seen within the ileal conduit and retrograde opacification of the RIGHT renal collecting system. 2. The LEFT renal collecting system did not opacify. Patient to immediately transfer to CT imaging area for CT AP for further evaluation. IMPRESSION: 1. Contrast filling within the ileal conduit and retrograde opacification of the RIGHT renal collecting system. 2. The LEFT renal collecting system did not opacify. Electronically Signed   By: Thom Hall M.D.   On: 05/12/2024 16:33   CT ABDOMEN PELVIS WO CONTRAST Result Date: 05/12/2024 CLINICAL DATA:  Status post prior cystectomy with ileal conduit formation. Recent surgery for bowel perforation. Suspected urine leak near the level ileal conduit due to surgical drain fluid return demonstrating elevated creatinine. EXAM: CT ABDOMEN AND PELVIS WITHOUT CONTRAST TECHNIQUE: Multidetector CT imaging of the abdomen and pelvis was performed following the standard protocol without IV contrast. Prior to the CT study, contrast was injected via a right lower quadrant urostomy into the ileal conduit RADIATION DOSE REDUCTION: This exam was performed according to the departmental dose-optimization program which includes automated exposure control, adjustment of the mA and/or kV according to patient size and/or use of iterative reconstruction technique. COMPARISON:  Recent CT studies on 05/03/2024 and 04/19/2024 FINDINGS: Lower chest:  No acute abnormality. Hepatobiliary: No focal liver abnormality is seen. Status post cholecystectomy. No biliary dilatation. Pancreas: Unremarkable. No pancreatic ductal dilatation or surrounding inflammatory changes. Spleen: Normal in size without focal abnormality. Adrenals/Urinary Tract: Bilateral percutaneous nephrostomy tubes are present with pigtail portions formed in the renal pelvis bilaterally. There is some distension of the right renal collecting system with contrast due to free reflux of contrast injected into the ileal conduit with contrast opacification of the entire right ureter and renal collecting system. 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 This stenosis was characterized by nephrostogram yesterday. Status post cystectomy. Stomach/Bowel: Ileal conduit is well distended with injected contrast. The urostomy itself appears focally stenotic just before the abdominal wall within the subcutaneous fat. There then is another segment of stenosis right at the abdominal wall and extending just into the peritoneal cavity where there may be a very subtle pinpoint leak extending laterally over approximately 2 cm and paralleling just superior to one of the surgical drains that originates in the fall left lateral abdomen and ultimately crosses over into the far right lateral lower abdomen. There is an additional more obvious leak along the inferior aspect of the conduit on axial images 75-83 and also seen well on coronal reconstructions beginning as a thin column of extravasated injected contrast measuring approximately 4-5 mm in width and extending into the right lower abdominal wall at the level of one of the transversely oriented surgical drains. No evidence of bowel obstruction, ileus or free intraperitoneal air. Feeding tube extends into the mid stomach. Vascular/Lymphatic: No significant  vascular findings are present. No enlarged abdominal or pelvic lymph nodes. Reproductive: No significant findings. Other: Edema of the right lower muscle wall and some ill-defined fluid in the right lower abdominal wall which appears stable since the prior CT. Musculoskeletal: No acute or significant osseous findings. IMPRESSION: 1. 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 There then is another segment of stenosis right at the abdominal wall and extending just into the peritoneal cavity  where there may be a very subtle pinpoint leak extending laterally over approximately 2 cm and paralleling just superior to one of the surgical drains that originates in the fall left lateral abdomen and ultimately crosses over into the far right lateral lower abdomen. 2. There is an additional more obvious leak along the inferior aspect of the conduit beginning as a thin column of extravasated injected contrast measuring approximately 4-5 mm in width and extending into the right lower abdominal wall at the level of one of the transversely oriented surgical drains. 3. Bilateral percutaneous nephrostomy tubes are present with pigtail portions formed in the renal pelvis bilaterally. There is some distension of the right renal collecting system with contrast due to free reflux of contrast injected into the ileal conduit with contrast opacification of the entire right ureter and renal collecting system. 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 This stenosis was characterized by nephrostogram yesterday. 4. Edema of the right lower muscle wall and some ill-defined fluid in the right lower abdominal wall which appears stable since the prior CT. Electronically Signed   By: Marcey Moan M.D.   On: 05/12/2024 14:32    IR NEPHROSTOMY EXCHANGE BILATERAL Result Date: 05/11/2024 INDICATION: Status post prior cystectomy with ileal conduit formation. Recent bowel surgery for bowel perforation. Concern for possible urine leak. EXAM: 1. LEFT PERCUTANEOUS NEPHROSTOMY TUBE EXCHANGE UNDER FLUOROSCOPY INCLUDING FULL ANTEGRADE NEPHROSTOGRAM 2. RIGHT PERCUTANEOUS NEPHROSTOMY TUBE EXCHANGE UNDER FLUOROSCOPY INCLUDING FULL ANTEGRADE NEPHROSTOGRAM COMPARISON:  None Available. MEDICATIONS: None ANESTHESIA/SEDATION: None CONTRAST:  50 mL Omnipaque  300-administered into the collecting system(s) FLUOROSCOPY: Radiation Exposure Index (as provided by the fluoroscopic device): 111 mGy Kerma COMPLICATIONS: None immediate. PROCEDURE: Informed written consent was obtained from the patient after a thorough discussion of the procedural risks, benefits and alternatives. All questions were addressed. Maximal Sterile Barrier Technique was utilized including caps, mask, sterile gowns, sterile gloves, sterile drape, hand hygiene and skin antiseptic. A timeout was performed prior to the initiation of the procedure. Both percutaneous nephrostomy tubes were injected with contrast material under fluoroscopy and multiple fluoroscopy loops and images were saved. The left percutaneous nephrostomy tube was then removed over a guidewire and exchanged for a 5 French catheter over a wire. The catheter was further advanced into the left ureter and additional nephrostogram performed to assess ureteral patency. A new 10 French percutaneous nephrostomy tube was then formed over a guidewire in the left renal pelvis A new 10 French right percutaneous nephrostomy tube was then exchanged over a guidewire and formed in the renal pelvis. Both nephrostomy tubes were secured at the skin with Prolene retention sutures, adhesive StatLock devices and attached to new gravity drainage bags. FINDINGS: Injection of the left percutaneous nephrostomy tube demonstrates retraction of the  tube which is peripherally located in a lower pole calyx. Contrast injection demonstrates left-sided hydronephrosis and poor drainage into the ileal conduit with suggestion of a distal ureteral stricture. This was confirmed by advancement of a 5 French catheter into the distal ureter with contrast injection demonstrating a high-grade stricture but flow of contrast through the stricture and into the ileal conduit once a catheter was advanced near the level of the stricture. There is no evidence of left-sided urine leak. The left nephrostomy tube was replaced with a new 10 French catheter formed at the level of the renal pelvis. A right nephrostogram demonstrates retraction the right nephrostomy  tube into a lower pole calyx. Contrast injection demonstrates good flow of contrast into the right ureter and ileal conduit without evidence of ureteral stricture or urine leak. The right nephrostomy tube was replaced with a new 10 French catheter formed at the level of the renal pelvis. IMPRESSION: 1. 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 Injection of contrast at the level of the distal ureter does show flow through the stricture and into the ileal conduit. No left-sided urine leak demonstrated. 2. Right nephrostogram demonstrates normally patent ureter and ureteral anastomosis with the ileal conduit. No right-sided urine leak demonstrated. 3. Both nephrostomy tubes had retracted into lower pole calices and were replaced with new 10 French catheters formed at the level of the renal pelvis bilaterally. Electronically Signed   By: Marcey Moan M.D.   On: 05/11/2024 12:35    Chlorhexidine  Gluconate Cloth  6 each Topical Daily   vitamin B-12  1,000 mcg Oral Daily   darbepoetin (ARANESP ) injection - DIALYSIS  200 mcg  Subcutaneous Q Fri-1800   feeding supplement  237 mL Oral BID BM   feeding supplement (PROSource TF20)  60 mL Per Tube BID   insulin  aspart  0-20 Units Subcutaneous Q4H   insulin  glargine-yfgn  25 Units Subcutaneous QHS   melatonin  5 mg Oral QHS   metoCLOPramide   5 mg Oral TID AC & HS   multivitamin with minerals  1 tablet Oral Daily   nutrition supplement (JUVEN)  1 packet Oral BID BM   mouth rinse  15 mL Mouth Rinse 4 times per day   polycarbophil  625 mg Oral BID   prochlorperazine   10 mg Intravenous Once   rosuvastatin   10 mg Oral QPM   sevelamer  carbonate  800 mg Oral TID WC   sodium chloride  flush  5 mL Intracatheter Q8H    BMET    Component Value Date/Time   NA 131 (L) 05/13/2024 0500   K 4.5 05/13/2024 0500   CL 101 05/13/2024 0500   CO2 16 (L) 05/13/2024 0500   GLUCOSE 166 (H) 05/13/2024 0500   BUN 107 (H) 05/13/2024 0500   CREATININE 2.26 (H) 05/13/2024 0500   CALCIUM  10.3 05/13/2024 0500   GFRNONAA 30 (L) 05/13/2024 0500   GFRAA >60 12/09/2018 0549   CBC    Component Value Date/Time   WBC 11.0 (H) 05/13/2024 0500   RBC 4.04 (L) 05/13/2024 0500   HGB 12.1 (L) 05/13/2024 0500   HCT 37.4 (L) 05/13/2024 0500   PLT 191 05/13/2024 0500   MCV 92.6 05/13/2024 0500   MCH 30.0 05/13/2024 0500   MCHC 32.4 05/13/2024 0500   RDW 19.2 (H) 05/13/2024 0500   LYMPHSABS 0.8 04/21/2024 1715   MONOABS 0.9 04/21/2024 1715   EOSABS 0.0 04/21/2024 1715   BASOSABS 0.1 04/21/2024 1715

## 2024-05-13 NOTE — Plan of Care (Signed)
  Problem: Education: Goal: Knowledge of General Education information will improve Description: Including pain rating scale, medication(s)/side effects and non-pharmacologic comfort measures Outcome: Progressing   Problem: Clinical Measurements: Goal: Respiratory complications will improve Outcome: Progressing   Problem: Nutrition: Goal: Adequate nutrition will be maintained Outcome: Progressing   Problem: Pain Managment: Goal: General experience of comfort will improve and/or be controlled Outcome: Progressing   Problem: Safety: Goal: Ability to remain free from injury will improve Outcome: Progressing

## 2024-05-14 DIAGNOSIS — K631 Perforation of intestine (nontraumatic): Secondary | ICD-10-CM | POA: Diagnosis not present

## 2024-05-14 LAB — RENAL FUNCTION PANEL
Albumin: 3.1 g/dL — ABNORMAL LOW (ref 3.5–5.0)
Anion gap: 14 (ref 5–15)
BUN: 90 mg/dL — ABNORMAL HIGH (ref 8–23)
CO2: 16 mmol/L — ABNORMAL LOW (ref 22–32)
Calcium: 10 mg/dL (ref 8.9–10.3)
Chloride: 101 mmol/L (ref 98–111)
Creatinine, Ser: 2.15 mg/dL — ABNORMAL HIGH (ref 0.61–1.24)
GFR, Estimated: 32 mL/min — ABNORMAL LOW (ref >60)
Glucose, Bld: 116 mg/dL — ABNORMAL HIGH (ref 70–99)
Phosphorus: 5.7 mg/dL — ABNORMAL HIGH (ref 2.5–4.6)
Potassium: 4.2 mmol/L (ref 3.5–5.1)
Sodium: 131 mmol/L — ABNORMAL LOW (ref 135–145)

## 2024-05-14 LAB — CBC
HCT: 37.1 % — ABNORMAL LOW (ref 39.0–52.0)
Hemoglobin: 11.7 g/dL — ABNORMAL LOW (ref 13.0–17.0)
MCH: 29.6 pg (ref 26.0–34.0)
MCHC: 31.5 g/dL (ref 30.0–36.0)
MCV: 93.9 fL (ref 80.0–100.0)
Platelets: 196 10*3/uL (ref 150–400)
RBC: 3.95 MIL/uL — ABNORMAL LOW (ref 4.22–5.81)
RDW: 19.1 % — ABNORMAL HIGH (ref 11.5–15.5)
WBC: 11 10*3/uL — ABNORMAL HIGH (ref 4.0–10.5)
nRBC: 0 % (ref 0.0–0.2)

## 2024-05-14 LAB — GLUCOSE, CAPILLARY
Glucose-Capillary: 110 mg/dL — ABNORMAL HIGH (ref 70–99)
Glucose-Capillary: 130 mg/dL — ABNORMAL HIGH (ref 70–99)
Glucose-Capillary: 136 mg/dL — ABNORMAL HIGH (ref 70–99)
Glucose-Capillary: 126 mg/dL — ABNORMAL HIGH (ref 70–99)
Glucose-Capillary: 109 mg/dL — ABNORMAL HIGH (ref 70–99)
Glucose-Capillary: 99 mg/dL (ref 70–99)

## 2024-05-14 LAB — HEPARIN LEVEL (UNFRACTIONATED): Heparin Unfractionated: 0.15 [IU]/mL — ABNORMAL LOW (ref 0.30–0.70)

## 2024-05-14 MED ORDER — DARBEPOETIN ALFA 60 MCG/0.3ML IJ SOSY: 60.0000 ug | PREFILLED_SYRINGE | INTRAMUSCULAR | Status: DC

## 2024-05-14 MED ORDER — APIXABAN 5 MG PO TABS
5.0000 mg | ORAL_TABLET | Freq: Two times a day (BID) | ORAL | Status: DC
  Administered 2024-05-14 – 2024-05-22 (×17): 5 mg via ORAL
  Filled 2024-05-14 (×17): qty 1

## 2024-05-14 MED ORDER — PROCHLORPERAZINE EDISYLATE 10 MG/2ML IJ SOLN
10.0000 mg | Freq: Four times a day (QID) | INTRAMUSCULAR | Status: DC | PRN
  Administered 2024-05-20 – 2024-05-22 (×2): 10 mg via INTRAVENOUS
  Filled 2024-05-14 (×2): qty 2

## 2024-05-14 NOTE — Consult Note (Signed)
 WOC Nurse wound follow up Wound type: surgical  Measurement: 17 cm x 5 cm x 2 cm   Wound bed: red, moist, granulation tissue visible throughout  Drainage (amount, consistency, odor) serosanguinous in cannister Periwound: intact  Dressing procedure/placement/frequency: Removed old NPWT dressing  Filled wound with   __3__ piece of black foam  Sealed NPWT dressing at HG Patient tolerated procedure well  WOC nurse will continue to provide NPWT dressing changed due to the complexity of the dressing change.   WOC Nurse ostomy follow up Stoma type/location:  RLQ ileal conduit, RUQ ileostomy  Stomal assessment/size: see prior note dated 6/25 Peristomal assessment: appliance in place, unable to visualize.  Patient reports both ostomy sites appliances were changed during the night,  current pouches at clean and intact, will not change at this time. Treatment options for stomal/peristomal skin: 2 inch barrier ring Output  Clear urine Liquid brown stool Ostomy pouching: 1pc soft convex urostomy and ileostomy pouch.  Education provided: None at this time, patient and spouse are knowledgeable about ostomy care. Enrolled patient in [place] Secure Start Discharge program: Yes  WOC team will continue to provide NPWT dressing changed due to the complexity of the dressing change and ostomy teaching as needed.   Doyal Polite, RN, MSN, [facility] WOC Team

## 2024-05-14 NOTE — Progress Notes (Signed)
 [facility] NEPHROLOGY PROGRESS NOTE  Assessment/ Plan: Pt is a 73 y.o. yo male  with hypertension, DM, CAD, hx bladder cancer, CHF, right bundle branch block who had acute bowel perforation and status post OR for bowel repair on 6/8, lysis of adhesion, ileal resection end ileostomy hernia repairs, septic shock seen for AKI and oliguria.   # Acute kidney injury, oliguric with anasarca likely ischemic ATN due to septic shock and obstructive uropathy given bilateral hydronephrosis. CRRT 6/11-6/14 and then on 6/17- 6/20 followed by IHD.  After replacing percutaneous nephrostomy tube patient has increased urine output.  The temporary HD line was removed on 6/25. The patient is now nonoliguric and creatinine level continue to improve.  No sign or symptoms of uremia.  No plan for HD. Continue daily lab and strict ins and outs.  #  Bilateral hydronephrosis severe on the left side: S/p revision of ileal conduit 5/30, urology following -> s/p bilateral percutaneous nephrostomy tube placement by IR on 05/04/24 to divert urine.   #Acute bowel perforation is status post bowel repair ileal conduit and hernia repairs urostomy ileal conduit revision. As per surgical team.   # Anemia: Hemoglobin at goal.  Lower dose of erythropoietin.  # HTN/volume: Monitor BP, volume looks acceptable.  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  Follow lab.  Subjective: Seen and examined.  Urine output recorded around 1.5 L.  Denies nausea, vomiting, chest pain or shortness of breath.  Family members at the bedside. Objective Vital signs in last 24 hours: Vitals:   05/13/24 1942 05/13/24 2043 05/13/24 2204 05/14/24 0233  BP:   91/64   Pulse:  (!) 108 (!) 112   Resp:      Temp: 97.7 F (36.5 C)  97.7 F (36.5 C) 98 F (36.7 C)  TempSrc: Oral  Oral Oral  SpO2:  97% 98%   Weight:      Height:       Weight change:   Intake/Output Summary (Last 24 hours) at 05/14/2024 1333 Last data filed at  05/14/2024 1153 Gross per 24 hour  Intake 880.34 ml  Output 2700 ml  Net -1819.66 ml       Labs: RENAL PANEL Recent Labs  Lab 05/10/24 0600 05/11/24 0000 05/11/24 0500 05/12/24 0412 05/13/24 0500 05/14/24 0315  NA 132* 133* 132* 132* 131* 131*  K 4.8 4.4 4.9 4.3 4.5 4.2  CL 100 100 99 99 101 101  CO2 18* 20* 19* 20* 16* 16*  GLUCOSE 100* 147* 183* 157* 166* 116*  BUN 89* 96* 95* 105* 107* 90*  CREATININE 2.29* 2.52* 2.61* 2.60* 2.26* 2.15*  CALCIUM  9.7 9.9 9.8 9.8 10.3 10.0  MG  --   --   --   --  2.3  --   PHOS 4.7*  --  1.2* 6.9* 5.8*  5.8* 5.7*  ALBUMIN  2.9* 3.0* 3.0* 3.0* 3.1* 3.1*    Liver Function Tests: Recent Labs  Lab 05/11/24 0000 05/11/24 0500 05/12/24 0412 05/13/24 0500 05/14/24 0315  AST 34  --   --   --   --   ALT 58*  --   --   --   --   ALKPHOS 93  --   --   --   --   BILITOT 0.7  --   --   --   --   PROT 7.0  --   --   --   --   ALBUMIN  3.0*   < > 3.0* 3.1*  3.1*   < > = values in this interval not displayed.   No results for input(s): LIPASE, AMYLASE in the last 168 hours. No results for input(s): AMMONIA in the last 168 hours. CBC: Recent Labs    05/02/24 0515 05/04/24 0341 05/10/24 0600 05/11/24 0500 05/12/24 0412 05/13/24 0500 05/14/24 0315  HGB  --    < > 10.5* 10.9* 11.4* 12.1* 11.7*  MCV  --    < > 94.3 92.4 93.5 92.6 93.9  FERRITIN 588*  --   --   --   --   --   --   TIBC 281  --   --   --   --   --   --   IRON  54  --   --   --   --   --   --    < > = values in this interval not displayed.    Cardiac Enzymes: No results for input(s): CKTOTAL, CKMB, CKMBINDEX, TROPONINI in the last 168 hours. CBG: Recent Labs  Lab 05/13/24 1948 05/13/24 2203 05/14/24 0236 05/14/24 0754 05/14/24 1140  GLUCAP 117* 114* 110* 130* 136*    Iron  Studies: No results for input(s): IRON , TIBC, TRANSFERRIN, FERRITIN in the last 72 hours. Studies/Results: IR URETERAL STENT RIGHT NEW ACCESS W/SEP NEPHROSTOMY  CATH Result Date: 05/14/2024 INDICATION: Patient with a history of a radical cystectomy/ileal conduit urinary diversion with a known leak of the ileal conduit. Planned retrograde placement of a ureteral stent and replacement of the percutaneous nephrostomy tube for urinary diversion away from the leak. EXAM: Nephrostomy tube exchange, antegrade ureterogram with fluoroscopy, new placement retrograde ureteral stent COMPARISON:  None Available. MEDICATIONS: None; The antibiotic was administered in an appropriate time frame prior to skin puncture. ANESTHESIA/SEDATION: Moderate (conscious) sedation was employed during this procedure. A total of Versed  4 mg and Fentanyl  200 mcg was administered intravenously by the radiology nurse. Total intra-service moderate Sedation Time: 44 minutes. The patient's level of consciousness and vital signs were monitored continuously by radiology nursing throughout the procedure under my direct supervision. CONTRAST:  35 mL Omnipaque  300-administered into the collecting system(s) FLUOROSCOPY: Radiation Exposure Index (as provided by the fluoroscopic device): 21 minutes 12 seconds 3,401 mGy Kerma COMPLICATIONS: None immediate. PROCEDURE: Informed written consent was obtained from the patient after a thorough discussion of the procedural risks, benefits and alternatives. All questions were addressed. Maximal Sterile Barrier Technique was utilized including caps, mask, sterile gowns, sterile gloves, sterile drape, hand hygiene and skin antiseptic. A timeout was performed prior to the initiation of the procedure. With patient in a left lateral decubitus position the right lower quadrant and right flank were prepped and draped in the usual sterile fashion. Dilute contrast was injected into the pre-existing percutaneous nephrostomy tube in order to perform a pyelogram the and visualized anatomy as well as identifying location of the catheter. Once this was established, retention suture and  sterile dressing were removed the catheter was cut. An 018 and an 035 guidewire were advanced under fluoroscopy into the renal pelvis. The catheter was then removed over a guidewire. The 018 safety wire was then clamped external to the patient in order to lock its position. An angled glide catheter was then advanced over the guidewire and placed in the renal pelvis. And antegrade ureterogram was then performed by injecting the catheter in the renal pelvis and evaluating the right ureter. The ureter is dilated and the add leak near the anastomosis is identified. Using standard catheter and  guidewire technique, the catheter and guidewire were advanced in antegrade fashion down the ureter past the leak and into the ileal conduit. Contrast was then injected into the ileum in order to identify anatomy and further promote advancing the catheter under video x-ray. Using a combination of AP and lateral imaging, fluoroscopy was then done to continue to advance the guidewire and catheter through the ileum and out the ostomy. Once the guidewire was identified out the ostium it was then removed in order to have further purchase at the ostomy. The catheter was then advanced over the guidewire and out the patient through the ostomy. The guidewire was then removed leaving the catheter in position and a new guidewire was placed retrograde through the ureter from the ostomy. This was done under video x-rays and monitor for when the guidewire was identified at the renal pelvis. A 10 French pigtail catheter was then advanced after the 035 catheter was removed, over the guidewire, in retrograde fashion, and coiled in the renal pelvis there by placing a retrograde ureteral stent. The locking pigtail was engaged and the catheter was left in the ostomy. The safety wire was then used to advance a new 10 [place] percutaneous nephrostomy tube over the guidewire and back in percutaneously to the renal pelvis. The catheter was then coiled in  position and retention suture was placed. The percutaneous nephrostomy tube was then connected to gravity drainage. IMPRESSION: Fluoroscopic guided replacement of percutaneous nephrostomy tube as well as antegrade pyelogram and contrast injection for the ureter on the right side and subsequent placement of a new retrograde 10 French ureteral stent. The patient will return for routine exchanges of both catheters to be scheduled for a later date. Electronically Signed   By: Cordella Banner   On: 05/14/2024 09:35   IR URETERAL STENT PLACEMENT EXISTING ACCESS LEFT Result Date: 05/14/2024 INDICATION: Patient with a history of a radical cystectomy/ileal conduit urinary diversion with a known leak of the ileal conduit. Planned retrograde placement of a ureteral stent and replacement of the percutaneous nephrostomy tube for urinary diversion away from the leak. EXAM: Nephrostomy tube exchange, antegrade ureterogram with fluoroscopy, new placement retrograde ureteral stent COMPARISON:  None Available. MEDICATIONS: None; The antibiotic was administered in an appropriate time frame prior to skin puncture. ANESTHESIA/SEDATION: Moderate (conscious) sedation was employed during this procedure. A total of Versed  4 mg and Fentanyl  200 mcg was administered intravenously by the radiology nurse. Total intra-service moderate Sedation Time: 44 minutes. The patient's level of consciousness and vital signs were monitored continuously by radiology nursing throughout the procedure under my direct supervision. CONTRAST:  35 mL Omnipaque  300-administered into the collecting system(s) FLUOROSCOPY: Radiation Exposure Index (as provided by the fluoroscopic device): 21 minutes 12 seconds 3,401 mGy Kerma COMPLICATIONS: None immediate. PROCEDURE: Informed written consent was obtained from the patient after a thorough discussion of the procedural risks, benefits and alternatives. All questions were addressed. Maximal Sterile Barrier Technique was  utilized including caps, mask, sterile gowns, sterile gloves, sterile drape, hand hygiene and skin antiseptic. A timeout was performed prior to the initiation of the procedure. With patient in a left lateral decubitus position the right lower quadrant and right flank were prepped and draped in the usual sterile fashion. Dilute contrast was injected into the pre-existing percutaneous nephrostomy tube in order to perform a pyelogram the and visualized anatomy as well as identifying location of the catheter. Once this was established, retention suture and sterile dressing were removed the catheter was cut. An 018  and an 035 guidewire were advanced under fluoroscopy into the renal pelvis. The catheter was then removed over a guidewire. The 018 safety wire was then clamped external to the patient in order to lock its position. An angled glide catheter was then advanced over the guidewire and placed in the renal pelvis. And antegrade ureterogram was then performed by injecting the catheter in the renal pelvis and evaluating the right ureter. The ureter is dilated and the add leak near the anastomosis is identified. Using standard catheter and guidewire technique, the catheter and guidewire were advanced in antegrade fashion down the ureter past the leak and into the ileal conduit. Contrast was then injected into the ileum in order to identify anatomy and further promote advancing the catheter under video x-ray. Using a combination of AP and lateral imaging, fluoroscopy was then done to continue to advance the guidewire and catheter through the ileum and out the ostomy. Once the guidewire was identified out the ostium it was then removed in order to have further purchase at the ostomy. The catheter was then advanced over the guidewire and out the patient through the ostomy. The guidewire was then removed leaving the catheter in position and a new guidewire was placed retrograde through the ureter from the ostomy. This was  done under video x-rays and monitor for when the guidewire was identified at the renal pelvis. A 10 French pigtail catheter was then advanced after the 035 catheter was removed, over the guidewire, in retrograde fashion, and coiled in the renal pelvis there by placing a retrograde ureteral stent. The locking pigtail was engaged and the catheter was left in the ostomy. The safety wire was then used to advance a new 10 [place] percutaneous nephrostomy tube over the guidewire and back in percutaneously to the renal pelvis. The catheter was then coiled in position and retention suture was placed. The percutaneous nephrostomy tube was then connected to gravity drainage. IMPRESSION: Fluoroscopic guided replacement of percutaneous nephrostomy tube as well as antegrade pyelogram and contrast injection for the ureter on the right side and subsequent placement of a new retrograde 10 French ureteral stent. The patient will return for routine exchanges of both catheters to be scheduled for a later date. Electronically Signed   By: Cordella Banner   On: 05/14/2024 09:35    Medications: Infusions:  sodium chloride  Stopped (05/01/24 0014)   albumin  human     anticoagulant sodium citrate      micafungin  (MYCAMINE ) 100 mg in sodium chloride  0.9 % 100 mL IVPB 100 mg (05/14/24 0939)   piperacillin -tazobactam (ZOSYN )  IV 12.5 mL/hr at 05/14/24 0753    Scheduled Medications:  apixaban  5 mg Oral BID   Chlorhexidine  Gluconate Cloth  6 each Topical Daily   vitamin B-12  1,000 mcg Oral Daily   darbepoetin (ARANESP ) injection - DIALYSIS  200 mcg Subcutaneous Q Fri-1800   feeding supplement  237 mL Oral BID BM   feeding supplement (PROSource TF20)  60 mL Per Tube BID   insulin  aspart  0-20 Units Subcutaneous Q4H   insulin  glargine-yfgn  25 Units Subcutaneous QHS   melatonin  5 mg Oral QHS   metoCLOPramide   5 mg Oral TID AC & HS   multivitamin with minerals  1 tablet Oral Daily   nutrition supplement (JUVEN)  1 packet  Oral BID BM   mouth rinse  15 mL Mouth Rinse 4 times per day   polycarbophil  625 mg Oral BID   rosuvastatin   10 mg  Oral QPM   sevelamer  carbonate  800 mg Oral TID WC   sodium chloride  flush  5 mL Intracatheter Q8H    have reviewed scheduled and prn medications.  Physical Exam: General:NAD, comfortable Heart:RRR, s1s2 nl Lungs:clear b/l, no crackle Abdomen:soft, nephrostomy tube in place Extremities:No edema Dialysis Access: No HD line.  Antoinne Spadaccini Prasad Fergie Sherbert 05/14/2024,1:33 PM  LOS: 34 days

## 2024-05-14 NOTE — Progress Notes (Signed)
 05/14/2024  Kristopher Thompson [id number] 10-Sep-1951  CARE TEAM: PCP: Kristopher Ingle, MD  Outpatient Care Team: Patient Care Team: Kristopher Ingle, MD as PCP - General (Internal Medicine) Kristopher Rigg, MD (Inactive) as Consulting Physician (Dermatology) Kristopher Bud, MD as Referring Physician (Pulmonary Disease) Kristopher Standing, MD as Consulting Physician (General Surgery) Kristopher Glance, MD as Consulting Physician (Urology)  Inpatient Treatment Team: Treatment Team:  [place], [name], DO Kristopher Thompson., MD Kristopher Glance, MD Kristopher Delaine SAUNDERS, RN Kristopher Shiner, MD Kristopher Kristopher Radiology, MD Kristopher Pac, MD Kristopher Aran, MD Kristopher Standing, MD Kristopher Lynwood ORN, MD Kristopher Jayson PARAS, MD Kristopher Sarah, MD Kristopher Caren FORBES, RN Kristopher Ronal PARAS, RN Kristopher Thompson, NT Sasser, Kristopher Thompson, PT   Problem List:   Principal Problem:   Delayed bowel perforation s/p SBR/end ileostomy Active Problems:   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 08/08/2017   S/P ileal conduit (HCC)   GERD (gastroesophageal reflux disease)   Obesity (BMI 35.0-39.9 without comorbidity)   Prolonged QT interval   Non-recurrent bilateral inguinal hernia without obstruction or gangrene   Incarcerated incisional hernia   Sinus tachycardia   Tachypnea   Acute respiratory insufficiency, postoperative   Sepsis due to undetermined organism (HCC)   Lactic acidosis   Class 2 obesity   Chronic anticoagulation   Hearing loss   History of bladder cancer   Obstructive sleep apnea of adult   Parastomal hernia of ileal conduit   Partial small bowel obstruction (HCC)   Personal history of PE (pulmonary embolism)   Pressure injury of skin   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   04/10/2024  POST-OPERATIVE DIAGNOSIS:  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   PROCEDURE:   -ROBOTIC LYSIS OF ADHESIONS X 4  HOURS -COMPONENT SEPARATION - TRANSVERSUS ABDOMINIS REALEASE (TAR) BILATERAL -ROBOTIC REPAIRS OF  INCISIONAL, PARASTOMAL, LEFT INGUINAL  INCARCERATED ABDOMINAL WALL HERNIAS WITH MESH -UROSTOMY ILEAL CONDUIT REVISION -INTRAOPERATIVE ASSESSMENT OF TISSUE VASCULAR PERFUSION USING ICG (indocyanine green ) -IMMUNOFLUORESCENCE -TRANSVERSUS ABDOMINIS PLANE (TAP) BLOCK - BILATERAL    SURGEON:  Thompson KYM Sheldon, MD   04/20/2024 POST-OPERATIVE DIAGNOSIS:  perforated bowel   PROCEDURE:  Drainage of right abdominal wall abscess as well as intra-abdominal abscess Explantation of abdominal mesh Small bowel resection (in discontinuity) Placement of ABThera wound VAC   SURGEON:  Kristopher Locus, MD  04/21/2024  POST-OPERATIVE DIAGNOSIS:  DELAYED BOWEL PERFORATION WITH OPEN ABDOMEN   PROCEDURE:   LYSIS OF ADHESIONS X ABDOMINAL WALL DEBRIDEMENT ILEAL RESECTION END ILEOSTOMY ABDOMINAL WALL PARASTOMAL & INCISIONAL HERNIA REPAIRS WITH PHASIX MESH FASCIA CLOSURE WITH WOUND VAC PLACEMENT IN SQ   SURGEON:  Thompson KYM Sheldon, MD    05/04/2024 Interventional Thompson Procedure Note   Procedure: Bilateral percutaneous nephrostomy tube placements   Findings: Please refer to procedural dictation for full description. 10 Fr bilateral to bag drainage.   Complications: None immediate   Estimated Blood Loss: < 5 ml   Recommendations: Keep to bag drainage.  IR will follow.  Ultimate management as per Urology.   Assessment  -Incarcerated parastomal incisional hernias with obstructive symptoms status post robotic takedown and repair -Delayed small bowel perforation s/p ileal resection and end ileostomy -Delayed urine leak of urostomy ileal conduit status post perc nephrostomy tubes -Multisystem organ failure gradually resolving  SLOWLY IMPROVING   Plan:  Delayed urine leak with AKI:  Urinary diversion   Internal:  with percutaneous nephrostomy tubes.  Adjusted 6/30, 7/1, 7/3.  Full report  not done yet but I believe they were able to place a stent from the kidneys across the ureteral ileal anastomoses and out of the conduit.  External: drainage with 19 French surgical Blake drain in the abdominal wall & peritoneum.  Per urology's request, placing it to gravity bag instead of bulb suction.  I was able to find a biliary bag with Ttube adapter that that better fit the Blake drains at bedside 7/3.  If not controlled with urinary diversion and stenting, he may require revision of his ileal conduit.  That would be very high risk.  Understand they wish to hold off but do not want to have nephrostomy tubes indefinitely either if possible.  Defer to Dr. Renda urology with interventional Thompson   Still uremic but nonoliguric.  Nephrology saw again 7/1 and are hoping that his uremia will resolve as the urinary strictures and leaks are better palliated with stenting and draining.  No dialysis for now   Nutrition:  Patient's nausea lessened.  Better last night.  I discussed with patient & wife.  He wishes a trial of removal of the feeding tube in the hopes that he can enough on his own.  Since his nutritional numbers are improved (albumin  3, prealbumin 40) reasonable to see if that will suceed.  I removed corpak 7/2 AM  Diet as tolerated.  Nephrology recommends renal diet.  Eating better - follow  Bowel regimen for ileostomy.  soluble polyfiber/FiberCon to thicken with nightly   Weaned off TPN 6/16   Hold off on G-tube unless poor PO effort >14days  Infection due to delayed bowel perforation and abdominal wall infection/necrosis  Growing multiple organisms including Candida albicans, Enterobacter, and Enterococcus.    IV Zosyn /micafungin .  Could consider switching to Bactrim /fluconazole  but concern for QT interval on the fluconazole .  Continue IV for now  Anticipate prolonged antibiotics given the abdominal wall infection/necrosis and need for Phasix mesh.   Change wound vac q  MonThu in the hopes for the subcutaneous tissues to recover.  Last change 6/25 showing early granulation without necrosis guardedly reassuring.      Metabolic encephalopathy resolved.  Follow.  Insomnia improved  ABLA on top of anemia of chronic disease improved with transfusion.  Follow.    -monitor electrolytes & replace as needed.  Keep K>4, Mg>2, Phos>3.    -Diabetes.  Sliding scale insulin .    -VTE prophylaxis-  Full anticoagulation given history of pulmonary embolism.  With procedures, switched to Eliquis DOAC p.o. and stop heparin .    -Mobilize as tolerated.  Starting to work with physical and Occupational Therapy more.  He tends to refuse to get out of bed.  External encouraged him to get out of bed to rehab more.    I updated the patient's status to the the patient & ICU RN in room.  Discussed with Dr. Annella with critical care.  Recommendations were made.  Questions were answered.  They expressed understanding & appreciation.  -Disposition: Possible transition to inpatient rehab if makes goals.  I would hold off on LTAC.     I reviewed nursing notes, last 24 h vitals and pain scores, last 48 h intake and output, last 24 h labs and trends, and last 24 h imaging results.  I have reviewed this patient's available data, including medical history, events of note, test results, etc as part of my evaluation.   A significant portion of that time was spent in counseling. Care during the described time  interval was provided by me.  This care required high  level of medical decision making.  05/14/2024    Subjective: (Chief complaint)  He went for catheter adjustment on his nephrostomy tube and his stent now coming out of the ileal conduit.  Eating better.  Denies much pain.  Declined physical therapy yesterday..  Nursing just outside room.    Objective:  Vital signs:  Vitals:   05/13/24 1942 05/13/24 2043 05/13/24 2204 05/14/24 0233  BP:   91/64   Pulse:  (!) 108  (!) 112   Resp:      Temp: 97.7 F (36.5 C)  97.7 F (36.5 C) 98 F (36.7 C)  TempSrc: Oral  Oral Oral  SpO2:  97% 98%   Weight:      Height:        Last BM Date : 05/13/24  Intake/Output   Yesterday:  07/02 0701 - 07/03 0700 In: -  Out: 2100 [Urine:1525; Stool:575] This shift:  Total I/O In: -  Out: 950 [Urine:650; Stool:300]  Bowel function:  Flatus: YES  BM:  YES  Drains:  RLQ drain (rests between abdominal wall and Phasix mesh): Serous low volume.  LLQ drain (runs over bowel with tip down in RLQ pelvis near base of ileal conduit and site of bowel resection): Serous   Nephrostomy tubes in place.  - light yellow-colored  Physical Exam:  General: Resting in no acute distress.  Legs crossed looking relaxed.  Mentally back to baseline.  Clemens sometime after therapy questions likely sometimes slow to answer but no major confusion  Eyes: PERRL, normal EOM.  Sclera clear.  No icterus Neuro: CN II-XII intact w/o focal sensory/motor deficits. Lymph: No head/neck/groin lymphadenopathy Psych:  No delerium/psychosis/paranoia.  No agitation HENT: Normocephalic, Mucus membranes moist.  No thrush. Mildly hard of hearing Neck: Supple, No tracheal deviation.  No obvious thyromegaly Chest: No pain to chest wall compression.  Good respiratory excursion.   CV:  Pulses intact.  Regular rhythm.  1-2+ BUE/BLE edema MS:  No obvious deformity  Abdomen:  Obese Soft.  Nondistended.  Nontender Wound vac (in SQ over closed fascia) in midline.    RUQ: (End ileostomy): Pink mucosa.  Brown oatmeal thick effluent in bag RLQ:  (Urostomy ileal conduit): Pink mucosa without any obvious separation.  Moderate clear light yellow-colored urine.  Stent out ostomy  Ext:   No deformity.  No edema.  No cyanosis Skin: No petechiae / purpurea.  No major sores.  Warm and dry    Results:   Cultures: Recent Results (from the past 720 hours)  Culture, blood (Routine X 2) w Reflex to ID Panel      Status: None   Collection Time: 04/20/24  8:46 AM   Specimen: BLOOD RIGHT ARM  Result Value Ref Range Status   Specimen Description   Final    BLOOD RIGHT ARM Performed at [facility], [address] [place], [place] [address]    Special Requests   Final    BOTTLES DRAWN AEROBIC AND ANAEROBIC Blood Culture results may not be optimal due to an inadequate volume of blood received in culture bottles Performed at [facility], [address], [place], [place] [address]    Culture   Final    NO GROWTH 5 DAYS Performed at [facility], [address], [place], [place] [address]    Report Status 04/25/2024 FINAL  Final  Culture, blood (Routine X 2) w Reflex to  ID Panel     Status: None   Collection Time: 04/20/24  8:49 AM   Specimen: BLOOD  Result Value Ref Range Status   Specimen Description   Final    BLOOD Performed at [facility], [address], [place], [place] [address]    Special Requests   Final    BOTTLES DRAWN AEROBIC AND ANAEROBIC Blood Culture results may not be optimal due to an inadequate volume of blood received in culture bottles Performed at [facility], [address], [place], [place] [address]    Culture   Final    NO GROWTH 5 DAYS Performed at [facility], [address] [place], [place] [address]    Report Status 04/25/2024 FINAL  Final  Culture, Respiratory w Gram Stain     Status: None   Collection Time: 04/20/24 12:03 PM   Specimen: Tracheal Aspirate; Respiratory  Result Value Ref Range Status   Specimen Description   Final    TRACHEAL ASPIRATE Performed at [facility], [address], [place], [place] [address]    Special Requests   Final    NONE Performed at [facility], [address], [place], [place] [address]    Gram Stain NO WBC SEEN RARE GRAM POSITIVE COCCI   Final   Culture   Final    RARE Normal respiratory flora-no Staph aureus or  Pseudomonas seen Performed at [facility], [address] [place], [place] [address]    Report Status 04/23/2024 FINAL  Final  Aerobic/Anaerobic Culture w Gram Stain (surgical/deep wound)     Status: None   Collection Time: 04/21/24  2:23 PM   Specimen: Soft Tissue, Other  Result Value Ref Range Status   Specimen Description   Final    TISSUE INTRA ABDOMINAL NECROTIC TISSUE Performed at [facility], [address], [place], [place] [address]    Special Requests   Final    NONE Performed at [facility], [address] [place], [place] [address]    Gram Stain   Final    FEW WBC PRESENT,BOTH PMN AND MONONUCLEAR RARE GRAM POSITIVE COCCI IN PAIRS AND CHAINS RARE YEAST    Culture   Final    RARE ESCHERICHIA COLI FEW CANDIDA ALBICANS FEW ENTEROCOCCUS FAECALIS FEW BACTEROIDES SPECIES BETA LACTAMASE POSITIVE Performed at [facility], [address], [place], [place] [address]    Report Status 04/25/2024 FINAL  Final   Organism ID, Bacteria ESCHERICHIA COLI  Final   Organism ID, Bacteria ENTEROCOCCUS FAECALIS  Final      Susceptibility   Escherichia coli - MIC*    AMPICILLIN >=32 RESISTANT Resistant     CEFEPIME  <=0.12 SENSITIVE Sensitive     CEFTAZIDIME <=1 SENSITIVE Sensitive     CEFTRIAXONE  1 SENSITIVE Sensitive     CIPROFLOXACIN  <=0.25 SENSITIVE Sensitive     GENTAMICIN  <=1 SENSITIVE Sensitive     IMIPENEM <=0.25 SENSITIVE Sensitive     TRIMETH /SULFA  <=20 SENSITIVE Sensitive     AMPICILLIN/SULBACTAM >=32 RESISTANT Resistant     PIP/TAZO 8 SENSITIVE Sensitive ug/mL    * RARE ESCHERICHIA COLI   Enterococcus faecalis - MIC*    AMPICILLIN <=2 SENSITIVE Sensitive     VANCOMYCIN  1 SENSITIVE Sensitive     GENTAMICIN  SYNERGY SENSITIVE Sensitive     * FEW ENTEROCOCCUS FAECALIS    Labs: Results for orders placed or performed during the hospital encounter of  04/10/24 (from the past 48 hours)  Glucose, capillary     Status:  Abnormal   Collection Time: 05/12/24  7:55 AM  Result Value Ref Range   Glucose-Capillary 130 (H) 70 - 99 mg/dL    Comment: Glucose reference range applies only to samples taken after fasting for at least 8 hours.  Glucose, capillary     Status: Abnormal   Collection Time: 05/12/24 11:55 AM  Result Value Ref Range   Glucose-Capillary 141 (H) 70 - 99 mg/dL    Comment: Glucose reference range applies only to samples taken after fasting for at least 8 hours.  Glucose, capillary     Status: Abnormal   Collection Time: 05/12/24  3:16 PM  Result Value Ref Range   Glucose-Capillary 144 (H) 70 - 99 mg/dL    Comment: Glucose reference range applies only to samples taken after fasting for at least 8 hours.  Glucose, capillary     Status: Abnormal   Collection Time: 05/12/24  7:10 PM  Result Value Ref Range   Glucose-Capillary 101 (H) 70 - 99 mg/dL    Comment: Glucose reference range applies only to samples taken after fasting for at least 8 hours.  Glucose, capillary     Status: Abnormal   Collection Time: 05/12/24 11:02 PM  Result Value Ref Range   Glucose-Capillary 170 (H) 70 - 99 mg/dL    Comment: Glucose reference range applies only to samples taken after fasting for at least 8 hours.  Glucose, capillary     Status: Abnormal   Collection Time: 05/13/24  3:15 AM  Result Value Ref Range   Glucose-Capillary 203 (H) 70 - 99 mg/dL    Comment: Glucose reference range applies only to samples taken after fasting for at least 8 hours.  Heparin  level (unfractionated)     Status: None   Collection Time: 05/13/24  5:00 AM  Result Value Ref Range   Heparin  Unfractionated 0.53 0.30 - 0.70 IU/mL    Comment: (NOTE) The clinical reportable range upper limit is being lowered to >1.10 to align with the FDA approved guidance for the current laboratory assay.  If heparin  results are below expected values, and patient dosage has  been confirmed, suggest follow up testing of antithrombin III  levels. Performed at [facility], [address], [place], [place] [address]   Renal function panel     Status: Abnormal   Collection Time: 05/13/24  5:00 AM  Result Value Ref Range   Sodium 131 (L) 135 - 145 mmol/L   Potassium 4.5 3.5 - 5.1 mmol/L   Chloride 101 98 - 111 mmol/L   CO2 16 (L) 22 - 32 mmol/L   Glucose, Bld 166 (H) 70 - 99 mg/dL    Comment: Glucose reference range applies only to samples taken after fasting for at least 8 hours.   BUN 107 (H) 8 - 23 mg/dL   Creatinine, Ser 7.73 (H) 0.61 - 1.24 mg/dL   Calcium  10.3 8.9 - 10.3 mg/dL   Phosphorus 5.8 (H) 2.5 - 4.6 mg/dL   Albumin  3.1 (L) 3.5 - 5.0 g/dL   GFR, Estimated 30 (L) >60 mL/min    Comment: (NOTE) Calculated using the CKD-EPI Creatinine Equation (2021)    Anion gap 14 5 - 15    Comment: Performed at [facility], [address], [place], [place] [address]  CBC     Status: Abnormal   Collection Time: 05/13/24  5:00 AM  Result Value Ref  Range   WBC 11.0 (H) 4.0 - 10.5 K/uL   RBC 4.04 (L) 4.22 - 5.81 MIL/uL   Hemoglobin 12.1 (L) 13.0 - 17.0 g/dL   HCT 62.5 (L) 60.9 - 47.9 %   MCV 92.6 80.0 - 100.0 fL   MCH 30.0 26.0 - 34.0 pg   MCHC 32.4 30.0 - 36.0 g/dL   RDW 80.7 (H) 88.4 - 84.4 %   Platelets 191 150 - 400 K/uL   nRBC 0.0 0.0 - 0.2 %    Comment: Performed at [facility], [address] [place], [place] [address]  Magnesium      Status: None   Collection Time: 05/13/24  5:00 AM  Result Value Ref Range   Magnesium  2.3 1.7 - 2.4 mg/dL    Comment: Performed at [facility], [address] [place], [place] [address]  Phosphorus     Status: Abnormal   Collection Time: 05/13/24  5:00 AM  Result Value Ref Range   Phosphorus 5.8 (H) 2.5 - 4.6 mg/dL    Comment: Performed at [facility], [address] [place], [place] [address]  Glucose, capillary     Status: Abnormal   Collection Time: 05/13/24  7:58 AM  Result Value Ref Range   Glucose-Capillary 103 (H) 70 - 99 mg/dL     Comment: Glucose reference range applies only to samples taken after fasting for at least 8 hours.  Glucose, capillary     Status: Abnormal   Collection Time: 05/13/24 11:26 AM  Result Value Ref Range   Glucose-Capillary 144 (H) 70 - 99 mg/dL    Comment: Glucose reference range applies only to samples taken after fasting for at least 8 hours.  Glucose, capillary     Status: Abnormal   Collection Time: 05/13/24  6:09 PM  Result Value Ref Range   Glucose-Capillary 111 (H) 70 - 99 mg/dL    Comment: Glucose reference range applies only to samples taken after fasting for at least 8 hours.  Glucose, capillary     Status: Abnormal   Collection Time: 05/13/24  7:48 PM  Result Value Ref Range   Glucose-Capillary 117 (H) 70 - 99 mg/dL    Comment: Glucose reference range applies only to samples taken after fasting for at least 8 hours.  Glucose, capillary     Status: Abnormal   Collection Time: 05/13/24 10:03 PM  Result Value Ref Range   Glucose-Capillary 114 (H) 70 - 99 mg/dL    Comment: Glucose reference range applies only to samples taken after fasting for at least 8 hours.  Glucose, capillary     Status: Abnormal   Collection Time: 05/14/24  2:36 AM  Result Value Ref Range   Glucose-Capillary 110 (H) 70 - 99 mg/dL    Comment: Glucose reference range applies only to samples taken after fasting for at least 8 hours.  Renal function panel     Status: Abnormal   Collection Time: 05/14/24  3:15 AM  Result Value Ref Range   Sodium 131 (L) 135 - 145 mmol/L   Potassium 4.2 3.5 - 5.1 mmol/L   Chloride 101 98 - 111 mmol/L   CO2 16 (L) 22 - 32 mmol/L   Glucose, Bld 116 (H) 70 - 99 mg/dL    Comment: Glucose reference range applies only to samples taken after fasting for at least 8 hours.   BUN 90 (H) 8 - 23 mg/dL   Creatinine, Ser 7.84 (H) 0.61 - 1.24 mg/dL  Calcium  10.0 8.9 - 10.3 mg/dL   Phosphorus 5.7 (H) 2.5 - 4.6 mg/dL   Albumin  3.1 (L) 3.5 - 5.0 g/dL   GFR, Estimated 32 (L) >60 mL/min     Comment: (NOTE) Calculated using the CKD-EPI Creatinine Equation (2021)    Anion gap 14 5 - 15    Comment: Performed at [facility], [address], [place], [place] [address]  CBC     Status: Abnormal   Collection Time: 05/14/24  3:15 AM  Result Value Ref Range   WBC 11.0 (H) 4.0 - 10.5 K/uL   RBC 3.95 (L) 4.22 - 5.81 MIL/uL   Hemoglobin 11.7 (L) 13.0 - 17.0 g/dL   HCT 62.8 (L) 60.9 - 47.9 %   MCV 93.9 80.0 - 100.0 fL   MCH 29.6 26.0 - 34.0 pg   MCHC 31.5 30.0 - 36.0 g/dL   RDW 80.8 (H) 88.4 - 84.4 %   Platelets 196 150 - 400 K/uL   nRBC 0.0 0.0 - 0.2 %    Comment: Performed at [facility], [address], [place], [place] [address]  Heparin  level (unfractionated)     Status: Abnormal   Collection Time: 05/14/24  3:15 AM  Result Value Ref Range   Heparin  Unfractionated 0.15 (L) 0.30 - 0.70 IU/mL    Comment: (NOTE) The clinical reportable range upper limit is being lowered to >1.10 to align with the FDA approved guidance for the current laboratory assay.  If heparin  results are below expected values, and patient dosage has  been confirmed, suggest follow up testing of antithrombin III levels. Performed at [facility], [address], [place], [place] [address]     Imaging / Studies: DG Loopogram Result Date: 05/12/2024 CLINICAL DATA:  [id number] Complication of Ileal conduit [facility]) [phone] Patient with ileal conduit, s/p recent nephrostogram with Dr. Luverne at time of nephrostomy exchange. Concern for conduit leak due to presence of urine in surgical drains. Team has requested introduction of contrast into the conduit to be followed by CT imaging. EXAM: WATER  SOLUBLE CONTRAST LOOPOGRAM TECHNIQUE: The urostomy bag was removed as his current device did not allow entry from port used to empty the collection bag. The skin was cleansed and a scout image was obtained. An 8 Fr Foley was inflated outside of the urostomy. The tip was inserted and the balloon used to gently  occlude the stoma. Omni 300 was introduced through the catheter. This technique was not adequate to prevent leakage. After discussion with Dr. Hughes, the 8 Fr Foley was introduced several cm into the stoma and balloon inflated with 5 mL saline within the stoma. Contrast was then introduced without leakage. Approximately 70 mL of water  soluble contrast ws introduce to the system; at that time, leakage around the stoma was noted and instillation was stopped. Fluoroscopy images were obtained throughout the procedure. The balloon was completely deflated and removed from the stoma. FLUOROSCOPY: Radiation Exposure Index and estimated peak skin dose (PSD); Reference air kerma (RAK), 6.5 mGy. Kerma-area product (KAP), 217.5 uGy*m. COMPARISON:  05/11/24 Nephrostogram FINDINGS: 1. Contrast introduced via catheter is seen within the ileal conduit and retrograde opacification of the RIGHT renal collecting system. 2. The LEFT renal collecting system did not opacify. Patient to immediately transfer to CT imaging area for CT AP for further evaluation. IMPRESSION: 1. Contrast filling within the ileal conduit and retrograde opacification of the RIGHT renal collecting system. 2. The LEFT renal collecting system did not opacify.  Electronically Signed   By: Thom Hall M.D.   On: 05/12/2024 16:33   CT ABDOMEN PELVIS WO CONTRAST Result Date: 05/12/2024 CLINICAL DATA:  Status post prior cystectomy with ileal conduit formation. Recent surgery for bowel perforation. Suspected urine leak near the level ileal conduit due to surgical drain fluid return demonstrating elevated creatinine. EXAM: CT ABDOMEN AND PELVIS WITHOUT CONTRAST TECHNIQUE: Multidetector CT imaging of the abdomen and pelvis was performed following the standard protocol without IV contrast. Prior to the CT study, contrast was injected via a right lower quadrant urostomy into the ileal conduit RADIATION DOSE REDUCTION: This exam was performed according to the departmental  dose-optimization program which includes automated exposure control, adjustment of the mA and/or kV according to patient size and/or use of iterative reconstruction technique. COMPARISON:  Recent CT studies on 05/03/2024 and 04/19/2024 FINDINGS: Lower chest: No acute abnormality. Hepatobiliary: No focal liver abnormality is seen. Status post cholecystectomy. No biliary dilatation. Pancreas: Unremarkable. No pancreatic ductal dilatation or surrounding inflammatory changes. Spleen: Normal in size without focal abnormality. Adrenals/Urinary Tract: Bilateral percutaneous nephrostomy tubes are present with pigtail portions formed in the renal pelvis bilaterally. There is some distension of the right renal collecting system with contrast due to free reflux of contrast injected into the ileal conduit with contrast opacification of the entire right ureter and renal collecting system. 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 This stenosis was characterized by nephrostogram yesterday. Status post cystectomy. Stomach/Bowel: Ileal conduit is well distended with injected contrast. The urostomy itself appears focally stenotic just before the abdominal wall within the subcutaneous fat. There then is another segment of stenosis right at the abdominal wall and extending just into the peritoneal cavity where there may be a very subtle pinpoint leak extending laterally over approximately 2 cm and paralleling just superior to one of the surgical drains that originates in the fall left lateral abdomen and ultimately crosses over into the far right lateral lower abdomen. There is an additional more obvious leak along the inferior aspect of the conduit on axial images 75-83 and also seen well on coronal reconstructions beginning as a thin column of extravasated injected contrast measuring approximately 4-5 mm in width and  extending into the right lower abdominal wall at the level of one of the transversely oriented surgical drains. No evidence of bowel obstruction, ileus or free intraperitoneal air. Feeding tube extends into the mid stomach. Vascular/Lymphatic: No significant vascular findings are present. No enlarged abdominal or pelvic lymph nodes. Reproductive: No significant findings. Other: Edema of the right lower muscle wall and some ill-defined fluid in the right lower abdominal wall which appears stable since the prior CT. Musculoskeletal: No acute or significant osseous findings. IMPRESSION: 1. 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 There then is another segment of stenosis right at the abdominal wall and extending just into the peritoneal cavity where there may be a very subtle pinpoint leak extending laterally over approximately 2 cm and paralleling just superior to one of the surgical drains that originates in the fall left lateral abdomen and ultimately crosses over into the far right lateral lower abdomen. 2. There is an additional more obvious leak along the inferior aspect of the conduit beginning as a thin column of extravasated injected contrast measuring approximately 4-5 mm in width  and extending into the right lower abdominal wall at the level of one of the transversely oriented surgical drains. 3. Bilateral percutaneous nephrostomy tubes are present with pigtail portions formed in the renal pelvis bilaterally. There is some distension of the right renal collecting system with contrast due to free reflux of contrast injected into the ileal conduit with contrast opacification of the entire right ureter and renal collecting system. 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 This stenosis  was characterized by nephrostogram yesterday. 4. Edema of the right lower muscle wall and some ill-defined fluid in the right lower abdominal wall which appears stable since the prior CT. Electronically Signed   By: Marcey Moan M.D.   On: 05/12/2024 14:32        Medications / Allergies: per chart  Antibiotics: Anti-infectives (From admission, onward)    Start     Dose/Rate Route Frequency Ordered Stop   05/06/24 1400  piperacillin -tazobactam (ZOSYN ) IVPB 3.375 g        3.375 g 12.5 mL/hr over 240 Minutes Intravenous Every 8 hours 05/06/24 1227     05/01/24 1400  piperacillin -tazobactam (ZOSYN ) IVPB 2.25 g  Status:  Discontinued        2.25 g 100 mL/hr over 30 Minutes Intravenous Every 8 hours 05/01/24 0957 05/06/24 1227   05/01/24 1045  micafungin  (MYCAMINE ) 100 mg in sodium chloride  0.9 % 100 mL IVPB        100 mg 105 mL/hr over 1 Hours Intravenous Every 24 hours 05/01/24 0949     04/29/24 1000  micafungin  (MYCAMINE ) 150 mg in sodium chloride  0.9 % 100 mL IVPB  Status:  Discontinued        150 mg 107.5 mL/hr over 1 Hours Intravenous Every 24 hours 04/28/24 1036 05/01/24 0949   04/28/24 1345  micafungin  (MYCAMINE ) 100 mg in sodium chloride  0.9 % 100 mL IVPB        100 mg 105 mL/hr over 1 Hours Intravenous  Once 04/28/24 1245 04/28/24 1354   04/28/24 1000  micafungin  (MYCAMINE ) 100 mg in sodium chloride  0.9 % 100 mL IVPB  Status:  Discontinued        100 mg 105 mL/hr over 1 Hours Intravenous Every 24 hours 04/27/24 1219 04/28/24 1036   04/21/24 1400  clindamycin  (CLEOCIN ) 900 mg, gentamicin  (GARAMYCIN ) 240 mg in sodium chloride  0.9 % 1,000 mL for intraperitoneal lavage  Status:  Discontinued         Irrigation To Surgery 04/21/24 1346 04/21/24 1618   04/20/24 1400  piperacillin -tazobactam (ZOSYN ) IVPB 3.375 g  Status:  Discontinued        3.375 g 12.5 mL/hr over 240 Minutes Intravenous Every 8 hours 04/20/24 0755 05/01/24 0957   04/20/24 1000  metroNIDAZOLE  (FLAGYL ) IVPB 500 mg   Status:  Discontinued        500 mg 100 mL/hr over 60 Minutes Intravenous Every 12 hours 04/20/24 0320 04/20/24 0752   04/20/24 1000  micafungin  (MYCAMINE ) 150 mg in sodium chloride  0.9 % 100 mL IVPB  Status:  Discontinued        150 mg 107.5 mL/hr over 1 Hours Intravenous Every 24 hours 04/20/24 0752 04/27/24 1219   04/20/24 0245  ceFEPIme  (MAXIPIME ) 2 g in sodium chloride  0.9 % 100 mL IVPB  Status:  Discontinued        2 g 200 mL/hr over 30 Minutes Intravenous  Every 8 hours 04/20/24 0232 04/20/24 0752   04/20/24 0100  metroNIDAZOLE  (FLAGYL ) IVPB 500 mg        500 mg 100 mL/hr over 60 Minutes Intravenous On call to O.R. 04/20/24 0004 04/20/24 0100   04/14/24 1800  ceFEPIme  (MAXIPIME ) 2 g in sodium chloride  0.9 % 100 mL IVPB        2 g 200 mL/hr over 30 Minutes Intravenous Every 8 hours 04/14/24 1003 04/19/24 0957   04/14/24 1600  vancomycin  (VANCOCIN ) IVPB 1000 mg/200 mL premix  Status:  Discontinued        1,000 mg 200 mL/hr over 60 Minutes Intravenous Every 24 hours 04/13/24 1420 04/14/24 1000   04/14/24 1600  vancomycin  (VANCOREADY) IVPB 1500 mg/300 mL  Status:  Discontinued        1,500 mg 150 mL/hr over 120 Minutes Intravenous Every 24 hours 04/14/24 1002 04/16/24 0744   04/14/24 1200  vancomycin  (VANCOCIN ) IVPB 1000 mg/200 mL premix  Status:  Discontinued        1,000 mg 200 mL/hr over 60 Minutes Intravenous Every 24 hours 04/13/24 1053 04/13/24 1109   04/13/24 2200  ceFEPIme  (MAXIPIME ) 2 g in sodium chloride  0.9 % 100 mL IVPB  Status:  Discontinued        2 g 200 mL/hr over 30 Minutes Intravenous Every 12 hours 04/13/24 1036 04/13/24 1109   04/13/24 2200  ceFEPIme  (MAXIPIME ) 2 g in sodium chloride  0.9 % 100 mL IVPB  Status:  Discontinued        2 g 200 mL/hr over 30 Minutes Intravenous Every 12 hours 04/13/24 1425 04/14/24 1003   04/13/24 1500  vancomycin  (VANCOCIN ) 2,000 mg in sodium chloride  0.9 % 500 mL IVPB        2,000 mg 260 mL/hr over 120 Minutes Intravenous  Once  04/13/24 1409 04/13/24 1850   04/13/24 1500  ceFEPIme  (MAXIPIME ) 2 g in sodium chloride  0.9 % 100 mL IVPB  Status:  Discontinued        2 g 200 mL/hr over 30 Minutes Intravenous Every 12 hours 04/13/24 1420 04/13/24 1425   04/13/24 1500  metroNIDAZOLE  (FLAGYL ) IVPB 500 mg  Status:  Discontinued        500 mg 100 mL/hr over 60 Minutes Intravenous Every 12 hours 04/13/24 1420 04/17/24 0725   04/13/24 1200  vancomycin  (VANCOCIN ) 2,000 mg in sodium chloride  0.9 % 500 mL IVPB  Status:  Discontinued        2,000 mg 260 mL/hr over 120 Minutes Intravenous  Once 04/13/24 1052 04/13/24 1121   04/13/24 1100  metroNIDAZOLE  (FLAGYL ) IVPB 500 mg  Status:  Discontinued        500 mg 100 mL/hr over 60 Minutes Intravenous 2 times daily 04/13/24 1007 04/13/24 1109   04/13/24 1100  vancomycin  (VANCOCIN ) IVPB 1000 mg/200 mL premix  Status:  Discontinued        1,000 mg 200 mL/hr over 60 Minutes Intravenous  Once 04/13/24 1007 04/13/24 1020   04/13/24 1015  ceFEPIme  (MAXIPIME ) 2 g in sodium chloride  0.9 % 100 mL IVPB        2 g 200 mL/hr over 30 Minutes Intravenous STAT 04/13/24 1007 04/14/24 1721   04/10/24 2200  ceFAZolin  (ANCEF ) IVPB 2g/100 mL premix        2 g 200 mL/hr over 30 Minutes Intravenous Every 8 hours 04/10/24 1833 04/11/24 0531   04/10/24 0600  ceFAZolin  (ANCEF ) IVPB 2g/100 mL premix        2  g 200 mL/hr over 30 Minutes Intravenous On call to O.R. 04/10/24 0533 04/10/24 1526         Note: Portions of this report may have been transcribed using voice recognition software. Every effort was made to ensure accuracy; however, inadvertent computerized transcription errors may be present.   Any transcriptional errors that result from this process are unintentional.    Elspeth KYM Schultze, MD, FACS, MASCRS Esophageal, Gastrointestinal & Colorectal Surgery Robotic and Minimally Invasive Surgery  [facility] [address] [place], [place] [address] [phone] Fax [phone] Main  CONTACT INFORMATION: Weekday (9AM-5PM): Call [facility] main office at [phone] Weeknight (5PM-9AM) or Weekend/Holiday: Check EPIC Web Links tab & use AMION (password  [email or web address]) for General Surgery [facility] coverage  Please, DO NOT use SecureChat  (it is not reliable communication to reach operating surgeons & will lead to a delay in care).   Epic staff messaging available for outptient concerns needing 1-2 business day response.      05/14/2024  6:45 AM

## 2024-05-14 NOTE — Progress Notes (Signed)
 PROGRESS NOTE    AUGUSTINE LEVERETTE  FMW:[id number] DOB: 1951-07-08 DOA: 04/10/2024 PCP: [name], MD    Brief Narrative:   Kristopher Thompson is a 73 y.o. male with past medical history significant for HTN, chronic diastolic congestive heart failure, CAD, DM2, pulmonary embolism on Eliquis, anemia, obesity, prostate cancer, bladder cancer who was initially admitted by the general surgery service on 5/30 for operative management regarding parastomal and incisional incarcerated abdominal wall hernias; requiring extensive lysis, mesh insertion, repair, urostomy ileal conduit revision thereafter admitted to the ICU due to tachycardia and tachypnea. Initially placed on norepinephrine  and an arterial line was placed. Patient declined NG tube placement but eventually agreed. Patient was taken back to the OR on 6/9 due to perforated bowel and postop shock. Required multiple pressors, steroids and PRBC transfusion after washout/closure in the OR. On 6/11 developed AKI requiring dialysis.. Patient was extubated on 6/14.   Significant hospital events/procedures: 5/30: Admit to general surgery, robotic LOA, incisional/parastomal, left inguinal incarcerated hernia repair with mesh, urostomy ileal conduit revision, Dr. Sheldon 6/2: CCM consult and vasopressor 6/4: off NE. Pulled out his NGT refused replacement. After multiple emesis  6/5: NG tube replaced. 6/6: transferred to MedSurg. 6/9: OR for perforated bowel, drainage from right abdominal wall abscess, intra-abdominal abscess, small bowel resection, wound VAC; Dr. Tanda 6/10: NE, Phenylephrine , vasopressin , steroids, 1u PRBCs, back to OR for LOA, abdominal wall debridement, ileal resection, end ileostomy abdominal wall hernia repairs with mesh, fascial closure, wound VAC, Dr. Sheldon 6/11: Worsening AKI with fluid overload, start CRRT, decreasing pressor requirements 6/14: Off CRRT, arousable.  Extubated. 6/15: transferred to [facility] for iHD; montoring  for renal recovery 6/16: iHD, severely encephalopathic with high BUN 6/17: restarted CRRT for to ongoing uremia and encephalopathy 6/18: was awake enough to work with PT, OT 6/23: bilateral PCN placed by interventional radiology, Dr. Jennefer 6/24: MS imrpoving, Neph tubes in and draining 6/30: bilateral nephrostomy tube exchange by IR 7/2: Right percutaneous nephrostomy exchange with ureteral stent by IR, cortrak removed today  Assessment & Plan:    Incisional/parastomal, left inguinal incarcerated hernia s/p robotic hernia repair Septic shock secondary to acute peritonitis from bowel perforation Patient presenting for surgical repair of incisional/parastomal and left inguinal incarcerated hernia and underwent robotic LOA with mesh placement and urostomy ileal conduit revision by Dr. Sheldon on 04/10/2024.  Postoperatively patient required vasopressors and was transferred to the ICU.  Subsequent return to the OR on 04/20/2024 for bowel perforation underwent I&D of abdominal wall/intra-abdominal abscess with small bowel resection.  Returned to OR on 6/10 for further abdominal wall debridement, LOA, ileal resection, end ileostomy with abdominal wall hernia repairs with mesh, fascial closure with wound VAC.  Seen by infectious disease, Dr. Dea; with recommendation of 4-6-week treatment given mesh placed in a contaminated field with EOT 05/18/2024. -- Surgery following, appreciate assistance -- Zosyn  3.375 g IV every 8 hours -- Micafungin  100 mg IV every 24 hours -- Per infectious disease, EOT 05/18/2024 -- Wound VAC changes twice weekly Monday/Thursday -- Further per general surgery  Acute renal failure secondary to ischemic ATN/shock Bilateral hydronephrosis/obstruction -- Nephrology, urology following, appreciate assistance -- Cr (0.97 04/01/2024), 2.08>>0.84>>3.70>>1.75>3.30>>2.61>2.60>2.26>2.15 -- Required CRRT followed by HD while inpatient, last HD 6/23 -- S/p bilateral PCN on 6/23,  exchanged 6/30  Urine leak w/ Hx prior radical cystectomy/ileal conduit urinary diversion CT loopogram 7/1 with finding of urine leak identified at inferior butt end of ileal conduit. Right percutaneous nephrostomy exchange with ureteral stent 7/2  by IR -- Further per urology and interventional radiology  Type 2 diabetes mellitus On metformin 500 mg p.o. daily outpatient.  Hemoglobin A1c 5.3 on 04/01/2024. -- Semglee  25u Wilson at bedtime -- Resistant SSI for coverage  Acute blood loss anemia Right flank hematoma Anemia of chronic medical disease Anemia panel with iron  54, TIBC 281, ferritin 588 -- Hgb 12.5>>7.5>>9.9>>10.9>11.4>12.1>11.7, stable  Chronic diastolic congestive heart failure HTN -- Hydralazine  20 mg IV every 4 hours as needed SBP greater than 160  HLD -- Crestor  10 mg p.o. nightly  History of pulmonary embolism On Eliquis at baseline. -- Heparin  drip discontinued 7/3 and restarted on Eliquis 5 mg p.o. twice daily  Moderate protein calorie malnutrition Obesity, class I Body mass index is 30.53 kg/m. Nutrition Status: Nutrition Problem: Increased nutrient needs Etiology: acute illness Signs/Symptoms: estimated needs Interventions: Tube feeding, Prostat, Juven  Weakness/ability/deconditioning: -- PT/OT currently recommends CIR -- Continue therapy efforts while inpatient   DVT prophylaxis: Place TED hose Start: 04/28/24 0818 SCD's Start: 04/10/24 1834 apixaban (ELIQUIS) tablet 5 mg    Code Status: Full Code Family Communication: Updated spouse present at bedside this morning  Disposition Plan:  Level of care: Med-Surg Status is: Inpatient Remains inpatient appropriate because: Antibiotics, await sign off of general surgery and urology, will need SNF placement    Consultants:  General Surgery Interventional radiology Urology Nephrology PCCM Infectious disease, Dr. Dea  Procedures:  robotic LOA, incisional/parastomal, left inguinal incarcerated  hernia repair with mesh, urostomy ileal conduit revision, Dr. Sheldon, 5/30 drainage from right abdominal wall abscess, intra-abdominal abscess, small bowel resection, wound VAC; Dr. Tanda, 6/9 LOA, abdominal wall debridement, ileal resection, end ileostomy, abdominal wall hernia repairs with mesh, wound VAC placement, Dr. Sheldon; 6/10 bilateral PCN placed by interventional radiology, Dr. Jennefer, 6/23 bilateral nephrostomy tube exchange by IR, 6/30 Right percutaneous nephrostomy exchange with ureteral stent by IR, 7/2  Antimicrobials:  Vancomycin  6/2 - 6/4 Metronidazole  6/2 - 6/5, 6/8 - 6/8 Cefepime  6/2 - 6/8 Zosyn  6/9> Micafungin  6/9>>   Subjective: Patient seen examined bedside, lying in bed.  Spouse present.  Eating breakfast, complaining of some nausea.  Underwent placement of right percutaneous nephrostomy tube yesterday with ureteral stent for urine leak by interventional radiology.  Seen by general surgery, Dr. Sheldon this morning.  No other issues or complaints, questions or concerns at this time.  Denies headache, no dizziness, no chest pain, no palpitations, no shortness of breath, no fever/chills/night sweats, no nausea/vomiting, no focal weakness, no fatigue, no paresthesia.  No acute events overnight per nurse staff.  Objective: Vitals:   05/13/24 1942 05/13/24 2043 05/13/24 2204 05/14/24 0233  BP:   91/64   Pulse:  (!) 108 (!) 112   Resp:      Temp: 97.7 F (36.5 C)  97.7 F (36.5 C) 98 F (36.7 C)  TempSrc: Oral  Oral Oral  SpO2:  97% 98%   Weight:      Height:        Intake/Output Summary (Last 24 hours) at 05/14/2024 1212 Last data filed at 05/14/2024 1153 Gross per 24 hour  Intake 880.34 ml  Output 2700 ml  Net -1819.66 ml   Filed Weights   05/04/24 2200 05/06/24 0500 05/07/24 0500  Weight: 44.9 kg 100.4 kg 99.3 kg    Examination:  Physical Exam: GEN: NAD, alert and oriented x 3, ill in appearance HEENT: NCAT, PERRL, EOMI, sclera clear, MMM PULM: CTAB w/o  wheezes/crackles, normal respiratory effort, on room air CV: RRR  w/o M/G/R GI: abd soft, mild TTP surrounding wound VAC site, ostomy noted with stool in collection bag; urostomy noted with urine in collection bag, bilateral PCNs noted with clear urine. MSK: no peripheral edema, muscle strength globally intact 5/5 bilateral upper/lower extremities NEURO: CN II-XII intact, no focal deficits, sensation to light touch intact PSYCH: normal mood/affect Integumentary: Abdominal wound VAC site noted/urostomy, otherwise no other concerning rashes/lesions/wounds noted on component skin service.    Data Reviewed: I have personally reviewed following labs and imaging studies  CBC: Recent Labs  Lab 05/10/24 0600 05/11/24 0500 05/12/24 0412 05/13/24 0500 05/14/24 0315  WBC 11.3* 9.4 9.3 11.0* 11.0*  HGB 10.5* 10.9* 11.4* 12.1* 11.7*  HCT 32.8* 34.2* 36.2* 37.4* 37.1*  MCV 94.3 92.4 93.5 92.6 93.9  PLT 131* 145* 147* 191 196   Basic Metabolic Panel: Recent Labs  Lab 05/10/24 0600 05/11/24 0000 05/11/24 0500 05/12/24 0412 05/13/24 0500 05/14/24 0315  NA 132* 133* 132* 132* 131* 131*  K 4.8 4.4 4.9 4.3 4.5 4.2  CL 100 100 99 99 101 101  CO2 18* 20* 19* 20* 16* 16*  GLUCOSE 100* 147* 183* 157* 166* 116*  BUN 89* 96* 95* 105* 107* 90*  CREATININE 2.29* 2.52* 2.61* 2.60* 2.26* 2.15*  CALCIUM  9.7 9.9 9.8 9.8 10.3 10.0  MG  --   --   --   --  2.3  --   PHOS 4.7*  --  1.2* 6.9* 5.8*  5.8* 5.7*   GFR: Estimated Creatinine Clearance: 37.8 mL/min (A) (by C-G formula based on SCr of 2.15 mg/dL (H)). Liver Function Tests: Recent Labs  Lab 05/11/24 0000 05/11/24 0500 05/12/24 0412 05/13/24 0500 05/14/24 0315  AST 34  --   --   --   --   ALT 58*  --   --   --   --   ALKPHOS 93  --   --   --   --   BILITOT 0.7  --   --   --   --   PROT 7.0  --   --   --   --   ALBUMIN  3.0* 3.0* 3.0* 3.1* 3.1*   No results for input(s): LIPASE, AMYLASE in the last 168 hours. No results for  input(s): AMMONIA in the last 168 hours. Coagulation Profile: No results for input(s): INR, PROTIME in the last 168 hours. Cardiac Enzymes: No results for input(s): CKTOTAL, CKMB, CKMBINDEX, TROPONINI in the last 168 hours. BNP (last 3 results) No results for input(s): PROBNP in the last 8760 hours. HbA1C: No results for input(s): HGBA1C in the last 72 hours. CBG: Recent Labs  Lab 05/13/24 1948 05/13/24 2203 05/14/24 0236 05/14/24 0754 05/14/24 1140  GLUCAP 117* 114* 110* 130* 136*   Lipid Profile: No results for input(s): CHOL, HDL, LDLCALC, TRIG, CHOLHDL, LDLDIRECT in the last 72 hours. Thyroid  Function Tests: No results for input(s): TSH, T4TOTAL, FREET4, T3FREE, THYROIDAB in the last 72 hours. Anemia Panel: No results for input(s): VITAMINB12, FOLATE, FERRITIN, TIBC, IRON , RETICCTPCT in the last 72 hours. Sepsis Labs: No results for input(s): PROCALCITON, LATICACIDVEN in the last 168 hours.  No results found for this or any previous visit (from the past 240 hours).       Radiology Studies: IR URETERAL STENT RIGHT NEW ACCESS W/SEP NEPHROSTOMY CATH Result Date: 05/14/2024 INDICATION: Patient with a history of a radical cystectomy/ileal conduit urinary diversion with a known leak of the ileal conduit. Planned retrograde placement of a ureteral stent and  replacement of the percutaneous nephrostomy tube for urinary diversion away from the leak. EXAM: Nephrostomy tube exchange, antegrade ureterogram with fluoroscopy, new placement retrograde ureteral stent COMPARISON:  None Available. MEDICATIONS: None; The antibiotic was administered in an appropriate time frame prior to skin puncture. ANESTHESIA/SEDATION: Moderate (conscious) sedation was employed during this procedure. A total of Versed  4 mg and Fentanyl  200 mcg was administered intravenously by the radiology nurse. Total intra-service moderate Sedation Time: 44 minutes. The  patient's level of consciousness and vital signs were monitored continuously by radiology nursing throughout the procedure under my direct supervision. CONTRAST:  35 mL Omnipaque  300-administered into the collecting system(s) FLUOROSCOPY: Radiation Exposure Index (as provided by the fluoroscopic device): 21 minutes 12 seconds 3,401 mGy Kerma COMPLICATIONS: None immediate. PROCEDURE: Informed written consent was obtained from the patient after a thorough discussion of the procedural risks, benefits and alternatives. All questions were addressed. Maximal Sterile Barrier Technique was utilized including caps, mask, sterile gowns, sterile gloves, sterile drape, hand hygiene and skin antiseptic. A timeout was performed prior to the initiation of the procedure. With patient in a left lateral decubitus position the right lower quadrant and right flank were prepped and draped in the usual sterile fashion. Dilute contrast was injected into the pre-existing percutaneous nephrostomy tube in order to perform a pyelogram the and visualized anatomy as well as identifying location of the catheter. Once this was established, retention suture and sterile dressing were removed the catheter was cut. An 018 and an 035 guidewire were advanced under fluoroscopy into the renal pelvis. The catheter was then removed over a guidewire. The 018 safety wire was then clamped external to the patient in order to lock its position. An angled glide catheter was then advanced over the guidewire and placed in the renal pelvis. And antegrade ureterogram was then performed by injecting the catheter in the renal pelvis and evaluating the right ureter. The ureter is dilated and the add leak near the anastomosis is identified. Using standard catheter and guidewire technique, the catheter and guidewire were advanced in antegrade fashion down the ureter past the leak and into the ileal conduit. Contrast was then injected into the ileum in order to identify  anatomy and further promote advancing the catheter under video x-ray. Using a combination of AP and lateral imaging, fluoroscopy was then done to continue to advance the guidewire and catheter through the ileum and out the ostomy. Once the guidewire was identified out the ostium it was then removed in order to have further purchase at the ostomy. The catheter was then advanced over the guidewire and out the patient through the ostomy. The guidewire was then removed leaving the catheter in position and a new guidewire was placed retrograde through the ureter from the ostomy. This was done under video x-rays and monitor for when the guidewire was identified at the renal pelvis. A 10 French pigtail catheter was then advanced after the 035 catheter was removed, over the guidewire, in retrograde fashion, and coiled in the renal pelvis there by placing a retrograde ureteral stent. The locking pigtail was engaged and the catheter was left in the ostomy. The safety wire was then used to advance a new 10 [place] percutaneous nephrostomy tube over the guidewire and back in percutaneously to the renal pelvis. The catheter was then coiled in position and retention suture was placed. The percutaneous nephrostomy tube was then connected to gravity drainage. IMPRESSION: Fluoroscopic guided replacement of percutaneous nephrostomy tube as well as antegrade pyelogram and contrast  injection for the ureter on the right side and subsequent placement of a new retrograde 10 French ureteral stent. The patient will return for routine exchanges of both catheters to be scheduled for a later date. Electronically Signed   By: Cordella Banner   On: 05/14/2024 09:35   IR URETERAL STENT PLACEMENT EXISTING ACCESS LEFT Result Date: 05/14/2024 INDICATION: Patient with a history of a radical cystectomy/ileal conduit urinary diversion with a known leak of the ileal conduit. Planned retrograde placement of a ureteral stent and replacement of the  percutaneous nephrostomy tube for urinary diversion away from the leak. EXAM: Nephrostomy tube exchange, antegrade ureterogram with fluoroscopy, new placement retrograde ureteral stent COMPARISON:  None Available. MEDICATIONS: None; The antibiotic was administered in an appropriate time frame prior to skin puncture. ANESTHESIA/SEDATION: Moderate (conscious) sedation was employed during this procedure. A total of Versed  4 mg and Fentanyl  200 mcg was administered intravenously by the radiology nurse. Total intra-service moderate Sedation Time: 44 minutes. The patient's level of consciousness and vital signs were monitored continuously by radiology nursing throughout the procedure under my direct supervision. CONTRAST:  35 mL Omnipaque  300-administered into the collecting system(s) FLUOROSCOPY: Radiation Exposure Index (as provided by the fluoroscopic device): 21 minutes 12 seconds 3,401 mGy Kerma COMPLICATIONS: None immediate. PROCEDURE: Informed written consent was obtained from the patient after a thorough discussion of the procedural risks, benefits and alternatives. All questions were addressed. Maximal Sterile Barrier Technique was utilized including caps, mask, sterile gowns, sterile gloves, sterile drape, hand hygiene and skin antiseptic. A timeout was performed prior to the initiation of the procedure. With patient in a left lateral decubitus position the right lower quadrant and right flank were prepped and draped in the usual sterile fashion. Dilute contrast was injected into the pre-existing percutaneous nephrostomy tube in order to perform a pyelogram the and visualized anatomy as well as identifying location of the catheter. Once this was established, retention suture and sterile dressing were removed the catheter was cut. An 018 and an 035 guidewire were advanced under fluoroscopy into the renal pelvis. The catheter was then removed over a guidewire. The 018 safety wire was then clamped external to the  patient in order to lock its position. An angled glide catheter was then advanced over the guidewire and placed in the renal pelvis. And antegrade ureterogram was then performed by injecting the catheter in the renal pelvis and evaluating the right ureter. The ureter is dilated and the add leak near the anastomosis is identified. Using standard catheter and guidewire technique, the catheter and guidewire were advanced in antegrade fashion down the ureter past the leak and into the ileal conduit. Contrast was then injected into the ileum in order to identify anatomy and further promote advancing the catheter under video x-ray. Using a combination of AP and lateral imaging, fluoroscopy was then done to continue to advance the guidewire and catheter through the ileum and out the ostomy. Once the guidewire was identified out the ostium it was then removed in order to have further purchase at the ostomy. The catheter was then advanced over the guidewire and out the patient through the ostomy. The guidewire was then removed leaving the catheter in position and a new guidewire was placed retrograde through the ureter from the ostomy. This was done under video x-rays and monitor for when the guidewire was identified at the renal pelvis. A 10 French pigtail catheter was then advanced after the 035 catheter was removed, over the guidewire, in retrograde fashion,  and coiled in the renal pelvis there by placing a retrograde ureteral stent. The locking pigtail was engaged and the catheter was left in the ostomy. The safety wire was then used to advance a new 10 [place] percutaneous nephrostomy tube over the guidewire and back in percutaneously to the renal pelvis. The catheter was then coiled in position and retention suture was placed. The percutaneous nephrostomy tube was then connected to gravity drainage. IMPRESSION: Fluoroscopic guided replacement of percutaneous nephrostomy tube as well as antegrade pyelogram and contrast  injection for the ureter on the right side and subsequent placement of a new retrograde 10 French ureteral stent. The patient will return for routine exchanges of both catheters to be scheduled for a later date. Electronically Signed   By: Cordella Banner   On: 05/14/2024 09:35        Scheduled Meds:  apixaban  5 mg Oral BID   Chlorhexidine  Gluconate Cloth  6 each Topical Daily   vitamin B-12  1,000 mcg Oral Daily   darbepoetin (ARANESP ) injection - DIALYSIS  200 mcg Subcutaneous Q Fri-1800   feeding supplement  237 mL Oral BID BM   feeding supplement (PROSource TF20)  60 mL Per Tube BID   insulin  aspart  0-20 Units Subcutaneous Q4H   insulin  glargine-yfgn  25 Units Subcutaneous QHS   melatonin  5 mg Oral QHS   metoCLOPramide   5 mg Oral TID AC & HS   multivitamin with minerals  1 tablet Oral Daily   nutrition supplement (JUVEN)  1 packet Oral BID BM   mouth rinse  15 mL Mouth Rinse 4 times per day   polycarbophil  625 mg Oral BID   rosuvastatin   10 mg Oral QPM   sevelamer  carbonate  800 mg Oral TID WC   sodium chloride  flush  5 mL Intracatheter Q8H   Continuous Infusions:  sodium chloride  Stopped (05/01/24 0014)   albumin  human     anticoagulant sodium citrate      micafungin  (MYCAMINE ) 100 mg in sodium chloride  0.9 % 100 mL IVPB 100 mg (05/14/24 0939)   piperacillin -tazobactam (ZOSYN )  IV 12.5 mL/hr at 05/14/24 0753     LOS: 34 days    Time spent: 56 minutes spent on 05/14/2024 caring for this patient face-to-face including chart review, ordering labs/tests, documenting, discussion with nursing staff, consultants, updating family and interview/physical exam    Camellia PARAS [place], DO [facility] Available via Epic secure chat 7am-7pm After these hours, please refer to coverage provider listed on [email or web address] 05/14/2024, 12:12 PM

## 2024-05-14 NOTE — Progress Notes (Signed)
 PHARMACY - ANTICOAGULATION CONSULT NOTE  Pharmacy Consult for Heparin  Indication: h/o PE  Allergies  Allergen Reactions   Demerol  [Meperidine ] Nausea And Vomiting and Other (See Comments)    SEVERE NAUSEA    Patient Measurements: Height: 5' 11 (180.3 cm) Weight: 99.3 kg (218 lb 14.7 oz) IBW/kg (Calculated) : 75.3 HEPARIN  DW (KG): 100.8  Vital Signs: Temp: 98 F (36.7 C) (07/03 0233) Temp Source: Oral (07/03 0233) BP: 91/64 (07/02 2204) Pulse Rate: 112 (07/02 2204)  Labs: Recent Labs    05/11/24 0500 05/12/24 0412 05/13/24 0500 05/14/24 0315  HGB 10.9* 11.4* 12.1* 11.7*  HCT 34.2* 36.2* 37.4* 37.1*  PLT 145* 147* 191 196  HEPARINUNFRC 0.27* 0.40 0.53 0.15*  CREATININE 2.61* 2.60* 2.26*  --     Estimated Creatinine Clearance: 36 mL/min (A) (by C-G formula based on SCr of 2.26 mg/dL (H)).  Assessment:  Patient with prolonged hospitalization including post-op complications resulting in bowel perforation with open abdomen. Has been closed and transferred to [facility]. On Eliquis PTA for hx of PE. Has been on heparin  gtt since 6/6.   AM: Heparin  level returned subtherapeutic after restart on 1900 units/hr (previously therapeutic at this rate). No bleeding per RN and CBC stable.  Goal of Therapy:  Heparin  level 0.3-0.7 units/ml Monitor platelets by anticoagulation protocol: Yes   Plan:  Increase heparin  gtt to 2000 units/hr  8h heparin  level Daily heparin  level and CBC daily Monitor for s/sx of bleeding Continue to hold DOAC because patient might still need a G-tube   Lynwood Poplar, PharmD, BCPS Clinical Pharmacist 05/14/2024 4:03 AM

## 2024-05-14 NOTE — Progress Notes (Signed)
 Physical Therapy Treatment Patient Details Name: Kristopher Thompson MRN: [id number] DOB: 10/26/1951 Today's Date: 05/14/2024   History of Present Illness 73 y/o M admitted to [facility] 5/30 for hernia repair and urostomy revision. 6/9 to OR for perforated bowel, post op shock, s/p exploratory lapartomy with drainage of Rt abdominal wall abscess. CRRT 6/11-6/14. Extubated 6/14, Transferred to [facility] 6/15 for intermittent HD. CRRT again 6/17-6/20. 6/23 bil perc nephrostomy tubes placed. PMHx: bladder and prostate CA, RBBB, CHF, colostomy, DM, GERD, abdominal surgery.    PT Comments  Patient continues to require max encouragement as he is very fearful of falling. ABle to progress to walking 3 ft forward with chair follow, seated rest, and repeated x3 ft again. Pt requires +2 mod assist to stand (using momentum), however walked with RW and +2 min assist. Continue to educate on benefits of incr activity and risks of inactivity.     If plan is discharge home, recommend the following: Assistance with cooking/housework;Assist for transportation;Help with stairs or ramp for entrance;Supervision due to cognitive status;A lot of help with walking and/or transfers;A lot of help with bathing/dressing/bathroom   Can travel by private vehicle     No  Equipment Recommendations  Rolling walker (2 wheels);BSC/3in1    Recommendations for Other Services       Precautions / Restrictions Precautions Precautions: Fall;Other (comment) Recall of Precautions/Restrictions: Impaired Precaution/Restrictions Comments: bil JP drains, ileostomy, urostomy, abd wound vac, bil perc nephrostomy drains Restrictions Weight Bearing Restrictions Per Provider Order: No     Mobility  Bed Mobility Overal bed mobility: Needs Assistance Bed Mobility: Rolling, Sidelying to Sit Rolling: Min assist Sidelying to sit: Mod assist       General bed mobility comments: vc for technique to protect abd; assist to roll body fully prior to  trying to sit up; assist to raise torso    Transfers Overall transfer level: Needs assistance Equipment used: Rolling walker (2 wheels) Transfers: Sit to/from Stand Sit to Stand: Mod assist, +2 physical assistance           General transfer comment: use of momentum to come to stand from EOB and recliner    Ambulation/Gait Ambulation/Gait assistance: +2 physical assistance, +2 safety/equipment, Min assist Gait Distance (Feet): 3 Feet (seated rest, 3 ft) Assistive device: Rolling walker (2 wheels) Gait Pattern/deviations: Step-to pattern, Decreased step length - right, Decreased step length - left, Shuffle, Trunk flexed Gait velocity: decr     General Gait Details: very fearful of falling; close follow with recliner   Stairs             Wheelchair Mobility     Tilt Bed    Modified Rankin (Stroke Patients Only)       Balance Overall balance assessment: Needs assistance Sitting-balance support: No upper extremity supported, Feet supported Sitting balance-Leahy Scale: Fair Sitting balance - Comments: able to static sit EOB without UB support   Standing balance support: Reliant on assistive device for balance Standing balance-Leahy Scale: Poor Standing balance comment: RW                            Communication Communication Communication: Impaired Factors Affecting Communication: Hearing impaired (hearing aids present but not charged)  Cognition Arousal: Alert Behavior During Therapy: Flat affect   PT - Cognitive impairments: Safety/Judgement, Initiation                       PT - Cognition  Comments: slow processing, complicated by [facility] Following commands: Impaired Following commands impaired: Follows one step commands with increased time    Cueing Cueing Techniques: Verbal cues, Gestural cues, Tactile cues  Exercises      General Comments General comments (skin integrity, edema, etc.): HR 120s sats 99%; dyspnea 3/4       Pertinent Vitals/Pain Pain Assessment Pain Assessment: Faces Faces Pain Scale: Hurts a little bit Pain Location: abd Pain Descriptors / Indicators: Sore, Tender Pain Intervention(s): Limited activity within patient's tolerance, Monitored during session    Home Living                          Prior Function            PT Goals (current goals can now be found in the care plan section) Acute Rehab PT Goals Patient Stated Goal: Regain independence PT Goal Formulation: With patient Time For Goal Achievement: 05/27/24 Potential to Achieve Goals: Good Progress towards PT goals: Progressing toward goals    Frequency    Min 2X/week      PT Plan      Co-evaluation              AM-PAC PT 6 Clicks Mobility   Outcome Measure  Help needed turning from your back to your side while in a flat bed without using bedrails?: A Little Help needed moving from lying on your back to sitting on the side of a flat bed without using bedrails?: A Lot Help needed moving to and from a bed to a chair (including a wheelchair)?: A Lot Help needed standing up from a chair using your arms (e.g., wheelchair or bedside chair)?: A Lot Help needed to walk in hospital room?: Total Help needed climbing 3-5 steps with a railing? : Total 6 Click Score: 11    End of Session Equipment Utilized During Treatment: Gait belt Activity Tolerance: Patient limited by fatigue Patient left: with family/visitor present;in chair;with chair alarm set;with call bell/phone within reach Nurse Communication: Mobility status PT Visit Diagnosis: Unsteadiness on feet (R26.81);Other abnormalities of gait and mobility (R26.89);Muscle weakness (generalized) (M62.81);Difficulty in walking, not elsewhere classified (R26.2) Pain - Right/Left:  (abdomen)     Time: 8648-8582 PT Time Calculation (min) (ACUTE ONLY): 26 min  Charges:    $Gait Training: 23-37 mins PT General Charges $$ ACUTE PT VISIT: 1  Visit                      Macario RAMAN, PT Acute Rehabilitation Services  Office [phone]    Macario SHAUNNA Soja 05/14/2024, 2:29 PM

## 2024-05-14 NOTE — Progress Notes (Signed)
   05/14/24 2357  BiPAP/CPAP/SIPAP  BiPAP/CPAP/SIPAP Pt Type Adult (Pt places himself on home unit. RT will assist as needed.)  BiPAP/CPAP/SIPAP Resmed  Dentures removed? Not applicable  Patient Home Machine Yes  Patient Home Mask Yes  Patient Home Tubing Yes  Device Plugged into [employer] Power Outlet Yes

## 2024-05-15 DIAGNOSIS — K631 Perforation of intestine (nontraumatic): Secondary | ICD-10-CM | POA: Diagnosis not present

## 2024-05-15 LAB — CBC
HCT: 36.8 % — ABNORMAL LOW (ref 39.0–52.0)
Hemoglobin: 11.7 g/dL — ABNORMAL LOW (ref 13.0–17.0)
MCH: 29.4 pg (ref 26.0–34.0)
MCHC: 31.8 g/dL (ref 30.0–36.0)
MCV: 92.5 fL (ref 80.0–100.0)
Platelets: 221 K/uL (ref 150–400)
RBC: 3.98 MIL/uL — ABNORMAL LOW (ref 4.22–5.81)
RDW: 19 % — ABNORMAL HIGH (ref 11.5–15.5)
WBC: 8.8 K/uL (ref 4.0–10.5)
nRBC: 0 % (ref 0.0–0.2)

## 2024-05-15 LAB — RENAL FUNCTION PANEL
Albumin: 3 g/dL — ABNORMAL LOW (ref 3.5–5.0)
Anion gap: 10 (ref 5–15)
BUN: 77 mg/dL — ABNORMAL HIGH (ref 8–23)
CO2: 19 mmol/L — ABNORMAL LOW (ref 22–32)
Calcium: 9.7 mg/dL (ref 8.9–10.3)
Chloride: 103 mmol/L (ref 98–111)
Creatinine, Ser: 1.89 mg/dL — ABNORMAL HIGH (ref 0.61–1.24)
GFR, Estimated: 37 mL/min — ABNORMAL LOW (ref >60)
Glucose, Bld: 110 mg/dL — ABNORMAL HIGH (ref 70–99)
Phosphorus: 5 mg/dL — ABNORMAL HIGH (ref 2.5–4.6)
Potassium: 3.8 mmol/L (ref 3.5–5.1)
Sodium: 132 mmol/L — ABNORMAL LOW (ref 135–145)

## 2024-05-15 LAB — GLUCOSE, CAPILLARY
Glucose-Capillary: 127 mg/dL — ABNORMAL HIGH (ref 70–99)
Glucose-Capillary: 167 mg/dL — ABNORMAL HIGH (ref 70–99)
Glucose-Capillary: 132 mg/dL — ABNORMAL HIGH (ref 70–99)
Glucose-Capillary: 112 mg/dL — ABNORMAL HIGH (ref 70–99)
Glucose-Capillary: 144 mg/dL — ABNORMAL HIGH (ref 70–99)
Glucose-Capillary: 114 mg/dL — ABNORMAL HIGH (ref 70–99)

## 2024-05-15 NOTE — Progress Notes (Signed)
 PROGRESS NOTE    Kristopher Thompson  FMW:[id number] DOB: 26-Nov-1950 DOA: 04/10/2024 PCP: [name], MD    Brief Narrative:   Kristopher Thompson is a 73 y.o. male with past medical history significant for HTN, chronic diastolic congestive heart failure, CAD, DM2, pulmonary embolism on Eliquis , anemia, obesity, prostate cancer, bladder cancer who was initially admitted by the general surgery service on 5/30 for operative management regarding parastomal and incisional incarcerated abdominal wall hernias; requiring extensive lysis, mesh insertion, repair, urostomy ileal conduit revision thereafter admitted to the ICU due to tachycardia and tachypnea. Initially placed on norepinephrine  and an arterial line was placed. Patient declined NG tube placement but eventually agreed. Patient was taken back to the OR on 6/9 due to perforated bowel and postop shock. Required multiple pressors, steroids and PRBC transfusion after washout/closure in the OR. On 6/11 developed AKI requiring dialysis.. Patient was extubated on 6/14.   Significant hospital events/procedures: 5/30: Admit to general surgery, robotic LOA, incisional/parastomal, left inguinal incarcerated hernia repair with mesh, urostomy ileal conduit revision, Dr. Sheldon 6/2: CCM consult and vasopressor 6/4: off NE. Pulled out his NGT refused replacement. After multiple emesis  6/5: NG tube replaced. 6/6: transferred to MedSurg. 6/9: OR for perforated bowel, drainage from right abdominal wall abscess, intra-abdominal abscess, small bowel resection, wound VAC; Dr. Tanda 6/10: NE, Phenylephrine , vasopressin , steroids, 1u PRBCs, back to OR for LOA, abdominal wall debridement, ileal resection, end ileostomy abdominal wall hernia repairs with mesh, fascial closure, wound VAC, Dr. Sheldon 6/11: Worsening AKI with fluid overload, start CRRT, decreasing pressor requirements 6/14: Off CRRT, arousable.  Extubated. 6/15: transferred to [facility] for iHD; montoring  for renal recovery 6/16: iHD, severely encephalopathic with high BUN 6/17: restarted CRRT for to ongoing uremia and encephalopathy 6/18: was awake enough to work with PT, OT 6/23: bilateral PCN placed by interventional radiology, Dr. Jennefer 6/24: MS imrpoving, Neph tubes in and draining 6/30: bilateral nephrostomy tube exchange by IR 7/2: Right percutaneous nephrostomy exchange with ureteral stent by IR, cortrak removed today  Assessment & Plan:    Incisional/parastomal, left inguinal incarcerated hernia s/p robotic hernia repair Septic shock secondary to acute peritonitis from bowel perforation Patient presenting for surgical repair of incisional/parastomal and left inguinal incarcerated hernia and underwent robotic LOA with mesh placement and urostomy ileal conduit revision by Dr. Sheldon on 04/10/2024.  Postoperatively patient required vasopressors and was transferred to the ICU.  Subsequent return to the OR on 04/20/2024 for bowel perforation underwent I&D of abdominal wall/intra-abdominal abscess with small bowel resection.  Returned to OR on 6/10 for further abdominal wall debridement, LOA, ileal resection, end ileostomy with abdominal wall hernia repairs with mesh, fascial closure with wound VAC.  Seen by infectious disease, Dr. Dea; with recommendation of 4-6-week treatment given mesh placed in a contaminated field with EOT 05/18/2024. -- Surgery following, appreciate assistance -- Zosyn  3.375 g IV every 8 hours -- Micafungin  100 mg IV every 24 hours -- Per infectious disease, EOT 05/18/2024 -- Wound VAC changes twice weekly Monday/Thursday -- Further per general surgery  Acute renal failure secondary to ischemic ATN/shock Bilateral hydronephrosis/obstruction -- Nephrology, urology following, appreciate assistance -- Cr (0.97 04/01/2024), 2.08>>0.84>>3.70>>1.75>3.30>>2.61>2.60>2.26>2.15>1.89 -- Required CRRT followed by HD while inpatient, last HD 6/23 -- S/p bilateral PCN on 6/23,  exchanged 6/30; right exchange 7/2  Urine leak w/ Hx prior radical cystectomy/ileal conduit urinary diversion CT loopogram 7/1 with finding of urine leak identified at inferior butt end of ileal conduit. Right percutaneous nephrostomy exchange with  ureteral stent 7/2 by IR -- Further per urology and interventional radiology  Type 2 diabetes mellitus On metformin 500 mg p.o. daily outpatient.  Hemoglobin A1c 5.3 on 04/01/2024. -- Semglee  25u [place] at bedtime -- Resistant SSI for coverage  Acute blood loss anemia Right flank hematoma Anemia of chronic medical disease Anemia panel with iron  54, TIBC 281, ferritin 588 -- Hgb 12.5>>7.5>>9.9>>10.9>11.4>12.1>11.7, stable  Chronic diastolic congestive heart failure HTN -- Hydralazine  20 mg IV every 4 hours as needed SBP greater than 160  HLD -- Crestor  10 mg p.o. nightly  History of pulmonary embolism On Eliquis  at baseline. -- Heparin  drip discontinued 7/3 and restarted on Eliquis  5 mg p.o. twice daily  Moderate protein calorie malnutrition Obesity, class I Body mass index is 30.53 kg/m. Nutrition Status: Nutrition Problem: Increased nutrient needs Etiology: acute illness Signs/Symptoms: estimated needs Interventions: Tube feeding, Prostat, Juven  Weakness/ability/deconditioning: -- PT/OT currently recommends CIR -- Continue therapy efforts while inpatient   DVT prophylaxis: Place TED hose Start: 04/28/24 0818 SCD's Start: 04/10/24 1834 apixaban  (ELIQUIS ) tablet 5 mg    Code Status: Full Code Family Communication: Updated spouse present at bedside this morning  Disposition Plan:  Level of care: Med-Surg Status is: Inpatient Remains inpatient appropriate because: Antibiotics, await sign off of general surgery and urology, will need SNF placement    Consultants:  General Surgery Interventional radiology Urology Nephrology: signed off 7/4 PCCM Infectious disease, Dr. Dea  Procedures:  robotic LOA,  incisional/parastomal, left inguinal incarcerated hernia repair with mesh, urostomy ileal conduit revision, Dr. Sheldon, 5/30 drainage from right abdominal wall abscess, intra-abdominal abscess, small bowel resection, wound VAC; Dr. Tanda, 6/9 LOA, abdominal wall debridement, ileal resection, end ileostomy, abdominal wall hernia repairs with mesh, wound VAC placement, Dr. Sheldon; 6/10 bilateral PCN placed by interventional radiology, Dr. Jennefer, 6/23 bilateral nephrostomy tube exchange by IR, 6/30 Right percutaneous nephrostomy exchange with ureteral stent by IR, 7/2  Antimicrobials:  Vancomycin  6/2 - 6/4 Metronidazole  6/2 - 6/5, 6/8 - 6/8 Cefepime  6/2 - 6/8 Zosyn  6/9> Micafungin  6/9>>   Subjective: Patient seen examined bedside, lying in bed.  Spouse present.  Continues with mild nausea with oral intake, improves with antiemetics.  Slowly increasing the amount of oral intake.  Nephrology signed off today with improvement of creatinine.  No other issues or complaints, questions or concerns at this time.  Denies headache, no dizziness, no chest pain, no palpitations, no shortness of breath, no fever/chills/night sweats, no nausea/vomiting, no focal weakness, no fatigue, no paresthesia.  No acute events overnight per nursing staff.  Objective: Vitals:   05/14/24 1950 05/15/24 0328 05/15/24 0733 05/15/24 1135  BP: 113/75 121/87 100/69 107/70  Pulse: (!) 110 (!) 103 (!) 110 (!) 114  Resp: 16 18 16 16   Temp: 98 F (36.7 C) 97.8 F (36.6 C) 98 F (36.7 C) 97.7 F (36.5 C)  TempSrc: Oral Oral Oral Oral  SpO2: 95% 97% 97% 100%  Weight:      Height:        Intake/Output Summary (Last 24 hours) at 05/15/2024 1212 Last data filed at 05/15/2024 0438 Gross per 24 hour  Intake 564.29 ml  Output 1255 ml  Net -690.71 ml   Filed Weights   05/04/24 2200 05/06/24 0500 05/07/24 0500  Weight: 44.9 kg 100.4 kg 99.3 kg    Examination:  Physical Exam: GEN: NAD, alert and oriented x 3, ill in  appearance HEENT: NCAT, PERRL, EOMI, sclera clear, MMM PULM: CTAB w/o wheezes/crackles, normal respiratory effort,  on room air CV: RRR w/o M/G/R GI: abd soft, mild TTP surrounding wound VAC site, ostomy noted with stool in collection bag; urostomy noted with urine in collection bag, bilateral PCNs noted with clear urine. MSK: no peripheral edema, muscle strength globally intact 5/5 bilateral upper/lower extremities NEURO: CN II-XII intact, no focal deficits, sensation to light touch intact PSYCH: normal mood/affect Integumentary: Abdominal wound VAC site noted/urostomy, otherwise no other concerning rashes/lesions/wounds noted on component skin service.    Data Reviewed: I have personally reviewed following labs and imaging studies  CBC: Recent Labs  Lab 05/11/24 0500 05/12/24 0412 05/13/24 0500 05/14/24 0315 05/15/24 0610  WBC 9.4 9.3 11.0* 11.0* 8.8  HGB 10.9* 11.4* 12.1* 11.7* 11.7*  HCT 34.2* 36.2* 37.4* 37.1* 36.8*  MCV 92.4 93.5 92.6 93.9 92.5  PLT 145* 147* 191 196 221   Basic Metabolic Panel: Recent Labs  Lab 05/11/24 0500 05/12/24 0412 05/13/24 0500 05/14/24 0315 05/15/24 0610  NA 132* 132* 131* 131* 132*  K 4.9 4.3 4.5 4.2 3.8  CL 99 99 101 101 103  CO2 19* 20* 16* 16* 19*  GLUCOSE 183* 157* 166* 116* 110*  BUN 95* 105* 107* 90* 77*  CREATININE 2.61* 2.60* 2.26* 2.15* 1.89*  CALCIUM  9.8 9.8 10.3 10.0 9.7  MG  --   --  2.3  --   --   PHOS 1.2* 6.9* 5.8*  5.8* 5.7* 5.0*   GFR: Estimated Creatinine Clearance: 43 mL/min (A) (by C-G formula based on SCr of 1.89 mg/dL (H)). Liver Function Tests: Recent Labs  Lab 05/11/24 0000 05/11/24 0500 05/12/24 0412 05/13/24 0500 05/14/24 0315 05/15/24 0610  AST 34  --   --   --   --   --   ALT 58*  --   --   --   --   --   ALKPHOS 93  --   --   --   --   --   BILITOT 0.7  --   --   --   --   --   PROT 7.0  --   --   --   --   --   ALBUMIN  3.0* 3.0* 3.0* 3.1* 3.1* 3.0*   No results for input(s): LIPASE,  AMYLASE in the last 168 hours. No results for input(s): AMMONIA in the last 168 hours. Coagulation Profile: No results for input(s): INR, PROTIME in the last 168 hours. Cardiac Enzymes: No results for input(s): CKTOTAL, CKMB, CKMBINDEX, TROPONINI in the last 168 hours. BNP (last 3 results) No results for input(s): PROBNP in the last 8760 hours. HbA1C: No results for input(s): HGBA1C in the last 72 hours. CBG: Recent Labs  Lab 05/14/24 1635 05/14/24 1948 05/14/24 2310 05/15/24 0731 05/15/24 1130  GLUCAP 126* 109* 99 127* 167*   Lipid Profile: No results for input(s): CHOL, HDL, LDLCALC, TRIG, CHOLHDL, LDLDIRECT in the last 72 hours. Thyroid  Function Tests: No results for input(s): TSH, T4TOTAL, FREET4, T3FREE, THYROIDAB in the last 72 hours. Anemia Panel: No results for input(s): VITAMINB12, FOLATE, FERRITIN, TIBC, IRON , RETICCTPCT in the last 72 hours. Sepsis Labs: No results for input(s): PROCALCITON, LATICACIDVEN in the last 168 hours.  No results found for this or any previous visit (from the past 240 hours).       Radiology Studies: IR URETERAL STENT RIGHT NEW ACCESS W/SEP NEPHROSTOMY CATH Result Date: 05/14/2024 INDICATION: Patient with a history of a radical cystectomy/ileal conduit urinary diversion with a known leak of the ileal conduit. Planned  retrograde placement of a ureteral stent and replacement of the percutaneous nephrostomy tube for urinary diversion away from the leak. EXAM: Nephrostomy tube exchange, antegrade ureterogram with fluoroscopy, new placement retrograde ureteral stent COMPARISON:  None Available. MEDICATIONS: None; The antibiotic was administered in an appropriate time frame prior to skin puncture. ANESTHESIA/SEDATION: Moderate (conscious) sedation was employed during this procedure. A total of Versed  4 mg and Fentanyl  200 mcg was administered intravenously by the radiology nurse. Total  intra-service moderate Sedation Time: 44 minutes. The patient's level of consciousness and vital signs were monitored continuously by radiology nursing throughout the procedure under my direct supervision. CONTRAST:  35 mL Omnipaque  300-administered into the collecting system(s) FLUOROSCOPY: Radiation Exposure Index (as provided by the fluoroscopic device): 21 minutes 12 seconds 3,401 mGy Kerma COMPLICATIONS: None immediate. PROCEDURE: Informed written consent was obtained from the patient after a thorough discussion of the procedural risks, benefits and alternatives. All questions were addressed. Maximal Sterile Barrier Technique was utilized including caps, mask, sterile gowns, sterile gloves, sterile drape, hand hygiene and skin antiseptic. A timeout was performed prior to the initiation of the procedure. With patient in a left lateral decubitus position the right lower quadrant and right flank were prepped and draped in the usual sterile fashion. Dilute contrast was injected into the pre-existing percutaneous nephrostomy tube in order to perform a pyelogram the and visualized anatomy as well as identifying location of the catheter. Once this was established, retention suture and sterile dressing were removed the catheter was cut. An 018 and an 035 guidewire were advanced under fluoroscopy into the renal pelvis. The catheter was then removed over a guidewire. The 018 safety wire was then clamped external to the patient in order to lock its position. An angled glide catheter was then advanced over the guidewire and placed in the renal pelvis. And antegrade ureterogram was then performed by injecting the catheter in the renal pelvis and evaluating the right ureter. The ureter is dilated and the add leak near the anastomosis is identified. Using standard catheter and guidewire technique, the catheter and guidewire were advanced in antegrade fashion down the ureter past the leak and into the ileal conduit. Contrast  was then injected into the ileum in order to identify anatomy and further promote advancing the catheter under video x-ray. Using a combination of AP and lateral imaging, fluoroscopy was then done to continue to advance the guidewire and catheter through the ileum and out the ostomy. Once the guidewire was identified out the ostium it was then removed in order to have further purchase at the ostomy. The catheter was then advanced over the guidewire and out the patient through the ostomy. The guidewire was then removed leaving the catheter in position and a new guidewire was placed retrograde through the ureter from the ostomy. This was done under video x-rays and monitor for when the guidewire was identified at the renal pelvis. A 10 French pigtail catheter was then advanced after the 035 catheter was removed, over the guidewire, in retrograde fashion, and coiled in the renal pelvis there by placing a retrograde ureteral stent. The locking pigtail was engaged and the catheter was left in the ostomy. The safety wire was then used to advance a new 10 [place] percutaneous nephrostomy tube over the guidewire and back in percutaneously to the renal pelvis. The catheter was then coiled in position and retention suture was placed. The percutaneous nephrostomy tube was then connected to gravity drainage. IMPRESSION: Fluoroscopic guided replacement of percutaneous nephrostomy tube  as well as antegrade pyelogram and contrast injection for the ureter on the right side and subsequent placement of a new retrograde 10 French ureteral stent. The patient will return for routine exchanges of both catheters to be scheduled for a later date. Electronically Signed   By: Cordella Banner   On: 05/14/2024 09:35   IR URETERAL STENT PLACEMENT EXISTING ACCESS LEFT Result Date: 05/14/2024 INDICATION: Patient with a history of a radical cystectomy/ileal conduit urinary diversion with a known leak of the ileal conduit. Planned retrograde  placement of a ureteral stent and replacement of the percutaneous nephrostomy tube for urinary diversion away from the leak. EXAM: Nephrostomy tube exchange, antegrade ureterogram with fluoroscopy, new placement retrograde ureteral stent COMPARISON:  None Available. MEDICATIONS: None; The antibiotic was administered in an appropriate time frame prior to skin puncture. ANESTHESIA/SEDATION: Moderate (conscious) sedation was employed during this procedure. A total of Versed  4 mg and Fentanyl  200 mcg was administered intravenously by the radiology nurse. Total intra-service moderate Sedation Time: 44 minutes. The patient's level of consciousness and vital signs were monitored continuously by radiology nursing throughout the procedure under my direct supervision. CONTRAST:  35 mL Omnipaque  300-administered into the collecting system(s) FLUOROSCOPY: Radiation Exposure Index (as provided by the fluoroscopic device): 21 minutes 12 seconds 3,401 mGy Kerma COMPLICATIONS: None immediate. PROCEDURE: Informed written consent was obtained from the patient after a thorough discussion of the procedural risks, benefits and alternatives. All questions were addressed. Maximal Sterile Barrier Technique was utilized including caps, mask, sterile gowns, sterile gloves, sterile drape, hand hygiene and skin antiseptic. A timeout was performed prior to the initiation of the procedure. With patient in a left lateral decubitus position the right lower quadrant and right flank were prepped and draped in the usual sterile fashion. Dilute contrast was injected into the pre-existing percutaneous nephrostomy tube in order to perform a pyelogram the and visualized anatomy as well as identifying location of the catheter. Once this was established, retention suture and sterile dressing were removed the catheter was cut. An 018 and an 035 guidewire were advanced under fluoroscopy into the renal pelvis. The catheter was then removed over a guidewire.  The 018 safety wire was then clamped external to the patient in order to lock its position. An angled glide catheter was then advanced over the guidewire and placed in the renal pelvis. And antegrade ureterogram was then performed by injecting the catheter in the renal pelvis and evaluating the right ureter. The ureter is dilated and the add leak near the anastomosis is identified. Using standard catheter and guidewire technique, the catheter and guidewire were advanced in antegrade fashion down the ureter past the leak and into the ileal conduit. Contrast was then injected into the ileum in order to identify anatomy and further promote advancing the catheter under video x-ray. Using a combination of AP and lateral imaging, fluoroscopy was then done to continue to advance the guidewire and catheter through the ileum and out the ostomy. Once the guidewire was identified out the ostium it was then removed in order to have further purchase at the ostomy. The catheter was then advanced over the guidewire and out the patient through the ostomy. The guidewire was then removed leaving the catheter in position and a new guidewire was placed retrograde through the ureter from the ostomy. This was done under video x-rays and monitor for when the guidewire was identified at the renal pelvis. A 10 French pigtail catheter was then advanced after the 035 catheter was  removed, over the guidewire, in retrograde fashion, and coiled in the renal pelvis there by placing a retrograde ureteral stent. The locking pigtail was engaged and the catheter was left in the ostomy. The safety wire was then used to advance a new 10 [place] percutaneous nephrostomy tube over the guidewire and back in percutaneously to the renal pelvis. The catheter was then coiled in position and retention suture was placed. The percutaneous nephrostomy tube was then connected to gravity drainage. IMPRESSION: Fluoroscopic guided replacement of percutaneous  nephrostomy tube as well as antegrade pyelogram and contrast injection for the ureter on the right side and subsequent placement of a new retrograde 10 French ureteral stent. The patient will return for routine exchanges of both catheters to be scheduled for a later date. Electronically Signed   By: Cordella Banner   On: 05/14/2024 09:35        Scheduled Meds:  apixaban   5 mg Oral BID   Chlorhexidine  Gluconate Cloth  6 each Topical Daily   vitamin B-12  1,000 mcg Oral Daily   feeding supplement  237 mL Oral BID BM   feeding supplement (PROSource TF20)  60 mL Per Tube BID   insulin  aspart  0-20 Units Subcutaneous Q4H   insulin  glargine-yfgn  25 Units Subcutaneous QHS   melatonin  5 mg Oral QHS   metoCLOPramide   5 mg Oral TID AC & HS   multivitamin with minerals  1 tablet Oral Daily   nutrition supplement (JUVEN)  1 packet Oral BID BM   mouth rinse  15 mL Mouth Rinse 4 times per day   polycarbophil  625 mg Oral BID   rosuvastatin   10 mg Oral QPM   sodium chloride  flush  5 mL Intracatheter Q8H   Continuous Infusions:  sodium chloride  Stopped (05/01/24 0014)   albumin  human     anticoagulant sodium citrate      micafungin  (MYCAMINE ) 100 mg in sodium chloride  0.9 % 100 mL IVPB 100 mg (05/15/24 1149)   piperacillin -tazobactam (ZOSYN )  IV 3.375 g (05/15/24 0630)     LOS: 35 days    Time spent: 47 minutes spent on 05/15/2024 caring for this patient face-to-face including chart review, ordering labs/tests, documenting, discussion with nursing staff, consultants, updating family and interview/physical exam    Camellia PARAS [place], DO [facility] Available via Epic secure chat 7am-7pm After these hours, please refer to coverage provider listed on [email or web address] 05/15/2024, 12:12 PM

## 2024-05-15 NOTE — Progress Notes (Signed)
 Physical Therapy Treatment Patient Details Name: Kristopher Thompson MRN: [id number] DOB: Jul 22, 1951 Today's Date: 05/15/2024   History of Present Illness 73 y/o M admitted to [facility] 5/30 for hernia repair and urostomy revision. 6/9 to OR for perforated bowel, post op shock, s/p exploratory lapartomy with drainage of Rt abdominal wall abscess. CRRT 6/11-6/14. Extubated 6/14, Transferred to [facility] 6/15 for intermittent HD. CRRT again 6/17-6/20. 6/23 bil perc nephrostomy tubes placed. PMHx: bladder and prostate CA, RBBB, CHF, colostomy, DM, GERD, abdominal surgery.    PT Comments  Pt in bed upon PT arrival to room, requiring min encouragement to participate in PT stating I sat up in the chair earlier, I don't want to get back in it. Pt overall requiring min-mod assist for bed mobility, transfers, and short-distance gait beside bed. Pt unable to progress beyond this, pt endorsing fatigue but suspect pt is also self-limiting at this point. PT discussed d/c options with pt, including higher intensity post-acute therapies, but pt declines this stating he needs slower pace. Pt's wife at bedside, and agrees. PT to continue to follow.      If plan is discharge home, recommend the following: Assistance with cooking/housework;Assist for transportation;Help with stairs or ramp for entrance;Supervision due to cognitive status;A lot of help with walking and/or transfers;A lot of help with bathing/dressing/bathroom   Can travel by private vehicle     No  Equipment Recommendations  Rolling walker (2 wheels);BSC/3in1    Recommendations for Other Services       Precautions / Restrictions Precautions Precautions: Fall;Other (comment) Precaution/Restrictions Comments: bil JP drains, ileostomy, urostomy, abd wound vac, bil perc nephrostomy drains Restrictions Weight Bearing Restrictions Per Provider Order: No     Mobility  Bed Mobility Overal bed mobility: Needs Assistance Bed Mobility: Rolling, Sidelying to  Sit, Sit to Sidelying Rolling: Min assist Sidelying to sit: Mod assist   Sit to supine: Mod assist   General bed mobility comments: assist for log roll technique, trunk and LE management moving sidelying<>sit. dizzy upon sitting EOB, VSS    Transfers Overall transfer level: Needs assistance Equipment used: Rolling walker (2 wheels) Transfers: Sit to/from Stand, Bed to chair/wheelchair/BSC Sit to Stand: Min assist           General transfer comment: assist for initial power up and steadying.    Ambulation/Gait Ambulation/Gait assistance: Min assist, +2 safety/equipment Gait Distance (Feet): 5 Feet Assistive device: Rolling walker (2 wheels) Gait Pattern/deviations: Step-to pattern, Decreased step length - right, Decreased step length - left, Shuffle, Trunk flexed Gait velocity: decr     General Gait Details: assist to steady, guide RW, cues for RW use. Pt very self-limiting   Stairs             Wheelchair Mobility     Tilt Bed    Modified Rankin (Stroke Patients Only)       Balance Overall balance assessment: Needs assistance Sitting-balance support: Feet supported Sitting balance-Leahy Scale: Good     Standing balance support: Reliant on assistive device for balance Standing balance-Leahy Scale: Poor                              Communication Communication Communication: No apparent difficulties Factors Affecting Communication: Hearing impaired  Cognition Arousal: Alert Behavior During Therapy: Flat affect   PT - Cognitive impairments: Safety/Judgement, Initiation  PT - Cognition Comments: slowed processing, somewhat self-limiting Following commands: Intact Following commands impaired: Only follows one step commands consistently    Cueing Cueing Techniques: Verbal cues  Exercises General Exercises - Lower Extremity Long Arc Quad: AROM, Both, Seated (12) Hip Flexion/Marching: Both, 10 reps, Seated,  AROM    General Comments        Pertinent Vitals/Pain Pain Assessment Pain Assessment: Faces Faces Pain Scale: Hurts little more Pain Location: abdomen Pain Descriptors / Indicators: Tender Pain Intervention(s): Limited activity within patient's tolerance, Monitored during session, Repositioned    Home Living                          Prior Function            PT Goals (current goals can now be found in the care plan section) Acute Rehab PT Goals Patient Stated Goal: Regain independence PT Goal Formulation: With patient Time For Goal Achievement: 05/27/24 Potential to Achieve Goals: Good Progress towards PT goals: Progressing toward goals    Frequency    Min 2X/week      PT Plan      Co-evaluation              AM-PAC PT 6 Clicks Mobility   Outcome Measure  Help needed turning from your back to your side while in a flat bed without using bedrails?: A Little Help needed moving from lying on your back to sitting on the side of a flat bed without using bedrails?: A Lot Help needed moving to and from a bed to a chair (including a wheelchair)?: A Lot Help needed standing up from a chair using your arms (e.g., wheelchair or bedside chair)?: A Lot Help needed to walk in hospital room?: A Lot Help needed climbing 3-5 steps with a railing? : Total 6 Click Score: 12    End of Session   Activity Tolerance: Patient limited by fatigue Patient left: in bed;with bed alarm set;with call bell/phone within reach;with family/visitor present Nurse Communication: Mobility status PT Visit Diagnosis: Unsteadiness on feet (R26.81);Other abnormalities of gait and mobility (R26.89);Muscle weakness (generalized) (M62.81);Difficulty in walking, not elsewhere classified (R26.2)     Time: 8591-8569 PT Time Calculation (min) (ACUTE ONLY): 22 min  Charges:    $Therapeutic Activity: 8-22 mins PT General Charges $$ ACUTE PT VISIT: 1 Visit                     Johana RAMAN, PT DPT Acute Rehabilitation Services Secure Chat Preferred  Office [phone]    Neill Jurewicz FOR[place] 05/15/2024, 2:47 PM

## 2024-05-15 NOTE — Progress Notes (Signed)
 [facility] NEPHROLOGY PROGRESS NOTE  Assessment/ Plan: Pt is a 73 y.o. yo male  with hypertension, DM, CAD, hx bladder cancer, CHF, right bundle branch block who had acute bowel perforation and status post OR for bowel repair on 6/8, lysis of adhesion, ileal resection end ileostomy hernia repairs, septic shock seen for AKI and oliguria.   # Acute kidney injury, oliguric with anasarca likely ischemic ATN due to septic shock and obstructive uropathy given bilateral hydronephrosis. CRRT 6/11-6/14 and then on 6/17- 6/20 followed by IHD.  After replacing percutaneous nephrostomy tube patient has increased urine output.  The temporary HD line was removed on 6/25. The patient continued to have good urine output and serum creatinine level now trending down to 1.89.  Clinically improving.  No further need for dialysis.  Continue monitor urine output and lab.   #  Bilateral hydronephrosis severe on the left side: S/p revision of ileal conduit 5/30, urology following -> s/p bilateral percutaneous nephrostomy tube placement by IR on 05/04/24 to divert urine.  Recommend to follow-up with urologist as outpatient.  #Acute bowel perforation is status post bowel repair ileal conduit and hernia repairs urostomy ileal conduit revision. As per surgical team.   # Anemia: Hemoglobin at goal.  No further need for erythropoietin at this time.  # HTN/volume: Monitor BP, volume looks acceptable.  # CKD-MBD: Expect to improve phosphorus level with renal recovery.  Sign off, please call us  back with question.  Subjective: Seen and examined.  Urine output is around 1.2 L.  He is sitting on chair.  Denies nausea, vomiting, chest pain or shortness of breath.  Family member at the bedside. Objective Vital signs in last 24 hours: Vitals:   05/14/24 1610 05/14/24 1950 05/15/24 0328 05/15/24 0733  BP: 122/76 113/75 121/87 100/69  Pulse: (!) 108 (!) 110 (!) 103 (!) 110  Resp: 18 16 18 16   Temp: 98 F (36.7  C) 98 F (36.7 C) 97.8 F (36.6 C) 98 F (36.7 C)  TempSrc: Oral Oral Oral Oral  SpO2: 95% 95% 97% 97%  Weight:      Height:       Weight change:   Intake/Output Summary (Last 24 hours) at 05/15/2024 1052 Last data filed at 05/15/2024 0438 Gross per 24 hour  Intake 924.29 ml  Output 1905 ml  Net -980.71 ml       Labs: RENAL PANEL Recent Labs  Lab 05/11/24 0500 05/12/24 0412 05/13/24 0500 05/14/24 0315 05/15/24 0610  NA 132* 132* 131* 131* 132*  K 4.9 4.3 4.5 4.2 3.8  CL 99 99 101 101 103  CO2 19* 20* 16* 16* 19*  GLUCOSE 183* 157* 166* 116* 110*  BUN 95* 105* 107* 90* 77*  CREATININE 2.61* 2.60* 2.26* 2.15* 1.89*  CALCIUM  9.8 9.8 10.3 10.0 9.7  MG  --   --  2.3  --   --   PHOS 1.2* 6.9* 5.8*  5.8* 5.7* 5.0*  ALBUMIN  3.0* 3.0* 3.1* 3.1* 3.0*    Liver Function Tests: Recent Labs  Lab 05/11/24 0000 05/11/24 0500 05/13/24 0500 05/14/24 0315 05/15/24 0610  AST 34  --   --   --   --   ALT 58*  --   --   --   --   ALKPHOS 93  --   --   --   --   BILITOT 0.7  --   --   --   --   PROT 7.0  --   --   --   --  ALBUMIN  3.0*   < > 3.1* 3.1* 3.0*   < > = values in this interval not displayed.   No results for input(s): LIPASE, AMYLASE in the last 168 hours. No results for input(s): AMMONIA in the last 168 hours. CBC: Recent Labs    05/02/24 0515 05/04/24 0341 05/11/24 0500 05/12/24 0412 05/13/24 0500 05/14/24 0315 05/15/24 0610  HGB  --    < > 10.9* 11.4* 12.1* 11.7* 11.7*  MCV  --    < > 92.4 93.5 92.6 93.9 92.5  FERRITIN 588*  --   --   --   --   --   --   TIBC 281  --   --   --   --   --   --   IRON  54  --   --   --   --   --   --    < > = values in this interval not displayed.    Cardiac Enzymes: No results for input(s): CKTOTAL, CKMB, CKMBINDEX, TROPONINI in the last 168 hours. CBG: Recent Labs  Lab 05/14/24 1140 05/14/24 1635 05/14/24 1948 05/14/24 2310 05/15/24 0731  GLUCAP 136* 126* 109* 99 127*    Iron  Studies: No  results for input(s): IRON , TIBC, TRANSFERRIN, FERRITIN in the last 72 hours. Studies/Results: IR URETERAL STENT RIGHT NEW ACCESS W/SEP NEPHROSTOMY CATH Result Date: 05/14/2024 INDICATION: Patient with a history of a radical cystectomy/ileal conduit urinary diversion with a known leak of the ileal conduit. Planned retrograde placement of a ureteral stent and replacement of the percutaneous nephrostomy tube for urinary diversion away from the leak. EXAM: Nephrostomy tube exchange, antegrade ureterogram with fluoroscopy, new placement retrograde ureteral stent COMPARISON:  None Available. MEDICATIONS: None; The antibiotic was administered in an appropriate time frame prior to skin puncture. ANESTHESIA/SEDATION: Moderate (conscious) sedation was employed during this procedure. A total of Versed  4 mg and Fentanyl  200 mcg was administered intravenously by the radiology nurse. Total intra-service moderate Sedation Time: 44 minutes. The patient's level of consciousness and vital signs were monitored continuously by radiology nursing throughout the procedure under my direct supervision. CONTRAST:  35 mL Omnipaque  300-administered into the collecting system(s) FLUOROSCOPY: Radiation Exposure Index (as provided by the fluoroscopic device): 21 minutes 12 seconds 3,401 mGy Kerma COMPLICATIONS: None immediate. PROCEDURE: Informed written consent was obtained from the patient after a thorough discussion of the procedural risks, benefits and alternatives. All questions were addressed. Maximal Sterile Barrier Technique was utilized including caps, mask, sterile gowns, sterile gloves, sterile drape, hand hygiene and skin antiseptic. A timeout was performed prior to the initiation of the procedure. With patient in a left lateral decubitus position the right lower quadrant and right flank were prepped and draped in the usual sterile fashion. Dilute contrast was injected into the pre-existing percutaneous nephrostomy tube in  order to perform a pyelogram the and visualized anatomy as well as identifying location of the catheter. Once this was established, retention suture and sterile dressing were removed the catheter was cut. An 018 and an 035 guidewire were advanced under fluoroscopy into the renal pelvis. The catheter was then removed over a guidewire. The 018 safety wire was then clamped external to the patient in order to lock its position. An angled glide catheter was then advanced over the guidewire and placed in the renal pelvis. And antegrade ureterogram was then performed by injecting the catheter in the renal pelvis and evaluating the right ureter. The ureter is dilated and the add leak near  the anastomosis is identified. Using standard catheter and guidewire technique, the catheter and guidewire were advanced in antegrade fashion down the ureter past the leak and into the ileal conduit. Contrast was then injected into the ileum in order to identify anatomy and further promote advancing the catheter under video x-ray. Using a combination of AP and lateral imaging, fluoroscopy was then done to continue to advance the guidewire and catheter through the ileum and out the ostomy. Once the guidewire was identified out the ostium it was then removed in order to have further purchase at the ostomy. The catheter was then advanced over the guidewire and out the patient through the ostomy. The guidewire was then removed leaving the catheter in position and a new guidewire was placed retrograde through the ureter from the ostomy. This was done under video x-rays and monitor for when the guidewire was identified at the renal pelvis. A 10 French pigtail catheter was then advanced after the 035 catheter was removed, over the guidewire, in retrograde fashion, and coiled in the renal pelvis there by placing a retrograde ureteral stent. The locking pigtail was engaged and the catheter was left in the ostomy. The safety wire was then used to  advance a new 10 [place] percutaneous nephrostomy tube over the guidewire and back in percutaneously to the renal pelvis. The catheter was then coiled in position and retention suture was placed. The percutaneous nephrostomy tube was then connected to gravity drainage. IMPRESSION: Fluoroscopic guided replacement of percutaneous nephrostomy tube as well as antegrade pyelogram and contrast injection for the ureter on the right side and subsequent placement of a new retrograde 10 French ureteral stent. The patient will return for routine exchanges of both catheters to be scheduled for a later date. Electronically Signed   By: Cordella Banner   On: 05/14/2024 09:35   IR URETERAL STENT PLACEMENT EXISTING ACCESS LEFT Result Date: 05/14/2024 INDICATION: Patient with a history of a radical cystectomy/ileal conduit urinary diversion with a known leak of the ileal conduit. Planned retrograde placement of a ureteral stent and replacement of the percutaneous nephrostomy tube for urinary diversion away from the leak. EXAM: Nephrostomy tube exchange, antegrade ureterogram with fluoroscopy, new placement retrograde ureteral stent COMPARISON:  None Available. MEDICATIONS: None; The antibiotic was administered in an appropriate time frame prior to skin puncture. ANESTHESIA/SEDATION: Moderate (conscious) sedation was employed during this procedure. A total of Versed  4 mg and Fentanyl  200 mcg was administered intravenously by the radiology nurse. Total intra-service moderate Sedation Time: 44 minutes. The patient's level of consciousness and vital signs were monitored continuously by radiology nursing throughout the procedure under my direct supervision. CONTRAST:  35 mL Omnipaque  300-administered into the collecting system(s) FLUOROSCOPY: Radiation Exposure Index (as provided by the fluoroscopic device): 21 minutes 12 seconds 3,401 mGy Kerma COMPLICATIONS: None immediate. PROCEDURE: Informed written consent was obtained from the  patient after a thorough discussion of the procedural risks, benefits and alternatives. All questions were addressed. Maximal Sterile Barrier Technique was utilized including caps, mask, sterile gowns, sterile gloves, sterile drape, hand hygiene and skin antiseptic. A timeout was performed prior to the initiation of the procedure. With patient in a left lateral decubitus position the right lower quadrant and right flank were prepped and draped in the usual sterile fashion. Dilute contrast was injected into the pre-existing percutaneous nephrostomy tube in order to perform a pyelogram the and visualized anatomy as well as identifying location of the catheter. Once this was established, retention suture and sterile dressing  were removed the catheter was cut. An 018 and an 035 guidewire were advanced under fluoroscopy into the renal pelvis. The catheter was then removed over a guidewire. The 018 safety wire was then clamped external to the patient in order to lock its position. An angled glide catheter was then advanced over the guidewire and placed in the renal pelvis. And antegrade ureterogram was then performed by injecting the catheter in the renal pelvis and evaluating the right ureter. The ureter is dilated and the add leak near the anastomosis is identified. Using standard catheter and guidewire technique, the catheter and guidewire were advanced in antegrade fashion down the ureter past the leak and into the ileal conduit. Contrast was then injected into the ileum in order to identify anatomy and further promote advancing the catheter under video x-ray. Using a combination of AP and lateral imaging, fluoroscopy was then done to continue to advance the guidewire and catheter through the ileum and out the ostomy. Once the guidewire was identified out the ostium it was then removed in order to have further purchase at the ostomy. The catheter was then advanced over the guidewire and out the patient through the  ostomy. The guidewire was then removed leaving the catheter in position and a new guidewire was placed retrograde through the ureter from the ostomy. This was done under video x-rays and monitor for when the guidewire was identified at the renal pelvis. A 10 French pigtail catheter was then advanced after the 035 catheter was removed, over the guidewire, in retrograde fashion, and coiled in the renal pelvis there by placing a retrograde ureteral stent. The locking pigtail was engaged and the catheter was left in the ostomy. The safety wire was then used to advance a new 10 [place] percutaneous nephrostomy tube over the guidewire and back in percutaneously to the renal pelvis. The catheter was then coiled in position and retention suture was placed. The percutaneous nephrostomy tube was then connected to gravity drainage. IMPRESSION: Fluoroscopic guided replacement of percutaneous nephrostomy tube as well as antegrade pyelogram and contrast injection for the ureter on the right side and subsequent placement of a new retrograde 10 French ureteral stent. The patient will return for routine exchanges of both catheters to be scheduled for a later date. Electronically Signed   By: Cordella Banner   On: 05/14/2024 09:35    Medications: Infusions:  sodium chloride  Stopped (05/01/24 0014)   albumin  human     anticoagulant sodium citrate      micafungin  (MYCAMINE ) 100 mg in sodium chloride  0.9 % 100 mL IVPB Stopped (05/14/24 1039)   piperacillin -tazobactam (ZOSYN )  IV 3.375 g (05/15/24 0630)    Scheduled Medications:  apixaban   5 mg Oral BID   Chlorhexidine  Gluconate Cloth  6 each Topical Daily   vitamin B-12  1,000 mcg Oral Daily   feeding supplement  237 mL Oral BID BM   feeding supplement (PROSource TF20)  60 mL Per Tube BID   insulin  aspart  0-20 Units Subcutaneous Q4H   insulin  glargine-yfgn  25 Units Subcutaneous QHS   melatonin  5 mg Oral QHS   metoCLOPramide   5 mg Oral TID AC & HS   multivitamin  with minerals  1 tablet Oral Daily   nutrition supplement (JUVEN)  1 packet Oral BID BM   mouth rinse  15 mL Mouth Rinse 4 times per day   polycarbophil  625 mg Oral BID   rosuvastatin   10 mg Oral QPM   sevelamer  carbonate  800 mg Oral TID WC   sodium chloride  flush  5 mL Intracatheter Q8H    have reviewed scheduled and prn medications.  Physical Exam: General:NAD, comfortable Heart:RRR, s1s2 nl Lungs:clear b/l, no crackle Abdomen:soft, nephrostomy tube in place Extremities:No edema Dialysis Access: No HD line.  Barron Vanloan Prasad Husayn Reim 05/15/2024,10:52 AM  LOS: 35 days

## 2024-05-15 NOTE — Progress Notes (Signed)
 Occupational Therapy Treatment Patient Details Name: Kristopher Thompson MRN: [id number] DOB: 06/27/1951 Today's Date: 05/15/2024   History of present illness 73 y/o M admitted to [facility] 5/30 for hernia repair and urostomy revision. 6/9 to OR for perforated bowel, post op shock, s/p exploratory lapartomy with drainage of Rt abdominal wall abscess. CRRT 6/11-6/14. Extubated 6/14, Transferred to [facility] 6/15 for intermittent HD. CRRT again 6/17-6/20. 6/23 bil perc nephrostomy tubes placed. PMHx: bladder and prostate CA, RBBB, CHF, colostomy, DM, GERD, abdominal surgery.   OT comments  Subjectively he looks better, but is still exhibiting self limiting behaviors.  Mod A for bed mobility, Max A for lower body ADL, stood with closer to Min A, and took steps to the recliner with CGA.  OT will continue efforts in the acute setting to address deficits, and Patient will benefit from continued inpatient follow up therapy, <3 hours/day.      If plan is discharge home, recommend the following:  Two people to help with walking and/or transfers;Two people to help with bathing/dressing/bathroom;Assistance with cooking/housework;Assistance with feeding;Direct supervision/assist for medications management;Direct supervision/assist for financial management;Assist for transportation;Help with stairs or ramp for entrance   Equipment Recommendations       Recommendations for Other Services      Precautions / Restrictions Precautions Precautions: Fall;Other (comment) Precaution/Restrictions Comments: bil JP drains, ileostomy, urostomy, abd wound vac, bil perc nephrostomy drains Restrictions Weight Bearing Restrictions Per Provider Order: No       Mobility Bed Mobility Overal bed mobility: Needs Assistance Bed Mobility: Supine to Sit     Supine to sit: Mod assist          Transfers Overall transfer level: Needs assistance Equipment used: Rolling walker (2 wheels) Transfers: Sit to/from Stand, Bed to  chair/wheelchair/BSC Sit to Stand: Min assist     Step pivot transfers: Min assist           Balance Overall balance assessment: Needs assistance Sitting-balance support: Feet supported Sitting balance-Leahy Scale: Good     Standing balance support: Reliant on assistive device for balance Standing balance-Leahy Scale: Poor                             ADL either performed or assessed with clinical judgement   ADL                       Lower Body Dressing: Maximal assistance;Sitting/lateral leans;Sit to/from stand   Toilet Transfer: Minimal assistance;Stand-pivot;BSC/3in1   Toileting- Clothing Manipulation and Hygiene: Total assistance;Sit to/from stand              Extremity/Trunk Assessment Upper Extremity Assessment Upper Extremity Assessment: Generalized weakness   Lower Extremity Assessment Lower Extremity Assessment: Defer to PT evaluation        Vision Patient Visual Report: No change from baseline     Perception Perception Perception: Not tested   Praxis Praxis Praxis: Not tested   Communication Communication Communication: No apparent difficulties Factors Affecting Communication: Hearing impaired   Cognition Arousal: Alert Behavior During Therapy: Flat affect Cognition: Cognition impaired         Attention impairment (select first level of impairment): Selective attention Executive functioning impairment (select all impairments): Initiation OT - Cognition Comments: patient's mentation is improving nicely, exhibits self limiting behaviors.                 Following commands: Intact Following commands impaired: Only follows one step  commands consistently      Cueing   Cueing Techniques: Verbal cues  Exercises      Shoulder Instructions       General Comments      Pertinent Vitals/ Pain       Pain Assessment Pain Assessment: Faces Faces Pain Scale: Hurts a little bit Pain Location: abdomen Pain  Descriptors / Indicators: Tender Pain Intervention(s): Monitored during session                                                          Frequency  Min 2X/week        Progress Toward Goals  OT Goals(current goals can now be found in the care plan section)  Progress towards OT goals: Progressing toward goals  Acute Rehab OT Goals OT Goal Formulation: With patient Time For Goal Achievement: 05/25/24 Potential to Achieve Goals: Fair ADL Goals Pt Will Perform Grooming: with supervision;standing Pt Will Perform Upper Body Dressing: with set-up;sitting Pt Will Perform Lower Body Dressing: with mod assist;sit to/from stand Pt Will Transfer to Toilet: with contact guard assist;stand pivot transfer;bedside commode Additional ADL Goal #1: Bed mobility with supervision and use of sdie rails to increased Ind with toileting.  Plan      Co-evaluation                 AM-PAC OT 6 Clicks Daily Activity     Outcome Measure   Help from another person eating meals?: A Little Help from another person taking care of personal grooming?: A Little Help from another person toileting, which includes using toliet, bedpan, or urinal?: A Lot Help from another person bathing (including washing, rinsing, drying)?: A Lot Help from another person to put on and taking off regular upper body clothing?: A Little Help from another person to put on and taking off regular lower body clothing?: A Lot 6 Click Score: 15    End of Session Equipment Utilized During Treatment: Rolling walker (2 wheels)  OT Visit Diagnosis: Unsteadiness on feet (R26.81);Other abnormalities of gait and mobility (R26.89);Muscle weakness (generalized) (M62.81)   Activity Tolerance Other (comment) (self limits)   Patient Left with family/visitor present;in chair   Nurse Communication Mobility status        Time: [phone] OT Time Calculation (min): 20 min  Charges: OT General Charges $OT  Visit: 1 Visit OT Treatments $Self Care/Home Management : 8-22 mins  05/15/2024  RP, OTR/L  Acute Rehabilitation Services  Office:  [phone]   Kristopher Thompson 05/15/2024, 9:45 AM

## 2024-05-15 NOTE — Plan of Care (Signed)
   Problem: Activity: Goal: Risk for activity intolerance will decrease Outcome: Progressing   Problem: Nutrition: Goal: Adequate nutrition will be maintained Outcome: Progressing   Problem: Elimination: Goal: Will not experience complications related to bowel motility Outcome: Progressing   Problem: Safety: Goal: Ability to remain free from injury will improve Outcome: Progressing

## 2024-05-15 NOTE — Progress Notes (Signed)
  24 Days Post-Op   Chief Complaint/Subjective: No new issues  Objective: Vital signs in last 24 hours: Temp:  [97.8 F (36.6 C)-98 F (36.7 C)] 98 F (36.7 C) (07/04 0733) Pulse Rate:  [100-110] 110 (07/04 0733) Resp:  [16-18] 16 (07/04 0733) BP: (100-122)/(69-87) 100/69 (07/04 0733) SpO2:  [95 %-97 %] 97 % (07/04 0733) Last BM Date : 05/14/24 Intake/Output from previous day: 07/03 0701 - 07/04 0700 In: 2404.6 [P.O.:1200; I.V.:204.9; NG/GT:134.7; IV Piggyback:765.1] Out: 2305 [Urine:1200; Drains:155; Stool:950]  PE: Gen: NAD Resp: nonlabored Card: tachycardic Abd: vac in place, ostomy functional, urostomy with some clear red fluid in bag, drains in place  Lab Results:  Recent Labs    05/14/24 0315 05/15/24 0610  WBC 11.0* 8.8  HGB 11.7* 11.7*  HCT 37.1* 36.8*  PLT 196 221   Recent Labs    05/14/24 0315 05/15/24 0610  NA 131* 132*  K 4.2 3.8  CL 101 103  CO2 16* 19*  GLUCOSE 116* 110*  BUN 90* 77*  CREATININE 2.15* 1.89*  CALCIUM  10.0 9.7   No results for input(s): LABPROT, INR in the last 72 hours.    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  This care required moderate level of medical decision making.   Herlene Righter [facility] 05/15/2024, 10:37 AM Please see Amion for pager number during day hours 7:00am-4:30pm or 7:00am -11:30am on weekends

## 2024-05-16 DIAGNOSIS — K631 Perforation of intestine (nontraumatic): Secondary | ICD-10-CM | POA: Diagnosis not present

## 2024-05-16 LAB — GLUCOSE, CAPILLARY
Glucose-Capillary: 115 mg/dL — ABNORMAL HIGH (ref 70–99)
Glucose-Capillary: 121 mg/dL — ABNORMAL HIGH (ref 70–99)
Glucose-Capillary: 186 mg/dL — ABNORMAL HIGH (ref 70–99)
Glucose-Capillary: 123 mg/dL — ABNORMAL HIGH (ref 70–99)
Glucose-Capillary: 127 mg/dL — ABNORMAL HIGH (ref 70–99)

## 2024-05-16 LAB — CBC
HCT: 40.3 % (ref 39.0–52.0)
Hemoglobin: 12.9 g/dL — ABNORMAL LOW (ref 13.0–17.0)
MCH: 29.8 pg (ref 26.0–34.0)
MCHC: 32 g/dL (ref 30.0–36.0)
MCV: 93.1 fL (ref 80.0–100.0)
Platelets: 232 K/uL (ref 150–400)
RBC: 4.33 MIL/uL (ref 4.22–5.81)
RDW: 18.4 % — ABNORMAL HIGH (ref 11.5–15.5)
WBC: 8.8 K/uL (ref 4.0–10.5)
nRBC: 0 % (ref 0.0–0.2)

## 2024-05-16 LAB — RENAL FUNCTION PANEL
Albumin: 3.3 g/dL — ABNORMAL LOW (ref 3.5–5.0)
Anion gap: 13 (ref 5–15)
BUN: 78 mg/dL — ABNORMAL HIGH (ref 8–23)
CO2: 17 mmol/L — ABNORMAL LOW (ref 22–32)
Calcium: 10.2 mg/dL (ref 8.9–10.3)
Chloride: 101 mmol/L (ref 98–111)
Creatinine, Ser: 2.11 mg/dL — ABNORMAL HIGH (ref 0.61–1.24)
GFR, Estimated: 33 mL/min — ABNORMAL LOW (ref >60)
Glucose, Bld: 114 mg/dL — ABNORMAL HIGH (ref 70–99)
Phosphorus: 5.1 mg/dL — ABNORMAL HIGH (ref 2.5–4.6)
Potassium: 4 mmol/L (ref 3.5–5.1)
Sodium: 131 mmol/L — ABNORMAL LOW (ref 135–145)

## 2024-05-16 NOTE — Progress Notes (Signed)
 Patient ID: Kristopher Thompson, male   DOB: [birth year], 73 y.o.   MRN: [id number] 25 Days Post-Op    Subjective: Has been eating, AM nausea improves as the days go ROS negative except as listed above. Objective: Vital signs in last 24 hours: Temp:  [97.5 F (36.4 C)-97.8 F (36.6 C)] 97.5 F (36.4 C) (07/05 0739) Pulse Rate:  [101-114] 101 (07/05 0739) Resp:  [16-20] 20 (07/05 0739) BP: (107-123)/(70-77) 115/75 (07/05 0739) SpO2:  [97 %-100 %] 98 % (07/05 0739) Last BM Date : 05/15/24  Intake/Output from previous day: 07/04 0701 - 07/05 0700 In: 982.2 [P.O.:720; IV Piggyback:252.2] Out: 2200 [Urine:1100; Drains:475; Stool:625] Intake/Output this shift: No intake/output data recorded.  Abd soft, VAC midline, ostomy, urostomy and drains on R  Lab Results: CBC  Recent Labs    05/15/24 0610 05/16/24 0326  WBC 8.8 8.8  HGB 11.7* 12.9*  HCT 36.8* 40.3  PLT 221 232   BMET Recent Labs    05/15/24 0610 05/16/24 0326  NA 132* 131*  K 3.8 4.0  CL 103 101  CO2 19* 17*  GLUCOSE 110* 114*  BUN 77* 78*  CREATININE 1.89* 2.11*  CALCIUM  9.7 10.2   PT/INR No results for input(s): LABPROT, INR in the last 72 hours. ABG No results for input(s): PHART, HCO3 in the last 72 hours.  Invalid input(s): PCO2, PO2  Studies/Results: No results found.  Anti-infectives: Anti-infectives (From admission, onward)    Start     Dose/Rate Route Frequency Ordered Stop   05/06/24 1400  piperacillin -tazobactam (ZOSYN ) IVPB 3.375 g        3.375 g 12.5 mL/hr over 240 Minutes Intravenous Every 8 hours 05/06/24 1227     05/01/24 1400  piperacillin -tazobactam (ZOSYN ) IVPB 2.25 g  Status:  Discontinued        2.25 g 100 mL/hr over 30 Minutes Intravenous Every 8 hours 05/01/24 0957 05/06/24 1227   05/01/24 1045  micafungin  (MYCAMINE ) 100 mg in sodium chloride  0.9 % 100 mL IVPB        100 mg 105 mL/hr over 1 Hours Intravenous Every 24 hours 05/01/24 0949     04/29/24 1000   micafungin  (MYCAMINE ) 150 mg in sodium chloride  0.9 % 100 mL IVPB  Status:  Discontinued        150 mg 107.5 mL/hr over 1 Hours Intravenous Every 24 hours 04/28/24 1036 05/01/24 0949   04/28/24 1345  micafungin  (MYCAMINE ) 100 mg in sodium chloride  0.9 % 100 mL IVPB        100 mg 105 mL/hr over 1 Hours Intravenous  Once 04/28/24 1245 04/28/24 1354   04/28/24 1000  micafungin  (MYCAMINE ) 100 mg in sodium chloride  0.9 % 100 mL IVPB  Status:  Discontinued        100 mg 105 mL/hr over 1 Hours Intravenous Every 24 hours 04/27/24 1219 04/28/24 1036   04/21/24 1400  clindamycin  (CLEOCIN ) 900 mg, gentamicin  (GARAMYCIN ) 240 mg in sodium chloride  0.9 % 1,000 mL for intraperitoneal lavage  Status:  Discontinued         Irrigation To Surgery 04/21/24 1346 04/21/24 1618   04/20/24 1400  piperacillin -tazobactam (ZOSYN ) IVPB 3.375 g  Status:  Discontinued        3.375 g 12.5 mL/hr over 240 Minutes Intravenous Every 8 hours 04/20/24 0755 05/01/24 0957   04/20/24 1000  metroNIDAZOLE  (FLAGYL ) IVPB 500 mg  Status:  Discontinued        500 mg 100 mL/hr over 60 Minutes Intravenous Every  12 hours 04/20/24 0320 04/20/24 0752   04/20/24 1000  micafungin  (MYCAMINE ) 150 mg in sodium chloride  0.9 % 100 mL IVPB  Status:  Discontinued        150 mg 107.5 mL/hr over 1 Hours Intravenous Every 24 hours 04/20/24 0752 04/27/24 1219   04/20/24 0245  ceFEPIme  (MAXIPIME ) 2 g in sodium chloride  0.9 % 100 mL IVPB  Status:  Discontinued        2 g 200 mL/hr over 30 Minutes Intravenous Every 8 hours 04/20/24 0232 04/20/24 0752   04/20/24 0100  metroNIDAZOLE  (FLAGYL ) IVPB 500 mg        500 mg 100 mL/hr over 60 Minutes Intravenous On call to O.R. 04/20/24 0004 04/20/24 0100   04/14/24 1800  ceFEPIme  (MAXIPIME ) 2 g in sodium chloride  0.9 % 100 mL IVPB        2 g 200 mL/hr over 30 Minutes Intravenous Every 8 hours 04/14/24 1003 04/19/24 0957   04/14/24 1600  vancomycin  (VANCOCIN ) IVPB 1000 mg/200 mL premix  Status:  Discontinued         1,000 mg 200 mL/hr over 60 Minutes Intravenous Every 24 hours 04/13/24 1420 04/14/24 1000   04/14/24 1600  vancomycin  (VANCOREADY) IVPB 1500 mg/300 mL  Status:  Discontinued        1,500 mg 150 mL/hr over 120 Minutes Intravenous Every 24 hours 04/14/24 1002 04/16/24 0744   04/14/24 1200  vancomycin  (VANCOCIN ) IVPB 1000 mg/200 mL premix  Status:  Discontinued        1,000 mg 200 mL/hr over 60 Minutes Intravenous Every 24 hours 04/13/24 1053 04/13/24 1109   04/13/24 2200  ceFEPIme  (MAXIPIME ) 2 g in sodium chloride  0.9 % 100 mL IVPB  Status:  Discontinued        2 g 200 mL/hr over 30 Minutes Intravenous Every 12 hours 04/13/24 1036 04/13/24 1109   04/13/24 2200  ceFEPIme  (MAXIPIME ) 2 g in sodium chloride  0.9 % 100 mL IVPB  Status:  Discontinued        2 g 200 mL/hr over 30 Minutes Intravenous Every 12 hours 04/13/24 1425 04/14/24 1003   04/13/24 1500  vancomycin  (VANCOCIN ) 2,000 mg in sodium chloride  0.9 % 500 mL IVPB        2,000 mg 260 mL/hr over 120 Minutes Intravenous  Once 04/13/24 1409 04/13/24 1850   04/13/24 1500  ceFEPIme  (MAXIPIME ) 2 g in sodium chloride  0.9 % 100 mL IVPB  Status:  Discontinued        2 g 200 mL/hr over 30 Minutes Intravenous Every 12 hours 04/13/24 1420 04/13/24 1425   04/13/24 1500  metroNIDAZOLE  (FLAGYL ) IVPB 500 mg  Status:  Discontinued        500 mg 100 mL/hr over 60 Minutes Intravenous Every 12 hours 04/13/24 1420 04/17/24 0725   04/13/24 1200  vancomycin  (VANCOCIN ) 2,000 mg in sodium chloride  0.9 % 500 mL IVPB  Status:  Discontinued        2,000 mg 260 mL/hr over 120 Minutes Intravenous  Once 04/13/24 1052 04/13/24 1121   04/13/24 1100  metroNIDAZOLE  (FLAGYL ) IVPB 500 mg  Status:  Discontinued        500 mg 100 mL/hr over 60 Minutes Intravenous 2 times daily 04/13/24 1007 04/13/24 1109   04/13/24 1100  vancomycin  (VANCOCIN ) IVPB 1000 mg/200 mL premix  Status:  Discontinued        1,000 mg 200 mL/hr over 60 Minutes Intravenous  Once 04/13/24 1007  04/13/24 1020   04/13/24  1015  ceFEPIme  (MAXIPIME ) 2 g in sodium chloride  0.9 % 100 mL IVPB        2 g 200 mL/hr over 30 Minutes Intravenous STAT 04/13/24 1007 04/14/24 1721   04/10/24 2200  ceFAZolin  (ANCEF ) IVPB 2g/100 mL premix        2 g 200 mL/hr over 30 Minutes Intravenous Every 8 hours 04/10/24 1833 04/11/24 0531   04/10/24 0600  ceFAZolin  (ANCEF ) IVPB 2g/100 mL premix        2 g 200 mL/hr over 30 Minutes Intravenous On call to O.R. 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

## 2024-05-16 NOTE — Plan of Care (Signed)
  Problem: Nutrition: Goal: Adequate nutrition will be maintained Outcome: Progressing   Problem: Pain Managment: Goal: General experience of comfort will improve and/or be controlled Outcome: Progressing   Problem: Safety: Goal: Ability to remain free from injury will improve Outcome: Progressing   Problem: Skin Integrity: Goal: Risk for impaired skin integrity will decrease Outcome: Progressing   Problem: Coping: Goal: Ability to adjust to condition or change in health will improve Outcome: Progressing   Problem: Skin Integrity: Goal: Risk for impaired skin integrity will decrease Outcome: Progressing

## 2024-05-16 NOTE — Progress Notes (Signed)
 PROGRESS NOTE    Kristopher Thompson  FMW:[id number] DOB: Oct 06, 1951 DOA: 04/10/2024 PCP: [name], MD    Brief Narrative:   Kristopher Thompson is a 73 y.o. male with past medical history significant for HTN, chronic diastolic congestive heart failure, CAD, DM2, pulmonary embolism on Eliquis , anemia, obesity, prostate cancer, bladder cancer who was initially admitted by the general surgery service on 5/30 for operative management regarding parastomal and incisional incarcerated abdominal wall hernias; requiring extensive lysis, mesh insertion, repair, urostomy ileal conduit revision thereafter admitted to the ICU due to tachycardia and tachypnea. Initially placed on norepinephrine  and an arterial line was placed. Patient declined NG tube placement but eventually agreed. Patient was taken back to the OR on 6/9 due to perforated bowel and postop shock. Required multiple pressors, steroids and PRBC transfusion after washout/closure in the OR. On 6/11 developed AKI requiring dialysis.. Patient was extubated on 6/14.   Significant hospital events/procedures: 5/30: Admit to general surgery, robotic LOA, incisional/parastomal, left inguinal incarcerated hernia repair with mesh, urostomy ileal conduit revision, Dr. Sheldon 6/2: CCM consult and vasopressor 6/4: off NE. Pulled out his NGT refused replacement. After multiple emesis  6/5: NG tube replaced. 6/6: transferred to MedSurg. 6/9: OR for perforated bowel, drainage from right abdominal wall abscess, intra-abdominal abscess, small bowel resection, wound VAC; Dr. Tanda 6/10: NE, Phenylephrine , vasopressin , steroids, 1u PRBCs, back to OR for LOA, abdominal wall debridement, ileal resection, end ileostomy abdominal wall hernia repairs with mesh, fascial closure, wound VAC, Dr. Sheldon 6/11: Worsening AKI with fluid overload, start CRRT, decreasing pressor requirements 6/14: Off CRRT, arousable.  Extubated. 6/15: transferred to [facility] for iHD; montoring  for renal recovery 6/16: iHD, severely encephalopathic with high BUN 6/17: restarted CRRT for to ongoing uremia and encephalopathy 6/18: was awake enough to work with PT, OT 6/23: bilateral PCN placed by interventional radiology, Dr. Jennefer 6/24: MS imrpoving, Neph tubes in and draining 6/30: bilateral nephrostomy tube exchange by IR 7/2: Right percutaneous nephrostomy exchange with ureteral stent by IR, cortrak removed  Assessment & Plan:    Incisional/parastomal, left inguinal incarcerated hernia s/p robotic hernia repair Septic shock secondary to acute peritonitis from bowel perforation Patient presenting for surgical repair of incisional/parastomal and left inguinal incarcerated hernia and underwent robotic LOA with mesh placement and urostomy ileal conduit revision by Dr. Sheldon on 04/10/2024.  Postoperatively patient required vasopressors and was transferred to the ICU.  Subsequent return to the OR on 04/20/2024 for bowel perforation underwent I&D of abdominal wall/intra-abdominal abscess with small bowel resection.  Returned to OR on 6/10 for further abdominal wall debridement, LOA, ileal resection, end ileostomy with abdominal wall hernia repairs with mesh, fascial closure with wound VAC.  Seen by infectious disease, Dr. Dea; with recommendation of 4-6-week treatment given mesh placed in a contaminated field with EOT 05/18/2024. -- Surgery following, appreciate assistance -- Zosyn  3.375 g IV every 8 hours -- Micafungin  100 mg IV every 24 hours -- Per infectious disease, EOT 05/18/2024 -- Wound VAC changes twice weekly Monday/Thursday -- Further per general surgery  Acute renal failure secondary to ischemic ATN/shock Bilateral hydronephrosis/obstruction -- Nephrology, urology following, appreciate assistance -- Cr (0.97 04/01/2024), 2.08>>0.84>>3.70>>1.75>3.30>>2.61>2.60>2.26>2.15>1.89>2.11 -- Required CRRT followed by HD while inpatient, last HD 6/23 -- S/p bilateral PCN on 6/23,  exchanged 6/30; right exchanged 7/2  Urine leak w/ Hx prior radical cystectomy/ileal conduit urinary diversion CT loopogram 7/1 with finding of urine leak identified at inferior butt end of ileal conduit. Right percutaneous nephrostomy exchange with ureteral  stent 7/2 by IR -- Further per urology and interventional radiology  Type 2 diabetes mellitus On metformin 500 mg p.o. daily outpatient.  Hemoglobin A1c 5.3 on 04/01/2024. -- Semglee  25u [place] at bedtime -- Resistant SSI for coverage  Acute blood loss anemia Right flank hematoma Anemia of chronic medical disease Anemia panel with iron  54, TIBC 281, ferritin 588 -- Hgb 12.5>>7.5>>9.9>>10.9>11.4>12.1>11.7>12.9, stable  Chronic diastolic congestive heart failure HTN -- Hydralazine  20 mg IV every 4 hours as needed SBP greater than 160  HLD -- Crestor  10 mg p.o. nightly  History of pulmonary embolism On Eliquis  at baseline. -- Heparin  drip discontinued 7/3 and restarted on Eliquis  5 mg p.o. twice daily  Moderate protein calorie malnutrition Obesity, class I Body mass index is 30.53 kg/m. Nutrition Status: Nutrition Problem: Increased nutrient needs Etiology: acute illness Signs/Symptoms: estimated needs Interventions: Tube feeding, Prostat, Juven  Weakness/ability/deconditioning: -- PT/OT currently recommends CIR -- Continue therapy efforts while inpatient   DVT prophylaxis: Place TED hose Start: 04/28/24 0818 SCD's Start: 04/10/24 1834 apixaban  (ELIQUIS ) tablet 5 mg    Code Status: Full Code Family Communication: Updated spouse present at bedside this morning  Disposition Plan:  Level of care: Med-Surg Status is: Inpatient Remains inpatient appropriate because: Antibiotics, await sign off of general surgery and urology, will need SNF placement    Consultants:  General Surgery Interventional radiology Urology Nephrology: signed off 7/4 PCCM Infectious disease, Dr. Dea  Procedures:  robotic LOA,  incisional/parastomal, left inguinal incarcerated hernia repair with mesh, urostomy ileal conduit revision, Dr. Sheldon, 5/30 drainage from right abdominal wall abscess, intra-abdominal abscess, small bowel resection, wound VAC; Dr. Tanda, 6/9 LOA, abdominal wall debridement, ileal resection, end ileostomy, abdominal wall hernia repairs with mesh, wound VAC placement, Dr. Sheldon; 6/10 bilateral PCN placed by interventional radiology, Dr. Jennefer, 6/23 bilateral nephrostomy tube exchange by IR, 6/30 Right percutaneous nephrostomy exchange with ureteral stent by IR, 7/2  Antimicrobials:  Vancomycin  6/2 - 6/4 Metronidazole  6/2 - 6/5, 6/8 - 6/8 Cefepime  6/2 - 6/8 Zosyn  6/9> Micafungin  6/9>>   Subjective: Patient seen examined bedside, lying in bed.  Spouse present.  Improved oral intake yesterday although continues with mild nausea.  Remains on IV antibiotics; end of treatment plan 05/18/2024.  No other issues or complaints, questions or concerns at this time.  Denies headache, no dizziness, no chest pain, no palpitations, no shortness of breath, no fever/chills/night sweats, no nausea/vomiting, no focal weakness, no fatigue, no paresthesia.  No acute events overnight per nursing staff.  Objective: Vitals:   05/16/24 0309 05/16/24 0737 05/16/24 0739 05/16/24 1133  BP: 123/73  115/75 116/77  Pulse:   (!) 101 (!) 109  Resp: 17  20 20   Temp: 97.7 F (36.5 C) (!) 97.5 F (36.4 C) (!) 97.5 F (36.4 C) (!) 97.5 F (36.4 C)  TempSrc: Oral  Oral Oral  SpO2:   98% 98%  Weight:      Height:        Intake/Output Summary (Last 24 hours) at 05/16/2024 1217 Last data filed at 05/16/2024 1045 Gross per 24 hour  Intake 1102.19 ml  Output 2825 ml  Net -1722.81 ml   Filed Weights   05/04/24 2200 05/06/24 0500 05/07/24 0500  Weight: 44.9 kg 100.4 kg 99.3 kg    Examination:  Physical Exam: GEN: NAD, alert and oriented x 3, ill in appearance HEENT: NCAT, PERRL, EOMI, sclera clear, MMM PULM: CTAB  w/o wheezes/crackles, normal respiratory effort, on room air CV: RRR w/o M/G/R GI:  abd soft, mild TTP surrounding wound VAC site, ostomy noted with stool in collection bag; urostomy noted with urine in collection bag, bilateral PCNs noted with clear urine. MSK: no peripheral edema, muscle strength globally intact 5/5 bilateral upper/lower extremities NEURO: CN II-XII intact, no focal deficits, sensation to light touch intact PSYCH: normal mood/affect Integumentary: Abdominal wound VAC site noted/urostomy, otherwise no other concerning rashes/lesions/wounds noted on component skin service.    Data Reviewed: I have personally reviewed following labs and imaging studies  CBC: Recent Labs  Lab 05/12/24 0412 05/13/24 0500 05/14/24 0315 05/15/24 0610 05/16/24 0326  WBC 9.3 11.0* 11.0* 8.8 8.8  HGB 11.4* 12.1* 11.7* 11.7* 12.9*  HCT 36.2* 37.4* 37.1* 36.8* 40.3  MCV 93.5 92.6 93.9 92.5 93.1  PLT 147* 191 196 221 232   Basic Metabolic Panel: Recent Labs  Lab 05/12/24 0412 05/13/24 0500 05/14/24 0315 05/15/24 0610 05/16/24 0326  NA 132* 131* 131* 132* 131*  K 4.3 4.5 4.2 3.8 4.0  CL 99 101 101 103 101  CO2 20* 16* 16* 19* 17*  GLUCOSE 157* 166* 116* 110* 114*  BUN 105* 107* 90* 77* 78*  CREATININE 2.60* 2.26* 2.15* 1.89* 2.11*  CALCIUM  9.8 10.3 10.0 9.7 10.2  MG  --  2.3  --   --   --   PHOS 6.9* 5.8*  5.8* 5.7* 5.0* 5.1*   GFR: Estimated Creatinine Clearance: 38.6 mL/min (A) (by C-G formula based on SCr of 2.11 mg/dL (H)). Liver Function Tests: Recent Labs  Lab 05/11/24 0000 05/11/24 0500 05/12/24 0412 05/13/24 0500 05/14/24 0315 05/15/24 0610 05/16/24 0326  AST 34  --   --   --   --   --   --   ALT 58*  --   --   --   --   --   --   ALKPHOS 93  --   --   --   --   --   --   BILITOT 0.7  --   --   --   --   --   --   PROT 7.0  --   --   --   --   --   --   ALBUMIN  3.0*   < > 3.0* 3.1* 3.1* 3.0* 3.3*   < > = values in this interval not displayed.   No results  for input(s): LIPASE, AMYLASE in the last 168 hours. No results for input(s): AMMONIA in the last 168 hours. Coagulation Profile: No results for input(s): INR, PROTIME in the last 168 hours. Cardiac Enzymes: No results for input(s): CKTOTAL, CKMB, CKMBINDEX, TROPONINI in the last 168 hours. BNP (last 3 results) No results for input(s): PROBNP in the last 8760 hours. HbA1C: No results for input(s): HGBA1C in the last 72 hours. CBG: Recent Labs  Lab 05/15/24 1928 05/15/24 2311 05/16/24 0316 05/16/24 0734 05/16/24 1131  GLUCAP 144* 114* 115* 121* 186*   Lipid Profile: No results for input(s): CHOL, HDL, LDLCALC, TRIG, CHOLHDL, LDLDIRECT in the last 72 hours. Thyroid  Function Tests: No results for input(s): TSH, T4TOTAL, FREET4, T3FREE, THYROIDAB in the last 72 hours. Anemia Panel: No results for input(s): VITAMINB12, FOLATE, FERRITIN, TIBC, IRON , RETICCTPCT in the last 72 hours. Sepsis Labs: No results for input(s): PROCALCITON, LATICACIDVEN in the last 168 hours.  No results found for this or any previous visit (from the past 240 hours).       Radiology Studies: No results found.  Scheduled Meds:  apixaban   5 mg Oral BID   Chlorhexidine  Gluconate Cloth  6 each Topical Daily   vitamin B-12  1,000 mcg Oral Daily   feeding supplement  237 mL Oral BID BM   feeding supplement (PROSource TF20)  60 mL Per Tube BID   insulin  aspart  0-20 Units Subcutaneous Q4H   insulin  glargine-yfgn  25 Units Subcutaneous QHS   melatonin  5 mg Oral QHS   metoCLOPramide   5 mg Oral TID AC & HS   multivitamin with minerals  1 tablet Oral Daily   nutrition supplement (JUVEN)  1 packet Oral BID BM   mouth rinse  15 mL Mouth Rinse 4 times per day   polycarbophil  625 mg Oral BID   rosuvastatin   10 mg Oral QPM   sodium chloride  flush  5 mL Intracatheter Q8H   Continuous Infusions:  sodium chloride  Stopped (05/01/24 0014)    albumin  human     anticoagulant sodium citrate      micafungin  (MYCAMINE ) 100 mg in sodium chloride  0.9 % 100 mL IVPB 100 mg (05/16/24 1049)   piperacillin -tazobactam (ZOSYN )  IV 3.375 g (05/16/24 0533)     LOS: 36 days    Time spent: 47 minutes spent on 05/16/2024 caring for this patient face-to-face including chart review, ordering labs/tests, documenting, discussion with nursing staff, consultants, updating family and interview/physical exam    Camellia PARAS [place], DO [facility] Available via Epic secure chat 7am-7pm After these hours, please refer to coverage provider listed on [email or web address] 05/16/2024, 12:17 PM

## 2024-05-17 DIAGNOSIS — K631 Perforation of intestine (nontraumatic): Secondary | ICD-10-CM | POA: Diagnosis not present

## 2024-05-17 LAB — GLUCOSE, CAPILLARY
Glucose-Capillary: 110 mg/dL — ABNORMAL HIGH (ref 70–99)
Glucose-Capillary: 110 mg/dL — ABNORMAL HIGH (ref 70–99)
Glucose-Capillary: 112 mg/dL — ABNORMAL HIGH (ref 70–99)
Glucose-Capillary: 117 mg/dL — ABNORMAL HIGH (ref 70–99)
Glucose-Capillary: 137 mg/dL — ABNORMAL HIGH (ref 70–99)
Glucose-Capillary: 155 mg/dL — ABNORMAL HIGH (ref 70–99)
Glucose-Capillary: 161 mg/dL — ABNORMAL HIGH (ref 70–99)
Glucose-Capillary: 176 mg/dL — ABNORMAL HIGH (ref 70–99)

## 2024-05-17 LAB — RENAL FUNCTION PANEL
Albumin: 3.1 g/dL — ABNORMAL LOW (ref 3.5–5.0)
Anion gap: 14 (ref 5–15)
BUN: 69 mg/dL — ABNORMAL HIGH (ref 8–23)
CO2: 17 mmol/L — ABNORMAL LOW (ref 22–32)
Calcium: 9.8 mg/dL (ref 8.9–10.3)
Chloride: 97 mmol/L — ABNORMAL LOW (ref 98–111)
Creatinine, Ser: 1.92 mg/dL — ABNORMAL HIGH (ref 0.61–1.24)
GFR, Estimated: 37 mL/min — ABNORMAL LOW (ref >60)
Glucose, Bld: 105 mg/dL — ABNORMAL HIGH (ref 70–99)
Phosphorus: 4.8 mg/dL — ABNORMAL HIGH (ref 2.5–4.6)
Potassium: 3.6 mmol/L (ref 3.5–5.1)
Sodium: 128 mmol/L — ABNORMAL LOW (ref 135–145)

## 2024-05-17 LAB — CREATININE, FLUID (PLEURAL, PERITONEAL, JP DRAINAGE): Creat, Fluid: >50 mg/dL

## 2024-05-17 MED ORDER — ORAL CARE MOUTH RINSE: 15.0000 mL | OROMUCOSAL | Status: DC | PRN

## 2024-05-17 NOTE — Plan of Care (Signed)
  Problem: Education: Goal: Knowledge of General Education information will improve Description: Including pain rating scale, medication(s)/side effects and non-pharmacologic comfort measures Outcome: Progressing   Problem: Health Behavior/Discharge Planning: Goal: Ability to manage health-related needs will improve Outcome: Progressing   Problem: Clinical Measurements: Goal: Ability to maintain clinical measurements within normal limits will improve Outcome: Progressing Goal: Will remain free from infection Outcome: Progressing Goal: Diagnostic test results will improve Outcome: Progressing Goal: Respiratory complications will improve Outcome: Progressing Goal: Cardiovascular complication will be avoided Outcome: Progressing   Problem: Nutrition: Goal: Adequate nutrition will be maintained Outcome: Progressing   Problem: Elimination: Goal: Will not experience complications related to bowel motility Outcome: Progressing   Problem: Pain Managment: Goal: General experience of comfort will improve and/or be controlled Outcome: Progressing   Problem: Safety: Goal: Ability to remain free from injury will improve Outcome: Progressing

## 2024-05-17 NOTE — Progress Notes (Signed)
 PROGRESS NOTE    Kristopher Thompson  FMW:[id number] DOB: [birth year] DOA: 04/10/2024 PCP: [name], MD    Brief Narrative:   Kristopher Thompson is a 73 y.o. male with past medical history significant for HTN, chronic diastolic congestive heart failure, CAD, DM2, pulmonary embolism on Eliquis , anemia, obesity, prostate cancer, bladder cancer who was initially admitted by the general surgery service on 5/30 for operative management regarding parastomal and incisional incarcerated abdominal wall hernias; requiring extensive lysis, mesh insertion, repair, urostomy ileal conduit revision thereafter admitted to the ICU due to tachycardia and tachypnea. Initially placed on norepinephrine  and an arterial line was placed. Patient declined NG tube placement but eventually agreed. Patient was taken back to the OR on 6/9 due to perforated bowel and postop shock. Required multiple pressors, steroids and PRBC transfusion after washout/closure in the OR. On 6/11 developed AKI requiring dialysis.. Patient was extubated on 6/14.   Significant hospital events/procedures: 5/30: Admit to general surgery, robotic LOA, incisional/parastomal, left inguinal incarcerated hernia repair with mesh, urostomy ileal conduit revision, Dr. Sheldon 6/2: CCM consult and vasopressor 6/4: off NE. Pulled out his NGT refused replacement. After multiple emesis  6/5: NG tube replaced. 6/6: transferred to MedSurg. 6/9: OR for perforated bowel, drainage from right abdominal wall abscess, intra-abdominal abscess, small bowel resection, wound VAC; Dr. Tanda 6/10: NE, Phenylephrine , vasopressin , steroids, 1u PRBCs, back to OR for LOA, abdominal wall debridement, ileal resection, end ileostomy abdominal wall hernia repairs with mesh, fascial closure, wound VAC, Dr. Sheldon 6/11: Worsening AKI with fluid overload, start CRRT, decreasing pressor requirements 6/14: Off CRRT, arousable.  Extubated. 6/15: transferred to [facility] for iHD; montoring  for renal recovery 6/16: iHD, severely encephalopathic with high BUN 6/17: restarted CRRT for to ongoing uremia and encephalopathy 6/18: was awake enough to work with PT, OT 6/23: bilateral PCN placed by interventional radiology, Dr. Jennefer 6/24: MS imrpoving, Neph tubes in and draining 6/30: bilateral nephrostomy tube exchange by IR 7/2: Right percutaneous nephrostomy exchange with ureteral stent by IR, cortrak removed  Assessment & Plan:    Incisional/parastomal, left inguinal incarcerated hernia s/p robotic hernia repair Septic shock secondary to acute peritonitis from bowel perforation Patient presenting for surgical repair of incisional/parastomal and left inguinal incarcerated hernia and underwent robotic LOA with mesh placement and urostomy ileal conduit revision by Dr. Sheldon on 04/10/2024.  Postoperatively patient required vasopressors and was transferred to the ICU.  Subsequent return to the OR on 04/20/2024 for bowel perforation underwent I&D of abdominal wall/intra-abdominal abscess with small bowel resection.  Returned to OR on 6/10 for further abdominal wall debridement, LOA, ileal resection, end ileostomy with abdominal wall hernia repairs with mesh, fascial closure with wound VAC.  Seen by infectious disease, Dr. Dea; with recommendation of 4-6-week treatment given mesh placed in a contaminated field with EOT 05/18/2024. -- Surgery following, appreciate assistance -- Zosyn  3.375 g IV every 8 hours -- Micafungin  100 mg IV every 24 hours -- Per infectious disease, EOT 05/18/2024 -- Wound VAC changes twice weekly Monday/Thursday -- Further per general surgery  Acute renal failure secondary to ischemic ATN/shock Bilateral hydronephrosis/obstruction -- Nephrology, urology following, appreciate assistance -- Cr (0.97 04/01/2024), 2.08>>0.84>>3.70>>1.75>3.30>>2.61>2.60>2.26>2.15>1.89>2.11>1.92 -- Required CRRT followed by HD while inpatient, last HD 6/23 -- S/p bilateral PCN on 6/23,  exchanged 6/30; right exchanged 7/2  Urine leak w/ Hx prior radical cystectomy/ileal conduit urinary diversion CT loopogram 7/1 with finding of urine leak identified at inferior butt end of ileal conduit. Right percutaneous nephrostomy exchange with ureteral  stent 7/2 by IR -- Further per urology and interventional radiology  Type 2 diabetes mellitus On metformin 500 mg p.o. daily outpatient.  Hemoglobin A1c 5.3 on 04/01/2024. -- Semglee  25u [place] at bedtime -- Resistant SSI for coverage  Acute blood loss anemia Right flank hematoma Anemia of chronic medical disease Anemia panel with iron  54, TIBC 281, ferritin 588 -- Hgb 12.5>>7.5>>9.9>>10.9>11.4>12.1>11.7>12.9, stable  Chronic diastolic congestive heart failure HTN -- Hydralazine  20 mg IV every 4 hours as needed SBP greater than 160  HLD -- Crestor  10 mg p.o. nightly  History of pulmonary embolism On Eliquis  at baseline. -- Heparin  drip discontinued 7/3 and restarted on Eliquis  5 mg p.o. twice daily  Moderate protein calorie malnutrition Obesity, class I Body mass index is 30.53 kg/m. Nutrition Status: Nutrition Problem: Increased nutrient needs Etiology: acute illness Signs/Symptoms: estimated needs Interventions: Tube feeding, Prostat, Juven  Weakness/ability/deconditioning: -- PT/OT currently recommends CIR -- Continue therapy efforts while inpatient   DVT prophylaxis: Place TED hose Start: 04/28/24 0818 SCD's Start: 04/10/24 1834 apixaban  (ELIQUIS ) tablet 5 mg    Code Status: Full Code Family Communication: Updated spouse present at bedside this morning  Disposition Plan:  Level of care: Med-Surg Status is: Inpatient Remains inpatient appropriate because: Antibiotics, await sign off of general surgery and urology, will need SNF placement    Consultants:  General Surgery Interventional radiology Urology Nephrology: signed off 7/4 PCCM Infectious disease, Dr. Dea  Procedures:  robotic LOA,  incisional/parastomal, left inguinal incarcerated hernia repair with mesh, urostomy ileal conduit revision, Dr. Sheldon, 5/30 drainage from right abdominal wall abscess, intra-abdominal abscess, small bowel resection, wound VAC; Dr. Tanda, 6/9 LOA, abdominal wall debridement, ileal resection, end ileostomy, abdominal wall hernia repairs with mesh, wound VAC placement, Dr. Sheldon; 6/10 bilateral PCN placed by interventional radiology, Dr. Jennefer, 6/23 bilateral nephrostomy tube exchange by IR, 6/30 Right percutaneous nephrostomy exchange with ureteral stent by IR, 7/2  Antimicrobials:  Vancomycin  6/2 - 6/4 Metronidazole  6/2 - 6/5, 6/8 - 6/8 Cefepime  6/2 - 6/8 Zosyn  6/9> Micafungin  6/9>>   Subjective: Patient seen examined bedside, lying in bed.  Spouse present.  Patient reportedly orts increased oral intake yesterday.  Continues with intermittent nausea.  Last day of antibiotics planned for tomorrow per ID recommendations.  No other issues or complaints, questions or concerns at this time.  Denies headache, no dizziness, no chest pain, no palpitations, no shortness of breath, no fever/chills/night sweats, no nausea/vomiting, no focal weakness, no fatigue, no paresthesia.  No acute events overnight per nursing staff.  Pending SNF placement.  Objective: Vitals:   05/16/24 2035 05/17/24 0025 05/17/24 0418 05/17/24 0700  BP:  114/76 102/71 107/72  Pulse:  98 98 94  Resp: 20  18 14   Temp: 97.8 F (36.6 C)  97.6 F (36.4 C) (!) 97.4 F (36.3 C)  TempSrc: Oral  Oral Axillary  SpO2:  96% 97% 98%  Weight:      Height:        Intake/Output Summary (Last 24 hours) at 05/17/2024 1047 Last data filed at 05/17/2024 1020 Gross per 24 hour  Intake 895.48 ml  Output 1750 ml  Net -854.52 ml   Filed Weights   05/04/24 2200 05/06/24 0500 05/07/24 0500  Weight: 44.9 kg 100.4 kg 99.3 kg    Examination:  Physical Exam: GEN: NAD, alert and oriented x 3, ill in appearance HEENT: NCAT, PERRL, EOMI,  sclera clear, MMM PULM: CTAB w/o wheezes/crackles, normal respiratory effort, on room air CV: RRR w/o M/G/R  GI: abd soft, mild TTP surrounding wound VAC site, ostomy noted with stool in collection bag; urostomy noted with urine in collection bag, bilateral PCNs noted with clear urine. MSK: no peripheral edema, muscle strength globally intact 5/5 bilateral upper/lower extremities NEURO: CN II-XII intact, no focal deficits, sensation to light touch intact PSYCH: normal mood/affect Integumentary: Abdominal wound VAC site noted/urostomy, otherwise no other concerning rashes/lesions/wounds noted on component skin service.    Data Reviewed: I have personally reviewed following labs and imaging studies  CBC: Recent Labs  Lab 05/12/24 0412 05/13/24 0500 05/14/24 0315 05/15/24 0610 05/16/24 0326  WBC 9.3 11.0* 11.0* 8.8 8.8  HGB 11.4* 12.1* 11.7* 11.7* 12.9*  HCT 36.2* 37.4* 37.1* 36.8* 40.3  MCV 93.5 92.6 93.9 92.5 93.1  PLT 147* 191 196 221 232   Basic Metabolic Panel: Recent Labs  Lab 05/13/24 0500 05/14/24 0315 05/15/24 0610 05/16/24 0326 05/17/24 0552  NA 131* 131* 132* 131* 128*  K 4.5 4.2 3.8 4.0 3.6  CL 101 101 103 101 97*  CO2 16* 16* 19* 17* 17*  GLUCOSE 166* 116* 110* 114* 105*  BUN 107* 90* 77* 78* 69*  CREATININE 2.26* 2.15* 1.89* 2.11* 1.92*  CALCIUM  10.3 10.0 9.7 10.2 9.8  MG 2.3  --   --   --   --   PHOS 5.8*  5.8* 5.7* 5.0* 5.1* 4.8*   GFR: Estimated Creatinine Clearance: 42.4 mL/min (A) (by C-G formula based on SCr of 1.92 mg/dL (H)). Liver Function Tests: Recent Labs  Lab 05/11/24 0000 05/11/24 0500 05/13/24 0500 05/14/24 0315 05/15/24 0610 05/16/24 0326 05/17/24 0552  AST 34  --   --   --   --   --   --   ALT 58*  --   --   --   --   --   --   ALKPHOS 93  --   --   --   --   --   --   BILITOT 0.7  --   --   --   --   --   --   PROT 7.0  --   --   --   --   --   --   ALBUMIN  3.0*   < > 3.1* 3.1* 3.0* 3.3* 3.1*   < > = values in this interval  not displayed.   No results for input(s): LIPASE, AMYLASE in the last 168 hours. No results for input(s): AMMONIA in the last 168 hours. Coagulation Profile: No results for input(s): INR, PROTIME in the last 168 hours. Cardiac Enzymes: No results for input(s): CKTOTAL, CKMB, CKMBINDEX, TROPONINI in the last 168 hours. BNP (last 3 results) No results for input(s): PROBNP in the last 8760 hours. HbA1C: No results for input(s): HGBA1C in the last 72 hours. CBG: Recent Labs  Lab 05/16/24 1541 05/16/24 2031 05/17/24 0022 05/17/24 0416 05/17/24 0724  GLUCAP 123* 127* 110* 112* 110*   Lipid Profile: No results for input(s): CHOL, HDL, LDLCALC, TRIG, CHOLHDL, LDLDIRECT in the last 72 hours. Thyroid  Function Tests: No results for input(s): TSH, T4TOTAL, FREET4, T3FREE, THYROIDAB in the last 72 hours. Anemia Panel: No results for input(s): VITAMINB12, FOLATE, FERRITIN, TIBC, IRON , RETICCTPCT in the last 72 hours. Sepsis Labs: No results for input(s): PROCALCITON, LATICACIDVEN in the last 168 hours.  No results found for this or any previous visit (from the past 240 hours).       Radiology Studies: No results found.  Scheduled Meds:  apixaban   5 mg Oral BID   Chlorhexidine  Gluconate Cloth  6 each Topical Daily   vitamin B-12  1,000 mcg Oral Daily   feeding supplement  237 mL Oral BID BM   feeding supplement (PROSource TF20)  60 mL Per Tube BID   insulin  aspart  0-20 Units Subcutaneous Q4H   insulin  glargine-yfgn  25 Units Subcutaneous QHS   melatonin  5 mg Oral QHS   multivitamin with minerals  1 tablet Oral Daily   nutrition supplement (JUVEN)  1 packet Oral BID BM   mouth rinse  15 mL Mouth Rinse 4 times per day   polycarbophil  625 mg Oral BID   rosuvastatin   10 mg Oral QPM   sodium chloride  flush  5 mL Intracatheter Q8H   Continuous Infusions:  sodium chloride  Stopped (05/01/24 0014)   albumin   human     anticoagulant sodium citrate      micafungin  (MYCAMINE ) 100 mg in sodium chloride  0.9 % 100 mL IVPB 105 mL/hr at 05/17/24 1020   piperacillin -tazobactam (ZOSYN )  IV Stopped (05/17/24 0935)     LOS: 37 days    Time spent: 47 minutes spent on 05/17/2024 caring for this patient face-to-face including chart review, ordering labs/tests, documenting, discussion with nursing staff, consultants, updating family and interview/physical exam    Camellia PARAS [place], DO [facility] Available via Epic secure chat 7am-7pm After these hours, please refer to coverage provider listed on [email or web address] 05/17/2024, 10:47 AM

## 2024-05-17 NOTE — Progress Notes (Signed)
  26 Days Post-Op   Chief Complaint/Subjective: No new pain issues, tolerating diet well, working with therapies  Objective: Vital signs in last 24 hours: Temp:  [97.4 F (36.3 C)-97.8 F (36.6 C)] 97.6 F (36.4 C) (07/06 1100) Pulse Rate:  [94-109] 98 (07/06 1100) Resp:  [14-20] 14 (07/06 1100) BP: (102-116)/(71-77) 102/76 (07/06 1100) SpO2:  [94 %-98 %] 94 % (07/06 1100) Last BM Date : 05/16/24 Intake/Output from previous day: 07/05 0701 - 07/06 0700 In: 1282.9 [P.O.:960; I.V.:5; IV Piggyback:262.9] Out: 2375 [Urine:1150; Drains:350; Stool:875]  PE: Gen: NAD Resp: nonlabored Card: RRR Abd: soft, ostomy functional, drains in place with minimal serous output  Lab Results:  Recent Labs    05/15/24 0610 05/16/24 0326  WBC 8.8 8.8  HGB 11.7* 12.9*  HCT 36.8* 40.3  PLT 221 232   Recent Labs    05/16/24 0326 05/17/24 0552  NA 131* 128*  K 4.0 3.6  CL 101 97*  CO2 17* 17*  GLUCOSE 114* 105*  BUN 78* 69*  CREATININE 2.11* 1.92*  CALCIUM  10.2 9.8   No results for input(s): LABPROT, INR in the last 72 hours.    Component Value Date/Time   NA 128 (L) 05/17/2024 0552   K 3.6 05/17/2024 0552   CL 97 (L) 05/17/2024 0552   CO2 17 (L) 05/17/2024 0552   GLUCOSE 105 (H) 05/17/2024 0552   BUN 69 (H) 05/17/2024 0552   CREATININE 1.92 (H) 05/17/2024 0552   CALCIUM  9.8 05/17/2024 0552   PROT 7.0 05/11/2024 0000   ALBUMIN  3.1 (L) 05/17/2024 0552   AST 34 05/11/2024 0000   ALT 58 (H) 05/11/2024 0000   ALKPHOS 93 05/11/2024 0000   BILITOT 0.7 05/11/2024 0000   GFRNONAA 37 (L) 05/17/2024 0552   GFRAA >60 12/09/2018 0549    Assessment/Plan -Incarcerated parastomal incisional hernias with obstructive symptoms status post robotic takedown and repair -Delayed small bowel perforation s/p ileal resection and end ileostomy -Delayed urine leak of urostomy ileal conduit status post perc nephrostomy tubes -Multisystem organ failure gradually resolving  FEN - reg  diet VTE - eliquis  ID -zosyn  6/23-->, mycafungin 6/20--> Disposition - inpatient   LOS: 37 days   I reviewed last 24 h vitals and pain scores, last 48 h intake and output, last 24 h labs and trends, and last 24 h imaging results.  This care required straight-forward level of medical decision making.   Herlene Righter [facility] [place] 05/17/2024, 11:23 AM Please see Amion for pager number during day hours 7:00am-4:30pm or 7:00am -11:30am on weekends

## 2024-05-18 DIAGNOSIS — K631 Perforation of intestine (nontraumatic): Secondary | ICD-10-CM | POA: Diagnosis not present

## 2024-05-18 LAB — COMPREHENSIVE METABOLIC PANEL WITH GFR
ALT: 74 U/L — ABNORMAL HIGH (ref 0–44)
AST: 46 U/L — ABNORMAL HIGH (ref 15–41)
Albumin: 3.1 g/dL — ABNORMAL LOW (ref 3.5–5.0)
Alkaline Phosphatase: 85 U/L (ref 38–126)
Anion gap: 13 (ref 5–15)
BUN: 73 mg/dL — ABNORMAL HIGH (ref 8–23)
CO2: 17 mmol/L — ABNORMAL LOW (ref 22–32)
Calcium: 9.7 mg/dL (ref 8.9–10.3)
Chloride: 98 mmol/L (ref 98–111)
Creatinine, Ser: 2.08 mg/dL — ABNORMAL HIGH (ref 0.61–1.24)
GFR, Estimated: 33 mL/min — ABNORMAL LOW (ref >60)
Glucose, Bld: 136 mg/dL — ABNORMAL HIGH (ref 70–99)
Potassium: 4 mmol/L (ref 3.5–5.1)
Sodium: 128 mmol/L — ABNORMAL LOW (ref 135–145)
Total Bilirubin: 0.5 mg/dL (ref 0.0–1.2)
Total Protein: 7.4 g/dL (ref 6.5–8.1)

## 2024-05-18 LAB — GLUCOSE, CAPILLARY
Glucose-Capillary: 109 mg/dL — ABNORMAL HIGH (ref 70–99)
Glucose-Capillary: 105 mg/dL — ABNORMAL HIGH (ref 70–99)
Glucose-Capillary: 150 mg/dL — ABNORMAL HIGH (ref 70–99)
Glucose-Capillary: 131 mg/dL — ABNORMAL HIGH (ref 70–99)
Glucose-Capillary: 158 mg/dL — ABNORMAL HIGH (ref 70–99)
Glucose-Capillary: 152 mg/dL — ABNORMAL HIGH (ref 70–99)

## 2024-05-18 LAB — CBC
HCT: 39 % (ref 39.0–52.0)
Hemoglobin: 13.1 g/dL (ref 13.0–17.0)
MCH: 30.5 pg (ref 26.0–34.0)
MCHC: 33.6 g/dL (ref 30.0–36.0)
MCV: 90.9 fL (ref 80.0–100.0)
Platelets: 313 K/uL (ref 150–400)
RBC: 4.29 MIL/uL (ref 4.22–5.81)
RDW: 17.4 % — ABNORMAL HIGH (ref 11.5–15.5)
WBC: 10.4 K/uL (ref 4.0–10.5)
nRBC: 0 % (ref 0.0–0.2)

## 2024-05-18 LAB — RENAL FUNCTION PANEL
Albumin: 3.1 g/dL — ABNORMAL LOW (ref 3.5–5.0)
Anion gap: 12 (ref 5–15)
BUN: 57 mg/dL — ABNORMAL HIGH (ref 8–23)
CO2: 17 mmol/L — ABNORMAL LOW (ref 22–32)
Calcium: 9.7 mg/dL (ref 8.9–10.3)
Chloride: 101 mmol/L (ref 98–111)
Creatinine, Ser: 1.72 mg/dL — ABNORMAL HIGH (ref 0.61–1.24)
GFR, Estimated: 42 mL/min — ABNORMAL LOW (ref >60)
Glucose, Bld: 103 mg/dL — ABNORMAL HIGH (ref 70–99)
Phosphorus: 4.8 mg/dL — ABNORMAL HIGH (ref 2.5–4.6)
Potassium: 3.7 mmol/L (ref 3.5–5.1)
Sodium: 130 mmol/L — ABNORMAL LOW (ref 135–145)

## 2024-05-18 LAB — PREALBUMIN: Prealbumin: 38 mg/dL (ref 18–38)

## 2024-05-18 NOTE — Progress Notes (Signed)
   05/17/24 2040  BiPAP/CPAP/SIPAP  BiPAP/CPAP/SIPAP Pt Type Adult  BiPAP/CPAP/SIPAP Resmed  Patient Home Machine Yes  Safety Check Completed by RT for Home Unit Yes, no issues noted  Patient Home Mask Yes  Patient Home Tubing Yes  CPAP/SIPAP surface wiped down Yes  Device Plugged into RED Power Outlet Yes  BiPAP/CPAP /SiPAP Vitals  Pulse Rate (!) 101  Resp 18  Bilateral Breath Sounds Clear;Diminished   Pt places self on home unit. Pt instructed to call if needs assistance.

## 2024-05-18 NOTE — Progress Notes (Signed)
 Physical Therapy Treatment Patient Details Name: Kristopher Thompson MRN: [id number] DOB: Jan 03, 1951 Today's Date: 05/18/2024   History of Present Illness 73 y/o M admitted to [facility] 5/30 for hernia repair and urostomy revision. 6/9 to OR for perforated bowel, post op shock, s/p exploratory lapartomy with drainage of Rt abdominal wall abscess. CRRT 6/11-6/14. Extubated 6/14, Transferred to [facility] 6/15 for intermittent HD. CRRT again 6/17-6/20. 6/23 bil perc nephrostomy tubes placed. PMHx: bladder and prostate CA, RBBB, CHF, colostomy, DM, GERD, abdominal surgery.    PT Comments  Pt requiring min encouragement to progress mobility this date, states I can't walk. Pt tolerating x2 transfers into standing and short-distance gait in room, pt endorsing less fatigue and DOE with standing and gait. Pt progressing slowly, but well. Session terminated slightly early due to wound care RN arriving to room to change ostomies and wound vac. PT to continue to follow, plan remains appropriate.   Of note, pt endorsing room-spinning dizziness with return to supine, may benefit from assessing vestibular next session.     If plan is discharge home, recommend the following: Assistance with cooking/housework;Assist for transportation;Help with stairs or ramp for entrance;Supervision due to cognitive status;A lot of help with walking and/or transfers;A lot of help with bathing/dressing/bathroom   Can travel by private vehicle        Equipment Recommendations  Rolling walker (2 wheels);BSC/3in1    Recommendations for Other Services       Precautions / Restrictions Precautions Precautions: Fall Precaution/Restrictions Comments: R drain (L drain d/c 7/7), ileostomy, urostomy, abd wound vac (disconnected at time of treatment today, wound care to redo after session), bil perc nephrostomy drains Restrictions Weight Bearing Restrictions Per Provider Order: No     Mobility  Bed Mobility Overal bed mobility: Needs  Assistance Bed Mobility: Rolling, Sidelying to Sit, Sit to Sidelying Rolling: Min assist Sidelying to sit: Min assist     Sit to sidelying: Min assist, +2 for physical assistance General bed mobility comments: assist for trunk and LE management, sequencing cues to get to EOB. room-spinning dizzy upon return to supine.    Transfers Overall transfer level: Needs assistance Equipment used: Rolling walker (2 wheels) Transfers: Sit to/from Stand, Bed to chair/wheelchair/BSC Sit to Stand: Min assist   Step pivot transfers: Min assist       General transfer comment: assist for initial rise and steadying upon standing, stand x2 from EOB and recliner.    Ambulation/Gait Ambulation/Gait assistance: Min assist, +2 safety/equipment Gait Distance (Feet): 5 Feet Assistive device: Rolling walker (2 wheels) Gait Pattern/deviations: Shuffle, Trunk flexed, Step-through pattern, Decreased stride length Gait velocity: decr     General Gait Details: assist to steady, close chair follow for pt comfort.   Stairs             Wheelchair Mobility     Tilt Bed    Modified Rankin (Stroke Patients Only)       Balance Overall balance assessment: Needs assistance Sitting-balance support: Feet supported Sitting balance-Leahy Scale: Good     Standing balance support: Reliant on assistive device for balance, Bilateral upper extremity supported Standing balance-Leahy Scale: Poor Standing balance comment: RW                            Communication Communication Communication: No apparent difficulties Factors Affecting Communication: Hearing impaired  Cognition Arousal: Alert Behavior During Therapy: Flat affect   PT - Cognitive impairments: Safety/Judgement, Initiation  PT - Cognition Comments: can be self-limiting, needs encouragement to progress Following commands: Intact Following commands impaired: Only follows one step commands  consistently    Cueing Cueing Techniques: Verbal cues  Exercises      General Comments        Pertinent Vitals/Pain Pain Assessment Pain Assessment: Faces Faces Pain Scale: Hurts even more Pain Location: abdomen Pain Descriptors / Indicators: Discomfort, Sore Pain Intervention(s): Limited activity within patient's tolerance, Monitored during session, Repositioned    Home Living                          Prior Function            PT Goals (current goals can now be found in the care plan section) Acute Rehab PT Goals Patient Stated Goal: Regain independence PT Goal Formulation: With patient Time For Goal Achievement: 05/27/24 Potential to Achieve Goals: Good Progress towards PT goals: Progressing toward goals    Frequency    Min 2X/week      PT Plan      Co-evaluation              AM-PAC PT 6 Clicks Mobility   Outcome Measure  Help needed turning from your back to your side while in a flat bed without using bedrails?: A Little Help needed moving from lying on your back to sitting on the side of a flat bed without using bedrails?: A Little Help needed moving to and from a bed to a chair (including a wheelchair)?: A Little Help needed standing up from a chair using your arms (e.g., wheelchair or bedside chair)?: A Little Help needed to walk in hospital room?: A Lot Help needed climbing 3-5 steps with a railing? : Total 6 Click Score: 15    End of Session   Activity Tolerance: Patient limited by fatigue Patient left: in bed;with bed alarm set;with call bell/phone within reach;with family/visitor present Nurse Communication: Mobility status PT Visit Diagnosis: Unsteadiness on feet (R26.81);Other abnormalities of gait and mobility (R26.89);Muscle weakness (generalized) (M62.81);Difficulty in walking, not elsewhere classified (R26.2)     Time: 9048-8986 PT Time Calculation (min) (ACUTE ONLY): 22 min  Charges:    $Therapeutic Activity:  8-22 mins PT General Charges $$ ACUTE PT VISIT: 1 Visit                     Johana RAMAN, PT DPT Acute Rehabilitation Services Secure Chat Preferred  Office [phone]    Antuane Eastridge E Johna 05/18/2024, 10:27 AM

## 2024-05-18 NOTE — Progress Notes (Signed)
 PROGRESS NOTE    Kristopher Thompson  FMW:[id number] DOB: 09/24/1951 DOA: 04/10/2024 PCP: [name], MD    Brief Narrative:   Kristopher Thompson is a 73 y.o. male with past medical history significant for HTN, chronic diastolic congestive heart failure, CAD, DM2, pulmonary embolism on Eliquis , anemia, obesity, prostate cancer, bladder cancer who was initially admitted by the general surgery service on 5/30 for operative management regarding parastomal and incisional incarcerated abdominal wall hernias; requiring extensive lysis, mesh insertion, repair, urostomy ileal conduit revision thereafter admitted to the ICU due to tachycardia and tachypnea. Initially placed on norepinephrine  and an arterial line was placed. Patient declined NG tube placement but eventually agreed. Patient was taken back to the OR on 6/9 due to perforated bowel and postop shock. Required multiple pressors, steroids and PRBC transfusion after washout/closure in the OR. On 6/11 developed AKI requiring dialysis.. Patient was extubated on 6/14.   Significant hospital events/procedures: 5/30: Admit to general surgery, robotic LOA, incisional/parastomal, left inguinal incarcerated hernia repair with mesh, urostomy ileal conduit revision, Dr. Sheldon 6/2: CCM consult and vasopressor 6/4: off NE. Pulled out his NGT refused replacement. After multiple emesis  6/5: NG tube replaced. 6/6: transferred to MedSurg. 6/9: OR for perforated bowel, drainage from right abdominal wall abscess, intra-abdominal abscess, small bowel resection, wound VAC; Dr. Tanda 6/10: NE, Phenylephrine , vasopressin , steroids, 1u PRBCs, back to OR for LOA, abdominal wall debridement, ileal resection, end ileostomy abdominal wall hernia repairs with mesh, fascial closure, wound VAC, Dr. Sheldon 6/11: Worsening AKI with fluid overload, start CRRT, decreasing pressor requirements 6/14: Off CRRT, arousable.  Extubated. 6/15: transferred to [facility] for iHD; montoring  for renal recovery 6/16: iHD, severely encephalopathic with high BUN 6/17: restarted CRRT for to ongoing uremia and encephalopathy 6/18: was awake enough to work with PT, OT 6/23: bilateral PCN placed by interventional radiology, Dr. Jennefer 6/24: MS imrpoving, Neph tubes in and draining 6/30: bilateral nephrostomy tube exchange by IR 7/2: Right percutaneous nephrostomy exchange with ureteral stent by IR, cortrak removed 7/7: Oral intake increased, completing micafungin /Zosyn  today  Assessment & Plan:    Incisional/parastomal, left inguinal incarcerated hernia s/p robotic hernia repair Septic shock secondary to acute peritonitis from bowel perforation Patient presenting for surgical repair of incisional/parastomal and left inguinal incarcerated hernia and underwent robotic LOA with mesh placement and urostomy ileal conduit revision by Dr. Sheldon on 04/10/2024.  Postoperatively patient required vasopressors and was transferred to the ICU.  Subsequent return to the OR on 04/20/2024 for bowel perforation underwent I&D of abdominal wall/intra-abdominal abscess with small bowel resection.  Returned to OR on 6/10 for further abdominal wall debridement, LOA, ileal resection, end ileostomy with abdominal wall hernia repairs with mesh, fascial closure with wound VAC.  Seen by infectious disease, Dr. Dea; with recommendation of 4-6-week treatment given mesh placed in a contaminated field with EOT 05/18/2024. -- Surgery following, appreciate assistance -- Zosyn  3.375 g IV every 8 hours -- Micafungin  100 mg IV every 24 hours -- Per infectious disease, EOT 05/18/2024 -- Wound VAC changes twice weekly Monday/Thursday -- Further per general surgery  Acute renal failure secondary to ischemic ATN/shock Bilateral hydronephrosis/obstruction -- Nephrology, urology following, appreciate assistance -- Cr (0.97 04/01/2024), 2.08>>0.84>>3.70>>1.75>3.30>>2.61>2.60>2.26>2.15>1.89>2.11>1.92>1.1.72 -- Required CRRT  followed by HD while inpatient, last HD 6/23 -- S/p bilateral PCN on 6/23, exchanged 6/30; right exchanged 7/2  Urine leak w/ Hx prior radical cystectomy/ileal conduit urinary diversion CT loopogram 7/1 with finding of urine leak identified at inferior butt end of ileal  conduit. Right percutaneous nephrostomy exchange with ureteral stent 7/2 by IR -- Further per urology and interventional radiology  Type 2 diabetes mellitus On metformin 500 mg p.o. daily outpatient.  Hemoglobin A1c 5.3 on 04/01/2024. -- Semglee  25u [place] at bedtime -- Resistant SSI for coverage  Acute blood loss anemia Right flank hematoma Anemia of chronic medical disease Anemia panel with iron  54, TIBC 281, ferritin 588 -- Hgb 12.5>>7.5>>9.9>>10.9>11.4>12.1>11.7>12.9, stable  Chronic diastolic congestive heart failure HTN -- Hydralazine  20 mg IV every 4 hours as needed SBP greater than 160  HLD -- Crestor  10 mg p.o. nightly  History of pulmonary embolism -- Eliquis  5 mg p.o. twice daily  Moderate protein calorie malnutrition Obesity, class I Body mass index is 30.53 kg/m. Nutrition Status: Nutrition Problem: Increased nutrient needs Etiology: acute illness Signs/Symptoms: estimated needs Interventions: Tube feeding, Prostat, Juven  Weakness/ability/deconditioning: -- PT/OT currently recommends CIR -- Continue therapy efforts while inpatient   DVT prophylaxis: Place TED hose Start: 04/28/24 0818 SCD's Start: 04/10/24 1834 apixaban  (ELIQUIS ) tablet 5 mg    Code Status: Full Code Family Communication: Updated spouse present at bedside this morning  Disposition Plan:  Level of care: Med-Surg Status is: Inpatient Remains inpatient appropriate because: Antibiotics, await sign off of general surgery and urology, will need SNF placement    Consultants:  General Surgery Interventional radiology Urology Nephrology: signed off 7/4 PCCM Infectious disease, Dr. Dea  Procedures:  robotic LOA,  incisional/parastomal, left inguinal incarcerated hernia repair with mesh, urostomy ileal conduit revision, Dr. Sheldon, 5/30 drainage from right abdominal wall abscess, intra-abdominal abscess, small bowel resection, wound VAC; Dr. Tanda, 6/9 LOA, abdominal wall debridement, ileal resection, end ileostomy, abdominal wall hernia repairs with mesh, wound VAC placement, Dr. Sheldon; 6/10 bilateral PCN placed by interventional radiology, Dr. Jennefer, 6/23 bilateral nephrostomy tube exchange by IR, 6/30 Right percutaneous nephrostomy exchange with ureteral stent by IR, 7/2  Antimicrobials:  Vancomycin  6/2 - 6/4 Metronidazole  6/2 - 6/5, 6/8 - 6/8 Cefepime  6/2 - 6/8 Zosyn  6/9 - 7/7 Micafungin  6/9 - 7/7   Subjective: Patient seen examined bedside, lying in bed.  Spouse present.  Eating breakfast, less nausea today and increased oral intake.  Seen by urology, Dr. Renda this morning.  Will complete IV antibiotics with micafungin  and Zosyn  today.  Discussed will now be ready for SNF placement, spouse seems hesitant spouse reports hesitation due to all the different issues going on with her husband.  Discussed if the surgeons have no other recommendations or procedures planned he will be stable for discharge now that he has completed his antibiotics later today.  No other issues, complaints, questions or concerns at this time.  Denies headache, no dizziness, no chest pain, no palpitations, no shortness of breath, no fever/chills/night sweats, no nausea/vomiting, no focal weakness, no fatigue, no paresthesia.  No acute events overnight per nursing staff.  Pending SNF placement.  Objective: Vitals:   05/17/24 2040 05/17/24 2316 05/18/24 0310 05/18/24 0754  BP:  109/70 114/73 106/81  Pulse: (!) 101   94  Resp: 18 18 18 18   Temp:  98.1 F (36.7 C) 97.6 F (36.4 C) (!) 97.5 F (36.4 C)  TempSrc:  Oral Oral Oral  SpO2: 98%   99%  Weight:      Height:        Intake/Output Summary (Last 24 hours) at  05/18/2024 1045 Last data filed at 05/18/2024 0926 Gross per 24 hour  Intake 166.5 ml  Output 2200 ml  Net -2033.5 ml  Filed Weights   05/04/24 2200 05/06/24 0500 05/07/24 0500  Weight: 44.9 kg 100.4 kg 99.3 kg    Examination:  Physical Exam: GEN: NAD, alert and oriented x 3, ill in appearance HEENT: NCAT, PERRL, EOMI, sclera clear, MMM PULM: CTAB w/o wheezes/crackles, normal respiratory effort, on room air CV: RRR w/o M/G/R GI: abd soft, mild TTP surrounding wound VAC site, ostomy noted with stool in collection bag; urostomy noted with urine in collection bag, bilateral PCNs noted with clear urine. MSK: no peripheral edema, muscle strength globally intact 5/5 bilateral upper/lower extremities NEURO: CN II-XII intact, no focal deficits, sensation to light touch intact PSYCH: normal mood/affect Integumentary: Abdominal wound VAC site noted/urostomy, otherwise no other concerning rashes/lesions/wounds noted on component skin service.    Data Reviewed: I have personally reviewed following labs and imaging studies  CBC: Recent Labs  Lab 05/12/24 0412 05/13/24 0500 05/14/24 0315 05/15/24 0610 05/16/24 0326  WBC 9.3 11.0* 11.0* 8.8 8.8  HGB 11.4* 12.1* 11.7* 11.7* 12.9*  HCT 36.2* 37.4* 37.1* 36.8* 40.3  MCV 93.5 92.6 93.9 92.5 93.1  PLT 147* 191 196 221 232   Basic Metabolic Panel: Recent Labs  Lab 05/13/24 0500 05/14/24 0315 05/15/24 0610 05/16/24 0326 05/17/24 0552 05/18/24 0207  NA 131* 131* 132* 131* 128* 130*  K 4.5 4.2 3.8 4.0 3.6 3.7  CL 101 101 103 101 97* 101  CO2 16* 16* 19* 17* 17* 17*  GLUCOSE 166* 116* 110* 114* 105* 103*  BUN 107* 90* 77* 78* 69* 57*  CREATININE 2.26* 2.15* 1.89* 2.11* 1.92* 1.72*  CALCIUM  10.3 10.0 9.7 10.2 9.8 9.7  MG 2.3  --   --   --   --   --   PHOS 5.8*  5.8* 5.7* 5.0* 5.1* 4.8* 4.8*   GFR: Estimated Creatinine Clearance: 47.3 mL/min (A) (by C-G formula based on SCr of 1.72 mg/dL (H)). Liver Function Tests: Recent Labs   Lab 05/14/24 0315 05/15/24 0610 05/16/24 0326 05/17/24 0552 05/18/24 0207  ALBUMIN  3.1* 3.0* 3.3* 3.1* 3.1*   No results for input(s): LIPASE, AMYLASE in the last 168 hours. No results for input(s): AMMONIA in the last 168 hours. Coagulation Profile: No results for input(s): INR, PROTIME in the last 168 hours. Cardiac Enzymes: No results for input(s): CKTOTAL, CKMB, CKMBINDEX, TROPONINI in the last 168 hours. BNP (last 3 results) No results for input(s): PROBNP in the last 8760 hours. HbA1C: No results for input(s): HGBA1C in the last 72 hours. CBG: Recent Labs  Lab 05/17/24 1909 05/17/24 1939 05/17/24 2313 05/18/24 0304 05/18/24 0752  GLUCAP 176* 161* 137* 109* 105*   Lipid Profile: No results for input(s): CHOL, HDL, LDLCALC, TRIG, CHOLHDL, LDLDIRECT in the last 72 hours. Thyroid  Function Tests: No results for input(s): TSH, T4TOTAL, FREET4, T3FREE, THYROIDAB in the last 72 hours. Anemia Panel: No results for input(s): VITAMINB12, FOLATE, FERRITIN, TIBC, IRON , RETICCTPCT in the last 72 hours. Sepsis Labs: No results for input(s): PROCALCITON, LATICACIDVEN in the last 168 hours.  No results found for this or any previous visit (from the past 240 hours).       Radiology Studies: No results found.       Scheduled Meds:  apixaban   5 mg Oral BID   Chlorhexidine  Gluconate Cloth  6 each Topical Daily   vitamin B-12  1,000 mcg Oral Daily   feeding supplement  237 mL Oral BID BM   feeding supplement (PROSource TF20)  60 mL Per Tube BID   insulin  aspart  0-20 Units Subcutaneous Q4H   insulin  glargine-yfgn  25 Units Subcutaneous QHS   melatonin  5 mg Oral QHS   multivitamin with minerals  1 tablet Oral Daily   nutrition supplement (JUVEN)  1 packet Oral BID BM   mouth rinse  15 mL Mouth Rinse 4 times per day   polycarbophil  625 mg Oral BID   rosuvastatin   10 mg Oral QPM   sodium chloride  flush  5 mL  Intracatheter Q8H   Continuous Infusions:  sodium chloride  Stopped (05/01/24 0014)   albumin  human     anticoagulant sodium citrate      micafungin  (MYCAMINE ) 100 mg in sodium chloride  0.9 % 100 mL IVPB Stopped (05/17/24 1052)   piperacillin -tazobactam (ZOSYN )  IV 3.375 g (05/18/24 0603)     LOS: 38 days    Time spent: 47 minutes spent on 05/18/2024 caring for this patient face-to-face including chart review, ordering labs/tests, documenting, discussion with nursing staff, consultants, updating family and interview/physical exam    Camellia PARAS [place], DO [facility] Available via Epic secure chat 7am-7pm After these hours, please refer to coverage provider listed on [email or web address] 05/18/2024, 10:45 AM

## 2024-05-18 NOTE — Consult Note (Signed)
 WOC Nurse wound follow up Wound type: surgical     Measurement: 17 cm x 5.5 cm x 2.5 cm  Wound bed: pink, moist,  Drainage (amount, consistency, odor) serosanguinous in cannister  Periwound: intact Dressing procedure/placement/frequency: Removed old gauze dressing  Filled wound with _3___ piece of black foam, barrier ring utilized in the abd crease distal to the umbilicus to achieve seal.  Sealed NPWT dressing at HG Patient tolerated procedure well  WOC nurse will continue to provide NPWT dressing changed due to the complexity of the dressing change.   WOC Nurse ostomy follow up Stoma type/location: RLQ ileal conduit, RUQ ileostomy Stomal assessment/size:  RLQ ileal conduit: oval approximately 1 in x 1 1/4 in RUQ ileostomy 1 1/4 in round, slightly budded, os at 1 o'clock Peristomal assessment:  Dips in abdominal topography surrounding ileal conduit RUQ ileostomy: surrounding skin intac Treatment options for stomal/peristomal skin:  2 inch barrier ring utilized around both stomas Output  Clear urine Liquid brown stool Ostomy pouching: 1pc. Soft convex urostomy and ileostomy pouch  Education provided: None Enrolled patient in [employer] Discharge program: Yes  WOC team will continue to provide NPWT dressing changed due to the complexity of the dressing change and ostomy teaching as needed.   Doyal Polite, RN, MSN, [facility] WOC Team

## 2024-05-18 NOTE — Progress Notes (Signed)
 Patient ID: Kristopher Thompson, male   DOB: [birth year], 73 y.o.   MRN: [id number]  27 Days Post-Op Subjective: Pt with no setbacks over the weekend.  Feeling ok this morning.  Objective: Vital signs in last 24 hours: Temp:  [97.5 F (36.4 C)-98.1 F (36.7 C)] 97.6 F (36.4 C) (07/07 0310) Pulse Rate:  [96-105] 101 (07/06 2040) Resp:  [14-20] 18 (07/07 0310) BP: (98-114)/(70-76) 114/73 (07/07 0310) SpO2:  [94 %-98 %] 98 % (07/06 2040)  Intake/Output from previous day: 07/06 0701 - 07/07 0700 In: 259 [IV Piggyback:259] Out: 2695 [Urine:1925; Drains:620; Stool:150] Intake/Output this shift: No intake/output data recorded.  Physical Exam:  General: Alert and oriented Abd:   Lab Results: Recent Labs    05/16/24 0326  HGB 12.9*  HCT 40.3      Latest Ref Rng & Units 05/16/2024    3:26 AM 05/15/2024    6:10 AM 05/14/2024    3:15 AM  CBC  WBC 4.0 - 10.5 K/uL 8.8  8.8  11.0   Hemoglobin 13.0 - 17.0 g/dL 87.0  88.2  88.2   Hematocrit 39.0 - 52.0 % 40.3  36.8  37.1   Platelets 150 - 400 K/uL 232  221  196      BMET Recent Labs    05/17/24 0552 05/18/24 0207  NA 128* 130*  K 3.6 3.7  CL 97* 101  CO2 17* 17*  GLUCOSE 105* 103*  BUN 69* 57*  CREATININE 1.92* 1.72*  CALCIUM  9.8 9.7     Studies/Results: Drain Cr > 50  Assessment/Plan: 1) Urine leak s/p h/o prior radical cystectomy/ileal conduit urinary diversion in past: He has remained clinically stable since last week after the right nephroureteral catheter was placed.  Decreased output from right PCN and increased urostomy output last shift may simply reflect change in drainage pattern but will have PCN flushed this morning.  If continued poor output, may need IR to re-evaluate positioning of right PCN to ensure optimal drainage.  Otherwise, leak appears to persist but he is clinically stable with improved renal function close to his baseline.  There is no absolute indication for acute surgical repair at this time but  prognosis with conservative resolution of urine leak remains guarded.  Will continue to follow.    LOS: 38 days   Noretta Ferrara 05/18/2024, 7:12 AM

## 2024-05-18 NOTE — Progress Notes (Addendum)
 05/18/2024  Kristopher Thompson [id number] [birth year]  CARE TEAM: PCP: [name], MD  Outpatient Care Team: Patient Care Team: [name], MD as PCP - General (Internal Medicine) [name], MD (Inactive) as Consulting Physician (Dermatology) [name], MD as Referring Physician (Pulmonary Disease) [name], MD as Consulting Physician (General Surgery) [name], MD as Consulting Physician (Urology)  Inpatient Treatment Team: Treatment Team:  [place], [name], DO [name], MD [name], MD Massie Delaine SAUNDERS, RN Dea Shiner, MD Ruthellen [facility], MD Luverne Aran, MD [name], MD [name], MD [name], [place] Quin Aleck ORN, RN Stroup, Johana FORBES, PT Josepha Cassis, Student-PT   Problem List:   Principal Problem:   Delayed bowel perforation s/p SBR/end ileostomy Active Problems:   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 08/08/2017   S/P ileal conduit (HCC)   GERD (gastroesophageal reflux disease)   Obesity (BMI 35.0-39.9 without comorbidity)   Prolonged QT interval   Non-recurrent bilateral inguinal hernia without obstruction or gangrene   Incarcerated incisional hernia   Sinus tachycardia   Tachypnea   Acute respiratory insufficiency, postoperative   Sepsis due to undetermined organism (HCC)   Lactic acidosis   Class 2 obesity   Chronic anticoagulation   Hearing loss   History of bladder cancer   Obstructive sleep apnea of adult   Parastomal hernia of ileal conduit   Partial small bowel obstruction (HCC)   Personal history of PE (pulmonary embolism)   Pressure injury of skin   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   04/10/2024  POST-OPERATIVE DIAGNOSIS:  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   PROCEDURE:   -ROBOTIC LYSIS OF ADHESIONS X 4 HOURS -COMPONENT SEPARATION - TRANSVERSUS ABDOMINIS REALEASE  (TAR) BILATERAL -ROBOTIC REPAIRS OF  INCISIONAL, PARASTOMAL, LEFT INGUINAL  INCARCERATED ABDOMINAL WALL HERNIAS WITH MESH -UROSTOMY ILEAL CONDUIT REVISION -INTRAOPERATIVE ASSESSMENT OF TISSUE VASCULAR PERFUSION USING ICG (indocyanine green ) -IMMUNOFLUORESCENCE -TRANSVERSUS ABDOMINIS PLANE (TAP) BLOCK - BILATERAL    SURGEON:  [name], MD   04/20/2024 POST-OPERATIVE DIAGNOSIS:  perforated bowel   PROCEDURE:  Drainage of right abdominal wall abscess as well as intra-abdominal abscess Explantation of abdominal mesh Small bowel resection (in discontinuity) Placement of ABThera wound VAC   SURGEON:  [name], MD  04/21/2024  POST-OPERATIVE DIAGNOSIS:  DELAYED BOWEL PERFORATION WITH OPEN ABDOMEN   PROCEDURE:   LYSIS OF ADHESIONS X ABDOMINAL WALL DEBRIDEMENT ILEAL RESECTION END ILEOSTOMY ABDOMINAL WALL PARASTOMAL & INCISIONAL HERNIA REPAIRS WITH PHASIX MESH FASCIA CLOSURE WITH WOUND VAC PLACEMENT IN SQ   SURGEON:  [name], MD    05/04/2024 Interventional Radiology Procedure Note   Procedure: Bilateral percutaneous nephrostomy tube placements   Findings: Please refer to procedural dictation for full description. 10 Fr bilateral to bag drainage.   Complications: None immediate   Estimated Blood Loss: < 5 ml   Recommendations: Keep to bag drainage.  IR will follow.  Ultimate management as per Urology.   Assessment  -Incarcerated parastomal incisional hernias with obstructive symptoms status post robotic takedown and repair -Delayed small bowel perforation s/p ileal resection and end ileostomy -Delayed urine leak of urostomy ileal conduit status post perc nephrostomy tubes -Multisystem organ failure gradually resolving  SLOWLY IMPROVING   Plan:  Delayed urine leak with AKI:  Urinary diversion   Internal:  with percutaneous nephrostomy tubes.  Adjusted 6/30, 7/1, 7/3 with stent going from kidney out urostomy across the presumed mid ileal on  the right side leak.  Urine draining into urostomy and right lower quadrant surgical Blake drain in the abdominal wall resting just outside the ileal conduit.  Left lower quadrant intraperitoneal Blake surgical drain with minimal output through the weekend.  I removed it.  Defer nephrostomy tubes or adjusting stenting to urology.  Suspect will need to keep those.  Hopefully the small ileal conduit leak will seal on its own with proper diversion.  We will see.  If not controlled with urinary diversion and stenting, he may require revision of his ileal conduit.  That would be very high risk.  Understand they wish to hold off but do not want to have nephrostomy tubes indefinitely either if possible.  Defer to Dr. Renda urology with interventional radiology   Serum BUN & creatinine slowly coming down.  Likelihood of repeat dialysis seems rather low.  Nephrology following more peripherally  Nutrition:  Diet as tolerated.  Nephrology recommends renal diet.  Eating better - follow  Bowel regimen for ileostomy.  soluble polyfiber/FiberCon to thicken with nightly   Weaned off TPN 6/16  I removed corpak 7/2  Hold off on G-tube unless poor PO effort >14days  Infection due to delayed bowel perforation and abdominal wall infection/necrosis  Growing multiple organisms including Candida albicans, Enterobacter, and Enterococcus.    IV Zosyn /micafungin .  Could consider switching to Bactrim /fluconazole  but concern for QT interval on the fluconazole .  Continue IV for now  Anticipate prolonged antibiotics given the abdominal wall infection/necrosis and need for Phasix mesh.  Infectious disease anticipating 6 weeks = finish this week.  Defer to them.  Midline incision wound VAC in place and removed by me 7/7 at bedside.  Has excellent granulation tissue and mostly superficial except for periumbilical infraumbilical locations down to 3 cm deep but 98% good granulation.  Scant PDS exposed.  Markedly  improved and gradually closing.  I think he would benefit with continued wound VAC   Metabolic encephalopathy resolved.  Follow.  Insomnia improved  ABLA on top of anemia of chronic disease improved with transfusion.  Follow.    -monitor electrolytes & replace as needed.  Keep K>4, Mg>2, Phos>3.    -Diabetes.  Sliding scale insulin .    -VTE prophylaxis-  Full anticoagulation given history of pulmonary embolism.  Eliquis .  -Mobilize as tolerated.  Starting to work with physical and Occupational Therapy more.  He tends to refuse to get out of bed.  External encouraged him to get out of bed to rehab more.    I updated the patient's status to the the patient, wife, and nurses.  Recommendations were made.  Questions were answered.  They expressed understanding & appreciation.  -Disposition: Possible transition to inpatient rehab if makes goals.  I would hold off on LTAC.     I reviewed nursing notes, last 24 h vitals and pain scores, last 48 h intake and output, last 24 h labs and trends, and last 24 h imaging results.  I have reviewed this patient's available data, including medical history, events of note, test results, etc as part of my evaluation.   A significant portion of that time was spent in counseling. Care during the described time interval was provided by me.  This care required high  level of medical decision making.  05/18/2024    Subjective: (Chief complaint)  Slowly improving for the weekend.  Left-sided abdominal drain not putting out much.  Still with some intermittent nausea but eating pretty good overall.  Hesitant to get  out of bed.    Objective:  Vital signs:  Vitals:   05/17/24 1935 05/17/24 2040 05/17/24 2316 05/18/24 0310  BP: 98/72  109/70 114/73  Pulse: (!) 105 (!) 101    Resp:  18 18 18   Temp: 97.8 F (36.6 C)  98.1 F (36.7 C) 97.6 F (36.4 C)  TempSrc: Oral  Oral Oral  SpO2: 96% 98%    Weight:      Height:        Last BM Date :  05/17/24  Intake/Output   Yesterday:  07/06 0701 - 07/07 0700 In: 259 [IV Piggyback:259] Out: 2695 [Urine:1925; Drains:620; Stool:150] This shift:  No intake/output data recorded.  Bowel function:  Flatus: YES  BM:  YES  Drains:  RLQ drain (rests between abdominal wall and Phasix mesh): Serous low volume.  LLQ drain (runs over bowel with tip down in RLQ pelvis near base of ileal conduit and site of bowel resection): scant serous - removed 7/7   Nephrostomy tubes in place.  - light yellow-colored  Physical Exam:  General: Resting in no acute distress.  Legs crossed looking relaxed.  Mentally back to baseline - quiet but answers questions.  Eyes: PERRL, normal EOM.  Sclera clear.  No icterus Neuro: CN II-XII intact w/o focal sensory/motor deficits. Lymph: No head/neck/groin lymphadenopathy Psych:  No delerium/psychosis/paranoia.  No agitation HENT: Normocephalic, Mucus membranes moist.  No thrush. Mildly hard of hearing Neck: Supple, No tracheal deviation.  No obvious thyromegaly Chest: No pain to chest wall compression.  Good respiratory excursion.   CV:  Pulses intact.  Regular rhythm.  1-2+ BUE/BLE edema MS:  No obvious deformity  Abdomen:  Obese Soft.  Nondistended.  Nontender Wound vac (in SQ over closed fascia) in midline.    RUQ: (End ileostomy): Pink mucosa.  Brown oatmeal thick effluent in bag RLQ:  (Urostomy ileal conduit): Pink mucosa without any obvious separation.  Moderate clear light yellow-colored urine.  Stent out ostomy  Ext:   No deformity.  No edema.  No cyanosis Skin: No petechiae / purpurea.  No major sores.  Warm and dry    Results:   Cultures: Recent Results (from the past 720 hours)  Culture, blood (Routine X 2) w Reflex to ID Panel     Status: None   Collection Time: 04/20/24  8:46 AM   Specimen: BLOOD RIGHT ARM  Result Value Ref Range Status   Specimen Description   Final    BLOOD RIGHT ARM Performed at [facility], [address], [place], [place] [address]    Special Requests   Final    BOTTLES DRAWN AEROBIC AND ANAEROBIC Blood Culture results may not be optimal due to an inadequate volume of blood received in culture bottles Performed at [facility], [address], [place], [place] [address]    Culture   Final    NO GROWTH 5 DAYS Performed at [facility], [address] [place], [place] [address]    Report Status 04/25/2024 FINAL  Final  Culture, blood (Routine X 2) w Reflex to ID Panel     Status: None   Collection Time: 04/20/24  8:49 AM   Specimen: BLOOD  Result Value Ref Range Status   Specimen Description   Final    BLOOD Performed at [facility], [address], [place], [place] [address]    Special Requests   Final    BOTTLES DRAWN AEROBIC AND ANAEROBIC Blood  Culture results may not be optimal due to an inadequate volume of blood received in culture bottles Performed at [facility], [address], [place], [place] [address]    Culture   Final    NO GROWTH 5 DAYS Performed at [facility], [address], [place], [place] [address]    Report Status 04/25/2024 FINAL  Final  Culture, Respiratory w Gram Stain     Status: None   Collection Time: 04/20/24 12:03 PM   Specimen: Tracheal Aspirate; Respiratory  Result Value Ref Range Status   Specimen Description   Final    TRACHEAL ASPIRATE Performed at [facility], [address], [place], [place] [address]    Special Requests   Final    NONE Performed at [facility], [address], [place], [place] [address]    Gram Stain NO WBC SEEN RARE GRAM POSITIVE COCCI   Final   Culture   Final    RARE Normal respiratory flora-no Staph aureus or Pseudomonas seen Performed at [facility], [address], [place], [place] [address]    Report Status 04/23/2024 FINAL  Final  Aerobic/Anaerobic Culture w Gram Stain (surgical/deep wound)     Status:  None   Collection Time: 04/21/24  2:23 PM   Specimen: Soft Tissue, Other  Result Value Ref Range Status   Specimen Description   Final    TISSUE INTRA ABDOMINAL NECROTIC TISSUE Performed at [facility], [address], [place], [place] [address]    Special Requests   Final    NONE Performed at [facility], [address], [place], [place] [address]    Gram Stain   Final    FEW WBC PRESENT,BOTH PMN AND MONONUCLEAR RARE GRAM POSITIVE COCCI IN PAIRS AND CHAINS RARE YEAST    Culture   Final    RARE ESCHERICHIA COLI FEW CANDIDA ALBICANS FEW ENTEROCOCCUS FAECALIS FEW BACTEROIDES SPECIES BETA LACTAMASE POSITIVE Performed at [facility], [address], [place], [place] [address]    Report Status 04/25/2024 FINAL  Final   Organism ID, Bacteria ESCHERICHIA COLI  Final   Organism ID, Bacteria ENTEROCOCCUS FAECALIS  Final      Susceptibility   Escherichia coli - MIC*    AMPICILLIN >=32 RESISTANT Resistant     CEFEPIME  <=0.12 SENSITIVE Sensitive     CEFTAZIDIME <=1 SENSITIVE Sensitive     CEFTRIAXONE  1 SENSITIVE Sensitive     CIPROFLOXACIN  <=0.25 SENSITIVE Sensitive     GENTAMICIN  <=1 SENSITIVE Sensitive     IMIPENEM <=0.25 SENSITIVE Sensitive     TRIMETH /SULFA  <=20 SENSITIVE Sensitive     AMPICILLIN/SULBACTAM >=32 RESISTANT Resistant     PIP/TAZO 8 SENSITIVE Sensitive ug/mL    * RARE ESCHERICHIA COLI   Enterococcus faecalis - MIC*    AMPICILLIN <=2 SENSITIVE Sensitive     VANCOMYCIN  1 SENSITIVE Sensitive     GENTAMICIN  SYNERGY SENSITIVE Sensitive     * FEW ENTEROCOCCUS FAECALIS    Labs: Results for orders placed or performed during the hospital encounter of 04/10/24 (from the past 48 hours)  Glucose, capillary     Status: Abnormal   Collection Time: 05/16/24  7:34 AM  Result Value Ref Range   Glucose-Capillary 121 (H) 70 - 99 mg/dL    Comment: Glucose reference range applies only to samples taken after fasting for at least 8  hours.  Glucose, capillary     Status: Abnormal  Collection Time: 05/16/24 11:31 AM  Result Value Ref Range   Glucose-Capillary 186 (H) 70 - 99 mg/dL    Comment: Glucose reference range applies only to samples taken after fasting for at least 8 hours.  Glucose, capillary     Status: Abnormal   Collection Time: 05/16/24  3:41 PM  Result Value Ref Range   Glucose-Capillary 123 (H) 70 - 99 mg/dL    Comment: Glucose reference range applies only to samples taken after fasting for at least 8 hours.  Glucose, capillary     Status: Abnormal   Collection Time: 05/16/24  8:31 PM  Result Value Ref Range   Glucose-Capillary 127 (H) 70 - 99 mg/dL    Comment: Glucose reference range applies only to samples taken after fasting for at least 8 hours.   Comment 1 Notify RN    Comment 2 Document in Chart   Glucose, capillary     Status: Abnormal   Collection Time: 05/17/24 12:22 AM  Result Value Ref Range   Glucose-Capillary 110 (H) 70 - 99 mg/dL    Comment: Glucose reference range applies only to samples taken after fasting for at least 8 hours.   Comment 1 Notify RN    Comment 2 Document in Chart   Glucose, capillary     Status: Abnormal   Collection Time: 05/17/24  4:16 AM  Result Value Ref Range   Glucose-Capillary 112 (H) 70 - 99 mg/dL    Comment: Glucose reference range applies only to samples taken after fasting for at least 8 hours.   Comment 1 Notify RN    Comment 2 Document in Chart   Renal function panel     Status: Abnormal   Collection Time: 05/17/24  5:52 AM  Result Value Ref Range   Sodium 128 (L) 135 - 145 mmol/L   Potassium 3.6 3.5 - 5.1 mmol/L   Chloride 97 (L) 98 - 111 mmol/L   CO2 17 (L) 22 - 32 mmol/L   Glucose, Bld 105 (H) 70 - 99 mg/dL    Comment: Glucose reference range applies only to samples taken after fasting for at least 8 hours.   BUN 69 (H) 8 - 23 mg/dL   Creatinine, Ser 8.07 (H) 0.61 - 1.24 mg/dL   Calcium  9.8 8.9 - 10.3 mg/dL   Phosphorus 4.8 (H) 2.5 - 4.6  mg/dL   Albumin  3.1 (L) 3.5 - 5.0 g/dL   GFR, Estimated 37 (L) >60 mL/min    Comment: (NOTE) Calculated using the CKD-EPI Creatinine Equation (2021)    Anion gap 14 5 - 15    Comment: Performed at [facility], [address] [place], [place] [address]  Glucose, capillary     Status: Abnormal   Collection Time: 05/17/24  7:24 AM  Result Value Ref Range   Glucose-Capillary 110 (H) 70 - 99 mg/dL    Comment: Glucose reference range applies only to samples taken after fasting for at least 8 hours.  Creatinine, fluid (pleural, peritoneal, JP Drainage)     Status: None   Collection Time: 05/17/24  8:29 AM  Result Value Ref Range   Creat, Fluid >50.0 mg/dL    Comment: RESULT CONFIRMED BY MANUAL DILUTION (NOTE) No normal range established for this test Results should be evaluated in conjunction with serum values    Fluid Type-FCRE JP DRAINAGE     Comment: Performed at [facility], [address], [place], [place] [address]  Glucose, capillary  Status: Abnormal   Collection Time: 05/17/24 11:21 AM  Result Value Ref Range   Glucose-Capillary 155 (H) 70 - 99 mg/dL    Comment: Glucose reference range applies only to samples taken after fasting for at least 8 hours.   Comment 1 Document in Chart   Glucose, capillary     Status: Abnormal   Collection Time: 05/17/24  3:49 PM  Result Value Ref Range   Glucose-Capillary 117 (H) 70 - 99 mg/dL    Comment: Glucose reference range applies only to samples taken after fasting for at least 8 hours.  Glucose, capillary     Status: Abnormal   Collection Time: 05/17/24  7:09 PM  Result Value Ref Range   Glucose-Capillary 176 (H) 70 - 99 mg/dL    Comment: Glucose reference range applies only to samples taken after fasting for at least 8 hours.  Glucose, capillary     Status: Abnormal   Collection Time: 05/17/24  7:39 PM  Result Value Ref Range   Glucose-Capillary 161 (H) 70 - 99 mg/dL    Comment: Glucose reference range applies only  to samples taken after fasting for at least 8 hours.  Glucose, capillary     Status: Abnormal   Collection Time: 05/17/24 11:13 PM  Result Value Ref Range   Glucose-Capillary 137 (H) 70 - 99 mg/dL    Comment: Glucose reference range applies only to samples taken after fasting for at least 8 hours.   Comment 1 Notify RN    Comment 2 Document in Chart   Prealbumin     Status: None   Collection Time: 05/18/24  1:59 AM  Result Value Ref Range   Prealbumin 38 18 - 38 mg/dL    Comment: Performed at [facility], [address] [place], [place] [address]  Renal function panel     Status: Abnormal   Collection Time: 05/18/24  2:07 AM  Result Value Ref Range   Sodium 130 (L) 135 - 145 mmol/L   Potassium 3.7 3.5 - 5.1 mmol/L   Chloride 101 98 - 111 mmol/L   CO2 17 (L) 22 - 32 mmol/L   Glucose, Bld 103 (H) 70 - 99 mg/dL    Comment: Glucose reference range applies only to samples taken after fasting for at least 8 hours.   BUN 57 (H) 8 - 23 mg/dL   Creatinine, Ser 8.27 (H) 0.61 - 1.24 mg/dL   Calcium  9.7 8.9 - 10.3 mg/dL   Phosphorus 4.8 (H) 2.5 - 4.6 mg/dL   Albumin  3.1 (L) 3.5 - 5.0 g/dL   GFR, Estimated 42 (L) >60 mL/min    Comment: (NOTE) Calculated using the CKD-EPI Creatinine Equation (2021)    Anion gap 12 5 - 15    Comment: Performed at [facility], [address], [place], [place] [address]  Glucose, capillary     Status: Abnormal   Collection Time: 05/18/24  3:04 AM  Result Value Ref Range   Glucose-Capillary 109 (H) 70 - 99 mg/dL    Comment: Glucose reference range applies only to samples taken after fasting for at least 8 hours.   Comment 1 Notify RN    Comment 2 Document in Chart     Imaging / Studies: No results found.       Medications / Allergies: per chart  Antibiotics: Anti-infectives (From admission, onward)    Start     Dose/Rate Route Frequency Ordered Stop   05/06/24 1400  piperacillin -tazobactam (ZOSYN ) IVPB  3.375 g        3.375  g 12.5 mL/hr over 240 Minutes Intravenous Every 8 hours 05/06/24 1227     05/01/24 1400  piperacillin -tazobactam (ZOSYN ) IVPB 2.25 g  Status:  Discontinued        2.25 g 100 mL/hr over 30 Minutes Intravenous Every 8 hours 05/01/24 0957 05/06/24 1227   05/01/24 1045  micafungin  (MYCAMINE ) 100 mg in sodium chloride  0.9 % 100 mL IVPB        100 mg 105 mL/hr over 1 Hours Intravenous Every 24 hours 05/01/24 0949     04/29/24 1000  micafungin  (MYCAMINE ) 150 mg in sodium chloride  0.9 % 100 mL IVPB  Status:  Discontinued        150 mg 107.5 mL/hr over 1 Hours Intravenous Every 24 hours 04/28/24 1036 05/01/24 0949   04/28/24 1345  micafungin  (MYCAMINE ) 100 mg in sodium chloride  0.9 % 100 mL IVPB        100 mg 105 mL/hr over 1 Hours Intravenous  Once 04/28/24 1245 04/28/24 1354   04/28/24 1000  micafungin  (MYCAMINE ) 100 mg in sodium chloride  0.9 % 100 mL IVPB  Status:  Discontinued        100 mg 105 mL/hr over 1 Hours Intravenous Every 24 hours 04/27/24 1219 04/28/24 1036   04/21/24 1400  clindamycin  (CLEOCIN ) 900 mg, gentamicin  (GARAMYCIN ) 240 mg in sodium chloride  0.9 % 1,000 mL for intraperitoneal lavage  Status:  Discontinued         Irrigation To Surgery 04/21/24 1346 04/21/24 1618   04/20/24 1400  piperacillin -tazobactam (ZOSYN ) IVPB 3.375 g  Status:  Discontinued        3.375 g 12.5 mL/hr over 240 Minutes Intravenous Every 8 hours 04/20/24 0755 05/01/24 0957   04/20/24 1000  metroNIDAZOLE  (FLAGYL ) IVPB 500 mg  Status:  Discontinued        500 mg 100 mL/hr over 60 Minutes Intravenous Every 12 hours 04/20/24 0320 04/20/24 0752   04/20/24 1000  micafungin  (MYCAMINE ) 150 mg in sodium chloride  0.9 % 100 mL IVPB  Status:  Discontinued        150 mg 107.5 mL/hr over 1 Hours Intravenous Every 24 hours 04/20/24 0752 04/27/24 1219   04/20/24 0245  ceFEPIme  (MAXIPIME ) 2 g in sodium chloride  0.9 % 100 mL IVPB  Status:  Discontinued        2 g 200 mL/hr over 30 Minutes Intravenous Every 8 hours  04/20/24 0232 04/20/24 0752   04/20/24 0100  metroNIDAZOLE  (FLAGYL ) IVPB 500 mg        500 mg 100 mL/hr over 60 Minutes Intravenous On call to O.R. 04/20/24 0004 04/20/24 0100   04/14/24 1800  ceFEPIme  (MAXIPIME ) 2 g in sodium chloride  0.9 % 100 mL IVPB        2 g 200 mL/hr over 30 Minutes Intravenous Every 8 hours 04/14/24 1003 04/19/24 0957   04/14/24 1600  vancomycin  (VANCOCIN ) IVPB 1000 mg/200 mL premix  Status:  Discontinued        1,000 mg 200 mL/hr over 60 Minutes Intravenous Every 24 hours 04/13/24 1420 04/14/24 1000   04/14/24 1600  vancomycin  (VANCOREADY) IVPB 1500 mg/300 mL  Status:  Discontinued        1,500 mg 150 mL/hr over 120 Minutes Intravenous Every 24 hours 04/14/24 1002 04/16/24 0744   04/14/24 1200  vancomycin  (VANCOCIN ) IVPB 1000 mg/200 mL premix  Status:  Discontinued        1,000 mg 200 mL/hr over  60 Minutes Intravenous Every 24 hours 04/13/24 1053 04/13/24 1109   04/13/24 2200  ceFEPIme  (MAXIPIME ) 2 g in sodium chloride  0.9 % 100 mL IVPB  Status:  Discontinued        2 g 200 mL/hr over 30 Minutes Intravenous Every 12 hours 04/13/24 1036 04/13/24 1109   04/13/24 2200  ceFEPIme  (MAXIPIME ) 2 g in sodium chloride  0.9 % 100 mL IVPB  Status:  Discontinued        2 g 200 mL/hr over 30 Minutes Intravenous Every 12 hours 04/13/24 1425 04/14/24 1003   04/13/24 1500  vancomycin  (VANCOCIN ) 2,000 mg in sodium chloride  0.9 % 500 mL IVPB        2,000 mg 260 mL/hr over 120 Minutes Intravenous  Once 04/13/24 1409 04/13/24 1850   04/13/24 1500  ceFEPIme  (MAXIPIME ) 2 g in sodium chloride  0.9 % 100 mL IVPB  Status:  Discontinued        2 g 200 mL/hr over 30 Minutes Intravenous Every 12 hours 04/13/24 1420 04/13/24 1425   04/13/24 1500  metroNIDAZOLE  (FLAGYL ) IVPB 500 mg  Status:  Discontinued        500 mg 100 mL/hr over 60 Minutes Intravenous Every 12 hours 04/13/24 1420 04/17/24 0725   04/13/24 1200  vancomycin  (VANCOCIN ) 2,000 mg in sodium chloride  0.9 % 500 mL IVPB  Status:   Discontinued        2,000 mg 260 mL/hr over 120 Minutes Intravenous  Once 04/13/24 1052 04/13/24 1121   04/13/24 1100  metroNIDAZOLE  (FLAGYL ) IVPB 500 mg  Status:  Discontinued        500 mg 100 mL/hr over 60 Minutes Intravenous 2 times daily 04/13/24 1007 04/13/24 1109   04/13/24 1100  vancomycin  (VANCOCIN ) IVPB 1000 mg/200 mL premix  Status:  Discontinued        1,000 mg 200 mL/hr over 60 Minutes Intravenous  Once 04/13/24 1007 04/13/24 1020   04/13/24 1015  ceFEPIme  (MAXIPIME ) 2 g in sodium chloride  0.9 % 100 mL IVPB        2 g 200 mL/hr over 30 Minutes Intravenous STAT 04/13/24 1007 04/14/24 1721   04/10/24 2200  ceFAZolin  (ANCEF ) IVPB 2g/100 mL premix        2 g 200 mL/hr over 30 Minutes Intravenous Every 8 hours 04/10/24 1833 04/11/24 0531   04/10/24 0600  ceFAZolin  (ANCEF ) IVPB 2g/100 mL premix        2 g 200 mL/hr over 30 Minutes Intravenous On call to O.R. 04/10/24 0533 04/10/24 1526         Note: Portions of this report may have been transcribed using voice recognition software. Every effort was made to ensure accuracy; however, inadvertent computerized transcription errors may be present.   Any transcriptional errors that result from this process are unintentional.    Elspeth KYM Schultze, MD, FACS, MASCRS Esophageal, Gastrointestinal & Colorectal Surgery Robotic and Minimally Invasive Surgery  [facility] [address], [address] [place], [place] [address] [phone] Fax [phone] Main  CONTACT INFORMATION: Weekday (9AM-5PM): Call [facility] main office at [phone] Weeknight (5PM-9AM) or Weekend/Holiday: Check EPIC Web Links tab & use AMION (password  [email or web address]) for General Surgery [facility] coverage  Please, DO NOT use SecureChat  (it is not reliable communication to reach operating surgeons & will lead to a delay in care).   Epic staff messaging available for outptient concerns needing 1-2 business day  response.      05/18/2024  7:32 AM

## 2024-05-18 NOTE — TOC Progression Note (Signed)
 Transition of Care [facility]) - Progression Note    Patient Details  Name: Kristopher Thompson MRN: [id number] Date of Birth: 11-17-1950  Transition of Care [facility]) CM/SW Contact  Lauraine FORBES Saa, LCSW Phone Number: 05/18/2024, 12:01 PM  Clinical Narrative:     12:01 PM Per hospitalist, patient is expected to discharge to [facility] end of this week. CSW relayed information to SNF admissions.  Expected Discharge Plan: Skilled Nursing Facility Barriers to Discharge: Continued Medical Work up  Expected Discharge Plan and Services In-house Referral: Clinical Social Work Discharge Planning Services: CM Consult Post Acute Care Choice: Skilled Nursing Facility Living arrangements for the past 2 months: Single Family Home                                       Social Determinants of Health (SDOH) Interventions SDOH Screenings   Food Insecurity: No Food Insecurity (04/12/2024)  Housing: Low Risk  (04/12/2024)  Transportation Needs: No Transportation Needs (04/12/2024)  Utilities: Not At Risk (04/12/2024)  Social Connections: Socially Integrated (04/10/2024)  Tobacco Use: Medium Risk (04/10/2024)    Readmission Risk Interventions    04/12/2024    4:58 PM  Readmission Risk Prevention Plan  Transportation Screening Complete  PCP or Specialist Appt within 3-5 Days Complete  HRI or Home Care Consult Complete  Social Work Consult for Recovery Care Planning/Counseling Complete  Palliative Care Screening Not Applicable  Medication Review Oceanographer) Complete

## 2024-05-19 DIAGNOSIS — K631 Perforation of intestine (nontraumatic): Secondary | ICD-10-CM | POA: Diagnosis not present

## 2024-05-19 LAB — RENAL FUNCTION PANEL
Albumin: 3.1 g/dL — ABNORMAL LOW (ref 3.5–5.0)
Anion gap: 10 (ref 5–15)
BUN: 62 mg/dL — ABNORMAL HIGH (ref 8–23)
CO2: 17 mmol/L — ABNORMAL LOW (ref 22–32)
Calcium: 10 mg/dL (ref 8.9–10.3)
Chloride: 102 mmol/L (ref 98–111)
Creatinine, Ser: 1.74 mg/dL — ABNORMAL HIGH (ref 0.61–1.24)
GFR, Estimated: 41 mL/min — ABNORMAL LOW (ref >60)
Glucose, Bld: 95 mg/dL (ref 70–99)
Phosphorus: 4.6 mg/dL (ref 2.5–4.6)
Potassium: 3.6 mmol/L (ref 3.5–5.1)
Sodium: 129 mmol/L — ABNORMAL LOW (ref 135–145)

## 2024-05-19 LAB — GLUCOSE, CAPILLARY
Glucose-Capillary: 84 mg/dL (ref 70–99)
Glucose-Capillary: 112 mg/dL — ABNORMAL HIGH (ref 70–99)
Glucose-Capillary: 134 mg/dL — ABNORMAL HIGH (ref 70–99)
Glucose-Capillary: 212 mg/dL — ABNORMAL HIGH (ref 70–99)
Glucose-Capillary: 102 mg/dL — ABNORMAL HIGH (ref 70–99)

## 2024-05-19 MED ORDER — LOPERAMIDE HCL 2 MG PO CAPS
2.0000 mg | ORAL_CAPSULE | Freq: Two times a day (BID) | ORAL | Status: DC
  Administered 2024-05-19 – 2024-05-22 (×7): 2 mg via ORAL
  Filled 2024-05-19 (×7): qty 1

## 2024-05-19 MED ORDER — PROSOURCE PLUS PO LIQD
30.0000 mL | Freq: Two times a day (BID) | ORAL | Status: DC
  Administered 2024-05-20: 30 mL via ORAL
  Filled 2024-05-19 (×4): qty 30

## 2024-05-19 MED ORDER — ALUM & MAG HYDROXIDE-SIMETH 200-200-20 MG/5ML PO SUSP
30.0000 mL | ORAL | Status: DC | PRN
  Administered 2024-05-19 (×2): 30 mL via ORAL
  Filled 2024-05-19 (×2): qty 30

## 2024-05-19 NOTE — Consult Note (Signed)
 WOC Nurse wound follow up WOC team received communication that wound vac was leaking, presented to bedside, patient reports leaking from ostomy site with appliance change by bedside RN.  Patient is concerned that stool may have leaked into wound dressing.  WTA lifted VAC drape, observed scant yellow discoloration to skin below drape from ostomy contents.   Wound type: surgical  Measurement: see prior note Wound bed: red, moist, granulation tissue through out Drainage (amount, consistency, odor) serosanguinous in cannister Periwound: intact Dressing procedure/placement/frequency: Removed old NPWT dressing  Filled wound with   __3__ piece of black foam, barrier ring at umbilicus. Sealed NPWT dressing at HG Patient tolerated procedure well  WOC nurse will continue to provide NPWT dressing changed due to the complexity of the dressing change.   Both ostomy appliances changed by bedside RN prior to [facility] arrival.  Thank you,  Doyal Polite, RN, MSN, [facility] WOC Team

## 2024-05-19 NOTE — Progress Notes (Signed)
 OT Cancellation Note  Patient Details Name: Kristopher Thompson MRN: [id number] DOB: 01-14-1951   Cancelled Treatment:    Reason Eval/Treat Not Completed: Patient declined, no reason specified (attempted x2 this AM, pt declines stating he is tired after just getting up to chair, agreeable for OT to return in ~30 mins. Upon return, pt already back in bed. Will check back this PM as schedule permits)  Tykel Badie K, OTD, OTR/L SecureChat Preferred Acute Rehab [phone]   Laneta MARLA Pereyra 05/19/2024, 11:55 AM

## 2024-05-19 NOTE — Progress Notes (Addendum)
 Nutrition Follow-up  DOCUMENTATION CODES:   Not applicable  INTERVENTION:   Continue current diet as ordered Encourage PO intake Encourage family to bring in food for pt  Room service with assist  Continue Ensure Plus High Protein po BID, each supplement provides 350 kcal and 20 grams of protein Continue 1 packet Juven BID PO, each packet provides 95 calories, 2.5 grams of protein (collagen) + micronutrients to support wound healing Continue MVI with minerals daily  Add Prosource Plus BID, as pt s pt with limited intake and wounds that require increased protein intake. Each packet contains 100 kcal, 15 gm protein  Added High calorie, High protein handout in AVS   NUTRITION DIAGNOSIS:   Increased nutrient needs related to acute illness as evidenced by estimated needs. 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 On 5/30 underwent hernia repair with urostomy revision.   5/30 - Admit; s/p lysis of ahdesions, repair of incisional, parastomal, left inguinal incarcerated abdominal wall hernias, urostomy ileal conduit revision; CLD 6/2 - NPO; NGT placed for suction; TPN initiated 6/4 - TPN increased to goal; NGT removed by patient; CLD then back to NPO after emesis; NGT replaced 6/9 - OR in the early AM for ex-lap, drainage of right abdominal wall abscess, explantation fo abdominal mesh, small bowel resection (left In discontinuity), AbThera wound VAC placement; returned to ICU intubated 6/10 - OR for washout and closure 6/11 - Worsening AKI with fluid overload, start CRRT 6/14 - extubated 6/15 - transferred to [facility] for iHD 6/16 - TPN discontinued, tolerating TF 6/17 - restarted  on CRRT 6/20 - CRRT stopped 6/23 - Last HD  6/30 - s/p replacement of PCN  7/1 - Antegrade nephrogram suspicious of leak, RLQ drain taken off suction,  7/2 - Cortrak removed, ureteral stent placed  Pt resting with wife at bedside. Pt reports he is doing much better than he was 2 weeks ago. Intake is improving, pt now has an appetite but still experiences early satiety. Does not drink much of the Ensures, maybe has 1-2 per day. Does drink the Juven. Only had 2 hard boiled eggs and grapes for breakfast this morning and a greek yogurt and grapes for lunch. Per meals recorded pt has been averaging 50-75% of his meals consumed. Pt is interested in continuing Juven and protein shakes outside of hospital. RD provided handouts in AVS.   Pt asked RD why he is on a renal diet. RD reached out to nephrology to see if pt can be placed back on regular diet since pt's renal function is improving.   Admit weight: 108.9 kg ? Accuracy, copied forward First measured weight: 98.4 kg (6/5) Current weight: 99.3 kg   Average Meal Intake: 7/3:50-100% x 2 meals 7/4: 50-75% x 3 meals 7/5: 50-100% x 3 meals   Intake/Output Summary (Last 24 hours) at 05/19/2024 1549 Last data filed at 05/19/2024 1253 Gross per 24 hour  Intake 69.98 ml  Output 1820 ml  Net -1750.02 ml  UOP: 1200 ml x 24 hours   Drains/Lines: JP drain 50 ml x 24 hours Wound vac 0 ml charted  Ileostomy 7 25 ml x 24 hours  Nutritionally Relevant  Medications: Scheduled Meds:  apixaban   5 mg Oral BID   Chlorhexidine  Gluconate Cloth  6 each Topical Daily   vitamin B-12  1,000 mcg Oral Daily   feeding supplement  237 mL Oral BID BM   feeding supplement (PROSource TF20)  60 mL Per Tube BID   insulin  aspart  0-20 Units Subcutaneous Q4H   insulin  glargine-yfgn  25 Units Subcutaneous QHS   loperamide   2 mg Oral BID   melatonin  5 mg Oral QHS   multivitamin with minerals  1 tablet Oral Daily   nutrition supplement (JUVEN)  1 packet Oral BID BM    mouth rinse  15 mL Mouth Rinse 4 times per day   polycarbophil  625 mg Oral BID   rosuvastatin   10 mg Oral QPM   sodium chloride  flush  5 mL Intracatheter Q8H   Continuous Infusions:  sodium chloride  Stopped (05/01/24 0014)   albumin  human     anticoagulant sodium citrate      Labs Reviewed: Sodium 129 BUN 62 Creatinine 1.74 Albumin  3.1 AST 46 ALT 74 GFR 41 TG 195 CBG ranges from 84-158 mg/dL over the last 24 hours HgbA1c 5.3   Diet Order:   Diet Order             Diet renal with fluid restriction Fluid restriction: 1200 mL Fluid; Room service appropriate? Yes; Fluid consistency: Thin  Diet effective now                   EDUCATION NEEDS:   Not appropriate for education at this time  Skin:  Skin Assessment: Skin Integrity Issues: Skin Integrity Issues:: Stage II, Wound VAC Stage II: Sacrum Wound Vac: Abdomen Incisions: 5/30 abdomen  Last BM:  7/8 725 ml via ileostomy x 24 hours  Height:   Ht Readings from Last 1 Encounters:  04/24/24 5' 11 (1.803 m)    Weight:   Wt Readings from Last 1 Encounters:  05/07/24 99.3 kg    Ideal Body Weight:  78.2 kg  BMI:  Body mass index is 30.53 kg/m.  Estimated Nutritional Needs:   Kcal:  2200-2500 kcal/d  Protein:  120-140g/d  Fluid:  2.2-2.5L/d   Olivia Kenning, RD Registered Dietitian  See Amion for more information

## 2024-05-19 NOTE — Progress Notes (Signed)
 Pt self placing of home cpap unit. No needs at this time. RT will continue to be available as needed.

## 2024-05-19 NOTE — Progress Notes (Signed)
 Patient ID: Kristopher Thompson, male   DOB: 12/24/1950, 73 y.o.   MRN: [id number]   Chart reviewed from overnight.  Outputs not recorded since last evening but suggestion of improved right PCN drainage and decreased drain output is encouraging.  Renal function stable.  Continue current care.

## 2024-05-19 NOTE — Progress Notes (Signed)
 Occupational Therapy Treatment Patient Details Name: Kristopher Thompson MRN: [id number] DOB: [birth year] Today's Date: 05/19/2024   History of present illness 73 y/o M admitted to [facility] 5/30 for hernia repair and urostomy revision. 6/9 to OR for perforated bowel, post op shock, s/p exploratory lapartomy with drainage of Rt abdominal wall abscess. CRRT 6/11-6/14. Extubated 6/14, Transferred to [facility] 6/15 for intermittent HD. CRRT again 6/17-6/20. 6/23 bil perc nephrostomy tubes placed. PMHx: bladder and prostate CA, RBBB, CHF, colostomy, DM, GERD, abdominal surgery.   OT comments  Pt making slow progress toward goals, is limited by fatigue, pain, dizziness. Pt able to perform bed mobility min A and min A for seated grooming tasks once seated in chair, pt declines further mobility at this time. Pt presenting with impairments listed below, will follow acutely. Patient will benefit from continued inpatient follow up therapy, <3 hours/day to maximize safety/ind with ADL/functional mobility.       If plan is discharge home, recommend the following:  Two people to help with walking and/or transfers;Two people to help with bathing/dressing/bathroom;Assistance with cooking/housework;Assistance with feeding;Direct supervision/assist for medications management;Direct supervision/assist for financial management;Assist for transportation;Help with stairs or ramp for entrance   Equipment Recommendations  Other (comment) (defer)    Recommendations for Other Services PT consult    Precautions / Restrictions Precautions Precautions: Fall Recall of Precautions/Restrictions: Impaired Precaution/Restrictions Comments: R drain (L drain d/c 7/7), ileostomy, urostomy, abd wound vac (disconnected at time of treatment today, wound care to redo after session), bil perc nephrostomy drains Restrictions Weight Bearing Restrictions Per Provider Order: No       Mobility Bed Mobility Overal bed mobility: Needs  Assistance Bed Mobility: Rolling, Sidelying to Sit, Sit to Sidelying Rolling: Min assist Sidelying to sit: Min assist            Transfers Overall transfer level: Needs assistance Equipment used: Rolling walker (2 wheels) Transfers: Sit to/from Stand, Bed to chair/wheelchair/BSC Sit to Stand: Min assist                 Balance Overall balance assessment: Needs assistance Sitting-balance support: Feet supported Sitting balance-Leahy Scale: Good     Standing balance support: Reliant on assistive device for balance, Bilateral upper extremity supported Standing balance-Leahy Scale: Poor Standing balance comment: RW                           ADL either performed or assessed with clinical judgement   ADL Overall ADL's : Needs assistance/impaired     Grooming: Oral care;Wash/dry face;Set up;Sitting Grooming Details (indicate cue type and reason): seated in chair                 Toilet Transfer: Minimal assistance           Functional mobility during ADLs: Minimal assistance      Extremity/Trunk Assessment Upper Extremity Assessment Upper Extremity Assessment: Generalized weakness   Lower Extremity Assessment Lower Extremity Assessment: Defer to PT evaluation        Vision   Vision Assessment?: No apparent visual deficits   Perception Perception Perception: Not tested   Praxis Praxis Praxis: Not tested   Communication Communication Communication: No apparent difficulties Factors Affecting Communication: Hearing impaired   Cognition Arousal: Alert Behavior During Therapy: Flat affect Cognition: No apparent impairments             OT - Cognition Comments: patient's mentation is improving nicely, exhibits self limiting behaviors.  Following commands: Intact Following commands impaired: Only follows one step commands consistently      Cueing   Cueing Techniques: Verbal cues  Exercises       Shoulder Instructions       General Comments VSS on RA    Pertinent Vitals/ Pain       Pain Assessment Pain Assessment: Faces Pain Score: 3  Faces Pain Scale: Hurts little more Pain Location: generalized Pain Descriptors / Indicators: Discomfort, Sore Pain Intervention(s): Limited activity within patient's tolerance, Monitored during session, Repositioned  Home Living                                          Prior Functioning/Environment              Frequency  Min 2X/week        Progress Toward Goals  OT Goals(current goals can now be found in the care plan section)  Progress towards OT goals: Progressing toward goals  Acute Rehab OT Goals Patient Stated Goal: none stated OT Goal Formulation: With patient Time For Goal Achievement: 05/25/24 Potential to Achieve Goals: Fair ADL Goals Pt Will Perform Grooming: with supervision;standing Pt Will Perform Upper Body Dressing: with set-up;sitting Pt Will Perform Lower Body Dressing: with mod assist;sit to/from stand Pt Will Transfer to Toilet: with contact guard assist;stand pivot transfer;bedside commode Pt/caregiver will Perform Home Exercise Program: Both right and left upper extremity;Increased strength;Increased ROM;With Supervision Additional ADL Goal #1: Bed mobility with supervision and use of sdie rails to increased Ind with toileting.  Plan      Co-evaluation                 AM-PAC OT 6 Clicks Daily Activity     Outcome Measure   Help from another person eating meals?: A Little Help from another person taking care of personal grooming?: A Little Help from another person toileting, which includes using toliet, bedpan, or urinal?: A Lot Help from another person bathing (including washing, rinsing, drying)?: A Lot Help from another person to put on and taking off regular upper body clothing?: A Little Help from another person to put on and taking off regular lower body  clothing?: A Lot 6 Click Score: 15    End of Session Equipment Utilized During Treatment: Gait belt;Rolling walker (2 wheels)  OT Visit Diagnosis: Unsteadiness on feet (R26.81);Other abnormalities of gait and mobility (R26.89);Muscle weakness (generalized) (M62.81)   Activity Tolerance Other (comment) (self-limiting)   Patient Left with family/visitor present;in chair   Nurse Communication Mobility status        Time: 8569-8547 OT Time Calculation (min): 22 min  Charges: OT Treatments $Therapeutic Activity: 8-22 mins  Kile Kabler K, OTD, OTR/L SecureChat Preferred Acute Rehab [phone]   Laneta POUR Koonce 05/19/2024, 3:23 PM

## 2024-05-19 NOTE — Progress Notes (Signed)
 PROGRESS NOTE    Kristopher Thompson  FMW:[id number] DOB: February 29, 1951 DOA: 04/10/2024 PCP: [name], MD    Brief Narrative:   Kristopher Thompson is a 73 y.o. male with past medical history significant for HTN, chronic diastolic congestive heart failure, CAD, DM2, pulmonary embolism on Eliquis , anemia, obesity, prostate cancer, bladder cancer who was initially admitted by the general surgery service on 5/30 for operative management regarding parastomal and incisional incarcerated abdominal wall hernias; requiring extensive lysis, mesh insertion, repair, urostomy ileal conduit revision thereafter admitted to the ICU due to tachycardia and tachypnea. Initially placed on norepinephrine  and an arterial line was placed. Patient declined NG tube placement but eventually agreed. Patient was taken back to the OR on 6/9 due to perforated bowel and postop shock. Required multiple pressors, steroids and PRBC transfusion after washout/closure in the OR. On 6/11 developed AKI requiring dialysis.. Patient was extubated on 6/14.   Significant hospital events/procedures: 5/30: Admit to general surgery, robotic LOA, incisional/parastomal, left inguinal incarcerated hernia repair with mesh, urostomy ileal conduit revision, Dr. Sheldon 6/2: CCM consult and vasopressor 6/4: off NE. Pulled out his NGT refused replacement. After multiple emesis  6/5: NG tube replaced. 6/6: transferred to MedSurg. 6/9: OR for perforated bowel, drainage from right abdominal wall abscess, intra-abdominal abscess, small bowel resection, wound VAC; Dr. Tanda 6/10: NE, Phenylephrine , vasopressin , steroids, 1u PRBCs, back to OR for LOA, abdominal wall debridement, ileal resection, end ileostomy abdominal wall hernia repairs with mesh, fascial closure, wound VAC, Dr. Sheldon 6/11: Worsening AKI with fluid overload, start CRRT, decreasing pressor requirements 6/14: Off CRRT, arousable.  Extubated. 6/15: transferred to [facility] for iHD; montoring  for renal recovery 6/16: iHD, severely encephalopathic with high BUN 6/17: restarted CRRT for to ongoing uremia and encephalopathy 6/18: was awake enough to work with PT, OT 6/23: bilateral PCN placed by interventional radiology, Dr. Jennefer 6/24: MS imrpoving, Neph tubes in and draining 6/30: bilateral nephrostomy tube exchange by IR 7/2: Right percutaneous nephrostomy exchange with ureteral stent by IR, cortrak removed 7/7: Oral intake increased, completing micafungin /Zosyn  today  Assessment & Plan:    Incisional/parastomal, left inguinal incarcerated hernia s/p robotic hernia repair Septic shock secondary to acute peritonitis from bowel perforation Patient presenting for surgical repair of incisional/parastomal and left inguinal incarcerated hernia and underwent robotic LOA with mesh placement and urostomy ileal conduit revision by Dr. Sheldon on 04/10/2024.  Postoperatively patient required vasopressors and was transferred to the ICU.  Subsequent return to the OR on 04/20/2024 for bowel perforation underwent I&D of abdominal wall/intra-abdominal abscess with small bowel resection.  Returned to OR on 6/10 for further abdominal wall debridement, LOA, ileal resection, end ileostomy with abdominal wall hernia repairs with mesh, fascial closure with wound VAC.  Seen by infectious disease, Dr. Dea; with recommendation of 4-6-week course of Zosyn  and micafungin  treatment given mesh placed in a contaminated field with completion of treatment on 05/18/2024. -- Surgery following, appreciate assistance -- Wound VAC changes twice weekly Monday/Thursday -- Further per general surgery  Acute renal failure secondary to ischemic ATN/shock Bilateral hydronephrosis/obstruction -- Urology following, appreciate assistance -- Cr (0.97 04/01/2024), 2.08>>0.84>>3.70>>1.75>3.30>>2.61>2.60>2.26>2.15>1.89>2.11>1.92>1.72>2.08>1.74 -- Required CRRT followed by HD while inpatient, last HD 6/23; nephrology now signed  off -- S/p bilateral PCN on 6/23, exchanged 6/30; right exchanged 7/2  Urine leak w/ Hx prior radical cystectomy/ileal conduit urinary diversion CT loopogram 7/1 with finding of urine leak identified at inferior butt end of ileal conduit. Right percutaneous nephrostomy exchange with ureteral stent 7/2 by IR --  Further per urology and interventional radiology  Type 2 diabetes mellitus On metformin 500 mg p.o. daily outpatient.  Hemoglobin A1c 5.3 on 04/01/2024. -- Semglee  25u [place] at bedtime -- Resistant SSI for coverage  Acute blood loss anemia Right flank hematoma Anemia of chronic medical disease Anemia panel with iron  54, TIBC 281, ferritin 588 -- Hgb 12.5>>7.5>>9.9>>10.9>11.4>12.1>11.7>12.9>13.1, stable  Chronic diastolic congestive heart failure HTN -- Hydralazine  20 mg IV every 4 hours as needed SBP greater than 160  HLD -- Crestor  10 mg p.o. nightly  History of pulmonary embolism -- Eliquis  5 mg p.o. twice daily  Moderate protein calorie malnutrition Obesity, class I Body mass index is 30.53 kg/m. Nutrition Status: Nutrition Problem: Increased nutrient needs Etiology: acute illness Signs/Symptoms: estimated needs Interventions: Tube feeding, Prostat, Juven  Weakness/ability/deconditioning: -- PT/OT currently recommends CIR -- Continue therapy efforts while inpatient   DVT prophylaxis: Place TED hose Start: 04/28/24 0818 SCD's Start: 04/10/24 1834 apixaban  (ELIQUIS ) tablet 5 mg    Code Status: Full Code Family Communication: Updated spouse present at bedside this morning  Disposition Plan:  Level of care: Med-Surg Status is: Inpatient Remains inpatient appropriate because: Antibiotics, await sign off of general surgery and urology, will need SNF placement    Consultants:  General Surgery Interventional radiology Urology Nephrology: signed off 7/4 PCCM Infectious disease, Dr. Dea  Procedures:  robotic LOA, incisional/parastomal, left inguinal  incarcerated hernia repair with mesh, urostomy ileal conduit revision, Dr. Sheldon, 5/30 drainage from right abdominal wall abscess, intra-abdominal abscess, small bowel resection, wound VAC; Dr. Tanda, 6/9 LOA, abdominal wall debridement, ileal resection, end ileostomy, abdominal wall hernia repairs with mesh, wound VAC placement, Dr. Sheldon; 6/10 bilateral PCN placed by interventional radiology, Dr. Jennefer, 6/23 bilateral nephrostomy tube exchange by IR, 6/30 Right percutaneous nephrostomy exchange with ureteral stent by IR, 7/2  Antimicrobials:  Vancomycin  6/2 - 6/4 Metronidazole  6/2 - 6/5, 6/8 - 6/8 Cefepime  6/2 - 6/8 Zosyn  6/9 - 7/7 Micafungin  6/9 - 7/7   Subjective: Patient seen examined bedside, lying in bed.  Spouse present.  Eating breakfast, continues with intermittent nausea but oral intake improving.  Spouse states patient worked with physical therapy better yesterday.  Completed course of micafungin  and Zosyn  yesterday.  Discussed anticipated discharge to SNF by the end of the week unless surgery or urology plans further procedures.  No other issues, complaints, questions or concerns at this time.  Denies headache, no dizziness, no chest pain, no palpitations, no shortness of breath, no fever/chills/night sweats, no nausea/vomiting, no focal weakness, no fatigue, no paresthesia.  No acute events overnight per nursing staff.  Pending SNF placement.  Objective: Vitals:   05/18/24 2003 05/18/24 2330 05/19/24 0334 05/19/24 1135  BP: 109/62 115/74 98/72 95/70   Pulse: (!) 108 (!) 107 93 (!) 102  Resp: 18 18 16 20   Temp: 98 F (36.7 C) 97.9 F (36.6 C) 97.6 F (36.4 C) 97.7 F (36.5 C)  TempSrc: Oral Oral Oral Oral  SpO2: 97% 97% 98% 97%  Weight:      Height:        Intake/Output Summary (Last 24 hours) at 05/19/2024 1235 Last data filed at 05/19/2024 1134 Gross per 24 hour  Intake 575.78 ml  Output 2190 ml  Net -1614.22 ml   Filed Weights   05/04/24 2200 05/06/24 0500  05/07/24 0500  Weight: 44.9 kg 100.4 kg 99.3 kg    Examination:  Physical Exam: GEN: NAD, alert and oriented x 3, ill in appearance HEENT: NCAT, PERRL, EOMI, sclera  clear, MMM PULM: CTAB w/o wheezes/crackles, normal respiratory effort, on room air CV: RRR w/o M/G/R GI: abd soft, mild TTP surrounding wound VAC site, ostomy noted with stool in collection bag; urostomy noted with urine in collection bag, bilateral PCNs noted with clear urine. MSK: no peripheral edema, muscle strength globally intact 5/5 bilateral upper/lower extremities NEURO: CN II-XII intact, no focal deficits, sensation to light touch intact PSYCH: normal mood/affect Integumentary: Abdominal wound VAC site noted/urostomy, otherwise no other concerning rashes/lesions/wounds noted on component skin service.    Data Reviewed: I have personally reviewed following labs and imaging studies  CBC: Recent Labs  Lab 05/13/24 0500 05/14/24 0315 05/15/24 0610 05/16/24 0326 05/18/24 0600  WBC 11.0* 11.0* 8.8 8.8 10.4  HGB 12.1* 11.7* 11.7* 12.9* 13.1  HCT 37.4* 37.1* 36.8* 40.3 39.0  MCV 92.6 93.9 92.5 93.1 90.9  PLT 191 196 221 232 313   Basic Metabolic Panel: Recent Labs  Lab 05/13/24 0500 05/14/24 0315 05/15/24 0610 05/16/24 0326 05/17/24 0552 05/18/24 0207 05/18/24 0600 05/19/24 0600  NA 131*   < > 132* 131* 128* 130* 128* 129*  K 4.5   < > 3.8 4.0 3.6 3.7 4.0 3.6  CL 101   < > 103 101 97* 101 98 102  CO2 16*   < > 19* 17* 17* 17* 17* 17*  GLUCOSE 166*   < > 110* 114* 105* 103* 136* 95  BUN 107*   < > 77* 78* 69* 57* 73* 62*  CREATININE 2.26*   < > 1.89* 2.11* 1.92* 1.72* 2.08* 1.74*  CALCIUM  10.3   < > 9.7 10.2 9.8 9.7 9.7 10.0  MG 2.3  --   --   --   --   --   --   --   PHOS 5.8*  5.8*   < > 5.0* 5.1* 4.8* 4.8*  --  4.6   < > = values in this interval not displayed.   GFR: Estimated Creatinine Clearance: 46.8 mL/min (A) (by C-G formula based on SCr of 1.74 mg/dL (H)). Liver Function  Tests: Recent Labs  Lab 05/16/24 0326 05/17/24 0552 05/18/24 0207 05/18/24 0600 05/19/24 0600  AST  --   --   --  46*  --   ALT  --   --   --  74*  --   ALKPHOS  --   --   --  85  --   BILITOT  --   --   --  0.5  --   PROT  --   --   --  7.4  --   ALBUMIN  3.3* 3.1* 3.1* 3.1* 3.1*   No results for input(s): LIPASE, AMYLASE in the last 168 hours. No results for input(s): AMMONIA in the last 168 hours. Coagulation Profile: No results for input(s): INR, PROTIME in the last 168 hours. Cardiac Enzymes: No results for input(s): CKTOTAL, CKMB, CKMBINDEX, TROPONINI in the last 168 hours. BNP (last 3 results) No results for input(s): PROBNP in the last 8760 hours. HbA1C: No results for input(s): HGBA1C in the last 72 hours. CBG: Recent Labs  Lab 05/18/24 1618 05/18/24 2024 05/18/24 2329 05/19/24 0334 05/19/24 0746  GLUCAP 131* 158* 152* 84 112*   Lipid Profile: No results for input(s): CHOL, HDL, LDLCALC, TRIG, CHOLHDL, LDLDIRECT in the last 72 hours. Thyroid  Function Tests: No results for input(s): TSH, T4TOTAL, FREET4, T3FREE, THYROIDAB in the last 72 hours. Anemia Panel: No results for input(s): VITAMINB12, FOLATE, FERRITIN, TIBC, IRON , RETICCTPCT in  the last 72 hours. Sepsis Labs: No results for input(s): PROCALCITON, LATICACIDVEN in the last 168 hours.  No results found for this or any previous visit (from the past 240 hours).       Radiology Studies: No results found.       Scheduled Meds:  apixaban   5 mg Oral BID   Chlorhexidine  Gluconate Cloth  6 each Topical Daily   vitamin B-12  1,000 mcg Oral Daily   feeding supplement  237 mL Oral BID BM   feeding supplement (PROSource TF20)  60 mL Per Tube BID   insulin  aspart  0-20 Units Subcutaneous Q4H   insulin  glargine-yfgn  25 Units Subcutaneous QHS   loperamide   2 mg Oral BID   melatonin  5 mg Oral QHS   multivitamin with minerals  1 tablet Oral  Daily   nutrition supplement (JUVEN)  1 packet Oral BID BM   mouth rinse  15 mL Mouth Rinse 4 times per day   polycarbophil  625 mg Oral BID   rosuvastatin   10 mg Oral QPM   sodium chloride  flush  5 mL Intracatheter Q8H   Continuous Infusions:  sodium chloride  Stopped (05/01/24 0014)   albumin  human     anticoagulant sodium citrate        LOS: 39 days    Time spent: 47 minutes spent on 05/19/2024 caring for this patient face-to-face including chart review, ordering labs/tests, documenting, discussion with nursing staff, consultants, updating family and interview/physical exam    Camellia PARAS [place], DO [facility] Available via Epic secure chat 7am-7pm After these hours, please refer to coverage provider listed on [email or web address] 05/19/2024, 12:35 PM

## 2024-05-19 NOTE — Progress Notes (Signed)
 Pt self placing of home cpap unit. Instructed to call if assistance needed. RT will continue to monitor and be available as needed.

## 2024-05-19 NOTE — Progress Notes (Signed)
 05/19/2024  Kristopher Thompson [id number] [birth year]  CARE TEAM: PCP: [name], MD  Outpatient Care Team: Patient Care Team: [name], MD as PCP - General (Internal Medicine) [name], MD (Inactive) as Consulting Physician (Dermatology) [name], MD as Referring Physician (Pulmonary Disease) [name], MD as Consulting Physician (General Surgery) [name], MD as Consulting Physician (Urology)  Inpatient Treatment Team: Treatment Team:  [place], [name], DO [name], MD [name], MD Massie Delaine SAUNDERS, RN Dea Shiner, MD Ruthellen [facility], MD Luverne Aran, MD [name], MD [name], MD [name], NT [name], [name], RN Koonce, Natalie K, [place]   Problem List:   Principal Problem:   Delayed bowel perforation s/p SBR/end ileostomy Active Problems:   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 08/08/2017   S/P ileal conduit (HCC)   GERD (gastroesophageal reflux disease)   Obesity (BMI 35.0-39.9 without comorbidity)   Prolonged QT interval   Non-recurrent bilateral inguinal hernia without obstruction or gangrene   Incarcerated incisional hernia   Sinus tachycardia   Tachypnea   Acute respiratory insufficiency, postoperative   Sepsis due to undetermined organism (HCC)   Lactic acidosis   Class 2 obesity   Chronic anticoagulation   Hearing loss   History of bladder cancer   Obstructive sleep apnea of adult   Parastomal hernia of ileal conduit   Partial small bowel obstruction (HCC)   Personal history of PE (pulmonary embolism)   Pressure injury of skin   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   04/10/2024  POST-OPERATIVE DIAGNOSIS:  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   PROCEDURE:   -ROBOTIC LYSIS OF ADHESIONS X 4 HOURS -COMPONENT SEPARATION - TRANSVERSUS ABDOMINIS REALEASE (TAR)  BILATERAL -ROBOTIC REPAIRS OF  INCISIONAL, PARASTOMAL, LEFT INGUINAL  INCARCERATED ABDOMINAL WALL HERNIAS WITH MESH -UROSTOMY ILEAL CONDUIT REVISION -INTRAOPERATIVE ASSESSMENT OF TISSUE VASCULAR PERFUSION USING ICG (indocyanine green ) -IMMUNOFLUORESCENCE -TRANSVERSUS ABDOMINIS PLANE (TAP) BLOCK - BILATERAL    SURGEON:  [name], MD   04/20/2024 POST-OPERATIVE DIAGNOSIS:  perforated bowel   PROCEDURE:  Drainage of right abdominal wall abscess as well as intra-abdominal abscess Explantation of abdominal mesh Small bowel resection (in discontinuity) Placement of ABThera wound VAC   SURGEON:  [name], MD  04/21/2024  POST-OPERATIVE DIAGNOSIS:  DELAYED BOWEL PERFORATION WITH OPEN ABDOMEN   PROCEDURE:   LYSIS OF ADHESIONS X ABDOMINAL WALL DEBRIDEMENT ILEAL RESECTION END ILEOSTOMY ABDOMINAL WALL PARASTOMAL & INCISIONAL HERNIA REPAIRS WITH PHASIX MESH FASCIA CLOSURE WITH WOUND VAC PLACEMENT IN SQ   SURGEON:  [name], MD    05/04/2024 Interventional Radiology Procedure Note   Procedure: Bilateral percutaneous nephrostomy tube placements   Findings: Please refer to procedural dictation for full description. 10 Fr bilateral to bag drainage.   Complications: None immediate   Estimated Blood Loss: < 5 ml   Recommendations: Keep to bag drainage.  IR will follow.  Ultimate management as per Urology.   Assessment  -Incarcerated parastomal incisional hernias with obstructive symptoms status post robotic takedown and repair -Delayed small bowel perforation s/p ileal resection and end ileostomy -Delayed urine leak of urostomy ileal conduit status post perc nephrostomy tubes -Multisystem organ failure gradually resolving  SLOWLY IMPROVING   Plan:  Delayed urine leak with AKI:  Urinary diversion   Internal:  with percutaneous nephrostomy tubes.  Adjusted 6/30, 7/1, 7/3 with stent going from kidney out urostomy across the presumed mid ileal  on the  right side leak.  Urine draining into urostomy and right lower quadrant surgical Blake drain in the abdominal wall resting just outside the ileal conduit.  Left lower quadrant intraperitoneal Blake surgical drain with minimal output through the weekend.  I removed it.  Defer nephrostomy tubes or adjusting stenting to [name] Urology.  Suspect will need to keep those.  Hopefully the small ileal conduit leak will seal on its own with proper diversion.  We will see.  If not controlled with urinary diversion and stenting, he may require revision of his ileal conduit.  That would be very high risk.  Understand they wish to hold off but do not want to have nephrostomy tubes indefinitely either if possible.  Defer to Dr. Renda urology with interventional radiology   Serum BUN & creatinine slowly coming down.  Likelihood of repeat dialysis seems rather low.  Nephrology following more peripherally  Nutrition:  Diet as tolerated.  Nephrology recommends renal diet.  Eating better - follow  Bowel regimen for ileostomy.  soluble polyfiber/FiberCon to thicken with nightly   Weaned off TPN 6/16  I removed corpak 7/2  Hold off on G-tube unless poor PO effort >14days  Infection due to delayed bowel perforation and abdominal wall infection/necrosis  Growing multiple organisms including Candida albicans, Enterobacter, and Enterococcus.    IV Zosyn /micafungin  x6 weeks, completed 7/7.    Midline incision wound  would benefit with continued wound VAC   Metabolic encephalopathy resolved.  Follow.  Insomnia improved  ABLA on top of anemia of chronic disease improved with transfusion.  Follow.    -monitor electrolytes & replace as needed.  Keep K>4, Mg>2, Phos>3.    -Diabetes.  Sliding scale insulin .    -VTE prophylaxis-  Full anticoagulation given history of pulmonary embolism.  Eliquis .  -Mobilize as tolerated.  Starting to work with physical and Occupational Therapy more.  He tends  to refuse to get out of bed.  External encouraged him to get out of bed to rehab more.    I updated the patient's status to the the patient, wife, and nurses.  Recommendations were made.  Questions were answered.  They expressed understanding & appreciation.  -Disposition: Possible transition to inpatient rehab if makes goals. Vs SNF.  Stable to discharge from surgery standpoint.  See if urology feels any further adjustments to his nephrostomy tubes are needed prior to discharge later this week     I reviewed nursing notes, last 24 h vitals and pain scores, last 48 h intake and output, last 24 h labs and trends, and last 24 h imaging results.  I have reviewed this patient's available data, including medical history, events of note, test results, etc as part of my evaluation.   A significant portion of that time was spent in counseling. Care during the described time interval was provided by me.  This care required high  level of medical decision making.  05/19/2024    Subjective: (Chief complaint)  Patient had diarrhea with ileostomy and some leaking.  Just replaced.  Nursing and wife in room.  Patient starting to walk more.  Eating okay.    Objective:  Vital signs:  Vitals:   05/18/24 1620 05/18/24 2003 05/18/24 2330 05/19/24 0334  BP: 113/76 109/62 115/74 98/72  Pulse: 100 (!) 108 (!) 107 93  Resp: 18 18 18 16   Temp: 98.6 F (37 C) 98 F (36.7 C) 97.9 F (36.6 C) 97.6 F (36.4 C)  TempSrc: Oral Oral Oral  Oral  SpO2: 96% 97% 97% 98%  Weight:      Height:        Last BM Date : 05/18/24  Intake/Output   Yesterday:  07/07 0701 - 07/08 0700 In: 825.8 [P.O.:600; I.V.:5; IV Piggyback:195.8] Out: 1975 [Urine:1200; Drains:50; Stool:725] This shift:  No intake/output data recorded.  Bowel function:  Flatus: YES  BM:  YES  Drains:  RLQ drain (rests between abdominal wall and Phasix mesh): Serous low volume.  R & L back bephrostomy tubes in place.  - light  yellow-colored  Physical Exam:  General: Resting in no acute distress.  Legs crossed looking relaxed.  Mentally back to baseline - quiet but answers questions.  Eyes: PERRL, normal EOM.  Sclera clear.  No icterus Neuro: CN II-XII intact w/o focal sensory/motor deficits. Lymph: No head/neck/groin lymphadenopathy Psych:  No delerium/psychosis/paranoia.  No agitation HENT: Normocephalic, Mucus membranes moist.  No thrush. Mildly hard of hearing Neck: Supple, No tracheal deviation.  No obvious thyromegaly Chest: No pain to chest wall compression.  Good respiratory excursion.   CV:  Pulses intact.  Regular rhythm.  1-2+ BUE/BLE edema MS:  No obvious deformity  Abdomen:  Obese Soft.  Nondistended.  Nontender Wound vac (in SQ over closed fascia) in midline.    RUQ: (End ileostomy): Pink mucosa.  Green oatmeal thick effluent in bag RLQ:  (Urostomy ileal conduit): Pink mucosa without any obvious separation.  Moderate clear light yellow-colored urine.  Stent out ostomy  Ext:   No deformity.  No edema.  No cyanosis Skin: No petechiae / purpurea.  No major sores.  Warm and dry    Results:   Cultures: Recent Results (from the past 720 hours)  Culture, blood (Routine X 2) w Reflex to ID Panel     Status: None   Collection Time: 04/20/24  8:46 AM   Specimen: BLOOD RIGHT ARM  Result Value Ref Range Status   Specimen Description   Final    BLOOD RIGHT ARM Performed at [facility], [address], [place], [place] [address]    Special Requests   Final    BOTTLES DRAWN AEROBIC AND ANAEROBIC Blood Culture results may not be optimal due to an inadequate volume of blood received in culture bottles Performed at [facility], [address] [place], [place] [address]    Culture   Final    NO GROWTH 5 DAYS Performed at [facility], [address], [place], [place] [address]    Report Status 04/25/2024 FINAL  Final  Culture, blood (Routine X 2) w Reflex to ID  Panel     Status: None   Collection Time: 04/20/24  8:49 AM   Specimen: BLOOD  Result Value Ref Range Status   Specimen Description   Final    BLOOD Performed at [facility], [address], [place], [place] [address]    Special Requests   Final    BOTTLES DRAWN AEROBIC AND ANAEROBIC Blood Culture results may not be optimal due to an inadequate volume of blood received in culture bottles Performed at [facility], [address], [place], [place] [address]    Culture   Final    NO GROWTH 5 DAYS Performed at [facility], [address], [name], [place] [address]    Report Status 04/25/2024 FINAL  Final  Culture, Respiratory w Gram Stain     Status: None   Collection Time: 04/20/24 12:03  PM   Specimen: Tracheal Aspirate; Respiratory  Result Value Ref Range Status   Specimen Description   Final    TRACHEAL ASPIRATE Performed at [facility], [address] [place], [place] [address]    Special Requests   Final    NONE Performed at [facility], [address], [place], [place] [address]    Gram Stain NO WBC SEEN RARE GRAM POSITIVE COCCI   Final   Culture   Final    RARE Normal respiratory flora-no Staph aureus or Pseudomonas seen Performed at [facility], [address] [place], [place] [address]    Report Status 04/23/2024 FINAL  Final  Aerobic/Anaerobic Culture w Gram Stain (surgical/deep wound)     Status: None   Collection Time: 04/21/24  2:23 PM   Specimen: Soft Tissue, Other  Result Value Ref Range Status   Specimen Description   Final    TISSUE INTRA ABDOMINAL NECROTIC TISSUE Performed at [facility], [address] [place], [place] [address]    Special Requests   Final    NONE Performed at [facility], [address] [place], [place] [address]    Gram Stain   Final    FEW WBC PRESENT,BOTH PMN AND MONONUCLEAR RARE GRAM POSITIVE COCCI IN  PAIRS AND CHAINS RARE YEAST    Culture   Final    RARE ESCHERICHIA COLI FEW CANDIDA ALBICANS FEW ENTEROCOCCUS FAECALIS FEW BACTEROIDES SPECIES BETA LACTAMASE POSITIVE Performed at [facility], [address], [place], [place] [address]    Report Status 04/25/2024 FINAL  Final   Organism ID, Bacteria ESCHERICHIA COLI  Final   Organism ID, Bacteria ENTEROCOCCUS FAECALIS  Final      Susceptibility   Escherichia coli - MIC*    AMPICILLIN >=32 RESISTANT Resistant     CEFEPIME  <=0.12 SENSITIVE Sensitive     CEFTAZIDIME <=1 SENSITIVE Sensitive     CEFTRIAXONE  1 SENSITIVE Sensitive     CIPROFLOXACIN  <=0.25 SENSITIVE Sensitive     GENTAMICIN  <=1 SENSITIVE Sensitive     IMIPENEM <=0.25 SENSITIVE Sensitive     TRIMETH /SULFA  <=20 SENSITIVE Sensitive     AMPICILLIN/SULBACTAM >=32 RESISTANT Resistant     PIP/TAZO 8 SENSITIVE Sensitive ug/mL    * RARE ESCHERICHIA COLI   Enterococcus faecalis - MIC*    AMPICILLIN <=2 SENSITIVE Sensitive     VANCOMYCIN  1 SENSITIVE Sensitive     GENTAMICIN  SYNERGY SENSITIVE Sensitive     * FEW ENTEROCOCCUS FAECALIS    Labs: Results for orders placed or performed during the hospital encounter of 04/10/24 (from the past 48 hours)  Creatinine, fluid (pleural, peritoneal, JP Drainage)     Status: None   Collection Time: 05/17/24  8:29 AM  Result Value Ref Range   Creat, Fluid >50.0 mg/dL    Comment: RESULT CONFIRMED BY MANUAL DILUTION (NOTE) No normal range established for this test Results should be evaluated in conjunction with serum values    Fluid Type-FCRE JP DRAINAGE     Comment: Performed at [facility], [address] [place] [address] [place], [place] [address]  Glucose, capillary     Status: Abnormal   Collection Time: 05/17/24 11:21 AM  Result Value Ref Range   Glucose-Capillary 155 (H) 70 - 99 mg/dL    Comment: Glucose reference range applies only to samples taken after fasting for at least 8 hours.   Comment 1 Document in Chart   Glucose,  capillary  Status: Abnormal   Collection Time: 05/17/24  3:49 PM  Result Value Ref Range   Glucose-Capillary 117 (H) 70 - 99 mg/dL    Comment: Glucose reference range applies only to samples taken after fasting for at least 8 hours.  Glucose, capillary     Status: Abnormal   Collection Time: 05/17/24  7:09 PM  Result Value Ref Range   Glucose-Capillary 176 (H) 70 - 99 mg/dL    Comment: Glucose reference range applies only to samples taken after fasting for at least 8 hours.  Glucose, capillary     Status: Abnormal   Collection Time: 05/17/24  7:39 PM  Result Value Ref Range   Glucose-Capillary 161 (H) 70 - 99 mg/dL    Comment: Glucose reference range applies only to samples taken after fasting for at least 8 hours.  Glucose, capillary     Status: Abnormal   Collection Time: 05/17/24 11:13 PM  Result Value Ref Range   Glucose-Capillary 137 (H) 70 - 99 mg/dL    Comment: Glucose reference range applies only to samples taken after fasting for at least 8 hours.   Comment 1 Notify RN    Comment 2 Document in Chart   Prealbumin     Status: None   Collection Time: 05/18/24  1:59 AM  Result Value Ref Range   Prealbumin 38 18 - 38 mg/dL    Comment: Performed at [facility], [address], [place], [place] [address]  Renal function panel     Status: Abnormal   Collection Time: 05/18/24  2:07 AM  Result Value Ref Range   Sodium 130 (L) 135 - 145 mmol/L   Potassium 3.7 3.5 - 5.1 mmol/L   Chloride 101 98 - 111 mmol/L   CO2 17 (L) 22 - 32 mmol/L   Glucose, Bld 103 (H) 70 - 99 mg/dL    Comment: Glucose reference range applies only to samples taken after fasting for at least 8 hours.   BUN 57 (H) 8 - 23 mg/dL   Creatinine, Ser 8.27 (H) 0.61 - 1.24 mg/dL   Calcium  9.7 8.9 - 10.3 mg/dL   Phosphorus 4.8 (H) 2.5 - 4.6 mg/dL   Albumin  3.1 (L) 3.5 - 5.0 g/dL   GFR, Estimated 42 (L) >60 mL/min    Comment: (NOTE) Calculated using the CKD-EPI Creatinine Equation (2021)    Anion gap 12 5  - 15    Comment: Performed at [facility], [address], [place], [place] [address]  Glucose, capillary     Status: Abnormal   Collection Time: 05/18/24  3:04 AM  Result Value Ref Range   Glucose-Capillary 109 (H) 70 - 99 mg/dL    Comment: Glucose reference range applies only to samples taken after fasting for at least 8 hours.   Comment 1 Notify RN    Comment 2 Document in Chart   CBC     Status: Abnormal   Collection Time: 05/18/24  6:00 AM  Result Value Ref Range   WBC 10.4 4.0 - 10.5 K/uL   RBC 4.29 4.22 - 5.81 MIL/uL   Hemoglobin 13.1 13.0 - 17.0 g/dL   HCT 60.9 60.9 - 47.9 %   MCV 90.9 80.0 - 100.0 fL   MCH 30.5 26.0 - 34.0 pg   MCHC 33.6 30.0 - 36.0 g/dL   RDW 82.5 (H) 88.4 - 84.4 %   Platelets 313 150 - 400 K/uL   nRBC 0.0 0.0 - 0.2 %  Comment: Performed at [facility], [address], [place], [place] [address]  Comprehensive metabolic panel with GFR     Status: Abnormal   Collection Time: 05/18/24  6:00 AM  Result Value Ref Range   Sodium 128 (L) 135 - 145 mmol/L   Potassium 4.0 3.5 - 5.1 mmol/L   Chloride 98 98 - 111 mmol/L   CO2 17 (L) 22 - 32 mmol/L   Glucose, Bld 136 (H) 70 - 99 mg/dL    Comment: Glucose reference range applies only to samples taken after fasting for at least 8 hours.   BUN 73 (H) 8 - 23 mg/dL   Creatinine, Ser 7.91 (H) 0.61 - 1.24 mg/dL   Calcium  9.7 8.9 - 10.3 mg/dL   Total Protein 7.4 6.5 - 8.1 g/dL   Albumin  3.1 (L) 3.5 - 5.0 g/dL   AST 46 (H) 15 - 41 U/L   ALT 74 (H) 0 - 44 U/L   Alkaline Phosphatase 85 38 - 126 U/L   Total Bilirubin 0.5 0.0 - 1.2 mg/dL   GFR, Estimated 33 (L) >60 mL/min    Comment: (NOTE) Calculated using the CKD-EPI Creatinine Equation (2021)    Anion gap 13 5 - 15    Comment: Performed at [facility], [address] [place], [place] [address]  Glucose, capillary     Status: Abnormal   Collection Time: 05/18/24  7:52 AM  Result Value Ref Range   Glucose-Capillary 105 (H) 70 - 99 mg/dL     Comment: Glucose reference range applies only to samples taken after fasting for at least 8 hours.  Glucose, capillary     Status: Abnormal   Collection Time: 05/18/24 12:09 PM  Result Value Ref Range   Glucose-Capillary 150 (H) 70 - 99 mg/dL    Comment: Glucose reference range applies only to samples taken after fasting for at least 8 hours.  Glucose, capillary     Status: Abnormal   Collection Time: 05/18/24  4:18 PM  Result Value Ref Range   Glucose-Capillary 131 (H) 70 - 99 mg/dL    Comment: Glucose reference range applies only to samples taken after fasting for at least 8 hours.  Glucose, capillary     Status: Abnormal   Collection Time: 05/18/24  8:24 PM  Result Value Ref Range   Glucose-Capillary 158 (H) 70 - 99 mg/dL    Comment: Glucose reference range applies only to samples taken after fasting for at least 8 hours.  Glucose, capillary     Status: Abnormal   Collection Time: 05/18/24 11:29 PM  Result Value Ref Range   Glucose-Capillary 152 (H) 70 - 99 mg/dL    Comment: Glucose reference range applies only to samples taken after fasting for at least 8 hours.  Glucose, capillary     Status: None   Collection Time: 05/19/24  3:34 AM  Result Value Ref Range   Glucose-Capillary 84 70 - 99 mg/dL    Comment: Glucose reference range applies only to samples taken after fasting for at least 8 hours.  Renal function panel     Status: Abnormal   Collection Time: 05/19/24  6:00 AM  Result Value Ref Range   Sodium 129 (L) 135 - 145 mmol/L   Potassium 3.6 3.5 - 5.1 mmol/L   Chloride 102 98 - 111 mmol/L   CO2 17 (L) 22 - 32 mmol/L   Glucose, Bld 95 70 - 99 mg/dL    Comment: Glucose reference range  applies only to samples taken after fasting for at least 8 hours.   BUN 62 (H) 8 - 23 mg/dL   Creatinine, Ser 8.25 (H) 0.61 - 1.24 mg/dL   Calcium  10.0 8.9 - 10.3 mg/dL   Phosphorus 4.6 2.5 - 4.6 mg/dL   Albumin  3.1 (L) 3.5 - 5.0 g/dL   GFR, Estimated 41 (L) >60 mL/min    Comment:  (NOTE) Calculated using the CKD-EPI Creatinine Equation (2021)    Anion gap 10 5 - 15    Comment: Performed at [facility], [address] [place], [place] [address]  Glucose, capillary     Status: Abnormal   Collection Time: 05/19/24  7:46 AM  Result Value Ref Range   Glucose-Capillary 112 (H) 70 - 99 mg/dL    Comment: Glucose reference range applies only to samples taken after fasting for at least 8 hours.    Imaging / Studies: No results found.       Medications / Allergies: per chart  Antibiotics: Anti-infectives (From admission, onward)    Start     Dose/Rate Route Frequency Ordered Stop   05/06/24 1400  piperacillin -tazobactam (ZOSYN ) IVPB 3.375 g        3.375 g 12.5 mL/hr over 240 Minutes Intravenous Every 8 hours 05/06/24 1227 05/19/24 0147   05/01/24 1400  piperacillin -tazobactam (ZOSYN ) IVPB 2.25 g  Status:  Discontinued        2.25 g 100 mL/hr over 30 Minutes Intravenous Every 8 hours 05/01/24 0957 05/06/24 1227   05/01/24 1045  micafungin  (MYCAMINE ) 100 mg in sodium chloride  0.9 % 100 mL IVPB        100 mg 105 mL/hr over 1 Hours Intravenous Every 24 hours 05/01/24 0949 05/18/24 1651   04/29/24 1000  micafungin  (MYCAMINE ) 150 mg in sodium chloride  0.9 % 100 mL IVPB  Status:  Discontinued        150 mg 107.5 mL/hr over 1 Hours Intravenous Every 24 hours 04/28/24 1036 05/01/24 0949   04/28/24 1345  micafungin  (MYCAMINE ) 100 mg in sodium chloride  0.9 % 100 mL IVPB        100 mg 105 mL/hr over 1 Hours Intravenous  Once 04/28/24 1245 04/28/24 1354   04/28/24 1000  micafungin  (MYCAMINE ) 100 mg in sodium chloride  0.9 % 100 mL IVPB  Status:  Discontinued        100 mg 105 mL/hr over 1 Hours Intravenous Every 24 hours 04/27/24 1219 04/28/24 1036   04/21/24 1400  clindamycin  (CLEOCIN ) 900 mg, gentamicin  (GARAMYCIN ) 240 mg in sodium chloride  0.9 % 1,000 mL for intraperitoneal lavage  Status:  Discontinued         Irrigation To Surgery 04/21/24 1346 04/21/24 1618    04/20/24 1400  piperacillin -tazobactam (ZOSYN ) IVPB 3.375 g  Status:  Discontinued        3.375 g 12.5 mL/hr over 240 Minutes Intravenous Every 8 hours 04/20/24 0755 05/01/24 0957   04/20/24 1000  metroNIDAZOLE  (FLAGYL ) IVPB 500 mg  Status:  Discontinued        500 mg 100 mL/hr over 60 Minutes Intravenous Every 12 hours 04/20/24 0320 04/20/24 0752   04/20/24 1000  micafungin  (MYCAMINE ) 150 mg in sodium chloride  0.9 % 100 mL IVPB  Status:  Discontinued        150 mg 107.5 mL/hr over 1 Hours Intravenous Every 24 hours 04/20/24 0752 04/27/24 1219   04/20/24 0245  ceFEPIme  (MAXIPIME ) 2 g in sodium chloride  0.9 % 100 mL IVPB  Status:  Discontinued        2 g 200 mL/hr over 30 Minutes Intravenous Every 8 hours 04/20/24 0232 04/20/24 0752   04/20/24 0100  metroNIDAZOLE  (FLAGYL ) IVPB 500 mg        500 mg 100 mL/hr over 60 Minutes Intravenous On call to O.R. 04/20/24 0004 04/20/24 0100   04/14/24 1800  ceFEPIme  (MAXIPIME ) 2 g in sodium chloride  0.9 % 100 mL IVPB        2 g 200 mL/hr over 30 Minutes Intravenous Every 8 hours 04/14/24 1003 04/19/24 0957   04/14/24 1600  vancomycin  (VANCOCIN ) IVPB 1000 mg/200 mL premix  Status:  Discontinued        1,000 mg 200 mL/hr over 60 Minutes Intravenous Every 24 hours 04/13/24 1420 04/14/24 1000   04/14/24 1600  vancomycin  (VANCOREADY) IVPB 1500 mg/300 mL  Status:  Discontinued        1,500 mg 150 mL/hr over 120 Minutes Intravenous Every 24 hours 04/14/24 1002 04/16/24 0744   04/14/24 1200  vancomycin  (VANCOCIN ) IVPB 1000 mg/200 mL premix  Status:  Discontinued        1,000 mg 200 mL/hr over 60 Minutes Intravenous Every 24 hours 04/13/24 1053 04/13/24 1109   04/13/24 2200  ceFEPIme  (MAXIPIME ) 2 g in sodium chloride  0.9 % 100 mL IVPB  Status:  Discontinued        2 g 200 mL/hr over 30 Minutes Intravenous Every 12 hours 04/13/24 1036 04/13/24 1109   04/13/24 2200  ceFEPIme  (MAXIPIME ) 2 g in sodium chloride  0.9 % 100 mL IVPB  Status:  Discontinued        2  g 200 mL/hr over 30 Minutes Intravenous Every 12 hours 04/13/24 1425 04/14/24 1003   04/13/24 1500  vancomycin  (VANCOCIN ) 2,000 mg in sodium chloride  0.9 % 500 mL IVPB        2,000 mg 260 mL/hr over 120 Minutes Intravenous  Once 04/13/24 1409 04/13/24 1850   04/13/24 1500  ceFEPIme  (MAXIPIME ) 2 g in sodium chloride  0.9 % 100 mL IVPB  Status:  Discontinued        2 g 200 mL/hr over 30 Minutes Intravenous Every 12 hours 04/13/24 1420 04/13/24 1425   04/13/24 1500  metroNIDAZOLE  (FLAGYL ) IVPB 500 mg  Status:  Discontinued        500 mg 100 mL/hr over 60 Minutes Intravenous Every 12 hours 04/13/24 1420 04/17/24 0725   04/13/24 1200  vancomycin  (VANCOCIN ) 2,000 mg in sodium chloride  0.9 % 500 mL IVPB  Status:  Discontinued        2,000 mg 260 mL/hr over 120 Minutes Intravenous  Once 04/13/24 1052 04/13/24 1121   04/13/24 1100  metroNIDAZOLE  (FLAGYL ) IVPB 500 mg  Status:  Discontinued        500 mg 100 mL/hr over 60 Minutes Intravenous 2 times daily 04/13/24 1007 04/13/24 1109   04/13/24 1100  vancomycin  (VANCOCIN ) IVPB 1000 mg/200 mL premix  Status:  Discontinued        1,000 mg 200 mL/hr over 60 Minutes Intravenous  Once 04/13/24 1007 04/13/24 1020   04/13/24 1015  ceFEPIme  (MAXIPIME ) 2 g in sodium chloride  0.9 % 100 mL IVPB        2 g 200 mL/hr over 30 Minutes Intravenous STAT 04/13/24 1007 04/14/24 1721   04/10/24 2200  ceFAZolin  (ANCEF ) IVPB 2g/100 mL premix        2 g 200 mL/hr over 30 Minutes Intravenous Every 8 hours 04/10/24 1833 04/11/24 0531   04/10/24  0600  ceFAZolin  (ANCEF ) IVPB 2g/100 mL premix        2 g 200 mL/hr over 30 Minutes Intravenous On call to O.R. 04/10/24 0533 04/10/24 1526         Note: Portions of this report may have been transcribed using voice recognition software. Every effort was made to ensure accuracy; however, inadvertent computerized transcription errors may be present.   Any transcriptional errors that result from this process are  unintentional.    Elspeth KYM Schultze, MD, FACS, MASCRS Esophageal, Gastrointestinal & Colorectal Surgery Robotic and Minimally Invasive Surgery  [facility] [address], [address] [place], [place] [address] [phone] Fax [phone] Main  CONTACT INFORMATION: Weekday (9AM-5PM): Call [facility] main office at [phone] Weeknight (5PM-9AM) or Weekend/Holiday: Check EPIC Web Links tab & use AMION (password  [email or web address]) for General Surgery [facility] coverage  Please, DO NOT use SecureChat  (it is not reliable communication to reach operating surgeons & will lead to a delay in care).   Epic staff messaging available for outptient concerns needing 1-2 business day response.      05/19/2024  7:53 AM

## 2024-05-20 DIAGNOSIS — J9589 Other postprocedural complications and disorders of respiratory system, not elsewhere classified: Secondary | ICD-10-CM | POA: Diagnosis not present

## 2024-05-20 DIAGNOSIS — Z9889 Other specified postprocedural states: Secondary | ICD-10-CM | POA: Insufficient documentation

## 2024-05-20 DIAGNOSIS — Z932 Ileostomy status: Secondary | ICD-10-CM

## 2024-05-20 DIAGNOSIS — N9989 Other postprocedural complications and disorders of genitourinary system: Secondary | ICD-10-CM

## 2024-05-20 DIAGNOSIS — E66812 Obesity, class 2: Secondary | ICD-10-CM | POA: Diagnosis not present

## 2024-05-20 DIAGNOSIS — K631 Perforation of intestine (nontraumatic): Secondary | ICD-10-CM | POA: Diagnosis not present

## 2024-05-20 DIAGNOSIS — C679 Malignant neoplasm of bladder, unspecified: Secondary | ICD-10-CM | POA: Diagnosis not present

## 2024-05-20 LAB — CBC WITH DIFFERENTIAL/PLATELET
Abs Immature Granulocytes: 0.19 K/uL — ABNORMAL HIGH (ref 0.00–0.07)
Basophils Absolute: 0.1 K/uL (ref 0.0–0.1)
Basophils Relative: 1 %
Eosinophils Absolute: 0.4 K/uL (ref 0.0–0.5)
Eosinophils Relative: 4 %
HCT: 40.1 % (ref 39.0–52.0)
Hemoglobin: 13.7 g/dL (ref 13.0–17.0)
Immature Granulocytes: 2 %
Lymphocytes Relative: 16 %
Lymphs Abs: 1.6 K/uL (ref 0.7–4.0)
MCH: 30.5 pg (ref 26.0–34.0)
MCHC: 34.2 g/dL (ref 30.0–36.0)
MCV: 89.3 fL (ref 80.0–100.0)
Monocytes Absolute: 1.4 K/uL — ABNORMAL HIGH (ref 0.1–1.0)
Monocytes Relative: 14 %
Neutro Abs: 6.6 K/uL (ref 1.7–7.7)
Neutrophils Relative %: 63 %
Platelets: 316 K/uL (ref 150–400)
RBC: 4.49 MIL/uL (ref 4.22–5.81)
RDW: 16.4 % — ABNORMAL HIGH (ref 11.5–15.5)
WBC: 10.3 K/uL (ref 4.0–10.5)
nRBC: 0 % (ref 0.0–0.2)

## 2024-05-20 LAB — RENAL FUNCTION PANEL
Albumin: 3.2 g/dL — ABNORMAL LOW (ref 3.5–5.0)
Anion gap: 11 (ref 5–15)
BUN: 69 mg/dL — ABNORMAL HIGH (ref 8–23)
CO2: 17 mmol/L — ABNORMAL LOW (ref 22–32)
Calcium: 10 mg/dL (ref 8.9–10.3)
Chloride: 99 mmol/L (ref 98–111)
Creatinine, Ser: 1.94 mg/dL — ABNORMAL HIGH (ref 0.61–1.24)
GFR, Estimated: 36 mL/min — ABNORMAL LOW (ref >60)
Glucose, Bld: 104 mg/dL — ABNORMAL HIGH (ref 70–99)
Phosphorus: 4.8 mg/dL — ABNORMAL HIGH (ref 2.5–4.6)
Potassium: 3.8 mmol/L (ref 3.5–5.1)
Sodium: 127 mmol/L — ABNORMAL LOW (ref 135–145)

## 2024-05-20 LAB — GLUCOSE, CAPILLARY
Glucose-Capillary: 104 mg/dL — ABNORMAL HIGH (ref 70–99)
Glucose-Capillary: 111 mg/dL — ABNORMAL HIGH (ref 70–99)
Glucose-Capillary: 114 mg/dL — ABNORMAL HIGH (ref 70–99)
Glucose-Capillary: 126 mg/dL — ABNORMAL HIGH (ref 70–99)
Glucose-Capillary: 128 mg/dL — ABNORMAL HIGH (ref 70–99)
Glucose-Capillary: 137 mg/dL — ABNORMAL HIGH (ref 70–99)
Glucose-Capillary: 172 mg/dL — ABNORMAL HIGH (ref 70–99)

## 2024-05-20 MED ORDER — METOCLOPRAMIDE HCL 5 MG PO TABS
5.0000 mg | ORAL_TABLET | Freq: Three times a day (TID) | ORAL | Status: DC
  Administered 2024-05-20 – 2024-05-22 (×8): 5 mg via ORAL
  Filled 2024-05-20 (×9): qty 1

## 2024-05-20 MED ORDER — CALCIUM POLYCARBOPHIL 625 MG PO TABS: 625.0000 mg | ORAL_TABLET | Freq: Two times a day (BID) | ORAL | 2 refills | Status: DC

## 2024-05-20 MED ORDER — METOCLOPRAMIDE HCL 5 MG PO TABS: 5.0000 mg | ORAL_TABLET | Freq: Three times a day (TID) | ORAL | 2 refills | Status: DC | PRN

## 2024-05-20 NOTE — Progress Notes (Signed)
 05/20/2024  Kristopher Thompson [id number] 17-Oct-1951  CARE TEAM: PCP: [name], MD  Outpatient Care Team: Patient Care Team: [name], MD as PCP - General (Internal Medicine) [name], MD (Inactive) as Consulting Physician (Dermatology) [name], MD as Referring Physician (Pulmonary Disease) [name], MD as Consulting Physician (General Surgery) [name], MD as Consulting Physician (Urology)  Inpatient Treatment Team: Treatment Team:  Sherrill Alejandro Donovan, DO [name], MD [name], MD Massie Delaine SAUNDERS, RN Dea Shiner, MD Ruthellen [facility], MD Luverne Aran, MD [name], MD [name], NT Lavelle Marilyn BROCKS, RN [name], MD Lorrene Theo HERO, PT   Problem List:   Principal Problem:   Delayed bowel perforation s/p SBR/end ileostomy Active Problems:   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 08/08/2017   S/P ileal conduit (HCC)   GERD (gastroesophageal reflux disease)   Obesity (BMI 35.0-39.9 without comorbidity)   Prolonged QT interval   Non-recurrent bilateral inguinal hernia without obstruction or gangrene   Incarcerated incisional hernia   Sinus tachycardia   Tachypnea   Acute respiratory insufficiency, postoperative   Sepsis due to undetermined organism (HCC)   Lactic acidosis   Class 2 obesity   Chronic anticoagulation   Hearing loss   History of bladder cancer   Obstructive sleep apnea of adult   Parastomal hernia of ileal conduit   Partial small bowel obstruction (HCC)   Personal history of PE (pulmonary embolism)   Pressure injury of skin   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   04/10/2024  POST-OPERATIVE DIAGNOSIS:  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   PROCEDURE:   -ROBOTIC LYSIS OF ADHESIONS X 4 HOURS -COMPONENT SEPARATION - TRANSVERSUS ABDOMINIS REALEASE (TAR) BILATERAL -ROBOTIC REPAIRS  OF  INCISIONAL, PARASTOMAL, LEFT INGUINAL  INCARCERATED ABDOMINAL WALL HERNIAS WITH MESH -UROSTOMY ILEAL CONDUIT REVISION -INTRAOPERATIVE ASSESSMENT OF TISSUE VASCULAR PERFUSION USING ICG (indocyanine green ) -IMMUNOFLUORESCENCE -TRANSVERSUS ABDOMINIS PLANE (TAP) BLOCK - BILATERAL    SURGEON:  [name], MD   04/20/2024 POST-OPERATIVE DIAGNOSIS:  perforated bowel   PROCEDURE:  Drainage of right abdominal wall abscess as well as intra-abdominal abscess Explantation of abdominal mesh Small bowel resection (in discontinuity) Placement of ABThera wound VAC   SURGEON:  [name], MD  04/21/2024  POST-OPERATIVE DIAGNOSIS:  DELAYED BOWEL PERFORATION WITH OPEN ABDOMEN   PROCEDURE:   LYSIS OF ADHESIONS X ABDOMINAL WALL DEBRIDEMENT ILEAL RESECTION END ILEOSTOMY ABDOMINAL WALL PARASTOMAL & INCISIONAL HERNIA REPAIRS WITH PHASIX MESH FASCIA CLOSURE WITH WOUND VAC PLACEMENT IN SQ   SURGEON:  [name], MD    05/04/2024 Interventional Radiology Procedure Note   Procedure: Bilateral percutaneous nephrostomy tube placements   Findings: Please refer to procedural dictation for full description. 10 Fr bilateral to bag drainage.   Complications: None immediate   Estimated Blood Loss: < 5 ml   Recommendations: Keep to bag drainage.  IR will follow.  Ultimate management as per Urology.   Assessment  -Incarcerated parastomal incisional hernias with obstructive symptoms status post robotic takedown and repair -Delayed small bowel perforation s/p ileal resection and end ileostomy -Delayed urine leak of urostomy ileal conduit status post perc nephrostomy tubes -Multisystem organ failure gradually resolving  SLOWLY IMPROVING   Plan:  Delayed urine leak with AKI:  Urinary diversion   Internal:  with percutaneous nephrostomy tubes.  Adjusted 6/30, 7/1, 7/3 with stent going from kidney out urostomy across the presumed mid ileal on the right side  leak.  Urine  draining into urostomy and right lower quadrant surgical Blake drain in the abdominal wall resting just outside the ileal conduit.  Left lower quadrant intraperitoneal Blake surgical drain with minimal output through the weekend.  I removed it 7/8.  Nephrostomy tubes & stenting per [name] [facility].    Hopefully the small ileal conduit leak will seal on its own with internal stenting and external drainage and improve nutrition.    If not controlled with urinary diversion and stenting, he may require revision of his ileal conduit.  That would be very high risk.  Understand they wish to hold off but do not want to have nephrostomy tubes indefinitely either if possible.  Defer to Dr. Renda urology with interventional radiology   Serum BUN & creatinine slowly coming down.  Likelihood of repeat dialysis seems rather low.  Nephrology following more peripherally  Nutrition:  PO Diet as tolerated.  Patient does not like renal diet and wishes to get back to a regular diet.  I wrote to switch to see how that goes.  If numbers worsening, may need to go back to a more restricted diet.  See what medicine/nephrology think  Still with intermittent nausea.  Will add low-dose metoclopramide  to see if that helps.  Normally I would do ondansetron  but given QT concerns will hold off  Bowel regimen for ileostomy.  soluble polyfiber/FiberCon to thicken.  Added loperamide  to help constipate.  Twice daily seems to be worse  Weaned off TPN 6/16  Corpak removed 7/2  Hold off on G-tube unless poor PO effort >14days  Infection due to delayed bowel perforation and abdominal wall infection/necrosis  Growing multiple organisms including Candida albicans, Enterobacter, and Enterococcus.    IV Zosyn /micafungin  x6 weeks, completed 7/7.    Midline incision wound  would benefit with continued wound VAC   Metabolic encephalopathy resolved.  Follow.  Insomnia improved  ABLA on top of anemia of chronic  disease improved with transfusion.  Follow.    -monitor electrolytes & replace as needed.  Keep K>4, Mg>2, Phos>3.    -Diabetes.  Sliding scale insulin .    -VTE prophylaxis-  Full anticoagulation given history of pulmonary embolism.  Eliquis .  -Mobilize as tolerated.  Starting to work with physical and Occupational Therapy more.  He tends to refuse to get out of bed.  External encouraged him to get out of bed to rehab more.    I updated the patient's status to the the patient, wife, and nurses.  Recommendations were made.  Questions were answered.  They expressed understanding & appreciation.  -Disposition: Possible transition to inpatient rehab if makes goals. Vs SNF.  Stable to discharge from surgery standpoint.  See if urology feels any further adjustments to his nephrostomy tubes are needed prior to discharge later this week     I reviewed nursing notes, last 24 h vitals and pain scores, last 48 h intake and output, last 24 h labs and trends, and last 24 h imaging results.  I have reviewed this patient's available data, including medical history, events of note, test results, etc as part of my evaluation.   A significant portion of that time was spent in counseling. Care during the described time interval was provided by me.  This care required high  level of medical decision making.  05/20/2024    Subjective: (Chief complaint)  No major events.  Wife at bedside.  Patient still having nausea and does not like renal diet.  Wants a  regular diet.  He is standing but is reluctant to do more than a few steps.    Objective:  Vital signs:  Vitals:   05/19/24 1553 05/19/24 1937 05/19/24 2356 05/20/24 0256  BP: 99/66 92/67 104/71 100/72  Pulse:   97 92  Resp: 20  20 20   Temp: 98 F (36.7 C)  97.8 F (36.6 C) 97.6 F (36.4 C)  TempSrc: Oral  Oral Oral  SpO2: 97%  97% 97%  Weight:      Height:        Last BM Date : 05/20/24  Intake/Output   Yesterday:  07/08 0701  - 07/09 0700 In: 1465 [P.O.:1440; I.V.:5] Out: 1730 [Urine:900; Drains:110; Stool:720] This shift:  No intake/output data recorded.  Bowel function:  Flatus: YES  BM:  YES  Drains:  RLQ drain (rests between abdominal wall and Phasix mesh): Serous low volume. R & L back nephrostomy tubes in place.  - light yellow-colored -scant on left  Physical Exam:  General: Resting in no acute distress.  Legs crossed looking relaxed.  Mentally back to baseline - quiet but answers questions.  Eyes: PERRL, normal EOM.  Sclera clear.  No icterus Neuro: CN II-XII intact w/o focal sensory/motor deficits. Lymph: No head/neck/groin lymphadenopathy Psych:  No delerium/psychosis/paranoia.  No agitation HENT: Normocephalic, Mucus membranes moist.  No thrush. Mildly hard of hearing Neck: Supple, No tracheal deviation.  No obvious thyromegaly Chest: No pain to chest wall compression.  Good respiratory excursion.   CV:  Pulses intact.  Regular rhythm.  1-2+ BUE/BLE edema MS:  No obvious deformity  Abdomen:  Obese Soft.  Nondistended.  Nontender Wound vac (in SQ over closed fascia) in midline.    RUQ: (End ileostomy): Pink mucosa.  Brown thicker effluent in bag RLQ:  (Urostomy ileal conduit): Pink mucosa without any obvious separation.  Moderate clear light orange.  Stent out ostomy  Ext:   No deformity.  No edema.  No cyanosis Skin: No petechiae / purpurea.  No major sores.  Warm and dry    Results:   Cultures: Recent Results (from the past 720 hours)  Culture, blood (Routine X 2) w Reflex to ID Panel     Status: None   Collection Time: 04/20/24  8:46 AM   Specimen: BLOOD RIGHT ARM  Result Value Ref Range Status   Specimen Description   Final    BLOOD RIGHT ARM Performed at [facility], [address] [place], [place] [address]    Special Requests   Final    BOTTLES DRAWN AEROBIC AND ANAEROBIC Blood Culture results may not be optimal due to an inadequate volume of blood received in  culture bottles Performed at [facility], [address], [place], [place] [address]    Culture   Final    NO GROWTH 5 DAYS Performed at [facility], [address], [place], [place] [address]    Report Status 04/25/2024 FINAL  Final  Culture, blood (Routine X 2) w Reflex to ID Panel     Status: None   Collection Time: 04/20/24  8:49 AM   Specimen: BLOOD  Result Value Ref Range Status   Specimen Description   Final    BLOOD Performed at [facility], [address] [place], [place] [address]    Special Requests   Final    BOTTLES DRAWN AEROBIC AND ANAEROBIC Blood Culture results may not be optimal due to an inadequate volume of blood  received in culture bottles Performed at [facility], [address], [place], [place] [address]    Culture   Final    NO GROWTH 5 DAYS Performed at [facility], [address] [place], [place] [address]    Report Status 04/25/2024 FINAL  Final  Culture, Respiratory w Gram Stain     Status: None   Collection Time: 04/20/24 12:03 PM   Specimen: Tracheal Aspirate; Respiratory  Result Value Ref Range Status   Specimen Description   Final    TRACHEAL ASPIRATE Performed at [facility], [address], [place], [place] [address]    Special Requests   Final    NONE Performed at [facility], [address] [place], [place] [address]    Gram Stain NO WBC SEEN RARE GRAM POSITIVE COCCI   Final   Culture   Final    RARE Normal respiratory flora-no Staph aureus or Pseudomonas seen Performed at [facility], [address] [place], [place] [address]    Report Status 04/23/2024 FINAL  Final  Aerobic/Anaerobic Culture w Gram Stain (surgical/deep wound)     Status: None   Collection Time: 04/21/24  2:23 PM   Specimen: Soft Tissue, Other  Result Value Ref Range Status   Specimen Description   Final    TISSUE INTRA ABDOMINAL NECROTIC  TISSUE Performed at [facility], [address], [place], [place] [address]    Special Requests   Final    NONE Performed at [facility], [address], [place], [place] [address]    Gram Stain   Final    FEW WBC PRESENT,BOTH PMN AND MONONUCLEAR RARE GRAM POSITIVE COCCI IN PAIRS AND CHAINS RARE YEAST    Culture   Final    RARE ESCHERICHIA COLI FEW CANDIDA ALBICANS FEW ENTEROCOCCUS FAECALIS FEW BACTEROIDES SPECIES BETA LACTAMASE POSITIVE Performed at [facility], [address] [place], [place] [address]    Report Status 04/25/2024 FINAL  Final   Organism ID, Bacteria ESCHERICHIA COLI  Final   Organism ID, Bacteria ENTEROCOCCUS FAECALIS  Final      Susceptibility   Escherichia coli - MIC*    AMPICILLIN >=32 RESISTANT Resistant     CEFEPIME  <=0.12 SENSITIVE Sensitive     CEFTAZIDIME <=1 SENSITIVE Sensitive     CEFTRIAXONE  1 SENSITIVE Sensitive     CIPROFLOXACIN  <=0.25 SENSITIVE Sensitive     GENTAMICIN  <=1 SENSITIVE Sensitive     IMIPENEM <=0.25 SENSITIVE Sensitive     TRIMETH /SULFA  <=20 SENSITIVE Sensitive     AMPICILLIN/SULBACTAM >=32 RESISTANT Resistant     PIP/TAZO 8 SENSITIVE Sensitive ug/mL    * RARE ESCHERICHIA COLI   Enterococcus faecalis - MIC*    AMPICILLIN <=2 SENSITIVE Sensitive     VANCOMYCIN  1 SENSITIVE Sensitive     GENTAMICIN  SYNERGY SENSITIVE Sensitive     * FEW ENTEROCOCCUS FAECALIS    Labs: Results for orders placed or performed during the hospital encounter of 04/10/24 (from the past 48 hours)  Glucose, capillary     Status: Abnormal   Collection Time: 05/18/24  7:52 AM  Result Value Ref Range   Glucose-Capillary 105 (H) 70 - 99 mg/dL    Comment: Glucose reference range applies only to samples taken after fasting for at least 8 hours.  Glucose, capillary     Status: Abnormal   Collection Time: 05/18/24 12:09 PM  Result Value Ref Range   Glucose-Capillary  150 (H) 70 - 99 mg/dL    Comment: Glucose  reference range applies only to samples taken after fasting for at least 8 hours.  Glucose, capillary     Status: Abnormal   Collection Time: 05/18/24  4:18 PM  Result Value Ref Range   Glucose-Capillary 131 (H) 70 - 99 mg/dL    Comment: Glucose reference range applies only to samples taken after fasting for at least 8 hours.  Glucose, capillary     Status: Abnormal   Collection Time: 05/18/24  8:24 PM  Result Value Ref Range   Glucose-Capillary 158 (H) 70 - 99 mg/dL    Comment: Glucose reference range applies only to samples taken after fasting for at least 8 hours.  Glucose, capillary     Status: Abnormal   Collection Time: 05/18/24 11:29 PM  Result Value Ref Range   Glucose-Capillary 152 (H) 70 - 99 mg/dL    Comment: Glucose reference range applies only to samples taken after fasting for at least 8 hours.  Glucose, capillary     Status: None   Collection Time: 05/19/24  3:34 AM  Result Value Ref Range   Glucose-Capillary 84 70 - 99 mg/dL    Comment: Glucose reference range applies only to samples taken after fasting for at least 8 hours.  Renal function panel     Status: Abnormal   Collection Time: 05/19/24  6:00 AM  Result Value Ref Range   Sodium 129 (L) 135 - 145 mmol/L   Potassium 3.6 3.5 - 5.1 mmol/L   Chloride 102 98 - 111 mmol/L   CO2 17 (L) 22 - 32 mmol/L   Glucose, Bld 95 70 - 99 mg/dL    Comment: Glucose reference range applies only to samples taken after fasting for at least 8 hours.   BUN 62 (H) 8 - 23 mg/dL   Creatinine, Ser 8.25 (H) 0.61 - 1.24 mg/dL   Calcium  10.0 8.9 - 10.3 mg/dL   Phosphorus 4.6 2.5 - 4.6 mg/dL   Albumin  3.1 (L) 3.5 - 5.0 g/dL   GFR, Estimated 41 (L) >60 mL/min    Comment: (NOTE) Calculated using the CKD-EPI Creatinine Equation (2021)    Anion gap 10 5 - 15    Comment: Performed at [facility], [address] [place], [place] [address]  Glucose, capillary     Status: Abnormal   Collection Time: 05/19/24  7:46 AM  Result Value Ref  Range   Glucose-Capillary 112 (H) 70 - 99 mg/dL    Comment: Glucose reference range applies only to samples taken after fasting for at least 8 hours.  Glucose, capillary     Status: Abnormal   Collection Time: 05/19/24 12:45 PM  Result Value Ref Range   Glucose-Capillary 134 (H) 70 - 99 mg/dL    Comment: Glucose reference range applies only to samples taken after fasting for at least 8 hours.  Glucose, capillary     Status: Abnormal   Collection Time: 05/19/24  3:50 PM  Result Value Ref Range   Glucose-Capillary 212 (H) 70 - 99 mg/dL    Comment: Glucose reference range applies only to samples taken after fasting for at least 8 hours.  Glucose, capillary     Status: Abnormal   Collection Time: 05/19/24  7:41 PM  Result Value Ref Range   Glucose-Capillary 102 (H) 70 - 99 mg/dL    Comment: Glucose reference range applies only to samples taken after fasting for at least 8 hours.  Glucose, capillary     Status: Abnormal   Collection Time: 05/19/24 11:58 PM  Result Value Ref Range   Glucose-Capillary 104 (H) 70 - 99 mg/dL    Comment: Glucose reference range applies only to samples taken after fasting for at least 8 hours.  Glucose, capillary     Status: Abnormal   Collection Time: 05/20/24  3:30 AM  Result Value Ref Range   Glucose-Capillary 126 (H) 70 - 99 mg/dL    Comment: Glucose reference range applies only to samples taken after fasting for at least 8 hours.  Renal function panel     Status: Abnormal   Collection Time: 05/20/24  3:54 AM  Result Value Ref Range   Sodium 127 (L) 135 - 145 mmol/L   Potassium 3.8 3.5 - 5.1 mmol/L   Chloride 99 98 - 111 mmol/L   CO2 17 (L) 22 - 32 mmol/L   Glucose, Bld 104 (H) 70 - 99 mg/dL    Comment: Glucose reference range applies only to samples taken after fasting for at least 8 hours.   BUN 69 (H) 8 - 23 mg/dL   Creatinine, Ser 8.05 (H) 0.61 - 1.24 mg/dL   Calcium  10.0 8.9 - 10.3 mg/dL   Phosphorus 4.8 (H) 2.5 - 4.6 mg/dL   Albumin  3.2 (L)  3.5 - 5.0 g/dL   GFR, Estimated 36 (L) >60 mL/min    Comment: (NOTE) Calculated using the CKD-EPI Creatinine Equation (2021)    Anion gap 11 5 - 15    Comment: Performed at [facility], [address], [place], [place] [address]    Imaging / Studies: No results found.       Medications / Allergies: per chart  Antibiotics: Anti-infectives (From admission, onward)    Start     Dose/Rate Route Frequency Ordered Stop   05/06/24 1400  piperacillin -tazobactam (ZOSYN ) IVPB 3.375 g        3.375 g 12.5 mL/hr over 240 Minutes Intravenous Every 8 hours 05/06/24 1227 05/19/24 0900   05/01/24 1400  piperacillin -tazobactam (ZOSYN ) IVPB 2.25 g  Status:  Discontinued        2.25 g 100 mL/hr over 30 Minutes Intravenous Every 8 hours 05/01/24 0957 05/06/24 1227   05/01/24 1045  micafungin  (MYCAMINE ) 100 mg in sodium chloride  0.9 % 100 mL IVPB        100 mg 105 mL/hr over 1 Hours Intravenous Every 24 hours 05/01/24 0949 05/18/24 1651   04/29/24 1000  micafungin  (MYCAMINE ) 150 mg in sodium chloride  0.9 % 100 mL IVPB  Status:  Discontinued        150 mg 107.5 mL/hr over 1 Hours Intravenous Every 24 hours 04/28/24 1036 05/01/24 0949   04/28/24 1345  micafungin  (MYCAMINE ) 100 mg in sodium chloride  0.9 % 100 mL IVPB        100 mg 105 mL/hr over 1 Hours Intravenous  Once 04/28/24 1245 04/28/24 1354   04/28/24 1000  micafungin  (MYCAMINE ) 100 mg in sodium chloride  0.9 % 100 mL IVPB  Status:  Discontinued        100 mg 105 mL/hr over 1 Hours Intravenous Every 24 hours 04/27/24 1219 04/28/24 1036   04/21/24 1400  clindamycin  (CLEOCIN ) 900 mg, gentamicin  (GARAMYCIN ) 240 mg in sodium chloride  0.9 % 1,000 mL for intraperitoneal lavage  Status:  Discontinued         Irrigation To Surgery 04/21/24 1346 04/21/24 1618   04/20/24 1400  piperacillin -tazobactam (ZOSYN ) IVPB 3.375 g  Status:  Discontinued        3.375 g 12.5 mL/hr over 240 Minutes Intravenous Every 8 hours 04/20/24 0755 05/01/24 0957    04/20/24 1000  metroNIDAZOLE  (FLAGYL ) IVPB 500 mg  Status:  Discontinued        500 mg 100 mL/hr over 60 Minutes Intravenous Every 12 hours 04/20/24 0320 04/20/24 0752   04/20/24 1000  micafungin  (MYCAMINE ) 150 mg in sodium chloride  0.9 % 100 mL IVPB  Status:  Discontinued        150 mg 107.5 mL/hr over 1 Hours Intravenous Every 24 hours 04/20/24 0752 04/27/24 1219   04/20/24 0245  ceFEPIme  (MAXIPIME ) 2 g in sodium chloride  0.9 % 100 mL IVPB  Status:  Discontinued        2 g 200 mL/hr over 30 Minutes Intravenous Every 8 hours 04/20/24 0232 04/20/24 0752   04/20/24 0100  metroNIDAZOLE  (FLAGYL ) IVPB 500 mg        500 mg 100 mL/hr over 60 Minutes Intravenous On call to O.R. 04/20/24 0004 04/20/24 0100   04/14/24 1800  ceFEPIme  (MAXIPIME ) 2 g in sodium chloride  0.9 % 100 mL IVPB        2 g 200 mL/hr over 30 Minutes Intravenous Every 8 hours 04/14/24 1003 04/19/24 0957   04/14/24 1600  vancomycin  (VANCOCIN ) IVPB 1000 mg/200 mL premix  Status:  Discontinued        1,000 mg 200 mL/hr over 60 Minutes Intravenous Every 24 hours 04/13/24 1420 04/14/24 1000   04/14/24 1600  vancomycin  (VANCOREADY) IVPB 1500 mg/300 mL  Status:  Discontinued        1,500 mg 150 mL/hr over 120 Minutes Intravenous Every 24 hours 04/14/24 1002 04/16/24 0744   04/14/24 1200  vancomycin  (VANCOCIN ) IVPB 1000 mg/200 mL premix  Status:  Discontinued        1,000 mg 200 mL/hr over 60 Minutes Intravenous Every 24 hours 04/13/24 1053 04/13/24 1109   04/13/24 2200  ceFEPIme  (MAXIPIME ) 2 g in sodium chloride  0.9 % 100 mL IVPB  Status:  Discontinued        2 g 200 mL/hr over 30 Minutes Intravenous Every 12 hours 04/13/24 1036 04/13/24 1109   04/13/24 2200  ceFEPIme  (MAXIPIME ) 2 g in sodium chloride  0.9 % 100 mL IVPB  Status:  Discontinued        2 g 200 mL/hr over 30 Minutes Intravenous Every 12 hours 04/13/24 1425 04/14/24 1003   04/13/24 1500  vancomycin  (VANCOCIN ) 2,000 mg in sodium chloride  0.9 % 500 mL IVPB        2,000  mg 260 mL/hr over 120 Minutes Intravenous  Once 04/13/24 1409 04/13/24 1850   04/13/24 1500  ceFEPIme  (MAXIPIME ) 2 g in sodium chloride  0.9 % 100 mL IVPB  Status:  Discontinued        2 g 200 mL/hr over 30 Minutes Intravenous Every 12 hours 04/13/24 1420 04/13/24 1425   04/13/24 1500  metroNIDAZOLE  (FLAGYL ) IVPB 500 mg  Status:  Discontinued        500 mg 100 mL/hr over 60 Minutes Intravenous Every 12 hours 04/13/24 1420 04/17/24 0725   04/13/24 1200  vancomycin  (VANCOCIN ) 2,000 mg in sodium chloride  0.9 % 500 mL IVPB  Status:  Discontinued        2,000 mg 260 mL/hr over 120 Minutes Intravenous  Once 04/13/24 1052 04/13/24 1121   04/13/24 1100  metroNIDAZOLE  (FLAGYL ) IVPB 500 mg  Status:  Discontinued        500 mg  100 mL/hr over 60 Minutes Intravenous 2 times daily 04/13/24 1007 04/13/24 1109   04/13/24 1100  vancomycin  (VANCOCIN ) IVPB 1000 mg/200 mL premix  Status:  Discontinued        1,000 mg 200 mL/hr over 60 Minutes Intravenous  Once 04/13/24 1007 04/13/24 1020   04/13/24 1015  ceFEPIme  (MAXIPIME ) 2 g in sodium chloride  0.9 % 100 mL IVPB        2 g 200 mL/hr over 30 Minutes Intravenous STAT 04/13/24 1007 04/14/24 1721   04/10/24 2200  ceFAZolin  (ANCEF ) IVPB 2g/100 mL premix        2 g 200 mL/hr over 30 Minutes Intravenous Every 8 hours 04/10/24 1833 04/11/24 0531   04/10/24 0600  ceFAZolin  (ANCEF ) IVPB 2g/100 mL premix        2 g 200 mL/hr over 30 Minutes Intravenous On call to O.R. 04/10/24 0533 04/10/24 1526         Note: Portions of this report may have been transcribed using voice recognition software. Every effort was made to ensure accuracy; however, inadvertent computerized transcription errors may be present.   Any transcriptional errors that result from this process are unintentional.    Elspeth KYM Schultze, MD, FACS, MASCRS Esophageal, Gastrointestinal & Colorectal Surgery Robotic and Minimally Invasive Surgery  [facility] [address] [place], [place] [address] [phone] Fax [phone] Main  CONTACT INFORMATION: Weekday (9AM-5PM): Call [facility] main office at [phone] Weeknight (5PM-9AM) or Weekend/Holiday: Check EPIC Web Links tab & use AMION (password  [email or web address]) for General Surgery [facility] coverage  Please, DO NOT use SecureChat  (it is not reliable communication to reach operating surgeons & will lead to a delay in care).   Epic staff messaging available for outptient concerns needing 1-2 business day response.      05/20/2024  7:06 AM

## 2024-05-20 NOTE — Progress Notes (Signed)
 Nutrition Brief Note  Pt interested in continuing supplements outside of hospital. RD provided Juven packet, Ensure coupons, and High calorie, high protein handouts at bedside. Answered any questions pt and family had.   Olivia Kenning, RD Registered Dietitian  See Amion for more information

## 2024-05-20 NOTE — Progress Notes (Signed)
 PROGRESS NOTE    Kristopher Thompson  FMW:[id number] DOB: 1951-09-26 DOA: 04/10/2024 PCP: [name], MD   Brief Narrative:  Kristopher Thompson is a 73 y.o. male with past medical history significant for HTN, chronic diastolic congestive heart failure, CAD, DM2, pulmonary embolism on Eliquis , anemia, obesity, prostate cancer, bladder cancer who was initially admitted by the general surgery service on 5/30 for operative management regarding parastomal and incisional incarcerated abdominal wall hernias; requiring extensive lysis, mesh insertion, repair, urostomy ileal conduit revision thereafter admitted to the ICU due to tachycardia and tachypnea. Initially placed on norepinephrine  and an arterial line was placed. Patient declined NG tube placement but eventually agreed. Patient was taken back to the OR on 6/9 due to perforated bowel and postop shock. Required multiple pressors, steroids and PRBC transfusion after washout/closure in the OR. On 6/11 developed AKI requiring dialysis.. Patient was extubated on 6/14.    Significant hospital events/procedures: 5/30: Admit to general surgery, robotic LOA, incisional/parastomal, left inguinal incarcerated hernia repair with mesh, urostomy ileal conduit revision, Dr. Sheldon 6/2: CCM consult and vasopressor 6/4: off NE. Pulled out his NGT refused replacement. After multiple emesis  6/5: NG tube replaced. 6/6: transferred to MedSurg. 6/9: OR for perforated bowel, drainage from right abdominal wall abscess, intra-abdominal abscess, small bowel resection, wound VAC; Dr. Tanda 6/10: NE, Phenylephrine , vasopressin , steroids, 1u PRBCs, back to OR for LOA, abdominal wall debridement, ileal resection, end ileostomy abdominal wall hernia repairs with mesh, fascial closure, wound VAC, Dr. Sheldon 6/11: Worsening AKI with fluid overload, start CRRT, decreasing pressor requirements 6/14: Off CRRT, arousable.  Extubated. 6/15: transferred to [facility] for iHD; montoring for  renal recovery 6/16: iHD, severely encephalopathic with high BUN 6/17: restarted CRRT for to ongoing uremia and encephalopathy 6/18: was awake enough to work with PT, OT 6/23: bilateral PCN placed by interventional radiology, Dr. Jennefer 6/24: MS imrpoving, Neph tubes in and draining 6/30: bilateral nephrostomy tube exchange by IR 7/2: Right percutaneous nephrostomy exchange with ureteral stent by IR, cortrak removed 7/7: Oral intake increased, completing micafungin /Zosyn  today   Assessment & Plan:  Principal Problem:   Delayed bowel perforation s/p SBR/end ileostomy Active Problems:   S/P ileal conduit (HCC)   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 08/08/2017   GERD (gastroesophageal reflux disease)   Obesity (BMI 35.0-39.9 without comorbidity)   Prolonged QT interval   Non-recurrent bilateral inguinal hernia without obstruction or gangrene   Incarcerated incisional hernia   Sinus tachycardia   Tachypnea   Acute respiratory insufficiency, postoperative   Sepsis due to undetermined organism (HCC)   Lactic acidosis   Class 2 obesity   Chronic anticoagulation   Hearing loss   History of bladder cancer   Obstructive sleep apnea of adult   Parastomal hernia of ileal conduit   Partial small bowel obstruction (HCC)   Personal history of PE (pulmonary embolism)   Pressure injury of skin   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 Sheldon on 04/10/2024.  Postoperatively patient required vasopressors and was transferred to the ICU.  Subsequent return to  the OR on 04/20/2024 for bowel perforation underwent I&D of abdominal  wall/intra-abdominal abscess with small bowel resection.  Returned to OR on 6/10 for further abdominal wall debridement, LOA, ileal resection, end ileostomy with abdominal wall hernia repairs with mesh, fascial closure with wound VAC.  Seen by infectious disease, Dr. Dea; with recommendation of 4-6-week course of Zosyn  and micafungin  treatment given mesh placed in a contaminated field with completion of treatment on 05/18/2024. -Surgery following, appreciate assistance -Wound VAC changes twice weekly Monday/Thursday -Further per General Surgery diet was changed to regular diet.  Given that he continues have ileostomy output bowel regimen has been initiated and has been given soluble Polly fiber/FiberCon to thicken and also the general surgery team and added loperamide  to help constipate the patient   Acute renal failure secondary to ischemic ATN/shock Bilateral hydronephrosis/obstruction -Urology following, appreciate assistance -Cr (0.97 04/01/2024) -BUN/Cr Trend: Recent Labs  Lab 05/15/24 0610 05/16/24 0326 05/17/24 0552 05/18/24 0207 05/18/24 0600 05/19/24 0600 05/20/24 0354  BUN 77* 78* 69* 57* 73* 62* 69*  CREATININE 1.89* 2.11* 1.92* 1.72* 2.08* 1.74* 1.94*  -Avoid Nephrotoxic Medications, Contrast Dyes, Hypotension and Dehydration to Ensure Adequate Renal Perfusion and will need to Renally Adjust Meds -Continue to Monitor and Trend Renal Function carefully and repeat CMP in the AM  -Required CRRT followed by HD while inpatient, last HD 6/23; nephrology now signed off -S/p bilateral PCN on 6/23, exchanged 6/30; right exchanged 7/2  [name]: Supportive Care. C/w Metaclopramide 5 mg TID, Ondansetron  4 mg q6hprn Nausea and Vomiting, and Prochlorperazine  10 mg IV q6hprn Refractory N/V   Urine leak w/ Hx prior radical cystectomy/ileal conduit urinary diversion: CT loopogram 7/1 with finding of urine leak  identified at inferior butt end of ileal conduit. Right percutaneous nephrostomy exchange with ureteral stent 7/2 by IR.  Further per urology and interventional radiology   Type 2 Diabetes Mellitus: On metformin 500 mg p.o. daily outpatient.  Hemoglobin A1c 5.3 on 04/01/2024. C/w Semglee  25u [place] at bedtime and  Resistant SSI for coverage   Acute blood loss anemia Right flank hematoma Anemia of chronic medical disease Anemia panel with iron  54, TIBC 281, ferritin 588 -Hgb/Hct Trend:  Recent Labs  Lab 05/12/24 0412 05/13/24 0500 05/14/24 0315 05/15/24 0610 05/16/24 0326 05/18/24 0600 05/20/24 0845  HGB 11.4* 12.1* 11.7* 11.7* 12.9* 13.1 13.7  HCT 36.2* 37.4* 37.1* 36.8* 40.3 39.0 40.1  MCV 93.5 92.6 93.9 92.5 93.1 90.9 89.3  -CTM for S/Sx of Bleeding/   Chronic diastolic congestive heart failure Essential HTN -C/w Hydralazine  20 mg IV every 4 hours as needed SBP greater than 160   HLD: C/w Rosuvastatin  10 mg p.o. nightly   History of pulmonary embolism: C/w Eliquis  5 mg p.o. twice daily    Weakness/ability/deconditioning:  PT/OT initially recommended CIR but now recommending SNF. Continue therapy efforts while inpatient  Hypoalbuminemia: Patient's Albumin  Level ranging from 3.0-3.3. CTM and Trend and repeat CMP in the AM  Moderate Protein Calorie Malnutrition: Nutrition Status:  Nutrition Problem: Increased nutrient needs Etiology: acute illness Signs/Symptoms: estimated needs Interventions: Tube feeding, Prostat, Juven  Class I Obesity: Complicates overall prognosis and care. Estimated body mass index is 30.53 kg/m as calculated from the following:   Height as of this encounter: 5' 11 (1.803 m).   Weight as of this encounter: 99.3 kg. Weight Loss and Dietary Counseling given   DVT prophylaxis: Place TED hose Start: 04/28/24 0818 SCD's Start: 04/10/24 1834 apixaban  (ELIQUIS ) tablet 5 mg    Code Status: Full Code Family Communication: D/w wife @ bedside  Disposition  Plan:  Level of care: Med-Surg Status is: Inpatient Remains inpatient appropriate because: Needs further clinical improvement and clearance by the specialist   Consultants:  General Surgery Urology Interventional Radiology Infectious Diseases Nephrology PCCM  Procedures:  As delineated as above  Antimicrobials:  Anti-infectives (From admission, onward)    Start     Dose/Rate Route Frequency Ordered Stop   05/06/24 1400  piperacillin -tazobactam (ZOSYN ) IVPB 3.375 g        3.375 g 12.5 mL/hr over 240 Minutes Intravenous Every 8 hours 05/06/24 1227 05/19/24 0900   05/01/24 1400  piperacillin -tazobactam (ZOSYN ) IVPB 2.25 g  Status:  Discontinued        2.25 g 100 mL/hr over 30 Minutes Intravenous Every 8 hours 05/01/24 0957 05/06/24 1227   05/01/24 1045  micafungin  (MYCAMINE ) 100 mg in sodium chloride  0.9 % 100 mL IVPB        100 mg 105 mL/hr over 1 Hours Intravenous Every 24 hours 05/01/24 0949 05/18/24 1651   04/29/24 1000  micafungin  (MYCAMINE ) 150 mg in sodium chloride  0.9 % 100 mL IVPB  Status:  Discontinued        150 mg 107.5 mL/hr over 1 Hours Intravenous Every 24 hours 04/28/24 1036 05/01/24 0949   04/28/24 1345  micafungin  (MYCAMINE ) 100 mg in sodium chloride  0.9 % 100 mL IVPB        100 mg 105 mL/hr over 1 Hours Intravenous  Once 04/28/24 1245 04/28/24 1354   04/28/24 1000  micafungin  (MYCAMINE ) 100 mg in sodium chloride  0.9 % 100 mL IVPB  Status:  Discontinued        100 mg 105 mL/hr over 1 Hours Intravenous Every 24 hours 04/27/24 1219 04/28/24 1036   04/21/24 1400  clindamycin  (CLEOCIN ) 900 mg, gentamicin  (GARAMYCIN ) 240 mg in sodium chloride  0.9 % 1,000 mL for intraperitoneal lavage  Status:  Discontinued         Irrigation To Surgery 04/21/24 1346 04/21/24 1618   04/20/24 1400  piperacillin -tazobactam (ZOSYN ) IVPB 3.375 g  Status:  Discontinued        3.375 g 12.5 mL/hr over 240 Minutes Intravenous Every 8 hours 04/20/24 0755 05/01/24 0957   04/20/24 1000   metroNIDAZOLE  (FLAGYL ) IVPB 500 mg  Status:  Discontinued        500 mg 100 mL/hr over 60 Minutes Intravenous Every 12 hours 04/20/24 0320 04/20/24 0752   04/20/24 1000  micafungin  (MYCAMINE ) 150 mg in sodium chloride  0.9 % 100 mL IVPB  Status:  Discontinued        150 mg 107.5 mL/hr over 1 Hours Intravenous Every 24 hours 04/20/24 0752 04/27/24 1219   04/20/24 0245  ceFEPIme  (MAXIPIME ) 2 g in sodium chloride  0.9 % 100 mL IVPB  Status:  Discontinued        2 g 200 mL/hr over 30 Minutes Intravenous Every 8 hours 04/20/24 0232 04/20/24 0752   04/20/24 0100  metroNIDAZOLE  (FLAGYL ) IVPB 500 mg        500 mg 100 mL/hr over 60 Minutes Intravenous On call to O.R. 04/20/24 0004 04/20/24 0100   04/14/24 1800  ceFEPIme  (MAXIPIME ) 2 g in sodium chloride  0.9 % 100 mL IVPB        2 g 200 mL/hr over 30 Minutes Intravenous Every 8 hours 04/14/24 1003 04/19/24 0957   04/14/24 1600  vancomycin  (VANCOCIN ) IVPB 1000 mg/200 mL premix  Status:  Discontinued        1,000 mg 200 mL/hr over 60 Minutes Intravenous Every  24 hours 04/13/24 1420 04/14/24 1000   04/14/24 1600  vancomycin  (VANCOREADY) IVPB 1500 mg/300 mL  Status:  Discontinued        1,500 mg 150 mL/hr over 120 Minutes Intravenous Every 24 hours 04/14/24 1002 04/16/24 0744   04/14/24 1200  vancomycin  (VANCOCIN ) IVPB 1000 mg/200 mL premix  Status:  Discontinued        1,000 mg 200 mL/hr over 60 Minutes Intravenous Every 24 hours 04/13/24 1053 04/13/24 1109   04/13/24 2200  ceFEPIme  (MAXIPIME ) 2 g in sodium chloride  0.9 % 100 mL IVPB  Status:  Discontinued        2 g 200 mL/hr over 30 Minutes Intravenous Every 12 hours 04/13/24 1036 04/13/24 1109   04/13/24 2200  ceFEPIme  (MAXIPIME ) 2 g in sodium chloride  0.9 % 100 mL IVPB  Status:  Discontinued        2 g 200 mL/hr over 30 Minutes Intravenous Every 12 hours 04/13/24 1425 04/14/24 1003   04/13/24 1500  vancomycin  (VANCOCIN ) 2,000 mg in sodium chloride  0.9 % 500 mL IVPB        2,000 mg 260 mL/hr over  120 Minutes Intravenous  Once 04/13/24 1409 04/13/24 1850   04/13/24 1500  ceFEPIme  (MAXIPIME ) 2 g in sodium chloride  0.9 % 100 mL IVPB  Status:  Discontinued        2 g 200 mL/hr over 30 Minutes Intravenous Every 12 hours 04/13/24 1420 04/13/24 1425   04/13/24 1500  metroNIDAZOLE  (FLAGYL ) IVPB 500 mg  Status:  Discontinued        500 mg 100 mL/hr over 60 Minutes Intravenous Every 12 hours 04/13/24 1420 04/17/24 0725   04/13/24 1200  vancomycin  (VANCOCIN ) 2,000 mg in sodium chloride  0.9 % 500 mL IVPB  Status:  Discontinued        2,000 mg 260 mL/hr over 120 Minutes Intravenous  Once 04/13/24 1052 04/13/24 1121   04/13/24 1100  metroNIDAZOLE  (FLAGYL ) IVPB 500 mg  Status:  Discontinued        500 mg 100 mL/hr over 60 Minutes Intravenous 2 times daily 04/13/24 1007 04/13/24 1109   04/13/24 1100  vancomycin  (VANCOCIN ) IVPB 1000 mg/200 mL premix  Status:  Discontinued        1,000 mg 200 mL/hr over 60 Minutes Intravenous  Once 04/13/24 1007 04/13/24 1020   04/13/24 1015  ceFEPIme  (MAXIPIME ) 2 g in sodium chloride  0.9 % 100 mL IVPB        2 g 200 mL/hr over 30 Minutes Intravenous STAT 04/13/24 1007 04/14/24 1721   04/10/24 2200  ceFAZolin  (ANCEF ) IVPB 2g/100 mL premix        2 g 200 mL/hr over 30 Minutes Intravenous Every 8 hours 04/10/24 1833 04/11/24 0531   04/10/24 0600  ceFAZolin  (ANCEF ) IVPB 2g/100 mL premix        2 g 200 mL/hr over 30 Minutes Intravenous On call to O.R. 04/10/24 0533 04/10/24 1526       Subjective: Seen and examined at bedside and he is complaining of some nausea and felt uncomfortable.  Denied chest pain or lightheadedness or dizziness.  No other concerns or complaints this time.  Objective: Vitals:   05/20/24 0751 05/20/24 1143 05/20/24 1645 05/20/24 2014  BP: 108/73 111/74 101/66 104/77  Pulse:   (!) 108 (!) 102  Resp: 16 16 18 18   Temp: 97.7 F (36.5 C) 97.8 F (36.6 C) 98 F (36.7 C) 97.6 F (36.4 C)  TempSrc: Oral Oral Oral Oral  SpO2:   98% 93%   Weight:      Height:        Intake/Output Summary (Last 24 hours) at 05/20/2024 2127 Last data filed at 05/20/2024 1929 Gross per 24 hour  Intake 1095 ml  Output 2170 ml  Net -1075 ml   Filed Weights   05/04/24 2200 05/06/24 0500 05/07/24 0500  Weight: 44.9 kg 100.4 kg 99.3 kg   Examination: Physical Exam:  Constitutional: Chronically ill-appearing Caucasian male appears uncomfortable Respiratory: Diminished to auscultation bilaterally, no wheezing, rales, rhonchi or crackles. Normal respiratory effort and patient is not tachypenic. No accessory muscle use.  Unlabored breathing Cardiovascular: RRR, no murmurs / rubs / gallops. S1 and S2 auscultated. No extremity edema. Abdomen: Soft, non-tender, distended secondary to body. Bowel sounds positive.  GU: Deferred. Musculoskeletal: No clubbing / cyanosis of digits/nails. No joint deformity upper and lower extremities.  Skin: No rashes, lesions, ulcers on limited skin evaluation. No induration; Warm and dry.  Neurologic: CN 2-12 grossly intact with no focal deficits. Romberg sign and cerebellar reflexes not assessed.  Psychiatric: Normal judgment and insight.  Anxious  Data Reviewed: I have personally reviewed following labs and imaging studies  CBC: Recent Labs  Lab 05/14/24 0315 05/15/24 0610 05/16/24 0326 05/18/24 0600 05/20/24 0845  WBC 11.0* 8.8 8.8 10.4 10.3  NEUTROABS  --   --   --   --  6.6  HGB 11.7* 11.7* 12.9* 13.1 13.7  HCT 37.1* 36.8* 40.3 39.0 40.1  MCV 93.9 92.5 93.1 90.9 89.3  PLT 196 221 232 313 316   Basic Metabolic Panel: Recent Labs  Lab 05/16/24 0326 05/17/24 0552 05/18/24 0207 05/18/24 0600 05/19/24 0600 05/20/24 0354  NA 131* 128* 130* 128* 129* 127*  K 4.0 3.6 3.7 4.0 3.6 3.8  CL 101 97* 101 98 102 99  CO2 17* 17* 17* 17* 17* 17*  GLUCOSE 114* 105* 103* 136* 95 104*  BUN 78* 69* 57* 73* 62* 69*  CREATININE 2.11* 1.92* 1.72* 2.08* 1.74* 1.94*  CALCIUM  10.2 9.8 9.7 9.7 10.0 10.0  PHOS 5.1*  4.8* 4.8*  --  4.6 4.8*   GFR: Estimated Creatinine Clearance: 41.9 mL/min (A) (by C-G formula based on SCr of 1.94 mg/dL (H)). Liver Function Tests: Recent Labs  Lab 05/17/24 0552 05/18/24 0207 05/18/24 0600 05/19/24 0600 05/20/24 0354  AST  --   --  46*  --   --   ALT  --   --  74*  --   --   ALKPHOS  --   --  85  --   --   BILITOT  --   --  0.5  --   --   PROT  --   --  7.4  --   --   ALBUMIN  3.1* 3.1* 3.1* 3.1* 3.2*   No results for input(s): LIPASE, AMYLASE in the last 168 hours. No results for input(s): AMMONIA in the last 168 hours. Coagulation Profile: No results for input(s): INR, PROTIME in the last 168 hours. Cardiac Enzymes: No results for input(s): CKTOTAL, CKMB, CKMBINDEX, TROPONINI in the last 168 hours. BNP (last 3 results) No results for input(s): PROBNP in the last 8760 hours. HbA1C: No results for input(s): HGBA1C in the last 72 hours. CBG: Recent Labs  Lab 05/20/24 0330 05/20/24 0751 05/20/24 1142 05/20/24 1644 05/20/24 2016  GLUCAP 126* 111* 128* 172* 137*   Lipid Profile: No results for input(s): CHOL, HDL, LDLCALC, TRIG, CHOLHDL, LDLDIRECT in the last 72  hours. Thyroid  Function Tests: No results for input(s): TSH, T4TOTAL, FREET4, T3FREE, THYROIDAB in the last 72 hours. Anemia Panel: No results for input(s): VITAMINB12, FOLATE, FERRITIN, TIBC, IRON , RETICCTPCT in the last 72 hours. Sepsis Labs: No results for input(s): PROCALCITON, LATICACIDVEN in the last 168 hours.  No results found for this or any previous visit (from the past 240 hours).   Radiology Studies: No results found.  Scheduled Meds:  (feeding supplement) PROSource Plus  30 mL Oral BID BM   apixaban   5 mg Oral BID   Chlorhexidine  Gluconate Cloth  6 each Topical Daily   vitamin B-12  1,000 mcg Oral Daily   feeding supplement  237 mL Oral BID BM   insulin  aspart  0-20 Units Subcutaneous Q4H   insulin  glargine-yfgn   25 Units Subcutaneous QHS   loperamide   2 mg Oral BID   melatonin  5 mg Oral QHS   metoCLOPramide   5 mg Oral TID AC   multivitamin with minerals  1 tablet Oral Daily   nutrition supplement (JUVEN)  1 packet Oral BID BM   mouth rinse  15 mL Mouth Rinse 4 times per day   polycarbophil  625 mg Oral BID   rosuvastatin   10 mg Oral QPM   sodium chloride  flush  5 mL Intracatheter Q8H   Continuous Infusions:  sodium chloride  Stopped (05/01/24 0014)   albumin  human     anticoagulant sodium citrate       LOS: 40 days   Alejandro Marker, DO [facility] Available via Epic secure chat 7am-7pm After these hours, please refer to coverage provider listed on [email or web address] 05/20/2024, 9:27 PM

## 2024-05-20 NOTE — Progress Notes (Signed)
 Physical Therapy Treatment Patient Details Name: Kristopher Thompson MRN: [id number] DOB: 09-11-1951 Today's Date: 05/20/2024   History of Present Illness 73 y/o M admitted to [facility] 5/30 for hernia repair and urostomy revision. 6/9 to OR for perforated bowel, post op shock, s/p exploratory lapartomy with drainage of Rt abdominal wall abscess. CRRT 6/11-6/14. Extubated 6/14, Transferred to [facility] 6/15 for intermittent HD. CRRT again 6/17-6/20. 6/23 bil perc nephrostomy tubes placed. PMHx: bladder and prostate CA, RBBB, CHF, colostomy, DM, GERD, abdominal surgery.    PT Comments  The pt was a bit irritated by attempts to assess his dizziness and progress his mobility this date, needing encouragement to eventually participate and to agree upon a goal for a distance to ambulate this date. Pt ultimately agreeable to attempting to ambulate to bathroom door and back if provided a close chair follow for safety. He does display deficits in lower extremity strength, endurance, and balance that impact his safety and result in him needing minA to ambulate. However, the pt can also be self-limiting, potentially impacted by anxiety in regards to standing mobility, mentioning a fear of falling multiple times. When assessing his dizziness, it appeared to be reproduced most with supine to sit and sit to stand positional changes rather than with vestibular testing. No nystagmus was noted either. Thus, I suspect it is likely related to his vitals with positional changes rather than a vestibular dysfunction, but he could benefit from further assessment. Will continue to follow acutely.    Vestibular Assessment - 05/20/24 0001       Vestibular Assessment   General Observation The pt did not demonstrate any nystagmus or strong reproduction of his dizziness with vestibular testing. He experienced his symptoms most when first sitting up from being supine and when standing up from sitting, suggesting there may be a BP component to  his dizziness. However, the pt was a bit irritated with questions and trying to progress his mobility today and reported his BP has already been monitored and reported normal before, so did not obtain orthostatics this date. He did have some very mild dizziness with L Trenda Craze testing along with R eyelid L twitching (inconsistent in rhythm and occurence though) with bil Trenda Craze testing, which could have just been eye twitching and not subtle nystagmus. Will plan to continue to monitor symptoms.      Symptom Behavior   Subjective history of current problem Pt reports a spinning dizziness that primarily occurs when he stands up and when he leans his head back. He reports it has been occuring for years but has worsened while hospitalized.    Type of Dizziness  Spinning    Frequency of Dizziness intermittent, depends on position    Duration of Dizziness 10 seconds    Symptom Nature Motion provoked;Positional    Aggravating Factors Looking up to the ceiling;Sit to stand    Relieving Factors Lying supine;Head stationary;Rest    Progression of Symptoms Worse    History of similar episodes has been occuring for years per pt      Oculomotor Exam   Oculomotor Alignment Normal    Ocular ROM WFL    Spontaneous Absent    Smooth Pursuits Intact    Saccades Undershoots    Comment denies dizziness      Oculomotor Exam-Fixation Suppressed    Ocular Alignment Normal    Ocular ROM WFL    Left Head Impulse negative    Right Head Impulse pt made correction, no nystagmus noted  though      Visual Acuity   Static wears glasses at all times      Positional Testing   Dix-Hallpike Dix-Hallpike Right;Dix-Hallpike Left    Horizontal Canal Testing Horizontal Canal Right;Horizontal Canal Left      Dix-Hallpike Right   Dix-Hallpike Right Duration no nystagmus but R eyelid did twitch to L intermittently, but not consistently, denied dizziness/symptoms    Dix-Hallpike Right Symptoms No nystagmus       Dix-Hallpike Left   Dix-Hallpike Left Duration no nystagmus but R eyelid did twitch to L intermittently, but not consistently, reported some mild dizziness but reports it was less intense than usual    Dix-Hallpike Left Symptoms No nystagmus      Horizontal Canal Right   Horizontal Canal Right Duration negative    Horizontal Canal Right Symptoms Normal      Horizontal Canal Left   Horizontal Canal Left Duration negative    Horizontal Canal Left Symptoms Normal      Orthostatics   Orthostatics Comment did not test as pt was a bit irritated and reported his BP has been tested and normal before             If plan is discharge home, recommend the following: Assistance with cooking/housework;Assist for transportation;Help with stairs or ramp for entrance;Supervision due to cognitive status;A lot of help with walking and/or transfers;A lot of help with bathing/dressing/bathroom   Can travel by private vehicle     No  Equipment Recommendations  Rolling walker (2 wheels);BSC/3in1;Wheelchair (measurements PT);Wheelchair cushion (measurements PT)    Recommendations for Other Services       Precautions / Restrictions Precautions Precautions: Fall Recall of Precautions/Restrictions: Impaired Precaution/Restrictions Comments: R drain (L drain d/c 7/7), ileostomy, urostomy, abd wound vac, bil perc nephrostomy drains Restrictions Weight Bearing Restrictions Per Provider Order: No     Mobility  Bed Mobility Overal bed mobility: Needs Assistance Bed Mobility: Supine to Sit, Sit to Supine     Supine to sit: Mod assist, HOB elevated Sit to supine: Mod assist, HOB elevated   General bed mobility comments: Cued pt to roll to side then sit up from sidelying to try to manage abdominal pain, but requesting HHA to pull up to sit from supine instead, needing modA. Pt requesting assistance to lift legs back to bed for return to supine, modA provided.    Transfers Overall transfer level:  Needs assistance Equipment used: Rolling walker (2 wheels) Transfers: Sit to/from Stand Sit to Stand: Min assist           General transfer comment: EOB slightly elevated, minA needed at hips to extend and power up to stand from EOB. Assistance needed for eccentric control with return to sit from standing.    Ambulation/Gait Ambulation/Gait assistance: Min assist, +2 safety/equipment Gait Distance (Feet): 7 Feet Assistive device: Rolling walker (2 wheels) Gait Pattern/deviations: Shuffle, Trunk flexed, Step-through pattern, Decreased stride length, Staggering right, Staggering left Gait velocity: decr Gait velocity interpretation: <1.31 ft/sec, indicative of household ambulator   General Gait Details: MinA needed for balance as pt staggered laterally at the half way point, but then his balance improved with return towards bed. +2 for chair follow safety. Cues provided for pt to try to extend his knees for improved stability during stance phase. Pt self limiting and declining further attempts or progressing distance further   Stairs             Wheelchair Mobility     Tilt Bed  Modified Rankin (Stroke Patients Only)       Balance Overall balance assessment: Needs assistance Sitting-balance support: No upper extremity supported, Feet supported Sitting balance-Leahy Scale: Fair     Standing balance support: Bilateral upper extremity supported, During functional activity, Reliant on assistive device for balance Standing balance-Leahy Scale: Poor Standing balance comment: reliant on RW and up to minA for balance                            Communication Communication Communication: No apparent difficulties Factors Affecting Communication: Hearing impaired  Cognition Arousal: Alert Behavior During Therapy: Flat affect (irritated)   PT - Cognitive impairments: Safety/Judgement, Initiation                       PT - Cognition Comments: can be  self-limiting, needs encouragement to progress, mildly irritated at therapist for trying to encourage activity progression and assessing his dizziness; educated pt on importance of frequent mobility and progressing mobility and that therapy is here to assist him towards his goals Following commands: Intact      Cueing Cueing Techniques: Verbal cues  Exercises      General Comments General comments (skin integrity, edema, etc.): educated pt on SLR and LAQs as able throughout day, but pt becoming irritated and not really receptive at this time      Pertinent Vitals/Pain Pain Assessment Pain Assessment: Faces Faces Pain Scale: Hurts little more Pain Location: abdomen, generalized Pain Descriptors / Indicators: Discomfort, Grimacing, Guarding Pain Intervention(s): Limited activity within patient's tolerance, Monitored during session, Repositioned    Home Living                          Prior Function            PT Goals (current goals can now be found in the care plan section) Acute Rehab PT Goals Patient Stated Goal: Regain independence PT Goal Formulation: With patient Time For Goal Achievement: 05/27/24 Potential to Achieve Goals: Good Progress towards PT goals: Progressing toward goals (slowly)    Frequency    Min 2X/week      PT Plan      Co-evaluation              AM-PAC PT 6 Clicks Mobility   Outcome Measure  Help needed turning from your back to your side while in a flat bed without using bedrails?: A Little Help needed moving from lying on your back to sitting on the side of a flat bed without using bedrails?: A Little Help needed moving to and from a bed to a chair (including a wheelchair)?: A Little Help needed standing up from a chair using your arms (e.g., wheelchair or bedside chair)?: A Little Help needed to walk in hospital room?: Total Help needed climbing 3-5 steps with a railing? : Total 6 Click Score: 14    End of Session  Equipment Utilized During Treatment: Gait belt Activity Tolerance: Patient limited by fatigue Patient left: in bed;with call bell/phone within reach;with bed alarm set Nurse Communication: Mobility status PT Visit Diagnosis: Unsteadiness on feet (R26.81);Other abnormalities of gait and mobility (R26.89);Muscle weakness (generalized) (M62.81);Difficulty in walking, not elsewhere classified (R26.2) Pain - Right/Left:  (abdomen) Pain - part of body:  (abdomen)     Time: 8499-8466 PT Time Calculation (min) (ACUTE ONLY): 33 min  Charges:    $Therapeutic Activity: 23-37 mins PT  General Charges $$ ACUTE PT VISIT: 1 Visit                     Theo Ferretti, PT, DPT Acute Rehabilitation Services  Office: [phone]    Theo CHRISTELLA Ferretti 05/20/2024, 6:22 PM

## 2024-05-21 DIAGNOSIS — E669 Obesity, unspecified: Secondary | ICD-10-CM

## 2024-05-21 DIAGNOSIS — Z8551 Personal history of malignant neoplasm of bladder: Secondary | ICD-10-CM | POA: Diagnosis not present

## 2024-05-21 DIAGNOSIS — K219 Gastro-esophageal reflux disease without esophagitis: Secondary | ICD-10-CM

## 2024-05-21 DIAGNOSIS — C679 Malignant neoplasm of bladder, unspecified: Secondary | ICD-10-CM | POA: Diagnosis not present

## 2024-05-21 DIAGNOSIS — K631 Perforation of intestine (nontraumatic): Secondary | ICD-10-CM | POA: Diagnosis not present

## 2024-05-21 DIAGNOSIS — Z932 Ileostomy status: Secondary | ICD-10-CM | POA: Diagnosis not present

## 2024-05-21 LAB — CBC WITH DIFFERENTIAL/PLATELET
Abs Immature Granulocytes: 0.19 K/uL — ABNORMAL HIGH (ref 0.00–0.07)
Basophils Absolute: 0.2 K/uL — ABNORMAL HIGH (ref 0.0–0.1)
Basophils Relative: 2 %
Eosinophils Absolute: 0.4 K/uL (ref 0.0–0.5)
Eosinophils Relative: 4 %
HCT: 41.4 % (ref 39.0–52.0)
Hemoglobin: 13.8 g/dL (ref 13.0–17.0)
Immature Granulocytes: 2 %
Lymphocytes Relative: 21 %
Lymphs Abs: 2.1 K/uL (ref 0.7–4.0)
MCH: 29.7 pg (ref 26.0–34.0)
MCHC: 33.3 g/dL (ref 30.0–36.0)
MCV: 89.2 fL (ref 80.0–100.0)
Monocytes Absolute: 1.6 K/uL — ABNORMAL HIGH (ref 0.1–1.0)
Monocytes Relative: 16 %
Neutro Abs: 5.6 K/uL (ref 1.7–7.7)
Neutrophils Relative %: 55 %
Platelets: 331 K/uL (ref 150–400)
RBC: 4.64 MIL/uL (ref 4.22–5.81)
RDW: 16.2 % — ABNORMAL HIGH (ref 11.5–15.5)
WBC: 10 K/uL (ref 4.0–10.5)
nRBC: 0 % (ref 0.0–0.2)

## 2024-05-21 LAB — RENAL FUNCTION PANEL
Albumin: 3.1 g/dL — ABNORMAL LOW (ref 3.5–5.0)
Anion gap: 14 (ref 5–15)
BUN: 85 mg/dL — ABNORMAL HIGH (ref 8–23)
CO2: 17 mmol/L — ABNORMAL LOW (ref 22–32)
Calcium: 10.3 mg/dL (ref 8.9–10.3)
Chloride: 96 mmol/L — ABNORMAL LOW (ref 98–111)
Creatinine, Ser: 2.1 mg/dL — ABNORMAL HIGH (ref 0.61–1.24)
GFR, Estimated: 33 mL/min — ABNORMAL LOW (ref >60)
Glucose, Bld: 99 mg/dL (ref 70–99)
Phosphorus: 5.6 mg/dL — ABNORMAL HIGH (ref 2.5–4.6)
Potassium: 4 mmol/L (ref 3.5–5.1)
Sodium: 127 mmol/L — ABNORMAL LOW (ref 135–145)

## 2024-05-21 LAB — COMPREHENSIVE METABOLIC PANEL WITH GFR
ALT: 93 U/L — ABNORMAL HIGH (ref 0–44)
AST: 53 U/L — ABNORMAL HIGH (ref 15–41)
Albumin: 3.1 g/dL — ABNORMAL LOW (ref 3.5–5.0)
Alkaline Phosphatase: 86 U/L (ref 38–126)
Anion gap: 11 (ref 5–15)
BUN: 84 mg/dL — ABNORMAL HIGH (ref 8–23)
CO2: 18 mmol/L — ABNORMAL LOW (ref 22–32)
Calcium: 10.3 mg/dL (ref 8.9–10.3)
Chloride: 97 mmol/L — ABNORMAL LOW (ref 98–111)
Creatinine, Ser: 2.14 mg/dL — ABNORMAL HIGH (ref 0.61–1.24)
GFR, Estimated: 32 mL/min — ABNORMAL LOW (ref >60)
Glucose, Bld: 100 mg/dL — ABNORMAL HIGH (ref 70–99)
Potassium: 4 mmol/L (ref 3.5–5.1)
Sodium: 126 mmol/L — ABNORMAL LOW (ref 135–145)
Total Bilirubin: 0.7 mg/dL (ref 0.0–1.2)
Total Protein: 7.2 g/dL (ref 6.5–8.1)

## 2024-05-21 LAB — GLUCOSE, CAPILLARY
Glucose-Capillary: 130 mg/dL — ABNORMAL HIGH (ref 70–99)
Glucose-Capillary: 117 mg/dL — ABNORMAL HIGH (ref 70–99)
Glucose-Capillary: 156 mg/dL — ABNORMAL HIGH (ref 70–99)
Glucose-Capillary: 147 mg/dL — ABNORMAL HIGH (ref 70–99)
Glucose-Capillary: 140 mg/dL — ABNORMAL HIGH (ref 70–99)

## 2024-05-21 LAB — PHOSPHORUS: Phosphorus: 5.5 mg/dL — ABNORMAL HIGH (ref 2.5–4.6)

## 2024-05-21 LAB — CREATININE, FLUID (PLEURAL, PERITONEAL, JP DRAINAGE): Creat, Fluid: 2 mg/dL

## 2024-05-21 LAB — MAGNESIUM: Magnesium: 2.4 mg/dL (ref 1.7–2.4)

## 2024-05-21 MED ORDER — INSULIN ASPART 100 UNIT/ML IJ SOLN
0.0000 [IU] | Freq: Three times a day (TID) | INTRAMUSCULAR | Status: DC
  Administered 2024-05-21 (×2): 3 [IU] via SUBCUTANEOUS
  Administered 2024-05-21: 4 [IU] via SUBCUTANEOUS
  Administered 2024-05-22: 3 [IU] via SUBCUTANEOUS

## 2024-05-21 MED ORDER — HYDROXYZINE HCL 25 MG PO TABS
25.0000 mg | ORAL_TABLET | Freq: Three times a day (TID) | ORAL | Status: DC | PRN
  Administered 2024-05-21 – 2024-05-22 (×2): 25 mg via ORAL
  Filled 2024-05-21 (×2): qty 1

## 2024-05-21 NOTE — Progress Notes (Signed)
 Occupational Therapy Treatment Patient Details Name: Kristopher Thompson MRN: [id number] DOB: 08-Apr-1951 Today's Date: 05/21/2024   History of present illness 73 y/o M admitted to [facility] 5/30 for hernia repair and urostomy revision. 6/9 to OR for perforated bowel, post op shock, s/p exploratory lapartomy with drainage of Rt abdominal wall abscess. CRRT 6/11-6/14. Extubated 6/14, Transferred to [facility] 6/15 for intermittent HD. CRRT again 6/17-6/20. 6/23 bil perc nephrostomy tubes placed. PMHx: bladder and prostate CA, RBBB, CHF, colostomy, DM, GERD, abdominal surgery.   OT comments  Patient with fair progress toward patient focused goals.  Continues to self limit his mobility, sitting prematurely and without warning.  Patient with improved ADL status, as he has been able to transition to ADL seated EOB.  OT to continue efforts in the acute setting to address deficits, and Patient will benefit from continued inpatient follow up therapy, <3 hours/day.      If plan is discharge home, recommend the following:  Two people to help with walking and/or transfers;Two people to help with bathing/dressing/bathroom;Assistance with cooking/housework;Assistance with feeding;Direct supervision/assist for medications management;Direct supervision/assist for financial management;Assist for transportation;Help with stairs or ramp for entrance   Equipment Recommendations  None recommended by OT    Recommendations for Other Services      Precautions / Restrictions Precautions Precautions: Fall Recall of Precautions/Restrictions: Impaired Precaution/Restrictions Comments: R drain (L drain d/c 7/7), ileostomy, urostomy, abd wound vac, bil perc nephrostomy drains Restrictions Weight Bearing Restrictions Per Provider Order: No       Mobility Bed Mobility   Bed Mobility: Sidelying to Sit   Sidelying to sit: Min assist, Mod assist            Transfers Overall transfer level: Needs assistance Equipment  used: Rolling walker (2 wheels) Transfers: Sit to/from Stand, Bed to chair/wheelchair/BSC Sit to Stand: Min assist Stand pivot transfers: Contact guard assist         General transfer comment: sits without warning.     Balance Overall balance assessment: Needs assistance Sitting-balance support: Feet supported Sitting balance-Leahy Scale: Fair     Standing balance support: Reliant on assistive device for balance Standing balance-Leahy Scale: Poor                             ADL either performed or assessed with clinical judgement   ADL   Eating/Feeding: Set up;Sitting   Grooming: Wash/dry hands;Wash/dry face;Set up;Sitting   Upper Body Bathing: Minimal assistance;Sitting   Lower Body Bathing: Maximal assistance;Sitting/lateral leans;Sit to/from stand   Upper Body Dressing : Moderate assistance;Sitting   Lower Body Dressing: Maximal assistance;Sitting/lateral leans;Sit to/from stand   Toilet Transfer: Minimal assistance;Stand-pivot;BSC/3in1                  Extremity/Trunk Assessment Upper Extremity Assessment Upper Extremity Assessment: Generalized weakness   Lower Extremity Assessment Lower Extremity Assessment: Defer to PT evaluation   Cervical / Trunk Assessment Cervical / Trunk Assessment: Kyphotic    Vision Baseline Vision/History: 1 Wears glasses Patient Visual Report: No change from baseline     Perception Perception Perception: Not tested   Praxis Praxis Praxis: Not tested   Communication Communication Communication: Impaired Factors Affecting Communication: Hearing impaired   Cognition Arousal: Alert Behavior During Therapy: Flat affect Cognition: No apparent impairments             OT - Cognition Comments: continues to exhibit self limiting behaviors.  Following commands: Intact Following commands impaired: Only follows one step commands consistently, Follows multi-step commands with increased  time      Cueing   Cueing Techniques: Verbal cues  Exercises      Shoulder Instructions       General Comments  VSS on RA    Pertinent Vitals/ Pain       Pain Assessment Pain Assessment: Faces Faces Pain Scale: No hurt Pain Intervention(s): Monitored during session                                                          Frequency  Min 2X/week        Progress Toward Goals  OT Goals(current goals can now be found in the care plan section)  Progress towards OT goals: Progressing toward goals  Acute Rehab OT Goals OT Goal Formulation: With patient Time For Goal Achievement: 05/29/24 Potential to Achieve Goals: Fair  Plan      Co-evaluation                 AM-PAC OT 6 Clicks Daily Activity     Outcome Measure   Help from another person eating meals?: A Little Help from another person taking care of personal grooming?: A Little Help from another person toileting, which includes using toliet, bedpan, or urinal?: A Lot Help from another person bathing (including washing, rinsing, drying)?: A Lot Help from another person to put on and taking off regular upper body clothing?: A Lot Help from another person to put on and taking off regular lower body clothing?: A Lot 6 Click Score: 14    End of Session Equipment Utilized During Treatment: Rolling walker (2 wheels)  OT Visit Diagnosis: Unsteadiness on feet (R26.81);Other abnormalities of gait and mobility (R26.89);Muscle weakness (generalized) (M62.81)   Activity Tolerance Patient limited by fatigue   Patient Left in chair;with call bell/phone within reach;with family/visitor present   Nurse Communication Mobility status        Time: 1030-1051 OT Time Calculation (min): 21 min  Charges: OT General Charges $OT Visit: 1 Visit OT Treatments $Self Care/Home Management : 8-22 mins  05/21/2024  RP, OTR/L  Acute Rehabilitation Services  Office:  [phone]   [name] 05/21/2024, 10:57 AM

## 2024-05-21 NOTE — Progress Notes (Signed)
 PROGRESS NOTE    Kristopher Thompson  FMW:[id number] DOB: 18-Oct-1951 DOA: 04/10/2024 PCP: [name], MD   Brief Narrative:  Kristopher Thompson is a 73 y.o. male with past medical history significant for HTN, chronic diastolic congestive heart failure, CAD, DM2, pulmonary embolism on Eliquis , anemia, obesity, prostate cancer, bladder cancer who was initially admitted by the general surgery service on 5/30 for operative management regarding parastomal and incisional incarcerated abdominal wall hernias; requiring extensive lysis, mesh insertion, repair, urostomy ileal conduit revision thereafter admitted to the ICU due to tachycardia and tachypnea. Initially placed on norepinephrine  and an arterial line was placed. Patient declined NG tube placement but eventually agreed. Patient was taken back to the OR on 6/9 due to perforated bowel and postop shock. Required multiple pressors, steroids and PRBC transfusion after washout/closure in the OR. On 6/11 developed AKI requiring dialysis.. Patient was extubated on 6/14.    Significant hospital events/procedures: 5/30: Admit to general surgery, robotic LOA, incisional/parastomal, left inguinal incarcerated hernia repair with mesh, urostomy ileal conduit revision, Dr. Sheldon 6/2: CCM consult and vasopressor 6/4: off NE. Pulled out his NGT refused replacement. After multiple emesis  6/5: NG tube replaced. 6/6: transferred to MedSurg. 6/9: OR for perforated bowel, drainage from right abdominal wall abscess, intra-abdominal abscess, small bowel resection, wound VAC; Dr. Tanda 6/10: NE, Phenylephrine , vasopressin , steroids, 1u PRBCs, back to OR for LOA, abdominal wall debridement, ileal resection, end ileostomy abdominal wall hernia repairs with mesh, fascial closure, wound VAC, Dr. Sheldon 6/11: Worsening AKI with fluid overload, start CRRT, decreasing pressor requirements 6/14: Off CRRT, arousable.  Extubated. 6/15: transferred to [facility] for iHD; montoring for  renal recovery 6/16: iHD, severely encephalopathic with high BUN 6/17: restarted CRRT for to ongoing uremia and encephalopathy 6/18: was awake enough to work with PT, OT 6/23: bilateral PCN placed by interventional radiology, Dr. Jennefer 6/24: MS imrpoving, Neph tubes in and draining 6/30: bilateral nephrostomy tube exchange by IR 7/2: Right percutaneous nephrostomy exchange with ureteral stent by IR, cortrak removed 7/7: Oral intake increased, completed micafungin /Zosyn     Assessment & Plan:  Principal Problem:   Delayed bowel perforation s/p SBR/end ileostomy Active Problems:   S/P ileal conduit (HCC)   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 08/08/2017   GERD (gastroesophageal reflux disease)   Obesity (BMI 35.0-39.9 without comorbidity)   Prolonged QT interval   Non-recurrent bilateral inguinal hernia without obstruction or gangrene   Incarcerated incisional hernia   Sinus tachycardia   Tachypnea   Acute respiratory insufficiency, postoperative   Sepsis due to undetermined organism (HCC)   Lactic acidosis   Class 2 obesity   Chronic anticoagulation   Hearing loss   History of bladder cancer   Obstructive sleep apnea of adult   Parastomal hernia of ileal conduit   Partial small bowel obstruction (HCC)   Personal history of PE (pulmonary embolism)   Pressure injury of skin   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 Sheldon on 04/10/2024.  Postoperatively patient required vasopressors and was transferred to the ICU.  Subsequent return to the OR  on 04/20/2024 for bowel perforation underwent I&D of abdominal  wall/intra-abdominal abscess with small bowel resection.  Returned to OR on 6/10 for further abdominal wall debridement, LOA, ileal resection, end ileostomy with abdominal wall hernia repairs with mesh, fascial closure with wound VAC.  Seen by infectious disease, Dr. Dea; with recommendation of 4-6-week course of Zosyn  and micafungin  treatment given mesh placed in a contaminated field with completion of treatment on 05/18/2024. -Surgery following, appreciate assistance -Wound VAC changes twice weekly Monday/Thursday -Further per General Surgery diet was changed to regular diet.  Given that he continues have ileostomy output bowel regimen has been initiated and has been given soluble Polly fiber/FiberCon to thicken and also the general surgery team and added loperamide  to help constipate the patient - Anticipate discharging to SNF in the next 24 to 48 hours   Acute Renal Failure secondary to ischemic ATN/Shock Bilateral Hydronephrosis/Obstruction -Urology following, appreciate assistance -Cr (0.97 04/01/2024). BUN/Cr Trend: Recent Labs  Lab 05/17/24 0552 05/18/24 0207 05/18/24 0600 05/19/24 0600 05/20/24 0354 05/21/24 0650 05/21/24 0651  BUN 69* 57* 73* 62* 69* 85* 84*  CREATININE 1.92* 1.72* 2.08* 1.74* 1.94* 2.10* 2.14*  -Avoid Nephrotoxic Medications, Contrast Dyes, Hypotension and Dehydration to Ensure Adequate Renal Perfusion and will need to Renally Adjust Meds -Continue to Monitor and Trend Renal Function carefully and repeat CMP in the AM  -Required CRRT followed by HD while inpatient, last HD 6/23; nephrology now signed off -S/p bilateral PCN on 6/23, exchanged 6/30; right exchanged 7/2  [name]: Supportive Care. C/w Metaclopramide 5 mg TID, Ondansetron  4 mg q6hprn Nausea and Vomiting, and Prochlorperazine  10 mg IV q6hprn Refractory N/V.  He had a better day today and was tolerating food with mild nausea.    Urine leak w/ Hx prior radical cystectomy/ileal conduit urinary diversion: CT loopogram 7/1 with finding of urine leak identified at inferior butt end of ileal conduit. Right percutaneous nephrostomy exchange with ureteral stent 7/2 by IR.  Further per Urology and Interventional Radiology.  Urology evaluated and they feel that he has remained clinically stable this week and recommending continued bilateral PCN drainage and abdominal drain.  They feel the outputs suggest decreasing drain output which is encouraging and they feel that the fluid in his abdominal drain is likely urine but going to check a creatinine level to confirm.  For urology standpoint they feel is okay for him to be discharged to SNF with drains left in place and will have outpatient follow-up in about 2 weeks to reassess.   Type 2 Diabetes Mellitus: On metformin 500 mg p.o. daily outpatient.  Hemoglobin A1c 5.3 on 04/01/2024. C/w Semglee  25u [place] at bedtime and  Resistant SSI for coverage. CBGs ranging from 117-156   Acute Blood Loss Anemia Right Flank Hematoma Anemia of Chronic Medical Disease Anemia panel with iron  54, TIBC 281, ferritin 588 -Hgb/Hct Trend:  Recent Labs  Lab 05/13/24 0500 05/14/24 0315 05/15/24 0610 05/16/24 0326 05/18/24 0600 05/20/24 0845 05/21/24 0651  HGB 12.1* 11.7* 11.7* 12.9* 13.1 13.7 13.8  HCT 37.4* 37.1* 36.8* 40.3 39.0 40.1 41.4  MCV 92.6 93.9 92.5 93.1 90.9 89.3 89.2  -CTM for S/Sx of Bleeding/No overt bleeding noted. Repeat CBC in the AM   Chronic Diastolic Congestive Heart Failure Essential HTN -C/w Hydralazine  20 mg IV every 4 hours as needed SBP greater than 160. CTM BP per Protocol. Last BP reading was 103/74   HLD: C/w Rosuvastatin  10 mg p.o. nightly   History of Pulmonary Embolism: C/w Eliquis  5 mg p.o. twice daily   Weakness/ability/deconditioning:  PT/OT initially recommended CIR but now recommending SNF. Continue therapy efforts while inpatient. Has bed @  [facility]  Hypoalbuminemia: Patient's Albumin  Level ranging from 3.0-3.3. (3.1 today) CTM and Trend and repeat CMP in the AM  Moderate Protein Calorie Malnutrition: Nutrition Status:  Nutrition Problem: Increased nutrient needs Etiology: acute illness Signs/Symptoms: estimated needs Interventions: Tube feeding, Prostat, Juven  Class I Obesity: Complicates overall prognosis and care. Estimated body mass index is 30.53 kg/m as calculated from the following:   Height as of this encounter: 5' 11 (1.803 m).   Weight as of this encounter: 99.3 kg. Weight Loss and Dietary Counseling given   DVT prophylaxis: Place TED hose Start: 04/28/24 0818 SCD's Start: 04/10/24 1834 apixaban  (ELIQUIS ) tablet 5 mg    Code Status: Full Code Family Communication: D/w wife @ bedside  Disposition Plan:  Level of care: Med-Surg Status is: Inpatient Remains inpatient appropriate because: Has a bed available at SNF on 7/11. Now stable for D/C from Urological and Surgical Standpoint   Consultants:  General Surgery Urology Interventional Radiology Infectious Diseases Nephrology PCCM  Procedures:  As delineated as above  Antimicrobials:  Anti-infectives (From admission, onward)    Start     Dose/Rate Route Frequency Ordered Stop   05/06/24 1400  piperacillin -tazobactam (ZOSYN ) IVPB 3.375 g        3.375 g 12.5 mL/hr over 240 Minutes Intravenous Every 8 hours 05/06/24 1227 05/19/24 0900   05/01/24 1400  piperacillin -tazobactam (ZOSYN ) IVPB 2.25 g  Status:  Discontinued        2.25 g 100 mL/hr over 30 Minutes Intravenous Every 8 hours 05/01/24 0957 05/06/24 1227   05/01/24 1045  micafungin  (MYCAMINE ) 100 mg in sodium chloride  0.9 % 100 mL IVPB        100 mg 105 mL/hr over 1 Hours Intravenous Every 24 hours 05/01/24 0949 05/18/24 1651   04/29/24 1000  micafungin  (MYCAMINE ) 150 mg in sodium chloride  0.9 % 100 mL IVPB  Status:  Discontinued        150 mg 107.5 mL/hr over 1 Hours Intravenous Every 24  hours 04/28/24 1036 05/01/24 0949   04/28/24 1345  micafungin  (MYCAMINE ) 100 mg in sodium chloride  0.9 % 100 mL IVPB        100 mg 105 mL/hr over 1 Hours Intravenous  Once 04/28/24 1245 04/28/24 1354   04/28/24 1000  micafungin  (MYCAMINE ) 100 mg in sodium chloride  0.9 % 100 mL IVPB  Status:  Discontinued        100 mg 105 mL/hr over 1 Hours Intravenous Every 24 hours 04/27/24 1219 04/28/24 1036   04/21/24 1400  clindamycin  (CLEOCIN ) 900 mg, gentamicin  (GARAMYCIN ) 240 mg in sodium chloride  0.9 % 1,000 mL for intraperitoneal lavage  Status:  Discontinued         Irrigation To Surgery 04/21/24 1346 04/21/24 1618   04/20/24 1400  piperacillin -tazobactam (ZOSYN ) IVPB 3.375 g  Status:  Discontinued        3.375 g 12.5 mL/hr over 240 Minutes Intravenous Every 8 hours 04/20/24 0755 05/01/24 0957   04/20/24 1000  metroNIDAZOLE  (FLAGYL ) IVPB 500 mg  Status:  Discontinued        500 mg 100 mL/hr over 60 Minutes Intravenous Every 12 hours 04/20/24 0320 04/20/24 0752   04/20/24 1000  micafungin  (MYCAMINE ) 150 mg in sodium chloride  0.9 % 100 mL IVPB  Status:  Discontinued        150 mg 107.5 mL/hr over 1 Hours Intravenous Every 24 hours 04/20/24 0752 04/27/24  1219   04/20/24 0245  ceFEPIme  (MAXIPIME ) 2 g in sodium chloride  0.9 % 100 mL IVPB  Status:  Discontinued        2 g 200 mL/hr over 30 Minutes Intravenous Every 8 hours 04/20/24 0232 04/20/24 0752   04/20/24 0100  metroNIDAZOLE  (FLAGYL ) IVPB 500 mg        500 mg 100 mL/hr over 60 Minutes Intravenous On call to O.R. 04/20/24 0004 04/20/24 0100   04/14/24 1800  ceFEPIme  (MAXIPIME ) 2 g in sodium chloride  0.9 % 100 mL IVPB        2 g 200 mL/hr over 30 Minutes Intravenous Every 8 hours 04/14/24 1003 04/19/24 0957   04/14/24 1600  vancomycin  (VANCOCIN ) IVPB 1000 mg/200 mL premix  Status:  Discontinued        1,000 mg 200 mL/hr over 60 Minutes Intravenous Every 24 hours 04/13/24 1420 04/14/24 1000   04/14/24 1600  vancomycin  (VANCOREADY) IVPB 1500  mg/300 mL  Status:  Discontinued        1,500 mg 150 mL/hr over 120 Minutes Intravenous Every 24 hours 04/14/24 1002 04/16/24 0744   04/14/24 1200  vancomycin  (VANCOCIN ) IVPB 1000 mg/200 mL premix  Status:  Discontinued        1,000 mg 200 mL/hr over 60 Minutes Intravenous Every 24 hours 04/13/24 1053 04/13/24 1109   04/13/24 2200  ceFEPIme  (MAXIPIME ) 2 g in sodium chloride  0.9 % 100 mL IVPB  Status:  Discontinued        2 g 200 mL/hr over 30 Minutes Intravenous Every 12 hours 04/13/24 1036 04/13/24 1109   04/13/24 2200  ceFEPIme  (MAXIPIME ) 2 g in sodium chloride  0.9 % 100 mL IVPB  Status:  Discontinued        2 g 200 mL/hr over 30 Minutes Intravenous Every 12 hours 04/13/24 1425 04/14/24 1003   04/13/24 1500  vancomycin  (VANCOCIN ) 2,000 mg in sodium chloride  0.9 % 500 mL IVPB        2,000 mg 260 mL/hr over 120 Minutes Intravenous  Once 04/13/24 1409 04/13/24 1850   04/13/24 1500  ceFEPIme  (MAXIPIME ) 2 g in sodium chloride  0.9 % 100 mL IVPB  Status:  Discontinued        2 g 200 mL/hr over 30 Minutes Intravenous Every 12 hours 04/13/24 1420 04/13/24 1425   04/13/24 1500  metroNIDAZOLE  (FLAGYL ) IVPB 500 mg  Status:  Discontinued        500 mg 100 mL/hr over 60 Minutes Intravenous Every 12 hours 04/13/24 1420 04/17/24 0725   04/13/24 1200  vancomycin  (VANCOCIN ) 2,000 mg in sodium chloride  0.9 % 500 mL IVPB  Status:  Discontinued        2,000 mg 260 mL/hr over 120 Minutes Intravenous  Once 04/13/24 1052 04/13/24 1121   04/13/24 1100  metroNIDAZOLE  (FLAGYL ) IVPB 500 mg  Status:  Discontinued        500 mg 100 mL/hr over 60 Minutes Intravenous 2 times daily 04/13/24 1007 04/13/24 1109   04/13/24 1100  vancomycin  (VANCOCIN ) IVPB 1000 mg/200 mL premix  Status:  Discontinued        1,000 mg 200 mL/hr over 60 Minutes Intravenous  Once 04/13/24 1007 04/13/24 1020   04/13/24 1015  ceFEPIme  (MAXIPIME ) 2 g in sodium chloride  0.9 % 100 mL IVPB        2 g 200 mL/hr over 30 Minutes Intravenous STAT  04/13/24 1007 04/14/24 1721   04/10/24 2200  ceFAZolin  (ANCEF ) IVPB 2g/100 mL premix  2 g 200 mL/hr over 30 Minutes Intravenous Every 8 hours 04/10/24 1833 04/11/24 0531   04/10/24 0600  ceFAZolin  (ANCEF ) IVPB 2g/100 mL premix        2 g 200 mL/hr over 30 Minutes Intravenous On call to O.R. 04/10/24 0533 04/10/24 1526       Subjective: Seen and examined at bedside and he is feeling better and not as nauseous.  Wife states that he is eating more.  Denies any lightheadedness or dizziness.  Has a bed available at [facility] tomorrow and wanted to get stronger.  Objective: Vitals:   05/21/24 0803 05/21/24 1123 05/21/24 1805 05/21/24 1935  BP: 98/69 95/73 104/74 103/74  Pulse: 91 99 100 (!) 108  Resp: 18 18 18 16   Temp: 97.6 F (36.4 C) 97.6 F (36.4 C) 97.6 F (36.4 C) 97.8 F (36.6 C)  TempSrc: Oral Oral Oral Oral  SpO2: 99% 97% 97% 95%  Weight:      Height:        Intake/Output Summary (Last 24 hours) at 05/21/2024 2117 Last data filed at 05/21/2024 1753 Gross per 24 hour  Intake --  Output 1475 ml  Net -1475 ml   Filed Weights   05/04/24 2200 05/06/24 0500 05/07/24 0500  Weight: 44.9 kg 100.4 kg 99.3 kg   Examination: Physical Exam:  Constitutional: Chronically ill-appearing Caucasian male who appears more comfortable today compared to yesterday Respiratory: Diminished to auscultation bilaterally, no wheezing, rales, rhonchi or crackles. Normal respiratory effort and patient is not tachypenic. No accessory muscle use.  Unlabored breathing Cardiovascular: RRR, no murmurs / rubs / gallops. S1 and S2 auscultated. No extremity edema.   Abdomen: Soft, non-tender, distended secondary body habitus bowel sounds positive.  Has a right lower quadrant drain and a right upper quadrant and ileostomy in the right lower quadrant urostomy GU: Deferred.  Has urostomy and left nephrostomy tubes. Musculoskeletal: No clubbing / cyanosis of digits/nails. No joint deformity upper and lower  extremities. Good ROM, no contractures. Normal strength and muscle tone.  Skin: No rashes, lesions, ulcers limited skin evaluation. No induration; Warm and dry.  Neurologic: CN 2-12 grossly intact with no focal deficits. Romberg sign and cerebellar reflexes not assessed.  Psychiatric: Normal judgment and insight. Alert and oriented x 3.   Data Reviewed: I have personally reviewed following labs and imaging studies  CBC: Recent Labs  Lab 05/15/24 0610 05/16/24 0326 05/18/24 0600 05/20/24 0845 05/21/24 0651  WBC 8.8 8.8 10.4 10.3 10.0  NEUTROABS  --   --   --  6.6 5.6  HGB 11.7* 12.9* 13.1 13.7 13.8  HCT 36.8* 40.3 39.0 40.1 41.4  MCV 92.5 93.1 90.9 89.3 89.2  PLT 221 232 313 316 331   Basic Metabolic Panel: Recent Labs  Lab 05/18/24 0207 05/18/24 0600 05/19/24 0600 05/20/24 0354 05/21/24 0650 05/21/24 0651  NA 130* 128* 129* 127* 127* 126*  K 3.7 4.0 3.6 3.8 4.0 4.0  CL 101 98 102 99 96* 97*  CO2 17* 17* 17* 17* 17* 18*  GLUCOSE 103* 136* 95 104* 99 100*  BUN 57* 73* 62* 69* 85* 84*  CREATININE 1.72* 2.08* 1.74* 1.94* 2.10* 2.14*  CALCIUM  9.7 9.7 10.0 10.0 10.3 10.3  MG  --   --   --   --   --  2.4  PHOS 4.8*  --  4.6 4.8* 5.6* 5.5*   GFR: Estimated Creatinine Clearance: 38 mL/min (A) (by C-G formula based on SCr of 2.14 mg/dL (H)). Liver  Function Tests: Recent Labs  Lab 05/18/24 0600 05/19/24 0600 05/20/24 0354 05/21/24 0650 05/21/24 0651  AST 46*  --   --   --  53*  ALT 74*  --   --   --  93*  ALKPHOS 85  --   --   --  86  BILITOT 0.5  --   --   --  0.7  PROT 7.4  --   --   --  7.2  ALBUMIN  3.1* 3.1* 3.2* 3.1* 3.1*   No results for input(s): LIPASE, AMYLASE in the last 168 hours. No results for input(s): AMMONIA in the last 168 hours. Coagulation Profile: No results for input(s): INR, PROTIME in the last 168 hours. Cardiac Enzymes: No results for input(s): CKTOTAL, CKMB, CKMBINDEX, TROPONINI in the last 168 hours. BNP (last 3  results) No results for input(s): PROBNP in the last 8760 hours. HbA1C: No results for input(s): HGBA1C in the last 72 hours. CBG: Recent Labs  Lab 05/21/24 0258 05/21/24 0800 05/21/24 1132 05/21/24 1645 05/21/24 2112  GLUCAP 130* 117* 156* 147* 140*   Lipid Profile: No results for input(s): CHOL, HDL, LDLCALC, TRIG, CHOLHDL, LDLDIRECT in the last 72 hours. Thyroid  Function Tests: No results for input(s): TSH, T4TOTAL, FREET4, T3FREE, THYROIDAB in the last 72 hours. Anemia Panel: No results for input(s): VITAMINB12, FOLATE, FERRITIN, TIBC, IRON , RETICCTPCT in the last 72 hours. Sepsis Labs: No results for input(s): PROCALCITON, LATICACIDVEN in the last 168 hours.  No results found for this or any previous visit (from the past 240 hours).   Radiology Studies: No results found.  Scheduled Meds:  (feeding supplement) PROSource Plus  30 mL Oral BID BM   apixaban   5 mg Oral BID   Chlorhexidine  Gluconate Cloth  6 each Topical Daily   vitamin B-12  1,000 mcg Oral Daily   feeding supplement  237 mL Oral BID BM   insulin  aspart  0-20 Units Subcutaneous TID AC & HS   insulin  glargine-yfgn  25 Units Subcutaneous QHS   loperamide   2 mg Oral BID   melatonin  5 mg Oral QHS   metoCLOPramide   5 mg Oral TID AC   multivitamin with minerals  1 tablet Oral Daily   nutrition supplement (JUVEN)  1 packet Oral BID BM   mouth rinse  15 mL Mouth Rinse 4 times per day   polycarbophil  625 mg Oral BID   rosuvastatin   10 mg Oral QPM   sodium chloride  flush  5 mL Intracatheter Q8H   Continuous Infusions:  sodium chloride  Stopped (05/01/24 0014)   albumin  human     anticoagulant sodium citrate       LOS: 41 days   Alejandro Marker, DO [facility] Available via Epic secure chat 7am-7pm After these hours, please refer to coverage provider listed on [email or web address] 05/21/2024, 9:17 PM

## 2024-05-21 NOTE — Consult Note (Signed)
 WOC Nurse wound follow up Wound type: surgical Measurement: 14.5 cm x 5 cm x 2.5 cm Wound bed: Red, moist, granulation tissue through out   Drainage (amount, consistency, odor) serosanguinous in cannister Periwound: intact Dressing procedure/placement/frequency: Removed old NPWT dressing Cleansed wound with normal saline  Filled wound with  3  piece of black foam, barrier ring placed at the umbilicus  Sealed NPWT dressing at HG  Patient tolerated procedure well  WOC nurse will continue to provide NPWT dressing changed due to the complexity of the dressing change.   WOC Nurse ostomy follow up Stoma type/location: RLQ ileal conduit, RUQ ileostomy Stomal assessment/size:  RLQ ileal conduit: oval approximately 1 in x 1 1/4 in  RUQ ileostomy: see prior note Peristomal assessment: intact Treatment options for stomal/peristomal skin: 2 inch barrier ring Output  Clear urine Liquid brown stool Ostomy pouching: 1pc convex urostomy pouch Soila [phone]) with 2 barrier rings Soila # [phone]),  1pc soft convex ileostomy Soila # [phone])  Education provided: None Enrolled patient in [place] Secure Start Discharge program: Yes  Patient reports ileostomy pouch changed by nursing on 7/9 due to leaking, current appliance intact, will not change this visit.  Urostomy appliance changed this visit.  WOC team will continue to provide NPWT dressing changed due to the complexity of the dressing change and ostomy teaching as needed.   Doyal Polite, RN, MSN, [facility] WOC Team

## 2024-05-21 NOTE — Progress Notes (Signed)
 Patient ID: LAKAI MOREE, male   DOB: [birth year], 73 y.o.   MRN: [id number]   30 Days Post-Op Subjective: Pt without complaints today.  Objective: Vital signs in last 24 hours: Temp:  [97.6 F (36.4 C)-98.1 F (36.7 C)] 97.6 F (36.4 C) (07/10 1123) Pulse Rate:  [91-102] 99 (07/10 1123) Resp:  [16-18] 18 (07/10 1123) BP: (95-108)/(69-96) 95/73 (07/10 1123) SpO2:  [93 %-99 %] 97 % (07/10 1123)  Intake/Output from previous day: 07/09 0701 - 07/10 0700 In: 965 [P.O.:960; I.V.:5] Out: 1995 [Urine:925; Drains:95; Stool:975] Intake/Output this shift: Total I/O In: -  Out: 415 [Urine:290; Stool:125]  Physical Exam:  General: Alert and oriented GU: Urostomy pink with a small amount of urine in bag.  Bilateral nephrostomy tubes draining well.  Lab Results: Recent Labs    05/20/24 0845 05/21/24 0651  HGB 13.7 13.8  HCT 40.1 41.4   BMET Recent Labs    05/21/24 0650 05/21/24 0651  NA 127* 126*  K 4.0 4.0  CL 96* 97*  CO2 17* 18*  GLUCOSE 99 100*  BUN 85* 84*  CREATININE 2.10* 2.14*  CALCIUM  10.3 10.3     Studies/Results: No results found.  Assessment/Plan: 1) Urine leak s/p h/o prior radical cystectomy/ileal conduit urinary diversion in past: He has remained clinically stable this week.  Continue bilateral PCN drainage and abdominal drain.  Outputs suggest decreasing drain output which is encouraging.  Suspect fluid in abdominal drain is still likely urine but will check a Cr level to confirm.  It would be ok for him to be discharged to SNF from my standpoint with drains left in place.  Will arrange f/u with me in about 2 weeks to reassess.   LOS: 41 days   Noretta Ferrara 05/21/2024, 5:14 PM

## 2024-05-21 NOTE — TOC Progression Note (Signed)
 Transition of Care [facility]) - Progression Note    Patient Details  Name: [name] MRN: [id number] Date of Birth: 09/10/1951  [facility]) CM/SW Contact  Montie LOISE Louder, [place] Phone Number: 05/21/2024, 12:17 PM  Clinical Narrative:     CSW informed SNF/ [place]  anticipated d/c tomorrow  SNF requested patient brings  3 days supply of colostomy and urostomy supplies   Montie Louder, MSW, LCSW Clinical Social Worker    Expected Discharge Plan: Skilled Nursing Facility Barriers to Discharge: Continued Medical Work up  Expected Discharge Plan and Services In-house Referral: Clinical Social Work Discharge Planning Services: [employer] Consult Post Acute Care Choice: Skilled Nursing Facility Living arrangements for the past 2 months: Single Family Home                                       Social Determinants of Health (SDOH) Interventions SDOH Screenings   Food Insecurity: No Food Insecurity (04/12/2024)  Housing: Low Risk  (04/12/2024)  Transportation Needs: No Transportation Needs (04/12/2024)  Utilities: Not At Risk (04/12/2024)  Social Connections: Socially Integrated (04/10/2024)  Tobacco Use: Medium Risk (04/10/2024)    Readmission Risk Interventions    04/12/2024    4:58 PM  Readmission Risk Prevention Plan  Transportation Screening Complete  PCP or Specialist Appt within 3-5 Days Complete  HRI or Home Care Consult Complete  Social Work Consult for Recovery Care Planning/Counseling Complete  Palliative Care Screening Not Applicable  Medication Review Ocean[employer]) Complete

## 2024-05-21 NOTE — Progress Notes (Addendum)
 05/21/2024  Kristopher Thompson [id number] [birth year]  CARE TEAM: PCP: [name], MD  Outpatient Care Team: Patient Care Team: [name], MD as PCP - General (Internal Medicine) [name], MD (Inactive) as Consulting Physician (Dermatology) [name], MD as Referring Physician (Pulmonary Disease) [name], MD as Consulting Physician (General Surgery) [name], MD as Consulting Physician (Urology)  Inpatient Treatment Team: Treatment Team:  Sherrill Alejandro Donovan, DO [name], MD [name], MD Massie Delaine SAUNDERS, RN Dea Shiner, MD Ruthellen [facility], MD Luverne Aran, MD [name], MD [name], NT [name], MD Theophilus Caren FORBES, RN Jeffie Charlie BIRCH, OT   Problem List:   Principal Problem:   Delayed bowel perforation s/p SBR/end ileostomy Active Problems:   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 08/08/2017   S/P ileal conduit (HCC)   GERD (gastroesophageal reflux disease)   Obesity (BMI 35.0-39.9 without comorbidity)   Prolonged QT interval   Non-recurrent bilateral inguinal hernia without obstruction or gangrene   Incarcerated incisional hernia   Sinus tachycardia   Tachypnea   Acute respiratory insufficiency, postoperative   Sepsis due to undetermined organism (HCC)   Lactic acidosis   Class 2 obesity   Chronic anticoagulation   Hearing loss   History of bladder cancer   Obstructive sleep apnea of adult   Parastomal hernia of ileal conduit   Partial small bowel obstruction (HCC)   Personal history of PE (pulmonary embolism)   Pressure injury of skin   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   Ileostomy in place [facility])   H/O insertion of nephrostomy tube   04/10/2024  POST-OPERATIVE DIAGNOSIS:  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   PROCEDURE:   -ROBOTIC LYSIS OF ADHESIONS X 4 HOURS -COMPONENT  SEPARATION - TRANSVERSUS ABDOMINIS REALEASE (TAR) BILATERAL -ROBOTIC REPAIRS OF  INCISIONAL, PARASTOMAL, LEFT INGUINAL  INCARCERATED ABDOMINAL WALL HERNIAS WITH MESH -UROSTOMY ILEAL CONDUIT REVISION -INTRAOPERATIVE ASSESSMENT OF TISSUE VASCULAR PERFUSION USING ICG (indocyanine green ) -IMMUNOFLUORESCENCE -TRANSVERSUS ABDOMINIS PLANE (TAP) BLOCK - BILATERAL    SURGEON:  [name], MD   04/20/2024 POST-OPERATIVE DIAGNOSIS:  perforated bowel   PROCEDURE:  Drainage of right abdominal wall abscess as well as intra-abdominal abscess Explantation of abdominal mesh Small bowel resection (in discontinuity) Placement of ABThera wound VAC   SURGEON:  [name], MD  04/21/2024  POST-OPERATIVE DIAGNOSIS:  DELAYED BOWEL PERFORATION WITH OPEN ABDOMEN   PROCEDURE:   LYSIS OF ADHESIONS X ABDOMINAL WALL DEBRIDEMENT ILEAL RESECTION END ILEOSTOMY ABDOMINAL WALL PARASTOMAL & INCISIONAL HERNIA REPAIRS WITH PHASIX MESH FASCIA CLOSURE WITH WOUND VAC PLACEMENT IN SQ   SURGEON:  [name], MD    05/04/2024 Interventional Radiology Procedure Note   Procedure: Bilateral percutaneous nephrostomy tube placements   Findings: Please refer to procedural dictation for full description. 10 Fr bilateral to bag drainage.   Complications: None immediate   Estimated Blood Loss: < 5 ml   Recommendations: Keep to bag drainage.  IR will follow.  Ultimate management as per Urology.   Assessment  -Incarcerated parastomal incisional hernias with obstructive symptoms status post robotic takedown and repair -Delayed small bowel perforation s/p ileal resection and end ileostomy -Delayed urine leak of urostomy ileal conduit status post perc nephrostomy tubes -Multisystem organ failure gradually resolving  SLOWLY IMPROVING   Plan:  Delayed urine leak with AKI:  Urinary diversion   Internal:  with percutaneous nephrostomy tubes.  Adjusted 6/30, 7/1, 7/3 with stent going  from kidney out  urostomy across the presumed mid ileal on the right side leak.  Urine draining into urostomy and right lower quadrant surgical Blake drain in the abdominal wall resting just outside the ileal conduit.  Left lower quadrant intraperitoneal Blake surgical drain with minimal output through the weekend.  I removed it 7/8.  Nephrostomy tubes & stenting per [name] [facility].    Hopefully the small ileal conduit leak will seal on its own with internal stenting and external drainage and improve nutrition.  More output through nephrostomy tubes and urostomy stent, much less so in the abdominal wall drain = a guardedly hopeful sign.  Dr. Renda most likely wishing to send off nephrostomy tube, urostomy effluent, and abdominal wall surgical Blake drain fluid for creatinine to compare and assess persistence/complexity of ileal conduit leak  If not controlled with urinary diversion and stenting, he may require revision of his ileal conduit.  That would be very high risk.  Understand they wish to hold off but do not want to have nephrostomy tubes indefinitely either if possible.  Defer to Dr. Renda urology with interventional radiology   Serum BUN & creatinine slowly coming down.  Likelihood of repeat dialysis seems rather low.  Nephrology following more peripherally  Nutrition:  PO Diet as tolerated.  Patient does not like renal diet and wishes to get back to a regular diet.  I wrote to switch to see how that goes.  If numbers worsening, may need to go back to a more restricted diet.  See what medicine/nephrology think  Still with intermittent nausea.  Will add low-dose metoclopramide  to see if that helps.  Normally I would do ondansetron  but given QT concerns will hold off  Bowel regimen for ileostomy.  soluble polyfiber/FiberCon to thicken.  Added loperamide  to help constipate.  Twice daily seems to be worse  Weaned off TPN 6/16  Corpak removed 7/2  Hold off on G-tube unless poor PO effort  >14days  Infection due to delayed bowel perforation and abdominal wall infection/necrosis  Growing multiple organisms including Candida albicans, Enterobacter, and Enterococcus.    IV Zosyn /micafungin  x6 weeks, completed 7/7.    Midline incision wound  would benefit with continued wound VAC   Metabolic encephalopathy resolved.  Follow.  Insomnia improved  ABLA on top of anemia of chronic disease improved with transfusion.  Follow.    -monitor electrolytes & replace as needed.  Keep K>4, Mg>2, Phos>3.    -Diabetes.  Sliding scale insulin .    -VTE prophylaxis-  Full anticoagulation given history of pulmonary embolism.  Eliquis .  -Mobilize as tolerated.  Starting to work with physical and Occupational Therapy more.  He tends to refuse to get out of bed.  External encouraged him to get out of bed to rehab more.    I updated the patient's status to the the patient, wife, and nurses.  Recommendations were made.  Questions were answered.  They expressed understanding & appreciation.  -Disposition: Plan to move to skilled nursing facility with some rehab capabilities.  I believe [facility] skilled nursing facility  STABLE TO D/C FROM SURGERY STANDPOINT      I reviewed nursing notes, last 24 h vitals and pain scores, last 48 h intake and output, last 24 h labs and trends, and last 24 h imaging results.  I have reviewed this patient's available data, including medical history, events of note, test results, etc as part of my evaluation.   A significant portion of that time was  spent in counseling. Care during the described time interval was provided by me.  This care required high  level of medical decision making.  05/21/2024    Subjective: (Chief complaint)  Patient with less nausea.  Denies any pain.  Nurse in room.    Objective:  Vital signs:  Vitals:   05/20/24 1143 05/20/24 1645 05/20/24 2014 05/21/24 0228  BP: 111/74 101/66 104/77   Pulse:  (!) 108 (!)  102   Resp: 16 18 18    Temp: 97.8 F (36.6 C) 98 F (36.7 C) 97.6 F (36.4 C) 97.7 F (36.5 C)  TempSrc: Oral Oral Oral Oral  SpO2:  98% 93%   Weight:      Height:        Last BM Date : 05/20/24  Intake/Output   Yesterday:  07/09 0701 - 07/10 0700 In: 965 [P.O.:960; I.V.:5] Out: 1995 [Urine:925; Drains:95; Stool:975] This shift:  Total I/O In: -  Out: 675 [Urine:275; Drains:50; Stool:350]  Bowel function:  Flatus: YES  BM:  YES  Drains:  RLQ drain (rests between abdominal wall and Phasix mesh): Serous low volume. R & L back nephrostomy tubes in place.  - light yellow-colored -scant on left  Physical Exam:  General: Resting in no acute distress.  Legs crossed looking relaxed.  Mentally back to baseline - chatty today.   Eyes: Glasses PERRL, normal EOM.  Sclera clear.  No icterus Neuro: CN II-XII intact w/o focal sensory/motor deficits. Lymph: No head/neck/groin lymphadenopathy Psych:  No delerium/psychosis/paranoia.  No agitation HENT: Normocephalic, Mucus membranes moist.  No thrush.  Mildly hard of hearing Neck: Supple, No tracheal deviation.  No obvious thyromegaly Chest: No pain to chest wall compression.  Good respiratory excursion.   CV:  Pulses intact.  Regular rhythm.  1-2+ BUE/BLE edema MS:  No obvious deformity  Abdomen:  Obese Soft.  Nondistended.  Nontender Wound vac (in SQ over closed fascia) in midline.    RUQ: (End ileostomy): Pink mucosa.  Brown thicker effluent in bag RLQ:  (Urostomy ileal conduit): Pink mucosa without any obvious separation.  Moderate clear light orange.  Stent out ostomy  Ext:   No deformity.  No edema.  No cyanosis Skin: No petechiae / purpurea.  No major sores.  Warm and dry    Results:   Cultures: Recent Results (from the past 720 hours)  Aerobic/Anaerobic Culture w Gram Stain (surgical/deep wound)     Status: None   Collection Time: 04/21/24  2:23 PM   Specimen: Soft Tissue, Other  Result Value Ref Range Status    Specimen Description   Final    TISSUE INTRA ABDOMINAL NECROTIC TISSUE Performed at [facility], [address] [place], [place] [address]    Special Requests   Final    NONE Performed at [facility], [address] [place], [place] [address]    Gram Stain   Final    FEW WBC PRESENT,BOTH PMN AND MONONUCLEAR RARE GRAM POSITIVE COCCI IN PAIRS AND CHAINS RARE YEAST    Culture   Final    RARE ESCHERICHIA COLI FEW CANDIDA ALBICANS FEW ENTEROCOCCUS FAECALIS FEW BACTEROIDES SPECIES BETA LACTAMASE POSITIVE Performed at [facility], [address], [place], [place] [address]    Report Status 04/25/2024 FINAL  Final   Organism ID, Bacteria ESCHERICHIA COLI  Final   Organism ID, Bacteria ENTEROCOCCUS FAECALIS  Final      Susceptibility   Escherichia coli - MIC*  AMPICILLIN >=32 RESISTANT Resistant     CEFEPIME  <=0.12 SENSITIVE Sensitive     CEFTAZIDIME <=1 SENSITIVE Sensitive     CEFTRIAXONE  1 SENSITIVE Sensitive     CIPROFLOXACIN  <=0.25 SENSITIVE Sensitive     GENTAMICIN  <=1 SENSITIVE Sensitive     IMIPENEM <=0.25 SENSITIVE Sensitive     TRIMETH /SULFA  <=20 SENSITIVE Sensitive     AMPICILLIN/SULBACTAM >=32 RESISTANT Resistant     PIP/TAZO 8 SENSITIVE Sensitive ug/mL    * RARE ESCHERICHIA COLI   Enterococcus faecalis - MIC*    AMPICILLIN <=2 SENSITIVE Sensitive     VANCOMYCIN  1 SENSITIVE Sensitive     GENTAMICIN  SYNERGY SENSITIVE Sensitive     * FEW ENTEROCOCCUS FAECALIS    Labs: Results for orders placed or performed during the hospital encounter of 04/10/24 (from the past 48 hours)  Glucose, capillary     Status: Abnormal   Collection Time: 05/19/24  7:46 AM  Result Value Ref Range   Glucose-Capillary 112 (H) 70 - 99 mg/dL    Comment: Glucose reference range applies only to samples taken after fasting for at least 8 hours.  Glucose, capillary     Status: Abnormal   Collection Time: 05/19/24 12:45 PM  Result Value Ref Range    Glucose-Capillary 134 (H) 70 - 99 mg/dL    Comment: Glucose reference range applies only to samples taken after fasting for at least 8 hours.  Glucose, capillary     Status: Abnormal   Collection Time: 05/19/24  3:50 PM  Result Value Ref Range   Glucose-Capillary 212 (H) 70 - 99 mg/dL    Comment: Glucose reference range applies only to samples taken after fasting for at least 8 hours.  Glucose, capillary     Status: Abnormal   Collection Time: 05/19/24  7:41 PM  Result Value Ref Range   Glucose-Capillary 102 (H) 70 - 99 mg/dL    Comment: Glucose reference range applies only to samples taken after fasting for at least 8 hours.  Glucose, capillary     Status: Abnormal   Collection Time: 05/19/24 11:58 PM  Result Value Ref Range   Glucose-Capillary 104 (H) 70 - 99 mg/dL    Comment: Glucose reference range applies only to samples taken after fasting for at least 8 hours.  Glucose, capillary     Status: Abnormal   Collection Time: 05/20/24  3:30 AM  Result Value Ref Range   Glucose-Capillary 126 (H) 70 - 99 mg/dL    Comment: Glucose reference range applies only to samples taken after fasting for at least 8 hours.  Renal function panel     Status: Abnormal   Collection Time: 05/20/24  3:54 AM  Result Value Ref Range   Sodium 127 (L) 135 - 145 mmol/L   Potassium 3.8 3.5 - 5.1 mmol/L   Chloride 99 98 - 111 mmol/L   CO2 17 (L) 22 - 32 mmol/L   Glucose, Bld 104 (H) 70 - 99 mg/dL    Comment: Glucose reference range applies only to samples taken after fasting for at least 8 hours.   BUN 69 (H) 8 - 23 mg/dL   Creatinine, Ser 8.05 (H) 0.61 - 1.24 mg/dL   Calcium  10.0 8.9 - 10.3 mg/dL   Phosphorus 4.8 (H) 2.5 - 4.6 mg/dL   Albumin  3.2 (L) 3.5 - 5.0 g/dL   GFR, Estimated 36 (L) >60 mL/min    Comment: (NOTE) Calculated using the CKD-EPI Creatinine Equation (2021)    Anion gap 11 5 -  15    Comment: Performed at [facility], [address], [place], [place] [address]  Glucose,  capillary     Status: Abnormal   Collection Time: 05/20/24  7:51 AM  Result Value Ref Range   Glucose-Capillary 111 (H) 70 - 99 mg/dL    Comment: Glucose reference range applies only to samples taken after fasting for at least 8 hours.  CBC with Differential/Platelet     Status: Abnormal   Collection Time: 05/20/24  8:45 AM  Result Value Ref Range   WBC 10.3 4.0 - 10.5 K/uL   RBC 4.49 4.22 - 5.81 MIL/uL   Hemoglobin 13.7 13.0 - 17.0 g/dL   HCT 59.8 60.9 - 47.9 %   MCV 89.3 80.0 - 100.0 fL   MCH 30.5 26.0 - 34.0 pg   MCHC 34.2 30.0 - 36.0 g/dL   RDW 83.5 (H) 88.4 - 84.4 %   Platelets 316 150 - 400 K/uL   nRBC 0.0 0.0 - 0.2 %   Neutrophils Relative % 63 %   Neutro Abs 6.6 1.7 - 7.7 K/uL   Lymphocytes Relative 16 %   Lymphs Abs 1.6 0.7 - 4.0 K/uL   Monocytes Relative 14 %   Monocytes Absolute 1.4 (H) 0.1 - 1.0 K/uL   Eosinophils Relative 4 %   Eosinophils Absolute 0.4 0.0 - 0.5 K/uL   Basophils Relative 1 %   Basophils Absolute 0.1 0.0 - 0.1 K/uL   Immature Granulocytes 2 %   Abs Immature Granulocytes 0.19 (H) 0.00 - 0.07 K/uL    Comment: Performed at [facility], [address], [place], [place] [address]  Glucose, capillary     Status: Abnormal   Collection Time: 05/20/24 11:42 AM  Result Value Ref Range   Glucose-Capillary 128 (H) 70 - 99 mg/dL    Comment: Glucose reference range applies only to samples taken after fasting for at least 8 hours.  Glucose, capillary     Status: Abnormal   Collection Time: 05/20/24  4:44 PM  Result Value Ref Range   Glucose-Capillary 172 (H) 70 - 99 mg/dL    Comment: Glucose reference range applies only to samples taken after fasting for at least 8 hours.  Glucose, capillary     Status: Abnormal   Collection Time: 05/20/24  8:16 PM  Result Value Ref Range   Glucose-Capillary 137 (H) 70 - 99 mg/dL    Comment: Glucose reference range applies only to samples taken after fasting for at least 8 hours.  Glucose, capillary     Status:  Abnormal   Collection Time: 05/20/24 11:19 PM  Result Value Ref Range   Glucose-Capillary 114 (H) 70 - 99 mg/dL    Comment: Glucose reference range applies only to samples taken after fasting for at least 8 hours.  Glucose, capillary     Status: Abnormal   Collection Time: 05/21/24  2:58 AM  Result Value Ref Range   Glucose-Capillary 130 (H) 70 - 99 mg/dL    Comment: Glucose reference range applies only to samples taken after fasting for at least 8 hours.    Imaging / Studies: No results found.       Medications / Allergies: per chart  Antibiotics: Anti-infectives (From admission, onward)    Start     Dose/Rate Route Frequency Ordered Stop   05/06/24 1400  piperacillin -tazobactam (ZOSYN ) IVPB 3.375 g        3.375 g 12.5 mL/hr over 240 Minutes Intravenous Every 8 hours  05/06/24 1227 05/19/24 0900   05/01/24 1400  piperacillin -tazobactam (ZOSYN ) IVPB 2.25 g  Status:  Discontinued        2.25 g 100 mL/hr over 30 Minutes Intravenous Every 8 hours 05/01/24 0957 05/06/24 1227   05/01/24 1045  micafungin  (MYCAMINE ) 100 mg in sodium chloride  0.9 % 100 mL IVPB        100 mg 105 mL/hr over 1 Hours Intravenous Every 24 hours 05/01/24 0949 05/18/24 1651   04/29/24 1000  micafungin  (MYCAMINE ) 150 mg in sodium chloride  0.9 % 100 mL IVPB  Status:  Discontinued        150 mg 107.5 mL/hr over 1 Hours Intravenous Every 24 hours 04/28/24 1036 05/01/24 0949   04/28/24 1345  micafungin  (MYCAMINE ) 100 mg in sodium chloride  0.9 % 100 mL IVPB        100 mg 105 mL/hr over 1 Hours Intravenous  Once 04/28/24 1245 04/28/24 1354   04/28/24 1000  micafungin  (MYCAMINE ) 100 mg in sodium chloride  0.9 % 100 mL IVPB  Status:  Discontinued        100 mg 105 mL/hr over 1 Hours Intravenous Every 24 hours 04/27/24 1219 04/28/24 1036   04/21/24 1400  clindamycin  (CLEOCIN ) 900 mg, gentamicin  (GARAMYCIN ) 240 mg in sodium chloride  0.9 % 1,000 mL for intraperitoneal lavage  Status:  Discontinued         Irrigation  To Surgery 04/21/24 1346 04/21/24 1618   04/20/24 1400  piperacillin -tazobactam (ZOSYN ) IVPB 3.375 g  Status:  Discontinued        3.375 g 12.5 mL/hr over 240 Minutes Intravenous Every 8 hours 04/20/24 0755 05/01/24 0957   04/20/24 1000  metroNIDAZOLE  (FLAGYL ) IVPB 500 mg  Status:  Discontinued        500 mg 100 mL/hr over 60 Minutes Intravenous Every 12 hours 04/20/24 0320 04/20/24 0752   04/20/24 1000  micafungin  (MYCAMINE ) 150 mg in sodium chloride  0.9 % 100 mL IVPB  Status:  Discontinued        150 mg 107.5 mL/hr over 1 Hours Intravenous Every 24 hours 04/20/24 0752 04/27/24 1219   04/20/24 0245  ceFEPIme  (MAXIPIME ) 2 g in sodium chloride  0.9 % 100 mL IVPB  Status:  Discontinued        2 g 200 mL/hr over 30 Minutes Intravenous Every 8 hours 04/20/24 0232 04/20/24 0752   04/20/24 0100  metroNIDAZOLE  (FLAGYL ) IVPB 500 mg        500 mg 100 mL/hr over 60 Minutes Intravenous On call to O.R. 04/20/24 0004 04/20/24 0100   04/14/24 1800  ceFEPIme  (MAXIPIME ) 2 g in sodium chloride  0.9 % 100 mL IVPB        2 g 200 mL/hr over 30 Minutes Intravenous Every 8 hours 04/14/24 1003 04/19/24 0957   04/14/24 1600  vancomycin  (VANCOCIN ) IVPB 1000 mg/200 mL premix  Status:  Discontinued        1,000 mg 200 mL/hr over 60 Minutes Intravenous Every 24 hours 04/13/24 1420 04/14/24 1000   04/14/24 1600  vancomycin  (VANCOREADY) IVPB 1500 mg/300 mL  Status:  Discontinued        1,500 mg 150 mL/hr over 120 Minutes Intravenous Every 24 hours 04/14/24 1002 04/16/24 0744   04/14/24 1200  vancomycin  (VANCOCIN ) IVPB 1000 mg/200 mL premix  Status:  Discontinued        1,000 mg 200 mL/hr over 60 Minutes Intravenous Every 24 hours 04/13/24 1053 04/13/24 1109   04/13/24 2200  ceFEPIme  (MAXIPIME ) 2 g in sodium  chloride 0.9 % 100 mL IVPB  Status:  Discontinued        2 g 200 mL/hr over 30 Minutes Intravenous Every 12 hours 04/13/24 1036 04/13/24 1109   04/13/24 2200  ceFEPIme  (MAXIPIME ) 2 g in sodium chloride  0.9 % 100  mL IVPB  Status:  Discontinued        2 g 200 mL/hr over 30 Minutes Intravenous Every 12 hours 04/13/24 1425 04/14/24 1003   04/13/24 1500  vancomycin  (VANCOCIN ) 2,000 mg in sodium chloride  0.9 % 500 mL IVPB        2,000 mg 260 mL/hr over 120 Minutes Intravenous  Once 04/13/24 1409 04/13/24 1850   04/13/24 1500  ceFEPIme  (MAXIPIME ) 2 g in sodium chloride  0.9 % 100 mL IVPB  Status:  Discontinued        2 g 200 mL/hr over 30 Minutes Intravenous Every 12 hours 04/13/24 1420 04/13/24 1425   04/13/24 1500  metroNIDAZOLE  (FLAGYL ) IVPB 500 mg  Status:  Discontinued        500 mg 100 mL/hr over 60 Minutes Intravenous Every 12 hours 04/13/24 1420 04/17/24 0725   04/13/24 1200  vancomycin  (VANCOCIN ) 2,000 mg in sodium chloride  0.9 % 500 mL IVPB  Status:  Discontinued        2,000 mg 260 mL/hr over 120 Minutes Intravenous  Once 04/13/24 1052 04/13/24 1121   04/13/24 1100  metroNIDAZOLE  (FLAGYL ) IVPB 500 mg  Status:  Discontinued        500 mg 100 mL/hr over 60 Minutes Intravenous 2 times daily 04/13/24 1007 04/13/24 1109   04/13/24 1100  vancomycin  (VANCOCIN ) IVPB 1000 mg/200 mL premix  Status:  Discontinued        1,000 mg 200 mL/hr over 60 Minutes Intravenous  Once 04/13/24 1007 04/13/24 1020   04/13/24 1015  ceFEPIme  (MAXIPIME ) 2 g in sodium chloride  0.9 % 100 mL IVPB        2 g 200 mL/hr over 30 Minutes Intravenous STAT 04/13/24 1007 04/14/24 1721   04/10/24 2200  ceFAZolin  (ANCEF ) IVPB 2g/100 mL premix        2 g 200 mL/hr over 30 Minutes Intravenous Every 8 hours 04/10/24 1833 04/11/24 0531   04/10/24 0600  ceFAZolin  (ANCEF ) IVPB 2g/100 mL premix        2 g 200 mL/hr over 30 Minutes Intravenous On call to O.R. 04/10/24 0533 04/10/24 1526         Note: Portions of this report may have been transcribed using voice recognition software. Every effort was made to ensure accuracy; however, inadvertent computerized transcription errors may be present.   Any transcriptional errors that result  from this process are unintentional.    Elspeth KYM Schultze, MD, FACS, MASCRS Esophageal, Gastrointestinal & Colorectal Surgery Robotic and Minimally Invasive Surgery  [facility] [address] [place], [place] [address] [phone] Fax [phone] Main  CONTACT INFORMATION: Weekday (9AM-5PM): Call [facility] main office at [phone] Weeknight (5PM-9AM) or Weekend/Holiday: Check EPIC Web Links tab & use AMION (password  [email or web address]) for General Surgery [facility] coverage  Please, DO NOT use SecureChat  (it is not reliable communication to reach operating surgeons & will lead to a delay in care).   [employer]pic staff messaging available for outptient concerns needing 1-2 business day response.      05/21/2024  6:25 AM

## 2024-05-22 DIAGNOSIS — K219 Gastro-esophageal reflux disease without esophagitis: Secondary | ICD-10-CM | POA: Diagnosis not present

## 2024-05-22 DIAGNOSIS — K631 Perforation of intestine (nontraumatic): Secondary | ICD-10-CM | POA: Diagnosis not present

## 2024-05-22 DIAGNOSIS — C679 Malignant neoplasm of bladder, unspecified: Secondary | ICD-10-CM | POA: Diagnosis not present

## 2024-05-22 DIAGNOSIS — Z9889 Other specified postprocedural states: Secondary | ICD-10-CM | POA: Diagnosis not present

## 2024-05-22 LAB — CBC WITH DIFFERENTIAL/PLATELET
Abs Immature Granulocytes: 0.15 K/uL — ABNORMAL HIGH (ref 0.00–0.07)
Basophils Absolute: 0.2 K/uL — ABNORMAL HIGH (ref 0.0–0.1)
Basophils Relative: 2 %
Eosinophils Absolute: 0.4 K/uL (ref 0.0–0.5)
Eosinophils Relative: 4 %
HCT: 40.9 % (ref 39.0–52.0)
Hemoglobin: 13.9 g/dL (ref 13.0–17.0)
Immature Granulocytes: 2 %
Lymphocytes Relative: 18 %
Lymphs Abs: 1.8 K/uL (ref 0.7–4.0)
MCH: 30.2 pg (ref 26.0–34.0)
MCHC: 34 g/dL (ref 30.0–36.0)
MCV: 88.7 fL (ref 80.0–100.0)
Monocytes Absolute: 1.5 K/uL — ABNORMAL HIGH (ref 0.1–1.0)
Monocytes Relative: 15 %
Neutro Abs: 6 K/uL (ref 1.7–7.7)
Neutrophils Relative %: 59 %
Platelets: 326 K/uL (ref 150–400)
RBC: 4.61 MIL/uL (ref 4.22–5.81)
RDW: 15.7 % — ABNORMAL HIGH (ref 11.5–15.5)
WBC: 10.1 K/uL (ref 4.0–10.5)
nRBC: 0 % (ref 0.0–0.2)

## 2024-05-22 LAB — COMPREHENSIVE METABOLIC PANEL WITH GFR
ALT: 98 U/L — ABNORMAL HIGH (ref 0–44)
AST: 54 U/L — ABNORMAL HIGH (ref 15–41)
Albumin: 3.3 g/dL — ABNORMAL LOW (ref 3.5–5.0)
Alkaline Phosphatase: 89 U/L (ref 38–126)
Anion gap: 13 (ref 5–15)
BUN: 91 mg/dL — ABNORMAL HIGH (ref 8–23)
CO2: 17 mmol/L — ABNORMAL LOW (ref 22–32)
Calcium: 10.2 mg/dL (ref 8.9–10.3)
Chloride: 97 mmol/L — ABNORMAL LOW (ref 98–111)
Creatinine, Ser: 2.09 mg/dL — ABNORMAL HIGH (ref 0.61–1.24)
GFR, Estimated: 33 mL/min — ABNORMAL LOW (ref >60)
Glucose, Bld: 108 mg/dL — ABNORMAL HIGH (ref 70–99)
Potassium: 3.9 mmol/L (ref 3.5–5.1)
Sodium: 127 mmol/L — ABNORMAL LOW (ref 135–145)
Total Bilirubin: 0.8 mg/dL (ref 0.0–1.2)
Total Protein: 7.2 g/dL (ref 6.5–8.1)

## 2024-05-22 LAB — GLUCOSE, CAPILLARY
Glucose-Capillary: 103 mg/dL — ABNORMAL HIGH (ref 70–99)
Glucose-Capillary: 145 mg/dL — ABNORMAL HIGH (ref 70–99)
Glucose-Capillary: 120 mg/dL — ABNORMAL HIGH (ref 70–99)

## 2024-05-22 LAB — PHOSPHORUS: Phosphorus: 5.4 mg/dL — ABNORMAL HIGH (ref 2.5–4.6)

## 2024-05-22 LAB — MAGNESIUM: Magnesium: 2.5 mg/dL — ABNORMAL HIGH (ref 1.7–2.4)

## 2024-05-22 MED ORDER — ENSURE PLUS HIGH PROTEIN PO LIQD: 237.0000 mL | Freq: Two times a day (BID) | ORAL | Status: DC

## 2024-05-22 MED ORDER — SALINE SPRAY 0.65 % NA SOLN: 1.0000 | Freq: Four times a day (QID) | NASAL | Status: DC | PRN

## 2024-05-22 MED ORDER — OXYCODONE-ACETAMINOPHEN 5-325 MG PO TABS: 1.0000 | ORAL_TABLET | Freq: Four times a day (QID) | ORAL | 0 refills | Status: DC | PRN

## 2024-05-22 MED ORDER — ONDANSETRON HCL 4 MG PO TABS: 4.0000 mg | ORAL_TABLET | Freq: Every day | ORAL | 1 refills | Status: DC | PRN

## 2024-05-22 MED ORDER — LOPERAMIDE HCL 2 MG PO CAPS
2.0000 mg | ORAL_CAPSULE | Freq: Two times a day (BID) | ORAL | 0 refills | Status: DC
Start: 2024-05-22 — End: 2024-05-27

## 2024-05-22 MED ORDER — PROSOURCE PLUS PO LIQD: 30.0000 mL | Freq: Two times a day (BID) | ORAL | Status: DC

## 2024-05-22 MED ORDER — JUVEN PO PACK: 1.0000 | PACK | Freq: Two times a day (BID) | ORAL | Status: DC

## 2024-05-22 MED ORDER — HYDROXYZINE HCL 25 MG PO TABS
25.0000 mg | ORAL_TABLET | Freq: Three times a day (TID) | ORAL | Status: DC | PRN
Start: 2024-05-22 — End: 2024-08-13

## 2024-05-22 NOTE — TOC Transition Note (Addendum)
 Transition of Care [facility]) - Discharge Note   Patient Details  Name: Kristopher Thompson MRN: [id number] Date of Birth: 1951/01/18  [facility] CM/SW Contact:  Montie LOISE Louder, LCSW Phone Number: 05/22/2024, 4:38 PM   Clinical Narrative:      Patient will Discharge to: [facility]  Discharge Date: 05/22/24 Family Notified:  Transport By: ROME  Per MD patient is ready for discharge. RN, patient, and facility notified of discharge. Discharge Summary sent to facility. RN given number for [phone]. Ambulance transport requested for patient.   Clinical Social Worker signing off.  Montie Louder, MSW, LCSW Clinical Social Worker      Barriers to Discharge: Continued Medical Work up   Patient Goals and CMS Choice Patient states their goals for this hospitalization and ongoing recovery are:: home CMS Medicare.gov Compare Post Acute Care list provided to:: Patient Represenative (must comment) Choice offered to / list presented to : Spouse [place] ownership interest in [facility].provided to:: Patient    Discharge Placement                       Discharge Plan and Services Additional resources added to the After Visit Summary for   In-house Referral: Clinical Social Work Discharge Planning Services: CM Consult Post Acute Care Choice: Skilled Nursing Facility                               Social Drivers of Health (SDOH) Interventions SDOH Screenings   Food Insecurity: No Food Insecurity (04/12/2024)  Housing: Low Risk  (04/12/2024)  Transportation Needs: No Transportation Needs (04/12/2024)  Utilities: Not At Risk (04/12/2024)  Social Connections: Socially Integrated (04/10/2024)  Tobacco Use: Medium Risk (04/10/2024)     Readmission Risk Interventions    04/12/2024    4:58 PM  Readmission Risk Prevention Plan  Transportation Screening Complete  PCP or Specialist Appt within 3-5 Days Complete  HRI or Home  Care Consult Complete  Social Work Consult for Recovery Care Planning/Counseling Complete  Palliative Care Screening Not Applicable  Medication Review Oceanographer) Complete

## 2024-05-22 NOTE — Discharge Summary (Signed)
 Physician Discharge Summary   Patient: Kristopher Thompson MRN: [id number] DOB: [birth year]  Admit date:     04/10/2024  Discharge date: 05/22/24  Discharge Physician: Alejandro Marker, DO   PCP: [name], MD   Recommendations at discharge:   Follow-up with PCP within 1 to 2 weeks and repeat CBC, CMP, mag, Phos within 1 week Follow-up with general surgery in outpatient setting within 1 to 2 weeks continue all drain and drain wound care Follow-up with urology in outpatient setting within 1 to 2 weeks  Discharge Diagnoses: Principal Problem:   Delayed bowel perforation s/p SBR/end ileostomy Active Problems:   S/P ileal conduit (HCC)   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 08/08/2017   GERD (gastroesophageal reflux disease)   Obesity (BMI 35.0-39.9 without comorbidity)   Prolonged QT interval   Non-recurrent bilateral inguinal hernia without obstruction or gangrene   Incarcerated incisional hernia   Sinus tachycardia   Tachypnea   Acute respiratory insufficiency, postoperative   Sepsis due to undetermined organism (HCC)   Lactic acidosis   Class 2 obesity   Chronic anticoagulation   Hearing loss   History of bladder cancer   Obstructive sleep apnea of adult   Parastomal hernia of ileal conduit   Partial small bowel obstruction (HCC)   Personal history of PE (pulmonary embolism)   Pressure injury of skin   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 *  Hospital Course: Kristopher Thompson is a 73 y.o. male with past medical history significant for HTN, chronic diastolic congestive heart failure, CAD, DM2, pulmonary embolism on Eliquis , anemia, obesity, prostate cancer, bladder cancer who was initially admitted by the general surgery service on 5/30 for operative management regarding parastomal and incisional incarcerated  abdominal wall hernias; requiring extensive lysis, mesh insertion, repair, urostomy ileal conduit revision thereafter admitted to the ICU due to tachycardia and tachypnea. Initially placed on norepinephrine  and an arterial line was placed. Patient declined NG tube placement but eventually agreed. Patient was taken back to the OR on 6/9 due to perforated bowel and postop shock. Required multiple pressors, steroids and PRBC transfusion after washout/closure in the OR. On 6/11 developed AKI requiring dialysis.. Patient was extubated on 6/14.    Significant hospital events/procedures: 5/30: Admit to general surgery, robotic LOA, incisional/parastomal, left inguinal incarcerated hernia repair with mesh, urostomy ileal conduit revision, Dr. Sheldon 6/2: CCM consult and vasopressor 6/4: off NE. Pulled out his NGT refused replacement. After multiple emesis  6/5: NG tube replaced. 6/6: transferred to MedSurg. 6/9: OR for perforated bowel, drainage from right abdominal wall abscess, intra-abdominal abscess, small bowel resection, wound VAC; Dr. Tanda 6/10: NE, Phenylephrine , vasopressin , steroids, 1u PRBCs, back to OR for LOA, abdominal wall debridement, ileal resection, end ileostomy abdominal wall hernia repairs with mesh, fascial closure, wound VAC, Dr. Sheldon 6/11: Worsening AKI with fluid overload, start CRRT, decreasing pressor requirements 6/14: Off CRRT, arousable.  Extubated. 6/15: transferred to [facility] for iHD; montoring for renal recovery 6/16: iHD, severely encephalopathic with high BUN 6/17: restarted CRRT for to ongoing uremia and encephalopathy 6/18: was awake enough to work with PT, OT 6/23: bilateral PCN placed by interventional radiology, Dr. Jennefer 6/24: MS imrpoving, Neph tubes in and draining 6/30: bilateral nephrostomy tube exchange by IR 7/2: Right percutaneous nephrostomy exchange with ureteral stent by IR, cortrak removed 7/7: Oral intake increased,  completed micafungin /Zosyn   7/11: He  is medically stable for discharge and has been cleared by both general surgery and urology.  PICC line will be removed.   Assessment & Plan:  Principal Problem:   Delayed bowel perforation s/p SBR/end ileostomy Active Problems:   S/P ileal conduit (HCC)   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 08/08/2017   GERD (gastroesophageal reflux disease)   Obesity (BMI 35.0-39.9 without comorbidity)   Prolonged QT interval   Non-recurrent bilateral inguinal hernia without obstruction or gangrene   Incarcerated incisional hernia   Sinus tachycardia   Tachypnea   Acute respiratory insufficiency, postoperative   Sepsis due to undetermined organism (HCC)   Lactic acidosis   Class 2 obesity   Chronic anticoagulation   Hearing loss   History of bladder cancer   Obstructive sleep apnea of adult   Parastomal hernia of ileal conduit   Partial small bowel obstruction (HCC)   Personal history of PE (pulmonary embolism)   Pressure injury of skin   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 Sheldon on 04/10/2024.  Postoperatively patient required vasopressors and was transferred to the ICU.  Subsequent return to the OR on 04/20/2024 for bowel perforation underwent I&D of abdominal wall/intra-abdominal abscess with small bowel resection.  Returned to OR on 6/10 for further abdominal wall debridement, LOA, ileal resection, end ileostomy with abdominal wall hernia repairs with mesh, fascial closure with wound VAC.  Seen by infectious disease, Dr. Dea; with recommendation of  4-6-week course of Zosyn  and micafungin  treatment given mesh placed in a contaminated field with completion of treatment on 05/18/2024. -Surgery following, appreciate assistance -Wound VAC changes twice weekly Monday/Thursday -Further per General Surgery diet was changed to regular diet.  Given that he continues have ileostomy output bowel regimen has been initiated and has been given soluble Polly fiber/FiberCon to thicken and also the general surgery team and added loperamide  to help constipate the patient - Anticipate discharging to SNF now that he is medically stable and cleared from a surgical perspective from both urology and general surgery.   Acute Renal Failure secondary to ischemic ATN/Shock Bilateral Hydronephrosis/Obstruction -Urology following, appreciate assistance -Cr (0.97 04/01/2024). BUN/Cr Trend: Recent Labs  Lab 05/18/24 0207 05/18/24 0600 05/19/24 0600 05/20/24 0354 05/21/24 0650 05/21/24 0651 05/22/24 0630  BUN 57* 73* 62* 69* 85* 84* 91*  CREATININE 1.72* 2.08* 1.74* 1.94* 2.10* 2.14* 2.09*  -Avoid Nephrotoxic Medications, Contrast Dyes, Hypotension and Dehydration to Ensure Adequate Renal Perfusion and will need to Renally Adjust Meds -Continue to Monitor and Trend Renal Function carefully and repeat CMP in the AM  -Required CRRT followed by HD while inpatient, last HD 6/23; nephrology now signed off -S/p bilateral PCN on 6/23, exchanged 6/30; right exchanged 7/2  [name]: Improved.  Supportive Care. C/w Metaclopramide 5 mg TID, Ondansetron  4 mg q6hprn Nausea and Vomiting, and Prochlorperazine  10 mg IV q6hprn Refractory N/V.  He had a better day today and was tolerating food with mild nausea.  Nausea has essentially resolved   Urine leak w/ Hx prior radical cystectomy/ileal conduit urinary diversion: CT loopogram 7/1 with finding of urine leak identified  at inferior butt end of ileal conduit. Right percutaneous nephrostomy exchange with ureteral stent 7/2  by IR.  Further per Urology and Interventional Radiology.  Urology evaluated and they feel that he has remained clinically stable this week and recommending continued bilateral PCN drainage and abdominal drain.  They feel the outputs suggest decreasing drain output which is encouraging and they feel that the fluid in his abdominal drain is likely urine but going to check a creatinine level to confirm and was 2.0 that was consistent w/ Serum.  From urology standpoint they feel cautiously and leave all the drains and nephrostomy tubes in place upon discharge.  They still feel it is is okay for him to be discharged to SNF and they are planning to restudy radiologically as an outpatient  Type 2 Diabetes Mellitus: On metformin 500 mg p.o. daily outpatient.  Hemoglobin A1c 5.3 on 04/01/2024. C/w Semglee  25u [place] at bedtime and  Resistant SSI for coverage. CBGs ranging from 103-145   Acute Blood Loss Anemia Right Flank Hematoma Anemia of Chronic Medical Disease Anemia panel with iron  54, TIBC 281, ferritin 588 -Hgb/Hct Trend:  Recent Labs  Lab 05/14/24 0315 05/15/24 0610 05/16/24 0326 05/18/24 0600 05/20/24 0845 05/21/24 0651 05/22/24 0630  HGB 11.7* 11.7* 12.9* 13.1 13.7 13.8 13.9  HCT 37.1* 36.8* 40.3 39.0 40.1 41.4 40.9  MCV 93.9 92.5 93.1 90.9 89.3 89.2 88.7  -CTM for S/Sx of Bleeding/No overt bleeding noted. Repeat CBC within 1 week   Chronic Diastolic Congestive Heart Failure Essential HTN -C/w Hydralazine  20 mg IV every 4 hours as needed SBP greater than 160. CTM BP per Protocol. Last BP reading was 100/66   HLD: C/w Rosuvastatin  10 mg p.o. nightly   History of Pulmonary Embolism: C/w Eliquis  5 mg p.o. twice daily   Weakness/ability/deconditioning:  PT/OT initially recommended CIR but now recommending SNF. Continue therapy efforts while inpatient. Has bed @ [facility]  Hypoalbuminemia: Patient's Albumin  Level ranging from 3.0-3.3. (3.3 today) CTM and Trend and repeat CMP in the  AM  Moderate Protein Calorie Malnutrition: Nutrition Status:  Nutrition Problem: Increased nutrient needs Etiology: acute illness Signs/Symptoms: estimated needs Interventions: Tube feeding, Prostat, Juven  Class I Obesity: Complicates overall prognosis and care. Estimated body mass index is 30.53 kg/m as calculated from the following:   Height as of this encounter: 5' 11 (1.803 m).   Weight as of this encounter: 99.3 kg. Weight Loss and Dietary Counseling given  Nutrition Documentation    Flowsheet Row Admission (Current) from 04/10/2024 in [facility]  Nutrition Problem Increased nutrient needs  Etiology acute illness  Nutrition Goal Patient will meet greater than or equal to 90% of their needs  Interventions Tube feeding, Prostat, Juven  ,  Active Pressure Injury/Wound(s)     None          Consultants:  General Surgery Urology Interventional Radiology Infectious Diseases Nephrology PCCM  Procedures performed: As delineated as above   Disposition: Skilled nursing facility  Diet recommendation:  Discharge Diet Orders (From admission, onward)     Start     Ordered   05/22/24 0000  Diet - low sodium heart healthy        05/22/24 1243           Regular diet DISCHARGE MEDICATION: Allergies as of 05/22/2024       Reactions   Demerol  [meperidine ] Nausea And Vomiting, Other (See Comments)   SEVERE NAUSEA        Medication  List     TAKE these medications    (feeding supplement) PROSource Plus liquid Take 30 mLs by mouth 2 (two) times daily between meals.   feeding supplement Liqd Take 237 mLs by mouth 2 (two) times daily between meals.   nutrition supplement (JUVEN) Pack Take 1 packet by mouth 2 (two) times daily between meals.   acetaminophen  500 MG tablet Commonly known as: TYLENOL  Take 1,000 mg by mouth every 8 (eight) hours as needed for mild pain or headache.   bisacodyl  5 MG EC tablet Commonly known as:  DULCOLAX Take 2 tablets (10 mg total) by mouth daily as needed for moderate constipation. What changed:  how much to take when to take this   Centrum Minis Adults 50+ Tabs Take 1 tablet by mouth See admin instructions. Take 1 tablet by mouth twice daily on every other day.   cyclobenzaprine  5 MG tablet Commonly known as: FLEXERIL  Take 1 tablet (5 mg total) by mouth 3 (three) times daily as needed for muscle spasms.   D-MANNOSE PO Take 2-3 tablets by mouth See admin instructions. Take 2 tablets by mouth every morning & take 1 tablet by mouth at bedtime.   diphenhydrAMINE  25 mg capsule Commonly known as: BENADRYL  Take 25 mg by mouth every other day as needed (congestion/allergies (every other night)).   Eliquis  5 MG Tabs tablet Generic drug: apixaban  Take 5 mg by mouth 2 (two) times daily.   FISH OIL PO Take 1 capsule by mouth 3 (three) times a week.   hydrOXYzine  25 MG tablet Commonly known as: ATARAX  Take 1 tablet (25 mg total) by mouth 3 (three) times daily as needed for anxiety.   ipratropium 0.03 % nasal spray Commonly known as: ATROVENT  Place 1 spray into both nostrils 3 (three) times daily as needed (congestion).   loperamide  2 MG capsule Commonly known as: IMODIUM  Take 1 capsule (2 mg total) by mouth 2 (two) times daily.   metFORMIN 500 MG tablet Commonly known as: GLUCOPHAGE Take 500 mg by mouth in the morning.   metoCLOPramide  5 MG tablet Commonly known as: REGLAN  Take 1 tablet (5 mg total) by mouth every 8 (eight) hours as needed for nausea.   MiraLax  17 g packet Generic drug: polyethylene glycol Take 17 g by mouth daily as needed (constipation.).   ondansetron  4 MG tablet Commonly known as: Zofran  Take 1 tablet (4 mg total) by mouth daily as needed for nausea or vomiting.   oxyCODONE -acetaminophen  5-325 MG tablet Commonly known as: Percocet Take 1 tablet by mouth every 6 (six) hours as needed for severe pain (pain score 7-10). Can increase to 2 pills  at a time if needed   polycarbophil 625 MG tablet Commonly known as: FIBERCON Take 1 tablet (625 mg total) by mouth 2 (two) times daily.   rosuvastatin  10 MG tablet Commonly known as: CRESTOR  Take 10 mg by mouth every evening.   sodium chloride  0.65 % Soln nasal spray Commonly known as: OCEAN Place 1-2 sprays into both nostrils every 6 (six) hours as needed for congestion (dry nose).   triamcinolone  cream 0.1 % Commonly known as: KENALOG  Apply 1 application topically daily as needed.   VITAMIN B-12 PO Place 1 tablet under the tongue 3 (three) times a week.               Durable Medical Equipment  (From admission, onward)           Start     Ordered   05/07/24  1137  For home use only DME Walker rolling  Once       Question Answer Comment  Walker: With 5 Inch Wheels   Patient needs a walker to treat with the following condition Balance problem      05/07/24 1136   05/07/24 1137  For home use only DME 3 n 1  Once        05/07/24 1136              Discharge Care Instructions  (From admission, onward)           Start     Ordered   05/22/24 0000  Discharge wound care:       Comments: 00    Negative pressure wound therapy  Every Mon-Wed-Fri 1000    Comments: WOC to change M/TH Question Answer Comment Amount of suction? 125 mm/Hg   Suction Type? Continuous     05/08/24 0904   05/22/24 1243            Follow-up Information     [name], MD Follow up in 1 month(s).   Specialties: General Surgery, Colon and Rectal Surgery Why: To follow up after your operation, To have your wound re-checked Contact information: [address] [place] [address] [phone]         [name], MD Follow up in 2 week(s).   Specialty: Urology Why: to follow up on your drains (nephrostomy, stent, abdominal wall drains) controlling the urine leak Contact information: [address] [place] [address] [phone]                 Discharge Exam: Filed Weights   05/04/24 2200 05/06/24 0500 05/07/24 0500  Weight: 44.9 kg 100.4 kg 99.3 kg   Vitals:   05/22/24 1130 05/22/24 1135  BP: (!) 89/72 100/66  Pulse: (!) 105 (!) 103  Resp: 19   Temp: (!) 97.3 F (36.3 C)   SpO2: 97% 96%   Examination: Physical Exam:  Constitutional: Chronically ill-appearing Caucasian male in no acute distress appears calm Respiratory: Diminished to auscultation bilaterally, no wheezing, rales, rhonchi or crackles. Normal respiratory effort and patient is not tachypenic. No accessory muscle use.  Unlabored breathing Cardiovascular: RRR, no murmurs / rubs / gallops. S1 and S2 auscultated. No extremity edema. Abdomen: Soft, non-tender, distended secondary to body habitus.  Has a right lower quadrant drain/urostomy in right upper quadrant ileostomy and a midline wound incision connected to wound VAC.  Bowel sounds positive.  GU: Deferred.  Urostomy tube and nephrostomy tubes noted Musculoskeletal: No clubbing / cyanosis of digits/nails. No joint deformity upper and lower extremities. Skin: No rashes, lesions, ulcers on a limited skin evaluation.  No induration; Warm and dry.  Neurologic: CN 2-12 grossly intact with no focal deficits.  Romberg sign cerebellar reflexes not assessed.  Psychiatric: Normal judgment and insight. Alert and oriented x 3.   Condition at discharge: stable  The results of significant diagnostics from this hospitalization (including imaging, microbiology, ancillary and laboratory) are listed below for reference.   Imaging Studies: IR URETERAL STENT RIGHT NEW ACCESS W/SEP NEPHROSTOMY CATH Result Date: 05/14/2024 INDICATION: Patient with a history of a radical cystectomy/ileal conduit urinary diversion with a known leak of the ileal conduit. Planned retrograde placement of a ureteral stent and replacement of the percutaneous nephrostomy tube for urinary diversion away from the leak. EXAM: Nephrostomy tube  exchange, antegrade ureterogram with fluoroscopy, new placement retrograde ureteral stent COMPARISON:  None  Available. MEDICATIONS: None; The antibiotic was administered in an appropriate time frame prior to skin puncture. ANESTHESIA/SEDATION: Moderate (conscious) sedation was employed during this procedure. A total of Versed  4 mg and Fentanyl  200 mcg was administered intravenously by the radiology nurse. Total intra-service moderate Sedation Time: 44 minutes. The patient's level of consciousness and vital signs were monitored continuously by radiology nursing throughout the procedure under my direct supervision. CONTRAST:  35 mL Omnipaque  300-administered into the collecting system(s) FLUOROSCOPY: Radiation Exposure Index (as provided by the fluoroscopic device): 21 minutes 12 seconds 3,401 mGy Kerma COMPLICATIONS: None immediate. PROCEDURE: Informed written consent was obtained from the patient after a thorough discussion of the procedural risks, benefits and alternatives. All questions were addressed. Maximal Sterile Barrier Technique was utilized including caps, mask, sterile gowns, sterile gloves, sterile drape, hand hygiene and skin antiseptic. A timeout was performed prior to the initiation of the procedure. With patient in a left lateral decubitus position the right lower quadrant and right flank were prepped and draped in the usual sterile fashion. Dilute contrast was injected into the pre-existing percutaneous nephrostomy tube in order to perform a pyelogram the and visualized anatomy as well as identifying location of the catheter. Once this was established, retention suture and sterile dressing were removed the catheter was cut. An 018 and an 035 guidewire were advanced under fluoroscopy into the renal pelvis. The catheter was then removed over a guidewire. The 018 safety wire was then clamped external to the patient in order to lock its position. An angled glide catheter was then advanced over the  guidewire and placed in the renal pelvis. And antegrade ureterogram was then performed by injecting the catheter in the renal pelvis and evaluating the right ureter. The ureter is dilated and the add leak near the anastomosis is identified. Using standard catheter and guidewire technique, the catheter and guidewire were advanced in antegrade fashion down the ureter past the leak and into the ileal conduit. Contrast was then injected into the ileum in order to identify anatomy and further promote advancing the catheter under video x-ray. Using a combination of AP and lateral imaging, fluoroscopy was then done to continue to advance the guidewire and catheter through the ileum and out the ostomy. Once the guidewire was identified out the ostium it was then removed in order to have further purchase at the ostomy. The catheter was then advanced over the guidewire and out the patient through the ostomy. The guidewire was then removed leaving the catheter in position and a new guidewire was placed retrograde through the ureter from the ostomy. This was done under video x-rays and monitor for when the guidewire was identified at the renal pelvis. A 10 French pigtail catheter was then advanced after the 035 catheter was removed, over the guidewire, in retrograde fashion, and coiled in the renal pelvis there by placing a retrograde ureteral stent. The locking pigtail was engaged and the catheter was left in the ostomy. The safety wire was then used to advance a new 10 [place] percutaneous nephrostomy tube over the guidewire and back in percutaneously to the renal pelvis. The catheter was then coiled in position and retention suture was placed. The percutaneous nephrostomy tube was then connected to gravity drainage. IMPRESSION: Fluoroscopic guided replacement of percutaneous nephrostomy tube as well as antegrade pyelogram and contrast injection for the ureter on the right side and subsequent placement of a new retrograde 10  French ureteral stent. The patient will return for routine exchanges of both catheters  to be scheduled for a later date. Electronically Signed   By: Cordella Banner   On: 05/14/2024 09:35   IR URETERAL STENT PLACEMENT EXISTING ACCESS LEFT Result Date: 05/14/2024 INDICATION: Patient with a history of a radical cystectomy/ileal conduit urinary diversion with a known leak of the ileal conduit. Planned retrograde placement of a ureteral stent and replacement of the percutaneous nephrostomy tube for urinary diversion away from the leak. EXAM: Nephrostomy tube exchange, antegrade ureterogram with fluoroscopy, new placement retrograde ureteral stent COMPARISON:  None Available. MEDICATIONS: None; The antibiotic was administered in an appropriate time frame prior to skin puncture. ANESTHESIA/SEDATION: Moderate (conscious) sedation was employed during this procedure. A total of Versed  4 mg and Fentanyl  200 mcg was administered intravenously by the radiology nurse. Total intra-service moderate Sedation Time: 44 minutes. The patient's level of consciousness and vital signs were monitored continuously by radiology nursing throughout the procedure under my direct supervision. CONTRAST:  35 mL Omnipaque  300-administered into the collecting system(s) FLUOROSCOPY: Radiation Exposure Index (as provided by the fluoroscopic device): 21 minutes 12 seconds 3,401 mGy Kerma COMPLICATIONS: None immediate. PROCEDURE: Informed written consent was obtained from the patient after a thorough discussion of the procedural risks, benefits and alternatives. All questions were addressed. Maximal Sterile Barrier Technique was utilized including caps, mask, sterile gowns, sterile gloves, sterile drape, hand hygiene and skin antiseptic. A timeout was performed prior to the initiation of the procedure. With patient in a left lateral decubitus position the right lower quadrant and right flank were prepped and draped in the usual sterile fashion.  Dilute contrast was injected into the pre-existing percutaneous nephrostomy tube in order to perform a pyelogram the and visualized anatomy as well as identifying location of the catheter. Once this was established, retention suture and sterile dressing were removed the catheter was cut. An 018 and an 035 guidewire were advanced under fluoroscopy into the renal pelvis. The catheter was then removed over a guidewire. The 018 safety wire was then clamped external to the patient in order to lock its position. An angled glide catheter was then advanced over the guidewire and placed in the renal pelvis. And antegrade ureterogram was then performed by injecting the catheter in the renal pelvis and evaluating the right ureter. The ureter is dilated and the add leak near the anastomosis is identified. Using standard catheter and guidewire technique, the catheter and guidewire were advanced in antegrade fashion down the ureter past the leak and into the ileal conduit. Contrast was then injected into the ileum in order to identify anatomy and further promote advancing the catheter under video x-ray. Using a combination of AP and lateral imaging, fluoroscopy was then done to continue to advance the guidewire and catheter through the ileum and out the ostomy. Once the guidewire was identified out the ostium it was then removed in order to have further purchase at the ostomy. The catheter was then advanced over the guidewire and out the patient through the ostomy. The guidewire was then removed leaving the catheter in position and a new guidewire was placed retrograde through the ureter from the ostomy. This was done under video x-rays and monitor for when the guidewire was identified at the renal pelvis. A 10 French pigtail catheter was then advanced after the 035 catheter was removed, over the guidewire, in retrograde fashion, and coiled in the renal pelvis there by placing a retrograde ureteral stent. The locking pigtail was  engaged and the catheter was left in the ostomy. The safety wire  was then used to advance a new 10 [place] percutaneous nephrostomy tube over the guidewire and back in percutaneously to the renal pelvis. The catheter was then coiled in position and retention suture was placed. The percutaneous nephrostomy tube was then connected to gravity drainage. IMPRESSION: Fluoroscopic guided replacement of percutaneous nephrostomy tube as well as antegrade pyelogram and contrast injection for the ureter on the right side and subsequent placement of a new retrograde 10 French ureteral stent. The patient will return for routine exchanges of both catheters to be scheduled for a later date. Electronically Signed   By: Cordella Banner   On: 05/14/2024 09:35   DG Loopogram Result Date: 05/12/2024 CLINICAL DATA:  [id number] Complication of Ileal conduit [facility]) [id number] Patient with ileal conduit, s/p recent nephrostogram with Dr. Luverne at time of nephrostomy exchange. Concern for conduit leak due to presence of urine in surgical drains. Team has requested introduction of contrast into the conduit to be followed by CT imaging. EXAM: WATER  SOLUBLE CONTRAST LOOPOGRAM TECHNIQUE: The urostomy bag was removed as his current device did not allow entry from port used to empty the collection bag. The skin was cleansed and a scout image was obtained. An 8 Fr Foley was inflated outside of the urostomy. The tip was inserted and the balloon used to gently occlude the stoma. Omni 300 was introduced through the catheter. This technique was not adequate to prevent leakage. After discussion with Dr. Hughes, the 8 Fr Foley was introduced several cm into the stoma and balloon inflated with 5 mL saline within the stoma. Contrast was then introduced without leakage. Approximately 70 mL of water  soluble contrast ws introduce to the system; at that time, leakage around the stoma was noted and instillation was stopped. Fluoroscopy images were obtained  throughout the procedure. The balloon was completely deflated and removed from the stoma. FLUOROSCOPY: Radiation Exposure Index and estimated peak skin dose (PSD); Reference air kerma (RAK), 6.5 mGy. Kerma-area product (KAP), 217.5 uGy*m. COMPARISON:  05/11/24 Nephrostogram FINDINGS: 1. Contrast introduced via catheter is seen within the ileal conduit and retrograde opacification of the RIGHT renal collecting system. 2. The LEFT renal collecting system did not opacify. Patient to immediately transfer to CT imaging area for CT AP for further evaluation. IMPRESSION: 1. Contrast filling within the ileal conduit and retrograde opacification of the RIGHT renal collecting system. 2. The LEFT renal collecting system did not opacify. Electronically Signed   By: Thom Hughes M.D.   On: 05/12/2024 16:33   CT ABDOMEN PELVIS WO CONTRAST Result Date: 05/12/2024 CLINICAL DATA:  Status post prior cystectomy with ileal conduit formation. Recent surgery for bowel perforation. Suspected urine leak near the level ileal conduit due to surgical drain fluid return demonstrating elevated creatinine. EXAM: CT ABDOMEN AND PELVIS WITHOUT CONTRAST TECHNIQUE: Multidetector CT imaging of the abdomen and pelvis was performed following the standard protocol without IV contrast. Prior to the CT study, contrast was injected via a right lower quadrant urostomy into the ileal conduit RADIATION DOSE REDUCTION: This exam was performed according to the departmental dose-optimization program which includes automated exposure control, adjustment of the mA and/or kV according to patient size and/or use of iterative reconstruction technique. COMPARISON:  Recent CT studies on 05/03/2024 and 04/19/2024 FINDINGS: Lower chest: No acute abnormality. Hepatobiliary: No focal liver abnormality is seen. Status post cholecystectomy. No biliary dilatation. Pancreas: Unremarkable. No pancreatic ductal dilatation or surrounding inflammatory changes. Spleen: Normal in  size without focal abnormality. Adrenals/Urinary Tract: Bilateral percutaneous nephrostomy tubes are  present with pigtail portions formed in the renal pelvis bilaterally. There is some distension of the right renal collecting system with contrast due to free reflux of contrast injected into the ileal conduit with contrast opacification of the entire right ureter and renal collecting system. 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 This stenosis was characterized by nephrostogram yesterday. Status post cystectomy. Stomach/Bowel: Ileal conduit is well distended with injected contrast. The urostomy itself appears focally stenotic just before the abdominal wall within the subcutaneous fat. There then is another segment of stenosis right at the abdominal wall and extending just into the peritoneal cavity where there may be a very subtle pinpoint leak extending laterally over approximately 2 cm and paralleling just superior to one of the surgical drains that originates in the fall left lateral abdomen and ultimately crosses over into the far right lateral lower abdomen. There is an additional more obvious leak along the inferior aspect of the conduit on axial images 75-83 and also seen well on coronal reconstructions beginning as a thin column of extravasated injected contrast measuring approximately 4-5 mm in width and extending into the right lower abdominal wall at the level of one of the transversely oriented surgical drains. No evidence of bowel obstruction, ileus or free intraperitoneal air. Feeding tube extends into the mid stomach. Vascular/Lymphatic: No significant vascular findings are present. No enlarged abdominal or pelvic lymph nodes. Reproductive: No significant findings. Other: Edema of the right lower muscle wall and some ill-defined fluid in the right lower abdominal wall which appears stable  since the prior CT. Musculoskeletal: No acute or significant osseous findings. IMPRESSION: 1. 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 There then is another segment of stenosis right at the abdominal wall and extending just into the peritoneal cavity where there may be a very subtle pinpoint leak extending laterally over approximately 2 cm and paralleling just superior to one of the surgical drains that originates in the fall left lateral abdomen and ultimately crosses over into the far right lateral lower abdomen. 2. There is an additional more obvious leak along the inferior aspect of the conduit beginning as a thin column of extravasated injected contrast measuring approximately 4-5 mm in width and extending into the right lower abdominal wall at the level of one of the transversely oriented surgical drains. 3. Bilateral percutaneous nephrostomy tubes are present with pigtail portions formed in the renal pelvis bilaterally. There is some distension of the right renal collecting system with contrast due to free reflux of contrast injected into the ileal conduit with contrast opacification of the entire right ureter and renal collecting system. 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 This stenosis was characterized by nephrostogram yesterday. 4. Edema of the right lower muscle wall and some ill-defined fluid in the right lower abdominal wall which appears stable since the prior CT. Electronically Signed   By: Marcey Moan M.D.   On: 05/12/2024 14:32   IR NEPHROSTOMY EXCHANGE BILATERAL Result Date: 05/11/2024 INDICATION: Status post prior cystectomy with ileal conduit formation. Recent bowel surgery for bowel perforation. Concern for possible urine leak. EXAM: 1. LEFT PERCUTANEOUS  NEPHROSTOMY TUBE EXCHANGE UNDER FLUOROSCOPY INCLUDING FULL ANTEGRADE NEPHROSTOGRAM 2.  RIGHT PERCUTANEOUS NEPHROSTOMY TUBE EXCHANGE UNDER FLUOROSCOPY INCLUDING FULL ANTEGRADE NEPHROSTOGRAM COMPARISON:  None Available. MEDICATIONS: None ANESTHESIA/SEDATION: None CONTRAST:  50 mL Omnipaque  300-administered into the collecting system(s) FLUOROSCOPY: Radiation Exposure Index (as provided by the fluoroscopic device): 111 mGy Kerma COMPLICATIONS: None immediate. PROCEDURE: Informed written consent was obtained from the patient after a thorough discussion of the procedural risks, benefits and alternatives. All questions were addressed. Maximal Sterile Barrier Technique was utilized including caps, mask, sterile gowns, sterile gloves, sterile drape, hand hygiene and skin antiseptic. A timeout was performed prior to the initiation of the procedure. Both percutaneous nephrostomy tubes were injected with contrast material under fluoroscopy and multiple fluoroscopy loops and images were saved. The left percutaneous nephrostomy tube was then removed over a guidewire and exchanged for a 5 French catheter over a wire. The catheter was further advanced into the left ureter and additional nephrostogram performed to assess ureteral patency. A new 10 French percutaneous nephrostomy tube was then formed over a guidewire in the left renal pelvis A new 10 French right percutaneous nephrostomy tube was then exchanged over a guidewire and formed in the renal pelvis. Both nephrostomy tubes were secured at the skin with Prolene retention sutures, adhesive StatLock devices and attached to new gravity drainage bags. FINDINGS: Injection of the left percutaneous nephrostomy tube demonstrates retraction of the tube which is peripherally located in a lower pole calyx. Contrast injection demonstrates left-sided hydronephrosis and poor drainage into the ileal conduit with suggestion of a distal ureteral stricture. This was confirmed by advancement of  a 5 French catheter into the distal ureter with contrast injection demonstrating a high-grade stricture but flow of contrast through the stricture and into the ileal conduit once a catheter was advanced near the level of the stricture. There is no evidence of left-sided urine leak. The left nephrostomy tube was replaced with a new 10 French catheter formed at the level of the renal pelvis. A right nephrostogram demonstrates retraction the right nephrostomy tube into a lower pole calyx. Contrast injection demonstrates good flow of contrast into the right ureter and ileal conduit without evidence of ureteral stricture or urine leak. The right nephrostomy tube was replaced with a new 10 French catheter formed at the level of the renal pelvis. IMPRESSION: 1. 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 Injection of contrast at the level of the distal ureter does show flow through the stricture and into the ileal conduit. No left-sided urine leak demonstrated. 2. Right nephrostogram demonstrates normally patent ureter and ureteral anastomosis with the ileal conduit. No right-sided urine leak demonstrated. 3. Both nephrostomy tubes had retracted into lower pole calices and were replaced with new 10 French catheters formed at the level of the renal pelvis bilaterally. Electronically Signed   By: Marcey Moan M.D.   On: 05/11/2024 12:35   DG CHEST PORT 1 VIEW Result Date: 05/09/2024 CLINICAL DATA:  10026 Shortness of breath 10026 EXAM: PORTABLE CHEST - 1 VIEW COMPARISON:  04/22/2024 FINDINGS: Interval extubation. Feeding tube placement at least as far as the stomach, tip not seen. Stable right arm PICC. Interval removal of left IJ dialysis catheter. Lungs clear. Heart size and mediastinal contours are within normal limits. No  effusion. Visualized bones unremarkable. IMPRESSION: Interval extubation. No acute cardiopulmonary disease. Electronically Signed   By: JONETTA Faes M.D.   On:  05/09/2024 11:53   IR NEPHROSTOMY PLACEMENT BILATERAL Result Date: 05/04/2024 INDICATION: 73 year old male with history of cystectomy with ileal conduit presenting with bilateral distal renal obstruction. EXAM: 1. ULTRASOUND GUIDANCE FOR PUNCTURE OF THE left RENAL COLLECTING SYSTEM 2. Left PERCUTANEOUS NEPHROSTOMY TUBE PLACEMENT. 3. ULTRASOUND GUIDANCE FOR PUNCTURE OF THE right RENAL COLLECTING SYSTEM 4. Right PERCUTANEOUS NEPHROSTOMY TUBE PLACEMENT. COMPARISON:  None Available. MEDICATIONS: The patient was currently receiving intravenous antibiotics as an inpatient. No additional antibiotics were administered. ANESTHESIA/SEDATION: Moderate (conscious) sedation was employed during this procedure. A total of Versed  2 mg and Fentanyl  100 mcg was administered intravenously. Moderate Sedation Time: 17 minutes. The patient's level of consciousness and vital signs were monitored continuously by radiology nursing throughout the procedure under my direct supervision. CONTRAST:  Fifteen mL Isovue  300 - administered into the renal collecting system FLUOROSCOPY TIME:  Eleven mGy reference air kerma COMPLICATIONS: None immediate. PROCEDURE: The procedure, risks, benefits, and alternatives were explained to the patient. Questions regarding the procedure were encouraged and answered. The patient understands and consents to the procedure. A timeout was performed prior to the initiation of the procedure. The bilateral flank regions were prepped and draped in the usual sterile fashion and a sterile drape was applied covering the operative field. A sterile gown and sterile gloves were used for the procedure. Local anesthesia was provided with 1% Lidocaine  with epinephrine . Ultrasound was used to localize the left kidney. Under direct ultrasound guidance, a 20 gauge needle was  advanced into the renal collecting system. An ultrasound image documentation was performed. Access within the collecting system was confirmed with the efflux of urine followed by limited contrast injection. Over a Nitrex wire, the tract was dilated with an Accustick stent. Next, under intermittent fluoroscpic guidance and over a short Amplatz wire, the track was dilated ultimately allowing placement of a 10-French percutaneous nephrostomy catheter which was advanced to the level of the renal pelvis where the coil was formed and locked. Contrast was injected and several spot fluoroscopic images were obtained in various obliquities. The catheter was secured at the skin with a Prolene retention suture and stat lock device and connected to a gravity bag was placed. Ultrasound was used to localize the right kidney. Under direct ultrasound guidance, a 20 gauge needle was advanced into the renal collecting system. An ultrasound image documentation was performed. Access within the collecting system was confirmed with the efflux of urine followed by limited contrast injection. Over a Nitrex wire, the tract was dilated with an Accustick stent. Next, under intermittent fluoroscpic guidance and over a short Amplatz wire, the track was dilated ultimately allowing placement of a 10-French percutaneous nephrostomy catheter which was advanced to the level of the renal pelvis where the coil was formed and locked. Contrast was injected and several spot fluoroscopic images were obtained in various obliquities. The catheter was secured at the skin with a Prolene retention suture and stat lock device and connected to a gravity bag was placed. Dressings were applied. The patient tolerated procedure well without immediate postprocedural complication. FINDINGS: Ultrasound scanning demonstrates a moderate to severely dilated bilateral collecting systems. Under a combination of ultrasound and fluoroscopic guidance, a posterior inferior calix  was targeted allowing placement of bilateral 10-French percutaneous nephrostomy catheters with ends coiled and locked within the renal pelves. Contrast injection confirmed appropriate positioning. IMPRESSION: Successful ultrasound and fluoroscopic guided placement of a bilateral 10 French percutaneous nephrostomy tubes. Ester Sides, MD [facility] Radiology Electronically Signed   By:  Ester Sides M.D.   On: 05/04/2024 16:17   CT ABDOMEN PELVIS W CONTRAST Result Date: 05/03/2024 CLINICAL DATA:  Bladder dysfunction (Ped [id number]). 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 EXAM: CT ABDOMEN AND PELVIS WITH CONTRAST TECHNIQUE: Multidetector CT imaging of the abdomen and pelvis was performed using the standard protocol following bolus administration of intravenous contrast. RADIATION DOSE REDUCTION: This exam was performed according to the departmental dose-optimization program which includes automated exposure control, adjustment of the mA and/or kV according to patient size and/or use of iterative reconstruction technique. CONTRAST:  75mL OMNIPAQUE  IOHEXOL  350 MG/ML SOLN COMPARISON:  CT abdomen 04/19/2024 FINDINGS: Lower chest: Coronary artery calcification.  No acute abnormality. Hepatobiliary: No focal liver abnormality. Status post cholecystectomy. No biliary dilatation. Pancreas: Coarse calcifications throughout the parenchyma. No focal lesion. Normal pancreatic contour. No surrounding inflammatory changes. No main pancreatic ductal dilatation. Spleen: Normal in size without focal abnormality. Adrenals/Urinary Tract: No adrenal nodule bilaterally. Bilateral kidneys enhance symmetrically. Persistent severe left hydroureteronephrosis and moderate right hydroureteronephrosis. Renal parenchymal thinning and scarring on the left. Status post cystectomy. Right lower abdominal wall ileal conduit formation (3:81). Stomach/Bowel: Right abdominal lower  anterior abdominal wall and ileostomy formation. PO contrast opacifies the large bowel. Enteric tube with tip and side port within the gastric lumen. Stomach is within normal limits. No evidence of bowel wall thickening or dilatation. Colonic diverticulosis. Appendix appears normal. Vascular/Lymphatic: No abdominal aorta or iliac aneurysm. Moderate atherosclerotic plaque of the aorta and its branches. No abdominal, pelvic, or inguinal lymphadenopathy. Reproductive: Prostatectomy. Other: Bilateral lower abdominal wall surgical drains again noted with tips terminating within the right lower quadrant. Interval development of an organized fluid collection measuring 4.3 x 3.6 cm within the right anterolateral abdominal musculature with finding that appears to be contiguous with the peritoneum (3:46). Associated free fluid along the right lateral anterior abdomen that appears to possibly be originating from the ileum where ileal bowel sutures are noted within the right abdomen (3:68). Question fistulization and dehiscence of the ileal bowel sutures with the right abdomen of soft tissues (3:69). Associated free fluid along the right lateral anterior abdomen with extension into the soft tissue and fistulization not excluded between the abscess and the dermis (3:60). No free gas. Musculoskeletal: Possible developing supraumbilical tiny ventral hernia containing fluid. No suspicious lytic or blastic osseous lesions. No acute displaced fracture. IMPRESSION: 1. Interval development of an organized fluid collection measuring 4.3 x 3.6 cm within the right anterolateral abdominal musculature with finding that appears to be contiguous with the peritoneum. Extensive inflammatory changes along the abdomen and abdominal soft tissues on the right. Question fistulization and dehiscence of the ileal bowel sutures with the right abdomen of soft tissues. 2. Ileal conduit appears to course adjacent the inflammatory changes with no  urothelial thickening to suggest superimposed renal infection. 3. Possible developing supraumbilical tiny ventral hernia containing fluid. 4. Chronic pancreatitis with no findings of acute pancreatitis. 5. Colonic diverticulosis with no acute diverticulitis. 6. 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 7. Persistent severe left hydroureteronephrosis and moderate right hydroureteronephrosis. 8.  Aortic Atherosclerosis (ICD10-I70.0). Electronically Signed   By: Morgane  Naveau M.D.   On: 05/03/2024 16:42   DG Abd Portable 1V Result Date: 04/27/2024 CLINICAL DATA:  Feeding tube placement EXAM: PORTABLE ABDOMEN - 1 VIEW COMPARISON:  04/15/2024 FINDINGS: Frontal view of the lower chest and upper abdomen demonstrates enteric catheter tip projecting over the gastric body. Bowel gas  pattern is unremarkable. No acute bony abnormalities. IMPRESSION: 1. Enteric catheter tip projecting over the gastric body. Electronically Signed   By: Ozell Daring M.D.   On: 04/27/2024 15:11   Microbiology: Results for orders placed or performed during the hospital encounter of 04/10/24  Culture, blood (Routine X 2) w Reflex to ID Panel     Status: None   Collection Time: 04/13/24  7:13 AM   Specimen: BLOOD  Result Value Ref Range Status   Specimen Description   Final    BLOOD BLOOD LEFT ARM AEROBIC BOTTLE ONLY ANAEROBIC BOTTLE ONLY Performed at [facility], [address] [place], [place] [address]    Special Requests   Final    BOTTLES DRAWN AEROBIC AND ANAEROBIC Blood Culture results may not be optimal due to an inadequate volume of blood received in culture bottles Performed at [facility], [address] [place], [place] [address]    Culture   Final    NO GROWTH 5 DAYS Performed at [facility], [address], [place], [place] [address]    Report Status 04/18/2024 FINAL  Final  Culture, blood  (Routine X 2) w Reflex to ID Panel     Status: None   Collection Time: 04/13/24  7:20 AM   Specimen: BLOOD  Result Value Ref Range Status   Specimen Description   Final    BLOOD BLOOD RIGHT ARM AEROBIC BOTTLE ONLY ANAEROBIC BOTTLE ONLY Performed at [facility], [address] [place], [place] [address]    Special Requests   Final    BOTTLES DRAWN AEROBIC AND ANAEROBIC Blood Culture results may not be optimal due to an inadequate volume of blood received in culture bottles Performed at [facility], [address], [place], [place] [address]    Culture   Final    NO GROWTH 5 DAYS Performed at [facility], [address], [place], [place] [address]    Report Status 04/18/2024 FINAL  Final  Urine Culture     Status: None   Collection Time: 04/13/24 10:18 AM   Specimen: Urine, Clean Catch  Result Value Ref Range Status   Specimen Description   Final    URINE, CLEAN CATCH Performed at [facility], [address], [place], [place] [address]    Special Requests   Final    NONE Performed at [facility], [address] [place], [place] [address]    Culture   Final    NO GROWTH Performed at [facility], [address], [place], [place] [address]    Report Status 04/14/2024 FINAL  Final  MRSA Next Gen by PCR, Nasal     Status: None   Collection Time: 04/13/24 11:35 AM   Specimen: Nasal Mucosa; Nasal Swab  Result Value Ref Range Status   MRSA by PCR Next Gen NOT DETECTED NOT DETECTED Final    Comment: (NOTE) The GeneXpert MRSA Assay (FDA approved for NASAL specimens only), is one component of a comprehensive MRSA colonization surveillance program. It is not intended to diagnose MRSA infection nor to guide or monitor treatment for MRSA infections. Test performance is not FDA approved in patients less than 37 years old. Performed at [facility], [address] [place], [place] [address]   Culture, blood (Routine X 2) w Reflex to ID Panel     Status: None   Collection Time: 04/20/24  8:46 AM   Specimen: BLOOD RIGHT ARM  Result Value Ref Range Status   Specimen Description   Final    BLOOD RIGHT ARM Performed at [facility], [address], [place], [place] [address]    Special Requests   Final    BOTTLES DRAWN AEROBIC AND ANAEROBIC Blood Culture results may not be optimal due to an inadequate volume of blood received in culture bottles Performed at [facility], [address] [place], [place] [address]    Culture   Final    NO GROWTH 5 DAYS Performed at [facility], [address], [place], [place] [address]    Report Status 04/25/2024 FINAL  Final  Culture, blood (Routine X 2) w Reflex to ID Panel     Status: None   Collection Time: 04/20/24  8:49 AM   Specimen: BLOOD  Result Value Ref Range Status   Specimen Description   Final    BLOOD Performed at [facility], [address], [place], [place] [address]    Special Requests   Final    BOTTLES DRAWN AEROBIC AND ANAEROBIC Blood Culture results may not be optimal due to an inadequate volume of blood received in culture bottles Performed at [facility], [address], [place], [place] [address]    Culture   Final    NO GROWTH 5 DAYS Performed at [facility], [address], [place], [place] [address]    Report Status 04/25/2024 FINAL  Final  Culture, Respiratory w Gram Stain     Status: None   Collection Time: 04/20/24 12:03 PM   Specimen: Tracheal Aspirate; Respiratory  Result Value Ref Range Status   Specimen Description   Final    TRACHEAL ASPIRATE Performed at [facility], [address] [place], [place] [address]    Special Requests   Final    NONE Performed at [facility], [address] [place], [place] [address]    Gram Stain NO WBC SEEN RARE GRAM POSITIVE  COCCI   Final   Culture   Final    RARE Normal respiratory flora-no Staph aureus or Pseudomonas seen Performed at [facility], [address], [place], [place] [address]    Report Status 04/23/2024 FINAL  Final  Aerobic/Anaerobic Culture w Gram Stain (surgical/deep wound)     Status: None   Collection Time: 04/21/24  2:23 PM   Specimen: Soft Tissue, Other  Result Value Ref Range Status   Specimen Description   Final    TISSUE INTRA ABDOMINAL NECROTIC TISSUE Performed at [facility], [address], [place], [place] [address]    Special Requests   Final    NONE Performed at [facility], [address], [place], [place] [address]    Gram Stain   Final    FEW WBC PRESENT,BOTH PMN AND MONONUCLEAR RARE GRAM POSITIVE COCCI IN PAIRS AND CHAINS RARE YEAST    Culture   Final    RARE ESCHERICHIA COLI FEW CANDIDA ALBICANS FEW ENTEROCOCCUS FAECALIS FEW BACTEROIDES SPECIES BETA LACTAMASE POSITIVE Performed at [facility], [address] [place], [place] [address]    Report Status 04/25/2024 FINAL  Final   Organism ID, Bacteria ESCHERICHIA COLI  Final   Organism ID, Bacteria ENTEROCOCCUS FAECALIS  Final      Susceptibility   Escherichia coli - MIC*    AMPICILLIN >=32 RESISTANT Resistant  CEFEPIME  <=0.12 SENSITIVE Sensitive     CEFTAZIDIME <=1 SENSITIVE Sensitive     CEFTRIAXONE  1 SENSITIVE Sensitive     CIPROFLOXACIN  <=0.25 SENSITIVE Sensitive     GENTAMICIN  <=1 SENSITIVE Sensitive     IMIPENEM <=0.25 SENSITIVE Sensitive     TRIMETH /SULFA  <=20 SENSITIVE Sensitive     AMPICILLIN/SULBACTAM >=32 RESISTANT Resistant     PIP/TAZO 8 SENSITIVE Sensitive ug/mL    * RARE ESCHERICHIA COLI   Enterococcus faecalis - MIC*    AMPICILLIN <=2 SENSITIVE Sensitive     VANCOMYCIN  1 SENSITIVE Sensitive     GENTAMICIN  SYNERGY SENSITIVE Sensitive     * FEW ENTEROCOCCUS FAECALIS   Labs: CBC: Recent Labs  Lab 05/16/24 0326 05/18/24 0600  05/20/24 0845 05/21/24 0651 05/22/24 0630  WBC 8.8 10.4 10.3 10.0 10.1  NEUTROABS  --   --  6.6 5.6 6.0  HGB 12.9* 13.1 13.7 13.8 13.9  HCT 40.3 39.0 40.1 41.4 40.9  MCV 93.1 90.9 89.3 89.2 88.7  PLT 232 313 316 331 326   Basic Metabolic Panel: Recent Labs  Lab 05/19/24 0600 05/20/24 0354 05/21/24 0650 05/21/24 0651 05/22/24 0630  NA 129* 127* 127* 126* 127*  K 3.6 3.8 4.0 4.0 3.9  CL 102 99 96* 97* 97*  CO2 17* 17* 17* 18* 17*  GLUCOSE 95 104* 99 100* 108*  BUN 62* 69* 85* 84* 91*  CREATININE 1.74* 1.94* 2.10* 2.14* 2.09*  CALCIUM  10.0 10.0 10.3 10.3 10.2  MG  --   --   --  2.4 2.5*  PHOS 4.6 4.8* 5.6* 5.5* 5.4*   Liver Function Tests: Recent Labs  Lab 05/18/24 0600 05/19/24 0600 05/20/24 0354 05/21/24 0650 05/21/24 0651 05/22/24 0630  AST 46*  --   --   --  53* 54*  ALT 74*  --   --   --  93* 98*  ALKPHOS 85  --   --   --  86 89  BILITOT 0.5  --   --   --  0.7 0.8  PROT 7.4  --   --   --  7.2 7.2  ALBUMIN  3.1* 3.1* 3.2* 3.1* 3.1* 3.3*   CBG: Recent Labs  Lab 05/21/24 1132 05/21/24 1645 05/21/24 2112 05/22/24 0812 05/22/24 1128  GLUCAP 156* 147* 140* 103* 145*   Discharge time spent: greater than 30 minutes.  Signed: Alejandro Marker, DO [facility] 05/22/2024

## 2024-05-22 NOTE — Progress Notes (Signed)
 05/22/2024  Kristopher Thompson Merchant [id number] 02-10-1951  CARE TEAM: PCP: [name], MD  Outpatient Care Team: Patient Care Team: [name], MD as PCP - General (Internal Medicine) [name], MD (Inactive) as Consulting Physician (Dermatology) [name], MD as Referring Physician (Pulmonary Disease) [name], MD as Consulting Physician (General Surgery) [name], MD as Consulting Physician (Urology)  Inpatient Treatment Team: Treatment Team:  Sherrill Alejandro Donovan, DO [name], MD [name], MD Massie Delaine SAUNDERS, RN Dea Shiner, MD Ruthellen [facility], MD Luverne Aran, MD [name], MD [name], NT [name], MD Theophilus Caren FORBES, RN   Problem List:   Principal Problem:   Delayed bowel perforation s/p SBR/end ileostomy Active Problems:   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 08/08/2017   S/P ileal conduit (HCC)   GERD (gastroesophageal reflux disease)   Obesity (BMI 35.0-39.9 without comorbidity)   Prolonged QT interval   Non-recurrent bilateral inguinal hernia without obstruction or gangrene   Incarcerated incisional hernia   Sinus tachycardia   Tachypnea   Acute respiratory insufficiency, postoperative   Sepsis due to undetermined organism (HCC)   Lactic acidosis   Class 2 obesity   Chronic anticoagulation   Hearing loss   History of bladder cancer   Obstructive sleep apnea of adult   Parastomal hernia of ileal conduit   Partial small bowel obstruction (HCC)   Personal history of PE (pulmonary embolism)   Pressure injury of skin   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   Ileostomy in place [facility])   H/O insertion of nephrostomy tube   04/10/2024  POST-OPERATIVE DIAGNOSIS:  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   PROCEDURE:   -ROBOTIC LYSIS OF ADHESIONS X 4 HOURS -COMPONENT SEPARATION - TRANSVERSUS  ABDOMINIS REALEASE (TAR) BILATERAL -ROBOTIC REPAIRS OF  INCISIONAL, PARASTOMAL, LEFT INGUINAL  INCARCERATED ABDOMINAL WALL HERNIAS WITH MESH -UROSTOMY ILEAL CONDUIT REVISION -INTRAOPERATIVE ASSESSMENT OF TISSUE VASCULAR PERFUSION USING ICG (indocyanine green ) -IMMUNOFLUORESCENCE -TRANSVERSUS ABDOMINIS PLANE (TAP) BLOCK - BILATERAL    SURGEON:  [name], MD   04/20/2024 POST-OPERATIVE DIAGNOSIS:  perforated bowel   PROCEDURE:  Drainage of right abdominal wall abscess as well as intra-abdominal abscess Explantation of abdominal mesh Small bowel resection (in discontinuity) Placement of ABThera wound VAC   SURGEON:  [name], MD  04/21/2024  POST-OPERATIVE DIAGNOSIS:  DELAYED BOWEL PERFORATION WITH OPEN ABDOMEN   PROCEDURE:   LYSIS OF ADHESIONS X ABDOMINAL WALL DEBRIDEMENT ILEAL RESECTION END ILEOSTOMY ABDOMINAL WALL PARASTOMAL & INCISIONAL HERNIA REPAIRS WITH PHASIX MESH FASCIA CLOSURE WITH WOUND VAC PLACEMENT IN SQ   SURGEON:  [name], MD    05/04/2024 Interventional Radiology Procedure Note   Procedure: Bilateral percutaneous nephrostomy tube placements   Findings: Please refer to procedural dictation for full description. 10 Fr bilateral to bag drainage.   Complications: None immediate   Estimated Blood Loss: < 5 ml   Recommendations: Keep to bag drainage.  IR will follow.  Ultimate management as per Urology.   Assessment  -Incarcerated parastomal incisional hernias with obstructive symptoms status post robotic takedown and repair -Delayed small bowel perforation s/p ileal resection and end ileostomy -Delayed urine leak of urostomy ileal conduit status post perc nephrostomy tubes -Multisystem organ failure gradually resolving  IMPROVING   Plan:  Delayed urine leak with AKI:  Urinary diversion   Internal:  with percutaneous nephrostomy tubes.  Adjusted 6/30, 7/1, 7/3 with stent going from kidney out urostomy across  the presumed  mid ileal on the right side leak.  Urine draining into urostomy and right lower quadrant surgical Blake drain in the abdominal wall resting just outside the ileal conduit.  Left lower quadrant intraperitoneal Blake surgical drain with minimal output through the weekend.  I removed it 7/8.  Nephrostomy tubes & stenting per [name] [facility].    Abdominal wall Blake drain creatinine same as serum but guardedly hopeful sign that the mid ileal conduit leak has resolved with the nephrostomy tubes and stenting out the urostomy ileal conduit.  Defer to Dr. Renda.  I believe he is planning follow-up in a few weeks for repeat studies and testing and consider capping/adjusting/removing drains over time.  I would keep abdominal wall drain around the ileal conduit for last as a marker to make sure that the urine leak remains resolved.    If not controlled with urinary diversion and stenting, he may require revision of his ileal conduit.  That would be very high risk.  Understand they wish to hold off but do not want to have nephrostomy tubes indefinitely either if possible.  Defer to Dr. Renda urology with interventional radiology   Serum BUN & creatinine slowly coming down.  Likelihood of repeat dialysis seems rather low.  Nephrology following more peripherally  Nutrition:  PO Diet as tolerated.  Patient does not like renal diet and wishes to get back to a regular diet.  I wrote to switch to see how that goes.  If numbers worsening, may need to go back to a more restricted diet.  See what medicine/nephrology think  Still with intermittent nausea.  Will add low-dose metoclopramide  to see if that helps.  Normally I would do ondansetron  but given QT concerns will hold off  Bowel regimen for ileostomy.  FiberCon twice daily and loperamide  twice daily seems to be a good mix.    Weaned off TPN 6/16  Corpak removed 7/2  Hold off on G-tube unless poor PO effort >14days  Infection due to delayed  bowel perforation and abdominal wall infection/necrosis  Growing multiple organisms including Candida albicans, Enterobacter, and Enterococcus.    IV Zosyn /micafungin  x6 weeks, completed 7/7.    Midline incision wound with good granulation.  I would recommend continued wound VAC to help it heal faster and minimize complications.  Suspect within a couple weeks can be transition just to dry dressing only but we will see.   Metabolic encephalopathy resolved.  Follow.  Insomnia improved  ABLA on top of anemia of chronic disease improved with transfusion.  Follow.    -monitor electrolytes & replace as needed.  Keep K>4, Mg>2, Phos>3.    -Diabetes.  Sliding scale insulin .    -VTE prophylaxis-  Full anticoagulation given history of pulmonary embolism.  Eliquis .  -Mobilize as tolerated.  Starting to work with physical and Occupational Therapy more.  He tends to refuse to get out of bed.  External encouraged him to get out of bed to rehab more.    I updated the patient's status to the the patient, wife, and nurses.  Recommendations were made.  Questions were answered.  They expressed understanding & appreciation.  -Disposition: Plan to move to skilled nursing facility with some rehab capabilities today, 7/11.  I believe [facility] skilled nursing facility  STABLE TO D/C FROM SURGERY STANDPOINT      I reviewed nursing notes, last 24 h vitals and pain scores, last 48 h intake and output, last 24 h labs and trends, and  last 24 h imaging results.  I have reviewed this patient's available data, including medical history, events of note, test results, etc as part of my evaluation.   A significant portion of that time was spent in counseling. Care during the described time interval was provided by me.  This care required high  level of medical decision making.  05/22/2024    Subjective: (Chief complaint)  Patient eating better.  Denies nausea.  Wife in room.  No major events.     Tentative plans for transfer to skilled facility with rehab    Objective:  Vital signs:  Vitals:   05/21/24 1123 05/21/24 1805 05/21/24 1935 05/21/24 2304  BP: 95/73 104/74 103/74 106/73  Pulse: 99 100 (!) 108 97  Resp: 18 18 16 16   Temp: 97.6 F (36.4 C) 97.6 F (36.4 C) 97.8 F (36.6 C) 97.9 F (36.6 C)  TempSrc: Oral Oral Oral Oral  SpO2: 97% 97% 95% 97%  Weight:      Height:        Last BM Date : 05/21/24 (ileostomy')  Intake/Output   Yesterday:  07/10 0701 - 07/11 0700 In: -  Out: 1450 [Urine:925; Drains:25; Stool:500] This shift:  Total I/O In: -  Out: 400 [Urine:300; Stool:100]  Bowel function:  Flatus: YES  BM:  YES  Drains:  RLQ surgical Blake drain to biliary gravity bag drain (rests between abdominal wall and Phasix mesh): Serous low volume. R & L back nephrostomy tubes in place.  - light yellow-colored -scant on left  Physical Exam:  General: Resting in no acute distress.  Legs crossed looking relaxed.  Mentally back to baseline - chatty today.   Eyes: Glasses PERRL, normal EOM.  Sclera clear.  No icterus Neuro: CN II-XII intact w/o focal sensory/motor deficits. Lymph: No head/neck/groin lymphadenopathy Psych:  No delerium/psychosis/paranoia.  No agitation HENT: Normocephalic, Mucus membranes moist.  No thrush.  Mildly hard of hearing Neck: Supple, No tracheal deviation.  No obvious thyromegaly Chest: No pain to chest wall compression.  Good respiratory excursion.   CV:  Pulses intact.  Regular rhythm.  1-2+ BUE/BLE edema MS:  No obvious deformity  Abdomen:  Obese Soft.  Nondistended.  Nontender Wound vac (in SQ over closed fascia) in midline.    RUQ: (End ileostomy): Pink mucosa.  Brown thicker effluent in bag RLQ:  (Urostomy ileal conduit): Pink mucosa without any obvious separation.  Moderate clear light orange.  Stent out ostomy  Ext:   No deformity.  No edema.  No cyanosis Skin: No petechiae / purpurea.  No major sores.  Warm and  dry    Results:   Cultures: No results found for this or any previous visit (from the past 720 hours).   Labs: Results for orders placed or performed during the hospital encounter of 04/10/24 (from the past 48 hours)  Glucose, capillary     Status: Abnormal   Collection Time: 05/20/24  7:51 AM  Result Value Ref Range   Glucose-Capillary 111 (H) 70 - 99 mg/dL    Comment: Glucose reference range applies only to samples taken after fasting for at least 8 hours.  CBC with Differential/Platelet     Status: Abnormal   Collection Time: 05/20/24  8:45 AM  Result Value Ref Range   WBC 10.3 4.0 - 10.5 K/uL   RBC 4.49 4.22 - 5.81 MIL/uL   Hemoglobin 13.7 13.0 - 17.0 g/dL   HCT 59.8 60.9 - 47.9 %   MCV 89.3 80.0 - 100.0  fL   MCH 30.5 26.0 - 34.0 pg   MCHC 34.2 30.0 - 36.0 g/dL   RDW 83.5 (H) 88.4 - 84.4 %   Platelets 316 150 - 400 K/uL   nRBC 0.0 0.0 - 0.2 %   Neutrophils Relative % 63 %   Neutro Abs 6.6 1.7 - 7.7 K/uL   Lymphocytes Relative 16 %   Lymphs Abs 1.6 0.7 - 4.0 K/uL   Monocytes Relative 14 %   Monocytes Absolute 1.4 (H) 0.1 - 1.0 K/uL   Eosinophils Relative 4 %   Eosinophils Absolute 0.4 0.0 - 0.5 K/uL   Basophils Relative 1 %   Basophils Absolute 0.1 0.0 - 0.1 K/uL   Immature Granulocytes 2 %   Abs Immature Granulocytes 0.19 (H) 0.00 - 0.07 K/uL    Comment: Performed at [facility], [address], [place], [place] [address]  Glucose, capillary     Status: Abnormal   Collection Time: 05/20/24 11:42 AM  Result Value Ref Range   Glucose-Capillary 128 (H) 70 - 99 mg/dL    Comment: Glucose reference range applies only to samples taken after fasting for at least 8 hours.  Glucose, capillary     Status: Abnormal   Collection Time: 05/20/24  4:44 PM  Result Value Ref Range   Glucose-Capillary 172 (H) 70 - 99 mg/dL    Comment: Glucose reference range applies only to samples taken after fasting for at least 8 hours.  Glucose, capillary     Status: Abnormal    Collection Time: 05/20/24  8:16 PM  Result Value Ref Range   Glucose-Capillary 137 (H) 70 - 99 mg/dL    Comment: Glucose reference range applies only to samples taken after fasting for at least 8 hours.  Glucose, capillary     Status: Abnormal   Collection Time: 05/20/24 11:19 PM  Result Value Ref Range   Glucose-Capillary 114 (H) 70 - 99 mg/dL    Comment: Glucose reference range applies only to samples taken after fasting for at least 8 hours.  Glucose, capillary     Status: Abnormal   Collection Time: 05/21/24  2:58 AM  Result Value Ref Range   Glucose-Capillary 130 (H) 70 - 99 mg/dL    Comment: Glucose reference range applies only to samples taken after fasting for at least 8 hours.  Renal function panel     Status: Abnormal   Collection Time: 05/21/24  6:50 AM  Result Value Ref Range   Sodium 127 (L) 135 - 145 mmol/L   Potassium 4.0 3.5 - 5.1 mmol/L   Chloride 96 (L) 98 - 111 mmol/L   CO2 17 (L) 22 - 32 mmol/L   Glucose, Bld 99 70 - 99 mg/dL    Comment: Glucose reference range applies only to samples taken after fasting for at least 8 hours.   BUN 85 (H) 8 - 23 mg/dL   Creatinine, Ser 7.89 (H) 0.61 - 1.24 mg/dL   Calcium  10.3 8.9 - 10.3 mg/dL   Phosphorus 5.6 (H) 2.5 - 4.6 mg/dL   Albumin  3.1 (L) 3.5 - 5.0 g/dL   GFR, Estimated 33 (L) >60 mL/min    Comment: (NOTE) Calculated using the CKD-EPI Creatinine Equation (2021)    Anion gap 14 5 - 15    Comment: Performed at [facility], [address], [place], [place] [address]  CBC with Differential/Platelet     Status: Abnormal   Collection Time: 05/21/24  6:51 AM  Result Value  Ref Range   WBC 10.0 4.0 - 10.5 K/uL   RBC 4.64 4.22 - 5.81 MIL/uL   Hemoglobin 13.8 13.0 - 17.0 g/dL   HCT 58.5 60.9 - 47.9 %   MCV 89.2 80.0 - 100.0 fL   MCH 29.7 26.0 - 34.0 pg   MCHC 33.3 30.0 - 36.0 g/dL   RDW 83.7 (H) 88.4 - 84.4 %   Platelets 331 150 - 400 K/uL   nRBC 0.0 0.0 - 0.2 %   Neutrophils Relative % 55 %   Neutro Abs 5.6  1.7 - 7.7 K/uL   Lymphocytes Relative 21 %   Lymphs Abs 2.1 0.7 - 4.0 K/uL   Monocytes Relative 16 %   Monocytes Absolute 1.6 (H) 0.1 - 1.0 K/uL   Eosinophils Relative 4 %   Eosinophils Absolute 0.4 0.0 - 0.5 K/uL   Basophils Relative 2 %   Basophils Absolute 0.2 (H) 0.0 - 0.1 K/uL   Immature Granulocytes 2 %   Abs Immature Granulocytes 0.19 (H) 0.00 - 0.07 K/uL    Comment: Performed at [facility], [address] [place], [place] [address]  Comprehensive metabolic panel with GFR     Status: Abnormal   Collection Time: 05/21/24  6:51 AM  Result Value Ref Range   Sodium 126 (L) 135 - 145 mmol/L   Potassium 4.0 3.5 - 5.1 mmol/L   Chloride 97 (L) 98 - 111 mmol/L   CO2 18 (L) 22 - 32 mmol/L   Glucose, Bld 100 (H) 70 - 99 mg/dL    Comment: Glucose reference range applies only to samples taken after fasting for at least 8 hours.   BUN 84 (H) 8 - 23 mg/dL   Creatinine, Ser 7.85 (H) 0.61 - 1.24 mg/dL   Calcium  10.3 8.9 - 10.3 mg/dL   Total Protein 7.2 6.5 - 8.1 g/dL   Albumin  3.1 (L) 3.5 - 5.0 g/dL   AST 53 (H) 15 - 41 U/L   ALT 93 (H) 0 - 44 U/L   Alkaline Phosphatase 86 38 - 126 U/L   Total Bilirubin 0.7 0.0 - 1.2 mg/dL   GFR, Estimated 32 (L) >60 mL/min    Comment: (NOTE) Calculated using the CKD-EPI Creatinine Equation (2021)    Anion gap 11 5 - 15    Comment: Performed at [facility], [address] [place], [place] [address]  Phosphorus     Status: Abnormal   Collection Time: 05/21/24  6:51 AM  Result Value Ref Range   Phosphorus 5.5 (H) 2.5 - 4.6 mg/dL    Comment: Performed at [facility], [address], [place], [place] [address]  Magnesium      Status: None   Collection Time: 05/21/24  6:51 AM  Result Value Ref Range   Magnesium  2.4 1.7 - 2.4 mg/dL    Comment: Performed at [facility], [address] [place], [place] [address]  Glucose, capillary     Status: Abnormal   Collection Time: 05/21/24  8:00 AM  Result Value Ref Range    Glucose-Capillary 117 (H) 70 - 99 mg/dL    Comment: Glucose reference range applies only to samples taken after fasting for at least 8 hours.  Glucose, capillary     Status: Abnormal   Collection Time: 05/21/24 11:32 AM  Result Value Ref Range   Glucose-Capillary 156 (H) 70 - 99 mg/dL    Comment: Glucose reference range applies only to samples taken after fasting  for at least 8 hours.  Glucose, capillary     Status: Abnormal   Collection Time: 05/21/24  4:45 PM  Result Value Ref Range   Glucose-Capillary 147 (H) 70 - 99 mg/dL    Comment: Glucose reference range applies only to samples taken after fasting for at least 8 hours.  Creatinine, fluid (pleural, peritoneal, JP Drainage)     Status: None   Collection Time: 05/21/24  5:49 PM  Result Value Ref Range   Creat, Fluid 2.0 mg/dL    Comment: (NOTE) No normal range established for this test Results should be evaluated in conjunction with serum values    Fluid Type-FCRE JP DRAINAGE     Comment: Performed at [facility], [address] [place], [place] [address]  Glucose, capillary     Status: Abnormal   Collection Time: 05/21/24  9:12 PM  Result Value Ref Range   Glucose-Capillary 140 (H) 70 - 99 mg/dL    Comment: Glucose reference range applies only to samples taken after fasting for at least 8 hours.    Imaging / Studies: No results found.       Medications / Allergies: per chart  Antibiotics: Anti-infectives (From admission, onward)    Start     Dose/Rate Route Frequency Ordered Stop   05/06/24 1400  piperacillin -tazobactam (ZOSYN ) IVPB 3.375 g        3.375 g 12.5 mL/hr over 240 Minutes Intravenous Every 8 hours 05/06/24 1227 05/19/24 0900   05/01/24 1400  piperacillin -tazobactam (ZOSYN ) IVPB 2.25 g  Status:  Discontinued        2.25 g 100 mL/hr over 30 Minutes Intravenous Every 8 hours 05/01/24 0957 05/06/24 1227   05/01/24 1045  micafungin  (MYCAMINE ) 100 mg in sodium chloride  0.9 % 100 mL IVPB        100  mg 105 mL/hr over 1 Hours Intravenous Every 24 hours 05/01/24 0949 05/18/24 1651   04/29/24 1000  micafungin  (MYCAMINE ) 150 mg in sodium chloride  0.9 % 100 mL IVPB  Status:  Discontinued        150 mg 107.5 mL/hr over 1 Hours Intravenous Every 24 hours 04/28/24 1036 05/01/24 0949   04/28/24 1345  micafungin  (MYCAMINE ) 100 mg in sodium chloride  0.9 % 100 mL IVPB        100 mg 105 mL/hr over 1 Hours Intravenous  Once 04/28/24 1245 04/28/24 1354   04/28/24 1000  micafungin  (MYCAMINE ) 100 mg in sodium chloride  0.9 % 100 mL IVPB  Status:  Discontinued        100 mg 105 mL/hr over 1 Hours Intravenous Every 24 hours 04/27/24 1219 04/28/24 1036   04/21/24 1400  clindamycin  (CLEOCIN ) 900 mg, gentamicin  (GARAMYCIN ) 240 mg in sodium chloride  0.9 % 1,000 mL for intraperitoneal lavage  Status:  Discontinued         Irrigation To Surgery 04/21/24 1346 04/21/24 1618   04/20/24 1400  piperacillin -tazobactam (ZOSYN ) IVPB 3.375 g  Status:  Discontinued        3.375 g 12.5 mL/hr over 240 Minutes Intravenous Every 8 hours 04/20/24 0755 05/01/24 0957   04/20/24 1000  metroNIDAZOLE  (FLAGYL ) IVPB 500 mg  Status:  Discontinued        500 mg 100 mL/hr over 60 Minutes Intravenous Every 12 hours 04/20/24 0320 04/20/24 0752   04/20/24 1000  micafungin  (MYCAMINE ) 150 mg in sodium chloride  0.9 % 100 mL IVPB  Status:  Discontinued        150 mg 107.5 mL/hr  over 1 Hours Intravenous Every 24 hours 04/20/24 0752 04/27/24 1219   04/20/24 0245  ceFEPIme  (MAXIPIME ) 2 g in sodium chloride  0.9 % 100 mL IVPB  Status:  Discontinued        2 g 200 mL/hr over 30 Minutes Intravenous Every 8 hours 04/20/24 0232 04/20/24 0752   04/20/24 0100  metroNIDAZOLE  (FLAGYL ) IVPB 500 mg        500 mg 100 mL/hr over 60 Minutes Intravenous On call to O.R. 04/20/24 0004 04/20/24 0100   04/14/24 1800  ceFEPIme  (MAXIPIME ) 2 g in sodium chloride  0.9 % 100 mL IVPB        2 g 200 mL/hr over 30 Minutes Intravenous Every 8 hours 04/14/24 1003 04/19/24  0957   04/14/24 1600  vancomycin  (VANCOCIN ) IVPB 1000 mg/200 mL premix  Status:  Discontinued        1,000 mg 200 mL/hr over 60 Minutes Intravenous Every 24 hours 04/13/24 1420 04/14/24 1000   04/14/24 1600  vancomycin  (VANCOREADY) IVPB 1500 mg/300 mL  Status:  Discontinued        1,500 mg 150 mL/hr over 120 Minutes Intravenous Every 24 hours 04/14/24 1002 04/16/24 0744   04/14/24 1200  vancomycin  (VANCOCIN ) IVPB 1000 mg/200 mL premix  Status:  Discontinued        1,000 mg 200 mL/hr over 60 Minutes Intravenous Every 24 hours 04/13/24 1053 04/13/24 1109   04/13/24 2200  ceFEPIme  (MAXIPIME ) 2 g in sodium chloride  0.9 % 100 mL IVPB  Status:  Discontinued        2 g 200 mL/hr over 30 Minutes Intravenous Every 12 hours 04/13/24 1036 04/13/24 1109   04/13/24 2200  ceFEPIme  (MAXIPIME ) 2 g in sodium chloride  0.9 % 100 mL IVPB  Status:  Discontinued        2 g 200 mL/hr over 30 Minutes Intravenous Every 12 hours 04/13/24 1425 04/14/24 1003   04/13/24 1500  vancomycin  (VANCOCIN ) 2,000 mg in sodium chloride  0.9 % 500 mL IVPB        2,000 mg 260 mL/hr over 120 Minutes Intravenous  Once 04/13/24 1409 04/13/24 1850   04/13/24 1500  ceFEPIme  (MAXIPIME ) 2 g in sodium chloride  0.9 % 100 mL IVPB  Status:  Discontinued        2 g 200 mL/hr over 30 Minutes Intravenous Every 12 hours 04/13/24 1420 04/13/24 1425   04/13/24 1500  metroNIDAZOLE  (FLAGYL ) IVPB 500 mg  Status:  Discontinued        500 mg 100 mL/hr over 60 Minutes Intravenous Every 12 hours 04/13/24 1420 04/17/24 0725   04/13/24 1200  vancomycin  (VANCOCIN ) 2,000 mg in sodium chloride  0.9 % 500 mL IVPB  Status:  Discontinued        2,000 mg 260 mL/hr over 120 Minutes Intravenous  Once 04/13/24 1052 04/13/24 1121   04/13/24 1100  metroNIDAZOLE  (FLAGYL ) IVPB 500 mg  Status:  Discontinued        500 mg 100 mL/hr over 60 Minutes Intravenous 2 times daily 04/13/24 1007 04/13/24 1109   04/13/24 1100  vancomycin  (VANCOCIN ) IVPB 1000 mg/200 mL premix   Status:  Discontinued        1,000 mg 200 mL/hr over 60 Minutes Intravenous  Once 04/13/24 1007 04/13/24 1020   04/13/24 1015  ceFEPIme  (MAXIPIME ) 2 g in sodium chloride  0.9 % 100 mL IVPB        2 g 200 mL/hr over 30 Minutes Intravenous STAT 04/13/24 1007 04/14/24 1721   04/10/24 2200  ceFAZolin  (ANCEF ) IVPB 2g/100 mL premix        2 g 200 mL/hr over 30 Minutes Intravenous Every 8 hours 04/10/24 1833 04/11/24 0531   04/10/24 0600  ceFAZolin  (ANCEF ) IVPB 2g/100 mL premix        2 g 200 mL/hr over 30 Minutes Intravenous On call to O.R. 04/10/24 0533 04/10/24 1526         Note: Portions of this report may have been transcribed using voice recognition software. Every effort was made to ensure accuracy; however, inadvertent computerized transcription errors may be present.   Any transcriptional errors that result from this process are unintentional.    Elspeth KYM Schultze, MD, FACS, MASCRS Esophageal, Gastrointestinal & Colorectal Surgery Robotic and Minimally Invasive Surgery  [facility] [address], [address] [place], [place] [address] [phone] Fax [phone] Main  CONTACT INFORMATION: Weekday (9AM-5PM): Call [facility] main office at [phone] Weeknight (5PM-9AM) or Weekend/Holiday: Check EPIC Web Links tab & use AMION (password  [email or web address]) for General Surgery [facility] coverage  Please, DO NOT use SecureChat  (it is not reliable communication to reach operating surgeons & will lead to a delay in care).   Epic staff messaging available for outptient concerns needing 1-2 business day response.      05/22/2024  6:30 AM

## 2024-05-22 NOTE — Progress Notes (Signed)
 Patient ID: Kristopher Thompson, male   DOB: [birth year], 73 y.o.   MRN: [id number]  Drain Cr 2.0 cw serum.  This is excellent news but would proceed cautiously.  Leave all drains/nephrostomy tubes in place upon discharge and will plan to re-study radiologically as an outpatient.

## 2024-05-25 ENCOUNTER — Encounter (HOSPITAL_COMMUNITY): Payer: Self-pay

## 2024-05-25 ENCOUNTER — Inpatient Hospital Stay (HOSPITAL_COMMUNITY)

## 2024-05-25 ENCOUNTER — Inpatient Hospital Stay (HOSPITAL_COMMUNITY)
Admission: EM | Admit: 2024-05-25 | Discharge: 2024-05-27 | DRG: 394 | Disposition: A | Discharge status: Rehabilitation Facility | Source: Other Acute Inpatient Hospital | Attending: Family Medicine | Admitting: Family Medicine

## 2024-05-25 DIAGNOSIS — I5032 Chronic diastolic (congestive) heart failure: Secondary | ICD-10-CM | POA: Diagnosis present

## 2024-05-25 DIAGNOSIS — I130 Hypertensive heart and chronic kidney disease with heart failure and stage 1 through stage 4 chronic kidney disease, or unspecified chronic kidney disease: Secondary | ICD-10-CM | POA: Diagnosis present

## 2024-05-25 DIAGNOSIS — E1122 Type 2 diabetes mellitus with diabetic chronic kidney disease: Secondary | ICD-10-CM | POA: Diagnosis present

## 2024-05-25 DIAGNOSIS — E871 Hypo-osmolality and hyponatremia: Secondary | ICD-10-CM | POA: Diagnosis present

## 2024-05-25 DIAGNOSIS — K219 Gastro-esophageal reflux disease without esophagitis: Secondary | ICD-10-CM | POA: Diagnosis present

## 2024-05-25 DIAGNOSIS — E860 Dehydration: Secondary | ICD-10-CM | POA: Diagnosis present

## 2024-05-25 DIAGNOSIS — R1084 Generalized abdominal pain: Secondary | ICD-10-CM | POA: Diagnosis not present

## 2024-05-25 DIAGNOSIS — E440 Moderate protein-calorie malnutrition: Secondary | ICD-10-CM | POA: Diagnosis present

## 2024-05-25 DIAGNOSIS — E46 Unspecified protein-calorie malnutrition: Secondary | ICD-10-CM | POA: Diagnosis present

## 2024-05-25 DIAGNOSIS — N1832 Chronic kidney disease, stage 3b: Secondary | ICD-10-CM | POA: Diagnosis present

## 2024-05-25 DIAGNOSIS — Z936 Other artificial openings of urinary tract status: Secondary | ICD-10-CM | POA: Diagnosis not present

## 2024-05-25 DIAGNOSIS — Z7984 Long term (current) use of oral hypoglycemic drugs: Secondary | ICD-10-CM

## 2024-05-25 DIAGNOSIS — B9629 Other Escherichia coli [E. coli] as the cause of diseases classified elsewhere: Secondary | ICD-10-CM | POA: Diagnosis not present

## 2024-05-25 DIAGNOSIS — Z906 Acquired absence of other parts of urinary tract: Secondary | ICD-10-CM

## 2024-05-25 DIAGNOSIS — Z932 Ileostomy status: Secondary | ICD-10-CM

## 2024-05-25 DIAGNOSIS — Z79899 Other long term (current) drug therapy: Secondary | ICD-10-CM

## 2024-05-25 DIAGNOSIS — F4321 Adjustment disorder with depressed mood: Secondary | ICD-10-CM | POA: Diagnosis not present

## 2024-05-25 DIAGNOSIS — R112 Nausea with vomiting, unspecified: Secondary | ICD-10-CM | POA: Diagnosis present

## 2024-05-25 DIAGNOSIS — C679 Malignant neoplasm of bladder, unspecified: Secondary | ICD-10-CM | POA: Diagnosis present

## 2024-05-25 DIAGNOSIS — D631 Anemia in chronic kidney disease: Secondary | ICD-10-CM | POA: Diagnosis present

## 2024-05-25 DIAGNOSIS — Z87891 Personal history of nicotine dependence: Secondary | ICD-10-CM

## 2024-05-25 DIAGNOSIS — E669 Obesity, unspecified: Secondary | ICD-10-CM | POA: Diagnosis present

## 2024-05-25 DIAGNOSIS — I4510 Unspecified right bundle-branch block: Secondary | ICD-10-CM | POA: Diagnosis not present

## 2024-05-25 DIAGNOSIS — Z1624 Resistance to multiple antibiotics: Secondary | ICD-10-CM | POA: Diagnosis not present

## 2024-05-25 DIAGNOSIS — B961 Klebsiella pneumoniae [K. pneumoniae] as the cause of diseases classified elsewhere: Secondary | ICD-10-CM | POA: Diagnosis not present

## 2024-05-25 DIAGNOSIS — Z86711 Personal history of pulmonary embolism: Secondary | ICD-10-CM

## 2024-05-25 DIAGNOSIS — Z7401 Bed confinement status: Secondary | ICD-10-CM | POA: Diagnosis not present

## 2024-05-25 DIAGNOSIS — D638 Anemia in other chronic diseases classified elsewhere: Secondary | ICD-10-CM | POA: Diagnosis not present

## 2024-05-25 DIAGNOSIS — T8579XD Infection and inflammatory reaction due to other internal prosthetic devices, implants and grafts, subsequent encounter: Secondary | ICD-10-CM | POA: Diagnosis not present

## 2024-05-25 DIAGNOSIS — K9419 Other complications of enterostomy: Secondary | ICD-10-CM | POA: Diagnosis present

## 2024-05-25 DIAGNOSIS — N179 Acute kidney failure, unspecified: Secondary | ICD-10-CM | POA: Diagnosis present

## 2024-05-25 DIAGNOSIS — Z7901 Long term (current) use of anticoagulants: Secondary | ICD-10-CM | POA: Diagnosis not present

## 2024-05-25 DIAGNOSIS — Z6831 Body mass index (BMI) 31.0-31.9, adult: Secondary | ICD-10-CM

## 2024-05-25 DIAGNOSIS — Z9079 Acquired absence of other genital organ(s): Secondary | ICD-10-CM

## 2024-05-25 DIAGNOSIS — K121 Other forms of stomatitis: Secondary | ICD-10-CM | POA: Diagnosis present

## 2024-05-25 DIAGNOSIS — G7281 Critical illness myopathy: Secondary | ICD-10-CM | POA: Diagnosis not present

## 2024-05-25 DIAGNOSIS — N136 Pyonephrosis: Secondary | ICD-10-CM | POA: Diagnosis not present

## 2024-05-25 DIAGNOSIS — B9620 Unspecified Escherichia coli [E. coli] as the cause of diseases classified elsewhere: Secondary | ICD-10-CM | POA: Diagnosis not present

## 2024-05-25 DIAGNOSIS — Z8249 Family history of ischemic heart disease and other diseases of the circulatory system: Secondary | ICD-10-CM

## 2024-05-25 DIAGNOSIS — L24B3 Irritant contact dermatitis related to fecal or urinary stoma or fistula: Secondary | ICD-10-CM | POA: Diagnosis not present

## 2024-05-25 DIAGNOSIS — A419 Sepsis, unspecified organism: Secondary | ICD-10-CM | POA: Diagnosis present

## 2024-05-25 DIAGNOSIS — I951 Orthostatic hypotension: Secondary | ICD-10-CM | POA: Diagnosis present

## 2024-05-25 DIAGNOSIS — Z87890 Personal history of sex reassignment: Secondary | ICD-10-CM | POA: Diagnosis not present

## 2024-05-25 DIAGNOSIS — F419 Anxiety disorder, unspecified: Secondary | ICD-10-CM | POA: Diagnosis present

## 2024-05-25 DIAGNOSIS — T8149XD Infection following a procedure, other surgical site, subsequent encounter: Secondary | ICD-10-CM | POA: Diagnosis not present

## 2024-05-25 DIAGNOSIS — I959 Hypotension, unspecified: Secondary | ICD-10-CM | POA: Diagnosis present

## 2024-05-25 DIAGNOSIS — E861 Hypovolemia: Secondary | ICD-10-CM | POA: Diagnosis present

## 2024-05-25 DIAGNOSIS — R790 Abnormal level of blood mineral: Secondary | ICD-10-CM | POA: Diagnosis not present

## 2024-05-25 DIAGNOSIS — I2510 Atherosclerotic heart disease of native coronary artery without angina pectoris: Secondary | ICD-10-CM | POA: Diagnosis present

## 2024-05-25 DIAGNOSIS — L7634 Postprocedural seroma of skin and subcutaneous tissue following other procedure: Secondary | ICD-10-CM | POA: Diagnosis present

## 2024-05-25 DIAGNOSIS — D62 Acute posthemorrhagic anemia: Secondary | ICD-10-CM | POA: Diagnosis present

## 2024-05-25 DIAGNOSIS — N178 Other acute kidney failure: Secondary | ICD-10-CM | POA: Diagnosis not present

## 2024-05-25 DIAGNOSIS — E785 Hyperlipidemia, unspecified: Secondary | ICD-10-CM | POA: Diagnosis present

## 2024-05-25 DIAGNOSIS — R5381 Other malaise: Secondary | ICD-10-CM | POA: Diagnosis present

## 2024-05-25 DIAGNOSIS — B9622 Other specified Shiga toxin-producing Escherichia coli [E. coli] (STEC) as the cause of diseases classified elsewhere: Secondary | ICD-10-CM | POA: Diagnosis not present

## 2024-05-25 DIAGNOSIS — L02211 Cutaneous abscess of abdominal wall: Secondary | ICD-10-CM | POA: Diagnosis not present

## 2024-05-25 DIAGNOSIS — G4700 Insomnia, unspecified: Secondary | ICD-10-CM | POA: Diagnosis present

## 2024-05-25 DIAGNOSIS — Y848 Other medical procedures as the cause of abnormal reaction of the patient, or of later complication, without mention of misadventure at the time of the procedure: Secondary | ICD-10-CM | POA: Diagnosis present

## 2024-05-25 DIAGNOSIS — E119 Type 2 diabetes mellitus without complications: Secondary | ICD-10-CM | POA: Diagnosis not present

## 2024-05-25 DIAGNOSIS — Y838 Other surgical procedures as the cause of abnormal reaction of the patient, or of later complication, without mention of misadventure at the time of the procedure: Secondary | ICD-10-CM | POA: Diagnosis present

## 2024-05-25 DIAGNOSIS — Z8551 Personal history of malignant neoplasm of bladder: Secondary | ICD-10-CM

## 2024-05-25 DIAGNOSIS — N189 Chronic kidney disease, unspecified: Secondary | ICD-10-CM | POA: Diagnosis not present

## 2024-05-25 DIAGNOSIS — Z794 Long term (current) use of insulin: Secondary | ICD-10-CM | POA: Diagnosis not present

## 2024-05-25 DIAGNOSIS — B9689 Other specified bacterial agents as the cause of diseases classified elsewhere: Secondary | ICD-10-CM | POA: Diagnosis not present

## 2024-05-25 DIAGNOSIS — Z1629 Resistance to other single specified antibiotic: Secondary | ICD-10-CM | POA: Diagnosis not present

## 2024-05-25 DIAGNOSIS — Z85828 Personal history of other malignant neoplasm of skin: Secondary | ICD-10-CM

## 2024-05-25 DIAGNOSIS — K631 Perforation of intestine (nontraumatic): Secondary | ICD-10-CM | POA: Diagnosis present

## 2024-05-25 DIAGNOSIS — R198 Other specified symptoms and signs involving the digestive system and abdomen: Secondary | ICD-10-CM

## 2024-05-25 DIAGNOSIS — N9989 Other postprocedural complications and disorders of genitourinary system: Secondary | ICD-10-CM | POA: Diagnosis present

## 2024-05-25 DIAGNOSIS — Z8546 Personal history of malignant neoplasm of prostate: Secondary | ICD-10-CM

## 2024-05-25 DIAGNOSIS — K659 Peritonitis, unspecified: Secondary | ICD-10-CM | POA: Diagnosis not present

## 2024-05-25 DIAGNOSIS — N390 Urinary tract infection, site not specified: Secondary | ICD-10-CM | POA: Diagnosis not present

## 2024-05-25 DIAGNOSIS — Z9889 Other specified postprocedural states: Secondary | ICD-10-CM | POA: Diagnosis present

## 2024-05-25 MED ORDER — CALCIUM POLYCARBOPHIL 625 MG PO TABS
625.0000 mg | ORAL_TABLET | Freq: Two times a day (BID) | ORAL | Status: DC
  Administered 2024-05-26 – 2024-05-27 (×4): 625 mg via ORAL
  Filled 2024-05-25 (×5): qty 1

## 2024-05-25 MED ORDER — INSULIN ASPART 100 UNIT/ML IJ SOLN: 0.0000 [IU] | Freq: Three times a day (TID) | INTRAMUSCULAR | Status: DC

## 2024-05-25 MED ORDER — ROSUVASTATIN CALCIUM 5 MG PO TABS
10.0000 mg | ORAL_TABLET | Freq: Every evening | ORAL | Status: DC
  Administered 2024-05-26: 10 mg via ORAL
  Filled 2024-05-25: qty 2

## 2024-05-25 MED ORDER — SODIUM CHLORIDE 0.9% FLUSH
3.0000 mL | Freq: Two times a day (BID) | INTRAVENOUS | Status: DC
  Administered 2024-05-26 – 2024-05-27 (×4): 3 mL via INTRAVENOUS

## 2024-05-25 MED ORDER — METOCLOPRAMIDE HCL 5 MG PO TABS: 5.0000 mg | ORAL_TABLET | Freq: Three times a day (TID) | ORAL | Status: DC | PRN

## 2024-05-25 MED ORDER — ALBUTEROL SULFATE (2.5 MG/3ML) 0.083% IN NEBU: 2.5000 mg | INHALATION_SOLUTION | RESPIRATORY_TRACT | Status: DC | PRN

## 2024-05-25 MED ORDER — POLYETHYLENE GLYCOL 3350 17 G PO PACK: 17.0000 g | PACK | Freq: Every day | ORAL | Status: DC | PRN

## 2024-05-25 MED ORDER — LACTATED RINGERS IV SOLN: INTRAVENOUS | Status: DC

## 2024-05-25 MED ORDER — ALBUMIN HUMAN 25 % IV SOLN
12.5000 g | Freq: Once | INTRAVENOUS | Status: AC
  Administered 2024-05-26: 12.5 g via INTRAVENOUS
  Filled 2024-05-25: qty 50

## 2024-05-25 MED ORDER — ACETAMINOPHEN 650 MG RE SUPP: 650.0000 mg | Freq: Four times a day (QID) | RECTAL | Status: DC | PRN

## 2024-05-25 MED ORDER — LOPERAMIDE HCL 2 MG PO CAPS
2.0000 mg | ORAL_CAPSULE | Freq: Two times a day (BID) | ORAL | Status: DC
  Administered 2024-05-26: 2 mg via ORAL
  Filled 2024-05-25: qty 1

## 2024-05-25 MED ORDER — INSULIN ASPART 100 UNIT/ML IJ SOLN: 0.0000 [IU] | Freq: Every day | INTRAMUSCULAR | Status: DC

## 2024-05-25 MED ORDER — OXYCODONE-ACETAMINOPHEN 5-325 MG PO TABS
1.0000 | ORAL_TABLET | Freq: Four times a day (QID) | ORAL | Status: DC | PRN
  Administered 2024-05-26: 1 via ORAL
  Filled 2024-05-25: qty 1

## 2024-05-25 MED ORDER — HYDROXYZINE HCL 25 MG PO TABS: 25.0000 mg | ORAL_TABLET | Freq: Three times a day (TID) | ORAL | Status: DC | PRN

## 2024-05-25 MED ORDER — ACETAMINOPHEN 325 MG PO TABS: 650.0000 mg | ORAL_TABLET | Freq: Four times a day (QID) | ORAL | Status: DC | PRN

## 2024-05-25 NOTE — H&P (Signed)
 History and Physical    MACKAY Thompson FMW:[id number] DOB: 19-Aug-1951 DOA: 05/25/2024  PCP: [name], MD  Patient coming from: SNF  I have personally briefly reviewed patient's old medical records in [facility]  Chief Complaint: needing hire level of care  HPI: Kristopher Thompson is a 73 y.o. male with medical history significant of  HTN, chronic diastolic congestive heart failure, CAD, DM2, pulmonary embolism on Eliquis , anemia, obesity, prostate cancer, bladder cancer who was initially admitted by the general surgery service on 5/30 for operative management regarding parastomal and incisional incarcerated abdominal wall hernias; requiring extensive lysis, mesh insertion, repair, urostomy ileal conduit revision thereafter admitted to the ICU. Patient course was complicated by perforated bowel  and thereafter postop shock , as well as AKI requiring HD insetting of b/l hydronephrosis with obstruction s/p b/l PCN with last exchanged 7/2 of the right PCN. Patient stabilized and was discharged to SNF on 7/11  and now return from since due to need for hire level of care. Per patient while at rehab he continued to have increase output of ostomy. He states at [facility] due to increase output they requested patient be return to hospital for evaluation. Due to this patient was directly admitted.  Of note patient states he does not want to return to rehab as he feels he did not get adequate care there.  He  currently notes no pain currently, sob/chest pain, does have nausea, but states no emesis. He notes he is tolerating his diet. He noted decrease intake due to nausea however. ED Course N/A  Review of Systems: As per HPI otherwise 10 point review of systems negative.   Past Medical History:  Diagnosis Date   At risk for sleep apnea    12-25-2017   STOP-BANG SCORE= 5   --- SENT TO PCP   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   moderate atypia - right low back   Atypical nevus 04/04/2007   moderate to  marked - right upper back (wider shave)   Atypical nevus 04/04/2007   moderate atypia - center chest (wider shave)   Atypical nevus 04/04/2007   slight atypia - right thigh   Atypical nevus 11/29/2011   mild atypia - center upper back   Atypical nevus 11/29/2011   mild atypia - center chest   Bacteremia due to Klebsiella pneumoniae 10/09/2017   Bladder cancer (HCC) dx 07/2017   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  s/p  cystectomy w/ ileal conduit urinary diversion   Candida infection    CHF (congestive heart failure) (HCC)    Colostomy in place (HCC)    since 08-08-2017-- per pt 12-25-2017 reddness around stoma   Diabetes mellitus without complication (HCC)    GERD (gastroesophageal reflux disease)    H/O hiatal hernia    History of sepsis 09/2017   dx bacteremia due to klebsiella pneumoniae,  post op intraabdominal abscess   Prostate cancer [facility]) urologist-- dr renda   10-02-2012 s/p  prostatectomy-- Stage T1c   RBBB    Renal disorder    pt. denies   Sleep apnea    cpap   Squamous cell carcinoma of skin 05/22/2013   left cheek - CX3 + 5FU   Wears glasses     Past Surgical History:  Procedure Laterality Date   ABDOMINAL SURGERY     APPENDECTOMY  1972   BOWEL RESECTION N/A 04/20/2024   Procedure: SMALL BOWEL RESECTION;  Surgeon: [name], MD;  Location: [facility];  Service: General;  Laterality: N/A;   CHOLECYSTECTOMY  1985   COLONOSCOPY N/A 06/29/2021   Procedure: COLONOSCOPY;  Surgeon: Debby Hila, MD;  [facility];  Service: Endoscopy;  Laterality: N/A;   COLOSTOMY REVERSAL N/A 01/08/2018   Procedure: COLOSTOMY REVERSAL;  Surgeon: Debby Hila, MD;  [facility];  Service: General;  Laterality: N/A;   CYSTOSCOPY WITH RETROGRADE PYELOGRAM, URETEROSCOPY AND STENT PLACEMENT Right 06/10/2017   Procedure: CYSTOSCOPY WITH RIGHT URETEROSCOPY WITH RIGHT STENT PLACEMENT;  Surgeon: [name], MD;  [facility];  Service: Urology;  Laterality: Right;    EUS N/A 04/16/2018   Procedure: FULL UPPER ENDOSCOPIC ULTRASOUND (EUS) RADIAL;  Surgeon: Burnette Fallow, MD;  [facility];  Service: Endoscopy;  Laterality: N/A;   FLEXIBLE SIGMOIDOSCOPY N/A 12/13/2017   Procedure: FLEXIBLE SIGMOIDOSCOPY;  Surgeon: Debby Hila, MD;  [facility];  Service: Endoscopy;  Laterality: N/A;   ILEO CONDUIT     IR CATHETER TUBE CHANGE  12/13/2017   IR CATHETER TUBE CHANGE  01/17/2018   IR CATHETER TUBE CHANGE  02/28/2018   IR CATHETER TUBE CHANGE  07/18/2018   IR CATHETER TUBE CHANGE  08/22/2018   IR CATHETER TUBE CHANGE  10/31/2018   IR CONVERT LEFT NEPHROSTOMY TO NEPHROURETERAL CATH  10/24/2017   IR EXT NEPHROURETERAL CATH EXCHANGE  05/19/2018   IR EXT NEPHROURETERAL CATH EXCHANGE  06/16/2018   IR NEPHRO TUBE REMOV/FL  10/24/2017   IR NEPHROSTOGRAM LEFT THRU EXISTING ACCESS  12/05/2018   IR NEPHROSTOMY EXCHANGE LEFT  05/11/2024   IR NEPHROSTOMY PLACEMENT LEFT  10/07/2017   IR NEPHROSTOMY PLACEMENT RIGHT  05/04/2024   IR NEPHROSTOMY TUBE CHANGE  04/11/2018   IR URETERAL STENT PLACEMENT EXISTING ACCESS LEFT  05/13/2024   IR URETERAL STENT RIGHT NEW ACCESS W/SEP NEPHROSTOMY CATH  05/13/2024   LAPAROTOMY N/A 04/20/2024   Procedure: EXPLORATORY LAPAROTOMY;  Surgeon: [name], MD;  [facility];  Service: General;  Laterality: N/A;   PARTIAL COLECTOMY N/A 04/21/2024   Procedure: COLECTOMY, PARTIAL;  Surgeon: [name], MD;  [facility];  Service: General;  Laterality: N/A;  Removal Wound Vac, Washout Ostomy, Possible Anastomosis, Possible Ileostomy.  Phasix Mesh.   POLYPECTOMY  06/29/2021   Procedure: POLYPECTOMY;  Surgeon: Debby Hila, MD;  [facility];  Service: Endoscopy;;   ROBOT ASSISTED LAPAROSCOPIC RADICAL PROSTATECTOMY  10/02/2012   Procedure: ROBOTIC ASSISTED LAPAROSCOPIC RADICAL PROSTATECTOMY LEVEL 2;  Surgeon: Noretta Renda, MD;  [facility];  Service: Urology;  Laterality: N/A;   ROBOTIC ASSISTED LAPAROSCOPIC BLADDER  DIVERTICULECTOMY N/A 08/08/2017   Procedure: XI ROBOTIC ASSISTED LAPAROSCOPIC RADICAL CYSTECTOMY COVERTED TO OPEN PELVIC LYMPHADNECTOMY BILATERAL AND ILEAL CONDUIT URINARY DIVERSION;  Surgeon: [name], MD;  [facility];  Service: Urology;  Laterality: N/A;   TRANSURETHRAL RESECTION OF BLADDER TUMOR  06/10/2017   Procedure: TRANSURETHRAL RESECTION OF BLADDER TUMOR (TURBT);  Surgeon: [name], MD;  [facility];  Service: Urology;;   VACUUM ASSISTED CLOSURE CHANGE N/A 04/20/2024   Procedure: PLACEMENT OF CONCETTA CAPES;  Surgeon: [name], MD;  [facility];  Service: General;  Laterality: N/A;   VENTRAL HERNIA REPAIR N/A 04/10/2024   Procedure: REPAIR, HERNIA, VENTRAL;  Surgeon: [name], MD;  [facility];  Service: General;  Laterality: N/A;   XI ROBOT ABDOMINAL PERINEAL RESECTION N/A 08/08/2017   Procedure: REPAIR OF RECTAL TEAR POSSIBLE PARTIAL PROCTECTOMY, CREATION OF  OSTOMY;  Surgeon: Debby Hila, MD;  [facility];  Service: General;  Laterality: N/A;  XI ROBOTIC ASSISTED PARASTOMAL HERNIA REPAIR N/A 04/10/2024   Procedure: REPAIR, HERNIA, PARASTOMAL AND VENTRAL HERNIAS, ROBOT-ASSISTED, LEFT INGUINAL HERNIA, LYSIS OF ADHESIONS INCARCERATED AND INSIONAL HERNIAS AND UROSTOMY REVISION;  Surgeon: [name], MD;  [facility];  Service: General;  Laterality: N/A;     reports that he quit smoking about 44 years ago. His smoking use included cigarettes. He started smoking about 53 years ago. He has never used smokeless tobacco. He reports current alcohol  use. He reports that he does not use drugs.  Allergies  Allergen Reactions   Demerol  [Meperidine ] Nausea And Vomiting and Other (See Comments)    SEVERE NAUSEA    Family History  Problem Relation Age of Onset   Lung cancer Mother    Hypertension Father    Colon cancer Other    CAD Neg Hx    Diabetes Neg Hx    Stroke Neg Hx     Prior to Admission medications   Medication Sig Start Date End  Date Taking? Authorizing Provider  acetaminophen  (TYLENOL ) 500 MG tablet Take 1,000 mg by mouth every 8 (eight) hours as needed for mild pain or headache.     [provider]  bisacodyl  (DULCOLAX) 5 MG EC tablet Take 2 tablets (10 mg total) by mouth daily as needed for moderate constipation. Patient taking differently: Take 15 mg by mouth daily. 12/09/18   Sebastian Toribio GAILS, MD  Cyanocobalamin  (VITAMIN B-12 PO) Place 1 tablet under the tongue 3 (three) times a week.    [provider]  cyclobenzaprine  (FLEXERIL ) 5 MG tablet Take 1 tablet (5 mg total) by mouth 3 (three) times daily as needed for muscle spasms. 04/10/24   [name], MD  D-MANNOSE PO Take 2-3 tablets by mouth See admin instructions. Take 2 tablets by mouth every morning & take 1 tablet by mouth at bedtime.    [provider]  diphenhydrAMINE  (BENADRYL ) 25 mg capsule Take 25 mg by mouth every other day as needed (congestion/allergies (every other night)).    [provider]  ELIQUIS  5 MG TABS tablet Take 5 mg by mouth 2 (two) times daily.    [provider]  feeding supplement (ENSURE PLUS HIGH PROTEIN) LIQD Take 237 mLs by mouth 2 (two) times daily between meals. 05/22/24   Sherrill Cable Latif, DO  hydrOXYzine  (ATARAX ) 25 MG tablet Take 1 tablet (25 mg total) by mouth 3 (three) times daily as needed for anxiety. 05/22/24   Sheikh, Omair Latif, DO  ipratropium (ATROVENT ) 0.03 % nasal spray Place 1 spray into both nostrils 3 (three) times daily as needed (congestion).    [provider]  loperamide  (IMODIUM ) 2 MG capsule Take 1 capsule (2 mg total) by mouth 2 (two) times daily. 05/22/24   [name], MD  metFORMIN (GLUCOPHAGE) 500 MG tablet Take 500 mg by mouth in the morning. 01/29/24   [provider]  metoCLOPramide  (REGLAN ) 5 MG tablet Take 1 tablet (5 mg total) by mouth every 8 (eight) hours as needed for nausea. 05/20/24   [name], MD  Multiple Vitamins-Minerals  (CENTRUM MINIS ADULTS 50+) TABS Take 1 tablet by mouth See admin instructions. Take 1 tablet by mouth twice daily on every other day.    [provider]  nutrition supplement, JUVEN, (JUVEN) PACK Take 1 packet by mouth 2 (two) times daily between meals. 05/22/24   Sherrill Cable Latif, DO  Nutritional Supplements (,FEEDING SUPPLEMENT, PROSOURCE PLUS) liquid Take 30 mLs by mouth 2 (two) times daily  between meals. 05/22/24   Sheikh, Omair Latif, DO  Omega-3 Fatty Acids (FISH OIL PO) Take 1 capsule by mouth 3 (three) times a week.    [provider]  ondansetron  (ZOFRAN ) 4 MG tablet Take 1 tablet (4 mg total) by mouth daily as needed for nausea or vomiting. 05/22/24 05/22/25  Sherrill Cable Latif, DO  oxyCODONE -acetaminophen  (PERCOCET) 5-325 MG tablet Take 1 tablet by mouth every 6 (six) hours as needed for severe pain (pain score 7-10). Can increase to 2 pills at a time if needed 05/22/24   Sheikh, Omair Latif, DO  polycarbophil (FIBERCON) 625 MG tablet Take 1 tablet (625 mg total) by mouth 2 (two) times daily. 05/20/24   [name], MD  polyethylene glycol (MIRALAX ) 17 g packet Take 17 g by mouth daily as needed (constipation.).    [provider]  rosuvastatin  (CRESTOR ) 10 MG tablet Take 10 mg by mouth every evening.    [provider]  sodium chloride  (OCEAN) 0.65 % SOLN nasal spray Place 1-2 sprays into both nostrils every 6 (six) hours as needed for congestion (dry nose). 05/22/24   Sheikh, Omair Latif, DO  triamcinolone  cream (KENALOG ) 0.1 % Apply 1 application topically daily as needed. 10/17/21   [name], MD    Physical Exam: Afeb, bp 97/70, rr 18, sat 100% on ra   Constitutional: NAD, calm, comfortable Eyes: pupils equal, lids and conjunctivae normal ENMT: Mucous membranes are dry.  Respiratory: clear to auscultation bilaterally, no wheezing, no crackles. Normal respiratory effort. No accessory muscle use.  Cardiovascular: Regular rate and rhythm, no  murmurs / rubs / gallops. No extremity edema. 2+ pedal pulses.  Abdomen: appropriate incisional  tenderness, pain are area of ostomy on right, no masses palpated. . Bowel sounds positive.  Musculoskeletal: no clubbing / cyanosis. No joint deformity upper and lower extremities. Good ROM, no contractures. Normal muscle tone.  Skin: area of dermatitis around ostomy,due to excessive drainage on skin  Neurologic: CN grossly intact. Sensation intact,  Strength 5/5 in all 4.  Psychiatric: Normal judgment and insight. Alert and oriented x 3. Normal mood.    Labs on Admission: I have personally reviewed following labs and imaging studies  CBC: Recent Labs  Lab 05/20/24 0845 05/21/24 0651 05/22/24 0630  WBC 10.3 10.0 10.1  NEUTROABS 6.6 5.6 6.0  HGB 13.7 13.8 13.9  HCT 40.1 41.4 40.9  MCV 89.3 89.2 88.7  PLT 316 331 326   Basic Metabolic Panel: Recent Labs  Lab 05/19/24 0600 05/20/24 0354 05/21/24 0650 05/21/24 0651 05/22/24 0630  NA 129* 127* 127* 126* 127*  K 3.6 3.8 4.0 4.0 3.9  CL 102 99 96* 97* 97*  CO2 17* 17* 17* 18* 17*  GLUCOSE 95 104* 99 100* 108*  BUN 62* 69* 85* 84* 91*  CREATININE 1.74* 1.94* 2.10* 2.14* 2.09*  CALCIUM  10.0 10.0 10.3 10.3 10.2  MG  --   --   --  2.4 2.5*  PHOS 4.6 4.8* 5.6* 5.5* 5.4*   GFR: CrCl cannot be calculated (Unknown ideal weight.). Liver Function Tests: Recent Labs  Lab 05/19/24 0600 05/20/24 0354 05/21/24 0650 05/21/24 0651 05/22/24 0630  AST  --   --   --  53* 54*  ALT  --   --   --  93* 98*  ALKPHOS  --   --   --  86 89  BILITOT  --   --   --  0.7 0.8  PROT  --   --   --  7.2 7.2  ALBUMIN  3.1* 3.2* 3.1* 3.1* 3.3*   No results for input(s): LIPASE, AMYLASE in the last 168 hours. No results for input(s): AMMONIA in the last 168 hours. Coagulation Profile: No results for input(s): INR, PROTIME in the last 168 hours. Cardiac Enzymes: No results for input(s): CKTOTAL, CKMB, CKMBINDEX, TROPONINI in the last  168 hours. BNP (last 3 results) No results for input(s): PROBNP in the last 8760 hours. HbA1C: No results for input(s): HGBA1C in the last 72 hours. CBG: Recent Labs  Lab 05/21/24 1645 05/21/24 2112 05/22/24 0812 05/22/24 1128 05/22/24 1722  GLUCAP 147* 140* 103* 145* 120*   Lipid Profile: No results for input(s): CHOL, HDL, LDLCALC, TRIG, CHOLHDL, LDLDIRECT in the last 72 hours. Thyroid  Function Tests: No results for input(s): TSH, T4TOTAL, FREET4, T3FREE, THYROIDAB in the last 72 hours. Anemia Panel: No results for input(s): VITAMINB12, FOLATE, FERRITIN, TIBC, IRON , RETICCTPCT in the last 72 hours. Urine analysis:    Component Value Date/Time   COLORURINE YELLOW 04/13/2024 1018   APPEARANCEUR CLOUDY (A) 04/13/2024 1018   LABSPEC 1.016 04/13/2024 1018   PHURINE 6.0 04/13/2024 1018   GLUCOSEU NEGATIVE 04/13/2024 1018   HGBUR LARGE (A) 04/13/2024 1018   BILIRUBINUR NEGATIVE 04/13/2024 1018   KETONESUR 5 (A) 04/13/2024 1018   PROTEINUR 100 (A) 04/13/2024 1018   NITRITE NEGATIVE 04/13/2024 1018   LEUKOCYTESUR LARGE (A) 04/13/2024 1018    Radiological Exams on Admission: Cxr:NAD  EKG:N/a  Assessment/Plan  High Output Ileostomy -continue on loperamide   - start protonix  -continue on bulking agents  - noted skin irritation due to leakage around ostomy bag/ wound care consult placed  - IV hydration due to mild dehydration   Acute on Chronic Hyponatremia  - due to mild hypovolemia  -gently ivf hydration  -monitor NA      Incisional/parastomal, left inguinal incarcerated hernia s/p robotic hernia repair c/b Septic shock secondary to acute peritonitis from bowel perforation s/p repair  POA --per prior surgery recs wound VAC changes twice weekly Monday/Thursday -continue regular diet.   - ileostomy output -bowel regimen has been initiated and has been given soluble Polly fiber/FiberCon to thicken  added loperamide  to help regular  output. However still has very loose stools.with area of inflammation area surrounding ostomy due thin/liquid stool leaking around dressing.  -will add cholestyramine to regimen    Bilateral Hydronephrosis/Obstruction Urine leak w/ Hx prior radical cystectomy/ileal conduit urinary diversion  POA - s/p PCN  -renal function stable  -f/u with urology outpatient as previously organized   DMII -iss/fs  -A1c 5.3 -resume [place]  [address] [place]  -on metformin as outpatient hold for now  Anemia , macrocytic  - stable h/h  - check b12,folate   Chronic Diastolic Congestive Heart Failure  -no acute exacerbation  -not on  Hx of Pulmonary Emboli  -continue on Eliquis    Debility /Weakness /deconditioning  -s/p prolonged hospitalization -was discharged to snf but transferred back to due to needing hire level of care   Moderate Protein Calorie Malnutrition  - nutrition consult  -tolerating regular diet intermittently due to nausea but no emesis  - on supplemental tube feeds with prosource, juven    DVT prophylaxis: Eliquis  Code Status:full/ as discussed per patient wishes in event of cardiac arrest  Family Communication: none at bedside Disposition Plan: full/ as discussed per patient wishes in event of cardiac arrest  Consults called: will need case work for placement Admission status: cardiac tele   Camila DELENA Ned MD [facility]  If 7PM-7AM, please contact night-coverage [email or web address] Password [facility]  05/25/2024, 10:18 PM

## 2024-05-26 ENCOUNTER — Ambulatory Visit (HOSPITAL_COMMUNITY): Admitting: Nurse Practitioner

## 2024-05-26 ENCOUNTER — Other Ambulatory Visit: Payer: Self-pay

## 2024-05-26 DIAGNOSIS — L24B3 Irritant contact dermatitis related to fecal or urinary stoma or fistula: Secondary | ICD-10-CM | POA: Diagnosis not present

## 2024-05-26 DIAGNOSIS — R198 Other specified symptoms and signs involving the digestive system and abdomen: Secondary | ICD-10-CM

## 2024-05-26 DIAGNOSIS — N1832 Chronic kidney disease, stage 3b: Secondary | ICD-10-CM | POA: Insufficient documentation

## 2024-05-26 LAB — TSH: TSH: 1.457 u[IU]/mL (ref 0.350–4.500)

## 2024-05-26 LAB — PROCALCITONIN: Procalcitonin: <0.1 ng/mL

## 2024-05-26 LAB — LACTIC ACID, PLASMA
Lactic Acid, Venous: 1.5 mmol/L (ref 0.5–1.9)
Lactic Acid, Venous: 1.4 mmol/L (ref 0.5–1.9)

## 2024-05-26 LAB — CBC
HCT: 39.2 % (ref 39.0–52.0)
Hemoglobin: 13.4 g/dL (ref 13.0–17.0)
MCH: 29.6 pg (ref 26.0–34.0)
MCHC: 34.2 g/dL (ref 30.0–36.0)
MCV: 86.7 fL (ref 80.0–100.0)
Platelets: 297 K/uL (ref 150–400)
RBC: 4.52 MIL/uL (ref 4.22–5.81)
RDW: 15.2 % (ref 11.5–15.5)
WBC: 12.6 K/uL — ABNORMAL HIGH (ref 4.0–10.5)
nRBC: 0 % (ref 0.0–0.2)

## 2024-05-26 LAB — BLOOD GAS, VENOUS
Acid-base deficit: 10.1 mmol/L — ABNORMAL HIGH (ref 0.0–2.0)
Bicarbonate: 14.2 mmol/L — ABNORMAL LOW (ref 20.0–28.0)
O2 Saturation: 90.5 %
Patient temperature: 36.7
pCO2, Ven: 27 mmHg — ABNORMAL LOW (ref 44–60)
pH, Ven: 7.33 (ref 7.25–7.43)
pO2, Ven: 60 mmHg — ABNORMAL HIGH (ref 32–45)

## 2024-05-26 LAB — COMPREHENSIVE METABOLIC PANEL WITH GFR
ALT: 81 U/L — ABNORMAL HIGH (ref 0–44)
ALT: 72 U/L — ABNORMAL HIGH (ref 0–44)
AST: 42 U/L — ABNORMAL HIGH (ref 15–41)
AST: 34 U/L (ref 15–41)
Albumin: 3.1 g/dL — ABNORMAL LOW (ref 3.5–5.0)
Albumin: 3.3 g/dL — ABNORMAL LOW (ref 3.5–5.0)
Alkaline Phosphatase: 96 U/L (ref 38–126)
Alkaline Phosphatase: 89 U/L (ref 38–126)
Anion gap: 13 (ref 5–15)
Anion gap: 15 (ref 5–15)
BUN: 93 mg/dL — ABNORMAL HIGH (ref 8–23)
BUN: 94 mg/dL — ABNORMAL HIGH (ref 8–23)
CO2: 14 mmol/L — ABNORMAL LOW (ref 22–32)
CO2: 13 mmol/L — ABNORMAL LOW (ref 22–32)
Calcium: 9.8 mg/dL (ref 8.9–10.3)
Calcium: 9.6 mg/dL (ref 8.9–10.3)
Chloride: 98 mmol/L (ref 98–111)
Chloride: 97 mmol/L — ABNORMAL LOW (ref 98–111)
Creatinine, Ser: 2.24 mg/dL — ABNORMAL HIGH (ref 0.61–1.24)
Creatinine, Ser: 2.31 mg/dL — ABNORMAL HIGH (ref 0.61–1.24)
GFR, Estimated: 31 mL/min — ABNORMAL LOW (ref >60)
GFR, Estimated: 29 mL/min — ABNORMAL LOW (ref >60)
Glucose, Bld: 114 mg/dL — ABNORMAL HIGH (ref 70–99)
Glucose, Bld: 111 mg/dL — ABNORMAL HIGH (ref 70–99)
Potassium: 4.5 mmol/L (ref 3.5–5.1)
Potassium: 4.4 mmol/L (ref 3.5–5.1)
Sodium: 125 mmol/L — ABNORMAL LOW (ref 135–145)
Sodium: 125 mmol/L — ABNORMAL LOW (ref 135–145)
Total Bilirubin: 0.4 mg/dL (ref 0.0–1.2)
Total Bilirubin: 0.8 mg/dL (ref 0.0–1.2)
Total Protein: 6.9 g/dL (ref 6.5–8.1)
Total Protein: 6.7 g/dL (ref 6.5–8.1)

## 2024-05-26 LAB — CBC WITH DIFFERENTIAL/PLATELET
Abs Immature Granulocytes: 0.19 K/uL — ABNORMAL HIGH (ref 0.00–0.07)
Basophils Absolute: 0.2 K/uL — ABNORMAL HIGH (ref 0.0–0.1)
Basophils Relative: 2 %
Eosinophils Absolute: 0.5 K/uL (ref 0.0–0.5)
Eosinophils Relative: 4 %
HCT: 45.1 % (ref 39.0–52.0)
Hemoglobin: 14.9 g/dL (ref 13.0–17.0)
Immature Granulocytes: 2 %
Lymphocytes Relative: 17 %
Lymphs Abs: 2 K/uL (ref 0.7–4.0)
MCH: 29.8 pg (ref 26.0–34.0)
MCHC: 33 g/dL (ref 30.0–36.0)
MCV: 90.2 fL (ref 80.0–100.0)
Monocytes Absolute: 1.6 K/uL — ABNORMAL HIGH (ref 0.1–1.0)
Monocytes Relative: 13 %
Neutro Abs: 7.5 K/uL (ref 1.7–7.7)
Neutrophils Relative %: 62 %
Platelets: 259 K/uL (ref 150–400)
RBC: 5 MIL/uL (ref 4.22–5.81)
RDW: 15.3 % (ref 11.5–15.5)
WBC: 11.9 K/uL — ABNORMAL HIGH (ref 4.0–10.5)
nRBC: 0 % (ref 0.0–0.2)

## 2024-05-26 LAB — C-REACTIVE PROTEIN: CRP: 1.1 mg/dL — ABNORMAL HIGH (ref <1.0)

## 2024-05-26 LAB — GLUCOSE, CAPILLARY
Glucose-Capillary: 109 mg/dL — ABNORMAL HIGH (ref 70–99)
Glucose-Capillary: 116 mg/dL — ABNORMAL HIGH (ref 70–99)
Glucose-Capillary: 118 mg/dL — ABNORMAL HIGH (ref 70–99)
Glucose-Capillary: 118 mg/dL — ABNORMAL HIGH (ref 70–99)
Glucose-Capillary: 142 mg/dL — ABNORMAL HIGH (ref 70–99)

## 2024-05-26 LAB — PROTIME-INR
INR: 1.2 (ref 0.8–1.2)
Prothrombin Time: 16.3 s — ABNORMAL HIGH (ref 11.4–15.2)

## 2024-05-26 LAB — PHOSPHORUS: Phosphorus: 5.2 mg/dL — ABNORMAL HIGH (ref 2.5–4.6)

## 2024-05-26 LAB — CREATININE, FLUID (PLEURAL, PERITONEAL, JP DRAINAGE): Creat, Fluid: 2.1 mg/dL

## 2024-05-26 LAB — BRAIN NATRIURETIC PEPTIDE: B Natriuretic Peptide: 24.6 pg/mL (ref 0.0–100.0)

## 2024-05-26 LAB — MAGNESIUM: Magnesium: 2.4 mg/dL (ref 1.7–2.4)

## 2024-05-26 MED ORDER — LACTATED RINGERS IV BOLUS: 1000.0000 mL | Freq: Three times a day (TID) | INTRAVENOUS | Status: DC | PRN

## 2024-05-26 MED ORDER — APIXABAN 5 MG PO TABS
5.0000 mg | ORAL_TABLET | Freq: Two times a day (BID) | ORAL | Status: DC
  Administered 2024-05-26 – 2024-05-27 (×3): 5 mg via ORAL
  Filled 2024-05-26 (×2): qty 1
  Filled 2024-05-26: qty 2

## 2024-05-26 MED ORDER — METOCLOPRAMIDE HCL 5 MG PO TABS: 5.0000 mg | ORAL_TABLET | Freq: Three times a day (TID) | ORAL | Status: DC | PRN

## 2024-05-26 MED ORDER — LOPERAMIDE HCL 2 MG PO CAPS
4.0000 mg | ORAL_CAPSULE | Freq: Two times a day (BID) | ORAL | Status: DC
  Administered 2024-05-26 – 2024-05-27 (×3): 4 mg via ORAL
  Filled 2024-05-26 (×3): qty 2

## 2024-05-26 MED ORDER — SODIUM CHLORIDE 0.9 % IV SOLN: INTRAVENOUS | Status: DC

## 2024-05-26 MED ORDER — MORPHINE SULFATE (PF) 2 MG/ML IV SOLN
2.0000 mg | Freq: Once | INTRAVENOUS | Status: DC
  Filled 2024-05-26: qty 1

## 2024-05-26 MED ORDER — LOPERAMIDE HCL 2 MG PO CAPS: 2.0000 mg | ORAL_CAPSULE | Freq: Four times a day (QID) | ORAL | Status: DC | PRN

## 2024-05-26 MED ORDER — NAPHAZOLINE-GLYCERIN 0.012-0.25 % OP SOLN: 1.0000 [drp] | Freq: Four times a day (QID) | OPHTHALMIC | Status: DC | PRN

## 2024-05-26 MED ORDER — SODIUM CHLORIDE 0.9 % IV BOLUS
1000.0000 mL | Freq: Once | INTRAVENOUS | Status: AC
  Administered 2024-05-26: 1000 mL via INTRAVENOUS

## 2024-05-26 MED ORDER — PHENOL 1.4 % MT LIQD: 2.0000 | OROMUCOSAL | Status: DC | PRN

## 2024-05-26 MED ORDER — SALINE SPRAY 0.65 % NA SOLN: 1.0000 | Freq: Four times a day (QID) | NASAL | Status: DC | PRN

## 2024-05-26 MED ORDER — MENTHOL 3 MG MT LOZG: 1.0000 | LOZENGE | OROMUCOSAL | Status: DC | PRN

## 2024-05-26 MED ORDER — JUVEN PO PACK
1.0000 | PACK | Freq: Two times a day (BID) | ORAL | Status: DC
  Administered 2024-05-26 – 2024-05-27 (×4): 1 via ORAL
  Filled 2024-05-26 (×4): qty 1

## 2024-05-26 MED ORDER — MAGIC MOUTHWASH: 15.0000 mL | Freq: Four times a day (QID) | ORAL | Status: DC | PRN

## 2024-05-26 MED ORDER — PIPERACILLIN-TAZOBACTAM 3.375 G IVPB
3.3750 g | Freq: Three times a day (TID) | INTRAVENOUS | Status: DC
  Administered 2024-05-26 – 2024-05-27 (×4): 3.375 g via INTRAVENOUS
  Filled 2024-05-26 (×4): qty 50

## 2024-05-26 MED ORDER — ENSURE PLUS HIGH PROTEIN PO LIQD
237.0000 mL | Freq: Two times a day (BID) | ORAL | Status: DC
  Administered 2024-05-26 (×2): 237 mL via ORAL

## 2024-05-26 MED ORDER — PROSOURCE PLUS PO LIQD
30.0000 mL | Freq: Two times a day (BID) | ORAL | Status: DC
  Administered 2024-05-26 – 2024-05-27 (×4): 30 mL via ORAL
  Filled 2024-05-26 (×4): qty 30

## 2024-05-26 MED ORDER — OXYCODONE-ACETAMINOPHEN 7.5-325 MG PO TABS
1.0000 | ORAL_TABLET | Freq: Once | ORAL | Status: AC
  Administered 2024-05-26: 1 via ORAL
  Filled 2024-05-26: qty 1

## 2024-05-26 MED ORDER — APIXABAN 2.5 MG PO TABS: 2.5000 mg | ORAL_TABLET | Freq: Two times a day (BID) | ORAL | Status: DC

## 2024-05-26 MED ORDER — MIDODRINE HCL 5 MG PO TABS
5.0000 mg | ORAL_TABLET | Freq: Three times a day (TID) | ORAL | Status: DC
  Administered 2024-05-26 – 2024-05-27 (×3): 5 mg via ORAL
  Filled 2024-05-26 (×3): qty 1

## 2024-05-26 NOTE — Plan of Care (Signed)

## 2024-05-26 NOTE — TOC Initial Note (Signed)
 Transition of Care [facility]) - Initial/Assessment Note    Patient Details  Name: Kristopher Thompson MRN: [id number] Date of Birth: December 09, 1950  [facility] CM/SW Contact:    Sukhmani Fetherolf A [name], LCSW Phone Number: 05/26/2024, 11:59 AM  Clinical Narrative:                  CSW met with pt and pt's wife, Marval at bedside. They confirmed they were from [facility], do not want to go back. Stated facility was fine, but concerns they are not able to manage pt's medical needs and are farther away from the hospital than they would like. Requested CSW reach out to [facility] if possible for admissions.   CSW informed [facility] admissions pt's interest in inpatient rehab.   Discussed back up plan for SNF in even [facility] unable to take pt.    TOC will continue to follow.  Expected Discharge Plan: IP Rehab Facility Barriers to Discharge: Continued Medical Work up, SNF Pending bed offer, Insurance Authorization   Patient Goals and CMS Choice            Expected Discharge Plan and Services                                              Prior Living Arrangements/Services              Need for Family Participation in Patient Care: Yes (Comment) Care giver support system in place?: Yes (comment) (pt's wife)      Activities of Daily Living   ADL Screening (condition at time of admission) Independently performs ADLs?: Yes (appropriate for developmental age) Is the patient deaf or have difficulty hearing?: Yes Does the patient have difficulty seeing, even when wearing glasses/contacts?: Yes Does the patient have difficulty concentrating, remembering, or making decisions?: No  Permission Sought/Granted                  Emotional Assessment Appearance:: Appears stated age Attitude/Demeanor/Rapport:  (normal) Affect (typically observed): Appropriate Orientation: : [name], Oriented to Place, Oriented to  Time, Oriented to Situation Alcohol  / Substance Use: Not  Applicable Psych Involvement: No (comment)  Admission diagnosis:  Sepsis (HCC) [A41.9] Patient Active Problem List   Diagnosis Date Noted   High output ileostomy (HCC) 05/26/2024   Ileostomy in place (HCC) 05/20/2024   H/O insertion of nephrostomy tube 05/20/2024   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 05/11/2024   Delayed bowel perforation s/p SBR/end ileostomy 04/29/2024   Pressure injury of skin 04/29/2024   Sinus tachycardia 04/13/2024   Tachypnea 04/13/2024   Acute respiratory insufficiency, postoperative 04/13/2024   Sepsis due to undetermined organism (HCC) 04/13/2024   Lactic acidosis 04/13/2024   Class 2 obesity 04/13/2024   Incarcerated incisional hernia 04/10/2024   Chronic anticoagulation 03/02/2024   Partial small bowel obstruction (HCC) 03/02/2024   Personal history of PE (pulmonary embolism) 03/02/2024   Irritant contact dermatitis associated with urinary stoma 05/28/2023   Non-recurrent bilateral inguinal hernia without obstruction or gangrene 05/28/2023   Obstructive sleep apnea of adult 11/21/2022   Post-traumatic arthritis of ankle, 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   Hearing loss 07/24/2021   History of bladder cancer 07/24/2021   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   Prolonged QT interval 12/07/2018   AKI (acute kidney injury) (HCC) 06/15/2018   Fever 10/09/2017  GERD (gastroesophageal reflux disease) 08/11/2017   Obesity (BMI 35.0-39.9 without comorbidity) 08/11/2017   S/P ileal conduit (HCC) 08/08/2017   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 08/08/2017 08/08/2017   PCP:  [name], MD Pharmacy:   [facility] [phone] - [place], [place] - [address] AT [facility] OF [address] [facility] [address] [place] [address] Phone: [phone] Fax: [phone]     Social Drivers of Health (SDOH) Social History: SDOH Screenings   Food Insecurity: No Food Insecurity (05/26/2024)   Housing: Low Risk  (05/26/2024)  Transportation Needs: No Transportation Needs (05/26/2024)  Utilities: At Risk (05/26/2024)  Social Connections: Socially Integrated (05/26/2024)  Tobacco Use: Medium Risk (04/10/2024)   SDOH Interventions:     Readmission Risk Interventions    05/26/2024    8:59 AM 04/12/2024    4:58 PM  Readmission Risk Prevention Plan  Transportation Screening Complete Complete  PCP or Specialist Appt within 5-7 Days Complete   PCP or Specialist Appt within 3-5 Days  Complete  Home Care Screening Complete   Medication Review (RN CM) Complete   HRI or Home Care Consult  Complete  Social Work Consult for Recovery Care Planning/Counseling  Complete  Palliative Care Screening  Not Applicable  Medication Review Oceanographer)  Complete

## 2024-05-26 NOTE — Plan of Care (Signed)
 Problem: Education: Goal: Knowledge of General Education information will improve Description: Including pain rating scale, medication(s)/side effects and non-pharmacologic comfort measures 05/26/2024 0706 by Gilberto Tinnie KIDD, RN Outcome: Progressing 05/26/2024 0706 by Gilberto Tinnie KIDD, RN Outcome: Progressing 05/26/2024 0705 by Gilberto Tinnie KIDD, RN Outcome: Progressing   Problem: Health Behavior/Discharge Planning: Goal: Ability to manage health-related needs will improve 05/26/2024 0706 by Gilberto Tinnie KIDD, RN Outcome: Progressing 05/26/2024 0706 by Gilberto Tinnie KIDD, RN Outcome: Progressing 05/26/2024 0705 by Gilberto Tinnie KIDD, RN Outcome: Progressing   Problem: Clinical Measurements: Goal: Ability to maintain clinical measurements within normal limits will improve 05/26/2024 0706 by Gilberto Tinnie KIDD, RN Outcome: Progressing 05/26/2024 0706 by Gilberto Tinnie KIDD, RN Outcome: Progressing 05/26/2024 0705 by Gilberto Tinnie KIDD, RN Outcome: Progressing Goal: Will remain free from infection 05/26/2024 0706 by Gilberto Tinnie KIDD, RN Outcome: Progressing 05/26/2024 0706 by Gilberto Tinnie KIDD, RN Outcome: Progressing 05/26/2024 0705 by Gilberto Tinnie KIDD, RN Outcome: Progressing Goal: Diagnostic test results will improve 05/26/2024 0706 by Gilberto Tinnie KIDD, RN Outcome: Progressing 05/26/2024 0706 by Gilberto Tinnie KIDD, RN Outcome: Progressing 05/26/2024 0705 by Gilberto Tinnie KIDD, RN Outcome: Progressing Goal: Respiratory complications will improve 05/26/2024 0706 by Gilberto Tinnie KIDD, RN Outcome: Progressing 05/26/2024 0706 by Gilberto Tinnie KIDD, RN Outcome: Progressing 05/26/2024 0705 by Gilberto Tinnie KIDD, RN Outcome: Progressing Goal: Cardiovascular complication will be avoided 05/26/2024 0706 by Gilberto Tinnie KIDD, RN Outcome: Progressing 05/26/2024 0706 by Gilberto Tinnie KIDD, RN Outcome: Progressing 05/26/2024 0705 by Gilberto Tinnie KIDD, RN Outcome: Progressing   Problem:  Activity: Goal: Risk for activity intolerance will decrease 05/26/2024 0706 by Gilberto Tinnie KIDD, RN Outcome: Progressing 05/26/2024 0706 by Gilberto Tinnie KIDD, RN Outcome: Progressing 05/26/2024 0705 by Gilberto Tinnie KIDD, RN Outcome: Progressing   Problem: Nutrition: Goal: Adequate nutrition will be maintained 05/26/2024 0706 by Gilberto Tinnie KIDD, RN Outcome: Progressing 05/26/2024 0706 by Gilberto Tinnie KIDD, RN Outcome: Progressing 05/26/2024 0705 by Gilberto Tinnie KIDD, RN Outcome: Progressing   Problem: Coping: Goal: Level of anxiety will decrease 05/26/2024 0706 by Gilberto Tinnie KIDD, RN Outcome: Progressing 05/26/2024 0706 by Gilberto Tinnie KIDD, RN Outcome: Progressing 05/26/2024 0705 by Gilberto Tinnie KIDD, RN Outcome: Progressing   Problem: Elimination: Goal: Will not experience complications related to bowel motility 05/26/2024 0706 by Gilberto Tinnie KIDD, RN Outcome: Progressing 05/26/2024 0706 by Gilberto Tinnie KIDD, RN Outcome: Progressing 05/26/2024 0705 by Gilberto Tinnie KIDD, RN Outcome: Progressing Goal: Will not experience complications related to urinary retention 05/26/2024 0706 by Gilberto Tinnie KIDD, RN Outcome: Progressing 05/26/2024 0706 by Gilberto Tinnie KIDD, RN Outcome: Progressing 05/26/2024 0705 by Gilberto Tinnie KIDD, RN Outcome: Progressing   Problem: Pain Managment: Goal: General experience of comfort will improve and/or be controlled 05/26/2024 0706 by Gilberto Tinnie KIDD, RN Outcome: Progressing 05/26/2024 0706 by Gilberto Tinnie KIDD, RN Outcome: Progressing 05/26/2024 0705 by Gilberto Tinnie KIDD, RN Outcome: Progressing   Problem: Safety: Goal: Ability to remain free from injury will improve 05/26/2024 0706 by Gilberto Tinnie KIDD, RN Outcome: Progressing 05/26/2024 0706 by Gilberto Tinnie KIDD, RN Outcome: Progressing 05/26/2024 0705 by Gilberto Tinnie KIDD, RN Outcome: Progressing   Problem: Skin Integrity: Goal: Risk for impaired skin integrity will decrease 05/26/2024 0706  by Gilberto Tinnie KIDD, RN Outcome: Progressing 05/26/2024 0706 by Gilberto Tinnie KIDD, RN Outcome: Progressing 05/26/2024 0705 by Gilberto Tinnie KIDD, RN Outcome: Progressing   Problem: Education: Goal: Ability to describe self-care measures that may prevent or decrease complications (Diabetes Survival Skills Education) will improve 05/26/2024 0706 by  Gilberto Tinnie KIDD, RN Outcome: Progressing 05/26/2024 0706 by Gilberto Tinnie KIDD, RN Outcome: Progressing 05/26/2024 0705 by Gilberto Tinnie KIDD, RN Outcome: Progressing Goal: Individualized Educational Video(s) 05/26/2024 0706 by Gilberto Tinnie KIDD, RN Outcome: Progressing 05/26/2024 0706 by Gilberto Tinnie KIDD, RN Outcome: Progressing 05/26/2024 0705 by Gilberto Tinnie KIDD, RN Outcome: Progressing   Problem: Coping: Goal: Ability to adjust to condition or change in health will improve 05/26/2024 0706 by Gilberto Tinnie KIDD, RN Outcome: Progressing 05/26/2024 0706 by Gilberto Tinnie KIDD, RN Outcome: Progressing 05/26/2024 0705 by Gilberto Tinnie KIDD, RN Outcome: Progressing   Problem: Fluid Volume: Goal: Ability to maintain a balanced intake and output will improve 05/26/2024 0706 by Gilberto Tinnie KIDD, RN Outcome: Progressing 05/26/2024 0706 by Gilberto Tinnie KIDD, RN Outcome: Progressing 05/26/2024 0705 by Gilberto Tinnie KIDD, RN Outcome: Progressing   Problem: Health Behavior/Discharge Planning: Goal: Ability to identify and utilize available resources and services will improve 05/26/2024 0706 by Gilberto Tinnie KIDD, RN Outcome: Progressing 05/26/2024 0706 by Gilberto Tinnie KIDD, RN Outcome: Progressing 05/26/2024 0705 by Gilberto Tinnie KIDD, RN Outcome: Progressing Goal: Ability to manage health-related needs will improve 05/26/2024 0706 by Gilberto Tinnie KIDD, RN Outcome: Progressing 05/26/2024 0706 by Gilberto Tinnie KIDD, RN Outcome: Progressing 05/26/2024 0705 by Gilberto Tinnie KIDD, RN Outcome: Progressing   Problem: Metabolic: Goal: Ability to maintain  appropriate glucose levels will improve 05/26/2024 0706 by Gilberto Tinnie KIDD, RN Outcome: Progressing 05/26/2024 0706 by Gilberto Tinnie KIDD, RN Outcome: Progressing 05/26/2024 0705 by Gilberto Tinnie KIDD, RN Outcome: Progressing   Problem: Nutritional: Goal: Maintenance of adequate nutrition will improve 05/26/2024 0706 by Gilberto Tinnie KIDD, RN Outcome: Progressing 05/26/2024 0706 by Gilberto Tinnie KIDD, RN Outcome: Progressing 05/26/2024 0705 by Gilberto Tinnie KIDD, RN Outcome: Progressing Goal: Progress toward achieving an optimal weight will improve 05/26/2024 0706 by Gilberto Tinnie KIDD, RN Outcome: Progressing 05/26/2024 0706 by Gilberto Tinnie KIDD, RN Outcome: Progressing 05/26/2024 0705 by Gilberto Tinnie KIDD, RN Outcome: Progressing   Problem: Skin Integrity: Goal: Risk for impaired skin integrity will decrease 05/26/2024 0706 by Gilberto Tinnie KIDD, RN Outcome: Progressing 05/26/2024 0706 by Gilberto Tinnie KIDD, RN Outcome: Progressing 05/26/2024 0705 by Gilberto Tinnie KIDD, RN Outcome: Progressing   Problem: Tissue Perfusion: Goal: Adequacy of tissue perfusion will improve 05/26/2024 0706 by Gilberto Tinnie KIDD, RN Outcome: Progressing 05/26/2024 0706 by Gilberto Tinnie KIDD, RN Outcome: Progressing 05/26/2024 0705 by Gilberto Tinnie KIDD, RN Outcome: Progressing

## 2024-05-26 NOTE — Consult Note (Signed)
 WOC Nurse Consult Note: Reason for Consult: NPWT to midline abd surgical wound Wound type: surgical Pressure Injury POA: NA Measurement: 16 cm x 6 cm x 3.5 cm  Wound bed: pink, moist, with large amount tan drainage Drainage (amount, consistency, odor) large amount tan drainage noted. Periwound: Intact Dressing procedure/placement/frequency: Removed old NPWT dressing (present from SNF, per patient not removed or changed upon transfer to hospital- 3 pieces black foam) Cleansed wound with normal saline  Filled wound with   __1__ piece of black foam, barrier ring utilized at umbilicus and inferior aspect of wound to achieve seal.  Sealed NPWT dressing at HG  Patient tolerated procedure well  WOC nurse will continue to provide NPWT dressing changed due to the complexity of the dressing change.   WOC Nurse ostomy consult note Stoma type/location: RLQ ileal conduit, RUQ ileostomy  Stomal assessment/size:  RLQ ileal conduit: oval 1 in x 1 1/4 inch, flush with skin, in valley RUQ ileostomy: 1 1/4 inch oval, abd creases at 3 o'clock and 9 o'clock Peristomal assessment: erythema and denudation around RUQ ileostomy, skin intact around ileal conduit Treatment options for stomal/peristomal skin: ostomy powder utilized for crusting technique, 2 inch barrier ring utilized around both stomas Output  yellow urine Liquid brown stool, large volume of output (patient reports started taking imodium  today) Ostomy pouching:   1pc convex urostomy pouch Soila [phone]) with 2 barrier rings Soila # [phone]),  1pc soft convex ileostomy Soila # [phone])   Education provided: discussed use of ostomy powder for crusting technique and use of barrier rings to help achieve seal.  Spouse present at bedside with samples of barrier extenders, one sample used around ileostomy skin barrier to promote adherence.  Enrolled patient in [employer] DC program: Yes- previous admission.   WOC team will continue  to provide NPWT dressing changed due to the complexity of the dressing change and ostomy teaching as needed.   Doyal Polite, RN, MSN, [facility] WOC Team

## 2024-05-26 NOTE — Progress Notes (Signed)
 [facility] Progress Note Patient: Kristopher Thompson FMW:[id number] DOB: Apr 23, 1951 DOA: 05/25/2024  DOS: the patient was seen and examined on 05/26/2024  Brief Hospital Course: Patient with PMH of HTN, HFpEF, CAD, DM2, PE on Eliquis , obesity, prostate cancer, bladder cancer presents to the hospital for treatment of parastomal and incisional incarcerated hernia.  Underwent robotic LOA hernia repair with mesh.  Also underwent urostomy with ileal conduit creation revision on 5/30. Had a prolonged stay in the hospital and was discharged on 7/11. After reaching to the SNF continues to report nausea and vomiting with minimal oral intake.  And then started having difficulty with his ileostomy pouch placement. He was using to soak up the output from the ileostomy he was becoming fatigued and lethargic and therefore was brought to the ER. From there the patient was transferred to [facility] for further care given his recent surgical procedure was here.  Assessment and Plan: AKI on CKD 3B. It appears that the patient's serum creatinine recently was around 2 at the time of the discharge. On presentation serum creatinine was 2.3 suggesting possible AKI. Treated with IV fluid.  Will monitor response.  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 Seen on the CT scan. Currently on IV Zosyn . General surgery consulted.  Incisional/parastomal, left inguinal incarcerated hernia s/p robotic hernia repair  General Surgery consulted. Patient has multiple drains and wound VAC. Management per surgery.  Intractable nausea and vomiting. I do not have CT scan to review the images although report does not mention anything about any intra-abdominal pathology. For now we will monitor.  Type II DM. Metformin. In the setting of nausea may not be an adequate medication.  HLD. Continue Crestor .  Obesity Class  Body mass index is 31.49 kg/m.  Placing the pt at higher risk of poor outcomes.  Subjective: Denies any  nausea.  Patient is not directly from SNF.  Patient went to [facility] ER due to difficulty with ileostomy bag placement.  Reports ongoing nausea and occasional vomiting prior to hospitalization.  Denies any chest pain.  Pain in the abdomen is well-controlled.  Physical Exam: General: in Mild distress, No Rash Cardiovascular: S1 and S2 Present, No Murmur Respiratory: Good respiratory effort, Bilateral Air entry present. No Crackles, No wheezes Abdomen: Bowel Sound present, No tenderness Extremities: No edema Neuro: Alert and oriented x3, no new focal deficit  Data Reviewed: I have Reviewed nursing notes, Vitals, and Lab results. Since last encounter, pertinent lab results CBC and BMP   . I have ordered test including CBC and BMP  . I have discussed pt's care plan and test results with general surgery  .   Disposition: Status is: Inpatient Remains inpatient appropriate because: Monitor for improvement in renal function apixaban  (ELIQUIS ) tablet 5 mg   Family Communication: No one at bedside Level of care: Progressive   Vitals:   05/26/24 0513 05/26/24 0737 05/26/24 1232 05/26/24 1635  BP: 98/72 90/62 (!) 89/57 90/60  Pulse: 96 93 97 99  Resp:  18 18 18   Temp: 97.8 F (36.6 C) (!) 97.5 F (36.4 C)  97.9 F (36.6 C)  TempSrc:  Oral  Oral  SpO2: 100% 99% 98% 100%  Weight:      Height:         Author: Yetta Blanch, MD 05/26/2024 6:09 PM  Please look on [email or web address] to find out who is on call.

## 2024-05-26 NOTE — Progress Notes (Signed)
 Inpatient Rehab Admissions Coordinator:   Per toc request patient was screened for CIR candidacy by [name] Kleine, MS, CCC-SLP . At this time, Pt. Appears to be a a potential candidate for CIR. I will place   order for rehab consult per protocol for full assessment. Please contact me any with questions.  [name] Kleine, MS, CCC-SLP Rehab Admissions Coordinator  [phone] (celll) [phone] (office)

## 2024-05-26 NOTE — Progress Notes (Signed)
 Transition of Care [facility]) - Inpatient Brief Assessment   Patient Details  Name: Kristopher Thompson MRN: [id number] Date of Birth: 1951/10/08  Transition of Care [facility]) CM/SW Contact:    Rosaline JONELLE Joe, RN Phone Number: 05/26/2024, 9:00 AM   Clinical Narrative: Patient transferred to [facility] hospital from [facility] for evaluation of high output from ileostomy.  IP Care Management Team will continue to follow the patient for discharge needs as patient progresses.   Transition of Care Asessment: Insurance and Status: (P) Insurance coverage has been reviewed Patient has primary care physician: (P) Yes Home environment has been reviewed: (P) from [facility]     Social Drivers of Health Review: (P) SDOH reviewed needs interventions Readmission risk has been reviewed: (P) Yes Transition of care needs: (P) transition of care needs identified, TOC will continue to follow

## 2024-05-27 ENCOUNTER — Other Ambulatory Visit: Payer: Self-pay

## 2024-05-27 ENCOUNTER — Encounter (HOSPITAL_COMMUNITY): Payer: Self-pay | Admitting: Physical Medicine and Rehabilitation

## 2024-05-27 ENCOUNTER — Inpatient Hospital Stay (HOSPITAL_COMMUNITY)
Admission: AD | Admit: 2024-05-27 | Discharge: 2024-06-19 | DRG: 945 | Disposition: A | Discharge status: Home Health | Source: Intra-hospital | Attending: Physical Medicine and Rehabilitation | Admitting: Physical Medicine and Rehabilitation

## 2024-05-27 DIAGNOSIS — D62 Acute posthemorrhagic anemia: Secondary | ICD-10-CM | POA: Diagnosis present

## 2024-05-27 DIAGNOSIS — Z800 Family history of malignant neoplasm of digestive organs: Secondary | ICD-10-CM

## 2024-05-27 DIAGNOSIS — I951 Orthostatic hypotension: Secondary | ICD-10-CM | POA: Diagnosis present

## 2024-05-27 DIAGNOSIS — Z906 Acquired absence of other parts of urinary tract: Secondary | ICD-10-CM | POA: Diagnosis not present

## 2024-05-27 DIAGNOSIS — E785 Hyperlipidemia, unspecified: Secondary | ICD-10-CM | POA: Diagnosis present

## 2024-05-27 DIAGNOSIS — K219 Gastro-esophageal reflux disease without esophagitis: Secondary | ICD-10-CM | POA: Diagnosis present

## 2024-05-27 DIAGNOSIS — N1832 Chronic kidney disease, stage 3b: Secondary | ICD-10-CM | POA: Diagnosis present

## 2024-05-27 DIAGNOSIS — Z7984 Long term (current) use of oral hypoglycemic drugs: Secondary | ICD-10-CM

## 2024-05-27 DIAGNOSIS — D631 Anemia in chronic kidney disease: Secondary | ICD-10-CM | POA: Diagnosis present

## 2024-05-27 DIAGNOSIS — I130 Hypertensive heart and chronic kidney disease with heart failure and stage 1 through stage 4 chronic kidney disease, or unspecified chronic kidney disease: Secondary | ICD-10-CM | POA: Diagnosis present

## 2024-05-27 DIAGNOSIS — Z888 Allergy status to other drugs, medicaments and biological substances status: Secondary | ICD-10-CM

## 2024-05-27 DIAGNOSIS — E871 Hypo-osmolality and hyponatremia: Secondary | ICD-10-CM | POA: Diagnosis present

## 2024-05-27 DIAGNOSIS — F4321 Adjustment disorder with depressed mood: Secondary | ICD-10-CM | POA: Diagnosis present

## 2024-05-27 DIAGNOSIS — Y838 Other surgical procedures as the cause of abnormal reaction of the patient, or of later complication, without mention of misadventure at the time of the procedure: Secondary | ICD-10-CM | POA: Diagnosis present

## 2024-05-27 DIAGNOSIS — N136 Pyonephrosis: Secondary | ICD-10-CM | POA: Diagnosis not present

## 2024-05-27 DIAGNOSIS — R5381 Other malaise: Secondary | ICD-10-CM | POA: Diagnosis present

## 2024-05-27 DIAGNOSIS — Z794 Long term (current) use of insulin: Secondary | ICD-10-CM | POA: Diagnosis not present

## 2024-05-27 DIAGNOSIS — Z8546 Personal history of malignant neoplasm of prostate: Secondary | ICD-10-CM

## 2024-05-27 DIAGNOSIS — E46 Unspecified protein-calorie malnutrition: Secondary | ICD-10-CM | POA: Diagnosis present

## 2024-05-27 DIAGNOSIS — Z6831 Body mass index (BMI) 31.0-31.9, adult: Secondary | ICD-10-CM | POA: Diagnosis not present

## 2024-05-27 DIAGNOSIS — Z932 Ileostomy status: Secondary | ICD-10-CM

## 2024-05-27 DIAGNOSIS — Z7901 Long term (current) use of anticoagulants: Secondary | ICD-10-CM

## 2024-05-27 DIAGNOSIS — D638 Anemia in other chronic diseases classified elsewhere: Secondary | ICD-10-CM | POA: Diagnosis not present

## 2024-05-27 DIAGNOSIS — N189 Chronic kidney disease, unspecified: Secondary | ICD-10-CM | POA: Diagnosis not present

## 2024-05-27 DIAGNOSIS — E119 Type 2 diabetes mellitus without complications: Secondary | ICD-10-CM | POA: Diagnosis not present

## 2024-05-27 DIAGNOSIS — Z801 Family history of malignant neoplasm of trachea, bronchus and lung: Secondary | ICD-10-CM

## 2024-05-27 DIAGNOSIS — T83038A Leakage of other indwelling urethral catheter, initial encounter: Secondary | ICD-10-CM | POA: Diagnosis not present

## 2024-05-27 DIAGNOSIS — N390 Urinary tract infection, site not specified: Secondary | ICD-10-CM | POA: Diagnosis not present

## 2024-05-27 DIAGNOSIS — F419 Anxiety disorder, unspecified: Secondary | ICD-10-CM | POA: Diagnosis present

## 2024-05-27 DIAGNOSIS — L7634 Postprocedural seroma of skin and subcutaneous tissue following other procedure: Secondary | ICD-10-CM | POA: Diagnosis present

## 2024-05-27 DIAGNOSIS — L24B3 Irritant contact dermatitis related to fecal or urinary stoma or fistula: Secondary | ICD-10-CM | POA: Diagnosis present

## 2024-05-27 DIAGNOSIS — R198 Other specified symptoms and signs involving the digestive system and abdomen: Secondary | ICD-10-CM

## 2024-05-27 DIAGNOSIS — K631 Perforation of intestine (nontraumatic): Secondary | ICD-10-CM | POA: Diagnosis present

## 2024-05-27 DIAGNOSIS — E860 Dehydration: Secondary | ICD-10-CM | POA: Diagnosis present

## 2024-05-27 DIAGNOSIS — Z79899 Other long term (current) drug therapy: Secondary | ICD-10-CM

## 2024-05-27 DIAGNOSIS — Z936 Other artificial openings of urinary tract status: Secondary | ICD-10-CM

## 2024-05-27 DIAGNOSIS — N2882 Megaloureter: Secondary | ICD-10-CM | POA: Diagnosis present

## 2024-05-27 DIAGNOSIS — Z87891 Personal history of nicotine dependence: Secondary | ICD-10-CM

## 2024-05-27 DIAGNOSIS — N178 Other acute kidney failure: Secondary | ICD-10-CM | POA: Diagnosis not present

## 2024-05-27 DIAGNOSIS — B961 Klebsiella pneumoniae [K. pneumoniae] as the cause of diseases classified elsewhere: Secondary | ICD-10-CM | POA: Diagnosis not present

## 2024-05-27 DIAGNOSIS — G7281 Critical illness myopathy: Secondary | ICD-10-CM | POA: Diagnosis present

## 2024-05-27 DIAGNOSIS — Z8551 Personal history of malignant neoplasm of bladder: Secondary | ICD-10-CM

## 2024-05-27 DIAGNOSIS — Z9049 Acquired absence of other specified parts of digestive tract: Secondary | ICD-10-CM

## 2024-05-27 DIAGNOSIS — I2510 Atherosclerotic heart disease of native coronary artery without angina pectoris: Secondary | ICD-10-CM | POA: Diagnosis present

## 2024-05-27 DIAGNOSIS — E669 Obesity, unspecified: Secondary | ICD-10-CM | POA: Diagnosis present

## 2024-05-27 DIAGNOSIS — G4733 Obstructive sleep apnea (adult) (pediatric): Secondary | ICD-10-CM | POA: Diagnosis present

## 2024-05-27 DIAGNOSIS — I5032 Chronic diastolic (congestive) heart failure: Secondary | ICD-10-CM | POA: Diagnosis present

## 2024-05-27 DIAGNOSIS — C679 Malignant neoplasm of bladder, unspecified: Secondary | ICD-10-CM | POA: Diagnosis present

## 2024-05-27 DIAGNOSIS — K659 Peritonitis, unspecified: Secondary | ICD-10-CM | POA: Diagnosis not present

## 2024-05-27 DIAGNOSIS — R630 Anorexia: Secondary | ICD-10-CM | POA: Diagnosis present

## 2024-05-27 DIAGNOSIS — Z85828 Personal history of other malignant neoplasm of skin: Secondary | ICD-10-CM

## 2024-05-27 DIAGNOSIS — N9989 Other postprocedural complications and disorders of genitourinary system: Secondary | ICD-10-CM | POA: Diagnosis present

## 2024-05-27 DIAGNOSIS — Z8249 Family history of ischemic heart disease and other diseases of the circulatory system: Secondary | ICD-10-CM

## 2024-05-27 DIAGNOSIS — E1122 Type 2 diabetes mellitus with diabetic chronic kidney disease: Secondary | ICD-10-CM | POA: Diagnosis present

## 2024-05-27 DIAGNOSIS — R9431 Abnormal electrocardiogram [ECG] [EKG]: Secondary | ICD-10-CM | POA: Diagnosis not present

## 2024-05-27 DIAGNOSIS — R112 Nausea with vomiting, unspecified: Secondary | ICD-10-CM | POA: Diagnosis present

## 2024-05-27 DIAGNOSIS — N179 Acute kidney failure, unspecified: Secondary | ICD-10-CM | POA: Diagnosis present

## 2024-05-27 DIAGNOSIS — E876 Hypokalemia: Secondary | ICD-10-CM | POA: Diagnosis not present

## 2024-05-27 DIAGNOSIS — R000 Tachycardia, unspecified: Secondary | ICD-10-CM | POA: Diagnosis not present

## 2024-05-27 DIAGNOSIS — Z9889 Other specified postprocedural states: Secondary | ICD-10-CM | POA: Diagnosis present

## 2024-05-27 DIAGNOSIS — E861 Hypovolemia: Secondary | ICD-10-CM | POA: Diagnosis present

## 2024-05-27 DIAGNOSIS — G4700 Insomnia, unspecified: Secondary | ICD-10-CM | POA: Diagnosis present

## 2024-05-27 DIAGNOSIS — Z86711 Personal history of pulmonary embolism: Secondary | ICD-10-CM

## 2024-05-27 DIAGNOSIS — Z9079 Acquired absence of other genital organ(s): Secondary | ICD-10-CM

## 2024-05-27 LAB — CBC WITH DIFFERENTIAL/PLATELET
Abs Immature Granulocytes: 0.09 K/uL — ABNORMAL HIGH (ref 0.00–0.07)
Basophils Absolute: 0.1 K/uL (ref 0.0–0.1)
Basophils Relative: 1 %
Eosinophils Absolute: 0.6 K/uL — ABNORMAL HIGH (ref 0.0–0.5)
Eosinophils Relative: 7 %
HCT: 37.2 % — ABNORMAL LOW (ref 39.0–52.0)
Hemoglobin: 12.7 g/dL — ABNORMAL LOW (ref 13.0–17.0)
Immature Granulocytes: 1 %
Lymphocytes Relative: 14 %
Lymphs Abs: 1.2 K/uL (ref 0.7–4.0)
MCH: 29.8 pg (ref 26.0–34.0)
MCHC: 34.1 g/dL (ref 30.0–36.0)
MCV: 87.3 fL (ref 80.0–100.0)
Monocytes Absolute: 1.4 K/uL — ABNORMAL HIGH (ref 0.1–1.0)
Monocytes Relative: 17 %
Neutro Abs: 5 K/uL (ref 1.7–7.7)
Neutrophils Relative %: 60 %
Platelets: 246 K/uL (ref 150–400)
RBC: 4.26 MIL/uL (ref 4.22–5.81)
RDW: 15.3 % (ref 11.5–15.5)
WBC: 8.4 K/uL (ref 4.0–10.5)
nRBC: 0 % (ref 0.0–0.2)

## 2024-05-27 LAB — COMPREHENSIVE METABOLIC PANEL WITH GFR
ALT: 53 U/L — ABNORMAL HIGH (ref 0–44)
AST: 25 U/L (ref 15–41)
Albumin: 2.9 g/dL — ABNORMAL LOW (ref 3.5–5.0)
Alkaline Phosphatase: 76 U/L (ref 38–126)
Anion gap: 13 (ref 5–15)
BUN: 82 mg/dL — ABNORMAL HIGH (ref 8–23)
CO2: 16 mmol/L — ABNORMAL LOW (ref 22–32)
Calcium: 9.5 mg/dL (ref 8.9–10.3)
Chloride: 101 mmol/L (ref 98–111)
Creatinine, Ser: 1.91 mg/dL — ABNORMAL HIGH (ref 0.61–1.24)
GFR, Estimated: 37 mL/min — ABNORMAL LOW (ref >60)
Glucose, Bld: 98 mg/dL (ref 70–99)
Potassium: 4.1 mmol/L (ref 3.5–5.1)
Sodium: 130 mmol/L — ABNORMAL LOW (ref 135–145)
Total Bilirubin: 0.8 mg/dL (ref 0.0–1.2)
Total Protein: 6.3 g/dL — ABNORMAL LOW (ref 6.5–8.1)

## 2024-05-27 LAB — GLUCOSE, CAPILLARY
Glucose-Capillary: 106 mg/dL — ABNORMAL HIGH (ref 70–99)
Glucose-Capillary: 139 mg/dL — ABNORMAL HIGH (ref 70–99)
Glucose-Capillary: 122 mg/dL — ABNORMAL HIGH (ref 70–99)
Glucose-Capillary: 106 mg/dL — ABNORMAL HIGH (ref 70–99)

## 2024-05-27 LAB — PHOSPHORUS: Phosphorus: 3.8 mg/dL (ref 2.5–4.6)

## 2024-05-27 LAB — PREALBUMIN: Prealbumin: 29 mg/dL (ref 18–38)

## 2024-05-27 LAB — MAGNESIUM: Magnesium: 2.2 mg/dL (ref 1.7–2.4)

## 2024-05-27 MED ORDER — MIDODRINE HCL 5 MG PO TABS: 5.0000 mg | ORAL_TABLET | Freq: Three times a day (TID) | ORAL | 0 refills | Status: DC

## 2024-05-27 MED ORDER — DIPHENHYDRAMINE HCL 25 MG PO CAPS
25.0000 mg | ORAL_CAPSULE | Freq: Four times a day (QID) | ORAL | Status: DC | PRN
  Filled 2024-05-27: qty 1

## 2024-05-27 MED ORDER — APIXABAN 5 MG PO TABS
5.0000 mg | ORAL_TABLET | Freq: Two times a day (BID) | ORAL | Status: DC
  Administered 2024-05-27 – 2024-06-19 (×46): 5 mg via ORAL
  Filled 2024-05-27 (×46): qty 1

## 2024-05-27 MED ORDER — ALBUTEROL SULFATE (2.5 MG/3ML) 0.083% IN NEBU: 2.5000 mg | INHALATION_SOLUTION | RESPIRATORY_TRACT | Status: DC | PRN

## 2024-05-27 MED ORDER — PROCHLORPERAZINE EDISYLATE 10 MG/2ML IJ SOLN
5.0000 mg | Freq: Four times a day (QID) | INTRAMUSCULAR | Status: DC | PRN
  Administered 2024-05-28: 10 mg via INTRAVENOUS
  Administered 2024-05-30: 5 mg via INTRAVENOUS
  Administered 2024-05-31 – 2024-06-01 (×2): 10 mg via INTRAVENOUS
  Administered 2024-06-09: 5 mg via INTRAVENOUS
  Filled 2024-05-27 (×6): qty 2

## 2024-05-27 MED ORDER — JUVEN PO PACK
1.0000 | PACK | Freq: Two times a day (BID) | ORAL | Status: DC
  Administered 2024-05-28 – 2024-06-18 (×29): 1 via ORAL
  Filled 2024-05-27 (×25): qty 1

## 2024-05-27 MED ORDER — OXYCODONE-ACETAMINOPHEN 5-325 MG PO TABS
1.0000 | ORAL_TABLET | Freq: Four times a day (QID) | ORAL | Status: DC | PRN
  Administered 2024-05-28 – 2024-05-29 (×4): 1 via ORAL
  Filled 2024-05-27 (×5): qty 1

## 2024-05-27 MED ORDER — METOCLOPRAMIDE HCL 5 MG PO TABS: 5.0000 mg | ORAL_TABLET | Freq: Three times a day (TID) | ORAL | Status: DC | PRN

## 2024-05-27 MED ORDER — MIDODRINE HCL 5 MG PO TABS: 5.0000 mg | ORAL_TABLET | Freq: Three times a day (TID) | ORAL | Status: DC

## 2024-05-27 MED ORDER — SODIUM CHLORIDE 0.9 % IV SOLN: INTRAVENOUS | Status: DC

## 2024-05-27 MED ORDER — ROSUVASTATIN CALCIUM 5 MG PO TABS
10.0000 mg | ORAL_TABLET | Freq: Every evening | ORAL | Status: DC
  Administered 2024-05-27 – 2024-06-18 (×23): 10 mg via ORAL
  Filled 2024-05-27 (×23): qty 2

## 2024-05-27 MED ORDER — ACETAMINOPHEN 500 MG PO TABS
500.0000 mg | ORAL_TABLET | Freq: Three times a day (TID) | ORAL | Status: DC
  Administered 2024-05-27 – 2024-06-02 (×15): 500 mg via ORAL
  Filled 2024-05-27 (×17): qty 1

## 2024-05-27 MED ORDER — CALCIUM POLYCARBOPHIL 625 MG PO TABS
625.0000 mg | ORAL_TABLET | Freq: Two times a day (BID) | ORAL | Status: DC
  Administered 2024-05-27 – 2024-06-01 (×7): 625 mg via ORAL
  Filled 2024-05-27 (×10): qty 1

## 2024-05-27 MED ORDER — PROCHLORPERAZINE MALEATE 5 MG PO TABS
5.0000 mg | ORAL_TABLET | Freq: Four times a day (QID) | ORAL | Status: DC | PRN
  Administered 2024-05-27 – 2024-06-02 (×2): 10 mg via ORAL
  Filled 2024-05-27 (×3): qty 2

## 2024-05-27 MED ORDER — LOPERAMIDE HCL 2 MG PO CAPS
4.0000 mg | ORAL_CAPSULE | Freq: Two times a day (BID) | ORAL | Status: DC
  Administered 2024-05-27 – 2024-05-31 (×9): 4 mg via ORAL
  Filled 2024-05-27 (×9): qty 2

## 2024-05-27 MED ORDER — PHENOL 1.4 % MT LIQD: 2.0000 | OROMUCOSAL | Status: DC | PRN

## 2024-05-27 MED ORDER — MIDODRINE HCL 5 MG PO TABS
10.0000 mg | ORAL_TABLET | Freq: Three times a day (TID) | ORAL | Status: DC
  Administered 2024-05-27 – 2024-06-11 (×45): 10 mg via ORAL
  Filled 2024-05-27 (×45): qty 2

## 2024-05-27 MED ORDER — MAGIC MOUTHWASH: 15.0000 mL | Freq: Four times a day (QID) | ORAL | Status: DC | PRN

## 2024-05-27 MED ORDER — INSULIN ASPART 100 UNIT/ML IJ SOLN: 0.0000 [IU] | Freq: Three times a day (TID) | INTRAMUSCULAR | Status: DC

## 2024-05-27 MED ORDER — METOCLOPRAMIDE HCL 5 MG PO TABS
5.0000 mg | ORAL_TABLET | Freq: Three times a day (TID) | ORAL | Status: DC | PRN
  Administered 2024-05-30 (×2): 10 mg via ORAL
  Filled 2024-05-27 (×2): qty 2

## 2024-05-27 MED ORDER — FLEET ENEMA RE ENEM
1.0000 | ENEMA | Freq: Once | RECTAL | Status: DC | PRN
Start: 2024-05-27 — End: 2024-06-19

## 2024-05-27 MED ORDER — L-METHYLFOLATE-B6-B12 3-35-2 MG PO TABS
1.0000 | ORAL_TABLET | Freq: Every day | ORAL | Status: DC
  Administered 2024-05-27: 1 via ORAL
  Filled 2024-05-27: qty 1

## 2024-05-27 MED ORDER — ONDANSETRON HCL 4 MG/2ML IJ SOLN
4.0000 mg | Freq: Four times a day (QID) | INTRAMUSCULAR | Status: DC | PRN
  Administered 2024-05-27: 4 mg via INTRAVENOUS
  Filled 2024-05-27: qty 2

## 2024-05-27 MED ORDER — PIPERACILLIN-TAZOBACTAM 3.375 G IVPB
3.3750 g | Freq: Three times a day (TID) | INTRAVENOUS | Status: DC
  Filled 2024-05-27 (×2): qty 50

## 2024-05-27 MED ORDER — PROCHLORPERAZINE 25 MG RE SUPP: 12.5000 mg | Freq: Four times a day (QID) | RECTAL | Status: DC | PRN

## 2024-05-27 MED ORDER — ALUM & MAG HYDROXIDE-SIMETH 200-200-20 MG/5ML PO SUSP: 30.0000 mL | ORAL | Status: DC | PRN

## 2024-05-27 MED ORDER — SALINE SPRAY 0.65 % NA SOLN: 1.0000 | Freq: Four times a day (QID) | NASAL | Status: DC | PRN

## 2024-05-27 MED ORDER — NAPHAZOLINE-GLYCERIN 0.012-0.25 % OP SOLN: 1.0000 [drp] | Freq: Four times a day (QID) | OPHTHALMIC | Status: DC | PRN

## 2024-05-27 MED ORDER — OXYCODONE-ACETAMINOPHEN 5-325 MG PO TABS: 1.0000 | ORAL_TABLET | Freq: Four times a day (QID) | ORAL | Status: DC | PRN

## 2024-05-27 MED ORDER — INSULIN ASPART 100 UNIT/ML IJ SOLN: 0.0000 [IU] | Freq: Every day | INTRAMUSCULAR | Status: DC

## 2024-05-27 MED ORDER — HYDROXYZINE HCL 25 MG PO TABS: 25.0000 mg | ORAL_TABLET | Freq: Three times a day (TID) | ORAL | Status: DC | PRN

## 2024-05-27 MED ORDER — ACETAMINOPHEN 500 MG PO TABS
500.0000 mg | ORAL_TABLET | Freq: Three times a day (TID) | ORAL | Status: DC
  Administered 2024-05-27: 500 mg via ORAL
  Filled 2024-05-27: qty 1

## 2024-05-27 MED ORDER — PROSOURCE PLUS PO LIQD
30.0000 mL | Freq: Two times a day (BID) | ORAL | Status: DC
  Administered 2024-06-01 – 2024-06-12 (×4): 30 mL via ORAL
  Filled 2024-05-27 (×10): qty 30

## 2024-05-27 MED ORDER — TRAZODONE HCL 50 MG PO TABS
25.0000 mg | ORAL_TABLET | Freq: Every evening | ORAL | Status: DC | PRN
  Administered 2024-06-01 – 2024-06-05 (×4): 50 mg via ORAL
  Filled 2024-05-27 (×5): qty 1

## 2024-05-27 MED ORDER — ACETAMINOPHEN 325 MG PO TABS
325.0000 mg | ORAL_TABLET | ORAL | Status: DC | PRN
  Administered 2024-05-28: 325 mg via ORAL
  Administered 2024-06-07: 650 mg via ORAL
  Filled 2024-05-27 (×3): qty 2

## 2024-05-27 MED ORDER — MENTHOL 3 MG MT LOZG: 1.0000 | LOZENGE | OROMUCOSAL | Status: DC | PRN

## 2024-05-27 MED ORDER — BISACODYL 10 MG RE SUPP: 10.0000 mg | Freq: Every day | RECTAL | Status: DC | PRN

## 2024-05-27 MED ORDER — GUAIFENESIN-DM 100-10 MG/5ML PO SYRP: 5.0000 mL | ORAL_SOLUTION | Freq: Four times a day (QID) | ORAL | Status: DC | PRN

## 2024-05-27 MED ORDER — LOPERAMIDE HCL 2 MG PO CAPS: 2.0000 mg | ORAL_CAPSULE | Freq: Four times a day (QID) | ORAL | Status: DC | PRN

## 2024-05-27 MED ORDER — LOPERAMIDE HCL 2 MG PO CAPS
4.0000 mg | ORAL_CAPSULE | Freq: Two times a day (BID) | ORAL | 0 refills | Status: DC
Start: 2024-05-27 — End: 2024-08-13

## 2024-05-27 MED ORDER — ENSURE PLUS HIGH PROTEIN PO LIQD
237.0000 mL | Freq: Two times a day (BID) | ORAL | Status: DC
  Administered 2024-05-31 – 2024-06-18 (×11): 237 mL via ORAL

## 2024-05-27 NOTE — Evaluation (Signed)
 Occupational Therapy Evaluation Patient Details Name: Kristopher Thompson MRN: [id number] DOB: 07-09-1951 Today's Date: 05/27/2024   History of Present Illness   73 y.o. male presents to [facility]/25/[facility] from SNF due to increased ostomy output of stoma and need for higher level of care. Extensive prior stay 5/30-7/1 for treatment of parastomal and incisional incarcerated hernia. PMH: HTN, chronic systolic congestive heart failure, CAD, DM 2, pulmonary embolism on Eliquis , anemia, obesity, prostate cancer, bladder cancer, left inguinal incarcerated hernia s/p robotic repair, urostomy x 2, ileal conduit revision.     Clinical Impressions Prior to complicated hospitalization beginning in May, pt was functioning independently and living with his supportive wife. He reports having mobilized very little since d/c to SNF. Presents with generalized weakness, pain, decreased activity tolerance and poor standing balance. He requires mod assist to EOB and min assist to stand and step to chair with second person for safety. Pt needs set up to total assist for ADLs. Patient will benefit from intensive inpatient follow-up therapy, >3 hours/day.     If plan is discharge home, recommend the following:   Two people to help with walking and/or transfers;A lot of help with bathing/dressing/bathroom;Assistance with cooking/housework;Assist for transportation;Help with stairs or ramp for entrance     Functional Status Assessment   Patient has had a recent decline in their functional status and demonstrates the ability to make significant improvements in function in a reasonable and predictable amount of time.     Equipment Recommendations   None recommended by OT     Recommendations for Other Services         Precautions/Restrictions   Precautions Precautions: Fall Precaution/Restrictions Comments: urostomy, abd wound vac, bil perc nephrostomy drains Restrictions Weight Bearing Restrictions Per  Provider Order: No     Mobility Bed Mobility Overal bed mobility: Needs Assistance Bed Mobility: Supine to Sit     Supine to sit: Mod assist, HOB elevated, Used rails     General bed mobility comments: increased time, assist to for hips to EOB and to raise trunk    Transfers Overall transfer level: Needs assistance Equipment used: Rolling walker (2 wheels) Transfers: Sit to/from Stand, Bed to chair/wheelchair/BSC Sit to Stand: Min assist, +2 safety/equipment     Step pivot transfers: Min assist, +2 safety/equipment     General transfer comment: assist to rise and steady, pt immediately requesting to step to chair, appeared fatigued after transfer      Balance Overall balance assessment: Needs assistance Sitting-balance support: Feet supported Sitting balance-Leahy Scale: Fair     Standing balance support: Reliant on assistive device for balance, Bilateral upper extremity supported Standing balance-Leahy Scale: Poor Standing balance comment: reliant on RW and up to minA for balance                           ADL either performed or assessed with clinical judgement   ADL Overall ADL's : Needs assistance/impaired Eating/Feeding: Set up;Sitting   Grooming: Set up;Sitting   Upper Body Bathing: Minimal assistance;Sitting   Lower Body Bathing: Total assistance;Sit to/from stand;+2 for safety/equipment   Upper Body Dressing : Minimal assistance;Sitting   Lower Body Dressing: Total assistance;Sit to/from stand;+2 for safety/equipment   Toilet Transfer: Minimal assistance;+2 for safety/equipment;Rolling walker (2 wheels)   Toileting- Clothing Manipulation and Hygiene: Total assistance;+2 for safety/equipment;Sit to/from stand               Vision Baseline Vision/History: 1 Wears glasses Ability  to See in Adequate Light: 0 Adequate Patient Visual Report: No change from baseline       Perception         Praxis         Pertinent Vitals/Pain  Pain Assessment Pain Assessment: Faces Faces Pain Scale: Hurts little more Pain Location: abdomen, generalized Pain Descriptors / Indicators: Discomfort, Grimacing, Guarding Pain Intervention(s): Monitored during session, Repositioned     Extremity/Trunk Assessment Upper Extremity Assessment Upper Extremity Assessment: Generalized weakness   Lower Extremity Assessment Lower Extremity Assessment: Defer to PT evaluation   Cervical / Trunk Assessment Cervical / Trunk Assessment: Kyphotic;Other exceptions (weakness)   Communication Communication Communication: Impaired Factors Affecting Communication: Hearing impaired   Cognition Arousal: Alert Behavior During Therapy: Flat affect, Anxious Cognition: No apparent impairments                               Following commands: Intact Following commands impaired: Follows one step commands with increased time     Cueing  General Comments   Cueing Techniques: Verbal cues      Exercises     Shoulder Instructions      Home Living Family/patient expects to be discharged to:: Private residence Living Arrangements: Spouse/significant other Available Help at Discharge: Family;Available 24 hours/day Type of Home: House Home Access: Stairs to enter [employer] of Steps: 1   Home Layout: One level     Bathroom Shower/Tub: Chief Strategy Officer: Standard     Home Equipment: Rollator (4 wheels)          Prior Functioning/Environment Prior Level of Function : Independent/Modified Independent (prior to admission in May 2025)             Mobility Comments: pt has mobilized minimally since discharge to SNF ADLs Comments: assisted for ADLs since recent admission    OT Problem List: Decreased strength;Decreased activity tolerance;Impaired balance (sitting and/or standing);Decreased safety awareness;Pain   OT Treatment/Interventions: Self-care/ADL training;Therapeutic exercise;Energy  conservation;DME and/or AE instruction;Therapeutic activities;Patient/family education;Balance training      OT Goals(Current goals can be found in the care plan section)   Acute Rehab OT Goals OT Goal Formulation: With patient Time For Goal Achievement: 06/10/24 Potential to Achieve Goals: Good ADL Goals Pt Will Perform Grooming: with contact guard assist;standing Pt Will Perform Lower Body Bathing: with mod assist;sit to/from stand Pt Will Perform Lower Body Dressing: with mod assist;sit to/from stand Pt/caregiver will Perform Home Exercise Program: Increased strength;Both right and left upper extremity;With theraband;With written HEP provided;Independently Additional ADL Goal #1: Pt will complete bed mobility with supervision in preparation for ADLs.   OT Frequency:  Min 2X/week    Co-evaluation PT/OT/SLP Co-Evaluation/Treatment: Yes Reason for Co-Treatment: For patient/therapist safety   OT goals addressed during session: ADL's and self-care      AM-PAC OT 6 Clicks Daily Activity     Outcome Measure Help from another person eating meals?: None Help from another person taking care of personal grooming?: A Little Help from another person toileting, which includes using toliet, bedpan, or urinal?: Total Help from another person bathing (including washing, rinsing, drying)?: A Lot Help from another person to put on and taking off regular upper body clothing?: A Little Help from another person to put on and taking off regular lower body clothing?: Total 6 Click Score: 14   End of Session Equipment Utilized During Treatment: Rolling walker (2 wheels)  Activity Tolerance: Patient limited by  fatigue Patient left: in chair;with call bell/phone within reach;with family/visitor present;with chair alarm set  OT Visit Diagnosis: Unsteadiness on feet (R26.81);Other abnormalities of gait and mobility (R26.89);Muscle weakness (generalized) (M62.81);Pain                Time:  9165-9142 OT Time Calculation (min): 23 min Charges:  OT General Charges $OT Visit: 1 Visit OT Evaluation $OT Eval Moderate Complexity: 1 Mod  Mliss HERO, OTR/L Acute Rehabilitation Services Office: [phone]  Kennth Mliss Helling 05/27/2024, 9:52 AM

## 2024-05-27 NOTE — Progress Notes (Signed)
 [facility] night cross cover note:  I ordered prn IV Zofran  for nausea.    Eva Pore, DO Hospitalist

## 2024-05-27 NOTE — Progress Notes (Addendum)
 05/27/2024  Kristopher Thompson Merchant [id number] [birth year]  CARE TEAM: PCP: [name], MD  Outpatient Care Team: Patient Care Team: [name], MD as PCP - General (Internal Medicine) [name], MD (Inactive) as Consulting Physician (Dermatology) [name], MD as Referring Physician (Pulmonary Disease) [name], MD as Consulting Physician (General Surgery) [name], MD as Consulting Physician (Urology)  Inpatient Treatment Team: Treatment Team:  [name], MD [name], MD [name] Lela, RN [name], RN [name], [name], LCSW [name], MD [name], MD [name], RN Cecilie [name] KATHEE RAEJEAN [name], RN Apickup-Ot, A, OT [name], OT [name], RPH-CPP   Problem List:   Principal Problem:   Irritant contact dermatitis associated with fecal stoma Active Problems:   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 08/08/2017   S/P ileal conduit (HCC)   Obesity (BMI 35.0-39.9 without comorbidity)   High output ileostomy (HCC)   CKD stage 3b, GFR 30-44 ml/min (HCC)   04/10/2024  POST-OPERATIVE DIAGNOSIS:  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   PROCEDURE:   -ROBOTIC LYSIS OF ADHESIONS X 4 HOURS -COMPONENT SEPARATION - TRANSVERSUS ABDOMINIS REALEASE (TAR) BILATERAL -ROBOTIC REPAIRS OF  INCISIONAL, PARASTOMAL, LEFT INGUINAL  INCARCERATED ABDOMINAL WALL HERNIAS WITH MESH -UROSTOMY ILEAL CONDUIT REVISION -INTRAOPERATIVE ASSESSMENT OF TISSUE VASCULAR PERFUSION USING ICG (indocyanine green ) -IMMUNOFLUORESCENCE -TRANSVERSUS ABDOMINIS PLANE (TAP) BLOCK - BILATERAL    SURGEON:  [name], MD   04/20/2024 POST-OPERATIVE DIAGNOSIS:  perforated bowel   PROCEDURE:  Drainage of right abdominal wall abscess as well as intra-abdominal abscess Explantation of abdominal mesh Small bowel resection (in discontinuity) Placement of ABThera wound VAC    SURGEON:  [name], MD  04/21/2024  POST-OPERATIVE DIAGNOSIS:  DELAYED BOWEL PERFORATION WITH OPEN ABDOMEN   PROCEDURE:   LYSIS OF ADHESIONS X ABDOMINAL WALL DEBRIDEMENT ILEAL RESECTION END ILEOSTOMY ABDOMINAL WALL PARASTOMAL & INCISIONAL HERNIA REPAIRS WITH PHASIX MESH FASCIA CLOSURE WITH WOUND VAC PLACEMENT IN SQ   SURGEON:  [name], MD    05/04/2024 Interventional Radiology Procedure Note   Procedure: Bilateral percutaneous nephrostomy tube placements   Findings: Please refer to procedural dictation for full description. 10 Fr bilateral to bag drainage.   Complications: None immediate   Estimated Blood Loss: < 5 ml   Recommendations: Keep to bag drainage.  IR will follow.  Ultimate management as per Urology.   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  Adjusted 6/30, 7/1, 7/3 with stent going from kidney out urostomy across the presumed mid ileal on the right side leak.  Urine draining into urostomy and right lower quadrant surgical Blake drain in the abdominal wall resting just outside the ileal conduit.  Nephrostomy tubes & stenting per [name] U[facility].    Abdominal wall Blake drain creatinine same as serum for the past week consistent with resolved leak.    Defer to Dr. Renda.  I believe he is planning follow-up in a few weeks for repeat studies and testing and consider capping/adjusting/removing drains over time.    I would keep abdominal wall drain around the ileal  conduit for last as a marker  to make sure that the urine leak remains resolved.    If not controlled with urinary diversion and stenting, he may require revision of his ileal conduit.  That would be very high risk.  Understand they wish to hold off but do not want to have nephrostomy tubes indefinitely either if possible.  Defer to Dr. Renda urology with interventional radiology   Serum BUN & creatinine slowly coming down.  Likelihood of repeat dialysis seems rather low.  Nephrology following more peripherally  Nutrition:  PO Diet as tolerated.    Patient's been strong with intermittent nausea.  Seem to respond better to metoclopramide  over ondansetron .  I added that PRN (as needed)   Bowel regimen for ileostomy.  FiberCon twice daily and loperamide  twice daily.  Thicker effluent with 4 mg over 2 mg twice daily.    Agree with nutrition consultation and supplements.  Check prealbumin.  Was in the normal range at 38.   Infection due to delayed bowel perforation and abdominal wall infection/necrosis  History of growing multiple organisms including Candida albicans, Enterobacter, and Enterococcus.    IV Zosyn /micafungin  x6 weeks, completed 7/7.    I see no evidence of infection.  I see no reason to do IV antibiotics.  Midline incision wound with good granulation.  I would recommend continued wound VAC to help it heal faster and minimize complications.  Suspect within a couple weeks can be transition just to dry dressing only but we will see.   Metabolic encephalopathy resolved.  Follow.  Insomnia improved  ABLA on top of anemia of chronic disease improved with transfusion and iron  supplementation    -monitor electrolytes & replace as needed.  Keep K>4, Mg>2, Phos>3.    -Diabetes.  Sliding scale insulin .    -VTE prophylaxis-  Full anticoagulation given history of pulmonary embolism.  Eliquis .  -Mobilize as tolerated.  I ordered physical and Occupational Therapy more.  He tends to refuse to get out of bed.   External encouraged him to get out of bed to rehab more.    I updated the patient's status to the the patient, wife, and nurses.  Recommendations were made.  Questions were answered.  They expressed understanding & appreciation.  -Disposition: Transition to different facility that can provide ostomy and wound care and therapy.  Agree with considering inpatient rehab here since too much for closer facility.  No acute surgical issues.  Stable from surgery standpoint as long as there is better ostomy care and physical therapy to help with rehab    I reviewed nursing notes, last 24 h vitals and pain scores, last 48 h intake and output, last 24 h labs and trends, and last 24 h imaging results.  I have reviewed this patient's available data, including medical history, events of note, test results, etc as part of my evaluation.   A significant portion of that time was spent in counseling. Care during the described time interval was provided by me.  This care required high  level of medical decision making.  05/27/2024    Subjective: (Chief complaint) Patient complaining some nausea yet took in 1400s of liquids  Wife in room  Notes soreness on abdominal wall but I do not know if he is asking for pains.  Not out of bed.    Objective:  Vital signs:  Vitals:   05/26/24 1635 05/26/24 2100 05/27/24 0033 05/27/24 0341  BP: 90/60 107/62 93/60 (!) 92/59  Pulse: 99 94  91 87  Resp: 18 20 19 20   Temp: 97.9 F (36.6 C) (!) 97.1 F (36.2 C) 98.4 F (36.9 C) 98.4 F (36.9 C)  TempSrc: Oral Oral    SpO2: 100% 100% 100% 100%  Weight:      Height:        Last BM Date : 05/26/24  Intake/Output   Yesterday:  07/15 0701 - 07/16 0700 In: 2509.6 [P.O.:1400; I.V.:996.6; IV Piggyback:113.1] Out: 2185 [Urine:1515; Drains:70; Stool:600] This shift:  No intake/output data recorded.  Bowel function:  Flatus: YES  BM:  YES  Drains:  RLQ surgical Blake drain to biliary gravity bag drain  (rests between abdominal wall and Phasix mesh): Some old fat deposits I built through.  Mainly serosanguineous.   R & L back nephrostomy tubes in place.  - light yellow-colored -scant on left  Physical Exam:  General: Resting in no acute distress.  Legs crossed looking relaxed.  Baseline of being stoic but interactive.  Actually more chatty today..   Eyes: Glasses PERRL, normal EOM.  Sclera clear.  No icterus Neuro: CN II-XII intact w/o focal sensory/motor deficits. Lymph: No head/neck/groin lymphadenopathy Psych:  No delerium/psychosis/paranoia.  No agitation HENT: Normocephalic, Mucus membranes moist.  No thrush.  Mildly hard of hearing Neck: Supple, No tracheal deviation.  No obvious thyromegaly Chest: No pain to chest wall compression.  Good respiratory excursion.   CV:  Pulses intact.  Regular rhythm.  No extremity edema MS:  No obvious deformity  Abdomen:  Obese Soft.  Nondistended.  Mild soreness around abdominal surgical Blake drain.  Some erythema/ecchymosis along right flank consistent with end ileostomy stomal dermatitis/leaking.  Not consistent with cellulitis  Wound vac (in SQ over closed fascia) in midline.    RUQ: (End ileostomy): Pink mucosa.  Brown oatmeal consistency effluent in bag RLQ:  (Urostomy ileal conduit): Pink mucosa without any obvious separation.  Moderate clear light orange drainage.  Stent out ostomy  Ext:   No deformity.  No edema.  No cyanosis Skin: No petechiae / purpurea.  No major sores.  Warm and dry    Results:   Cultures: Recent Results (from the past 720 hours)  Culture, blood (Routine X 2) w Reflex to ID Panel     Status: None (Preliminary result)   Collection Time: 05/26/24 12:28 AM   Specimen: BLOOD LEFT ARM  Result Value Ref Range Status   Specimen Description BLOOD LEFT ARM  Final   Special Requests   Final    BOTTLES DRAWN AEROBIC AND ANAEROBIC Blood Culture adequate volume   Culture   Final    NO GROWTH 1 DAY Performed at [facility], [address], [place], [place] [address]    Report Status PENDING  Incomplete  Culture, blood (Routine X 2) w Reflex to ID Panel     Status: None (Preliminary result)   Collection Time: 05/26/24 12:53 AM   Specimen: BLOOD RIGHT HAND  Result Value Ref Range Status   Specimen Description BLOOD RIGHT HAND  Final   Special Requests   Final    BOTTLES DRAWN AEROBIC AND ANAEROBIC Blood Culture adequate volume   Culture   Final    NO GROWTH 1 DAY Performed at [facility], [address] [place], [place] [address]    Report Status PENDING  Incomplete     Labs: Results for orders placed or performed during the hospital encounter of 05/25/24 (from the past 48 hours)  Brain natriuretic peptide  Status: None   Collection Time: 05/25/24 11:38 PM  Result Value Ref Range   B Natriuretic Peptide 24.6 0.0 - 100.0 pg/mL    Comment: Performed at [facility], [address], [place], [place] [address]  Culture, blood (Routine X 2) w Reflex to ID Panel     Status: None (Preliminary result)   Collection Time: 05/26/24 12:28 AM   Specimen: BLOOD LEFT ARM  Result Value Ref Range   Specimen Description BLOOD LEFT ARM    Special Requests      BOTTLES DRAWN AEROBIC AND ANAEROBIC Blood Culture adequate volume   Culture      NO GROWTH 1 DAY Performed at [facility], [address], [place], [place] [address]    Report Status PENDING   Comprehensive metabolic panel     Status: Abnormal   Collection Time: 05/26/24 12:53 AM  Result Value Ref Range   Sodium 125 (L) 135 - 145 mmol/L   Potassium 4.5 3.5 - 5.1 mmol/L   Chloride 98 98 - 111 mmol/L   CO2 14 (L) 22 - 32 mmol/L   Glucose, Bld 114 (H) 70 - 99 mg/dL    Comment: Glucose reference range applies only to samples taken after fasting for at least 8 hours.   BUN 93 (H) 8 - 23 mg/dL   Creatinine, Ser 7.75 (H) 0.61 - 1.24 mg/dL   Calcium  9.8 8.9 - 10.3 mg/dL   Total Protein 6.9 6.5 - 8.1 g/dL   Albumin  3.1 (L)  3.5 - 5.0 g/dL   AST 42 (H) 15 - 41 U/L   ALT 81 (H) 0 - 44 U/L   Alkaline Phosphatase 96 38 - 126 U/L   Total Bilirubin 0.4 0.0 - 1.2 mg/dL   GFR, Estimated 31 (L) >60 mL/min    Comment: (NOTE) Calculated using the CKD-EPI Creatinine Equation (2021)    Anion gap 13 5 - 15    Comment: Performed at [facility], [address] [place], [place] [address]  Magnesium      Status: None   Collection Time: 05/26/24 12:53 AM  Result Value Ref Range   Magnesium  2.4 1.7 - 2.4 mg/dL    Comment: Performed at [facility], [address], [place], [place] [address]  Phosphorus     Status: Abnormal   Collection Time: 05/26/24 12:53 AM  Result Value Ref Range   Phosphorus 5.2 (H) 2.5 - 4.6 mg/dL    Comment: Performed at [facility], [address] [place], [place] [address]  CBC with Differential/Platelet     Status: Abnormal   Collection Time: 05/26/24 12:53 AM  Result Value Ref Range   WBC 11.9 (H) 4.0 - 10.5 K/uL   RBC 5.00 4.22 - 5.81 MIL/uL   Hemoglobin 14.9 13.0 - 17.0 g/dL   HCT 54.8 60.9 - 47.9 %   MCV 90.2 80.0 - 100.0 fL   MCH 29.8 26.0 - 34.0 pg   MCHC 33.0 30.0 - 36.0 g/dL   RDW 84.6 88.4 - 84.4 %   Platelets 259 150 - 400 K/uL   nRBC 0.0 0.0 - 0.2 %   Neutrophils Relative % 62 %   Neutro Abs 7.5 1.7 - 7.7 K/uL   Lymphocytes Relative 17 %   Lymphs Abs 2.0 0.7 - 4.0 K/uL   Monocytes Relative 13 %   Monocytes Absolute 1.6 (H) 0.1 - 1.0 K/uL   Eosinophils Relative 4 %   Eosinophils Absolute  0.5 0.0 - 0.5 K/uL   Basophils Relative 2 %   Basophils Absolute 0.2 (H) 0.0 - 0.1 K/uL   Immature Granulocytes 2 %   Abs Immature Granulocytes 0.19 (H) 0.00 - 0.07 K/uL    Comment: Performed at [facility], [address] [place], [place] [address]  Protime-INR     Status: Abnormal   Collection Time: 05/26/24 12:53 AM  Result Value Ref Range   Prothrombin Time 16.3 (H) 11.4 - 15.2 seconds   INR 1.2 0.8 - 1.2    Comment: (NOTE) INR goal varies based on  device and disease states. Performed at [facility], [address] [place], [place] [address]   TSH     Status: None   Collection Time: 05/26/24 12:53 AM  Result Value Ref Range   TSH 1.457 0.350 - 4.500 uIU/mL    Comment: Performed by a 3rd Generation assay with a functional sensitivity of <=0.01 uIU/mL. Performed at [facility], [address], [place], [place] [address]   Culture, blood (Routine X 2) w Reflex to ID Panel     Status: None (Preliminary result)   Collection Time: 05/26/24 12:53 AM   Specimen: BLOOD RIGHT HAND  Result Value Ref Range   Specimen Description BLOOD RIGHT HAND    Special Requests      BOTTLES DRAWN AEROBIC AND ANAEROBIC Blood Culture adequate volume   Culture      NO GROWTH 1 DAY Performed at [facility], [address] [place], [place] [address]    Report Status PENDING   C-reactive protein     Status: Abnormal   Collection Time: 05/26/24 12:53 AM  Result Value Ref Range   CRP 1.1 (H) <1.0 mg/dL    Comment: Performed at [facility], [address] [place], [place] [address]  Procalcitonin     Status: None   Collection Time: 05/26/24 12:53 AM  Result Value Ref Range   Procalcitonin <0.10 ng/mL    Comment:        Interpretation: PCT (Procalcitonin) <= 0.5 ng/mL: Systemic infection (sepsis) is not likely. Local bacterial infection is possible. (NOTE)       Sepsis PCT Algorithm           Lower Respiratory Tract                                      Infection PCT Algorithm    ----------------------------     ----------------------------         PCT < 0.25 ng/mL                PCT < 0.10 ng/mL          Strongly encourage             Strongly discourage   discontinuation of antibiotics    initiation of antibiotics    ----------------------------     -----------------------------       PCT 0.25 - 0.50 ng/mL            PCT 0.10 - 0.25 ng/mL               OR       >80% decrease in PCT            Discourage initiation  of  antibiotics      Encourage discontinuation           of antibiotics    ----------------------------     -----------------------------         PCT >= 0.50 ng/mL              PCT 0.26 - 0.50 ng/mL               AND        <80% decrease in PCT             Encourage initiation of                                             antibiotics       Encourage continuation           of antibiotics    ----------------------------     -----------------------------        PCT >= 0.50 ng/mL                  PCT > 0.50 ng/mL               AND         increase in PCT                  Strongly encourage                                      initiation of antibiotics    Strongly encourage escalation           of antibiotics                                     -----------------------------                                           PCT <= 0.25 ng/mL                                                 OR                                        > 80% decrease in PCT                                      Discontinue / Do not initiate                                             antibiotics  Performed at [facility], [address] [place], [place] [address]   Glucose, capillary     Status: Abnormal   Collection Time:  05/26/24 12:55 AM  Result Value Ref Range   Glucose-Capillary 118 (H) 70 - 99 mg/dL    Comment: Glucose reference range applies only to samples taken after fasting for at least 8 hours.  Comprehensive metabolic panel     Status: Abnormal   Collection Time: 05/26/24  4:23 AM  Result Value Ref Range   Sodium 125 (L) 135 - 145 mmol/L   Potassium 4.4 3.5 - 5.1 mmol/L   Chloride 97 (L) 98 - 111 mmol/L   CO2 13 (L) 22 - 32 mmol/L   Glucose, Bld 111 (H) 70 - 99 mg/dL    Comment: Glucose reference range applies only to samples taken after fasting for at least 8 hours.   BUN 94 (H) 8 - 23 mg/dL   Creatinine, Ser 7.68 (H) 0.61 - 1.24 mg/dL   Calcium   9.6 8.9 - 10.3 mg/dL   Total Protein 6.7 6.5 - 8.1 g/dL   Albumin  3.3 (L) 3.5 - 5.0 g/dL   AST 34 15 - 41 U/L   ALT 72 (H) 0 - 44 U/L   Alkaline Phosphatase 89 38 - 126 U/L   Total Bilirubin 0.8 0.0 - 1.2 mg/dL   GFR, Estimated 29 (L) >60 mL/min    Comment: (NOTE) Calculated using the CKD-EPI Creatinine Equation (2021)    Anion gap 15 5 - 15    Comment: Performed at [facility], [address] [place], [place] [address]  CBC     Status: Abnormal   Collection Time: 05/26/24  4:23 AM  Result Value Ref Range   WBC 12.6 (H) 4.0 - 10.5 K/uL   RBC 4.52 4.22 - 5.81 MIL/uL   Hemoglobin 13.4 13.0 - 17.0 g/dL   HCT 60.7 60.9 - 47.9 %   MCV 86.7 80.0 - 100.0 fL   MCH 29.6 26.0 - 34.0 pg   MCHC 34.2 30.0 - 36.0 g/dL   RDW 84.7 88.4 - 84.4 %   Platelets 297 150 - 400 K/uL   nRBC 0.0 0.0 - 0.2 %    Comment: Performed at [facility], [address] [place], [place] [address]  Lactic acid, plasma     Status: None   Collection Time: 05/26/24  4:23 AM  Result Value Ref Range   Lactic Acid, Venous 1.5 0.5 - 1.9 mmol/L    Comment: Performed at [facility], [address] [place], [place] [address]  Blood gas, venous     Status: Abnormal   Collection Time: 05/26/24  4:23 AM  Result Value Ref Range   pH, Ven 7.33 7.25 - 7.43   pCO2, Ven 27 (L) 44 - 60 mmHg   pO2, Ven 60 (H) 32 - 45 mmHg   Bicarbonate 14.2 (L) 20.0 - 28.0 mmol/L   Acid-base deficit 10.1 (H) 0.0 - 2.0 mmol/L   O2 Saturation 90.5 %   Patient temperature 36.7    Collection site BLOOD LEFT HAND    Drawn by DRAWN BY RN     Comment: Performed at [facility], [address], [place], [place] [address]  Glucose, capillary     Status: Abnormal   Collection Time: 05/26/24  7:35 AM  Result Value Ref Range   Glucose-Capillary 116 (H) 70 - 99 mg/dL    Comment: Glucose reference range applies only to samples taken after fasting for at least 8 hours.  Lactic acid, plasma     Status: None   Collection Time:  05/26/24  8:19 AM  Result Value Ref Range   Lactic Acid, Venous 1.4 0.5 - 1.9 mmol/L    Comment: Performed at [facility], [address], [place], [place] [address]  Glucose, capillary     Status: Abnormal   Collection Time: 05/26/24 12:33 PM  Result Value Ref Range   Glucose-Capillary 142 (H) 70 - 99 mg/dL    Comment: Glucose reference range applies only to samples taken after fasting for at least 8 hours.  Creatinine, fluid (pleural, peritoneal, JP Drainage)     Status: None   Collection Time: 05/26/24 12:44 PM  Result Value Ref Range   Creat, Fluid 2.1 mg/dL    Comment: (NOTE) No normal range established for this test Results should be evaluated in conjunction with serum values    Fluid Type-FCRE JP Drainage     Comment: Performed at [facility], [address], [place], [place] [address]  Glucose, capillary     Status: Abnormal   Collection Time: 05/26/24  4:33 PM  Result Value Ref Range   Glucose-Capillary 109 (H) 70 - 99 mg/dL    Comment: Glucose reference range applies only to samples taken after fasting for at least 8 hours.  Glucose, capillary     Status: Abnormal   Collection Time: 05/26/24  8:52 PM  Result Value Ref Range   Glucose-Capillary 118 (H) 70 - 99 mg/dL    Comment: Glucose reference range applies only to samples taken after fasting for at least 8 hours.  Magnesium      Status: None   Collection Time: 05/27/24  2:15 AM  Result Value Ref Range   Magnesium  2.2 1.7 - 2.4 mg/dL    Comment: Performed at [facility], [address], [place], [place] [address]  Comprehensive metabolic panel with GFR     Status: Abnormal   Collection Time: 05/27/24  2:15 AM  Result Value Ref Range   Sodium 130 (L) 135 - 145 mmol/L   Potassium 4.1 3.5 - 5.1 mmol/L   Chloride 101 98 - 111 mmol/L   CO2 16 (L) 22 - 32 mmol/L   Glucose, Bld 98 70 - 99 mg/dL    Comment: Glucose reference range applies only to samples taken after fasting for at least 8 hours.   BUN  82 (H) 8 - 23 mg/dL   Creatinine, Ser 8.08 (H) 0.61 - 1.24 mg/dL   Calcium  9.5 8.9 - 10.3 mg/dL   Total Protein 6.3 (L) 6.5 - 8.1 g/dL   Albumin  2.9 (L) 3.5 - 5.0 g/dL   AST 25 15 - 41 U/L   ALT 53 (H) 0 - 44 U/L   Alkaline Phosphatase 76 38 - 126 U/L   Total Bilirubin 0.8 0.0 - 1.2 mg/dL   GFR, Estimated 37 (L) >60 mL/min    Comment: (NOTE) Calculated using the CKD-EPI Creatinine Equation (2021)    Anion gap 13 5 - 15    Comment: Performed at [facility], [address] [place], [place] [address]  CBC with Differential/Platelet     Status: Abnormal   Collection Time: 05/27/24  2:15 AM  Result Value Ref Range   WBC 8.4 4.0 - 10.5 K/uL   RBC 4.26 4.22 - 5.81 MIL/uL   Hemoglobin 12.7 (L) 13.0 - 17.0 g/dL   HCT 62.7 (L) 60.9 - 47.9 %   MCV 87.3 80.0 - 100.0 fL   MCH 29.8 26.0 - 34.0 pg   MCHC 34.1 30.0 -  36.0 g/dL   RDW 84.6 88.4 - 84.4 %   Platelets 246 150 - 400 K/uL   nRBC 0.0 0.0 - 0.2 %   Neutrophils Relative % 60 %   Neutro Abs 5.0 1.7 - 7.7 K/uL   Lymphocytes Relative 14 %   Lymphs Abs 1.2 0.7 - 4.0 K/uL   Monocytes Relative 17 %   Monocytes Absolute 1.4 (H) 0.1 - 1.0 K/uL   Eosinophils Relative 7 %   Eosinophils Absolute 0.6 (H) 0.0 - 0.5 K/uL   Basophils Relative 1 %   Basophils Absolute 0.1 0.0 - 0.1 K/uL   Immature Granulocytes 1 %   Abs Immature Granulocytes 0.09 (H) 0.00 - 0.07 K/uL    Comment: Performed at [facility], [address], [place], [place] [address]    Imaging / Studies: Portable chest 1 View Result Date: 05/26/2024 CLINICAL DATA:  Shortness of breath and sepsis EXAM: PORTABLE CHEST 1 VIEW COMPARISON:  05/09/2024 FINDINGS: Low lung volumes accentuate cardiomediastinal silhouette and pulmonary vascularity. No focal consolidation, pleural effusion, or pneumothorax. No displaced rib fractures. IMPRESSION: No active disease. Electronically Signed   By: Norman Gatlin M.D.   On: 05/26/2024 01:11         Medications / Allergies:  per chart  Antibiotics: Anti-infectives (From admission, onward)    Start     Dose/Rate Route Frequency Ordered Stop   05/26/24 1400  piperacillin -tazobactam (ZOSYN ) IVPB 3.375 g        3.375 g 12.5 mL/hr over 240 Minutes Intravenous Every 8 hours 05/26/24 1255           Note: Portions of this report may have been transcribed using voice recognition software. Every effort was made to ensure accuracy; however, inadvertent computerized transcription errors may be present.   Any transcriptional errors that result from this process are unintentional.    Elspeth KYM Schultze, MD, FACS, MASCRS Esophageal, Gastrointestinal & Colorectal Surgery Robotic and Minimally Invasive Surgery  [facility] [address], [address] [place], [place] [address] [phone] Fax [phone] Main  CONTACT INFORMATION: Weekday (9AM-5PM): Call [facility] main office at [phone] Weeknight (5PM-9AM) or Weekend/Holiday: Check EPIC Web Links tab & use AMION (password  [email or web address]) for General Surgery [facility] coverage  Please, DO NOT use SecureChat  (it is not reliable communication to reach operating surgeons & will lead to a delay in care).   Epic staff messaging available for outptient concerns needing 1-2 business day response.      05/27/2024  7:21 AM

## 2024-05-27 NOTE — Progress Notes (Signed)
 Inpatient Rehab Admissions Coordinator:    I met with Pt. And wife to discuss potential CIR admit. Pt. Wants to come and wife can provide 24/7 assist at d/c. I have a bed for him today if cleared by surgery.   [name] Kleine, MS, CCC-SLP Rehab Admissions Coordinator  [phone] (celll) [phone] (office)

## 2024-05-27 NOTE — Discharge Summary (Signed)
 Physician Discharge Summary  Kristopher Thompson FMW:[id number] DOB: 1951-07-25 DOA: 05/25/2024  PCP: [name], MD  Admit date: 05/25/2024 Discharge date: 05/27/2024 30 Day Unplanned Readmission Risk Score    Flowsheet Row Admission (Current) from 05/25/2024 in [place] [facility]  30 Day Unplanned Readmission Risk Score (%) 26.95 Filed at 05/27/2024 1200    This score is the patient's risk of an unplanned readmission within 30 days of being discharged (0 -100%). The score is based on dignosis, age, lab data, medications, orders, and past utilization.   Low:  0-14.9   Medium: 15-21.9   High: 22-29.9   Extreme: 30 and above          Admitted From: Home Disposition: CIR  Recommendations for Outpatient Follow-up:  Follow up with PCP in 1-2 weeks Please obtain BMP/CBC in one week Follow-up with general surgery, timing per general surgery. Follow-up with urology, timing per urology. Please follow up with your PCP on the following pending results: Unresulted Labs (From admission, onward)     Start     Ordered   Signed and Held  Comprehensive metabolic panel  Tomorrow morning,   R        Signed and Held   Signed and Held  Basic metabolic panel  Every Mon,Thu,   R      Signed and Held   Signed and Held  CBC with Differential/Platelet  Every Mon,Thu,   R      Signed and Held              Home Health: None Equipment/Devices: None  Discharge Condition: Stable CODE STATUS: Full code Diet recommendation: Cardiac  Subjective: Seen and examined.  Feeling well.  Wife at the bedside.  Patient has no complaints.  Brief/Interim Summary: Patient with PMH of HTN, HFpEF, CAD, DM2, PE on Eliquis , obesity, prostate cancer, bladder cancer presented to the hospital for treatment of parastomal and incisional incarcerated hernia.  Underwent robotic LOA hernia repair with mesh as well as underwent urostomy with ileal conduit creation revision on 5/30. Had a prolonged stay in  the hospital and was discharged on 7/11. After reaching to the SNF continues to report nausea and vomiting with minimal oral intake.  And then started having difficulty with his ileostomy pouch placement. He was using to soak up the output from the ileostomy he was becoming fatigued and lethargic and therefore was brought to the ER. From there the patient was transferred to [facility] for further care given his recent surgical procedure was here.  Admitted to hospital service, general surgery consulted.  Details below.  AKI on CKD 3B. It appears that the patient's serum creatinine recently was around 2 at the time of the discharge. On presentation serum creatinine was 2.3 suggesting possible AKI. Treated with IV fluid.  Creatinine improved to 1.91 today.   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 Seen on the CT scan.  Started on IV Zosyn  however evaluated by general surgery/Dr. Sheldon and per them, they do not think patient has any signs of infection and did not recommend antibiotics.  Patient received Zosyn  while in the hospital but at discharge, no antibiotics.   Incisional/parastomal, left inguinal incarcerated hernia s/p robotic hernia repair  General Surgery consulted. Patient has multiple drains and wound VAC.  Patient was seen by wound care,. He had some leaking with some skin dermatitis. Wound ostomy nurses came by and repouched it.  Per general surgery, ileostomy effluent looks appropriately thick. Keep him on  FiberCon and loperamide  twice daily.  Loperamide  dose doubled and he can take extra as needed. Goal is [phone] mL out fecal ileostomy a day. Does not seem to be leaking with the repouching on the fecal ileostomy, so general surgery deferred to [facility] if they feel he needs a different type of pouching or not. He does not have a recurrent leak nor infection to my view.SABRA He is stable from discharge from a general surgical and urological standpoint.  Per CIR, they are capable of taking care of  ostomy needs.   Intractable nausea and vomiting. Resolved.   Type II DM. Metformin.   HLD. Continue Crestor .   Obesity Class  Body mass index is 31.49 kg/m.  Placing the pt at higher risk of poor outcomes.  Discharge plan was discussed with patient and/or family member and they verbalized understanding and agreed with it.  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 house supplement   (feeding supplement) PROSource Plus liquid Take 30 mLs by mouth 2 (two) times daily between meals.   feeding supplement Liqd Take 237 mLs by mouth 2 (two) times daily between meals.   nutrition supplement (JUVEN) Pack Take 1 packet by mouth 2 (two) times daily between meals.   acetaminophen  500 MG tablet Commonly known as: TYLENOL  Take 1,000 mg by mouth every 8 (eight) hours as needed for mild pain or headache.   atorvastatin 20 MG tablet Commonly known as: LIPITOR Take 20 mg by mouth at bedtime.   bisacodyl  5 MG EC tablet Commonly known as: DULCOLAX Take 2 tablets (10 mg total) by mouth daily as needed for moderate constipation. What changed: reasons to take this   cyclobenzaprine  5 MG tablet Commonly known as: FLEXERIL  Take 1 tablet (5 mg total) by mouth 3 (three) times daily as needed  for muscle spasms.   diphenhydrAMINE  25 mg capsule Commonly known as: BENADRYL  Take 25 mg by mouth daily as needed for allergies (congestion).   Eliquis  5 MG Tabs tablet Generic drug: apixaban  Take 5 mg by mouth 2 (two) times daily.   feeding supplement (PRO-STAT SUGAR FREE 64) Liqd Take 90 mLs by mouth daily.   Fish Oil 1000 MG Caps Take 1,000 mg by mouth every Monday, Wednesday, and Friday.   hydrOXYzine  25 MG tablet Commonly known as: ATARAX  Take 1 tablet (25 mg total) by mouth 3 (three) times daily as needed for anxiety.   ipratropium 0.03 % nasal spray Commonly known as: ATROVENT  Place 1 spray into both nostrils every 8 (eight) hours as needed (allergies).   loperamide  2 MG capsule Commonly known as: IMODIUM  Take 2 capsules (4 mg total) by mouth 2 (two) times daily. What changed: how much to take  metFORMIN 500 MG tablet Commonly known as: GLUCOPHAGE Take 500 mg by mouth daily.   metoCLOPramide  5 MG tablet Commonly known as: REGLAN  Take 1 tablet (5 mg total) by mouth every 8 (eight) hours as needed for nausea.   midodrine  5 MG tablet Commonly known as: PROAMATINE  Take 1 tablet (5 mg total) by mouth 3 (three) times daily with meals.   MULTIVITAMIN PO Take 1 tablet by mouth daily.   ondansetron  4 MG tablet Commonly known as: Zofran  Take 1 tablet (4 mg total) by mouth daily as needed for nausea or vomiting.   oxyCODONE -acetaminophen  5-325 MG tablet Commonly known as: Percocet Take 1 tablet by mouth every 6 (six) hours as needed for severe pain (pain score 7-10). Can increase to 2 pills at a time if needed   polycarbophil 625 MG tablet Commonly known as: FIBERCON Take 1 tablet (625 mg total) by mouth 2 (two) times daily.   polyethylene glycol powder 17 GM/SCOOP powder Commonly known as: GLYCOLAX /MIRALAX  Take 17 g by mouth daily as needed (constipation).   sodium chloride  0.65 % Soln nasal spray Commonly known as: OCEAN Place 1-2 sprays into both  nostrils every 6 (six) hours as needed for congestion (dry nose).   vitamin B-12 500 MCG tablet Commonly known as: CYANOCOBALAMIN  Take 500 mcg by mouth every Monday, Wednesday, and Friday.   ZINC OXIDE EX Apply 1 application  topically See admin instructions. Cleanse sacrum with wound cleanser; pat dry; apply zinc oxide ointment and leave open to air. Every day shift for wound care.        Follow-up Information     [name], MD Follow up in 1 week(s).   Specialty: Internal Medicine Contact information: [address] [place] [address] [phone]                Allergies  Allergen Reactions   Demerol  [Meperidine ] Nausea And Vomiting    Consultations: General surgery and wound care   Procedures/Studies: Portable chest 1 View Result Date: 05/26/2024 CLINICAL DATA:  Shortness of breath and sepsis EXAM: PORTABLE CHEST 1 VIEW COMPARISON:  05/09/2024 FINDINGS: Low lung volumes accentuate cardiomediastinal silhouette and pulmonary vascularity. No focal consolidation, pleural effusion, or pneumothorax. No displaced rib fractures. IMPRESSION: No active disease. Electronically Signed   By: Norman Gatlin M.D.   On: 05/26/2024 01:11   IR URETERAL STENT RIGHT NEW ACCESS W/SEP NEPHROSTOMY CATH Result Date: 05/14/2024 INDICATION: Patient with a history of a radical cystectomy/ileal conduit urinary diversion with a known leak of the ileal conduit. Planned retrograde placement of a ureteral stent and replacement of the percutaneous nephrostomy tube for urinary diversion away from the leak. EXAM: Nephrostomy tube exchange, antegrade ureterogram with fluoroscopy, new placement retrograde ureteral stent COMPARISON:  None Available. MEDICATIONS: None; The antibiotic was administered in an appropriate time frame prior to skin puncture. ANESTHESIA/SEDATION: Moderate (conscious) sedation was employed during this procedure. A total of Versed  4 mg and Fentanyl  200 mcg was administered  intravenously by the radiology nurse. Total intra-service moderate Sedation Time: 44 minutes. The patient's level of consciousness and vital signs were monitored continuously by radiology nursing throughout the procedure under my direct supervision. CONTRAST:  35 mL Omnipaque  300-administered into the collecting system(s) FLUOROSCOPY: Radiation Exposure Index (as provided by the fluoroscopic device): 21 minutes 12 seconds 3,401 mGy Kerma COMPLICATIONS: None immediate. PROCEDURE: Informed written consent was obtained from the patient after a thorough discussion of the procedural risks, benefits and alternatives. All questions were addressed. Maximal Sterile Barrier  Technique was utilized including caps, mask, sterile gowns, sterile gloves, sterile drape, hand hygiene and skin antiseptic. A timeout was performed prior to the initiation of the procedure. With patient in a left lateral decubitus position the right lower quadrant and right flank were prepped and draped in the usual sterile fashion. Dilute contrast was injected into the pre-existing percutaneous nephrostomy tube in order to perform a pyelogram the and visualized anatomy as well as identifying location of the catheter. Once this was established, retention suture and sterile dressing were removed the catheter was cut. An 018 and an 035 guidewire were advanced under fluoroscopy into the renal pelvis. The catheter was then removed over a guidewire. The 018 safety wire was then clamped external to the patient in order to lock its position. An angled glide catheter was then advanced over the guidewire and placed in the renal pelvis. And antegrade ureterogram was then performed by injecting the catheter in the renal pelvis and evaluating the right ureter. The ureter is dilated and the add leak near the anastomosis is identified. Using standard catheter and guidewire technique, the catheter and guidewire were advanced in antegrade fashion down the ureter past the  leak and into the ileal conduit. Contrast was then injected into the ileum in order to identify anatomy and further promote advancing the catheter under video x-ray. Using a combination of AP and lateral imaging, fluoroscopy was then done to continue to advance the guidewire and catheter through the ileum and out the ostomy. Once the guidewire was identified out the ostium it was then removed in order to have further purchase at the ostomy. The catheter was then advanced over the guidewire and out the patient through the ostomy. The guidewire was then removed leaving the catheter in position and a new guidewire was placed retrograde through the ureter from the ostomy. This was done under video x-rays and monitor for when the guidewire was identified at the renal pelvis. A 10 French pigtail catheter was then advanced after the 035 catheter was removed, over the guidewire, in retrograde fashion, and coiled in the renal pelvis there by placing a retrograde ureteral stent. The locking pigtail was engaged and the catheter was left in the ostomy. The safety wire was then used to advance a new 10 [place] percutaneous nephrostomy tube over the guidewire and back in percutaneously to the renal pelvis. The catheter was then coiled in position and retention suture was placed. The percutaneous nephrostomy tube was then connected to gravity drainage. IMPRESSION: Fluoroscopic guided replacement of percutaneous nephrostomy tube as well as antegrade pyelogram and contrast injection for the ureter on the right side and subsequent placement of a new retrograde 10 French ureteral stent. The patient will return for routine exchanges of both catheters to be scheduled for a later date. Electronically Signed   By: Cordella Banner   On: 05/14/2024 09:35   IR URETERAL STENT PLACEMENT EXISTING ACCESS LEFT Result Date: 05/14/2024 INDICATION: Patient with a history of a radical cystectomy/ileal conduit urinary diversion with a known leak of  the ileal conduit. Planned retrograde placement of a ureteral stent and replacement of the percutaneous nephrostomy tube for urinary diversion away from the leak. EXAM: Nephrostomy tube exchange, antegrade ureterogram with fluoroscopy, new placement retrograde ureteral stent COMPARISON:  None Available. MEDICATIONS: None; The antibiotic was administered in an appropriate time frame prior to skin puncture. ANESTHESIA/SEDATION: Moderate (conscious) sedation was employed during this procedure. A total of Versed  4 mg and Fentanyl  200 mcg was administered intravenously  by the radiology nurse. Total intra-service moderate Sedation Time: 44 minutes. The patient's level of consciousness and vital signs were monitored continuously by radiology nursing throughout the procedure under my direct supervision. CONTRAST:  35 mL Omnipaque  300-administered into the collecting system(s) FLUOROSCOPY: Radiation Exposure Index (as provided by the fluoroscopic device): 21 minutes 12 seconds 3,401 mGy Kerma COMPLICATIONS: None immediate. PROCEDURE: Informed written consent was obtained from the patient after a thorough discussion of the procedural risks, benefits and alternatives. All questions were addressed. Maximal Sterile Barrier Technique was utilized including caps, mask, sterile gowns, sterile gloves, sterile drape, hand hygiene and skin antiseptic. A timeout was performed prior to the initiation of the procedure. With patient in a left lateral decubitus position the right lower quadrant and right flank were prepped and draped in the usual sterile fashion. Dilute contrast was injected into the pre-existing percutaneous nephrostomy tube in order to perform a pyelogram the and visualized anatomy as well as identifying location of the catheter. Once this was established, retention suture and sterile dressing were removed the catheter was cut. An 018 and an 035 guidewire were advanced under fluoroscopy into the renal pelvis. The  catheter was then removed over a guidewire. The 018 safety wire was then clamped external to the patient in order to lock its position. An angled glide catheter was then advanced over the guidewire and placed in the renal pelvis. And antegrade ureterogram was then performed by injecting the catheter in the renal pelvis and evaluating the right ureter. The ureter is dilated and the add leak near the anastomosis is identified. Using standard catheter and guidewire technique, the catheter and guidewire were advanced in antegrade fashion down the ureter past the leak and into the ileal conduit. Contrast was then injected into the ileum in order to identify anatomy and further promote advancing the catheter under video x-ray. Using a combination of AP and lateral imaging, fluoroscopy was then done to continue to advance the guidewire and catheter through the ileum and out the ostomy. Once the guidewire was identified out the ostium it was then removed in order to have further purchase at the ostomy. The catheter was then advanced over the guidewire and out the patient through the ostomy. The guidewire was then removed leaving the catheter in position and a new guidewire was placed retrograde through the ureter from the ostomy. This was done under video x-rays and monitor for when the guidewire was identified at the renal pelvis. A 10 French pigtail catheter was then advanced after the 035 catheter was removed, over the guidewire, in retrograde fashion, and coiled in the renal pelvis there by placing a retrograde ureteral stent. The locking pigtail was engaged and the catheter was left in the ostomy. The safety wire was then used to advance a new 10 [place] percutaneous nephrostomy tube over the guidewire and back in percutaneously to the renal pelvis. The catheter was then coiled in position and retention suture was placed. The percutaneous nephrostomy tube was then connected to gravity drainage. IMPRESSION: Fluoroscopic  guided replacement of percutaneous nephrostomy tube as well as antegrade pyelogram and contrast injection for the ureter on the right side and subsequent placement of a new retrograde 10 French ureteral stent. The patient will return for routine exchanges of both catheters to be scheduled for a later date. Electronically Signed   By: Cordella Banner   On: 05/14/2024 09:35   DG Loopogram Result Date: 05/12/2024 CLINICAL DATA:  [id number] Complication of Ileal conduit (HCC) [id number] Patient  with ileal conduit, s/p recent nephrostogram with Dr. Luverne at time of nephrostomy exchange. Concern for conduit leak due to presence of urine in surgical drains. Team has requested introduction of contrast into the conduit to be followed by CT imaging. EXAM: WATER  SOLUBLE CONTRAST LOOPOGRAM TECHNIQUE: The urostomy bag was removed as his current device did not allow entry from port used to empty the collection bag. The skin was cleansed and a scout image was obtained. An 8 Fr Foley was inflated outside of the urostomy. The tip was inserted and the balloon used to gently occlude the stoma. Omni 300 was introduced through the catheter. This technique was not adequate to prevent leakage. After discussion with Dr. Hughes, the 8 Fr Foley was introduced several cm into the stoma and balloon inflated with 5 mL saline within the stoma. Contrast was then introduced without leakage. Approximately 70 mL of water  soluble contrast ws introduce to the system; at that time, leakage around the stoma was noted and instillation was stopped. Fluoroscopy images were obtained throughout the procedure. The balloon was completely deflated and removed from the stoma. FLUOROSCOPY: Radiation Exposure Index and estimated peak skin dose (PSD); Reference air kerma (RAK), 6.5 mGy. Kerma-area product (KAP), 217.5 uGy*m. COMPARISON:  05/11/24 Nephrostogram FINDINGS: 1. Contrast introduced via catheter is seen within the ileal conduit and retrograde opacification  of the RIGHT renal collecting system. 2. The LEFT renal collecting system did not opacify. Patient to immediately transfer to CT imaging area for CT AP for further evaluation. IMPRESSION: 1. Contrast filling within the ileal conduit and retrograde opacification of the RIGHT renal collecting system. 2. The LEFT renal collecting system did not opacify. Electronically Signed   By: Thom Hughes M.D.   On: 05/12/2024 16:33   CT ABDOMEN PELVIS WO CONTRAST Result Date: 05/12/2024 CLINICAL DATA:  Status post prior cystectomy with ileal conduit formation. Recent surgery for bowel perforation. Suspected urine leak near the level ileal conduit due to surgical drain fluid return demonstrating elevated creatinine. EXAM: CT ABDOMEN AND PELVIS WITHOUT CONTRAST TECHNIQUE: Multidetector CT imaging of the abdomen and pelvis was performed following the standard protocol without IV contrast. Prior to the CT study, contrast was injected via a right lower quadrant urostomy into the ileal conduit RADIATION DOSE REDUCTION: This exam was performed according to the departmental dose-optimization program which includes automated exposure control, adjustment of the mA and/or kV according to patient size and/or use of iterative reconstruction technique. COMPARISON:  Recent CT studies on 05/03/2024 and 04/19/2024 FINDINGS: Lower chest: No acute abnormality. Hepatobiliary: No focal liver abnormality is seen. Status post cholecystectomy. No biliary dilatation. Pancreas: Unremarkable. No pancreatic ductal dilatation or surrounding inflammatory changes. Spleen: Normal in size without focal abnormality. Adrenals/Urinary Tract: Bilateral percutaneous nephrostomy tubes are present with pigtail portions formed in the renal pelvis bilaterally. There is some distension of the right renal collecting system with contrast due to free reflux of contrast injected into the ileal conduit with contrast opacification of the entire right ureter and renal  collecting system. 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 This stenosis was characterized by nephrostogram yesterday. Status post cystectomy. Stomach/Bowel: Ileal conduit is well distended with injected contrast. The urostomy itself appears focally stenotic just before the abdominal wall within the subcutaneous fat. There then is another segment of stenosis right at the abdominal wall and extending just into the peritoneal cavity where there  may be a very subtle pinpoint leak extending laterally over approximately 2 cm and paralleling just superior to one of the surgical drains that originates in the fall left lateral abdomen and ultimately crosses over into the far right lateral lower abdomen. There is an additional more obvious leak along the inferior aspect of the conduit on axial images 75-83 and also seen well on coronal reconstructions beginning as a thin column of extravasated injected contrast measuring approximately 4-5 mm in width and extending into the right lower abdominal wall at the level of one of the transversely oriented surgical drains. No evidence of bowel obstruction, ileus or free intraperitoneal air. Feeding tube extends into the mid stomach. Vascular/Lymphatic: No significant vascular findings are present. No enlarged abdominal or pelvic lymph nodes. Reproductive: No significant findings. Other: Edema of the right lower muscle wall and some ill-defined fluid in the right lower abdominal wall which appears stable since the prior CT. Musculoskeletal: No acute or significant osseous findings. IMPRESSION: 1. 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 There then is another segment of stenosis right at the abdominal wall and extending just into the peritoneal cavity where  there may be a very subtle pinpoint leak extending laterally over approximately 2 cm and paralleling just superior to one of the surgical drains that originates in the fall left lateral abdomen and ultimately crosses over into the far right lateral lower abdomen. 2. There is an additional more obvious leak along the inferior aspect of the conduit beginning as a thin column of extravasated injected contrast measuring approximately 4-5 mm in width and extending into the right lower abdominal wall at the level of one of the transversely oriented surgical drains. 3. Bilateral percutaneous nephrostomy tubes are present with pigtail portions formed in the renal pelvis bilaterally. There is some distension of the right renal collecting system with contrast due to free reflux of contrast injected into the ileal conduit with contrast opacification of the entire right ureter and renal collecting system. 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 This stenosis was characterized by nephrostogram yesterday. 4. Edema of the right lower muscle wall and some ill-defined fluid in the right lower abdominal wall which appears stable since the prior CT. Electronically Signed   By: Marcey Moan M.D.   On: 05/12/2024 14:32   IR NEPHROSTOMY EXCHANGE BILATERAL Result Date: 05/11/2024 INDICATION: Status post prior cystectomy with ileal conduit formation. Recent bowel surgery for bowel perforation. Concern for possible urine leak. EXAM: 1. LEFT PERCUTANEOUS NEPHROSTOMY TUBE EXCHANGE UNDER FLUOROSCOPY INCLUDING FULL ANTEGRADE NEPHROSTOGRAM 2. RIGHT PERCUTANEOUS NEPHROSTOMY TUBE EXCHANGE UNDER FLUOROSCOPY INCLUDING FULL ANTEGRADE NEPHROSTOGRAM COMPARISON:  None Available. MEDICATIONS: None ANESTHESIA/SEDATION: None CONTRAST:  50 mL Omnipaque  300-administered into the collecting system(s) FLUOROSCOPY: Radiation Exposure Index (as  provided by the fluoroscopic device): 111 mGy Kerma COMPLICATIONS: None immediate. PROCEDURE: Informed written consent was obtained from the patient after a thorough discussion of the procedural risks, benefits and alternatives. All questions were addressed. Maximal Sterile Barrier Technique was utilized including caps, mask, sterile gowns, sterile gloves, sterile drape, hand hygiene and skin antiseptic. A timeout was performed prior to the initiation of the procedure. Both percutaneous nephrostomy tubes were injected with contrast material under fluoroscopy and multiple fluoroscopy loops and images were saved. The left percutaneous nephrostomy tube was then removed  over a guidewire and exchanged for a 5 French catheter over a wire. The catheter was further advanced into the left ureter and additional nephrostogram performed to assess ureteral patency. A new 10 French percutaneous nephrostomy tube was then formed over a guidewire in the left renal pelvis A new 10 French right percutaneous nephrostomy tube was then exchanged over a guidewire and formed in the renal pelvis. Both nephrostomy tubes were secured at the skin with Prolene retention sutures, adhesive StatLock devices and attached to new gravity drainage bags. FINDINGS: Injection of the left percutaneous nephrostomy tube demonstrates retraction of the tube which is peripherally located in a lower pole calyx. Contrast injection demonstrates left-sided hydronephrosis and poor drainage into the ileal conduit with suggestion of a distal ureteral stricture. This was confirmed by advancement of a 5 French catheter into the distal ureter with contrast injection demonstrating a high-grade stricture but flow of contrast through the stricture and into the ileal conduit once a catheter was advanced near the level of the stricture. There is no evidence of left-sided urine leak. The left nephrostomy tube was replaced with a new 10 French catheter formed at the level of  the renal pelvis. A right nephrostogram demonstrates retraction the right nephrostomy tube into a lower pole calyx. Contrast injection demonstrates good flow of contrast into the right ureter and ileal conduit without evidence of ureteral stricture or urine leak. The right nephrostomy tube was replaced with a new 10 French catheter formed at the level of the renal pelvis. IMPRESSION: 1. 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 Injection of contrast at the level of the distal ureter does show flow through the stricture and into the ileal conduit. No left-sided urine leak demonstrated. 2. Right nephrostogram demonstrates normally patent ureter and ureteral anastomosis with the ileal conduit. No right-sided urine leak demonstrated. 3. Both nephrostomy tubes had retracted into lower pole calices and were replaced with new 10 French catheters formed at the level of the renal pelvis bilaterally. Electronically Signed   By: Marcey Moan M.D.   On: 05/11/2024 12:35   DG CHEST PORT 1 VIEW Result Date: 05/09/2024 CLINICAL DATA:  10026 Shortness of breath 10026 EXAM: PORTABLE CHEST - 1 VIEW COMPARISON:  04/22/2024 FINDINGS: Interval extubation. Feeding tube placement at least as far as the stomach, tip not seen. Stable right arm PICC. Interval removal of left IJ dialysis catheter. Lungs clear. Heart size and mediastinal contours are within normal limits. No effusion. Visualized bones unremarkable. IMPRESSION: Interval extubation. No acute cardiopulmonary disease. Electronically Signed   By: JONETTA Faes M.D.   On: 05/09/2024 11:53   IR NEPHROSTOMY PLACEMENT BILATERAL Result Date: 05/04/2024 INDICATION: 73 year old male with history of cystectomy with ileal conduit presenting with bilateral distal renal obstruction. EXAM: 1.  ULTRASOUND GUIDANCE FOR PUNCTURE OF THE left RENAL COLLECTING SYSTEM 2. Left PERCUTANEOUS NEPHROSTOMY TUBE PLACEMENT. 3. ULTRASOUND GUIDANCE FOR PUNCTURE OF THE right RENAL COLLECTING SYSTEM 4. Right PERCUTANEOUS NEPHROSTOMY TUBE PLACEMENT. COMPARISON:  None Available. MEDICATIONS: The patient was currently receiving intravenous antibiotics as an inpatient. No additional antibiotics were administered. ANESTHESIA/SEDATION: Moderate (conscious) sedation was employed during this procedure. A total of Versed  2 mg and Fentanyl  100 mcg was administered intravenously. Moderate Sedation Time: 17 minutes. The patient's level of consciousness and vital signs were monitored continuously by radiology nursing throughout the procedure under  my direct supervision. CONTRAST:  Fifteen mL Isovue  300 - administered into the renal collecting system FLUOROSCOPY TIME:  Eleven mGy reference air kerma COMPLICATIONS: None immediate. PROCEDURE: The procedure, risks, benefits, and alternatives were explained to the patient. Questions regarding the procedure were encouraged and answered. The patient understands and consents to the procedure. A timeout was performed prior to the initiation of the procedure. The bilateral flank regions were prepped and draped in the usual sterile fashion and a sterile drape was applied covering the operative field. A sterile gown and sterile gloves were used for the procedure. Local anesthesia was provided with 1% Lidocaine  with epinephrine . Ultrasound was used to localize the left kidney. Under direct ultrasound guidance, a 20 gauge needle was advanced into the renal collecting system. An ultrasound image documentation was performed. Access within the collecting system was confirmed with the efflux of urine followed by limited contrast injection. Over a Nitrex wire, the tract was dilated with an Accustick stent. Next, under intermittent fluoroscpic guidance and over a short Amplatz wire, the track was dilated  ultimately allowing placement of a 10-French percutaneous nephrostomy catheter which was advanced to the level of the renal pelvis where the coil was formed and locked. Contrast was injected and several spot fluoroscopic images were obtained in various obliquities. The catheter was secured at the skin with a Prolene retention suture and stat lock device and connected to a gravity bag was placed. Ultrasound was used to localize the right kidney. Under direct ultrasound guidance, a 20 gauge needle was advanced into the renal collecting system. An ultrasound image documentation was performed. Access within the collecting system was confirmed with the efflux of urine followed by limited contrast injection. Over a Nitrex wire, the tract was dilated with an Accustick stent. Next, under intermittent fluoroscpic guidance and over a short Amplatz wire, the track was dilated ultimately allowing placement of a 10-French percutaneous nephrostomy catheter which was advanced to the level of the renal pelvis where the coil was formed and locked. Contrast was injected and several spot fluoroscopic images were obtained in various obliquities. The catheter was secured at the skin with a Prolene retention suture and stat lock device and connected to a gravity bag was placed. Dressings were applied. The patient tolerated procedure well without immediate postprocedural complication. FINDINGS: Ultrasound scanning demonstrates a moderate to severely dilated bilateral collecting systems. Under a combination of ultrasound and fluoroscopic guidance, a posterior inferior calix was targeted allowing placement of bilateral 10-French percutaneous nephrostomy catheters with ends coiled and locked within the renal pelves. Contrast injection confirmed appropriate positioning. IMPRESSION: Successful ultrasound and fluoroscopic guided placement of a bilateral 10 French percutaneous nephrostomy tubes. Ester Sides, MD [facility] Electronically Signed   By: Ester Sides M.D.   On: 05/04/2024 16:17   CT ABDOMEN PELVIS W CONTRAST Result Date: 05/03/2024 CLINICAL DATA:  Bladder dysfunction (Ped [id number]). 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 EXAM: CT ABDOMEN AND PELVIS WITH CONTRAST TECHNIQUE: Multidetector CT imaging of the abdomen and pelvis was performed using the standard protocol following bolus administration of intravenous contrast. RADIATION DOSE REDUCTION: This exam was performed according to the departmental dose-optimization program which includes automated exposure control, adjustment of the mA and/or kV according to patient size and/or use of iterative reconstruction technique. CONTRAST:  75mL OMNIPAQUE  IOHEXOL  350 MG/ML SOLN COMPARISON:  CT abdomen 04/19/2024 FINDINGS: Lower chest: Coronary artery calcification.  No acute abnormality. Hepatobiliary: No focal liver abnormality. Status  post cholecystectomy. No biliary dilatation. Pancreas: Coarse calcifications throughout the parenchyma. No focal lesion. Normal pancreatic contour. No surrounding inflammatory changes. No main pancreatic ductal dilatation. Spleen: Normal in size without focal abnormality. Adrenals/Urinary Tract: No adrenal nodule bilaterally. Bilateral kidneys enhance symmetrically. Persistent severe left hydroureteronephrosis and moderate right hydroureteronephrosis. Renal parenchymal thinning and scarring on the left. Status post cystectomy. Right lower abdominal wall ileal conduit formation (3:81). Stomach/Bowel: Right abdominal lower anterior abdominal wall and ileostomy formation. PO contrast opacifies the large bowel. Enteric tube with tip and side port within the gastric lumen. Stomach is within normal limits. No evidence of bowel wall thickening or dilatation. Colonic diverticulosis. Appendix appears normal. Vascular/Lymphatic: No abdominal aorta or iliac aneurysm. Moderate atherosclerotic  plaque of the aorta and its branches. No abdominal, pelvic, or inguinal lymphadenopathy. Reproductive: Prostatectomy. Other: Bilateral lower abdominal wall surgical drains again noted with tips terminating within the right lower quadrant. Interval development of an organized fluid collection measuring 4.3 x 3.6 cm within the right anterolateral abdominal musculature with finding that appears to be contiguous with the peritoneum (3:46). Associated free fluid along the right lateral anterior abdomen that appears to possibly be originating from the ileum where ileal bowel sutures are noted within the right abdomen (3:68). Question fistulization and dehiscence of the ileal bowel sutures with the right abdomen of soft tissues (3:69). Associated free fluid along the right lateral anterior abdomen with extension into the soft tissue and fistulization not excluded between the abscess and the dermis (3:60). No free gas. Musculoskeletal: Possible developing supraumbilical tiny ventral hernia containing fluid. No suspicious lytic or blastic osseous lesions. No acute displaced fracture. IMPRESSION: 1. Interval development of an organized fluid collection measuring 4.3 x 3.6 cm within the right anterolateral abdominal musculature with finding that appears to be contiguous with the peritoneum. Extensive inflammatory changes along the abdomen and abdominal soft tissues on the right. Question fistulization and dehiscence of the ileal bowel sutures with the right abdomen of soft tissues. 2. Ileal conduit appears to course adjacent the inflammatory changes with no urothelial thickening to suggest superimposed renal infection. 3. Possible developing supraumbilical tiny ventral hernia containing fluid. 4. Chronic pancreatitis with no findings of acute pancreatitis. 5. Colonic diverticulosis with no acute diverticulitis. 6. 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 7.  Persistent severe left hydroureteronephrosis and moderate right hydroureteronephrosis. 8.  Aortic Atherosclerosis (ICD10-I70.0). Electronically Signed   By: Morgane  Naveau M.D.   On: 05/03/2024 16:42   DG Abd Portable 1V Result Date: 04/27/2024 CLINICAL DATA:  Feeding tube placement EXAM: PORTABLE ABDOMEN - 1 VIEW COMPARISON:  04/15/2024 FINDINGS: Frontal view of the lower chest and upper abdomen demonstrates enteric catheter tip projecting over the gastric body. Bowel gas pattern is unremarkable. No acute bony abnormalities. IMPRESSION: 1. Enteric catheter tip projecting over the gastric body. Electronically Signed   By: Ozell Daring M.D.   On: 04/27/2024 15:11     Discharge Exam: Vitals:   05/27/24 0726 05/27/24 1156  BP: (!) 92/55 (!) 85/50  Pulse: 88 100  Resp:    Temp: 98.8 F (37.1 C)   SpO2: 100% 100%   Vitals:   05/27/24 0033 05/27/24 0341 05/27/24 0726 05/27/24 1156  BP: 93/60 (!) 92/59 (!) 92/55 (!) 85/50  Pulse: 91 87 88 100  Resp: 19 20    Temp: 98.4 F (36.9 C) 98.4 F (36.9 C) 98.8 F (37.1 C)   TempSrc:   Oral   SpO2: 100%  100% 100% 100%  Weight:      Height:        General: Pt is alert, awake, not in acute distress Cardiovascular: RRR, S1/S2 +, no rubs, no gallops Respiratory: CTA bilaterally, no wheezing, no rhonchi Abdominal: Soft, NT, ND, bowel sounds +, has ostomy bag which has some liquid and some solid stool, has urostomy bag and 2 other drains in the abdomen.  Has chronic right lower quadrant abdominal tenderness. Extremities: no edema, no cyanosis    The results of significant diagnostics from this hospitalization (including imaging, microbiology, ancillary and laboratory) are listed below for reference.     Microbiology: Recent Results (from the past 240 hours)  Culture, blood (Routine X 2) w Reflex to ID Panel     Status: None (Preliminary result)   Collection Time: 05/26/24 12:28 AM   Specimen: BLOOD LEFT ARM  Result Value Ref Range Status    Specimen Description BLOOD LEFT ARM  Final   Special Requests   Final    BOTTLES DRAWN AEROBIC AND ANAEROBIC Blood Culture adequate volume   Culture   Final    NO GROWTH 1 DAY Performed at [facility], [address], [place], [place] [address]    Report Status PENDING  Incomplete  Culture, blood (Routine X 2) w Reflex to ID Panel     Status: None (Preliminary result)   Collection Time: 05/26/24 12:53 AM   Specimen: BLOOD RIGHT HAND  Result Value Ref Range Status   Specimen Description BLOOD RIGHT HAND  Final   Special Requests   Final    BOTTLES DRAWN AEROBIC AND ANAEROBIC Blood Culture adequate volume   Culture   Final    NO GROWTH 1 DAY Performed at [facility], [address] [place], [place] [address]    Report Status PENDING  Incomplete     Labs: BNP (last 3 results) Recent Labs    04/13/24 1835 05/25/24 2338  BNP 127.6* 24.6   Basic Metabolic Panel: Recent Labs  Lab 05/21/24 0650 05/21/24 0651 05/22/24 0630 05/26/24 0053 05/26/24 0423 05/27/24 0215  NA 127* 126* 127* 125* 125* 130*  K 4.0 4.0 3.9 4.5 4.4 4.1  CL 96* 97* 97* 98 97* 101  CO2 17* 18* 17* 14* 13* 16*  GLUCOSE 99 100* 108* 114* 111* 98  BUN 85* 84* 91* 93* 94* 82*  CREATININE 2.10* 2.14* 2.09* 2.24* 2.31* 1.91*  CALCIUM  10.3 10.3 10.2 9.8 9.6 9.5  MG  --  2.4 2.5* 2.4  --  2.2  PHOS 5.6* 5.5* 5.4* 5.2*  --  3.8   Liver Function Tests: Recent Labs  Lab 05/21/24 0651 05/22/24 0630 05/26/24 0053 05/26/24 0423 05/27/24 0215  AST 53* 54* 42* 34 25  ALT 93* 98* 81* 72* 53*  ALKPHOS 86 89 96 89 76  BILITOT 0.7 0.8 0.4 0.8 0.8  PROT 7.2 7.2 6.9 6.7 6.3*  ALBUMIN  3.1* 3.3* 3.1* 3.3* 2.9*   No results for input(s): LIPASE, AMYLASE in the last 168 hours. No results for input(s): AMMONIA in the last 168 hours. CBC: Recent Labs  Lab 05/21/24 0651 05/22/24 0630 05/26/24 0053 05/26/24 0423 05/27/24 0215  WBC 10.0 10.1 11.9* 12.6* 8.4  NEUTROABS 5.6 6.0 7.5  --   5.0  HGB 13.8 13.9 14.9 13.4 12.7*  HCT 41.4 40.9 45.1 39.2 37.2*  MCV 89.2 88.7 90.2 86.7 87.3  PLT 331 326 259 297 246   Cardiac Enzymes: No results for input(s): CKTOTAL,  CKMB, CKMBINDEX, TROPONINI in the last 168 hours. BNP: Invalid input(s): POCBNP CBG: Recent Labs  Lab 05/26/24 1233 05/26/24 1633 05/26/24 2052 05/27/24 0725 05/27/24 1153  GLUCAP 142* 109* 118* 106* 139*   D-Dimer No results for input(s): DDIMER in the last 72 hours. Hgb A1c No results for input(s): HGBA1C in the last 72 hours. Lipid Profile No results for input(s): CHOL, HDL, LDLCALC, TRIG, CHOLHDL, LDLDIRECT in the last 72 hours. Thyroid  function studies Recent Labs    05/26/24 0053  TSH 1.457   Anemia work up No results for input(s): VITAMINB12, FOLATE, FERRITIN, TIBC, IRON , RETICCTPCT in the last 72 hours. Urinalysis    Component Value Date/Time   COLORURINE YELLOW 04/13/2024 1018   APPEARANCEUR CLOUDY (A) 04/13/2024 1018   LABSPEC 1.016 04/13/2024 1018   PHURINE 6.0 04/13/2024 1018   GLUCOSEU NEGATIVE 04/13/2024 1018   HGBUR LARGE (A) 04/13/2024 1018   BILIRUBINUR NEGATIVE 04/13/2024 1018   KETONESUR 5 (A) 04/13/2024 1018   PROTEINUR 100 (A) 04/13/2024 1018   NITRITE NEGATIVE 04/13/2024 1018   LEUKOCYTESUR LARGE (A) 04/13/2024 1018   Sepsis Labs Recent Labs  Lab 05/22/24 0630 05/26/24 0053 05/26/24 0423 05/27/24 0215  WBC 10.1 11.9* 12.6* 8.4   Microbiology Recent Results (from the past 240 hours)  Culture, blood (Routine X 2) w Reflex to ID Panel     Status: None (Preliminary result)   Collection Time: 05/26/24 12:28 AM   Specimen: BLOOD LEFT ARM  Result Value Ref Range Status   Specimen Description BLOOD LEFT ARM  Final   Special Requests   Final    BOTTLES DRAWN AEROBIC AND ANAEROBIC Blood Culture adequate volume   Culture   Final    NO GROWTH 1 DAY Performed at [facility], [address], [place], [place] [address]     Report Status PENDING  Incomplete  Culture, blood (Routine X 2) w Reflex to ID Panel     Status: None (Preliminary result)   Collection Time: 05/26/24 12:53 AM   Specimen: BLOOD RIGHT HAND  Result Value Ref Range Status   Specimen Description BLOOD RIGHT HAND  Final   Special Requests   Final    BOTTLES DRAWN AEROBIC AND ANAEROBIC Blood Culture adequate volume   Culture   Final    NO GROWTH 1 DAY Performed at [facility], [address] [place], [place] [address]    Report Status PENDING  Incomplete    FURTHER DISCHARGE INSTRUCTIONS:   Get Medicines reviewed and adjusted: Please take all your medications with you for your next visit with your Primary MD   Laboratory/radiological data: Please request your Primary MD to go over all hospital tests and procedure/radiological results at the follow up, please ask your Primary MD to get all Hospital records sent to his/her office.   In some cases, they will be blood work, cultures and biopsy results pending at the time of your discharge. Please request that your primary care M.D. goes through all the records of your hospital data and follows up on these results.   Also Note the following: If you experience worsening of your admission symptoms, develop shortness of breath, life threatening emergency, suicidal or homicidal thoughts you must seek medical attention immediately by calling 911 or calling your MD immediately  if symptoms less severe.   You must read complete instructions/literature along with all the possible adverse reactions/side effects for all the Medicines you take and that have been prescribed to you. Take  any new Medicines after you have completely understood and accpet all the possible adverse reactions/side effects.    patient was instructed, not to drive, operate heavy machinery, perform activities at heights, swimming or participation in water  activities or provide baby-sitting services while on Pain, Sleep and  Anxiety Medications; until their outpatient Physician has advised to do so again. Also recommended to not to take more than prescribed Pain, Sleep and Anxiety Medications.  It is not advisable to combine anxiety, sleep and pain medications without talking with your primary care provider.     Wear Seat belts while driving.   Please note: You were cared for by a hospitalist during your hospital stay. Once you are discharged, your primary care physician will handle any further medical issues. Please note that NO REFILLS for any discharge medications will be authorized once you are discharged, as it is imperative that you return to your primary care physician (or establish a relationship with a primary care physician if you do not have one) for your post hospital discharge needs so that they can reassess your need for medications and monitor your lab values  Time coordinating discharge: Over 30 minutes  SIGNED:   Fredia Skeeter, MD  [facility] 05/27/2024, 12:31 PM *Please note that this is a verbal dictation therefore any spelling or grammatical errors are due to the Dragon [employer] One system interpretation. If 7PM-7AM, please contact night-coverage [email or web address]

## 2024-05-27 NOTE — Evaluation (Signed)
 Physical Therapy Evaluation Patient Details Name: Kristopher Thompson MRN: [id number] DOB: [birth year] Today's Date: 05/27/2024  History of Present Illness  73 y.o. male presents to [facility] 05/25/24 from SNF due to increased output of ostomy and need for higher level of care. Possible infection of ileostomy site. Extensive prior stay 5/30-7/1 for treatment of parastomal and incisional incarcerated hernia. PMHx: HTN, chronic diastolic congestive heart failure, CAD, DM2, pulmonary embolism on Eliquis , anemia, obesity, prostate cancer, bladder cancer, left inguinal incarcerated hernia s/p robotic hernia repair, urostomy 2/ ileal conduit creation revision   Clinical Impression  Pt in bed upon arrival and agreeable to PT eval. Prior to last hospital admit, pt was independent for mobility with no AD. Since d/c to SNF, pt has had limited mobility and has been using a RW. Pt presents with generalized weakness, decreased activity tolerance, impaired balance, and impaired mobility. Pt required ModA for bed mobility and MinA to perform step-pivot with RW. +2 needed for safety with lines. Pt has 24/7 level of assist available at home. Recommending post-acute rehab >3hrs to work towards independence with mobility. Pt would benefit from acute skilled PT with current functional limitations listed below (see PT Problem List). Acute PT to follow.         If plan is discharge home, recommend the following: Assistance with cooking/housework;Assist for transportation;Help with stairs or ramp for entrance;Supervision due to cognitive status;A lot of help with walking and/or transfers;A lot of help with bathing/dressing/bathroom   Can travel by private vehicle   No    Equipment Recommendations Rolling walker (2 wheels);BSC/3in1;Wheelchair (measurements PT);Wheelchair cushion (measurements PT)  Recommendations for Other Services  Rehab consult    Functional Status Assessment Patient has had a recent decline in their  functional status and demonstrates the ability to make significant improvements in function in a reasonable and predictable amount of time.     Precautions / Restrictions Precautions Precautions: Fall Precaution/Restrictions Comments: urostomy, abd wound vac, bil perc nephrostomy drains Restrictions Weight Bearing Restrictions Per Provider Order: No      Mobility  Bed Mobility Overal bed mobility: Needs Assistance Bed Mobility: Supine to Sit    Supine to sit: Mod assist, HOB elevated, Used rails    General bed mobility comments: increased time, assist to for hips to EOB and to raise trunk    Transfers Overall transfer level: Needs assistance Equipment used: Rolling walker (2 wheels) Transfers: Sit to/from Stand, Bed to chair/wheelchair/BSC Sit to Stand: Min assist, +2 safety/equipment   Step pivot transfers: Min assist, +2 safety/equipment      General transfer comment: assist to rise and steady, pt immediately requesting to step to chair, appeared fatigued after transfer      Balance Overall balance assessment: Needs assistance Sitting-balance support: Feet supported Sitting balance-Leahy Scale: Fair     Standing balance support: Reliant on assistive device for balance, Bilateral upper extremity supported Standing balance-Leahy Scale: Poor Standing balance comment: reliant on RW and up to minA for balance       Pertinent Vitals/Pain Pain Assessment Pain Assessment: Faces Faces Pain Scale: Hurts little more Pain Location: abdomen, generalized Pain Descriptors / Indicators: Discomfort, Grimacing, Guarding Pain Intervention(s): Limited activity within patient's tolerance, Monitored during session, Repositioned    Home Living Family/patient expects to be discharged to:: Private residence Living Arrangements: Spouse/significant other Available Help at Discharge: Family;Available 24 hours/day Type of Home: House Home Access: Stairs to enter   [employer] of Steps: 1   Home Layout: One level Home  Equipment: Rollator (4 wheels)      Prior Function Prior Level of Function : Independent/Modified Independent (prior to admission in May 2025)      Mobility Comments: pt has mobilized minimally since discharge to SNF ADLs Comments: assisted for ADLs since recent admission     Extremity/Trunk Assessment   Upper Extremity Assessment Upper Extremity Assessment: Defer to OT evaluation    Lower Extremity Assessment Lower Extremity Assessment: Generalized weakness    Cervical / Trunk Assessment Cervical / Trunk Assessment: Kyphotic;Other exceptions (weakness)  Communication   Communication Communication: Impaired Factors Affecting Communication: Hearing impaired    Cognition Arousal: Alert Behavior During Therapy: Flat affect, Anxious   PT - Cognitive impairments: Safety/Judgement, Initiation    Following commands: Intact Following commands impaired: Follows one step commands with increased time     Cueing Cueing Techniques: Verbal cues     General Comments General comments (skin integrity, edema, etc.): Wife present and supportive    Exercises     Assessment/Plan    PT Assessment Patient needs continued PT services  PT Problem List Decreased strength;Decreased range of motion;Decreased activity tolerance;Decreased balance;Decreased mobility;Decreased coordination;Decreased cognition;Cardiopulmonary status limiting activity       PT Treatment Interventions DME instruction;Gait training;Stair training;Functional mobility training;Therapeutic activities;Therapeutic exercise;Balance training;Patient/family education    PT Goals (Current goals can be found in the Care Plan section)  Acute Rehab PT Goals Patient Stated Goal: to work with therapy and get stronger PT Goal Formulation: With patient Time For Goal Achievement: 06/10/24 Potential to Achieve Goals: Good    Frequency Min 2X/week      Co-evaluation   Reason for Co-Treatment: For patient/therapist safety PT goals addressed during session: Mobility/safety with mobility;Balance OT goals addressed during session: ADL's and self-care       AM-PAC PT 6 Clicks Mobility  Outcome Measure Help needed turning from your back to your side while in a flat bed without using bedrails?: A Little Help needed moving from lying on your back to sitting on the side of a flat bed without using bedrails?: A Lot Help needed moving to and from a bed to a chair (including a wheelchair)?: A Little Help needed standing up from a chair using your arms (e.g., wheelchair or bedside chair)?: A Little Help needed to walk in hospital room?: Total Help needed climbing 3-5 steps with a railing? : Total 6 Click Score: 13    End of Session Equipment Utilized During Treatment: Gait belt Activity Tolerance: Patient tolerated treatment well Patient left: in chair;with call bell/phone within reach;with chair alarm set;with family/visitor present Nurse Communication: Mobility status PT Visit Diagnosis: Unsteadiness on feet (R26.81);Other abnormalities of gait and mobility (R26.89);Muscle weakness (generalized) (M62.81);Difficulty in walking, not elsewhere classified (R26.2)    Time: 9164-9143 PT Time Calculation (min) (ACUTE ONLY): 21 min   Charges:   PT Evaluation $PT Eval Low Complexity: 1 Low   PT General Charges $$ ACUTE PT VISIT: 1 Visit       Kate ORN, PT, DPT Secure Chat Preferred  Rehab Office [phone]   Kate BRAVO Wendolyn 05/27/2024, 10:35 AM

## 2024-05-27 NOTE — Progress Notes (Signed)
 Pt arrived to 4W23, alox4, hypotensive PA, made aware started on continuous IV fluids, and stable.

## 2024-05-27 NOTE — PMR Pre-admission (Signed)
 PMR Admission Coordinator Pre-Admission Assessment  Patient: Kristopher Thompson is an 73 y.o., male MRN: [id number] DOB: [birth year] Height: 5' 11 (180.3 cm) Weight: 102.4 kg  Insurance Information HMO:  yes   PPO:      PCP:      IPA:      80/20:      OTHER:  PRIMARY: Medicare A and B     [id number]    Subscriber: pt Benefits:  Phone #: passport one source     Name: 6/29 Eff. Date: 12/13/02     Deduct: $1676      Out of Pocket Max: none      Life Max: none CIR: 100%      SNF: 20 dull days Outpatient: 80%     Co-Pay: 20% Home Health: 100%      Co-Pay:  DME: 80%     Co-Pay: 20% Providers: pt choice  SECONDARY: AARP      Policy#: [id number]      Phone#:   Financial Counselor:       Phone#:   The "Data Collection Information Summary" for patients in Inpatient Rehabilitation Facilities with attached "Privacy Act Statement-Health Care Records" was provided and verbally reviewed with: Patient  Emergency Contact Information Contact Information     Name Relation Home Work Mobile   Thompson,Kristopher Spouse [phone]  [phone]      Other Contacts   None on File     Current Medical History  Patient Admitting Diagnosis: Debility History of Present Illness: Kristopher Thompson is a 73 year old male with PMHx significant of HTN, chronic diastolic congestive heart failure, CAD, DM2, pulmonary embolism (on Eliquis ), anemia, obesity, prostate CA and bladder CA.  Patient was initially admitted by general surgery on 04/10/2024 for operative management regarding parastomal and incisional incarcerated abdominal wall hernias which required extensive lysis, mesh insertion, repair, urostomy ileal conduit revision and was admitted to ICU due to tachycardia and tachypnea.  Hospital was course complicated by perforated bowel and postop shock as well as AKI requiring HD.  He was stabilized and discharged to SNF on 7/11 but returned at the request of SNF MD  due to increased output of ostomy therefore he  was directly admitted to [facility] on 05/25/24.  General surgery consulted, no surgical intervention recommended. t appears that the patient's serum creatinine recently was around 2 at the time of the discharge.On presentation serum creatinine was 2.3 suggesting possible AKI.Treated with IV fluid.  Creatinine improved to 1.9. Pt. With possible infection of ileostomy site on CT scan.  Started on IV Zosyn  however evaluated by general surgery/Dr. Sheldon and per them, they do not think patient has any signs of infection and did not recommend antibiotics. He was seen by PT/OT and they recommended CIR to assist return to PLOF.     Patient's medical record from [facility] has been reviewed by the rehabilitation admission coordinator and physician.  Past Medical History  Past Medical History:  Diagnosis Date   At risk for sleep apnea    12-25-2017   STOP-BANG SCORE= 5   --- SENT TO PCP   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   moderate atypia - right low back   Atypical nevus 04/04/2007   moderate to marked - right upper back (wider shave)   Atypical nevus 04/04/2007   moderate atypia - center chest (wider shave)   Atypical nevus 04/04/2007   slight atypia - right thigh   Atypical nevus 11/29/2011  mild atypia - center upper back   Atypical nevus 11/29/2011   mild atypia - center chest   Bacteremia due to Klebsiella pneumoniae 10/09/2017   Bladder cancer (HCC) dx 07/2017   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  s/p  cystectomy w/ ileal conduit urinary diversion   Candida infection    CHF (congestive heart failure) (HCC)    Colostomy in place [facility])    since 08-08-2017-- per pt 12-25-2017 reddness around stoma   Diabetes mellitus without complication (HCC)    GERD (gastroesophageal reflux disease)    H/O hiatal hernia    History of sepsis 09/2017   dx bacteremia due to klebsiella pneumoniae,  post op intraabdominal abscess   Prostate cancer [facility]) urologist-- dr  renda   10-02-2012 s/p  prostatectomy-- Stage T1c   RBBB    Renal disorder    pt. denies   Sleep apnea    cpap   Squamous cell carcinoma of skin 05/22/2013   left cheek - CX3 + 5FU   Wears glasses     Has the patient had major surgery during 100 days prior to admission? Yes  Family History   family history includes Colon cancer in an other family member; Hypertension in his father; Lung cancer in his mother.  Current Medications  Current Facility-Administered Medications:    (feeding supplement) PROSource Plus liquid 30 mL, 30 mL, Oral, BID BM, Patel, Pranav M, MD, 30 mL at 05/27/24 0928   0.9 %  sodium chloride  infusion, , Intravenous, Continuous, Tobie Yetta HERO, MD, Last Rate: 125 mL/hr at 05/26/24 2135, Infusion Verify at 05/26/24 2135   acetaminophen  (TYLENOL ) tablet 500 mg, 500 mg, Oral, TID WC & HS, [name], MD, 500 mg at 05/27/24 1245   albuterol  (PROVENTIL ) (2.5 MG/3ML) 0.083% nebulizer solution 2.5 mg, 2.5 mg, Nebulization, Q2H PRN, Debby Camila LABOR, MD   apixaban  (ELIQUIS ) tablet 5 mg, 5 mg, Oral, BID, Patel, Pranav M, MD, 5 mg at 05/27/24 9071   feeding supplement (ENSURE PLUS HIGH PROTEIN) liquid 237 mL, 237 mL, Oral, BID BM, Patel, Pranav M, MD, 237 mL at 05/26/24 1342   hydrOXYzine  (ATARAX ) tablet 25 mg, 25 mg, Oral, TID PRN, Debby Camila LABOR, MD   insulin  aspart (novoLOG ) injection 0-5 Units, 0-5 Units, Subcutaneous, QHS, Debby Camila LABOR, MD   insulin  aspart (novoLOG ) injection 0-6 Units, 0-6 Units, Subcutaneous, TID WC, Debby Camila A, MD   lactated ringers  bolus 1,000 mL, 1,000 mL, Intravenous, Q8H PRN, [name], MD   loperamide  (IMODIUM ) capsule 2-4 mg, 2-4 mg, Oral, Q6H PRN, [name], MD   loperamide  (IMODIUM ) capsule 4 mg, 4 mg, Oral, BID, [name], MD, 4 mg at 05/27/24 9071   magic mouthwash, 15 mL, Oral, QID PRN, [name], MD   menthol -cetylpyridinium (CEPACOL) lozenge 3 mg, 1 lozenge, Oral, PRN, [name], MD    metoCLOPramide  (REGLAN ) tablet 5-10 mg, 5-10 mg, Oral, Q8H PRN, [name], MD   midodrine  (PROAMATINE ) tablet 5 mg, 5 mg, Oral, TID WC, Patel, Pranav M, MD, 5 mg at 05/27/24 1245   morphine  (PF) 2 MG/ML injection 2 mg, 2 mg, Intravenous, Once, Debby Camila LABOR, MD   naphazoline-glycerin  (CLEAR EYES REDNESS) ophth solution 1-2 drop, 1-2 drop, Both Eyes, QID PRN, [name], MD   nutrition supplement (JUVEN) (JUVEN) powder packet 1 packet, 1 packet, Oral, BID BM, Patel, Pranav M, MD, 1 packet at 05/27/24 9071   ondansetron  (ZOFRAN ) injection 4 mg, 4 mg, Intravenous, Q6H PRN, Howerter, Justin B, DO,  4 mg at 05/27/24 0617   oxyCODONE -acetaminophen  (PERCOCET/ROXICET) 5-325 MG per tablet 1-2 tablet, 1-2 tablet, Oral, Q6H PRN, [name], MD   phenol (CHLORASEPTIC) mouth spray 2 spray, 2 spray, Mouth/Throat, PRN, [name], MD   piperacillin -tazobactam (ZOSYN ) IVPB 3.375 g, 3.375 g, Intravenous, Q8H, Hammons, Kimberly B, RPH, Last Rate: 12.5 mL/hr at 05/27/24 0513, 3.375 g at 05/27/24 0513   polycarbophil (FIBERCON) tablet 625 mg, 625 mg, Oral, BID, Debby Hitch A, MD, 625 mg at 05/27/24 9071   rosuvastatin  (CRESTOR ) tablet 10 mg, 10 mg, Oral, QPM, Debby Hitch A, MD, 10 mg at 05/26/24 1747   sodium chloride  (OCEAN) 0.65 % nasal spray 1-2 spray, 1-2 spray, Each Nare, Q6H PRN, [name], MD   sodium chloride  flush (NS) 0.9 % injection 3 mL, 3 mL, Intravenous, Q12H, Debby Hitch LABOR, MD, 3 mL at 05/27/24 0936  Patients Current Diet:  Diet Order             Diet heart healthy/carb modified Room service appropriate? Yes; Fluid consistency: Thin  Diet effective now                   Precautions / Restrictions Precautions Precautions: Fall Precaution/Restrictions Comments: urostomy, abd wound vac, bil perc nephrostomy drains Restrictions Weight Bearing Restrictions Per Provider Order: No   Has the patient had 2 or more falls or a fall with injury in the past  year? Yes  Prior Activity Level Community (5-7x/wk): Pt. active in the community PTA  Prior Functional Level Self Care: Did the patient need help bathing, dressing, using the toilet or eating? Independent  Indoor Mobility: Did the patient need assistance with walking from room to room (with or without device)? Independent  Stairs: Did the patient need assistance with internal or external stairs (with or without device)? Independent  Functional Cognition: Did the patient need help planning regular tasks such as shopping or remembering to take medications? Independent  Patient Information Are you of Hispanic, Latino/a,or Spanish origin?: A. No, not of Hispanic, Latino/a, or Spanish origin What is your race?: A. White Do you need or want an interpreter to communicate with a doctor or health care staff?: 0. No  Patient's Response To:  Health Literacy and Transportation Is the patient able to respond to health literacy and transportation needs?: Yes Health Literacy - How often do you need to have someone help you when you read instructions, pamphlets, or other written material from your doctor or pharmacy?: Never In the past 12 months, has lack of transportation kept you from medical appointments or from getting medications?: No In the past 12 months, has lack of transportation kept you from meetings, work, or from getting things needed for daily living?: No  Home Assistive Devices / Equipment Home Equipment: Rollator (4 wheels)  Prior Device Use: Indicate devices/aids used by the patient prior to current illness, exacerbation or injury? None of the above  Current Functional Level Cognition  Orientation Level: Oriented X4    Extremity Assessment (includes Sensation/Coordination)  Upper Extremity Assessment: Defer to OT evaluation  Lower Extremity Assessment: Generalized weakness    ADLs  Overall ADL's : Needs assistance/impaired Eating/Feeding: Set up, Sitting Grooming: Set up,  Sitting Upper Body Bathing: Minimal assistance, Sitting Lower Body Bathing: Total assistance, Sit to/from stand, +2 for safety/equipment Upper Body Dressing : Minimal assistance, Sitting Lower Body Dressing: Total assistance, Sit to/from stand, +2 for safety/equipment Toilet Transfer: Minimal assistance, +2 for safety/equipment, Rolling walker (2 wheels) Toileting- Clothing Manipulation and  Hygiene: Total assistance, +2 for safety/equipment, Sit to/from stand    Mobility  Overal bed mobility: Needs Assistance Bed Mobility: Supine to Sit Supine to sit: Mod assist, HOB elevated, Used rails General bed mobility comments: increased time, assist to for hips to EOB and to raise trunk    Transfers  Overall transfer level: Needs assistance Equipment used: Rolling walker (2 wheels) Transfers: Sit to/from Stand, Bed to chair/wheelchair/BSC Sit to Stand: Min assist, +2 safety/equipment Bed to/from chair/wheelchair/BSC transfer type:: Step pivot Step pivot transfers: Min assist, +2 safety/equipment General transfer comment: assist to rise and steady, pt immediately requesting to step to chair, appeared fatigued after transfer    Ambulation / Gait / Stairs / Engineer, drilling / Balance Balance Overall balance assessment: Needs assistance Sitting-balance support: Feet supported Sitting balance-Leahy Scale: Fair Standing balance support: Reliant on assistive device for balance, Bilateral upper extremity supported Standing balance-Leahy Scale: Poor Standing balance comment: reliant on RW and up to minA for balance    Special needs/care consideration Special service needs ostomy, wound vac, nephrostomy tube   Previous Home Environment (from acute therapy documentation) Living Arrangements: Spouse/significant other Available Help at Discharge: Family, Available 24 hours/day Type of Home: House Home Layout: One level Home Access: Stairs to enter [employer] of Steps:  1 Bathroom Shower/Tub: Engineer, manufacturing systems: Standard Home Care Services: No  Discharge Living Setting Plans for Discharge Living Setting: Patient's home Type of Home at Discharge: House Discharge Home Layout: One level Discharge Home Access: Stairs to enter Entrance Stairs-Rails: None Entrance Stairs-Number of Steps: 1 Discharge Bathroom Shower/Tub: Tub/shower unit Discharge Bathroom Toilet: Standard Discharge Bathroom Accessibility: No Does the patient have any problems obtaining your medications?: No  Social/Family/Support Systems Patient Roles: Spouse Contact Information: [phone] Anticipated Caregiver: [place] Ability/Limitations of Caregiver: Wife Min A Caregiver Availability: 24/7 Discharge Plan Discussed with Primary Caregiver: Yes Is Caregiver In Agreement with Plan?: Yes Does Caregiver/Family have Issues with Lodging/Transportation while Pt is in Rehab?: Yes  Goals Patient/Family Goal for Rehab: PT/OT Min A Expected length of stay: 12-14 days Pt/Family Agrees to Admission and willing to participate: Yes Program Orientation Provided & Reviewed with Pt/Caregiver Including Roles  & Responsibilities: Yes  Decrease burden of Care through IP rehab admission: not anticipated  Possible need for SNF placement upon discharge: not anticipated  Patient Condition: I have reviewed medical records from [facility] , spoken with CM, and patient. I met with patient at the bedside for inpatient rehabilitation assessment.  Patient will benefit from ongoing PT and OT, can actively participate in 3 hours of therapy a day 5 days of the week, and can make measurable gains during the admission.  Patient will also benefit from the coordinated team approach during an Inpatient Acute Rehabilitation admission.  The patient will receive intensive therapy as well as Rehabilitation physician, nursing, social worker, and care management interventions.  Due to  safety, skin/wound care, disease management, medication administration, pain management, and patient education the patient requires 24 hour a day rehabilitation nursing.  The patient is currently min A- max A  with mobility and basic ADLs.  Discharge setting and therapy post discharge at home with home health is anticipated.  Patient has agreed to participate in the Acute Inpatient Rehabilitation Program and will admit today.  Preadmission Screen Completed By:  [name] KATHEE Kleine, 05/27/2024 12:56 PM ______________________________________________________________________   Discussed status with Dr. Lorilee on 05/27/24 at 900 and received approval for  admission today.  Admission Coordinator:  [name] KATHEE Kleine, CCC-SLP, time 1300/Date 05/27/24   Assessment/Plan: Diagnosis: Debility Does the need for close, 24 hr/day Medical supervision in concert with the patient's rehab needs make it unreasonable for this patient to be served in a less intensive setting? Yes Co-Morbidities requiring supervision/potential complications:  AKI on CKD 3b Type 2 DM HLD Obesity S/p ileal conduit Due to bladder management, bowel management, safety, skin/wound care, disease management, medication administration, pain management, and patient education, does the patient require 24 hr/day rehab nursing? Yes Does the patient require coordinated care of a physician, rehab nurse, PT, OT to address physical and functional deficits in the context of the above medical diagnosis(es)? Yes Addressing deficits in the following areas: balance, endurance, locomotion, strength, transferring, bowel/bladder control, bathing, dressing, feeding, grooming, toileting, and psychosocial support Can the patient actively participate in an intensive therapy program of at least 3 hrs of therapy 5 days a week? Yes The potential for patient to make measurable gains while on inpatient rehab is excellent Anticipated functional outcomes upon discharge from  inpatient rehab: modified independent PT, modified independent OT, independent SLP Estimated rehab length of stay to reach the above functional goals is: 10-14 days Anticipated discharge destination: Home 10. Overall Rehab/Functional Prognosis: excellent   MD Signature: Sven Elks, MD

## 2024-05-27 NOTE — H&P (Signed)
 Physical Medicine and Rehabilitation Admission H&P    CC: Debility 2/2 perforated bowel : HPI: Kristopher Thompson is a 73 year old male with PMHx significant of HTN, chronic diastolic congestive heart failure, CAD, DM2, pulmonary embolism (on Eliquis ), anemia, obesity, prostate CA and bladder CA.  Patient was initially admitted by general surgery on 04/10/2024 for operative management regarding parastomal and incisional incarcerated abdominal wall hernias which required extensive lysis, mesh insertion, repair, urostomy ileal conduit revision and was admitted to ICU due to tachycardia and tachypnea.  Hospital was course complicated by perforated bowel and postop shock as well as AKI requiring HD.  He was stabilized and discharged to SNF on 7/11 but returned at the request due to increased output of ostomy therefore he was directly admitted.  Urology consulted for urostomy evaluation and continue to follow. General surgery consulted and recommending continuing wound VAC and bowel regimen for ileostomy.  Admission labs creatinine 2.3. DVT prophylaxis Eliquis . Currently requiring min assist +2 safety/equipment for transfers and needs assistance for overall bed mobility.  Per chart review patient lives in a 1 level home with spouse. Therapy evaluations completed due to patient decreased functional mobility was admitted for a comprehensive rehab program. He complains of pain at ostomy site.   ROS +pain at ostomy site Past Medical History:  Diagnosis Date   At risk for sleep apnea    12-25-2017   STOP-BANG SCORE= 5   --- SENT TO PCP   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   moderate atypia - right low back   Atypical nevus 04/04/2007   moderate to marked - right upper back (wider shave)   Atypical nevus 04/04/2007   moderate atypia - center chest (wider shave)   Atypical nevus 04/04/2007   slight atypia - right thigh   Atypical nevus 11/29/2011   mild atypia - center upper back   Atypical nevus 11/29/2011    mild atypia - center chest   Bacteremia due to Klebsiella pneumoniae 10/09/2017   Bladder cancer (HCC) dx 07/2017   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 denies   Sleep apnea    cpap   Squamous cell carcinoma of skin 05/22/2013   left cheek - CX3 + 5FU   Wears glasses    Past Surgical History:  Procedure Laterality Date   ABDOMINAL SURGERY     APPENDECTOMY  1972   BOWEL RESECTION N/A 04/20/2024   Procedure: SMALL BOWEL RESECTION;  Surgeon: [name], MD;  Location: [facility];  Service: General;  Laterality: N/A;   CHOLECYSTECTOMY  1985   COLONOSCOPY N/A 06/29/2021   Procedure: COLONOSCOPY;  Surgeon: Debby Hila, MD;  [facility];  Service: Endoscopy;  Laterality: N/A;   COLOSTOMY REVERSAL N/A 01/08/2018   Procedure: COLOSTOMY REVERSAL;  Surgeon: Debby Hila, MD;  [facility];  Service: General;  Laterality: N/A;   CYSTOSCOPY WITH RETROGRADE PYELOGRAM,  URETEROSCOPY AND STENT PLACEMENT Right 06/10/2017   Procedure: CYSTOSCOPY WITH RIGHT URETEROSCOPY WITH RIGHT STENT PLACEMENT;  Surgeon: [name], MD;  [facility];  Service: Urology;  Laterality: Right;   EUS N/A 04/16/2018   Procedure: FULL UPPER ENDOSCOPIC ULTRASOUND (EUS) RADIAL;  Surgeon: Burnette Fallow, MD;  [facility];  Service: Endoscopy;  Laterality: N/A;   FLEXIBLE SIGMOIDOSCOPY N/A 12/13/2017   Procedure: FLEXIBLE SIGMOIDOSCOPY;  Surgeon: Debby Hila, MD;  [facility];  Service: Endoscopy;  Laterality: N/A;   ILEO CONDUIT     IR CATHETER TUBE CHANGE  12/13/2017   IR CATHETER TUBE CHANGE  01/17/2018   IR CATHETER TUBE CHANGE  02/28/2018   IR CATHETER TUBE CHANGE  07/18/2018   IR CATHETER TUBE CHANGE  08/22/2018   IR CATHETER TUBE CHANGE  10/31/2018   IR CONVERT LEFT NEPHROSTOMY TO NEPHROURETERAL CATH  10/24/2017   IR EXT NEPHROURETERAL CATH EXCHANGE  05/19/2018   IR EXT NEPHROURETERAL CATH EXCHANGE  06/16/2018   IR NEPHRO TUBE REMOV/FL  10/24/2017   IR NEPHROSTOGRAM LEFT THRU EXISTING ACCESS  12/05/2018   IR NEPHROSTOMY EXCHANGE LEFT  05/11/2024   IR NEPHROSTOMY PLACEMENT LEFT  10/07/2017   IR NEPHROSTOMY PLACEMENT RIGHT  05/04/2024   IR NEPHROSTOMY TUBE CHANGE  04/11/2018   IR URETERAL STENT PLACEMENT EXISTING ACCESS LEFT  05/13/2024   IR URETERAL STENT RIGHT NEW ACCESS W/SEP NEPHROSTOMY CATH  05/13/2024   LAPAROTOMY N/A 04/20/2024   Procedure: EXPLORATORY LAPAROTOMY;  Surgeon: [name], MD;  [facility];  Service: General;  Laterality: N/A;   PARTIAL COLECTOMY N/A 04/21/2024   Procedure: COLECTOMY, PARTIAL;  Surgeon: [name], MD;  [facility];  Service: General;  Laterality: N/A;  Removal Wound Vac, Washout Ostomy, Possible Anastomosis, Possible Ileostomy.  Phasix Mesh.   POLYPECTOMY  06/29/2021   Procedure: POLYPECTOMY;  Surgeon: Debby Hila, MD;  [facility];  Service: Endoscopy;;   ROBOT ASSISTED LAPAROSCOPIC RADICAL PROSTATECTOMY  10/02/2012   Procedure: ROBOTIC ASSISTED LAPAROSCOPIC RADICAL PROSTATECTOMY LEVEL 2;  Surgeon: Noretta Renda, MD;  [facility];  Service: Urology;  Laterality: N/A;   ROBOTIC ASSISTED LAPAROSCOPIC BLADDER DIVERTICULECTOMY N/A 08/08/2017   Procedure: XI ROBOTIC ASSISTED LAPAROSCOPIC RADICAL CYSTECTOMY COVERTED TO OPEN PELVIC LYMPHADNECTOMY BILATERAL AND ILEAL CONDUIT URINARY DIVERSION;  Surgeon: [name], MD;  [facility];  Service: Urology;  Laterality: N/A;    TRANSURETHRAL RESECTION OF BLADDER TUMOR  06/10/2017   Procedure: TRANSURETHRAL RESECTION OF BLADDER TUMOR (TURBT);  Surgeon: [name], MD;  [facility];  Service: Urology;;   VACUUM ASSISTED CLOSURE CHANGE N/A 04/20/2024   Procedure: PLACEMENT OF CONCETTA CAPES;  Surgeon: [name], MD;  [facility];  Service: General;  Laterality: N/A;   VENTRAL HERNIA REPAIR N/A 04/10/2024   Procedure: REPAIR, HERNIA, VENTRAL;  Surgeon: [name], MD;  [facility];  Service: General;  Laterality: N/A;   XI ROBOT ABDOMINAL PERINEAL RESECTION N/A 08/08/2017   Procedure: REPAIR OF RECTAL TEAR POSSIBLE PARTIAL PROCTECTOMY, CREATION OF  OSTOMY;  Surgeon: Debby Hila, MD;  [facility];  Service: General;  Laterality: N/A;   XI ROBOTIC ASSISTED PARASTOMAL HERNIA REPAIR N/A 04/10/2024   Procedure: REPAIR, HERNIA, PARASTOMAL AND VENTRAL HERNIAS, ROBOT-ASSISTED, LEFT INGUINAL HERNIA, LYSIS OF ADHESIONS INCARCERATED AND INSIONAL HERNIAS AND UROSTOMY REVISION;  Surgeon: [name], MD;  [facility];  Service: General;  Laterality: N/A;   Family History  Problem Relation Age of Onset   Lung cancer Mother  Hypertension Father    Colon cancer Other    CAD Neg Hx    Diabetes Neg Hx    Stroke Neg Hx    Social History:  reports that he quit smoking about 44 years ago. His smoking use included cigarettes. He started smoking about 53 years ago. He has never used smokeless tobacco. He reports current alcohol  use. He reports that he does not use drugs. Allergies:  Allergies  Allergen Reactions   Demerol  [Meperidine ] Nausea And Vomiting   Medications Prior to Admission  Medication Sig Dispense Refill   acetaminophen  (TYLENOL ) 500 MG tablet Take 1,000 mg by mouth every 8 (eight) hours as needed for mild pain or headache.      Amino Acids -Protein Hydrolys (FEEDING SUPPLEMENT, PRO-STAT SUGAR FREE 64,) LIQD Take 90 mLs by mouth daily. (Patient not taking: Reported on 05/26/2024)      atorvastatin (LIPITOR) 20 MG tablet Take 20 mg by mouth at bedtime.     bisacodyl  (DULCOLAX) 5 MG EC tablet Take 2 tablets (10 mg total) by mouth daily as needed for moderate constipation. (Patient taking differently: Take 10 mg by mouth daily as needed (constipation).) 20 tablet 0   cyclobenzaprine  (FLEXERIL ) 5 MG tablet Take 1 tablet (5 mg total) by mouth 3 (three) times daily as needed for muscle spasms. 20 tablet 1   diphenhydrAMINE  (BENADRYL ) 25 mg capsule Take 25 mg by mouth daily as needed for allergies (congestion).     ELIQUIS  5 MG TABS tablet Take 5 mg by mouth 2 (two) times daily.     feeding supplement (ENSURE PLUS HIGH PROTEIN) LIQD Take 237 mLs by mouth 2 (two) times daily between meals. (Patient not taking: Reported on 05/26/2024)     hydrOXYzine  (ATARAX ) 25 MG tablet Take 1 tablet (25 mg total) by mouth 3 (three) times daily as needed for anxiety.     ipratropium (ATROVENT ) 0.03 % nasal spray Place 1 spray into both nostrils every 8 (eight) hours as needed (allergies).     loperamide  (IMODIUM ) 2 MG capsule Take 2 capsules (4 mg total) by mouth 2 (two) times daily. 30 capsule 0   metFORMIN (GLUCOPHAGE) 500 MG tablet Take 500 mg by mouth daily.     metoCLOPramide  (REGLAN ) 5 MG tablet Take 1 tablet (5 mg total) by mouth every 8 (eight) hours as needed for nausea. 30 tablet 2   midodrine  (PROAMATINE ) 5 MG tablet Take 1 tablet (5 mg total) by mouth 3 (three) times daily with meals. 90 tablet 0   Multiple Vitamin (MULTIVITAMIN PO) Take 1 tablet by mouth daily.     nutrition supplement, JUVEN, (JUVEN) PACK Take 1 packet by mouth 2 (two) times daily between meals. (Patient not taking: Reported on 05/26/2024)     Nutritional Supplements (,FEEDING SUPPLEMENT, PROSOURCE PLUS) liquid Take 30 mLs by mouth 2 (two) times daily between meals. (Patient not taking: Reported on 05/26/2024)     Nutritional Supplements (NUTRITIONAL DRINK) LIQD Take 60 mLs by mouth 2 (two) times daily. house supplement      Omega-3 Fatty Acids (FISH OIL) 1000 MG CAPS Take 1,000 mg by mouth every Monday, Wednesday, and Friday.     ondansetron  (ZOFRAN ) 4 MG tablet Take 1 tablet (4 mg total) by mouth daily as needed for nausea or vomiting. 30 tablet 1   oxyCODONE -acetaminophen  (PERCOCET) 5-325 MG tablet Take 1 tablet by mouth every 6 (six) hours as needed for severe pain (pain score 7-10). Can increase to 2 pills at a time if needed  20 tablet 0   polycarbophil (FIBERCON) 625 MG tablet Take 1 tablet (625 mg total) by mouth 2 (two) times daily. 60 tablet 2   polyethylene glycol powder (GLYCOLAX /MIRALAX ) 17 GM/SCOOP powder Take 17 g by mouth daily as needed (constipation).     sodium chloride  (OCEAN) 0.65 % SOLN nasal spray Place 1-2 sprays into both nostrils every 6 (six) hours as needed for congestion (dry nose). (Patient not taking: Reported on 05/26/2024)     vitamin B-12 (CYANOCOBALAMIN ) 500 MCG tablet Take 500 mcg by mouth every Monday, Wednesday, and Friday.     ZINC OXIDE EX Apply 1 application  topically See admin instructions. Cleanse sacrum with wound cleanser; pat dry; apply zinc oxide ointment and leave open to air. Every day shift for wound care.     Home: Home Living Family/patient expects to be discharged to:: Private residence Living Arrangements: Spouse/significant other Available Help at Discharge: Family, Available 24 hours/day Type of Home: House Home Access: Stairs to enter Secretary/administrator of Steps: 1 Home Layout: One level Bathroom Shower/Tub: Engineer, manufacturing systems: Standard Home Equipment: Rollator (4 wheels)   Functional History: Prior Function Prior Level of Function : Independent/Modified Independent (prior to admission in May 2025) Mobility Comments: pt has mobilized minimally since discharge to SNF ADLs Comments: assisted for ADLs since recent admission   Functional Status:  Mobility: Bed Mobility Overal bed mobility: Needs Assistance Bed Mobility: Supine to  Sit Supine to sit: Mod assist, HOB elevated, Used rails General bed mobility comments: increased time, assist to for hips to EOB and to raise trunk Transfers Overall transfer level: Needs assistance Equipment used: Rolling walker (2 wheels) Transfers: Sit to/from Stand, Bed to chair/wheelchair/BSC Sit to Stand: Min assist, +2 safety/equipment Bed to/from chair/wheelchair/BSC transfer type:: Step pivot Step pivot transfers: Min assist, +2 safety/equipment General transfer comment: assist to rise and steady, pt immediately requesting to step to chair, appeared fatigued after transfer   ADL: ADL Overall ADL's : Needs assistance/impaired Eating/Feeding: Set up, Sitting Grooming: Set up, Sitting Upper Body Bathing: Minimal assistance, Sitting Lower Body Bathing: Total assistance, Sit to/from stand, +2 for safety/equipment Upper Body Dressing : Minimal assistance, Sitting Lower Body Dressing: Total assistance, Sit to/from stand, +2 for safety/equipment Toilet Transfer: Minimal assistance, +2 for safety/equipment, Rolling walker (2 wheels) Toileting- Clothing Manipulation and Hygiene: Total assistance, +2 for safety/equipment, Sit to/from stand   Cognition: Cognition Orientation Level: Oriented X4 Cognition Arousal: Alert Behavior During Therapy: Flat affect, Anxious   Physical Exam: Blood pressure (!) 87/58, pulse 81, temperature 98.5 F (36.9 C), temperature source Oral, resp. rate 20, height 5' 11 (1.803 m), weight 92.5 kg, SpO2 100%. Physical Exam Gen: no distress, normal appearing HEENT: oral mucosa pink and moist, NCAT Cardio: Reg rate Chest: normal effort, normal rate of breathing Abd: soft, non-distended Ext: no edema Psych: pleasant, normal affect Skin: intact Neuro: Alert and oriented x3 MSK: 4/5 strength throughout  Results for orders placed or performed during the hospital encounter of 05/25/24 (from the past 48 hours)  Brain natriuretic peptide     Status: None    Collection Time: 05/25/24 11:38 PM  Result Value Ref Range   B Natriuretic Peptide 24.6 0.0 - 100.0 pg/mL    Comment: Performed at [facility], [address], [place], [place] [address]  Culture, blood (Routine X 2) w Reflex to ID Panel     Status: None (Preliminary result)   Collection Time: 05/26/24 12:28 AM   Specimen: BLOOD LEFT ARM  Result Value Ref Range   Specimen Description BLOOD LEFT ARM    Special Requests      BOTTLES DRAWN AEROBIC AND ANAEROBIC Blood Culture adequate volume   Culture      NO GROWTH 1 DAY Performed at [facility], [address], [place], [place] [address]    Report Status PENDING   Comprehensive metabolic panel     Status: Abnormal   Collection Time: 05/26/24 12:53 AM  Result Value Ref Range   Sodium 125 (L) 135 - 145 mmol/L   Potassium 4.5 3.5 - 5.1 mmol/L   Chloride 98 98 - 111 mmol/L   CO2 14 (L) 22 - 32 mmol/L   Glucose, Bld 114 (H) 70 - 99 mg/dL    Comment: Glucose reference range applies only to samples taken after fasting for at least 8 hours.   BUN 93 (H) 8 - 23 mg/dL   Creatinine, Ser 7.75 (H) 0.61 - 1.24 mg/dL   Calcium  9.8 8.9 - 10.3 mg/dL   Total Protein 6.9 6.5 - 8.1 g/dL   Albumin  3.1 (L) 3.5 - 5.0 g/dL   AST 42 (H) 15 - 41 U/L   ALT 81 (H) 0 - 44 U/L   Alkaline Phosphatase 96 38 - 126 U/L   Total Bilirubin 0.4 0.0 - 1.2 mg/dL   GFR, Estimated 31 (L) >60 mL/min    Comment: (NOTE) Calculated using the CKD-EPI Creatinine Equation (2021)    Anion gap 13 5 - 15    Comment: Performed at [facility], [address], [place], [place] [address]  Magnesium      Status: None   Collection Time: 05/26/24 12:53 AM  Result Value Ref Range   Magnesium  2.4 1.7 - 2.4 mg/dL    Comment: Performed at [facility], [address], [place], [place] [address]  Phosphorus     Status: Abnormal   Collection Time: 05/26/24 12:53 AM  Result Value Ref Range   Phosphorus 5.2 (H) 2.5 - 4.6 mg/dL    Comment: Performed at [facility], [address] [place], [place] [address]  CBC with Differential/Platelet     Status: Abnormal   Collection Time: 05/26/24 12:53 AM  Result Value Ref Range   WBC 11.9 (H) 4.0 - 10.5 K/uL   RBC 5.00 4.22 - 5.81 MIL/uL   Hemoglobin 14.9 13.0 - 17.0 g/dL   HCT 54.8 60.9 - 47.9 %   MCV 90.2 80.0 - 100.0 fL   MCH 29.8 26.0 - 34.0 pg   MCHC 33.0 30.0 - 36.0 g/dL   RDW 84.6 88.4 - 84.4 %   Platelets 259 150 - 400 K/uL   nRBC 0.0 0.0 - 0.2 %   Neutrophils Relative % 62 %   Neutro Abs 7.5 1.7 - 7.7 K/uL   Lymphocytes Relative 17 %   Lymphs Abs 2.0 0.7 - 4.0 K/uL   Monocytes Relative 13 %   Monocytes Absolute 1.6 (H) 0.1 - 1.0 K/uL   Eosinophils Relative 4 %   Eosinophils Absolute 0.5 0.0 - 0.5 K/uL   Basophils Relative 2 %   Basophils Absolute 0.2 (H) 0.0 - 0.1 K/uL   Immature Granulocytes 2 %   Abs Immature Granulocytes 0.19 (H) 0.00 - 0.07 K/uL    Comment: Performed at [facility], [address], [place], [place] [address]  Protime-INR     Status: Abnormal   Collection Time: 05/26/24 12:53 AM  Result Value Ref  Range   Prothrombin Time 16.3 (H) 11.4 - 15.2 seconds   INR 1.2 0.8 - 1.2    Comment: (NOTE) INR goal varies based on device and disease states. Performed at [facility], [address], [place], [place] [address]   TSH     Status: None   Collection Time: 05/26/24 12:53 AM  Result Value Ref Range   TSH 1.457 0.350 - 4.500 uIU/mL    Comment: Performed by a 3rd Generation assay with a functional sensitivity of <=0.01 uIU/mL. Performed at [facility], [address], [place], [place] [address]   Culture, blood (Routine X 2) w Reflex to ID Panel     Status: None (Preliminary result)   Collection Time: 05/26/24 12:53 AM   Specimen: BLOOD RIGHT HAND  Result Value Ref Range   Specimen Description BLOOD RIGHT HAND    Special Requests      BOTTLES DRAWN AEROBIC AND ANAEROBIC Blood Culture adequate volume   Culture      NO GROWTH 1  DAY Performed at [facility], [address], [place], [place] [address]    Report Status PENDING   C-reactive protein     Status: Abnormal   Collection Time: 05/26/24 12:53 AM  Result Value Ref Range   CRP 1.1 (H) <1.0 mg/dL    Comment: Performed at [facility], [address] [place], [place] [address]  Procalcitonin     Status: None   Collection Time: 05/26/24 12:53 AM  Result Value Ref Range   Procalcitonin <0.10 ng/mL    Comment:        Interpretation: PCT (Procalcitonin) <= 0.5 ng/mL: Systemic infection (sepsis) is not likely. Local bacterial infection is possible. (NOTE)       Sepsis PCT Algorithm           Lower Respiratory Tract                                      Infection PCT Algorithm    ----------------------------     ----------------------------         PCT < 0.25 ng/mL                PCT < 0.10 ng/mL          Strongly encourage             Strongly discourage   discontinuation of antibiotics    initiation of antibiotics    ----------------------------     -----------------------------       PCT 0.25 - 0.50 ng/mL            PCT 0.10 - 0.25 ng/mL               OR       >80% decrease in PCT            Discourage initiation of                                            antibiotics      Encourage discontinuation           of antibiotics    ----------------------------     -----------------------------         PCT >= 0.50 ng/mL  PCT 0.26 - 0.50 ng/mL               AND        <80% decrease in PCT             Encourage initiation of                                             antibiotics       Encourage continuation           of antibiotics    ----------------------------     -----------------------------        PCT >= 0.50 ng/mL                  PCT > 0.50 ng/mL               AND         increase in PCT                  Strongly encourage                                      initiation of antibiotics    Strongly encourage escalation            of antibiotics                                     -----------------------------                                           PCT <= 0.25 ng/mL                                                 OR                                        > 80% decrease in PCT                                      Discontinue / Do not initiate                                             antibiotics  Performed at [facility], [address], [place], [place] [address]   Glucose, capillary     Status: Abnormal   Collection Time: 05/26/24 12:55 AM  Result Value Ref Range   Glucose-Capillary 118 (H) 70 - 99 mg/dL    Comment: Glucose reference range applies only to samples taken after fasting for at least 8 hours.  Comprehensive metabolic panel     Status: Abnormal   Collection Time: 05/26/24  4:23  AM  Result Value Ref Range   Sodium 125 (L) 135 - 145 mmol/L   Potassium 4.4 3.5 - 5.1 mmol/L   Chloride 97 (L) 98 - 111 mmol/L   CO2 13 (L) 22 - 32 mmol/L   Glucose, Bld 111 (H) 70 - 99 mg/dL    Comment: Glucose reference range applies only to samples taken after fasting for at least 8 hours.   BUN 94 (H) 8 - 23 mg/dL   Creatinine, Ser 7.68 (H) 0.61 - 1.24 mg/dL   Calcium  9.6 8.9 - 10.3 mg/dL   Total Protein 6.7 6.5 - 8.1 g/dL   Albumin  3.3 (L) 3.5 - 5.0 g/dL   AST 34 15 - 41 U/L   ALT 72 (H) 0 - 44 U/L   Alkaline Phosphatase 89 38 - 126 U/L   Total Bilirubin 0.8 0.0 - 1.2 mg/dL   GFR, Estimated 29 (L) >60 mL/min    Comment: (NOTE) Calculated using the CKD-EPI Creatinine Equation (2021)    Anion gap 15 5 - 15    Comment: Performed at [facility], [address], [place], [place] [address]  CBC     Status: Abnormal   Collection Time: 05/26/24  4:23 AM  Result Value Ref Range   WBC 12.6 (H) 4.0 - 10.5 K/uL   RBC 4.52 4.22 - 5.81 MIL/uL   Hemoglobin 13.4 13.0 - 17.0 g/dL   HCT 60.7 60.9 - 47.9 %   MCV 86.7 80.0 - 100.0 fL   MCH 29.6 26.0 - 34.0 pg   MCHC 34.2 30.0 - 36.0 g/dL   RDW  84.7 88.4 - 84.4 %   Platelets 297 150 - 400 K/uL   nRBC 0.0 0.0 - 0.2 %    Comment: Performed at [facility], [address] [place], [place] [address]  Lactic acid, plasma     Status: None   Collection Time: 05/26/24  4:23 AM  Result Value Ref Range   Lactic Acid, Venous 1.5 0.5 - 1.9 mmol/L    Comment: Performed at [facility], [address], [place], [place] [address]  Blood gas, venous     Status: Abnormal   Collection Time: 05/26/24  4:23 AM  Result Value Ref Range   pH, Ven 7.33 7.25 - 7.43   pCO2, Ven 27 (L) 44 - 60 mmHg   pO2, Ven 60 (H) 32 - 45 mmHg   Bicarbonate 14.2 (L) 20.0 - 28.0 mmol/L   Acid-base deficit 10.1 (H) 0.0 - 2.0 mmol/L   O2 Saturation 90.5 %   Patient temperature 36.7    Collection site BLOOD LEFT HAND    Drawn by DRAWN BY RN     Comment: Performed at [facility], [address], [place], [place] [address]  Glucose, capillary     Status: Abnormal   Collection Time: 05/26/24  7:35 AM  Result Value Ref Range   Glucose-Capillary 116 (H) 70 - 99 mg/dL    Comment: Glucose reference range applies only to samples taken after fasting for at least 8 hours.  Lactic acid, plasma     Status: None   Collection Time: 05/26/24  8:19 AM  Result Value Ref Range   Lactic Acid, Venous 1.4 0.5 - 1.9 mmol/L    Comment: Performed at [facility], [address] [place], [place] [address]  Glucose, capillary     Status: Abnormal   Collection Time: 05/26/24 12:33 PM  Result Value  Ref Range   Glucose-Capillary 142 (H) 70 - 99 mg/dL    Comment: Glucose reference range applies only to samples taken after fasting for at least 8 hours.  Creatinine, fluid (pleural, peritoneal, JP Drainage)     Status: None   Collection Time: 05/26/24 12:44 PM  Result Value Ref Range   Creat, Fluid 2.1 mg/dL    Comment: (NOTE) No normal range established for this test Results should be evaluated in conjunction with serum values    Fluid Type-FCRE JP Drainage      Comment: Performed at [facility], [address], [place], [place] [address]  Glucose, capillary     Status: Abnormal   Collection Time: 05/26/24  4:33 PM  Result Value Ref Range   Glucose-Capillary 109 (H) 70 - 99 mg/dL    Comment: Glucose reference range applies only to samples taken after fasting for at least 8 hours.  Glucose, capillary     Status: Abnormal   Collection Time: 05/26/24  8:52 PM  Result Value Ref Range   Glucose-Capillary 118 (H) 70 - 99 mg/dL    Comment: Glucose reference range applies only to samples taken after fasting for at least 8 hours.  Magnesium      Status: None   Collection Time: 05/27/24  2:15 AM  Result Value Ref Range   Magnesium  2.2 1.7 - 2.4 mg/dL    Comment: Performed at [facility], [address] [place], [place] [address]  Comprehensive metabolic panel with GFR     Status: Abnormal   Collection Time: 05/27/24  2:15 AM  Result Value Ref Range   Sodium 130 (L) 135 - 145 mmol/L   Potassium 4.1 3.5 - 5.1 mmol/L   Chloride 101 98 - 111 mmol/L   CO2 16 (L) 22 - 32 mmol/L   Glucose, Bld 98 70 - 99 mg/dL    Comment: Glucose reference range applies only to samples taken after fasting for at least 8 hours.   BUN 82 (H) 8 - 23 mg/dL   Creatinine, Ser 8.08 (H) 0.61 - 1.24 mg/dL   Calcium  9.5 8.9 - 10.3 mg/dL   Total Protein 6.3 (L) 6.5 - 8.1 g/dL   Albumin  2.9 (L) 3.5 - 5.0 g/dL   AST 25 15 - 41 U/L   ALT 53 (H) 0 - 44 U/L   Alkaline Phosphatase 76 38 - 126 U/L   Total Bilirubin 0.8 0.0 - 1.2 mg/dL   GFR, Estimated 37 (L) >60 mL/min    Comment: (NOTE) Calculated using the CKD-EPI Creatinine Equation (2021)    Anion gap 13 5 - 15    Comment: Performed at [facility], [address], [place], [place] [address]  CBC with Differential/Platelet     Status: Abnormal   Collection Time: 05/27/24  2:15 AM  Result Value Ref Range   WBC 8.4 4.0 - 10.5 K/uL   RBC 4.26 4.22 - 5.81 MIL/uL   Hemoglobin 12.7 (L) 13.0 - 17.0 g/dL   HCT  62.7 (L) 60.9 - 52.0 %   MCV 87.3 80.0 - 100.0 fL   MCH 29.8 26.0 - 34.0 pg   MCHC 34.1 30.0 - 36.0 g/dL   RDW 84.6 88.4 - 84.4 %   Platelets 246 150 - 400 K/uL   nRBC 0.0 0.0 - 0.2 %   Neutrophils Relative % 60 %   Neutro Abs 5.0 1.7 - 7.7 K/uL   Lymphocytes Relative 14 %   Lymphs Abs  1.2 0.7 - 4.0 K/uL   Monocytes Relative 17 %   Monocytes Absolute 1.4 (H) 0.1 - 1.0 K/uL   Eosinophils Relative 7 %   Eosinophils Absolute 0.6 (H) 0.0 - 0.5 K/uL   Basophils Relative 1 %   Basophils Absolute 0.1 0.0 - 0.1 K/uL   Immature Granulocytes 1 %   Abs Immature Granulocytes 0.09 (H) 0.00 - 0.07 K/uL    Comment: Performed at [facility], [address], [place], [place] [address]  Phosphorus     Status: None   Collection Time: 05/27/24  2:15 AM  Result Value Ref Range   Phosphorus 3.8 2.5 - 4.6 mg/dL    Comment: Performed at [facility], [address], [place], [place] [address]  Prealbumin     Status: None   Collection Time: 05/27/24  2:15 AM  Result Value Ref Range   Prealbumin 29 18 - 38 mg/dL    Comment: Performed at [facility], [address], [place], [place] [address]  Glucose, capillary     Status: Abnormal   Collection Time: 05/27/24  7:25 AM  Result Value Ref Range   Glucose-Capillary 106 (H) 70 - 99 mg/dL    Comment: Glucose reference range applies only to samples taken after fasting for at least 8 hours.  Glucose, capillary     Status: Abnormal   Collection Time: 05/27/24 11:53 AM  Result Value Ref Range   Glucose-Capillary 139 (H) 70 - 99 mg/dL    Comment: Glucose reference range applies only to samples taken after fasting for at least 8 hours.   Portable chest 1 View Result Date: 05/26/2024 CLINICAL DATA:  Shortness of breath and sepsis EXAM: PORTABLE CHEST 1 VIEW COMPARISON:  05/09/2024 FINDINGS: Low lung volumes accentuate cardiomediastinal silhouette and pulmonary vascularity. No focal consolidation, pleural effusion, or pneumothorax. No displaced  rib fractures. IMPRESSION: No active disease. Electronically Signed   By: Norman Gatlin M.D.   On: 05/26/2024 01:11      Blood pressure (!) 87/58, pulse 81, temperature 98.5 F (36.9 C), temperature source Oral, resp. rate 20, height 5' 11 (1.803 m), weight 92.5 kg, SpO2 100%.  Medical Problem List and Plan: 1. Functional deficits secondary to debility 2/2 perforated bowel  -patient may not shower due to wound vac  -ELOS/Goals: 10-14 days S  Admit to CIR  Start Metanx  2.  Antithrombotics: -DVT/anticoagulation:  Mechanical:  Antiembolism stockings, knee (TED hose) Bilateral lower extremities  -antiplatelet therapy: continue Eliquis  3. Pain Management: continue Scheduled Tylenol ; Oxycodone  as needed  4. Mood/Behavior/Sleep: LCSW to follow for evaluation and support when available.   -antipsychotic agents: N/A  -anxiety: continue Atarax  25 mg  5. Neuropsych/cognition: This patient is capable of making decisions on his own behalf.  6. Complicated wound: WOC nurse consultation  - Continue wound VAC  Zosyn  d/ced as per surgery recommendation  7. 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 AKI/CKD stage IIIb: Creatinine improving 2.3 now 1.91 -monitor BMP   - On continuous IVF NS 125 mL/hr  9. DM type II: 04/01/24 Hemoglobin A1c 5.3  -Monitor BS AC/at bedtime and use SSI 10. Hypotension: Order orthostatic vitals   - teds ordered  -  increase midodrine  to 10mg  TID  11. Obesity: BMI 31.49 kg/m  -educate on diet and weight loss to promote overall health and mobility.    12. Hyperlipidemia: Continue Crestor   13. Screening for vitamin D  deficiency:check vitamin D  level  tomorrow  I have personally performed a face to face diagnostic evaluation, including, but not limited to relevant history and physical exam findings, of this patient and developed relevant assessment and plan.  Additionally, I have reviewed and concur with the physician assistant's documentation above.  Daphne Finders, NP  Sven SHAUNNA Elks, MD 05/27/2024

## 2024-05-27 NOTE — H&P (Signed)
 Physical Medicine and Rehabilitation Admission H&P    CC: Debility 2/2 perforated bowel : HPI: Kristopher Thompson is a 73 year old male with PMHx significant of HTN, chronic diastolic congestive heart failure, CAD, DM2, pulmonary embolism (on Eliquis ), anemia, obesity, prostate CA and bladder CA.  Patient was initially admitted by general surgery on 04/10/2024 for operative management regarding parastomal and incisional incarcerated abdominal wall hernias which required extensive lysis, mesh insertion, repair, urostomy ileal conduit revision and was admitted to ICU due to tachycardia and tachypnea.  Hospital was course complicated by perforated bowel and postop shock as well as AKI requiring HD.  He was stabilized and discharged to SNF on 7/11 but returned at the request due to increased output of ostomy therefore he was directly admitted.  Urology consulted for urostomy evaluation and continue to follow. General surgery consulted and recommending continuing wound VAC and bowel regimen for ileostomy.  Admission labs creatinine 2.3. DVT prophylaxis Eliquis . Currently requiring min assist +2 safety/equipment for transfers and needs assistance for overall bed mobility.  Per chart review patient lives in a 1 level home with spouse. Therapy evaluations completed due to patient decreased functional mobility was admitted for a comprehensive rehab program. He complains of pain at ostomy site.   ROS +pain at ostomy site Past Medical History:  Diagnosis Date   At risk for sleep apnea    12-25-2017   STOP-BANG SCORE= 5   --- SENT TO PCP   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   moderate atypia - right low back   Atypical nevus 04/04/2007   moderate to marked - right upper back (wider shave)   Atypical nevus 04/04/2007   moderate atypia - center chest (wider shave)   Atypical nevus 04/04/2007   slight atypia - right thigh   Atypical nevus 11/29/2011   mild atypia - center upper back   Atypical nevus 11/29/2011    mild atypia - center chest   Bacteremia due to Klebsiella pneumoniae 10/09/2017   Bladder cancer (HCC) dx 07/2017   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 denies   Sleep apnea    cpap   Squamous cell carcinoma of skin 05/22/2013   left cheek - CX3 + 5FU   Wears glasses    Past Surgical History:  Procedure Laterality Date   ABDOMINAL SURGERY     APPENDECTOMY  1972   BOWEL RESECTION N/A 04/20/2024   Procedure: SMALL BOWEL RESECTION;  Surgeon: [name], MD;  Location: [facility];  Service: General;  Laterality: N/A;   CHOLECYSTECTOMY  1985   COLONOSCOPY N/A 06/29/2021   Procedure: COLONOSCOPY;  Surgeon: Debby Hila, MD;  [facility];  Service: Endoscopy;  Laterality: N/A;   COLOSTOMY REVERSAL N/A 01/08/2018   Procedure: COLOSTOMY REVERSAL;  Surgeon: Debby Hila, MD;  [facility];  Service: General;  Laterality: N/A;   CYSTOSCOPY WITH RETROGRADE PYELOGRAM,  URETEROSCOPY AND STENT PLACEMENT Right 06/10/2017   Procedure: CYSTOSCOPY WITH RIGHT URETEROSCOPY WITH RIGHT STENT PLACEMENT;  Surgeon: [name], MD;  [facility];  Service: Urology;  Laterality: Right;   EUS N/A 04/16/2018   Procedure: FULL UPPER ENDOSCOPIC ULTRASOUND (EUS) RADIAL;  Surgeon: Burnette Fallow, MD;  [facility];  Service: Endoscopy;  Laterality: N/A;   FLEXIBLE SIGMOIDOSCOPY N/A 12/13/2017   Procedure: FLEXIBLE SIGMOIDOSCOPY;  Surgeon: Debby Hila, MD;  [facility];  Service: Endoscopy;  Laterality: N/A;   ILEO CONDUIT     IR CATHETER TUBE CHANGE  12/13/2017   IR CATHETER TUBE CHANGE  01/17/2018   IR CATHETER TUBE CHANGE  02/28/2018   IR CATHETER TUBE CHANGE  07/18/2018   IR CATHETER TUBE CHANGE  08/22/2018   IR CATHETER TUBE CHANGE  10/31/2018   IR CONVERT LEFT NEPHROSTOMY TO NEPHROURETERAL CATH  10/24/2017   IR EXT NEPHROURETERAL CATH EXCHANGE  05/19/2018   IR EXT NEPHROURETERAL CATH EXCHANGE  06/16/2018   IR NEPHRO TUBE REMOV/FL  10/24/2017   IR NEPHROSTOGRAM LEFT THRU EXISTING ACCESS  12/05/2018   IR NEPHROSTOMY EXCHANGE LEFT  05/11/2024   IR NEPHROSTOMY PLACEMENT LEFT  10/07/2017   IR NEPHROSTOMY PLACEMENT RIGHT  05/04/2024   IR NEPHROSTOMY TUBE CHANGE  04/11/2018   IR URETERAL STENT PLACEMENT EXISTING ACCESS LEFT  05/13/2024   IR URETERAL STENT RIGHT NEW ACCESS W/SEP NEPHROSTOMY CATH  05/13/2024   LAPAROTOMY N/A 04/20/2024   Procedure: EXPLORATORY LAPAROTOMY;  Surgeon: [name], MD;  [facility];  Service: General;  Laterality: N/A;   PARTIAL COLECTOMY N/A 04/21/2024   Procedure: COLECTOMY, PARTIAL;  Surgeon: [name], MD;  [facility];  Service: General;  Laterality: N/A;  Removal Wound Vac, Washout Ostomy, Possible Anastomosis, Possible Ileostomy.  Phasix Mesh.   POLYPECTOMY  06/29/2021   Procedure: POLYPECTOMY;  Surgeon: Debby Hila, MD;  [facility];  Service: Endoscopy;;   ROBOT ASSISTED LAPAROSCOPIC RADICAL PROSTATECTOMY  10/02/2012   Procedure: ROBOTIC ASSISTED LAPAROSCOPIC RADICAL PROSTATECTOMY LEVEL 2;  Surgeon: Noretta Renda, MD;  [facility];  Service: Urology;  Laterality: N/A;   ROBOTIC ASSISTED LAPAROSCOPIC BLADDER DIVERTICULECTOMY N/A 08/08/2017   Procedure: XI ROBOTIC ASSISTED LAPAROSCOPIC RADICAL CYSTECTOMY COVERTED TO OPEN PELVIC LYMPHADNECTOMY BILATERAL AND ILEAL CONDUIT URINARY DIVERSION;  Surgeon: [name], MD;  [facility];  Service: Urology;  Laterality: N/A;    TRANSURETHRAL RESECTION OF BLADDER TUMOR  06/10/2017   Procedure: TRANSURETHRAL RESECTION OF BLADDER TUMOR (TURBT);  Surgeon: [name], MD;  [facility];  Service: Urology;;   VACUUM ASSISTED CLOSURE CHANGE N/A 04/20/2024   Procedure: PLACEMENT OF CONCETTA CAPES;  Surgeon: [name], MD;  [facility];  Service: General;  Laterality: N/A;   VENTRAL HERNIA REPAIR N/A 04/10/2024   Procedure: REPAIR, HERNIA, VENTRAL;  Surgeon: [name], MD;  [facility];  Service: General;  Laterality: N/A;   XI ROBOT ABDOMINAL PERINEAL RESECTION N/A 08/08/2017   Procedure: REPAIR OF RECTAL TEAR POSSIBLE PARTIAL PROCTECTOMY, CREATION OF  OSTOMY;  Surgeon: Debby Hila, MD;  [facility];  Service: General;  Laterality: N/A;   XI ROBOTIC ASSISTED PARASTOMAL HERNIA REPAIR N/A 04/10/2024   Procedure: REPAIR, HERNIA, PARASTOMAL AND VENTRAL HERNIAS, ROBOT-ASSISTED, LEFT INGUINAL HERNIA, LYSIS OF ADHESIONS INCARCERATED AND INSIONAL HERNIAS AND UROSTOMY REVISION;  Surgeon: [name], MD;  [facility];  Service: General;  Laterality: N/A;   Family History  Problem Relation Age of Onset   Lung cancer Mother  Hypertension Father    Colon cancer Other    CAD Neg Hx    Diabetes Neg Hx    Stroke Neg Hx    Social History:  reports that he quit smoking about 44 years ago. His smoking use included cigarettes. He started smoking about 53 years ago. He has never used smokeless tobacco. He reports current alcohol  use. He reports that he does not use drugs. Allergies:  Allergies  Allergen Reactions   Demerol  [Meperidine ] Nausea And Vomiting   Medications Prior to Admission  Medication Sig Dispense Refill   acetaminophen  (TYLENOL ) 500 MG tablet Take 1,000 mg by mouth every 8 (eight) hours as needed for mild pain or headache.      atorvastatin (LIPITOR) 20 MG tablet Take 20 mg by mouth at bedtime.     bisacodyl  (DULCOLAX) 5 MG EC tablet Take 2 tablets (10 mg total) by mouth daily as needed  for moderate constipation. (Patient taking differently: Take 10 mg by mouth daily as needed (constipation).) 20 tablet 0   cyclobenzaprine  (FLEXERIL ) 5 MG tablet Take 1 tablet (5 mg total) by mouth 3 (three) times daily as needed for muscle spasms. 20 tablet 1   diphenhydrAMINE  (BENADRYL ) 25 mg capsule Take 25 mg by mouth daily as needed for allergies (congestion).     ELIQUIS  5 MG TABS tablet Take 5 mg by mouth 2 (two) times daily.     hydrOXYzine  (ATARAX ) 25 MG tablet Take 1 tablet (25 mg total) by mouth 3 (three) times daily as needed for anxiety.     ipratropium (ATROVENT ) 0.03 % nasal spray Place 1 spray into both nostrils every 8 (eight) hours as needed (allergies).     metFORMIN (GLUCOPHAGE) 500 MG tablet Take 500 mg by mouth daily.     metoCLOPramide  (REGLAN ) 5 MG tablet Take 1 tablet (5 mg total) by mouth every 8 (eight) hours as needed for nausea. 30 tablet 2   Multiple Vitamin (MULTIVITAMIN PO) Take 1 tablet by mouth daily.     Nutritional Supplements (NUTRITIONAL DRINK) LIQD Take 60 mLs by mouth 2 (two) times daily. house supplement     Omega-3 Fatty Acids (FISH OIL) 1000 MG CAPS Take 1,000 mg by mouth every Monday, Wednesday, and Friday.     ondansetron  (ZOFRAN ) 4 MG tablet Take 1 tablet (4 mg total) by mouth daily as needed for nausea or vomiting. 30 tablet 1   oxyCODONE -acetaminophen  (PERCOCET) 5-325 MG tablet Take 1 tablet by mouth every 6 (six) hours as needed for severe pain (pain score 7-10). Can increase to 2 pills at a time if needed 20 tablet 0   polycarbophil (FIBERCON) 625 MG tablet Take 1 tablet (625 mg total) by mouth 2 (two) times daily. 60 tablet 2   polyethylene glycol powder (GLYCOLAX /MIRALAX ) 17 GM/SCOOP powder Take 17 g by mouth daily as needed (constipation).     vitamin B-12 (CYANOCOBALAMIN ) 500 MCG tablet Take 500 mcg by mouth every Monday, Wednesday, and Friday.     ZINC OXIDE EX Apply 1 application  topically See admin instructions. Cleanse sacrum with wound  cleanser; pat dry; apply zinc oxide ointment and leave open to air. Every day shift for wound care.     Amino Acids -Protein Hydrolys (FEEDING SUPPLEMENT, PRO-STAT SUGAR FREE 64,) LIQD Take 90 mLs by mouth daily. (Patient not taking: Reported on 05/26/2024)     feeding supplement (ENSURE PLUS HIGH PROTEIN) LIQD Take 237 mLs by mouth 2 (two) times daily between meals. (Patient not taking: Reported on 05/26/2024)  loperamide  (IMODIUM ) 2 MG capsule Take 1 capsule (2 mg total) by mouth 2 (two) times daily. (Patient not taking: Reported on 05/26/2024) 30 capsule 0   nutrition supplement, JUVEN, (JUVEN) PACK Take 1 packet by mouth 2 (two) times daily between meals. (Patient not taking: Reported on 05/26/2024)     Nutritional Supplements (,FEEDING SUPPLEMENT, PROSOURCE PLUS) liquid Take 30 mLs by mouth 2 (two) times daily between meals. (Patient not taking: Reported on 05/26/2024)     sodium chloride  (OCEAN) 0.65 % SOLN nasal spray Place 1-2 sprays into both nostrils every 6 (six) hours as needed for congestion (dry nose). (Patient not taking: Reported on 05/26/2024)        Home: Home Living Family/patient expects to be discharged to:: Private residence Living Arrangements: Spouse/significant other Available Help at Discharge: Family, Available 24 hours/day Type of Home: House Home Access: Stairs to enter [employer] of Steps: 1 Home Layout: One level Bathroom Shower/Tub: Engineer, manufacturing systems: Standard Home Equipment: Rollator (4 wheels)   Functional History: Prior Function Prior Level of Function : Independent/Modified Independent (prior to admission in May 2025) Mobility Comments: pt has mobilized minimally since discharge to SNF ADLs Comments: assisted for ADLs since recent admission  Functional Status:  Mobility: Bed Mobility Overal bed mobility: Needs Assistance Bed Mobility: Supine to Sit Supine to sit: Mod assist, HOB elevated, Used rails General bed mobility  comments: increased time, assist to for hips to EOB and to raise trunk Transfers Overall transfer level: Needs assistance Equipment used: Rolling walker (2 wheels) Transfers: Sit to/from Stand, Bed to chair/wheelchair/BSC Sit to Stand: Min assist, +2 safety/equipment Bed to/from chair/wheelchair/BSC transfer type:: Step pivot Step pivot transfers: Min assist, +2 safety/equipment General transfer comment: assist to rise and steady, pt immediately requesting to step to chair, appeared fatigued after transfer      ADL: ADL Overall ADL's : Needs assistance/impaired Eating/Feeding: Set up, Sitting Grooming: Set up, Sitting Upper Body Bathing: Minimal assistance, Sitting Lower Body Bathing: Total assistance, Sit to/from stand, +2 for safety/equipment Upper Body Dressing : Minimal assistance, Sitting Lower Body Dressing: Total assistance, Sit to/from stand, +2 for safety/equipment Toilet Transfer: Minimal assistance, +2 for safety/equipment, Rolling walker (2 wheels) Toileting- Clothing Manipulation and Hygiene: Total assistance, +2 for safety/equipment, Sit to/from stand  Cognition: Cognition Orientation Level: Oriented X4 Cognition Arousal: Alert Behavior During Therapy: Flat affect, Anxious  Physical Exam: Blood pressure (!) 85/50, pulse 100, temperature 98.8 F (37.1 C), temperature source Oral, resp. rate 20, height 5' 11 (1.803 m), weight 102.4 kg, SpO2 100%. Physical Exam Gen: no distress, normal appearing HEENT: oral mucosa pink and moist, NCAT Cardio: Reg rate Chest: normal effort, normal rate of breathing Abd: soft, non-distended Ext: no edema Psych: pleasant, normal affect Skin: intact Neuro: Alert and oriented x3 MSK: 4/5 strength throughout  Results for orders placed or performed during the hospital encounter of 05/25/24 (from the past 48 hours)  Brain natriuretic peptide     Status: None   Collection Time: 05/25/24 11:38 PM  Result Value Ref Range   B  Natriuretic Peptide 24.6 0.0 - 100.0 pg/mL    Comment: Performed at [facility], [address], [place], [place] [address]  Culture, blood (Routine X 2) w Reflex to ID Panel     Status: None (Preliminary result)   Collection Time: 05/26/24 12:28 AM   Specimen: BLOOD LEFT ARM  Result Value Ref Range   Specimen Description BLOOD LEFT ARM    Special Requests  BOTTLES DRAWN AEROBIC AND ANAEROBIC Blood Culture adequate volume   Culture      NO GROWTH 1 DAY Performed at [facility], [address], [place], [place] [address]    Report Status PENDING   Comprehensive metabolic panel     Status: Abnormal   Collection Time: 05/26/24 12:53 AM  Result Value Ref Range   Sodium 125 (L) 135 - 145 mmol/L   Potassium 4.5 3.5 - 5.1 mmol/L   Chloride 98 98 - 111 mmol/L   CO2 14 (L) 22 - 32 mmol/L   Glucose, Bld 114 (H) 70 - 99 mg/dL    Comment: Glucose reference range applies only to samples taken after fasting for at least 8 hours.   BUN 93 (H) 8 - 23 mg/dL   Creatinine, Ser 7.75 (H) 0.61 - 1.24 mg/dL   Calcium  9.8 8.9 - 10.3 mg/dL   Total Protein 6.9 6.5 - 8.1 g/dL   Albumin  3.1 (L) 3.5 - 5.0 g/dL   AST 42 (H) 15 - 41 U/L   ALT 81 (H) 0 - 44 U/L   Alkaline Phosphatase 96 38 - 126 U/L   Total Bilirubin 0.4 0.0 - 1.2 mg/dL   GFR, Estimated 31 (L) >60 mL/min    Comment: (NOTE) Calculated using the CKD-EPI Creatinine Equation (2021)    Anion gap 13 5 - 15    Comment: Performed at [facility], [address], [place], [place] [address]  Magnesium      Status: None   Collection Time: 05/26/24 12:53 AM  Result Value Ref Range   Magnesium  2.4 1.7 - 2.4 mg/dL    Comment: Performed at [facility], [address], [place], [place] [address]  Phosphorus     Status: Abnormal   Collection Time: 05/26/24 12:53 AM  Result Value Ref Range   Phosphorus 5.2 (H) 2.5 - 4.6 mg/dL    Comment: Performed at [facility], [address], [place], [place] [address]  CBC with  Differential/Platelet     Status: Abnormal   Collection Time: 05/26/24 12:53 AM  Result Value Ref Range   WBC 11.9 (H) 4.0 - 10.5 K/uL   RBC 5.00 4.22 - 5.81 MIL/uL   Hemoglobin 14.9 13.0 - 17.0 g/dL   HCT 54.8 60.9 - 47.9 %   MCV 90.2 80.0 - 100.0 fL   MCH 29.8 26.0 - 34.0 pg   MCHC 33.0 30.0 - 36.0 g/dL   RDW 84.6 88.4 - 84.4 %   Platelets 259 150 - 400 K/uL   nRBC 0.0 0.0 - 0.2 %   Neutrophils Relative % 62 %   Neutro Abs 7.5 1.7 - 7.7 K/uL   Lymphocytes Relative 17 %   Lymphs Abs 2.0 0.7 - 4.0 K/uL   Monocytes Relative 13 %   Monocytes Absolute 1.6 (H) 0.1 - 1.0 K/uL   Eosinophils Relative 4 %   Eosinophils Absolute 0.5 0.0 - 0.5 K/uL   Basophils Relative 2 %   Basophils Absolute 0.2 (H) 0.0 - 0.1 K/uL   Immature Granulocytes 2 %   Abs Immature Granulocytes 0.19 (H) 0.00 - 0.07 K/uL    Comment: Performed at [facility], [address], [place], [place] [address]  Protime-INR     Status: Abnormal   Collection Time: 05/26/24 12:53 AM  Result Value Ref Range   Prothrombin Time 16.3 (H) 11.4 - 15.2 seconds   INR 1.2 0.8 - 1.2  Comment: (NOTE) INR goal varies based on device and disease states. Performed at [facility], [address] [place], [place] [address]   TSH     Status: None   Collection Time: 05/26/24 12:53 AM  Result Value Ref Range   TSH 1.457 0.350 - 4.500 uIU/mL    Comment: Performed by a 3rd Generation assay with a functional sensitivity of <=0.01 uIU/mL. Performed at [facility], [address], [place], [place] [address]   Culture, blood (Routine X 2) w Reflex to ID Panel     Status: None (Preliminary result)   Collection Time: 05/26/24 12:53 AM   Specimen: BLOOD RIGHT HAND  Result Value Ref Range   Specimen Description BLOOD RIGHT HAND    Special Requests      BOTTLES DRAWN AEROBIC AND ANAEROBIC Blood Culture adequate volume   Culture      NO GROWTH 1 DAY Performed at [facility], [address], [place], [place]  [address]    Report Status PENDING   C-reactive protein     Status: Abnormal   Collection Time: 05/26/24 12:53 AM  Result Value Ref Range   CRP 1.1 (H) <1.0 mg/dL    Comment: Performed at [facility], [address] [place], [place] [address]  Procalcitonin     Status: None   Collection Time: 05/26/24 12:53 AM  Result Value Ref Range   Procalcitonin <0.10 ng/mL    Comment:        Interpretation: PCT (Procalcitonin) <= 0.5 ng/mL: Systemic infection (sepsis) is not likely. Local bacterial infection is possible. (NOTE)       Sepsis PCT Algorithm           Lower Respiratory Tract                                      Infection PCT Algorithm    ----------------------------     ----------------------------         PCT < 0.25 ng/mL                PCT < 0.10 ng/mL          Strongly encourage             Strongly discourage   discontinuation of antibiotics    initiation of antibiotics    ----------------------------     -----------------------------       PCT 0.25 - 0.50 ng/mL            PCT 0.10 - 0.25 ng/mL               OR       >80% decrease in PCT            Discourage initiation of                                            antibiotics      Encourage discontinuation           of antibiotics    ----------------------------     -----------------------------         PCT >= 0.50 ng/mL              PCT 0.26 - 0.50 ng/mL  AND        <80% decrease in PCT             Encourage initiation of                                             antibiotics       Encourage continuation           of antibiotics    ----------------------------     -----------------------------        PCT >= 0.50 ng/mL                  PCT > 0.50 ng/mL               AND         increase in PCT                  Strongly encourage                                      initiation of antibiotics    Strongly encourage escalation           of antibiotics                                      -----------------------------                                           PCT <= 0.25 ng/mL                                                 OR                                        > 80% decrease in PCT                                      Discontinue / Do not initiate                                             antibiotics  Performed at [facility], [address] [place], [place] [address]   Glucose, capillary     Status: Abnormal   Collection Time: 05/26/24 12:55 AM  Result Value Ref Range   Glucose-Capillary 118 (H) 70 - 99 mg/dL    Comment: Glucose reference range applies only to samples taken after fasting for at least 8 hours.  Comprehensive metabolic panel     Status: Abnormal   Collection Time: 05/26/24  4:23 AM  Result Value Ref Range   Sodium 125 (L) 135 - 145 mmol/L   Potassium 4.4  3.5 - 5.1 mmol/L   Chloride 97 (L) 98 - 111 mmol/L   CO2 13 (L) 22 - 32 mmol/L   Glucose, Bld 111 (H) 70 - 99 mg/dL    Comment: Glucose reference range applies only to samples taken after fasting for at least 8 hours.   BUN 94 (H) 8 - 23 mg/dL   Creatinine, Ser 7.68 (H) 0.61 - 1.24 mg/dL   Calcium  9.6 8.9 - 10.3 mg/dL   Total Protein 6.7 6.5 - 8.1 g/dL   Albumin  3.3 (L) 3.5 - 5.0 g/dL   AST 34 15 - 41 U/L   ALT 72 (H) 0 - 44 U/L   Alkaline Phosphatase 89 38 - 126 U/L   Total Bilirubin 0.8 0.0 - 1.2 mg/dL   GFR, Estimated 29 (L) >60 mL/min    Comment: (NOTE) Calculated using the CKD-EPI Creatinine Equation (2021)    Anion gap 15 5 - 15    Comment: Performed at [facility], [address], [place], [place] [address]  CBC     Status: Abnormal   Collection Time: 05/26/24  4:23 AM  Result Value Ref Range   WBC 12.6 (H) 4.0 - 10.5 K/uL   RBC 4.52 4.22 - 5.81 MIL/uL   Hemoglobin 13.4 13.0 - 17.0 g/dL   HCT 60.7 60.9 - 47.9 %   MCV 86.7 80.0 - 100.0 fL   MCH 29.6 26.0 - 34.0 pg   MCHC 34.2 30.0 - 36.0 g/dL   RDW 84.7 88.4 - 84.4 %   Platelets 297 150 - 400 K/uL   nRBC  0.0 0.0 - 0.2 %    Comment: Performed at [facility], [address], [place], [place] [address]  Lactic acid, plasma     Status: None   Collection Time: 05/26/24  4:23 AM  Result Value Ref Range   Lactic Acid, Venous 1.5 0.5 - 1.9 mmol/L    Comment: Performed at [facility], [address], [place], [place] [address]  Blood gas, venous     Status: Abnormal   Collection Time: 05/26/24  4:23 AM  Result Value Ref Range   pH, Ven 7.33 7.25 - 7.43   pCO2, Ven 27 (L) 44 - 60 mmHg   pO2, Ven 60 (H) 32 - 45 mmHg   Bicarbonate 14.2 (L) 20.0 - 28.0 mmol/L   Acid-base deficit 10.1 (H) 0.0 - 2.0 mmol/L   O2 Saturation 90.5 %   Patient temperature 36.7    Collection site BLOOD LEFT HAND    Drawn by DRAWN BY RN     Comment: Performed at [facility], [address] [place], [place] [address]  Glucose, capillary     Status: Abnormal   Collection Time: 05/26/24  7:35 AM  Result Value Ref Range   Glucose-Capillary 116 (H) 70 - 99 mg/dL    Comment: Glucose reference range applies only to samples taken after fasting for at least 8 hours.  Lactic acid, plasma     Status: None   Collection Time: 05/26/24  8:19 AM  Result Value Ref Range   Lactic Acid, Venous 1.4 0.5 - 1.9 mmol/L    Comment: Performed at [facility], [address] [place], [place] [address]  Glucose, capillary     Status: Abnormal   Collection Time: 05/26/24 12:33 PM  Result Value Ref Range   Glucose-Capillary 142 (H) 70 - 99 mg/dL    Comment: Glucose reference range applies  only to samples taken after fasting for at least 8 hours.  Creatinine, fluid (pleural, peritoneal, JP Drainage)     Status: None   Collection Time: 05/26/24 12:44 PM  Result Value Ref Range   Creat, Fluid 2.1 mg/dL    Comment: (NOTE) No normal range established for this test Results should be evaluated in conjunction with serum values    Fluid Type-FCRE JP Drainage     Comment: Performed at [facility], [address] [place], [place] [address]  Glucose, capillary     Status: Abnormal   Collection Time: 05/26/24  4:33 PM  Result Value Ref Range   Glucose-Capillary 109 (H) 70 - 99 mg/dL    Comment: Glucose reference range applies only to samples taken after fasting for at least 8 hours.  Glucose, capillary     Status: Abnormal   Collection Time: 05/26/24  8:52 PM  Result Value Ref Range   Glucose-Capillary 118 (H) 70 - 99 mg/dL    Comment: Glucose reference range applies only to samples taken after fasting for at least 8 hours.  Magnesium      Status: None   Collection Time: 05/27/24  2:15 AM  Result Value Ref Range   Magnesium  2.2 1.7 - 2.4 mg/dL    Comment: Performed at [facility], [address] [place], [place] [address]  Comprehensive metabolic panel with GFR     Status: Abnormal   Collection Time: 05/27/24  2:15 AM  Result Value Ref Range   Sodium 130 (L) 135 - 145 mmol/L   Potassium 4.1 3.5 - 5.1 mmol/L   Chloride 101 98 - 111 mmol/L   CO2 16 (L) 22 - 32 mmol/L   Glucose, Bld 98 70 - 99 mg/dL    Comment: Glucose reference range applies only to samples taken after fasting for at least 8 hours.   BUN 82 (H) 8 - 23 mg/dL   Creatinine, Ser 8.08 (H) 0.61 - 1.24 mg/dL   Calcium  9.5 8.9 - 10.3 mg/dL   Total Protein 6.3 (L) 6.5 - 8.1 g/dL   Albumin  2.9 (L) 3.5 - 5.0 g/dL   AST 25 15 - 41 U/L   ALT 53 (H) 0 - 44 U/L   Alkaline Phosphatase 76 38 - 126 U/L   Total Bilirubin 0.8 0.0 - 1.2 mg/dL   GFR, Estimated 37 (L) >60 mL/min    Comment: (NOTE) Calculated using the CKD-EPI Creatinine Equation (2021)    Anion gap 13 5 - 15    Comment: Performed at [facility], [address] [place], [place] [address]  CBC with Differential/Platelet     Status: Abnormal   Collection Time: 05/27/24  2:15 AM  Result Value Ref Range   WBC 8.4 4.0 - 10.5 K/uL   RBC 4.26 4.22 - 5.81 MIL/uL   Hemoglobin 12.7 (L) 13.0 - 17.0 g/dL   HCT 62.7 (L) 60.9 - 47.9 %   MCV 87.3 80.0 - 100.0 fL   MCH 29.8  26.0 - 34.0 pg   MCHC 34.1 30.0 - 36.0 g/dL   RDW 84.6 88.4 - 84.4 %   Platelets 246 150 - 400 K/uL   nRBC 0.0 0.0 - 0.2 %   Neutrophils Relative % 60 %   Neutro Abs 5.0 1.7 - 7.7 K/uL   Lymphocytes Relative 14 %   Lymphs Abs 1.2 0.7 - 4.0 K/uL   Monocytes Relative 17 %   Monocytes Absolute 1.4 (H) 0.1 -  1.0 K/uL   Eosinophils Relative 7 %   Eosinophils Absolute 0.6 (H) 0.0 - 0.5 K/uL   Basophils Relative 1 %   Basophils Absolute 0.1 0.0 - 0.1 K/uL   Immature Granulocytes 1 %   Abs Immature Granulocytes 0.09 (H) 0.00 - 0.07 K/uL    Comment: Performed at [facility], [address], [place], [place] [address]  Phosphorus     Status: None   Collection Time: 05/27/24  2:15 AM  Result Value Ref Range   Phosphorus 3.8 2.5 - 4.6 mg/dL    Comment: Performed at [facility], [address], [place], [place] [address]  Prealbumin     Status: None   Collection Time: 05/27/24  2:15 AM  Result Value Ref Range   Prealbumin 29 18 - 38 mg/dL    Comment: Performed at [facility], [address], [place], [place] [address]  Glucose, capillary     Status: Abnormal   Collection Time: 05/27/24  7:25 AM  Result Value Ref Range   Glucose-Capillary 106 (H) 70 - 99 mg/dL    Comment: Glucose reference range applies only to samples taken after fasting for at least 8 hours.  Glucose, capillary     Status: Abnormal   Collection Time: 05/27/24 11:53 AM  Result Value Ref Range   Glucose-Capillary 139 (H) 70 - 99 mg/dL    Comment: Glucose reference range applies only to samples taken after fasting for at least 8 hours.   Portable chest 1 View Result Date: 05/26/2024 CLINICAL DATA:  Shortness of breath and sepsis EXAM: PORTABLE CHEST 1 VIEW COMPARISON:  05/09/2024 FINDINGS: Low lung volumes accentuate cardiomediastinal silhouette and pulmonary vascularity. No focal consolidation, pleural effusion, or pneumothorax. No displaced rib fractures. IMPRESSION: No active disease. Electronically  Signed   By: Norman Gatlin M.D.   On: 05/26/2024 01:11      Blood pressure (!) 85/50, pulse 100, temperature 98.8 F (37.1 C), temperature source Oral, resp. rate 20, height 5' 11 (1.803 m), weight 102.4 kg, SpO2 100%.  Medical Problem List and Plan: 1. Functional deficits secondary to debility 2/2 perforated bowel  -patient may not shower due to wound vac  -ELOS/Goals: 10-14 days S  Admit to CIR 2.  Antithrombotics: -DVT/anticoagulation:  Mechanical:  Antiembolism stockings, knee (TED hose) Bilateral lower extremities  -antiplatelet therapy: Eliquis  3. Pain Management: Scheduled Tylenol ; Oxycodone  as needed 4. Mood/Behavior/Sleep: LCSW to follow for evaluation and support when available.   -antipsychotic agents: N/A  -anxiety: continue Atarax  25 mg  5. Neuropsych/cognition: This patient is capable of making decisions on his own behalf. 6. Skin/Wound Care: WOC nurse consultation  - Continue wound VAC  -??  About continuing Zosyn  SEE D/C note  7. Fluids/Electrolytes/Nutrition: Monitor I's & O's--Recheck monitor labs routinely CBC/CMP   - Bowel regimen for ileostomy: Continue FiberCon and loperamide  twice daily Goal is [phone] mL out fecal ileostomy a day   - Carb/cardiac diet -continue Juven/Prosource/Ensure supplements   -Nausea/vomiting:  seems to have improved- PRN orders Reglan  and Zofran    8. AKI/CKD stage IIIb: Creatinine improving 2.3 now 1.91 -monitor BMP   - On continuous IVF NS 125 mL/hr  9. DM type II: 04/01/24 Hemoglobin A1c 5.3  -Monitor BS AC/at bedtime and use SSI 10. Hypotension: Order orthostatic vitals   - continue protamine 5 mg 3 times daily 11. Obesity: BMI 31.49 kg/m  -educate on diet and weight loss to promote overall health and mobility.  12. Hyperlipidemia: Continue Crestor   I have personally performed a face to face diagnostic evaluation, including, but not limited to relevant history and physical exam findings, of this patient and developed  relevant assessment and plan.  Additionally, I have reviewed and concur with the physician assistant's documentation above.  SABRAap  Brandi LITTIE Leak, NP 05/27/2024

## 2024-05-27 NOTE — Progress Notes (Signed)
 PMR Admission Coordinator Pre-Admission Assessment   Patient: Kristopher Thompson is an 73 y.o., male MRN: [id number] DOB: 02-May-1951 Height: 5' 11 (180.3 cm) Weight: 102.4 kg   Insurance Information HMO:  yes   PPO:      PCP:      IPA:      80/20:      OTHER:  PRIMARY: Medicare A and B     [id number]    Subscriber: pt Benefits:  Phone #: passport one source     Name: 6/29 Eff. Date: 12/13/02     Deduct: $1676      Out of Pocket Max: none      Life Max: none CIR: 100%      SNF: 20 dull days Outpatient: 80%     Co-Pay: 20% Home Health: 100%      Co-Pay:  DME: 80%     Co-Pay: 20% Providers: pt choice  SECONDARY: A[employer]      Policy#: [id number]      Phone#:    Financial Counselor:       Phone#:    The "Data Collection Information Summary" for patients in Inpatient Rehabilitation Facilities with attached "Privacy Act Statement-Health Care Records" was provided and verbally reviewed with: Patient   Emergency Contact Information Contact Information       Name Relation Home Work Mobile    Kristopher Thompson Spouse [phone]   [phone]         Other Contacts   None on File        Current Medical History  Patient Admitting Diagnosis: Debility History of Present Illness: Kristopher Thompson is a 73 year old male with PMHx significant of HTN, chronic diastolic congestive heart failure, CAD, DM2, pulmonary embolism (on Eliquis ), anemia, obesity, prostate CA and bladder CA.  Patient was initially admitted by general surgery on 04/10/2024 for operative management regarding parastomal and incisional incarcerated abdominal wall hernias which required extensive lysis, mesh insertion, repair, urostomy ileal conduit revision and was admitted to ICU due to tachycardia and tachypnea.  Hospital was course complicated by perforated bowel and postop shock as well as AKI requiring HD.  He was stabilized and discharged to SNF on 7/11 but returned at the request of SNF MD  due to increased output of  ostomy therefore he was directly admitted to [facility] on 05/25/24.  General surgery consulted, no surgical intervention recommended. t appears that the patient's serum creatinine recently was around 2 at the time of the discharge.On presentation serum creatinine was 2.3 suggesting possible AKI.Treated with IV fluid.  Creatinine improved to 1.9. Pt. With possible infection of ileostomy site on CT scan.  Started on IV Zosyn  however evaluated by general surgery/Dr. Sheldon and per them, they do not think patient has any signs of infection and did not recommend antibiotics. He was seen by PT/OT and they recommended CIR to assist return to PLOF.    Patient's medical record from [facility] has been reviewed by the rehabilitation admission coordinator and physician.   Past Medical History      Past Medical History:  Diagnosis Date   At risk for sleep apnea      12-25-2017   STOP-BANG SCORE= 5   --- SENT TO PCP   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    moderate atypia - right low back   Atypical nevus 04/04/2007    moderate to marked - right upper back (wider shave)   Atypical nevus 04/04/2007    moderate atypia -  center chest (wider shave)   Atypical nevus 04/04/2007    slight atypia - right thigh   Atypical nevus 11/29/2011    mild atypia - center upper back   Atypical nevus 11/29/2011    mild atypia - center chest   Bacteremia due to Klebsiella pneumoniae 10/09/2017   Bladder cancer (HCC) dx 07/2017    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  s/p  cystectomy w/ ileal conduit urinary diversion   Candida infection     CHF (congestive heart failure) (HCC)     Colostomy in place [facility])      since 08-08-2017-- per pt 12-25-2017 reddness around stoma   Diabetes mellitus without complication (HCC)     GERD (gastroesophageal reflux disease)     H/O hiatal hernia     History of sepsis 09/2017    dx bacteremia due to klebsiella pneumoniae,  post op intraabdominal abscess    Prostate cancer [facility]) urologist-- dr renda    10-02-2012 s/p  prostatectomy-- Stage T1c   RBBB     Renal disorder      pt. denies   Sleep apnea      cpap   Squamous cell carcinoma of skin 05/22/2013    left cheek - CX3 + 5FU   Wears glasses            Has the patient had major surgery during 100 days prior to admission? Yes   Family History   family history includes Colon cancer in an other family member; Hypertension in his father; Lung cancer in his mother.   Current Medications  Current Medications    Current Facility-Administered Medications:    (feeding supplement) PROSource Plus liquid 30 mL, 30 mL, Oral, BID BM, Patel, Pranav M, MD, 30 mL at 05/27/24 0928   0.9 %  sodium chloride  infusion, , Intravenous, Continuous, Tobie Yetta HERO, MD, Last Rate: 125 mL/hr at 05/26/24 2135, Infusion Verify at 05/26/24 2135   acetaminophen  (TYLENOL ) tablet 500 mg, 500 mg, Oral, TID WC & HS, [name], MD, 500 mg at 05/27/24 1245   albuterol  (PROVENTIL ) (2.5 MG/3ML) 0.083% nebulizer solution 2.5 mg, 2.5 mg, Nebulization, Q2H PRN, Debby Camila LABOR, MD   apixaban  (ELIQUIS ) tablet 5 mg, 5 mg, Oral, BID, Patel, Pranav M, MD, 5 mg at 05/27/24 9071   feeding supplement (ENSURE PLUS HIGH PROTEIN) liquid 237 mL, 237 mL, Oral, BID BM, Patel, Pranav M, MD, 237 mL at 05/26/24 1342   hydrOXYzine  (ATARAX ) tablet 25 mg, 25 mg, Oral, TID PRN, Debby Camila LABOR, MD   insulin  aspart (novoLOG ) injection 0-5 Units, 0-5 Units, Subcutaneous, QHS, Debby Camila LABOR, MD   insulin  aspart (novoLOG ) injection 0-6 Units, 0-6 Units, Subcutaneous, TID WC, Debby Camila A, MD   lactated ringers  bolus 1,000 mL, 1,000 mL, Intravenous, Q8H PRN, [name], MD   loperamide  (IMODIUM ) capsule 2-4 mg, 2-4 mg, Oral, Q6H PRN, [name], MD   loperamide  (IMODIUM ) capsule 4 mg, 4 mg, Oral, BID, [name], MD, 4 mg at 05/27/24 9071   magic mouthwash, 15 mL, Oral, QID PRN, [name], MD    menthol -cetylpyridinium (CEPACOL) lozenge 3 mg, 1 lozenge, Oral, PRN, [name], MD   metoCLOPramide  (REGLAN ) tablet 5-10 mg, 5-10 mg, Oral, Q8H PRN, [name], MD   midodrine  (PROAMATINE ) tablet 5 mg, 5 mg, Oral, TID WC, Patel, Pranav M, MD, 5 mg at 05/27/24 1245   morphine  (PF) 2 MG/ML injection 2 mg, 2 mg, Intravenous, Once, Debby Camila LABOR, MD   naphazoline-glycerin  (  CLEAR EYES REDNESS) ophth solution 1-2 drop, 1-2 drop, Both Eyes, QID PRN, [name], MD   nutrition supplement (JUVEN) (JUVEN) powder packet 1 packet, 1 packet, Oral, BID BM, Patel, Pranav M, MD, 1 packet at 05/27/24 9071   ondansetron  (ZOFRAN ) injection 4 mg, 4 mg, Intravenous, Q6H PRN, Howerter, Justin B, DO, 4 mg at 05/27/24 0617   oxyCODONE -acetaminophen  (PERCOCET/ROXICET) 5-325 MG per tablet 1-2 tablet, 1-2 tablet, Oral, Q6H PRN, [name], MD   phenol (CHLORASEPTIC) mouth spray 2 spray, 2 spray, Mouth/Throat, PRN, [name], MD   piperacillin -tazobactam (ZOSYN ) IVPB 3.375 g, 3.375 g, Intravenous, Q8H, Hammons, Kimberly B, RPH, Last Rate: 12.5 mL/hr at 05/27/24 0513, 3.375 g at 05/27/24 0513   polycarbophil (FIBERCON) tablet 625 mg, 625 mg, Oral, BID, Debby Hitch A, MD, 625 mg at 05/27/24 9071   rosuvastatin  (CRESTOR ) tablet 10 mg, 10 mg, Oral, QPM, Debby Hitch A, MD, 10 mg at 05/26/24 1747   sodium chloride  (OCEAN) 0.65 % nasal spray 1-2 spray, 1-2 spray, Each Nare, Q6H PRN, [name], MD   sodium chloride  flush (NS) 0.9 % injection 3 mL, 3 mL, Intravenous, Q12H, Debby Hitch LABOR, MD, 3 mL at 05/27/24 0936     Patients Current Diet:  Diet Order                  Diet heart healthy/carb modified Room service appropriate? Yes; Fluid consistency: Thin  Diet effective now                         Precautions / Restrictions Precautions Precautions: Fall Precaution/Restrictions Comments: urostomy, abd wound vac, bil perc nephrostomy drains Restrictions Weight Bearing  Restrictions Per Provider Order: No    Has the patient had 2 or more falls or a fall with injury in the past year? Yes   Prior Activity Level Community (5-7x/wk): Pt. active in the community PTA   Prior Functional Level Self Care: Did the patient need help bathing, dressing, using the toilet or eating? Independent   Indoor Mobility: Did the patient need assistance with walking from room to room (with or without device)? Independent   Stairs: Did the patient need assistance with internal or external stairs (with or without device)? Independent   Functional Cognition: Did the patient need help planning regular tasks such as shopping or remembering to take medications? Independent   Patient Information Are you of Hispanic, Latino/a,or Spanish origin?: A. No, not of Hispanic, Latino/a, or Spanish origin What is your race?: A. White Do you need or want an interpreter to communicate with a doctor or health care staff?: 0. No   Patient's Response To:  Health Literacy and Transportation Is the patient able to respond to health literacy and transportation needs?: Yes Health Literacy - How often do you need to have someone help you when you read instructions, pamphlets, or other written material from your doctor or pharmacy?: Never In the past 12 months, has lack of transportation kept you from medical appointments or from getting medications?: No In the past 12 months, has lack of transportation kept you from meetings, work, or from getting things needed for daily living?: No   Home Assistive Devices / Equipment Home Equipment: Rollator (4 wheels)   Prior Device Use: Indicate devices/aids used by the patient prior to current illness, exacerbation or injury? None of the above   Current Functional Level Cognition   Orientation Level: Oriented X4    Extremity Assessment (includes Sensation/Coordination)  Upper Extremity Assessment: Defer to OT evaluation  Lower Extremity Assessment:  Generalized weakness     ADLs   Overall ADL's : Needs assistance/impaired Eating/Feeding: Set up, Sitting Grooming: Set up, Sitting Upper Body Bathing: Minimal assistance, Sitting Lower Body Bathing: Total assistance, Sit to/from stand, +2 for safety/equipment Upper Body Dressing : Minimal assistance, Sitting Lower Body Dressing: Total assistance, Sit to/from stand, +2 for safety/equipment Toilet Transfer: Minimal assistance, +2 for safety/equipment, Rolling walker (2 wheels) Toileting- Clothing Manipulation and Hygiene: Total assistance, +2 for safety/equipment, Sit to/from stand     Mobility   Overal bed mobility: Needs Assistance Bed Mobility: Supine to Sit Supine to sit: Mod assist, HOB elevated, Used rails General bed mobility comments: increased time, assist to for hips to EOB and to raise trunk     Transfers   Overall transfer level: Needs assistance Equipment used: Rolling walker (2 wheels) Transfers: Sit to/from Stand, Bed to chair/wheelchair/BSC Sit to Stand: Min assist, +2 safety/equipment Bed to/from chair/wheelchair/BSC transfer type:: Step pivot Step pivot transfers: Min assist, +2 safety/equipment General transfer comment: assist to rise and steady, pt immediately requesting to step to chair, appeared fatigued after transfer     Ambulation / Gait / Stairs / Clinical biochemist / Balance Balance Overall balance assessment: Needs assistance Sitting-balance support: Feet supported Sitting balance-Leahy Scale: Fair Standing balance support: Reliant on assistive device for balance, Bilateral upper extremity supported Standing balance-Leahy Scale: Poor Standing balance comment: reliant on RW and up to minA for balance     Special needs/care consideration Special service needs ostomy, wound vac, nephrostomy tube    Previous Home Environment (from acute therapy documentation) Living Arrangements: Spouse/significant other Available Help at Discharge:  Family, Available 24 hours/day Type of Home: House Home Layout: One level Home Access: Stairs to enter [employer] of Steps: 1 Bathroom Shower/Tub: Engineer, manufacturing systems: Standard Home Care Services: No   Discharge Living Setting Plans for Discharge Living Setting: Patient's home Type of Home at Discharge: House Discharge Home Layout: One level Discharge Home Access: Stairs to enter Entrance Stairs-Rails: None Entrance Stairs-Number of Steps: 1 Discharge Bathroom Shower/Tub: Tub/shower unit Discharge Bathroom Toilet: Standard Discharge Bathroom Accessibility: No Does the patient have any problems obtaining your medications?: No   Social/Family/Support Systems Patient Roles: Spouse Contact Information: [phone] Anticipated Caregiver: Fenris Cauble Ability/Limitations of Caregiver: Wife Min A Caregiver Availability: 24/7 Discharge Plan Discussed with Primary Caregiver: Yes Is Caregiver In Agreement with Plan?: Yes Does Caregiver/Family have Issues with Lodging/Transportation while Pt is in Rehab?: Yes   Goals Patient/Family Goal for Rehab: PT/OT Min A Expected length of stay: 12-14 days Pt/Family Agrees to Admission and willing to participate: Yes Program Orientation Provided & Reviewed with Pt/Caregiver Including Roles  & Responsibilities: Yes   Decrease burden of Care through IP rehab admission: not anticipated   Possible need for SNF placement upon discharge: not anticipated   Patient Condition: I have reviewed medical records from [facility] , spoken with CM, and patient. I met with patient at the bedside for inpatient rehabilitation assessment.  Patient will benefit from ongoing PT and OT, can actively participate in 3 hours of therapy a day 5 days of the week, and can make measurable gains during the admission.  Patient will also benefit from the coordinated team approach during an Inpatient Acute Rehabilitation admission.  The  patient will receive intensive therapy as well as Rehabilitation physician, nursing, social worker, and care  management interventions.  Due to safety, skin/wound care, disease management, medication administration, pain management, and patient education the patient requires 24 hour a day rehabilitation nursing.  The patient is currently min A- max A  with mobility and basic ADLs.  Discharge setting and therapy post discharge at home with home health is anticipated.  Patient has agreed to participate in the Acute Inpatient Rehabilitation Program and will admit today.   Preadmission Screen Completed By:  [name] KATHEE Kleine, 05/27/2024 12:56 PM ______________________________________________________________________   Discussed status with Dr. Lorilee on 05/27/24 at 900 and received approval for admission today.   Admission Coordinator:  [name] KATHEE Kleine, CCC-SLP, time 1300/Date 05/27/24    Assessment/Plan: Diagnosis: Debility Does the need for close, 24 hr/day Medical supervision in concert with the patient's rehab needs make it unreasonable for this patient to be served in a less intensive setting? Yes Co-Morbidities requiring supervision/potential complications:  AKI on CKD 3b Type 2 DM HLD Obesity S/p ileal conduit Due to bladder management, bowel management, safety, skin/wound care, disease management, medication administration, pain management, and patient education, does the patient require 24 hr/day rehab nursing? Yes Does the patient require coordinated care of a physician, rehab nurse, PT, OT to address physical and functional deficits in the context of the above medical diagnosis(es)? Yes Addressing deficits in the following areas: balance, endurance, locomotion, strength, transferring, bowel/bladder control, bathing, dressing, feeding, grooming, toileting, and psychosocial support Can the patient actively participate in an intensive therapy program of at least 3 hrs of therapy 5 days a week?  Yes The potential for patient to make measurable gains while on inpatient rehab is excellent Anticipated functional outcomes upon discharge from inpatient rehab: modified independent PT, modified independent OT, independent SLP Estimated rehab length of stay to reach the above functional goals is: 10-14 days Anticipated discharge destination: Home 10. Overall Rehab/Functional Prognosis: excellent     MD Signature: Sven Lorilee, MD

## 2024-05-27 NOTE — Progress Notes (Signed)
 [facility] night cross cover note:   I was notified by the patient's RN that the telemetry monitor had conveyed concern for ST elevation on telemetry.   RN conveys that the patient is completely asymptomatic at this time, with stable, unchanged vital signs.  Stat EKG was obtained, and, in comparison to most recent prior EKG from 05/09/2024, shows sinus rhythm with right bundle branch block, heart rate 91, no evidence of interval T wave changes, while showing less than 1 mm ST elevation in V3 and V4, which appears unchanged from most recent prior EKG, while showing no evidence of additional ST changes.  Overall, this updated EKG appears reassuring, demonstrating sinus rhythm without evidence of acute ischemic changes, including no evidence of STEMI.   Eva Pore, DO Hospitalist

## 2024-05-27 NOTE — Progress Notes (Signed)
 [place] rumors that he may be coming back in town.  Checked chart and note patient has been readmitted.  Looks like patient was having stomatitis around his end ileostomy that cannot be managed there.  I see he has already been seen by wound ostomy nurses with appliance changes and wound VAC changes with no major concerns.  Patient should be on loperamide  and FiberCon twice daily.  Will increase from 2 mg to 4 mg twice daily.  Still with significant chronic kidney disease but no major concern.  Will send off abdominal wall fluid drain for creatinine to rule out any recurrent leak or other concerns.  I do not see anything that warrants acute surgical intervention   I will see tomorrow.

## 2024-05-28 DIAGNOSIS — K631 Perforation of intestine (nontraumatic): Secondary | ICD-10-CM

## 2024-05-28 DIAGNOSIS — R5381 Other malaise: Secondary | ICD-10-CM | POA: Diagnosis not present

## 2024-05-28 LAB — VITAMIN D 25 HYDROXY (VIT D DEFICIENCY, FRACTURES): Vit D, 25-Hydroxy: 41.22 ng/mL (ref 30–100)

## 2024-05-28 LAB — COMPREHENSIVE METABOLIC PANEL WITH GFR
ALT: 48 U/L — ABNORMAL HIGH (ref 0–44)
AST: 29 U/L (ref 15–41)
Albumin: 2.6 g/dL — ABNORMAL LOW (ref 3.5–5.0)
Alkaline Phosphatase: 74 U/L (ref 38–126)
Anion gap: 9 (ref 5–15)
BUN: 73 mg/dL — ABNORMAL HIGH (ref 8–23)
CO2: 16 mmol/L — ABNORMAL LOW (ref 22–32)
Calcium: 9 mg/dL (ref 8.9–10.3)
Chloride: 103 mmol/L (ref 98–111)
Creatinine, Ser: 2.05 mg/dL — ABNORMAL HIGH (ref 0.61–1.24)
GFR, Estimated: 34 mL/min — ABNORMAL LOW (ref >60)
Glucose, Bld: 92 mg/dL (ref 70–99)
Potassium: 3.9 mmol/L (ref 3.5–5.1)
Sodium: 128 mmol/L — ABNORMAL LOW (ref 135–145)
Total Bilirubin: 0.5 mg/dL (ref 0.0–1.2)
Total Protein: 6 g/dL — ABNORMAL LOW (ref 6.5–8.1)

## 2024-05-28 LAB — GLUCOSE, CAPILLARY
Glucose-Capillary: 100 mg/dL — ABNORMAL HIGH (ref 70–99)
Glucose-Capillary: 125 mg/dL — ABNORMAL HIGH (ref 70–99)
Glucose-Capillary: 99 mg/dL (ref 70–99)
Glucose-Capillary: 84 mg/dL (ref 70–99)

## 2024-05-28 NOTE — Progress Notes (Signed)
 [facility]patient [facility] Individual Statement of Services  Patient Name:  Kristopher Thompson  Date:  05/28/2024  Welcome to the [facility].  Our goal is to provide you with an individualized program based on your diagnosis and situation, designed to meet your specific needs.  With this comprehensive rehabilitation program, you will be expected to participate in at least 3 hours of rehabilitation therapies Monday-Friday, with modified therapy programming on the weekends.  Your rehabilitation program will include the following services:  Physical Therapy (PT), Occupational Therapy (OT), Speech Therapy (ST), 24 hour per day rehabilitation nursing, Therapeutic Recreaction (TR), Neuropsychology, Care Coordinator, Rehabilitation Medicine, Nutrition Services, and Pharmacy Services  Weekly team conferences will be held on Tuesday to discuss your progress.  Your Inpatient Rehabilitation Care Coordinator will talk with you frequently to get your input and to update you on team discussions.  Team conferences with you and your family in attendance may also be held.  Expected length of stay: 10-14 days  Overall anticipated outcome: supervision with cues  Depending on your progress and recovery, your program may change. Your Inpatient Rehabilitation Care Coordinator will coordinate services and will keep you informed of any changes. Your Inpatient Rehabilitation Care Coordinator's name and contact numbers are listed  below.  The following services may also be recommended but are not provided by the Inpatient Rehabilitation Center:  Driving Evaluations Home Health Rehabiltiation Services Outpatient Rehabilitation Services    Arrangements will be made to provide these services after discharge if needed.  Arrangements include referral to agencies that provide these services.  Your insurance has been verified to be:  medicare & AARP Your primary doctor is:  Tanda Rummer  Pertinent information will be shared with your doctor and your insurance company.  Inpatient Rehabilitation Care Coordinator:  Rhoda Clement, KEN [phone] or ELIGAH BASQUES  Information discussed with and copy given to patient by: Clement Asberry MATSU, 05/28/2024, 9:56 AM

## 2024-05-28 NOTE — Progress Notes (Signed)
 Physical Therapy Assessment and Plan  Patient Details  Name: Kristopher Thompson MRN: [id number] Date of Birth: 02-28-1951  PT Diagnosis: Abnormal posture, Abnormality of gait, Difficulty walking, Dizziness and giddiness, and Muscle weakness Rehab Potential: Good ELOS: 7-10 days   Today's Date: 05/28/2024 PT Individual Time: 830-945       Hospital Problem: Principal Problem:   Debility   Past Medical History:  Past Medical History:  Diagnosis Date   At risk for sleep apnea    12-25-2017   STOP-BANG SCORE= 5   --- SENT TO PCP   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   moderate atypia - right low back   Atypical nevus 04/04/2007   moderate to marked - right upper back (wider shave)   Atypical nevus 04/04/2007   moderate atypia - center chest (wider shave)   Atypical nevus 04/04/2007   slight atypia - right thigh   Atypical nevus 11/29/2011   mild atypia - center upper back   Atypical nevus 11/29/2011   mild atypia - center chest   Bacteremia due to Klebsiella pneumoniae 10/09/2017   Bladder cancer (HCC) dx 07/2017   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  s/p  cystectomy w/ ileal conduit urinary diversion   Candida infection    CHF (congestive heart failure) (HCC)    Colostomy in place (HCC)    since 08-08-2017-- per pt 12-25-2017 reddness around stoma   Diabetes mellitus without complication (HCC)    GERD (gastroesophageal reflux disease)    H/O hiatal hernia    History of sepsis 09/2017   dx bacteremia due to klebsiella pneumoniae,  post op intraabdominal abscess   Prostate cancer [facility]) urologist-- dr renda   10-02-2012 s/p  prostatectomy-- Stage T1c   RBBB    Renal disorder    pt. denies   Sleep apnea    cpap   Squamous cell carcinoma of skin 05/22/2013   left cheek - CX3 + 5FU   Wears glasses    Past Surgical History:  Past Surgical History:  Procedure Laterality Date   ABDOMINAL SURGERY     APPENDECTOMY  1972   BOWEL RESECTION N/A 04/20/2024   Procedure: SMALL  BOWEL RESECTION;  Surgeon: [name], MD;  Location: [facility];  Service: General;  Laterality: N/A;   CHOLECYSTECTOMY  1985   COLONOSCOPY N/A 06/29/2021   Procedure: COLONOSCOPY;  Surgeon: Debby Hila, MD;  Location: [facility];  Service: Endoscopy;  Laterality: N/A;   COLOSTOMY REVERSAL N/A 01/08/2018   Procedure: COLOSTOMY REVERSAL;  Surgeon: Debby Hila, MD;  Location: [facility];  Service: General;  Laterality: N/A;   CYSTOSCOPY WITH RETROGRADE PYELOGRAM, URETEROSCOPY AND STENT PLACEMENT Right 06/10/2017   Procedure: CYSTOSCOPY WITH RIGHT URETEROSCOPY WITH RIGHT STENT PLACEMENT;  Surgeon: [name], MD;  Location: [facility];  Service: Urology;  Laterality: Right;   EUS N/A 04/16/2018   Procedure: FULL UPPER ENDOSCOPIC ULTRASOUND (EUS) RADIAL;  Surgeon: Burnette Fallow, MD;  [facility];  Service: Endoscopy;  Laterality: N/A;   FLEXIBLE SIGMOIDOSCOPY N/A 12/13/2017   Procedure: FLEXIBLE SIGMOIDOSCOPY;  Surgeon: Debby Hila, MD;  [facility];  Service: Endoscopy;  Laterality: N/A;   ILEO CONDUIT     IR CATHETER TUBE CHANGE  12/13/2017   IR CATHETER TUBE CHANGE  01/17/2018   IR CATHETER TUBE CHANGE  02/28/2018   IR CATHETER TUBE CHANGE  07/18/2018   IR CATHETER TUBE CHANGE  08/22/2018   IR CATHETER TUBE CHANGE  10/31/2018   IR CONVERT LEFT NEPHROSTOMY TO NEPHROURETERAL CATH  10/24/2017   IR EXT NEPHROURETERAL CATH EXCHANGE  05/19/2018   IR EXT NEPHROURETERAL CATH EXCHANGE  06/16/2018   IR NEPHRO TUBE REMOV/FL  10/24/2017   IR NEPHROSTOGRAM LEFT THRU EXISTING ACCESS  12/05/2018   IR NEPHROSTOMY EXCHANGE LEFT  05/11/2024   IR NEPHROSTOMY PLACEMENT LEFT  10/07/2017   IR NEPHROSTOMY PLACEMENT RIGHT  05/04/2024   IR NEPHROSTOMY TUBE CHANGE  04/11/2018   IR URETERAL STENT PLACEMENT EXISTING ACCESS LEFT  05/13/2024   IR URETERAL STENT RIGHT NEW ACCESS W/SEP NEPHROSTOMY CATH  05/13/2024   LAPAROTOMY N/A 04/20/2024   Procedure: EXPLORATORY LAPAROTOMY;  Surgeon: [name], MD;  Location:  [facility];  Service: General;  Laterality: N/A;   PARTIAL COLECTOMY N/A 04/21/2024   Procedure: COLECTOMY, PARTIAL;  Surgeon: [name], MD;  Location: [facility];  Service: General;  Laterality: N/A;  Removal Wound Vac, Washout Ostomy, Possible Anastomosis, Possible Ileostomy.  Phasix Mesh.   POLYPECTOMY  06/29/2021   Procedure: POLYPECTOMY;  Surgeon: Debby Hila, MD;  [facility];  Service: Endoscopy;;   ROBOT ASSISTED LAPAROSCOPIC RADICAL PROSTATECTOMY  10/02/2012   Procedure: ROBOTIC ASSISTED LAPAROSCOPIC RADICAL PROSTATECTOMY LEVEL 2;  Surgeon: Noretta Ferrara, MD;  Location: [facility];  Service: Urology;  Laterality: N/A;   ROBOTIC ASSISTED LAPAROSCOPIC BLADDER DIVERTICULECTOMY N/A 08/08/2017   Procedure: XI ROBOTIC ASSISTED LAPAROSCOPIC RADICAL CYSTECTOMY COVERTED TO OPEN PELVIC LYMPHADNECTOMY BILATERAL AND ILEAL CONDUIT URINARY DIVERSION;  Surgeon: Ferrara Glance, MD;  Location: [facility];  Service: Urology;  Laterality: N/A;   TRANSURETHRAL RESECTION OF BLADDER TUMOR  06/10/2017   Procedure: TRANSURETHRAL RESECTION OF BLADDER TUMOR (TURBT);  Surgeon: Ferrara Glance, MD;  [facility];  Service: Urology;;   VACUUM ASSISTED CLOSURE CHANGE N/A 04/20/2024   Procedure: PLACEMENT OF CONCETTA CAPES;  Surgeon: [name], MD;  Location: [facility];  Service: General;  Laterality: N/A;   VENTRAL HERNIA REPAIR N/A 04/10/2024   Procedure: REPAIR, HERNIA, VENTRAL;  Surgeon: [name], MD;  [facility];  Service: General;  Laterality: N/A;   XI ROBOT ABDOMINAL PERINEAL RESECTION N/A 08/08/2017   Procedure: REPAIR OF RECTAL TEAR POSSIBLE PARTIAL PROCTECTOMY, CREATION OF  OSTOMY;  Surgeon: Debby Hila, MD;  [facility];  Service: General;  Laterality: N/A;   XI ROBOTIC ASSISTED PARASTOMAL HERNIA REPAIR N/A 04/10/2024   Procedure: REPAIR, HERNIA, PARASTOMAL AND VENTRAL HERNIAS, ROBOT-ASSISTED, LEFT INGUINAL HERNIA, LYSIS OF ADHESIONS INCARCERATED AND INSIONAL HERNIAS AND UROSTOMY REVISION;   Surgeon: [name], MD;  Location: [facility];  Service: General;  Laterality: N/A;    Assessment & Plan Clinical Impression: Patient is a 73 year old male with PMHx significant of HTN, chronic diastolic congestive heart failure, CAD, DM2, pulmonary embolism (on Eliquis ), anemia, obesity, prostate CA and bladder CA. Patient was initially admitted by general surgery on 04/10/2024 for operative management regarding parastomal and incisional incarcerated abdominal wall hernias which required extensive lysis, mesh insertion, repair, urostomy ileal conduit revision and was admitted to ICU due to tachycardia and tachypnea. Hospital was course complicated by perforated bowel and postop shock as well as AKI requiring HD. He was stabilized and discharged to SNF on 7/11 but returned at the request due to increased output of ostomy therefore he was directly admitted. Urology consulted for urostomy evaluation and continue to follow. General surgery consulted and recommending continuing wound VAC and bowel regimen for ileostomy. Admission labs creatinine 2.3. DVT prophylaxis Eliquis .  Patient transferred to CIR on 05/27/2024 .    Patient currently requires mod with mobility secondary to  muscle weakness and decreased cardiorespiratoy endurance.  Prior to hospitalization, patient was independent  with mobility and lived with Spouse in a House home.  Home access is 2Stairs to enter.  Patient will benefit from skilled PT intervention to maximize safe functional mobility, minimize fall risk, and decrease caregiver burden for planned discharge home with 24 hour supervision.  Anticipate patient will benefit from follow up HH at discharge.  PT - End of Session Activity Tolerance: Decreased this session Endurance Deficit: Yes PT Assessment Rehab Potential (ACUTE/IP ONLY): Good PT Patient demonstrates impairments in the following area(s): Balance;Endurance;Motor;Pain;Safety;Skin Integrity PT Transfers Functional Problem(s):  Bed Mobility;Bed to Chair;Car PT Locomotion Functional Problem(s): Ambulation;Wheelchair Mobility;Stairs PT Plan PT Intensity: Minimum of 1-2 x/day ,45 to 90 minutes PT Frequency: 5 out of 7 days PT Duration Estimated Length of Stay: 7-10 days PT Treatment/Interventions: Ambulation/gait training;Discharge planning;Functional mobility training;Therapeutic Activities;Balance/vestibular training;Disease management/prevention;Neuromuscular re-education;Skin care/wound management;Therapeutic Exercise;Wheelchair propulsion/positioning;DME/adaptive equipment instruction;Pain management;UE/LE Strength taining/ROM;Community reintegration;Patient/family education;Stair training;UE/LE Coordination activities PT Transfers Anticipated Outcome(s): supervision PT Locomotion Anticipated Outcome(s): supervision with LRAD PT Recommendation Recommendations for Other Services: Therapeutic Recreation consult Therapeutic Recreation Interventions: Stress management Follow Up Recommendations: Home health PT Patient destination: Home Equipment Recommended: To be determined   PT Evaluation Precautions/Restrictions Precautions Precautions: Fall Recall of Precautions/Restrictions: Intact Precaution/Restrictions Comments: Urostomy/Colostomy, Ab wound vac, B nephrostomy drains Restrictions Weight Bearing Restrictions Per Provider Order: No Pain Pain Assessment Pain Scale: 0-10 Pain Score: 7  Pain Location: Abdomen Pain Intervention(s): Medication (See eMAR) Pain Interference Pain Interference Pain Effect on Sleep: 1. Rarely or not at all Pain Interference with Therapy Activities: 2. Occasionally Pain Interference with Day-to-Day Activities: 3. Frequently Home Living/Prior Functioning Home Living Available Help at Discharge: Family;Available 24 hours/day Type of Home: House Home Access: Stairs to enter [employer] of Steps: 2 Entrance Stairs-Rails: Can reach both Home Layout: Two level;Able to  live on main level with bedroom/bathroom  Lives With: Spouse Prior Function Level of Independence: Independent with basic ADLs;Independent with homemaking with ambulation;Independent with gait;Independent with transfers  Able to Take Stairs?: Yes Driving: Yes Vocation: Retired Leisure: Hobbies-yes (Comment) (gardening) Vision/Perception  Vision - History Ability to See in Adequate Light: 0 Adequate Perception Perception: Within Functional Limits Praxis Praxis: WFL  Cognition Overall Cognitive Status: Within Functional Limits for tasks assessed Orientation Level: Oriented X4 Awareness: Appears intact Problem Solving: Appears intact Safety/Judgment: Appears intact Sensation Sensation Light Touch: Impaired Detail Light Touch Impaired Details: Impaired LLE;Impaired RLE Hot/Cold: Appears Intact Proprioception: Appears Intact Stereognosis: Not tested Coordination Gross Motor Movements are Fluid and Coordinated: No Fine Motor Movements are Fluid and Coordinated: Yes Coordination and Movement Description: Deficits due to generalized weakness/debility and pain. Motor  Motor Motor: Other (comment) Motor - Skilled Clinical Observations: Deficits due to generalized weakness/debility and pain.   Trunk/Postural Assessment  Cervical Assessment Cervical Assessment: Exceptions to [facility] Thoracic Assessment Thoracic Assessment: Exceptions to [facility] Lumbar Assessment Lumbar Assessment: Exceptions to [facility]: Lambert Rhonda W Postural Control Postural Control: Deficits on evaluation Righting Reactions: Decreased/delayed Protective Responses: Decreased/delayed  Balance Balance Balance Assessed: Yes Static Sitting Balance Static Sitting - Balance Support: Feet supported Static Sitting - Level of Assistance: 4: Min assist Static Sitting - Comment/# of Minutes: decreased trunk control at times Dynamic Sitting Balance Sitting balance - Comments: able to static sit EOB without UB support, requires trunk  support Static Standing Balance Static Standing - Balance Support: During functional activity;Bilateral upper extremity supported Static Standing - Level of Assistance: 4: Min assist Extremity Assessment      RLE  Assessment RLE Assessment: Exceptions to [facility] General Strength Comments: grossly 4-/5 LLE Assessment LLE Assessment: Exceptions to [facility] General Strength Comments: grossly 4-5  Care Tool Care Tool Bed Mobility Roll left and right activity   Roll left and right assist level: Contact Guard/Touching assist    Sit to lying activity   Sit to lying assist level: Moderate Assistance - Patient 50 - 74% (for LE placement)    Lying to sitting on side of bed activity   Lying to sitting on side of bed assist level: the ability to move from lying on the back to sitting on the side of the bed with no back support.: Moderate Assistance - Patient 50 - 74%     Care Tool Transfers Sit to stand transfer   Sit to stand assist level: Moderate Assistance - Patient 50 - 74%    Chair/bed transfer   Chair/bed transfer assist level: Minimal Assistance - Patient > 75%    Car transfer Car transfer activity did not occur: Safety/medical concerns (pt too weak, fatigue)        Care Tool Locomotion Ambulation   Assist level: Minimal Assistance - Patient > 75% (x2 ppl) Assistive device: Walker-rolling Max distance: ~3'  Walk 10 feet activity Walk 10 feet activity did not occur: Safety/medical concerns (pt too weak, fatigue)   Assistive device: Walker-rolling   Walk 50 feet with 2 turns activity Walk 50 feet with 2 turns activity did not occur: Safety/medical concerns (pt too weak, fatigue)      Walk 150 feet activity Walk 150 feet activity did not occur: Safety/medical concerns (pt too weak, fatigue)      Walk 10 feet on uneven surfaces activity Walk 10 feet on uneven surfaces activity did not occur: Safety/medical concerns (pt too weak, fatigue)      Stairs Stair activity did not occur:  Safety/medical concerns (pt too weak, fatigue)        Walk up/down 1 step activity Walk up/down 1 step or curb (drop down) activity did not occur: Safety/medical concerns (pt too weak, fatigue)      Walk up/down 4 steps activity Walk up/down 4 steps activity did not occur: Safety/medical concerns (pt too weak, fatigue)      Walk up/down 12 steps activity Walk up/down 12 steps activity did not occur: Safety/medical concerns (pt too weak, fatigue)      Pick up small objects from floor Pick up small object from the floor (from standing position) activity did not occur: Safety/medical concerns (pt too weak, fatigue)      Wheelchair Is the patient using a wheelchair?: Yes Type of Wheelchair: Manual   Wheelchair assist level: Dependent - Patient 0%    Wheel 50 feet with 2 turns activity Wheelchair 50 feet with 2 turns activity did not occur: Safety/medical concerns (pt too weak, fatigue)    Wheel 150 feet activity Wheelchair 150 feet activity did not occur: Safety/medical concerns (pt too weak, fatigue)      Refer to Care Plan for Long Term Goals  SHORT TERM GOAL WEEK 1 PT Short Term Goal 1 (Week 1): = LTG  Recommendations for other services: Therapeutic Recreation  Stress management   Skilled Therapeutic Intervention Mobility Bed Mobility Bed Mobility: Rolling Right;Rolling Left;Right Sidelying to Sit;Supine to Sit;Sit to Supine;Scooting to [facility] Rolling Right: Contact Guard/Touching assist Rolling Left: Contact Guard/Touching assist Right Sidelying to Sit: Moderate Assistance - Patient 50-74% Supine to Sit: Moderate Assistance - Patient 50-74% Sit to Supine: Moderate Assistance - Patient 50-74%  Scooting to [facility]: Dependent - Patient equal 0% Transfers Transfers: Sit to Stand;Stand to Sit Sit to Stand: Moderate Assistance - Patient 50-74% (x 2) Stand to Sit: Minimal Assistance - Patient > 75% Transfer (Assistive device): Rolling walker Locomotion  Gait Ambulation: Yes Gait  Assistance: Minimal Assistance - Patient > 75% Gait Distance (Feet): 3 Feet Assistive device: Rolling walker Gait Assistance Details: Verbal cues for precautions/safety;Verbal cues for safe use of DME/AE;Verbal cues for technique Gait Gait: Yes Gait Pattern: Impaired Gait Pattern: Step-to pattern;Decreased stride length;Shuffle;Trunk flexed;Narrow base of support Gait velocity: decr Stairs / Additional Locomotion Stairs: No (pt too weak, fatigue) Ramp:  (pt too weak, fatigue to attempt) Curb:  (pt too weak, fatigue to attempt) Naval [employer] Mobility: No  Evaluation completed with education PT POC and goals with pt and wife, individual treatment initiated with focus on bed mobility, balance, transfers, WC mobility, and ambulation.  Pt in bed when PT arrived, pt agreeable for PT session as soon as his bags are emptied.  Wife present during visit. PT notified nsg of pt's request.  Pt has wound vac, drains x3, IV. Pt performed LE supine hip abd/add, heel slides, then RN came to empty bags. Pt able to transfer from supine->sit with mod assist x2. Pt sat at EOB pt c/o feeling dizzy, nauseous initially, slowly resolved during tx. RN took BP. Pt c/o pain to right lateral distal trunk-drain location with movement.  Pt describes as sharp, stinging pain.  PT and RN donned pts TED hose bilateral LE.   Pt performed sit to stand to RW  x3 with mod assist x2 due to weakness, cues for hand placement and positioning needed. Standing with RW x30 max tolerance. Pt c/o fatigue and unable to continue with PT requests after activity. Pt transferred back to bed with mod assist x2; all needs within reach.   Session 2: Pt in bed appearing to look more alert.  Pt agreeable for PT.  Pt wheeled to day gym x2 for IV line/wound vac assist.  Pt peformed sit<->stand from w/c<->RW with xmin/mod assist x2. Pt stood for approximately 20 before sitting back down, min assist. Cueing for hand placement, ant wt  shifting needed. Pt stood again and able to attempt to adjust shorts draw string without RW support x10.  Pt ambulated ~3' forwards/backwards with min a x2 for safety. Steps are short, shuffled with narrow BOS, forward trunk. Pt unable to continue due to fatigue. Pt returned back to room, transferred into bed mod assist x2 and positioned towards [facility] x2 dependent. Pt comfortable with pillow under right trunk to decrease pressure of wound drains. All items within reach, wife with patient.   Discharge Criteria: Patient will be discharged from PT if patient refuses treatment 3 consecutive times without medical reason, if treatment goals not met, if there is a change in medical status, if patient makes no progress towards goals or if patient is discharged from hospital.  The above assessment, treatment plan, treatment alternatives and goals were discussed and mutually agreed upon: by patient  [name] Fast 05/28/2024, 4:45 PM

## 2024-05-28 NOTE — Progress Notes (Signed)
 Inpatient Rehabilitation Care Coordinator Assessment and Plan Patient Details  Name: Kristopher Thompson MRN: [id number] Date of Birth: 1951/07/07  Today's Date: 05/28/2024  Hospital Problems: Principal Problem:   Debility  Past Medical History:  Past Medical History:  Diagnosis Date   At risk for sleep apnea    12-25-2017   STOP-BANG SCORE= 5   --- SENT TO PCP   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   moderate atypia - right low back   Atypical nevus 04/04/2007   moderate to marked - right upper back (wider shave)   Atypical nevus 04/04/2007   moderate atypia - center chest (wider shave)   Atypical nevus 04/04/2007   slight atypia - right thigh   Atypical nevus 11/29/2011   mild atypia - center upper back   Atypical nevus 11/29/2011   mild atypia - center chest   Bacteremia due to Klebsiella pneumoniae 10/09/2017   Bladder cancer (HCC) dx 07/2017   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  s/p  cystectomy w/ ileal conduit urinary diversion   Candida infection    CHF (congestive heart failure) (HCC)    Colostomy in place (HCC)    since 08-08-2017-- per pt 12-25-2017 reddness around stoma   Diabetes mellitus without complication (HCC)    GERD (gastroesophageal reflux disease)    H/O hiatal hernia    History of sepsis 09/2017   dx bacteremia due to klebsiella pneumoniae,  post op intraabdominal abscess   Prostate cancer [facility]) urologist-- dr renda   10-02-2012 s/p  prostatectomy-- Stage T1c   RBBB    Renal disorder    pt. denies   Sleep apnea    cpap   Squamous cell carcinoma of skin 05/22/2013   left cheek - CX3 + 5FU   Wears glasses    Past Surgical History:  Past Surgical History:  Procedure Laterality Date   ABDOMINAL SURGERY     APPENDECTOMY  1972   BOWEL RESECTION N/A 04/20/2024   Procedure: SMALL BOWEL RESECTION;  Surgeon: [name], MD;  Location: [facility];  Service: General;  Laterality: N/A;   CHOLECYSTECTOMY  1985   COLONOSCOPY N/A 06/29/2021   Procedure:  COLONOSCOPY;  Surgeon: Debby Hila, MD;  Location: [facility];  Service: Endoscopy;  Laterality: N/A;   COLOSTOMY REVERSAL N/A 01/08/2018   Procedure: COLOSTOMY REVERSAL;  Surgeon: Debby Hila, MD;  Location: [facility];  Service: General;  Laterality: N/A;   CYSTOSCOPY WITH RETROGRADE PYELOGRAM, URETEROSCOPY AND STENT PLACEMENT Right 06/10/2017   Procedure: CYSTOSCOPY WITH RIGHT URETEROSCOPY WITH RIGHT STENT PLACEMENT;  Surgeon: [name], MD;  Location: [facility];  Service: Urology;  Laterality: Right;   EUS N/A 04/16/2018   Procedure: FULL UPPER ENDOSCOPIC ULTRASOUND (EUS) RADIAL;  Surgeon: Burnette Fallow, MD;  Location: [facility];  Service: Endoscopy;  Laterality: N/A;   FLEXIBLE SIGMOIDOSCOPY N/A 12/13/2017   Procedure: FLEXIBLE SIGMOIDOSCOPY;  Surgeon: Debby Hila, MD;  Location: [facility];  Service: Endoscopy;  Laterality: N/A;   ILEO CONDUIT     IR CATHETER TUBE CHANGE  12/13/2017   IR CATHETER TUBE CHANGE  01/17/2018   IR CATHETER TUBE CHANGE  02/28/2018   IR CATHETER TUBE CHANGE  07/18/2018   IR CATHETER TUBE CHANGE  08/22/2018   IR CATHETER TUBE CHANGE  10/31/2018   IR CONVERT LEFT NEPHROSTOMY TO NEPHROURETERAL CATH  10/24/2017   IR EXT NEPHROURETERAL CATH EXCHANGE  05/19/2018   IR EXT NEPHROURETERAL CATH EXCHANGE  06/16/2018   IR NEPHRO TUBE REMOV/FL  10/24/2017   IR NEPHROSTOGRAM LEFT  THRU EXISTING ACCESS  12/05/2018   IR NEPHROSTOMY EXCHANGE LEFT  05/11/2024   IR NEPHROSTOMY PLACEMENT LEFT  10/07/2017   IR NEPHROSTOMY PLACEMENT RIGHT  05/04/2024   IR NEPHROSTOMY TUBE CHANGE  04/11/2018   IR URETERAL STENT PLACEMENT EXISTING ACCESS LEFT  05/13/2024   IR URETERAL STENT RIGHT NEW ACCESS W/SEP NEPHROSTOMY CATH  05/13/2024   LAPAROTOMY N/A 04/20/2024   Procedure: EXPLORATORY LAPAROTOMY;  Surgeon: [name], MD;  Location: [facility];  Service: General;  Laterality: N/A;   PARTIAL COLECTOMY N/A 04/21/2024   Procedure: COLECTOMY, PARTIAL;  Surgeon: [name], MD;  Location: [facility];   Service: General;  Laterality: N/A;  Removal Wound Vac, Washout Ostomy, Possible Anastomosis, Possible Ileostomy.  Phasix Mesh.   POLYPECTOMY  06/29/2021   Procedure: POLYPECTOMY;  Surgeon: Debby Hila, MD;  Location: [facility];  Service: Endoscopy;;   ROBOT ASSISTED LAPAROSCOPIC RADICAL PROSTATECTOMY  10/02/2012   Procedure: ROBOTIC ASSISTED LAPAROSCOPIC RADICAL PROSTATECTOMY LEVEL 2;  Surgeon: Noretta Ferrara, MD;  Location: [facility];  Service: Urology;  Laterality: N/A;   ROBOTIC ASSISTED LAPAROSCOPIC BLADDER DIVERTICULECTOMY N/A 08/08/2017   Procedure: XI ROBOTIC ASSISTED LAPAROSCOPIC RADICAL CYSTECTOMY COVERTED TO OPEN PELVIC LYMPHADNECTOMY BILATERAL AND ILEAL CONDUIT URINARY DIVERSION;  Surgeon: Ferrara Glance, MD;  Location: [facility];  Service: Urology;  Laterality: N/A;   TRANSURETHRAL RESECTION OF BLADDER TUMOR  06/10/2017   Procedure: TRANSURETHRAL RESECTION OF BLADDER TUMOR (TURBT);  Surgeon: Ferrara Glance, MD;  Location: [facility];  Service: Urology;;   VACUUM ASSISTED CLOSURE CHANGE N/A 04/20/2024   Procedure: PLACEMENT OF CONCETTA CAPES;  Surgeon: [name], MD;  Location: [facility];  Service: General;  Laterality: N/A;   VENTRAL HERNIA REPAIR N/A 04/10/2024   Procedure: REPAIR, HERNIA, VENTRAL;  Surgeon: [name], MD;  Location: [facility];  Service: General;  Laterality: N/A;   XI ROBOT ABDOMINAL PERINEAL RESECTION N/A 08/08/2017   Procedure: REPAIR OF RECTAL TEAR POSSIBLE PARTIAL PROCTECTOMY, CREATION OF  OSTOMY;  Surgeon: Debby Hila, MD;  Location: [facility];  Service: General;  Laterality: N/A;   XI ROBOTIC ASSISTED PARASTOMAL HERNIA REPAIR N/A 04/10/2024   Procedure: REPAIR, HERNIA, PARASTOMAL AND VENTRAL HERNIAS, ROBOT-ASSISTED, LEFT INGUINAL HERNIA, LYSIS OF ADHESIONS INCARCERATED AND INSIONAL HERNIAS AND UROSTOMY REVISION;  Surgeon: [name], MD;  Location: [facility];  Service: General;  Laterality: N/A;   Social History:  reports that he quit smoking about 44 years ago. His smoking use  included cigarettes. He started smoking about 53 years ago. He has never used smokeless tobacco. He reports current alcohol  use. He reports that he does not use drugs.  Family / Support Systems Marital Status: Married Patient Roles: Spouse, Parent Spouse/Significant Other: Marval [phone] Children: Grown children Other Supports: Friends Anticipated Caregiver: Wife Ability/Limitations of Caregiver: Wife can do min level of assist Caregiver Availability: 24/7 Family Dynamics: Close knit family pt is hopeful he is on the upswing and can start recovering from his health issues  Social History Preferred language: English Religion: Baptist Cultural Background: NA Education: DDS-dentist Health Literacy - How often do you need to have someone help you when you read instructions, pamphlets, or other written material from your doctor or pharmacy?: Never Writes: Yes Employment Status: Retired Marine scientist Issues: NA Guardian/Conservator: None-according to MD pt is not fully capable of making his own decisions. Will look toward his wife for any decisions while here-pt seems to be improving   Abuse/Neglect Abuse/Neglect Assessment Can Be Completed: Yes Physical Abuse: Denies Verbal Abuse: Denies Sexual Abuse:  Denies Exploitation of patient/patient's resources: Denies Self-Neglect: Denies  Patient response to: Social Isolation - How often do you feel lonely or isolated from those around you?: Never  Emotional Status Pt's affect, behavior and adjustment status: Pt is motivated to recover and get back home it has been one thing after another since admission. He hopes to get stronger and move forward now. Wife is presnt and very supportive and involved Recent Psychosocial Issues: other health issues Psychiatric History: No hx due to long hospitalization and issues he has dealt with would benefit from seeing neuro-psych while here Substance Abuse History: NA  Patient /  Family Perceptions, Expectations & Goals Pt/Family understanding of illness & functional limitations: Pt and wife can explain his issues and medical procedures. Both speak with the MD's involved and wife asks questions. Pt reports at times he has not been fully with it. Seems alert and oriented times four today Premorbid pt/family roles/activities: husband, father, retiree, neighbor, friend, etc Anticipated changes in roles/activities/participation: resume Pt/family expectations/goals: Pt states:   I hope to recover and get back home.  Wife states:  I will do what he needs done to get him home.  Community Resources [name]: Other (Comment) ([facility] a few days) Premorbid Home Care/DME Agencies: Other (Comment) (rollator) Transportation available at discharge: wife Is the patient able to respond to transportation needs?: Yes In the past 12 months, has lack of transportation kept you from medical appointments or from getting medications?: No In the past 12 months, has lack of transportation kept you from meetings, work, or from getting things needed for daily living?: No Resource referrals recommended: Neuropsychology  Discharge Planning Living Arrangements: Spouse/significant other Support Systems: Spouse/significant other, Children, Friends/neighbors Type of Residence: Private residence Insurance Resources: Electrical engineer Resources: Social Security, Family Support Financial Screen Referred: No Living Expenses: Own Money Management: Patient, Spouse Does the patient have any problems obtaining your medications?: No Home Management: both Patient/Family Preliminary Plans: Return home with wife being his main caregiver. Aware being evaluated today and goals being set for stay here. Care Coordinator Barriers to Discharge: Wound Care Care Coordinator Anticipated Follow Up Needs: HH/OP  Clinical Impression Pleasant gentleman who has been through much  since hospitalization on 5/30. Wife is very involved and supportive and here daily. Being evaluated today and goals being set for stay here. Will place on neuro-psych list to be seen while here  Raymonde Asberry MATSU 05/28/2024, 9:55 AM

## 2024-05-28 NOTE — Evaluation (Signed)
 Occupational Therapy Assessment and Plan  Patient Details  Name: Kristopher Thompson MRN: [id number] Date of Birth: Mar 22, 2000  OT Diagnosis: acute pain, muscle weakness (generalized), and decreased activity tolerance Rehab Potential: Rehab Potential (ACUTE ONLY): Fair ELOS: 10-14 days   Today's Date: 05/28/2024 OT Individual Time: 1035-1130 OT Individual Time Calculation (min): 55 min  and Today's Date: 05/28/2024 OT Missed Time: 15 Minutes Missed Time Reason: Patient fatigue    Hospital Problem: Principal Problem:   Debility   Past Medical History:  Past Medical History:  Diagnosis Date   At risk for sleep apnea    12-25-2017   STOP-BANG SCORE= 5   --- SENT TO PCP   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   moderate atypia - right low back   Atypical nevus 04/04/2007   moderate to marked - right upper back (wider shave)   Atypical nevus 04/04/2007   moderate atypia - center chest (wider shave)   Atypical nevus 04/04/2007   slight atypia - right thigh   Atypical nevus 11/29/2011   mild atypia - center upper back   Atypical nevus 11/29/2011   mild atypia - center chest   Bacteremia due to Klebsiella pneumoniae 10/09/2017   Bladder cancer (HCC) dx 07/2017   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 denies   Sleep apnea    cpap   Squamous cell carcinoma of skin 05/22/2013   left cheek - CX3 + 5FU   Wears glasses    Past Surgical History:  Past  Surgical History:  Procedure Laterality Date   ABDOMINAL SURGERY     APPENDECTOMY  1972   BOWEL RESECTION N/A 04/20/2024   Procedure: SMALL BOWEL RESECTION;  Surgeon: [name], MD;  Location: [facility];  Service: General;  Laterality: N/A;   CHOLECYSTECTOMY  1985   COLONOSCOPY N/A 06/29/2021   Procedure: COLONOSCOPY;  Surgeon: Debby Hila, MD;  Location: [facility];  Service: Endoscopy;  Laterality: N/A;   COLOSTOMY REVERSAL N/A 01/08/2018   Procedure: COLOSTOMY REVERSAL;  Surgeon: Debby Hila, MD;  Location: [facility];  Service: General;  Laterality: N/A;   CYSTOSCOPY WITH RETROGRADE PYELOGRAM, URETEROSCOPY AND STENT PLACEMENT Right 06/10/2017   Procedure: CYSTOSCOPY WITH RIGHT URETEROSCOPY WITH RIGHT STENT PLACEMENT;  Surgeon: [name], MD;  Location[facility];  Service: Urology;  Laterality: Right;   EUS N/A 04/16/2018   Procedure: FULL UPPER ENDOSCOPIC ULTRASOUND (EUS) RADIAL;  Surgeon: Burnette Fallow, MD;  [facility];  Service: Endoscopy;  Laterality: N/A;   FLEXIBLE SIGMOIDOSCOPY N/A 12/13/2017   Procedure: FLEXIBLE SIGMOIDOSCOPY;  Surgeon: Debby Hila, MD;  [facility];  Service: Endoscopy;  Laterality: N/A;   ILEO CONDUIT     IR CATHETER TUBE CHANGE  12/13/2017   IR CATHETER TUBE CHANGE  01/17/2018   IR CATHETER TUBE CHANGE  02/28/2018   IR CATHETER TUBE CHANGE  07/18/2018   IR CATHETER TUBE CHANGE  08/22/2018   IR CATHETER TUBE CHANGE  10/31/2018   IR CONVERT LEFT NEPHROSTOMY TO NEPHROURETERAL CATH  10/24/2017   IR EXT NEPHROURETERAL CATH EXCHANGE  05/19/2018   IR EXT NEPHROURETERAL CATH EXCHANGE  06/16/2018   IR NEPHRO TUBE REMOV/FL  10/24/2017   IR NEPHROSTOGRAM LEFT THRU EXISTING ACCESS  12/05/2018   IR NEPHROSTOMY EXCHANGE LEFT  05/11/2024   IR NEPHROSTOMY PLACEMENT LEFT  10/07/2017   IR NEPHROSTOMY PLACEMENT RIGHT  05/04/2024   IR NEPHROSTOMY TUBE CHANGE  04/11/2018   IR URETERAL STENT PLACEMENT EXISTING ACCESS LEFT  05/13/2024   IR URETERAL STENT RIGHT NEW  ACCESS W/SEP NEPHROSTOMY CATH  05/13/2024   LAPAROTOMY N/A 04/20/2024   Procedure: EXPLORATORY LAPAROTOMY;  Surgeon: [name], MD;  [facility];  Service: General;  Laterality: N/A;   PARTIAL COLECTOMY N/A 04/21/2024   Procedure: COLECTOMY, PARTIAL;  Surgeon: [name], MD;  [facility];  Service: General;  Laterality: N/A;  Removal Wound Vac, Washout Ostomy, Possible Anastomosis, Possible Ileostomy.  Phasix Mesh.   POLYPECTOMY  06/29/2021   Procedure: POLYPECTOMY;  Surgeon: Debby Hila, MD;  [facility];  Service: Endoscopy;;   ROBOT ASSISTED LAPAROSCOPIC RADICAL PROSTATECTOMY  10/02/2012   Procedure: ROBOTIC ASSISTED LAPAROSCOPIC RADICAL PROSTATECTOMY LEVEL 2;  Surgeon: Noretta Ferrara, MD;  [facility];  Service: Urology;  Laterality: N/A;   ROBOTIC ASSISTED LAPAROSCOPIC BLADDER DIVERTICULECTOMY N/A 08/08/2017   Procedure: XI ROBOTIC ASSISTED LAPAROSCOPIC RADICAL CYSTECTOMY COVERTED TO OPEN PELVIC LYMPHADNECTOMY BILATERAL AND ILEAL CONDUIT URINARY DIVERSION;  Surgeon: Ferrara Glance, MD;  [facility];  Service: Urology;  Laterality: N/A;   TRANSURETHRAL RESECTION OF BLADDER TUMOR  06/10/2017   Procedure: TRANSURETHRAL RESECTION OF BLADDER TUMOR (TURBT);  Surgeon: Ferrara Glance, MD;  [facility];  Service: Urology;;   VACUUM ASSISTED CLOSURE CHANGE N/A 04/20/2024   Procedure: PLACEMENT OF CONCETTA CAPES;  Surgeon: [name], MD;  [facility];  Service: General;  Laterality: N/A;   VENTRAL HERNIA REPAIR N/A 04/10/2024   Procedure: REPAIR, HERNIA, VENTRAL;  Surgeon: [name], MD;  [facility];  Service: General;  Laterality: N/A;   XI ROBOT ABDOMINAL PERINEAL RESECTION N/A 08/08/2017   Procedure: REPAIR OF RECTAL TEAR POSSIBLE PARTIAL PROCTECTOMY, CREATION OF  OSTOMY;  Surgeon: Debby Hila, MD;  [facility];  Service: General;  Laterality: N/A;   XI ROBOTIC ASSISTED PARASTOMAL HERNIA REPAIR N/A 04/10/2024   Procedure: REPAIR, HERNIA, PARASTOMAL  AND VENTRAL HERNIAS, ROBOT-ASSISTED, LEFT INGUINAL HERNIA, LYSIS OF ADHESIONS INCARCERATED AND INSIONAL HERNIAS AND UROSTOMY REVISION;  Surgeon: [name], MD;  [facility];  Service: General;  Laterality: N/A;    Assessment & Plan Clinical Impression: Patient is a 73 year old male with PMHx significant of HTN, chronic diastolic congestive heart failure, CAD, DM2, pulmonary embolism (on Eliquis ), anemia, obesity, prostate CA and bladder CA. Patient was initially admitted by general surgery on 04/10/2024 for operative management regarding parastomal and incisional incarcerated abdominal wall hernias which required extensive lysis, mesh insertion, repair, urostomy ileal conduit revision and was admitted to ICU due to tachycardia and tachypnea. Hospital was course complicated by perforated bowel and postop shock as well as AKI requiring HD. He was stabilized and discharged to SNF on 7/11 but returned at the request due to increased output of ostomy therefore he was directly admitted. Urology consulted for urostomy evaluation and continue to follow. General surgery consulted and recommending continuing wound VAC and bowel regimen for ileostomy. Admission labs creatinine 2.3. DVT prophylaxis Eliquis .  Patient transferred to CIR on 05/27/2024 .    Patient currently requires Mod-Max A with LB care secondary to muscle weakness, decreased cardiorespiratoy endurance, and decreased balance strategies and increased pain.  Prior to hospitalization, patient could complete BADLs/IADLs independently.   Patient will benefit from skilled intervention to increase independence with basic self-care skills prior to discharge home with care partner.  Anticipate patient will require intermittent supervision and follow up outpatient.  OT - End of Session Activity Tolerance: Tolerates < 10 min activity with changes in vital signs Endurance Deficit: Yes OT Assessment Rehab Potential (ACUTE ONLY): Fair OT Barriers to  Discharge: Wound Care OT Patient demonstrates impairments in the following area(s): Balance;Endurance;Pain;Safety OT Basic ADL's Functional Problem(s): Bathing;Dressing OT Transfers Functional Problem(s): Tub/Shower OT Plan OT Intensity: Minimum of 1-2 x/day, 45 to 90 minutes OT Frequency: 5 out of 7 days OT Duration/Estimated Length of Stay: 10-14 days OT Treatment/Interventions: Metallurgist training;Community reintegration;Discharge planning;Disease mangement/prevention;DME/adaptive equipment instruction;Functional mobility training;Neuromuscular re-education;Pain management;Patient/family education;Psychosocial support;Self Care/advanced ADL retraining;Skin care/wound managment;Therapeutic Activities;Therapeutic Exercise;UE/LE Strength taining/ROM OT Basic Self-Care Anticipated Outcome(s): Supervision OT Recommendation Patient destination: Home Follow Up Recommendations: Outpatient OT Equipment Recommended: To be determined   OT Evaluation Precautions/Restrictions  Precautions Precautions: Fall Precaution/Restrictions Comments: Urostomy/Colostomy, Ab wound vac, B nephrostomy drains Restrictions Weight Bearing Restrictions Per Provider Order: No General Chart Reviewed: Yes Family/Caregiver Present: Yes (Spouse, Debby) Vital Signs Therapy Vitals Pulse Rate: 90 BP: 92/65 Patient Position (if appropriate): Sitting (100/64 post standing) Oxygen Therapy SpO2: 100 % O2 Device: Room Air Patient Activity (if Appropriate): In bed Pain Pain Assessment Pain Scale: 0-10 Pain Score: 2  Pain Type: Acute pain Pain Location: Abdomen Pain Intervention(s): Medication (See eMAR);Rest Home Living/Prior Functioning Home Living Family/patient expects to be discharged to:: Private residence Living Arrangements: Spouse/significant other Available Help at Discharge: Family, Available 24 hours/day Type of Home: House Home Access: Stairs to enter [employer] of Steps:  1 Home Layout: Two level, Able to live on main level with bedroom/bathroom Bathroom Shower/Tub: Tub/shower unit (No shower chair, multiple grab bars, and detachable shower head.) Bathroom Toilet: Standard Bathroom Accessibility: Yes  Lives With: Spouse IADL History Occupation: Retired Prior Function Level of Independence: Independent with basic ADLs, Independent with homemaking with ambulation, Independent with gait, Independent with transfers Driving: Yes Vocation: Retired Administrator, sports Baseline Vision/History: 1 Wears glasses Ability to See in Adequate Light: 0 Adequate Patient Visual Report: No change from baseline Vision Assessment?: No apparent visual deficits Perception  Perception: Within Functional Limits Praxis Praxis: WFL Cognition Cognition Overall Cognitive Status: Within Functional Limits for tasks assessed Orientation Level: Place;Person;Situation Awareness: Appears intact Problem Solving: Appears intact Safety/Judgment: Appears intact Brief Interview for Mental Status (BIMS) Repetition of Three Words (First Attempt): 3 Temporal Orientation: Year: Correct Temporal Orientation: Month: Accurate within 5 days Temporal Orientation: Day: Incorrect Recall: Sock: Yes, no cue required Recall: Blue: Yes, no cue required Recall: Bed: Yes, no cue required BIMS Summary Score: 14 Sensation Sensation Light Touch: Impaired Detail Peripheral sensation comments: Reports numbness with onset since surgery. Light Touch Impaired Details: Impaired LLE;Impaired RLE Coordination Gross Motor Movements are Fluid and Coordinated: No Fine Motor Movements are Fluid and Coordinated: Yes Coordination and Movement Description: Deficits due to generalized weakness/debility and pain. Motor  Motor Motor: Other (comment) Motor - Skilled Clinical Observations: Deficits due to generalized weakness/debility and pain.  Trunk/Postural Assessment  Cervical Assessment Cervical Assessment:  Exceptions to [facility] (Forward head) Thoracic Assessment Thoracic Assessment: Exceptions to [facility] (Rounded shoulders) Lumbar Assessment Lumbar Assessment: Exceptions to  WFL (Posterior pelvic tilt) Postural Control Postural Control: Deficits on evaluation Righting Reactions: Decreased/delayed Protective Responses: Decreased/delayed  Balance Balance Balance Assessed: Yes Static Sitting Balance Static Sitting - Balance Support: Feet supported Static Sitting - Level of Assistance: 5: Stand by assistance (SUP) Static Standing Balance Static Standing - Balance Support: During functional activity;Bilateral upper extremity supported Static Standing - Level of Assistance: 5: Stand by assistance;4: Min assist (CGA-Min A) Extremity/Trunk Assessment RUE Assessment RUE Assessment: Exceptions to [facility] Active Range of Motion (AROM) Comments: WFL General Strength Comments: 4-/5 LUE Assessment LUE Assessment: Exceptions to [facility] Active Range of Motion (AROM) Comments: WFL General Strength Comments: 4-/5  Care Tool Care Tool Self Care Eating   Eating Assist Level: Set up assist    Oral Care    Oral Care Assist Level: Set up assist    Bathing   Body parts bathed by patient: Right arm;Left arm;Chest;Abdomen;Front perineal area;Right upper leg;Left upper leg;Face Body parts bathed by helper: Buttocks;Right lower leg;Left lower leg   Assist Level: Minimal Assistance - Patient > 75%    Upper Body Dressing(including orthotics)   What is the patient wearing?: Pull over shirt   Assist Level: Set up assist    Lower Body Dressing (excluding footwear)   What is the patient wearing?: Underwear/pull up Assist for lower body dressing: Maximal Assistance - Patient 25 - 49%    Putting on/Taking off footwear   What is the patient wearing?: Ted hose;Shoes Assist for footwear: Maximal Assistance - Patient 25 - 49%       Care Tool Toileting Toileting activity Toileting Activity did not occur (Clothing  management and hygiene only): N/A (no void or bm) (Urostomy/Colostomy)       Care Tool Bed Mobility Roll left and right activity        Sit to lying activity        Lying to sitting on side of bed activity         Care Tool Transfers Sit to stand transfer        Chair/bed transfer         Toilet transfer         Care Tool Cognition  Expression of Ideas and Wants Expression of Ideas and Wants: 4. Without difficulty (complex and basic) - expresses complex messages without difficulty and with speech that is clear and easy to understand  Understanding Verbal and Non-Verbal Content Understanding Verbal and Non-Verbal Content: 4. Understands (complex and basic) - clear comprehension without cues or repetitions   Memory/Recall Ability Memory/Recall Ability : That he or she is in a hospital/hospital unit   Refer to Care Plan for Long Term Goals  SHORT TERM GOAL WEEK 1 OT Short Term Goal 1 (Week 1): Pt will perform stand-step transfer with Min A + RW. OT Short Term Goal 2 (Week 1): Pt will thread LB garment with Min A + LRAD. OT Short Term Goal 3 (Week 1): Pt will tolerate >2 mins of standing activity for carryover in ADLs.  Recommendations for other services: None    Skilled Therapeutic Intervention Session began with introduction to OT role, OT POC, and general orientation to rehab unit/schedule. Pt with increased dizziness during transitional movements, B TEDs donned. See above for details. Pt with increased fatigue requesting to return to bed, requiring Mod A for BLE elevation. Pt remained resting in bed with all immediate needs met.   ADL ADL Eating: Set up Where Assessed-Eating: Bed level Grooming: Setup Where Assessed-Grooming: Edge of bed Upper  Body Bathing: Supervision/safety;Setup Where Assessed-Upper Body Bathing: Edge of bed Lower Body Bathing: Maximal assistance;Moderate assistance Where Assessed-Lower Body Bathing: Edge of bed Upper Body Dressing: Minimal  assistance (Due to multiple drains.) Where Assessed-Upper Body Dressing: Edge of bed Lower Body Dressing: Moderate assistance;Maximal assistance Where Assessed-Lower Body Dressing: Edge of bed Toileting: Other (Comment) (Urostomy/Colostomy) Toilet Transfer: Not assessed Tub/Shower Transfer: Unable to assess Film/video editor: Unable to assess Mobility  Transfers Sit to Stand: Minimal Assistance - Patient > 75% Stand to Sit: Minimal Assistance - Patient > 75%   Discharge Criteria: Patient will be discharged from OT if patient refuses treatment 3 consecutive times without medical reason, if treatment goals not met, if there is a change in medical status, if patient makes no progress towards goals or if patient is discharged from hospital.  The above assessment, treatment plan, treatment alternatives and goals were discussed and mutually agreed upon: by patient and by family  [name], OTR/L, MSOT  05/28/2024, 11:57 AM

## 2024-05-28 NOTE — Plan of Care (Signed)
  Problem: RH Balance Goal: LTG Patient will maintain dynamic standing with ADLs (OT) Description: LTG:  Patient will maintain dynamic standing balance with assist during activities of daily living (OT)  Flowsheets (Taken 05/28/2024 1207) LTG: Pt will maintain dynamic standing balance during ADLs with: Supervision/Verbal cueing   Problem: RH Bathing Goal: LTG Patient will bathe all body parts with assist levels (OT) Description: LTG: Patient will bathe all body parts with assist levels (OT) Flowsheets (Taken 05/28/2024 1207) LTG: Pt will perform bathing with assistance level/cueing: Supervision/Verbal cueing   Problem: RH Dressing Goal: LTG Patient will perform upper body dressing (OT) Description: LTG Patient will perform upper body dressing with assist, with/without cues (OT). Flowsheets (Taken 05/28/2024 1207) LTG: Pt will perform upper body dressing with assistance level of: Supervision/Verbal cueing Goal: LTG Patient will perform lower body dressing w/assist (OT) Description: LTG: Patient will perform lower body dressing with assist, with/without cues in positioning using equipment (OT) Flowsheets (Taken 05/28/2024 1207) LTG: Pt will perform lower body dressing with assistance level of: Supervision/Verbal cueing   Problem: RH Tub/Shower Transfers Goal: LTG Patient will perform tub/shower transfers w/assist (OT) Description: LTG: Patient will perform tub/shower transfers with assist, with/without cues using equipment (OT) Flowsheets (Taken 05/28/2024 1207) LTG: Pt will perform tub/shower stall transfers with assistance level of: Supervision/Verbal cueing

## 2024-05-28 NOTE — Progress Notes (Signed)
 Inpatient Rehabilitation Admission Medication Review by a Pharmacist  A complete drug regimen review was completed for this patient to identify any potential clinically significant medication issues.  High Risk Drug Classes Is patient taking? Indication by Medication  Antipsychotic Yes ,IV medication Compazine  - nausea  Anticoagulant Yes Apixaban - h/o PE  Antibiotic No   Opioid Yes Oxycodone  -pain  Antiplatelet No   Hypoglycemics/insulin  Yes Insulin  aspart SSI - DM  Vasoactive Medication Yes Midodrine  - hypotension  Chemotherapy No   Other Yes Acetaminophen  - pain Atarax  - antianxiety Crestor - HLD Loperamide , fibercon - bowel regimen for ileostomy  Trazodone  - sleep      Type of Medication Issue Identified Description of Issue Recommendation(s)  Drug Interaction(s) (clinically significant)     Duplicate Therapy     Allergy     No Medication Administration End Date     Incorrect Dose     Additional Drug Therapy Needed     Significant med changes from prior encounter (inform family/care partners about these prior to discharge). Ator    Other       Clinically significant medication issues were identified that warrant physician communication and completion of prescribed/recommended actions by midnight of the next day:  No  Name of provider notified for urgent issues identified:   Provider Method of Notification:     Pharmacist comments:   Time spent performing this drug regimen review (minutes):     Kristopher Thompson, RPh Clinical Pharmacist 05/28/2024 12:34 PM

## 2024-05-28 NOTE — Progress Notes (Signed)
 Inpatient Rehabilitation  Patient information reviewed and entered into eRehab system by [facility]. Karen Kays., CCC/SLP, PPS Coordinator.  Information including medical coding, functional ability and quality indicators will be reviewed and updated through discharge.

## 2024-05-28 NOTE — Progress Notes (Signed)
 PROGRESS NOTE   Subjective/Complaints:   Rough AM secondary to pain.   2-3/10 now, but was 4/10 earlier- tylenol  brought pain down.  Back and  sides were very uncomfortable and couldn't get ina  good position.   BP low but he's upset cannot have salt.  Slept better.     ROS:  Per HPI.   Pt denies SOB, abd pain, CP, N/V/C/D, and vision changes   Objective:   No results found. Recent Labs    05/26/24 0423 05/27/24 0215  WBC 12.6* 8.4  HGB 13.4 12.7*  HCT 39.2 37.2*  PLT 297 246   Recent Labs    05/27/24 0215 05/28/24 0452  NA 130* 128*  K 4.1 3.9  CL 101 103  CO2 16* 16*  GLUCOSE 98 92  BUN 82* 73*  CREATININE 1.91* 2.05*  CALCIUM  9.5 9.0    Intake/Output Summary (Last 24 hours) at 05/28/2024 0850 Last data filed at 05/28/2024 0714 Gross per 24 hour  Intake 236 ml  Output 1100 ml  Net -864 ml        Physical Exam: Vital Signs Blood pressure (!) 89/55, pulse 76, temperature 98 F (36.7 C), temperature source Oral, resp. rate 18, height 5' 11 (1.803 m), weight 92.5 kg, SpO2 100%.    General: awake, alert, appropriate, supine in bed; wife at bedside; NAD HENT: conjugate gaze; oropharynx moist CV: regular rate and rhythm; no JVD Pulmonary: CTA B/L; no W/R/R- good air movement GI: soft, mildly TTP- no rebound; central midline wound VAC- good seal; urostomy and ileostomy are both almost full- also has drain- looks good Psychiatric: appropriate, interactive Neurological: Ox3 Skin: Wound VAC- as detailed above GU- urostomy as above   Assessment/Plan: 1. Functional deficits which require 3+ hours per day of interdisciplinary therapy in a comprehensive inpatient rehab setting. Physiatrist is providing close team supervision and 24 hour management of active medical problems listed below. Physiatrist and rehab team continue to assess barriers to discharge/monitor patient progress toward functional  and medical goals  Care Tool:  Bathing              Bathing assist       Upper Body Dressing/Undressing Upper body dressing        Upper body assist      Lower Body Dressing/Undressing Lower body dressing            Lower body assist       Toileting Toileting    Toileting assist       Transfers Chair/bed transfer  Transfers assist           Locomotion Ambulation   Ambulation assist              Walk 10 feet activity   Assist           Walk 50 feet activity   Assist           Walk 150 feet activity   Assist           Walk 10 feet on uneven surface  activity   Assist           Wheelchair  Assist               Wheelchair 50 feet with 2 turns activity    Assist            Wheelchair 150 feet activity     Assist          Blood pressure (!) 89/55, pulse 76, temperature 98 F (36.7 C), temperature source Oral, resp. rate 18, height 5' 11 (1.803 m), weight 92.5 kg, SpO2 100%.  Medical Problem List and Plan: 1. Functional deficits secondary to debility 2/2 perforated bowel             -patient may not shower due to wound vac             -ELOS/Goals: 10-14 days S             Admit to CIR             First day of therapies- con't CIR PT and OT   2.  Antithrombotics: -DVT/anticoagulation:  Mechanical:  Antiembolism stockings, knee (TED hose) Bilateral lower extremities             -antiplatelet therapy: continue Eliquis  3. Pain Management: continue Scheduled Tylenol ; Oxycodone  as needed   7/17- took tylenol  this AM- pain down to 2-3/10- will con't regimen and monitor if needs more with therapy 4. Mood/Behavior/Sleep: LCSW to follow for evaluation and support when available.              -antipsychotic agents: N/A             -anxiety: continue Atarax  25 mg  5. Neuropsych/cognition: This patient is capable of making decisions on his own behalf.   6. Complicated wound: WOC nurse  consultation             - Continue wound VAC             Zosyn  d/ced as per surgery recommendation   7/17- con't VAC- WOC to change- Tuesday and Friday per WOC note 7. Fluids/Electrolytes/Nutrition: Monitor I's & O's--Recheck monitor labs routinely CBC/CMP              - Bowel regimen for ileostomy: Continue FiberCon and loperamide  twice daily Goal is [phone] mL out fecal ileostomy a day              - Carb/cardiac diet -continue Juven/Prosource/Ensure supplements              -Nausea/vomiting:  seems to have improved- PRN orders Reglan  and Zofran    8. AKI/CKD stage IIIb: Creatinine improving 2.3 now 1.91 -monitor BMP   7/17- Cr 2.05- on IVF's 125/cc/hour- will continue for now and recheck labs in AM             - On continuous IVF NS 125 mL/hr  9. DM type II: 04/01/24 Hemoglobin A1c 5.3  -Monitor BS AC/at bedtime and use SSI 7/17- will change to regular diet and try to have pt make good choices- since poor intake- CBG's low 10.  Severe Hypotension: Order orthostatic vitals              - teds ordered             -increase midodrine  to 10mg  TID   7/17- BP running 80s systolic and feels lightheaded- Midodrine  was increased yesterday to 10 mg TID- if doesn't improve today, will order ACE wraps as well as Thigh high TED's- cannot do abd binder due to abd drains, VAC and  ostomies- if this doesn't improve in next 24 hours, will add Florinef.  Also liberalized his diet to regular diet so can have some salt to help.   11. Stage I Obesity: BMI 31.49 kg/m  -educate on diet and weight loss to promote overall health and mobility.   7/17- BMI down to 28.44 12. Hyperlipidemia: Continue Crestor    13. Screening for vitamin D  deficiency:check vitamin D  level tomorrow 14. Hyponatremia  7/17- Na down to 128- but is on IVF's- hard to fluid restrict due to Renal issues, so will keep on IVF's and recheck in AM    I spent a total of 52   minutes on total care today- >50% coordination of care- due to  D/w  pt and PA as well as NP about pt care- also nursing- and reviewed meds- made changes- reviewed labs and B/B and made changes.     LOS: 1 days A FACE TO FACE EVALUATION WAS PERFORMED  Gorden Stthomas 05/28/2024, 8:50 AM

## 2024-05-28 NOTE — Consult Note (Signed)
 WOC Nurse wound follow up   WOC team notified by bedside nurse that [facility] lost suction,  presented to bedside and noted track pad has disconnected. New track pad applied and suction re-established.  Will plan to follow-up tomorrow 7/18 for scheduled VAC change.  Thank you,  Doyal Polite, RN, MSN, [facility] WOC Team

## 2024-05-28 NOTE — Plan of Care (Signed)
  Problem: Consults Goal: RH GENERAL PATIENT EDUCATION Description: See Patient Education module for education specifics. Outcome: Progressing   Problem: RH BOWEL ELIMINATION Goal: RH STG MANAGE BOWEL WITH ASSISTANCE Description: STG Manage Bowel with minimal assistance Assistance. Outcome: Progressing   Problem: RH BLADDER ELIMINATION Goal: RH STG MANAGE BLADDER WITH ASSISTANCE Description: STG Manage Bladder With minimal  Assistance Outcome: Progressing   Problem: RH SKIN INTEGRITY Goal: RH STG SKIN FREE OF INFECTION/BREAKDOWN Description: Manage skin free of infection/breakdown with minimal assistance Outcome: Progressing   Problem: RH SAFETY Goal: RH STG ADHERE TO SAFETY PRECAUTIONS W/ASSISTANCE/DEVICE Description: STG Adhere to Safety Precautions With minimal assistance Assistance/Device. Outcome: Progressing   Problem: RH PAIN MANAGEMENT Goal: RH STG PAIN MANAGED AT OR BELOW PT'S PAIN GOAL Description: <4 w/ prns Outcome: Progressing   Problem: RH KNOWLEDGE DEFICIT GENERAL Goal: RH STG INCREASE KNOWLEDGE OF SELF CARE AFTER HOSPITALIZATION Description: Manage increase knowledge of self care after hospitalization with minimal assistance from spouse using educational materials provided Outcome: Progressing

## 2024-05-29 ENCOUNTER — Ambulatory Visit (HOSPITAL_COMMUNITY): Admitting: Nurse Practitioner

## 2024-05-29 DIAGNOSIS — R5381 Other malaise: Secondary | ICD-10-CM | POA: Diagnosis not present

## 2024-05-29 DIAGNOSIS — K631 Perforation of intestine (nontraumatic): Secondary | ICD-10-CM | POA: Diagnosis not present

## 2024-05-29 LAB — BASIC METABOLIC PANEL WITH GFR
Anion gap: 9 (ref 5–15)
BUN: 50 mg/dL — ABNORMAL HIGH (ref 8–23)
CO2: 17 mmol/L — ABNORMAL LOW (ref 22–32)
Calcium: 9.1 mg/dL (ref 8.9–10.3)
Chloride: 108 mmol/L (ref 98–111)
Creatinine, Ser: 1.37 mg/dL — ABNORMAL HIGH (ref 0.61–1.24)
GFR, Estimated: 55 mL/min — ABNORMAL LOW (ref >60)
Glucose, Bld: 89 mg/dL (ref 70–99)
Potassium: 4.1 mmol/L (ref 3.5–5.1)
Sodium: 134 mmol/L — ABNORMAL LOW (ref 135–145)

## 2024-05-29 LAB — GLUCOSE, CAPILLARY
Glucose-Capillary: 86 mg/dL (ref 70–99)
Glucose-Capillary: 115 mg/dL — ABNORMAL HIGH (ref 70–99)
Glucose-Capillary: 112 mg/dL — ABNORMAL HIGH (ref 70–99)
Glucose-Capillary: 118 mg/dL — ABNORMAL HIGH (ref 70–99)

## 2024-05-29 LAB — CREATININE, FLUID (PLEURAL, PERITONEAL, JP DRAINAGE): Creat, Fluid: 37.2 mg/dL

## 2024-05-29 LAB — CBC WITH DIFFERENTIAL/PLATELET
Abs Immature Granulocytes: 0.07 K/uL (ref 0.00–0.07)
Basophils Absolute: 0.1 K/uL (ref 0.0–0.1)
Basophils Relative: 1 %
Eosinophils Absolute: 0.6 K/uL — ABNORMAL HIGH (ref 0.0–0.5)
Eosinophils Relative: 8 %
HCT: 35.6 % — ABNORMAL LOW (ref 39.0–52.0)
Hemoglobin: 11.6 g/dL — ABNORMAL LOW (ref 13.0–17.0)
Immature Granulocytes: 1 %
Lymphocytes Relative: 16 %
Lymphs Abs: 1.2 K/uL (ref 0.7–4.0)
MCH: 29.5 pg (ref 26.0–34.0)
MCHC: 32.6 g/dL (ref 30.0–36.0)
MCV: 90.6 fL (ref 80.0–100.0)
Monocytes Absolute: 0.9 K/uL (ref 0.1–1.0)
Monocytes Relative: 13 %
Neutro Abs: 4.3 K/uL (ref 1.7–7.7)
Neutrophils Relative %: 61 %
Platelets: 206 K/uL (ref 150–400)
RBC: 3.93 MIL/uL — ABNORMAL LOW (ref 4.22–5.81)
RDW: 15.1 % (ref 11.5–15.5)
WBC: 7.1 K/uL (ref 4.0–10.5)
nRBC: 0 % (ref 0.0–0.2)

## 2024-05-29 MED ORDER — PROCHLORPERAZINE MALEATE 5 MG PO TABS
5.0000 mg | ORAL_TABLET | Freq: Three times a day (TID) | ORAL | Status: DC
  Administered 2024-05-29 – 2024-06-05 (×21): 5 mg via ORAL
  Filled 2024-05-29 (×21): qty 1

## 2024-05-29 MED ORDER — GERHARDT'S BUTT CREAM
TOPICAL_CREAM | Freq: Two times a day (BID) | CUTANEOUS | Status: DC
  Administered 2024-06-18: 1 via TOPICAL
  Filled 2024-05-29 (×2): qty 60

## 2024-05-29 MED ORDER — FLUDROCORTISONE ACETATE 0.1 MG PO TABS
0.1000 mg | ORAL_TABLET | Freq: Every day | ORAL | Status: DC
  Administered 2024-05-29 – 2024-06-05 (×8): 0.1 mg via ORAL
  Filled 2024-05-29 (×8): qty 1

## 2024-05-29 MED ORDER — OXYCODONE-ACETAMINOPHEN 5-325 MG PO TABS
1.0000 | ORAL_TABLET | ORAL | Status: DC | PRN
  Administered 2024-05-29: 1 via ORAL
  Administered 2024-05-29: 2 via ORAL
  Administered 2024-05-29 (×2): 1 via ORAL
  Administered 2024-05-30 (×2): 2 via ORAL
  Administered 2024-05-30 (×2): 1 via ORAL
  Administered 2024-05-31 – 2024-06-02 (×6): 2 via ORAL
  Administered 2024-06-05 – 2024-06-10 (×6): 1 via ORAL
  Filled 2024-05-29 (×2): qty 2
  Filled 2024-05-29: qty 1
  Filled 2024-05-29 (×2): qty 2
  Filled 2024-05-29: qty 1
  Filled 2024-05-29 (×2): qty 2
  Filled 2024-05-29: qty 1
  Filled 2024-05-29 (×4): qty 2
  Filled 2024-05-29 (×2): qty 1
  Filled 2024-05-29 (×2): qty 2
  Filled 2024-05-29: qty 1
  Filled 2024-05-29 (×2): qty 2
  Filled 2024-05-29: qty 1

## 2024-05-29 NOTE — IPOC Note (Addendum)
 Overall Plan of Care (IPOC) Patient Details Name: Kristopher Thompson MRN: [id number] DOB: Feb 01, 1951  Admitting Diagnosis: Debility  Hospital Problems: Principal Problem:   Debility Active Problems:   S/P ileal conduit (HCC)   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 08/08/2017   GERD (gastroesophageal reflux disease)   Obesity (BMI 35.0-39.9 without comorbidity)   Irritant contact dermatitis associated with fecal stoma   Chronic anticoagulation   History of bladder cancer   Obstructive sleep apnea of adult   Personal history of PE (pulmonary embolism)   Delayed bowel perforation s/p SBR/end ileostomy   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   Ileostomy in place [facility])   H/O insertion of nephrostomy tube   High output ileostomy (HCC)   CKD stage 3b, GFR 30-44 ml/min (HCC)   Adjustment disorder with depressed mood     Functional Problem List: Nursing Bladder, Bowel, Edema, Endurance, Medication Management, Motor, Nutrition, Pain, Perception, Safety, Skin Integrity  PT Balance, Endurance, Motor, Pain, Safety, Skin Integrity  OT Balance, Endurance, Pain, Safety  SLP    TR         Basic ADL's: OT Bathing, Dressing     Advanced  ADL's: OT       Transfers: PT Bed Mobility, Bed to Chair, Car  OT Tub/Shower     Locomotion: PT Ambulation, Psychologist, prison and probation services, Stairs     Additional Impairments: OT    SLP        TR      Anticipated Outcomes Item Anticipated Outcome  Self Feeding    Swallowing      Basic self-care  Supervision  Engineer, technical sales Transfers Supervision  Bowel/Bladder  manage urostomy/ileostomy with supervision assistance  Transfers  supervision  Locomotion  supervision with LRAD  Communication     Cognition     Pain  <4 w/ prns  Safety/Judgment  manage safety with minimal assistance   Therapy Plan: PT Intensity: Minimum of 1-2 x/day ,45 to 90 minutes PT Frequency: 5 out of 7  days PT Duration Estimated Length of Stay: 7-10 days OT Intensity: Minimum of 1-2 x/day, 45 to 90 minutes OT Frequency: 5 out of 7 days OT Duration/Estimated Length of Stay: 10-14 days     Team Interventions: Nursing Interventions Patient/Family Education, Bladder Management, Bowel Management, Disease Management/Prevention, Pain Management, Medication Management, Discharge Planning, Skin Care/Wound Management  PT interventions Ambulation/gait training, Discharge planning, Functional mobility training, Therapeutic Activities, Balance/vestibular training, Disease management/prevention, Neuromuscular re-education, Skin care/wound management, Therapeutic Exercise, Wheelchair propulsion/positioning, DME/adaptive equipment instruction, Pain management, UE/LE Strength taining/ROM, Firefighter, Equities trader education, Museum/gallery curator, UE/LE Coordination activities  OT Interventions Warden/ranger, Firefighter, Discharge planning, Disease mangement/prevention, Fish farm manager, Functional mobility training, Neuromuscular re-education, Pain management, Patient/family education, Psychosocial support, Self Care/advanced ADL retraining, Skin care/wound managment, Therapeutic Activities, Therapeutic Exercise, UE/LE Strength taining/ROM  SLP Interventions    TR Interventions    SW/CM Interventions Discharge Planning, Psychosocial Support, Patient/Family Education   Barriers to Discharge MD  Medical stability, Home enviroment access/loayout, Wound care, Lack of/limited family support, and pain, multiple drains and 2 ostomies  Nursing Decreased caregiver support, Home environment access/layout, Wound Care, Weight Discharge: House  Discharge Home Layout: One level  Discharge Home Access: Stairs to enter  Entrance Stairs-Rails: None  Entrance Stairs-Number of Steps: 1  PT      OT Wound Care    SLP      SW Wound Care     Team Discharge  Planning: Destination: PT-Home ,OT- Home , SLP-  Projected Follow-up: PT-Home health PT, OT-  Outpatient OT, SLP-  Projected Equipment Needs: PT-To be determined, OT- To be determined, SLP-  Equipment Details: PT- , OT-  Patient/family involved in discharge planning: PT- Family member/caregiver, Patient,  OT-Patient, SLP-   MD ELOS: 10-14 days Medical Rehab Prognosis:  Good Assessment: The patient has been admitted for CIR therapies with the diagnosis of debility due to perforated bowel. The team will be addressing functional mobility, strength, stamina, balance, safety, adaptive techniques and equipment, self-care, bowel and bladder mgt, patient and caregiver education, ostomies education. Goals have been set at supervision. Anticipated discharge destination is home with wife.        See Team Conference Notes for weekly updates to the plan of care

## 2024-05-29 NOTE — Progress Notes (Signed)
 PROGRESS NOTE   Subjective/Complaints:   Pt reports pain was horrible last night- meds work, but only last 3-4 hours, and meds ordered q6 hours- increased to q4 hours to help. Said had been taking tylenol , but pain skyrockets with therapy- and afterwards. Back and buttocks/sides where drains are. Makes it difficult to sleep.   Nausea frequently- is impacting his ability to eat a meal- refused dinner last night.   Somewhat dizzy and feels out of it when sitting on EOB. BP initially 99/61, but down to 84/71 when sitting at EOB prolonged period- PT went and got ACE wraps for pt.   Everything draining per pt.  ROS:  Per HPI.    Pt denies SOB, abd pain, CP, N/V/C/D, and vision changes   Objective:   No results found. Recent Labs    05/27/24 0215 05/29/24 0553  WBC 8.4 7.1  HGB 12.7* 11.6*  HCT 37.2* 35.6*  PLT 246 206   Recent Labs    05/28/24 0452 05/29/24 0553  NA 128* 134*  K 3.9 4.1  CL 103 108  CO2 16* 17*  GLUCOSE 92 89  BUN 73* 50*  CREATININE 2.05* 1.37*  CALCIUM  9.0 9.1    Intake/Output Summary (Last 24 hours) at 05/29/2024 0819 Last data filed at 05/29/2024 0747 Gross per 24 hour  Intake 277 ml  Output 3375 ml  Net -3098 ml        Physical Exam: Vital Signs Blood pressure 99/61, pulse 99, temperature 97.6 F (36.4 C), temperature source Oral, resp. rate 19, height 5' 11 (1.803 m), weight 92.5 kg, SpO2 100%.     General: awake, alert, appropriate, sitting EOB with PT; on IVF's- appears a little pale; NAD HENT: conjugate gaze; oropharynx a little dry CV: regular rate and rhythm- but rate borderline tachycardia; no JVD Pulmonary: CTA B/L; no W/R/R- good air movement GI: soft, VAC in place; Ostomies in place- 1/2 full; and multiple drains on abd and sides/back.  Psychiatric: a little grumpy, which is understandable over pain, issues Neurological: Ox3, but delayed responses- not clear if  [facility] or just delayed Skin: Wound VAC- as detailed above GU- urostomy as above   Assessment/Plan: 1. Functional deficits which require 3+ hours per day of interdisciplinary therapy in a comprehensive inpatient rehab setting. Physiatrist is providing close team supervision and 24 hour management of active medical problems listed below. Physiatrist and rehab team continue to assess barriers to discharge/monitor patient progress toward functional and medical goals  Care Tool:  Bathing    Body parts bathed by patient: Right arm, Left arm, Chest, Abdomen, Front perineal area, Right upper leg, Left upper leg, Face   Body parts bathed by helper: Buttocks, Right lower leg, Left lower leg     Bathing assist Assist Level: Minimal Assistance - Patient > 75%     Upper Body Dressing/Undressing Upper body dressing   What is the patient wearing?: Pull over shirt    Upper body assist Assist Level: Set up assist    Lower Body Dressing/Undressing Lower body dressing      What is the patient wearing?: Underwear/pull up     Lower body assist Assist for lower body  dressing: Maximal Assistance - Patient 25 - 49%     Toileting Toileting Toileting Activity did not occur Press photographer and hygiene only): N/A (no void or bm) (Urostomy/Colostomy)  Toileting assist       Transfers Chair/bed transfer  Transfers assist     Chair/bed transfer assist level: Minimal Assistance - Patient > 75%     Locomotion Ambulation   Ambulation assist      Assist level: Minimal Assistance - Patient > 75% (x2 ppl) Assistive device: Walker-rolling Max distance: ~3'   Walk 10 feet activity   Assist  Walk 10 feet activity did not occur: Safety/medical concerns (pt too weak, fatigue)    Assistive device: Walker-rolling   Walk 50 feet activity   Assist Walk 50 feet with 2 turns activity did not occur: Safety/medical concerns (pt too weak, fatigue)         Walk 150 feet  activity   Assist Walk 150 feet activity did not occur: Safety/medical concerns (pt too weak, fatigue)         Walk 10 feet on uneven surface  activity   Assist Walk 10 feet on uneven surfaces activity did not occur: Safety/medical concerns (pt too weak, fatigue)         Wheelchair     Assist Is the patient using a wheelchair?: Yes Type of Wheelchair: Manual    Wheelchair assist level: Dependent - Patient 0%      Wheelchair 50 feet with 2 turns activity    Assist    Wheelchair 50 feet with 2 turns activity did not occur: Safety/medical concerns (pt too weak, fatigue)       Wheelchair 150 feet activity     Assist  Wheelchair 150 feet activity did not occur: Safety/medical concerns (pt too weak, fatigue)       Blood pressure 99/61, pulse 99, temperature 97.6 F (36.4 C), temperature source Oral, resp. rate 19, height 5' 11 (1.803 m), weight 92.5 kg, SpO2 100%.  Medical Problem List and Plan: 1. Functional deficits secondary to debility 2/2 perforated bowel             -patient may not shower due to wound vac             -ELOS/Goals: 10-14 days S             Admit to CIR             Con't CIR PT and OT  HOH makes it harder for pt to understand plan/discuss issues.  IPOC done today.  2.  Antithrombotics: -DVT/anticoagulation:  Mechanical:  Antiembolism stockings, knee (TED hose) Bilateral lower extremities             -antiplatelet therapy: continue Eliquis  3. Pain Management: continue Scheduled Tylenol ; Oxycodone  as needed   7/17- took tylenol  this AM- pain down to 2-3/10- will con't regimen and monitor if needs more with therapy 4. Mood/Behavior/Sleep: LCSW to follow for evaluation and support when available.              -antipsychotic agents: N/A             -anxiety: continue Atarax  25 mg  5. Neuropsych/cognition: This patient is capable of making decisions on his own behalf.   6. Complicated wound: WOC nurse consultation             -  Continue wound VAC             Zosyn  d/ced as per surgery recommendation  7/17- con't VAC- WOC to change- Tuesday and Friday per WOC note 7. Fluids/Electrolytes/Nutrition: Monitor I's & O's--Recheck monitor labs routinely CBC/CMP              - Bowel regimen for ileostomy: Continue FiberCon and loperamide  twice daily Goal is [phone] mL out fecal ileostomy a day              - Carb/cardiac diet -continue Juven/Prosource/Ensure supplements              -Nausea/vomiting:  seems to have improved- PRN orders Reglan  and Zofran    8. AKI/CKD stage IIIb: Creatinine improving 2.3 now 1.91 -monitor BMP   7/17- Cr 2.05- on IVF's 125/cc/hour- will continue for now and recheck labs in AM             - On continuous IVF NS 125 mL/hr   7/18- Will reduce IVFs to 75cc/hour- Cr down to 1.37 and BUN down to 50 from 73.  9. DM type II: 04/01/24 Hemoglobin A1c 5.3  -Monitor BS AC/at bedtime and use SSI 7/17- will change to regular diet and try to have pt make good choices- since poor intake- CBG's low 7/18- BG's 84-125- well controlled- con't regimen- probably low due to poor intake.  10.  Severe Hypotension: Order orthostatic vitals              - teds ordered             -increase midodrine  to 10mg  TID   7/17- BP running 80s systolic and feels lightheaded- Midodrine  was increased yesterday to 10 mg TID- if doesn't improve today, will order ACE wraps as well as Thigh high TED's- cannot do abd binder due to abd drains, VAC and ostomies- if this doesn't improve in next 24 hours, will add Florinef.  Also liberalized his diet to regular diet so can have some salt to help.   7/18- will add Florinef 0.1 mg daily- advised therapy to add ACE wraps- wasn't started yesterday- pt IS symptomatic- will also continue IVFs for now 11. Stage I Obesity: BMI 31.49 kg/m  -educate on diet and weight loss to promote overall health and mobility.   7/17- BMI down to 28.44 12. Hyperlipidemia: Continue Crestor    13. Screening for  vitamin D  deficiency:check vitamin D  level tomorrow 14. Hyponatremia  7/17- Na down to 128- but is on IVF's- hard to fluid restrict due to Renal issues, so will keep on IVF's and recheck in AM  7/18- Na up to 134- will not intervene more 15. Poor appetite, nausea- frequent  7/18- will schedule low dose Compazine  before meals TID due to frequent nausea impacting his ability to eat meals.    I spent a total of 56   minutes on total care today- >50% coordination of care- due to  Spoke to other MD to discuss for weekend; with PT about his BP; nursing about BP and nausea and pain; and prolonged time with pt discussing all issues.   LOS: 2 days A FACE TO FACE EVALUATION WAS PERFORMED  Edla Para 05/29/2024, 8:19 AM

## 2024-05-29 NOTE — Progress Notes (Signed)
 Occupational Therapy Session Note  Patient Details  Name: Kristopher Thompson MRN: [id number] Date of Birth: 03-06-1951  Today's Date: 05/29/2024 OT Individual Time: 0735-0850+1045-1121 OT Individual Time Calculation (min): 75 min    Short Term Goals: Week 1:  OT Short Term Goal 1 (Week 1): Pt will perform stand-step transfer with Min A + RW. OT Short Term Goal 2 (Week 1): Pt will thread LB garment with Min A + LRAD. OT Short Term Goal 3 (Week 1): Pt will tolerate >2 mins of standing activity for carryover in ADLs.  Skilled Therapeutic Interventions/Progress Updates:  Session 1: Pt greeted supine in bed, pt agreeable to OT intervention.    Pt reports 1/10 pain, rest breaks provided as needed.   Transfers/bed mobility/functional mobility: pt completed supine>sit with MIN A to elevate trunk. Pt reports dizziness EOB BP- 99/61  Pt sat EOB for ~ 8 mins while speaking with MD, reassesed BP at this time- 84/71( 77) HR 110  Donned teds and thigh high ace wraps per verbal order from Dr. Cornelio.  Pt completed sit>stand with MODA, stand pivot to w/c with Rw and MINA. BP 92/67 from w/c   Trasnported pt to day room with total A for time mgmt. Attempted to work on standing tolerance however pt reported + pain needing to return to room. Pt completed additional sit>stand from w/c with MIN A, CGA to stand pivot back to bed with RW. Pt needed MODA to elevate BLEs back to bed.    ADLs:  Grooming: pt completed seated oral care and grooming tasks at sink with supervision.    Ended session with pt supine in bed with all needs within reach and bed alarm activated.                     Session 2: Pt greeted seated in w/c, pt agreeable to OT intervention.    Pt reports no pain at start of session.  BP from w/c with only teds donned- 88/67( 76) HR 97 bpm   Transported pt to day room with total A for time mgmt.   Transfers/bed mobility/functional mobility: pt completed stand pivot back to bed at end of  session with RW and CGA. Pt completed sit>supine with MODA to elevate BLEs back to bed.   ADLs:  UB dressing: donned OH shirt with set- up assist.   Exercises: pt completed 5 mins on kinetron for LB strengthening and endurance for higher level ADL tasks, resistance set at 25 lbs.    Ended session with pt supine in bed with all needs within reach and bed alarm activated.                    Therapy Documentation Precautions:  Precautions Precautions: Fall Recall of Precautions/Restrictions: Intact Precaution/Restrictions Comments: Urostomy/Colostomy, Ab wound vac, B nephrostomy drains Restrictions Weight Bearing Restrictions Per Provider Order: No      Therapy/Group: Individual Therapy  Ronal Gift [facility] 05/29/2024, 12:06 PM

## 2024-05-29 NOTE — Progress Notes (Addendum)
 Physical Therapy Session Note  Patient Details  Name: Kristopher Thompson MRN: [id number] Date of Birth: Mar 24, 1951  Today's Date: 05/29/2024 PT Individual Time: 9054-8954 PT Individual Time Calculation (min): 60 min   Short Term Goals: Week 1:  PT Short Term Goal 1 (Week 1): = LTG  Skilled Therapeutic Interventions/Progress Updates:    Session 1: Pt in bed when PT arrived. Pt agreeable for PT.  PT donned bilateral TED hose.  Pt performed bed transfers to EOB with min assist for trunk support.  Pt able to sit up without increasing dizziness, BP 98/56.  Sit to stand to RW with min assist x60 BP 78/52. Seated at EOB BP 101/71, standing with RW BP 82/67, seated EOB 104/72. Pt denies increased dizziness, light headedness.  Pt transferred to w/c with CGA using RW and wheeled to gym. Pt performed standing balance/endurance exercises stacking/unstacking x5 cones and able to perform task with both hands off the RW briefly, self correcting posture upright.  Pt able to tolerate performing task x3 reps before resting. Pt performed gait tx with RW, CGA and w/c following x5'.  Pt's PRE 13 (somewhat hard) post tx session. Pt able to demonstrate good sequencing with transfers, decreasing assist from PT.   Session 2:  Pt in bed when PT arrived. Pt agreeable for PT.  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 Pt performed right sidelying/rolling out of bed with min assist for trunk support, seated at EOB with UE support with feet supported pt demonstrates supervision.  Bed->w/c transfers CGA with RW. Pt wheeled into hallway for gait tx with RW.  Pt amb x6' with CGA with w/c following. Pt reports he would feel more secure with another person pushing w/c to continue ambulating.  PT able to find assistance and pt ambulating x22' without resting into room and transferred to bed.  Pt performed LE ex's in  supine: hip abd/add, heel slides, SLR; x10-15 reps. Pt positioned in semi reclined position with support behind right trunk, all items within reach, wife present during session. PT notified RN regarding draining his urostomy/colostomy bags.         Therapy Documentation Precautions:  Precautions Precautions: Fall Recall of Precautions/Restrictions: Intact Precaution/Restrictions Comments: Urostomy/Colostomy, Ab wound vac, B nephrostomy drains Restrictions Weight Bearing Restrictions Per Provider Order: No  Pain: Pain Assessment Pain Scale: 0-10 Pain Score: 0 Pain Location: Abdomen Pain Intervention(s): Medication (See eMAR)     Therapy/Group: Individual Therapy  [name] Fast 05/29/2024, 11:00 AM

## 2024-05-29 NOTE — Consult Note (Signed)
 WOC Nurse wound follow up Wound type: surgical Measurement: 15.5 x 5 cm x 1.5 cm  Wound bed: clean, red, moist, granulation tissue throughout wound bed Drainage (amount, consistency, odor) serosanguinous Periwound: intact Dressing procedure/placement/frequency: Removed old NPWT dressing   Filled wound with   __1__ piece of black foam, barrier ring utilized at the umbilicus to achieve seal  Sealed NPWT dressing at HG Patient received PO pain medication per bedside nurse prior to dressing change Patient tolerated procedure well  WOC nurse will continue to provide NPWT dressing changed due to the complexity of the dressing change.    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, abd creases at 3 o'clock and 9 o'clock Peristomal assessment:  RLQ ileal conduit: intact RUQ ileostomy: erythema denudation has improved from prior visit, mild erythema remains around the superior aspect of stoma Treatment options for stomal/peristomal skin: 2 inch barrier ring around both stomas Output  Yellow urine Brown stool Ostomy pouching: 1pc convex urostomy pouch Soila [phone]) with 2 barrier rings Soila # [phone]), 1pc soft convex ileostomy Soila # [phone])  Education provided: None Enrolled patient in [facility] [employer] Discharge program: Yes- previous admission  WOC team will continue to provide NPWT dressing changed due to the complexity of the dressing change and ostomy teaching as needed.   Thank you,  Doyal Polite, RN, MSN, [facility] WOC Team

## 2024-05-29 NOTE — Progress Notes (Signed)
 Met with patient to review plan of care, team conference and current situation. Checked patients sacral wound, secured  nephrostomy tubes per patient  the abdominal tube hurts the most. Wound vac changed today by WOC. Ostomy will be changed by WOC later today. Patient c/o pain. Nurse in room to give pain med. Patient claims unable to sleep at night will check meds for sleep. Continue to follow along to provide educational needs to facilitate preparation for discharge.

## 2024-05-30 DIAGNOSIS — R5381 Other malaise: Secondary | ICD-10-CM | POA: Diagnosis not present

## 2024-05-30 DIAGNOSIS — E871 Hypo-osmolality and hyponatremia: Secondary | ICD-10-CM

## 2024-05-30 DIAGNOSIS — K659 Peritonitis, unspecified: Secondary | ICD-10-CM | POA: Diagnosis not present

## 2024-05-30 DIAGNOSIS — I951 Orthostatic hypotension: Secondary | ICD-10-CM | POA: Diagnosis not present

## 2024-05-30 LAB — GLUCOSE, CAPILLARY
Glucose-Capillary: 77 mg/dL (ref 70–99)
Glucose-Capillary: 103 mg/dL — ABNORMAL HIGH (ref 70–99)
Glucose-Capillary: 132 mg/dL — ABNORMAL HIGH (ref 70–99)
Glucose-Capillary: 98 mg/dL (ref 70–99)

## 2024-05-30 NOTE — Plan of Care (Signed)
  Problem: Consults Goal: RH GENERAL PATIENT EDUCATION Description: See Patient Education module for education specifics. Outcome: Progressing   Problem: RH BOWEL ELIMINATION Goal: RH STG MANAGE BOWEL WITH ASSISTANCE Description: STG Manage Bowel with minimal assistance Assistance. Outcome: Progressing   Problem: RH BLADDER ELIMINATION Goal: RH STG MANAGE BLADDER WITH ASSISTANCE Description: STG Manage Bladder With minimal  Assistance Outcome: Progressing   Problem: RH SKIN INTEGRITY Goal: RH STG SKIN FREE OF INFECTION/BREAKDOWN Description: Manage skin free of infection/breakdown with minimal assistance Outcome: Progressing   Problem: RH SAFETY Goal: RH STG ADHERE TO SAFETY PRECAUTIONS W/ASSISTANCE/DEVICE Description: STG Adhere to Safety Precautions With minimal assistance Assistance/Device. Outcome: Progressing   Problem: RH PAIN MANAGEMENT Goal: RH STG PAIN MANAGED AT OR BELOW PT'S PAIN GOAL Description: <4 w/ prns Outcome: Progressing   Problem: RH KNOWLEDGE DEFICIT GENERAL Goal: RH STG INCREASE KNOWLEDGE OF SELF CARE AFTER HOSPITALIZATION Description: Manage increase knowledge of self care after hospitalization with minimal assistance from spouse using educational materials provided Outcome: Progressing

## 2024-05-30 NOTE — Progress Notes (Signed)
 PROGRESS NOTE   Subjective/Complaints:  Patient indicates that his pain is better now that his pain medications have been increased.  He took a total of 30 mg of oxycodone  yesterday and 20 mg thus far today.  He has not had any therapy today.  He continues to receive IV fluids, 0.9 normal saline at 75 mL/h reviewed lab work from yesterday ROS:   Pt denies SOB, abd pain, CP, N/V/C/D,    Objective:   No results found. Recent Labs    05/29/24 0553  WBC 7.1  HGB 11.6*  HCT 35.6*  PLT 206   Recent Labs    05/28/24 0452 05/29/24 0553  NA 128* 134*  K 3.9 4.1  CL 103 108  CO2 16* 17*  GLUCOSE 92 89  BUN 73* 50*  CREATININE 2.05* 1.37*  CALCIUM  9.0 9.1    Intake/Output Summary (Last 24 hours) at 05/30/2024 1209 Last data filed at 05/30/2024 1000 Gross per 24 hour  Intake 524 ml  Output 2750 ml  Net -2226 ml        Physical Exam: Vital Signs Blood pressure (!) 95/51, pulse 84, temperature 97.8 F (36.6 C), temperature source Oral, resp. rate 18, height 5' 11 (1.803 m), weight 92.5 kg, SpO2 99%.  General no acute distress Mood and affect appropriate Lungs clear Heart regular rate and rhythm Abdomen positive bowel sounds midline wound VAC, right lower quadrant ileostomy as well as drain sites with mild tenderness Neurological: Ox3, but delayed responses- not clear if HOH or just delayed Skin: Wound VAC- as detailed above GU- urostomy as above   Assessment/Plan: 1. Functional deficits which require 3+ hours per day of interdisciplinary therapy in a comprehensive inpatient rehab setting. Physiatrist is providing close team supervision and 24 hour management of active medical problems listed below. Physiatrist and rehab team continue to assess barriers to discharge/monitor patient progress toward functional and medical goals  Care Tool:  Bathing    Body parts bathed by patient: Right arm, Left arm, Chest,  Abdomen, Front perineal area, Right upper leg, Left upper leg, Face   Body parts bathed by helper: Buttocks, Right lower leg, Left lower leg     Bathing assist Assist Level: Minimal Assistance - Patient > 75%     Upper Body Dressing/Undressing Upper body dressing   What is the patient wearing?: Pull over shirt    Upper body assist Assist Level: Set up assist    Lower Body Dressing/Undressing Lower body dressing      What is the patient wearing?: Underwear/pull up     Lower body assist Assist for lower body dressing: Maximal Assistance - Patient 25 - 49%     Toileting Toileting Toileting Activity did not occur (Clothing management and hygiene only): N/A (no void or bm) (Urostomy/Colostomy)  Toileting assist       Transfers Chair/bed transfer  Transfers assist     Chair/bed transfer assist level: Contact Guard/Touching assist     Locomotion Ambulation   Ambulation assist      Assist level: Minimal Assistance - Patient > 75% Assistive device: Walker-rolling Max distance: 5'   Walk 10 feet activity   Assist  Walk 10  feet activity did not occur: Safety/medical concerns (pt too weak, fatigue)    Assistive device: Walker-rolling   Walk 50 feet activity   Assist Walk 50 feet with 2 turns activity did not occur: Safety/medical concerns (pt too weak, fatigue)         Walk 150 feet activity   Assist Walk 150 feet activity did not occur: Safety/medical concerns (pt too weak, fatigue)         Walk 10 feet on uneven surface  activity   Assist Walk 10 feet on uneven surfaces activity did not occur: Safety/medical concerns (pt too weak, fatigue)         Wheelchair     Assist Is the patient using a wheelchair?: Yes Type of Wheelchair: Manual    Wheelchair assist level: Dependent - Patient 0% Max wheelchair distance: 200'    Wheelchair 50 feet with 2 turns activity    Assist    Wheelchair 50 feet with 2 turns activity did not  occur:  (pt too weak, fatigue)   Assist Level: Dependent - Patient 0%   Wheelchair 150 feet activity     Assist  Wheelchair 150 feet activity did not occur:  (pt too weak, fatigue)   Assist Level: Dependent - Patient 0%   Blood pressure (!) 95/51, pulse 84, temperature 97.8 F (36.6 C), temperature source Oral, resp. rate 18, height 5' 11 (1.803 m), weight 92.5 kg, SpO2 99%.  Medical Problem List and Plan: 1. Functional deficits secondary to debility 2/2 perforated bowel             -patient may not shower due to wound vac             -ELOS/Goals: 10-14 days S             Admit to CIR             Con't CIR PT and OT  HOH makes it harder for pt to understand plan/discuss issues.  2.  Antithrombotics: -DVT/anticoagulation:  Mechanical:  Antiembolism stockings, knee (TED hose) Bilateral lower extremities             -antiplatelet therapy: continue Eliquis  3. Pain Management: continue Scheduled Tylenol ; Oxycodone  as needed took 30mg  Oxy IR yesterday , 20mg  thus far today    7/17- took tylenol  this AM- pain down to 2-3/10- will con't regimen and monitor if needs more with therapy 4. Mood/Behavior/Sleep: LCSW to follow for evaluation and support when available.              -antipsychotic agents: N/A             -anxiety: continue Atarax  25 mg  5. Neuropsych/cognition: This patient is capable of making decisions on his own behalf.   6. Complicated wound: WOC nurse consultation             - Continue wound VAC             Zosyn  d/ced as per surgery recommendation   7/17- con't VAC- WOC to change- Tuesday and Friday per WOC note 7. Fluids/Electrolytes/Nutrition: Monitor I's & O's--Recheck monitor labs routinely CBC/CMP              - Bowel regimen for ileostomy: Continue FiberCon and loperamide  twice daily Goal is [phone] mL out fecal ileostomy a day              - Carb/cardiac diet -continue Juven/Prosource/Ensure supplements              -  Nausea/vomiting:  seems to have  improved- PRN orders Reglan  and Zofran    8. AKI/CKD stage IIIb: Creatinine improving 2.3 now 1.91 -monitor BMP   7/17- Cr 2.05- on IVF's 125/cc/hour- will continue for now and recheck labs in AM             - On continuous IVF NS 125 mL/hr   7/18- Will reduce IVFs to 75cc/hour- Cr down to 1.37 and BUN down to 50 from 73. Recheck BUN/creatinine in a.m.   9. DM type II: 04/01/24 Hemoglobin A1c 5.3  -Monitor BS AC/at bedtime and use SSI 7/17- will change to regular diet and try to have pt make good choices- since poor intake- CBG's low 7/18- BG's 84-125- well controlled- con't regimen- probably low due to poor intake.  10.  Severe Hypotension: Order orthostatic vitals              - teds ordered             -increase midodrine  to 10mg  TID   7/17- BP running 80s systolic and feels lightheaded- Midodrine  was increased yesterday to 10 mg TID- if doesn't improve today, will order ACE wraps as well as Thigh high TED's- cannot do abd binder due to abd drains, VAC and ostomies- if this doesn't improve in next 24 hours, will add Florinef .  Also liberalized his diet to regular diet so can have some salt to help.   7/18- will add Florinef  0.1 mg daily- advised therapy to add ACE wraps- wasn't started yesterday- pt IS symptomatic- will also continue IVFs for now 11. Stage I Obesity: BMI 31.49 kg/m  -educate on diet and weight loss to promote overall health and mobility.   7/17- BMI down to 28.44 12. Hyperlipidemia: Continue Crestor    13. Screening for vitamin D  deficiency:check vitamin D  level tomorrow 14. Hyponatremia  7/17- Na down to 128- but is on IVF's- hard to fluid restrict due to Renal issues, so will keep on IVF's and recheck in AM  7/18- Na up to 134- will not intervene more 15. Poor appetite, nausea- frequent  7/18- will schedule low dose Compazine  before meals TID due to frequent nausea impacting his ability to eat meals.    LOS: 3 days A FACE TO FACE EVALUATION WAS PERFORMED  Kristopher Thompson 05/30/2024, 12:09 PM

## 2024-05-31 DIAGNOSIS — R5381 Other malaise: Secondary | ICD-10-CM | POA: Diagnosis not present

## 2024-05-31 DIAGNOSIS — E871 Hypo-osmolality and hyponatremia: Secondary | ICD-10-CM | POA: Diagnosis not present

## 2024-05-31 DIAGNOSIS — I951 Orthostatic hypotension: Secondary | ICD-10-CM | POA: Diagnosis not present

## 2024-05-31 DIAGNOSIS — K659 Peritonitis, unspecified: Secondary | ICD-10-CM | POA: Diagnosis not present

## 2024-05-31 LAB — RENAL FUNCTION PANEL
Albumin: 2.4 g/dL — ABNORMAL LOW (ref 3.5–5.0)
Anion gap: 8 (ref 5–15)
BUN: 26 mg/dL — ABNORMAL HIGH (ref 8–23)
CO2: 16 mmol/L — ABNORMAL LOW (ref 22–32)
Calcium: 8.8 mg/dL — ABNORMAL LOW (ref 8.9–10.3)
Chloride: 111 mmol/L (ref 98–111)
Creatinine, Ser: 1.1 mg/dL (ref 0.61–1.24)
GFR, Estimated: >60 mL/min (ref >60)
Glucose, Bld: 84 mg/dL (ref 70–99)
Phosphorus: 2.9 mg/dL (ref 2.5–4.6)
Potassium: 3.6 mmol/L (ref 3.5–5.1)
Sodium: 135 mmol/L (ref 135–145)

## 2024-05-31 LAB — GLUCOSE, CAPILLARY
Glucose-Capillary: 90 mg/dL (ref 70–99)
Glucose-Capillary: 105 mg/dL — ABNORMAL HIGH (ref 70–99)
Glucose-Capillary: 93 mg/dL (ref 70–99)
Glucose-Capillary: 83 mg/dL (ref 70–99)

## 2024-05-31 LAB — CULTURE, BLOOD (ROUTINE X 2)
Culture: NO GROWTH
Culture: NO GROWTH
Special Requests: ADEQUATE
Special Requests: ADEQUATE

## 2024-05-31 NOTE — Plan of Care (Signed)
  Problem: Consults Goal: RH GENERAL PATIENT EDUCATION Description: See Patient Education module for education specifics. Outcome: Progressing   Problem: RH BOWEL ELIMINATION Goal: RH STG MANAGE BOWEL WITH ASSISTANCE Description: STG Manage Bowel with minimal assistance Assistance. Outcome: Progressing   Problem: RH BLADDER ELIMINATION Goal: RH STG MANAGE BLADDER WITH ASSISTANCE Description: STG Manage Bladder With minimal  Assistance Outcome: Progressing   Problem: RH SKIN INTEGRITY Goal: RH STG SKIN FREE OF INFECTION/BREAKDOWN Description: Manage skin free of infection/breakdown with minimal assistance Outcome: Progressing   Problem: RH SAFETY Goal: RH STG ADHERE TO SAFETY PRECAUTIONS W/ASSISTANCE/DEVICE Description: STG Adhere to Safety Precautions With minimal assistance Assistance/Device. Outcome: Progressing   Problem: RH PAIN MANAGEMENT Goal: RH STG PAIN MANAGED AT OR BELOW PT'S PAIN GOAL Description: <4 w/ prns Outcome: Progressing   Problem: RH KNOWLEDGE DEFICIT GENERAL Goal: RH STG INCREASE KNOWLEDGE OF SELF CARE AFTER HOSPITALIZATION Description: Manage increase knowledge of self care after hospitalization with minimal assistance from spouse using educational materials provided Outcome: Progressing   Problem: RH BOWEL ELIMINATION Goal: RH STG MANAGE BOWEL WITH ASSISTANCE Description: STG Manage Bowel with minimal assistance Assistance. Outcome: Progressing   Problem: RH BLADDER ELIMINATION Goal: RH STG MANAGE BLADDER WITH ASSISTANCE Description: STG Manage Bladder With minimal  Assistance Outcome: Progressing   Problem: RH SKIN INTEGRITY Goal: RH STG SKIN FREE OF INFECTION/BREAKDOWN Description: Manage skin free of infection/breakdown with minimal assistance Outcome: Progressing   Problem: RH SAFETY Goal: RH STG ADHERE TO SAFETY PRECAUTIONS W/ASSISTANCE/DEVICE Description: STG Adhere to Safety Precautions With minimal assistance  Assistance/Device. Outcome: Progressing   Problem: RH PAIN MANAGEMENT Goal: RH STG PAIN MANAGED AT OR BELOW PT'S PAIN GOAL Description: <4 w/ prns Outcome: Progressing

## 2024-05-31 NOTE — Progress Notes (Signed)
 Physical Therapy Session Note  Patient Details  Name: Kristopher Thompson MRN: [id number] Date of Birth: [birth year]  Today's Date: 05/31/2024 PT Individual Time: 1000-1040 PT Individual Time Calculation (min): 40 min   Short Term Goals: Week 1:  PT Short Term Goal 1 (Week 1): = LTG  Skilled Therapeutic Interventions/Progress Updates:    Chart reviewed and pt agreeable to therapy. Pt received semi-reclined in bed with c/o mild pain at surgical site. Also of note, pt on continuous IV t/o session. Session focused on functional transfers and amb endurance to promote safe home access. Pt initiated session with transfer to EOB using S + elevated HOB per home set up. Pt then completed SPT to WC using light CGA + RW.  Pt noted to have good technique and sequencing for standing with AD. Pt then amb 45ft + 72ft + 98ft using CGA + Rw fading to close S + RW. Pt required seated rest break after each round of amb. Pt noted to have good understanding of activity tolerance and displayed good awareness of need to rest before energy fatigue increased fall risk. Pt required minA of BLE to return to bed. Session education emphasized continuous amb training to promote activity tolerance and independent home mobility. At end of session, pt was left semi-reclined in bed with alarm engaged, nurse call bell and all needs in reach.     Therapy Documentation Precautions:  Precautions Precautions: Fall Recall of Precautions/Restrictions: Intact Precaution/Restrictions Comments: Urostomy/Colostomy, Ab wound vac, B nephrostomy drains Restrictions Weight Bearing Restrictions Per Provider Order: No General:     Therapy/Group: Individual Therapy  Warrick KANDICE Raspberry 05/31/2024, 12:22 PM

## 2024-05-31 NOTE — Progress Notes (Signed)
 Occupational Therapy Session Note  Patient Details  Name: Kristopher Thompson MRN: [id number] Date of Birth: 02/07/1951  Today's Date: 05/31/2024 OT Individual Time: 0802-0910 OT Individual Time Calculation (min): 68 min    Short Term Goals: Week 1:  OT Short Term Goal 1 (Week 1): Pt will perform stand-step transfer with Min A + RW. OT Short Term Goal 2 (Week 1): Pt will thread LB garment with Min A + LRAD. OT Short Term Goal 3 (Week 1): Pt will tolerate >2 mins of standing activity for carryover in ADLs.  Skilled Therapeutic Interventions/Progress Updates:      Therapy Documentation Precautions:  Precautions Precautions: Fall Recall of Precautions/Restrictions: Intact Precaution/Restrictions Comments: Urostomy/Colostomy, Ab wound vac, B nephrostomy drains Restrictions Weight Bearing Restrictions Per Provider Order: No General: Pt supine in bed upon OT arrival, agreeable to OT session. Pt received medication during session. OT emptied ileostomy bag during session. Nsg emptied nephrostomy bags. Wound vac in place. Wife present   Pain:  2/10 pain reported in abdomen, activity, intermittent rest breaks, distractions provided for pain management, pt reports tolerable to proceed.   ADL: OT providing skilled intervention on ADL retraining in order to increase independence with tasks and increase activity tolerance. Pt completed the following tasks at the current level of assist: Bed mobility: Mod A, assistance with BLE back into bed, able to manage OOB Footwear: total A for TED hose while supine Transfers: CGA with RW bed><W/C with raised bed, OT assisting with managing lines   Exercises: Pt completed the following exercise circuit in order to improve functional activity, strength and endurance to prepare for ADLs such as bathing. Pt completed the following exercises in seated position with ankle weights noted LOB/SOB and 3x10 repetitions on each exercise: -seated marches with 4 ankle  weights -forward punches with 2# dumbbells   Other Treatments: OT providing therapeutic use of self in order to build rapport and discuss patient current situation and goals for therapy.   Pt supine in bed with bed alarm activated, 2 bed rails up, call light within reach and 4Ps assessed.   Therapy/Group: Individual Therapy Camie Hoe, OTD, OTR/L 05/31/2024, 1:02 PM

## 2024-05-31 NOTE — Plan of Care (Signed)
  Problem: Consults Goal: RH GENERAL PATIENT EDUCATION Description: See Patient Education module for education specifics. Outcome: Progressing   Problem: RH BOWEL ELIMINATION Goal: RH STG MANAGE BOWEL WITH ASSISTANCE Description: STG Manage Bowel with minimal assistance Assistance. Outcome: Progressing   Problem: RH BLADDER ELIMINATION Goal: RH STG MANAGE BLADDER WITH ASSISTANCE Description: STG Manage Bladder With minimal  Assistance Outcome: Progressing   Problem: RH SKIN INTEGRITY Goal: RH STG SKIN FREE OF INFECTION/BREAKDOWN Description: Manage skin free of infection/breakdown with minimal assistance Outcome: Progressing   Problem: RH SAFETY Goal: RH STG ADHERE TO SAFETY PRECAUTIONS W/ASSISTANCE/DEVICE Description: STG Adhere to Safety Precautions With minimal assistance Assistance/Device. Outcome: Progressing   Problem: RH PAIN MANAGEMENT Goal: RH STG PAIN MANAGED AT OR BELOW PT'S PAIN GOAL Description: <4 w/ prns Outcome: Progressing   Problem: RH KNOWLEDGE DEFICIT GENERAL Goal: RH STG INCREASE KNOWLEDGE OF SELF CARE AFTER HOSPITALIZATION Description: Manage increase knowledge of self care after hospitalization with minimal assistance from spouse using educational materials provided Outcome: Progressing

## 2024-05-31 NOTE — Progress Notes (Signed)
 PROGRESS NOTE   Subjective/Complaints: Got up with therapy ambulated this am , had some dizziness and fatigue, did not recall BP being check both sitting and standing  Patient indicates that his pain is better now that his pain medications have been increased.  He took a total of 30 mg of oxycodone  yesterday and 20 mg thus far today.  He has not had any therapy today.  He continues to receive IV fluids, 0.9 normal saline at 75 mL/h reviewed lab work from yesterday ROS:   Pt denies SOB, abd pain, CP, N/V/C/D,    Objective:   No results found. Recent Labs    05/29/24 0553  WBC 7.1  HGB 11.6*  HCT 35.6*  PLT 206   Recent Labs    05/29/24 0553 05/31/24 0551  NA 134* 135  K 4.1 3.6  CL 108 111  CO2 17* 16*  GLUCOSE 89 84  BUN 50* 26*  CREATININE 1.37* 1.10  CALCIUM  9.1 8.8*    Intake/Output Summary (Last 24 hours) at 05/31/2024 1045 Last data filed at 05/31/2024 1007 Gross per 24 hour  Intake 6473.85 ml  Output 3825 ml  Net 2648.85 ml        Physical Exam: Vital Signs Blood pressure 108/64, pulse 81, temperature 97.8 F (36.6 C), temperature source Oral, resp. rate 18, height 5' 11 (1.803 m), weight 92.5 kg, SpO2 100%.  General no acute distress Mood and affect appropriate Lungs clear Heart regular rate and rhythm Abdomen positive bowel sounds midline wound VAC, right lower quadrant ileostomy as well as drain sites with mild tenderness Neurological: Ox3, but delayed responses- not clear if HOH or just delayed Skin: Wound VAC- as detailed above GU- urostomy as above   Assessment/Plan: 1. Functional deficits which require 3+ hours per day of interdisciplinary therapy in a comprehensive inpatient rehab setting. Physiatrist is providing close team supervision and 24 hour management of active medical problems listed below. Physiatrist and rehab team continue to assess barriers to discharge/monitor patient  progress toward functional and medical goals  Care Tool:  Bathing    Body parts bathed by patient: Right arm, Left arm, Chest, Abdomen, Front perineal area, Right upper leg, Left upper leg, Face   Body parts bathed by helper: Buttocks, Right lower leg, Left lower leg     Bathing assist Assist Level: Minimal Assistance - Patient > 75%     Upper Body Dressing/Undressing Upper body dressing   What is the patient wearing?: Pull over shirt    Upper body assist Assist Level: Set up assist    Lower Body Dressing/Undressing Lower body dressing      What is the patient wearing?: Underwear/pull up     Lower body assist Assist for lower body dressing: Maximal Assistance - Patient 25 - 49%     Toileting Toileting Toileting Activity did not occur (Clothing management and hygiene only): N/A (no void or bm) (Urostomy/Colostomy)  Toileting assist       Transfers Chair/bed transfer  Transfers assist     Chair/bed transfer assist level: Contact Guard/Touching assist     Locomotion Ambulation   Ambulation assist      Assist level: Minimal Assistance -  Patient > 75% Assistive device: Walker-rolling Max distance: 5'   Walk 10 feet activity   Assist  Walk 10 feet activity did not occur: Safety/medical concerns (pt too weak, fatigue)    Assistive device: Walker-rolling   Walk 50 feet activity   Assist Walk 50 feet with 2 turns activity did not occur: Safety/medical concerns (pt too weak, fatigue)         Walk 150 feet activity   Assist Walk 150 feet activity did not occur: Safety/medical concerns (pt too weak, fatigue)         Walk 10 feet on uneven surface  activity   Assist Walk 10 feet on uneven surfaces activity did not occur: Safety/medical concerns (pt too weak, fatigue)         Wheelchair     Assist Is the patient using a wheelchair?: Yes Type of Wheelchair: Manual    Wheelchair assist level: Dependent - Patient 0% Max wheelchair  distance: 200'    Wheelchair 50 feet with 2 turns activity    Assist    Wheelchair 50 feet with 2 turns activity did not occur:  (pt too weak, fatigue)   Assist Level: Dependent - Patient 0%   Wheelchair 150 feet activity     Assist  Wheelchair 150 feet activity did not occur:  (pt too weak, fatigue)   Assist Level: Dependent - Patient 0%   Blood pressure 108/64, pulse 81, temperature 97.8 F (36.6 C), temperature source Oral, resp. rate 18, height 5' 11 (1.803 m), weight 92.5 kg, SpO2 100%.  Medical Problem List and Plan: 1. Functional deficits secondary to debility 2/2 perforated bowel             -patient may not shower due to wound vac             -ELOS/Goals: 10-14 days S             Admit to CIR             Con't CIR PT and OT  HOH makes it harder for pt to understand plan/discuss issues.  2.  Antithrombotics: -DVT/anticoagulation:  Mechanical:  Antiembolism stockings, knee (TED hose) Bilateral lower extremities             -antiplatelet therapy: continue Eliquis  3. Pain Management: continue Scheduled Tylenol ; Oxycodone  as needed took 30mg  Oxy IR yesterday , 20mg  thus far today    7/17- took tylenol  this AM- pain down to 2-3/10- will con't regimen and monitor if needs more with therapy 4. Mood/Behavior/Sleep: LCSW to follow for evaluation and support when available.              -antipsychotic agents: N/A             -anxiety: continue Atarax  25 mg  5. Neuropsych/cognition: This patient is capable of making decisions on his own behalf.   6. Complicated wound: WOC nurse consultation             - Continue wound VAC             Zosyn  d/ced as per surgery recommendation   7/17- con't VAC- WOC to change- Tuesday and Friday per WOC note 7. Fluids/Electrolytes/Nutrition: Monitor I's & O's--Recheck monitor labs routinely CBC/CMP              - Bowel regimen for ileostomy: Continue FiberCon and loperamide  twice daily Goal is [phone] mL out fecal ileostomy a day               -  Carb/cardiac diet -continue Juven/Prosource/Ensure supplements              -Nausea/vomiting:  seems to have improved- PRN orders Reglan  and Zofran    8. AKI/CKD stage IIIb: Creatinine improving 2.3 now 1.91 -monitor BMP   7/17- Cr 2.05- on IVF's 125/cc/hour- will continue for now and recheck labs in AM             - On continuous IVF NS 125 mL/hr   7/18- Will reduce IVFs to 75cc/hour-     Latest Ref Rng & Units 05/31/2024    5:51 AM 05/29/2024    5:53 AM 05/28/2024    4:52 AM  BMP  Glucose 70 - 99 mg/dL 84  89  92   BUN 8 - 23 mg/dL 26  50  73   Creatinine 0.61 - 1.24 mg/dL 8.89  8.62  7.94   Sodium 135 - 145 mmol/L 135  134  128   Potassium 3.5 - 5.1 mmol/L 3.6  4.1  3.9   Chloride 98 - 111 mmol/L 111  108  103   CO2 22 - 32 mmol/L 16  17  16    Calcium  8.9 - 10.3 mg/dL 8.8  9.1  9.0     2/79- reduce IVF to 50cc/hr Recent ECHO showed normal Ej fx, no concerns or signs of fluid overload at this time  9. DM type II: 04/01/24 Hemoglobin A1c 5.3  -Monitor BS AC/at bedtime and use SSI 7/17- will change to regular diet and try to have pt make good choices- since poor intake- CBG's low 7/18- BG's 84-125- well controlled- con't regimen- probably low due to poor intake.  10.  Severe Hypotension: Order orthostatic vitals              - teds ordered             -increase midodrine  to 10mg  TID   7/17- BP running 80s systolic and feels lightheaded- Midodrine  was increased yesterday to 10 mg TID- if doesn't improve today, will order ACE wraps as well as Thigh high TED's- cannot do abd binder due to abd drains, VAC and ostomies- if this doesn't improve in next 24 hours, will add Florinef .  Also liberalized his diet to regular diet so can have some salt to help.   7/18- will add Florinef  0.1 mg daily- advised therapy to add ACE wraps- wasn't started yesterday- pt IS symptomatic- will also continue IVFs for now Vitals:   05/31/24 0526 05/31/24 1010  BP: (!) 99/49 108/64  Pulse: 81   Resp:     Temp: 97.8 F (36.6 C)   SpO2: 100%   Overall improved since 7/16 but remains soft cont IVF but at lower rate   11. Stage I Obesity: BMI 31.49 kg/m  -educate on diet and weight loss to promote overall health and mobility.   7/17- BMI down to 28.44 12. Hyperlipidemia: Continue Crestor    13. Screening for vitamin D  deficiency:check vitamin D  level tomorrow 14. Hyponatremia  7/17- Na down to 128- but is on IVF's- hard to fluid restrict due to Renal issues, so will keep on IVF's and recheck in AM  7/18- Na up to 134- will not intervene more 15. Poor appetite, nausea- frequent  7/18- will schedule low dose Compazine  before meals TID due to frequent nausea impacting his ability to eat meals.    LOS: 4 days A FACE TO FACE EVALUATION WAS PERFORMED  Prentice FORBES Compton 05/31/2024, 10:45 AM

## 2024-05-31 NOTE — Progress Notes (Signed)
 Occupational Therapy Session Note  Patient Details  Name: Kristopher Thompson MRN: [id number] Date of Birth: [birth year]  Today's Date: 05/31/2024 OT Individual Time: 1300-1410 OT Individual Time Calculation (min): 70 min    Short Term Goals: Week 1:  OT Short Term Goal 1 (Week 1): Pt will perform stand-step transfer with Min A + RW. OT Short Term Goal 2 (Week 1): Pt will thread LB garment with Min A + LRAD. OT Short Term Goal 3 (Week 1): Pt will tolerate >2 mins of standing activity for carryover in ADLs.  Skilled Therapeutic Interventions/Progress Updates:    1:1 Pt received in the bed. Pt came to EOb with min A with extra time. BP sitting EOB was 115/67 Hr 103 Pt performed emptying his ostomy bags (2) with education and returned demonstration. RN emptied drains.  Standing 82/60 with knee high TEDS ACe wrapped to thighs over the TEDS ambulated ~ 30 feet BP  104/92.  HR 113 After another walk 122/81 HR109 After another walk 125/66  HR 110  In total ambulated 97 feet with rest beaks along the way.  Pt reported needing to stop and sit due to fatigue.  Pt performed stand pivot transfer to and from the Nustep with contact guard with RW. Performed 8 min on Nustep with resistance on 4 at ~24 RPMs.  Pt returned to the room and transferred into the bed with mod A for management of legs and to align chest.  Therapy Documentation Precautions:  Precautions Precautions: Fall Recall of Precautions/Restrictions: Intact Precaution/Restrictions Comments: Urostomy/Colostomy, Ab wound vac, B nephrostomy drains Restrictions Weight Bearing Restrictions Per Provider Order: No General:   Vital Signs: Therapy Vitals Pulse Rate: (!) 103 BP: 115/67 Patient Position (if appropriate): Orthostatic Vitals Pain: Reports pain along drain sites on right side - RN aware and provided rest breaks as needed.   Therapy/Group: Individual Therapy  Claudene Nest [facility] 05/31/2024, 2:24 PM

## 2024-05-31 NOTE — Progress Notes (Signed)
 Physical Therapy Session Note  Patient Details  Name: Kristopher Thompson MRN: [id number] Date of Birth: 1951/01/23  Today's Date: 05/31/2024 PT Individual Time: 0245-0325 PT Individual Time Calculation (min): 40 min   Short Term Goals: Week 1:  PT Short Term Goal 1 (Week 1): = LTG  Skilled Therapeutic Interventions/Progress Updates:    Patient lying in bed on entrance to room. Patient alert and agreeable to PT session. Pt reports that he is very tired and mentioned how much he did in the previous session. Pt agreed to try and do as much as he could. Pt asked to back to the room due to fatigue.    Therapeutic Activity: Bed Mobility: Pt performed supine<>sit on EOB with minA with HOB elevated. Pt performed sit to supine with maxA for lifting legs due to fatigue. Pt needed help to position himself in bed properly and VC required for positioning techniques. Pt also needs assistance to manage lines and tubes for safety during transfers and positioning.   Kinatron - 10 min x 10cm/sec - Pt performed continuous steps for endurance and activity tolerance with several breaks needed throughout the session.  . Transfers: Pt performed sit<>stand and stand pivot transfers throughout session with CGA. No verbal cueing needed. .  Wheelchair Mobility:  Pt propelled wheelchair 50 ft using leg and the last 31ft using both arms and legs for a total of 90 feet with supervision.    Patient in supine in bed at end of session with brakes locked, bed alarm set, and all needs within reach.   Therapy Documentation Precautions:  Precautions Precautions: Fall Recall of Precautions/Restrictions: Intact Precaution/Restrictions Comments: Urostomy/Colostomy, Ab wound vac, B nephrostomy drains Restrictions Weight Bearing Restrictions Per Provider Order: No    Therapy/Group: Individual Therapy  Waller Marcussen 05/31/2024, 3:25 PM

## 2024-06-01 DIAGNOSIS — F4321 Adjustment disorder with depressed mood: Secondary | ICD-10-CM | POA: Diagnosis not present

## 2024-06-01 DIAGNOSIS — R5381 Other malaise: Secondary | ICD-10-CM | POA: Diagnosis not present

## 2024-06-01 LAB — BASIC METABOLIC PANEL WITH GFR
Anion gap: 8 (ref 5–15)
BUN: 20 mg/dL (ref 8–23)
CO2: 14 mmol/L — ABNORMAL LOW (ref 22–32)
Calcium: 8.6 mg/dL — ABNORMAL LOW (ref 8.9–10.3)
Chloride: 114 mmol/L — ABNORMAL HIGH (ref 98–111)
Creatinine, Ser: 1.08 mg/dL (ref 0.61–1.24)
GFR, Estimated: >60 mL/min (ref >60)
Glucose, Bld: 92 mg/dL (ref 70–99)
Potassium: 3.7 mmol/L (ref 3.5–5.1)
Sodium: 136 mmol/L (ref 135–145)

## 2024-06-01 LAB — CBC WITH DIFFERENTIAL/PLATELET
Abs Immature Granulocytes: 0.09 K/uL — ABNORMAL HIGH (ref 0.00–0.07)
Basophils Absolute: 0.1 K/uL (ref 0.0–0.1)
Basophils Relative: 1 %
Eosinophils Absolute: 0.6 K/uL — ABNORMAL HIGH (ref 0.0–0.5)
Eosinophils Relative: 8 %
HCT: 32.1 % — ABNORMAL LOW (ref 39.0–52.0)
Hemoglobin: 10.7 g/dL — ABNORMAL LOW (ref 13.0–17.0)
Immature Granulocytes: 1 %
Lymphocytes Relative: 18 %
Lymphs Abs: 1.2 K/uL (ref 0.7–4.0)
MCH: 29.9 pg (ref 26.0–34.0)
MCHC: 33.3 g/dL (ref 30.0–36.0)
MCV: 89.7 fL (ref 80.0–100.0)
Monocytes Absolute: 0.7 K/uL (ref 0.1–1.0)
Monocytes Relative: 10 %
Neutro Abs: 4.1 K/uL (ref 1.7–7.7)
Neutrophils Relative %: 62 %
Platelets: 201 K/uL (ref 150–400)
RBC: 3.58 MIL/uL — ABNORMAL LOW (ref 4.22–5.81)
RDW: 14.8 % (ref 11.5–15.5)
WBC: 6.7 K/uL (ref 4.0–10.5)
nRBC: 0 % (ref 0.0–0.2)

## 2024-06-01 LAB — GLUCOSE, CAPILLARY
Glucose-Capillary: 84 mg/dL (ref 70–99)
Glucose-Capillary: 132 mg/dL — ABNORMAL HIGH (ref 70–99)
Glucose-Capillary: 100 mg/dL — ABNORMAL HIGH (ref 70–99)
Glucose-Capillary: 96 mg/dL (ref 70–99)

## 2024-06-01 MED ORDER — FERROUS SULFATE 325 (65 FE) MG PO TABS
325.0000 mg | ORAL_TABLET | Freq: Two times a day (BID) | ORAL | Status: DC
  Administered 2024-06-01 – 2024-06-19 (×36): 325 mg via ORAL
  Filled 2024-06-01 (×37): qty 1

## 2024-06-01 MED ORDER — LOPERAMIDE HCL 2 MG PO CAPS
4.0000 mg | ORAL_CAPSULE | Freq: Three times a day (TID) | ORAL | Status: DC
  Administered 2024-06-01 – 2024-06-02 (×3): 4 mg via ORAL
  Filled 2024-06-01 (×3): qty 2

## 2024-06-01 NOTE — Consult Note (Addendum)
 Neuropsychological Consultation Comprehensive Inpatient Rehab   Patient:   Kristopher Thompson   DOB:   11/27/1950  MR Number:  [id number]  Location:  [facility] [address] [place] [address] Dept: [phone] Loc: [phone]           Date of Service:   06/01/2024  Start Time:   8 AM End Time:   9 AM  Provider/Observer:  Norleen Asa, Psy.D.       Clinical Neuropsychologist       Billing Code/Service: [phone]  Reason for Service:    Kristopher Thompson is a 73 year old male referred for neuropsychological consultation during admission to the comprehensive inpatient rehabilitation unit.  Referral for neuropsychological consultation due to coping and adjustment issues during admission to the comprehensive inpatient rehabilitation (CIR) unit. This follows complications from gastrointestinal surgery.  HISTORY OF PRESENTING PROBLEM(S) - History of Presenting Problem(s): Admitted on 04/10/2024 for operative management of abdominal wall hernias, which was an extensive intervention. Hospital course was complicated by perforated bowel, post-operative shock, and acute kidney injury requiring hemodialysis. Developed tachycardia and tachypnea, requiring ICU admission. Discharged to a skilled nursing facility on 05/22/2024 but returned due to increased ostomy output. Admitted to the comprehensive inpatient rehabilitation unit due to decreased functional mobility and significant pain at the ostomy site. Reports the situation has been like one train wreck after another.  The patient reports a bad experience at the skilled nursing facility, stating they were unprepared for his needs.  MEDICAL HISTORY - Personal and Family Medical History: - Personal medical history includes hypertension, chronic diastolic congestive heart failure, coronary artery disease, diabetes, pulmonary embolism, anemia, prostate cancer, and  bladder cancer. - Currently has an ileal conduit. There is concern about a tear that Dr. Renda is monitoring, with potential for more surgery.  SUBSTANCE USE - Substance Use: Not discussed.  RISK ASSESSMENT Risk Assessment: - Suicidal Ideation: No suicidal ideation reported. - Self-harm: No self-harm reported.  MENTAL STATE EXAM: - Behaviour: Cooperative and engaged in the session. - Mood: Reports mood is up and down. Sometimes feels good, other times wonders if he will ever get out of the hospital. - Insight: Acknowledges the difficulty of the situation. Responded positively to psychoeducation about his current state versus future potential. Understands the rationale for intensive rehabilitation.   Medical History:   Past Medical History:  Diagnosis Date   At risk for sleep apnea    12-25-2017   STOP-BANG SCORE= 5   --- SENT TO PCP   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   moderate atypia - right low back   Atypical nevus 04/04/2007   moderate to marked - right upper back (wider shave)   Atypical nevus 04/04/2007   moderate atypia - center chest (wider shave)   Atypical nevus 04/04/2007   slight atypia - right thigh   Atypical nevus 11/29/2011   mild atypia - center upper back   Atypical nevus 11/29/2011   mild atypia - center chest   Bacteremia due to Klebsiella pneumoniae 10/09/2017   Bladder cancer (HCC) dx 07/2017   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)    since 08-08-2017-- per pt 12-25-2017 reddness around stoma   Diabetes mellitus without complication (HCC)    GERD (gastroesophageal reflux disease)    H/O hiatal hernia  History of sepsis 09/2017   dx bacteremia due to klebsiella pneumoniae,  post op intraabdominal abscess   Prostate cancer [facility]) urologist-- dr renda   10-02-2012 s/p  prostatectomy-- Stage T1c   RBBB    Renal  disorder    pt. 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 05/11/2024   Delayed bowel perforation s/p SBR/end ileostomy 04/29/2024   Pressure injury of skin 04/29/2024   Sinus tachycardia 04/13/2024   Tachypnea 04/13/2024   Acute respiratory insufficiency, postoperative 04/13/2024   Sepsis due to undetermined organism (HCC) 04/13/2024   Lactic acidosis 04/13/2024   Class 2 obesity 04/13/2024   Incarcerated incisional hernia 04/10/2024   Chronic anticoagulation 03/02/2024   Personal history of PE (pulmonary embolism) 03/02/2024   Irritant contact dermatitis associated with fecal stoma 05/28/2023   Non-recurrent bilateral inguinal hernia without obstruction or gangrene 05/28/2023   Obstructive sleep apnea of adult 11/21/2022   Post-traumatic arthritis of ankle, 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   Hearing loss 07/24/2021   History of bladder cancer 07/24/2021   Prolonged QT interval 12/07/2018   AKI (acute kidney injury) (HCC) 06/15/2018   Fever 10/09/2017   GERD (gastroesophageal reflux disease) 08/11/2017   Obesity (BMI 35.0-39.9 without comorbidity) 08/11/2017   S/P ileal conduit (HCC) 08/08/2017   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 08/08/2017 08/08/2017    Family Med/Psych History:  Family History  Problem Relation Age of Onset   Lung cancer Mother    Hypertension Father    Colon cancer Other    CAD Neg Hx    Diabetes Neg Hx    Stroke Neg Hx     Impression/DX:   Kristopher Thompson is a 73 year old male with significant medical comorbidities, experiencing coping and  adjustment difficulties following a prolonged and complicated hospitalization for gastrointestinal surgery. - Experiencing fluctuating mood related to his medical condition and prolonged recovery.  SESSION SUMMARY: Provided psychoeducation regarding the nature of the comprehensive inpatient rehabilitation unit, emphasizing that its purpose is recovery and the expectation is for him to improve and return to his life. Discussed the human tendency to find it difficult to imagine a future state different from the present. Provided a cognitive reframing technique: encouraging him to add the words right now to his thoughts about his current difficult situation to reinforce its temporary nature. Explained the rationale behind the intensity of the therapy schedule, noting that muscle atrophy occurs four times faster than strength is gained. Addressed the concept of hitting the wall (emotional overwhelm interfering with participation in care) and encouraged him to communicate with staff if he feels he is struggling.  Disposition/Plan:  Kristopher Thompson presents with adjustment issues related to a complicated and prolonged hospitalization. This appears to have been precipitated by extensive surgery for abdominal hernias followed by multiple serious complications including a perforated bowel, post-operative shock, and acute kidney injury. Factors that seem to have predisposed the patient to these difficulties include his multiple chronic medical conditions. The current problem is maintained by ongoing  medical issues, significant pain, decreased mobility, and the uncertainty of his recovery timeline. However, the protective and positive factors include his compliance with therapy, motivation to regain strength, and his admission to a comprehensive rehabilitation unit where the expectation is recovery.        Electronically Signed   _______________________ Norleen Asa, Psy.D. Clinical  Neuropsychologist

## 2024-06-01 NOTE — Progress Notes (Addendum)
 Nursing expressed concerns about the appearance of the skin around patients right nephrostomy. Patient denies pain, tenderness, no new drainage noted, labs stable.  WOC Nurse presented to bedside during this assessment endorsing that area to be much improved from prior. Will continue to monitor. Media in chart.     Kristopher Savoca, FNP-BC  06/01/24  12:21 PM

## 2024-06-01 NOTE — Discharge Summary (Signed)
 Physician Discharge Summary  Patient ID: Kristopher Thompson MRN: [id number] DOB/AGE: 15-Mar-1951 79 y.o.  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   Ileostomy in place [facility])   H/O insertion of nephrostomy tube   High output ileostomy (HCC)   CKD stage 3b, GFR 30-44 ml/min (HCC)   Adjustment disorder with depressed mood High output ileostomy Hypotension Anxiety Acute kidney injury Chronic kidney disease stage IIIb Diabetes type 2 Stage I obesity Hyperlipidemia Hyponatremia Nausea  Discharged Condition: stable  Significant Diagnostic Studies:    Labs:  Basic Metabolic Panel: Recent Labs  Lab 06/11/24 0417 06/13/24 0416 06/15/24 0402  NA 131* 133* 133*  K 4.1 3.8 3.9  CL 105 107 105  CO2 16* 18* 18*  GLUCOSE 94 99 101*  BUN 23 25* 36*  CREATININE 1.36* 1.24 1.41*  CALCIUM  9.8 10.2 10.4*    CBC: Recent Labs  Lab 06/11/24 0417 06/13/24 0416 06/15/24 0402  WBC 7.5 7.6 9.0  NEUTROABS 3.8 3.2 4.2  HGB 11.1* 12.0* 12.9*  HCT 34.0* 36.0* 39.4  MCV 87.0 85.9 86.2  PLT 306 352 370    CBG: Recent Labs  Lab 06/16/24 1727 06/16/24 2043 06/17/24 0555 06/17/24 1213 06/17/24 1706  GLUCAP 101* 124* 93 145* 106*    Brief HPI:   Kristopher Thompson is a 73 y.o. male  with PMHx significant of HTN, chronic diastolic congestive heart failure, CAD, DM2, pulmonary embolism (on Eliquis ), anemia, obesity,  prostate CA and bladder CA.  Patient was initially admitted by general surgery on 04/10/2024 for operative management regarding parastomal and incisional incarcerated abdominal wall hernias which required extensive lysis, mesh insertion, repair, urostomy ileal conduit revision and was admitted to ICU due to tachycardia and tachypnea.  Hospital was course complicated by perforated bowel and postop shock as well as AKI requiring HD.  He was stabilized and discharged to SNF on 7/11 but returned on 7/14 due to increased output of ostomy and requested patient return to hospital for evaluation.  He was a direct admit endorsing nausea otherwise no pain or fevers.  With urology consultation for urostomy evaluation. POA midline wound with wound VAC, RUQ ileostomy with stool, RLQ urostomy ileal conduit, bilateral nephrostomy tubes, and RLQ Blake surgery abdominal wall drain.  General surgery also consulted recommending continuing wound VAC and bowel regimen for ileostomy.  Admission labs creatinine 2.3. DVT prophylaxis Eliquis . Currently requiring min assist +2 safety/equipment for transfers and needs assistance for overall bed mobility.  Per chart review patient lives in a 1 level home with spouse. Therapy evaluations completed due to patient decreased functional mobility was admitted for a comprehensive rehab program. He complains of pain at ostomy site.    Hospital Course: Kristopher Thompson was admitted to rehab 05/27/2024 for inpatient therapies to consist of PT, ST and OT at least three hours five days a week. Past admission physiatrist, therapy team and rehab RN have worked together to provide customized collaborative inpatient rehab.  He remained stable on DVT prophylaxis Eliquis , history of PE. Hospital course complicated by severe hypotension and orthostasis which was managed by increasing Midodrine  to 10 mg TID along with Florinef , and ACE wraps and TED hose were applied. Despite not being a candidate for an  abdominal binder, his blood pressure showed some improvement. He should be followed closely in outpatient setting with repeat labs within 1 week. 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 Patient has multiple drains therefore WOC nursing consulted for complicated wound and drain management performing scheduled interventions. Wound vac maintained until discontinued on 7/22.  He was provided education and completed teaching for dressing changes and drains.  Patient to follow-up with wound ostomy clinic. Acute on chronic hyponatremia due to mild hypovolemia improved with IVF gentle hydration. Atarax  was continued for anxiety, and Zoloft  was added, enhancing his mood and helping control nausea. He was placed on a bowel regimen for ileostomy with fiber and supplemented with Juven, ProSource, and Ensure. Reglan  was administered for nausea, and his albumin  levels were closely monitored. Hemoglobin A1c 5.3. Blood sugars were monitored using sliding scale insulin  until stabilized, allowing for a regular diet and discontinuation of CBG checks. Diabetic teaching was completed. He received multimodal pain management, improving his pain control without the need for scheduled medications like Tylenol  and oxycodone . Fluid intake was encouraged at 1200 cc per day for AKI and CKD stage 3b, with improved creatinine levels after IV fluids, which were later reduced. He was advised to follow up with his primary care within a week for labs. For hyperlipidemia, he continued Crestor , and his BMI, indicative of stage 1 obesity, remained stable. During this stay, he experienced episodes of hypokalemia and hyponatremia and was monitored for hemoglobin drops, receiving transfusions as necessary  Rehab course: During patient's stay in rehab weekly team conferences were held to  monitor patient's progress, set goals and discuss barriers to discharge. At admission, patient required min assist +2 safety/equipment for transfers and needs assistance for overall bed mobility. Balance patient completed a variety of standing activities to promote increased balance strategies with ADL participation.  Patient continued to require frequent rest breaks due to fatigue.  Patient met all long-term goals for discharge overall supervision level. He required total assistance to don thigh-high TED hose.  Perform sit to stand with cues for hand placement and initiation.  The ambulatory x 100 feet with CGA with wheelchair following for patient's comfort.  Long term goals were met with PT showing improvement in balance,increased strength, decreased pain and functional use of right lower extremity and left lower extremity. Patient will benefit from ongoing skilled PT and OT outpatient.  Family teaching completed upon discharge to home with Kristopher Thompson at bedside.      Disposition:  Discharge disposition: 06-Home-Health Care Svc        Diet: Regular   Special Instructions:  -keep wound clean and dry, as directed, reinforce dressing PRN, and routine catheter care  -No driving smoking or alcohol  or illicit drug use    Discharge Instructions     Ambulatory referral to Wound Clinic   Complete by: As directed    Pre and post-operative counseling for ostomy management.  Help with  -midline wound w wound vac  -RUQ ileostomy w stool  -RLQ urostomy ileal conduit  -bilateral nephrostomy tubes  -RLQ Blake surgery abd wall  drain      Allergies as of 06/19/2024       Reactions   Demerol  [meperidine ] Nausea And Vomiting        Medication List     STOP taking these medications    atorvastatin 20 MG tablet Commonly known as: LIPITOR   bisacodyl  5 MG EC tablet Commonly known as: DULCOLAX   diphenhydrAMINE  25 mg capsule Commonly known as: BENADRYL    feeding supplement  (PRO-STAT SUGAR FREE 64) Liqd   ondansetron  4 MG tablet Commonly known as: Zofran    polyethylene glycol powder 17 GM/SCOOP powder Commonly known as: GLYCOLAX /MIRALAX        TAKE these medications    acetaminophen  325 MG tablet Commonly known as: TYLENOL  Take 1-2 tablets (325-650 mg total) by mouth every 4 (four) hours as needed for mild pain (pain score 1-3). What changed:  medication strength how much to take when to take this reasons to take this   cyclobenzaprine  5 MG tablet Commonly known as: FLEXERIL  Take 1 tablet (5 mg total) by mouth 3 (three) times daily as needed for muscle spasms.   cyproheptadine  4 MG tablet Commonly known as: PERIACTIN  Take 1 tablet (4 mg total) by mouth at bedtime.   Eliquis  5 MG Tabs tablet Generic drug: apixaban  Take 5 mg by mouth 2 (two) times daily.   feeding supplement Liqd Take 237 mLs by mouth 2 (two) times daily between meals. What changed: Another medication with the same name was removed. Continue taking this medication, and follow the directions you see here.   nutrition supplement (JUVEN) Pack Take 1 packet by mouth 2 (two) times daily between meals. What changed: Another medication with the same name was removed. Continue taking this medication, and follow the directions you see here.   FeroSul 325 (65 Fe) MG tablet Generic drug: ferrous sulfate  Take 1 tablet (325 mg total) by mouth 2 (two) times daily with a meal.   Fish Oil 1000 MG Caps Take 1,000 mg by mouth every Monday, Wednesday, and Friday.   fludrocortisone  0.1 MG tablet Commonly known as: FLORINEF  Take 2 tablets (0.2 mg total) by mouth daily.   Gerhardt's butt cream Crea Apply 1 Application topically 2 (two) times daily.   hydrOXYzine  25 MG tablet Commonly known as: ATARAX  Take 1 tablet (25 mg total) by mouth 3 (three) times daily as needed for anxiety.   ipratropium 0.03 % nasal spray Commonly known as: ATROVENT  Place 1 spray into both nostrils every 8  (eight) hours as needed (allergies).   leptospermum manuka honey Pste paste Apply 1 Application topically daily.   loperamide  2 MG capsule Commonly known as: IMODIUM  Take 2 capsules (4 mg total) by mouth 2 (two) times daily.   melatonin 3 MG Tabs tablet Take 1 tablet (3 mg total) by mouth at bedtime.   metFORMIN 500 MG tablet Commonly known as: GLUCOPHAGE Take 500 mg by mouth daily.   metoCLOPramide  5 MG tablet Commonly known as: REGLAN  Take 1 tablet (5 mg total) by mouth 3 (three) times daily before meals. What changed:  when to take this reasons to take this   midodrine  5 MG tablet Commonly known as: PROAMATINE  Take 3 tablets (15 mg total) by mouth 3 (three) times daily with meals. What changed: how much to take   Mintox Maximum Strength 400-400-40 MG/5ML suspension Generic drug: alum & mag hydroxide-simeth Take 15 mLs by mouth every 4 (four) hours as needed for indigestion.   MULTIVITAMIN PO Take 1 tablet by mouth daily.  oxyCODONE -acetaminophen  5-325 MG tablet Commonly known as: Percocet Take 2 tablets by mouth every 6 (six) hours as needed for severe pain (pain score 7-10). What changed:  how much to take additional instructions   polycarbophil 625 MG tablet Commonly known as: FIBERCON Take 1 tablet (625 mg total) by mouth 2 (two) times daily.   prochlorperazine  5 MG tablet Commonly known as: COMPAZINE  Take 1 tablet (5 mg total) by mouth every 6 (six) hours as needed for nausea.   psyllium 95 % Pack Commonly known as: HYDROCIL/METAMUCIL Take 1 packet by mouth daily.   rosuvastatin  10 MG tablet Commonly known as: CRESTOR  Take 1 tablet (10 mg total) by mouth every evening.   sertraline  100 MG tablet Commonly known as: ZOLOFT  Take 1 tablet (100 mg total) by mouth daily.   sodium chloride  0.65 % Soln nasal spray Commonly known as: OCEAN Place 1-2 sprays into both nostrils every 6 (six) hours as needed for congestion (dry nose).   vitamin B-12 500  MCG tablet Commonly known as: CYANOCOBALAMIN  Take 500 mcg by mouth every Monday, Wednesday, and Friday.   ZINC OXIDE EX Apply 1 application  topically See admin instructions. Cleanse sacrum with wound cleanser; pat dry; apply zinc oxide ointment and leave open to air. Every day shift for wound care.       ASK your doctor about these medications    levofloxacin  250 MG tablet Commonly known as: LEVAQUIN  Take 1 tablet (250 mg total) by mouth daily for 2 days. Ask about: Should I take this medication?        Follow-up Information     [name], MD Follow up.   Specialty: Internal Medicine Why: Call for an appointment 1-2 weeks Contact information: [address] [place] [address] [phone]         Thompson Standing, MD Follow up.   Specialties: General Surgery, Colon and Rectal Surgery Why: Call for an appointment. Contact information: [address] [place] [address] [phone]         [name], MD Follow up.   Specialty: Urology Why: Call for an appointment 1-2 weeks Contact information: [address] [place] [address] [phone]                 Signed: Daphne LITTIE Finders 06/17/2024, 6:13 PM

## 2024-06-01 NOTE — Consult Note (Signed)
 WOC Nurse wound follow up  WTA notified by bedside RN that [facility] has lost suction due to track pad disconnecting during therapy.  WTA presented to bedside, noted dressing is in tact but track pad is off.  New track pad applied to existing dressing and seal re-established.  Will plan to change full dressing per previous Tu/Fri schedule.  Thank you,  Doyal Polite, RN, MSN, [facility] WOC Team

## 2024-06-01 NOTE — Progress Notes (Addendum)
 Physical Therapy Session Note  Patient Details  Name: Kristopher Thompson MRN: [id number] Date of Birth: 1951/05/18  Today's Date: 06/01/2024 PT Individual Time: 1045-1200 PT Individual Time Calculation (min): 75 min   Missed Time: 50 minutes  Short Term Goals: Week 1:  PT Short Term Goal 1 (Week 1): = LTG  Skilled Therapeutic Interventions/Progress Updates:    Pt in bed when PT arrived, pt agreeable for session.  Pt continuous IV has been discharged.  Pt transferred supine to EOB CGA/Supervision.  Pt requires UE to pull self up using railing, mod indep sitting EOB. Sit->stand to RW with CGA, stand with RW->w/c Supervision.  Pt able to take small steps turning, good stand to sit transfers with supervision, following appropriate sequencing.  As pt was transferring to w/c, pt's abdominal wound vac connector fell off.  PT notified nursing tech.  Pt states his BP readings and dizziness are getting better, c/o dizziness from supine, however, resolving once in w/c.  Pt able to tolerate sitting upright in w/c until RN entered.  PT discussed safe mobility, therapeutic breaks, endurance, expected recovery process, challenges.    Second session: 1345-1500 (75 min) Pt in bed when PT arrived, pt agreeable for session.  Pt demonstrates good supine to sit transfers with supervision, slow and weak with vc for safety purposes. Once sitting EOB, pt states he felt tired.  Pt needed to rest before transferring to w/c with RW; supervision.  Pt wheeled self ~x45' max with UE/LE.  PT wheeled pt to day gym.  Pt performed standing ex's for balance NMR playing connect four x2 trials with L HHA on RW; second game with R HHA on RW.  Pt able to standing during entire game first attempt.  Pt rested in w/c until ready to ambulate back to room.  Pt ambulated with RW x110' with x2 rest breaks. Pt reports, I'm done and requested to return to room and into bed.  Pt transferred into bed, requires min assist for LE placement.  All  items within reach. Nsg notified to drain pt's bags per pt's request.      Therapy Documentation Precautions:  Precautions Precautions: Fall Recall of Precautions/Restrictions: Intact Precaution/Restrictions Comments: Urostomy/Colostomy, Ab wound vac, B nephrostomy drains Restrictions Weight Bearing Restrictions Per Provider Order: No General: PT Amount of Missed Time (min): 50 Minutes PT Missed Treatment Reason: Other (Comment) (pt's abdominal wound vac connection fell off)    Pain: Pt denies pain today.        Therapy/Group: Individual Therapy  [name] Fast 06/01/2024, 12:20 PM

## 2024-06-01 NOTE — Progress Notes (Signed)
 Occupational Therapy Session Note  Patient Details  Name: Kristopher Thompson MRN: [id number] Date of Birth: May 22, 1951  Today's Date: 06/01/2024 OT Individual Time: 0900-1000 OT Individual Time Calculation (min): 60 min    Short Term Goals: Week 1:  OT Short Term Goal 1 (Week 1): Pt will perform stand-step transfer with Min A + RW. OT Short Term Goal 2 (Week 1): Pt will thread LB garment with Min A + LRAD. OT Short Term Goal 3 (Week 1): Pt will tolerate >2 mins of standing activity for carryover in ADLs.  Skilled Therapeutic Interventions/Progress Updates:      Therapy Documentation Precautions:  Precautions Precautions: Fall Recall of Precautions/Restrictions: Intact Precaution/Restrictions Comments: Urostomy/Colostomy, Ab wound vac, B nephrostomy drains Restrictions Weight Bearing Restrictions Per Provider Order: No General: Pt supine in bed upon OT arrival, agreeable to OT session.  Pain:  2/10 pain reported in abdomen at nephrostomy tube sie, notified nsg and checked site, activity, intermittent rest breaks, distractions provided for pain management, pt reports tolerable to proceed.   ADL: OT providing skilled intervention on ADL retraining in order to increase independence with tasks and increase activity tolerance. Pt completed the following tasks at the current level of assist: Bed mobility: Mod A, assistance with BLE into bed for supine>EOB, SBA for EOB>supine with increased time Grooming/oral hygiene: SBA seated in W/C at sink for brushing teeth and washing face UB dressing: Min A, assistance with managing down back d/t lines seated in W/C Footwear: total A seated EOB UB Bathing: Min A seated at sink for UB, assistance for back Transfers: CGA for sit to stand and stand pivot transfer with RW  Exercises: OT providing skilled intervention for ADL/IADL tasks in ADL suite. OT instructing patient on unloading top rack of dishwasher into overhead cabinet while standing with  UE support at counter top in order to increase activity tolerance and standing balance. Pt completed at [facility] with PRN rest breaks d/t endurance.   Pt supine in bed with bed alarm activated, 2 bed rails up, call light within reach and 4Ps assessed.   Therapy/Group: Individual Therapy  Camie Hoe, OTD, OTR/L 06/01/2024, 12:59 PM

## 2024-06-02 DIAGNOSIS — R5381 Other malaise: Secondary | ICD-10-CM | POA: Diagnosis not present

## 2024-06-02 DIAGNOSIS — K631 Perforation of intestine (nontraumatic): Secondary | ICD-10-CM | POA: Diagnosis not present

## 2024-06-02 LAB — GLUCOSE, CAPILLARY
Glucose-Capillary: 90 mg/dL (ref 70–99)
Glucose-Capillary: 105 mg/dL — ABNORMAL HIGH (ref 70–99)
Glucose-Capillary: 93 mg/dL (ref 70–99)
Glucose-Capillary: 113 mg/dL — ABNORMAL HIGH (ref 70–99)

## 2024-06-02 MED ORDER — DIPHENOXYLATE-ATROPINE 2.5-0.025 MG PO TABS: 1.0000 | ORAL_TABLET | Freq: Four times a day (QID) | ORAL | Status: DC | PRN

## 2024-06-02 MED ORDER — SODIUM CHLORIDE 0.9 % IV BOLUS: 1000.0000 mL | Freq: Three times a day (TID) | INTRAVENOUS | Status: DC | PRN

## 2024-06-02 MED ORDER — SODIUM CHLORIDE 0.9 % IV BOLUS: 1000.0000 mL | Freq: Three times a day (TID) | INTRAVENOUS | Status: AC | PRN

## 2024-06-02 MED ORDER — SODIUM CHLORIDE 0.9% FLUSH: 3.0000 mL | INTRAVENOUS | Status: DC | PRN

## 2024-06-02 MED ORDER — CYPROHEPTADINE HCL 4 MG PO TABS
4.0000 mg | ORAL_TABLET | Freq: Every day | ORAL | Status: DC
  Administered 2024-06-02 – 2024-06-18 (×17): 4 mg via ORAL
  Filled 2024-06-02 (×18): qty 1

## 2024-06-02 MED ORDER — ACETAMINOPHEN 500 MG PO TABS
500.0000 mg | ORAL_TABLET | Freq: Two times a day (BID) | ORAL | Status: DC
  Administered 2024-06-02 – 2024-06-19 (×32): 500 mg via ORAL
  Filled 2024-06-02 (×33): qty 1

## 2024-06-02 MED ORDER — PSYLLIUM 95 % PO PACK
1.0000 | PACK | Freq: Every day | ORAL | Status: DC
  Administered 2024-06-04 – 2024-06-12 (×2): 1 via ORAL
  Filled 2024-06-02 (×11): qty 1

## 2024-06-02 MED ORDER — SODIUM CHLORIDE 0.9% FLUSH
3.0000 mL | Freq: Two times a day (BID) | INTRAVENOUS | Status: DC
  Administered 2024-06-03 – 2024-06-15 (×16): 3 mL via INTRAVENOUS

## 2024-06-02 MED ORDER — SODIUM CHLORIDE 0.9 % IV SOLN: 250.0000 mL | INTRAVENOUS | Status: DC | PRN

## 2024-06-02 MED ORDER — DIPHENOXYLATE-ATROPINE 2.5-0.025 MG PO TABS
2.0000 | ORAL_TABLET | Freq: Two times a day (BID) | ORAL | Status: DC
  Administered 2024-06-02 – 2024-06-19 (×33): 2 via ORAL
  Filled 2024-06-02 (×27): qty 2

## 2024-06-02 NOTE — Progress Notes (Signed)
 Physical Therapy Session Note  Patient Details  Name: Kristopher Thompson MRN: [id number] Date of Birth: 08-Oct-1951  Today's Date: 06/02/2024 PT Individual Time: 1300-1400 PT Individual Time Calculation (min): 60 min   Short Term Goals: Week 1:  PT Short Term Goal 1 (Week 1): = LTG  Skilled Therapeutic Interventions/Progress Updates:    Pt in bed when PT arrived, pt agreeable for PT.  Pt able to transfer from supine position to EOB with railing for support. In sitting, PT noticed pt's shorts were drench from abdominal wound.  Nsg  notified and performed wound change.  Pt able to stand with RW light CGA and support self while nsg performed lower body clothing change.  Pt requested to lie down, however, PT encouraged pt to stand during activity.  Pt was taken to gym to conserve energy/time.  Pt performed RW standing balance/endurance activity with bean bag throws x2 trials. Pt able to stand without resting total of 3' 30.  Pt fatigued post session, did not c/o pain and taken back to room to sit upright in w/c until next session. Wife present during visit, all items within reach.    Therapy Documentation    Pain: pt denies pain       Therapy/Group: Individual Therapy  [name] Fast 06/02/2024, 4:32 PM

## 2024-06-02 NOTE — Progress Notes (Signed)
 Physical Therapy Session Note  Patient Details  Name: Kristopher Thompson MRN: [id number] Date of Birth: Aug 23, 1951  Today's Date: 06/02/2024 PT Individual Time: 1445-1540 PT Individual Time Calculation (min): 55 min  and Today's Date: 06/02/2024 PT Missed Time: 20 Minutes Missed Time Reason: Patient fatigue;Patient unwilling to participate  Short Term Goals: Week 1:  PT Short Term Goal 1 (Week 1): = LTG  Skilled Therapeutic Interventions/Progress Updates:     Pt received seated in [facility] and agrees to therapy. Reports pain in buttocks from sitting in chair. PT provides gentle mobility and repositioning to manage pain. WC transport to gym for time management. Pt performs sit to stand with CGA and cues for initiation and sequencing. Pt ambulates x60' with RW and CGA, with cues for upright gaze to improve posture and balance, and increasing stride length to decrease risk for falls. Pt requires extended seated rest break. Pt then stands and ambulates x70' with RW and CGA, with cues to decresae WB through RW for energy conservation. Pt visibly and verbally fatigued following ambulation. Following rest break, pt completes kinetron for endurance training and reciprocal coordination. Pt completes x10:00 with cues for NM feedback and correct performance. Pt reports significant fatigue following activity and requests to return to bed. WC transport back to room. Stand step back to bed with minA and RW. Pt performs sit to supine with minA and cues for positioning. Left supine with alarm intact and all needs within reach. Pt misses 20 minutes of skilled PT.   Therapy Documentation Precautions:  Precautions Precautions: Fall Recall of Precautions/Restrictions: Intact Precaution/Restrictions Comments: Urostomy/Colostomy, Ab wound vac, B nephrostomy drains Restrictions Weight Bearing Restrictions Per Provider Order: No   Therapy/Group: Individual Therapy  Elsie JAYSON Dawn, PT, DPT 06/02/2024, 5:01 PM

## 2024-06-02 NOTE — Progress Notes (Incomplete)
 Physical Therapy Session Note  Patient Details  Name: Kristopher Thompson MRN: [id number] Date of Birth: 1951-05-10  Today's Date: 06/02/2024 PT Individual Time: 1300-1400 PT Individual Time Calculation (min): 60 min   Short Term Goals: Week 1:  PT Short Term Goal 1 (Week 1): = LTG  Skilled Therapeutic Interventions/Progress Updates:      Therapy Documentation Precautions:  Precautions Precautions: Fall Recall of Precautions/Restrictions: Intact Precaution/Restrictions Comments: Urostomy/Colostomy, Ab wound vac, B nephrostomy drains Restrictions Weight Bearing Restrictions Per Provider Order: No    Pain: pt denies pain      Therapy/Group: Individual Therapy  Kristopher Thompson Fast 06/02/2024, 4:34 PM

## 2024-06-02 NOTE — Progress Notes (Signed)
 PROGRESS NOTE   Subjective/Complaints:  Pt reports ate 1 thing of cheerios and 1/2 milk and 1/2 banana- just everything tastes weird and has no appetite- even food his wife brings in form home doesn't taste good.   Doesn't want more meds, but frustrated taking so many pills.  They had to change his VAC yesterday- lost suction/seal and wouldn't stay on.   Still has moderate output from 1 drain left.  IV is also leaking per pt- had to put towel under it  ROS:    Pt denies SOB, abd pain, CP, N/V/C/D, and vision changes   Objective:   No results found. Recent Labs    06/01/24 0522  WBC 6.7  HGB 10.7*  HCT 32.1*  PLT 201   Recent Labs    05/31/24 0551 06/01/24 0522  NA 135 136  K 3.6 3.7  CL 111 114*  CO2 16* 14*  GLUCOSE 84 92  BUN 26* 20  CREATININE 1.10 1.08  CALCIUM  8.8* 8.6*    Intake/Output Summary (Last 24 hours) at 06/02/2024 9178 Last data filed at 06/02/2024 0600 Gross per 24 hour  Intake 1560 ml  Output 2375 ml  Net -815 ml        Physical Exam: Vital Signs Blood pressure 103/60, pulse 84, temperature 98.7 F (37.1 C), temperature source Oral, resp. rate 18, height 5' 11 (1.803 m), weight 92.5 kg, SpO2 98%.   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 Functional deficits which require 3+ hours per day of interdisciplinary therapy in a comprehensive inpatient rehab  setting. Physiatrist is providing close team supervision and 24 hour management of active medical problems listed below. Physiatrist and rehab team continue to assess barriers to discharge/monitor patient progress toward functional and medical goals  Care Tool:  Bathing    Body parts bathed by patient: Right arm, Left arm, Chest, Abdomen, Front perineal area, Right upper leg, Left upper leg, Face   Body parts bathed by helper: Buttocks, Right lower leg, Left lower leg     Bathing assist Assist Level: Minimal Assistance - Patient > 75%     Upper Body Dressing/Undressing Upper body dressing   What is the patient wearing?: Pull over shirt    Upper body assist Assist Level: Set up assist    Lower Body Dressing/Undressing Lower body dressing      What is the patient wearing?: Underwear/pull up     Lower body assist Assist for lower body dressing: Maximal Assistance - Patient 25 - 49%     Toileting Toileting Toileting Activity did not occur (Clothing management and hygiene only): N/A (no void or bm) (Urostomy/Colostomy)  Toileting  assist       Transfers Chair/bed transfer  Transfers assist     Chair/bed transfer assist level: Supervision/Verbal cueing     Locomotion Ambulation   Ambulation assist      Assist level: Contact Guard/Touching assist Assistive device: Walker-rolling Max distance: 110'   Walk 10 feet activity   Assist  Walk 10 feet activity did not occur: Safety/medical concerns (pt too weak, fatigue)  Assist level: Contact Guard/Touching assist Assistive device: Walker-rolling   Walk 50 feet activity   Assist Walk 50 feet with 2 turns activity did not occur: Safety/medical concerns (pt too weak, fatigue)  Assist level: Contact Guard/Touching assist Assistive device: Walker-rolling    Walk 150 feet activity   Assist Walk 150 feet activity did not occur: Safety/medical concerns (pt too weak, fatigue)         Walk 10 feet on  uneven surface  activity   Assist Walk 10 feet on uneven surfaces activity did not occur: Safety/medical concerns (pt too weak, fatigue)         Wheelchair     Assist Is the patient using a wheelchair?: Yes Type of Wheelchair: Manual    Wheelchair assist level: Supervision/Verbal cueing Max wheelchair distance: 45    Wheelchair 50 feet with 2 turns activity    Assist    Wheelchair 50 feet with 2 turns activity did not occur:  (pt too weak, fatigue)   Assist Level: Dependent - Patient 0%   Wheelchair 150 feet activity     Assist  Wheelchair 150 feet activity did not occur:  (pt too weak, fatigue)   Assist Level: Dependent - Patient 0%   Blood pressure 103/60, pulse 84, temperature 98.7 F (37.1 C), temperature source Oral, resp. rate 18, height 5' 11 (1.803 m), weight 92.5 kg, SpO2 98%.  Medical Problem List and Plan: 1. Functional deficits secondary to debility 2/2 perforated bowel             -patient may not shower due to wound vac             -ELOS/Goals: 10-14 days S             Admit to CIR             Con't CIR- PT an dOT  Team conference today to determine length of stay  HOH makes it harder for pt to understand plan/discuss issues.  2.  Antithrombotics: -DVT/anticoagulation:  Mechanical:  Antiembolism stockings, knee (TED hose) Bilateral lower extremities             -antiplatelet therapy: continue Eliquis  3. Pain Management: continue Scheduled Tylenol ; Oxycodone  as needed took 30mg  Oxy IR yesterday , 20mg  thus far today    7/17- took tylenol  this AM- pain down to 2-3/10- will con't regimen and monitor if needs more with therapy  7/22- Will change tylenol  to 500 mg BID and con't oxy prn 4. Mood/Behavior/Sleep: LCSW to follow for evaluation and support when available.              -antipsychotic agents: N/A             -anxiety: continue Atarax  25 mg  5. Neuropsych/cognition: This patient is capable of making decisions on his own behalf.    6. Complicated wound: WOC nurse consultation             - Continue wound VAC             Zosyn  d/ced as per surgery  recommendation   7/17- con't VAC- WOC to change- Tuesday and Friday per WOC note 7. Fluids/Electrolytes/Nutrition: Monitor I's & O's--Recheck monitor labs routinely CBC/CMP              - Bowel regimen for ileostomy: Continue FiberCon and loperamide  twice daily Goal is [phone] mL out fecal ileostomy a day              - Carb/cardiac diet -continue Juven/Prosource/Ensure supplements              -Nausea/vomiting:  seems to have improved- PRN orders Reglan  and Zofran    8. AKI/CKD stage IIIb: Creatinine improving 2.3 now 1.91 -monitor BMP   7/17- Cr 2.05- on IVF's 125/cc/hour- will continue for now and recheck labs in AM             - On continuous IVF NS 125 mL/hr   7/18- Will reduce IVFs to 75cc/hour-     Latest Ref Rng & Units 06/01/2024    5:22 AM 05/31/2024    5:51 AM 05/29/2024    5:53 AM  BMP  Glucose 70 - 99 mg/dL 92  84  89   BUN 8 - 23 mg/dL 20  26  50   Creatinine 0.61 - 1.24 mg/dL 8.91  8.89  8.62   Sodium 135 - 145 mmol/L 136  135  134   Potassium 3.5 - 5.1 mmol/L 3.7  3.6  4.1   Chloride 98 - 111 mmol/L 114  111  108   CO2 22 - 32 mmol/L 14  16  17    Calcium  8.9 - 10.3 mg/dL 8.6  8.8  9.1     2/79- reduce IVF to 50cc/hr Recent ECHO showed normal Ej fx, no concerns or signs of fluid overload at this time   7/22- will stop IVF's- IV is leaking and pt wants to try off it.  9. DM type II: 04/01/24 Hemoglobin A1c 5.3  -Monitor BS AC/at bedtime and use SSI 7/17- will change to regular diet and try to have pt make good choices- since poor intake- CBG's low 7/18- BG's 84-125- well controlled- con't regimen- probably low due to poor intake.  10.  Severe Hypotension: Order orthostatic vitals              - teds ordered             -increase midodrine  to 10mg  TID   7/17- BP running 80s systolic and feels lightheaded- Midodrine  was increased yesterday to 10 mg TID- if  doesn't improve today, will order ACE wraps as well as Thigh high TED's- cannot do abd binder due to abd drains, VAC and ostomies- if this doesn't improve in next 24 hours, will add Florinef .  Also liberalized his diet to regular diet so can have some salt to help.   7/18- will add Florinef  0.1 mg daily- advised therapy to add ACE wraps- wasn't started yesterday- pt IS symptomatic- will also continue IVFs for now Vitals:   06/01/24 1957 06/02/24 0437  BP: 107/62 103/60  Pulse: 88 84  Resp:  18  Temp: 98.7 F (37.1 C) 98.7 F (37.1 C)  SpO2: 99% 98%  Overall improved since 7/16 but remains soft cont IVF but at lower rate   7/22- Stopped IVFs since IV is leaking- pt wants ot get rid of IVF's- pt needs to drink 6-8 cups of milk juice or water /day- if labs get worse on Thursday, will restart IVFs. BP's running low 100's systolic- better than  before.  11. Stage I Obesity: BMI 31.49 kg/m  -educate on diet and weight loss to promote overall health and mobility.   7/17- BMI down to 28.44 7/22- BMI 28.44 12. Hyperlipidemia: Continue Crestor    13. Screening for vitamin D  deficiency:check vitamin D  level tomorrow 14. Hyponatremia  7/17- Na down to 128- but is on IVF's- hard to fluid restrict due to Renal issues, so will keep on IVF's and recheck in AM  7/18- Na up to 134- will not intervene more  7/22- Na up to 136 15. Poor appetite, nausea- frequent  7/18- will schedule low dose Compazine  before meals TID due to frequent nausea impacting his ability to eat meals. 7/22- still has severe loss of appetite, even though nausea is doing somewhat better- will Add Periacin 4 mg at bedtime- cannot add Remeron due to prolonged QT interval     I spent a total of 52    minutes on total care today- >50% coordination of care- due to  review of labs; vitals, output-  Nephrostomy drain put out 375cc overnight- not appropriate to get taken out.  Also d/w pt and wife about options to help with intake and we  compromised that will take out IV and stop IVFs but he needs ot drink 6-8 cups of water /milk/juice/day- Coke doesn't count.    LOS: 6 days A FACE TO FACE EVALUATION WAS PERFORMED  Rebbecca Osuna 06/02/2024, 8:21 AM

## 2024-06-02 NOTE — Progress Notes (Signed)
 Occupational Therapy Session Note  Patient Details  Name: Kristopher Thompson MRN: [id number] Date of Birth: December 23, 1950  Today's Date: 06/02/2024 OT Individual Time: 0915-1030 OT Individual Time Calculation (min): 75 min    Short Term Goals: Week 1:  OT Short Term Goal 1 (Week 1): Pt will perform stand-step transfer with Min A + RW. OT Short Term Goal 2 (Week 1): Pt will thread LB garment with Min A + LRAD. OT Short Term Goal 3 (Week 1): Pt will tolerate >2 mins of standing activity for carryover in ADLs.  Skilled Therapeutic Interventions/Progress Updates:      Therapy Documentation Precautions:  Precautions Precautions: Fall Recall of Precautions/Restrictions: Intact Precaution/Restrictions Comments: Urostomy/Colostomy, Ab wound vac, B nephrostomy drains Restrictions Weight Bearing Restrictions Per Provider Order: No General:  Pt supine in bed upon OT arrival, agreeable to OT session. Wife present. Wound vac removed and IV removed at start of session.   Vital Signs: OT completing orthostatic vitals as follows with knee high TEDs Supine- 104/62 EOB-114/68  Standing-86/59, asymptomatic   Pain: no pain reported   ADL: OT providing skilled intervention on ADL retraining in order to increase independence with tasks and increase activity tolerance. Pt completed the following tasks at the current level of assist: Bed mobility: SBA with HOB raised Grooming/oral hygiene: SBA in W/C seated at sink, able to reach out of BOS to retrieve items on sink LB dressing: total A with wife's assistance at bed level  Footwear: total A for TEDs and ACE wraps seated in W/C Transfers: CGA stand pivot from EOB>W/C with RW   Other Treatments: OT providing skilled intervention with functional mobility training and increasing activity tolerance. Pt completing 2 bouts of mobility, 45 ft and 47 ft with RW and 1 seated rest break, no LOB/SOB although fatigued after ambulating.   Pt seated in W/C at  end of session with W/C alarm donned, call light within reach and 4Ps assessed. Wife present.    Therapy/Group: Individual Therapy  Camie Hoe, OTD, OTR/L 06/02/2024, 1:39 PM

## 2024-06-02 NOTE — Progress Notes (Addendum)
 06/02/2024  Kristopher Thompson [id number] [birth year]  CARE TEAM: PCP: [name], MD  Outpatient Care Team: Patient Care Team: [name], MD as PCP - General (Internal Medicine) [name], MD (Inactive) as Consulting Physician (Dermatology) [name], MD as Referring Physician (Pulmonary Disease) [name], MD as Consulting Physician (General Surgery) [name], MD as Consulting Physician (Urology)  Inpatient Treatment Team: Treatment Team:  Lovorn, Megan, MD Leonar, Angelina G, RN Leak, Daphne CROME, NP Misa, Arland RAMAN, PT Dupree, Asberry MATSU, LCSW Irven Doyal HERO, RN [name], MD [name], MD Creasie Camie MATSU, OT Sebastian Charmaine FORBES, LPN Genaro Zebedee BIRCH, [facility]   Problem List:   Principal Problem:   Debility Active Problems:   Adjustment disorder with depressed mood   04/10/2024  POST-OPERATIVE DIAGNOSIS:  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   PROCEDURE:   -ROBOTIC LYSIS OF ADHESIONS X 4 HOURS -COMPONENT SEPARATION - TRANSVERSUS ABDOMINIS REALEASE (TAR) BILATERAL -ROBOTIC REPAIRS OF  INCISIONAL, PARASTOMAL, LEFT INGUINAL  INCARCERATED ABDOMINAL WALL HERNIAS WITH MESH -UROSTOMY ILEAL CONDUIT REVISION -INTRAOPERATIVE ASSESSMENT OF TISSUE VASCULAR PERFUSION USING ICG (indocyanine green ) -IMMUNOFLUORESCENCE -TRANSVERSUS ABDOMINIS PLANE (TAP) BLOCK - BILATERAL    SURGEON:  [name], MD   04/20/2024 POST-OPERATIVE DIAGNOSIS:  perforated bowel   PROCEDURE:  Drainage of right abdominal wall abscess as well as intra-abdominal abscess Explantation of abdominal mesh Small bowel resection (in discontinuity) Placement of ABThera wound VAC   SURGEON:  [name], MD  04/21/2024  POST-OPERATIVE DIAGNOSIS:  DELAYED BOWEL PERFORATION WITH OPEN ABDOMEN   PROCEDURE:   LYSIS OF ADHESIONS X ABDOMINAL WALL DEBRIDEMENT ILEAL RESECTION END ILEOSTOMY ABDOMINAL WALL PARASTOMAL & INCISIONAL HERNIA REPAIRS  WITH PHASIX MESH FASCIA CLOSURE WITH WOUND VAC PLACEMENT IN SQ   SURGEON:  [name], MD    05/04/2024 Interventional Radiology Procedure Note   Procedure: Bilateral percutaneous nephrostomy tube placements   Findings: Please refer to procedural dictation for full description. 10 Fr bilateral to bag drainage.   Complications: None immediate   Estimated Blood Loss: < 5 ml   Recommendations: Keep to bag drainage.  IR will follow.  Ultimate management as per Urology.   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  Adjusted 6/30, 7/1, 7/3 with stent going from kidney out urostomy across the presumed mid ileal on the right side leak.  Urine draining into urostomy and right lower quadrant surgical Blake drain in the abdominal wall resting just outside the ileal conduit.  Nephrostomy tubes & stenting per [name] Urology.    Abdominal wall Blake drain creatinine same as serum on check 7/10 and 7/15 reassuring.  Another body fluid was sent off 7/18 that shows elevation which is concerning.  Confusing.  Will send again 1 more time just to be sure.  Defer to Dr. Renda.  I believe he is planning follow-up in a few weeks for repeat studies and testing and consider capping/adjusting/removing drains over time.  Perhaps can consider capping trial since output seems to be mainly from urostomy now.  I would keep abdominal wall drain around the ileal conduit for last as a marker to  make sure that the urine leak remains resolved.    If not  controlled with urinary diversion and stenting, he may require revision of his ileal conduit.  That would be very high risk.  Understand they wish to hold off but do not want to have nephrostomy tubes indefinitely either if possible.  Defer to Dr. Renda urology with interventional radiology   Serum BUN & creatinine slowly coming down.  Likelihood of repeat dialysis seems rather low.  Nephrology following more peripherally  Nutrition:  PO Diet as tolerated.  Regular diet.  Patient's been struggling with decreased appetite and intermittent nausea.  I had written for metoclopramide  as needed.  I believe they are doing a trial of Compazine  for now.  Defer to rehab/medicine    Agree with nutrition consultation and supplements.  Check prealbumin.  Was in the normal range at 38.  High output ileostomy.  Challenge and malnourished patient.  Output remains rather thin despite loperamide .    Patient does not like to take FiberCon refuses.    SwitchED to psyllium and Lomotil  for different regimen 7/22 and see if that works better.    I added iron  p.o. which can help constipate as well starting 7/21.  Stop IV fluids since BUN/creatinine better.  Usually switch to intermittent boluses.  Some people benefit from PICC line and IV fluids 1 L LR normal saline Monday Wednesday Friday x 6 weeks.  PICC line is out, so hold off on replacing unless cannot control.     Infection due to delayed bowel perforation and abdominal wall infection/necrosis  History of growing multiple organisms including Candida albicans, Enterobacter, and Enterococcus.    IV Zosyn /micafungin  x6 weeks, completed 7/7.    I see no evidence of infection.  I see no reason to do IV antibiotics.  Midline incision wound     Wound very superficial now with good granulation.  10 x 4 x .3 cm depth.    Do not feel patient needs to keep wound VAC especially states they have been having leaking and mobility issues with it.  I DC'd  7/22   switch to just packing once a day.  I would recommend using saline gel (K-Y jelly) 1 inch stripe on the gauze and lay it there for some mild bacteria stasis but not letting the wound stay soggy.  Change packing once a day.  Let it tract and close down/epithelialize over.      Metabolic encephalopathy resolved.  Follow.  Insomnia improved  ABLA on top of anemia of chronic disease improved with transfusion and iron  supplementation    -monitor electrolytes & replace as needed.  Keep K>4, Mg>2, Phos>3.    -Diabetes.  Sliding scale insulin .    -VTE prophylaxis-  Full anticoagulation given history of pulmonary embolism.  Eliquis .  -Mobilize as tolerated.  That is starting to happen with the help of inpatient rehab external encouraged him to get out of bed to rehab more.    I updated the patient's status to the the patient, wife, and nurses.  Recommendations were made.  Questions were answered.  They expressed understanding & appreciation.  -Disposition:    I reviewed nursing notes, last 24 h vitals and pain scores, last 48 h intake and output, last 24 h labs and trends, and last 24 h imaging results.  I have reviewed this patient's available data, including medical history, events of note, test results, etc as part of my evaluation.   A significant portion of that time  was spent in counseling. Care during the described time interval was provided by me.  This care required high  level of medical decision making.  06/02/2024    Subjective: (Chief complaint)  Patient feeling more alert.  Wife at bedside.  Working with rehab more.  Still not much of an appetite.  Still struggles with some nausea.  Not asking for metoclopramide .  Cord with wound VAC popped off twice this past week.      Objective:  Vital signs:  Vitals:   06/01/24 0441 06/01/24 1323 06/01/24 1957 06/02/24 0437  BP: 110/61 (!) 104/59 107/62 103/60  Pulse: 91 87 88 84  Resp: 16 18  18   Temp: 98.7 F  (37.1 C) 98.6 F (37 C) 98.7 F (37.1 C) 98.7 F (37.1 C)  TempSrc: Oral  Oral Oral  SpO2: 98% 100% 99% 98%  Weight:      Height:        Last BM Date : 06/01/24  Intake/Output   Yesterday:  07/21 0701 - 07/22 0700 In: 1560 [P.O.:1560] Out: 2375 [Urine:1150; Drains:250; Stool:975] This shift:  No intake/output data recorded.  Bowel function:  Flatus: YES  BM:  YES  Drains:  RLQ surgical Blake drain to biliary gravity bag drain (rests between abdominal wall and Phasix mesh): Some old fat deposits I milked.  Thinly serous  R & L back nephrostomy tubes in place.  - light yellow-colored -scant  Physical Exam:  General: Resting in no acute distress.  Legs crossed looking relaxed.  Baseline of being stoic but interactive.  Actually more chatty today..   Eyes: Glasses PERRL, normal EOM.  Sclera clear.  No icterus Neuro: CN II-XII intact w/o focal sensory/motor deficits. Lymph: No head/neck/groin lymphadenopathy Psych:  No delerium/psychosis/paranoia.  No agitation HENT: Normocephalic, Mucus membranes moist.  No thrush.  Mildly hard of hearing Neck: Supple, No tracheal deviation.  No obvious thyromegaly Chest: No pain to chest wall compression.  Good respiratory excursion.   CV:  Pulses intact.  Regular rhythm.  No extremity edema MS:  No obvious deformity  Abdomen:  Obese Soft.  Nondistended.  Mild soreness around abdominal surgical Blake drain.  Some erythema/ecchymosis along right flank consistent with end ileostomy stomal dermatitis/leaking.  Not consistent with cellulitis  Wound vac (in SQ over closed fascia) in midline.    RUQ: (End ileostomy): Pink mucosa.  Thin dark brown effluent in bag RLQ:  (Urostomy ileal conduit): Pink mucosa without any obvious separation.  Large volume of clear yellow urine output.  Stent coming out of ostomy  Ext:   No deformity.  No edema.  No cyanosis Skin: No petechiae / purpurea.  No major sores.  Warm and dry    Results:    Cultures: Recent Results (from the past 720 hours)  Culture, blood (Routine X 2) w Reflex to ID Panel     Status: None   Collection Time: 05/26/24 12:28 AM   Specimen: BLOOD LEFT ARM  Result Value Ref Range Status   Specimen Description BLOOD LEFT ARM  Final   Special Requests   Final    BOTTLES DRAWN AEROBIC AND ANAEROBIC Blood Culture adequate volume   Culture   Final    NO GROWTH 5 DAYS Performed at [facility], [address], [place], [place] [address]    Report Status 05/31/2024 FINAL  Final  Culture, blood (Routine X 2) w Reflex to ID Panel     Status: None   Collection Time: 05/26/24 12:53 AM  Specimen: BLOOD RIGHT HAND  Result Value Ref Range Status   Specimen Description BLOOD RIGHT HAND  Final   Special Requests   Final    BOTTLES DRAWN AEROBIC AND ANAEROBIC Blood Culture adequate volume   Culture   Final    NO GROWTH 5 DAYS Performed at [facility], [address], [place], [place] [address]    Report Status 05/31/2024 FINAL  Final     Labs: Results for orders placed or performed during the hospital encounter of 05/27/24 (from the past 48 hours)  Glucose, capillary     Status: Abnormal   Collection Time: 05/31/24 11:47 AM  Result Value Ref Range   Glucose-Capillary 105 (H) 70 - 99 mg/dL    Comment: Glucose reference range applies only to samples taken after fasting for at least 8 hours.  Glucose, capillary     Status: None   Collection Time: 05/31/24  4:43 PM  Result Value Ref Range   Glucose-Capillary 93 70 - 99 mg/dL    Comment: Glucose reference range applies only to samples taken after fasting for at least 8 hours.  Glucose, capillary     Status: None   Collection Time: 05/31/24  8:55 PM  Result Value Ref Range   Glucose-Capillary 83 70 - 99 mg/dL    Comment: Glucose reference range applies only to samples taken after fasting for at least 8 hours.  Basic metabolic panel     Status: Abnormal   Collection Time: 06/01/24  5:22 AM  Result  Value Ref Range   Sodium 136 135 - 145 mmol/L   Potassium 3.7 3.5 - 5.1 mmol/L   Chloride 114 (H) 98 - 111 mmol/L   CO2 14 (L) 22 - 32 mmol/L   Glucose, Bld 92 70 - 99 mg/dL    Comment: Glucose reference range applies only to samples taken after fasting for at least 8 hours.   BUN 20 8 - 23 mg/dL   Creatinine, Ser 8.91 0.61 - 1.24 mg/dL   Calcium  8.6 (L) 8.9 - 10.3 mg/dL   GFR, Estimated >39 >39 mL/min    Comment: (NOTE) Calculated using the CKD-EPI Creatinine Equation (2021)    Anion gap 8 5 - 15    Comment: Performed at [facility], [address], [place], [place] [address]  CBC with Differential/Platelet     Status: Abnormal   Collection Time: 06/01/24  5:22 AM  Result Value Ref Range   WBC 6.7 4.0 - 10.5 K/uL   RBC 3.58 (L) 4.22 - 5.81 MIL/uL   Hemoglobin 10.7 (L) 13.0 - 17.0 g/dL   HCT 67.8 (L) 60.9 - 47.9 %   MCV 89.7 80.0 - 100.0 fL   MCH 29.9 26.0 - 34.0 pg   MCHC 33.3 30.0 - 36.0 g/dL   RDW 85.1 88.4 - 84.4 %   Platelets 201 150 - 400 K/uL   nRBC 0.0 0.0 - 0.2 %   Neutrophils Relative % 62 %   Neutro Abs 4.1 1.7 - 7.7 K/uL   Lymphocytes Relative 18 %   Lymphs Abs 1.2 0.7 - 4.0 K/uL   Monocytes Relative 10 %   Monocytes Absolute 0.7 0.1 - 1.0 K/uL   Eosinophils Relative 8 %   Eosinophils Absolute 0.6 (H) 0.0 - 0.5 K/uL   Basophils Relative 1 %   Basophils Absolute 0.1 0.0 - 0.1 K/uL   Immature Granulocytes 1 %   Abs Immature Granulocytes 0.09 (H) 0.00 - 0.07 K/uL  Comment: Performed at [facility], [address] [place], [place] [address]  Glucose, capillary     Status: None   Collection Time: 06/01/24  6:07 AM  Result Value Ref Range   Glucose-Capillary 84 70 - 99 mg/dL    Comment: Glucose reference range applies only to samples taken after fasting for at least 8 hours.  Glucose, capillary     Status: Abnormal   Collection Time: 06/01/24 11:29 AM  Result Value Ref Range   Glucose-Capillary 132 (H) 70 - 99 mg/dL    Comment: Glucose  reference range applies only to samples taken after fasting for at least 8 hours.  Glucose, capillary     Status: Abnormal   Collection Time: 06/01/24  4:49 PM  Result Value Ref Range   Glucose-Capillary 100 (H) 70 - 99 mg/dL    Comment: Glucose reference range applies only to samples taken after fasting for at least 8 hours.  Glucose, capillary     Status: None   Collection Time: 06/01/24  9:08 PM  Result Value Ref Range   Glucose-Capillary 96 70 - 99 mg/dL    Comment: Glucose reference range applies only to samples taken after fasting for at least 8 hours.  Glucose, capillary     Status: None   Collection Time: 06/02/24  5:47 AM  Result Value Ref Range   Glucose-Capillary 90 70 - 99 mg/dL    Comment: Glucose reference range applies only to samples taken after fasting for at least 8 hours.    Imaging / Studies: No results found.        Medications / Allergies: per chart  Antibiotics: Anti-infectives (From admission, onward)    Start     Dose/Rate Route Frequency Ordered Stop   05/27/24 2200  piperacillin -tazobactam (ZOSYN ) IVPB 3.375 g  Status:  Discontinued        3.375 g 12.5 mL/hr over 240 Minutes Intravenous Every 8 hours 05/27/24 1505 05/27/24 1727         Note: Portions of this report may have been transcribed using voice recognition software. Every effort was made to ensure accuracy; however, inadvertent computerized transcription errors may be present.   Any transcriptional errors that result from this process are unintentional.    Elspeth KYM Schultze, MD, FACS, MASCRS Esophageal, Gastrointestinal & Colorectal Surgery Robotic and Minimally Invasive Surgery  [facility] [address] [place], [place] [address] [phone] Fax [phone] Main  CONTACT INFORMATION: Weekday (9AM-5PM): Call [facility] main office at [phone] Weeknight (5PM-9AM) or Weekend/Holiday: Check EPIC Web Links tab &  use AMION (password  [email or web address]) for General Surgery [facility] coverage  Please, DO NOT use SecureChat  (it is not reliable communication to reach operating surgeons & will lead to a delay in care).   Epic staff messaging available for outptient concerns needing 1-2 business day response.      06/02/2024  8:33 AM

## 2024-06-02 NOTE — Consult Note (Signed)
 WOC Nurse wound follow up:  WTA noted NPWT has been discontinued by Dr Sheldon this am with switch to wet to dry. WOC team will not follow patient for wound care.  WOC Nurse ostomy follow up Stoma type/location: RLQ ileal conduit, RUQ ileostomy Stomal assessment/size:   RLQ ileal conduit: oval 1 in x 1 1/4 inch, flush with skin, in valley RUQ ileostomy: 1 1/4 inch oval, abd creases at 3 o'clock and 9 o'clock Peristomal assessment: Intact Treatment options for stomal/peristomal skin: 2 inch barrier ring Output  Yellow urine Brown stool Ostomy pouching:  1pc convex urostomy pouch Soila [phone]) with 2 barrier rings Soila # [phone]), 1pc soft convex ileostomy Soila # [phone])  Education provided: None Enrolled patient in [place] Secure [employer] Discharge program: Yes- previous admission   WOC team will follow patient weekly for continued support with ostomy teaching.  Bedside RN to manage changing ostomy appliances for the second change of the week.  Care plan communicated to bedside RN.   Thank you,  Doyal Polite, RN, MSN, [facility] WOC Team

## 2024-06-03 DIAGNOSIS — R5381 Other malaise: Secondary | ICD-10-CM | POA: Diagnosis not present

## 2024-06-03 LAB — GLUCOSE, CAPILLARY
Glucose-Capillary: 114 mg/dL — ABNORMAL HIGH (ref 70–99)
Glucose-Capillary: 113 mg/dL — ABNORMAL HIGH (ref 70–99)
Glucose-Capillary: 95 mg/dL (ref 70–99)
Glucose-Capillary: 131 mg/dL — ABNORMAL HIGH (ref 70–99)

## 2024-06-03 LAB — CREATININE, FLUID (PLEURAL, PERITONEAL, JP DRAINAGE): Creat, Fluid: 1.1 mg/dL

## 2024-06-03 MED ORDER — MEDIHONEY WOUND/BURN DRESSING EX PSTE
1.0000 | PASTE | Freq: Every day | CUTANEOUS | Status: DC
  Administered 2024-06-03 – 2024-06-19 (×16): 1 via TOPICAL
  Filled 2024-06-03 (×2): qty 44

## 2024-06-03 MED ORDER — METOCLOPRAMIDE HCL 5 MG PO TABS
5.0000 mg | ORAL_TABLET | Freq: Three times a day (TID) | ORAL | Status: DC
  Administered 2024-06-03 – 2024-06-05 (×6): 5 mg via ORAL
  Filled 2024-06-03 (×6): qty 1

## 2024-06-03 NOTE — Progress Notes (Signed)
 PROGRESS NOTE   Subjective/Complaints:  Pt reports still nauseated- taking Compazine , but appears not asking for reglan -  so will schedule for now .  He admits nausea is better with scheduled compazine , but still a problem.   Ostomy leaking a lot- 3x needed to be changed in last 24 hours- hasn't been burping ostomy. Per pt, wife.  Did eat too much this AM- 1 bite more than should have, and caused more nausea.   Pain not too bad- no meds overnight for pain     ROS:  Per HPI   Pt denies SOB, abd pain, CP, N/V/C/D, and vision changes   Objective:   No results found. Recent Labs    06/01/24 0522  WBC 6.7  HGB 10.7*  HCT 32.1*  PLT 201   Recent Labs    06/01/24 0522  NA 136  K 3.7  CL 114*  CO2 14*  GLUCOSE 92  BUN 20  CREATININE 1.08  CALCIUM  8.6*    Intake/Output Summary (Last 24 hours) at 06/03/2024 0903 Last data filed at 06/03/2024 0848 Gross per 24 hour  Intake 600 ml  Output 1455 ml  Net -855 ml        Physical Exam: Vital Signs Blood pressure 101/60, pulse 93, temperature 98.9 F (37.2 C), temperature source Oral, resp. rate 16, height 5' 11 (1.803 m), weight 92.5 kg, SpO2 98%.     General: awake, alert, appropriate, mildly HOH- wife at bedside;  c/o nausea; NAD HENT: conjugate gaze; oropharynx dry CV: regular rate and rhythm; no JVD Pulmonary: CTA B/L; no W/R/R- good air movement GI: soft, mildly TTP- no rebound, ND, (+)BS Psychiatric: appropriate- appears slightly passive Neurological: Ox3 Skin: wound VAC off- wet to dry dressings in place- also drains appear just emptied as well as ostomies. Spot on R side near 1 drain that looks like an old drain site- not clear if it is- oval around tip of pinky sized- mild slough and open wound- mildly erythematous around edges  Assessment/Plan: 1. Functional deficits which require 3+ hours per day of interdisciplinary therapy in a comprehensive  inpatient rehab setting. Physiatrist is providing close team supervision and 24 hour management of active medical problems listed below. Physiatrist and rehab team continue to assess barriers to discharge/monitor patient progress toward functional and medical goals  Care Tool:  Bathing    Body parts bathed by patient: Right arm, Left arm, Chest, Abdomen, Front perineal area, Right upper leg, Left upper leg, Face   Body parts bathed by helper: Buttocks, Right lower leg, Left lower leg     Bathing assist Assist Level: Minimal Assistance - Patient > 75%     Upper Body Dressing/Undressing Upper body dressing   What is the patient wearing?: Pull over shirt    Upper body assist Assist Level: Set up assist    Lower Body Dressing/Undressing Lower body dressing      What is the patient wearing?: Underwear/pull up     Lower body assist Assist for lower body dressing: Maximal Assistance - Patient 25 - 49%     Toileting Toileting Toileting Activity did not occur (Clothing management and hygiene only): N/A (no void or  bm) (Urostomy/Colostomy)  Toileting assist       Transfers Chair/bed transfer  Transfers assist     Chair/bed transfer assist level: Set up assist     Locomotion Ambulation   Ambulation assist      Assist level: Contact Guard/Touching assist Assistive device: Walker-rolling Max distance: 110'   Walk 10 feet activity   Assist  Walk 10 feet activity did not occur: Safety/medical concerns (pt too weak, fatigue)  Assist level: Contact Guard/Touching assist Assistive device: Walker-rolling   Walk 50 feet activity   Assist Walk 50 feet with 2 turns activity did not occur: Safety/medical concerns (pt too weak, fatigue)  Assist level: Contact Guard/Touching assist Assistive device: Walker-rolling    Walk 150 feet activity   Assist Walk 150 feet activity did not occur: Safety/medical concerns (pt too weak, fatigue)         Walk 10 feet on  uneven surface  activity   Assist Walk 10 feet on uneven surfaces activity did not occur: Safety/medical concerns (pt too weak, fatigue)         Wheelchair     Assist Is the patient using a wheelchair?: Yes Type of Wheelchair: Manual    Wheelchair assist level: Supervision/Verbal cueing Max wheelchair distance: 45    Wheelchair 50 feet with 2 turns activity    Assist    Wheelchair 50 feet with 2 turns activity did not occur:  (pt too weak, fatigue)   Assist Level: Dependent - Patient 0%   Wheelchair 150 feet activity     Assist  Wheelchair 150 feet activity did not occur:  (pt too weak, fatigue)   Assist Level: Dependent - Patient 0%   Blood pressure 101/60, pulse 93, temperature 98.9 F (37.2 C), temperature source Oral, resp. rate 16, height 5' 11 (1.803 m), weight 92.5 kg, SpO2 98%.  Medical Problem List and Plan: 1. Functional deficits secondary to debility 2/2 perforated bowel             -patient may not shower due to wound vac             -ELOS/Goals: 10-14 days S             Admit to CIR             D/c 06/19/24  Con't CIR PT and OT  HOH makes it harder for pt to understand plan/discuss issues.  2.  Antithrombotics: -DVT/anticoagulation:  Mechanical:  Antiembolism stockings, knee (TED hose) Bilateral lower extremities             -antiplatelet therapy: continue Eliquis  3. Pain Management: continue Scheduled Tylenol ; Oxycodone  as needed took 30mg  Oxy IR yesterday , 20mg  thus far today    7/17- took tylenol  this AM- pain down to 2-3/10- will con't regimen and monitor if needs more with therapy  7/22- Will change tylenol  to 500 mg BID and con't oxy prn  7/23- hasn't required pain meds overnight- doing better 4. Mood/Behavior/Sleep: LCSW to follow for evaluation and support when available.              -antipsychotic agents: N/A             -anxiety: continue Atarax  25 mg  5. Neuropsych/cognition: This patient is capable of making decisions on his  own behalf.   6. Complicated wound: WOC nurse consultation             - Continue wound VAC  Zosyn  d/ced as per surgery recommendation   7/17- con't VAC- WOC to change- Tuesday and Friday per WOC note 7. Fluids/Electrolytes/Nutrition: Monitor I's & O's--Recheck monitor labs routinely CBC/CMP              - Bowel regimen for ileostomy: Continue FiberCon and loperamide  twice daily Goal is [id number] mL out fecal ileostomy a day              - Carb/cardiac diet -continue Juven/Prosource/Ensure supplements              -Nausea/vomiting:  seems to have improved- PRN orders Reglan  and Zofran    8. AKI/CKD stage IIIb: Creatinine improving 2.3 now 1.91 -monitor BMP   7/17- Cr 2.05- on IVF's 125/cc/hour- will continue for now and recheck labs in AM             - On continuous IVF NS 125 mL/hr   7/18- Will reduce IVFs to 75cc/hour-     Latest Ref Rng & Units 06/01/2024    5:22 AM 05/31/2024    5:51 AM 05/29/2024    5:53 AM  BMP  Glucose 70 - 99 mg/dL 92  84  89   BUN 8 - 23 mg/dL 20  26  50   Creatinine 0.61 - 1.24 mg/dL 8.91  8.89  8.62   Sodium 135 - 145 mmol/L 136  135  134   Potassium 3.5 - 5.1 mmol/L 3.7  3.6  4.1   Chloride 98 - 111 mmol/L 114  111  108   CO2 22 - 32 mmol/L 14  16  17    Calcium  8.9 - 10.3 mg/dL 8.6  8.8  9.1     2/79- reduce IVF to 50cc/hr Recent ECHO showed normal Ej fx, no concerns or signs of fluid overload at this time   7/22- will stop IVF's- IV is leaking and pt wants to try off it.   7/23- labs in AM 9. DM type II: 04/01/24 Hemoglobin A1c 5.3  -Monitor BS AC/at bedtime and use SSI 7/17- will change to regular diet and try to have pt make good choices- since poor intake- CBG's low 7/18- BG's 84-125- well controlled- con't regimen- probably low due to poor intake.  7/23- great control- con't regimen 10.  Severe Hypotension: Order orthostatic vitals              - teds ordered             -increase midodrine  to 10mg  TID   7/17- BP running 80s systolic and  feels lightheaded- Midodrine  was increased yesterday to 10 mg TID- if doesn't improve today, will order ACE wraps as well as Thigh high TED's- cannot do abd binder due to abd drains, VAC and ostomies- if this doesn't improve in next 24 hours, will add Florinef .  Also liberalized his diet to regular diet so can have some salt to help.   7/18- will add Florinef  0.1 mg daily- advised therapy to add ACE wraps- wasn't started yesterday- pt IS symptomatic- will also continue IVFs for now Vitals:   06/02/24 2000 06/03/24 0413  BP: 100/65 101/60  Pulse: 99 93  Resp: 18 16  Temp: 98.9 F (37.2 C) 98.9 F (37.2 C)  SpO2: 96% 98%  Overall improved since 7/16 but remains soft cont IVF but at lower rate   7/22- Stopped IVFs since IV is leaking- pt wants ot get rid of IVF's- pt needs to drink 6-8 cups of milk juice or water /day- if  labs get worse on Thursday, will restart IVFs. BP's running low 100's systolic- better than before.   7/23- per pt, less OH 11. Stage I Obesity: BMI 31.49 kg/m  -educate on diet and weight loss to promote overall health and mobility.   7/17- BMI down to 28.44 7/22- BMI 28.44 12. Hyperlipidemia: Continue Crestor    13. Screening for vitamin D  deficiency:check vitamin D  level tomorrow 14. Hyponatremia  7/17- Na down to 128- but is on IVF's- hard to fluid restrict due to Renal issues, so will keep on IVF's and recheck in AM  7/18- Na up to 134- will not intervene more  7/22- Na up to 136 15. Poor appetite, nausea- frequent  7/18- will schedule low dose Compazine  before meals TID due to frequent nausea impacting his ability to eat meals. 7/22- still has severe loss of appetite, even though nausea is doing somewhat better- will Add Periacin 4 mg at bedtime- cannot add Remeron due to prolonged QT interval  7/23- will schedule Reglan  5 mg TID-AC to also help with nausea .   I spent a total of  43  minutes on total care today- >50% coordination of care- due to reached out to  nursing x2 about ostomy leaking- thinking due ot lack of burping? Also ordered labs for tomorrow and added Reglan -      I spent a total of 52    minutes on total care today- >50% coordination of care- due to  review of labs; vitals, output-  Nephrostomy drain put out 375cc overnight- not appropriate to get taken out.  Also d/w pt and wife about options to help with intake and we compromised that will take out IV and stop IVFs but he needs ot drink 6-8 cups of water /milk/juice/day- Coke doesn't count.    LOS: 7 days A FACE TO FACE EVALUATION WAS PERFORMED  Kristopher Thompson 06/03/2024, 9:03 AM

## 2024-06-03 NOTE — Progress Notes (Signed)
 Physical Therapy Weekly Progress Note  Patient Details  Name: Kristopher Thompson MRN: [id number] Date of Birth: 08-27-1951  Beginning of progress report period: June 03, 2024 End of progress report period: June 03, 2024  Today's Date: 06/03/2024 PT Individual Time: 1015-1100;  45 minutes   Patient has met  6 of 12 short term goals. Pt making steady gains with transfers, gait and activity tolerance, however, still requiring frequent rest breaks due to fatigue and encouragement while completing activities.  Focusing on increasing patient initiative and overall independence with functional activities.   Patient continues to demonstrate the following deficits muscle weakness, decreased cardiorespiratoy endurance, and decreased standing balance, decreased postural control, and decreased balance strategies and therefore will continue to benefit from skilled PT intervention to increase functional independence with mobility, reduce care taker burden, increase endurance, falls prevention  PT Short Term Goals Week 1:  PT Short Term Goal 1 (Week 1): = LTG Week 2:     Skilled Therapeutic Interventions/Progress Updates:    Pt in bed when PT arrived, pt agreeable for session.  Wife presents and reports pt's bags were leaking all night, pt continues to have nausea.  Pt denies pain during session, but c/o dizziness. Orthostatic BP taken in supine: 98/56, sitting EOB 106/59, standing with RW 113/55. Not c/o increased dizziness.  Pt sat at EOB due to fatigue with UE supported on RW. Socks donned with total dependence, pt able to donn shoes with set up.  Pt transferred to w/c with supervision using RW.  Pt appears to be melancholy today.  PT wheeled pt to [address] corridor to get out of his room.  Pt enjoyed the view.  PT spoke with wife regarding goals, expectations. Pt wheeled back to room up in w/c until next session with OT.  Wife presents, all items within reach.   Patient progressing toward long term goals..  Continue plan of care.  Therapy Documentation Precautions:  Precautions Precautions: Fall Recall of Precautions/Restrictions: Intact Precaution/Restrictions Comments: Urostomy/Colostomy, Ab wound vac, B nephrostomy drains Restrictions Weight Bearing Restrictions Per Provider Order: No    Pain: No pain today     Therapy/Group: Individual Therapy  Kristopher Thompson Fast 06/03/2024, 8:25 AM

## 2024-06-03 NOTE — Patient Care Conference (Signed)
 Inpatient RehabilitationTeam Conference and Plan of Care Update Date: 06/02/2024   Time: 1159 am    Patient Name: Kristopher Thompson      Medical Record Number: [id number]  Date of Birth: 1951-05-06 Sex: Male         Room/Bed: 4W23C/4W23C-01 Payor Info: Payor: MEDICARE / Plan: MEDICARE PART A AND B / Product Type: *No Product type* /    Admit Date/Time:  05/27/2024  3:04 PM  Primary Diagnosis:  Debility  Hospital Problems: Principal Problem:   Debility Active Problems:   S/P ileal conduit (HCC)   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 08/08/2017   GERD (gastroesophageal reflux disease)   Obesity (BMI 35.0-39.9 without comorbidity)   Irritant contact dermatitis associated with fecal stoma   Chronic anticoagulation   History of bladder cancer   Obstructive sleep apnea of adult   Personal history of PE (pulmonary embolism)   Delayed bowel perforation s/p SBR/end ileostomy   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 Megan Lovorn Social Worker Present:  Versa Ronde RN) Nurse Present: Eulalio Falls, RN PT Present: Other (comment) Lessie Miso) OT Present: Camie Hoe, OT     Current Status/Progress Goal Weekly Team Focus  Bowel/Bladder   pt has ileostomy, urostomy and bilat nephrostomy tubes.   remain free of infection   empty tubes/bags PRN    Swallow/Nutrition/ Hydration               ADL's   Min UB ADLs, Mod LB ADLs, Min A sponge bathing, OT assisting with line management during mobility, CGA transfers, decreased activity tolerance   SBA overall   family training, ADL retraining with AE for increased independence, endurance training     Mobility   Mod/min assist for transfers, CGA X2 with gait due to equipment, fatigues easily   supervision for gait with RW, mod indep for transfers  safety precautions/fall prevention, education on energy conservation, strengthening, standing/activity tolerence/endurance    Communication                Safety/Cognition/ Behavioral Observations               Pain   Pt complains of mild abd pain and back pain   keep pain managed < 3   Q shift pain assessments and PRN    Skin   surgical incisions throughout abdomen w/ wound vac. Stage 2 on sacrum   pt incisions will remain free of infection  Assess skin q shift and prn      Discharge Planning:  D/C w supportive wife who can provide 24/7 care; anticipate  [employer] Start up kit/referral for ostomy/pre-existing ileostomy. Nephrostomy tube care needs but surgical tubes pending and wound vac for home.    Team Discussion: Patient was admitted post debility due to perforated bowels. Patient with wound vac, bilateral nephrostomy tubes,  ileostomy  and nephrostomy tubes. Patient with pain/ soft bp/ hyponatremia/ hypotension: medication  adjusted by MD. Patient limited by hearing problems, loss of appetite, fatigue, poor activity tolerance,poor endurance and self limiting behaviors.   Patient on target to meet rehab goals: Currently, patient requires minimal assistance with upper body care and moderate assistance  with lower body care. Patient needs mod/min assistance with for transfers. Patient needs CGA x2 for ambulation due to equipment and fatigues easily. Overall goals for discharge are set for Supervision assistance.   *See Care Plan and progress notes for long and short-term goals.   Revisions to Treatment Plan:  TEDS WOC consult Increase fluid intake Enrolled with [employer] secure start discharge program  Teaching Needs: Safety, medications, incision care, diet and weight loss education, tube management, transfers,  toileting , etc   Current Barriers to Discharge: Decreased caregiver support, Home enviroment access/layout, Wound care, and Weight  Possible Resolutions to Barriers: Family Education     Medical Summary Current Status: Not drinking enough- has urostomy and ileostomy; and multiple drains and midline incision with VAC- think might change to wet to dry- but has come off later in week- stage II on sacrum; depression; very poor appetite; still significant hypotension; pain is significxant.  Barriers to Discharge: Behavior/Mood;Complicated Wound;Hypotension;Uncontrolled Pain;Renal Insufficiency/Failure;Medical stability;Self-care education;Other (comments);Weight bearing restrictions  Barriers to Discharge Comments: severe wound issues; current VAC; poor intake; inadequate intake; poor appetite; poor endurance/activity tolerance; self limiting- lots of encouragmeent to do things- Very HOH; Possible Resolutions to [employer] Focus: started Periactin  for appetite; cannot use Remeron due to porlonged QT interval;  goals Supervision;  walked 175ft with 1 break; with RW; CGA;   d/c 8/8   Continued Need for Acute Rehabilitation Level of Care: The patient requires daily medical management by a physician with specialized training in physical medicine and rehabilitation for the following reasons: Direction of a multidisciplinary physical rehabilitation program to maximize functional independence : Yes Medical management of patient stability for increased activity during participation in an intensive rehabilitation regime.: Yes Analysis of laboratory values and/or radiology reports with any subsequent need for medication adjustment and/or medical intervention. : Yes   I attest that I was present, lead the team conference, and concur with the assessment and plan of the team.   Kristiane Morsch Gayo 06/02/2024, 1159 am

## 2024-06-03 NOTE — Consult Note (Signed)
 WOC Nurse ostomy follow up WTA received request from bedside nurse for assistance with leaking ostomy pouch Stoma type/location:RLQ ileal conduit, RUQ ileostomy   Stomal assessment/size:  RLQ ileal conduit: oval 1 in x 1 1/4 inch, flush with skin, in valley RUQ ileostomy: 1 1/4 inch oval, abd creases at 3 o'clock and 9 o'clock Peristomal assessment: Intact  Treatment options for stomal/peristomal skin: 2 inch barrier ring  Output  Yellow urine Brown stool Ostomy pouching:  1pc convex urostomy pouch Soila [phone]) with 2 barrier rings Soila # [phone]), 1pc soft convex ileostomy Soila # [phone])  Education provided: discussed with patient and spouse potential benefits from barrier extenders and ostomy belt use.  Patient reports current belt is too small, provided bedside RN with alternative lawson numbers to explore possibility of larger size.  Spouse reports she plans to place an order for barrier extenders and bring to hospital once they arrive.  Utilized Medipore tape during this visit to substitute for a barrier extender which is not on formulary. Enrolled patient in [place] Secure Start Discharge program: Yes  WOC team will follow patient weekly for continued support with ostomy teaching. Bedside RN to manage changing ostomy appliances for the second change of the week.   Thank you,  Doyal Polite, RN, MSN, [facility] WOC Team

## 2024-06-03 NOTE — Progress Notes (Signed)
 Occupational Therapy Weekly Progress Note  Patient Details  Name: Kristopher Thompson MRN: [id number] Date of Birth: 1951-06-29  Beginning of progress report period: May 28, 2024 End of progress report period: June 03, 2024  Today's Date: 06/03/2024 OT Individual Time: 9199-9159 & 1300-1415 OT Individual Time Calculation (min): 40 min & 75 min   Patient has met 3 of 3 short term goals. Pt with steady increase in independence with ADLs, transfers and activity tolerance. Pt still requiring frequent rest breaks d/t fatigue and encouragement while completing activities.   Patient continues to demonstrate the following deficits: muscle weakness, decreased cardiorespiratoy endurance, and decreased sitting balance, decreased standing balance, decreased postural control, and decreased balance strategies and therefore will continue to benefit from skilled OT intervention to enhance overall performance with BADL and Reduce care partner burden.  Patient progressing toward long term goals.  Continue plan of care.  OT Short Term Goals Week 1:  OT Short Term Goal 1 (Week 1): Pt will perform stand-step transfer with Min A + RW. OT Short Term Goal 1 - Progress (Week 1): Met OT Short Term Goal 2 (Week 1): Pt will thread LB garment with Min A + LRAD. OT Short Term Goal 2 - Progress (Week 1): Met OT Short Term Goal 3 (Week 1): Pt will tolerate >2 mins of standing activity for carryover in ADLs. OT Short Term Goal 3 - Progress (Week 1): Met Week 2:  OT Short Term Goal 1 (Week 2): Pt will complete transfers at [facility] consistently with LRAD OT Short Term Goal 2 (Week 2): Pt will complete ADL activity in standing for >3 min with LRAD OT Short Term Goal 3 (Week 2): Pt will complete LB dressing at [facility] with AE as required  Skilled Therapeutic Interventions/Progress Updates:      Therapy Documentation Precautions:  Precautions Precautions: Fall Recall of Precautions/Restrictions:  Intact Precaution/Restrictions Comments: Urostomy/Colostomy, Ab wound vac, B nephrostomy drains Restrictions Weight Bearing Restrictions Per Provider Order: No Session 1 General:  Pt supine in bed upon OT arrival, agreeable to OT session.  Pain: no pain reported  ADL: OT providing skilled intervention on ADL retraining in order to increase independence with tasks and increase activity tolerance. Pt completed the following tasks at the current level of assist: Bed mobility: SBA with increased time using bed rails with HOB raised, Mod A to get back into bed  Grooming/oral hygiene: SBA seated at sink for oral hygiene Transfers: SBA EOB><W/C with RW, OT noting leakage on seal of ostomy and urostomy bag, OT assisting pt back to bed for nsg to change bags  Exercises: Pt completed the following exercise circuit in order to improve functional activity, strength and endurance to prepare for ADLs such as bathing. Pt completed the following exercises in supine position with no noted LOB/SOB and 3x10 repetitions on each exercise: -triceps extensions   Pt supine in bed with nsg d/t seal leak on multiple bags.   Session 2 General: Pt supine in bed upon OT arrival, agreeable to OT session.  Pain: no pain reported  Exercises: Pt completed the following exercise circuit in order to improve functional activity, strength and endurance to prepare for ADLs such as bathing. Pt completed the following exercises in seated/standing position with no noted LOB/SOB and various repetitions on each exercise: -forward punches with yellow theraband  -rows 3x10 with green theraband -diagonal shoulder flexion with yellow theraband and 3x10 -sit to stands 2x5 at RW and SBA   Other Treatments:  Pt completed  8 minutes of arm bike, 4 min forward, 4 min backward in order to increase BUE/BLEstrength and endurance in preparation for increased independence in ADLs while applying energy conservation techniques. Occasional  shirt rest breaks, on level 3 resistance.    OT assisting pt back to bed at Mod A Pt supine in bed with bed alarm activated, 2 bed rails up, call light within reach and 4Ps assessed.   Therapy/Group: Individual Therapy  Camie Hoe, OTD, OTR/L 06/03/2024, 2:02 PM

## 2024-06-03 NOTE — Progress Notes (Signed)
 Occupational Therapy Session Note  Patient Details  Name: Kristopher Thompson MRN: [id number] Date of Birth: Jun 09, 1951  Today's Date: 06/03/2024 OT Individual Time: 1100-1145 OT Individual Time Calculation (min): 45 min    Short Term Goals: Week 1:  OT Short Term Goal 1 (Week 1): Pt will perform stand-step transfer with Min A + RW. OT Short Term Goal 1 - Progress (Week 1): Met OT Short Term Goal 2 (Week 1): Pt will thread LB garment with Min A + LRAD. OT Short Term Goal 2 - Progress (Week 1): Met OT Short Term Goal 3 (Week 1): Pt will tolerate >2 mins of standing activity for carryover in ADLs. OT Short Term Goal 3 - Progress (Week 1): Met Week 2:  OT Short Term Goal 1 (Week 2): Pt will complete transfers at [facility] consistently with LRAD OT Short Term Goal 2 (Week 2): Pt will complete ADL activity in standing for >3 min with LRAD OT Short Term Goal 3 (Week 2): Pt will complete LB dressing at [facility] with AE as required  Skilled Therapeutic Interventions/Progress Updates:    1:1 Pt received in the w/c and reported that he didn't feel well this morning and reported more fatigue today but was willing to try and do what he can. Pt taken down to the dayroom. Therapeutic activity to work on activity tolerance/ endurance and overall strengthening for improved ADL performance. Pt performed toe taps onto 6 inch step with bilateral UE support for ~ 1 min. Pt requires prolonged rest breaks after each activity. Then performed straight UE raises to 90- 15 reps with 5 lb weighted bar. Then returned to toe taps in standing and then in sitting tricep pulls with 5 lb bar behind head 10 reps. Then performed one walk ~ 56 feet. Afterwards pt reported he is done and too tired to do more. Pt transferred into the bed with contact guard and min A to guide LEs back into the bed.   Pt also continues to request another person to follow behind him with the w/c while ambulating.   Therapy Documentation Precautions:   Precautions Precautions: Fall Recall of Precautions/Restrictions: Intact Precaution/Restrictions Comments: Urostomy/Colostomy, Ab wound vac, B nephrostomy drains Restrictions Weight Bearing Restrictions Per Provider Order: No  Pain: Pain Assessment Pain Scale: 0-10 Pain Score: 0-No pain Just c/o continuous nausea    Therapy/Group: Individual Therapy  Claudene Nest [facility] 06/03/2024, 12:05 PM

## 2024-06-04 DIAGNOSIS — R5381 Other malaise: Secondary | ICD-10-CM | POA: Diagnosis not present

## 2024-06-04 LAB — BASIC METABOLIC PANEL WITH GFR
Anion gap: 10 (ref 5–15)
BUN: 13 mg/dL (ref 8–23)
CO2: 15 mmol/L — ABNORMAL LOW (ref 22–32)
Calcium: 9.2 mg/dL (ref 8.9–10.3)
Chloride: 108 mmol/L (ref 98–111)
Creatinine, Ser: 1.28 mg/dL — ABNORMAL HIGH (ref 0.61–1.24)
GFR, Estimated: 60 mL/min — ABNORMAL LOW (ref >60)
Glucose, Bld: 89 mg/dL (ref 70–99)
Potassium: 3.7 mmol/L (ref 3.5–5.1)
Sodium: 133 mmol/L — ABNORMAL LOW (ref 135–145)

## 2024-06-04 LAB — GLUCOSE, CAPILLARY
Glucose-Capillary: 108 mg/dL — ABNORMAL HIGH (ref 70–99)
Glucose-Capillary: 122 mg/dL — ABNORMAL HIGH (ref 70–99)
Glucose-Capillary: 107 mg/dL — ABNORMAL HIGH (ref 70–99)
Glucose-Capillary: 100 mg/dL — ABNORMAL HIGH (ref 70–99)

## 2024-06-04 LAB — CBC WITH DIFFERENTIAL/PLATELET
Abs Immature Granulocytes: 0.07 K/uL (ref 0.00–0.07)
Basophils Absolute: 0.1 K/uL (ref 0.0–0.1)
Basophils Relative: 1 %
Eosinophils Absolute: 0.9 K/uL — ABNORMAL HIGH (ref 0.0–0.5)
Eosinophils Relative: 10 %
HCT: 31.5 % — ABNORMAL LOW (ref 39.0–52.0)
Hemoglobin: 10.3 g/dL — ABNORMAL LOW (ref 13.0–17.0)
Immature Granulocytes: 1 %
Lymphocytes Relative: 18 %
Lymphs Abs: 1.6 K/uL (ref 0.7–4.0)
MCH: 29.3 pg (ref 26.0–34.0)
MCHC: 32.7 g/dL (ref 30.0–36.0)
MCV: 89.7 fL (ref 80.0–100.0)
Monocytes Absolute: 1.1 K/uL — ABNORMAL HIGH (ref 0.1–1.0)
Monocytes Relative: 13 %
Neutro Abs: 5 K/uL (ref 1.7–7.7)
Neutrophils Relative %: 57 %
Platelets: 205 K/uL (ref 150–400)
RBC: 3.51 MIL/uL — ABNORMAL LOW (ref 4.22–5.81)
RDW: 15.1 % (ref 11.5–15.5)
WBC: 8.7 K/uL (ref 4.0–10.5)
nRBC: 0 % (ref 0.0–0.2)

## 2024-06-04 LAB — PREALBUMIN: Prealbumin: 20 mg/dL (ref 18–38)

## 2024-06-04 LAB — ALBUMIN: Albumin: 2.3 g/dL — ABNORMAL LOW (ref 3.5–5.0)

## 2024-06-04 NOTE — Progress Notes (Signed)
 Physical Therapy Session Note  Patient Details  Name: Kristopher Thompson MRN: [id number] Date of Birth: 1951-04-06  Today's Date: 06/04/2024 PT Individual Time: 0800-0915 PT Individual Time Calculation (min): 75 min   Short Term Goals: Week 1: PT Short Term Goal 1 (Week 1): = LTG  Skilled Therapeutic Interventions/Progress Updates:     Pt received in room seated EOB w/ wife present. Pt was agreeable to therapy. Ted hose, ace wraps donned with total assist. Min A stand pivot transfer with RW to w/c. Transfer performed with increased time/effort, pt stated that's probably the hardest stand I've had to do since being here. Pt wheeled to gym and where they performed warmup on NuStep lvl 1 to improve activity tolerance. Pt then performed lateral steps at therapy table moving 5 bowling pins from one side to the other, x4 reps w/ 2-6min rest each time. This was performed to improve standing tolerance and to improve cardiovascular endurance. Pt reports exercise as being difficult, RPE estimated ~7/10. Noted that patient STS improved to CGA w/ decreased time/effort during session. Pt returned to semi-fowlers in bed, call bell and all needs within reach w/ wife present. Bed alarm turned on.    Therapy Documentation Precautions:  Precautions Precautions: Fall Recall of Precautions/Restrictions: Intact Precaution/Restrictions Comments: Urostomy/Colostomy, Ab wound vac, B nephrostomy drains Restrictions Weight Bearing Restrictions Per Provider Order: No  Pain: Pain Assessment Pain Scale: 0-10 Pain Score: 1  Pain Location: Abdomen Patients Stated Pain Goal: 1 Pain Intervention(s): Ambulation/increased activity   Therapy/Group: Individual Therapy  [name] 06/04/2024, 9:27 AM

## 2024-06-04 NOTE — Progress Notes (Signed)
 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  Defer to urology (Dr Renda) on next steps (capping nephrostomy tubes, removing stents, etc).  Continue RLQ abdominal wall surgical Blake drain for now until some efforts are made to remove urinary diversion to make sure there is no recurrent issue or concern.  Once abdominal drain less than 30 mL a day x 2 days, can consider removal if urology concurs.

## 2024-06-04 NOTE — Plan of Care (Signed)
  Problem: RH SKIN INTEGRITY Goal: RH STG SKIN FREE OF INFECTION/BREAKDOWN Description: Manage skin free of infection/breakdown with minimal assistance Outcome: Progressing   Problem: RH SAFETY Goal: RH STG ADHERE TO SAFETY PRECAUTIONS W/ASSISTANCE/DEVICE Description: STG Adhere to Safety Precautions With minimal assistance Assistance/Device. Outcome: Progressing   Problem: RH PAIN MANAGEMENT Goal: RH STG PAIN MANAGED AT OR BELOW PT'S PAIN GOAL Description: <4 w/ prns Outcome: Progressing

## 2024-06-04 NOTE — Progress Notes (Signed)
 Patient ID: JEWELZ KOBUS, male   DOB: Mar 14, 1951, 73 y.o.   MRN: [id number] Met with the patient and wife to review team conference updates. Discussed discharge planned for 06/19/24 at a supervision level overall. Need to work on confidence and poor endurance. Wound vac off, wet - dry dressing to abd. incision with drainage.  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 Wife reports has supplies and aware of enrollment in [facility] starter kit program. Currently at a min - mod assist level for ADLs and supervision for transfers but able to ambulate up to 10' using a RW with CGA. Follow up services and DME pending. Continue to follow along to address barriers to facilitate preparation for discharge. Fredericka [name] NOVAK

## 2024-06-04 NOTE — Progress Notes (Signed)
 PROGRESS NOTE   Subjective/Complaints:  Pt reports leaking from ostomy improved- WOC was able to help- wife to get some products to make it even better today- outside hospital.  Nausea better since yesterday, when added Reglan  scheduled.   Actually better appetite today as well- ate 2 bowls of cheerios this AM.  Not needing to have abd wound changed as often- not draining as much.   Bgs great Cr 1.28 up from 1.08.    ROS:  Per HPI   Pt denies SOB, abd pain, CP, N/V/C/D, and vision changes    Objective:   No results found. Recent Labs    06/04/24 0501  WBC 8.7  HGB 10.3*  HCT 31.5*  PLT 205   Recent Labs    06/04/24 0501  NA 133*  K 3.7  CL 108  CO2 15*  GLUCOSE 89  BUN 13  CREATININE 1.28*  CALCIUM  9.2    Intake/Output Summary (Last 24 hours) at 06/04/2024 0843 Last data filed at 06/04/2024 0700 Gross per 24 hour  Intake 477 ml  Output 1267 ml  Net -790 ml        Physical Exam: Vital Signs Blood pressure (!) 105/57, pulse 84, temperature 98.5 F (36.9 C), resp. rate 19, height 5' 11 (1.803 m), weight 92.5 kg, SpO2 99%.      General: awake, alert, appropriate, sitting up in bed; wife at bedside; NAD HENT: conjugate gaze; oropharynx dry- nose opening thing on nares CV: regular rate and rhythm; no JVD Pulmonary: CTA B/L; no W/R/R- good air movement GI: soft, NT, ND, (+)BS- ostomies ~ 1/8 full-  Psychiatric: appropriate- still flat Neurological: Ox3  Skin: Wound on abdomen- C/D/I- dressing changed at 5am; - Spot on R side near 1 drain that looks like an old drain site- not clear if it is- oval around tip of pinky sized- mild slough and open wound- mildly erythematous around edges  Assessment/Plan: 1. Functional deficits which require 3+ hours per day of interdisciplinary therapy in a comprehensive inpatient rehab setting. Physiatrist is providing close team supervision and 24 hour  management of active medical problems listed below. Physiatrist and rehab team continue to assess barriers to discharge/monitor patient progress toward functional and medical goals  Care Tool:  Bathing    Body parts bathed by patient: Right arm, Left arm, Chest, Abdomen, Front perineal area, Right upper leg, Left upper leg, Face   Body parts bathed by helper: Buttocks, Right lower leg, Left lower leg     Bathing assist Assist Level: Minimal Assistance - Patient > 75%     Upper Body Dressing/Undressing Upper body dressing   What is the patient wearing?: Pull over shirt    Upper body assist Assist Level: Set up assist    Lower Body Dressing/Undressing Lower body dressing      What is the patient wearing?: Underwear/pull up     Lower body assist Assist for lower body dressing: Maximal Assistance - Patient 25 - 49%     Toileting Toileting Toileting Activity did not occur (Clothing management and hygiene only): N/A (no void or bm) (Urostomy/Colostomy)  Toileting assist       Transfers Chair/bed transfer  Transfers assist     Chair/bed transfer assist level: Supervision/Verbal cueing     Locomotion Ambulation   Ambulation assist      Assist level: Contact Guard/Touching assist Assistive device: Walker-rolling Max distance: 110'   Walk 10 feet activity   Assist  Walk 10 feet activity did not occur: Safety/medical concerns (pt too weak, fatigue)  Assist level: Contact Guard/Touching assist Assistive device: Walker-rolling   Walk 50 feet activity   Assist Walk 50 feet with 2 turns activity did not occur: Safety/medical concerns (pt too weak, fatigue)  Assist level: Contact Guard/Touching assist Assistive device: Walker-rolling    Walk 150 feet activity   Assist Walk 150 feet activity did not occur: Safety/medical concerns (pt too weak, fatigue)         Walk 10 feet on uneven surface  activity   Assist Walk 10 feet on uneven surfaces  activity did not occur: Safety/medical concerns (pt too weak, fatigue)         Wheelchair     Assist Is the patient using a wheelchair?: Yes Type of Wheelchair: Manual    Wheelchair assist level: Total Assistance - Patient < 25% Max wheelchair distance: >400'    Wheelchair 50 feet with 2 turns activity    Assist    Wheelchair 50 feet with 2 turns activity did not occur:  (pt too weak, fatigue)   Assist Level: Total Assistance - Patient < 25%   Wheelchair 150 feet activity     Assist  Wheelchair 150 feet activity did not occur:  (pt too weak, fatigue)   Assist Level: Total Assistance - Patient < 25%   Blood pressure (!) 105/57, pulse 84, temperature 98.5 F (36.9 C), resp. rate 19, height 5' 11 (1.803 m), weight 92.5 kg, SpO2 99%.  Medical Problem List and Plan: 1. Functional deficits secondary to debility 2/2 perforated bowel             -patient may not shower due to wound vac             -ELOS/Goals: 10-14 days S             Admit to CIR             D/c 06/19/24  Con't CIR PT an dOT Wife to get products ot help leakage from ostomies  HOH makes it harder for pt to understand plan/discuss issues.  2.  Antithrombotics: -DVT/anticoagulation:  Mechanical:  Antiembolism stockings, knee (TED hose) Bilateral lower extremities             -antiplatelet therapy: continue Eliquis  3. Pain Management: continue Scheduled Tylenol ; Oxycodone  as needed took 30mg  Oxy IR yesterday , 20mg  thus far today    7/17- took tylenol  this AM- pain down to 2-3/10- will con't regimen and monitor if needs more with therapy  7/22- Will change tylenol  to 500 mg BID and con't oxy prn  7/23- hasn't required pain meds overnight- doing better  7/24- pain doing better 4. Mood/Behavior/Sleep: LCSW to follow for evaluation and support when available.              -antipsychotic agents: N/A             -anxiety: continue Atarax  25 mg  5. Neuropsych/cognition: This patient is capable of making  decisions on his own behalf.   6. Complicated wound: WOC nurse consultation             - Continue wound VAC  Zosyn  d/ced as per surgery recommendation   7/17- con't VAC- WOC to change- Tuesday and Friday per WOC note 7. Fluids/Electrolytes/Nutrition: Monitor I's & O's--Recheck monitor labs routinely CBC/CMP              - Bowel regimen for ileostomy: Continue FiberCon and loperamide  twice daily Goal is [phone] mL out fecal ileostomy a day              - Carb/cardiac diet -continue Juven/Prosource/Ensure supplements              -Nausea/vomiting:  seems to have improved- PRN orders Reglan  and Zofran  7/24- made Reglan  scheduled 5 mg TID-AC- and nausea is somewhat better this AM   8. AKI/CKD stage IIIb: Creatinine improving 2.3 now 1.91 -monitor BMP   7/17- Cr 2.05- on IVF's 125/cc/hour- will continue for now and recheck labs in AM             - On continuous IVF NS 125 mL/hr   7/18- Will reduce IVFs to 75cc/hour-     Latest Ref Rng & Units 06/04/2024    5:01 AM 06/01/2024    5:22 AM 05/31/2024    5:51 AM  BMP  Glucose 70 - 99 mg/dL 89  92  84   BUN 8 - 23 mg/dL 13  20  26    Creatinine 0.61 - 1.24 mg/dL 8.71  8.91  8.89   Sodium 135 - 145 mmol/L 133  136  135   Potassium 3.5 - 5.1 mmol/L 3.7  3.7  3.6   Chloride 98 - 111 mmol/L 108  114  111   CO2 22 - 32 mmol/L 15  14  16    Calcium  8.9 - 10.3 mg/dL 9.2  8.6  8.8     2/79- reduce IVF to 50cc/hr Recent ECHO showed normal Ej fx, no concerns or signs of fluid overload at this time   7/22- will stop IVF's- IV is leaking and pt wants to try off it.   7/23- labs in AM  7/24- Cr up to 1.28- from 1.08- will recheck Monday- is up a little because pt not drinking as much, will push fluids still 9. DM type II: 04/01/24 Hemoglobin A1c 5.3  -Monitor BS AC/at bedtime and use SSI 7/17- will change to regular diet and try to have pt make good choices- since poor intake- CBG's low 7/18- BG's 84-125- well controlled- con't regimen- probably  low due to poor intake.  7/23- 7/24- great control- con't regimen 10.  Severe Hypotension: Order orthostatic vitals              - teds ordered             -increase midodrine  to 10mg  TID   7/17- BP running 80s systolic and feels lightheaded- Midodrine  was increased yesterday to 10 mg TID- if doesn't improve today, will order ACE wraps as well as Thigh high TED's- cannot do abd binder due to abd drains, VAC and ostomies- if this doesn't improve in next 24 hours, will add Florinef .  Also liberalized his diet to regular diet so can have some salt to help.   7/18- will add Florinef  0.1 mg daily- advised therapy to add ACE wraps- wasn't started yesterday- pt IS symptomatic- will also continue IVFs for now Vitals:   06/03/24 2158 06/04/24 0523  BP: (!) 97/57 (!) 105/57  Pulse: 82 84  Resp: 18 19  Temp: 99.1 F (37.3 C) 98.5 F (36.9 C)  SpO2: 97% 99%  Overall improved since 7/16 but remains soft cont IVF but at lower rate   7/22- Stopped IVFs since IV is leaking- pt wants ot get rid of IVF's- pt needs to drink 6-8 cups of milk juice or water /day- if labs get worse on Thursday, will restart IVFs. BP's running low 100's systolic- better than before.   7/23- per pt, less [facility]  7/24- still having OH per therapy- will d/w PT/OT before changing meds 11. Stage I Obesity: BMI 31.49 kg/m  -educate on diet and weight loss to promote overall health and mobility.   7/17- BMI down to 28.44 7/22- BMI 28.44 12. Hyperlipidemia: Continue Crestor    13. Screening for vitamin D  deficiency:check vitamin D  level tomorrow 14. Hyponatremia  7/17- Na down to 128- but is on IVF's- hard to fluid restrict due to Renal issues, so will keep on IVF's and recheck in AM  7/18- Na up to 134- will not intervene more  7/22- Na up to 136  7/24- Na 133 15. Poor appetite, nausea- frequent  7/18- will schedule low dose Compazine  before meals TID due to frequent nausea impacting his ability to eat meals. 7/22- still has severe  loss of appetite, even though nausea is doing somewhat better- will Add Periacin 4 mg at bedtime- cannot add Remeron due to prolonged QT interval  7/23- will schedule Reglan  5 mg TID-AC to also help with nausea . 7/24- better appetite and nausea this AM- ate 2 bowls cheerios! Making gains    I spent a total of  36  minutes on total care today- >50% coordination of care- due to  D/w pt about drinking more- also about leaking- also d/w nursing- reviewed labs as well    LOS: 8 days A FACE TO FACE EVALUATION WAS PERFORMED  Kristopher Thompson 06/04/2024, 8:43 AM

## 2024-06-04 NOTE — Progress Notes (Signed)
 Occupational Therapy Session Note  Patient Details  Name: Kristopher Thompson MRN: [id number] Date of Birth: 1951/09/01  Today's Date: 06/04/2024 OT Individual Time: 1005-1105 OT Individual Time Calculation (min): 60 min    Short Term Goals: Week 2:  OT Short Term Goal 1 (Week 2): Pt will complete transfers at [facility] consistently with LRAD OT Short Term Goal 2 (Week 2): Pt will complete ADL activity in standing for >3 min with LRAD OT Short Term Goal 3 (Week 2): Pt will complete LB dressing at [facility] with AE as required  Skilled Therapeutic Interventions/Progress Updates:      Therapy Documentation Precautions:  Precautions Precautions: Fall Recall of Precautions/Restrictions: Intact Precaution/Restrictions Comments: Urostomy/Colostomy, Ab wound vac, B nephrostomy drains Restrictions Weight Bearing Restrictions Per Provider Order: No General:  Pt supine in bed upon OT arrival, agreeable to OT session. Pt completed bed mobility with bed rails and HOB raised with SBA and increased time. Wife reporting has moveable bed at home and they are ordering bed rails.   Pain:  2/10 pain reported in abdomen, activity, intermittent rest breaks, distractions provided for pain management, pt reports tolerable to proceed.   ADL: OT providing skilled intervention with functipnal mobility for increased independence and activity tolerance. Pt able to complete mobility with RW at [facility] and 3 rest breaks required from room>therapy gym, no LOB.   Exercises: Pt completed the following exercise circuit in order to improve functional activity, strength and endurance to prepare for ADLs such as bathing. Pt completed the following exercises in standing position with no noted LOB/SOB and various repetitions on each exercise: -toe taps on cones for 1 in intervals, trial 1 requiring multiple standing rest breaks, trial 2 only requiring 1 standing rest break, last trial requiring no rest breaks -propelling W/C from day  room>room with BLE/BUE for increased endurance, PRN rest breaks    Pt supine in bed with bed alarm activated, 2 bed rails up, call light within reach and 4Ps assessed.    Therapy/Group: Individual Therapy  Camie Hoe, OTD, OTR/L 06/04/2024, 11:08 AM

## 2024-06-04 NOTE — Progress Notes (Signed)
 Physical Therapy Session Note  Patient Details  Name: Kristopher Thompson MRN: [id number] Date of Birth: 25-Mar-1951  Today's Date: 06/04/2024 PT Individual Time: 1305-1420 PT Individual Time Calculation (min): 75 min   Short Term Goals: Week 1:  PT Short Term Goal 1 (Week 1): = LTG  Skilled Therapeutic Interventions/Progress Updates: Pt presented in bed agreeable to therapy. Pt states unrated pain, rest and repositioning provided as needed during session. Completed supine to sit with CGA and increased time with use of bed features. Donned shoes with set up. Completed stand step transfer from elevated bed with CGA to w/c. Transported to day room for energy conservation. Participated in gait training total 162ft with seated rest at 144ft. Pt noted to ambulate with decreased self selected gait speed, shortened step length, and decreased foot clearance R>L. Participated in Sit to stand from elevated mat for BLE strengthening, attempted to perform without use of UE however even with elevated height pt was unable to initiate movement. Participated in standing round of horseshoes for dynamic reaching with pt able to perform with CGA with moderate reaches. Pt required extended seated rest after first round. Participated in Cybex Kinetron 50cm/sec 2 min x 2 with 1 min break between for general conditioning. Pt propelled back to room and completed stand step transfer with RW and CGA back to bed. Required minA for BLE management for sit to supine. Ace bandages and TED hose doffed total A. Pt left in bed at end of session with call bell within reach and needs met.      Therapy Documentation Precautions:  Precautions Precautions: Fall Recall of Precautions/Restrictions: Intact Precaution/Restrictions Comments: Urostomy/Colostomy, Ab wound vac, B nephrostomy drains Restrictions Weight Bearing Restrictions Per Provider Order: No General:   Vital Signs: Therapy Vitals Temp: 98.3 F (36.8 C) Temp Source:  Oral Pulse Rate: 97 Resp: 18 BP: 94/67 (BP taken twice) Patient Position (if appropriate): Lying Oxygen Therapy SpO2: 99 % O2 Device: Room Air   Therapy/Group: Individual Therapy  Kristopher Thompson 06/04/2024, 3:48 PM

## 2024-06-05 DIAGNOSIS — R5381 Other malaise: Secondary | ICD-10-CM | POA: Diagnosis not present

## 2024-06-05 DIAGNOSIS — K631 Perforation of intestine (nontraumatic): Secondary | ICD-10-CM | POA: Diagnosis not present

## 2024-06-05 LAB — GLUCOSE, CAPILLARY
Glucose-Capillary: 92 mg/dL (ref 70–99)
Glucose-Capillary: 104 mg/dL — ABNORMAL HIGH (ref 70–99)
Glucose-Capillary: 111 mg/dL — ABNORMAL HIGH (ref 70–99)
Glucose-Capillary: 121 mg/dL — ABNORMAL HIGH (ref 70–99)

## 2024-06-05 MED ORDER — FLUDROCORTISONE ACETATE 0.1 MG PO TABS
0.2000 mg | ORAL_TABLET | Freq: Every day | ORAL | Status: DC
  Administered 2024-06-06 – 2024-06-19 (×14): 0.2 mg via ORAL
  Filled 2024-06-05 (×14): qty 2

## 2024-06-05 MED ORDER — METOCLOPRAMIDE HCL 5 MG PO TABS
5.0000 mg | ORAL_TABLET | Freq: Three times a day (TID) | ORAL | Status: DC
  Administered 2024-06-05 – 2024-06-19 (×42): 5 mg via ORAL
  Filled 2024-06-05 (×41): qty 1

## 2024-06-05 MED ORDER — FLUDROCORTISONE ACETATE 0.1 MG PO TABS
0.1000 mg | ORAL_TABLET | Freq: Once | ORAL | Status: AC
  Administered 2024-06-05: 0.1 mg via ORAL
  Filled 2024-06-05: qty 1

## 2024-06-05 MED ORDER — PROCHLORPERAZINE MALEATE 5 MG PO TABS
10.0000 mg | ORAL_TABLET | Freq: Three times a day (TID) | ORAL | Status: DC
  Administered 2024-06-05 – 2024-06-19 (×42): 10 mg via ORAL
  Filled 2024-06-05 (×41): qty 2

## 2024-06-05 NOTE — Progress Notes (Signed)
 PROGRESS NOTE   Subjective/Complaints:  Pt reports  nausea about the same- it occurs when eats- and gets up to 7-8/10 when it spikes- when smells the food.  Was able to eat 2 bowls of cheerios again this AM.   They offered to change his abd wound at 5am, but he declined.   Per PT, I spoke with, pt's BP 70s40s when standing with PT this AM- also OT was puttng on thigh high TEDs and ACE wraps this AM as well.    ROS:  Per HPI   Pt denies SOB, abd pain, CP, N/V/C/D, and vision changes    Objective:   No results found. Recent Labs    06/04/24 0501  WBC 8.7  HGB 10.3*  HCT 31.5*  PLT 205   Recent Labs    06/04/24 0501  NA 133*  K 3.7  CL 108  CO2 15*  GLUCOSE 89  BUN 13  CREATININE 1.28*  CALCIUM  9.2    Intake/Output Summary (Last 24 hours) at 06/05/2024 0919 Last data filed at 06/05/2024 0800 Gross per 24 hour  Intake --  Output 2275 ml  Net -2275 ml        Physical Exam: Vital Signs Blood pressure (!) 104/58, pulse 84, temperature 98.6 F (37 C), resp. rate 19, height 5' 11 (1.803 m), weight 92.5 kg, SpO2 98%.      General: awake, alert, appropriate, sitting EOB; OT putting on thigh high TEDs; wife at bedside; NAD HENT: conjugate gaze; oropharynx a little dry CV: regular rate and rhythm; no JVD Pulmonary: CTA B/L; no W/R/R- good air movement GI: soft,  mildly TTP- abd wound C/D/I;  dressing looks good; ostomies- just emptied;  Psychiatric: appropriate but slightly depressed affect Neurological: Ox3 Extremities: No significant LE edema  Skin: Wound on abdomen- C/D/I- dressing changed at 5am; - Spot on R side near 1 drain that looks like an old drain site- not clear if it is- oval around tip of pinky sized- mild slough and open wound- mildly erythematous around edges  Assessment/Plan: 1. Functional deficits which require 3+ hours per day of interdisciplinary therapy in a comprehensive  inpatient rehab setting. Physiatrist is providing close team supervision and 24 hour management of active medical problems listed below. Physiatrist and rehab team continue to assess barriers to discharge/monitor patient progress toward functional and medical goals  Care Tool:  Bathing    Body parts bathed by patient: Right arm, Left arm, Chest, Abdomen, Front perineal area, Right upper leg, Left upper leg, Face   Body parts bathed by helper: Buttocks, Right lower leg, Left lower leg     Bathing assist Assist Level: Minimal Assistance - Patient > 75%     Upper Body Dressing/Undressing Upper body dressing   What is the patient wearing?: Pull over shirt    Upper body assist Assist Level: Set up assist    Lower Body Dressing/Undressing Lower body dressing      What is the patient wearing?: Underwear/pull up     Lower body assist Assist for lower body dressing: Maximal Assistance - Patient 25 - 49%     Toileting Toileting Toileting Activity did not occur Press photographer  and hygiene only): N/A (no void or bm) (Urostomy/Colostomy)  Toileting assist       Transfers Chair/bed transfer  Transfers assist     Chair/bed transfer assist level: Supervision/Verbal cueing     Locomotion Ambulation   Ambulation assist      Assist level: Contact Guard/Touching assist Assistive device: Walker-rolling Max distance: 110'   Walk 10 feet activity   Assist  Walk 10 feet activity did not occur: Safety/medical concerns (pt too weak, fatigue)  Assist level: Contact Guard/Touching assist Assistive device: Walker-rolling   Walk 50 feet activity   Assist Walk 50 feet with 2 turns activity did not occur: Safety/medical concerns (pt too weak, fatigue)  Assist level: Contact Guard/Touching assist Assistive device: Walker-rolling    Walk 150 feet activity   Assist Walk 150 feet activity did not occur: Safety/medical concerns (pt too weak, fatigue)         Walk  10 feet on uneven surface  activity   Assist Walk 10 feet on uneven surfaces activity did not occur: Safety/medical concerns (pt too weak, fatigue)         Wheelchair     Assist Is the patient using a wheelchair?: Yes Type of Wheelchair: Manual    Wheelchair assist level: Total Assistance - Patient < 25% Max wheelchair distance: >400'    Wheelchair 50 feet with 2 turns activity    Assist    Wheelchair 50 feet with 2 turns activity did not occur:  (pt too weak, fatigue)   Assist Level: Total Assistance - Patient < 25%   Wheelchair 150 feet activity     Assist  Wheelchair 150 feet activity did not occur:  (pt too weak, fatigue)   Assist Level: Total Assistance - Patient < 25%   Blood pressure (!) 104/58, pulse 84, temperature 98.6 F (37 C), resp. rate 19, height 5' 11 (1.803 m), weight 92.5 kg, SpO2 98%.  Medical Problem List and Plan: 1. Functional deficits secondary to debility 2/2 perforated bowel             -patient may not shower due to wound vac             -ELOS/Goals: 10-14 days S             Admit to CIR             D/c 06/19/24  Con't CIR PT and OT Pt's OH is really limiting therapy as well as nausea Wife to get products ot help leakage from ostomies   2.  Antithrombotics: -DVT/anticoagulation:  Mechanical:  Antiembolism stockings, knee (TED hose) Bilateral lower extremities             -antiplatelet therapy: continue Eliquis  3. Pain Management: continue Scheduled Tylenol ; Oxycodone  as needed took 30mg  Oxy IR yesterday , 20mg  thus far today    7/17- took tylenol  this AM- pain down to 2-3/10- will con't regimen and monitor if needs more with therapy  7/22- Will change tylenol  to 500 mg BID and con't oxy prn  7/23- hasn't required pain meds overnight- doing better  7/24- pain doing better 4. Mood/Behavior/Sleep: LCSW to follow for evaluation and support when available.              -antipsychotic agents: N/A             -anxiety: continue  Atarax  25 mg  5. Neuropsych/cognition: This patient is capable of making decisions on his own behalf.   6. Complicated wound: WOC nurse  consultation             - Continue wound VAC             Zosyn  d/ced as per surgery recommendation   7/17- con't VAC- WOC to change- Tuesday and Friday per WOC note 7. 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 AKI/CKD stage IIIb: Creatinine improving 2.3 now 1.91 -monitor BMP   7/17- Cr 2.05- on IVF's 125/cc/hour- will continue for now and recheck labs in AM             - On continuous IVF NS 125 mL/hr   7/18- Will reduce IVFs to 75cc/hour-     Latest Ref Rng & Units 06/04/2024    5:01 AM 06/01/2024    5:22 AM 05/31/2024    5:51 AM  BMP  Glucose 70 - 99 mg/dL 89  92  84   BUN 8 - 23 mg/dL 13  20  26    Creatinine 0.61 - 1.24 mg/dL 8.71  8.91  8.89   Sodium 135 - 145 mmol/L 133  136  135   Potassium 3.5 - 5.1 mmol/L 3.7  3.7  3.6   Chloride 98 - 111 mmol/L 108  114  111   CO2 22 - 32 mmol/L 15  14  16    Calcium  8.9 - 10.3 mg/dL 9.2  8.6  8.8     2/79- reduce IVF to 50cc/hr Recent ECHO showed normal Ej fx, no concerns or signs of fluid overload at this time   7/22- will stop IVF's- IV is leaking and pt wants to try off it.   7/23- labs in AM  7/24- Cr up to 1.28- from 1.08- will recheck Monday- is up a little because pt not drinking as much, will push fluids still  7/25- encouraged nursing and therapy to push fluids even more today 9. DM type II: 04/01/24 Hemoglobin A1c 5.3  -Monitor BS AC/at bedtime and use SSI 7/17- will change  to regular diet and try to have pt make good choices- since poor intake- CBG's low 7/18- BG's 84-125- well controlled- con't regimen- probably low due to poor intake.  7/23- 7/24- great control- con't regimen 10.  Severe Hypotension: Order orthostatic vitals              - teds ordered             -increase midodrine  to 10mg  TID   7/17- BP running 80s systolic and feels lightheaded- Midodrine  was increased yesterday to 10 mg TID- if doesn't improve today, will order ACE wraps as well as Thigh high TED's- cannot do abd binder due to abd drains, VAC and ostomies- if this doesn't improve in next 24 hours, will add Florinef .  Also liberalized his diet to regular diet so can have some salt to help.   7/18- will add Florinef  0.1 mg daily- advised therapy to add ACE wraps- wasn't started yesterday- pt IS symptomatic-  will also continue IVFs for now Vitals:   06/04/24 2112 06/05/24 0546  BP: 97/63 (!) 104/58  Pulse: 78 84  Resp: 17 19  Temp: 98.5 F (36.9 C) 98.6 F (37 C)  SpO2: 100% 98%  Overall improved since 7/16 but remains soft cont IVF but at lower rate   7/22- Stopped IVFs since IV is leaking- pt wants ot get rid of IVF's- pt needs to drink 6-8 cups of milk juice or water /day- if labs get worse on Thursday, will restart IVFs. BP's running low 100's systolic- better than before.   7/23- per pt, less [facility]  7/24- still having OH per therapy- will d/w PT/OT before changing meds  7/25- BP 70/s40s standing this AM with ACE wraps and TEDs to thighs- will increase Florinef  to 0.2 mg since has been 1 week since started- will also have everyone push fluids 11. Stage I Obesity: BMI 31.49 kg/m  -educate on diet and weight loss to promote overall health and mobility.   7/17- BMI down to 28.44 7/22- BMI 28.44 12. Hyperlipidemia: Continue Crestor    13. Screening for vitamin D  deficiency:check vitamin D  level tomorrow 14. Hyponatremia  7/17- Na down to 128- but is on IVF's- hard to fluid restrict due to  Renal issues, so will keep on IVF's and recheck in AM  7/18- Na up to 134- will not intervene more  7/22- Na up to 136  7/24- Na 133 15. Poor appetite, nausea- frequent  7/18- will schedule low dose Compazine  before meals TID due to frequent nausea impacting his ability to eat meals. 7/22- still has severe loss of appetite, even though nausea is doing somewhat better- will Add Periacin 4 mg at bedtime- cannot add Remeron due to prolonged QT interval  7/23- will schedule Reglan  5 mg TID-AC to also help with nausea . 7/24- better appetite and nausea this AM- ate 2 bowls cheerios! Making gains 7/25- Will increase Compazine  to 10 mg before meals and changed toming of Reglan  to get 1 hour before meals as well    I spent a total of  53  minutes on total care today- >50% coordination of care- due to  D/w pt, OT< PT and nursing about fluids- OH; and will recheck labs in AM- severe OH -   LOS: 9 days A FACE TO FACE EVALUATION WAS PERFORMED  Kristopher Thompson 06/05/2024, 9:19 AM

## 2024-06-05 NOTE — Plan of Care (Signed)
  Problem: Consults Goal: RH GENERAL PATIENT EDUCATION Description: See Patient Education module for education specifics. Outcome: Progressing   Problem: RH BOWEL ELIMINATION Goal: RH STG MANAGE BOWEL WITH ASSISTANCE Description: STG Manage Bowel with minimal assistance Assistance. Outcome: Progressing   Problem: RH BLADDER ELIMINATION Goal: RH STG MANAGE BLADDER WITH ASSISTANCE Description: STG Manage Bladder With minimal  Assistance Outcome: Progressing   Problem: RH SKIN INTEGRITY Goal: RH STG SKIN FREE OF INFECTION/BREAKDOWN Description: Manage skin free of infection/breakdown with minimal assistance Outcome: Progressing

## 2024-06-05 NOTE — Progress Notes (Incomplete)
 Physical Therapy Session Note  Patient Details  Name: KYVON HU MRN: [id number] Date of Birth: 1951-10-10  Today's Date: 06/05/2024 PT Individual Time: 1300-1400 PT Individual Time Calculation (min): 60 min   Short Term Goals: Week 1:  PT Short Term Goal 1 (Week 1): = LTG Week 2:     Skilled Therapeutic Interventions/Progress Updates:    Patient in bed when PT arrived.  Pt states he felt better with the IV, I drank 6 glasses (water /fluid) today.  PT explained the benefit of independent control of fluid intake is the goal.   Therapy Documentation Precautions:  Precautions Precautions: Fall Recall of Precautions/Restrictions: Intact Precaution/Restrictions Comments: Urostomy/Colostomy, Ab wound vac, B nephrostomy drains Restrictions Weight Bearing Restrictions Per Provider Order: No    Pain: denies pain       Therapy/Group: Individual Therapy  [name] Fast 06/05/2024, 1:46 PM

## 2024-06-05 NOTE — Progress Notes (Addendum)
 Physical Therapy Session Note  Patient Details  Name: Kristopher Thompson MRN: [id number] Date of Birth: January 23, 1951  Today's Date: 06/05/2024 PT Individual Time: 0830-0930 PT Individual Time Calculation (min): 60 min   Short Term Goals: Week 1:  PT Short Term Goal 1 (Week 1): = LTG Week 2:     Skilled Therapeutic Interventions/Progress Updates:    Session 1: Pt up in w/c, agreeable for PT this  morning.  RN in process of med distribution.  Pt's wife present and states pt slept all night and is doing better today than couple days ago. Pt wheeled to gym to conserve energy.  BP 110/64 sitting, 73/58 standing.  Pt states his pain is starting to increase and requesting pain meds, but afraid pain meds will make him dizzy, but states he has not had pain meds for days, only tylenol .  Pt reports pain 3/10.  After extended rest, BP standing 82/66. Sit<->stand from w/c<->RW with supervision. Dr. Elidia notified of orthostatic hypotension.  Nsg reports pt's medication should kick in 30'.  Today session will focus on chair ex's.  LAQs, marching, step outs, ankle DF/PF; 2x10 each, leg presses with ball 2x10, hip add with ball x15.  Pt returned to room, performed LE ex's with yellow TB: HS curls x15-20 reps each, transferred w/c->bed with RW supervision for safety. Bed mobility min assist for LE placement, HOB transfer x2. Pt comfortable, wife present in room.    Session 2:  Pt in bed when PT arrived, pt agreeable for afternoon session.  Pt supine to EOB with min assist for trunk support.  Bed->W/C transfer with CGA (bed at lower height, per PT request). Pt able to self propel from room to hallway using UE/LE.  Pt then wheeled to gym for energy conservation. BP seated 103/68; standing 88/60.  Pt reports feeling increased dizziness this afternoon, however, able to participate.  Pt performed standing with RW horse shoe throws to focus on balance, coordination, endurance, strength. Pt able to finish before resting  in w/c.   Patient in bed when PT arrived.  Pt states he felt better with the IV, I drank 6 glasses (water /fluid) today.  PT explained the benefit of independent control of fluid intake and that is the goal. After extended rest, pt ambulating with RW back to room with x2 breaks and requesting to be taken back to his room.  Pt performed seated LE ex's with yellow TB: hip abd x10-20 reps, LAQs x10-15 reps, marching x10-15 reps. W/C to bed transfers with RW supervision, EOB->supine min assist for LE support and positioning in bed. PT removed ACE bandage and TED hose.  Pt comfortable and all items within reach, wife present.   Therapy Documentation Precautions:  Precautions Precautions: Fall Recall of Precautions/Restrictions: Intact Precaution/Restrictions Comments: Urostomy/Colostomy, Ab wound vac, B nephrostomy drains Restrictions Weight Bearing Restrictions Per Provider Order: No General:   Pain: Pain Assessment Pain Scale: 0-10 Pain Score: 1  Pain Location: Generalized     Therapy/Group: Individual Therapy  [name] Fast 06/05/2024, 8:46 AM

## 2024-06-05 NOTE — Progress Notes (Signed)
 Occupational Therapy Session Note  Patient Details  Name: Kristopher Thompson MRN: [id number] Date of Birth: November 22, 1950  Today's Date: 06/05/2024 OT Individual Time: 1102-1130 OT Individual Time Calculation (min): 28 min    Short Term Goals: Week 2:  OT Short Term Goal 1 (Week 2): Pt will complete transfers at [facility] consistently with LRAD OT Short Term Goal 2 (Week 2): Pt will complete ADL activity in standing for >3 min with LRAD OT Short Term Goal 3 (Week 2): Pt will complete LB dressing at [facility] with AE as required  Skilled Therapeutic Interventions/Progress Updates:   Patient received supine in bed.  Declined changing clothing.  Patient willing to work on sit to stand and standing.  Patient able to transition to edge of bed with min assist and increased time with head of bed slightly raised.  Patient able to don slip on shoes with min assist despite feet / legs ace wrapped. Sit to stand quick succession x 5 2 sets.  Worked on sit to stand form slightly lower surface.  Worked on marching in place x 10 seconds in standing.  Patient needs mod assist to get BLE back in bed.  Left LE rewrapped with ace wrap.  Bed alarm engaged and call bell, personal items in reach.   Therapy Documentation Precautions:  Precautions Precautions: Fall Recall of Precautions/Restrictions: Intact Precaution/Restrictions Comments: Urostomy/Colostomy, Ab wound vac, B nephrostomy drains Restrictions Weight Bearing Restrictions Per Provider Order: No Pain:  Denies pain    Therapy/Group: Individual Therapy  Jayshawn Colston M 06/05/2024, 12:59 PM

## 2024-06-05 NOTE — Progress Notes (Signed)
 Occupational Therapy Session Note  Patient Details  Name: Kristopher Thompson MRN: [id number] Date of Birth: 1951-10-30  Today's Date: 06/05/2024 OT Individual Time: 9265-9179 OT Individual Time Calculation (min): 46 min    Short Term Goals: Week 2:  OT Short Term Goal 1 (Week 2): Pt will complete transfers at [facility] consistently with LRAD OT Short Term Goal 2 (Week 2): Pt will complete ADL activity in standing for >3 min with LRAD OT Short Term Goal 3 (Week 2): Pt will complete LB dressing at [facility] with AE as required  Skilled Therapeutic Interventions/Progress Updates:  Pt greeted semi reclined in bed, pt agreeable to OT intervention.      Transfers/bed mobility/functional mobility:  Pt completed supine>sit with CGA with increased time. Pt completed sit>stand from EOB with CGA, stand pivot to w/c with  rolling walker with CGA.  BP from w/c with ace wraps and teds- 92/75( 80) Hr 120 bpm.  Pt completed functional ambulation back towards room ~ 30 ft with RW and CGA, chair follow provided.    ADLs:  Grooming: pt completed steated grooming at sink MODI.   LB dressing: donned teds with MAX A with pt assisting with pulling teds up from sitting EOB.   Exercises: pt completed below BLE therex to facilitate improved strength and endurance for higher level ADLs, pt completed therex for 1 min with 30 sec rest break in between: Seated isometric ball squeeze for adductor activation Seated abduction presses against level 3 resistance band                  Ended session with pt seated in w/c with all needs within reach.         Therapy Documentation Precautions:  Precautions Precautions: Fall Recall of Precautions/Restrictions: Intact Precaution/Restrictions Comments: Urostomy/Colostomy, Ab wound vac, B nephrostomy drains Restrictions Weight Bearing Restrictions Per Provider Order: No  Pain: Unrated pain/soreness reported in BUEs, rest breaks provided as needed and catered session to LB  strength to allow pt to rest UB.  Therapy/Group: Individual Therapy  Ronal Gift [facility] 06/05/2024, 8:49 AM

## 2024-06-06 DIAGNOSIS — F4321 Adjustment disorder with depressed mood: Secondary | ICD-10-CM | POA: Diagnosis not present

## 2024-06-06 DIAGNOSIS — N1832 Chronic kidney disease, stage 3b: Secondary | ICD-10-CM

## 2024-06-06 DIAGNOSIS — I951 Orthostatic hypotension: Secondary | ICD-10-CM | POA: Diagnosis not present

## 2024-06-06 DIAGNOSIS — E46 Unspecified protein-calorie malnutrition: Secondary | ICD-10-CM

## 2024-06-06 DIAGNOSIS — R5381 Other malaise: Secondary | ICD-10-CM | POA: Diagnosis not present

## 2024-06-06 LAB — BASIC METABOLIC PANEL WITH GFR
Anion gap: 11 (ref 5–15)
BUN: 28 mg/dL — ABNORMAL HIGH (ref 8–23)
CO2: 17 mmol/L — ABNORMAL LOW (ref 22–32)
Calcium: 10 mg/dL (ref 8.9–10.3)
Chloride: 107 mmol/L (ref 98–111)
Creatinine, Ser: 1.34 mg/dL — ABNORMAL HIGH (ref 0.61–1.24)
GFR, Estimated: 57 mL/min — ABNORMAL LOW (ref >60)
Glucose, Bld: 85 mg/dL (ref 70–99)
Potassium: 4.4 mmol/L (ref 3.5–5.1)
Sodium: 135 mmol/L (ref 135–145)

## 2024-06-06 LAB — GLUCOSE, CAPILLARY
Glucose-Capillary: 106 mg/dL — ABNORMAL HIGH (ref 70–99)
Glucose-Capillary: 95 mg/dL (ref 70–99)
Glucose-Capillary: 110 mg/dL — ABNORMAL HIGH (ref 70–99)

## 2024-06-06 MED ORDER — SODIUM CHLORIDE 0.45 % IV SOLN: INTRAVENOUS | Status: DC

## 2024-06-06 NOTE — Progress Notes (Signed)
 Physical Therapy Session Note  Patient Details  Name: Kristopher Thompson MRN: [id number] Date of Birth: 1951-03-19  Today's Date: 06/06/2024 PT Individual Time: 1400-1500 PT Individual Time Calculation (min): 60 min   Short Term Goals: Week 1:  PT Short Term Goal 1 (Week 1): = LTG  Skilled Therapeutic Interventions/Progress Updates:    Pt in bed when PT arrived, pt agreeable for session. Pt's pastor and wife present.  PT donned TED and ACE bandage total dep. Pt performed supine SLR 2x10 , hip abd/add x10, manual leg presses x15.  Transfer supine to sit with min assist for trunk support, pt requires railing for support.  Bed->RW->W/C supervision.  BP in supine: 110/72; EOB 93/57, standing 87/62; w/c 113/72.   Pt wheeled self towards gym ~x90' using UE/LE.  PT wheeled pt to gym.  Pt performed standing with RW including heel raises 2x10, marching x10, forward leg kicks x10. Pt requires x3 rest breaks.  Provided pt water  to drink during session.  End of session, pt wheeled back to room, up in w/c all items within reach.  PT returned ~70 minutes to assist pt back to bed, remove ted/ace bandages, all items within reach.     Therapy Documentation Precautions:  Precautions Precautions: Fall Recall of Precautions/Restrictions: Intact Precaution/Restrictions Comments: Urostomy/Colostomy, Ab wound vac, B nephrostomy drains Restrictions Weight Bearing Restrictions Per Provider Order: No  Pain: pt denies pain      Therapy/Group: Individual Therapy  [name] Fast 06/06/2024, 3:53 PM

## 2024-06-06 NOTE — Progress Notes (Signed)
 Occupational Therapy Session Note  Patient Details  Name: MICKEY ESGUERRA MRN: [id number] Date of Birth: July 30, 1951  Today's Date: 06/06/2024 OT Individual Time: 1100-1200 OT Individual Time Calculation (min): 60 min    Short Term Goals: Week 1:  OT Short Term Goal 1 (Week 1): Pt will perform stand-step transfer with Min A + RW. OT Short Term Goal 1 - Progress (Week 1): Met OT Short Term Goal 2 (Week 1): Pt will thread LB garment with Min A + LRAD. OT Short Term Goal 2 - Progress (Week 1): Met OT Short Term Goal 3 (Week 1): Pt will tolerate >2 mins of standing activity for carryover in ADLs. OT Short Term Goal 3 - Progress (Week 1): Met  Skilled Therapeutic Interventions/Progress Updates:    Patient resting in bed with family present at the time of treatment. Patient indicated that he rested okay during the night and that he had a pain response of a 2 on 0-10 for his  right side.  The pt was able to come from supine in bed to EOB with MinA, presented as  MinA  also for transferring to w/c LOF using the RW with additional time. The pt was transported to the sink and was able wash his face, brush his hair and teeth with s/uA. The pt was able to doff his over head shirt with MinA. The  pt was able to bathe his UB with s/uA at sink LOF.  The pt was able put on deo with s/uA and he went on to donn his over head shirt requiring  MinA. Following UB BADL,the pt requested to be returned to  bed LOF at [facility] using the RW and rails of the bed for placement EOB.  The pt was MinA for managing BLE onto the bed.  The pt was able to be repositioned up in bed with MinA. Prior to exiting the room,the call light and bedside table were placed within reach with all additional  needs addressed  Therapy Documentation Precautions:  Precautions Precautions: Fall Recall of Precautions/Restrictions: Intact Precaution/Restrictions Comments: Urostomy/Colostomy, Ab wound vac, B nephrostomy drains Restrictions Weight  Bearing Restrictions Per Provider Order: No   Therapy/Group: Individual Therapy  Elvera JONETTA Mace 06/06/2024, 12:31 PM

## 2024-06-06 NOTE — Progress Notes (Signed)
 PROGRESS NOTE   Subjective/Complaints:  Still some nausea, seems to be associated with meals, smells. Nothing's really changes this morning when I approached him  ROS: Patient denies fever, rash, sore throat, blurred vision, dizziness,  diarrhea, cough, shortness of breath or chest pain, joint or back/neck pain, headache, or mood change.    Objective:   No results found. Recent Labs    06/04/24 0501  WBC 8.7  HGB 10.3*  HCT 31.5*  PLT 205   Recent Labs    06/04/24 0501 06/06/24 0525  NA 133* 135  K 3.7 4.4  CL 108 107  CO2 15* 17*  GLUCOSE 89 85  BUN 13 28*  CREATININE 1.28* 1.34*  CALCIUM  9.2 10.0    Intake/Output Summary (Last 24 hours) at 06/06/2024 0941 Last data filed at 06/06/2024 0906 Gross per 24 hour  Intake 240 ml  Output 1235 ml  Net -995 ml        Physical Exam: Vital Signs Blood pressure 106/66, pulse 87, temperature 98.2 F (36.8 C), temperature source Oral, resp. rate 16, height 5' 11 (1.803 m), weight 92.5 kg, SpO2 98%.      Constitutional: No distress . Vital signs reviewed. HEENT: NCAT, EOMI, oral membranes moist Neck: supple Cardiovascular: RRR without murmur. No JVD    Respiratory/Chest: CTA Bilaterally without wheezes or rales. Normal effort    GI/Abdomen: BS +, non-tender, non-distended Ext: no clubbing, cyanosis, or edema Psych: flat but cooperative  Neurological: Ox3 Extremities: No significant LE edema  Skin: Wound on abdomen- abdominal wound with almost 100% pink granulation tissue, some loose slough at lower end of wound which could be wiped off; - both drains intact, one with about 10cc of greenish-yellow fluid, the other was essentially empty. Ostomy was intact with stool present.   Assessment/Plan: 1. Functional deficits which require 3+ hours per day of interdisciplinary therapy in a comprehensive inpatient rehab setting. Physiatrist is providing close team  supervision and 24 hour management of active medical problems listed below. Physiatrist and rehab team continue to assess barriers to discharge/monitor patient progress toward functional and medical goals  Care Tool:  Bathing    Body parts bathed by patient: Right arm, Left arm, Chest, Abdomen, Front perineal area, Right upper leg, Left upper leg, Face   Body parts bathed by helper: Buttocks, Right lower leg, Left lower leg     Bathing assist Assist Level: Minimal Assistance - Patient > 75%     Upper Body Dressing/Undressing Upper body dressing   What is the patient wearing?: Pull over shirt    Upper body assist Assist Level: Set up assist    Lower Body Dressing/Undressing Lower body dressing      What is the patient wearing?: Underwear/pull up     Lower body assist Assist for lower body dressing: Maximal Assistance - Patient 25 - 49%     Toileting Toileting Toileting Activity did not occur (Clothing management and hygiene only): N/A (no void or bm) (Urostomy/Colostomy)  Toileting assist       Transfers Chair/bed transfer  Transfers assist     Chair/bed transfer assist level: Supervision/Verbal cueing     Locomotion Ambulation   Ambulation  assist      Assist level: Contact Guard/Touching assist Assistive device: Walker-rolling Max distance: 80'   Walk 10 feet activity   Assist  Walk 10 feet activity did not occur: Safety/medical concerns (pt too weak, fatigue)  Assist level: Contact Guard/Touching assist Assistive device: Walker-rolling   Walk 50 feet activity   Assist Walk 50 feet with 2 turns activity did not occur: Safety/medical concerns (pt too weak, fatigue)  Assist level: Contact Guard/Touching assist Assistive device: Walker-rolling    Walk 150 feet activity   Assist Walk 150 feet activity did not occur: Safety/medical concerns (pt too weak, fatigue)         Walk 10 feet on uneven surface  activity   Assist Walk 10 feet  on uneven surfaces activity did not occur: Safety/medical concerns (pt too weak, fatigue)         Wheelchair     Assist Is the patient using a wheelchair?: Yes Type of Wheelchair: Manual    Wheelchair assist level: Contact Guard/Touching assist Max wheelchair distance: 30'    Wheelchair 50 feet with 2 turns activity    Assist    Wheelchair 50 feet with 2 turns activity did not occur:  (pt too weak, fatigue)   Assist Level: Total Assistance - Patient < 25%   Wheelchair 150 feet activity     Assist  Wheelchair 150 feet activity did not occur:  (pt too weak, fatigue)   Assist Level: Total Assistance - Patient < 25%   Blood pressure 106/66, pulse 87, temperature 98.2 F (36.8 C), temperature source Oral, resp. rate 16, height 5' 11 (1.803 m), weight 92.5 kg, SpO2 98%.  Medical Problem List and Plan: 1. Functional deficits secondary to debility 2/2 perforated bowel             -patient may not shower due to wound vac             -ELOS/Goals: 10-14 days S            D/c 06/19/24  Pt's OH is really limiting therapy as well as nausea Wife to get products ot help leakage from ostomies  -Continue CIR therapies including PT, OT  2.  Antithrombotics: -DVT/anticoagulation:  Mechanical:  Antiembolism stockings, knee (TED hose) Bilateral lower extremities             -antiplatelet therapy: continue Eliquis  3. Pain Management: continue Scheduled Tylenol ; Oxycodone  as needed took 30mg  Oxy IR yesterday , 20mg  thus far today    7/17- took tylenol  this AM- pain down to 2-3/10- will con't regimen and monitor if needs more with therapy  7/22- Will change tylenol  to 500 mg BID and con't oxy prn  7/23- hasn't required pain meds overnight- doing better  7/24-26- pain doing better 4. Mood/Behavior/Sleep: LCSW to follow for evaluation and support when available.              -antipsychotic agents: N/A             -anxiety: continue Atarax  25 mg  5. Neuropsych/cognition: This  patient is capable of making decisions on his own behalf.   6. Complicated wound: WOC nurse consultation             - Continue wound VAC             Zosyn  d/ced as per surgery recommendation   7/26 hydrogel and gauze dressing in place. Midline abd wound looks great 7. 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 He's willing to eat   8. AKI/CKD stage IIIb: Creatinine improving 2.3 now 1.91 -monitor BMP   7/17- Cr 2.05- on IVF's 125/cc/hour- will continue for now and recheck labs in AM             - On continuous IVF NS 125 mL/hr   7/18- Will reduce IVFs to 75cc/hour-     Latest Ref Rng & Units 06/06/2024    5:25 AM 06/04/2024    5:01 AM 06/01/2024    5:22 AM  BMP  Glucose 70 - 99 mg/dL 85  89  92   BUN 8 - 23 mg/dL 28  13  20    Creatinine 0.61 - 1.24 mg/dL 8.65  8.71  8.91   Sodium 135 - 145 mmol/L 135  133  136   Potassium 3.5 - 5.1 mmol/L 4.4  3.7  3.7   Chloride 98 - 111 mmol/L 107  108  114   CO2 22 - 32 mmol/L 17  15  14    Calcium  8.9 - 10.3 mg/dL 89.9  9.2  8.6     2/79- reduce IVF to 50cc/hr Recent ECHO showed normal Ej fx, no concerns or signs of fluid overload at this time   7/22- will stop IVF's- IV is leaking and pt wants to try off it.   7/23- labs in AM  7/24- Cr up to 1.28- from 1.08- will recheck Monday- is up a little because pt not drinking as much, will push fluids still  7/26 BUN/Cr trending back up   -will give IVF at night for a couple nights.  9. DM type II: 04/01/24 Hemoglobin A1c 5.3  -Monitor BS AC/at bedtime and  use SSI 7/17- will change to regular diet and try to have pt make good choices- since poor intake- CBG's low 7/18- BG's 84-125- well controlled- con't regimen- probably low due to poor intake.  CBG (last 3)  Recent Labs    06/05/24 1150 06/05/24 1620 06/05/24 2047  GLUCAP 104* 111* 121*    7/26 tight control 10.  Severe Hypotension: Order orthostatic vitals              - teds ordered             -increase midodrine  to 10mg  TID   7/17- BP running 80s systolic and feels lightheaded- Midodrine  was increased yesterday to 10 mg TID- if doesn't improve today, will order ACE wraps as well as Thigh high TED's- cannot do abd binder due to abd drains, VAC and ostomies- if this doesn't improve in next 24 hours, will add Florinef .  Also liberalized his diet to regular diet so can have some salt to help.   7/18- will add Florinef  0.1 mg daily- advised therapy to add ACE wraps- wasn't started yesterday- pt IS symptomatic- will also continue IVFs for now Vitals:   06/05/24 1955 06/06/24 0536  BP: 104/60 106/66  Pulse: 81 87  Resp: 18 16  Temp: 98.2 F (36.8 C) 98.2 F (36.8 C)  SpO2:  98% 98%  Overall improved since 7/16 but remains soft cont IVF but at lower rate   7/22- Stopped IVFs since IV is leaking- pt wants ot get rid of IVF's- pt needs to drink 6-8 cups of milk juice or water /day- if labs get worse on Thursday, will restart IVFs. BP's running low 100's systolic- better than before.   7/23- per pt, less [facility]  7/24- still having OH per therapy- will d/w PT/OT before changing meds  7/25- BP 70/s40s standing this AM with ACE wraps and TEDs to thighs- will increase Florinef  to 0.2 mg since has been 1 week since started- will also have everyone push fluids  7/26- no orthostatic vs this morning--will order again 11. Stage I Obesity: BMI 31.49 kg/m  -educate on diet and weight loss to promote overall health and mobility.   7/17- BMI down to 28.44 7/22- BMI 28.44 12. Hyperlipidemia: Continue  Crestor    13. Screening for vitamin D  deficiency:check vitamin D  level tomorrow 14. Hyponatremia  7/17- Na down to 128- but is on IVF's- hard to fluid restrict due to Renal issues, so will keep on IVF's and recheck in AM  7/18- Na up to 134- will not intervene more  7/22- Na up to 136  7/24- Na 133 15. Poor appetite, nausea- frequent  7/18- will schedule low dose Compazine  before meals TID due to frequent nausea impacting his ability to eat meals. 7/22- still has severe loss of appetite, even though nausea is doing somewhat better- will Add Periacin 4 mg at bedtime- cannot add Remeron due to prolonged QT interval  7/23- will schedule Reglan  5 mg TID-AC to also help with nausea . 7/24- better appetite and nausea this AM- ate 2 bowls cheerios! Making gains 7/25- Will increase Compazine  to 10 mg before meals and changed toming of Reglan  to get 1 hour before meals as well   7/26--ate 100% breakfast last two days, nothing else recorded   -encourage intake at other meals.       LOS: 10 days A FACE TO FACE EVALUATION WAS PERFORMED  Kristopher Thompson 06/06/2024, 9:41 AM

## 2024-06-07 DIAGNOSIS — I951 Orthostatic hypotension: Secondary | ICD-10-CM | POA: Diagnosis not present

## 2024-06-07 DIAGNOSIS — N1832 Chronic kidney disease, stage 3b: Secondary | ICD-10-CM | POA: Diagnosis not present

## 2024-06-07 DIAGNOSIS — F4321 Adjustment disorder with depressed mood: Secondary | ICD-10-CM | POA: Diagnosis not present

## 2024-06-07 DIAGNOSIS — R5381 Other malaise: Secondary | ICD-10-CM | POA: Diagnosis not present

## 2024-06-07 LAB — GLUCOSE, CAPILLARY
Glucose-Capillary: 90 mg/dL (ref 70–99)
Glucose-Capillary: 106 mg/dL — ABNORMAL HIGH (ref 70–99)
Glucose-Capillary: 101 mg/dL — ABNORMAL HIGH (ref 70–99)
Glucose-Capillary: 115 mg/dL — ABNORMAL HIGH (ref 70–99)

## 2024-06-07 MED ORDER — SODIUM CHLORIDE 0.45 % IV SOLN: INTRAVENOUS | Status: DC

## 2024-06-07 NOTE — Progress Notes (Signed)
 PROGRESS NOTE   Subjective/Complaints:  Still doesn't have much of an appetite. Pain fairly well controlled.   ROS: Patient denies fever, rash, sore throat, blurred vision, dizziness,  vomiting, diarrhea, cough, shortness of breath or chest pain, joint or back/neck pain, headache, or mood change.    Objective:   No results found. No results for input(s): WBC, HGB, HCT, PLT in the last 72 hours.  Recent Labs    06/06/24 0525  NA 135  K 4.4  CL 107  CO2 17*  GLUCOSE 85  BUN 28*  CREATININE 1.34*  CALCIUM  10.0    Intake/Output Summary (Last 24 hours) at 06/07/2024 0952 Last data filed at 06/07/2024 0813 Gross per 24 hour  Intake 980.87 ml  Output 1150 ml  Net -169.13 ml        Physical Exam: Vital Signs Blood pressure 103/64, pulse 94, temperature 98.4 F (36.9 C), temperature source Oral, resp. rate 16, height 5' 11 (1.803 m), weight 92.5 kg, SpO2 98%.      Constitutional: No distress . Vital signs reviewed. HEENT: NCAT, EOMI, oral membranes moist Neck: supple Cardiovascular: RRR without murmur. No JVD    Respiratory/Chest: CTA Bilaterally without wheezes or rales. Normal effort    GI/Abdomen: BS +, non-tender, non-distended Ext: no clubbing, cyanosis, or edema Psych: a little more animated today. pleasant and cooperative  Neurological: Ox3 Extremities: No significant LE edema Skin: Wound on abdomen- abdominal wound with pink granulation tissue, with small amount of loose slough at lower end of wound - both drains intact, one with about 10cc of greenish-yellow fluid, the other was essentially empty. Ostomy was intact with small amount of stool present.   Assessment/Plan: 1. Functional deficits which require 3+ hours per day of interdisciplinary therapy in a comprehensive inpatient rehab setting. Physiatrist is providing close team supervision and 24 hour management of active medical problems  listed below. Physiatrist and rehab team continue to assess barriers to discharge/monitor patient progress toward functional and medical goals  Care Tool:  Bathing    Body parts bathed by patient: Right arm, Left arm, Chest, Abdomen, Front perineal area, Right upper leg, Left upper leg, Face   Body parts bathed by helper: Buttocks, Right lower leg, Left lower leg     Bathing assist Assist Level: Minimal Assistance - Patient > 75%     Upper Body Dressing/Undressing Upper body dressing   What is the patient wearing?: Pull over shirt    Upper body assist Assist Level: Set up assist    Lower Body Dressing/Undressing Lower body dressing      What is the patient wearing?: Underwear/pull up     Lower body assist Assist for lower body dressing: Maximal Assistance - Patient 25 - 49%     Toileting Toileting Toileting Activity did not occur (Clothing management and hygiene only): N/A (no void or bm) (Urostomy/Colostomy)  Toileting assist       Transfers Chair/bed transfer  Transfers assist     Chair/bed transfer assist level: Supervision/Verbal cueing     Locomotion Ambulation   Ambulation assist      Assist level: Contact Guard/Touching assist Assistive device: Walker-rolling Max distance: 11'   Walk  10 feet activity   Assist  Walk 10 feet activity did not occur: Safety/medical concerns (pt too weak, fatigue)  Assist level: Contact Guard/Touching assist Assistive device: Walker-rolling   Walk 50 feet activity   Assist Walk 50 feet with 2 turns activity did not occur: Safety/medical concerns (pt too weak, fatigue)  Assist level: Contact Guard/Touching assist Assistive device: Walker-rolling    Walk 150 feet activity   Assist Walk 150 feet activity did not occur: Safety/medical concerns (pt too weak, fatigue)         Walk 10 feet on uneven surface  activity   Assist Walk 10 feet on uneven surfaces activity did not occur: Safety/medical  concerns (pt too weak, fatigue)         Wheelchair     Assist Is the patient using a wheelchair?: Yes Type of Wheelchair: Manual    Wheelchair assist level: Supervision/Verbal cueing Max wheelchair distance: 42'    Wheelchair 50 feet with 2 turns activity    Assist    Wheelchair 50 feet with 2 turns activity did not occur:  (pt too weak, fatigue)   Assist Level: Total Assistance - Patient < 25%   Wheelchair 150 feet activity     Assist  Wheelchair 150 feet activity did not occur:  (pt too weak, fatigue)   Assist Level: Total Assistance - Patient < 25%   Blood pressure 103/64, pulse 94, temperature 98.4 F (36.9 C), temperature source Oral, resp. rate 16, height 5' 11 (1.803 m), weight 92.5 kg, SpO2 98%.  Medical Problem List and Plan: 1. Functional deficits secondary to debility 2/2 perforated bowel             -patient may not shower due to wound vac             -ELOS/Goals: 10-14 days S            D/c 06/19/24  Pt's OH is really limiting therapy as well as nausea Wife to get products ot help leakage from ostomies  -Continue CIR therapies including PT, OT  2.  Antithrombotics: -DVT/anticoagulation:  Mechanical:  Antiembolism stockings, knee (TED hose) Bilateral lower extremities             -antiplatelet therapy: continue Eliquis  3. Pain Management: continue Scheduled Tylenol ; Oxycodone  as needed took 30mg  Oxy IR yesterday , 20mg  thus far today    7/17- took tylenol  this AM- pain down to 2-3/10- will con't regimen and monitor if needs more with therapy  7/22- Will change tylenol  to 500 mg BID and con't oxy prn  7/23- hasn't required pain meds overnight- doing better  7/24-27- pain control improved 4. Mood/Behavior/Sleep: LCSW to follow for evaluation and support when available.              -antipsychotic agents: N/A             -anxiety: continue Atarax  25 mg  5. Neuropsych/cognition: This patient is capable of making decisions on his own behalf.   6.  Complicated wound: WOC nurse consultation             - Continue wound VAC             Zosyn  d/ced as per surgery recommendation   7/26-7 hydrogel and gauze dressing in place. Midline abd wound looks great 7. Fluids/Electrolytes/Nutrition: Monitor I's & O's--Recheck monitor labs routinely CBC/CMP              - Bowel regimen for ileostomy: Continue FiberCon and  loperamide  twice daily Goal is [phone] mL out fecal ileostomy a day              - Carb/cardiac diet -continue Juven/Prosource/Ensure supplements              -Nausea/vomiting:  seems to have improved- PRN orders Reglan  and Zofran  7/24- made Reglan  scheduled 5 mg TID-AC- and nausea is somewhat better this AM 7/26-27  nausea seems a bit better.   -doesn't have much appetite but is trying to eat -ate 100-100-25% yesterday   8. AKI/CKD stage IIIb: Creatinine improving 2.3 now 1.91 -monitor BMP   7/17- Cr 2.05- on IVF's 125/cc/hour- will continue for now and recheck labs in AM             - On continuous IVF NS 125 mL/hr   7/18- Will reduce IVFs to 75cc/hour-     Latest Ref Rng & Units 06/06/2024    5:25 AM 06/04/2024    5:01 AM 06/01/2024    5:22 AM  BMP  Glucose 70 - 99 mg/dL 85  89  92   BUN 8 - 23 mg/dL 28  13  20    Creatinine 0.61 - 1.24 mg/dL 8.65  8.71  8.91   Sodium 135 - 145 mmol/L 135  133  136   Potassium 3.5 - 5.1 mmol/L 4.4  3.7  3.7   Chloride 98 - 111 mmol/L 107  108  114   CO2 22 - 32 mmol/L 17  15  14    Calcium  8.9 - 10.3 mg/dL 89.9  9.2  8.6     2/79- reduce IVF to 50cc/hr Recent ECHO showed normal Ej fx, no concerns or signs of fluid overload at this time   7/22- will stop IVF's- IV is leaking and pt wants to try off it.   7/23- labs in AM  7/24- Cr up to 1.28- from 1.08- will recheck Monday- is up a little because pt not drinking as much, will push fluids still  7/27 BUN/Cr   back up yesterday   -continue IVF tonight.  9. DM type II: 04/01/24 Hemoglobin A1c 5.3  -Monitor BS AC/at bedtime and use SSI 7/17-  will change to regular diet and try to have pt make good choices- since poor intake- CBG's low 7/18- BG's 84-125- well controlled- con't regimen- probably low due to poor intake.  CBG (last 3)  Recent Labs    06/06/24 1644 06/06/24 2048 06/07/24 0552  GLUCAP 95 110* 90    7/27 tight control 10.  Severe Hypotension: Order orthostatic vitals              - teds ordered             -increase midodrine  to 10mg  TID   7/17- BP running 80s systolic and feels lightheaded- Midodrine  was increased yesterday to 10 mg TID- if doesn't improve today, will order ACE wraps as well as Thigh high TED's- cannot do abd binder due to abd drains, VAC and ostomies- if this doesn't improve in next 24 hours, will add Florinef .  Also liberalized his diet to regular diet so can have some salt to help.   7/18- will add Florinef  0.1 mg daily- advised therapy to add ACE wraps- wasn't started yesterday- pt IS symptomatic- will also continue IVFs for now Vitals:   06/06/24 1946 06/07/24 0510  BP: 100/65 103/64  Pulse: 86 94  Resp: 18 16  Temp: 98.8 F (37.1 C) 98.4 F (36.9 C)  SpO2:  99% 98%  Overall improved since 7/16 but remains soft cont IVF but at lower rate   7/22- Stopped IVFs since IV is leaking- pt wants ot get rid of IVF's- pt needs to drink 6-8 cups of milk juice or water /day- if labs get worse on Thursday, will restart IVFs. BP's running low 100's systolic- better than before.   7/23- per pt, less [facility]  7/24- still having OH per therapy- will d/w PT/OT before changing meds  7/25- BP 70/s40s standing this AM with ACE wraps and TEDs to thighs- will increase Florinef  to 0.2 mg since has been 1 week since started- will also have everyone push fluids  7/27 sitting and lying readings were almost equal yesterday 11. Stage I Obesity: BMI 31.49 kg/m  -educate on diet and weight loss to promote overall health and mobility.   7/17- BMI down to 28.44 7/22- BMI 28.44 12. Hyperlipidemia: Continue Crestor    13.  Screening for vitamin D  deficiency:check vitamin D  level tomorrow 14. Hyponatremia  7/17- Na down to 128- but is on IVF's- hard to fluid restrict due to Renal issues, so will keep on IVF's and recheck in AM  7/18- Na up to 134- will not intervene more  7/22- Na up to 136  7/26- Na 135 15. Poor appetite, nausea- frequent  7/18- will schedule low dose Compazine  before meals TID due to frequent nausea impacting his ability to eat meals. 7/22- still has severe loss of appetite, even though nausea is doing somewhat better- will Add Periacin 4 mg at bedtime- cannot add Remeron due to prolonged QT interval  7/23- will schedule Reglan  5 mg TID-AC to also help with nausea . 7/24- better appetite and nausea this AM- ate 2 bowls cheerios! Making gains 7/25- Will increase Compazine  to 10 mg before meals and changed toming of Reglan  to get 1 hour before meals as well   7/27--ate 100-100-25% yesterday   -he's trying!   -encouraged wife to bring in food from home/outside   -continue periactin        LOS: 11 days A FACE TO FACE EVALUATION WAS PERFORMED  Kristopher Thompson 06/07/2024, 9:52 AM

## 2024-06-07 NOTE — Progress Notes (Signed)
 Pt stated that he prefers to have his orthostatic vitals done with therapy.

## 2024-06-07 NOTE — Progress Notes (Signed)
 Occupational Therapy Session Note  Patient Details  Name: Kristopher Thompson MRN: [id number] Date of Birth: 1951/06/14  Today's Date: 06/07/2024 OT Individual Time: 0920-1015 OT Individual Time Calculation (min): 55 min    Short Term Goals: Week 2:  OT Short Term Goal 1 (Week 2): Pt will complete transfers at [facility] consistently with LRAD OT Short Term Goal 2 (Week 2): Pt will complete ADL activity in standing for >3 min with LRAD OT Short Term Goal 3 (Week 2): Pt will complete LB dressing at [facility] with AE as required  Skilled Therapeutic Interventions/Progress Updates:      Therapy Documentation Precautions:  Precautions Precautions: Fall Recall of Precautions/Restrictions: Intact Precaution/Restrictions Comments: Urostomy/Colostomy, Ab wound vac, B nephrostomy drains Restrictions Weight Bearing Restrictions Per Provider Order: No General: Pt supine in bed upon OT arrival, agreeable to OT session.  Pain: no pain reported  ADL: Pt wife assisting with managing emptying Rt nephrostomy and ileostomy bags with independence. OT assisting with managing TED hose while seated EOB donning/doffing at total A, pt able to slide on tennis shoes at mod I. Pt able to get out to bed with HOB raised and increased time required  Exercises: Pt completed the following exercise circuit in order to improve functional activity, strength and endurance to prepare for ADLs such as bathing. Pt completed the following exercises in seated/standing position with no noted LOB/SOB and various repetitions on each exercise: - sit to stands 3x5, rest breaks in between trials   Other Treatments: OT providing handout for energy conservation describing what it is, purpose of conserving energy and specific examples of how to conserve energy during ADL and IADL tasks. Pt appreciative for information. OT educating wife and pt on bed rails pt can use for home. OT noting loose seal on tape for ileostomy and urostomy. Nsg  notified.    Pt supine in bed with bed alarm activated, 2 bed rails up, call light within reach and 4Ps assessed.   Therapy/Group: Individual Therapy  Camie Hoe, OTD, OTR/L 06/07/2024, 12:29 PM

## 2024-06-08 DIAGNOSIS — R5381 Other malaise: Secondary | ICD-10-CM | POA: Diagnosis not present

## 2024-06-08 LAB — CBC WITH DIFFERENTIAL/PLATELET
Abs Immature Granulocytes: 0.06 K/uL (ref 0.00–0.07)
Basophils Absolute: 0.1 K/uL (ref 0.0–0.1)
Basophils Relative: 1 %
Eosinophils Absolute: 1.1 K/uL — ABNORMAL HIGH (ref 0.0–0.5)
Eosinophils Relative: 14 %
HCT: 33.3 % — ABNORMAL LOW (ref 39.0–52.0)
Hemoglobin: 11 g/dL — ABNORMAL LOW (ref 13.0–17.0)
Immature Granulocytes: 1 %
Lymphocytes Relative: 19 %
Lymphs Abs: 1.5 K/uL (ref 0.7–4.0)
MCH: 28.9 pg (ref 26.0–34.0)
MCHC: 33 g/dL (ref 30.0–36.0)
MCV: 87.6 fL (ref 80.0–100.0)
Monocytes Absolute: 0.9 K/uL (ref 0.1–1.0)
Monocytes Relative: 11 %
Neutro Abs: 4.3 K/uL (ref 1.7–7.7)
Neutrophils Relative %: 54 %
Platelets: 257 K/uL (ref 150–400)
RBC: 3.8 MIL/uL — ABNORMAL LOW (ref 4.22–5.81)
RDW: 14.4 % (ref 11.5–15.5)
WBC: 7.9 K/uL (ref 4.0–10.5)
nRBC: 0 % (ref 0.0–0.2)

## 2024-06-08 LAB — BASIC METABOLIC PANEL WITH GFR
Anion gap: 10 (ref 5–15)
BUN: 22 mg/dL (ref 8–23)
CO2: 16 mmol/L — ABNORMAL LOW (ref 22–32)
Calcium: 9.6 mg/dL (ref 8.9–10.3)
Chloride: 106 mmol/L (ref 98–111)
Creatinine, Ser: 1.24 mg/dL (ref 0.61–1.24)
GFR, Estimated: >60 mL/min (ref >60)
Glucose, Bld: 90 mg/dL (ref 70–99)
Potassium: 3.8 mmol/L (ref 3.5–5.1)
Sodium: 132 mmol/L — ABNORMAL LOW (ref 135–145)

## 2024-06-08 LAB — GLUCOSE, CAPILLARY
Glucose-Capillary: 96 mg/dL (ref 70–99)
Glucose-Capillary: 127 mg/dL — ABNORMAL HIGH (ref 70–99)
Glucose-Capillary: 99 mg/dL (ref 70–99)
Glucose-Capillary: 112 mg/dL — ABNORMAL HIGH (ref 70–99)

## 2024-06-08 MED ORDER — MELATONIN 3 MG PO TABS
3.0000 mg | ORAL_TABLET | Freq: Every day | ORAL | Status: DC
  Administered 2024-06-08 – 2024-06-18 (×11): 3 mg via ORAL
  Filled 2024-06-08 (×11): qty 1

## 2024-06-08 NOTE — Progress Notes (Signed)
 Physical Therapy Weekly Progress Note  Patient Details  Name: Kristopher Thompson MRN: [id number] Date of Birth: Sep 12, 1951  Beginning of progress report period: June 08, 2024 End of progress report period: June 08, 2024  Today's Date: 06/08/2024 PT Individual Time:  -      Patient has met 5 of 12 short term goals.  Patient has met short term goals.  Patient progressing toward long term goals..  Patient continues to demonstrate the following deficits muscle weakness and decreased cardiorespiratoy endurance and therefore will continue to benefit from skilled PT intervention to increase functional independence with mobility. Continue plan of care.  PT Short Term Goals Week 1:  PT Short Term Goal 1 (Week 1): = LTG Week 2:    Skilled Therapeutic Interventions/Progress Updates:    Pt in bed when PT arrived, agreeable for session.  PT donned RLE ACE bandaged, pt states wrapped too tightly and it caused him to fall into his w/c earlier. Slight leakage from one of his bags onto front and side of shorts. Wound RN notified.  Pt performed bed mobility supine->sit EOB with supervision with use of bed features, sit to stand with RW  supervision.  Pt performed car transfers with Min A for LE placement, RW placement into car.  Pt states his car has more leg room and will be easier to negotiate than the simulated car in gym.  Stair negotiation 6 steps with CGA x8 total with bilateral HHA on each railing for support, demonstrating step to pattern ascending and descending. Noted laboured breathing post activity.  Pt wheeled self out of gym x 45' in hallway with UE/intermittent LE forwards, backwards using LE's x110' back to room; supervision for both. Pt reports he is wiped out and requesting to get back into bed.  EOB to supine with min assist for LE placement, upper body placement.  PT instructed/educated pt on self bed mobility using HL for bridging, UE to position trunk.  Pt able to return demo CGA. All items  within reach, wife present.     Therapy Documentation Precautions:  Precautions Precautions: Fall Recall of Precautions/Restrictions: Intact Precaution/Restrictions Comments: Urostomy/Colostomy, Ab wound vac, B nephrostomy drains Restrictions Weight Bearing Restrictions Per Provider Order: No  Pain: Pain Assessment Pain Scale: 0-10 Pain Score: 3  Pain Location: Generalized Pain Intervention(s): Medication (See eMAR)    Therapy/Group: Individual Therapy  Kristopher Thompson Fast 06/08/2024, 8:18 AM

## 2024-06-08 NOTE — Progress Notes (Signed)
 PROGRESS NOTE   Subjective/Complaints:  Pt reports poor appetite, but even more is still nausea when smells food.  Just when eats.  Sides burning this AM- not due to therapy- therapy actually makes it better.  Was drinking 8 cups/day, but still  needed IVFs per pt/wife.     ROS:  Pt denies SOB, abd pain, CP, N/V/C/D, and vision changes Per HPI  Objective:   No results found. Recent Labs    06/08/24 0541  WBC 7.9  HGB 11.0*  HCT 33.3*  PLT 257    Recent Labs    06/06/24 0525 06/08/24 0541  NA 135 132*  K 4.4 3.8  CL 107 106  CO2 17* 16*  GLUCOSE 85 90  BUN 28* 22  CREATININE 1.34* 1.24  CALCIUM  10.0 9.6    Intake/Output Summary (Last 24 hours) at 06/08/2024 0833 Last data filed at 06/08/2024 9177 Gross per 24 hour  Intake 1552.94 ml  Output 2260 ml  Net -707.06 ml        Physical Exam: Vital Signs Blood pressure 111/73, pulse 98, temperature 97.8 F (36.6 C), temperature source Oral, resp. rate 16, height 5' 11 (1.803 m), weight 92.5 kg, SpO2 98%.       General: awake, alert, appropriate, sitting up slightly- wife at side; hooked up to IVFs from overnight; NAD HENT: conjugate gaze; oropharynx moist; nose opening clip on nose CV: regular rate and rhythm; no JVD Pulmonary: CTA B/L; no W/R/R- good air movement GI: soft, less TTP- no rebound, ND, (+)BS- drains in place as well as ostomies- 1/2 full this AM- appears like just emptied Psychiatric: appropriate Neurological: Ox3-very HOH this AM- not wearing hearing aids  Extremities: No significant LE edema Skin: Wound on abdomen- abdominal wound with pink granulation tissue, with small amount of loose slough at lower end of wound - both drains intact, one with about 10cc of greenish-yellow fluid, the other was essentially empty. Ostomy was intact with small amount of stool present.   Assessment/Plan: 1. Functional deficits which require 3+ hours  per day of interdisciplinary therapy in a comprehensive inpatient rehab setting. Physiatrist is providing close team supervision and 24 hour management of active medical problems listed below. Physiatrist and rehab team continue to assess barriers to discharge/monitor patient progress toward functional and medical goals  Care Tool:  Bathing    Body parts bathed by patient: Right arm, Left arm, Chest, Abdomen, Front perineal area, Right upper leg, Left upper leg, Face   Body parts bathed by helper: Buttocks, Right lower leg, Left lower leg     Bathing assist Assist Level: Minimal Assistance - Patient > 75%     Upper Body Dressing/Undressing Upper body dressing   What is the patient wearing?: Pull over shirt    Upper body assist Assist Level: Set up assist    Lower Body Dressing/Undressing Lower body dressing      What is the patient wearing?: Underwear/pull up     Lower body assist Assist for lower body dressing: Maximal Assistance - Patient 25 - 49%     Toileting Toileting Toileting Activity did not occur (Clothing management and hygiene only): N/A (no void or bm) (  Urostomy/Colostomy)  Toileting assist       Transfers Chair/bed transfer  Transfers assist     Chair/bed transfer assist level: Supervision/Verbal cueing     Locomotion Ambulation   Ambulation assist      Assist level: Contact Guard/Touching assist Assistive device: Walker-rolling Max distance: 80'   Walk 10 feet activity   Assist  Walk 10 feet activity did not occur: Safety/medical concerns (pt too weak, fatigue)  Assist level: Contact Guard/Touching assist Assistive device: Walker-rolling   Walk 50 feet activity   Assist Walk 50 feet with 2 turns activity did not occur: Safety/medical concerns (pt too weak, fatigue)  Assist level: Contact Guard/Touching assist Assistive device: Walker-rolling    Walk 150 feet activity   Assist Walk 150 feet activity did not occur:  Safety/medical concerns (pt too weak, fatigue)         Walk 10 feet on uneven surface  activity   Assist Walk 10 feet on uneven surfaces activity did not occur: Safety/medical concerns (pt too weak, fatigue)         Wheelchair     Assist Is the patient using a wheelchair?: Yes Type of Wheelchair: Manual    Wheelchair assist level: Supervision/Verbal cueing Max wheelchair distance: 73'    Wheelchair 50 feet with 2 turns activity    Assist    Wheelchair 50 feet with 2 turns activity did not occur:  (pt too weak, fatigue)   Assist Level: Total Assistance - Patient < 25%   Wheelchair 150 feet activity     Assist  Wheelchair 150 feet activity did not occur:  (pt too weak, fatigue)   Assist Level: Total Assistance - Patient < 25%   Blood pressure 111/73, pulse 98, temperature 97.8 F (36.6 C), temperature source Oral, resp. rate 16, height 5' 11 (1.803 m), weight 92.5 kg, SpO2 98%.  Medical Problem List and Plan: 1. Functional deficits secondary to debility 2/2 perforated bowel             -patient may not shower due to wound vac             -ELOS/Goals: 10-14 days S            D/c 06/19/24  Pt's OH is really limiting therapy as well as nausea Wife to get products ot help leakage from ostomies  -Con't CIR PT and OT 2.  Antithrombotics: -DVT/anticoagulation:  Mechanical:  Antiembolism stockings, knee (TED hose) Bilateral lower extremities             -antiplatelet therapy: continue Eliquis  3. Pain Management: continue Scheduled Tylenol ; Oxycodone  as needed took 30mg  Oxy IR yesterday , 20mg  thus far today    7/17- took tylenol  this AM- pain down to 2-3/10- will con't regimen and monitor if needs more with therapy  7/22- Will change tylenol  to 500 mg BID and con't oxy prn  7/23- hasn't required pain meds overnight- doing better  7/24-27- pain control improved  7/28- pain doing well except sides burning this AM- but better than usual 4. Mood/Behavior/Sleep:  LCSW to follow for evaluation and support when available.              -antipsychotic agents: N/A             -anxiety: continue Atarax  25 mg  5. Neuropsych/cognition: This patient is capable of making decisions on his own behalf.   6. Complicated wound: WOC nurse consultation             -  Continue wound VAC             Zosyn  d/ced as per surgery recommendation   7/26-7 hydrogel and gauze dressing in place. Midline abd wound looks great 7. 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   -doesn't have much appetite but is trying to eat -ate 100-100-25% yesterday 7/28- still nausea- no change with med changes-    8. AKI/CKD stage IIIb: Creatinine improving 2.3 now 1.91 -monitor BMP   7/17- Cr 2.05- on IVF's 125/cc/hour- will continue for now and recheck labs in AM             - On continuous IVF NS 125 mL/hr   7/18- Will reduce IVFs to 75cc/hour-     Latest Ref Rng & Units 06/08/2024    5:41 AM 06/06/2024    5:25 AM 06/04/2024    5:01 AM  BMP  Glucose 70 - 99 mg/dL 90  85  89   BUN 8 - 23 mg/dL 22  28  13    Creatinine 0.61 - 1.24 mg/dL 8.75  8.65  8.71   Sodium 135 - 145 mmol/L 132  135  133   Potassium 3.5 - 5.1 mmol/L 3.8  4.4  3.7   Chloride 98 - 111 mmol/L 106  107  108   CO2 22 - 32 mmol/L 16  17  15    Calcium  8.9 - 10.3 mg/dL 9.6  89.9  9.2     2/79- reduce IVF to 50cc/hr Recent ECHO showed normal Ej fx, no concerns or signs of fluid overload at this time   7/22- will stop IVF's- IV is leaking and pt wants to try off it.   7/23-  labs in AM  7/24- Cr up to 1.28- from 1.08- will recheck Monday- is up a little because pt not drinking as much, will push fluids still  7/27 BUN/Cr   back up yesterday   -continue IVF tonight.   7/28- BUN/Cr 22 and 1.24 9. DM type II: 04/01/24 Hemoglobin A1c 5.3  -Monitor BS AC/at bedtime and use SSI 7/17- will change to regular diet and try to have pt make good choices- since poor intake- CBG's low 7/18- BG's 84-125- well controlled- con't regimen- probably low due to poor intake.  CBG (last 3)  Recent Labs    06/07/24 1644 06/07/24 2119 06/08/24 0613  GLUCAP 101* 115* 96    7/27 tight control 10.  Severe Hypotension: Order orthostatic vitals              - teds ordered             -increase midodrine  to 10mg  TID   7/17- BP running 80s systolic and feels lightheaded- Midodrine  was increased yesterday to 10 mg TID- if doesn't improve today, will order ACE wraps as well as Thigh high TED's- cannot do abd binder due to abd drains, VAC and ostomies- if this doesn't  improve in next 24 hours, will add Florinef .  Also liberalized his diet to regular diet so can have some salt to help.   7/18- will add Florinef  0.1 mg daily- advised therapy to add ACE wraps- wasn't started yesterday- pt IS symptomatic- will also continue IVFs for now Vitals:   06/07/24 1950 06/08/24 0520  BP: 103/64 111/73  Pulse: 96 98  Resp: 18 16  Temp: 99 F (37.2 C) 97.8 F (36.6 C)  SpO2: 98% 98%  Overall improved since 7/16 but remains soft cont IVF but at lower rate   7/22- Stopped IVFs since IV is leaking- pt wants ot get rid of IVF's- pt needs to drink 6-8 cups of milk juice or water /day- if labs get worse on Thursday, will restart IVFs. BP's running low 100's systolic- better than before.   7/23- per pt, less [facility]  7/24- still having OH per therapy- will d/w PT/OT before changing meds  7/25- BP 70/s40s standing this AM with ACE wraps and TEDs to thighs- will increase Florinef  to 0.2 mg since has been 1 week since  started- will also have everyone push fluids  7/27 sitting and lying readings were almost equal yesterday 11. Stage I Obesity: BMI 31.49 kg/m  -educate on diet and weight loss to promote overall health and mobility.   7/17- BMI down to 28.44 7/22- BMI 28.44 7/28- will ask nursing to check weight 12. Hyperlipidemia: Continue Crestor    13. Screening for vitamin D  deficiency:check vitamin D  level tomorrow 14. Hyponatremia  7/17- Na down to 128- but is on IVF's- hard to fluid restrict due to Renal issues, so will keep on IVF's and recheck in AM  7/18- Na up to 134- will not intervene more  7/22- Na up to 136  7/26- Na 135 15. Poor appetite, nausea- frequent  7/18- will schedule low dose Compazine  before meals TID due to frequent nausea impacting his ability to eat meals. 7/22- still has severe loss of appetite, even though nausea is doing somewhat better- will Add Periacin 4 mg at bedtime- cannot add Remeron due to prolonged QT interval  7/23- will schedule Reglan  5 mg TID-AC to also help with nausea . 7/24- better appetite and nausea this AM- ate 2 bowls cheerios! Making gains 7/25- Will increase Compazine  to 10 mg before meals and changed toming of Reglan  to get 1 hour before meals as well   7/27--ate 100-100-25% yesterday   -he's trying!   -encouraged wife to bring in food from home/outside   -continue periactin   7/28- no improvement in nausea- will d/w team   I spent a total of 37   minutes on total care today- >50% coordination of care- due to  D/w team; review of labs, and vitals and output       LOS: 12 days A FACE TO FACE EVALUATION WAS PERFORMED  Audrionna Lampton 06/08/2024, 8:33 AM

## 2024-06-08 NOTE — Consult Note (Signed)
 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 Peristomal assessment: intact  Treatment options for stomal/peristomal skin: 2 ostomy barrier ring; WOC happed to have a few barrier extenders in my bag used on left lateral aspect of the tape border of the urostomy pouch  Output yellow urine Ostomy pouching: 1pc.convex with 2 barrier ring  Education provided: patient and wife have been independent with ostomy care for many years for this stoma.   Enrolled patient in [place] Secure Start Discharge program: Yes, previously   WOC Nurse will follow along with you for continued support with ostomy teaching and care Tycho Cheramie [facility] MSN, RN, [place], CNS, MAINE [phone]

## 2024-06-08 NOTE — Progress Notes (Signed)
 Physical Therapy Session Note  Patient Details  Name: Kristopher Thompson MRN: [id number] Date of Birth: [birth year]  Today's Date: 06/08/2024 PT Individual Time: 0800-0900 PT Individual Time Calculation (min): 60 min   Short Term Goals: Week 2:     Skilled Therapeutic Interventions/Progress Updates:    pt received in bed and agreeable to therapy. Pt reports some discomfort in his side, but reports this resolved once up and moving. Donned ted hose and ace wraps with tot a. Pt's wife assisted with draining nephrostomy and ileostomy bag, documented in flow sheet.   Bed mobility with min a for trunk elevation.  Pt reports this has felt more difficult past few days. Stand pivot transfer with CGA, but pt with LOB resulting in min a for uncontrolled sit to chair. Pt reports feels like L foot got stuck/heavy on floor. Later resolved feeling by adjusting ted hose.   Session focused on standing exercise in front of w/c to build pt confidence. Pt performed standing marches x 20 and Sit to stand + standing march combo 3 x 5. Pt reports L knee pain initially that resolved with further activity. Pt required CGA and cueing for full ROM.   Pt then ambulated x 73 ft with light min a for endurance and functional mobility. Pt fatigued at this time and requesting to sit. Demoes poor but adequate foot clearance and had difficulty with cues for upright posture. Pt remained in w/c and was left with all needs in reach and alarm active.   Therapy Documentation Precautions:  Precautions Precautions: Fall Recall of Precautions/Restrictions: Intact Precaution/Restrictions Comments: Urostomy/Colostomy, Ab wound vac, B nephrostomy drains Restrictions Weight Bearing Restrictions Per Provider Order: No General:       Therapy/Group: Individual Therapy  Schuyler JAYSON Batter 06/08/2024, 8:40 AM

## 2024-06-08 NOTE — Progress Notes (Signed)
 Occupational Therapy Session Note  Patient Details  Name: Kristopher Thompson MRN: [id number] Date of Birth: 06-23-1951  Today's Date: 06/08/2024 OT Individual Time: 0922-1003 OT Individual Time Calculation (min): 41 min    Short Term Goals: Week 2:  OT Short Term Goal 1 (Week 2): Pt will complete transfers at [facility] consistently with LRAD OT Short Term Goal 2 (Week 2): Pt will complete ADL activity in standing for >3 min with LRAD OT Short Term Goal 3 (Week 2): Pt will complete LB dressing at [facility] with AE as required  Skilled Therapeutic Interventions/Progress Updates:  Pt greeted seated in w/c, pt agreeable to OT intervention.      Transfers/bed mobility/functional mobility:  Pt completed all sit>stands with RW and MINA.   Therapeutic activity:  Initial BP from w/c 93/64( 73) HR 113 with teds and ace wraps donned.  Pt completed standing tolerance task with pt instructed to stand to match cards to vertical board with 2lb weight donned on RUE. Pt completed task with MIN A- CGA however pt initially reports dizziness upon first stand needing to sit BP- 111/72( 85) HR 124   BP assessed after able to complete standing tolerance task- 128/71( 86) HR 120  Exercises:  Pt completed of standing toe taps to 3.8 inch step with BUE support from RW with CGA for LB strengthening and endurance BP- 116/70( 84) HR 118    Pt completed below BUE therex with 3lb weight OH presses x10  Chest presses x10  X10 bicep curls Forward rows x10                  Ended session with pt supine in bed with all needs within reach and bed alarm activated.                    Therapy Documentation Precautions:  Precautions Precautions: Fall Recall of Precautions/Restrictions: Intact Precaution/Restrictions Comments: Urostomy/Colostomy, Ab wound vac, B nephrostomy drains Restrictions Weight Bearing Restrictions Per Provider Order: No    Pain: no pain     Therapy/Group: Individual Therapy  Ronal Gift  [facility] 06/08/2024, 12:08 PM

## 2024-06-08 NOTE — Progress Notes (Signed)
 Patient ID: Kristopher Thompson, male   DOB: [birth year], 73 y.o.   MRN: [id number]   Repeat drain Cr on 7/23 noted to be c/w serum. I will asking nursing to record his drain output in his chart and then could possible give consideration to repeat loopogram later this week pending drain output and possibly repeat drain Cr level.

## 2024-06-08 NOTE — Progress Notes (Signed)
 Occupational Therapy Session Note  Patient Details  Name: Kristopher Thompson MRN: [id number] Date of Birth: [birth year]  Today's Date: 06/08/2024 OT Individual Time: 1345-1505 OT Individual Time Calculation (min): 80 min    Short Term Goals: Week 1:  OT Short Term Goal 1 (Week 1): Pt will perform stand-step transfer with Min A + RW. OT Short Term Goal 1 - Progress (Week 1): Met OT Short Term Goal 2 (Week 1): Pt will thread LB garment with Min A + LRAD. OT Short Term Goal 2 - Progress (Week 1): Met OT Short Term Goal 3 (Week 1): Pt will tolerate >2 mins of standing activity for carryover in ADLs. OT Short Term Goal 3 - Progress (Week 1): Met  Skilled Therapeutic Interventions/Progress Updates:    Pt received in bed with wife present and RN completing training on ostomy care.  Pt then agreeable to sitting up to attend therapy.  Worked on rolling onto side with caution to move bags out of the way and able to push up to sit with min A.  Pt stood to prepare to transfer to w/c and noticed his pants were wet. Unsure where the wetness came from.  All drainage sites looked intact.  Pt doffed pants with min A with reacher and donned new pants with mod A with reacher. Able to sit to stand from EOB with CGA but would get very fatigued in standing.  Pt did not feel strong enough this session to ambulate bed to wc so instead used the RW to stand pivot.  Pt taken to gym to work on endurance exercises - arm bike for 12 min at resistance 1. Pt stated that resistance was difficult. In parallel bars, standing and alternating steps forward and back 10 x then needed rest break. Repeated exercise 4 sets.  Returned to room, used RW to step several steps to bed with CGA and moved to supine with A to lift legs.  Doffed TED hose and ACE wraps.  Pt resting in bed with all needs met.   Therapy Documentation Precautions:  Precautions Precautions: Fall Recall of Precautions/Restrictions:  Intact Precaution/Restrictions Comments: Urostomy/Colostomy, Ab wound vac, B nephrostomy drains Restrictions Weight Bearing Restrictions Per Provider Order: No    Vital Signs: Therapy Vitals Temp: 98 F (36.7 C) Temp Source: Oral Pulse Rate: 92 Resp: 18 BP: 100/73 Patient Position (if appropriate): Lying Oxygen Therapy SpO2: 98 % O2 Device: Room Air Pain:  No c/o pain     Therapy/Group: Individual Therapy  Micaela Stith 06/08/2024, 3:48 PM

## 2024-06-09 LAB — GLUCOSE, CAPILLARY
Glucose-Capillary: 108 mg/dL — ABNORMAL HIGH (ref 70–99)
Glucose-Capillary: 118 mg/dL — ABNORMAL HIGH (ref 70–99)
Glucose-Capillary: 127 mg/dL — ABNORMAL HIGH (ref 70–99)
Glucose-Capillary: 126 mg/dL — ABNORMAL HIGH (ref 70–99)

## 2024-06-09 MED ORDER — SODIUM CHLORIDE 0.45 % IV SOLN: INTRAVENOUS | Status: DC

## 2024-06-09 MED ORDER — SERTRALINE HCL 50 MG PO TABS
50.0000 mg | ORAL_TABLET | Freq: Every day | ORAL | Status: DC
  Administered 2024-06-09 – 2024-06-16 (×8): 50 mg via ORAL
  Filled 2024-06-09 (×9): qty 1

## 2024-06-09 MED ORDER — SODIUM CHLORIDE 0.9% FLUSH: 10.0000 mL | INTRAVENOUS | Status: DC | PRN

## 2024-06-09 NOTE — Progress Notes (Signed)
 Occupational Therapy Session Note  Patient Details  Name: Kristopher Thompson MRN: [id number] Date of Birth: [birth year]  Today's Date: 06/09/2024 OT Individual Time: 1345-1430 OT Individual Time Calculation (min): 45 min    Short Term Goals: Week 2:  OT Short Term Goal 1 (Week 2): Pt will complete transfers at [facility] consistently with LRAD OT Short Term Goal 2 (Week 2): Pt will complete ADL activity in standing for >3 min with LRAD OT Short Term Goal 3 (Week 2): Pt will complete LB dressing at [facility] with AE as required  Skilled Therapeutic Interventions/Progress Updates:      Therapy Documentation Precautions:  Precautions Precautions: Fall Recall of Precautions/Restrictions: Intact Precaution/Restrictions Comments: Urostomy/Colostomy, Ab wound vac, B nephrostomy drains Restrictions Weight Bearing Restrictions Per Provider Order: No General: Pt supine in bed upon OT arrival, agreeable to OT session. Pt with ACE wraps and TED hose on from prior session.  Pain: no pain reported  ADL: OT assisting with emptying urostomy bag d/t slight leakage, OT emptied 275 mL. OT providing skilled intervention in order to target functional endurance and activity tolerance. Pt OOB with SBA and able to ambulate with wife assisting with ambulation 131' without rest breaks from room>therapy gym. Pt then completed the following exercise circuit in order to improve functional activity, strength and endurance to prepare for ADLs such as bathing. Pt completed the following exercises in seated/standing position with no noted LOB/SOB and various repetitions on each exercise: -forward rows 3x1:00 -W/C propulsion with BLE -5s sit to stand with use of UE and 46 sec to complete   Pt then dependently transported back to room and pt getting back to bed with SBA and use of leg lifter, OT noting increased independence with use of leg lifter.    Pt supine in bed with bed alarm activated, 2 bed rails up, call light within  reach and 4Ps assessed.   Therapy/Group: Individual Therapy  Camie Hoe, OTD, OTR/L 06/09/2024, 4:15 PM

## 2024-06-09 NOTE — Progress Notes (Addendum)
 PROGRESS NOTE   Subjective/Complaints:  Pt admits hasn't been able to do 8 cups/day last 2-3 days- did ~ 6 cups- wife agrees.   Didn't get IVFs last night- nursing said order wasn't right- per chart, said IVFs for 2 hours- changed to 12 hours 8pm to 8am.   Nausea was a moderate amount better last night- ate 50+% of dinner yesterday, yogurt and orange for lunch and 2 bowls cereal yesterday- only 1.5 bowls this AM of Cheerios.     ROS:   Pt denies SOB, abd pain, CP, N/V/C/D, and vision changes  Per HPI  Objective:   No results found. Recent Labs    06/08/24 0541  WBC 7.9  HGB 11.0*  HCT 33.3*  PLT 257    Recent Labs    06/08/24 0541  NA 132*  K 3.8  CL 106  CO2 16*  GLUCOSE 90  BUN 22  CREATININE 1.24  CALCIUM  9.6    Intake/Output Summary (Last 24 hours) at 06/09/2024 0848 Last data filed at 06/09/2024 0700 Gross per 24 hour  Intake 931.14 ml  Output 1125 ml  Net -193.86 ml        Physical Exam: Vital Signs Blood pressure 122/80, pulse 99, temperature 98.4 F (36.9 C), temperature source Oral, resp. rate 18, height 5' 11 (1.803 m), weight 92.5 kg, SpO2 98%.        General: awake, alert, appropriate, sitting up slightly in bed;  NAD HENT: conjugate gaze; oropharynx dry- expander on nose CV: regular rate and rhythm; no JVD Pulmonary: CTA B/L; no W/R/R- good air movement GI: soft, NT, ND,Ostomies 1/4 full- but Bowel ostomy has a lot of air in bag- abd wound C/D/I-  Psychiatric: appropriate- joking a little Neurological: Ox3 Less HOH this AM- wearing hearing aids  Extremities: No significant LE edema Skin: Wound on abdomen- abdominal wound with pink granulation tissue, with small amount of loose slough at lower end of wound still with brownish edges- from brownish drainage of wound- more superficial wound.   Assessment/Plan: 1. Functional deficits which require 3+ hours per day of  interdisciplinary therapy in a comprehensive inpatient rehab setting. Physiatrist is providing close team supervision and 24 hour management of active medical problems listed below. Physiatrist and rehab team continue to assess barriers to discharge/monitor patient progress toward functional and medical goals  Care Tool:  Bathing    Body parts bathed by patient: Right arm, Left arm, Chest, Abdomen, Front perineal area, Right upper leg, Left upper leg, Face   Body parts bathed by helper: Buttocks, Right lower leg, Left lower leg     Bathing assist Assist Level: Minimal Assistance - Patient > 75%     Upper Body Dressing/Undressing Upper body dressing   What is the patient wearing?: Pull over shirt    Upper body assist Assist Level: Set up assist    Lower Body Dressing/Undressing Lower body dressing      What is the patient wearing?: Underwear/pull up, Pants     Lower body assist Assist for lower body dressing: Moderate Assistance - Patient 50 - 74%     Toileting Toileting Toileting Activity did not occur Press photographer and hygiene  only): N/A (no void or bm) (Urostomy/Colostomy)  Toileting assist       Transfers Chair/bed transfer  Transfers assist     Chair/bed transfer assist level: Supervision/Verbal cueing     Locomotion Ambulation   Ambulation assist      Assist level: Contact Guard/Touching assist Assistive device: Walker-rolling Max distance: 80'   Walk 10 feet activity   Assist  Walk 10 feet activity did not occur: Safety/medical concerns (pt too weak, fatigue)  Assist level: Contact Guard/Touching assist Assistive device: Walker-rolling   Walk 50 feet activity   Assist Walk 50 feet with 2 turns activity did not occur: Safety/medical concerns (pt too weak, fatigue)  Assist level: Contact Guard/Touching assist Assistive device: Walker-rolling    Walk 150 feet activity   Assist Walk 150 feet activity did not occur:  Safety/medical concerns (pt too weak, fatigue)         Walk 10 feet on uneven surface  activity   Assist Walk 10 feet on uneven surfaces activity did not occur: Safety/medical concerns (pt too weak, fatigue)         Wheelchair     Assist Is the patient using a wheelchair?: Yes Type of Wheelchair: Manual    Wheelchair assist level: Supervision/Verbal cueing Max wheelchair distance: 155'    Wheelchair 50 feet with 2 turns activity    Assist    Wheelchair 50 feet with 2 turns activity did not occur:  (pt too weak, fatigue)   Assist Level: Supervision/Verbal cueing   Wheelchair 150 feet activity     Assist  Wheelchair 150 feet activity did not occur:  (pt too weak, fatigue)   Assist Level: Supervision/Verbal cueing   Blood pressure 122/80, pulse 99, temperature 98.4 F (36.9 C), temperature source Oral, resp. rate 18, height 5' 11 (1.803 m), weight 92.5 kg, SpO2 98%.  Medical Problem List and Plan: 1. Functional deficits secondary to debility 2/2 perforated bowel             -patient may not shower due to wound vac             -ELOS/Goals: 10-14 days S            D/c 06/19/24  Pt's OH is really limiting therapy as well as nausea Wife to get products ot help leakage from ostomies  Con't CIR PT and OT  Team conference today to f/u on progress 2.  Antithrombotics: -DVT/anticoagulation:  Mechanical:  Antiembolism stockings, knee (TED hose) Bilateral lower extremities             -antiplatelet therapy: continue Eliquis  3. Pain Management: continue Scheduled Tylenol ; Oxycodone  as needed took 30mg  Oxy IR yesterday , 20mg  thus far today    7/17- took tylenol  this AM- pain down to 2-3/10- will con't regimen and monitor if needs more with therapy  7/22- Will change tylenol  to 500 mg BID and con't oxy prn  7/23- hasn't required pain meds overnight- doing better  7/24-27- pain control improved  7/28- pain doing well except sides burning this AM- but better than  usual  7/29- taking pain meds ~ q6 hours per last 24 hours 4. Mood/Behavior/Sleep: LCSW to follow for evaluation and support when available.              -antipsychotic agents: N/A             -anxiety: continue Atarax  25 mg   7/29-  5. Neuropsych/cognition: This patient is capable of making decisions on his own  behalf.   6. Complicated wound: WOC nurse consultation             - Continue wound VAC             Zosyn  d/ced as per surgery recommendation   7/26-7 hydrogel and gauze dressing in place. Midline abd wound looks great 7. Fluids/Electrolytes/Nutrition: Monitor I's & O's--Recheck monitor labs routinely CBC/CMP              - Bowel regimen for ileostomy: Continue FiberCon and loperamide  twice daily Goal is [phone] mL out fecal ileostomy a day              - Carb/cardiac diet -continue Juven/Prosource/Ensure supplements              -Nausea/vomiting:  seems to have improved- PRN orders Reglan  and Zofran  7/24- made Reglan  scheduled 5 mg TID-AC- and nausea is somewhat better this AM 7/26-27  nausea seems a bit better.   -doesn't have much appetite but is trying to eat -ate 100-100-25% yesterday 7/28- still nausea- no change with med changes-    8. AKI/CKD stage IIIb: Creatinine improving 2.3 now 1.91 -monitor BMP   7/17- Cr 2.05- on IVF's 125/cc/hour- will continue for now and recheck labs in AM             - On continuous IVF NS 125 mL/hr   7/18- Will reduce IVFs to 75cc/hour-     Latest Ref Rng & Units 06/08/2024    5:41 AM 06/06/2024    5:25 AM 06/04/2024    5:01 AM  BMP  Glucose 70 - 99 mg/dL 90  85  89   BUN 8 - 23 mg/dL 22  28  13    Creatinine 0.61 - 1.24 mg/dL 8.75  8.65  8.71   Sodium 135 - 145 mmol/L 132  135  133   Potassium 3.5 - 5.1 mmol/L 3.8  4.4  3.7   Chloride 98 - 111 mmol/L 106  107  108   CO2 22 - 32 mmol/L 16  17  15    Calcium  8.9 - 10.3 mg/dL 9.6  89.9  9.2     2/79- reduce IVF to 50cc/hr Recent ECHO showed normal Ej fx, no concerns or signs of fluid  overload at this time   7/22- will stop IVF's- IV is leaking and pt wants to try off it.   7/23- labs in AM  7/24- Cr up to 1.28- from 1.08- will recheck Monday- is up a little because pt not drinking as much, will push fluids still  7/27 BUN/Cr   back up yesterday   -continue IVF tonight.   7/28- BUN/Cr 22 and 1.24 9. DM type II: 04/01/24 Hemoglobin A1c 5.3  -Monitor BS AC/at bedtime and use SSI 7/17- will change to regular diet and try to have pt make good choices- since poor intake- CBG's low 7/18- BG's 84-125- well controlled- con't regimen- probably low due to poor intake.  CBG (last 3)  Recent Labs    06/08/24 1651 06/08/24 2107 06/09/24 0616  GLUCAP 99 112* 108*    7/27 tight control 10.  Severe Hypotension: Order orthostatic vitals              - teds ordered             -increase midodrine  to 10mg  TID   7/17- BP running 80s systolic and feels lightheaded- Midodrine  was increased yesterday to 10 mg TID- if doesn't improve today, will order  ACE wraps as well as Thigh high TED's- cannot do abd binder due to abd drains, VAC and ostomies- if this doesn't improve in next 24 hours, will add Florinef .  Also liberalized his diet to regular diet so can have some salt to help.   7/18- will add Florinef  0.1 mg daily- advised therapy to add ACE wraps- wasn't started yesterday- pt IS symptomatic- will also continue IVFs for now Vitals:   06/08/24 2028 06/09/24 0541  BP: 108/67 122/80  Pulse: 91 99  Resp: 18 18  Temp: 98.8 F (37.1 C) 98.4 F (36.9 C)  SpO2: 96% 98%  Overall improved since 7/16 but remains soft cont IVF but at lower rate   7/22- Stopped IVFs since IV is leaking- pt wants ot get rid of IVF's- pt needs to drink 6-8 cups of milk juice or water /day- if labs get worse on Thursday, will restart IVFs. BP's running low 100's systolic- better than before.   7/23- per pt, less [facility]  7/24- still having OH per therapy- will d/w PT/OT before changing meds  7/25- BP 70/s40s standing  this AM with ACE wraps and TEDs to thighs- will increase Florinef  to 0.2 mg since has been 1 week since started- will also have everyone push fluids  7/27 sitting and lying readings were almost equal yesterday 11. Stage I Obesity: BMI 31.49 kg/m  -educate on diet and weight loss to promote overall health and mobility.   7/17- BMI down to 28.44 7/22- BMI 28.44 7/28- will ask nursing to check weight 12. Hyperlipidemia: Continue Crestor    13. Screening for vitamin D  deficiency:check vitamin D  level tomorrow 14. Hyponatremia  7/17- Na down to 128- but is on IVF's- hard to fluid restrict due to Renal issues, so will keep on IVF's and recheck in AM  7/18- Na up to 134- will not intervene more  7/22- Na up to 136  7/26- Na 135 15. Poor appetite, nausea- frequent  7/18- will schedule low dose Compazine  before meals TID due to frequent nausea impacting his ability to eat meals. 7/22- still has severe loss of appetite, even though nausea is doing somewhat better- will Add Periacin 4 mg at bedtime- cannot add Remeron due to prolonged QT interval  7/23- will schedule Reglan  5 mg TID-AC to also help with nausea . 7/24- better appetite and nausea this AM- ate 2 bowls cheerios! Making gains 7/25- Will increase Compazine  to 10 mg before meals and changed toming of Reglan  to get 1 hour before meals as well   7/27--ate 100-100-25% yesterday   -he's trying!   -encouraged wife to bring in food from home/outside   -continue periactin   7/28- no improvement in nausea- will d/w team  7/29- spoke with pharmacy- cannot add Remeron- due to prolonged QT interval and already on reglan -     I spent a total of  56  minutes on total care today- >50% coordination of care- due to   [facility] pharmacy about Nausea and how to treat- also and thinking Paxil might be appropriate for pt- but can cause nausea 17-20%- will look more into this- also have team conference today to f/u on progress     Addendum- will add Zoloft -  cannot add Celexa due to QT interval- 50 mg daily- to help with nausea.    LOS: 13 days A FACE TO FACE EVALUATION WAS PERFORMED  Olubunmi Rothenberger 06/09/2024, 8:48 AM

## 2024-06-09 NOTE — Progress Notes (Signed)
 Occupational Therapy Session Note  Patient Details  Name: Kristopher Thompson MRN: [id number] Date of Birth: 1951-06-21  Today's Date: 06/09/2024 OT Individual Time: 8881-8796 OT Individual Time Calculation (min): 45 min    Short Term Goals: Week 2:  OT Short Term Goal 1 (Week 2): Pt will complete transfers at [facility] consistently with LRAD OT Short Term Goal 2 (Week 2): Pt will complete ADL activity in standing for >3 min with LRAD OT Short Term Goal 3 (Week 2): Pt will complete LB dressing at [facility] with AE as required  Skilled Therapeutic Interventions/Progress Updates:  Patient agreeable to participate in OT session. Reports no pain level.   Patient participated in skilled OT session focusing on LB dressing, colostomy bag hygiene, grooming, and UB bathing. Patient completed bed mobility mod to max A, min A transfer to wc, set up UB bathing, LB dressing mod A for pants pull up in standing. Colostomy hygiene max A. Therapist facilitated increased mobility to increase activity tolerance. Educated on Programmer, applications.   Therapy Documentation Precautions:  Precautions Precautions: Fall Recall of Precautions/Restrictions: Intact Precaution/Restrictions Comments: Urostomy/Colostomy, Ab wound vac, B nephrostomy drains Restrictions Weight Bearing Restrictions Per Provider Order: No   Therapy/Group: Individual Therapy  D'mariea L Tenita Cue 06/09/2024, 7:56 AM

## 2024-06-09 NOTE — Progress Notes (Signed)
 Patient ID: Kristopher Thompson, male   DOB: Nov 29, 1950, 73 y.o.   MRN: [id number]  Met with pt and wife who is present to give team conference update regarding progress in therapies and discharge still planned for 8/8.  Tried to push him to continue to push himself in therapies and wife states:  I'm letting him do what he can with all that he has been through.  Pt feels did more today with therapists. Discussed education with nursing for dressing changes, and drains, along with therapy education. Wife has been participating with staff, according to her. Await equipment needs and working on home health follow.

## 2024-06-09 NOTE — Patient Care Conference (Signed)
 Inpatient RehabilitationTeam Conference and Plan of Care Update Date: 06/10/2024   Time: 1157 am    Patient Name: Kristopher Thompson      Medical Record Number: [id number]  Date of Birth: 01-31-1951 Sex: Male         Room/Bed: 4W23C/4W23C-01 Payor Info: Payor: MEDICARE / Plan: MEDICARE PART A AND B / Product Type: *No Product type* /    Admit Date/Time:  05/27/2024  3:04 PM  Primary Diagnosis:  Debility  Hospital Problems: Principal Problem:   Debility Active Problems:   S/P ileal conduit (HCC)   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 08/08/2017   GERD (gastroesophageal reflux disease)   Obesity (BMI 35.0-39.9 without comorbidity)   Irritant contact dermatitis associated with fecal stoma   Chronic anticoagulation   History of bladder cancer   Obstructive sleep apnea of adult   Personal history of PE (pulmonary embolism)   Delayed bowel perforation s/p SBR/end ileostomy   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 Duwaine Barrs Social Worker Present: Rhoda Clement, LCSW Nurse Present: Eulalio Falls, RN PT Present: Other (comment) Lessie Fast, PT) OT Present: Camie Hoe, OT SLP Present: Recardo Mole, SLP PPS Coordinator present : Eleanor Colon, SLP     Current Status/Progress Goal Weekly Team Focus  Bowel/Bladder      Urostomy, nephrostomy tubes, ileostomy tubes    Manage all tubes     Assess tubes q shift  Swallow/Nutrition/ Hydration               ADL's   SBA UB ADLs, Mod A LB ADLs with AE as necessary, Min A sponge bathing, CGA stand pivot transfers   SBA overall   inreasce confidence in mobility, re-iterating energy conservation  techniques, increase endurance/activity tolerance    Mobility   Supevision for transfers, bed mobility min/mod assist for LE placement, gait CGA/SBA; continues to fatigue ~x45'   supervision for gait with RW, mod indep for transfers  bed mobility transfers, strengthening, endurance tx, gait tx    Communication                Safety/Cognition/ Behavioral Observations               Pain      No pain    <4 w/ prns    Assess pain q shift  Skin      Mid incision- wet to dry dressing q shift MASD buttocks- gerharts   Skin remain free of infection    Assess skin q shift    Discharge Planning:  Home with wife who will be here to learn care and will order needed equipment and wound vac, working on [facility] needs and finding agency to accept   Team Discussion: Patient was admitted post debility due to perforated bowels. Patient with bilateral nephrostomy tubes, ileostomy and nephrostomy tubes. Patient with pain/ soft bp/ hyponatremia/ hypotension: medication adjusted by MD. Patient limited by poor appetite/ activity tolerance/ endurance and self limiting behaviors.   Patient on target to meet rehab goals: yes, patient requires supervision assistance with upper body care and mod assistance with lower body care. Patient requires CGA stand and pivot with transfers. Patient  able to ambulate hand held assist using a rolling walker.  *See Care Plan and progress notes for long and short-term goals.   Revisions to Treatment Plan:  Encourage increase in fluid intake   Teaching Needs: Safety, medications, incision care, train wife in management of tubes, toielting, transfers, etc   Current Barriers to Discharge: Decreased caregiver support and Home enviroment access/layout  Possible Resolutions to Barriers: Family Education     Medical Summary Current Status: depression- very flat; wet to dry dressings on abd- might do a loopgram for urostomy-  had an uncontroled descent into chair-  plopped actually; has MASD< but stage II is healed  Barriers to Discharge: Electrolyte abnormality;Hypotension;Inadequate Nutritional Intake;Complicated Wound;Behavior/Mood;Medical stability;Self-care education;Renal Insufficiency/Failure;Weight bearing restrictions;Other (comments)  Barriers to Discharge Comments: pt self limiting- doesn't want to participate and wants wife to do everything- very limiting; ostomies bowel and bladder- wounds on abdomen- wet to dry- VAC off; needs to drink so much tro compensate for ostomy;  is making some progress, but slow; Possible Resolutions to [employer] Focus: going home with ileostomy; urostomy; and wound care; drains;  needs education- still has nausea- on reglan , Compazine  scheduled-  d/c  8/8   Continued Need for Acute Rehabilitation Level of Care: The patient requires daily medical management by a physician with specialized training in physical medicine and rehabilitation for the following reasons: Direction of a multidisciplinary physical rehabilitation program to maximize functional independence : Yes Medical management of patient stability for increased activity during participation in an intensive rehabilitation regime.: Yes Analysis of laboratory values and/or radiology reports with any subsequent need for medication adjustment and/or medical intervention. : Yes   I attest that I was present, lead the team conference, and concur with the assessment and plan of the team.   Lane Kjos Gayo 06/09/2024, 1157 am

## 2024-06-09 NOTE — Progress Notes (Signed)
 Physical Therapy Session Note  Patient Details  Name: Kristopher Thompson MRN: [id number] Date of Birth: February 11, 1951  Today's Date: 06/09/2024 PT Individual Time: 8447-8365 PT Individual Time Calculation (min): 42 min   Short Term Goals: Week 2:     Skilled Therapeutic Interventions/Progress Updates:     Pt received semi reclined in bed and agrees to therapy. No complaint of pain. Pt performs supine to sit with bed features and cues for sequencing and positioning. Pt requires significantly increased time to complete and eventually requires minA to facilitate shifting weight and Lt hip toward edge of bed. Pt dons shoes with setup assistance. Pt performs sit to stand with CGA and cues for anterior weight shifting and hand placement. Stand step to [facility] with cues for positioning. PT provides transportation to dayroom with pt cued to hold knees in extension for isometric endurance training. Pt stands from [facility] with CGA and cues for initiation. Pt ambulates x130' with RW and cues to maintain upright gaze to improve posture and balance, as well as increasing stride length to improve gait pattern. Extended seated rest break and then pt ambulates additional x35'. WC transport back to room. Stand step to bed with cues for positioning. Pt requires modA management of BLEs for return to supine. Left with alarm intact and all needs within reach.   Therapy Documentation Precautions:  Precautions Precautions: Fall Recall of Precautions/Restrictions: Intact Precaution/Restrictions Comments: Urostomy/Colostomy, Ab wound vac, B nephrostomy drains Restrictions Weight Bearing Restrictions Per Provider Order: No   Therapy/Group: Individual Therapy  Elsie JAYSON Dawn, PT, DPT 06/09/2024, 4:11 PM

## 2024-06-09 NOTE — Progress Notes (Signed)
 Physical Therapy Session Note  Patient Details  Name: Kristopher Thompson MRN: [id number] Date of Birth: Jul 12, 1951  Today's Date: 06/09/2024 PT Individual Time: 0915-1030 PT Individual Time Calculation (min): 75 min   Short Term Goals: Week 1:  PT Short Term Goal 1 (Week 1): = LTG Week 2:     Skilled Therapeutic Interventions/Progress Updates:    Pt in bed when PT arrived.  Pt agreeable for session.  PT donned TED/ACE with total depend.  Pt supine->sit supervision to EOB. Sit->stand RW supervision.  Pt ambulated from room to day room with encouragement.  Pt requires x2 rest break in w/c. ~x285', CGA with wife pushing w/c behind. Pt sat up on mat with height elevated and lowered after each trial of sit to stand. Pt performed sit to stand without RW assist x10 reps cueing for trunk extension. Standing side stepping x5 reps with 2xHHA with rest after each rep. Standing with step touch forward x1 each leg. Pt able to sit without trunk support using Ue's.  2.2 weighted med ball reaching x10 for trunk extension, side to side for trunk rotation/obliques x10. Seated scap rows with red tb 2x10 emphasis on scap retraction for posture re-ed. Pt wheeled back to room, encouraged to stay in w/c, however, pt too fatigued and requested back in bed.  Pt transferred to EOB supervision, assist needed for LE placement.  Once in bed, pt able to position with bridging. Pt elevated HOB, all items within reach, wife present.    Therapy Documentation Precautions:  Precautions Precautions: Fall Recall of Precautions/Restrictions: Intact Precaution/Restrictions Comments: Urostomy/Colostomy, Ab wound vac, B nephrostomy drains Restrictions Weight Bearing Restrictions Per Provider Order: No  Pain: Burning sensation to right lateral flank (incisions)       Therapy/Group: Individual Therapy  [name] Fast 06/09/2024, 12:39 PM

## 2024-06-10 DIAGNOSIS — R5381 Other malaise: Secondary | ICD-10-CM | POA: Diagnosis not present

## 2024-06-10 DIAGNOSIS — K631 Perforation of intestine (nontraumatic): Secondary | ICD-10-CM | POA: Diagnosis not present

## 2024-06-10 LAB — GLUCOSE, CAPILLARY
Glucose-Capillary: 95 mg/dL (ref 70–99)
Glucose-Capillary: 139 mg/dL — ABNORMAL HIGH (ref 70–99)
Glucose-Capillary: 113 mg/dL — ABNORMAL HIGH (ref 70–99)
Glucose-Capillary: 119 mg/dL — ABNORMAL HIGH (ref 70–99)

## 2024-06-10 NOTE — Plan of Care (Signed)
 Pt's urostomy with cloudy urine OP. Dressing changed for the right gravity drain, erythema a noted along with small 0.5cm diameter circular blister noted around the site.   Problem: Consults Goal: RH GENERAL PATIENT EDUCATION Description: See Patient Education module for education specifics. Outcome: Progressing   Problem: RH SKIN INTEGRITY Goal: RH STG SKIN FREE OF INFECTION/BREAKDOWN Description: Manage skin free of infection/breakdown with minimal assistance Outcome: Progressing   Problem: RH SAFETY Goal: RH STG ADHERE TO SAFETY PRECAUTIONS W/ASSISTANCE/DEVICE Description: STG Adhere to Safety Precautions With minimal assistance Assistance/Device. Outcome: Progressing   Problem: RH PAIN MANAGEMENT Goal: RH STG PAIN MANAGED AT OR BELOW PT'S PAIN GOAL Description: <4 w/ prns Outcome: Progressing

## 2024-06-10 NOTE — Progress Notes (Signed)
 PROGRESS NOTE   Subjective/Complaints:  Pt reports  nausea worse this AM/last night- for some reason.  Entire day not just in afternoon.  Was dry heavng yesterday.   Abd pain is doing better ROS:    Pt denies SOB, abd pain, CP, N/V/C/D, and vision changes    Per HPI  Objective:   No results found. Recent Labs    06/08/24 0541  WBC 7.9  HGB 11.0*  HCT 33.3*  PLT 257    Recent Labs    06/08/24 0541  NA 132*  K 3.8  CL 106  CO2 16*  GLUCOSE 90  BUN 22  CREATININE 1.24  CALCIUM  9.6    Intake/Output Summary (Last 24 hours) at 06/10/2024 1044 Last data filed at 06/10/2024 0804 Gross per 24 hour  Intake 886.58 ml  Output 1880 ml  Net -993.42 ml        Physical Exam: Vital Signs Blood pressure 120/74, pulse 98, temperature 98.7 F (37.1 C), temperature source Oral, resp. rate 18, height 5' 11 (1.803 m), weight 92.5 kg, SpO2 98%.         General: awake, alert, appropriate, sitting up in bed; wife at bedside; a little stilted this AM  NAD HENT: conjugate gaze; oropharynx moist CV: regular rate and rhythm- rate in 90's; no JVD Pulmonary: CTA B/L; no W/R/R- good air movement GI: soft, ND, (+)BS- less TTP- ostomies all empty right now Psychiatric: appropriate Neurological: Ox3  Extremities: No significant LE edema Skin: Wound on abdomen- abdominal wound with pink granulation tissue, with small amount of loose slough at lower end of wound still with brownish edges- from brownish drainage of wound- more superficial wound.   Assessment/Plan: 1. Functional deficits which require 3+ hours per day of interdisciplinary therapy in a comprehensive inpatient rehab setting. Physiatrist is providing close team supervision and 24 hour management of active medical problems listed below. Physiatrist and rehab team continue to assess barriers to discharge/monitor patient progress toward functional and medical  goals  Care Tool:  Bathing    Body parts bathed by patient: Right arm, Left arm, Chest, Abdomen, Front perineal area, Right upper leg, Left upper leg, Face   Body parts bathed by helper: Buttocks, Right lower leg, Left lower leg     Bathing assist Assist Level: Minimal Assistance - Patient > 75%     Upper Body Dressing/Undressing Upper body dressing   What is the patient wearing?: Pull over shirt    Upper body assist Assist Level: Set up assist    Lower Body Dressing/Undressing Lower body dressing      What is the patient wearing?: Underwear/pull up, Pants     Lower body assist Assist for lower body dressing: Moderate Assistance - Patient 50 - 74%     Toileting Toileting Toileting Activity did not occur (Clothing management and hygiene only): N/A (no void or bm) (Urostomy/Colostomy)  Toileting assist       Transfers Chair/bed transfer  Transfers assist     Chair/bed transfer assist level: Supervision/Verbal cueing     Locomotion Ambulation   Ambulation assist      Assist level: Contact Guard/Touching assist Assistive device: Walker-rolling Max distance: 66'  Walk 10 feet activity   Assist  Walk 10 feet activity did not occur: Safety/medical concerns (pt too weak, fatigue)  Assist level: Supervision/Verbal cueing Assistive device: Walker-rolling   Walk 50 feet activity   Assist Walk 50 feet with 2 turns activity did not occur: Safety/medical concerns (pt too weak, fatigue)  Assist level: Contact Guard/Touching assist Assistive device: Walker-rolling    Walk 150 feet activity   Assist Walk 150 feet activity did not occur: Safety/medical concerns (pt too weak, fatigue)  Assist level: Contact Guard/Touching assist Assistive device: Walker-rolling    Walk 10 feet on uneven surface  activity   Assist Walk 10 feet on uneven surfaces activity did not occur: Safety/medical concerns (pt too weak, fatigue)          Wheelchair     Assist Is the patient using a wheelchair?: Yes Type of Wheelchair: Manual    Wheelchair assist level: Supervision/Verbal cueing Max wheelchair distance: 155'    Wheelchair 50 feet with 2 turns activity    Assist    Wheelchair 50 feet with 2 turns activity did not occur:  (pt too weak, fatigue)   Assist Level: Supervision/Verbal cueing   Wheelchair 150 feet activity     Assist  Wheelchair 150 feet activity did not occur:  (pt too weak, fatigue)   Assist Level: Supervision/Verbal cueing   Blood pressure 120/74, pulse 98, temperature 98.7 F (37.1 C), temperature source Oral, resp. rate 18, height 5' 11 (1.803 m), weight 92.5 kg, SpO2 98%.  Medical Problem List and Plan: 1. Functional deficits secondary to debility 2/2 perforated bowel             -patient may shower              -ELOS/Goals: 10-14 days S            D/c 06/19/24  Pt's OH is really limiting therapy as well as nausea Wife to get products ot help leakage from ostomies  Con't CIR PT, OT- TEDs and ACE wraps 2.  Antithrombotics: -DVT/anticoagulation:  Mechanical:  Antiembolism stockings, knee (TED hose) Bilateral lower extremities             -antiplatelet therapy: continue Eliquis  3. Pain Management: continue Scheduled Tylenol ; Oxycodone  as needed took 30mg  Oxy IR yesterday , 20mg  thus far today    7/17- took tylenol  this AM- pain down to 2-3/10- will con't regimen and monitor if needs more with therapy  7/22- Will change tylenol  to 500 mg BID and con't oxy prn  7/23- hasn't required pain meds overnight- doing better  7/24-27- pain control improved  7/28- pain doing well except sides burning this AM- but better than usual  7/29-7/30- taking pain meds ~ q6 hours per last 24 hours 4. Mood/Behavior/Sleep: LCSW to follow for evaluation and support when available.              -antipsychotic agents: N/A             -anxiety: continue Atarax  25 mg   7/29-  5. Neuropsych/cognition: This  patient is capable of making decisions on his own behalf.   6. Complicated wound: WOC nurse consultation             - Continue wound VAC             Zosyn  d/ced as per surgery recommendation   7/26-7 hydrogel and gauze dressing in place. Midline abd wound looks great 7. 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   -doesn't have much appetite but is trying to eat -ate 100-100-25% yesterday 7/28- still nausea- no change with med changes-    8. AKI/CKD stage IIIb: Creatinine improving 2.3 now 1.91 -monitor BMP   7/17- Cr 2.05- on IVF's 125/cc/hour- will continue for now and recheck labs in AM             - On continuous IVF NS 125 mL/hr   7/18- Will reduce IVFs to 75cc/hour-     Latest Ref Rng & Units 06/08/2024    5:41 AM 06/06/2024    5:25 AM 06/04/2024    5:01 AM  BMP  Glucose 70 - 99 mg/dL 90  85  89   BUN 8 - 23 mg/dL 22  28  13    Creatinine 0.61 - 1.24 mg/dL 8.75  8.65  8.71   Sodium 135 - 145 mmol/L 132  135  133   Potassium 3.5 - 5.1 mmol/L 3.8  4.4  3.7   Chloride 98 - 111 mmol/L 106  107  108   CO2 22 - 32 mmol/L 16  17  15    Calcium  8.9 - 10.3 mg/dL 9.6  89.9  9.2     2/79- reduce IVF to 50cc/hr Recent ECHO showed normal Ej fx, no concerns or signs of fluid overload at this time   7/22- will stop IVF's- IV is leaking and pt wants to try off it.   7/23- labs in AM  7/24- Cr up to 1.28- from 1.08- will recheck Monday- is up a little because pt not drinking as much, will push fluids still  7/27 BUN/Cr   back up  yesterday   -continue IVF tonight.   7/28- BUN/Cr 22 and 1.24 9. DM type II: 04/01/24 Hemoglobin A1c 5.3  -Monitor BS AC/at bedtime and use SSI 7/17- will change to regular diet and try to have pt make good choices- since poor intake- CBG's low 7/18- BG's 84-125- well controlled- con't regimen- probably low due to poor intake.  CBG (last 3)  Recent Labs    06/09/24 1715 06/09/24 2122 06/10/24 0603  GLUCAP 127* 126* 95    7/27 tight control 7/30- great control- con't regimen 10.  Severe Hypotension: Order orthostatic vitals              - teds ordered             -increase midodrine  to 10mg  TID   7/17- BP running 80s systolic and feels lightheaded- Midodrine  was increased yesterday to 10 mg TID- if doesn't improve today, will order ACE wraps as well as Thigh high TED's- cannot do abd binder due to abd drains, VAC and ostomies- if this doesn't improve in next 24 hours, will add Florinef .  Also liberalized his diet to regular diet so can have some salt to help.   7/18- will add Florinef  0.1 mg daily- advised therapy to add ACE wraps- wasn't  started yesterday- pt IS symptomatic- will also continue IVFs for now Vitals:   06/09/24 2013 06/10/24 0601  BP: 114/74 120/74  Pulse: (!) 104 98  Resp: 18 18  Temp: 98.6 F (37 C) 98.7 F (37.1 C)  SpO2: 99% 98%  Overall improved since 7/16 but remains soft cont IVF but at lower rate   7/22- Stopped IVFs since IV is leaking- pt wants ot get rid of IVF's- pt needs to drink 6-8 cups of milk juice or water /day- if labs get worse on Thursday, will restart IVFs. BP's running low 100's systolic- better than before.   7/23- per pt, less [facility]  7/24- still having OH per therapy- will d/w PT/OT before changing meds  7/25- BP 70/s40s standing this AM with ACE wraps and TEDs to thighs- will increase Florinef  to 0.2 mg since has been 1 week since started- will also have everyone push fluids  7/27 sitting and lying readings were almost equal yesterday  7/30- pt's  BP dropped to 90s/60s this AM- so will con't TEDs and ACE wraps 11. Stage I Obesity: BMI 31.49 kg/m  -educate on diet and weight loss to promote overall health and mobility.   7/17- BMI down to 28.44 7/22- BMI 28.44 7/28- will ask nursing to check weight 12. Hyperlipidemia: Continue Crestor    13. Screening for vitamin D  deficiency:check vitamin D  level tomorrow 14. Hyponatremia  7/17- Na down to 128- but is on IVF's- hard to fluid restrict due to Renal issues, so will keep on IVF's and recheck in AM  7/18- Na up to 134- will not intervene more  7/22- Na up to 136  7/26- Na 135 15. Poor appetite, nausea- frequent  7/18- will schedule low dose Compazine  before meals TID due to frequent nausea impacting his ability to eat meals. 7/22- still has severe loss of appetite, even though nausea is doing somewhat better- will Add Periacin 4 mg at bedtime- cannot add Remeron due to prolonged QT interval  7/23- will schedule Reglan  5 mg TID-AC to also help with nausea . 7/24- better appetite and nausea this AM- ate 2 bowls cheerios! Making gains 7/25- Will increase Compazine  to 10 mg before meals and changed toming of Reglan  to get 1 hour before meals as well   7/27--ate 100-100-25% yesterday   -he's trying!   -encouraged wife to bring in food from home/outside   -continue periactin   7/28- no improvement in nausea- will d/w team  7/29- spoke with pharmacy- cannot add Remeron- due to prolonged QT interval and already on reglan -   7/30- added Zoloft  yesterday- will monitor   I spent a total of 51   minutes on total care today- >50% coordination of care- due to  D/w wife about his self limiting behavior- and feels like he cannot do things that he ends up doing.   LOS: 14 days A FACE TO FACE EVALUATION WAS PERFORMED  Brinly Maietta 06/10/2024, 10:44 AM

## 2024-06-10 NOTE — Progress Notes (Incomplete)
 Physical Therapy Weekly Progress Note  Patient Details  Name: Kristopher Thompson MRN: [id number] Date of Birth: [birth year]  Beginning of progress report period: June 10, 2024 End of progress report period: June 10, 2024  Today's Date: 06/10/2024 PT Individual Time: 830-945; 75 minutes PT Individual Time: 1345-1500; 90 minutes     Patient has met  6 of 12 short term goals.  Pt is making slow, but steady gains towards goals. Pt has improved with overall gait assistance (CGA/S) and distance (x285' total), stair negotiation, car transfers.   Patient continues to demonstrate the following deficits muscle weakness and muscle joint tightness and decreased cardiorespiratoy endurance and therefore will continue to benefit from skilled PT intervention to increase functional independence with mobility.  Patient progressing toward long term goals..  Continue plan of care.  PT Short Term Goals Week 1:  PT Short Term Goal 1 (Week 1): = LTG Week 2:     Skilled Therapeutic Interventions/Progress Updates:    Pt in bed when PT arrived, pt agreeable for session.  Pt denies pain, slept well.  Supine->sit tranfers with Min A for trunk support, educated on proper bed positioning, body mechanics to conserve energy.  BP supine 123/75, seated EOB 119/76, standing 94/40.  Sitting at EOB, PT donned TED/ACE on bilat LE with Max A.  Pt ambulated to day gym with RW CGA/Supervision with x1 rest 1/3 way.  Seated on mat, pt performed ex's with 2# ankle wts: standing heel raises x10, step touch (forward) x5, rest, standing marching x5, forward leg kicks x5, step outs x5, hip ext x5, rest, marching x30, rest. Marching x30, rest, seated step outs x10, LAQ 2x5, heel raises x30.  Pt ambulated back to room with RW keeping ankle wts donned.  Pt has difficulty keeping head up in sitting/standing; requires cueing to correct.  Discussed discharge planning with HHPT,goals, home safety, DME, posture re-ed, importance of sitting up in  w/c for overall endurance, continuing fluid intake, pt agreed. Wife reports pt has rollator at home, will ad another railing to enter via garage x1-2 steps.   Session 2: Pt in bed finishing up lunch (yogurt), pt agreeable for PT.  Pt's wife removed ACE bandaged stating, Velcro was pressing into skin.  PT reapplied with total assist. Pt denies dizziness/pain at this time.  Pt ambulated to hallway from bed with RW SBA, wheeled to gym to conserve energy.  Pt negotiated x4 steps up/down with x2 railing, CGA. PT noticed shorts were saturated, and further examination showed ostomy bag was falling off.  PT returned pt back to room, notified Nsg.  Pt transferred to bed with supervision, using leg lifts pt able to transfer (with extended time) LE's onto bed to supine with supervision. Pt performed supine LE ex's: bridging 3x10, hip abd/add 3x10, H/L clams/KTC with green TB above knees 3x10, leg presses with green TB 2x15.  Wound nurse entered during session to change pt's ostomy. Pt  required changing clothes; both shirt and shorts.  Pt able to demonstrate good EOB sitting trunk control to doff/donn shirt with set up, pt indep with doff/donn shorts using reacher at EOB, pt then stood with FWW to pull shorts up with min assist. Pt able to demonstrate good indep standing balance to tie shorts.  Pt required x2-3 rest breaks during process and noticeably fatigued. EOB->w/c with RW supervision.  Pt wheeled to main gym, performed stair negotiation 6 and 3 steps with x2 railings, CGA demonstrating step to pattern ascending, reciprocal pattern descending,  no LOB, good motor planning.  Pt very fatigued post activity.  Pt returned to room with wife present, all items within reach. PT discussed with patient and wife importance of nutrition, especially protein intake.   Therapy Documentation Precautions:  Precautions Precautions: Fall Recall of Precautions/Restrictions: Intact Precaution/Restrictions Comments:  Urostomy/Colostomy, Ab wound vac, B nephrostomy drains Restrictions Weight Bearing Restrictions Per Provider Order: No    Pain: No pain     Therapy/Group: Individual Therapy  [name] Fast 06/10/2024, 12:41 PM

## 2024-06-10 NOTE — Consult Note (Signed)
 WOC Nurse ostomy follow up   WOC team received a request for assistance from bedside RN for leaking urostomy.  Therapy at bedside at time of visit, report believe that patients pants may have contributed to urostomy appliance coming loose due to pulling.  New pouch applied.   Thank you,  Doyal Polite, RN, MSN, [facility] WOC Team

## 2024-06-10 NOTE — Progress Notes (Signed)
 Occupational Therapy Weekly Progress Note  Patient Details  Name: Kristopher Thompson MRN: [id number] Date of Birth: 1951/01/10  Beginning of progress report period: June 03, 2024 End of progress report period: June 10, 2024  Today's Date: 06/10/2024 OT Individual Time: 8964-8879 OT Individual Time Calculation (min): 45 min    Patient has met 3 of 3 short term goals.  Pt with steady increase on ADL independence with AE as necessary and increased general activity tolerance. Wife actively participating in OT sessions and has assisted with transfers. Pt endurance/activity tolerance continuing to be a barrier, although steadily increasing while also applying energy conservation techniques.  Patient continues to demonstrate the following deficits: muscle weakness, decreased cardiorespiratoy endurance, and decreased standing balance, decreased postural control, and decreased balance strategies and therefore will continue to benefit from skilled OT intervention to enhance overall performance with BADL and Reduce care partner burden.  Patient progressing toward long term goals..  Continue plan of care.  OT Short Term Goals Week 1:  OT Short Term Goal 1 (Week 1): Pt will perform stand-step transfer with Min A + RW. OT Short Term Goal 1 - Progress (Week 1): Met OT Short Term Goal 2 (Week 1): Pt will thread LB garment with Min A + LRAD. OT Short Term Goal 2 - Progress (Week 1): Met OT Short Term Goal 3 (Week 1): Pt will tolerate >2 mins of standing activity for carryover in ADLs. OT Short Term Goal 3 - Progress (Week 1): Met Week 2:  OT Short Term Goal 1 (Week 2): Pt will complete transfers at [facility] consistently with LRAD OT Short Term Goal 1 - Progress (Week 2): Met OT Short Term Goal 2 (Week 2): Pt will complete ADL activity in standing for >3 min with LRAD OT Short Term Goal 2 - Progress (Week 2): Met OT Short Term Goal 3 (Week 2): Pt will complete LB dressing at CGA with AE as required OT Short  Term Goal 3 - Progress (Week 2): Met Week 3:  OT Short Term Goal 1 (Week 3): STG=LTG d/t ELOS  Skilled Therapeutic Interventions/Progress Updates:      Therapy Documentation Precautions:  Precautions Precautions: Fall Recall of Precautions/Restrictions: Intact Precaution/Restrictions Comments: Urostomy/Colostomy, Ab wound vac, B nephrostomy drains Restrictions Weight Bearing Restrictions Per Provider Order: No General: Pt seated in W/C upon OT arrival, agreeable to OT.  Pain: no pain reported  Balance: Pt ambulated throughout therapy gym with RW at SBA level to complete scavenger hunt to retrieve items. Pt retrieved 8 with/without VC, 2 items required VC to locate. Pt completed activity in order to increase activity tolerance and endurance.   Exercises: Pt completed 10 minutes of nu step bike in order to increase BLE strength and endurance in preparation for increased independence in ADLs. Pt instructed to complete with only BLE for increased strength/endurance in preparation for functional mobility. Rest break after 5 min, on level 4 resistance.   Pt supine in bed with bed alarm activated, 2 bed rails up, call light within reach and 4Ps assessed.   Therapy/Group: Individual Therapy  Camie Hoe, OTD, OTR/L 06/10/2024, 12:39 PM

## 2024-06-11 DIAGNOSIS — R5381 Other malaise: Secondary | ICD-10-CM | POA: Diagnosis not present

## 2024-06-11 LAB — CREATININE, FLUID (PLEURAL, PERITONEAL, JP DRAINAGE): Creat, Fluid: 1.3 mg/dL

## 2024-06-11 LAB — URINALYSIS, ROUTINE W REFLEX MICROSCOPIC
Bilirubin Urine: NEGATIVE
Glucose, UA: NEGATIVE mg/dL
Ketones, ur: NEGATIVE mg/dL
Nitrite: NEGATIVE
Protein, ur: 100 mg/dL — AB
Specific Gravity, Urine: 1.009 (ref 1.005–1.030)
WBC, UA: >50 WBC/hpf (ref 0–5)
pH: 5 (ref 5.0–8.0)

## 2024-06-11 LAB — CBC WITH DIFFERENTIAL/PLATELET
Abs Immature Granulocytes: 0.14 K/uL — ABNORMAL HIGH (ref 0.00–0.07)
Basophils Absolute: 0.1 K/uL (ref 0.0–0.1)
Basophils Relative: 2 %
Eosinophils Absolute: 0.9 K/uL — ABNORMAL HIGH (ref 0.0–0.5)
Eosinophils Relative: 12 %
HCT: 34 % — ABNORMAL LOW (ref 39.0–52.0)
Hemoglobin: 11.1 g/dL — ABNORMAL LOW (ref 13.0–17.0)
Immature Granulocytes: 2 %
Lymphocytes Relative: 20 %
Lymphs Abs: 1.5 K/uL (ref 0.7–4.0)
MCH: 28.4 pg (ref 26.0–34.0)
MCHC: 32.6 g/dL (ref 30.0–36.0)
MCV: 87 fL (ref 80.0–100.0)
Monocytes Absolute: 1.1 K/uL — ABNORMAL HIGH (ref 0.1–1.0)
Monocytes Relative: 15 %
Neutro Abs: 3.8 K/uL (ref 1.7–7.7)
Neutrophils Relative %: 49 %
Platelets: 306 K/uL (ref 150–400)
RBC: 3.91 MIL/uL — ABNORMAL LOW (ref 4.22–5.81)
RDW: 14.3 % (ref 11.5–15.5)
WBC: 7.5 K/uL (ref 4.0–10.5)
nRBC: 0 % (ref 0.0–0.2)

## 2024-06-11 LAB — GLUCOSE, CAPILLARY
Glucose-Capillary: 98 mg/dL (ref 70–99)
Glucose-Capillary: 125 mg/dL — ABNORMAL HIGH (ref 70–99)
Glucose-Capillary: 124 mg/dL — ABNORMAL HIGH (ref 70–99)
Glucose-Capillary: 118 mg/dL — ABNORMAL HIGH (ref 70–99)

## 2024-06-11 LAB — BASIC METABOLIC PANEL WITH GFR
Anion gap: 10 (ref 5–15)
BUN: 23 mg/dL (ref 8–23)
CO2: 16 mmol/L — ABNORMAL LOW (ref 22–32)
Calcium: 9.8 mg/dL (ref 8.9–10.3)
Chloride: 105 mmol/L (ref 98–111)
Creatinine, Ser: 1.36 mg/dL — ABNORMAL HIGH (ref 0.61–1.24)
GFR, Estimated: 55 mL/min — ABNORMAL LOW (ref >60)
Glucose, Bld: 94 mg/dL (ref 70–99)
Potassium: 4.1 mmol/L (ref 3.5–5.1)
Sodium: 131 mmol/L — ABNORMAL LOW (ref 135–145)

## 2024-06-11 LAB — PREALBUMIN: Prealbumin: 26 mg/dL (ref 18–38)

## 2024-06-11 LAB — ALBUMIN: Albumin: 2.5 g/dL — ABNORMAL LOW (ref 3.5–5.0)

## 2024-06-11 MED ORDER — MIDODRINE HCL 5 MG PO TABS
15.0000 mg | ORAL_TABLET | Freq: Three times a day (TID) | ORAL | Status: DC
  Administered 2024-06-11 – 2024-06-19 (×22): 15 mg via ORAL
  Filled 2024-06-11 (×21): qty 3

## 2024-06-11 NOTE — Progress Notes (Signed)
 Physical Therapy Session Note  Patient Details  Name: Kristopher Thompson MRN: [id number] Date of Birth: September 12, 1951  Today's Date: 06/11/2024 PT Individual Time: 0745-0900 PT Individual Time Calculation (min): 75 min  PT Individual Time: 8959-8884 PT Individual Time Calculation (min): 35 min PT Individual Time: 1300-1345 PT Individual Time Calculation (min): 45 min  Short Term Goals: Week 1:  PT Short Term Goal 1 (Week 1): = LTG Week 2:     Skilled Therapeutic Interventions/Progress Updates:    Session 1:Pt in bed when PT arrived, agreeable for PT session this morning. PT donned TED/ACE bandages with max assist.  Pt reports he slept well, no pain, just feeling weak this morning.  Supine to EOB with HOB elevated and use of railings mod assist for trunk support; pt attempted x3 before PT assist. Pt mobility is slow due to generalized weakness. Pt did not requires LE assist today.   EOB->RW supervision with bed at standard chair height.  Pt requesting to raise, PT denied and explained he should demonstrate real life environmental situations.  Pt able to stand with supervision x2 attempts to RW.  Stand to w/c with supervision.  Pt wheeled to day gym keeping LE's off floor.  NuStep x15 minutes level 1, seat 13, UE 11. Standing with RW 3 step touch x3 trials x20 each with rest in between  Cueing needed to keep head/eyes at horizon.  Pt unable to sustain.  PT encouraged pt to sit up in w/c more. Pt up in w/c with all items within reach  Session 2: Pt up in w/c and reports he was in too long and his bottom hurts, wraps too tight.  PT loosened up ACE bandages.  Pt agreeable for PT.  Pt able to ambulate to gym with x1 rest break.  Seated on mat for extended rest. BP seated 132/87; standing  96/64.  Pt requesting to be wheeled back into bed. Pt transferred into bed with min assist for LE placement, all items within reach.   Session 3: Pt in bed finished lunch.  Pt's requesting ostomy bag emptied, wife  able to perform task. Pt ambulated to day gym with RW, x1 rest break. Rest on mat. Session focused on standing balance, NMR, coordination, strength.  Pt performed standing corn hole ball toss in standing with RW x3 trials (until pt made 5 in the hole); resting in between trials. No LOB, good coordination and noted improved standing tolerance. Pt presents with supervision for sit<->stand transfers using RW. Pt too fatigued to walk back and wheeled to room, pt transferred into bed supervision from w/c->RW->bed->supine using leg lifts, bed railing x2, PT adjusted bed for pt comfort, all items within reach, wife at bedside.    Therapy Documentatio Precautions:  Precautions Precautions: Fall Recall of Precautions/Restrictions: Intact Precaution/Restrictions Comments: Urostomy/Colostomy, Ab wound vac, B nephrostomy drains Restrictions Weight Bearing Restrictions Per Provider Order: No  Pain: 0/10      Therapy/Group: Individual Therapy  [name] Fast 06/11/2024, 7:50 AM

## 2024-06-11 NOTE — Progress Notes (Signed)
 Pt having orthostatic hypotension during OT session this afternoon. Pt was symptomatic sitting/standing with teds/ace wraps applied. Once repositioned back into bed pt symptoms resolved. MD made aware.

## 2024-06-11 NOTE — Progress Notes (Signed)
 Occupational Therapy Session Note  Patient Details  Name: Kristopher Thompson MRN: [id number] Date of Birth: Jul 13, 1951  Today's Date: 06/11/2024 OT Individual Time: 8552-8467 OT Individual Time Calculation (min): 45 min    Short Term Goals: Week 3:  OT Short Term Goal 1 (Week 3): STG=LTG d/t ELOS  Skilled Therapeutic Interventions/Progress Updates:   Patient agreeable to participate in OT session. Reports low pain level, premedicated.   Patient participated in skilled OT session focusing on orthostatics, upright tolerance, and UE strengthening. Patient able to complete supine to sit with rails with min to mod A. Patient required increased time with each position change due to dizziness. Patient BP dropped substantially during stand with RN and OT. Patient able to return to sitting and recovery with rest. Patient completed UE strengthening in all planes 2x15 to increase ability to complete Adls. Completed bed mobility sit to supine supine to sit with min A.  Therapy Documentation Precautions:  Precautions Precautions: Fall Recall of Precautions/Restrictions: Intact Precaution/Restrictions Comments: Urostomy/Colostomy, Ab wound vac, B nephrostomy drains Restrictions Weight Bearing Restrictions Per Provider Order: No   Therapy/Group: Individual Therapy  Kristopher Thompson 06/11/2024, 3:49 PM

## 2024-06-11 NOTE — Progress Notes (Addendum)
 PROGRESS NOTE   Subjective/Complaints:  Pt reports nausea maybe a tiny bit better- per wife, occurs more in evening than in AM.  Feels a little weaker than normal this AM- frustrated how hard it is to get OOB-  slept well Not in mood for cake for B day  Per PT- did stairs CGA yesterday and  doing a lot more with therapies.   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    ROS:    Pt denies SOB, abd pain, CP, N/V/C/D, and vision changes     Per HPI  Objective:   No results found. Recent Labs    06/11/24 0417  WBC 7.5  HGB 11.1*  HCT 34.0*  PLT 306    Recent Labs    06/11/24 0417  NA 131*  K 4.1  CL 105  CO2 16*  GLUCOSE 94  BUN 23  CREATININE 1.36*  CALCIUM  9.8    Intake/Output Summary (Last 24 hours) at 06/11/2024 0828 Last data filed at 06/11/2024 0531 Gross per 24 hour  Intake 1165.32 ml  Output 640 ml  Net 525.32 ml        Physical Exam: Vital Signs Blood pressure 116/71, pulse (!) 103, temperature 98.2 F (36.8 C), resp. rate 19, height 5' 11 (1.803 m), weight 92.5 kg, SpO2 97%.         General: awake, alert, appropriate, sitting EOB with PT and wife in room;  NAD HENT: conjugate gaze; oropharynx dry- still wearing nose opener CV: regular to borderline tachycardic rate, regular rhythm; no JVD Pulmonary: CTA B/L; no W/R/R- good air movement GI: soft, NT, ND, (+)BS- ostomies ~ 1/2 full this AM-  Psychiatric: appropriate Neurological: Ox3   Extremities: No significant LE edema- still attached to IVFs-  Skin: Wound on abdomen- abdominal wound with pink granulation tissue, with small amount of loose slough at lower end of wound still with brownish edges- from brownish drainage of wound- more superficial wound.   Assessment/Plan: 1. Functional deficits which require 3+ hours per  day of interdisciplinary therapy in a comprehensive inpatient rehab setting. Physiatrist is providing close team supervision and 24 hour management of active medical problems listed below. Physiatrist and rehab team continue to assess barriers to discharge/monitor patient progress toward functional and medical goals  Care Tool:  Bathing    Body parts bathed by patient: Right arm, Left arm, Chest, Abdomen, Front perineal area, Right upper leg, Left upper leg, Face, Buttocks, Left lower leg, Right lower leg   Body parts bathed by helper: Buttocks, Right lower leg, Left lower leg     Bathing assist Assist Level: Contact Guard/Touching assist     Upper Body Dressing/Undressing Upper body dressing   What is the patient wearing?: Pull over shirt    Upper body assist Assist Level: Set up assist    Lower Body Dressing/Undressing Lower body dressing      What is the patient wearing?: Pants     Lower body assist Assist for lower body dressing: Contact Guard/Touching assist     Toileting Toileting Toileting Activity did  not occur Press photographer and hygiene only): N/A (no void or bm) (urostomy/ilieostomy)  Toileting assist       Transfers Chair/bed transfer  Transfers assist     Chair/bed transfer assist level: Supervision/Verbal cueing     Locomotion Ambulation   Ambulation assist      Assist level: Contact Guard/Touching assist Assistive device: Walker-rolling Max distance: x5   Walk 10 feet activity   Assist  Walk 10 feet activity did not occur: Safety/medical concerns (pt too weak, fatigue)  Assist level: Supervision/Verbal cueing Assistive device: Walker-rolling   Walk 50 feet activity   Assist Walk 50 feet with 2 turns activity did not occur: Safety/medical concerns (pt too weak, fatigue)  Assist level: Contact Guard/Touching assist Assistive device: Walker-rolling    Walk 150 feet activity   Assist Walk 150 feet activity did not occur:  Safety/medical concerns (pt too weak, fatigue)  Assist level: Contact Guard/Touching assist Assistive device: Walker-rolling    Walk 10 feet on uneven surface  activity   Assist Walk 10 feet on uneven surfaces activity did not occur: Safety/medical concerns (pt too weak, fatigue)         Wheelchair     Assist Is the patient using a wheelchair?: Yes Type of Wheelchair: Manual    Wheelchair assist level: Supervision/Verbal cueing Max wheelchair distance: 155'    Wheelchair 50 feet with 2 turns activity    Assist    Wheelchair 50 feet with 2 turns activity did not occur:  (pt too weak, fatigue)   Assist Level: Supervision/Verbal cueing   Wheelchair 150 feet activity     Assist  Wheelchair 150 feet activity did not occur:  (pt too weak, fatigue)   Assist Level: Supervision/Verbal cueing   Blood pressure 116/71, pulse (!) 103, temperature 98.2 F (36.8 C), resp. rate 19, height 5' 11 (1.803 m), weight 92.5 kg, SpO2 97%.  Medical Problem List and Plan: 1. Functional deficits secondary to debility 2/2 perforated bowel             -patient may shower              -ELOS/Goals: 10-14 days S            D/c 06/19/24  Pt's OH is really limiting therapy as well as nausea Wife to get products ot help leakage from ostomies  Con't CIR PT, OT- no dizziness with ACE wraps/TEDs- cannot stop them yet. Is improving Sx's of OH with Increase in Florinef  2.  Antithrombotics: -DVT/anticoagulation:  Mechanical:  Antiembolism stockings, knee (TED hose) Bilateral lower extremities             -antiplatelet therapy: continue Eliquis  3. Pain Management: continue Scheduled Tylenol ; Oxycodone  as needed took 30mg  Oxy IR yesterday , 20mg  thus far today    7/17- took tylenol  this AM- pain down to 2-3/10- will con't regimen and monitor if needs more with therapy  7/22- Will change tylenol  to 500 mg BID and con't oxy prn  7/23- hasn't required pain meds overnight- doing better  7/24-27-  pain control improved  7/28- pain doing well except sides burning this AM- but better than usual  7/29-7/30- taking pain meds ~ q6 hours per last 24 hours 4. Mood/Behavior/Sleep: LCSW to follow for evaluation and support when available.              -antipsychotic agents: N/A             -anxiety: continue Atarax  25 mg   7/29-  5. Neuropsych/cognition: This patient is capable of making decisions on his own behalf.   6. Complicated wound: WOC nurse consultation             - Continue wound VAC             Zosyn  d/ced as per surgery recommendation   7/26-7 hydrogel and gauze dressing in place. Midline abd wound looks great 7. Fluids/Electrolytes/Nutrition: Monitor I's & O's--Recheck monitor labs routinely CBC/CMP              - Bowel regimen for ileostomy: Continue FiberCon and loperamide  twice daily Goal is [phone] mL out fecal ileostomy a day              - Carb/cardiac diet -continue Juven/Prosource/Ensure supplements              -Nausea/vomiting:  seems to have improved- PRN orders Reglan  and Zofran  7/24- made Reglan  scheduled 5 mg TID-AC- and nausea is somewhat better this AM 7/26-27  nausea seems a bit better.   -doesn't have much appetite but is trying to eat -ate 100-100-25% yesterday 7/28- still nausea- no change with med changes-   7/31- isn't eating well- Albumin  down to 2.5 from 3.3 2 weeks ago.  He's on Prosource 2x/day and supplements 2x/day with max protein- will d/w pt if drinking them 8. AKI/CKD stage IIIb: Creatinine improving 2.3 now 1.91 -monitor BMP   7/17- Cr 2.05- on IVF's 125/cc/hour- will continue for now and recheck labs in AM             - On continuous IVF NS 125 mL/hr   7/18- Will reduce IVFs to 75cc/hour-     Latest Ref Rng & Units 06/11/2024    4:17 AM 06/08/2024    5:41 AM 06/06/2024    5:25 AM  BMP  Glucose 70 - 99 mg/dL 94  90  85   BUN 8 - 23 mg/dL 23  22  28    Creatinine 0.61 - 1.24 mg/dL 8.63  8.75  8.65   Sodium 135 - 145 mmol/L 131  132  135    Potassium 3.5 - 5.1 mmol/L 4.1  3.8  4.4   Chloride 98 - 111 mmol/L 105  106  107   CO2 22 - 32 mmol/L 16  16  17    Calcium  8.9 - 10.3 mg/dL 9.8  9.6  89.9     2/79- reduce IVF to 50cc/hr Recent ECHO showed normal Ej fx, no concerns or signs of fluid overload at this time   7/22- will stop IVF's- IV is leaking and pt wants to try off it.   7/23- labs in AM  7/24- Cr up to 1.28- from 1.08- will recheck Monday- is up a little because pt not drinking as much, will push fluids still  7/27 BUN/Cr   back up yesterday   -continue IVF tonight.   7/28- BUN/Cr 22 and 1.24  7/31- Pt's BUN is down to 23; but Cr up slightly to 1.36- in spite of IVFs- I'm concerned will go home and get dehydrated- we discussed NEEDS to drink more than 8 cups/day.  9. DM type II: 04/01/24 Hemoglobin A1c 5.3  -Monitor BS AC/at bedtime and use SSI 7/17- will change to regular diet and try to have pt make good choices- since poor intake- CBG's low 7/18- BG's 84-125- well controlled- con't regimen- probably low due to poor intake.  CBG (last 3)  Recent Labs    06/10/24 1641 06/10/24 2111  06/11/24 0610  GLUCAP 113* 119* 98    7/27 tight control 7/30-7/31 great control- con't regimen- doesn't want cake or anything for bday today 10.  Severe Hypotension: Order orthostatic vitals              - teds ordered             -increase midodrine  to 10mg  TID   7/17- BP running 80s systolic and feels lightheaded- Midodrine  was increased yesterday to 10 mg TID- if doesn't improve today, will order ACE wraps as well as Thigh high TED's- cannot do abd binder due to abd drains, VAC and ostomies- if this doesn't improve in next 24 hours, will add Florinef .  Also liberalized his diet to regular diet so can have some salt to help.   7/18- will add Florinef  0.1 mg daily- advised therapy to add ACE wraps- wasn't started yesterday- pt IS symptomatic- will also continue IVFs for now Vitals:   06/10/24 2004 06/11/24 0612  BP: 110/68 116/71   Pulse: 100 (!) 103  Resp: 19 19  Temp: 98.2 F (36.8 C) 98.2 F (36.8 C)  SpO2: 97% 97%  Overall improved since 7/16 but remains soft cont IVF but at lower rate   7/22- Stopped IVFs since IV is leaking- pt wants ot get rid of IVF's- pt needs to drink 6-8 cups of milk juice or water /day- if labs get worse on Thursday, will restart IVFs. BP's running low 100's systolic- better than before.   7/23- per pt, less [facility]  7/24- still having OH per therapy- will d/w PT/OT before changing meds  7/25- BP 70/s40s standing this AM with ACE wraps and TEDs to thighs- will increase Florinef  to 0.2 mg since has been 1 week since started- will also have everyone push fluids  7/27 sitting and lying readings were almost equal yesterday  7/30- pt's BP dropped to 90s/60s this AM- so will con't TEDs and ACE wraps  7/31- Not dizzy with sitting EOB this AM- does feel a little weak- asked PT to f/u on BP's 11. Stage I Obesity: BMI 31.49 kg/m  -educate on diet and weight loss to promote overall health and mobility.   7/17- BMI down to 28.44 7/22- BMI 28.44 7/28- will ask nursing to check weight 7/31- will ask nursing to add Daily weights- concerned he's losing weight when doesn't need to right now 12. Hyperlipidemia: Continue Crestor    13. Screening for vitamin D  deficiency:check vitamin D  level tomorrow 14. Hyponatremia  7/17- Na down to 128- but is on IVF's- hard to fluid restrict due to Renal issues, so will keep on IVF's and recheck in AM  7/18- Na up to 134- will not intervene more  7/22- Na up to 136  7/26- Na 135  7/31- Na 131 15. Poor appetite, nausea- frequent  7/18- will schedule low dose Compazine  before meals TID due to frequent nausea impacting his ability to eat meals. 7/22- still has severe loss of appetite, even though nausea is doing somewhat better- will Add Periacin 4 mg at bedtime- cannot add Remeron due to prolonged QT interval  7/23- will schedule Reglan  5 mg TID-AC to also help with  nausea . 7/24- better appetite and nausea this AM- ate 2 bowls cheerios! Making gains 7/25- Will increase Compazine  to 10 mg before meals and changed toming of Reglan  to get 1 hour before meals as well   7/27--ate 100-100-25% yesterday   -he's trying!   -encouraged wife to bring in food from  home/outside   -continue periactin   7/28- no improvement in nausea- will d/w team  7/29- spoke with pharmacy- cannot add Remeron- due to prolonged QT interval and already on reglan -   7/30- added Zoloft  yesterday- will monitor  7/31- nausea worse in evening with dinner- Albumin  is dropping- on A lot of supplements- not sure he's taking them- will double with wife.    I spent a total of 53   minutes on total care today- >50% coordination of care- due to  D/w PT and wife about pt- also with patient- reviewed labs and vitals- asked for daily weights/might need to increase Periactin  if weight still decreasing. Spoke to nurse multiple times due to Severe OH and possible UTI  Addendum- will increase Midodrine  to 15 mg TID since BP standing was 76 systolic just now at 3pm- He's already on Florinef  0.2 mg- and still a problem Also his Urostomy is cloudy, milky- will check U/A and Cx.     LOS: 15 days A FACE TO FACE EVALUATION WAS PERFORMED  Delonda Coley 06/11/2024, 8:28 AM

## 2024-06-12 ENCOUNTER — Inpatient Hospital Stay (HOSPITAL_COMMUNITY)

## 2024-06-12 DIAGNOSIS — R5381 Other malaise: Secondary | ICD-10-CM | POA: Diagnosis not present

## 2024-06-12 DIAGNOSIS — K631 Perforation of intestine (nontraumatic): Secondary | ICD-10-CM | POA: Diagnosis not present

## 2024-06-12 LAB — GLUCOSE, CAPILLARY
Glucose-Capillary: 103 mg/dL — ABNORMAL HIGH (ref 70–99)
Glucose-Capillary: 138 mg/dL — ABNORMAL HIGH (ref 70–99)
Glucose-Capillary: 109 mg/dL — ABNORMAL HIGH (ref 70–99)
Glucose-Capillary: 115 mg/dL — ABNORMAL HIGH (ref 70–99)

## 2024-06-12 MED ORDER — CEPHALEXIN 250 MG PO CAPS
500.0000 mg | ORAL_CAPSULE | Freq: Two times a day (BID) | ORAL | Status: DC
  Administered 2024-06-12 – 2024-06-15 (×6): 500 mg via ORAL
  Filled 2024-06-12 (×6): qty 2

## 2024-06-12 NOTE — Progress Notes (Signed)
 Occupational Therapy Session Note  Patient Details  Name: Kristopher Thompson MRN: [id number] Date of Birth: [birth year]  Today's Date: 06/12/2024 OT Individual Time: 1120-1145 OT Individual Time Calculation (min): 25 min  and Today's Date: 06/12/2024 OT Missed Time: 15 Minutes Missed Time Reason: Other (comment) (Elevated HR/SOB)  Today's Date: 06/12/2024 OT Individual Time: 8694-8662 OT Individual Time Calculation (min): 32 min   Short Term Goals: Week 3:  OT Short Term Goal 1 (Week 3): STG=LTG d/t ELOS  Skilled Therapeutic Interventions/Progress Updates:   Session 1:  Pt greeted sitting in WC, reports of un-rated pain at nephrostomy tube sites, rest provided as needed. Pt verbalizes that his BP . . . Was terrible this morning. OT dedicates time to assess vitals prior to initiating therapeutic activities. BP=114/82 & HR=115-116, not trending down despite extended seated rest-break. Consulting civil engineer and NT updated. NT re-assessing vitals during session. Pt completes x1 STS with BP dropping to 94/57 and HR rising to 140 bpm. Medical team made aware. Pt returns to supine with A for BLE elevation, sit<>stands with sup/CGA. Pt remained resting in bed with all immediate needs met, missing ~15 mins of skilled intervention due to the above symptomology.   Session 2:  Pt greeted resting in bed, no orders to hold therapy due to the above, session progressed with caution and attention to vital signs. Pt's HR at 104 in supine, transition to EOB completed with use of bed features. BP=104/75 & HR=122, post-bed mobility, return to HR=117 with extended rest break. Pt completes x2 bouts of functional mobility, ~10 ft each. BP=120/76 & HR=132, post first bout with similar readings noted post second bout. Pt able to ambulate a total of ~12ft back to EOB, upon which vitals noted as BP=124/81 & HR=135. Rest provided throughout with lowest HR=118. Pt returns to supine with A provided for BLE elevation. Pt remained resting  in bed with all immediate needs met.   Therapy Documentation Precautions:  Precautions Precautions: Fall Recall of Precautions/Restrictions: Intact Precaution/Restrictions Comments: Urostomy/Colostomy, Ab wound vac, B nephrostomy drains Restrictions Weight Bearing Restrictions Per Provider Order: No   Therapy/Group: Individual Therapy  [name], OTR/L, MSOT  06/12/2024, 5:23 AM

## 2024-06-12 NOTE — Progress Notes (Signed)
 Pt refused CHG bath and orthostatic BP despite encouragement.

## 2024-06-12 NOTE — Progress Notes (Signed)
 Physical Therapy Session Note  Patient Details  Name: Kristopher Thompson MRN: [id number] Date of Birth: [birth year]  Today's Date: 06/12/2024 PT Individual Time: 1000-1100 PT Individual Time Calculation (min): 60 min   Short Term Goals: Week 1:  PT Short Term Goal 1 (Week 1): = LTG Week 2:     Skilled Therapeutic Interventions/Progress Updates:    Pt in bed with HOB elevated, pt agreeable for PT.  Pt transferred to EOB with supervision using railing required moderate amount of time due to generalized weakness.  BP seated 109/77; standing 82/58.  Pt sat @ EOB x4', standing BP 91/62.  Pt c/o dizziness on initial standing, resolved shortly after. Pt wheeled to gym to conserve energy. Pt performed seated LE ex's with 2# ankle wts bilat: LAQs, marching, step outs, DF/PF 3x10, scap retraction 3x10, overhead raises with pink med ball 2x10, LE scissors 2x10. Standing with RW heel raises x10, marching x10, kick ball alternating LE's until fatigue.  Seated mat (elevated 20.5) 5 TSTS x2 using RW for support/UE: 25.41, 20.19.  Instructed pt on posture correction, neck extension.  Pt states, it's uncomfortable.  PT has discussed importance of proper posture esp with ambulation multiple sessions. Pt  wheeled back to room and left in w/c for next visit, wife present in room.  Pt demonstrates increased tolerance with dynamic standing, LE strength as evidenced by 5TSTS score progression.    Therapy Documentation Precautions:  Precautions Precautions: Fall Recall of Precautions/Restrictions: Intact Precaution/Restrictions Comments: Urostomy/Colostomy, Ab wound vac, B nephrostomy drains Restrictions Weight Bearing Restrictions Per Provider Order: No    Pain: Pain Assessment Pain Scale: 0-10 Pain Score: 0      Therapy/Group: Individual Therapy  [name] Fast 06/12/2024, 11:30 AM

## 2024-06-12 NOTE — Progress Notes (Signed)
 Occupational Therapy Session Note  Patient Details  Name: Kristopher Thompson MRN: [id number] Date of Birth: 1951/07/17  Today's Date: 06/12/2024 OT Individual Time: 9183-9074 OT Individual Time Calculation (min): 69 min    Short Term Goals: Week 1:  OT Short Term Goal 1 (Week 1): Pt will perform stand-step transfer with Min A + RW. OT Short Term Goal 1 - Progress (Week 1): Met OT Short Term Goal 2 (Week 1): Pt will thread LB garment with Min A + LRAD. OT Short Term Goal 2 - Progress (Week 1): Met OT Short Term Goal 3 (Week 1): Pt will tolerate >2 mins of standing activity for carryover in ADLs. OT Short Term Goal 3 - Progress (Week 1): Met Week 2:  OT Short Term Goal 1 (Week 2): Pt will complete transfers at [facility] consistently with LRAD OT Short Term Goal 1 - Progress (Week 2): Met OT Short Term Goal 2 (Week 2): Pt will complete ADL activity in standing for >3 min with LRAD OT Short Term Goal 2 - Progress (Week 2): Met OT Short Term Goal 3 (Week 2): Pt will complete LB dressing at CGA with AE as required OT Short Term Goal 3 - Progress (Week 2): Met Week 3:  OT Short Term Goal 1 (Week 3): STG=LTG d/t ELOS  Skilled Therapeutic Interventions/Progress Updates:    Pt bed level at time of session, 1/10 pain in RLQ around tubes and lines. Wife present throughout session. Pt with flat affect and extended time rapport building. Donned thigh high TEDS and ACE wraps bed level. BP checked multiple times throughout session with the following readings:  Semireclined with TEDS/ACE: 113/76 with HR of 116 Sitting EOB 118/77 HR 127 Static standing 72/39  HR 135 *Quickly returned to sitting and pt reported feeling better* Sitting 124/111 HR 110  Pt performing stand pivot bed <> wheelchair with RW and CGA, driving to sink level with cues for wheelchair management, oral hygiene and face washing with setup. Pt insistent on going back to bed at end of session, stating he cannot sit up in wheelchair 2/2 back  pain. Attempted pillow placement but still wanting to go back to bed. Alarm on call bell in reach. Extended time for all tasks 2/2 BP readings, dizziness management, multiple lines and tubes, etc.    Therapy Documentation Precautions:  Precautions Precautions: Fall Recall of Precautions/Restrictions: Intact Precaution/Restrictions Comments: Urostomy/Colostomy, Ab wound vac, B nephrostomy drains Restrictions Weight Bearing Restrictions Per Provider Order: No    Therapy/Group: Individual Therapy  Chiquita JAYSON Hopping 06/12/2024, 7:18 AM

## 2024-06-12 NOTE — Progress Notes (Signed)
 PROGRESS NOTE   Subjective/Complaints:   Per nursing, refusing orthostasis- I don't see this.  Pt said doesn't fel great today but willing to do therapy.      Still on IVFs- and this afternoon- was told by nursing that had change in lung sounds.  Was having severe orthostasis this late morning- it appears this pt didn't get his AM midodrine   til 10:15- he spent most of the morning with concerning low BP- we changed the times of the Midodrine  to compensate.   Appears pt has UTI- based on U/A.  Will start Keflex, but cannot see urine Cx in computer- reordered just to make sure.      ROS:    Pt denies SOB, abd pain, CP, N/V/C/D, and vision changes (+) orthostasis (+) doesn't feel himself today    Per HPI  Objective:   No results found. Recent Labs    06/11/24 0417  WBC 7.5  HGB 11.1*  HCT 34.0*  PLT 306    Recent Labs    06/11/24 0417  NA 131*  K 4.1  CL 105  CO2 16*  GLUCOSE 94  BUN 23  CREATININE 1.36*  CALCIUM  9.8    Intake/Output Summary (Last 24 hours) at 06/12/2024 1834 Last data filed at 06/12/2024 1255 Gross per 24 hour  Intake 1809.45 ml  Output 2050 ml  Net -240.55 ml        Physical Exam: Vital Signs Blood pressure 129/69, pulse 90, temperature 98.6 F (37 C), temperature source Oral, resp. rate 16, height 5' 11 (1.803 m), weight 88.5 kg, SpO2 97%.          General: awake, alert, appropriate, sitting EOB; wife not in room this AM; down getting him food per pt; NAD HENT: conjugate gaze; oropharynx a little dry CV: regular rate and rhythm- rate in 90's; no JVD Pulmonary: CTA B/L; no W/R/R- good air movement this AM GI: soft, NT, ND, (+)BS Psychiatric: appropriate Neurological: Ox3    Extremities: No significant LE edema- still attached to IVFs-  Skin: Wound on abdomen- abdominal wound with pink granulation tissue, with small amount of loose slough at lower end of wound  still with brownish edges- from brownish drainage of wound- more superficial wound.   Assessment/Plan: 1. Functional deficits which require 3+ hours per day of interdisciplinary therapy in a comprehensive inpatient rehab setting. Physiatrist is providing close team supervision and 24 hour management of active medical problems listed below. Physiatrist and rehab team continue to assess barriers to discharge/monitor patient progress toward functional and medical goals  Care Tool:  Bathing    Body parts bathed by patient: Right arm, Left arm, Chest, Abdomen, Front perineal area, Right upper leg, Left upper leg, Face, Buttocks, Left lower leg, Right lower leg   Body parts bathed by helper: Buttocks, Right lower leg, Left lower leg     Bathing assist Assist Level: Contact Guard/Touching assist     Upper Body Dressing/Undressing Upper body dressing   What is the patient wearing?: Pull over shirt    Upper body assist Assist Level: Set up assist    Lower Body Dressing/Undressing Lower body dressing      What  is the patient wearing?: Pants     Lower body assist Assist for lower body dressing: Contact Guard/Touching assist     Toileting Toileting Toileting Activity did not occur (Clothing management and hygiene only): N/A (no void or bm) (urostomy/ilieostomy)  Toileting assist       Transfers Chair/bed transfer  Transfers assist     Chair/bed transfer assist level: Supervision/Verbal cueing     Locomotion Ambulation   Ambulation assist      Assist level: Contact Guard/Touching assist Assistive device: Walker-rolling Max distance: 15'   Walk 10 feet activity   Assist  Walk 10 feet activity did not occur: Safety/medical concerns (pt too weak, fatigue)  Assist level: Contact Guard/Touching assist Assistive device: Walker-rolling   Walk 50 feet activity   Assist Walk 50 feet with 2 turns activity did not occur: Safety/medical concerns (pt too weak,  fatigue)  Assist level: Contact Guard/Touching assist Assistive device: Walker-rolling    Walk 150 feet activity   Assist Walk 150 feet activity did not occur: Safety/medical concerns (pt too weak, fatigue)  Assist level: Contact Guard/Touching assist Assistive device: Walker-rolling    Walk 10 feet on uneven surface  activity   Assist Walk 10 feet on uneven surfaces activity did not occur: Safety/medical concerns (pt too weak, fatigue)         Wheelchair     Assist Is the patient using a wheelchair?: Yes Type of Wheelchair: Manual    Wheelchair assist level: Supervision/Verbal cueing Max wheelchair distance: 155'    Wheelchair 50 feet with 2 turns activity    Assist    Wheelchair 50 feet with 2 turns activity did not occur:  (pt too weak, fatigue)   Assist Level: Supervision/Verbal cueing   Wheelchair 150 feet activity     Assist  Wheelchair 150 feet activity did not occur:  (pt too weak, fatigue)   Assist Level: Supervision/Verbal cueing   Blood pressure 129/69, pulse 90, temperature 98.6 F (37 C), temperature source Oral, resp. rate 16, height 5' 11 (1.803 m), weight 88.5 kg, SpO2 97%.  Medical Problem List and Plan: 1. Functional deficits secondary to debility 2/2 perforated bowel             -patient may shower              -ELOS/Goals: 10-14 days S            D/c 06/19/24  Pt's OH is really limiting therapy as well as nausea Wife to get products ot help leakage from ostomies  Con't CIR PT, OT -   Limited by orthostasis- changed times of Midodrine .  2.  Antithrombotics: -DVT/anticoagulation:  Mechanical:  Antiembolism stockings, knee (TED hose) Bilateral lower extremities             -antiplatelet therapy: continue Eliquis  3. Pain Management: continue Scheduled Tylenol ; Oxycodone  as needed took 30mg  Oxy IR yesterday , 20mg  thus far today    7/17- took tylenol  this AM- pain down to 2-3/10- will con't regimen and monitor if needs more  with therapy  7/22- Will change tylenol  to 500 mg BID and con't oxy prn  7/23- hasn't required pain meds overnight- doing better  7/24-27- pain control improved  7/28- pain doing well except sides burning this AM- but better than usual  7/29-8/1- taking pain meds ~ q6 hours per last 24 hours 4. Mood/Behavior/Sleep: LCSW to follow for evaluation and support when available.              -  antipsychotic agents: N/A             -anxiety: continue Atarax  25 mg   7/29-  5. Neuropsych/cognition: This patient is capable of making decisions on his own behalf.   6. Complicated wound: WOC nurse consultation             - Continue wound VAC             Zosyn  d/ced as per surgery recommendation   7/26-7 hydrogel and gauze dressing in place. Midline abd wound looks great 7. 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   -doesn't have much appetite but is trying to eat -ate 100-100-25% yesterday 7/28- still nausea- no change with med changes-   7/31- isn't eating well- Albumin  down to 2.5 from 3.3 2 weeks ago.  He's on Prosource 2x/day and supplements 2x/day with max protein- will d/w pt if drinking them 8. AKI/CKD stage IIIb: Creatinine improving 2.3 now 1.91 -monitor BMP   7/17- Cr 2.05- on IVF's 125/cc/hour- will continue for now and recheck labs in AM             - On continuous IVF NS 125 mL/hr   7/18- Will reduce IVFs to 75cc/hour-     Latest Ref Rng & Units 06/11/2024    4:17 AM 06/08/2024    5:41 AM 06/06/2024    5:25 AM  BMP  Glucose 70 - 99  mg/dL 94  90  85   BUN 8 - 23 mg/dL 23  22  28    Creatinine 0.61 - 1.24 mg/dL 8.63  8.75  8.65   Sodium 135 - 145 mmol/L 131  132  135   Potassium 3.5 - 5.1 mmol/L 4.1  3.8  4.4   Chloride 98 - 111 mmol/L 105  106  107   CO2 22 - 32 mmol/L 16  16  17    Calcium  8.9 - 10.3 mg/dL 9.8  9.6  89.9     2/79- reduce IVF to 50cc/hr Recent ECHO showed normal Ej fx, no concerns or signs of fluid overload at this time   7/22- will stop IVF's- IV is leaking and pt wants to try off it.   7/23- labs in AM  7/24- Cr up to 1.28- from 1.08- will recheck Monday- is up a little because pt not drinking as much, will push fluids still  7/27 BUN/Cr   back up yesterday   -continue IVF tonight.   7/28- BUN/Cr 22 and 1.24  7/31- Pt's BUN is down to 23; but Cr up slightly to 1.36- in spite of IVFs- I'm concerned will go home and get dehydrated- we discussed NEEDS to drink more than 8 cups/day.   8/1-  Will stop IVFs for now, because nursing reports pt has wet sounding lungs- will recheck labs in AM 9. DM type II: 04/01/24 Hemoglobin A1c 5.3  -Monitor BS AC/at bedtime and use SSI 7/17-  will change to regular diet and try to have pt make good choices- since poor intake- CBG's low 7/18- BG's 84-125- well controlled- con't regimen- probably low due to poor intake.  CBG (last 3)  Recent Labs    06/12/24 0552 06/12/24 1138 06/12/24 1700  GLUCAP 103* 138* 109*    7/27 tight control 7/30-7/31 great control- con't regimen- doesn't want cake or anything for bday today 10.  Severe Hypotension: Order orthostatic vitals              - teds ordered             -increase midodrine  to 10mg  TID   7/17- BP running 80s systolic and feels lightheaded- Midodrine  was increased yesterday to 10 mg TID- if doesn't improve today, will order ACE wraps as well as Thigh high TED's- cannot do abd binder due to abd drains, VAC and ostomies- if this doesn't improve in next 24 hours, will add Florinef .  Also liberalized his diet to regular  diet so can have some salt to help.   7/18- will add Florinef  0.1 mg daily- advised therapy to add ACE wraps- wasn't started yesterday- pt IS symptomatic- will also continue IVFs for now Vitals:   06/12/24 1200 06/12/24 1659  BP: 100/63 129/69  Pulse: 96 90  Resp: 16 16  Temp: 98.7 F (37.1 C) 98.6 F (37 C)  SpO2: 98% 97%  Overall improved since 7/16 but remains soft cont IVF but at lower rate   7/22- Stopped IVFs since IV is leaking- pt wants ot get rid of IVF's- pt needs to drink 6-8 cups of milk juice or water /day- if labs get worse on Thursday, will restart IVFs. BP's running low 100's systolic- better than before.   7/23- per pt, less [facility]  7/24- still having OH per therapy- will d/w PT/OT before changing meds  7/25- BP 70/s40s standing this AM with ACE wraps and TEDs to thighs- will increase Florinef  to 0.2 mg since has been 1 week since started- will also have everyone push fluids  7/27 sitting and lying readings were almost equal yesterday  7/30- pt's BP dropped to 90s/60s this AM- so will con't TEDs and ACE wraps  7/31- Not dizzy with sitting EOB this AM- does feel a little weak- asked PT to f/u on BP's  8/1- pt didn't AM midodrine - til 10:15- he had difficulty with therapy- increased Midodrine  to 15 mg TID 11. Stage I Obesity: BMI 31.49 kg/m  -educate on diet and weight loss to promote overall health and mobility.   7/17- BMI down to 28.44 7/22- BMI 28.44 7/28- will ask nursing to check weight 7/31- will ask nursing to add Daily weights- concerned he's losing weight when doesn't need to right now 12. Hyperlipidemia: Continue Crestor    13. Screening for vitamin D  deficiency:check vitamin D  level tomorrow 14. Hyponatremia  7/17- Na down to 128- but is on IVF's- hard to fluid restrict due to Renal issues, so will keep on IVF's and recheck in AM  7/18- Na up to 134- will not intervene more  7/22- Na up to 136  7/26- Na 135  7/31- Na 131 15. Poor appetite, nausea-  frequent  7/18- will schedule low dose Compazine  before meals TID due to frequent nausea impacting his ability to eat meals. 7/22- still has severe loss of appetite, even though nausea is doing somewhat better- will Add Periacin 4 mg at bedtime- cannot add Remeron due to prolonged QT interval  7/23- will schedule  Reglan  5 mg TID-AC to also help with nausea . 7/24- better appetite and nausea this AM- ate 2 bowls cheerios! Making gains 7/25- Will increase Compazine  to 10 mg before meals and changed toming of Reglan  to get 1 hour before meals as well   7/27--ate 100-100-25% yesterday   -he's trying!   -encouraged wife to bring in food from home/outside   -continue periactin   7/28- no improvement in nausea- will d/w team  7/29- spoke with pharmacy- cannot add Remeron- due to prolonged QT interval and already on reglan -   7/30- added Zoloft  yesterday- will monitor  7/31- nausea worse in evening with dinner- Albumin  is dropping- on A lot of supplements- not sure he's taking them- will double with wife.  16. UTI- waiting for Urine Cx  8/1-  Has UTI- started Keflex 500 mg BID due to renal impairment-  and waiting for U Cx- reordered, just in case.    I spent a total of 59   minutes on total care today- >50% coordination of care- due to  Spoke with nursing and therapy over 5 different times; and went over timing of meds- and labs- will reorder labs for AM just  due to UTI and see if working- also changed midodrine  timing.      LOS: 16 days A FACE TO FACE EVALUATION WAS PERFORMED  Tyronica Truxillo 06/12/2024, 6:34 PM

## 2024-06-13 DIAGNOSIS — K631 Perforation of intestine (nontraumatic): Secondary | ICD-10-CM | POA: Diagnosis not present

## 2024-06-13 DIAGNOSIS — R5381 Other malaise: Secondary | ICD-10-CM | POA: Diagnosis not present

## 2024-06-13 LAB — CBC WITH DIFFERENTIAL/PLATELET
Abs Immature Granulocytes: 0.29 K/uL — ABNORMAL HIGH (ref 0.00–0.07)
Basophils Absolute: 0.1 K/uL (ref 0.0–0.1)
Basophils Relative: 2 %
Eosinophils Absolute: 0.9 K/uL — ABNORMAL HIGH (ref 0.0–0.5)
Eosinophils Relative: 12 %
HCT: 36 % — ABNORMAL LOW (ref 39.0–52.0)
Hemoglobin: 12 g/dL — ABNORMAL LOW (ref 13.0–17.0)
Immature Granulocytes: 4 %
Lymphocytes Relative: 27 %
Lymphs Abs: 2 K/uL (ref 0.7–4.0)
MCH: 28.6 pg (ref 26.0–34.0)
MCHC: 33.3 g/dL (ref 30.0–36.0)
MCV: 85.9 fL (ref 80.0–100.0)
Monocytes Absolute: 1.1 K/uL — ABNORMAL HIGH (ref 0.1–1.0)
Monocytes Relative: 14 %
Neutro Abs: 3.2 K/uL (ref 1.7–7.7)
Neutrophils Relative %: 41 %
Platelets: 352 K/uL (ref 150–400)
RBC: 4.19 MIL/uL — ABNORMAL LOW (ref 4.22–5.81)
RDW: 14.2 % (ref 11.5–15.5)
WBC: 7.6 K/uL (ref 4.0–10.5)
nRBC: 0 % (ref 0.0–0.2)

## 2024-06-13 LAB — BASIC METABOLIC PANEL WITH GFR
Anion gap: 8 (ref 5–15)
BUN: 25 mg/dL — ABNORMAL HIGH (ref 8–23)
CO2: 18 mmol/L — ABNORMAL LOW (ref 22–32)
Calcium: 10.2 mg/dL (ref 8.9–10.3)
Chloride: 107 mmol/L (ref 98–111)
Creatinine, Ser: 1.24 mg/dL (ref 0.61–1.24)
GFR, Estimated: >60 mL/min (ref >60)
Glucose, Bld: 99 mg/dL (ref 70–99)
Potassium: 3.8 mmol/L (ref 3.5–5.1)
Sodium: 133 mmol/L — ABNORMAL LOW (ref 135–145)

## 2024-06-13 LAB — GLUCOSE, CAPILLARY
Glucose-Capillary: 95 mg/dL (ref 70–99)
Glucose-Capillary: 122 mg/dL — ABNORMAL HIGH (ref 70–99)

## 2024-06-13 NOTE — Progress Notes (Signed)
 PROGRESS NOTE   Subjective/Complaints:   Pt reports BP not as bad as yesterday- we discussed how I changed Midodrine  to 630, 1030 and 230- and to try and push to get it as on time as possible, and if late, don't stand.   This AM, pt's HR went to 137 qith walking 5 ft with PT, however per wife, was getting better as day went on.  Saw him twice.   Also, although legs in ACE wraps and TEDs, pt has no significant LE edema.       ROS:    Pt denies SOB, abd pain, CP, N/V/C/D, and vision changes More himself and less out of it (+) Less tachycardia (+)  Per HPI  Objective:   DG Chest 2 View Result Date: 06/12/2024 CLINICAL DATA:  10031 Cough 10031 EXAM: CHEST - 2 VIEW COMPARISON:  Chest x-ray 05/26/2024, CT chest 04/14/2024 FINDINGS: The heart and mediastinal contours are within normal limits. No focal consolidation. No pulmonary edema. No pleural effusion. No pneumothorax. No acute osseous abnormality. IMPRESSION: No active cardiopulmonary disease. Electronically Signed   By: Morgane  Naveau M.D.   On: 06/12/2024 18:56   Recent Labs    06/11/24 0417 06/13/24 0416  WBC 7.5 7.6  HGB 11.1* 12.0*  HCT 34.0* 36.0*  PLT 306 352    Recent Labs    06/11/24 0417 06/13/24 0416  NA 131* 133*  K 4.1 3.8  CL 105 107  CO2 16* 18*  GLUCOSE 94 99  BUN 23 25*  CREATININE 1.36* 1.24  CALCIUM  9.8 10.2    Intake/Output Summary (Last 24 hours) at 06/13/2024 1747 Last data filed at 06/13/2024 0745 Gross per 24 hour  Intake 3 ml  Output 900 ml  Net -897 ml        Physical Exam: Vital Signs Blood pressure 115/75, pulse (!) 102, temperature 98.7 F (37.1 C), temperature source Oral, resp. rate 16, height 5' 11 (1.803 m), weight 91.9 kg, SpO2 98%.           General: awake, alert, appropriate, in w/c and wife at bedside; PT in room;  NAD HENT: conjugate gaze; oropharynx moist CV: regular rhythm, rate slightly  tachycardic  rate; no JVD Pulmonary: CTA B/L; no W/R/R- good air movement- no fluid overload heard GI: soft, NT, ND, (+)BS- ostomies 1/2 full and C/D/I on abd wound Psychiatric: appropriate Neurological: Ox3  Extremities: No significant LE edema- off IVFs- wearing Teds and ACE wraps B/L  Skin: Wound on abdomen- abdominal wound with pink granulation tissue, with small amount of loose slough at lower end of wound still with brownish edges- from brownish drainage of wound- more superficial wound.   Assessment/Plan: 1. Functional deficits which require 3+ hours per day of interdisciplinary therapy in a comprehensive inpatient rehab setting. Physiatrist is providing close team supervision and 24 hour management of active medical problems listed below. Physiatrist and rehab team continue to assess barriers to discharge/monitor patient progress toward functional and medical goals  Care Tool:  Bathing    Body parts bathed by patient: Right arm, Left arm, Chest, Abdomen, Front perineal area, Right upper leg, Left upper leg, Face, Buttocks, Left lower leg, Right  lower leg   Body parts bathed by helper: Buttocks, Right lower leg, Left lower leg     Bathing assist Assist Level: Contact Guard/Touching assist     Upper Body Dressing/Undressing Upper body dressing   What is the patient wearing?: Pull over shirt    Upper body assist Assist Level: Set up assist    Lower Body Dressing/Undressing Lower body dressing      What is the patient wearing?: Pants     Lower body assist Assist for lower body dressing: Contact Guard/Touching assist     Toileting Toileting Toileting Activity did not occur (Clothing management and hygiene only): N/A (no void or bm) (urostomy/ilieostomy)  Toileting assist       Transfers Chair/bed transfer  Transfers assist     Chair/bed transfer assist level: Contact Guard/Touching assist     Locomotion Ambulation   Ambulation assist      Assist  level: Contact Guard/Touching assist Assistive device: Walker-rolling Max distance: 5   Walk 10 feet activity   Assist  Walk 10 feet activity did not occur: Safety/medical concerns (pt too weak, fatigue)  Assist level: Contact Guard/Touching assist Assistive device: Walker-rolling   Walk 50 feet activity   Assist Walk 50 feet with 2 turns activity did not occur: Safety/medical concerns (pt too weak, fatigue)  Assist level: Contact Guard/Touching assist Assistive device: Walker-rolling    Walk 150 feet activity   Assist Walk 150 feet activity did not occur: Safety/medical concerns (pt too weak, fatigue)  Assist level: Contact Guard/Touching assist Assistive device: Walker-rolling    Walk 10 feet on uneven surface  activity   Assist Walk 10 feet on uneven surfaces activity did not occur: Safety/medical concerns (pt too weak, fatigue)         Wheelchair     Assist Is the patient using a wheelchair?: Yes Type of Wheelchair: Manual    Wheelchair assist level: Supervision/Verbal cueing Max wheelchair distance: 155'    Wheelchair 50 feet with 2 turns activity    Assist    Wheelchair 50 feet with 2 turns activity did not occur:  (pt too weak, fatigue)   Assist Level: Supervision/Verbal cueing   Wheelchair 150 feet activity     Assist  Wheelchair 150 feet activity did not occur:  (pt too weak, fatigue)   Assist Level: Supervision/Verbal cueing   Blood pressure 115/75, pulse (!) 102, temperature 98.7 F (37.1 C), temperature source Oral, resp. rate 16, height 5' 11 (1.803 m), weight 91.9 kg, SpO2 98%.  Medical Problem List and Plan: 1. Functional deficits secondary to debility 2/2 perforated bowel             -patient may shower              -ELOS/Goals: 10-14 days S            D/c 06/19/24  Pt's OH is really limiting therapy as well as nausea Wife to get products ot help leakage from ostomies  Con't CIR PT and OT Feeling more himself today    Limited by orthostasis- changed times of Midodrine .  2.  Antithrombotics: -DVT/anticoagulation:  Mechanical:  Antiembolism stockings, knee (TED hose) Bilateral lower extremities             -antiplatelet therapy: continue Eliquis  3. Pain Management: continue Scheduled Tylenol ; Oxycodone  as needed took 30mg  Oxy IR yesterday , 20mg  thus far today    7/17- took tylenol  this AM- pain down to 2-3/10- will con't regimen and monitor if needs  more with therapy  7/22- Will change tylenol  to 500 mg BID and con't oxy prn  7/23- hasn't required pain meds overnight- doing better  7/24-27- pain control improved  7/28- pain doing well except sides burning this AM- but better than usual  7/29-8/1- taking pain meds ~ q6 hours per last 24 hours 4. Mood/Behavior/Sleep: LCSW to follow for evaluation and support when available.              -antipsychotic agents: N/A             -anxiety: continue Atarax  25 mg   7/29- added Zoloft  5. Neuropsych/cognition: This patient is capable of making decisions on his own behalf.   6. Complicated wound: WOC nurse consultation             - Continue wound VAC             Zosyn  d/ced as per surgery recommendation   7/26-7 hydrogel and gauze dressing in place. Midline abd wound looks great 7. 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   -doesn't have much appetite but is trying to eat -ate 100-100-25% yesterday 7/28- still nausea- no change with med changes-   7/31- isn't eating well- Albumin  down to 2.5 from 3.3 2  weeks ago.  He's on Prosource 2x/day and supplements 2x/day with max protein- will d/w pt if drinking them 8. AKI/CKD stage IIIb: Creatinine improving 2.3 now 1.91 -monitor BMP   7/17- Cr 2.05- on IVF's 125/cc/hour- will continue for now and recheck labs in AM             - On continuous IVF NS 125 mL/hr   7/18- Will reduce IVFs to 75cc/hour-     Latest Ref Rng & Units 06/13/2024    4:16 AM 06/11/2024    4:17 AM 06/08/2024    5:41 AM  BMP  Glucose 70 - 99 mg/dL 99  94  90   BUN 8 - 23 mg/dL 25  23  22    Creatinine 0.61 - 1.24 mg/dL 8.75  8.63  8.75   Sodium 135 - 145 mmol/L 133  131  132   Potassium 3.5 - 5.1 mmol/L 3.8  4.1  3.8   Chloride 98 - 111 mmol/L 107  105  106   CO2 22 - 32 mmol/L 18  16  16    Calcium  8.9 - 10.3 mg/dL 89.7  9.8  9.6     2/79- reduce IVF to 50cc/hr Recent ECHO showed normal Ej fx, no concerns or signs of fluid overload at this time   7/22- will stop IVF's- IV is leaking and pt wants to try off it.   7/23- labs in AM  7/24- Cr up to 1.28- from 1.08- will recheck Monday- is up a little because pt not drinking as much, will push fluids still  7/27 BUN/Cr   back up yesterday   -continue IVF tonight.  7/28- BUN/Cr 22 and 1.24  7/31- Pt's BUN is down to 23; but Cr up slightly to 1.36- in spite of IVFs- I'm concerned will go home and get dehydrated- we discussed NEEDS to drink more than 8 cups/day.   8/1-  Will stop IVFs for now, because nursing reports pt has wet sounding lungs- will recheck labs in AM  8/2- Cr better- but BUN slightly up more to 25- will check labs again in AM to monitor if need to go back on IVFs- pt pushing to drink 6-8 cups/day 9. DM type II: 04/01/24 Hemoglobin A1c 5.3  -Monitor BS AC/at bedtime and use SSI 7/17- will change to regular diet and try to have pt make good choices- since poor intake- CBG's low 7/18- BG's 84-125- well controlled- con't regimen- probably low due to poor intake.  CBG (last 3)  Recent Labs    06/12/24 1700  06/12/24 2126 06/13/24 0605  GLUCAP 109* 115* 95    7/27 tight control 7/30-7/31 great control- con't regimen- doesn't want cake or anything for bday today 10.  Severe Hypotension: Order orthostatic vitals              - teds ordered             -increase midodrine  to 10mg  TID   7/17- BP running 80s systolic and feels lightheaded- Midodrine  was increased yesterday to 10 mg TID- if doesn't improve today, will order ACE wraps as well as Thigh high TED's- cannot do abd binder due to abd drains, VAC and ostomies- if this doesn't improve in next 24 hours, will add Florinef .  Also liberalized his diet to regular diet so can have some salt to help.   7/18- will add Florinef  0.1 mg daily- advised therapy to add ACE wraps- wasn't started yesterday- pt IS symptomatic- will also continue IVFs for now Vitals:   06/13/24 0839 06/13/24 1500  BP: 107/78 115/75  Pulse: (!) 137 (!) 102  Resp:  16  Temp:  98.7 F (37.1 C)  SpO2:  98%  Overall improved since 7/16 but remains soft cont IVF but at lower rate   7/22- Stopped IVFs since IV is leaking- pt wants ot get rid of IVF's- pt needs to drink 6-8 cups of milk juice or water /day- if labs get worse on Thursday, will restart IVFs. BP's running low 100's systolic- better than before.   7/23- per pt, less [facility]  7/24- still having OH per therapy- will d/w PT/OT before changing meds  7/25- BP 70/s40s standing this AM with ACE wraps and TEDs to thighs- will increase Florinef  to 0.2 mg since has been 1 week since started- will also have everyone push fluids  7/27 sitting and lying readings were almost equal yesterday  7/30- pt's BP dropped to 90s/60s this AM- so will con't TEDs and ACE wraps  7/31- Not dizzy with sitting EOB this AM- does feel a little weak- asked PT to f/u on BP's  8/1- pt didn't AM midodrine - til 10:15- he had difficulty with therapy- increased Midodrine  to 15 mg TID  8/2- went over timing with wife and pt- is correct at 0630, 1030 and 230 pm 11.  Stage I Obesity: BMI 31.49 kg/m  -educate on diet and weight loss to promote overall health and mobility.   7/17- BMI down to 28.44 7/22- BMI 28.44 7/28- will ask nursing to check weight 7/31- will ask nursing to add Daily weights- concerned he's losing weight when doesn't need to right now  8/2- BMI 28.26- keeping pretty stable 12. Hyperlipidemia: Continue Crestor    13. Screening for vitamin D  deficiency:check vitamin D  level tomorrow 14. Hyponatremia  7/17- Na down to 128- but is on IVF's- hard to fluid restrict due to Renal issues, so will keep on IVF's and recheck in AM  7/18- Na up to 134- will not intervene more  7/22- Na up to 136  7/26- Na 135  7/31- Na 131  8/2- Na up to 133 15. Poor appetite, nausea- frequent  7/18- will schedule low dose Compazine  before meals TID due to frequent nausea impacting his ability to eat meals. 7/22- still has severe loss of appetite, even though nausea is doing somewhat better- will Add Periacin 4 mg at bedtime- cannot add Remeron due to prolonged QT interval  7/23- will schedule Reglan  5 mg TID-AC to also help with nausea . 7/24- better appetite and nausea this AM- ate 2 bowls cheerios! Making gains 7/25- Will increase Compazine  to 10 mg before meals and changed toming of Reglan  to get 1 hour before meals as well   7/27--ate 100-100-25% yesterday   -he's trying!   -encouraged wife to bring in food from home/outside   -continue periactin   7/28- no improvement in nausea- will d/w team  7/29- spoke with pharmacy- cannot add Remeron- due to prolonged QT interval and already on reglan -   7/30- added Zoloft  yesterday- will monitor  7/31- nausea worse in evening with dinner- Albumin  is dropping- on A lot of supplements- not sure he's taking them- will double with wife.  16. UTI- waiting for Urine Cx  8/1-  Has UTI- started Keflex  500 mg BID due to renal impairment-  and waiting for U Cx- reordered, just in case.   8/2- don't see any results so far  for Cx    I spent a total of 54   minutes on total care today- >50% coordination of care- due to  D/w pt at length and educated about UTI, how I think it's playing a role in BP/tachycardia- and went over Midodrine  timing and why need to start it so early. Also saw pt another time and spoke mainly to wife- and also to PT another time     LOS: 17 days A FACE TO FACE EVALUATION WAS PERFORMED  Asmi Fugere 06/13/2024, 5:47 PM

## 2024-06-13 NOTE — Progress Notes (Signed)
 Physical Therapy Session Note  Patient Details  Name: Kristopher Thompson MRN: [id number] Date of Birth: September 13, 1951  Today's Date: 06/13/2024 PT Individual Time: 0800-0858 PT Individual Time Calculation (min): 58 min   Short Term Goals: Week 1:  PT Short Term Goal 1 (Week 1): = LTG  Skilled Therapeutic Interventions/Progress Updates:  Pt was seen bedside in the am. Pt willing to participate with therapy. BP in supine 125/82 with HR 108. Donned thigh high TED hose and ace wraps. Pt transferred supine to edge of bed with side rail and c/g with increased processing time. Pt tolerated edge of bed with S. Pt's BP on edge of bed is 111/81 with 126. Pt transferred edge of bed to w/c with rolling walker and contact guard and verbal cues. Pt's BP following transfer was 119/82 with HR 125. Md at bedside and cleared pt to try and ambulate short distances. Pt ambulated 5 feet with rolling walker and contact guard with verbal cues. Pt's BP following ambulation 107/78 with HR 137. Following rest break HR decreased to 120. Pt transferred chair to w/c with rolling walker and contact guard with verbal cues. Pt transferred w/c to edge of bed with rolling walker and contact guard. Pt transferred edge of bed to supine with min A and verbal cues. Pt's BP 133/81 with HR 115. Doffed ace wraps and TED hose. Pt left sitting up in bed with all needs within reach. Notified MD about increased HR up to 137 with ambulation.   Therapy Documentation Precautions:  Precautions Precautions: Fall Recall of Precautions/Restrictions: Intact Precaution/Restrictions Comments: Urostomy/Colostomy, Ab wound vac, B nephrostomy drains Restrictions Weight Bearing Restrictions Per Provider Order: No General:   Vital Signs: Therapy Vitals Pulse Rate: (!) 137 BP: 107/78 Patient Position (if appropriate): Sitting Pain: Pt c/o 1/10 abdominal pain.    Therapy/Group: Individual Therapy  Merilee Lynwood MATSU 06/13/2024, 12:13 PM

## 2024-06-14 DIAGNOSIS — R5381 Other malaise: Secondary | ICD-10-CM | POA: Diagnosis not present

## 2024-06-14 LAB — GLUCOSE, CAPILLARY
Glucose-Capillary: 98 mg/dL (ref 70–99)
Glucose-Capillary: 99 mg/dL (ref 70–99)
Glucose-Capillary: 115 mg/dL — ABNORMAL HIGH (ref 70–99)

## 2024-06-14 NOTE — Progress Notes (Signed)
 Occupational Therapy Session Note  Patient Details  Name: Kristopher Thompson MRN: [id number] Date of Birth: 23-May-1951  Today's Date: 06/14/2024 OT Individual Time: 1005-1050 OT Individual Time Calculation (min): 45 min    Short Term Goals: Week 3:  OT Short Term Goal 1 (Week 3): STG=LTG d/t ELOS  Skilled Therapeutic Interventions/Progress Updates:      Therapy Documentation Precautions:  Precautions Precautions: Fall Recall of Precautions/Restrictions: Intact Precaution/Restrictions Comments: Urostomy/Colostomy, Ab wound vac, B nephrostomy drains Restrictions Weight Bearing Restrictions Per Provider Order: No General: Pt supine in bed upon OT arrival, agreeable to OT session.  Vital Signs: HR as high as 140 with activity, before session was 105 in supine without activity  Pain:  no pain reported  ADL: OT providing skilled intervention on ADL retraining in order to increase independence with tasks and increase activity tolerance. Pt completed the following tasks at the current level of assist: Bed mobility: Min A for trunk elevation supine>EOB, SBA EOB>supine using leg lifter, slight dizziness upon sitting, went away >1 min recovery UB dressing: SBA seated on EOB with donning/doffing LB dressing: bed level, pt able bridge at bed level in order to assist with managing pants over waist Footwear: total A ACE wraps and TED hose at bed level for BP management Bathing: Min A seated EOB for sponge bathing   Pt supine in bed with bed alarm activated, 2 bed rails up, call light within reach and 4Ps assessed. Wife present.    Therapy/Group: Individual Therapy  Camie Hoe, OTD, OTR/L 06/14/2024, 12:51 PM

## 2024-06-14 NOTE — Progress Notes (Signed)
 PROGRESS NOTE   Subjective/Complaints:  Pt denies SOB at all, but HR still spiking easily- is low 100's at rest- did appear on chart review to increase ~ 7/28 from 80-90s to 90s-100s- and spiking higher- as well as continued low BP has gotten better with increase in Midodrine .  Feeling better on ABX Has >100k GNR- per his wife, he used to get completely confused and septic when had UTI's in recent past, but these last 2 times, doing better.      ROS:    Pt denies SOB, abd pain, CP, N/V/C/D, and vision changes Still tachycardic (+) Per HPI  Objective:   DG Chest 2 View Result Date: 06/12/2024 CLINICAL DATA:  10031 Cough 10031 EXAM: CHEST - 2 VIEW COMPARISON:  Chest x-ray 05/26/2024, CT chest 04/14/2024 FINDINGS: The heart and mediastinal contours are within normal limits. No focal consolidation. No pulmonary edema. No pleural effusion. No pneumothorax. No acute osseous abnormality. IMPRESSION: No active cardiopulmonary disease. Electronically Signed   By: Morgane  Naveau M.D.   On: 06/12/2024 18:56   Recent Labs    06/13/24 0416  WBC 7.6  HGB 12.0*  HCT 36.0*  PLT 352    Recent Labs    06/13/24 0416  NA 133*  K 3.8  CL 107  CO2 18*  GLUCOSE 99  BUN 25*  CREATININE 1.24  CALCIUM  10.2    Intake/Output Summary (Last 24 hours) at 06/14/2024 0920 Last data filed at 06/14/2024 0819 Gross per 24 hour  Intake 3 ml  Output 1625 ml  Net -1622 ml        Physical Exam: Vital Signs Blood pressure 114/82, pulse (!) 106, temperature 97.9 F (36.6 C), resp. rate 16, height 5' 11 (1.803 m), weight 88.6 kg, SpO2 98%.            General: awake, alert, appropriate, sitting up in bed; wife at bedside; appears comfortable; NAD HENT: conjugate gaze; oropharynx moist CV: regular rhythm, rate low 100's; no JVD Pulmonary: CTA B/L; no W/R/R- good air movement- good effort GI: soft, less TTP, ND, (+)BS; more  hypoactive today- ostomies have just been emptied already this AM- includingdrains Psychiatric: appropriate Neurological: Ox3  Extremities: No significant LE edema-no wraps this AM B/L Skin: Wound on abdomen- abdominal wound with pink granulation tissue, with small amount of loose slough at lower end of wound still with brownish edges- from brownish drainage of wound- more superficial wound.   Assessment/Plan: 1. Functional deficits which require 3+ hours per day of interdisciplinary therapy in a comprehensive inpatient rehab setting. Physiatrist is providing close team supervision and 24 hour management of active medical problems listed below. Physiatrist and rehab team continue to assess barriers to discharge/monitor patient progress toward functional and medical goals  Care Tool:  Bathing    Body parts bathed by patient: Right arm, Left arm, Chest, Abdomen, Front perineal area, Right upper leg, Left upper leg, Face, Buttocks, Left lower leg, Right lower leg   Body parts bathed by helper: Buttocks, Right lower leg, Left lower leg     Bathing assist Assist Level: Contact Guard/Touching assist     Upper Body Dressing/Undressing Upper body dressing   What  is the patient wearing?: Pull over shirt    Upper body assist Assist Level: Set up assist    Lower Body Dressing/Undressing Lower body dressing      What is the patient wearing?: Pants     Lower body assist Assist for lower body dressing: Contact Guard/Touching assist     Toileting Toileting Toileting Activity did not occur (Clothing management and hygiene only): N/A (no void or bm) (urostomy/ilieostomy)  Toileting assist       Transfers Chair/bed transfer  Transfers assist     Chair/bed transfer assist level: Contact Guard/Touching assist     Locomotion Ambulation   Ambulation assist      Assist level: Contact Guard/Touching assist Assistive device: Walker-rolling Max distance: 5   Walk 10 feet  activity   Assist  Walk 10 feet activity did not occur: Safety/medical concerns (pt too weak, fatigue)  Assist level: Contact Guard/Touching assist Assistive device: Walker-rolling   Walk 50 feet activity   Assist Walk 50 feet with 2 turns activity did not occur: Safety/medical concerns (pt too weak, fatigue)  Assist level: Contact Guard/Touching assist Assistive device: Walker-rolling    Walk 150 feet activity   Assist Walk 150 feet activity did not occur: Safety/medical concerns (pt too weak, fatigue)  Assist level: Contact Guard/Touching assist Assistive device: Walker-rolling    Walk 10 feet on uneven surface  activity   Assist Walk 10 feet on uneven surfaces activity did not occur: Safety/medical concerns (pt too weak, fatigue)         Wheelchair     Assist Is the patient using a wheelchair?: Yes Type of Wheelchair: Manual    Wheelchair assist level: Supervision/Verbal cueing Max wheelchair distance: 155'    Wheelchair 50 feet with 2 turns activity    Assist    Wheelchair 50 feet with 2 turns activity did not occur:  (pt too weak, fatigue)   Assist Level: Supervision/Verbal cueing   Wheelchair 150 feet activity     Assist  Wheelchair 150 feet activity did not occur:  (pt too weak, fatigue)   Assist Level: Supervision/Verbal cueing   Blood pressure 114/82, pulse (!) 106, temperature 97.9 F (36.6 C), resp. rate 16, height 5' 11 (1.803 m), weight 88.6 kg, SpO2 98%.  Medical Problem List and Plan: 1. Functional deficits secondary to debility 2/2 perforated bowel             -patient may shower              -ELOS/Goals: 10-14 days S            D/c 06/19/24  Pt's OH is really limiting therapy as well as nausea Wife to get products ot help leakage from ostomies  Con't CIR PT and OT    Limited by orthostasis/tachycardia - changed times of Midodrine .   No other signs of PE/DVT - on Eliquis  5 mg BID 2.   Antithrombotics: -DVT/anticoagulation:  Mechanical:  Antiembolism stockings, knee (TED hose) Bilateral lower extremities             -antiplatelet therapy: continue Eliquis  3. Pain Management: continue Scheduled Tylenol ; Oxycodone  as needed took 30mg  Oxy IR yesterday , 20mg  thus far today    7/17- took tylenol  this AM- pain down to 2-3/10- will con't regimen and monitor if needs more with therapy  7/22- Will change tylenol  to 500 mg BID and con't oxy prn  7/23- hasn't required pain meds overnight- doing better  7/24-27- pain control improved  7/28- pain  doing well except sides burning this AM- but better than usual  7/29-8/1- taking pain meds ~ q6 hours per last 24 hours 4. Mood/Behavior/Sleep: LCSW to follow for evaluation and support when available.              -antipsychotic agents: N/A             -anxiety: continue Atarax  25 mg   7/29- added Zoloft   8/3- Brighter affect 5. Neuropsych/cognition: This patient is capable of making decisions on his own behalf.   6. Complicated wound: WOC nurse consultation             - Continue wound VAC             Zosyn  d/ced as per surgery recommendation   7/26-7 hydrogel and gauze dressing in place. Midline abd wound looks great 7. 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   -doesn't have much appetite but is trying to eat -ate 100-100-25% yesterday 7/28- still nausea- no change with med changes-   7/31- isn't eating well- Albumin  down to 2.5 from 3.3 2 weeks ago.  He's on Prosource 2x/day and  supplements 2x/day with max protein- will d/w pt if drinking them 8. AKI/CKD stage IIIb: Creatinine improving 2.3 now 1.91 -monitor BMP   7/17- Cr 2.05- on IVF's 125/cc/hour- will continue for now and recheck labs in AM             - On continuous IVF NS 125 mL/hr   7/18- Will reduce IVFs to 75cc/hour-     Latest Ref Rng & Units 06/13/2024    4:16 AM 06/11/2024    4:17 AM 06/08/2024    5:41 AM  BMP  Glucose 70 - 99 mg/dL 99  94  90   BUN 8 - 23 mg/dL 25  23  22    Creatinine 0.61 - 1.24 mg/dL 8.75  8.63  8.75   Sodium 135 - 145 mmol/L 133  131  132   Potassium 3.5 - 5.1 mmol/L 3.8  4.1  3.8   Chloride 98 - 111 mmol/L 107  105  106   CO2 22 - 32 mmol/L 18  16  16    Calcium  8.9 - 10.3 mg/dL 89.7  9.8  9.6     2/79- reduce IVF to 50cc/hr Recent ECHO showed normal Ej fx, no concerns or signs of fluid overload at this time   7/22- will stop IVF's- IV is leaking and pt wants to try off it.   7/23- labs in AM  7/24- Cr up to 1.28- from 1.08- will recheck Monday- is up a little because pt not drinking as much, will push fluids still  7/27 BUN/Cr   back up yesterday   -continue IVF tonight.   7/28- BUN/Cr 22 and 1.24  7/31- Pt's BUN is down to 23; but Cr up slightly to 1.36- in spite of IVFs- I'm concerned will go  home and get dehydrated- we discussed NEEDS to drink more than 8 cups/day.   8/1-  Will stop IVFs for now, because nursing reports pt has wet sounding lungs- will recheck labs in AM  8/2- Cr better- but BUN slightly up more to 25- will check labs again in AM to monitor if need to go back on IVFs- pt pushing to drink 6-8 cups/day  8/3- labs in AM 9. DM type II: 04/01/24 Hemoglobin A1c 5.3  -Monitor BS AC/at bedtime and use SSI 7/17- will change to regular diet and try to have pt make good choices- since poor intake- CBG's low 7/18- BG's 84-125- well controlled- con't regimen- probably low due to poor intake.  CBG (last 3)  Recent Labs    06/13/24 0605 06/13/24 2108 06/14/24 0535   GLUCAP 95 122* 98    8/3- still good control- con't regimen 10.  Severe Hypotension: Order orthostatic vitals              - teds ordered             -increase midodrine  to 10mg  TID   7/17- BP running 80s systolic and feels lightheaded- Midodrine  was increased yesterday to 10 mg TID- if doesn't improve today, will order ACE wraps as well as Thigh high TED's- cannot do abd binder due to abd drains, VAC and ostomies- if this doesn't improve in next 24 hours, will add Florinef .  Also liberalized his diet to regular diet so can have some salt to help.   7/18- will add Florinef  0.1 mg daily- advised therapy to add ACE wraps- wasn't started yesterday- pt IS symptomatic- will also continue IVFs for now Vitals:   06/13/24 2053 06/14/24 0414  BP:  114/82  Pulse: 100 (!) 106  Resp:  16  Temp:  97.9 F (36.6 C)  SpO2:  98%  Overall improved since 7/16 but remains soft cont IVF but at lower rate   7/22- Stopped IVFs since IV is leaking- pt wants ot get rid of IVF's- pt needs to drink 6-8 cups of milk juice or water /day- if labs get worse on Thursday, will restart IVFs. BP's running low 100's systolic- better than before.   7/23- per pt, less [facility]  7/24- still having OH per therapy- will d/w PT/OT before changing meds  7/25- BP 70/s40s standing this AM with ACE wraps and TEDs to thighs- will increase Florinef  to 0.2 mg since has been 1 week since started- will also have everyone push fluids  7/27 sitting and lying readings were almost equal yesterday  7/30- pt's BP dropped to 90s/60s this AM- so will con't TEDs and ACE wraps  7/31- Not dizzy with sitting EOB this AM- does feel a little weak- asked PT to f/u on BP's  8/1- pt didn't AM midodrine - til 10:15- he had difficulty with therapy- increased Midodrine  to 15 mg TID  8/2- went over timing with wife and pt- is correct at 0630, 1030 and 230 pm  8/2- HR running low 100's appeared to get worse around 7/28, however no signs of PE other than tachycardia  AND on Eliquis  5mg  BID so very unlikely- probably due to brewing UTI that's improving-  11. Stage I Obesity: BMI 31.49 kg/m  -educate on diet and weight loss to promote overall health and mobility.   7/17- BMI down to 28.44 7/22- BMI 28.44 7/28- will ask nursing to check weight 7/31- will ask nursing to add Daily weights- concerned he's losing weight when doesn't need  to right now 8/2- BMI 28.26- keeping pretty stable 12. Hyperlipidemia: Continue Crestor    13. Screening for vitamin D  deficiency:check vitamin D  level tomorrow 14. Hyponatremia  7/17- Na down to 128- but is on IVF's- hard to fluid restrict due to Renal issues, so will keep on IVF's and recheck in AM  7/18- Na up to 134- will not intervene more  7/22- Na up to 136  7/26- Na 135  7/31- Na 131  8/2- Na up to 133 15. Poor appetite, nausea- frequent  7/18- will schedule low dose Compazine  before meals TID due to frequent nausea impacting his ability to eat meals. 7/22- still has severe loss of appetite, even though nausea is doing somewhat better- will Add Periacin 4 mg at bedtime- cannot add Remeron due to prolonged QT interval  7/23- will schedule Reglan  5 mg TID-AC to also help with nausea . 7/24- better appetite and nausea this AM- ate 2 bowls cheerios! Making gains 7/25- Will increase Compazine  to 10 mg before meals and changed toming of Reglan  to get 1 hour before meals as well   7/27--ate 100-100-25% yesterday   -he's trying!   -encouraged wife to bring in food from home/outside   -continue periactin   7/28- no improvement in nausea- will d/w team  7/29- spoke with pharmacy- cannot add Remeron- due to prolonged QT interval and already on reglan -   7/30- added Zoloft  yesterday- will monitor  7/31- nausea worse in evening with dinner- Albumin  is dropping- on A lot of supplements- not sure he's taking them- will double with wife. 8/3- ate 1.5 bowls of cheerios this AM  16. UTI- waiting for Urine Cx  8/1-  Has UTI-  started Keflex  500 mg BID due to renal impairment-  and waiting for U Cx- reordered, just in case.   8/2- don't see any results so far for Cx  8/3- >100k GNR- still pending    I spent a total of 42  minutes on total care today- >50% coordination of care- due to  [facility] over chart- reviewed in depth vitals, labs, and micro- also d/w pt and wife about his Sx's.    LOS: 18 days A FACE TO FACE EVALUATION WAS PERFORMED  Celena Lanius 06/14/2024, 9:20 AM

## 2024-06-15 DIAGNOSIS — N1832 Chronic kidney disease, stage 3b: Secondary | ICD-10-CM | POA: Diagnosis not present

## 2024-06-15 DIAGNOSIS — N390 Urinary tract infection, site not specified: Secondary | ICD-10-CM

## 2024-06-15 DIAGNOSIS — Z794 Long term (current) use of insulin: Secondary | ICD-10-CM

## 2024-06-15 DIAGNOSIS — R5381 Other malaise: Secondary | ICD-10-CM | POA: Diagnosis not present

## 2024-06-15 DIAGNOSIS — E119 Type 2 diabetes mellitus without complications: Secondary | ICD-10-CM

## 2024-06-15 LAB — CBC WITH DIFFERENTIAL/PLATELET
Abs Immature Granulocytes: 0.45 K/uL — ABNORMAL HIGH (ref 0.00–0.07)
Basophils Absolute: 0.1 K/uL (ref 0.0–0.1)
Basophils Relative: 2 %
Eosinophils Absolute: 1 K/uL — ABNORMAL HIGH (ref 0.0–0.5)
Eosinophils Relative: 11 %
HCT: 39.4 % (ref 39.0–52.0)
Hemoglobin: 12.9 g/dL — ABNORMAL LOW (ref 13.0–17.0)
Immature Granulocytes: 5 %
Lymphocytes Relative: 23 %
Lymphs Abs: 2 K/uL (ref 0.7–4.0)
MCH: 28.2 pg (ref 26.0–34.0)
MCHC: 32.7 g/dL (ref 30.0–36.0)
MCV: 86.2 fL (ref 80.0–100.0)
Monocytes Absolute: 1.2 K/uL — ABNORMAL HIGH (ref 0.1–1.0)
Monocytes Relative: 13 %
Neutro Abs: 4.2 K/uL (ref 1.7–7.7)
Neutrophils Relative %: 46 %
Platelets: 370 K/uL (ref 150–400)
RBC: 4.57 MIL/uL (ref 4.22–5.81)
RDW: 14.4 % (ref 11.5–15.5)
WBC: 9 K/uL (ref 4.0–10.5)
nRBC: 0 % (ref 0.0–0.2)

## 2024-06-15 LAB — BASIC METABOLIC PANEL WITH GFR
Anion gap: 10 (ref 5–15)
BUN: 36 mg/dL — ABNORMAL HIGH (ref 8–23)
CO2: 18 mmol/L — ABNORMAL LOW (ref 22–32)
Calcium: 10.4 mg/dL — ABNORMAL HIGH (ref 8.9–10.3)
Chloride: 105 mmol/L (ref 98–111)
Creatinine, Ser: 1.41 mg/dL — ABNORMAL HIGH (ref 0.61–1.24)
GFR, Estimated: 53 mL/min — ABNORMAL LOW (ref >60)
Glucose, Bld: 101 mg/dL — ABNORMAL HIGH (ref 70–99)
Potassium: 3.9 mmol/L (ref 3.5–5.1)
Sodium: 133 mmol/L — ABNORMAL LOW (ref 135–145)

## 2024-06-15 LAB — URINE CULTURE: Culture: >=100000 — AB

## 2024-06-15 LAB — GLUCOSE, CAPILLARY
Glucose-Capillary: 109 mg/dL — ABNORMAL HIGH (ref 70–99)
Glucose-Capillary: 123 mg/dL — ABNORMAL HIGH (ref 70–99)
Glucose-Capillary: 114 mg/dL — ABNORMAL HIGH (ref 70–99)
Glucose-Capillary: 121 mg/dL — ABNORMAL HIGH (ref 70–99)

## 2024-06-15 MED ORDER — SODIUM CHLORIDE 0.9 % IV BOLUS
1000.0000 mL | INTRAVENOUS | Status: DC
  Administered 2024-06-15 – 2024-06-17 (×2): 1000 mL via INTRAVENOUS

## 2024-06-15 MED ORDER — SODIUM CHLORIDE 0.9 % IV SOLN: INTRAVENOUS | Status: DC

## 2024-06-15 MED ORDER — SODIUM CHLORIDE 0.9 % IV BOLUS: 1000.0000 mL | Freq: Three times a day (TID) | INTRAVENOUS | Status: AC | PRN

## 2024-06-15 MED ORDER — ADULT MULTIVITAMIN W/MINERALS CH
1.0000 | ORAL_TABLET | Freq: Every day | ORAL | Status: DC
  Administered 2024-06-15 – 2024-06-19 (×5): 1 via ORAL
  Filled 2024-06-15 (×5): qty 1

## 2024-06-15 MED ORDER — LEVOFLOXACIN 250 MG PO TABS
250.0000 mg | ORAL_TABLET | ORAL | Status: AC
  Administered 2024-06-15 – 2024-06-19 (×5): 250 mg via ORAL
  Filled 2024-06-15 (×5): qty 1

## 2024-06-15 NOTE — Progress Notes (Signed)
 Occupational Therapy Session Note  Patient Details  Name: Kristopher Thompson MRN: [id number] Date of Birth: 12-02-1950  Today's Date: 06/15/2024 OT Individual Time: 1400-1500 OT Individual Time Calculation (min): 60 min    Short Term Goals: Week 3:  OT Short Term Goal 1 (Week 3): STG=LTG d/t ELOS  Skilled Therapeutic Interventions/Progress Updates:      Therapy Documentation Precautions:  Precautions Precautions: Fall Recall of Precautions/Restrictions: Intact Precaution/Restrictions Comments: Urostomy/Colostomy, Ab wound vac, B nephrostomy drains Restrictions Weight Bearing Restrictions Per Provider Order: No General: Pt supine in bed upon OT arrival, agreeable to OT session. Wife present. Pt completed bed mobility with HOB raised and increased time, OT reiterating importance of bed rails at home for increased independence with bed mobility or option for bed ladder if necessary. OT assisting pt from EOB>W/C with SBA with RW  Pain: no pain reported  Exercises: Pt completed the following exercise circuit in order to improve functional activity, strength and endurance and core strength to prepare for ADLs such as bed mobility (pt's biggest concern). Pt completed the following exercises in seated EOM position with no noted LOB/SOB and 3x10 repetitions on each exercise: -elbow propping to straight arm with BUE -russian twists -modified sit ups -leg lifts with extended leg, simultaneous with both legs to increase core engagement    Pt supine in bed with bed alarm activated, 2 bed rails up, call light within reach and 4Ps assessed.   Therapy/Group: Individual Therapy  Camie Hoe, OTD, OTR/L 06/15/2024, 3:47 PM

## 2024-06-15 NOTE — Progress Notes (Signed)
 Occupational Therapy Session Note  Patient Details  Name: Kristopher Thompson MRN: [id number] Date of Birth: 1951-09-04  Today's Date: 06/15/2024 OT Individual Time: 0815-0900 OT Individual Time Calculation (min): 45 min  and Today's Date: 06/15/2024 OT Missed Time: 10 Minutes Missed Time Reason: Other (comment) (MD rounding)   Short Term Goals: Week 3:  OT Short Term Goal 1 (Week 3): STG=LTG d/t ELOS  Skilled Therapeutic Interventions/Progress Updates:  Pt greeted resting in bed for skilled OT session with focus on functional transfers/mobility and general conditioning. Pt missing ~10 mins of skilled intervention due to trauma physician assessment.   Pain: Pt with un-rated pain at line sites, OT offering intermediate rest breaks and positioning suggestions throughout session to address pain/fatigue and maximize participation/safety in session.   Functional Transfers: Bed mobility with supervision + use of bed features. Sit<>stands with close supervision + RW, bed elevated for energy conservation.   Self Care Tasks: Pt declines self care needs this session.   Therapeutic Activities: Pt ambulates ~23ft from EOB>door with close supervision + RW. Increased SOB, see vitals below.   Therapeutic Exercise: Pt completes 3x2min cycles of kinetron modality for BLE strengthening and activity tolerance for carryover into ADL participation/safety. Pt tolerates progress increase of resistance with HR ranging from 118-124 BPM. Hydration encouraged with minimal implementation.    Vitals: Supine: BP=120/83/HR=100 (no TEDs/ACE wraps) Sitting EOB: BP=102/79/HR=133 (TEDS/ACE wraps) Post Ambulation: BP=94/74/HR=140 Post Kinetron: BP=115/84/HR=122.   Pt remained sitting in WC with 4Ps assessed and immediate needs met. Pt continues to be appropriate for skilled OT intervention to promote further functional independence in ADLs/IADLs.   Therapy Documentation Precautions:  Precautions Precautions:  Fall Recall of Precautions/Restrictions: Intact Precaution/Restrictions Comments: Urostomy/Colostomy, Ab wound vac, B nephrostomy drains Restrictions Weight Bearing Restrictions Per Provider Order: No   Therapy/Group: Individual Therapy  [name], OTR/L, MSOT  06/15/2024, 6:12 AM

## 2024-06-15 NOTE — Progress Notes (Signed)
 Physical Therapy Session Note  Patient Details  Name: Kristopher Thompson MRN: [id number] Date of Birth: November 16, 1950  Today's Date: 06/15/2024 PT Individual Time: 0930-1045 PT Individual Time Calculation (min): 75 min  and Today's Date: 06/15/2024 PT Group Time:  -     Short Term Goals: Week 1:  PT Short Term Goal 1 (Week 1): = LTG  Skilled Therapeutic Interventions/Progress Updates:    Pt up in w/c when PT arrived, pt agreeable for session.  Nsg present for med management. Pt BP at rest 119/82, standing 109/82.  HR at rest 109, after standing 123. Pt wheeled to main gym to conserve energy.  Attempted using rollator, which is too unsteady for pt. PT discussed with pt the RW would be safest for him.  Wife reports a RW has been ordered and delivered to home. Session focusing on upright posture, neck extensors in standing.  Pt ambulated using Eva walker to assist with upper body upright position, cueing to keep head up at times.  Pt will intermittently volitionally left head up to look ahead.  Posture improved with Elyn walker.  Pt amb x~135' with x3 rest breaks due to fatigue/weakness. HR between 112-123 during session.  Pt does not c/o dizziness.  Pt wheeled back to room and transferred back into bed using RW, supervision.  Bed mobility min assist for LE and trunk positioning. Wife present in room.    Therapy Documentation Precautions:  Precautions Precautions: Fall Recall of Precautions/Restrictions: Intact Precaution/Restrictions Comments: Urostomy/Colostomy, Ab wound vac, B nephrostomy drains Restrictions Weight Bearing Restrictions Per Provider Order: No  Pain:0/10    Therapy/Group: Individual Therapy  [name] Fast 06/15/2024, 12:45 PM

## 2024-06-15 NOTE — Plan of Care (Signed)
  Problem: Consults Goal: RH GENERAL PATIENT EDUCATION Description: See Patient Education module for education specifics. Outcome: Progressing   Problem: RH BOWEL ELIMINATION Goal: RH STG MANAGE BOWEL WITH ASSISTANCE Description: STG Manage Bowel with minimal assistance Assistance. Outcome: Progressing   Problem: RH BLADDER ELIMINATION Goal: RH STG MANAGE BLADDER WITH ASSISTANCE Description: STG Manage Bladder With minimal  Assistance Outcome: Progressing   Problem: RH SKIN INTEGRITY Goal: RH STG SKIN FREE OF INFECTION/BREAKDOWN Description: Manage skin free of infection/breakdown with minimal assistance Outcome: Progressing   Problem: RH SAFETY Goal: RH STG ADHERE TO SAFETY PRECAUTIONS W/ASSISTANCE/DEVICE Description: STG Adhere to Safety Precautions With minimal assistance Assistance/Device. Outcome: Progressing   Problem: RH PAIN MANAGEMENT Goal: RH STG PAIN MANAGED AT OR BELOW PT'S PAIN GOAL Description: <4 w/ prns Outcome: Progressing   Problem: RH KNOWLEDGE DEFICIT GENERAL Goal: RH STG INCREASE KNOWLEDGE OF SELF CARE AFTER HOSPITALIZATION Description: Manage increase knowledge of self care after hospitalization with minimal assistance from spouse using educational materials provided Outcome: Progressing

## 2024-06-15 NOTE — Progress Notes (Signed)
 Occupational Therapy Session Note  Patient Details  Name: Kristopher Thompson MRN: [id number] Date of Birth: Oct 19, 1951  Today's Date: 06/15/2024 OT Individual Time: 8861-8797 OT Individual Time Calculation (min): 24 min    Short Term Goals: Week 3:  OT Short Term Goal 1 (Week 3): STG=LTG d/t ELOS  Skilled Therapeutic Interventions/Progress Updates:    Pt received supine with no c/o pain, agreeable to OT session. Pt completed BUE therex with a 5 lb dowel. Completed to challenge BUE strength and endurance required for maximal independence with ADLs and ADL transfers. Pt completed bicep curls with anterior deltoid raise combo, tricep extension, and chest press. Cueing required to ensure proper form and technique for proper muscle activation. 3x10 repetitions. Pt left supine with all needs met.    Therapy Documentation Precautions:  Precautions Precautions: Fall Recall of Precautions/Restrictions: Intact Precaution/Restrictions Comments: Urostomy/Colostomy, Ab wound vac, B nephrostomy drains Restrictions Weight Bearing Restrictions Per Provider Order: No  Therapy/Group: Individual Therapy  [name] 06/15/2024, 6:22 AM

## 2024-06-15 NOTE — Progress Notes (Signed)
 06/15/2024  Kristopher Thompson [id number] [birth year]  CARE TEAM: PCP: [name], MD  Outpatient Care Team: Patient Care Team: [name], MD as PCP - General (Internal Medicine) [name], MD as Consulting Physician (Dermatology) [name], MD as Referring Physician (Pulmonary Disease) [name], MD as Consulting Physician (General Surgery) [name], MD as Consulting Physician (Urology)  Inpatient Treatment Team: Treatment Team:  Lovorn, Megan, MD Leonar, Angelina G, RN Leak, Daphne CROME, NP Misa, Arland RAMAN, PT [place], [place] KANDICE KEN Irven Doyal CHRISTELLA, RN [name], MD [name], MD Creasie Camie KANDICE, OT Waldemar Anes, Student-PT Nicholaus Nena DEL, OT [place], Nereida CHRISTELLA, OT Kit Geni LABOR, LPN [place], [place], RPH Ashol, Genora SAILOR, [place]   Problem List:   Principal Problem:   Debility Active Problems:   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 08/08/2017   S/P ileal conduit (HCC)   GERD (gastroesophageal reflux disease)   Obesity (BMI 35.0-39.9 without comorbidity)   Irritant contact dermatitis associated with fecal stoma   Chronic anticoagulation   History of bladder cancer   Obstructive sleep apnea of adult   Personal history of PE (pulmonary embolism)   Delayed bowel perforation s/p SBR/end ileostomy   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   Ileostomy in place [facility])   H/O insertion of nephrostomy tube   High output ileostomy (HCC)   CKD stage 3b, GFR 30-44 ml/min (HCC)   Adjustment disorder with depressed mood   04/10/2024  POST-OPERATIVE DIAGNOSIS:  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   PROCEDURE:   -ROBOTIC LYSIS OF ADHESIONS X 4 HOURS -COMPONENT SEPARATION - TRANSVERSUS ABDOMINIS REALEASE (TAR) BILATERAL -ROBOTIC REPAIRS OF  INCISIONAL, PARASTOMAL, LEFT INGUINAL  INCARCERATED ABDOMINAL WALL HERNIAS WITH MESH -UROSTOMY ILEAL CONDUIT  REVISION -INTRAOPERATIVE ASSESSMENT OF TISSUE VASCULAR PERFUSION USING ICG (indocyanine green ) -IMMUNOFLUORESCENCE -TRANSVERSUS ABDOMINIS PLANE (TAP) BLOCK - BILATERAL    SURGEON:  [name], MD   04/20/2024 POST-OPERATIVE DIAGNOSIS:  perforated bowel   PROCEDURE:  Drainage of right abdominal wall abscess as well as intra-abdominal abscess Explantation of abdominal mesh Small bowel resection (in discontinuity) Placement of ABThera wound VAC   SURGEON:  [name], MD  04/21/2024  POST-OPERATIVE DIAGNOSIS:  DELAYED BOWEL PERFORATION WITH OPEN ABDOMEN   PROCEDURE:   LYSIS OF ADHESIONS X ABDOMINAL WALL DEBRIDEMENT ILEAL RESECTION END ILEOSTOMY ABDOMINAL WALL PARASTOMAL & INCISIONAL HERNIA REPAIRS WITH PHASIX MESH FASCIA CLOSURE WITH WOUND VAC PLACEMENT IN SQ   SURGEON:  [name], MD    05/04/2024 Interventional Radiology Procedure Note   Procedure: Bilateral percutaneous nephrostomy tube placements   Findings: Please refer to procedural dictation for full description. 10 Fr bilateral to bag drainage.   Complications: None immediate   Estimated Blood Loss: < 5 ml   Recommendations: Keep to bag drainage.  IR will follow.  Ultimate management as per Urology.   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  Adjusted 6/30, 7/1, 7/3 with stent going from kidney out urostomy across the presumed mid ileal on the right side leak.  Urine draining into urostomy and right lower  quadrant surgical Blake drain in the  abdominal wall resting just outside the ileal conduit.  Nephrostomy tubes & stenting per [name] [facility].    Abdominal wall Blake drain creatinine in normal serous range since 05/21/2024.  Only 1 outlier 7/18, BUT follow-up 7/23 7/31 normal reassuring   Agree with Dr. Renda to consider loopogram this week to see if there is objective.  Looks like does loopagram fluoro w CT abd/pelvis to follow given the complexity.    That there is no leak.  If there is no then consider capping versus removal of nephrostomy tubes and possible removal of abdominal wall drain.  Sent message to Dr. Vaughn to see what he thinks  I would keep abdominal wall drain around the ileal conduit for last as a marker to make sure that the urine leak remains resolved.    If not controlled with urinary diversion and stenting, he may require revision of his ileal conduit.  That would be very high risk.  Understand they wish to hold off but do not want to have nephrostomy tubes indefinitely either if possible.  Defer to Dr. Renda urology with interventional radiology   Serum BUN & creatinine slowly coming down.  Likelihood of repeat dialysis seems rather low.  Nephrology following more peripherally  Nutrition:  PO Diet as tolerated.  Regular diet.  Albumin  2.5 but prealbumin in the 20s as of last week.  Recheck every Thursday  High output ileostomy.  Challenge and malnourished patient.  Output better controlled with regimen.    Patient would be at risk for orthostasis which apparently is a concern.  Can consider intermittent IV fluid boluses to compensate.  He could benefit from PICC line and IV fluids 1 L LR normal saline Monday Wednesday Friday x 6 weeks.  PICC line is out, so hold off on replacing unless cannot control.     Infection due to delayed bowel perforation and abdominal wall infection/necrosis  History of growing multiple organisms including Candida albicans, Enterobacter, and Enterococcus.     IV Zosyn /micafungin  x6 weeks, completed 7/7.    I see no evidence of infection.  I see no reason to do IV antibiotics.  Midline incision wound     Wound very superficial now with good granulation.   Switch to just packing once a day.   Consider dry dressings  Metabolic encephalopathy resolved.  Follow.  Insomnia improved  ABLA on top of anemia of chronic disease improved with transfusion and iron  supplementation    -monitor electrolytes & replace as needed.  Keep K>4, Mg>2, Phos>3.    -Diabetes.  Sliding scale insulin .    -VTE prophylaxis-  Full anticoagulation given history of pulmonary embolism.  Eliquis .  -Mobilize as tolerated.  That is starting to happen with the help of inpatient rehab external encouraged him to get out of bed to rehab more.    I updated the patient's status to the the patient, wife, and nurses.  Recommendations were made.  Questions were answered.  They expressed understanding & appreciation.  -Disposition:    I reviewed nursing notes, last 24 h vitals and pain scores, last 48 h intake and output, last 24 h labs and trends, and last 24 h imaging results.  I have reviewed this patient's available data, including medical history, events of note, test results, etc as part of my evaluation.   A significant portion of that time was spent in counseling. Care during the described time interval was provided by me.  This care  required high  level of medical decision making.  06/15/2024    Subjective: (Chief complaint)   Pt w wife in room Claims to feel headed when he is getting up.  Eating okay.  Abdominal wall drain output not recorded for several days.  Discussed with nurse.  Apparently very low output    Objective:  Vital signs:  Vitals:   06/14/24 0500 06/14/24 1354 06/14/24 2012 06/15/24 0437  BP:  121/81 114/82 105/71  Pulse:  (!) 102 (!) 110 96  Resp:  20 20 20   Temp:  98 F (36.7 C) 97.8 F (36.6 C) 98.3 F (36.8 C)  TempSrc:    Oral Oral  SpO2:  98% 98% 98%  Weight: 88.6 kg   88.8 kg  Height:        Last BM Date : 06/14/24  Intake/Output   Yesterday:  08/03 0701 - 08/04 0700 In: 357 [P.O.:354; I.V.:3] Out: 925 [Urine:725; Stool:200] This shift:  No intake/output data recorded.  Bowel function:  Flatus: YES  BM:  YES  Drains:  RLQ surgical Blake drain to biliary gravity bag drain (rests between abdominal wall and Phasix mesh): Some old fat deposits I milked.  Thinly serous   R & L back nephrostomy tubes in place.  -  scantlight yellow-colored -scant  Physical Exam:  General: Resting in no acute distress.  Legs crossed looking relaxed.  Baseline of being stoic but interactive.  Actually more chatty today..   Eyes: Glasses PERRL, normal EOM.  Sclera clear.  No icterus Neuro: CN II-XII intact w/o focal sensory/motor deficits. Lymph: No head/neck/groin lymphadenopathy Psych:  No delerium/psychosis/paranoia.  No agitation HENT: Normocephalic, Mucus membranes moist.  No thrush.  Mildly hard of hearing Neck: Supple, No tracheal deviation.  No obvious thyromegaly Chest: No pain to chest wall compression.  Good respiratory excursion.   CV:  Pulses intact.  Regular rhythm.  No extremity edema MS:  No obvious deformity  Abdomen:  Obese Soft.  Nondistended.  Mild soreness around abdominal surgical Blake drain.  Some erythema/ecchymosis along right flank consistent with end ileostomy stomal dermatitis/leaking.  Not consistent with cellulitis  Midline abd wound 10 x 2-5cm wide.  superifical 5-10 mm deep broad w good granulation   Ext:   No deformity.  No edema.  No cyanosis Skin: No petechiae / purpurea.  No major sores.  Warm and dry    Results:   Cultures: Recent Results (from the past 720 hours)  Culture, blood (Routine X 2) w Reflex to ID Panel     Status: None   Collection Time: 05/26/24 12:28 AM   Specimen: BLOOD LEFT ARM  Result Value Ref Range Status   Specimen Description BLOOD LEFT ARM   Final   Special Requests   Final    BOTTLES DRAWN AEROBIC AND ANAEROBIC Blood Culture adequate volume   Culture   Final    NO GROWTH 5 DAYS Performed at [facility], [address], [place], [place] [address]    Report Status 05/31/2024 FINAL  Final  Culture, blood (Routine X 2) w Reflex to ID Panel     Status: None   Collection Time: 05/26/24 12:53 AM   Specimen: BLOOD RIGHT HAND  Result Value Ref Range Status   Specimen Description BLOOD RIGHT HAND  Final   Special Requests   Final    BOTTLES DRAWN AEROBIC AND ANAEROBIC Blood Culture adequate volume   Culture   Final    NO GROWTH 5 DAYS Performed  at [facility], [address], [place], [place] [address]    Report Status 05/31/2024 FINAL  Final  Urine Culture (for pregnant, neutropenic or urologic patients or patients with an indwelling urinary catheter)     Status: Abnormal   Collection Time: 06/11/24  3:13 PM   Specimen: Urine, Bag (ped)  Result Value Ref Range Status   Specimen Description URINE, BAG PED  Final   Special Requests   Final    NONE Performed at [facility], [address], [place], [place] [address]    Culture (A)  Final    >=100,000 COLONIES/mL KLEBSIELLA PNEUMONIAE 50,000 COLONIES/mL CITROBACTER FREUNDII    Report Status 06/15/2024 FINAL  Final   Organism ID, Bacteria KLEBSIELLA PNEUMONIAE (A)  Final   Organism ID, Bacteria CITROBACTER FREUNDII (A)  Final      Susceptibility   Citrobacter freundii - MIC*    CEFEPIME  <=0.12 SENSITIVE Sensitive     CEFTAZIDIME <=1 SENSITIVE Sensitive     CEFTRIAXONE  <=0.25 SENSITIVE Sensitive     CIPROFLOXACIN  <=0.25 SENSITIVE Sensitive     GENTAMICIN  <=1 SENSITIVE Sensitive     IMIPENEM 1 SENSITIVE Sensitive     TRIMETH /SULFA  <=20 SENSITIVE Sensitive     PIP/TAZO <=4 SENSITIVE Sensitive ug/mL    * 50,000 COLONIES/mL CITROBACTER FREUNDII   Klebsiella pneumoniae - MIC*    AMPICILLIN >=32 RESISTANT Resistant     CEFEPIME  <=0.12 SENSITIVE Sensitive      CEFTAZIDIME <=1 SENSITIVE Sensitive     CEFTRIAXONE  <=0.25 SENSITIVE Sensitive     CIPROFLOXACIN  <=0.25 SENSITIVE Sensitive     GENTAMICIN  <=1 SENSITIVE Sensitive     IMIPENEM <=0.25 SENSITIVE Sensitive     TRIMETH /SULFA  <=20 SENSITIVE Sensitive     AMPICILLIN/SULBACTAM 8 SENSITIVE Sensitive     PIP/TAZO <=4 SENSITIVE Sensitive ug/mL    * >=100,000 COLONIES/mL KLEBSIELLA PNEUMONIAE     Labs: Results for orders placed or performed during the hospital encounter of 05/27/24 (from the past 48 hours)  Glucose, capillary     Status: Abnormal   Collection Time: 06/13/24  9:08 PM  Result Value Ref Range   Glucose-Capillary 122 (H) 70 - 99 mg/dL    Comment: Glucose reference range applies only to samples taken after fasting for at least 8 hours.  Glucose, capillary     Status: None   Collection Time: 06/14/24  5:35 AM  Result Value Ref Range   Glucose-Capillary 98 70 - 99 mg/dL    Comment: Glucose reference range applies only to samples taken after fasting for at least 8 hours.  Glucose, capillary     Status: None   Collection Time: 06/14/24  5:07 PM  Result Value Ref Range   Glucose-Capillary 99 70 - 99 mg/dL    Comment: Glucose reference range applies only to samples taken after fasting for at least 8 hours.  Glucose, capillary     Status: Abnormal   Collection Time: 06/14/24  9:58 PM  Result Value Ref Range   Glucose-Capillary 115 (H) 70 - 99 mg/dL    Comment: Glucose reference range applies only to samples taken after fasting for at least 8 hours.  Basic metabolic panel     Status: Abnormal   Collection Time: 06/15/24  4:02 AM  Result Value Ref Range   Sodium 133 (L) 135 - 145 mmol/L   Potassium 3.9 3.5 - 5.1 mmol/L   Chloride 105 98 - 111 mmol/L   CO2 18 (L) 22 - 32 mmol/L  Glucose, Bld 101 (H) 70 - 99 mg/dL    Comment: Glucose reference range applies only to samples taken after fasting for at least 8 hours.   BUN 36 (H) 8 - 23 mg/dL   Creatinine, Ser 8.58 (H) 0.61 -  1.24 mg/dL   Calcium  10.4 (H) 8.9 - 10.3 mg/dL   GFR, Estimated 53 (L) >60 mL/min    Comment: (NOTE) Calculated using the CKD-EPI Creatinine Equation (2021)    Anion gap 10 5 - 15    Comment: Performed at [facility], [address], [place], [place] [address]  CBC with Differential/Platelet     Status: Abnormal   Collection Time: 06/15/24  4:02 AM  Result Value Ref Range   WBC 9.0 4.0 - 10.5 K/uL   RBC 4.57 4.22 - 5.81 MIL/uL   Hemoglobin 12.9 (L) 13.0 - 17.0 g/dL   HCT 60.5 60.9 - 47.9 %   MCV 86.2 80.0 - 100.0 fL   MCH 28.2 26.0 - 34.0 pg   MCHC 32.7 30.0 - 36.0 g/dL   RDW 85.5 88.4 - 84.4 %   Platelets 370 150 - 400 K/uL   nRBC 0.0 0.0 - 0.2 %   Neutrophils Relative % 46 %   Neutro Abs 4.2 1.7 - 7.7 K/uL   Lymphocytes Relative 23 %   Lymphs Abs 2.0 0.7 - 4.0 K/uL   Monocytes Relative 13 %   Monocytes Absolute 1.2 (H) 0.1 - 1.0 K/uL   Eosinophils Relative 11 %   Eosinophils Absolute 1.0 (H) 0.0 - 0.5 K/uL   Basophils Relative 2 %   Basophils Absolute 0.1 0.0 - 0.1 K/uL   Immature Granulocytes 5 %   Abs Immature Granulocytes 0.45 (H) 0.00 - 0.07 K/uL    Comment: Performed at [facility], [address] [place], [place] [address]  Glucose, capillary     Status: Abnormal   Collection Time: 06/15/24  6:07 AM  Result Value Ref Range   Glucose-Capillary 109 (H) 70 - 99 mg/dL    Comment: Glucose reference range applies only to samples taken after fasting for at least 8 hours.    Imaging / Studies: No results found.        Medications / Allergies: per chart  Antibiotics: Anti-infectives (From admission, onward)    Start     Dose/Rate Route Frequency Ordered Stop   06/12/24 1945  cephALEXin  (KEFLEX ) capsule 500 mg        500 mg Oral Every 12 hours 06/12/24 1847 06/19/24 1959   05/27/24 2200  piperacillin -tazobactam (ZOSYN ) IVPB 3.375 g  Status:  Discontinued        3.375 g 12.5 mL/hr over 240 Minutes Intravenous Every 8 hours 05/27/24 1505 05/27/24  1727         Note: Portions of this report may have been transcribed using voice recognition software. Every effort was made to ensure accuracy; however, inadvertent computerized transcription errors may be present.   Any transcriptional errors that result from this process are unintentional.    Elspeth KYM Schultze, MD, FACS, MASCRS Esophageal, Gastrointestinal & Colorectal Surgery Robotic and Minimally Invasive Surgery  [facility] [address] [place], [place] [address] [phone] Fax [phone] Main  CONTACT INFORMATION: Weekday (9AM-5PM): Call [facility] main office at [phone] Weeknight (5PM-9AM) or Weekend/Holiday: Check EPIC Web Links tab & use AMION (password  [email or web address]) for General Surgery [facility] coverage  Please, DO NOT use SecureChat  (  it is not reliable communication to reach operating surgeons & will lead to a delay in care).   Epic staff messaging available for outptient concerns needing 1-2 business day response.      06/15/2024  9:40 AM

## 2024-06-15 NOTE — Progress Notes (Addendum)
 PROGRESS NOTE   Subjective/Complaints: Working with therapy this morning.  Continues to have heart rate elevations with  mild activity.  Nursing trying to encourage oral fluids.    ROS:    Pt denies HA, SOB, abd pain, CP, N/V/C/D, and vision changes Still tachycardic (+) Per HPI     Objective:   No results found.  Recent Labs    06/13/24 0416 06/15/24 0402  WBC 7.6 9.0  HGB 12.0* 12.9*  HCT 36.0* 39.4  PLT 352 370    Recent Labs    06/13/24 0416 06/15/24 0402  NA 133* 133*  K 3.8 3.9  CL 107 105  CO2 18* 18*  GLUCOSE 99 101*  BUN 25* 36*  CREATININE 1.24 1.41*  CALCIUM  10.2 10.4*    Intake/Output Summary (Last 24 hours) at 06/15/2024 1334 Last data filed at 06/15/2024 1100 Gross per 24 hour  Intake 240 ml  Output 1150 ml  Net -910 ml        Physical Exam: Vital Signs Blood pressure 105/71, pulse (!) 107, temperature 98.3 F (36.8 C), temperature source Oral, resp. rate 20, height 5' 11 (1.803 m), weight 88.8 kg, SpO2 98%.   General: awake, alert, appropriate, sitting in wheelchair working with therapy in the gym/hallway, NAD HENT: conjugate gaze; oropharynx moist CV: regular rhythm, tachycardia- 100s, no JVD Pulmonary: CTA B/L; no W/R/R- good air movement- good effort GI: soft, less TTP, ND, (+)BS; - ostomies  and drains in place Psychiatric: appropriate Neurological: Ox3  Extremities: No significant LE edema-legs wrapped today Skin: Wound on abdomen- abdominal wound with pink granulation tissue, with small amount of loose slough at lower end of wound still with brownish edges- from brownish drainage of wound- more superficial wound-not examined today  Assessment/Plan: 1. Functional deficits which require 3+ hours per day of interdisciplinary therapy in a comprehensive inpatient rehab setting. Physiatrist is providing close team supervision and 24 hour management of active medical problems  listed below. Physiatrist and rehab team continue to assess barriers to discharge/monitor patient progress toward functional and medical goals  Care Tool:  Bathing    Body parts bathed by patient: Right arm, Left arm, Chest, Abdomen, Front perineal area, Right upper leg, Left upper leg, Face, Buttocks, Left lower leg, Right lower leg   Body parts bathed by helper: Buttocks, Right lower leg, Left lower leg     Bathing assist Assist Level: Contact Guard/Touching assist     Upper Body Dressing/Undressing Upper body dressing   What is the patient wearing?: Pull over shirt    Upper body assist Assist Level: Set up assist    Lower Body Dressing/Undressing Lower body dressing      What is the patient wearing?: Pants     Lower body assist Assist for lower body dressing: Contact Guard/Touching assist     Toileting Toileting Toileting Activity did not occur (Clothing management and hygiene only): N/A (no void or bm) (urostomy/ilieostomy)  Toileting assist       Transfers Chair/bed transfer  Transfers assist     Chair/bed transfer assist level: Supervision/Verbal cueing     Locomotion Ambulation   Ambulation assist      Assist level:  Contact Guard/Touching assist Assistive device: Other (comment) (Eva walker) Max distance: 135'   Walk 10 feet activity   Assist  Walk 10 feet activity did not occur: Safety/medical concerns (pt too weak, fatigue)  Assist level: Contact Guard/Touching assist Assistive device: Other (comment) (Eva walker)   Walk 50 feet activity   Assist Walk 50 feet with 2 turns activity did not occur: Safety/medical concerns (pt too weak, fatigue)  Assist level: Contact Guard/Touching assist Assistive device: Other (comment) (Eva walker)    Walk 150 feet activity   Assist Walk 150 feet activity did not occur: Safety/medical concerns (pt too weak, fatigue)  Assist level: Contact Guard/Touching assist Assistive device:  Walker-rolling    Walk 10 feet on uneven surface  activity   Assist Walk 10 feet on uneven surfaces activity did not occur: Safety/medical concerns (pt too weak, fatigue)         Wheelchair     Assist Is the patient using a wheelchair?: Yes Type of Wheelchair: Manual    Wheelchair assist level: Supervision/Verbal cueing Max wheelchair distance: 155'    Wheelchair 50 feet with 2 turns activity    Assist    Wheelchair 50 feet with 2 turns activity did not occur:  (pt too weak, fatigue)   Assist Level: Supervision/Verbal cueing   Wheelchair 150 feet activity     Assist  Wheelchair 150 feet activity did not occur:  (pt too weak, fatigue)   Assist Level: Supervision/Verbal cueing   Blood pressure 105/71, pulse (!) 107, temperature 98.3 F (36.8 C), temperature source Oral, resp. rate 20, height 5' 11 (1.803 m), weight 88.8 kg, SpO2 98%.  Medical Problem List and Plan: 1. Functional deficits secondary to debility 2/2 perforated bowel             -patient may shower              -ELOS/Goals: 10-14 days S            D/c 06/19/24  Pt's OH is really limiting therapy as well as nausea Wife to get products ot help leakage from ostomies  Con't CIR PT and OT  Limited by orthostasis/tachycardia - changed times of Midodrine .   No other signs of PE/DVT - on Eliquis  5 mg BID  Team conference tomorrow 2.  Antithrombotics: -DVT/anticoagulation:  Mechanical:  Antiembolism stockings, knee (TED hose) Bilateral lower extremities             -antiplatelet therapy: continue Eliquis  3. Pain Management: continue Scheduled Tylenol ; Oxycodone  as needed took 30mg  Oxy IR yesterday , 20mg  thus far today    7/17- took tylenol  this AM- pain down to 2-3/10- will con't regimen and monitor if needs more with therapy  7/22- Will change tylenol  to 500 mg BID and con't oxy prn  7/23- hasn't required pain meds overnight- doing better  7/24-27- pain control improved  7/28- pain doing well  except sides burning this AM- but better than usual  7/29-8/1- taking pain meds ~ q6 hours per last 24 hours 4. Mood/Behavior/Sleep: LCSW to follow for evaluation and support when available.              -antipsychotic agents: N/A             -anxiety: continue Atarax  25 mg   7/29- added Zoloft   8/3- Brighter affect 5. Neuropsych/cognition: This patient is capable of making decisions on his own behalf.   6. Complicated wound: WOC nurse consultation             -  Continue wound VAC             Zosyn  d/ced as per surgery recommendation   7/26-7 hydrogel and gauze dressing in place. Midline abd wound looks great 7. 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   -doesn't have much appetite but is trying to eat -ate 100-100-25% yesterday 7/28- still nausea- no change with med changes-   7/31- isn't eating well- Albumin  down to 2.5 from 3.3 2 weeks ago.  He's on Prosource 2x/day and supplements 2x/day with max protein- will d/w pt if drinking them 8. AKI/CKD stage IIIb: Creatinine improving 2.3 now 1.91 -monitor BMP   7/17- Cr 2.05- on IVF's 125/cc/hour- will continue for now and recheck labs in AM             - On continuous IVF NS 125 mL/hr   7/18- Will reduce IVFs to 75cc/hour-     Latest Ref Rng & Units 06/15/2024    4:02 AM 06/13/2024    4:16 AM 06/11/2024    4:17 AM  BMP  Glucose 70 - 99 mg/dL 898  99  94   BUN 8 - 23 mg/dL 36  25  23   Creatinine 0.61 - 1.24 mg/dL 8.58  8.75  8.63   Sodium 135 - 145 mmol/L 133  133  131   Potassium 3.5  - 5.1 mmol/L 3.9  3.8  4.1   Chloride 98 - 111 mmol/L 105  107  105   CO2 22 - 32 mmol/L 18  18  16    Calcium  8.9 - 10.3 mg/dL 89.5  89.7  9.8     2/79- reduce IVF to 50cc/hr Recent ECHO showed normal Ej fx, no concerns or signs of fluid overload at this time   7/22- will stop IVF's- IV is leaking and pt wants to try off it.   7/23- labs in AM  7/24- Cr up to 1.28- from 1.08- will recheck Monday- is up a little because pt not drinking as much, will push fluids still  7/27 BUN/Cr   back up yesterday   -continue IVF tonight.   7/28- BUN/Cr 22 and 1.24  7/31- Pt's BUN is down to 23; but Cr up slightly to 1.36- in spite of IVFs- I'm concerned will go home and get dehydrated- we discussed NEEDS to drink more than 8 cups/day.   8/1-  Will stop IVFs for now, because nursing reports pt has wet sounding lungs- will recheck labs in AM  8/2- Cr better- but BUN slightly up more to 25- will check labs again in AM to monitor if need to go back on IVFs- pt pushing to drink 6-8 cups/day  8/3- labs in AM  8/4 Doesn't appear to have much oral fluid intake although nursing/therapy encouraging, Bun and Cr up to 1.41/36, will start NS 50/hr 9. DM type II: 04/01/24 Hemoglobin A1c 5.3  -Monitor BS AC/at bedtime and use  SSI 7/17- will change to regular diet and try to have pt make good choices- since poor intake- CBG's low 7/18- BG's 84-125- well controlled- con't regimen- probably low due to poor intake.  CBG (last 3)  Recent Labs    06/14/24 2158 06/15/24 0607 06/15/24 1201  GLUCAP 115* 109* 123*    8/3- still good control- con't regimen 8/4 cbgs controlled overall 10.  Severe Hypotension: Order orthostatic vitals              - teds ordered             -increase midodrine  to 10mg  TID   7/17- BP running 80s systolic and feels lightheaded- Midodrine  was increased yesterday to 10 mg TID- if doesn't improve today, will order ACE wraps as well as Thigh high TED's- cannot do abd binder due to abd drains, VAC  and ostomies- if this doesn't improve in next 24 hours, will add Florinef .  Also liberalized his diet to regular diet so can have some salt to help.   7/18- will add Florinef  0.1 mg daily- advised therapy to add ACE wraps- wasn't started yesterday- pt IS symptomatic- will also continue IVFs for now Vitals:   06/15/24 0900 06/15/24 0907  BP:    Pulse: 96 (!) 107  Resp:    Temp:    SpO2:    Overall improved since 7/16 but remains soft cont IVF but at lower rate   7/22- Stopped IVFs since IV is leaking- pt wants ot get rid of IVF's- pt needs to drink 6-8 cups of milk juice or water /day- if labs get worse on Thursday, will restart IVFs. BP's running low 100's systolic- better than before.   7/23- per pt, less [facility]  7/24- still having OH per therapy- will d/w PT/OT before changing meds  7/25- BP 70/s40s standing this AM with ACE wraps and TEDs to thighs- will increase Florinef  to 0.2 mg since has been 1 week since started- will also have everyone push fluids  7/27 sitting and lying readings were almost equal yesterday  7/30- pt's BP dropped to 90s/60s this AM- so will con't TEDs and ACE wraps  7/31- Not dizzy with sitting EOB this AM- does feel a little weak- asked PT to f/u on BP's  8/1- pt didn't AM midodrine - til 10:15- he had difficulty with therapy- increased Midodrine  to 15 mg TID  8/2- went over timing with wife and pt- is correct at 0630, 1030 and 230 pm  8/2- HR running low 100's appeared to get worse around 7/28, however no signs of PE other than tachycardia AND on Eliquis  5mg  BID so very unlikely- probably due to brewing UTI that's improving-   8/4 restart IVF NS as above, continue to encourage oral fluids  11. Stage I Obesity: BMI 31.49 kg/m  -educate on diet and weight loss to promote overall health and mobility.   7/17- BMI down to 28.44 7/22- BMI 28.44 7/28- will ask nursing to check weight 7/31- will ask nursing to add Daily weights- concerned he's losing weight when doesn't need  to right now 8/2- BMI 28.26- keeping pretty stable 12. Hyperlipidemia: Continue Crestor    13. Screening for vitamin D  deficiency:check vitamin D  level tomorrow 14. Hyponatremia  7/17- Na down to 128- but is on IVF's- hard to fluid restrict due to Renal issues, so will keep on IVF's and recheck in AM  7/18- Na up to 134- will not intervene more  7/22- Na up to 136  7/26- Na  135  7/31- Na 131  8/2- Na up to 133  8/4 Na stable 133 15. Poor appetite, nausea- frequent  7/18- will schedule low dose Compazine  before meals TID due to frequent nausea impacting his ability to eat meals. 7/22- still has severe loss of appetite, even though nausea is doing somewhat better- will Add Periacin 4 mg at bedtime- cannot add Remeron due to prolonged QT interval  7/23- will schedule Reglan  5 mg TID-AC to also help with nausea . 7/24- better appetite and nausea this AM- ate 2 bowls cheerios! Making gains 7/25- Will increase Compazine  to 10 mg before meals and changed toming of Reglan  to get 1 hour before meals as well   7/27--ate 100-100-25% yesterday   -he's trying!   -encouraged wife to bring in food from home/outside   -continue periactin   7/28- no improvement in nausea- will d/w team  7/29- spoke with pharmacy- cannot add Remeron- due to prolonged QT interval and already on reglan -   7/30- added Zoloft  yesterday- will monitor  7/31- nausea worse in evening with dinner- Albumin  is dropping- on A lot of supplements- not sure he's taking them- will double with wife. 8/3- ate 1.5 bowls of cheerios this AM  16. UTI- waiting for Urine Cx  8/1-  Has UTI- started Keflex  500 mg BID due to renal impairment-  and waiting for U Cx- reordered, just in case.   8/2- don't see any results so far for Cx  8/3- >100k GNR- still pending  8/4 urine growing Klebsiella pneumonia and Citrobacter freundii, change abx to levofloxacin  250mg  po daily for 5 days     LOS: 19 days A FACE TO FACE EVALUATION WAS  PERFORMED  Murray Collier 06/15/2024, 1:34 PM

## 2024-06-16 ENCOUNTER — Inpatient Hospital Stay (HOSPITAL_COMMUNITY)

## 2024-06-16 DIAGNOSIS — R5381 Other malaise: Secondary | ICD-10-CM | POA: Diagnosis not present

## 2024-06-16 DIAGNOSIS — K631 Perforation of intestine (nontraumatic): Secondary | ICD-10-CM | POA: Diagnosis not present

## 2024-06-16 LAB — GLUCOSE, CAPILLARY
Glucose-Capillary: 106 mg/dL — ABNORMAL HIGH (ref 70–99)
Glucose-Capillary: 131 mg/dL — ABNORMAL HIGH (ref 70–99)
Glucose-Capillary: 101 mg/dL — ABNORMAL HIGH (ref 70–99)
Glucose-Capillary: 124 mg/dL — ABNORMAL HIGH (ref 70–99)

## 2024-06-16 MED ORDER — SERTRALINE HCL 100 MG PO TABS
100.0000 mg | ORAL_TABLET | Freq: Every day | ORAL | Status: DC
  Administered 2024-06-17 – 2024-06-19 (×3): 100 mg via ORAL
  Filled 2024-06-16 (×3): qty 1

## 2024-06-16 NOTE — Progress Notes (Signed)
 Occupational Therapy Session Note  Patient Details  Name: Kristopher Thompson MRN: [id number] Date of Birth: 11-19-1950  Today's Date: 06/16/2024 OT Individual Time: 0915-1030 OT Individual Time Calculation (min): 75 min    Short Term Goals: Week 3:  OT Short Term Goal 1 (Week 3): STG=LTG d/t ELOS  Skilled Therapeutic Interventions/Progress Updates:      Therapy Documentation Precautions:  Precautions Precautions: Fall Recall of Precautions/Restrictions: Intact Precaution/Restrictions Comments: Urostomy/Colostomy, Ab wound vac, B nephrostomy drains Restrictions Weight Bearing Restrictions Per Provider Order: No General:  Pt supine in bed upon OT arrival, agreeable to OT session. Wife emptied urostomy bag at beginning of session. Wife present  Pain: no pain reported  ADL: OT providing skilled intervention with ADL retraining tasks. OT assisting with placing TED hose and ACE wrapping Pt completing supine>EOB with bed features at SBA with increased time. Pt then completing grooming tasks at sink at set up.  Exercises: OT providing skilled intervention with transfer training, activity tolerance, standing endurance and general strengthening. Pt completing multiple trials of W/C><mat table stand step transfers without RW to challenge endurance and balance. Pt completing at CGA-Min from lower surfaces. Pt then challenged to only use 1 UE to push up and hold item in hand for functional aspect. Pt refused d/t not going to carry anything at home. OT then challenging pt at activity for BUTS for increased static standing tolerance. Pt standing at CGA no UE support for 1:28 no rest breaks, pt slightly SOB after, requiring rest break. OT then taking pt back to room dependently in W/C, transferred into bed at [facility] without RW then supine with increased time and use of leg loop with SBA.    Pt supine in bed with direct handoff to wound nsg for wound care. Wife present   Therapy/Group: Individual  Therapy  Camie Hoe, OTD, OTR/L 06/16/2024, 12:34 PM

## 2024-06-16 NOTE — Progress Notes (Addendum)
 Physical Therapy Session Note  Patient Details  Name: Kristopher Thompson MRN: [id number] Date of Birth: 02-16-1951  Today's Date: 06/16/2024 PT Individual Time: 1300-1400 PT Individual Time Calculation (min): 60 min   Short Term Goals: Week 1:  PT Short Term Goal 1 (Week 1): = LTG Week 2:     Skilled Therapeutic Interventions/Progress Updates:    Pt in bed when PT arrived.  Pt alert and willing to participate with PT.  Bed mobility with railings for support supervision with prolonged period of time due to generalized deconditioning. Pt at EOB with UE support, able to donn shoes with set up. Sit->stand to w/c with RW with supervision from lowered bed. Pt presents with T/S kyphosis, FHP with limited cervical extension in upright position vs in w/c. Pt able to lock/unlock w/c, may propel self using UE and LE intermittently in hallways short distances.  PT wheeled pt to day gym x2 with IV pole.  With 3# ankle wts, pt performed obstacle course to step over x4 bars with RW and back to w/c. Pt demonstrates good balance, control and positioning when stepping over object, able to place RW over bars indep properly. Pt very fatigued post activity, HR 123, recovers 99 within 1 minute. Pt wheeled to main gym to perform obstacles with cones and 3 block to step over with RW. Pt, possibly due to fatigue, able to perform, however, slight LOB initially, noted and self correction.  Pt rested in w/c.  Pt amb with RW x25' back to room, rest, amb x80' to room, sat on EOB with supervision. Pt transferred into bed with use of leg lifter, railings.  Due to fatigue post tx, pt required min assist for trunk positioning. Pt able to use LE and push self into middle of bed. Removed ankle wts. Wife presents, all items within reach.   Therapy Documentation Precautions:  Precautions Precautions: Fall Recall of Precautions/Restrictions: Intact Precaution/Restrictions Comments: Urostomy/Colostomy, Ab wound vac, B nephrostomy  drains Restrictions Weight Bearing Restrictions Per Provider Order: No    Pain:    Therapy/Group: Individual Therapy  [name] Fast 06/16/2024, 12:54 PM

## 2024-06-16 NOTE — Consult Note (Signed)
 WOC Nurse ostomy follow up Stoma type/location: RLQ ileal conduit, RUQ ileostomy Stomal assessment/size:  RLQ ileal conduit: oval 1 in x 1 1/4 inch, flush with skin, in valley RUQ ileostomy: 1 1/4 inch oval, abd creases at 3 o'clock and 9 o'clock Peristomal assessment: intact Treatment options for stomal/peristomal skin: 2 inch barrier ring  Output  Ileal conduit: cloudy urine, patient reports being treated for UTI Ileostomy: liquid brown stool Ostomy pouching:  1pc convex urostomy pouch Soila [phone]) with 2 barrier rings Soila # [phone]), 1pc soft convex ileostomy Soila # [phone])   Education provided: Spouse performed pouch change for both sites independently.  Spouse and patient able to empty pouches independently as well.  Report supplies have been delivered to home. Enrolled patient in [place] [employer] Discharge program: Yes  WOC team will follow patient weekly for continued support with ostomy teaching. Bedside RN to manage changing ostomy appliances for the second change of the week.   Thank you,  Doyal Polite, RN, MSN, [facility] WOC Team

## 2024-06-16 NOTE — Progress Notes (Signed)
 Physical Therapy Session Note  Patient Details  Name: Kristopher Thompson MRN: [id number] Date of Birth: April 20, 1951  Today's Date: 06/16/2024 PT Individual Time: 1515-1615 PT Individual Time Calculation (min): 60 min   Short Term Goals: Week 1:  PT Short Term Goal 1 (Week 1): = LTG  Skilled Therapeutic Interventions/Progress Updates:     Pt received supine in bed and agrees to therapy. No complaint of pain. Pt performs supine to sit with cues for sequencing and positioning, as well as use of logrolling technique. Pt completes with much improved efficiency relative to last session with this therapist. Pt performs stand step transfer form bed to [facility] with CGA and cues for positioning. WC transport to gym for time management. Pt transfer to Nustep with cues for sequencing and use of RW. Pt completes Nustep for endurance training. Pt completes x12:00 at workload of 4 with average steps per minute ~50. PT provides cues for hand and foot placement and completing full available ROM. Following, pt transfer from Nustep to [place] without RW and with PT providing CGA/minA and cues for posture and positioning. Pt takes extended seated rest break. Pt then ambulates x100' with RW and close supervision, with cues for posture and increasing stride length and step height for improved safety and balance. Pt requests to return to bed following bout of ambulation due to fatigue. WC transport back to room. Pt performs stand step back to bed with cues for positioning. ModA for sit to supine due to pt's request for BLE management. Pt left supine with all needs within reach. Pt misses 15 minutes of PT due to fatigue.   Therapy Documentation Precautions:  Precautions Precautions: Fall Recall of Precautions/Restrictions: Intact Precaution/Restrictions Comments: Urostomy/Colostomy, Ab wound vac, B nephrostomy drains Restrictions Weight Bearing Restrictions Per Provider Order: No   Therapy/Group: Individual Therapy  Elsie JAYSON Dawn, PT, DPT 06/16/2024, 4:51 PM

## 2024-06-16 NOTE — Progress Notes (Signed)
 Patient awaiting: DG Loopogram today will do tomorrow along with ct - spoke with tina. Notified PA/MD   Geni Armor, LPN

## 2024-06-16 NOTE — Progress Notes (Signed)
 PROGRESS NOTE   Subjective/Complaints: Working with therapy this morning.   Pt reports he drinks until he gets nauseated- trying to drink 6-8 cups/day- but not clear how much he's drinking.   He says he doesn't like the taste of the hospital water - d/w wife to get Mio or Crystal Light.   Saw Dr Sheldon- looking into loopogram Cannot do PICC line for IVFs   ROS:    Pt denies SOB, abd pain, CP, N/V/C/D, and vision changes  Still tachycardic (+) Per HPI     Objective:   No results found.  Recent Labs    06/15/24 0402  WBC 9.0  HGB 12.9*  HCT 39.4  PLT 370    Recent Labs    06/15/24 0402  NA 133*  K 3.9  CL 105  CO2 18*  GLUCOSE 101*  BUN 36*  CREATININE 1.41*  CALCIUM  10.4*    Intake/Output Summary (Last 24 hours) at 06/16/2024 0957 Last data filed at 06/16/2024 0800 Gross per 24 hour  Intake 1648.19 ml  Output 3125 ml  Net -1476.81 ml        Physical Exam: Vital Signs Blood pressure 117/74, pulse 94, temperature 98.5 F (36.9 C), temperature source Oral, resp. rate 17, height 5' 11 (1.803 m), weight 91.8 kg, SpO2 99%.    General: awake, alert, appropriate,  sitting up in bed; wife at bedside; NAD HENT: conjugate gaze; oropharynx moist CV: regular rate and rhythm- rate in 90's; no JVD Pulmonary: CTA B/L; no W/R/R- good air movement GI: soft, NT, ND, (+)BS- has 2 ostomies- emptied- and multiple drains Psychiatric: appropriate-flat affect more than usual Neurological: Ox3   Extremities: No LE edema Skin: Wound on abdomen- abdominal wound with pink granulation tissue, with small amount of loose slough at lower end of wound still with brownish edges- from brownish drainage of wound- more superficial wound-not examined today  Assessment/Plan: 1. Functional deficits which require 3+ hours per day of interdisciplinary therapy in a comprehensive inpatient rehab setting. Physiatrist is providing  close team supervision and 24 hour management of active medical problems listed below. Physiatrist and rehab team continue to assess barriers to discharge/monitor patient progress toward functional and medical goals  Care Tool:  Bathing    Body parts bathed by patient: Right arm, Left arm, Chest, Abdomen, Front perineal area, Right upper leg, Left upper leg, Face, Buttocks, Left lower leg, Right lower leg   Body parts bathed by helper: Buttocks, Right lower leg, Left lower leg     Bathing assist Assist Level: Contact Guard/Touching assist     Upper Body Dressing/Undressing Upper body dressing   What is the patient wearing?: Pull over shirt    Upper body assist Assist Level: Set up assist    Lower Body Dressing/Undressing Lower body dressing      What is the patient wearing?: Pants     Lower body assist Assist for lower body dressing: Contact Guard/Touching assist     Toileting Toileting Toileting Activity did not occur (Clothing management and hygiene only): N/A (no void or bm) (urostomy/ilieostomy)  Toileting assist       Transfers Chair/bed transfer  Transfers assist  Chair/bed transfer assist level: Supervision/Verbal cueing     Locomotion Ambulation   Ambulation assist      Assist level: Contact Guard/Touching assist Assistive device: Other (comment) (Eva walker) Max distance: 135'   Walk 10 feet activity   Assist  Walk 10 feet activity did not occur: Safety/medical concerns (pt too weak, fatigue)  Assist level: Contact Guard/Touching assist Assistive device: Other (comment) (Eva walker)   Walk 50 feet activity   Assist Walk 50 feet with 2 turns activity did not occur: Safety/medical concerns (pt too weak, fatigue)  Assist level: Contact Guard/Touching assist Assistive device: Other (comment) (Eva walker)    Walk 150 feet activity   Assist Walk 150 feet activity did not occur: Safety/medical concerns (pt too weak,  fatigue)  Assist level: Contact Guard/Touching assist Assistive device: Walker-rolling    Walk 10 feet on uneven surface  activity   Assist Walk 10 feet on uneven surfaces activity did not occur: Safety/medical concerns (pt too weak, fatigue)         Wheelchair     Assist Is the patient using a wheelchair?: Yes Type of Wheelchair: Manual    Wheelchair assist level: Supervision/Verbal cueing Max wheelchair distance: 155'    Wheelchair 50 feet with 2 turns activity    Assist    Wheelchair 50 feet with 2 turns activity did not occur:  (pt too weak, fatigue)   Assist Level: Supervision/Verbal cueing   Wheelchair 150 feet activity     Assist  Wheelchair 150 feet activity did not occur:  (pt too weak, fatigue)   Assist Level: Supervision/Verbal cueing   Blood pressure 117/74, pulse 94, temperature 98.5 F (36.9 C), temperature source Oral, resp. rate 17, height 5' 11 (1.803 m), weight 91.8 kg, SpO2 99%.  Medical Problem List and Plan: 1. Functional deficits secondary to debility 2/2 perforated bowel             -patient may shower              -ELOS/Goals: 10-14 days S            D/c 06/19/24  Pt's OH is really limiting therapy as well as nausea Wife to get products ot help leakage from ostomies  Con't CIR PT and OT  Team conference today- to f/u on progress/finalize d/c 2.  Antithrombotics: -DVT/anticoagulation:  Mechanical:  Antiembolism stockings, knee (TED hose) Bilateral lower extremities             -antiplatelet therapy: continue Eliquis  3. Pain Management: continue Scheduled Tylenol ; Oxycodone  as needed took 30mg  Oxy IR yesterday , 20mg  thus far today    7/17- took tylenol  this AM- pain down to 2-3/10- will con't regimen and monitor if needs more with therapy  7/22- Will change tylenol  to 500 mg BID and con't oxy prn  7/23- hasn't required pain meds overnight- doing better  7/24-27- pain control improved  7/28- pain doing well except sides  burning this AM- but better than usual  7/29-8/1- taking pain meds ~ q6 hours per last 24 hours  8/5- not taking pain meds- wants to go home with 1 week/20 pills at d/c 4. Mood/Behavior/Sleep: LCSW to follow for evaluation and support when available.              -antipsychotic agents: N/A             -anxiety: continue Atarax  25 mg   7/29- added Zoloft   8/3- Brighter affect  8/5- more flat today- will increase  Zoloft   5. Neuropsych/cognition: This patient is capable of making decisions on his own behalf.   6. Complicated wound: WOC nurse consultation             - Continue wound VAC             Zosyn  d/ced as per surgery recommendation   7/26-7 hydrogel and gauze dressing in place. Midline abd wound looks great 7. Fluids/Electrolytes/Nutrition: Monitor I's & O's--Recheck monitor labs routinely CBC/CMP              - Bowel regimen for ileostomy: Continue FiberCon and loperamide  twice daily Goal is [phone] mL out fecal ileostomy a day              - Carb/cardiac diet -continue Juven/Prosource/Ensure supplements              -Nausea/vomiting:  seems to have improved- PRN orders Reglan  and Zofran  7/24- made Reglan  scheduled 5 mg TID-AC- and nausea is somewhat better this AM 7/26-27  nausea seems a bit better.   -doesn't have much appetite but is trying to eat -ate 100-100-25% yesterday 7/28- still nausea- no change with med changes-   7/31- isn't eating well- Albumin  down to 2.5 from 3.3 2 weeks ago.  He's on Prosource 2x/day and supplements 2x/day with max protein- will d/w pt if drinking them 8. AKI/CKD stage IIIb: Creatinine improving 2.3 now 1.91 -monitor BMP   7/17- Cr 2.05- on IVF's 125/cc/hour- will continue for now and recheck labs in AM             - On continuous IVF NS 125 mL/hr   7/18- Will reduce IVFs to 75cc/hour-     Latest Ref Rng & Units 06/15/2024    4:02 AM 06/13/2024    4:16 AM 06/11/2024    4:17 AM  BMP  Glucose 70 - 99 mg/dL 898  99  94   BUN 8 - 23 mg/dL 36  25  23    Creatinine 0.61 - 1.24 mg/dL 8.58  8.75  8.63   Sodium 135 - 145 mmol/L 133  133  131   Potassium 3.5 - 5.1 mmol/L 3.9  3.8  4.1   Chloride 98 - 111 mmol/L 105  107  105   CO2 22 - 32 mmol/L 18  18  16    Calcium  8.9 - 10.3 mg/dL 89.5  89.7  9.8     2/79- reduce IVF to 50cc/hr Recent ECHO showed normal Ej fx, no concerns or signs of fluid overload at this time   7/22- will stop IVF's- IV is leaking and pt wants to try off it.   7/23- labs in AM  7/24- Cr up to 1.28- from 1.08- will recheck Monday- is up a little because pt not drinking as much, will push fluids still  7/27 BUN/Cr   back up yesterday   -continue IVF tonight.   7/28- BUN/Cr 22 and 1.24  7/31- Pt's BUN is down to 23; but Cr up slightly to 1.36- in spite of IVFs- I'm concerned will go home and get dehydrated- we discussed NEEDS to drink more than 8 cups/day.   8/1-  Will stop IVFs for now, because nursing reports pt has wet sounding lungs- will recheck labs in AM  8/2- Cr better- but BUN slightly up more to 25- will check labs again in AM to monitor if need to go back on IVFs- pt pushing to drink 6-8 cups/day  8/3- labs in AM  8/4  Doesn't appear to have much oral fluid intake although nursing/therapy encouraging, Bun and Cr up to 1.41/36, will start NS 50/hr  8/5- cannot go home on PICC with IVF's- not allowed by insurance and medically too high risk to get PICC when can drink 9. DM type II: 04/01/24 Hemoglobin A1c 5.3  -Monitor BS AC/at bedtime and use SSI 7/17- will change to regular diet and try to have pt make good choices- since poor intake- CBG's low 7/18- BG's 84-125- well controlled- con't regimen- probably low due to poor intake.  CBG (last 3)  Recent Labs    06/15/24 1634 06/15/24 2110 06/16/24 0625  GLUCAP 114* 121* 106*    8/3- still good control- con't regimen 8/4 cbgs controlled overall 10.  Severe Hypotension: Order orthostatic vitals              - teds ordered             -increase midodrine  to 10mg   TID   7/17- BP running 80s systolic and feels lightheaded- Midodrine  was increased yesterday to 10 mg TID- if doesn't improve today, will order ACE wraps as well as Thigh high TED's- cannot do abd binder due to abd drains, VAC and ostomies- if this doesn't improve in next 24 hours, will add Florinef .  Also liberalized his diet to regular diet so can have some salt to help.   7/18- will add Florinef  0.1 mg daily- advised therapy to add ACE wraps- wasn't started yesterday- pt IS symptomatic- will also continue IVFs for now Vitals:   06/15/24 2052 06/16/24 0627  BP: 99/60 117/74  Pulse: 89 94  Resp: 17 17  Temp: 98.5 F (36.9 C) 98.5 F (36.9 C)  SpO2: 98% 99%  Overall improved since 7/16 but remains soft cont IVF but at lower rate   7/22- Stopped IVFs since IV is leaking- pt wants ot get rid of IVF's- pt needs to drink 6-8 cups of milk juice or water /day- if labs get worse on Thursday, will restart IVFs. BP's running low 100's systolic- better than before.   7/23- per pt, less [facility]  7/24- still having OH per therapy- will d/w PT/OT before changing meds  7/25- BP 70/s40s standing this AM with ACE wraps and TEDs to thighs- will increase Florinef  to 0.2 mg since has been 1 week since started- will also have everyone push fluids  7/27 sitting and lying readings were almost equal yesterday  7/30- pt's BP dropped to 90s/60s this AM- so will con't TEDs and ACE wraps  7/31- Not dizzy with sitting EOB this AM- does feel a little weak- asked PT to f/u on BP's  8/1- pt didn't AM midodrine - til 10:15- he had difficulty with therapy- increased Midodrine  to 15 mg TID  8/2- went over timing with wife and pt- is correct at 0630, 1030 and 230 pm  8/2- HR running low 100's appeared to get worse around 7/28, however no signs of PE other than tachycardia AND on Eliquis  5mg  BID so very unlikely- probably due to brewing UTI that's improving-   8/4 restart IVF NS as above, continue to encourage oral fluids   8/5- Was  less orthostatic yesterday 11. Stage I Obesity: BMI 31.49 kg/m  -educate on diet and weight loss to promote overall health and mobility.   7/17- BMI down to 28.44 7/22- BMI 28.44 7/28- will ask nursing to check weight 7/31- will ask nursing to add Daily weights- concerned he's losing weight when doesn't need to right  now 8/2- BMI 28.26- keeping pretty stable 12. Hyperlipidemia: Continue Crestor    13. Screening for vitamin D  deficiency:check vitamin D  level tomorrow 14. Hyponatremia  7/17- Na down to 128- but is on IVF's- hard to fluid restrict due to Renal issues, so will keep on IVF's and recheck in AM  7/18- Na up to 134- will not intervene more  7/22- Na up to 136  7/26- Na 135  7/31- Na 131  8/2- Na up to 133  8/4 Na stable 133 15. Poor appetite, nausea- frequent  7/18- will schedule low dose Compazine  before meals TID due to frequent nausea impacting his ability to eat meals. 7/22- still has severe loss of appetite, even though nausea is doing somewhat better- will Add Periacin 4 mg at bedtime- cannot add Remeron due to prolonged QT interval  7/23- will schedule Reglan  5 mg TID-AC to also help with nausea . 7/24- better appetite and nausea this AM- ate 2 bowls cheerios! Making gains 7/25- Will increase Compazine  to 10 mg before meals and changed toming of Reglan  to get 1 hour before meals as well   7/27--ate 100-100-25% yesterday   -he's trying!   -encouraged wife to bring in food from home/outside   -continue periactin   7/28- no improvement in nausea- will d/w team  7/29- spoke with pharmacy- cannot add Remeron- due to prolonged QT interval and already on reglan -   7/30- added Zoloft  yesterday- will monitor  7/31- nausea worse in evening with dinner- Albumin  is dropping- on A lot of supplements- not sure he's taking them- will double with wife. 8/3- ate 1.5 bowls of cheerios this AM  8/5- pt up 8 lbs in 1 day- will double check with nursing.  16. UTI- waiting for Urine  Cx  8/1-  Has UTI- started Keflex  500 mg BID due to renal impairment-  and waiting for U Cx- reordered, just in case.   8/2- don't see any results so far for Cx  8/3- >100k GNR- still pending  8/4 urine growing Klebsiella pneumonia and Citrobacter freundii, change abx to levofloxacin  250mg  po daily for 5 days   I spent a total of 54   minutes on total care today- >50% coordination of care- due to  D/w other MD prolonged about IVF's as well as NP- and nursing and pt- d/w wife Mio or Crystal light and to measure input- so we can improve hydration- cannot go home on PICC  LOS: 20 days A FACE TO FACE EVALUATION WAS PERFORMED  Alahna Dunne 06/16/2024, 9:57 AM

## 2024-06-16 NOTE — Progress Notes (Signed)
 Patient ID: Kristopher Thompson, male   DOB: 07/25/1951, 73 y.o.   MRN: [id number] Met with pt to give team conference update progress this week and plan for discharge on Friday 8/8. Wife was downstairs getting lunch. Pt feels he will be ready and hopes to stay at home once he gets there. Wife has done the eduction with his incision, drains and urostomy tubes. Pt has all needed equipment from past admissions. Pt aware has a UTI and MD is treating it. Will stop back when wife returns to make sure no questions or concerns.

## 2024-06-16 NOTE — Plan of Care (Signed)
  Problem: Consults Goal: RH GENERAL PATIENT EDUCATION Description: See Patient Education module for education specifics. Outcome: Progressing   Problem: RH BOWEL ELIMINATION Goal: RH STG MANAGE BOWEL WITH ASSISTANCE Description: STG Manage Bowel with minimal assistance Assistance. Outcome: Progressing   Problem: RH BLADDER ELIMINATION Goal: RH STG MANAGE BLADDER WITH ASSISTANCE Description: STG Manage Bladder With minimal  Assistance Outcome: Progressing   Problem: RH SKIN INTEGRITY Goal: RH STG SKIN FREE OF INFECTION/BREAKDOWN Description: Manage skin free of infection/breakdown with minimal assistance Outcome: Progressing   Problem: RH SAFETY Goal: RH STG ADHERE TO SAFETY PRECAUTIONS W/ASSISTANCE/DEVICE Description: STG Adhere to Safety Precautions With minimal assistance Assistance/Device. Outcome: Progressing   Problem: RH PAIN MANAGEMENT Goal: RH STG PAIN MANAGED AT OR BELOW PT'S PAIN GOAL Description: <4 w/ prns Outcome: Progressing   Problem: RH KNOWLEDGE DEFICIT GENERAL Goal: RH STG INCREASE KNOWLEDGE OF SELF CARE AFTER HOSPITALIZATION Description: Manage increase knowledge of self care after hospitalization with minimal assistance from spouse using educational materials provided Outcome: Progressing

## 2024-06-16 NOTE — Patient Care Conference (Signed)
 Inpatient RehabilitationTeam Conference and Plan of Care Update Date: 06/16/2024   Time: 1149 am    Patient Name: Kristopher Thompson      Medical Record Number: [id number]  Date of Birth: September 10, 1951 Sex: Male         Room/Bed: 4W23C/4W23C-01 Payor Info: Payor: MEDICARE / Plan: MEDICARE PART A AND B / Product Type: *No Product type* /    Admit Date/Time:  05/27/2024  3:04 PM  Primary Diagnosis:  Debility  Hospital Problems: Principal Problem:   Debility Active Problems:   S/P ileal conduit (HCC)   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 08/08/2017   GERD (gastroesophageal reflux disease)   Obesity (BMI 35.0-39.9 without comorbidity)   Irritant contact dermatitis associated with fecal stoma   Chronic anticoagulation   History of bladder cancer   Obstructive sleep apnea of adult   Personal history of PE (pulmonary embolism)   Delayed bowel perforation s/p SBR/end ileostomy   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 Duwaine Barrs Social Worker Present: Rhoda Clement, LCSW Nurse Present: Eulalio Falls, RN PT Present: Other (comment) Lessie Miso, PT) OT Present: Camie Hoe, OT     Current Status/Progress Goal Weekly Team Focus  Bowel/Bladder   Pt has ileostomy, urostomy, and bilateral nephrostomy tubes   Pt will be free from infection   meds as prescribed, empty drains PRN    Swallow/Nutrition/ Hydration               ADL's   SBA overall with AE as necessary   SBA overall   D/C planning    Mobility   Supervision for transfers, bed mobility min A for LE placement, gait CGA/SBA, moderate fatigue, increased HR, ortho static hypotension  monitoring   supervision for gait with RW, mod indep for transfers with AD  d/c planning, endurance tx, HEP, fall prevention, posture re-ed, strengthening    Communication                Safety/Cognition/ Behavioral Observations               Pain   C/O mild to moderate back pain   Maintain pain score <3   Assess for pain Q shift and PRN    Skin   Surgical incisions throughout abdomen. Stage 2 on sacrum   Pts incision sites will remain free from infection  Assess skin Q shift and PRN      Discharge Planning:  Wife is here daily and participating in husband's therapies. Awaiting equipment needs and HH set up via [facility]   Team Discussion: Patient was admitted post debility due to perforated bowels. Patient with bilateral nephrostomy tubes, ileostomy, urostomy tube and midline incision. Patient with hypotension, LUP:[id number] adjusted by MD.  Patient progress limited by right sided weakness, poor appetite/ activity tolerance/ endurance and self limiting behaviors.    Patient on target to meet rehab goals: Currently patient needs supervision assistance with ADLs. Patient needs supervision with transfers and mod I assistance with AD. Patient ambulates using a rolling walker with supervision assistance. Overall goals at discharge are set for SBA.   *See Care Plan and progress notes for long and short-term  goals.   Revisions to Treatment Plan:  Encourage increase in fluid intake   Teaching Needs: Safety, medications, incision care, train wife in management of ileostomy, urostomy and nephrostomy tubes, transfers, ace wrap education, etc.   Current Barriers to Discharge: Decreased caregiver support, Home enviroment access/layout, and Wound care  Possible Resolutions to Barriers: Family Education Home health follow-up HEP     Medical Summary Current Status: has abds wound and a lot of drains and ostomies- wife trained; has UTI;  Barriers to Discharge:  Behavior/Mood;Complicated Wound;Inadequate Nutritional Intake;Hypotension;Morbid Obesity;Self-care education;Other (comments);Renal Insufficiency/Failure;Weight bearing restrictions;Electrolyte abnormality;Medical stability  Barriers to Discharge Comments: ileostomy and Urostomy and drains; wife is trained in ostomies; depression- needs to drink more, orthostaitc hypotension- on Midodrine  15 mg TID- and Florinef , self limited, poor endurance Possible Resolutions to [employer] Focus: needs to drink more- d/w pt- not taking pain meds;  d/c 8/8- will meet goals before d/c   Continued Need for Acute Rehabilitation Level of Care: The patient requires daily medical management by a physician with specialized training in physical medicine and rehabilitation for the following reasons: Direction of a multidisciplinary physical rehabilitation program to maximize functional independence : Yes Medical management of patient stability for increased activity during participation in an intensive rehabilitation regime.: Yes Analysis of laboratory values and/or radiology reports with any subsequent need for medication adjustment and/or medical intervention. : Yes   I attest that I was present, lead the team conference, and concur with the assessment and plan of the team.   Kristopher Thompson 06/16/2024, 1149 am

## 2024-06-17 ENCOUNTER — Inpatient Hospital Stay (HOSPITAL_COMMUNITY)

## 2024-06-17 ENCOUNTER — Encounter (HOSPITAL_COMMUNITY): Payer: Self-pay | Admitting: Physical Medicine and Rehabilitation

## 2024-06-17 DIAGNOSIS — R5381 Other malaise: Secondary | ICD-10-CM | POA: Diagnosis not present

## 2024-06-17 DIAGNOSIS — K631 Perforation of intestine (nontraumatic): Secondary | ICD-10-CM | POA: Diagnosis not present

## 2024-06-17 LAB — GLUCOSE, CAPILLARY
Glucose-Capillary: 93 mg/dL (ref 70–99)
Glucose-Capillary: 145 mg/dL — ABNORMAL HIGH (ref 70–99)
Glucose-Capillary: 106 mg/dL — ABNORMAL HIGH (ref 70–99)
Glucose-Capillary: 129 mg/dL — ABNORMAL HIGH (ref 70–99)

## 2024-06-17 MED ORDER — IOHEXOL 300 MG/ML  SOLN
60.0000 mL | Freq: Once | INTRAMUSCULAR | Status: AC | PRN
  Administered 2024-06-17: 60 mL

## 2024-06-17 MED ORDER — IOHEXOL 350 MG/ML SOLN
75.0000 mL | Freq: Once | INTRAVENOUS | Status: AC | PRN
  Administered 2024-06-17: 75 mL via INTRAVENOUS

## 2024-06-17 NOTE — Progress Notes (Signed)
 PROGRESS NOTE   Subjective/Complaints:   Waiting for loopogram to be done- per Dr Sheldon- if no more ileal conduit urine leak at urostomy, then can cap B/L nephrostomy tubes and will have IR cap immediately if possible.   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  Unclear what the plan is- I'm not sure will be discharging unless we figure out what the plan is- this result came back late today.   Of note, nausea overall is getting better.    Wife changing ostomies and cleaning abd this AM.  ROS:    Pt denies SOB, abd pain, CP, N/V/C/D, and vision changes   Still tachycardic (+) Per HPI  CT abd/pelvis 06/17/24-  IMPRESSION: 1. 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 2. Enlarging fluid collection along the right ventral pelvic abdominal wall with a percutaneous drain in place, indicative of an abscess. 3. 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 4. Right lower quadrant ileostomy. 5. Probable small right renal stone. 6. Chronic calcific pancreatitis.    Objective:   DG Loopogram Result Date: 06/17/2024 INDICATION: Patient with a history of a radical cystectomy/ileal conduit urinary diversion with a known leak of the ileal conduit with bilateral nephrostomy tubes placed 05/04/24 as well as placement of a right retrograde ureteral stent 05/14/24. Request for loopogram to assess for further evidence of urinary leak. COMPARISON:  DG LOOPOGRAM 05/12/24 CONTRAST:  A total of 60 mL Isovue -300 administered was administered into the bilateral nephrostomy tubes as well as retrograde ureteral stent. FLUOROSCOPY TIME:  21 mGy COMPLICATIONS: None immediate. TECHNIQUE: Informed written consent was obtained  from the patient after a discussion of the risks, benefits and alternatives to treatment. Questions regarding the procedure were encouraged and answered. A timeout was performed prior to the initiation of the procedure. A pre procedural spot fluoroscopic image was obtained. The bilateral flank and external portions of existing nephrostomy catheters and right ureteral stent were prepped and sterilized. Antegrade injection of 20 mL of contrast into the existing right nephrostomy catheter demonstrated appropriate positioning within the renal pelvis. After several minutes, no contrast was identified within the right ureter or ileal conduit. Antegrade injection of 20 mL of contrast into the existing left nephrostomy catheter demonstrated appropriate positioning within the renal pelvis. After several minutes, a small volume of contrast was noted to flow through the left ureter into the ileal conduit. Retrograde injection of the right ureteral stent opacified the right ureter and right renal collecting system. No contrast extravasation observed. Temporary dressing applied to the ileal conduit ostomy site. Bilateral nephrostomy tubes were capped as requested by ordering MD. The patient tolerated the procedure well without immediate postprocedural complication. FINDINGS: The existing right nephrostomy catheter is appropriately positioned and functioning. Right retrograde ureteral stent in place through ileal conduit. IMPRESSION: 1. Right nephrostogram via the right nephrostomy tube and right ureteral stent demonstrates normally patent ureter and ureteral anastomosis with the ileal conduit. No right-sided urine leak demonstrated. 2. Left nephrostogram through left  nephrostomy tube appears in good position with evidence of contrast flow to the distal ureter. No left-sided urine leak demonstrated. Performed by: Kacie Matthews PA-C Electronically Signed   By: Ester Sides M.D.   On: 06/17/2024 16:00   CT ABDOMEN PELVIS W WO  CONTRAST Result Date: 06/17/2024 CLINICAL DATA:  Abdominal pain, postop, ileal conduit leak. EXAM: CT ABDOMEN AND PELVIS WITHOUT AND WITH CONTRAST TECHNIQUE: Multidetector CT imaging of the abdomen and pelvis was performed following the standard protocol before and following the bolus administration of intravenous contrast. RADIATION DOSE REDUCTION: This exam was performed according to the departmental dose-optimization program which includes automated exposure control, adjustment of the mA and/or kV according to patient size and/or use of iterative reconstruction technique. CONTRAST:  75mL OMNIPAQUE  IOHEXOL  350 MG/ML SOLN COMPARISON:  05/12/2024. FINDINGS: Lower chest: A few tiny pulmonary nodules are unchanged from 08/29/2018 and are considered benign. Heart size normal. No pericardial effusion. No pleural effusion. Distal esophagus is grossly unremarkable. Hepatobiliary: Liver is grossly unremarkable. Cholecystectomy. No biliary ductal dilatation. Pancreas: Chronic calcific pancreatitis. 1.3 cm low-attenuation lesion in the pancreatic tail (3/21), unchanged from 08/29/2018, compatible with a pseudocyst. Spleen: Negative. Adrenals/Urinary Tract: Negative. Stomach/Bowel: Bilateral percutaneous nephrostomies with contrast in the collecting systems bilaterally, secondary to a loopogram performed prior to this study. Possible small stone in the right kidney (3/24). Persistent moderate to severe dilatation of the right intrarenal collecting system and right ureter with a double-J right ureteral stent in place, proximal loop in the right renal pelvis and distal portion the on the right lower quadrant ileal conduit. Left kidney is atrophic and scarred. Partial opacification of the left ureter which is decompressed. Ileal conduit in the right lower quadrant which may be stenotic as it traverses the abdominal wall, as on 05/12/2024. No extraluminal contrast. Cysto prostatectomy. Vascular/Lymphatic: Stomach is unremarkable.  Right lower quadrant ileostomy and separate ileal conduit. Small bowel and colon are otherwise unremarkable. Appendix is not readily visualized. Reproductive: Prostatectomy. Other: Small right inguinal hernia contains fat. Enlarging broad fluid collection along the right ventral pelvic wall, just above the ileal conduit, measuring 3.4 x 21.6 cm. A percutaneous drain is seen within. No free fluid or extraluminal contrast. Musculoskeletal: Degenerative changes in the spine. Old L1 compression fracture. IMPRESSION: 1. 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 2. Enlarging fluid collection along the right ventral pelvic abdominal wall with a percutaneous drain in place, indicative of an abscess. 3. 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 4. Right lower quadrant ileostomy. 5. Probable small right renal stone. 6. Chronic calcific pancreatitis. Electronically Signed   By: Newell Eke M.D.   On: 06/17/2024 15:47    Recent Labs    06/15/24 0402  WBC 9.0  HGB 12.9*  HCT 39.4  PLT 370    Recent Labs    06/15/24 0402  NA 133*  K 3.9  CL 105  CO2 18*  GLUCOSE 101*  BUN 36*  CREATININE 1.41*  CALCIUM  10.4*    Intake/Output Summary (Last 24 hours) at 06/17/2024 1917 Last data filed at 06/17/2024 1520 Gross per 24 hour  Intake 1298.49 ml  Output 1430 ml  Net -131.51 ml        Physical Exam: Vital Signs Blood pressure 106/72, pulse 89, temperature 98.5 F (36.9 C), temperature source Oral, resp. rate 16, height 5' 11 (1.803 m), weight 90.3 kg, SpO2 100%.  General: awake, alert, appropriate, sitting up in bed; wife changing ostomies; nursing also in room; NAD HENT: conjugate gaze; oropharynx moist CV: regular rate and  rhythm- rate in 80's; no JVD Pulmonary: CTA B/L; no W/R/R- good air movement GI: soft, NT, ND, (+)BS- see skin below Psychiatric:  appropriate- more flat affect this AM=- but also wasn't wearing hearing aids- very HOH Neurological: Ox3 Skin- wife changing ostomies- look great- and abd wound more superficial on exam   Extremities: No LE edema B/L    Assessment/Plan: 1. Functional deficits which require 3+ hours per day of interdisciplinary therapy in a comprehensive inpatient rehab setting. Physiatrist is providing close team supervision and 24 hour management of active medical problems listed below. Physiatrist and rehab team continue to assess barriers to discharge/monitor patient progress toward functional and medical goals  Care Tool:  Bathing    Body parts bathed by patient: Right arm, Left arm, Chest, Abdomen, Front perineal area, Right upper leg, Left upper leg, Face, Buttocks, Left lower leg, Right lower leg   Body parts bathed by helper: Buttocks, Right lower leg, Left lower leg     Bathing assist Assist Level: Contact Guard/Touching assist     Upper Body Dressing/Undressing Upper body dressing   What is the patient wearing?: Pull over shirt    Upper body assist Assist Level: Set up assist    Lower Body Dressing/Undressing Lower body dressing      What is the patient wearing?: Pants     Lower body assist Assist for lower body dressing: Contact Guard/Touching assist     Toileting Toileting Toileting Activity did not occur (Clothing management and hygiene only): N/A (no void or bm) (urostomy/ilieostomy)  Toileting assist       Transfers Chair/bed transfer  Transfers assist     Chair/bed transfer assist level: Supervision/Verbal cueing     Locomotion Ambulation   Ambulation assist      Assist level: 2 helpers Assistive device: Walker-rolling Max distance: 105'   Walk 10 feet activity   Assist  Walk 10 feet activity did not occur: Safety/medical concerns (pt too weak, fatigue)  Assist level: Contact Guard/Touching assist Assistive device: Walker-rolling   Walk 50  feet activity   Assist Walk 50 feet with 2 turns activity did not occur: Safety/medical concerns (pt too weak, fatigue)  Assist level: Contact Guard/Touching assist Assistive device: Walker-rolling    Walk 150 feet activity   Assist Walk 150 feet activity did not occur: Safety/medical concerns (pt too weak, fatigue)  Assist level: Contact Guard/Touching assist Assistive device: Walker-rolling    Walk 10 feet on uneven surface  activity   Assist Walk 10 feet on uneven surfaces activity did not occur: Safety/medical concerns (pt too weak, fatigue)         Wheelchair     Assist Is the patient using a wheelchair?: Yes Type of Wheelchair: Manual    Wheelchair assist level: Supervision/Verbal cueing Max wheelchair distance: 155'    Wheelchair 50 feet with 2 turns activity    Assist    Wheelchair 50 feet with 2 turns activity did not occur:  (pt too weak, fatigue)   Assist Level: Supervision/Verbal cueing   Wheelchair 150 feet activity     Assist  Wheelchair 150 feet activity did not occur:  (pt too weak, fatigue)   Assist Level: Supervision/Verbal cueing   Blood pressure 106/72, pulse 89, temperature 98.5 F (36.9 C), temperature source Oral, resp. rate 16, height 5' 11 (1.803 m), weight 90.3 kg,  SpO2 100%.  Medical Problem List and Plan: 1. Functional deficits secondary to debility 2/2 perforated bowel             -patient may shower              -ELOS/Goals: 10-14 days S            D/c 06/19/24  Pt's OH is really limiting therapy as well as nausea Wife to get products ot help leakage from ostomies  Con't CIR PT and OT  Just found a CT showing R hydronephrosis and abscess on ventral abd wall- will d/w Dr Sheldon tomorrow- might need to call Urology as well.  Not sure if will delay d/c or not 2.  Antithrombotics: -DVT/anticoagulation:  Mechanical:  Antiembolism stockings, knee (TED hose) Bilateral lower extremities             -antiplatelet therapy:  continue Eliquis  3. Pain Management: continue Scheduled Tylenol ; Oxycodone  as needed took 30mg  Oxy IR yesterday , 20mg  thus far today    7/17- took tylenol  this AM- pain down to 2-3/10- will con't regimen and monitor if needs more with therapy  7/22- Will change tylenol  to 500 mg BID and con't oxy prn  7/23- hasn't required pain meds overnight- doing better  7/24-27- pain control improved  7/28- pain doing well except sides burning this AM- but better than usual  7/29-8/1- taking pain meds ~ q6 hours per last 24 hours  8/5- not taking pain meds- wants to go home with 1 week/20 pills at d/c- which I agree with 4. Mood/Behavior/Sleep: LCSW to follow for evaluation and support when available.              -antipsychotic agents: N/A             -anxiety: continue Atarax  25 mg   7/29- added Zoloft   8/3- Brighter affect  8/5- more flat today- will increase Zoloft   5. Neuropsych/cognition: This patient is capable of making decisions on his own behalf.   6. Complicated wound: WOC nurse consultation             - Continue wound VAC             Zosyn  d/ced as per surgery recommendation   7/26-7 hydrogel and gauze dressing in place. Midline abd wound looks great 7. Fluids/Electrolytes/Nutrition: Monitor I's & O's--Recheck monitor labs routinely CBC/CMP              - Bowel regimen for ileostomy: Continue FiberCon and loperamide  twice daily Goal is [phone] mL out fecal ileostomy a day              - Carb/cardiac diet -continue Juven/Prosource/Ensure supplements              -Nausea/vomiting:  seems to have improved- PRN orders Reglan  and Zofran  7/24- made Reglan  scheduled 5 mg TID-AC- and nausea is somewhat better this AM 7/26-27  nausea seems a bit better.   -doesn't have much appetite but is trying to eat -ate 100-100-25% yesterday 7/28- still nausea- no change with med changes-   7/31- isn't eating well- Albumin  down to 2.5 from 3.3 2 weeks ago.  He's on Prosource 2x/day and supplements 2x/day  with max protein- will d/w pt if drinking them 8. AKI/CKD stage IIIb: Creatinine improving 2.3 now 1.91 -monitor BMP   7/17- Cr 2.05- on IVF's 125/cc/hour- will continue for now and recheck labs in AM             -  On continuous IVF NS 125 mL/hr   7/18- Will reduce IVFs to 75cc/hour-     Latest Ref Rng & Units 06/15/2024    4:02 AM 06/13/2024    4:16 AM 06/11/2024    4:17 AM  BMP  Glucose 70 - 99 mg/dL 898  99  94   BUN 8 - 23 mg/dL 36  25  23   Creatinine 0.61 - 1.24 mg/dL 8.58  8.75  8.63   Sodium 135 - 145 mmol/L 133  133  131   Potassium 3.5 - 5.1 mmol/L 3.9  3.8  4.1   Chloride 98 - 111 mmol/L 105  107  105   CO2 22 - 32 mmol/L 18  18  16    Calcium  8.9 - 10.3 mg/dL 89.5  89.7  9.8     2/79- reduce IVF to 50cc/hr Recent ECHO showed normal Ej fx, no concerns or signs of fluid overload at this time   7/22- will stop IVF's- IV is leaking and pt wants to try off it.   7/23- labs in AM  7/24- Cr up to 1.28- from 1.08- will recheck Monday- is up a little because pt not drinking as much, will push fluids still  7/27 BUN/Cr   back up yesterday   -continue IVF tonight.   7/28- BUN/Cr 22 and 1.24  7/31- Pt's BUN is down to 23; but Cr up slightly to 1.36- in spite of IVFs- I'm concerned will go home and get dehydrated- we discussed NEEDS to drink more than 8 cups/day.   8/1-  Will stop IVFs for now, because nursing reports pt has wet sounding lungs- will recheck labs in AM  8/2- Cr better- but BUN slightly up more to 25- will check labs again in AM to monitor if need to go back on IVFs- pt pushing to drink 6-8 cups/day  8/3- labs in AM  8/4 Doesn't appear to have much oral fluid intake although nursing/therapy encouraging, Bun and Cr up to 1.41/36, will start NS 50/hr  8/5- cannot go home on PICC with IVF's- not allowed by insurance and medically too high risk to get PICC when can drink  8/6- will try again to stop IVFs- and will check labs in AM 9. DM type II: 04/01/24 Hemoglobin A1c 5.3   -Monitor BS AC/at bedtime and use SSI 7/17- will change to regular diet and try to have pt make good choices- since poor intake- CBG's low 7/18- BG's 84-125- well controlled- con't regimen- probably low due to poor intake.  CBG (last 3)  Recent Labs    06/17/24 0555 06/17/24 1213 06/17/24 1706  GLUCAP 93 145* 106*    8/3- still good control- con't regimen 8/6 cbgs controlled overall- look good 10.  Severe Hypotension: Order orthostatic vitals              - teds ordered             -increase midodrine  to 10mg  TID   7/17- BP running 80s systolic and feels lightheaded- Midodrine  was increased yesterday to 10 mg TID- if doesn't improve today, will order ACE wraps as well as Thigh high TED's- cannot do abd binder due to abd drains, VAC and ostomies- if this doesn't improve in next 24 hours, will add Florinef .  Also liberalized his diet to regular diet so can have some salt to help.   7/18- will add Florinef  0.1 mg daily- advised therapy to add ACE wraps- wasn't started yesterday- pt IS symptomatic-  will also continue IVFs for now Vitals:   06/17/24 0545 06/17/24 1316  BP: 119/67 106/72  Pulse: 97 89  Resp: 18 16  Temp: 98.5 F (36.9 C) 98.5 F (36.9 C)  SpO2: 97% 100%  Overall improved since 7/16 but remains soft cont IVF but at lower rate   7/22- Stopped IVFs since IV is leaking- pt wants ot get rid of IVF's- pt needs to drink 6-8 cups of milk juice or water /day- if labs get worse on Thursday, will restart IVFs. BP's running low 100's systolic- better than before.   7/23- per pt, less [facility]  7/24- still having OH per therapy- will d/w PT/OT before changing meds  7/25- BP 70/s40s standing this AM with ACE wraps and TEDs to thighs- will increase Florinef  to 0.2 mg since has been 1 week since started- will also have everyone push fluids  7/27 sitting and lying readings were almost equal yesterday  7/30- pt's BP dropped to 90s/60s this AM- so will con't TEDs and ACE wraps  7/31- Not dizzy  with sitting EOB this AM- does feel a little weak- asked PT to f/u on BP's  8/1- pt didn't AM midodrine - til 10:15- he had difficulty with therapy- increased Midodrine  to 15 mg TID  8/2- went over timing with wife and pt- is correct at 0630, 1030 and 230 pm  8/2- HR running low 100's appeared to get worse around 7/28, however no signs of PE other than tachycardia AND on Eliquis  5mg  BID so very unlikely- probably due to brewing UTI that's improving-   8/4 restart IVF NS as above, continue to encourage oral fluids   8/5- Was less orthostatic yesterday  8/6- no tachycardia at rest this AM- getting better 11. Stage I Obesity: BMI 31.49 kg/m  -educate on diet and weight loss to promote overall health and mobility.   7/17- BMI down to 28.44 7/22- BMI 28.44 7/28- will ask nursing to check weight 7/31- will ask nursing to add Daily weights- concerned he's losing weight when doesn't need to right now 8/2- BMI 28.26- keeping pretty stable 12. Hyperlipidemia: Continue Crestor    13. Screening for vitamin D  deficiency:check vitamin D  level tomorrow 14. Hyponatremia  7/17- Na down to 128- but is on IVF's- hard to fluid restrict due to Renal issues, so will keep on IVF's and recheck in AM  7/18- Na up to 134- will not intervene more  7/22- Na up to 136  7/26- Na 135  7/31- Na 131  8/2- Na up to 133  8/4 Na stable 133 15. Poor appetite, nausea- frequent  7/18- will schedule low dose Compazine  before meals TID due to frequent nausea impacting his ability to eat meals. 7/22- still has severe loss of appetite, even though nausea is doing somewhat better- will Add Periacin 4 mg at bedtime- cannot add Remeron due to prolonged QT interval  7/23- will schedule Reglan  5 mg TID-AC to also help with nausea . 7/24- better appetite and nausea this AM- ate 2 bowls cheerios! Making gains 7/25- Will increase Compazine  to 10 mg before meals and changed toming of Reglan  to get 1 hour before meals as well   7/27--ate  100-100-25% yesterday   -he's trying!   -encouraged wife to bring in food from home/outside   -continue periactin   7/28- no improvement in nausea- will d/w team  7/29- spoke with pharmacy- cannot add Remeron- due to prolonged QT interval and already on reglan -   7/30- added Zoloft  yesterday- will monitor  7/31- nausea worse in evening with dinner- Albumin  is dropping- on A lot of supplements- not sure he's taking them- will double with wife. 8/3- ate 1.5 bowls of cheerios this AM  8/5- pt up 8 lbs in 1 day- will double check with nursing. 8/6-  weight 90.3 kg today- now just 4 lbs up from 2 days ago- - we discussed needing to drink 80 oz/day or so to get the fluids he needs and to drink with Mio, etc- and get bottle that shows how much he's actually drinking.  16. UTI- waiting for Urine Cx  8/1-  Has UTI- started Keflex  500 mg BID due to renal impairment-  and waiting for U Cx- reordered, just in case.   8/2- don't see any results so far for Cx  8/3- >100k GNR- still pending  8/4 urine growing Klebsiella pneumonia and Citrobacter freundii, change abx to levofloxacin  250mg  po daily for 5 days   I spent a total of 51    minutes on total care today- >50% coordination of care- due to  D/w nursing, wife, therapy and went over imaging multiple times- seen at 7:30pm, doesn't appear to need to call Dr Sheldon tonight, but will call in AM and d/w him the CT scan results and plan. Stopped IVFs and will recheck labs in AM  LOS: 21 days A FACE TO FACE EVALUATION WAS PERFORMED  Teighan Aubert 06/17/2024, 7:17 PM

## 2024-06-17 NOTE — Progress Notes (Signed)
 Physical Therapy Session Note  Patient Details  Name: Kristopher Thompson MRN: [id number] Date of Birth: [birth year]  Today's Date: 06/17/2024 PT Individual Time: 0804-0859 PT Individual Time Calculation (min): 55 min   Short Term Goals: Week 1:  PT Short Term Goal 1 (Week 1): = LTG  Skilled Therapeutic Interventions/Progress Updates:     Pt received semi reclined in bed and agrees to therapy. No complaint of pain. Pt working with RN at time of PT entry, as multiple drains had been leaking. PT assists with  mobilizing pt in bed to assist with RN tasks, providing verbal and tactile cues for logrolling and positioning in bed. PT also provides totalA to done thigh high ted hose and bilateral ace wraps up to mid thigh to promote improved blood volume return. Pt performs supine to sit with minA and cues for hand placement and body mechanics. Upon sitting edge of bed, pt doff and dons shirt with setup assistance and cues for posture. Pt performs sit to stand with cues for hand placement and initiation, from slightly elevated bed. Pt ambulates x100' with CGA and WC follow for pt's comfort. PT provides cues for upright posture and gaze to improve balance, as well as increasing stride length and step height. Following extended seated rest break, pt stands and ambulates additional x175' with similar assistance and cues. Following, pt left seated in [facility] with all needs within reach.   Therapy Documentation Precautions:  Precautions Precautions: Fall Recall of Precautions/Restrictions: Intact Precaution/Restrictions Comments: Urostomy/Colostomy, Ab wound vac, B nephrostomy drains Restrictions Weight Bearing Restrictions Per Provider Order: No   Therapy/Group: Individual Therapy  Elsie JAYSON Dawn, PT, DPT 06/17/2024, 5:10 PM

## 2024-06-17 NOTE — Progress Notes (Signed)
 Occupational Therapy Session Note  Patient Details  Name: Kristopher Thompson MRN: [id number] Date of Birth: Dec 10, 1950  Today's Date: 06/17/2024 OT Individual Time: 9099-9054 & 8584-8482 OT Individual Time Calculation (min): 45 min & 62 min    Short Term Goals: Week 3:  OT Short Term Goal 1 (Week 3): STG=LTG d/t ELOS  Skilled Therapeutic Interventions/Progress Updates:      Therapy Documentation Precautions:  Precautions Precautions: Fall Recall of Precautions/Restrictions: Intact Precaution/Restrictions Comments: Urostomy/Colostomy, Ab wound vac, B nephrostomy drains Restrictions Weight Bearing Restrictions Per Provider Order: No Session 1 General:  Pt seated in W/C upon OT arrival, agreeable to OT. Wife present  Pain: no pain reported  Exercises: Pt completed the following exercise circuit in order to improve functional activity, strength and endurance to prepare for ADLs such as bathing. Pt completed the following exercises in seated position with no noted LOB/SOB and various repetitions on each exercise: -seated marches with 3# ankle weights, 3x for 1 min -LAQ with 3# ankle weights, 3x for 1 min -seated rows with 5# dowel rod, 3x for 1 min, 2 short rest breaks  Other Treatments: OT verbally educated pt and wife about ACE wrapping since pt will require them at home for BP management. OT will provide handout in later session and have wife complete for carryover education for D/C.    Pt supine in bed with bed alarm activated, 2 bed rails up, call light within reach and 4Ps assessed. Wife present.   Session 2 General:  Pt supine in bed upon OT arrival, agreeable to OT session.  Pain: no pain reported  ADL: OT providing skilled intervention on ADL retraining in order to increase independence with tasks and increase activity tolerance. Pt completed the following tasks at the current level of assist: Bed mobility: Min A from lowered bed supine><EOB Transfers: SBA from  bed>W/C, Min A W/C>nu step d/t 1 instance of LOB requiring Min to correct  Exercises: Pt completed 10 minutes of nu step bike in order to increase BUE/BLEstrength and endurance in preparation for increased independence in ADL. No rest break needed. OT challenging pt on hills mode on nu step with resistance between 1-8, pt able to complete with moderate difficulty, although able to push through without SOB. Total of 299 steps with an average of 27 SPM and total of 0.2 mi.    Pt supine in bed with bed alarm activated, 2 bed rails up, call light within reach and 4Ps assessed.   Therapy/Group: Individual Therapy  Camie Hoe, OTD, OTR/L 06/17/2024, 4:10 PM

## 2024-06-17 NOTE — Plan of Care (Signed)
  Problem: Consults Goal: RH GENERAL PATIENT EDUCATION Description: See Patient Education module for education specifics. Outcome: Progressing   Problem: RH BOWEL ELIMINATION Goal: RH STG MANAGE BOWEL WITH ASSISTANCE Description: STG Manage Bowel with minimal assistance Assistance. Outcome: Progressing   Problem: RH BLADDER ELIMINATION Goal: RH STG MANAGE BLADDER WITH ASSISTANCE Description: STG Manage Bladder With minimal  Assistance Outcome: Progressing   Problem: RH SKIN INTEGRITY Goal: RH STG SKIN FREE OF INFECTION/BREAKDOWN Description: Manage skin free of infection/breakdown with minimal assistance Outcome: Progressing   Problem: RH SAFETY Goal: RH STG ADHERE TO SAFETY PRECAUTIONS W/ASSISTANCE/DEVICE Description: STG Adhere to Safety Precautions With minimal assistance Assistance/Device. Outcome: Progressing   Problem: RH PAIN MANAGEMENT Goal: RH STG PAIN MANAGED AT OR BELOW PT'S PAIN GOAL Description: <4 w/ prns Outcome: Progressing   Problem: RH KNOWLEDGE DEFICIT GENERAL Goal: RH STG INCREASE KNOWLEDGE OF SELF CARE AFTER HOSPITALIZATION Description: Manage increase knowledge of self care after hospitalization with minimal assistance from spouse using educational materials provided Outcome: Progressing

## 2024-06-17 NOTE — Progress Notes (Signed)
 Physical Therapy Note  Patient Details  Name: ABHIMANYU CRUCES MRN: [id number] Date of Birth: 08-23-1951 Today's Date: 06/17/2024    Pt taken for loopogram procedure, visit missed.  Will attempt later today if time allowed.    Arland RAMAN Lora Chavers 06/17/2024, 12:47 PM

## 2024-06-17 NOTE — Progress Notes (Addendum)
 Waiting for loopogram to be done.  Have ordered yesterday.  Discussed with Dr. Alvaro with [facility] (covering for Dr. Renda this week)  IF: loopogram confirms there is no evidence of any more ileal conduit urine leak at the urostomy;   THEN: Cap both nephrostomy tubes. (Remove from gravity bag & cap the drains.  Can ask interventional radiology to do that immediately in IR if test shows no leak))  Regardless of test results, keep right lower quadrant abdominal wall 19Fr Blake strength drain to bag drainage

## 2024-06-18 ENCOUNTER — Other Ambulatory Visit (HOSPITAL_COMMUNITY): Payer: Self-pay

## 2024-06-18 DIAGNOSIS — R5381 Other malaise: Secondary | ICD-10-CM | POA: Diagnosis not present

## 2024-06-18 DIAGNOSIS — K631 Perforation of intestine (nontraumatic): Secondary | ICD-10-CM | POA: Diagnosis not present

## 2024-06-18 LAB — CBC WITH DIFFERENTIAL/PLATELET
Abs Immature Granulocytes: 0.22 K/uL — ABNORMAL HIGH (ref 0.00–0.07)
Basophils Absolute: 0.1 K/uL (ref 0.0–0.1)
Basophils Relative: 1 %
Eosinophils Absolute: 0.8 K/uL — ABNORMAL HIGH (ref 0.0–0.5)
Eosinophils Relative: 9 %
HCT: 33.2 % — ABNORMAL LOW (ref 39.0–52.0)
Hemoglobin: 10.7 g/dL — ABNORMAL LOW (ref 13.0–17.0)
Immature Granulocytes: 3 %
Lymphocytes Relative: 18 %
Lymphs Abs: 1.6 K/uL (ref 0.7–4.0)
MCH: 28.2 pg (ref 26.0–34.0)
MCHC: 32.2 g/dL (ref 30.0–36.0)
MCV: 87.6 fL (ref 80.0–100.0)
Monocytes Absolute: 1.2 K/uL — ABNORMAL HIGH (ref 0.1–1.0)
Monocytes Relative: 13 %
Neutro Abs: 5 K/uL (ref 1.7–7.7)
Neutrophils Relative %: 56 %
Platelets: 275 K/uL (ref 150–400)
RBC: 3.79 MIL/uL — ABNORMAL LOW (ref 4.22–5.81)
RDW: 14.6 % (ref 11.5–15.5)
WBC: 8.9 K/uL (ref 4.0–10.5)
nRBC: 0 % (ref 0.0–0.2)

## 2024-06-18 LAB — BASIC METABOLIC PANEL WITH GFR
Anion gap: 7 (ref 5–15)
BUN: 26 mg/dL — ABNORMAL HIGH (ref 8–23)
CO2: 17 mmol/L — ABNORMAL LOW (ref 22–32)
Calcium: 9.3 mg/dL (ref 8.9–10.3)
Chloride: 110 mmol/L (ref 98–111)
Creatinine, Ser: 1.27 mg/dL — ABNORMAL HIGH (ref 0.61–1.24)
GFR, Estimated: >60 mL/min (ref >60)
Glucose, Bld: 90 mg/dL (ref 70–99)
Potassium: 3.4 mmol/L — ABNORMAL LOW (ref 3.5–5.1)
Sodium: 134 mmol/L — ABNORMAL LOW (ref 135–145)

## 2024-06-18 LAB — ALBUMIN: Albumin: 2.5 g/dL — ABNORMAL LOW (ref 3.5–5.0)

## 2024-06-18 LAB — GLUCOSE, CAPILLARY
Glucose-Capillary: 90 mg/dL (ref 70–99)
Glucose-Capillary: 111 mg/dL — ABNORMAL HIGH (ref 70–99)
Glucose-Capillary: 115 mg/dL — ABNORMAL HIGH (ref 70–99)
Glucose-Capillary: 114 mg/dL — ABNORMAL HIGH (ref 70–99)

## 2024-06-18 LAB — PREALBUMIN: Prealbumin: 26 mg/dL (ref 18–38)

## 2024-06-18 MED ORDER — CYPROHEPTADINE HCL 4 MG PO TABS
4.0000 mg | ORAL_TABLET | Freq: Every day | ORAL | 0 refills | Status: DC
  Filled 2024-06-18: qty 30, 30d supply, fill #0

## 2024-06-18 MED ORDER — ACETAMINOPHEN 325 MG PO TABS: 325.0000 mg | ORAL_TABLET | ORAL | Status: DC | PRN

## 2024-06-18 MED ORDER — PROCHLORPERAZINE MALEATE 5 MG PO TABS
5.0000 mg | ORAL_TABLET | Freq: Four times a day (QID) | ORAL | 0 refills | Status: DC | PRN
  Filled 2024-06-18: qty 30, 8d supply, fill #0

## 2024-06-18 MED ORDER — LEVOFLOXACIN 250 MG PO TABS
250.0000 mg | ORAL_TABLET | ORAL | 0 refills | Status: AC
  Filled 2024-06-18: qty 2, 2d supply, fill #0

## 2024-06-18 MED ORDER — MELATONIN 3 MG PO TABS: 3.0000 mg | ORAL_TABLET | Freq: Every day | ORAL | Status: DC

## 2024-06-18 MED ORDER — OXYCODONE-ACETAMINOPHEN 5-325 MG PO TABS
2.0000 | ORAL_TABLET | Freq: Four times a day (QID) | ORAL | 0 refills | Status: DC | PRN
  Filled 2024-06-18: qty 21, 4d supply, fill #0

## 2024-06-18 MED ORDER — SERTRALINE HCL 100 MG PO TABS
100.0000 mg | ORAL_TABLET | Freq: Every day | ORAL | 0 refills | Status: DC
  Filled 2024-06-18: qty 30, 30d supply, fill #0

## 2024-06-18 MED ORDER — PSYLLIUM 95 % PO PACK: 1.0000 | PACK | Freq: Every day | ORAL | Status: DC

## 2024-06-18 MED ORDER — FLUDROCORTISONE ACETATE 0.1 MG PO TABS
0.2000 mg | ORAL_TABLET | Freq: Every day | ORAL | 0 refills | Status: DC
Start: 2024-06-19 — End: 2024-08-14
  Filled 2024-06-18: qty 60, 30d supply, fill #0

## 2024-06-18 MED ORDER — ALUM & MAG HYDROXIDE-SIMETH 400-400-40 MG/5ML PO SUSP
15.0000 mL | ORAL | 0 refills | Status: DC | PRN
  Filled 2024-06-18: qty 355, 2d supply, fill #0
  Filled 2024-06-19: qty 355, 4d supply, fill #0

## 2024-06-18 MED ORDER — MEDIHONEY WOUND/BURN DRESSING EX PSTE
1.0000 | PASTE | Freq: Every day | CUTANEOUS | 0 refills | Status: DC
  Filled 2024-06-18: qty 44, 44d supply, fill #0

## 2024-06-18 MED ORDER — MIDODRINE HCL 5 MG PO TABS
15.0000 mg | ORAL_TABLET | Freq: Three times a day (TID) | ORAL | 0 refills | Status: DC
Start: 2024-06-18 — End: 2024-08-13
  Filled 2024-06-18: qty 270, 30d supply, fill #0

## 2024-06-18 MED ORDER — ROSUVASTATIN CALCIUM 10 MG PO TABS
10.0000 mg | ORAL_TABLET | Freq: Every evening | ORAL | 0 refills | Status: DC
  Filled 2024-06-18: qty 30, 30d supply, fill #0

## 2024-06-18 MED ORDER — METOCLOPRAMIDE HCL 5 MG PO TABS
5.0000 mg | ORAL_TABLET | Freq: Three times a day (TID) | ORAL | 0 refills | Status: DC
Start: 2024-06-18 — End: 2024-08-14
  Filled 2024-06-18: qty 90, 30d supply, fill #0

## 2024-06-18 MED ORDER — FERROUS SULFATE 325 (65 FE) MG PO TABS
325.0000 mg | ORAL_TABLET | Freq: Two times a day (BID) | ORAL | 0 refills | Status: DC
  Filled 2024-06-18: qty 60, 30d supply, fill #0

## 2024-06-18 MED ORDER — POTASSIUM CHLORIDE CRYS ER 20 MEQ PO TBCR
40.0000 meq | EXTENDED_RELEASE_TABLET | Freq: Once | ORAL | Status: AC
  Administered 2024-06-18: 40 meq via ORAL
  Filled 2024-06-18: qty 2

## 2024-06-18 MED ORDER — GERHARDT'S BUTT CREAM
1.0000 | TOPICAL_CREAM | Freq: Two times a day (BID) | CUTANEOUS | 0 refills | Status: DC
  Filled 2024-06-18: qty 60, 30d supply, fill #0

## 2024-06-18 NOTE — Progress Notes (Signed)
 PROGRESS NOTE   Subjective/Complaints:   Loopogram (that wasn't true loopogram per Dr Sheldon) looks OK Went over CT results with pt- d/w Dr Sheldon.  Pt reports he's groggy in AM- likely due to Periactin , so cannot increase it if already groggy.   Not eating much better-  ROS:    Pt denies SOB, abd pain, CP, N/V/C/D, and vision changes   Per HPI- tachycardia is improving some  Objective:   DG Loopogram Result Date: 06/17/2024 INDICATION: Patient with a history of a radical cystectomy/ileal conduit urinary diversion with a known leak of the ileal conduit with bilateral nephrostomy tubes placed 05/04/24 as well as placement of a right retrograde ureteral stent 05/14/24. Request for loopogram to assess for further evidence of urinary leak. COMPARISON:  DG LOOPOGRAM 05/12/24 CONTRAST:  A total of 60 mL Isovue -300 administered was administered into the bilateral nephrostomy tubes as well as retrograde ureteral stent. FLUOROSCOPY TIME:  21 mGy COMPLICATIONS: None immediate. TECHNIQUE: Informed written consent was obtained from the patient after a discussion of the risks, benefits and alternatives to treatment. Questions regarding the procedure were encouraged and answered. A timeout was performed prior to the initiation of the procedure. A pre procedural spot fluoroscopic image was obtained. The bilateral flank and external portions of existing nephrostomy catheters and right ureteral stent were prepped and sterilized. Antegrade injection of 20 mL of contrast into the existing right nephrostomy catheter demonstrated appropriate positioning within the renal pelvis. After several minutes, no contrast was identified within the right ureter or ileal conduit. Antegrade injection of 20 mL of contrast into the existing left nephrostomy catheter demonstrated appropriate positioning within the renal pelvis. After several minutes, a small volume of contrast was  noted to flow through the left ureter into the ileal conduit. Retrograde injection of the right ureteral stent opacified the right ureter and right renal collecting system. No contrast extravasation observed. Temporary dressing applied to the ileal conduit ostomy site. Bilateral nephrostomy tubes were capped as requested by ordering MD. The patient tolerated the procedure well without immediate postprocedural complication. FINDINGS: The existing right nephrostomy catheter is appropriately positioned and functioning. Right retrograde ureteral stent in place through ileal conduit. IMPRESSION: 1. Right nephrostogram via the right nephrostomy tube and right ureteral stent demonstrates normally patent ureter and ureteral anastomosis with the ileal conduit. No right-sided urine leak demonstrated. 2. Left nephrostogram through left nephrostomy tube appears in good position with evidence of contrast flow to the distal ureter. No left-sided urine leak demonstrated. Performed by: Kacie Matthews PA-C Electronically Signed   By: Ester Sides M.D.   On: 06/17/2024 16:00   CT ABDOMEN PELVIS W WO CONTRAST Result Date: 06/17/2024 CLINICAL DATA:  Abdominal pain, postop, ileal conduit leak. EXAM: CT ABDOMEN AND PELVIS WITHOUT AND WITH CONTRAST TECHNIQUE: Multidetector CT imaging of the abdomen and pelvis was performed following the standard protocol before and following the bolus administration of intravenous contrast. RADIATION DOSE REDUCTION: This exam was performed according to the departmental dose-optimization program which includes automated exposure control, adjustment of the mA and/or kV according to patient size and/or use of iterative reconstruction technique. CONTRAST:  75mL OMNIPAQUE  IOHEXOL  350 MG/ML SOLN COMPARISON:  05/12/2024.  FINDINGS: Lower chest: A few tiny pulmonary nodules are unchanged from 08/29/2018 and are considered benign. Heart size normal. No pericardial effusion. No pleural effusion. Distal esophagus  is grossly unremarkable. Hepatobiliary: Liver is grossly unremarkable. Cholecystectomy. No biliary ductal dilatation. Pancreas: Chronic calcific pancreatitis. 1.3 cm low-attenuation lesion in the pancreatic tail (3/21), unchanged from 08/29/2018, compatible with a pseudocyst. Spleen: Negative. Adrenals/Urinary Tract: Negative. Stomach/Bowel: Bilateral percutaneous nephrostomies with contrast in the collecting systems bilaterally, secondary to a loopogram performed prior to this study. Possible small stone in the right kidney (3/24). Persistent moderate to severe dilatation of the right intrarenal collecting system and right ureter with a double-J right ureteral stent in place, proximal loop in the right renal pelvis and distal portion the on the right lower quadrant ileal conduit. Left kidney is atrophic and scarred. Partial opacification of the left ureter which is decompressed. Ileal conduit in the right lower quadrant which may be stenotic as it traverses the abdominal wall, as on 05/12/2024. No extraluminal contrast. Cysto prostatectomy. Vascular/Lymphatic: Stomach is unremarkable. Right lower quadrant ileostomy and separate ileal conduit. Small bowel and colon are otherwise unremarkable. Appendix is not readily visualized. Reproductive: Prostatectomy. Other: Small right inguinal hernia contains fat. Enlarging broad fluid collection along the right ventral pelvic wall, just above the ileal conduit, measuring 3.4 x 21.6 cm. A percutaneous drain is seen within. No free fluid or extraluminal contrast. Musculoskeletal: Degenerative changes in the spine. Old L1 compression fracture. IMPRESSION: 1. 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 2. Enlarging fluid collection along the right ventral pelvic abdominal wall with a percutaneous drain in place, indicative of an abscess. 3. 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 4. Right lower quadrant ileostomy. 5. Probable small right renal stone. 6. Chronic calcific pancreatitis. Electronically Signed   By: Newell Eke M.D.   On: 06/17/2024 15:47    Recent Labs    06/18/24 0341  WBC 8.9  HGB 10.7*  HCT 33.2*  PLT 275    Recent Labs    06/18/24 0341  NA 134*  K 3.4*  CL 110  CO2 17*  GLUCOSE 90  BUN 26*  CREATININE 1.27*  CALCIUM  9.3    Intake/Output Summary (Last 24 hours) at 06/18/2024 1025 Last data filed at 06/18/2024 0835 Gross per 24 hour  Intake 476 ml  Output 1500 ml  Net -1024 ml        Physical Exam: Vital Signs Blood pressure 117/66, pulse 100, temperature 98.2 F (36.8 C), resp. rate 18, height 5' 11 (1.803 m), weight 93.3 kg, SpO2 97%.      General: awake, alert, appropriate, wife and pt putting on TEDs; sitting up at 60-70 degrees in bed- wife and PT and nurse at bedside;  NAD HENT: conjugate gaze; oropharynx moist CV: regular rhythm, rate borderline tachycardic; ; no JVD Pulmonary: CTA B/L; no W/R/R- good air movement GI: soft, NT, ND, (+)BS- stable Psychiatric: appropriate- still flat- complicated by [facility]- wearing hearing aids Neurological: Ox3  Skin- wife changing ostomies- look great- and abd wound more superficial on exam   Extremities: No LE edema B/L    Assessment/Plan: 1. Functional deficits which require 3+ hours per day of interdisciplinary therapy in a comprehensive inpatient rehab setting. Physiatrist is providing close team supervision and 24 hour management of active medical problems listed below. Physiatrist and rehab team continue to assess barriers to discharge/monitor patient progress  toward functional and medical goals  Care Tool:  Bathing    Body parts bathed by patient: Right arm, Left arm, Chest, Abdomen, Front perineal area, Right upper leg, Left upper leg, Face, Buttocks, Left lower leg, Right lower leg   Body parts bathed by helper: Buttocks, Right  lower leg, Left lower leg     Bathing assist Assist Level: Contact Guard/Touching assist     Upper Body Dressing/Undressing Upper body dressing   What is the patient wearing?: Pull over shirt    Upper body assist Assist Level: Set up assist    Lower Body Dressing/Undressing Lower body dressing      What is the patient wearing?: Pants     Lower body assist Assist for lower body dressing: Contact Guard/Touching assist     Toileting Toileting Toileting Activity did not occur (Clothing management and hygiene only): N/A (no void or bm) (urostomy/ilieostomy)  Toileting assist       Transfers Chair/bed transfer  Transfers assist     Chair/bed transfer assist level: Supervision/Verbal cueing     Locomotion Ambulation   Ambulation assist      Assist level: Supervision/Verbal cueing Assistive device: Walker-rolling Max distance: 200'   Walk 10 feet activity   Assist  Walk 10 feet activity did not occur: Safety/medical concerns (pt too weak, fatigue)  Assist level: Supervision/Verbal cueing Assistive device: Walker-rolling   Walk 50 feet activity   Assist Walk 50 feet with 2 turns activity did not occur: Safety/medical concerns (pt too weak, fatigue)  Assist level: Supervision/Verbal cueing Assistive device: Walker-rolling    Walk 150 feet activity   Assist Walk 150 feet activity did not occur: Safety/medical concerns (pt too weak, fatigue)  Assist level: Supervision/Verbal cueing Assistive device: Walker-rolling    Walk 10 feet on uneven surface  activity   Assist Walk 10 feet on uneven surfaces activity did not occur: Safety/medical concerns (pt too weak, fatigue)         Wheelchair     Assist Is the patient using a wheelchair?: Yes Type of Wheelchair: Manual    Wheelchair assist level: Supervision/Verbal cueing Max wheelchair distance: 155'    Wheelchair 50 feet with 2 turns activity    Assist    Wheelchair 50 feet  with 2 turns activity did not occur:  (pt too weak, fatigue)   Assist Level: Supervision/Verbal cueing   Wheelchair 150 feet activity     Assist  Wheelchair 150 feet activity did not occur:  (pt too weak, fatigue)   Assist Level: Supervision/Verbal cueing   Blood pressure 117/66, pulse 100, temperature 98.2 F (36.8 C), resp. rate 18, height 5' 11 (1.803 m), weight 93.3 kg, SpO2 97%.  Medical Problem List and Plan: 1. Functional deficits secondary to debility 2/2 perforated bowel             -patient may shower              -ELOS/Goals: 10-14 days S            D/c 06/19/24  Pt's OH is really limiting therapy as well as nausea Wife to get products ot help leakage from ostomies  Con't CIR Con't PT and OT  D/w Dr Sheldon- doesn't have abscess- is chronic seroma- should improve as Albumin  rises- has drain in it  - looking to cap drains- and then f/u with Urology in 1-2 weeks after d/c and f/u with Dr Sheldon- also needs PCP appt ASAP after d/c 2.  Antithrombotics: -DVT/anticoagulation:  Mechanical:  Antiembolism stockings, knee (TED hose) Bilateral lower extremities             -antiplatelet therapy: continue Eliquis  3. Pain Management: continue Scheduled Tylenol ; Oxycodone  as needed took 30mg  Oxy IR yesterday , 20mg  thus far today    7/17- took tylenol  this AM- pain down to 2-3/10- will con't regimen and monitor if needs more with therapy  7/22- Will change tylenol  to 500 mg BID and con't oxy prn  7/23- hasn't required pain meds overnight- doing better  7/24-27- pain control improved  7/28- pain doing well except sides burning this AM- but better than usual  7/29-8/1- taking pain meds ~ q6 hours per last 24 hours  8/5- not taking pain meds- wants to go home with 1 week/20 pills at d/c- which I agree with 4. Mood/Behavior/Sleep: LCSW to follow for evaluation and support when available.              -antipsychotic agents: N/A             -anxiety: continue Atarax  25 mg   7/29- added  Zoloft   8/3- Brighter affect  8/5- more flat today- will increase Zoloft   5. Neuropsych/cognition: This patient is capable of making decisions on his own behalf.   6. Complicated wound: WOC nurse consultation             - Continue wound VAC             Zosyn  d/ced as per surgery recommendation   7/26-7 hydrogel and gauze dressing in place. Midline abd wound looks great 7. Fluids/Electrolytes/Nutrition: Monitor I's & O's--Recheck monitor labs routinely CBC/CMP              - Bowel regimen for ileostomy: Continue FiberCon and loperamide  twice daily Goal is [phone] mL out fecal ileostomy a day              - Carb/cardiac diet -continue Juven/Prosource/Ensure supplements              -Nausea/vomiting:  seems to have improved- PRN orders Reglan  and Zofran  7/24- made Reglan  scheduled 5 mg TID-AC- and nausea is somewhat better this AM 7/26-27  nausea seems a bit better.   -doesn't have much appetite but is trying to eat -ate 100-100-25% yesterday 7/28- still nausea- no change with med changes-   7/31- isn't eating well- Albumin  down to 2.5 from 3.3 2 weeks ago.  He's on Prosource 2x/day and supplements 2x/day with max protein- will d/w pt if drinking them 8/7- Albumin  stable at 2.5! 8. AKI/CKD stage IIIb: Creatinine improving 2.3 now 1.91 -monitor BMP   7/17- Cr 2.05- on IVF's 125/cc/hour- will continue for now and recheck labs in AM             - On continuous IVF NS 125 mL/hr   7/18- Will reduce IVFs to 75cc/hour-     Latest Ref Rng & Units 06/18/2024    3:41 AM 06/15/2024    4:02 AM 06/13/2024    4:16 AM  BMP  Glucose 70 - 99 mg/dL 90  898  99   BUN 8 - 23 mg/dL 26  36  25   Creatinine 0.61 - 1.24 mg/dL 8.72  8.58  8.75   Sodium 135 - 145 mmol/L 134  133  133   Potassium 3.5 - 5.1 mmol/L 3.4  3.9  3.8   Chloride 98 - 111 mmol/L 110  105  107   CO2 22 - 32 mmol/L  17  18  18    Calcium  8.9 - 10.3 mg/dL 9.3  89.5  89.7     2/79- reduce IVF to 50cc/hr Recent ECHO showed normal Ej fx, no  concerns or signs of fluid overload at this time   7/22- will stop IVF's- IV is leaking and pt wants to try off it.   7/23- labs in AM  7/24- Cr up to 1.28- from 1.08- will recheck Monday- is up a little because pt not drinking as much, will push fluids still  7/27 BUN/Cr   back up yesterday   -continue IVF tonight.   7/28- BUN/Cr 22 and 1.24  7/31- Pt's BUN is down to 23; but Cr up slightly to 1.36- in spite of IVFs- I'm concerned will go home and get dehydrated- we discussed NEEDS to drink more than 8 cups/day.   8/1-  Will stop IVFs for now, because nursing reports pt has wet sounding lungs- will recheck labs in AM  8/2- Cr better- but BUN slightly up more to 25- will check labs again in AM to monitor if need to go back on IVFs- pt pushing to drink 6-8 cups/day  8/3- labs in AM  8/4 Doesn't appear to have much oral fluid intake although nursing/therapy encouraging, Bun and Cr up to 1.41/36, will start NS 50/hr  8/5- cannot go home on PICC with IVF's- not allowed by insurance and medically too high risk to get PICC when can drink  8/6- will try again to stop IVFs- and will check labs in AM  8/7- Cr 1.27 and BUN 26-will recheck in AM before d/c- and maybe might need to go to PCP's to be tanked up a few times per week if necessary? 9. DM type II: 04/01/24 Hemoglobin A1c 5.3  -Monitor BS AC/at bedtime and use SSI 7/17- will change to regular diet and try to have pt make good choices- since poor intake- CBG's low 7/18- BG's 84-125- well controlled- con't regimen- probably low due to poor intake.  CBG (last 3)  Recent Labs    06/17/24 1706 06/17/24 2055 06/18/24 0607  GLUCAP 106* 129* 90    8/3- still good control- con't regimen 8/6 -8/7 cbgs controlled overall- look good 10.  Severe Hypotension: Order orthostatic vitals              - teds ordered             -increase midodrine  to 10mg  TID   7/17- BP running 80s systolic and feels lightheaded- Midodrine  was increased yesterday to 10 mg  TID- if doesn't improve today, will order ACE wraps as well as Thigh high TED's- cannot do abd binder due to abd drains, VAC and ostomies- if this doesn't improve in next 24 hours, will add Florinef .  Also liberalized his diet to regular diet so can have some salt to help.   7/18- will add Florinef  0.1 mg daily- advised therapy to add ACE wraps- wasn't started yesterday- pt IS symptomatic- will also continue IVFs for now Vitals:   06/17/24 1947 06/18/24 0546  BP: 106/66 117/66  Pulse: 100 100  Resp: 18 18  Temp: 98.7 F (37.1 C) 98.2 F (36.8 C)  SpO2: 98% 97%  Overall improved since 7/16 but remains soft cont IVF but at lower rate   7/22- Stopped IVFs since IV is leaking- pt wants ot get rid of IVF's- pt needs to drink 6-8 cups of milk juice or water /day- if labs get worse on Thursday, will restart  IVFs. BP's running low 100's systolic- better than before.   7/23- per pt, less [facility]  7/24- still having OH per therapy- will d/w PT/OT before changing meds  7/25- BP 70/s40s standing this AM with ACE wraps and TEDs to thighs- will increase Florinef  to 0.2 mg since has been 1 week since started- will also have everyone push fluids  7/27 sitting and lying readings were almost equal yesterday  7/30- pt's BP dropped to 90s/60s this AM- so will con't TEDs and ACE wraps  7/31- Not dizzy with sitting EOB this AM- does feel a little weak- asked PT to f/u on BP's  8/1- pt didn't AM midodrine - til 10:15- he had difficulty with therapy- increased Midodrine  to 15 mg TID  8/2- went over timing with wife and pt- is correct at 0630, 1030 and 230 pm  8/2- HR running low 100's appeared to get worse around 7/28, however no signs of PE other than tachycardia AND on Eliquis  5mg  BID so very unlikely- probably due to brewing UTI that's improving-   8/4 restart IVF NS as above, continue to encourage oral fluids   8/5- Was less orthostatic yesterday  8/6- 8/7no tachycardia at rest this AM- getting better- able to place  own TEDs now with wife's help 11. Stage I Obesity: BMI 31.49 kg/m  -educate on diet and weight loss to promote overall health and mobility.   7/17- BMI down to 28.44 7/22- BMI 28.44 7/28- will ask nursing to check weight 7/31- will ask nursing to add Daily weights- concerned he's losing weight when doesn't need to right now 8/2- BMI 28.26- keeping pretty stable 12. Hyperlipidemia: Continue Crestor    13. Screening for vitamin D  deficiency:check vitamin D  level tomorrow 14. Hyponatremia  7/17- Na down to 128- but is on IVF's- hard to fluid restrict due to Renal issues, so will keep on IVF's and recheck in AM  7/18- Na up to 134- will not intervene more  7/22- Na up to 136  7/26- Na 135  7/31- Na 131  8/2- Na up to 133  8/4 Na stable 133 15. Poor appetite, nausea- frequent  7/18- will schedule low dose Compazine  before meals TID due to frequent nausea impacting his ability to eat meals. 7/22- still has severe loss of appetite, even though nausea is doing somewhat better- will Add Periacin 4 mg at bedtime- cannot add Remeron due to prolonged QT interval  7/23- will schedule Reglan  5 mg TID-AC to also help with nausea . 7/24- better appetite and nausea this AM- ate 2 bowls cheerios! Making gains 7/25- Will increase Compazine  to 10 mg before meals and changed toming of Reglan  to get 1 hour before meals as well   7/27--ate 100-100-25% yesterday   -he's trying!   -encouraged wife to bring in food from home/outside   -continue periactin   7/28- no improvement in nausea- will d/w team  7/29- spoke with pharmacy- cannot add Remeron- due to prolonged QT interval and already on reglan -   7/30- added Zoloft  yesterday- will monitor  7/31- nausea worse in evening with dinner- Albumin  is dropping- on A lot of supplements- not sure he's taking them- will double with wife. 8/3- ate 1.5 bowls of cheerios this AM  8/5- pt up 8 lbs in 1 day- will double check with nursing. 8/6-  weight 90.3 kg today-  now just 4 lbs up from 2 days ago- - we discussed needing to drink 80 oz/day or so to get the fluids he needs and  to drink with Mio, etc- and get bottle that shows how much he's actually drinking. 8/7- Weight up 3 kg?? Don't see how- will have nursing recheck- ate 2 bowls this Am of cheerios- nausea is better per pt- still poor appetite- not getting much better, but gorggy in AM, so cannot increase Periactin   16. UTI- waiting for Urine Cx  8/1-  Has UTI- started Keflex  500 mg BID due to renal impairment-  and waiting for U Cx- reordered, just in case.   8/2- don't see any results so far for Cx  8/3- >100k GNR- still pending  8/4 urine growing Klebsiella pneumonia and Citrobacter freundii, change abx to levofloxacin  250mg  po daily for 5 days  17. Hypokalemia-  8/7-will replete 40 mEq x1.    I spent a total of 57   minutes on total care today- >50% coordination of care- due to  D/w Urology- NP as well as Dr Sheldon at length- went over imaging with pt and wife as well as the read by Dr Sheldon- and we d/w pt that has some choking issues, but also usually occurs when laying back- will work on sitting up at 90 degrees in bed OR in bedside chair/W/C  LOS: 22 days A FACE TO FACE EVALUATION WAS PERFORMED  Lakysha Kossman 06/18/2024, 10:25 AM

## 2024-06-18 NOTE — Discharge Instructions (Addendum)
 Inpatient Rehab Discharge Instructions  Kristopher Thompson Discharge date and time: 06/19/24   Activities/Precautions/ Functional Status: Activity: activity as tolerated and no driving for today and no driving while on analgesics Diet: regular diet and encourage fluids Wound Care: keep wound clean and dry, as directed, reinforce dressing PRN, and routine catheter care Functional status:  ___ No restrictions     ___ Walk up steps independently ___ 24/7 supervision/assistance   __x_ Walk up steps with assistance _x__ Intermittent supervision/assistance  __x_ Bathe/dress independently __x_ Walk with walker    ___ Bathe/dress with assistance ___ Walk Independently    ___ Shower independently __x_ Walk with assistance    ___x Shower with assistance __x_ No alcohol      ___ Return to work/school ________  Special Instructions:  Follow up with PCP in 1 week for labs  My questions have been answered and I understand these instructions. I will adhere to these goals and the provided educational materials after my discharge from the hospital.  Patient/Caregiver Signature _______________________________ Date __________  Clinician Signature _______________________________________ Date __________  Please bring this form and your medication list with you to all your follow-up doctor's appointments.      COMMUNITY REFERRALS UPON DISCHARGE:    Home Health:   PT   RN                  Agency: [facility] Phone: [phone]   Medical Equipment/Items Ordered:wheelchair                                                 Agency/Supplier:[facility]   [phone]

## 2024-06-18 NOTE — Progress Notes (Signed)
 Supplies for home  given to patient. Nurse in room.

## 2024-06-18 NOTE — Progress Notes (Addendum)
 Loopogram completed and read last night.  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  That correlates with less abdominal wall drainage and abdominal wall drainage creatinine normal for the past several weeks.  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    I updated Dr. Renda.  He is going to look at the films himself but suspects it will be safe to cap the nephrostomy tubes and see how things go   As far as the abdominal wall fluid is concerned, this is a chronic seroma.  The drain is sitting in this fluid collection and it is serous.  It is not an abscess or infection.  Drain output has steadily tapered down which is reassuring.  Patient completed 6 weeks of IV antibiotics per infectious disease recommendation even though the Phasix mesh is not a permanent mesh.  The patient has not had any evidence of cellulitis or decline off of antibiotics for almost a month.  That is reassuring.  D/w Dr Cornelio, attending physician at the rehab facility he currently is recovering in.

## 2024-06-18 NOTE — Progress Notes (Signed)
 Occupational Therapy Session Note  Patient Details  Name: Kristopher Thompson MRN: [id number] Date of Birth: 1951/05/30  Today's Date: 06/18/2024 OT Individual Time: 1315-1415 OT Individual Time Calculation (min): 60 min    Short Term Goals: Week 3:  OT Short Term Goal 1 (Week 3): STG=LTG d/t ELOS  Skilled Therapeutic Interventions/Progress Updates:      Therapy Documentation Precautions:  Precautions Precautions: Fall Recall of Precautions/Restrictions: Intact Precaution/Restrictions Comments: Urostomy/Colostomy, B nephrostomy sites Restrictions Weight Bearing Restrictions Per Provider Order: No General: Pt supine in bed upon OT arrival, agreeable to OT session. Wife present.   Pain: no pain reported  ADL: OT providing skilled intervention on ADL retraining in order to increase independence with tasks and increase activity tolerance. Pt completed the following tasks at the current level of assist: Bed mobility: SBA with increased time to complete supine><EOB with use of leg lifter Transfers: SBA bed><W/C, attempted to ambulate to therapy gym, ambulated ~30 ft, unable to complete rest of way d/t fatigue  Balance: Pt completed a variety of standing activities in order to promote increased balance strategies with ADL participation. Pt completed all activities at SBA with RW level with intermittent supported and unsupported standing at RW. Pt completed bean bag toss for 1 trial, standing for ~3-4 min with no rest break.  Exercises: Pt completed the following exercise circuit in order to improve functional activity, strength and endurance to prepare for ADLs such as bathing. Pt completed the following exercises in seated position with no noted LOB/SOB and 3x10 repetitions on each exercise: -resisted leg press -resisted leg curls    Other Treatments: OT providing skilled education for wife to transfer pt OOB, safety plan updated.    Pt supine in bed with bed alarm activated, 2 bed  rails up, call light within reach and 4Ps assessed.   Therapy/Group: Individual Therapy  Camie Hoe, OTD, OTR/L 06/18/2024, 3:40 PM

## 2024-06-18 NOTE — Progress Notes (Addendum)
 Patient ID: Kristopher Thompson, male   DOB: 02-12-1951, 73 y.o.   MRN: [id number]  Met wit pt and wife to discuss discharge tomorrow as long as medically cleared. Still awaiting one surgeon to get back with MD. Wife asked where the wheelchair was and informed her was not told to order one. Will right now. Referral made to [employer] for wheelchair. Wife to expect text regarding co-pay.   1:52 PM Was asked by Pam-PA if home health can give one liter of fluid 3 x week. Home health is not able to provide this according to [facility]. Have let Pam-PA know

## 2024-06-18 NOTE — Progress Notes (Signed)
 Occupational Therapy Discharge Summary  Patient Details  Name: Kristopher Thompson MRN: [id number] Date of Birth: 1951-06-22  Date of Discharge from OT service:June 18, 2024   Patient has met 5 of 5 long term goals due to improved activity tolerance, improved balance, postural control, and ability to compensate for deficits.  Patient to discharge at overall Supervision level.  Patient's care partner is independent to provide the necessary physical assistance at discharge.    Reasons goals not met: All goals met  Recommendation:  Patient will benefit from ongoing skilled OT services in home health setting to continue to advance functional skills in the area of BADL and Reduce care partner burden.  Equipment: W/C  Reasons for discharge: treatment goals met and discharge from hospital  Patient/family agrees with progress made and goals achieved: Yes  OT Discharge Precautions/Restrictions  Precautions Precautions: Fall Precaution/Restrictions Comments: Urostomy/Colostomy, B nephrostomy sites Restrictions Weight Bearing Restrictions Per Provider Order: No ADL ADL Eating: Set up Where Assessed-Eating: Bed level Grooming: Setup Where Assessed-Grooming: Edge of bed Upper Body Bathing: Supervision/safety, Setup Where Assessed-Upper Body Bathing: Edge of bed Lower Body Bathing: Maximal assistance, Moderate assistance Where Assessed-Lower Body Bathing: Edge of bed Upper Body Dressing: Minimal assistance (Due to multiple drains.) Where Assessed-Upper Body Dressing: Edge of bed Lower Body Dressing: Moderate assistance, Maximal assistance Where Assessed-Lower Body Dressing: Edge of bed Toileting: Other (Comment) (Urostomy/Colostomy) Toilet Transfer: Not assessed Tub/Shower Transfer: Unable to assess Film/video editor: Unable to assess Vision Baseline Vision/History: 1 Wears glasses Patient Visual Report: No change from baseline Vision Assessment?: No apparent visual  deficits Perception  Perception: Within Functional Limits Praxis Praxis: WFL Cognition Cognition Overall Cognitive Status: Within Functional Limits for tasks assessed Arousal/Alertness: Awake/alert Orientation Level: Place;Person;Situation Person: Oriented Place: Oriented Situation: Oriented Memory: Appears intact Awareness: Appears intact Problem Solving: Appears intact Safety/Judgment: Appears intact Brief Interview for Mental Status (BIMS) Repetition of Three Words (First Attempt): 3 Temporal Orientation: Year: Correct Temporal Orientation: Month: Accurate within 5 days Temporal Orientation: Day: Correct Recall: Sock: Yes, no cue required Recall: Blue: Yes, no cue required Recall: Bed: Yes, no cue required BIMS Summary Score: 15 Sensation Sensation Light Touch: Appears Intact Hot/Cold: Appears Intact Proprioception: Appears Intact Stereognosis: Not tested Coordination Gross Motor Movements are Fluid and Coordinated: Yes Fine Motor Movements are Fluid and Coordinated: Yes Motor  Motor Motor: Within Functional Limits Mobility  Bed Mobility Bed Mobility: Rolling Right;Rolling Left;Right Sidelying to Sit;Supine to Sit;Sit to Supine;Scooting to [facility] Rolling Right: Supervision/verbal cueing Rolling Left: Supervision/Verbal cueing Right Sidelying to Sit: Supervision/Verbal cueing Supine to Sit: Supervision/Verbal cueing Sit to Supine: Supervision/Verbal cueing Scooting to HOB: Supervision/Verbal Cueing Transfers Sit to Stand: Supervision/Verbal cueing Stand to Sit: Supervision/Verbal cueing  Trunk/Postural Assessment  Cervical Assessment Cervical Assessment: Exceptions to [facility] (forward head) Thoracic Assessment Thoracic Assessment: Exceptions to [facility] (rounded shoulders) Lumbar Assessment Lumbar Assessment: Exceptions to [facility] Postural Control Postural Control: Deficits on evaluation Righting Reactions: still somewhat delayed although better since  eval Protective Responses: still somewhat delayed although better since eval  Balance Balance Balance Assessed: Yes Static Sitting Balance Static Sitting - Balance Support: Feet supported Static Sitting - Level of Assistance: 6: Modified independent (Device/Increase time) Dynamic Sitting Balance Dynamic Sitting - Balance Support: Feet supported Dynamic Sitting - Level of Assistance: 6: Modified independent (Device/Increase time) Static Standing Balance Static Standing - Balance Support: During functional activity;Bilateral upper extremity supported Static Standing - Level of Assistance: 5: Stand by assistance Dynamic Standing Balance Dynamic Standing - Balance Support: During  functional activity Dynamic Standing - Level of Assistance: 5: Stand by assistance Extremity/Trunk Assessment RUE Assessment RUE Assessment: Within Functional Limits LUE Assessment LUE Assessment: Within Functional Limits   Camie Hoe, OTD, OTR/L 06/18/2024, 1:25 PM

## 2024-06-18 NOTE — Progress Notes (Signed)
 Patient ID: Kristopher Thompson, male   DOB: [birth year], 73 y.o.   MRN: [id number]  Discussed with Dr. Sheldon.  Drain output now very low and Cr levels c/w serum.  Will get a loopogram today.  If this suggests leak has sealed, will then consider capping nephrostomy tubes as next step.

## 2024-06-18 NOTE — Progress Notes (Addendum)
 Physical Therapy Session Note  Patient Details  Name: Kristopher Thompson MRN: [id number] Date of Birth: 03-15-1951  Today's Date: 06/18/2024 PT Individual Time: 0945-1100 PT Individual Time Calculation (min): 75 min   Short Term Goals: Week 1:  PT Short Term Goal 1 (Week 1): = LTG Week 2:     Skilled Therapeutic Interventions/Progress Updates:    Pt in bed speaking with Dr. Cornelio when PT entered.  Pt's wife states she donned TED hose this AM with pt's help. PT instructed/educated wife on proper donning of ACE bandages.  Pt agreeable for session.  Pt performed supine->sit @ EOB with railing supevision.  BP seated: 116/88 standing: 78/33 HR 135 ;  standing after seated with marching 85/53  HR 126; after 5' NuStep 97/54 HR 98  Pt c/o dizziness initially when sitting, however, resolves within 1-2 minutes.  Pt ambulated to gym with RW, supervision without rest break.  Transferred to NuStep with supervision x5'. Pt with encouragement able to walk from NuStep to exit doors in gym, wheeled rest of way back to room.  Pt up in w/c for next PT session (15' wait), all items within reach.   Therapy Documentation Precautions:  Precautions Precautions: Fall Recall of Precautions/Restrictions: Intact Precaution/Restrictions Comments: Urostomy/Colostomy, B nephrostomy sites Restrictions Weight Bearing Restrictions Per Provider Order: No General: PT Amount of Missed Time (min): 10 Minutes PT Missed Treatment Reason: Nursing care Vital Signs:   Pain: 0/10     Therapy/Group: Individual Therapy  [name] Fast 06/18/2024, 1:31 PM

## 2024-06-18 NOTE — Progress Notes (Signed)
 Physical Therapy Session Note  Patient Details  Name: Kristopher Thompson MRN: [id number] Date of Birth: 21-Dec-1950  Today's Date: 06/18/2024 PT Individual Time: 0945-1100 PT Individual Time Calculation (min): 75 min   Short Term Goals: Week 3:     Skilled Therapeutic Interventions/Progress Updates:     Pt received upright in [facility] and was agreeable to therapy. Pt denied any pain throughout session. Pt ambulated ~70ft w/ RW and CGA for safety to improve functional activity tolerance. Pt wheeled to main gym // bars and successfully ambulated over 6 hurdles to improve gait navigation over/around obstacles w/ BUE support and minA for safety. Pt standing balance tested on airrex pad, pt was CGA and demonstrated good balance w/ no LOB. Pt then completed stair training w/ supervision assist and wife was educated on how/when to move RW up the stairs. Pt and pt wife demonstrated good carryover by performing w/ proper technique. Pt then able to get in/out of car w/ supervision assist, pt wife educated on breaking down/setting up [facility] for car transport w/ good carryover. Pt demonstrated dynamic standing balance by using reacher to grab bean bags off the floor, given CGA to supervision w/ RW for safety. Pt ambulated ~50 ft with RW and supervision before becoming too fatigued to continue. Pt then propelled w/c with BUE x~75 ft back toward room without rest or help despite asking for assistance. Pt completed ambulatory transfer to bed with supervision and RW and completed bed mobility using leg lifter with supervision. Pt completed transfer to flat bed without use of bed rails. Discussed ways pt's wife could use UE to help reposition trunk in bed. Also discussed curb step navigation to sunken family room to reach pt's favorite chair, but pt reports he will wait and do this later as there are other places to sit in his home. Pt remained in bed and was left with all needs in reach and alarm active.   Therapy  Documentation Precautions:  Precautions Precautions: Fall Recall of Precautions/Restrictions: Intact Precaution/Restrictions Comments: Urostomy/Colostomy, Ab wound vac, B nephrostomy drains Restrictions Weight Bearing Restrictions Per Provider Order: No General: PT Amount of Missed Time (min): 10 Minutes PT Missed Treatment Reason: Nursing care  Therapy/Group: Individual Therapy  Kristopher Thompson 06/18/2024, 11:18 AM

## 2024-06-19 ENCOUNTER — Other Ambulatory Visit (HOSPITAL_COMMUNITY): Payer: Self-pay

## 2024-06-19 DIAGNOSIS — R5381 Other malaise: Secondary | ICD-10-CM | POA: Diagnosis not present

## 2024-06-19 LAB — BASIC METABOLIC PANEL WITH GFR
Anion gap: 8 (ref 5–15)
BUN: 28 mg/dL — ABNORMAL HIGH (ref 8–23)
CO2: 17 mmol/L — ABNORMAL LOW (ref 22–32)
Calcium: 9.7 mg/dL (ref 8.9–10.3)
Chloride: 110 mmol/L (ref 98–111)
Creatinine, Ser: 1.33 mg/dL — ABNORMAL HIGH (ref 0.61–1.24)
GFR, Estimated: 57 mL/min — ABNORMAL LOW (ref >60)
Glucose, Bld: 93 mg/dL (ref 70–99)
Potassium: 3.6 mmol/L (ref 3.5–5.1)
Sodium: 135 mmol/L (ref 135–145)

## 2024-06-19 LAB — GLUCOSE, CAPILLARY: Glucose-Capillary: 96 mg/dL (ref 70–99)

## 2024-06-19 NOTE — Progress Notes (Addendum)
 Inpatient Rehabilitation Discharge Medication Review by a Pharmacist  A complete drug regimen review was completed for this patient to identify any potential clinically significant medication issues.  High Risk Drug Classes Is patient taking? Indication by Medication  Antipsychotic Yes Compazine  prn N/V  Anticoagulant Yes Apixaban  - PE history  Antibiotic Yes Po Levofloxacin  - UTI  Opioid Yes Oxycodone  prn pain  Antiplatelet No   Hypoglycemics/insulin  Yes Metformin - DM  Vasoactive Medication Yes Fludrocortisone , Midodrine  - low BP  Chemotherapy No   Other Yes Cyproheptadine  - appetite Ferrous sulfate  - anemia Medi-honey - wound Rosuvastatin  - HLD Sertraline  - anxiety Cyclobenzaprine  prn spasms Hydroxyzine  prn anxiety Atrovent  spray - allergies     Type of Medication Issue Identified Description of Issue Recommendation(s)  Drug Interaction(s) (clinically significant)     Duplicate Therapy     Allergy     No Medication Administration End Date     Incorrect Dose     Additional Drug Therapy Needed     Significant med changes from prior encounter (inform family/care partners about these prior to discharge).    Other       Clinically significant medication issues were identified that warrant physician communication and completion of prescribed/recommended actions by midnight of the next day:  No  Name of provider notified for urgent issues identified:   Provider Method of Notification:     Pharmacist comments: None  Time spent performing this drug regimen review (minutes):  20 minutes  Thank you. Olam Monte, PharmD

## 2024-06-19 NOTE — Progress Notes (Signed)
 Physical Therapy Discharge Summary  Patient Details  Name: Kristopher Thompson MRN: [id number] Date of Birth: 18-Jun-1951  Date of Discharge from PT service:June 19, 2024  Today's Date: 06/19/2024 PT Individual Time:       Patient has met 12 of 12 long term goals due to improved activity tolerance, improved balance, improved postural control, increased strength, decreased pain, functional use of  right lower extremity and left lower extremity, improved attention, improved awareness, and improved coordination.  Patient to discharge at an ambulatory level Supervision.   Patient's care partner is independent to provide the necessary physical assistance at discharge.  Reasons goals not met: N/A  Recommendation:  Patient will benefit from ongoing skilled PT services in home health setting to continue to advance safe functional mobility, address ongoing impairments in endurance, posture, gait, and minimize fall risk.  Equipment: RW  Reasons for discharge: treatment goals met and discharge from hospital  Patient/family agrees with progress made and goals achieved: Yes  PT Discharge Discussed equipment and home routine with wife for D/C.  PT providing information for PT goals for increased confidence with pt doing well.  Precautions/Restrictions Precautions Precautions: Fall Recall of Precautions/Restrictions: Intact Precaution/Restrictions Comments: Urostomy/Colostomy Restrictions [employer] Bearing Restrictions Per Provider Order: No  Pain Pain Assessment Pain Scale: 0-10 Pain Score: 1  Pain Location: Abdomen Pain Intervention(s): Medication (See eMAR) Pain Interference Pain Interference Pain Effect on Sleep: 0. Does not apply - I have not had any pain or hurting in the past 5 days Pain Interference with Therapy Activities: 0. Does not apply - I have not received rehabilitationtherapy in the past 5 days Pain Interference with Day-to-Day Activities: 1. Rarely or not at  all Vision/Perception  Vision - History Ability to See in Adequate Light: 0 Adequate Perception Perception: Within Functional Limits Praxis Praxis: WFL  Cognition Overall Cognitive Status: Within Functional Limits for tasks assessed Arousal/Alertness: Awake/alert Orientation Level: Oriented X4 Memory: Appears intact Awareness: Appears intact Problem Solving: Appears intact Safety/Judgment: Appears intact Sensation Sensation Light Touch: Appears Intact Hot/Cold: Appears Intact Proprioception: Appears Intact Coordination Gross Motor Movements are Fluid and Coordinated: Yes Fine Motor Movements are Fluid and Coordinated: Yes Motor  Motor Motor: Within Functional Limits  Mobility Bed Mobility Bed Mobility: Rolling Right;Rolling Left;Right Sidelying to Sit;Supine to Sit;Sit to Supine;Scooting to [facility] Rolling Right: Supervision/verbal cueing Rolling Left: Supervision/Verbal cueing Right Sidelying to Sit: Supervision/Verbal cueing Supine to Sit: Supervision/Verbal cueing Sit to Supine: Supervision/Verbal cueing Scooting to HOB: Supervision/Verbal Cueing Transfers Transfers: Sit to Stand;Stand to Sit;Stand Pivot Transfers Sit to Stand: Supervision/Verbal cueing Stand to Sit: Supervision/Verbal cueing Stand Pivot Transfers: Supervision/Verbal cueing Transfer (Assistive device): Rolling walker Locomotion  Gait Ambulation: Yes Gait Assistance: Supervision/Verbal cueing Assistive device: Rolling walker Gait Assistance Details: Tactile cues for posture Gait Gait: Yes Gait Pattern: Within Functional Limits Gait velocity: decr Stairs / Additional Locomotion Stairs: Yes Stairs Assistance: Supervision/Verbal cueing Stair Management Technique: Two rails Number of Stairs: 8 Height of Stairs: 6 Ramp: Supervision/Verbal cueing Curb: Supervision/Verbal cueing Wheelchair Mobility Wheelchair Mobility: No  Trunk/Postural Assessment  Cervical Assessment Cervical Assessment:  Exceptions to [facility] Thoracic Assessment Thoracic Assessment: Exceptions to [facility] Lumbar Assessment Lumbar Assessment: Exceptions to [facility] Postural Control Postural Control: Deficits on evaluation Head Control: weak cervical extensors Righting Reactions: still somewhat delayed although better since eval Protective Responses: still somewhat delayed although better since eval  Balance Balance Balance Assessed: Yes Static Sitting Balance Static Sitting - Balance Support: Feet supported Static Sitting - Level of Assistance: 6: Modified independent (  Device/Increase time) Dynamic Sitting Balance Dynamic Sitting - Balance Support: Feet supported Dynamic Sitting - Level of Assistance: 6: Modified independent (Device/Increase time) Sitting balance - Comments: able to static sit EOB without UB support Static Standing Balance Static Standing - Balance Support: During functional activity;Bilateral upper extremity supported Static Standing - Level of Assistance: 5: Stand by assistance Dynamic Standing Balance Dynamic Standing - Balance Support: During functional activity Dynamic Standing - Level of Assistance: 5: Stand by assistance Dynamic Standing - Comments: pt able to stand without support to pull shorts up, adjust Extremity Assessment      RLE Assessment RLE Assessment: Within Functional Limits General Strength Comments: grossly 4/5 LLE Assessment LLE Assessment: Within Functional Limits General Strength Comments: grossly 4/5   [name] Fast 06/19/2024, 11:30 AM

## 2024-06-19 NOTE — Plan of Care (Signed)
  Problem: RH Balance Goal: LTG Patient will maintain dynamic standing with ADLs (OT) Description: LTG:  Patient will maintain dynamic standing balance with assist during activities of daily living (OT)  Outcome: Completed/Met   Problem: RH Bathing Goal: LTG Patient will bathe all body parts with assist levels (OT) Description: LTG: Patient will bathe all body parts with assist levels (OT) Outcome: Completed/Met   Problem: RH Dressing Goal: LTG Patient will perform upper body dressing (OT) Description: LTG Patient will perform upper body dressing with assist, with/without cues (OT). Outcome: Completed/Met Goal: LTG Patient will perform lower body dressing w/assist (OT) Description: LTG: Patient will perform lower body dressing with assist, with/without cues in positioning using equipment (OT) Outcome: Completed/Met   Problem: RH Tub/Shower Transfers Goal: LTG Patient will perform tub/shower transfers w/assist (OT) Description: LTG: Patient will perform tub/shower transfers with assist, with/without cues using equipment (OT) Outcome: Completed/Met

## 2024-06-19 NOTE — Progress Notes (Signed)
 Occupational Therapy Session Note  Patient Details  Name: FARREL GUIMOND MRN: [id number] Date of Birth: 1951-06-10  Today's Date: 06/19/2024 OT Individual Time: 9098-9074 OT Individual Time Calculation (min): 24 min    Short Term Goals: Week 3:  OT Short Term Goal 1 (Week 3): STG=LTG d/t ELOS  Skilled Therapeutic Interventions/Progress Updates:    Patient agreeable to participate in OT session. Reports no pain level.   Patient participated in skilled OT session focusing on footwear donning, DC preparation. Patient received in bed preparing to DC. OT and PT completed footwear donning of ace wraps and compression garments due to patients inability to bend. Patient able to complete bed mobility SUP A. Returned to bed all needs in reach. Therapy Documentation Precautions:  Precautions Precautions: Fall Recall of Precautions/Restrictions: Intact Precaution/Restrictions Comments: Urostomy/Colostomy Restrictions Weight Bearing Restrictions Per Provider Order: No  Therapy/Group: Individual Therapy  D'mariea L Stephinie Battisti 06/19/2024, 10:14 AM

## 2024-06-19 NOTE — Progress Notes (Signed)
 Physical Therapy Session Note  Patient Details  Name: Kristopher Thompson MRN: [id number] Date of Birth: Nov 28, 1950  Today's Date: 06/19/2024 PT Individual Time: 1125-1140 PT Individual Time Calculation (min): 15 min   Short Term Goals: Week 3:     Skilled Therapeutic Interventions/Progress Updates: Pt presents semi-reclined in bed and receiving orders from NP.  Pt agreeable to transferring OOB and into w/c for D/C.  Pt required supervision and increased time for transfer sup to sit and then scoots to EOB.  Pt donned shoes w/ set-up.  Pt transferred sit to stand w/ supervision w/ slightly elevated bed height.  Pt steps to w/c w/ RW and supervision.  Pt educated on breathing technique to avoid dizziness.  Leg rests adjusted for length for comfort on personal w/c.  Pt remained sitting in w/c w/ all needs in reach, spouse present.  Missed time of 30' 2/2 D/C.     Therapy Documentation Precautions:  Precautions Precautions: Fall Recall of Precautions/Restrictions: Intact Precaution/Restrictions Comments: Urostomy/Colostomy Restrictions Weight Bearing Restrictions Per Provider Order: No General: PT Amount of Missed Time (min): 30 Minutes PT Missed Treatment Reason: Other (Comment) (Pt being D/C'd.) Vital Signs:   Pain: Pain Assessment Pain Scale: 0-10 Pain Score: 1  Pain Location: Abdomen Pain Intervention(s): Medication (See eMAR) Mobility: Bed Mobility Bed Mobility: Rolling Right;Rolling Left;Right Sidelying to Sit;Supine to Sit;Sit to Supine;Scooting to [facility] Rolling Right: Supervision/verbal cueing Rolling Left: Supervision/Verbal cueing Right Sidelying to Sit: Supervision/Verbal cueing Supine to Sit: Supervision/Verbal cueing Sit to Supine: Supervision/Verbal cueing Scooting to HOB: Supervision/Verbal Cueing Transfers Transfers: Sit to Stand;Stand to Sit;Stand Pivot Transfers Sit to Stand: Supervision/Verbal cueing Stand to Sit: Supervision/Verbal cueing Stand Pivot  Transfers: Supervision/Verbal cueing Transfer (Assistive device): Rolling walker Locomotion : Gait Ambulation: Yes Gait Assistance: Supervision/Verbal cueing Assistive device: Rolling walker Gait Assistance Details: Tactile cues for posture Gait Gait: Yes Gait Pattern: Within Functional Limits Gait velocity: decr Stairs / Additional Locomotion Stairs: Yes Stairs Assistance: Supervision/Verbal cueing Stair Management Technique: Two rails Number of Stairs: 8 Height of Stairs: 6 Ramp: Supervision/Verbal cueing Curb: Supervision/Verbal cueing Wheelchair Mobility Wheelchair Mobility: No  Trunk/Postural Assessment : Cervical Assessment Cervical Assessment: Exceptions to [facility] Thoracic Assessment Thoracic Assessment: Exceptions to [facility] Lumbar Assessment Lumbar Assessment: Exceptions to [facility] Postural Control Postural Control: Deficits on evaluation Head Control: weak cervical extensors Righting Reactions: still somewhat delayed although better since eval Protective Responses: still somewhat delayed although better since eval  Balance: Balance Balance Assessed: Yes Static Sitting Balance Static Sitting - Balance Support: Feet supported Static Sitting - Level of Assistance: 6: Modified independent (Device/Increase time) Dynamic Sitting Balance Dynamic Sitting - Balance Support: Feet supported Dynamic Sitting - Level of Assistance: 6: Modified independent (Device/Increase time) Sitting balance - Comments: able to static sit EOB without UB support Static Standing Balance Static Standing - Balance Support: During functional activity;Bilateral upper extremity supported Static Standing - Level of Assistance: 5: Stand by assistance Dynamic Standing Balance Dynamic Standing - Balance Support: During functional activity Dynamic Standing - Level of Assistance: 5: Stand by assistance Dynamic Standing - Comments: pt able to stand without support to pull shorts up, adjust Exercises:    Other Treatments:      Therapy/Group: Individual Therapy  Ashir Kunz P Kizzie Cotten 06/19/2024, 11:43 AM

## 2024-06-19 NOTE — Progress Notes (Signed)
 Inpatient Rehabilitation Care Coordinator Discharge Note   Patient Details  Name: Kristopher Thompson MRN: [id number] Date of Birth: 03-22-1951   Discharge location: HOME WITH WIFE WHO IS ABLE TO PROVIDE 24/7 CARE AND WAS HERE DAILY PARTICIPATING IN HIS CARE  Length of Stay: 23 DAYS  Discharge activity level: SUPERVISION WITH CUES  Home/community participation: ACTIVE  Patient response un:Yzjouy Literacy - How often do you need to have someone help you when you read instructions, pamphlets, or other written material from your doctor or pharmacy?: Never  Patient response un:Dnrpjo Isolation - How often do you feel lonely or isolated from those around you?: Never  Services provided included: MD, RD, PT, OT, SLP, RN, CM, TR, Pharmacy, Neuropsych, SW  Financial Services:  Field seismologist Utilized: Medicare    Choices offered to/list presented to: PT AND WIFE  Follow-up services arranged:  Home Health, DME Home Health Agency: [facility]  PT &  RN    DME : [facility] HAS OTHER NEEDED EQUIPMENT FROM PAST ADMISSIONS    Patient response to transportation need: Is the patient able to respond to transportation needs?: Yes In the past 12 months, has lack of transportation kept you from medical appointments or from getting medications?: No In the past 12 months, has lack of transportation kept you from meetings, work, or from getting things needed for daily living?: No   Patient/Family verbalized understanding of follow-up arrangements:  Yes  Individual responsible for coordination of the follow-up plan: DEBBIE-WIFE [id number]  Confirmed correct DME delivered: Raymonde Asberry MATSU 06/19/2024    Comments (or additional information): WIFE WAS HERE DAILY AND PARTICIPATED IN ALL ASPECTS OF HIS CARE-THERAPIES AND WOUND CARE. BOTH FEEL READY TO GO HOME TODAY. PT FEELS WILL DRINK MORE DUE TO THEY HAVE WELL WATER  AND HE LIKES IT BETTER THAN THE WATER  HERE. AWARE WILL  BE FOLLOWING UP WITH MD AND THEY WILL DETERMINE BASED UPON LABS IF WILL NEED INFUSION-FLUID AND WILL SET UP WITH OUTPATIENT INFUSION  Summary of Stay    Date/Time Discharge Planning CSW  06/16/24 9142 Wife is here daily and participating in husband's therapies. 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 Nephrostomy tube care needs but surgical tubes pending and wound vac for home. DBS       Lakisa Lotz G

## 2024-06-19 NOTE — Progress Notes (Signed)
 PROGRESS NOTE   Subjective/Complaints:   Pt wants to leave tomorrow.   Went over labs- BUN/Cr up slightly- however I think this will occur- my hope is that it was stabilize esp when pt back and home and can drink his well water , which he likes a lot more- I've asked him to see PCP in the next week for labs-and explained we cannot get PICC to send home on IVFs nor does his PCP do infusions of IVFs in the office- if Surgeon feels he needs IVFs, after get labs next week, suggest getting him to infusion center possibly?  Also, d/w pt and wife about don't change doing TEDs and ACE wraps when up/moving around, going to Doctor for 3 weeks- check BP if feels dizzy/lightheaded, but not if feels good.     ROS:    Pt denies SOB, abd pain, CP, N/V/C/D, and vision changes   Per HPI- tachycardia is improving some  Objective:   DG Loopogram Result Date: 06/17/2024 INDICATION: Patient with a history of a radical cystectomy/ileal conduit urinary diversion with a known leak of the ileal conduit with bilateral nephrostomy tubes placed 05/04/24 as well as placement of a right retrograde ureteral stent 05/14/24. Request for loopogram to assess for further evidence of urinary leak. COMPARISON:  DG LOOPOGRAM 05/12/24 CONTRAST:  A total of 60 mL Isovue -300 administered was administered into the bilateral nephrostomy tubes as well as retrograde ureteral stent. FLUOROSCOPY TIME:  21 mGy COMPLICATIONS: None immediate. TECHNIQUE: Informed written consent was obtained from the patient after a discussion of the risks, benefits and alternatives to treatment. Questions regarding the procedure were encouraged and answered. A timeout was performed prior to the initiation of the procedure. A pre procedural spot fluoroscopic image was obtained. The bilateral flank and external portions of existing nephrostomy catheters and right ureteral stent were prepped and sterilized.  Antegrade injection of 20 mL of contrast into the existing right nephrostomy catheter demonstrated appropriate positioning within the renal pelvis. After several minutes, no contrast was identified within the right ureter or ileal conduit. Antegrade injection of 20 mL of contrast into the existing left nephrostomy catheter demonstrated appropriate positioning within the renal pelvis. After several minutes, a small volume of contrast was noted to flow through the left ureter into the ileal conduit. Retrograde injection of the right ureteral stent opacified the right ureter and right renal collecting system. No contrast extravasation observed. Temporary dressing applied to the ileal conduit ostomy site. Bilateral nephrostomy tubes were capped as requested by ordering MD. The patient tolerated the procedure well without immediate postprocedural complication. FINDINGS: The existing right nephrostomy catheter is appropriately positioned and functioning. Right retrograde ureteral stent in place through ileal conduit. IMPRESSION: 1. Right nephrostogram via the right nephrostomy tube and right ureteral stent demonstrates normally patent ureter and ureteral anastomosis with the ileal conduit. No right-sided urine leak demonstrated. 2. Left nephrostogram through left nephrostomy tube appears in good position with evidence of contrast flow to the distal ureter. No left-sided urine leak demonstrated. Performed by: Kacie Matthews PA-C Electronically Signed   By: Ester Sides M.D.   On: 06/17/2024 16:00   CT ABDOMEN PELVIS W WO CONTRAST Result Date:  06/17/2024 CLINICAL DATA:  Abdominal pain, postop, ileal conduit leak. EXAM: CT ABDOMEN AND PELVIS WITHOUT AND WITH CONTRAST TECHNIQUE: Multidetector CT imaging of the abdomen and pelvis was performed following the standard protocol before and following the bolus administration of intravenous contrast. RADIATION DOSE REDUCTION: This exam was performed according to the departmental  dose-optimization program which includes automated exposure control, adjustment of the mA and/or kV according to patient size and/or use of iterative reconstruction technique. CONTRAST:  75mL OMNIPAQUE  IOHEXOL  350 MG/ML SOLN COMPARISON:  05/12/2024. FINDINGS: Lower chest: A few tiny pulmonary nodules are unchanged from 08/29/2018 and are considered benign. Heart size normal. No pericardial effusion. No pleural effusion. Distal esophagus is grossly unremarkable. Hepatobiliary: Liver is grossly unremarkable. Cholecystectomy. No biliary ductal dilatation. Pancreas: Chronic calcific pancreatitis. 1.3 cm low-attenuation lesion in the pancreatic tail (3/21), unchanged from 08/29/2018, compatible with a pseudocyst. Spleen: Negative. Adrenals/Urinary Tract: Negative. Stomach/Bowel: Bilateral percutaneous nephrostomies with contrast in the collecting systems bilaterally, secondary to a loopogram performed prior to this study. Possible small stone in the right kidney (3/24). Persistent moderate to severe dilatation of the right intrarenal collecting system and right ureter with a double-J right ureteral stent in place, proximal loop in the right renal pelvis and distal portion the on the right lower quadrant ileal conduit. Left kidney is atrophic and scarred. Partial opacification of the left ureter which is decompressed. Ileal conduit in the right lower quadrant which may be stenotic as it traverses the abdominal wall, as on 05/12/2024. No extraluminal contrast. Cysto prostatectomy. Vascular/Lymphatic: Stomach is unremarkable. Right lower quadrant ileostomy and separate ileal conduit. Small bowel and colon are otherwise unremarkable. Appendix is not readily visualized. Reproductive: Prostatectomy. Other: Small right inguinal hernia contains fat. Enlarging broad fluid collection along the right ventral pelvic wall, just above the ileal conduit, measuring 3.4 x 21.6 cm. A percutaneous drain is seen within. No free fluid or  extraluminal contrast. Musculoskeletal: Degenerative changes in the spine. Old L1 compression fracture. IMPRESSION: 1. 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 2. Enlarging fluid collection along the right ventral pelvic abdominal wall with a percutaneous drain in place, indicative of an abscess. 3. 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 4. Right lower quadrant ileostomy. 5. Probable small right renal stone. 6. Chronic calcific pancreatitis. Electronically Signed   By: Newell Eke M.D.   On: 06/17/2024 15:47    Recent Labs    06/18/24 0341  WBC 8.9  HGB 10.7*  HCT 33.2*  PLT 275    Recent Labs    06/18/24 0341 06/19/24 0449  NA 134* 135  K 3.4* 3.6  CL 110 110  CO2 17* 17*  GLUCOSE 90 93  BUN 26* 28*  CREATININE 1.27* 1.33*  CALCIUM  9.3 9.7    Intake/Output Summary (Last 24 hours) at 06/19/2024 0831 Last data filed at 06/19/2024 9360 Gross per 24 hour  Intake 236 ml  Output 1025 ml  Net -789 ml        Physical Exam: Vital Signs Blood pressure 127/69, pulse 95, temperature 98.5 F (36.9 C), temperature source Oral, resp. rate 19, height 5' 11 (1.803 m), weight 91.9 kg, SpO2 98%.       General: awake, alert, appropriate, sitting up in bed; wife at bedside; NAD HENT: conjugate gaze; oropharynx moist CV: regular rate and rhythm- rate in 90's; no JVD Pulmonary: CTA B/L; no W/R/R- good air movement  GI: soft, NT, ND, (+)BS- abd wound covered C/D/I; and ostomies and drains intact Psychiatric: appropriate- flat Neurological: Ox3  Skin- wife changing ostomies- look great- and abd wound more superficial on exam   Extremities: No LE edema B/L    Assessment/Plan: 1. Functional deficits which require 3+ hours per day of interdisciplinary therapy in a comprehensive inpatient rehab setting. Physiatrist is providing close team  supervision and 24 hour management of active medical problems listed below. Physiatrist and rehab team continue to assess barriers to discharge/monitor patient progress toward functional and medical goals  Care Tool:  Bathing    Body parts bathed by patient: Right arm, Left arm, Chest, Abdomen, Front perineal area, Right upper leg, Left upper leg, Face, Buttocks, Left lower leg, Right lower leg   Body parts bathed by helper: Buttocks, Right lower leg, Left lower leg     Bathing assist Assist Level: Supervision/Verbal cueing     Upper Body Dressing/Undressing Upper body dressing   What is the patient wearing?: Pull over shirt    Upper body assist Assist Level: Set up assist    Lower Body Dressing/Undressing Lower body dressing      What is the patient wearing?: Pants     Lower body assist Assist for lower body dressing: Supervision/Verbal cueing     Toileting Toileting Toileting Activity did not occur (Clothing management and hygiene only): N/A (no void or bm) (urostomy and colostomy)  Toileting assist       Transfers Chair/bed transfer  Transfers assist     Chair/bed transfer assist level: Supervision/Verbal cueing     Locomotion Ambulation   Ambulation assist      Assist level: Supervision/Verbal cueing Assistive device: Walker-rolling Max distance: 200'   Walk 10 feet activity   Assist  Walk 10 feet activity did not occur: Safety/medical concerns (pt too weak, fatigue)  Assist level: Supervision/Verbal cueing Assistive device: Walker-rolling   Walk 50 feet activity   Assist Walk 50 feet with 2 turns activity did not occur: Safety/medical concerns (pt too weak, fatigue)  Assist level: Supervision/Verbal cueing Assistive device: Walker-rolling    Walk 150 feet activity   Assist Walk 150 feet activity did not occur: Safety/medical concerns (pt too weak, fatigue)  Assist level: Supervision/Verbal cueing Assistive device: Walker-rolling     Walk 10 feet on uneven surface  activity   Assist Walk 10 feet on uneven surfaces activity did not occur: Safety/medical concerns (pt too weak, fatigue)   Assist level: Supervision/Verbal cueing Assistive device: Walker-rolling   Wheelchair     Assist Is the patient using a wheelchair?: Yes Type of Wheelchair: Manual    Wheelchair assist level: Supervision/Verbal cueing Max wheelchair distance: 155'    Wheelchair 50 feet with 2 turns activity    Assist    Wheelchair 50 feet with 2 turns activity did not occur:  (pt too weak, fatigue)   Assist Level: Supervision/Verbal cueing   Wheelchair 150 feet activity     Assist  Wheelchair 150 feet activity did not occur:  (pt too weak, fatigue)   Assist Level: Supervision/Verbal cueing   Blood pressure 127/69, pulse 95, temperature 98.5 F (36.9 C), temperature source Oral, resp. rate 19, height 5' 11 (1.803 m), weight 91.9 kg, SpO2 98%.  Medical Problem List and Plan: 1. Functional deficits secondary to debility 2/2 perforated bowel             -patient may shower              -  ELOS/Goals: 10-14 days S            D/c 06/19/24  Pt's OH is really limiting therapy as well as nausea Wife to get products ot help leakage from ostomies   D/w Dr Sheldon- doesn't have abscess- is chronic seroma- should improve as Albumin  rises- has drain in it  - looking to cap drains- and then f/u with Urology in 1-2 weeks after d/c and f/u with Dr Sheldon- also needs PCP appt ASAP after d/c  D/c today pt is ok with d/c.  2.  Antithrombotics: -DVT/anticoagulation:  Mechanical:  Antiembolism stockings, knee (TED hose) Bilateral lower extremities             -antiplatelet therapy: continue Eliquis  3. Pain Management: continue Scheduled Tylenol ; Oxycodone  as needed took 30mg  Oxy IR yesterday , 20mg  thus far today    7/17- took tylenol  this AM- pain down to 2-3/10- will con't regimen and monitor if needs more with therapy  7/22- Will change  tylenol  to 500 mg BID and con't oxy prn  7/23- hasn't required pain meds overnight- doing better  7/24-27- pain control improved  7/28- pain doing well except sides burning this AM- but better than usual  7/29-8/1- taking pain meds ~ q6 hours per last 24 hours  8/5- not taking pain meds- wants to go home with 1 week/20 pills at d/c- which I agree with 4. Mood/Behavior/Sleep: LCSW to follow for evaluation and support when available.              -antipsychotic agents: N/A             -anxiety: continue Atarax  25 mg   7/29- added Zoloft   8/3- Brighter affect  8/5- more flat today- will increase Zoloft   5. Neuropsych/cognition: This patient is capable of making decisions on his own behalf.   6. Complicated wound: WOC nurse consultation             - Continue wound VAC             Zosyn  d/ced as per surgery recommendation   7/26-7 hydrogel and gauze dressing in place. Midline abd wound looks great 7. Fluids/Electrolytes/Nutrition: Monitor I's & O's--Recheck monitor labs routinely CBC/CMP              - Bowel regimen for ileostomy: Continue FiberCon and loperamide  twice daily Goal is [phone] mL out fecal ileostomy a day              - Carb/cardiac diet -continue Juven/Prosource/Ensure supplements              -Nausea/vomiting:  seems to have improved- PRN orders Reglan  and Zofran  7/24- made Reglan  scheduled 5 mg TID-AC- and nausea is somewhat better this AM 7/26-27  nausea seems a bit better.   -doesn't have much appetite but is trying to eat -ate 100-100-25% yesterday 7/28- still nausea- no change with med changes-   7/31- isn't eating well- Albumin  down to 2.5 from 3.3 2 weeks ago.  He's on Prosource 2x/day and supplements 2x/day with max protein- will d/w pt if drinking them 8/7- Albumin  stable at 2.5! 8. AKI/CKD stage IIIb: Creatinine improving 2.3 now 1.91 -monitor BMP   7/17- Cr 2.05- on IVF's 125/cc/hour- will continue for now and recheck labs in AM             - On continuous IVF  NS 125 mL/hr   7/18- Will reduce IVFs to 75cc/hour-     Latest Ref Rng &  Units 06/19/2024    4:49 AM 06/18/2024    3:41 AM 06/15/2024    4:02 AM  BMP  Glucose 70 - 99 mg/dL 93  90  898   BUN 8 - 23 mg/dL 28  26  36   Creatinine 0.61 - 1.24 mg/dL 8.66  8.72  8.58   Sodium 135 - 145 mmol/L 135  134  133   Potassium 3.5 - 5.1 mmol/L 3.6  3.4  3.9   Chloride 98 - 111 mmol/L 110  110  105   CO2 22 - 32 mmol/L 17  17  18    Calcium  8.9 - 10.3 mg/dL 9.7  9.3  89.5     2/79- reduce IVF to 50cc/hr Recent ECHO showed normal Ej fx, no concerns or signs of fluid overload at this time   7/22- will stop IVF's- IV is leaking and pt wants to try off it.   7/23- labs in AM  7/24- Cr up to 1.28- from 1.08- will recheck Monday- is up a little because pt not drinking as much, will push fluids still  7/27 BUN/Cr   back up yesterday   -continue IVF tonight.   7/28- BUN/Cr 22 and 1.24  7/31- Pt's BUN is down to 23; but Cr up slightly to 1.36- in spite of IVFs- I'm concerned will go home and get dehydrated- we discussed NEEDS to drink more than 8 cups/day.   8/1-  Will stop IVFs for now, because nursing reports pt has wet sounding lungs- will recheck labs in AM  8/2- Cr better- but BUN slightly up more to 25- will check labs again in AM to monitor if need to go back on IVFs- pt pushing to drink 6-8 cups/day  8/3- labs in AM  8/4 Doesn't appear to have much oral fluid intake although nursing/therapy encouraging, Bun and Cr up to 1.41/36, will start NS 50/hr  8/5- cannot go home on PICC with IVF's- not allowed by insurance and medically too high risk to get PICC when can drink  8/6- will try again to stop IVFs- and will check labs in AM  8/7- Cr 1.27 and BUN 26-will recheck in AM before d/c- and maybe might need to go to PCP's to be tanked up a few times per week if necessary?  8/8- see PCP next week and get labs- check BP only if feels bad-  9. DM type II: 04/01/24 Hemoglobin A1c 5.3  -Monitor BS AC/at bedtime and  use SSI 7/17- will change to regular diet and try to have pt make good choices- since poor intake- CBG's low 7/18- BG's 84-125- well controlled- con't regimen- probably low due to poor intake.  CBG (last 3)  Recent Labs    06/18/24 1624 06/18/24 2011 06/19/24 0606  GLUCAP 115* 114* 96    8/3- still good control- con't regimen 8/6 -8/7 cbgs controlled overall- look good 10.  Severe Hypotension: Order orthostatic vitals              - teds ordered             -increase midodrine  to 10mg  TID   7/17- BP running 80s systolic and feels lightheaded- Midodrine  was increased yesterday to 10 mg TID- if doesn't improve today, will order ACE wraps as well as Thigh high TED's- cannot do abd binder due to abd drains, VAC and ostomies- if this doesn't improve in next 24 hours, will add Florinef .  Also liberalized his diet to regular diet so can  have some salt to help.   7/18- will add Florinef  0.1 mg daily- advised therapy to add ACE wraps- wasn't started yesterday- pt IS symptomatic- will also continue IVFs for now Vitals:   06/18/24 2025 06/19/24 0447  BP: 113/64 127/69  Pulse: 89 95  Resp: 19 19  Temp: 98.6 F (37 C) 98.5 F (36.9 C)  SpO2: 98% 98%  Overall improved since 7/16 but remains soft cont IVF but at lower rate   7/22- Stopped IVFs since IV is leaking- pt wants ot get rid of IVF's- pt needs to drink 6-8 cups of milk juice or water /day- if labs get worse on Thursday, will restart IVFs. BP's running low 100's systolic- better than before.   7/23- per pt, less [facility]  7/24- still having OH per therapy- will d/w PT/OT before changing meds  7/25- BP 70/s40s standing this AM with ACE wraps and TEDs to thighs- will increase Florinef  to 0.2 mg since has been 1 week since started- will also have everyone push fluids  7/27 sitting and lying readings were almost equal yesterday  7/30- pt's BP dropped to 90s/60s this AM- so will con't TEDs and ACE wraps  7/31- Not dizzy with sitting EOB this AM- does  feel a little weak- asked PT to f/u on BP's  8/1- pt didn't AM midodrine - til 10:15- he had difficulty with therapy- increased Midodrine  to 15 mg TID  8/2- went over timing with wife and pt- is correct at 0630, 1030 and 230 pm  8/2- HR running low 100's appeared to get worse around 7/28, however no signs of PE other than tachycardia AND on Eliquis  5mg  BID so very unlikely- probably due to brewing UTI that's improving-   8/4 restart IVF NS as above, continue to encourage oral fluids   8/5- Was less orthostatic yesterday  8/6- 8/7no tachycardia at rest this AM- getting better- able to place own TEDs now with wife's help 11. Stage I Obesity: BMI 31.49 kg/m  -educate on diet and weight loss to promote overall health and mobility.   7/17- BMI down to 28.44 7/22- BMI 28.44 7/28- will ask nursing to check weight 7/31- will ask nursing to add Daily weights- concerned he's losing weight when doesn't need to right now 8/2- BMI 28.26- keeping pretty stable 12. Hyperlipidemia: Continue Crestor    13. Screening for vitamin D  deficiency:check vitamin D  level tomorrow 14. Hyponatremia  7/17- Na down to 128- but is on IVF's- hard to fluid restrict due to Renal issues, so will keep on IVF's and recheck in AM  7/18- Na up to 134- will not intervene more  7/22- Na up to 136  7/26- Na 135  7/31- Na 131  8/2- Na up to 133  8/4 Na stable 133 15. Poor appetite, nausea- frequent  7/18- will schedule low dose Compazine  before meals TID due to frequent nausea impacting his ability to eat meals. 7/22- still has severe loss of appetite, even though nausea is doing somewhat better- will Add Periacin 4 mg at bedtime- cannot add Remeron due to prolonged QT interval  7/23- will schedule Reglan  5 mg TID-AC to also help with nausea . 7/24- better appetite and nausea this AM- ate 2 bowls cheerios! Making gains 7/25- Will increase Compazine  to 10 mg before meals and changed toming of Reglan  to get 1 hour before meals as  well   7/27--ate 100-100-25% yesterday   -he's trying!   -encouraged wife to bring in food from home/outside   -continue  periactin   7/28- no improvement in nausea- will d/w team  7/29- spoke with pharmacy- cannot add Remeron- due to prolonged QT interval and already on reglan -   7/30- added Zoloft  yesterday- will monitor  7/31- nausea worse in evening with dinner- Albumin  is dropping- on A lot of supplements- not sure he's taking them- will double with wife. 8/3- ate 1.5 bowls of cheerios this AM  8/5- pt up 8 lbs in 1 day- will double check with nursing. 8/6-  weight 90.3 kg today- now just 4 lbs up from 2 days ago- - we discussed needing to drink 80 oz/day or so to get the fluids he needs and to drink with Mio, etc- and get bottle that shows how much he's actually drinking. 8/7- Weight up 3 kg?? Don't see how- will have nursing recheck- ate 2 bowls this Am of cheerios- nausea is better per pt- still poor appetite- not getting much better, but gorggy in AM, so cannot increase Periactin   16. UTI- waiting for Urine Cx  8/1-  Has UTI- started Keflex  500 mg BID due to renal impairment-  and waiting for U Cx- reordered, just in case.   8/2- don't see any results so far for Cx  8/3- >100k GNR- still pending  8/4 urine growing Klebsiella pneumonia and Citrobacter freundii, change abx to levofloxacin  250mg  po daily for 5 days  17. Hypokalemia-  8/7-will replete 40 mEq x1.    I spent a total of 41   minutes on total care today- >50% coordination of care- due to  D/w pt and wife as above- measure intake  as well  The patient is medically ready for discharge to home and will not need follow-up with [facility] PM[facility]. In addition, they will need to follow up with their PCP, F/U with Urology and General surgery- Dr Sheldon and f/u with PCP in next 1 week.    LOS: 23 days A FACE TO FACE EVALUATION WAS PERFORMED  Kristopher Thompson 06/19/2024, 8:31 AM

## 2024-06-19 NOTE — Progress Notes (Signed)
 Discussed with Dr. Sheldon about continuing IVF outpatient, it was determined that IVFs can be discontinued. He will follow up outpatient.   Kristopher Finders, FNP Physical Medicine & Rehabilitation

## 2024-07-01 ENCOUNTER — Other Ambulatory Visit: Payer: Self-pay

## 2024-07-01 ENCOUNTER — Inpatient Hospital Stay (HOSPITAL_COMMUNITY)

## 2024-07-01 ENCOUNTER — Inpatient Hospital Stay (HOSPITAL_COMMUNITY)
Admission: EM | Admit: 2024-07-01 | Discharge: 2024-07-11 | DRG: 919 | Disposition: A | Discharge status: Rehabilitation Facility | Attending: Family Medicine | Admitting: Family Medicine

## 2024-07-01 ENCOUNTER — Emergency Department (HOSPITAL_COMMUNITY)

## 2024-07-01 DIAGNOSIS — L02211 Cutaneous abscess of abdominal wall: Secondary | ICD-10-CM | POA: Diagnosis present

## 2024-07-01 DIAGNOSIS — Z932 Ileostomy status: Secondary | ICD-10-CM | POA: Diagnosis not present

## 2024-07-01 DIAGNOSIS — Z7901 Long term (current) use of anticoagulants: Secondary | ICD-10-CM | POA: Diagnosis not present

## 2024-07-01 DIAGNOSIS — E43 Unspecified severe protein-calorie malnutrition: Secondary | ICD-10-CM | POA: Diagnosis present

## 2024-07-01 DIAGNOSIS — D631 Anemia in chronic kidney disease: Secondary | ICD-10-CM | POA: Diagnosis present

## 2024-07-01 DIAGNOSIS — D62 Acute posthemorrhagic anemia: Secondary | ICD-10-CM | POA: Diagnosis present

## 2024-07-01 DIAGNOSIS — R6520 Severe sepsis without septic shock: Secondary | ICD-10-CM | POA: Diagnosis present

## 2024-07-01 DIAGNOSIS — A419 Sepsis, unspecified organism: Principal | ICD-10-CM | POA: Diagnosis present

## 2024-07-01 DIAGNOSIS — I130 Hypertensive heart and chronic kidney disease with heart failure and stage 1 through stage 4 chronic kidney disease, or unspecified chronic kidney disease: Secondary | ICD-10-CM | POA: Diagnosis present

## 2024-07-01 DIAGNOSIS — I5032 Chronic diastolic (congestive) heart failure: Secondary | ICD-10-CM | POA: Diagnosis present

## 2024-07-01 DIAGNOSIS — F419 Anxiety disorder, unspecified: Secondary | ICD-10-CM | POA: Diagnosis present

## 2024-07-01 DIAGNOSIS — Z6828 Body mass index (BMI) 28.0-28.9, adult: Secondary | ICD-10-CM

## 2024-07-01 DIAGNOSIS — E872 Acidosis, unspecified: Secondary | ICD-10-CM | POA: Diagnosis present

## 2024-07-01 DIAGNOSIS — E871 Hypo-osmolality and hyponatremia: Secondary | ICD-10-CM | POA: Diagnosis present

## 2024-07-01 DIAGNOSIS — G4700 Insomnia, unspecified: Secondary | ICD-10-CM | POA: Diagnosis present

## 2024-07-01 DIAGNOSIS — E1122 Type 2 diabetes mellitus with diabetic chronic kidney disease: Secondary | ICD-10-CM | POA: Diagnosis present

## 2024-07-01 DIAGNOSIS — R4189 Other symptoms and signs involving cognitive functions and awareness: Secondary | ICD-10-CM | POA: Diagnosis not present

## 2024-07-01 DIAGNOSIS — Z1611 Resistance to penicillins: Secondary | ICD-10-CM | POA: Diagnosis present

## 2024-07-01 DIAGNOSIS — Y832 Surgical operation with anastomosis, bypass or graft as the cause of abnormal reaction of the patient, or of later complication, without mention of misadventure at the time of the procedure: Secondary | ICD-10-CM | POA: Diagnosis present

## 2024-07-01 DIAGNOSIS — E440 Moderate protein-calorie malnutrition: Secondary | ICD-10-CM | POA: Diagnosis not present

## 2024-07-01 DIAGNOSIS — Z8249 Family history of ischemic heart disease and other diseases of the circulatory system: Secondary | ICD-10-CM

## 2024-07-01 DIAGNOSIS — R627 Adult failure to thrive: Secondary | ICD-10-CM | POA: Diagnosis present

## 2024-07-01 DIAGNOSIS — T8579XA Infection and inflammatory reaction due to other internal prosthetic devices, implants and grafts, initial encounter: Secondary | ICD-10-CM | POA: Diagnosis present

## 2024-07-01 DIAGNOSIS — R5381 Other malaise: Secondary | ICD-10-CM | POA: Diagnosis present

## 2024-07-01 DIAGNOSIS — Z8546 Personal history of malignant neoplasm of prostate: Secondary | ICD-10-CM

## 2024-07-01 DIAGNOSIS — E860 Dehydration: Secondary | ICD-10-CM | POA: Diagnosis present

## 2024-07-01 DIAGNOSIS — Z87891 Personal history of nicotine dependence: Secondary | ICD-10-CM

## 2024-07-01 DIAGNOSIS — D696 Thrombocytopenia, unspecified: Secondary | ICD-10-CM | POA: Diagnosis not present

## 2024-07-01 DIAGNOSIS — Z8551 Personal history of malignant neoplasm of bladder: Secondary | ICD-10-CM

## 2024-07-01 DIAGNOSIS — N189 Chronic kidney disease, unspecified: Secondary | ICD-10-CM | POA: Diagnosis not present

## 2024-07-01 DIAGNOSIS — N178 Other acute kidney failure: Secondary | ICD-10-CM | POA: Diagnosis not present

## 2024-07-01 DIAGNOSIS — G9341 Metabolic encephalopathy: Secondary | ICD-10-CM | POA: Diagnosis present

## 2024-07-01 DIAGNOSIS — N136 Pyonephrosis: Secondary | ICD-10-CM | POA: Diagnosis present

## 2024-07-01 DIAGNOSIS — I2510 Atherosclerotic heart disease of native coronary artery without angina pectoris: Secondary | ICD-10-CM | POA: Diagnosis present

## 2024-07-01 DIAGNOSIS — G7281 Critical illness myopathy: Secondary | ICD-10-CM | POA: Diagnosis not present

## 2024-07-01 DIAGNOSIS — N1832 Chronic kidney disease, stage 3b: Secondary | ICD-10-CM | POA: Diagnosis present

## 2024-07-01 DIAGNOSIS — Z6832 Body mass index (BMI) 32.0-32.9, adult: Secondary | ICD-10-CM | POA: Diagnosis not present

## 2024-07-01 DIAGNOSIS — H9190 Unspecified hearing loss, unspecified ear: Secondary | ICD-10-CM

## 2024-07-01 DIAGNOSIS — L89313 Pressure ulcer of right buttock, stage 3: Secondary | ICD-10-CM | POA: Diagnosis present

## 2024-07-01 DIAGNOSIS — Z86711 Personal history of pulmonary embolism: Secondary | ICD-10-CM | POA: Diagnosis present

## 2024-07-01 DIAGNOSIS — R69 Illness, unspecified: Secondary | ICD-10-CM | POA: Diagnosis not present

## 2024-07-01 DIAGNOSIS — N9989 Other postprocedural complications and disorders of genitourinary system: Secondary | ICD-10-CM | POA: Diagnosis present

## 2024-07-01 DIAGNOSIS — F4321 Adjustment disorder with depressed mood: Secondary | ICD-10-CM | POA: Diagnosis present

## 2024-07-01 DIAGNOSIS — Z906 Acquired absence of other parts of urinary tract: Secondary | ICD-10-CM

## 2024-07-01 DIAGNOSIS — Z936 Other artificial openings of urinary tract status: Secondary | ICD-10-CM | POA: Diagnosis not present

## 2024-07-01 DIAGNOSIS — Z9079 Acquired absence of other genital organ(s): Secondary | ICD-10-CM

## 2024-07-01 DIAGNOSIS — N1330 Unspecified hydronephrosis: Secondary | ICD-10-CM | POA: Diagnosis present

## 2024-07-01 DIAGNOSIS — Z85828 Personal history of other malignant neoplasm of skin: Secondary | ICD-10-CM

## 2024-07-01 DIAGNOSIS — K219 Gastro-esophageal reflux disease without esophagitis: Secondary | ICD-10-CM | POA: Diagnosis present

## 2024-07-01 DIAGNOSIS — A4151 Sepsis due to Escherichia coli [E. coli]: Secondary | ICD-10-CM | POA: Diagnosis present

## 2024-07-01 DIAGNOSIS — E876 Hypokalemia: Secondary | ICD-10-CM | POA: Diagnosis not present

## 2024-07-01 DIAGNOSIS — N179 Acute kidney failure, unspecified: Secondary | ICD-10-CM | POA: Diagnosis present

## 2024-07-01 DIAGNOSIS — Y732 Prosthetic and other implants, materials and accessory gastroenterology and urology devices associated with adverse incidents: Secondary | ICD-10-CM | POA: Diagnosis not present

## 2024-07-01 DIAGNOSIS — Z9049 Acquired absence of other specified parts of digestive tract: Secondary | ICD-10-CM

## 2024-07-01 DIAGNOSIS — C679 Malignant neoplasm of bladder, unspecified: Secondary | ICD-10-CM | POA: Diagnosis present

## 2024-07-01 DIAGNOSIS — Z801 Family history of malignant neoplasm of trachea, bronchus and lung: Secondary | ICD-10-CM

## 2024-07-01 DIAGNOSIS — E2740 Unspecified adrenocortical insufficiency: Secondary | ICD-10-CM | POA: Diagnosis present

## 2024-07-01 DIAGNOSIS — N139 Obstructive and reflux uropathy, unspecified: Secondary | ICD-10-CM | POA: Diagnosis present

## 2024-07-01 DIAGNOSIS — B9689 Other specified bacterial agents as the cause of diseases classified elsewhere: Secondary | ICD-10-CM | POA: Diagnosis not present

## 2024-07-01 DIAGNOSIS — F32A Depression, unspecified: Secondary | ICD-10-CM | POA: Diagnosis present

## 2024-07-01 DIAGNOSIS — K631 Perforation of intestine (nontraumatic): Secondary | ICD-10-CM | POA: Diagnosis present

## 2024-07-01 DIAGNOSIS — R1312 Dysphagia, oropharyngeal phase: Secondary | ICD-10-CM | POA: Diagnosis not present

## 2024-07-01 DIAGNOSIS — Z9889 Other specified postprocedural states: Secondary | ICD-10-CM | POA: Diagnosis present

## 2024-07-01 DIAGNOSIS — R4689 Other symptoms and signs involving appearance and behavior: Secondary | ICD-10-CM | POA: Diagnosis not present

## 2024-07-01 DIAGNOSIS — B9620 Unspecified Escherichia coli [E. coli] as the cause of diseases classified elsewhere: Secondary | ICD-10-CM | POA: Diagnosis not present

## 2024-07-01 DIAGNOSIS — G4733 Obstructive sleep apnea (adult) (pediatric): Secondary | ICD-10-CM | POA: Diagnosis not present

## 2024-07-01 DIAGNOSIS — Z1624 Resistance to multiple antibiotics: Secondary | ICD-10-CM | POA: Diagnosis present

## 2024-07-01 DIAGNOSIS — Z7952 Long term (current) use of systemic steroids: Secondary | ICD-10-CM

## 2024-07-01 DIAGNOSIS — J9811 Atelectasis: Secondary | ICD-10-CM | POA: Diagnosis present

## 2024-07-01 DIAGNOSIS — Z885 Allergy status to narcotic agent status: Secondary | ICD-10-CM

## 2024-07-01 DIAGNOSIS — Z800 Family history of malignant neoplasm of digestive organs: Secondary | ICD-10-CM

## 2024-07-01 DIAGNOSIS — I951 Orthostatic hypotension: Secondary | ICD-10-CM | POA: Diagnosis not present

## 2024-07-01 DIAGNOSIS — Z79899 Other long term (current) drug therapy: Secondary | ICD-10-CM

## 2024-07-01 DIAGNOSIS — D638 Anemia in other chronic diseases classified elsewhere: Secondary | ICD-10-CM | POA: Diagnosis not present

## 2024-07-01 DIAGNOSIS — Z7984 Long term (current) use of oral hypoglycemic drugs: Secondary | ICD-10-CM

## 2024-07-01 LAB — CBC WITH DIFFERENTIAL/PLATELET
Abs Immature Granulocytes: 0.13 K/uL — ABNORMAL HIGH (ref 0.00–0.07)
Basophils Absolute: 0.1 K/uL (ref 0.0–0.1)
Basophils Relative: 1 %
Eosinophils Absolute: 0.1 K/uL (ref 0.0–0.5)
Eosinophils Relative: 1 %
HCT: 44.9 % (ref 39.0–52.0)
Hemoglobin: 15.4 g/dL (ref 13.0–17.0)
Immature Granulocytes: 1 %
Lymphocytes Relative: 8 %
Lymphs Abs: 1.4 K/uL (ref 0.7–4.0)
MCH: 27.4 pg (ref 26.0–34.0)
MCHC: 34.3 g/dL (ref 30.0–36.0)
MCV: 79.9 fL — ABNORMAL LOW (ref 80.0–100.0)
Monocytes Absolute: 1.4 K/uL — ABNORMAL HIGH (ref 0.1–1.0)
Monocytes Relative: 9 %
Neutro Abs: 13.7 K/uL — ABNORMAL HIGH (ref 1.7–7.7)
Neutrophils Relative %: 80 %
Platelets: 330 K/uL (ref 150–400)
RBC: 5.62 MIL/uL (ref 4.22–5.81)
RDW: 13.8 % (ref 11.5–15.5)
WBC: 16.9 K/uL — ABNORMAL HIGH (ref 4.0–10.5)
nRBC: 0 % (ref 0.0–0.2)

## 2024-07-01 LAB — COMPREHENSIVE METABOLIC PANEL WITH GFR
ALT: 34 U/L (ref 0–44)
AST: 25 U/L (ref 15–41)
Albumin: 3.7 g/dL (ref 3.5–5.0)
Alkaline Phosphatase: 123 U/L (ref 38–126)
Anion gap: 20 — ABNORMAL HIGH (ref 5–15)
BUN: 140 mg/dL — ABNORMAL HIGH (ref 8–23)
CO2: 16 mmol/L — ABNORMAL LOW (ref 22–32)
Calcium: 11.1 mg/dL — ABNORMAL HIGH (ref 8.9–10.3)
Chloride: 84 mmol/L — ABNORMAL LOW (ref 98–111)
Creatinine, Ser: 3.57 mg/dL — ABNORMAL HIGH (ref 0.61–1.24)
GFR, Estimated: 17 mL/min — ABNORMAL LOW (ref >60)
Glucose, Bld: 156 mg/dL — ABNORMAL HIGH (ref 70–99)
Potassium: 4.1 mmol/L (ref 3.5–5.1)
Sodium: 120 mmol/L — ABNORMAL LOW (ref 135–145)
Total Bilirubin: 1.1 mg/dL (ref 0.0–1.2)
Total Protein: 7.5 g/dL (ref 6.5–8.1)

## 2024-07-01 LAB — PROTIME-INR
INR: 1.9 — ABNORMAL HIGH (ref 0.8–1.2)
Prothrombin Time: 22.5 s — ABNORMAL HIGH (ref 11.4–15.2)

## 2024-07-01 LAB — I-STAT CG4 LACTIC ACID, ED: Lactic Acid, Venous: 1.9 mmol/L (ref 0.5–1.9)

## 2024-07-01 MED ORDER — SERTRALINE HCL 100 MG PO TABS
100.0000 mg | ORAL_TABLET | Freq: Every day | ORAL | Status: DC
  Administered 2024-07-02 – 2024-07-11 (×10): 100 mg via ORAL
  Filled 2024-07-01 (×10): qty 1

## 2024-07-01 MED ORDER — VITAMIN B-12 1000 MCG PO TABS
500.0000 ug | ORAL_TABLET | ORAL | Status: DC
  Administered 2024-07-03 – 2024-07-10 (×4): 500 ug via ORAL
  Filled 2024-07-01 (×4): qty 1

## 2024-07-01 MED ORDER — JUVEN PO PACK
1.0000 | PACK | Freq: Two times a day (BID) | ORAL | Status: DC
  Administered 2024-07-04 – 2024-07-11 (×12): 1 via ORAL
  Filled 2024-07-01 (×13): qty 1

## 2024-07-01 MED ORDER — CALCIUM POLYCARBOPHIL 625 MG PO TABS
625.0000 mg | ORAL_TABLET | Freq: Two times a day (BID) | ORAL | Status: DC
  Filled 2024-07-01: qty 1

## 2024-07-01 MED ORDER — LACTATED RINGERS IV BOLUS (SEPSIS)
1000.0000 mL | Freq: Once | INTRAVENOUS | Status: AC
  Administered 2024-07-01: 1000 mL via INTRAVENOUS

## 2024-07-01 MED ORDER — HYDROXYZINE HCL 25 MG PO TABS: 25.0000 mg | ORAL_TABLET | Freq: Three times a day (TID) | ORAL | Status: DC | PRN

## 2024-07-01 MED ORDER — IPRATROPIUM BROMIDE 0.03 % NA SOLN: 1.0000 | Freq: Three times a day (TID) | NASAL | Status: DC | PRN

## 2024-07-01 MED ORDER — MELATONIN 3 MG PO TABS
3.0000 mg | ORAL_TABLET | Freq: Every day | ORAL | Status: DC
  Administered 2024-07-03 – 2024-07-10 (×9): 3 mg via ORAL
  Filled 2024-07-01 (×9): qty 1

## 2024-07-01 MED ORDER — MIDODRINE HCL 5 MG PO TABS
15.0000 mg | ORAL_TABLET | Freq: Three times a day (TID) | ORAL | Status: DC
  Administered 2024-07-02 – 2024-07-11 (×28): 15 mg via ORAL
  Filled 2024-07-01 (×28): qty 3

## 2024-07-01 MED ORDER — GERHARDT'S BUTT CREAM
1.0000 | TOPICAL_CREAM | Freq: Two times a day (BID) | CUTANEOUS | Status: DC
  Administered 2024-07-02 – 2024-07-11 (×19): 1 via TOPICAL
  Filled 2024-07-01: qty 60

## 2024-07-01 MED ORDER — OMEGA-3-ACID ETHYL ESTERS 1 G PO CAPS
1.0000 g | ORAL_CAPSULE | ORAL | Status: DC
  Administered 2024-07-07 – 2024-07-11 (×2): 1 g via ORAL
  Filled 2024-07-01 (×3): qty 1

## 2024-07-01 MED ORDER — FENTANYL CITRATE PF 50 MCG/ML IJ SOSY: 12.5000 ug | PREFILLED_SYRINGE | INTRAMUSCULAR | Status: DC | PRN

## 2024-07-01 MED ORDER — MEDIHONEY WOUND/BURN DRESSING EX PSTE
1.0000 | PASTE | Freq: Every day | CUTANEOUS | Status: DC
  Administered 2024-07-02 – 2024-07-11 (×10): 1 via TOPICAL
  Filled 2024-07-01 (×2): qty 44

## 2024-07-01 MED ORDER — SALINE SPRAY 0.65 % NA SOLN: 1.0000 | Freq: Four times a day (QID) | NASAL | Status: DC | PRN

## 2024-07-01 MED ORDER — CYPROHEPTADINE HCL 4 MG PO TABS
4.0000 mg | ORAL_TABLET | Freq: Every day | ORAL | Status: DC
  Administered 2024-07-02 – 2024-07-10 (×9): 4 mg via ORAL
  Filled 2024-07-01 (×10): qty 1

## 2024-07-01 MED ORDER — FERROUS SULFATE 325 (65 FE) MG PO TABS: 325.0000 mg | ORAL_TABLET | Freq: Two times a day (BID) | ORAL | Status: DC

## 2024-07-01 MED ORDER — ROSUVASTATIN CALCIUM 5 MG PO TABS
10.0000 mg | ORAL_TABLET | Freq: Every evening | ORAL | Status: DC
  Administered 2024-07-02 – 2024-07-10 (×9): 10 mg via ORAL
  Filled 2024-07-01 (×9): qty 2

## 2024-07-01 MED ORDER — ACETAMINOPHEN 325 MG PO TABS
325.0000 mg | ORAL_TABLET | ORAL | Status: DC | PRN
  Administered 2024-07-03 – 2024-07-09 (×2): 650 mg via ORAL
  Filled 2024-07-01 (×2): qty 2

## 2024-07-01 MED ORDER — PSYLLIUM 95 % PO PACK
1.0000 | PACK | Freq: Every day | ORAL | Status: DC
  Administered 2024-07-06 – 2024-07-11 (×2): 1 via ORAL
  Filled 2024-07-01 (×4): qty 1

## 2024-07-01 MED ORDER — METOCLOPRAMIDE HCL 10 MG PO TABS
5.0000 mg | ORAL_TABLET | Freq: Three times a day (TID) | ORAL | Status: DC
  Administered 2024-07-02 – 2024-07-11 (×26): 5 mg via ORAL
  Filled 2024-07-01: qty 1
  Filled 2024-07-01: qty 0.5
  Filled 2024-07-01 (×2): qty 1
  Filled 2024-07-01 (×4): qty 0.5
  Filled 2024-07-01: qty 1
  Filled 2024-07-01 (×3): qty 0.5
  Filled 2024-07-01 (×2): qty 1
  Filled 2024-07-01: qty 0.5
  Filled 2024-07-01: qty 1
  Filled 2024-07-01 (×5): qty 0.5
  Filled 2024-07-01: qty 1
  Filled 2024-07-01 (×2): qty 0.5
  Filled 2024-07-01: qty 1
  Filled 2024-07-01: qty 0.5
  Filled 2024-07-01: qty 1
  Filled 2024-07-01 (×2): qty 0.5
  Filled 2024-07-01 (×5): qty 1
  Filled 2024-07-01: qty 0.5
  Filled 2024-07-01: qty 1
  Filled 2024-07-01: qty 0.5
  Filled 2024-07-01: qty 1
  Filled 2024-07-01 (×2): qty 0.5
  Filled 2024-07-01 (×5): qty 1
  Filled 2024-07-01: qty 0.5
  Filled 2024-07-01: qty 1
  Filled 2024-07-01 (×2): qty 0.5
  Filled 2024-07-01 (×2): qty 1
  Filled 2024-07-01 (×3): qty 0.5
  Filled 2024-07-01 (×2): qty 1
  Filled 2024-07-01: qty 0.5

## 2024-07-01 MED ORDER — FLUDROCORTISONE ACETATE 0.1 MG PO TABS
0.2000 mg | ORAL_TABLET | Freq: Every day | ORAL | Status: DC
  Administered 2024-07-02 – 2024-07-11 (×10): 0.2 mg via ORAL
  Filled 2024-07-01 (×11): qty 2

## 2024-07-01 MED ORDER — LACTATED RINGERS IV SOLN: INTRAVENOUS | Status: AC

## 2024-07-01 MED ORDER — LOPERAMIDE HCL 2 MG PO CAPS
4.0000 mg | ORAL_CAPSULE | Freq: Two times a day (BID) | ORAL | Status: DC
  Administered 2024-07-02: 4 mg via ORAL
  Filled 2024-07-01: qty 2

## 2024-07-01 MED ORDER — OXYCODONE-ACETAMINOPHEN 5-325 MG PO TABS
2.0000 | ORAL_TABLET | Freq: Four times a day (QID) | ORAL | Status: DC | PRN
  Administered 2024-07-04 – 2024-07-11 (×5): 2 via ORAL
  Filled 2024-07-01 (×5): qty 2

## 2024-07-01 MED ORDER — PROCHLORPERAZINE MALEATE 5 MG PO TABS: 5.0000 mg | ORAL_TABLET | Freq: Four times a day (QID) | ORAL | Status: DC | PRN

## 2024-07-01 MED ORDER — CYCLOBENZAPRINE HCL 10 MG PO TABS: 5.0000 mg | ORAL_TABLET | Freq: Three times a day (TID) | ORAL | Status: DC | PRN

## 2024-07-01 MED ORDER — APIXABAN 5 MG PO TABS
5.0000 mg | ORAL_TABLET | Freq: Two times a day (BID) | ORAL | Status: DC
  Administered 2024-07-02 – 2024-07-11 (×20): 5 mg via ORAL
  Filled 2024-07-01 (×20): qty 1

## 2024-07-01 MED ORDER — ADULT MULTIVITAMIN W/MINERALS CH
1.0000 | ORAL_TABLET | Freq: Every day | ORAL | Status: DC
  Administered 2024-07-04 – 2024-07-11 (×7): 1 via ORAL
  Filled 2024-07-01 (×8): qty 1

## 2024-07-01 MED ORDER — SODIUM CHLORIDE 0.9 % IV SOLN
2.0000 g | Freq: Once | INTRAVENOUS | Status: AC
  Administered 2024-07-01: 2 g via INTRAVENOUS
  Filled 2024-07-01: qty 12.5

## 2024-07-01 MED ORDER — ALUM & MAG HYDROXIDE-SIMETH 200-200-20 MG/5ML PO SUSP: 15.0000 mL | ORAL | Status: DC | PRN

## 2024-07-01 MED ORDER — METOCLOPRAMIDE HCL 5 MG/ML IJ SOLN: 10.0000 mg | Freq: Four times a day (QID) | INTRAMUSCULAR | Status: DC | PRN

## 2024-07-01 MED ORDER — ENSURE PLUS HIGH PROTEIN PO LIQD
237.0000 mL | Freq: Two times a day (BID) | ORAL | Status: DC
  Administered 2024-07-11: 237 mL via ORAL

## 2024-07-01 NOTE — ED Provider Notes (Signed)
 [place] EMERGENCY DEPARTMENT AT [facility] Provider Note   CSN: [id number] Arrival date & time: 07/01/24  8292     Patient presents with: Post-op Problem   Kristopher Thompson is a 73 y.o. male.   HPI Patient is brought by EMS from home.  Patient had a prolonged and complex hospital stay and rehabilitation.  Patient reports that today he is feeling much more fatigued than normal.  He reports he feels overwhelmingly exhausted and weak.  Patient does not think he has had a fever at home.  He reports has been hard to eat because he is intensely nauseated but he has not been vomiting.  Patient has had an extremely protracted course of recurrent complications and rehospitalization's.  At the end of May he had an admission to general surgery for a left inguinal incarcerated hernia repair with mesh and urostomy ileal conduit revision by Dr. Sheldon.  The following week the patient had a lot of vomiting and ultimately needed an NG tube placed.  He developed a perforated bowel and intra-abdominal abscess with small bowel resection and wound VAC placement.  Repeat surgeries were done 6\10 with abdominal wall debridement ileal resection end ileostomy abdominal wall hernia repairs and a wound VAC.  Mid June the patient had worsening renal function and required dialysis he became encephalopathic mid June and required ongoing dialysis for uremia and encephalopathy.  Patient had nephrostomy tubes exchanged by IR at 6\30.  The beginning of July 7\2 patient had right percutaneous nephrostomy exchange with ureteral stent by IR.  By July 11 the patient was stable for discharge cleared by general surgery and urology.  2\85 patient had readmission with AKI.  On outpatient patient has had follow-up with Dr. Renda urology [phone].  Patient has been undergoing consistently physical therapy and Occupational Therapy.    Prior to Admission medications   Medication Sig Start Date End Date Taking? Authorizing  Provider  acetaminophen  (TYLENOL ) 325 MG tablet Take 1-2 tablets (325-650 mg total) by mouth every 4 (four) hours as needed for mild pain (pain score 1-3). 06/18/24   Leak, Brandi L, NP  alum & mag hydroxide-simeth (MAALOX PLUS) 400-400-40 MG/5ML suspension Take 15 mLs by mouth every 4 (four) hours as needed for indigestion. 06/18/24   Leak, Brandi L, NP  cyclobenzaprine  (FLEXERIL ) 5 MG tablet Take 1 tablet (5 mg total) by mouth 3 (three) times daily as needed for muscle spasms. 04/10/24   [name], MD  cyproheptadine  (PERIACTIN ) 4 MG tablet Take 1 tablet (4 mg total) by mouth at bedtime. 06/18/24   Leak, Brandi L, NP  ELIQUIS  5 MG TABS tablet Take 5 mg by mouth 2 (two) times daily.    [provider]  feeding supplement (ENSURE PLUS HIGH PROTEIN) LIQD Take 237 mLs by mouth 2 (two) times daily between meals. Patient not taking: Reported on 05/26/2024 05/22/24   Sherrill Cable Latif, DO  ferrous sulfate  325 (65 FE) MG tablet Take 1 tablet (325 mg total) by mouth 2 (two) times daily with a meal. 06/18/24   Leak, Brandi L, NP  fludrocortisone  (FLORINEF ) 0.1 MG tablet Take 2 tablets (0.2 mg total) by mouth daily. 06/19/24   Leak, Brandi L, NP  hydrOXYzine  (ATARAX ) 25 MG tablet Take 1 tablet (25 mg total) by mouth 3 (three) times daily as needed for anxiety. 05/22/24   Sheikh, Omair Latif, DO  ipratropium (ATROVENT ) 0.03 % nasal spray Place 1 spray into both nostrils every 8 (eight) hours as needed (allergies).  [provider]  leptospermum manuka honey (MEDIHONEY) PSTE paste Apply 1 Application topically daily. 06/19/24   Leak, Brandi L, NP  loperamide  (IMODIUM ) 2 MG capsule Take 2 capsules (4 mg total) by mouth 2 (two) times daily. 05/27/24   [name], MD  melatonin 3 MG TABS tablet Take 1 tablet (3 mg total) by mouth at bedtime. 06/18/24   Leak, Brandi L, NP  metFORMIN (GLUCOPHAGE) 500 MG tablet Take 500 mg by mouth daily. 01/29/24   [provider]  metoCLOPramide  (REGLAN ) 5 MG  tablet Take 1 tablet (5 mg total) by mouth 3 (three) times daily before meals. 06/18/24   Leak, Brandi L, NP  midodrine  (PROAMATINE ) 5 MG tablet Take 3 tablets (15 mg total) by mouth 3 (three) times daily with meals. 06/18/24   Leak, Brandi L, NP  Multiple Vitamin (MULTIVITAMIN PO) Take 1 tablet by mouth daily.    [provider]  nutrition supplement, JUVEN, (JUVEN) PACK Take 1 packet by mouth 2 (two) times daily between meals. Patient not taking: Reported on 05/26/2024 05/22/24   Sheikh, Omair Latif, DO  Nystatin (GERHARDT'S BUTT CREAM) CREA Apply 1 Application topically 2 (two) times daily. 06/18/24   Leak, Daphne CROME, NP  Omega-3 Fatty Acids (FISH OIL) 1000 MG CAPS Take 1,000 mg by mouth every Monday, Wednesday, and Friday.    [provider]  oxyCODONE -acetaminophen  (PERCOCET) 5-325 MG tablet Take 2 tablets by mouth every 6 (six) hours as needed for severe pain (pain score 7-10). 06/18/24   Leak, Brandi L, NP  polycarbophil (FIBERCON) 625 MG tablet Take 1 tablet (625 mg total) by mouth 2 (two) times daily. 05/20/24   [name], MD  prochlorperazine  (COMPAZINE ) 5 MG tablet Take 1 tablet (5 mg total) by mouth every 6 (six) hours as needed for nausea. 06/18/24   Leak, Brandi L, NP  psyllium (HYDROCIL/METAMUCIL) 95 % PACK Take 1 packet by mouth daily. 06/19/24   Leak, Brandi L, NP  rosuvastatin  (CRESTOR ) 10 MG tablet Take 1 tablet (10 mg total) by mouth every evening. 06/18/24   Leak, Brandi L, NP  sertraline  (ZOLOFT ) 100 MG tablet Take 1 tablet (100 mg total) by mouth daily. 06/19/24   Leak, Brandi L, NP  sodium chloride  (OCEAN) 0.65 % SOLN nasal spray Place 1-2 sprays into both nostrils every 6 (six) hours as needed for congestion (dry nose). Patient not taking: Reported on 05/26/2024 05/22/24   Sheikh, Omair Latif, DO  vitamin B-12 (CYANOCOBALAMIN ) 500 MCG tablet Take 500 mcg by mouth every Monday, Wednesday, and Friday.    [provider]  ZINC OXIDE EX Apply 1 application  topically See  admin instructions. Cleanse sacrum with wound cleanser; pat dry; apply zinc oxide ointment and leave open to air. Every day shift for wound care.    [provider]    Allergies: Demerol  [meperidine ]    Review of Systems  Updated Vital Signs BP 96/67   Pulse (!) 113   Temp (!) 97.5 F (36.4 C)   Resp 14   Ht 5' 11 (1.803 m)   Wt 91.9 kg   BMI 28.26 kg/m   Physical Exam Constitutional:      Comments: Patient is fatigued and ill in appearance.  He does not have any respiratory distress.  Deconditioned appearance.  HENT:     Head: Normocephalic and atraumatic.     Mouth/Throat:     Mouth: Mucous membranes are dry.     Pharynx: Oropharynx is clear.  Eyes:  Extraocular Movements: Extraocular movements intact.  Cardiovascular:     Rate and Rhythm: Regular rhythm. Tachycardia present.  Pulmonary:     Effort: Pulmonary effort is normal.     Breath sounds: Normal breath sounds.  Abdominal:     Comments: Patient has a colostomy with thin flocculent brown and green stool present.  Patient has a urostomy with a conduit tube in place.  Urine is basically clear with slight pink tinge.  Patient has another urostomy tube that has a pale creamy output.  Patient has a midline healing wound with a wet-to-dry dressing in place.  This does appear to be healing well.  No surrounding cellulitis.  Abdomen is diffusely soft without guarding.  Musculoskeletal:     Comments: Extremities are warm and dry.  Patient does not have significant edema of the feet.  He does have a slight lower leg edema about 1+  Skin:    General: Skin is warm and dry.  Neurological:     Comments: Patient seems mildly confused.  He is answering questions but seems slightly fatigued and vague.  He is generally very weak without a focal deficit.     (all labs ordered are listed, but only abnormal results are displayed) Labs Reviewed  COMPREHENSIVE METABOLIC PANEL WITH GFR - Abnormal; Notable for the following  components:      Result Value   Sodium 120 (*)    Chloride 84 (*)    CO2 16 (*)    Glucose, Bld 156 (*)    BUN 140 (*)    Creatinine, Ser 3.57 (*)    Calcium  11.1 (*)    GFR, Estimated 17 (*)    Anion gap 20 (*)    All other components within normal limits  CBC WITH DIFFERENTIAL/PLATELET - Abnormal; Notable for the following components:   WBC 16.9 (*)    MCV 79.9 (*)    Neutro Abs 13.7 (*)    Monocytes Absolute 1.4 (*)    Abs Immature Granulocytes 0.13 (*)    All other components within normal limits  PROTIME-INR - Abnormal; Notable for the following components:   Prothrombin Time 22.5 (*)    INR 1.9 (*)    All other components within normal limits  CULTURE, BLOOD (ROUTINE X 2)  CULTURE, BLOOD (ROUTINE X 2)  URINALYSIS, W/ REFLEX TO CULTURE (INFECTION SUSPECTED)  I-STAT CG4 LACTIC ACID, ED  I-STAT CG4 LACTIC ACID, ED    EKG: EKG Interpretation Date/Time:  Wednesday July 01 2024 17:14:22 EDT Ventricular Rate:  113 PR Interval:  178 QRS Duration:  154 QT Interval:  357 QTC Calculation: 490 R Axis:   -26  Text Interpretation: Sinus tachycardia 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 Result Date: 07/01/2024 CLINICAL DATA: Suspected sepsis EXAM: PORTABLE CHEST 1 VIEW COMPARISON:  Chest radiograph June 12, 2024. FINDINGS: The heart size and mediastinal contours are within normal limits. Both lungs are clear. The visualized skeletal structures are unremarkable. IMPRESSION: No active disease. Electronically Signed   By: Megan  Zare M.D.   On: 07/01/2024 17:45     Procedures  CRITICAL CARE Performed by: Canning Armenta   Total critical care time: 30 minutes  Critical care time was exclusive of separately billable procedures and treating other patients.  Critical care was necessary to treat or  prevent imminent or  life-threatening deterioration.  Critical care was time spent personally by me on the following activities: development of treatment plan with patient and/or surrogate as well as nursing, discussions with consultants, evaluation of patient's response to treatment, examination of patient, obtaining history from patient or surrogate, ordering and performing treatments and interventions, ordering and review of laboratory studies, ordering and review of radiographic studies, pulse oximetry and re-evaluation of patient's condition.  Medications Ordered in the ED  lactated ringers  infusion ( Intravenous New Bag/Given 07/01/24 2043)  lactated ringers  bolus 1,000 mL (1,000 mLs Intravenous New Bag/Given 07/01/24 2045)    And  lactated ringers  bolus 1,000 mL (has no administration in time range)    And  lactated ringers  bolus 1,000 mL (has no administration in time range)  ceFEPIme  (MAXIPIME ) 2 g in sodium chloride  0.9 % 100 mL IVPB (2 g Intravenous New Bag/Given 07/01/24 2044)                                    Medical Decision Making Amount and/or Complexity of Data Reviewed Labs: ordered. Radiology: ordered.  Risk Prescription drug management. Decision regarding hospitalization.   Patient presents as outlined.  He has severe comorbid illness and complex medical course over the past 3 months.  Patient [place] presents today with increasing generalized weakness and report of some confusion.  He does have a vaguely encephalopathic appearance being slow with his responses although he is situationally appropriate.  At this time patient has recurrence of leukocytosis at 16,000 recurrence of AKI with BUN of 140 and creatinine of 3.5 and GFR of 17 which is a significant change from his dischargeGFR of 57 8\8\25.   Prior cultures showed patient had Klebsiella and Citrobacter infection in the urine.  At this was sensitive to cefepime .  At this time I have ordered cefepime  and fluid  resuscitation for sepsis.  Patient will require readmission.  Consult: Dr. Fredirick [facility] hospitalist for admission     Final diagnoses:  Sepsis, due to unspecified organism, unspecified whether acute organ dysfunction present [facility])  AKI (acute kidney injury) (HCC)  Severe comorbid illness  Hyponatremia    ED Discharge Orders     None          Armenta Canning, MD 07/01/24 2110

## 2024-07-01 NOTE — Sepsis Progress Note (Signed)
 Following for sepsis monitoring ?

## 2024-07-01 NOTE — Assessment & Plan Note (Signed)
 Per wife his mood is fairly good.

## 2024-07-01 NOTE — Assessment & Plan Note (Signed)
 Continue home CPAP ?

## 2024-07-01 NOTE — ED Triage Notes (Signed)
 BIB ems from home.  Decreased urine output. Weak. More fatigued than usual. +confusion. Decreased PO.  158 glucose EKG: R BBB 20 LAC

## 2024-07-01 NOTE — Assessment & Plan Note (Signed)
 Nephrostomy tubes to be uncapped

## 2024-07-01 NOTE — H&P (Signed)
 History and Physical    Patient: Kristopher Thompson FMW:[id number] DOB: 11/25/1950 DOA: 07/01/2024 DOS: the patient was seen and examined on 07/01/2024 PCP: [name], MD  Patient coming from: Home  Chief Complaint:  Chief Complaint  Patient presents with   Post-op Problem   HPI: Kristopher Thompson is a 73 y.o. male with medical history significant of HTN, CHFpEF, CAD, T2DM, PE on Eliquis , prostate cancer, bladder cancer who was admitted back in late May for incarcerated incisional hernia and urostomy ileal conduit revision.  Postoperative course was complicated by bowel perforation, sepsis, ICU admission with intubation, requiring pressors and CRRT.  Patient had bilateral nephrostomy tubes placed as well and had been in inpatient rehab until August 8 of this year.  At that time a study was done that supported capping the nephrostomy tubes.  Patient was sent home and was doing well including ambulating on his own, was tolerating p.o., had good urine output until approximately 3 days ago.  At that time he became too weak to pull himself up in the bed or get out of bed, he had muffled voice and nausea.  The wife reports decreased urine output from about a liter a day to down to 300 in the last 24 hours.  He was brought into the ED where he was noted to have a white count of 16.9 and a low sodium at 120 a BUN of 140 and a creatinine of 3.57 up from 1.33 at time of discharge.  Due to the white count and this extensive history, he was started on sepsis protocol given fluids IV antibiotics and we were asked to admit.  Patient did have a negative chest x-ray.  Review of Systems: As mentioned in the history of present illness. All other systems reviewed and are negative. Past Medical History:  Diagnosis Date   At risk for sleep apnea    12-25-2017   STOP-BANG SCORE= 5   --- SENT TO PCP   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   moderate atypia - right low back   Atypical nevus 04/04/2007   moderate to marked -  right upper back (wider shave)   Atypical nevus 04/04/2007   moderate atypia - center chest (wider shave)   Atypical nevus 04/04/2007   slight atypia - right thigh   Atypical nevus 11/29/2011   mild atypia - center upper back   Atypical nevus 11/29/2011   mild atypia - center chest   Bacteremia due to Klebsiella pneumoniae 10/09/2017   Bladder cancer (HCC) dx 07/2017   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  s/p  cystectomy w/ ileal conduit urinary diversion   Candida infection    CHF (congestive heart failure) (HCC)    Colostomy in place (HCC)    since 08-08-2017-- per pt 12-25-2017 reddness around stoma   Diabetes mellitus without complication (HCC)    GERD (gastroesophageal reflux disease)    H/O hiatal hernia    History of sepsis 09/2017   dx bacteremia due to klebsiella pneumoniae,  post op intraabdominal abscess   Prostate cancer [facility]) urologist-- dr renda   10-02-2012 s/p  prostatectomy-- Stage T1c   RBBB    Renal disorder    pt. denies   Sleep apnea    cpap   Squamous cell carcinoma of skin 05/22/2013   left cheek - CX3 + 5FU   Wears glasses    Past Surgical History:  Procedure Laterality Date   ABDOMINAL SURGERY     APPENDECTOMY  1972   BOWEL  RESECTION N/A 04/20/2024   Procedure: SMALL BOWEL RESECTION;  Surgeon: [name], MD;  Location: [facility];  Service: General;  Laterality: N/A;   CHOLECYSTECTOMY  1985   COLONOSCOPY N/A 06/29/2021   Procedure: COLONOSCOPY;  Surgeon: Debby Hila, MD;  Location: [facility];  Service: Endoscopy;  Laterality: N/A;   COLOSTOMY REVERSAL N/A 01/08/2018   Procedure: COLOSTOMY REVERSAL;  Surgeon: Debby Hila, MD;  Location: [facility];  Service: General;  Laterality: N/A;   CYSTOSCOPY WITH RETROGRADE PYELOGRAM, URETEROSCOPY AND STENT PLACEMENT Right 06/10/2017   Procedure: CYSTOSCOPY WITH RIGHT URETEROSCOPY WITH RIGHT STENT PLACEMENT;  Surgeon: [name], MD;  Location: [facility];  Service: Urology;  Laterality: Right;   EUS N/A  04/16/2018   Procedure: FULL UPPER ENDOSCOPIC ULTRASOUND (EUS) RADIAL;  Surgeon: Burnette Fallow, MD;  Location: [facility];  Service: Endoscopy;  Laterality: N/A;   FLEXIBLE SIGMOIDOSCOPY N/A 12/13/2017   Procedure: FLEXIBLE SIGMOIDOSCOPY;  Surgeon: Debby Hila, MD;  Location: [facility];  Service: Endoscopy;  Laterality: N/A;   ILEO CONDUIT     IR CATHETER TUBE CHANGE  12/13/2017   IR CATHETER TUBE CHANGE  01/17/2018   IR CATHETER TUBE CHANGE  02/28/2018   IR CATHETER TUBE CHANGE  07/18/2018   IR CATHETER TUBE CHANGE  08/22/2018   IR CATHETER TUBE CHANGE  10/31/2018   IR CONVERT LEFT NEPHROSTOMY TO NEPHROURETERAL CATH  10/24/2017   IR EXT NEPHROURETERAL CATH EXCHANGE  05/19/2018   IR EXT NEPHROURETERAL CATH EXCHANGE  06/16/2018   IR NEPHRO TUBE REMOV/FL  10/24/2017   IR NEPHROSTOGRAM LEFT THRU EXISTING ACCESS  12/05/2018   IR NEPHROSTOMY EXCHANGE LEFT  05/11/2024   IR NEPHROSTOMY PLACEMENT LEFT  10/07/2017   IR NEPHROSTOMY PLACEMENT RIGHT  05/04/2024   IR NEPHROSTOMY TUBE CHANGE  04/11/2018   IR URETERAL STENT PLACEMENT EXISTING ACCESS LEFT  05/13/2024   IR URETERAL STENT RIGHT NEW ACCESS W/SEP NEPHROSTOMY CATH  05/13/2024   LAPAROTOMY N/A 04/20/2024   Procedure: EXPLORATORY LAPAROTOMY;  Surgeon: [name], MD;  Location: [facility];  Service: General;  Laterality: N/A;   PARTIAL COLECTOMY N/A 04/21/2024   Procedure: COLECTOMY, PARTIAL;  Surgeon: [name], MD;  Location: [facility];  Service: General;  Laterality: N/A;  Removal Wound Vac, Washout Ostomy, Possible Anastomosis, Possible Ileostomy.  Phasix Mesh.   POLYPECTOMY  06/29/2021   Procedure: POLYPECTOMY;  Surgeon: Debby Hila, MD;  [facility]: [facility];  Service: Endoscopy;;   ROBOT ASSISTED LAPAROSCOPIC RADICAL PROSTATECTOMY  10/02/2012   Procedure: ROBOTIC ASSISTED LAPAROSCOPIC RADICAL PROSTATECTOMY LEVEL 2;  Surgeon: Noretta Renda, MD;  Location: [facility];  Service: Urology;  Laterality: N/A;   ROBOTIC ASSISTED LAPAROSCOPIC BLADDER  DIVERTICULECTOMY N/A 08/08/2017   Procedure: XI ROBOTIC ASSISTED LAPAROSCOPIC RADICAL CYSTECTOMY COVERTED TO OPEN PELVIC LYMPHADNECTOMY BILATERAL AND ILEAL CONDUIT URINARY DIVERSION;  Surgeon: [name], MD;  Location: [facility];  Service: Urology;  Laterality: N/A;   TRANSURETHRAL RESECTION OF BLADDER TUMOR  06/10/2017   Procedure: TRANSURETHRAL RESECTION OF BLADDER TUMOR (TURBT);  Surgeon: [name], MD;  Location: [facility];  Service: Urology;;   VACUUM ASSISTED CLOSURE CHANGE N/A 04/20/2024   Procedure: PLACEMENT OF CONCETTA CAPES;  Surgeon: [name], MD;  Location: [facility];  Service: General;  Laterality: N/A;   VENTRAL HERNIA REPAIR N/A 04/10/2024   Procedure: REPAIR, HERNIA, VENTRAL;  Surgeon: [name], MD;  Location: [facility];  Service: General;  Laterality: N/A;   XI ROBOT ABDOMINAL PERINEAL RESECTION N/A 08/08/2017   Procedure: REPAIR OF RECTAL TEAR POSSIBLE PARTIAL  PROCTECTOMY, CREATION OF  OSTOMY;  Surgeon: Debby Hila, MD;  Location: [facility];  Service: General;  Laterality: N/A;   XI ROBOTIC ASSISTED PARASTOMAL HERNIA REPAIR N/A 04/10/2024   Procedure: REPAIR, HERNIA, PARASTOMAL AND VENTRAL HERNIAS, ROBOT-ASSISTED, LEFT INGUINAL HERNIA, LYSIS OF ADHESIONS INCARCERATED AND INSIONAL HERNIAS AND UROSTOMY REVISION;  Surgeon: [name], MD;  Location: [facility];  Service: General;  Laterality: N/A;   Social History:  reports that he quit smoking about 44 years ago. His smoking use included cigarettes. He started smoking about 53 years ago. He has never used smokeless tobacco. He reports current alcohol  use. He reports that he does not use drugs.  Allergies  Allergen Reactions   Demerol  [Meperidine ] Nausea And Vomiting    Family History  Problem Relation Age of Onset   Lung cancer Mother    Hypertension Father    Colon cancer Other    CAD Neg Hx    Diabetes Neg Hx    Stroke Neg Hx     Prior to Admission medications   Medication Sig Start Date End Date Taking? Authorizing  Provider  acetaminophen  (TYLENOL ) 325 MG tablet Take 1-2 tablets (325-650 mg total) by mouth every 4 (four) hours as needed for mild pain (pain score 1-3). 06/18/24   Leak, Brandi L, NP  alum & mag hydroxide-simeth (MAALOX PLUS) 400-400-40 MG/5ML suspension Take 15 mLs by mouth every 4 (four) hours as needed for indigestion. 06/18/24   Leak, Brandi L, NP  cyclobenzaprine  (FLEXERIL ) 5 MG tablet Take 1 tablet (5 mg total) by mouth 3 (three) times daily as needed for muscle spasms. 04/10/24   [name], MD  cyproheptadine  (PERIACTIN ) 4 MG tablet Take 1 tablet (4 mg total) by mouth at bedtime. 06/18/24   Leak, Brandi L, NP  ELIQUIS  5 MG TABS tablet Take 5 mg by mouth 2 (two) times daily.    [provider]  feeding supplement (ENSURE PLUS HIGH PROTEIN) LIQD Take 237 mLs by mouth 2 (two) times daily between meals. Patient not taking: Reported on 05/26/2024 05/22/24   Sherrill Cable Latif, DO  ferrous sulfate  325 (65 FE) MG tablet Take 1 tablet (325 mg total) by mouth 2 (two) times daily with a meal. 06/18/24   Leak, Brandi L, NP  fludrocortisone  (FLORINEF ) 0.1 MG tablet Take 2 tablets (0.2 mg total) by mouth daily. 06/19/24   Leak, Brandi L, NP  hydrOXYzine  (ATARAX ) 25 MG tablet Take 1 tablet (25 mg total) by mouth 3 (three) times daily as needed for anxiety. 05/22/24   Sheikh, Omair Latif, DO  ipratropium (ATROVENT ) 0.03 % nasal spray Place 1 spray into both nostrils every 8 (eight) hours as needed (allergies).    [provider]  leptospermum manuka honey (MEDIHONEY) PSTE paste Apply 1 Application topically daily. 06/19/24   Leak, Brandi L, NP  loperamide  (IMODIUM ) 2 MG capsule Take 2 capsules (4 mg total) by mouth 2 (two) times daily. 05/27/24   [name], MD  melatonin 3 MG TABS tablet Take 1 tablet (3 mg total) by mouth at bedtime. 06/18/24   Leak, Brandi L, NP  metFORMIN (GLUCOPHAGE) 500 MG tablet Take 500 mg by mouth daily. 01/29/24   [provider]  metoCLOPramide  (REGLAN ) 5 MG  tablet Take 1 tablet (5 mg total) by mouth 3 (three) times daily before meals. 06/18/24   Leak, Brandi L, NP  midodrine  (PROAMATINE ) 5 MG tablet Take 3 tablets (15 mg total) by mouth 3 (three) times daily with meals. 06/18/24  Leak, Brandi L, NP  Multiple Vitamin (MULTIVITAMIN PO) Take 1 tablet by mouth daily.    [provider]  nutrition supplement, JUVEN, (JUVEN) PACK Take 1 packet by mouth 2 (two) times daily between meals. Patient not taking: Reported on 05/26/2024 05/22/24   Sheikh, Omair Latif, DO  Nystatin (GERHARDT'S BUTT CREAM) CREA Apply 1 Application topically 2 (two) times daily. 06/18/24   Leak, Daphne CROME, NP  Omega-3 Fatty Acids (FISH OIL) 1000 MG CAPS Take 1,000 mg by mouth every Monday, Wednesday, and Friday.    [provider]  oxyCODONE -acetaminophen  (PERCOCET) 5-325 MG tablet Take 2 tablets by mouth every 6 (six) hours as needed for severe pain (pain score 7-10). 06/18/24   Leak, Brandi L, NP  polycarbophil (FIBERCON) 625 MG tablet Take 1 tablet (625 mg total) by mouth 2 (two) times daily. 05/20/24   [name], MD  prochlorperazine  (COMPAZINE ) 5 MG tablet Take 1 tablet (5 mg total) by mouth every 6 (six) hours as needed for nausea. 06/18/24   Leak, Brandi L, NP  psyllium (HYDROCIL/METAMUCIL) 95 % PACK Take 1 packet by mouth daily. 06/19/24   Leak, Brandi L, NP  rosuvastatin  (CRESTOR ) 10 MG tablet Take 1 tablet (10 mg total) by mouth every evening. 06/18/24   Leak, Brandi L, NP  sertraline  (ZOLOFT ) 100 MG tablet Take 1 tablet (100 mg total) by mouth daily. 06/19/24   Leak, Brandi L, NP  sodium chloride  (OCEAN) 0.65 % SOLN nasal spray Place 1-2 sprays into both nostrils every 6 (six) hours as needed for congestion (dry nose). Patient not taking: Reported on 05/26/2024 05/22/24   Sheikh, Omair Latif, DO  vitamin B-12 (CYANOCOBALAMIN ) 500 MCG tablet Take 500 mcg by mouth every Monday, Wednesday, and Friday.    [provider]  ZINC OXIDE EX Apply 1 application  topically See  admin instructions. Cleanse sacrum with wound cleanser; pat dry; apply zinc oxide ointment and leave open to air. Every day shift for wound care.    [provider]    Physical Exam: Vitals:   07/01/24 1710 07/01/24 1712  BP:  96/67  Pulse:  (!) 113  Resp:  14  Temp:  (!) 97.5 F (36.4 C)  Weight: 91.9 kg   Height: 5' 11 (1.803 m)    Physical Examination: General appearance - oriented to person, place, and time and chronically ill appearing Chest - clear to auscultation, no wheezes, rales or rhonchi, symmetric air entry Heart - normal rate, regular rhythm, normal S1, S2, no murmurs, rubs, clicks or gallops Abdomen -firm, not especially tender, ileostomy with some solid and some liquid stool in it urostomy with pink-tinged urine with some mild sediment in it   Data Reviewed: Results for orders placed or performed during the hospital encounter of 07/01/24 (from the past 24 hours)  Comprehensive metabolic panel     Status: Abnormal   Collection Time: 07/01/24  5:16 PM  Result Value Ref Range   Sodium 120 (L) 135 - 145 mmol/L   Potassium 4.1 3.5 - 5.1 mmol/L   Chloride 84 (L) 98 - 111 mmol/L   CO2 16 (L) 22 - 32 mmol/L   Glucose, Bld 156 (H) 70 - 99 mg/dL   BUN 859 (H) 8 - 23 mg/dL   Creatinine, Ser 6.42 (H) 0.61 - 1.24 mg/dL   Calcium  11.1 (H) 8.9 - 10.3 mg/dL   Total Protein 7.5 6.5 - 8.1 g/dL   Albumin  3.7 3.5 - 5.0 g/dL   AST 25 15 -  41 U/L   ALT 34 0 - 44 U/L   Alkaline Phosphatase 123 38 - 126 U/L   Total Bilirubin 1.1 0.0 - 1.2 mg/dL   GFR, Estimated 17 (L) >60 mL/min   Anion gap 20 (H) 5 - 15  CBC with Differential     Status: Abnormal   Collection Time: 07/01/24  5:16 PM  Result Value Ref Range   WBC 16.9 (H) 4.0 - 10.5 K/uL   RBC 5.62 4.22 - 5.81 MIL/uL   Hemoglobin 15.4 13.0 - 17.0 g/dL   HCT 55.0 60.9 - 47.9 %   MCV 79.9 (L) 80.0 - 100.0 fL   MCH 27.4 26.0 - 34.0 pg   MCHC 34.3 30.0 - 36.0 g/dL   RDW 86.1 88.4 - 84.4 %   Platelets 330 150 - 400  K/uL   nRBC 0.0 0.0 - 0.2 %   Neutrophils Relative % 80 %   Neutro Abs 13.7 (H) 1.7 - 7.7 K/uL   Lymphocytes Relative 8 %   Lymphs Abs 1.4 0.7 - 4.0 K/uL   Monocytes Relative 9 %   Monocytes Absolute 1.4 (H) 0.1 - 1.0 K/uL   Eosinophils Relative 1 %   Eosinophils Absolute 0.1 0.0 - 0.5 K/uL   Basophils Relative 1 %   Basophils Absolute 0.1 0.0 - 0.1 K/uL   Immature Granulocytes 1 %   Abs Immature Granulocytes 0.13 (H) 0.00 - 0.07 K/uL  Protime-INR     Status: Abnormal   Collection Time: 07/01/24  5:16 PM  Result Value Ref Range   Prothrombin Time 22.5 (H) 11.4 - 15.2 seconds   INR 1.9 (H) 0.8 - 1.2  I-Stat Lactic Acid, ED     Status: None   Collection Time: 07/01/24  6:08 PM  Result Value Ref Range   Lactic Acid, Venous 1.9 0.5 - 1.9 mmol/L   DG Chest Port 1 View if patient is in a treatment room. Result Date: 07/01/2024 CLINICAL DATA: Suspected sepsis EXAM: PORTABLE CHEST 1 VIEW COMPARISON:  Chest radiograph June 12, 2024. FINDINGS: The heart size and mediastinal contours are within normal limits. Both lungs are clear. The visualized skeletal structures are unremarkable. IMPRESSION: No active disease. Electronically Signed   By: Megan  Zare M.D.   On: 07/01/2024 17:45   DG Loopogram Result Date: 06/17/2024 INDICATION: Patient with a history of a radical cystectomy/ileal conduit urinary diversion with a known leak of the ileal conduit with bilateral nephrostomy tubes placed 05/04/24 as well as placement of a right retrograde ureteral stent 05/14/24. Request for loopogram to assess for further evidence of urinary leak. COMPARISON:  DG LOOPOGRAM 05/12/24 CONTRAST:  A total of 60 mL Isovue -300 administered was administered into the bilateral nephrostomy tubes as well as retrograde ureteral stent. FLUOROSCOPY TIME:  21 mGy COMPLICATIONS: None immediate. TECHNIQUE: Informed written consent was obtained from the patient after a discussion of the risks, benefits and alternatives to treatment.  Questions regarding the procedure were encouraged and answered. A timeout was performed prior to the initiation of the procedure. A pre procedural spot fluoroscopic image was obtained. The bilateral flank and external portions of existing nephrostomy catheters and right ureteral stent were prepped and sterilized. Antegrade injection of 20 mL of contrast into the existing right nephrostomy catheter demonstrated appropriate positioning within the renal pelvis. After several minutes, no contrast was identified within the right ureter or ileal conduit. Antegrade injection of 20 mL of contrast into the existing left nephrostomy catheter demonstrated appropriate positioning within the renal  pelvis. After several minutes, a small volume of contrast was noted to flow through the left ureter into the ileal conduit. Retrograde injection of the right ureteral stent opacified the right ureter and right renal collecting system. No contrast extravasation observed. Temporary dressing applied to the ileal conduit ostomy site. Bilateral nephrostomy tubes were capped as requested by ordering MD. The patient tolerated the procedure well without immediate postprocedural complication. FINDINGS: The existing right nephrostomy catheter is appropriately positioned and functioning. Right retrograde ureteral stent in place through ileal conduit. IMPRESSION: 1. Right nephrostogram via the right nephrostomy tube and right ureteral stent demonstrates normally patent ureter and ureteral anastomosis with the ileal conduit. No right-sided urine leak demonstrated. 2. Left nephrostogram through left nephrostomy tube appears in good position with evidence of contrast flow to the distal ureter. No left-sided urine leak demonstrated. Performed by: Kacie Matthews PA-C Electronically Signed   By: Ester Sides M.D.   On: 06/17/2024 16:00   CT ABDOMEN PELVIS W WO CONTRAST Result Date: 06/17/2024 CLINICAL DATA:  Abdominal pain, postop, ileal conduit  leak. EXAM: CT ABDOMEN AND PELVIS WITHOUT AND WITH CONTRAST TECHNIQUE: Multidetector CT imaging of the abdomen and pelvis was performed following the standard protocol before and following the bolus administration of intravenous contrast. RADIATION DOSE REDUCTION: This exam was performed according to the departmental dose-optimization program which includes automated exposure control, adjustment of the mA and/or kV according to patient size and/or use of iterative reconstruction technique. CONTRAST:  75mL OMNIPAQUE  IOHEXOL  350 MG/ML SOLN COMPARISON:  05/12/2024. FINDINGS: Lower chest: A few tiny pulmonary nodules are unchanged from 08/29/2018 and are considered benign. Heart size normal. No pericardial effusion. No pleural effusion. Distal esophagus is grossly unremarkable. Hepatobiliary: Liver is grossly unremarkable. Cholecystectomy. No biliary ductal dilatation. Pancreas: Chronic calcific pancreatitis. 1.3 cm low-attenuation lesion in the pancreatic tail (3/21), unchanged from 08/29/2018, compatible with a pseudocyst. Spleen: Negative. Adrenals/Urinary Tract: Negative. Stomach/Bowel: Bilateral percutaneous nephrostomies with contrast in the collecting systems bilaterally, secondary to a loopogram performed prior to this study. Possible small stone in the right kidney (3/24). Persistent moderate to severe dilatation of the right intrarenal collecting system and right ureter with a double-J right ureteral stent in place, proximal loop in the right renal pelvis and distal portion the on the right lower quadrant ileal conduit. Left kidney is atrophic and scarred. Partial opacification of the left ureter which is decompressed. Ileal conduit in the right lower quadrant which may be stenotic as it traverses the abdominal wall, as on 05/12/2024. No extraluminal contrast. Cysto prostatectomy. Vascular/Lymphatic: Stomach is unremarkable. Right lower quadrant ileostomy and separate ileal conduit. Small bowel and colon are  otherwise unremarkable. Appendix is not readily visualized. Reproductive: Prostatectomy. Other: Small right inguinal hernia contains fat. Enlarging broad fluid collection along the right ventral pelvic wall, just above the ileal conduit, measuring 3.4 x 21.6 cm. A percutaneous drain is seen within. No free fluid or extraluminal contrast. Musculoskeletal: Degenerative changes in the spine. Old L1 compression fracture. IMPRESSION: 1. 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 2. Enlarging fluid collection along the right ventral pelvic abdominal wall with a percutaneous drain in place, indicative of an abscess. 3. 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 4. Right lower quadrant ileostomy. 5. Probable small right renal stone. 6. Chronic calcific pancreatitis. Electronically Signed   By: Newell Eke M.D.   On: 06/17/2024 15:47   DG Chest  2 View Result Date: 06/12/2024 CLINICAL DATA:  10031 Cough 10031 EXAM: CHEST - 2 VIEW COMPARISON:  Chest x-ray 05/26/2024, CT chest 04/14/2024 FINDINGS: The heart and mediastinal contours are within normal limits. No focal consolidation. No pulmonary edema. No pleural effusion. No pneumothorax. No acute osseous abnormality. IMPRESSION: No active cardiopulmonary disease. Electronically Signed   By: Morgane  Naveau M.D.   On: 06/12/2024 18:56     Assessment and Plan: * AKI (acute kidney injury) [facility]) Discussed with nephrology as well as urology.  They suggested uncapping the nephrostomy tubes.  This was done in the ED by the nurse.  Nephrostomy bags were replaced.  Urology will see the patient and nephrology will see the patient in the morning. They suspect obstructive uropathy which will hopefully improve with this intervention. Will check CT abdomen pelvis without contrast to ensure nothing else is going on.  Adjustment disorder with  depressed mood Per wife his mood is fairly good.  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 Surgery consult is obtained.  Obstructive sleep apnea of adult Continue home CPAP  Chronic anticoagulation INR is 1.9 On Eliquis   GERD (gastroesophageal reflux disease) MiraLAX  plus as needed  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 08/08/2017 Consulted Urology.      Advance Care Planning:   Code Status: Prior full  Consults: Urology, general surgery, nephrology  Family Communication: Wife at bedside  Severity of Illness: The appropriate patient status for this patient is INPATIENT. Inpatient status is judged to be reasonable and necessary in order to provide the required intensity of service to ensure the patient's safety. The patient's presenting symptoms, physical exam findings, and initial radiographic and laboratory data in the context of their chronic comorbidities is felt to place them at high risk for further clinical deterioration. Furthermore, it is not anticipated that the patient will be medically stable for discharge from the hospital within 2 midnights of admission.   * I certify that at the point of admission it is my clinical judgment that the patient will require inpatient hospital care spanning beyond 2 midnights from the point of admission due to high intensity of service, high risk for further deterioration and high frequency of surveillance required.*  Author: Glenys GORMAN Birk, MD 07/01/2024 8:48 PM  For on call review [email or web address].

## 2024-07-01 NOTE — Assessment & Plan Note (Signed)
 MiraLAX  plus as needed

## 2024-07-01 NOTE — Hospital Course (Signed)
 Patient is a 73 year old with complicated history of HTN, CHFpEF, CAD, T2DM, PE on Eliquis , prostate cancer, bladder cancer who was admitted back in late May for incarcerated incisional hernia and urostomy ileal conduit revision.  Postoperative course was complicated by bowel perforation, sepsis, ICU admission with intubation, requiring pressors and CRRT.  Patient had bilateral nephrostomy tubes placed as well and had been in inpatient rehab until August 8 of this year.  At that time a study was done that supported capping the nephrostomy tubes.  Patient was sent home and was doing well including ambulating on his own, was tolerating p.o., had good urine output until approximately 3 days ago.  At that time he became too weak to pull himself up in the bed or get out of bed, he had muffled voice and nausea.  The wife reports decreased urine output from about a liter a day to down to 300 in the last 24 hours.  He was brought into the ED where he was noted to have a white count of 16.9 and a low sodium at 120 a BUN of 140 and a creatinine of 3.57 up from 1.33 at time of discharge.  Due to the white count and this extensive history, he was started on sepsis protocol given fluids IV antibiotics and we were asked to admit.  Patient did have a negative chest x-ray.

## 2024-07-01 NOTE — Assessment & Plan Note (Signed)
 Hopefully this is healed Given elevated WBC and concern for infection we will check CT abdomen pelvis. Surgery consult is obtained.

## 2024-07-01 NOTE — Assessment & Plan Note (Signed)
Consulted Urology.

## 2024-07-01 NOTE — Assessment & Plan Note (Signed)
-   PT to eval and treat.

## 2024-07-01 NOTE — Consult Note (Signed)
 Urology Consult   Physician requesting consult: Dr. Fredirick  Reason for consult: complex urologic history with new AKI   History of Present Illness: Kristopher Thompson is a 73 y.o. with a very complex surgical and urologic history.   To summarize urologic and surgical hx:  10/02/2012 - RALP [facility])  08/08/2017 - [facility] (Path: [id number]) c/b rectal injury requiring loop colostomy. Course also complicated by delayed urine leak from left ureteral anastomosis (PCN and abdominal drain placed).   01/08/2018 - Colostomy reversal  12/06/2023 - Left nephrostogram nephroureteral stent removal  04/10/2024 - LOA x4 hours, robotic repairs of incisional, parastomal, left inguinal incarcerated abdominal wall hernias with mesh; and ileal conduit revision [facility])  04/20/2024 - Perforated bowel. Drainage of right abdominal wall abscess and SBR; creation of end ileostomy  04/23/2024 - Urology consulted for uptrending Cr, no concern for obstruction at this time  05/07/2024 - Drain Cr consistent with urinoma. BL nephrostomy tubes placed. Cr improving.  05/13/2024 - Antegrade nephrostograms performed with no ureteral leak.  CT loopogram performed and leak was identified at inferior butt end of ileal conduit.  06/18/2024 - Repeat Loopogram with no leak from conduit identified. Chronic ureteral stenosis and dilation, stable from prior. BL neph tubes capped. Right lower quadrant drain remained to drainage.   During last hospitalization in July, patient had uptrending Cr and ultimately was found to have a leak from distal end of conduit. Initially managed with BL nephrostomy tubes. Antegrade nephrostograms demonstrated no ureteral leak and Loopogram demonstrated likely leak from distal butt end of conduit. Cr improved with nephrostomy tubes and patient discharged with all tubes to drainage. Most recently, Loopogram from 06/17/2024 demonstrated no evidence of ileal conduit urine leak. It did re-demonstrate chronic ureteral  stenosis and dilation, stable from prior. Thus, both nephrostomy tubes were capped. Right lower quadrant drain remained to drainage.   Patient returns today for fatigue and changes in mentation. At presentation to ED, he is tachycardic to 110s and mildly hypotensive to 96/67. Labs notable for Cr 3.57 (previously ~1.3) and BUN 140. Leukocytosis with WBC 17.    Past Medical History:  Diagnosis Date   At risk for sleep apnea    12-25-2017   STOP-BANG SCORE= 5   --- SENT TO PCP   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   moderate atypia - right low back   Atypical nevus 04/04/2007   moderate to marked - right upper back (wider shave)   Atypical nevus 04/04/2007   moderate atypia - center chest (wider shave)   Atypical nevus 04/04/2007   slight atypia - right thigh   Atypical nevus 11/29/2011   mild atypia - center upper back   Atypical nevus 11/29/2011   mild atypia - center chest   Bacteremia due to Klebsiella pneumoniae 10/09/2017   Bladder cancer (HCC) dx 07/2017   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 denies  Sleep apnea    cpap   Squamous cell carcinoma of skin 05/22/2013   left cheek - CX3 + 5FU   Wears glasses     Past Surgical History:  Procedure Laterality Date   ABDOMINAL SURGERY     APPENDECTOMY  1972   BOWEL RESECTION N/A 04/20/2024   Procedure: SMALL BOWEL RESECTION;  Surgeon: [name], MD;  Location: [facility];  Service: General;   Laterality: N/A;   CHOLECYSTECTOMY  1985   COLONOSCOPY N/A 06/29/2021   Procedure: COLONOSCOPY;  Surgeon: Debby Hila, MD;  Location: [facility];  Service: Endoscopy;  Laterality: N/A;   COLOSTOMY REVERSAL N/A 01/08/2018   Procedure: COLOSTOMY REVERSAL;  Surgeon: Debby Hila, MD;  Location: [facility];  Service: General;  Laterality: N/A;   CYSTOSCOPY WITH RETROGRADE PYELOGRAM, URETEROSCOPY AND STENT PLACEMENT Right 06/10/2017   Procedure: CYSTOSCOPY WITH RIGHT URETEROSCOPY WITH RIGHT STENT PLACEMENT;  Surgeon: [name], MD;  Location: [facility];  Service: Urology;  Laterality: Right;   EUS N/A 04/16/2018   Procedure: FULL UPPER ENDOSCOPIC ULTRASOUND (EUS) RADIAL;  Surgeon: Burnette Fallow, MD;  Location: [facility];  Service: Endoscopy;  Laterality: N/A;   FLEXIBLE SIGMOIDOSCOPY N/A 12/13/2017   Procedure: FLEXIBLE SIGMOIDOSCOPY;  Surgeon: Debby Hila, MD;  Location: [facility];  Service: Endoscopy;  Laterality: N/A;   ILEO CONDUIT     IR CATHETER TUBE CHANGE  12/13/2017   IR CATHETER TUBE CHANGE  01/17/2018   IR CATHETER TUBE CHANGE  02/28/2018   IR CATHETER TUBE CHANGE  07/18/2018   IR CATHETER TUBE CHANGE  08/22/2018   IR CATHETER TUBE CHANGE  10/31/2018   IR CONVERT LEFT NEPHROSTOMY TO NEPHROURETERAL CATH  10/24/2017   IR EXT NEPHROURETERAL CATH EXCHANGE  05/19/2018   IR EXT NEPHROURETERAL CATH EXCHANGE  06/16/2018   IR NEPHRO TUBE REMOV/FL  10/24/2017   IR NEPHROSTOGRAM LEFT THRU EXISTING ACCESS  12/05/2018   IR NEPHROSTOMY EXCHANGE LEFT  05/11/2024   IR NEPHROSTOMY PLACEMENT LEFT  10/07/2017   IR NEPHROSTOMY PLACEMENT RIGHT  05/04/2024   IR NEPHROSTOMY TUBE CHANGE  04/11/2018   IR URETERAL STENT PLACEMENT EXISTING ACCESS LEFT  05/13/2024   IR URETERAL STENT RIGHT NEW ACCESS W/SEP NEPHROSTOMY CATH  05/13/2024   LAPAROTOMY N/A 04/20/2024   Procedure: EXPLORATORY LAPAROTOMY;  Surgeon: [name], MD;  Location: [facility];  Service: General;  Laterality: N/A;   PARTIAL COLECTOMY N/A 04/21/2024    Procedure: COLECTOMY, PARTIAL;  Surgeon: [name], MD;  Location: [facility];  Service: General;  Laterality: N/A;  Removal Wound Vac, Washout Ostomy, Possible Anastomosis, Possible Ileostomy.  Phasix Mesh.   POLYPECTOMY  06/29/2021   Procedure: POLYPECTOMY;  Surgeon: Debby Hila, MD;  [facility]: [facility];  Service: Endoscopy;;   ROBOT ASSISTED LAPAROSCOPIC RADICAL PROSTATECTOMY  10/02/2012   Procedure: ROBOTIC ASSISTED LAPAROSCOPIC RADICAL PROSTATECTOMY LEVEL 2;  Surgeon: Noretta Renda, MD;  Location: [facility];  Service: Urology;  Laterality: N/A;   ROBOTIC ASSISTED LAPAROSCOPIC BLADDER DIVERTICULECTOMY N/A 08/08/2017   Procedure: XI ROBOTIC ASSISTED LAPAROSCOPIC RADICAL CYSTECTOMY COVERTED TO OPEN PELVIC LYMPHADNECTOMY BILATERAL AND ILEAL CONDUIT URINARY DIVERSION;  Surgeon: [name], MD;  Location: [facility];  Service: Urology;  Laterality: N/A;   TRANSURETHRAL RESECTION OF BLADDER TUMOR  06/10/2017   Procedure: TRANSURETHRAL RESECTION OF BLADDER TUMOR (TURBT);  Surgeon: [name], MD;  Location: [facility];  Service: Urology;;   VACUUM ASSISTED CLOSURE CHANGE N/A 04/20/2024   Procedure: PLACEMENT OF CONCETTA CAPES;  Surgeon: [name], MD;  Location: [facility];  Service: General;  Laterality: N/A;   VENTRAL HERNIA REPAIR N/A 04/10/2024   Procedure: REPAIR, HERNIA, VENTRAL;  Surgeon: [name], MD;  Location: [facility];  Service: General;  Laterality: N/A;   XI ROBOT ABDOMINAL PERINEAL RESECTION N/A 08/08/2017   Procedure: REPAIR OF RECTAL TEAR POSSIBLE PARTIAL PROCTECTOMY, CREATION OF  OSTOMY;  Surgeon: Debby Hila, MD;  Location: [facility];  Service: General;  Laterality: N/A;   XI ROBOTIC ASSISTED PARASTOMAL HERNIA REPAIR N/A 04/10/2024   Procedure: REPAIR, HERNIA, PARASTOMAL AND VENTRAL HERNIAS, ROBOT-ASSISTED, LEFT INGUINAL HERNIA, LYSIS OF ADHESIONS INCARCERATED AND INSIONAL HERNIAS AND UROSTOMY REVISION;  Surgeon: [name], MD;  Location: [facility];  Service: General;  Laterality: N/A;     Current Hospital Medications:  Home Meds:  No current facility-administered medications on file prior to encounter.   Current Outpatient Medications on File Prior to Encounter  Medication Sig Dispense Refill   acetaminophen  (TYLENOL ) 325 MG tablet Take 1-2 tablets (325-650 mg total) by mouth every 4 (four) hours as needed for mild pain (pain score 1-3).     alum & mag hydroxide-simeth (MAALOX PLUS) 400-400-40 MG/5ML suspension Take 15 mLs by mouth every 4 (four) hours as needed for indigestion. 355 mL 0   cyclobenzaprine  (FLEXERIL ) 5 MG tablet Take 1 tablet (5 mg total) by mouth 3 (three) times daily as needed for muscle spasms. 20 tablet 1   cyproheptadine  (PERIACTIN ) 4 MG tablet Take 1 tablet (4 mg total) by mouth at bedtime. 30 tablet 0   ELIQUIS  5 MG TABS tablet Take 5 mg by mouth 2 (two) times daily.     feeding supplement (ENSURE PLUS HIGH PROTEIN) LIQD Take 237 mLs by mouth 2 (two) times daily between meals. (Patient not taking: Reported on 05/26/2024)     ferrous sulfate  325 (65 FE) MG tablet Take 1 tablet (325 mg total) by mouth 2 (two) times daily with a meal. 60 tablet 0   fludrocortisone  (FLORINEF ) 0.1 MG tablet Take 2 tablets (0.2 mg total) by mouth daily. 60 tablet 0   hydrOXYzine  (ATARAX ) 25 MG tablet Take 1 tablet (25 mg total) by mouth 3 (three) times daily as needed for anxiety.     ipratropium (ATROVENT ) 0.03 % nasal spray Place 1 spray into both nostrils every 8 (eight) hours as needed (allergies).     leptospermum manuka honey (MEDIHONEY) PSTE paste Apply 1 Application topically daily. 44 mL 0   loperamide  (IMODIUM ) 2 MG capsule Take 2 capsules (4 mg total) by mouth 2 (two) times daily. 30 capsule 0   melatonin 3 MG TABS tablet Take 1 tablet (3 mg total) by mouth at bedtime.     metFORMIN (GLUCOPHAGE) 500 MG tablet Take 500 mg by mouth daily.     metoCLOPramide  (REGLAN ) 5 MG tablet Take 1 tablet (5 mg total) by mouth 3 (three) times daily before meals. 90 tablet 0    midodrine  (PROAMATINE ) 5 MG tablet Take 3 tablets (15 mg total) by mouth 3 (three) times daily with meals. 270 tablet 0   Multiple Vitamin (MULTIVITAMIN PO) Take 1 tablet by mouth daily.     nutrition supplement, JUVEN, (JUVEN) PACK Take 1 packet by mouth 2 (two) times daily between meals. (Patient not taking: Reported on 05/26/2024)     Nystatin (GERHARDT'S BUTT CREAM) CREA Apply 1 Application topically 2 (two) times daily. 60 each 0   Omega-3 Fatty Acids (FISH OIL) 1000 MG CAPS Take 1,000 mg by mouth every Monday, Wednesday, and Friday.  oxyCODONE -acetaminophen  (PERCOCET) 5-325 MG tablet Take 2 tablets by mouth every 6 (six) hours as needed for severe pain (pain score 7-10). 21 tablet 0   polycarbophil (FIBERCON) 625 MG tablet Take 1 tablet (625 mg total) by mouth 2 (two) times daily. 60 tablet 2   prochlorperazine  (COMPAZINE ) 5 MG tablet Take 1 tablet (5 mg total) by mouth every 6 (six) hours as needed for nausea. 30 tablet 0   psyllium (HYDROCIL/METAMUCIL) 95 % PACK Take 1 packet by mouth daily.     rosuvastatin  (CRESTOR ) 10 MG tablet Take 1 tablet (10 mg total) by mouth every evening. 30 tablet 0   sertraline  (ZOLOFT ) 100 MG tablet Take 1 tablet (100 mg total) by mouth daily. 30 tablet 0   sodium chloride  (OCEAN) 0.65 % SOLN nasal spray Place 1-2 sprays into both nostrils every 6 (six) hours as needed for congestion (dry nose). (Patient not taking: Reported on 05/26/2024)     vitamin B-12 (CYANOCOBALAMIN ) 500 MCG tablet Take 500 mcg by mouth every Monday, Wednesday, and Friday.     ZINC OXIDE EX Apply 1 application  topically See admin instructions. Cleanse sacrum with wound cleanser; pat dry; apply zinc oxide ointment and leave open to air. Every day shift for wound care.       Scheduled Meds:  apixaban   5 mg Oral BID   cyproheptadine   4 mg Oral QHS   [START ON 07/02/2024] feeding supplement  237 mL Oral BID BM   [START ON 07/02/2024] ferrous sulfate   325 mg Oral BID WC   fludrocortisone    0.2 mg Oral Daily   Gerhardt's butt cream  1 Application Topical BID   leptospermum manuka honey  1 Application Topical Daily   loperamide   4 mg Oral BID   melatonin  3 mg Oral QHS   [START ON 07/02/2024] metoCLOPramide   5 mg Oral TID AC   [START ON 07/02/2024] midodrine   15 mg Oral TID WC   Multivitamin   Oral Daily   [START ON 07/02/2024] nutrition supplement (JUVEN)  1 packet Oral BID BM   omega-3 acid ethyl esters  1 g Oral QODAY   polycarbophil  625 mg Oral BID   psyllium  1 packet Oral Daily   rosuvastatin   10 mg Oral QPM   sertraline   100 mg Oral Daily   vitamin B-12  500 mcg Oral Q M,W,F   Continuous Infusions:  lactated ringers      And   lactated ringers      lactated ringers  150 mL/hr at 07/01/24 2043   PRN Meds:.acetaminophen , alum & mag hydroxide-simeth, cyclobenzaprine , fentaNYL  (SUBLIMAZE ) injection, hydrOXYzine , ipratropium, metoCLOPramide  (REGLAN ) injection, oxyCODONE -acetaminophen , prochlorperazine , sodium chloride   Allergies:  Allergies  Allergen Reactions   Demerol  [Meperidine ] Nausea And Vomiting    Family History  Problem Relation Age of Onset   Lung cancer Mother    Hypertension Father    Colon cancer Other    CAD Neg Hx    Diabetes Neg Hx    Stroke Neg Hx     Social History:  reports that he quit smoking about 44 years ago. His smoking use included cigarettes. He started smoking about 53 years ago. He has never used smokeless tobacco. He reports current alcohol  use. He reports that he does not use drugs.  ROS: A complete review of systems was performed.  All systems are negative except for pertinent findings as noted.  Physical Exam:  Vital signs in last 24 hours: Temp:  [97.5 F (36.4 C)] 97.5 F (  36.4 C) (08/20 1712) Pulse Rate:  [113] 113 (08/20 1712) Resp:  [14] 14 (08/20 1712) BP: (96)/(67) 96/67 (08/20 1712) Weight:  [91.9 kg] 91.9 kg (08/20 1710) Constitutional:  Alert and oriented, No acute distress Cardiovascular: Regular rate and  rhythm, No JVD Respiratory: Normal respiratory effort, Lungs clear bilaterally GI: Abdomen is soft, nontender, nondistended, no abdominal masses GU: No CVA tenderness Lymphatic: No lymphadenopathy Neurologic: Grossly intact, no focal deficits Psychiatric: Normal mood and affect  Laboratory Data:  Recent Labs    07/01/24 1716  WBC 16.9*  HGB 15.4  HCT 44.9  PLT 330    Recent Labs    07/01/24 1716  NA 120*  K 4.1  CL 84*  GLUCOSE 156*  BUN 140*  CALCIUM  11.1*  CREATININE 3.57*     Results for orders placed or performed during the hospital encounter of 07/01/24 (from the past 24 hours)  Comprehensive metabolic panel     Status: Abnormal   Collection Time: 07/01/24  5:16 PM  Result Value Ref Range   Sodium 120 (L) 135 - 145 mmol/L   Potassium 4.1 3.5 - 5.1 mmol/L   Chloride 84 (L) 98 - 111 mmol/L   CO2 16 (L) 22 - 32 mmol/L   Glucose, Bld 156 (H) 70 - 99 mg/dL   BUN 859 (H) 8 - 23 mg/dL   Creatinine, Ser 6.42 (H) 0.61 - 1.24 mg/dL   Calcium  11.1 (H) 8.9 - 10.3 mg/dL   Total Protein 7.5 6.5 - 8.1 g/dL   Albumin  3.7 3.5 - 5.0 g/dL   AST 25 15 - 41 U/L   ALT 34 0 - 44 U/L   Alkaline Phosphatase 123 38 - 126 U/L   Total Bilirubin 1.1 0.0 - 1.2 mg/dL   GFR, Estimated 17 (L) >60 mL/min   Anion gap 20 (H) 5 - 15  CBC with Differential     Status: Abnormal   Collection Time: 07/01/24  5:16 PM  Result Value Ref Range   WBC 16.9 (H) 4.0 - 10.5 K/uL   RBC 5.62 4.22 - 5.81 MIL/uL   Hemoglobin 15.4 13.0 - 17.0 g/dL   HCT 55.0 60.9 - 47.9 %   MCV 79.9 (L) 80.0 - 100.0 fL   MCH 27.4 26.0 - 34.0 pg   MCHC 34.3 30.0 - 36.0 g/dL   RDW 86.1 88.4 - 84.4 %   Platelets 330 150 - 400 K/uL   nRBC 0.0 0.0 - 0.2 %   Neutrophils Relative % 80 %   Neutro Abs 13.7 (H) 1.7 - 7.7 K/uL   Lymphocytes Relative 8 %   Lymphs Abs 1.4 0.7 - 4.0 K/uL   Monocytes Relative 9 %   Monocytes Absolute 1.4 (H) 0.1 - 1.0 K/uL   Eosinophils Relative 1 %   Eosinophils Absolute 0.1 0.0 - 0.5 K/uL    Basophils Relative 1 %   Basophils Absolute 0.1 0.0 - 0.1 K/uL   Immature Granulocytes 1 %   Abs Immature Granulocytes 0.13 (H) 0.00 - 0.07 K/uL  Protime-INR     Status: Abnormal   Collection Time: 07/01/24  5:16 PM  Result Value Ref Range   Prothrombin Time 22.5 (H) 11.4 - 15.2 seconds   INR 1.9 (H) 0.8 - 1.2  I-Stat Lactic Acid, ED     Status: None   Collection Time: 07/01/24  6:08 PM  Result Value Ref Range   Lactic Acid, Venous 1.9 0.5 - 1.9 mmol/L   No results found for this  or any previous visit (from the past 240 hours).  Renal Function: Recent Labs    07/01/24 1716  CREATININE 3.57*   Estimated Creatinine Clearance: 21.7 mL/min (A) (by C-G formula based on SCr of 3.57 mg/dL (H)).  Radiologic Imaging: DG Chest Port 1 View if patient is in a treatment room. Result Date: 07/01/2024 CLINICAL DATA: Suspected sepsis EXAM: PORTABLE CHEST 1 VIEW COMPARISON:  Chest radiograph June 12, 2024. FINDINGS: The heart size and mediastinal contours are within normal limits. Both lungs are clear. The visualized skeletal structures are unremarkable. IMPRESSION: No active disease. Electronically Signed   By: Megan  Zare M.D.   On: 07/01/2024 17:45   I independently reviewed the above imaging studies.  Impression/Recommendation 55M with complex urologic hx including RALP and subsequent [facility] in 2018 c/b rectal injury requiring loop colostomy (which was taken down in 2019). Since then, has had several additional surgeries including hernia repairs as well as creation of end ileostomy for bowel perforation. Urologically, he recently (04/2024) was found to have urine leak from butt end of ileal conduit, which was managed with bilaterally nephrostomy tubes which seemed to resolve this leak. Ultimately, nephrostomy tubes were capped earlier this month and patient now presents with acute kidney failure, fatigue, and overall failure to thrive.  - Concern for recurrence of urine leak versus obstruction in  setting of rapid rise in Cr since the BL neph tubes were capped. Recommend repeating CTAP to evaluate for new/enlarging fluid collections.  - Uncap bilateral nephrostomy tubes and leave to drainage.  - Obtain Cr from drain output to evaluate for urine leak.  - Recommend consult to general surgery.  - Keep NPO in case of need for urgent procedural intervention.   Lyle LOISE Civil 07/01/2024, 9:34 PM

## 2024-07-01 NOTE — Assessment & Plan Note (Signed)
 INR is 1.9 On Eliquis 

## 2024-07-01 NOTE — Assessment & Plan Note (Signed)
 Discussed with nephrology as well as urology.  They suggested uncapping the nephrostomy tubes.  This was done in the ED by the nurse.  Nephrostomy bags were replaced.  Urology will see the patient and nephrology will see the patient in the morning. They suspect obstructive uropathy which will hopefully improve with this intervention. Will check CT abdomen pelvis without contrast to ensure nothing else is going on.

## 2024-07-02 ENCOUNTER — Inpatient Hospital Stay (HOSPITAL_COMMUNITY)

## 2024-07-02 DIAGNOSIS — N179 Acute kidney failure, unspecified: Secondary | ICD-10-CM | POA: Diagnosis not present

## 2024-07-02 DIAGNOSIS — N1330 Unspecified hydronephrosis: Secondary | ICD-10-CM

## 2024-07-02 DIAGNOSIS — A419 Sepsis, unspecified organism: Secondary | ICD-10-CM

## 2024-07-02 DIAGNOSIS — Z7901 Long term (current) use of anticoagulants: Secondary | ICD-10-CM

## 2024-07-02 DIAGNOSIS — E871 Hypo-osmolality and hyponatremia: Secondary | ICD-10-CM

## 2024-07-02 DIAGNOSIS — G4733 Obstructive sleep apnea (adult) (pediatric): Secondary | ICD-10-CM

## 2024-07-02 DIAGNOSIS — N9989 Other postprocedural complications and disorders of genitourinary system: Secondary | ICD-10-CM

## 2024-07-02 DIAGNOSIS — Z936 Other artificial openings of urinary tract status: Secondary | ICD-10-CM

## 2024-07-02 LAB — URINALYSIS, W/ REFLEX TO CULTURE (INFECTION SUSPECTED)
Bilirubin Urine: NEGATIVE
Glucose, UA: NEGATIVE mg/dL
Ketones, ur: NEGATIVE mg/dL
Nitrite: NEGATIVE
Protein, ur: >=300 mg/dL — AB
RBC / HPF: >50 RBC/hpf (ref 0–5)
Specific Gravity, Urine: 1.012 (ref 1.005–1.030)
WBC, UA: >50 WBC/hpf (ref 0–5)
pH: 6 (ref 5.0–8.0)

## 2024-07-02 LAB — COMPREHENSIVE METABOLIC PANEL WITH GFR
ALT: 27 U/L (ref 0–44)
AST: 22 U/L (ref 15–41)
Albumin: 2.8 g/dL — ABNORMAL LOW (ref 3.5–5.0)
Alkaline Phosphatase: 89 U/L (ref 38–126)
Anion gap: 20 — ABNORMAL HIGH (ref 5–15)
BUN: 123 mg/dL — ABNORMAL HIGH (ref 8–23)
CO2: 15 mmol/L — ABNORMAL LOW (ref 22–32)
Calcium: 10.2 mg/dL (ref 8.9–10.3)
Chloride: 90 mmol/L — ABNORMAL LOW (ref 98–111)
Creatinine, Ser: 3.18 mg/dL — ABNORMAL HIGH (ref 0.61–1.24)
GFR, Estimated: 20 mL/min — ABNORMAL LOW (ref >60)
Glucose, Bld: 109 mg/dL — ABNORMAL HIGH (ref 70–99)
Potassium: 3.9 mmol/L (ref 3.5–5.1)
Sodium: 125 mmol/L — ABNORMAL LOW (ref 135–145)
Total Bilirubin: 0.7 mg/dL (ref 0.0–1.2)
Total Protein: 6 g/dL — ABNORMAL LOW (ref 6.5–8.1)

## 2024-07-02 LAB — CBC
HCT: 39.6 % (ref 39.0–52.0)
Hemoglobin: 13.7 g/dL (ref 13.0–17.0)
MCH: 27.9 pg (ref 26.0–34.0)
MCHC: 34.6 g/dL (ref 30.0–36.0)
MCV: 80.7 fL (ref 80.0–100.0)
Platelets: 243 K/uL (ref 150–400)
RBC: 4.91 MIL/uL (ref 4.22–5.81)
RDW: 13.8 % (ref 11.5–15.5)
WBC: 11.8 K/uL — ABNORMAL HIGH (ref 4.0–10.5)
nRBC: 0 % (ref 0.0–0.2)

## 2024-07-02 LAB — CREATININE, FLUID (PLEURAL, PERITONEAL, JP DRAINAGE): Creat, Fluid: 3.1 mg/dL

## 2024-07-02 LAB — PHOSPHORUS: Phosphorus: 5.5 mg/dL — ABNORMAL HIGH (ref 2.5–4.6)

## 2024-07-02 LAB — PREALBUMIN: Prealbumin: 24 mg/dL (ref 18–38)

## 2024-07-02 MED ORDER — LOPERAMIDE HCL 2 MG PO CAPS
4.0000 mg | ORAL_CAPSULE | Freq: Three times a day (TID) | ORAL | Status: DC
  Administered 2024-07-02 – 2024-07-11 (×26): 4 mg via ORAL
  Filled 2024-07-02 (×28): qty 2

## 2024-07-02 MED ORDER — VANCOMYCIN HCL 2000 MG/400ML IV SOLN
2000.0000 mg | Freq: Once | INTRAVENOUS | Status: AC
  Administered 2024-07-02: 2000 mg via INTRAVENOUS
  Filled 2024-07-02: qty 400

## 2024-07-02 MED ORDER — SODIUM CHLORIDE 0.9 % IV SOLN
2.0000 g | INTRAVENOUS | Status: DC
  Administered 2024-07-02: 2 g via INTRAVENOUS
  Filled 2024-07-02: qty 12.5

## 2024-07-02 MED ORDER — CHLORHEXIDINE GLUCONATE CLOTH 2 % EX PADS
6.0000 | MEDICATED_PAD | Freq: Every day | CUTANEOUS | Status: DC
  Administered 2024-07-02 – 2024-07-11 (×10): 6 via TOPICAL

## 2024-07-02 NOTE — Progress Notes (Signed)
 Pharmacy Antibiotic Note  Kristopher Thompson is a 73 y.o. male admitted on 07/01/2024 with pneumonia/?pyelo.  Pharmacy has been consulted for Vancomycin  dosing. WBC elevated. CT scan with ?pyelo vs PNA. AKI noted.   Plan: Vancomycin  2000 mg IV x 1, further dosing per Scr trend Cefepime  2g IV q24h Trend WBC, temp, renal function  F/U infectious work-up Drug levels as indicated   Height: 5' 11 (180.3 cm) Weight: 91.9 kg (202 lb 9.6 oz) IBW/kg (Calculated) : 75.3  Temp (24hrs), Avg:98 F (36.7 C), Min:97.5 F (36.4 C), Max:98.2 F (36.8 C)  Recent Labs  Lab 07/01/24 1716 07/01/24 1808  WBC 16.9*  --   CREATININE 3.57*  --   LATICACIDVEN  --  1.9    Estimated Creatinine Clearance: 21.7 mL/min (A) (by C-G formula based on SCr of 3.57 mg/dL (H)).    Allergies  Allergen Reactions   Demerol  [Meperidine ] Nausea And Vomiting    Lynwood Mckusick, PharmD, BCPS Clinical Pharmacist Phone: [phone]

## 2024-07-02 NOTE — Hospital Course (Addendum)
 Patient is a 73 year old with complicated history of HTN, CHFpEF, CAD, T2DM, PE on Eliquis , prostate cancer, bladder cancer who was admitted back in late May for incarcerated incisional hernia and urostomy ileal conduit revision.  Postoperative course was complicated by bowel perforation, sepsis, ICU admission with intubation, requiring pressors and CRRT.  Patient had bilateral nephrostomy tubes placed as well and had been in inpatient rehab until August 8 of this year.  At that time a study was done that supported capping the nephrostomy tubes.  Patient was sent home and was doing well including ambulating on his own, was tolerating p.o., had good urine output until approximately 3 days ago.  reports decreased urine output from about a liter a day to down to 300 in the last 24 hours.  Found to have aki and possible pyelonephritis.   Assessment and Plan:   Concern for severe sepsis-infected abdominal wall - Tachycardia, encephalopathy, attention, pyelonephritis on presentation concern for sepsis.  IV fluid bolus, blood cultures, empiric antibiotics on board.  Monitor blood pressure closely.  Etiology likely abdominal wall with drain with some purulent drainage.  This was removed by general surgery 8/21, tip Culture noting abundant e. Coli and Corynebacterium E. coli resistant to ampicillin, cefazolin , Cipro , Bactrim .  Remains on cefepime , vancomycin .  Blood cultures NGTD.  Purulence improving but still persistent.  Infectious disease consulted with precise plan (see ID note) for prolonged abx treatment course.     Acute kidney injury - Creatinine 3.57 on presentation, markedly above baseline 1-1.3.  Multi factorial etiology most likely secondary to ongoing urinary obstruction.  Percutaneous nephrostomy tubes uncapped and IV fluids on board.  Appears to be resolved at this time after IV fluid hydration.  IV fluids discontinued.  Will recheck BMP in a.m.   Concern for urinary obstruction with L>R  hydronephrosis - Nephrostomy tubes uncapped.  Evaluated by urology and following closely.  Urine output excellent bilaterally.  Per urology, right nephrostomy tube capped yesterday.  Very slight bump in urine creatinine, so watching kidney function closely.  Will recheck BMP in AM.   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  Nephrostomy tubes initially capped as urine output was good.  However given AKI and decreased urine output, now uncapped.  Evaluation by urology, general surgery.  No concern for urine leak.  Previous abdominal wall drain for bowel perforation and abscess removed by general surgery 8/21.  Culture noting abundant e. Coli and Corynebacterium E. coli resistant to ampicillin, cefazolin , Cipro , Bactrim .  Remains on cefepime , vancomycin .  Treatment duration as above.   Possible adrenal insufficiency - Noted mild hyper bow natremia with soft blood pressures.  Patient on fludrocortisone  p.o. received one-time dose of hydrocortisone  100 mg IV with minimal response.  Blood pressure appears stable.   Possible pyelonephritis - Leukocytosis, stranding noted on CT scan.  Continues on cefepime  and vanc.  Empiric urine cultures positive for MRSA as well as Enterococcus.   Obstructive sleep apnea - CPAP.   Chronic anticoagulation - Eliquis  on board   Physical debilitation muscle weakness - Evaluated by PT/OT.  Multiple hospitalizations recently.  Likely transition back to CIR.  Working closely with case management on disposition planning.

## 2024-07-02 NOTE — Progress Notes (Signed)
 07/02/2024  Kristopher Thompson [id number] [birth year]  CARE TEAM: PCP: [name], MD  Outpatient Care Team: Patient Care Team: [name], MD as PCP - General (Internal Medicine) [name], MD as Consulting Physician (Dermatology) [name], MD as Referring Physician (Pulmonary Disease) [name], MD as Consulting Physician (General Surgery) [name], MD as Consulting Physician (Urology)  Inpatient Treatment Team: Treatment Team:  Arlon Carliss LELON ROSALEA [name], MD Rayburn Pac, MD Bobbette Cedar, MD Mottinger, Vinie GAILS, PT Luvenia Fleeta HERO, NT Macel Jayson PARAS, MD Neysa Jinnie POUR, RN Carney, Harlene BROCKS, [facility] [name], MD [name], RN Waddell [name] BROCKS, RN   Problem List:   Principal Problem:   AKI (acute kidney injury) [facility]) Active Problems:   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 08/08/2017   S/P ileal conduit (HCC)   GERD (gastroesophageal reflux disease)   Sepsis (HCC)   Bilateral hydronephrosis   Sepsis due to undetermined organism [facility])   Chronic anticoagulation   Obstructive sleep apnea of adult   Delayed bowel perforation s/p SBR/end ileostomy   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   Adjustment disorder with depressed mood   Hyponatremia   04/10/2024  POST-OPERATIVE DIAGNOSIS:  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   PROCEDURE:   -ROBOTIC LYSIS OF ADHESIONS X 4 HOURS -COMPONENT SEPARATION - TRANSVERSUS ABDOMINIS REALEASE (TAR) BILATERAL -ROBOTIC REPAIRS OF  INCISIONAL, PARASTOMAL, LEFT INGUINAL  INCARCERATED ABDOMINAL WALL HERNIAS WITH MESH -UROSTOMY ILEAL CONDUIT REVISION -INTRAOPERATIVE ASSESSMENT OF TISSUE VASCULAR PERFUSION USING ICG (indocyanine green ) -IMMUNOFLUORESCENCE -TRANSVERSUS ABDOMINIS PLANE (TAP) BLOCK - BILATERAL    SURGEON:  [name], MD   04/20/2024 POST-OPERATIVE DIAGNOSIS:  perforated bowel    PROCEDURE:  Drainage of right abdominal wall abscess as well as intra-abdominal abscess Explantation of abdominal mesh Small bowel resection (in discontinuity) Placement of ABThera wound VAC   SURGEON:  [name], MD  04/21/2024  POST-OPERATIVE DIAGNOSIS:  DELAYED BOWEL PERFORATION WITH OPEN ABDOMEN   PROCEDURE:   LYSIS OF ADHESIONS X ABDOMINAL WALL DEBRIDEMENT ILEAL RESECTION END ILEOSTOMY ABDOMINAL WALL PARASTOMAL & INCISIONAL HERNIA REPAIRS WITH PHASIX MESH FASCIA CLOSURE WITH WOUND VAC PLACEMENT IN SQ   SURGEON:  [name], MD    05/04/2024 Interventional Radiology Procedure Note   Procedure: Bilateral percutaneous nephrostomy tube placements   Findings: Please refer to procedural dictation for full description. 10 Fr bilateral to bag drainage.   Complications: None immediate   Estimated Blood Loss: < 5 ml   Recommendations: Keep to bag drainage.  IR will follow.  Ultimate management as per Urology.   Assessment  -Incarcerated parastomal incisional hernias with obstructive symptoms status post robotic takedown and repair 04/10/2024 -Delayed small bowel perforation s/p ileal resection and end ileostomy 6/9-08/2024 -Delayed urine leak of urostomy ileal conduit status post perc nephrostomy tubes - HEALED 05/21/2024    Bounce back with elevated creatinine    Plan:  Delayed urine leak - RESOLVED:  Urinary diversion   Internal:  with percutaneous nephrostomy tubes.  Adjusted 6/30, 7/1, 7/3 with stent going from kidney out urostomy across the presumed mid ileal on the right side leak.  Urine draining into urostomy and right lower quadrant surgical Blake drain in the abdominal wall resting just outside the ileal conduit.  Nephrostomy tubes & stenting per [name] [facility].    Abdominal wall Blake drain creatinine in normal serous range since 05/21/2024.  Repeat 8/21 today at 3 consistent with serum  Defer  to urology if need to reposition  stenting given hydronephrosis on the left side.     Another contributor to recurrent AKI could be from ileostomy.   -Ileostomy output had been stable on fiber/iron /loperamide  twice daily.   -Patient no longer anemic = stop iron .  -Continue fiber supplement to thicken effluent.  Patient prefers psyllium over FiberCon -Since watery effluent out of right upper quadrant fecal ileostomy, increase loperamide  to 4 mg every 8 hours and see if that helps.  Strict I's and O's.  Abdominal wall drain with very low volume since been discharged according to wife (50mL past 2 weeks).  Color change to more foul odor and purulence the past few days concerning for infection.  Abdominal wall fluid collection smaller overall.  Send drain for culture and remove drain since I worry it is just a source of infection then a help.  There is no evidence of recurrent urine leak at the conduit what warrants the drain.  Low threshold to repeat CAT scan next week and if there are worsening or new fluid collections do a more focal percutaneous drainage.  Try and hold off for now.  Nutrition:  PO Diet as tolerated.  Regular diet.  Defer p.o. status to urology if they are planning to switch out or adjust stents.  His severe malnutrition is slowly been improving.  I suspect he is just mildly malnourished.  Prealbumin of 24 relatively stable from 2 weeks ago a guardedly hopeful sign.  Infection due to delayed bowel perforation and abdominal wall infection/necrosis  History of growing multiple organisms including Candida albicans, Enterobacter, and Enterococcus.    IV Zosyn /micafungin  x6 weeks, completed 7/7.    Placed on preemptive antibiotics for now.  Send abdominal drain fluid for culture.  Most likely will be contaminants but clearly abnormal where is it has been serous for 2 months.  Then I think we should pull the drain since I worry it is causing more a pathway to infection than help.  See what that shows..  Midline  incision wound     Wound very superficial now with good granulation.   May place dry dressing over it and change once a day.    Metabolic encephalopathy resolved.  Follow.  Insomnia improved  ABLA on top of anemia of chronic disease nearly normal at this point.  Hold off on extra supplementation     -monitor electrolytes & replace as needed.  Keep K>4, Mg>2, Phos>3.    -Diabetes.  Sliding scale insulin .    -VTE prophylaxis-  Full anticoagulation given history of pulmonary embolism.  Eliquis .  -Mobilize as tolerated.    I updated the patient's status to the the patient's wife and nurse.  Recommendations were made.  Questions were answered.  They expressed understanding & appreciation.  -Disposition:    I reviewed nursing notes, last 24 h vitals and pain scores, last 48 h intake and output, last 24 h labs and trends, and last 24 h imaging results.  I have reviewed this patient's available data, including medical history, events of note, test results, etc as part of my evaluation.   A significant portion of that time was spent in counseling. Care during the described time interval was provided by me.  This care required high  level of medical decision making.  07/02/2024    Subjective: (Chief complaint)   Patient asleep.  Wife in room.  Nurse just outside room.  Patient's wife notes that he had his nephrostomies capped 2 weeks ago  and was doing well until 2 days ago when he felt much worse.  Elevated creatinine.  Admitted.  CT scan raise concerns of hydronephrosis on left side.  Patient's wife notes that the right sided [address] [place] Blake surgical drain output has gone way down in the past weeks to barely 50 mL of the past 2 weeks.  However it has become more malodorous and tan-colored the past couple of days, correlating when he started to feel worse.   Objective:  Vital signs:  Vitals:   07/02/24 0000 07/02/24 0417 07/02/24 0739 07/02/24 0747  BP: 101/65 (!) 91/58  (!) 80/56 (!) 88/55  Pulse: 93 96 96 93  Resp: 16 15 17    Temp: 98 F (36.7 C) 97.8 F (36.6 C) 97.7 F (36.5 C)   TempSrc: Oral Axillary Axillary   SpO2: 98% 99% 99% 100%  Weight:  87.2 kg    Height:        Last BM Date : 07/01/24  Intake/Output   Yesterday:  08/20 0701 - 08/21 0700 In: 1479.4 [I.V.:1276.2; IV Piggyback:193.2] Out: 1250 [Urine:1050; Stool:200] This shift:  No intake/output data recorded.  Bowel function:  Flatus: YES  BM:  YES  Drains:  RLQ surgical Blake drain to biliary gravity bag drain with 10 felt scant drainage in tubing.  R & L back nephrostomy tubes in place.  -Tan orange thin effluent in both nephrostomy tubes and ileal conduit   Physical Exam:  General: Resting in no acute distress.  Legs crossed looking relaxed.  Baseline of being stoic but interactive.  Actually more chatty today..   Eyes: Glasses PERRL, normal EOM.  Sclera clear.  No icterus Neuro: CN II-XII intact w/o focal sensory/motor deficits. Lymph: No head/neck/groin lymphadenopathy Psych:  No delerium/psychosis/paranoia.  No agitation HENT: Normocephalic, Mucus membranes moist.  No thrush.  Mildly hard of hearing Neck: Supple, No tracheal deviation.  No obvious thyromegaly Chest: No pain to chest wall compression.  Good respiratory excursion.   CV:  Pulses intact.  Regular rhythm.  No extremity edema MS:  No obvious deformity  Abdomen:  Obese Soft.  Nondistended.  No tenderness nor guarding. Midline abd wound 10 x 2-4cm wide.  superifical w good granulation   Ext:   No deformity.  No edema.  No cyanosis Skin: No petechiae / purpurea.  No major sores.  Warm and dry    Results:   Cultures: Recent Results (from the past 720 hours)  Urine Culture (for pregnant, neutropenic or urologic patients or patients with an indwelling urinary catheter)     Status: Abnormal   Collection Time: 06/11/24  3:13 PM   Specimen: Urine, Bag (ped)  Result Value Ref Range Status   Specimen  Description URINE, BAG PED  Final   Special Requests   Final    NONE Performed at [facility], [address], [place], [place] [address]    Culture (A)  Final    >=100,000 COLONIES/mL KLEBSIELLA PNEUMONIAE 50,000 COLONIES/mL CITROBACTER FREUNDII    Report Status 06/15/2024 FINAL  Final   Organism ID, Bacteria KLEBSIELLA PNEUMONIAE (A)  Final   Organism ID, Bacteria CITROBACTER FREUNDII (A)  Final      Susceptibility   Citrobacter freundii - MIC*    CEFEPIME  <=0.12 SENSITIVE Sensitive     CEFTAZIDIME <=1 SENSITIVE Sensitive     CEFTRIAXONE  <=0.25 SENSITIVE Sensitive     CIPROFLOXACIN  <=0.25 SENSITIVE Sensitive     GENTAMICIN  <=1 SENSITIVE Sensitive     IMIPENEM 1  SENSITIVE Sensitive     TRIMETH /SULFA  <=20 SENSITIVE Sensitive     PIP/TAZO <=4 SENSITIVE Sensitive ug/mL    * 50,000 COLONIES/mL CITROBACTER FREUNDII   Klebsiella pneumoniae - MIC*    AMPICILLIN >=32 RESISTANT Resistant     CEFEPIME  <=0.12 SENSITIVE Sensitive     CEFTAZIDIME <=1 SENSITIVE Sensitive     CEFTRIAXONE  <=0.25 SENSITIVE Sensitive     CIPROFLOXACIN  <=0.25 SENSITIVE Sensitive     GENTAMICIN  <=1 SENSITIVE Sensitive     IMIPENEM <=0.25 SENSITIVE Sensitive     TRIMETH /SULFA  <=20 SENSITIVE Sensitive     AMPICILLIN/SULBACTAM 8 SENSITIVE Sensitive     PIP/TAZO <=4 SENSITIVE Sensitive ug/mL    * >=100,000 COLONIES/mL KLEBSIELLA PNEUMONIAE  Culture, blood (Routine x 2)     Status: None (Preliminary result)   Collection Time: 07/01/24  5:42 PM   Specimen: BLOOD  Result Value Ref Range Status   Specimen Description BLOOD LEFT ANTECUBITAL  Final   Special Requests   Final    BOTTLES DRAWN AEROBIC AND ANAEROBIC Blood Culture results may not be optimal due to an inadequate volume of blood received in culture bottles   Culture   Final    NO GROWTH < 24 HOURS Performed at [facility], [address], [place], [place] [address]    Report Status PENDING  Incomplete  Culture, blood (Routine x 2)      Status: None (Preliminary result)   Collection Time: 07/01/24  6:01 PM   Specimen: BLOOD RIGHT HAND  Result Value Ref Range Status   Specimen Description BLOOD RIGHT HAND  Final   Special Requests   Final    BOTTLES DRAWN AEROBIC AND ANAEROBIC Blood Culture results may not be optimal due to an inadequate volume of blood received in culture bottles   Culture   Final    NO GROWTH < 24 HOURS Performed at [facility], [address], [place], [place] [address]    Report Status PENDING  Incomplete     Labs: Results for orders placed or performed during the hospital encounter of 07/01/24 (from the past 48 hours)  Comprehensive metabolic panel     Status: Abnormal   Collection Time: 07/01/24  5:16 PM  Result Value Ref Range   Sodium 120 (L) 135 - 145 mmol/L   Potassium 4.1 3.5 - 5.1 mmol/L   Chloride 84 (L) 98 - 111 mmol/L   CO2 16 (L) 22 - 32 mmol/L   Glucose, Bld 156 (H) 70 - 99 mg/dL    Comment: Glucose reference range applies only to samples taken after fasting for at least 8 hours.   BUN 140 (H) 8 - 23 mg/dL   Creatinine, Ser 6.42 (H) 0.61 - 1.24 mg/dL   Calcium  11.1 (H) 8.9 - 10.3 mg/dL   Total Protein 7.5 6.5 - 8.1 g/dL   Albumin  3.7 3.5 - 5.0 g/dL   AST 25 15 - 41 U/L   ALT 34 0 - 44 U/L   Alkaline Phosphatase 123 38 - 126 U/L   Total Bilirubin 1.1 0.0 - 1.2 mg/dL   GFR, Estimated 17 (L) >60 mL/min    Comment: (NOTE) Calculated using the CKD-EPI Creatinine Equation (2021)    Anion gap 20 (H) 5 - 15    Comment: Performed at [facility], [address] [place], [place] [address]  CBC with Differential     Status: Abnormal   Collection Time: 07/01/24  5:16 PM  Result Value Ref Range  WBC 16.9 (H) 4.0 - 10.5 K/uL   RBC 5.62 4.22 - 5.81 MIL/uL   Hemoglobin 15.4 13.0 - 17.0 g/dL   HCT 55.0 60.9 - 47.9 %   MCV 79.9 (L) 80.0 - 100.0 fL   MCH 27.4 26.0 - 34.0 pg   MCHC 34.3 30.0 - 36.0 g/dL   RDW 86.1 88.4 - 84.4 %   Platelets 330 150 - 400 K/uL   nRBC 0.0  0.0 - 0.2 %   Neutrophils Relative % 80 %   Neutro Abs 13.7 (H) 1.7 - 7.7 K/uL   Lymphocytes Relative 8 %   Lymphs Abs 1.4 0.7 - 4.0 K/uL   Monocytes Relative 9 %   Monocytes Absolute 1.4 (H) 0.1 - 1.0 K/uL   Eosinophils Relative 1 %   Eosinophils Absolute 0.1 0.0 - 0.5 K/uL   Basophils Relative 1 %   Basophils Absolute 0.1 0.0 - 0.1 K/uL   Immature Granulocytes 1 %   Abs Immature Granulocytes 0.13 (H) 0.00 - 0.07 K/uL    Comment: Performed at [facility], [address], [place], [place] [address]  Protime-INR     Status: Abnormal   Collection Time: 07/01/24  5:16 PM  Result Value Ref Range   Prothrombin Time 22.5 (H) 11.4 - 15.2 seconds   INR 1.9 (H) 0.8 - 1.2    Comment: (NOTE) INR goal varies based on device and disease states. Performed at [facility], [address], [place], [place] [address]   Culture, blood (Routine x 2)     Status: None (Preliminary result)   Collection Time: 07/01/24  5:42 PM   Specimen: BLOOD  Result Value Ref Range   Specimen Description BLOOD LEFT ANTECUBITAL    Special Requests      BOTTLES DRAWN AEROBIC AND ANAEROBIC Blood Culture results may not be optimal due to an inadequate volume of blood received in culture bottles   Culture      NO GROWTH < 24 HOURS Performed at [facility], [address], [place], [place] [address]    Report Status PENDING   Culture, blood (Routine x 2)     Status: None (Preliminary result)   Collection Time: 07/01/24  6:01 PM   Specimen: BLOOD RIGHT HAND  Result Value Ref Range   Specimen Description BLOOD RIGHT HAND    Special Requests      BOTTLES DRAWN AEROBIC AND ANAEROBIC Blood Culture results may not be optimal due to an inadequate volume of blood received in culture bottles   Culture      NO GROWTH < 24 HOURS Performed at [facility], [address] [place], [place] [address]    Report Status PENDING   I-Stat Lactic Acid, ED     Status: None   Collection Time: 07/01/24   6:08 PM  Result Value Ref Range   Lactic Acid, Venous 1.9 0.5 - 1.9 mmol/L  Urinalysis, w/ Reflex to Culture (Infection Suspected) -Urine, Unspecified Source     Status: Abnormal   Collection Time: 07/01/24 10:45 PM  Result Value Ref Range   Specimen Source URINE, CATHETERIZED    Color, Urine YELLOW YELLOW   APPearance TURBID (A) CLEAR   Specific Gravity, Urine 1.012 1.005 - 1.030   pH 6.0 5.0 - 8.0   Glucose, UA NEGATIVE NEGATIVE mg/dL   Hgb urine dipstick LARGE (A) NEGATIVE   Bilirubin Urine NEGATIVE NEGATIVE   Ketones, ur NEGATIVE NEGATIVE mg/dL   Protein,  ur >=300 (A) NEGATIVE mg/dL   Nitrite NEGATIVE NEGATIVE   Leukocytes,Ua MODERATE (A) NEGATIVE   RBC / HPF >50 0 - 5 RBC/hpf   WBC, UA >50 0 - 5 WBC/hpf    Comment:        Reflex urine culture not performed if WBC <=10, OR if Squamous epithelial cells >5. If Squamous epithelial cells >5 suggest recollection.    Bacteria, UA MANY (A) NONE SEEN   Squamous Epithelial / HPF 0-5 0 - 5 /HPF   WBC Clumps PRESENT    Mucus PRESENT     Comment: Performed at [facility], [address], [place], [place] [address]  Creatinine, fluid (JP Drainage)     Status: None   Collection Time: 07/02/24 12:17 AM  Result Value Ref Range   Creat, Fluid 3.1 mg/dL    Comment: (NOTE) No normal range established for this test Results should be evaluated in conjunction with serum values    Fluid Type-FCRE JP DRAINAGE     Comment: Performed at [facility], [address] [place], [place] [address]  Comprehensive metabolic panel     Status: Abnormal   Collection Time: 07/02/24  3:16 AM  Result Value Ref Range   Sodium 125 (L) 135 - 145 mmol/L   Potassium 3.9 3.5 - 5.1 mmol/L   Chloride 90 (L) 98 - 111 mmol/L   CO2 15 (L) 22 - 32 mmol/L   Glucose, Bld 109 (H) 70 - 99 mg/dL    Comment: Glucose reference range applies only to samples taken after fasting for at least 8 hours.   BUN 123 (H) 8 - 23 mg/dL   Creatinine, Ser 6.81 (H) 0.61  - 1.24 mg/dL   Calcium  10.2 8.9 - 10.3 mg/dL   Total Protein 6.0 (L) 6.5 - 8.1 g/dL   Albumin  2.8 (L) 3.5 - 5.0 g/dL   AST 22 15 - 41 U/L   ALT 27 0 - 44 U/L   Alkaline Phosphatase 89 38 - 126 U/L   Total Bilirubin 0.7 0.0 - 1.2 mg/dL   GFR, Estimated 20 (L) >60 mL/min    Comment: (NOTE) Calculated using the CKD-EPI Creatinine Equation (2021)    Anion gap 20 (H) 5 - 15    Comment: ELECTROLYTES REPEATED TO VERIFY Performed at [facility], [address], [place], [place] [address]   CBC     Status: Abnormal   Collection Time: 07/02/24  3:16 AM  Result Value Ref Range   WBC 11.8 (H) 4.0 - 10.5 K/uL   RBC 4.91 4.22 - 5.81 MIL/uL   Hemoglobin 13.7 13.0 - 17.0 g/dL   HCT 60.3 60.9 - 47.9 %   MCV 80.7 80.0 - 100.0 fL   MCH 27.9 26.0 - 34.0 pg   MCHC 34.6 30.0 - 36.0 g/dL   RDW 86.1 88.4 - 84.4 %   Platelets 243 150 - 400 K/uL   nRBC 0.0 0.0 - 0.2 %    Comment: Performed at [facility], [address] [place], [place] [address]  Phosphorus     Status: Abnormal   Collection Time: 07/02/24  3:16 AM  Result Value Ref Range   Phosphorus 5.5 (H) 2.5 - 4.6 mg/dL    Comment: Performed at [facility], [address], [place], [place] [address]  Prealbumin     Status: None   Collection Time: 07/02/24  3:16 AM  Result Value Ref Range   Prealbumin  24 18 - 38 mg/dL    Comment: Performed at [facility], [address], [place], [place] [address]    Imaging / Studies: CT ABDOMEN PELVIS WO CONTRAST Result Date: 07/02/2024 CLINICAL DATA:  Suspected sepsis EXAM: CT ABDOMEN AND PELVIS WITHOUT CONTRAST TECHNIQUE: Multidetector CT imaging of the abdomen and pelvis was performed following the standard protocol without IV contrast. RADIATION DOSE REDUCTION: This exam was performed according to the departmental dose-optimization program which includes automated exposure control, adjustment of the mA and/or kV according to patient size and/or use of iterative reconstruction  technique. COMPARISON:  Chest x-ray 07/01/2024, CT 06/17/2024, 05/12/2024 FINDINGS: Lower chest: Lung bases demonstrates interval nodular airspace disease and ground-glass density in the left greater than right lung bases. No pleural effusion. Coronary vascular calcification. Hepatobiliary: No focal liver abnormality is seen. Status post cholecystectomy. No biliary dilatation. Pancreas: Chronic pancreatitis. No acute inflammation. Stable 10 mm low-density lesion at the distal pancreas, series 3, image 24. Spleen: Normal in size without focal abnormality. Adrenals/Urinary Tract: Adrenal glands are normal. Patient is status post cysto prostatectomy with right abdominal ileal conduit. Bilateral percutaneous nephrostomy catheters remain in place. Right-sided ureteral stent, extending from the renal pelvis to the right abdominal urostomy. Residual mild to moderate right-sided hydronephrosis but slightly diminished compared to most recent prior CT. Atrophic left kidney. Interval moderate severe left hydronephrosis and hydroureter, ureter dilated down to the ileal conduit, stricture was demonstrated on nephrostogram 05/11/2024. Small gas bubble in the left renal pelvis. Slight increased left perinephric stranding with mild stranding about the now dilated left ureter. Stomach/Bowel: Stomach is nondistended. No dilated small bowel. Contrast within the colon. No acute bowel wall thickening. Right abdominal ileal conduit. Separate right abdominal ileostomy. 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 Vascular/Lymphatic: Aortic atherosclerosis. No aneurysm. No suspicious lymph nodes. Reproductive: Prostatectomy. Other: Negative for pelvic effusion. Fat containing right inguinal hernia. Drainage catheter in the subcutaneous soft tissues of the right abdominal wall near the urostomy. Decreased fluid collection within the right ventral pelvic wall, limited characterization without contrast. Sample measurement right  laterally measures about 2.5 cm on series 3, image 66, previously 3.4 cm. Musculoskeletal: No acute osseous abnormality. Old superior endplate deformity at L1 IMPRESSION: 1. Interval development of moderate to severe left hydronephrosis and hydroureter, ureter dilated down to the ileal conduit, stricture was demonstrated on nephrostogram 05/11/2024. Slight increased left perinephric stranding with mild stranding about the now dilated left ureter, correlate for superimposed infection. Correlate also for function of the left percutaneous nephrostomy tube. 2. Bilateral percutaneous nephrostomy catheters remain in place. Right-sided ureteral stent, extending from the renal pelvis to the right abdominal urostomy. Residual mild to moderate right-sided hydronephrosis but slightly diminished compared to most recent prior CT. 3. Interval nodular airspace disease and ground-glass density in the left greater than right lung bases, suspect for pneumonia and or aspiration. 4. Drainage catheter in the subcutaneous soft tissues of the right abdominal wall near the urostomy. Decreased fluid collection within the right ventral pelvic wall, limited characterization without contrast. 5. Aortic atherosclerosis. Aortic Atherosclerosis (ICD10-I70.0). Electronically Signed   By: Luke Bun M.D.   On: 07/02/2024 01:57   DG Chest Port 1 View if patient is in a treatment room. Result Date: 07/01/2024 CLINICAL DATA: Suspected sepsis EXAM: PORTABLE CHEST 1 VIEW COMPARISON:  Chest radiograph June 12, 2024. FINDINGS: The heart size and mediastinal contours are within normal limits. Both lungs are clear. The visualized skeletal structures are unremarkable. IMPRESSION: No active disease. Electronically Signed  By: Megan  Zare M.D.   On: 07/01/2024 17:45          Medications / Allergies: per chart  Antibiotics: Anti-infectives (From admission, onward)    Start     Dose/Rate Route Frequency Ordered Stop   07/02/24 1800   ceFEPIme  (MAXIPIME ) 2 g in sodium chloride  0.9 % 100 mL IVPB        2 g 200 mL/hr over 30 Minutes Intravenous Every 24 hours 07/02/24 0323     07/02/24 0345  vancomycin  (VANCOREADY) IVPB 2000 mg/400 mL        2,000 mg 200 mL/hr over 120 Minutes Intravenous  Once 07/02/24 0324 07/02/24 0600   07/01/24 2030  ceFEPIme  (MAXIPIME ) 2 g in sodium chloride  0.9 % 100 mL IVPB        2 g 200 mL/hr over 30 Minutes Intravenous  Once 07/01/24 2020 07/01/24 2308         Note: Portions of this report may have been transcribed using voice recognition software. Every effort was made to ensure accuracy; however, inadvertent computerized transcription errors may be present.   Any transcriptional errors that result from this process are unintentional.    Elspeth KYM Schultze, MD, FACS, MASCRS Esophageal, Gastrointestinal & Colorectal Surgery Robotic and Minimally Invasive Surgery  [facility] [address], [address] [place], [place] [address] [phone] Fax [phone] Main  CONTACT INFORMATION: Weekday (9AM-5PM): Call [facility] main office at [phone] Weeknight (5PM-9AM) or Weekend/Holiday: Check EPIC Web Links tab & use AMION (password  [email or web address]) for General Surgery [facility] coverage  Please, DO NOT use SecureChat  (it is not reliable communication to reach operating surgeons & will lead to a delay in care).   Epic staff messaging available for outptient concerns needing 1-2 business day response.      07/02/2024  11:23 AM

## 2024-07-02 NOTE — Progress Notes (Signed)
 Progress Note   Patient: Kristopher Thompson FMW:[id number] DOB: 1950/11/24 DOA: 07/01/2024  DOS: the patient was seen and examined on 07/02/2024   Brief hospital course:  Patient is a 73 year old with complicated history of HTN, CHFpEF, CAD, T2DM, PE on Eliquis , prostate cancer, bladder cancer who was admitted back in late May for incarcerated incisional hernia and urostomy ileal conduit revision.  Postoperative course was complicated by bowel perforation, sepsis, ICU admission with intubation, requiring pressors and CRRT.  Patient had bilateral nephrostomy tubes placed as well and had been in inpatient rehab until August 8 of this year.  At that time a study was done that supported capping the nephrostomy tubes.  Patient was sent home and was doing well including ambulating on his own, was tolerating p.o., had good urine output until approximately 3 days ago.  reports decreased urine output from about a liter a day to down to 300 in the last 24 hours.  Found to have aki and possible pyelonephritis.  Assessment and Plan:  Acute kidney injury - Creatinine 3.57 on presentation, markedly above baseline 1.  Multi factorial etiology most likely secondary to ongoing urinary obstruction.  Percutaneous nephrostomy tubes uncapped and IV fluids on board.  Showing improvement in urine output and renal function this morning.  Will recheck BMP in AM.  Nephrology following closely.  Concern for urinary obstruction with left> right hydronephrosis - Nephrostomy tubes uncapped.  Evaluated by urology.  Currently n.p.o. pending need for urgent urological procedure.  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  Nephrostomy tubes initially capped as urine  output was good.  However given AKI and decreased urine output, now uncapped.  Evaluation by urology, general surgery.  Concern for sepsis - Tachycardia, encephalopathy, pyelonephritis on presentation concern for sepsis.  IV fluid bolus, blood cultures, empiric antibiotics on board.  Monitor blood pressure closely.  Possible pyelonephritis - Leukocytosis, stranding noted on CT scan.  Initiated on empiric cefepime  plus vancomycin .  Obstructive sleep apnea - CPAP.  Chronic anticoagulation - Eliquis  on board  Subjective: Patient resting comfortably, wife at bedside.  States he feels a little bit better and was able to sleep last night.  Denies any fever, nausea, vomiting but does feel unwell and weak.  Urine output noted in nephrostomy.  Physical Exam:  Vitals:   07/02/24 0000 07/02/24 0417 07/02/24 0739 07/02/24 0747  BP: 101/65 (!) 91/58 (!) 80/56 (!) 88/55  Pulse: 93 96 96 93  Resp: 16 15 17    Temp: 98 F (36.7 C) 97.8 F (36.6 C) 97.7 F (36.5 C)   TempSrc: Oral Axillary Axillary   SpO2: 98% 99% 99% 100%  Weight:  87.2 kg    Height:        GENERAL:  Alert, pleasant, no acute distress  HEENT:  EOMI CARDIOVASCULAR:  RRR, no murmurs appreciated RESPIRATORY:  Clear to auscultation, no wheezing, rales, or rhonchi GASTROINTESTINAL: BL nephrostomy tubes, soft, nontender, nondistended EXTREMITIES:  No LE edema bilaterally NEURO:  No new focal deficits appreciated SKIN:  No rashes noted PSYCH:  Appropriate mood and affect     Data Reviewed:  Imaging Studies: CT ABDOMEN PELVIS WO CONTRAST Result Date: 07/02/2024 CLINICAL DATA:  Suspected sepsis EXAM: CT ABDOMEN AND PELVIS WITHOUT CONTRAST TECHNIQUE: Multidetector CT imaging of the abdomen and pelvis was performed following the  standard protocol without IV contrast. RADIATION DOSE REDUCTION: This exam was performed according to the departmental dose-optimization program which includes automated exposure control, adjustment of  the mA and/or kV according to patient size and/or use of iterative reconstruction technique. COMPARISON:  Chest x-ray 07/01/2024, CT 06/17/2024, 05/12/2024 FINDINGS: Lower chest: Lung bases demonstrates interval nodular airspace disease and ground-glass density in the left greater than right lung bases. No pleural effusion. Coronary vascular calcification. Hepatobiliary: No focal liver abnormality is seen. Status post cholecystectomy. No biliary dilatation. Pancreas: Chronic pancreatitis. No acute inflammation. Stable 10 mm low-density lesion at the distal pancreas, series 3, image 24. Spleen: Normal in size without focal abnormality. Adrenals/Urinary Tract: Adrenal glands are normal. Patient is status post cysto prostatectomy with right abdominal ileal conduit. Bilateral percutaneous nephrostomy catheters remain in place. Right-sided ureteral stent, extending from the renal pelvis to the right abdominal urostomy. Residual mild to moderate right-sided hydronephrosis but slightly diminished compared to most recent prior CT. Atrophic left kidney. Interval moderate severe left hydronephrosis and hydroureter, ureter dilated down to the ileal conduit, stricture was demonstrated on nephrostogram 05/11/2024. Small gas bubble in the left renal pelvis. Slight increased left perinephric stranding with mild stranding about the now dilated left ureter. Stomach/Bowel: Stomach is nondistended. No dilated small bowel. Contrast within the colon. No acute bowel wall thickening. Right abdominal ileal conduit. Separate right abdominal ileostomy. 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 Vascular/Lymphatic: Aortic atherosclerosis. No aneurysm. No suspicious lymph nodes. Reproductive: Prostatectomy. Other: Negative for pelvic effusion. Fat containing right inguinal hernia. Drainage catheter in the subcutaneous soft tissues of the right abdominal wall near the urostomy. Decreased fluid collection within the right ventral  pelvic wall, limited characterization without contrast. Sample measurement right laterally measures about 2.5 cm on series 3, image 66, previously 3.4 cm. Musculoskeletal: No acute osseous abnormality. Old superior endplate deformity at L1 IMPRESSION: 1. Interval development of moderate to severe left hydronephrosis and hydroureter, ureter dilated down to the ileal conduit, stricture was demonstrated on nephrostogram 05/11/2024. Slight increased left perinephric stranding with mild stranding about the now dilated left ureter, correlate for superimposed infection. Correlate also for function of the left percutaneous nephrostomy tube. 2. Bilateral percutaneous nephrostomy catheters remain in place. Right-sided ureteral stent, extending from the renal pelvis to the right abdominal urostomy. Residual mild to moderate right-sided hydronephrosis but slightly diminished compared to most recent prior CT. 3. Interval nodular airspace disease and ground-glass density in the left greater than right lung bases, suspect for pneumonia and or aspiration. 4. Drainage catheter in the subcutaneous soft tissues of the right abdominal wall near the urostomy. Decreased fluid collection within the right ventral pelvic wall, limited characterization without contrast. 5. Aortic atherosclerosis. Aortic Atherosclerosis (ICD10-I70.0). Electronically Signed   By: Luke Bun M.D.   On: 07/02/2024 01:57   DG Chest Port 1 View if patient is in a treatment room. Result Date: 07/01/2024 CLINICAL DATA: Suspected sepsis EXAM: PORTABLE CHEST 1 VIEW COMPARISON:  Chest radiograph June 12, 2024. FINDINGS: The heart size and mediastinal contours are within normal limits. Both lungs are clear. The visualized skeletal structures are unremarkable. IMPRESSION: No active disease. Electronically Signed   By: Megan  Zare M.D.   On: 07/01/2024 17:45   DG Loopogram Result Date: 06/17/2024 INDICATION: Patient with a history of a radical cystectomy/ileal  conduit urinary diversion with a known leak of the ileal conduit with bilateral nephrostomy tubes placed 05/04/24 as well as placement of a right retrograde ureteral stent 05/14/24. Request for loopogram to  assess for further evidence of urinary leak. COMPARISON:  DG LOOPOGRAM 05/12/24 CONTRAST:  A total of 60 mL Isovue -300 administered was administered into the bilateral nephrostomy tubes as well as retrograde ureteral stent. FLUOROSCOPY TIME:  21 mGy COMPLICATIONS: None immediate. TECHNIQUE: Informed written consent was obtained from the patient after a discussion of the risks, benefits and alternatives to treatment. Questions regarding the procedure were encouraged and answered. A timeout was performed prior to the initiation of the procedure. A pre procedural spot fluoroscopic image was obtained. The bilateral flank and external portions of existing nephrostomy catheters and right ureteral stent were prepped and sterilized. Antegrade injection of 20 mL of contrast into the existing right nephrostomy catheter demonstrated appropriate positioning within the renal pelvis. After several minutes, no contrast was identified within the right ureter or ileal conduit. Antegrade injection of 20 mL of contrast into the existing left nephrostomy catheter demonstrated appropriate positioning within the renal pelvis. After several minutes, a small volume of contrast was noted to flow through the left ureter into the ileal conduit. Retrograde injection of the right ureteral stent opacified the right ureter and right renal collecting system. No contrast extravasation observed. Temporary dressing applied to the ileal conduit ostomy site. Bilateral nephrostomy tubes were capped as requested by ordering MD. The patient tolerated the procedure well without immediate postprocedural complication. FINDINGS: The existing right nephrostomy catheter is appropriately positioned and functioning. Right retrograde ureteral stent in place through  ileal conduit. IMPRESSION: 1. Right nephrostogram via the right nephrostomy tube and right ureteral stent demonstrates normally patent ureter and ureteral anastomosis with the ileal conduit. No right-sided urine leak demonstrated. 2. Left nephrostogram through left nephrostomy tube appears in good position with evidence of contrast flow to the distal ureter. No left-sided urine leak demonstrated. Performed by: Kacie Matthews PA-C Electronically Signed   By: Ester Sides M.D.   On: 06/17/2024 16:00   CT ABDOMEN PELVIS W WO CONTRAST Result Date: 06/17/2024 CLINICAL DATA:  Abdominal pain, postop, ileal conduit leak. EXAM: CT ABDOMEN AND PELVIS WITHOUT AND WITH CONTRAST TECHNIQUE: Multidetector CT imaging of the abdomen and pelvis was performed following the standard protocol before and following the bolus administration of intravenous contrast. RADIATION DOSE REDUCTION: This exam was performed according to the departmental dose-optimization program which includes automated exposure control, adjustment of the mA and/or kV according to patient size and/or use of iterative reconstruction technique. CONTRAST:  75mL OMNIPAQUE  IOHEXOL  350 MG/ML SOLN COMPARISON:  05/12/2024. FINDINGS: Lower chest: A few tiny pulmonary nodules are unchanged from 08/29/2018 and are considered benign. Heart size normal. No pericardial effusion. No pleural effusion. Distal esophagus is grossly unremarkable. Hepatobiliary: Liver is grossly unremarkable. Cholecystectomy. No biliary ductal dilatation. Pancreas: Chronic calcific pancreatitis. 1.3 cm low-attenuation lesion in the pancreatic tail (3/21), unchanged from 08/29/2018, compatible with a pseudocyst. Spleen: Negative. Adrenals/Urinary Tract: Negative. Stomach/Bowel: Bilateral percutaneous nephrostomies with contrast in the collecting systems bilaterally, secondary to a loopogram performed prior to this study. Possible small stone in the right kidney (3/24). Persistent moderate to severe  dilatation of the right intrarenal collecting system and right ureter with a double-J right ureteral stent in place, proximal loop in the right renal pelvis and distal portion the on the right lower quadrant ileal conduit. Left kidney is atrophic and scarred. Partial opacification of the left ureter which is decompressed. Ileal conduit in the right lower quadrant which may be stenotic as it traverses the abdominal wall, as on 05/12/2024. No extraluminal contrast. Cysto prostatectomy. Vascular/Lymphatic: Stomach is unremarkable.  Right lower quadrant ileostomy and separate ileal conduit. Small bowel and colon are otherwise unremarkable. Appendix is not readily visualized. Reproductive: Prostatectomy. Other: Small right inguinal hernia contains fat. Enlarging broad fluid collection along the right ventral pelvic wall, just above the ileal conduit, measuring 3.4 x 21.6 cm. A percutaneous drain is seen within. No free fluid or extraluminal contrast. Musculoskeletal: Degenerative changes in the spine. Old L1 compression fracture. IMPRESSION: 1. 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 2. Enlarging fluid collection along the right ventral pelvic abdominal wall with a percutaneous drain in place, indicative of an abscess. 3. 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 4. Right lower quadrant ileostomy. 5. Probable small right renal stone. 6. Chronic calcific pancreatitis. Electronically Signed   By: Newell Eke M.D.   On: 06/17/2024 15:47   DG Chest 2 View Result Date: 06/12/2024 CLINICAL DATA:  10031 Cough 10031 EXAM: CHEST - 2 VIEW COMPARISON:  Chest x-ray 05/26/2024, CT chest 04/14/2024 FINDINGS: The heart and mediastinal contours are within normal limits. No focal consolidation. No pulmonary edema. No pleural effusion. No pneumothorax. No acute osseous abnormality. IMPRESSION: No  active cardiopulmonary disease. Electronically Signed   By: Morgane  Naveau M.D.   On: 06/12/2024 18:56    There are no new results to review at this time.  Previous records (including but not limited to H&P, progress notes, nursing notes, TOC management) were reviewed in assessment of this patient.  Labs: CBC: Recent Labs  Lab 07/01/24 1716 07/02/24 0316  WBC 16.9* 11.8*  NEUTROABS 13.7*  --   HGB 15.4 13.7  HCT 44.9 39.6  MCV 79.9* 80.7  PLT 330 243   Basic Metabolic Panel: Recent Labs  Lab 07/01/24 1716 07/02/24 0316  NA 120* 125*  K 4.1 3.9  CL 84* 90*  CO2 16* 15*  GLUCOSE 156* 109*  BUN 140* 123*  CREATININE 3.57* 3.18*  CALCIUM  11.1* 10.2  PHOS  --  5.5*   Liver Function Tests: Recent Labs  Lab 07/01/24 1716 07/02/24 0316  AST 25 22  ALT 34 27  ALKPHOS 123 89  BILITOT 1.1 0.7  PROT 7.5 6.0*  ALBUMIN  3.7 2.8*   CBG: No results for input(s): GLUCAP in the last 168 hours.  Scheduled Meds:  apixaban   5 mg Oral BID   Chlorhexidine  Gluconate Cloth  6 each Topical Daily   cyanocobalamin   500 mcg Oral Q M,W,F   cyproheptadine   4 mg Oral QHS   feeding supplement  237 mL Oral BID BM   ferrous sulfate   325 mg Oral BID WC   fludrocortisone   0.2 mg Oral Daily   Gerhardt's butt cream  1 Application Topical BID   leptospermum manuka honey  1 Application Topical Daily   loperamide   4 mg Oral BID   melatonin  3 mg Oral QHS   metoCLOPramide   5 mg Oral TID AC   midodrine   15 mg Oral TID WC   multivitamin with minerals  1 tablet Oral Daily   nutrition supplement (JUVEN)  1 packet Oral BID BM   omega-3 acid ethyl esters  1 g Oral QODAY   polycarbophil  625 mg Oral BID   psyllium  1 packet Oral Daily   rosuvastatin   10 mg Oral QPM   sertraline   100 mg Oral Daily   Continuous Infusions:  ceFEPime  (MAXIPIME ) IV     lactated ringers  150  mL/hr at 07/02/24 0210   PRN Meds:.acetaminophen , alum & mag hydroxide-simeth, cyclobenzaprine , fentaNYL  (SUBLIMAZE )  injection, hydrOXYzine , ipratropium, metoCLOPramide  (REGLAN ) injection, oxyCODONE -acetaminophen , prochlorperazine , sodium chloride   Family Communication: Wife at bedside  Disposition: Status is: Inpatient Remains inpatient appropriate because: Sepsis     Time spent: 51 minutes  Length of inpatient stay: 1 days  Author: Carliss LELON Canales, DO 07/02/2024 10:16 AM  For on call review [email or web address].

## 2024-07-02 NOTE — Care Management Important Message (Signed)
 Important Message  Patient Details  Name: Kristopher Thompson MRN: [id number] Date of Birth: 27-Aug-1951   Important Message Given:  Yes - Medicare IM     Vonzell Arrie Sharps 07/02/2024, 2:17 PM

## 2024-07-02 NOTE — Plan of Care (Addendum)
 CT abdomen pelvis showing left-sided hydronephrosis and hydroureter associated with perinephric is standing concern for acute pyelonephritis. -Also CT scan showing concern for ground glass opacity of the right lung base concern for pneumonia versus aspiration.  -In the ED patient received cefepime  one-time dose however it has not been continued.  -Continue both IV vancomycin  and cefepime  for the management of pneumonia and acute pyelonephritis. -Need to follow-up with urine culture, blood culture, sputum culture results.  Vasti Yagi, MD [facility] 07/02/2024, 3:21 AM

## 2024-07-02 NOTE — Consult Note (Signed)
 WOC Nurse Consult Note: Reason for Consult:ostomy, wound to buttocks  Wound type: Stage 3 pressure injury to R buttocks Surgical wound to midline abdomen Pressure Injury POA: Yes Measurement: Buttocks: see nursing flowsheets Abdomen: 13 cm x 4 cm x 0.5 cm Wound bed: Buttocks: red, moist, clean Abdomen: red, moist, clean, re-epithelialization noted at wound edges Drainage (amount, consistency, odor) serosanguinous Periwound: intact Dressing procedure/placement/frequency:  Abdominal wound dressing orders addressed by surgeon, see noted dates 8/21.  WOC team will defer management at this time.  Buttocks: cleanse wound with NS, pat dry.  Place Xeroform over wound bed and cover with silicone foam dressing.  Change daily.  WOC Nurse ostomy consult note Stoma type/location: RLQ ileal conduit, RUQ ileostomy Stomal assessment/size: current appliances on and intact, unable to visualize/measure Peristomal assessment: current appliances on and intact, unable to visualize Treatment options for stomal/peristomal skin: 2 inch barrier ring Output  Ileal conduit: clear yellow urine Ileostomy; liquid brown stool Ostomy pouching:1pc convex urostomy pouch Soila [phone]) with 2 barrier rings Soila # [phone]), 1pc soft convex ileostomy Soila # [phone]), ostomy powder (lawson # [phone]) urostomy drainage bag adapter (lawson # [phone]) Education provided: None- patient and spouse independent with ostomy care. Enrolled patient in [employer] DC program: Yes- prior admission   WOC team will not follow patient at this time, please re-consult if new needs arise.  Thank you,  Doyal Polite, RN, MSN, [facility] WOC Team [phone] (Available Mon-Fri [phone])

## 2024-07-02 NOTE — Progress Notes (Signed)
 Both nephrostomy tubes patent and connected to drainage bags as ordered by doctor Spratte. No initial drainage.

## 2024-07-02 NOTE — Progress Notes (Signed)
 Pt has arrived to 3E22. Alert and oriented x 4, but very lethargic.  VS stable, no signs of acute distress. Wife at the bedside. Cardiac monitor placed and CCMD called. Pt and wife instructed to use call bell for assistance, and call bell left within reach.  Will continue to monitor pt, and treat per MD orders.

## 2024-07-02 NOTE — Consult Note (Signed)
 Consulting Physician: Deward PARAS Dorathea Faerber  Referring Provider: Dr. Fredirick  Chief Complaint: Post-op Problems  Reason for Consult: Post-op Problems   Subjective   HPI: Kristopher Thompson is an 73 y.o. male who is well known to our service.  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  He has bilateral percutaneous nephrostomy tubes to manage a urine leak, which were recently capped.  He presented with obstructive reinal failure, hydronephrosis on CT and the nephostomy tubes were uncapped by the urology team with some improvement in his lab work this morning.  Past Medical History:  Diagnosis Date   At risk for sleep apnea    12-25-2017   STOP-BANG SCORE= 5   --- SENT TO PCP   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   moderate atypia - right low back   Atypical nevus 04/04/2007   moderate to marked - right upper back (wider shave)   Atypical nevus 04/04/2007   moderate atypia - center chest (wider shave)   Atypical nevus 04/04/2007   slight atypia - right thigh   Atypical nevus 11/29/2011   mild atypia - center upper back   Atypical nevus 11/29/2011   mild atypia - center chest   Bacteremia due to Klebsiella pneumoniae 10/09/2017   Bladder cancer (HCC) dx 07/2017   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 denies   Sleep apnea    cpap   Squamous cell carcinoma of skin 05/22/2013   left cheek - CX3 + 5FU   Wears  glasses     Past Surgical History:  Procedure Laterality Date   ABDOMINAL SURGERY     APPENDECTOMY  1972   BOWEL RESECTION N/A 04/20/2024   Procedure: SMALL BOWEL RESECTION;  Surgeon: [name], MD;  Location: [facility];  Service: General;  Laterality: N/A;   CHOLECYSTECTOMY  1985   COLONOSCOPY N/A 06/29/2021   Procedure: COLONOSCOPY;  Surgeon: Debby Hila, MD;  Location: [facility];  Service: Endoscopy;  Laterality: N/A;   COLOSTOMY REVERSAL N/A 01/08/2018   Procedure: COLOSTOMY REVERSAL;  Surgeon: Debby Hila, MD;  Location: [facility];  Service: General;  Laterality: N/A;   CYSTOSCOPY WITH RETROGRADE PYELOGRAM, URETEROSCOPY AND STENT PLACEMENT Right 06/10/2017   Procedure: CYSTOSCOPY WITH RIGHT URETEROSCOPY WITH RIGHT STENT PLACEMENT;  Surgeon: [name], MD;  Location: [facility];  Service: Urology;  Laterality: Right;   EUS N/A 04/16/2018   Procedure: FULL UPPER ENDOSCOPIC ULTRASOUND (EUS) RADIAL;  Surgeon: Burnette Fallow, MD;  [facility];  Service: Endoscopy;  Laterality: N/A;   FLEXIBLE SIGMOIDOSCOPY N/A 12/13/2017   Procedure: FLEXIBLE SIGMOIDOSCOPY;  Surgeon: Debby Hila, MD;  Location: [facility];  Service: Endoscopy;  Laterality: N/A;   ILEO CONDUIT     IR CATHETER TUBE CHANGE  12/13/2017   IR CATHETER TUBE CHANGE  01/17/2018   IR CATHETER TUBE CHANGE  02/28/2018  IR CATHETER TUBE CHANGE  07/18/2018   IR CATHETER TUBE CHANGE  08/22/2018   IR CATHETER TUBE CHANGE  10/31/2018   IR CONVERT LEFT NEPHROSTOMY TO NEPHROURETERAL CATH  10/24/2017   IR EXT NEPHROURETERAL CATH EXCHANGE  05/19/2018   IR EXT NEPHROURETERAL CATH EXCHANGE  06/16/2018   IR NEPHRO TUBE REMOV/FL  10/24/2017   IR NEPHROSTOGRAM LEFT THRU EXISTING ACCESS  12/05/2018   IR NEPHROSTOMY EXCHANGE LEFT  05/11/2024   IR NEPHROSTOMY PLACEMENT LEFT  10/07/2017   IR NEPHROSTOMY PLACEMENT RIGHT  05/04/2024   IR NEPHROSTOMY TUBE CHANGE  04/11/2018   IR URETERAL STENT PLACEMENT EXISTING ACCESS LEFT  05/13/2024   IR  URETERAL STENT RIGHT NEW ACCESS W/SEP NEPHROSTOMY CATH  05/13/2024   LAPAROTOMY N/A 04/20/2024   Procedure: EXPLORATORY LAPAROTOMY;  Surgeon: [name], MD;  Location: [facility];  Service: General;  Laterality: N/A;   PARTIAL COLECTOMY N/A 04/21/2024   Procedure: COLECTOMY, PARTIAL;  Surgeon: [name], MD;  Location: [facility];  Service: General;  Laterality: N/A;  Removal Wound Vac, Washout Ostomy, Possible Anastomosis, Possible Ileostomy.  Phasix Mesh.   POLYPECTOMY  06/29/2021   Procedure: POLYPECTOMY;  Surgeon: Debby Hila, MD;  [facility]: [facility];  Service: Endoscopy;;   ROBOT ASSISTED LAPAROSCOPIC RADICAL PROSTATECTOMY  10/02/2012   Procedure: ROBOTIC ASSISTED LAPAROSCOPIC RADICAL PROSTATECTOMY LEVEL 2;  Surgeon: Noretta Ferrara, MD;  Location: [facility];  Service: Urology;  Laterality: N/A;   ROBOTIC ASSISTED LAPAROSCOPIC BLADDER DIVERTICULECTOMY N/A 08/08/2017   Procedure: XI ROBOTIC ASSISTED LAPAROSCOPIC RADICAL CYSTECTOMY COVERTED TO OPEN PELVIC LYMPHADNECTOMY BILATERAL AND ILEAL CONDUIT URINARY DIVERSION;  Surgeon: Ferrara Glance, MD;  Location: [facility];  Service: Urology;  Laterality: N/A;   TRANSURETHRAL RESECTION OF BLADDER TUMOR  06/10/2017   Procedure: TRANSURETHRAL RESECTION OF BLADDER TUMOR (TURBT);  Surgeon: Ferrara Glance, MD;  Location[facility];  Service: Urology;;   VACUUM ASSISTED CLOSURE CHANGE N/A 04/20/2024   Procedure: PLACEMENT OF CONCETTA CAPES;  Surgeon: [name], MD;  Location: [facility];  Service: General;  Laterality: N/A;   VENTRAL HERNIA REPAIR N/A 04/10/2024   Procedure: REPAIR, HERNIA, VENTRAL;  Surgeon: [name], MD;  Location: [facility];  Service: General;  Laterality: N/A;   XI ROBOT ABDOMINAL PERINEAL RESECTION N/A 08/08/2017   Procedure: REPAIR OF RECTAL TEAR POSSIBLE PARTIAL PROCTECTOMY, CREATION OF  OSTOMY;  Surgeon: Debby Hila, MD;  Location: [facility];  Service: General;  Laterality: N/A;   XI ROBOTIC ASSISTED PARASTOMAL HERNIA REPAIR N/A 04/10/2024   Procedure:  REPAIR, HERNIA, PARASTOMAL AND VENTRAL HERNIAS, ROBOT-ASSISTED, LEFT INGUINAL HERNIA, LYSIS OF ADHESIONS INCARCERATED AND INSIONAL HERNIAS AND UROSTOMY REVISION;  Surgeon: [name], MD;  Location: [facility];  Service: General;  Laterality: N/A;    Family History  Problem Relation Age of Onset   Lung cancer Mother    Hypertension Father    Colon cancer Other    CAD Neg Hx    Diabetes Neg Hx    Stroke Neg Hx     Social:  reports that he quit smoking about 44 years ago. His smoking use included cigarettes. He started smoking about 53 years ago. He has never used smokeless tobacco. He reports current alcohol  use. He reports that he does not use drugs.  Allergies:  Allergies  Allergen Reactions   Demerol  [Meperidine ] Nausea And Vomiting    Medications: Current Outpatient Medications  Medication Instructions   acetaminophen  (TYLENOL ) 325-650 mg, Oral, Every 4 hours PRN   alum & mag hydroxide-simeth (MAALOX PLUS) 400-400-40 MG/5ML  suspension 15 mLs, Oral, Every 4 hours PRN   cyclobenzaprine  (FLEXERIL ) 5 mg, Oral, 3 times daily PRN   cyproheptadine  (PERIACTIN ) 4 mg, Oral, Daily at bedtime   Eliquis  5 mg, Oral, 2 times daily   feeding supplement (ENSURE PLUS HIGH PROTEIN) LIQD 237 mLs, Oral, 2 times daily between meals   FeroSul 325 mg, Oral, 2 times daily with meals   Fish Oil 1,000 mg, Oral, Every M-W-F   fludrocortisone  (FLORINEF ) 0.2 mg, Oral, Daily   hydrOXYzine  (ATARAX ) 25 mg, Oral, 3 times daily PRN   ipratropium (ATROVENT ) 0.03 % nasal spray 1 spray, Each Nare, Every 8 hours PRN   leptospermum manuka honey (MEDIHONEY) PSTE paste 1 Application, Topical, Daily   loperamide  (IMODIUM ) 4 mg, Oral, 2 times daily   melatonin 3 mg, Oral, Daily at bedtime   metFORMIN (GLUCOPHAGE) 500 mg, Oral, Daily   metoCLOPramide  (REGLAN ) 5 mg, Oral, 3 times daily before meals   midodrine  (PROAMATINE ) 15 mg, Oral, 3 times daily with meals   Multiple Vitamin (MULTIVITAMIN PO) 1 tablet, Oral, Daily    nutrition supplement, JUVEN, (JUVEN) PACK 1 packet, Oral, 2 times daily between meals   Nystatin (GERHARDT'S BUTT CREAM) CREA 1 Application, Topical, 2 times daily   oxyCODONE -acetaminophen  (PERCOCET) 5-325 MG tablet 2 tablets, Oral, Every 6 hours PRN   polycarbophil (FIBERCON) 625 mg, Oral, 2 times daily   prochlorperazine  (COMPAZINE ) 5 mg, Oral, Every 6 hours PRN   psyllium (HYDROCIL/METAMUCIL) 95 % PACK 1 packet, Oral, Daily   rosuvastatin  (CRESTOR ) 10 mg, Oral, Every evening   sertraline  (ZOLOFT ) 100 mg, Oral, Daily   sodium chloride  (OCEAN) 0.65 % SOLN nasal spray 1-2 sprays, Each Nare, Every 6 hours PRN   vitamin B-12 (CYANOCOBALAMIN ) 500 mcg, Oral, Every M-W-F   ZINC OXIDE EX 1 application , Topical, See admin instructions, Cleanse sacrum with wound cleanser; pat dry; apply zinc oxide ointment and leave open to air. Every day shift for wound care.    ROS - all of the below systems have been reviewed with the patient and positives are indicated with bold text General: chills, fever or night sweats Eyes: blurry vision or double vision ENT: epistaxis or sore throat Allergy/Immunology: itchy/watery eyes or nasal congestion Hematologic/Lymphatic: bleeding problems, blood clots or swollen lymph nodes Endocrine: temperature intolerance or unexpected weight changes Breast: new or changing breast lumps or nipple discharge Resp: cough, shortness of breath, or wheezing CV: chest pain or dyspnea on exertion GI: as per HPI GU: dysuria, trouble voiding, or hematuria MSK: joint pain or joint stiffness Neuro: TIA or stroke symptoms Derm: pruritus and skin lesion changes Psych: anxiety and depression  Objective   PE Blood pressure (!) 91/58, pulse 96, temperature 97.8 F (36.6 C), temperature source Axillary, resp. rate 15, height 5' 11 (1.803 m), weight 87.2 kg, SpO2 99%. Constitutional: NAD; conversant; no deformities Eyes: Moist conjunctiva; no lid lag; anicteric; PERRL Neck:  Trachea midline; no thyromegaly Lungs: Normal respiratory effort; no tactile fremitus CV: RRR; no palpable thrills; no pitting edema GI: Abd patient resting, abdomen not examined this AM MSK: Normal range of motion of extremities; no clubbing/cyanosis Psychiatric: Appropriate affect; alert and oriented x3 Lymphatic: No palpable cervical or axillary lymphadenopathy  Results for orders placed or performed during the hospital encounter of 07/01/24 (from the past 24 hours)  Comprehensive metabolic panel     Status: Abnormal   Collection Time: 07/01/24  5:16 PM  Result Value Ref Range   Sodium 120 (L) 135 - 145  mmol/L   Potassium 4.1 3.5 - 5.1 mmol/L   Chloride 84 (L) 98 - 111 mmol/L   CO2 16 (L) 22 - 32 mmol/L   Glucose, Bld 156 (H) 70 - 99 mg/dL   BUN 859 (H) 8 - 23 mg/dL   Creatinine, Ser 6.42 (H) 0.61 - 1.24 mg/dL   Calcium  11.1 (H) 8.9 - 10.3 mg/dL   Total Protein 7.5 6.5 - 8.1 g/dL   Albumin  3.7 3.5 - 5.0 g/dL   AST 25 15 - 41 U/L   ALT 34 0 - 44 U/L   Alkaline Phosphatase 123 38 - 126 U/L   Total Bilirubin 1.1 0.0 - 1.2 mg/dL   GFR, Estimated 17 (L) >60 mL/min   Anion gap 20 (H) 5 - 15  CBC with Differential     Status: Abnormal   Collection Time: 07/01/24  5:16 PM  Result Value Ref Range   WBC 16.9 (H) 4.0 - 10.5 K/uL   RBC 5.62 4.22 - 5.81 MIL/uL   Hemoglobin 15.4 13.0 - 17.0 g/dL   HCT 55.0 60.9 - 47.9 %   MCV 79.9 (L) 80.0 - 100.0 fL   MCH 27.4 26.0 - 34.0 pg   MCHC 34.3 30.0 - 36.0 g/dL   RDW 86.1 88.4 - 84.4 %   Platelets 330 150 - 400 K/uL   nRBC 0.0 0.0 - 0.2 %   Neutrophils Relative % 80 %   Neutro Abs 13.7 (H) 1.7 - 7.7 K/uL   Lymphocytes Relative 8 %   Lymphs Abs 1.4 0.7 - 4.0 K/uL   Monocytes Relative 9 %   Monocytes Absolute 1.4 (H) 0.1 - 1.0 K/uL   Eosinophils Relative 1 %   Eosinophils Absolute 0.1 0.0 - 0.5 K/uL   Basophils Relative 1 %   Basophils Absolute 0.1 0.0 - 0.1 K/uL   Immature Granulocytes 1 %   Abs Immature Granulocytes 0.13 (H) 0.00 -  0.07 K/uL  Protime-INR     Status: Abnormal   Collection Time: 07/01/24  5:16 PM  Result Value Ref Range   Prothrombin Time 22.5 (H) 11.4 - 15.2 seconds   INR 1.9 (H) 0.8 - 1.2  I-Stat Lactic Acid, ED     Status: None   Collection Time: 07/01/24  6:08 PM  Result Value Ref Range   Lactic Acid, Venous 1.9 0.5 - 1.9 mmol/L  Urinalysis, w/ Reflex to Culture (Infection Suspected) -Urine, Unspecified Source     Status: Abnormal   Collection Time: 07/01/24 10:45 PM  Result Value Ref Range   Specimen Source URINE, CATHETERIZED    Color, Urine YELLOW YELLOW   APPearance TURBID (A) CLEAR   Specific Gravity, Urine 1.012 1.005 - 1.030   pH 6.0 5.0 - 8.0   Glucose, UA NEGATIVE NEGATIVE mg/dL   Hgb urine dipstick LARGE (A) NEGATIVE   Bilirubin Urine NEGATIVE NEGATIVE   Ketones, ur NEGATIVE NEGATIVE mg/dL   Protein, ur >=699 (A) NEGATIVE mg/dL   Nitrite NEGATIVE NEGATIVE   Leukocytes,Ua MODERATE (A) NEGATIVE   RBC / HPF >50 0 - 5 RBC/hpf   WBC, UA >50 0 - 5 WBC/hpf   Bacteria, UA MANY (A) NONE SEEN   Squamous Epithelial / HPF 0-5 0 - 5 /HPF   WBC Clumps PRESENT    Mucus PRESENT   Creatinine, fluid (JP Drainage)     Status: None   Collection Time: 07/02/24 12:17 AM  Result Value Ref Range   Creat, Fluid 3.1 mg/dL   Fluid Type-FCRE  JP DRAINAGE   Comprehensive metabolic panel     Status: Abnormal   Collection Time: 07/02/24  3:16 AM  Result Value Ref Range   Sodium 125 (L) 135 - 145 mmol/L   Potassium 3.9 3.5 - 5.1 mmol/L   Chloride 90 (L) 98 - 111 mmol/L   CO2 15 (L) 22 - 32 mmol/L   Glucose, Bld 109 (H) 70 - 99 mg/dL   BUN 876 (H) 8 - 23 mg/dL   Creatinine, Ser 6.81 (H) 0.61 - 1.24 mg/dL   Calcium  10.2 8.9 - 10.3 mg/dL   Total Protein 6.0 (L) 6.5 - 8.1 g/dL   Albumin  2.8 (L) 3.5 - 5.0 g/dL   AST 22 15 - 41 U/L   ALT 27 0 - 44 U/L   Alkaline Phosphatase 89 38 - 126 U/L   Total Bilirubin 0.7 0.0 - 1.2 mg/dL   GFR, Estimated 20 (L) >60 mL/min   Anion gap 20 (H) 5 - 15  CBC      Status: Abnormal   Collection Time: 07/02/24  3:16 AM  Result Value Ref Range   WBC 11.8 (H) 4.0 - 10.5 K/uL   RBC 4.91 4.22 - 5.81 MIL/uL   Hemoglobin 13.7 13.0 - 17.0 g/dL   HCT 60.3 60.9 - 47.9 %   MCV 80.7 80.0 - 100.0 fL   MCH 27.9 26.0 - 34.0 pg   MCHC 34.6 30.0 - 36.0 g/dL   RDW 86.1 88.4 - 84.4 %   Platelets 243 150 - 400 K/uL   nRBC 0.0 0.0 - 0.2 %     Imaging Orders         DG Chest Port 1 View if patient is in a treatment room.         CT ABDOMEN PELVIS WO CONTRAST      Assessment and Plan   Kristopher Thompson is an 73 y.o. male with multiple recent surgeries related to a parastomal hernia at urostomy site.  He presented in renal failure after urostomy tubes were clamped.  Urology has placed them back to drainage and kidney function is improving.  I will discuss the case with Dr. Sheldon and general surgery team will follow along.    Deward JINNY Foy, MD  [facility] Use [email or web address] to contact on call provider

## 2024-07-02 NOTE — Consult Note (Signed)
 Nephrology Consult   Requesting provider: Carliss Canales Service requesting consult: Hospitalist Reason for consult: AKI   Assessment/Recommendations: Kristopher Thompson is a/an 73 y.o. male with a past medical history HTN, DM2, CAD, CHF, urinary obstruction with nephrostomy tubes, recent bowel perforation w/ ileal resection and end ileostomy who present w/ sepsis c/b AKI   AKI: Baseline creatinine around 1.3.  Creatinine already improving with hydration and reversal of hydronephrosis -Continue IV fluids -Continue to monitor daily Cr, Dose meds for GFR -Monitor Daily I/Os, Daily weight  -Maintain MAP>65 for optimal renal perfusion.  -Avoid nephrotoxic medications including NSAIDs -Use synthetic opioids (Fentanyl /Dilaudid ) if needed - Obstruction as below  Urinary obstruction: Complicated course in the past.  Nephrostomy tubes now uncapped.  Appreciate urology insight  Sepsis: Concerning for urinary source given obstruction.  Antibiotics and management per primary team.  On midodrine .  Hyponatremia: Likely associated with AKI and dehydration.  Has improved significantly with IV hydration alone.  Goal sodium around 128 tomorrow evening.  Low risk for complications regarding correction  Metabolic acidosis: Associated with AKI and obstruction.  Improving already.  Continue with hydration with lactated Ringer 's  Hypercalcemia: Present on admission.  Improving.  Possibly related to immobility or dehydration itself.  Consider further workup based on trend.     Recommendations conveyed to primary service.    Jayson JINNY Player [facility] 07/02/2024 7:09 AM   _____________________________________________________________________________________ CC: weaknness  History of Present Illness: Kristopher Thompson is a/an 73 y.o. male with a past medical history of HTN, DM2, CAD, CHF, urinary obstruction with nephrostomy tubes, recent bowel perforation w/ ileal resection and end  ileostomy who present w/ weakness  Patient presented to the hospital yesterday for worsening weakness.  The patient was recently discharged in early August after an extensive hospitalization for a bowel perforation, incarcerated incisional hernia that required ileal resection and end ileostomy.  Hospitalization was also complicated by sepsis complicated by ATN requiring renal replacement therapy for an extended period of time.  The patient also had obstruction requiring nephrostomy tube placement.  Before the patient discharged on August 8 he had a study done that supported capping of the nephrostomy tubes.  Wife stated that he had been doing well until about 3 days ago when he became more weak and his urine output began to decrease.  He also had some nausea and vomiting.  Denied known fevers, chest pain, shortness of breath.  In the emergency department was found to have leukocytosis and hyponatremia as well as a creatinine of 3.6.  Had been 1.3 at discharge.  Since admission the patient has received IV fluids and his sodium has improved to 125 and creatinine has improved to 3.2.  Imaging demonstrated moderate to severe left hydronephrosis and hydroureter.  Urology recommended uncapping the nephrostomy tubes.  The patient received antibiotics as well.  Patient states he is tired today but otherwise no complaints.   Medications:  Current Facility-Administered Medications  Medication Dose Route Frequency Provider Last Rate Last Admin   acetaminophen  (TYLENOL ) tablet 325-650 mg  325-650 mg Oral Q4H PRN Pratt, Tanya S, MD       alum & mag hydroxide-simeth (MAALOX/MYLANTA) 200-200-20 MG/5ML suspension 15 mL  15 mL Oral Q4H PRN Pratt, Tanya S, MD       apixaban  (ELIQUIS ) tablet 5 mg  5 mg Oral BID Pratt, Tanya S, MD   5 mg at 07/02/24 0037   ceFEPIme  (MAXIPIME ) 2 g in sodium chloride  0.9 % 100 mL IVPB  2 g Intravenous Q24H Clair Lynwood CROME, RPH       Chlorhexidine  Gluconate Cloth 2 % PADS 6 each  6 each  Topical Daily Arlon Honey W, DO       cyclobenzaprine  (FLEXERIL ) tablet 5 mg  5 mg Oral TID PRN Pratt, Tanya S, MD       cyproheptadine  (PERIACTIN ) 4 MG tablet 4 mg  4 mg Oral QHS Pratt, Tanya S, MD       feeding supplement (ENSURE PLUS HIGH PROTEIN) liquid 237 mL  237 mL Oral BID BM Pratt, Tanya S, MD       fentaNYL  (SUBLIMAZE ) injection 12.5-50 mcg  12.5-50 mcg Intravenous Q2H PRN Pratt, Tanya S, MD       ferrous sulfate  tablet 325 mg  325 mg Oral BID WC Pratt, Tanya S, MD       fludrocortisone  (FLORINEF ) tablet 0.2 mg  0.2 mg Oral Daily Fredirick Glenys RAMAN, MD       Gerhardt's butt cream 1 Application  1 Application Topical BID Fredirick Glenys RAMAN, MD       hydrOXYzine  (ATARAX ) tablet 25 mg  25 mg Oral TID PRN Pratt, Tanya S, MD       ipratropium (ATROVENT ) 0.03 % nasal spray 1 spray  1 spray Each Nare Q8H PRN Fredirick Glenys RAMAN, MD       lactated ringers  infusion   Intravenous Continuous Fredirick Glenys RAMAN, MD 150 mL/hr at 07/02/24 0210 New Bag at 07/02/24 0210   leptospermum manuka honey (MEDIHONEY) paste 1 Application  1 Application Topical Daily Fredirick Glenys RAMAN, MD       loperamide  (IMODIUM ) capsule 4 mg  4 mg Oral BID Pratt, Tanya S, MD       melatonin tablet 3 mg  3 mg Oral QHS Pratt, Tanya S, MD       metoCLOPramide  (REGLAN ) injection 10 mg  10 mg Intravenous Q6H PRN Pratt, Tanya S, MD       metoCLOPramide  (REGLAN ) tablet 5 mg  5 mg Oral TID AC Fredirick Glenys RAMAN, MD       midodrine  (PROAMATINE ) tablet 15 mg  15 mg Oral TID WC Pratt, Tanya S, MD       multivitamin with minerals tablet 1 tablet  1 tablet Oral Daily Fredirick Glenys RAMAN, MD       nutrition supplement (JUVEN) (JUVEN) powder packet 1 packet  1 packet Oral BID BM Pratt, Tanya S, MD       omega-3 acid ethyl esters (LOVAZA ) capsule 1 g  1 g Oral QODAY Pratt, Tanya S, MD       oxyCODONE -acetaminophen  (PERCOCET/ROXICET) 5-325 MG per tablet 2 tablet  2 tablet Oral Q6H PRN Pratt, Tanya S, MD       polycarbophil (FIBERCON) tablet 625 mg  625 mg Oral BID  Pratt, Tanya S, MD       prochlorperazine  (COMPAZINE ) tablet 5 mg  5 mg Oral Q6H PRN Pratt, Tanya S, MD       psyllium (HYDROCIL/METAMUCIL) 1 packet  1 packet Oral Daily Fredirick Glenys RAMAN, MD       rosuvastatin  (CRESTOR ) tablet 10 mg  10 mg Oral QPM Fredirick Glenys RAMAN, MD       sertraline  (ZOLOFT ) tablet 100 mg  100 mg Oral Daily Fredirick Glenys RAMAN, MD       sodium chloride  (OCEAN) 0.65 % nasal spray 1-2 spray  1-2 spray Each Nare Q6H PRN Fredirick Glenys RAMAN, MD  vitamin B-12 (CYANOCOBALAMIN ) tablet 500 mcg  500 mcg Oral Q M,W,F Fredirick Glenys RAMAN, MD         ALLERGIES Demerol  [meperidine ]  MEDICAL HISTORY Past Medical History:  Diagnosis Date   At risk for sleep apnea    12-25-2017   STOP-BANG SCORE= 5   --- SENT TO PCP   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   moderate atypia - right low back   Atypical nevus 04/04/2007   moderate to marked - right upper back (wider shave)   Atypical nevus 04/04/2007   moderate atypia - center chest (wider shave)   Atypical nevus 04/04/2007   slight atypia - right thigh   Atypical nevus 11/29/2011   mild atypia - center upper back   Atypical nevus 11/29/2011   mild atypia - center chest   Bacteremia due to Klebsiella pneumoniae 10/09/2017   Bladder cancer (HCC) dx 07/2017   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 denies   Sleep apnea    cpap   Squamous cell carcinoma of skin 05/22/2013   left cheek - CX3 + 5FU   Wears glasses       SOCIAL HISTORY Social History   Socioeconomic History   Marital status: Married    Spouse name: Not on file   Number of children: Not on file   Years of education: Not on file   Highest education level: Not on file  Occupational History   Not on file  Tobacco Use   Smoking status: Former    Current packs/day: 0.00    Types: Cigarettes    Start date: 12/24/1970    Quit date: 12/25/1979    Years since quitting: 44.5   Smokeless tobacco: Never  Vaping Use   Vaping status: Never Used  Substance and Sexual Activity   Alcohol  use: Yes    Comment: rare   Drug use: No   Sexual activity: Yes  Other Topics Concern   Not on file  Social History Narrative   Not on file   Social Drivers of Health   Financial Resource Strain: Not on file  Food Insecurity: No Food Insecurity (07/02/2024)   Hunger Vital Sign    Worried About Running Out of Food in the Last Year: Never true    Ran Out of Food in the Last Year: Never true  Transportation Needs: No Transportation Needs (07/02/2024)   PRAPARE - Administrator, Civil Service (Medical): No    Lack of Transportation (Non-Medical): No  Physical Activity: Not on file  Stress: Not on file  Social Connections: Socially Integrated (07/02/2024)   Social Connection and Isolation Panel    Frequency of Communication with Friends and Family: More than three times a week    Frequency of Social Gatherings with Friends and Family: Three times a week    Attends Religious Services: More than 4 times per year  Active Member of Clubs or Organizations: Yes    Attends Banker Meetings: 1 to 4 times per year    Marital Status: Married  Catering manager Violence: Not At Risk (07/02/2024)   Humiliation, Afraid, Rape, and Kick questionnaire    Fear of Current or Ex-Partner: No    Emotionally Abused: No    Physically Abused: No    Sexually Abused: No     FAMILY HISTORY Family History  Problem Relation Age of Onset   Lung  cancer Mother    Hypertension Father    Colon cancer Other    CAD Neg Hx    Diabetes Neg Hx    Stroke Neg Hx       Review of Systems: 12 systems reviewed Otherwise as per HPI, all other systems reviewed and negative  Physical Exam: Vitals:   07/02/24 0000 07/02/24 0417  BP: 101/65 (!) 91/58  Pulse: 93 96  Resp: 16 15  Temp: 98 F (36.7 C) 97.8 F (36.6 C)  SpO2: 98% 99%   No intake/output data recorded.  Intake/Output Summary (Last 24 hours) at 07/02/2024 9290 Last data filed at 07/02/2024 0654 Gross per 24 hour  Intake 1479.36 ml  Output 1250 ml  Net 229.36 ml   General: tired-appearing, no acute distress HEENT: anicteric sclera, oropharynx clear without lesions CV: regular rate, no rub, trace LEE Lungs: clear to auscultation bilaterally, normal work of breathing Abd: soft, non-tender, non-distended Skin: no visible lesions or rashes Psych: alert, engaged, appropriate mood and affect Musculoskeletal: no obvious deformities Neuro: tired but normal speech, no gross focal deficits   Test Results Reviewed Lab Results  Component Value Date   NA 125 (L) 07/02/2024   K 3.9 07/02/2024   CL 90 (L) 07/02/2024   CO2 15 (L) 07/02/2024   BUN 123 (H) 07/02/2024   CREATININE 3.18 (H) 07/02/2024   CALCIUM  10.2 07/02/2024   ALBUMIN  2.8 (L) 07/02/2024   PHOS 2.9 05/31/2024    CBC Recent Labs  Lab 07/01/24 1716 07/02/24 0316  WBC 16.9* 11.8*  NEUTROABS 13.7*  --   HGB 15.4 13.7  HCT 44.9 39.6  MCV 79.9* 80.7  PLT 330 243    I have reviewed all relevant outside healthcare records related to the patient's current hospitalization

## 2024-07-03 DIAGNOSIS — N1330 Unspecified hydronephrosis: Secondary | ICD-10-CM | POA: Diagnosis not present

## 2024-07-03 DIAGNOSIS — C679 Malignant neoplasm of bladder, unspecified: Secondary | ICD-10-CM

## 2024-07-03 DIAGNOSIS — A419 Sepsis, unspecified organism: Secondary | ICD-10-CM | POA: Diagnosis not present

## 2024-07-03 DIAGNOSIS — N179 Acute kidney failure, unspecified: Secondary | ICD-10-CM | POA: Diagnosis not present

## 2024-07-03 LAB — BASIC METABOLIC PANEL WITH GFR
Anion gap: 14 (ref 5–15)
BUN: 89 mg/dL — ABNORMAL HIGH (ref 8–23)
CO2: 18 mmol/L — ABNORMAL LOW (ref 22–32)
Calcium: 9.7 mg/dL (ref 8.9–10.3)
Chloride: 99 mmol/L (ref 98–111)
Creatinine, Ser: 2.12 mg/dL — ABNORMAL HIGH (ref 0.61–1.24)
GFR, Estimated: 32 mL/min — ABNORMAL LOW (ref >60)
Glucose, Bld: 84 mg/dL (ref 70–99)
Potassium: 3.3 mmol/L — ABNORMAL LOW (ref 3.5–5.1)
Sodium: 131 mmol/L — ABNORMAL LOW (ref 135–145)

## 2024-07-03 LAB — CBC
HCT: 38 % — ABNORMAL LOW (ref 39.0–52.0)
Hemoglobin: 12.5 g/dL — ABNORMAL LOW (ref 13.0–17.0)
MCH: 27.4 pg (ref 26.0–34.0)
MCHC: 32.9 g/dL (ref 30.0–36.0)
MCV: 83.2 fL (ref 80.0–100.0)
Platelets: 186 K/uL (ref 150–400)
RBC: 4.57 MIL/uL (ref 4.22–5.81)
RDW: 13.9 % (ref 11.5–15.5)
WBC: 8 K/uL (ref 4.0–10.5)
nRBC: 0 % (ref 0.0–0.2)

## 2024-07-03 LAB — MAGNESIUM: Magnesium: 2.3 mg/dL (ref 1.7–2.4)

## 2024-07-03 LAB — VANCOMYCIN, RANDOM: Vancomycin Rm: 18 ug/mL

## 2024-07-03 MED ORDER — POTASSIUM CHLORIDE CRYS ER 20 MEQ PO TBCR
40.0000 meq | EXTENDED_RELEASE_TABLET | Freq: Once | ORAL | Status: AC
  Administered 2024-07-03: 40 meq via ORAL
  Filled 2024-07-03: qty 2

## 2024-07-03 MED ORDER — ORAL CARE MOUTH RINSE: 15.0000 mL | OROMUCOSAL | Status: DC | PRN

## 2024-07-03 MED ORDER — LACTATED RINGERS IV BOLUS
1000.0000 mL | Freq: Once | INTRAVENOUS | Status: AC
  Administered 2024-07-03: 1000 mL via INTRAVENOUS

## 2024-07-03 MED ORDER — ORAL CARE MOUTH RINSE
15.0000 mL | OROMUCOSAL | Status: DC
  Administered 2024-07-03 – 2024-07-11 (×30): 15 mL via OROMUCOSAL

## 2024-07-03 MED ORDER — LACTATED RINGERS IV SOLN: INTRAVENOUS | Status: AC

## 2024-07-03 MED ORDER — VANCOMYCIN HCL IN DEXTROSE 1-5 GM/200ML-% IV SOLN
1000.0000 mg | INTRAVENOUS | Status: DC
  Administered 2024-07-03 – 2024-07-05 (×3): 1000 mg via INTRAVENOUS
  Filled 2024-07-03 (×4): qty 200

## 2024-07-03 MED ORDER — SODIUM CHLORIDE 0.9 % IV SOLN
2.0000 g | Freq: Two times a day (BID) | INTRAVENOUS | Status: DC
  Administered 2024-07-03 – 2024-07-06 (×7): 2 g via INTRAVENOUS
  Filled 2024-07-03 (×7): qty 12.5

## 2024-07-03 MED ORDER — LINEZOLID 600 MG/300ML IV SOLN
600.0000 mg | Freq: Two times a day (BID) | INTRAVENOUS | Status: DC
  Filled 2024-07-03 (×2): qty 300

## 2024-07-03 NOTE — Progress Notes (Addendum)
 Progress Note   Patient: Kristopher Thompson FMW:[id number] DOB: 1951/02/22 DOA: 07/01/2024  DOS: the patient was seen and examined on 07/03/2024   Brief hospital course:  Patient is a 73 year old with complicated history of HTN, CHFpEF, CAD, T2DM, PE on Eliquis , prostate cancer, bladder cancer who was admitted back in late May for incarcerated incisional hernia and urostomy ileal conduit revision.  Postoperative course was complicated by bowel perforation, sepsis, ICU admission with intubation, requiring pressors and CRRT.  Patient had bilateral nephrostomy tubes placed as well and had been in inpatient rehab until August 8 of this year.  At that time a study was done that supported capping the nephrostomy tubes.  Patient was sent home and was doing well including ambulating on his own, was tolerating p.o., had good urine output until approximately 3 days ago.  reports decreased urine output from about a liter a day to down to 300 in the last 24 hours.  Found to have aki and possible pyelonephritis.   Assessment and Plan:   Acute kidney injury - Creatinine 3.57 on presentation, markedly above baseline 1.  Multi factorial etiology most likely secondary to ongoing urinary obstruction.  Percutaneous nephrostomy tubes uncapped and IV fluids on board.  Showing improvement in urine output and renal function this morning, creatinine down to 2.12.  Will recheck BMP in AM.  Nephrology following closely.  IV fluids ordered.   Concern for urinary obstruction with left> right hydronephrosis - Nephrostomy tubes uncapped.  Evaluated by urology and appreciate their input.     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  Nephrostomy tubes initially  capped as urine output was good.  However given AKI and decreased urine output, now uncapped.  Evaluation by urology, general surgery.   Concern for sepsis - Tachycardia, encephalopathy, pyelonephritis on presentation concern for sepsis.  IV fluid bolus, blood cultures, empiric antibiotics on board.  Monitor blood pressure closely.  Etiology of infectious source include possible pyelonephritis as well as abdominal wall drain with some purulent drainage.  This was removed by general surgery yesterday, tip sent for culture.  Blood cultures NGTD.  Will order 1 more bolus empirically.   Possible pyelonephritis - Leukocytosis, stranding noted on CT scan.  Initiated on empiric cefepime  and vanc.  Empiric urine cultures positive for MRSA as well as Enterococcus.   Obstructive sleep apnea - CPAP.   Chronic anticoagulation - Eliquis  on board   Subjective: Patient resting comfortably this morning, wife at bedside.  Patient still feels weak and malaise, ill-appearing.  No fevers overnight.  Systolic blood pressures somewhat soft in the 90s.  Urine output adequate but noted left nephrostomy tube has more output than right.  Physical Exam:  Vitals:   07/03/24 0408 07/03/24 0619 07/03/24 0744 07/03/24 1049  BP: (!) 86/51 (!) 97/58 (!) 94/58 90/62  Pulse: 65 66 68 67  Resp: 15  17 17   Temp: 97.7 F (36.5 C)  97.7 F (36.5 C) 97.6 F (36.4 C)  TempSrc: Axillary  Oral Oral  SpO2: 98% 100% 97% 98%  Weight: 87.3 kg     Height:        GENERAL:  Alert, pleasant, ill-appearing, lethargic HEENT:  EOMI CARDIOVASCULAR:  RRR, no murmurs appreciated RESPIRATORY:  Clear to auscultation, no wheezing, rales, or rhonchi GASTROINTESTINAL: BL nephrostomy tubes, soft,  nontender, nondistended EXTREMITIES:  No LE edema bilaterally NEURO:  No new focal deficits appreciated SKIN:  No rashes noted PSYCH:  Appropriate mood and affect    Data Reviewed:  Imaging Studies: CT ABDOMEN PELVIS WO CONTRAST Result  Date: 07/02/2024 CLINICAL DATA:  Suspected sepsis EXAM: CT ABDOMEN AND PELVIS WITHOUT CONTRAST TECHNIQUE: Multidetector CT imaging of the abdomen and pelvis was performed following the standard protocol without IV contrast. RADIATION DOSE REDUCTION: This exam was performed according to the departmental dose-optimization program which includes automated exposure control, adjustment of the mA and/or kV according to patient size and/or use of iterative reconstruction technique. COMPARISON:  Chest x-ray 07/01/2024, CT 06/17/2024, 05/12/2024 FINDINGS: Lower chest: Lung bases demonstrates interval nodular airspace disease and ground-glass density in the left greater than right lung bases. No pleural effusion. Coronary vascular calcification. Hepatobiliary: No focal liver abnormality is seen. Status post cholecystectomy. No biliary dilatation. Pancreas: Chronic pancreatitis. No acute inflammation. Stable 10 mm low-density lesion at the distal pancreas, series 3, image 24. Spleen: Normal in size without focal abnormality. Adrenals/Urinary Tract: Adrenal glands are normal. Patient is status post cysto prostatectomy with right abdominal ileal conduit. Bilateral percutaneous nephrostomy catheters remain in place. Right-sided ureteral stent, extending from the renal pelvis to the right abdominal urostomy. Residual mild to moderate right-sided hydronephrosis but slightly diminished compared to most recent prior CT. Atrophic left kidney. Interval moderate severe left hydronephrosis and hydroureter, ureter dilated down to the ileal conduit, stricture was demonstrated on nephrostogram 05/11/2024. Small gas bubble in the left renal pelvis. Slight increased left perinephric stranding with mild stranding about the now dilated left ureter. Stomach/Bowel: Stomach is nondistended. No dilated small bowel. Contrast within the colon. No acute bowel wall thickening. Right abdominal ileal conduit. Separate right abdominal ileostomy. 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 Vascular/Lymphatic: Aortic atherosclerosis. No aneurysm. No suspicious lymph nodes. Reproductive: Prostatectomy. Other: Negative for pelvic effusion. Fat containing right inguinal hernia. Drainage catheter in the subcutaneous soft tissues of the right abdominal wall near the urostomy. Decreased fluid collection within the right ventral pelvic wall, limited characterization without contrast. Sample measurement right laterally measures about 2.5 cm on series 3, image 66, previously 3.4 cm. Musculoskeletal: No acute osseous abnormality. Old superior endplate deformity at L1 IMPRESSION: 1. Interval development of moderate to severe left hydronephrosis and hydroureter, ureter dilated down to the ileal conduit, stricture was demonstrated on nephrostogram 05/11/2024. Slight increased left perinephric stranding with mild stranding about the now dilated left ureter, correlate for superimposed infection. Correlate also for function of the left percutaneous nephrostomy tube. 2. Bilateral percutaneous nephrostomy catheters remain in place. Right-sided ureteral stent, extending from the renal pelvis to the right abdominal urostomy. Residual mild to moderate right-sided hydronephrosis but slightly diminished compared to most recent prior CT. 3. Interval nodular airspace disease and ground-glass density in the left greater than right lung bases, suspect for pneumonia and or aspiration. 4. Drainage catheter in the subcutaneous soft tissues of the right abdominal wall near the urostomy. Decreased fluid collection within the right ventral pelvic wall, limited characterization without contrast. 5. Aortic atherosclerosis. Aortic Atherosclerosis (ICD10-I70.0). Electronically Signed   By: Luke Bun M.D.   On: 07/02/2024 01:57   DG Chest Port 1 View if patient is in a treatment room. Result Date: 07/01/2024 CLINICAL DATA: Suspected sepsis EXAM: PORTABLE CHEST 1 VIEW COMPARISON:   Chest radiograph June 12, 2024. FINDINGS: The heart size and mediastinal contours are within normal limits. Both lungs are clear. The visualized skeletal structures are  unremarkable. IMPRESSION: No active disease. Electronically Signed   By: Megan  Zare M.D.   On: 07/01/2024 17:45   DG Loopogram Result Date: 06/17/2024 INDICATION: Patient with a history of a radical cystectomy/ileal conduit urinary diversion with a known leak of the ileal conduit with bilateral nephrostomy tubes placed 05/04/24 as well as placement of a right retrograde ureteral stent 05/14/24. Request for loopogram to assess for further evidence of urinary leak. COMPARISON:  DG LOOPOGRAM 05/12/24 CONTRAST:  A total of 60 mL Isovue -300 administered was administered into the bilateral nephrostomy tubes as well as retrograde ureteral stent. FLUOROSCOPY TIME:  21 mGy COMPLICATIONS: None immediate. TECHNIQUE: Informed written consent was obtained from the patient after a discussion of the risks, benefits and alternatives to treatment. Questions regarding the procedure were encouraged and answered. A timeout was performed prior to the initiation of the procedure. A pre procedural spot fluoroscopic image was obtained. The bilateral flank and external portions of existing nephrostomy catheters and right ureteral stent were prepped and sterilized. Antegrade injection of 20 mL of contrast into the existing right nephrostomy catheter demonstrated appropriate positioning within the renal pelvis. After several minutes, no contrast was identified within the right ureter or ileal conduit. Antegrade injection of 20 mL of contrast into the existing left nephrostomy catheter demonstrated appropriate positioning within the renal pelvis. After several minutes, a small volume of contrast was noted to flow through the left ureter into the ileal conduit. Retrograde injection of the right ureteral stent opacified the right ureter and right renal collecting system. No  contrast extravasation observed. Temporary dressing applied to the ileal conduit ostomy site. Bilateral nephrostomy tubes were capped as requested by ordering MD. The patient tolerated the procedure well without immediate postprocedural complication. FINDINGS: The existing right nephrostomy catheter is appropriately positioned and functioning. Right retrograde ureteral stent in place through ileal conduit. IMPRESSION: 1. Right nephrostogram via the right nephrostomy tube and right ureteral stent demonstrates normally patent ureter and ureteral anastomosis with the ileal conduit. No right-sided urine leak demonstrated. 2. Left nephrostogram through left nephrostomy tube appears in good position with evidence of contrast flow to the distal ureter. No left-sided urine leak demonstrated. Performed by: Kacie Matthews PA-C Electronically Signed   By: Ester Sides M.D.   On: 06/17/2024 16:00   CT ABDOMEN PELVIS W WO CONTRAST Result Date: 06/17/2024 CLINICAL DATA:  Abdominal pain, postop, ileal conduit leak. EXAM: CT ABDOMEN AND PELVIS WITHOUT AND WITH CONTRAST TECHNIQUE: Multidetector CT imaging of the abdomen and pelvis was performed following the standard protocol before and following the bolus administration of intravenous contrast. RADIATION DOSE REDUCTION: This exam was performed according to the departmental dose-optimization program which includes automated exposure control, adjustment of the mA and/or kV according to patient size and/or use of iterative reconstruction technique. CONTRAST:  75mL OMNIPAQUE  IOHEXOL  350 MG/ML SOLN COMPARISON:  05/12/2024. FINDINGS: Lower chest: A few tiny pulmonary nodules are unchanged from 08/29/2018 and are considered benign. Heart size normal. No pericardial effusion. No pleural effusion. Distal esophagus is grossly unremarkable. Hepatobiliary: Liver is grossly unremarkable. Cholecystectomy. No biliary ductal dilatation. Pancreas: Chronic calcific pancreatitis. 1.3 cm  low-attenuation lesion in the pancreatic tail (3/21), unchanged from 08/29/2018, compatible with a pseudocyst. Spleen: Negative. Adrenals/Urinary Tract: Negative. Stomach/Bowel: Bilateral percutaneous nephrostomies with contrast in the collecting systems bilaterally, secondary to a loopogram performed prior to this study. Possible small stone in the right kidney (3/24). Persistent moderate to severe dilatation of the right intrarenal collecting system and right ureter with a double-J  right ureteral stent in place, proximal loop in the right renal pelvis and distal portion the on the right lower quadrant ileal conduit. Left kidney is atrophic and scarred. Partial opacification of the left ureter which is decompressed. Ileal conduit in the right lower quadrant which may be stenotic as it traverses the abdominal wall, as on 05/12/2024. No extraluminal contrast. Cysto prostatectomy. Vascular/Lymphatic: Stomach is unremarkable. Right lower quadrant ileostomy and separate ileal conduit. Small bowel and colon are otherwise unremarkable. Appendix is not readily visualized. Reproductive: Prostatectomy. Other: Small right inguinal hernia contains fat. Enlarging broad fluid collection along the right ventral pelvic wall, just above the ileal conduit, measuring 3.4 x 21.6 cm. A percutaneous drain is seen within. No free fluid or extraluminal contrast. Musculoskeletal: Degenerative changes in the spine. Old L1 compression fracture. IMPRESSION: 1. 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 2. Enlarging fluid collection along the right ventral pelvic abdominal wall with a percutaneous drain in place, indicative of an abscess. 3. 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 4. Right lower quadrant ileostomy. 5. Probable small right renal stone. 6. Chronic calcific pancreatitis. Electronically Signed   By:  Newell Eke M.D.   On: 06/17/2024 15:47   DG Chest 2 View Result Date: 06/12/2024 CLINICAL DATA:  10031 Cough 10031 EXAM: CHEST - 2 VIEW COMPARISON:  Chest x-ray 05/26/2024, CT chest 04/14/2024 FINDINGS: The heart and mediastinal contours are within normal limits. No focal consolidation. No pulmonary edema. No pleural effusion. No pneumothorax. No acute osseous abnormality. IMPRESSION: No active cardiopulmonary disease. Electronically Signed   By: Morgane  Naveau M.D.   On: 06/12/2024 18:56    There are no new results to review at this time.  Previous records (including but not limited to H&P, progress notes, nursing notes, TOC management) were reviewed in assessment of this patient.  Labs: CBC: Recent Labs  Lab 07/01/24 1716 07/02/24 0316 07/03/24 0835  WBC 16.9* 11.8* 8.0  NEUTROABS 13.7*  --   --   HGB 15.4 13.7 12.5*  HCT 44.9 39.6 38.0*  MCV 79.9* 80.7 83.2  PLT 330 243 186   Basic Metabolic Panel: Recent Labs  Lab 07/01/24 1716 07/02/24 0316 07/03/24 0835  NA 120* 125* 131*  K 4.1 3.9 3.3*  CL 84* 90* 99  CO2 16* 15* 18*  GLUCOSE 156* 109* 84  BUN 140* 123* 89*  CREATININE 3.57* 3.18* 2.12*  CALCIUM  11.1* 10.2 9.7  MG  --   --  2.3  PHOS  --  5.5*  --    Liver Function Tests: Recent Labs  Lab 07/01/24 1716 07/02/24 0316  AST 25 22  ALT 34 27  ALKPHOS 123 89  BILITOT 1.1 0.7  PROT 7.5 6.0*  ALBUMIN  3.7 2.8*   CBG: No results for input(s): GLUCAP in the last 168 hours.  Scheduled Meds:  apixaban   5 mg Oral BID   Chlorhexidine  Gluconate Cloth  6 each Topical Daily   cyanocobalamin   500 mcg Oral Q M,W,F   cyproheptadine   4 mg Oral QHS   feeding supplement  237 mL Oral BID BM   fludrocortisone   0.2 mg Oral Daily   Gerhardt's butt cream  1 Application Topical BID   leptospermum manuka honey  1 Application Topical Daily   loperamide   4 mg Oral TID   melatonin  3 mg Oral QHS   metoCLOPramide   5 mg Oral TID  AC   midodrine   15 mg Oral TID WC    multivitamin with minerals  1 tablet Oral Daily   nutrition supplement (JUVEN)  1 packet Oral BID BM   omega-3 acid ethyl esters  1 g Oral QODAY   mouth rinse  15 mL Mouth Rinse 4 times per day   psyllium  1 packet Oral Daily   rosuvastatin   10 mg Oral QPM   sertraline   100 mg Oral Daily   Continuous Infusions:  ceFEPime  (MAXIPIME ) IV Stopped (07/02/24 1757)   PRN Meds:.acetaminophen , alum & mag hydroxide-simeth, cyclobenzaprine , fentaNYL  (SUBLIMAZE ) injection, hydrOXYzine , ipratropium, metoCLOPramide  (REGLAN ) injection, mouth rinse, oxyCODONE -acetaminophen , prochlorperazine , sodium chloride   Family Communication: Wife at bedside  Disposition: Status is: Inpatient Remains inpatient appropriate because: Sepsis     Time spent: 56 minutes  Length of inpatient stay: 2 days  Author: Carliss LELON Canales, DO 07/03/2024 12:45 PM  For on call review [email or web address].

## 2024-07-03 NOTE — Progress Notes (Signed)
 Nephrology Follow-Up Consult note   Assessment/Recommendations: Kristopher Thompson is a/an 72 y.o. male with a past medical history significant for HTN, DM2, CAD, CHF, urinary obstruction with nephrostomy tubes, recent bowel perforation w/ ileal resection and end ileostomy who present w/ sepsis c/b AKI    AKI: Baseline creatinine around 1.3.  Creatinine already improving with hydration and reversal of hydronephrosis.  Creatinine down to 2.1 - IV fluids at the discretion of the primary team.  Encourage oral intake -Continue to monitor daily Cr, Dose meds for GFR -Monitor Daily I/Os, Daily weight  -Maintain MAP>65 for optimal renal perfusion.  -Avoid nephrotoxic medications including NSAIDs -Use synthetic opioids (Fentanyl /Dilaudid ) if needed - Obstruction as below  Given the improving creatinine we will sign off at this time.   Urinary obstruction: Complicated course in the past.  Nephrostomy tubes now uncapped.  Appreciate urology insight   Sepsis: Concerning for urinary source given obstruction.  Antibiotics and management per primary team.  On midodrine .   Hyponatremia: Likely associated with AKI and dehydration.  Has improved significantly with IV hydration alone.  Sodium would likely improve as AKI resolves   Metabolic acidosis: Improving significantly with hydration and resolution of AKI   Hypercalcemia: Present on admission.  Was related to dehydration.  Now improved   Recommendations conveyed to primary service.    [facility] 07/03/2024 9:50 AM  ___________________________________________________________  CC: Weakness  Interval History/Subjective: Patient minimally conversant this morning.  Denies any complaints.  Wife states she thinks he is feeling better.  Some hematuria in his bags.   Medications:  Current Facility-Administered Medications  Medication Dose Route Frequency Provider Last Rate Last Admin   acetaminophen  (TYLENOL )  tablet 325-650 mg  325-650 mg Oral Q4H PRN Pratt, Tanya S, MD   650 mg at 07/03/24 0125   alum & mag hydroxide-simeth (MAALOX/MYLANTA) 200-200-20 MG/5ML suspension 15 mL  15 mL Oral Q4H PRN Pratt, Tanya S, MD       apixaban  (ELIQUIS ) tablet 5 mg  5 mg Oral BID Pratt, Tanya S, MD   5 mg at 07/03/24 9075   ceFEPIme  (MAXIPIME ) 2 g in sodium chloride  0.9 % 100 mL IVPB  2 g Intravenous Q24H Clair Lynwood CROME, [facility]   Stopped at 07/02/24 1757   Chlorhexidine  Gluconate Cloth 2 % PADS 6 each  6 each Topical Daily Arlon Carliss ORN, DO   6 each at 07/03/24 9074   cyanocobalamin  (VITAMIN B12) tablet 500 mcg  500 mcg Oral Q M,W,F Pratt, Tanya S, MD   500 mcg at 07/03/24 9075   cyclobenzaprine  (FLEXERIL ) tablet 5 mg  5 mg Oral TID PRN Pratt, Tanya S, MD       cyproheptadine  (PERIACTIN ) 4 MG tablet 4 mg  4 mg Oral QHS Pratt, Tanya S, MD   4 mg at 07/02/24 2213   feeding supplement (ENSURE PLUS HIGH PROTEIN) liquid 237 mL  237 mL Oral BID BM Pratt, Tanya S, MD       fentaNYL  (SUBLIMAZE ) injection 12.5-50 mcg  12.5-50 mcg Intravenous Q2H PRN Pratt, Tanya S, MD       fludrocortisone  (FLORINEF ) tablet 0.2 mg  0.2 mg Oral Daily Pratt, Tanya S, MD   0.2 mg at 07/03/24 9074   Gerhardt's butt cream 1 Application  1 Application Topical BID Fredirick Glenys RAMAN, MD   1 Application at 07/03/24 9076   hydrOXYzine  (ATARAX ) tablet 25 mg  25 mg Oral TID PRN Fredirick Glenys RAMAN, MD  ipratropium (ATROVENT ) 0.03 % nasal spray 1 spray  1 spray Each Nare Q8H PRN Pratt, Tanya S, MD       leptospermum manuka honey (MEDIHONEY) paste 1 Application  1 Application Topical Daily Pratt, Tanya S, MD   1 Application at 07/03/24 9076   loperamide  (IMODIUM ) capsule 4 mg  4 mg Oral TID [name], MD   4 mg at 07/03/24 9074   melatonin tablet 3 mg  3 mg Oral QHS Pratt, Tanya S, MD   3 mg at 07/03/24 0125   metoCLOPramide  (REGLAN ) injection 10 mg  10 mg Intravenous Q6H PRN Pratt, Tanya S, MD       metoCLOPramide  (REGLAN ) tablet 5 mg  5 mg Oral TID AC  Pratt, Tanya S, MD   5 mg at 07/03/24 9075   midodrine  (PROAMATINE ) tablet 15 mg  15 mg Oral TID WC Pratt, Tanya S, MD   15 mg at 07/03/24 9075   multivitamin with minerals tablet 1 tablet  1 tablet Oral Daily Pratt, Tanya S, MD       nutrition supplement (JUVEN) (JUVEN) powder packet 1 packet  1 packet Oral BID BM Pratt, Tanya S, MD       omega-3 acid ethyl esters (LOVAZA ) capsule 1 g  1 g Oral QODAY Pratt, Tanya S, MD       Oral care mouth rinse  15 mL Mouth Rinse 4 times per day Arlon Carliss ORN, DO   15 mL at 07/03/24 9074   Oral care mouth rinse  15 mL Mouth Rinse PRN Arlon Carliss ORN, DO       oxyCODONE -acetaminophen  (PERCOCET/ROXICET) 5-325 MG per tablet 2 tablet  2 tablet Oral Q6H PRN Pratt, Tanya S, MD       prochlorperazine  (COMPAZINE ) tablet 5 mg  5 mg Oral Q6H PRN Pratt, Tanya S, MD       psyllium (HYDROCIL/METAMUCIL) 1 packet  1 packet Oral Daily Fredirick Glenys RAMAN, MD       rosuvastatin  (CRESTOR ) tablet 10 mg  10 mg Oral QPM Pratt, Tanya S, MD   10 mg at 07/02/24 1727   sertraline  (ZOLOFT ) tablet 100 mg  100 mg Oral Daily Pratt, Tanya S, MD   100 mg at 07/03/24 9075   sodium chloride  (OCEAN) 0.65 % nasal spray 1-2 spray  1-2 spray Each Nare Q6H PRN Fredirick Glenys RAMAN, MD          Review of Systems: 10 systems reviewed and negative except per interval history/subjective  Physical Exam: Vitals:   07/03/24 0619 07/03/24 0744  BP: (!) 97/58 (!) 94/58  Pulse: 66 68  Resp:  17  Temp:  97.7 F (36.5 C)  SpO2: 100% 97%   No intake/output data recorded.  Intake/Output Summary (Last 24 hours) at 07/03/2024 0950 Last data filed at 07/03/2024 0636 Gross per 24 hour  Intake 1378.34 ml  Output 2850 ml  Net -1471.66 ml   Constitutional: tired-appearing, no acute distress ENMT: ears and nose without scars or lesions, MMM CV: normal rate, trace edema Respiratory: clear to auscultation, normal work of breathing Gastrointestinal: soft, non-tender, no palpable masses or hernias Skin: no  visible lesions or rashes   Test Results I personally reviewed new and old clinical labs and radiology tests Lab Results  Component Value Date   NA 131 (L) 07/03/2024   K 3.3 (L) 07/03/2024   CL 99 07/03/2024   CO2 18 (L) 07/03/2024   BUN 89 (H) 07/03/2024   CREATININE 2.12 (H)  07/03/2024   CALCIUM  9.7 07/03/2024   ALBUMIN  2.8 (L) 07/02/2024   PHOS 5.5 (H) 07/02/2024    CBC Recent Labs  Lab 07/01/24 1716 07/02/24 0316 07/03/24 0835  WBC 16.9* 11.8* 8.0  NEUTROABS 13.7*  --   --   HGB 15.4 13.7 12.5*  HCT 44.9 39.6 38.0*  MCV 79.9* 80.7 83.2  PLT 330 243 186

## 2024-07-03 NOTE — Progress Notes (Signed)
 Patient ID: Kristopher Thompson, male   DOB: 03-Oct-1951, 73 y.o.   MRN: [id number]    Subjective: Kristopher Thompson is improving since admission and hydration.  Denies pain.  More alert according to his wife.  Objective: Vital signs in last 24 hours: Temp:  [97.3 F (36.3 C)-97.8 F (36.6 C)] 97.6 F (36.4 C) (08/22 1049) Pulse Rate:  [63-71] 67 (08/22 1049) Resp:  [15-17] 17 (08/22 1049) BP: (85-97)/(49-62) 90/62 (08/22 1049) SpO2:  [97 %-100 %] 98 % (08/22 1049) FiO2 (%):  [21 %] 21 % (08/21 2355) Weight:  [87.3 kg] 87.3 kg (08/22 0408)  Intake/Output from previous day: 08/21 0701 - 08/22 0700 In: 1378.3 [I.V.:1278.3; IV Piggyback:100] Out: 2850 [Urine:1950; Drains:700; Stool:200] Intake/Output this shift: No intake/output data recorded.  Physical Exam:  General: Alert and oriented Abd: R PCN draining clear with minimal output with large amount of clear urine in urostomy bag, left PCN draining clear urine with decent amount of output. Otherwise, soft, ND, NT.  Lab Results: Recent Labs    07/01/24 1716 07/02/24 0316 07/03/24 0835  HGB 15.4 13.7 12.5*  HCT 44.9 39.6 38.0*   Lab Results  Component Value Date   WBC 8.0 07/03/2024   HGB 12.5 (L) 07/03/2024   HCT 38.0 (L) 07/03/2024   MCV 83.2 07/03/2024   PLT 186 07/03/2024     BMET Recent Labs    07/02/24 0316 07/03/24 0835  NA 125* 131*  K 3.9 3.3*  CL 90* 99  CO2 15* 18*  GLUCOSE 109* 84  BUN 123* 89*  CREATININE 3.18* 2.12*  CALCIUM  10.2 9.7     Studies/Results: CT ABDOMEN PELVIS WO CONTRAST Result Date: 07/02/2024 CLINICAL DATA:  Suspected sepsis EXAM: CT ABDOMEN AND PELVIS WITHOUT CONTRAST TECHNIQUE: Multidetector CT imaging of the abdomen and pelvis was performed following the standard protocol without IV contrast. RADIATION DOSE REDUCTION: This exam was performed according to the departmental dose-optimization program which includes automated exposure control, adjustment of the mA and/or kV according to patient  size and/or use of iterative reconstruction technique. COMPARISON:  Chest x-ray 07/01/2024, CT 06/17/2024, 05/12/2024 FINDINGS: Lower chest: Lung bases demonstrates interval nodular airspace disease and ground-glass density in the left greater than right lung bases. No pleural effusion. Coronary vascular calcification. Hepatobiliary: No focal liver abnormality is seen. Status post cholecystectomy. No biliary dilatation. Pancreas: Chronic pancreatitis. No acute inflammation. Stable 10 mm low-density lesion at the distal pancreas, series 3, image 24. Spleen: Normal in size without focal abnormality. Adrenals/Urinary Tract: Adrenal glands are normal. Patient is status post cysto prostatectomy with right abdominal ileal conduit. Bilateral percutaneous nephrostomy catheters remain in place. Right-sided ureteral stent, extending from the renal pelvis to the right abdominal urostomy. Residual mild to moderate right-sided hydronephrosis but slightly diminished compared to most recent prior CT. Atrophic left kidney. Interval moderate severe left hydronephrosis and hydroureter, ureter dilated down to the ileal conduit, stricture was demonstrated on nephrostogram 05/11/2024. Small gas bubble in the left renal pelvis. Slight increased left perinephric stranding with mild stranding about the now dilated left ureter. Stomach/Bowel: Stomach is nondistended. No dilated small bowel. Contrast within the colon. No acute bowel wall thickening. Right abdominal ileal conduit. Separate right abdominal ileostomy. 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 Vascular/Lymphatic: Aortic atherosclerosis. No aneurysm. No suspicious lymph nodes. Reproductive: Prostatectomy. Other: Negative for pelvic effusion. Fat containing right inguinal hernia. Drainage catheter in the subcutaneous soft tissues of the right abdominal wall near the urostomy. Decreased fluid collection within the right ventral pelvic wall,  limited characterization  without contrast. Sample measurement right laterally measures about 2.5 cm on series 3, image 66, previously 3.4 cm. Musculoskeletal: No acute osseous abnormality. Old superior endplate deformity at L1 IMPRESSION: 1. Interval development of moderate to severe left hydronephrosis and hydroureter, ureter dilated down to the ileal conduit, stricture was demonstrated on nephrostogram 05/11/2024. Slight increased left perinephric stranding with mild stranding about the now dilated left ureter, correlate for superimposed infection. Correlate also for function of the left percutaneous nephrostomy tube. 2. Bilateral percutaneous nephrostomy catheters remain in place. Right-sided ureteral stent, extending from the renal pelvis to the right abdominal urostomy. Residual mild to moderate right-sided hydronephrosis but slightly diminished compared to most recent prior CT. 3. Interval nodular airspace disease and ground-glass density in the left greater than right lung bases, suspect for pneumonia and or aspiration. 4. Drainage catheter in the subcutaneous soft tissues of the right abdominal wall near the urostomy. Decreased fluid collection within the right ventral pelvic wall, limited characterization without contrast. 5. Aortic atherosclerosis. Aortic Atherosclerosis (ICD10-I70.0). Electronically Signed   By: Luke Bun M.D.   On: 07/02/2024 01:57   DG Chest Port 1 View if patient is in a treatment room. Result Date: 07/01/2024 CLINICAL DATA: Suspected sepsis EXAM: PORTABLE CHEST 1 VIEW COMPARISON:  Chest radiograph June 12, 2024. FINDINGS: The heart size and mediastinal contours are within normal limits. Both lungs are clear. The visualized skeletal structures are unremarkable. IMPRESSION: No active disease. Electronically Signed   By: Megan  Zare M.D.   On: 07/01/2024 17:45    Assessment/Plan: 1) UTI/AKI/dehydration:  As a precaution, will leave bilateral PCNs to drainage over the weekend and until renal function  has returned to baseline (Cr 1.3).  This will also allow optimal treatment of his MRSA UTI.  He does not appear to be obstructed on right side but did have evidence of at least partial to near complete obstruction of left poorly functional kidney.  Will leave left PCN to drainage for now and upon hospital discharge.  AKI likely multifactorial due to UTI, dehydration, and possibly a component of obstruction.  Now improving.  2) Urine leak: Resolved. Abdominal drain removed per Dr. Sheldon and I agree.  Culture obtained and will need to be covered with antibiotics appropriately based on that polymicrobial culture.  Will defer this therapy and duration to Dr. Sheldon.   LOS: 2 days   Noretta Ferrara 07/03/2024, 1:35 PM

## 2024-07-03 NOTE — Progress Notes (Signed)
 Pharmacy Antibiotic Note  Kristopher Thompson is a 73 y.o. male admitted on 07/01/2024 with UTI/obstructed nephrostomy tube.   Pharmacy has been consulted for Vancomycin , cefepime  dosing. WBC elevated 12>8 afebrile AKI noted -improving.  Vancomycin  2gm IV x1 fiven 8/21 am with random level 8/22 am 18, MRSA in urine culture  Plan: Vancomycin  1gm IV q24h - adjust as renal function improves  Cefepime  2g IV q24h> q12h with improved renal function Trend WBC, temp, renal function  F/U infectious work-up Drug levels as indicated   Height: 5' 11 (180.3 cm) Weight: 87.3 kg (192 lb 7.4 oz) IBW/kg (Calculated) : 75.3  Temp (24hrs), Avg:97.7 F (36.5 C), Min:97.3 F (36.3 C), Max:97.8 F (36.6 C)  Recent Labs  Lab 07/01/24 1716 07/01/24 1808 07/02/24 0316 07/03/24 0835  WBC 16.9*  --  11.8* 8.0  CREATININE 3.57*  --  3.18* 2.12*  LATICACIDVEN  --  1.9  --   --   VANCORANDOM  --   --   --  18    Estimated Creatinine Clearance: 33.5 mL/min (A) (by C-G formula based on SCr of 2.12 mg/dL (H)).    Allergies  Allergen Reactions   Demerol  [Meperidine ] Nausea And Vomiting     Olam Chalk Pharm.D. CPP, BCPS Clinical Pharmacist [phone] 07/03/2024 2:28 PM

## 2024-07-03 NOTE — Evaluation (Signed)
 Physical Therapy Evaluation Patient Details Name: Kristopher Thompson MRN: [id number] DOB: Jun 21, 1951 Today's Date: 07/03/2024  History of Present Illness  Pt is a 73 y/o male admitted 8/20 from home with new onset of weakness, muffled voice and nausea after 3 days at home post recent hospital and inpatient rehab stay for bowel perforation /sepsis.  PMHx: HTN, chronic diastolic congestive heart failure, CAD, DM2, pulmonary embolism on Eliquis , anemia, obesity, prostate cancer, bladder cancer, recent admission 7/30-7/1 for left inguinal incarcerated hernia s/p robotic hernia repair, urostomy 2/ ileal conduit creation revision, bilat percutaneneous nephrostomy tubes 05/04/24.  Clinical Impression  Patient presents with decreased mobility due to generalized weakness, decreased activity tolerance, decreased balance, and decreased cognition.  Patient recently d/c from inpatient rehab after prolonged hospitalization and was mobilizing with walker well until decline 3 days prior to admission.  Currently mod to max A for mobility up to standing at bedside and able to take some side steps toward [facility].  HR up to 135 and pt fatigued though BP stable wearing thigh hi TEDS and ace wraps.  Patient appropriate for inpatient rehab prior to d/c home.  PT will continue to follow during acute stay.  Orthostatic VS for the past 24 hrs (Last 3 readings):  BP- Lying BP- Sitting BP- Standing at 0 minutes  07/03/24 1700 101/63 100/62 96/72         If plan is discharge home, recommend the following: A lot of help with walking and/or transfers;A lot of help with bathing/dressing/bathroom;Assist for transportation;Help with stairs or ramp for entrance   Can travel by private vehicle        Equipment Recommendations None recommended by PT  Recommendations for Other Services  Rehab consult    Functional Status Assessment Patient has had a recent decline in their functional status and/or demonstrates limited ability to  make significant improvements in function in a reasonable and predictable amount of time     Precautions / Restrictions Precautions Precautions: Fall Recall of Precautions/Restrictions: Impaired Precaution/Restrictions Comments: ileal conduit, ileostomy, B PCN tubes      Mobility  Bed Mobility Overal bed mobility: Needs Assistance Bed Mobility: Rolling, Supine to Sit, Sit to Supine Rolling: Mod assist, Used rails   Supine to sit: Mod assist, HOB elevated, Used rails Sit to supine: Max assist   General bed mobility comments: using HOB elevation and rail and HHA to lift trunk then assist to scoot hips to EOB, to supine A for LE's then to reposition trunk once supine then scooted up with pt using headboard bed in trendelenberg and +2 A    Transfers Overall transfer level: Needs assistance Equipment used: Rolling walker (2 wheels) Transfers: Sit to/from Stand Sit to Stand: Mod assist           General transfer comment: heavy mod A to stand x 2, less assist second attempt    Ambulation/Gait Ambulation/Gait assistance: Mod assist Gait Distance (Feet): 2 Feet Assistive device: Rolling walker (2 wheels) Gait Pattern/deviations: Step-to pattern       General Gait Details: side steps toward HOB x2 with each standing trial shuffling feet and with HR into 130's  Stairs            Wheelchair Mobility     Tilt Bed    Modified Rankin (Stroke Patients Only)       Balance Overall balance assessment: Needs assistance   Sitting balance-Leahy Scale: Poor Sitting balance - Comments: leaning back some and asking his wife to support his  back in sitting   Standing balance support: Bilateral upper extremity supported Standing balance-Leahy Scale: Poor Standing balance comment: UE support and mod A for balance in standing,                             Pertinent Vitals/Pain Pain Assessment Pain Assessment: No/denies pain    Home Living Family/patient  expects to be discharged to:: Private residence Living Arrangements: Spouse/significant other Available Help at Discharge: Family;Available 24 hours/day Type of Home: House Home Access: Stairs to enter Entrance Stairs-Rails: Right;Left Entrance Stairs-Number of Steps: 2     Home Equipment: Rollator (4 wheels);Rolling Walker (2 wheels)      Prior Function Prior Level of Function : Independent/Modified Independent             Mobility Comments: got weak after a couple of days at home then needed help ADLs Comments: some help with sponge bath     Extremity/Trunk Assessment   Upper Extremity Assessment Upper Extremity Assessment: Generalized weakness    Lower Extremity Assessment Lower Extremity Assessment: Generalized weakness       Communication   Communication Communication: Impaired Factors Affecting Communication: Hearing impaired    Cognition Arousal: Alert Behavior During Therapy: Flat affect   PT - Cognitive impairments: Attention, Initiation, Problem solving                       PT - Cognition Comments: slow processing, sustained attention, delayed initiation Following commands: Impaired Following commands impaired: Only follows one step commands consistently, Follows one step commands with increased time     Cueing       General Comments General comments (skin integrity, edema, etc.): BP stable after donning thigh high TEDS and ace wraps in supine. see flowsheet    Exercises     Assessment/Plan    PT Assessment Patient needs continued PT services  PT Problem List Decreased strength;Decreased activity tolerance;Decreased balance;Decreased mobility;Decreased cognition;Decreased knowledge of use of DME;Cardiopulmonary status limiting activity       PT Treatment Interventions DME instruction;Gait training;Functional mobility training;Therapeutic activities;Therapeutic exercise;Balance training;Patient/family education    PT Goals  (Current goals can be found in the Care Plan section)  Acute Rehab PT Goals Patient Stated Goal: get back to rehab then home PT Goal Formulation: With patient/family Time For Goal Achievement: 07/17/24 Potential to Achieve Goals: Good    Frequency Min 2X/week     Co-evaluation               AM-PAC PT 6 Clicks Mobility  Outcome Measure Help needed turning from your back to your side while in a flat bed without using bedrails?: A Lot Help needed moving from lying on your back to sitting on the side of a flat bed without using bedrails?: Total Help needed moving to and from a bed to a chair (including a wheelchair)?: A Lot Help needed standing up from a chair using your arms (e.g., wheelchair or bedside chair)?: A Lot Help needed to walk in hospital room?: Total Help needed climbing 3-5 steps with a railing? : Total 6 Click Score: 9    End of Session Equipment Utilized During Treatment: Gait belt Activity Tolerance: Patient limited by fatigue;Treatment limited secondary to medical complications (Comment) (HR elevation 130's) Patient left: in bed;with family/visitor present;with call bell/phone within reach   PT Visit Diagnosis: Muscle weakness (generalized) (M62.81);Other abnormalities of gait and mobility (R26.89);Other symptoms and signs involving the  nervous system (R29.898)    Time: 8559-8477 PT Time Calculation (min) (ACUTE ONLY): 42 min   Charges:   PT Evaluation $PT Eval Moderate Complexity: 1 Mod PT Treatments $Therapeutic Activity: 23-37 mins PT General Charges $$ ACUTE PT VISIT: 1 Visit         Micheline Thompson, PT Acute Rehabilitation Services Office:[phone] 07/03/2024   Kristopher Thompson 07/03/2024, 5:20 PM

## 2024-07-03 NOTE — Progress Notes (Signed)
  Inpatient Rehab Admissions Coordinator :  Per therapy recommendations, patient was screened for CIR candidacy by Heron Leavell RN MSN.  At this time patient appears to be a potential candidate for CIR. Recent CIR admit 7/16 until 06/19/24. Noted need for follow up labs and possible need for IVF's as an outpatient discussed at discharge from CIR. I will place a rehab consult per protocol for full assessment. Please call me with any questions.  Heron Leavell RN MSN Admissions Coordinator [phone]

## 2024-07-03 NOTE — Progress Notes (Signed)
   07/03/24 2328  BiPAP/CPAP/SIPAP  Reason BIPAP/CPAP not in use Other(comment) (Patient does not tolerate per wife at bedside)  BiPAP/CPAP /SiPAP Vitals  Pulse Rate 70  Resp 16  Bilateral Breath Sounds Clear  MEWS Score/Color  MEWS Score 1  MEWS Score Color Green

## 2024-07-03 NOTE — Progress Notes (Signed)
 Dressing changed to right LQ abdomen where drain was removed, tan minimal drainage.

## 2024-07-03 NOTE — Plan of Care (Signed)

## 2024-07-04 DIAGNOSIS — A419 Sepsis, unspecified organism: Secondary | ICD-10-CM | POA: Diagnosis not present

## 2024-07-04 DIAGNOSIS — N179 Acute kidney failure, unspecified: Secondary | ICD-10-CM | POA: Diagnosis not present

## 2024-07-04 DIAGNOSIS — C679 Malignant neoplasm of bladder, unspecified: Secondary | ICD-10-CM | POA: Diagnosis not present

## 2024-07-04 DIAGNOSIS — N1330 Unspecified hydronephrosis: Secondary | ICD-10-CM | POA: Diagnosis not present

## 2024-07-04 LAB — COMPREHENSIVE METABOLIC PANEL WITH GFR
ALT: 17 U/L (ref 0–44)
AST: 16 U/L (ref 15–41)
Albumin: 2 g/dL — ABNORMAL LOW (ref 3.5–5.0)
Alkaline Phosphatase: 67 U/L (ref 38–126)
Anion gap: 8 (ref 5–15)
BUN: 70 mg/dL — ABNORMAL HIGH (ref 8–23)
CO2: 21 mmol/L — ABNORMAL LOW (ref 22–32)
Calcium: 9.3 mg/dL (ref 8.9–10.3)
Chloride: 103 mmol/L (ref 98–111)
Creatinine, Ser: 1.83 mg/dL — ABNORMAL HIGH (ref 0.61–1.24)
GFR, Estimated: 39 mL/min — ABNORMAL LOW (ref >60)
Glucose, Bld: 87 mg/dL (ref 70–99)
Potassium: 3.4 mmol/L — ABNORMAL LOW (ref 3.5–5.1)
Sodium: 132 mmol/L — ABNORMAL LOW (ref 135–145)
Total Bilirubin: 0.4 mg/dL (ref 0.0–1.2)
Total Protein: 5.1 g/dL — ABNORMAL LOW (ref 6.5–8.1)

## 2024-07-04 LAB — URINE CULTURE: Culture: >=100000 — AB

## 2024-07-04 LAB — CBC
HCT: 33.6 % — ABNORMAL LOW (ref 39.0–52.0)
Hemoglobin: 11.3 g/dL — ABNORMAL LOW (ref 13.0–17.0)
MCH: 27.9 pg (ref 26.0–34.0)
MCHC: 33.6 g/dL (ref 30.0–36.0)
MCV: 83 fL (ref 80.0–100.0)
Platelets: 189 K/uL (ref 150–400)
RBC: 4.05 MIL/uL — ABNORMAL LOW (ref 4.22–5.81)
RDW: 13.8 % (ref 11.5–15.5)
WBC: 7.5 K/uL (ref 4.0–10.5)
nRBC: 0 % (ref 0.0–0.2)

## 2024-07-04 LAB — MAGNESIUM: Magnesium: 2 mg/dL (ref 1.7–2.4)

## 2024-07-04 MED ORDER — LACTATED RINGERS IV BOLUS
1000.0000 mL | Freq: Once | INTRAVENOUS | Status: AC
  Administered 2024-07-04: 1000 mL via INTRAVENOUS

## 2024-07-04 MED ORDER — POTASSIUM CHLORIDE 20 MEQ PO PACK
40.0000 meq | PACK | Freq: Two times a day (BID) | ORAL | Status: AC
  Administered 2024-07-04 (×2): 40 meq via ORAL
  Filled 2024-07-04 (×2): qty 2

## 2024-07-04 NOTE — Progress Notes (Signed)
  Subjective: Doing well no N/V, no significant flank pain, bilateral neph tubes draining well   Objective: Vital signs in last 24 hours: Temp:  [97.6 F (36.4 C)-98.2 F (36.8 C)] 98.2 F (36.8 C) (08/23 0735) Pulse Rate:  [67-92] 74 (08/23 0735) Resp:  [16-20] 20 (08/23 0302) BP: (89-101)/(52-72) 89/52 (08/23 0735) SpO2:  [96 %-100 %] 97 % (08/23 0735)  Intake/Output from previous day: 08/22 0701 - 08/23 0700 In: 2253.7 [P.O.:720; I.V.:881.5; IV Piggyback:652.2] Out: 2125 [Urine:2025; Stool:100] Intake/Output this shift: No intake/output data recorded.  Physical Exam:  General: Alert and oriented CV: RRR Lungs: Clear GU: neph tubes in place draining clear urine, R side more murky than left.  Ext: NT, No erythema  Lab Results: Recent Labs    07/02/24 0316 07/03/24 0835 07/04/24 0304  HGB 13.7 12.5* 11.3*  HCT 39.6 38.0* 33.6*   BMET Recent Labs    07/03/24 0835 07/04/24 0304  NA 131* 132*  K 3.3* 3.4*  CL 99 103  CO2 18* 21*  GLUCOSE 84 87  BUN 89* 70*  CREATININE 2.12* 1.83*  CALCIUM  9.7 9.3     Studies/Results: No results found.  Assessment/Plan: 13 M complex hx including RP, cystectomy and Ileal conduit, most recently had parstomal hernia repair with injury to base of conduit managed with bilateral nephrotomy tubes. Admitted for UTI/AKI, and dehydration.   1) UTI/AKI/dehydration:  As a precaution, will leave bilateral PCNs to drainage over the weekend and until renal function has returned to baseline (Cr 1.3).  This will also allow optimal treatment of his MRSA UTI.  He does not appear to be obstructed on right side but did have evidence of at least partial to near complete obstruction of left poorly functional kidney.  Will leave left PCN to drainage for now and upon hospital discharge.  AKI likely multifactorial due to UTI, dehydration, and possibly a component of obstruction.  Now improving. - Cr today 1.83    2) Urine leak: Resolved. Abdominal  drain removed per Dr. Sheldon and I agree.  Culture obtained and will need to be covered with antibiotics appropriately based on that polymicrobial culture.  Will defer this therapy and duration to Dr. Sheldon. - Ucx + fro MRSA and Enterococcus  - on cefepime  and vanc    LOS: 3 days   Jackey Pea MD 07/04/2024, 9:24 AM [facility]

## 2024-07-04 NOTE — Progress Notes (Signed)
 Progress Note   Patient: Kristopher Thompson FMW:[id number] DOB: 09-15-1951 DOA: 07/01/2024  DOS: the patient was seen and examined on 07/04/2024   Brief hospital course:  Patient is a 73 year old with complicated history of HTN, CHFpEF, CAD, T2DM, PE on Eliquis , prostate cancer, bladder cancer who was admitted back in late May for incarcerated incisional hernia and urostomy ileal conduit revision.  Postoperative course was complicated by bowel perforation, sepsis, ICU admission with intubation, requiring pressors and CRRT.  Patient had bilateral nephrostomy tubes placed as well and had been in inpatient rehab until August 8 of this year.  At that time a study was done that supported capping the nephrostomy tubes.  Patient was sent home and was doing well including ambulating on his own, was tolerating p.o., had good urine output until approximately 3 days ago.  reports decreased urine output from about a liter a day to down to 300 in the last 24 hours.  Found to have aki and possible pyelonephritis.   Assessment and Plan:   Acute kidney injury - Creatinine 3.57 on presentation, markedly above baseline 1-1.3.  Multi factorial etiology most likely secondary to ongoing urinary obstruction.  Percutaneous nephrostomy tubes uncapped and IV fluids on board.  Showing improvement in urine output and renal function this morning, creatinine down to 1.83.  Will recheck BMP in AM.  IV fluids on board   Concern for urinary obstruction with L>R hydronephrosis - Nephrostomy tubes uncapped.  Evaluated by urology and following closely.  Urine output excellent bilaterally.  Right side slightly cloudy but otherwise improving.   Bladder cancer s/p radical cystectomy and ileal conduit - Initial surgery 07/2017, subsequent lower complicated course after with urine leak, ureteral anastomosis, parastomal hernia incarceration, bowel perforation with abscess, creation of end ileostomy, and hydronephrosis requiring bilateral  nephrostomy tubes.  Nephrostomy tubes initially capped as urine output was good.  However given AKI and decreased urine output, now uncapped.  Evaluation by urology, general surgery.  No concern for urine leak.  Previous abdominal wall drain removed by general surgery 8/21.   Concern for severe sepsis - Tachycardia, encephalopathy, attention, pyelonephritis on presentation concern for sepsis.  IV fluid bolus, blood cultures, empiric antibiotics on board.  Monitor blood pressure closely.  Etiology of infectious source include possible pyelonephritis as well as abdominal wall drain with some purulent drainage.  This was removed by general surgery 8/21, tip sent for culture.  Blood cultures NGTD.  Blood pressure still a bit soft, will order 1 more bolus again empirically.   Possible pyelonephritis - Leukocytosis, stranding noted on CT scan.  Initiated on empiric cefepime  and vanc.  Empiric urine cultures positive for MRSA as well as Enterococcus.   Obstructive sleep apnea - CPAP.   Chronic anticoagulation - Eliquis  on board   Subjective: Patient sitting up in bed this morning, appears improved from yesterday.  Wife at bedside.  Denies any fever, chills, chest pain, nausea, vomiting, abdominal pain.  Wife agrees that patient appears improved and was able to get some rest last night as well.  Physical Exam:  Vitals:   07/04/24 0302 07/04/24 0735 07/04/24 0800 07/04/24 1158  BP: (!) 91/55 (!) 89/52 (!) 85/68   Pulse: 72 74  80  Resp: 20     Temp: 98.1 F (36.7 C) 98.2 F (36.8 C)  98.2 F (36.8 C)  TempSrc: Oral Axillary    SpO2: 96% 97%  100%  Weight:      Height:  GENERAL:  Alert, pleasant, no acute distress HEENT:  EOMI CARDIOVASCULAR:  RRR, no murmurs appreciated RESPIRATORY:  Clear to auscultation, no wheezing, rales, or rhonchi GASTROINTESTINAL: BL nephrostomy tubes, soft, nontender, nondistended EXTREMITIES:  No LE edema bilaterally NEURO:  No new focal deficits  appreciated SKIN:  No rashes noted PSYCH:  Appropriate mood and affect    Data Reviewed:  Imaging Studies: CT ABDOMEN PELVIS WO CONTRAST Result Date: 07/02/2024 CLINICAL DATA:  Suspected sepsis EXAM: CT ABDOMEN AND PELVIS WITHOUT CONTRAST TECHNIQUE: Multidetector CT imaging of the abdomen and pelvis was performed following the standard protocol without IV contrast. RADIATION DOSE REDUCTION: This exam was performed according to the departmental dose-optimization program which includes automated exposure control, adjustment of the mA and/or kV according to patient size and/or use of iterative reconstruction technique. COMPARISON:  Chest x-ray 07/01/2024, CT 06/17/2024, 05/12/2024 FINDINGS: Lower chest: Lung bases demonstrates interval nodular airspace disease and ground-glass density in the left greater than right lung bases. No pleural effusion. Coronary vascular calcification. Hepatobiliary: No focal liver abnormality is seen. Status post cholecystectomy. No biliary dilatation. Pancreas: Chronic pancreatitis. No acute inflammation. Stable 10 mm low-density lesion at the distal pancreas, series 3, image 24. Spleen: Normal in size without focal abnormality. Adrenals/Urinary Tract: Adrenal glands are normal. Patient is status post cysto prostatectomy with right abdominal ileal conduit. Bilateral percutaneous nephrostomy catheters remain in place. Right-sided ureteral stent, extending from the renal pelvis to the right abdominal urostomy. Residual mild to moderate right-sided hydronephrosis but slightly diminished compared to most recent prior CT. Atrophic left kidney. Interval moderate severe left hydronephrosis and hydroureter, ureter dilated down to the ileal conduit, stricture was demonstrated on nephrostogram 05/11/2024. Small gas bubble in the left renal pelvis. Slight increased left perinephric stranding with mild stranding about the now dilated left ureter. Stomach/Bowel: Stomach is nondistended. No  dilated small bowel. Contrast within the colon. No acute bowel wall thickening. Right abdominal ileal conduit. Separate right abdominal ileostomy. 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 Vascular/Lymphatic: Aortic atherosclerosis. No aneurysm. No suspicious lymph nodes. Reproductive: Prostatectomy. Other: Negative for pelvic effusion. Fat containing right inguinal hernia. Drainage catheter in the subcutaneous soft tissues of the right abdominal wall near the urostomy. Decreased fluid collection within the right ventral pelvic wall, limited characterization without contrast. Sample measurement right laterally measures about 2.5 cm on series 3, image 66, previously 3.4 cm. Musculoskeletal: No acute osseous abnormality. Old superior endplate deformity at L1 IMPRESSION: 1. Interval development of moderate to severe left hydronephrosis and hydroureter, ureter dilated down to the ileal conduit, stricture was demonstrated on nephrostogram 05/11/2024. Slight increased left perinephric stranding with mild stranding about the now dilated left ureter, correlate for superimposed infection. Correlate also for function of the left percutaneous nephrostomy tube. 2. Bilateral percutaneous nephrostomy catheters remain in place. Right-sided ureteral stent, extending from the renal pelvis to the right abdominal urostomy. Residual mild to moderate right-sided hydronephrosis but slightly diminished compared to most recent prior CT. 3. Interval nodular airspace disease and ground-glass density in the left greater than right lung bases, suspect for pneumonia and or aspiration. 4. Drainage catheter in the subcutaneous soft tissues of the right abdominal wall near the urostomy. Decreased fluid collection within the right ventral pelvic wall, limited characterization without contrast. 5. Aortic atherosclerosis. Aortic Atherosclerosis (ICD10-I70.0). Electronically Signed   By: Luke Bun M.D.   On: 07/02/2024 01:57   DG  Chest Port 1 View if patient is in a treatment room. Result Date: 07/01/2024 CLINICAL DATA: Suspected sepsis EXAM:  PORTABLE CHEST 1 VIEW COMPARISON:  Chest radiograph June 12, 2024. FINDINGS: The heart size and mediastinal contours are within normal limits. Both lungs are clear. The visualized skeletal structures are unremarkable. IMPRESSION: No active disease. Electronically Signed   By: Megan  Zare M.D.   On: 07/01/2024 17:45   DG Loopogram Result Date: 06/17/2024 INDICATION: Patient with a history of a radical cystectomy/ileal conduit urinary diversion with a known leak of the ileal conduit with bilateral nephrostomy tubes placed 05/04/24 as well as placement of a right retrograde ureteral stent 05/14/24. Request for loopogram to assess for further evidence of urinary leak. COMPARISON:  DG LOOPOGRAM 05/12/24 CONTRAST:  A total of 60 mL Isovue -300 administered was administered into the bilateral nephrostomy tubes as well as retrograde ureteral stent. FLUOROSCOPY TIME:  21 mGy COMPLICATIONS: None immediate. TECHNIQUE: Informed written consent was obtained from the patient after a discussion of the risks, benefits and alternatives to treatment. Questions regarding the procedure were encouraged and answered. A timeout was performed prior to the initiation of the procedure. A pre procedural spot fluoroscopic image was obtained. The bilateral flank and external portions of existing nephrostomy catheters and right ureteral stent were prepped and sterilized. Antegrade injection of 20 mL of contrast into the existing right nephrostomy catheter demonstrated appropriate positioning within the renal pelvis. After several minutes, no contrast was identified within the right ureter or ileal conduit. Antegrade injection of 20 mL of contrast into the existing left nephrostomy catheter demonstrated appropriate positioning within the renal pelvis. After several minutes, a small volume of contrast was noted to flow through the left  ureter into the ileal conduit. Retrograde injection of the right ureteral stent opacified the right ureter and right renal collecting system. No contrast extravasation observed. Temporary dressing applied to the ileal conduit ostomy site. Bilateral nephrostomy tubes were capped as requested by ordering MD. The patient tolerated the procedure well without immediate postprocedural complication. FINDINGS: The existing right nephrostomy catheter is appropriately positioned and functioning. Right retrograde ureteral stent in place through ileal conduit. IMPRESSION: 1. Right nephrostogram via the right nephrostomy tube and right ureteral stent demonstrates normally patent ureter and ureteral anastomosis with the ileal conduit. No right-sided urine leak demonstrated. 2. Left nephrostogram through left nephrostomy tube appears in good position with evidence of contrast flow to the distal ureter. No left-sided urine leak demonstrated. Performed by: Kacie Matthews PA-C Electronically Signed   By: Ester Sides M.D.   On: 06/17/2024 16:00   CT ABDOMEN PELVIS W WO CONTRAST Result Date: 06/17/2024 CLINICAL DATA:  Abdominal pain, postop, ileal conduit leak. EXAM: CT ABDOMEN AND PELVIS WITHOUT AND WITH CONTRAST TECHNIQUE: Multidetector CT imaging of the abdomen and pelvis was performed following the standard protocol before and following the bolus administration of intravenous contrast. RADIATION DOSE REDUCTION: This exam was performed according to the departmental dose-optimization program which includes automated exposure control, adjustment of the mA and/or kV according to patient size and/or use of iterative reconstruction technique. CONTRAST:  75mL OMNIPAQUE  IOHEXOL  350 MG/ML SOLN COMPARISON:  05/12/2024. FINDINGS: Lower chest: A few tiny pulmonary nodules are unchanged from 08/29/2018 and are considered benign. Heart size normal. No pericardial effusion. No pleural effusion. Distal esophagus is grossly unremarkable.  Hepatobiliary: Liver is grossly unremarkable. Cholecystectomy. No biliary ductal dilatation. Pancreas: Chronic calcific pancreatitis. 1.3 cm low-attenuation lesion in the pancreatic tail (3/21), unchanged from 08/29/2018, compatible with a pseudocyst. Spleen: Negative. Adrenals/Urinary Tract: Negative. Stomach/Bowel: Bilateral percutaneous nephrostomies with contrast in the collecting systems bilaterally, secondary to a  loopogram performed prior to this study. Possible small stone in the right kidney (3/24). Persistent moderate to severe dilatation of the right intrarenal collecting system and right ureter with a double-J right ureteral stent in place, proximal loop in the right renal pelvis and distal portion the on the right lower quadrant ileal conduit. Left kidney is atrophic and scarred. Partial opacification of the left ureter which is decompressed. Ileal conduit in the right lower quadrant which may be stenotic as it traverses the abdominal wall, as on 05/12/2024. No extraluminal contrast. Cysto prostatectomy. Vascular/Lymphatic: Stomach is unremarkable. Right lower quadrant ileostomy and separate ileal conduit. Small bowel and colon are otherwise unremarkable. Appendix is not readily visualized. Reproductive: Prostatectomy. Other: Small right inguinal hernia contains fat. Enlarging broad fluid collection along the right ventral pelvic wall, just above the ileal conduit, measuring 3.4 x 21.6 cm. A percutaneous drain is seen within. No free fluid or extraluminal contrast. Musculoskeletal: Degenerative changes in the spine. Old L1 compression fracture. IMPRESSION: 1. 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 2. Enlarging fluid collection along the right ventral pelvic abdominal wall with a percutaneous drain in place, indicative of an abscess. 3. Possible ileal conduit stenosis, as it traverses the abdominal wall, with moderate to severe right hydronephrosis, as  before. 4. Right lower quadrant ileostomy. 5. Probable small right renal stone. 6. Chronic calcific pancreatitis. Electronically Signed   By: Newell Eke M.D.   On: 06/17/2024 15:47   DG Chest 2 View Result Date: 06/12/2024 CLINICAL DATA:  10031 Cough 10031 EXAM: CHEST - 2 VIEW COMPARISON:  Chest x-ray 05/26/2024, CT chest 04/14/2024 FINDINGS: The heart and mediastinal contours are within normal limits. No focal consolidation. No pulmonary edema. No pleural effusion. No pneumothorax. No acute osseous abnormality. IMPRESSION: No active cardiopulmonary disease. Electronically Signed   By: Morgane  Naveau M.D.   On: 06/12/2024 18:56    There are no new results to review at this time.  Previous records (including but not limited to H&P, progress notes, nursing notes, TOC management) were reviewed in assessment of this patient.  Labs: CBC: Recent Labs  Lab 07/01/24 1716 07/02/24 0316 07/03/24 0835 07/04/24 0304  WBC 16.9* 11.8* 8.0 7.5  NEUTROABS 13.7*  --   --   --   HGB 15.4 13.7 12.5* 11.3*  HCT 44.9 39.6 38.0* 33.6*  MCV 79.9* 80.7 83.2 83.0  PLT 330 243 186 189   Basic Metabolic Panel: Recent Labs  Lab 07/01/24 1716 07/02/24 0316 07/03/24 0835 07/04/24 0304  NA 120* 125* 131* 132*  K 4.1 3.9 3.3* 3.4*  CL 84* 90* 99 103  CO2 16* 15* 18* 21*  GLUCOSE 156* 109* 84 87  BUN 140* 123* 89* 70*  CREATININE 3.57* 3.18* 2.12* 1.83*  CALCIUM  11.1* 10.2 9.7 9.3  MG  --   --  2.3 2.0  PHOS  --  5.5*  --   --    Liver Function Tests: Recent Labs  Lab 07/01/24 1716 07/02/24 0316 07/04/24 0304  AST 25 22 16   ALT 34 27 17  ALKPHOS 123 89 67  BILITOT 1.1 0.7 0.4  PROT 7.5 6.0* 5.1*  ALBUMIN  3.7 2.8* 2.0*   CBG: No results for input(s): GLUCAP in the last 168 hours.  Scheduled Meds:  apixaban   5 mg Oral BID   Chlorhexidine  Gluconate Cloth  6 each Topical Daily   cyanocobalamin   500 mcg Oral Q M,W,F   cyproheptadine   4 mg Oral  QHS   feeding supplement  237 mL Oral  BID BM   fludrocortisone   0.2 mg Oral Daily   Gerhardt's butt cream  1 Application Topical BID   leptospermum manuka honey  1 Application Topical Daily   loperamide   4 mg Oral TID   melatonin  3 mg Oral QHS   metoCLOPramide   5 mg Oral TID AC   midodrine   15 mg Oral TID WC   multivitamin with minerals  1 tablet Oral Daily   nutrition supplement (JUVEN)  1 packet Oral BID BM   omega-3 acid ethyl esters  1 g Oral QODAY   mouth rinse  15 mL Mouth Rinse 4 times per day   potassium chloride   40 mEq Oral BID   psyllium  1 packet Oral Daily   rosuvastatin   10 mg Oral QPM   sertraline   100 mg Oral Daily   Continuous Infusions:  ceFEPime  (MAXIPIME ) IV 2 g (07/04/24 0304)   lactated ringers  75 mL/hr at 07/04/24 0900   vancomycin  200 mL/hr at 07/04/24 0900   PRN Meds:.acetaminophen , alum & mag hydroxide-simeth, cyclobenzaprine , fentaNYL  (SUBLIMAZE ) injection, hydrOXYzine , ipratropium, metoCLOPramide  (REGLAN ) injection, mouth rinse, oxyCODONE -acetaminophen , prochlorperazine , sodium chloride   Family Communication: Wife at bedside  Disposition: Status is: Inpatient Remains inpatient appropriate because: Severe sepsis     Time spent: 49 minutes  Length of inpatient stay: 3 days  Author: Carliss LELON Canales, DO 07/04/2024 12:15 PM  For on call review [email or web address].

## 2024-07-04 NOTE — Plan of Care (Signed)

## 2024-07-04 NOTE — PMR Pre-admission (Signed)
 PMR Admission Coordinator Pre-Admission Assessment  Patient: Kristopher Thompson is an 73 y.o., male MRN: [id number] DOB: [birth year] Height: 5' 11 (180.3 cm) Weight: 89 kg  Insurance Information HMO:     PPO:      PCP:      IPA:      80/20: yes     OTHER:  PRIMARY: Medicare Part A and B      Policy#: [id number]      Subscriber: patient   Benefits:  Phone #: verified eligibility via [employer] on 07/04/24     Name:  Eff. Date: Part A and B effective 05/12/17    Deduct: $1,676      Out of Pocket Max: NA      Life Max: NA CIR: 100% coverage      SNF: 100% coverage for days 1-20, 80% coverage for days 21-100 Outpatient: 80% coverage     Co-Pay: 20% Home Health: 100% coverage      Co-Pay:  DME: 80% coverage     Co-Pay: 20% Providers: pt's choice SECONDARY: [employer]      Policy#: [id number]     Phone#: [phone]  Financial Counselor:       Phone#:   The "Data Collection Information Summary" for patients in Inpatient Rehabilitation Facilities with attached "Privacy Act Statement-Health Care Records" was provided and verbally reviewed with: Patient and Family  Emergency Contact Information Contact Information     Name Relation Home Work Mobile   Agudelo,Debbie Spouse [phone]  [phone]      Other Contacts   None on File    Current Medical History  Patient Admitting Diagnosis: MRSA UTI  History of Present Illness: Pt is a 73 year old male with medical hx  significant for: HTN, CHFpEF, CAD, DM II, PE on Eliquis , prostate CA, bladder CA. Pt presented to [facility] on 07/01/24 d/t fatigue, nausea. Also had decreased urine output. Pt hospitalized in May for left inguinal incarcerated hernia repair with mesh and urostomy ileal conduit revision. Pt then developed perforated bowel and intra-abdominal abscess with small bowel resection and wound VAC placement. Pt had repeat surgeries on 6/10 with abdominal wall debridement, ileal resection, end ileostomy, abdominal wall hernia  repairs and wound VAC. Also in June pt required dialysis. Nephrostomy tubes exchanged on 6/30. On 7/2, pt had right percutaneous nephrostomy exchange with ureteral stent. Discharged on 7/11. Pt readmitted on 7/14 with AKI. Pt admitted to CIR from 05/27/24-06/19/24.   In ED, pt's white count was 16.9 and creatinine was 3.57. Started on sepsis protocol.Chest x-ray negative. Nephrostomy tubes were uncapped and nephrostomy bags replaced. Urology, Surgery and Nephrology consulted. CT abdomen showed left-sided hydronephrosis and hydroureter associated with perinephric which is concerning for acute pyelonephritis. CT chest revealed ground glass opacity of right lung base which is concerning for PNA versus aspiration. RLQ abdominal drain removed on 8/21. On 8/28 exchange of bilateral nephrostomy catheters per IR. R nephrostomy tube capped 8/29. Pt. Seen by PT/OT/SLP and they recommend CIR to assist return to PLOF.   Patient's medical record from [facility] has been reviewed by the rehabilitation admission coordinator and physician.  Past Medical History  Past Medical History:  Diagnosis Date   At risk for sleep apnea    12-25-2017   STOP-BANG SCORE= 5   --- SENT TO PCP   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   moderate atypia - right low back   Atypical nevus 04/04/2007   moderate to marked - right upper back (wider shave)  Atypical nevus 04/04/2007   moderate atypia - center chest (wider shave)   Atypical nevus 04/04/2007   slight atypia - right thigh   Atypical nevus 11/29/2011   mild atypia - center upper back   Atypical nevus 11/29/2011   mild atypia - center chest   Bacteremia due to Klebsiella pneumoniae 10/09/2017   Bladder cancer (HCC) dx 07/2017   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 denies   Sleep apnea    cpap   Squamous cell carcinoma of skin 05/22/2013   left cheek - CX3 + 5FU   Wears glasses    Has the patient had major surgery during 100 days prior to admission? Yes  Family History   family history includes Colon cancer in an other family member; Hypertension in his father; Lung cancer in his mother.  Current Medications  Current Facility-Administered Medications:    acetaminophen  (TYLENOL ) tablet 325-650 mg, 325-650 mg, Oral, Q4H PRN, Pratt, Tanya S, MD, 650 mg at 07/03/24 0125   alum & mag hydroxide-simeth (MAALOX/MYLANTA) 200-200-20 MG/5ML suspension 15 mL, 15 mL, Oral, Q4H PRN, Fredirick Glenys RAMAN, MD   apixaban  (ELIQUIS ) tablet 5 mg, 5 mg, Oral, BID, Pratt, Tanya S, MD, 5 mg at 07/08/24 2150   cefTRIAXone  (ROCEPHIN ) 2 g in sodium chloride  0.9 % 100 mL IVPB, 2 g, Intravenous, Q24H, Vu, Trung T, MD, Last Rate: 200 mL/hr at 07/09/24 0004, 2 g at 07/09/24 0004   Chlorhexidine  Gluconate Cloth 2 % PADS 6 each, 6 each, Topical, Daily, Arlon Carliss ORN, DO, 6 each at 07/08/24 1026   cyanocobalamin  (VITAMIN B12) tablet 500 mcg, 500 mcg, Oral, Q M,W,F, Fredirick Glenys RAMAN, MD, 500 mcg at 07/08/24 1022   cyclobenzaprine  (FLEXERIL ) tablet 5 mg, 5 mg, Oral, TID PRN, Fredirick Glenys RAMAN, MD   cyproheptadine  (PERIACTIN ) 4 MG tablet 4 mg, 4 mg, Oral, QHS, Fredirick Glenys RAMAN, MD, 4 mg at 07/08/24 2150   feeding supplement (ENSURE PLUS HIGH PROTEIN) liquid 237 mL, 237 mL, Oral, BID BM, Fredirick Glenys RAMAN, MD   fludrocortisone  (FLORINEF ) tablet 0.2 mg, 0.2 mg, Oral, Daily, Fredirick Glenys RAMAN, MD, 0.2 mg at 07/08/24 1022   Gerhardt's butt cream 1 Application, 1  Application, Topical, BID, Fredirick Glenys RAMAN, MD, 1 Application at 07/08/24 2151   hydrOXYzine  (ATARAX ) tablet 25 mg, 25 mg, Oral, TID PRN, Fredirick Glenys RAMAN, MD   iohexol  (OMNIPAQUE ) 300 MG/ML solution 50 mL, 50 mL, Per Tube, Once PRN, Jenna Cordella LABOR, MD   ipratropium (ATROVENT ) 0.03 % nasal spray 1 spray, 1 spray, Each Nare, Q8H PRN, Fredirick, Glenys RAMAN, MD   leptospermum manuka honey (MEDIHONEY) paste 1 Application, 1 Application, Topical, Daily, Fredirick Glenys RAMAN, MD, 1 Application at 07/08/24 1032   loperamide  (IMODIUM ) capsule 4 mg, 4 mg, Oral, TID, [name], MD, 4 mg at 07/08/24 2150   melatonin tablet 3 mg, 3 mg,  Oral, QHS, Pratt, Tanya S, MD, 3 mg at 07/08/24 2150   metoCLOPramide  (REGLAN ) injection 10 mg, 10 mg, Intravenous, Q6H PRN, Fredirick Glenys RAMAN, MD   metoCLOPramide  (REGLAN ) tablet 5 mg, 5 mg, Oral, TID AC, Pratt, Tanya S, MD, 5 mg at 07/08/24 1641   midodrine  (PROAMATINE ) tablet 15 mg, 15 mg, Oral, TID WC, Pratt, Tanya S, MD, 15 mg at 07/08/24 1642   multivitamin with minerals tablet 1 tablet, 1 tablet, Oral, Daily, Fredirick Glenys RAMAN, MD, 1 tablet at 07/07/24 9075   nutrition supplement (JUVEN) (JUVEN) powder packet 1 packet, 1 packet, Oral, BID BM, Fredirick Glenys RAMAN, MD, 1 packet at 07/07/24 1431   omega-3 acid ethyl esters (LOVAZA ) capsule 1 g, 1 g, Oral, QODAY, Pratt, Tanya S, MD, 1 g at 07/07/24 9076   Oral care mouth rinse, 15 mL, Mouth Rinse, 4 times per day, Arlon, Derek W, DO, 15 mL at 07/08/24 2151   Oral care mouth rinse, 15 mL, Mouth Rinse, PRN, Arlon, Derek W, DO   oxyCODONE -acetaminophen  (PERCOCET/ROXICET) 5-325 MG per tablet 2 tablet, 2 tablet, Oral, Q6H PRN, Fredirick Glenys RAMAN, MD, 2 tablet at 07/06/24 1556   prochlorperazine  (COMPAZINE ) tablet 5 mg, 5 mg, Oral, Q6H PRN, Fredirick Glenys RAMAN, MD   psyllium (HYDROCIL/METAMUCIL) 1 packet, 1 packet, Oral, Daily, Fredirick Glenys RAMAN, MD, 1 packet at 07/06/24 9071   rosuvastatin  (CRESTOR ) tablet 10 mg, 10 mg, Oral, QPM, Pratt, Tanya S, MD, 10 mg  at 07/08/24 1816   sertraline  (ZOLOFT ) tablet 100 mg, 100 mg, Oral, Daily, Pratt, Tanya S, MD, 100 mg at 07/08/24 1023   sodium chloride  (OCEAN) 0.65 % nasal spray 1-2 spray, 1-2 spray, Each Nare, Q6H PRN, Fredirick Glenys RAMAN, MD   sodium chloride  flush (NS) 0.9 % injection 5 mL, 5 mL, Intracatheter, Q8H, Jenna Cordella LABOR, MD   vancomycin  variable dose per unstable renal function (pharmacist dosing), , Does not apply, See admin instructions, Gretel Prentice BIRCH, [facility]  Patients Current Diet:  Diet Order             Diet regular Room service appropriate? Yes; Fluid consistency: Thin  Diet effective now                  Precautions / Restrictions Precautions Precautions: Fall, Other (comment) Precaution/Restrictions Comments: urostomy, colostomy, lt PCN drain, watch BP and HR Restrictions Weight Bearing Restrictions Per Provider Order: No   Has the patient had 2 or more falls or a fall with injury in the past year? No  Prior Activity Level Limited Community (1-2x/wk): doctor's appointments  Prior Functional Level Self Care: Did the patient need help bathing, dressing, using the toilet or eating? Needed some help  Indoor Mobility: Did the patient need assistance with walking from room to room (with or without device)? Independent  Stairs: Did the patient need assistance with internal or external stairs (with or without device)? Independent  Functional Cognition: Did the patient need help planning regular tasks such as shopping or remembering to take medications? Needed some help  Patient Information Are you of Hispanic, Latino/a,or Spanish origin?: A. No, not of Hispanic, Latino/a, or Spanish origin What is your race?: A. White Do you need or want an interpreter to communicate with a doctor or health care staff?: 0. No  Patient's Response To:  Health Literacy and Transportation Is the patient able to respond to health literacy and transportation needs?: Yes Health Literacy - How  often do you need to have someone help you  when you read instructions, pamphlets, or other written material from your doctor or pharmacy?: Never In the past 12 months, has lack of transportation kept you from medical appointments or from getting medications?: No In the past 12 months, has lack of transportation kept you from meetings, work, or from getting things needed for daily living?: No  Home Assistive Devices / Equipment Home Equipment: Rollator (4 wheels), Agricultural consultant (2 wheels)  Prior Device Use: Indicate devices/aids used by the patient prior to current illness, exacerbation or injury? Manual wheelchair and Walker  Current Functional Level Cognition  Orientation Level: Oriented X4    Extremity Assessment (includes Sensation/Coordination)  Upper Extremity Assessment: Generalized weakness  Lower Extremity Assessment: Generalized weakness    ADLs  Overall ADL's : Needs assistance/impaired Eating/Feeding: Independent, Sitting Grooming: Sitting, Contact guard assist, Wash/dry face Upper Body Bathing: Sitting, Contact guard assist Lower Body Bathing: Moderate assistance, Sitting/lateral leans Upper Body Dressing : Sitting, Minimal assistance Lower Body Dressing: Total assistance, Bed level Lower Body Dressing Details (indicate cue type and reason): don socks, TED hoses, wrapped BLEs. Toilet Transfer: Rolling walker (2 wheels), Stand-pivot, Maximal assistance Toilet Transfer Details (indicate cue type and reason): simulated with recliner t.f Toileting- Clothing Manipulation and Hygiene: Maximal assistance, +2 for physical assistance, +2 for safety/equipment, Sit to/from stand Functional mobility during ADLs: Maximal assistance (rec +2 for safety) General ADL Comments: not able to progress to standing ADLs using stedy. HR too high once in perched position.    Mobility  Overal bed mobility: Needs Assistance Bed Mobility: Rolling, Sidelying to Sit Rolling: Max assist, Used  rails Sidelying to sit: Mod assist, +2 for physical assistance Supine to sit: Mod assist, HOB elevated, Used rails Sit to supine: Mod assist, Used rails General bed mobility comments: max assist to roll right this session after ted hose donned with mod assist to clear legs off bed and elevate trunk to sitting. mod assist to scoot to EOB    Transfers  Overall transfer level: Needs assistance Equipment used: Rolling walker (2 wheels) Transfers: Sit to/from Stand Sit to Stand: Mod assist, +2 physical assistance Bed to/from chair/wheelchair/BSC transfer type:: Stand pivot Stand pivot transfers: Mod assist, +2 safety/equipment Step pivot transfers: Mod assist, +2 physical assistance Transfer via Lift Equipment: Stedy General transfer comment: mod +2 assist to rise from bed with axillary assist and cues for anterior weight shift and hip extension. Pt able to step with shuffling feet bed to chair with use of RW and mod assist +2 for safety. repeated stand from chair with mod assist to scoot to edge and min +2 to stand from surface, unable to march in standing and stood grossly 15 sec    Ambulation / Gait / Stairs / Wheelchair Mobility  Ambulation/Gait Ambulation/Gait assistance: Mod assist Gait Distance (Feet): 2 Feet Assistive device: Rolling walker (2 wheels) Gait Pattern/deviations: Step-to pattern General Gait Details: not yet able    Posture / Balance Dynamic Sitting Balance Sitting balance - Comments: CGA Balance Overall balance assessment: Needs assistance Sitting-balance support: Feet supported, No upper extremity supported Sitting balance-Leahy Scale: Fair Sitting balance - Comments: CGA Postural control: Right lateral lean Standing balance support: Bilateral upper extremity supported, Reliant on assistive device for balance, During functional activity Standing balance-Leahy Scale: Poor Standing balance comment: Rw and assist in standing    Special considerations/life events   Active with [facility] for HH RN, PR and IV antibiotics arranged with Holley Herring with [facility]. On Ceftriaxone  and Vanc IV at home with end  date of 08/13/24   Previous Home Environment  Living Arrangements: Spouse/significant other  Lives With: Spouse Available Help at Discharge: Family, Available 24 hours/day Type of Home: House Home Layout: Two level, Able to live on main level with bedroom/bathroom Home Access: Stairs to enter Entrance Stairs-Rails: Can reach both Entrance Stairs-Number of Steps: 2 Bathroom Shower/Tub: Associate Professor: Yes How Accessible: Accessible via walker Home Care Services: No (was scheduled to have first session the day of hospital admission)  Discharge Living Setting Plans for Discharge Living Setting: Patient's home Type of Home at Discharge: House Discharge Home Layout: Able to live on main level with bedroom/bathroom Discharge Home Access: Stairs to enter Entrance Stairs-Rails: Can reach both Entrance Stairs-Number of Steps: 2 Discharge Bathroom Shower/Tub: Tub/shower unit Discharge Bathroom Toilet: Standard Discharge Bathroom Accessibility: Yes How Accessible: Accessible via walker Does the patient have any problems obtaining your medications?: No  Social/Family/Support Systems Anticipated Caregiver: DebbieLavinder, wife Anticipated Caregiver's Contact Information: [phone] Caregiver Availability: 24/7 Discharge Plan Discussed with Primary Caregiver: Yes Is Caregiver In Agreement with Plan?: Yes Does Caregiver/Family have Issues with Lodging/Transportation while Pt is in Rehab?: No  Goals Patient/Family Goal for Rehab: supervision PT, OT and SLP Expected length of stay: ELOS 10 to 14 days Pt/Family Agrees to Admission and willing to participate: Yes Program Orientation Provided & Reviewed with Pt/Caregiver Including Roles  & Responsibilities: Yes  Decrease burden of Care through IP  rehab admission: NA  Possible need for SNF placement upon discharge: Not anticipated  Patient Condition: I have reviewed medical records from [facility], spoken with CM, and patient and spouse. I met with patient at the bedside for inpatient rehabilitation assessment.  Patient will benefit from ongoing PT and OT, can actively participate in 3 hours of therapy a day 5 days of the week, and can make measurable gains during the admission.  Patient will also benefit from the coordinated team approach during an Inpatient Acute Rehabilitation admission.  The patient will receive intensive therapy as well as Rehabilitation physician, nursing, social worker, and care management interventions.  Due to bladder management, bowel management, safety, skin/wound care, disease management, medication administration, pain management, and patient education the patient requires 24 hour a day rehabilitation nursing.  The patient is currently Mod A+2 with mobility and basic ADLs.  Discharge setting and therapy post discharge at home with home health is anticipated.  Patient has agreed to participate in the Acute Inpatient Rehabilitation Program and will admit tomorrow.  Preadmission Screen Completed By:  Tinnie SHAUNNA Yvone Delayne, 07/09/2024 1:19 PM ______________________________________________________________________   Discussed status with Dr. Carilyn on 07/10/24 at 900 and received approval for admission tomorrow.  Admission Coordinator:  Tinnie SHAUNNA Yvone Delayne, CCC-SLP, time 1040 Date 07/10/24 with updates by [name] Kleine, MS, CCC-SLP   Assessment/Plan: Diagnosis: Debility due to sepsis Does the need for close, 24 hr/day Medical supervision in concert with the patient's rehab needs make it unreasonable for this patient to be served in a less intensive setting? Yes Co-Morbidities requiring supervision/potential complications: Ileostomy, urostomy , nephrostomy, open abd wound healing by secondary intention Due  to bladder management, bowel management, safety, skin/wound care, disease management, medication administration, pain management, and patient education, does the patient require 24 hr/day rehab nursing? Yes Does the patient require coordinated care of a physician, rehab nurse, PT, OT, and SLP to address physical and functional deficits in the context of the above medical diagnosis(es)? Yes Addressing deficits in the following areas: balance, endurance,  locomotion, strength, transferring, bathing, dressing, grooming, toileting, and psychosocial support Can the patient actively participate in an intensive therapy program of at least 3 hrs of therapy 5 days a week? Yes The potential for patient to make measurable gains while on inpatient rehab is good and fair Anticipated functional outcomes upon discharge from inpatient rehab: supervision PT, supervision and min assist OT, modified independent SLP Estimated rehab length of stay to reach the above functional goals is: 10-14d Anticipated discharge destination: Home 10. Overall Rehab/Functional Prognosis: fair   MD Signature: Prentice CHARLENA Compton M.D. [facility] Fellow Am Acad of Phys Med and Rehab Diplomate Am Board of Electrodiagnostic Med Fellow Am Board of Interventional Pain

## 2024-07-04 NOTE — Progress Notes (Signed)
 Inpatient Rehab Admissions:  Inpatient Rehab Consult received.  I met with patient and his wife Marval at the bedside for rehabilitation assessment and to discuss goals and expectations of an inpatient rehab admission.  Discussed average length of stay and discharge home after completion of CIR. Pt interested in pursuing CIR and wife supportive. Debbie confirmed that family will be able to provide support for pt after discharge. Will continue to follow.  Signed: Tinnie Yvone Cohens, MS, CCC-SLP Admissions Coordinator [phone]

## 2024-07-04 NOTE — Progress Notes (Signed)
 Subjective No acute events. Awake, alert, wife at bedside. Denies any concerns/complaints at present  Objective: Vital signs in last 24 hours: Temp:  [97.6 F (36.4 C)-98.2 F (36.8 C)] 98.2 F (36.8 C) (08/23 0735) Pulse Rate:  [67-92] 74 (08/23 0735) Resp:  [16-20] 20 (08/23 0302) BP: (89-101)/(52-72) 89/52 (08/23 0735) SpO2:  [96 %-100 %] 97 % (08/23 0735) Last BM Date : 07/03/24  Intake/Output from previous day: 08/22 0701 - 08/23 0700 In: 2253.7 [P.O.:720; I.V.:881.5; IV Piggyback:652.2] Out: 2125 [Urine:2025; Stool:100] Intake/Output this shift: No intake/output data recorded.  Gen: NAD, comfortable CV: RRR Pulm: Normal work of breathing Abd: Soft, NT/ND; ostomy productive Ext: SCDs in place  Lab Results: CBC  Recent Labs    07/03/24 0835 07/04/24 0304  WBC 8.0 7.5  HGB 12.5* 11.3*  HCT 38.0* 33.6*  PLT 186 189   BMET Recent Labs    07/03/24 0835 07/04/24 0304  NA 131* 132*  K 3.3* 3.4*  CL 99 103  CO2 18* 21*  GLUCOSE 84 87  BUN 89* 70*  CREATININE 2.12* 1.83*  CALCIUM  9.7 9.3   PT/INR Recent Labs    07/01/24 1716  LABPROT 22.5*  INR 1.9*   ABG No results for input(s): PHART, HCO3 in the last 72 hours.  Invalid input(s): PCO2, PO2  Studies/Results:  Anti-infectives: Anti-infectives (From admission, onward)    Start     Dose/Rate Route Frequency Ordered Stop   07/03/24 1600  vancomycin  (VANCOCIN ) IVPB 1000 mg/200 mL premix        1,000 mg 200 mL/hr over 60 Minutes Intravenous Every 24 hours 07/03/24 1422     07/03/24 1600  ceFEPIme  (MAXIPIME ) 2 g in sodium chloride  0.9 % 100 mL IVPB        2 g 200 mL/hr over 30 Minutes Intravenous Every 12 hours 07/03/24 1423     07/03/24 1345  linezolid  (ZYVOX ) IVPB 600 mg  Status:  Discontinued        600 mg 300 mL/hr over 60 Minutes Intravenous Every 12 hours 07/03/24 1252 07/03/24 1306   07/02/24 1800  ceFEPIme  (MAXIPIME ) 2 g in sodium chloride  0.9 % 100 mL IVPB  Status:  Discontinued         2 g 200 mL/hr over 30 Minutes Intravenous Every 24 hours 07/02/24 0323 07/03/24 1423   07/02/24 0345  vancomycin  (VANCOREADY) IVPB 2000 mg/400 mL        2,000 mg 200 mL/hr over 120 Minutes Intravenous  Once 07/02/24 0324 07/02/24 0600   07/01/24 2030  ceFEPIme  (MAXIPIME ) 2 g in sodium chloride  0.9 % 100 mL IVPB        2 g 200 mL/hr over 30 Minutes Intravenous  Once 07/01/24 2020 07/01/24 2308        Assessment/Plan: Patient Active Problem List   Diagnosis Date Noted   Hyponatremia 07/02/2024   Adjustment disorder with depressed mood 06/01/2024   Debility 05/27/2024   High output ileostomy (HCC) 05/26/2024   CKD stage 3b, GFR 30-44 ml/min (HCC) 05/26/2024   Ileostomy in place [facility]) 05/20/2024   H/O insertion of nephrostomy tube 05/20/2024   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 05/11/2024   Delayed bowel perforation s/p SBR/end ileostomy 04/29/2024   Pressure injury of skin 04/29/2024   Sinus tachycardia 04/13/2024   Tachypnea 04/13/2024   Acute respiratory insufficiency, postoperative 04/13/2024   Sepsis due to undetermined organism (HCC) 04/13/2024   Lactic acidosis 04/13/2024   Class 2 obesity 04/13/2024  Incarcerated incisional hernia 04/10/2024   Chronic anticoagulation 03/02/2024   Personal history of PE (pulmonary embolism) 03/02/2024   Irritant contact dermatitis associated with fecal stoma 05/28/2023   Non-recurrent bilateral inguinal hernia without obstruction or gangrene 05/28/2023   Obstructive sleep apnea of adult 11/21/2022   Post-traumatic arthritis of ankle, 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 Appears well controlled and appropriately thickened on Imodium  4 mg TID; psyllium. Noted he is also on reglan  however. - Cr down-trending, 1.83 today from 2.1<--3.2; monitor - Cont abx; long-term plans as per Dr. Sheldon - PPX: SCDs; on eliquis    LOS: 3 days   Kristopher Pizza, MD [facility]

## 2024-07-04 NOTE — Evaluation (Signed)
 Occupational Therapy Evaluation Patient Details Name: Kristopher Thompson MRN: [id number] DOB: 01/24/1951 Today's Date: 07/04/2024   History of Present Illness   Pt is a 73 y/o male admitted 8/20 from home with new onset of weakness, muffled voice and nausea after 3 days at home post recent hospital and inpatient rehab stay for bowel perforation /sepsis.  PMHx: HTN, chronic diastolic congestive heart failure, CAD, DM2, pulmonary embolism on Eliquis , anemia, obesity, prostate cancer, bladder cancer, recent admission 7/30-7/1 for left inguinal incarcerated hernia s/p robotic hernia repair, urostomy 2/ ileal conduit creation revision, bilat percutaneneous nephrostomy tubes 05/04/24.     Clinical Impressions Pt admitted for above, PTA pt was home living with spouse and able to bathe himself but needed help dressing, was able to ambulate mod I with RW at home. Pt currently presenting as generally weak with decreased activity tolerance and balance. Pt with significant trouble offloading BLEs needing max A ext assist from OT to weight shift and transfer. Pt needing total A to CGA for ADLs at this time, benefits from having TED hoses and BLEs wrapped prior to mobility given his low BP. OT to continue following pt acutely to progress as able. Patient has the potential to reach Mod I and demos the ability to tolerate 3 hours of therapy. Pt would benefit from an intensive rehab program to help maximize functional independence.      If plan is discharge home, recommend the following:   Two people to help with walking and/or transfers;A lot of help with bathing/dressing/bathroom;Two people to help with bathing/dressing/bathroom;Assist for transportation     Functional Status Assessment   Patient has had a recent decline in their functional status and demonstrates the ability to make significant improvements in function in a reasonable and predictable amount of time.     Equipment Recommendations    None recommended by OT (defer to next level of care)     Recommendations for Other Services   Rehab consult     Precautions/Restrictions   Precautions Precautions: Fall Recall of Precautions/Restrictions: Impaired Precaution/Restrictions Comments: ileal conduit, ileostomy, B PCN tubes Restrictions Weight Bearing Restrictions Per Provider Order: No     Mobility Bed Mobility Overal bed mobility: Needs Assistance Bed Mobility: Rolling, Sidelying to Sit Rolling: Min assist, Used rails Sidelying to sit: Mod assist, Used rails, HOB elevated       General bed mobility comments: cues for pt to use bed rail and assis wtih rolling. mod A to raise trunk into midline. initial R lean mod A to prevent from LOB.    Transfers Overall transfer level: Needs assistance Equipment used: Rolling walker (2 wheels) Transfers: Sit to/from Stand, Bed to chair/wheelchair/BSC Sit to Stand: Mod assist Stand pivot transfers: Max assist         General transfer comment: Max A for stand pivot to recliner via bear hug method, OT assisting pt with weight shifting and Pt able to perform small offloads.      Balance Overall balance assessment: Needs assistance Sitting-balance support: Feet supported, No upper extremity supported Sitting balance-Leahy Scale: Poor Sitting balance - Comments: initial R lean, progressed to min A and intermittent CGA Postural control: Right lateral lean Standing balance support: Bilateral upper extremity supported Standing balance-Leahy Scale: Poor Standing balance comment: reliant on ext support.                           ADL either performed or assessed with clinical judgement  ADL Overall ADL's : Needs assistance/impaired Eating/Feeding: Independent;Sitting   Grooming: Sitting;Contact guard assist   Upper Body Bathing: Sitting;Contact guard assist   Lower Body Bathing: Moderate assistance;Sitting/lateral leans   Upper Body Dressing :  Sitting;Minimal assistance   Lower Body Dressing: Total assistance;Bed level Lower Body Dressing Details (indicate cue type and reason): don socks, TED hoses, wrapped BLEs. Toilet Transfer: Rolling walker (2 wheels);Stand-pivot;Maximal assistance Toilet Transfer Details (indicate cue type and reason): simulated with recliner t.f Toileting- Clothing Manipulation and Hygiene: Maximal assistance;+2 for physical assistance;+2 for safety/equipment;Sit to/from stand       Functional mobility during ADLs: Maximal assistance (rec +2 for safety)       Vision Baseline Vision/History: 1 Wears glasses Ability to See in Adequate Light: 0 Adequate Vision Assessment?: No apparent visual deficits     Perception Perception: Not tested       Praxis Praxis: WFL       Pertinent Vitals/Pain Pain Assessment Pain Assessment: No/denies pain     Extremity/Trunk Assessment Upper Extremity Assessment Upper Extremity Assessment: Generalized weakness   Lower Extremity Assessment Lower Extremity Assessment: Generalized weakness       Communication Communication Communication: Impaired Factors Affecting Communication: Hearing impaired   Cognition Arousal: Alert Behavior During Therapy: Flat affect Cognition: No apparent impairments, Difficult to assess Difficult to assess due to: Hard of hearing/deaf                               Following commands impaired: Follows one step commands with increased time     Cueing  General Comments   Cueing Techniques: Verbal cues  TED hoses and ACE wraps donned in supine. BP was not notable difference with transfer from sitting position (90s/60s)   Exercises     Shoulder Instructions      Home Living Family/patient expects to be discharged to:: Private residence Living Arrangements: Spouse/significant other Available Help at Discharge: Family;Available 24 hours/day Type of Home: House Home Access: Stairs to enter [employer] of Steps: 2 Entrance Stairs-Rails: Right;Left Home Layout: Two level;Able to live on main level with bedroom/bathroom     Bathroom Shower/Tub: Chief Strategy Officer: Standard     Home Equipment: Rollator (4 wheels);Rolling Environmental consultant (2 wheels)      Lives With: Spouse    Prior Functioning/Environment Prior Level of Function : Independent/Modified Independent             Mobility Comments: mod i RW, got weak after a couple of days at home then needed help ADLs Comments: wife reports pt was bathing himself, had assist wtih dressing.    OT Problem List: Decreased strength;Decreased activity tolerance;Impaired balance (sitting and/or standing)   OT Treatment/Interventions: Self-care/ADL training;Therapeutic exercise;Patient/family education;Balance training;Therapeutic activities;DME and/or AE instruction      OT Goals(Current goals can be found in the care plan section)   Acute Rehab OT Goals Patient Stated Goal: to get better OT Goal Formulation: With patient Time For Goal Achievement: 07/18/24 Potential to Achieve Goals: Good ADL Goals Pt Will Perform Grooming: sitting;with set-up;with supervision Pt Will Transfer to Toilet: bedside commode;stand pivot transfer;with contact guard assist Pt Will Perform Toileting - Clothing Manipulation and hygiene: with contact guard assist;sit to/from stand Pt/caregiver will Perform Home Exercise Program: Increased strength;Both right and left upper extremity;With theraband;With written HEP provided;Independently   OT Frequency:  Min 2X/week    Co-evaluation  AM-PAC OT 6 Clicks Daily Activity     Outcome Measure Help from another person eating meals?: None Help from another person taking care of personal grooming?: A Little Help from another person toileting, which includes using toliet, bedpan, or urinal?: A Lot Help from another person bathing (including washing, rinsing, drying)?: A  Lot Help from another person to put on and taking off regular upper body clothing?: A Little Help from another person to put on and taking off regular lower body clothing?: Total 6 Click Score: 15   End of Session Equipment Utilized During Treatment: Gait belt;Rolling walker (2 wheels) Nurse Communication: Mobility status  Activity Tolerance: Patient tolerated treatment well Patient left: in chair;with call bell/phone within reach;with chair alarm set  OT Visit Diagnosis: Unsteadiness on feet (R26.81);Other abnormalities of gait and mobility (R26.89);Muscle weakness (generalized) (M62.81)                Time: 9041-8962 OT Time Calculation (min): 39 min Charges:  OT General Charges $OT Visit: 1 Visit OT Evaluation $OT Eval Moderate Complexity: 1 Mod OT Treatments $Therapeutic Activity: 23-37 mins  07/04/2024  AB, OTR/L  Acute Rehabilitation Services  Office: [phone]   Curtistine JONETTA Das 07/04/2024, 1:56 PM

## 2024-07-05 DIAGNOSIS — N1330 Unspecified hydronephrosis: Secondary | ICD-10-CM | POA: Diagnosis not present

## 2024-07-05 DIAGNOSIS — N9989 Other postprocedural complications and disorders of genitourinary system: Secondary | ICD-10-CM | POA: Diagnosis not present

## 2024-07-05 DIAGNOSIS — F4321 Adjustment disorder with depressed mood: Secondary | ICD-10-CM

## 2024-07-05 DIAGNOSIS — A419 Sepsis, unspecified organism: Secondary | ICD-10-CM | POA: Diagnosis not present

## 2024-07-05 DIAGNOSIS — N179 Acute kidney failure, unspecified: Secondary | ICD-10-CM | POA: Diagnosis not present

## 2024-07-05 LAB — CBC
HCT: 35.4 % — ABNORMAL LOW (ref 39.0–52.0)
Hemoglobin: 11.4 g/dL — ABNORMAL LOW (ref 13.0–17.0)
MCH: 27.1 pg (ref 26.0–34.0)
MCHC: 32.2 g/dL (ref 30.0–36.0)
MCV: 84.3 fL (ref 80.0–100.0)
Platelets: 204 K/uL (ref 150–400)
RBC: 4.2 MIL/uL — ABNORMAL LOW (ref 4.22–5.81)
RDW: 13.8 % (ref 11.5–15.5)
WBC: 8.4 K/uL (ref 4.0–10.5)
nRBC: 0 % (ref 0.0–0.2)

## 2024-07-05 LAB — COMPREHENSIVE METABOLIC PANEL WITH GFR
ALT: 16 U/L (ref 0–44)
AST: 14 U/L — ABNORMAL LOW (ref 15–41)
Albumin: 2 g/dL — ABNORMAL LOW (ref 3.5–5.0)
Alkaline Phosphatase: 68 U/L (ref 38–126)
Anion gap: 7 (ref 5–15)
BUN: 53 mg/dL — ABNORMAL HIGH (ref 8–23)
CO2: 21 mmol/L — ABNORMAL LOW (ref 22–32)
Calcium: 9.4 mg/dL (ref 8.9–10.3)
Chloride: 103 mmol/L (ref 98–111)
Creatinine, Ser: 1.52 mg/dL — ABNORMAL HIGH (ref 0.61–1.24)
GFR, Estimated: 48 mL/min — ABNORMAL LOW (ref >60)
Glucose, Bld: 88 mg/dL (ref 70–99)
Potassium: 4.2 mmol/L (ref 3.5–5.1)
Sodium: 131 mmol/L — ABNORMAL LOW (ref 135–145)
Total Bilirubin: 0.5 mg/dL (ref 0.0–1.2)
Total Protein: 5.4 g/dL — ABNORMAL LOW (ref 6.5–8.1)

## 2024-07-05 LAB — LACTIC ACID, PLASMA: Lactic Acid, Venous: 1 mmol/L (ref 0.5–1.9)

## 2024-07-05 LAB — MAGNESIUM: Magnesium: 1.8 mg/dL (ref 1.7–2.4)

## 2024-07-05 LAB — GLUCOSE, CAPILLARY: Glucose-Capillary: 92 mg/dL (ref 70–99)

## 2024-07-05 MED ORDER — HYDROCORTISONE SOD SUC (PF) 100 MG IJ SOLR
100.0000 mg | Freq: Once | INTRAMUSCULAR | Status: AC
  Administered 2024-07-05: 100 mg via INTRAVENOUS
  Filled 2024-07-05: qty 2

## 2024-07-05 NOTE — Plan of Care (Signed)
  Problem: Education: Goal: Knowledge of General Education information will improve Description: Including pain rating scale, medication(s)/side effects and non-pharmacologic comfort measures Outcome: Progressing   Problem: Clinical Measurements: Goal: Ability to maintain clinical measurements within normal limits will improve Outcome: Not Progressing Goal: Will remain free from infection Outcome: Not Progressing Goal: Respiratory complications will improve Outcome: Progressing Goal: Cardiovascular complication will be avoided Outcome: Progressing   Problem: Activity: Goal: Risk for activity intolerance will decrease Outcome: Not Progressing

## 2024-07-05 NOTE — Progress Notes (Signed)
  Subjective: Doing well no infectious symptoms.  Drain was removed from right side and abdominal wall abscess.  There is now draining significant purulent fluid.  Objective: Vital signs in last 24 hours: Temp:  [97.5 F (36.4 C)-99.7 F (37.6 C)] 97.5 F (36.4 C) (08/24 1637) Pulse Rate:  [67-100] 69 (08/24 1637) Resp:  [16-20] 18 (08/24 1637) BP: (86-104)/(50-62) 89/56 (08/24 1800) SpO2:  [96 %-99 %] 99 % (08/24 1637)  Intake/Output from previous day: 08/23 0701 - 08/24 0700 In: 2697.4 [I.V.:1274.8; IV Piggyback:1422.6] Out: 2325 [Urine:1775; Stool:550] Intake/Output this shift: No intake/output data recorded.  Physical Exam:  General: Alert and oriented CV: RRR Lungs: Clear Abdomen: Soft nontender nondistended, urostomy in place draining clear yellow urine.  Right side of abdomen where previous drain was placed is draining purulent fluid when placed with pressure minimal pain. GU: Bilateral nephrostomy tubes in place draining clear yellow urine.  Lab Results: Recent Labs    07/03/24 0835 07/04/24 0304 07/05/24 0239  HGB 12.5* 11.3* 11.4*  HCT 38.0* 33.6* 35.4*   BMET Recent Labs    07/04/24 0304 07/05/24 0239  NA 132* 131*  K 3.4* 4.2  CL 103 103  CO2 21* 21*  GLUCOSE 87 88  BUN 70* 53*  CREATININE 1.83* 1.52*  CALCIUM  9.3 9.4     Studies/Results: No results found.  Assessment/Plan: 39 M complex hx including RP, cystectomy and Ileal conduit, most recently had parstomal hernia repair with injury to base of conduit managed with bilateral nephrotomy tubes. Admitted for UTI/AKI, and dehydration.    1) UTI/AKI/dehydration:  As a precaution, will leave bilateral PCNs to drainage over the weekend and until renal function has returned to baseline (Cr 1.3).  This will also allow optimal treatment of his MRSA UTI.  He does not appear to be obstructed on right side but did have evidence of at least partial to near complete obstruction of left poorly functional  kidney.  Will leave left PCN to drainage for now and upon hospital discharge.  AKI likely multifactorial due to UTI, dehydration, and possibly a component of obstruction.  Now improving. - Cr continues to decrease   2) Urine leak: Resolved. Abdominal drain removed per Dr. Sheldon and I agree.  Culture obtained and will need to be covered with antibiotics appropriately based on that polymicrobial culture.  Will defer this therapy and duration to Dr. Sheldon. - Ucx + fro MRSA and Enterococcus  - on cefepime  and vanc   3) right drain site leakage -Purulent drainage noted General Surgery made aware.  LOS: 4 days   Jackey Pea MD 07/05/2024, 7:16 PM [facility]

## 2024-07-05 NOTE — Progress Notes (Signed)
   07/05/24 1949  BiPAP/CPAP/SIPAP  BiPAP/CPAP/SIPAP Pt Type Adult  Reason BIPAP/CPAP not in use Non-compliant  BiPAP/CPAP /SiPAP Vitals  Pulse Rate 69  Resp 17  SpO2 98 %  Bilateral Breath Sounds Clear

## 2024-07-05 NOTE — Progress Notes (Signed)
 A/Ox3, delayed responses. Pt is feeling better after morning not feeling right. (CBG, checked and normal, VS stable, NIH as expected/MD was at the bedside))  Right lower abdomen has 2 small opened holes (from prior drain) During day about 300 cc of tan pus drained from it. MD is aware, Nephrology as well and will be communicated to surgery as well.  Abdominal wound looks in healing process, with attached boarders, and red-pink granulation tissue. Dressings changed.  Right nephrostomy is not draining much, Left about 200. Ileostomy/with red stoma/200 output. Urostomy output is cloudy with sediments  Wife at the bedside, was updated by MDs. Bed in low position, alarms are on, call bell in reach, frequent turns.

## 2024-07-05 NOTE — Progress Notes (Signed)
 Progress Note   Patient: Kristopher Thompson FMW:[id number] DOB: 07/17/1951 DOA: 07/01/2024  DOS: the patient was seen and examined on 07/05/2024   Brief hospital course:  Patient is a 73 year old with complicated history of HTN, CHFpEF, CAD, T2DM, PE on Eliquis , prostate cancer, bladder cancer who was admitted back in late May for incarcerated incisional hernia and urostomy ileal conduit revision.  Postoperative course was complicated by bowel perforation, sepsis, ICU admission with intubation, requiring pressors and CRRT.  Patient had bilateral nephrostomy tubes placed as well and had been in inpatient rehab until August 8 of this year.  At that time a study was done that supported capping the nephrostomy tubes.  Patient was sent home and was doing well including ambulating on his own, was tolerating p.o., had good urine output until approximately 3 days ago.  reports decreased urine output from about a liter a day to down to 300 in the last 24 hours.  Found to have aki and possible pyelonephritis.   Assessment and Plan:   Concern for severe sepsis-infected abdominal wall drain - Tachycardia, encephalopathy, attention, pyelonephritis on presentation concern for sepsis.  IV fluid bolus, blood cultures, empiric antibiotics on board.  Monitor blood pressure closely.  Etiology of infectious source include possible pyelonephritis as well as abdominal wall drain with some purulent drainage.  This was removed by general surgery 8/21, tip Culture noting abundant e. Coli and Corynebacterium E. coli resistant to ampicillin, cefazolin , Cipro , Bactrim .  Remains on cefepime , vancomycin .  Blood cultures NGTD.  Blood pressure still a bit soft, will order 1 more bolus again empirically.  Acute kidney injury - Creatinine 3.57 on presentation, markedly above baseline 1-1.3.  Multi factorial etiology most likely secondary to ongoing urinary obstruction.  Percutaneous nephrostomy tubes uncapped and IV fluids on board.   Showing improvement in urine output and renal function this morning, creatinine down to 1.57.  Will recheck BMP in AM.  IV fluids on board   Concern for urinary obstruction with L>R hydronephrosis - Nephrostomy tubes uncapped.  Evaluated by urology and following closely.  Urine output excellent bilaterally.  Right side slightly cloudy but otherwise improving.   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  Nephrostomy tubes initially capped as urine output was good.  However given AKI and decreased urine output, now uncapped.  Evaluation by urology, general surgery.  No concern for urine leak.  Previous abdominal wall drain removed by general surgery 8/21.  Culture noting abundant e. Coli and Corynebacterium E. coli resistant to ampicillin, cefazolin , Cipro , Bactrim .  Remains on cefepime , vancomycin .   Possible adrenal insufficiency - Noted mild hyper bow natremia with soft blood pressures.  Patient on fludrocortisone  p.o.  Will give one-time dose of hydrocortisone  100 mg IV.  Monitor response.  May require subsequent continued hydrocortisone  dosing.   Possible pyelonephritis - Leukocytosis, stranding noted on CT scan.  Continues on cefepime  and vanc.  Empiric urine cultures positive for MRSA as well as Enterococcus.   Obstructive sleep apnea - CPAP.   Chronic anticoagulation - Eliquis  on board   Subjective: Patient sitting up in bed this morning, wife at bedside.  Reportedly states he feels a bit unwell this morning.  Denies dizziness but states he feels off.  No fevers reported.  Blood pressure stable.  Glucose greater than 90.  Was nauseous and did not want breakfast this morning.  Otherwise denies shortness of breath, chest  pain, abdominal pain, vomiting.  Physical Exam:  Vitals:   07/04/24 1939 07/04/24 2337 07/05/24 0303 07/05/24 0809  BP: (!) 93/59 (!) 86/51 (!) 91/50 (!) 96/54  Pulse:  67 79 100  Resp: 16 18 20    Temp: 98.2 F (36.8 C) 99.7 F (37.6 C) 99.3 F (37.4 C) 98.2 F (36.8 C)  TempSrc: Oral Oral Oral Oral  SpO2: 96% 96% 97%   Weight:      Height:        GENERAL:  Alert, pleasant, malaise HEENT:  EOMI CARDIOVASCULAR:  RRR, no murmurs appreciated RESPIRATORY:  Clear to auscultation, no wheezing, rales, or rhonchi GASTROINTESTINAL: BL nephrostomy tubes, soft, nontender, nondistended EXTREMITIES:  No LE edema bilaterally NEURO:  No new focal deficits appreciated SKIN:  No rashes noted PSYCH:  Appropriate mood and affect    Data Reviewed:  Imaging Studies: CT ABDOMEN PELVIS WO CONTRAST Result Date: 07/02/2024 CLINICAL DATA:  Suspected sepsis EXAM: CT ABDOMEN AND PELVIS WITHOUT CONTRAST TECHNIQUE: Multidetector CT imaging of the abdomen and pelvis was performed following the standard protocol without IV contrast. RADIATION DOSE REDUCTION: This exam was performed according to the departmental dose-optimization program which includes automated exposure control, adjustment of the mA and/or kV according to patient size and/or use of iterative reconstruction technique. COMPARISON:  Chest x-ray 07/01/2024, CT 06/17/2024, 05/12/2024 FINDINGS: Lower chest: Lung bases demonstrates interval nodular airspace disease and ground-glass density in the left greater than right lung bases. No pleural effusion. Coronary vascular calcification. Hepatobiliary: No focal liver abnormality is seen. Status post cholecystectomy. No biliary dilatation. Pancreas: Chronic pancreatitis. No acute inflammation. Stable 10 mm low-density lesion at the distal pancreas, series 3, image 24. Spleen: Normal in size without focal abnormality. Adrenals/Urinary Tract: Adrenal glands are normal. Patient is status post cysto  prostatectomy with right abdominal ileal conduit. Bilateral percutaneous nephrostomy catheters remain in place. Right-sided ureteral stent, extending from the renal pelvis to the right abdominal urostomy. Residual mild to moderate right-sided hydronephrosis but slightly diminished compared to most recent prior CT. Atrophic left kidney. Interval moderate severe left hydronephrosis and hydroureter, ureter dilated down to the ileal conduit, stricture was demonstrated on nephrostogram 05/11/2024. Small gas bubble in the left renal pelvis. Slight increased left perinephric stranding with mild stranding about the now dilated left ureter. Stomach/Bowel: Stomach is nondistended. No dilated small bowel. Contrast within the colon. No acute bowel wall thickening. Right abdominal ileal conduit. Separate right abdominal ileostomy. 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 Vascular/Lymphatic: Aortic atherosclerosis. No aneurysm. No suspicious lymph nodes. Reproductive: Prostatectomy. Other: Negative for pelvic effusion. Fat containing right inguinal hernia. Drainage catheter in the subcutaneous soft tissues of the right abdominal wall near the urostomy. Decreased fluid collection within the right ventral pelvic wall, limited characterization without contrast. Sample measurement right laterally measures about 2.5 cm on series 3, image 66, previously 3.4 cm. Musculoskeletal: No acute osseous abnormality. Old superior endplate deformity at L1 IMPRESSION: 1. Interval development of moderate to severe left hydronephrosis and hydroureter, ureter dilated down to the ileal conduit, stricture was demonstrated on nephrostogram 05/11/2024. Slight increased left perinephric stranding with mild stranding about the now dilated left ureter, correlate for superimposed infection. Correlate also for function of the left percutaneous nephrostomy tube. 2. Bilateral percutaneous nephrostomy catheters remain in place. Right-sided  ureteral stent, extending from the renal pelvis to the right abdominal urostomy. Residual mild to moderate right-sided hydronephrosis but slightly  diminished compared to most recent prior CT. 3. Interval nodular airspace disease and ground-glass density in the left greater than right lung bases, suspect for pneumonia and or aspiration. 4. Drainage catheter in the subcutaneous soft tissues of the right abdominal wall near the urostomy. Decreased fluid collection within the right ventral pelvic wall, limited characterization without contrast. 5. Aortic atherosclerosis. Aortic Atherosclerosis (ICD10-I70.0). Electronically Signed   By: Luke Bun M.D.   On: 07/02/2024 01:57   DG Chest Port 1 View if patient is in a treatment room. Result Date: 07/01/2024 CLINICAL DATA: Suspected sepsis EXAM: PORTABLE CHEST 1 VIEW COMPARISON:  Chest radiograph June 12, 2024. FINDINGS: The heart size and mediastinal contours are within normal limits. Both lungs are clear. The visualized skeletal structures are unremarkable. IMPRESSION: No active disease. Electronically Signed   By: Megan  Zare M.D.   On: 07/01/2024 17:45   DG Loopogram Result Date: 06/17/2024 INDICATION: Patient with a history of a radical cystectomy/ileal conduit urinary diversion with a known leak of the ileal conduit with bilateral nephrostomy tubes placed 05/04/24 as well as placement of a right retrograde ureteral stent 05/14/24. Request for loopogram to assess for further evidence of urinary leak. COMPARISON:  DG LOOPOGRAM 05/12/24 CONTRAST:  A total of 60 mL Isovue -300 administered was administered into the bilateral nephrostomy tubes as well as retrograde ureteral stent. FLUOROSCOPY TIME:  21 mGy COMPLICATIONS: None immediate. TECHNIQUE: Informed written consent was obtained from the patient after a discussion of the risks, benefits and alternatives to treatment. Questions regarding the procedure were encouraged and answered. A timeout was performed prior to  the initiation of the procedure. A pre procedural spot fluoroscopic image was obtained. The bilateral flank and external portions of existing nephrostomy catheters and right ureteral stent were prepped and sterilized. Antegrade injection of 20 mL of contrast into the existing right nephrostomy catheter demonstrated appropriate positioning within the renal pelvis. After several minutes, no contrast was identified within the right ureter or ileal conduit. Antegrade injection of 20 mL of contrast into the existing left nephrostomy catheter demonstrated appropriate positioning within the renal pelvis. After several minutes, a small volume of contrast was noted to flow through the left ureter into the ileal conduit. Retrograde injection of the right ureteral stent opacified the right ureter and right renal collecting system. No contrast extravasation observed. Temporary dressing applied to the ileal conduit ostomy site. Bilateral nephrostomy tubes were capped as requested by ordering MD. The patient tolerated the procedure well without immediate postprocedural complication. FINDINGS: The existing right nephrostomy catheter is appropriately positioned and functioning. Right retrograde ureteral stent in place through ileal conduit. IMPRESSION: 1. Right nephrostogram via the right nephrostomy tube and right ureteral stent demonstrates normally patent ureter and ureteral anastomosis with the ileal conduit. No right-sided urine leak demonstrated. 2. Left nephrostogram through left nephrostomy tube appears in good position with evidence of contrast flow to the distal ureter. No left-sided urine leak demonstrated. Performed by: Kacie Matthews PA-C Electronically Signed   By: Ester Sides M.D.   On: 06/17/2024 16:00   CT ABDOMEN PELVIS W WO CONTRAST Result Date: 06/17/2024 CLINICAL DATA:  Abdominal pain, postop, ileal conduit leak. EXAM: CT ABDOMEN AND PELVIS WITHOUT AND WITH CONTRAST TECHNIQUE: Multidetector CT imaging of  the abdomen and pelvis was performed following the standard protocol before and following the bolus administration of intravenous contrast. RADIATION DOSE REDUCTION: This exam was performed according to the departmental dose-optimization program which includes automated exposure control, adjustment of the mA and/or kV  according to patient size and/or use of iterative reconstruction technique. CONTRAST:  75mL OMNIPAQUE  IOHEXOL  350 MG/ML SOLN COMPARISON:  05/12/2024. FINDINGS: Lower chest: A few tiny pulmonary nodules are unchanged from 08/29/2018 and are considered benign. Heart size normal. No pericardial effusion. No pleural effusion. Distal esophagus is grossly unremarkable. Hepatobiliary: Liver is grossly unremarkable. Cholecystectomy. No biliary ductal dilatation. Pancreas: Chronic calcific pancreatitis. 1.3 cm low-attenuation lesion in the pancreatic tail (3/21), unchanged from 08/29/2018, compatible with a pseudocyst. Spleen: Negative. Adrenals/Urinary Tract: Negative. Stomach/Bowel: Bilateral percutaneous nephrostomies with contrast in the collecting systems bilaterally, secondary to a loopogram performed prior to this study. Possible small stone in the right kidney (3/24). Persistent moderate to severe dilatation of the right intrarenal collecting system and right ureter with a double-J right ureteral stent in place, proximal loop in the right renal pelvis and distal portion the on the right lower quadrant ileal conduit. Left kidney is atrophic and scarred. Partial opacification of the left ureter which is decompressed. Ileal conduit in the right lower quadrant which may be stenotic as it traverses the abdominal wall, as on 05/12/2024. No extraluminal contrast. Cysto prostatectomy. Vascular/Lymphatic: Stomach is unremarkable. Right lower quadrant ileostomy and separate ileal conduit. Small bowel and colon are otherwise unremarkable. Appendix is not readily visualized. Reproductive: Prostatectomy. Other:  Small right inguinal hernia contains fat. Enlarging broad fluid collection along the right ventral pelvic wall, just above the ileal conduit, measuring 3.4 x 21.6 cm. A percutaneous drain is seen within. No free fluid or extraluminal contrast. Musculoskeletal: Degenerative changes in the spine. Old L1 compression fracture. IMPRESSION: 1. 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 2. Enlarging fluid collection along the right ventral pelvic abdominal wall with a percutaneous drain in place, indicative of an abscess. 3. 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 4. Right lower quadrant ileostomy. 5. Probable small right renal stone. 6. Chronic calcific pancreatitis. Electronically Signed   By: Newell Eke M.D.   On: 06/17/2024 15:47   DG Chest 2 View Result Date: 06/12/2024 CLINICAL DATA:  10031 Cough 10031 EXAM: CHEST - 2 VIEW COMPARISON:  Chest x-ray 05/26/2024, CT chest 04/14/2024 FINDINGS: The heart and mediastinal contours are within normal limits. No focal consolidation. No pulmonary edema. No pleural effusion. No pneumothorax. No acute osseous abnormality. IMPRESSION: No active cardiopulmonary disease. Electronically Signed   By: Morgane  Naveau M.D.   On: 06/12/2024 18:56    There are no new results to review at this time.  Previous records (including but not limited to H&P, progress notes, nursing notes, TOC management) were reviewed in assessment of this patient.  Labs: CBC: Recent Labs  Lab 07/01/24 1716 07/02/24 0316 07/03/24 0835 07/04/24 0304 07/05/24 0239  WBC 16.9* 11.8* 8.0 7.5 8.4  NEUTROABS 13.7*  --   --   --   --   HGB 15.4 13.7 12.5* 11.3* 11.4*  HCT 44.9 39.6 38.0* 33.6* 35.4*  MCV 79.9* 80.7 83.2 83.0 84.3  PLT 330 243 186 189 204   Basic Metabolic Panel: Recent Labs  Lab 07/01/24 1716 07/02/24 0316 07/03/24 0835 07/04/24 0304  07/05/24 0239  NA 120* 125* 131* 132* 131*  K 4.1 3.9 3.3* 3.4* 4.2  CL 84* 90* 99 103 103  CO2 16* 15* 18* 21* 21*  GLUCOSE 156* 109* 84 87 88  BUN 140* 123* 89* 70* 53*  CREATININE 3.57* 3.18* 2.12* 1.83* 1.52*  CALCIUM   11.1* 10.2 9.7 9.3 9.4  MG  --   --  2.3 2.0 1.8  PHOS  --  5.5*  --   --   --    Liver Function Tests: Recent Labs  Lab 07/01/24 1716 07/02/24 0316 07/04/24 0304 07/05/24 0239  AST 25 22 16  14*  ALT 34 27 17 16   ALKPHOS 123 89 67 68  BILITOT 1.1 0.7 0.4 0.5  PROT 7.5 6.0* 5.1* 5.4*  ALBUMIN  3.7 2.8* 2.0* 2.0*   CBG: Recent Labs  Lab 07/05/24 0815  GLUCAP 92    Scheduled Meds:  apixaban   5 mg Oral BID   Chlorhexidine  Gluconate Cloth  6 each Topical Daily   cyanocobalamin   500 mcg Oral Q M,W,F   cyproheptadine   4 mg Oral QHS   feeding supplement  237 mL Oral BID BM   fludrocortisone   0.2 mg Oral Daily   Gerhardt's butt cream  1 Application Topical BID   leptospermum manuka honey  1 Application Topical Daily   loperamide   4 mg Oral TID   melatonin  3 mg Oral QHS   metoCLOPramide   5 mg Oral TID AC   midodrine   15 mg Oral TID WC   multivitamin with minerals  1 tablet Oral Daily   nutrition supplement (JUVEN)  1 packet Oral BID BM   omega-3 acid ethyl esters  1 g Oral QODAY   mouth rinse  15 mL Mouth Rinse 4 times per day   psyllium  1 packet Oral Daily   rosuvastatin   10 mg Oral QPM   sertraline   100 mg Oral Daily   Continuous Infusions:  ceFEPime  (MAXIPIME ) IV Stopped (07/05/24 0434)   lactated ringers  75 mL/hr at 07/05/24 0700   vancomycin  Stopped (07/04/24 1811)   PRN Meds:.acetaminophen , alum & mag hydroxide-simeth, cyclobenzaprine , hydrOXYzine , ipratropium, metoCLOPramide  (REGLAN ) injection, mouth rinse, oxyCODONE -acetaminophen , prochlorperazine , sodium chloride   Family Communication: Wife at bedside  Disposition: Status is: Inpatient Remains inpatient appropriate because: Severe sepsis     Time spent: 50 minutes  Length of  inpatient stay: 4 days  Author: Carliss LELON Canales, DO 07/05/2024 10:43 AM  For on call review [email or web address].

## 2024-07-06 DIAGNOSIS — B9620 Unspecified Escherichia coli [E. coli] as the cause of diseases classified elsewhere: Secondary | ICD-10-CM

## 2024-07-06 DIAGNOSIS — C679 Malignant neoplasm of bladder, unspecified: Secondary | ICD-10-CM | POA: Diagnosis not present

## 2024-07-06 DIAGNOSIS — N1330 Unspecified hydronephrosis: Secondary | ICD-10-CM | POA: Diagnosis not present

## 2024-07-06 DIAGNOSIS — B9689 Other specified bacterial agents as the cause of diseases classified elsewhere: Secondary | ICD-10-CM

## 2024-07-06 DIAGNOSIS — L02211 Cutaneous abscess of abdominal wall: Secondary | ICD-10-CM

## 2024-07-06 DIAGNOSIS — N179 Acute kidney failure, unspecified: Secondary | ICD-10-CM | POA: Diagnosis not present

## 2024-07-06 DIAGNOSIS — A419 Sepsis, unspecified organism: Secondary | ICD-10-CM | POA: Diagnosis not present

## 2024-07-06 LAB — COMPREHENSIVE METABOLIC PANEL WITH GFR
ALT: 15 U/L (ref 0–44)
AST: 14 U/L — ABNORMAL LOW (ref 15–41)
Albumin: 1.7 g/dL — ABNORMAL LOW (ref 3.5–5.0)
Alkaline Phosphatase: 60 U/L (ref 38–126)
Anion gap: 6 (ref 5–15)
BUN: 46 mg/dL — ABNORMAL HIGH (ref 8–23)
CO2: 19 mmol/L — ABNORMAL LOW (ref 22–32)
Calcium: 9.5 mg/dL (ref 8.9–10.3)
Chloride: 108 mmol/L (ref 98–111)
Creatinine, Ser: 1.37 mg/dL — ABNORMAL HIGH (ref 0.61–1.24)
GFR, Estimated: 55 mL/min — ABNORMAL LOW (ref >60)
Glucose, Bld: 91 mg/dL (ref 70–99)
Potassium: 3.8 mmol/L (ref 3.5–5.1)
Sodium: 133 mmol/L — ABNORMAL LOW (ref 135–145)
Total Bilirubin: 0.4 mg/dL (ref 0.0–1.2)
Total Protein: 5 g/dL — ABNORMAL LOW (ref 6.5–8.1)

## 2024-07-06 LAB — CULTURE, BLOOD (ROUTINE X 2)
Culture: NO GROWTH
Culture: NO GROWTH

## 2024-07-06 LAB — CBC
HCT: 31.7 % — ABNORMAL LOW (ref 39.0–52.0)
Hemoglobin: 10.3 g/dL — ABNORMAL LOW (ref 13.0–17.0)
MCH: 27.4 pg (ref 26.0–34.0)
MCHC: 32.5 g/dL (ref 30.0–36.0)
MCV: 84.3 fL (ref 80.0–100.0)
Platelets: 221 K/uL (ref 150–400)
RBC: 3.76 MIL/uL — ABNORMAL LOW (ref 4.22–5.81)
RDW: 14.1 % (ref 11.5–15.5)
WBC: 7.5 K/uL (ref 4.0–10.5)
nRBC: 0 % (ref 0.0–0.2)

## 2024-07-06 LAB — PHOSPHORUS: Phosphorus: 1.8 mg/dL — ABNORMAL LOW (ref 2.5–4.6)

## 2024-07-06 LAB — MAGNESIUM: Magnesium: 1.8 mg/dL (ref 1.7–2.4)

## 2024-07-06 MED ORDER — SODIUM CHLORIDE 0.9 % IV SOLN
2.0000 g | INTRAVENOUS | Status: DC
  Administered 2024-07-07 – 2024-07-11 (×5): 2 g via INTRAVENOUS
  Filled 2024-07-06 (×5): qty 20

## 2024-07-06 MED ORDER — POTASSIUM CHLORIDE 10 MEQ/100ML IV SOLN
INTRAVENOUS | Status: AC
Start: 2024-07-06 — End: 2024-07-07
  Filled 2024-07-06: qty 100

## 2024-07-06 MED ORDER — VANCOMYCIN HCL 1250 MG/250ML IV SOLN
1250.0000 mg | INTRAVENOUS | Status: DC
  Administered 2024-07-06 – 2024-07-08 (×3): 1250 mg via INTRAVENOUS
  Filled 2024-07-06 (×3): qty 250

## 2024-07-06 NOTE — Progress Notes (Signed)
 Physical Therapy Treatment Patient Details Name: Kristopher Thompson MRN: [id number] DOB: 21-Jul-1951 Today's Date: 07/06/2024   History of Present Illness 73 y/o male admitted 07/01/24 from home with new onset of weakness, muffled voice, Lt hydronephrosis, obstructive renal failure and PNA. PMHx: Admit 5/30-7/11 for hernia repair with bowel perforation and DC to snf, readmit 7/14-7/16 with D/C to CIR 7/16-8/8. HTN, dCHF, CAD, DM2, PE on Eliquis , anemia, obesity, prostate and bladder CA, hernia repair, urostomy, colostomy, RBBB    PT Comments  Pt with flat affect, HOH without hearing aids this date and able to progress to standing and OOB. Pt with slow processing, fatigue and weakness from baseline who stands well with assist and stedy with recommendation for continued use of stedy with staff. Pt educated for seated HEP, progression and agree with plan. Wife present throughout, will continue to follow. Patient will benefit from intensive inpatient follow-up therapy, >3 hours/day  Supine 88/64 (72) HR 84 Sitting 111/61 (76 with ted Standing 100/68 (78) HR 132    If plan is discharge home, recommend the following: A lot of help with walking and/or transfers;A lot of help with bathing/dressing/bathroom;Assist for transportation;Help with stairs or ramp for entrance   Can travel by private vehicle        Equipment Recommendations  None recommended by PT    Recommendations for Other Services       Precautions / Restrictions Precautions Precautions: Fall Recall of Precautions/Restrictions: Impaired Precaution/Restrictions Comments: urostomy, colostomy, bil PCN drains     Mobility  Bed Mobility Overal bed mobility: Needs Assistance Bed Mobility: Supine to Sit     Supine to sit: Mod assist, HOB elevated, Used rails     General bed mobility comments: mod assist to clear legs, scoot hips to EOB and fully elevate trunk with HOB 30 degrees, use of rail and mod cues for sequence     Transfers Overall transfer level: Needs assistance   Transfers: Sit to/from Stand, Bed to chair/wheelchair/BSC Sit to Stand: Mod assist, +2 physical assistance, From elevated surface, Via lift equipment Stand pivot transfers: Mod assist, +2 physical assistance         General transfer comment: mod +2 to rise from bed with use of pad at sacrum, +2 mod assist stand pivot with pt holding tech and therapist with sequential small steps. pt then stood from recliner with stedy mod +2 and repeated 3 standing trials from stedy pads with min assist with cues for hand placement and safety    Ambulation/Gait               General Gait Details: not yet able   Stairs             Wheelchair Mobility     Tilt Bed    Modified Rankin (Stroke Patients Only)       Balance Overall balance assessment: Needs assistance Sitting-balance support: Feet supported, No upper extremity supported Sitting balance-Leahy Scale: Fair     Standing balance support: Bilateral upper extremity supported Standing balance-Leahy Scale: Poor Standing balance comment: UB support in standing                            Communication Communication Communication: Impaired Factors Affecting Communication: Hearing impaired  Cognition Arousal: Alert Behavior During Therapy: Flat affect   PT - Cognitive impairments: Difficult to assess, Safety/Judgement, Problem solving Difficult to assess due to: Hard of hearing/deaf  PT - Cognition Comments: pt without hearing aids impairing ability to assess cognition, slow processing Following commands: Impaired Following commands impaired: Follows one step commands with increased time    Cueing Cueing Techniques: Verbal cues, Gestural cues  Exercises General Exercises - Lower Extremity Long Arc Quad: AROM, Both, 10 reps, Seated    General Comments        Pertinent Vitals/Pain Pain Assessment Pain Assessment:  No/denies pain    Home Living                          Prior Function            PT Goals (current goals can now be found in the care plan section) Progress towards PT goals: Progressing toward goals    Frequency    Min 3X/week      PT Plan      Co-evaluation              AM-PAC PT 6 Clicks Mobility   Outcome Measure  Help needed turning from your back to your side while in a flat bed without using bedrails?: A Lot Help needed moving from lying on your back to sitting on the side of a flat bed without using bedrails?: A Lot Help needed moving to and from a bed to a chair (including a wheelchair)?: Total Help needed standing up from a chair using your arms (e.g., wheelchair or bedside chair)?: Total Help needed to walk in hospital room?: Total Help needed climbing 3-5 steps with a railing? : Total 6 Click Score: 8    End of Session Equipment Utilized During Treatment: Other (comment) (thigh high ted hose) Activity Tolerance: Patient tolerated treatment well Patient left: in chair;with call bell/phone within reach;with chair alarm set;with family/visitor present Nurse Communication: Mobility status PT Visit Diagnosis: Muscle weakness (generalized) (M62.81);Other abnormalities of gait and mobility (R26.89);Other symptoms and signs involving the nervous system (R29.898)     Time: 9242-9172 PT Time Calculation (min) (ACUTE ONLY): 30 min  Charges:    $Therapeutic Exercise: 8-22 mins $Therapeutic Activity: 8-22 mins PT General Charges $$ ACUTE PT VISIT: 1 Visit                     [name], PT Acute Rehabilitation Services Office: [phone]    Lenoard NOVAK Krysia Zahradnik 07/06/2024, 9:10 AM

## 2024-07-06 NOTE — Progress Notes (Signed)
 Pharmacy Antibiotic Note  Kristopher Thompson is a 73 y.o. male admitted on 07/01/2024 with UTI/obstructed nephrostomy tube.   Pharmacy has been consulted for Vancomycin  and Cefepime  dosing. AKI on admit, improviinb.  Vancomycin  2gm IV x1 given 8/21 am with random level 8/22 am 18, MRSA and Enterococcus in urine culture > 1gm IV q24h begun 8/22.  Creatinine continues to improve and expect current Vanc dose is providing suboptimal AUC.  Surgery consulting ID for outpatient antibiotic plan; expecting prolonged course.  Plan: Increase Vancomycin  1gm to 1250 mg IV q24h. Estimated AUC 406. Conservative increase. Continue Cefepime  2gm IV q12h. Monitor renal function, clinical progress and antibiotic plans. Follow up ID recommendations.  Height: 5' 11 (180.3 cm) Weight: 90 kg (198 lb 6.6 oz) IBW/kg (Calculated) : 75.3  Temp (24hrs), Avg:98.2 F (36.8 C), Min:97.5 F (36.4 C), Max:98.9 F (37.2 C)  Recent Labs  Lab 07/01/24 1808 07/02/24 0316 07/03/24 0835 07/04/24 0304 07/05/24 0239 07/05/24 2142 07/06/24 0259  WBC  --  11.8* 8.0 7.5 8.4  --  7.5  CREATININE  --  3.18* 2.12* 1.83* 1.52*  --  1.37*  LATICACIDVEN 1.9  --   --   --   --  1.0  --   VANCORANDOM  --   --  18  --   --   --   --     Estimated Creatinine Clearance: 51.9 mL/min (A) (by C-G formula based on SCr of 1.37 mg/dL (H)).    Allergies  Allergen Reactions   Demerol  [Meperidine ] Nausea And Vomiting    Antimicrobials this admission: Vancomycin  8/21 >  Cefepime  8/20> Medihoney to wound 8/21>>  Dose adjustments this admission: 8/22 VR 18 at 0835 - ~40 hrs after Vanc load but AKI improving > Vanc 1gm q24h begun - Cefepime  2 gm IV q24h > q12h for improved renal function 8/26: SCr improved to 1.37>> Vanc 1gm > 1250 mg IV q24h for [facility] 406  Microbiology results: 8/20 blood: negative 8/20 urine: >100K/ml MRSA (MIC to Vanc 1; also sens TCN and linezolid  but R to cipro  and septra ) + >100K/ml Enterococcus faecium  (MIC to Vanc < 0.5) 8/21 JP drainage (RLQ): abundant E coli (send Cefepime , Erta, CRO, Mero, Zosyn ) and Corynebacterium  Thank you for allowing pharmacy to be a part of this patient's care.  Kristopher Thompson, [place] 07/06/2024 3:04 PM

## 2024-07-06 NOTE — Progress Notes (Signed)
 Inpatient Rehab Admissions Coordinator:  Awaiting medical readiness for CIR. Will continue to follow.   Tinnie Yvone Cohens, MS, CCC-SLP Admissions Coordinator [phone]

## 2024-07-06 NOTE — Plan of Care (Signed)
  Problem: Education: Goal: Knowledge of General Education information will improve Description: Including pain rating scale, medication(s)/side effects and non-pharmacologic comfort measures Outcome: Progressing   Problem: Health Behavior/Discharge Planning: Goal: Ability to manage health-related needs will improve Outcome: Progressing   Problem: Clinical Measurements: Goal: Ability to maintain clinical measurements within normal limits will improve Outcome: Progressing Goal: Cardiovascular complication will be avoided Outcome: Progressing   Problem: Nutrition: Goal: Adequate nutrition will be maintained Outcome: Progressing   Problem: Elimination: Goal: Will not experience complications related to bowel motility Outcome: Progressing

## 2024-07-06 NOTE — Progress Notes (Signed)
 Progress Note   Patient: Kristopher Thompson FMW:[id number] DOB: [birth year] DOA: 07/01/2024  DOS: the patient was seen and examined on 07/06/2024   Brief hospital course:  Patient is a 73 year old with complicated history of HTN, CHFpEF, CAD, T2DM, PE on Eliquis , prostate cancer, bladder cancer who was admitted back in late May for incarcerated incisional hernia and urostomy ileal conduit revision.  Postoperative course was complicated by bowel perforation, sepsis, ICU admission with intubation, requiring pressors and CRRT.  Patient had bilateral nephrostomy tubes placed as well and had been in inpatient rehab until August 8 of this year.  At that time a study was done that supported capping the nephrostomy tubes.  Patient was sent home and was doing well including ambulating on his own, was tolerating p.o., had good urine output until approximately 3 days ago.  reports decreased urine output from about a liter a day to down to 300 in the last 24 hours.  Found to have aki and possible pyelonephritis.   Assessment and Plan:   Concern for severe sepsis-infected abdominal wall drain - Tachycardia, encephalopathy, attention, pyelonephritis on presentation concern for sepsis.  IV fluid bolus, blood cultures, empiric antibiotics on board.  Monitor blood pressure closely.  Etiology of infectious source include possible pyelonephritis as well as abdominal wall drain with some purulent drainage.  This was removed by general surgery 8/21, tip Culture noting abundant e. Coli and Corynebacterium E. coli resistant to ampicillin, cefazolin , Cipro , Bactrim .  Remains on cefepime , vancomycin .  Blood cultures NGTD.  Purulence improving but still persistent.  Evaluated closely by general surgery with recommendations for likely prolonged antibiotic course.  Going to reach out to infectious disease for ongoing outpatient recommendations.  No indication for drain or surgical intervention at this time.     Acute kidney  injury - Creatinine 3.57 on presentation, markedly above baseline 1-1.3.  Multi factorial etiology most likely secondary to ongoing urinary obstruction.  Percutaneous nephrostomy tubes uncapped and IV fluids on board.  Showing improvement in urine output and renal function this morning, creatinine down to 1.37.  Will recheck BMP in AM.  IV fluids on board   Concern for urinary obstruction with L>R hydronephrosis - Nephrostomy tubes uncapped.  Evaluated by urology and following closely.  Urine output excellent bilaterally.  Right side slightly cloudy but otherwise improving.   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  Nephrostomy tubes initially capped as urine output was good.  However given AKI and decreased urine output, now uncapped.  Evaluation by urology, general surgery.  No concern for urine leak.  Previous abdominal wall drain for bowel perforation and abscess removed by general surgery 8/21.  Culture noting abundant e. Coli and Corynebacterium E. coli resistant to ampicillin, cefazolin , Cipro , Bactrim .  Remains on cefepime , vancomycin .   Possible adrenal insufficiency - Noted mild hyper bow natremia with soft blood pressures.  Patient on fludrocortisone  p.o. received one-time dose of hydrocortisone  100 mg IV with minimal response.  Blood pressure appears stable.   Possible pyelonephritis - Leukocytosis, stranding noted on CT scan.  Continues on cefepime  and vanc.  Empiric urine cultures positive for MRSA as well as Enterococcus.   Obstructive sleep apnea - CPAP.   Chronic anticoagulation - Eliquis  on board  Physical debilitation muscle weakness - Evaluated by PT/OT.  Multiple hospitalizations recently.  Likely  transition back to CIR.  Working closely with case management on disposition planning.   Subjective: Patient evaluated this morning, wife at bedside.  Patient feeling improved from yesterday.  No longer malaise, nauseous.  More alert.  About to work with physical therapy this morning.  Still noting some purulent drainage in the right abdominal area where his drain was removed but no redness.  Urine output in BL nephrostomy is good.  Physical Exam:  Vitals:   07/06/24 0500 07/06/24 0700 07/06/24 0747 07/06/24 0902  BP:  (!) 86/62 (!) 90/57 100/68  Pulse: (!) 59     Resp: 19     Temp: 97.7 F (36.5 C)  98.2 F (36.8 C)   TempSrc: Axillary  Oral   SpO2:   97%   Weight: 90 kg     Height:        GENERAL:  Alert, pleasant, malaise HEENT:  EOMI CARDIOVASCULAR:  RRR, no murmurs appreciated RESPIRATORY:  Clear to auscultation, no wheezing, rales, or rhonchi GASTROINTESTINAL: Ventral surgical incision with granulation tissue, BL nephrostomy tubes, right flank wound from previous drain, mild purulence covered in fresh dressing, soft, nontender, nondistended EXTREMITIES:  No LE edema bilaterally NEURO:  No new focal deficits appreciated SKIN:  No rashes noted PSYCH:  Appropriate mood and affect    Data Reviewed:  Imaging Studies: CT ABDOMEN PELVIS WO CONTRAST Result Date: 07/02/2024 CLINICAL DATA:  Suspected sepsis EXAM: CT ABDOMEN AND PELVIS WITHOUT CONTRAST TECHNIQUE: Multidetector CT imaging of the abdomen and pelvis was performed following the standard protocol without IV contrast. RADIATION DOSE REDUCTION: This exam was performed according to the departmental dose-optimization program which includes automated exposure control, adjustment of the mA and/or kV according to patient size and/or use of iterative reconstruction technique. COMPARISON:  Chest x-ray 07/01/2024, CT 06/17/2024, 05/12/2024 FINDINGS: Lower chest: Lung bases demonstrates interval nodular airspace disease and  ground-glass density in the left greater than right lung bases. No pleural effusion. Coronary vascular calcification. Hepatobiliary: No focal liver abnormality is seen. Status post cholecystectomy. No biliary dilatation. Pancreas: Chronic pancreatitis. No acute inflammation. Stable 10 mm low-density lesion at the distal pancreas, series 3, image 24. Spleen: Normal in size without focal abnormality. Adrenals/Urinary Tract: Adrenal glands are normal. Patient is status post cysto prostatectomy with right abdominal ileal conduit. Bilateral percutaneous nephrostomy catheters remain in place. Right-sided ureteral stent, extending from the renal pelvis to the right abdominal urostomy. Residual mild to moderate right-sided hydronephrosis but slightly diminished compared to most recent prior CT. Atrophic left kidney. Interval moderate severe left hydronephrosis and hydroureter, ureter dilated down to the ileal conduit, stricture was demonstrated on nephrostogram 05/11/2024. Small gas bubble in the left renal pelvis. Slight increased left perinephric stranding with mild stranding about the now dilated left ureter. Stomach/Bowel: Stomach is nondistended. No dilated small bowel. Contrast within the colon. No acute bowel wall thickening. Right abdominal ileal conduit. Separate right abdominal ileostomy. 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 Vascular/Lymphatic: Aortic atherosclerosis. No aneurysm. No suspicious lymph nodes. Reproductive: Prostatectomy. Other: Negative for pelvic effusion. Fat containing right inguinal hernia. Drainage catheter in the subcutaneous soft tissues of the right abdominal wall near the urostomy. Decreased fluid collection within the right ventral pelvic wall, limited characterization without contrast. Sample measurement right laterally measures about 2.5 cm on series 3, image 66, previously 3.4 cm. Musculoskeletal: No acute osseous abnormality. Old superior endplate deformity at L1  IMPRESSION: 1. Interval development of moderate to severe left hydronephrosis and hydroureter, ureter dilated down to  the ileal conduit, stricture was demonstrated on nephrostogram 05/11/2024. Slight increased left perinephric stranding with mild stranding about the now dilated left ureter, correlate for superimposed infection. Correlate also for function of the left percutaneous nephrostomy tube. 2. Bilateral percutaneous nephrostomy catheters remain in place. Right-sided ureteral stent, extending from the renal pelvis to the right abdominal urostomy. Residual mild to moderate right-sided hydronephrosis but slightly diminished compared to most recent prior CT. 3. Interval nodular airspace disease and ground-glass density in the left greater than right lung bases, suspect for pneumonia and or aspiration. 4. Drainage catheter in the subcutaneous soft tissues of the right abdominal wall near the urostomy. Decreased fluid collection within the right ventral pelvic wall, limited characterization without contrast. 5. Aortic atherosclerosis. Aortic Atherosclerosis (ICD10-I70.0). Electronically Signed   By: Luke Bun M.D.   On: 07/02/2024 01:57   DG Chest Port 1 View if patient is in a treatment room. Result Date: 07/01/2024 CLINICAL DATA: Suspected sepsis EXAM: PORTABLE CHEST 1 VIEW COMPARISON:  Chest radiograph June 12, 2024. FINDINGS: The heart size and mediastinal contours are within normal limits. Both lungs are clear. The visualized skeletal structures are unremarkable. IMPRESSION: No active disease. Electronically Signed   By: Megan  Zare M.D.   On: 07/01/2024 17:45   DG Loopogram Result Date: 06/17/2024 INDICATION: Patient with a history of a radical cystectomy/ileal conduit urinary diversion with a known leak of the ileal conduit with bilateral nephrostomy tubes placed 05/04/24 as well as placement of a right retrograde ureteral stent 05/14/24. Request for loopogram to assess for further evidence of urinary  leak. COMPARISON:  DG LOOPOGRAM 05/12/24 CONTRAST:  A total of 60 mL Isovue -300 administered was administered into the bilateral nephrostomy tubes as well as retrograde ureteral stent. FLUOROSCOPY TIME:  21 mGy COMPLICATIONS: None immediate. TECHNIQUE: Informed written consent was obtained from the patient after a discussion of the risks, benefits and alternatives to treatment. Questions regarding the procedure were encouraged and answered. A timeout was performed prior to the initiation of the procedure. A pre procedural spot fluoroscopic image was obtained. The bilateral flank and external portions of existing nephrostomy catheters and right ureteral stent were prepped and sterilized. Antegrade injection of 20 mL of contrast into the existing right nephrostomy catheter demonstrated appropriate positioning within the renal pelvis. After several minutes, no contrast was identified within the right ureter or ileal conduit. Antegrade injection of 20 mL of contrast into the existing left nephrostomy catheter demonstrated appropriate positioning within the renal pelvis. After several minutes, a small volume of contrast was noted to flow through the left ureter into the ileal conduit. Retrograde injection of the right ureteral stent opacified the right ureter and right renal collecting system. No contrast extravasation observed. Temporary dressing applied to the ileal conduit ostomy site. Bilateral nephrostomy tubes were capped as requested by ordering MD. The patient tolerated the procedure well without immediate postprocedural complication. FINDINGS: The existing right nephrostomy catheter is appropriately positioned and functioning. Right retrograde ureteral stent in place through ileal conduit. IMPRESSION: 1. Right nephrostogram via the right nephrostomy tube and right ureteral stent demonstrates normally patent ureter and ureteral anastomosis with the ileal conduit. No right-sided urine leak demonstrated. 2. Left  nephrostogram through left nephrostomy tube appears in good position with evidence of contrast flow to the distal ureter. No left-sided urine leak demonstrated. Performed by: Kacie Matthews PA-C Electronically Signed   By: Ester Sides M.D.   On: 06/17/2024 16:00   CT ABDOMEN PELVIS W WO CONTRAST Result Date: 06/17/2024  CLINICAL DATA:  Abdominal pain, postop, ileal conduit leak. EXAM: CT ABDOMEN AND PELVIS WITHOUT AND WITH CONTRAST TECHNIQUE: Multidetector CT imaging of the abdomen and pelvis was performed following the standard protocol before and following the bolus administration of intravenous contrast. RADIATION DOSE REDUCTION: This exam was performed according to the departmental dose-optimization program which includes automated exposure control, adjustment of the mA and/or kV according to patient size and/or use of iterative reconstruction technique. CONTRAST:  75mL OMNIPAQUE  IOHEXOL  350 MG/ML SOLN COMPARISON:  05/12/2024. FINDINGS: Lower chest: A few tiny pulmonary nodules are unchanged from 08/29/2018 and are considered benign. Heart size normal. No pericardial effusion. No pleural effusion. Distal esophagus is grossly unremarkable. Hepatobiliary: Liver is grossly unremarkable. Cholecystectomy. No biliary ductal dilatation. Pancreas: Chronic calcific pancreatitis. 1.3 cm low-attenuation lesion in the pancreatic tail (3/21), unchanged from 08/29/2018, compatible with a pseudocyst. Spleen: Negative. Adrenals/Urinary Tract: Negative. Stomach/Bowel: Bilateral percutaneous nephrostomies with contrast in the collecting systems bilaterally, secondary to a loopogram performed prior to this study. Possible small stone in the right kidney (3/24). Persistent moderate to severe dilatation of the right intrarenal collecting system and right ureter with a double-J right ureteral stent in place, proximal loop in the right renal pelvis and distal portion the on the right lower quadrant ileal conduit. Left kidney is  atrophic and scarred. Partial opacification of the left ureter which is decompressed. Ileal conduit in the right lower quadrant which may be stenotic as it traverses the abdominal wall, as on 05/12/2024. No extraluminal contrast. Cysto prostatectomy. Vascular/Lymphatic: Stomach is unremarkable. Right lower quadrant ileostomy and separate ileal conduit. Small bowel and colon are otherwise unremarkable. Appendix is not readily visualized. Reproductive: Prostatectomy. Other: Small right inguinal hernia contains fat. Enlarging broad fluid collection along the right ventral pelvic wall, just above the ileal conduit, measuring 3.4 x 21.6 cm. A percutaneous drain is seen within. No free fluid or extraluminal contrast. Musculoskeletal: Degenerative changes in the spine. Old L1 compression fracture. IMPRESSION: 1. 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 2. Enlarging fluid collection along the right ventral pelvic abdominal wall with a percutaneous drain in place, indicative of an abscess. 3. 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 4. Right lower quadrant ileostomy. 5. Probable small right renal stone. 6. Chronic calcific pancreatitis. Electronically Signed   By: Newell Eke M.D.   On: 06/17/2024 15:47   DG Chest 2 View Result Date: 06/12/2024 CLINICAL DATA:  10031 Cough 10031 EXAM: CHEST - 2 VIEW COMPARISON:  Chest x-ray 05/26/2024, CT chest 04/14/2024 FINDINGS: The heart and mediastinal contours are within normal limits. No focal consolidation. No pulmonary edema. No pleural effusion. No pneumothorax. No acute osseous abnormality. IMPRESSION: No active cardiopulmonary disease. Electronically Signed   By: Morgane  Naveau M.D.   On: 06/12/2024 18:56    There are no new results to review at this time.  Previous records (including but not limited to H&P, progress notes, nursing  notes, TOC management) were reviewed in assessment of this patient.  Labs: CBC: Recent Labs  Lab 07/01/24 1716 07/02/24 0316 07/03/24 0835 07/04/24 0304 07/05/24 0239 07/06/24 0259  WBC 16.9* 11.8* 8.0 7.5 8.4 7.5  NEUTROABS 13.7*  --   --   --   --   --   HGB 15.4 13.7 12.5* 11.3* 11.4* 10.3*  HCT 44.9 39.6 38.0* 33.6* 35.4* 31.7*  MCV 79.9* 80.7 83.2 83.0 84.3 84.3  PLT  330 243 186 189 204 221   Basic Metabolic Panel: Recent Labs  Lab 07/02/24 0316 07/03/24 0835 07/04/24 0304 07/05/24 0239 07/06/24 0259  NA 125* 131* 132* 131* 133*  K 3.9 3.3* 3.4* 4.2 3.8  CL 90* 99 103 103 108  CO2 15* 18* 21* 21* 19*  GLUCOSE 109* 84 87 88 91  BUN 123* 89* 70* 53* 46*  CREATININE 3.18* 2.12* 1.83* 1.52* 1.37*  CALCIUM  10.2 9.7 9.3 9.4 9.5  MG  --  2.3 2.0 1.8 1.8  PHOS 5.5*  --   --   --  1.8*   Liver Function Tests: Recent Labs  Lab 07/01/24 1716 07/02/24 0316 07/04/24 0304 07/05/24 0239 07/06/24 0259  AST 25 22 16  14* 14*  ALT 34 27 17 16 15   ALKPHOS 123 89 67 68 60  BILITOT 1.1 0.7 0.4 0.5 0.4  PROT 7.5 6.0* 5.1* 5.4* 5.0*  ALBUMIN  3.7 2.8* 2.0* 2.0* 1.7*   CBG: Recent Labs  Lab 07/05/24 0815  GLUCAP 92    Scheduled Meds:  apixaban   5 mg Oral BID   Chlorhexidine  Gluconate Cloth  6 each Topical Daily   cyanocobalamin   500 mcg Oral Q M,W,F   cyproheptadine   4 mg Oral QHS   feeding supplement  237 mL Oral BID BM   fludrocortisone   0.2 mg Oral Daily   Gerhardt's butt cream  1 Application Topical BID   leptospermum manuka honey  1 Application Topical Daily   loperamide   4 mg Oral TID   melatonin  3 mg Oral QHS   metoCLOPramide   5 mg Oral TID AC   midodrine   15 mg Oral TID WC   multivitamin with minerals  1 tablet Oral Daily   nutrition supplement (JUVEN)  1 packet Oral BID BM   omega-3 acid ethyl esters  1 g Oral QODAY   mouth rinse  15 mL Mouth Rinse 4 times per day   psyllium  1 packet Oral Daily   rosuvastatin   10 mg Oral QPM   sertraline   100 mg  Oral Daily   Continuous Infusions:  ceFEPime  (MAXIPIME ) IV 2 g (07/06/24 0449)   lactated ringers  75 mL/hr at 07/06/24 0747   vancomycin  Stopped (07/05/24 1718)   PRN Meds:.acetaminophen , alum & mag hydroxide-simeth, cyclobenzaprine , hydrOXYzine , ipratropium, metoCLOPramide  (REGLAN ) injection, mouth rinse, oxyCODONE -acetaminophen , prochlorperazine , sodium chloride   Family Communication: Wife at bedside  Disposition: Status is: Inpatient Remains inpatient appropriate because: Sepsis     Time spent: 49 minutes  Length of inpatient stay: 5 days  Author: Carliss LELON Canales, DO 07/06/2024 11:06 AM  For on call review [email or web address].

## 2024-07-06 NOTE — Treatment Plan (Signed)
 Patient doing well with Cr downtrending, at baseline today. UoP adequate with vast majority of output via ostomy.   1) UTI/AKI/dehydration: AKI likely multifactorial due to UTI, dehydration, and possibly a component of obstruction. Resolved with Cr at baseline. As Cr has stabilized, will likely plan to cap right PCNU this afternoon and leave left PCN to drainage.  -Will plan to cap PCNU this afternoon, keep left PCN to drainage.  -VIR consulted to possible exchange while inpatient versus close follow-up outpatient.    2) Hx of urine leak: Resolved. Abdominal drain removed. Blood cultures negative. Culture from drain with G+ rods and E. Coli. Ucx with MRSA and Enterococcus. On Vanc/Cefepime . Defer abx to gen surg.   Kristopher GEANNIE Civil, MD Resident, PGY4 Department of Urology

## 2024-07-06 NOTE — Plan of Care (Signed)
  Problem: Education: Goal: Knowledge of General Education information will improve Description: Including pain rating scale, medication(s)/side effects and non-pharmacologic comfort measures Outcome: Progressing   Problem: Clinical Measurements: Goal: Ability to maintain clinical measurements within normal limits will improve Outcome: Progressing   Problem: Clinical Measurements: Goal: Will remain free from infection Outcome: Progressing   Problem: Clinical Measurements: Goal: Will remain free from infection Outcome: Progressing   Problem: Clinical Measurements: Goal: Diagnostic test results will improve Outcome: Progressing   Problem: Clinical Measurements: Goal: Respiratory complications will improve Outcome: Progressing   Problem: Coping: Goal: Level of anxiety will decrease Outcome: Progressing   Problem: Safety: Goal: Ability to remain free from injury will improve Outcome: Progressing   Problem: Skin Integrity: Goal: Risk for impaired skin integrity will decrease Outcome: Progressing

## 2024-07-06 NOTE — Consult Note (Signed)
 [facility]    Date of Admission:  07/01/2024     Reason for Consult: abd wall/mesh infection    Referring Provider: Elspeth Schultze     Lines:  Peripheral iv's  Abx: 8/21-c vanc 8/21-c cefepime         Assessment: 73 yo male with htn, HFpEF, CAD, DM2, PE on eliquis , prostate cancer, bladder cancer, hx cytoprostatectomy and ileal conduit in 2013 and ?chronic bilateral nephrostomy tubes, recent complicated surgical course as below starting 04/10/24 for parastomal/incisional abd wall hernia repair, ultimately with abd wall mesh replacement, end ileostomy, complicated by intra-abd and abd wall abscess from bowel perforation for which ID consulted   I reviewed chart and discussed with Dr Schultze. Per dr Schultze, the patient after 04/10/24 surgery had ileal conduit/urine leak and from note bowel perforation with both intraabd/abd wall abscess.   He was seen by my partner dr Dea 04/29/24 and had piptazo and micafungin ->fluconazole  for the intraabd/abd wall abscess with tentative EOT 05/18/24 (4 weeks)   He was discharged to CIR but readmitted 8/20 for new purulence output from the abd wall drain right side  He had improved drainage from the abd wall drain prior to this admission and the drain was removed  Ct scan showed the intraabd abscess had resolved by 8//6/25 but remaining abd wall abscess    From culture review and impression from dr Schultze, the urine culture I agree likely is not playing a role int he abd wall abscess. I agree to focus on the culture growing from abd wall drain. Only ecoli and corynebacterium growing after stopping piptazo/micafungin  7/07  He has had aki which is improving as well this admission in setting neprhostomy tubes exchange   Procedures: 5/30 parastomal and incisional incarcerated abd wall hernia --> LOA; parastomal hernia repair with mesh; urostomy ileal conduit revision 6/09 perforated bowel --> drainage of right  abd wall abscess and intraabd abscess; explantation of mesh; small bowel resection in continuity; placement of wound vac 6/10 delayed bowel perforation with open abd --> LOA; abd wall I&D; ileal resection; end ileostomy; abd wall parastomal/incisional hernia repair with phasix mesh (dissolvable in 1 year); fascia closure and wound vac placement 6/23 bilateral nephrostomy tube placement 6/30 bilateral nephrostomy tube exchange 7/02 right ureteral stent placement     Current Admission Cx: 8/20 blood cx negative  8/21 Jp drain from the abd wall cx - Ecoli (R amp, cefazolin , cipro , bactrim ; I amp/sulb; S ceftriaxone , piptazo), Corynebacterium species  8/21 urine cx mrsa (S tetra; R bactrim ), e faecium (V sensitive)  Previous admission cx: 7/31 ucx citrobacter freundii and kleb pnae 04/21/24 tissue intra-abd necrosis e coli (R amp, amp-sulb; cefazolin  not tested), c albican, e faecalis, bacteroides species   Plan: Continue vanc Change cefepime  to ceftriaxone  Will place picc line Plan 6 weeks of iv abx then transition to oral suppression Oral antibiotics transition will be difficult for both the coryne and the ecoli given relatively lack of oral option Omadocycline testing to be sent out for ecoli Plan to do dalbavancin 2 more doses after the 6 weeks to complete 3 months of corynebacterium coverage then go to linezolid  600 mg once a day suppression for corynebacterium, along with omadocycline for ecoli if susceptible Plan probably total 6 months antibiotics. 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 male with complicated medical/surgical course (see above a&p for detail), id consulted for comanagement of abd wall/mesh infection  Reviewed chart/discussed with patient and primary surgical team  Recently treated for intraabd abscess/abd wall abscess complication from leaked urine/perfed bowel. This was early 04/2024  Sent to CIR but returned 8/22 for purulence output from the right abd wall abscess drain  Last abx 7/07 from prior id recs (piptazo/micafungin ). Brief 5 days piptazo again 7/11-16 for concern of cellulitis at parastomal area  F/u ct abd/pelv on 8/6 showed the intraabd abscess had resolved  No fever, chill  On transfer back from cir this admission had aki and leukocytosis but afebrile Repeat ct this admission showed improved abd wall abscess size but severe bilateral hydronephrosis  Urology following and IR had replaced bilateral nephrostomy tube. There is also a right ureteral stent  His aki had improved since nephrostomy tube exchanged  Cx grew ecoli from the abd wall abscess drained. Drain had been removed  Ucx mrsa/e faecium  On vanc/cefepime  this admission   No other complaint    Family History  Problem Relation Age of Onset   Lung cancer Mother    Hypertension Father    Colon cancer Other    CAD Neg Hx    Diabetes Neg Hx    Stroke Neg Hx     Social History   Tobacco Use   Smoking status: Former    Current packs/day: 0.00    Types: Cigarettes    Start date: 12/24/1970    Quit date: 12/25/1979    Years since quitting: 44.5   Smokeless tobacco: Never  Vaping Use   Vaping status: Never Used  Substance Use Topics    Alcohol  use: Yes    Comment: rare   Drug use: No    Allergies  Allergen Reactions   Demerol  [Meperidine ] Nausea And Vomiting    Review of Systems: ROS All Other ROS was negative, except mentioned above   Past Medical History:  Diagnosis Date   At risk for sleep apnea    12-25-2017   STOP-BANG SCORE= 5   --- SENT TO PCP   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   moderate atypia - right low back   Atypical nevus 04/04/2007   moderate to marked - right upper back (wider shave)   Atypical nevus 04/04/2007   moderate atypia - center chest (wider shave)   Atypical nevus 04/04/2007   slight atypia - right thigh   Atypical nevus 11/29/2011   mild atypia - center upper back   Atypical nevus 11/29/2011   mild atypia - center chest   Bacteremia due to Klebsiella pneumoniae 10/09/2017   Bladder cancer (HCC) dx 07/2017   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  s/p  cystectomy w/ ileal conduit urinary diversion   Candida infection    CHF (congestive heart failure) (HCC)    Colostomy in place [facility])    since 08-08-2017-- per pt 12-25-2017 reddness  around stoma   Diabetes mellitus without complication (HCC)    GERD (gastroesophageal reflux disease)    H/O hiatal hernia    History of sepsis 09/2017   dx bacteremia due to klebsiella pneumoniae,  post op intraabdominal abscess   Prostate cancer [facility]) urologist-- dr renda   10-02-2012 s/p  prostatectomy-- Stage T1c   RBBB    Renal disorder    pt. denies   Sleep apnea    cpap   Squamous cell carcinoma of skin 05/22/2013   left cheek - CX3 + 5FU   Wears glasses        Scheduled Meds:  apixaban   5 mg Oral BID   Chlorhexidine  Gluconate Cloth  6 each Topical Daily   cyanocobalamin   500 mcg Oral Q M,W,F   cyproheptadine   4 mg Oral QHS   feeding supplement  237 mL Oral BID BM   fludrocortisone   0.2 mg Oral Daily   Gerhardt's butt cream  1 Application Topical BID   leptospermum manuka honey  1 Application Topical Daily   loperamide   4 mg  Oral TID   melatonin  3 mg Oral QHS   metoCLOPramide   5 mg Oral TID AC   midodrine   15 mg Oral TID WC   multivitamin with minerals  1 tablet Oral Daily   nutrition supplement (JUVEN)  1 packet Oral BID BM   omega-3 acid ethyl esters  1 g Oral QODAY   mouth rinse  15 mL Mouth Rinse 4 times per day   psyllium  1 packet Oral Daily   rosuvastatin   10 mg Oral QPM   sertraline   100 mg Oral Daily   Continuous Infusions:  ceFEPime  (MAXIPIME ) IV 2 g (07/06/24 1549)   potassium chloride      vancomycin  1,250 mg (07/06/24 1650)   PRN Meds:.acetaminophen , alum & mag hydroxide-simeth, cyclobenzaprine , hydrOXYzine , ipratropium, metoCLOPramide  (REGLAN ) injection, mouth rinse, oxyCODONE -acetaminophen , potassium chloride , prochlorperazine , sodium chloride    OBJECTIVE: Blood pressure 113/60, pulse 67, temperature 97.8 F (36.6 C), temperature source Oral, resp. rate 18, height 5' 11 (1.803 m), weight 90 kg, SpO2 99%.  Physical Exam  General/constitutional: no distress, pleasant HEENT: Normocephalic, PER, Conj Clear, EOMI, Oropharynx clear Neck supple CV: rrr no mrg Lungs: clear to auscultation, normal respiratory effort Abd: Soft, mild tenderness right lower abd wall; midline incision almost healed; ileostomy with soft stool; urostomy with clear urine Gu: bilateral nephrostomy tube in place Ext: no edema Skin: No Rash Neuro: nonfocal MSK: no peripheral joint swelling/tenderness/warmth; back spines nontender   Central line presence: no   Lab Results Lab Results  Component Value Date   WBC 7.5 07/06/2024   HGB 10.3 (L) 07/06/2024   HCT 31.7 (L) 07/06/2024   MCV 84.3 07/06/2024   PLT 221 07/06/2024    Lab Results  Component Value Date   CREATININE 1.37 (H) 07/06/2024   BUN 46 (H) 07/06/2024   NA 133 (L) 07/06/2024   K 3.8 07/06/2024   CL 108 07/06/2024   CO2 19 (L) 07/06/2024    Lab Results  Component Value Date   ALT 15 07/06/2024   AST 14 (L) 07/06/2024   ALKPHOS 60  07/06/2024   BILITOT 0.4 07/06/2024      Microbiology: Recent Results (from the past 240 hours)  Culture, blood (Routine x 2)     Status: None   Collection Time: 07/01/24  5:42 PM   Specimen: BLOOD  Result Value Ref Range Status   Specimen Description BLOOD LEFT ANTECUBITAL  Final  Special Requests   Final    BOTTLES DRAWN AEROBIC AND ANAEROBIC Blood Culture results may not be optimal due to an inadequate volume of blood received in culture bottles   Culture   Final    NO GROWTH 5 DAYS Performed at [facility], [address], [place], [place] [address]    Report Status 07/06/2024 FINAL  Final  Culture, blood (Routine x 2)     Status: None   Collection Time: 07/01/24  6:01 PM   Specimen: BLOOD RIGHT HAND  Result Value Ref Range Status   Specimen Description BLOOD RIGHT HAND  Final   Special Requests   Final    BOTTLES DRAWN AEROBIC AND ANAEROBIC Blood Culture results may not be optimal due to an inadequate volume of blood received in culture bottles   Culture   Final    NO GROWTH 5 DAYS Performed at [facility], [address], [place], [place] [address]    Report Status 07/06/2024 FINAL  Final  Urine Culture     Status: Abnormal   Collection Time: 07/01/24 10:45 PM   Specimen: Urine, Random  Result Value Ref Range Status   Specimen Description URINE, RANDOM  Final   Special Requests   Final    NONE Reflexed from [id number] Performed at [facility], [address] [place], [place] [address]    Culture (A)  Final    >=100,000 COLONIES/mL METHICILLIN RESISTANT STAPHYLOCOCCUS AUREUS >=100,000 COLONIES/mL ENTEROCOCCUS FAECIUM    Report Status 07/04/2024 FINAL  Final   Organism ID, Bacteria METHICILLIN RESISTANT STAPHYLOCOCCUS AUREUS (A)  Final   Organism ID, Bacteria ENTEROCOCCUS FAECIUM (A)  Final      Susceptibility   Enterococcus faecium - MIC*    AMPICILLIN >=32 RESISTANT Resistant     NITROFURANTOIN 64 INTERMEDIATE Intermediate     VANCOMYCIN   <=0.5 SENSITIVE Sensitive     * >=100,000 COLONIES/mL ENTEROCOCCUS FAECIUM   Methicillin resistant staphylococcus aureus - MIC*    CIPROFLOXACIN  >=8 RESISTANT Resistant     GENTAMICIN  <=0.5 SENSITIVE Sensitive     NITROFURANTOIN <=16 SENSITIVE Sensitive     OXACILLIN >=4 RESISTANT Resistant     TETRACYCLINE <=1 SENSITIVE Sensitive     VANCOMYCIN  1 SENSITIVE Sensitive     TRIMETH /SULFA  >=320 RESISTANT Resistant     RIFAMPIN <=0.5 SENSITIVE Sensitive     Inducible Clindamycin  NEGATIVE Sensitive     LINEZOLID  2 SENSITIVE Sensitive     * >=100,000 COLONIES/mL METHICILLIN RESISTANT STAPHYLOCOCCUS AUREUS  Body fluid culture w Gram Stain     Status: None   Collection Time: 07/02/24 12:01 PM   Specimen: JP Drain; Body Fluid  Result Value Ref Range Status   Specimen Description JP DRAINAGE  Final   Special Requests   Final    19FR BLAKE DRAIN IN RIGHT LOWER QUADRANT OF ABDOMEN   Gram Stain   Final    ABUNDANT WBC PRESENT, PREDOMINANTLY PMN GRAM NEGATIVE RODS ABUNDANT GRAM POSITIVE RODS    Culture   Final    ABUNDANT ESCHERICHIA COLI ABUNDANT DIPHTHEROIDS(CORYNEBACTERIUM SPECIES) Standardized susceptibility testing for this organism is not available. Performed at [facility], [address], [place], [place] [address]    Report Status 07/04/2024 FINAL  Final   Organism ID, Bacteria ESCHERICHIA COLI  Final      Susceptibility   Escherichia coli - MIC*    AMPICILLIN >=32 RESISTANT Resistant     CEFAZOLIN  (NON-URINE) 8 RESISTANT Resistant  CEFEPIME  <=0.12 SENSITIVE Sensitive     ERTAPENEM <=0.12 SENSITIVE Sensitive     CEFTRIAXONE  <=0.25 SENSITIVE Sensitive     CIPROFLOXACIN  >=4 RESISTANT Resistant     GENTAMICIN  <=1 SENSITIVE Sensitive     MEROPENEM <=0.25 SENSITIVE Sensitive     TRIMETH /SULFA  >=320 RESISTANT Resistant     AMPICILLIN/SULBACTAM 16 INTERMEDIATE Intermediate     PIP/TAZO Value in next row Sensitive ug/mL     <=4 SENSITIVEThis is a modified FDA-approved test  that has been validated and its performance characteristics determined by the reporting laboratory.  This laboratory is certified under the Clinical Laboratory Improvement Amendments CLIA as qualified to perform high complexity clinical laboratory testing.    * ABUNDANT ESCHERICHIA COLI     Serology:    Imaging: If present, new imagings (plain films, ct scans, and mri) have been personally visualized and interpreted; radiology reports have been reviewed. Decision making incorporated into the Impression / Recommendations.  8/21 ct abd pelv without contrast 1. Interval development of moderate to severe left hydronephrosis and hydroureter, ureter dilated down to the ileal conduit, stricture was demonstrated on nephrostogram 05/11/2024. Slight increased left perinephric stranding with mild stranding about the now dilated left ureter, correlate for superimposed infection. Correlate also for function of the left percutaneous nephrostomy tube. 2. Bilateral percutaneous nephrostomy catheters remain in place. Right-sided ureteral stent, extending from the renal pelvis to the right abdominal urostomy. Residual mild to moderate right-sided hydronephrosis but slightly diminished compared to most recent prior CT. 3. Interval nodular airspace disease and ground-glass density in the left greater than right lung bases, suspect for pneumonia and or aspiration. 4. Drainage catheter in the subcutaneous soft tissues of the right abdominal wall near the urostomy. Decreased fluid collection within the right ventral pelvic wall, limited characterization without contrast. 5. Aortic atherosclerosis.   8/06 abd pelv ct wwo contrast 1. 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 2. Enlarging fluid collection along the right ventral pelvic abdominal wall with a percutaneous drain in place, indicative of an abscess. 3. Possible ileal conduit stenosis, as it  traverses the abdominal wall, with moderate to severe right hydronephrosis, as before. 4. Right lower quadrant ileostomy. 5. Probable small right renal stone. 6. Chronic calcific pancreatitis.   7/1 ct abd pelv wo contrast 1. 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 There then is another segment of stenosis right at the abdominal wall and extending just into the peritoneal cavity where there may be a very subtle pinpoint leak extending laterally over approximately 2 cm and paralleling just superior to one of the surgical drains that originates in the fall left lateral abdomen and ultimately crosses over into the far right lateral lower abdomen. 2. There is an additional more obvious leak along the inferior aspect of the conduit beginning as a thin column of extravasated injected contrast measuring approximately 4-5 mm in width and extending into the right lower abdominal wall at the level of one of the transversely oriented surgical drains. 3. Bilateral percutaneous nephrostomy tubes are present with pigtail portions formed in the renal pelvis bilaterally. There is some distension of the right renal collecting system with contrast due to free reflux of contrast injected into the ileal conduit with contrast opacification of the entire right ureter and renal collecting system. Only a tiny segment of the distal left ureter is visualized with clear stenosis of the  distal left ureter seen and thickening of the distal left ureter. This stenosis was characterized by nephrostogram yesterday. 4. Edema of the right lower muscle wall and some ill-defined fluid in the right lower abdominal wall which appears stable since the prior CT.     Constance ONEIDA Passer, MD [facility] [phone] pager    07/06/2024, 9:50 PM

## 2024-07-06 NOTE — Progress Notes (Addendum)
 07/06/2024  Kristopher Thompson [id number] January 23, 1951  CARE TEAM: PCP: [name], MD  Outpatient Care Team: Patient Care Team: [name], MD as PCP - General (Internal Medicine) [name], MD as Consulting Physician (Dermatology) [name], MD as Referring Physician (Pulmonary Disease) [name], MD as Consulting Physician (General Surgery) [name], MD as Consulting Physician (Urology)  Inpatient Treatment Team: Treatment Team:  Arlon Carliss LELON ROSALEA [name], MD Bobbette Cedar, MD [name], MD Prater, Lenoard NOVAK, PT Myrna Hughie BIRCH, RN Elaine Verdel SAILOR, RN [name], [place] Genaro Zebedee BIRCH, [facility] [name], RN   Problem List:   Principal Problem:   AKI (acute kidney injury) [facility]) Active Problems:   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 08/08/2017   S/P ileal conduit (HCC)   GERD (gastroesophageal reflux disease)   Sepsis (HCC)   Bilateral hydronephrosis   Sepsis due to undetermined organism [facility])   Chronic anticoagulation   Obstructive sleep apnea of adult   Delayed bowel perforation s/p SBR/end ileostomy   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   Adjustment disorder with depressed mood   Hyponatremia   04/10/2024  POST-OPERATIVE DIAGNOSIS:  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   PROCEDURE:   -ROBOTIC LYSIS OF ADHESIONS X 4 HOURS -COMPONENT SEPARATION - TRANSVERSUS ABDOMINIS REALEASE (TAR) BILATERAL -ROBOTIC REPAIRS OF  INCISIONAL, PARASTOMAL, LEFT INGUINAL  INCARCERATED ABDOMINAL WALL HERNIAS WITH MESH -UROSTOMY ILEAL CONDUIT REVISION -INTRAOPERATIVE ASSESSMENT OF TISSUE VASCULAR PERFUSION USING ICG (indocyanine green ) -IMMUNOFLUORESCENCE -TRANSVERSUS ABDOMINIS PLANE (TAP) BLOCK - BILATERAL    SURGEON:  [name], MD   04/20/2024 POST-OPERATIVE DIAGNOSIS:  perforated bowel   PROCEDURE:  Drainage of right abdominal wall abscess as  well as intra-abdominal abscess Explantation of abdominal mesh Small bowel resection (in discontinuity) Placement of ABThera wound VAC   SURGEON:  [name], MD  04/21/2024  POST-OPERATIVE DIAGNOSIS:  DELAYED BOWEL PERFORATION WITH OPEN ABDOMEN   PROCEDURE:   LYSIS OF ADHESIONS X ABDOMINAL WALL DEBRIDEMENT ILEAL RESECTION END ILEOSTOMY ABDOMINAL WALL PARASTOMAL & INCISIONAL HERNIA REPAIRS WITH PHASIX MESH FASCIA CLOSURE WITH WOUND VAC PLACEMENT IN SQ   SURGEON:  [name], MD    05/04/2024 Interventional Radiology Procedure Note   Procedure: Bilateral percutaneous nephrostomy tube placements   Findings: Please refer to procedural dictation for full description. 10 Fr bilateral to bag drainage.   Complications: None immediate   Estimated Blood Loss: < 5 ml   Recommendations: Keep to bag drainage.  IR will follow.  Ultimate management as per Urology.   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  Adjusted 6/30, 7/1, 7/3 with stent going from kidney out urostomy across the presumed mid ileal on the right side leak.  Abdominal wall Blake drain creatinine in normal serous range since 05/21/2024.  Repeat 8/21 with Cr 3 = consistent with serum at the time  Urine culture growing MRSA and Enterococcus faecium, somewhat drug-resistant.  Most likely this is chronic colonization/contamination but will defer to urology and infectious disease to see if further treatment needed.  Nephrostomy tubes &  stenting per [name] Urology.  Looks like the right nephrostomy tube is not draining much consistent with excellent drainage down that side but left side still poor.  Defer to urology if need to reposition stenting given hydronephrosis on the left side.    Another contributor to recurrent AKI could be from ileostomy, but seem less likely -Ileostomy output had been stable on fiber/iron /loperamide  twice daily.   -Patient no longer anemic = stopped iron .  -Continue fiber supplement to thicken effluent.  Patient prefers psyllium over FiberCon -Since watery effluent out of right upper quadrant fecal ileostomy, increase loperamide  to 4 mg every 8 hours and see if that helps.   -Strict I's and O's. - See if internal medicine/nephrology can wean off IV fluids and the patient can hydrate on his own.  Infection due to delayed bowel perforation and abdominal wall infection/necrosis  Abdominal wall drain in abdominal wall catching ileal conduit delayed urine leak -No urine leak as of 7/10.  -Nephrostomy tubes capped 2 weeks ago with still low output x 2 weeks -Plan for removal in office last week but then readmitted prior to office visit. -Drainage became purulent a few days prior to admission.   -Drain removed 8/21.   -Had discharge from drain site for the first few days, but it is now sealed up.   - Abdominal wall with minimal redness and markedly improved overall -Culture growing E. coli.  I suspect the patient would benefit from prolonged antibiotics.  He does not have a permanent mesh but the Phasix mesh takes at least a year to resolve = may benefit from more prolonged antibiotics for at least 2 weeks up to 6 weeks.  I asked Dr. Overton with infectious disease to consult for an outpatient antibiotic plan since I think he is close to getting discharged  Since he has clinically improved, I am guarded at repeating a CAT scan at this time.  See what infectious disease thinks.   I am a little  skeptical that placing another drain in the fluid collection will control things at this point.  The last drain was in excellent position and had minimal output for 2 weeks.   I think I would like to keep plastic out of there for a while and regroup and see if he gets better.    If he has worsening pain swelling or recurrent drainage, elevated white count, fevers; then, repeat CAT scan to see if there is a focal fluid collection that can be drained.  Try and hold off for now.    Nutrition:  PO Diet as tolerated.  Regular diet.  Still on a soft diet.  Try and readvanced.  His oral intake effort has been poor but his nutrition numbers are low normal at least   His severe malnutrition is slowly been improving.  I suspect he is just mildly malnourished.  Prealbumin of 24 relatively stable from 2 weeks ago a guardedly hopeful sign.   History of growing multiple organisms including Candida albicans, Enterobacter, and Enterococcus.    IV Zosyn /micafungin  x6 weeks, completed 7/7.    Placed on preemptive antibiotics for now.  Send abdominal drain fluid for culture.  Most likely will be contaminants but clearly abnormal where is it has been serous for 2 months.  Then I think we should pull the drain since I worry it is causing more a pathway to infection than help.  See what that shows..  Midline incision wound  Wound very superficial now with good granulation.   May place dry dressing over it and change once a day.    Metabolic encephalopathy resolved.  Follow.  Insomnia improved  ABLA on top of anemia of chronic disease nearly normal at this point.  Hold off on extra supplementation     -monitor electrolytes & replace as needed.  Keep K>4, Mg>2, Phos>3.    -Diabetes.  Sliding scale insulin .    -VTE prophylaxis-  Full anticoagulation given history of pulmonary embolism.  Eliquis .  -Mobilize as tolerated.  He remains very deconditioned and low motivation to exercise.  Hopefully  therapies can work with him more to get him stronger.  Wife strongly agrees  I updated the patient's status to the the patient's wife and nurse.  Recommendations were made.  Questions were answered.  They expressed understanding & appreciation.  -Disposition:    I reviewed nursing notes, last 24 h vitals and pain scores, last 48 h intake and output, last 24 h labs and trends, and last 24 h imaging results.  I have reviewed this patient's available data, including medical history, events of note, test results, etc as part of my evaluation.   A significant portion of that time was spent in counseling. Care during the described time interval was provided by me.  This care required high  level of medical decision making.  07/06/2024    Subjective: (Chief complaint)   Patient up in chair.  Wife in room.  Old drain site drained some purulence on Saturday but has had minimal drainage in the past day.  Patient tolerating soft food.  Denies much nausea.   Objective:  Vital signs:  Vitals:   07/05/24 2259 07/06/24 0500 07/06/24 0700 07/06/24 0747  BP: (!) 94/57  (!) 86/62 (!) 90/57  Pulse: (!) 58 (!) 59    Resp: 18 19    Temp: 98.4 F (36.9 C) 97.7 F (36.5 C)  98.2 F (36.8 C)  TempSrc: Axillary Axillary  Oral  SpO2: 98%   97%  Weight:  90 kg    Height:        Last BM Date : 07/03/24  Intake/Output   Yesterday:  08/24 0701 - 08/25 0700 In: 1730.6 [P.O.:50; I.V.:861.5; IV Piggyback:419.1] Out: 2130 [Urine:1480; Stool:200] This shift:  No intake/output data recorded.  Bowel function:  Flatus: YES  BM:  YES  Drains:  RLQ surgical Blake drain to biliary gravity bag drain with 10 felt scant drainage in tubing.  R & L back nephrostomy tubes in place.  -Tan orange thin effluent in both nephrostomy tubes and ileal conduit   Physical Exam:  General: Resting in no acute distress.  More alert and interactive.  Oriented x 4 Eyes: Glasses PERRL, normal EOM.  Sclera  clear.  No icterus Neuro: CN II-XII intact w/o focal sensory/motor deficits. Lymph: No head/neck/groin lymphadenopathy Psych:  No delerium/psychosis/paranoia.  No agitation HENT: Normocephalic, Mucus membranes moist.  No thrush.  Moderately hard of hearing Neck: Supple, No tracheal deviation.  No obvious thyromegaly Chest: No pain to chest wall compression.  Good respiratory excursion.   CV:  Pulses intact.  Regular rhythm.  No extremity edema MS:  No obvious deformity  Abdomen:  Obese Soft.  Nondistended.  No tenderness nor guarding. Midline abd wound 10 x 2-4cm wide.  superifical w good granulation Right upper quadrant Ileostomy with pasty effluent Right lower quadrant ileal conduit urostomy with urine in place Right nephrostomy tube into biliary bag with scant urine  Left nephrostomy tube into biliary bag with moderate urine  Right lower quadrant panniculus with drain site closed.  No purulence expressed.  Minimal pink skin changes.  No peau d'orange erythema redness or ecchymosis.  No cellulitis or crepitus.  Markedly improved   Ext:   No deformity.  No edema.  No cyanosis Skin: No petechiae / purpurea.  No major sores.  Warm and dry    Results:   Cultures: Recent Results (from the past 720 hours)  Urine Culture (for pregnant, neutropenic or urologic patients or patients with an indwelling urinary catheter)     Status: Abnormal   Collection Time: 06/11/24  3:13 PM   Specimen: Urine, Bag (ped)  Result Value Ref Range Status   Specimen Description URINE, BAG PED  Final   Special Requests   Final    NONE Performed at [facility], [address], [place], [place] [address]    Culture (A)  Final    >=100,000 COLONIES/mL KLEBSIELLA PNEUMONIAE 50,000 COLONIES/mL CITROBACTER FREUNDII    Report Status 06/15/2024 FINAL  Final   Organism ID, Bacteria KLEBSIELLA PNEUMONIAE (A)  Final   Organism ID, Bacteria CITROBACTER FREUNDII (A)  Final      Susceptibility   Citrobacter  freundii - MIC*    CEFEPIME  <=0.12 SENSITIVE Sensitive     CEFTAZIDIME <=1 SENSITIVE Sensitive     CEFTRIAXONE  <=0.25 SENSITIVE Sensitive     CIPROFLOXACIN  <=0.25 SENSITIVE Sensitive     GENTAMICIN  <=1 SENSITIVE Sensitive     IMIPENEM 1 SENSITIVE Sensitive     TRIMETH /SULFA  <=20 SENSITIVE Sensitive     PIP/TAZO <=4 SENSITIVE Sensitive ug/mL    * 50,000 COLONIES/mL CITROBACTER FREUNDII   Klebsiella pneumoniae - MIC*    AMPICILLIN >=32 RESISTANT Resistant     CEFEPIME  <=0.12 SENSITIVE Sensitive     CEFTAZIDIME <=1 SENSITIVE Sensitive     CEFTRIAXONE  <=0.25 SENSITIVE Sensitive     CIPROFLOXACIN  <=0.25 SENSITIVE Sensitive     GENTAMICIN  <=1 SENSITIVE Sensitive     IMIPENEM <=0.25 SENSITIVE Sensitive     TRIMETH /SULFA  <=20 SENSITIVE Sensitive     AMPICILLIN/SULBACTAM 8 SENSITIVE Sensitive     PIP/TAZO <=4 SENSITIVE Sensitive ug/mL    * >=100,000 COLONIES/mL KLEBSIELLA PNEUMONIAE  Culture, blood (Routine x 2)     Status: None (Preliminary result)   Collection Time: 07/01/24  5:42 PM   Specimen: BLOOD  Result Value Ref Range Status   Specimen Description BLOOD LEFT ANTECUBITAL  Final   Special Requests   Final    BOTTLES DRAWN AEROBIC AND ANAEROBIC Blood Culture results may not be optimal due to an inadequate volume of blood received in culture bottles   Culture   Final    NO GROWTH 4 DAYS Performed at [facility], [address], [place], [place] [address]    Report Status PENDING  Incomplete  Culture, blood (Routine x 2)     Status: None (Preliminary result)   Collection Time: 07/01/24  6:01 PM   Specimen: BLOOD RIGHT HAND  Result Value Ref Range Status   Specimen Description BLOOD RIGHT HAND  Final   Special Requests   Final    BOTTLES DRAWN AEROBIC AND ANAEROBIC Blood Culture results may not be optimal due to an inadequate volume of blood received in culture bottles   Culture   Final    NO GROWTH 4 DAYS Performed at [facility], [address] [place],  [place] [address]    Report Status  PENDING  Incomplete  Urine Culture     Status: Abnormal   Collection Time: 07/01/24 10:45 PM   Specimen: Urine, Random  Result Value Ref Range Status   Specimen Description URINE, RANDOM  Final   Special Requests   Final    NONE Reflexed from [id number] Performed at [facility], [address] [place], [place] [address]    Culture (A)  Final    >=100,000 COLONIES/mL METHICILLIN RESISTANT STAPHYLOCOCCUS AUREUS >=100,000 COLONIES/mL ENTEROCOCCUS FAECIUM    Report Status 07/04/2024 FINAL  Final   Organism ID, Bacteria METHICILLIN RESISTANT STAPHYLOCOCCUS AUREUS (A)  Final   Organism ID, Bacteria ENTEROCOCCUS FAECIUM (A)  Final      Susceptibility   Enterococcus faecium - MIC*    AMPICILLIN >=32 RESISTANT Resistant     NITROFURANTOIN 64 INTERMEDIATE Intermediate     VANCOMYCIN  <=0.5 SENSITIVE Sensitive     * >=100,000 COLONIES/mL ENTEROCOCCUS FAECIUM   Methicillin resistant staphylococcus aureus - MIC*    CIPROFLOXACIN  >=8 RESISTANT Resistant     GENTAMICIN  <=0.5 SENSITIVE Sensitive     NITROFURANTOIN <=16 SENSITIVE Sensitive     OXACILLIN >=4 RESISTANT Resistant     TETRACYCLINE <=1 SENSITIVE Sensitive     VANCOMYCIN  1 SENSITIVE Sensitive     TRIMETH /SULFA  >=320 RESISTANT Resistant     RIFAMPIN <=0.5 SENSITIVE Sensitive     Inducible Clindamycin  NEGATIVE Sensitive     LINEZOLID  2 SENSITIVE Sensitive     * >=100,000 COLONIES/mL METHICILLIN RESISTANT STAPHYLOCOCCUS AUREUS  Body fluid culture w Gram Stain     Status: None   Collection Time: 07/02/24 12:01 PM   Specimen: JP Drain; Body Fluid  Result Value Ref Range Status   Specimen Description JP DRAINAGE  Final   Special Requests   Final    19FR BLAKE DRAIN IN RIGHT LOWER QUADRANT OF ABDOMEN   Gram Stain   Final    ABUNDANT WBC PRESENT, PREDOMINANTLY PMN GRAM NEGATIVE RODS ABUNDANT GRAM POSITIVE RODS    Culture   Final    ABUNDANT ESCHERICHIA COLI ABUNDANT DIPHTHEROIDS(CORYNEBACTERIUM  SPECIES) Standardized susceptibility testing for this organism is not available. Performed at [facility], [address] [place], [place] [address]    Report Status 07/04/2024 FINAL  Final   Organism ID, Bacteria ESCHERICHIA COLI  Final      Susceptibility   Escherichia coli - MIC*    AMPICILLIN >=32 RESISTANT Resistant     CEFAZOLIN  (NON-URINE) 8 RESISTANT Resistant     CEFEPIME  <=0.12 SENSITIVE Sensitive     ERTAPENEM <=0.12 SENSITIVE Sensitive     CEFTRIAXONE  <=0.25 SENSITIVE Sensitive     CIPROFLOXACIN  >=4 RESISTANT Resistant     GENTAMICIN  <=1 SENSITIVE Sensitive     MEROPENEM <=0.25 SENSITIVE Sensitive     TRIMETH /SULFA  >=320 RESISTANT Resistant     AMPICILLIN/SULBACTAM 16 INTERMEDIATE Intermediate     PIP/TAZO Value in next row Sensitive ug/mL     <=4 SENSITIVEThis is a modified FDA-approved test that has been validated and its performance characteristics determined by the reporting laboratory.  This laboratory is certified under the Clinical Laboratory Improvement Amendments CLIA as qualified to perform high complexity clinical laboratory testing.    * ABUNDANT ESCHERICHIA COLI     Labs: Results for orders placed or performed during the hospital encounter of 07/01/24 (from the past 48 hours)  CBC     Status: Abnormal   Collection Time: 07/05/24  2:39 AM  Result Value Ref Range   WBC 8.4 4.0 -  10.5 K/uL   RBC 4.20 (L) 4.22 - 5.81 MIL/uL   Hemoglobin 11.4 (L) 13.0 - 17.0 g/dL   HCT 64.5 (L) 60.9 - 47.9 %   MCV 84.3 80.0 - 100.0 fL   MCH 27.1 26.0 - 34.0 pg   MCHC 32.2 30.0 - 36.0 g/dL   RDW 86.1 88.4 - 84.4 %   Platelets 204 150 - 400 K/uL   nRBC 0.0 0.0 - 0.2 %    Comment: Performed at [facility], [address] [place], [place] [address]  Comprehensive metabolic panel with GFR     Status: Abnormal   Collection Time: 07/05/24  2:39 AM  Result Value Ref Range   Sodium 131 (L) 135 - 145 mmol/L   Potassium 4.2 3.5 - 5.1 mmol/L   Chloride 103 98 - 111  mmol/L   CO2 21 (L) 22 - 32 mmol/L   Glucose, Bld 88 70 - 99 mg/dL    Comment: Glucose reference range applies only to samples taken after fasting for at least 8 hours.   BUN 53 (H) 8 - 23 mg/dL   Creatinine, Ser 8.47 (H) 0.61 - 1.24 mg/dL   Calcium  9.4 8.9 - 10.3 mg/dL   Total Protein 5.4 (L) 6.5 - 8.1 g/dL   Albumin  2.0 (L) 3.5 - 5.0 g/dL   AST 14 (L) 15 - 41 U/L   ALT 16 0 - 44 U/L   Alkaline Phosphatase 68 38 - 126 U/L   Total Bilirubin 0.5 0.0 - 1.2 mg/dL   GFR, Estimated 48 (L) >60 mL/min    Comment: (NOTE) Calculated using the CKD-EPI Creatinine Equation (2021)    Anion gap 7 5 - 15    Comment: Performed at [facility], [address] [place], [place] [address]  Magnesium      Status: None   Collection Time: 07/05/24  2:39 AM  Result Value Ref Range   Magnesium  1.8 1.7 - 2.4 mg/dL    Comment: Performed at [facility], [address], [place], [place] [address]  Glucose, capillary     Status: None   Collection Time: 07/05/24  8:15 AM  Result Value Ref Range   Glucose-Capillary 92 70 - 99 mg/dL    Comment: Glucose reference range applies only to samples taken after fasting for at least 8 hours.  Lactic acid, plasma     Status: None   Collection Time: 07/05/24  9:42 PM  Result Value Ref Range   Lactic Acid, Venous 1.0 0.5 - 1.9 mmol/L    Comment: Performed at [facility], [address] [place], [place] [address]  CBC     Status: Abnormal   Collection Time: 07/06/24  2:59 AM  Result Value Ref Range   WBC 7.5 4.0 - 10.5 K/uL   RBC 3.76 (L) 4.22 - 5.81 MIL/uL   Hemoglobin 10.3 (L) 13.0 - 17.0 g/dL   HCT 68.2 (L) 60.9 - 47.9 %   MCV 84.3 80.0 - 100.0 fL   MCH 27.4 26.0 - 34.0 pg   MCHC 32.5 30.0 - 36.0 g/dL   RDW 85.8 88.4 - 84.4 %   Platelets 221 150 - 400 K/uL   nRBC 0.0 0.0 - 0.2 %    Comment: Performed at [facility], [address], [place], [place] [address]  Comprehensive metabolic panel with GFR     Status: Abnormal   Collection  Time: 07/06/24  2:59 AM  Result Value Ref  Range   Sodium 133 (L) 135 - 145 mmol/L   Potassium 3.8 3.5 - 5.1 mmol/L   Chloride 108 98 - 111 mmol/L   CO2 19 (L) 22 - 32 mmol/L   Glucose, Bld 91 70 - 99 mg/dL    Comment: Glucose reference range applies only to samples taken after fasting for at least 8 hours.   BUN 46 (H) 8 - 23 mg/dL   Creatinine, Ser 8.62 (H) 0.61 - 1.24 mg/dL   Calcium  9.5 8.9 - 10.3 mg/dL   Total Protein 5.0 (L) 6.5 - 8.1 g/dL   Albumin  1.7 (L) 3.5 - 5.0 g/dL   AST 14 (L) 15 - 41 U/L   ALT 15 0 - 44 U/L   Alkaline Phosphatase 60 38 - 126 U/L   Total Bilirubin 0.4 0.0 - 1.2 mg/dL   GFR, Estimated 55 (L) >60 mL/min    Comment: (NOTE) Calculated using the CKD-EPI Creatinine Equation (2021)    Anion gap 6 5 - 15    Comment: Performed at [facility], [address], [place], [place] [address]  Magnesium      Status: None   Collection Time: 07/06/24  2:59 AM  Result Value Ref Range   Magnesium  1.8 1.7 - 2.4 mg/dL    Comment: Performed at [facility], [address] [place], [place] [address]  Phosphorus     Status: Abnormal   Collection Time: 07/06/24  2:59 AM  Result Value Ref Range   Phosphorus 1.8 (L) 2.5 - 4.6 mg/dL    Comment: Performed at [facility], [address] [place], [place] [address]    Imaging / Studies: No results found.         Medications / Allergies: per chart  Antibiotics: Anti-infectives (From admission, onward)    Start     Dose/Rate Route Frequency Ordered Stop   07/03/24 1600  vancomycin  (VANCOCIN ) IVPB 1000 mg/200 mL premix        1,000 mg 200 mL/hr over 60 Minutes Intravenous Every 24 hours 07/03/24 1422     07/03/24 1600  ceFEPIme  (MAXIPIME ) 2 g in sodium chloride  0.9 % 100 mL IVPB        2 g 200 mL/hr over 30 Minutes Intravenous Every 12 hours 07/03/24 1423     07/03/24 1345  linezolid  (ZYVOX ) IVPB 600 mg  Status:  Discontinued        600 mg 300 mL/hr over 60 Minutes Intravenous Every 12 hours  07/03/24 1252 07/03/24 1306   07/02/24 1800  ceFEPIme  (MAXIPIME ) 2 g in sodium chloride  0.9 % 100 mL IVPB  Status:  Discontinued        2 g 200 mL/hr over 30 Minutes Intravenous Every 24 hours 07/02/24 0323 07/03/24 1423   07/02/24 0345  vancomycin  (VANCOREADY) IVPB 2000 mg/400 mL        2,000 mg 200 mL/hr over 120 Minutes Intravenous  Once 07/02/24 0324 07/02/24 0600   07/01/24 2030  ceFEPIme  (MAXIPIME ) 2 g in sodium chloride  0.9 % 100 mL IVPB        2 g 200 mL/hr over 30 Minutes Intravenous  Once 07/01/24 2020 07/01/24 2308         Note: Portions of this report may have been transcribed using voice recognition software. Every effort was made to ensure accuracy; however, inadvertent computerized transcription errors may be present.   Any transcriptional errors that result from this process are unintentional.    Elspeth C.  Shaneta Cervenka, MD, FACS, MASCRS Esophageal, Gastrointestinal & Colorectal Surgery Robotic and Minimally Invasive Surgery  [facility] [address] [place], [place] [address] [phone] Fax [phone] Main  CONTACT INFORMATION: Weekday (9AM-5PM): Call [facility] main office at [phone] Weeknight (5PM-9AM) or Weekend/Holiday: Check EPIC Web Links tab & use AMION (password  [email or web address]) for General Surgery [facility] coverage  Please, DO NOT use SecureChat  (it is not reliable communication to reach operating surgeons & will lead to a delay in care).   Epic staff messaging available for outptient concerns needing 1-2 business day response.      07/06/2024  8:22 AM

## 2024-07-07 DIAGNOSIS — N179 Acute kidney failure, unspecified: Secondary | ICD-10-CM | POA: Diagnosis not present

## 2024-07-07 DIAGNOSIS — A419 Sepsis, unspecified organism: Secondary | ICD-10-CM | POA: Diagnosis not present

## 2024-07-07 DIAGNOSIS — N1330 Unspecified hydronephrosis: Secondary | ICD-10-CM | POA: Diagnosis not present

## 2024-07-07 DIAGNOSIS — Z7901 Long term (current) use of anticoagulants: Secondary | ICD-10-CM | POA: Diagnosis not present

## 2024-07-07 LAB — CBC
HCT: 33 % — ABNORMAL LOW (ref 39.0–52.0)
Hemoglobin: 10.5 g/dL — ABNORMAL LOW (ref 13.0–17.0)
MCH: 27.3 pg (ref 26.0–34.0)
MCHC: 31.8 g/dL (ref 30.0–36.0)
MCV: 85.9 fL (ref 80.0–100.0)
Platelets: 214 K/uL (ref 150–400)
RBC: 3.84 MIL/uL — ABNORMAL LOW (ref 4.22–5.81)
RDW: 14.3 % (ref 11.5–15.5)
WBC: 9.1 K/uL (ref 4.0–10.5)
nRBC: 0 % (ref 0.0–0.2)

## 2024-07-07 LAB — COMPREHENSIVE METABOLIC PANEL WITH GFR
ALT: 18 U/L (ref 0–44)
AST: 18 U/L (ref 15–41)
Albumin: 1.6 g/dL — ABNORMAL LOW (ref 3.5–5.0)
Alkaline Phosphatase: 60 U/L (ref 38–126)
Anion gap: 8 (ref 5–15)
BUN: 42 mg/dL — ABNORMAL HIGH (ref 8–23)
CO2: 19 mmol/L — ABNORMAL LOW (ref 22–32)
Calcium: 9.4 mg/dL (ref 8.9–10.3)
Chloride: 108 mmol/L (ref 98–111)
Creatinine, Ser: 1.42 mg/dL — ABNORMAL HIGH (ref 0.61–1.24)
GFR, Estimated: 53 mL/min — ABNORMAL LOW (ref >60)
Glucose, Bld: 78 mg/dL (ref 70–99)
Potassium: 3.4 mmol/L — ABNORMAL LOW (ref 3.5–5.1)
Sodium: 135 mmol/L (ref 135–145)
Total Bilirubin: 0.4 mg/dL (ref 0.0–1.2)
Total Protein: 4.8 g/dL — ABNORMAL LOW (ref 6.5–8.1)

## 2024-07-07 LAB — PHOSPHORUS: Phosphorus: 2.2 mg/dL — ABNORMAL LOW (ref 2.5–4.6)

## 2024-07-07 LAB — MAGNESIUM: Magnesium: 1.7 mg/dL (ref 1.7–2.4)

## 2024-07-07 NOTE — TOC Progression Note (Addendum)
 Transition of Care [facility]) - Progression Note    Patient Details  Name: Kristopher Thompson MRN: [id number] Date of Birth: [birth year]  Transition of Care [facility]) CM/SW Contact  Waddell [name] Rama, RN Phone Number: 07/07/2024, 1:50 PM  Clinical Narrative:    NCM spoke with patient and wife in the room offered choice for [facility], she states they are active with [facility] for HHRN, HHPT and would like to continue with them.  NCM confirmed with [employer] at [employer] he is active for HHRN,HHPT, and she states they can do the IV ABX  with Holley Herring ( [employer].)  But for right now looks like CIR is following for CIR bed.  If that does not work out then [facility] will follow.                     Expected Discharge Plan and Services                                               Social Drivers of Health (SDOH) Interventions SDOH Screenings   Food Insecurity: No Food Insecurity (07/02/2024)  Housing: Low Risk  (07/02/2024)  Transportation Needs: No Transportation Needs (07/02/2024)  Utilities: Not At Risk (07/02/2024)  Recent Concern: Utilities - At Risk (05/26/2024)  Social Connections: Socially Integrated (07/02/2024)  Tobacco Use: Medium Risk (06/17/2024)    Readmission Risk Interventions    05/26/2024    8:59 AM 04/12/2024    4:58 PM  Readmission Risk Prevention Plan  Transportation Screening Complete Complete  PCP or Specialist Appt within 5-7 Days Complete   PCP or Specialist Appt within 3-5 Days  Complete  Home Care Screening Complete   Medication Review (RN CM) Complete   HRI or Home Care Consult  Complete  Social Work Consult for Recovery Care Planning/Counseling  Complete  Palliative Care Screening  Not Applicable  Medication Review [employer])  Complete

## 2024-07-07 NOTE — Progress Notes (Signed)
 Physical Therapy Treatment Patient Details Name: Kristopher Thompson MRN: [id number] DOB: [birth year] Today's Date: 07/07/2024   History of Present Illness 73 y/o male admitted 07/01/24 from home with new onset of weakness, muffled voice, Lt hydronephrosis, obstructive renal failure and PNA. PMHx: Admit 5/30-7/11 for hernia repair with bowel perforation and DC to snf, readmit 7/14-7/16 with D/C to CIR 7/16-8/8. HTN, dCHF, CAD, DM2, PE on Eliquis , anemia, obesity, prostate and bladder CA, hernia repair, urostomy, colostomy, RBBB    PT Comments  Pt with flat affect, benefits from encouragement to maximize function and transfers stating he didn't sleep well and had pain. Pt able to stand with improved tolerance and begin stepping in transfers today with max HR 144 with activity. Pt fatigued after 2 standing trials and could not step on 2nd trial. Educated for HEP and progression, wife present throughout. Will continue to follow. Patient will benefit from intensive inpatient follow-up therapy, >3 hours/day      If plan is discharge home, recommend the following: A lot of help with walking and/or transfers;A lot of help with bathing/dressing/bathroom;Assist for transportation;Help with stairs or ramp for entrance   Can travel by private vehicle        Equipment Recommendations  None recommended by PT    Recommendations for Other Services       Precautions / Restrictions Precautions Precautions: Fall Recall of Precautions/Restrictions: Impaired Precaution/Restrictions Comments: urostomy, colostomy, lt PCN drain, watch BP     Mobility  Bed Mobility Overal bed mobility: Needs Assistance Bed Mobility: Rolling, Sidelying to Sit Rolling: Min assist, Used rails Sidelying to sit: Mod assist, Used rails, HOB elevated, +2 for physical assistance       General bed mobility comments: HOB 20 degrees, increased to clear legs, elevate trunk and pivot hips to EOB with mod +2 assist. mod assist to  scoot hips fully to EOB    Transfers Overall transfer level: Needs assistance   Transfers: Sit to/from Stand, Bed to chair/wheelchair/BSC Sit to Stand: Mod assist, +2 physical assistance, From elevated surface   Step pivot transfers: Mod assist, +2 physical assistance       General transfer comment: mod +2 to rise from bed with use of pad at sacrum, +2 mod assist step pivot with RW and short shuffling steps to move to chair and take a coulple steps forward. pt 2nd stand with heavy mod +2 assist of pad and able to shift weight but could not advance feet to step again    Ambulation/Gait               General Gait Details: not yet able   Stairs             Wheelchair Mobility     Tilt Bed    Modified Rankin (Stroke Patients Only)       Balance Overall balance assessment: Needs assistance Sitting-balance support: Feet supported, No upper extremity supported Sitting balance-Leahy Scale: Fair     Standing balance support: Bilateral upper extremity supported, Reliant on assistive device for balance, During functional activity Standing balance-Leahy Scale: Poor Standing balance comment: UB support in standing                            Communication Communication Communication: Impaired Factors Affecting Communication: Hearing impaired  Cognition Arousal: Alert Behavior During Therapy: Flat affect   PT - Cognitive impairments: Difficult to assess, Safety/Judgement, Problem solving Difficult to assess due to: Hard of  hearing/deaf                     PT - Cognition Comments: pt without hearing aids impairing ability to assess cognition, slow processing Following commands: Impaired Following commands impaired: Follows one step commands with increased time    Cueing Cueing Techniques: Verbal cues, Gestural cues  Exercises General Exercises - Lower Extremity Long Arc Quad: AROM, Both, Seated, 15 reps, Strengthening Hip Flexion/Marching:  AROM, Both, 15 reps, Strengthening, Standing    General Comments        Pertinent Vitals/Pain Pain Assessment Pain Assessment: No/denies pain    Home Living                          Prior Function            PT Goals (current goals can now be found in the care plan section) Progress towards PT goals: Progressing toward goals    Frequency    Min 3X/week      PT Plan      Co-evaluation              AM-PAC PT 6 Clicks Mobility   Outcome Measure  Help needed turning from your back to your side while in a flat bed without using bedrails?: A Lot Help needed moving from lying on your back to sitting on the side of a flat bed without using bedrails?: Total Help needed moving to and from a bed to a chair (including a wheelchair)?: Total Help needed standing up from a chair using your arms (e.g., wheelchair or bedside chair)?: Total Help needed to walk in hospital room?: Total Help needed climbing 3-5 steps with a railing? : Total 6 Click Score: 7    End of Session Equipment Utilized During Treatment: Other (comment) (thigh high ted hose) Activity Tolerance: Patient tolerated treatment well Patient left: in chair;with call bell/phone within reach;with chair alarm set;with family/visitor present Nurse Communication: Mobility status PT Visit Diagnosis: Muscle weakness (generalized) (M62.81);Other abnormalities of gait and mobility (R26.89);Other symptoms and signs involving the nervous system (R29.898)     Time: 9167-9140 PT Time Calculation (min) (ACUTE ONLY): 27 min  Charges:    $Therapeutic Exercise: 8-22 mins $Therapeutic Activity: 8-22 mins PT General Charges $$ ACUTE PT VISIT: 1 Visit                     Kristopher Thompson, PT Acute Rehabilitation Services Office: [phone]    Kristopher Thompson Latashia Koch 07/07/2024, 9:45 AM

## 2024-07-07 NOTE — Plan of Care (Signed)
 Diagnosis: Abd wall/mesh infection  Culture Result: ecoli (R cipro , bactrim , amp; I amp/sulb); corynebacterium species   Plan 3-6 months treatment   6 weeks iv vanc, ceftriaxone   6 more weeks ?dalbavancin vs transitioning to tedezolid/linezolid ; ceftriaxone  -> ?omado pending susceptibility     OPAT Orders Discharge antibiotics to be given via PICC line Discharge antibiotics: Vancomycin  Ceftriaxone   Aim for Vancomycin  trough 15-20 or AUC 400-550 (unless otherwise indicated) Duration: 6 weeks from 8/21 End Date: 10/02  [facility] Care Per Protocol:  Home health RN for IV administration and teaching; PICC line care and labs.    Labs weekly while on IV antibiotics: _x_ CBC with differential __ BMP _x_ CMP _x_ CRP _x_ ESR _x_ Vancomycin  trough __ CK  __ Please pull PIC at completion of IV antibiotics _x_ Please leave PIC in place until doctor has seen patient or been notified  Fax weekly labs to [phone]  Clinic Follow Up Appt: 9/18 @ 10am  @  [facility] [address], [place], [place] [address] Phone: [phone]

## 2024-07-07 NOTE — Progress Notes (Signed)
 Progress Note   Patient: Kristopher Thompson FMW:[id number] DOB: 01-30-1951 DOA: 07/01/2024  DOS: the patient was seen and examined on 07/07/2024   Brief hospital course:  Patient is a 73 year old with complicated history of HTN, CHFpEF, CAD, T2DM, PE on Eliquis , prostate cancer, bladder cancer who was admitted back in late May for incarcerated incisional hernia and urostomy ileal conduit revision.  Postoperative course was complicated by bowel perforation, sepsis, ICU admission with intubation, requiring pressors and CRRT.  Patient had bilateral nephrostomy tubes placed as well and had been in inpatient rehab until August 8 of this year.  At that time a study was done that supported capping the nephrostomy tubes.  Patient was sent home and was doing well including ambulating on his own, was tolerating p.o., had good urine output until approximately 3 days ago.  reports decreased urine output from about a liter a day to down to 300 in the last 24 hours.  Found to have aki and possible pyelonephritis.   Assessment and Plan:   Concern for severe sepsis-infected abdominal wall - Tachycardia, encephalopathy, attention, pyelonephritis on presentation concern for sepsis.  IV fluid bolus, blood cultures, empiric antibiotics on board.  Monitor blood pressure closely.  Etiology likely abdominal wall with drain with some purulent drainage.  This was removed by general surgery 8/21, tip Culture noting abundant e. Coli and Corynebacterium E. coli resistant to ampicillin, cefazolin , Cipro , Bactrim .  Remains on cefepime , vancomycin .  Blood cultures NGTD.  Purulence improving but still persistent.  Infectious disease consulted with precise plan (see ID note) for prolonged abx treatment course.     Acute kidney injury - Creatinine 3.57 on presentation, markedly above baseline 1-1.3.  Multi factorial etiology most likely secondary to ongoing urinary obstruction.  Percutaneous nephrostomy tubes uncapped and IV fluids on  board.  Appears to be resolved at this time after IV fluid hydration.  IV fluids discontinued.  Will recheck BMP in a.m.  Concern for urinary obstruction with L>R hydronephrosis - Nephrostomy tubes uncapped.  Evaluated by urology and following closely.  Urine output excellent bilaterally.  Per urology, right nephrostomy tube capped yesterday.  Very slight bump in urine creatinine, so watching kidney function closely.  Will recheck BMP in AM.   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  Nephrostomy tubes initially capped as urine output was good.  However given AKI and decreased urine output, now uncapped.  Evaluation by urology, general surgery.  No concern for urine leak.  Previous abdominal wall drain for bowel perforation and abscess removed by general surgery 8/21.  Culture noting abundant e. Coli and Corynebacterium E. coli resistant to ampicillin, cefazolin , Cipro , Bactrim .  Remains on cefepime , vancomycin .  Treatment duration as above.   Possible adrenal insufficiency - Noted mild hyper bow natremia with soft blood pressures.  Patient on fludrocortisone  p.o. received one-time dose of hydrocortisone  100 mg IV with minimal response.  Blood pressure appears stable.   Possible pyelonephritis - Leukocytosis, stranding noted on CT scan.  Continues on cefepime  and vanc.  Empiric urine cultures positive for MRSA as well as Enterococcus.   Obstructive sleep apnea - CPAP.   Chronic anticoagulation - Eliquis  on board   Physical debilitation muscle weakness - Evaluated by PT/OT.  Multiple hospitalizations recently.  Likely transition back to CIR.  Working closely with  case management on disposition planning.   Subjective: Patient  had some pain overnight requiring opioid therapy.  Was able to get some sleep.  No acute complaints otherwise.  No fevers, shortness of breath, chest pain, nausea, vomiting, abdominal pain.  Was able to attempt to work with PT/OT yesterday.  Physical Exam:  Vitals:   07/07/24 0006 07/07/24 0440 07/07/24 0812 07/07/24 0938  BP: (!) 101/52 (!) 102/54 (!) 98/54 101/62  Pulse: (!) 58 68 74   Resp: 18 18 17    Temp: 97.6 F (36.4 C) 98 F (36.7 C) (!) 97.5 F (36.4 C)   TempSrc: Oral Axillary Oral   SpO2: 98% 97% 97%   Weight:  90.4 kg    Height:        GENERAL:  Alert, pleasant, malaise HEENT:  EOMI CARDIOVASCULAR:  RRR, no murmurs appreciated RESPIRATORY:  Clear to auscultation, no wheezing, rales, or rhonchi GASTROINTESTINAL: Ventral surgical incision with granulation tissue, BL nephrostomy tubes, right flank wound from previous drain, mild purulence covered in fresh dressing, soft, nontender, nondistended EXTREMITIES:  No LE edema bilaterally NEURO:  No new focal deficits appreciated SKIN:  No rashes noted PSYCH:  Appropriate mood and affect    Data Reviewed:  Imaging Studies: CT ABDOMEN PELVIS WO CONTRAST Result Date: 07/02/2024 CLINICAL DATA:  Suspected sepsis EXAM: CT ABDOMEN AND PELVIS WITHOUT CONTRAST TECHNIQUE: Multidetector CT imaging of the abdomen and pelvis was performed following the standard protocol without IV contrast. RADIATION DOSE REDUCTION: This exam was performed according to the departmental dose-optimization program which includes automated exposure control, adjustment of the mA and/or kV according to patient size and/or use of iterative reconstruction technique. COMPARISON:  Chest x-ray 07/01/2024, CT 06/17/2024, 05/12/2024 FINDINGS: Lower chest: Lung bases demonstrates interval nodular airspace disease and ground-glass density in the left greater than right lung bases. No pleural effusion. Coronary vascular calcification. Hepatobiliary: No focal liver  abnormality is seen. Status post cholecystectomy. No biliary dilatation. Pancreas: Chronic pancreatitis. No acute inflammation. Stable 10 mm low-density lesion at the distal pancreas, series 3, image 24. Spleen: Normal in size without focal abnormality. Adrenals/Urinary Tract: Adrenal glands are normal. Patient is status post cysto prostatectomy with right abdominal ileal conduit. Bilateral percutaneous nephrostomy catheters remain in place. Right-sided ureteral stent, extending from the renal pelvis to the right abdominal urostomy. Residual mild to moderate right-sided hydronephrosis but slightly diminished compared to most recent prior CT. Atrophic left kidney. Interval moderate severe left hydronephrosis and hydroureter, ureter dilated down to the ileal conduit, stricture was demonstrated on nephrostogram 05/11/2024. Small gas bubble in the left renal pelvis. Slight increased left perinephric stranding with mild stranding about the now dilated left ureter. Stomach/Bowel: Stomach is nondistended. No dilated small bowel. Contrast within the colon. No acute bowel wall thickening. Right abdominal ileal conduit. Separate right abdominal ileostomy. 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 Vascular/Lymphatic: Aortic atherosclerosis. No aneurysm. No suspicious lymph nodes. Reproductive: Prostatectomy. Other: Negative for pelvic effusion. Fat containing right inguinal hernia. Drainage catheter in the subcutaneous soft tissues of the right abdominal wall near the urostomy. Decreased fluid collection within the right ventral pelvic wall, limited characterization without contrast. Sample measurement right laterally measures about 2.5 cm on series 3, image 66, previously 3.4 cm. Musculoskeletal: No acute osseous abnormality. Old superior endplate deformity at L1 IMPRESSION: 1. Interval development of moderate to severe left hydronephrosis and hydroureter, ureter dilated down to the ileal conduit, stricture was  demonstrated on nephrostogram 05/11/2024. Slight increased left perinephric stranding with mild stranding about the  now dilated left ureter, correlate for superimposed infection. Correlate also for function of the left percutaneous nephrostomy tube. 2. Bilateral percutaneous nephrostomy catheters remain in place. Right-sided ureteral stent, extending from the renal pelvis to the right abdominal urostomy. Residual mild to moderate right-sided hydronephrosis but slightly diminished compared to most recent prior CT. 3. Interval nodular airspace disease and ground-glass density in the left greater than right lung bases, suspect for pneumonia and or aspiration. 4. Drainage catheter in the subcutaneous soft tissues of the right abdominal wall near the urostomy. Decreased fluid collection within the right ventral pelvic wall, limited characterization without contrast. 5. Aortic atherosclerosis. Aortic Atherosclerosis (ICD10-I70.0). Electronically Signed   By: Luke Bun M.D.   On: 07/02/2024 01:57   DG Chest Port 1 View if patient is in a treatment room. Result Date: 07/01/2024 CLINICAL DATA: Suspected sepsis EXAM: PORTABLE CHEST 1 VIEW COMPARISON:  Chest radiograph June 12, 2024. FINDINGS: The heart size and mediastinal contours are within normal limits. Both lungs are clear. The visualized skeletal structures are unremarkable. IMPRESSION: No active disease. Electronically Signed   By: Megan  Zare M.D.   On: 07/01/2024 17:45   DG Loopogram Result Date: 06/17/2024 INDICATION: Patient with a history of a radical cystectomy/ileal conduit urinary diversion with a known leak of the ileal conduit with bilateral nephrostomy tubes placed 05/04/24 as well as placement of a right retrograde ureteral stent 05/14/24. Request for loopogram to assess for further evidence of urinary leak. COMPARISON:  DG LOOPOGRAM 05/12/24 CONTRAST:  A total of 60 mL Isovue -300 administered was administered into the bilateral nephrostomy tubes as  well as retrograde ureteral stent. FLUOROSCOPY TIME:  21 mGy COMPLICATIONS: None immediate. TECHNIQUE: Informed written consent was obtained from the patient after a discussion of the risks, benefits and alternatives to treatment. Questions regarding the procedure were encouraged and answered. A timeout was performed prior to the initiation of the procedure. A pre procedural spot fluoroscopic image was obtained. The bilateral flank and external portions of existing nephrostomy catheters and right ureteral stent were prepped and sterilized. Antegrade injection of 20 mL of contrast into the existing right nephrostomy catheter demonstrated appropriate positioning within the renal pelvis. After several minutes, no contrast was identified within the right ureter or ileal conduit. Antegrade injection of 20 mL of contrast into the existing left nephrostomy catheter demonstrated appropriate positioning within the renal pelvis. After several minutes, a small volume of contrast was noted to flow through the left ureter into the ileal conduit. Retrograde injection of the right ureteral stent opacified the right ureter and right renal collecting system. No contrast extravasation observed. Temporary dressing applied to the ileal conduit ostomy site. Bilateral nephrostomy tubes were capped as requested by ordering MD. The patient tolerated the procedure well without immediate postprocedural complication. FINDINGS: The existing right nephrostomy catheter is appropriately positioned and functioning. Right retrograde ureteral stent in place through ileal conduit. IMPRESSION: 1. Right nephrostogram via the right nephrostomy tube and right ureteral stent demonstrates normally patent ureter and ureteral anastomosis with the ileal conduit. No right-sided urine leak demonstrated. 2. Left nephrostogram through left nephrostomy tube appears in good position with evidence of contrast flow to the distal ureter. No left-sided urine leak  demonstrated. Performed by: Kacie Matthews PA-C Electronically Signed   By: Ester Sides M.D.   On: 06/17/2024 16:00   CT ABDOMEN PELVIS W WO CONTRAST Result Date: 06/17/2024 CLINICAL DATA:  Abdominal pain, postop, ileal conduit leak. EXAM: CT ABDOMEN AND PELVIS WITHOUT AND WITH CONTRAST TECHNIQUE:  Multidetector CT imaging of the abdomen and pelvis was performed following the standard protocol before and following the bolus administration of intravenous contrast. RADIATION DOSE REDUCTION: This exam was performed according to the departmental dose-optimization program which includes automated exposure control, adjustment of the mA and/or kV according to patient size and/or use of iterative reconstruction technique. CONTRAST:  75mL OMNIPAQUE  IOHEXOL  350 MG/ML SOLN COMPARISON:  05/12/2024. FINDINGS: Lower chest: A few tiny pulmonary nodules are unchanged from 08/29/2018 and are considered benign. Heart size normal. No pericardial effusion. No pleural effusion. Distal esophagus is grossly unremarkable. Hepatobiliary: Liver is grossly unremarkable. Cholecystectomy. No biliary ductal dilatation. Pancreas: Chronic calcific pancreatitis. 1.3 cm low-attenuation lesion in the pancreatic tail (3/21), unchanged from 08/29/2018, compatible with a pseudocyst. Spleen: Negative. Adrenals/Urinary Tract: Negative. Stomach/Bowel: Bilateral percutaneous nephrostomies with contrast in the collecting systems bilaterally, secondary to a loopogram performed prior to this study. Possible small stone in the right kidney (3/24). Persistent moderate to severe dilatation of the right intrarenal collecting system and right ureter with a double-J right ureteral stent in place, proximal loop in the right renal pelvis and distal portion the on the right lower quadrant ileal conduit. Left kidney is atrophic and scarred. Partial opacification of the left ureter which is decompressed. Ileal conduit in the right lower quadrant which may be stenotic  as it traverses the abdominal wall, as on 05/12/2024. No extraluminal contrast. Cysto prostatectomy. Vascular/Lymphatic: Stomach is unremarkable. Right lower quadrant ileostomy and separate ileal conduit. Small bowel and colon are otherwise unremarkable. Appendix is not readily visualized. Reproductive: Prostatectomy. Other: Small right inguinal hernia contains fat. Enlarging broad fluid collection along the right ventral pelvic wall, just above the ileal conduit, measuring 3.4 x 21.6 cm. A percutaneous drain is seen within. No free fluid or extraluminal contrast. Musculoskeletal: Degenerative changes in the spine. Old L1 compression fracture. IMPRESSION: 1. 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 2. Enlarging fluid collection along the right ventral pelvic abdominal wall with a percutaneous drain in place, indicative of an abscess. 3. 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 4. Right lower quadrant ileostomy. 5. Probable small right renal stone. 6. Chronic calcific pancreatitis. Electronically Signed   By: Newell Eke M.D.   On: 06/17/2024 15:47   DG Chest 2 View Result Date: 06/12/2024 CLINICAL DATA:  10031 Cough 10031 EXAM: CHEST - 2 VIEW COMPARISON:  Chest x-ray 05/26/2024, CT chest 04/14/2024 FINDINGS: The heart and mediastinal contours are within normal limits. No focal consolidation. No pulmonary edema. No pleural effusion. No pneumothorax. No acute osseous abnormality. IMPRESSION: No active cardiopulmonary disease. Electronically Signed   By: Morgane  Naveau M.D.   On: 06/12/2024 18:56    There are no new results to review at this time.  Previous records (including but not limited to H&P, progress notes, nursing notes, TOC management) were reviewed in assessment of this patient.  Labs: CBC: Recent Labs  Lab 07/01/24 1716 07/02/24 0316 07/03/24 0835  07/04/24 0304 07/05/24 0239 07/06/24 0259 07/07/24 0323  WBC 16.9*   < > 8.0 7.5 8.4 7.5 9.1  NEUTROABS 13.7*  --   --   --   --   --   --   HGB 15.4   < > 12.5* 11.3* 11.4* 10.3* 10.5*  HCT 44.9   < > 38.0* 33.6* 35.4* 31.7* 33.0*  MCV 79.9*   < > 83.2 83.0 84.3 84.3 85.9  PLT 330   < > 186 189 204 221 214   < > = values in this interval not displayed.   Basic Metabolic Panel: Recent Labs  Lab 07/02/24 0316 07/03/24 0835 07/04/24 0304 07/05/24 0239 07/06/24 0259 07/07/24 0323  NA 125* 131* 132* 131* 133* 135  K 3.9 3.3* 3.4* 4.2 3.8 3.4*  CL 90* 99 103 103 108 108  CO2 15* 18* 21* 21* 19* 19*  GLUCOSE 109* 84 87 88 91 78  BUN 123* 89* 70* 53* 46* 42*  CREATININE 3.18* 2.12* 1.83* 1.52* 1.37* 1.42*  CALCIUM  10.2 9.7 9.3 9.4 9.5 9.4  MG  --  2.3 2.0 1.8 1.8 1.7  PHOS 5.5*  --   --   --  1.8* 2.2*   Liver Function Tests: Recent Labs  Lab 07/02/24 0316 07/04/24 0304 07/05/24 0239 07/06/24 0259 07/07/24 0323  AST 22 16 14* 14* 18  ALT 27 17 16 15 18   ALKPHOS 89 67 68 60 60  BILITOT 0.7 0.4 0.5 0.4 0.4  PROT 6.0* 5.1* 5.4* 5.0* 4.8*  ALBUMIN  2.8* 2.0* 2.0* 1.7* 1.6*   CBG: Recent Labs  Lab 07/05/24 0815  GLUCAP 92    Scheduled Meds:  apixaban   5 mg Oral BID   Chlorhexidine  Gluconate Cloth  6 each Topical Daily   cyanocobalamin   500 mcg Oral Q M,W,F   cyproheptadine   4 mg Oral QHS   feeding supplement  237 mL Oral BID BM   fludrocortisone   0.2 mg Oral Daily   Gerhardt's butt cream  1 Application Topical BID   leptospermum manuka honey  1 Application Topical Daily   loperamide   4 mg Oral TID   melatonin  3 mg Oral QHS   metoCLOPramide   5 mg Oral TID AC   midodrine   15 mg Oral TID WC   multivitamin with minerals  1 tablet Oral Daily   nutrition supplement (JUVEN)  1 packet Oral BID BM   omega-3 acid ethyl esters  1 g Oral QODAY   mouth rinse  15 mL Mouth Rinse 4 times per day   psyllium  1 packet Oral Daily   rosuvastatin   10 mg Oral QPM   sertraline    100 mg Oral Daily   Continuous Infusions:  cefTRIAXone  (ROCEPHIN )  IV 2 g (07/07/24 0012)   vancomycin  1,250 mg (07/06/24 1650)   PRN Meds:.acetaminophen , alum & mag hydroxide-simeth, cyclobenzaprine , hydrOXYzine , ipratropium, metoCLOPramide  (REGLAN ) injection, mouth rinse, oxyCODONE -acetaminophen , prochlorperazine , sodium chloride   Family Communication: Wife at bedside  Disposition: Status is: Inpatient Remains inpatient appropriate because: Sepsis, abdominal wall infection     Time spent: 49 minutes  Length of inpatient stay: 6 days  Author: Carliss LELON Canales, DO 07/07/2024 11:33 AM  For on call review [email or web address].

## 2024-07-07 NOTE — Plan of Care (Signed)
  Problem: Education: Goal: Knowledge of General Education information will improve Description: Including pain rating scale, medication(s)/side effects and non-pharmacologic comfort measures Outcome: Progressing   Problem: Clinical Measurements: Goal: Will remain free from infection Outcome: Progressing   Problem: Skin Integrity: Goal: Risk for impaired skin integrity will decrease Outcome: Progressing   

## 2024-07-07 NOTE — Progress Notes (Signed)
 Mobility Specialist Progress Note:    07/07/24 1019  Mobility  Activity Pivoted/transferred from chair to bed  Level of Assistance Maximum assist, patient does 25-49%  Assistive Device Stedy  Distance Ambulated (ft) 3 ft  Activity Response Tolerated poorly;RN notified  Mobility Referral No  Mobility visit 1 Mobility  Mobility Specialist Start Time (ACUTE ONLY) 1019  Mobility Specialist Stop Time (ACUTE ONLY) 1032  Mobility Specialist Time Calculation (min) (ACUTE ONLY) 13 min   Received pt from recliner requesting to be transferred back to bed. No c/o any symptoms. Pt needing mod to max in order to stand and sit in steady. Returned pt back to bed w/ all needs met.   Venetia Keel Mobility Specialist Please Neurosurgeon or Rehab Office at [phone]

## 2024-07-07 NOTE — Progress Notes (Signed)
  Subjective: Patient doing well very tired.  Drainage from previous drain site on the right abdominal wall decreased.  Per nurse right nephrostomy tube was capped overnight.  Objective: Vital signs in last 24 hours: Temp:  [97.6 F (36.4 C)-98.2 F (36.8 C)] 98 F (36.7 C) (08/26 0440) Pulse Rate:  [58-78] 68 (08/26 0440) Resp:  [18] 18 (08/26 0440) BP: (100-113)/(52-68) 102/54 (08/26 0440) SpO2:  [97 %-99 %] 97 % (08/26 0440) Weight:  [90.4 kg] 90.4 kg (08/26 0440)  Intake/Output from previous day: 08/25 0701 - 08/26 0700 In: 850 [P.O.:50; I.V.:700; IV Piggyback:100] Out: 2750 [Urine:2000; Stool:750] Intake/Output this shift: Total I/O In: -  Out: 650 [Urine:550; Stool:100]  Physical Exam:  General: Alert and oriented CV: RRR Lungs: Clear Abdomen: Soft, ND, ATTP; ostomy in place draining clear yellow urine. GU: Right nephrostomy tube capped.  Lab Results: Recent Labs    07/05/24 0239 07/06/24 0259 07/07/24 0323  HGB 11.4* 10.3* 10.5*  HCT 35.4* 31.7* 33.0*   BMET Recent Labs    07/06/24 0259 07/07/24 0323  NA 133* 135  K 3.8 3.4*  CL 108 108  CO2 19* 19*  GLUCOSE 91 78  BUN 46* 42*  CREATININE 1.37* 1.42*  CALCIUM  9.5 9.4     Studies/Results: No results found.  Assessment/Plan: 69 M complex hx including RP, cystectomy and Ileal conduit, most recently had parstomal hernia repair with injury to base of conduit managed with bilateral nephrotomy tubes. Admitted for UTI/AKI, and dehydration.    1) UTI/AKI/dehydration:  As a precaution, will leave bilateral PCNs to drainage over the weekend and until renal function has returned to baseline (Cr 1.3).  This will also allow optimal treatment of his MRSA UTI.  He does not appear to be obstructed on right side but did have evidence of at least partial to near complete obstruction of left poorly functional kidney.  Will leave left PCN to drainage for now and upon hospital discharge.  AKI likely multifactorial due  to UTI, dehydration, and possibly a component of obstruction.  Now improving. - Right nephrostomy tube capped 8/25 evening, creatinine slightly increased to 1.4 from 1.3 yesterday we will continue to observe if fevers or significant worsening kidney function will uncap right nephrostomy tube.   2) Urine leak: Resolved. Abdominal drain removed per Dr. Sheldon and I agree.  Culture obtained and will need to be covered with antibiotics appropriately based on that polymicrobial culture.  Will defer this therapy and duration to Dr. Sheldon. - Ucx + fro MRSA and Enterococcus  - on cefepime  and vanc    3) right drain site leakage - Drainage has decreased significantly since Sunday. - ID has been consulted plan for 6 weeks antibiotics.   LOS: 6 days   Jackey Pea MD 07/07/2024, 8:03 AM [facility]

## 2024-07-07 NOTE — Progress Notes (Signed)
   Inpatient Rehabilitation Admissions Coordinator   I met with patient and wife at bedside. They would like to admit to CIR when medically cleared by Attending MD. I will follow and plan admit when medically ready for discharge from acute hospital.  Heron Leavell, RN, MSN Rehab Admissions Coordinator [phone] 07/07/2024 2:10 PM

## 2024-07-07 NOTE — Progress Notes (Signed)
  PHARMACY CONSULT NOTE FOR:  OUTPATIENT  PARENTERAL ANTIBIOTIC THERAPY (OPAT)  Indication: Intra-abdominal abscess Regimen: Vancomycin  1250 mg IV every 24 hours and Rocephin  2g IV every 24 hours End date: 08/13/24  IV antibiotic discharge orders are pended. To discharging provider:  please sign these orders via discharge navigator,  Select New Orders & click on the button choice - Manage This Unsigned Work.     Thank you for allowing pharmacy to be a part of this patient's care.  Almarie Lunger, PharmD, BCPS, BCIDP Infectious Diseases Clinical Pharmacist 07/07/2024 3:02 PM   **Pharmacist phone directory can now be found on [email or web address] ([email or web address]).  Listed under [facility].

## 2024-07-07 NOTE — Plan of Care (Signed)
  Problem: Education: Goal: Knowledge of General Education information will improve Description: Including pain rating scale, medication(s)/side effects and non-pharmacologic comfort measures Outcome: Progressing   Problem: Clinical Measurements: Goal: Ability to maintain clinical measurements within normal limits will improve Outcome: Progressing   Problem: Clinical Measurements: Goal: Will remain free from infection Outcome: Progressing   Problem: Clinical Measurements: Goal: Diagnostic test results will improve Outcome: Progressing   Problem: Clinical Measurements: Goal: Respiratory complications will improve Outcome: Progressing   Problem: Clinical Measurements: Goal: Cardiovascular complication will be avoided Outcome: Progressing   Problem: Activity: Goal: Risk for activity intolerance will decrease Outcome: Progressing

## 2024-07-08 DIAGNOSIS — C679 Malignant neoplasm of bladder, unspecified: Secondary | ICD-10-CM | POA: Diagnosis not present

## 2024-07-08 DIAGNOSIS — A419 Sepsis, unspecified organism: Secondary | ICD-10-CM | POA: Diagnosis not present

## 2024-07-08 DIAGNOSIS — R69 Illness, unspecified: Secondary | ICD-10-CM | POA: Diagnosis not present

## 2024-07-08 DIAGNOSIS — K2100 Gastro-esophageal reflux disease with esophagitis, without bleeding: Secondary | ICD-10-CM | POA: Insufficient documentation

## 2024-07-08 DIAGNOSIS — N179 Acute kidney failure, unspecified: Secondary | ICD-10-CM | POA: Diagnosis not present

## 2024-07-08 DIAGNOSIS — N1832 Chronic kidney disease, stage 3b: Secondary | ICD-10-CM

## 2024-07-08 LAB — BASIC METABOLIC PANEL WITH GFR
Anion gap: 10 (ref 5–15)
BUN: 39 mg/dL — ABNORMAL HIGH (ref 8–23)
CO2: 18 mmol/L — ABNORMAL LOW (ref 22–32)
Calcium: 9.2 mg/dL (ref 8.9–10.3)
Chloride: 110 mmol/L (ref 98–111)
Creatinine, Ser: 1.45 mg/dL — ABNORMAL HIGH (ref 0.61–1.24)
GFR, Estimated: 51 mL/min — ABNORMAL LOW (ref >60)
Glucose, Bld: 87 mg/dL (ref 70–99)
Potassium: 3.2 mmol/L — ABNORMAL LOW (ref 3.5–5.1)
Sodium: 138 mmol/L (ref 135–145)

## 2024-07-08 LAB — VANCOMYCIN, PEAK: Vancomycin Pk: >60 ug/mL (ref 30–40)

## 2024-07-08 LAB — CBC
HCT: 32.7 % — ABNORMAL LOW (ref 39.0–52.0)
Hemoglobin: 10.8 g/dL — ABNORMAL LOW (ref 13.0–17.0)
MCH: 27.9 pg (ref 26.0–34.0)
MCHC: 33 g/dL (ref 30.0–36.0)
MCV: 84.5 fL (ref 80.0–100.0)
Platelets: 228 K/uL (ref 150–400)
RBC: 3.87 MIL/uL — ABNORMAL LOW (ref 4.22–5.81)
RDW: 14.3 % (ref 11.5–15.5)
WBC: 8.7 K/uL (ref 4.0–10.5)
nRBC: 0 % (ref 0.0–0.2)

## 2024-07-08 LAB — VANCOMYCIN, RANDOM: Vancomycin Rm: 52 ug/mL

## 2024-07-08 MED ORDER — POTASSIUM CHLORIDE CRYS ER 20 MEQ PO TBCR
40.0000 meq | EXTENDED_RELEASE_TABLET | Freq: Once | ORAL | Status: AC
  Administered 2024-07-08: 40 meq via ORAL
  Filled 2024-07-08: qty 2

## 2024-07-08 NOTE — Plan of Care (Signed)
   Problem: Education: Goal: Knowledge of General Education information will improve Description: Including pain rating scale, medication(s)/side effects and non-pharmacologic comfort measures Outcome: Progressing   Problem: Clinical Measurements: Goal: Will remain free from infection Outcome: Progressing

## 2024-07-08 NOTE — Progress Notes (Signed)
 [facility]  PROGRESS NOTE  Kristopher Thompson FMW:[id number] DOB: 29-Jun-1951 DOA: 07/01/2024 PCP: [name], MD   Brief HPI:   73 year old with complicated history of HTN, CHFpEF, CAD, T2DM, PE on Eliquis , prostate cancer, bladder cancer who was admitted back in late May for incarcerated incisional hernia and urostomy ileal conduit revision. Postoperative course was complicated by bowel perforation, sepsis, ICU admission with intubation, requiring pressors and CRRT. Patient had bilateral nephrostomy tubes placed as well and had been in inpatient rehab until August 8 of this year. At that time a study was done that supported capping the nephrostomy tubes. Patient was sent home and was doing well including ambulating on his own, was tolerating p.o., had good urine output until approximately 3 days ago. reports decreased urine output from about a liter a day to down to 300 in the last 24 hours. Found to have aki and possible pyelonephritis.     Assessment/Plan:   severe sepsis-infected abdominal wall -Presents with sinus tachycardia, encephalopathy, pyonephritis -Started on broad-spectrum antibiotics -Etiology infected abdominal wall with purulent material in the drain, this was removed by general surgery on 8/21 culture tip noted abundant E. coli and Corynebacterium, E. coli was resistant to ampicillin, cefazolin , Cipro  and Bactrim . -ID consulted, plan to continue  ceftriaxone  and vancomycin     Acute kidney injury - Creatinine 3.57 on presentation, markedly above baseline 1-1.3.  Multi factorial etiology most likely secondary to ongoing urinary obstruction.  Percutaneous nephrostomy tubes uncapped and IV fluids on board.  Appears to be resolved at this time after IV fluid hydration.   Creatinine improved to 1.45   Concern for urinary obstruction with L>R hydronephrosis - Nephrostomy tubes uncapped.  Evaluated by urology and following closely.  Urine output excellent bilaterally.   Per urology, right nephrostomy tube capped yesterday.  Very slight bump in urine creatinine, so watching kidney function closely.     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  Nephrostomy tubes initially capped as urine output was good.  However given AKI and decreased urine output, now uncapped.  Evaluation by urology, general surgery.  No concern for urine leak.  Previous abdominal wall drain for bowel perforation and abscess removed by general surgery 8/21.  Culture noting abundant e. Coli and Corynebacterium E. coli resistant to ampicillin, cefazolin , Cipro , Bactrim .  Remains on cefepime , vancomycin .  Treatment duration as above.   ?  Adrenal insufficiency - Noted mild hyponatremia with soft blood pressures.  Patient on fludrocortisone  p.o. received one-time dose of hydrocortisone  100 mg IV with minimal response.  Blood pressure appears stable.  ? pyelonephritis - Leukocytosis, stranding noted on CT scan.  Continues on cefepime  and vanc.  Empiric urine cultures positive for MRSA as well as Enterococcus.   Obstructive sleep apnea - CPAP.   Chronic anticoagulation - Eliquis  on board  Hypokalemia -potassium is 3.2 -will replace potassium and check bmp in am   Physical debilitation muscle weakness - Evaluated by PT/OT.  Multiple hospitalizations recently.  Likely transition back to CIR.  Working closely with case management on disposition planning.      Medications     apixaban   5 mg Oral BID   Chlorhexidine  Gluconate Cloth  6 each Topical Daily   cyanocobalamin   500 mcg Oral Q M,W,F   cyproheptadine   4 mg Oral QHS  feeding supplement  237 mL Oral BID BM   fludrocortisone   0.2 mg Oral Daily   Gerhardt's butt  cream  1 Application Topical BID   leptospermum manuka honey  1 Application Topical Daily   loperamide   4 mg Oral TID   melatonin  3 mg Oral QHS   metoCLOPramide   5 mg Oral TID AC   midodrine   15 mg Oral TID WC   multivitamin with minerals  1 tablet Oral Daily   nutrition supplement (JUVEN)  1 packet Oral BID BM   omega-3 acid ethyl esters  1 g Oral QODAY   mouth rinse  15 mL Mouth Rinse 4 times per day   psyllium  1 packet Oral Daily   rosuvastatin   10 mg Oral QPM   sertraline   100 mg Oral Daily     Data Reviewed:   CBG:  Recent Labs  Lab 07/05/24 0815  GLUCAP 92    SpO2: 97 % FiO2 (%): 21 %    Vitals:   07/07/24 2330 07/08/24 0417 07/08/24 0424 07/08/24 0720  BP: 110/63 (!) 80/55 100/65 107/61  Pulse: (!) 58 60  76  Resp: 18 16  18   Temp: 98.2 F (36.8 C) 98 F (36.7 C)  98.2 F (36.8 C)  TempSrc: Oral Oral  Oral  SpO2: 96%   97%  Weight:      Height:          Data Reviewed:  Basic Metabolic Panel: Recent Labs  Lab 07/02/24 0316 07/03/24 0835 07/04/24 0304 07/05/24 0239 07/06/24 0259 07/07/24 0323 07/08/24 0246  NA 125* 131* 132* 131* 133* 135 138  K 3.9 3.3* 3.4* 4.2 3.8 3.4* 3.2*  CL 90* 99 103 103 108 108 110  CO2 15* 18* 21* 21* 19* 19* 18*  GLUCOSE 109* 84 87 88 91 78 87  BUN 123* 89* 70* 53* 46* 42* 39*  CREATININE 3.18* 2.12* 1.83* 1.52* 1.37* 1.42* 1.45*  CALCIUM  10.2 9.7 9.3 9.4 9.5 9.4 9.2  MG  --  2.3 2.0 1.8 1.8 1.7  --   PHOS 5.5*  --   --   --  1.8* 2.2*  --     CBC: Recent Labs  Lab 07/01/24 1716 07/02/24 0316 07/04/24 0304 07/05/24 0239 07/06/24 0259 07/07/24 0323 07/08/24 0246  WBC 16.9*   < > 7.5 8.4 7.5 9.1 8.7  NEUTROABS 13.7*  --   --   --   --   --   --   HGB 15.4   < > 11.3* 11.4* 10.3* 10.5* 10.8*  HCT 44.9   < > 33.6* 35.4* 31.7* 33.0* 32.7*  MCV 79.9*   < > 83.0 84.3 84.3 85.9 84.5  PLT 330   < > 189 204 221 214 228   < > = values in this interval not displayed.    LFT Recent Labs  Lab  07/02/24 0316 07/04/24 0304 07/05/24 0239 07/06/24 0259 07/07/24 0323  AST 22 16 14* 14* 18  ALT 27 17 16 15 18   ALKPHOS 89 67 68 60 60  BILITOT 0.7 0.4 0.5 0.4 0.4  PROT 6.0* 5.1* 5.4* 5.0* 4.8*  ALBUMIN  2.8* 2.0* 2.0* 1.7* 1.6*     Antibiotics: Anti-infectives (From admission, onward)    Start     Dose/Rate Route Frequency Ordered Stop   07/07/24 0000  cefTRIAXone  (ROCEPHIN ) 2 g in sodium chloride  0.9 % 100 mL IVPB        2 g 200 mL/hr over 30  Minutes Intravenous Every 24 hours 07/06/24 2249     07/06/24 1600  vancomycin  (VANCOREADY) IVPB 1250 mg/250 mL        1,250 mg 166.7 mL/hr over 90 Minutes Intravenous Every 24 hours 07/06/24 1502     07/03/24 1600  vancomycin  (VANCOCIN ) IVPB 1000 mg/200 mL premix  Status:  Discontinued        1,000 mg 200 mL/hr over 60 Minutes Intravenous Every 24 hours 07/03/24 1422 07/06/24 1502   07/03/24 1600  ceFEPIme  (MAXIPIME ) 2 g in sodium chloride  0.9 % 100 mL IVPB  Status:  Discontinued        2 g 200 mL/hr over 30 Minutes Intravenous Every 12 hours 07/03/24 1423 07/06/24 2249   07/03/24 1345  linezolid  (ZYVOX ) IVPB 600 mg  Status:  Discontinued        600 mg 300 mL/hr over 60 Minutes Intravenous Every 12 hours 07/03/24 1252 07/03/24 1306   07/02/24 1800  ceFEPIme  (MAXIPIME ) 2 g in sodium chloride  0.9 % 100 mL IVPB  Status:  Discontinued        2 g 200 mL/hr over 30 Minutes Intravenous Every 24 hours 07/02/24 0323 07/03/24 1423   07/02/24 0345  vancomycin  (VANCOREADY) IVPB 2000 mg/400 mL        2,000 mg 200 mL/hr over 120 Minutes Intravenous  Once 07/02/24 0324 07/02/24 0600   07/01/24 2030  ceFEPIme  (MAXIPIME ) 2 g in sodium chloride  0.9 % 100 mL IVPB        2 g 200 mL/hr over 30 Minutes Intravenous  Once 07/01/24 2020 07/01/24 2308        DVT prophylaxis: Apixaban   Code Status: Full code  Family Communication: Discussed with patient's wife at bedside   CONSULTS urology, IR, ID, general surgery   Subjective   Denies any  complaints   Objective    Physical Examination:   General-appears in no acute distress Heart-S1-S2, regular, no murmur auscultated Lungs-clear to auscultation bilaterally, no wheezing or crackles auscultated Abdomen-soft, nontender, no organomegaly, colostomy in place, dressing noted on anterior abdominal wall Extremities-no edema in the lower extremities Neuro-alert, oriented x3, no focal deficit noted  Status is: Inpatient:             Kristopher Thompson   [facility] If 7PM-7AM, please contact night-coverage at [email or web address], Office  [phone]   07/08/2024, 9:27 AM  LOS: 7 days

## 2024-07-08 NOTE — Progress Notes (Signed)
 Occupational Therapy Treatment Patient Details Name: Kristopher Thompson MRN: [id number] DOB: 12/24/1950 Today's Date: 07/08/2024   History of present illness 73 y/o male admitted 07/01/24 from home with new onset of weakness, muffled voice, Lt hydronephrosis, obstructive renal failure and PNA. PMHx: Admit 5/30-7/11 for hernia repair with bowel perforation and DC to snf, readmit 7/14-7/16 with D/C to CIR 7/16-8/8. HTN, dCHF, CAD, DM2, PE on Eliquis , anemia, obesity, prostate and bladder CA, hernia repair, urostomy, colostomy, RBBB   OT comments  Pt agreeable to participate in today's therapy session, takes increased time to process commands but was able to navigate and initiate bed mobility with mod cues to sequence and min to mod A. Max A using stedy to stand but pt demonstrating visible signs of increased effort and HR elevating with prolonged OOB. Once returned to sitting EOB his HR would return to normal resting levels. Worked with pt on activities to promote core engagement. OT to continue to progress pt as able. DC plans remain appropriate for CIR.        If plan is discharge home, recommend the following:  Two people to help with walking and/or transfers;A lot of help with bathing/dressing/bathroom;Two people to help with bathing/dressing/bathroom;Assist for transportation   Equipment Recommendations  None recommended by OT (defer to next level of care)    Recommendations for Other Services      Precautions / Restrictions Precautions Precautions: Fall Recall of Precautions/Restrictions: Impaired Precaution/Restrictions Comments: urostomy, colostomy, lt PCN drain, watch BP Restrictions Weight Bearing Restrictions Per Provider Order: No       Mobility Bed Mobility Overal bed mobility: Needs Assistance Bed Mobility: Rolling, Sidelying to Sit, Sit to Supine Rolling: Mod assist, Used rails Sidelying to sit: Mod assist, HOB elevated   Sit to supine: Mod assist, Used rails    General bed mobility comments: mod A to asisst with BLEs with return to supine. Cues to sequece with bed rails. Max A + pads and cues for head to body positioning to scoot laterally to [facility],    Transfers Overall transfer level: Needs assistance Equipment used: Ambulation equipment used Transfers: Sit to/from Stand Sit to Stand: Via lift equipment           General transfer comment: STSx3 from stedy max A, cues for anterior weight shift and shifting hips anteriorly. HR elevated despite being rested in perched position. Transfer via Lift Equipment: Stedy   Balance Overall balance assessment: Needs assistance Sitting-balance support: Feet supported, No upper extremity supported Sitting balance-Leahy Scale: Fair Sitting balance - Comments: CGA   Standing balance support: Bilateral upper extremity supported, Reliant on assistive device for balance, During functional activity Standing balance-Leahy Scale: Poor Standing balance comment: reliant on ext assist.                           ADL either performed or assessed with clinical judgement   ADL       Grooming: Sitting;Contact guard assist;Wash/dry face                                 General ADL Comments: not able to progress to standing ADLs using stedy. HR too high once in perched position.    Extremity/Trunk Assessment              Vision       Perception     Praxis  Communication     Cognition Arousal: Alert Behavior During Therapy: Flat affect             Executive functioning impairment (select all impairments): Problem solving OT - Cognition Comments: Pt speaks occasionally, slow to process commands                 Following commands: Impaired Following commands impaired: Follows one step commands with increased time      Cueing   Cueing Techniques: Verbal cues, Gestural cues  Exercises General Exercises - Lower Extremity Long Arc Quad: AROM, Both, Seated,  Strengthening, 20 reps (2x10) Hip ABduction/ADduction: AROM, Both, 10 reps, Seated Hip Flexion/Marching: AROM, Both, Strengthening, Standing, 20 reps (2x10) Other Exercises Other Exercises: dynamic reaching sitting EOB, pulling on stedy    Shoulder Instructions       General Comments Hr up to 162 bpm max with standing in stedy, 153 bpm while in perched position, cues for pursed lip breathing-pt with increased RR.    Pertinent Vitals/ Pain       Pain Assessment Pain Assessment: No/denies pain  Home Living                                          Prior Functioning/Environment              Frequency  Min 2X/week        Progress Toward Goals  OT Goals(current goals can now be found in the care plan section)  Progress towards OT goals: Progressing toward goals  Acute Rehab OT Goals Patient Stated Goal: to get better OT Goal Formulation: With patient Time For Goal Achievement: 07/18/24 Potential to Achieve Goals: Good  Plan      Co-evaluation                 AM-PAC OT 6 Clicks Daily Activity     Outcome Measure   Help from another person eating meals?: None Help from another person taking care of personal grooming?: A Little Help from another person toileting, which includes using toliet, bedpan, or urinal?: A Lot Help from another person bathing (including washing, rinsing, drying)?: A Lot Help from another person to put on and taking off regular upper body clothing?: A Little Help from another person to put on and taking off regular lower body clothing?: Total 6 Click Score: 15    End of Session Equipment Utilized During Treatment: Gait belt;Rolling walker (2 wheels)  OT Visit Diagnosis: Unsteadiness on feet (R26.81);Other abnormalities of gait and mobility (R26.89);Muscle weakness (generalized) (M62.81)   Activity Tolerance Patient tolerated treatment well   Patient Left in bed;with call bell/phone within reach;with  nursing/sitter in room;with family/visitor present   Nurse Communication Mobility status        Time: 8469-8387 OT Time Calculation (min): 42 min  Charges: OT General Charges $OT Visit: 1 Visit OT Treatments $Therapeutic Activity: 23-37 mins $Therapeutic Exercise: 8-22 mins  07/08/2024  AB, OTR/L  Acute Rehabilitation Services  Office: [phone]   Curtistine JONETTA Das 07/08/2024, 4:29 PM

## 2024-07-08 NOTE — Plan of Care (Signed)

## 2024-07-08 NOTE — Progress Notes (Signed)
  Subjective: Patient more confused and tired this AM. Dressing over drain site fairly dry, decreased drainage. UOP adequate, mainly via urostomy. R neph tube capped. Left with 325ccs output over last 24hr. Colostomy with stool output. Awaiting exchange of all tubes with VIR.   Objective: Vital signs in last 24 hours: Temp:  [97.5 F (36.4 C)-98.2 F (36.8 C)] 98 F (36.7 C) (08/27 0417) Pulse Rate:  [58-110] 60 (08/27 0417) Resp:  [16-18] 16 (08/27 0417) BP: (80-115)/(54-66) 100/65 (08/27 0424) SpO2:  [96 %-98 %] 96 % (08/26 2330)  Intake/Output from previous day: 08/26 0701 - 08/27 0700 In: 537 [P.O.:537] Out: 2525 [Urine:2050; Stool:475] Intake/Output this shift: No intake/output data recorded.  Physical Exam:  General: Alert and oriented to self  CV: RRR Lungs: Clear Abdomen: Soft, ND, ATTP; ostomy in place draining clear yellow urine.  GU: Right nephrostomy tube capped.  Lab Results: Recent Labs    07/06/24 0259 07/07/24 0323 07/08/24 0246  HGB 10.3* 10.5* 10.8*  HCT 31.7* 33.0* 32.7*   BMET Recent Labs    07/07/24 0323 07/08/24 0246  NA 135 138  K 3.4* 3.2*  CL 108 110  CO2 19* 18*  GLUCOSE 78 87  BUN 42* 39*  CREATININE 1.42* 1.45*  CALCIUM  9.4 9.2     Studies/Results: No results found.  Assessment/Plan: 3 M complex hx including RP, cystectomy and Ileal conduit, most recently had parstomal hernia repair with injury to base of conduit managed with bilateral nephrotomy tubes. Admitted for UTI/AKI, and dehydration.    1) UTI/AKI/dehydration - likely multifactorial due to UTI, dehydration, and possibly a component of obstruction.  Initially managed with putting BL PCNs to drainage. Right nephrostomy tube capped on 8/25 with Cr initially slightly uptrending, though stable this Am.  -We will continue to observe if fevers or significant worsening kidney function will uncap right nephrostomy tube. -Plan to exchange all tubes with VIR.   2) Urine leak:  Resolved.  Abdominal drain removed per Dr. Sheldon and I agree. Culture from drain with G+ rods and E. Coli. Ucx with MRSA and Enterococcus. On Vanc/Cefepime . Defer abx to gen surg. ID following, will be on long-term antibiotics.    3) Right drain site leakage: improving  -ID following    LOS: 7 days   Lyle GEANNIE Civil, MD Resident, PGY4 Department of Urology

## 2024-07-08 NOTE — Progress Notes (Signed)
 MD and Pharmacist made aware about the critical results for Vancomycin  Peak and Vancomycin  Random.

## 2024-07-09 ENCOUNTER — Inpatient Hospital Stay (HOSPITAL_COMMUNITY)

## 2024-07-09 DIAGNOSIS — N179 Acute kidney failure, unspecified: Secondary | ICD-10-CM | POA: Diagnosis not present

## 2024-07-09 DIAGNOSIS — R69 Illness, unspecified: Secondary | ICD-10-CM | POA: Diagnosis not present

## 2024-07-09 DIAGNOSIS — A419 Sepsis, unspecified organism: Secondary | ICD-10-CM | POA: Diagnosis not present

## 2024-07-09 DIAGNOSIS — C679 Malignant neoplasm of bladder, unspecified: Secondary | ICD-10-CM | POA: Diagnosis not present

## 2024-07-09 HISTORY — PX: IR EXT NEPHROURETERAL CATH EXCHANGE: IMG5418

## 2024-07-09 HISTORY — PX: IR NEPHROSTOMY EXCHANGE LEFT: IMG6069

## 2024-07-09 LAB — COMPREHENSIVE METABOLIC PANEL WITH GFR
ALT: 25 U/L (ref 0–44)
AST: 21 U/L (ref 15–41)
Albumin: 1.9 g/dL — ABNORMAL LOW (ref 3.5–5.0)
Alkaline Phosphatase: 73 U/L (ref 38–126)
Anion gap: 8 (ref 5–15)
BUN: 34 mg/dL — ABNORMAL HIGH (ref 8–23)
CO2: 18 mmol/L — ABNORMAL LOW (ref 22–32)
Calcium: 9.6 mg/dL (ref 8.9–10.3)
Chloride: 112 mmol/L — ABNORMAL HIGH (ref 98–111)
Creatinine, Ser: 1.48 mg/dL — ABNORMAL HIGH (ref 0.61–1.24)
GFR, Estimated: 50 mL/min — ABNORMAL LOW (ref >60)
Glucose, Bld: 77 mg/dL (ref 70–99)
Potassium: 3.5 mmol/L (ref 3.5–5.1)
Sodium: 138 mmol/L (ref 135–145)
Total Bilirubin: 0.5 mg/dL (ref 0.0–1.2)
Total Protein: 5.1 g/dL — ABNORMAL LOW (ref 6.5–8.1)

## 2024-07-09 LAB — VANCOMYCIN, TROUGH: Vancomycin Tr: 39 ug/mL (ref 15–20)

## 2024-07-09 LAB — PHOSPHORUS: Phosphorus: 2.5 mg/dL (ref 2.5–4.6)

## 2024-07-09 MED ORDER — VANCOMYCIN VARIABLE DOSE PER UNSTABLE RENAL FUNCTION (PHARMACIST DOSING): Status: DC

## 2024-07-09 MED ORDER — FENTANYL CITRATE (PF) 100 MCG/2ML IJ SOLN
INTRAMUSCULAR | Status: AC | PRN
  Administered 2024-07-09: 50 ug via INTRAVENOUS

## 2024-07-09 MED ORDER — LIDOCAINE HCL 1 % IJ SOLN
INTRAMUSCULAR | Status: AC
Start: 2024-07-09 — End: 2024-07-09
  Filled 2024-07-09: qty 20

## 2024-07-09 MED ORDER — LIDOCAINE HCL 1 % IJ SOLN
INTRAMUSCULAR | Status: AC
  Filled 2024-07-09: qty 20

## 2024-07-09 MED ORDER — SODIUM CHLORIDE 0.9% FLUSH
5.0000 mL | Freq: Three times a day (TID) | INTRAVENOUS | Status: DC
  Administered 2024-07-09 – 2024-07-11 (×6): 5 mL

## 2024-07-09 MED ORDER — FENTANYL CITRATE (PF) 100 MCG/2ML IJ SOLN
INTRAMUSCULAR | Status: AC
Start: 2024-07-09 — End: 2024-07-09
  Filled 2024-07-09: qty 2

## 2024-07-09 MED ORDER — IOHEXOL 300 MG/ML  SOLN: 50.0000 mL | Freq: Once | INTRAMUSCULAR | Status: DC | PRN

## 2024-07-09 NOTE — Procedures (Signed)
 Interventional Radiology Procedure Note  Procedure: Exchange of bilateral nephrostomy catheters  Complications: None  Estimated Blood Loss: < 10 mL  Findings: 72fr bilateral PCN exchange.  The right PCN is now capped.  Exchange 59fr retrograde ureteral catheter.  Cordella DELENA Banner, MD

## 2024-07-09 NOTE — Progress Notes (Signed)
   07/09/24 1938  BiPAP/CPAP/SIPAP  BiPAP/CPAP/SIPAP Pt Type Adult  Reason BIPAP/CPAP not in use Non-compliant  BiPAP/CPAP /SiPAP Vitals  Pulse Rate 100  Resp 18  SpO2 97 %  Bilateral Breath Sounds Clear;Diminished  MEWS Score/Color  MEWS Score 0  MEWS Score Color Landy

## 2024-07-09 NOTE — Progress Notes (Signed)
 [facility]  PROGRESS NOTE  Kristopher Thompson FMW:[id number] DOB: 1950-11-14 DOA: 07/01/2024 PCP: [name], MD   Brief HPI:   73 year old with complicated history of HTN, CHFpEF, CAD, T2DM, PE on Eliquis , prostate cancer, bladder cancer who was admitted back in late May for incarcerated incisional hernia and urostomy ileal conduit revision. Postoperative course was complicated by bowel perforation, sepsis, ICU admission with intubation, requiring pressors and CRRT. Patient had bilateral nephrostomy tubes placed as well and had been in inpatient rehab until August 8 of this year. At that time a study was done that supported capping the nephrostomy tubes. Patient was sent home and was doing well including ambulating on his own, was tolerating p.o., had good urine output until approximately 3 days ago. reports decreased urine output from about a liter a day to down to 300 in the last 24 hours. Found to have aki and possible pyelonephritis.     Assessment/Plan:   severe sepsis-infected abdominal wall -Sepsis physiology has resolved -Presented with sinus tachycardia, encephalopathy, pyonephritis -Started on broad-spectrum antibiotics -Etiology infected abdominal wall with purulent material in the drain, this was removed by general surgery on 8/21 culture tip noted abundant E. coli and Corynebacterium, E. coli was resistant to ampicillin, cefazolin , Cipro  and Bactrim . -ID consulted, plan to continue  ceftriaxone  and vancomycin     Acute kidney injury - Creatinine 3.57 on presentation, markedly above baseline 1-1.3.  Multi factorial etiology most likely secondary to ongoing urinary obstruction.  Percutaneous nephrostomy tubes uncapped and IV fluids on board.  Appears to be resolved at this time after IV fluid hydration.   Creatinine improved to 1.48   Concern for urinary obstruction with L>R hydronephrosis - Nephrostomy tubes uncapped.  Evaluated by urology and following closely.  Urine  output excellent bilaterally.  Per urology, right nephrostomy tube capped yesterday.  Very slight bump in urine creatinine, so watching kidney function closely.   -Underwent exchange of bilateral nephrostomy tubes today   Bladder cancer s/p radical cystectomy and ileal conduit - Initial surgery 07/2017, subsequent lower complicated course after with urine leak, ureteral anastomosis, parastomal hernia incarceration, bowel perforation with abscess, creation of end ileostomy, and hydronephrosis requiring bilateral nephrostomy tubes.  Nephrostomy tubes initially capped as urine output was good.  However given AKI and decreased urine output, now uncapped.  Evaluation by urology, general surgery.  No concern for urine leak.  Previous abdominal wall drain for bowel perforation and abscess removed by general surgery 8/21.  Culture noting abundant e. Coli and Corynebacterium E. coli resistant to ampicillin, cefazolin , Cipro , Bactrim .  Remains on cefepime , vancomycin .  Treatment duration as above.   ?  Adrenal insufficiency - Noted mild hyponatremia with soft blood pressures.  Patient on fludrocortisone  p.o. received one-time dose of hydrocortisone  100 mg IV with minimal response.  Blood pressure appears stable.  ? pyelonephritis - Leukocytosis, stranding noted on CT scan.  Continues on cefepime  and vanc.  Empiric urine cultures positive for MRSA as well as Enterococcus.   Obstructive sleep apnea - CPAP.   Chronic anticoagulation - Eliquis  on board  Hypokalemia - Replete   Physical debilitation muscle weakness - Evaluated by PT/OT.  Multiple hospitalizations recently.  Likely transition back to CIR.  Working closely with case management on disposition planning.      Medications     apixaban   5 mg Oral BID   Chlorhexidine  Gluconate Cloth  6 each Topical Daily   cyanocobalamin   500 mcg Oral Q M,W,F   cyproheptadine   4 mg  Oral QHS   feeding supplement  237 mL Oral BID BM   fludrocortisone   0.2  mg Oral Daily   Gerhardt's butt cream  1 Application Topical BID   leptospermum manuka honey  1 Application Topical Daily   loperamide   4 mg Oral TID   melatonin  3 mg Oral QHS   metoCLOPramide   5 mg Oral TID AC   midodrine   15 mg Oral TID WC   multivitamin with minerals  1 tablet Oral Daily   nutrition supplement (JUVEN)  1 packet Oral BID BM   omega-3 acid ethyl esters  1 g Oral QODAY   mouth rinse  15 mL Mouth Rinse 4 times per day   psyllium  1 packet Oral Daily   rosuvastatin   10 mg Oral QPM   sertraline   100 mg Oral Daily   vancomycin  variable dose per unstable renal function (pharmacist dosing)   Does not apply See admin instructions     Data Reviewed:   CBG:  Recent Labs  Lab 07/05/24 0815  GLUCAP 92    SpO2: 94 % O2 Flow Rate (L/min): 2 L/min FiO2 (%): 21 %    Vitals:   07/08/24 2019 07/09/24 0005 07/09/24 0515 07/09/24 0706  BP: 120/71 121/69  108/72  Pulse: 68 71  82  Resp: 18 18  18   Temp: 98.4 F (36.9 C) 98.2 F (36.8 C) 97.8 F (36.6 C) 98.2 F (36.8 C)  TempSrc: Oral Oral  Oral  SpO2: 96% 96%  94%  Weight:   89 kg   Height:          Data Reviewed:  Basic Metabolic Panel: Recent Labs  Lab 07/03/24 0835 07/04/24 0304 07/05/24 0239 07/06/24 0259 07/07/24 0323 07/08/24 0246 07/09/24 0252  NA 131* 132* 131* 133* 135 138 138  K 3.3* 3.4* 4.2 3.8 3.4* 3.2* 3.5  CL 99 103 103 108 108 110 112*  CO2 18* 21* 21* 19* 19* 18* 18*  GLUCOSE 84 87 88 91 78 87 77  BUN 89* 70* 53* 46* 42* 39* 34*  CREATININE 2.12* 1.83* 1.52* 1.37* 1.42* 1.45* 1.48*  CALCIUM  9.7 9.3 9.4 9.5 9.4 9.2 9.6  MG 2.3 2.0 1.8 1.8 1.7  --   --   PHOS  --   --   --  1.8* 2.2*  --  2.5    CBC: Recent Labs  Lab 07/04/24 0304 07/05/24 0239 07/06/24 0259 07/07/24 0323 07/08/24 0246  WBC 7.5 8.4 7.5 9.1 8.7  HGB 11.3* 11.4* 10.3* 10.5* 10.8*  HCT 33.6* 35.4* 31.7* 33.0* 32.7*  MCV 83.0 84.3 84.3 85.9 84.5  PLT 189 204 221 214 228    LFT Recent Labs  Lab  07/04/24 0304 07/05/24 0239 07/06/24 0259 07/07/24 0323 07/09/24 0252  AST 16 14* 14* 18 21  ALT 17 16 15 18 25   ALKPHOS 67 68 60 60 73  BILITOT 0.4 0.5 0.4 0.4 0.5  PROT 5.1* 5.4* 5.0* 4.8* 5.1*  ALBUMIN  2.0* 2.0* 1.7* 1.6* 1.9*     Antibiotics: Anti-infectives (From admission, onward)    Start     Dose/Rate Route Frequency Ordered Stop   07/09/24 0806  vancomycin  variable dose per unstable renal function (pharmacist dosing)         Does not apply See admin instructions 07/09/24 0806     07/07/24 0000  cefTRIAXone  (ROCEPHIN ) 2 g in sodium chloride  0.9 % 100 mL IVPB        2 g 200 mL/hr  over 30 Minutes Intravenous Every 24 hours 07/06/24 2249     07/06/24 1600  vancomycin  (VANCOREADY) IVPB 1250 mg/250 mL  Status:  Discontinued        1,250 mg 166.7 mL/hr over 90 Minutes Intravenous Every 24 hours 07/06/24 1502 07/09/24 0806   07/03/24 1600  vancomycin  (VANCOCIN ) IVPB 1000 mg/200 mL premix  Status:  Discontinued        1,000 mg 200 mL/hr over 60 Minutes Intravenous Every 24 hours 07/03/24 1422 07/06/24 1502   07/03/24 1600  ceFEPIme  (MAXIPIME ) 2 g in sodium chloride  0.9 % 100 mL IVPB  Status:  Discontinued        2 g 200 mL/hr over 30 Minutes Intravenous Every 12 hours 07/03/24 1423 07/06/24 2249   07/03/24 1345  linezolid  (ZYVOX ) IVPB 600 mg  Status:  Discontinued        600 mg 300 mL/hr over 60 Minutes Intravenous Every 12 hours 07/03/24 1252 07/03/24 1306   07/02/24 1800  ceFEPIme  (MAXIPIME ) 2 g in sodium chloride  0.9 % 100 mL IVPB  Status:  Discontinued        2 g 200 mL/hr over 30 Minutes Intravenous Every 24 hours 07/02/24 0323 07/03/24 1423   07/02/24 0345  vancomycin  (VANCOREADY) IVPB 2000 mg/400 mL        2,000 mg 200 mL/hr over 120 Minutes Intravenous  Once 07/02/24 0324 07/02/24 0600   07/01/24 2030  ceFEPIme  (MAXIPIME ) 2 g in sodium chloride  0.9 % 100 mL IVPB        2 g 200 mL/hr over 30 Minutes Intravenous  Once 07/01/24 2020 07/01/24 2308        DVT  prophylaxis: Apixaban   Code Status: Full code  Family Communication: Discussed with patient's wife at bedside   CONSULTS urology, IR, ID, general surgery   Subjective   Underwent exchange of bilateral nephrostomy catheters today.   Objective    Physical Examination:   Appears in no acute distress S1-S2, regular, no murmur auscultated Lungs clear to auscultation bilaterally Abdomen is soft, nontender, no organomegaly  Status is: Inpatient:             Kristopher Thompson Brod   [facility] If 7PM-7AM, please contact night-coverage at [email or web address], Office  [phone]   07/09/2024, 8:41 AM  LOS: 8 days

## 2024-07-09 NOTE — Progress Notes (Signed)
  Subjective: Patient doing well this AM. Just returned from [facility] for exchange of BL neph tubes and right indwelling stent. Reviewed with nurses ostomy device placement. Cr stable. R neph tube remained capped.   Objective: Vital signs in last 24 hours: Temp:  [97.8 F (36.6 C)-99 F (37.2 C)] 98 F (36.7 C) (08/28 1228) Pulse Rate:  [68-152] 121 (08/28 1230) Resp:  [16-22] 16 (08/28 1200) BP: (108-130)/(69-89) 116/75 (08/28 1230) SpO2:  [94 %-100 %] 97 % (08/28 1230) Weight:  [89 kg] 89 kg (08/28 0515)  Intake/Output from previous day: 08/27 0701 - 08/28 0700 In: 360 [P.O.:360] Out: 1960 [Urine:1035; Stool:925] Intake/Output this shift: No intake/output data recorded.  Physical Exam:  General: Alert and oriented to self  CV: RRR Lungs: Clear Abdomen: Soft, ND, ATTP; ostomy device not in place, stent seen from urostomy  GU: Right nephrostomy tube capped.   Lab Results: Recent Labs    07/07/24 0323 07/08/24 0246  HGB 10.5* 10.8*  HCT 33.0* 32.7*   BMET Recent Labs    07/08/24 0246 07/09/24 0252  NA 138 138  K 3.2* 3.5  CL 110 112*  CO2 18* 18*  GLUCOSE 87 77  BUN 39* 34*  CREATININE 1.45* 1.48*  CALCIUM  9.2 9.6     Studies/Results: No results found.  Assessment/Plan: 42 M complex hx including RP, cystectomy and Ileal conduit, most recently had parstomal hernia repair with injury to base of conduit managed with bilateral nephrotomy tubes. Admitted for UTI/AKI, and dehydration.    1) UTI/AKI/dehydration - likely multifactorial due to UTI, dehydration, and possibly a component of obstruction.  Initially managed with putting BL PCNs to drainage. Right nephrostomy tube capped on 8/25 with Cr initially slightly uptrending, though stable now.  S/p exchange of BL neph tubes and indwelling R stent. Right neph tube remains capped.  -We will continue to observe if fevers or significant worsening kidney function will uncap right nephrostomy tube. -Please ensure that  ostomy device is placed over urostomy site and that the stent is uncapped and allowed to drain. This was reviewed with nursing at bedside today.   2) Urine leak: Resolved.  Abdominal drain removed per Dr. Sheldon and I agree. Culture from drain with G+ rods and E. Coli. Ucx with MRSA and Enterococcus. On Vanc/Cefepime . Defer abx to gen surg. ID following, will be on long-term antibiotics.    3) Right drain site leakage: improving  -ID following    LOS: 8 days   Lyle GEANNIE Civil, MD Resident, PGY4 Department of Urology

## 2024-07-09 NOTE — Sedation Documentation (Signed)
 Not a sedation case.;this RN is present to provide pain control and be of assistance as required. Will monitor patient VS throughout procedure.

## 2024-07-09 NOTE — Progress Notes (Addendum)
 Chief Complaint: Patient was seen in consultation today for routine exchange of bilateral PCN and right retrograde ureteral stent exchanges.  Referring Provider(s): Dr. Carliss Jersey, DO   Supervising Physician: Jenna Hacker  Patient Status: Kristopher Thompson - In-pt  Patient is Full Code  History of Present Illness: Kristopher Thompson is a 73 y.o. male  with PMHx notable for prostate cancer s/p prostatectomy and bladder cancer s/p cystectomy and ilial conduit formation, CHF, OSA, DM, and others, as delineated below.  Patient is know to IR service, having most recently undergone bilateral PCN exchange and right ureteral retrograde stent placement by Dr. Jenna on 7/2. IR was again requested for routine bilateral PCN exchange, with capping of the right PCN, as well as routine exchange of the right ureteral stent. Patient is tentatively scheduled for same in IR today.   Patient is alert and laying in bed, calm.  Patient is currently with minor abdominal discomfort from recent abdominal midline incisional wound. Patient denies any fevers, headache, chest pain, SOB, cough, nausea, vomiting or bleeding.    Past Medical History:  Diagnosis Date   At risk for sleep apnea    12-25-2017   STOP-BANG SCORE= 5   --- SENT TO PCP   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   moderate atypia - right low back   Atypical nevus 04/04/2007   moderate to marked - right upper back (wider shave)   Atypical nevus 04/04/2007   moderate atypia - Thompson chest (wider shave)   Atypical nevus 04/04/2007   slight atypia - right thigh   Atypical nevus 11/29/2011   mild atypia - Thompson upper back   Atypical nevus 11/29/2011   mild atypia - Thompson chest   Bacteremia due to Klebsiella pneumoniae 10/09/2017   Bladder cancer (HCC) dx 07/2017   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 denies   Sleep apnea    cpap   Squamous cell carcinoma of skin 05/22/2013   left cheek - CX3 + 5FU   Wears glasses     Past Surgical History:  Procedure Laterality Date   ABDOMINAL SURGERY     APPENDECTOMY  1972   BOWEL RESECTION N/A 04/20/2024   Procedure: SMALL BOWEL RESECTION;  Surgeon: [name], MD;  Location: [facility];  Service: General;  Laterality: N/A;   CHOLECYSTECTOMY  1985   COLONOSCOPY N/A 06/29/2021   Procedure: COLONOSCOPY;  Surgeon: Debby Hila, MD;  Location: [facility];  Service: Endoscopy;  Laterality: N/A;   COLOSTOMY REVERSAL N/A 01/08/2018   Procedure: COLOSTOMY REVERSAL;  Surgeon: Debby Hila, MD;  Location: [facility];  Service: General;  Laterality: N/A;   CYSTOSCOPY WITH RETROGRADE PYELOGRAM, URETEROSCOPY AND STENT PLACEMENT Right 06/10/2017   Procedure: CYSTOSCOPY WITH RIGHT URETEROSCOPY WITH RIGHT STENT PLACEMENT;  Surgeon: [name], MD;  Location: [facility];  Service: Urology;  Laterality: Right;   EUS N/A 04/16/2018  Procedure: FULL UPPER ENDOSCOPIC ULTRASOUND (EUS) RADIAL;  Surgeon: Burnette Fallow, MD;  Location: [facility];  Service: Endoscopy;  Laterality: N/A;   FLEXIBLE SIGMOIDOSCOPY N/A 12/13/2017   Procedure: FLEXIBLE SIGMOIDOSCOPY;  Surgeon: Debby Hila, MD;  Location: [facility];  Service: Endoscopy;  Laterality: N/A;   ILEO CONDUIT     IR CATHETER TUBE CHANGE  12/13/2017   IR CATHETER TUBE CHANGE  01/17/2018   IR CATHETER TUBE CHANGE  02/28/2018   IR CATHETER TUBE CHANGE  07/18/2018   IR CATHETER TUBE CHANGE  08/22/2018   IR CATHETER TUBE CHANGE   10/31/2018   IR CONVERT LEFT NEPHROSTOMY TO NEPHROURETERAL CATH  10/24/2017   IR EXT NEPHROURETERAL CATH EXCHANGE  05/19/2018   IR EXT NEPHROURETERAL CATH EXCHANGE  06/16/2018   IR NEPHRO TUBE REMOV/FL  10/24/2017   IR NEPHROSTOGRAM LEFT THRU EXISTING ACCESS  12/05/2018   IR NEPHROSTOMY EXCHANGE LEFT  05/11/2024   IR NEPHROSTOMY PLACEMENT LEFT  10/07/2017   IR NEPHROSTOMY PLACEMENT RIGHT  05/04/2024   IR NEPHROSTOMY TUBE CHANGE  04/11/2018   IR URETERAL STENT PLACEMENT EXISTING ACCESS LEFT  05/13/2024   IR URETERAL STENT RIGHT NEW ACCESS W/SEP NEPHROSTOMY CATH  05/13/2024   LAPAROTOMY N/A 04/20/2024   Procedure: EXPLORATORY LAPAROTOMY;  Surgeon: [name], MD;  Location: [facility];  Service: General;  Laterality: N/A;   PARTIAL COLECTOMY N/A 04/21/2024   Procedure: COLECTOMY, PARTIAL;  Surgeon: [name], MD;  Location: [facility];  Service: General;  Laterality: N/A;  Removal Wound Vac, Washout Ostomy, Possible Anastomosis, Possible Ileostomy.  Phasix Mesh.   POLYPECTOMY  06/29/2021   Procedure: POLYPECTOMY;  Surgeon: Debby Hila, MD;  Location: [facility];  Service: Endoscopy;;   ROBOT ASSISTED LAPAROSCOPIC RADICAL PROSTATECTOMY  10/02/2012   Procedure: ROBOTIC ASSISTED LAPAROSCOPIC RADICAL PROSTATECTOMY LEVEL 2;  Surgeon: Noretta Ferrara, MD;  Location: [facility];  Service: Urology;  Laterality: N/A;   ROBOTIC ASSISTED LAPAROSCOPIC BLADDER DIVERTICULECTOMY N/A 08/08/2017   Procedure: XI ROBOTIC ASSISTED LAPAROSCOPIC RADICAL CYSTECTOMY COVERTED TO OPEN PELVIC LYMPHADNECTOMY BILATERAL AND ILEAL CONDUIT URINARY DIVERSION;  Surgeon: Ferrara Glance, MD;  Location: [facility];  Service: Urology;  Laterality: N/A;   TRANSURETHRAL RESECTION OF BLADDER TUMOR  06/10/2017   Procedure: TRANSURETHRAL RESECTION OF BLADDER TUMOR (TURBT);  Surgeon: Ferrara Glance, MD;  Location: [facility];  Service: Urology;;   VACUUM ASSISTED CLOSURE CHANGE N/A 04/20/2024   Procedure: PLACEMENT OF CONCETTA CAPES;  Surgeon: [name], MD;  Location:  [facility];  Service: General;  Laterality: N/A;   VENTRAL HERNIA REPAIR N/A 04/10/2024   Procedure: REPAIR, HERNIA, VENTRAL;  Surgeon: [name], MD;  Location: [facility];  Service: General;  Laterality: N/A;   XI ROBOT ABDOMINAL PERINEAL RESECTION N/A 08/08/2017   Procedure: REPAIR OF RECTAL TEAR POSSIBLE PARTIAL PROCTECTOMY, CREATION OF  OSTOMY;  Surgeon: Debby Hila, MD;  Location: [facility];  Service: General;  Laterality: N/A;   XI ROBOTIC ASSISTED PARASTOMAL HERNIA REPAIR N/A 04/10/2024   Procedure: REPAIR, HERNIA, PARASTOMAL AND VENTRAL HERNIAS, ROBOT-ASSISTED, LEFT INGUINAL HERNIA, LYSIS OF ADHESIONS INCARCERATED AND INSIONAL HERNIAS AND UROSTOMY REVISION;  Surgeon: [name], MD;  Location: [facility];  Service: General;  Laterality: N/A;    Allergies: Demerol  [meperidine ]  Medications: Prior to Admission medications   Medication Sig Start Date End Date Taking? Authorizing Provider  acetaminophen  (TYLENOL ) 325 MG tablet Take 1-2 tablets (325-650 mg total) by mouth every 4 (four) hours as needed for mild pain (pain score 1-3). 06/18/24  Yes Leak, Brandi L, NP  alum & mag hydroxide-simeth (MAALOX PLUS) 400-400-40 MG/5ML suspension Take 15 mLs by mouth every 4 (four) hours as needed for indigestion. 06/18/24  Yes Leak, Brandi L, NP  cyclobenzaprine  (FLEXERIL ) 5 MG tablet Take 1 tablet (5 mg total) by mouth 3 (three) times daily as needed for muscle spasms. 04/10/24  Yes [name], MD  cyproheptadine  (PERIACTIN ) 4 MG tablet Take 1 tablet (4 mg total) by mouth at bedtime. 06/18/24  Yes Leak, Brandi L, NP  ELIQUIS  5 MG TABS tablet Take 5 mg by mouth 2 (two) times daily.   Yes [provider]  feeding supplement (ENSURE PLUS HIGH PROTEIN) LIQD Take 237 mLs by mouth 2 (two) times daily between meals. Patient taking differently: Take 237 mLs by mouth daily. 05/22/24  Yes Sheikh, Omair Latif, DO  hydrOXYzine  (ATARAX ) 25 MG tablet Take 1 tablet (25 mg total) by mouth 3 (three) times daily as  needed for anxiety. 05/22/24  Yes Sheikh, Omair Latif, DO  leptospermum manuka honey (MEDIHONEY) PSTE paste Apply 1 Application topically daily. 06/19/24  Yes Leak, Brandi L, NP  loperamide  (IMODIUM ) 2 MG capsule Take 2 capsules (4 mg total) by mouth 2 (two) times daily. 05/27/24  Yes Pahwani, Fredia, MD  melatonin 3 MG TABS tablet Take 1 tablet (3 mg total) by mouth at bedtime. 06/18/24  Yes Leak, Brandi L, NP  Metamucil Fiber 2 g CHEW Chew 1 Dose by mouth 2 (two) times daily.   Yes [provider]  metoCLOPramide  (REGLAN ) 5 MG tablet Take 1 tablet (5 mg total) by mouth 3 (three) times daily before meals. 06/18/24  Yes Leak, Brandi L, NP  midodrine  (PROAMATINE ) 5 MG tablet Take 3 tablets (15 mg total) by mouth 3 (three) times daily with meals. Patient taking differently: Take 15 mg by mouth 2 (two) times daily with a meal. 06/18/24  Yes Leak, Brandi L, NP  nutrition supplement, JUVEN, (JUVEN) PACK Take 1 packet by mouth 2 (two) times daily between meals. Patient taking differently: Take 1 packet by mouth daily. 05/22/24  Yes Sheikh, Omair Latif, DO  oxyCODONE -acetaminophen  (PERCOCET) 5-325 MG tablet Take 2 tablets by mouth every 6 (six) hours as needed for severe pain (pain score 7-10). 06/18/24  Yes Leak, Brandi L, NP  pantoprazole  (PROTONIX ) 40 MG tablet Take 40 mg by mouth daily. 06/30/24  Yes [provider]  prochlorperazine  (COMPAZINE ) 5 MG tablet Take 1 tablet (5 mg total) by mouth every 6 (six) hours as needed for nausea. 06/18/24  Yes Leak, Brandi L, NP  sodium chloride  (OCEAN) 0.65 % SOLN nasal spray Place 1-2 sprays into both nostrils every 6 (six) hours as needed for congestion (dry nose). 05/22/24  Yes Sheikh, Omair Latif, DO  vitamin B-12 (CYANOCOBALAMIN ) 500 MCG tablet Take 500 mcg by mouth daily.   Yes [provider]  ferrous sulfate  325 (65 FE) MG tablet Take 1 tablet (325 mg total) by mouth 2 (two) times daily with a meal. Patient not taking: Reported on 07/02/2024 06/18/24    Leak, Brandi L, NP  fludrocortisone  (FLORINEF ) 0.1 MG tablet Take 2 tablets (0.2 mg total) by mouth daily. Patient not taking: Reported on 07/02/2024 06/19/24   Leak, Brandi L, NP  ipratropium (ATROVENT ) 0.03 % nasal spray Place 1 spray into both nostrils every 8 (eight) hours as needed (allergies). Patient not taking: Reported on 07/02/2024    [provider]  metFORMIN (GLUCOPHAGE) 500 MG tablet Take 500 mg by mouth daily. Patient not taking: Reported on 07/02/2024 01/29/24  [provider]  Multiple Vitamin (MULTIVITAMIN PO) Take 1 tablet by mouth daily. Patient not taking: Reported on 07/02/2024    [provider]  Nystatin (GERHARDT'S BUTT CREAM) CREA Apply 1 Application topically 2 (two) times daily. Patient not taking: Reported on 07/02/2024 06/18/24   Monty Daphne CROME, NP  Omega-3 Fatty Acids (FISH OIL) 1000 MG CAPS Take 1,000 mg by mouth every Monday, Wednesday, and Friday. Patient not taking: Reported on 07/02/2024    [provider]  polycarbophil (FIBERCON) 625 MG tablet Take 1 tablet (625 mg total) by mouth 2 (two) times daily. Patient not taking: Reported on 07/02/2024 05/20/24   [name], MD  promethazine  (PHENERGAN ) 25 MG tablet Take 25 mg by mouth every 8 (eight) hours as needed. Patient not taking: Reported on 07/02/2024 06/25/24   [provider]  psyllium (HYDROCIL/METAMUCIL) 95 % PACK Take 1 packet by mouth daily. Patient not taking: Reported on 07/02/2024 06/19/24   Leak, Brandi L, NP  rosuvastatin  (CRESTOR ) 10 MG tablet Take 1 tablet (10 mg total) by mouth every evening. Patient not taking: Reported on 07/02/2024 06/18/24   Leak, Brandi L, NP  sertraline  (ZOLOFT ) 100 MG tablet Take 1 tablet (100 mg total) by mouth daily. Patient not taking: Reported on 07/02/2024 06/19/24   Leak, Daphne CROME, NP     Family History  Problem Relation Age of Onset   Lung cancer Mother    Hypertension Father    Colon cancer Other    CAD Neg Hx    Diabetes Neg Hx     Stroke Neg Hx     Social History   Socioeconomic History   Marital status: Married    Spouse name: Not on file   Number of children: Not on file   Years of education: Not on file   Highest education level: Not on file  Occupational History   Not on file  Tobacco Use   Smoking status: Former    Current packs/day: 0.00    Types: Cigarettes    Start date: 12/24/1970    Quit date: 12/25/1979    Years since quitting: 44.5   Smokeless tobacco: Never  Vaping Use   Vaping status: Never Used  Substance and Sexual Activity   Alcohol  use: Yes    Comment: rare   Drug use: No   Sexual activity: Yes  Other Topics Concern   Not on file  Social History Narrative   Not on file   Social Drivers of Health   Financial Resource Strain: Not on file  Food Insecurity: No Food Insecurity (07/02/2024)   Hunger Vital Sign    Worried About Running Out of Food in the Last Year: Never true    Ran Out of Food in the Last Year: Never true  Transportation Needs: No Transportation Needs (07/02/2024)   PRAPARE - Administrator, Civil Service (Medical): No    Lack of Transportation (Non-Medical): No  Physical Activity: Not on file  Stress: Not on file  Social Connections: Socially Integrated (07/02/2024)   Social Connection and Isolation Panel    Frequency of Communication with Friends and Family: More than three times a week    Frequency of Social Gatherings with Friends and Family: Three times a week    Attends Religious Services: More than 4 times per year    Active Member of Clubs or Organizations: Yes    Attends Banker Meetings: 1 to 4 times per year    Marital Status:  Married     Review of Systems: A 12 point ROS discussed and pertinent positives are indicated in the HPI above.  All other systems are negative.  Vital Signs: BP 108/72 (BP Location: Right Wrist)   Pulse (!) 152   Temp 98.2 F (36.8 C) (Oral)   Resp 18   Ht 5' 11 (1.803 m)   Wt 196 lb 3.4 oz  (89 kg)   SpO2 94%   BMI 27.37 kg/m   Advance Care Plan: The advanced care place/surrogate decision maker was discussed at the time of visit and the patient did not wish to discuss or was not able to name a surrogate decision maker or provide an advance care plan.  Physical Exam Vitals reviewed.  Constitutional:      General: He is not in acute distress.    Appearance: Normal appearance.  HENT:     Mouth/Throat:     Mouth: Mucous membranes are dry.  Cardiovascular:     Rate and Rhythm: Tachycardia present.     Heart sounds: Normal heart sounds.  Pulmonary:     Effort: Pulmonary effort is normal.     Breath sounds: Normal breath sounds.  Abdominal:     General: Abdomen is flat.     Palpations: Abdomen is soft.     Tenderness: There is abdominal tenderness.     Comments: Midline incisional wound appropriately dressed. Urostomy at RLQ appears intact. Mild generalized abdominal discomfort to palpation.   Musculoskeletal:        General: Normal range of motion.     Cervical back: Normal range of motion.  Skin:    General: Skin is warm and dry.  Neurological:     Mental Status: He is alert and oriented to person, place, and time.  Psychiatric:        Mood and Affect: Mood normal.        Behavior: Behavior normal.     Imaging: CT ABDOMEN PELVIS WO CONTRAST Result Date: 07/02/2024 CLINICAL DATA:  Suspected sepsis EXAM: CT ABDOMEN AND PELVIS WITHOUT CONTRAST TECHNIQUE: Multidetector CT imaging of the abdomen and pelvis was performed following the standard protocol without IV contrast. RADIATION DOSE REDUCTION: This exam was performed according to the departmental dose-optimization program which includes automated exposure control, adjustment of the mA and/or kV according to patient size and/or use of iterative reconstruction technique. COMPARISON:  Chest x-ray 07/01/2024, CT 06/17/2024, 05/12/2024 FINDINGS: Lower chest: Lung bases demonstrates interval nodular airspace disease and  ground-glass density in the left greater than right lung bases. No pleural effusion. Coronary vascular calcification. Hepatobiliary: No focal liver abnormality is seen. Status post cholecystectomy. No biliary dilatation. Pancreas: Chronic pancreatitis. No acute inflammation. Stable 10 mm low-density lesion at the distal pancreas, series 3, image 24. Spleen: Normal in size without focal abnormality. Adrenals/Urinary Tract: Adrenal glands are normal. Patient is status post cysto prostatectomy with right abdominal ileal conduit. Bilateral percutaneous nephrostomy catheters remain in place. Right-sided ureteral stent, extending from the renal pelvis to the right abdominal urostomy. Residual mild to moderate right-sided hydronephrosis but slightly diminished compared to most recent prior CT. Atrophic left kidney. Interval moderate severe left hydronephrosis and hydroureter, ureter dilated down to the ileal conduit, stricture was demonstrated on nephrostogram 05/11/2024. Small gas bubble in the left renal pelvis. Slight increased left perinephric stranding with mild stranding about the now dilated left ureter. Stomach/Bowel: Stomach is nondistended. No dilated small bowel. Contrast within the colon. No acute bowel wall thickening. Right abdominal ileal conduit. Separate  right abdominal ileostomy. 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 Vascular/Lymphatic: Aortic atherosclerosis. No aneurysm. No suspicious lymph nodes. Reproductive: Prostatectomy. Other: Negative for pelvic effusion. Fat containing right inguinal hernia. Drainage catheter in the subcutaneous soft tissues of the right abdominal wall near the urostomy. Decreased fluid collection within the right ventral pelvic wall, limited characterization without contrast. Sample measurement right laterally measures about 2.5 cm on series 3, image 66, previously 3.4 cm. Musculoskeletal: No acute osseous abnormality. Old superior endplate deformity at L1  IMPRESSION: 1. Interval development of moderate to severe left hydronephrosis and hydroureter, ureter dilated down to the ileal conduit, stricture was demonstrated on nephrostogram 05/11/2024. Slight increased left perinephric stranding with mild stranding about the now dilated left ureter, correlate for superimposed infection. Correlate also for function of the left percutaneous nephrostomy tube. 2. Bilateral percutaneous nephrostomy catheters remain in place. Right-sided ureteral stent, extending from the renal pelvis to the right abdominal urostomy. Residual mild to moderate right-sided hydronephrosis but slightly diminished compared to most recent prior CT. 3. Interval nodular airspace disease and ground-glass density in the left greater than right lung bases, suspect for pneumonia and or aspiration. 4. Drainage catheter in the subcutaneous soft tissues of the right abdominal wall near the urostomy. Decreased fluid collection within the right ventral pelvic wall, limited characterization without contrast. 5. Aortic atherosclerosis. Aortic Atherosclerosis (ICD10-I70.0). Electronically Signed   By: Luke Bun M.D.   On: 07/02/2024 01:57   DG Chest Port 1 View if patient is in a treatment room. Result Date: 07/01/2024 CLINICAL DATA: Suspected sepsis EXAM: PORTABLE CHEST 1 VIEW COMPARISON:  Chest radiograph June 12, 2024. FINDINGS: The heart size and mediastinal contours are within normal limits. Both lungs are clear. The visualized skeletal structures are unremarkable. IMPRESSION: No active disease. Electronically Signed   By: Megan  Zare M.D.   On: 07/01/2024 17:45   DG Loopogram Result Date: 06/17/2024 INDICATION: Patient with a history of a radical cystectomy/ileal conduit urinary diversion with a known leak of the ileal conduit with bilateral nephrostomy tubes placed 05/04/24 as well as placement of a right retrograde ureteral stent 05/14/24. Request for loopogram to assess for further evidence of urinary  leak. COMPARISON:  DG LOOPOGRAM 05/12/24 CONTRAST:  A total of 60 mL Isovue -300 administered was administered into the bilateral nephrostomy tubes as well as retrograde ureteral stent. FLUOROSCOPY TIME:  21 mGy COMPLICATIONS: None immediate. TECHNIQUE: Informed written consent was obtained from the patient after a discussion of the risks, benefits and alternatives to treatment. Questions regarding the procedure were encouraged and answered. A timeout was performed prior to the initiation of the procedure. A pre procedural spot fluoroscopic image was obtained. The bilateral flank and external portions of existing nephrostomy catheters and right ureteral stent were prepped and sterilized. Antegrade injection of 20 mL of contrast into the existing right nephrostomy catheter demonstrated appropriate positioning within the renal pelvis. After several minutes, no contrast was identified within the right ureter or ileal conduit. Antegrade injection of 20 mL of contrast into the existing left nephrostomy catheter demonstrated appropriate positioning within the renal pelvis. After several minutes, a small volume of contrast was noted to flow through the left ureter into the ileal conduit. Retrograde injection of the right ureteral stent opacified the right ureter and right renal collecting system. No contrast extravasation observed. Temporary dressing applied to the ileal conduit ostomy site. Bilateral nephrostomy tubes were capped as requested by ordering MD. The patient tolerated the procedure well without immediate postprocedural complication. FINDINGS: The existing  right nephrostomy catheter is appropriately positioned and functioning. Right retrograde ureteral stent in place through ileal conduit. IMPRESSION: 1. Right nephrostogram via the right nephrostomy tube and right ureteral stent demonstrates normally patent ureter and ureteral anastomosis with the ileal conduit. No right-sided urine leak demonstrated. 2. Left  nephrostogram through left nephrostomy tube appears in good position with evidence of contrast flow to the distal ureter. No left-sided urine leak demonstrated. Performed by: Kacie Matthews PA-C Electronically Signed   By: Ester Sides M.D.   On: 06/17/2024 16:00   CT ABDOMEN PELVIS W WO CONTRAST Result Date: 06/17/2024 CLINICAL DATA:  Abdominal pain, postop, ileal conduit leak. EXAM: CT ABDOMEN AND PELVIS WITHOUT AND WITH CONTRAST TECHNIQUE: Multidetector CT imaging of the abdomen and pelvis was performed following the standard protocol before and following the bolus administration of intravenous contrast. RADIATION DOSE REDUCTION: This exam was performed according to the departmental dose-optimization program which includes automated exposure control, adjustment of the mA and/or kV according to patient size and/or use of iterative reconstruction technique. CONTRAST:  75mL OMNIPAQUE  IOHEXOL  350 MG/ML SOLN COMPARISON:  05/12/2024. FINDINGS: Lower chest: A few tiny pulmonary nodules are unchanged from 08/29/2018 and are considered benign. Heart size normal. No pericardial effusion. No pleural effusion. Distal esophagus is grossly unremarkable. Hepatobiliary: Liver is grossly unremarkable. Cholecystectomy. No biliary ductal dilatation. Pancreas: Chronic calcific pancreatitis. 1.3 cm low-attenuation lesion in the pancreatic tail (3/21), unchanged from 08/29/2018, compatible with a pseudocyst. Spleen: Negative. Adrenals/Urinary Tract: Negative. Stomach/Bowel: Bilateral percutaneous nephrostomies with contrast in the collecting systems bilaterally, secondary to a loopogram performed prior to this study. Possible small stone in the right kidney (3/24). Persistent moderate to severe dilatation of the right intrarenal collecting system and right ureter with a double-J right ureteral stent in place, proximal loop in the right renal pelvis and distal portion the on the right lower quadrant ileal conduit. Left kidney is  atrophic and scarred. Partial opacification of the left ureter which is decompressed. Ileal conduit in the right lower quadrant which may be stenotic as it traverses the abdominal wall, as on 05/12/2024. No extraluminal contrast. Cysto prostatectomy. Vascular/Lymphatic: Stomach is unremarkable. Right lower quadrant ileostomy and separate ileal conduit. Small bowel and colon are otherwise unremarkable. Appendix is not readily visualized. Reproductive: Prostatectomy. Other: Small right inguinal hernia contains fat. Enlarging broad fluid collection along the right ventral pelvic wall, just above the ileal conduit, measuring 3.4 x 21.6 cm. A percutaneous drain is seen within. No free fluid or extraluminal contrast. Musculoskeletal: Degenerative changes in the spine. Old L1 compression fracture. IMPRESSION: 1. 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 2. Enlarging fluid collection along the right ventral pelvic abdominal wall with a percutaneous drain in place, indicative of an abscess. 3. 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 4. Right lower quadrant ileostomy. 5. Probable small right renal stone. 6. Chronic calcific pancreatitis. Electronically Signed   By: Newell Eke M.D.   On: 06/17/2024 15:47   DG Chest 2 View Result Date: 06/12/2024 CLINICAL DATA:  10031 Cough 10031 EXAM: CHEST - 2 VIEW COMPARISON:  Chest x-ray 05/26/2024, CT chest 04/14/2024 FINDINGS: The heart and mediastinal contours are within normal limits. No focal consolidation. No pulmonary edema. No pleural effusion. No pneumothorax. No acute osseous abnormality. IMPRESSION: No active cardiopulmonary disease. Electronically Signed   By: Morgane  Naveau M.D.   On: 06/12/2024 18:56    Labs:  CBC: Recent Labs    07/05/24 0239 07/06/24 0259 07/07/24 0323 07/08/24 0246  WBC 8.4 7.5 9.1 8.7  HGB 11.4* 10.3*  10.5* 10.8*  HCT 35.4* 31.7* 33.0* 32.7*  PLT 204 221 214 228    COAGS: Recent Labs    04/13/24 1047 04/14/24 1141 04/23/24 0416 04/24/24 0338 04/24/24 0601 05/04/24 1020 05/26/24 0053 07/01/24 1716  INR 1.6* 1.6*  --   --   --  1.1 1.2 1.9*  APTT 47*  --  37* 189* 167*  --   --   --     BMP: Recent Labs    07/06/24 0259 07/07/24 0323 07/08/24 0246 07/09/24 0252  NA 133* 135 138 138  K 3.8 3.4* 3.2* 3.5  CL 108 108 110 112*  CO2 19* 19* 18* 18*  GLUCOSE 91 78 87 77  BUN 46* 42* 39* 34*  CALCIUM  9.5 9.4 9.2 9.6  CREATININE 1.37* 1.42* 1.45* 1.48*  GFRNONAA 55* 53* 51* 50*    LIVER FUNCTION TESTS: Recent Labs    07/05/24 0239 07/06/24 0259 07/07/24 0323 07/09/24 0252  BILITOT 0.5 0.4 0.4 0.5  AST 14* 14* 18 21  ALT 16 15 18 25   ALKPHOS 68 60 60 73  PROT 5.4* 5.0* 4.8* 5.1*  ALBUMIN  2.0* 1.7* 1.6* 1.9*    TUMOR MARKERS: No results for input(s): AFPTM, CEA, CA199, CHROMGRNA in the last 8760 hours.  Assessment and Plan: Patient is know to IR service, having most recently undergone bilateral PCN exchange and right ureteral retrograde stent placement by Dr. Jenna on 7/2. IR was again requested for routine bilateral PCN exchange, with capping of the right PCN, as well as routine exchange of the right ureteral stent.  Patient presents for same in IR today, under moderate sedation.  Patient has been NPO since midnight.  All labs and medications are within acceptable parameters.  No pertinent allergies.   Risks and benefits of bilateral PCN and right retrograde ureteral stent exchange was discussed with the patient including, but not limited to, infection, bleeding, significant bleeding causing loss or decrease in renal function or damage to adjacent structures.   All of the patient's questions were answered, patient is agreeable to proceed.  Consent signed and in IR charge desk.   Thank you for allowing our service to participate in Kristopher Thompson 's care.  Electronically Signed: Carlin DELENA Griffon, PA-C   07/09/2024, 10:35 AM      I spent a total of 40 Minutes in face to face in clinical consultation, greater than 50% of which was counseling/coordinating care for routine exchange of bilateral PCN and right retrograde ureteral stent exchanges.

## 2024-07-09 NOTE — Plan of Care (Signed)
  Problem: Education: Goal: Knowledge of General Education information will improve Description: Including pain rating scale, medication(s)/side effects and non-pharmacologic comfort measures Outcome: Progressing   Problem: Clinical Measurements: Goal: Will remain free from infection Outcome: Progressing   Problem: Clinical Measurements: Goal: Diagnostic test results will improve Outcome: Progressing   Problem: Clinical Measurements: Goal: Respiratory complications will improve Outcome: Progressing   Problem: Clinical Measurements: Goal: Cardiovascular complication will be avoided Outcome: Progressing   Problem: Activity: Goal: Risk for activity intolerance will decrease Outcome: Progressing   Problem: Nutrition: Goal: Adequate nutrition will be maintained Outcome: Progressing

## 2024-07-09 NOTE — Progress Notes (Signed)
 Pharmacy Antibiotic Note  Kristopher Thompson is a 73 y.o. male admitted on 07/01/2024 with intra- abdominal wall infection.  Pharmacy has been consulted for vancomycin  dosing.  Vancomycin  trough (~24hr post dose) = 39 mcg/ml  Plan: Continue to hold vancomycin  Will plan to recheck vancomycin  random level in the next 24-36hrs Monitor renal function closely  Height: 5' 11 (180.3 cm) Weight: 89 kg (196 lb 3.4 oz) IBW/kg (Calculated) : 75.3  Temp (24hrs), Avg:98.1 F (36.7 C), Min:97.8 F (36.6 C), Max:98.4 F (36.9 C)  Recent Labs  Lab 07/03/24 0835 07/04/24 0304 07/05/24 0239 07/05/24 2142 07/06/24 0259 07/07/24 0323 07/08/24 0246 07/08/24 1749 07/08/24 2056 07/09/24 0252 07/09/24 1557  WBC 8.0 7.5 8.4  --  7.5 9.1 8.7  --   --   --   --   CREATININE 2.12* 1.83* 1.52*  --  1.37* 1.42* 1.45*  --   --  1.48*  --   LATICACIDVEN  --   --   --  1.0  --   --   --   --   --   --   --   VANCOTROUGH  --   --   --   --   --   --   --   --   --   --  39*  VANCOPEAK  --   --   --   --   --   --   --  >60*  --   --   --   STEPHANI 18  --   --   --   --   --   --   --  55*  --   --     Estimated Creatinine Clearance: 48.1 mL/min (A) (by C-G formula based on SCr of 1.48 mg/dL (H)).    Allergies  Allergen Reactions   Demerol  [Meperidine ] Nausea And Vomiting    Antimicrobials this admission: Vancomycin  8/21 >  Cefepime  8/20>8/25 Ceftrix per ID 8/25>  Dose adjustments this admission: 8/22 VR 18 at 0835 - ~40 hrs after LD but AKI > improving > maint dose begun 1gm q24h 8/26: SCr improved to 1.37>>1gm > 1250 mg IV q24h for eAUC 406 8/27 VP 60 but drawn during infusion; repeat 52 - vancomycin  held 8/28 VT 39 - continue to hold vancomycin   Microbiology results: 8/20 blood: neg 8/20 urine: >100K/ml MRSA (MIC to Vanc 1; also sens TCN and linezolid  but R to cipro  and septra ) + >100K/ml Enterococcus faecium (MIC to Vanc < 0.5) 8/21 JP drainage (RLQ): abundant E coli (send Cefepime ,  Erta, CRO, Mero, Zosyn ) and Corynebacterium  Thank you for allowing pharmacy to be a part of this patient's care.  Rocky Slade, PharmD, BCPS 07/09/2024 5:27 PM

## 2024-07-09 NOTE — Treatment Plan (Signed)
 Cr continues to be stable with right nephrostomy tube capped. Will plan to still exchange bilateral nephrostomy tubes with VIR and cap right side post-procedurally, as discussed with their team.   Kristopher GEANNIE Civil, MD Resident, PGY4 Department of Urology

## 2024-07-09 NOTE — Progress Notes (Signed)
 Physical Therapy Treatment Patient Details Name: Kristopher Thompson MRN: [id number] DOB: 11-03-1951 Today's Date: 07/09/2024   History of Present Illness 73 y/o male admitted 07/01/24 from home with new onset of weakness, muffled voice, Lt hydronephrosis, obstructive renal failure and PNA. PMHx: Admit 5/30-7/11 for hernia repair with bowel perforation and DC to snf, readmit 7/14-7/16 with D/C to CIR 7/16-8/8. HTN, dCHF, CAD, DM2, PE on Eliquis , anemia, obesity, prostate and bladder CA, hernia repair, urostomy, colostomy, RBBB    PT Comments  Pt pleasant, slow to respond and willing to get OOB. Pt reports fatigue and weakness with ability to step to chair but with decreased foot clearance and could not weight shift on 2nd standing trial. Pt educated for HEP, transfers and progression, Will continue to follow.  Patient will benefit from intensive inpatient follow-up therapy, >3 hours/day  HR max 152 with stand pivot, 135 with standing, 120 with LB HEP At rest 101   If plan is discharge home, recommend the following: A lot of help with walking and/or transfers;A lot of help with bathing/dressing/bathroom;Assist for transportation;Help with stairs or ramp for entrance   Can travel by private vehicle        Equipment Recommendations  None recommended by PT    Recommendations for Other Services       Precautions / Restrictions Precautions Precautions: Fall;Other (comment) Recall of Precautions/Restrictions: Impaired Precaution/Restrictions Comments: urostomy, colostomy, lt PCN drain, watch BP and HR     Mobility  Bed Mobility Overal bed mobility: Needs Assistance Bed Mobility: Rolling, Sidelying to Sit Rolling: Max assist, Used rails Sidelying to sit: Mod assist, +2 for physical assistance       General bed mobility comments: max assist to roll right this session after ted hose donned with mod assist to clear legs off bed and elevate trunk to sitting. mod assist to scoot to EOB     Transfers Overall transfer level: Needs assistance Equipment used: Rolling walker (2 wheels) Transfers: Sit to/from Stand Sit to Stand: Mod assist, +2 physical assistance Stand pivot transfers: Mod assist, +2 safety/equipment         General transfer comment: mod +2 assist to rise from bed with axillary assist and cues for anterior weight shift and hip extension. Pt able to step with shuffling feet bed to chair with use of RW and mod assist +2 for safety. repeated stand from chair with mod assist to scoot to edge and min +2 to stand from surface, unable to march in standing and stood grossly 15 sec    Ambulation/Gait                   Stairs             Wheelchair Mobility     Tilt Bed    Modified Rankin (Stroke Patients Only)       Balance Overall balance assessment: Needs assistance Sitting-balance support: Feet supported, No upper extremity supported Sitting balance-Leahy Scale: Fair Sitting balance - Comments: CGA   Standing balance support: Bilateral upper extremity supported, Reliant on assistive device for balance, During functional activity Standing balance-Leahy Scale: Poor Standing balance comment: Rw and assist in standing                            Communication Communication Communication: Impaired Factors Affecting Communication: Hearing impaired  Cognition Arousal: Alert Behavior During Therapy: Flat affect   PT - Cognitive impairments: Difficult to assess, Safety/Judgement, Problem  solving                       PT - Cognition Comments: pt without hearing aids, slow processing Following commands: Impaired Following commands impaired: Follows one step commands with increased time, Follows one step commands inconsistently    Cueing Cueing Techniques: Verbal cues, Gestural cues  Exercises General Exercises - Lower Extremity Long Arc Quad: Both, Seated, Strengthening, AAROM, 15 reps Hip Flexion/Marching: Both,  Strengthening, 15 reps, Seated, AAROM Heel Raises: AROM, Both, 20 reps, Seated, Strengthening    General Comments        Pertinent Vitals/Pain Pain Assessment Pain Assessment: No/denies pain    Home Living                          Prior Function            PT Goals (current goals can now be found in the care plan section) Progress towards PT goals: Progressing toward goals    Frequency    Min 3X/week      PT Plan      Co-evaluation              AM-PAC PT 6 Clicks Mobility   Outcome Measure  Help needed turning from your back to your side while in a flat bed without using bedrails?: A Lot Help needed moving from lying on your back to sitting on the side of a flat bed without using bedrails?: A Lot Help needed moving to and from a bed to a chair (including a wheelchair)?: Total Help needed standing up from a chair using your arms (e.g., wheelchair or bedside chair)?: Total Help needed to walk in hospital room?: Total Help needed climbing 3-5 steps with a railing? : Total 6 Click Score: 8    End of Session Equipment Utilized During Treatment: Oxygen (thigh high ted hose) Activity Tolerance: Patient tolerated treatment well Patient left: in chair;with call bell/phone within reach;with chair alarm set;with family/visitor present Nurse Communication: Mobility status PT Visit Diagnosis: Muscle weakness (generalized) (M62.81);Other abnormalities of gait and mobility (R26.89);Other symptoms and signs involving the nervous system (R29.898)     Time: 9163-9095 PT Time Calculation (min) (ACUTE ONLY): 28 min  Charges:    $Therapeutic Exercise: 8-22 mins $Therapeutic Activity: 8-22 mins PT General Charges $$ ACUTE PT VISIT: 1 Visit                     [name], PT Acute Rehabilitation Services Office: [phone]    Lenoard NOVAK Boluwatife Mutchler 07/09/2024, 10:25 AM

## 2024-07-09 NOTE — Plan of Care (Signed)
   Problem: Education: Goal: Knowledge of General Education information will improve Description: Including pain rating scale, medication(s)/side effects and non-pharmacologic comfort measures Outcome: Progressing   Problem: Clinical Measurements: Goal: Will remain free from infection Outcome: Progressing

## 2024-07-10 DIAGNOSIS — N179 Acute kidney failure, unspecified: Secondary | ICD-10-CM | POA: Diagnosis not present

## 2024-07-10 DIAGNOSIS — C679 Malignant neoplasm of bladder, unspecified: Secondary | ICD-10-CM | POA: Diagnosis not present

## 2024-07-10 DIAGNOSIS — R69 Illness, unspecified: Secondary | ICD-10-CM | POA: Diagnosis not present

## 2024-07-10 DIAGNOSIS — A419 Sepsis, unspecified organism: Secondary | ICD-10-CM | POA: Diagnosis not present

## 2024-07-10 NOTE — Plan of Care (Signed)
  Problem: Health Behavior/Discharge Planning: Goal: Ability to manage health-related needs will improve Outcome: Progressing   Problem: Clinical Measurements: Goal: Ability to maintain clinical measurements within normal limits will improve Outcome: Progressing   Problem: Pain Managment: Goal: General experience of comfort will improve and/or be controlled Outcome: Progressing   Problem: Safety: Goal: Ability to remain free from injury will improve Outcome: Progressing   Problem: Skin Integrity: Goal: Risk for impaired skin integrity will decrease Outcome: Progressing

## 2024-07-10 NOTE — TOC Progression Note (Signed)
 Transition of Care [facility]) - Progression Note    Patient Details  Name: JERMARCUS MCFADYEN MRN: [id number] Date of Birth: May 27, 1951  [facility] CM/SW Contact  Waddell [name] Rama, RN Phone Number: 07/10/2024, 2:50 PM  Clinical Narrative:    This NCM notified [facility] that patient is going to CIR at dc.                     Expected Discharge Plan and Services                                               Social Drivers of Health (SDOH) Interventions SDOH Screenings   Food Insecurity: No Food Insecurity (07/02/2024)  Housing: Low Risk  (07/02/2024)  Transportation Needs: No Transportation Needs (07/02/2024)  Utilities: Not At Risk (07/02/2024)  Recent Concern: Utilities - At Risk (05/26/2024)  Social Connections: Socially Integrated (07/02/2024)  Tobacco Use: Medium Risk (07/09/2024)    Readmission Risk Interventions    05/26/2024    8:59 AM 04/12/2024    4:58 PM  Readmission Risk Prevention Plan  Transportation Screening Complete Complete  PCP or Specialist Appt within 5-7 Days Complete   PCP or Specialist Appt within 3-5 Days  Complete  Home Care Screening Complete   Medication Review (RN CM) Complete   HRI or Home Care Consult  Complete  Social Work Consult for Recovery Care Planning/Counseling  Complete  Palliative Care Screening  Not Applicable  Medication Review [employer])  Complete

## 2024-07-10 NOTE — Progress Notes (Signed)
 [facility]  PROGRESS NOTE  Kristopher Thompson FMW:[id number] DOB: 06-Nov-1951 DOA: 07/01/2024 PCP: [name], MD   Brief HPI:   73 year old with complicated history of HTN, CHFpEF, CAD, T2DM, PE on Eliquis , prostate cancer, bladder cancer who was admitted back in late May for incarcerated incisional hernia and urostomy ileal conduit revision. Postoperative course was complicated by bowel perforation, sepsis, ICU admission with intubation, requiring pressors and CRRT. Patient had bilateral nephrostomy tubes placed as well and had been in inpatient rehab until August 8 of this year. At that time a study was done that supported capping the nephrostomy tubes. Patient was sent home and was doing well including ambulating on his own, was tolerating p.o., had good urine output until approximately 3 days ago. reports decreased urine output from about a liter a day to down to 300 in the last 24 hours. Found to have aki and possible pyelonephritis.     Assessment/Plan:   severe sepsis-infected abdominal wall -Sepsis physiology has resolved -Presented with sinus tachycardia, encephalopathy, pyonephritis -Started on broad-spectrum antibiotics -Etiology infected abdominal wall with purulent material in the drain, this was removed by general surgery on 8/21 culture tip noted abundant E. coli and Corynebacterium, E. coli was resistant to ampicillin, cefazolin , Cipro  and Bactrim . -ID consulted, plan to continue  ceftriaxone  and vancomycin  Plan 3-6 months treatment  6 weeks iv vanc, ceftriaxone   6 more weeks ?dalbavancin vs transitioning to tedezolid/linezolid ; ceftriaxone  -> ?omado pending susceptibility   Acute kidney injury - Creatinine 3.57 on presentation, markedly above baseline 1-1.3.  Multi factorial etiology most likely secondary to ongoing urinary obstruction.  Percutaneous nephrostomy tubes uncapped and IV fluids on board.  Appears to be resolved at this time after IV fluid hydration.    Creatinine improved to 1.48   Concern for urinary obstruction with L>R hydronephrosis - Nephrostomy tubes uncapped.  Evaluated by urology and following closely.  Urine output excellent bilaterally.  Per urology, right nephrostomy tube capped yesterday.  Very slight bump in urine creatinine, so watching kidney function closely.   -Underwent exchange of bilateral nephrostomy tubes today   Bladder cancer s/p radical cystectomy and ileal conduit - Initial surgery 07/2017, subsequent lower complicated course after with urine leak, ureteral anastomosis, parastomal hernia incarceration, bowel perforation with abscess, creation of end ileostomy, and hydronephrosis requiring bilateral nephrostomy tubes.  Nephrostomy tubes initially capped as urine output was good.  However given AKI and decreased urine output, now uncapped.  Evaluation by urology, general surgery.  No concern for urine leak.  Previous abdominal wall drain for bowel perforation and abscess removed by general surgery 8/21.  Culture noting abundant e. Coli and Corynebacterium E. coli resistant to ampicillin, cefazolin , Cipro , Bactrim .  Remains on ceftriaxone , vancomycin .  Treatment duration as above.   ?  Adrenal insufficiency - Noted mild hyponatremia with soft blood pressures.  Patient on fludrocortisone  p.o. received one-time dose of hydrocortisone  100 mg IV with minimal response.  Blood pressure appears stable.  ? pyelonephritis - Leukocytosis, stranding noted on CT scan.  Continues on cefepime  and vanc.  Empiric urine cultures positive for MRSA as well as Enterococcus.   Obstructive sleep apnea - CPAP.   Chronic anticoagulation - Eliquis  on board  Hypokalemia - Replete   Physical debilitation muscle weakness - Evaluated by PT/OT.  Multiple hospitalizations recently.  Likely transition back to CIR.  Working closely with case management on disposition planning.      Medications     apixaban   5 mg Oral BID  Chlorhexidine   Gluconate Cloth  6 each Topical Daily   cyanocobalamin   500 mcg Oral Q M,W,F   cyproheptadine   4 mg Oral QHS   feeding supplement  237 mL Oral BID BM   fludrocortisone   0.2 mg Oral Daily   Gerhardt's butt cream  1 Application Topical BID   leptospermum manuka honey  1 Application Topical Daily   loperamide   4 mg Oral TID   melatonin  3 mg Oral QHS   metoCLOPramide   5 mg Oral TID AC   midodrine   15 mg Oral TID WC   multivitamin with minerals  1 tablet Oral Daily   nutrition supplement (JUVEN)  1 packet Oral BID BM   omega-3 acid ethyl esters  1 g Oral QODAY   mouth rinse  15 mL Mouth Rinse 4 times per day   psyllium  1 packet Oral Daily   rosuvastatin   10 mg Oral QPM   sertraline   100 mg Oral Daily   sodium chloride  flush  5 mL Intracatheter Q8H   vancomycin  variable dose per unstable renal function (pharmacist dosing)   Does not apply See admin instructions     Data Reviewed:   CBG:  Recent Labs  Lab 07/05/24 0815  GLUCAP 92    SpO2: 97 % O2 Flow Rate (L/min): 2 L/min FiO2 (%): 21 %    Vitals:   07/10/24 0000 07/10/24 0400 07/10/24 0715 07/10/24 1104  BP: 121/70 103/62 109/62 128/78  Pulse: 79 78 80 94  Resp: 17 18 18 18   Temp: 98.5 F (36.9 C) 98.4 F (36.9 C) 98.2 F (36.8 C) 98.9 F (37.2 C)  TempSrc: Oral Oral Axillary Oral  SpO2: 98% 98% 98% 97%  Weight:  88.4 kg    Height:          Data Reviewed:  Basic Metabolic Panel: Recent Labs  Lab 07/04/24 0304 07/05/24 0239 07/06/24 0259 07/07/24 0323 07/08/24 0246 07/09/24 0252  NA 132* 131* 133* 135 138 138  K 3.4* 4.2 3.8 3.4* 3.2* 3.5  CL 103 103 108 108 110 112*  CO2 21* 21* 19* 19* 18* 18*  GLUCOSE 87 88 91 78 87 77  BUN 70* 53* 46* 42* 39* 34*  CREATININE 1.83* 1.52* 1.37* 1.42* 1.45* 1.48*  CALCIUM  9.3 9.4 9.5 9.4 9.2 9.6  MG 2.0 1.8 1.8 1.7  --   --   PHOS  --   --  1.8* 2.2*  --  2.5    CBC: Recent Labs  Lab 07/04/24 0304 07/05/24 0239 07/06/24 0259 07/07/24 0323 07/08/24 0246   WBC 7.5 8.4 7.5 9.1 8.7  HGB 11.3* 11.4* 10.3* 10.5* 10.8*  HCT 33.6* 35.4* 31.7* 33.0* 32.7*  MCV 83.0 84.3 84.3 85.9 84.5  PLT 189 204 221 214 228    LFT Recent Labs  Lab 07/04/24 0304 07/05/24 0239 07/06/24 0259 07/07/24 0323 07/09/24 0252  AST 16 14* 14* 18 21  ALT 17 16 15 18 25   ALKPHOS 67 68 60 60 73  BILITOT 0.4 0.5 0.4 0.4 0.5  PROT 5.1* 5.4* 5.0* 4.8* 5.1*  ALBUMIN  2.0* 2.0* 1.7* 1.6* 1.9*     Antibiotics: Anti-infectives (From admission, onward)    Start     Dose/Rate Route Frequency Ordered Stop   07/09/24 0806  vancomycin  variable dose per unstable renal function (pharmacist dosing)         Does not apply See admin instructions 07/09/24 0806     07/07/24 0000  cefTRIAXone  (ROCEPHIN ) 2 g  in sodium chloride  0.9 % 100 mL IVPB        2 g 200 mL/hr over 30 Minutes Intravenous Every 24 hours 07/06/24 2249     07/06/24 1600  vancomycin  (VANCOREADY) IVPB 1250 mg/250 mL  Status:  Discontinued        1,250 mg 166.7 mL/hr over 90 Minutes Intravenous Every 24 hours 07/06/24 1502 07/09/24 0806   07/03/24 1600  vancomycin  (VANCOCIN ) IVPB 1000 mg/200 mL premix  Status:  Discontinued        1,000 mg 200 mL/hr over 60 Minutes Intravenous Every 24 hours 07/03/24 1422 07/06/24 1502   07/03/24 1600  ceFEPIme  (MAXIPIME ) 2 g in sodium chloride  0.9 % 100 mL IVPB  Status:  Discontinued        2 g 200 mL/hr over 30 Minutes Intravenous Every 12 hours 07/03/24 1423 07/06/24 2249   07/03/24 1345  linezolid  (ZYVOX ) IVPB 600 mg  Status:  Discontinued        600 mg 300 mL/hr over 60 Minutes Intravenous Every 12 hours 07/03/24 1252 07/03/24 1306   07/02/24 1800  ceFEPIme  (MAXIPIME ) 2 g in sodium chloride  0.9 % 100 mL IVPB  Status:  Discontinued        2 g 200 mL/hr over 30 Minutes Intravenous Every 24 hours 07/02/24 0323 07/03/24 1423   07/02/24 0345  vancomycin  (VANCOREADY) IVPB 2000 mg/400 mL        2,000 mg 200 mL/hr over 120 Minutes Intravenous  Once 07/02/24 0324 07/02/24 0600    07/01/24 2030  ceFEPIme  (MAXIPIME ) 2 g in sodium chloride  0.9 % 100 mL IVPB        2 g 200 mL/hr over 30 Minutes Intravenous  Once 07/01/24 2020 07/01/24 2308        DVT prophylaxis: Apixaban   Code Status: Full code  Family Communication: Discussed with patient's wife at bedside   CONSULTS urology, IR, ID, general surgery   Subjective   Denies any complaints.   Objective    Physical Examination:  General-appears in no acute distress Heart-S1-S2, regular, no murmur auscultated Lungs-clear to auscultation bilaterally, no wheezing or crackles auscultated Abdomen-soft, nontender, no organomegaly Extremities-no edema in the lower extremities Neuro-alert, oriented x3, no focal deficit noted  Status is: Inpatient:             Sabas GORMAN Brod   [facility] If 7PM-7AM, please contact night-coverage at [email or web address], Office  [phone]   07/10/2024, 4:39 PM  LOS: 9 days

## 2024-07-10 NOTE — Progress Notes (Signed)
 Subjective: Patient doing well this AM. Right neph tube remains capped. Urine output charted as minimal, though large amount of urine seen in bag. Cr pending this AM.   Objective: Vital signs in last 24 hours: Temp:  [98 F (36.7 C)-98.5 F (36.9 C)] 98.4 F (36.9 C) (08/29 0400) Pulse Rate:  [78-152] 78 (08/29 0400) Resp:  [16-22] 18 (08/29 0400) BP: (103-130)/(62-89) 103/62 (08/29 0400) SpO2:  [97 %-100 %] 98 % (08/29 0400) Weight:  [88.4 kg] 88.4 kg (08/29 0400)  Intake/Output from previous day: 08/28 0701 - 08/29 0700 In: 477 [P.O.:177; IV Piggyback:300] Out: 300 [Urine:300] Intake/Output this shift: No intake/output data recorded.  Physical Exam:  General: Alert and oriented to self  CV: RRR Lungs: Clear Abdomen: Soft, ND, ATTP; urostomy device in place with stent seen extruding  GU: Right nephrostomy tube capped. Left neph tube in place draining pink tinged urine.   Lab Results: Recent Labs    07/08/24 0246  HGB 10.8*  HCT 32.7*   BMET Recent Labs    07/08/24 0246 07/09/24 0252  NA 138 138  K 3.2* 3.5  CL 110 112*  CO2 18* 18*  GLUCOSE 87 77  BUN 39* 34*  CREATININE 1.45* 1.48*  CALCIUM  9.2 9.6     Studies/Results: IR NEPHROSTOMY EXCHANGE BILATERAL Result Date: 07/09/2024 INDICATION: The patient has a complicated urologic history with current bilateral percutaneous nephrostomy tubes and retrograde right-sided ureteral stent. Plan is to exchange 3 catheters and at the end of the procedure capping the right percutaneous nephrostomy tube. EXAM: Bilateral percutaneous nephrostomy tube exchange and right-sided retrograde ureteral stent exchange COMPARISON:  None Available. ANESTHESIA/SEDATION: Moderate (conscious) sedation was employed during this procedure. A total of Versed  see epic record mg and Fentanyl  see epic record mcg was administered intravenously by the radiology nurse. Total intra-service moderate Sedation Time: See epic record minutes. The  patient's level of consciousness and vital signs were monitored continuously by radiology nursing throughout the procedure under my direct supervision. CONTRAST:  See epic record-administered into the collecting system(s) FLUOROSCOPY: Radiation Exposure Index (as provided by the fluoroscopic device): 25.5 mGy Kerma COMPLICATIONS: None immediate. PROCEDURE: Informed written consent was obtained from the patient after a thorough discussion of the procedural risks, benefits and alternatives. All questions were addressed. Maximal Sterile Barrier Technique was utilized including caps, mask, sterile gowns, sterile gloves, sterile drape, hand hygiene and skin antiseptic. A timeout was performed prior to the initiation of the procedure. A nearly prone position, the patient was prepped and draped in usual sterile fashion. Initially dilute contrast was injected in the retrograde right-sided ureteral stent in order to evaluate catheter position. Contrast can be seen in the dilated ureter and the catheter is in good position. The catheter was cut and a stiff Glidewire was advanced under fluoroscopic guidance into the renal pelvis. Catheter was then removed over the guidewire and a new 10 French cook pigtail catheter was advanced into the renal collecting system. Locking mechanism was engaged. Dilute contrast was then injected into the right percutaneous nephrostomy tube. Catheter is in good position in the renal pelvis on the right side. Catheter and retention suture were cut. A stiff Glidewire was advanced under fluoroscopic guidance. Catheter was. A new 10 French pigtail catheter was then advanced over the guidewire and coiled within renal pelvis. Locking mechanism was engaged. Retention suture was engaged and sterile dressing was applied. The right-sided percutaneous nephrostomy tube was then capped. Dilute contrast was then injected into the left percutaneous nephrostomy tube  demonstrating the catheter to be coiled within  the renal pelvis. The retention suture and catheter were then cut. A stiff Glidewire was then advanced under fluoroscopic guidance into the catheter and coiled within the renal pelvis. The catheter was completely removed from the patient. A new 10 French percutaneous nephrostomy tube was then advanced over the guidewire and coiled within the renal pelvis. Guidewire was removed. Retention suture and sterile dressing were applied. This catheter was connected to gravity drainage. IMPRESSION: Satisfactory exchange of bilateral percutaneous nephrostomy tubes as well as the retrograde right-sided ureteral stent. The patient will return for routine exchanges in approximately 2 months. The right percutaneous nephrostomy is capped and if trial is successful this catheter will likely be removed. I would recommend this catheter be removed under fluoroscopic guidance in order to ensure no malpositioning of the right-sided ureteral stent during the removal process. Electronically Signed   By: Cordella Banner   On: 07/09/2024 13:58   IR EXT NEPHROURETERAL CATH EXCHANGE Result Date: 07/09/2024 INDICATION: The patient has a complicated urologic history with current bilateral percutaneous nephrostomy tubes and retrograde right-sided ureteral stent. Plan is to exchange 3 catheters and at the end of the procedure capping the right percutaneous nephrostomy tube. EXAM: Bilateral percutaneous nephrostomy tube exchange and right-sided retrograde ureteral stent exchange COMPARISON:  None Available. ANESTHESIA/SEDATION: Moderate (conscious) sedation was employed during this procedure. A total of Versed  see epic record mg and Fentanyl  see epic record mcg was administered intravenously by the radiology nurse. Total intra-service moderate Sedation Time: See epic record minutes. The patient's level of consciousness and vital signs were monitored continuously by radiology nursing throughout the procedure under my direct supervision.  CONTRAST:  See epic record-administered into the collecting system(s) FLUOROSCOPY: Radiation Exposure Index (as provided by the fluoroscopic device): 25.5 mGy Kerma COMPLICATIONS: None immediate. PROCEDURE: Informed written consent was obtained from the patient after a thorough discussion of the procedural risks, benefits and alternatives. All questions were addressed. Maximal Sterile Barrier Technique was utilized including caps, mask, sterile gowns, sterile gloves, sterile drape, hand hygiene and skin antiseptic. A timeout was performed prior to the initiation of the procedure. A nearly prone position, the patient was prepped and draped in usual sterile fashion. Initially dilute contrast was injected in the retrograde right-sided ureteral stent in order to evaluate catheter position. Contrast can be seen in the dilated ureter and the catheter is in good position. The catheter was cut and a stiff Glidewire was advanced under fluoroscopic guidance into the renal pelvis. Catheter was then removed over the guidewire and a new 10 French cook pigtail catheter was advanced into the renal collecting system. Locking mechanism was engaged. Dilute contrast was then injected into the right percutaneous nephrostomy tube. Catheter is in good position in the renal pelvis on the right side. Catheter and retention suture were cut. A stiff Glidewire was advanced under fluoroscopic guidance. Catheter was. A new 10 French pigtail catheter was then advanced over the guidewire and coiled within renal pelvis. Locking mechanism was engaged. Retention suture was engaged and sterile dressing was applied. The right-sided percutaneous nephrostomy tube was then capped. Dilute contrast was then injected into the left percutaneous nephrostomy tube demonstrating the catheter to be coiled within the renal pelvis. The retention suture and catheter were then cut. A stiff Glidewire was then advanced under fluoroscopic guidance into the catheter and  coiled within the renal pelvis. The catheter was completely removed from the patient. A new 10 French percutaneous nephrostomy tube was then  advanced over the guidewire and coiled within the renal pelvis. Guidewire was removed. Retention suture and sterile dressing were applied. This catheter was connected to gravity drainage. IMPRESSION: Satisfactory exchange of bilateral percutaneous nephrostomy tubes as well as the retrograde right-sided ureteral stent. The patient will return for routine exchanges in approximately 2 months. The right percutaneous nephrostomy is capped and if trial is successful this catheter will likely be removed. I would recommend this catheter be removed under fluoroscopic guidance in order to ensure no malpositioning of the right-sided ureteral stent during the removal process. 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 Admitted for UTI/AKI, and dehydration.    1) UTI/AKI/dehydration - likely multifactorial due to UTI, dehydration, and possibly a component of obstruction.  Initially managed with putting BL PCNs to drainage. Right nephrostomy tube capped on 8/25 with Cr initially slightly uptrending, though stable now.  S/p exchange of BL neph tubes and indwelling R stent. Right neph tube remains capped.  -We will continue to observe if fevers or significant worsening kidney function will uncap right nephrostomy tube. -Please ensure that ostomy device is placed over urostomy site and that the stent is uncapped and allowed to drain. This was reviewed with nursing at bedside today.  - Please chart I&Os, including urine output from left neph tube.   2) Urine leak: Resolved.  Abdominal drain removed per Dr. Sheldon and I agree. Culture from drain with G+ rods and E.  Coli. Ucx with MRSA and Enterococcus. On Vanc/Cefepime . Defer abx to gen surg. ID following, will be on long-term antibiotics.    3) Right drain site leakage: improving  -ID following    LOS: 9 days   Lyle GEANNIE Civil, MD Resident, PGY4 Department of Urology

## 2024-07-10 NOTE — TOC Progression Note (Signed)
 Transition of Care [facility]) - Progression Note    Patient Details  Name: Kristopher Thompson MRN: [id number] Date of Birth: 1951/05/25  [facility] CM/SW Contact  Luise JAYSON Pan, [place] Phone Number: 07/10/2024, 1:53 PM  Clinical Narrative:    CIR following and can offer bed tomorrow.                       Expected Discharge Plan and Services                                               Social Drivers of Health (SDOH) Interventions SDOH Screenings   Food Insecurity: No Food Insecurity (07/02/2024)  Housing: Low Risk  (07/02/2024)  Transportation Needs: No Transportation Needs (07/02/2024)  Utilities: Not At Risk (07/02/2024)  Recent Concern: Utilities - At Risk (05/26/2024)  Social Connections: Socially Integrated (07/02/2024)  Tobacco Use: Medium Risk (07/09/2024)    Readmission Risk Interventions    05/26/2024    8:59 AM 04/12/2024    4:58 PM  Readmission Risk Prevention Plan  Transportation Screening Complete Complete  PCP or Specialist Appt within 5-7 Days Complete   PCP or Specialist Appt within 3-5 Days  Complete  Home Care Screening Complete   Medication Review (RN CM) Complete   HRI or Home Care Consult  Complete  Social Work Consult for Recovery Care Planning/Counseling  Complete  Palliative Care Screening  Not Applicable  Medication Review Oceanographer)  Complete

## 2024-07-10 NOTE — H&P (Shared)
 Physical Medicine and Rehabilitation Admission H&P    Chief Complaint  Patient presents with   Functional deficits due to debility.    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 He was readmitted on 07/01/24 with fatigue, MS changes, tachycardia with hypotension,a acute on chronic renal failure with rise in SCr to 3.57 and leucocytosis with WBC 17. He was found to have purulent drainage from abdominal wall drain and started on broad spectrum antibiotics due to concerns of RLL aspiration PNA and acute pyleonephritis ad CT abdomen pelvis revealed left hydronephrosis and hydroureter.  Nephrostomy tubes uncapped by urology. BC X 2 negative. UCS showed MRSA and E faecium and JP drain culture positive for E coli and Corynebacterium species. Dr. Macel consulted for input on renal failure with hyponatremia, metabolic acidosis and hypercalcemia felt to be due to  obstruction with poor intake and dehydration. He improved with LR for hydration and Dr. Shane following for input. Abdominal wall drain removed on 08/21 by Dr. Sheldon due to concerns of source of infection and imodium  and fiber added to thicken watery ileostomy effluent.   Dr. Overton consulted for input and recommends  6 weeks IV Vanc/Ceftriaxone  followed by oral suppression f to cover Cornyebacterium and E coli if possible for 6 months total of antibiotics. As SCr improved and right  nephrostomy tube clamped on 08/25. On 08/27, patient noted to be more confused and slight uptrend in SCr noted. He underwent exchange of bilateral PCN tube and replacement of right ureteral stent by Dr. Jenna. Right nephrostomy tube remains capped but large amount of urine noted in urostomy bag. Drainage from prior right drain site improving. Hypotension treated with IV hydrocortisone  with minimal response and BP remains soft. PT/OT consulted and working with patient who is limited by debility as ell as HR up to 130-150 with SPT tranfers. He requires mod assist  to max assist for pregait activity and able to stand for a few seconds. He was also noted to have cognitive deficits needing increased time to process, has decreased verbal output and able to follow simple one step command with verbal/gestural cues to perform ADLs.      ROS   Past Medical History:  Diagnosis Date   At risk for sleep apnea    12-25-2017   STOP-BANG SCORE= 5   --- SENT TO PCP   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   moderate atypia - right low back   Atypical nevus 04/04/2007   moderate to marked - right upper back (wider shave)   Atypical nevus 04/04/2007   moderate atypia - center chest (wider shave)   Atypical nevus 04/04/2007   slight atypia - right thigh   Atypical nevus 11/29/2011   mild atypia - center upper back   Atypical nevus 11/29/2011   mild atypia - center chest   Bacteremia due to Klebsiella pneumoniae 10/09/2017   Bladder cancer (HCC) dx 07/2017   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 denies   Sleep apnea    cpap   Squamous cell carcinoma of skin 05/22/2013   left cheek - CX3 + 5FU   Wears glasses     Past Surgical History:  Procedure Laterality Date   ABDOMINAL SURGERY     APPENDECTOMY  1972   BOWEL RESECTION N/A 04/20/2024   Procedure: SMALL BOWEL RESECTION;  Surgeon: [name], MD;  Location: [facility];  Service: General;  Laterality: N/A;   CHOLECYSTECTOMY  1985   COLONOSCOPY N/A 06/29/2021   Procedure: COLONOSCOPY;  Surgeon: Debby Hila, MD;  [facility];  Service: Endoscopy;  Laterality: N/A;   COLOSTOMY REVERSAL N/A 01/08/2018   Procedure: COLOSTOMY REVERSAL;  Surgeon: Debby Hila, MD;  [facility];  Service: General;  Laterality: N/A;   CYSTOSCOPY WITH RETROGRADE PYELOGRAM, URETEROSCOPY AND STENT PLACEMENT Right 06/10/2017   Procedure: CYSTOSCOPY WITH RIGHT URETEROSCOPY WITH RIGHT STENT PLACEMENT;  Surgeon: [name], MD;  [facility];  Service: Urology;  Laterality: Right;   EUS N/A 04/16/2018   Procedure: FULL UPPER ENDOSCOPIC ULTRASOUND (EUS) RADIAL;  Surgeon: Burnette Fallow, MD;  [facility];  Service: Endoscopy;  Laterality: N/A;   FLEXIBLE SIGMOIDOSCOPY N/A 12/13/2017   Procedure: FLEXIBLE SIGMOIDOSCOPY;  Surgeon: Debby Hila, MD;  [facility];  Service: Endoscopy;  Laterality: N/A;   ILEO CONDUIT     IR CATHETER TUBE CHANGE  12/13/2017   IR CATHETER TUBE CHANGE  01/17/2018   IR CATHETER TUBE CHANGE  02/28/2018   IR CATHETER TUBE CHANGE  07/18/2018   IR CATHETER TUBE CHANGE  08/22/2018   IR CATHETER TUBE CHANGE  10/31/2018   IR CONVERT LEFT NEPHROSTOMY TO NEPHROURETERAL CATH  10/24/2017   IR EXT NEPHROURETERAL CATH EXCHANGE  05/19/2018   IR EXT NEPHROURETERAL CATH EXCHANGE  06/16/2018   IR EXT NEPHROURETERAL CATH EXCHANGE  07/09/2024   IR NEPHRO TUBE REMOV/FL  10/24/2017   IR  NEPHROSTOGRAM LEFT THRU EXISTING ACCESS  12/05/2018   IR NEPHROSTOMY EXCHANGE LEFT  05/11/2024   IR NEPHROSTOMY EXCHANGE LEFT  07/09/2024   IR NEPHROSTOMY PLACEMENT LEFT  10/07/2017   IR NEPHROSTOMY PLACEMENT RIGHT  05/04/2024   IR NEPHROSTOMY TUBE CHANGE  04/11/2018   IR URETERAL STENT PLACEMENT EXISTING ACCESS LEFT  05/13/2024   IR URETERAL STENT RIGHT NEW ACCESS W/SEP NEPHROSTOMY CATH  05/13/2024   LAPAROTOMY N/A 04/20/2024   Procedure: EXPLORATORY LAPAROTOMY;  Surgeon: [name], MD;  [facility];  Service: General;  Laterality: N/A;   PARTIAL COLECTOMY N/A 04/21/2024   Procedure: COLECTOMY, PARTIAL;  Surgeon: [name], MD;  [facility];  Service: General;  Laterality: N/A;  Removal Wound Vac, Washout Ostomy, Possible Anastomosis, Possible Ileostomy.  Phasix Mesh.   POLYPECTOMY  06/29/2021   Procedure: POLYPECTOMY;  Surgeon: Debby Hila, MD;  [facility];  Service: Endoscopy;;   ROBOT ASSISTED LAPAROSCOPIC RADICAL PROSTATECTOMY  10/02/2012   Procedure: ROBOTIC ASSISTED LAPAROSCOPIC RADICAL PROSTATECTOMY  LEVEL 2;  Surgeon: Noretta Ferrara, MD;  [facility];  Service: Urology;  Laterality: N/A;   ROBOTIC ASSISTED LAPAROSCOPIC BLADDER DIVERTICULECTOMY N/A 08/08/2017   Procedure: XI ROBOTIC ASSISTED LAPAROSCOPIC RADICAL CYSTECTOMY COVERTED TO OPEN PELVIC LYMPHADNECTOMY BILATERAL AND ILEAL CONDUIT URINARY DIVERSION;  Surgeon: Ferrara Glance, MD;  [facility];  Service: Urology;  Laterality: N/A;   TRANSURETHRAL RESECTION OF BLADDER TUMOR  06/10/2017   Procedure: TRANSURETHRAL RESECTION OF BLADDER TUMOR (TURBT);  Surgeon: Ferrara Glance, MD;  [facility];  Service: Urology;;   VACUUM ASSISTED CLOSURE CHANGE N/A 04/20/2024   Procedure: PLACEMENT OF CONCETTA CAPES;  Surgeon: [name], MD;  [facility];  Service: General;  Laterality: N/A;   VENTRAL HERNIA REPAIR N/A 04/10/2024   Procedure: REPAIR, HERNIA, VENTRAL;  Surgeon: [name], MD;  [facility];   Service: General;  Laterality: N/A;   XI ROBOT ABDOMINAL PERINEAL RESECTION N/A 08/08/2017   Procedure: REPAIR OF RECTAL TEAR POSSIBLE PARTIAL PROCTECTOMY, CREATION OF  OSTOMY;  Surgeon: Debby Hila, MD;  [facility];  Service: General;  Laterality: N/A;   XI ROBOTIC ASSISTED PARASTOMAL HERNIA REPAIR N/A 04/10/2024   Procedure: REPAIR, HERNIA, PARASTOMAL AND VENTRAL HERNIAS, ROBOT-ASSISTED, LEFT INGUINAL HERNIA, LYSIS OF ADHESIONS INCARCERATED AND INSIONAL HERNIAS AND UROSTOMY REVISION;  Surgeon: [name], MD;  [facility];  Service: General;  Laterality: N/A;    Family History  Problem Relation Age of Onset   Lung cancer Mother    Hypertension Father    Colon cancer Other    CAD Neg Hx    Diabetes Neg Hx    Stroke Neg Hx     Social History:  reports that he quit smoking about 44 years ago. His smoking use included cigarettes. He started smoking about 53 years ago. He has never used smokeless tobacco. He reports current alcohol  use. He reports that he does not use drugs.   Allergies  Allergen Reactions   Demerol  [Meperidine ] Nausea And Vomiting    Medications Prior to Admission  Medication Sig Dispense Refill   acetaminophen  (TYLENOL ) 325 MG tablet Take 1-2 tablets (325-650 mg total) by mouth every 4 (four) hours as needed for mild pain (pain score 1-3).     alum & mag hydroxide-simeth (MAALOX PLUS) 400-400-40 MG/5ML suspension Take 15 mLs by mouth every 4 (four) hours as needed for indigestion. 355 mL 0   cyclobenzaprine  (FLEXERIL ) 5 MG tablet Take 1 tablet (5 mg total) by mouth 3 (three) times daily as needed for muscle spasms. 20 tablet 1   cyproheptadine  (PERIACTIN ) 4 MG tablet Take 1 tablet (4 mg total) by mouth at bedtime. 30 tablet 0   ELIQUIS  5 MG TABS tablet Take 5 mg by mouth 2 (two) times daily.     feeding supplement (ENSURE PLUS HIGH PROTEIN) LIQD Take 237 mLs by mouth 2 (two) times daily between meals. (Patient taking differently: Take 237 mLs by mouth  daily.)     hydrOXYzine  (ATARAX ) 25 MG tablet Take 1 tablet (25 mg total) by mouth 3 (three) times daily as needed for anxiety.     leptospermum manuka honey (MEDIHONEY) PSTE paste Apply 1 Application topically daily. 44 mL 0   loperamide  (IMODIUM ) 2 MG capsule Take 2 capsules (4 mg total) by mouth 2 (two) times daily. 30 capsule 0   melatonin 3 MG TABS tablet Take 1 tablet (3 mg total) by mouth at bedtime.     Metamucil Fiber 2 g CHEW Chew 1 Dose by mouth  2 (two) times daily.     metoCLOPramide  (REGLAN ) 5 MG tablet Take 1 tablet (5 mg total) by mouth 3 (three) times daily before meals. 90 tablet 0   midodrine  (PROAMATINE ) 5 MG tablet Take 3 tablets (15 mg total) by mouth 3 (three) times daily with meals. (Patient taking differently: Take 15 mg by mouth 2 (two) times daily with a meal.) 270 tablet 0   nutrition supplement, JUVEN, (JUVEN) PACK Take 1 packet by mouth 2 (two) times daily between meals. (Patient taking differently: Take 1 packet by mouth daily.)     oxyCODONE -acetaminophen  (PERCOCET) 5-325 MG tablet Take 2 tablets by mouth every 6 (six) hours as needed for severe pain (pain score 7-10). 21 tablet 0   pantoprazole  (PROTONIX ) 40 MG tablet Take 40 mg by mouth daily.     prochlorperazine  (COMPAZINE ) 5 MG tablet Take 1 tablet (5 mg total) by mouth every 6 (six) hours as needed for nausea. 30 tablet 0   sodium chloride  (OCEAN) 0.65 % SOLN nasal spray Place 1-2 sprays into both nostrils every 6 (six) hours as needed for congestion (dry nose).     vitamin B-12 (CYANOCOBALAMIN ) 500 MCG tablet Take 500 mcg by mouth daily.     ferrous sulfate  325 (65 FE) MG tablet Take 1 tablet (325 mg total) by mouth 2 (two) times daily with a meal. (Patient not taking: Reported on 07/02/2024) 60 tablet 0   fludrocortisone  (FLORINEF ) 0.1 MG tablet Take 2 tablets (0.2 mg total) by mouth daily. (Patient not taking: Reported on 07/02/2024) 60 tablet 0   ipratropium (ATROVENT ) 0.03 % nasal spray Place 1 spray into both  nostrils every 8 (eight) hours as needed (allergies). (Patient not taking: Reported on 07/02/2024)     metFORMIN (GLUCOPHAGE) 500 MG tablet Take 500 mg by mouth daily. (Patient not taking: Reported on 07/02/2024)     Multiple Vitamin (MULTIVITAMIN PO) Take 1 tablet by mouth daily. (Patient not taking: Reported on 07/02/2024)     Nystatin (GERHARDT'S BUTT CREAM) CREA Apply 1 Application topically 2 (two) times daily. (Patient not taking: Reported on 07/02/2024) 60 each 0   Omega-3 Fatty Acids (FISH OIL) 1000 MG CAPS Take 1,000 mg by mouth every Monday, Wednesday, and Friday. (Patient not taking: Reported on 07/02/2024)     polycarbophil (FIBERCON) 625 MG tablet Take 1 tablet (625 mg total) by mouth 2 (two) times daily. (Patient not taking: Reported on 07/02/2024) 60 tablet 2   promethazine  (PHENERGAN ) 25 MG tablet Take 25 mg by mouth every 8 (eight) hours as needed. (Patient not taking: Reported on 07/02/2024)     psyllium (HYDROCIL/METAMUCIL) 95 % PACK Take 1 packet by mouth daily. (Patient not taking: Reported on 07/02/2024)     rosuvastatin  (CRESTOR ) 10 MG tablet Take 1 tablet (10 mg total) by mouth every evening. (Patient not taking: Reported on 07/02/2024) 30 tablet 0   sertraline  (ZOLOFT ) 100 MG tablet Take 1 tablet (100 mg total) by mouth daily. (Patient not taking: Reported on 07/02/2024) 30 tablet 0      Home: Home Living Family/patient expects to be discharged to:: Private residence Living Arrangements: Spouse/significant other Available Help at Discharge: Family, Available 24 hours/day Type of Home: House Home Access: Stairs to enter [employer] of Steps: 2 Entrance Stairs-Rails: Can reach both Home Layout: Two level, Able to live on main level with bedroom/bathroom Bathroom Shower/Tub: Engineer, manufacturing systems: Standard Bathroom Accessibility: Yes Home Equipment: Rollator (4 wheels), Rolling Walker (2 wheels)  Lives With: Spouse  Functional History: Prior  Function Prior Level of Function : Independent/Modified Independent Mobility Comments: mod i RW, got weak after a couple of days at home then needed help ADLs Comments: wife reports pt was bathing himself, had assist wtih dressing.  Functional Status:  Mobility: Bed Mobility Overal bed mobility: Needs Assistance Bed Mobility: Rolling, Sidelying to Sit Rolling: Max assist, Used rails Sidelying to sit: Mod assist, +2 for physical assistance Supine to sit: Mod assist, HOB elevated, Used rails Sit to supine: Mod assist, Used rails General bed mobility comments: max assist to roll right this session after ted hose donned with mod assist to clear legs off bed and elevate trunk to sitting. mod assist to scoot to EOB Transfers Overall transfer level: Needs assistance Equipment used: Rolling walker (2 wheels) Transfers: Sit to/from Stand Sit to Stand: Mod assist, +2 physical assistance Bed to/from chair/wheelchair/BSC transfer type:: Stand pivot Stand pivot transfers: Mod assist, +2 safety/equipment Step pivot transfers: Mod assist, +2 physical assistance Transfer via Lift Equipment: Stedy General transfer comment: mod +2 assist to rise from bed with axillary assist and cues for anterior weight shift and hip extension. Pt able to step with shuffling feet bed to chair with use of RW and mod assist +2 for safety. repeated stand from chair with mod assist to scoot to edge and min +2 to stand from surface, unable to march in standing and stood grossly 15 sec Ambulation/Gait Ambulation/Gait assistance: Mod assist Gait Distance (Feet): 2 Feet Assistive device: Rolling walker (2 wheels) Gait Pattern/deviations: Step-to pattern General Gait Details: not yet able    ADL: ADL Overall ADL's : Needs assistance/impaired Eating/Feeding: Independent, Sitting Grooming: Sitting, Contact guard assist, Wash/dry face Upper Body Bathing: Sitting, Contact guard assist Lower Body Bathing: Moderate assistance,  Sitting/lateral leans Upper Body Dressing : Sitting, Minimal assistance Lower Body Dressing: Total assistance, Bed level Lower Body Dressing Details (indicate cue type and reason): don socks, TED hoses, wrapped BLEs. Toilet Transfer: Rolling walker (2 wheels), Stand-pivot, Maximal assistance Toilet Transfer Details (indicate cue type and reason): simulated with recliner t.f Toileting- Clothing Manipulation and Hygiene: Maximal assistance, +2 for physical assistance, +2 for safety/equipment, Sit to/from stand Functional mobility during ADLs: Maximal assistance (rec +2 for safety) General ADL Comments: not able to progress to standing ADLs using stedy. HR too high once in perched position.  Cognition: Cognition Orientation Level: Oriented X4 Cognition Arousal: Alert Behavior During Therapy: Flat affect  Physical Exam: Blood pressure 128/78, pulse 94, temperature 98.9 F (37.2 C), temperature source Oral, resp. rate 18, height 5' 11 (1.803 m), weight 88.4 kg, SpO2 97%. Physical Exam  Results for orders placed or performed during the hospital encounter of 07/01/24 (from the past 48 hours)  Vancomycin , peak     Status: Abnormal   Collection Time: 07/08/24  5:49 PM  Result Value Ref Range   Vancomycin  Pk >60 (HH) 30 - 40 ug/mL    Comment: CRITICAL RESULT CALLED TO, READ BACK BY AND VERIFIED WITH A ALAGO,RN 1956 07/08/2024 WBOND Performed at [facility], [address], [place], [place] [address]   Vancomycin , random     Status: Abnormal   Collection Time: 07/08/24  8:56 PM  Result Value Ref Range   Vancomycin  Rm 52 (HH) ug/mL    Comment: CRITICAL RESULT CALLED TO, READ BACK BY AND VERIFIED WITH L.LEGO 2206 07/08/2024 BY G.GANADEN        Random Vancomycin  therapeutic range is dependent on dosage and time of specimen collection. A peak  range is 20-40 ug/mL A trough range is 5-15 ug/mL        Performed at [facility], [address], [place], [place] [address]    Comprehensive metabolic panel     Status: Abnormal   Collection Time: 07/09/24  2:52 AM  Result Value Ref Range   Sodium 138 135 - 145 mmol/L   Potassium 3.5 3.5 - 5.1 mmol/L   Chloride 112 (H) 98 - 111 mmol/L   CO2 18 (L) 22 - 32 mmol/L   Glucose, Bld 77 70 - 99 mg/dL    Comment: Glucose reference range applies only to samples taken after fasting for at least 8 hours.   BUN 34 (H) 8 - 23 mg/dL   Creatinine, Ser 8.51 (H) 0.61 - 1.24 mg/dL   Calcium  9.6 8.9 - 10.3 mg/dL   Total Protein 5.1 (L) 6.5 - 8.1 g/dL   Albumin  1.9 (L) 3.5 - 5.0 g/dL   AST 21 15 - 41 U/L   ALT 25 0 - 44 U/L   Alkaline Phosphatase 73 38 - 126 U/L   Total Bilirubin 0.5 0.0 - 1.2 mg/dL   GFR, Estimated 50 (L) >60 mL/min    Comment: (NOTE) Calculated using the CKD-EPI Creatinine Equation (2021)    Anion gap 8 5 - 15    Comment: Performed at [facility], [address], [place], [place] [address]  Phosphorus     Status: None   Collection Time: 07/09/24  2:52 AM  Result Value Ref Range   Phosphorus 2.5 2.5 - 4.6 mg/dL    Comment: Performed at [facility], [address], [place], [place] [address]  Vancomycin , trough     Status: Abnormal   Collection Time: 07/09/24  3:57 PM  Result Value Ref Range   Vancomycin  Tr 39 (HH) 15 - 20 ug/mL    Comment: CRITICAL RESULT CALLED TO, READ BACK BY AND VERIFIED WITH JUDITHANN BULLOCKS RN @1722  ON 07/09/24 MAB Performed at [facility], [address], [place], [place] [address]    IR NEPHROSTOMY EXCHANGE BILATERAL Result Date: 07/09/2024 INDICATION: The patient has a complicated urologic history with current bilateral percutaneous nephrostomy tubes and retrograde right-sided ureteral stent. Plan is to exchange 3 catheters and at the end of the procedure capping the right percutaneous nephrostomy tube. EXAM: Bilateral percutaneous nephrostomy tube exchange and right-sided retrograde ureteral stent exchange COMPARISON:  None Available. ANESTHESIA/SEDATION:  Moderate (conscious) sedation was employed during this procedure. A total of Versed  see epic record mg and Fentanyl  see epic record mcg was administered intravenously by the radiology nurse. Total intra-service moderate Sedation Time: See epic record minutes. The patient's level of consciousness and vital signs were monitored continuously by radiology nursing throughout the procedure under my direct supervision. CONTRAST:  See epic record-administered into the collecting system(s) FLUOROSCOPY: Radiation Exposure Index (as provided by the fluoroscopic device): 25.5 mGy Kerma COMPLICATIONS: None immediate. PROCEDURE: Informed written consent was obtained from the patient after a thorough discussion of the procedural risks, benefits and alternatives. All questions were addressed. Maximal Sterile Barrier Technique was utilized including caps, mask, sterile gowns, sterile gloves, sterile drape, hand hygiene and skin antiseptic. A timeout was performed prior to the initiation of the procedure. A nearly prone position, the patient was prepped and draped in usual sterile fashion. Initially dilute contrast was injected in the retrograde right-sided ureteral stent in order to evaluate catheter position. Contrast can be seen in the dilated ureter and  the catheter is in good position. The catheter was cut and a stiff Glidewire was advanced under fluoroscopic guidance into the renal pelvis. Catheter was then removed over the guidewire and a new 10 French cook pigtail catheter was advanced into the renal collecting system. Locking mechanism was engaged. Dilute contrast was then injected into the right percutaneous nephrostomy tube. Catheter is in good position in the renal pelvis on the right side. Catheter and retention suture were cut. A stiff Glidewire was advanced under fluoroscopic guidance. Catheter was. A new 10 French pigtail catheter was then advanced over the guidewire and coiled within renal pelvis. Locking mechanism  was engaged. Retention suture was engaged and sterile dressing was applied. The right-sided percutaneous nephrostomy tube was then capped. Dilute contrast was then injected into the left percutaneous nephrostomy tube demonstrating the catheter to be coiled within the renal pelvis. The retention suture and catheter were then cut. A stiff Glidewire was then advanced under fluoroscopic guidance into the catheter and coiled within the renal pelvis. The catheter was completely removed from the patient. A new 10 French percutaneous nephrostomy tube was then advanced over the guidewire and coiled within the renal pelvis. Guidewire was removed. Retention suture and sterile dressing were applied. This catheter was connected to gravity drainage. IMPRESSION: Satisfactory exchange of bilateral percutaneous nephrostomy tubes as well as the retrograde right-sided ureteral stent. The patient will return for routine exchanges in approximately 2 months. The right percutaneous nephrostomy is capped and if trial is successful this catheter will likely be removed. I would recommend this catheter be removed under fluoroscopic guidance in order to ensure no malpositioning of the right-sided ureteral stent during the removal process. Electronically Signed   By: Cordella Banner   On: 07/09/2024 13:58   IR EXT NEPHROURETERAL CATH EXCHANGE Result Date: 07/09/2024 INDICATION: The patient has a complicated urologic history with current bilateral percutaneous nephrostomy tubes and retrograde right-sided ureteral stent. Plan is to exchange 3 catheters and at the end of the procedure capping the right percutaneous nephrostomy tube. EXAM: Bilateral percutaneous nephrostomy tube exchange and right-sided retrograde ureteral stent exchange COMPARISON:  None Available. ANESTHESIA/SEDATION: Moderate (conscious) sedation was employed during this procedure. A total of Versed  see epic record mg and Fentanyl  see epic record mcg was administered  intravenously by the radiology nurse. Total intra-service moderate Sedation Time: See epic record minutes. The patient's level of consciousness and vital signs were monitored continuously by radiology nursing throughout the procedure under my direct supervision. CONTRAST:  See epic record-administered into the collecting system(s) FLUOROSCOPY: Radiation Exposure Index (as provided by the fluoroscopic device): 25.5 mGy Kerma COMPLICATIONS: None immediate. PROCEDURE: Informed written consent was obtained from the patient after a thorough discussion of the procedural risks, benefits and alternatives. All questions were addressed. Maximal Sterile Barrier Technique was utilized including caps, mask, sterile gowns, sterile gloves, sterile drape, hand hygiene and skin antiseptic. A timeout was performed prior to the initiation of the procedure. A nearly prone position, the patient was prepped and draped in usual sterile fashion. Initially dilute contrast was injected in the retrograde right-sided ureteral stent in order to evaluate catheter position. Contrast can be seen in the dilated ureter and the catheter is in good position. The catheter was cut and a stiff Glidewire was advanced under fluoroscopic guidance into the renal pelvis. Catheter was then removed over the guidewire and a new 10 French cook pigtail catheter was advanced into the renal collecting system. Locking mechanism was engaged. Dilute contrast was then injected  into the right percutaneous nephrostomy tube. Catheter is in good position in the renal pelvis on the right side. Catheter and retention suture were cut. A stiff Glidewire was advanced under fluoroscopic guidance. Catheter was. A new 10 French pigtail catheter was then advanced over the guidewire and coiled within renal pelvis. Locking mechanism was engaged. Retention suture was engaged and sterile dressing was applied. The right-sided percutaneous nephrostomy tube was then capped. Dilute contrast  was then injected into the left percutaneous nephrostomy tube demonstrating the catheter to be coiled within the renal pelvis. The retention suture and catheter were then cut. A stiff Glidewire was then advanced under fluoroscopic guidance into the catheter and coiled within the renal pelvis. The catheter was completely removed from the patient. A new 10 French percutaneous nephrostomy tube was then advanced over the guidewire and coiled within the renal pelvis. Guidewire was removed. Retention suture and sterile dressing were applied. This catheter was connected to gravity drainage. IMPRESSION: Satisfactory exchange of bilateral percutaneous nephrostomy tubes as well as the retrograde right-sided ureteral stent. The patient will return for routine exchanges in approximately 2 months. The right percutaneous nephrostomy is capped and if trial is successful this catheter will likely be removed. I would recommend this catheter be removed under fluoroscopic guidance in order to ensure no malpositioning of the right-sided ureteral stent during the removal process. Electronically Signed   By: Cordella Banner   On: 07/09/2024 13:58      Blood pressure 128/78, pulse 94, temperature 98.9 F (37.2 C), temperature source Oral, resp. rate 18, height 5' 11 (1.803 m), weight 88.4 kg, SpO2 97%.  Medical Problem List and Plan: 1. Functional deficits secondary to ***  -patient may *** shower  -ELOS/Goals: *** 2.  H/o PE/Antithrombotics: -DVT/anticoagulation:  Pharmaceutical: Eliquis   -antiplatelet therapy: N/A 3. Pain Management: Oxycodone  prn.  4. Mood/Behavior/Sleep: LCSW to follow for evaluation and support.   --melatonin for insomnia.   -antipsychotic agents: N/A 5. Neuropsych/cognition: This patient is not capable of making decisions on his own behalf. 6. Skin/Wound Care: Routine pressure relief measures. Encourage adequate diet  --dressing changes daily. Gerhardt's cream to buttock wound.  7.  Fluids/Electrolytes/Nutrition: Monitor I/O. Check CMET in am. Periactin  for appetite  --continue juven bid. Has been refusing Ensure supplement.  8. Sepsis/Abdominal wall/mesh infection: IV Vanc/Ceftriaxone  for 6 weeks from 08/21  --Probable 6 moths total of antibiotics--ID follow up 09/18 @10  am --Corynebacterium species-->6 more weeks Dalbavancin? V/s tedezolid/linezolid . Ecoli-->ceftriaxone  to Omadocycline pending susceptibility.  9. Left> right hydronephrosis: Nephrostomy tubes with stent exchanged on 08/27 --continue clamping right tube and left to drain per urology.  10. Acute on chronic renal failure: Improved but slowly trending up with SCr 1.48 --continue to monitor 11. OSA: Encourage CPAP use 12. 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 Question need for reglan  14. Depression: On Zoloft  15. Malnutrition: Albumin  @1 .9/TP-5.1. Recheck Phos/Mg levels.  16. Anemia of chronic illness: Stable overall.      ***  Sharlet GORMAN Schmitz, PA-C 07/10/2024

## 2024-07-10 NOTE — Progress Notes (Signed)
   07/10/24 2128  BiPAP/CPAP/SIPAP  BiPAP/CPAP/SIPAP Pt Type Adult  Reason BIPAP/CPAP not in use Non-compliant  BiPAP/CPAP /SiPAP Vitals  Pulse Rate 64  Resp 16  SpO2 97 %  Bilateral Breath Sounds Clear;Diminished

## 2024-07-10 NOTE — Progress Notes (Signed)
 Inpatient Rehab Admissions Coordinator:    I have a CIR bed for this Pt. On SATURDAY 07/11/24. RN my call report to [phone] after 12pm tomorrow.   [name] Kleine, MS, CCC-SLP Rehab Admissions Coordinator  [phone] (celll) [phone] (office)

## 2024-07-11 ENCOUNTER — Inpatient Hospital Stay (HOSPITAL_COMMUNITY)
Admission: AD | Admit: 2024-07-11 | Discharge: 2024-08-14 | DRG: 945 | Disposition: A | Discharge status: Home / Self Care | Source: Intra-hospital | Attending: Physical Medicine and Rehabilitation | Admitting: Physical Medicine and Rehabilitation

## 2024-07-11 DIAGNOSIS — N179 Acute kidney failure, unspecified: Secondary | ICD-10-CM | POA: Diagnosis present

## 2024-07-11 DIAGNOSIS — D62 Acute posthemorrhagic anemia: Secondary | ICD-10-CM | POA: Diagnosis present

## 2024-07-11 DIAGNOSIS — Z8249 Family history of ischemic heart disease and other diseases of the circulatory system: Secondary | ICD-10-CM

## 2024-07-11 DIAGNOSIS — T8579XD Infection and inflammatory reaction due to other internal prosthetic devices, implants and grafts, subsequent encounter: Secondary | ICD-10-CM

## 2024-07-11 DIAGNOSIS — Z9049 Acquired absence of other specified parts of digestive tract: Secondary | ICD-10-CM

## 2024-07-11 DIAGNOSIS — Z7901 Long term (current) use of anticoagulants: Secondary | ICD-10-CM

## 2024-07-11 DIAGNOSIS — N1832 Chronic kidney disease, stage 3b: Secondary | ICD-10-CM | POA: Diagnosis present

## 2024-07-11 DIAGNOSIS — K4090 Unilateral inguinal hernia, without obstruction or gangrene, not specified as recurrent: Secondary | ICD-10-CM | POA: Diagnosis present

## 2024-07-11 DIAGNOSIS — Z8551 Personal history of malignant neoplasm of bladder: Secondary | ICD-10-CM

## 2024-07-11 DIAGNOSIS — Z906 Acquired absence of other parts of urinary tract: Secondary | ICD-10-CM

## 2024-07-11 DIAGNOSIS — Z79899 Other long term (current) drug therapy: Secondary | ICD-10-CM

## 2024-07-11 DIAGNOSIS — I5032 Chronic diastolic (congestive) heart failure: Secondary | ICD-10-CM | POA: Diagnosis present

## 2024-07-11 DIAGNOSIS — R310 Gross hematuria: Secondary | ICD-10-CM | POA: Diagnosis present

## 2024-07-11 DIAGNOSIS — B9622 Other specified Shiga toxin-producing Escherichia coli [E. coli] (STEC) as the cause of diseases classified elsewhere: Secondary | ICD-10-CM | POA: Diagnosis not present

## 2024-07-11 DIAGNOSIS — I951 Orthostatic hypotension: Secondary | ICD-10-CM | POA: Diagnosis not present

## 2024-07-11 DIAGNOSIS — E872 Acidosis, unspecified: Secondary | ICD-10-CM | POA: Diagnosis present

## 2024-07-11 DIAGNOSIS — G4700 Insomnia, unspecified: Secondary | ICD-10-CM | POA: Diagnosis present

## 2024-07-11 DIAGNOSIS — E1122 Type 2 diabetes mellitus with diabetic chronic kidney disease: Secondary | ICD-10-CM | POA: Diagnosis present

## 2024-07-11 DIAGNOSIS — R5381 Other malaise: Principal | ICD-10-CM | POA: Diagnosis present

## 2024-07-11 DIAGNOSIS — T83032D Leakage of nephrostomy catheter, subsequent encounter: Secondary | ICD-10-CM

## 2024-07-11 DIAGNOSIS — R9431 Abnormal electrocardiogram [ECG] [EKG]: Secondary | ICD-10-CM | POA: Diagnosis present

## 2024-07-11 DIAGNOSIS — R059 Cough, unspecified: Secondary | ICD-10-CM | POA: Diagnosis present

## 2024-07-11 DIAGNOSIS — E869 Volume depletion, unspecified: Secondary | ICD-10-CM | POA: Diagnosis not present

## 2024-07-11 DIAGNOSIS — Z6832 Body mass index (BMI) 32.0-32.9, adult: Secondary | ICD-10-CM | POA: Diagnosis not present

## 2024-07-11 DIAGNOSIS — Y832 Surgical operation with anastomosis, bypass or graft as the cause of abnormal reaction of the patient, or of later complication, without mention of misadventure at the time of the procedure: Secondary | ICD-10-CM | POA: Diagnosis present

## 2024-07-11 DIAGNOSIS — I4510 Unspecified right bundle-branch block: Secondary | ICD-10-CM | POA: Diagnosis present

## 2024-07-11 DIAGNOSIS — L02211 Cutaneous abscess of abdominal wall: Secondary | ICD-10-CM | POA: Diagnosis not present

## 2024-07-11 DIAGNOSIS — Z86711 Personal history of pulmonary embolism: Secondary | ICD-10-CM

## 2024-07-11 DIAGNOSIS — Z932 Ileostomy status: Secondary | ICD-10-CM | POA: Diagnosis not present

## 2024-07-11 DIAGNOSIS — Z8546 Personal history of malignant neoplasm of prostate: Secondary | ICD-10-CM

## 2024-07-11 DIAGNOSIS — E871 Hypo-osmolality and hyponatremia: Secondary | ICD-10-CM | POA: Diagnosis present

## 2024-07-11 DIAGNOSIS — G9341 Metabolic encephalopathy: Secondary | ICD-10-CM | POA: Diagnosis present

## 2024-07-11 DIAGNOSIS — Z1629 Resistance to other single specified antibiotic: Secondary | ICD-10-CM | POA: Diagnosis not present

## 2024-07-11 DIAGNOSIS — C679 Malignant neoplasm of bladder, unspecified: Secondary | ICD-10-CM | POA: Diagnosis not present

## 2024-07-11 DIAGNOSIS — D631 Anemia in chronic kidney disease: Secondary | ICD-10-CM | POA: Diagnosis present

## 2024-07-11 DIAGNOSIS — J9811 Atelectasis: Secondary | ICD-10-CM | POA: Diagnosis present

## 2024-07-11 DIAGNOSIS — R790 Abnormal level of blood mineral: Secondary | ICD-10-CM | POA: Diagnosis not present

## 2024-07-11 DIAGNOSIS — Z85828 Personal history of other malignant neoplasm of skin: Secondary | ICD-10-CM

## 2024-07-11 DIAGNOSIS — D696 Thrombocytopenia, unspecified: Secondary | ICD-10-CM | POA: Diagnosis not present

## 2024-07-11 DIAGNOSIS — R1312 Dysphagia, oropharyngeal phase: Secondary | ICD-10-CM | POA: Diagnosis present

## 2024-07-11 DIAGNOSIS — D638 Anemia in other chronic diseases classified elsewhere: Secondary | ICD-10-CM | POA: Diagnosis not present

## 2024-07-11 DIAGNOSIS — R4189 Other symptoms and signs involving cognitive functions and awareness: Secondary | ICD-10-CM | POA: Diagnosis not present

## 2024-07-11 DIAGNOSIS — F4321 Adjustment disorder with depressed mood: Secondary | ICD-10-CM | POA: Diagnosis present

## 2024-07-11 DIAGNOSIS — Z1624 Resistance to multiple antibiotics: Secondary | ICD-10-CM | POA: Diagnosis present

## 2024-07-11 DIAGNOSIS — E876 Hypokalemia: Secondary | ICD-10-CM | POA: Diagnosis present

## 2024-07-11 DIAGNOSIS — N139 Obstructive and reflux uropathy, unspecified: Secondary | ICD-10-CM | POA: Diagnosis present

## 2024-07-11 DIAGNOSIS — Y732 Prosthetic and other implants, materials and accessory gastroenterology and urology devices associated with adverse incidents: Secondary | ICD-10-CM | POA: Diagnosis not present

## 2024-07-11 DIAGNOSIS — E860 Dehydration: Secondary | ICD-10-CM | POA: Diagnosis present

## 2024-07-11 DIAGNOSIS — Z87891 Personal history of nicotine dependence: Secondary | ICD-10-CM

## 2024-07-11 DIAGNOSIS — G252 Other specified forms of tremor: Secondary | ICD-10-CM | POA: Diagnosis not present

## 2024-07-11 DIAGNOSIS — K5730 Diverticulosis of large intestine without perforation or abscess without bleeding: Secondary | ICD-10-CM | POA: Diagnosis present

## 2024-07-11 DIAGNOSIS — I2510 Atherosclerotic heart disease of native coronary artery without angina pectoris: Secondary | ICD-10-CM | POA: Diagnosis present

## 2024-07-11 DIAGNOSIS — N1330 Unspecified hydronephrosis: Secondary | ICD-10-CM | POA: Diagnosis present

## 2024-07-11 DIAGNOSIS — R4689 Other symptoms and signs involving appearance and behavior: Secondary | ICD-10-CM | POA: Diagnosis not present

## 2024-07-11 DIAGNOSIS — B9620 Unspecified Escherichia coli [E. coli] as the cause of diseases classified elsewhere: Secondary | ICD-10-CM | POA: Diagnosis not present

## 2024-07-11 DIAGNOSIS — Z9079 Acquired absence of other genital organ(s): Secondary | ICD-10-CM

## 2024-07-11 DIAGNOSIS — K219 Gastro-esophageal reflux disease without esophagitis: Secondary | ICD-10-CM | POA: Diagnosis present

## 2024-07-11 DIAGNOSIS — F419 Anxiety disorder, unspecified: Secondary | ICD-10-CM | POA: Diagnosis present

## 2024-07-11 DIAGNOSIS — Z936 Other artificial openings of urinary tract status: Secondary | ICD-10-CM

## 2024-07-11 DIAGNOSIS — K439 Ventral hernia without obstruction or gangrene: Secondary | ICD-10-CM | POA: Diagnosis present

## 2024-07-11 DIAGNOSIS — A419 Sepsis, unspecified organism: Secondary | ICD-10-CM | POA: Diagnosis not present

## 2024-07-11 DIAGNOSIS — E440 Moderate protein-calorie malnutrition: Secondary | ICD-10-CM | POA: Diagnosis present

## 2024-07-11 DIAGNOSIS — B9629 Other Escherichia coli [E. coli] as the cause of diseases classified elsewhere: Secondary | ICD-10-CM | POA: Diagnosis not present

## 2024-07-11 DIAGNOSIS — Z8619 Personal history of other infectious and parasitic diseases: Secondary | ICD-10-CM

## 2024-07-11 DIAGNOSIS — Z885 Allergy status to narcotic agent status: Secondary | ICD-10-CM

## 2024-07-11 DIAGNOSIS — G7281 Critical illness myopathy: Secondary | ICD-10-CM | POA: Diagnosis present

## 2024-07-11 DIAGNOSIS — Z87890 Personal history of sex reassignment: Secondary | ICD-10-CM | POA: Diagnosis not present

## 2024-07-11 DIAGNOSIS — I959 Hypotension, unspecified: Secondary | ICD-10-CM | POA: Diagnosis not present

## 2024-07-11 DIAGNOSIS — Z7409 Other reduced mobility: Secondary | ICD-10-CM | POA: Diagnosis present

## 2024-07-11 DIAGNOSIS — I130 Hypertensive heart and chronic kidney disease with heart failure and stage 1 through stage 4 chronic kidney disease, or unspecified chronic kidney disease: Secondary | ICD-10-CM | POA: Diagnosis present

## 2024-07-11 DIAGNOSIS — N189 Chronic kidney disease, unspecified: Secondary | ICD-10-CM | POA: Diagnosis not present

## 2024-07-11 DIAGNOSIS — T8149XD Infection following a procedure, other surgical site, subsequent encounter: Secondary | ICD-10-CM | POA: Diagnosis not present

## 2024-07-11 DIAGNOSIS — B9689 Other specified bacterial agents as the cause of diseases classified elsewhere: Secondary | ICD-10-CM | POA: Diagnosis not present

## 2024-07-11 DIAGNOSIS — R41841 Cognitive communication deficit: Secondary | ICD-10-CM | POA: Diagnosis present

## 2024-07-11 DIAGNOSIS — N178 Other acute kidney failure: Secondary | ICD-10-CM | POA: Diagnosis not present

## 2024-07-11 DIAGNOSIS — F32A Depression, unspecified: Secondary | ICD-10-CM | POA: Diagnosis present

## 2024-07-11 DIAGNOSIS — G4733 Obstructive sleep apnea (adult) (pediatric): Secondary | ICD-10-CM | POA: Diagnosis present

## 2024-07-11 LAB — VANCOMYCIN, RANDOM: Vancomycin Rm: 23 ug/mL

## 2024-07-11 MED ORDER — ALUM & MAG HYDROXIDE-SIMETH 200-200-20 MG/5ML PO SUSP
30.0000 mL | ORAL | Status: DC | PRN
  Administered 2024-07-13 – 2024-07-14 (×2): 30 mL via ORAL
  Filled 2024-07-11 (×2): qty 30

## 2024-07-11 MED ORDER — MELATONIN 5 MG PO TABS
5.0000 mg | ORAL_TABLET | Freq: Every evening | ORAL | Status: DC | PRN
  Administered 2024-07-22: 5 mg via ORAL
  Filled 2024-07-11 (×2): qty 1

## 2024-07-11 MED ORDER — PROCHLORPERAZINE 25 MG RE SUPP: 12.5000 mg | Freq: Four times a day (QID) | RECTAL | Status: DC | PRN

## 2024-07-11 MED ORDER — JUVEN PO PACK
1.0000 | PACK | Freq: Two times a day (BID) | ORAL | Status: DC
  Administered 2024-07-11 – 2024-07-29 (×31): 1 via ORAL
  Filled 2024-07-11 (×29): qty 1

## 2024-07-11 MED ORDER — METOCLOPRAMIDE HCL 5 MG PO TABS
5.0000 mg | ORAL_TABLET | Freq: Three times a day (TID) | ORAL | Status: DC
  Administered 2024-07-11 – 2024-07-13 (×5): 5 mg via ORAL
  Filled 2024-07-11 (×5): qty 1

## 2024-07-11 MED ORDER — CYPROHEPTADINE HCL 4 MG PO TABS
4.0000 mg | ORAL_TABLET | Freq: Every day | ORAL | Status: DC
  Administered 2024-07-11 – 2024-07-15 (×4): 4 mg via ORAL
  Filled 2024-07-11 (×5): qty 1

## 2024-07-11 MED ORDER — PSYLLIUM 95 % PO PACK
1.0000 | PACK | Freq: Every day | ORAL | Status: DC
  Administered 2024-07-13 – 2024-07-18 (×4): 1 via ORAL
  Filled 2024-07-11 (×7): qty 1

## 2024-07-11 MED ORDER — LOPERAMIDE HCL 2 MG PO CAPS
4.0000 mg | ORAL_CAPSULE | Freq: Three times a day (TID) | ORAL | Status: DC
  Administered 2024-07-11 – 2024-08-14 (×100): 4 mg via ORAL
  Filled 2024-07-11 (×100): qty 2

## 2024-07-11 MED ORDER — CHLORHEXIDINE GLUCONATE CLOTH 2 % EX PADS
6.0000 | MEDICATED_PAD | Freq: Every day | CUTANEOUS | Status: DC
  Administered 2024-07-12 – 2024-07-26 (×13): 6 via TOPICAL

## 2024-07-11 MED ORDER — VANCOMYCIN HCL 1250 MG/250ML IV SOLN
1250.0000 mg | INTRAVENOUS | Status: DC
  Filled 2024-07-11: qty 250

## 2024-07-11 MED ORDER — HYDROXYZINE HCL 25 MG PO TABS: 25.0000 mg | ORAL_TABLET | Freq: Three times a day (TID) | ORAL | Status: DC | PRN

## 2024-07-11 MED ORDER — MELATONIN 3 MG PO TABS
3.0000 mg | ORAL_TABLET | Freq: Every day | ORAL | Status: DC
  Administered 2024-07-11 – 2024-08-13 (×33): 3 mg via ORAL
  Filled 2024-07-11 (×34): qty 1

## 2024-07-11 MED ORDER — ACETAMINOPHEN 325 MG PO TABS
325.0000 mg | ORAL_TABLET | ORAL | Status: DC | PRN
  Administered 2024-07-25 – 2024-07-27 (×2): 650 mg via ORAL
  Filled 2024-07-11 (×3): qty 2

## 2024-07-11 MED ORDER — VANCOMYCIN HCL 1250 MG/250ML IV SOLN
1250.0000 mg | INTRAVENOUS | Status: DC
  Administered 2024-07-11: 1250 mg via INTRAVENOUS
  Filled 2024-07-11: qty 250

## 2024-07-11 MED ORDER — GERHARDT'S BUTT CREAM
1.0000 | TOPICAL_CREAM | Freq: Two times a day (BID) | CUTANEOUS | Status: DC
  Administered 2024-07-12 – 2024-08-14 (×62): 1 via TOPICAL
  Filled 2024-07-11: qty 60

## 2024-07-11 MED ORDER — IPRATROPIUM BROMIDE 0.06 % NA SOLN: 1.0000 | Freq: Three times a day (TID) | NASAL | Status: DC | PRN

## 2024-07-11 MED ORDER — SERTRALINE HCL 100 MG PO TABS
100.0000 mg | ORAL_TABLET | Freq: Every day | ORAL | Status: DC
Start: 2024-07-12 — End: 2024-07-25
  Administered 2024-07-12 – 2024-07-25 (×13): 100 mg via ORAL
  Filled 2024-07-11 (×14): qty 1

## 2024-07-11 MED ORDER — ENSURE PLUS HIGH PROTEIN PO LIQD
237.0000 mL | Freq: Two times a day (BID) | ORAL | Status: DC
  Administered 2024-07-11 – 2024-07-29 (×14): 237 mL via ORAL

## 2024-07-11 MED ORDER — SODIUM CHLORIDE 0.9 % IV SOLN
2.0000 g | INTRAVENOUS | Status: DC
  Administered 2024-07-12 – 2024-08-04 (×25): 2 g via INTRAVENOUS
  Filled 2024-07-11 (×25): qty 20

## 2024-07-11 MED ORDER — PROCHLORPERAZINE MALEATE 5 MG PO TABS
5.0000 mg | ORAL_TABLET | Freq: Four times a day (QID) | ORAL | Status: DC | PRN
  Administered 2024-07-21: 10 mg via ORAL
  Administered 2024-07-30: 5 mg via ORAL
  Administered 2024-08-01: 10 mg via ORAL
  Filled 2024-07-11: qty 2
  Filled 2024-07-11: qty 1
  Filled 2024-07-11: qty 2

## 2024-07-11 MED ORDER — METOCLOPRAMIDE HCL 5 MG/ML IJ SOLN
10.0000 mg | Freq: Four times a day (QID) | INTRAMUSCULAR | Status: DC | PRN
  Administered 2024-07-20 (×2): 10 mg via INTRAVENOUS
  Filled 2024-07-11 (×5): qty 2

## 2024-07-11 MED ORDER — OMEGA-3-ACID ETHYL ESTERS 1 G PO CAPS
1.0000 g | ORAL_CAPSULE | ORAL | Status: DC
  Administered 2024-07-13 – 2024-08-10 (×6): 1 g via ORAL
  Filled 2024-07-11 (×17): qty 1

## 2024-07-11 MED ORDER — DIPHENHYDRAMINE HCL 25 MG PO CAPS: 25.0000 mg | ORAL_CAPSULE | Freq: Four times a day (QID) | ORAL | Status: DC | PRN

## 2024-07-11 MED ORDER — ORAL CARE MOUTH RINSE: 15.0000 mL | OROMUCOSAL | Status: DC | PRN

## 2024-07-11 MED ORDER — IOHEXOL 300 MG/ML  SOLN: 50.0000 mL | Freq: Once | INTRAMUSCULAR | Status: DC | PRN

## 2024-07-11 MED ORDER — OXYCODONE-ACETAMINOPHEN 5-325 MG PO TABS
2.0000 | ORAL_TABLET | Freq: Four times a day (QID) | ORAL | Status: DC | PRN
  Administered 2024-07-12 – 2024-07-27 (×3): 2 via ORAL
  Filled 2024-07-11 (×3): qty 2

## 2024-07-11 MED ORDER — MEDIHONEY WOUND/BURN DRESSING EX PSTE
1.0000 | PASTE | Freq: Every day | CUTANEOUS | Status: DC
  Administered 2024-07-12 – 2024-08-14 (×31): 1 via TOPICAL
  Filled 2024-07-11: qty 44

## 2024-07-11 MED ORDER — FLEET ENEMA RE ENEM: 1.0000 | ENEMA | Freq: Once | RECTAL | Status: DC | PRN

## 2024-07-11 MED ORDER — PROCHLORPERAZINE EDISYLATE 10 MG/2ML IJ SOLN
5.0000 mg | Freq: Four times a day (QID) | INTRAMUSCULAR | Status: DC | PRN
  Administered 2024-07-11 – 2024-08-02 (×3): 10 mg via INTRAVENOUS
  Filled 2024-07-11 (×3): qty 2

## 2024-07-11 MED ORDER — VANCOMYCIN IV (FOR PTA / DISCHARGE USE ONLY): 1250.0000 mg | INTRAVENOUS | 0 refills | Status: DC

## 2024-07-11 MED ORDER — APIXABAN 5 MG PO TABS
5.0000 mg | ORAL_TABLET | Freq: Two times a day (BID) | ORAL | Status: DC
  Administered 2024-07-11 – 2024-08-14 (×67): 5 mg via ORAL
  Filled 2024-07-11 (×68): qty 1

## 2024-07-11 MED ORDER — CYANOCOBALAMIN 500 MCG PO TABS: 500.0000 ug | ORAL_TABLET | ORAL | Status: DC

## 2024-07-11 MED ORDER — VITAMIN B-12 1000 MCG PO TABS
500.0000 ug | ORAL_TABLET | ORAL | Status: DC
  Administered 2024-07-13 – 2024-08-14 (×15): 500 ug via ORAL
  Filled 2024-07-11 (×15): qty 1

## 2024-07-11 MED ORDER — FLUDROCORTISONE ACETATE 0.1 MG PO TABS
0.2000 mg | ORAL_TABLET | Freq: Every day | ORAL | Status: DC
  Administered 2024-07-12 – 2024-08-06 (×25): 0.2 mg via ORAL
  Filled 2024-07-11 (×26): qty 2

## 2024-07-11 MED ORDER — ROSUVASTATIN CALCIUM 5 MG PO TABS
10.0000 mg | ORAL_TABLET | Freq: Every evening | ORAL | Status: DC
  Administered 2024-07-11 – 2024-08-13 (×33): 10 mg via ORAL
  Filled 2024-07-11 (×34): qty 2

## 2024-07-11 MED ORDER — CEFTRIAXONE IV (FOR PTA / DISCHARGE USE ONLY): 2.0000 g | INTRAVENOUS | 0 refills | Status: DC

## 2024-07-11 MED ORDER — GUAIFENESIN-DM 100-10 MG/5ML PO SYRP: 5.0000 mL | ORAL_SOLUTION | Freq: Four times a day (QID) | ORAL | Status: DC | PRN

## 2024-07-11 MED ORDER — ADULT MULTIVITAMIN W/MINERALS CH
1.0000 | ORAL_TABLET | Freq: Every day | ORAL | Status: DC
  Administered 2024-07-13 – 2024-07-19 (×6): 1 via ORAL
  Filled 2024-07-11 (×10): qty 1

## 2024-07-11 MED ORDER — CYCLOBENZAPRINE HCL 5 MG PO TABS: 5.0000 mg | ORAL_TABLET | Freq: Three times a day (TID) | ORAL | Status: DC | PRN

## 2024-07-11 MED ORDER — MIDODRINE HCL 5 MG PO TABS
15.0000 mg | ORAL_TABLET | Freq: Three times a day (TID) | ORAL | Status: DC
Start: 2024-07-11 — End: 2024-07-21
  Administered 2024-07-11 – 2024-07-21 (×25): 15 mg via ORAL
  Filled 2024-07-11 (×27): qty 3

## 2024-07-11 MED ORDER — SALINE SPRAY 0.65 % NA SOLN
1.0000 | Freq: Four times a day (QID) | NASAL | Status: DC | PRN
  Administered 2024-07-15 – 2024-07-26 (×2): 1 via NASAL
  Filled 2024-07-11: qty 44

## 2024-07-11 MED ORDER — BISACODYL 10 MG RE SUPP
10.0000 mg | Freq: Every day | RECTAL | Status: DC | PRN
  Filled 2024-07-11: qty 1

## 2024-07-11 NOTE — Discharge Summary (Signed)
 Physician Discharge Summary   Patient: Kristopher Thompson MRN: [id number] DOB: 1951/09/10  Admit date:     07/01/2024  Discharge date: 07/11/24  Discharge Physician: Sabas GORMAN Brod   PCP: [name], MD   Recommendations at discharge:   Patient to be transferred to inpatient rehab Continue vancomycin  till 08-13-24 Continue ceftriaxone  till 08/17/2024  Discharge Diagnoses: Principal Problem:   AKI (acute kidney injury) 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 Postoperative course was complicated by bowel perforation, sepsis, ICU admission with intubation, requiring pressors and CRRT. Patient had bilateral nephrostomy tubes placed as well and had been in inpatient rehab until August 8 of this year. At that time a study was done that supported capping the nephrostomy tubes. Patient was sent home and was doing well including ambulating on his own, was tolerating p.o., had good urine output until approximately 3 days ago. reports decreased urine output from about a liter a day to down to  300 in the last 24 hours. Found to have aki and possible pyelonephritis.     Assessment and Plan:    severe sepsis-infected abdominal wall -Sepsis physiology has resolved -Presented with sinus tachycardia, encephalopathy, pyonephritis -Started on broad-spectrum antibiotics -Etiology infected abdominal wall with purulent material in the drain, this was removed by general surgery on 8/21 culture tip noted abundant E. coli and Corynebacterium, E. coli was resistant to ampicillin, cefazolin , Cipro  and Bactrim . -ID consulted, plan to continue  ceftriaxone  and vancomycin  Plan 3-6 months treatment  6 weeks iv vanc, ceftriaxone   6 more weeks ?dalbavancin vs transitioning to tedezolid/linezolid ; ceftriaxone  -> ?omado pending susceptibility   Acute kidney injury - Creatinine 3.57 on presentation, markedly above baseline 1-1.3.  Multi factorial etiology most likely secondary to ongoing urinary obstruction.  Percutaneous nephrostomy tubes uncapped and IV fluids on board.  Appears to be resolved at this time after IV fluid hydration.   Creatinine improved to 1.48   Concern for urinary obstruction with L>R hydronephrosis - Nephrostomy tubes uncapped.  Evaluated by urology and following closely.  Urine output excellent bilaterally.  Per urology, right nephrostomy tube capped yesterday.  Very slight bump in urine creatinine, so watching kidney function closely.   -Underwent exchange of bilateral nephrostomy tubes    Bladder cancer s/p radical cystectomy and ileal conduit - Initial surgery 07/2017, subsequent lower complicated course after with urine leak, ureteral anastomosis, parastomal hernia incarceration, bowel perforation with abscess, creation of end ileostomy, and hydronephrosis requiring bilateral nephrostomy tubes.  Nephrostomy tubes initially capped as urine output was good.  However given AKI and decreased urine output, now  uncapped.  Evaluation by urology, general surgery.  No concern for urine  leak.  Previous abdominal wall drain for bowel perforation and abscess removed by general surgery 8/21.  Culture noting abundant e. Coli and Corynebacterium E. coli resistant to ampicillin, cefazolin , Cipro , Bactrim .  Remains on ceftriaxone , vancomycin .  Treatment duration as above.   ?  Adrenal insufficiency - Noted mild hyponatremia with soft blood pressures.  Patient on fludrocortisone  p.o. received one-time dose of hydrocortisone  100 mg IV with minimal response.  Blood pressure appears stable.   ? pyelonephritis - Leukocytosis, stranding noted on CT scan.  Continues on cefepime  and vanc.  Empiric urine cultures positive for MRSA as well as Enterococcus.   Obstructive sleep apnea - CPAP.   Chronic anticoagulation - Eliquis  on board   Hypokalemia - Replete   Physical debilitation muscle weakness - Evaluated by PT/OT.  Multiple hospitalizations recently.  Likely transition back to CIR.       Consultants: Urology, infectious disease, IR Procedures performed: Exchange of nephrostomy tubes Disposition: Rehabilitation facility Diet recommendation:  Discharge Diet Orders (From admission, onward)     Start     Ordered   07/11/24 0000  Diet - low sodium heart healthy        07/11/24 1019           Regular diet DISCHARGE MEDICATION: Allergies as of 07/11/2024       Reactions   Demerol  [meperidine ] Nausea And Vomiting        Medication List     STOP taking these medications    metFORMIN 500 MG tablet Commonly known as: GLUCOPHAGE   polycarbophil 625 MG tablet Commonly known as: FIBERCON   promethazine  25 MG tablet Commonly known as: PHENERGAN        TAKE these medications    acetaminophen  325 MG tablet Commonly known as: TYLENOL  Take 1-2 tablets (325-650 mg total) by mouth every 4 (four) hours as needed for mild pain (pain score 1-3).   cefTRIAXone  IVPB Commonly known as: ROCEPHIN  Inject 2 g into the vein daily. Indication:  Intra-abdominal  abscess First Dose: Yes Last Day of Therapy:  08/13/24 Labs - Once weekly:  CBC/D and BMP, Labs - Once weekly: ESR and CRP Method of administration: IV Push Method of administration may be changed at the discretion of home infusion pharmacist based upon assessment of the patient and/or caregiver's ability to self-administer the medication ordered.   cyanocobalamin  500 MCG tablet Commonly known as: VITAMIN B12 Take 1 tablet (500 mcg total) by mouth every Monday, Wednesday, and Friday. Start taking on: July 13, 2024 What changed: when to take this   cyclobenzaprine  5 MG tablet Commonly known as: FLEXERIL  Take 1 tablet (5 mg total) by mouth 3 (three) times daily as needed for muscle spasms.   cyproheptadine  4 MG tablet Commonly known as: PERIACTIN  Take 1 tablet (4 mg total) by mouth at bedtime.   Eliquis  5 MG Tabs tablet Generic drug: apixaban  Take 5 mg by mouth 2 (two) times daily.   feeding supplement Liqd Take 237 mLs by mouth 2 (two) times daily between meals. What changed: when to take this   nutrition supplement (JUVEN) Pack Take 1 packet by mouth 2 (two) times daily between meals. What changed: when to take this   FeroSul 325 (65 Fe) MG tablet Generic drug: ferrous sulfate  Take 1 tablet (325 mg total) by mouth 2 (two) times daily with a meal.   Fish Oil 1000 MG Caps Take 1,000 mg by mouth every  Monday, Wednesday, and Friday.   fludrocortisone  0.1 MG tablet Commonly known as: FLORINEF  Take 2 tablets (0.2 mg total) by mouth daily.   Gerhardt's butt cream Crea Apply 1 Application topically 2 (two) times daily.   hydrOXYzine  25 MG tablet Commonly known as: ATARAX  Take 1 tablet (25 mg total) by mouth 3 (three) times daily as needed for anxiety.   ipratropium 0.03 % nasal spray Commonly known as: ATROVENT  Place 1 spray into both nostrils every 8 (eight) hours as needed (allergies).   leptospermum manuka honey Pste paste Apply 1 Application topically daily.    loperamide  2 MG capsule Commonly known as: IMODIUM  Take 2 capsules (4 mg total) by mouth 2 (two) times daily.   melatonin 3 MG Tabs tablet Take 1 tablet (3 mg total) by mouth at bedtime.   Metamucil Fiber 2 g Chew Chew 1 Dose by mouth 2 (two) times daily.   metoCLOPramide  5 MG tablet Commonly known as: REGLAN  Take 1 tablet (5 mg total) by mouth 3 (three) times daily before meals.   midodrine  5 MG tablet Commonly known as: PROAMATINE  Take 3 tablets (15 mg total) by mouth 3 (three) times daily with meals. What changed: when to take this   Mintox Maximum Strength 400-400-40 MG/5ML suspension Generic drug: alum & mag hydroxide-simeth Take 15 mLs by mouth every 4 (four) hours as needed for indigestion.   MULTIVITAMIN PO Take 1 tablet by mouth daily.   oxyCODONE -acetaminophen  5-325 MG tablet Commonly known as: Percocet Take 2 tablets by mouth every 6 (six) hours as needed for severe pain (pain score 7-10).   pantoprazole  40 MG tablet Commonly known as: PROTONIX  Take 40 mg by mouth daily.   prochlorperazine  5 MG tablet Commonly known as: COMPAZINE  Take 1 tablet (5 mg total) by mouth every 6 (six) hours as needed for nausea.   psyllium 95 % Pack Commonly known as: HYDROCIL/METAMUCIL Take 1 packet by mouth daily.   rosuvastatin  10 MG tablet Commonly known as: CRESTOR  Take 1 tablet (10 mg total) by mouth every evening.   sertraline  100 MG tablet Commonly known as: ZOLOFT  Take 1 tablet (100 mg total) by mouth daily.   sodium chloride  0.65 % Soln nasal spray Commonly known as: OCEAN Place 1-2 sprays into both nostrils every 6 (six) hours as needed for congestion (dry nose).   vancomycin  IVPB Inject 1,250 mg into the vein daily. Indication:  Intra-abdominal abscess First Dose: Yes Last Day of Therapy:  08/13/24 Labs - "Sunday/Monday:  CBC/D, BMP, and vancomycin trough. Labs - Thursday:  BMP and vancomycin trough Labs - Once weekly: ESR and CRP Method of  administration:Elastomeric Method of administration may be changed at the discretion of the patient and/or caregiver's ability to self-administer the medication ordered.               Discharge Care Instructions  (From admission, onward)           Start     Ordered   07/11/24 0000  Change dressing on IV access line weekly and PRN  (Home infusion instructions - [facility] )        08" /30/25 1019   07/11/24 0000  Discharge wound care:       Comments: Comments: Buttocks: cleanse wound with NS, pat dry.  Place Xeroform over wound bed and cover with silicone foam dressing.  Change daily.   07/11/24 1019            Discharge Exam: Fredricka Weights   07/09/24  0515 07/10/24 0400 07/11/24 0405  Weight: 89 kg 88.4 kg 87.1 kg   General-appears in no acute distress Heart-S1-S2, regular, no murmur auscultated Lungs-clear to auscultation bilaterally, no wheezing or crackles auscultated Abdomen-soft, nontender, no organomegaly Extremities-no edema in the lower extremities Neuro-alert, oriented x3, no focal deficit noted  Condition at discharge: good  The results of significant diagnostics from this hospitalization (including imaging, microbiology, ancillary and laboratory) are listed below for reference.   Imaging Studies: IR NEPHROSTOMY EXCHANGE BILATERAL Result Date: 07/09/2024 INDICATION: The patient has a complicated urologic history with current bilateral percutaneous nephrostomy tubes and retrograde right-sided ureteral stent. Plan is to exchange 3 catheters and at the end of the procedure capping the right percutaneous nephrostomy tube. EXAM: Bilateral percutaneous nephrostomy tube exchange and right-sided retrograde ureteral stent exchange COMPARISON:  None Available. ANESTHESIA/SEDATION: Moderate (conscious) sedation was employed during this procedure. A total of Versed  see epic record mg and Fentanyl  see epic record mcg was administered intravenously by the  radiology nurse. Total intra-service moderate Sedation Time: See epic record minutes. The patient's level of consciousness and vital signs were monitored continuously by radiology nursing throughout the procedure under my direct supervision. CONTRAST:  See epic record-administered into the collecting system(s) FLUOROSCOPY: Radiation Exposure Index (as provided by the fluoroscopic device): 25.5 mGy Kerma COMPLICATIONS: None immediate. PROCEDURE: Informed written consent was obtained from the patient after a thorough discussion of the procedural risks, benefits and alternatives. All questions were addressed. Maximal Sterile Barrier Technique was utilized including caps, mask, sterile gowns, sterile gloves, sterile drape, hand hygiene and skin antiseptic. A timeout was performed prior to the initiation of the procedure. A nearly prone position, the patient was prepped and draped in usual sterile fashion. Initially dilute contrast was injected in the retrograde right-sided ureteral stent in order to evaluate catheter position. Contrast can be seen in the dilated ureter and the catheter is in good position. The catheter was cut and a stiff Glidewire was advanced under fluoroscopic guidance into the renal pelvis. Catheter was then removed over the guidewire and a new 10 French cook pigtail catheter was advanced into the renal collecting system. Locking mechanism was engaged. Dilute contrast was then injected into the right percutaneous nephrostomy tube. Catheter is in good position in the renal pelvis on the right side. Catheter and retention suture were cut. A stiff Glidewire was advanced under fluoroscopic guidance. Catheter was. A new 10 French pigtail catheter was then advanced over the guidewire and coiled within renal pelvis. Locking mechanism was engaged. Retention suture was engaged and sterile dressing was applied. The right-sided percutaneous nephrostomy tube was then capped. Dilute contrast was then injected  into the left percutaneous nephrostomy tube demonstrating the catheter to be coiled within the renal pelvis. The retention suture and catheter were then cut. A stiff Glidewire was then advanced under fluoroscopic guidance into the catheter and coiled within the renal pelvis. The catheter was completely removed from the patient. A new 10 French percutaneous nephrostomy tube was then advanced over the guidewire and coiled within the renal pelvis. Guidewire was removed. Retention suture and sterile dressing were applied. This catheter was connected to gravity drainage. IMPRESSION: Satisfactory exchange of bilateral percutaneous nephrostomy tubes as well as the retrograde right-sided ureteral stent. The patient will return for routine exchanges in approximately 2 months. The right percutaneous nephrostomy is capped and if trial is successful this catheter will likely be removed. I would recommend this catheter be removed under fluoroscopic guidance in order to ensure no  malpositioning of the right-sided ureteral stent during the removal process. Electronically Signed   By: Cordella Banner   On: 07/09/2024 13:58   IR EXT NEPHROURETERAL CATH EXCHANGE Result Date: 07/09/2024 INDICATION: The patient has a complicated urologic history with current bilateral percutaneous nephrostomy tubes and retrograde right-sided ureteral stent. Plan is to exchange 3 catheters and at the end of the procedure capping the right percutaneous nephrostomy tube. EXAM: Bilateral percutaneous nephrostomy tube exchange and right-sided retrograde ureteral stent exchange COMPARISON:  None Available. ANESTHESIA/SEDATION: Moderate (conscious) sedation was employed during this procedure. A total of Versed  see epic record mg and Fentanyl  see epic record mcg was administered intravenously by the radiology nurse. Total intra-service moderate Sedation Time: See epic record minutes. The patient's level of consciousness and vital signs were monitored  continuously by radiology nursing throughout the procedure under my direct supervision. CONTRAST:  See epic record-administered into the collecting system(s) FLUOROSCOPY: Radiation Exposure Index (as provided by the fluoroscopic device): 25.5 mGy Kerma COMPLICATIONS: None immediate. PROCEDURE: Informed written consent was obtained from the patient after a thorough discussion of the procedural risks, benefits and alternatives. All questions were addressed. Maximal Sterile Barrier Technique was utilized including caps, mask, sterile gowns, sterile gloves, sterile drape, hand hygiene and skin antiseptic. A timeout was performed prior to the initiation of the procedure. A nearly prone position, the patient was prepped and draped in usual sterile fashion. Initially dilute contrast was injected in the retrograde right-sided ureteral stent in order to evaluate catheter position. Contrast can be seen in the dilated ureter and the catheter is in good position. The catheter was cut and a stiff Glidewire was advanced under fluoroscopic guidance into the renal pelvis. Catheter was then removed over the guidewire and a new 10 French cook pigtail catheter was advanced into the renal collecting system. Locking mechanism was engaged. Dilute contrast was then injected into the right percutaneous nephrostomy tube. Catheter is in good position in the renal pelvis on the right side. Catheter and retention suture were cut. A stiff Glidewire was advanced under fluoroscopic guidance. Catheter was. A new 10 French pigtail catheter was then advanced over the guidewire and coiled within renal pelvis. Locking mechanism was engaged. Retention suture was engaged and sterile dressing was applied. The right-sided percutaneous nephrostomy tube was then capped. Dilute contrast was then injected into the left percutaneous nephrostomy tube demonstrating the catheter to be coiled within the renal pelvis. The retention suture and catheter were then  cut. A stiff Glidewire was then advanced under fluoroscopic guidance into the catheter and coiled within the renal pelvis. The catheter was completely removed from the patient. A new 10 French percutaneous nephrostomy tube was then advanced over the guidewire and coiled within the renal pelvis. Guidewire was removed. Retention suture and sterile dressing were applied. This catheter was connected to gravity drainage. IMPRESSION: Satisfactory exchange of bilateral percutaneous nephrostomy tubes as well as the retrograde right-sided ureteral stent. The patient will return for routine exchanges in approximately 2 months. The right percutaneous nephrostomy is capped and if trial is successful this catheter will likely be removed. I would recommend this catheter be removed under fluoroscopic guidance in order to ensure no malpositioning of the right-sided ureteral stent during the removal process. Electronically Signed   By: Cordella Banner   On: 07/09/2024 13:58   CT ABDOMEN PELVIS WO CONTRAST Result Date: 07/02/2024 CLINICAL DATA:  Suspected sepsis EXAM: CT ABDOMEN AND PELVIS WITHOUT CONTRAST TECHNIQUE: Multidetector CT imaging of the abdomen and pelvis  was performed following the standard protocol without IV contrast. RADIATION DOSE REDUCTION: This exam was performed according to the departmental dose-optimization program which includes automated exposure control, adjustment of the mA and/or kV according to patient size and/or use of iterative reconstruction technique. COMPARISON:  Chest x-ray 07/01/2024, CT 06/17/2024, 05/12/2024 FINDINGS: Lower chest: Lung bases demonstrates interval nodular airspace disease and ground-glass density in the left greater than right lung bases. No pleural effusion. Coronary vascular calcification. Hepatobiliary: No focal liver abnormality is seen. Status post cholecystectomy. No biliary dilatation. Pancreas: Chronic pancreatitis. No acute inflammation. Stable 10 mm low-density  lesion at the distal pancreas, series 3, image 24. Spleen: Normal in size without focal abnormality. Adrenals/Urinary Tract: Adrenal glands are normal. Patient is status post cysto prostatectomy with right abdominal ileal conduit. Bilateral percutaneous nephrostomy catheters remain in place. Right-sided ureteral stent, extending from the renal pelvis to the right abdominal urostomy. Residual mild to moderate right-sided hydronephrosis but slightly diminished compared to most recent prior CT. Atrophic left kidney. Interval moderate severe left hydronephrosis and hydroureter, ureter dilated down to the ileal conduit, stricture was demonstrated on nephrostogram 05/11/2024. Small gas bubble in the left renal pelvis. Slight increased left perinephric stranding with mild stranding about the now dilated left ureter. Stomach/Bowel: Stomach is nondistended. No dilated small bowel. Contrast within the colon. No acute bowel wall thickening. Right abdominal ileal conduit. Separate right abdominal ileostomy. 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 Vascular/Lymphatic: Aortic atherosclerosis. No aneurysm. No suspicious lymph nodes. Reproductive: Prostatectomy. Other: Negative for pelvic effusion. Fat containing right inguinal hernia. Drainage catheter in the subcutaneous soft tissues of the right abdominal wall near the urostomy. Decreased fluid collection within the right ventral pelvic wall, limited characterization without contrast. Sample measurement right laterally measures about 2.5 cm on series 3, image 66, previously 3.4 cm. Musculoskeletal: No acute osseous abnormality. Old superior endplate deformity at L1 IMPRESSION: 1. Interval development of moderate to severe left hydronephrosis and hydroureter, ureter dilated down to the ileal conduit, stricture was demonstrated on nephrostogram 05/11/2024. Slight increased left perinephric stranding with mild stranding about the now dilated left ureter, correlate  for superimposed infection. Correlate also for function of the left percutaneous nephrostomy tube. 2. Bilateral percutaneous nephrostomy catheters remain in place. Right-sided ureteral stent, extending from the renal pelvis to the right abdominal urostomy. Residual mild to moderate right-sided hydronephrosis but slightly diminished compared to most recent prior CT. 3. Interval nodular airspace disease and ground-glass density in the left greater than right lung bases, suspect for pneumonia and or aspiration. 4. Drainage catheter in the subcutaneous soft tissues of the right abdominal wall near the urostomy. Decreased fluid collection within the right ventral pelvic wall, limited characterization without contrast. 5. Aortic atherosclerosis. Aortic Atherosclerosis (ICD10-I70.0). Electronically Signed   By: Luke Bun M.D.   On: 07/02/2024 01:57   DG Chest Port 1 View if patient is in a treatment room. Result Date: 07/01/2024 CLINICAL DATA: Suspected sepsis EXAM: PORTABLE CHEST 1 VIEW COMPARISON:  Chest radiograph June 12, 2024. FINDINGS: The heart size and mediastinal contours are within normal limits. Both lungs are clear. The visualized skeletal structures are unremarkable. IMPRESSION: No active disease. Electronically Signed   By: Megan  Zare M.D.   On: 07/01/2024 17:45   DG Loopogram Result Date: 06/17/2024 INDICATION: Patient with a history of a radical cystectomy/ileal conduit urinary diversion with a known leak of the ileal conduit with bilateral nephrostomy tubes placed 05/04/24 as well as placement of a right retrograde ureteral stent 05/14/24. Request  for loopogram to assess for further evidence of urinary leak. COMPARISON:  DG LOOPOGRAM 05/12/24 CONTRAST:  A total of 60 mL Isovue -300 administered was administered into the bilateral nephrostomy tubes as well as retrograde ureteral stent. FLUOROSCOPY TIME:  21 mGy COMPLICATIONS: None immediate. TECHNIQUE: Informed written consent was obtained from the  patient after a discussion of the risks, benefits and alternatives to treatment. Questions regarding the procedure were encouraged and answered. A timeout was performed prior to the initiation of the procedure. A pre procedural spot fluoroscopic image was obtained. The bilateral flank and external portions of existing nephrostomy catheters and right ureteral stent were prepped and sterilized. Antegrade injection of 20 mL of contrast into the existing right nephrostomy catheter demonstrated appropriate positioning within the renal pelvis. After several minutes, no contrast was identified within the right ureter or ileal conduit. Antegrade injection of 20 mL of contrast into the existing left nephrostomy catheter demonstrated appropriate positioning within the renal pelvis. After several minutes, a small volume of contrast was noted to flow through the left ureter into the ileal conduit. Retrograde injection of the right ureteral stent opacified the right ureter and right renal collecting system. No contrast extravasation observed. Temporary dressing applied to the ileal conduit ostomy site. Bilateral nephrostomy tubes were capped as requested by ordering MD. The patient tolerated the procedure well without immediate postprocedural complication. FINDINGS: The existing right nephrostomy catheter is appropriately positioned and functioning. Right retrograde ureteral stent in place through ileal conduit. IMPRESSION: 1. Right nephrostogram via the right nephrostomy tube and right ureteral stent demonstrates normally patent ureter and ureteral anastomosis with the ileal conduit. No right-sided urine leak demonstrated. 2. Left nephrostogram through left nephrostomy tube appears in good position with evidence of contrast flow to the distal ureter. No left-sided urine leak demonstrated. Performed by: Kacie Matthews PA-C Electronically Signed   By: Ester Sides M.D.   On: 06/17/2024 16:00   CT ABDOMEN PELVIS W WO  CONTRAST Result Date: 06/17/2024 CLINICAL DATA:  Abdominal pain, postop, ileal conduit leak. EXAM: CT ABDOMEN AND PELVIS WITHOUT AND WITH CONTRAST TECHNIQUE: Multidetector CT imaging of the abdomen and pelvis was performed following the standard protocol before and following the bolus administration of intravenous contrast. RADIATION DOSE REDUCTION: This exam was performed according to the departmental dose-optimization program which includes automated exposure control, adjustment of the mA and/or kV according to patient size and/or use of iterative reconstruction technique. CONTRAST:  75mL OMNIPAQUE  IOHEXOL  350 MG/ML SOLN COMPARISON:  05/12/2024. FINDINGS: Lower chest: A few tiny pulmonary nodules are unchanged from 08/29/2018 and are considered benign. Heart size normal. No pericardial effusion. No pleural effusion. Distal esophagus is grossly unremarkable. Hepatobiliary: Liver is grossly unremarkable. Cholecystectomy. No biliary ductal dilatation. Pancreas: Chronic calcific pancreatitis. 1.3 cm low-attenuation lesion in the pancreatic tail (3/21), unchanged from 08/29/2018, compatible with a pseudocyst. Spleen: Negative. Adrenals/Urinary Tract: Negative. Stomach/Bowel: Bilateral percutaneous nephrostomies with contrast in the collecting systems bilaterally, secondary to a loopogram performed prior to this study. Possible small stone in the right kidney (3/24). Persistent moderate to severe dilatation of the right intrarenal collecting system and right ureter with a double-J right ureteral stent in place, proximal loop in the right renal pelvis and distal portion the on the right lower quadrant ileal conduit. Left kidney is atrophic and scarred. Partial opacification of the left ureter which is decompressed. Ileal conduit in the right lower quadrant which may be stenotic as it traverses the abdominal wall, as on 05/12/2024. No extraluminal contrast. Cysto prostatectomy. Vascular/Lymphatic:  Stomach is unremarkable.  Right lower quadrant ileostomy and separate ileal conduit. Small bowel and colon are otherwise unremarkable. Appendix is not readily visualized. Reproductive: Prostatectomy. Other: Small right inguinal hernia contains fat. Enlarging broad fluid collection along the right ventral pelvic wall, just above the ileal conduit, measuring 3.4 x 21.6 cm. A percutaneous drain is seen within. No free fluid or extraluminal contrast. Musculoskeletal: Degenerative changes in the spine. Old L1 compression fracture. IMPRESSION: 1. 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 2. Enlarging fluid collection along the right ventral pelvic abdominal wall with a percutaneous drain in place, indicative of an abscess. 3. 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 4. Right lower quadrant ileostomy. 5. Probable small right renal stone. 6. Chronic calcific pancreatitis. Electronically Signed   By: Newell Eke M.D.   On: 06/17/2024 15:47   DG Chest 2 View Result Date: 06/12/2024 CLINICAL DATA:  10031 Cough 10031 EXAM: CHEST - 2 VIEW COMPARISON:  Chest x-ray 05/26/2024, CT chest 04/14/2024 FINDINGS: The heart and mediastinal contours are within normal limits. No focal consolidation. No pulmonary edema. No pleural effusion. No pneumothorax. No acute osseous abnormality. IMPRESSION: No active cardiopulmonary disease. Electronically Signed   By: Morgane  Naveau M.D.   On: 06/12/2024 18:56    Microbiology: Results for orders placed or performed during the hospital encounter of 07/01/24  Culture, blood (Routine x 2)     Status: None   Collection Time: 07/01/24  5:42 PM   Specimen: BLOOD  Result Value Ref Range Status   Specimen Description BLOOD LEFT ANTECUBITAL  Final   Special Requests   Final    BOTTLES DRAWN AEROBIC AND ANAEROBIC Blood Culture results may not be optimal due to an inadequate volume of  blood received in culture bottles   Culture   Final    NO GROWTH 5 DAYS Performed at [facility], [address] [place], [place] [address]    Report Status 07/06/2024 FINAL  Final  Culture, blood (Routine x 2)     Status: None   Collection Time: 07/01/24  6:01 PM   Specimen: BLOOD RIGHT HAND  Result Value Ref Range Status   Specimen Description BLOOD RIGHT HAND  Final   Special Requests   Final    BOTTLES DRAWN AEROBIC AND ANAEROBIC Blood Culture results may not be optimal due to an inadequate volume of blood received in culture bottles   Culture   Final    NO GROWTH 5 DAYS Performed at [facility], [address] [place], [place] [address]    Report Status 07/06/2024 FINAL  Final  Urine Culture     Status: Abnormal   Collection Time: 07/01/24 10:45 PM   Specimen: Urine, Random  Result Value Ref Range Status   Specimen Description URINE, RANDOM  Final   Special Requests   Final    NONE Reflexed from [id number] Performed at [facility], [address] [place], [place] [address]    Culture (A)  Final    >=100,000 COLONIES/mL METHICILLIN RESISTANT STAPHYLOCOCCUS AUREUS >=100,000 COLONIES/mL ENTEROCOCCUS FAECIUM    Report Status 07/04/2024 FINAL  Final   Organism ID, Bacteria METHICILLIN RESISTANT STAPHYLOCOCCUS AUREUS (A)  Final   Organism ID, Bacteria ENTEROCOCCUS FAECIUM (A)  Final      Susceptibility   Enterococcus faecium - MIC*    AMPICILLIN >=32 RESISTANT Resistant  NITROFURANTOIN 64 INTERMEDIATE Intermediate     VANCOMYCIN  <=0.5 SENSITIVE Sensitive     * >=100,000 COLONIES/mL ENTEROCOCCUS FAECIUM   Methicillin resistant staphylococcus aureus - MIC*    CIPROFLOXACIN  >=8 RESISTANT Resistant     GENTAMICIN  <=0.5 SENSITIVE Sensitive     NITROFURANTOIN <=16 SENSITIVE Sensitive     OXACILLIN >=4 RESISTANT Resistant     TETRACYCLINE <=1 SENSITIVE Sensitive     VANCOMYCIN  1 SENSITIVE Sensitive     TRIMETH /SULFA  >=320 RESISTANT Resistant     RIFAMPIN  <=0.5 SENSITIVE Sensitive     Inducible Clindamycin  NEGATIVE Sensitive     LINEZOLID  2 SENSITIVE Sensitive     * >=100,000 COLONIES/mL METHICILLIN RESISTANT STAPHYLOCOCCUS AUREUS  MIC (1 Drug)-Body fluid with gram stain; 07/02/2024; JP Drain; E. coli; Other; Omadacycline Gabriel)     Status: None   Collection Time: 07/02/24 10:26 AM   Specimen: JP Drain  Result Value Ref Range Status   Min Inhibitory Conc (1 Drug) Preliminary report  Final    Comment: (NOTE) Performed At: [facility] [employer] [address] [place], [place] [id number] Jennette Shorter MD Ey:[id number]    Source WOUND  Final    Comment: Performed at [facility], [address] [place], [place] [address]  MIC Result     Status: None   Collection Time: 07/02/24 10:26 AM  Result Value Ref Range Status   Result 1 (MIC) Escherichia coli  Final    Comment: (NOTE) Identification performed by account, not confirmed by this laboratory. OMADACYCLINE Performed At: [facility] [address] [place], [place] [id number] Jennette Shorter MD Ey:[id number]   Body fluid culture w Gram Stain     Status: None (Preliminary result)   Collection Time: 07/02/24 12:01 PM   Specimen: JP Drain; Body Fluid  Result Value Ref Range Status   Specimen Description JP DRAINAGE  Final   Special Requests   Final    19FR BLAKE DRAIN IN RIGHT LOWER QUADRANT OF ABDOMEN   Gram Stain   Final    ABUNDANT WBC PRESENT, PREDOMINANTLY PMN GRAM NEGATIVE RODS ABUNDANT GRAM POSITIVE RODS    Culture   Final    ABUNDANT ESCHERICHIA COLI ABUNDANT DIPHTHEROIDS(CORYNEBACTERIUM SPECIES) Standardized susceptibility testing for this organism is not available. Sent to [facility] for further susceptibility testing. Performed at [facility], [address], [place], [place] [address]    Report Status PENDING  Incomplete   Organism ID, Bacteria ESCHERICHIA COLI  Final      Susceptibility   Escherichia coli - MIC*    AMPICILLIN >=32 RESISTANT  Resistant     CEFAZOLIN  (NON-URINE) 8 RESISTANT Resistant     CEFEPIME  <=0.12 SENSITIVE Sensitive     ERTAPENEM <=0.12 SENSITIVE Sensitive     CEFTRIAXONE  <=0.25 SENSITIVE Sensitive     CIPROFLOXACIN  >=4 RESISTANT Resistant     GENTAMICIN  <=1 SENSITIVE Sensitive     MEROPENEM <=0.25 SENSITIVE Sensitive     TRIMETH /SULFA  >=320 RESISTANT Resistant     AMPICILLIN/SULBACTAM 16 INTERMEDIATE Intermediate     PIP/TAZO Value in next row Sensitive ug/mL     <=4 SENSITIVEThis is a modified FDA-approved test that has been validated and its performance characteristics determined by the reporting laboratory.  This laboratory is certified under the Clinical Laboratory Improvement Amendments CLIA as qualified to perform high complexity clinical laboratory testing.    * ABUNDANT ESCHERICHIA COLI    Labs: CBC: Recent Labs  Lab 07/05/24 0239 07/06/24 0259 07/07/24 0323 07/08/24 0246  WBC 8.4 7.5  9.1 8.7  HGB 11.4* 10.3* 10.5* 10.8*  HCT 35.4* 31.7* 33.0* 32.7*  MCV 84.3 84.3 85.9 84.5  PLT 204 221 214 228   Basic Metabolic Panel: Recent Labs  Lab 07/05/24 0239 07/06/24 0259 07/07/24 0323 07/08/24 0246 07/09/24 0252  NA 131* 133* 135 138 138  K 4.2 3.8 3.4* 3.2* 3.5  CL 103 108 108 110 112*  CO2 21* 19* 19* 18* 18*  GLUCOSE 88 91 78 87 77  BUN 53* 46* 42* 39* 34*  CREATININE 1.52* 1.37* 1.42* 1.45* 1.48*  CALCIUM  9.4 9.5 9.4 9.2 9.6  MG 1.8 1.8 1.7  --   --   PHOS  --  1.8* 2.2*  --  2.5   Liver Function Tests: Recent Labs  Lab 07/05/24 0239 07/06/24 0259 07/07/24 0323 07/09/24 0252  AST 14* 14* 18 21  ALT 16 15 18 25   ALKPHOS 68 60 60 73  BILITOT 0.5 0.4 0.4 0.5  PROT 5.4* 5.0* 4.8* 5.1*  ALBUMIN  2.0* 1.7* 1.6* 1.9*   CBG: Recent Labs  Lab 07/05/24 0815  GLUCAP 92    Discharge time spent: greater than 30 minutes.  Signed: Sabas GORMAN Brod, MD [facility] 07/11/2024

## 2024-07-11 NOTE — Progress Notes (Signed)
 Inpatient Rehabilitation Admission Medication Review by a Pharmacist  A complete drug regimen review was completed for this patient to identify any potential clinically significant medication issues.  High Risk Drug Classes Is patient taking? Indication by Medication  Antipsychotic Yes, as an intravenous medication Prochlorperazine  - prn nausea  Anticoagulant Yes Apixaban  - PE  Antibiotic Yes, as an intravenous medication Vancomycin , ceftriaxone  - abdominal wall infection  Opioid Yes Percocet - prn pain  Antiplatelet No   Hypoglycemics/insulin  No   Vasoactive Medication No   Chemotherapy No   Other Yes Vitamin B12, MVI, omega-3,  Cyproheptadine  - appetite   Fludrocortisone  - adrenal insufficiency  Loperamide  - loose stools Melatonin - sleep Metoclopramide  - nausea/vomiting Midodrine  - hypotension Rosuvastatin  - HLD Sertraline  - mood  PRN: Acetaminophen  - pain Maalox - indigestion Bisacodyl , fleet - constipation Cyclobenzaprine  - muscle spasm Diphenhydramine  - itching Robitussin DM - cough Hydroxyzine  - anxiety Ipratropium nasal, Ocean nasal - allergies/congestion     Type of Medication Issue Identified Description of Issue Recommendation(s)  Drug Interaction(s) (clinically significant)     Duplicate Therapy     Allergy     No Medication Administration End Date  Antibiotics to end 08/13/24 per OPAT order Entered stop date  Incorrect Dose     Additional Drug Therapy Needed     Significant med changes from prior encounter (inform family/care partners about these prior to discharge). PTA medication not resumed: pantoprazole  Consider resuming PTA medications during CIR admit or upon discharge as appropriate    Other       Clinically significant medication issues were identified that warrant physician communication and completion of prescribed/recommended actions by midnight of the next day:  No  Name of provider notified for urgent issues identified:   Provider  Method of Notification:     Pharmacist comments:   Time spent performing this drug regimen review (minutes):  25   Kristopher Thompson, PharmD, BCPS Clinical Pharmacist Clinical phone for 07/11/2024 is [phone] 07/11/2024 3:08 PM

## 2024-07-11 NOTE — Progress Notes (Incomplete)
 [facility]  PROGRESS NOTE  Kristopher Thompson FMW:[id number] DOB: 1951/06/24 DOA: 07/01/2024 PCP: [name], MD   Brief HPI:   73 year old with complicated history of HTN, CHFpEF, CAD, T2DM, PE on Eliquis , prostate cancer, bladder cancer who was admitted back in late May for incarcerated incisional hernia and urostomy ileal conduit revision. Postoperative course was complicated by bowel perforation, sepsis, ICU admission with intubation, requiring pressors and CRRT. Patient had bilateral nephrostomy tubes placed as well and had been in inpatient rehab until August 8 of this year. At that time a study was done that supported capping the nephrostomy tubes. Patient was sent home and was doing well including ambulating on his own, was tolerating p.o., had good urine output until approximately 3 days ago. reports decreased urine output from about a liter a day to down to 300 in the last 24 hours. Found to have aki and possible pyelonephritis.     Assessment/Plan:   severe sepsis-infected abdominal wall -Sepsis physiology has resolved -Presented with sinus tachycardia, encephalopathy, pyonephritis -Started on broad-spectrum antibiotics -Etiology infected abdominal wall with purulent material in the drain, this was removed by general surgery on 8/21 culture tip noted abundant E. coli and Corynebacterium, E. coli was resistant to ampicillin, cefazolin , Cipro  and Bactrim . -ID consulted, plan to continue  ceftriaxone  and vancomycin  Plan 3-6 months treatment  6 weeks iv vanc, ceftriaxone   6 more weeks ?dalbavancin vs transitioning to tedezolid/linezolid ; ceftriaxone  -> ?omado pending susceptibility   Acute kidney injury - Creatinine 3.57 on presentation, markedly above baseline 1-1.3.  Multi factorial etiology most likely secondary to ongoing urinary obstruction.  Percutaneous nephrostomy tubes uncapped and IV fluids on board.  Appears to be resolved at this time after IV fluid hydration.    Creatinine improved to 1.48   Concern for urinary obstruction with L>R hydronephrosis - Nephrostomy tubes uncapped.  Evaluated by urology and following closely.  Urine output excellent bilaterally.  Per urology, right nephrostomy tube capped yesterday.  Very slight bump in urine creatinine, so watching kidney function closely.   -Underwent exchange of bilateral nephrostomy tubes today   Bladder cancer s/p radical cystectomy and ileal conduit - Initial surgery 07/2017, subsequent lower complicated course after with urine leak, ureteral anastomosis, parastomal hernia incarceration, bowel perforation with abscess, creation of end ileostomy, and hydronephrosis requiring bilateral nephrostomy tubes.  Nephrostomy tubes initially capped as urine output was good.  However given AKI and decreased urine output, now uncapped.  Evaluation by urology, general surgery.  No concern for urine leak.  Previous abdominal wall drain for bowel perforation and abscess removed by general surgery 8/21.  Culture noting abundant e. Coli and Corynebacterium E. coli resistant to ampicillin, cefazolin , Cipro , Bactrim .  Remains on ceftriaxone , vancomycin .  Treatment duration as above.   ?  Adrenal insufficiency - Noted mild hyponatremia with soft blood pressures.  Patient on fludrocortisone  p.o. received one-time dose of hydrocortisone  100 mg IV with minimal response.  Blood pressure appears stable.  ? pyelonephritis - Leukocytosis, stranding noted on CT scan.  Continues on cefepime  and vanc.  Empiric urine cultures positive for MRSA as well as Enterococcus.   Obstructive sleep apnea - CPAP.   Chronic anticoagulation - Eliquis  on board  Hypokalemia - Replete   Physical debilitation muscle weakness - Evaluated by PT/OT.  Multiple hospitalizations recently.  Likely transition back to CIR.  Working closely with case management on disposition planning.      Medications     apixaban   5 mg Oral BID  Chlorhexidine   Gluconate Cloth  6 each Topical Daily   cyanocobalamin   500 mcg Oral Q M,W,F   cyproheptadine   4 mg Oral QHS   feeding supplement  237 mL Oral BID BM   fludrocortisone   0.2 mg Oral Daily   Gerhardt's butt cream  1 Application Topical BID   leptospermum manuka honey  1 Application Topical Daily   loperamide   4 mg Oral TID   melatonin  3 mg Oral QHS   metoCLOPramide   5 mg Oral TID AC   midodrine   15 mg Oral TID WC   multivitamin with minerals  1 tablet Oral Daily   nutrition supplement (JUVEN)  1 packet Oral BID BM   omega-3 acid ethyl esters  1 g Oral QODAY   mouth rinse  15 mL Mouth Rinse 4 times per day   psyllium  1 packet Oral Daily   rosuvastatin   10 mg Oral QPM   sertraline   100 mg Oral Daily   sodium chloride  flush  5 mL Intracatheter Q8H     Data Reviewed:   CBG:  Recent Labs  Lab 07/05/24 0815  GLUCAP 92    SpO2: 97 % O2 Flow Rate (L/min): 2 L/min FiO2 (%): 21 %    Vitals:   07/10/24 2128 07/10/24 2347 07/11/24 0405 07/11/24 0713  BP:  121/69 (!) 103/59 (!) 115/58  Pulse: 64 72 72 77  Resp: 16 18 17 20   Temp:  97.9 F (36.6 C) 98.8 F (37.1 C) 97.8 F (36.6 C)  TempSrc:  Oral Oral Oral  SpO2: 97% 96% 98% 97%  Weight:   87.1 kg   Height:          Data Reviewed:  Basic Metabolic Panel: Recent Labs  Lab 07/05/24 0239 07/06/24 0259 07/07/24 0323 07/08/24 0246 07/09/24 0252  NA 131* 133* 135 138 138  K 4.2 3.8 3.4* 3.2* 3.5  CL 103 108 108 110 112*  CO2 21* 19* 19* 18* 18*  GLUCOSE 88 91 78 87 77  BUN 53* 46* 42* 39* 34*  CREATININE 1.52* 1.37* 1.42* 1.45* 1.48*  CALCIUM  9.4 9.5 9.4 9.2 9.6  MG 1.8 1.8 1.7  --   --   PHOS  --  1.8* 2.2*  --  2.5    CBC: Recent Labs  Lab 07/05/24 0239 07/06/24 0259 07/07/24 0323 07/08/24 0246  WBC 8.4 7.5 9.1 8.7  HGB 11.4* 10.3* 10.5* 10.8*  HCT 35.4* 31.7* 33.0* 32.7*  MCV 84.3 84.3 85.9 84.5  PLT 204 221 214 228    LFT Recent Labs  Lab 07/05/24 0239 07/06/24 0259 07/07/24 0323  07/09/24 0252  AST 14* 14* 18 21  ALT 16 15 18 25   ALKPHOS 68 60 60 73  BILITOT 0.5 0.4 0.4 0.5  PROT 5.4* 5.0* 4.8* 5.1*  ALBUMIN  2.0* 1.7* 1.6* 1.9*     Antibiotics: Anti-infectives (From admission, onward)    Start     Dose/Rate Route Frequency Ordered Stop   07/11/24 0900  vancomycin  (VANCOREADY) IVPB 1250 mg/250 mL        1,250 mg 166.7 mL/hr over 90 Minutes Intravenous Every 48 hours 07/11/24 0814     07/09/24 0806  vancomycin  variable dose per unstable renal function (pharmacist dosing)  Status:  Discontinued         Does not apply See admin instructions 07/09/24 0806 07/11/24 0814   07/07/24 0000  cefTRIAXone  (ROCEPHIN ) 2 g in sodium chloride  0.9 % 100 mL IVPB  2 g 200 mL/hr over 30 Minutes Intravenous Every 24 hours 07/06/24 2249     07/06/24 1600  vancomycin  (VANCOREADY) IVPB 1250 mg/250 mL  Status:  Discontinued        1,250 mg 166.7 mL/hr over 90 Minutes Intravenous Every 24 hours 07/06/24 1502 07/09/24 0806   07/03/24 1600  vancomycin  (VANCOCIN ) IVPB 1000 mg/200 mL premix  Status:  Discontinued        1,000 mg 200 mL/hr over 60 Minutes Intravenous Every 24 hours 07/03/24 1422 07/06/24 1502   07/03/24 1600  ceFEPIme  (MAXIPIME ) 2 g in sodium chloride  0.9 % 100 mL IVPB  Status:  Discontinued        2 g 200 mL/hr over 30 Minutes Intravenous Every 12 hours 07/03/24 1423 07/06/24 2249   07/03/24 1345  linezolid  (ZYVOX ) IVPB 600 mg  Status:  Discontinued        600 mg 300 mL/hr over 60 Minutes Intravenous Every 12 hours 07/03/24 1252 07/03/24 1306   07/02/24 1800  ceFEPIme  (MAXIPIME ) 2 g in sodium chloride  0.9 % 100 mL IVPB  Status:  Discontinued        2 g 200 mL/hr over 30 Minutes Intravenous Every 24 hours 07/02/24 0323 07/03/24 1423   07/02/24 0345  vancomycin  (VANCOREADY) IVPB 2000 mg/400 mL        2,000 mg 200 mL/hr over 120 Minutes Intravenous  Once 07/02/24 0324 07/02/24 0600   07/01/24 2030  ceFEPIme  (MAXIPIME ) 2 g in sodium chloride  0.9 % 100 mL IVPB         2 g 200 mL/hr over 30 Minutes Intravenous  Once 07/01/24 2020 07/01/24 2308        DVT prophylaxis: Apixaban   Code Status: Full code  Family Communication: Discussed with patient's wife at bedside   CONSULTS urology, IR, ID, general surgery   Subjective      Objective    Physical Examination:   Status is: Inpatient:             Kristopher Thompson   [facility] If 7PM-7AM, please contact night-coverage at [email or web address], Office  [phone]   07/11/2024, 8:28 AM  LOS: 10 days

## 2024-07-11 NOTE — Progress Notes (Signed)
 Pharmacy Antibiotic Note  Kristopher Thompson is a 73 y.o. male admitted on 07/01/2024 with intra- abdominal wall infection.  Pharmacy has been consulted for vancomycin  dosing (he is also on rocephin )  -vancomycin  random= 23 -Initial vancomycin  level was 52 and this was likely the effect of accumulation -ID also following: continuing antibiotics until 10/2  Plan: -Vancomycin  1250mg  IV q48hr -Will follow renal function and clinical progress   Height: 5' 11 (180.3 cm) Weight: 87.1 kg (192 lb 0.3 oz) IBW/kg (Calculated) : 75.3  Temp (24hrs), Avg:98.5 F (36.9 C), Min:97.8 F (36.6 C), Max:99.6 F (37.6 C)  Recent Labs  Lab 07/05/24 0239 07/05/24 2142 07/06/24 0259 07/07/24 0323 07/08/24 0246 07/08/24 1749 07/08/24 2056 07/09/24 0252 07/09/24 1557 07/11/24 0300  WBC 8.4  --  7.5 9.1 8.7  --   --   --   --   --   CREATININE 1.52*  --  1.37* 1.42* 1.45*  --   --  1.48*  --   --   LATICACIDVEN  --  1.0  --   --   --   --   --   --   --   --   VANCOTROUGH  --   --   --   --   --   --   --   --  39*  --   VANCOPEAK  --   --   --   --   --  >60*  --   --   --   --   VANCORANDOM  --   --   --   --   --   --  50*  --   --  23    Estimated Creatinine Clearance: 48.1 mL/min (A) (by C-G formula based on SCr of 1.48 mg/dL (H)).    Allergies  Allergen Reactions   Demerol  [Meperidine ] Nausea And Vomiting    Antimicrobials this admission: Vancomycin  8/21 >  Cefepime  8/20>8/25 Ceftrix per ID 8/25>  Dose adjustments this admission: 8/22 VR 18 at 0835 - ~40 hrs after LD but AKI > improving > maint dose begun 1gm q24h 8/26: SCr improved to 1.37>>1gm > 1250 mg IV q24h for eAUC 406 8/27 VP  52 - vancomycin  held 8/28 VT 39 -  8/30: VR 23  Microbiology results: 8/20 blood: neg 8/20 urine: >100K/ml MRSA (MIC to Vanc 1; also sens TCN and linezolid  but R to cipro  and septra ) + >100K/ml Enterococcus faecium (MIC to Vanc < 0.5) 8/21 JP drainage (RLQ): abundant E coli (send Cefepime ,  Erta, CRO, Mero, Zosyn ) and Corynebacterium  Thank you for allowing pharmacy to be a part of this patient's care.  Prentice Poisson, PharmD Clinical Pharmacist **Pharmacist phone directory can now be found on [email or web address] ([email or web address]).  Listed under [facility].

## 2024-07-12 ENCOUNTER — Encounter (HOSPITAL_COMMUNITY): Payer: Self-pay | Admitting: Physical Medicine and Rehabilitation

## 2024-07-12 ENCOUNTER — Other Ambulatory Visit: Payer: Self-pay

## 2024-07-12 LAB — COMPREHENSIVE METABOLIC PANEL WITH GFR
ALT: 20 U/L (ref 0–44)
AST: 20 U/L (ref 15–41)
Albumin: 2.1 g/dL — ABNORMAL LOW (ref 3.5–5.0)
Alkaline Phosphatase: 78 U/L (ref 38–126)
Anion gap: 11 (ref 5–15)
BUN: 42 mg/dL — ABNORMAL HIGH (ref 8–23)
CO2: 18 mmol/L — ABNORMAL LOW (ref 22–32)
Calcium: 9.8 mg/dL (ref 8.9–10.3)
Chloride: 109 mmol/L (ref 98–111)
Creatinine, Ser: 1.57 mg/dL — ABNORMAL HIGH (ref 0.61–1.24)
GFR, Estimated: 47 mL/min — ABNORMAL LOW (ref >60)
Glucose, Bld: 83 mg/dL (ref 70–99)
Potassium: 3.6 mmol/L (ref 3.5–5.1)
Sodium: 138 mmol/L (ref 135–145)
Total Bilirubin: 0.5 mg/dL (ref 0.0–1.2)
Total Protein: 5.6 g/dL — ABNORMAL LOW (ref 6.5–8.1)

## 2024-07-12 LAB — CBC WITH DIFFERENTIAL/PLATELET
Abs Immature Granulocytes: 0.17 K/uL — ABNORMAL HIGH (ref 0.00–0.07)
Basophils Absolute: 0.1 K/uL (ref 0.0–0.1)
Basophils Relative: 1 %
Eosinophils Absolute: 0.6 K/uL — ABNORMAL HIGH (ref 0.0–0.5)
Eosinophils Relative: 5 %
HCT: 36.7 % — ABNORMAL LOW (ref 39.0–52.0)
Hemoglobin: 11.8 g/dL — ABNORMAL LOW (ref 13.0–17.0)
Immature Granulocytes: 1 %
Lymphocytes Relative: 14 %
Lymphs Abs: 1.7 K/uL (ref 0.7–4.0)
MCH: 27.1 pg (ref 26.0–34.0)
MCHC: 32.2 g/dL (ref 30.0–36.0)
MCV: 84.2 fL (ref 80.0–100.0)
Monocytes Absolute: 0.9 K/uL (ref 0.1–1.0)
Monocytes Relative: 8 %
Neutro Abs: 8.4 K/uL — ABNORMAL HIGH (ref 1.7–7.7)
Neutrophils Relative %: 71 %
Platelets: 236 K/uL (ref 150–400)
RBC: 4.36 MIL/uL (ref 4.22–5.81)
RDW: 14.5 % (ref 11.5–15.5)
WBC: 11.9 K/uL — ABNORMAL HIGH (ref 4.0–10.5)
nRBC: 0 % (ref 0.0–0.2)

## 2024-07-12 LAB — PHOSPHORUS: Phosphorus: 3.5 mg/dL (ref 2.5–4.6)

## 2024-07-12 LAB — PREALBUMIN: Prealbumin: 17 mg/dL — ABNORMAL LOW (ref 18–38)

## 2024-07-12 LAB — MAGNESIUM: Magnesium: 1.7 mg/dL (ref 1.7–2.4)

## 2024-07-12 NOTE — Evaluation (Signed)
 Occupational Therapy Assessment and Plan  Patient Details  Name: Kristopher Thompson MRN: [id number] Date of Birth: 22-Feb-1951  OT Diagnosis: abnormal posture and muscle weakness (generalized) Rehab Potential: Rehab Potential (ACUTE ONLY): Good ELOS: 10-14 days   Today's Date: 07/12/2024 OT Individual Time: 0100-0215 OT Individual Time Calculation (min): 75 min     Hospital Problem: Principal Problem:   Debility   Past Medical History:  Past Medical History:  Diagnosis Date   At risk for sleep apnea    12-25-2017   STOP-BANG SCORE= 5   --- SENT TO PCP   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   moderate atypia - right low back   Atypical nevus 04/04/2007   moderate to marked - right upper back (wider shave)   Atypical nevus 04/04/2007   moderate atypia - center chest (wider shave)   Atypical nevus 04/04/2007   slight atypia - right thigh   Atypical nevus 11/29/2011   mild atypia - center upper back   Atypical nevus 11/29/2011   mild atypia - center chest   Bacteremia due to Klebsiella pneumoniae 10/09/2017   Bladder cancer (HCC) dx 07/2017   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 denies   Sleep apnea    cpap   Squamous cell carcinoma of skin 05/22/2013   left cheek - CX3 + 5FU   Wears glasses    Past Surgical History:  Past Surgical History:  Procedure Laterality Date   ABDOMINAL SURGERY     APPENDECTOMY  1972   BOWEL RESECTION N/A  04/20/2024   Procedure: SMALL BOWEL RESECTION;  Surgeon: [name], MD;  Location: [facility];  Service: General;  Laterality: N/A;   CHOLECYSTECTOMY  1985   COLONOSCOPY N/A 06/29/2021   Procedure: COLONOSCOPY;  Surgeon: Debby Hila, MD;  Location[facility];  Service: Endoscopy;  Laterality: N/A;   COLOSTOMY REVERSAL N/A 01/08/2018   Procedure: COLOSTOMY REVERSAL;  Surgeon: Debby Hila, MD;  Location: [facility];  Service: General;  Laterality: N/A;   CYSTOSCOPY WITH RETROGRADE PYELOGRAM, URETEROSCOPY AND STENT PLACEMENT Right 06/10/2017   Procedure: CYSTOSCOPY WITH RIGHT URETEROSCOPY WITH RIGHT STENT PLACEMENT;  Surgeon: [name], MD;  Location: [facility];  Service: Urology;  Laterality: Right;   EUS N/A 04/16/2018   Procedure: FULL UPPER ENDOSCOPIC ULTRASOUND (EUS) RADIAL;  Surgeon: Burnette Fallow, MD;  [facility];  Service: Endoscopy;  Laterality: N/A;   FLEXIBLE SIGMOIDOSCOPY N/A 12/13/2017   Procedure: FLEXIBLE SIGMOIDOSCOPY;  Surgeon: Debby Hila, MD;  [facility];  Service: Endoscopy;  Laterality: N/A;   ILEO CONDUIT     IR CATHETER TUBE CHANGE  12/13/2017   IR CATHETER TUBE CHANGE  01/17/2018   IR CATHETER TUBE CHANGE  02/28/2018   IR CATHETER TUBE CHANGE  07/18/2018   IR CATHETER TUBE CHANGE  08/22/2018   IR CATHETER TUBE CHANGE  10/31/2018   IR CONVERT LEFT NEPHROSTOMY TO NEPHROURETERAL  CATH  10/24/2017   IR EXT NEPHROURETERAL CATH EXCHANGE  05/19/2018   IR EXT NEPHROURETERAL CATH EXCHANGE  06/16/2018   IR EXT NEPHROURETERAL CATH EXCHANGE  07/09/2024   IR NEPHRO TUBE REMOV/FL  10/24/2017   IR NEPHROSTOGRAM LEFT THRU EXISTING ACCESS  12/05/2018   IR NEPHROSTOMY EXCHANGE LEFT  05/11/2024   IR NEPHROSTOMY EXCHANGE LEFT  07/09/2024   IR NEPHROSTOMY PLACEMENT LEFT  10/07/2017   IR NEPHROSTOMY PLACEMENT RIGHT  05/04/2024   IR NEPHROSTOMY TUBE CHANGE  04/11/2018   IR URETERAL STENT PLACEMENT EXISTING ACCESS LEFT  05/13/2024   IR URETERAL STENT RIGHT NEW ACCESS W/SEP  NEPHROSTOMY CATH  05/13/2024   LAPAROTOMY N/A 04/20/2024   Procedure: EXPLORATORY LAPAROTOMY;  Surgeon: [name], MD;  [facility];  Service: General;  Laterality: N/A;   PARTIAL COLECTOMY N/A 04/21/2024   Procedure: COLECTOMY, PARTIAL;  Surgeon: [name], MD;  [facility];  Service: General;  Laterality: N/A;  Removal Wound Vac, Washout Ostomy, Possible Anastomosis, Possible Ileostomy.  Phasix Mesh.   POLYPECTOMY  06/29/2021   Procedure: POLYPECTOMY;  Surgeon: Debby Hila, MD;  [facility];  Service: Endoscopy;;   ROBOT ASSISTED LAPAROSCOPIC RADICAL PROSTATECTOMY  10/02/2012   Procedure: ROBOTIC ASSISTED LAPAROSCOPIC RADICAL PROSTATECTOMY LEVEL 2;  Surgeon: Noretta Ferrara, MD;  [facility];  Service: Urology;  Laterality: N/A;   ROBOTIC ASSISTED LAPAROSCOPIC BLADDER DIVERTICULECTOMY N/A 08/08/2017   Procedure: XI ROBOTIC ASSISTED LAPAROSCOPIC RADICAL CYSTECTOMY COVERTED TO OPEN PELVIC LYMPHADNECTOMY BILATERAL AND ILEAL CONDUIT URINARY DIVERSION;  Surgeon: Ferrara Glance, MD;  [facility];  Service: Urology;  Laterality: N/A;   TRANSURETHRAL RESECTION OF BLADDER TUMOR  06/10/2017   Procedure: TRANSURETHRAL RESECTION OF BLADDER TUMOR (TURBT);  Surgeon: Ferrara Glance, MD;  [facility];  Service: Urology;;   VACUUM ASSISTED CLOSURE CHANGE N/A 04/20/2024   Procedure: PLACEMENT OF CONCETTA CAPES;  Surgeon: [name], MD;  [facility];  Service: General;  Laterality: N/A;   VENTRAL HERNIA REPAIR N/A 04/10/2024   Procedure: REPAIR, HERNIA, VENTRAL;  Surgeon: [name], MD;  [facility];  Service: General;  Laterality: N/A;   XI ROBOT ABDOMINAL PERINEAL RESECTION N/A 08/08/2017   Procedure: REPAIR OF RECTAL TEAR POSSIBLE PARTIAL PROCTECTOMY, CREATION OF  OSTOMY;  Surgeon: Debby Hila, MD;  [facility];  Service: General;  Laterality: N/A;   XI ROBOTIC ASSISTED PARASTOMAL HERNIA REPAIR N/A 04/10/2024   Procedure: REPAIR, HERNIA, PARASTOMAL AND VENTRAL  HERNIAS, ROBOT-ASSISTED, LEFT INGUINAL HERNIA, LYSIS OF ADHESIONS INCARCERATED AND INSIONAL HERNIAS AND UROSTOMY REVISION;  Surgeon: [name], MD;  Location[facility];  Service: General;  Laterality: N/A;    Assessment & Plan Clinical Impression: . Alyus E. Heatwole is a 73 year old male with history of OSA, CHF, HFpEF, CAD, T2DM, PE- on eliquis , prostate CA, bladder cancer s/p cystoprostatectomy and ileal conduit 2013, recent admission 04/10/24 for parastomal-incisional abdominal wall hernia repair w/graft and ileal conduit reversal complicated by intra abdominal and abdominal wall abscess from bowel perforation s/p end ileostomy, acute on chronic renal failure, leak from distal end of conduit s/p bilateral nephrostomy tube placement with improvement in SCr, treated with 4 week course of Zosyn /fland prolonged stay requiring CIR 07/16-08/08/25. He was readmitted on 07/01/24 with fatigue, MS changes, tachycardia with hypotension,a acute on chronic renal failure with rise in SCr to 3.57 and leucocytosis with WBC 17. He was found to have purulent drainage from abdominal wall drain and started on broad spectrum antibiotics due to concerns of RLL  aspiration PNA and acute pyleonephritis ad CT abdomen pelvis revealed left hydronephrosis and hydroureter.  Nephrostomy tubes uncapped by urology. BC X 2 negative. UCS showed MRSA and E faecium and JP drain culture positive for E coli and Corynebacterium species. Dr. Macel consulted for input on renal failure with hyponatremia, metabolic acidosis and hypercalcemia felt to be due to  obstruction with poor intake and dehydration. He improved with LR for hydration and Dr. Shane following for input. Abdominal wall drain removed on 08/21 by Dr. Sheldon due to concerns of source of infection and imodium  and fiber added to thicken watery ileostomy effluent.    Dr. Overton consulted for input and recommends  6 weeks IV Vanc/Ceftriaxone  followed by oral suppression f to cover  Cornyebacterium and E coli if possible for 6 months total of antibiotics. As SCr improved and right nephrostomy tube clamped on 08/25. On 08/27, patient noted to be more confused and slight uptrend in SCr noted. He underwent exchange of bilateral PCN tube and replacement of right ureteral stent by Dr. Jenna. Right nephrostomy tube remains capped but large amount of urine noted in urostomy bag. Drainage from prior right drain site improving. Hypotension treated with IV hydrocortisone  with minimal response and BP remains soft. PT/OT consulted and working with patient who is limited by debility as ell as HR up to 130-150 with SPT tranfers. He requires mod assist  to max assist for pregait activity and able to stand for a few seconds. He was also noted to have cognitive deficits needing increased time to process, has decreased verbal output and able to follow simple one step command with verbal/gestural cues to perform ADLs.     Patient transferred to CIR on 07/11/2024 .    Patient currently requires modA to MaxA  with basic self-care skills secondary to muscle weakness.  Prior to hospitalization, patient could completed BADL'S related task with independent  LOF.SABRA  Patient will benefit from skilled intervention to increase independence with basic self-care skills prior to discharge home with care partner.  Anticipate patient will require 24 hour supervision and follow up home health.  OT - End of Session Activity Tolerance: Tolerates < 10 min activity with changes in vital signs Endurance Deficit: Yes Endurance Deficit Description: Challenges with maintain HR with change in position coming from supine OT Assessment Rehab Potential (ACUTE ONLY): Good OT Patient demonstrates impairments in the following area(s): Balance;Endurance;Other (Comment) (challenges with maintaining HR) OT Basic ADL's Functional Problem(s): Grooming;Bathing;Dressing;Toileting OT Transfers Functional Problem(s):  Toilet;Tub/Shower OT Plan OT Intensity: Minimum of 1-2 x/day, 45 to 90 minutes OT Frequency: 5 out of 7 days OT Duration/Estimated Length of Stay: 10-14 days OT Treatment/Interventions: Balance/vestibular training;Community reintegration;Patient/family education;Self Care/advanced ADL retraining;Therapeutic Exercise;UE/LE Coordination activities;UE/LE Strength taining/ROM;Therapeutic Activities;DME/adaptive equipment instruction;Cognitive remediation/compensation;Skin care/wound managment OT Self Feeding Anticipated Outcome(s): ModI OT Basic Self-Care Anticipated Outcome(s): ModI OT Recommendation Patient destination: Home Follow Up Recommendations: Home health OT Equipment Recommended: To be determined   OT Evaluation Precautions/Restrictions  Precautions Precautions: Fall (HOH, Urostomy, Colostomy, PCN  drain, and orthostatic hypotension.) Recall of Precautions/Restrictions: Impaired Precaution/Restrictions Comments: urostomy, colostomy, lt PCN drain, watch BP and HR Restrictions Weight Bearing Restrictions Per Provider Order: No General Chart Reviewed: Yes PT Missed Treatment Reason: Patient fatigue Response to Previous Treatment: Other (Comment) (Patient presents as very fatigued) Family/Caregiver Present: Yes (wife) Vital Signs Therapy Vitals Temp: 97.8 F (36.6 C) Pulse Rate: 80 Resp: 17 BP: 127/72 Patient Position (if appropriate): Lying Oxygen Therapy SpO2: 98 % O2 Device: Room Air Patient  Activity (if Appropriate): In bed Pain Pain Assessment Pain Scale: 0-10 Multiple Pain Sites: No Home Living/Prior Functioning Home Living Family/patient expects to be discharged to:: Private residence Living Arrangements: Spouse/significant other Available Help at Discharge: Family, Available 24 hours/day Type of Home: House Home Access: Stairs to enter (coming in from the garage 2 stairs with rails both side) Entrance Stairs-Number of Steps: 2 Entrance Stairs-Rails: Can  reach both Home Layout: Two level, Able to live on main level with bedroom/bathroom Alternate Level Stairs-Number of Steps: 14-16 stairs Alternate Level Stairs-Rails: Left (per chart) Bathroom Shower/Tub: Tub/shower unit Bathroom Toilet: Standard Bathroom Accessibility: Yes Additional Comments: taken per chart review and patient  Lives With: Spouse IADL History Homemaking Responsibilities: Yes (prior to most recent episode shared responsibilities) Meal Prep Responsibility: Secondary Laundry Responsibility: No Cleaning Responsibility: No Homemaking Comments: shared responsibilies prior to most recent episode Current License: Yes Mode of Transportation: Engineer, civil (consulting): Best boy Occupation: Retired Type of Occupation: Education officer, community Leisure and Hobbies: gardening, wood work, puzzles Prior Function Level of Independence: Independent with basic ADLs, Independent with homemaking with ambulation, Independent with gait, Independent with transfers  Able to Take Stairs?: Yes Driving: Yes (was driving prior to recent episode) Vocation: Retired Leisure: Hobbies-yes (Comment) Vision Baseline Vision/History: 1 Wears glasses Ability to See in Adequate Light: 0 Adequate Patient Visual Report: No change from baseline Vision Assessment?: No apparent visual deficits Perception    Praxis   Cognition Cognition Overall Cognitive Status: Impaired/Different from baseline Arousal/Alertness: Awake/alert Orientation Level: Place;Person;Situation Person: Oriented (That I was an OT) Place: Oriented (That he was in the hospital) Situation: Oriented (That he had some medical issues.) Memory: Impaired (word search) Memory Impairment: Decreased recall of new information;Retrieval deficit;Storage deficit Awareness: Appears intact Problem Solving: Appears intact Safety/Judgment: Appears intact Brief Interview for Mental Status (BIMS) Repetition of Three Words (First Attempt): 3 Temporal Orientation: Year:  Correct Temporal Orientation: Month: Accurate within 5 days Recall: Sock: Yes, no cue required Recall: Blue: Yes, no cue required Recall: Bed: Yes, no cue required BIMS Summary Score: 99 Sensation Sensation Light Touch: Appears Intact (with vision occluded) Hot/Cold: Appears Intact Proprioception: Appears Intact Additional Comments: light touch in tact in all dermatomes, pt denies any N&T Coordination Gross Motor Movements are Fluid and Coordinated: No Fine Motor Movements are Fluid and Coordinated: No Coordination and Movement Description: Deficits due to generalized weakness/debility and pain. Motor  Motor Motor: Other (comment) Motor - Skilled Clinical Observations: Deficits due to generalized weakness/debility and pain.  Trunk/Postural Assessment  Cervical Assessment Cervical Assessment: Exceptions to [facility] (forward head) Thoracic Assessment Thoracic Assessment: Exceptions to [facility] (rounded shlds) Lumbar Assessment Lumbar Assessment: Exceptions to [facility] (posterior pelvic tilt) Postural Control Postural Control: Deficits on evaluation Head Control: weak cervical extensors Righting Reactions: dealyed, inadequate Protective Responses: dealyed, inadequate  Balance Balance Balance Assessed:  (unable to maintain upright positon without changes with HR) Static Sitting Balance Static Sitting - Balance Support:  (UTA) Static Sitting - Level of Assistance:  (UTA) Dynamic Sitting Balance Dynamic Sitting - Balance Support: Feet supported Dynamic Sitting - Level of Assistance: Not tested (comment) (secondary to decrease HR with coming from supine to sit) Static Standing Balance Static Standing - Balance Support:  (UTA) Static Standing - Level of Assistance:  (UTA) Dynamic Standing Balance Dynamic Standing - Balance Support:  (UTA) Dynamic Standing - Level of Assistance: Not tested (comment) Dynamic Standing - Comments: UTA secondary to challenges maintaining HR coming from  supine to EOB Extremity/Trunk Assessment RUE Assessment RUE Assessment: Within Functional Limits  Passive Range of Motion (PROM) Comments: WFL Active Range of Motion (AROM) Comments: WFL General Strength Comments: 3/5 MMT LUE Assessment LUE Assessment: Within Functional Limits Passive Range of Motion (PROM) Comments: WFL Active Range of Motion (AROM) Comments: WFL General Strength Comments: 3/5  Care Tool Care Tool Self Care Eating   Eating Assist Level: Set up assist    Oral Care    Oral Care Assist Level: Set up assist    Bathing   Body parts bathed by patient: Chest;Face;Abdomen;Right arm;Left arm Body parts bathed by helper: Left upper leg;Right lower leg;Right upper leg;Buttocks;Front perineal area;Left lower leg   Assist Level: Moderate Assistance - Patient 50 - 74% (Mod/MaxA)    Upper Body Dressing(including orthotics)   What is the patient wearing?: Pull over shirt   Assist Level: Maximal Assistance - Patient 25 - 49%    Lower Body Dressing (excluding footwear)   What is the patient wearing?: Pants;Incontinence brief Assist for lower body dressing: Maximal Assistance - Patient 25 - 49% (MaxA /Dep)    Putting on/Taking off footwear   What is the patient wearing?: Non-skid slipper socks Assist for footwear: Dependent - Patient 0%       Care Tool Toileting Toileting activity   Assist for toileting:  (patient has colosteomy and urostomy)     Care Tool Bed Mobility Roll left and right activity   Roll left and right assist level: Maximal Assistance - Patient 25 - 49%    Sit to lying activity   Sit to lying assist level: Maximal Assistance - Patient 25 - 49%    Lying to sitting on side of bed activity   Lying to sitting on side of bed assist level: the ability to move from lying on the back to sitting on the side of the bed with no back support.: Maximal Assistance - Patient 25 - 49%     Care Tool Transfers Sit to stand transfer Sit to stand activity did not  occur: Safety/medical concerns      Chair/bed transfer Chair/bed transfer activity did not occur: Safety/medical concerns       Toilet transfer Toilet transfer activity did not occur: N/A       Care Tool Cognition  Expression of Ideas and Wants Expression of Ideas and Wants: 3. Some difficulty - exhibits some difficulty with expressing needs and ideas (e.g, some words or finishing thoughts) or speech is not clear  Understanding Verbal and Non-Verbal Content Understanding Verbal and Non-Verbal Content: 3. Usually understands - understands most conversations, but misses some part/intent of message. Requires cues at times to understand   Memory/Recall Ability Memory/Recall Ability : Current season;Staff names and faces;That he or she is in a hospital/hospital unit   Refer to Care Plan for Long Term Goals  SHORT TERM GOAL WEEK 1 OT Short Term Goal 1 (Week 1): The pt will come from supine in bed to EOB with MinA  using AE as needed. OT Short Term Goal 2 (Week 1): The pt will complete UB/LB bathing at [facility] incorporating AE as needed. OT Short Term Goal 3 (Week 1): The pt will dress UB/LB with ModA incorporating AE as needed. OT Short Term Goal 4 (Week 1): The pt will improve activity tolerance to good 3 of 5 occurences. OT Short Term Goal 5 (Week 1): The pt will safely improve core strength to 3+/5 MMT.  Recommendations for other services: None    Skilled Therapeutic Intervention  Patient seen this day for the Occupational Therapy evaluation  secondary to recent admit to inpatient rehabilitation to improve upon his functional Ind. Prior to seeing the pt , PT informed me that at the time of her evaluation the pt was met with a decrease in his HR secondary to changes in position,coming from supine in bed to EOB. The pt required returning to supine to avoid adverse response. Nursing also informed me of similar occurrences with the pt's chart information noting the pt's decrease HR as a concern as  well. The  pt presented with a resting BP of 127/72, 02 saturation of 98%, and a HR of 80. At the onset of the Occupational Therapy evaluation, the patient and the family expressed concerns regarding recent occurrences and  they were apprehensive regarding all  OOB activities .  I was able to gather all of the subjective information, however,  secondary to challenges with the pt's maintaining a normal HR secondary to  changes in position,  from supine to upright positioning further assessing is needed to identify challenges with functional transfers to all surfaces. I was able to identify areas associated with LB task performance secondary to observation of the patient during the execution of bed transfers from supine to  the L and R and his ability to incorporate BUE for  coordination and manipulation of his clothing, the covers, and grasping the bed rail.  I was also able to compare my observation with what was documented during chart review  The pt presents as  Mod to MaxA with UB bathing/dressing and MaxA to Dep with LB bathing/dressing.  The pt is very hopeful with a willingness to participate  to address his current deficits with focus on a more favorable outcome of ModI incorporating AE as needed.,   Based on the the pt willingness to participate and  prior performance during the therapeutic process, I am recommending 10-14 days of Occupational Therapy services.with focus on the current objectives.for gains in BADL/IADL related task performance in hopes of  reducing the burden of care for the care provider.  Modification to be made in the POC as needed.  ADL ADL Eating: Set up Where Assessed-Eating: Bed level Grooming: Minimal assistance;Setup Where Assessed-Grooming: Bed level Where Assessed-Upper Body Bathing: Bed level Lower Body Bathing: Moderate assistance Where Assessed-Lower Body Bathing: Bed level Upper Body Dressing: Maximal assistance Where Assessed-Upper Body Dressing: Bed level Lower  Body Dressing: Maximal assistance;Dependent (Patient unable to access out of bed secondary to challenges with HR with sit EOB) Where Assessed-Lower Body Dressing: Bed level (based on observation with transferring from supine to L and R) Toileting: Dependent (Challenges with coming from bed LOF secondary to decrease HR. Based on patient inability to transfer from supine to L and R.) Where Assessed-Toileting: Bed level Toilet Transfer: Not assessed (Medically not safe at this time.) Toilet Transfer Method: Not assessed Toilet Transfer Equipment: Other (comment) (medically not safe at this time.) Tub/Shower Transfer: Not assessed Tub/Shower Transfer Method: Unable to assess (change in posiition resulted in decrease HR) Tub/Shower Equipment: Other (comment) Walk-In Shower Transfer: Unable to assess Film/video editor Method: Unable to assess [employer]: Other (comment) (Medically not safe at this time secondry to decrease HR coming from supine to sitting EOB) ADL Comments: Patient requires additional assessing regarding funcitonal transfer to all surfaces secondry to not bed medically safe with change in position from supine to EOB. Mobility  Bed Mobility Bed Mobility: Rolling Right;Rolling Left Rolling Right: Maximal Assistance - Patient 25-49%;Dependent - Patient equal 0% Rolling Left: Maximal Assistance -  Patient 25-49%;Dependent - Patient equal 0% Right Sidelying to Sit: Dependent - Patient equal 0% Supine to Sit: Other (comment) (medically not safe at the time of this eval) Sit to Supine: Other (comment) (UTA) Scooting to [facility]: Dependent - Patient equal 0%;2 Helpers Transfers Sit to Stand:  (Medically not safe at the time of evaluation) Stand to Sit:  (UTA)   Discharge Criteria: Patient will be discharged from OT if patient refuses treatment 3 consecutive times without medical reason, if treatment goals not met, if there is a change in medical status, if patient makes  no progress towards goals or if patient is discharged from hospital.  The above assessment, treatment plan, treatment alternatives and goals were discussed and mutually agreed upon: by patient and by family  Elvera JONETTA Mace 07/12/2024, 6:32 PM

## 2024-07-12 NOTE — Evaluation (Signed)
 Speech Language Pathology Assessment and Plan  Patient Details  Name: Kristopher Thompson MRN: [id number] Date of Birth: 1951-05-19  SLP Diagnosis: Cognitive Impairments;Dysphagia  Rehab Potential: Excellent ELOS: 10-14 days   Today's Date: 07/12/2024 SLP Individual Time: 8897-8796 SLP Individual Time Calculation (min): 61 min   Hospital Problem: Principal Problem:   Debility  Past Medical History:  Past Medical History:  Diagnosis Date   At risk for sleep apnea    12-25-2017   STOP-BANG SCORE= 5   --- SENT TO PCP   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   moderate atypia - right low back   Atypical nevus 04/04/2007   moderate to marked - right upper back (wider shave)   Atypical nevus 04/04/2007   moderate atypia - center chest (wider shave)   Atypical nevus 04/04/2007   slight atypia - right thigh   Atypical nevus 11/29/2011   mild atypia - center upper back   Atypical nevus 11/29/2011   mild atypia - center chest   Bacteremia due to Klebsiella pneumoniae 10/09/2017   Bladder cancer (HCC) dx 07/2017   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 denies   Sleep apnea    cpap   Squamous cell carcinoma of skin 05/22/2013   left cheek - CX3 + 5FU   Wears glasses    Past Surgical History:  Past Surgical History:  Procedure Laterality Date   ABDOMINAL SURGERY     APPENDECTOMY  1972   BOWEL RESECTION N/A 04/20/2024   Procedure: SMALL BOWEL  RESECTION;  Surgeon: [name], MD;  Location: [facility];  Service: General;  Laterality: N/A;   CHOLECYSTECTOMY  1985   COLONOSCOPY N/A 06/29/2021   Procedure: COLONOSCOPY;  Surgeon: Debby Hila, MD;  Location: [facility];  Service: Endoscopy;  Laterality: N/A;   COLOSTOMY REVERSAL N/A 01/08/2018   Procedure: COLOSTOMY REVERSAL;  Surgeon: Debby Hila, MD;  Location: [facility];  Service: General;  Laterality: N/A;   CYSTOSCOPY WITH RETROGRADE PYELOGRAM, URETEROSCOPY AND STENT PLACEMENT Right 06/10/2017   Procedure: CYSTOSCOPY WITH RIGHT URETEROSCOPY WITH RIGHT STENT PLACEMENT;  Surgeon: [name], MD;  Location: [facility];  Service: Urology;  Laterality: Right;   EUS N/A 04/16/2018   Procedure: FULL UPPER ENDOSCOPIC ULTRASOUND (EUS) RADIAL;  Surgeon: Burnette Fallow, MD;  [facility];  Service: Endoscopy;  Laterality: N/A;   FLEXIBLE SIGMOIDOSCOPY N/A 12/13/2017   Procedure: FLEXIBLE SIGMOIDOSCOPY;  Surgeon: Debby Hila, MD;  [facility];  Service: Endoscopy;  Laterality: N/A;   ILEO CONDUIT     IR CATHETER TUBE CHANGE  12/13/2017   IR CATHETER TUBE CHANGE  01/17/2018   IR CATHETER TUBE CHANGE  02/28/2018   IR CATHETER TUBE CHANGE  07/18/2018   IR CATHETER TUBE CHANGE  08/22/2018   IR CATHETER TUBE CHANGE  10/31/2018   IR CONVERT LEFT NEPHROSTOMY TO NEPHROURETERAL CATH  10/24/2017   IR EXT NEPHROURETERAL CATH  EXCHANGE  05/19/2018   IR EXT NEPHROURETERAL CATH EXCHANGE  06/16/2018   IR EXT NEPHROURETERAL CATH EXCHANGE  07/09/2024   IR NEPHRO TUBE REMOV/FL  10/24/2017   IR NEPHROSTOGRAM LEFT THRU EXISTING ACCESS  12/05/2018   IR NEPHROSTOMY EXCHANGE LEFT  05/11/2024   IR NEPHROSTOMY EXCHANGE LEFT  07/09/2024   IR NEPHROSTOMY PLACEMENT LEFT  10/07/2017   IR NEPHROSTOMY PLACEMENT RIGHT  05/04/2024   IR NEPHROSTOMY TUBE CHANGE  04/11/2018   IR URETERAL STENT PLACEMENT EXISTING ACCESS LEFT  05/13/2024   IR URETERAL STENT RIGHT NEW ACCESS W/SEP NEPHROSTOMY CATH  05/13/2024   LAPAROTOMY  N/A 04/20/2024   Procedure: EXPLORATORY LAPAROTOMY;  Surgeon: [name], MD;  Location: [facility];  Service: General;  Laterality: N/A;   PARTIAL COLECTOMY N/A 04/21/2024   Procedure: COLECTOMY, PARTIAL;  Surgeon: [name], MD;  Location: [facility];  Service: General;  Laterality: N/A;  Removal Wound Vac, Washout Ostomy, Possible Anastomosis, Possible Ileostomy.  Phasix Mesh.   POLYPECTOMY  06/29/2021   Procedure: POLYPECTOMY;  Surgeon: Debby Hila, MD;  [facility];  Service: Endoscopy;;   ROBOT ASSISTED LAPAROSCOPIC RADICAL PROSTATECTOMY  10/02/2012   Procedure: ROBOTIC ASSISTED LAPAROSCOPIC RADICAL PROSTATECTOMY LEVEL 2;  Surgeon: Noretta Ferrara, MD;  Location: [facility];  Service: Urology;  Laterality: N/A;   ROBOTIC ASSISTED LAPAROSCOPIC BLADDER DIVERTICULECTOMY N/A 08/08/2017   Procedure: XI ROBOTIC ASSISTED LAPAROSCOPIC RADICAL CYSTECTOMY COVERTED TO OPEN PELVIC LYMPHADNECTOMY BILATERAL AND ILEAL CONDUIT URINARY DIVERSION;  Surgeon: Ferrara Glance, MD;  Location: [facility];  Service: Urology;  Laterality: N/A;   TRANSURETHRAL RESECTION OF BLADDER TUMOR  06/10/2017   Procedure: TRANSURETHRAL RESECTION OF BLADDER TUMOR (TURBT);  Surgeon: Ferrara Glance, MD;  Location: [facility];  Service: Urology;;   VACUUM ASSISTED CLOSURE CHANGE N/A 04/20/2024   Procedure: PLACEMENT OF CONCETTA CAPES;  Surgeon: [name], MD;  Location: [facility];  Service: General;  Laterality: N/A;   VENTRAL HERNIA REPAIR N/A 04/10/2024   Procedure: REPAIR, HERNIA, VENTRAL;  Surgeon: [name], MD;  [facility];  Service: General;  Laterality: N/A;   XI ROBOT ABDOMINAL PERINEAL RESECTION N/A 08/08/2017   Procedure: REPAIR OF RECTAL TEAR POSSIBLE PARTIAL PROCTECTOMY, CREATION OF  OSTOMY;  Surgeon: Debby Hila, MD;  [facility];  Service: General;  Laterality: N/A;   XI ROBOTIC ASSISTED PARASTOMAL HERNIA REPAIR N/A 04/10/2024   Procedure: REPAIR, HERNIA, PARASTOMAL AND VENTRAL HERNIAS, ROBOT-ASSISTED, LEFT INGUINAL  HERNIA, LYSIS OF ADHESIONS INCARCERATED AND INSIONAL HERNIAS AND UROSTOMY REVISION;  Surgeon: [name], MD;  Location: [facility];  Service: General;  Laterality: N/A;    Assessment / Plan / Recommendation Clinical Impression   Pt is a 73 y.o. male admitted to CIR following hospitalization for debility. PMHx significant for: h/o hernia s/p repair in May 2025, GERD, colostomy, sepsia, sleep apnea, DM II, renal disorder, PE, CHF, prostate cancer, bladder cancer.   Chart review shows: pt previously admitted back in late May for incarcerated incisional hernia and urostomy ileal conduit revision.  Postoperative course was complicated by bowel perforation, sepsis, ICU admission with intubation, requiring pressors and CRRT.    Acute rehab SLP assessed 6/7 with recommendations to initiate Dys 2 diet with thin liquids as well as aspiration and esophageal precautions. Returned to OR 6/9 for perforated bowel, post op shock, s/p exploratory lapartomy with drainage of R abdominal wall abscess on 6/9. CRRT 6/11-6/14, restarted 6/17-6/20. Extubated 6/14. Transferred to [facility] 6/15 for intermittent HD. He was stabilized and discharged to SNF on 7/11 but  returned at the request of SNF MD due to increased output of ostomy therefore he was directly admitted to [facility] on 05/25/24.   H&P notes pt has cognitive deficits needing increased time to process, has decreased verbal output and able to follow simple one step command with verbal/gestural cues to perform ADLs.   Swallowing: A bedside swallow evaluation was completed to assess oropharyngel function and risk for aspiration. Pt's wife, Adrien, present and providing hx as well. Pt's current diet is regular with thin; meds whole with thin. Baseline diet is regular with thin. Pt endorses occasional coughing with liquids (<50% of the time), globus sensation in pharynx, and daily regurgitation particularly with meat and medications. Denies odynophagia, hx  of PNA, or difficulty with mastication. Oral mech unremarkable; natural teeth present. No prior hx of MBSS per chart review and pt/wife recollection.  PO trials of ice chips, thin liquid water  via spoon/cup/straw, and regular texture crackers were conducted. No overt s/sx of aspiration were observed. Throat clear x1 following large consecutive sips. Mastication was [facility] and pt had no pocketing or residue.    Recommend Dys 3 with thin liquids given c/o esophageal issues with regular meats. Pt in agreement. SLP also recommended pills crushed in puree given pt reports of regurgitation but pt refused.   Pt would benefit from an MBSS to further assess swallowing given his extensive hx of esophageal issues, reports of globus sensation in his pharynx, and daily regurgitation of meats and pills. Pt initially stated that he was not interested in an MBSS; however, his wife was strongly in favor of it. SLP requested that pt and wife talk about it further and that ST would follow up; pt in agreement. Recommended GI f/u as well.   Cog/Ling:  Patient was seen for a speech/language evaluation assessing his voice, speech, resonance, expressive/receptive language, and cognitive communication skills. Pt and wife deny baseline concerns for memory/cognition in general. Hx of obstructive sleep apnea, recent hx of encephalopathy. Voice, speech, resonance, and language were WFL. The [employer] Mental Status (SLUMS) Examination was administered with the following results:  Task Score  Education Level Doctorate  Orientation 3/3  Numeric Calculations  0/3  Generative Naming with Time Constraint  2/3  Delayed Recall with Interference 3/5 after 5 minute delay  Of note, patient immediately recalled 4/5.  Digit Span 2/2  Clock Drawing 2/4  Following Commands/Visual Spatial  2/2  Auditory Comprehension Questions (after short story) 6/8  TOTAL 20/30   Based on the SLUMS and informal assessment, pt presents with  cognitive-linguistic deficits in executive functioning (planning, problem solving, initiation), verbal processing, verbal fluency, and STM recall. He would benefit from ST targeting cognitive goals to target these skills.    Skilled Therapeutic Interventions          A bedside swallow evaluation and speech/language/cognitive evaluation were conducted. See above.   SLP Assessment  Patient will need skilled Speech Language Pathology Services during CIR admission to address dysphagia and cognitive-linguistic skills.     Recommendations  Recommended Consults: Consider GI evaluation;Consider esophageal assessment SLP Diet Recommendations: Dysphagia 3 (Mech soft);Thin Medication Administration: Whole meds with liquid Supervision: Patient able to self feed Compensations: Slow rate;Small sips/bites;Follow solids with liquid Postural Changes and/or Swallow Maneuvers: Seated upright 90 degrees;Upright 30-60 min after meal Oral Care Recommendations: Oral care BID Patient destination: Home Follow up Recommendations: None Equipment Recommended: None recommended by SLP    SLP Frequency 3 to 5 out of 7 days  SLP Duration  SLP Intensity  SLP Treatment/Interventions 10-14 days  Minumum of 1-2 x/day, 30 to 90 minutes  Cognitive remediation/compensation;Functional tasks;Internal/external aids;Patient/family education;Medication managment;Dysphagia/aspiration precaution training;Therapeutic Activities;Therapeutic Exercise    Pain Pain Assessment Pain Scale: 0-10 Pain Score: 0-No pain Faces Pain Scale: No hurt Pain Location: Back Pain Intervention(s): Medication (See eMAR)  Prior Functioning Cognitive/Linguistic Baseline: Within functional limits Type of Home: House  Lives With: Spouse Available Help at Discharge: Family;Available 24 hours/day Education: Doctorate Vocation: Retired  Architectural technologist Overall Cognitive Status: Impaired/Different from baseline Arousal/Alertness:  Awake/alert Orientation Level: Oriented X4 Year: 2025 Month: August Day of Week: Correct Memory: Impaired Memory Impairment: Decreased recall of new information;Retrieval deficit;Storage deficit Awareness: Appears intact Problem Solving: Appears intact Safety/Judgment: Appears intact  Comprehension Auditory Comprehension Overall Auditory Comprehension: Appears within functional limits for tasks assessed Yes/No Questions: Within Functional Limits Commands: Within Functional Limits Interfering Components: Hearing;Processing speed EffectiveTechniques: Extra processing time;Increased volume;Repetition Visual Recognition/Discrimination Discrimination: Within Function Limits Reading Comprehension Reading Status: Not tested Expression Expression Primary Mode of Expression: Verbal Verbal Expression Overall Verbal Expression: Impaired Initiation: No impairment Repetition: No impairment Naming: No impairment Written Expression Dominant Hand: Right Written Expression: Exceptions to [facility] Interfering Components: Micrographia;Legibility Oral Motor Oral Motor/Sensory Function Overall Oral Motor/Sensory Function: Within functional limits Motor Speech Overall Motor Speech: Appears within functional limits for tasks assessed Respiration: Within functional limits Phonation: Normal Resonance: Within functional limits Articulation: Within functional limitis Intelligibility: Intelligible Motor Planning: Within functional limits Motor Speech Errors: Not applicable  Care Tool Care Tool Cognition Ability to hear (with hearing aid or hearing appliances if normally used Ability to hear (with hearing aid or hearing appliances if normally used): 2. Moderate difficulty - speaker has to increase volume and speak distinctly   Expression of Ideas and Wants Expression of Ideas and Wants: 3. Some difficulty - exhibits some difficulty with expressing needs and ideas (e.g, some words or finishing  thoughts) or speech is not clear   Understanding Verbal and Non-Verbal Content Understanding Verbal and Non-Verbal Content: 3. Usually understands - understands most conversations, but misses some part/intent of message. Requires cues at times to understand  Memory/Recall Ability Memory/Recall Ability : Current season;Staff names and faces;That he or she is in a hospital/hospital unit   Bedside Swallowing Assessment General Diet Prior to this Study: Regular Temperature Spikes Noted: No Respiratory Status: Room air History of Recent Intubation: No Behavior/Cognition: Alert;Cooperative Oral Cavity - Dentition: Adequate natural dentition Self-Feeding Abilities: Able to feed self Patient Positioning: Upright in bed Baseline Vocal Quality: Normal Volitional Cough: Strong Volitional Swallow: Able to elicit  Oral Care Assessment Oral Assessment  (WDL): Within Defined Limits Lips: Symmetrical Teeth: Intact Tongue: Pink Mucous Membrane(s): Pink Saliva: Moist, saliva free flowing Level of Consciousness: Alert Is patient on any of following O2 devices?: None of the above Nutritional status: Dysphagia Oral Assessment Risk : High Risk Ice Chips Ice chips: Within functional limits Thin Liquid Thin Liquid: Within functional limits Presentation: Cup;Spoon;Straw;Self Fed Nectar Thick Nectar Thick Liquid: Not tested Honey Thick Honey Thick Liquid: Not tested Puree Puree: Within functional limits Solid Solid: Within functional limits Presentation: Self Fed BSE Assessment Suspected Esophageal Findings Suspected Esophageal Findings: Globus sensation;Regurgitation Risk for Aspiration Impact on safety and function: Risk for inadequate nutrition/hydration Other Related Risk Factors: History of dysphagia;History of GERD;History of esophageal-related issues;Cognitive impairment;Deconditioning  Short Term Goals: Week 1: SLP Short Term Goal 1 (Week 1): Patient will tolerate Dys 3 diet textures  with reduced reguritation episodes using safe swallow  strategies. SLP Short Term Goal 2 (Week 1): Patient will recall safe swallow strategies (upright for PO, alternate solids/liquids, slow rate, small bites/sips) with 80% acc when provided min A. SLP Short Term Goal 3 (Week 1): Patient will use compensatory memory strategies to recall functional information in the current environment when provided mod A. SLP Short Term Goal 4 (Week 1): Patient will improve problem solving and planning skills in functional tasks when provided mod A.  Refer to Care Plan for Long Term Goals  Recommendations for other services: Other: GI Follow-up given pt's daily regurgitation   Discharge Criteria: Patient will be discharged from SLP if patient refuses treatment 3 consecutive times without medical reason, if treatment goals not met, if there is a change in medical status, if patient makes no progress towards goals or if patient is discharged from hospital.  The above assessment, treatment plan, treatment alternatives and goals were discussed and mutually agreed upon: by patient and by family  Waddell JONETTA Novak, MA CCC-SLP 07/12/2024, 12:21 PM

## 2024-07-12 NOTE — Plan of Care (Signed)
  Problem: RH Swallowing Goal: LTG Patient will consume least restrictive diet using compensatory strategies with assistance (SLP) Description: LTG:  Patient will consume least restrictive diet using compensatory strategies with assistance (SLP) Flowsheets (Taken 07/12/2024 1549) LTG: Pt Patient will consume least restrictive diet using compensatory strategies with assistance of (SLP): Minimal Assistance - Patient > 75%   Problem: RH Expression Communication Goal: LTG Patient will verbally express basic/complex needs(SLP) Description: LTG:  Patient will verbally express basic/complex needs, wants or ideas with cues  (SLP) Flowsheets (Taken 07/12/2024 1549) LTG: Patient will verbally express basic/complex needs, wants or ideas (SLP): Moderate Assistance - Patient 50 - 74%   Problem: RH Problem Solving Goal: LTG Patient will demonstrate problem solving for (SLP) Description: LTG:  Patient will demonstrate problem solving for basic/complex daily situations with cues  (SLP) Flowsheets (Taken 07/12/2024 1549) LTG: Patient will demonstrate problem solving for (SLP):  Basic daily situations  Complex daily situations LTG Patient will demonstrate problem solving for: Moderate Assistance - Patient 50 - 74%   Problem: RH Memory Goal: LTG Patient will demonstrate ability for day to day (SLP) Description: LTG:   Patient will demonstrate ability for day to day recall/carryover during cognitive/linguistic activities with assist  (SLP) Flowsheets (Taken 07/12/2024 1549) LTG: Patient will demonstrate ability for day to day recall:  New information  Daily complex information LTG: Patient will demonstrate ability for day to day recall/carryover during cognitive/linguistic activities with assist (SLP): Modified Independent

## 2024-07-12 NOTE — H&P (Signed)
 Physical Medicine and Rehabilitation Admission H&P        Chief Complaint  Patient presents with   Functional deficits due to debility.      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 He was readmitted on 07/01/24 with fatigue, MS changes, tachycardia with hypotension,a acute on chronic renal failure with rise in SCr to 3.57 and leucocytosis with WBC 17. He was found to have purulent drainage from abdominal wall drain and started on broad spectrum antibiotics due to concerns of RLL aspiration PNA and acute pyleonephritis ad CT abdomen pelvis revealed left hydronephrosis and hydroureter.  Nephrostomy tubes uncapped by urology. BC X 2 negative. UCS showed MRSA and E faecium and JP drain culture positive for E coli and Corynebacterium species. Dr. Macel consulted for input on renal failure with hyponatremia, metabolic acidosis and hypercalcemia felt to be due to  obstruction with poor intake and dehydration. He improved with LR for hydration and Dr. Shane following for input. Abdominal wall drain removed on 08/21 by Dr. Sheldon due to concerns of source of infection and imodium  and fiber added to thicken watery ileostomy effluent.    Dr. Overton consulted for input and recommends  6 weeks IV Vanc/Ceftriaxone  followed by oral suppression f to cover Cornyebacterium and E coli if possible for 6 months total of antibiotics. As SCr improved and right  nephrostomy tube clamped on 08/25. On 08/27, patient noted to be more confused and slight uptrend in SCr noted. He underwent exchange of bilateral PCN tube and replacement of right ureteral stent by Dr. Jenna. Right nephrostomy tube remains capped but large amount of urine noted in urostomy bag. Drainage from prior right drain site improving. Hypotension treated with IV hydrocortisone  with minimal response and BP remains soft. PT/OT consulted and working with patient who is limited by debility as ell as HR up to 130-150 with SPT tranfers. He requires mod assist  to max assist for pregait activity and able to stand for a few seconds. He was also noted to have cognitive deficits needing increased time to process, has decreased verbal output and able to follow simple one step command with verbal/gestural cues to perform ADLs.         ROS         Past Medical History:  Diagnosis Date   At risk for sleep apnea      12-25-2017   STOP-BANG SCORE= 5   --- SENT TO PCP   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    moderate atypia - right low back   Atypical nevus 04/04/2007    moderate to marked - right upper back (wider shave)   Atypical nevus 04/04/2007    moderate atypia - center chest (wider shave)   Atypical nevus 04/04/2007    slight atypia - right thigh   Atypical nevus 11/29/2011    mild atypia - center upper back   Atypical nevus 11/29/2011    mild atypia - center chest   Bacteremia due to Klebsiella pneumoniae 10/09/2017  Bladder cancer (HCC) dx 07/2017    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  s/p  cystectomy w/ ileal conduit urinary diversion   Candida infection     CHF (congestive heart failure) (HCC)     Colostomy in place [facility])      since 08-08-2017-- per pt 12-25-2017 reddness around stoma   Diabetes mellitus without complication (HCC)     GERD (gastroesophageal reflux disease)     H/O hiatal hernia     History of sepsis 09/2017    dx bacteremia due to klebsiella pneumoniae,  post op  intraabdominal abscess   Prostate cancer [facility]) urologist-- dr renda    10-02-2012 s/p  prostatectomy-- Stage T1c   RBBB     Renal disorder      pt. denies   Sleep apnea      cpap   Squamous cell carcinoma of skin 05/22/2013    left cheek - CX3 + 5FU   Wears glasses                 Past Surgical History:  Procedure Laterality Date   ABDOMINAL SURGERY       APPENDECTOMY   1972   BOWEL RESECTION N/A 04/20/2024    Procedure: SMALL BOWEL RESECTION;  Surgeon: [name], MD;  Location: [facility];  Service: General;  Laterality: N/A;   CHOLECYSTECTOMY   1985   COLONOSCOPY N/A 06/29/2021    Procedure: COLONOSCOPY;  Surgeon: Debby Hila, MD;  [facility];  Service: Endoscopy;  Laterality: N/A;   COLOSTOMY REVERSAL N/A 01/08/2018    Procedure: COLOSTOMY REVERSAL;  Surgeon: Debby Hila, MD;  Location: [facility];  Service: General;  Laterality: N/A;   CYSTOSCOPY WITH RETROGRADE PYELOGRAM, URETEROSCOPY AND STENT PLACEMENT Right 06/10/2017    Procedure: CYSTOSCOPY WITH RIGHT URETEROSCOPY WITH RIGHT STENT PLACEMENT;  Surgeon: [name], MD;  Location: [facility];  Service: Urology;  Laterality: Right;   EUS N/A 04/16/2018    Procedure: FULL UPPER ENDOSCOPIC ULTRASOUND (EUS) RADIAL;  Surgeon: Burnette Fallow, MD;  [facility];  Service: Endoscopy;  Laterality: N/A;   FLEXIBLE SIGMOIDOSCOPY N/A 12/13/2017    Procedure: FLEXIBLE SIGMOIDOSCOPY;  Surgeon: Debby Hila, MD;  [facility];  Service: Endoscopy;  Laterality: N/A;   ILEO CONDUIT       IR CATHETER TUBE CHANGE   12/13/2017   IR CATHETER TUBE CHANGE   01/17/2018   IR CATHETER TUBE CHANGE   02/28/2018   IR CATHETER TUBE CHANGE   07/18/2018   IR CATHETER TUBE CHANGE   08/22/2018   IR CATHETER TUBE CHANGE   10/31/2018   IR CONVERT LEFT NEPHROSTOMY TO NEPHROURETERAL CATH   10/24/2017   IR EXT NEPHROURETERAL CATH EXCHANGE   05/19/2018   IR EXT NEPHROURETERAL CATH EXCHANGE   06/16/2018   IR EXT NEPHROURETERAL CATH EXCHANGE    07/09/2024   IR NEPHRO TUBE REMOV/FL   10/24/2017   IR NEPHROSTOGRAM LEFT THRU EXISTING ACCESS   12/05/2018   IR NEPHROSTOMY EXCHANGE LEFT   05/11/2024   IR NEPHROSTOMY EXCHANGE LEFT   07/09/2024   IR NEPHROSTOMY PLACEMENT LEFT   10/07/2017   IR NEPHROSTOMY PLACEMENT RIGHT   05/04/2024   IR NEPHROSTOMY TUBE CHANGE   04/11/2018   IR URETERAL STENT PLACEMENT EXISTING ACCESS LEFT   05/13/2024   IR URETERAL STENT RIGHT NEW ACCESS W/SEP NEPHROSTOMY CATH   05/13/2024   LAPAROTOMY N/A 04/20/2024    Procedure: EXPLORATORY LAPAROTOMY;  Surgeon: [name], MD;  [facility];  Service: General;  Laterality: N/A;   PARTIAL COLECTOMY N/A 04/21/2024    Procedure: COLECTOMY, PARTIAL;  Surgeon: [name], MD;  [facility];  Service: General;  Laterality: N/A;  Removal Wound Vac, Washout Ostomy, Possible Anastomosis, Possible Ileostomy.  Phasix Mesh.   POLYPECTOMY   06/29/2021    Procedure: POLYPECTOMY;  Surgeon: Debby Hila, MD;  [facility];  Service: Endoscopy;;   ROBOT ASSISTED LAPAROSCOPIC RADICAL PROSTATECTOMY   10/02/2012    Procedure: ROBOTIC ASSISTED LAPAROSCOPIC RADICAL PROSTATECTOMY LEVEL 2;  Surgeon: Noretta Ferrara, MD;  [facility];  Service: Urology;  Laterality: N/A;   ROBOTIC ASSISTED LAPAROSCOPIC BLADDER DIVERTICULECTOMY N/A 08/08/2017    Procedure: XI ROBOTIC ASSISTED LAPAROSCOPIC RADICAL CYSTECTOMY COVERTED TO OPEN PELVIC LYMPHADNECTOMY BILATERAL AND ILEAL CONDUIT URINARY DIVERSION;  Surgeon: Ferrara Glance, MD;  [facility];  Service: Urology;  Laterality: N/A;   TRANSURETHRAL RESECTION OF BLADDER TUMOR   06/10/2017    Procedure: TRANSURETHRAL RESECTION OF BLADDER TUMOR (TURBT);  Surgeon: Ferrara Glance, MD;  [facility];  Service: Urology;;   VACUUM ASSISTED CLOSURE CHANGE N/A 04/20/2024    Procedure: PLACEMENT OF CONCETTA CAPES;  Surgeon: [name], MD;  [facility];  Service: General;  Laterality: N/A;   VENTRAL HERNIA REPAIR N/A 04/10/2024    Procedure:  REPAIR, HERNIA, VENTRAL;  Surgeon: [name], MD;  [facility];  Service: General;  Laterality: N/A;   XI ROBOT ABDOMINAL PERINEAL RESECTION N/A 08/08/2017    Procedure: REPAIR OF RECTAL TEAR POSSIBLE PARTIAL PROCTECTOMY, CREATION OF  OSTOMY;  Surgeon: Debby Hila, MD;  [facility];  Service: General;  Laterality: N/A;   XI ROBOTIC ASSISTED PARASTOMAL HERNIA REPAIR N/A 04/10/2024    Procedure: REPAIR, HERNIA, PARASTOMAL AND VENTRAL HERNIAS, ROBOT-ASSISTED, LEFT INGUINAL HERNIA, LYSIS OF ADHESIONS INCARCERATED AND INSIONAL HERNIAS AND UROSTOMY REVISION;  Surgeon: [name], MD;  Location[facility];  Service: General;  Laterality: N/A;               Family History  Problem Relation Age of Onset   Lung cancer Mother     Hypertension Father     Colon cancer Other     CAD Neg Hx     Diabetes Neg Hx     Stroke Neg Hx            Social History:  reports that he quit smoking about 44 years ago. His smoking use included cigarettes. He started smoking about 53 years ago. He has never used smokeless tobacco. He reports current alcohol  use. He reports that he does not use drugs.     Allergies      Allergies  Allergen Reactions   Demerol  [Meperidine ] Nausea And Vomiting              Medications Prior to Admission  Medication Sig Dispense Refill   acetaminophen  (TYLENOL ) 325 MG tablet Take 1-2 tablets (325-650 mg total) by mouth every 4 (four) hours as needed for mild pain (pain score 1-3).       alum & mag hydroxide-simeth (MAALOX PLUS) 400-400-40 MG/5ML suspension Take 15 mLs by mouth every 4 (four) hours as needed for indigestion. 355 mL 0   cyclobenzaprine  (FLEXERIL ) 5 MG tablet Take 1 tablet (5 mg total) by mouth 3 (three) times daily as needed for muscle spasms. 20 tablet 1   cyproheptadine  (PERIACTIN ) 4 MG tablet Take 1 tablet (4 mg total) by mouth at bedtime. 30 tablet 0   ELIQUIS  5 MG TABS tablet  Take 5 mg by mouth 2 (two) times daily.       feeding supplement  (ENSURE PLUS HIGH PROTEIN) LIQD Take 237 mLs by mouth 2 (two) times daily between meals. (Patient taking differently: Take 237 mLs by mouth daily.)       hydrOXYzine  (ATARAX ) 25 MG tablet Take 1 tablet (25 mg total) by mouth 3 (three) times daily as needed for anxiety.       leptospermum manuka honey (MEDIHONEY) PSTE paste Apply 1 Application topically daily. 44 mL 0   loperamide  (IMODIUM ) 2 MG capsule Take 2 capsules (4 mg total) by mouth 2 (two) times daily. 30 capsule 0   melatonin 3 MG TABS tablet Take 1 tablet (3 mg total) by mouth at bedtime.       Metamucil Fiber 2 g CHEW Chew 1 Dose by mouth 2 (two) times daily.       metoCLOPramide  (REGLAN ) 5 MG tablet Take 1 tablet (5 mg total) by mouth 3 (three) times daily before meals. 90 tablet 0   midodrine  (PROAMATINE ) 5 MG tablet Take 3 tablets (15 mg total) by mouth 3 (three) times daily with meals. (Patient taking differently: Take 15 mg by mouth 2 (two) times daily with a meal.) 270 tablet 0   nutrition supplement, JUVEN, (JUVEN) PACK Take 1 packet by mouth 2 (two) times daily between meals. (Patient taking differently: Take 1 packet by mouth daily.)       oxyCODONE -acetaminophen  (PERCOCET) 5-325 MG tablet Take 2 tablets by mouth every 6 (six) hours as needed for severe pain (pain score 7-10). 21 tablet 0   pantoprazole  (PROTONIX ) 40 MG tablet Take 40 mg by mouth daily.       prochlorperazine  (COMPAZINE ) 5 MG tablet Take 1 tablet (5 mg total) by mouth every 6 (six) hours as needed for nausea. 30 tablet 0   sodium chloride  (OCEAN) 0.65 % SOLN nasal spray Place 1-2 sprays into both nostrils every 6 (six) hours as needed for congestion (dry nose).       vitamin B-12 (CYANOCOBALAMIN ) 500 MCG tablet Take 500 mcg by mouth daily.       ferrous sulfate  325 (65 FE) MG tablet Take 1 tablet (325 mg total) by mouth 2 (two) times daily with a meal. (Patient not taking: Reported on 07/02/2024) 60 tablet 0   fludrocortisone  (FLORINEF ) 0.1 MG tablet Take 2 tablets  (0.2 mg total) by mouth daily. (Patient not taking: Reported on 07/02/2024) 60 tablet 0   ipratropium (ATROVENT ) 0.03 % nasal spray Place 1 spray into both nostrils every 8 (eight) hours as needed (allergies). (Patient not taking: Reported on 07/02/2024)       metFORMIN (GLUCOPHAGE) 500 MG tablet Take 500 mg by mouth daily. (Patient not taking: Reported on 07/02/2024)       Multiple Vitamin (MULTIVITAMIN PO) Take 1 tablet by mouth daily. (Patient not taking: Reported on 07/02/2024)       Nystatin (GERHARDT'S BUTT CREAM) CREA Apply 1 Application topically 2 (two) times daily. (Patient not taking: Reported on 07/02/2024) 60 each 0   Omega-3 Fatty Acids (FISH OIL) 1000 MG CAPS Take 1,000 mg by mouth every Monday, Wednesday, and Friday. (Patient not taking: Reported on 07/02/2024)       polycarbophil (FIBERCON) 625 MG tablet Take 1 tablet (625 mg total) by mouth 2 (two) times daily. (Patient not taking: Reported on 07/02/2024) 60 tablet 2   promethazine  (PHENERGAN ) 25 MG tablet Take 25 mg by mouth every 8 (eight) hours as needed. (  Patient not taking: Reported on 07/02/2024)       psyllium (HYDROCIL/METAMUCIL) 95 % PACK Take 1 packet by mouth daily. (Patient not taking: Reported on 07/02/2024)       rosuvastatin  (CRESTOR ) 10 MG tablet Take 1 tablet (10 mg total) by mouth every evening. (Patient not taking: Reported on 07/02/2024) 30 tablet 0   sertraline  (ZOLOFT ) 100 MG tablet Take 1 tablet (100 mg total) by mouth daily. (Patient not taking: Reported on 07/02/2024) 30 tablet 0              Home: Home Living Family/patient expects to be discharged to:: Private residence Living Arrangements: Spouse/significant other Available Help at Discharge: Family, Available 24 hours/day Type of Home: House Home Access: Stairs to enter [employer] of Steps: 2 Entrance Stairs-Rails: Can reach both Home Layout: Two level, Able to live on main level with bedroom/bathroom Bathroom Shower/Tub: Teacher, music: Standard Bathroom Accessibility: Yes Home Equipment: Rollator (4 wheels), Rolling Walker (2 wheels)  Lives With: Spouse   Functional History: Prior Function Prior Level of Function : Independent/Modified Independent Mobility Comments: mod i RW, got weak after a couple of days at home then needed help ADLs Comments: wife reports pt was bathing himself, had assist wtih dressing.   Functional Status:  Mobility: Bed Mobility Overal bed mobility: Needs Assistance Bed Mobility: Rolling, Sidelying to Sit Rolling: Max assist, Used rails Sidelying to sit: Mod assist, +2 for physical assistance Supine to sit: Mod assist, HOB elevated, Used rails Sit to supine: Mod assist, Used rails General bed mobility comments: max assist to roll right this session after ted hose donned with mod assist to clear legs off bed and elevate trunk to sitting. mod assist to scoot to EOB Transfers Overall transfer level: Needs assistance Equipment used: Rolling walker (2 wheels) Transfers: Sit to/from Stand Sit to Stand: Mod assist, +2 physical assistance Bed to/from chair/wheelchair/BSC transfer type:: Stand pivot Stand pivot transfers: Mod assist, +2 safety/equipment Step pivot transfers: Mod assist, +2 physical assistance Transfer via Lift Equipment: Stedy General transfer comment: mod +2 assist to rise from bed with axillary assist and cues for anterior weight shift and hip extension. Pt able to step with shuffling feet bed to chair with use of RW and mod assist +2 for safety. repeated stand from chair with mod assist to scoot to edge and min +2 to stand from surface, unable to march in standing and stood grossly 15 sec Ambulation/Gait Ambulation/Gait assistance: Mod assist Gait Distance (Feet): 2 Feet Assistive device: Rolling walker (2 wheels) Gait Pattern/deviations: Step-to pattern General Gait Details: not yet able   ADL: ADL Overall ADL's : Needs  assistance/impaired Eating/Feeding: Independent, Sitting Grooming: Sitting, Contact guard assist, Wash/dry face Upper Body Bathing: Sitting, Contact guard assist Lower Body Bathing: Moderate assistance, Sitting/lateral leans Upper Body Dressing : Sitting, Minimal assistance Lower Body Dressing: Total assistance, Bed level Lower Body Dressing Details (indicate cue type and reason): don socks, TED hoses, wrapped BLEs. Toilet Transfer: Rolling walker (2 wheels), Stand-pivot, Maximal assistance Toilet Transfer Details (indicate cue type and reason): simulated with recliner t.f Toileting- Clothing Manipulation and Hygiene: Maximal assistance, +2 for physical assistance, +2 for safety/equipment, Sit to/from stand Functional mobility during ADLs: Maximal assistance (rec +2 for safety) General ADL Comments: not able to progress to standing ADLs using stedy. HR too high once in perched position.   Cognition: Cognition Orientation Level: Oriented X4 Cognition Arousal: Alert Behavior During Therapy: Flat affect   Physical Exam: Blood pressure 128/78,  pulse 94, temperature 98.9 F (37.2 C), temperature source Oral, resp. rate 18, height 5' 11 (1.803 m), weight 88.4 kg, SpO2 97%. Physical Exam   General: No acute distress Mood and affect are appropriate Heart: Regular rate and rhythm no rubs murmurs or extra sounds Lungs: Clear to auscultation, breathing unlabored, no rales or wheezes Abdomen: Positive bowel sounds, soft nontender to palpation, nondistended Extremities: No clubbing, cyanosis, or edema Skin: No evidence of breakdown, no evidence of rash Neurologic: Cranial nerves II through XII intact, motor strength is 4/5 in bilateral deltoid, bicep, tricep, grip, 2-hip flexor, 3-knee extensors, 4-ankle dorsiflexor and plantar flexor Sensory exam normal sensation to light touch  bilateral upper and lower extremities  Musculoskeletal: bilateral knee flexion contracture, ~20-30deg,  osteoarthitic changes Left knee . No joint swelling  Lab Results Last 48 Hours        Results for orders placed or performed during the hospital encounter of 07/01/24 (from the past 48 hours)  Vancomycin , peak     Status: Abnormal    Collection Time: 07/08/24  5:49 PM  Result Value Ref Range    Vancomycin  Pk >60 (HH) 30 - 40 ug/mL      Comment: CRITICAL RESULT CALLED TO, READ BACK BY AND VERIFIED WITH A ALAGO,RN 1956 07/08/2024 WBOND Performed at [facility], [address] [place], [place] [address]    Vancomycin , random     Status: Abnormal    Collection Time: 07/08/24  8:56 PM  Result Value Ref Range    Vancomycin  Rm 52 (HH) ug/mL      Comment: CRITICAL RESULT CALLED TO, READ BACK BY AND VERIFIED WITH L.LEGO 2206 07/08/2024 BY G.GANADEN        Random Vancomycin  therapeutic range is dependent on dosage and time of specimen collection. A peak range is 20-40 ug/mL A trough range is 5-15 ug/mL        Performed at [facility], [address] [place], [place] [address]    Comprehensive metabolic panel     Status: Abnormal    Collection Time: 07/09/24  2:52 AM  Result Value Ref Range    Sodium 138 135 - 145 mmol/L    Potassium 3.5 3.5 - 5.1 mmol/L    Chloride 112 (H) 98 - 111 mmol/L    CO2 18 (L) 22 - 32 mmol/L    Glucose, Bld 77 70 - 99 mg/dL      Comment: Glucose reference range applies only to samples taken after fasting for at least 8 hours.    BUN 34 (H) 8 - 23 mg/dL    Creatinine, Ser 8.51 (H) 0.61 - 1.24 mg/dL    Calcium  9.6 8.9 - 10.3 mg/dL    Total Protein 5.1 (L) 6.5 - 8.1 g/dL    Albumin  1.9 (L) 3.5 - 5.0 g/dL    AST 21 15 - 41 U/L    ALT 25 0 - 44 U/L    Alkaline Phosphatase 73 38 - 126 U/L    Total Bilirubin 0.5 0.0 - 1.2 mg/dL    GFR, Estimated 50 (L) >60 mL/min      Comment: (NOTE) Calculated using the CKD-EPI Creatinine Equation (2021)      Anion gap 8 5 - 15      Comment: Performed at [facility], [address], [place], [place]  [address]  Phosphorus     Status: None    Collection Time: 07/09/24  2:52 AM  Result Value Ref Range  Phosphorus 2.5 2.5 - 4.6 mg/dL      Comment: Performed at [facility], [address], [place], [place] [address]  Vancomycin , trough     Status: Abnormal    Collection Time: 07/09/24  3:57 PM  Result Value Ref Range    Vancomycin  Tr 39 (HH) 15 - 20 ug/mL      Comment: CRITICAL RESULT CALLED TO, READ BACK BY AND VERIFIED WITH JUDITHANN BULLOCKS RN @1722  ON 07/09/24 MAB Performed at [facility], [address], [place], [place] [address]         Imaging Results (Last 48 hours)  IR NEPHROSTOMY EXCHANGE BILATERAL Result Date: 07/09/2024 INDICATION: The patient has a complicated urologic history with current bilateral percutaneous nephrostomy tubes and retrograde right-sided ureteral stent. Plan is to exchange 3 catheters and at the end of the procedure capping the right percutaneous nephrostomy tube. EXAM: Bilateral percutaneous nephrostomy tube exchange and right-sided retrograde ureteral stent exchange COMPARISON:  None Available. ANESTHESIA/SEDATION: Moderate (conscious) sedation was employed during this procedure. A total of Versed  see epic record mg and Fentanyl  see epic record mcg was administered intravenously by the radiology nurse. Total intra-service moderate Sedation Time: See epic record minutes. The patient's level of consciousness and vital signs were monitored continuously by radiology nursing throughout the procedure under my direct supervision. CONTRAST:  See epic record-administered into the collecting system(s) FLUOROSCOPY: Radiation Exposure Index (as provided by the fluoroscopic device): 25.5 mGy Kerma COMPLICATIONS: None immediate. PROCEDURE: Informed written consent was obtained from the patient after a thorough discussion of the procedural risks, benefits and alternatives. All questions were addressed. Maximal Sterile Barrier Technique was utilized including caps, mask,  sterile gowns, sterile gloves, sterile drape, hand hygiene and skin antiseptic. A timeout was performed prior to the initiation of the procedure. A nearly prone position, the patient was prepped and draped in usual sterile fashion. Initially dilute contrast was injected in the retrograde right-sided ureteral stent in order to evaluate catheter position. Contrast can be seen in the dilated ureter and the catheter is in good position. The catheter was cut and a stiff Glidewire was advanced under fluoroscopic guidance into the renal pelvis. Catheter was then removed over the guidewire and a new 10 French cook pigtail catheter was advanced into the renal collecting system. Locking mechanism was engaged. Dilute contrast was then injected into the right percutaneous nephrostomy tube. Catheter is in good position in the renal pelvis on the right side. Catheter and retention suture were cut. A stiff Glidewire was advanced under fluoroscopic guidance. Catheter was. A new 10 French pigtail catheter was then advanced over the guidewire and coiled within renal pelvis. Locking mechanism was engaged. Retention suture was engaged and sterile dressing was applied. The right-sided percutaneous nephrostomy tube was then capped. Dilute contrast was then injected into the left percutaneous nephrostomy tube demonstrating the catheter to be coiled within the renal pelvis. The retention suture and catheter were then cut. A stiff Glidewire was then advanced under fluoroscopic guidance into the catheter and coiled within the renal pelvis. The catheter was completely removed from the patient. A new 10 French percutaneous nephrostomy tube was then advanced over the guidewire and coiled within the renal pelvis. Guidewire was removed. Retention suture and sterile dressing were applied. This catheter was connected to gravity drainage. IMPRESSION: Satisfactory exchange of bilateral percutaneous nephrostomy tubes as well as the retrograde  right-sided ureteral stent. The patient will return for routine exchanges in approximately 2 months. The right percutaneous  nephrostomy is capped and if trial is successful this catheter will likely be removed. I would recommend this catheter be removed under fluoroscopic guidance in order to ensure no malpositioning of the right-sided ureteral stent during the removal process. Electronically Signed   By: Cordella Banner   On: 07/09/2024 13:58    IR EXT NEPHROURETERAL CATH EXCHANGE Result Date: 07/09/2024 INDICATION: The patient has a complicated urologic history with current bilateral percutaneous nephrostomy tubes and retrograde right-sided ureteral stent. Plan is to exchange 3 catheters and at the end of the procedure capping the right percutaneous nephrostomy tube. EXAM: Bilateral percutaneous nephrostomy tube exchange and right-sided retrograde ureteral stent exchange COMPARISON:  None Available. ANESTHESIA/SEDATION: Moderate (conscious) sedation was employed during this procedure. A total of Versed  see epic record mg and Fentanyl  see epic record mcg was administered intravenously by the radiology nurse. Total intra-service moderate Sedation Time: See epic record minutes. The patient's level of consciousness and vital signs were monitored continuously by radiology nursing throughout the procedure under my direct supervision. CONTRAST:  See epic record-administered into the collecting system(s) FLUOROSCOPY: Radiation Exposure Index (as provided by the fluoroscopic device): 25.5 mGy Kerma COMPLICATIONS: None immediate. PROCEDURE: Informed written consent was obtained from the patient after a thorough discussion of the procedural risks, benefits and alternatives. All questions were addressed. Maximal Sterile Barrier Technique was utilized including caps, mask, sterile gowns, sterile gloves, sterile drape, hand hygiene and skin antiseptic. A timeout was performed prior to the initiation of the procedure. A  nearly prone position, the patient was prepped and draped in usual sterile fashion. Initially dilute contrast was injected in the retrograde right-sided ureteral stent in order to evaluate catheter position. Contrast can be seen in the dilated ureter and the catheter is in good position. The catheter was cut and a stiff Glidewire was advanced under fluoroscopic guidance into the renal pelvis. Catheter was then removed over the guidewire and a new 10 French cook pigtail catheter was advanced into the renal collecting system. Locking mechanism was engaged. Dilute contrast was then injected into the right percutaneous nephrostomy tube. Catheter is in good position in the renal pelvis on the right side. Catheter and retention suture were cut. A stiff Glidewire was advanced under fluoroscopic guidance. Catheter was. A new 10 French pigtail catheter was then advanced over the guidewire and coiled within renal pelvis. Locking mechanism was engaged. Retention suture was engaged and sterile dressing was applied. The right-sided percutaneous nephrostomy tube was then capped. Dilute contrast was then injected into the left percutaneous nephrostomy tube demonstrating the catheter to be coiled within the renal pelvis. The retention suture and catheter were then cut. A stiff Glidewire was then advanced under fluoroscopic guidance into the catheter and coiled within the renal pelvis. The catheter was completely removed from the patient. A new 10 French percutaneous nephrostomy tube was then advanced over the guidewire and coiled within the renal pelvis. Guidewire was removed. Retention suture and sterile dressing were applied. This catheter was connected to gravity drainage. IMPRESSION: Satisfactory exchange of bilateral percutaneous nephrostomy tubes as well as the retrograde right-sided ureteral stent. The patient will return for routine exchanges in approximately 2 months. The right percutaneous nephrostomy is capped and if  trial is successful this catheter will likely be removed. I would recommend this catheter be removed under fluoroscopic guidance in order to ensure no malpositioning of the right-sided ureteral stent during the removal process. Electronically Signed   By: Cordella Banner   On: 07/09/2024 13:58  Blood pressure 128/78, pulse 94, temperature 98.9 F (37.2 C), temperature source Oral, resp. rate 18, height 5' 11 (1.803 m), weight 88.4 kg, SpO2 97%.   Medical Problem List and Plan: 1. 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  H/o PE/Antithrombotics: -DVT/anticoagulation:  Pharmaceutical: Eliquis              -antiplatelet therapy: N/A 3. Pain Management: Oxycodone  prn.  4. Mood/Behavior/Sleep: LCSW to follow for evaluation and support.              --melatonin for insomnia.              -antipsychotic agents: N/A 5. Neuropsych/cognition: This patient is not capable of making decisions on his own behalf. 6. Skin/Wound Care: Routine pressure relief measures. Encourage adequate diet             --dressing changes daily. Gerhardt's cream to buttock wound.  7. Fluids/Electrolytes/Nutrition: Monitor I/O. Check CMET in am. Periactin  for appetite             --continue juven bid. Has been refusing Ensure supplement.  8. Sepsis/Abdominal wall/mesh infection: IV Vanc/Ceftriaxone  for 6 weeks from 08/21             --Probable 6 moths total of antibiotics--ID follow up 09/18 @10  am --Corynebacterium species-->6 more weeks Dalbavancin? V/s tedezolid/linezolid . Ecoli-->ceftriaxone  to Omadocycline pending susceptibility.  9. Left> right hydronephrosis: Nephrostomy tubes with stent exchanged on 08/27 --continue clamping right tube and left to drain per urology.  10. Acute on chronic renal failure: Improved but slowly trending up with SCr 1.48 --continue to monitor 11. OSA:  Encourage CPAP use 12. Hypotension: Continue Midodrine  15 mg TID and Florinef  2 mg daily             --continues to have significant tachycardia with activity likely due to orthostasis and deconditioning 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 Question need for reglan  14. Depression: On Zoloft  15. Malnutrition: Albumin  @1 .9/TP-5.1. Recheck Phos/Mg levels.  16. Anemia of chronic illness: Stable overall.   17.  Coughing with fluids will await SLP eval    Sharlet GORMAN Schmitz, PA-C 07/10/2024  I have personally performed a face to face diagnostic evaluation of this patient.  Additionally, I have reviewed and concur with the physician assistant's documentation above.  Prentice CHARLENA Compton M.D. [facility] Fellow [employer] Diplomate Am Board of Electrodiagnostic Med Fellow Am Board of Interventional Pain  07/12/24

## 2024-07-12 NOTE — Progress Notes (Signed)
 Initial Nutrition Assessment  DOCUMENTATION CODES:  Not applicable  INTERVENTION:  Diet recommendations per SLP assessment Currently on dysphagia 3 prior to assessment d/t cough with liquids Ensure Plus High Protein po BID, each supplement provides 350 kcal and 20 grams of protein. Juven BID to support wound healing MVI with minerals daily Monitor ostomy output, can consider adding banatrol PO BID if needed   NUTRITION DIAGNOSIS:  Increased nutrient needs related to wound healing as evidenced by estimated needs.  GOAL:  Patient will meet greater than or equal to 90% of their needs  MONITOR:  PO intake, Supplement acceptance, Labs, Weight trends, Skin, I & O's  REASON FOR ASSESSMENT:  Consult Assessment of nutrition requirement/status  ASSESSMENT:  Pt admitted to CIR with debility following most recent admission for fatigue, mental status changes, tachycardia, hypotension and purulent drainage from abdominal wall drain. PMH significant for HFpEF, CAD, T2DM, PE on eliquis , prostate cancer, bladder cancer s/p cystoprostatectomy and ileal conduit (2013), recent admission 04/10/24 for parastomal-incisional abdominal wall hernia repair w/graft and ileal conduit reversal c/b intra-abdominal and abdominal wall abscess from bowel perforation s/p end ileostomy.  RD working remotely. Unsuccessful attempt to reach pt via phone call to room.  Pt was followed by RD team during prior extended admission.  At that time he was receiving Ensure and Juven. Pt currently has Ensure ordered. Recommend continuing this supplements as tolerated.   Will also order additional nutrition supplements to support ongoing wound healing needs.   Notably MD downgraded diet to dysphagia 3 today as pt experienced coughing with liquids. Awaiting SLP evaluation.   Per review of chart, between 5/21 and 8/30 it appears pt's weight has declined 19.7% which is clinically significant for time frame.   Medications: Vitamin  B12 500mcg MWF, imodium  TID, reglan  TID, lovaza  every other day, psyllium daily, IV abx  Labs:  BUN 42 Cr 1.57 GFR 47  NUTRITION - FOCUSED PHYSICAL EXAM: RD working remotely. Deferred to follow up. Suspect pt with a degree of malnutrition though unable to confirm with limited information at this time.   Diet Order:   Diet Order             DIET DYS 3 Room service appropriate? Yes; Fluid consistency: Thin  Diet effective now                   EDUCATION NEEDS:  No education needs have been identified at this time  Skin:  Skin Assessment: Reviewed RN Assessment (open abdominal surgical incision)  Last BM:  x24 hours via ileostomy  Height:  Ht Readings from Last 1 Encounters:  07/12/24 5' 11 (1.803 m)    Weight:  Wt Readings from Last 1 Encounters:  07/11/24 87.5 kg   BMI:  Body mass index is 26.9 kg/m.  Estimated Nutritional Needs:   Kcal:  2200-2400  Protein:  120-135g  Fluid:  >/=2L  Royce Maris, RDN, LDN Clinical Nutrition See AMiON for contact information.

## 2024-07-12 NOTE — Evaluation (Signed)
 Physical Therapy Assessment and Plan  Patient Details  Name: Kristopher Thompson MRN: [id number] Date of Birth: 08-21-1951  PT Diagnosis: Abnormal posture, Abnormality of gait, Cognitive deficits, Difficulty walking, Edema, Impaired cognition, and Muscle weakness Rehab Potential: Fair ELOS: 10-14 days   Today's Date: 07/12/2024 PT Individual Time: 9098-9059 PT Individual Time Calculation (min): 39 min    Hospital Problem: Principal Problem:   Debility   Past Medical History:  Past Medical History:  Diagnosis Date   At risk for sleep apnea    12-25-2017   STOP-BANG SCORE= 5   --- SENT TO PCP   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   moderate atypia - right low back   Atypical nevus 04/04/2007   moderate to marked - right upper back (wider shave)   Atypical nevus 04/04/2007   moderate atypia - center chest (wider shave)   Atypical nevus 04/04/2007   slight atypia - right thigh   Atypical nevus 11/29/2011   mild atypia - center upper back   Atypical nevus 11/29/2011   mild atypia - center chest   Bacteremia due to Klebsiella pneumoniae 10/09/2017   Bladder cancer (HCC) dx 07/2017   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  s/p  cystectomy w/ ileal conduit urinary diversion   Candida infection    CHF (congestive heart failure) (HCC)    Colostomy in place (HCC)    since 08-08-2017-- per pt 12-25-2017 reddness around stoma   Diabetes mellitus without complication (HCC)    GERD (gastroesophageal reflux disease)    H/O hiatal hernia    History of sepsis 09/2017   dx bacteremia due to klebsiella pneumoniae,  post op intraabdominal abscess   Prostate cancer [facility]) urologist-- dr renda   10-02-2012 s/p  prostatectomy-- Stage T1c   RBBB    Renal disorder    pt. denies   Sleep apnea    cpap   Squamous cell carcinoma of skin 05/22/2013   left cheek - CX3 + 5FU   Wears glasses    Past Surgical History:  Past Surgical History:  Procedure Laterality Date   ABDOMINAL SURGERY      APPENDECTOMY  1972   BOWEL RESECTION N/A 04/20/2024   Procedure: SMALL BOWEL RESECTION;  Surgeon: [name], MD;  Location: [facility];  Service: General;  Laterality: N/A;   CHOLECYSTECTOMY  1985   COLONOSCOPY N/A 06/29/2021   Procedure: COLONOSCOPY;  Surgeon: Debby Hila, MD;  Location: [facility];  Service: Endoscopy;  Laterality: N/A;   COLOSTOMY REVERSAL N/A 01/08/2018   Procedure: COLOSTOMY REVERSAL;  Surgeon: Debby Hila, MD;  Location: [facility];  Service: General;  Laterality: N/A;   CYSTOSCOPY WITH RETROGRADE PYELOGRAM, URETEROSCOPY AND STENT PLACEMENT Right 06/10/2017   Procedure: CYSTOSCOPY WITH RIGHT URETEROSCOPY WITH RIGHT STENT PLACEMENT;  Surgeon: [name], MD;  Location: [facility];  Service: Urology;  Laterality: Right;   EUS N/A 04/16/2018   Procedure: FULL UPPER ENDOSCOPIC ULTRASOUND (EUS) RADIAL;  Surgeon: Burnette Fallow, MD;  [facility];  Service: Endoscopy;  Laterality: N/A;   FLEXIBLE SIGMOIDOSCOPY N/A 12/13/2017   Procedure: FLEXIBLE SIGMOIDOSCOPY;  Surgeon: Debby Hila, MD;  [facility];  Service: Endoscopy;  Laterality: N/A;   ILEO CONDUIT     IR CATHETER TUBE CHANGE  12/13/2017   IR CATHETER TUBE CHANGE  01/17/2018   IR CATHETER TUBE CHANGE  02/28/2018   IR CATHETER TUBE CHANGE  07/18/2018   IR CATHETER TUBE CHANGE  08/22/2018   IR CATHETER TUBE CHANGE  10/31/2018   IR CONVERT  LEFT NEPHROSTOMY TO NEPHROURETERAL CATH  10/24/2017   IR EXT NEPHROURETERAL CATH EXCHANGE  05/19/2018   IR EXT NEPHROURETERAL CATH EXCHANGE  06/16/2018   IR EXT NEPHROURETERAL CATH EXCHANGE  07/09/2024   IR NEPHRO TUBE REMOV/FL  10/24/2017   IR NEPHROSTOGRAM LEFT THRU EXISTING ACCESS  12/05/2018   IR NEPHROSTOMY EXCHANGE LEFT  05/11/2024   IR NEPHROSTOMY EXCHANGE LEFT  07/09/2024   IR NEPHROSTOMY PLACEMENT LEFT  10/07/2017   IR NEPHROSTOMY PLACEMENT RIGHT  05/04/2024   IR NEPHROSTOMY TUBE CHANGE  04/11/2018   IR URETERAL STENT PLACEMENT EXISTING ACCESS LEFT  05/13/2024   IR  URETERAL STENT RIGHT NEW ACCESS W/SEP NEPHROSTOMY CATH  05/13/2024   LAPAROTOMY N/A 04/20/2024   Procedure: EXPLORATORY LAPAROTOMY;  Surgeon: [name], MD;  [facility];  Service: General;  Laterality: N/A;   PARTIAL COLECTOMY N/A 04/21/2024   Procedure: COLECTOMY, PARTIAL;  Surgeon: [name], MD;  Location[facility];  Service: General;  Laterality: N/A;  Removal Wound Vac, Washout Ostomy, Possible Anastomosis, Possible Ileostomy.  Phasix Mesh.   POLYPECTOMY  06/29/2021   Procedure: POLYPECTOMY;  Surgeon: Debby Hila, MD;  [facility];  Service: Endoscopy;;   ROBOT ASSISTED LAPAROSCOPIC RADICAL PROSTATECTOMY  10/02/2012   Procedure: ROBOTIC ASSISTED LAPAROSCOPIC RADICAL PROSTATECTOMY LEVEL 2;  Surgeon: Noretta Ferrara, MD;  [facility];  Service: Urology;  Laterality: N/A;   ROBOTIC ASSISTED LAPAROSCOPIC BLADDER DIVERTICULECTOMY N/A 08/08/2017   Procedure: XI ROBOTIC ASSISTED LAPAROSCOPIC RADICAL CYSTECTOMY COVERTED TO OPEN PELVIC LYMPHADNECTOMY BILATERAL AND ILEAL CONDUIT URINARY DIVERSION;  Surgeon: Ferrara Glance, MD;  [facility];  Service: Urology;  Laterality: N/A;   TRANSURETHRAL RESECTION OF BLADDER TUMOR  06/10/2017   Procedure: TRANSURETHRAL RESECTION OF BLADDER TUMOR (TURBT);  Surgeon: Ferrara Glance, MD;  [facility];  Service: Urology;;   VACUUM ASSISTED CLOSURE CHANGE N/A 04/20/2024   Procedure: PLACEMENT OF CONCETTA CAPES;  Surgeon: [name], MD;  Location[facility];  Service: General;  Laterality: N/A;   VENTRAL HERNIA REPAIR N/A 04/10/2024   Procedure: REPAIR, HERNIA, VENTRAL;  Surgeon: [name], MD;  [facility];  Service: General;  Laterality: N/A;   XI ROBOT ABDOMINAL PERINEAL RESECTION N/A 08/08/2017   Procedure: REPAIR OF RECTAL TEAR POSSIBLE PARTIAL PROCTECTOMY, CREATION OF  OSTOMY;  Surgeon: Debby Hila, MD;  [facility];  Service: General;  Laterality: N/A;   XI ROBOTIC ASSISTED PARASTOMAL HERNIA REPAIR N/A 04/10/2024   Procedure:  REPAIR, HERNIA, PARASTOMAL AND VENTRAL HERNIAS, ROBOT-ASSISTED, LEFT INGUINAL HERNIA, LYSIS OF ADHESIONS INCARCERATED AND INSIONAL HERNIAS AND UROSTOMY REVISION;  Surgeon: [name], MD;  Location: [facility];  Service: General;  Laterality: N/A;    Assessment & Plan Clinical Impression: Patient is a 73 y.o. year old male with history of OSA, CHF, HFpEF, CAD, T2DM, PE- on eliquis , prostate CA, bladder cancer s/p cystoprostatectomy and ileal conduit 2013, recent admission 04/10/24 for parastomal-incisional abdominal wall hernia repair w/graft and ileal conduit reversal complicated by intra abdominal and abdominal wall abscess from bowel perforation s/p end ileostomy, acute on chronic renal failure, leak from distal end of conduit s/p bilateral nephrostomy tube placement with improvement in SCr, treated with 4 week course of Zosyn /fland prolonged stay requiring CIR 07/16-08/08/25. He was readmitted on 07/01/24 with fatigue, MS changes, tachycardia with hypotension,a acute on chronic renal failure with rise in SCr to 3.57 and leucocytosis with WBC 17. He was found to have purulent drainage from abdominal wall drain and started on broad spectrum antibiotics due to concerns  of RLL aspiration PNA and acute pyleonephritis ad CT abdomen pelvis revealed left hydronephrosis and hydroureter.  Nephrostomy tubes uncapped by urology. BC X 2 negative. UCS showed MRSA and E faecium and JP drain culture positive for E coli and Corynebacterium species. Dr. Macel consulted for input on renal failure with hyponatremia, metabolic acidosis and hypercalcemia felt to be due to  obstruction with poor intake and dehydration. He improved with LR for hydration and Dr. Shane following for input. Abdominal wall drain removed on 08/21 by Dr. Sheldon due to concerns of source of infection and imodium  and fiber added to thicken watery ileostomy effluent.    Dr. Overton consulted for input and recommends  6 weeks IV Vanc/Ceftriaxone  followed by  oral suppression f to cover Cornyebacterium and E coli if possible for 6 months total of antibiotics. As SCr improved and right nephrostomy tube clamped on 08/25. On 08/27, patient noted to be more confused and slight uptrend in SCr noted. He underwent exchange of bilateral PCN tube and replacement of right ureteral stent by Dr. Jenna. Right nephrostomy tube remains capped but large amount of urine noted in urostomy bag. Drainage from prior right drain site improving. Hypotension treated with IV hydrocortisone  with minimal response and BP remains soft. PT/OT consulted and working with patient who is limited by debility as ell as HR up to 130-150 with SPT tranfers. He requires mod assist  to max assist for pregait activity and able to stand for a few seconds. He was also noted to have cognitive deficits needing increased time to process, has decreased verbal output and able to follow simple one step command with verbal/gestural cues to perform ADLs.     Patient currently requires max with mobility secondary to muscle weakness and muscle joint tightness, decreased cardiorespiratoy endurance, decreased coordination, decreased memory, and decreased sitting balance, decreased postural control, and decreased balance strategies.  Prior to hospitalization, patient was modified independent  with mobility and lived with Spouse in a House home.  Home access is 2Stairs to enter.  Patient will benefit from skilled PT intervention to maximize safe functional mobility, minimize fall risk, and decrease caregiver burden for planned discharge home with 24 hour supervision.  Anticipate patient will benefit from follow up HH at discharge.  PT - End of Session Activity Tolerance: Decreased this session Endurance Deficit: Yes PT Assessment Rehab Potential (ACUTE/IP ONLY): Fair PT Barriers to Discharge: Inaccessible home environment;Decreased caregiver support;Home environment access/layout;Wound Care;Lack of/limited family  support;Weight;Nutrition means PT Patient demonstrates impairments in the following area(s): Balance;Edema;Endurance;Motor;Pain;Safety;Skin Integrity PT Transfers Functional Problem(s): Bed Mobility;Bed to Chair;Car PT Locomotion Functional Problem(s): Ambulation;Stairs PT Plan PT Intensity: Minimum of 1-2 x/day ,45 to 90 minutes PT Frequency: 5 out of 7 days PT Duration Estimated Length of Stay: 10-14 days PT Treatment/Interventions: Ambulation/gait training;Discharge planning;Functional mobility training;Therapeutic Activities;Balance/vestibular training;Disease management/prevention;Neuromuscular re-education;Skin care/wound management;Therapeutic Exercise;Wheelchair propulsion/positioning;DME/adaptive equipment instruction;Pain management;UE/LE Strength taining/ROM;Community reintegration;Patient/family education;Stair training;UE/LE Coordination activities PT Transfers Anticipated Outcome(s): supervision PT Locomotion Anticipated Outcome(s): supervision/CGA PT Recommendation Recommendations for Other Services: Therapeutic Recreation consult;Neuropsych consult Therapeutic Recreation Interventions: Stress management Follow Up Recommendations: Home health PT Patient destination: Home Equipment Recommended: To be determined   PT Evaluation Precautions/Restrictions Precautions Precautions: Fall Recall of Precautions/Restrictions: Impaired Precaution/Restrictions Comments: urostomy, colostomy, lt PCN drain, watch BP and HR Restrictions Weight Bearing Restrictions Per Provider Order: No Pain Interference Pain Interference Pain Effect on Sleep: 3. Frequently Pain Interference with Therapy Activities: 1. Rarely or not at all Pain Interference with Day-to-Day Activities: 4. Almost constantly Home  Living/Prior Functioning Home Living Available Help at Discharge: Family;Available 24 hours/day Type of Home: House Home Access: Stairs to enter [employer] of Steps: 2 Entrance  Stairs-Rails: Can reach both Home Layout: Two level;Able to live on main level with bedroom/bathroom Alternate Level Stairs-Number of Steps: flight Alternate Level Stairs-Rails: Left Bathroom Shower/Tub: Engineer, manufacturing systems: Standard Bathroom Accessibility: Yes  Lives With: Spouse Prior Function Level of Independence: Independent with basic ADLs;Independent with homemaking with ambulation;Independent with gait;Independent with transfers  Able to Take Stairs?: Yes Driving: No Vocation: Retired Vision/Perception  Vision - History Ability to See in Adequate Light: 0 Adequate  Cognition Overall Cognitive Status: Impaired/Different from baseline Arousal/Alertness: Awake/alert Orientation Level: Oriented X4 Year: 2025 Month: August Day of Week: Correct Memory: Impaired Memory Impairment: Decreased recall of new information;Retrieval deficit;Storage deficit Awareness: Appears intact Problem Solving: Appears intact Safety/Judgment: Appears intact Sensation Sensation Light Touch: Appears Intact Hot/Cold: Appears Intact Proprioception: Appears Intact Additional Comments: light touch in tact in all dermatomes, pt denies any N&T Coordination Gross Motor Movements are Fluid and Coordinated: No Fine Motor Movements are Fluid and Coordinated: Yes Coordination and Movement Description: Deficits due to generalized weakness/debility and pain. Motor  Motor Motor: Other (comment) Motor - Skilled Clinical Observations: Deficits due to generalized weakness/debility and pain.  Trunk/Postural Assessment  Cervical Assessment Cervical Assessment: Exceptions to [facility] (forward head) Thoracic Assessment Thoracic Assessment: Exceptions to [facility] (rounded shoulders, kyphosis) Lumbar Assessment Lumbar Assessment: Exceptions to [facility] (posterior pelvic titl) Postural Control Postural Control: Deficits on evaluation Head Control: weak cervical extensors Righting Reactions: dealyed,  inadequate Protective Responses: dealyed, inadequate  Balance Balance Balance Assessed: Yes Static Sitting Balance Static Sitting - Balance Support: Feet supported Static Sitting - Level of Assistance: 5: Stand by assistance;4: Min assist Dynamic Sitting Balance Dynamic Sitting - Balance Support: Feet supported Dynamic Sitting - Level of Assistance: 4: Min assist;3: Mod assist Extremity Assessment  RLE Assessment RLE Assessment: Exceptions to [facility] General Strength Comments: grossly 2/5 LLE Assessment LLE Assessment: Exceptions to [facility] General Strength Comments: grossly 2/5  Care Tool Care Tool Bed Mobility Roll left and right activity   Roll left and right assist level: Maximal Assistance - Patient 25 - 49%    Sit to lying activity   Sit to lying assist level: Maximal Assistance - Patient 25 - 49%    Lying to sitting on side of bed activity   Lying to sitting on side of bed assist level: the ability to move from lying on the back to sitting on the side of the bed with no back support.: Maximal Assistance - Patient 25 - 49%     Care Tool Transfers Sit to stand transfer Sit to stand activity did not occur: Safety/medical concerns (fatigue/dizziness/orthostatics)      Chair/bed transfer Chair/bed transfer activity did not occur: Safety/medical concerns (fatigue/dizziness/orthostatics)      Licensed conveyancer transfer activity did not occur: Safety/medical concerns (fatigue/dizziness/orthostatics)        Care Tool Locomotion Ambulation Ambulation activity did not occur: Safety/medical concerns (fatigue/dizziness/orthostatics)        Walk 10 feet activity Walk 10 feet activity did not occur: Safety/medical concerns (fatigue/dizziness/orthostatics)       Walk 50 feet with 2 turns activity Walk 50 feet with 2 turns activity did not occur: Safety/medical concerns (fatigue/dizziness/orthostatics)      Walk 150 feet activity Walk 150 feet activity did not occur: Safety/medical  concerns (fatigue/dizziness/orthostatics)      Walk 10 feet on uneven surfaces activity Walk  10 feet on uneven surfaces activity did not occur: Safety/medical concerns (fatigue/dizziness/orthostatics)      Stairs Stair activity did not occur: Safety/medical concerns (fatigue/dizziness/orthostatics)        Walk up/down 1 step activity Walk up/down 1 step or curb (drop down) activity did not occur: Safety/medical concerns (fatigue/dizziness/orthostatics)      Walk up/down 4 steps activity Walk up/down 4 steps activity did not occur: Safety/medical concerns (fatigue/dizziness/orthostatics)      Walk up/down 12 steps activity Walk up/down 12 steps activity did not occur: Safety/medical concerns (fatigue/dizziness/orthostatics)      Pick up small objects from floor Pick up small object from the floor (from standing position) activity did not occur: Safety/medical concerns (fatigue/dizziness/orthostatics)      Wheelchair Is the patient using a wheelchair?: No          Wheel 50 feet with 2 turns activity Wheelchair 50 feet with 2 turns activity did not occur: Safety/medical concerns    Wheel 150 feet activity Wheelchair 150 feet activity did not occur: Safety/medical concerns      Refer to Care Plan for Long Term Goals  SHORT TERM GOAL WEEK 1 PT Short Term Goal 1 (Week 1): pt will perform bed mobility with min A with LRAD PT Short Term Goal 2 (Week 1): pt will perform sit to stand with mod A or less PT Short Term Goal 3 (Week 1): pt will initiate gait training  Recommendations for other services: Neuropsych and Therapeutic Recreation  Pet therapy and Stress management  Skilled Therapeutic Intervention Mobility Bed Mobility Bed Mobility: Rolling Right;Rolling Left;Right Sidelying to Sit;Supine to Sit;Sit to Supine;Sitting - Scoot to Edge of Bed Rolling Right: Maximal Assistance - Patient 25-49% Rolling Left: Maximal Assistance - Patient 25-49% Right Sidelying to Sit: Maximal  Assistance - Patient 25-49% Supine to Sit: Maximal Assistance - Patient - Patient 25-49% Sit to Supine: Maximal Assistance - Patient 25-49% Scooting to HOB: Maximal Assistance - Patient 25-49% Locomotion  Gait Ambulation: No Gait Gait: No Stairs / Additional Locomotion Stairs: No Wheelchair Mobility Wheelchair Mobility: No   Discharge Criteria: Patient will be discharged from PT if patient refuses treatment 3 consecutive times without medical reason, if treatment goals not met, if there is a change in medical status, if patient makes no progress towards goals or if patient is discharged from hospital.  The above assessment, treatment plan, treatment alternatives and goals were discussed and mutually agreed upon: by patient and by family  Today's interventions  Evaluation completed (see details above and below) with education on PT POC and goals and individual treatment initiated with focus on activity tolerance.   Pt supine in bed upon arrival. Pt agreeable to therapy. Pt reports 3/10 pain, premedicated. Therapist provided rest breaks and repositioning as needed.   Bed mobility: max A Sitting EOB: pt endorses dizzess, diminished with prolonged sitting however pt requesting to lay down 2/2 fatigue.   Vitals assessed:  Supine: 128/77 HR 91  Sitting EOB: 99/82 HR 115 - pt endorses dizziness  Sitting EOB post 5 min: BP 122/87 HR 125-pt requesting to lay back down 2/2 fatigue.   Pt refusing to get OOB in [facility] or recliner at this time.   Notified nursing.   Pt missed    [facility], [place], DPT  07/12/2024, 4:04 PM

## 2024-07-12 NOTE — Progress Notes (Signed)
 PMR Admission Coordinator Pre-Admission Assessment   Patient: Kristopher Thompson is an 73 y.o., male MRN: [id number] DOB: Jun 26, 1951 Height: 5' 11 (180.3 cm) Weight: 89 kg   Insurance Information HMO:     PPO:      PCP:      IPA:      80/20: yes     OTHER:  PRIMARY: Medicare Part A and B      Policy#: [id number]      Subscriber: patient   Benefits:  Phone #: verified eligibility via [employer] on 07/04/24     Name:  Eff. Date: Part A and B effective 05/12/17    Deduct: $1,676      Out of Pocket Max: NA      Life Max: NA CIR: 100% coverage      SNF: 100% coverage for days 1-20, 80% coverage for days 21-100 Outpatient: 80% coverage     Co-Pay: 20% Home Health: 100% coverage      Co-Pay:  DME: 80% coverage     Co-Pay: 20% Providers: pt's choice SECONDARY: A[employer]      Policy#: [id number]     Phone#: [phone]   Financial Counselor:       Phone#:    The "Data Collection Information Summary" for patients in Inpatient Rehabilitation Facilities with attached "Privacy Act Statement-Health Care Records" was provided and verbally reviewed with: Patient and Family   Emergency Contact Information Contact Information       Name Relation Home Work Mobile    Lemus,Debbie Spouse [phone]   [phone]         Other Contacts   None on File      Current Medical History  Patient Admitting Diagnosis: MRSA UTI   History of Present Illness: Pt is a 73 year old male with medical hx  significant for: HTN, CHFpEF, CAD, DM II, PE on Eliquis , prostate CA, bladder CA. Pt presented to [facility] on 07/01/24 d/t fatigue, nausea. Also had decreased urine output. Pt hospitalized in May for left inguinal incarcerated hernia repair with mesh and urostomy ileal conduit revision. Pt then developed perforated bowel and intra-abdominal abscess with small bowel resection and wound VAC placement. Pt had repeat surgeries on 6/10 with abdominal wall debridement, ileal resection, end ileostomy, abdominal  wall hernia repairs and wound VAC. Also in June pt required dialysis. Nephrostomy tubes exchanged on 6/30. On 7/2, pt had right percutaneous nephrostomy exchange with ureteral stent. Discharged on 7/11. Pt readmitted on 7/14 with AKI. Pt admitted to CIR from 05/27/24-06/19/24.    In ED, pt's white count was 16.9 and creatinine was 3.57. Started on sepsis protocol.Chest x-ray negative. Nephrostomy tubes were uncapped and nephrostomy bags replaced. Urology, Surgery and Nephrology consulted. CT abdomen showed left-sided hydronephrosis and hydroureter associated with perinephric which is concerning for acute pyelonephritis. CT chest revealed ground glass opacity of right lung base which is concerning for PNA versus aspiration. RLQ abdominal drain removed on 8/21. On 8/28 exchange of bilateral nephrostomy catheters per IR. R nephrostomy tube capped 8/29. Pt. Seen by PT/OT/SLP and they recommend CIR to assist return to PLOF.     Patient's medical record from [facility] has been reviewed by the rehabilitation admission coordinator and physician.   Past Medical History      Past Medical History:  Diagnosis Date   At risk for sleep apnea      12-25-2017   STOP-BANG SCORE= 5   --- SENT TO PCP   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  moderate atypia - right low back   Atypical nevus 04/04/2007    moderate to marked - right upper back (wider shave)   Atypical nevus 04/04/2007    moderate atypia - center chest (wider shave)   Atypical nevus 04/04/2007    slight atypia - right thigh   Atypical nevus 11/29/2011    mild atypia - center upper back   Atypical nevus 11/29/2011    mild atypia - center chest   Bacteremia due to Klebsiella pneumoniae 10/09/2017   Bladder cancer (HCC) dx 07/2017    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  s/p  cystectomy w/ ileal conduit urinary diversion   Candida infection     CHF (congestive heart failure) (HCC)     Colostomy in place [facility])      since 08-08-2017-- per pt  12-25-2017 reddness around stoma   Diabetes mellitus without complication (HCC)     GERD (gastroesophageal reflux disease)     H/O hiatal hernia     History of sepsis 09/2017    dx bacteremia due to klebsiella pneumoniae,  post op intraabdominal abscess   Prostate cancer [facility]) urologist-- dr renda    10-02-2012 s/p  prostatectomy-- Stage T1c   RBBB     Renal disorder      pt. denies   Sleep apnea      cpap   Squamous cell carcinoma of skin 05/22/2013    left cheek - CX3 + 5FU   Wears glasses          Has the patient had major surgery during 100 days prior to admission? Yes   Family History   family history includes Colon cancer in an other family member; Hypertension in his father; Lung cancer in his mother.   Current Medications  Current Medications    Current Facility-Administered Medications:    acetaminophen  (TYLENOL ) tablet 325-650 mg, 325-650 mg, Oral, Q4H PRN, Pratt, Tanya S, MD, 650 mg at 07/03/24 0125   alum & mag hydroxide-simeth (MAALOX/MYLANTA) 200-200-20 MG/5ML suspension 15 mL, 15 mL, Oral, Q4H PRN, Fredirick Glenys RAMAN, MD   apixaban  (ELIQUIS ) tablet 5 mg, 5 mg, Oral, BID, Pratt, Tanya S, MD, 5 mg at 07/08/24 2150   cefTRIAXone  (ROCEPHIN ) 2 g in sodium chloride  0.9 % 100 mL IVPB, 2 g, Intravenous, Q24H, Vu, Trung T, MD, Last Rate: 200 mL/hr at 07/09/24 0004, 2 g at 07/09/24 0004   Chlorhexidine  Gluconate Cloth 2 % PADS 6 each, 6 each, Topical, Daily, Arlon Carliss ORN, DO, 6 each at 07/08/24 1026   cyanocobalamin  (VITAMIN B12) tablet 500 mcg, 500 mcg, Oral, Q M,W,F, Fredirick Glenys RAMAN, MD, 500 mcg at 07/08/24 1022   cyclobenzaprine  (FLEXERIL ) tablet 5 mg, 5 mg, Oral, TID PRN, Fredirick Glenys RAMAN, MD   cyproheptadine  (PERIACTIN ) 4 MG tablet 4 mg, 4 mg, Oral, QHS, Fredirick Glenys RAMAN, MD, 4 mg at 07/08/24 2150   feeding supplement (ENSURE PLUS HIGH PROTEIN) liquid 237 mL, 237 mL, Oral, BID BM, Fredirick Glenys RAMAN, MD   fludrocortisone  (FLORINEF ) tablet 0.2 mg, 0.2 mg, Oral, Daily, Fredirick Glenys RAMAN, MD, 0.2 mg at 07/08/24 1022   Gerhardt's butt cream 1 Application, 1 Application, Topical, BID, Fredirick Glenys RAMAN, MD, 1 Application at 07/08/24 2151   hydrOXYzine  (ATARAX ) tablet 25 mg, 25 mg, Oral, TID PRN, Fredirick Glenys RAMAN, MD   iohexol  (OMNIPAQUE ) 300 MG/ML solution 50 mL, 50 mL, Per Tube, Once PRN, Jenna Cordella LABOR, MD   ipratropium (ATROVENT ) 0.03 % nasal spray 1 spray, 1  spray, Each Nare, Q8H PRN, Fredirick, Glenys RAMAN, MD   leptospermum manuka honey (MEDIHONEY) paste 1 Application, 1 Application, Topical, Daily, Fredirick Glenys RAMAN, MD, 1 Application at 07/08/24 1032   loperamide  (IMODIUM ) capsule 4 mg, 4 mg, Oral, TID, [name], MD, 4 mg at 07/08/24 2150   melatonin tablet 3 mg, 3 mg, Oral, QHS, Fredirick Glenys RAMAN, MD, 3 mg at 07/08/24 2150   metoCLOPramide  (REGLAN ) injection 10 mg, 10 mg, Intravenous, Q6H PRN, Fredirick Glenys RAMAN, MD   metoCLOPramide  (REGLAN ) tablet 5 mg, 5 mg, Oral, TID AC, Pratt, Tanya S, MD, 5 mg at 07/08/24 1641   midodrine  (PROAMATINE ) tablet 15 mg, 15 mg, Oral, TID WC, Fredirick Glenys RAMAN, MD, 15 mg at 07/08/24 1642   multivitamin with minerals tablet 1 tablet, 1 tablet, Oral, Daily, Fredirick Glenys RAMAN, MD, 1 tablet at 07/07/24 9075   nutrition supplement (JUVEN) (JUVEN) powder packet 1 packet, 1 packet, Oral, BID BM, Fredirick Glenys RAMAN, MD, 1 packet at 07/07/24 1431   omega-3 acid ethyl esters (LOVAZA ) capsule 1 g, 1 g, Oral, QODAY, Pratt, Tanya S, MD, 1 g at 07/07/24 9076   Oral care mouth rinse, 15 mL, Mouth Rinse, 4 times per day, Arlon, Derek W, DO, 15 mL at 07/08/24 2151   Oral care mouth rinse, 15 mL, Mouth Rinse, PRN, Arlon, Derek W, DO   oxyCODONE -acetaminophen  (PERCOCET/ROXICET) 5-325 MG per tablet 2 tablet, 2 tablet, Oral, Q6H PRN, Fredirick Glenys RAMAN, MD, 2 tablet at 07/06/24 1556   prochlorperazine  (COMPAZINE ) tablet 5 mg, 5 mg, Oral, Q6H PRN, Fredirick Glenys RAMAN, MD   psyllium (HYDROCIL/METAMUCIL) 1 packet, 1 packet, Oral, Daily, Fredirick Glenys RAMAN, MD, 1 packet at 07/06/24 9071    rosuvastatin  (CRESTOR ) tablet 10 mg, 10 mg, Oral, QPM, Pratt, Tanya S, MD, 10 mg at 07/08/24 1816   sertraline  (ZOLOFT ) tablet 100 mg, 100 mg, Oral, Daily, Pratt, Tanya S, MD, 100 mg at 07/08/24 1023   sodium chloride  (OCEAN) 0.65 % nasal spray 1-2 spray, 1-2 spray, Each Nare, Q6H PRN, Fredirick Glenys RAMAN, MD   sodium chloride  flush (NS) 0.9 % injection 5 mL, 5 mL, Intracatheter, Q8H, Jenna Cordella LABOR, MD   vancomycin  variable dose per unstable renal function (pharmacist dosing), , Does not apply, See admin instructions, Gretel Prentice BIRCH, [facility]     Patients Current Diet:  Diet Order                  Diet regular Room service appropriate? Yes; Fluid consistency: Thin  Diet effective now                       Precautions / Restrictions Precautions Precautions: Fall, Other (comment) Precaution/Restrictions Comments: urostomy, colostomy, lt PCN drain, watch BP and HR Restrictions Weight Bearing Restrictions Per Provider Order: No    Has the patient had 2 or more falls or a fall with injury in the past year? No   Prior Activity Level Limited Community (1-2x/wk): doctor's appointments   Prior Functional Level Self Care: Did the patient need help bathing, dressing, using the toilet or eating? Needed some help   Indoor Mobility: Did the patient need assistance with walking from room to room (with or without device)? Independent   Stairs: Did the patient need assistance with internal or external stairs (with or without device)? Independent   Functional Cognition: Did the patient need help planning regular tasks such as shopping or remembering to take medications? Needed some help  Patient Information Are you of Hispanic, Latino/a,or Spanish origin?: A. No, not of Hispanic, Latino/a, or Spanish origin What is your race?: A. White Do you need or want an interpreter to communicate with a doctor or health care staff?: 0. No   Patient's Response To:  Health Literacy and Transportation Is  the patient able to respond to health literacy and transportation needs?: Yes Health Literacy - How often do you need to have someone help you when you read instructions, pamphlets, or other written material from your doctor or pharmacy?: Never In the past 12 months, has lack of transportation kept you from medical appointments or from getting medications?: No In the past 12 months, has lack of transportation kept you from meetings, work, or from getting things needed for daily living?: No   Home Assistive Devices / Equipment Home Equipment: Rollator (4 wheels), Agricultural consultant (2 wheels)   Prior Device Use: Indicate devices/aids used by the patient prior to current illness, exacerbation or injury? Manual wheelchair and Walker   Current Functional Level Cognition   Orientation Level: Oriented X4    Extremity Assessment (includes Sensation/Coordination)   Upper Extremity Assessment: Generalized weakness  Lower Extremity Assessment: Generalized weakness     ADLs   Overall ADL's : Needs assistance/impaired Eating/Feeding: Independent, Sitting Grooming: Sitting, Contact guard assist, Wash/dry face Upper Body Bathing: Sitting, Contact guard assist Lower Body Bathing: Moderate assistance, Sitting/lateral leans Upper Body Dressing : Sitting, Minimal assistance Lower Body Dressing: Total assistance, Bed level Lower Body Dressing Details (indicate cue type and reason): don socks, TED hoses, wrapped BLEs. Toilet Transfer: Rolling walker (2 wheels), Stand-pivot, Maximal assistance Toilet Transfer Details (indicate cue type and reason): simulated with recliner t.f Toileting- Clothing Manipulation and Hygiene: Maximal assistance, +2 for physical assistance, +2 for safety/equipment, Sit to/from stand Functional mobility during ADLs: Maximal assistance (rec +2 for safety) General ADL Comments: not able to progress to standing ADLs using stedy. HR too high once in perched position.     Mobility    Overal bed mobility: Needs Assistance Bed Mobility: Rolling, Sidelying to Sit Rolling: Max assist, Used rails Sidelying to sit: Mod assist, +2 for physical assistance Supine to sit: Mod assist, HOB elevated, Used rails Sit to supine: Mod assist, Used rails General bed mobility comments: max assist to roll right this session after ted hose donned with mod assist to clear legs off bed and elevate trunk to sitting. mod assist to scoot to EOB     Transfers   Overall transfer level: Needs assistance Equipment used: Rolling walker (2 wheels) Transfers: Sit to/from Stand Sit to Stand: Mod assist, +2 physical assistance Bed to/from chair/wheelchair/BSC transfer type:: Stand pivot Stand pivot transfers: Mod assist, +2 safety/equipment Step pivot transfers: Mod assist, +2 physical assistance Transfer via Lift Equipment: Stedy General transfer comment: mod +2 assist to rise from bed with axillary assist and cues for anterior weight shift and hip extension. Pt able to step with shuffling feet bed to chair with use of RW and mod assist +2 for safety. repeated stand from chair with mod assist to scoot to edge and min +2 to stand from surface, unable to march in standing and stood grossly 15 sec     Ambulation / Gait / Stairs / Wheelchair Mobility   Ambulation/Gait Ambulation/Gait assistance: Mod assist Gait Distance (Feet): 2 Feet Assistive device: Rolling walker (2 wheels) Gait Pattern/deviations: Step-to pattern General Gait Details: not yet able     Posture / Balance Dynamic Sitting Balance Sitting balance -  Comments: CGA Balance Overall balance assessment: Needs assistance Sitting-balance support: Feet supported, No upper extremity supported Sitting balance-Leahy Scale: Fair Sitting balance - Comments: CGA Postural control: Right lateral lean Standing balance support: Bilateral upper extremity supported, Reliant on assistive device for balance, During functional activity Standing  balance-Leahy Scale: Poor Standing balance comment: Rw and assist in standing     Special considerations/life events  Active with [facility] for HH RN, PR and IV antibiotics arranged with Holley Herring with [facility]. On Ceftriaxone  and Vanc IV at home with end date of 08/13/24    Previous Home Environment  Living Arrangements: Spouse/significant other  Lives With: Spouse Available Help at Discharge: Family, Available 24 hours/day Type of Home: House Home Layout: Two level, Able to live on main level with bedroom/bathroom Home Access: Stairs to enter Entrance Stairs-Rails: Can reach both Entrance Stairs-Number of Steps: 2 Bathroom Shower/Tub: Associate Professor: Yes How Accessible: Accessible via walker Home Care Services: No (was scheduled to have first session the day of hospital admission)   Discharge Living Setting Plans for Discharge Living Setting: Patient's home Type of Home at Discharge: House Discharge Home Layout: Able to live on main level with bedroom/bathroom Discharge Home Access: Stairs to enter Entrance Stairs-Rails: Can reach both Entrance Stairs-Number of Steps: 2 Discharge Bathroom Shower/Tub: Tub/shower unit Discharge Bathroom Toilet: Standard Discharge Bathroom Accessibility: Yes How Accessible: Accessible via walker Does the patient have any problems obtaining your medications?: No   Social/Family/Support Systems Anticipated Caregiver: DebbieLavinder, wife Anticipated Caregiver's Contact Information: [phone] Caregiver Availability: 24/7 Discharge Plan Discussed with Primary Caregiver: Yes Is Caregiver In Agreement with Plan?: Yes Does Caregiver/Family have Issues with Lodging/Transportation while Pt is in Rehab?: No   Goals Patient/Family Goal for Rehab: supervision PT, OT and SLP Expected length of stay: ELOS 10 to 14 days Pt/Family Agrees to Admission and willing to participate: Yes Program  Orientation Provided & Reviewed with Pt/Caregiver Including Roles  & Responsibilities: Yes   Decrease burden of Care through IP rehab admission: NA   Possible need for SNF placement upon discharge: Not anticipated   Patient Condition: I have reviewed medical records from [facility], spoken with CM, and patient and spouse. I met with patient at the bedside for inpatient rehabilitation assessment.  Patient will benefit from ongoing PT and OT, can actively participate in 3 hours of therapy a day 5 days of the week, and can make measurable gains during the admission.  Patient will also benefit from the coordinated team approach during an Inpatient Acute Rehabilitation admission.  The patient will receive intensive therapy as well as Rehabilitation physician, nursing, social worker, and care management interventions.  Due to bladder management, bowel management, safety, skin/wound care, disease management, medication administration, pain management, and patient education the patient requires 24 hour a day rehabilitation nursing.  The patient is currently Mod A+2 with mobility and basic ADLs.  Discharge setting and therapy post discharge at home with home health is anticipated.  Patient has agreed to participate in the Acute Inpatient Rehabilitation Program and will admit tomorrow.   Preadmission Screen Completed By:  Tinnie SHAUNNA Yvone Delayne, 07/09/2024 1:19 PM ______________________________________________________________________   Discussed status with Dr. Carilyn on 07/10/24 at 900 and received approval for admission tomorrow.   Admission Coordinator:  Tinnie SHAUNNA Yvone Delayne, CCC-SLP, time 1040 Date 07/10/24 with updates by [name] Kleine, MS, CCC-SLP     Assessment/Plan: Diagnosis: Debility due to sepsis Does the need for close, 24 hr/day Medical  supervision in concert with the patient's rehab needs make it unreasonable for this patient to be served in a less intensive setting?  Yes Co-Morbidities requiring supervision/potential complications: Ileostomy, urostomy , nephrostomy, open abd wound healing by secondary intention Due to bladder management, bowel management, safety, skin/wound care, disease management, medication administration, pain management, and patient education, does the patient require 24 hr/day rehab nursing? Yes Does the patient require coordinated care of a physician, rehab nurse, PT, OT, and SLP to address physical and functional deficits in the context of the above medical diagnosis(es)? Yes Addressing deficits in the following areas: balance, endurance, locomotion, strength, transferring, bathing, dressing, grooming, toileting, and psychosocial support Can the patient actively participate in an intensive therapy program of at least 3 hrs of therapy 5 days a week? Yes The potential for patient to make measurable gains while on inpatient rehab is good and fair Anticipated functional outcomes upon discharge from inpatient rehab: supervision PT, supervision and min assist OT, modified independent SLP Estimated rehab length of stay to reach the above functional goals is: 10-14d Anticipated discharge destination: Home 10. Overall Rehab/Functional Prognosis: fair     MD Signature: Prentice CHARLENA Compton M.D. [facility] Fellow Am Acad of Phys Med and Rehab Diplomate Am Board of Electrodiagnostic Med Fellow Am Board of Interventional Pain

## 2024-07-12 NOTE — Plan of Care (Signed)
  Problem: RH Balance Goal: LTG Patient will maintain dynamic sitting balance (PT) Description: LTG:  Patient will maintain dynamic sitting balance with assistance during mobility activities (PT) Flowsheets (Taken 07/12/2024 1606) LTG: Pt will maintain dynamic sitting balance during mobility activities with:: Supervision/Verbal cueing Goal: LTG Patient will maintain dynamic standing balance (PT) Description: LTG:  Patient will maintain dynamic standing balance with assistance during mobility activities (PT) Flowsheets (Taken 07/12/2024 1606) LTG: Pt will maintain dynamic standing balance during mobility activities with:: Supervision/Verbal cueing   Problem: Sit to Stand Goal: LTG:  Patient will perform sit to stand with assistance level (PT) Description: LTG:  Patient will perform sit to stand with assistance level (PT) Flowsheets (Taken 07/12/2024 1606) LTG: PT will perform sit to stand in preparation for functional mobility with assistance level: Supervision/Verbal cueing   Problem: RH Bed Mobility Goal: LTG Patient will perform bed mobility with assist (PT) Description: LTG: Patient will perform bed mobility with assistance, with/without cues (PT). Flowsheets (Taken 07/12/2024 1606) LTG: Pt will perform bed mobility with assistance level of: Supervision/Verbal cueing   Problem: RH Bed to Chair Transfers Goal: LTG Patient will perform bed/chair transfers w/assist (PT) Description: LTG: Patient will perform bed to chair transfers with assistance (PT). Flowsheets (Taken 07/12/2024 1606) LTG: Pt will perform Bed to Chair Transfers with assistance level: Supervision/Verbal cueing   Problem: RH Car Transfers Goal: LTG Patient will perform car transfers with assist (PT) Description: LTG: Patient will perform car transfers with assistance (PT). Flowsheets (Taken 07/12/2024 1606) LTG: Pt will perform car transfers with assist:: Contact Guard/Touching assist   Problem: RH Ambulation Goal: LTG  Patient will ambulate in controlled environment (PT) Description: LTG: Patient will ambulate in a controlled environment, # of feet with assistance (PT). Flowsheets (Taken 07/12/2024 1606) LTG: Pt will ambulate in controlled environ  assist needed:: Supervision/Verbal cueing LTG: Ambulation distance in controlled environment: 150 feet with LRAD Goal: LTG Patient will ambulate in home environment (PT) Description: LTG: Patient will ambulate in home environment, # of feet with assistance (PT). Flowsheets (Taken 07/12/2024 1606) LTG: Pt will ambulate in home environ  assist needed:: Supervision/Verbal cueing LTG: Ambulation distance in home environment: 75 feet with LRAD   Problem: RH Stairs Goal: LTG Patient will ambulate up and down stairs w/assist (PT) Description: LTG: Patient will ambulate up and down # of stairs with assistance (PT) Flowsheets (Taken 07/12/2024 1606) LTG: Pt will ambulate up/down stairs assist needed:: Supervision/Verbal cueing LTG: Pt will  ambulate up and down number of stairs: 4 steps with LRAD

## 2024-07-12 NOTE — Plan of Care (Signed)
  Problem: Consults Goal: RH GENERAL PATIENT EDUCATION Description: See Patient Education module for education specifics. Outcome: Progressing   Problem: RH SAFETY Goal: RH STG ADHERE TO SAFETY PRECAUTIONS W/ASSISTANCE/DEVICE Description: STG Adhere to Safety Precautions With Assistance/Device. Outcome: Progressing

## 2024-07-13 DIAGNOSIS — G7281 Critical illness myopathy: Secondary | ICD-10-CM

## 2024-07-13 DIAGNOSIS — N179 Acute kidney failure, unspecified: Secondary | ICD-10-CM

## 2024-07-13 DIAGNOSIS — N189 Chronic kidney disease, unspecified: Secondary | ICD-10-CM

## 2024-07-13 DIAGNOSIS — I951 Orthostatic hypotension: Secondary | ICD-10-CM

## 2024-07-13 DIAGNOSIS — D638 Anemia in other chronic diseases classified elsewhere: Secondary | ICD-10-CM

## 2024-07-13 LAB — BASIC METABOLIC PANEL WITH GFR
Anion gap: 11 (ref 5–15)
BUN: 41 mg/dL — ABNORMAL HIGH (ref 8–23)
CO2: 20 mmol/L — ABNORMAL LOW (ref 22–32)
Calcium: 9.7 mg/dL (ref 8.9–10.3)
Chloride: 108 mmol/L (ref 98–111)
Creatinine, Ser: 1.52 mg/dL — ABNORMAL HIGH (ref 0.61–1.24)
GFR, Estimated: 48 mL/min — ABNORMAL LOW (ref >60)
Glucose, Bld: 81 mg/dL (ref 70–99)
Potassium: 3.4 mmol/L — ABNORMAL LOW (ref 3.5–5.1)
Sodium: 139 mmol/L (ref 135–145)

## 2024-07-13 LAB — CBC
HCT: 35.7 % — ABNORMAL LOW (ref 39.0–52.0)
Hemoglobin: 11.5 g/dL — ABNORMAL LOW (ref 13.0–17.0)
MCH: 27.2 pg (ref 26.0–34.0)
MCHC: 32.2 g/dL (ref 30.0–36.0)
MCV: 84.4 fL (ref 80.0–100.0)
Platelets: 245 K/uL (ref 150–400)
RBC: 4.23 MIL/uL (ref 4.22–5.81)
RDW: 14.4 % (ref 11.5–15.5)
WBC: 10.3 K/uL (ref 4.0–10.5)
nRBC: 0 % (ref 0.0–0.2)

## 2024-07-13 LAB — C-REACTIVE PROTEIN: CRP: 0.9 mg/dL (ref <1.0)

## 2024-07-13 LAB — SEDIMENTATION RATE: Sed Rate: 50 mm/h — ABNORMAL HIGH (ref 0–16)

## 2024-07-13 LAB — VANCOMYCIN, RANDOM: Vancomycin Rm: 22 ug/mL

## 2024-07-13 MED ORDER — VANCOMYCIN HCL IN DEXTROSE 1-5 GM/200ML-% IV SOLN
1000.0000 mg | INTRAVENOUS | Status: DC
  Administered 2024-07-13: 1000 mg via INTRAVENOUS
  Filled 2024-07-13: qty 200

## 2024-07-13 MED ORDER — VANCOMYCIN HCL 1000 MG IV SOLR
1000.0000 mg | INTRAVENOUS | Status: DC
  Administered 2024-07-15: 1000 mg via INTRAVENOUS
  Filled 2024-07-13 (×2): qty 20
  Filled 2024-07-13: qty 1000

## 2024-07-13 NOTE — Progress Notes (Signed)
 Nurse called to see if Kristopher Thompson could have Zofran  since compazine  ineffective. Zofran  contraindicated due to history of long QT interval. She will explain the above to patient and his wife.

## 2024-07-13 NOTE — Progress Notes (Signed)
 Speech Language Pathology Daily Session Note  Patient Details  Name: Kristopher Thompson MRN: [id number] Date of Birth: [birth year]  Today's Date: 07/13/2024 SLP Individual Time: 1330-1430 SLP Individual Time Calculation (min): 60 min  Short Term Goals: Week 1: SLP Short Term Goal 1 (Week 1): Patient will tolerate Dys 3 diet textures with reduced reguritation episodes using safe swallow strategies. SLP Short Term Goal 2 (Week 1): Patient will recall safe swallow strategies (upright for PO, alternate solids/liquids, slow rate, small bites/sips) with 80% acc when provided min A. SLP Short Term Goal 3 (Week 1): Patient will use compensatory memory strategies to recall functional information in the current environment when provided mod A. SLP Short Term Goal 4 (Week 1): Patient will improve problem solving and planning skills in functional tasks when provided mod A.  Skilled Therapeutic Interventions:   Pt greeted at bedside for tx tasks targeting dysphagia and cognition. He was upright in bed eating his lunch upon SLP arrival. Pt reported that he couldn't take anymore bites or else he'd throw up. SLP facilitated continued education re potential benefit of MBSS to r/o any pharyngeal complications and assist w/ ensuring the safest diet. He, again, was originally not agreeable to [facility] but did eventually agree and consent to it w/ continued education. Plan for completion 9/3 or 9/4 pending scheduling. SLP also facilitated money management task via menu. He required only supervisonA for attention to details and problem solving. He then completed a medication management task ID errors and required minA cues for information processing, self monitoring, and problem solving. He did endorse noticing changes in his cognition when asked to rate his performance during the tasks. Will continue to target in upcoming tx sessions. At the end of tx tasks, he was left in bed w/ the alarm set and call light within reach.  Recommend cont ST per POC.  Pain  No pain reported  Therapy/Group: Individual Therapy  Kristopher Thompson 07/13/2024, 8:47 PM

## 2024-07-13 NOTE — Progress Notes (Signed)
 Physical Therapy Session Note  Patient Details  Name: Kristopher Thompson MRN: [id number] Date of Birth: 1951-10-05  Today's Date: 07/13/2024 PT Individual Time: 9151-9055 PT Individual Time Calculation (min): 56 min   Short Term Goals: Week 1:  PT Short Term Goal 1 (Week 1): pt will perform bed mobility with min A with LRAD PT Short Term Goal 2 (Week 1): pt will perform sit to stand with mod A or less PT Short Term Goal 3 (Week 1): pt will initiate gait training  Skilled Therapeutic Interventions/Progress Updates:     Pt received supine in bed and agrees to therapy. Pt presents with very flat affect but does not complain of any pain; only weakness at this time. Session begins with lower body dressing. Pt attempts briding with manual cueing from PT and stabilization of lower extremities. Pt able to perform slightly to assist with pulling up boxers and shorts. Bilateral rolling performed with modA and cues for hand placement and body mechanics. Supine to sit with maxA at trunk and lower extremities, with cues for initiation and logrolling technique. Pt requires modA +2 to stand from elevated bed and stand step to WC with maxA +2 and RW. Pt has significant difficulty clearing feet for steps during transfer and takes extremely short, shuffling steps. WC transport to gym. PT checks HR and pt noted to be resting at ~120 bpm. Cues provided for pursed lip breathing to optimize oxygen sats and decrease HR. Following extended seated rest break, pt stands with maxA +2 and is only able to maintain standing ~10 seconds prior to requiring rest break. Pt's HR increases to 150 bpm with mobility and takes several minutes to decrease to low 120s. RN and MD notified. WC transport back to room. Pt encouraged to remain sitting in WC to improve activity tolerance. Left seated with all needs within reach.   Therapy Documentation Precautions:  Precautions Precautions: Fall (HOH, Urostomy, Colostomy, PCN  drain, and  orthostatic hypotension.) Recall of Precautions/Restrictions: Impaired Precaution/Restrictions Comments: urostomy, colostomy, lt PCN drain, watch BP and HR Restrictions Weight Bearing Restrictions Per Provider Order: No   Therapy/Group: Individual Therapy  Elsie JAYSON Dawn, PT, DPT 07/13/2024, 3:52 PM

## 2024-07-13 NOTE — Progress Notes (Signed)
 Inpatient Rehabilitation Care Coordinator Assessment and Plan Patient Details  Name: Kristopher Thompson MRN: [id number] Date of Birth: 09/02/1951  Today's Date: 07/13/2024  Hospital Problems: Principal Problem:   Debility  Past Medical History:  Past Medical History:  Diagnosis Date   At risk for sleep apnea    12-25-2017   STOP-BANG SCORE= 5   --- SENT TO PCP   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   moderate atypia - right low back   Atypical nevus 04/04/2007   moderate to marked - right upper back (wider shave)   Atypical nevus 04/04/2007   moderate atypia - center chest (wider shave)   Atypical nevus 04/04/2007   slight atypia - right thigh   Atypical nevus 11/29/2011   mild atypia - center upper back   Atypical nevus 11/29/2011   mild atypia - center chest   Bacteremia due to Klebsiella pneumoniae 10/09/2017   Bladder cancer (HCC) dx 07/2017   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 denies   Sleep apnea    cpap   Squamous cell carcinoma of skin 05/22/2013   left cheek - CX3 + 5FU   Wears glasses    Past Surgical History:  Past Surgical History:  Procedure Laterality Date   ABDOMINAL SURGERY     APPENDECTOMY  1972   BOWEL RESECTION N/A 04/20/2024   Procedure: SMALL BOWEL RESECTION;  Surgeon: [name], MD;  Location: [facility];  Service: General;  Laterality: N/A;   CHOLECYSTECTOMY  1985   COLONOSCOPY N/A 06/29/2021   Procedure:  COLONOSCOPY;  Surgeon: Debby Hila, MD;  Location: [facility];  Service: Endoscopy;  Laterality: N/A;   COLOSTOMY REVERSAL N/A 01/08/2018   Procedure: COLOSTOMY REVERSAL;  Surgeon: Debby Hila, MD;  Location: [facility];  Service: General;  Laterality: N/A;   CYSTOSCOPY WITH RETROGRADE PYELOGRAM, URETEROSCOPY AND STENT PLACEMENT Right 06/10/2017   Procedure: CYSTOSCOPY WITH RIGHT URETEROSCOPY WITH RIGHT STENT PLACEMENT;  Surgeon: [name], MD;  Location: [facility];  Service: Urology;  Laterality: Right;   EUS N/A 04/16/2018   Procedure: FULL UPPER ENDOSCOPIC ULTRASOUND (EUS) RADIAL;  Surgeon: Burnette Fallow, MD;  [facility];  Service: Endoscopy;  Laterality: N/A;   FLEXIBLE SIGMOIDOSCOPY N/A 12/13/2017   Procedure: FLEXIBLE SIGMOIDOSCOPY;  Surgeon: Debby Hila, MD;  [facility];  Service: Endoscopy;  Laterality: N/A;   ILEO CONDUIT     IR CATHETER TUBE CHANGE  12/13/2017   IR CATHETER TUBE CHANGE  01/17/2018   IR CATHETER TUBE CHANGE  02/28/2018   IR CATHETER TUBE CHANGE  07/18/2018   IR CATHETER TUBE CHANGE  08/22/2018   IR CATHETER TUBE CHANGE  10/31/2018   IR CONVERT LEFT NEPHROSTOMY TO NEPHROURETERAL CATH  10/24/2017   IR EXT NEPHROURETERAL CATH EXCHANGE  05/19/2018   IR EXT NEPHROURETERAL CATH EXCHANGE  06/16/2018   IR EXT NEPHROURETERAL CATH EXCHANGE  07/09/2024   IR NEPHRO  TUBE REMOV/FL  10/24/2017   IR NEPHROSTOGRAM LEFT THRU EXISTING ACCESS  12/05/2018   IR NEPHROSTOMY EXCHANGE LEFT  05/11/2024   IR NEPHROSTOMY EXCHANGE LEFT  07/09/2024   IR NEPHROSTOMY PLACEMENT LEFT  10/07/2017   IR NEPHROSTOMY PLACEMENT RIGHT  05/04/2024   IR NEPHROSTOMY TUBE CHANGE  04/11/2018   IR URETERAL STENT PLACEMENT EXISTING ACCESS LEFT  05/13/2024   IR URETERAL STENT RIGHT NEW ACCESS W/SEP NEPHROSTOMY CATH  05/13/2024   LAPAROTOMY N/A 04/20/2024   Procedure: EXPLORATORY LAPAROTOMY;  Surgeon: [name], MD;  Location[facility];  Service: General;  Laterality: N/A;   PARTIAL COLECTOMY N/A  04/21/2024   Procedure: COLECTOMY, PARTIAL;  Surgeon: [name], MD;  Location: [facility];  Service: General;  Laterality: N/A;  Removal Wound Vac, Washout Ostomy, Possible Anastomosis, Possible Ileostomy.  Phasix Mesh.   POLYPECTOMY  06/29/2021   Procedure: POLYPECTOMY;  Surgeon: Debby Hila, MD;  [facility];  Service: Endoscopy;;   ROBOT ASSISTED LAPAROSCOPIC RADICAL PROSTATECTOMY  10/02/2012   Procedure: ROBOTIC ASSISTED LAPAROSCOPIC RADICAL PROSTATECTOMY LEVEL 2;  Surgeon: Noretta Ferrara, MD;  [facility];  Service: Urology;  Laterality: N/A;   ROBOTIC ASSISTED LAPAROSCOPIC BLADDER DIVERTICULECTOMY N/A 08/08/2017   Procedure: XI ROBOTIC ASSISTED LAPAROSCOPIC RADICAL CYSTECTOMY COVERTED TO OPEN PELVIC LYMPHADNECTOMY BILATERAL AND ILEAL CONDUIT URINARY DIVERSION;  Surgeon: Ferrara Glance, MD;  Location[facility];  Service: Urology;  Laterality: N/A;   TRANSURETHRAL RESECTION OF BLADDER TUMOR  06/10/2017   Procedure: TRANSURETHRAL RESECTION OF BLADDER TUMOR (TURBT);  Surgeon: Ferrara Glance, MD;  Location[facility];  Service: Urology;;   VACUUM ASSISTED CLOSURE CHANGE N/A 04/20/2024   Procedure: PLACEMENT OF CONCETTA CAPES;  Surgeon: [name], MD;  Location: [facility];  Service: General;  Laterality: N/A;   VENTRAL HERNIA REPAIR N/A 04/10/2024   Procedure: REPAIR, HERNIA, VENTRAL;  Surgeon: [name], MD;  Location[facility];  Service: General;  Laterality: N/A;   XI ROBOT ABDOMINAL PERINEAL RESECTION N/A 08/08/2017   Procedure: REPAIR OF RECTAL TEAR POSSIBLE PARTIAL PROCTECTOMY, CREATION OF  OSTOMY;  Surgeon: Debby Hila, MD;  Location[facility];  Service: General;  Laterality: N/A;   XI ROBOTIC ASSISTED PARASTOMAL HERNIA REPAIR N/A 04/10/2024   Procedure: REPAIR, HERNIA, PARASTOMAL AND VENTRAL HERNIAS, ROBOT-ASSISTED, LEFT INGUINAL HERNIA, LYSIS OF ADHESIONS INCARCERATED AND INSIONAL HERNIAS AND UROSTOMY REVISION;  Surgeon: [name], MD;  Location: [facility];  Service: General;   Laterality: N/A;   Social History:  reports that he quit smoking about 44 years ago. His smoking use included cigarettes. He started smoking about 53 years ago. He has never used smokeless tobacco. He reports current alcohol  use. He reports that he does not use drugs.  Family / Support Systems Marital Status: Married Patient Roles: Spouse, Parent Spouse/Significant Other: Marval [phone] Children: Grown children Other Supports: Friends Anticipated Caregiver: Wife Ability/Limitations of Caregiver: Wife can do min level of assist Caregiver Availability: 24/7 Family Dynamics: Close knit family pt is hopeful he is on the upswing and can start recovering from his health issues  Social History Preferred language: English Religion: Baptist Cultural Background: NA Education: DDS-dentist Health Literacy - How often do you need to have someone help you when you read instructions, pamphlets, or other written material from your doctor or pharmacy?: Never Writes: Yes Employment Status: Retired Marine scientist Issues: NA Guardian/Conservator: None-according to MD pt is not fully capable of making his own decisions. Will look toward his wife for any decisions while here-pt seems to be improving  Abuse/Neglect Abuse/Neglect Assessment Can Be Completed: Yes Physical Abuse: Denies Verbal Abuse: Denies Sexual Abuse: Denies Exploitation of patient/patient's resources: Denies Self-Neglect: Denies  Patient response to: Social Isolation - How often do you feel lonely or isolated from those around you?: Never  Emotional Status Pt's affect, behavior and adjustment status: Pt is motivated to recover and get back home it has been one thing after another since admission. He hopes to get stronger and move forward now. Wife is presnt and very supportive and involved Recent Psychosocial Issues: other health issues Psychiatric History: No hx due to long hospitalization and issues he has  dealt with would benefit from seeing neuro-psych while here Substance Abuse History: NA  Patient / Family Perceptions, Expectations & Goals Pt/Family understanding of illness & functional limitations: Pt and wife can explain his issues and medical procedures. Both speak with the MD's involved and wife asks questions. Pt reports at times he has not been fully with it. Seems alert and oriented times four today Premorbid pt/family roles/activities: husband, father, retiree, neighbor, friend, etc Anticipated changes in roles/activities/participation: resume Pt/family expectations/goals: Pt states:   I hope to recover and get back home.  Wife states:  I will do what he needs done to get him home.   Community Resources [name]: None Premorbid Home Care/DME Agencies: None Is the patient able to respond to transportation needs?: Yes In the past 12 months, has lack of transportation kept you from medical appointments or from getting medications?: No In the past 12 months, has lack of transportation kept you from meetings, work, or from getting things needed for daily living?: No Resource referrals recommended: Neuropsychology  Discharge Planning Living Arrangements: Spouse/significant other Support Systems: Spouse/significant other Type of Residence: Private residence Insurance Resources: Electrical engineer Resources: Restaurant manager, fast food Screen Referred: No Living Expenses: Banker Management: Patient Does the patient have any problems obtaining your medications?: No Care Coordinator Barriers to Discharge: Decreased caregiver support, Lack of/limited family support, IV antibiotics Care Coordinator Anticipated Follow Up Needs: HH/OP Expected length of stay: 10-14 days  Clinical Impression SW met with pt and pt wife in room to introduce self, explain role, discuss discharge process, and inform on ELOS. Pt wife confirms she is primary caregiver. No new changes since  last d/c from CIR on 06/19/24. Confirms home on IV abx at d/c. W will begin to work on home infusion company. SW will follow-up with updates.   SW left message for Pam/[facility] to follow-up about IV abx and waiting on follow-up.   *SW confirmed [facility] is actively following.   Jaidan Prevette A Erubiel Manasco 07/13/2024, 11:48 AM

## 2024-07-13 NOTE — Progress Notes (Signed)
 Occupational Therapy Session Note  Patient Details  Name: Kristopher Thompson MRN: [id number] Date of Birth: 03/28/1951  Today's Date: 07/13/2024 OT Individual Time: 8954-8841 OT Individual Time Calculation (min): 73 min    Short Term Goals: Week 1:  OT Short Term Goal 1 (Week 1): The pt will come from supine in bed to EOB with MinA  using AE as needed. OT Short Term Goal 2 (Week 1): The pt will complete UB/LB bathing at [facility] incorporating AE as needed. OT Short Term Goal 3 (Week 1): The pt will dress UB/LB with ModA incorporating AE as needed. OT Short Term Goal 4 (Week 1): The pt will improve activity tolerance to good 3 of 5 occurences. OT Short Term Goal 5 (Week 1): The pt will safely improve core strength to 3+/5 MMT.  Skilled Therapeutic Interventions/Progress Updates:    Pt resting in bed upon arrival. Skilled OT intervention with focus on BUE strengtheing with 2# bar-circuit exercises 3x10 reaching out and up with extended rest breaks. Pt slow to respond to questions and requests throughout session. Squat pivot transfer back to bed with max A+2. Tot A to reposition in bed. Pt remained in bed with all needs within reach. Bed alarm activated. Wife present.   Therapy Documentation Precautions:  Precautions Precautions: Fall (HOH, Urostomy, Colostomy, PCN  drain, and orthostatic hypotension.) Recall of Precautions/Restrictions: Impaired Precaution/Restrictions Comments: urostomy, colostomy, lt PCN drain, watch BP and HR Restrictions Weight Bearing Restrictions Per Provider Order: No Pain: Pt reports his buttocks start hurting when sitting in w/c; returned to bed at end of session.   Therapy/Group: Individual Therapy  [name] 07/13/2024, 12:09 PM

## 2024-07-13 NOTE — Progress Notes (Addendum)
 PROGRESS NOTE   Subjective/Complaints: Generally had a good night. Trying to work on breakfast this morning when I came in. Later with therapy developed significant tachycardia. (150) only recovered to 120's once rested.   ROS: Patient denies fever, rash, sore throat, blurred vision, dizziness, nausea, vomiting, diarrhea, cough,  chest pain, joint or back/neck pain, headache, or mood change.    Objective:   No results found. Recent Labs    07/12/24 0622 07/13/24 0546  WBC 11.9* 10.3  HGB 11.8* 11.5*  HCT 36.7* 35.7*  PLT 236 245   Recent Labs    07/12/24 0622 07/13/24 0546  NA 138 139  K 3.6 3.4*  CL 109 108  CO2 18* 20*  GLUCOSE 83 81  BUN 42* 41*  CREATININE 1.57* 1.52*  CALCIUM  9.8 9.7    Intake/Output Summary (Last 24 hours) at 07/13/2024 1135 Last data filed at 07/12/2024 1930 Gross per 24 hour  Intake 220 ml  Output 300 ml  Net -80 ml        Physical Exam: Vital Signs Blood pressure 108/71, pulse 79, temperature 97.6 F (36.4 C), temperature source Oral, resp. rate 17, height 5' 11 (1.803 m), weight 87.5 kg, SpO2 99%.  General: Alert and oriented x 3, No apparent distress HEENT: Head is normocephalic, atraumatic, PERRLA, EOMI, sclera anicteric, oral mucosa pink and moist, dentition intact, ext ear canals clear,  Neck: Supple without JVD or lymphadenopathy Heart: Reg rate and rhythm. No murmurs rubs or gallops Chest: CTA bilaterally without wheezes, rales, or rhonchi; no distress Abdomen: Soft,  tender, non-distended, bowel sounds positive. Ostomy sealed Extremities: No clubbing, cyanosis, or edema. Pulses are 2+ Psych: Pt's affect is appropriate. Pt is cooperative Skin: abdominal wound pinkn with granulation tissue.  Neuro:  Alert and oriented x 3. Normal insight and awareness. Intact Memory. Normal language and speech. Cranial nerve exam unremarkable. MMT: BUE 4/5 prox to distal. BLE 2/5 HF, 3-KE  and 4/5 ADF/PF. Sensory exam normal for light touch and pain in all 4 limbs. No limb ataxia or cerebellar signs. No abnormal tone appreciated.  .   Musculoskeletal: both knees tight in about 20 degrees flexion contractures    Assessment/Plan: 1. Functional deficits which require 3+ hours per day of interdisciplinary therapy in a comprehensive inpatient rehab setting. Physiatrist is providing close team supervision and 24 hour management of active medical problems listed below. Physiatrist and rehab team continue to assess barriers to discharge/monitor patient progress toward functional and medical goals  Care Tool:  Bathing    Body parts bathed by patient: Chest, Face, Abdomen, Right arm, Left arm   Body parts bathed by helper: Left upper leg, Right lower leg, Right upper leg, Buttocks, Front perineal area, Left lower leg     Bathing assist Assist Level: Moderate Assistance - Patient 50 - 74% (Mod/MaxA)     Upper Body Dressing/Undressing Upper body dressing   What is the patient wearing?: Pull over shirt    Upper body assist Assist Level: Maximal Assistance - Patient 25 - 49%    Lower Body Dressing/Undressing Lower body dressing      What is the patient wearing?: Pants, Incontinence brief  Lower body assist Assist for lower body dressing: Maximal Assistance - Patient 25 - 49% (MaxA /Dep)     Toileting Toileting    Toileting assist Assist for toileting:  (patient has colosteomy and urostomy)     Transfers Chair/bed transfer  Transfers assist  Chair/bed transfer activity did not occur: Safety/medical concerns        Locomotion Ambulation   Ambulation assist   Ambulation activity did not occur: Safety/medical concerns (fatigue/dizziness/orthostatics)          Walk 10 feet activity   Assist  Walk 10 feet activity did not occur: Safety/medical concerns (fatigue/dizziness/orthostatics)        Walk 50 feet activity   Assist Walk 50 feet with 2  turns activity did not occur: Safety/medical concerns (fatigue/dizziness/orthostatics)         Walk 150 feet activity   Assist Walk 150 feet activity did not occur: Safety/medical concerns (fatigue/dizziness/orthostatics)         Walk 10 feet on uneven surface  activity   Assist Walk 10 feet on uneven surfaces activity did not occur: Safety/medical concerns (fatigue/dizziness/orthostatics)         Wheelchair     Assist Is the patient using a wheelchair?: No             Wheelchair 50 feet with 2 turns activity    Assist    Wheelchair 50 feet with 2 turns activity did not occur: Safety/medical concerns       Wheelchair 150 feet activity     Assist  Wheelchair 150 feet activity did not occur: Safety/medical concerns       Blood pressure 108/71, pulse 79, temperature 97.6 F (36.4 C), temperature source Oral, resp. rate 17, height 5' 11 (1.803 m), weight 87.5 kg, SpO2 99%. Medical Problem List and Plan: 1. 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  H/o PE/Antithrombotics: -DVT/anticoagulation:  Pharmaceutical: Eliquis              -antiplatelet therapy: N/A 3. Pain Management: Oxycodone  prn.  4. Mood/Behavior/Sleep: LCSW to follow for evaluation and support.              --melatonin for insomnia.              -antipsychotic agents: N/A 5. Neuropsych/cognition: This patient is not capable of making decisions on his own behalf. 6. Skin/Wound Care: Routine pressure relief measures. Encourage adequate diet             --dressing changes daily. Gerhardt's cream to buttock wound.  7. Fluids/Electrolytes/Nutrition: Monitor I/O. Check CMET in am. Periactin  for appetite             --continue juven bid. Has been refusing Ensure supplement.   -kdur 20meq daily for  potassium of 3.4 8. Sepsis/Abdominal wall/mesh infection: IV Vanc/Ceftriaxone  for 6 weeks from 08/21             --Probable 6 moths total of antibiotics--ID follow up 09/18 @10  am --Corynebacterium species-->6 more weeks Dalbavancin? V/s tedezolid/linezolid . Ecoli-->ceftriaxone  to Omadocycline pending susceptibility.  9. Left> right hydronephrosis: Nephrostomy tubes with stent exchanged on 08/27 --continue clamping right tube and left to drain per urology.  10. Acute on chronic renal failure: Improved  but slowly trending up with SCr 1.48 --continue to monitor 11. OSA: Encourage CPAP use 12. Hypotension: Continue Midodrine  15 mg TID and Florinef  2 mg daily             --continues to have significant tachycardia with activity likely due to orthostasis and deconditioning.  -9/1 will need to continue working on acclimation   -volume/renal seem to be about where he's been at current baseline   -hgb 11.5 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    9/1 hold reglan  14. Depression: On Zoloft  15. Malnutrition: Albumin  @1 .9/TP-5.1. Recheck Phos/Mg levels.  16. Anemia of chronic illness: Stable overall.   17.  Coughing with fluids  await SLP eval       LOS: 2 days A FACE TO FACE EVALUATION WAS PERFORMED  Arthea ONEIDA Gunther 07/13/2024, 11:35 AM

## 2024-07-13 NOTE — Care Management (Signed)
 [facility] Individual Statement of Services  Patient Name:  Kristopher Thompson  Date:  07/13/2024  Welcome to the [facility].  Our goal is to provide you with an individualized program based on your diagnosis and situation, designed to meet your specific needs.  With this comprehensive rehabilitation program, you will be expected to participate in at least 3 hours of rehabilitation therapies Monday-Friday, with modified therapy programming on the weekends.  Your rehabilitation program will include the following services:  Physical Therapy (PT), Occupational Therapy (OT), 24 hour per day rehabilitation nursing, Therapeutic Recreaction (TR), Psychology, Neuropsychology, Care Coordinator, Rehabilitation Medicine, Nutrition Services, Pharmacy Services, and Other  Weekly team conferences will be held on Tuesday to discuss your progress.  Your Inpatient Rehabilitation Care Coordinator will talk with you frequently to get your input and to update you on team discussions.  Team conferences with you and your family in attendance may also be held.  Expected length of stay: 10-14 days    Overall anticipated outcome: Supervision  Depending on your progress and recovery, your program may change. Your Inpatient Rehabilitation Care Coordinator will coordinate services and will keep you informed of any changes. Your Inpatient Rehabilitation Care Coordinator's name and contact numbers are listed  below.  The following services may also be recommended but are not provided by the Inpatient [facility]:  Driving Evaluations Home Health Rehabiltiation Services Outpatient Rehabilitation Services Vocational Rehabilitation   Arrangements will be made to provide these services after discharge if needed.  Arrangements include referral to agencies that provide these services.  Your insurance has been verified to be:  Medicare A/B  Your primary doctor is:  Tanda Rummer  Pertinent information will be shared with your doctor and your insurance company.  Inpatient Rehabilitation Care Coordinator:  Graeme Jude, KEN [phone] or ([phone]  Information discussed with and copy given to patient by: [name], 07/13/2024, 10:07 AM

## 2024-07-13 NOTE — Progress Notes (Signed)
 Pharmacy Antibiotic Note  Kristopher Thompson is a 73 y.o. male admitted on 07/11/2024 with intra- abdominal wall infection.  Pharmacy has been consulted for vancomycin  dosing (he is also on Rocephin ).  Renal function is stable with SCr ~1.4-1.5s. Afeb, WBC down to normal.  -vancomycin  random= 22 on 9/1 -vancomycin  random= 23 on 8/30 -Initial vancomycin  level was 52 and this was likely the effect of accumulation -ID also following: continuing antibiotics until 10/2  Plan: -Decrease vancomycin  1000 mg IV q48h    - goal VT 15-20 mcg/ml or AUC 400-550 -Will follow renal function and clinical progress -Check vancomycin  levels as clinically indicated   Height: 5' 11 (180.3 cm) Weight: 87.5 kg (192 lb 14.4 oz) IBW/kg (Calculated) : 75.3  Temp (24hrs), Avg:97.9 F (36.6 C), Min:97.8 F (36.6 C), Max:98 F (36.7 C)  Recent Labs  Lab 07/07/24 0323 07/08/24 0246 07/08/24 1749 07/08/24 2056 07/09/24 0252 07/09/24 1557 07/11/24 0300 07/12/24 0622 07/13/24 0546  WBC 9.1 8.7  --   --   --   --   --  11.9* 10.3  CREATININE 1.42* 1.45*  --   --  1.48*  --   --  1.57* 1.52*  VANCOTROUGH  --   --   --   --   --  39*  --   --   --   VANCOPEAK  --   --  >60*  --   --   --   --   --   --   VANCORANDOM  --   --   --    < >  --   --  23  --  22   < > = values in this interval not displayed.    Estimated Creatinine Clearance: 46.8 mL/min (A) (by C-G formula based on SCr of 1.52 mg/dL (H)).    Allergies  Allergen Reactions   Demerol  [Meperidine ] Nausea And Vomiting    Antimicrobials this admission: Vancomycin  8/21 >  Cefepime  8/20>8/25 Ceftrix per ID 8/25>  Dose adjustments this admission: 8/22 VR 18 at 0835 - ~40 hrs after LD but AKI > improving > maint dose begun 1gm q24h 8/26: SCr improved to 1.37>>1gm > 1250 mg IV q24h for eAUC 406 8/27 VP  52 - vancomycin  held 8/28 VT 39 -  8/30: VR 23 - 1250 mg IV q48h 9/1: VR 22 - decr 1000 mg IV q24h  Microbiology results: 8/20 blood:  neg 8/20 urine: >100K/ml MRSA (MIC to Vanc 1; also sens TCN and linezolid  but R to cipro  and septra ) + >100K/ml Enterococcus faecium (MIC to Vanc < 0.5) 8/21 JP drainage (RLQ): abundant E coli (send Cefepime , Erta, CRO, Mero, Zosyn ) and Corynebacterium  Thank you for involving pharmacy in this patient's care.  Delon Sax, PharmD, BCPS Clinical Pharmacist Clinical phone for 07/13/2024 is [phone] 07/13/2024 8:27 AM

## 2024-07-13 NOTE — Plan of Care (Signed)
  Problem: RH BOWEL ELIMINATION Goal: RH STG MANAGE BOWEL WITH ASSISTANCE Description: STG Manage Bowel with supervision Assistance. Outcome: Progressing   Problem: RH BLADDER ELIMINATION Goal: RH STG MANAGE BLADDER WITH ASSISTANCE Description: STG Manage Bladder With supervision Assistance Outcome: Progressing   Problem: RH SKIN INTEGRITY Goal: RH STG SKIN FREE OF INFECTION/BREAKDOWN Description: Manage skin free of infection with supervision assistance Outcome: Progressing   Problem: RH SAFETY Goal: RH STG ADHERE TO SAFETY PRECAUTIONS W/ASSISTANCE/DEVICE Description: STG Adhere to Safety Precautions With Assistance/Device. Outcome: Progressing   Problem: RH PAIN MANAGEMENT Goal: RH STG PAIN MANAGED AT OR BELOW PT'S PAIN GOAL Description: <4 w/ prns Outcome: Progressing

## 2024-07-13 NOTE — Progress Notes (Signed)
 Patient refused CPAP for the night

## 2024-07-14 LAB — MINIMUM INHIBITORY CONC. (1 DRUG)

## 2024-07-14 LAB — MIC RESULT

## 2024-07-14 MED ORDER — METOPROLOL TARTRATE 12.5 MG HALF TABLET
12.5000 mg | ORAL_TABLET | Freq: Two times a day (BID) | ORAL | Status: DC
  Administered 2024-07-14 – 2024-07-25 (×20): 12.5 mg via ORAL
  Filled 2024-07-14 (×22): qty 1

## 2024-07-14 NOTE — Patient Care Conference (Signed)
 Inpatient RehabilitationTeam Conference and Plan of Care Update Date: 07/14/2024   Time: 1018 am    Patient Name: Kristopher Thompson      Medical Record Number: [id number]  Date of Birth: [birth year] Sex: Male         Room/Bed: 4W26C/4W26C-01 Payor Info: Payor: MEDICARE / Plan: MEDICARE PART A AND B / Product Type: *No Product type* /    Admit Date/Time:  07/11/2024 12:40 PM  Primary Diagnosis:  Critical illness myopathy  Hospital Problems: Principal Problem:   Critical illness myopathy    Expected Discharge Date: Expected Discharge Date:  (TBD)  Team Members Present: Physician leading conference: Dr. Arthea Gunther Social Worker Present: Graeme Jude, LCSW Nurse Present: Eulalio Falls, RN PT Present: Kirt Dawn, PT OT Present: [name], OT SLP Present: Recardo Mole, SLP PPS Coordinator present : Eleanor Colon, SLP     Current Status/Progress Goal Weekly Team Focus  Bowel/Bladder      Ileostomy Urostomy Bilateral nephrostomy tubes     Promote independence in pouch emptying and changing    Assess tubes q shift  Swallow/Nutrition/ Hydration   Dys 3 textures/thin liquids   modI  MBSS, diet tolerance, introduction of compensatory strategies    ADL's   Mod A for UB care; Max-Total A for LB care   Mod I; potential for downgrade   Functional transfers, general conditioning, and activity tolerance.    Mobility   (P) maxA bed mobility and transfers, gait not yet initiated due to weakness and tachycardia   (P) Supervision  (P) increased time out of bed,    Communication                Safety/Cognition/ Behavioral Observations  mild cognitive deficits - attention, information processing, awareness, problem solving   supervisionA   pt/family education, cognitive retraing, functional cognitive tasks    Pain      Back pain    <4 w/ prns   Assess pain q shift   Skin      Mid lower abdomen right incision/wound - pack wet to dry Right buttom  DTI- cleanse w/ water  ; pat dry apply foam; turn q2 and pressure redistribution chair   Skin free of infection entire stay on rehab    Assess skin q shift    Discharge Planning:  D/c to home with wife who is primary caregiver. Pt will d/c on IVabx until 10/2. [facility] in place from previous admission. SW will confirm there are no barriers to discharge.    Team Discussion: Patient was admitted post debility due to critical illness myopathy/ sepsis. Patient with tachycardia/ hypotension/ high volume on ileostomy/pain /nausea: medication adjusted by MD. Patient progress limited by severe deconditioning , poor activity tolerance, poor po intake, self limiting behaviors,  orthostasis, coughing with liquids, mild cognitive deficits with attention, information processing and awareness.   Patient on target to meet rehab goals: Currently patient needs mod assistance with upper body care and needs max-total assistance with lower body care. Patient needs max assistance with transfers. Ambulation not yet initiated due to severe weakness. Overall goals at discharge are set for supervision assistance.  *See Care Plan and progress notes for long and short-term goals.   Revisions to Treatment Plan:   Dysphagia 3/ thin liquids Swallow Study Downgraded goals  Teaching Needs: Safety, medications, transfers, toileting, etc   Current Barriers to Discharge: Decreased caregiver support, Home enviroment access/layout, and IV antibiotics  Possible Resolutions to Barriers: Family Education Home Health follow up  [facility]     Medical Summary Current Status: debility after abdominal abscess prolonged hospital course. orthostatic hypotension and tachycardia, ongoing wound issues, nausea with po intake, poor po, ostomies in place  Barriers to Discharge: Inadequate Nutritional Intake;Medical stability   Possible Resolutions to Barriers/Weekly Focus: continued acclimation, add low dose beta blocker.  wound care, adjusting nausea medication/increase po intake   Continued Need for Acute Rehabilitation Level of Care: The patient requires daily medical management by a physician with specialized training in physical medicine and rehabilitation for the following reasons: Direction of a multidisciplinary physical rehabilitation program to maximize functional independence : Yes Medical management of patient stability for increased activity during participation in an intensive rehabilitation regime.: Yes Analysis of laboratory values and/or radiology reports with any subsequent need for medication adjustment and/or medical intervention. : Yes   I attest that I was present, lead the team conference, and concur with the assessment and plan of the team.   Xzavion Doswell Gayo 07/15/2024, 1018 am

## 2024-07-14 NOTE — Consult Note (Addendum)
 WOC Nurse Consult Note: Reason for Consult: Requested to assess a DTI on R buttock. Wound type: Deep pressure injury bilateral buttocks. Pressure Injury POA: Yes Measurement: 2.5 x 6 cm x 0.1 cm (see flowsheet) Wound bed: No skin breakdown. Recent scar on R buttock. Redness and dark skin bilateral. Drainage (amount, consistency, odor) NONE Periwound: Intact. Dressing procedure/placement/frequency: Cleanse with warm water , pat dry the skin. Apply Foam dressing, change every 3 days or PRN. Note:  - Turn and repositioning per hospital policy - If the pt is able to go in the chair, add a chair pressure retribution pad.  WOC team will not plan to follow further. Please reconsult if further assistance is needed. Thank-you,  Lela Holm RN, CNS, [employer], MSN.  (Phone [phone])  .

## 2024-07-14 NOTE — Progress Notes (Signed)
 Patient ID: Kristopher Thompson, male   DOB: 12/17/1950, 73 y.o.   MRN: [id number]  SW confirmed with [facility] able to accept referral.   SW met with pt and pt wife in room to provide updates from team conference, and d/c date TBD at this time due to various medical issues. SW informed on [facility] and [facility]. SW will provide updates as available.   Graeme Jude, MSW, LCSW Office: [phone] Cell: [phone] Fax: [phone]

## 2024-07-14 NOTE — Progress Notes (Addendum)
 PROGRESS NOTE   Subjective/Complaints: Still some nausea with eating. HR elevated with activity. Very weak still. Able to sleep  ROS: Patient denies fever, rash, sore throat, blurred vision, dizziness, nausea, vomiting, diarrhea, cough,  or chest pain, joint or back/neck pain, headache, or mood change.    Objective:   No results found. Recent Labs    07/12/24 0622 07/13/24 0546  WBC 11.9* 10.3  HGB 11.8* 11.5*  HCT 36.7* 35.7*  PLT 236 245   Recent Labs    07/12/24 0622 07/13/24 0546  NA 138 139  K 3.6 3.4*  CL 109 108  CO2 18* 20*  GLUCOSE 83 81  BUN 42* 41*  CREATININE 1.57* 1.52*  CALCIUM  9.8 9.7    Intake/Output Summary (Last 24 hours) at 07/14/2024 1119 Last data filed at 07/14/2024 0931 Gross per 24 hour  Intake 560 ml  Output 2050 ml  Net -1490 ml        Physical Exam: Vital Signs Blood pressure 124/76, pulse 92, temperature 97.8 F (36.6 C), temperature source Oral, resp. rate 16, height 5' 11 (1.803 m), weight 87.5 kg, SpO2 99%.  Constitutional: No distress . Vital signs reviewed. HEENT: NCAT, EOMI, oral membranes moist Neck: supple Cardiovascular: tachy without murmur. No JVD    Respiratory/Chest: CTA Bilaterally without wheezes or rales. Normal effort    GI/Abdomen: BS +, non-tender, non-distended, ostomy sealed Ext: no clubbing, cyanosis, or edema Psych: pleasant and cooperative  Psych: Pt's affect is appropriate. Pt is cooperative Skin: abdominal wound pink with granulation tissue. Uro: ostomy output yellow/clear  Neuro:  Alert and oriented x 3. Normal insight and awareness. Intact Memory. Normal language and speech. Cranial nerve exam unremarkable. MMT: BUE 4/5 prox to distal. BLE 2/5 HF, 3-KE and 4/5 ADF/PF. Sensory exam normal for light touch and pain in all 4 limbs. No limb ataxia or cerebellar signs. No abnormal tone appreciated.  .  Prior neuro assessment is c/w 07/14/2024 exam.   Musculoskeletal: both knees tight in about 20 degrees flexion contractures    Assessment/Plan: 1. Functional deficits which require 3+ hours per day of interdisciplinary therapy in a comprehensive inpatient rehab setting. Physiatrist is providing close team supervision and 24 hour management of active medical problems listed below. Physiatrist and rehab team continue to assess barriers to discharge/monitor patient progress toward functional and medical goals  Care Tool:  Bathing    Body parts bathed by patient: Chest, Face, Abdomen, Right arm, Left arm   Body parts bathed by helper: Left upper leg, Right lower leg, Right upper leg, Buttocks, Front perineal area, Left lower leg     Bathing assist Assist Level: Moderate Assistance - Patient 50 - 74% (Mod/MaxA)     Upper Body Dressing/Undressing Upper body dressing   What is the patient wearing?: Pull over shirt    Upper body assist Assist Level: Maximal Assistance - Patient 25 - 49%    Lower Body Dressing/Undressing Lower body dressing      What is the patient wearing?: Pants, Incontinence brief     Lower body assist Assist for lower body dressing: Maximal Assistance - Patient 25 - 49% (MaxA /Dep)     Toileting Toileting  Toileting assist Assist for toileting:  (patient has colosteomy and urostomy)     Transfers Chair/bed transfer  Transfers assist  Chair/bed transfer activity did not occur: Safety/medical concerns        Locomotion Ambulation   Ambulation assist   Ambulation activity did not occur: Safety/medical concerns (fatigue/dizziness/orthostatics)          Walk 10 feet activity   Assist  Walk 10 feet activity did not occur: Safety/medical concerns (fatigue/dizziness/orthostatics)        Walk 50 feet activity   Assist Walk 50 feet with 2 turns activity did not occur: Safety/medical concerns (fatigue/dizziness/orthostatics)         Walk 150 feet activity   Assist Walk 150  feet activity did not occur: Safety/medical concerns (fatigue/dizziness/orthostatics)         Walk 10 feet on uneven surface  activity   Assist Walk 10 feet on uneven surfaces activity did not occur: Safety/medical concerns (fatigue/dizziness/orthostatics)         Wheelchair     Assist Is the patient using a wheelchair?: Yes Type of Wheelchair: Manual           Wheelchair 50 feet with 2 turns activity    Assist    Wheelchair 50 feet with 2 turns activity did not occur: Safety/medical concerns       Wheelchair 150 feet activity     Assist  Wheelchair 150 feet activity did not occur: Safety/medical concerns       Blood pressure 124/76, pulse 92, temperature 97.8 F (36.6 C), temperature source Oral, resp. rate 16, height 5' 11 (1.803 m), weight 87.5 kg, SpO2 99%. Medical Problem List and Plan: 1. 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  H/o PE/Antithrombotics: -DVT/anticoagulation:  Pharmaceutical: Eliquis              -antiplatelet therapy: N/A 3. Pain Management: Oxycodone  prn.  4. Mood/Behavior/Sleep: LCSW to follow for evaluation and support.              --melatonin for insomnia.              -antipsychotic agents: N/A 5. Neuropsych/cognition: This patient is not capable of making decisions on his own behalf. 6. Skin/Wound Care: Routine pressure relief measures. Encourage adequate diet             --dressing changes daily. Gerhardt's cream to buttock wound.  7. Fluids/Electrolytes/Nutrition: Monitor I/O. Check CMET in am. Periactin  for appetite             --continue juven bid. Has been refusing Ensure supplement.   -kdur 20meq daily for potassium of 3.4  -can't use zofran  d/t QTc prolongation (490) 8. Sepsis/Abdominal wall/mesh infection: IV Vanc/Ceftriaxone  for 6 weeks  from 08/21             --Probable 6 moths total of antibiotics--ID follow up 09/18 @10  am --Corynebacterium species-->6 more weeks Dalbavancin? V/s tedezolid/linezolid . Ecoli-->ceftriaxone  to Omadocycline pending susceptibility.  -ESR 50, CRP 0.9 9. Left> right hydronephrosis: Nephrostomy tubes with stent exchanged on 08/27 --continue clamping right tube and left to drain per urology.  10. Acute on chronic renal failure: Improved but slowly trending up with SCr 1.48 --continue to monitor 11. OSA: Encourage CPAP use 12.  Hypotension: Continue Midodrine  15 mg TID and Florinef  2 mg daily             --continues to have significant tachycardia with activity likely due to orthostasis and deconditioning.  -9/2 will need to continue working on acclimation   -volume/renal   at current baseline   -hgb 11.5   -add metoprolol  low dose 12.5mg  bid   -encourage PO 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    9/1 held reglan  14. Depression: On Zoloft  15. Malnutrition: Albumin  @1 .9/TP-5.1. Recheck Phos/Mg levels.  16. Anemia of chronic illness: Stable overall.   17.  Coughing with fluids  await SLP eval       LOS: 3 days A FACE TO FACE EVALUATION WAS PERFORMED  Arthea ONEIDA Gunther 07/14/2024, 11:19 AM

## 2024-07-14 NOTE — Progress Notes (Signed)
 Inpatient Rehabilitation  Patient information reviewed and entered into eRehab system by [facility]. Karen Kays., CCC/SLP, PPS Coordinator.  Information including medical coding, functional ability and quality indicators will be reviewed and updated through discharge.

## 2024-07-14 NOTE — Progress Notes (Signed)
 Physical Therapy Session Note  Patient Details  Name: LISLE SKILLMAN MRN: [id number] Date of Birth: 1951-08-03  Today's Date: 07/14/2024 PT Individual Time: 9064-9040 PT Individual Time Calculation (min): 24 min   Today's Date: 07/14/2024 PT Individual Time: 8694-8654 PT Individual Time Calculation (min): 40 min   Short Term Goals: Week 1:  PT Short Term Goal 1 (Week 1): pt will perform bed mobility with min A with LRAD PT Short Term Goal 2 (Week 1): pt will perform sit to stand with mod A or less PT Short Term Goal 3 (Week 1): pt will initiate gait training  Skilled Therapeutic Interventions/Progress Updates:     1st Session: Pt received semi reclined in bed and agrees to therapy. No complaint of pain. Supine to sit with significantly increased time required for pt to perform as much as possible on his own, and eventually requiring maxA for completion, with cues for hand placement and body mechanics. Pt attempts seated scooting at EOB but unable to functionally clear either hip, requiring maxA to scoot toward EOB. Pt attempts sit to stand but unable to complete full stand, returning to seated position on bed. Pt then completes squat pivot from bed to WC on Lt with maxA and cues for initiation, body mechanics, and sequencing. Pt's HR noted to be at 125 bpm and BP 127/76. Pt left seated in WC with alarm intact and all needs within reach.   2nd Session: Pt received semi reclined in bed and agrees to therapy. No complaint of pain. PT provides pt with ROHO cushion for WC for pressure relief. Pt performs supine to sit with maxA and cues for sequencing and positioning. Pt performs stand pivot transfer from bed to [facility] with maxA and facilitation of anterior weight shifting, upright posture and stability, lateral weight shifting, and hip position for safety. WC transport to gym. Pt completes kinetron for BLE strengthening, performing at resistance of 70cm/sec with PT providing cues for foot placement,  body mechanics, and correct performance. Pt completes x12:00 with several rest breaks. WC transport back to room. Pt encouraged to remain sitting to work on activity tolerance and endurance. Left with all needs within reach.   Therapy Documentation Precautions:  Precautions Precautions: Fall (HOH, Urostomy, Colostomy, PCN  drain, and orthostatic hypotension.) Recall of Precautions/Restrictions: Impaired Precaution/Restrictions Comments: urostomy, colostomy, lt PCN drain, watch BP and HR Restrictions Weight Bearing Restrictions Per Provider Order: No   Therapy/Group: Individual Therapy  Elsie JAYSON Dawn, PT, DPT 07/14/2024, 4:10 PM

## 2024-07-14 NOTE — Progress Notes (Signed)
 Speech Language Pathology Daily Session Note  Patient Details  Name: Kristopher Thompson MRN: [id number] Date of Birth: 08/19/1951  Today's Date: 07/14/2024 SLP Individual Time: 0800-0900 SLP Individual Time Calculation (min): 60 min  Short Term Goals: Week 1: SLP Short Term Goal 1 (Week 1): Patient will tolerate Dys 3 diet textures with reduced reguritation episodes using safe swallow strategies. SLP Short Term Goal 2 (Week 1): Patient will recall safe swallow strategies (upright for PO, alternate solids/liquids, slow rate, small bites/sips) with 80% acc when provided min A. SLP Short Term Goal 3 (Week 1): Patient will use compensatory memory strategies to recall functional information in the current environment when provided mod A. SLP Short Term Goal 4 (Week 1): Patient will improve problem solving and planning skills in functional tasks when provided mod A.  Skilled Therapeutic Interventions:   SLP conducted skilled therapy session targeting cognition goals. Clinician conducted three organizational tasks to target executive functioning, problem solving, and sustained attention. Patient required extended processing time to complete each activity.Given additional processing time, improved attention to detail noted during 2/3 tasks. He independently completed both organization of functional information tasks with 100% accuracy. For the final activity, the patient decoded shapes to solve math equations. Patient independently utilized self correction skills to fix two mistakes and accurately answered 17/18 trials. After completing math activity, he benefited from clinician cues to review answers. Of note, patient exhibited difficulty with writing due to tremors. Pt and his wife reported reduced tremors w/ medication change, however, still apparent overall. Did discuss MBSS w/ pt again and provide additional education re the procedure. He remains in agreement to complete. Scheduled for 07/15/24.   Patient  was left in room with call bell in reach and alarm set. SLP will continue to target goals per plan of care.      Pain Pain Assessment Pain Scale: 0-10 Pain Score: 0-No pain  Therapy/Group: Individual Therapy  Kristopher Thompson 07/14/2024, 11:26 AM

## 2024-07-14 NOTE — Progress Notes (Signed)
 Occupational Therapy Session Note  Patient Details  Name: EMIT KUENZEL MRN: [id number] Date of Birth: [birth year]  Today's Date: 07/14/2024 OT Individual Time: 8975-8882 OT Individual Time Calculation (min): 53 min    Short Term Goals: Week 1:  OT Short Term Goal 1 (Week 1): The pt will come from supine in bed to EOB with MinA  using AE as needed. OT Short Term Goal 2 (Week 1): The pt will complete UB/LB bathing at [facility] incorporating AE as needed. OT Short Term Goal 3 (Week 1): The pt will dress UB/LB with ModA incorporating AE as needed. OT Short Term Goal 4 (Week 1): The pt will improve activity tolerance to good 3 of 5 occurences. OT Short Term Goal 5 (Week 1): The pt will safely improve core strength to 3+/5 MMT.  Skilled Therapeutic Interventions/Progress Updates:     Pt received siting up in wc, dressed for the day with all ADL needs met. Pt presenting to be uncomfortable in his wc, receptive to skilled OT session reporting pain in his buttocks without numerical value given- OT offering intermittent rest breaks, repositioning, and therapeutic support to optimize participation in therapy session. Focused this session on Pt education, activity tolerance, general strengthening, and body awareness for improved independence in ADL and transfers.   Pt Education/Therapeutic Activity:  -Pressure relief: Education provided on pressure relief methods and schedule to prevent skin breakdown. Engaged Pt in completing partial w/c push-ups to offload buttocks, however he demonstrated increased challenges fully lifting buttocks d/t B UE weakness. He was able to partially offload opposite hip by completing lateral leans with MIN/MOD A and verbal cues for technique.  -5 finger Breathing: Pt noted to become anxious following stand in STEDY during session. Education Pt on PLB'ing and 5-finger breathing technique to decrease respiratory rate and manage physical activity related anxiety. Pt required  demonstration to complete deep breathing technique and reported he found it helpful.   -Utilized STEDY to work on sit<>stand technique and standing tolerance. Pt able to complete single sit<>stand in STEDY with MAX A +2 MOD A to power up to standing position and tolerate standing ~20 sec with B UE support MAX A +2 MIN A. Prolonged seated rest break following. Pt maintained perched sitting position in STEDY with MIN A. EOB > supine MAX +2.   Therapeutic Exercise: -LB There-ex completed while seated in wc. Pt completed 2x10 reps of the following exercises with OT providing verbal/tactile cues for technique and rest breaks provided between each exercise:   -Knee flex/extension  -Alternating step taps to cone  -Dorsi/plant flexion  -Ankle rolls clockwise/counter clockwise -UB there-ex completed while supine in bed. Pt completed 2x10 reps of the following exercises using 2# weighted dowel with OT providing verbal/tactile cues for technique and rest breaks provided between each exercise:  -Bicep curls -Tricep extension -Shoulder flexion -Chest press   Pt was left resting in bed with call bell in reach, bed alarm on, and all needs met.    Therapy Documentation Precautions:  Precautions Precautions: Fall (HOH, Urostomy, Colostomy, PCN  drain, and orthostatic hypotension.) Recall of Precautions/Restrictions: Impaired Precaution/Restrictions Comments: urostomy, colostomy, lt PCN drain, watch BP and HR Restrictions Weight Bearing Restrictions Per Provider Order: No   Therapy/Group: Individual Therapy  Katheryn SHAUNNA [place] 07/14/2024, 7:31 AM

## 2024-07-14 NOTE — Plan of Care (Signed)
  Problem: Consults Goal: RH GENERAL PATIENT EDUCATION Description: See Patient Education module for education specifics. Outcome: Progressing   Problem: RH BOWEL ELIMINATION Goal: RH STG MANAGE BOWEL WITH ASSISTANCE Description: STG Manage Bowel with supervision Assistance. Outcome: Progressing   Problem: RH BLADDER ELIMINATION Goal: RH STG MANAGE BLADDER WITH ASSISTANCE Description: STG Manage Bladder With supervision Assistance Outcome: Progressing   Problem: RH SKIN INTEGRITY Goal: RH STG SKIN FREE OF INFECTION/BREAKDOWN Description: Manage skin free of infection with supervision assistance Outcome: Progressing   Problem: RH SAFETY Goal: RH STG ADHERE TO SAFETY PRECAUTIONS W/ASSISTANCE/DEVICE Description: STG Adhere to Safety Precautions With Assistance/Device. Outcome: Progressing   Problem: RH PAIN MANAGEMENT Goal: RH STG PAIN MANAGED AT OR BELOW PT'S PAIN GOAL Description: <4 w/ prns Outcome: Progressing   Problem: RH KNOWLEDGE DEFICIT GENERAL Goal: RH STG INCREASE KNOWLEDGE OF SELF CARE AFTER HOSPITALIZATION Description: Manage increase knowledge of self care care after hospitalization with supervision assistance from wife using educational materials provided Outcome: Progressing

## 2024-07-14 NOTE — IPOC Note (Signed)
 Overall Plan of Care [facility]) Patient Details Name: Kristopher Thompson MRN: [id number] DOB: 16-Feb-1951  Admitting Diagnosis: Critical illness myopathy  Hospital Problems: Principal Problem:   Critical illness myopathy Active Problems:   Cognitive and behavioral changes     Functional Problem List: Nursing Bladder, Bowel, Edema, Endurance, Medication Management, Motor, Pain, Safety, Skin Integrity  PT Balance, Edema, Endurance, Motor, Pain, Safety, Skin Integrity  OT Balance, Endurance, Other (Comment) (challenges with maintaining HR)  SLP Cognition, Nutrition  TR         Basic ADL's: OT Grooming, Bathing, Dressing, Toileting     Advanced  ADL's: OT       Transfers: PT Bed Mobility, Bed to Chair, Car  OT Toilet, Tub/Shower     Locomotion: PT Ambulation, Stairs     Additional Impairments: OT    SLP Swallowing      TR      Anticipated Outcomes Item Anticipated Outcome  Self Feeding ModI  Swallowing  min A   Basic self-care  ModI  Toileting  superviison   Bathroom Transfers supervision  Bowel/Bladder  manage rostomy/ ileostomy with supervision assistance  Transfers  supervision  Locomotion  supervision/CGA  Communication     Cognition  mod A  Pain  <4 / prns  Safety/Judgment  manage safety with supervision assistance   Therapy Plan: PT Intensity: Minimum of 1-2 x/day ,45 to 90 minutes PT Frequency: 5 out of 7 days PT Duration Estimated Length of Stay: 10-14 days OT Intensity: Minimum of 1-2 x/day, 45 to 90 minutes OT Frequency: 5 out of 7 days OT Duration/Estimated Length of Stay: 10-14 days SLP Intensity: Minumum of 1-2 x/day, 30 to 90 minutes SLP Frequency: 3 to 5 out of 7 days SLP Duration/Estimated Length of Stay: 10-14 days   Team Interventions: Nursing Interventions Patient/Family Education, Medication Management, Bladder Management, Bowel Management, Disease Management/Prevention, Pain Management, Discharge Planning, Skin Care/Wound  Management, Other (comment)  PT interventions Ambulation/gait training, Discharge planning, Functional mobility training, Therapeutic Activities, Balance/vestibular training, Disease management/prevention, Neuromuscular re-education, Skin care/wound management, Therapeutic Exercise, Wheelchair propulsion/positioning, DME/adaptive equipment instruction, Pain management, UE/LE Strength taining/ROM, Firefighter, Equities trader education, Museum/gallery curator, UE/LE Coordination activities  OT Interventions Warden/ranger, Firefighter, Equities trader education, Self Care/advanced ADL retraining, Therapeutic Exercise, UE/LE Coordination activities, UE/LE Strength taining/ROM, Therapeutic Activities, DME/adaptive equipment instruction, Cognitive remediation/compensation, Skin care/wound managment  SLP Interventions Cognitive remediation/compensation, Functional tasks, Internal/external aids, Patient/family education, Medication managment, Dysphagia/aspiration precaution training, Therapeutic Activities, Therapeutic Exercise  TR Interventions    SW/CM Interventions Discharge Planning, Psychosocial Support, Patient/Family Education   Barriers to Discharge MD  Medical stability  Nursing Decreased caregiver support, Home environment access/layout Discharge: House  Discharge Home Layout: Able to live on main level with bedroom/bathroom  Discharge Home Access: Stairs to enter  Entrance Stairs-Rails: Can reach both  Entrance Stairs-Number of Steps: 2  PT Inaccessible home environment, Decreased caregiver support, Home environment access/layout, Wound Care, Lack of/limited family support, Weight, Nutrition means    OT      SLP      SW Decreased caregiver support, Lack of/limited family support, IV antibiotics     Team Discharge Planning: Destination: PT-Home ,OT- Home , SLP-Home Projected Follow-up: PT-Home health PT, OT-  Home health OT, SLP-None Projected Equipment Needs:  PT-To be determined, OT- To be determined, SLP-None recommended by SLP Equipment Details: PT- , OT-  Patient/family involved in discharge planning: PT- Patient, Family member/caregiver,  OT-Patient, Family member/caregiver, SLP-Patient, Family member/caregiver  MD ELOS: 3-4 weeks Medical  Rehab Prognosis:  Good Assessment: The patient has been admitted for CIR therapies with the diagnosis of critical illness myopathy after prolonged hospital stay/multiple surgical issues. The team will be addressing functional mobility, strength, stamina, balance, safety, adaptive techniques and equipment, self-care, bowel and bladder mgt, patient and caregiver education, pain mgt, cognition, communication, community reentry. Goals have been set at supervision to min assist with self-care and mobility and supervision to mod I with cognitionand communication. Anticipated discharge destination is home with wife.        See Team Conference Notes for weekly updates to the plan of care

## 2024-07-15 ENCOUNTER — Inpatient Hospital Stay (HOSPITAL_COMMUNITY)

## 2024-07-15 DIAGNOSIS — R4189 Other symptoms and signs involving cognitive functions and awareness: Secondary | ICD-10-CM

## 2024-07-15 DIAGNOSIS — R4689 Other symptoms and signs involving appearance and behavior: Secondary | ICD-10-CM

## 2024-07-15 DIAGNOSIS — G7281 Critical illness myopathy: Secondary | ICD-10-CM

## 2024-07-15 LAB — VANCOMYCIN, TROUGH: Vancomycin Tr: 20 ug/mL (ref 15–20)

## 2024-07-15 NOTE — Discharge Instructions (Addendum)
 COMMUNITY REFERRALS UPON DISCHARGE:    Home Health:   PT      OT    ST     RN     SNA                 Agency:[facility]  Phone:  *Please expect follow-up within 2-3 business days for discharge to schedule your home visit. If you have not received follow-up, be sure to contact the site directly.*       Ileostomy Nutrition Therapy   During ileostomy surgery, the entire colon, rectum, and anus are removed or bypassed. The part of the small intestine called the ileum is brought through the abdominal wall, creating a stoma. The stoma is an opening in the abdomen that must be covered with a bag to collect stools at all times. It can be temporary or permanent depending on the surgery.  After surgery, your bowel will be swollen. Your eating plan will begin with a diet of clear liquids. As you recover, you will start eating solid foods, beginning with foods that are low in fiber (see Recommended Foods). You should avoid high-fiber foods because they are harder to digest. Avoiding high-fiber foods will allow the bowel to heal and prevent blockage of the ileostomy.  You should have less than 8 grams of fiber per day when you move from liquids to solid foods and then transition to less than 13 grams of fiber for the whole day in the next few days as your symptoms decrease. Most patients begin to eat more normally 6 weeks after surgery.    The changes to your diet recommended on this handout can help reduce symptoms such as diarrhea, odor, and gas; help you avoid a blockage; and help your body get more nutrients from your food as you heal from surgery.   Tips   You may not feel like eating much, so eat small amounts every 2 to 4 hours. Keep a regular schedule for meals and snacks to help reduce gas and help your body absorb nutrients from foods.  Let your health care provider know if you see whole foods or pills in your ostomy bag.  Do not use time-released, enteric-coated medications or very  large tablets. Also avoid laxatives, which can cause dehydration.  Take a chewable (non-gummy) multivitamin with minerals daily.  Take a chewable or liquid calcium  supplement. Liquid calcium  citrate can be taken with food or without food.  Have your largest meal in the middle of the day. Do not eat large amounts in the evening. This can help decrease stool output at night, so that you can limit emptying the ostomy bag then.  Eat foods that may thicken stool several times a day. Some foods can change the color of the stool. (See the Food Selection Guide.)   When you begin to add more variety back into your diet, add only 1 new food every few days. If there are foods that bothered you before surgery, add other foods first. Eat only a small amount when you re-try foods. If a food makes you feel sick, wait a few weeks and then re-try it. Keep a log of foods you try and how you feel when eating them.  To reduce gas, avoid chewing gum, drinking with straws and drinking carbonated beverages, smoking or chewing tobacco, eating too fast, and skipping meals. Missing meals can increase gas and watery stools.   Some foods may cause blockages (see Food Selection Guide). Use  caution when eating these foods. Eat small amounts and chew your foods well to prevent blockage.  Get enough fluids. Aim for at least 8 to 10 cups of liquid per day. Drink liquids 30 minutes after meals or snacks to avoid flushing foods through your system too quickly.  During times of higher output (1,800 milliliters per day or more) or heavy sweating, you need to drink more fluids. You may need to actually measure how much you are drinking and your output from your ostomy when it is high:   1 ounce is 30 milliliters o 1 cup is 8 ounces  8 cups is 64 ounces or 2 quarts or 2 liters  Watch for signs and symptoms of fluid-electrolyte imbalance. If symptoms occur, seek treatment right away. Symptoms include:   Dry mouth  Reduced urine output (not  as much urine or urinating less often than normal)  Dark-colored urine  Feeling dizzy when you stand up  Noticeable fatigue (feeling extremely tired)  Abdominal cramping  If you have a high output ostomy, you may need to use an oral rehydration solution (ORS) to replace fluid loss. The World Health Organization has a solution in powder form you can buy (called Oral Rehydration Salts). Some sports drinks can increase stoma output, so pediatric electrolyte solutions, such as Pedialyte, are recommended instead. A less expensive option is to make the oral rehydration solution yourself using one of the following recipes:   2 cups Gatorade + 2 cups water  +  teaspoon salt  3 cups water  + 1 cup orange juice +  teaspoon salt +  teaspoon baking soda o  cup grape juice or cranberry juice + 3 cups water  +  teaspoon salt o 1 cup apple juice + 3 cups water  +  teaspoon salt  4 cups (1 liter) water  +  teaspoon table salt + 6 level teaspoons sugar (World Health Organization's ORS recipe)  Your registered dietitian nutritionist or doctor may suggest increasing foods that are higher in sodium and potassium. Remember to choose lower fiber foods that are high in potassium. Some examples of high-potassium foods include soy milk, yogurt, cottage cheese, potatoes without skin, liquid supplements (such as Ensure Muscle Health), orange juice or tomato juice (if these do not cause reflux or other symptoms), smooth peanut butter, and baked or broiled salmon or [place]. Salt substitute that contains potassium chloride  can also be used, but be careful not to overuse it. Higher-sodium foods added to the diet should also be lower fiber and not fried.   Recommended Foods  If particular foods make you feel unwell, stop eating them and try again in 2 to 3 weeks.  Everyone's tolerance to foods is different. Finding foods that are best for you may require some trial and error.   Dairy Foods  Recommended Foods  Notes   Fat-free  (skim) or low-fat (1%) milk*  Soy milk, rice milk, or almond milk  Lactose-free milk  Yogurt*  Powdered milk  Cheese*  Buttermilk  Low-fat ice cream* or sherbet  If you feel unwell after drinking milk or eating dairy foods, try lactose-free products. Foods marked with an asterisk (*) on this list have lactose.  Aged cheeses (such as cheddar and Swiss) are lower in lactose.  Check labels for calcium  content of almond milk and rice milk to make sure they have 30% calcium . These beverages are not high in protein, so include other high-protein foods at meals and snacks to support healing.  Soy milk may cause  gas and bloating for some people. It is best to avoid soy milk if it causes symptoms.    Protein Foods  Recommended Foods  Notes   Very tender, well-cooked meats and poultry prepared without added fat  Fish  Smooth nut butter (limit to 1 to 2 tablespoons a day)  Eggs (scrambled eggs are easiest to digest)  When trying nuts, fish, and eggs, start with small amounts. These foods may cause odors.  Use a moist heating method for meats and poultry:   Use water  or broth to cook the meat or poultry at a lower temperature.  Cover the dish when cooking in the oven, so the food cooks in its own juices.    Marinate meat first with an acidic ingredient, such as vinegar, lemon juice, or wine, and some oil or by using chopped raw pineapple, which has natural enzymes. Pour off the marinade before cooking.     Grains  Recommended Foods  Notes   Grain foods made from white or refined flour, including bread, bagels, rolls; crackers; pasta; and cereal  White rice  Cream of wheat or cream of rice  Refined grits  Cereals made from refined grains, without added fiber, such as rice chex or cornflakes  Choose grain foods with less than 2 grams of fiber per serving. The grams of dietary fiber in 1 serving are listed on the Nutrition Facts label of packaged foods.  Read labels if you have problems with lactose.  Any foods containing milk may contain lactose.    Vegetables  Recommended Foods  Notes   Well-cooked vegetables without seeds or skins, such as green beans or carrots  Potatoes without skin  Shredded lettuce on sandwich  Strained vegetable juice  Some vegetables may cause gas, blockages, or odors for some people. See the Food Selection Guide for foods that may cause symptoms.    Fruits  Recommended Foods  Notes   Pulp-free fruit juices (except prune juice)  Ripe banana  Soft melons (watermelon or honeydew)  Peeled and cooked apple  Canned fruits (except pineapple), but avoid heavy syrup, which can make diarrhea worse  These can be added later with doctor's OK to increase fiber:  Some fruits may cause blockages. See the Food Selection Guide for foods that may cause symptoms.  Look for 100% fruit juices. These may need to be diluted in half or used to make oral rehydration solutions to be tolerated well.   ?  ?  Avocado  Orange or grapefruit without  membrane  Grapefruit and grapefruit juice should not be eaten when taking statin-type medications or budesonide (Entocort).    Fats   Recommended Foods  Notes   Any; olive oil and canola oil are good choices for heart health.  Start with very small amounts. Limit fats and oils to less than 8 teaspoons per day. Fats may cause symptoms or discomfort.  Spreads, butter contain lactose.    Beverages  Recommended Beverages  Notes   Water   Decaffeinated coffee or tea  Noncarbonated beverages  Rehydration beverages  Carbonated beverages may cause gas.     Foods Not Recommended    When you first start eating solid foods, avoid foods that are high in fiber, such as whole grains, dried beans, and most raw vegetables and fruits. (See Foods Recommended list for low-fiber foods. See Food Selection Guide lists for foods that cause symptoms.) ? Avoid acidic, spicy, fried, and greasy foods and foods high in sugar.  Limit food and  drinks that contain  sugar substitutes. Often diluted sugar-containing drinks do better. Cut fruit juice in half by adding an equal amount of water  or more.  While you heal, avoid any foods that cause you to have odor, gas, diarrhea, or obstruction.  You should not have any salad or raw vegetables.  If you have kidney stones, you should try to avoid foods that are high in oxalate. Your RDN may give you a more detailed list of high-oxalate foods if you are at risk. Some foods that are high in oxalate include:   Grains: Wheat bran, wheat germ, whole wheat flour   Protein Foods: Beans, tofu, nuts  Vegetables: Beets, dark leafy greens, sweet potato  Beverages: Beer, cocoa, instant tea, instant coffee  Other: Carob, chocolate    Food Selection Guide for Ileostomy Patients   Foods That May Cause Blockage   Apples, unpeeled  Bean sprouts  Cabbage, raw  Casing on sausage  Celery  Chinese vegetables  Coconut  Coleslaw  Corn  Popcorn  Relishes and olives  Salad greens  Seeds and nuts  Spinach Tough, fibrous meats (for example, steak on grill)  Vegetable and fruit skins  Whole grains  Cucumbers  Dried fruit, raisins  Grapes  Green peppers  Mushrooms  Nuts  Peas  Pickles  Pineapple      Foods That May Cause Gas or Odor  Alcohol   Apples  Asparagus  Bananas  Beer  Broccoli  Brussels sprouts  Cabbage  Carbonated beverages  Cauliflower  Cheese, some types  Corn  Cucumber  Dairy products  Dried beans and peas  Eggs  Fatty foods  Fish  Grapes  Green pepper  Melons  Onions  Peanuts  Prunes  Radishes  Turnips     Foods That May Help Relieve Gas and Odor  Buttermilk  Cranberry juice  Parsley  Yogurt with active cultures      Diarrhea   Initially after surgery, the stool will be liquid and watery because of where the ileostomy is located in the intestine. The stool will gradually become thicker over the next few weeks (more like a consistency of pudding or oatmeal). There are some  foods that can help make the stool thicker or thinner, but if changes to your diet do not improve stools, then your doctor may be able to recommend some medications to help.   Foods That May Cause Diarrhea (looser or more frequent stool)  Alcohol  (including beer)  Apricots (and stone fruits)  Beans, baked or legumes  Bran  Broccoli  Brussels sprouts  Cabbage  Caffeinated drinks  (especially hot)  Chocolate  Corn  Fried meats, fish poultry  Fruit juice: apple, grape, orange  Fruit: fresh, canned, or dried  Glucose-free foods containing mannitol or  sorbitol  Gum, sugar free   High-fat foods  High-sugar foods  Licorice  Milk and dairy foods  Nuts or seeds  Peaches (stone fruit)  Peas  Plums (stone fruit)  Prune juice or prunes  Soup  Spicy foods  Sugar-free substitutes  Tomatoes  Turnip greens/green leafy vegetables, raw  Wheat/whole grains  Wine    Foods That May Help Thicken Stool  Applesauce   Bananas   Barley (when OK to have fiber)   Cheese   Marshmallows   Oatmeal (when OK to have fiber)  Pasta (sauces may increase symptoms)  Peanut butter, creamy  Potatoes, no skin  Pretzels  Saltines  Tapioca  White bread (not high fiber)  White rice, boiled  Yogurt   Foods That May Discolor Stool   Turn stool red  Darken stool  Beets  Asparagus  Foods with red dye  Broccoli   Spinach      Ileostomy Sample 1-Day Menu  Note: It may help to not have beverages with meals or snacks, waiting 30 minutes before drinking. Fiber is rounded to the nearest 0.5g and listed in parentheses ( ) after foods that contain fiber.   Meal  Menu  Total Fiber   Breakfast  1 scrambled egg plain or with  cup low-fat, low-lactose cheese  English muffin or white toast (1.5 grams fiber) with 1 teaspoon margarine   cup unsweetened applesauce (1.5 grams fiber)  3.0 grams   30 minutes after breakfast  4 ounces cranberry grape juice diluted with 4 ounces water    1 cups (12 ounces) decaf  tea     Lunch   2 ounces shaved or very thinly sliced roast beef with juices   cup mashed potatoes (1.5 grams fiber) (made with lactose-free milk or chicken broth and olive oil or very small amount of butter)   cup green beans, well-cooked (2.0 grams fiber)  1 cup canned peaches, extra light syrup (1.0 gram fiber)  4.5 grams   30 minutes after lunch  1 cup (8 ounces) fat-free, soy, or lactose-free milk     Snack  8 ounces yogurt without fruits or nuts  1 ripe banana (3.0 grams fiber)    3.0 grams   30 min after snack  1 cup (8 ounces) sport drink or Pedialyte or Oral Rehydration Solution (ORS) if needed     Dinner  [place] sandwich: 2 ounces [place], 1 ounce Swiss cheese,  2 slices white bread (1.0 gram fiber)  2 ounces pretzels (1.0 gram fiber)  2.0 grams   30 minutes after dinner  1 cups water  or Gatorade/Pedialyte or ORS     Snack   6 saltine crackers (0.5 grams fiber)  1 ounce low-fat cheddar cheese or Swiss  0.5 grams   30 minutes after snack  1 cup (8 ounces) fat-free, soy, or lactose-free milk         13 grams fiber daily     High-Calorie, High-Protein Nutrition Therapy (2021) A high-calorie, high-protein diet has been recommended to you. Your registered dietitian nutritionist (RDN) may have recommended this diet because you are having difficulty eating enough calories throughout the day, you have lost weight, and/or you need to add protein to your diet. Sometimes you may not feel like eating, even if you know the importance of good nutrition. The recommendations in this handout can help you with the following: Regaining your strength and energy Keeping your body healthy Healing and recovering from surgery or illness and fighting infection Tips: Schedule Your Meals and Snacks Several small meals and snacks are often better tolerated and digested than large meals. Strategies Plan to eat 3 meals and 3 snacks daily. Experiment with timing meals to find out when you have a larger  appetite. Appetite may be greatest in the morning after not eating all night so you may prefer to eat your larger meals and snacks in the morning and at lunch. Breakfast-type foods are often better tolerated so eat foods such as eggs, pancakes, waffles and cereal for any meal or snack. Carry snacks with you so you are prepared to eat every 2 to 3 hours. Determine what works best for you if your body's cues for feeling hungry or full are not working.  Eat a small meal or snack even if you don't feel hungry. Set a timer to remind you when it is time to eat. Take a walk before you eat (with health care provider's approval). Light or moderate physical activity can help you maintain muscle and increase your appetite. Make Eating Enjoyable Taking steps to make the experience enjoyable may help to increase your interest in eating and improve your appetite. Strategies: Eat with others whenever possible. Include your favorite foods to make meals more enjoyable. Try new foods. Save your beverage for the end of the meal so that you have more room for food before you get full. Add Calories to Your Meals and Snacks Try adding calorie-dense foods so that each bite provides more nutrition. Strategies Drink milk, chocolate milk, soy milk, or smoothies instead of low-calorie beverages such as diet drinks or water . Cook with milk or soy milk instead of water  when making dishes such as hot cereal, cocoa, or pudding. Add jelly, jam, honey, butter or margarine to bread and crackers. Add jam or fruit to ice cream and as a topping over cake. Mix dried fruit, nuts, granola, honey, or dry cereal with yogurt or hot cereals. Enjoy snacks such as milkshakes, smoothies, pudding, ice cream, or custard. Blend a fruit smoothie of a banana, frozen berries, milk or soy milk, and 1 tablespoon nonfat powdered milk or protein powder. Add Protein to Your Meals and Snacks Choose at least one protein food at each meal and snack to  increase your daily intake. Strategies Add  cup nonfat dry milk powder or protein powder to make a high-protein milk to drink or to use in recipes that call for milk. Vanilla or peppermint extract or unsweetened cocoa powder could help to boost the flavor. Add hard-cooked eggs, leftover meat, grated cheese, canned beans or tofu to noodles, rice, salads, sandwiches, soups, casseroles, pasta, tuna and other mixed dishes. Add powdered milk or protein powder to hot cereals, meatloaf, casseroles, scrambled eggs, sauces, cream soups, and shakes. Add beans and lentils to salads, soups, casseroles, and vegetable dishes. Eat cottage cheese or yogurt, especially Greek yogurt, with fruit as a snack or dessert. Eat peanut or other nut butters on crackers, bread, toast, waffles, apples, bananas or celery sticks. Add it to milkshakes, smoothies, or desserts. Consider a ready-made protein shake. Your RDN will make recommendations. Add Fats to Your Meals and Snacks Try adding fats to your meals and snacks. Fat provides more calories in fewer bites than carbohydrate or protein and adds flavors to your foods. Strategies Snack on nuts and seeds or add them to foods like salads, pasta, cereals, yogurt, and ice cream.  Saut or stir-fry vegetables, meats, chicken, fish or tofu in olive or canola oil.  Add olive oil, other vegetable oils, butter or margarine to soups, vegetables, potatoes, cooked cereal, rice, pasta, bread, crackers, pancakes, or waffles. Snack on olives or add to pasta, pizza, or salad. Add avocado or guacamole to your salads, sandwiches, and other entrees. Include fatty fish such as salmon in your weekly meal plan. For general food safety tips, especially for clients with immunocompromised conditions, ask your RDN for the Food Safety Nutrition Therapy handout. Small Meal and Snack Ideas These snacks and meals are recommended when you have to eat but aren't necessarily hungry.  They are good choices  because they are high in protein and high in calories.  2 graham crackers 2 tablespoons peanut or other nut butter 1 cup milk 2 slices whole wheat toast  topped with:  avocado, mashed Seasoning of your choice   cup Greek yogurt  cup fruit  cup granola 2 deviled egg halves 5 whole wheat crackers  1 cup cream of tomato soup  grilled cheese sandwich 1 toasted waffle topped with: 2 tablespoons peanut or nut butter 1 tablespoon jam  Trail mix made with:  cup nuts  cup dried fruit  cup cold cereal, any variety  cup oatmeal or cream of wheat cereal 1 tablespoon peanut or nut butter  cup diced fruit   High-Calorie, High-Protein Sample 1-Day Menu View Nutrient Info Breakfast 1 egg, scrambled 1 ounce cheddar cheese 1 English muffin, whole wheat 1 tablespoon margarine 1 tablespoon jam  cup orange juice, fortified with calcium  and vitamin D   Morning Snack 1 tablespoon peanut butter 1 banana 1 cup 1% milk  Lunch Tuna salad sandwich made with: 2 slices bread, whole wheat 3 ounces tuna mixed with: 1 tablespoon mayonnaise  cup pudding  Afternoon Snack  cup hummus  cup carrots 1 pita  Evening Meal Enchilada casserole made with: 2 corn tortillas 3 ounces ground beef, cooked  cup black beans, cooked  cup corn, cooked 1 ounce grated cheddar cheese  cup enchilada sauce  avocado, sliced, topping for enchilada 1 tablespoon sour cream, topping for enchilada Salad:  cup lettuce, shredded  cup tomatoes, chopped, for salad 1 tablespoon olive oil and vinegar dressing, for salad  Evening Snack  cup [place] yogurt  cup blueberries  cup granola      #######################################################  Ostomy Support Information  You've heard that people get along just fine with only one of their eyes, or one of their lungs, or one of their kidneys. But you also know that you have only one intestine and only one bladder, and that leaves you feeling awfully empty,  both physically and emotionally: You think no other people go around without part of their intestine with the ends of their intestines sticking out through their abdominal walls.   YOU ARE NOT ALONE.  There are nearly three quarters of a million people in the [place]  who have an ostomy; people who have had surgery to remove all or part of their colons or bladders.   There is even a national association, the [place] [employer] [place] with over 350 local affiliated support groups that are organized by volunteers who provide peer support and counseling. [facility] has a toll free telephone num-ber, 667-763-7369 and an educational, interactive website, www.ostomy.org   An ostomy is an opening in the belly (abdominal wall) made by surgery. Ostomates are people who have had this procedure. The opening (stoma) allows the kidney or bowel to grdischarge waste. An external pouch covers the stoma to collect waste. Pouches are are a simple bag and are odor free. Different companies have disposable or reusable pouches to fit one's lifestyle. An ostomy can either be temporary or permanent.   THERE ARE THREE MAIN TYPES OF OSTOMIES Colostomy. A colostomy is a surgically created opening in the large intestine (colon). Ileostomy. An ileostomy is a surgically created opening in the small intestine. Urostomy. A urostomy is a surgically created opening to divert urine away from the bladder.  OSTOMY Care  The following guidelines will make care of your colostomy easier. Keep this information close by for quick reference.  Helpful DIET hints Eat a well-balanced diet including vegetables and fresh fruits. Eat on a regular schedule.  Drink at least 6 to 8 glasses of fluids daily. Eat slowly in a relaxed  atmosphere. Chew your food thoroughly. Avoid chewing gum, smoking, and drinking from a straw. This will help decrease the amount of air you swallow, which may help reduce gas. Eating yogurt or drinking buttermilk may  help reduce gas.  To control gas at night, do not eat after 8 p.m. This will give your bowel time to quiet down before you go to bed.  If gas is a problem, you can purchase Beano. Sprinkle Beano on the first bite of food before eating to reduce gas. It has no flavor and should not change the taste of your food. You can buy Beano over the counter at your local drugstore.  Foods like fish, onions, garlic, broccoli, asparagus, and cabbage produce odor. Although your pouch is odor-proof, if you eat these foods you may notice a stronger odor when emptying your pouch. If this is a concern, you may want to limit these foods in your diet.  If you have an ileostomy, you will have chronic diarrhea & need to drink more liquids to avoid getting dehydrated.  Consider antidiarrheal medicine like imodium  (loperamide ) or Lomotil  to help slow down bowel movements / diarrhea into your ileostomy bag.  GETTING TO GOOD BOWEL HEALTH WITH AN ILEOSTOMY    With the colon bypassed & not in use, you will have small bowel diarrhea.   It is important to thicken & slow your bowel movements down.   The goal: 4-6 small BOWEL MOVEMENTS A DAY It is important to drink plenty of liquids to avoid getting dehydrated  CONTROLLING ILEOSTOMY DIARRHEA  TAKE A FIBER SUPPLEMENT (FiberCon or Benefiner soluble fiber) twice a day - to thicken stools by absorbing excess fluid and retrain the intestines to act more normally.  Slowly increase the dose over a few weeks.  Too much fiber too soon can backfire and cause cramping & bloating.  TAKE AN IRON  SUPPLEMENT twice a day to naturally constipate your bowels.  Usually ferrous sulfate  325mg  twice a day)  TAKE ANTI-DIARRHEAL MEDICINES: Loperamide  (Imodium ) can slow down diarrhea.  Start with two tablets (= 4mg ) first and then try one tablet every 6 hours.  Can go up to 2 pills four times day (8 pills of 2mg  max) Avoid if you are having fevers or severe pain.  If you are not better or start  feeling worse, stop all medicines and call your doctor for advice LoMotil  (Diphenoxylate  / Atropine ) is another medicine that can constipate & slow down bowel moevements Pepto Bismol (bismuth) can gently thicken bowels as well  If diarrhea is worse,: drink plenty of liquids and try simpler foods for a few days to avoid stressing your intestines further. Avoid dairy products (especially milk & ice cream) for a short time.  The intestines often can lose the ability to digest lactose when stressed. Avoid foods that cause gassiness or bloating.  Typical foods include beans and other legumes, cabbage, broccoli, and dairy foods.  Every person has some sensitivity to other foods, so listen to our body and avoid those foods that trigger problems for you.Call your doctor if you are getting worse or not better.  Sometimes further testing (cultures, endoscopy, X-ray studies, bloodwork, etc) may be needed to help diagnose and treat the cause of the diarrhea. Take extra anti-diarrheal medicines (maximum is 8 pills of 2mg  loperamide  a day)   Tips for POUCHING an OSTOMY   Changing Your Pouch The best time to change your pouch is in the morning, before eating or drinking anything. Your stoma can  function at any time, but it will function more after eating or drinking.   Applying the pouching system  Place all your equipment close at hand before removing your pouch.  Wash your hands.  Stand or sit in front of a mirror. Use the position that works best for you. Remember that you must keep the skin around the stoma wrinkle-free for a good seal.  Gently remove the used pouch (1-piece system) or the pouch and old wafer (2-piece system). Empty the pouch into the toilet. Save the closure clip to use again.  Wash the stoma itself and the skin around the stoma. Your stoma may bleed a little when being washed. This is normal. Rinse and pat dry. You may use a wash cloth or soft paper towels (like Bounty), mild soap  (like Dial, Safeguard, or [place]), and water . Avoid soaps that contain perfumes or lotions.  For a new pouch (1-piece system) or a new wafer (2-piece system), measure your stoma using the stoma guide in each box of supplies.  Trace the shape of your stoma onto the back of the new pouch or the back of the new wafer. Cut out the opening. Remove the paper backing and set it aside.  Optional: Apply a skin barrier powder to surrounding skin if it is irritated (bare or weeping), and dust off the excess. Optional: Apply a skin-prep wipe (such as Skin Prep or All-Kare) to the skin around the stoma, and let it dry. Do not apply this solution if the skin is irritated (red, tender, or broken) or if you have shaved around the stoma. Optional: Apply a skin barrier paste (such as Stomahesive, Coloplast, or Premium) around the opening cut in the back of the pouch or wafer. Allow it to dry for 30 to 60 seconds.  Hold the pouch (1-piece system) or wafer (2-piece system) with the sticky side toward your body. Make sure the skin around the stoma is wrinkle-free. Center the opening on the stoma, then press firmly to your abdomen (Fig. 4). Look in the mirror to check if you are placing the pouch, or wafer, in the right position. For a 2-piece system, snap the pouch onto the wafer. Make sure it snaps into place securely.  Place your hand over the stoma and the pouch or wafer for about 30 seconds. The heat from your hand can help the pouch or wafer stick to your skin.  Add deodorant (such as Super Banish or Nullo) to your pouch. Other options include food extracts such as vanilla oil and peppermint extract. Add about 10 drops of the deodorant to the pouch. Then apply the closure clamp. Note: Do not use toxic  chemicals or commercial cleaning agents in your pouch. These substances may harm the stoma.  Optional: For extra seal, apply tape to all 4 sides around the pouch or wafer, as if you were framing a picture. You may use  any brand of medical adhesive tape. Change your pouch every 5 to 7 days. Change it immediately if a leak occurs.  Wash your hands afterwards.  If you are wearing a 2-piece system, you may use 2 new pouches per week and alternate them. Rinse the pouch with mild soap and warm water  and hang it to dry for the next day. Apply the fresh pouch. Alternate the 2 pouches like this for a week. After a week, change the wafer and begin with 2 new pouches. Place the old pouches in a plastic bag, and put them in  the trash.   LIVING WITH AN OSTOMY  Emptying Your Pouch Empty your pouch when it is one-third full (of urine, stool, and/or gas). If you wait until your pouch is fuller than this, it will be more difficult to empty and more noticeable. When you empty your pouch, either put toilet paper in the toilet bowl first, or flush the toilet while you empty the pouch. This will reduce splashing. You can empty the pouch between your legs or to one side while sitting, or while standing or stooping. If you have a 2-piece system, you can snap off the pouch to empty it. Remember that your stoma may function during this time. If you wish to rinse your pouch after you empty it, a turkey baster can be helpful. When using a baster, squirt water  up into the pouch through the opening at the bottom. With a 2-piece system, you can snap off the pouch to rinse it. After rinsing  your pouch, empty it into the toilet. When rinsing your pouch at home, put a few granules of Dreft soap in the rinse water . This helps lubricate and freshen your pouch. The inside of your pouch can be sprayed with non-stick cooking oil (Pam spray). This may help reduce stool sticking to the inside of the pouch.  Bathing You may shower or bathe with your pouch on or off. Remember that your stoma may function during this time.  The materials you use to wash your stoma and the skin around it should be clean, but they do not need to be sterile.  Wearing  Your Pouch During hot weather, or if you perspire a lot in general, wear a cover over your pouch. This may prevent a rash on your skin under the pouch. Pouch covers are sold at ostomy supply stores. Wear the pouch inside your underwear for better support. Watch your weight. Any gain or loss of 10 to 15 pounds or more can change the way your pouch fits.  Going Away From Home A collapsible cup (like those that come in travel kits) or a soft plastic squirt bottle with a pull-up top (like a travel bottle for shampoo) can be used for rinsing your pouch when you are away from home. Tilt the opening of the pouch at an upward angle when using a cup to rinse.  Carry wet wipes or extra tissues to use in public bathrooms.  Carry an extra pouching system with you at all times.  Never keep ostomy supplies in the glove compartment of your car. Extreme heat or cold can damage the skin barriers and adhesive wafers on the pouch.  When you travel, carry your ostomy supplies with you at all times. Keep them within easy reach. Do not pack ostomy supplies in baggage that will be checked or otherwise separated from you, because your baggage might be lost. If you're traveling out of the country, it is helpful to have a letter stating that you are carrying ostomy supplies as a medical necessity.  If you need ostomy supplies while traveling, look in the yellow pages of the telephone book under "Surgical Supplies." Or call the local ostomy organization to find out where supplies are available.  Do not let your ostomy supplies get low. Always order new pouches before you use the last one.  Reducing Odor Limit foods such as broccoli, cabbage, onions, fish, and garlic in your diet to help reduce odor. Each time you empty your pouch, carefully clean the opening of the pouch, both  inside and outside, with toilet paper. Rinse your pouch 1 or 2 times daily after you empty it (see directions for emptying your pouch and going  away from home). Add deodorant (such as Super Banish or Nullo) to your pouch. Use air deodorizers in your bathroom. Do not add aspirin to your pouch. Even though aspirin can help prevent odor, it could cause ulcers on your stoma.  When to call the doctor Call the doctor if you have any of the following symptoms: Purple, black, or white stoma Severe cramps lasting more than 6 hours Severe watery discharge from the stoma lasting more than 6 hours No output from the colostomy for 3 days Excessive bleeding from your stoma Swelling of your stoma to more than 1/2-inch larger than usual Pulling inward of your stoma below skin level Severe skin irritation or deep ulcers Bulging or other changes in your abdomen  When to call your ostomy nurse Call your ostomy/enterostomal therapy (WOCN) nurse if any of the following occurs: Frequent leaking of your pouching system Change in size or appearance of your stoma, causing discomfort or problems with your pouch Skin rash or rawness Weight gain or loss that causes problems with your pouch     FREQUENTLY ASKED QUESTIONS   Why haven't you met any of these folks who have an ostomy?  Well, maybe you have! You just did not recognize them because an ostomy doesn't show. It can be kept secret if you wish. Why, maybe some of your best friends, office associates or neighbors have an ostomy ... you never can tell. People facing ostomy surgery have many quality-of-life questions like: Will you bulge? Smell? Make noises? Will you feel waste leaving your body? Will you be a captive of the toilet? Will you starve? Be a social outcast? Get/stay married? Have babies? Easily bathe, go swimming, bend over?  OK, let's look at what you can expect:   Will you bulge?  Remember, without part of the intestine or bladder, and its contents, you should have a flatter tummy than before. You can expect to wear, with little exception, what you wore before surgery ... and this  in-cludes tight clothing and bathing suits.   Will you smell?  Today, thanks to modern odor proof pouching systems, you can walk into an ostomy support group meeting and not smell anything that is foul or offensive. And, for those with an ileostomy or colostomy who are concerned about odor when emptying their pouch, there are in-pouch deodorants that can be used to eliminate any waste odors that may exist.   Will you make noises?  Everyone produces gas, especially if they are an air-swallower. But intestinal sounds that occur from time to time are no differ-ent than a gurgling tummy, and quite often your clothing will muffle any sounds.   Will you feel the waste discharges?  For those with a colostomy or ileostomy there might be a slight pressure when waste leaves your body, but understand that the intestines have no nerve endings, so there will be no unpleasant sensations. Those with a urostomy will probably be unaware of any kidney drainage.   Will you be a captive of the toilet?  Immediately post-op you will spend more time in the bathroom than you will after your body recovers from surgery. Every person is different, but on average those with an ileostomy or urostomy may empty their pouches 4 to 6 times a day; a little  less if you have a colostomy. The average  wear time between pouch system changes is 3 to 5 days and the changing process should take less than 30 minutes.   Will I need to be on a special diet? Most people return to their normal diet when they have recovered from surgery. Be sure to chew your food well, eat a well-balanced diet and drink plenty of fluids. If you experience problems with a certain food, wait a couple of weeks and try it again.  Will there be odor and noises? Pouching systems are designed to be odor-proof or odor-resistant. There are deodorants that can be used in the pouch. Medications are also available to help reduce odor. Limit gas-producing foods and  carbonated beverages. You will experience less gas and fewer noises as you heal from surgery.  How much time will it take to care for my ostomy? At first, you may spend a lot of time learning about your ostomy and how to take care of it. As you become more comfortable and skilled at changing the pouching system, it will take very little time to care for it.   Will I be able to return to work? People with ostomies can perform most jobs. As soon as you have healed from surgery, you should be able to return to work. Heavy lifting (more than 10 pounds) may be discouraged.   What about intimacy? Sexual relationships and intimacy are important and fulfilling aspects of your life. They should continue after ostomy surgery. Intimacy-related concerns should be discussed openly between you and your partner.   Can I wear regular clothing? You do not need to wear special clothing. Ostomy pouches are fairly flat and barely noticeable. Elastic undergarments will not hurt the stoma or prevent the ostomy from functioning.   Can I participate in sports? An ostomy should not limit your involvement in sports. Many people with ostomies are runners, skiers, swimmers or participate in other active lifestyles. Talk with your caregiver first before doing heavy physical activity.  Will you starve?  Not if you follow doctor's orders at each stage of your post-op adjustment. There is no such thing as an "ostomy diet". Some people with an ostomy will be able to eat and tolerate anything; others may find diffi-culty with some foods. Each person is an individual and must determine, by trial, what is best for them. A good practice for all is to drink plenty of water .   Will you be a social outcast?  Have you met anyone who has an ostomy and is a social outcast? Why should you be the first? Only your attitude and self image will effect how you are treated. No confi-dent person is an Investment banker, corporate.    PROFESSIONAL HELP    Resources are available if you need help or have questions about your ostomy.   Specially trained nurses called Wound, Ostomy Continence Nurses (WOCN) are available for consultation in most major medical centers.  Consider getting an ostomy consult at an outpatient ostomy clinic.   [place] has an Ostomy Clinic run by an Chartered certified accountant at the [facility] campus.  [phone]. [facility] can help set up an appointment   The [employer] ([employer]) is a group made up of many local chapters throughout the [place] . These local groups hold meetings and provide support to prospective and existing ostomates. They sponsor educational events and have qualified visitors to make personal or telephone visits. Contact the [employer]OA for the chapter nearest you and for other educational publications.  More detailed information can be found in Colostomy Guide, a publication of the [employer] (UOA). Contact [employer]OA at 1-516-560-5372 or visit their web site at YellowSpecialist.at. The website contains links to other sites, suppliers and resources.  Media planner Start Services: Start at the website to enlist for support.  Your Wound Ostomy (WOCN) nurse may have started this process. [email or web address] Secure Start services are designed to support people as they live their lives with an ostomy or neurogenic bladder. Enrolling is easy and at no cost to the patient. We realize that each person's needs and life journey are different. Through Secure Start services, we want to help people live their life, their way.  #######################################################    WOUND CARE  It is important that the wound be kept open.   -Keeping the skin edges apart will allow the wound to gradually heal from the base upwards.   - If the skin edges of the wound close too early, a new fluid pocket can form and infection can occur. -This is the reason  to pack deeper wounds with gauze or ribbon -This is why drained wounds cannot be sewed closed right away  A healthy wound should form a lining of bright red beefy granulating tissue that will help shrink the wound and help the edges grow new skin into it.   -A little mucus / yellow discharge is normal (the body's natural way to try and form a scab) and should be gently washed off with soap and water  with daily dressing changes.  -Green or foul smelling drainage implies bacterial colonization and can slow wound healing - a short course of antibiotic ointment (3-5 days) can help it clear up.  Call the doctor if it does not improve or worsens  -Avoid use of antibiotic ointments for more than a week as they can slow wound healing over time.    -Sometimes other wound care products will be used to reduce need for dressing changes and/or help clean up dirty wounds -Sometimes the surgeon needs to debride the wound in the office to remove dead or infected tissue out of the wound so it can heal more quickly and safely.    Change the dressing at least once a day -Wash the wound with mild soap and water  gently every day.  It is good to shower or bathe the wound to help it clean out. -Use clean 4x4 gauze for medium/large wounds or ribbon plain NU-gauze for smaller wounds (it does not need to be sterile, just clean) -Keep the raw wound moist with a little saline or KY (saline) gel on the gauze.  -A dry wound will take longer to heal.  -Keep the skin dry around the wound to prevent breakdown and irritation. -Pack the wound down to the base -The goal is to keep the skin apart, not overpack the wound -Use a Q-tip or blunt-tipped kabob stick toothpick to push the gauze down to the base in narrow or deep wounds   -Cover with a clean gauze and tape -paper or Medipore tape tend to be gentle on the skin -rotate the orientation of the tape to avoid repeated stress/trauma on the skin -using an ACE or Coban wrap on  wounds on arms or legs can be used instead.  Complete all antibiotics through the entire prescription to help the infection heal and prevent new places of infection   Returning the see the surgeon is helpful to follow the healing process and help the wound close as fast  as possible.

## 2024-07-15 NOTE — Progress Notes (Signed)
 PROGRESS NOTE   Subjective/Complaints: Pt up at EOB with OT. HR seems better, doesn't feel light-headed. PO intake up and down  ROS: Patient denies fever, rash, sore throat, blurred vision,  vomiting, diarrhea, cough, shortness of breath or chest pain, headache, or mood change.    Objective:   No results found. Recent Labs    07/13/24 0546  WBC 10.3  HGB 11.5*  HCT 35.7*  PLT 245   Recent Labs    07/13/24 0546  NA 139  K 3.4*  CL 108  CO2 20*  GLUCOSE 81  BUN 41*  CREATININE 1.52*  CALCIUM  9.7    Intake/Output Summary (Last 24 hours) at 07/15/2024 0850 Last data filed at 07/15/2024 0750 Gross per 24 hour  Intake 400 ml  Output 2800 ml  Net -2400 ml        Physical Exam: Vital Signs Blood pressure 100/62, pulse 64, temperature (!) 97.3 F (36.3 C), resp. rate 16, height 5' 11 (1.803 m), weight 87.5 kg, SpO2 100%.  Constitutional: No distress . Vital signs reviewed. HEENT: NCAT, EOMI, oral membranes moist Neck: supple Cardiovascular: RRR without murmur. No JVD. HR in 90's at EOB   Respiratory/Chest: CTA Bilaterally without wheezes or rales. Normal effort    GI/Abdomen: BS +, non-tender, non-distended, ostomy sealed Ext: no clubbing, cyanosis, or edema Psych: quiet but pleasant and cooperative  Skin: abdominal wound pink with granulation tissue present. Uro: ostomies in place with clear output yellow/clear  Neuro:  Alert and oriented x 3. Normal insight and awareness. Intact Memory. Normal language and speech. Cranial nerve exam unremarkable. MMT: BUE 4/5 prox to distal. BLE 2/5 HF, 3-KE and 4/5 ADF/PF. Sensory exam normal for light touch and pain in all 4 limbs. No limb ataxia or cerebellar signs. No abnormal tone appreciated.  .  Prior neuro assessment is c/w 07/15/2024 exam.  Musculoskeletal: both knees remain tight in about 20 degrees flexion contractures    Assessment/Plan: 1. Functional deficits  which require 3+ hours per day of interdisciplinary therapy in a comprehensive inpatient rehab setting. Physiatrist is providing close team supervision and 24 hour management of active medical problems listed below. Physiatrist and rehab team continue to assess barriers to discharge/monitor patient progress toward functional and medical goals  Care Tool:  Bathing    Body parts bathed by patient: Chest, Face, Abdomen, Right arm, Left arm   Body parts bathed by helper: Left upper leg, Right lower leg, Right upper leg, Buttocks, Front perineal area, Left lower leg     Bathing assist Assist Level: Moderate Assistance - Patient 50 - 74% (Mod/MaxA)     Upper Body Dressing/Undressing Upper body dressing   What is the patient wearing?: Pull over shirt    Upper body assist Assist Level: Maximal Assistance - Patient 25 - 49%    Lower Body Dressing/Undressing Lower body dressing      What is the patient wearing?: Pants, Incontinence brief     Lower body assist Assist for lower body dressing: Maximal Assistance - Patient 25 - 49% (MaxA /Dep)     Toileting Toileting    Toileting assist Assist for toileting:  (patient has colosteomy and urostomy)  Transfers Chair/bed transfer  Transfers assist  Chair/bed transfer activity did not occur: Safety/medical concerns  Chair/bed transfer assist level: 2 Helpers     Locomotion Ambulation   Ambulation assist   Ambulation activity did not occur: Safety/medical concerns (fatigue/dizziness/orthostatics)          Walk 10 feet activity   Assist  Walk 10 feet activity did not occur: Safety/medical concerns (fatigue/dizziness/orthostatics)        Walk 50 feet activity   Assist Walk 50 feet with 2 turns activity did not occur: Safety/medical concerns (fatigue/dizziness/orthostatics)         Walk 150 feet activity   Assist Walk 150 feet activity did not occur: Safety/medical concerns  (fatigue/dizziness/orthostatics)         Walk 10 feet on uneven surface  activity   Assist Walk 10 feet on uneven surfaces activity did not occur: Safety/medical concerns (fatigue/dizziness/orthostatics)         Wheelchair     Assist Is the patient using a wheelchair?: Yes Type of Wheelchair: Manual           Wheelchair 50 feet with 2 turns activity    Assist    Wheelchair 50 feet with 2 turns activity did not occur: Safety/medical concerns       Wheelchair 150 feet activity     Assist  Wheelchair 150 feet activity did not occur: Safety/medical concerns       Blood pressure 100/62, pulse 64, temperature (!) 97.3 F (36.3 C), resp. rate 16, height 5' 11 (1.803 m), weight 87.5 kg, SpO2 100%. Medical Problem List and Plan: 1. 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  H/o PE/Antithrombotics: -DVT/anticoagulation:  Pharmaceutical: Eliquis              -antiplatelet therapy: N/A 3. Pain Management: Oxycodone  prn.  4. Mood/Behavior/Sleep: LCSW to follow for evaluation and support.              --melatonin for insomnia.              -antipsychotic agents: N/A 5. Neuropsych/cognition: This patient is not capable of making decisions on his own behalf. 6. Skin/Wound Care: Routine pressure relief measures. Encourage adequate diet             --dressing changes daily. Gerhardt's cream to buttock wound.  7. Fluids/Electrolytes/Nutrition/dysphagia: Monitor I/O. Check CMET in am. Periactin  for appetite             --continue juven bid. Has been refusing Ensure supplement.   -kdur 20meq daily for potassium of 3.4  -can't use zofran  d/t QTc prolongation (490)  -for MBS today 9/3  -labs again 9/4 8. Sepsis/Abdominal wall/mesh infection: IV Vanc/Ceftriaxone  for 6 weeks from 08/21              --Probable 6 months total of antibiotics--ID follow up 09/18 @10  am --Corynebacterium species-->6 more weeks Dalbavancin? V/s tedezolid/linezolid . Ecoli-->ceftriaxone  to Omadocycline pending susceptibility.  -ESR 50, CRP 0.9 9. Left> right hydronephrosis: Nephrostomy tubes with stent exchanged on 08/27 --continue clamping right tube and left to drain per urology.  10. Acute on chronic renal failure: Improved but slowly trending up with SCr 1.48 --continue to monitor 11. OSA: Encourage CPAP use 12. Hypotension: Continue Midodrine  15  mg TID and Florinef  2 mg daily             --continues to have significant tachycardia with activity likely due to orthostasis and deconditioning.  -9/2 will need to continue working on acclimation   -volume/renal   at current baseline   -hgb 11.5   -added metoprolol  low dose 12.5mg  bid, bp soft but ok at present   -encourage PO intake 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    9/1 held reglan  14. Depression: On Zoloft  15. Malnutrition: Albumin  @1 .9/TP-5.1. Recheck Phos/Mg levels.  16. Anemia of chronic illness: Stable overall.   17.  Coughing with fluids  await SLP eval       LOS: 4 days A FACE TO FACE EVALUATION WAS PERFORMED  Kristopher Thompson 07/15/2024, 8:50 AM

## 2024-07-15 NOTE — Plan of Care (Signed)
  Problem: Consults Goal: RH GENERAL PATIENT EDUCATION Description: See Patient Education module for education specifics. Outcome: Progressing   Problem: RH BOWEL ELIMINATION Goal: RH STG MANAGE BOWEL WITH ASSISTANCE Description: STG Manage Bowel with supervision Assistance. Outcome: Progressing   Problem: RH BLADDER ELIMINATION Goal: RH STG MANAGE BLADDER WITH ASSISTANCE Description: STG Manage Bladder With supervision Assistance Outcome: Progressing   Problem: RH SKIN INTEGRITY Goal: RH STG SKIN FREE OF INFECTION/BREAKDOWN Description: Manage skin free of infection with supervision assistance Outcome: Progressing   Problem: RH SAFETY Goal: RH STG ADHERE TO SAFETY PRECAUTIONS W/ASSISTANCE/DEVICE Description: STG Adhere to Safety Precautions With Assistance/Device. Outcome: Progressing   Problem: RH PAIN MANAGEMENT Goal: RH STG PAIN MANAGED AT OR BELOW PT'S PAIN GOAL Description: <4 w/ prns Outcome: Progressing   Problem: RH KNOWLEDGE DEFICIT GENERAL Goal: RH STG INCREASE KNOWLEDGE OF SELF CARE AFTER HOSPITALIZATION Description: Manage increase knowledge of self care care after hospitalization with supervision assistance from wife using educational materials provided Outcome: Progressing

## 2024-07-15 NOTE — Progress Notes (Signed)
 Occupational Therapy Session Note  Patient Details  Name: Kristopher Thompson MRN: [id number] Date of Birth: 1951/09/15  Session 1 Today's Date: 07/15/2024 OT Individual Time: 9264-9179 OT Individual Time Calculation (min): 45 min    Session 2  Today's Date: 07/15/2024 OT Individual Time: 8698-8588 OT Individual Time Calculation (min): 70 min    Short Term Goals: Week 1:  OT Short Term Goal 1 (Week 1): The pt will come from supine in bed to EOB with MinA  using AE as needed. OT Short Term Goal 2 (Week 1): The pt will complete UB/LB bathing at [facility] incorporating AE as needed. OT Short Term Goal 3 (Week 1): The pt will dress UB/LB with ModA incorporating AE as needed. OT Short Term Goal 4 (Week 1): The pt will improve activity tolerance to good 3 of 5 occurences. OT Short Term Goal 5 (Week 1): The pt will safely improve core strength to 3+/5 MMT.  Skilled Therapeutic Interventions/Progress Updates:    Session 1 Pt received supine with no c/o pain, agreeable to OT session. He came to EOB with max A for trunk and LE management. He required min cueing/assist to position himself EOB to manage static sitting balance. He was then able to maintain sitting balance with close (S) for several minutes. He completed 3x sit > stands from EOB with max A. Heavy cueing and demonstration for forward trunk weight shift and edu on head-hips relationship. Pt unable to come into fully erect trunk, requiring facilitation from OT and cueing. Rest breaks required between each stand. Used stedy for transfer to the wc- max A to stand and pt getting quickly SOB stating just get me there. HR between 90-111 bpm throughout session. BP 110/74 once in the chair. He completed oral care with set up assist seated. Pt was left sitting up in the wheelchair with all needs met and call bell within reach.    Session 2 Pt received sitting in the w/c with no c/o pain at rest. Assisted his wife in emptying ileostomy and urostomy. Pt  requested to change shirt but declined bathing d/t being cold. He required min A to pull down posteriorly. Pt was taken via w/c to the therapy gym for time management. Patient required increased time for initiation, cuing, rest breaks, and for completion of tasks throughout session. Utilized therapeutic use of self throughout to promote efficiency. He was taken into the parallel bars. He worked on sit > stands with focus on forward trunk weight shift and head-hips relationship to facilitate stand. He worked on numerous repetitions, requiring heavy max A to come into standing. Addressed prolonged standing tolerance (max 30 seconds), as well as progressing to weight shifting R/L for pre-gait/ ADL transfers. His final trial he was able to complete 3 mini steps forward, barely clearing BLE but fully weightshifting. His HR was stable throughout session around 90 bpm. He ended with BUE strengthening circuit using a 3 lb dowel. He completed bicep curls and overhead reach in closed chain- 3x8 repetitions. He returned to his room following and was left sitting up in the w/c with all needs met.    Therapy Documentation Precautions:  Precautions Precautions: Fall (HOH, Urostomy, Colostomy, PCN  drain, and orthostatic hypotension.) Recall of Precautions/Restrictions: Impaired Precaution/Restrictions Comments: urostomy, colostomy, lt PCN drain, watch BP and HR Restrictions Weight Bearing Restrictions Per Provider Order: No  Therapy/Group: Individual Therapy  [name] 07/15/2024, 8:13 AM

## 2024-07-15 NOTE — Progress Notes (Signed)
 Nutrition Follow Up  DOCUMENTATION CODES:  Non-severe (moderate) malnutrition in context of chronic illness (bladder cancer, tachycardia, hx bowel perforation)  INTERVENTION:  Diet recommendations per SLP assessment Currently on dysphagia 3 w/ thins Ensure Plus High Protein po BID, each supplement provides 350 kcal and 20 grams of protein. Juven BID to support wound healing MVI with minerals daily Monitor ostomy output  NUTRITION DIAGNOSIS:  Moderate Malnutrition related to chronic illness (bladder cancer, tachycardia,  hx bowel perforation) as evidenced by moderate fat depletion, moderate muscle depletion, percent weight loss.  GOAL:  Patient will meet greater than or equal to 90% of their needs  MONITOR:  PO intake, Supplement acceptance, Weight trends  REASON FOR ASSESSMENT:  Consult Assessment of nutrition requirement/status  ASSESSMENT:  Pt admitted to CIR with debility following most recent admission for fatigue, mental status changes, tachycardia, hypotension and purulent drainage from abdominal wall drain. PMH significant for HFpEF, CAD, T2DM, PE on eliquis , prostate cancer, bladder cancer s/p cystoprostatectomy and ileal conduit (2013), recent admission 04/10/24 for parastomal-incisional abdominal wall hernia repair w/graft and ileal conduit reversal c/b intra-abdominal and abdominal wall abscess from bowel perforation s/p end ileostomy.  Recent Admissions: 5/30-7/11: bowel perforation s/p SBR/end ileostomy 7/14-7/16: AKI, infection of ileostomy site 7/16-8/8: Admitted to CIR 8/20-8/30: Admitted to [facility]: AKI, severe sepsis  Current Admission: 8/30 admitted to CIR 9/3 MBS, recommended DYS 3 w/ thins  Spoke with pt and pt's wife who was at bedside. Pt reports poor appetite during admission and previously prior to admission. Diet summary documentation shows average 43% intake over last 6 recorded meals. Pt reports he does not feel like eating and does not feel hungry. Pt  currently prescribed midodrine  to help with appetite. Ileostomy output remains high and is very loose, prescribed psyllium and discussed foods he can eat to bulk output. Attached ileostomy nutrition therapy handout to AVS for wife's reference.   Pt has been in and out of hospital/rehab multiple times since May. Wife reports appetite has been subpar throughout this whole time and pt would only eat bites of meals when he was at home. Otherwise, pt has been at [facility] or hospital where he complains of the taste of the food. Encouraged wife to bring in any foods pt is craving to help with intake. Pt currently drinking [employer] shakes since pt does not like Ensure, recommended continuing. Encouraged pt to order anything that sounds good to him to help with intake. Pt denied wanting snacks in between meals.  Physical exams shows mild to moderate fat depletion and moderate to severe muscle depletion. Severe muscle depletion seen in legs which is likely due to atrophy from prolonged hospital stays and will not be used towards malnutrition diagnosis. However upper extremity losses are new upon this admission which correlate to poor po intake during last 3 months and significant wt loss 19.7% in 3 months which is indicative of chronic malnutrition.   Will continue Juven to support wound healing and prevent skin breakdown.   Average Meal Completion: 8/30-9/2: 43% average intake x 6 recorded meals  Medications:  Vitamin B12 500 mcg Imodium  TID Midodrine  TID MVI w/ minerals Juven BID Lovaza   Psyllium  Zoloft   Ceftriaxone / Vancomycin    Labs:  BUN 41 Cr 1.52 GFR 48  Ileostomy: 1350 mL x 36 hr Urostomy:  1050 mL  NUTRITION - FOCUSED PHYSICAL EXAM: Flowsheet Row Most Recent Value  Orbital Region Mild depletion  Upper Arm Region Moderate depletion  Thoracic and Lumbar Region Mild depletion  Buccal Region Moderate depletion  Temple Region Moderate depletion  Clavicle Bone Region Mild  depletion  Clavicle and Acromion Bone Region Mild depletion  Scapular Bone Region Mild depletion  Dorsal Hand Moderate depletion  Patellar Region Severe depletion  Anterior Thigh Region Severe depletion  Posterior Calf Region Severe depletion  Edema (RD Assessment) None  Hair Reviewed  Eyes Reviewed  Mouth Reviewed  Skin Reviewed  Nails Reviewed     Diet Order:   Diet Order             DIET DYS 3 Room service appropriate? Yes; Fluid consistency: Thin  Diet effective now                   EDUCATION NEEDS:  Education needs have been addressed  Skin:  Skin Assessment: Reviewed RN Assessment (open abdominal surgical incision)  Last BM:  1350 mL ileostomy output x 36 hr  Height:  Ht Readings from Last 1 Encounters:  07/12/24 5' 11 (1.803 m)    Weight:  Wt Readings from Last 1 Encounters:  07/11/24 87.5 kg   BMI:  Body mass index is 26.9 kg/m.  Estimated Nutritional Needs:   Kcal:  2200-2400  Protein:  120-135g  Fluid:  >/=2L  Josette Glance, MS, RDN, LDN Clinical Dietitian I Please reach out via secure chat

## 2024-07-15 NOTE — Consult Note (Signed)
 Neuropsychological Consultation Comprehensive Inpatient Rehab   Patient:   Kristopher Thompson   DOB:   Nov 25, 1950  MR Number:  [id number]  Location:  [facility] [address] [place] [address] Dept: [phone] Loc: [phone]           Date of Service:   07/15/2024  Start Time:   3 PM End Time:   3:30 PM  Provider/Observer:  Norleen Asa, Psy.D.       Clinical Neuropsychologist       Billing Code/Service: [phone]  Reason for Service:    [name] is a  73 year old male admitted to the comprehensive inpatient rehabilitation unit following a recent discharge from CIR and now return.  I saw the patient during past CIR admission. Readmitted due to debilitating status with superimposed cognitive deficits. Significant complex medical history includes gastrointestinal issues and wound infection.  Past medical history: Obstructive sleep apnea, congestive heart failure with preserved ejection fraction, coronary artery disease, type 2 diabetes, previous pulmonary embolism (on anticoagulants), and a history of prostate and bladder cancer status post cystoprostatectomy with ileal conduit. Previous admission in May for abdominal wall hernia repair with graft, perforation, complicated by acute on chronic renal failure and subsequent infection.  Cognitive status is impaired with deficits in information processing speed, verbal output, and ability to follow multi-step commands. Currently oriented and states that he remembers me from previous admission to CIR.  Today, the patient reports feeling better than on admission but notes intermittent back pain. Appetite is poor with nausea after a few bites of food, leading to one episode of emesis. Denies wanting to avoid eating but feels physically unable to. Mood appears stable per collateral information from wife, consistent with his baseline personality. Expresses  gratitude for being alive. Acknowledges the complexity of his care with multiple specialists involved.  No specific requests for changes to his care plan. Aware of busy periods on the ward, noting 06:30-08:30 is a difficult time to get assistance. Has been sitting up out of bed for several hours without increased pain.  Medical History:   Past Medical History:  Diagnosis Date   At risk for sleep apnea    12-25-2017   STOP-BANG SCORE= 5   --- SENT TO PCP   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   moderate atypia - right low back   Atypical nevus 04/04/2007   moderate to marked - right upper back (wider shave)   Atypical nevus 04/04/2007   moderate atypia - center chest (wider shave)   Atypical nevus 04/04/2007   slight atypia - right thigh   Atypical nevus 11/29/2011   mild atypia - center upper back   Atypical nevus 11/29/2011   mild atypia - center chest   Bacteremia due to Klebsiella pneumoniae 10/09/2017   Bladder cancer (HCC) dx 07/2017   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  s/p  cystectomy w/ ileal conduit urinary diversion   Candida infection    CHF (congestive heart failure) (HCC)    Colostomy in place (HCC)    since 08-08-2017-- per pt 12-25-2017 reddness around stoma   Diabetes mellitus without complication (HCC)    GERD (gastroesophageal reflux disease)    H/O hiatal hernia    History of sepsis 09/2017   dx bacteremia due to klebsiella pneumoniae,  post op intraabdominal abscess   Prostate cancer [facility]) urologist-- dr renda   10-02-2012 s/p  prostatectomy-- Stage T1c   RBBB  Renal disorder    pt. denies   Sleep apnea    cpap   Squamous cell carcinoma of skin 05/22/2013   left cheek - CX3 + 5FU   Wears glasses          Patient Active Problem List   Diagnosis Date Noted   Cognitive and behavioral changes 07/15/2024   Gastro-esophageal reflux disease with esophagitis 07/08/2024   Hyponatremia 07/02/2024   Adjustment disorder with depressed mood 06/01/2024    Critical illness myopathy 05/27/2024   High output ileostomy (HCC) 05/26/2024   CKD stage 3b, GFR 30-44 ml/min (HCC) 05/26/2024   Ileostomy in place [facility]) 05/20/2024   H/O insertion of nephrostomy tube 05/20/2024   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 05/11/2024   Delayed bowel perforation s/p SBR/end ileostomy 04/29/2024   Pressure injury of skin 04/29/2024   Sinus tachycardia 04/13/2024   Tachypnea 04/13/2024   Acute respiratory insufficiency, postoperative 04/13/2024   Sepsis due to undetermined organism (HCC) 04/13/2024   Lactic acidosis 04/13/2024   Class 2 obesity 04/13/2024   Incarcerated incisional hernia 04/10/2024   Chronic anticoagulation 03/02/2024   Personal history of PE (pulmonary embolism) 03/02/2024   Irritant contact dermatitis associated with fecal stoma 05/28/2023   Non-recurrent bilateral inguinal hernia without obstruction or gangrene 05/28/2023   Obstructive sleep apnea of adult 11/21/2022   Post-traumatic arthritis of ankle, 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 his dress was Appropriate and he was Well Groomed and his manners were Appropriate to the situation.  his participation was indicative of Appropriate, Inattentive, and Redirectable behaviors.  There were physical disabilities noted.  he displayed an appropriate level of cooperation and motivation.     Interactions:    Minimal Inattentive and Redirectable  Attention:   abnormal and attention span appeared shorter than expected for age  Memory:   abnormal; global memory impairment noted  Visuo-spatial:   not examined  Speech (Volume):  low  Speech:   normal; normal patient is hard of hearing  Thought Process:  Coherent and Circumstantial  Concrete and Coherent  Though Content:  WNL; not suicidal and not homicidal  Orientation:   person, place, and situation  Judgment:   Fair  Planning:   Poor  Affect:    Blunted, Flat, and Lethargic  Mood:    Dysphoric  Insight:   Fair  Intelligence:   normal  Family Med/Psych History:  Family History  Problem Relation Age of Onset   Lung cancer Mother    Hypertension Father    Colon cancer Other    CAD Neg Hx    Diabetes Neg Hx    Stroke Neg Hx     Impression/DX:   Cognitive status is impaired with deficits in information processing speed, verbal output, and ability to follow multi-step commands. Currently oriented and states that he remembers aspects of past and current CIR efforts but was able to give little details.  Patient states  he is glad that he is still alive and motivated to continue to work on therapeutic improvements with significant debilitating status.  Disposition/Plan:   Provided psychoeducation on the normalcy of poor appetite given his medical condition and medications. Encouraged small, frequent intake. Explained the rationale for mobilization to prevent deconditioning and promote gut motility. Advised on strategies for managing sleep interruptions. Reinforced the importance of asking questions to the care team and clarified the different roles of nurses and providers. Will continue to monitor and provide support as needed.         Electronically Signed   _______________________ Norleen Asa, Psy.D. Clinical Neuropsychologist

## 2024-07-15 NOTE — Progress Notes (Signed)
 Physical Therapy Session Note  Patient Details  Name: Kristopher Thompson MRN: [id number] Date of Birth: 12-29-1950  Today's Date: 07/15/2024 PT Individual Time: 1100-1200 PT Individual Time Calculation (min): 60 min   Short Term Goals: Week 1:  PT Short Term Goal 1 (Week 1): pt will perform bed mobility with min A with LRAD PT Short Term Goal 2 (Week 1): pt will perform sit to stand with mod A or less PT Short Term Goal 3 (Week 1): pt will initiate gait training  Skilled Therapeutic Interventions/Progress Updates:   Pt received supine in bed, agreeable to therapy and denied any pain t/o session. Pt affect flat for the most part, but reasonable.   Transfers & Bed Mobility:  L roll: max A pulling trunk and hips, pt able to contribute via RUE on bed rail Supine to sit: max A for BLE management and trunk elevation Squat pivot to WC: max A w/ B knee block Sit to stand: max A +2 w/ RW Interventions:  Pt wheeled outside to improve pt affect/mood, begin community reintegration. Pt performed several sit to stands w/ max A from [facility] but was unsuccessful in coming to full stand. Pt required +2 to complete, cued to look forward, straighten knees, and bring hips forward w/ fair but temporary carryover. Pt required increased rest breaks between attempts. Pt able to self-propel manual WC ~41ft outside over unlevel surfaces, 51ft indoors w/ min-modA pushing assist, righting assist to keep straight (veers left). Pt wheeled back to room and given call bell and tray table. All other needs in reach.   Therapy Documentation Precautions:  Precautions Precautions: Fall (HOH, Urostomy, Colostomy, PCN  drain, and orthostatic hypotension.) Recall of Precautions/Restrictions: Impaired Precaution/Restrictions Comments: urostomy, colostomy, lt PCN drain, watch BP and HR Restrictions Weight Bearing Restrictions Per Provider Order: No   Therapy/Group: Individual Therapy  [name], SPT Elsie JAYSON Dawn, PT,  DPT 07/15/2024, 12:29 PM

## 2024-07-15 NOTE — Procedures (Signed)
 Modified Barium Swallow Study  Patient Details  Name: Kristopher Thompson MRN: [id number] Date of Birth: [birth year]  Today's Date: 07/15/2024  Modified Barium Swallow completed.  Full report located under Chart Review in the Imaging Section.  History of Present Illness Pt is a 73 y.o. male admitted to CIR following hospitalization for debility. PMHx significant for: h/o hernia s/p repair in May 2025, GERD, colostomy, sepsia, sleep apnea, DM II, renal disorder, PE, CHF, prostate cancer, bladder cancer. Chart review shows: pt previously admitted back in late May for incarcerated incisional hernia and urostomy ileal conduit revision.  Postoperative course was complicated by bowel perforation, sepsis, ICU admission with intubation, requiring pressors and CRRT. Acute rehab SLP assessed 6/7 with recommendations to initiate Dys 2 diet with thin liquids as well as aspiration and esophageal precautions. Returned to OR 6/9 for perforated bowel, post op shock, s/p exploratory lapartomy with drainage of R abdominal wall abscess on 6/9. CRRT 6/11-6/14, restarted 6/17-6/20. Extubated 6/14. Transferred to [facility] 6/15 for intermittent HD. He was stabilized and discharged to SNF on 7/11 but returned at the request of SNF MD due to increased output of ostomy therefore he was directly admitted to [facility] on 05/25/24. MBSS recommended after bedside swallow eval 8/31.   Clinical Impression Pt presented w/ mild oropharyngeal dysphagia. Piecemeal deglutition negatively impacted timely oral clearance, though oral strength overall WFL. Pharyngeal phase mildly inefficient overall d/t reduced pharyngeal constriction and reduced vestibular closure negatively impacted airway protection. He required multiple swallows to clear scattered pharyngeal residue to trace amounts. One instance of silent aspiration noted w/ thin liquids via cup. No other instances of aspiration noted w/ thin liquids via straw. Unable to  complete any solid texture trials d/t pt nausea/emesis. Recommend cont Dys 3 textures and thin liquids via straw. Double swallow to clear residue. He would benefit from introduction of pharyngeal strengthening exercises in upcoming tx sessions to assist w/ efficiency of pharyngeal clearance. Recommend cont ST per POC.     Factors that may increase risk of adverse event in presence of aspiration Noe [employer] Lianne 2021): Frail or deconditioned;Limited mobility;Reduced cognitive function;Weak cough  Swallow Evaluation Recommendations Recommendations: PO diet PO Diet Recommendation: Thin liquids (Level 0);Dysphagia 3 (Mechanical soft) Liquid Administration via: Straw Medication Administration: Whole meds with liquid Supervision: Patient able to self-feed Swallowing strategies  : Minimize environmental distractions;Slow rate;Small bites/sips;Multiple dry swallows after each bite/sip Postural changes: Position pt fully upright for meals;Stay upright 30-60 min after meals Oral care recommendations: Oral care BID (2x/day) Recommended consults: Consider GI consultation;Consider dietitian consultation Caregiver Recommendations: Have oral suction available      [name] 07/15/2024,10:02 AM

## 2024-07-16 LAB — BASIC METABOLIC PANEL WITH GFR
Anion gap: 13 (ref 5–15)
BUN: 40 mg/dL — ABNORMAL HIGH (ref 8–23)
CO2: 17 mmol/L — ABNORMAL LOW (ref 22–32)
Calcium: 9.9 mg/dL (ref 8.9–10.3)
Chloride: 107 mmol/L (ref 98–111)
Creatinine, Ser: 1.85 mg/dL — ABNORMAL HIGH (ref 0.61–1.24)
GFR, Estimated: 38 mL/min — ABNORMAL LOW (ref >60)
Glucose, Bld: 82 mg/dL (ref 70–99)
Potassium: 3.6 mmol/L (ref 3.5–5.1)
Sodium: 137 mmol/L (ref 135–145)

## 2024-07-16 LAB — CBC
HCT: 34.7 % — ABNORMAL LOW (ref 39.0–52.0)
Hemoglobin: 11.5 g/dL — ABNORMAL LOW (ref 13.0–17.0)
MCH: 27.2 pg (ref 26.0–34.0)
MCHC: 33.1 g/dL (ref 30.0–36.0)
MCV: 82 fL (ref 80.0–100.0)
Platelets: 303 K/uL (ref 150–400)
RBC: 4.23 MIL/uL (ref 4.22–5.81)
RDW: 14.7 % (ref 11.5–15.5)
WBC: 8.8 K/uL (ref 4.0–10.5)
nRBC: 0 % (ref 0.0–0.2)

## 2024-07-16 LAB — VANCOMYCIN, RANDOM: Vancomycin Rm: 28 ug/mL

## 2024-07-16 MED ORDER — CYPROHEPTADINE HCL 4 MG PO TABS
4.0000 mg | ORAL_TABLET | Freq: Three times a day (TID) | ORAL | Status: DC
  Administered 2024-07-16 – 2024-07-20 (×14): 4 mg via ORAL
  Filled 2024-07-16 (×16): qty 1

## 2024-07-16 NOTE — Plan of Care (Signed)
  Problem: Consults Goal: RH GENERAL PATIENT EDUCATION Description: See Patient Education module for education specifics. Outcome: Progressing   Problem: RH BOWEL ELIMINATION Goal: RH STG MANAGE BOWEL WITH ASSISTANCE Description: STG Manage Bowel with supervision Assistance. Outcome: Progressing   Problem: RH BLADDER ELIMINATION Goal: RH STG MANAGE BLADDER WITH ASSISTANCE Description: STG Manage Bladder With supervision Assistance Outcome: Progressing   Problem: RH SKIN INTEGRITY Goal: RH STG SKIN FREE OF INFECTION/BREAKDOWN Description: Manage skin free of infection with supervision assistance Outcome: Progressing   Problem: RH SAFETY Goal: RH STG ADHERE TO SAFETY PRECAUTIONS W/ASSISTANCE/DEVICE Description: STG Adhere to Safety Precautions With Assistance/Device. Outcome: Progressing   Problem: RH PAIN MANAGEMENT Goal: RH STG PAIN MANAGED AT OR BELOW PT'S PAIN GOAL Description: <4 w/ prns Outcome: Progressing   Problem: RH KNOWLEDGE DEFICIT GENERAL Goal: RH STG INCREASE KNOWLEDGE OF SELF CARE AFTER HOSPITALIZATION Description: Manage increase knowledge of self care care after hospitalization with supervision assistance from wife using educational materials provided Outcome: Progressing

## 2024-07-16 NOTE — Progress Notes (Signed)
 Physical Therapy Session Note  Patient Details  Name: Kristopher Thompson MRN: [id number] Date of Birth: 04-01-1951  Today's Date: 07/16/2024 PT Individual Time: 1005-1100 PT Individual Time Calculation (min): 55 min   Today's Date: 07/16/2024 PT Individual Time: 1418-1530 PT Individual Time Calculation (min): 72 min   Short Term Goals: Week 1:  PT Short Term Goal 1 (Week 1): pt will perform bed mobility with min A with LRAD PT Short Term Goal 2 (Week 1): pt will perform sit to stand with mod A or less PT Short Term Goal 3 (Week 1): pt will initiate gait training  Skilled Therapeutic Interventions/Progress Updates:     1st Session:Pt received seated in [facility] and agrees to therapy. No complaint of pain. WC transport to gym for time management. Pt performs sit to stand with Stedy, requiring heavy maxA to promote anterior weight shifting and powering up. Pt noted to have gaze directly downward, impairing optimal motor planning and body mechanics to complete transfer. PT provides pt with mirror to provide visual cues to promote upward gaze. Pt responds that he always looks down to get up. Pt positioned in high perch on Stedy and to challenge core strength and balance. Pt able to remain up in stedy for ~2-3 minutes prior to requesting to return to sitting in WC. Following extended seated rest break, pt stands with improved efficiency, thoug still requires maxA. Pt tolerates high perched position for ~30 seconds, requesting to sit back down due to legs feeling fatigued. Extended seated rest break. Pt self propels WC to work on upper extremity endurance, activity tolerance, and mobility training. Pt utilizes extremely short pushes on rims and PT provides cues to increase shoulder extension for improved efficiency. Pt takes rest break and PT challenges pt to propel WC as fast as possible for 10' to gauge efficiency. Pt covers distance in 35 seconds, or 3.5 seconds per foot. WC transport back to room. MaxA +2  for stand pivot back to bed. ToalA +1 for sit to supine. Left semi reclined with all needs within reach.   2nd Session: Pt received seated in [facility] and agrees to therapy. No complaint of pain. WC transport to gym. Pt performs LAQs for knee extensor strengthening, and lacks ~15 degrees of extension in Bilateral knees secondary to hamstring tightness. PT provides manual stretch of hamstrings with prolonged hold at end range extension. Pt performs stand pivot transfer to Nustep with totalA and cues for initiation, posture and sequencing. Pt noted to have heavy Rt sided and posterior bias and has very limited hip or trunk extension during transfer. Pt completes Nustep for endurance training and strengthening. Pt  completes total of 8:00 active time at workload of 3 and average steps per minute ~15. PT provides cues for hand and foot placement and completing full available ROM. Stand pivot from Nustep to [facility] with maxA +2 and same cues. Pt then performs transfer training in parallel bars. Pt stands with maxA and heavy facilitation of hip extension with bilateral knees blocked. Pt able remain in static standing with maxA for ~15 seconds at a time. Pt completes x3 stands with very extended rest breaks between each bout. WC transport back to room. MaxA +2 for stand pivot back to bed. ToalA +1 for sit to supine. Left semi reclined with all needs within reach.   Therapy Documentation Precautions:  Precautions Precautions: Fall (HOH, Urostomy, Colostomy, PCN  drain, and orthostatic hypotension.) Recall of Precautions/Restrictions: Impaired Precaution/Restrictions Comments: urostomy, colostomy, lt PCN drain, watch BP  and HR Restrictions Weight Bearing Restrictions Per Provider Order: No  Therapy/Group: Individual Therapy  Elsie JAYSON Dawn, PT, DPT 07/16/2024, 4:02 PM

## 2024-07-16 NOTE — Progress Notes (Signed)
 Pharmacy Antibiotic Note  Kristopher Thompson is a 73 y.o. male admitted on 07/11/2024 with intra- abdominal wall infection.  Pharmacy has been consulted for vancomycin  dosing (he is also on Rocephin ).  SCr is up today to 1.85. Afeb, WBC down to normal.  -ID also following: continuing antibiotics until 10/2  Plan: -Continue vancomycin  1000 mg IV q48h    - goal AUC 400-550 -Will follow renal function and clinical progress -Check vancomycin  levels with change in SCr   Height: 5' 11 (180.3 cm) Weight: 87.5 kg (192 lb 14.4 oz) IBW/kg (Calculated) : 75.3  /Temp (24hrs), Avg:97.7 F (36.5 C), Min:97.6 F (36.4 C), Max:97.8 F (36.6 C)  Recent Labs  Lab 07/09/24 1557 07/11/24 0300 07/12/24 0622 07/13/24 0546 07/15/24 1002 07/16/24 0513  WBC  --   --  11.9* 10.3  --  8.8  CREATININE  --   --  1.57* 1.52*  --  1.85*  VANCOTROUGH 39*  --   --   --  20  --   VANCORANDOM  --  23  --  22  --   --     Estimated Creatinine Clearance: 38.4 mL/min (A) (by C-G formula based on SCr of 1.85 mg/dL (H)).    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   Bascom JAYSON Louder, PharmD 07/16/2024 2:14 PM  **Pharmacist phone directory can be found on [email or web address] listed under [facility]**

## 2024-07-16 NOTE — Progress Notes (Signed)
 Occupational Therapy Session Note  Patient Details  Name: Kristopher Thompson MRN: [id number] Date of Birth: Mar 29, 1951  Today's Date: 07/16/2024 OT Individual Time: 9154-9069 OT Individual Time Calculation (min): 45 min    Short Term Goals: Week 1:  OT Short Term Goal 1 (Week 1): The pt will come from supine in bed to EOB with MinA  using AE as needed. OT Short Term Goal 2 (Week 1): The pt will complete UB/LB bathing at [facility] incorporating AE as needed. OT Short Term Goal 3 (Week 1): The pt will dress UB/LB with ModA incorporating AE as needed. OT Short Term Goal 4 (Week 1): The pt will improve activity tolerance to good 3 of 5 occurences. OT Short Term Goal 5 (Week 1): The pt will safely improve core strength to 3+/5 MMT.  Skilled Therapeutic Interventions/Progress Updates:    Patient received supine in bed - wife at bedside.  Patient's hearing aides charging - so unable to use this session.  Patient agreeable to get up out of bed.  Patient able to walk legs toward edge of bed., but had difficulty motor planning rolling onto his side.  Patient with posterior bias in sitting.  Patient required max physical assistance to transfer to chair.  Strong posterior bias, inability to achieve upright control.  Patient unable to step feet toward chair - opted for pivot transfer.  Patient completed oral care at sink after set up and intermittent min assist.  Patient left up in wheelchair with personal items and call bell in reach.    Therapy Documentation Precautions:  Precautions Precautions: Fall (HOH, Urostomy, Colostomy, PCN  drain, and orthostatic hypotension.) Recall of Precautions/Restrictions: Impaired Precaution/Restrictions Comments: urostomy, colostomy, lt PCN drain, watch BP and HR Restrictions Weight Bearing Restrictions Per Provider Order: No   Pain:  Denies pain    Therapy/Group: Individual Therapy  Tija Biss M 07/16/2024, 3:37 PM

## 2024-07-16 NOTE — Progress Notes (Signed)
 Occupational Therapy Session Note  Patient Details  Name: Kristopher Thompson MRN: [id number] Date of Birth: [birth year]  Today's Date: 07/16/2024 OT Individual Time: 1300-1405 OT Individual Time Calculation (min): 65 min    Short Term Goals: Week 1:  OT Short Term Goal 1 (Week 1): The pt will come from supine in bed to EOB with MinA  using AE as needed. OT Short Term Goal 2 (Week 1): The pt will complete UB/LB bathing at [facility] incorporating AE as needed. OT Short Term Goal 3 (Week 1): The pt will dress UB/LB with ModA incorporating AE as needed. OT Short Term Goal 4 (Week 1): The pt will improve activity tolerance to good 3 of 5 occurences. OT Short Term Goal 5 (Week 1): The pt will safely improve core strength to 3+/5 MMT.  Skilled Therapeutic Interventions/Progress Updates:  Patient agreeable to participate in OT session. Reports no pain level.   Patient participated in skilled OT session focusing on transfer training, UE strengthening, postural alignment, and sit to stands. Patient found in supine with HOB elevated. Completed bed mobility mod to max A. Min A for static siting on EOB. Patient completed balance activities in wc without arm rests and no back support to increase postural stability. Required max verbal cues and min A. Patient completed UE strengthening for scapular retraction for increased posture. Completed sit to stand in parallel bars 2x. First attempt mod A, second attempt max A. Returned to room all needs in reach alarm on.    Therapy Documentation Precautions:  Precautions Precautions: Fall (HOH, Urostomy, Colostomy, PCN  drain, and orthostatic hypotension.) Recall of Precautions/Restrictions: Impaired Precaution/Restrictions Comments: urostomy, colostomy, lt PCN drain, watch BP and HR Restrictions Weight Bearing Restrictions Per Provider Order: No  Therapy/Group: Individual Therapy  Kristopher Thompson 07/16/2024, 7:57 AM

## 2024-07-16 NOTE — Progress Notes (Addendum)
 PROGRESS NOTE   Subjective/Complaints: Still without much appetite. HR/BP better with activity although bp remains soft. No new pain. Still feels generally weak  ROS: Patient denies fever, rash, sore throat, blurred vision, dizziness, nausea, vomiting, diarrhea, cough, shortness of breath or chest pain, joint or back/neck pain, headache, or mood change.    Objective:   DG Swallowing Func-Speech Pathology Result Date: 07/15/2024 Table formatting from the original result was not included. Modified Barium Swallow Study Patient Details Name: Kristopher Thompson MRN: [id number] Date of Birth: 1951-09-08 Today's Date: 07/15/2024 HPI/PMH: HPI: Pt is a 73 y.o. male admitted to CIR following hospitalization for debility. PMHx significant for: h/o hernia s/p repair in May 2025, GERD, colostomy, sepsia, sleep apnea, DM II, renal disorder, PE, CHF, prostate cancer, bladder cancer. Chart review shows: pt previously admitted back in late May for incarcerated incisional hernia and urostomy ileal conduit revision.  Postoperative course was complicated by bowel perforation, sepsis, ICU admission with intubation, requiring pressors and CRRT. Acute rehab SLP assessed 6/7 with recommendations to initiate Dys 2 diet with thin liquids as well as aspiration and esophageal precautions. Returned to OR 6/9 for perforated bowel, post op shock, s/p exploratory lapartomy with drainage of R abdominal wall abscess on 6/9. CRRT 6/11-6/14, restarted 6/17-6/20. Extubated 6/14. Transferred to [facility] 6/15 for intermittent HD. He was stabilized and discharged to SNF on 7/11 but returned at the request of SNF MD due to increased output of ostomy therefore he was directly admitted to [facility] on 05/25/24. MBSS recommended after bedside swallow eval 8/31. Clinical Impression: Clinical Impression: Pt presented w/ mild oropharyngeal dysphagia. Piecemeal deglutition negatively  impacted timely oral clearance, though oral strength overall WFL. Pharyngeal phase mildly inefficient overall d/t reduced pharyngeal constriction and reduced vestibular closure negatively impacted airway protection. He required multiple swallows to clear scattered pharyngeal residue to trace amounts. One instance of silent aspiration noted w/ thin liquids via cup. No other instances of aspiration noted w/ thin liquids via straw. Unable to complete any solid texture trials d/t pt nausea/emesis. Recommend cont Dys 3 textures and thin liquids via straw. Double swallow to clear residue. He would benefit from introduction of pharyngeal strengthening exercises in upcoming tx sessions to assist w/ efficiency of pharyngeal clearance. Recommend cont ST per POC. Factors that may increase risk of adverse event in presence of aspiration Noe & Lianne 2021): Factors that may increase risk of adverse event in presence of aspiration Noe & Lianne 2021): Frail or deconditioned; Limited mobility; Reduced cognitive function; Weak cough Recommendations/Plan: Swallowing Evaluation Recommendations Swallowing Evaluation Recommendations Recommendations: PO diet PO Diet Recommendation: Thin liquids (Level 0); Dysphagia 3 (Mechanical soft) Liquid Administration via: Straw Medication Administration: Whole meds with liquid Supervision: Patient able to self-feed Swallowing strategies  : Minimize environmental distractions; Slow rate; Small bites/sips; Multiple dry swallows after each bite/sip Postural changes: Position pt fully upright for meals; Stay upright 30-60 min after meals Oral care recommendations: Oral care BID (2x/day) Recommended consults: Consider GI consultation; Consider dietitian consultation Caregiver Recommendations: Have oral suction available Treatment Plan Treatment Plan Treatment recommendations: Therapy as outlined in treatment plan below Follow-up recommendations: Acute inpatient rehab (3 hours/day)  Recommendations  Comment: Continue w/ current POC Functional status assessment: Patient has had a recent decline in their functional status and demonstrates the ability to make significant improvements in function in a reasonable and predictable amount of time. Treatment frequency: Min 5x/week Treatment duration: 2 weeks Interventions: Aspiration precaution training; Oropharyngeal exercises; Compensatory techniques; Patient/family education; Diet toleration management by SLP; Respiratory muscle strength training Recommendations Recommendations for follow up therapy are one component of a multi-disciplinary discharge planning process, led by the attending physician.  Recommendations may be updated based on patient status, additional functional criteria and insurance authorization. Assessment: Orofacial Exam: Orofacial Exam Oral Cavity: Oral Hygiene: WFL Oral Cavity - Dentition: Adequate natural dentition Orofacial Anatomy: WFL Oral Motor/Sensory Function: WFL Anatomy: Anatomy: WFL Boluses Administered: Boluses Administered Boluses Administered: Thin liquids (Level 0); Mildly thick liquids (Level 2, nectar thick); Puree  Oral Impairment Domain: Oral Impairment Domain Lip Closure: No labial escape Tongue control during bolus hold: Cohesive bolus between tongue to palatal seal Bolus transport/lingual motion: Delayed initiation of tongue motion (oral holding) Oral residue: Residue collection on oral structures; Trace residue lining oral structures Location of oral residue : Tongue; Palate Initiation of pharyngeal swallow : Posterior laryngeal surface of the epiglottis  Pharyngeal Impairment Domain: Pharyngeal Impairment Domain Soft palate elevation: No bolus between soft palate (SP)/pharyngeal wall (PW) Laryngeal elevation: Partial superior movement of thyroid  cartilage/partial approximation of arytenoids to epiglottic petiole Anterior hyoid excursion: Partial anterior movement Epiglottic movement: Partial inversion  Laryngeal vestibule closure: Incomplete, narrow column air/contrast in laryngeal vestibule Pharyngeal stripping wave : Present - diminished Pharyngeal contraction (A/P view only): N/A Pharyngoesophageal segment opening: Partial distention/partial duration, partial obstruction of flow Tongue base retraction: Trace column of contrast or air between tongue base and PPW Pharyngeal residue: Collection of residue within or on pharyngeal structures Location of pharyngeal residue: Pyriform sinuses; Pharyngeal wall; Valleculae; Tongue base; Aryepiglottic folds; Diffuse (>3 areas)  Esophageal Impairment Domain: Esophageal Impairment Domain Esophageal clearance upright position: Complete clearance, esophageal coating Pill: No data recorded Penetration/Aspiration Scale Score: Penetration/Aspiration Scale Score 2.  Material enters airway, remains ABOVE vocal cords then ejected out: Mildly thick liquids (Level 2, nectar thick) 8.  Material enters airway, passes BELOW cords without attempt by patient to eject out (silent aspiration) : Thin liquids (Level 0) Compensatory Strategies: Compensatory Strategies Compensatory strategies: Yes Straw: Effective Effective Straw: Thin liquid (Level 0); Mildly thick liquid (Level 2, nectar thick) Multiple swallows: Effective Effective Multiple Swallows: Thin liquid (Level 0); Mildly thick liquid (Level 2, nectar thick)   General Information: Caregiver present: No  Diet Prior to this Study: Thin liquids (Level 0); Dysphagia 3 (mechanical soft)   Temperature : Normal   Respiratory Status: WFL   Supplemental O2: None (Room air)   History of Recent Intubation: No  Behavior/Cognition: Alert; Cooperative; Pleasant mood Self-Feeding Abilities: Able to self-feed Baseline vocal quality/speech: Normal Volitional Cough: Able to elicit Volitional Swallow: Able to elicit Exam Limitations: Other (comment) (nausea) Goal Planning: Prognosis for improved oropharyngeal function: Good Barriers to Reach Goals:  Cognitive deficits; Motivation Barriers/Prognosis Comment: nausea/limited PO intake Patient/Family Stated Goal: to eat Consulted and agree with results and recommendations: Patient; Family member/caregiver Pain: Pain Assessment Pain Assessment: No/denies pain End of Session: Start Time:No data recorded Stop Time: No data recorded Time Calculation:No data recorded Charges: No data recorded SLP visit diagnosis: SLP Visit Diagnosis: Dysphagia, oropharyngeal phase (R13.12) Past Medical History: Past Medical History: Diagnosis Date  At risk for sleep apnea   12-25-2017   STOP-BANG SCORE= 5   ---  SENT TO PCP  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  moderate atypia - right low back  Atypical nevus 04/04/2007  moderate to marked - right upper back (wider shave)  Atypical nevus 04/04/2007  moderate atypia - center chest (wider shave)  Atypical nevus 04/04/2007  slight atypia - right thigh  Atypical nevus 11/29/2011  mild atypia - center upper back  Atypical nevus 11/29/2011  mild atypia - center chest  Bacteremia due to Klebsiella pneumoniae 10/09/2017  Bladder cancer (HCC) dx 07/2017  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  s/p  cystectomy w/ ileal conduit urinary diversion  Candida infection   CHF (congestive heart failure) (HCC)   Colostomy in place (HCC)   since 08-08-2017-- per pt 12-25-2017 reddness around stoma  Diabetes mellitus without complication (HCC)   GERD (gastroesophageal reflux disease)   H/O hiatal hernia   History of sepsis 09/2017  dx bacteremia due to klebsiella pneumoniae,  post op intraabdominal abscess  Prostate cancer [facility]) urologist-- dr renda  10-02-2012 s/p  prostatectomy-- Stage T1c  RBBB   Renal disorder   pt. denies  Sleep apnea   cpap  Squamous cell carcinoma of skin 05/22/2013  left cheek - CX3 + 5FU  Wears glasses  Past Surgical History: Past Surgical History: Procedure Laterality Date  ABDOMINAL SURGERY    APPENDECTOMY  1972  BOWEL RESECTION N/A 04/20/2024  Procedure: SMALL BOWEL RESECTION;  Surgeon:  [name], MD;  Location: [facility];  Service: General;  Laterality: N/A;  CHOLECYSTECTOMY  1985  COLONOSCOPY N/A 06/29/2021  Procedure: COLONOSCOPY;  Surgeon: Debby Hila, MD;  Location: [facility];  Service: Endoscopy;  Laterality: N/A;  COLOSTOMY REVERSAL N/A 01/08/2018  Procedure: COLOSTOMY REVERSAL;  Surgeon: Debby Hila, MD;  Location: [facility];  Service: General;  Laterality: N/A;  CYSTOSCOPY WITH RETROGRADE PYELOGRAM, URETEROSCOPY AND STENT PLACEMENT Right 06/10/2017  Procedure: CYSTOSCOPY WITH RIGHT URETEROSCOPY WITH RIGHT STENT PLACEMENT;  Surgeon: [name], MD;  Location: [facility];  Service: Urology;  Laterality: Right;  EUS N/A 04/16/2018  Procedure: FULL UPPER ENDOSCOPIC ULTRASOUND (EUS) RADIAL;  Surgeon: Burnette Fallow, MD;  Location: [facility];  Service: Endoscopy;  Laterality: N/A;  FLEXIBLE SIGMOIDOSCOPY N/A 12/13/2017  Procedure: FLEXIBLE SIGMOIDOSCOPY;  Surgeon: Debby Hila, MD;  Location: [facility];  Service: Endoscopy;  Laterality: N/A;  ILEO CONDUIT    IR CATHETER TUBE CHANGE  12/13/2017  IR CATHETER TUBE CHANGE  01/17/2018  IR CATHETER TUBE CHANGE  02/28/2018  IR CATHETER TUBE CHANGE  07/18/2018  IR CATHETER TUBE CHANGE  08/22/2018  IR CATHETER TUBE CHANGE  10/31/2018  IR CONVERT LEFT NEPHROSTOMY TO NEPHROURETERAL CATH  10/24/2017  IR EXT NEPHROURETERAL CATH EXCHANGE  05/19/2018  IR EXT NEPHROURETERAL CATH EXCHANGE  06/16/2018  IR EXT NEPHROURETERAL CATH EXCHANGE  07/09/2024  IR NEPHRO TUBE REMOV/FL  10/24/2017  IR NEPHROSTOGRAM LEFT THRU EXISTING ACCESS  12/05/2018  IR NEPHROSTOMY EXCHANGE LEFT  05/11/2024  IR NEPHROSTOMY EXCHANGE LEFT  07/09/2024  IR NEPHROSTOMY PLACEMENT LEFT  10/07/2017  IR NEPHROSTOMY PLACEMENT RIGHT  05/04/2024  IR NEPHROSTOMY TUBE CHANGE  04/11/2018  IR URETERAL STENT PLACEMENT EXISTING ACCESS LEFT  05/13/2024  IR URETERAL STENT RIGHT NEW ACCESS W/SEP NEPHROSTOMY CATH  05/13/2024  LAPAROTOMY N/A 04/20/2024  Procedure: EXPLORATORY LAPAROTOMY;  Surgeon: [name], MD;  Location:  [facility];  Service: General;  Laterality: N/A;  PARTIAL COLECTOMY N/A 04/21/2024  Procedure: COLECTOMY, PARTIAL;  Surgeon: [name], MD;  Location: [facility];  Service: General;  Laterality: N/A;  Removal Wound Vac, Washout Ostomy, Possible Anastomosis, Possible  Ileostomy.  Phasix Mesh.  POLYPECTOMY  06/29/2021  Procedure: POLYPECTOMY;  Surgeon: Debby Hila, MD;  Location: [facility];  Service: Endoscopy;;  ROBOT ASSISTED LAPAROSCOPIC RADICAL PROSTATECTOMY  10/02/2012  Procedure: ROBOTIC ASSISTED LAPAROSCOPIC RADICAL PROSTATECTOMY LEVEL 2;  Surgeon: Noretta Ferrara, MD;  Location: [facility];  Service: Urology;  Laterality: N/A;  ROBOTIC ASSISTED LAPAROSCOPIC BLADDER DIVERTICULECTOMY N/A 08/08/2017  Procedure: XI ROBOTIC ASSISTED LAPAROSCOPIC RADICAL CYSTECTOMY COVERTED TO OPEN PELVIC LYMPHADNECTOMY BILATERAL AND ILEAL CONDUIT URINARY DIVERSION;  Surgeon: Ferrara Glance, MD;  Location: [facility];  Service: Urology;  Laterality: N/A;  TRANSURETHRAL RESECTION OF BLADDER TUMOR  06/10/2017  Procedure: TRANSURETHRAL RESECTION OF BLADDER TUMOR (TURBT);  Surgeon: Ferrara Glance, MD;  Location: [facility];  Service: Urology;;  VACUUM ASSISTED CLOSURE CHANGE N/A 04/20/2024  Procedure: PLACEMENT OF CONCETTA CAPES;  Surgeon: [name], MD;  Location: [facility];  Service: General;  Laterality: N/A;  VENTRAL HERNIA REPAIR N/A 04/10/2024  Procedure: REPAIR, HERNIA, VENTRAL;  Surgeon: [name], MD;  Location: [facility];  Service: General;  Laterality: N/A;  XI ROBOT ABDOMINAL PERINEAL RESECTION N/A 08/08/2017  Procedure: REPAIR OF RECTAL TEAR POSSIBLE PARTIAL PROCTECTOMY, CREATION OF  OSTOMY;  Surgeon: Debby Hila, MD;  Location: [facility];  Service: General;  Laterality: N/A;  XI ROBOTIC ASSISTED PARASTOMAL HERNIA REPAIR N/A 04/10/2024  Procedure: REPAIR, HERNIA, PARASTOMAL AND VENTRAL HERNIAS, ROBOT-ASSISTED, LEFT INGUINAL HERNIA, LYSIS OF ADHESIONS INCARCERATED AND INSIONAL HERNIAS AND UROSTOMY REVISION;  Surgeon: [name], MD;  Location: [facility];  Service: General;  Laterality: N/AMERL Recardo DELENA Berna 07/15/2024, 1:10 PM  Recent Labs    07/16/24 0513  WBC 8.8  HGB 11.5*  HCT 34.7*  PLT 303   Recent Labs    07/16/24 0513  NA 137  K 3.6  CL 107  CO2 17*  GLUCOSE 82  BUN 40*  CREATININE 1.85*  CALCIUM  9.9    Intake/Output Summary (Last 24 hours) at 07/16/2024 1023 Last data filed at 07/16/2024 0900 Gross per 24 hour  Intake 1413.36 ml  Output 750 ml  Net 663.36 ml        Physical Exam: Vital Signs Blood pressure 111/64, pulse 91, temperature 97.8 F (36.6 C), resp. rate 16, height 5' 11 (1.803 m), weight 87.5 kg, SpO2 100%.  Constitutional: No distress . Vital signs reviewed. HEENT: NCAT, EOMI, oral membranes moist Neck: supple Cardiovascular: RRR without murmur. No JVD    Respiratory/Chest: CTA Bilaterally without wheezes or rales. Normal effort    GI/Abdomen: BS +, non-tender, non-distended. Ostomy sealed Ext: no clubbing, cyanosis, or edema Psych: quiet but cooperative  Skin: abdominal wound pink with granulation tissue present. Uro: ostomies in place with clear output yellow/clear  Neuro:  Alert and oriented x 3. Normal insight and awareness. Intact Memory. Normal language and speech. Cranial nerve exam unremarkable. MMT: BUE 4/5 prox to distal. BLE 2/5 HF, 3-KE and 4/5 ADF/PF. Sensory exam normal for light touch and pain in all 4 limbs. No limb ataxia or cerebellar signs. No abnormal tone appreciated.  .  Prior neuro assessment is c/w 07/16/2024 exam.  Musculoskeletal: both knees remain tight in about 20 degrees flexion contractures    Assessment/Plan: 1. Functional deficits which require 3+ hours per day of interdisciplinary therapy in a comprehensive inpatient rehab setting. Physiatrist is providing close team supervision and 24 hour management of active medical problems listed below. Physiatrist and rehab team continue to assess barriers to discharge/monitor patient progress toward functional and medical  goals  Care Tool:  Bathing  Body parts bathed by patient: Chest, Face, Abdomen, Right arm, Left arm   Body parts bathed by helper: Left upper leg, Right lower leg, Right upper leg, Buttocks, Front perineal area, Left lower leg     Bathing assist Assist Level: Moderate Assistance - Patient 50 - 74% (Mod/MaxA)     Upper Body Dressing/Undressing Upper body dressing   What is the patient wearing?: Pull over shirt    Upper body assist Assist Level: Maximal Assistance - Patient 25 - 49%    Lower Body Dressing/Undressing Lower body dressing      What is the patient wearing?: Pants, Incontinence brief     Lower body assist Assist for lower body dressing: Maximal Assistance - Patient 25 - 49% (MaxA /Dep)     Toileting Toileting    Toileting assist Assist for toileting:  (patient has colosteomy and urostomy)     Transfers Chair/bed transfer  Transfers assist  Chair/bed transfer activity did not occur: Safety/medical concerns  Chair/bed transfer assist level: 2 Helpers     Locomotion Ambulation   Ambulation assist   Ambulation activity did not occur: Safety/medical concerns (fatigue/dizziness/orthostatics)          Walk 10 feet activity   Assist  Walk 10 feet activity did not occur: Safety/medical concerns (fatigue/dizziness/orthostatics)        Walk 50 feet activity   Assist Walk 50 feet with 2 turns activity did not occur: Safety/medical concerns (fatigue/dizziness/orthostatics)         Walk 150 feet activity   Assist Walk 150 feet activity did not occur: Safety/medical concerns (fatigue/dizziness/orthostatics)         Walk 10 feet on uneven surface  activity   Assist Walk 10 feet on uneven surfaces activity did not occur: Safety/medical concerns (fatigue/dizziness/orthostatics)         Wheelchair     Assist Is the patient using a wheelchair?: Yes Type of Wheelchair: Manual           Wheelchair 50 feet with 2 turns  activity    Assist    Wheelchair 50 feet with 2 turns activity did not occur: Safety/medical concerns       Wheelchair 150 feet activity     Assist  Wheelchair 150 feet activity did not occur: Safety/medical concerns       Blood pressure 111/64, pulse 91, temperature 97.8 F (36.6 C), resp. rate 16, height 5' 11 (1.803 m), weight 87.5 kg, SpO2 100%. Medical Problem List and Plan: 1. 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  H/o PE/Antithrombotics: -DVT/anticoagulation:  Pharmaceutical: Eliquis              -antiplatelet therapy: N/A 3. Pain Management: Oxycodone  prn.  4. Mood/Behavior/Sleep: LCSW to follow for evaluation and support.              --melatonin for insomnia.              -antipsychotic agents: N/A 5. Neuropsych/cognition: This patient is not capable of making decisions on his own behalf. 6. Skin/Wound Care: Routine pressure relief measures. Encourage adequate diet             --dressing changes daily. Gerhardt's cream to buttock wound.  7. Fluids/Electrolytes/Nutrition/oropharyngeal dysphagia:  Periactin  for  appetite not doing much for him so far             --continue juven bid. Has been refusing Ensure supplement.   -kdur 20meq daily for potassium of 3.4  -can't use zofran  d/t QTc prolongation (490)  - MBS   9/3--D3/thins  -9/4 --may be a little dryer on labs today   -increase periactin  to 4mg  TID.   -push po 8. Sepsis/Abdominal wall/mesh infection: IV Vanc/Ceftriaxone  for 6 weeks from 08/21             --Probable 6 months total of antibiotics--ID follow up 09/18 @10  am --Corynebacterium species-->6 more weeks Dalbavancin? V/s tedezolid/linezolid . Ecoli-->ceftriaxone  to Omadocycline pending susceptibility.  -ESR 50, CRP 0.9 9. Left> right hydronephrosis: Nephrostomy tubes with  stent exchanged on 08/27 --continue clamping right tube and left to drain per urology.  10. Acute on chronic renal failure: Improved but slowly trending up with SCr 1.48 --continue to monitor 11. OSA: Encourage CPAP use 12. Hypotension/tachycardia: Continue Midodrine  15 mg TID and Florinef  2 mg daily             --continues to have significant tachycardia with activity likely due to orthostasis and deconditioning.  -9/4 -some improvement   -volume/renal   at current baseline   -hgb 11.5   -added metoprolol  low dose 12.5mg  bid, bp remains soft but ok at present   -encourage PO intake 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    9/1 held reglan  14. Depression: On Zoloft  15. Malnutrition: Albumin  @1 .9/TP-5.1.   16. Anemia of chronic illness: Stable overall.          LOS: 5 days A FACE TO FACE EVALUATION WAS PERFORMED  Kristopher Thompson 07/16/2024, 10:23 AM

## 2024-07-17 DIAGNOSIS — E440 Moderate protein-calorie malnutrition: Secondary | ICD-10-CM | POA: Insufficient documentation

## 2024-07-17 LAB — BASIC METABOLIC PANEL WITH GFR
Anion gap: 7 (ref 5–15)
BUN: 42 mg/dL — ABNORMAL HIGH (ref 8–23)
CO2: 19 mmol/L — ABNORMAL LOW (ref 22–32)
Calcium: 9.9 mg/dL (ref 8.9–10.3)
Chloride: 110 mmol/L (ref 98–111)
Creatinine, Ser: 1.93 mg/dL — ABNORMAL HIGH (ref 0.61–1.24)
GFR, Estimated: 36 mL/min — ABNORMAL LOW (ref >60)
Glucose, Bld: 84 mg/dL (ref 70–99)
Potassium: 3.5 mmol/L (ref 3.5–5.1)
Sodium: 136 mmol/L (ref 135–145)

## 2024-07-17 LAB — VANCOMYCIN, RANDOM: Vancomycin Rm: 23 ug/mL

## 2024-07-17 MED ORDER — ORAL CARE MOUTH RINSE
15.0000 mL | OROMUCOSAL | Status: DC | PRN
Start: 2024-07-17 — End: 2024-07-17

## 2024-07-17 MED ORDER — SODIUM CHLORIDE 0.45 % IV SOLN: INTRAVENOUS | Status: AC

## 2024-07-17 MED ORDER — VANCOMYCIN VARIABLE DOSE PER UNSTABLE RENAL FUNCTION (PHARMACIST DOSING): Status: DC

## 2024-07-17 NOTE — Progress Notes (Signed)
 Pharmacy Antibiotic Note  Kristopher Thompson is a 73 y.o. male admitted on 07/11/2024 with intra- abdominal wall infection.  Pharmacy has been consulted for vancomycin  dosing (he is also on Rocephin ).  Renal function has worsened over the past few days following a period of stability with Scr ~1.4-1.5s. Scr now up to ~1.9 over the past few days.   VR 9/4 15:49= 28, VR 9/5 12:49= 23. Calculated 2-level Ke 0.0094, estimated half-life of ~73 hours, estimated dosing interval 90 hours. Patient demonstrating minimal vancomycin  clearance with last 1,000 mg dose administered 9/3 2100. Given previously elevated levels, would recommended resuming at a decreased dose when random level approaches 15. Will delay a recheck of the vancomcyin random level until AM labs on Sunday, given slow rate of vancomycin  clearance.   -ID also following: continuing antibiotics until 10/2  Plan: - STOP scheduled vancomycin  doses  - Vancomycin  variable dosing per unstable renal function - Vancomycin , random ordered for 9/7 0500  - MIC  for omadacycline (8/21) remains pending    Height: 5' 11 (180.3 cm) Weight: 87.5 kg (192 lb 14.4 oz) IBW/kg (Calculated) : 75.3  Temp (24hrs), Avg:98 F (36.7 C), Min:97.9 F (36.6 C), Max:98 F (36.7 C)  Recent Labs  Lab 07/12/24 0622 07/13/24 0546 07/15/24 1002 07/16/24 0513 07/16/24 1549 07/17/24 0511 07/17/24 1243  WBC 11.9* 10.3  --  8.8  --   --   --   CREATININE 1.57* 1.52*  --  1.85*  --  1.93*  --   VANCOTROUGH  --   --  20  --   --   --   --   VANCORANDOM  --  22  --   --  28  --  23    Estimated Creatinine Clearance: 36.8 mL/min (A) (by C-G formula based on SCr of 1.93 mg/dL (H)).    Allergies  Allergen Reactions   Demerol  [Meperidine ] Nausea And Vomiting    Antimicrobials this admission: Vancomycin  8/21 >  Cefepime  8/20>8/25 Ceftrix per ID 8/25>  Dose adjustments this admission: 8/22 VR 18 at 0835 - ~40 hrs after LD but AKI > improving > maint dose  begun 1gm q24h 8/26: SCr improved to 1.37>>1gm > 1250 mg IV q24h for eAUC 406 8/27 VP  52 - vancomycin  held 8/28 VT 39 -  8/30: VR 23 - 1250 mg IV q48h 9/1: VR 22 - decr 1000 mg IV q24h 9/4 VR =28  9/5 VR= 23 - going to dose per unstable renal function   Microbiology results: 8/20 blood: neg 8/20 urine: >100K/ml MRSA (MIC to Vanc 1; also sens TCN and linezolid  but R to cipro  and septra ) + >100K/ml Enterococcus faecium (MIC to Vanc < 0.5) 8/21 JP drainage (RLQ): abundant E coli (send Cefepime , Erta, CRO, Mero, Zosyn ) and Corynebacterium  Thank you for involving pharmacy in this patient's care.  Massie Fila, PharmD Clinical Pharmacist  07/17/2024 2:14 PM

## 2024-07-17 NOTE — Progress Notes (Signed)
 Physical Therapy Session Note  Patient Details  Name: Kristopher Thompson MRN: [id number] Date of Birth: Jan 25, 1951  Today's Date: 07/17/2024 PT Individual Time: 0805-0859 PT Individual Time Calculation (min): 54 min   Today's Date: 07/17/2024 PT Individual Time: 8896-8854 PT Individual Time Calculation (min): 42 min   Short Term Goals: Week 1:  PT Short Term Goal 1 (Week 1): pt will perform bed mobility with min A with LRAD PT Short Term Goal 2 (Week 1): pt will perform sit to stand with mod A or less PT Short Term Goal 3 (Week 1): pt will initiate gait training  Skilled Therapeutic Interventions/Progress Updates:     Pt received semi reclined in bed and agrees to therapy. No complaint of pain. Pt's wife present and assists to empty pt's drains prior to mobility. Supine to sit requires maxA with cues verbal and tactile facilitation of hand placement, logrolling, and sequencing. PT raises bed and pt performs seated scooting toward EOB. Stand pivot transfer from elevated bed to [facility] with maxA to facilitate anterior weight shift and with pt noted to have significant posterior bias, never shifting weight fully over base of support. WC transport to gym. Pt initially reforms quad sets with legs elevated to promote knee extension and hamstring lengthening. Pt also perform ankle pumps and partial ROM LAQs. Pt performs multiple reps of sit to stand with cues for hand placement and body mechanics. Pt completes with maxA +2 and use of RW. Pt able to achieve improved upright posture with center of gravity over base of support, with PT providing cues to shift weigh toward toes and press in RW. Pt completes x3 stands for total of 1:00 each bout, with pt demonstrating improved weight shifting and independence with static stance each trial. Following, pt left seated with all needs within reach.   2nd Session: Pt received seated in [facility] and agrees to therapy. WC transport to gym for time management. No complaint of  pain. Pt ambulates 2x10' with extended seated rest break. Pt requires maxA +2 for sit to stand with cues for anterior weigh tshifting, hand placement, and initiation. Once standing with RW, pt ambulates with modA +2 and +3 for WC follow. PT provides cues for hip and trunk extension as well as upright gaze to improve posture and balance. PT also cues to increase step height and stride length to decrease shuffling gait pattern. Pt takes extended seated rest break. Pt then performs posterior and anterior WC propulsion to strengthen knee extensors and knee flexors, x20' in each direction with cues for body mechanics and correct performance. WC transport back to room. Left seated with all needs within reach.   Therapy Documentation Precautions:  Precautions Precautions: Fall (HOH, Urostomy, Colostomy, PCN  drain, and orthostatic hypotension.) Recall of Precautions/Restrictions: Impaired Precaution/Restrictions Comments: urostomy, colostomy, lt PCN drain, watch BP and HR Restrictions Weight Bearing Restrictions Per Provider Order: No   Therapy/Group: Individual Therapy  Elsie JAYSON Dawn, PT, DPT 07/17/2024, 4:38 PM

## 2024-07-17 NOTE — Plan of Care (Signed)
  Problem: Consults Goal: RH GENERAL PATIENT EDUCATION Description: See Patient Education module for education specifics. Outcome: Progressing   Problem: RH BOWEL ELIMINATION Goal: RH STG MANAGE BOWEL WITH ASSISTANCE Description: STG Manage Bowel with supervision Assistance. Outcome: Progressing   Problem: RH BLADDER ELIMINATION Goal: RH STG MANAGE BLADDER WITH ASSISTANCE Description: STG Manage Bladder With supervision Assistance Outcome: Progressing   Problem: RH SKIN INTEGRITY Goal: RH STG SKIN FREE OF INFECTION/BREAKDOWN Description: Manage skin free of infection with supervision assistance Outcome: Progressing   Problem: RH SAFETY Goal: RH STG ADHERE TO SAFETY PRECAUTIONS W/ASSISTANCE/DEVICE Description: STG Adhere to Safety Precautions With Assistance/Device. Outcome: Progressing   Problem: RH PAIN MANAGEMENT Goal: RH STG PAIN MANAGED AT OR BELOW PT'S PAIN GOAL Description: <4 w/ prns Outcome: Progressing   Problem: RH KNOWLEDGE DEFICIT GENERAL Goal: RH STG INCREASE KNOWLEDGE OF SELF CARE AFTER HOSPITALIZATION Description: Manage increase knowledge of self care care after hospitalization with supervision assistance from wife using educational materials provided Outcome: Progressing

## 2024-07-17 NOTE — Progress Notes (Addendum)
 PROGRESS NOTE   Subjective/Complaints: Still struggling with appetite although did eat a bit more yesterday. BP/HR ok this morning.   ROS: Patient denies fever, rash, sore throat, blurred vision, dizziness, nausea, vomiting, diarrhea, cough, shortness of breath or chest pain, joint or back/neck pain, headache, or mood change.    Objective:   No results found.  Recent Labs    07/16/24 0513  WBC 8.8  HGB 11.5*  HCT 34.7*  PLT 303   Recent Labs    07/16/24 0513 07/17/24 0511  NA 137 136  K 3.6 3.5  CL 107 110  CO2 17* 19*  GLUCOSE 82 84  BUN 40* 42*  CREATININE 1.85* 1.93*  CALCIUM  9.9 9.9    Intake/Output Summary (Last 24 hours) at 07/17/2024 1117 Last data filed at 07/17/2024 0600 Gross per 24 hour  Intake 812.03 ml  Output 1150 ml  Net -337.97 ml        Physical Exam: Vital Signs Blood pressure 92/63, pulse (!) 57, temperature 98 F (36.7 C), resp. rate 20, height 5' 11 (1.803 m), weight 87.5 kg, SpO2 100%.  Constitutional: No distress . Vital signs reviewed. HEENT: NCAT, EOMI, oral membranes moist Neck: supple Cardiovascular: RRR without murmur. No JVD    Respiratory/Chest: CTA Bilaterally without wheezes or rales. Normal effort    GI/Abdomen: BS +, non-tender, non-distended, ostomy sealed Ext: no clubbing, cyanosis, or edema Psych: pleasant and cooperative  Skin: abdominal wound pink with granulation tissue present. Uro: ostomies in place with clear output remains yellow/clear  Neuro:  Alert and oriented x 3. Normal insight and awareness. Intact Memory. Normal language and speech. Cranial nerve exam unremarkable. MMT: BUE 4/5 prox to distal. BLE 2+/5 HF, 3KE and 4/5 ADF/PF. Sensory exam normal for light touch and pain in all 4 limbs. No limb ataxia or cerebellar signs. No abnormal tone appreciated.  Musculoskeletal: both knees remain tight in about 20 degrees flexion contractures     Assessment/Plan: 1. Functional deficits which require 3+ hours per day of interdisciplinary therapy in a comprehensive inpatient rehab setting. Physiatrist is providing close team supervision and 24 hour management of active medical problems listed below. Physiatrist and rehab team continue to assess barriers to discharge/monitor patient progress toward functional and medical goals  Care Tool:  Bathing    Body parts bathed by patient: Chest, Face, Abdomen, Right arm, Left arm   Body parts bathed by helper: Left upper leg, Right lower leg, Right upper leg, Buttocks, Front perineal area, Left lower leg     Bathing assist Assist Level: Moderate Assistance - Patient 50 - 74% (Mod/MaxA)     Upper Body Dressing/Undressing Upper body dressing   What is the patient wearing?: Pull over shirt    Upper body assist Assist Level: Maximal Assistance - Patient 25 - 49%    Lower Body Dressing/Undressing Lower body dressing      What is the patient wearing?: Pants, Incontinence brief     Lower body assist Assist for lower body dressing: Maximal Assistance - Patient 25 - 49% (MaxA /Dep)     Toileting Toileting    Toileting assist Assist for toileting:  (patient has colosteomy and urostomy)  Transfers Chair/bed transfer  Transfers assist  Chair/bed transfer activity did not occur: Safety/medical concerns  Chair/bed transfer assist level: Maximal Assistance - Patient 25 - 49%     Locomotion Ambulation   Ambulation assist   Ambulation activity did not occur: Safety/medical concerns (fatigue/dizziness/orthostatics)          Walk 10 feet activity   Assist  Walk 10 feet activity did not occur: Safety/medical concerns (fatigue/dizziness/orthostatics)        Walk 50 feet activity   Assist Walk 50 feet with 2 turns activity did not occur: Safety/medical concerns (fatigue/dizziness/orthostatics)         Walk 150 feet activity   Assist Walk 150 feet  activity did not occur: Safety/medical concerns (fatigue/dizziness/orthostatics)         Walk 10 feet on uneven surface  activity   Assist Walk 10 feet on uneven surfaces activity did not occur: Safety/medical concerns (fatigue/dizziness/orthostatics)         Wheelchair     Assist Is the patient using a wheelchair?: Yes Type of Wheelchair: Manual           Wheelchair 50 feet with 2 turns activity    Assist    Wheelchair 50 feet with 2 turns activity did not occur: Safety/medical concerns       Wheelchair 150 feet activity     Assist  Wheelchair 150 feet activity did not occur: Safety/medical concerns       Blood pressure 92/63, pulse (!) 57, temperature 98 F (36.7 C), resp. rate 20, height 5' 11 (1.803 m), weight 87.5 kg, SpO2 100%. Medical Problem List and Plan: 1. 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  H/o PE/Antithrombotics: -DVT/anticoagulation:  Pharmaceutical: Eliquis              -antiplatelet therapy: N/A 3. Pain Management: Oxycodone  prn.  4. Mood/Behavior/Sleep: LCSW to follow for evaluation and support.              --melatonin for insomnia.              -antipsychotic agents: N/A 5. Neuropsych/cognition: This patient is not capable of making decisions on his own behalf. 6. Skin/Wound Care: Routine pressure relief measures. Encourage adequate diet             --dressing changes daily. Gerhardt's cream to buttock wound.  7. Fluids/Electrolytes/Nutrition/oropharyngeal dysphagia:  Periactin  for appetite not doing much for him so far             --continue juven bid. Has been refusing Ensure supplement.   -kdur 20meq daily for potassium of 3.4  -can't use zofran  d/t QTc prolongation (490)  - MBS   9/3--D3/thins  -9/4 - -increased periactin  to 4mg  TID.  -9/5   Cr trending up the wrong direction   -push fluids, eating a litle better?   -add IVF at HS   -BMET in am 8. Sepsis/Abdominal wall/mesh infection: IV Vanc/Ceftriaxone  for 6 weeks from 08/21             --Probable 6 months total of antibiotics--ID follow up 09/18 @10  am --Corynebacterium species-->6 more weeks Dalbavancin? V/s tedezolid/linezolid . Ecoli-->ceftriaxone  to Omadocycline pending susceptibility.  -ESR 50, CRP 0.9 9. Left> right hydronephrosis: Nephrostomy tubes  with stent exchanged on 08/27 --continue clamping right tube and left to drain per urology.  10. Acute on chronic renal failure: Improved but slowly trending up with SCr 1.48 --continue to monitor 11. OSA: Encourage CPAP use 12. Hypotension/tachycardia: Continue Midodrine  15 mg TID and Florinef  2 mg daily             --continues to have significant tachycardia with activity likely due to orthostasis and deconditioning.  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    9/1 held reglan  14. Depression: On Zoloft  15. Malnutrition: Albumin  @1 .9/TP-5.1.   16. Anemia of chronic illness: Stable overall.          LOS: 6 days A FACE TO FACE EVALUATION WAS PERFORMED  Arthea ONEIDA Gunther 07/17/2024, 11:17 AM

## 2024-07-17 NOTE — Progress Notes (Signed)
 Occupational Therapy Session Note  Patient Details  Name: Kristopher Thompson MRN: [id number] Date of Birth: 1951/04/17  Today's Date: 07/17/2024 OT Individual Time: 8594-8554 OT Individual Time Calculation (min): 40 min    Short Term Goals: Week 1:  OT Short Term Goal 1 (Week 1): The pt will come from supine in bed to EOB with MinA  using AE as needed. OT Short Term Goal 2 (Week 1): The pt will complete UB/LB bathing at [facility] incorporating AE as needed. OT Short Term Goal 3 (Week 1): The pt will dress UB/LB with ModA incorporating AE as needed. OT Short Term Goal 4 (Week 1): The pt will improve activity tolerance to good 3 of 5 occurences. OT Short Term Goal 5 (Week 1): The pt will safely improve core strength to 3+/5 MMT.  Skilled Therapeutic Interventions/Progress Updates:    Pt received sitting up in the w/c with no c/o pain, agreeable to OT session. He denied any ADL need. Pt was taken via w/c to the therapy gym for time management. He completed HIIT style cardiorespiratory/vascular endurance style training on the [place] to address functional activity tolerance and BLE strength needed for independence with ADLs/transfers. He completed 40 sec on/off x5  at 20 cm/sec resistance with mod cueing required for increasing pace and effort in each step. He then stood with mod A +2 from the w/c in prep for functional activity when his urostomy bag began leaking. Notified RN who inspected it. It was determined he needed to return to bed d/t the leaking for full bag change. He was taken back to his room via w/c. He stood with mod +2 assist and completed 10 ft of functional mobility back to his bed. He required mod cueing for RW management during turn, upright trunk/posture, and pacing. He required max A to return to bed for management of the BLE and trunk. He was left supine in the care of the RN. 20 min missed.   Therapy Documentation Precautions:  Precautions Precautions: Fall (HOH, Urostomy,  Colostomy, PCN  drain, and orthostatic hypotension.) Recall of Precautions/Restrictions: Impaired Precaution/Restrictions Comments: urostomy, colostomy, lt PCN drain, watch BP and HR Restrictions Weight Bearing Restrictions Per Provider Order: No General: General OT Amount of Missed Time: 20 Minutes    Therapy/Group: Individual Therapy  [name] 07/17/2024, 2:50 PM

## 2024-07-17 NOTE — Progress Notes (Signed)
 Patient ID: TAMARA KENYON, male   DOB: 11/01/1951, 73 y.o.   MRN: [id number]  SW received updates from Pam[facility] reporting that Iv abx will be about $145 per day and they will be paying for cost of supplies since [facility] does not assist with this. SW discussed with ID if any changes to meds could be made and informed with his growth- this is the best med regimen.   SW discussed above with pt wife. She would ike assistance and understands she may need to consider switching to another HHA. Will discuss further once d/c date is set.   Graeme Jude, MSW, LCSW Office: [phone] Cell: [phone] Fax: [phone]

## 2024-07-17 NOTE — Progress Notes (Signed)
 Occupational Therapy Session Note  Patient Details  Name: Kristopher Thompson MRN: [id number] Date of Birth: October 16, 1951  Today's Date: 07/17/2024 OT Individual Time: 0915-1000 OT Individual Time Calculation (min): 45 min    Short Term Goals: Week 1:  OT Short Term Goal 1 (Week 1): The pt will come from supine in bed to EOB with MinA  using AE as needed. OT Short Term Goal 2 (Week 1): The pt will complete UB/LB bathing at [facility] incorporating AE as needed. OT Short Term Goal 3 (Week 1): The pt will dress UB/LB with ModA incorporating AE as needed. OT Short Term Goal 4 (Week 1): The pt will improve activity tolerance to good 3 of 5 occurences. OT Short Term Goal 5 (Week 1): The pt will safely improve core strength to 3+/5 MMT.  Skilled Therapeutic Interventions/Progress Updates:    1:1 Pt received in the w/c.  Engaged in self care retraining at sink level . Tried to engage pt sitting EOC to perform grooming and UB bathing to work on core strength and endurance. Pt with difficulty with problem solving changing LB clothing today - discussed goals and encouraged sit to stands. Pt performed sit to stand with STEDY with mod A +2 to come into standing. Total A for buttocks cleansing. Pt was only able to stand for less than 30 seconds. Sitting with max A for controlled decent. Pt focused on threading underwear and pants with max A with cues for organizing. Pt stood again with STEDY with mod A +2 for Ot to pull up pants. Pt with flat affect today. Tried to encourage and praise progress. Pt left sitting up in w/c with nursing to try to take his meds.   Therapy Documentation Precautions:  Precautions Precautions: Fall (HOH, Urostomy, Colostomy, PCN  drain, and orthostatic hypotension.) Recall of Precautions/Restrictions: Impaired Precaution/Restrictions Comments: urostomy, colostomy, lt PCN drain, watch BP and HR Restrictions Weight Bearing Restrictions Per Provider Order: No  Pain:  No reports of  pain  Therapy/Group: Individual Therapy  Claudene Nest [facility] 07/17/2024, 2:40 PM

## 2024-07-18 LAB — BASIC METABOLIC PANEL WITH GFR
Anion gap: 9 (ref 5–15)
BUN: 39 mg/dL — ABNORMAL HIGH (ref 8–23)
CO2: 19 mmol/L — ABNORMAL LOW (ref 22–32)
Calcium: 10.1 mg/dL (ref 8.9–10.3)
Chloride: 107 mmol/L (ref 98–111)
Creatinine, Ser: 2.12 mg/dL — ABNORMAL HIGH (ref 0.61–1.24)
GFR, Estimated: 32 mL/min — ABNORMAL LOW (ref >60)
Glucose, Bld: 89 mg/dL (ref 70–99)
Potassium: 3.3 mmol/L — ABNORMAL LOW (ref 3.5–5.1)
Sodium: 135 mmol/L (ref 135–145)

## 2024-07-18 MED ORDER — POTASSIUM CHLORIDE CRYS ER 20 MEQ PO TBCR
20.0000 meq | EXTENDED_RELEASE_TABLET | Freq: Every day | ORAL | Status: DC
  Administered 2024-07-18 – 2024-07-27 (×10): 20 meq via ORAL
  Filled 2024-07-18 (×10): qty 1

## 2024-07-18 MED ORDER — POTASSIUM CHLORIDE IN NACL 20-0.45 MEQ/L-% IV SOLN
INTRAVENOUS | Status: AC
  Filled 2024-07-18 (×3): qty 1000

## 2024-07-18 MED ORDER — LINEZOLID 600 MG PO TABS
600.0000 mg | ORAL_TABLET | Freq: Two times a day (BID) | ORAL | Status: DC
  Administered 2024-07-18 – 2024-07-20 (×5): 600 mg via ORAL
  Filled 2024-07-18 (×8): qty 1

## 2024-07-18 NOTE — Progress Notes (Addendum)
 ID brief note Called re increasing cr and asked if alternative for vanco. Currenlty on vanco and ctx for abd wall/meshi infection  Woudn cx with MDRO E colia dn corynebacterium   I called lab to see if susceptibility testing for Corynebacterium available yet.   Rec  At this point while awaiting susceptibility  testing will hold vanco and start linezolid . Daptomycin may be an option if non C striatum and if susceptible Continue ceftriaxone  Work up for other causes of worsening cr.

## 2024-07-18 NOTE — Progress Notes (Addendum)
 PROGRESS NOTE   Subjective/Complaints: Drank a strawberry milkshake last night. Trying to eat more. Slept well.   ROS: Patient denies fever, rash, sore throat, blurred vision, dizziness, nausea, vomiting, diarrhea, cough, shortness of breath or chest pain, joint or back/neck pain, headache, or mood change.    Objective:   No results found.  Recent Labs    07/16/24 0513  WBC 8.8  HGB 11.5*  HCT 34.7*  PLT 303   Recent Labs    07/17/24 0511 07/18/24 0457  NA 136 135  K 3.5 3.3*  CL 110 107  CO2 19* 19*  GLUCOSE 84 89  BUN 42* 39*  CREATININE 1.93* 2.12*  CALCIUM  9.9 10.1    Intake/Output Summary (Last 24 hours) at 07/18/2024 0801 Last data filed at 07/18/2024 0600 Gross per 24 hour  Intake 605.8 ml  Output 1250 ml  Net -644.2 ml        Physical Exam: Vital Signs Blood pressure 112/68, pulse 67, temperature 98.1 F (36.7 C), temperature source Oral, resp. rate 16, height 5' 11 (1.803 m), weight 87.5 kg, SpO2 99%.  Constitutional: No distress . Vital signs reviewed. HEENT: NCAT, EOMI, oral membranes moist Neck: supple Cardiovascular: RRR without murmur. No JVD    Respiratory/Chest: CTA Bilaterally without wheezes or rales. Normal effort    GI/Abdomen: BS +, non-tender, non-distended Ext: no clubbing, cyanosis, or edema Psych: pleasant and cooperative  Skin: abdominal wound is closing and with pink with granulation tissue. Uro: ostomies in place with clear output remains yellow/clear  Neuro:  Alert and oriented x 3. Normal insight and awareness. Intact Memory. Normal language and speech. Cranial nerve exam unremarkable. MMT: BUE 4/5 prox to distal. BLE 2+/5 HF, 3KE and 4/5 ADF/PF. Sensory exam normal for light touch and pain in all 4 limbs. No limb ataxia or cerebellar signs. No abnormal tone appreciated.  Musculoskeletal: both knees remain tight in about 20 degrees flexion contractures     Assessment/Plan: 1. Functional deficits which require 3+ hours per day of interdisciplinary therapy in a comprehensive inpatient rehab setting. Physiatrist is providing close team supervision and 24 hour management of active medical problems listed below. Physiatrist and rehab team continue to assess barriers to discharge/monitor patient progress toward functional and medical goals  Care Tool:  Bathing    Body parts bathed by patient: Chest, Face, Abdomen, Right arm, Left arm   Body parts bathed by helper: Left upper leg, Right lower leg, Right upper leg, Buttocks, Front perineal area, Left lower leg     Bathing assist Assist Level: Moderate Assistance - Patient 50 - 74% (Mod/MaxA)     Upper Body Dressing/Undressing Upper body dressing   What is the patient wearing?: Pull over shirt    Upper body assist Assist Level: Maximal Assistance - Patient 25 - 49%    Lower Body Dressing/Undressing Lower body dressing      What is the patient wearing?: Pants, Incontinence brief     Lower body assist Assist for lower body dressing: Maximal Assistance - Patient 25 - 49% (MaxA /Dep)     Toileting Toileting    Toileting assist Assist for toileting:  (patient has colosteomy and urostomy)  Transfers Chair/bed transfer  Transfers assist  Chair/bed transfer activity did not occur: Safety/medical concerns  Chair/bed transfer assist level: Maximal Assistance - Patient 25 - 49%     Locomotion Ambulation   Ambulation assist   Ambulation activity did not occur: Safety/medical concerns (fatigue/dizziness/orthostatics)          Walk 10 feet activity   Assist  Walk 10 feet activity did not occur: Safety/medical concerns (fatigue/dizziness/orthostatics)        Walk 50 feet activity   Assist Walk 50 feet with 2 turns activity did not occur: Safety/medical concerns (fatigue/dizziness/orthostatics)         Walk 150 feet activity   Assist Walk 150 feet  activity did not occur: Safety/medical concerns (fatigue/dizziness/orthostatics)         Walk 10 feet on uneven surface  activity   Assist Walk 10 feet on uneven surfaces activity did not occur: Safety/medical concerns (fatigue/dizziness/orthostatics)         Wheelchair     Assist Is the patient using a wheelchair?: Yes Type of Wheelchair: Manual           Wheelchair 50 feet with 2 turns activity    Assist    Wheelchair 50 feet with 2 turns activity did not occur: Safety/medical concerns       Wheelchair 150 feet activity     Assist  Wheelchair 150 feet activity did not occur: Safety/medical concerns       Blood pressure 112/68, pulse 67, temperature 98.1 F (36.7 C), temperature source Oral, resp. rate 16, height 5' 11 (1.803 m), weight 87.5 kg, SpO2 99%. Medical Problem List and Plan: 1. 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  H/o PE/Antithrombotics: -DVT/anticoagulation:  Pharmaceutical: Eliquis              -antiplatelet therapy: N/A 3. Pain Management: Oxycodone  prn.  4. Mood/Behavior/Sleep: LCSW to follow for evaluation and support.              --melatonin for insomnia.              -antipsychotic agents: N/A 5. Neuropsych/cognition: This patient is not capable of making decisions on his own behalf. 6. Skin/Wound Care: Routine pressure relief measures. Encourage adequate diet             --dressing changes daily. Gerhardt's cream to buttock wound.   -abdominal wound continues to close 7. Fluids/Electrolytes/Nutrition/oropharyngeal dysphagia:  Periactin  for appetite not doing much for him so far             --continue juven bid. Has been refusing Ensure supplement.   -kdur 20meq daily for potassium of 3.4  -can't use zofran  d/t QTc prolongation (490)  - MBS    9/3--D3/thins  -9/4 - -increased periactin  to 4mg  TID.  -9/6   -BUN better with IVF---continue fluids at HS    -encouraging pt to eat anything he wants 8. Sepsis/Abdominal wall/mesh infection: IV Vanc/Ceftriaxone  for 6 weeks from 08/21             --Probable 6 months total of antibiotics--ID follow up 09/18 @10  am --Corynebacterium species-->6 more weeks Dalbavancin? V/s tedezolid/linezolid . Ecoli-->ceftriaxone  to Omadocycline pending susceptibility.  -ESR 50, CRP 0.9 9. Left> right hydronephrosis: Nephrostomy  tubes with stent exchanged on 08/27 --continue clamping right tube and left to drain per urology.  10. Acute on chronic renal failure: Improved but slowly trending up with SCr 1.48  -9/6  Cr trending in the wrong direction   -BUN better with IVF---continue fluids at HS   -could be related to vanc/cftrx---will check with pharmacy/ID   -continue serial labs 11. OSA: Encourage CPAP use 12. Hypotension/tachycardia: Continue Midodrine  15 mg TID and Florinef  2 mg daily             --continues to have significant tachycardia with activity likely due to orthostasis and deconditioning.  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    9/1 held reglan  14. Depression: On Zoloft  15. Malnutrition: Albumin  @1 .9/TP-5.1.   16. Anemia of chronic illness: Stable overall.          LOS: 7 days A FACE TO FACE EVALUATION WAS PERFORMED  Arthea ONEIDA Gunther 07/18/2024, 8:01 AM

## 2024-07-18 NOTE — Plan of Care (Signed)
  Problem: Consults Goal: RH GENERAL PATIENT EDUCATION Description: See Patient Education module for education specifics. Outcome: Progressing   Problem: RH BOWEL ELIMINATION Goal: RH STG MANAGE BOWEL WITH ASSISTANCE Description: STG Manage Bowel with supervision Assistance. Outcome: Progressing   Problem: RH BLADDER ELIMINATION Goal: RH STG MANAGE BLADDER WITH ASSISTANCE Description: STG Manage Bladder With supervision Assistance Outcome: Progressing   Problem: RH SKIN INTEGRITY Goal: RH STG SKIN FREE OF INFECTION/BREAKDOWN Description: Manage skin free of infection with supervision assistance Outcome: Progressing

## 2024-07-19 LAB — VANCOMYCIN, RANDOM: Vancomycin Rm: 17 ug/mL

## 2024-07-19 LAB — BASIC METABOLIC PANEL WITH GFR
Anion gap: 12 (ref 5–15)
BUN: 41 mg/dL — ABNORMAL HIGH (ref 8–23)
CO2: 17 mmol/L — ABNORMAL LOW (ref 22–32)
Calcium: 9.8 mg/dL (ref 8.9–10.3)
Chloride: 107 mmol/L (ref 98–111)
Creatinine, Ser: 1.87 mg/dL — ABNORMAL HIGH (ref 0.61–1.24)
GFR, Estimated: 38 mL/min — ABNORMAL LOW (ref >60)
Glucose, Bld: 91 mg/dL (ref 70–99)
Potassium: 3.6 mmol/L (ref 3.5–5.1)
Sodium: 136 mmol/L (ref 135–145)

## 2024-07-19 NOTE — Progress Notes (Addendum)
 PROGRESS NOTE   Subjective/Complaints: Tells me he ate more yesterday and he did somewhat. No other issues other than bed malfunctioning this morning.   ROS: Patient denies fever, rash, sore throat, blurred vision, dizziness, nausea, vomiting, diarrhea, cough, shortness of breath or chest pain,  headache, or mood change.    Objective:   No results found.  No results for input(s): WBC, HGB, HCT, PLT in the last 72 hours.  Recent Labs    07/18/24 0457 07/19/24 0505  NA 135 136  K 3.3* 3.6  CL 107 107  CO2 19* 17*  GLUCOSE 89 91  BUN 39* 41*  CREATININE 2.12* 1.87*  CALCIUM  10.1 9.8    Intake/Output Summary (Last 24 hours) at 07/19/2024 0752 Last data filed at 07/19/2024 0503 Gross per 24 hour  Intake 895.09 ml  Output 1000 ml  Net -104.91 ml        Physical Exam: Vital Signs Blood pressure (!) 103/59, pulse 61, temperature 98.1 F (36.7 C), temperature source Oral, resp. rate 16, height 5' 11 (1.803 m), weight 87.5 kg, SpO2 99%.  Constitutional: No distress . Vital signs reviewed. HEENT: NCAT, EOMI, oral membranes moist Neck: supple Cardiovascular: RRR without murmur. No JVD    Respiratory/Chest: CTA Bilaterally without wheezes or rales. Normal effort    GI/Abdomen: BS +, non-tender, non-distended, ostomy bag full of gas Ext: no clubbing, cyanosis, or edema Psych: flat but pleasant and cooperative  Skin: abdominal wound continues to close with pink with granulation tissue. Uro: ostomies in place with clear output remains yellow/clear  Neuro:  Alert and oriented x 3. Normal insight and awareness. Intact Memory. Normal language and speech. Cranial nerve exam unremarkable. MMT: BUE 4/5 prox to distal. BLE 2+/5 HF, 3KE and 4/5 ADF/PF. Sensory exam normal for light touch and pain in all 4 limbs. No limb ataxia or cerebellar signs. No abnormal tone appreciated.  Musculoskeletal: both knees remain tight in  about 20 degrees flexion contractures---tight at ankles too to a certain extent    Assessment/Plan: 1. Functional deficits which require 3+ hours per day of interdisciplinary therapy in a comprehensive inpatient rehab setting. Physiatrist is providing close team supervision and 24 hour management of active medical problems listed below. Physiatrist and rehab team continue to assess barriers to discharge/monitor patient progress toward functional and medical goals  Care Tool:  Bathing    Body parts bathed by patient: Chest, Face, Abdomen, Right arm, Left arm   Body parts bathed by helper: Left upper leg, Right lower leg, Right upper leg, Buttocks, Front perineal area, Left lower leg     Bathing assist Assist Level: Moderate Assistance - Patient 50 - 74% (Mod/MaxA)     Upper Body Dressing/Undressing Upper body dressing   What is the patient wearing?: Pull over shirt    Upper body assist Assist Level: Maximal Assistance - Patient 25 - 49%    Lower Body Dressing/Undressing Lower body dressing      What is the patient wearing?: Pants, Incontinence brief     Lower body assist Assist for lower body dressing: Maximal Assistance - Patient 25 - 49% (MaxA /Dep)     Toileting Toileting  Toileting assist Assist for toileting:  (patient has colosteomy and urostomy)     Transfers Chair/bed transfer  Transfers assist  Chair/bed transfer activity did not occur: Safety/medical concerns  Chair/bed transfer assist level: Maximal Assistance - Patient 25 - 49%     Locomotion Ambulation   Ambulation assist   Ambulation activity did not occur: Safety/medical concerns (fatigue/dizziness/orthostatics)          Walk 10 feet activity   Assist  Walk 10 feet activity did not occur: Safety/medical concerns (fatigue/dizziness/orthostatics)        Walk 50 feet activity   Assist Walk 50 feet with 2 turns activity did not occur: Safety/medical concerns  (fatigue/dizziness/orthostatics)         Walk 150 feet activity   Assist Walk 150 feet activity did not occur: Safety/medical concerns (fatigue/dizziness/orthostatics)         Walk 10 feet on uneven surface  activity   Assist Walk 10 feet on uneven surfaces activity did not occur: Safety/medical concerns (fatigue/dizziness/orthostatics)         Wheelchair     Assist Is the patient using a wheelchair?: Yes Type of Wheelchair: Manual           Wheelchair 50 feet with 2 turns activity    Assist    Wheelchair 50 feet with 2 turns activity did not occur: Safety/medical concerns       Wheelchair 150 feet activity     Assist  Wheelchair 150 feet activity did not occur: Safety/medical concerns       Blood pressure (!) 103/59, pulse 61, temperature 98.1 F (36.7 C), temperature source Oral, resp. rate 16, height 5' 11 (1.803 m), weight 87.5 kg, SpO2 99%. Medical Problem List and Plan: 1. 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  H/o PE/Antithrombotics: -DVT/anticoagulation:  Pharmaceutical: Eliquis              -antiplatelet therapy: N/A 3. Pain Management: Oxycodone  prn.  4. Mood/Behavior/Sleep: LCSW to follow for evaluation and support.              --melatonin for insomnia.              -antipsychotic agents: N/A 5. Neuropsych/cognition: This patient is not capable of making decisions on his own behalf. 6. Skin/Wound Care: Routine pressure relief measures. Encourage adequate diet             --dressing changes daily. Gerhardt's cream to buttock wound.   -abdominal wound continues to close 7. Fluids/Electrolytes/Nutrition/oropharyngeal dysphagia:  Periactin  for appetite not doing much for him so far             --continue juven bid. Has been refusing Ensure supplement.    -kdur 20meq daily for potassium of 3.4  -can't use zofran  d/t QTc prolongation (490)  - MBS   9/3--D3/thins  -9/4 - -increased periactin  to 4mg  TID.  -9/7 Cr sl better today, continue to push fluids. Continue IVF as well   -pushing po. Pt knows he needs to eat more, food from outside ok   -prealbumin tomorrow 8. Sepsis/Abdominal wall/mesh infection: IV Vanc/Ceftriaxone  for 6 weeks from 08/21             --Probable 6 months total  of antibiotics--ID follow up 09/18 @10  am --Corynebacterium species-->6 more weeks Dalbavancin?vs tedezolid/linezolid . Ecoli-->ceftriaxone  to Omadocycline pending susceptibility.  -ESR 50, CRP 0.9 -spoke with ID 9/6 about options given Cr--still waiting on corynebacterium susceptibilities--continue vanc for now 9. Left> right hydronephrosis: Nephrostomy tubes with stent exchanged on 08/27 --continue clamping right tube and left to drain per urology.  10. Acute on chronic renal failure: Improved but slowly trending up with SCr 1.48  -9/7  elevated CR   -Cr finally demonstrated some improvement today   -could be related to vanc---ID discussion as above   -continue IVF and serial labs 11. OSA: Encourage CPAP use 12. Hypotension/tachycardia: Continue Midodrine  15 mg TID and Florinef  2 mg daily             --continues to have significant tachycardia with activity likely due to orthostasis and deconditioning.  -9/4 -some improvement   -volume/renal   at current baseline   -hgb 11.5   -continue metoprolol  low dose 12.5mg  bid, bp remains soft but stable at present   -  IVF as above 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    9/1 held reglan  14. Depression: On Zoloft  15. Malnutrition: Albumin  @1 .9/TP-5.1.   16. Anemia of chronic illness: Stable overall. --check cbc tomorrow         LOS: 8 days A FACE TO FACE EVALUATION WAS PERFORMED  Kristopher Thompson 07/19/2024, 7:52 AM

## 2024-07-19 NOTE — Progress Notes (Signed)
 Speech Language Pathology Daily Session Note  Patient Details  Name: IRVAN TIEDT MRN: [id number] Date of Birth: 10/01/1951  Today's Date: 07/19/2024 SLP Individual Time: 0130-0230 SLP Individual Time Calculation (min): 60 min  Short Term Goals: Week 1: SLP Short Term Goal 1 (Week 1): Patient will tolerate Dys 3 diet textures with reduced reguritation episodes using safe swallow strategies. SLP Short Term Goal 2 (Week 1): Patient will recall safe swallow strategies (upright for PO, alternate solids/liquids, slow rate, small bites/sips) with 80% acc when provided min A. SLP Short Term Goal 3 (Week 1): Patient will use compensatory memory strategies to recall functional information in the current environment when provided mod A. SLP Short Term Goal 4 (Week 1): Patient will improve problem solving and planning skills in functional tasks when provided mod A.  Skilled Therapeutic Interventions: Skilled intervention focused on cognition. Pt completed recall task with recalling detailed information from ads after a delay with min A cues. He completed problem solving task requiring abstract reasoning to describe objects and not include specific words with min A verbal cues. He stated he did not want to trial any food this session and that wife may bring him food later. Medications reviewed at end of session. He was able to recall 85% of medications purpose. Cont therapy per plan of care.      Pain Pain Assessment Pain Scale: Faces Faces Pain Scale: No hurt  Therapy/Group: Individual Therapy  Recardo LABOR Tamanika Heiney 07/19/2024, 2:24 PM

## 2024-07-19 NOTE — Progress Notes (Signed)
 Patient and wife asked this nurse to start leaving meds in the room so he could take on his own due to his nausea. This nurse educated both patient and wife that leaving meds are against nursing guidelines. This nurse stated that he can be given ginger ale and anti-nausea medication via IV and medications can be administered a few minutes later. Patient stated no and anti-nausea medication does not work for him. Patient had an emesis episode of clear liquid and he stated all his pills came up. This nurse informed him that no pills were noted on his shirt and he stated there were in the blue bag. The nurse helped the patient get comfortable by changing his shirt and helping him with a fresh gown. After leaving the room, this nurse informed the charge nurse and handed the NT a new emesis bag to give to the patient. The NT opened the door and asked this nurse to help move the patient up in the bed. While in the room, emesis bag noted in the trash and this nurse pulled the emesis bag out and looked in the bag and there was clear emesis and no pills. Charge nurse made aware.

## 2024-07-20 DIAGNOSIS — B9620 Unspecified Escherichia coli [E. coli] as the cause of diseases classified elsewhere: Secondary | ICD-10-CM

## 2024-07-20 DIAGNOSIS — L02211 Cutaneous abscess of abdominal wall: Secondary | ICD-10-CM

## 2024-07-20 DIAGNOSIS — Z1624 Resistance to multiple antibiotics: Secondary | ICD-10-CM

## 2024-07-20 DIAGNOSIS — N178 Other acute kidney failure: Secondary | ICD-10-CM

## 2024-07-20 DIAGNOSIS — T8149XD Infection following a procedure, other surgical site, subsequent encounter: Secondary | ICD-10-CM

## 2024-07-20 DIAGNOSIS — N1832 Chronic kidney disease, stage 3b: Secondary | ICD-10-CM

## 2024-07-20 DIAGNOSIS — B9689 Other specified bacterial agents as the cause of diseases classified elsewhere: Secondary | ICD-10-CM

## 2024-07-20 LAB — CBC
HCT: 36.5 % — ABNORMAL LOW (ref 39.0–52.0)
Hemoglobin: 11.9 g/dL — ABNORMAL LOW (ref 13.0–17.0)
MCH: 27.2 pg (ref 26.0–34.0)
MCHC: 32.6 g/dL (ref 30.0–36.0)
MCV: 83.5 fL (ref 80.0–100.0)
Platelets: 343 K/uL (ref 150–400)
RBC: 4.37 MIL/uL (ref 4.22–5.81)
RDW: 15 % (ref 11.5–15.5)
WBC: 8.2 K/uL (ref 4.0–10.5)
nRBC: 0 % (ref 0.0–0.2)

## 2024-07-20 LAB — BASIC METABOLIC PANEL WITH GFR
Anion gap: 7 (ref 5–15)
BUN: 42 mg/dL — ABNORMAL HIGH (ref 8–23)
CO2: 17 mmol/L — ABNORMAL LOW (ref 22–32)
Calcium: 9.9 mg/dL (ref 8.9–10.3)
Chloride: 111 mmol/L (ref 98–111)
Creatinine, Ser: 1.97 mg/dL — ABNORMAL HIGH (ref 0.61–1.24)
GFR, Estimated: 35 mL/min — ABNORMAL LOW (ref >60)
Glucose, Bld: 88 mg/dL (ref 70–99)
Potassium: 4.5 mmol/L (ref 3.5–5.1)
Sodium: 135 mmol/L (ref 135–145)

## 2024-07-20 LAB — SEDIMENTATION RATE: Sed Rate: 44 mm/h — ABNORMAL HIGH (ref 0–16)

## 2024-07-20 LAB — C-REACTIVE PROTEIN: CRP: 0.8 mg/dL (ref <1.0)

## 2024-07-20 LAB — PREALBUMIN: Prealbumin: 20 mg/dL (ref 18–38)

## 2024-07-20 NOTE — Progress Notes (Signed)
 Bed does not have feature to weigh patient.

## 2024-07-20 NOTE — Progress Notes (Signed)
 Speech Language Pathology Weekly Progress and Session Note  Patient Details  Name: Kristopher Thompson MRN: [id number] Date of Birth: 25-Jun-1951  Beginning of progress report period: July 13, 2024 End of progress report period: July 20, 2024  Today's Date: 07/20/2024 SLP Individual Time: 1003-1059 SLP Individual Time Calculation (min): 56 min  Short Term Goals: Week 1: SLP Short Term Goal 1 (Week 1): Patient will tolerate Dys 3 diet textures with reduced reguritation episodes using safe swallow strategies. SLP Short Term Goal 1 - Progress (Week 1): Not met SLP Short Term Goal 2 (Week 1): Patient will recall safe swallow strategies (upright for PO, alternate solids/liquids, slow rate, small bites/sips) with 80% acc when provided min A. SLP Short Term Goal 2 - Progress (Week 1): Not met SLP Short Term Goal 3 (Week 1): Patient will use compensatory memory strategies to recall functional information in the current environment when provided mod A. SLP Short Term Goal 3 - Progress (Week 1): Met SLP Short Term Goal 4 (Week 1): Patient will improve problem solving and planning skills in functional tasks when provided mod A. SLP Short Term Goal 4 - Progress (Week 1): Met SLP Short Term Goal 5 (Week 1): Patient will use compensatory memory strategies to recall functional information in the current environment when provided min A.    New Short Term Goals: Week 2: SLP Short Term Goal 1 (Week 2): Patient will improve problem solving and planning skills inmoderate complexity tasks when provided min A. SLP Short Term Goal 2 (Week 2): Patient will recall safe swallow strategies (upright for PO, alternate solids/liquids, slow rate, small bites/sips) with 80% acc when provided min A. SLP Short Term Goal 3 (Week 2): Patient will tolerate Dys 3 diet textures with reduced reguritation episodes using safe swallow strategies. SLP Short Term Goal 4 (Week 2): Patient will complete pharyngeal strengthening  exercises as appropriate given mod A in order to improve swallow function SLP Short Term Goal 5 (Week 2): Patient will use compensatory memory strategies to recall functional information in the current environment when provided min A.  Weekly Progress Updates: Pt has made steady gains and has met 2 of 4 STG's this reporting period due to improved recall and problem solving within current environement. Pt's diet assessed and managed this reporting period after completion of MBSS. MBSS completed with recommendations for diet of Dys 3 solids and thin liquids. Pt continues to be limited by frequent nausea and regurgitation along with poor appetite. Currently pt continues to require min to mod A for recal problem solving. Pt/ family education ongoing. Pt would benefit from continued skilled SLP intervention to maximize cognition and swallowing in order to maximize his functional independence prior to discharge.   Intensity: Minumum of 1-2 x/day, 30 to 90 minutes Frequency: 3 to 5 out of 7 days Duration/Length of Stay: TBD Treatment/Interventions: Cognitive remediation/compensation;Functional tasks;Internal/external aids;Patient/family education;Medication managment;Dysphagia/aspiration precaution training;Therapeutic Activities;Therapeutic Exercise   Daily Session  Skilled Therapeutic Interventions: Patient was seen in am to address cognitive re- training. Pt was alert and seated upright in WC upon SLP arrival. He was agreeable for session. Pt's wife also present and participating intermittently throughout. Pt reports significant nausea this date and therefore deferred trials of water  and solids. He was agreeable for participation in cognitive tasks. SLP introduced WRAP compensatory strategies and examples of utilization. He was subsequently challenged in recall of moderate level novel information presented verbally. SLP guided pt in repetition and association strategies. After a 20 minute distracted  delay,  pt recalled information with 100% acc indep. In other minutes of session, SLP continued medication training. Pt unable to recall any of current medication list. SLP first challenged pt in prescription interpretation wit pt completing task with mod I. He was subsequently challenged in identifying errors in a schematic of a BID pill box which he completed with overall sup A. At conclusion of session, pt was left upright in [facility] with call button within reach and wife present. SLP to continue POC.  General    Pain Pain Assessment Pain Scale: 0-10 Pain Score: 0-No pain  Therapy/Group: Individual Therapy  [name] 07/20/2024, 12:45 PM

## 2024-07-20 NOTE — Progress Notes (Signed)
 Occupational Therapy Weekly Progress Note  Patient Details  Name: Kristopher Thompson MRN: [id number] Date of Birth: 02-18-1951  Beginning of progress report period: 07/12/24 End of progress report period: 07/20/24  Today's Date: 07/20/2024 OT Individual Time: 9154-9054 OT Individual Time Calculation (min): 60 min    Patient has met 4 of 5 short term goals.  [name] is making progress toward his OT goals, slowly improving his activity tolerance, cardiovascular/respiratory endurance, sitting and standing balance and overall strength that all contributes to performance with ADLs. He is completing UB ADLs at mod A level and LB at max A. Stand pivot transfers are at max A overall. His wife is present and supportive.   Patient continues to demonstrate the following deficits: muscle weakness, decreased cardiorespiratoy endurance, decreased problem solving, decreased safety awareness, decreased memory, and delayed processing, and decreased sitting balance, decreased standing balance, and decreased balance strategies and therefore will continue to benefit from skilled OT intervention to enhance overall performance with BADL and iADL.  Patient progressing toward long term goals..  Plan of care revisions: Goals set too high at mod I, downgraded to (S)- CGA overall.  OT Short Term Goals Week 1:  OT Short Term Goal 1 (Week 1): The pt will come from supine in bed to EOB with MinA  using AE as needed. OT Short Term Goal 1 - Progress (Week 1): Progressing toward goal OT Short Term Goal 2 (Week 1): The pt will complete UB/LB bathing at [facility] incorporating AE as needed. OT Short Term Goal 2 - Progress (Week 1): Met OT Short Term Goal 3 (Week 1): The pt will dress UB/LB with ModA incorporating AE as needed. OT Short Term Goal 3 - Progress (Week 1): Met OT Short Term Goal 4 (Week 1): The pt will improve activity tolerance to good 3 of 5 occurences. OT Short Term Goal 4 - Progress (Week 1): Met OT Short Term Goal 5  (Week 1): The pt will safely improve core strength to 3+/5 MMT. OT Short Term Goal 5 - Progress (Week 1): Met Week 2:  OT Short Term Goal 1 (Week 2): Pt will stand from w/c with mod A OT Short Term Goal 2 (Week 2): Pt will complete LB dressing with mod A OT Short Term Goal 3 (Week 2): Pt will complete ADL routine with no more than 2 rest breaks OT Short Term Goal 4 (Week 2): Pt will complete stand pivot transfer to w/c with mod A using LRAD  Skilled Therapeutic Interventions/Progress Updates:    Pt received supine, taking medications very slowly. His wife reports all the liquid he has to take with medications (he takes one at a time) is contributing to nausea. He was having some difficulty picking up very small pills from the table top- OT provided assist with [facility] to pick up each pill. Extra time required. Skilled monitoring of vitals throughout session to ensure hemodynamic and cardiorespiratory stability- requiring extra time to ensure pt safety and stability. BP supine 105/69. He reported L heel pain worse than R. He did have redness on the heel. Provided pressure relief education, applied a heel foam pad. Wife states he will not wear PRAFOs. He came to EOB with extra time and mod A for trunk elevation. He completed sit > stand x2 from EOB with heavy mod A, +2 present for safety. He required max A to complete 3-4 steps with the RW to the w/c. He completed grooming tasks at the sink with set up assist. He changed  gowns quickly with mod A. BP following 122/74. HR remained around 100 bpm. Pt was left sitting up in the wheelchair with all needs met and call bell within reach.   Therapy Documentation Precautions:  Precautions Precautions: Fall (HOH, Urostomy, Colostomy, PCN  drain, and orthostatic hypotension.) Recall of Precautions/Restrictions: Impaired Precaution/Restrictions Comments: urostomy, colostomy, lt PCN drain, watch BP and HR Restrictions Weight Bearing Restrictions Per Provider Order:  No  Therapy/Group: Individual Therapy  [name] 07/20/2024, 8:55 AM

## 2024-07-20 NOTE — Progress Notes (Signed)
 Physical Therapy Session Note  Patient Details  Name: Kristopher Thompson MRN: [id number] Date of Birth: Jul 07, 1951  Today's Date: 07/20/2024 PT Missed Time: 75 Minutes Missed Time Reason: Patient ill (Comment) (Pt reported several episodes of vomiting)  Pt reported several episodes of vomiting this AM, stated that he'd prefer to rest today. Nsg present in room at time of therapy, reported giving pt his nausea medicine. Pt made aware that he did not have PT this weekend and that he'd have to participate tomorrow to keep from losing progress made thus far in therapy and pt nodded his head. Pt wife in agreement as well. Will attempt to make up time as possible.   Therapy Documentation Precautions:  Precautions Precautions: Fall (HOH, Urostomy, Colostomy, PCN  drain, and orthostatic hypotension.) Recall of Precautions/Restrictions: Impaired Precaution/Restrictions Comments: urostomy, colostomy, lt PCN drain, watch BP and HR Restrictions Weight Bearing Restrictions Per Provider Order: No General: PT Amount of Missed Time (min): 75 Minutes PT Missed Treatment Reason: Patient ill (Comment) (Pt reported several episodes of vomiting)   Therapy/Group: Individual Therapy  [name] 07/20/2024, 3:09 PM

## 2024-07-20 NOTE — Progress Notes (Signed)
 Nutrition Follow Up  DOCUMENTATION CODES:  Non-severe (moderate) malnutrition in context of chronic illness (bladder cancer, tachycardia, hx bowel perforation)  INTERVENTION:  Recommended small, frequent meals while dealing with n/v, encouraged bites of food frequently throughout the day and ordering bland foods  Diet recommendations per SLP assessment Currently on dysphagia 3 w/ thins  Ensure Plus High Protein po BID or Fairlife shakes from home  Juven BID to support wound healing  MVI with minerals daily  Monitor ostomy output  NUTRITION DIAGNOSIS:  Moderate Malnutrition related to chronic illness (bladder cancer, tachycardia,  hx bowel perforation) as evidenced by moderate fat depletion, moderate muscle depletion, percent weight loss. Remains applicable  GOAL:  Patient will meet greater than or equal to 90% of their needs Progressing  MONITOR:  PO intake, Supplement acceptance, Weight trends  REASON FOR ASSESSMENT:  Consult Assessment of nutrition requirement/status  ASSESSMENT:  Pt admitted to CIR with debility following most recent admission for fatigue, mental status changes, tachycardia, hypotension and purulent drainage from abdominal wall drain. PMH significant for HFpEF, CAD, T2DM, PE on eliquis , prostate cancer, bladder cancer s/p cystoprostatectomy and ileal conduit (2013), recent admission 04/10/24 for parastomal-incisional abdominal wall hernia repair w/graft and ileal conduit reversal c/b intra-abdominal and abdominal wall abscess from bowel perforation s/p end ileostomy.  Recent Admissions: 5/30-7/11: bowel perforation s/p SBR/end ileostomy 7/14-7/16: AKI, infection of ileostomy site 7/16-8/8: Admitted to CIR 8/20-8/30: Admitted to [facility]: AKI, severe sepsis  Current Admission: 8/30 admitted to CIR 9/3 MBS, recommended DYS 3 w/ thins  Pt sitting in bedside chair at time of assessment. Pt reports continued nausea and emesis. Wife reports 2 emesis events in  last 24 hr. Pt struggling with regurgitation during assessment and stated talking about food even makes me sick. Continued work with SLP but barriers include n/v and regurgitation issues. Intake has decreased since last assessment, diet summary documentation shows average intake is 31% (previously 43%). Pt has been on periactin  TID for appetite but it has not helped. Pt has been drinking fluids but even those seem to make him sick along with taking medications. Discussed concerns with MD.    Average Meal Completion: 8/30-9/2: 43% average intake x 6 recorded meals 9/1-9/5: 31% average intake x 8 recorded meals  Medications:  Vitamin B12 500 mcg Periactin  TID Imodium  TID Midodrine  TID MVI w/ minerals Juven BID Lovaza  3x/weekly Potassium chloride  20 mEq Psyllium  Zoloft   Ceftriaxone / Vancomycin    Labs:  BUN 42 Cr 1.97 GFR 35  Ileostomy: 1500 mL x 36 hr Urostomy:  300 mL   Diet Order:   Diet Order             DIET DYS 3 Room service appropriate? Yes; Fluid consistency: Thin  Diet effective now                   EDUCATION NEEDS:  Education needs have been addressed  Skin:  Skin Assessment: Reviewed RN Assessment (open abdominal surgical incision)  Last BM:  ileostomy output x 36 hr  Height:  Ht Readings from Last 1 Encounters:  07/12/24 5' 11 (1.803 m)    Weight:  Wt Readings from Last 1 Encounters:  07/11/24 87.5 kg   BMI:  Body mass index is 26.9 kg/m.  Estimated Nutritional Needs:   Kcal:  2200-2400  Protein:  120-135g  Fluid:  >/=2L  Josette Glance, MS, RDN, LDN Clinical Dietitian I Please reach out via secure chat

## 2024-07-20 NOTE — Progress Notes (Signed)
 PROGRESS NOTE   Subjective/Complaints:  No dizziness  Labs reviewed and d/w pt and wife   ROS: Patient denies fever, rash, sore throat, blurred vision, dizziness, nausea, vomiting, diarrhea, cough, shortness of breath or chest pain,  headache, or mood change.    Objective:   No results found.  Recent Labs    07/20/24 0506  WBC 8.2  HGB 11.9*  HCT 36.5*  PLT 343    Recent Labs    07/19/24 0505 07/20/24 0506  NA 136 135  K 3.6 4.5  CL 107 111  CO2 17* 17*  GLUCOSE 91 88  BUN 41* 42*  CREATININE 1.87* 1.97*  CALCIUM  9.8 9.9    Intake/Output Summary (Last 24 hours) at 07/20/2024 0937 Last data filed at 07/20/2024 9167 Gross per 24 hour  Intake --  Output 1800 ml  Net -1800 ml        Physical Exam: Vital Signs Blood pressure 102/63, pulse (!) 55, temperature 97.6 F (36.4 C), temperature source Oral, resp. rate 16, height 5' 11 (1.803 m), weight 87.5 kg, SpO2 98%.   General: No acute distress Mood and affect are appropriate Heart: Regular rate and rhythm no rubs murmurs or extra sounds Lungs: Clear to auscultation, breathing unlabored, no rales or wheezes Abdomen: Positive bowel sounds, soft nontender to palpation, nondistended Extremities: No clubbing, cyanosis, or edema Skin: No evidence of breakdown, no evidence of rash   Skin: abdominal wound continues to close with pink with granulation tissue. Uro: ostomies in place with clear output remains yellow/clear  Neuro:  Alert and oriented x 3. Normal insight and awareness. Intact Memory. Normal language and speech. Cranial nerve exam unremarkable. MMT: BUE 4/5 prox to distal. BLE 3-/5 HF, 3-KE and 4/5 ADF/PF.  No limb ataxia or cerebellar signs. No abnormal tone appreciated.  Musculoskeletal: both knees remain tight in about 20 degrees flexion contractures--   Assessment/Plan: 1. Functional deficits which require 3+ hours per day of interdisciplinary  therapy in a comprehensive inpatient rehab setting. Physiatrist is providing close team supervision and 24 hour management of active medical problems listed below. Physiatrist and rehab team continue to assess barriers to discharge/monitor patient progress toward functional and medical goals  Care Tool:  Bathing    Body parts bathed by patient: Chest, Face, Abdomen, Right arm, Left arm   Body parts bathed by helper: Left upper leg, Right lower leg, Right upper leg, Buttocks, Front perineal area, Left lower leg     Bathing assist Assist Level: Moderate Assistance - Patient 50 - 74% (Mod/MaxA)     Upper Body Dressing/Undressing Upper body dressing   What is the patient wearing?: Pull over shirt    Upper body assist Assist Level: Maximal Assistance - Patient 25 - 49%    Lower Body Dressing/Undressing Lower body dressing      What is the patient wearing?: Pants, Incontinence brief     Lower body assist Assist for lower body dressing: Maximal Assistance - Patient 25 - 49% (MaxA /Dep)     Toileting Toileting    Toileting assist Assist for toileting:  (patient has colosteomy and urostomy)     Transfers Chair/bed transfer  Transfers assist  Chair/bed transfer activity did not occur: Safety/medical concerns  Chair/bed transfer assist level: Maximal Assistance - Patient 25 - 49%     Locomotion Ambulation   Ambulation assist   Ambulation activity did not occur: Safety/medical concerns (fatigue/dizziness/orthostatics)          Walk 10 feet activity   Assist  Walk 10 feet activity did not occur: Safety/medical concerns (fatigue/dizziness/orthostatics)        Walk 50 feet activity   Assist Walk 50 feet with 2 turns activity did not occur: Safety/medical concerns (fatigue/dizziness/orthostatics)         Walk 150 feet activity   Assist Walk 150 feet activity did not occur: Safety/medical concerns (fatigue/dizziness/orthostatics)         Walk 10  feet on uneven surface  activity   Assist Walk 10 feet on uneven surfaces activity did not occur: Safety/medical concerns (fatigue/dizziness/orthostatics)         Wheelchair     Assist Is the patient using a wheelchair?: Yes Type of Wheelchair: Manual           Wheelchair 50 feet with 2 turns activity    Assist    Wheelchair 50 feet with 2 turns activity did not occur: Safety/medical concerns       Wheelchair 150 feet activity     Assist  Wheelchair 150 feet activity did not occur: Safety/medical concerns       Blood pressure 102/63, pulse (!) 55, temperature 97.6 F (36.4 C), temperature source Oral, resp. rate 16, height 5' 11 (1.803 m), weight 87.5 kg, SpO2 98%. Medical Problem List and Plan: 1. 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  H/o PE/Antithrombotics: -DVT/anticoagulation:  Pharmaceutical: Eliquis              -antiplatelet therapy: N/A 3. Pain Management: Oxycodone  prn.  4. Mood/Behavior/Sleep: LCSW to follow for evaluation and support.              --melatonin for insomnia.              -antipsychotic agents: N/A 5. Neuropsych/cognition: This patient is not capable of making decisions on his own behalf. 6. Skin/Wound Care: Routine pressure relief measures. Encourage adequate diet             --dressing changes daily. Gerhardt's cream to buttock wound.   -abdominal wound continues to close 7. Fluids/Electrolytes/Nutrition/oropharyngeal dysphagia:  Periactin  for appetite not doing much for him so far             --continue juven bid. Has been refusing Ensure supplement.   -kdur 20meq daily for potassium of 3.4  -can't use zofran  d/t QTc prolongation (490)  - MBS   9/3--D3/thins      Latest Ref Rng & Units 07/20/2024    5:06 AM 07/19/2024    5:05 AM 07/18/2024     4:57 AM  BMP  Glucose 70 - 99 mg/dL 88  91  89   BUN 8 - 23 mg/dL 42  41  39   Creatinine 0.61 - 1.24 mg/dL 8.02  8.12  7.87   Sodium 135 - 145 mmol/L 135  136  135   Potassium 3.5 - 5.1 mmol/L 4.5  3.6  3.3  Chloride 98 - 111 mmol/L 111  107  107   CO2 22 - 32 mmol/L 17  17  19    Calcium  8.9 - 10.3 mg/dL 9.9  9.8  89.8       -prealbumin suprizingly normal at 20 on 9/8 8. Sepsis/Abdominal wall/mesh infection: IV Vanc/Ceftriaxone  for 6 weeks from 08/21             --Probable 6 months total of antibiotics--ID follow up 09/18 @10  am --Corynebacterium species-->6 more weeks Dalbavancin?vs tedezolid/linezolid . Ecoli-->ceftriaxone  to Omadocycline pending susceptibility.  -ESR 50, CRP 0.9 -spoke with ID 9/6 about options given Cr--still waiting on corynebacterium susceptibilities--continue vanc for now CRP nl 0.8 ESR 44 WBC 8.2 9. Left> right hydronephrosis: Nephrostomy tubes with stent exchanged on 08/27 --continue clamping right tube and left to drain per urology.  10. Acute on chronic renal failure: Improved but slowly trending up with SCr 1.48  -9/7  elevated CR   -Cr finally demonstrated some improvement today   -could be related to vanc---ID discussion as above   -continue IVF and serial labs See above stable  11. OSA: Encourage CPAP use 12. Hypotension/tachycardia: Continue Midodrine  15 mg TID and Florinef  2 mg daily             --continues to have significant tachycardia with activity likely due to orthostasis and deconditioning.  -9/4 -some improvement   -volume/renal   at current baseline   -hgb 11.5   -continue metoprolol  low dose 12.5mg  bid, bp remains soft but stable at present   -  IVF as above No changes 9/8 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    9/1 held reglan  14. Depression: On Zoloft  15. Malnutrition: Albumin  @1 .9/TP-5.1.   16. Anemia of chronic illness: Stable overall 9/8       Latest Ref Rng & Units 07/20/2024    5:06 AM 07/16/2024    5:13 AM  07/13/2024    5:46 AM  CBC  WBC 4.0 - 10.5 K/uL 8.2  8.8  10.3   Hemoglobin 13.0 - 17.0 g/dL 88.0  88.4  88.4   Hematocrit 39.0 - 52.0 % 36.5  34.7  35.7   Platelets 150 - 400 K/uL 343  303  245          LOS: 9 days A FACE TO FACE EVALUATION WAS PERFORMED  Kristopher Thompson 07/20/2024, 9:37 AM

## 2024-07-20 NOTE — Progress Notes (Signed)
 [facility]  Date of Admission:  07/11/2024    Principal Problem:   Critical illness myopathy Active Problems:   Cognitive and behavioral changes   Malnutrition of moderate degree          Assessment: 73 year old male with abdominal wall/mesh infection: #Abdominal wall abscess from prior perforation status post multiple procedures #JP drain for abdominal wall cultures growing MDRO ecoli and corynebacterium species #AKI Procedures: 5/30 parastomal and incisional incarcerated abd wall hernia --> LOA; parastomal hernia repair with mesh; urostomy ileal conduit revision 6/09 perforated bowel --> drainage of right abd wall abscess and intraabd abscess; explantation of mesh; small bowel resection in continuity; placement of wound vac 6/10 delayed bowel perforation with open abd --> LOA; abd wall I&D; ileal resection; end ileostomy; abd wall parastomal/incisional hernia repair with phasix mesh (dissolvable in 1 year); fascia closure and wound vac placement 6/23 bilateral nephrostomy tube placement 6/30 bilateral nephrostomy tube exchange 7/02 right ureteral stent placement       Current Admission Cx: 8/20 blood cx negative   8/21 Jp drain from the abd wall cx - Ecoli (R amp, cefazolin , cipro , bactrim ; I amp/sulb; S ceftriaxone , piptazo), Corynebacterium species   8/21 urine cx mrsa (S tetra; R bactrim ), e faecium (V sensitive)  -Plan was to do 6 weeks of vancomycin  and ceftriaxone  with transition to dalbavancin versus linezolid /tedizolid and ceftriaxone  and minocycline pending sensitivities. - Patient developed worsening renal function vancomycin  transition to linezolid .  Recommendations: -Continue linezolid  and ceftriaxone  - Follow renal function.  Consider outpatient Dalvance for an extended period of time given worsening renal function to cover for corynebacterium - Will follow-up on omadacyline sensitivities for E. coli.  Sensitivities for  corynebacterium from cultures on 8/21 have not been sent. - I discussed with patient given that mesh is in place will need suppression antibiotics. -Contact precautions   Microbiology:   Antibiotics: Linezolid  9/6-present Ceftriaxone  Cultures: Blood  Urine  Other   SUBJECTIVE: Resting in bed. Interval: Afebrile overnight.  WBC 8.2 K.  Review of Systems: Review of Systems  All other systems reviewed and are negative.    Scheduled Meds:  apixaban   5 mg Oral BID   Chlorhexidine  Gluconate Cloth  6 each Topical Daily   cyanocobalamin   500 mcg Oral Q M,W,F   cyproheptadine   4 mg Oral TID   feeding supplement  237 mL Oral BID BM   fludrocortisone   0.2 mg Oral Daily   Gerhardt's butt cream  1 Application Topical BID   leptospermum manuka honey  1 Application Topical Daily   linezolid   600 mg Oral Q12H   loperamide   4 mg Oral TID   melatonin  3 mg Oral QHS   metoprolol  tartrate  12.5 mg Oral BID   midodrine   15 mg Oral TID WC   multivitamin with minerals  1 tablet Oral Daily   nutrition supplement (JUVEN)  1 packet Oral BID BM   omega-3 acid ethyl esters  1 g Oral QODAY   potassium chloride   20 mEq Oral Daily   psyllium  1 packet Oral Daily   rosuvastatin   10 mg Oral QPM   sertraline   100 mg Oral Daily   Continuous Infusions:  cefTRIAXone  (ROCEPHIN )  IV Stopped (07/20/24 0036)   PRN Meds:.acetaminophen , alum & mag hydroxide-simeth, bisacodyl , cyclobenzaprine , diphenhydrAMINE , guaiFENesin -dextromethorphan, hydrOXYzine , iohexol , ipratropium, melatonin, metoCLOPramide  (REGLAN ) injection, mouth rinse, oxyCODONE -acetaminophen , prochlorperazine  **OR** prochlorperazine  **OR** prochlorperazine , sodium chloride , sodium phosphate  Allergies  Allergen Reactions  Demerol  [Meperidine ] Nausea And Vomiting    OBJECTIVE: Vitals:   07/19/24 1531 07/19/24 2011 07/19/24 2120 07/20/24 0458  BP: 111/66 104/71 104/71 102/63  Pulse: (!) 57 60 60 (!) 55  Resp: 16 16  16   Temp: 98.5 F  (36.9 C) 98.4 F (36.9 C)  97.6 F (36.4 C)  TempSrc: Oral Oral  Oral  SpO2: 100% 97%  98%  Weight:      Height:       Body mass index is 26.9 kg/m.  Physical Exam Constitutional:      General: He is not in acute distress.    Appearance: He is normal weight. He is not toxic-appearing.  HENT:     Head: Normocephalic and atraumatic.     Right Ear: External ear normal.     Left Ear: External ear normal.     Nose: No congestion or rhinorrhea.     Mouth/Throat:     Mouth: Mucous membranes are moist.     Pharynx: Oropharynx is clear.  Eyes:     Extraocular Movements: Extraocular movements intact.     Conjunctiva/sclera: Conjunctivae normal.     Pupils: Pupils are equal, round, and reactive to light.  Cardiovascular:     Heart sounds: No murmur heard.    No friction rub. No gallop.  Pulmonary:     Effort: Pulmonary effort is normal.     Breath sounds: Normal breath sounds.  Musculoskeletal:        General: No swelling.     Cervical back: Normal range of motion and neck supple.  Skin:    General: Skin is warm and dry.  Neurological:     General: No focal deficit present.     Mental Status: He is oriented to person, place, and time.  Psychiatric:        Mood and Affect: Mood normal.       Lab Results Lab Results  Component Value Date   WBC 8.2 07/20/2024   HGB 11.9 (L) 07/20/2024   HCT 36.5 (L) 07/20/2024   MCV 83.5 07/20/2024   PLT 343 07/20/2024    Lab Results  Component Value Date   CREATININE 1.97 (H) 07/20/2024   BUN 42 (H) 07/20/2024   NA 135 07/20/2024   K 4.5 07/20/2024   CL 111 07/20/2024   CO2 17 (L) 07/20/2024    Lab Results  Component Value Date   ALT 20 07/12/2024   AST 20 07/12/2024   ALKPHOS 78 07/12/2024   BILITOT 0.5 07/12/2024        Loney Stank, MD [facility] 07/20/2024, 1:50 PM Evaluation of this patient requires complex antimicrobial therapy evaluation and counseling +  isolation needs for disease transmission risk assessment and mitigation

## 2024-07-20 NOTE — Progress Notes (Signed)
 Met with patient and wife to review current situation, team conference and plan of care. Reviewed medications, discussed ST recommendations on taking medications, and swallowing strategies. Reviewed nephrostomy tubes care, ileostomy per wife she knows how to care, wound care.. Encouraged fluid intake and supplements. Continue to follow along  to provide educational needs to facilitate preparation for discharge.

## 2024-07-20 NOTE — Plan of Care (Signed)
 Goals adjusted to reflect more realistic OT POC.   Problem: RH Balance Goal: LTG Patient will maintain dynamic standing with ADLs (OT) Description: LTG:  Patient will maintain dynamic standing balance with assist during activities of daily living (OT)  Flowsheets (Taken 07/20/2024 0858) LTG: Pt will maintain dynamic standing balance during ADLs with: (downgraded to realistic goal- SD 9/8) Contact Guard/Touching assist   Problem: Sit to Stand Goal: LTG:  Patient will perform sit to stand in prep for activites of daily living with assistance level (OT) Description: LTG:  Patient will perform sit to stand in prep for activites of daily living with assistance level (OT) Flowsheets (Taken 07/20/2024 0858) LTG: PT will perform sit to stand in prep for activites of daily living with assistance level: (downgraded to realistic goal- SD 9/8) Contact Guard/Touching assist   Problem: RH Grooming Goal: LTG Patient will perform grooming w/assist,cues/equip (OT) Description: LTG: Patient will perform grooming with assist, with/without cues using equipment (OT) Flowsheets (Taken 07/20/2024 0858) LTG: Pt will perform grooming with assistance level of: (downgraded to realistic goal- SD 9/8) Supervision/Verbal cueing   Problem: RH Bathing Goal: LTG Patient will bathe all body parts with assist levels (OT) Description: LTG: Patient will bathe all body parts with assist levels (OT) Flowsheets (Taken 07/20/2024 0858) LTG: Pt will perform bathing with assistance level/cueing: (downgraded to realistic goal- SD 9/8) Contact Guard/Touching assist   Problem: RH Bathing Goal: LTG Patient will bathe all body parts with assist levels (OT) Description: LTG: Patient will bathe all body parts with assist levels (OT) Flowsheets (Taken 07/20/2024 0858) LTG: Pt will perform bathing with assistance level/cueing: (downgraded to realistic goal- SD 9/8) Contact Guard/Touching assist   Problem: RH Dressing Goal: LTG Patient will  perform upper body dressing (OT) Description: LTG Patient will perform upper body dressing with assist, with/without cues (OT). Flowsheets (Taken 07/20/2024 0858) LTG: Pt will perform upper body dressing with assistance level of: (downgraded to realistic goal- SD 9/8) Supervision/Verbal cueing Goal: LTG Patient will perform lower body dressing w/assist (OT) Description: LTG: Patient will perform lower body dressing with assist, with/without cues in positioning using equipment (OT) Flowsheets (Taken 07/20/2024 0858) LTG: Pt will perform lower body dressing with assistance level of: (downgraded to realistic goal- SD 9/8) Minimal Assistance - Patient > 75%   Problem: RH Functional Use of Upper Extremity Goal: LTG Patient will use RT/LT upper extremity as a (OT) Description: LTG: Patient will use right/left upper extremity as a stabilizer/gross assist/diminished/nondominant/dominant level with assist, with/without cues during functional activity (OT) Outcome: Not Applicable   Problem: RH Simple Meal Prep Goal: LTG Patient will perform simple meal prep w/assist (OT) Description: LTG: Patient will perform simple meal prep with assistance, with/without cues (OT). Outcome: Not Applicable   Problem: RH Tub/Shower Transfers Goal: LTG Patient will perform tub/shower transfers w/assist (OT) Description: LTG: Patient will perform tub/shower transfers with assist, with/without cues using equipment (OT) Outcome: Not Applicable

## 2024-07-21 DIAGNOSIS — I951 Orthostatic hypotension: Secondary | ICD-10-CM

## 2024-07-21 DIAGNOSIS — N178 Other acute kidney failure: Secondary | ICD-10-CM

## 2024-07-21 DIAGNOSIS — N1832 Chronic kidney disease, stage 3b: Secondary | ICD-10-CM

## 2024-07-21 MED ORDER — LINEZOLID 600 MG/300ML IV SOLN
600.0000 mg | Freq: Two times a day (BID) | INTRAVENOUS | Status: DC
  Administered 2024-07-21 – 2024-07-31 (×20): 600 mg via INTRAVENOUS
  Filled 2024-07-21 (×21): qty 300

## 2024-07-21 MED ORDER — CALCIUM POLYCARBOPHIL 625 MG PO TABS
625.0000 mg | ORAL_TABLET | Freq: Two times a day (BID) | ORAL | Status: DC
  Administered 2024-07-24 – 2024-08-14 (×42): 625 mg via ORAL
  Filled 2024-07-21 (×48): qty 1

## 2024-07-21 MED ORDER — BOOST / RESOURCE BREEZE PO LIQD CUSTOM
1.0000 | Freq: Three times a day (TID) | ORAL | Status: DC
  Administered 2024-07-21 – 2024-07-28 (×12): 1 via ORAL

## 2024-07-21 MED ORDER — LINEZOLID 600 MG PO TABS
600.0000 mg | ORAL_TABLET | Freq: Two times a day (BID) | ORAL | Status: DC
  Filled 2024-07-21: qty 1

## 2024-07-21 MED ORDER — CHILDRENS CHEW MULTIVITAMIN PO CHEW
1.0000 | CHEWABLE_TABLET | Freq: Two times a day (BID) | ORAL | Status: DC
  Administered 2024-07-21 – 2024-08-14 (×47): 1 via ORAL
  Filled 2024-07-21 (×51): qty 1

## 2024-07-21 MED ORDER — MIDODRINE HCL 5 MG PO TABS
15.0000 mg | ORAL_TABLET | Freq: Three times a day (TID) | ORAL | Status: DC
Start: 2024-07-21 — End: 2024-07-25
  Administered 2024-07-22 – 2024-07-25 (×10): 15 mg via ORAL
  Filled 2024-07-21 (×10): qty 3

## 2024-07-21 NOTE — Progress Notes (Signed)
 Brief Interventional Radiology Note:  73 year old male with a history of prostate cancer s/p prostatectomy and bladder cancer s/p cystectomy and ilial conduit formation known to IR for previous bilateral PCN placement on 6/23 with Dr. Jennefer. He later underwent retrograde right-sided ureteral stent placement with Dr. KANDICE Banner on 8/28. His right PCN was able to be capped and patient was ultimately discharged to rehab.   Received a call from one of the PMR APPs, Sharlet Schmitz, requesting bedside evaluation of patients nephrostomy tubes due to new hematuria. Patient evaluated at the bedside. Reports left PCN continues to function without difficulty, but he has been having some discomfort around the insertion site.   Left PCN insertion site is minimally erythematous. Stat lock is soaked. Removed stat lock and recommended regular dressing changes to prevent infection. Nurse verbalized understanding.  Right PCN remains capped and insertion site is unremarkable.  Ileal conduit noted to have ~150cc blood-tinged urine in bag. Discussed with APP that as long as he continues to have output, the drain is continuing to function appropriately. If there is concern that patient has an acute bleed, would recommend trending H/H and re-imaging. Labs appear stable so far and patient reports no issues with his conduit. Recommend reaching out to Urology if there are concerns about the conduit itself or bloody output continues to increase. Team is agreeable with this plan.   IR will sign off.   Please feel free to reach out to our team with any additional questions or concerns.   Electronically Signed: Hillery Bhalla M Jame Seelig, PA-C 07/21/2024, 2:38 PM

## 2024-07-21 NOTE — Progress Notes (Signed)
 Occupational Therapy Session Note  Patient Details  Name: Kristopher Thompson MRN: [id number] Date of Birth: [birth year]  Today's Date: 07/21/2024 OT Individual Time: 8969-8884 OT Individual Time Calculation (min): 45 min    Short Term Goals: Week 2:  OT Short Term Goal 1 (Week 2): Pt will stand from w/c with mod A OT Short Term Goal 2 (Week 2): Pt will complete LB dressing with mod A OT Short Term Goal 3 (Week 2): Pt will complete ADL routine with no more than 2 rest breaks OT Short Term Goal 4 (Week 2): Pt will complete stand pivot transfer to w/c with mod A using LRAD  Skilled Therapeutic Interventions/Progress Updates:    Pt received sitting in the w/c with 1/10 pain in his R flank, no request for intervention. Discussed d/c with wife/pt. He had slow processing and fatigue that limited performance today. Patient required increased time for initiation, cuing, rest breaks, and for completion of tasks throughout session. Utilized therapeutic use of self throughout to promote efficiency. Pt was taken via w/c to the therapy gym for time management. He completed 2 sets of 7 ft, then 8 ft, of functional mobility with the Elyn walker to promote increase functional activity tolerance, generalized conditioning/strengthening, and core stability for carryover to performance with ADLs. He required max A +2 to stand, and then mod A +2 once on his feet. Very poor activity tolerance overall and pt required max cueing to participate/push himself. Pt returned to their room following. Pt was left sitting up in the wheelchair with all needs met and call bell within reach.    Therapy Documentation Precautions:  Precautions Precautions: Fall (HOH, Urostomy, Colostomy, PCN  drain, and orthostatic hypotension.) Recall of Precautions/Restrictions: Impaired Precaution/Restrictions Comments: urostomy, colostomy, lt PCN drain, watch BP and HR Restrictions Weight Bearing Restrictions Per Provider Order:  No  Therapy/Group: Individual Therapy  [name] 07/21/2024, 7:51 AM

## 2024-07-21 NOTE — Progress Notes (Signed)
 Patient ID: Kristopher Thompson, male   DOB: Nov 12, 1951, 73 y.o.   MRN: [id number]  SW made efforts to meet with pt in room to provide updates from team conference but patient care in process. SW will followup at a later time.   68- SW spoke with pt wife to inform on d/c date 9/26. She is aware we will work on a [facility] that can accept for care needs.   Kristopher Thompson, MSW, LCSW Office: [phone] Cell: [phone] Fax: [phone]

## 2024-07-21 NOTE — Patient Care Conference (Incomplete)
 Inpatient RehabilitationTeam Conference and Plan of Care Update Date: 07/21/2024   Time: 1012 am    Patient Name: Kristopher Thompson      Medical Record Number: [id number]  Date of Birth: 27-Apr-1951 Sex: Male         Room/Bed: 4W26C/4W26C-01 Payor Info: Payor: MEDICARE / Plan: MEDICARE PART A AND B / Product Type: *No Product type* /    Admit Date/Time:  07/11/2024 12:40 PM  Primary Diagnosis:  Critical illness myopathy  Hospital Problems: Principal Problem:   Critical illness myopathy Active Problems:   Cognitive and behavioral changes   Malnutrition of moderate degree    Expected Discharge Date: Expected Discharge Date: 08/07/24  Team Members Present: Physician leading conference: Dr. 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 LBM; 07/20/24   Avoid dehydration and electrolyte imblance.   Assess bowel and bladder needs q shift and PRN.    Swallow/Nutrition/ Hydration   Dys3/thin liquids with a double swallow, limited by nausea and poor appetite   minA  Ongoing diet tolerance, consistency of compensatory strategies, pharyngeal strengtening exercises    ADL's   Min A UB ADLs, max A Lb ADLs, wife manages drains/toileting, mod-max A ADL transfers   Goals downgraded to Supervision to CGA- more realistic   ADLs, transfers, balance, conditioning, activity tolerance    Mobility   maxA bed mobility, maxA +2 sit to stand, maxA to totalA transfers, maxA +2 gait x10' with RW   Supervision  global strengthening, bed mobility, transfers, ambulation, endurance    Communication                Safety/Cognition/ Behavioral Observations  attention, information processing,  awareness, problem solving - requiring min assist for recall and problem solving   supervisionA   pt/family education, cognitive retraining, functional cognitive tasks    Pain   Patient denies pain.   Patient rates pain < or equal to 3/10.   Assess pain q shift and PRN.    Skin   Midline abdominal incision (pink)-scant serosanguineous drainage. Right lateral abdomen(previous JP drain site)-(wound is yellow and pink)-small amount of serosanguineous drainage. Left and right nephrostomy tubes(skin pink around nephrostomy sites)- scant amount of purulent drainage coming from around left and right nephrostomy tubes. RUQ ileostomy(pink around site). RLQ-urostomy(skin pink around site)- scant amount of purulent drainage coming from site. Left and Right buttock-(pink)skin intact.   Keep skin from further skin breakdown and infection.  Assess skin q shift and PRN.      Discharge Planning:  D/c to home with wife who is primary caregiver. Pt will d/c on IVabx until 10/2. [facility] in place from previous admission. SW will confirm there are no barriers to discharge.    Team Discussion: *** Patient on target to meet rehab goals: {IP REHAB YES/NO WITH TPOIRJMID:75863}  *See Care Plan and progress notes for long and short-term goals.   Revisions to Treatment Plan:  ***  Teaching Needs: ***  Current Barriers to Discharge: {BARRIERS TO IPDRYJMHZ:75864}  Possible Resolutions to Barriers: ***     Medical Summary               I attest that I was present, lead the team conference, and concur  with the assessment and plan of the team.   Dorathy Stallone Gayo 07/21/2024, 3:19 PM

## 2024-07-21 NOTE — Progress Notes (Signed)
 Speech Language Pathology Daily Session Note  Patient Details  Name: Kristopher Thompson MRN: [id number] Date of Birth: [birth year]  Today's Date: 07/21/2024 SLP Individual Time: 0203-0247 SLP Individual Time Calculation (min): 44 min  Short Term Goals: Week 2: SLP Short Term Goal 1 (Week 2): Patient will improve problem solving and planning skills inmoderate complexity tasks when provided min A. SLP Short Term Goal 2 (Week 2): Patient will recall safe swallow strategies (upright for PO, alternate solids/liquids, slow rate, small bites/sips) with 80% acc when provided min A. SLP Short Term Goal 3 (Week 2): Patient will tolerate Dys 3 diet textures with reduced reguritation episodes using safe swallow strategies. SLP Short Term Goal 4 (Week 2): Patient will complete pharyngeal strengthening exercises as appropriate given mod A in order to improve swallow function SLP Short Term Goal 5 (Week 2): Patient will use compensatory memory strategies to recall functional information in the current environment when provided min A.  Skilled Therapeutic Interventions:   Patient was seen in PM to address cognitive re- training and dysphagia management. Pt was alert and seated up in bed. He denied pain and nausea. Though pt denied nausea he declined solid trials in anticipation that nausea and vomiting would occur. Pt agreeable to take sips of thin liquid with no overt s/s aspiration though unable to r/o silent aspiration. SLP continued to address memory this session with pt able to recall participation in PT and OT sessions along with specific tasks completed given sup A. He was subsequently challenged in a paragraph retention task of moderate degree, SLP guided pt in use of repetition and association strategies. Time of distracted delay increased to 30 minutes due to success in previous session. After a 30 minute distracted delay, pt recalled information with 100% acc indep. In other minutes of session, SLP  challenged pt in moderate complexity problem solving. Pt was challenged in word problem money management tasks which he completed with 92% acc indep which did not improve with additional cues. At conclusion of session, pt was left upright in bed with call button within reach and wife present. SLP to continue POC.   Pain Pain Assessment Pain Scale: 0-10 Pain Score: 0-No pain  Therapy/Group: Individual Therapy  [name] 07/21/2024, 2:10 PM

## 2024-07-21 NOTE — Progress Notes (Signed)
 Physical Therapy Session Note  Patient Details  Name: Kristopher Thompson MRN: [id number] Date of Birth: 07/26/1951  Today's Date: 07/21/2024 PT Individual Time: 0806-0859 PT Individual Time Calculation (min): 53 min   Today's Date: 07/21/2024 PT Individual Time: 1449-1530 PT Individual Time Calculation (min): 41 min   Short Term Goals: Week 1:  PT Short Term Goal 1 (Week 1): pt will perform bed mobility with min A with LRAD PT Short Term Goal 2 (Week 1): pt will perform sit to stand with mod A or less PT Short Term Goal 3 (Week 1): pt will initiate gait training  Skilled Therapeutic Interventions/Progress Updates:     Pt received semi reclined in bed and agrees to therapy. No complaint of pain. PT assists to don thigh high ted hose prior to mobility. Pt given increased time to complete bed mobility to allow pt to perform as much mobility as possible without assistance. After several minutes, pt still unable to bring legs over the side of bed, eventually requiring maxA to complete sit to stand. While preparing to transfer to [facility], pt loses trunk control and leans backward, requiring maxA to return to upright positioning. Pt retropulses throughout session and has minimal ability to flex trunk to prepare for mobility, despite max cues. Pt performs partial stand step transfer to [facility] but reaches for handle and attempts to sit prior to being safely in front of chair, requiring maxA to safely bring hips to support. Pt unable to adjust posture in chair despite cues, and requires total to bring hips to neutral position, and pt demonstrating consistent Rt sided trunk lean that he is unable to correct. WC transport to gym. Pt performs x20 LAQs to prepare knee extensors for mobility. Pt lacks >10 degrees extension in both knees. Pt then completes sit to stand with maxA +2 and remains standing 30 seconds with RW for upper extremity support, and max cues to extend hips, trunk, and cervical spine. After extended  seated rest break, pt performs additional bout of standing for ~40 seconds. Pt performs final bout of standing ~40 seconds with similar assistance and cues. Left seated in WC with all needs within reach.   2nd Session: Pt received semi reclined in bed and agrees to therapy. No complaint of pain and appears less lethargic than AM session with this therapist. Prior to mobilizing pt's wife assists to empty colostomy bag. Pt performs supine to sit with maxA and cues for logrolling and sequencing. Pt initially retropulses strongly while seated at EOB and requires modA to maxA to correct. Pt performs sit to stand from significantly elevated bed with modA and cues for hand placement. Pt requires maxA during transfer to due lunging for [facility] and demonstrating impaired safety awareness. PT educates on importance of not attempting to sit until legs are touching chair and pt does not verbally respond. WC transport to gym. Pt performs x2 reps sit to stand during session with modA/maxA +2 and facilitation of anterior weight shifting and powering up, with cues for hand placement and initiation. Pt ambulates x15' and x50' with modA +2 and +3 for WC follow for safety. Pt requires verbal and tactile encouragement when ambulating due to tendency to request to sit down very quickly upon standing, but is able to complete much more distance when encouraged. WC transport back to room. Stand step to bed with modA/maxA +2 and pt again demonstrating tendency to lunge for surface prior to being safely in position. TotalA +2 for sit to supine. Pt left  supine with all needs within reach.  Therapy Documentation Precautions:  Precautions Precautions: Fall (HOH, Urostomy, Colostomy, PCN  drain, and orthostatic hypotension.) Recall of Precautions/Restrictions: Impaired Precaution/Restrictions Comments: urostomy, colostomy, lt PCN drain, watch BP and HR Restrictions Weight Bearing Restrictions Per Provider Order: No   Therapy/Group:  Individual Therapy  Elsie JAYSON Dawn, PT, DPT 07/21/2024, 4:29 PM

## 2024-07-21 NOTE — Progress Notes (Signed)
 Reglan  d/c--has been used in last 24 hours for nausea but not really using compazine . Will d/c as reported to be sleepy, leaning. Has been refusing psyllium for bulking of stools--->changed to fibercon for compliance.  Midodrine  timing adjusted for day as has been skipping doses in evenings. Intake remains poor but is taking supplements about once a day for past couple of days. Offer boost as alternative.

## 2024-07-21 NOTE — Progress Notes (Signed)
 PROGRESS NOTE   Subjective/Complaints:  Appreciate ID note Per PT pt more drowsy, sitting balance worsened but BP has been stable  Wife noted blood in nephrostomy bag, pt in therapy gym could not visualized, no nsg notes  Reviewed meds  ROS: Patient denies fever, rash, sore throat, blurred vision, dizziness, nausea, vomiting, diarrhea, cough, shortness of breath or chest pain,  headache, or mood change.    Objective:   No results found.  Recent Labs    07/20/24 0506  WBC 8.2  HGB 11.9*  HCT 36.5*  PLT 343    Recent Labs    07/19/24 0505 07/20/24 0506  NA 136 135  K 3.6 4.5  CL 107 111  CO2 17* 17*  GLUCOSE 91 88  BUN 41* 42*  CREATININE 1.87* 1.97*  CALCIUM  9.8 9.9    Intake/Output Summary (Last 24 hours) at 07/21/2024 0828 Last data filed at 07/21/2024 0400 Gross per 24 hour  Intake 550.82 ml  Output 1850 ml  Net -1299.18 ml        Physical Exam: Vital Signs Blood pressure 108/63, pulse 77, temperature 98.5 F (36.9 C), temperature source Oral, resp. rate 18, height 5' 11 (1.803 m), weight 85 kg, SpO2 98%.   General: No acute distress Mood and affect are appropriate Heart: Regular rate and rhythm no rubs murmurs or extra sounds Lungs: Clear to auscultation, breathing unlabored, no rales or wheezes Abdomen: Positive bowel sounds, soft nontender to palpation, nondistended Extremities: No clubbing, cyanosis, or edema Skin: No evidence of breakdown, no evidence of rash   Skin: abdominal wound continues to close with pink with granulation tissue. Uro: ostomies in place with clear output remains yellow/clear  Neuro:  Alert and oriented x 3. Normal insight and awareness. Intact Memory. Normal language and speech. Cranial nerve exam unremarkable. MMT: BUE 4/5 prox to distal. BLE 3-/5 HF, 4-KE and 4/5 ADF/PF.  Needs encouragement for full effort No limb ataxia or cerebellar signs. No abnormal tone  appreciated.  Musculoskeletal: both knees remain tight in about 20 degrees flexion contractures--   Assessment/Plan: 1. Functional deficits which require 3+ hours per day of interdisciplinary therapy in a comprehensive inpatient rehab setting. Physiatrist is providing close team supervision and 24 hour management of active medical problems listed below. Physiatrist and rehab team continue to assess barriers to discharge/monitor patient progress toward functional and medical goals  Care Tool:  Bathing    Body parts bathed by patient: Chest, Face, Abdomen, Right arm, Left arm, Front perineal area, Right upper leg, Left upper leg   Body parts bathed by helper: Buttocks, Right lower leg, Left lower leg     Bathing assist Assist Level: Moderate Assistance - Patient 50 - 74%     Upper Body Dressing/Undressing Upper body dressing   What is the patient wearing?: Hospital gown only    Upper body assist Assist Level: Moderate Assistance - Patient 50 - 74%    Lower Body Dressing/Undressing Lower body dressing      What is the patient wearing?: Pants     Lower body assist Assist for lower body dressing: Maximal Assistance - Patient 25 - 49%     Toileting Toileting  Toileting assist Assist for toileting:  (patient has colosteomy and urostomy)     Transfers Chair/bed transfer  Transfers assist  Chair/bed transfer activity did not occur: Safety/medical concerns  Chair/bed transfer assist level: Maximal Assistance - Patient 25 - 49%     Locomotion Ambulation   Ambulation assist   Ambulation activity did not occur: Safety/medical concerns (fatigue/dizziness/orthostatics)          Walk 10 feet activity   Assist  Walk 10 feet activity did not occur: Safety/medical concerns (fatigue/dizziness/orthostatics)        Walk 50 feet activity   Assist Walk 50 feet with 2 turns activity did not occur: Safety/medical concerns (fatigue/dizziness/orthostatics)          Walk 150 feet activity   Assist Walk 150 feet activity did not occur: Safety/medical concerns (fatigue/dizziness/orthostatics)         Walk 10 feet on uneven surface  activity   Assist Walk 10 feet on uneven surfaces activity did not occur: Safety/medical concerns (fatigue/dizziness/orthostatics)         Wheelchair     Assist Is the patient using a wheelchair?: Yes Type of Wheelchair: Manual           Wheelchair 50 feet with 2 turns activity    Assist    Wheelchair 50 feet with 2 turns activity did not occur: Safety/medical concerns       Wheelchair 150 feet activity     Assist  Wheelchair 150 feet activity did not occur: Safety/medical concerns       Blood pressure 108/63, pulse 77, temperature 98.5 F (36.9 C), temperature source Oral, resp. rate 18, height 5' 11 (1.803 m), weight 85 kg, SpO2 98%. Medical Problem List and Plan: 1. 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  H/o PE/Antithrombotics: -DVT/anticoagulation:  Pharmaceutical: Eliquis              -antiplatelet therapy: N/A 3. Pain Management: Oxycodone  prn.  4. Mood/Behavior/Sleep: LCSW to follow for evaluation and support.              --melatonin for insomnia.              -antipsychotic agents: N/A 5. Neuropsych/cognition: This patient is not capable of making decisions on his own behalf. 6. Skin/Wound Care: Routine pressure relief measures. Encourage adequate diet             --dressing changes daily. Gerhardt's cream to buttock wound.   -abdominal wound continues to close 7. Fluids/Electrolytes/Nutrition/oropharyngeal dysphagia:  Periactin  for appetite not doing much for him so far- may be causing sedation will d/c             --continue juven bid. Has been refusing Ensure supplement.   -kdur  20meq daily for potassium of 3.4  -can't use zofran  d/t QTc prolongation (490)  - MBS   9/3--D3/thins      Latest Ref Rng & Units 07/20/2024    5:06 AM 07/19/2024    5:05 AM 07/18/2024    4:57 AM  BMP  Glucose 70 - 99 mg/dL 88  91  89   BUN 8 - 23 mg/dL 42  41  39   Creatinine 0.61 - 1.24 mg/dL 8.02  8.12  2.12   Sodium 135 - 145 mmol/L 135  136  135   Potassium 3.5 - 5.1 mmol/L 4.5  3.6  3.3   Chloride 98 - 111 mmol/L 111  107  107   CO2 22 - 32 mmol/L 17  17  19    Calcium  8.9 - 10.3 mg/dL 9.9  9.8  89.8       -prealbumin suprizingly normal at 20 on 9/8 8. Sepsis/Abdominal wall/mesh infection: IV Vanc/Ceftriaxone  for 6 weeks from 08/21             --Probable 6 months total of antibiotics--ID follow up 09/18 @10  am --Corynebacterium species-->6 more weeks ?Dalbavancin for OP use, cont ceftriazone and linezolid  for now  -ESR 50, CRP 0.9  CRP nl 0.8 ESR 44 WBC 8.2 9. Left> right hydronephrosis: Nephrostomy tubes with stent exchanged on 08/27 --continue clamping right tube and left to drain per urology.  Monitor for blood recheck CBC in am  10. Acute on chronic renal failure:  elevated CR- baseline creat probably ~1.4   -Cr finally demonstrated some improvement today   -could be related to vanc---ID discussion as above   -continue IVF and serial labs See above stable  11. OSA: Encourage CPAP use 12. Hypotension/tachycardia: Continue Midodrine  15 mg TID and Florinef  2 mg daily             --continues to have significant tachycardia with activity likely due to orthostasis and deconditioning.  -9/4 -some improvement   -volume/renal   at current baseline   -hgb 11.5   -continue metoprolol  low dose 12.5mg  bid, bp remains soft but stable at present   -  IVF as above Per PT- has been not dropping in therapy  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    9/1 held reglan  14. Depression: On Zoloft  15. Malnutrition: Albumin  @1 .9/TP-5.1.   16. Anemia of chronic illness: Stable  overall 9/8       Latest Ref Rng & Units 07/20/2024    5:06 AM 07/16/2024    5:13 AM 07/13/2024    5:46 AM  CBC  WBC 4.0 - 10.5 K/uL 8.2  8.8  10.3   Hemoglobin 13.0 - 17.0 g/dL 88.0  88.4  88.4   Hematocrit 39.0 - 52.0 % 36.5  34.7  35.7   Platelets 150 - 400 K/uL 343  303  245          LOS: 10 days A FACE TO FACE EVALUATION WAS PERFORMED  Prentice BRAVO Ellayna Hilligoss 07/21/2024, 8:28 AM

## 2024-07-21 NOTE — Plan of Care (Signed)
  Problem: Consults Goal: RH GENERAL PATIENT EDUCATION Description: See Patient Education module for education specifics. Outcome: Progressing   Problem: RH BOWEL ELIMINATION Goal: RH STG MANAGE BOWEL WITH ASSISTANCE Description: STG Manage Bowel with supervision Assistance. Outcome: Progressing   Problem: RH SAFETY Goal: RH STG ADHERE TO SAFETY PRECAUTIONS W/ASSISTANCE/DEVICE Description: STG Adhere to Safety Precautions With Assistance/Device. Outcome: Progressing   Problem: RH KNOWLEDGE DEFICIT GENERAL Goal: RH STG INCREASE KNOWLEDGE OF SELF CARE AFTER HOSPITALIZATION Description: Manage increase knowledge of self care care after hospitalization with supervision assistance from wife using educational materials provided Outcome: Progressing

## 2024-07-22 DIAGNOSIS — N179 Acute kidney failure, unspecified: Secondary | ICD-10-CM

## 2024-07-22 DIAGNOSIS — N189 Chronic kidney disease, unspecified: Secondary | ICD-10-CM

## 2024-07-22 DIAGNOSIS — D638 Anemia in other chronic diseases classified elsewhere: Secondary | ICD-10-CM

## 2024-07-22 LAB — CBC WITH DIFFERENTIAL/PLATELET
Abs Immature Granulocytes: 0.08 K/uL — ABNORMAL HIGH (ref 0.00–0.07)
Basophils Absolute: 0.2 K/uL — ABNORMAL HIGH (ref 0.0–0.1)
Basophils Relative: 2 %
Eosinophils Absolute: 0.8 K/uL — ABNORMAL HIGH (ref 0.0–0.5)
Eosinophils Relative: 10 %
HCT: 37.2 % — ABNORMAL LOW (ref 39.0–52.0)
Hemoglobin: 11.9 g/dL — ABNORMAL LOW (ref 13.0–17.0)
Immature Granulocytes: 1 %
Lymphocytes Relative: 21 %
Lymphs Abs: 1.8 K/uL (ref 0.7–4.0)
MCH: 26.7 pg (ref 26.0–34.0)
MCHC: 32 g/dL (ref 30.0–36.0)
MCV: 83.4 fL (ref 80.0–100.0)
Monocytes Absolute: 1.4 K/uL — ABNORMAL HIGH (ref 0.1–1.0)
Monocytes Relative: 16 %
Neutro Abs: 4.3 K/uL (ref 1.7–7.7)
Neutrophils Relative %: 50 %
Platelets: 336 K/uL (ref 150–400)
RBC: 4.46 MIL/uL (ref 4.22–5.81)
RDW: 15.1 % (ref 11.5–15.5)
WBC: 8.5 K/uL (ref 4.0–10.5)
nRBC: 0 % (ref 0.0–0.2)

## 2024-07-22 MED ORDER — SODIUM CHLORIDE 0.45 % IV SOLN: INTRAVENOUS | Status: DC

## 2024-07-22 MED ORDER — MEGESTROL ACETATE 400 MG/10ML PO SUSP
400.0000 mg | Freq: Two times a day (BID) | ORAL | Status: DC
Start: 2024-07-22 — End: 2024-07-25
  Administered 2024-07-22 – 2024-07-25 (×7): 400 mg via ORAL
  Filled 2024-07-22 (×7): qty 10

## 2024-07-22 NOTE — Progress Notes (Signed)
 Speech Language Pathology Daily Session Note  Patient Details  Name: Kristopher Thompson MRN: [id number] Date of Birth: 1950-12-25  Today's Date: 07/22/2024 SLP Individual Time: 0800-0830 SLP Individual Time Calculation (min): 30 min  Short Term Goals: Week 2: SLP Short Term Goal 1 (Week 2): Patient will improve problem solving and planning skills inmoderate complexity tasks when provided min A. SLP Short Term Goal 2 (Week 2): Patient will recall safe swallow strategies (upright for PO, alternate solids/liquids, slow rate, small bites/sips) with 80% acc when provided min A. SLP Short Term Goal 3 (Week 2): Patient will tolerate Dys 3 diet textures with reduced reguritation episodes using safe swallow strategies. SLP Short Term Goal 4 (Week 2): Patient will complete pharyngeal strengthening exercises as appropriate given mod A in order to improve swallow function SLP Short Term Goal 5 (Week 2): Patient will use compensatory memory strategies to recall functional information in the current environment when provided min A.  Skilled Therapeutic Interventions:   Pt and his wife greeted at bedside. He was awake in bed, eating breakfast. Tx tasks targeted cognition and dysphagia. Pt provided w/ continued education re safe swallow strategies, including upright positioning, as he was semi reclined in bed while eating. No overt s/s of airway invasion noted, though silent aspiration x1 noted on MBSS. Pharyngeal strengthening exercise x1 introduced. After demonstration, he completed masako x10 w/ minA cues to maintain adequate technique. Of note, he appeared slower to respond and less attentive than prev tx sessions. Nursing notified. At the end of tx tasks, he was left in bed w/ the alarm set and call light within reach. Recommend cont ST per POC.   Pain  Pt reported pain @ ostomy site. Received pain medications prior to ST tx session.   Therapy/Group: Individual Therapy  Kristopher Thompson 07/22/2024, 4:21  PM

## 2024-07-22 NOTE — Progress Notes (Addendum)
 Physical Therapy Session Note  Patient Details  Name: TUVIA WOODRICK MRN: [id number] Date of Birth: [birth year]  Today's Date: 07/22/2024 PT Individual Time: 1000-1115, 1415-1500 PT Individual Time Calculation (min): 75 min, 45 min   Skilled Therapeutic Interventions/Progress Updates:   AM session:  Pt received upright in WC, agreeable to therapy.   Interventions: Pt wheeled down to main gym hallway, set goal to ambulate to TV w/ 2 breaks maximum. Pt agreed, required maxA +2 to stand w/ RW and lifting assist via hips, gait belt, BUE, 3rd person for [facility] follow. Pt required RW be propelled w/ maxA for RW propulsion, IV pole navigation, and max encouragement throughout gait. Pt ambulated 67ft, 59ft, 75ft w/ increased time for seated rest breaks. Pt demonstrates shuffled gait, decreased step height and step length, forward head posture, needing cues to look ahead and not at feet w/ poor carryover.  Pt self-propelled WC w/ BLE pushing backwards 152ft w/ minA to maintain WC in straight line, engaging B quadricep muscles to improve BLE strength, standing/gait tolerance.  Pt performed seated marching, LAQ w/ 2# ankle weights, isometric to eccentric lowering of foot from knee extension to flexion w/ 10# ankle weights to improve BLE strength and muscle activation. Pt able to initiate and perform LAQ w/ 10# weights through partial ROM.  At end of session, pt wheeled back to room, given call bell and all other needs in reach. Remained upright in WC.   PM Session:  Pt in WC and agreeable to therapy. Pt w/ no reports of pain throughout session. Pt wheeled to main gym and performed stand step transfer w/ maxA +2 w/ RW initially, progressed to total A +2 as pt unable to move feet, requiring lifting assist to finish transfer to mat table. Pt educated on importance of safety and fall risk.  Pt performed seated balance exercises to challenge trunk control in unsupported sitting, including passing 2kg ball from  L<>R, holding it out in front of him, and lifting it up to eye level. Pt given visual feedback to improve posture w/ mirror during exercises.   Pt performed sit to stand into stedy w/ max A +2, from stedy performed 3+ sit to stand transfers w/ max A +2, achieved improved hip extension w/ max posterior to anterior push at hips.   Pt wheeled back to room, given call bell and all other needs in reach.    Therapy Documentation Precautions:  Precautions Precautions: Fall (HOH, Urostomy, Colostomy, PCN  drain, and orthostatic hypotension.) Recall of Precautions/Restrictions: Impaired Precaution/Restrictions Comments: urostomy, colostomy, lt PCN drain, watch BP and HR Restrictions Weight Bearing Restrictions Per Provider Order: No    Therapy/Group: Individual Therapy  [name] 07/22/2024, 1:30 PM

## 2024-07-22 NOTE — Progress Notes (Signed)
 Occupational Therapy Session Note  Patient Details  Name: Kristopher Thompson MRN: [id number] Date of Birth: November 04, 1951  Today's Date: 07/22/2024 OT Individual Time: 9148-9054 OT Individual Time Calculation (min): 54 min    Short Term Goals: Week 2:  OT Short Term Goal 1 (Week 2): Pt will stand from w/c with mod A OT Short Term Goal 2 (Week 2): Pt will complete LB dressing with mod A OT Short Term Goal 3 (Week 2): Pt will complete ADL routine with no more than 2 rest breaks OT Short Term Goal 4 (Week 2): Pt will complete stand pivot transfer to w/c with mod A using LRAD  Skilled Therapeutic Interventions/Progress Updates:    Pt received supine with no c/o pain, slow processing and initiation. His wife assisted with emptying of ileostomy, urostomy and nephrostomy drains. Provided foot care- total A to wash and apply lotion, as well as reapply foam pads to heels. Skilled monitoring of vitals throughout session to ensure hemodynamic and cardiorespiratory stability- all stable during session. He required mod A and mod cueing for bed mobility to EOB. Patient required increased time for initiation, cuing, rest breaks, and for completion of tasks throughout session. Utilized therapeutic use of self throughout to promote efficiency. He worked on sit <> stands from EOB to address ADL transfers and functional activity tolerance.  He completed 5x sit <> stands from elevated EOB with mod +2 assist to stand and then to remain standing as well. Mod cueing required for upright trunk, pelvic tilt, and extending knees/head in standing. Verbal counting used to set attainable goal in standing- 25 sec each time following by a several min rest break. Stand pivot to the w/c with mod A using the RW. He completed oral care seated with set up assist. He was left sitting up in the w/c with all needs met. Wife present.   Therapy Documentation Precautions:  Precautions Precautions: Fall (HOH, Urostomy, Colostomy, PCN   drain, and orthostatic hypotension.) Recall of Precautions/Restrictions: Impaired Precaution/Restrictions Comments: urostomy, colostomy, lt PCN drain, watch BP and HR Restrictions Weight Bearing Restrictions Per Provider Order: No  Therapy/Group: Individual Therapy  [name] 07/22/2024, 9:38 AM

## 2024-07-22 NOTE — Plan of Care (Signed)
  Problem: Consults Goal: RH GENERAL PATIENT EDUCATION Description: See Patient Education module for education specifics. Outcome: Progressing   Problem: RH BOWEL ELIMINATION Goal: RH STG MANAGE BOWEL WITH ASSISTANCE Description: STG Manage Bowel with supervision Assistance. Outcome: Progressing   Problem: RH BLADDER ELIMINATION Goal: RH STG MANAGE BLADDER WITH ASSISTANCE Description: STG Manage Bladder With supervision Assistance Outcome: Progressing   Problem: RH SKIN INTEGRITY Goal: RH STG SKIN FREE OF INFECTION/BREAKDOWN Description: Manage skin free of infection with supervision assistance Outcome: Progressing   Problem: RH SAFETY Goal: RH STG ADHERE TO SAFETY PRECAUTIONS W/ASSISTANCE/DEVICE Description: STG Adhere to Safety Precautions With Assistance/Device. Outcome: Progressing   Problem: RH PAIN MANAGEMENT Goal: RH STG PAIN MANAGED AT OR BELOW PT'S PAIN GOAL Description: <4 w/ prns Outcome: Progressing

## 2024-07-22 NOTE — Progress Notes (Signed)
 PROGRESS NOTE   Subjective/Complaints:  Pt slept well per wife. Seems slow to awaken this am. Per chart pt has been a little more lethargic the last day or so. Ate little yesterday. Did drink supplements. Med adjustments yesterday per PA note.    Objective:   No results found.  Recent Labs    07/20/24 0506 07/22/24 0729  WBC 8.2 8.5  HGB 11.9* 11.9*  HCT 36.5* 37.2*  PLT 343 336    Recent Labs    07/20/24 0506  NA 135  K 4.5  CL 111  CO2 17*  GLUCOSE 88  BUN 42*  CREATININE 1.97*  CALCIUM  9.9    Intake/Output Summary (Last 24 hours) at 07/22/2024 0836 Last data filed at 07/22/2024 0800 Gross per 24 hour  Intake 418 ml  Output --  Net 418 ml        Physical Exam: Vital Signs Blood pressure 94/65, pulse 96, temperature 98.7 F (37.1 C), temperature source Oral, resp. rate 18, height 5' 11 (1.803 m), weight 85 kg, SpO2 99%.   General: No acute distress Mood and affect are appropriate Heart: Regular rate and rhythm no rubs murmurs or extra sounds Lungs: Clear to auscultation, breathing unlabored, no rales or wheezes Abdomen: Positive bowel sounds, soft nontender to palpation, nondistended Extremities: No clubbing, cyanosis, or edema Skin: No evidence of breakdown, no evidence of rash   Skin: abdominal wound continues to close with pink with granulation tissue. Uro: ostomies in place with clear output remains yellow/clear  Neuro:  Alert and oriented x 3. Normal insight and awareness. Intact Memory. Normal language and speech. Cranial nerve exam unremarkable. MMT: BUE 4/5 prox to distal. BLE 3-/5 HF, 4-KE and 4/5 ADF/PF.  Needs encouragement for full effort No limb ataxia or cerebellar signs. No abnormal tone appreciated.  Musculoskeletal: both knees remain tight in about 20 degrees flexion contractures--   Assessment/Plan: 1. Functional deficits which require 3+ hours per day of interdisciplinary  therapy in a comprehensive inpatient rehab setting. Physiatrist is providing close team supervision and 24 hour management of active medical problems listed below. Physiatrist and rehab team continue to assess barriers to discharge/monitor patient progress toward functional and medical goals  Care Tool:  Bathing    Body parts bathed by patient: Chest, Face, Abdomen, Right arm, Left arm, Front perineal area, Right upper leg, Left upper leg   Body parts bathed by helper: Buttocks, Right lower leg, Left lower leg     Bathing assist Assist Level: Moderate Assistance - Patient 50 - 74%     Upper Body Dressing/Undressing Upper body dressing   What is the patient wearing?: Hospital gown only    Upper body assist Assist Level: Moderate Assistance - Patient 50 - 74%    Lower Body Dressing/Undressing Lower body dressing      What is the patient wearing?: Pants     Lower body assist Assist for lower body dressing: Maximal Assistance - Patient 25 - 49%     Toileting Toileting    Toileting assist Assist for toileting:  (patient has colosteomy and urostomy)     Transfers Chair/bed transfer  Transfers assist  Chair/bed transfer activity did not  occur: Safety/medical concerns  Chair/bed transfer assist level: Maximal Assistance - Patient 25 - 49%     Locomotion Ambulation   Ambulation assist   Ambulation activity did not occur: Safety/medical concerns (fatigue/dizziness/orthostatics)          Walk 10 feet activity   Assist  Walk 10 feet activity did not occur: Safety/medical concerns (fatigue/dizziness/orthostatics)        Walk 50 feet activity   Assist Walk 50 feet with 2 turns activity did not occur: Safety/medical concerns (fatigue/dizziness/orthostatics)         Walk 150 feet activity   Assist Walk 150 feet activity did not occur: Safety/medical concerns (fatigue/dizziness/orthostatics)         Walk 10 feet on uneven surface   activity   Assist Walk 10 feet on uneven surfaces activity did not occur: Safety/medical concerns (fatigue/dizziness/orthostatics)         Wheelchair     Assist Is the patient using a wheelchair?: Yes Type of Wheelchair: Manual           Wheelchair 50 feet with 2 turns activity    Assist    Wheelchair 50 feet with 2 turns activity did not occur: Safety/medical concerns       Wheelchair 150 feet activity     Assist  Wheelchair 150 feet activity did not occur: Safety/medical concerns       Blood pressure 94/65, pulse 96, temperature 98.7 F (37.1 C), temperature source Oral, resp. rate 18, height 5' 11 (1.803 m), weight 85 kg, SpO2 99%. Medical Problem List and Plan: 1. 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  H/o PE/Antithrombotics: -DVT/anticoagulation:  Pharmaceutical: Eliquis              -antiplatelet therapy: N/A 3. Pain Management: Oxycodone  prn.  4. Mood/Behavior/Sleep: LCSW to follow for evaluation and support.              --melatonin for insomnia.              -antipsychotic agents: N/A 5. Neuropsych/cognition: This patient is not capable of making decisions on his own behalf. 6. Skin/Wound Care: Routine pressure relief measures. Encourage adequate diet             --dressing changes daily. Gerhardt's cream to buttock wound.   -abdominal wound continues to close despite tenous nutritional status 7. Fluids/Electrolytes/Nutrition/oropharyngeal dysphagia:                --9/10 periactin  yesterday stopped d/t lethargy   -will try megace  beginning today   -RD f/u   -push fluids   -recheck bmet in am   -continue juven bid. Has been refusing Ensure supplement.   -kdur 20meq daily   -can't use zofran  d/t QTc prolongation (490)  - MBS   9/3--D3/thins, prealbumin  borderline normal at 20      Latest Ref Rng & Units 07/20/2024    5:06 AM 07/19/2024    5:05 AM 07/18/2024    4:57 AM  BMP  Glucose 70 - 99 mg/dL 88  91  89   BUN 8 - 23 mg/dL 42  41  39   Creatinine 0.61 - 1.24 mg/dL 8.02  8.12  7.87   Sodium 135 -  145 mmol/L 135  136  135   Potassium 3.5 - 5.1 mmol/L 4.5  3.6  3.3   Chloride 98 - 111 mmol/L 111  107  107   CO2 22 - 32 mmol/L 17  17  19    Calcium  8.9 - 10.3 mg/dL 9.9  9.8  89.8         8. Sepsis/Abdominal wall/mesh infection: IV Vanc/Ceftriaxone  for 6 weeks from 08/21             --Probable 6 months total of antibiotics--ID follow up 09/18 @10  am --Corynebacterium species-->6 more weeks ?Dalbavancin for OP use, cont ceftriazone and linezolid  for now  per ID -appreciate recent ID f/u CRP nl 0.8 ESR 44 WBC 8.2 9. Left> right hydronephrosis: Nephrostomy tubes with stent exchanged on 08/27 --continue clamping right tube and left to drain per urology.  Monitor for blood recheck CBC in am  10. Acute on chronic renal failure:  elevated CR- baseline creat probably ~1.4   -9/10 will give addnl IVF today d/t poor po intake    -recheck bmet in am 11. OSA: Encourage CPAP use 12. Hypotension/tachycardia: Continue Midodrine  15 mg TID and Florinef  2 mg daily             --continues to have significant tachycardia with activity likely due to orthostasis and deconditioning.  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    9/1 held reglan  14. Depression: On Zoloft  15. Malnutrition: Albumin  @1 .9/TP-5.1.   16. Anemia of chronic illness: Stable overall 9/8       Latest Ref Rng & Units 07/22/2024    7:29 AM 07/20/2024    5:06 AM 07/16/2024    5:13 AM  CBC  WBC 4.0 - 10.5 K/uL 8.5  8.2  8.8   Hemoglobin 13.0 - 17.0 g/dL 88.0  88.0  88.4   Hematocrit 39.0 - 52.0 % 37.2  36.5  34.7    Platelets 150 - 400 K/uL 336  343  303          LOS: 11 days A FACE TO FACE EVALUATION WAS PERFORMED  Kristopher Thompson 07/22/2024, 8:36 AM

## 2024-07-22 NOTE — Plan of Care (Signed)
  Problem: RH SKIN INTEGRITY Goal: RH STG SKIN FREE OF INFECTION/BREAKDOWN Description: Manage skin free of infection with supervision assistance Outcome: Progressing   Problem: RH SAFETY Goal: RH STG ADHERE TO SAFETY PRECAUTIONS W/ASSISTANCE/DEVICE Description: STG Adhere to Safety Precautions With Assistance/Device. Outcome: Progressing   Problem: RH PAIN MANAGEMENT Goal: RH STG PAIN MANAGED AT OR BELOW PT'S PAIN GOAL Description: <4 w/ prns Outcome: Progressing

## 2024-07-23 LAB — CBC
HCT: 34.7 % — ABNORMAL LOW (ref 39.0–52.0)
Hemoglobin: 11.3 g/dL — ABNORMAL LOW (ref 13.0–17.0)
MCH: 26.8 pg (ref 26.0–34.0)
MCHC: 32.6 g/dL (ref 30.0–36.0)
MCV: 82.4 fL (ref 80.0–100.0)
Platelets: 351 K/uL (ref 150–400)
RBC: 4.21 MIL/uL — ABNORMAL LOW (ref 4.22–5.81)
RDW: 15.1 % (ref 11.5–15.5)
WBC: 8.5 K/uL (ref 4.0–10.5)
nRBC: 0 % (ref 0.0–0.2)

## 2024-07-23 LAB — BASIC METABOLIC PANEL WITH GFR
Anion gap: 10 (ref 5–15)
Anion gap: 12 (ref 5–15)
BUN: 57 mg/dL — ABNORMAL HIGH (ref 8–23)
BUN: 57 mg/dL — ABNORMAL HIGH (ref 8–23)
CO2: 16 mmol/L — ABNORMAL LOW (ref 22–32)
CO2: 15 mmol/L — ABNORMAL LOW (ref 22–32)
Calcium: 10 mg/dL (ref 8.9–10.3)
Calcium: 10 mg/dL (ref 8.9–10.3)
Chloride: 105 mmol/L (ref 98–111)
Chloride: 104 mmol/L (ref 98–111)
Creatinine, Ser: 2.58 mg/dL — ABNORMAL HIGH (ref 0.61–1.24)
Creatinine, Ser: 2.39 mg/dL — ABNORMAL HIGH (ref 0.61–1.24)
GFR, Estimated: 26 mL/min — ABNORMAL LOW (ref >60)
GFR, Estimated: 28 mL/min — ABNORMAL LOW (ref >60)
Glucose, Bld: 93 mg/dL (ref 70–99)
Glucose, Bld: 97 mg/dL (ref 70–99)
Potassium: 3.7 mmol/L (ref 3.5–5.1)
Potassium: 4 mmol/L (ref 3.5–5.1)
Sodium: 131 mmol/L — ABNORMAL LOW (ref 135–145)
Sodium: 131 mmol/L — ABNORMAL LOW (ref 135–145)

## 2024-07-23 LAB — HEPATIC FUNCTION PANEL
ALT: 13 U/L (ref 0–44)
AST: 17 U/L (ref 15–41)
Albumin: 2.2 g/dL — ABNORMAL LOW (ref 3.5–5.0)
Alkaline Phosphatase: 81 U/L (ref 38–126)
Bilirubin, Direct: 0.1 mg/dL (ref 0.0–0.2)
Indirect Bilirubin: 0.3 mg/dL (ref 0.3–0.9)
Total Bilirubin: 0.4 mg/dL (ref 0.0–1.2)
Total Protein: 5.5 g/dL — ABNORMAL LOW (ref 6.5–8.1)

## 2024-07-23 LAB — PREALBUMIN: Prealbumin: 15 mg/dL — ABNORMAL LOW (ref 18–38)

## 2024-07-23 MED ORDER — SODIUM CHLORIDE 0.9 % IV SOLN: INTRAVENOUS | Status: AC

## 2024-07-23 MED ORDER — ONDANSETRON HCL 4 MG/2ML IJ SOLN: 4.0000 mg | Freq: Three times a day (TID) | INTRAMUSCULAR | Status: DC

## 2024-07-23 MED ORDER — ONDANSETRON HCL 4 MG/2ML IJ SOLN
4.0000 mg | Freq: Three times a day (TID) | INTRAMUSCULAR | Status: DC
  Administered 2024-07-23 – 2024-07-27 (×12): 4 mg via INTRAVENOUS
  Filled 2024-07-23 (×13): qty 2

## 2024-07-23 MED ORDER — SODIUM CHLORIDE 0.9 % BOLUS PEDS
1000.0000 mL | Freq: Once | INTRAVENOUS | Status: AC
  Administered 2024-07-23: 1000 mL via INTRAVENOUS

## 2024-07-23 MED ORDER — SODIUM CHLORIDE 0.9 % IV SOLN: INTRAVENOUS | Status: DC

## 2024-07-23 NOTE — Progress Notes (Signed)
 Speech Language Pathology Daily Session Note  Patient Details  Name: Kristopher Thompson MRN: [id number] Date of Birth: February 13, 1951  Today's Date: 07/23/2024 SLP Individual Time: 1430-1500 SLP Individual Time Calculation (min): 30 min  Short Term Goals: Week 2: SLP Short Term Goal 1 (Week 2): Patient will improve problem solving and planning skills inmoderate complexity tasks when provided min A. SLP Short Term Goal 2 (Week 2): Patient will recall safe swallow strategies (upright for PO, alternate solids/liquids, slow rate, small bites/sips) with 80% acc when provided min A. SLP Short Term Goal 3 (Week 2): Patient will tolerate Dys 3 diet textures with reduced reguritation episodes using safe swallow strategies. SLP Short Term Goal 4 (Week 2): Patient will complete pharyngeal strengthening exercises as appropriate given mod A in order to improve swallow function SLP Short Term Goal 5 (Week 2): Patient will use compensatory memory strategies to recall functional information in the current environment when provided min A.  Skilled Therapeutic Interventions: SLP conducted skilled therapy session targeting swallowing goals. SLP facilitated completion of pharyngeal strengthening exercises. Patient benefited from max cues to complete [place] tongue hold swallow x10, CTAR series x3, and effortful swallow x6. Provided education re: importance of completing these exercises between meals daily. Care partner present and verbalized understanding.  Patient was left in room with call bell in reach and alarm set. SLP will continue to target goals per plan of care.        Pain  None endorsed  Therapy/Group: Individual Therapy  Leory Allinson, M.A., CCC-SLP  Maurie Musco A Sevastian Witczak 07/23/2024, 3:00 PM

## 2024-07-23 NOTE — Progress Notes (Signed)
 Occupational Therapy Session Note  Patient Details  Name: HARTWELL VANDIVER MRN: [id number] Date of Birth: 1951/05/23  Today's Date: 07/23/2024 OT Individual Time: 0904-1001 OT Individual Time Calculation (min): 57 min    Short Term Goals: Week 2:  OT Short Term Goal 1 (Week 2): Pt will stand from w/c with mod A OT Short Term Goal 2 (Week 2): Pt will complete LB dressing with mod A OT Short Term Goal 3 (Week 2): Pt will complete ADL routine with no more than 2 rest breaks OT Short Term Goal 4 (Week 2): Pt will complete stand pivot transfer to w/c with mod A using LRAD  Skilled Therapeutic Interventions/Progress Updates:  Patient agreeable to participate in OT session. Reports no pain level.   Patient participated in skilled OT session focusing on ADL completion and mobility. Patient found in supine ready for OT. Patient completed bed mobility max A . Patient demonstrated fair sitting balance with R side bias. Patient able to complete UB doffing mod A. UB donning max A to navigate lines and drains. Patient completed LB doffing total to dep A with 2 assist. LB bathing max to total A. OT provided washing to buttocks and lower legs.OT donned pants with dep A. Patient able to complete 4 x sit to stands with total A for self care. Patient completed squat pivot transfer max A.  OT provided encouragement to complete activities. patient completed grooming sitting at sink with SU. Left in chair alarm on all needs in reach.     Therapy Documentation Precautions:  Precautions Precautions: Fall (HOH, Urostomy, Colostomy, PCN  drain, and orthostatic hypotension.) Recall of Precautions/Restrictions: Impaired Precaution/Restrictions Comments: urostomy, colostomy, lt PCN drain, watch BP and HR Restrictions Weight Bearing Restrictions Per Provider Order: No  Therapy/Group: Individual Therapy  D'mariea L Radwan Cowley 07/23/2024, 7:22 AM

## 2024-07-23 NOTE — Plan of Care (Signed)
  Problem: Consults Goal: RH GENERAL PATIENT EDUCATION Description: See Patient Education module for education specifics. Outcome: Progressing   Problem: RH BOWEL ELIMINATION Goal: RH STG MANAGE BOWEL WITH ASSISTANCE Description: STG Manage Bowel with supervision Assistance. Outcome: Progressing   Problem: RH BLADDER ELIMINATION Goal: RH STG MANAGE BLADDER WITH ASSISTANCE Description: STG Manage Bladder With supervision Assistance Outcome: Progressing   Problem: RH SKIN INTEGRITY Goal: RH STG SKIN FREE OF INFECTION/BREAKDOWN Description: Manage skin free of infection with supervision assistance Outcome: Progressing   Problem: RH SAFETY Goal: RH STG ADHERE TO SAFETY PRECAUTIONS W/ASSISTANCE/DEVICE Description: STG Adhere to Safety Precautions With Assistance/Device. Outcome: Progressing   Problem: RH PAIN MANAGEMENT Goal: RH STG PAIN MANAGED AT OR BELOW PT'S PAIN GOAL Description: <4 w/ prns Outcome: Progressing   Problem: RH KNOWLEDGE DEFICIT GENERAL Goal: RH STG INCREASE KNOWLEDGE OF SELF CARE AFTER HOSPITALIZATION Description: Manage increase knowledge of self care care after hospitalization with supervision assistance from wife using educational materials provided Outcome: Progressing

## 2024-07-23 NOTE — Progress Notes (Signed)
 PROGRESS NOTE   Subjective/Complaints:  No events overnight.  Labs this a.m. with mild hyponatremia 131, otherwise BUN/creatinine increased to 57-2.58.  CO2 16. Patient's wife in room this a.m., denies current concerns but is worried about ongoing poor p.o. intakes.  Patient states he is primarily limited by nausea, no food aversions.  No abdominal pain.  Does have some pain in his back around nephrostomy tube site, which has been consistent and not increasing.  Agreeable to trying to increase p.o. intakes and fluids today.  Vitals stable     07/23/2024    5:19 AM 07/22/2024    8:12 PM 07/22/2024    1:31 PM  Vitals with BMI  Systolic 115 125 97  Diastolic 69 67 67  Pulse 66 70 83    P.o. intakes variable  Continent of bladder Ileostomy output grossly liquid, low but with poor p.o. intake.   Objective:   No results found.  Recent Labs    07/22/24 0729 07/23/24 0430  WBC 8.5 8.5  HGB 11.9* 11.3*  HCT 37.2* 34.7*  PLT 336 351    Recent Labs    07/23/24 0430  NA 131*  K 3.7  CL 105  CO2 16*  GLUCOSE 93  BUN 57*  CREATININE 2.58*  CALCIUM  10.0    Intake/Output Summary (Last 24 hours) at 07/23/2024 1000 Last data filed at 07/23/2024 0500 Gross per 24 hour  Intake 490 ml  Output 1375 ml  Net -885 ml        Physical Exam: Vital Signs Blood pressure 115/69, pulse 66, temperature 97.8 F (36.6 C), resp. rate 16, height 5' 11 (1.803 m), weight 85 kg, SpO2 100%.   General: No acute distress.  Sitting up in bedside chair. Mood and affect are appropriate Heart: Regular rate and rhythm no rubs murmurs or extra sounds Lungs: Clear to auscultation, breathing unlabored, no rales or wheezes. Abdomen: Hypoactive bowel sounds, soft nontender to palpation, nondistended. + Liquid brown output from ileostomy + Yellow output with some mild thickness on left nephrostomy tube Extremities: No clubbing, or edema.   Chronic discoloration bilateral ankles indicative of vascular disease.  Brisk capillary refill. Skin: No evidence of breakdown, no evidence of rash   Skin: abdominal wound continues to close with pink with granulation tissue--operating clean, dry dressing from 9-10.  Neuro:  Alert and oriented x 3.  Somewhat altered insight and awareness, cognitively delayed. Normal language and speech.  Cranial nerve exam unremarkable.  MMT: BUE 4/5 prox to distal. BLE 3-/5 HF, 4-KE and 4/5 ADF/PF--unchanged from prior exam, with some poor effort No limb ataxia or cerebellar signs.  Bilateral upper and lower extremity intention tremors. No abnormal tone appreciated.   Musculoskeletal: both knees remain tight in about 20 degrees flexion contractures--unchanged   Assessment/Plan: 1. Functional deficits which require 3+ hours per day of interdisciplinary therapy in a comprehensive inpatient rehab setting. Physiatrist is providing close team supervision and 24 hour management of active medical problems listed below. Physiatrist and rehab team continue to assess barriers to discharge/monitor patient progress toward functional and medical goals  Care Tool:  Bathing    Body parts bathed by patient: Chest, Face, Abdomen, Right  arm, Left arm, Front perineal area, Right upper leg, Left upper leg   Body parts bathed by helper: Buttocks, Right lower leg, Left lower leg     Bathing assist Assist Level: Moderate Assistance - Patient 50 - 74%     Upper Body Dressing/Undressing Upper body dressing   What is the patient wearing?: Hospital gown only    Upper body assist Assist Level: Moderate Assistance - Patient 50 - 74%    Lower Body Dressing/Undressing Lower body dressing      What is the patient wearing?: Pants     Lower body assist Assist for lower body dressing: Maximal Assistance - Patient 25 - 49%     Toileting Toileting    Toileting assist Assist for toileting:  (patient has colosteomy  and urostomy)     Transfers Chair/bed transfer  Transfers assist  Chair/bed transfer activity did not occur: Safety/medical concerns  Chair/bed transfer assist level: Maximal Assistance - Patient 25 - 49%     Locomotion Ambulation   Ambulation assist   Ambulation activity did not occur: Safety/medical concerns (fatigue/dizziness/orthostatics)          Walk 10 feet activity   Assist  Walk 10 feet activity did not occur: Safety/medical concerns (fatigue/dizziness/orthostatics)        Walk 50 feet activity   Assist Walk 50 feet with 2 turns activity did not occur: Safety/medical concerns (fatigue/dizziness/orthostatics)         Walk 150 feet activity   Assist Walk 150 feet activity did not occur: Safety/medical concerns (fatigue/dizziness/orthostatics)         Walk 10 feet on uneven surface  activity   Assist Walk 10 feet on uneven surfaces activity did not occur: Safety/medical concerns (fatigue/dizziness/orthostatics)         Wheelchair     Assist Is the patient using a wheelchair?: Yes Type of Wheelchair: Manual           Wheelchair 50 feet with 2 turns activity    Assist    Wheelchair 50 feet with 2 turns activity did not occur: Safety/medical concerns       Wheelchair 150 feet activity     Assist  Wheelchair 150 feet activity did not occur: Safety/medical concerns       Blood pressure 115/69, pulse 66, temperature 97.8 F (36.6 C), resp. rate 16, height 5' 11 (1.803 m), weight 85 kg, SpO2 100%. Medical Problem List and Plan: 1. 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  Undergoing medical workups as below, will monitor improvement for the next  couple of days --address at teams if current level of care exceeds ability of home caregivers.  2.  H/o PE/Antithrombotics: -DVT/anticoagulation:  Pharmaceutical: Eliquis              -antiplatelet therapy: N/A  - No further blood appreciated in nephrostomy tubes  3. Pain Management: Oxycodone  prn.  4. Mood/Behavior/Sleep: LCSW to follow for evaluation and support.              --melatonin for insomnia.              -antipsychotic agents: N/A  - 9-11: Increasingly withdrawn,  behavioral changes may be secondary to poor nutritional intake and hyponatremia.  Treating as below, on sertraline  100 mg daily, may need to add additional agent such as mirtazapine but hesitant given ongoing cognitive issues.  5. Neuropsych/cognition: This patient is not capable of making decisions on his own behalf. 6. Skin/Wound Care: Routine pressure relief measures. Encourage adequate diet             --dressing changes daily. Gerhardt's cream to buttock wound.   -abdominal wound continues to close despite tenous nutritional status 7. Fluids/Electrolytes/Nutrition/oropharyngeal dysphagia:                --9/10 periactin  yesterday stopped d/t lethargy   -will try megace  beginning today   -RD f/u   -push fluids   -recheck bmet in am   -continue juven bid. Has been refusing Ensure supplement.   -kdur 20meq daily   -can't use zofran  d/t QTc prolongation (490)  - MBS   9/3--D3/thins, prealbumin borderline normal at 20      Latest Ref Rng & Units 07/23/2024    4:30 AM 07/20/2024    5:06 AM 07/19/2024    5:05 AM  BMP  Glucose 70 - 99 mg/dL 93  88  91   BUN 8 - 23 mg/dL 57  42  41   Creatinine 0.61 - 1.24 mg/dL 7.41  8.02  8.12   Sodium 135 - 145 mmol/L 131  135  136   Potassium 3.5 - 5.1 mmol/L 3.7  4.5  3.6   Chloride 98 - 111 mmol/L 105  111  107   CO2 22 - 32 mmol/L 16  17  17    Calcium  8.9 - 10.3 mg/dL 89.9  9.9  9.8     - 0-88: Nausea remains a primary barrier to adequate p.o. intakes.  Recheck EKG today,  QTc was 460, scheduled Zofran  4 mg IV 3 times daily AC.  Will repeat EKG in next 1 to 2 days.     8. Sepsis/Abdominal wall/mesh infection: IV Vanc/Ceftriaxone  for 6 weeks from 08/21             --Probable 6 months total of antibiotics--ID follow up 09/18 @10  am --Corynebacterium species-->6 more weeks ?Dalbavancin for OP use, cont ceftriazone and linezolid  for now  per ID -appreciate recent ID f/u CRP nl 0.8 ESR 44 WBC 8.2 9-11: WBC labs remained stable.  9. Left> right hydronephrosis: Nephrostomy tubes with stent exchanged on 08/27 --continue clamping right tube and left to drain per urology.  Monitor for blood recheck CBC in am --hemoglobin stable, no further bloody output appreciated  10. Acute on chronic renal failure/hyponatremia:  elevated CR- baseline creat probably ~1.4   -9/10 will give addnl IVF today d/t poor po intake    -recheck bmet in am   - 9-11: AKI significantly worse, now with hyponatremia 131.  Will give 1 L fluid bolus normal saline today, recheck BMP; given brisk uptrend despite increase in IV fluids will consult nephrology for further recommendations.  11. OSA: Encourage CPAP use 12. Hypotension/tachycardia: Continue Midodrine  15 mg TID and Florinef  2 mg daily             --continues to have significant tachycardia with activity likely due to orthostasis and deconditioning.  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    9/1 held reglan   - may need to resume if not tolerating zofran ; output remains grossly liquid  14. Depression: On Zoloft  15. Malnutrition: Albumin  @1 .9/TP-5.1.  --> 2.2 9/11;  - 9-11: Adding Premeal Zofran  as above.  Will get nutrition to start calorie count; may need additional supplementation or discussion of feeding tube if does not improve.  16. Anemia of chronic illness: Stable  overall 9/8-11       Latest Ref Rng & Units 07/23/2024    4:30 AM 07/22/2024    7:29 AM 07/20/2024    5:06 AM  CBC  WBC 4.0 - 10.5 K/uL 8.5  8.5  8.2   Hemoglobin 13.0 - 17.0 g/dL 88.6  88.0  88.0   Hematocrit 39.0 - 52.0 % 34.7  37.2  36.5   Platelets 150 - 400 K/uL 351  336  343          LOS: 12 days A FACE TO FACE EVALUATION WAS PERFORMED  Joesph JAYSON Likes 07/23/2024, 10:00 AM

## 2024-07-23 NOTE — Progress Notes (Signed)
 Speech Language Pathology Daily Session Note  Patient Details  Name: Kristopher Thompson MRN: [id number] Date of Birth: Oct 07, 1951  Today's Date: 07/23/2024 SLP Individual Time: 0800-0827 SLP Individual Time Calculation (min): 27 min  Short Term Goals: Week 2: SLP Short Term Goal 1 (Week 2): Patient will improve problem solving and planning skills inmoderate complexity tasks when provided min A. SLP Short Term Goal 2 (Week 2): Patient will recall safe swallow strategies (upright for PO, alternate solids/liquids, slow rate, small bites/sips) with 80% acc when provided min A. SLP Short Term Goal 3 (Week 2): Patient will tolerate Dys 3 diet textures with reduced reguritation episodes using safe swallow strategies. SLP Short Term Goal 4 (Week 2): Patient will complete pharyngeal strengthening exercises as appropriate given mod A in order to improve swallow function SLP Short Term Goal 5 (Week 2): Patient will use compensatory memory strategies to recall functional information in the current environment when provided min A.  Skilled Therapeutic Interventions:   Pt and his wife greeted at bedside. He appeared visibly fatigued, but was agreeable to tx tasks targeting dysphagia and cognition. Similar to prev tx session (though even more pronounced this date), he demonstrated minimal attention. He required multiple repetitions of all stimuli and maxA for initiation overall. SLP provided minA cues for orientation and he required modA cues to recall [facility] exercise as introduced in prev tx session. With maxA for attention, initiation, and maintenance of adequate technique; he was able to complete masako x10. Attempted to introduce CTAR w/ ball, though pt unable to complete d/t severity of cognitive deficits this AM. Anticipate fatigue negatively impacts success in the AM, as this was not noted during PM tx sessions last week. SLP to request tx sessions later in the AM or only in the PM as scheduling allows. He  was left in an upright position w/ his breakfast tray set up for consumption. Recommend cont ST per POC.   Pain  No pain reported   Therapy/Group: Individual Therapy  [name] 07/23/2024, 8:40 AM

## 2024-07-23 NOTE — Consult Note (Addendum)
 Reason for Consult: Acute kidney injury on chronic kidney disease stage IIIa Referring Physician: Joesph Likes, DO (PM&R)  HPI:  73 year old man with a complicated past medical history significant for hypertension, history of bladder cancer status post cystectomy with ileal conduit for urinary diversion, urinary obstruction with left-sided nephrostomy tube (right tube capped), congestive heart failure with preserved ejection fraction, bowel perforation status post ileal resection and end ileostomy, right bundle branch block, history of squamous cell carcinoma of the skin and baseline chronic kidney disease stage IIIb with creatinine that appears to have plateaued at between 1.4-1.6.  He was seen earlier during this hospitalization for acute kidney injury that appears to have been secondary to volume contraction/outflow obstruction and improved with intravenous fluids/nephrostomy uncapping.  He had associated severe sepsis that appears to have been from an infected abdominal wall wound and was managed with broad-spectrum antimicrobial coverage (ongoing).  He was thereafter admitted to CIR with significant deconditioning.  He has had decreased oral intake (including oral fluids) for the past few days with increased nausea and a slight drop-off of urine output accompanied by a rise of his creatinine that is now up to 2.6 (up from 2.0 yesterday).  Labs are significant for hyponatremia but it is noted that he was on continuous half-normal saline until discontinued this morning.  He denies any chest pain or shortness of breath and has not had any bloody urine output.  He has not had any iodinated intravenous contrast exposure and is not on any nonsteroidal anti-inflammatory drugs.  He has had some relative hypotension over the past 24 to 48 hours noted without tachycardia and is on midodrine  and fludrocortisone .   Past Medical History:  Diagnosis Date   At risk for sleep apnea    12-25-2017   STOP-BANG SCORE=  5   --- SENT TO PCP   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   moderate atypia - right low back   Atypical nevus 04/04/2007   moderate to marked - right upper back (wider shave)   Atypical nevus 04/04/2007   moderate atypia - center chest (wider shave)   Atypical nevus 04/04/2007   slight atypia - right thigh   Atypical nevus 11/29/2011   mild atypia - center upper back   Atypical nevus 11/29/2011   mild atypia - center chest   Bacteremia due to Klebsiella pneumoniae 10/09/2017   Bladder cancer (HCC) dx 07/2017   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  s/p  cystectomy w/ ileal conduit urinary diversion   Candida infection    CHF (congestive heart failure) (HCC)    Colostomy in place (HCC)    since 08-08-2017-- per pt 12-25-2017 reddness around stoma   Diabetes mellitus without complication (HCC)    GERD (gastroesophageal reflux disease)    H/O hiatal hernia    History of sepsis 09/2017   dx bacteremia due to klebsiella pneumoniae,  post op intraabdominal abscess   Prostate cancer [facility]) urologist-- dr renda   10-02-2012 s/p  prostatectomy-- Stage T1c   RBBB    Renal disorder    pt. denies   Sleep apnea    cpap   Squamous cell carcinoma of skin 05/22/2013   left cheek - CX3 + 5FU   Wears glasses     Past Surgical History:  Procedure Laterality Date   ABDOMINAL SURGERY     APPENDECTOMY  1972   BOWEL RESECTION N/A 04/20/2024   Procedure: SMALL BOWEL RESECTION;  Surgeon: [name], MD;  Location: [facility];  Service: General;  Laterality: N/A;   CHOLECYSTECTOMY  1985   COLONOSCOPY N/A 06/29/2021   Procedure: COLONOSCOPY;  Surgeon: Debby Hila, MD;  [facility];  Service: Endoscopy;  Laterality: N/A;   COLOSTOMY REVERSAL N/A 01/08/2018   Procedure: COLOSTOMY REVERSAL;  Surgeon: Debby Hila, MD;  [facility]: [facility];  Service: General;  Laterality: N/A;   CYSTOSCOPY WITH RETROGRADE PYELOGRAM, URETEROSCOPY AND STENT PLACEMENT Right 06/10/2017   Procedure: CYSTOSCOPY WITH RIGHT  URETEROSCOPY WITH RIGHT STENT PLACEMENT;  Surgeon: [name], MD;  [facility]: [facility];  Service: Urology;  Laterality: Right;   EUS N/A 04/16/2018   Procedure: FULL UPPER ENDOSCOPIC ULTRASOUND (EUS) RADIAL;  Surgeon: Burnette Fallow, MD;  [facility];  Service: Endoscopy;  Laterality: N/A;   FLEXIBLE SIGMOIDOSCOPY N/A 12/13/2017   Procedure: FLEXIBLE SIGMOIDOSCOPY;  Surgeon: Debby Hila, MD;  [facility];  Service: Endoscopy;  Laterality: N/A;   ILEO CONDUIT     IR CATHETER TUBE CHANGE  12/13/2017   IR CATHETER TUBE CHANGE  01/17/2018   IR CATHETER TUBE CHANGE  02/28/2018   IR CATHETER TUBE CHANGE  07/18/2018   IR CATHETER TUBE CHANGE  08/22/2018   IR CATHETER TUBE CHANGE  10/31/2018   IR CONVERT LEFT NEPHROSTOMY TO NEPHROURETERAL CATH  10/24/2017   IR EXT NEPHROURETERAL CATH EXCHANGE  05/19/2018   IR EXT NEPHROURETERAL CATH EXCHANGE  06/16/2018   IR EXT NEPHROURETERAL CATH EXCHANGE  07/09/2024   IR NEPHRO TUBE REMOV/FL  10/24/2017   IR NEPHROSTOGRAM LEFT THRU EXISTING ACCESS  12/05/2018   IR NEPHROSTOMY EXCHANGE LEFT  05/11/2024   IR NEPHROSTOMY EXCHANGE LEFT  07/09/2024   IR NEPHROSTOMY PLACEMENT LEFT  10/07/2017   IR NEPHROSTOMY PLACEMENT RIGHT  05/04/2024   IR NEPHROSTOMY TUBE CHANGE  04/11/2018   IR URETERAL STENT PLACEMENT EXISTING ACCESS LEFT  05/13/2024   IR URETERAL STENT RIGHT NEW ACCESS W/SEP NEPHROSTOMY CATH  05/13/2024   LAPAROTOMY N/A 04/20/2024   Procedure: EXPLORATORY LAPAROTOMY;  Surgeon: [name], MD;  [facility];  Service: General;  Laterality: N/A;   PARTIAL COLECTOMY N/A 04/21/2024   Procedure: COLECTOMY, PARTIAL;  Surgeon: [name], MD;  [facility];  Service: General;  Laterality: N/A;  Removal Wound Vac, Washout Ostomy, Possible Anastomosis, Possible Ileostomy.  Phasix Mesh.   POLYPECTOMY  06/29/2021   Procedure: POLYPECTOMY;  Surgeon: Debby Hila, MD;  [facility];  Service: Endoscopy;;   ROBOT ASSISTED LAPAROSCOPIC RADICAL  PROSTATECTOMY  10/02/2012   Procedure: ROBOTIC ASSISTED LAPAROSCOPIC RADICAL PROSTATECTOMY LEVEL 2;  Surgeon: Noretta Renda, MD;  [facility];  Service: Urology;  Laterality: N/A;   ROBOTIC ASSISTED LAPAROSCOPIC BLADDER DIVERTICULECTOMY N/A 08/08/2017   Procedure: XI ROBOTIC ASSISTED LAPAROSCOPIC RADICAL CYSTECTOMY COVERTED TO OPEN PELVIC LYMPHADNECTOMY BILATERAL AND ILEAL CONDUIT URINARY DIVERSION;  Surgeon: [name], MD;  [facility];  Service: Urology;  Laterality: N/A;   TRANSURETHRAL RESECTION OF BLADDER TUMOR  06/10/2017   Procedure: TRANSURETHRAL RESECTION OF BLADDER TUMOR (TURBT);  Surgeon: [name], MD;  [facility];  Service: Urology;;   VACUUM ASSISTED CLOSURE CHANGE N/A 04/20/2024   Procedure: PLACEMENT OF CONCETTA CAPES;  Surgeon: [name], MD;  [facility];  Service: General;  Laterality: N/A;   VENTRAL HERNIA REPAIR N/A 04/10/2024   Procedure: REPAIR, HERNIA, VENTRAL;  Surgeon: [name], MD;  [facility];  Service: General;  Laterality: N/A;   XI ROBOT ABDOMINAL PERINEAL RESECTION N/A 08/08/2017   Procedure: REPAIR OF RECTAL TEAR POSSIBLE PARTIAL PROCTECTOMY, CREATION OF  OSTOMY;  Surgeon: Debby Hila, MD;  [facility];  Service: General;  Laterality: N/A;   XI ROBOTIC ASSISTED PARASTOMAL HERNIA REPAIR N/A 04/10/2024   Procedure: REPAIR, HERNIA, PARASTOMAL AND VENTRAL HERNIAS, ROBOT-ASSISTED, LEFT INGUINAL HERNIA, LYSIS OF ADHESIONS INCARCERATED AND INSIONAL HERNIAS AND UROSTOMY REVISION;  Surgeon: [name], MD;  [facility];  Service: General;  Laterality: N/A;    Family History  Problem Relation Age of Onset   Lung cancer Mother    Hypertension Father    Colon cancer Other    CAD Neg Hx    Diabetes Neg Hx    Stroke Neg Hx     Social History:  reports that he quit smoking about 44 years ago. His smoking use included cigarettes. He started smoking about 53 years ago. He has never used smokeless tobacco. He reports current  alcohol  use. He reports that he does not use drugs.  Allergies:  Allergies  Allergen Reactions   Demerol  [Meperidine ] Nausea And Vomiting    Medications: I have reviewed the patient's current medications. Scheduled:  apixaban   5 mg Oral BID   childrens multivitamin  1 tablet Oral BID   Chlorhexidine  Gluconate Cloth  6 each Topical Daily   cyanocobalamin   500 mcg Oral Q M,W,F   feeding supplement  1 Container Oral TID WC   feeding supplement  237 mL Oral BID BM   fludrocortisone   0.2 mg Oral Daily   Gerhardt's butt cream  1 Application Topical BID   leptospermum manuka honey  1 Application Topical Daily   loperamide   4 mg Oral TID   megestrol   400 mg Oral BID   melatonin  3 mg Oral QHS   metoprolol  tartrate  12.5 mg Oral BID   midodrine   15 mg Oral TID WC   nutrition supplement (JUVEN)  1 packet Oral BID BM   omega-3 acid ethyl esters  1 g Oral QODAY   ondansetron  (ZOFRAN ) IV  4 mg Intravenous TID AC   polycarbophil  625 mg Oral BID   potassium chloride   20 mEq Oral Daily   rosuvastatin   10 mg Oral QPM   sertraline   100 mg Oral Daily   Continuous:  cefTRIAXone  (ROCEPHIN )  IV 2 g (07/22/24 2348)   linezolid  (ZYVOX ) IV 600 mg (07/23/24 1021)      Latest Ref Rng & Units 07/23/2024    4:30 AM 07/20/2024    5:06 AM 07/19/2024    5:05 AM  BMP  Glucose 70 - 99 mg/dL 93  88  91   BUN 8 - 23 mg/dL 57  42  41   Creatinine 0.61 - 1.24 mg/dL 7.41  8.02  8.12   Sodium 135 - 145 mmol/L 131  135  136   Potassium 3.5 - 5.1 mmol/L 3.7  4.5  3.6   Chloride 98 - 111 mmol/L 105  111  107   CO2 22 - 32 mmol/L 16  17  17    Calcium  8.9 - 10.3 mg/dL 89.9  9.9  9.8       Latest Ref Rng & Units 07/23/2024    4:30 AM 07/22/2024    7:29 AM 07/20/2024    5:06 AM  CBC  WBC 4.0 - 10.5 K/uL 8.5  8.5  8.2   Hemoglobin 13.0 - 17.0 g/dL 88.6  88.0  88.0   Hematocrit 39.0 - 52.0 % 34.7  37.2  36.5   Platelets 150 - 400 K/uL 351  336  343    Urinalysis  Component Value Date/Time   COLORURINE  YELLOW 07/01/2024 2245   APPEARANCEUR TURBID (A) 07/01/2024 2245   LABSPEC 1.012 07/01/2024 2245   PHURINE 6.0 07/01/2024 2245   GLUCOSEU NEGATIVE 07/01/2024 2245   HGBUR LARGE (A) 07/01/2024 2245   BILIRUBINUR NEGATIVE 07/01/2024 2245   KETONESUR NEGATIVE 07/01/2024 2245   PROTEINUR >=300 (A) 07/01/2024 2245   NITRITE NEGATIVE 07/01/2024 2245   LEUKOCYTESUR MODERATE (A) 07/01/2024 2245   No results found.  Review of Systems  Constitutional:  Positive for appetite change and fatigue. Negative for chills and fever.  HENT:  Negative for facial swelling, nosebleeds, sore throat and trouble swallowing.   Eyes:  Negative for pain and visual disturbance.  Respiratory:  Negative for cough, chest tightness and shortness of breath.   Cardiovascular:  Negative for chest pain and leg swelling.  Gastrointestinal:  Negative for abdominal pain, diarrhea, nausea and vomiting.  Endocrine: Negative for polyphagia and polyuria.  Musculoskeletal:  Negative for joint swelling and neck stiffness.  Skin:  Positive for wound. Negative for rash.       Abdominal wall wounds  Neurological:  Positive for weakness. Negative for light-headedness and headaches.   Blood pressure 112/74, pulse 73, temperature 97.6 F (36.4 C), temperature source Oral, resp. rate 19, height 5' 11 (1.803 m), weight 85 kg, SpO2 100%. Physical Exam Vitals and nursing note reviewed.  Constitutional:      Appearance: He is normal weight. He is ill-appearing.  HENT:     Head: Normocephalic and atraumatic.     Right Ear: External ear normal.     Left Ear: External ear normal.     Nose: Nose normal.     Mouth/Throat:     Mouth: Mucous membranes are dry.     Pharynx: Oropharynx is clear.  Eyes:     General: No scleral icterus.    Extraocular Movements: Extraocular movements intact.     Conjunctiva/sclera: Conjunctivae normal.  Cardiovascular:     Rate and Rhythm: Normal rate and regular rhythm.     Pulses: Normal pulses.      Heart sounds: Normal heart sounds.  Pulmonary:     Effort: Pulmonary effort is normal.     Breath sounds: Normal breath sounds. No wheezing or rales.  Abdominal:     General: Abdomen is flat. Bowel sounds are normal. There is no distension.     Tenderness: There is no abdominal tenderness.     Comments: Left nephrostomy draining into bag.  Right lower quadrant ileostomy site with intact bag.  Right lower quadrant ileal conduit.  Intact dressing over periumbilical area midline  Musculoskeletal:     Cervical back: Normal range of motion and neck supple.     Right lower leg: No edema.     Left lower leg: No edema.  Skin:    General: Skin is warm and dry.  Neurological:     Mental Status: He is alert and oriented to person, place, and time. Mental status is at baseline.     Assessment/Plan: 1.  Acute kidney injury on chronic kidney disease stage IIIa: Appears largely hemodynamically mediated with relative hypotension and decreased oral intake.  Agree with supporting blood pressures with midodrine  and will order for additional intravenous fluids to assist with arterial blood volume/renal perfusion.  If renal function does not improve with fluids over the next 24 hours, he will need CT scan of the abdomen and pelvis to verify patency of nephrostomy/ileal conduit drainage.  He does not have any  critical electrolyte abnormalities and appears to be hypovolemic on physical exam. Avoid nephrotoxic medications including NSAIDs and iodinated intravenous contrast exposure unless the latter is absolutely indicated.  Preferred narcotic agents for pain control are hydromorphone , fentanyl , and methadone. Morphine  should not be used. Avoid Baclofen and avoid oral sodium phosphate  and magnesium  citrate based laxatives / bowel preps (I discontinued his fleets enema earlier). Continue strict Input and Output monitoring. Will monitor the patient closely with you and intervene or adjust therapy as indicated by changes  in clinical status/labs. 2.  Hyponatremia: This appears to be euvolemic to hypovolemic hyponatremia possibly exacerbated by administration of hypotonic fluids and ongoing nausea.  Will switch him over to normal saline and evaluate response on labs.  Nausea management per primary service. 3.  Metabolic acidosis: Monitor with volume expansion/correction of acute kidney injury with IV fluids.  Will consider switching from normal saline to lactated Ringer 's if metabolic acidosis worsens. 4.  Sepsis secondary to abdominal wall wound infection: Remains on antibiotic coverage with ceftriaxone  and linezolid . 5.  Critical illness myopathy: Management per physiatry service.  Gordy MARLA Blanch 07/23/2024, 4:29 PM

## 2024-07-23 NOTE — Progress Notes (Signed)
 Physical Therapy Weekly Progress Note  Patient Details  Name: Kristopher Thompson MRN: [id number] Date of Birth: 08-19-1951  Beginning of progress report period: July 12, 2024 End of progress report period: July 23, 2024  Today's Date: 07/23/2024 PT Individual Time: 8961-8897 PT Individual Time Calculation (min): 24 min   Today's Date: 07/23/2024 PT Individual Time: 8698-8641 PT Individual Time Calculation (min): 57 min   Today's Date: 07/23/2024 PT Individual Time: 1501-1530 PT Individual Time Calculation (min): 29 min   Patient has met 1 of 3 short term goals. Pt has made very slow progress toward mobility goals, initiating gait training but not meeting other short term goals due to ongoing maxA or more required for bed mobility and transfers. Pt has very poor initiation and is profoundly deconditioned, requiring up to totalA for bed mobility, maxA to maxA +2 for transfers, and requiring +3 for ambulation with RW up to 50'. Pt will continue to benefit from skilled PT and will benefit from family ed prior to discharge.   Patient continues to demonstrate the following deficits muscle weakness, decreased cardiorespiratoy endurance, decreased initiation, decreased attention, decreased awareness, decreased problem solving, and decreased safety awareness, and decreased sitting balance, decreased standing balance, decreased postural control, and decreased balance strategies and therefore will continue to benefit from skilled PT intervention to increase functional independence with mobility.  Patient progressing toward long term goals..  Continue plan of care.  PT Short Term Goals Week 1:  PT Short Term Goal 1 (Week 1): pt will perform bed mobility with min A with LRAD PT Short Term Goal 2 (Week 1): pt will perform sit to stand with mod A or less PT Short Term Goal 3 (Week 1): pt will initiate gait training Week 2:  PT Short Term Goal 1 (Week 2): Pt will perform bed mobility with modA  consistently. PT Short Term Goal 2 (Week 2): Pt will perform sit to stand with modA consistently. PT Short Term Goal 3 (Week 2): Pt will perform bed to chair with modA consistently. PT Short Term Goal 4 (Week 2): Pt will ambulates x50' with modA +2 and LRAD.  Skilled Therapeutic Interventions/Progress Updates:  Ambulation/gait training;Discharge planning;Functional mobility training;Therapeutic Activities;Balance/vestibular training;Disease management/prevention;Neuromuscular re-education;Skin care/wound management;Therapeutic Exercise;Wheelchair propulsion/positioning;DME/adaptive equipment instruction;Pain management;UE/LE Strength taining/ROM;Community reintegration;Patient/family education;Stair training;UE/LE Coordination activities   1st Session: Pt received seated in WC and agrees to therapy. No complaint of pain. WC transport to gym for time management. Pt completes hamstring soft tissue lengthening with PT providing prolonged stretch at end range knee extension (which is lacking ~15 degrees from neutral). Pt then performs 2x10 LAQs with 4lb ankle weights, and x1 set of 10 count isometric hold at end range extension with PT providing verbal and tactile cues for NM feedback and improved contraction. WC transport back to room. Left seated in WC with all needs within reach.   2nd Session: Pt received seated in [facility] and agrees to therapy. No complaint of pain. WC transport to gym. Pt tasked with performing WC propulsion for mobility training and endurance training. Pt able to propel WC x30' but requires +++ increased time to complete and max cues for body mechanics and propulsion technique. Pt utilizes extremely short arcs of motion resulting in minimal displacement of wheels, and also tends to veer to the Lt, requiring occasional minA to correct. Following, PT has extended discussion with pt regarding goals of care, as well as importance of providing maximal effort during therapy, and increasing caloric  intake to provide pt with  energy. Pt is minimally conversive with therapist but does agree to give best effort going forward. Pt attempts sit to stand several times and is unable to complete due to inadequate anterior weight shifting. Pt eventually able to complete sit to stand with heavy maxA +1 and remains standing ~20 seconds with maxA to prevent posterior LOB, with constant cues to lift head and gaze and extend hips and trunk. Seated rest break. Pt then stands and ambulates x25' with maxA +2, +3 for WC follow, and +4 for IV pole management. Pt has multiple instances in which he buckles through hips and requires totalA to return to upright, with pt verbalizing fear of falling. WC transport back to room. Left seated in WC with all needs within reach.   3rd Session: Pt received seated in [facility] and agrees to therapy. No complaint of pain. WC transport to gym. Pt attempts sit to stand in parallel bars to work on functional strengthening. PT attempts to assist pt with sit to stand and pt is unable to clear buttocks from [facility] on initial attempt, despite heavy maxA from PT. Pt given additional time to initiate movement to allow for more independence, and pt is able to shift hips forward in WC with max cueing and time provided, and pulls trunk forward for several seconds, but then returns to original position with posterior pelvic tilt and trunk reclined in WC. PT enlists +2 assistance and pt is then able to complete sit to stand with maxA +2, then remains standing with modA +1 and max cues for upright gaze and posture. Pt remains standing ~1 minute but never able to shift weight sufficiently over base of support for static standing balance. WC transport back to room. Pt requires maxA +2 for stand step to bed with cues for safety and positioning. Pt requires dependent +2 assist for return to supine. Left semi reclined with all needs within reach.   Therapy Documentation Precautions:  Precautions Precautions: Fall (HOH,  Urostomy, Colostomy, PCN  drain, and orthostatic hypotension.) Recall of Precautions/Restrictions: Impaired Precaution/Restrictions Comments: urostomy, colostomy, lt PCN drain, watch BP and HR Restrictions Weight Bearing Restrictions Per Provider Order: No   Therapy/Group: Individual Therapy  Elsie JAYSON Dawn, PT, DPT 07/23/2024, 4:45 PM

## 2024-07-24 LAB — RENAL FUNCTION PANEL
Albumin: 2.1 g/dL — ABNORMAL LOW (ref 3.5–5.0)
Anion gap: 11 (ref 5–15)
BUN: 53 mg/dL — ABNORMAL HIGH (ref 8–23)
CO2: 14 mmol/L — ABNORMAL LOW (ref 22–32)
Calcium: 9.5 mg/dL (ref 8.9–10.3)
Chloride: 106 mmol/L (ref 98–111)
Creatinine, Ser: 2.21 mg/dL — ABNORMAL HIGH (ref 0.61–1.24)
GFR, Estimated: 31 mL/min — ABNORMAL LOW (ref >60)
Glucose, Bld: 90 mg/dL (ref 70–99)
Phosphorus: 3.7 mg/dL (ref 2.5–4.6)
Potassium: 3.3 mmol/L — ABNORMAL LOW (ref 3.5–5.1)
Sodium: 131 mmol/L — ABNORMAL LOW (ref 135–145)

## 2024-07-24 LAB — BODY FLUID CULTURE W GRAM STAIN

## 2024-07-24 LAB — MISC LABCORP TEST (SEND OUT)
LabCorp test name: 9985
Labcorp test code: 9985

## 2024-07-24 MED ORDER — POTASSIUM CHLORIDE 20 MEQ PO PACK
40.0000 meq | PACK | Freq: Once | ORAL | Status: AC
  Administered 2024-07-24: 40 meq via ORAL
  Filled 2024-07-24: qty 2

## 2024-07-24 MED ORDER — SODIUM CHLORIDE 0.9 % IV SOLN: INTRAVENOUS | Status: DC

## 2024-07-24 MED ORDER — STERILE WATER FOR INJECTION IV SOLN
INTRAVENOUS | Status: DC
  Filled 2024-07-24: qty 1000
  Filled 2024-07-24 (×2): qty 150
  Filled 2024-07-24: qty 1000

## 2024-07-24 MED ORDER — ALBUMIN HUMAN 25 % IV SOLN
25.0000 g | Freq: Four times a day (QID) | INTRAVENOUS | Status: AC
  Administered 2024-07-24 (×2): 25 g via INTRAVENOUS
  Filled 2024-07-24 (×2): qty 100

## 2024-07-24 MED ORDER — POTASSIUM CHLORIDE 20 MEQ PO PACK
40.0000 meq | PACK | Freq: Once | ORAL | Status: AC
Start: 2024-07-24 — End: 2024-07-24
  Administered 2024-07-24: 40 meq via ORAL
  Filled 2024-07-24: qty 2

## 2024-07-24 NOTE — Progress Notes (Signed)
 Physical Therapy Session Note  Patient Details  Name: Kristopher Thompson MRN: [id number] Date of Birth: 23-Feb-1951  Today's Date: 07/24/2024 PT Individual Time: 9195-9140 PT Individual Time Calculation (min): 55 min   Today's Date: 07/24/2024 PT Individual Time: 8583-8555 PT Individual Time Calculation (min): 28 min   Short Term Goals: Week 2:  PT Short Term Goal 1 (Week 2): Pt will perform bed mobility with modA consistently. PT Short Term Goal 2 (Week 2): Pt will perform sit to stand with modA consistently. PT Short Term Goal 3 (Week 2): Pt will perform bed to chair with modA consistently. PT Short Term Goal 4 (Week 2): Pt will ambulates x50' with modA +2 and LRAD.  Skilled Therapeutic Interventions/Progress Updates:     1st Session: Pt received semi reclined in bed and agrees to therapy. Pt noted to have several drains taht need emptying. RN notified and arrives to empty drains. Pt does not complain of pain. Session initially focused on increasing pt's initiation. Pt cued to perform supine to sit and provided with +++ increased time to complete, and PT providing verbal and visual cues. Pt is able to very slowly mobilize legs to edge of bed, but demonstrates very little initiation beyond moving legs. Initially pt requires maxA to completes supine to sit and modA to maintain static sitting balance while shoes are donned. Pt performs stand step transfer from elevated bed to [facility] with maxA +2 and RW, with cues for hand placement and sequencing. WC transport to gym. Pt performs x1 sit to stand to RW with maxA +2 and ambulates x15' with +3 WC follow and constant cues to increase stride length as well as upright posture and gaze to improve balance and safety. WC transport back to room. Left seated with all needs within reach.   2nd Session: Pt received seated in tilt in space WC and agrees to therapy. No complaint of pain. WC transport to gym. Pt performs x2 reps sit to stand with maxA +2 and RW, with  cues for body mechanics, initiation, hand placement, anterior weight shifting, and powering up. In standing, PT cues pt to focus on optimal posture and anterior weight shifting, along with hip and trunk extension. Pt typically reuqires modA +2 for static standing balance due to consistent posterior bias. Pt remains standing ~1 minute each bout. WC transport back to room. Stand step to bed with maxA +2 and RW. TotalA +2 for sit to supine. Pt left with all needs within reach.   Therapy Documentation Precautions:  Precautions Precautions: Fall (HOH, Urostomy, Colostomy, PCN  drain, and orthostatic hypotension.) Recall of Precautions/Restrictions: Impaired Precaution/Restrictions Comments: urostomy, colostomy, lt PCN drain, watch BP and HR Restrictions Weight Bearing Restrictions Per Provider Order: No   Therapy/Group: Individual Therapy  Elsie JAYSON Dawn, PT, DPT 07/24/2024, 3:40 PM

## 2024-07-24 NOTE — Progress Notes (Signed)
 VAST consulted to place midline d/t limited vasculature. Nephrology approved said midline. Upon chart review, noted patient receiving sodium bicarb which should not infuse through a midline or an upper arm IV due to it being a vesicant and increasing the risk for thrombosis of vein as well as potential for severe damage to tissue if extravasation occurs.   Left arm assessed utilizing ultrasound. Appropriate vessel for 22G 2.5 inch catheter visualized and accessed successfully. Educated unit nurse that this IV should be used for sodium bicarb infusion. IV in upper right arm can be used for albumin  and antibiotics.  Update Dr. Emeline on findings of ultrasound and USGIV placement.   Upon reviewing progress notes further, per Emeline, MD Sepsis/Abdominal wall/mesh infection: IV Vanc/Ceftriaxone  for 6 weeks from 08/21  --Probable 6 months total of antibiotics--ID follow up 09/18 @10   --Corynebacterium species-->6 more weeks ?Dalbavancin for OP use, cont ceftriazone and linezolid  for now  per ID.  Advised Dr. Emeline that Vancomycin  can only infuse through midline for up to 6 days and therefore, PICC is recommended.

## 2024-07-24 NOTE — Progress Notes (Signed)
 [facility] NEPHROLOGY PROGRESS NOTE  Assessment/ Plan: Pt is a 73 y.o. 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  # Acute kidney injury on CKD IIIA: Likely hemodynamically mediated with hypotension and decreased oral intake.  Responding with IV fluid with increased urine output and creatinine level trending down.  I am changing fluid with sodium bicarbonate  to help with metabolic acidosis.  Order IV albumin  to help with effective renal perfusion.  Continue strict ins and outs and daily.  # Hyponatremia: Check urine sodium, osmolality.  Continue IV fluid.  # Metabolic acidosis, switching fluid with IV sodium bicarbonate .  Monitor lab.  # Sepsis due to abdominal wall infection on antibiotics per primary team.  # Hypokalemia: Replete potassium chloride .  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  Subjective: Seen and examined bedside.  Working with physical therapy.  Denies nausea, vomiting, chest pain, shortness of breath.  Urine output around 1.3 L.  No new event. Objective Vital signs in last 24 hours: Vitals:   07/23/24 1413 07/23/24 2141 07/24/24 0444 07/24/24 1000  BP: 112/74 113/64 (!) 103/58 103/67  Pulse: 73 68  84  Resp: 19 18 17    Temp: 97.6 F (36.4 C) 98.4 F (36.9 C) 98.2 F (36.8 C)   TempSrc: Oral Oral Oral   SpO2: 100% 99% 99%   Weight:      Height:       Weight change:   Intake/Output Summary (Last 24 hours) at 07/24/2024 1241 Last data filed at 07/24/2024 0840 Gross per 24 hour  Intake 1444.83 ml  Output 1250 ml  Net 194.83 ml       Labs: RENAL PANEL Recent Labs  Lab 07/19/24 0505 07/20/24 0506 07/23/24 0425 07/23/24 0430 07/23/24 1740 07/24/24 0609  NA  136 135  --  131* 131* 131*  K 3.6 4.5  --  3.7 4.0 3.3*  CL 107 111  --  105 104 106  CO2 17* 17*  --  16* 15* 14*  GLUCOSE 91 88  --  93 97 90  BUN 41* 42*  --  57* 57* 53*  CREATININE 1.87* 1.97*  --  2.58* 2.39* 2.21*  CALCIUM  9.8 9.9  --  10.0 10.0 9.5  PHOS  --   --   --   --   --  3.7  ALBUMIN   --   --  2.2*  --   --  2.1*    Liver Function Tests: Recent Labs  Lab 07/23/24 0425 07/24/24 0609  AST 17  --   ALT 13  --   ALKPHOS 81  --   BILITOT 0.4  --   PROT 5.5*  --   ALBUMIN  2.2* 2.1*   No results for input(s): LIPASE, AMYLASE in the last 168 hours. No results for input(s): AMMONIA in the last 168 hours. CBC: Recent Labs    05/02/24 0515 05/04/24 0341 07/13/24 0546 07/16/24 0513 07/20/24 0506 07/22/24 0729 07/23/24 0430  HGB  --    < > 11.5* 11.5* 11.9* 11.9* 11.3*  MCV  --    < > 84.4 82.0 83.5 83.4 82.4  FERRITIN 588*  --   --   --   --   --   --   TIBC 281  --   --   --   --   --   --  IRON  54  --   --   --   --   --   --    < > = values in this interval not displayed.    Cardiac Enzymes: No results for input(s): CKTOTAL, CKMB, CKMBINDEX, TROPONINI in the last 168 hours. CBG: No results for input(s): GLUCAP in the last 168 hours.  Iron  Studies: No results for input(s): IRON , TIBC, TRANSFERRIN, FERRITIN in the last 72 hours. Studies/Results: No results found.  Medications: Infusions:  sodium chloride      cefTRIAXone  (ROCEPHIN )  IV Stopped (07/24/24 9787)   linezolid  (ZYVOX ) IV 600 mg (07/24/24 0942)    Scheduled Medications:  apixaban   5 mg Oral BID   childrens multivitamin  1 tablet Oral BID   Chlorhexidine  Gluconate Cloth  6 each Topical Daily   cyanocobalamin   500 mcg Oral Q M,W,F   feeding supplement  1 Container Oral TID WC   feeding supplement  237 mL Oral BID BM   fludrocortisone   0.2 mg Oral Daily   Gerhardt's butt cream  1 Application Topical BID   leptospermum manuka honey  1 Application Topical Daily    loperamide   4 mg Oral TID   megestrol   400 mg Oral BID   melatonin  3 mg Oral QHS   metoprolol  tartrate  12.5 mg Oral BID   midodrine   15 mg Oral TID WC   nutrition supplement (JUVEN)  1 packet Oral BID BM   omega-3 acid ethyl esters  1 g Oral QODAY   ondansetron  (ZOFRAN ) IV  4 mg Intravenous TID AC   polycarbophil  625 mg Oral BID   potassium chloride   40 mEq Oral Once   potassium chloride   20 mEq Oral Daily   rosuvastatin   10 mg Oral QPM   sertraline   100 mg Oral Daily    have reviewed scheduled and prn medications.  Physical Exam: General:NAD, comfortable Heart:RRR, s1s2 nl Lungs:clear b/l, no crackle Abdomen: Soft, left nephrostomy bag, right lower quadrant ileostomy bag. Extremities:No edema Neurology: Alert , Awake and following commands.  Kristopher Thompson 07/24/2024,12:41 PM  LOS: 13 days

## 2024-07-24 NOTE — Progress Notes (Signed)
 Speech Language Pathology Daily Session Note  Patient Details  Name: Kristopher Thompson MRN: [id number] Date of Birth: 24-Apr-1951  Today's Date: 07/24/2024 SLP Individual Time: 1330-1415 SLP Individual Time Calculation (min): 45 min  Short Term Goals: Week 2: SLP Short Term Goal 1 (Week 2): Patient will improve problem solving and planning skills inmoderate complexity tasks when provided min A. SLP Short Term Goal 2 (Week 2): Patient will recall safe swallow strategies (upright for PO, alternate solids/liquids, slow rate, small bites/sips) with 80% acc when provided min A. SLP Short Term Goal 3 (Week 2): Patient will tolerate Dys 3 diet textures with reduced reguritation episodes using safe swallow strategies. SLP Short Term Goal 4 (Week 2): Patient will complete pharyngeal strengthening exercises as appropriate given mod A in order to improve swallow function SLP Short Term Goal 5 (Week 2): Patient will use compensatory memory strategies to recall functional information in the current environment when provided min A.  Skilled Therapeutic Interventions:   Pt greeted in his room. He was up in his [facility] upon SLP arrival and was agreeable to tx tasks targeting cognition. He demonstrated improved attention and initiation as compared to prev tx sessions, however, required modA overall. He  completed a written category sort task targeting sustained attention and information processing. Additional time required to complete task (~30 mins), however, he required only cue x3 for attention throughout. Pt also required modA for problem solving during phone navigation task. Direct handoff completed w/ PT. Recommend cont ST per POC.     Pain Pain Assessment Pain Scale: 0-10 Pain Score: 0-No pain  Therapy/Group: Individual Therapy  [name] 07/24/2024, 1:45 PM

## 2024-07-24 NOTE — Progress Notes (Signed)
 PROGRESS NOTE   Subjective/Complaints:  No events overnight.  Minimal p.o. intakes recorded, however patient got dinner from outside the hospital and ate 100% of breakfast this morning. BUN and creatinine slowly improving on IV fluids; hyponatremia unchanged 131; hypokalemia 3.3; CO2 14. Vitals are stable.  On review of admission, patient was mod assist to contact-guard for ADLs and mod assist +2 for mobility on admission 8-31.  Is now max assist +2 for all mobility and ADLs.  Therapies note increasingly withdrawn over the last few days.  Only as needed use yesterday was Compazine  x 1  Objective:   No results found.  Recent Labs    07/22/24 0729 07/23/24 0430  WBC 8.5 8.5  HGB 11.9* 11.3*  HCT 37.2* 34.7*  PLT 336 351    Recent Labs    07/23/24 1740 07/24/24 0609  NA 131* 131*  K 4.0 3.3*  CL 104 106  CO2 15* 14*  GLUCOSE 97 90  BUN 57* 53*  CREATININE 2.39* 2.21*  CALCIUM  10.0 9.5    Intake/Output Summary (Last 24 hours) at 07/24/2024 0925 Last data filed at 07/24/2024 0840 Gross per 24 hour  Intake 1564.83 ml  Output 1250 ml  Net 314.83 ml        Physical Exam: Vital Signs Blood pressure (!) 103/58, pulse 68, temperature 98.2 F (36.8 C), temperature source Oral, resp. rate 17, height 5' 11 (1.803 m), weight 85 kg, SpO2 99%.   General: No acute distress.  Sitting up in bedside chair. Mood and affect are appropriate Heart: Regular rate and rhythm no rubs murmurs or extra sounds Lungs: Clear to auscultation, breathing unlabored, no rales or wheezes. Abdomen: Hypoactive bowel sounds, soft nontender to palpation, nondistended. + Liquid brown output from ileostomy + Yellow output with some mild thickness on left nephrostomy tube Extremities: No clubbing, or edema.  Chronic discoloration bilateral ankles indicative of vascular disease.  Brisk capillary refill. Skin: No evidence of breakdown, no  evidence of rash   Skin: abdominal wound continues to close with pink with granulation tissue--operating clean, dry dressing from 9-10.  Neuro:  Alert and oriented x 3.  Somewhat altered insight and awareness, cognitively delayed. Normal language and speech.  Cranial nerve exam unremarkable.  MMT: BUE 4/5 prox to distal. BLE 3-/5 HF, 4-KE and 4/5 ADF/PF--unchanged from prior exam, with some poor effort No limb ataxia or cerebellar signs.  Bilateral upper and lower extremity intention tremors. No abnormal tone appreciated.   Musculoskeletal: both knees remain tight in about 20 degrees flexion contractures--unchanged   Assessment/Plan: 1. Functional deficits which require 3+ hours per day of interdisciplinary therapy in a comprehensive inpatient rehab setting. Physiatrist is providing close team supervision and 24 hour management of active medical problems listed below. Physiatrist and rehab team continue to assess barriers to discharge/monitor patient progress toward functional and medical goals  Care Tool:  Bathing    Body parts bathed by patient: Chest, Face, Abdomen, Right arm, Left arm, Front perineal area, Right upper leg, Left upper leg   Body parts bathed by helper: Buttocks, Right lower leg, Left lower leg     Bathing assist Assist Level: Moderate Assistance - Patient 50 -  74%     Upper Body Dressing/Undressing Upper body dressing   What is the patient wearing?: Hospital gown only    Upper body assist Assist Level: Moderate Assistance - Patient 50 - 74%    Lower Body Dressing/Undressing Lower body dressing      What is the patient wearing?: Pants     Lower body assist Assist for lower body dressing: Maximal Assistance - Patient 25 - 49%     Toileting Toileting    Toileting assist Assist for toileting:  (patient has colosteomy and urostomy)     Transfers Chair/bed transfer  Transfers assist  Chair/bed transfer activity did not occur: Safety/medical  concerns  Chair/bed transfer assist level: Maximal Assistance - Patient 25 - 49%     Locomotion Ambulation   Ambulation assist   Ambulation activity did not occur: Safety/medical concerns (fatigue/dizziness/orthostatics)          Walk 10 feet activity   Assist  Walk 10 feet activity did not occur: Safety/medical concerns (fatigue/dizziness/orthostatics)        Walk 50 feet activity   Assist Walk 50 feet with 2 turns activity did not occur: Safety/medical concerns (fatigue/dizziness/orthostatics)         Walk 150 feet activity   Assist Walk 150 feet activity did not occur: Safety/medical concerns (fatigue/dizziness/orthostatics)         Walk 10 feet on uneven surface  activity   Assist Walk 10 feet on uneven surfaces activity did not occur: Safety/medical concerns (fatigue/dizziness/orthostatics)         Wheelchair     Assist Is the patient using a wheelchair?: Yes Type of Wheelchair: Manual           Wheelchair 50 feet with 2 turns activity    Assist    Wheelchair 50 feet with 2 turns activity did not occur: Safety/medical concerns       Wheelchair 150 feet activity     Assist  Wheelchair 150 feet activity did not occur: Safety/medical concerns       Blood pressure (!) 103/58, pulse 68, temperature 98.2 F (36.8 C), temperature source Oral, resp. rate 17, height 5' 11 (1.803 m), weight 85 kg, SpO2 99%. Medical Problem List and Plan: 1. 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  Undergoing medical workups as below, will monitor improvement for the next couple of days --address at teams if current level of care exceeds ability of home caregivers.  2.  H/o  PE/Antithrombotics: -DVT/anticoagulation:  Pharmaceutical: Eliquis              -antiplatelet therapy: N/A  - No further blood appreciated in nephrostomy tubes  3. Pain Management: Oxycodone  prn.  4. Mood/Behavior/Sleep: LCSW to follow for evaluation and support.              --melatonin for insomnia.              -antipsychotic agents: N/A  - 9-11: Increasingly withdrawn, behavioral changes may be secondary to poor nutritional intake and hyponatremia.  Treating as below, on sertraline  100 mg daily, may need to add additional agent such as mirtazapine but hesitant given ongoing cognitive issues.  5. Neuropsych/cognition: This patient is not capable of making decisions on his own behalf. 6. Skin/Wound Care: Routine pressure relief measures. Encourage adequate diet             --dressing changes daily. Gerhardt's cream to buttock wound.   -abdominal wound continues to close despite tenous nutritional status 7. Fluids/Electrolytes/Nutrition/oropharyngeal dysphagia:                --9/10 periactin  yesterday stopped d/t lethargy   -will try megace  beginning today   -RD f/u   -push fluids   -recheck bmet in am   -continue juven bid. Has been refusing Ensure supplement.   -kdur 20meq daily   -can't use zofran  d/t QTc prolongation (490)  - MBS   9/3--D3/thins, prealbumin borderline normal at 20      Latest Ref Rng & Units 07/24/2024    6:09 AM 07/23/2024    5:40 PM 07/23/2024    4:30 AM  BMP  Glucose 70 - 99 mg/dL 90  97  93   BUN 8 - 23 mg/dL 53  57  57   Creatinine 0.61 - 1.24 mg/dL 7.78  7.60  7.41   Sodium 135 - 145 mmol/L 131  131  131   Potassium 3.5 - 5.1 mmol/L 3.3  4.0  3.7   Chloride 98 - 111 mmol/L 106  104  105   CO2 22 - 32 mmol/L 14  15  16    Calcium  8.9 - 10.3 mg/dL 9.5  89.9  89.9     - 0-88: Nausea remains a primary barrier to adequate p.o. intakes.  Recheck EKG today, QTc was 460, scheduled Zofran  4 mg IV 3 times daily AC.  Will repeat EKG in next 1 to 2 days.     8.  Sepsis/Abdominal wall/mesh infection: IV Vanc/Ceftriaxone  for 6 weeks from 08/21             --Probable 6 months total of antibiotics--ID follow up 09/18 @10  am --Corynebacterium species-->6 more weeks ?Dalbavancin for OP use, cont ceftriazone and linezolid  for now  per ID -appreciate recent ID f/u CRP nl 0.8 ESR 44 WBC 8.2 9-11: WBC labs remained stable.  9. Left> right hydronephrosis: Nephrostomy tubes with stent exchanged on 08/27 --continue clamping right tube and left to drain per urology.  Monitor for blood recheck CBC in am --hemoglobin stable, no further bloody output appreciated  10. Acute on chronic renal failure/hyponatremia:  elevated CR- baseline creat probably ~1.4  -9/10 will give addnl IVF today d/t poor po intake   -recheck bmet in am  - 9-11: AKI significantly worse, now with hyponatremia 131.  Will give 1 L fluid bolus normal saline today, recheck BMP; given brisk uptrend despite increase in IV fluids will consult nephrology for further recommendations.  - 9-12: Appreciate nephrology input and recommendations.  Responding slightly to increased IV fluids overnight, normal saline 125/h.  Hyponatremia remains 131. On multiple medications that could be contributing including Florinef , sertraline , and linezolid .  Will discuss with nephrology today.  - If renal function does not improve with fluids may need CT scan of the abdomen and pelvis to verify patency of nephrostomy/ileal conduit drainage   11. OSA: Encourage CPAP use 12. Hypotension/tachycardia: Continue Midodrine  15 mg TID and Florinef  2 mg daily             --continues to have significant tachycardia with activity likely due to orthostasis and deconditioning.  - -hgb 11.5   -continue metoprolol  low dose 12.5mg  bid,  bp remains soft but stable at present   -  IVF as above Per PT- has been not dropping in therapy  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    9/1 held reglan   - may need to resume if not  tolerating zofran ; output remains grossly liquid  14. Depression: On Zoloft  15. Malnutrition: Albumin  @1 .9/TP-5.1.  --> 2.2 9/11;  - 9-11: Adding Premeal Zofran  as above.  Will get nutrition to start calorie count; may need additional supplementation or discussion of feeding tube if does not improve.  16. Anemia of chronic illness: Stable overall 9/8-11       Latest Ref Rng & Units 07/23/2024    4:30 AM 07/22/2024    7:29 AM 07/20/2024    5:06 AM  CBC  WBC 4.0 - 10.5 K/uL 8.5  8.5  8.2   Hemoglobin 13.0 - 17.0 g/dL 88.6  88.0  88.0   Hematocrit 39.0 - 52.0 % 34.7  37.2  36.5   Platelets 150 - 400 K/uL 351  336  343          LOS: 13 days A FACE TO FACE EVALUATION WAS PERFORMED  Kristopher Thompson Likes 07/24/2024, 9:25 AM

## 2024-07-24 NOTE — Progress Notes (Signed)
 Occupational Therapy Session Note  Patient Details  Name: Kristopher Thompson MRN: [id number] Date of Birth: 10-04-1951  Today's Date: 07/24/2024 OT Individual Time: 1100-1200 OT Individual Time Calculation (min): 60 min    Short Term Goals: Week 2:  OT Short Term Goal 1 (Week 2): Pt will stand from w/c with mod A OT Short Term Goal 2 (Week 2): Pt will complete LB dressing with mod A OT Short Term Goal 3 (Week 2): Pt will complete ADL routine with no more than 2 rest breaks OT Short Term Goal 4 (Week 2): Pt will complete stand pivot transfer to w/c with mod A using LRAD Week 3:     Skilled Therapeutic Interventions/Progress Updates:    1:1 Pt received in the w/c and reported and say he wanted to work on standing. Pt worked on propelling w/c ~ 15 feet with max A to bring hands back to produce a big push. Pt fatigues quickly. Came into dayroom and used the standing frame to come up into standing with support for tolerance and working on achieving an upright posture. Performed 3 stands for ~ 1.5 to 2 min each with aim to reach up and take Squiz off the window in front on it - 5 in a stand with max encouragement. Pt with HR elevated up 120s.   Pt's w/c also traded out for a tilt in space for increased pressure relief and for encouragement of head extension. And adjusted air in [facility] for optimal seating. Returned to room and left sitting up.   Therapy Documentation Precautions:  Precautions Precautions: Fall (HOH, Urostomy, Colostomy, PCN  drain, and orthostatic hypotension.) Recall of Precautions/Restrictions: Impaired Precaution/Restrictions Comments: urostomy, colostomy, lt PCN drain, watch BP and HR Restrictions Weight Bearing Restrictions Per Provider Order: No  Pain:  No reports of pain just fatigue     Therapy/Group: Individual Therapy  Claudene Nest [facility] 07/24/2024, 4:08 PM

## 2024-07-25 LAB — CBC WITH DIFFERENTIAL/PLATELET
Abs Immature Granulocytes: 0.06 K/uL (ref 0.00–0.07)
Basophils Absolute: 0.1 K/uL (ref 0.0–0.1)
Basophils Relative: 1 %
Eosinophils Absolute: 0.6 K/uL — ABNORMAL HIGH (ref 0.0–0.5)
Eosinophils Relative: 9 %
HCT: 32.7 % — ABNORMAL LOW (ref 39.0–52.0)
Hemoglobin: 10.9 g/dL — ABNORMAL LOW (ref 13.0–17.0)
Immature Granulocytes: 1 %
Lymphocytes Relative: 19 %
Lymphs Abs: 1.3 K/uL (ref 0.7–4.0)
MCH: 26.7 pg (ref 26.0–34.0)
MCHC: 33.3 g/dL (ref 30.0–36.0)
MCV: 80.1 fL (ref 80.0–100.0)
Monocytes Absolute: 0.8 K/uL (ref 0.1–1.0)
Monocytes Relative: 12 %
Neutro Abs: 4 K/uL (ref 1.7–7.7)
Neutrophils Relative %: 58 %
Platelets: 285 K/uL (ref 150–400)
RBC: 4.08 MIL/uL — ABNORMAL LOW (ref 4.22–5.81)
RDW: 14.8 % (ref 11.5–15.5)
WBC: 6.9 K/uL (ref 4.0–10.5)
nRBC: 0 % (ref 0.0–0.2)

## 2024-07-25 LAB — HEPATIC FUNCTION PANEL
ALT: 13 U/L (ref 0–44)
AST: 15 U/L (ref 15–41)
Albumin: 2.8 g/dL — ABNORMAL LOW (ref 3.5–5.0)
Alkaline Phosphatase: 73 U/L (ref 38–126)
Bilirubin, Direct: <0.1 mg/dL (ref 0.0–0.2)
Total Bilirubin: 0.4 mg/dL (ref 0.0–1.2)
Total Protein: 5.5 g/dL — ABNORMAL LOW (ref 6.5–8.1)

## 2024-07-25 LAB — RENAL FUNCTION PANEL
Albumin: 2.8 g/dL — ABNORMAL LOW (ref 3.5–5.0)
Anion gap: 13 (ref 5–15)
BUN: 53 mg/dL — ABNORMAL HIGH (ref 8–23)
CO2: 19 mmol/L — ABNORMAL LOW (ref 22–32)
Calcium: 9.9 mg/dL (ref 8.9–10.3)
Chloride: 102 mmol/L (ref 98–111)
Creatinine, Ser: 2.22 mg/dL — ABNORMAL HIGH (ref 0.61–1.24)
GFR, Estimated: 31 mL/min — ABNORMAL LOW (ref >60)
Glucose, Bld: 88 mg/dL (ref 70–99)
Phosphorus: 3.4 mg/dL (ref 2.5–4.6)
Potassium: 3.3 mmol/L — ABNORMAL LOW (ref 3.5–5.1)
Sodium: 134 mmol/L — ABNORMAL LOW (ref 135–145)

## 2024-07-25 LAB — AMMONIA: Ammonia: 21 umol/L (ref 9–35)

## 2024-07-25 MED ORDER — MIDODRINE HCL 5 MG PO TABS
20.0000 mg | ORAL_TABLET | Freq: Three times a day (TID) | ORAL | Status: DC
Start: 2024-07-25 — End: 2024-08-04
  Administered 2024-07-25 – 2024-08-04 (×29): 20 mg via ORAL
  Filled 2024-07-25 (×29): qty 4

## 2024-07-25 MED ORDER — POTASSIUM CHLORIDE 20 MEQ PO PACK
40.0000 meq | PACK | Freq: Once | ORAL | Status: AC
Start: 2024-07-25 — End: 2024-07-25
  Administered 2024-07-25: 40 meq via ORAL
  Filled 2024-07-25: qty 2

## 2024-07-25 NOTE — Progress Notes (Signed)
 PROGRESS NOTE   Subjective/Complaints:  No events overnight.  With AM therapies orthostatic 82/49 EOB  86/53 supine.  Patient remains encephalopathic, slow to respond, but otherwise stable.  Other vitals look good, no fevers, tachycardia, or desats.   Urinary output picked up considerably yesterday, remains on IV bicarb drip and received IV albumin  yesterday.  Labs this a.m. with improving hyponatremia, ongoing hypokalemia, stable BUN/creatinine, improving CO2.  Output from ileostomy, urostomy's appear appropriate.  P.o. intakes remain very poor, ate 30% of 1 meal yesterday and no other intakes.  Discussed with patient and his wife at bedside possibility of need for feeding tube if p.o. intakes do not pick up; patient outright refuses this, stating he has had it in the past and does not want it again.  Wishes to trial p.o.'s on his own for few days first.   Objective:   No results found.  Recent Labs    07/23/24 0430  WBC 8.5  HGB 11.3*  HCT 34.7*  PLT 351    Recent Labs    07/24/24 0609 07/25/24 0609  NA 131* 134*  K 3.3* 3.3*  CL 106 102  CO2 14* 19*  GLUCOSE 90 88  BUN 53* 53*  CREATININE 2.21* 2.22*  CALCIUM  9.5 9.9    Intake/Output Summary (Last 24 hours) at 07/25/2024 1135 Last data filed at 07/25/2024 0956 Gross per 24 hour  Intake 4664.36 ml  Output 2750 ml  Net 1914.36 ml        Physical Exam: Vital Signs Blood pressure (!) 98/59, pulse 75, temperature 97.8 F (36.6 C), resp. rate 19, height 5' 11 (1.803 m), weight 90 kg, SpO2 100%.   General: No acute distress.  Laying in bed. Mood and affect are appropriate Heart: Regular rate and rhythm no rubs murmurs or extra sounds Lungs: Clear to auscultation, breathing unlabored, no rales or wheezes. Abdomen: Hypoactive bowel sounds, soft nontender to palpation, nondistended. + Liquid brown output from ileostomy + Yellow output with some mild  thickness on left nephrostomy tube + Yellow, thin drainage from right prior nephrostomy site, stable from prior exams.  Extremities: No clubbing, or edema.  Chronic discoloration bilateral ankles indicative of vascular disease.  Brisk capillary refill.   Skin: abdominal wound continues to close with pink with granulation tissue--opening clean, dry dressing 9-13  Neuro:  Alert and oriented x 3.  Somewhat altered insight and awareness, cognitively delayed.  Attention and recall poor. Normal language and speech.  Cranial nerve exam unremarkable.  MMT: BUE 4/5 prox to distal. BLE 3-/5 HF, 4-KE and 4/5 ADF/PF--unchanged from prior exam, with some poor effort No limb ataxia or cerebellar signs.  Bilateral upper and lower extremity intention tremors. No abnormal tone appreciated.   Musculoskeletal: Able to straighten both knees out on bed.  Plantarflexion tone bilateral lower extremities, barely able to range to neutral.   Assessment/Plan: 1. Functional deficits which require 3+ hours per day of interdisciplinary therapy in a comprehensive inpatient rehab setting. Physiatrist is providing close team supervision and 24 hour management of active medical problems listed below. Physiatrist and rehab team continue to assess barriers to discharge/monitor patient progress toward functional and medical goals  Care  Tool:  Bathing    Body parts bathed by patient: Chest, Face, Abdomen, Right arm, Left arm, Front perineal area, Right upper leg, Left upper leg   Body parts bathed by helper: Buttocks, Right lower leg, Left lower leg     Bathing assist Assist Level: Moderate Assistance - Patient 50 - 74%     Upper Body Dressing/Undressing Upper body dressing   What is the patient wearing?: Hospital gown only    Upper body assist Assist Level: Moderate Assistance - Patient 50 - 74%    Lower Body Dressing/Undressing Lower body dressing      What is the patient wearing?: Pants     Lower body  assist Assist for lower body dressing: Maximal Assistance - Patient 25 - 49%     Toileting Toileting    Toileting assist Assist for toileting:  (patient has colosteomy and urostomy)     Transfers Chair/bed transfer  Transfers assist  Chair/bed transfer activity did not occur: Safety/medical concerns  Chair/bed transfer assist level: Maximal Assistance - Patient 25 - 49%     Locomotion Ambulation   Ambulation assist   Ambulation activity did not occur: Safety/medical concerns (fatigue/dizziness/orthostatics)          Walk 10 feet activity   Assist  Walk 10 feet activity did not occur: Safety/medical concerns (fatigue/dizziness/orthostatics)        Walk 50 feet activity   Assist Walk 50 feet with 2 turns activity did not occur: Safety/medical concerns (fatigue/dizziness/orthostatics)         Walk 150 feet activity   Assist Walk 150 feet activity did not occur: Safety/medical concerns (fatigue/dizziness/orthostatics)         Walk 10 feet on uneven surface  activity   Assist Walk 10 feet on uneven surfaces activity did not occur: Safety/medical concerns (fatigue/dizziness/orthostatics)         Wheelchair     Assist Is the patient using a wheelchair?: Yes Type of Wheelchair: Manual           Wheelchair 50 feet with 2 turns activity    Assist    Wheelchair 50 feet with 2 turns activity did not occur: Safety/medical concerns       Wheelchair 150 feet activity     Assist  Wheelchair 150 feet activity did not occur: Safety/medical concerns       Blood pressure (!) 98/59, pulse 75, temperature 97.8 F (36.6 C), resp. rate 19, height 5' 11 (1.803 m), weight 90 kg, SpO2 100%. Medical Problem List and Plan: 1. 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  Undergoing medical workups as below, will monitor improvement for the next couple of days --address at teams if current level of care exceeds ability of home caregivers.   - 9-13: Bilateral lower extremity PRAFO's in bed for plantarflexion/tight heel cords  2.  H/o PE/Antithrombotics: -DVT/anticoagulation:  Pharmaceutical: Eliquis              -antiplatelet therapy: N/A  - No further blood appreciated in nephrostomy tubes  3. Pain Management: Oxycodone  prn.  4. Mood/Behavior/Sleep: LCSW to follow for evaluation and support.              --  melatonin for insomnia.              -antipsychotic agents: N/A  - 9-11: Increasingly withdrawn, behavioral changes may be secondary to poor nutritional intake and hyponatremia.  Treating as below, on sertraline  100 mg daily, may need to add additional agent such as mirtazapine but hesitant given ongoing cognitive issues.  9-12: Slightly better today, but consistent with hypoactive delirium with primary difficulty with concentration, no focal neurologic deficits.  Start overnight sleep log, delirium precautions.  Suspect hyponatremia as primary contributor, management as below.  9/13: D3C Flexeril , Megace , Zoloft  due to ongoing cognitive changes.  Sodium is improving.  Get LFTs, ammonia today to evaluate ongoing cognitive deficits--within normal limits.  5. Neuropsych/cognition: This patient is not capable of making decisions on his own behalf. 6. Skin/Wound Care: Routine pressure relief measures. Encourage adequate diet             --dressing changes daily. Gerhardt's cream to buttock wound.   -abdominal wound continues to close despite tenous nutritional status 7. Fluids/Electrolytes/Nutrition/oropharyngeal dysphagia/moderate to severe protein calorie malnutrition:                --9/10 periactin  yesterday stopped d/t lethargy   -will try megace  beginning  today   -RD f/u   -push fluids   -recheck bmet in am   -continue juven bid. Has been refusing Ensure supplement.   -kdur 20meq daily   -can't use zofran  d/t QTc prolongation (490)  - MBS   9/3--D3/thins, prealbumin borderline normal at 20   - 9-11: Nausea remains a primary barrier to adequate p.o. intakes.  Recheck EKG today, QTc was 460, scheduled Zofran  4 mg IV 3 times daily AC.  Will repeat EKG in next 1 to 2 days.  - 9-12: Albumin  infusion per nephrology.  P.o. intakes improving with Zofran .  per IV team midline contraindicated due to bicarb.  May get PICC line prior to discharge.     8. Sepsis/Abdominal wall/mesh infection: IV Vanc/Ceftriaxone  for 6 weeks from 08/21             --Probable 6 months total of antibiotics--ID follow up 09/18 @10  am --Corynebacterium species-->6 more weeks ?Dalbavancin for OP use, cont ceftriazone and linezolid  for now  per ID -appreciate recent ID f/u CRP nl 0.8 ESR 44 WBC 8.2 9-11: WBC labs remained stable.  9. Left> right hydronephrosis: Nephrostomy tubes with stent exchanged on 08/27 --continue clamping right tube and left to drain per urology.  Monitor for blood recheck CBC in am --hemoglobin stable, no further bloody output appreciated  10. Acute on chronic renal failure/hyponatremia:  elevated CR- baseline creat probably ~1.4  -9/10 will give addnl IVF today d/t poor po intake   -recheck bmet in am  - 9-11: AKI significantly worse, now with hyponatremia 131.  Will give 1 L fluid bolus normal saline today, recheck BMP; given brisk uptrend despite increase in IV fluids will consult nephrology for further recommendations.  - 9-12: Appreciate nephrology input and recommendations.  Responding slightly to increased IV fluids overnight, normal saline 125/h.  Hyponatremia remains 131. On multiple medications that could be contributing including Florinef , sertraline , and linezolid .  See their note for specific recommendations; advised to remain on fluids  for primary treatment of hyponatremia, do not recommend salt tabs at this time.  - If renal function does not improve with fluids may need CT scan of the abdomen and pelvis to verify patency of nephrostomy/ileal conduit drainage   9-13: Potassium chloride  packet  40 mill equivalents ordered this a.m. for repletion.  Sodium improving, bicarb improving, but renal function remains unchanged on bicarb drip.   11. OSA: Encourage CPAP use 12. Hypotension/tachycardia: Continue Midodrine  15 mg TID and Florinef  2 mg daily             --continues to have significant tachycardia with activity likely due to orthostasis and deconditioning.  - -hgb 11.5   -continue metoprolol  low dose 12.5mg  bid, bp remains soft but stable at present   -  IVF as above Per PT- has been not dropping in therapy   9-13: Orthostatic today, DC Lopressor  and increase midodrine  to 20 mg 3 times daily.  Has been getting a significant amount of IV fluid last few days, still likely volume depleted per nephrology.  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    9/1 held reglan   - may need to resume if not tolerating zofran ; output remains grossly liquid  14. Depression: On Zoloft  15. Malnutrition: Albumin  @1 .9/TP-5.1.  --> 2.2 9/11;  - 9-11: Adding Premeal Zofran  as above.  Will get nutrition to start calorie count; may need additional supplementation or discussion of feeding tube if does not improve.  - 9-12: Slightly improved, continue this regimen.  9-13: Very poor p.o. intakes yesterday.  DC Megace  due to possible cognitive effects, no perceived benefit.  Will discuss possible feeding tube with family--patient rejects, will get calorie count for 48 hours and if no improvement may readdress tube feed versus palliative consult  16. Anemia of chronic illness: Stable overall 9/8-11       Latest Ref Rng & Units 07/23/2024    4:30 AM 07/22/2024    7:29 AM 07/20/2024    5:06 AM  CBC  WBC 4.0 - 10.5 K/uL 8.5  8.5  8.2    Hemoglobin 13.0 - 17.0 g/dL 88.6  88.0  88.0   Hematocrit 39.0 - 52.0 % 34.7  37.2  36.5   Platelets 150 - 400 K/uL 351  336  343          LOS: 14 days A FACE TO FACE EVALUATION WAS PERFORMED  Kristopher Thompson 07/25/2024, 11:35 AM

## 2024-07-25 NOTE — Progress Notes (Signed)
 [facility] NEPHROLOGY PROGRESS NOTE  Assessment/ Plan: Pt is a 73 y.o. 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  # Acute kidney injury on CKD IIIA: Likely hemodynamically mediated with hypotension and decreased oral intake.  Responding with IV fluid with increased urine output and creatinine level trending down.  Continue IV fluid with sodium bicarbonate , he has increased urine output and creatinine level is stable.  Received IV albumin  as well.  Continue strict ins and outs and daily.  # Hyponatremia: Sodium level improving with fluid.    # Metabolic acidosis, improving with sodium bicarbonate .  Monitor lab.  # Sepsis due to abdominal wall infection on antibiotics per primary team.  # Hypokalemia: Continue to replete potassium chloride .  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  Subjective: Seen and examined bedside.  No new event.  Urine output is around 2.4 L.  Denies nausea, vomiting, chest pain or shortness of breath.  His wife was present at the bedside. Objective Vital signs in last 24 hours: Vitals:   07/24/24 1538 07/24/24 2041 07/25/24 0500 07/25/24 0517  BP: 109/76 132/77  (!) 98/59  Pulse: 72 79  75  Resp: 18 17  19   Temp: (!) 97.4 F (36.3 C) 97.8 F (36.6 C)  97.8 F (36.6 C)  TempSrc:  Oral    SpO2: 100% 99%  100%  Weight:   90 kg   Height:       Weight change:   Intake/Output Summary (Last 24 hours) at 07/25/2024 1141 Last data filed at 07/25/2024 0956 Gross per 24 hour  Intake 4664.36 ml  Output 2750 ml  Net 1914.36 ml       Labs: RENAL PANEL Recent Labs  Lab 07/20/24 0506 07/23/24 0425 07/23/24 0430 07/23/24 1740 07/24/24 0609 07/25/24 0609  NA 135   --  131* 131* 131* 134*  K 4.5  --  3.7 4.0 3.3* 3.3*  CL 111  --  105 104 106 102  CO2 17*  --  16* 15* 14* 19*  GLUCOSE 88  --  93 97 90 88  BUN 42*  --  57* 57* 53* 53*  CREATININE 1.97*  --  2.58* 2.39* 2.21* 2.22*  CALCIUM  9.9  --  10.0 10.0 9.5 9.9  PHOS  --   --   --   --  3.7 3.4  ALBUMIN   --  2.2*  --   --  2.1* 2.8*    Liver Function Tests: Recent Labs  Lab 07/23/24 0425 07/24/24 0609 07/25/24 0609  AST 17  --   --   ALT 13  --   --   ALKPHOS 81  --   --   BILITOT 0.4  --   --   PROT 5.5*  --   --   ALBUMIN  2.2* 2.1* 2.8*   No results for input(s): LIPASE, AMYLASE in the last 168 hours. No results for input(s): AMMONIA in the last 168 hours. CBC: Recent Labs    05/02/24 0515 05/04/24 0341 07/13/24 0546 07/16/24 0513 07/20/24 0506 07/22/24 0729 07/23/24 0430  HGB  --    < > 11.5* 11.5* 11.9* 11.9* 11.3*  MCV  --    < > 84.4 82.0 83.5 83.4 82.4  FERRITIN 588*  --   --   --   --   --   --  TIBC 281  --   --   --   --   --   --   IRON  54  --   --   --   --   --   --    < > = values in this interval not displayed.    Cardiac Enzymes: No results for input(s): CKTOTAL, CKMB, CKMBINDEX, TROPONINI in the last 168 hours. CBG: No results for input(s): GLUCAP in the last 168 hours.  Iron  Studies: No results for input(s): IRON , TIBC, TRANSFERRIN, FERRITIN in the last 72 hours. Studies/Results: No results found.  Medications: Infusions:  cefTRIAXone  (ROCEPHIN )  IV Stopped (07/25/24 0104)   linezolid  (ZYVOX ) IV 600 mg (07/25/24 0942)   sodium bicarbonate  150 mEq in sterile water  1,150 mL infusion 100 mL/hr at 07/25/24 0736    Scheduled Medications:  apixaban   5 mg Oral BID   childrens multivitamin  1 tablet Oral BID   Chlorhexidine  Gluconate Cloth  6 each Topical Daily   cyanocobalamin   500 mcg Oral Q M,W,F   feeding supplement  1 Container Oral TID WC   feeding supplement  237 mL Oral BID BM   fludrocortisone   0.2 mg Oral Daily    Gerhardt's butt cream  1 Application Topical BID   leptospermum manuka honey  1 Application Topical Daily   loperamide   4 mg Oral TID   megestrol   400 mg Oral BID   melatonin  3 mg Oral QHS   midodrine   15 mg Oral TID WC   nutrition supplement (JUVEN)  1 packet Oral BID BM   omega-3 acid ethyl esters  1 g Oral QODAY   ondansetron  (ZOFRAN ) IV  4 mg Intravenous TID AC   polycarbophil  625 mg Oral BID   potassium chloride   20 mEq Oral Daily   rosuvastatin   10 mg Oral QPM   sertraline   100 mg Oral Daily    have reviewed scheduled and prn medications.  Physical Exam: General:NAD, comfortable Heart:RRR, s1s2 nl Lungs:clear b/l, no crackle Abdomen: Soft, left nephrostomy bag, right lower quadrant ileostomy bag. Extremities:No edema Neurology: Alert , Awake and following commands.  Kristopher Thompson 07/25/2024,11:41 AM  LOS: 14 days

## 2024-07-25 NOTE — Progress Notes (Signed)
 Orthopedic Tech Progress Note Patient Details:  Kristopher Thompson [birth year] [id number] Applied Prafo boots bilaterally per order.  Ortho Devices Type of Ortho Device: Prafo boot/shoe Ortho Device/Splint Location: BLE Ortho Device/Splint Interventions: Ordered, Application, Adjustment   Post Interventions Patient Tolerated: Well Instructions Provided: Adjustment of device, Care of device, Poper ambulation with device  Morna Pink 07/25/2024, 1:19 PM

## 2024-07-25 NOTE — Progress Notes (Signed)
 Occupational Therapy Session Note  Patient Details  Name: Kristopher Thompson MRN: [id number] Date of Birth: [birth year]  Today's Date: 07/25/2024 OT Individual Time: 1050-1150 OT Individual Time Calculation (min): 60 min  and Today's Date: 07/25/2024 OT Missed Time: 15 Minutes Missed Time Reason: Patient fatigue   Short Term Goals: Week 2:  OT Short Term Goal 1 (Week 2): Pt will stand from w/c with mod A OT Short Term Goal 2 (Week 2): Pt will complete LB dressing with mod A OT Short Term Goal 3 (Week 2): Pt will complete ADL routine with no more than 2 rest breaks OT Short Term Goal 4 (Week 2): Pt will complete stand pivot transfer to w/c with mod A using LRAD  Skilled Therapeutic Interventions/Progress Updates:    Pt received supine with no c/o pain, agreeable to OT session.  He was very slow to respond today and required extra time and cueing for processing commands- inconsistent one step direction following. He was oriented to self, place, but not time. He worked on bridging in the bed to don pants, requiring max A to position and hold BLE. OT donned thigh high teds with total A, pt unable to hold either leg up for more than a brief second. He required max cueing and assist to roll R for total A LB dressing. He came to EOB with increased time allowed for processing and max A overall. Pt with very little effort. Once EOB he had difficulty maintaining static sitting balance and required max A to maintain midline orientation and not lean onto RUE. Once EOB his BP was assessed, it was 92/56. After 1 min it dropped further to 82/49, HR 79 bpm. He was transferred back to supine with max A +2. After 3 min supine it was 86/53, and then 1 min later 80/51. MD notified. Put bed in trendelenberg and BP rose to 91/51. He reports I feel fine. He was instructed in gentle BUE strengthening with a 3 lb dowel in shoulder flexion 1x10 repetitions but was too tired to continue. BP following 94/57. He was left  supine with all needs met. 15 min missed d/t pt fatigue.   Therapy Documentation Precautions:  Precautions Precautions: Fall (HOH, Urostomy, Colostomy, PCN  drain, and orthostatic hypotension.) Recall of Precautions/Restrictions: Impaired Precaution/Restrictions Comments: urostomy, colostomy, lt PCN drain, watch BP and HR Restrictions Weight Bearing Restrictions Per Provider Order: No  Therapy/Group: Individual Therapy  [name] 07/25/2024, 7:53 AM

## 2024-07-25 NOTE — Plan of Care (Signed)
  Problem: Consults Goal: RH GENERAL PATIENT EDUCATION Description: See Patient Education module for education specifics. Outcome: Progressing   Problem: RH BOWEL ELIMINATION Goal: RH STG MANAGE BOWEL WITH ASSISTANCE Description: STG Manage Bowel with supervision Assistance. Outcome: Progressing   Problem: RH BLADDER ELIMINATION Goal: RH STG MANAGE BLADDER WITH ASSISTANCE Description: STG Manage Bladder With supervision Assistance Outcome: Progressing   Problem: RH SKIN INTEGRITY Goal: RH STG SKIN FREE OF INFECTION/BREAKDOWN Description: Manage skin free of infection with supervision assistance Outcome: Progressing   Problem: RH SAFETY Goal: RH STG ADHERE TO SAFETY PRECAUTIONS W/ASSISTANCE/DEVICE Description: STG Adhere to Safety Precautions With Assistance/Device. Outcome: Progressing   Problem: RH PAIN MANAGEMENT Goal: RH STG PAIN MANAGED AT OR BELOW PT'S PAIN GOAL Description: <4 w/ prns Outcome: Progressing   Problem: RH KNOWLEDGE DEFICIT GENERAL Goal: RH STG INCREASE KNOWLEDGE OF SELF CARE AFTER HOSPITALIZATION Description: Manage increase knowledge of self care care after hospitalization with supervision assistance from wife using educational materials provided Outcome: Progressing

## 2024-07-25 NOTE — Progress Notes (Signed)
 Speech Language Pathology Daily Session Note  Patient Details  Name: Kristopher Thompson MRN: [id number] Date of Birth: 27-Jul-1951  Today's Date: 07/25/2024 SLP Individual Time: 8599-8571 SLP Individual Time Calculation (min): 28 min  Short Term Goals: Week 2: SLP Short Term Goal 1 (Week 2): Patient will improve problem solving and planning skills inmoderate complexity tasks when provided min A. SLP Short Term Goal 2 (Week 2): Patient will recall safe swallow strategies (upright for PO, alternate solids/liquids, slow rate, small bites/sips) with 80% acc when provided min A. SLP Short Term Goal 3 (Week 2): Patient will tolerate Dys 3 diet textures with reduced reguritation episodes using safe swallow strategies. SLP Short Term Goal 4 (Week 2): Patient will complete pharyngeal strengthening exercises as appropriate given mod A in order to improve swallow function SLP Short Term Goal 5 (Week 2): Patient will use compensatory memory strategies to recall functional information in the current environment when provided min A.  Skilled Therapeutic Interventions: Pt seen for skilled ST with focus on swallowing and cognitive goals, pt in bed with wife present throughout. Pt remains lethargic with very delayed responses, eyes open ~50% of time. Pt consuming 100% yogurt for lunch meal and ~3oz nutrition shake via straw without s/s aspiration, overall delayed oral and pharyngeal phase. Nursing providing med pass, pt benefits from extra time and single pills with thin liquid wash via straw to clear. No noted difficulty. SLP facilitating simple recall task with focus on functional information by providing mod A cues for ~50% accuracy. Pt benefits from rest breaks, extra time and repetition of prompts to attempt to increase motivation and participation. Pt eventually closing eyes and session ended early d/t fatigue. Discussed patient progress with wife and recommendations to continue to promote PO intake via small  meals/snacks throughout the day, verbalized understanding. Cont ST POC.   Pain Pain Assessment Pain Scale: 0-10 Pain Score: 0-No pain  Therapy/Group: Individual Therapy  Andriette JONELLE Rattler 07/25/2024, 2:21 PM

## 2024-07-26 ENCOUNTER — Other Ambulatory Visit: Payer: Self-pay

## 2024-07-26 LAB — RENAL FUNCTION PANEL
Albumin: 2.5 g/dL — ABNORMAL LOW (ref 3.5–5.0)
Anion gap: 11 (ref 5–15)
BUN: 49 mg/dL — ABNORMAL HIGH (ref 8–23)
CO2: 26 mmol/L (ref 22–32)
Calcium: 9.4 mg/dL (ref 8.9–10.3)
Chloride: 97 mmol/L — ABNORMAL LOW (ref 98–111)
Creatinine, Ser: 2.14 mg/dL — ABNORMAL HIGH (ref 0.61–1.24)
GFR, Estimated: 32 mL/min — ABNORMAL LOW (ref >60)
Glucose, Bld: 82 mg/dL (ref 70–99)
Phosphorus: 2.7 mg/dL (ref 2.5–4.6)
Potassium: 3 mmol/L — ABNORMAL LOW (ref 3.5–5.1)
Sodium: 134 mmol/L — ABNORMAL LOW (ref 135–145)

## 2024-07-26 MED ORDER — SODIUM CHLORIDE 0.9% FLUSH
10.0000 mL | Freq: Two times a day (BID) | INTRAVENOUS | Status: DC
  Administered 2024-07-26 – 2024-08-14 (×24): 10 mL

## 2024-07-26 MED ORDER — SODIUM CHLORIDE 0.9 % IV SOLN
INTRAVENOUS | Status: DC
Start: 2024-07-26 — End: 2024-07-27

## 2024-07-26 MED ORDER — POTASSIUM CHLORIDE 20 MEQ PO PACK
40.0000 meq | PACK | Freq: Once | ORAL | Status: AC
  Administered 2024-07-26: 40 meq via ORAL
  Filled 2024-07-26: qty 2

## 2024-07-26 MED ORDER — SODIUM CHLORIDE 0.9% FLUSH: 10.0000 mL | INTRAVENOUS | Status: DC | PRN

## 2024-07-26 NOTE — Plan of Care (Signed)
  Problem: RH BOWEL ELIMINATION Goal: RH STG MANAGE BOWEL WITH ASSISTANCE Description: STG Manage Bowel with supervision Assistance. Outcome: Progressing   Problem: RH BLADDER ELIMINATION Goal: RH STG MANAGE BLADDER WITH ASSISTANCE Description: STG Manage Bladder With supervision Assistance Outcome: Progressing   Problem: RH SKIN INTEGRITY Goal: RH STG SKIN FREE OF INFECTION/BREAKDOWN Description: Manage skin free of infection/Breakdown with supervision assistance Outcome: Progressing

## 2024-07-26 NOTE — Progress Notes (Signed)
 Peripherally Inserted Central Catheter Placement  The IV Nurse has discussed with the patient and/or persons authorized to consent for the patient, the purpose of this procedure and the potential benefits and risks involved with this procedure.  The benefits include less needle sticks, lab draws from the catheter, and the patient may be discharged home with the catheter. Risks include, but not limited to, infection, bleeding, blood clot (thrombus formation), and puncture of an artery; nerve damage and irregular heartbeat and possibility to perform a PICC exchange if needed/ordered by physician.  Alternatives to this procedure were also discussed.  Bard Power PICC patient education guide, fact sheet on infection prevention and patient information card has been provided to patient /or left at bedside.  Wife at bedside and signed consent per pt request.  PICC Placement Documentation  PICC Single Lumen 07/26/24 Right Basilic 43 cm 0 cm (Active)  Indication for Insertion or Continuance of Line Limited venous access - need for IV therapy >5 days (PICC only);Chronic illness with exacerbations (CF, Sickle Cell, etc.) 07/26/24 2000  Exposed Catheter (cm) 0 cm 07/26/24 2000  Site Assessment Clean, Dry, Intact 07/26/24 2000  Line Status Flushed;Saline locked;Blood return noted 07/26/24 2000  Dressing Type Transparent;Securing device 07/26/24 2000  Dressing Status Antimicrobial disc/dressing in place 07/26/24 2000  Line Care Connections checked and tightened 07/26/24 2000  Line Adjustment (NICU/IV Team Only) No 07/26/24 2000  Dressing Intervention New dressing;Adhesive placed at insertion site (IV team only) 07/26/24 2000  Dressing Change Due 08/02/24 07/26/24 2000       [name]  [name] 07/26/2024, 8:35 PM

## 2024-07-26 NOTE — Progress Notes (Signed)
 Secure chat with Dr Dolan states okay to place PICC in either arm.

## 2024-07-26 NOTE — Progress Notes (Signed)
 [facility] NEPHROLOGY PROGRESS NOTE  Assessment/ Plan: Pt is a 73 y.o. 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  # Acute kidney injury on CKD IIIA: Likely hemodynamically mediated with hypotension and decreased oral intake.  Responding with IV fluid with increased urine output and creatinine level trending down.  CO2 level improved therefore I will discontinue sodium bicarbonate .  The family and primary team is concerned about very poor oral intake therefore needing maintenance IV fluid.  Switch to NS at 75 cc an hour.  Continue to encourage oral intake and hydration.    # Hyponatremia: Sodium level improving with fluid.    # Metabolic acidosis, improved with sodium bicarbonate .  Monitor lab.  # Sepsis due to abdominal wall infection on antibiotics per primary team.  # Hypokalemia: Continue to replete potassium chloride .  Check magnesium  level.  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  Discussed with the primary team provider and patient's wife  Subjective: Seen and examined bedside.  The urine output is around 1.1 L documented.  Oral intake is poor.  Denies nausea, vomiting, chest pain or shortness of breath.  His wife presented at the bedside with him. Objective Vital signs in last 24 hours: Vitals:   07/25/24 0517 07/25/24 1514 07/25/24 1900 07/26/24 0422  BP: (!) 98/59 104/60 100/60 (!) 94/57  Pulse: 75 66 74 70  Resp: 19 17 18 18   Temp: 97.8 F (36.6 C) 98.2 F (36.8 C) 97.9 F (36.6 C) 98.3 F (36.8 C)  TempSrc:   Oral   SpO2: 100% 99%  100%  Weight:      Height:       Weight change:   Intake/Output Summary (Last 24 hours) at 07/26/2024 1048 Last data filed at 07/26/2024  1013 Gross per 24 hour  Intake 1099.03 ml  Output 1100 ml  Net -0.97 ml       Labs: RENAL PANEL Recent Labs  Lab 07/23/24 0425 07/23/24 0430 07/23/24 1740 07/24/24 0609 07/25/24 0609 07/25/24 1318 07/26/24 0529  NA  --  131* 131* 131* 134*  --  134*  K  --  3.7 4.0 3.3* 3.3*  --  3.0*  CL  --  105 104 106 102  --  97*  CO2  --  16* 15* 14* 19*  --  26  GLUCOSE  --  93 97 90 88  --  82  BUN  --  57* 57* 53* 53*  --  49*  CREATININE  --  2.58* 2.39* 2.21* 2.22*  --  2.14*  CALCIUM   --  10.0 10.0 9.5 9.9  --  9.4  PHOS  --   --   --  3.7 3.4  --  2.7  ALBUMIN  2.2*  --   --  2.1* 2.8* 2.8* 2.5*    Liver Function Tests: Recent Labs  Lab 07/23/24 0425 07/24/24 0609 07/25/24 0609 07/25/24 1318 07/26/24 0529  AST 17  --   --  15  --   ALT 13  --   --  13  --   ALKPHOS 81  --   --  73  --   BILITOT 0.4  --   --  0.4  --   PROT 5.5*  --   --  5.5*  --   ALBUMIN  2.2*   < > 2.8* 2.8* 2.5*   < > = values in this interval not displayed.   No results for input(s): LIPASE, AMYLASE in the last 168 hours. Recent Labs  Lab 07/25/24 1318  AMMONIA 21   CBC: Recent Labs    05/02/24 0515 05/04/24 0341 07/16/24 0513 07/20/24 0506 07/22/24 0729 07/23/24 0430 07/25/24 1318  HGB  --    < > 11.5* 11.9* 11.9* 11.3* 10.9*  MCV  --    < > 82.0 83.5 83.4 82.4 80.1  FERRITIN 588*  --   --   --   --   --   --   TIBC 281  --   --   --   --   --   --   IRON  54  --   --   --   --   --   --    < > = values in this interval not displayed.    Cardiac Enzymes: No results for input(s): CKTOTAL, CKMB, CKMBINDEX, TROPONINI in the last 168 hours. CBG: No results for input(s): GLUCAP in the last 168 hours.  Iron  Studies: No results for input(s): IRON , TIBC, TRANSFERRIN, FERRITIN in the last 72 hours. Studies/Results: No results found.  Medications: Infusions:  cefTRIAXone  (ROCEPHIN )  IV 2 g (07/26/24 0034)   linezolid  (ZYVOX ) IV 600 mg (07/26/24 1013)    sodium bicarbonate  150 mEq in sterile water  1,150 mL infusion 100 mL/hr at 07/26/24 0416    Scheduled Medications:  apixaban   5 mg Oral BID   childrens multivitamin  1 tablet Oral BID   Chlorhexidine  Gluconate Cloth  6 each Topical Daily   cyanocobalamin   500 mcg Oral Q M,W,F   feeding supplement  1 Container Oral TID WC   feeding supplement  237 mL Oral BID BM   fludrocortisone   0.2 mg Oral Daily   Gerhardt's butt cream  1 Application Topical BID   leptospermum manuka honey  1 Application Topical Daily   loperamide   4 mg Oral TID   melatonin  3 mg Oral QHS   midodrine   20 mg Oral TID WC   nutrition supplement (JUVEN)  1 packet Oral BID BM   omega-3 acid ethyl esters  1 g Oral QODAY   ondansetron  (ZOFRAN ) IV  4 mg Intravenous TID AC   polycarbophil  625 mg Oral BID   potassium chloride   20 mEq Oral Daily   rosuvastatin   10 mg Oral QPM    have reviewed scheduled and prn medications.  Physical Exam: General:NAD, comfortable Heart:RRR, s1s2 nl Lungs:clear b/l, no crackle Abdomen: Soft, left nephrostomy bag, right lower quadrant ileostomy bag. Extremities:No edema Neurology: Alert , Awake and following commands.  [name] [name] 07/26/2024,10:48 AM  LOS: 15 days

## 2024-07-26 NOTE — Progress Notes (Signed)
 PROGRESS NOTE   Subjective/Complaints:  No events overnight.  Slightly more alert and responsive today compared to prior days.  Denies any pain, states he is feeling better.  Per wife, still intermittently confused.  Vitals looking about the same from yesterday, remains hypotensive in the 90s to 100s. P.o. intakes about the same, 30% of breakfast yesterday, drinking boost breeze and intermittently refusing ensures.  Wife says he ate all of his Cheerios for breakfast. Remains on IV bicarb, BUN/creatinine continues to improve and CO2 has normalized today.  Mildly hyponatremic 134.  Potassium 3.0.   ROS: Positives per HPI above. Denies fevers, chills, N/V, abdominal pain, SOB, chest pain, new weakness or paraesthesias.    Objective:   No results found.  Recent Labs    07/25/24 1318  WBC 6.9  HGB 10.9*  HCT 32.7*  PLT 285    Recent Labs    07/25/24 0609 07/26/24 0529  NA 134* 134*  K 3.3* 3.0*  CL 102 97*  CO2 19* 26  GLUCOSE 88 82  BUN 53* 49*  CREATININE 2.22* 2.14*  CALCIUM  9.9 9.4    Intake/Output Summary (Last 24 hours) at 07/26/2024 1022 Last data filed at 07/26/2024 0416 Gross per 24 hour  Intake 899.03 ml  Output 1100 ml  Net -200.97 ml        Physical Exam: Vital Signs Blood pressure (!) 94/57, pulse 70, temperature 98.3 F (36.8 C), resp. rate 18, height 5' 11 (1.803 m), weight 90 kg, SpO2 100%.   General: No acute distress.  Laying in bed. Mood and affect are appropriate; slightly withdrawn Heart: Regular rate and rhythm no rubs murmurs or extra sounds Lungs: Clear to auscultation, breathing unlabored, no rales or wheezes. Abdomen: Hypoactive bowel sounds, soft nontender to palpation, nondistended. + Liquid brown output from ileostomy--slightly green-tinged, bilious + Yellow output with some mild thickness on left nephrostomy tube--ongoing + Yellow, thin drainage from right prior  nephrostomy site--reduced 9-14, no longer expressible.  Extremities: No clubbing, or edema.  Chronic discoloration bilateral ankles indicative of vascular disease.  Brisk capillary refill.   Skin: abdominal wound continues to close with pink with granulation tissue--opening clean, dry dressing 9-13  Neuro:  Alert and oriented to person, place.  [name], month and year with cues, oriented to president and situation. Somewhat altered insight and awareness, cognitively delayed.   Attention and recall poor.--Slightly improved 9-14; can now recognize deficits but cannot answer questions correctly  Normal language and speech.  Cranial nerve exam unremarkable.  MMT: BUE 4/5 prox to distal. BLE 3-/5 HF, 4-KE and 4/5 ADF/PF--improved effort 9-14 No limb ataxia or cerebellar signs.  Bilateral upper and lower extremity intention tremors--ongoing. No abnormal tone appreciated.   Musculoskeletal: Able to straighten both knees out on bed.  Plantarflexion tone bilateral lower extremities, barely able to range to neutral.   Assessment/Plan: 1. Functional deficits which require 3+ hours per day of interdisciplinary therapy in a comprehensive inpatient rehab setting. Physiatrist is providing close team supervision and 24 hour management of active medical problems listed below. Physiatrist and rehab team continue to assess barriers to discharge/monitor patient progress toward functional and medical goals  Care  Tool:  Bathing    Body parts bathed by patient: Chest, Face, Abdomen, Right arm, Left arm, Front perineal area, Right upper leg, Left upper leg   Body parts bathed by helper: Buttocks, Right lower leg, Left lower leg     Bathing assist Assist Level: Moderate Assistance - Patient 50 - 74%     Upper Body Dressing/Undressing Upper body dressing   What is the patient wearing?: Hospital gown only    Upper body assist Assist Level: Moderate Assistance - Patient 50 - 74%    Lower  Body Dressing/Undressing Lower body dressing      What is the patient wearing?: Pants     Lower body assist Assist for lower body dressing: Maximal Assistance - Patient 25 - 49%     Toileting Toileting    Toileting assist Assist for toileting:  (patient has colosteomy and urostomy)     Transfers Chair/bed transfer  Transfers assist  Chair/bed transfer activity did not occur: Safety/medical concerns  Chair/bed transfer assist level: Maximal Assistance - Patient 25 - 49%     Locomotion Ambulation   Ambulation assist   Ambulation activity did not occur: Safety/medical concerns (fatigue/dizziness/orthostatics)          Walk 10 feet activity   Assist  Walk 10 feet activity did not occur: Safety/medical concerns (fatigue/dizziness/orthostatics)        Walk 50 feet activity   Assist Walk 50 feet with 2 turns activity did not occur: Safety/medical concerns (fatigue/dizziness/orthostatics)         Walk 150 feet activity   Assist Walk 150 feet activity did not occur: Safety/medical concerns (fatigue/dizziness/orthostatics)         Walk 10 feet on uneven surface  activity   Assist Walk 10 feet on uneven surfaces activity did not occur: Safety/medical concerns (fatigue/dizziness/orthostatics)         Wheelchair     Assist Is the patient using a wheelchair?: Yes Type of Wheelchair: Manual           Wheelchair 50 feet with 2 turns activity    Assist    Wheelchair 50 feet with 2 turns activity did not occur: Safety/medical concerns       Wheelchair 150 feet activity     Assist  Wheelchair 150 feet activity did not occur: Safety/medical concerns       Blood pressure (!) 94/57, pulse 70, temperature 98.3 F (36.8 C), resp. rate 18, height 5' 11 (1.803 m), weight 90 kg, SpO2 100%. Medical Problem List and Plan: 1. Functional deficits secondary to probable critical illness myopathy due to sepsis             -patient may  not shower             -ELOS/Goals: 08/07/24 sup/minA goals  -Continue CIR therapies including PT, OT, and SLP    - 9-11: Therapies concerned regarding increasingly withdrawn, weak, and poor participation.  Undergoing medical workups as below, will monitor improvement for the next couple of days --address at teams if current level of care exceeds ability of home caregivers.   - 9-13: Bilateral lower extremity PRAFO's in bed for plantarflexion/tight heel cords  2.  H/o PE/Antithrombotics: -DVT/anticoagulation:  Pharmaceutical: Eliquis              -antiplatelet therapy: N/A  - No further blood appreciated in nephrostomy tubes  3. Pain Management: Oxycodone  prn.  4. Mood/Behavior/Sleep: LCSW to follow for evaluation and support.              --  melatonin for insomnia.              -antipsychotic agents: N/A  9-14: Per patient, sleeping well.  Sleep log.  Discussed delirium management with patient's wife  5. Neuropsych/cognition/encephalopathy secondary to metabolic acidosis: This patient is not capable of making decisions on his own behalf.  - 9-11: Increasingly withdrawn, behavioral changes may be secondary to poor nutritional intake and hyponatremia.  Treating as below, on sertraline  100 mg daily, may need to add additional agent such as mirtazapine but hesitant given ongoing cognitive issues.  9-12: Slightly better today, but consistent with hypoactive delirium with primary difficulty with concentration, no focal neurologic deficits.  Start overnight sleep log, delirium precautions.  Suspect hyponatremia as primary contributor, management as below.  9/13: D3C Flexeril , Megace , Zoloft  due to ongoing cognitive changes.  Sodium is improving.  Get LFTs, ammonia today to evaluate ongoing cognitive deficits--within normal limits.  -14: Cognition seems to be clearing a bit, continue current regimen.  6. Skin/Wound Care: Routine pressure relief measures. Encourage adequate diet             --dressing  changes daily. Gerhardt's cream to buttock wound.   -abdominal wound continues to close despite tenous nutritional status 7. Fluids/Electrolytes/Nutrition/oropharyngeal dysphagia/moderate to severe protein calorie malnutrition:                --9/10 periactin  yesterday stopped d/t lethargy   -will try megace  beginning today   -RD f/u   -push fluids   -recheck bmet in am   -continue juven bid. Has been refusing Ensure supplement.   -kdur 20meq daily   -can't use zofran  d/t QTc prolongation (490)  - MBS   9/3--D3/thins, prealbumin borderline normal at 20   - 9-11: Nausea remains a primary barrier to adequate p.o. intakes.  Recheck EKG today, QTc was 460, scheduled Zofran  4 mg IV 3 times daily AC.  Will repeat EKG in next 1 to 2 days.  - 9-12: Albumin  infusion per nephrology.  P.o. intakes improving with Zofran .  per IV team midline contraindicated due to bicarb.  May get PICC line once dialysis no longer considered.  9-14: P.o. intakes okay, Zofran  continues to help with nausea.  Per nephrology, okay to reduce fluids, will keep on 70 cc/h normal saline for maintenance for now.  PICC line order placed.     8. Sepsis/Abdominal wall/mesh infection: IV Vanc/Ceftriaxone  for 6 weeks from 08/21             --Probable 6 months total of antibiotics--ID follow up 09/18 @10  am --Corynebacterium species-->6 more weeks ?Dalbavancin for OP use, cont ceftriazone and linezolid  for now  per ID -appreciate recent ID f/u CRP nl 0.8 ESR 44 WBC 8.2 9-11: WBC labs remained stable.  9. Left> right hydronephrosis: Nephrostomy tubes with stent exchanged on 08/27 --continue clamping right tube and left to drain per urology.  Monitor for blood recheck CBC in am --hemoglobin stable, no further bloody output appreciated  10. Acute on chronic renal failure/hyponatremia:  elevated CR- baseline creat probably ~1.4  -9/10 will give addnl IVF today d/t poor po intake   -recheck bmet in am  - 9-11: AKI significantly  worse, now with hyponatremia 131.  Will give 1 L fluid bolus normal saline today, recheck BMP; given brisk uptrend despite increase in IV fluids will consult nephrology for further recommendations.  - 9-12: Appreciate nephrology input and recommendations.  Responding slightly to increased IV fluids overnight, normal saline 125/h.  Hyponatremia remains 131.  On multiple medications that could be contributing including Florinef , sertraline , and linezolid .  See their note for specific recommendations; advised to remain on fluids for primary treatment of hyponatremia, do not recommend salt tabs at this time.  - If renal function does not improve with fluids may need CT scan of the abdomen and pelvis to verify patency of nephrostomy/ileal conduit drainage   9-13: Potassium chloride  packet 40 mill equivalents ordered this a.m. for repletion.  Sodium improving, bicarb improving, but renal function remains unchanged on bicarb drip.  9-14: Renal function improved, acidosis resolved, sodium 134.  Per nephrology, can DC fluids, but agreeable to maintenance fluids given ongoing poor p.o. intakes.  Continue to monitor closely.   11. OSA: Encourage CPAP use 12. Hypotension/tachycardia: Continue Midodrine  15 mg TID and Florinef  2 mg daily             --continues to have significant tachycardia with activity likely due to orthostasis and deconditioning.  - -hgb 11.5   -continue metoprolol  low dose 12.5mg  bid, bp remains soft but stable at present   -  IVF as above Per PT- has been not dropping in therapy   9-13: Orthostatic today, DC Lopressor  and increase midodrine  to 20 mg 3 times daily.  Has been getting a significant amount of IV fluid last few days, still likely volume depleted per nephrology.  9-14: Blood pressure remains stable, but soft.  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    9/1 held reglan   - may need to resume if not tolerating zofran ; output remains grossly liquid  14. Depression:  On Zoloft  15. Malnutrition: Albumin  @1 .9/TP-5.1.  --> 2.2 9/11;  - 9-11: Adding Premeal Zofran  as above.  Will get nutrition to start calorie count; may need additional supplementation or discussion of feeding tube if does not improve.  - 9-12: Slightly improved, continue this regimen.  9-13: Very poor p.o. intakes yesterday.  DC Megace  due to possible cognitive effects, no perceived benefit.  Will discuss possible feeding tube with family--patient rejects, will get calorie count for 48 hours and if no improvement may readdress tube feed versus palliative consult  9-14: Wife encouraging p.o.'s, patient motivated to avoid feeding tube.  Calorie count started.  Maintenance fluids as above.  16. Anemia of chronic illness: Stable overall 9/8-11  - Hemoglobin stable 9-13       Latest Ref Rng & Units 07/25/2024    1:18 PM 07/23/2024    4:30 AM 07/22/2024    7:29 AM  CBC  WBC 4.0 - 10.5 K/uL 6.9  8.5  8.5   Hemoglobin 13.0 - 17.0 g/dL 89.0  88.6  88.0   Hematocrit 39.0 - 52.0 % 32.7  34.7  37.2   Platelets 150 - 400 K/uL 285  351  336          LOS: 15 days A FACE TO FACE EVALUATION WAS PERFORMED  Kristopher Thompson Likes 07/26/2024, 10:22 AM

## 2024-07-26 NOTE — Progress Notes (Signed)
 Nutrition Follow Up  DOCUMENTATION CODES:  Non-severe (moderate) malnutrition in context of chronic illness (bladder cancer, tachycardia, hx bowel perforation)  INTERVENTION:  Diet recommendations per SLP assessment Currently on dysphagia 3 w/ thins  Ensure Plus High Protein po BID or Fairlife shakes from home  Boost Breeze po TID, each supplement provides 250 kcal and 9 grams of protein   Juven BID to support wound healing  MVI with minerals daily  Monitor ostomy output  48-hour kcal count initiated 9/14  NUTRITION DIAGNOSIS:  Moderate Malnutrition related to chronic illness (bladder cancer, tachycardia,  hx bowel perforation) as evidenced by moderate fat depletion, moderate muscle depletion, percent weight loss. Remains applicable  GOAL:  Patient will meet greater than or equal to 90% of their needs Progressing  MONITOR:  PO intake, Supplement acceptance, Weight trends  REASON FOR ASSESSMENT:  Consult Assessment of nutrition requirement/status, Calorie Count, Poor PO  ASSESSMENT:  Pt admitted to CIR with debility following most recent admission for fatigue, mental status changes, tachycardia, hypotension and purulent drainage from abdominal wall drain. PMH significant for HFpEF, CAD, T2DM, PE on eliquis , prostate cancer, bladder cancer s/p cystoprostatectomy and ileal conduit (2013), recent admission 04/10/24 for parastomal-incisional abdominal wall hernia repair w/graft and ileal conduit reversal c/b intra-abdominal and abdominal wall abscess from bowel perforation s/p end ileostomy.  Recent Admissions: 5/30-7/11: bowel perforation s/p SBR/end ileostomy 7/14-7/16: AKI, infection of ileostomy site 7/16-8/8: Admitted to CIR 8/20-8/30: Admitted to [facility]: AKI, severe sepsis  Current Admission: 8/30 admitted to CIR 9/3 MBS, recommended DYS 3 w/ thins  RD working remotely.  New consult received for initiation of kcal count. Pt being followed by RD team with last full  assessment on 8/8. Familiar with pt from prior admissions.  Unfortunately, pt with continues very poor PO intake. Reviewed all recorded meals since last assessment and of the 13 recorded, 7 were refused completely. Also noted that pt consistent refusing ensure supplements but is accepting of boost breeze. MD spoke frankly with pt and wife about his poor intake and possible need for enteral nutrition support. Pt refused this intervention.   Significant weight loss of 9% noted over the last 3 months since initial surgery at [facility] which is severe and concerning given timeframe.  Will start kcal count today and collect meal tickets to be able to provide quantitative data about intake to care team and pt. GOC may need to be addressed if pt is severely under-consuming his estimated needs but continues to decline aggressive nutrition interventions such as enteral support.   Will follow-up tomorrow for first day of kcal count.  Average Meal Completion: 8/30-9/2: 43% average intake x 6 recorded meals 9/1-9/5: 31% average intake x 8 recorded meals 9/8-9/14 23% intake x 13 recorded meals  Medications:  Scheduled Meds:  childrens multivitamin  1 tablet Oral BID   cyanocobalamin   500 mcg Oral Q M,W,F   Boost Breeze  1 Container Oral TID WC   Ensure Plus High Protein  237 mL Oral BID BM   loperamide   4 mg Oral TID   JUVEN  1 packet Oral BID BM   omega-3 acid ethyl esters  1 g Oral QODAY   ondansetron  (ZOFRAN )   4 mg Intravenous TID AC   polycarbophil  625 mg Oral BID   potassium chloride   20 mEq Oral Daily   rosuvastatin   10 mg Oral QPM   Continuous Infusions:  sodium chloride  75 mL/hr at 07/26/24 1258   cefTRIAXone  (ROCEPHIN )  IV 2  g (07/26/24 0034)   linezolid  (ZYVOX ) IV 600 mg (07/26/24 1013)   PRN Meds: alum & mag hydroxide-simeth, bisacodyl , diphenhydrAMINE , prochlorperazine   Labs:  Sodium 134, chloride 97 Potassium 3.0 BUN 49, creatinine 2.14 GFR 32  Ileostomy: 1200 mL x  recorded yesterday Urostomy:  750 mL x 24 hours Nephrostomy (left): out x 24 hours   Diet Order:   Diet Order             DIET DYS 3 Room service appropriate? Yes; Fluid consistency: Thin  Diet effective now                   EDUCATION NEEDS:  Education needs have been addressed  Skin: Reviewed RN Assessment MASD to the right buttocks (2.5 x 6 cm) Healing midline incision from surgery 7/22  Last BM:  1.2L recorded via ileostomy 9/13  Height:  Ht Readings from Last 1 Encounters:  07/12/24 5' 11 (1.803 m)    Weight:  Wt Readings from Last 1 Encounters:  07/25/24 90 kg   BMI:  Body mass index is 27.67 kg/m.  Estimated Nutritional Needs:   Kcal:  2200-2400  Protein:  120-135g  Fluid:  >/=2L   Kristopher Thompson, RD, LDN, CNSC Registered Dietitian II Please reach out via secure chat

## 2024-07-27 ENCOUNTER — Other Ambulatory Visit (HOSPITAL_COMMUNITY): Payer: Self-pay

## 2024-07-27 ENCOUNTER — Telehealth (HOSPITAL_COMMUNITY): Payer: Self-pay | Admitting: Pharmacy Technician

## 2024-07-27 ENCOUNTER — Inpatient Hospital Stay (HOSPITAL_COMMUNITY)

## 2024-07-27 LAB — CBC
HCT: 32.9 % — ABNORMAL LOW (ref 39.0–52.0)
Hemoglobin: 10.6 g/dL — ABNORMAL LOW (ref 13.0–17.0)
MCH: 26.6 pg (ref 26.0–34.0)
MCHC: 32.2 g/dL (ref 30.0–36.0)
MCV: 82.7 fL (ref 80.0–100.0)
Platelets: 215 K/uL (ref 150–400)
RBC: 3.98 MIL/uL — ABNORMAL LOW (ref 4.22–5.81)
RDW: 14.8 % (ref 11.5–15.5)
WBC: 7.1 K/uL (ref 4.0–10.5)
nRBC: 0 % (ref 0.0–0.2)

## 2024-07-27 LAB — BASIC METABOLIC PANEL WITH GFR
Anion gap: 10 (ref 5–15)
BUN: 44 mg/dL — ABNORMAL HIGH (ref 8–23)
CO2: 29 mmol/L (ref 22–32)
Calcium: 8.8 mg/dL — ABNORMAL LOW (ref 8.9–10.3)
Chloride: 94 mmol/L — ABNORMAL LOW (ref 98–111)
Creatinine, Ser: 1.98 mg/dL — ABNORMAL HIGH (ref 0.61–1.24)
GFR, Estimated: 35 mL/min — ABNORMAL LOW (ref >60)
Glucose, Bld: 92 mg/dL (ref 70–99)
Potassium: 3.2 mmol/L — ABNORMAL LOW (ref 3.5–5.1)
Sodium: 133 mmol/L — ABNORMAL LOW (ref 135–145)

## 2024-07-27 LAB — RENAL FUNCTION PANEL
Albumin: 2.2 g/dL — ABNORMAL LOW (ref 3.5–5.0)
Anion gap: 12 (ref 5–15)
BUN: 43 mg/dL — ABNORMAL HIGH (ref 8–23)
CO2: 27 mmol/L (ref 22–32)
Calcium: 8.8 mg/dL — ABNORMAL LOW (ref 8.9–10.3)
Chloride: 97 mmol/L — ABNORMAL LOW (ref 98–111)
Creatinine, Ser: 1.98 mg/dL — ABNORMAL HIGH (ref 0.61–1.24)
GFR, Estimated: 35 mL/min — ABNORMAL LOW (ref >60)
Glucose, Bld: 91 mg/dL (ref 70–99)
Phosphorus: 2.4 mg/dL — ABNORMAL LOW (ref 2.5–4.6)
Potassium: 3.3 mmol/L — ABNORMAL LOW (ref 3.5–5.1)
Sodium: 136 mmol/L (ref 135–145)

## 2024-07-27 LAB — GLUCOSE, CAPILLARY: Glucose-Capillary: 103 mg/dL — ABNORMAL HIGH (ref 70–99)

## 2024-07-27 LAB — C-REACTIVE PROTEIN: CRP: 5.2 mg/dL — ABNORMAL HIGH (ref <1.0)

## 2024-07-27 LAB — MAGNESIUM: Magnesium: 1.3 mg/dL — ABNORMAL LOW (ref 1.7–2.4)

## 2024-07-27 LAB — SEDIMENTATION RATE: Sed Rate: 31 mm/h — ABNORMAL HIGH (ref 0–16)

## 2024-07-27 MED ORDER — MAGNESIUM GLUCONATE 500 (27 MG) MG PO TABS
500.0000 mg | ORAL_TABLET | Freq: Every day | ORAL | Status: DC
  Administered 2024-07-27 – 2024-08-13 (×18): 500 mg via ORAL
  Filled 2024-07-27 (×18): qty 1

## 2024-07-27 MED ORDER — SODIUM CHLORIDE 0.9 % IV BOLUS
500.0000 mL | Freq: Once | INTRAVENOUS | Status: AC
  Administered 2024-07-27: 500 mL via INTRAVENOUS

## 2024-07-27 MED ORDER — POTASSIUM CHLORIDE IN NACL 40-0.9 MEQ/L-% IV SOLN
INTRAVENOUS | Status: DC
  Filled 2024-07-27: qty 1000

## 2024-07-27 MED ORDER — CHLORHEXIDINE GLUCONATE CLOTH 2 % EX PADS
6.0000 | MEDICATED_PAD | Freq: Two times a day (BID) | CUTANEOUS | Status: DC
  Administered 2024-07-27 – 2024-08-14 (×34): 6 via TOPICAL

## 2024-07-27 MED ORDER — POTASSIUM CHLORIDE CRYS ER 20 MEQ PO TBCR: 20.0000 meq | EXTENDED_RELEASE_TABLET | Freq: Two times a day (BID) | ORAL | Status: DC

## 2024-07-27 MED ORDER — SODIUM CHLORIDE 0.9 % IV SOLN: INTRAVENOUS | Status: AC

## 2024-07-27 MED ORDER — POTASSIUM CHLORIDE CRYS ER 20 MEQ PO TBCR
20.0000 meq | EXTENDED_RELEASE_TABLET | Freq: Every day | ORAL | Status: DC
  Administered 2024-07-28 – 2024-07-31 (×4): 20 meq via ORAL
  Filled 2024-07-27 (×4): qty 1

## 2024-07-27 MED ORDER — MAGNESIUM SULFATE 2 GM/50ML IV SOLN
2.0000 g | Freq: Once | INTRAVENOUS | Status: AC
  Administered 2024-07-27: 2 g via INTRAVENOUS
  Filled 2024-07-27: qty 50

## 2024-07-27 NOTE — Progress Notes (Signed)
 Speech Language Pathology Daily Session Note  Patient Details  Name: Kristopher Thompson MRN: [id number] Date of Birth: 1951/02/10  Today's Date: 07/27/2024 SLP Individual Time: 0900-1000 SLP Individual Time Calculation (min): 60 min  Short Term Goals: Week 2: SLP Short Term Goal 1 (Week 2): Patient will improve problem solving and planning skills inmoderate complexity tasks when provided min A. SLP Short Term Goal 2 (Week 2): Patient will recall safe swallow strategies (upright for PO, alternate solids/liquids, slow rate, small bites/sips) with 80% acc when provided min A. SLP Short Term Goal 3 (Week 2): Patient will tolerate Dys 3 diet textures with reduced reguritation episodes using safe swallow strategies. SLP Short Term Goal 4 (Week 2): Patient will complete pharyngeal strengthening exercises as appropriate given mod A in order to improve swallow function SLP Short Term Goal 5 (Week 2): Patient will use compensatory memory strategies to recall functional information in the current environment when provided min A.  Skilled Therapeutic Interventions: Skilled therapy session focused on cognitive goals. Upon entrance, family and nursing report decline in response times and low BP this AM. SLP introduced self and encouraged patient to recall biographical information. Patient shared information re: self, family and home however required significiant processing time. SLP targeted problem solving goals through card sorting task. Patient required minA to sort cards by color, however increased assistance (occasional maxA) required to sort by shape. Patient began to perseverate on foot pain and SLP notified RN. Patient left in bed with alarm set and call bell in reach. Continue POC.  Pain Foot pain - RN aware  Valia Wingard M.A., CCC-SLP Therapy/Group: Individual Therapy  07/27/2024, 9:59 AM

## 2024-07-27 NOTE — Telephone Encounter (Signed)
 Patient Product/process development scientist completed.    The patient is insured through [employer]. Patient has Medicare and is not eligible for a copay card, but may be able to apply for patient assistance or Medicare RX Payment Plan (Patient Must reach out to their plan, if eligible for payment plan), if available.    Ran test claim for Nuzyra 150 mg and the current 30 day co-pay is $710.56.  Ran test claim for Sivextro 200 mg and the current 30 day co-pay is $710.56.  This test claim was processed through [facility]- copay amounts may vary at other pharmacies due to pharmacy/plan contracts, or as the patient moves through the different stages of their insurance plan.     Reyes Sharps, CPHT Pharmacy Technician III Certified Patient Advocate [facility] Patient Advocate Team Direct Number: [phone]  Fax: [phone]

## 2024-07-27 NOTE — Progress Notes (Signed)
 Informed by nurse that patient has had SOB. On exam, disoriented but in NAD. Did get oxycodone  10 mg today for the first time which could be cause of confusion. Patient denied any new pain. Urine in urostomy clear and nephrostomy CXR ordered to rule out overload due to ongoing IVF and was negative for overload.  UA negative for nitrites and will await culture results as he continues on Rocephin  and zyvox . CBC with stable white count, downward trend in sed rate but note rise in CRP.   Was contacted again later with reports that he looked pale, weak and reporting that he was tired of all this.  Asked for rapid to evaluate. On evaluation of notes read report of chest pain. Troponin ordered. Reviewed EKG that showed  progressive prolongation of QT on evaluation of prior EKGs.   Paged  cardiology fellow for input. She recommended d/c of lomotil  with daily EKG to monitor QT or for signs of NSVT. Could d/c zofran  next and try ativan for nausea.   Will order CT head due to ongoing confusion which is reported to be worse today.

## 2024-07-27 NOTE — Progress Notes (Signed)
 Physical Therapy Session Note  Patient Details  Name: Kristopher Thompson MRN: [id number] Date of Birth: 1951/06/24  Today's Date: 07/27/2024 PT Individual Time: 1100-1127 PT Individual Time Calculation (min): 27 min   Short Term Goals: Week 2:  PT Short Term Goal 1 (Week 2): Pt will perform bed mobility with modA consistently. PT Short Term Goal 2 (Week 2): Pt will perform sit to stand with modA consistently. PT Short Term Goal 3 (Week 2): Pt will perform bed to chair with modA consistently. PT Short Term Goal 4 (Week 2): Pt will ambulates x50' with modA +2 and LRAD.   Skilled Therapeutic Interventions/Progress Updates:      Pt in bed to start with his spouse present at the bedside. Spouse reports patient has been having a rough Monday morning due to pain and low BP. Spouse asking if patient can receive pain medication - relayed request to his LPN via secure chat.   Pt noted to be awkwardly positioned in bed, sliding to foot of bed. HOB placed in trendelenburg position to assist him in boosting to [facility] with totalA. Pt noted to have difficulty initiating simple tasks, such as raising his head off the bed.   Pt completed bed level there-ex for BLE strengthening and activity tolerance: -2x5 heel slides -1x20 ankle pumps -1x10 front shoulder raises *pt needing max cueing for sequencing and effort, has significant difficulty initiating.   LPN Present to administer medications for pain. Patient having difficulty initiating taking the pills from nurses hand. Needed hand-over-hand assist to initiate and bring pill to mouth.   Pt left in care of LPN with patient in bed. Needs met.   Therapy Documentation Precautions:  Precautions Precautions: Fall (HOH, Urostomy, Colostomy, PCN  drain, and orthostatic hypotension.) Recall of Precautions/Restrictions: Impaired Precaution/Restrictions Comments: urostomy, colostomy, lt PCN drain, watch BP and HR Restrictions Weight Bearing Restrictions  Per Provider Order: No General:     Therapy/Group: Individual Therapy  Sherlean SHAUNNA Perks 07/27/2024, 7:53 AM

## 2024-07-27 NOTE — Progress Notes (Signed)
 Calorie Count Note Day 1  48 hour calorie count ordered. Day 1 results below, pt only meeting 20% of calorie and 21% of protein needs PO. Pt has not made any progress with intake since initial assessment and continues to need electrolyte repletion periodically likely related to poor PO intake. Skin integrity has continued to worsen, with new tears/ irritants on buttocks and scrotum which may also be due to inadequate protein intake. Will continue calorie count for 1 more day, but pt likely will not meet needs by mouth and needs nutrition support to supplement calorie and protein intake so pt can make continued progress working with therapies. Pt previously unwilling to pursue enteral nutrition, recommend GOC discussion if further nutrition interventions denied.   Diet: DYS 3 w/ thins Supplements: Boost breeze, Ensure, Fairlife shakes  Estimated Nutritional Needs:  Kcal:  2200-2400 Protein:  120-135 g Fluid:  >/=2L   Day 1: 9/14 Breakfast: 100% cheerios (131 kcal, 6 g protein) Lunch: 1 chicken tender from [employer], 25% blueberry yogurt (190 kcal, 13 g protein) Dinner: 25% pot roast w/ gravy, mashed potatoes w/ gravy and chocolate pudding; 12% carrots (117 kcal, 6.5 g protein) Supplements: per RN and wife, no intake of any ONS last day  Total intake: 438 kcal (20% of minimum estimated needs)  25.5 protein (21% of minimum estimated needs)  NUTRITION DIAGNOSIS:  Moderate Malnutrition related to chronic illness (bladder cancer, tachycardia,  hx bowel perforation) as evidenced by moderate fat depletion, moderate muscle depletion, percent weight loss. Remains applicable   GOAL:  Patient will meet greater than or equal to 90% of their needs Progressing  INTERVENTION:  Recommended small, frequent meals while dealing with n/v, encouraged bites of food frequently throughout the day and ordering bland foods   Diet recommendations per SLP assessment Currently on dysphagia 3 w/ thins   Ensure Plus  High Protein po BID or Fairlife shakes from home   Juven BID to support wound healing   MVI with minerals daily   Monitor ostomy output     Josette Glance, MS, RDN, LDN Clinical Dietitian I Please reach out via secure chat

## 2024-07-27 NOTE — Progress Notes (Signed)
 [facility] NEPHROLOGY PROGRESS NOTE  Assessment/ Plan: Pt is a 73 y.o. 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  # Acute kidney injury on CKD IIIA: Likely hemodynamically mediated with hypotension and decreased oral intake.   - predisposed to pre-renal injury with ostomy - Fluids were adjusted to NS with potassium at 40 ml/hr.  I will change back to NS at 75 m/hr - The family and primary team has been concerned about very poor oral intake therefore needing maintenance IV fluid.    # Decreased responsiveness/AMS - this my first meeting with him today  - NS bolus 500 mL once - increase fluids back as above - obtain UA with reflex to a culture   # Hyponatremia: Sodium level improving with fluid.    # Metabolic acidosis, improved and now off of bicarb  # Sepsis due to abdominal wall infection on antibiotics per primary team.  # Hypokalemia: Continue to replete potassium chloride .  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  Disposition - Continue monitoring in rehab     Subjective:  He had 950 mL UOP recorded over 9/14.  In the interim since nephrology eval, he was transitioned from NS at 75 ml/hr to NS with potassium at just 40 ml/hr.  His wife is at bedside.  Rapid RN is here to assess him as his family was concerned about mental status changes.  His wife states he is similar to yesterday but overall worse than last week.  Stool output listed as 800 mL over 9/14.  His wife reports that his urine seems cloudy.   Review of systems:  Limited from the patient secondary to AMS Denies shortness of breath to me  Denies chest pain to me though  reported to rapid RN earlier.    Objective Vital signs in last 24 hours: Vitals:   07/26/24 1938 07/27/24 0640 07/27/24 1450 07/27/24 1642  BP: 94/60 (!) 84/56 92/64 (!) 100/50  Pulse: 70 70 77 62  Resp: 18 18    Temp: 98.8 F (37.1 C) 97.9 F (36.6 C) 97.6 F (36.4 C) 99 F (37.2 C)  TempSrc: Oral  Oral   SpO2: 99% 100% 99% 99%  Weight:      Height:       Weight change:   Intake/Output Summary (Last 24 hours) at 07/27/2024 1725 Last data filed at 07/27/2024 0700 Gross per 24 hour  Intake 248 ml  Output 850 ml  Net -602 ml       Labs: RENAL PANEL Recent Labs  Lab 07/23/24 1740 07/24/24 0609 07/25/24 0609 07/25/24 1318 07/26/24 0529 07/27/24 0229  NA 131* 131* 134*  --  134* 136  133*  K 4.0 3.3* 3.3*  --  3.0* 3.3*  3.2*  CL 104 106 102  --  97* 97*  94*  CO2 15* 14* 19*  --  26 27  29   GLUCOSE 97 90 88  --  82 91  92  BUN 57* 53* 53*  --  49* 43*  44*  CREATININE 2.39* 2.21* 2.22*  --  2.14* 1.98*  1.98*  CALCIUM  10.0 9.5 9.9  --  9.4 8.8*  8.8*  MG  --   --   --   --   --  1.3*  PHOS  --  3.7 3.4  --  2.7 2.4*  ALBUMIN   --  2.1* 2.8* 2.8* 2.5* 2.2*    Liver Function Tests: Recent Labs  Lab 07/23/24 0425 07/24/24 0609 07/25/24 1318 07/26/24 0529 07/27/24 0229  AST 17  --  15  --   --   ALT 13  --  13  --   --   ALKPHOS 81  --  73  --   --   BILITOT 0.4  --  0.4  --   --   PROT 5.5*  --  5.5*  --   --   ALBUMIN  2.2*   < > 2.8* 2.5* 2.2*   < > = values in this interval not displayed.   No results for input(s): LIPASE, AMYLASE in the last 168 hours. Recent Labs  Lab 07/25/24 1318  AMMONIA 21   CBC: Recent Labs    05/02/24 0515 05/04/24 0341 07/20/24 0506 07/22/24 0729 07/23/24 0430 07/25/24 1318 07/27/24 0229  HGB  --    < > 11.9* 11.9* 11.3* 10.9* 10.6*  MCV  --    < > 83.5 83.4 82.4 80.1 82.7  FERRITIN 588*  --   --   --   --   --   --   TIBC 281  --   --   --   --   --   --   IRON  54  --   --   --   --   --   --     < > = values in this interval not displayed.    Cardiac Enzymes: No results for input(s): CKTOTAL, CKMB, CKMBINDEX, TROPONINI in the last 168 hours. CBG: Recent Labs  Lab 07/27/24 1639  GLUCAP 103*    Iron  Studies: No results for input(s): IRON , TIBC, TRANSFERRIN, FERRITIN in the last 72 hours. Studies/Results: US  EKG SITE RITE Result Date: 07/26/2024 If Site Rite image not attached, placement could not be confirmed due to current cardiac rhythm.   Medications: Infusions:  0.9 % NaCl with KCl 40 mEq / L 40 mL/hr at 07/27/24 1508   cefTRIAXone  (ROCEPHIN )  IV 2 g (07/27/24 0004)   linezolid  (ZYVOX ) IV 600 mg (07/27/24 0908)    Scheduled Medications:  apixaban   5 mg Oral BID   childrens multivitamin  1 tablet Oral BID   Chlorhexidine  Gluconate Cloth  6 each Topical Q12H   cyanocobalamin   500 mcg Oral Q M,W,F   feeding supplement  1 Container Oral TID WC   feeding supplement  237 mL Oral BID BM   fludrocortisone   0.2 mg Oral Daily   Gerhardt's butt cream  1 Application Topical BID   leptospermum manuka honey  1 Application Topical Daily   loperamide   4 mg Oral TID   magnesium  gluconate  500 mg Oral Daily   melatonin  3 mg Oral QHS   midodrine   20 mg Oral TID WC   nutrition supplement (JUVEN)  1 packet Oral BID BM   omega-3 acid ethyl esters  1 g Oral QODAY   ondansetron  (ZOFRAN ) IV  4 mg Intravenous TID AC   polycarbophil  625 mg Oral BID   [START ON 07/28/2024] potassium chloride   20 mEq Oral Daily   rosuvastatin   10 mg Oral QPM   sodium chloride  flush  10-40 mL Intracatheter Q12H    have reviewed scheduled and prn medications.  Physical Exam:   General elderly male in bed in no acute distress  HEENT normocephalic atraumatic extraocular movements intact sclera anicteric Neck supple trachea midline Lungs clear to auscultation bilaterally normal work of breathing at rest on room air Heart S1S2 no rub Abdomen soft nontender nondistended; left  nephrostomy bag to gravity Extremities no edema appreciated Psych blunted affect  Neuro awake; provides his first name; names his wife, states year is '1933 and that we are in a doctor's office     Kristopher Thompson 07/27/2024,5:54 PM  LOS: 16 days

## 2024-07-27 NOTE — Progress Notes (Signed)
 Occupational Therapy Session Note  Patient Details  Name: Kristopher Thompson MRN: [id number] Date of Birth: October 14, 1951  Today's Date: 07/27/2024 OT Individual Time: 9254-9159 OT Individual Time Calculation (min): 55 min    Short Term Goals: Week 2:  OT Short Term Goal 1 (Week 2): Pt will stand from w/c with mod A OT Short Term Goal 2 (Week 2): Pt will complete LB dressing with mod A OT Short Term Goal 3 (Week 2): Pt will complete ADL routine with no more than 2 rest breaks OT Short Term Goal 4 (Week 2): Pt will complete stand pivot transfer to w/c with mod A using LRAD  Skilled Therapeutic Interventions/Progress Updates:    Pt received supine, his wife present and reporting he is off today. Pt assessed- he was oriented to hospital when given choices but spontaneously stated norleen kotyk when asked and he was not oriented to any time. He was very slow to respond and process all commands/questions today. Discussed with his wife at length within Ot scope. Skilled monitoring of vitals throughout session to ensure hemodynamic and cardiorespiratory stability- extra time spent doing this today to assess appropriateness for therapy. BP's listed below as they were taken. Consulted with PA and MD aware of hypotension and continued cognitive decline. He rolled R and L with max A, requiring max facilitation to bring his hand to the bed rail but was able to maintain grasp once placed there. He was found to be saturated in urine/residue leaking from urostomy bag. Notified RN. Wife completed bag change. Assisted in changing gown at bed level as well as brief and pad via rolling twice more with similar assist level. Encouraged his wife to elevate HOB once she was done with bag change to maximize BP support in prep for next sessions. He was left supine with all needs met.   Supine upon arrival: 83/64, HR 74 Supine 5 min later: 78/53, HR 70 TEDS donned and pt laid flat: 83/51, HR 71 Supine with teds on and ace  wraps on: 91/54  Close of session with thigh high teds and ace wrap, supine: 97/65   Therapy Documentation Precautions:  Precautions Precautions: Fall (HOH, Urostomy, Colostomy, PCN  drain, and orthostatic hypotension.) Recall of Precautions/Restrictions: Impaired Precaution/Restrictions Comments: urostomy, colostomy, lt PCN drain, watch BP and HR Restrictions Weight Bearing Restrictions Per Provider Order: No  Therapy/Group: Individual Therapy  [name] 07/27/2024, 7:44 AM

## 2024-07-27 NOTE — Progress Notes (Signed)
 Speech Language Pathology Weekly Progress and Session Note  Patient Details  Name: Kristopher Thompson MRN: [id number] Date of Birth: July 22, 1951  Beginning of progress report period: July 20, 2024 End of progress report period: July 27, 2024   Short Term Goals: Week 2: SLP Short Term Goal 1 (Week 2): Patient will improve problem solving and planning skills inmoderate complexity tasks when provided min A. SLP Short Term Goal 1 - Progress (Week 2): Not met SLP Short Term Goal 2 (Week 2): Patient will recall safe swallow strategies (upright for PO, alternate solids/liquids, slow rate, small bites/sips) with 80% acc when provided min A. SLP Short Term Goal 2 - Progress (Week 2): Not met SLP Short Term Goal 3 (Week 2): Patient will tolerate Dys 3 diet textures with reduced reguritation episodes using safe swallow strategies. SLP Short Term Goal 3 - Progress (Week 2): Not met SLP Short Term Goal 4 (Week 2): Patient will complete pharyngeal strengthening exercises as appropriate given mod A in order to improve swallow function SLP Short Term Goal 4 - Progress (Week 2): Not met SLP Short Term Goal 5 (Week 2): Patient will use compensatory memory strategies to recall functional information in the current environment when provided min A. SLP Short Term Goal 5 - Progress (Week 2): Not met    New Short Term Goals: Week 3: SLP Short Term Goal 1 (Week 3): Patient will demonstrate basic problem solving skills with max multimodal A SLP Short Term Goal 2 (Week 3): Patient will recall daily information with supervision verbal A SLP Short Term Goal 3 (Week 3): Patient will express wants/needs given min multimodal A SLP Short Term Goal 4 (Week 3): Patient will consume least restrictive diet with mod multimodal A for use of compensatory strategies SLP Short Term Goal 5 (Week 3): Patient will participate in pharyngeal strengthening exercises  Weekly Progress Updates: Pt has made limited gains this  reporting period due to significant lethargy and fluctuating cognitive status. Patient continues to consume D3/thin diet however is requiring maxA to complete pharyngeal strengthening exercises. Patient with delayed cognitive processing this reporting period and is requiring fluctuating assistance levels to complete problem solving activities. Pt/family eduction ongoing. Pt would benefit from continued ST intervention to maximize dysphagia, communication and cognition in order to maximize functional independence at d/c.    Intensity: Minumum of 1-2 x/day, 30 to 90 minutes Frequency: 3 to 5 out of 7 days Duration/Length of Stay: TBD Treatment/Interventions: Cognitive remediation/compensation;Functional tasks;Internal/external aids;Patient/family education;Medication managment;Dysphagia/aspiration precaution training;Therapeutic Activities;Therapeutic Exercise  Therapy/Group: Individual Therapy  Mikeya Tomasetti M.A., CCC-SLP 07/27/2024, 12:23 PM

## 2024-07-27 NOTE — Significant Event (Addendum)
 Rapid Response Note   Called to bedside for second set of eyes. Wife at bedside states pt mentation worse today, and suddenly short of breath and complains of chest pain. On evaluation, pt in NAD, A&O to self only. Cheeks flushed, skin warm/dry. Nonsensical speech, perseverating at times. Lungs clear/diminished. Heart tones normal. Minimal edema noted--bilateral legs with thigh high TED hose and wrapped in ace. EKG completed due to statement to wife of chest pressure, please help.  99/62 (72) HR 64 RR 19 O2 100% RA 98.2 oral CBG 103  MD Notified: PA by primary RN Call Time: 1726 Arrival Time: 1730 End Time: 1755  Tonna Chiquita POUR, RN

## 2024-07-27 NOTE — Progress Notes (Signed)
 Physical Therapy Session Note  Patient Details  Name: [name] MRN: [id number] Date of Birth: Dec 03, 1950  Today's Date: 07/27/2024 PT Missed Time: 45 Minutes Missed Time Reason: Patient ill (Comment) (hypotensive in supine)  Short Term Goals: Week 2:  PT Short Term Goal 1 (Week 2): Pt will perform bed mobility with modA consistently. PT Short Term Goal 2 (Week 2): Pt will perform sit to stand with modA consistently. PT Short Term Goal 3 (Week 2): Pt will perform bed to chair with modA consistently. PT Short Term Goal 4 (Week 2): Pt will ambulates x50' with modA +2 and LRAD.  Skilled Therapeutic Interventions/Progress Updates:     Pt in bed supine upon PT arrival. Pt appears more lethargic and confused that previous session with this therapist. No complaint of pain. PT takes blood pressure in supine with reading of 76/52. PA Pam alerted to hypotension and pt misses 45 minutes of skilled PT due to hypotension and confusion. PT will follow up as able.   Therapy Documentation Precautions:  Precautions Precautions: Fall (HOH, Urostomy, Colostomy, PCN  drain, and orthostatic hypotension.) Recall of Precautions/Restrictions: Impaired Precaution/Restrictions Comments: urostomy, colostomy, lt PCN drain, watch BP and HR Restrictions Weight Bearing Restrictions Per Provider Order: No General: PT Amount of Missed Time (min): 45 Minutes PT Missed Treatment Reason: Patient ill (Comment) (hypotensive in supine)   Therapy/Group: Individual Therapy  Elsie JAYSON Dawn, PT, DPT 07/27/2024, 3:17 PM

## 2024-07-27 NOTE — Progress Notes (Signed)
 PROGRESS NOTE   Subjective/Complaints:  Pt with lo bp this morning 70's/40's. Felt washed out. Better since he laid back down in bed. Received midodrine . Had legs wrapped. Po intake still around 20-30%. Has nausea which inhibits him also. Nephrostomy tube with some leakage.   ROS: Patient denies fever, rash, sore throat, blurred vision, dizziness,  , vomiting, diarrhea, cough, shortness of breath or chest pain, joint or back/neck pain, headache, or mood change.   Objective:   US  EKG SITE RITE Result Date: 07/26/2024 If Site Rite image not attached, placement could not be confirmed due to current cardiac rhythm.   Recent Labs    07/25/24 1318 07/27/24 0229  WBC 6.9 7.1  HGB 10.9* 10.6*  HCT 32.7* 32.9*  PLT 285 215    Recent Labs    07/26/24 0529 07/27/24 0229  NA 134* 136  133*  K 3.0* 3.3*  3.2*  CL 97* 97*  94*  CO2 26 27  29   GLUCOSE 82 91  92  BUN 49* 43*  44*  CREATININE 2.14* 1.98*  1.98*  CALCIUM  9.4 8.8*  8.8*    Intake/Output Summary (Last 24 hours) at 07/27/2024 0956 Last data filed at 07/27/2024 0700 Gross per 24 hour  Intake 870.5 ml  Output 1650 ml  Net -779.5 ml        Physical Exam: Vital Signs Blood pressure (!) 84/56, pulse 70, temperature 97.9 F (36.6 C), resp. rate 18, height 5' 11 (1.803 m), weight 90 kg, SpO2 100%.   Constitutional: No distress . Vital signs reviewed. HEENT: NCAT, EOMI, oral membranes moist Neck: supple Cardiovascular: RRR without murmur. No JVD    Respiratory/Chest: CTA Bilaterally without wheezes or rales. Normal effort    GI/Abdomen: BS +, non-tender, non-distended. Ostomy with liquid brown stool Ext: no clubbing, cyanosis, or edema Psych: pleasant and cooperative  Skin: abdominal wound continues to close with pink with granulation tissue, moist. Nephrostomy tube with bag seal in place.   Neuro:  alert but slow processing. Follows basic commands  with extra time.  Attention and recall poor.--Slightly improved 9-14; can now recognize deficits but cannot answer questions correctly  Normal language and speech.  Cranial nerve exam unremarkable.  MMT: BUE 4/5 prox to distal. BLE 3-/5 HF, 4-KE and 4/5 ADF/PF. Inconsistent effort No limb ataxia or cerebellar signs.  Bilateral upper and lower extremity intention tremors--ongoing. No abnormal tone appreciated.   Musculoskeletal: Able to straighten both knees out on bed.  Plantarflexion tone bilateral lower extremities, barely able to range to neutral.   Assessment/Plan: 1. Functional deficits which require 3+ hours per day of interdisciplinary therapy in a comprehensive inpatient rehab setting. Physiatrist is providing close team supervision and 24 hour management of active medical problems listed below. Physiatrist and rehab team continue to assess barriers to discharge/monitor patient progress toward functional and medical goals  Care Tool:  Bathing    Body parts bathed by patient: Chest, Face, Abdomen, Right arm, Left arm, Front perineal area, Right upper leg, Left upper leg   Body parts bathed by helper: Buttocks, Right lower leg, Left lower leg     Bathing assist Assist Level: Moderate Assistance - Patient 50 -  74%     Upper Body Dressing/Undressing Upper body dressing   What is the patient wearing?: Hospital gown only    Upper body assist Assist Level: Moderate Assistance - Patient 50 - 74%    Lower Body Dressing/Undressing Lower body dressing      What is the patient wearing?: Pants     Lower body assist Assist for lower body dressing: Maximal Assistance - Patient 25 - 49%     Toileting Toileting    Toileting assist Assist for toileting:  (patient has colosteomy and urostomy)     Transfers Chair/bed transfer  Transfers assist  Chair/bed transfer activity did not occur: Safety/medical concerns  Chair/bed transfer assist level: Maximal Assistance - Patient  25 - 49%     Locomotion Ambulation   Ambulation assist   Ambulation activity did not occur: Safety/medical concerns (fatigue/dizziness/orthostatics)          Walk 10 feet activity   Assist  Walk 10 feet activity did not occur: Safety/medical concerns (fatigue/dizziness/orthostatics)        Walk 50 feet activity   Assist Walk 50 feet with 2 turns activity did not occur: Safety/medical concerns (fatigue/dizziness/orthostatics)         Walk 150 feet activity   Assist Walk 150 feet activity did not occur: Safety/medical concerns (fatigue/dizziness/orthostatics)         Walk 10 feet on uneven surface  activity   Assist Walk 10 feet on uneven surfaces activity did not occur: Safety/medical concerns (fatigue/dizziness/orthostatics)         Wheelchair     Assist Is the patient using a wheelchair?: Yes Type of Wheelchair: Manual           Wheelchair 50 feet with 2 turns activity    Assist    Wheelchair 50 feet with 2 turns activity did not occur: Safety/medical concerns       Wheelchair 150 feet activity     Assist  Wheelchair 150 feet activity did not occur: Safety/medical concerns       Blood pressure (!) 84/56, pulse 70, temperature 97.9 F (36.6 C), resp. rate 18, height 5' 11 (1.803 m), weight 90 kg, SpO2 100%. Medical Problem List and Plan: 1. 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  Undergoing medical workups as below, will monitor improvement for the next couple of days --address at teams if current level of care exceeds ability of home caregivers.  - 9-13: Bilateral lower extremity PRAFO's in bed for plantarflexion/tight heel cords  --Continue CIR therapies including PT, OT, and SLP. Progress has been very  slow. May need to consider other discharge venue  2.  H/o PE/Antithrombotics: -DVT/anticoagulation:  Pharmaceutical: Eliquis              -antiplatelet therapy: N/A  - No further blood appreciated in nephrostomy tubes  3. Pain Management: Oxycodone  prn.  4. Mood/Behavior/Sleep: LCSW to follow for evaluation and support.              --melatonin for insomnia.              -antipsychotic agents: N/A  9-14: Per patient, sleeping well.  Sleep log.  Discussed delirium management with patient's wife  5. Neuropsych/cognition/encephalopathy secondary to  metabolic acidosis: This patient is not capable of making decisions on his own behalf.  - 9-11: Increasingly withdrawn, behavioral changes may be secondary to poor nutritional intake and hyponatremia.  Treating as below, on sertraline  100 mg daily, may need to add additional agent such as mirtazapine but hesitant given ongoing cognitive issues.  9-12: Slightly better today, but consistent with hypoactive delirium with primary difficulty with concentration, no focal neurologic deficits.  Start overnight sleep log, delirium precautions.  Suspect hyponatremia as primary contributor, management as below.  9/13: D3C Flexeril , Megace , Zoloft  due to ongoing cognitive changes.  Sodium is improving.  Get LFTs, ammonia today to evaluate ongoing cognitive deficits--within normal limits.  -14: Cognition seems to be clearing a bit, continue current regimen.   9/15 alert but delayed. Continue to maximize renal/volume/nutritional status 6. Skin/Wound Care: Routine pressure relief measures. Encourage adequate diet             --dressing changes daily. Gerhardt's cream to buttock wound.   -abdominal wound continues to close despite tenous nutritional status 7. Fluids/Electrolytes/Nutrition/oropharyngeal dysphagia/moderate to severe protein calorie malnutrition:                --9/10 periactin  yesterday stopped d/t lethargy   -will try megace  beginning today   -RD  f/u   -push fluids   -recheck bmet in am   -continue juven bid. Has been refusing Ensure supplement.   -kdur 20meq daily   -can't use zofran  d/t QTc prolongation (490)  - MBS   9/3--D3/thins, prealbumin borderline normal at 20   - 9-11: Nausea remains a primary barrier to adequate p.o. intakes.  Recheck EKG today, QTc was 460, scheduled Zofran  4 mg IV 3 times daily AC.  Will repeat EKG in next 1 to 2 days.  - 9-12: Albumin  infusion per nephrology.  P.o. intakes improving with Zofran .  per IV team midline contraindicated due to bicarb.  May get PICC line once dialysis no longer considered.  9-14: P.o. intakes okay, Zofran  continues to help with nausea.  Per nephrology, okay to reduce fluids, will keep on 70 cc/h normal saline for maintenance for now.  PICC line order placed.  9/15 intake 20-30%. Receiving mtc IVF.    -k+ 3.3, mg++ 1.3   -continue kdur 20meq daily and add 40meq to IVF   -IV mag sulfae 2g x1, add mag gluconate oral daily   8. Sepsis/Abdominal wall/mesh infection: IV Vanc/Ceftriaxone  for 6 weeks from 08/21             --Probable 6 months total of antibiotics--ID follow up 09/18 @10  am --Corynebacterium species-->6 more weeks ?Dalbavancin for OP use, cont ceftriazone and linezolid  for now  per ID -appreciate recent ID f/u CRP nl 0.8 ESR 44 WBC 8.2 9-11: WBC labs remained stable. 9/15 wbc 7.1 9. Left> right hydronephrosis: Nephrostomy tubes with stent exchanged on 08/27 --continue clamping right tube and left to drain per urology.  Monitor for blood recheck CBC in am --hemoglobin stable, no further bloody output appreciated -9/15 flush right nephrostomy tube 10. Acute on chronic renal failure/hyponatremia:  elevated CR- baseline creat probably ~1.4  -9/10 will give addnl IVF today d/t poor po intake   -recheck bmet in am  - 9-11: AKI significantly worse, now with hyponatremia 131.  Will give 1 L fluid bolus normal saline today, recheck BMP; given brisk uptrend despite  increase in IV fluids will consult nephrology for further recommendations.  - 9-12: Appreciate nephrology input and recommendations.  Responding slightly to  increased IV fluids overnight, normal saline 125/h.  Hyponatremia remains 131. On multiple medications that could be contributing including Florinef , sertraline , and linezolid .  See their note for specific recommendations; advised to remain on fluids for primary treatment of hyponatremia, do not recommend salt tabs at this time.  - If renal function does not improve with fluids may need CT scan of the abdomen and pelvis to verify patency of nephrostomy/ileal conduit drainage   9-13: Potassium chloride  packet 40 mill equivalents ordered this a.m. for repletion.  Sodium improving, bicarb improving, but renal function remains unchanged on bicarb drip.  9-14: Renal function improved, acidosis resolved, sodium 134.  Per nephrology, can DC fluids, but agreeable to maintenance fluids given ongoing poor p.o. intakes.  Continue to monitor closely.  9/15 Cr continues to improve gradually, continue IVF, push po 11. OSA: Encourage CPAP use 12. Hypotension/tachycardia: Continue Midodrine  15 mg TID and Florinef  2 mg daily             --continues to have significant tachycardia with activity likely due to orthostasis and deconditioning.  - -hgb 11.5   -continue metoprolol  low dose 12.5mg  bid, bp remains soft but stable at present   -  IVF as above Per PT- has been not dropping in therapy   9-13: Orthostatic today, DC Lopressor  and increase midodrine  to 20 mg 3 times daily.  Has been getting a significant amount of IV fluid last few days, still likely volume depleted per nephrology.  9-14: Blood pressure remains stable, but soft. 9/15 hypotensive this morning.  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    9/1 held reglan   - may need to resume if not tolerating zofran ; output remains grossly liquid  14. Depression:  On Zoloft  15. Malnutrition: Albumin  @1 .9/TP-5.1.  --> 2.2 9/11;  - 9-11: Adding Premeal Zofran  as above.  Will get nutrition to start calorie count; may need additional supplementation or discussion of feeding tube if does not improve.  - 9-12: Slightly improved, continue this regimen.  9-13: Very poor p.o. intakes yesterday.  DC Megace  due to possible cognitive effects, no perceived benefit.  Will discuss possible feeding tube with family--patient rejects, will get calorie count for 48 hours and if no improvement may readdress tube feed versus palliative consult  9-14: Wife encouraging p.o.'s, patient motivated to avoid feeding tube.  Calorie count started.  Maintenance fluids as above.  9/15 calorie ct ongoing, electrolyte/fluid replacement 16. Anemia of chronic illness: Stable overall 9/8-11  - Hemoglobin stable 9-15       Latest Ref Rng & Units 07/27/2024    2:29 AM 07/25/2024    1:18 PM 07/23/2024    4:30 AM  CBC  WBC 4.0 - 10.5 K/uL 7.1  6.9  8.5   Hemoglobin 13.0 - 17.0 g/dL 89.3  89.0  88.6   Hematocrit 39.0 - 52.0 % 32.9  32.7  34.7   Platelets 150 - 400 K/uL 215  285  351          LOS: 16 days A FACE TO FACE EVALUATION WAS PERFORMED  Arthea ONEIDA Gunther 07/27/2024, 9:56 AM

## 2024-07-28 LAB — URINALYSIS, W/ REFLEX TO CULTURE (INFECTION SUSPECTED)
Bilirubin Urine: NEGATIVE
Glucose, UA: NEGATIVE mg/dL
Ketones, ur: NEGATIVE mg/dL
Nitrite: NEGATIVE
Protein, ur: 100 mg/dL — AB
RBC / HPF: >50 RBC/hpf (ref 0–5)
Specific Gravity, Urine: 1.006 (ref 1.005–1.030)
WBC, UA: >50 WBC/hpf (ref 0–5)
pH: 7 (ref 5.0–8.0)

## 2024-07-28 LAB — RENAL FUNCTION PANEL
Albumin: 2 g/dL — ABNORMAL LOW (ref 3.5–5.0)
Anion gap: 11 (ref 5–15)
BUN: 38 mg/dL — ABNORMAL HIGH (ref 8–23)
CO2: 25 mmol/L (ref 22–32)
Calcium: 8.6 mg/dL — ABNORMAL LOW (ref 8.9–10.3)
Chloride: 102 mmol/L (ref 98–111)
Creatinine, Ser: 2.01 mg/dL — ABNORMAL HIGH (ref 0.61–1.24)
GFR, Estimated: 35 mL/min — ABNORMAL LOW (ref >60)
Glucose, Bld: 102 mg/dL — ABNORMAL HIGH (ref 70–99)
Phosphorus: 2.5 mg/dL (ref 2.5–4.6)
Potassium: 3.5 mmol/L (ref 3.5–5.1)
Sodium: 138 mmol/L (ref 135–145)

## 2024-07-28 LAB — MAGNESIUM: Magnesium: 1.9 mg/dL (ref 1.7–2.4)

## 2024-07-28 LAB — TROPONIN I (HIGH SENSITIVITY)
Troponin I (High Sensitivity): 15 ng/L (ref <18)
Troponin I (High Sensitivity): 15 ng/L (ref <18)

## 2024-07-28 MED ORDER — ACETAMINOPHEN 325 MG PO TABS: 325.0000 mg | ORAL_TABLET | ORAL | Status: DC | PRN

## 2024-07-28 MED ORDER — OLANZAPINE 5 MG PO TBDP: 5.0000 mg | ORAL_TABLET | Freq: Every day | ORAL | Status: DC | PRN

## 2024-07-28 MED ORDER — OXYCODONE-ACETAMINOPHEN 5-325 MG PO TABS
1.0000 | ORAL_TABLET | Freq: Three times a day (TID) | ORAL | Status: DC | PRN
  Filled 2024-07-28 (×4): qty 1

## 2024-07-28 MED ORDER — METOCLOPRAMIDE HCL 5 MG/5ML PO SOLN
5.0000 mg | Freq: Three times a day (TID) | ORAL | Status: DC
  Administered 2024-07-28 – 2024-08-07 (×31): 5 mg via ORAL
  Filled 2024-07-28 (×34): qty 10

## 2024-07-28 NOTE — Progress Notes (Signed)
 Calorie Count Note  48 hour calorie count ordered and now completed. Day 2 results below, pt only meeting 22% of calorie and 13% of protein needs PO. Overall throughout two day period, pt only meeting 20-22% calorie needs and 13-21% protein needs. Pt has not made any progress with intake since initial assessment and continues to need electrolyte repletion periodically likely related to poor PO intake. Skin integrity has continued to worsen, with new tears/ irritants on buttocks and scrotum which may also be due to inadequate protein intake.   Recommendation remains to initiate enteral feeds via nutrition support. Pt needs nutrition support to supplement calorie and protein intake so pt can make continued progress working with therapies. Spoke with pt yesterday regarding current nutrition status. Discussed that the only intervention left is nutrition support but there are no other options if pt denies that. Pt will continue to decline if PO intake does not significantly improve. Strongly recommend palliative consult to further discuss GOC.  Diet: DYS 3 w/ thins Supplements: Boost breeze, Ensure, Fairlife shakes  Estimated Nutritional Needs:  Kcal:  2200-2400 Protein:  120-135 g Fluid:  >/=2L   Day 1: 9/15 Breakfast: 75% cheerios (98 kcal, 4.5 g protein) Lunch: 10% blueberry yogurt (8 kcal, 1 g protein) Dinner: 75% strawberry Cook[employer] Milkshake (383 kcal, 10 g protein) Supplements: per RN and wife, no intake of any ONS last day  Total intake: 489 kcal (22% of minimum estimated needs)  15.5 protein (13% of minimum estimated needs)  NUTRITION DIAGNOSIS:  Moderate Malnutrition related to chronic illness (bladder cancer, tachycardia,  hx bowel perforation) as evidenced by moderate fat depletion, moderate muscle depletion, percent weight loss. Remains applicable   GOAL:  Patient will meet greater than or equal to 90% of their needs Progressing  INTERVENTION:  Recommended small, frequent  meals while dealing with n/v, encouraged bites of food frequently throughout the day and ordering bland foods   Diet recommendations per SLP assessment Currently on dysphagia 3 w/ thins   Ensure Plus High Protein po BID or Fairlife shakes from home   Juven BID to support wound healing   MVI with minerals daily   Monitor ostomy output     Josette Glance, MS, RDN, LDN Clinical Dietitian I Please reach out via secure chat

## 2024-07-28 NOTE — Progress Notes (Signed)
  [facility] PROGRESS NOTE  Assessment/ Plan: Pt is a 73 y.o. 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  # Acute kidney injury on CKD IIIA: Likely hemodynamically mediated with hypotension and decreased oral intake.   - predisposed to pre-renal injury with ostomy - NS at 75 ml/hr x another 24 hours - The family and primary team has been concerned about very poor oral intake therefore needing maintenance IV fluid.   - renal panel in AM    # Decreased responsiveness/AMS - improved - concurrent with BP and fluids  - s/p NS bolus 500 mL once on 9/15 and increase in rate of NS back to 75 ml/hr - obtain UA with reflex to a culture  - culture pending  - note he has been on ceftriaxone  several days since 8/31    # Hyponatremia: Sodium level improving with fluids.    # Metabolic acidosis, improved and now off of bicarb  # Sepsis due to abdominal wall infection on antibiotics per primary team.  # Hypokalemia: Continue to replete potassium chloride  - he is ordered for 20 meq potassium daily.  Hypomagnesemia which was repleted.     # Critical care myopathy: Currently in inpatient rehab.  Disposition - Continue monitoring in rehab     Subjective:  He had 2.0 L UOP recorded over 9/15.  He has been on NS at 75 ml/hr.  Stool output listed as 325 mL over 9/15.  Spoke with his wife at bedside.  He has seemed better today to her  Review of systems:    Denies shortness of breath or chest pain Denies n/v   Objective Vital signs in last 24 hours: Vitals:   07/28/24 0121 07/28/24 0346 07/28/24 0623 07/28/24 1536  BP: (!) 100/58 100/63  (!) 90/54  Pulse: 76 79  86  Resp: 20 17  18   Temp: 98.3 F (36.8 C)  98 F (36.7 C)  97.8 F (36.6 C)  TempSrc: Axillary   Oral  SpO2: 99% 98%  100%  Weight:   105 kg   Height:       Weight change:   Intake/Output Summary (Last 24 hours) at 07/28/2024 1744 Last data filed at 07/28/2024 1600 Gross per 24 hour  Intake 3760.47 ml  Output 2475 ml  Net 1285.47 ml       Labs: RENAL PANEL Recent Labs  Lab 07/24/24 0609 07/25/24 0609 07/25/24 1318 07/26/24 0529 07/27/24 0229 07/28/24 0250  NA 131* 134*  --  134* 136  133* 138  K 3.3* 3.3*  --  3.0* 3.3*  3.2* 3.5  CL 106 102  --  97* 97*  94* 102  CO2 14* 19*  --  26 27  29 25   GLUCOSE 90 88  --  82 91  92 102*  BUN 53* 53*  --  49* 43*  44* 38*  CREATININE 2.21* 2.22*  --  2.14* 1.98*  1.98* 2.01*  CALCIUM  9.5 9.9  --  9.4 8.8*  8.8* 8.6*  MG  --   --   --   --  1.3* 1.9  PHOS 3.7 3.4  --  2.7 2.4* 2.5  ALBUMIN  2.1* 2.8* 2.8* 2.5* 2.2* 2.0*    Liver Function Tests: Recent Labs  Lab  07/23/24 0425 07/24/24 0609 07/25/24 1318 07/26/24 0529 07/27/24 0229 07/28/24 0250  AST 17  --  15  --   --   --   ALT 13  --  13  --   --   --   ALKPHOS 81  --  73  --   --   --   BILITOT 0.4  --  0.4  --   --   --   PROT 5.5*  --  5.5*  --   --   --   ALBUMIN  2.2*   < > 2.8* 2.5* 2.2* 2.0*   < > = values in this interval not displayed.   No results for input(s): LIPASE, AMYLASE in the last 168 hours. Recent Labs  Lab 07/25/24 1318  AMMONIA 21   CBC: Recent Labs    05/02/24 0515 05/04/24 0341 07/20/24 0506 07/22/24 0729 07/23/24 0430 07/25/24 1318 07/27/24 0229  HGB  --    < > 11.9* 11.9* 11.3* 10.9* 10.6*  MCV  --    < > 83.5 83.4 82.4 80.1 82.7  FERRITIN 588*  --   --   --   --   --   --   TIBC 281  --   --   --   --   --   --   IRON  54  --   --   --   --   --   --    < > = values in this interval not displayed.    Cardiac Enzymes: No results for input(s): CKTOTAL, CKMB, CKMBINDEX, TROPONINI in the last 168 hours. CBG: Recent Labs  Lab 07/27/24 1639   GLUCAP 103*    Iron  Studies: No results for input(s): IRON , TIBC, TRANSFERRIN, FERRITIN in the last 72 hours. Studies/Results: CT HEAD WO CONTRAST ( ) Result Date: 07/28/2024 CLINICAL DATA:  confusion EXAM: CT HEAD WITHOUT CONTRAST TECHNIQUE: Contiguous axial images were obtained from the base of the skull through the vertex without intravenous contrast. RADIATION DOSE REDUCTION: This exam was performed according to the departmental dose-optimization program which includes automated exposure control, adjustment of the mA and/or kV according to patient size and/or use of iterative reconstruction technique. COMPARISON:  CT head April 16, 2024. FINDINGS: Brain: No evidence of acute infarction, hemorrhage, hydrocephalus, extra-axial collection or mass lesion/mass effect. Vascular: No hyperdense vessel or unexpected calcification. Skull: No acute fracture. Sinuses/Orbits: Clear sinuses.  No acute orbital findings. Other: No mastoid effusions. IMPRESSION: No evidence of acute intracranial abnormality. Electronically Signed   By: Gilmore GORMAN Molt M.D.   On: 07/28/2024 03:47   DG CHEST PORT 1 VIEW Result Date: 07/27/2024 CLINICAL DATA:  Shortness of breath EXAM: PORTABLE CHEST 1 VIEW COMPARISON:  07/01/2024 FINDINGS: Cardiac shadow is stable. Right PICC is noted in satisfactory position. The lungs are clear. No bony abnormality is noted. IMPRESSION: No active disease. Electronically Signed   By: Oneil Devonshire M.D.   On: 07/27/2024 20:32    Medications: Infusions:  sodium chloride  75 mL/hr at 07/28/24 0455   cefTRIAXone  (ROCEPHIN )  IV Stopped (07/28/24 0151)   linezolid  (ZYVOX ) IV 600 mg (07/28/24 1041)    Scheduled Medications:  apixaban   5 mg Oral BID   childrens multivitamin  1 tablet Oral BID   Chlorhexidine  Gluconate Cloth  6 each Topical Q12H   cyanocobalamin   500 mcg Oral Q M,W,F   feeding supplement  1 Container Oral TID WC   feeding supplement  237 mL Oral BID BM   fludrocortisone   0.2 mg Oral Daily   Gerhardt's butt cream  1 Application Topical BID   leptospermum manuka honey  1 Application Topical Daily   loperamide   4 mg Oral TID   magnesium  gluconate  500 mg Oral Daily   melatonin  3 mg Oral QHS   metoCLOPramide   5 mg Oral TID AC   midodrine   20 mg Oral TID WC   nutrition supplement (JUVEN)  1 packet Oral BID BM   omega-3 acid ethyl esters  1 g Oral QODAY   polycarbophil  625 mg Oral BID   potassium chloride   20 mEq Oral Daily   rosuvastatin   10 mg Oral QPM   sodium chloride  flush  10-40 mL Intracatheter Q12H    have reviewed scheduled and prn medications.  Physical Exam:     General elderly male in bed in no acute distress HEENT normocephalic atraumatic extraocular movements intact sclera anicteric Neck supple trachea midline Lungs clear to auscultation bilaterally normal work of breathing at rest on room air Heart S1S2 no rub Abdomen soft nontender nondistended; left nephrostomy bag; ostomy in place Extremities no edema appreciated Psych blunted affect  Neuro awake and oriented to person, location of a hospital in [place]; states year is 1976 then 1977    Katheryn JAYSON Saba 07/28/2024, 6:06 PM  LOS: 17 days

## 2024-07-28 NOTE — Plan of Care (Signed)
  Problem: Consults Goal: RH GENERAL PATIENT EDUCATION Description: See Patient Education module for education specifics. Outcome: Progressing   Problem: RH BOWEL ELIMINATION Goal: RH STG MANAGE BOWEL WITH ASSISTANCE Description: STG Manage Bowel with supervision Assistance. Outcome: Progressing   Problem: RH BLADDER ELIMINATION Goal: RH STG MANAGE BLADDER WITH ASSISTANCE Description: STG Manage Bladder With supervision Assistance Outcome: Progressing   Problem: RH SKIN INTEGRITY Goal: RH STG SKIN FREE OF INFECTION/BREAKDOWN Description: Manage skin free of infection with supervision assistance Outcome: Progressing   Problem: RH SAFETY Goal: RH STG ADHERE TO SAFETY PRECAUTIONS W/ASSISTANCE/DEVICE Description: STG Adhere to Safety Precautions With Assistance/Device. Outcome: Progressing   Problem: RH PAIN MANAGEMENT Goal: RH STG PAIN MANAGED AT OR BELOW PT'S PAIN GOAL Description: <4 w/ prns Outcome: Progressing   Problem: RH KNOWLEDGE DEFICIT GENERAL Goal: RH STG INCREASE KNOWLEDGE OF SELF CARE AFTER HOSPITALIZATION Description: Manage increase knowledge of self care care after hospitalization with supervision assistance from wife using educational materials provided Outcome: Progressing

## 2024-07-28 NOTE — Patient Care Conference (Signed)
 Inpatient RehabilitationTeam Conference and Plan of Care Update Date: 07/28/2024   Time: 1009 am   Patient Name: Kristopher Thompson      Medical Record Number: [id number]  Date of Birth: September 24, 1951 Sex: Male         Room/Bed: 4W26C/4W26C-01 Payor Info: Payor: MEDICARE / Plan: MEDICARE PART A AND B / Product Type: *No Product type* /    Admit Date/Time:  07/11/2024 12:40 PM  Primary Diagnosis:  Critical illness myopathy  Hospital Problems: Principal Problem:   Critical illness myopathy Active Problems:   Cognitive and behavioral changes   Malnutrition of moderate degree    Expected Discharge Date: Expected Discharge Date: 08/07/24  Team Members Present: Physician leading conference: Dr. 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 Limited by hypotension and worsening cognitive deficits.   Goals at (S) to CGA- need to discuss goals of care with wife to determine next steps   ADLs, transfers, conditioning    Mobility   maxA bed mobility, maxA +2 sit to stand, MaxA +2 transfers, maxA +2 gait x50' with RW   Supervision, need to downgrade  global strengthening, bed mobility, transfers, ambulation    Communication                Safety/Cognition/ Behavioral Observations  mod-maxA - cognitive decline  since this weekend, delayed processing speed   supervision A (downgrade?)   pt/family education, cognitive retraining, completion of functional cognitive tasks    Pain   patient complaining of pain in feet, back and all over per wife   patient is pain free   assess pain q shift and PRN    Skin   Midline abdominal incision (pink)-scant serosanguineous drainage. Right lateral abdomen(previous JP drain site)-(wound is yellow and pink)-small amount of serosanguineous drainage. Left and right nephrostomy tubes(skin pink around nephrostomy sites)- scant amount of purulent drainage coming from around left and right nephrostomy tubes. RUQ ileostomy(pink around site). RLQ-urostomy(skin pink around site)- scant amount of purulent drainage coming from site. Left and Right buttock-(pink)skin intact.   keep skin from further breakdown  assess q shift and PRN      Discharge Planning:  D/c to home with wife who is primary caregiver. Pt will d/c on IVabx until 10/2. [facility] in place from previous admission. SW will confirm there are no barriers to discharge.    Team Discussion: Patient admitted post sepsis due to critical illness myopathy. Patient  with no functional progress , declining  with therapy. Patient with poor appetite/nausea/ hypotension/ worsening cognition/ UTI: medication adjusted by MD.   Patient on target to meet rehab goals: no, Patient needs max to total assistance with ADLs, transfers and mobility.  Patient's motivation declining and sitting balance is getting worse. Patient remains on dysphagia diet with  poor intake. Patient needs mod- max assistance with cognition, processing. Overall goals at discharge are set for supervision assistance.  *See Care Plan and progress notes for long and short-term goals.   Revisions to Treatment Plan: one per therapy. Calorie count  IV fluids Nephrology consult Delirium precautions Sleep chart IV antibiotics PICC line Downgraded  goals Caregiver education  Teaching Needs: Safety, medications, incision care education, tubes care education, transfers, dietary modifications , etc   Current Barriers to Discharge: Decreased caregiver support, Home enviroment access/layout, IV antibiotics, Wound care, and Nutritional means  Possible Resolutions to Barriers: Family Education     Medical Summary Current Status: Debility, abdominal abscess receiving IV antibiotics, severe Orthostatic hypotension, persistent nausea, severe protein-calorie malnutrition, AKI on CKD, multiple wounds/drains, UTI, and encephalopathy  Barriers to Discharge: Infection/IV Antibiotics;Inadequate Nutritional Intake;Hypotension;Cardiac Complications;Behavior/Mood;Pending surgery/plan;Renal Insufficiency/Failure;Self-care education   Possible Resolutions to [employer] Focus: Delirium precautions/frequent reorientation, encourage PO intakes and re-address tube feed vs. GOC discussion, QTc prolongation on multiple medicaitons, continuous IVF for renal function   Continued Need for Acute Rehabilitation Level of Care: The patient requires daily medical management by a physician with specialized training in physical medicine and rehabilitation for the following reasons: Direction of a multidisciplinary physical rehabilitation program to maximize functional independence : Yes Medical management of patient stability for increased activity during participation in an intensive rehabilitation regime.: Yes Analysis of laboratory values and/or radiology reports with any subsequent need for medication adjustment and/or medical intervention. : Yes   I attest that I was present, lead the team conference, and concur with the assessment and plan of the team.   Lodie Waheed Gayo 07/28/2024, 1009 am

## 2024-07-28 NOTE — Progress Notes (Signed)
 Physical Therapy Session Note  Patient Details  Name: Kristopher Thompson MRN: [id number] Date of Birth: [birth year]  Today's Date: 07/28/2024 PT Individual Time: 0903-1000 PT Individual Time Calculation (min): 57 min   Today's Date: 07/28/2024 PT Individual Time: 1415-1440 PT Individual Time Calculation (min): 25 min   Short Term Goals: Week 2:  PT Short Term Goal 1 (Week 2): Pt will perform bed mobility with modA consistently. PT Short Term Goal 2 (Week 2): Pt will perform sit to stand with modA consistently. PT Short Term Goal 3 (Week 2): Pt will perform bed to chair with modA consistently. PT Short Term Goal 4 (Week 2): Pt will ambulates x50' with modA +2 and LRAD.  Skilled Therapeutic Interventions/Progress Updates:     1st Session: Pt received semi reclined in bed and agrees to therapy. Pt appears much more energetic and talkative this session, but speech is nonsensical at times and pt is only oriented to self. Pt performs supine to sit with maxA and cues for positioning at EOB. Pt's sitting balance and initiation appear better this session, as well, maintaining static sitting balance with CGA and cues for body mechanics. Pt performs stand step transfer to [facility] with maxA +2 and poor safety awareness, attempting to sit well before he is safely in front of WC< requiring guidance of hips to surface to prevent fall. WC transport to gym. Pt completes multiple reps of sit to stand in gym to work on coordination, transfers training, strengtheinng, and endurance training. Pt requires modA+2 to maxA+2 to stand with max cues for hand placement and body mechanics, as well as facilitation of anterior weight shifting and powering up. Pt able to stand 30 seconds to 1 minute each bout, requiring modA +2 to maintain static standing balance, with consistent posterior bias. Pt also requires constant cueing to maintain upright gaze to improve posture and balance. WC transport back to room. Left seated with all  needs within reach and alarm intact.   2nd Session: Pt received semi reclined in bed and agrees to therapy, though wife reports he had recently been helped back to bed due to low blood pressure. PT checks BP 82/51 but pt is asymptomatic and has thigh high ted hose in place. No complaint of pain. Pt performs supine to sit with maxA and cues for sequencing and positioning. Pt performs sit to stand with maxA +2 from elevated bed. During stand pivot transfer pt is noted to have urostomy drain leaking on gown and floor. PT assists pt back to seated position and he returns to supine with totalA +2 for urgency. PT assists with bilateral rolling to doff gown, with cues for body mechanics and logrolling technique. Pt left supine with RN and pt's wife prseent.   Therapy Documentation Precautions:  Precautions Precautions: Fall (HOH, Urostomy, Colostomy, PCN  drain, and orthostatic hypotension.) Recall of Precautions/Restrictions: Impaired Precaution/Restrictions Comments: urostomy, colostomy, lt PCN drain, watch BP and HR Restrictions Weight Bearing Restrictions Per Provider Order: No  Therapy/Group: Individual Therapy  Elsie JAYSON Dawn, PT, DPT 07/28/2024, 4:28 PM

## 2024-07-28 NOTE — Progress Notes (Signed)
 Patient ID: Kristopher Thompson, male   DOB: Sep 21, 1951, 73 y.o.   MRN: [id number]  SW met with pt, pt wife, pt son and DIL and granddtr in room to provide updates from team conference, and informed being unsure if d/c date will remain on 9/26 due to various medical concerns. SW will continue to provide updates.   Graeme Jude, MSW, LCSW Office: [phone] Cell: [phone] Fax: [phone]

## 2024-07-28 NOTE — Progress Notes (Signed)
 Occupational Therapy Weekly Progress Note  Patient Details  Name: Kristopher Thompson MRN: [id number] Date of Birth: [birth year]  Beginning of progress report period: 07/20/24 End of progress report period: 07/28/24   Patient has met 0 of 4 short term goals. [name] has had a difficult week with multiple medical issues interfering with therapy participation. He is more confused and limited by ongoing hypotension that limits OOB mobility. Unfortunately this has only worsened his severe global weakness and deconditioning. As of the last few days he is requiring max-total A for all ADLs and transfers and is a very high burden of care for his wife who is supportive and always present. Pending medical stability, goals of care need to be discussed over the next few days to determine home vs SNF d/c.   Patient continues to demonstrate the following deficits: muscle weakness, decreased cardiorespiratoy endurance, decreased coordination, decreased initiation, decreased attention, decreased awareness, decreased problem solving, decreased safety awareness, decreased memory, and delayed processing, and decreased sitting balance, decreased standing balance, decreased postural control, and decreased balance strategies and therefore will continue to benefit from skilled OT intervention to enhance overall performance with BADL and Reduce care partner burden.  Patient not progressing toward long term goals.  See goal revision..  Goals downgraded to min A, will need to update again pending GOC discussion with wife.   OT Short Term Goals Week 2:  OT Short Term Goal 1 (Week 2): Pt will stand from w/c with mod A OT Short Term Goal 1 - Progress (Week 2): Not met OT Short Term Goal 2 (Week 2): Pt will complete LB dressing with mod A OT Short Term Goal 2 - Progress (Week 2): Not met OT Short Term Goal 3 (Week 2): Pt will complete ADL routine with no more than 2 rest breaks OT Short Term Goal 3 - Progress (Week 2): Not  met OT Short Term Goal 4 (Week 2): Pt will complete stand pivot transfer to w/c with mod A using LRAD OT Short Term Goal 4 - Progress (Week 2): Not met Week 3:  OT Short Term Goal 1 (Week 3): Pt will maintain static sitting balance with min A OT Short Term Goal 2 (Week 3): Pt will complete sit > stand from elevated surface with mod A OT Short Term Goal 3 (Week 3): Pt will complete bed mobility rolling R and L with min A to reduce burden of care for wife  [name] 07/28/2024, 8:09 AM

## 2024-07-28 NOTE — Progress Notes (Signed)
 Speech Language Pathology Daily Session Note  Patient Details  Name: Kristopher Thompson MRN: [id number] Date of Birth: 08-Apr-1951  Today's Date: 07/28/2024 SLP Individual Time: 0101-0146 SLP Individual Time Calculation (min): 45 min  Short Term Goals: Week 3: SLP Short Term Goal 1 (Week 3): Patient will demonstrate basic problem solving skills with max multimodal A SLP Short Term Goal 2 (Week 3): Patient will recall daily information with supervision verbal A SLP Short Term Goal 3 (Week 3): Patient will express wants/needs given min multimodal A SLP Short Term Goal 4 (Week 3): Patient will consume least restrictive diet with mod multimodal A for use of compensatory strategies SLP Short Term Goal 5 (Week 3): Patient will participate in pharyngeal strengthening exercises  Skilled Therapeutic Interventions:  Patient was seen in PM to address cognitive re- training and dysphagia management. Pt was alert and seated upright in bed upon SLP arrival. Multiple family members present and participating intermittently throughout session. Pt responded very good when asked about how he was feeling. Pt not initially oriented to time or place and requiring multiple instances of re-teaching before recalling month and location x2 given min A at conclusion of session. Pt also challenged in recall of daily events including meals and participation in PT and OT sessions. Pt initially warranting total A with cues able to be faded to mod A for recall of meals, current session, and upcoming session. She warranted max A for recall of therapy tasks, SLP guiding pt in completion of pharyngeal strengthening exercises during session. He completed 1 set of 10 effortful swallows with additional time and 1 set of 5 CTARs given max A. At conclusion of session pt was left in bed with call button within reach and family present. SLP to continue POC.   Pain Pain Assessment Pain Scale: 0-10 Pain Score: 0-No pain  Therapy/Group:  Individual Therapy  [name] 07/28/2024, 1:48 PM

## 2024-07-28 NOTE — Progress Notes (Signed)
 PROGRESS NOTE   Subjective/Complaints:  Yesterday, worsening cognition and bps 70s; today BPS 100s / 50s laying down. Other vitals stable. Got Percocet 2 tabs at 1120 pm, RR called at 1730.  Tmax 99; no clinical fevers, other vitals stable CT head overnight negative for acute abnormalities. UA remains positive for infection - c/w prior UAs. Troponins negative. Labs this AM significant for albumin  2.0; mag repleted, BUN/Cr stabilizing/improving, Na normalized, magnesium  and K repleted.  PO intakes recorded as 0% yesterday; wife says he drank a [employer] shake last night, ate 100% breakfast this AM. Calorie count finishing today.   Minimal ileostomy output 150-200 ccs daily last 2 days recorded; per wife maybe another 200 outside of that. Urostomy OP appropriate.  ROS: Patient denies fever, rash, sore throat, blurred vision, dizziness,  , vomiting, diarrhea, cough, shortness of breath or chest pain, joint or back/neck pain, headache, or mood change.   Objective:   CT HEAD WO CONTRAST ( ) Result Date: 07/28/2024 CLINICAL DATA:  confusion EXAM: CT HEAD WITHOUT CONTRAST TECHNIQUE: Contiguous axial images were obtained from the base of the skull through the vertex without intravenous contrast. RADIATION DOSE REDUCTION: This exam was performed according to the departmental dose-optimization program which includes automated exposure control, adjustment of the mA and/or kV according to patient size and/or use of iterative reconstruction technique. COMPARISON:  CT head April 16, 2024. FINDINGS: Brain: No evidence of acute infarction, hemorrhage, hydrocephalus, extra-axial collection or mass lesion/mass effect. Vascular: No hyperdense vessel or unexpected calcification. Skull: No acute fracture. Sinuses/Orbits: Clear sinuses.  No acute orbital findings. Other: No mastoid effusions. IMPRESSION: No evidence of acute intracranial abnormality.  Electronically Signed   By: Gilmore GORMAN Molt M.D.   On: 07/28/2024 03:47   DG CHEST PORT 1 VIEW Result Date: 07/27/2024 CLINICAL DATA:  Shortness of breath EXAM: PORTABLE CHEST 1 VIEW COMPARISON:  07/01/2024 FINDINGS: Cardiac shadow is stable. Right PICC is noted in satisfactory position. The lungs are clear. No bony abnormality is noted. IMPRESSION: No active disease. Electronically Signed   By: Oneil Devonshire M.D.   On: 07/27/2024 20:32   US  EKG SITE RITE Result Date: 07/26/2024 If Site Rite image not attached, placement could not be confirmed due to current cardiac rhythm.   Recent Labs    07/25/24 1318 07/27/24 0229  WBC 6.9 7.1  HGB 10.9* 10.6*  HCT 32.7* 32.9*  PLT 285 215    Recent Labs    07/27/24 0229 07/28/24 0250  NA 136  133* 138  K 3.3*  3.2* 3.5  CL 97*  94* 102  CO2 27  29 25   GLUCOSE 91  92 102*  BUN 43*  44* 38*  CREATININE 1.98*  1.98* 2.01*  CALCIUM  8.8*  8.8* 8.6*    Intake/Output Summary (Last 24 hours) at 07/28/2024 0942 Last data filed at 07/28/2024 0805 Gross per 24 hour  Intake 2389.22 ml  Output 2355 ml  Net 34.22 ml        Physical Exam: Vital Signs Blood pressure 100/63, pulse 79, temperature 98 F (36.7 C), resp. rate 17, height 5' 11 (1.803 m), weight 105 kg, SpO2 98%.   Constitutional: No distress .  Vital signs reviewed. Sitting up in WC. Pale.  HEENT: NCAT, EOMI, oral membranes moist Neck: supple Cardiovascular: RRR without murmur. No JVD    Respiratory/Chest: CTA Bilaterally without wheezes or rales. Normal effort    GI/Abdomen: BS +, non-tender, non-distended. Ostomy with liquid brown stool Ext: no clubbing, cyanosis, or edema Psych: pleasant and cooperative  Skin: abdominal wound continues to close with pink with granulation tissue, moist. Nephrostomy tube with bag seal in place. R flank prior tube site continues to drain. L tube with stable output clear yellow. Urostomy with some sediment.   Neuro:  alert but slow  processing. Follows basic commands with extra time.  [name] and [name] with cues; not oriented to time or situation. Poor attention.  Normal language and speech.  Cranial nerve exam unremarkable.  MMT: BUE 4/5 prox to distal. BLE 3-/5 HF, 4-KE and 4/5 ADF/PF. Inconsistent effort No limb ataxia or cerebellar signs.  Bilateral upper and lower extremity intention tremors--ongoing. No abnormal tone appreciated.   Musculoskeletal: Able to straighten both knees out on bed.  Plantarflexion tone bilateral lower extremities, barely able to range to neutral.   Assessment/Plan: 1. Functional deficits which require 3+ hours per day of interdisciplinary therapy in a comprehensive inpatient rehab setting. Physiatrist is providing close team supervision and 24 hour management of active medical problems listed below. Physiatrist and rehab team continue to assess barriers to discharge/monitor patient progress toward functional and medical goals  Care Tool:  Bathing    Body parts bathed by patient: Chest, Face, Abdomen, Right arm, Left arm, Front perineal area, Right upper leg, Left upper leg   Body parts bathed by helper: Buttocks, Right lower leg, Left lower leg     Bathing assist Assist Level: Moderate Assistance - Patient 50 - 74%     Upper Body Dressing/Undressing Upper body dressing   What is the patient wearing?: Hospital gown only    Upper body assist Assist Level: Moderate Assistance - Patient 50 - 74%    Lower Body Dressing/Undressing Lower body dressing      What is the patient wearing?: Pants     Lower body assist Assist for lower body dressing: Maximal Assistance - Patient 25 - 49%     Toileting Toileting    Toileting assist Assist for toileting:  (patient has colosteomy and urostomy)     Transfers Chair/bed transfer  Transfers assist  Chair/bed transfer activity did not occur: Safety/medical concerns  Chair/bed transfer assist level: Maximal  Assistance - Patient 25 - 49%     Locomotion Ambulation   Ambulation assist   Ambulation activity did not occur: Safety/medical concerns (fatigue/dizziness/orthostatics)          Walk 10 feet activity   Assist  Walk 10 feet activity did not occur: Safety/medical concerns (fatigue/dizziness/orthostatics)        Walk 50 feet activity   Assist Walk 50 feet with 2 turns activity did not occur: Safety/medical concerns (fatigue/dizziness/orthostatics)         Walk 150 feet activity   Assist Walk 150 feet activity did not occur: Safety/medical concerns (fatigue/dizziness/orthostatics)         Walk 10 feet on uneven surface  activity   Assist Walk 10 feet on uneven surfaces activity did not occur: Safety/medical concerns (fatigue/dizziness/orthostatics)         Wheelchair     Assist Is the patient using a wheelchair?: Yes Type of Wheelchair: Manual  Wheelchair 50 feet with 2 turns activity    Assist    Wheelchair 50 feet with 2 turns activity did not occur: Safety/medical concerns       Wheelchair 150 feet activity     Assist  Wheelchair 150 feet activity did not occur: Safety/medical concerns       Blood pressure 100/63, pulse 79, temperature 98 F (36.7 C), resp. rate 17, height 5' 11 (1.803 m), weight 105 kg, SpO2 98%. Medical Problem List and Plan: 1. 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  Undergoing medical workups as below, will monitor improvement for the next couple of days --address at teams if current level of care exceeds ability of home caregivers.  - 9-13: Bilateral lower extremity PRAFO's in bed for plantarflexion/tight heel  cords  --Continue CIR therapies including PT, OT, and SLP. Progress has been very slow. May need to consider other discharge venue   - 9/16: Declining with therapies; pleasantly confused today, interactions much less withdrawn but nonsensical. Will need Mod-Max A at home. Concern for physical decline exceeding family ability for caregiving if no improvements moving forward.   - Waiting on calorie counts today to determine if feeding tube needed; if patient/family continue to refuse, would recommend goals of care discussion and possible palliative care consult.    2.  H/o PE/Antithrombotics: -DVT/anticoagulation:  Pharmaceutical: Eliquis              -antiplatelet therapy: N/A  - No further blood appreciated in nephrostomy tubes  3. Pain Management: Oxycodone  prn.    - 9/16: Oxycodone  was removed yesterday due to concerns of this contributing to PM confusion and hypotension; nursing reports family in disagreement with this and patient pain increased. Adjust to Tylenol  PRN for mild to moderate, Percocet 5 mg for severe pain to avoid undertreatment. Monitor BP VERY closely  4. Mood/Behavior/Sleep: LCSW to follow for evaluation and support.              --melatonin for insomnia.              -antipsychotic agents: N/A  9-14: Per patient, sleeping well.  Sleep log.  Discussed delirium management with patient's wife  5. Neuropsych/cognition/encephalopathy likely secondary to metabolic acidosis vs delirium from prolonged illness/hospitalization: This patient is not capable of making decisions on his own behalf.  - 9-11: Increasingly withdrawn, behavioral changes may be secondary to poor nutritional intake and hyponatremia.  Treating as below, on sertraline  100 mg daily, may need to add additional agent such as mirtazapine but hesitant given ongoing cognitive issues.  9-12: Slightly better today, but consistent with hypoactive delirium with primary difficulty with concentration, no focal neurologic  deficits.  Start overnight sleep log, delirium precautions.  Suspect hyponatremia as primary contributor, management as below.  9/13: D3C Flexeril , Megace , Zoloft  due to ongoing cognitive changes.  Sodium is improving.  Get LFTs, ammonia today to evaluate ongoing cognitive deficits--within normal limits.  -14: Cognition seems to be clearing a bit, continue current regimen.   9/15 alert but delayed. Continue to maximize renal/volume/nutritional status 9/16: Much more alert, but delirious; sleep log appears appropriate. Will add Zyprexa  5 mg PRN for aggitation d/t increased  aggitation yesterday afternoon. Keep low dose / low likelihood of Qtc prolongation medications only.    6. Skin/Wound Care: Routine pressure relief measures. Encourage adequate diet             --dressing changes daily. Gerhardt's cream to buttock wound.   -abdominal wound continues to close despite tenous nutritional status 7. Fluids/Electrolytes/Nutrition/oropharyngeal dysphagia/moderate to severe protein calorie malnutrition:                --9/10 periactin  yesterday stopped d/t lethargy   -will try megace  beginning today   -RD f/u   -push fluids   -recheck bmet in am   -continue juven bid. Has been refusing Ensure supplement.   -kdur 20meq daily   -can't use zofran  d/t QTc prolongation (490)  - MBS   9/3--D3/thins, prealbumin borderline normal at 20   - 9-11: Nausea remains a primary barrier to adequate p.o. intakes.  Recheck EKG today, QTc was 460, scheduled Zofran  4 mg IV 3 times daily AC.  Will repeat EKG in next 1 to 2 days.  - 9-12: Albumin  infusion per nephrology.  P.o. intakes improving with Zofran .  per IV team midline contraindicated due to bicarb.  May get PICC line once dialysis no longer considered.  9-14: P.o. intakes okay, Zofran  continues to help with nausea.  Per nephrology, okay to reduce fluids, will keep on 70 cc/h normal saline for maintenance for now.  PICC line order placed.  9/15 intake 20-30%.  Receiving mtc IVF.    -k+ 3.3, mg++ 1.3   -continue kdur 20meq daily and add 40meq to IVF   -IV mag sulfae 2g x1, add mag gluconate oral daily    9/16: Mag, K repleted. PO intakes remain very poor. Albumin  2.0. Continue maintenance fluids--will likely need TF.   8. Sepsis/Abdominal wall/mesh infection: IV Vanc/Ceftriaxone  for 6 weeks from 08/21             --Probable 6 months total of antibiotics--ID follow up 09/18 @10  am --Corynebacterium species-->6 more weeks ?Dalbavancin for OP use, cont ceftriazone and linezolid  for now  per ID -appreciate recent ID f/u CRP nl 0.8 ESR 44 WBC 8.2 9-11: WBC labs remained stable. 9/15 wbc 7.1 9/16: Discussed with Dr. Dennise ID possibility of encephalopathy with current antibiotics, as well as +UA yesterday. Recommendation to continue current broad spectrum antibiotics as they are unlikely contributors and regimen has already been altered from vancomycin . Given some yeast in UA will await culture to see if secondary fungal infection present vs contaminant.   9. Left> right hydronephrosis: Nephrostomy tubes with stent exchanged on 08/27 --continue clamping right tube and left to drain per urology.  Monitor for blood recheck CBC in am --hemoglobin stable, no further bloody output appreciated -9/15 flush left nephrostomy tube  10. Acute on chronic renal failure/hyponatremia:  elevated CR- baseline creat probably ~1.4  -9/10 will give addnl IVF today d/t poor po intake   -recheck bmet in am  - 9-11: AKI significantly worse, now with hyponatremia 131.  Will give 1 L fluid bolus normal saline today, recheck BMP; given brisk uptrend despite increase in IV fluids will consult nephrology for further recommendations.  - 9-12: Appreciate nephrology input and recommendations.  Responding slightly to increased IV fluids overnight, normal saline 125/h.  Hyponatremia remains 131. On multiple medications that could be contributing including Florinef , sertraline , and  linezolid .  See their note for specific recommendations; advised to remain on fluids for primary treatment of hyponatremia, do not recommend salt tabs  at this time.  - If renal function does not improve with fluids may need CT scan of the abdomen and pelvis to verify patency of nephrostomy/ileal conduit drainage   9-13: Potassium chloride  packet 40 mill equivalents ordered this a.m. for repletion.  Sodium improving, bicarb improving, but renal function remains unchanged on bicarb drip.  9-14: Renal function improved, acidosis resolved, sodium 134.  Per nephrology, can DC fluids, but agreeable to maintenance fluids given ongoing poor p.o. intakes.  Continue to monitor closely.  9/15-16 Cr continues to improve gradually, continue IVF, push po  11. OSA: Encourage CPAP use 12. Hypotension/tachycardia: Continue Midodrine  15 mg TID and Florinef  2 mg daily             --continues to have significant tachycardia with activity likely due to orthostasis and deconditioning.  - -hgb 11.5   -continue metoprolol  low dose 12.5mg  bid, bp remains soft but stable at present   -  IVF as above Per PT- has been not dropping in therapy   9-13: Orthostatic today, DC Lopressor  and increase midodrine  to 20 mg 3 times daily.  Has been getting a significant amount of IV fluid last few days, still likely volume depleted per nephrology.  9-14: Blood pressure remains stable, but soft. 9/15 hypotensive this morning.  Continue IVF and plan as above 9/16: A bit improved today; continue IVF, no signs of volume overload. If profoundly hypotensive again may need to transfer to acute given maximizing fluids/BP support without pressors.   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    9/1 held reglan   -9/16: resume reglan  given low OP, nausea  14. Depression: On Zoloft -- Dced 9/13 due to hyponatremia and confusion   - 9/16: Family concerned for serotonin syndrome with zoloft +linezolid - no tachycardia, fevers, seizures,  or severe agitation reported - encephalopathy has not improved with removal of zoloft . Low suspicion at this time.   15. Malnutrition: Albumin  @1 .9/TP-5.1.  --> 2.2 9/11;  - 9-11: Adding Premeal Zofran  as above.  Will get nutrition to start calorie count; may need additional supplementation or discussion of feeding tube if does not improve.  - 9-12: Slightly improved, continue this regimen.  9-13: Very poor p.o. intakes yesterday.  DC Megace  due to possible cognitive effects, no perceived benefit.  Will discuss possible feeding tube with family--patient rejects, will get calorie count for 48 hours and if no improvement may readdress tube feed versus palliative consult  9-14: Wife encouraging p.o.'s, patient motivated to avoid feeding tube.  Calorie count started.  Maintenance fluids as above.  9/16: Qtc 519; DC zofran , resume relgan 5 mg TID AC as he did well with this in the past and ostomy output has slowed considerably    16. Anemia of chronic illness: Stable overall 9/8-11  - Hemoglobin stable 9-15       Latest Ref Rng & Units 07/27/2024    2:29 AM 07/25/2024    1:18 PM 07/23/2024    4:30 AM  CBC  WBC 4.0 - 10.5 K/uL 7.1  6.9  8.5   Hemoglobin 13.0 - 17.0 g/dL 89.3  89.0  88.6   Hematocrit 39.0 - 52.0 % 32.9  32.7  34.7   Platelets 150 - 400 K/uL 215  285  351          LOS: 17 days A FACE TO FACE EVALUATION WAS PERFORMED  Kristopher Thompson 07/28/2024, 9:42 AM

## 2024-07-28 NOTE — Progress Notes (Addendum)
 Occupational Therapy Session Note  Patient Details  Name: Kristopher Thompson MRN: [id number] Date of Birth: [birth year]  Today's Date: 07/28/2024 OT Individual Time: 1103-1200 OT Individual Time Calculation (min): 57 min    Short Term Goals: Week 3:  OT Short Term Goal 1 (Week 3): Pt will maintain static sitting balance with min A OT Short Term Goal 2 (Week 3): Pt will complete sit > stand from elevated surface with mod A OT Short Term Goal 3 (Week 3): Pt will complete bed mobility rolling R and L with min A to reduce burden of care for wife  Skilled Therapeutic Interventions/Progress Updates:    Pt received resting in [facility] presenting to be in good spirits receptive to skilled OT session reporting 0/10 pain- OT offering intermittent rest breaks, repositioning, and therapeutic support to optimize participation in therapy session. Completed vital check and BP 98/61 O2 100. Transported Pt to rehab gym via [facility] d/t time management. Pt completed 10 bicep curls requiring MAX verbal cues for attention to task for UE strengthening to address fatiguing during ADLs. Pt completed 3 stands for 20 secs with MAX A +2 for BLE weight bearing and increased activity tolerance. Pt engaged in 10 mins of ergometer with MOD verbal cues for attention for endurance training BUE. Provided education on importance of nutrition and provided therapeutic listening regarding future decisions on goals of rehab. Provided extended rest times d/t fatigue and medical status. Took final vitals BP 98/63 O2 100. Observed skin tear, placed foam pad on L forearm. Pt was left resting in WC slightly tilted back with call bell in reach and all needs met.    Therapy Documentation Precautions:  Precautions Precautions: Fall (HOH, Urostomy, Colostomy, PCN  drain, and orthostatic hypotension.) Recall of Precautions/Restrictions: Impaired Precaution/Restrictions Comments: urostomy, colostomy, lt PCN drain, watch BP and HR Restrictions Weight  Bearing Restrictions Per Provider Order: No  Therapy/Group: Individual Therapy  Casey Fye Van Schaick 07/28/2024, 12:44 PM

## 2024-07-29 LAB — URINE CULTURE: Culture: >=100000 — AB

## 2024-07-29 LAB — RENAL FUNCTION PANEL
Albumin: 1.9 g/dL — ABNORMAL LOW (ref 3.5–5.0)
Anion gap: 11 (ref 5–15)
BUN: 32 mg/dL — ABNORMAL HIGH (ref 8–23)
CO2: 23 mmol/L (ref 22–32)
Calcium: 8.5 mg/dL — ABNORMAL LOW (ref 8.9–10.3)
Chloride: 104 mmol/L (ref 98–111)
Creatinine, Ser: 1.78 mg/dL — ABNORMAL HIGH (ref 0.61–1.24)
GFR, Estimated: 40 mL/min — ABNORMAL LOW (ref >60)
Glucose, Bld: 88 mg/dL (ref 70–99)
Phosphorus: 1.9 mg/dL — ABNORMAL LOW (ref 2.5–4.6)
Potassium: 3.3 mmol/L — ABNORMAL LOW (ref 3.5–5.1)
Sodium: 138 mmol/L (ref 135–145)

## 2024-07-29 LAB — GLUCOSE, CAPILLARY
Glucose-Capillary: 91 mg/dL (ref 70–99)
Glucose-Capillary: 115 mg/dL — ABNORMAL HIGH (ref 70–99)

## 2024-07-29 LAB — PHOSPHORUS: Phosphorus: 1.3 mg/dL — ABNORMAL LOW (ref 2.5–4.6)

## 2024-07-29 LAB — MAGNESIUM: Magnesium: 1.7 mg/dL (ref 1.7–2.4)

## 2024-07-29 MED ORDER — THIAMINE MONONITRATE 100 MG PO TABS
100.0000 mg | ORAL_TABLET | Freq: Every day | ORAL | Status: AC
  Administered 2024-07-29 – 2024-08-04 (×7): 100 mg
  Filled 2024-07-29 (×7): qty 1

## 2024-07-29 MED ORDER — JUVEN PO PACK
1.0000 | PACK | Freq: Two times a day (BID) | ORAL | Status: DC
  Administered 2024-07-29 – 2024-08-05 (×12): 1
  Filled 2024-07-29 (×13): qty 1

## 2024-07-29 MED ORDER — PROSOURCE TF20 ENFIT COMPATIBL EN LIQD
60.0000 mL | Freq: Every day | ENTERAL | Status: DC
  Administered 2024-07-29 – 2024-07-30 (×2): 60 mL
  Filled 2024-07-29 (×3): qty 60

## 2024-07-29 MED ORDER — VITAL 1.5 CAL PO LIQD
1000.0000 mL | ORAL | Status: DC
  Administered 2024-07-29 – 2024-07-31 (×2): 1000 mL
  Filled 2024-07-29 (×4): qty 1000

## 2024-07-29 MED ORDER — POTASSIUM & SODIUM PHOSPHATES 280-160-250 MG PO PACK
1.0000 | PACK | Freq: Three times a day (TID) | ORAL | Status: DC
  Administered 2024-07-29 (×2): 1 via ORAL
  Filled 2024-07-29 (×3): qty 1

## 2024-07-29 MED ORDER — ALPRAZOLAM 0.25 MG PO TABS
0.2500 mg | ORAL_TABLET | Freq: Once | ORAL | Status: AC | PRN
  Administered 2024-07-29: 0.25 mg via ORAL
  Filled 2024-07-29: qty 1

## 2024-07-29 MED ORDER — ALPRAZOLAM 0.5 MG PO TABS: 0.5000 mg | ORAL_TABLET | Freq: Once | ORAL | Status: DC | PRN

## 2024-07-29 MED ORDER — SODIUM PHOSPHATES 45 MMOLE/15ML IV SOLN
15.0000 mmol | Freq: Once | INTRAVENOUS | Status: AC
  Administered 2024-07-29: 15 mmol via INTRAVENOUS
  Filled 2024-07-29: qty 5

## 2024-07-29 NOTE — Progress Notes (Signed)
 Physical Therapy Session Note  Patient Details  Name: Kristopher Thompson MRN: [id number] Date of Birth: 13-Jun-1951  Today's Date: 07/29/2024 PT Individual Time: 0800-0900 PT Individual Time Calculation (min): 60 min   Short Term Goals: Week 2:  PT Short Term Goal 1 (Week 2): Pt will perform bed mobility with modA consistently. PT Short Term Goal 2 (Week 2): Pt will perform sit to stand with modA consistently. PT Short Term Goal 3 (Week 2): Pt will perform bed to chair with modA consistently. PT Short Term Goal 4 (Week 2): Pt will ambulates x50' with modA +2 and LRAD.  Skilled Therapeutic Interventions/Progress Updates:   Pt received supine in bed, agreeable to therapy w/ minimal encouragement. Pt performed stand step transfer to TIS WC w/ maxA +2 to guide hips into chair and elevated bed surface. Pt required RW and increased time as well.   Pt wheeled total A to day room for session, pt presented w/ forward head posture, inability to rest head on headrest even when tilted backwards. Pt educated on vibration therapy, trialed sensation on L hand/forearm and pt agreeable to therapy. Vibration therapy performed on B scalenes, B SCMs to decrease cervical protraction, improve cervical ROM which improves visibility during gait, reducing fall risk and decreasing caregiver burden. Pt tolerated therapy well, indicating it felt good. Vibration on B SCMs felt weird but pt tolerated therapy. Pt able to rest full weight of head into headrest by end of session. Pt wheeled back to room w/ total A, given call bell and all other needs in reach. Pt wife educated on progress of session, all questions answered at this time.   Therapy Documentation Precautions:  Precautions Precautions: Fall (HOH, Urostomy, Colostomy, PCN  drain, and orthostatic hypotension.) Recall of Precautions/Restrictions: Impaired Precaution/Restrictions Comments: urostomy, colostomy, lt PCN drain, watch BP and HR Restrictions Weight  Bearing Restrictions Per Provider Order: No   Therapy/Group: Individual Therapy  [name] 07/29/2024, 12:23 PM

## 2024-07-29 NOTE — Progress Notes (Signed)
 Occupational Therapy Session Note  Patient Details  Name: Kristopher Thompson MRN: [id number] Date of Birth: 1951/08/27  Today's Date: 07/29/2024 OT Individual Time: 1020-1100 OT Individual Time Calculation (min): 40 min    Short Term Goals: Week 3:  OT Short Term Goal 1 (Week 3): Pt will maintain static sitting balance with min A OT Short Term Goal 2 (Week 3): Pt will complete sit > stand from elevated surface with mod A OT Short Term Goal 3 (Week 3): Pt will complete bed mobility rolling R and L with min A to reduce burden of care for wife  Skilled Therapeutic Interventions/Progress Updates:   Pt greeted sitting in TIS WC, no reports of pain. Spouse provides assistance for emptying of illeostomy bag. Pt dependently transported from room<>day room in TIS WC. In day room, pt completes x3 sit<>stands with Max A (x1), tolerating static stance for ~10-15 secs at a time with Mod-Max A to maintain forward weight-shift. Extended rest-breaks provided for management of SOB and monitoring of vitals. VSS during session. Pt then instructed in 1x10 reps of modified volley ball game, improved affect noted during activity, all for BUE strengthening/endurance with use of 1# dowel bar. Pt remained sitting in TIS WC with all immediate needs met.   Therapy Documentation Precautions:  Precautions Precautions: Fall (HOH, Urostomy, Colostomy, PCN  drain, and orthostatic hypotension.) Recall of Precautions/Restrictions: Impaired Precaution/Restrictions Comments: urostomy, colostomy, lt PCN drain, watch BP and HR Restrictions Weight Bearing Restrictions Per Provider Order: No   Therapy/Group: Individual Therapy  [name], OTR/L, MSOT  07/29/2024, 7:50 AM

## 2024-07-29 NOTE — Progress Notes (Signed)
 Occupational Therapy Session Note  Patient Details  Name: Kristopher Thompson MRN: [id number] Date of Birth: May 01, 1951  Today's Date: 07/29/2024 OT Individual Time: 1301-1400 OT Individual Time Calculation (min): 59 min    Short Term Goals: Week 3:  OT Short Term Goal 1 (Week 3): Pt will maintain static sitting balance with min A OT Short Term Goal 2 (Week 3): Pt will complete sit > stand from elevated surface with mod A OT Short Term Goal 3 (Week 3): Pt will complete bed mobility rolling R and L with min A to reduce burden of care for wife  Skilled Therapeutic Interventions/Progress Updates:    Pt received in the w/c with no c/o pain. NT Kambi assisted him in eating and he was able to eat half his lunch! He was agreeable to oral care at the sink, requiring min A to bend forward to spit in sink and set up assist of toothbrush. Patient required increased time for initiation, cuing, rest breaks, and for completion of tasks throughout session. Utilized therapeutic use of self throughout to promote efficiency. He was taken to the therapy gym. Checked BP and it was 98/75. Initiated transfer and pt was found to be wet d/t urostomy leaking. He was taken back to his room. He stood at the sink for UB bathing and gown change- mod A d/t fatigue and very poor activity tolerance. Max A +2 for sit > stand with BUE support on the RW. Pt only able to tolerate standing for 5 seconds.250 cc emptied from ileostomy. He then participated in gentle BUE shoulder raises with mod facilitation to bring arms to 80 degrees- for carryover to UE AROM/strengthening. Pt was left sitting up in the wheelchair with all needs met and call bell within reach.   Therapy Documentation Precautions:  Precautions Precautions: Fall (HOH, Urostomy, Colostomy, PCN  drain, and orthostatic hypotension.) Recall of Precautions/Restrictions: Impaired Precaution/Restrictions Comments: urostomy, colostomy, lt PCN drain, watch BP and  HR Restrictions Weight Bearing Restrictions Per Provider Order: No  Therapy/Group: Individual Therapy  [name] 07/29/2024, 1:13 PM

## 2024-07-29 NOTE — Progress Notes (Signed)
  [facility] NEPHROLOGY PROGRESS NOTE  Assessment/ Plan: Pt is a 73 y.o. 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  # Acute kidney injury on CKD IIIA: Likely hemodynamically mediated with hypotension and decreased oral intake.   - predisposed to pre-renal injury with ostomy - cortrak placed today follow response to feeds - The family and primary team has been concerned about very poor oral intake therefore needing maintenance IV fluid.   - see BMP is ordered for AM.  Repeat phos in AM  # Decreased responsiveness/AMS - improved - concurrent with BP and fluids  - s/p NS bolus 500 mL once on 9/15 and increase in rate of NS back to 75 ml/hr - note he has been on ceftriaxone  and linezolid    - abx per primary team  - note urine with yeast    # Hyponatremia: Sodium level improving with fluids.    # Metabolic acidosis, improved and now off of bicarb  # Sepsis due to abdominal wall infection on antibiotics per primary team.  # Hypokalemia: Continue to replete potassium chloride  - he is ordered for 20 meq potassium daily.  Hypomagnesemia which was repleted.   # Hypophosphatemia  - see was ordered potassium phos.  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  Disposition - Continue monitoring in rehab     Subjective:  He had 645 mL UOP recorded over 9/16 and another 600 mL UOP recorded today.  He has been on NS at 75 ml/hr.  Stool output has decreased.  He had a cortrak placed today for feeds   Review of systems:    Denies shortness of breath or chest pain Denies n/v   Objective Vital signs in last 24 hours: Vitals:   07/28/24 2302  07/29/24 0428 07/29/24 0700 07/29/24 1538  BP: 107/65 98/67  100/75  Pulse: 89 75  81  Resp: 17 18  18   Temp: 98.1 F (36.7 C) 97.9 F (36.6 C)  98.8 F (37.1 C)  TempSrc: Axillary Axillary  Axillary  SpO2: 99% 99%  100%  Weight:   105 kg   Height:       Weight change: 0 kg  Intake/Output Summary (Last 24 hours) at 07/29/2024 1835 Last data filed at 07/29/2024 1817 Gross per 24 hour  Intake 760 ml  Output 2175 ml  Net -1415 ml       Labs: RENAL PANEL Recent Labs  Lab 07/25/24 0609 07/25/24 1318 07/26/24 0529 07/27/24 0229 07/28/24 0250 07/29/24 0232 07/29/24 1524  NA 134*  --  134* 136  133* 138 138  --   K 3.3*  --  3.0* 3.3*  3.2* 3.5 3.3*  --   CL 102  --  97* 97*  94* 102 104  --   CO2 19*  --  26 27  29 25 23   --   GLUCOSE 88  --  82 91  92 102* 88  --   BUN 53*  --  49* 43*  44* 38* 32*  --   CREATININE 2.22*  --  2.14* 1.98*  1.98* 2.01* 1.78*  --   CALCIUM  9.9  --  9.4 8.8*  8.8* 8.6* 8.5*  --  MG  --   --   --  1.3* 1.9 1.7  --   PHOS 3.4  --  2.7 2.4* 2.5 1.9* 1.3*  ALBUMIN  2.8* 2.8* 2.5* 2.2* 2.0* 1.9*  --     Liver Function Tests: Recent Labs  Lab 07/23/24 0425 07/24/24 0609 07/25/24 1318 07/26/24 0529 07/27/24 0229 07/28/24 0250 07/29/24 0232  AST 17  --  15  --   --   --   --   ALT 13  --  13  --   --   --   --   ALKPHOS 81  --  73  --   --   --   --   BILITOT 0.4  --  0.4  --   --   --   --   PROT 5.5*  --  5.5*  --   --   --   --   ALBUMIN  2.2*   < > 2.8*   < > 2.2* 2.0* 1.9*   < > = values in this interval not displayed.   No results for input(s): LIPASE, AMYLASE in the last 168 hours. Recent Labs  Lab 07/25/24 1318  AMMONIA 21   CBC: Recent Labs    05/02/24 0515 05/04/24 0341 07/20/24 0506 07/22/24 0729 07/23/24 0430 07/25/24 1318 07/27/24 0229  HGB  --    < > 11.9* 11.9* 11.3* 10.9* 10.6*  MCV  --    < > 83.5 83.4 82.4 80.1 82.7  FERRITIN 588*  --   --   --   --   --   --   TIBC 281  --   --   --   --    --   --   IRON  54  --   --   --   --   --   --    < > = values in this interval not displayed.    Cardiac Enzymes: No results for input(s): CKTOTAL, CKMB, CKMBINDEX, TROPONINI in the last 168 hours. CBG: Recent Labs  Lab 07/27/24 1639 07/29/24 1620  GLUCAP 103* 91    Iron  Studies: No results for input(s): IRON , TIBC, TRANSFERRIN, FERRITIN in the last 72 hours. Studies/Results: CT HEAD WO CONTRAST ( ) Result Date: 07/28/2024 CLINICAL DATA:  confusion EXAM: CT HEAD WITHOUT CONTRAST TECHNIQUE: Contiguous axial images were obtained from the base of the skull through the vertex without intravenous contrast. RADIATION DOSE REDUCTION: This exam was performed according to the departmental dose-optimization program which includes automated exposure control, adjustment of the mA and/or kV according to patient size and/or use of iterative reconstruction technique. COMPARISON:  CT head April 16, 2024. FINDINGS: Brain: No evidence of acute infarction, hemorrhage, hydrocephalus, extra-axial collection or mass lesion/mass effect. Vascular: No hyperdense vessel or unexpected calcification. Skull: No acute fracture. Sinuses/Orbits: Clear sinuses.  No acute orbital findings. Other: No mastoid effusions. IMPRESSION: No evidence of acute intracranial abnormality. Electronically Signed   By: Gilmore GORMAN Molt M.D.   On: 07/28/2024 03:47    Medications: Infusions:  cefTRIAXone  (ROCEPHIN )  IV 2 g (07/28/24 2305)   feeding supplement (VITAL 1.5 CAL) 1,000 mL (07/29/24 1723)   linezolid  (ZYVOX ) IV 600 mg (07/29/24 0944)    Scheduled Medications:  apixaban   5 mg Oral BID   childrens multivitamin  1 tablet Oral BID   Chlorhexidine  Gluconate Cloth  6 each Topical Q12H   cyanocobalamin   500 mcg Oral Q M,W,F   feeding supplement (PROSource TF20)  60 mL Per Tube  Daily   fludrocortisone   0.2 mg Oral Daily   Gerhardt's butt cream  1 Application Topical BID   leptospermum manuka honey  1  Application Topical Daily   loperamide   4 mg Oral TID   magnesium  gluconate  500 mg Oral Daily   melatonin  3 mg Oral QHS   metoCLOPramide   5 mg Oral TID AC   midodrine   20 mg Oral TID WC   nutrition supplement (JUVEN)  1 packet Per Tube BID BM   omega-3 acid ethyl esters  1 g Oral QODAY   polycarbophil  625 mg Oral BID   potassium & sodium phosphates   1 packet Oral TID WC & HS   potassium chloride   20 mEq Oral Daily   rosuvastatin   10 mg Oral QPM   sodium chloride  flush  10-40 mL Intracatheter Q12H   thiamine   100 mg Per Tube Daily    have reviewed scheduled and prn medications.  Physical Exam:       General elderly male in bed in no acute distress HEENT normocephalic atraumatic extraocular movements intact sclera anicteric Neck supple trachea midline Lungs clear to auscultation bilaterally normal work of breathing at rest on room air Heart S1S2 no rub Abdomen soft nontender nondistended; left nephrostomy bag; ostomy in place Extremities no edema appreciated Psych blunted affect  Neuro awake and oriented to person, location of [facility]; states year is 1977    Kristopher Thompson 07/29/2024, 6:46 PM  LOS: 18 days

## 2024-07-29 NOTE — Progress Notes (Addendum)
 PROGRESS NOTE   Subjective/Complaints: Patient with no complaints today; per wife, back to his new baseline. AM labs significant for mild hypokalemia and hypophosphatemia; Cr still imnproving Urine Cx remains in process Vitals stable BP 90s/60s Ileostomy output and urostomy outputs appropriate Ate 100% breakfast and 85% lunch yesterday; took 1 boost and 1 ensure.  States that Reglan  is helping considerably with the nausea, although it is variable whether he gets this before meals or not.  Patient emotional but ultimately agreeable to trial of feeding tube to improve nutritional intake, allow for healing and increased energy, building a muscle to help with recovery.  Discussed with patient's wife considerable assistance need anticipated discharge, given current max assist requirements, even with nutritional optimization.  She agrees, has been looking into home nursing but would appreciate a specific discussion on expectations of his needs at home.  Agreeable to family meeting next week.  ROS: Patient denies fever, rash, sore throat, blurred vision, dizziness,  , vomiting, diarrhea, cough, shortness of breath or chest pain, joint or back/neck pain, headache, or mood change.   Objective:   CT HEAD WO CONTRAST ( ) Result Date: 07/28/2024 CLINICAL DATA:  confusion EXAM: CT HEAD WITHOUT CONTRAST TECHNIQUE: Contiguous axial images were obtained from the base of the skull through the vertex without intravenous contrast. RADIATION DOSE REDUCTION: This exam was performed according to the departmental dose-optimization program which includes automated exposure control, adjustment of the mA and/or kV according to patient size and/or use of iterative reconstruction technique. COMPARISON:  CT head April 16, 2024. FINDINGS: Brain: No evidence of acute infarction, hemorrhage, hydrocephalus, extra-axial collection or mass lesion/mass effect. Vascular: No  hyperdense vessel or unexpected calcification. Skull: No acute fracture. Sinuses/Orbits: Clear sinuses.  No acute orbital findings. Other: No mastoid effusions. IMPRESSION: No evidence of acute intracranial abnormality. Electronically Signed   By: Gilmore GORMAN Molt M.D.   On: 07/28/2024 03:47   DG CHEST PORT 1 VIEW Result Date: 07/27/2024 CLINICAL DATA:  Shortness of breath EXAM: PORTABLE CHEST 1 VIEW COMPARISON:  07/01/2024 FINDINGS: Cardiac shadow is stable. Right PICC is noted in satisfactory position. The lungs are clear. No bony abnormality is noted. IMPRESSION: No active disease. Electronically Signed   By: Oneil Devonshire M.D.   On: 07/27/2024 20:32    Recent Labs    07/27/24 0229  WBC 7.1  HGB 10.6*  HCT 32.9*  PLT 215    Recent Labs    07/28/24 0250 07/29/24 0232  NA 138 138  K 3.5 3.3*  CL 102 104  CO2 25 23  GLUCOSE 102* 88  BUN 38* 32*  CREATININE 2.01* 1.78*  CALCIUM  8.6* 8.5*    Intake/Output Summary (Last 24 hours) at 07/29/2024 0831 Last data filed at 07/29/2024 0704 Gross per 24 hour  Intake 1771.25 ml  Output 1570 ml  Net 201.25 ml        Physical Exam: Vital Signs Blood pressure 98/67, pulse 75, temperature 97.9 F (36.6 C), temperature source Axillary, resp. rate 18, height 5' 11 (1.803 m), weight 105 kg, SpO2 99%.   Constitutional: No distress . Vital signs reviewed. Sitting up in WC.  Pleasant, appropriate. HEENT: NCAT, EOMI, oral membranes  moist Neck: supple Cardiovascular: RRR without murmur. No JVD    Respiratory/Chest: CTA Bilaterally without wheezes or rales. Normal effort    GI/Abdomen: BS +, non-tender, non-distended. Ostomy with liquid brown stool--moderate amount Ext: no clubbing, cyanosis, or edema Psych: pleasant and cooperative  Skin: abdominal wound continues to close with pink with granulation tissue, moist. Nephrostomy tube with bag seal in place. R flank prior tube site continues to drain--some skin induration but no warmth. L  tube with stable output clear yellow. Urostomy with some sediment.   Neuro:  alert but slow processing. Follows basic commands with extra time.  [name] and [place] with cues; not oriented to time or situation.--Unchanged from yesterday Poor attention. --Slightly improved Normal language and speech.  Cranial nerve exam unremarkable.  MMT: BUE 4/5 prox to distal. BLE 3-/5 HF, 4-KE and 4/5 ADF/PF. Inconsistent effort No limb ataxia or cerebellar signs.  Bilateral upper and lower extremity intention tremors--ongoing. No abnormal tone appreciated.   Musculoskeletal: Able to straighten both knees out on bed.  Plantarflexion tone bilateral lower extremities, barely able to range to neutral.   Assessment/Plan: 1. Functional deficits which require 3+ hours per day of interdisciplinary therapy in a comprehensive inpatient rehab setting. Physiatrist is providing close team supervision and 24 hour management of active medical problems listed below. Physiatrist and rehab team continue to assess barriers to discharge/monitor patient progress toward functional and medical goals  Care Tool:  Bathing    Body parts bathed by patient: Chest, Face, Abdomen, Right arm, Left arm, Front perineal area, Right upper leg, Left upper leg   Body parts bathed by helper: Buttocks, Right lower leg, Left lower leg     Bathing assist Assist Level: Moderate Assistance - Patient 50 - 74%     Upper Body Dressing/Undressing Upper body dressing   What is the patient wearing?: Hospital gown only    Upper body assist Assist Level: Moderate Assistance - Patient 50 - 74%    Lower Body Dressing/Undressing Lower body dressing      What is the patient wearing?: Pants     Lower body assist Assist for lower body dressing: Maximal Assistance - Patient 25 - 49%     Toileting Toileting    Toileting assist Assist for toileting:  (patient has colosteomy and urostomy)     Transfers Chair/bed  transfer  Transfers assist  Chair/bed transfer activity did not occur: Safety/medical concerns  Chair/bed transfer assist level: Maximal Assistance - Patient 25 - 49%     Locomotion Ambulation   Ambulation assist   Ambulation activity did not occur: Safety/medical concerns (fatigue/dizziness/orthostatics)          Walk 10 feet activity   Assist  Walk 10 feet activity did not occur: Safety/medical concerns (fatigue/dizziness/orthostatics)        Walk 50 feet activity   Assist Walk 50 feet with 2 turns activity did not occur: Safety/medical concerns (fatigue/dizziness/orthostatics)         Walk 150 feet activity   Assist Walk 150 feet activity did not occur: Safety/medical concerns (fatigue/dizziness/orthostatics)         Walk 10 feet on uneven surface  activity   Assist Walk 10 feet on uneven surfaces activity did not occur: Safety/medical concerns (fatigue/dizziness/orthostatics)         Wheelchair     Assist Is the patient using a wheelchair?: Yes Type of Wheelchair: Manual           Wheelchair 50 feet with 2  turns activity    Assist    Wheelchair 50 feet with 2 turns activity did not occur: Safety/medical concerns       Wheelchair 150 feet activity     Assist  Wheelchair 150 feet activity did not occur: Safety/medical concerns       Blood pressure 98/67, pulse 75, temperature 97.9 F (36.6 C), temperature source Axillary, resp. rate 18, height 5' 11 (1.803 m), weight 105 kg, SpO2 99%. Medical Problem List and Plan: 1. 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  Undergoing medical workups as below, will monitor improvement for the next couple of days  --address at teams if current level of care exceeds ability of home caregivers.  - 9-13: Bilateral lower extremity PRAFO's in bed for plantarflexion/tight heel cords  --Continue CIR therapies including PT, OT, and SLP. Progress has been very slow. May need to consider other discharge venue   - 9/16: Declining with therapies; pleasantly confused today, interactions much less withdrawn but nonsensical. Will need Mod-Max A at home. Concern for physical decline exceeding family ability for caregiving if no improvements moving forward.   - Waiting on calorie counts today to determine if feeding tube needed; if patient/family continue to refuse, would recommend goals of care discussion and possible palliative care consult.    2.  H/o PE/Antithrombotics: -DVT/anticoagulation:  Pharmaceutical: Eliquis              -antiplatelet therapy: N/A  - No further blood appreciated in nephrostomy tubes  3. Pain Management: Oxycodone  prn.    - 9/16: Oxycodone  was removed yesterday due to concerns of this contributing to PM confusion and hypotension; nursing reports family in disagreement with this and patient pain increased. Adjust to Tylenol  PRN for mild to moderate, Percocet 5 mg for severe pain to avoid undertreatment. Monitor BP VERY closely  4. Mood/Behavior/Sleep: LCSW to follow for evaluation and support.              --melatonin for insomnia.              -antipsychotic agents: N/A  9-14: Per patient, sleeping well.  Sleep log.  Discussed delirium management with patient's wife  5. Neuropsych/cognition/encephalopathy likely secondary to metabolic acidosis vs delirium from prolonged illness/hospitalization: This patient is not capable of making decisions on his own behalf.  - 9-11: Increasingly withdrawn, behavioral changes may be secondary to poor nutritional intake and hyponatremia.  Treating as below, on sertraline  100 mg daily, may need to add additional agent such as mirtazapine but hesitant given  ongoing cognitive issues.  9-12: Slightly better today, but consistent with hypoactive delirium with primary difficulty with concentration, no focal neurologic deficits.  Start overnight sleep log, delirium precautions.  Suspect hyponatremia as primary contributor, management as below.  9/13: D3C Flexeril , Megace , Zoloft  due to ongoing cognitive changes.  Sodium is improving.  Get LFTs, ammonia today to evaluate ongoing cognitive deficits--within normal limits.  -14: Cognition seems to be clearing a bit, continue current regimen.   9/15 alert but delayed. Continue to maximize renal/volume/nutritional status 9/16: Much more alert, but delirious; sleep log appears appropriate. Will add Zyprexa  5 mg PRN for aggitation d/t increased aggitation yesterday  afternoon. Keep low dose / low likelihood of Qtc prolongation medications only.    6. Skin/Wound Care: Routine pressure relief measures. Encourage adequate diet             --dressing changes daily. Gerhardt's cream to buttock wound.   -abdominal wound continues to close despite tenous nutritional status 7. Fluids/Electrolytes/Nutrition/oropharyngeal dysphagia/moderate to severe protein calorie malnutrition:                --9/10 periactin  yesterday stopped d/t lethargy   -will try megace  beginning today   -RD f/u   -push fluids   -recheck bmet in am   -continue juven bid. Has been refusing Ensure supplement.   -kdur 20meq daily   -can't use zofran  d/t QTc prolongation (490)  - MBS   9/3--D3/thins, prealbumin borderline normal at 20   - 9-11: Nausea remains a primary barrier to adequate p.o. intakes.  Recheck EKG today, QTc was 460, scheduled Zofran  4 mg IV 3 times daily AC.  Will repeat EKG in next 1 to 2 days.  - 9-12: Albumin  infusion per nephrology.  P.o. intakes improving with Zofran .  per IV team midline contraindicated due to bicarb.  May get PICC line once dialysis no longer considered.  9-14: P.o. intakes okay, Zofran  continues to help  with nausea.  Per nephrology, okay to reduce fluids, will keep on 70 cc/h normal saline for maintenance for now.  PICC line order placed.  9/15 intake 20-30%. Receiving mtc IVF.    -k+ 3.3, mg++ 1.3   -continue kdur 20meq daily and add 40meq to IVF   -IV mag sulfae 2g x1, add mag gluconate oral daily    9/16: Mag, K repleted. PO intakes remain very poor. Albumin  2.0. Continue maintenance fluids--will likely need TF.   9/17: Ad phosnak 1 packet x3 doses for repletion of K and Phos; repeat in AM  8. Sepsis/Abdominal wall/mesh infection: IV Vanc/Ceftriaxone  for 6 weeks from 08/21             --Probable 6 months total of antibiotics--ID follow up 09/18 @10  am --Corynebacterium species-->6 more weeks ?Dalbavancin for OP use, cont ceftriazone and linezolid  for now  per ID -appreciate recent ID f/u CRP nl 0.8 ESR 44 WBC 8.2 9-11: WBC labs remained stable. 9/15 wbc 7.1 9/16: Discussed with Dr. Dennise ID possibility of encephalopathy with current antibiotics, as well as +UA yesterday. Recommendation to continue current broad spectrum antibiotics as they are unlikely contributors and regimen has already been altered from vancomycin . Given some yeast in UA will await culture to see if secondary fungal infection present vs contaminant. -pending 9/17  9. Left> right hydronephrosis: Nephrostomy tubes with stent exchanged on 08/27 --continue clamping right tube and left to drain per urology.  Monitor for blood recheck CBC in am --hemoglobin stable, no further bloody output appreciated -9/15 flush left nephrostomy tube  10. Acute on chronic renal failure/hyponatremia:  elevated CR- baseline creat probably ~1.4  -9/10 will give addnl IVF today d/t poor po intake   -recheck bmet in am  - 9-11: AKI significantly worse, now with hyponatremia 131.  Will give 1 L fluid bolus normal saline today, recheck BMP; given brisk uptrend despite increase in IV fluids will consult nephrology for further  recommendations.  - 9-12: Appreciate nephrology input and recommendations.  Responding slightly to increased IV fluids overnight, normal saline 125/h.  Hyponatremia remains 131. On multiple medications that could be contributing including Florinef , sertraline , and linezolid .  See their note for specific  recommendations; advised to remain on fluids for primary treatment of hyponatremia, do not recommend salt tabs at this time.  - If renal function does not improve with fluids may need CT scan of the abdomen and pelvis to verify patency of nephrostomy/ileal conduit drainage   9-13: Potassium chloride  packet 40 mill equivalents ordered this a.m. for repletion.  Sodium improving, bicarb improving, but renal function remains unchanged on bicarb drip.  9-14: Renal function improved, acidosis resolved, sodium 134.  Per nephrology, can DC fluids, but agreeable to maintenance fluids given ongoing poor p.o. intakes.  Continue to monitor closely.  9/15-17 Cr continues to improve gradually, continue IVF, push po    11. OSA: Encourage CPAP use 12. Hypotension/tachycardia: Continue Midodrine  15 mg TID and Florinef  2 mg daily             --continues to have significant tachycardia with activity likely due to orthostasis and deconditioning.  - -hgb 11.5   -continue metoprolol  low dose 12.5mg  bid, bp remains soft but stable at present   -  IVF as above Per PT- has been not dropping in therapy   9-13: Orthostatic today, DC Lopressor  and increase midodrine  to 20 mg 3 times daily.  Has been getting a significant amount of IV fluid last few days, still likely volume depleted per nephrology.  9-14: Blood pressure remains stable, but soft. 9/15 hypotensive this morning.  Continue IVF and plan as above 9/16: A bit improved today; continue IVF, no signs of volume overload. If profoundly hypotensive again may need to transfer to acute given maximizing fluids/BP support without pressors.   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    9/1 held reglan   -9/16: resume reglan  given low OP, nausea--with benefit, output remains low  14. Depression: On Zoloft -- Dced 9/13 due to hyponatremia and confusion   - 9/16: Family concerned for serotonin syndrome with zoloft +linezolid - no tachycardia, fevers, seizures, or severe agitation reported - encephalopathy has not improved with removal of zoloft . Low suspicion at this time.   15. Malnutrition: Albumin  @1 .9/TP-5.1.  --> 2.2 9/11;  - 9-11: Adding Premeal Zofran  as above.  Will get nutrition to start calorie count; may need additional supplementation or discussion of feeding tube if does not improve.  - 9-12: Slightly improved, continue this regimen.  9-13: Very poor p.o. intakes yesterday.  DC Megace  due to possible cognitive effects, no perceived benefit.  Will discuss possible feeding tube with family--patient rejects, will get calorie count for 48 hours and if no improvement may readdress tube feed versus palliative consult  9-14: Wife encouraging p.o.'s, patient motivated to avoid feeding tube.  Calorie count started.  Maintenance fluids as above.  9/16: Qtc 519; DC zofran , resume relgan 5 mg TID AC as he did well with this in the past and ostomy output has slowed considerably  9-17: QTc remains prolonged; will not resume Zofran  at this time.  Patient agreeable to placement of core track and discussed with nutrition starting with nocturnal tube feeds for supplementation, to allow appetite during the day to continue to work on increasing p.o.'s.  Will give one-time Xanax  to assist in anxiety with core track placement.    16. Anemia of chronic illness: Stable overall 9/8-11  - Hemoglobin stable 9-15       Latest Ref Rng & Units 07/27/2024    2:29 AM 07/25/2024    1:18 PM 07/23/2024    4:30 AM  CBC  WBC 4.0 - 10.5 K/uL 7.1  6.9  8.5   Hemoglobin 13.0 - 17.0 g/dL 89.3  89.0  88.6   Hematocrit 39.0 - 52.0 % 32.9  32.7  34.7   Platelets 150 - 400 K/uL 215  285   351          LOS: 18 days A FACE TO FACE EVALUATION WAS PERFORMED  Joesph JAYSON Likes 07/29/2024, 8:31 AM

## 2024-07-29 NOTE — Plan of Care (Signed)
  Problem: Consults Goal: RH GENERAL PATIENT EDUCATION Description: See Patient Education module for education specifics. Outcome: Progressing   Problem: RH BOWEL ELIMINATION Goal: RH STG MANAGE BOWEL WITH ASSISTANCE Description: STG Manage Bowel with supervision Assistance. Outcome: Progressing   Problem: RH BLADDER ELIMINATION Goal: RH STG MANAGE BLADDER WITH ASSISTANCE Description: STG Manage Bladder With supervision Assistance Outcome: Progressing   Problem: RH SKIN INTEGRITY Goal: RH STG SKIN FREE OF INFECTION/BREAKDOWN Description: Manage skin free of infection with supervision assistance Outcome: Progressing   Problem: RH SAFETY Goal: RH STG ADHERE TO SAFETY PRECAUTIONS W/ASSISTANCE/DEVICE Description: STG Adhere to Safety Precautions With Assistance/Device. Outcome: Progressing   Problem: RH PAIN MANAGEMENT Goal: RH STG PAIN MANAGED AT OR BELOW PT'S PAIN GOAL Description: <4 w/ prns Outcome: Progressing   Problem: RH KNOWLEDGE DEFICIT GENERAL Goal: RH STG INCREASE KNOWLEDGE OF SELF CARE AFTER HOSPITALIZATION Description: Manage increase knowledge of self care care after hospitalization with supervision assistance from wife using educational materials provided Outcome: Progressing

## 2024-07-29 NOTE — Progress Notes (Signed)
 Patient ID: MERLIN GOLDEN, male   DOB: 1951/08/06, 73 y.o.   MRN: [id number]  SW received updates from team reporting a family meeting is needed. SW will discuss with pt wife.  1342pm-SW spoke with pt wife about scheduling fam meeting. Scheduled for Tues at 1pm.   Graeme Jude, MSW, LCSW Office: [phone] Cell: [phone] Fax: [phone]

## 2024-07-29 NOTE — Procedures (Signed)
 Cortrak  Person Inserting Tube:  Kristopher Thompson, RD Tube Size:  10 Tube Location:  Left nare Secured by: Bridle Initial Placement:  Gastric Technique Used to Measure Tube Placement:  Marking at nare/corner of mouth Cortrak Secured At:  70 cm   Cortrak Tube Team Note:  Consult received to place a Cortrak feeding tube.   No x-ray is required. RN may begin using tube.   If the tube becomes dislodged please keep the tube and contact the Cortrak team at [email or web address] for replacement.  If after hours and replacement cannot be delayed, place a NG tube and confirm placement with an abdominal x-ray.   Thompson Mady MS, RD, LDN Registered Dietitian Clinical Nutrition RD Inpatient Contact Info in Amion

## 2024-07-29 NOTE — Progress Notes (Signed)
 Nutrition Follow Up  DOCUMENTATION CODES:  Non-severe (moderate) malnutrition in context of chronic illness (bladder cancer, tachycardia, hx bowel perforation)  INTERVENTION:  Continue DYS 3 PO diet and monitor PO intake  Initiate tube feeding via Cortrak: will begin with continuous feeds 20 h/day then transition to nocturnals once tolerance assessed; initiate at 20 ml/h and increase 10 ml q10h until goal rate is reached; allow for up to 4 hours of being disconnected per day for participation in therapies Vital 1.5 at 60 ml/h x 20 h/day (1200 ml per day)  Prosource TF20 60 ml 1x/day Provides 1880 kcal (meets 85% kcal needs), 101 gm protein (meets 84% protein needs), 916 ml free water  daily Juven BID per tube to aid in wound healing  Pt is at risk for refeeding syndrome given minimal intake for > 7 days and malnutrition. Monitor magnesium  and phosphorus daily x 4 days, MD to replete as needed. 100mg  thiamine  x 7 days  Recommended nocturnal feeds once transitioned: run continuous tube feeds from 5pm-5am daily (12 hr total) Vital 1.5 at 75 ml/h (900 ml per day)  Prosource TF20 60 ml BID Provides 1510 kcal (meets 68% kcal needs), 100 gm protein (meets 83% protein needs), 687 ml free water  daily Consider adding free water  flushes if pt is not drinking enough PO  Will conduct follow up calorie count this weekend or early next week to reassess intake and need for EN   NUTRITION DIAGNOSIS:  Moderate Malnutrition related to chronic illness (bladder cancer, tachycardia,  hx bowel perforation) as evidenced by moderate fat depletion, moderate muscle depletion, percent weight loss. Remains applicable  GOAL:  Patient will meet greater than or equal to 90% of their needs Progressing  MONITOR:  PO intake, Supplement acceptance, Weight trends  REASON FOR ASSESSMENT:  Consult Assessment of nutrition requirement/status, Calorie Count, Poor PO  ASSESSMENT:  Pt admitted to CIR with debility  following most recent admission for fatigue, mental status changes, tachycardia, hypotension and purulent drainage from abdominal wall drain. PMH significant for HFpEF, CAD, T2DM, PE on eliquis , prostate cancer, bladder cancer s/p cystoprostatectomy and ileal conduit (2013), recent admission 04/10/24 for parastomal-incisional abdominal wall hernia repair w/graft and ileal conduit reversal c/b intra-abdominal and abdominal wall abscess from bowel perforation s/p end ileostomy.  Recent Admissions: 5/30-7/11: bowel perforation s/p SBR/end ileostomy 7/14-7/16: AKI, infection of ileostomy site 7/16-8/8: Admitted to CIR 8/20-8/30: Admitted to [facility]: AKI, severe sepsis  Current Admission: 8/30 admitted to CIR 9/3 MBS, recommended DYS 3 w/ thins 9/14-9/15: calorie count; meeting 20-22% kcal and 13-21% protein needs PO 9/17 Cortrak placed  MD reported pt has agreed to Cortrak and EN feedings.  Spoke with pt who was sitting in bedside wheelchair. Pt appeared to have slightly more energy today and seemed to be more responsive to questions. Discussed how pt agreed to temporary nutrition support and how enteral nutrition will begin once Cortrak is placed. Discussed beginning with continuous feeds then transitioning to nocturnals once tolerance of tube feeds is assessed. Pt understands plan and in agreement. Pt expressed hope that he will start to feel more energized. Praised pt for efforts made and encouraged pt to continue to work hard with therapies. Pt is at refeeding risk due to limited PO intake for > 7 days, will initiate feeds at lower rate and titrate slowly to goal. Will monitor electrolyte labs and add thiamine  100 mg daily.   Will conduct calorie count over weekend to assess any improvements in appetite and intake.  Average Meal  Completion: 8/30-9/2: 43% average intake x 6 recorded meals 9/1-9/5: 31% average intake x 8 recorded meals 9/8-9/14 23% intake x 13 recorded meals  Medications:   Scheduled Meds: Children's multivitamin BID Vitamin B12 500 mcg daily Ensure BID Imodium  TID Magnesium  gluconate 500 mg daily Reglan  TID Midodrine  TID Juven BID Fibercon BID Phos-Nak Potassium chloride  20 mEq Thiamine  100 mg  Continuous Infusions:  cefTRIAXone  (ROCEPHIN )  IV 2 g (07/26/24 0034)   linezolid  (ZYVOX ) IV 600 mg (07/26/24 1013)   PRN Meds: alum & mag hydroxide-simeth, bisacodyl , diphenhydrAMINE , prochlorperazine   Labs:  Potassium 3.3 (low) Phosphorus 1.9 (low) Magnesium  1.7 (WNL) BUN 32, creatinine 1.78 GFR 40  Ileostomy: 575 ml x 24 hr Urostomy:  700 mL x 24 hours Nephrostomy (left): out x 24 hours   Diet Order:   Diet Order             DIET DYS 3 Room service appropriate? Yes; Fluid consistency: Thin  Diet effective now                   EDUCATION NEEDS:  Education needs have been addressed  Skin: Reviewed RN Assessment MASD to the right buttocks (2.5 x 6 cm) Healing midline incision from surgery 7/22  Last BM:  ileosotmy output 575 ml x 24 hr  Height:  Ht Readings from Last 1 Encounters:  07/12/24 5' 11 (1.803 m)    Weight:  Wt Readings from Last 1 Encounters:  07/29/24 105 kg   BMI:  Body mass index is 32.29 kg/m.  Estimated Nutritional Needs:   Kcal:  2200-2400  Protein:  120-135g  Fluid:  >/=2L   Josette Glance, MS, RDN, LDN Clinical Dietitian I Please reach out via secure chat

## 2024-07-29 NOTE — Progress Notes (Signed)
 Physical Therapy Session Note  Patient Details  Name: Kristopher Thompson MRN: [id number] Date of Birth: July 09, 1951  Today's Date: 07/29/2024 PT Individual Time: 8594-8550 PT Individual Time Calculation (min): 44 min   Short Term Goals: Week 2:  PT Short Term Goal 1 (Week 2): Pt will perform bed mobility with modA consistently. PT Short Term Goal 2 (Week 2): Pt will perform sit to stand with modA consistently. PT Short Term Goal 3 (Week 2): Pt will perform bed to chair with modA consistently. PT Short Term Goal 4 (Week 2): Pt will ambulates x50' with modA +2 and LRAD.  Skilled Therapeutic Interventions/Progress Updates:    Pt presents in room in [facility], responds hello when greeted and pt verbally agreeable to PT. Pt does not report pain during session. Session focused on transfer training as well as NMR for core strengthening and sitting balance needed for functional bed mobility, transfers, and ambulation. Pt transported to day room via [facility], completes sit to stand x2 to RW with max assist x2 for forward weightshift and gluteal clearance. Pt completes stand step transfer WC<>mat with max assist x2 for sit to stand and turning hips to sit as pt demonstrating difficulty advancing BLEs for turn to sit. Pt completes NMR in sitting on EOM for core strengthening, weightshifting, and seated balance including: - forward weight shift rolling ball forward/backward x10 - push ups on physioball x10 - modified sit ups in short sitting (2nd person seated behind on physioball) x10 - lateral weight shifts to elbows x5 BUE Pt transfers back to WC, trnasported back to room. Pt completes stedy transfer back to bed max assist x2, total assist x2 for bed mobility sit to supine. Pt remains supine in bed with handoff to nurses for coretrak management at end of session.  Therapy Documentation Precautions:  Precautions Precautions: Fall (HOH, Urostomy, Colostomy, PCN  drain, and orthostatic hypotension.) Recall of  Precautions/Restrictions: Impaired Precaution/Restrictions Comments: urostomy, colostomy, lt PCN drain, watch BP and HR Restrictions Weight Bearing Restrictions Per Provider Order: No    Therapy/Group: Individual Therapy  Reche Ohara PT, DPT 07/29/2024, 2:56 PM

## 2024-07-30 ENCOUNTER — Inpatient Hospital Stay (HOSPITAL_COMMUNITY)

## 2024-07-30 ENCOUNTER — Inpatient Hospital Stay: Payer: Self-pay | Admitting: Internal Medicine

## 2024-07-30 LAB — BASIC METABOLIC PANEL WITH GFR
Anion gap: 13 (ref 5–15)
BUN: 36 mg/dL — ABNORMAL HIGH (ref 8–23)
CO2: 23 mmol/L (ref 22–32)
Calcium: 8.6 mg/dL — ABNORMAL LOW (ref 8.9–10.3)
Chloride: 103 mmol/L (ref 98–111)
Creatinine, Ser: 1.69 mg/dL — ABNORMAL HIGH (ref 0.61–1.24)
GFR, Estimated: 43 mL/min — ABNORMAL LOW
Glucose, Bld: 97 mg/dL (ref 70–99)
Potassium: 3.5 mmol/L (ref 3.5–5.1)
Sodium: 139 mmol/L (ref 135–145)

## 2024-07-30 LAB — GLUCOSE, CAPILLARY
Glucose-Capillary: 100 mg/dL — ABNORMAL HIGH (ref 70–99)
Glucose-Capillary: 100 mg/dL — ABNORMAL HIGH (ref 70–99)
Glucose-Capillary: 111 mg/dL — ABNORMAL HIGH (ref 70–99)
Glucose-Capillary: 111 mg/dL — ABNORMAL HIGH (ref 70–99)
Glucose-Capillary: 119 mg/dL — ABNORMAL HIGH (ref 70–99)
Glucose-Capillary: 120 mg/dL — ABNORMAL HIGH (ref 70–99)

## 2024-07-30 LAB — CBC
HCT: 32.6 % — ABNORMAL LOW (ref 39.0–52.0)
Hemoglobin: 10.2 g/dL — ABNORMAL LOW (ref 13.0–17.0)
MCH: 26.7 pg (ref 26.0–34.0)
MCHC: 31.3 g/dL (ref 30.0–36.0)
MCV: 85.3 fL (ref 80.0–100.0)
Platelets: 136 K/uL — ABNORMAL LOW (ref 150–400)
RBC: 3.82 MIL/uL — ABNORMAL LOW (ref 4.22–5.81)
RDW: 15.2 % (ref 11.5–15.5)
WBC: 7.9 K/uL (ref 4.0–10.5)
nRBC: 0 % (ref 0.0–0.2)

## 2024-07-30 LAB — PHOSPHORUS: Phosphorus: 3.1 mg/dL (ref 2.5–4.6)

## 2024-07-30 MED ORDER — FREE WATER
200.0000 mL | Freq: Four times a day (QID) | Status: DC
  Administered 2024-07-30 – 2024-08-04 (×21): 200 mL

## 2024-07-30 NOTE — Progress Notes (Signed)
 PROGRESS NOTE   Subjective/Complaints:  No events overnight.  Feeling and looking a little bit better this a.m.  Patient complains of chills. Vitals stable, afebrile, BP looking stable 100s / 60s BUN slightly up, Cr continues to improve 1.6 today; phos repleted 3.1 Platelets now downtrending 136; WBC, CBC stable - Ucx growing 100K + yeast Ostomy output picking up considerably since starting tube feeds.  ROS: Patient denies fever, rash, sore throat, blurred vision, dizziness,  , vomiting, diarrhea, cough, shortness of breath or chest pain, joint or back/neck pain, headache, or mood change.  + Chills  Objective:   No results found.   Recent Labs    07/30/24 0406  WBC 7.9  HGB 10.2*  HCT 32.6*  PLT 136*    Recent Labs    07/29/24 0232 07/30/24 0406  NA 138 139  K 3.3* 3.5  CL 104 103  CO2 23 23  GLUCOSE 88 97  BUN 32* 36*  CREATININE 1.78* 1.69*  CALCIUM  8.5* 8.6*    Intake/Output Summary (Last 24 hours) at 07/30/2024 0931 Last data filed at 07/30/2024 0615 Gross per 24 hour  Intake 2255.51 ml  Output 1775 ml  Net 480.51 ml        Physical Exam: Vital Signs Blood pressure 101/60, pulse 89, temperature 98.2 F (36.8 C), temperature source Axillary, resp. rate 16, height 5' 11 (1.803 m), weight 105 kg, SpO2 98%.   Constitutional: Chills, right lower extremity and bilateral upper extremity rigors on exam.  Reclining in wheelchair.   HEENT: NCAT, EOMI, oral membranes moist.  Core track in place. Neck: supple, no JVD. Cardiovascular: RRR without murmur.  1+ bilateral upper extremity edema, trace bilateral lower extremity edema. Respiratory/Chest: CTA Bilaterally without wheezes or rales. Normal efforrt.  Intermittent wet sounding cough. GI/Abdomen: BS +, non-tender, non-distended. Ostomy with liquid brown stool--moderate amount. Ext: no clubbing, cyanosis. Psych: Flat, but cooperative. Skin:  abdominal  wound  with pink with granulation tissue, moist, covered in clean dressing.  Left nephrostomy tube with bag seal in place.  R flank prior tube site continues to drain--some skin induration but no warmth, minimal drainage of clear fluid.  Urostomy with some sediment, mostly clear output.   Neuro:  alert but slow processing. Follows basic commands with extra time.  [name], hospital, and partially to time.  Oriented to situation. Poor attention. --Slightly improved, but remains altered Normal language and speech.  Cranial nerve exam unremarkable.  MMT: BUE 4/5 prox to distal. BLE 2-/5 HF, 3-KE and 4/5 ADF/PF--weak but with better effort from prior exams. No limb ataxia or cerebellar signs.  Bilateral upper and lower extremity intention tremors--worse today No abnormal tone appreciated.   Musculoskeletal: Able to straighten both knees out on bed.  Plantarflexion tone bilateral lower extremities, barely able to range to neutral.   Assessment/Plan: 1. Functional deficits which require 3+ hours per day of interdisciplinary therapy in a comprehensive inpatient rehab setting. Physiatrist is providing close team supervision and 24 hour management of active medical problems listed below. Physiatrist and rehab team continue to assess barriers to discharge/monitor patient progress toward functional and medical goals  Care Tool:  Bathing  Body parts bathed by patient: Chest, Face, Abdomen, Right arm, Left arm, Front perineal area, Right upper leg, Left upper leg   Body parts bathed by helper: Buttocks, Right lower leg, Left lower leg     Bathing assist Assist Level: Moderate Assistance - Patient 50 - 74%     Upper Body Dressing/Undressing Upper body dressing   What is the patient wearing?: Hospital gown only    Upper body assist Assist Level: Moderate Assistance - Patient 50 - 74%    Lower Body Dressing/Undressing Lower body dressing      What is the patient wearing?:  Pants     Lower body assist Assist for lower body dressing: Maximal Assistance - Patient 25 - 49%     Toileting Toileting    Toileting assist Assist for toileting:  (patient has colosteomy and urostomy)     Transfers Chair/bed transfer  Transfers assist  Chair/bed transfer activity did not occur: Safety/medical concerns  Chair/bed transfer assist level: Maximal Assistance - Patient 25 - 49%     Locomotion Ambulation   Ambulation assist   Ambulation activity did not occur: Safety/medical concerns (fatigue/dizziness/orthostatics)          Walk 10 feet activity   Assist  Walk 10 feet activity did not occur: Safety/medical concerns (fatigue/dizziness/orthostatics)        Walk 50 feet activity   Assist Walk 50 feet with 2 turns activity did not occur: Safety/medical concerns (fatigue/dizziness/orthostatics)         Walk 150 feet activity   Assist Walk 150 feet activity did not occur: Safety/medical concerns (fatigue/dizziness/orthostatics)         Walk 10 feet on uneven surface  activity   Assist Walk 10 feet on uneven surfaces activity did not occur: Safety/medical concerns (fatigue/dizziness/orthostatics)         Wheelchair     Assist Is the patient using a wheelchair?: Yes Type of Wheelchair: Manual           Wheelchair 50 feet with 2 turns activity    Assist    Wheelchair 50 feet with 2 turns activity did not occur: Safety/medical concerns       Wheelchair 150 feet activity     Assist  Wheelchair 150 feet activity did not occur: Safety/medical concerns       Blood pressure 101/60, pulse 89, temperature 98.2 F (36.8 C), temperature source Axillary, resp. rate 16, height 5' 11 (1.803 m), weight 105 kg, SpO2 98%. Medical Problem List and Plan: 1. 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  Undergoing medical workups as below, will monitor improvement for the next couple of days --address at teams if current level of care exceeds ability of home caregivers.  - 9-13: Bilateral lower extremity PRAFO's in bed for plantarflexion/tight heel cords  --Continue CIR therapies including PT, OT, and SLP. Progress has been very slow. May need to consider other discharge venue   - 9/16: Declining with therapies; pleasantly confused today, interactions much less withdrawn but nonsensical. Will need Mod-Max A at home. Concern for physical decline exceeding family ability for caregiving if no improvements moving forward.   - 9-17 waiting on calorie  counts today to determine if feeding tube needed; if patient/family continue to refuse, would recommend goals of care discussion and possible palliative care consult--patient opted for placement of tube feed  - 9-18: Due to ongoing significant support needed to maintain blood pressure, as well as chills today, discussed additional workup/treatment with infectious disease; recommend against treatment of yeast at this time given likely contamination, given ongoing decline will get lactic acid and repeat CT abdomen and pelvis without contrast in a.m.  2.  H/o PE/Antithrombotics: -DVT/anticoagulation:  Pharmaceutical: Eliquis              -antiplatelet therapy: N/A  - No further blood appreciated in nephrostomy tubes  3. Pain Management: Oxycodone  prn.    - 9/16: Oxycodone  was removed yesterday due to concerns of this contributing to PM confusion and hypotension; nursing reports family in disagreement with this and patient pain increased. Adjust to Tylenol  PRN for mild to moderate, Percocet 5 mg for severe pain to avoid undertreatment. Monitor BP VERY closely  9-18: Has not required Percocet again since 9-15  4.  Mood/Behavior/Sleep: LCSW to follow for evaluation and support.              --melatonin for insomnia.              -antipsychotic agents: N/A  9-14: Per patient, sleeping well.  Sleep log.  Discussed delirium management with patient's wife  - 9-18 sleep log remains appropriate, no apparent hallucinations or delusions.  Continue current management  5. Neuropsych/cognition/encephalopathy likely secondary to metabolic acidosis vs delirium from prolonged illness/hospitalization: This patient is not capable of making decisions on his own behalf.  - 9-11: Increasingly withdrawn, behavioral changes may be secondary to poor nutritional intake and hyponatremia.  Treating as below, on sertraline  100 mg daily, may need to add additional agent such as mirtazapine but hesitant given ongoing cognitive issues.  9-12: Slightly better today, but consistent with hypoactive delirium with primary difficulty with concentration, no focal neurologic deficits.  Start overnight sleep log, delirium precautions.  Suspect hyponatremia as primary contributor, management as below.  9/13: D3C Flexeril , Megace , Zoloft  due to ongoing cognitive changes.  Sodium is improving.  Get LFTs, ammonia today to evaluate ongoing cognitive deficits--within normal limits.  -14: Cognition seems to be clearing a bit, continue current regimen.   9/15 alert but delayed. Continue to maximize renal/volume/nutritional status 9/16: Much more alert, but delirious; sleep log appears appropriate. Will add Zyprexa  5 mg PRN for aggitation d/t increased aggitation yesterday afternoon. Keep low dose / low likelihood of Qtc prolongation medications only.   9-18: Cognition improving very gradually, QTc remains prolonged.  6. Skin/Wound Care: Routine pressure relief measures. Encourage adequate diet             --dressing changes daily. Gerhardt's cream to buttock wound.   -abdominal wound continues to close despite tenous nutritional status 7.  Fluids/Electrolytes/Nutrition/oropharyngeal dysphagia/moderate to severe protein calorie malnutrition:                --9/10 periactin  yesterday stopped d/t lethargy   -will try megace  beginning today   -RD f/u   -push fluids   -recheck bmet in am   -continue juven bid. Has been refusing Ensure supplement.   -kdur 20meq daily   -can't use zofran  d/t QTc prolongation (490)  - MBS   9/3--D3/thins, prealbumin borderline normal at 20   - 9-11: Nausea remains a primary barrier to adequate p.o. intakes.  Recheck EKG today, QTc was 460, scheduled  Zofran  4 mg IV 3 times daily AC.  Will repeat EKG in next 1 to 2 days.  - 9-12: Albumin  infusion per nephrology.  P.o. intakes improving with Zofran .  per IV team midline contraindicated due to bicarb.  May get PICC line once dialysis no longer considered.  9-14: P.o. intakes okay, Zofran  continues to help with nausea.  Per nephrology, okay to reduce fluids, will keep on 70 cc/h normal saline for maintenance for now.  PICC line order placed.  9/15 intake 20-30%. Receiving mtc IVF.    -k+ 3.3, mg++ 1.3   -continue kdur 20meq daily and add 40meq to IVF   -IV mag sulfae 2g x1, add mag gluconate oral daily    9/16: Mag, K repleted. PO intakes remain very poor. Albumin  2.0. Continue maintenance fluids--will likely need TF.   9/17: Ad phosnak 1 packet x3 doses for repletion of K and Phos; repeat in AM--nephrology changed to IV--repleted 9-18  8. Sepsis/Abdominal wall/mesh infection: IV Vanc/Ceftriaxone  for 6 weeks from 08/21             --Probable 6 months total of antibiotics--ID follow up 09/18 @10  am --Corynebacterium species-->6 more weeks ?Dalbavancin for OP use, cont ceftriazone and linezolid  for now  per ID -appreciate recent ID f/u CRP nl 0.8 ESR 44 WBC 8.2 9-11: WBC labs remained stable. 9/15 wbc 7.1 9/16: Discussed with Dr. Dennise ID possibility of encephalopathy with current antibiotics, as well as +UA yesterday. Recommendation to continue current  broad spectrum antibiotics as they are unlikely contributors and regimen has already been altered from vancomycin . Given some yeast in UA will await culture to see if secondary fungal infection present vs contaminant. -pending 9/17 9/18; per Dr. Dennise, would not treat yeast is likely contaminated with no WBC elevations and no fevers.  Given symptomatic chills and significant blood pressure support ongoing, concern for untreated/undertreated infectious etiology in setting of adrenal insufficiency, will get CT abdomen and pelvis and lactic acid in a.m. for further assessment  9. Left> right hydronephrosis: Nephrostomy tubes with stent exchanged on 08/27 --continue clamping right tube and left to drain per urology.  Monitor for blood recheck CBC in am --hemoglobin stable, no further bloody output appreciated -9/15 flush left nephrostomy tube 9-18: Prior right tube site continues to drain, CT abdomen and pelvis as above  10. Acute on chronic renal failure/hyponatremia:  elevated CR- baseline creat probably ~1.4  -9/10 will give addnl IVF today d/t poor po intake   -recheck bmet in am  - 9-11: AKI significantly worse, now with hyponatremia 131.  Will give 1 L fluid bolus normal saline today, recheck BMP; given brisk uptrend despite increase in IV fluids will consult nephrology for further recommendations.  - 9-12: Appreciate nephrology input and recommendations.  Responding slightly to increased IV fluids overnight, normal saline 125/h.  Hyponatremia remains 131. On multiple medications that could be contributing including Florinef , sertraline , and linezolid .  See their note for specific recommendations; advised to remain on fluids for primary treatment of hyponatremia, do not recommend salt tabs at this time.  - If renal function does not improve with fluids may need CT scan of the abdomen and pelvis to verify patency of nephrostomy/ileal conduit drainage   9-13: Potassium chloride  packet 40 mill  equivalents ordered this a.m. for repletion.  Sodium improving, bicarb improving, but renal function remains unchanged on bicarb drip.  9-14: Renal function improved, acidosis resolved, sodium 134.  Per nephrology, can DC fluids, but agreeable to maintenance fluids  given ongoing poor p.o. intakes.  Continue to monitor closely.  9/15-17 Cr continues to improve gradually, continue IVF, push po  9/18: On TF, DC IVF, move to FWF 200 ccs QID--monitor closely    11. OSA: Encourage CPAP use 12. Hypotension/tachycardia/?adrenal insufficiency: Continue Midodrine  15 mg TID and Florinef  2 mg daily. Plasma cortisol 13.6 6/25/             --continues to have significant tachycardia with activity likely due to orthostasis and deconditioning.  - -hgb 11.5   -continue metoprolol  low dose 12.5mg  bid, bp remains soft but stable at present   -  IVF as above Per PT- has been not dropping in therapy   9-13: Orthostatic today, DC Lopressor  and increase midodrine  to 20 mg 3 times daily.  Has been getting a significant amount of IV fluid last few days, still likely volume depleted per nephrology.  9-14: Blood pressure remains stable, but soft. 9/15 hypotensive this morning.  Continue IVF and plan as above 9/16: A bit improved today; continue IVF, no signs of volume overload. If profoundly hypotensive again may need to transfer to acute given maximizing fluids/BP support without pressors.  9-18: DC IV fluid as above today; monitor blood pressures very closely, seem more stable than they were last week.  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    9/1 held reglan   -9/16: resume reglan  given low OP, nausea--with benefit, output remains low   - 9/18: Ostomy output picking up  considerably with tube feeds; on fibercon BID; appreciate nutrition input  14. Depression: On Zoloft -- Dced 9/13 due to hyponatremia and confusion   - 9/16: Family concerned for serotonin syndrome with zoloft +linezolid - no tachycardia, fevers, seizures, or severe agitation reported - encephalopathy has not improved with removal of zoloft . Low suspicion at this time.   15. Malnutrition: Albumin  @1 .9/TP-5.1.  --> 2.2 9/11;  - 9-11: Adding Premeal Zofran  as above.  Will get nutrition to start calorie count; may need additional supplementation or discussion of feeding tube if does not improve.  - 9-12: Slightly improved, continue this regimen.  9-13: Very poor p.o. intakes yesterday.  DC Megace  due to possible cognitive effects, no perceived benefit.  Will discuss possible feeding tube with family--patient rejects, will get calorie count for 48 hours and if no improvement may readdress tube feed versus palliative consult  9-14: Wife encouraging p.o.'s, patient motivated to avoid feeding tube.  Calorie count started.  Maintenance fluids as above.  9/16: Qtc 519; DC zofran , resume relgan 5 mg TID AC as he did well with this in the past and ostomy output has slowed considerably  9-17: QTc remains prolonged; will not resume Zofran  at this time.  Patient agreeable to placement of core track and discussed with nutrition starting with nocturnal tube feeds for supplementation, to allow appetite during the day to continue to work on increasing p.o.'s.  Will give one-time Xanax  to assist in anxiety with core track placement.  9-18: Tolerating tube feeds well, labs looking stable.  P.o. intakes poor this a.m. due to timing of therapy/medication/breakfast, continue to encourage as primary form of nutrition.   16. Anemia of chronic illness: Stable overall 9/8-11  - Hemoglobin stable        Latest Ref Rng & Units  07/30/2024    4:06 AM 07/27/2024    2:29 AM 07/25/2024    1:18 PM  CBC  WBC 4.0 - 10.5 K/uL 7.9  7.1   6.9   Hemoglobin 13.0 - 17.0 g/dL 89.7  89.3  89.0   Hematocrit 39.0 - 52.0 % 32.6  32.9  32.7   Platelets 150 - 400 K/uL 136  215  285     17.  Thrombocytopenia.  Mild, 136 today.  Monitor very closely in setting of linezolid .    LOS: 19 days A FACE TO FACE EVALUATION WAS PERFORMED  Joesph JAYSON Likes 07/30/2024, 9:31 AM

## 2024-07-30 NOTE — Progress Notes (Signed)
 Speech Language Pathology Daily Session Note  Patient Details  Name: Kristopher Thompson MRN: [id number] Date of Birth: January 08, 1951  Today's Date: 07/30/2024 SLP Individual Time: 1010-1030 SLP Individual Time Calculation (min): 20 min and Today's Date: 07/30/2024 SLP Missed Time: 10 Minutes Missed Time Reason: Nursing care  Short Term Goals: Week 3: SLP Short Term Goal 1 (Week 3): Patient will demonstrate basic problem solving skills with max multimodal A SLP Short Term Goal 2 (Week 3): Patient will recall daily information with supervision verbal A SLP Short Term Goal 3 (Week 3): Patient will express wants/needs given min multimodal A SLP Short Term Goal 4 (Week 3): Patient will consume least restrictive diet with mod multimodal A for use of compensatory strategies SLP Short Term Goal 5 (Week 3): Patient will participate in pharyngeal strengthening exercises  Skilled Therapeutic Interventions:   Pt and wife greeted in his room. Nursing was administering medications upon SLP arrival. No overt s/s of airway invasion noted w/ thin liquids, however pt was gagging throughout. When taking his final pill, he reported globus sensation. SLP provided water  and ginger ale in attempts to clear it. Also provided him w/ 2 trials of cheerios (Dys 3), however, he denied additional trials after this. He reported only slight alleviation in globus sensation after this and was provided w/ education re globus sensation and potential causes. SLP also completed orientation review and he required maxA for orientation to date. At the end of tx tasks, he was left in his [facility] w/ nursing present in the room. Recommend cont ST per POC.   Pain Pain Assessment Pain Scale: 0-10 Pain Score: 0-No pain  Therapy/Group: Individual Therapy  Kristopher Thompson 07/30/2024, 1:23 PM

## 2024-07-30 NOTE — Progress Notes (Signed)
 Occupational Therapy Session Note  Patient Details  Name: Kristopher Thompson MRN: [id number] Date of Birth: 1951/08/28  Today's Date: 07/30/2024 OT Individual Time: 9167-9070 OT Individual Time Calculation (min): 57 min    Short Term Goals: Week 3:  OT Short Term Goal 1 (Week 3): Pt will maintain static sitting balance with min A OT Short Term Goal 2 (Week 3): Pt will complete sit > stand from elevated surface with mod A OT Short Term Goal 3 (Week 3): Pt will complete bed mobility rolling R and L with min A to reduce burden of care for wife  Skilled Therapeutic Interventions/Progress Updates:    Pt received resting in bed with wife emptying urostomy bag presenting to be in good spirits receptive to skilled OT session reporting 0/10 pain- OT offering intermittent rest breaks, repositioning, and therapeutic support to optimize participation in therapy session. Pt came to EOB with MAX A +2 to provide stability for trunk when sitting up, able to walk feet over to EOB with MOD cues. Pt then completed sit>stand into STEDY with MAX A +2 for trunk control and maintaining standing. Completed transfer via STEDY to wheelchair with MAX +2 to maintain upright posture and trunk control. Transported Pt via WC to nurses desk d/t feeding tube pump alerts. Nursing addressed alerts and disconnected feed. Transported via WC to day room. Completed 1 stand utilizing RW with MAX A +2 for 20 secs before Pt fatigue and returned to WC. Educated Pt on PLB to slow HR/RR post standing. Extended rest breaks provided for energy conservation and fatigue. Tried to compete second stand, Pt reported he felt like his drain was pulling and was lowered back into chair. Added guaze pad to provide extra support. Pt transported back to room via WC d/t time management, reported he felt pulling to nursing. Pt was left resting in [facility] with call bell in reach, wife present and all needs met.    Therapy Documentation Precautions:   Precautions Precautions: Fall (HOH, Urostomy, Colostomy, PCN  drain, and orthostatic hypotension.) Recall of Precautions/Restrictions: Impaired Precaution/Restrictions Comments: urostomy, colostomy, lt PCN drain, watch BP and HR Restrictions Weight Bearing Restrictions Per Provider Order: No  Therapy/Group: Individual Therapy  Paulina Fleeta Dixie 07/30/2024, 12:05 PM

## 2024-07-30 NOTE — Plan of Care (Signed)
  Problem: Consults Goal: RH GENERAL PATIENT EDUCATION Description: See Patient Education module for education specifics. Outcome: Progressing   Problem: RH SAFETY Goal: RH STG ADHERE TO SAFETY PRECAUTIONS W/ASSISTANCE/DEVICE Description: STG Adhere to Safety Precautions With Assistance/Device. Outcome: Progressing   Problem: RH SKIN INTEGRITY Goal: RH STG SKIN FREE OF INFECTION/BREAKDOWN Description: Manage skin free of infection with supervision assistance Outcome: Not Progressing   Problem: RH KNOWLEDGE DEFICIT GENERAL Goal: RH STG INCREASE KNOWLEDGE OF SELF CARE AFTER HOSPITALIZATION Description: Manage increase knowledge of self care care after hospitalization with supervision assistance from wife using educational materials provided Outcome: Not Progressing

## 2024-07-30 NOTE — Progress Notes (Signed)
 Physical Therapy Weekly Progress Note  Patient Details  Name: Kristopher Thompson MRN: [id number] Date of Birth: [birth year]  Beginning of progress report period: July 23, 2024 End of progress report period: July 30, 2024  Today's Date: 07/30/2024 PT Individual Time: 1120-1202 PT Individual Time Calculation (min): 42 min   Today's Date: 07/30/2024 PT Individual Time: 8575-8466 PT Individual Time Calculation (min): 69 min   Patient has met 0 of 4 short term goals. Pt has made very little progress toward mobility goals, continuing to require maxA +2 overall for mobility, with limited initiation and carryover between sessions. Pt is grossly deconditioned and globally weak. Pt has recently had NG tube placed to increase caloric intake and nutrition, so possibility remains that pt could make functional progress with improved intake. Pt will benefit from family ed if DC plan remains to be home.   Patient continues to demonstrate the following deficits muscle weakness, decreased cardiorespiratoy endurance, decreased initiation, decreased attention, decreased awareness, decreased problem solving, decreased safety awareness, decreased memory, and delayed processing, and decreased sitting balance, decreased standing balance, decreased postural control, and decreased balance strategies and therefore will continue to benefit from skilled PT intervention to increase functional independence with mobility.  Patient not progressing toward long term goals.  See goal revision..  Plan of care revisions: Pt's long term goals downgraded due to lack of progress. .  PT Short Term Goals Week 2:  PT Short Term Goal 1 (Week 2): Pt will perform bed mobility with modA consistently. PT Short Term Goal 1 - Progress (Week 2): Revised due to lack of progress PT Short Term Goal 2 (Week 2): Pt will perform sit to stand with modA consistently. PT Short Term Goal 2 - Progress (Week 2): Revised due to lack of  progress PT Short Term Goal 3 (Week 2): Pt will perform bed to chair with modA consistently. PT Short Term Goal 3 - Progress (Week 2): Revised due to lack of progress PT Short Term Goal 4 (Week 2): Pt will ambulates x50' with modA +2 and LRAD. PT Short Term Goal 4 - Progress (Week 2): Progressing toward goal Week 3:  PT Short Term Goal 1 (Week 3): STGs = LTGs  Skilled Therapeutic Interventions/Progress Updates:  Ambulation/gait training;Discharge planning;Functional mobility training;Therapeutic Activities;Balance/vestibular training;Disease management/prevention;Neuromuscular re-education;Skin care/wound management;Therapeutic Exercise;Wheelchair propulsion/positioning;DME/adaptive equipment instruction;Pain management;UE/LE Strength taining/ROM;Community reintegration;Patient/family education;Stair training;UE/LE Coordination activities   1st Session: Pt received seated in tilt in space WC and agrees to therapy. No complaint of pain. WC transport outside for therapeutic environment with natural light to enhance mood and cognition. Pt performs x2 reps of sit to stand with maxA +2 and pt unable to shift weight far enough anteriorly to maintain balance without assistance. Pt requires modA +2 to maintain standing balance for up to ~30 seconds each bout, with extended seated rest break. PT provides cues for body mechanics and posture to improve balance. Pt left seated in WC with all needs within reach.   2nd Session: Pt received seated in tilt in space WC and agrees to therapy. No complaint of pain. Wc transport to gym. Pt participates in dance activity with other patients present. Performed for aerobic activity and endurance training, as well as tasking pt with performing coordinated movements in conjunction with auditory and visual cues. PT provides verbal cues to encourage pt and provide feedback on performance, as well as cueing for body mechanics. Following, pt performs stand pivot to Nustep with maxA  +2 and cues for initiation and sequencing. Pt  has very little initiatoin and extension through legs during transfer. Pt completes Nustep x10:00 at workload of 3 with average steps per minute ~35. PT provides cues for hand and foot placement and completing full available ROM. Pt performs stand pivot back to [facility] with totalA +2 and minimal WB or extension through legs. WC transport back to room. TotalA +1 required for squat pivot back to bed and maxA +1 for sit to supine. Left with all needs within reach.   Therapy Documentation Precautions:  Precautions Precautions: Fall (HOH, Urostomy, Colostomy, PCN  drain, and orthostatic hypotension.) Recall of Precautions/Restrictions: Impaired Precaution/Restrictions Comments: urostomy, colostomy, lt PCN drain, watch BP and HR Restrictions Weight Bearing Restrictions Per Provider Order: No   Therapy/Group: Individual Therapy  Elsie JAYSON Dawn, PT, DPT 07/30/2024, 5:02 PM

## 2024-07-30 NOTE — Progress Notes (Signed)
 [facility] NEPHROLOGY PROGRESS NOTE  Assessment/ Plan: Pt is a 73 y.o. 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  # Acute kidney injury on CKD IIIA:  - Likely hemodynamically mediated with hypotension and decreased oral intake.   - predisposed to pre-renal injury with ostomy - cortrak placed today follow response to feeds - His family and primary team has been concerned about very poor oral intake   -  renal panel in AM  # Decreased responsiveness/AMS - improved - concurrent with BP and fluids  - s/p NS bolus 500 mL once on 9/15 and increase in rate of NS back to 75 ml/hr - note he has been on ceftriaxone  and linezolid    - abx per primary team     # Hyponatremia: Sodium level improving with fluids.    # Metabolic acidosis, improved and now off of bicarb  # Sepsis due to abdominal wall infection on antibiotics per primary team.  # Hypokalemia: Continue to replete potassium chloride  - he is ordered for 20 meq potassium daily.  Hypomagnesemia which was repleted.   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  Disposition - Continue monitoring in rehab     Subjective:  He had 1.4 L UOP recorded over 9/17.  Spoke with his wife at bedside.  He feels better today.     Review of systems:     He reports shortness of breath but relates it to the cortrak in his nose; denies chest pain Getting feeds now  Some nausea when tries to eat    Objective Vital signs in last 24 hours: Vitals:   07/29/24 1538 07/29/24 1930 07/30/24 0452 07/30/24 0500  BP: 100/75 100/69 101/60   Pulse: 81 92 89   Resp: 18 18 16    Temp: 98.8 F  (37.1 C) (!) 97.5 F (36.4 C) 98.2 F (36.8 C)   TempSrc: Axillary  Axillary   SpO2: 100% 99% 98%   Weight:    105 kg  Height:       Weight change: 0 kg  Intake/Output Summary (Last 24 hours) at 07/30/2024 1613 Last data filed at 07/30/2024 1248 Gross per 24 hour  Intake 2335.51 ml  Output 1725 ml  Net 610.51 ml       Labs: RENAL PANEL Recent Labs  Lab 07/25/24 1318 07/26/24 0529 07/27/24 0229 07/28/24 0250 07/29/24 0232 07/29/24 1524 07/30/24 0406  NA  --  134* 136  133* 138 138  --  139  K  --  3.0* 3.3*  3.2* 3.5 3.3*  --  3.5  CL  --  97* 97*  94* 102 104  --  103  CO2  --  26 27  29 25 23   --  23  GLUCOSE  --  82 91  92 102* 88  --  97  BUN  --  49* 43*  44* 38* 32*  --  36*  CREATININE  --  2.14* 1.98*  1.98* 2.01* 1.78*  --  1.69*  CALCIUM   --  9.4 8.8*  8.8* 8.6* 8.5*  --  8.6*  MG  --   --  1.3* 1.9 1.7  --   --  PHOS  --  2.7 2.4* 2.5 1.9* 1.3* 3.1  ALBUMIN  2.8* 2.5* 2.2* 2.0* 1.9*  --   --     Liver Function Tests: Recent Labs  Lab 07/25/24 1318 07/26/24 0529 07/27/24 0229 07/28/24 0250 07/29/24 0232  AST 15  --   --   --   --   ALT 13  --   --   --   --   ALKPHOS 73  --   --   --   --   BILITOT 0.4  --   --   --   --   PROT 5.5*  --   --   --   --   ALBUMIN  2.8*   < > 2.2* 2.0* 1.9*   < > = values in this interval not displayed.   No results for input(s): LIPASE, AMYLASE in the last 168 hours. Recent Labs  Lab 07/25/24 1318  AMMONIA 21   CBC: Recent Labs    05/02/24 0515 05/04/24 0341 07/22/24 0729 07/23/24 0430 07/25/24 1318 07/27/24 0229 07/30/24 0406  HGB  --    < > 11.9* 11.3* 10.9* 10.6* 10.2*  MCV  --    < > 83.4 82.4 80.1 82.7 85.3  FERRITIN 588*  --   --   --   --   --   --   TIBC 281  --   --   --   --   --   --   IRON  54  --   --   --   --   --   --    < > = values in this interval not displayed.    Cardiac Enzymes: No results for input(s): CKTOTAL, CKMB, CKMBINDEX, TROPONINI in the last 168  hours. CBG: Recent Labs  Lab 07/29/24 2101 07/30/24 0005 07/30/24 0449 07/30/24 0857 07/30/24 1159  GLUCAP 115* 120* 100* 111* 119*    Iron  Studies: No results for input(s): IRON , TIBC, TRANSFERRIN, FERRITIN in the last 72 hours. Studies/Results: No results found.   Medications: Infusions:  cefTRIAXone  (ROCEPHIN )  IV 2 g (07/30/24 0030)   feeding supplement (VITAL 1.5 CAL) 30 mL/hr at 07/30/24 0346   linezolid  (ZYVOX ) IV 600 mg (07/30/24 1032)    Scheduled Medications:  apixaban   5 mg Oral BID   childrens multivitamin  1 tablet Oral BID   Chlorhexidine  Gluconate Cloth  6 each Topical Q12H   cyanocobalamin   500 mcg Oral Q M,W,F   feeding supplement (PROSource TF20)  60 mL Per Tube Daily   fludrocortisone   0.2 mg Oral Daily   free water   200 mL Per Tube Q6H   Gerhardt's butt cream  1 Application Topical BID   leptospermum manuka honey  1 Application Topical Daily   loperamide   4 mg Oral TID   magnesium  gluconate  500 mg Oral Daily   melatonin  3 mg Oral QHS   metoCLOPramide   5 mg Oral TID AC   midodrine   20 mg Oral TID WC   nutrition supplement (JUVEN)  1 packet Per Tube BID BM   omega-3 acid ethyl esters  1 g Oral QODAY   polycarbophil  625 mg Oral BID   potassium chloride   20 mEq Oral Daily   rosuvastatin   10 mg Oral QPM   sodium chloride  flush  10-40 mL Intracatheter Q12H   thiamine   100 mg Per Tube Daily    have reviewed scheduled and prn medications.  Physical Exam:      General  elderly male in bed in no acute distress HEENT normocephalic atraumatic extraocular movements intact sclera anicteric Neck supple trachea midline Lungs clear to auscultation bilaterally normal work of breathing at rest on room air Heart S1S2 no rub Abdomen soft nontender nondistended; left nephrostomy bag; ostomy in place Extremities no edema appreciated Psych blunted affect  Neuro awake and oriented to person, states year is 2025    Katheryn JAYSON Saba 07/30/2024, 4:23  PM  LOS: 19 days

## 2024-07-30 NOTE — Plan of Care (Signed)
  Problem: Consults Goal: RH GENERAL PATIENT EDUCATION Description: See Patient Education module for education specifics. Outcome: Progressing   Problem: RH BOWEL ELIMINATION Goal: RH STG MANAGE BOWEL WITH ASSISTANCE Description: STG Manage Bowel with supervision Assistance. Outcome: Progressing   Problem: RH BLADDER ELIMINATION Goal: RH STG MANAGE BLADDER WITH ASSISTANCE Description: STG Manage Bladder With supervision Assistance Outcome: Progressing   Problem: RH SKIN INTEGRITY Goal: RH STG SKIN FREE OF INFECTION/BREAKDOWN Description: Manage skin free of infection with supervision assistance Outcome: Progressing   Problem: RH SAFETY Goal: RH STG ADHERE TO SAFETY PRECAUTIONS W/ASSISTANCE/DEVICE Description: STG Adhere to Safety Precautions With Assistance/Device. Outcome: Progressing   Problem: RH PAIN MANAGEMENT Goal: RH STG PAIN MANAGED AT OR BELOW PT'S PAIN GOAL Description: <4 w/ prns Outcome: Progressing   Problem: RH KNOWLEDGE DEFICIT GENERAL Goal: RH STG INCREASE KNOWLEDGE OF SELF CARE AFTER HOSPITALIZATION Description: Manage increase knowledge of self care care after hospitalization with supervision assistance from wife using educational materials provided Outcome: Progressing

## 2024-07-31 ENCOUNTER — Inpatient Hospital Stay (HOSPITAL_COMMUNITY)

## 2024-07-31 ENCOUNTER — Other Ambulatory Visit (HOSPITAL_COMMUNITY): Payer: Self-pay

## 2024-07-31 DIAGNOSIS — B9622 Other specified Shiga toxin-producing Escherichia coli [E. coli] (STEC) as the cause of diseases classified elsewhere: Secondary | ICD-10-CM

## 2024-07-31 LAB — RENAL FUNCTION PANEL
Albumin: 1.8 g/dL — ABNORMAL LOW (ref 3.5–5.0)
Anion gap: 9 (ref 5–15)
BUN: 38 mg/dL — ABNORMAL HIGH (ref 8–23)
CO2: 22 mmol/L (ref 22–32)
Calcium: 8.3 mg/dL — ABNORMAL LOW (ref 8.9–10.3)
Chloride: 105 mmol/L (ref 98–111)
Creatinine, Ser: 1.57 mg/dL — ABNORMAL HIGH (ref 0.61–1.24)
GFR, Estimated: 47 mL/min — ABNORMAL LOW (ref >60)
Glucose, Bld: 103 mg/dL — ABNORMAL HIGH (ref 70–99)
Phosphorus: 1.6 mg/dL — ABNORMAL LOW (ref 2.5–4.6)
Potassium: 3.8 mmol/L (ref 3.5–5.1)
Sodium: 136 mmol/L (ref 135–145)

## 2024-07-31 LAB — CBC WITH DIFFERENTIAL/PLATELET
Abs Immature Granulocytes: 0.02 K/uL (ref 0.00–0.07)
Basophils Absolute: 0.1 K/uL (ref 0.0–0.1)
Basophils Relative: 1 %
Eosinophils Absolute: 0.7 K/uL — ABNORMAL HIGH (ref 0.0–0.5)
Eosinophils Relative: 10 %
HCT: 30.6 % — ABNORMAL LOW (ref 39.0–52.0)
Hemoglobin: 9.7 g/dL — ABNORMAL LOW (ref 13.0–17.0)
Immature Granulocytes: 0 %
Lymphocytes Relative: 21 %
Lymphs Abs: 1.5 K/uL (ref 0.7–4.0)
MCH: 26.7 pg (ref 26.0–34.0)
MCHC: 31.7 g/dL (ref 30.0–36.0)
MCV: 84.3 fL (ref 80.0–100.0)
Monocytes Absolute: 0.6 K/uL (ref 0.1–1.0)
Monocytes Relative: 8 %
Neutro Abs: 4.3 K/uL (ref 1.7–7.7)
Neutrophils Relative %: 60 %
Platelets: 104 K/uL — ABNORMAL LOW (ref 150–400)
RBC: 3.63 MIL/uL — ABNORMAL LOW (ref 4.22–5.81)
RDW: 14.8 % (ref 11.5–15.5)
WBC: 7.2 K/uL (ref 4.0–10.5)
nRBC: 0 % (ref 0.0–0.2)

## 2024-07-31 LAB — GLUCOSE, CAPILLARY
Glucose-Capillary: 106 mg/dL — ABNORMAL HIGH (ref 70–99)
Glucose-Capillary: 108 mg/dL — ABNORMAL HIGH (ref 70–99)
Glucose-Capillary: 117 mg/dL — ABNORMAL HIGH (ref 70–99)
Glucose-Capillary: 126 mg/dL — ABNORMAL HIGH (ref 70–99)
Glucose-Capillary: 127 mg/dL — ABNORMAL HIGH (ref 70–99)
Glucose-Capillary: 129 mg/dL — ABNORMAL HIGH (ref 70–99)
Glucose-Capillary: 99 mg/dL (ref 70–99)

## 2024-07-31 LAB — PHOSPHORUS: Phosphorus: 1.6 mg/dL — ABNORMAL LOW (ref 2.5–4.6)

## 2024-07-31 LAB — LACTIC ACID, PLASMA: Lactic Acid, Venous: 1.9 mmol/L (ref 0.5–1.9)

## 2024-07-31 MED ORDER — VITAL 1.5 CAL PO LIQD
1000.0000 mL | ORAL | Status: DC
  Administered 2024-07-31: 1000 mL
  Filled 2024-07-31: qty 1000

## 2024-07-31 MED ORDER — LIDOCAINE VISCOUS HCL 2 % MT SOLN: 15.0000 mL | Freq: Four times a day (QID) | OROMUCOSAL | Status: DC | PRN

## 2024-07-31 MED ORDER — POTASSIUM CHLORIDE CRYS ER 10 MEQ PO TBCR
10.0000 meq | EXTENDED_RELEASE_TABLET | Freq: Every day | ORAL | Status: DC
  Administered 2024-08-01 – 2024-08-14 (×13): 10 meq via ORAL
  Filled 2024-07-31 (×16): qty 1

## 2024-07-31 MED ORDER — PROSOURCE TF20 ENFIT COMPATIBL EN LIQD
60.0000 mL | Freq: Two times a day (BID) | ENTERAL | Status: DC
  Administered 2024-07-31 – 2024-08-05 (×10): 60 mL
  Filled 2024-07-31 (×10): qty 60

## 2024-07-31 MED ORDER — ORITAVANCIN DIPHOSPHATE 400 MG IV SOLR
1200.0000 mg | Freq: Once | INTRAVENOUS | Status: AC
  Administered 2024-07-31: 1200 mg via INTRAVENOUS
  Filled 2024-07-31: qty 120

## 2024-07-31 MED ORDER — POTASSIUM & SODIUM PHOSPHATES 280-160-250 MG PO PACK
2.0000 | PACK | ORAL | Status: AC
  Administered 2024-07-31 – 2024-08-01 (×5): 2
  Filled 2024-07-31 (×5): qty 2

## 2024-07-31 MED ORDER — VITAL 1.5 CAL PO LIQD
1000.0000 mL | ORAL | Status: DC
  Administered 2024-07-31 – 2024-08-03 (×5): 1000 mL
  Filled 2024-07-31 (×4): qty 1000

## 2024-07-31 NOTE — Progress Notes (Signed)
 Physical Therapy Session Note  Patient Details  Name: Kristopher Thompson MRN: [id number] Date of Birth: 09-04-1951  Today's Date: 07/31/2024 PT Individual Time: 0955-1020 and 1330-1430 PT Individual Time Calculation (min): 25 min and 60 min.  Short Term Goals: Week 3:  PT Short Term Goal 1 (Week 3): STGs = LTGs  Skilled Therapeutic Interventions/Progress Updates:   First session:  Pt unavailable going for CT.  Pt returned and agreeable to getting OOB.  Pt transfers sup to sit w/ mod A and cues, initiating LES to EOB.  Pt required Max A for sit to stand from elevated bed on 2nd attempt.  Cueing needed for forward lean.  Pt attempted SPT to R.  Pt able to advance RLE, but unable to clear L.  Max A to complete SPT.  Pt scooted to back of seat.  Pt reclined back and remained sitting w/ spouse present and feeding.Missed time of 50 min 2/2 CT.  Second session: Pt presents sitting in TIS reclined back and agreeable to therapy.  Pt w/ improved time for answering questions.  Pt wheeled to main gym for energy conservation.  Pt performed multiple sit to stand to EVA walker w/ max A and cues for hand placement.  BP in sitting at 115/81 and unable to remain standing to record standing BP.  Pt c/o lightheadedness and able to stand only up to 30 secs.  Pt required seated rest breaks 2/2 fatigue but states dissolution of symptoms w/in 30 sec.  Pt performed sit to stand w/ max A and then SPT w/c > bed w/ + 2 to ensure hips turned to bed.  Pt requires max A for sit to sidelying/supine.  Pt rolled side to side w/ mod A and cues for hooklying and use of side rail for placement of lift pad.  PRAFOS donned to B feet and all rails up, all needs in reach.     Therapy Documentation Precautions:  Precautions Precautions: Fall (HOH, Urostomy, Colostomy, PCN  drain, and orthostatic hypotension.) Recall of Precautions/Restrictions: Impaired Precaution/Restrictions Comments: urostomy, colostomy, lt PCN drain, watch BP and  HR Restrictions Weight Bearing Restrictions Per Provider Order: No General: PT Amount of Missed Time (min): 50 Minutes PT Missed Treatment Reason: CT/MRI Vital Signs:   Pain: no c/o     Therapy/Group: Individual Therapy  Reyes SHAUNNA Sierra 07/31/2024, 10:46 AM

## 2024-07-31 NOTE — Progress Notes (Signed)
 Nutrition Follow Up  DOCUMENTATION CODES:  Non-severe (moderate) malnutrition in context of chronic illness (bladder cancer, tachycardia, hx bowel perforation)  INTERVENTION:  Continue DYS 3 PO diet and monitor PO intake  Recommend transitioning to nocturnal feeds: run continuous tube feeds from 5pm-5am daily (12 hr total) Vital 1.5 at 75 ml/h (900 ml per day)  Prosource TF20 60 ml BID Provides 1510 kcal (meets 68% kcal needs), 100 gm protein (meets 83% protein needs), 687 ml free water  daily FWF: 200 mL q6h (800 mL + TF water  687 mL = 1487 mL total water  per day)  Will conduct follow up calorie count early next week to reassess intake and need for EN   NUTRITION DIAGNOSIS:  Moderate Malnutrition related to chronic illness (bladder cancer, tachycardia,  hx bowel perforation) as evidenced by moderate fat depletion, moderate muscle depletion, percent weight loss. Remains applicable  GOAL:  Patient will meet greater than or equal to 90% of their needs Progressing  MONITOR:  PO intake, Supplement acceptance, Weight trends  REASON FOR ASSESSMENT:  Consult Assessment of nutrition requirement/status, Calorie Count, Poor PO  ASSESSMENT:  Pt admitted to CIR with debility following most recent admission for fatigue, mental status changes, tachycardia, hypotension and purulent drainage from abdominal wall drain. PMH significant for HFpEF, CAD, T2DM, PE on eliquis , prostate cancer, bladder cancer s/p cystoprostatectomy and ileal conduit (2013), recent admission 04/10/24 for parastomal-incisional abdominal wall hernia repair w/graft and ileal conduit reversal c/b intra-abdominal and abdominal wall abscess from bowel perforation s/p end ileostomy.  Recent Admissions: 5/30-7/11: bowel perforation s/p SBR/end ileostomy 7/14-7/16: AKI, infection of ileostomy site 7/16-8/8: Admitted to CIR 8/20-8/30: Admitted to [facility]: AKI, severe sepsis  Current Admission: 8/30 admitted to CIR 9/3 MBS,  recommended DYS 3 w/ thins 9/14-9/15: calorie count; meeting 20-22% kcal and 13-21% protein needs PO 9/17 Cortrak placed  Spoke with pt and pt's wife at bedside. Pt appears more energized today and able to participate in conversation. Pt has reached goal rate for tube feeds and tolerating well. Pt reports no issues with abdominal pain, cramping, nausea or emesis. Ileostomy output appears appropriate. Discussed transitioning to nocturnal feeds so pt can be disconnected from feeds throughout the day, pt and wife agreeable. Will run feeds from 5pm-5am to help encouraged increased PO intake during the day. Pt reports appetite has been fair but feels like with his energy improving he may start to try to eat more. Discussed initiating a calorie count on Monday to monitor intake more closely after being on nocturnal feeds for a few days. Will continue to monitor pt's progress and make adjustments to feeding recommendations as needed.  Average Meal Completion: 8/30-9/2: 43% average intake x 6 recorded meals 9/1-9/5: 31% average intake x 8 recorded meals 9/8-9/14 23% intake x 13 recorded meals 9/16-9/18: 60% average intake x 6 recorded meals  Medications:  Scheduled Meds: Children's multivitamin BID Vitamin B12 500 mcg daily Imodium  TID Magnesium  gluconate 500 mg daily Reglan  TID Midodrine  TID Juven BID Fibercon BID Phos-Nak Potassium chloride  20 mEq Thiamine  100 mg  Continuous Infusions: Ceftriaxone  Linezolid   Oritavancin  Diphosphate  PRN Meds: alum & mag hydroxide-simeth, bisacodyl , diphenhydrAMINE , prochlorperazine   Labs:  Potassium 3.8<--3.3 (WNL) Phosphorus 1.6<--3.1 (low) Magnesium  1.7 (WNL) BUN 38, creatinine 1.57 GFR 47  Ileostomy: 450 ml x 24 hr Urostomy:  no documentation Nephrostomy (left): 350 mL out x 24 hours    Diet Order:   Diet Order             DIET DYS  3 Room service appropriate? Yes; Fluid consistency: Thin  Diet effective now                    EDUCATION NEEDS:  Education needs have been addressed  Skin: Reviewed RN Assessment MASD to the right buttocks (2.5 x 6 cm) Healing midline incision from surgery 7/22  Last BM:  450 mL ileostomy output x 24 hr  Height:  Ht Readings from Last 1 Encounters:  07/12/24 5' 11 (1.803 m)    Weight:  Wt Readings from Last 1 Encounters:  07/31/24 105 kg   BMI:  Body mass index is 32.29 kg/m.  Estimated Nutritional Needs:   Kcal:  2200-2400  Protein:  120-135g  Fluid:  >/=2L   Josette Glance, MS, RDN, LDN Clinical Dietitian I Please reach out via secure chat

## 2024-07-31 NOTE — Plan of Care (Signed)
  Problem: Consults Goal: RH GENERAL PATIENT EDUCATION Description: See Patient Education module for education specifics. Outcome: Progressing   Problem: RH BOWEL ELIMINATION Goal: RH STG MANAGE BOWEL WITH ASSISTANCE Description: STG Manage Bowel with supervision Assistance. Outcome: Progressing   Problem: RH BLADDER ELIMINATION Goal: RH STG MANAGE BLADDER WITH ASSISTANCE Description: STG Manage Bladder With supervision Assistance Outcome: Progressing   Problem: RH SKIN INTEGRITY Goal: RH STG SKIN FREE OF INFECTION/BREAKDOWN Description: Manage skin free of infection with supervision assistance Outcome: Progressing   Problem: RH SAFETY Goal: RH STG ADHERE TO SAFETY PRECAUTIONS W/ASSISTANCE/DEVICE Description: STG Adhere to Safety Precautions With Assistance/Device. Outcome: Progressing   Problem: RH PAIN MANAGEMENT Goal: RH STG PAIN MANAGED AT OR BELOW PT'S PAIN GOAL Description: <4 w/ prns Outcome: Progressing   Problem: RH KNOWLEDGE DEFICIT GENERAL Goal: RH STG INCREASE KNOWLEDGE OF SELF CARE AFTER HOSPITALIZATION Description: Manage increase knowledge of self care care after hospitalization with supervision assistance from wife using educational materials provided Outcome: Progressing

## 2024-07-31 NOTE — Progress Notes (Signed)
 Patient was able to tolerate PO medications. No further complaints reported. Will continue to monitor.

## 2024-07-31 NOTE — Progress Notes (Signed)
 CT A/P evaluated by Dr Ann who felt that fluid unremarkable but did wonder about the presence of bubbles present a month out. Patient without elevated WBC or fevers and on antibiotics. Discussed his decline, confusion and persistent hypotension.  She felt if all other sources of infection ruled out could consult IR non-emergently to drain it or put a drain. If serous of no concern but if purulent would culture.

## 2024-07-31 NOTE — Progress Notes (Addendum)
 PROGRESS NOTE   Subjective/Complaints:  Per wife, slept well overnight, ate a good amount of his breakfast this a.m.  Cognitively, is doing better than he has prior days. Continues to endorse chills, has had no clinical fevers, otherwise feeling okay. Vitals stable, labs continue to improve. LA borderline 1.9. CT abdomen pending results; no obvious changes on my review. Yesterday modA +2 to maintain standing balance for up to ~30 seconds each bout, with extended seated rest break.   Per nursing, right flank continues to drain a considerable amount, requires 2 dressing changes daily.  Continues to be thin, milky appearing fluid.  ROS: Patient denies fever, rash, sore throat, blurred vision, dizziness,  , vomiting, diarrhea, cough, shortness of breath or chest pain, joint or back/neck pain, headache, or mood change.  + Chills--ongoing  Objective:   DG CHEST PORT 1 VIEW Result Date: 07/30/2024 CLINICAL DATA:  Cough. EXAM: PORTABLE CHEST 1 VIEW COMPARISON:  Chest radiograph dated 07/27/2024. FINDINGS: Right-sided PICC with tip at the cavoatrial junction. Enteric tube extends below the diaphragm with tip beyond the inferior margin of the image. No focal consolidation, pleural effusion, pneumothorax. The cardiac silhouette is within limits no acute osseous pathology. IMPRESSION: 1. No active disease. 2. Right-sided PICC with tip at the cavoatrial junction. Electronically Signed   By: Vanetta Chou M.D.   On: 07/30/2024 19:56     Recent Labs    07/30/24 0406 07/31/24 0040  WBC 7.9 7.2  HGB 10.2* 9.7*  HCT 32.6* 30.6*  PLT 136* 104*    Recent Labs    07/30/24 0406 07/31/24 0040  NA 139 136  K 3.5 3.8  CL 103 105  CO2 23 22  GLUCOSE 97 103*  BUN 36* 38*  CREATININE 1.69* 1.57*  CALCIUM  8.6* 8.3*    Intake/Output Summary (Last 24 hours) at 07/31/2024 0854 Last data filed at 07/31/2024 0524 Gross per 24 hour  Intake 1530  ml  Output 800 ml  Net 730 ml        Physical Exam: Vital Signs Blood pressure 109/63, pulse 90, temperature (!) 97.3 F (36.3 C), temperature source Oral, resp. rate 18, height 5' 11 (1.803 m), weight 105 kg, SpO2 99%.   Constitutional: Chills, right lower extremity and bilateral upper extremity rigors on exam.  Sitting upright in reclining wheelchair. HEENT: NCAT, EOMI, oral membranes moist.  Core track in place. Neck: supple, no JVD. Cardiovascular: RRR without murmur.  1+ bilateral upper extremity and lower extremity edema. Respiratory/Chest: CTA Bilaterally without wheezes or rales. Normal efforrt.  Intermittent wet sounding cough. GI/Abdomen: BS +, non-tender, non-distended. Ostomy with liquid brown stool--moderate amount. Ext: no clubbing, cyanosis. Psych: Flat, but cooperative. Skin:  abdominal wound  with pink with granulation tissue, moist, covered in clean dressing. --Unchanged Left nephrostomy tube with bag seal in place.  R flank prior tube site continues to drain--some skin induration but no warmth, minimal drainage of clear fluid.  Urostomy with some sediment, mostly clear output.   Neuro:  alert but slow processing.  [name], hospital, and month and year; not oriented today.  Much improved from prior exams. Poor attention. -Improving Unable to perform complex problem-solving  Normal language and speech.  Cranial nerve exam unremarkable.  MMT: BUE 4/5 prox to distal. BLE 2-/5 HF, 3-KE and 4/5 ADF/PF--unchanged No limb ataxia or cerebellar signs.  Bilateral upper and lower extremity intention tremors--waxes and wanes No abnormal tone appreciated.   Musculoskeletal: Able to straighten both knees out on bed.  Plantarflexion tone bilateral lower extremities, barely able to range to neutral.   Assessment/Plan: 1. Functional deficits which require 3+ hours per day of interdisciplinary therapy in a comprehensive inpatient rehab setting. Physiatrist is  providing close team supervision and 24 hour management of active medical problems listed below. Physiatrist and rehab team continue to assess barriers to discharge/monitor patient progress toward functional and medical goals  Care Tool:  Bathing    Body parts bathed by patient: Chest, Face, Abdomen, Right arm, Left arm, Front perineal area, Right upper leg, Left upper leg   Body parts bathed by helper: Buttocks, Right lower leg, Left lower leg     Bathing assist Assist Level: Moderate Assistance - Patient 50 - 74%     Upper Body Dressing/Undressing Upper body dressing   What is the patient wearing?: Hospital gown only    Upper body assist Assist Level: Moderate Assistance - Patient 50 - 74%    Lower Body Dressing/Undressing Lower body dressing      What is the patient wearing?: Pants     Lower body assist Assist for lower body dressing: Maximal Assistance - Patient 25 - 49%     Toileting Toileting    Toileting assist Assist for toileting:  (patient has colosteomy and urostomy)     Transfers Chair/bed transfer  Transfers assist  Chair/bed transfer activity did not occur: Safety/medical concerns  Chair/bed transfer assist level: Maximal Assistance - Patient 25 - 49%     Locomotion Ambulation   Ambulation assist   Ambulation activity did not occur: Safety/medical concerns (fatigue/dizziness/orthostatics)          Walk 10 feet activity   Assist  Walk 10 feet activity did not occur: Safety/medical concerns (fatigue/dizziness/orthostatics)        Walk 50 feet activity   Assist Walk 50 feet with 2 turns activity did not occur: Safety/medical concerns (fatigue/dizziness/orthostatics)         Walk 150 feet activity   Assist Walk 150 feet activity did not occur: Safety/medical concerns (fatigue/dizziness/orthostatics)         Walk 10 feet on uneven surface  activity   Assist Walk 10 feet on uneven surfaces activity did not occur:  Safety/medical concerns (fatigue/dizziness/orthostatics)         Wheelchair     Assist Is the patient using a wheelchair?: Yes Type of Wheelchair: Manual           Wheelchair 50 feet with 2 turns activity    Assist    Wheelchair 50 feet with 2 turns activity did not occur: Safety/medical concerns       Wheelchair 150 feet activity     Assist  Wheelchair 150 feet activity did not occur: Safety/medical concerns       Blood pressure 109/63, pulse 90, temperature (!) 97.3 F (36.3 C), temperature source Oral, resp. rate 18, height 5' 11 (1.803 m), weight 105 kg, SpO2 99%. Medical Problem List and Plan: 1. 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  Undergoing medical workups as below, will monitor improvement for the next couple of days --address at teams if current level of care exceeds ability of home caregivers.  - 9-13: Bilateral lower extremity PRAFO's in bed for plantarflexion/tight heel cords  --Continue CIR therapies including PT, OT, and SLP. Progress has been very slow. May need to consider other discharge venue   - 9/16: Declining with therapies; pleasantly confused today, interactions much less withdrawn but nonsensical. Will need Mod-Max A at home. Concern for physical decline exceeding family ability for caregiving if no improvements moving forward.   - 9-17 waiting on calorie counts today to determine if feeding tube needed; if patient/family continue to refuse, would recommend goals of care discussion and possible palliative care consult--patient opted for placement of tube feed  - 9-18: Due to ongoing significant support needed to maintain blood pressure, as well as chills today, discussed  additional workup/treatment with infectious disease; recommend against treatment of yeast at this time given likely contamination, given ongoing decline will get lactic acid and repeat CT abdomen and pelvis without contrast in a.m--lactic acid upper limit of normal 1.9, CT abdomen and pelvis results pending  2.  H/o PE/Antithrombotics: -DVT/anticoagulation:  Pharmaceutical: Eliquis              -antiplatelet therapy: N/A  - No further blood appreciated in nephrostomy tubes  3. Pain Management: Oxycodone  prn.    - 9/16: Oxycodone  was removed yesterday due to concerns of this contributing to PM confusion and hypotension; nursing reports family in disagreement with this and patient pain increased. Adjust to Tylenol  PRN for mild to moderate, Percocet 5 mg for severe pain to avoid undertreatment. Monitor BP VERY closely  9-18: Has not required Percocet again since 9-15  4. Mood/Behavior/Sleep: LCSW to follow for evaluation and support.              --melatonin for insomnia.              -antipsychotic agents: N/A  9-14: Per patient, sleeping well.  Sleep log.  Discussed delirium management with patient's wife  - 9-18 sleep log remains appropriate, no apparent hallucinations or delusions.  Continue current management  5. Neuropsych/cognition/encephalopathy likely secondary to metabolic acidosis vs delirium from prolonged illness/hospitalization: This patient is not capable of making decisions on his own behalf.  - 9-11: Increasingly withdrawn, behavioral changes may be secondary to poor nutritional intake and hyponatremia.  Treating as below, on sertraline  100 mg daily, may need to add additional agent such as mirtazapine but hesitant given ongoing cognitive issues.  9-12: Slightly better today, but consistent with hypoactive delirium with primary difficulty with concentration, no focal neurologic deficits.  Start overnight sleep log, delirium precautions.  Suspect hyponatremia as primary contributor,  management as below.  9/13: D3C Flexeril , Megace , Zoloft  due to ongoing cognitive changes.  Sodium is improving.  Get LFTs, ammonia today to evaluate ongoing cognitive deficits--within normal limits.  -14: Cognition seems to be clearing a bit, continue current regimen.   9/15 alert but delayed. Continue to maximize renal/volume/nutritional status 9/16: Much more alert, but delirious; sleep log appears appropriate. Will add Zyprexa  5 mg PRN for aggitation d/t increased aggitation yesterday afternoon. Keep low dose / low likelihood of Qtc prolongation medications only.   9-18: Cognition improving very gradually, QTc remains prolonged.  6. Skin/Wound Care: Routine pressure relief  measures. Encourage adequate diet             --dressing changes daily. Gerhardt's cream to buttock wound.   -abdominal wound continues to close despite tenous nutritional status 7. Fluids/Electrolytes/Nutrition/oropharyngeal dysphagia/moderate to severe protein calorie malnutrition:                --9/10 periactin  yesterday stopped d/t lethargy   -will try megace  beginning today   -RD f/u   -push fluids   -recheck bmet in am   -continue juven bid. Has been refusing Ensure supplement.   -kdur 20meq daily   -can't use zofran  d/t QTc prolongation (490)  - MBS   9/3--D3/thins, prealbumin borderline normal at 20   - 9-11: Nausea remains a primary barrier to adequate p.o. intakes.  Recheck EKG today, QTc was 460, scheduled Zofran  4 mg IV 3 times daily AC.  Will repeat EKG in next 1 to 2 days.  - 9-12: Albumin  infusion per nephrology.  P.o. intakes improving with Zofran .  per IV team midline contraindicated due to bicarb.  May get PICC line once dialysis no longer considered.  9-14: P.o. intakes okay, Zofran  continues to help with nausea.  Per nephrology, okay to reduce fluids, will keep on 70 cc/h normal saline for maintenance for now.  PICC line order placed.  9/15 intake 20-30%. Receiving mtc IVF.    -k+ 3.3, mg++  1.3   -continue kdur 20meq daily and add 40meq to IVF   -IV mag sulfae 2g x1, add mag gluconate oral daily    9/16: Mag, K repleted. PO intakes remain very poor. Albumin  2.0. Continue maintenance fluids--will likely need TF.   9/17: Ad phosnak 1 packet x3 doses for repletion of K and Phos; repeat in AM--nephrology changed to IV--repleted 9-18  8. Sepsis/Abdominal wall/mesh infection: IV Vanc/Ceftriaxone  for 6 weeks from 08/21             --Probable 6 months total of antibiotics--ID follow up 09/18 @10  am --Corynebacterium species-->6 more weeks ?Dalbavancin for OP use, cont ceftriazone and linezolid  for now  per ID -appreciate recent ID f/u CRP nl 0.8 ESR 44 WBC 8.2 9-11: WBC labs remained stable. 9/15 wbc 7.1 9/16: Discussed with Dr. Dennise ID possibility of encephalopathy with current antibiotics, as well as +UA yesterday. Recommendation to continue current broad spectrum antibiotics as they are unlikely contributors and regimen has already been altered from vancomycin . Given some yeast in UA will await culture to see if secondary fungal infection present vs contaminant. -pending 9/17 9/18; per Dr. Dennise, would not treat yeast is likely contaminated with no WBC elevations and no fevers.  Given symptomatic chills and significant blood pressure support ongoing, concern for untreated/undertreated infectious etiology in setting of adrenal insufficiency, will get CT abdomen and pelvis and lactic acid in a.m. for further assessment--LA 1.9, CT abdomen and pelvis results pending 9-19  9. Left> right hydronephrosis: Nephrostomy tubes with stent exchanged on 08/27 --continue clamping right tube and left to drain per urology.  Monitor for blood recheck CBC in am --hemoglobin stable, no further bloody output appreciated -9/15 flush left nephrostomy tube 9-18: Prior right tube site continues to drain, CT abdomen and pelvis as above  10. Acute on chronic renal failure/hyponatremia:  elevated CR- baseline  creat probably ~1.4  -9/10 will give addnl IVF today d/t poor po intake   -recheck bmet in am  - 9-11: AKI significantly worse, now with hyponatremia 131.  Will give 1 L fluid bolus normal saline today,  recheck BMP; given brisk uptrend despite increase in IV fluids will consult nephrology for further recommendations.  - 9-12: Appreciate nephrology input and recommendations.  Responding slightly to increased IV fluids overnight, normal saline 125/h.  Hyponatremia remains 131. On multiple medications that could be contributing including Florinef , sertraline , and linezolid .  See their note for specific recommendations; advised to remain on fluids for primary treatment of hyponatremia, do not recommend salt tabs at this time.  - If renal function does not improve with fluids may need CT scan of the abdomen and pelvis to verify patency of nephrostomy/ileal conduit drainage   9-13: Potassium chloride  packet 40 mill equivalents ordered this a.m. for repletion.  Sodium improving, bicarb improving, but renal function remains unchanged on bicarb drip.  9-14: Renal function improved, acidosis resolved, sodium 134.  Per nephrology, can DC fluids, but agreeable to maintenance fluids given ongoing poor p.o. intakes.  Continue to monitor closely.  9/15-17 Cr continues to improve gradually, continue IVF, push po  9/18: On TF, DC IVF, move to [facility]WF 200 ccs QID--monitor closely  9-19: Seems to be tolerating free water  flushes well, continuing to improve.  Continue through this weekend.       11. OSA: Encourage CPAP use 12. Hypotension/tachycardia/?adrenal insufficiency: Continue Midodrine  15 mg TID and Florinef  2 mg daily. Plasma cortisol 13.6 6/25/             --continues to have significant tachycardia with activity likely due to orthostasis and deconditioning.  - -hgb 11.5   -continue metoprolol  low dose 12.5mg  bid, bp remains soft but stable at present   -  IVF as above Per PT- has been not dropping in therapy    9-13: Orthostatic today, DC Lopressor  and increase midodrine  to 20 mg 3 times daily.  Has been getting a significant amount of IV fluid last few days, still likely volume depleted per nephrology.  9-14: Blood pressure remains stable, but soft. 9/15 hypotensive this morning.  Continue IVF and plan as above 9/16: A bit improved today; continue IVF, no signs of volume overload. If profoundly hypotensive again may need to transfer to acute given maximizing fluids/BP support without pressors.  9-18: DC IV fluid as above today; monitor blood pressures very closely, seem more stable than they were last week.  On chart review, was minimally responsive to trial of high-dose steroids in August; does not look like cosyntropin stim test was ever performed; Continue Florinef  for now, may need to consider if blood pressures do not improve 9-19: Blood pressure holding up in the low 100s, continue current regimen.  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    9/1 held reglan   -9/16: resume reglan  given low OP, nausea--with benefit, output remains low   - 9/18: Ostomy output picking up considerably with tube feeds; on fibercon BID; appreciate nutrition input  14. Depression: On Zoloft -- Dced 9/13 due to hyponatremia and confusion   - 9/16: Family concerned for serotonin syndrome with zoloft +linezolid - no tachycardia, fevers, seizures, or severe agitation reported - encephalopathy has not improved with removal of zoloft . Low suspicion at this time.   15. Malnutrition/: Albumin  @1 .9/TP-5.1.  --> 2.2 9/11;  - 9-11: Adding Premeal Zofran  as above.  Will get nutrition to start calorie count; may need additional supplementation or discussion of feeding tube if does not improve.  - 9-12: Slightly improved, continue this regimen.  9-13: Very poor p.o. intakes yesterday.  DC Megace  due to possible cognitive effects, no perceived benefit.  Will discuss  possible feeding tube with family--patient rejects, will  get calorie count for 48 hours and if no improvement may readdress tube feed versus palliative consult  9-14: Wife encouraging p.o.'s, patient motivated to avoid feeding tube.  Calorie count started.  Maintenance fluids as above.  9/16: Qtc 519; DC zofran , resume relgan 5 mg TID AC as he did well with this in the past and ostomy output has slowed considerably  9-17: QTc remains prolonged; will not resume Zofran  at this time.  Patient agreeable to placement of core track and discussed with nutrition starting with nocturnal tube feeds for supplementation, to allow appetite during the day to continue to work on increasing p.o.'s.  Will give one-time Xanax  to assist in anxiety with core track placement.  9-18: Tolerating tube feeds well, labs looking stable.  P.o. intakes poor this a.m. due to timing of therapy/medication/breakfast, continue to encourage as primary form of nutrition.  9-19: Tolerating overnight tube feeds well, p.o. intakes during the day complicated by gagging/coughing due to tube feed placement.  Chest x-ray yesterday was negative.  Orders to maintain head of bed 30 degrees whether or not tube feeds are running, and viscous lidocaine  4 times daily as needed for sensation of gagging.   16. Anemia of chronic illness: Stable overall 9/8-11  - Hemoglobin stable 9-10       Latest Ref Rng & Units 07/31/2024   12:40 AM 07/30/2024    4:06 AM 07/27/2024    2:29 AM  CBC  WBC 4.0 - 10.5 K/uL 7.2  7.9  7.1   Hemoglobin 13.0 - 17.0 g/dL 9.7  89.7  89.3   Hematocrit 39.0 - 52.0 % 30.6  32.6  32.9   Platelets 150 - 400 K/uL 104  136  215     17.  Thrombocytopenia.  Mild, 136 today.  Monitor very closely in setting of linezolid .   - 9/19: 104; per Dr. Dennise likely adjusting linezolid  to orvitatancin starting Monday.  Continue to trend  18.  Hypophosphatemia.  1.3 to 3.1 with IV repletion, next day 1.6.  - 9-19: Per electrolyte protocol, start Phos-Nak Powder via tube feed 2 packets 4 times daily  for 5 doses  - Repeat daily.  LOS: 20 days A FACE TO FACE EVALUATION WAS PERFORMED  Joesph JAYSON Likes 07/31/2024, 8:54 AM

## 2024-07-31 NOTE — Progress Notes (Signed)
 A CHG bath was given to this patient.

## 2024-07-31 NOTE — Progress Notes (Signed)
 [facility] NEPHROLOGY PROGRESS NOTE  Assessment/ Plan: Pt is a 73 y.o. 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  # Acute kidney injury on CKD IIIA:  - Likely hemodynamically mediated with hypotension and decreased oral intake.   - predisposed to pre-renal injury with ostomy - cortrak placed; doing better with initiation of feeds  - His family and primary team has been concerned about very poor oral intake    # Nausea - he is having nausea on his current regimen - perhaps would reduce the rate of tube feeds.  He is on nocturnal feeds at what seems to be a high rate - will reduce to 50 ml/hr overnight for now and then defer to primary team  # Decreased responsiveness/AMS - improved - concurrent with BP and fluids  - s/p NS bolus 500 mL once on 9/15 and increase in rate of NS back to 75 ml/hr - note he has been on ceftriaxone  and linezolid    - abx per primary team     # Hyponatremia: Sodium level improving with fluids.    # Metabolic acidosis, improved and now off of bicarb  # Sepsis due to abdominal wall infection on antibiotics per primary team.  # Hypokalemia:  - Reduced potassium chloride  to 10 meq PO daily.  He is frequently getting potassium phosphate  - note hx Hypomagnesemia which was repleted.   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  Disposition - per primary team. Nephrology will sign off.  Please do not hesitate to contact me with any questions regarding the patient.       Subjective:  Suspect all ins/outs not recorded.  He had 350 mL UOP charted over 9/18.  He has been  getting nocturnal feeds.      Review of systems:      Denies shortness of breath; denies chest pain More nausea today     Objective Vital signs in last 24 hours: Vitals:   07/30/24 2013 07/31/24 0229 07/31/24 0439 07/31/24 1504  BP: 114/76  109/63 105/71  Pulse: 84  90 88  Resp: 18  18 18   Temp: 97.6 F (36.4 C)  (!) 97.3 F (36.3 C) 97.7 F (36.5 C)  TempSrc:   Oral   SpO2: 99%  99% 100%  Weight:  105 kg    Height:       Weight change: 0 kg  Intake/Output Summary (Last 24 hours) at 07/31/2024 1900 Last data filed at 07/31/2024 1825 Gross per 24 hour  Intake 1300 ml  Output 1260 ml  Net 40 ml       Labs: RENAL PANEL Recent Labs  Lab 07/26/24 0529 07/27/24 0229 07/28/24 0250 07/29/24 0232 07/29/24 1524 07/30/24 0406 07/31/24 0040  NA 134* 136  133* 138 138  --  139 136  K 3.0* 3.3*  3.2* 3.5 3.3*  --  3.5 3.8  CL 97* 97*  94* 102 104  --  103 105  CO2 26 27  29 25 23   --  23 22  GLUCOSE 82 91  92 102* 88  --  97 103*  BUN 49* 43*  44* 38* 32*  --  36* 38*  CREATININE 2.14* 1.98*  1.98* 2.01* 1.78*  --  1.69* 1.57*  CALCIUM  9.4 8.8*  8.8* 8.6* 8.5*  --  8.6* 8.3*  MG  --  1.3* 1.9 1.7  --   --   --   PHOS 2.7 2.4* 2.5 1.9* 1.3* 3.1 1.6*  1.6*  ALBUMIN  2.5* 2.2* 2.0* 1.9*  --   --  1.8*    Liver Function Tests: Recent Labs  Lab 07/25/24 1318 07/26/24 0529 07/28/24 0250 07/29/24 0232 07/31/24 0040  AST 15  --   --   --   --   ALT 13  --   --   --   --   ALKPHOS 73  --   --   --   --   BILITOT 0.4  --   --   --   --   PROT 5.5*  --   --   --   --   ALBUMIN  2.8*   < > 2.0* 1.9* 1.8*   < > = values in this interval not displayed.   No results for input(s): LIPASE, AMYLASE in the last 168 hours. Recent Labs  Lab 07/25/24 1318  AMMONIA 21   CBC: Recent Labs    05/02/24 0515 05/04/24 0341 07/23/24 0430 07/25/24 1318 07/27/24 0229 07/30/24 0406 07/31/24 0040  HGB  --    < > 11.3* 10.9* 10.6* 10.2* 9.7*  MCV  --    < > 82.4  80.1 82.7 85.3 84.3  FERRITIN 588*  --   --   --   --   --   --   TIBC 281  --   --   --   --   --   --   IRON  54  --   --   --   --   --   --    < > = values in this interval not displayed.    Cardiac Enzymes: No results for input(s): CKTOTAL, CKMB, CKMBINDEX, TROPONINI in the last 168 hours. CBG: Recent Labs  Lab 07/31/24 0004 07/31/24 0435 07/31/24 0839 07/31/24 1214 07/31/24 1624  GLUCAP 129* 106* 108* 127* 99    Iron  Studies: No results for input(s): IRON , TIBC, TRANSFERRIN, FERRITIN in the last 72 hours.   Studies/Results: CT ABDOMEN PELVIS WO CONTRAST Result Date: 07/31/2024 CLINICAL DATA:  Sepsis EXAM: CT ABDOMEN AND PELVIS WITHOUT CONTRAST TECHNIQUE: Multidetector CT imaging of the abdomen and pelvis was performed following the standard protocol without IV contrast. RADIATION DOSE REDUCTION: This exam was performed according to the departmental dose-optimization program which includes automated exposure control, adjustment of the mA and/or kV according to patient size and/or use of iterative reconstruction technique. COMPARISON:  07/02/2024 FINDINGS: Lower chest: Small free-flowing bilateral pleural effusions with dependent lower lobe atelectasis. Hepatobiliary: Prior cholecystectomy. Unremarkable unenhanced appearance of the liver. Pancreas: Stable pancreatic parenchymal atrophy with coarse pancreatic calcifications consistent with sequela of chronic calcific pancreatitis. No acute inflammatory changes. Spleen: Unremarkable unenhanced appearance. Adrenals/Urinary Tract: There are bilateral percutaneous nephrostomy tubes again identified, coiled within the bilateral renal pelves. A right ureteral stent extends from the right renal pelvis into the right lower quadrant urostomy. Mild bilateral hydronephrosis is identified, stable on the right but improved on the left since prior exam. The adrenals are unremarkable. Prior cystectomy with right lower quadrant urinary  diversion again identified. Stomach/Bowel: No bowel obstruction or ileus. Distal colonic diverticulosis without evidence of acute diverticulitis. No bowel wall thickening or inflammatory change. Enteric catheter  is identified, tip within the proximal duodenum. Vascular/Lymphatic: Aortic atherosclerosis. No enlarged abdominal or pelvic lymph nodes. Reproductive: Prior prostatectomy. Other: No free fluid or free intraperitoneal gas. Interval removal of the percutaneous drain within the right lower quadrant abdominal wall adjacent to the diverting urostomy. Punctate foci of gas are seen within the fluid collection within the right ventral pelvic wall, with no appreciable change change in the size of the fluid collection since prior study. Please correlate with the timing of the drain removal, as this could explain the increased gas within the collection. Otherwise, infection could be considered. Stable fat containing right inguinal hernia.  No bowel herniation. Musculoskeletal: No acute or destructive bony abnormalities. Reconstructed images demonstrate no additional findings. IMPRESSION: 1. Stable bilateral percutaneous nephrostomy tubes and right ureteral stent. Mild bilateral hydronephrosis, stable on the right and improved on the left since prior exam. 2. Right ventral pelvic wall fluid collection, not appreciably changed in size since prior study. There are increased punctate foci of gas within the fluid collection, which could be due to recent drain removal or superimposed infection. 3. Distal colonic diverticulosis without diverticulitis. 4. Small bilateral pleural effusions and dependent lower lobe atelectasis. 5.  Aortic Atherosclerosis (ICD10-I70.0). Electronically Signed   By: Ozell Daring M.D.   On: 07/31/2024 16:36   DG CHEST PORT 1 VIEW Result Date: 07/30/2024 CLINICAL DATA:  Cough. EXAM: PORTABLE CHEST 1 VIEW COMPARISON:  Chest radiograph dated 07/27/2024. FINDINGS: Right-sided PICC with tip at the  cavoatrial junction. Enteric tube extends below the diaphragm with tip beyond the inferior margin of the image. No focal consolidation, pleural effusion, pneumothorax. The cardiac silhouette is within limits no acute osseous pathology. IMPRESSION: 1. No active disease. 2. Right-sided PICC with tip at the cavoatrial junction. Electronically Signed   By: Vanetta Chou M.D.   On: 07/30/2024 19:56     Medications: Infusions:  cefTRIAXone  (ROCEPHIN )  IV 2 g (07/30/24 2259)   Oritavancin  Diphosphate (ORBACTIV ) 1,200 mg in dextrose  5 % IVPB 1,200 mg (07/31/24 1608)    Scheduled Medications:  apixaban   5 mg Oral BID   childrens multivitamin  1 tablet Oral BID   Chlorhexidine  Gluconate Cloth  6 each Topical Q12H   cyanocobalamin   500 mcg Oral Q M,W,F   feeding supplement (PROSource TF20)  60 mL Per Tube BID   feeding supplement (VITAL 1.5 CAL)  1,000 mL Per Tube Q24H   fludrocortisone   0.2 mg Oral Daily   free water   200 mL Per Tube Q6H   Gerhardt's butt cream  1 Application Topical BID   leptospermum manuka honey  1 Application Topical Daily   loperamide   4 mg Oral TID   magnesium  gluconate  500 mg Oral Daily   melatonin  3 mg Oral QHS   metoCLOPramide   5 mg Oral TID AC   midodrine   20 mg Oral TID WC   nutrition supplement (JUVEN)  1 packet Per Tube BID BM   omega-3 acid ethyl esters  1 g Oral QODAY   polycarbophil  625 mg Oral BID   potassium & sodium phosphates   2 packet Per Tube Q4H   potassium chloride   20 mEq Oral Daily   rosuvastatin   10 mg Oral QPM   sodium chloride  flush  10-40 mL Intracatheter Q12H   thiamine   100 mg Per Tube Daily    have reviewed scheduled and prn medications.  Physical Exam:        General elderly male in bed in no acute distress  HEENT normocephalic atraumatic extraocular movements intact sclera anicteric Neck supple trachea midline Lungs clear to auscultation bilaterally normal work of breathing at rest on room air Heart S1S2 no rub Abdomen soft  nontender nondistended; left nephrostomy bag; ostomy in place Extremities no edema appreciated Psych blunted affect  Neuro awake and oriented to person, location; states year is 2026 then 2025    Kristopher Thompson 07/31/2024, 7:16 PM  LOS: 20 days

## 2024-07-31 NOTE — Progress Notes (Signed)
 Occupational Therapy Session Note  Patient Details  Name: Kristopher Thompson MRN: [id number] Date of Birth: [birth year]  Today's Date: 07/31/2024 OT Individual Time: 1120-1200 OT Individual Time Calculation (min): 40 min  and Today's Date: 07/31/2024 OT Missed Time: 20 Minutes Missed Time Reason: Nursing care   Short Term Goals: Week 3:  OT Short Term Goal 1 (Week 3): Pt will maintain static sitting balance with min A OT Short Term Goal 2 (Week 3): Pt will complete sit > stand from elevated surface with mod A OT Short Term Goal 3 (Week 3): Pt will complete bed mobility rolling R and L with min A to reduce burden of care for wife  Skilled Therapeutic Interventions/Progress Updates:    First 20 min of session missed d/t nursing care. Pt was taken via w/c to the therapy gym for time management. No c/o pain and agreeable to session. Worked on sit <> stands to address trunk control, LE strengthening, and generalized conditioning for carryover to ADL transfers. He required heavy max A to stand and then was able to maintain static stand for 10-20 seconds max over 4 trials. Used RW for first 3 trials and then switched to eva walker. He progressed to completing 10 ft of functional mobility with the Elyn Finder with heavy BUE support and max +2 assist, as well as 3rd person for close w/c follow. Patient required increased time for initiation, cuing, rest breaks, and for completion of tasks throughout session. Utilized therapeutic use of self throughout to promote efficiency. Pt returned to their room following. Pt was left sitting up in the wheelchair with all needs met and call bell within reach.   Therapy Documentation Precautions:  Precautions Precautions: Fall (HOH, Urostomy, Colostomy, PCN  drain, and orthostatic hypotension.) Recall of Precautions/Restrictions: Impaired Precaution/Restrictions Comments: urostomy, colostomy, lt PCN drain, watch BP and HR Restrictions Weight Bearing Restrictions Per  Provider Order: No  Therapy/Group: Individual Therapy  [name] 07/31/2024, 8:32 AM

## 2024-07-31 NOTE — Progress Notes (Addendum)
 [facility]  Date of Admission:  07/11/2024    Principal Problem:   Critical illness myopathy Active Problems:   Cognitive and behavioral changes   Malnutrition of moderate degree          Assessment: 73 year old male with abdominal wall/mesh infection: #Abdominal wall abscess from prior perforation status post multiple procedures #JP drain for abdominal wall cultures growing MDRO ecoli and corynebacterium species #AKI Procedures: 5/30 parastomal and incisional incarcerated abd wall hernia --> LOA; parastomal hernia repair with mesh; urostomy ileal conduit revision 6/09 perforated bowel --> drainage of right abd wall abscess and intraabd abscess; explantation of mesh; small bowel resection in continuity; placement of wound vac 6/10 delayed bowel perforation with open abd --> LOA; abd wall I&D; ileal resection; end ileostomy; abd wall parastomal/incisional hernia repair with phasix mesh (dissolvable in 1 year); fascia closure and wound vac placement 6/23 bilateral nephrostomy tube placement 6/30 bilateral nephrostomy tube exchange 7/02 right ureteral stent placement       Current Admission Cx: 8/20 blood cx negative   8/21 Jp drain from the abd wall cx - Ecoli (R amp, cefazolin , cipro , bactrim ; I amp/sulb; S ceftriaxone , piptazo), Corynebacterium species   8/21 urine cx mrsa (S tetra; R bactrim ), e faecium (V sensitive)  -Plan was to do 6 weeks of vancomycin  and ceftriaxone  with transition to dalbavancin versus linezolid /tedizolid and ceftriaxone  and minocycline pending sensitivities. - Patient developed worsening renal function vancomycin  transition to linezolid . Update: -Ecoli with omada MIC 1, Sensitivities for corynebacterium from cultures on 8/21 have not been sent.  Recommendations: -Continue linezolid  and ceftriaxone . Transition linezolid  to oritavancn on Monday(completed linezolid  x 2 weeks). then continue oritavancin  and  ceftriaxone  till 10/2.  After which can do 6 more weeks on omdada+dalvance/tedezolid. Communicated with primary.  -As repeat CT abdomen pelvis relatively unchanged, no plans to change in antibiotics.  There is punctuate foci of gas within the fluid collection which could be due to drain removal or superimposed infection.  -Reviewed plan, concern for hypotension, confusion: communicated with Dr. Ann if all other sources of infection ruled out possibly consult IR for aspiration.  Does not appear infectious in etiology from ID perspective(no fever, leukocytosis, overall stable imaging).   Would recommend repeat imaging prior to stopping antibiotics IV antibiotics.   -  Urine cultures 9/15 likely obtained from nephrostomy, unclear clinical significance.  Would not treat the yeast.  No concern for infection in the urinary tract seen on imaging.. pt remains afebrile, no leukocytosis.  - I had discussed with patient given that mesh is in place will need suppression antibiotics. -Contact precautions  Dr. Ula is covering this weekend.  New ID team on Monday. Microbiology:   Antibiotics: Linezolid  9/6-present Ceftriaxone  Cultures: Blood  Urine  Other   SUBJECTIVE: Sitting in chair wife at bedside Interval: Afebrile overnight.   Review of Systems: Review of Systems  All other systems reviewed and are negative.    Scheduled Meds:  apixaban   5 mg Oral BID   childrens multivitamin  1 tablet Oral BID   Chlorhexidine  Gluconate Cloth  6 each Topical Q12H   cyanocobalamin   500 mcg Oral Q M,W,F   feeding supplement (PROSource TF20)  60 mL Per Tube BID   feeding supplement (VITAL 1.5 CAL)  1,000 mL Per Tube Q24H   fludrocortisone   0.2 mg Oral Daily   free water   200 mL Per Tube Q6H   Gerhardt's butt cream  1 Application  Topical BID   leptospermum manuka honey  1 Application Topical Daily   loperamide   4 mg Oral TID   magnesium  gluconate  500 mg Oral Daily   melatonin  3 mg Oral QHS    metoCLOPramide   5 mg Oral TID AC   midodrine   20 mg Oral TID WC   nutrition supplement (JUVEN)  1 packet Per Tube BID BM   omega-3 acid ethyl esters  1 g Oral QODAY   polycarbophil  625 mg Oral BID   potassium & sodium phosphates   2 packet Per Tube Q4H   [START ON 08/01/2024] potassium chloride   10 mEq Oral Daily   rosuvastatin   10 mg Oral QPM   sodium chloride  flush  10-40 mL Intracatheter Q12H   thiamine   100 mg Per Tube Daily   Continuous Infusions:  cefTRIAXone  (ROCEPHIN )  IV 2 g (07/30/24 2259)   PRN Meds:.acetaminophen , bisacodyl , diphenhydrAMINE , guaiFENesin -dextromethorphan, hydrOXYzine , iohexol , ipratropium, lidocaine , melatonin, OLANZapine  zydis, mouth rinse, oxyCODONE -acetaminophen , prochlorperazine  **OR** prochlorperazine  **OR** prochlorperazine , sodium chloride , sodium chloride  flush Allergies  Allergen Reactions   Demerol  [Meperidine ] Nausea And Vomiting    OBJECTIVE: Vitals:   07/31/24 0229 07/31/24 0439 07/31/24 1504 07/31/24 1914  BP:  109/63 105/71 102/66  Pulse:  90 88 100  Resp:  18 18 18   Temp:  (!) 97.3 F (36.3 C) 97.7 F (36.5 C) 98.3 F (36.8 C)  TempSrc:  Oral  Oral  SpO2:  99% 100% 100%  Weight: 105 kg     Height:       Body mass index is 32.29 kg/m.  Physical Exam Constitutional:      General: He is not in acute distress.    Appearance: He is normal weight. He is not toxic-appearing.  HENT:     Head: Normocephalic and atraumatic.     Right Ear: External ear normal.     Left Ear: External ear normal.     Nose: No congestion or rhinorrhea.     Mouth/Throat:     Mouth: Mucous membranes are moist.     Pharynx: Oropharynx is clear.  Eyes:     Extraocular Movements: Extraocular movements intact.     Conjunctiva/sclera: Conjunctivae normal.     Pupils: Pupils are equal, round, and reactive to light.  Cardiovascular:     Heart sounds: No murmur heard.    No friction rub. No gallop.  Pulmonary:     Effort: Pulmonary effort is normal.      Breath sounds: Normal breath sounds.  Musculoskeletal:        General: No swelling.     Cervical back: Normal range of motion and neck supple.  Skin:    General: Skin is warm and dry.  Neurological:     General: No focal deficit present.     Mental Status: He is oriented to person, place, and time.  Psychiatric:        Mood and Affect: Mood normal.       Lab Results Lab Results  Component Value Date   WBC 7.2 07/31/2024   HGB 9.7 (L) 07/31/2024   HCT 30.6 (L) 07/31/2024   MCV 84.3 07/31/2024   PLT 104 (L) 07/31/2024    Lab Results  Component Value Date   CREATININE 1.57 (H) 07/31/2024   BUN 38 (H) 07/31/2024   NA 136 07/31/2024   K 3.8 07/31/2024   CL 105 07/31/2024   CO2 22 07/31/2024    Lab Results  Component Value Date   ALT 13  07/25/2024   AST 15 07/25/2024   ALKPHOS 73 07/25/2024   BILITOT 0.4 07/25/2024        Kristopher Stank, MD [facility] 07/31/2024, 11:30 PM Evaluation of this patient requires complex antimicrobial therapy evaluation and counseling + isolation needs for disease transmission risk assessment and mitigation

## 2024-08-01 DIAGNOSIS — E440 Moderate protein-calorie malnutrition: Secondary | ICD-10-CM

## 2024-08-01 DIAGNOSIS — I959 Hypotension, unspecified: Secondary | ICD-10-CM

## 2024-08-01 DIAGNOSIS — R790 Abnormal level of blood mineral: Secondary | ICD-10-CM

## 2024-08-01 LAB — GLUCOSE, CAPILLARY
Glucose-Capillary: 100 mg/dL — ABNORMAL HIGH (ref 70–99)
Glucose-Capillary: 106 mg/dL — ABNORMAL HIGH (ref 70–99)
Glucose-Capillary: 118 mg/dL — ABNORMAL HIGH (ref 70–99)
Glucose-Capillary: 119 mg/dL — ABNORMAL HIGH (ref 70–99)
Glucose-Capillary: 84 mg/dL (ref 70–99)
Glucose-Capillary: 91 mg/dL (ref 70–99)

## 2024-08-01 LAB — MAGNESIUM: Magnesium: 1.5 mg/dL — ABNORMAL LOW (ref 1.7–2.4)

## 2024-08-01 LAB — PHOSPHORUS: Phosphorus: 1.3 mg/dL — ABNORMAL LOW (ref 2.5–4.6)

## 2024-08-01 MED ORDER — MAGNESIUM SULFATE 2 GM/50ML IV SOLN
2.0000 g | Freq: Once | INTRAVENOUS | Status: AC
  Administered 2024-08-01: 2 g via INTRAVENOUS
  Filled 2024-08-01: qty 50

## 2024-08-01 MED ORDER — POTASSIUM PHOSPHATES 15 MMOLE/5ML IV SOLN
15.0000 mmol | Freq: Once | INTRAVENOUS | Status: AC
  Administered 2024-08-01: 15 mmol via INTRAVENOUS
  Filled 2024-08-01: qty 5

## 2024-08-01 NOTE — Progress Notes (Addendum)
 PROGRESS NOTE   Subjective/Complaints: Patient noted to have some nausea this morning.  Feels okay otherwise.  Yesterday IR was consulted for drain, per Dr. Jenna no aspiration or drainage planned for now.   ROS: Patient denies fever, rash, sore throat, blurred vision, dizziness,  , vomiting, diarrhea, cough, shortness of breath or chest pain, joint or back/neck pain, headache, or mood change.  + Chills--ongoing + nausea  Objective:   CT ABDOMEN PELVIS WO CONTRAST Result Date: 07/31/2024 CLINICAL DATA:  Sepsis EXAM: CT ABDOMEN AND PELVIS WITHOUT CONTRAST TECHNIQUE: Multidetector CT imaging of the abdomen and pelvis was performed following the standard protocol without IV contrast. RADIATION DOSE REDUCTION: This exam was performed according to the departmental dose-optimization program which includes automated exposure control, adjustment of the mA and/or kV according to patient size and/or use of iterative reconstruction technique. COMPARISON:  07/02/2024 FINDINGS: Lower chest: Small free-flowing bilateral pleural effusions with dependent lower lobe atelectasis. Hepatobiliary: Prior cholecystectomy. Unremarkable unenhanced appearance of the liver. Pancreas: Stable pancreatic parenchymal atrophy with coarse pancreatic calcifications consistent with sequela of chronic calcific pancreatitis. No acute inflammatory changes. Spleen: Unremarkable unenhanced appearance. Adrenals/Urinary Tract: There are bilateral percutaneous nephrostomy tubes again identified, coiled within the bilateral renal pelves. A right ureteral stent extends from the right renal pelvis into the right lower quadrant urostomy. Mild bilateral hydronephrosis is identified, stable on the right but improved on the left since prior exam. The adrenals are unremarkable. Prior cystectomy with right lower quadrant urinary diversion again identified. Stomach/Bowel: No bowel obstruction or  ileus. Distal colonic diverticulosis without evidence of acute diverticulitis. No bowel wall thickening or inflammatory change. Enteric catheter is identified, tip within the proximal duodenum. Vascular/Lymphatic: Aortic atherosclerosis. No enlarged abdominal or pelvic lymph nodes. Reproductive: Prior prostatectomy. Other: No free fluid or free intraperitoneal gas. Interval removal of the percutaneous drain within the right lower quadrant abdominal wall adjacent to the diverting urostomy. Punctate foci of gas are seen within the fluid collection within the right ventral pelvic wall, with no appreciable change change in the size of the fluid collection since prior study. Please correlate with the timing of the drain removal, as this could explain the increased gas within the collection. Otherwise, infection could be considered. Stable fat containing right inguinal hernia.  No bowel herniation. Musculoskeletal: No acute or destructive bony abnormalities. Reconstructed images demonstrate no additional findings. IMPRESSION: 1. Stable bilateral percutaneous nephrostomy tubes and right ureteral stent. Mild bilateral hydronephrosis, stable on the right and improved on the left since prior exam. 2. Right ventral pelvic wall fluid collection, not appreciably changed in size since prior study. There are increased punctate foci of gas within the fluid collection, which could be due to recent drain removal or superimposed infection. 3. Distal colonic diverticulosis without diverticulitis. 4. Small bilateral pleural effusions and dependent lower lobe atelectasis. 5.  Aortic Atherosclerosis (ICD10-I70.0). Electronically Signed   By: Ozell Daring M.D.   On: 07/31/2024 16:36   DG CHEST PORT 1 VIEW Result Date: 07/30/2024 CLINICAL DATA:  Cough. EXAM: PORTABLE CHEST 1 VIEW COMPARISON:  Chest radiograph dated 07/27/2024. FINDINGS: Right-sided PICC with tip at the cavoatrial junction. Enteric tube extends below the diaphragm with  tip beyond the inferior margin of the image. No focal consolidation, pleural effusion, pneumothorax. The cardiac silhouette is within limits no acute osseous pathology. IMPRESSION: 1. No active disease. 2. Right-sided PICC with tip at the cavoatrial junction. Electronically Signed   By: Vanetta Chou M.D.   On: 07/30/2024 19:56     Recent Labs    07/30/24 0406 07/31/24 0040  WBC 7.9 7.2  HGB 10.2* 9.7*  HCT 32.6* 30.6*  PLT 136* 104*    Recent Labs    07/30/24 0406 07/31/24 0040  NA 139 136  K 3.5 3.8  CL 103 105  CO2 23 22  GLUCOSE 97 103*  BUN 36* 38*  CREATININE 1.69* 1.57*  CALCIUM  8.6* 8.3*    Intake/Output Summary (Last 24 hours) at 08/01/2024 1338 Last data filed at 08/01/2024 1200 Gross per 24 hour  Intake 1040 ml  Output 1550 ml  Net -510 ml        Physical Exam: Vital Signs Blood pressure 108/75, pulse 100, temperature 98 F (36.7 C), temperature source Oral, resp. rate 18, height 5' 11 (1.803 m), weight 105 kg, SpO2 100%.   Constitutional: No acute distress.  In bed HEENT: NCAT, EOMI, oral membranes moist.  Core track in place. Neck: supple, no JVD. Cardiovascular: RRR without murmur.  1+ bilateral upper extremity and lower extremity edema. Respiratory/Chest: CTA Bilaterally without wheezes or rales. Normal efforrt.  GI/Abdomen: BS +, non-tender, non-distended. Ostomy with liquid brown stool--moderate amount. Ext: no clubbing, cyanosis. Psych: Flat, but cooperative. Skin:  abdominal wound  with pink with granulation tissue, moist, covered in clean dressing. --Unchanged Left nephrostomy tube with bag seal in place.  R flank prior tube site continues to drain--some skin induration but no warmth, minimal drainage of clear fluid.  Urostomy with some sediment, mostly clear output.   Neuro:  alert but slow processing.  [name], hospital, and month and year; not oriented to day.  Much improved from prior exams. Poor attention.  -Improving Unable to perform complex problem-solving  Normal language and speech.  Cranial nerve exam unremarkable.  MMT: BUE 4/5 prox to distal. BLE 2-/5 HF, 3-KE and 4/5 ADF/PF--unchanged No limb ataxia or cerebellar signs.  Bilateral upper and lower extremity intention tremors--waxes and wanes No abnormal tone appreciated.   Prior neuro assessment is c/w today's exam 08/01/2024.   Musculoskeletal: Able to straighten both knees out on bed.  Plantarflexion tone bilateral lower extremities, barely able to range to neutral.   Assessment/Plan: 1. Functional deficits which require 3+ hours per day of interdisciplinary therapy in a comprehensive inpatient rehab setting. Physiatrist is providing close team supervision and 24 hour management of active medical problems listed below. Physiatrist and rehab team continue to assess barriers to discharge/monitor patient progress toward functional and medical goals  Care Tool:  Bathing    Body parts bathed by patient: Chest, Face, Abdomen, Right arm, Left arm, Front perineal area, Right upper leg, Left upper leg   Body parts bathed by helper: Buttocks, Right lower leg, Left lower leg     Bathing assist Assist Level: Moderate Assistance - Patient 50 - 74%     Upper Body Dressing/Undressing Upper body dressing   What is the patient wearing?: Hospital gown only    Upper body assist Assist Level: Moderate Assistance - Patient 50 - 74%    Lower Body Dressing/Undressing Lower body dressing      What is the patient wearing?: Pants     Lower body assist Assist  for lower body dressing: Maximal Assistance - Patient 25 - 49%     Toileting Toileting    Toileting assist Assist for toileting:  (patient has colosteomy and urostomy)     Transfers Chair/bed transfer  Transfers assist  Chair/bed transfer activity did not occur: Safety/medical concerns  Chair/bed transfer assist level: Maximal Assistance - Patient 25 - 49%      Locomotion Ambulation   Ambulation assist   Ambulation activity did not occur: Safety/medical concerns (fatigue/dizziness/orthostatics)          Walk 10 feet activity   Assist  Walk 10 feet activity did not occur: Safety/medical concerns (fatigue/dizziness/orthostatics)        Walk 50 feet activity   Assist Walk 50 feet with 2 turns activity did not occur: Safety/medical concerns (fatigue/dizziness/orthostatics)         Walk 150 feet activity   Assist Walk 150 feet activity did not occur: Safety/medical concerns (fatigue/dizziness/orthostatics)         Walk 10 feet on uneven surface  activity   Assist Walk 10 feet on uneven surfaces activity did not occur: Safety/medical concerns (fatigue/dizziness/orthostatics)         Wheelchair     Assist Is the patient using a wheelchair?: Yes Type of Wheelchair: Manual           Wheelchair 50 feet with 2 turns activity    Assist    Wheelchair 50 feet with 2 turns activity did not occur: Safety/medical concerns       Wheelchair 150 feet activity     Assist  Wheelchair 150 feet activity did not occur: Safety/medical concerns       Blood pressure 108/75, pulse 100, temperature 98 F (36.7 C), temperature source Oral, resp. rate 18, height 5' 11 (1.803 m), weight 105 kg, SpO2 100%. Medical Problem List and Plan: 1. 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  Undergoing medical workups as below, will monitor improvement for the next couple of days --address at teams if current level of care exceeds ability of home caregivers.  - 9-13: Bilateral lower extremity PRAFO's in bed for plantarflexion/tight heel cords  --Continue CIR  therapies including PT, OT, and SLP. Progress has been very slow. May need to consider other discharge venue   - 9/16: Declining with therapies; pleasantly confused today, interactions much less withdrawn but nonsensical. Will need Mod-Max A at home. Concern for physical decline exceeding family ability for caregiving if no improvements moving forward.   - 9-17 waiting on calorie counts today to determine if feeding tube needed; if patient/family continue to refuse, would recommend goals of care discussion and possible palliative care consult--patient opted for placement of tube feed  - 9-18: Due to ongoing significant support needed to maintain blood pressure, as well as chills today, discussed additional workup/treatment with infectious disease; recommend against treatment of yeast at this time given likely contamination, given ongoing decline will get lactic acid and repeat CT abdomen and pelvis without contrast in a.m--lactic acid upper limit of normal 1.9, CT abdomen and pelvis results -right ventral pelvic wall fluid collection not changed in size from prior study, distal colonic diverticulosis, small bilateral pleural effusions  and dependent lower lobe atelectasis, IR consulted yesterday but holding off on drainage of fluid collection  2.  H/o PE/Antithrombotics: -DVT/anticoagulation:  Pharmaceutical: Eliquis              -antiplatelet therapy: N/A  - No further blood appreciated in nephrostomy tubes  3. Pain Management: Oxycodone  prn.    - 9/16: Oxycodone  was removed yesterday due to concerns of this contributing to PM confusion and hypotension; nursing reports family in disagreement with this and patient pain increased. Adjust to Tylenol  PRN for mild to moderate, Percocet 5 mg for severe pain to avoid undertreatment. Monitor BP VERY closely  9-18: Has not required Percocet again since 9-15  4. Mood/Behavior/Sleep: LCSW to follow for evaluation and support.              --melatonin for  insomnia.              -antipsychotic agents: N/A  9-14: Per patient, sleeping well.  Sleep log.  Discussed delirium management with patient's wife  - 9-18 sleep log remains appropriate, no apparent hallucinations or delusions.  Continue current management  5. Neuropsych/cognition/encephalopathy likely secondary to metabolic acidosis vs delirium from prolonged illness/hospitalization: This patient is not capable of making decisions on his own behalf.  - 9-11: Increasingly withdrawn, behavioral changes may be secondary to poor nutritional intake and hyponatremia.  Treating as below, on sertraline  100 mg daily, may need to add additional agent such as mirtazapine but hesitant given ongoing cognitive issues.  9-12: Slightly better today, but consistent with hypoactive delirium with primary difficulty with concentration, no focal neurologic deficits.  Start overnight sleep log, delirium precautions.  Suspect hyponatremia as primary contributor, management as below.  9/13: D3C Flexeril , Megace , Zoloft  due to ongoing cognitive changes.  Sodium is improving.  Get LFTs, ammonia today to evaluate ongoing cognitive deficits--within normal limits.  -14: Cognition seems to be clearing a bit, continue current regimen.   9/15 alert but delayed. Continue to maximize renal/volume/nutritional status 9/16: Much more alert, but delirious; sleep log appears appropriate. Will add Zyprexa  5 mg PRN for aggitation d/t increased aggitation yesterday afternoon. Keep low dose / low likelihood of Qtc prolongation medications only.   9-18: Cognition improving very gradually, QTc remains prolonged.  6. Skin/Wound Care: Routine pressure relief measures. Encourage adequate diet             --dressing changes daily. Gerhardt's cream to buttock wound.   -abdominal wound continues to close despite tenous nutritional status 7. Fluids/Electrolytes/Nutrition/oropharyngeal dysphagia/moderate to severe protein calorie malnutrition:                 --9/10 periactin  yesterday stopped d/t lethargy   -will try megace  beginning today   -RD f/u   -push fluids   -recheck bmet in am   -continue juven bid. Has been refusing Ensure supplement.   -kdur 20meq daily   -can't use zofran  d/t QTc prolongation (490)  - MBS   9/3--D3/thins, prealbumin borderline normal at 20   - 9-11: Nausea remains a primary barrier to adequate p.o. intakes.  Recheck EKG today, QTc was 460, scheduled Zofran  4 mg IV 3 times daily AC.  Will repeat EKG in next 1 to 2 days.  - 9-12: Albumin  infusion per nephrology.  P.o. intakes improving with Zofran .  per IV team midline contraindicated due to bicarb.  May get PICC line once dialysis no longer considered.  9-14: P.o. intakes okay, Zofran  continues to help with nausea.  Per nephrology, okay to  reduce fluids, will keep on 70 cc/h normal saline for maintenance for now.  PICC line order placed.  9/15 intake 20-30%. Receiving mtc IVF.    -k+ 3.3, mg++ 1.3   -continue kdur 20meq daily and add 40meq to IVF   -IV mag sulfae 2g x1, add mag gluconate oral daily    9/16: Mag, K repleted. PO intakes remain very poor. Albumin  2.0. Continue maintenance fluids--will likely need TF.   9/17: Ad phosnak 1 packet x3 doses for repletion of K and Phos; repeat in AM--nephrology changed to IV--repleted 9-18  8. Sepsis/Abdominal wall/mesh infection: IV Vanc/Ceftriaxone  for 6 weeks from 08/21             --Probable 6 months total of antibiotics--ID follow up 09/18 @10  am --Corynebacterium species-->6 more weeks ?Dalbavancin for OP use, cont ceftriazone and linezolid  for now  per ID -appreciate recent ID f/u CRP nl 0.8 ESR 44 WBC 8.2 9-11: WBC labs remained stable. 9/15 wbc 7.1 9/16: Discussed with Dr. Dennise ID possibility of encephalopathy with current antibiotics, as well as +UA yesterday. Recommendation to continue current broad spectrum antibiotics as they are unlikely contributors and regimen has already been altered from  vancomycin . Given some yeast in UA will await culture to see if secondary fungal infection present vs contaminant. -pending 9/17 9/18; per Dr. Dennise, would not treat yeast is likely contaminated with no WBC elevations and no fevers.  Given symptomatic chills and significant blood pressure support ongoing, concern for untreated/undertreated infectious etiology in setting of adrenal insufficiency, will get CT abdomen and pelvis and lactic acid in a.m. for further assessment--LA 1.9, CT abdomen and pelvis results pending 9-19  9. Left> right hydronephrosis: Nephrostomy tubes with stent exchanged on 08/27 --continue clamping right tube and left to drain per urology.  Monitor for blood recheck CBC in am --hemoglobin stable, no further bloody output appreciated -9/15 flush left nephrostomy tube 9-18: Prior right tube site continues to drain, CT abdomen and pelvis as above  10. Acute on chronic renal failure/hyponatremia:  elevated CR- baseline creat probably ~1.4  -9/10 will give addnl IVF today d/t poor po intake   -recheck bmet in am  - 9-11: AKI significantly worse, now with hyponatremia 131.  Will give 1 L fluid bolus normal saline today, recheck BMP; given brisk uptrend despite increase in IV fluids will consult nephrology for further recommendations.  - 9-12: Appreciate nephrology input and recommendations.  Responding slightly to increased IV fluids overnight, normal saline 125/h.  Hyponatremia remains 131. On multiple medications that could be contributing including Florinef , sertraline , and linezolid .  See their note for specific recommendations; advised to remain on fluids for primary treatment of hyponatremia, do not recommend salt tabs at this time.  - If renal function does not improve with fluids may need CT scan of the abdomen and pelvis to verify patency of nephrostomy/ileal conduit drainage   9-13: Potassium chloride  packet 40 mill equivalents ordered this a.m. for repletion.  Sodium  improving, bicarb improving, but renal function remains unchanged on bicarb drip.  9-14: Renal function improved, acidosis resolved, sodium 134.  Per nephrology, can DC fluids, but agreeable to maintenance fluids given ongoing poor p.o. intakes.  Continue to monitor closely.  9/15-17 Cr continues to improve gradually, continue IVF, push po  9/18: On TF, DC IVF, move to FWF 200 ccs QID--monitor closely  9-19: Seems to be tolerating free water  flushes well, continuing to improve.  Continue through this weekend.  11. OSA: Encourage CPAP use 12. Hypotension/tachycardia/?adrenal insufficiency: Continue Midodrine  15 mg TID and Florinef  2 mg daily. Plasma cortisol 13.6 6/25/             --continues to have significant tachycardia with activity likely due to orthostasis and deconditioning.  - -hgb 11.5   -continue metoprolol  low dose 12.5mg  bid, bp remains soft but stable at present   -  IVF as above Per PT- has been not dropping in therapy   9-13: Orthostatic today, DC Lopressor  and increase midodrine  to 20 mg 3 times daily.  Has been getting a significant amount of IV fluid last few days, still likely volume depleted per nephrology.  9-14: Blood pressure remains stable, but soft. 9/15 hypotensive this morning.  Continue IVF and plan as above 9/16: A bit improved today; continue IVF, no signs of volume overload. If profoundly hypotensive again may need to transfer to acute given maximizing fluids/BP support without pressors.  9-18: DC IV fluid as above today; monitor blood pressures very closely, seem more stable than they were last week.  On chart review, was minimally responsive to trial of high-dose steroids in August; does not look like cosyntropin stim test was ever performed; Continue Florinef  for now, may need to consider if blood pressures do not improve 9-19: Blood pressure holding up in the low 100s, continue current regimen. 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    9/1 held reglan   -9/16: resume reglan  given low OP, nausea--with benefit, output remains low   - 9/18: Ostomy output picking up considerably with tube feeds; on fibercon BID; appreciate nutrition input  14. Depression: On Zoloft -- Dced 9/13 due to hyponatremia and confusion   - 9/16: Family concerned for serotonin syndrome with zoloft +linezolid - no tachycardia, fevers, seizures, or severe agitation reported - encephalopathy has not improved with removal of zoloft . Low suspicion at this time.   15. Malnutrition/: Albumin  @1 .9/TP-5.1.  --> 2.2 9/11;  - 9-11: Adding Premeal Zofran  as above.  Will get nutrition to start calorie count; may need additional supplementation or discussion of feeding tube if does not improve.  - 9-12: Slightly improved, continue this regimen.  9-13: Very poor p.o. intakes yesterday.  DC Megace  due to possible cognitive effects, no perceived benefit.  Will discuss possible feeding tube with family--patient rejects, will get calorie count for 48 hours and if no improvement may readdress tube feed versus palliative consult  9-14: Wife encouraging p.o.'s, patient motivated to avoid feeding tube.  Calorie count started.  Maintenance fluids as above.  9/16: Qtc 519; DC zofran , resume relgan 5 mg TID AC as he did well with this in the past and ostomy output has slowed considerably  9-17: QTc remains prolonged; will not resume Zofran  at this time.  Patient agreeable to placement of core track and discussed with nutrition starting with nocturnal tube feeds for supplementation, to allow appetite during the day to continue to work on increasing p.o.'s.  Will give one-time Xanax  to assist in anxiety with core track placement.  9-18: Tolerating tube feeds well, labs  looking stable.  P.o. intakes poor this  a.m. due to timing of therapy/medication/breakfast, continue to encourage as primary form of nutrition.  9-19: Tolerating overnight tube feeds well, p.o. intakes during the day complicated by gagging/coughing due to tube feed placement.  Chest x-ray yesterday was negative.  Orders to maintain head of bed 30 degrees whether or not tube feeds are running, and viscous lidocaine  4 times daily as needed for sensation of gagging.  9/20 nausea noted today, patient did get  dose of Compazine  and is continued on Reglan .  Will check with pharmacy to see if any new medications could be contributing.Appears he was transition to nocturnal tube feeds may be rate a little high for him will decrease to 60 mL/h overnight.  Discussed with pharmacy he got a one-time dose of Oritivancin yesterday, he should not receive any more of this as this was a one-time order   16. Anemia of chronic illness: Stable overall 9/8-11  - Hemoglobin stable 9-10       Latest Ref Rng & Units 07/31/2024   12:40 AM 07/30/2024    4:06 AM 07/27/2024    2:29 AM  CBC  WBC 4.0 - 10.5 K/uL 7.2  7.9  7.1   Hemoglobin 13.0 - 17.0 g/dL 9.7  89.7  89.3   Hematocrit 39.0 - 52.0 % 30.6  32.6  32.9   Platelets 150 - 400 K/uL 104  136  215     17.  Thrombocytopenia.  Mild, 136 today.  Monitor very closely in setting of linezolid .   - 9/19: 104; per Dr. Dennise likely adjusting linezolid  to orvitatancin starting Monday.  Continue to trend  18.  Hypophosphatemia.  1.3 to 3.1 with IV repletion, next day 1.6.  - 9-19: Per electrolyte protocol, start Phos-Nak Powder via tube feed 2 packets 4 times daily for 5 doses  - Repeat daily.  -9/20 replete today, 15mmol IV,  discussed dose pharmacy  19. Low MG  -9/20 replete today 2g IV,  discussed dose pharmacy LOS: 21 days A FACE TO FACE EVALUATION WAS PERFORMED  Murray Collier 08/01/2024, 1:38 PM

## 2024-08-01 NOTE — Progress Notes (Signed)
 Interventional Radiology Brief Note:  Kristopher Thompson is a 73 year old male with history of radical cystectomy/ileal conduit urinary diversion with a known leak of the ileal conduit with bilateral nephrostomy tubes placed 05/04/24 as well as placement of a right retrograde ureteral stent 05/14/24. As of most recent loopogram 06/17/24, leak has resolved.  He underwent last PCN/PNCL/stent exchange 07/09/24.  Now admitted on rehab for ongoing management of his chronic debility and medical needs. Repeat imaging performed yesterday and shows stable fluid collection in the abdominal wall.  Patient without elevated WBC, remains afebrile, collection appears similar in size and nature.  Per Dr. Jenna, no aspiration or drainage planned for now.   Tal Neer, MS RD PA-C

## 2024-08-01 NOTE — Plan of Care (Signed)
  Problem: RH SKIN INTEGRITY Goal: RH STG SKIN FREE OF INFECTION/BREAKDOWN Description: Manage skin free of infection with supervision assistance Outcome: Progressing   Problem: RH SAFETY Goal: RH STG ADHERE TO SAFETY PRECAUTIONS W/ASSISTANCE/DEVICE Description: STG Adhere to Safety Precautions With Assistance/Device. Outcome: Progressing

## 2024-08-01 NOTE — Progress Notes (Signed)
 Occupational Therapy Session Note  Patient Details  Name: Kristopher Thompson MRN: [id number] Date of Birth: October 10, 1951  Today's Date: 08/01/2024 OT Individual Time: 8547-8467 OT Individual Time Calculation (min): 40 min    Short Term Goals: Week 3:  OT Short Term Goal 1 (Week 3): Pt will maintain static sitting balance with min A OT Short Term Goal 2 (Week 3): Pt will complete sit > stand from elevated surface with mod A OT Short Term Goal 3 (Week 3): Pt will complete bed mobility rolling R and L with min A to reduce burden of care for wife  Skilled Therapeutic Interventions/Progress Updates:    Pt greeted semi-reclined in bed with spouse present. Pt reported being nauseous today, but agreeable to OT treatment session. Pt completed bed mobility with increased time and max A. Total  A to don socks at EOB. Stedy used for max A sit<>stand, then transfer to TIS wc. Noted patients pants were wet, so after extended rest break, we stood again with Stedy and max A +2 to take pants off. Pt needed extended rest break again before changing pants in wc with max A to thread new pants. Then Max A for partial stand enough for rehab tech to pull pants over hips. Pt took extended rest break, then completed 2 sets of 10 arm raises in TIS wc with encouragement. Pt requested to stay up in TIS wc with encouragement to stay up for 1 hour. :Pt left tilted with alarm belt on, call bell in reach and needs met.   Therapy Documentation Precautions:  Precautions Precautions: Fall (HOH, Urostomy, Colostomy, PCN  drain, and orthostatic hypotension.) Recall of Precautions/Restrictions: Impaired Precaution/Restrictions Comments: urostomy, colostomy, lt PCN drain, watch BP and HR Restrictions Weight Bearing Restrictions Per Provider Order: No Pain:  Pt reports abdominal pain, no number given, rest and repositioned   Therapy/Group: Individual Therapy  Sharyle GORMAN Pert 08/01/2024, 3:25 PM

## 2024-08-02 LAB — BASIC METABOLIC PANEL WITH GFR
Anion gap: 9 (ref 5–15)
BUN: 40 mg/dL — ABNORMAL HIGH (ref 8–23)
CO2: 22 mmol/L (ref 22–32)
Calcium: 8.9 mg/dL (ref 8.9–10.3)
Chloride: 106 mmol/L (ref 98–111)
Creatinine, Ser: 1.29 mg/dL — ABNORMAL HIGH (ref 0.61–1.24)
GFR, Estimated: 59 mL/min — ABNORMAL LOW (ref >60)
Glucose, Bld: 120 mg/dL — ABNORMAL HIGH (ref 70–99)
Potassium: 4.1 mmol/L (ref 3.5–5.1)
Sodium: 137 mmol/L (ref 135–145)

## 2024-08-02 LAB — MAGNESIUM: Magnesium: 2.2 mg/dL (ref 1.7–2.4)

## 2024-08-02 LAB — GLUCOSE, CAPILLARY
Glucose-Capillary: 110 mg/dL — ABNORMAL HIGH (ref 70–99)
Glucose-Capillary: 130 mg/dL — ABNORMAL HIGH (ref 70–99)
Glucose-Capillary: 118 mg/dL — ABNORMAL HIGH (ref 70–99)

## 2024-08-02 LAB — PHOSPHORUS: Phosphorus: 1.9 mg/dL — ABNORMAL LOW (ref 2.5–4.6)

## 2024-08-02 LAB — CBC
HCT: 30.5 % — ABNORMAL LOW (ref 39.0–52.0)
Hemoglobin: 9.7 g/dL — ABNORMAL LOW (ref 13.0–17.0)
MCH: 26.6 pg (ref 26.0–34.0)
MCHC: 31.8 g/dL (ref 30.0–36.0)
MCV: 83.6 fL (ref 80.0–100.0)
Platelets: 82 K/uL — ABNORMAL LOW (ref 150–400)
RBC: 3.65 MIL/uL — ABNORMAL LOW (ref 4.22–5.81)
RDW: 14.7 % (ref 11.5–15.5)
WBC: 8.4 K/uL (ref 4.0–10.5)
nRBC: 0 % (ref 0.0–0.2)

## 2024-08-02 MED ORDER — SODIUM PHOSPHATES 45 MMOLE/15ML IV SOLN
15.0000 mmol | Freq: Once | INTRAVENOUS | Status: AC
  Administered 2024-08-02: 15 mmol via INTRAVENOUS
  Filled 2024-08-02: qty 5

## 2024-08-02 NOTE — Progress Notes (Addendum)
 PROGRESS NOTE   Subjective/Complaints: Patient reports nausea is doing better today.  Still feels fatigued   ROS: Patient denies fever, rash, sore throat, blurred vision, dizziness,  , vomiting, diarrhea, cough, shortness of breath or chest pain, joint or back/neck pain, headache, or mood change.  + Chills--ongoing + nausea-improved today  Objective:   No results found.    Recent Labs    07/31/24 0040 08/02/24 0546  WBC 7.2 8.4  HGB 9.7* 9.7*  HCT 30.6* 30.5*  PLT 104* 82*    Recent Labs    07/31/24 0040 08/02/24 0546  NA 136 137  K 3.8 4.1  CL 105 106  CO2 22 22  GLUCOSE 103* 120*  BUN 38* 40*  CREATININE 1.57* 1.29*  CALCIUM  8.3* 8.9    Intake/Output Summary (Last 24 hours) at 08/02/2024 1217 Last data filed at 08/02/2024 1159 Gross per 24 hour  Intake 4803.44 ml  Output 1025 ml  Net 3778.44 ml        Physical Exam: Vital Signs Blood pressure 111/70, pulse 90, temperature 98.4 F (36.9 C), temperature source Oral, resp. rate 16, height 5' 11 (1.803 m), weight 104.9 kg, SpO2 99%.   Constitutional: No acute distress.  In bed, appears comfortable overall HEENT: NCAT, EOMI, oral membranes moist.  Core track in place. Neck: supple, no JVD. Cardiovascular: RRR without murmur.  1+ bilateral upper extremity and lower extremity edema. Respiratory/Chest: CTA Bilaterally without wheezes or rales. Normal efforrt.  GI/Abdomen: soft, non-tender, non-distended.  Positive bowel sounds ostomy with liquid brown stool--moderate amount. Ext: no clubbing, cyanosis. Psych: Flat, but cooperative. Skin:  abdominal wound  with pink with granulation tissue, moist, covered in clean dressing. --Unchanged Left nephrostomy tube with bag seal in place.  R flank prior tube site continues to drain--some skin induration but no warmth, minimal drainage of clear fluid.  Urostomy with some sediment, mostly clear output.    Neuro:  alert but slow processing.  [name], hospital, and month and year; not oriented to day.   Poor attention. -Improving Unable to perform complex problem-solving  Normal language and speech.  Cranial nerve exam unremarkable.  MMT: BUE 4/5 prox to distal. BLE 2-/5 HF, 3-KE and 4/5 ADF/PF--unchanged No limb ataxia or cerebellar signs.  Bilateral upper and lower extremity intention tremors--waxes and wanes No abnormal tone appreciated.   Prior neuro assessment is c/w today's exam 08/02/2024.   Musculoskeletal: Able to straighten both knees out on bed.  Plantarflexion tone bilateral lower extremities, barely able to range to neutral.   Assessment/Plan: 1. Functional deficits which require 3+ hours per day of interdisciplinary therapy in a comprehensive inpatient rehab setting. Physiatrist is providing close team supervision and 24 hour management of active medical problems listed below. Physiatrist and rehab team continue to assess barriers to discharge/monitor patient progress toward functional and medical goals  Care Tool:  Bathing    Body parts bathed by patient: Chest, Face, Abdomen, Right arm, Left arm, Front perineal area, Right upper leg, Left upper leg   Body parts bathed by helper: Buttocks, Right lower leg, Left lower leg     Bathing assist Assist Level: Moderate Assistance - Patient 50 -  74%     Upper Body Dressing/Undressing Upper body dressing   What is the patient wearing?: Hospital gown only    Upper body assist Assist Level: Moderate Assistance - Patient 50 - 74%    Lower Body Dressing/Undressing Lower body dressing      What is the patient wearing?: Pants     Lower body assist Assist for lower body dressing: Maximal Assistance - Patient 25 - 49%     Toileting Toileting    Toileting assist Assist for toileting:  (patient has colosteomy and urostomy)     Transfers Chair/bed transfer  Transfers assist  Chair/bed transfer activity  did not occur: Safety/medical concerns  Chair/bed transfer assist level: Maximal Assistance - Patient 25 - 49%     Locomotion Ambulation   Ambulation assist   Ambulation activity did not occur: Safety/medical concerns (fatigue/dizziness/orthostatics)          Walk 10 feet activity   Assist  Walk 10 feet activity did not occur: Safety/medical concerns (fatigue/dizziness/orthostatics)        Walk 50 feet activity   Assist Walk 50 feet with 2 turns activity did not occur: Safety/medical concerns (fatigue/dizziness/orthostatics)         Walk 150 feet activity   Assist Walk 150 feet activity did not occur: Safety/medical concerns (fatigue/dizziness/orthostatics)         Walk 10 feet on uneven surface  activity   Assist Walk 10 feet on uneven surfaces activity did not occur: Safety/medical concerns (fatigue/dizziness/orthostatics)         Wheelchair     Assist Is the patient using a wheelchair?: Yes Type of Wheelchair: Manual           Wheelchair 50 feet with 2 turns activity    Assist    Wheelchair 50 feet with 2 turns activity did not occur: Safety/medical concerns       Wheelchair 150 feet activity     Assist  Wheelchair 150 feet activity did not occur: Safety/medical concerns       Blood pressure 111/70, pulse 90, temperature 98.4 F (36.9 C), temperature source Oral, resp. rate 16, height 5' 11 (1.803 m), weight 104.9 kg, SpO2 99%. Medical Problem List and Plan: 1. 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  Undergoing medical workups as below, will monitor improvement for the next couple of days --address at teams if current level of care exceeds ability of  home caregivers.  - 9-13: Bilateral lower extremity PRAFO's in bed for plantarflexion/tight heel cords  --Continue CIR therapies including PT, OT, and SLP. Progress has been very slow. May need to consider other discharge venue   - 9/16: Declining with therapies; pleasantly confused today, interactions much less withdrawn but nonsensical. Will need Mod-Max A at home. Concern for physical decline exceeding family ability for caregiving if no improvements moving forward.   - 9-17 waiting on calorie counts today to determine if feeding tube needed; if patient/family continue to refuse, would recommend goals of care discussion and possible palliative care consult--patient opted for placement of tube feed  - 9-18: Due to ongoing significant support needed to maintain blood pressure, as well as chills today, discussed  additional workup/treatment with infectious disease; recommend against treatment of yeast at this time given likely contamination, given ongoing decline will get lactic acid and repeat CT abdomen and pelvis without contrast in a.m--lactic acid upper limit of normal 1.9, CT abdomen and pelvis results -right ventral pelvic wall fluid collection not changed in size from prior study, distal colonic diverticulosis, small bilateral pleural effusions and dependent lower lobe atelectasis, IR consulted yesterday but holding off on drainage of fluid collection  2.  H/o PE/Antithrombotics: -DVT/anticoagulation:  Pharmaceutical: Eliquis              -antiplatelet therapy: N/A  - No further blood appreciated in nephrostomy tubes  3. Pain Management: Oxycodone  prn.    - 9/16: Oxycodone  was removed yesterday due to concerns of this contributing to PM confusion and hypotension; nursing reports family in disagreement with this and patient pain increased. Adjust to Tylenol  PRN for mild to moderate, Percocet 5 mg for severe pain to avoid undertreatment. Monitor BP VERY closely  9-18: Has not required Percocet  again since 9-15  9/21 patient reports pain is overall under control  4. Mood/Behavior/Sleep: LCSW to follow for evaluation and support.              --melatonin for insomnia.              -antipsychotic agents: N/A  9-14: Per patient, sleeping well.  Sleep log.  Discussed delirium management with patient's wife  - 9-18 sleep log remains appropriate, no apparent hallucinations or delusions.  Continue current management  5. Neuropsych/cognition/encephalopathy likely secondary to metabolic acidosis vs delirium from prolonged illness/hospitalization: This patient is not capable of making decisions on his own behalf.  - 9-11: Increasingly withdrawn, behavioral changes may be secondary to poor nutritional intake and hyponatremia.  Treating as below, on sertraline  100 mg daily, may need to add additional agent such as mirtazapine but hesitant given ongoing cognitive issues.  9-12: Slightly better today, but consistent with hypoactive delirium with primary difficulty with concentration, no focal neurologic deficits.  Start overnight sleep log, delirium precautions.  Suspect hyponatremia as primary contributor, management as below.  9/13: D3C Flexeril , Megace , Zoloft  due to ongoing cognitive changes.  Sodium is improving.  Get LFTs, ammonia today to evaluate ongoing cognitive deficits--within normal limits.  -14: Cognition seems to be clearing a bit, continue current regimen.   9/15 alert but delayed. Continue to maximize renal/volume/nutritional status 9/16: Much more alert, but delirious; sleep log appears appropriate. Will add Zyprexa  5 mg PRN for aggitation d/t increased aggitation yesterday afternoon. Keep low dose / low likelihood of Qtc prolongation medications only.   9-18: Cognition improving very gradually, QTc remains prolonged.  DC zyprexa  due to QTC   6. Skin/Wound Care: Routine pressure relief measures. Encourage adequate diet             --dressing changes daily. Gerhardt's cream to  buttock wound.   -abdominal wound continues to close despite tenous nutritional status 7. Fluids/Electrolytes/Nutrition/oropharyngeal dysphagia/moderate to severe protein calorie malnutrition:                --9/10 periactin  yesterday stopped d/t lethargy   -will try megace  beginning today   -RD f/u   -push fluids   -recheck bmet in am   -continue juven bid. Has been refusing Ensure supplement.   -kdur 20meq daily   -can't use zofran  d/t QTc prolongation (490)  - MBS   9/3--D3/thins, prealbumin borderline normal at 20   - 9-11: Nausea remains a  primary barrier to adequate p.o. intakes.  Recheck EKG today, QTc was 460, scheduled Zofran  4 mg IV 3 times daily AC.  Will repeat EKG in next 1 to 2 days.  - 9-12: Albumin  infusion per nephrology.  P.o. intakes improving with Zofran .  per IV team midline contraindicated due to bicarb.  May get PICC line once dialysis no longer considered.  9-14: P.o. intakes okay, Zofran  continues to help with nausea.  Per nephrology, okay to reduce fluids, will keep on 70 cc/h normal saline for maintenance for now.  PICC line order placed.  9/15 intake 20-30%. Receiving mtc IVF.    -k+ 3.3, mg++ 1.3   -continue kdur 20meq daily and add 40meq to IVF   -IV mag sulfae 2g x1, add mag gluconate oral daily    9/16: Mag, K repleted. PO intakes remain very poor. Albumin  2.0. Continue maintenance fluids--will likely need TF.   9/17: Ad phosnak 1 packet x3 doses for repletion of K and Phos; repeat in AM--nephrology changed to IV--repleted 9-18  8. Sepsis/Abdominal wall/mesh infection: IV Vanc/Ceftriaxone  for 6 weeks from 08/21             --Probable 6 months total of antibiotics--ID follow up 09/18 @10  am --Corynebacterium species-->6 more weeks ?Dalbavancin for OP use, cont ceftriazone and linezolid  for now  per ID -appreciate recent ID f/u CRP nl 0.8 ESR 44 WBC 8.2 9-11: WBC labs remained stable. 9/15 wbc 7.1 9/16: Discussed with Dr. Dennise ID possibility of  encephalopathy with current antibiotics, as well as +UA yesterday. Recommendation to continue current broad spectrum antibiotics as they are unlikely contributors and regimen has already been altered from vancomycin . Given some yeast in UA will await culture to see if secondary fungal infection present vs contaminant. -pending 9/17 9/18; per Dr. Dennise, would not treat yeast is likely contaminated with no WBC elevations and no fevers.  Given symptomatic chills and significant blood pressure support ongoing, concern for untreated/undertreated infectious etiology in setting of adrenal insufficiency, will get CT abdomen and pelvis and lactic acid in a.m. for further assessment--LA 1.9, CT abdomen and pelvis results pending 9-19  9. Left> right hydronephrosis: Nephrostomy tubes with stent exchanged on 08/27 --continue clamping right tube and left to drain per urology.  Monitor for blood recheck CBC in am --hemoglobin stable, no further bloody output appreciated -9/15 flush left nephrostomy tube 9-18: Prior right tube site continues to drain, CT abdomen and pelvis as above  10. Acute on chronic renal failure/hyponatremia:  elevated CR- baseline creat probably ~1.4  -9/10 will give addnl IVF today d/t poor po intake   -recheck bmet in am  - 9-11: AKI significantly worse, now with hyponatremia 131.  Will give 1 L fluid bolus normal saline today, recheck BMP; given brisk uptrend despite increase in IV fluids will consult nephrology for further recommendations.  - 9-12: Appreciate nephrology input and recommendations.  Responding slightly to increased IV fluids overnight, normal saline 125/h.  Hyponatremia remains 131. On multiple medications that could be contributing including Florinef , sertraline , and linezolid .  See their note for specific recommendations; advised to remain on fluids for primary treatment of hyponatremia, do not recommend salt tabs at this time.  - If renal function does not improve with  fluids may need CT scan of the abdomen and pelvis to verify patency of nephrostomy/ileal conduit drainage   9-13: Potassium chloride  packet 40 mill equivalents ordered this a.m. for repletion.  Sodium improving, bicarb improving, but renal function remains unchanged on  bicarb drip.  9-14: Renal function improved, acidosis resolved, sodium 134.  Per nephrology, can DC fluids, but agreeable to maintenance fluids given ongoing poor p.o. intakes.  Continue to monitor closely.  9/15-17 Cr continues to improve gradually, continue IVF, push po  9/18: On TF, DC IVF, move to FWF 200 ccs QID--monitor closely  9-19: Seems to be tolerating free water  flushes well, continuing to improve.  Continue through this weekend.     9/21 creatinine down to 1.29/BUN 40 continue current regimen and monitor    11. OSA: Encourage CPAP use 12. Hypotension/tachycardia/?adrenal insufficiency: Continue Midodrine  15 mg TID and Florinef  2 mg daily. Plasma cortisol 13.6 6/25/             --continues to have significant tachycardia with activity likely due to orthostasis and deconditioning.  - -hgb 11.5   -continue metoprolol  low dose 12.5mg  bid, bp remains soft but stable at present   -  IVF as above Per PT- has been not dropping in therapy   9-13: Orthostatic today, DC Lopressor  and increase midodrine  to 20 mg 3 times daily.  Has been getting a significant amount of IV fluid last few days, still likely volume depleted per nephrology.  9-14: Blood pressure remains stable, but soft. 9/15 hypotensive this morning.  Continue IVF and plan as above 9/16: A bit improved today; continue IVF, no signs of volume overload. If profoundly hypotensive again may need to transfer to acute given maximizing fluids/BP support without pressors.  9-18: DC IV fluid as above today; monitor blood pressures very closely, seem more stable than they were last week.  On chart review, was minimally responsive to trial of high-dose steroids in August; does  not look like cosyntropin stim test was ever performed; Continue Florinef  for now, may need to consider if blood pressures do not improve 9-19: Blood pressure holding up in the low 100s, continue current regimen. 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    9/1 held reglan   -9/16: resume reglan  given low OP, nausea--with benefit, output remains low   - 9/18: Ostomy output picking up considerably with tube feeds; on fibercon BID; appreciate nutrition input  14. Depression: On Zoloft -- Dced 9/13 due to hyponatremia and confusion   - 9/16: Family concerned for serotonin syndrome with zoloft +linezolid - no tachycardia, fevers, seizures, or severe agitation reported - encephalopathy has not improved with removal of zoloft . Low suspicion at this time.   15. Malnutrition/: Albumin  @1 .9/TP-5.1.  --> 2.2 9/11;  - 9-11: Adding Premeal Zofran  as above.  Will get nutrition to start calorie count; may need additional supplementation or discussion of feeding tube if does not improve.  - 9-12: Slightly improved, continue this regimen.  9-13: Very poor p.o. intakes yesterday.  DC Megace  due to possible cognitive effects, no perceived benefit.  Will discuss possible feeding tube with family--patient rejects, will get calorie count for 48 hours and if no improvement may readdress tube feed versus palliative consult  9-14: Wife encouraging p.o.'s, patient motivated to avoid feeding tube.  Calorie count started.  Maintenance fluids as above.  9/16: Qtc 519; DC zofran , resume  relgan 5 mg TID AC as he did well with this in the past and ostomy output has slowed considerably  9-17: QTc remains prolonged; will not resume Zofran  at this time.  Patient agreeable to placement of core track  and discussed with nutrition starting with nocturnal tube feeds for supplementation, to allow appetite during the day to continue to work on increasing p.o.'s.  Will give one-time Xanax  to assist in anxiety with core track placement.  9-18: Tolerating tube feeds well, labs looking stable.  P.o. intakes poor this a.m. due to timing of therapy/medication/breakfast, continue to encourage as primary form of nutrition.  9-19: Tolerating overnight tube feeds well, p.o. intakes during the day complicated by gagging/coughing due to tube feed placement.  Chest x-ray yesterday was negative.  Orders to maintain head of bed 30 degrees whether or not tube feeds are running, and viscous lidocaine  4 times daily as needed for sensation of gagging.  9/20 nausea noted today, patient did get  dose of Compazine  and is continued on Reglan .  Will check with pharmacy to see if any new medications could be contributing.Appears he was transition to nocturnal tube feeds may be rate a little high for him will decrease to 60 mL/h overnight.  Discussed with pharmacy he got a one-time dose of Oritivancin yesterday, he should not receive any more of this as this was a one-time order  9/21 nausea improved today continue current regimen   16. Anemia of chronic illness: Stable overall 9/8-11  - Hemoglobin stable 9-10       Latest Ref Rng & Units 08/02/2024    5:46 AM 07/31/2024   12:40 AM 07/30/2024    4:06 AM  CBC  WBC 4.0 - 10.5 K/uL 8.4  7.2  7.9   Hemoglobin 13.0 - 17.0 g/dL 9.7  9.7  89.7   Hematocrit 39.0 - 52.0 % 30.5  30.6  32.6   Platelets 150 - 400 K/uL 82  104  136     17.  Thrombocytopenia.  Mild, 136 today.  Monitor very closely in setting of linezolid .   - 9/19: 104; per Dr. Dennise likely adjusting linezolid  to orvitatancin starting Monday.  Continue to trend  18.  Hypophosphatemia.  1.3 to 3.1 with IV repletion, next day 1.6.  - 9-19: Per electrolyte protocol, start Phos-Nak Powder via tube feed 2 packets 4 times  daily for 5 doses  - Repeat daily.  -9/20 replete today, 15mmol IV,  discussed dose pharmacy  -9/21 improved to 1.9, continue replete per pharm rec   19. Low MG  -9/20 replete today 2g IV,  discussed dose pharmacy  -9/21 improved today continue to monitor LOS: 22 days A FACE TO FACE EVALUATION WAS PERFORMED  Murray Collier 08/02/2024, 12:17 PM

## 2024-08-02 NOTE — Plan of Care (Signed)
  Problem: Consults Goal: RH GENERAL PATIENT EDUCATION Description: See Patient Education module for education specifics. Outcome: Progressing   

## 2024-08-03 ENCOUNTER — Other Ambulatory Visit (HOSPITAL_COMMUNITY): Payer: Self-pay

## 2024-08-03 LAB — BASIC METABOLIC PANEL WITH GFR
Anion gap: 9 (ref 5–15)
BUN: 35 mg/dL — ABNORMAL HIGH (ref 8–23)
CO2: 21 mmol/L — ABNORMAL LOW (ref 22–32)
Calcium: 8.5 mg/dL — ABNORMAL LOW (ref 8.9–10.3)
Chloride: 109 mmol/L (ref 98–111)
Creatinine, Ser: 1.19 mg/dL (ref 0.61–1.24)
GFR, Estimated: >60 mL/min (ref >60)
Glucose, Bld: 120 mg/dL — ABNORMAL HIGH (ref 70–99)
Potassium: 3.9 mmol/L (ref 3.5–5.1)
Sodium: 139 mmol/L (ref 135–145)

## 2024-08-03 LAB — CBC
HCT: 27.8 % — ABNORMAL LOW (ref 39.0–52.0)
Hemoglobin: 8.6 g/dL — ABNORMAL LOW (ref 13.0–17.0)
MCH: 26.2 pg (ref 26.0–34.0)
MCHC: 30.9 g/dL (ref 30.0–36.0)
MCV: 84.8 fL (ref 80.0–100.0)
Platelets: 71 K/uL — ABNORMAL LOW (ref 150–400)
RBC: 3.28 MIL/uL — ABNORMAL LOW (ref 4.22–5.81)
RDW: 14.6 % (ref 11.5–15.5)
WBC: 6.4 K/uL (ref 4.0–10.5)
nRBC: 0 % (ref 0.0–0.2)

## 2024-08-03 LAB — GLUCOSE, CAPILLARY
Glucose-Capillary: 103 mg/dL — ABNORMAL HIGH (ref 70–99)
Glucose-Capillary: 104 mg/dL — ABNORMAL HIGH (ref 70–99)
Glucose-Capillary: 109 mg/dL — ABNORMAL HIGH (ref 70–99)
Glucose-Capillary: 113 mg/dL — ABNORMAL HIGH (ref 70–99)
Glucose-Capillary: 118 mg/dL — ABNORMAL HIGH (ref 70–99)
Glucose-Capillary: 122 mg/dL — ABNORMAL HIGH (ref 70–99)
Glucose-Capillary: 129 mg/dL — ABNORMAL HIGH (ref 70–99)
Glucose-Capillary: 159 mg/dL — ABNORMAL HIGH (ref 70–99)

## 2024-08-03 LAB — SEDIMENTATION RATE: Sed Rate: 40 mm/h — ABNORMAL HIGH (ref 0–16)

## 2024-08-03 LAB — PHOSPHORUS: Phosphorus: 2.3 mg/dL — ABNORMAL LOW (ref 2.5–4.6)

## 2024-08-03 LAB — C-REACTIVE PROTEIN: CRP: 1 mg/dL — ABNORMAL HIGH (ref <1.0)

## 2024-08-03 MED ORDER — PANCRELIPASE (LIP-PROT-AMYL) 10440-39150 UNITS PO TABS
20880.0000 [IU] | ORAL_TABLET | Freq: Once | ORAL | Status: DC
Start: 2024-08-03 — End: 2024-08-03
  Filled 2024-08-03: qty 2

## 2024-08-03 MED ORDER — SODIUM CHLORIDE 0.9 % IV SOLN
15.0000 mmol | Freq: Once | INTRAVENOUS | Status: AC
  Administered 2024-08-03: 15 mmol via INTRAVENOUS
  Filled 2024-08-03: qty 5

## 2024-08-03 MED ORDER — SODIUM BICARBONATE 650 MG PO TABS: 650.0000 mg | ORAL_TABLET | Freq: Once | ORAL | Status: DC

## 2024-08-03 NOTE — Plan of Care (Signed)
  Problem: Consults Goal: RH GENERAL PATIENT EDUCATION Description: See Patient Education module for education specifics. Outcome: Progressing   

## 2024-08-03 NOTE — Progress Notes (Signed)
 Physical Therapy Session Note  Patient Details  Name: Kristopher Thompson MRN: [id number] Date of Birth: 09-Nov-1951  Today's Date: 08/03/2024 PT Individual Time: 1002-1114 PT Individual Time Calculation (min): 72 min   Short Term Goals: Week 3:  PT Short Term Goal 1 (Week 3): STGs = LTGs  Skilled Therapeutic Interventions/Progress Updates:    Pt presents in bed eager to get up and OOB. Donned Tedhose and shoes total A in preparation for OOB. Mod A to come to EOB with focus on pt initiating most of movement, required some facilitation at trunk but improved technique with rolling to the side first (initially wanted to sit straight up). CGA for sitting balance EOB to don belt and position [place]. Max A to facilitate anterior weightshift and power up with Stedy+2 (mod +2 to finish sit > stand). Mod A from elevated surface of Stedy to transfer to TIS. Utilized standing frame for NMR to work on standing tolerance, posture, and muscle activation. Completed 3 trials of about 1 min each, increased WOB each time and noted increased HR (BP was stable at 136/68 mmHg; HR = 127-148 bpm, O2 = 100%. Verbal and tactile cues for trunk extension and knee extension. Cues needed for hand placement and technique throughout. Focused on anterior and lateral leaning for donning and doffing of sling with cues for technique. Reciprocal movement pattern retraining and functional strengthening on Kinetron in seated position with moderate resistance focusing on larger full ROM movements vs small movements x 10 min with minimal rest breaks needed. Returned to room and set up in TIS with all needs in reach and wife at bedside.   Therapy Documentation Precautions:  Precautions Precautions: Fall (HOH, Urostomy, Colostomy, PCN  drain, and orthostatic hypotension.) Recall of Precautions/Restrictions: Impaired Precaution/Restrictions Comments: urostomy, colostomy, lt PCN drain, watch BP and HR Restrictions Weight Bearing Restrictions  Per Provider Order: No  Pain: Denies pain.    Therapy/Group: Individual Therapy  Elnor Pizza Sherrell Pizza WENDI Elnor, PT, DPT, CBIS  08/03/2024, 12:04 PM

## 2024-08-03 NOTE — Progress Notes (Signed)
 Occupational Therapy Session Note  Patient Details  Name: Kristopher Thompson MRN: [id number] Date of Birth: 11/10/1951  Today's Date: 08/03/2024 OT Individual Time: 1305-1400 OT Individual Time Calculation (min): 55 min    Short Term Goals: Week 3:  OT Short Term Goal 1 (Week 3): Pt will maintain static sitting balance with min A OT Short Term Goal 2 (Week 3): Pt will complete sit > stand from elevated surface with mod A OT Short Term Goal 3 (Week 3): Pt will complete bed mobility rolling R and L with min A to reduce burden of care for wife  Skilled Therapeutic Interventions/Progress Updates:    Pt received sitting up in the w/c eating lunch with great initiation! Assisted his wife in emptying ileostomy (250 cc) and urostomy bag (125 cc). Pt was taken via w/c to the therapy gym for time management. Session focused on progression of functional mobility and functional activity tolerance. He completed 46 ft initially with the Eva walker to provide increased BUE support to reduce burden on trunk and BLE. He required max A +2 to stand and then only mod +1-2 once on his feet. Patient required increased time for initiation, cuing, rest breaks, and for completion of tasks throughout session. Utilized therapeutic use of self throughout to promote efficiency.  He completed 2 more trials of functional mobility, grading activity up to using the RW. Mod A +2 for remaining two trials with 26 ft each time. He then completed BUE strengthening for carryover to max independence with ADLs- completing bicep curls, forward shoulder raise and chest press with a 1 lb dowel. 2x10 repetitions completed in seated. He returned to his room following. He was left sitting up with all needs met. IV plugged in. Wife present.   Therapy Documentation Precautions:  Precautions Precautions: Fall (HOH, Urostomy, Colostomy, PCN  drain, and orthostatic hypotension.) Recall of Precautions/Restrictions:  Impaired Precaution/Restrictions Comments: urostomy, colostomy, lt PCN drain, watch BP and HR Restrictions Weight Bearing Restrictions Per Provider Order: No  Therapy/Group: Individual Therapy  [name] 08/03/2024, 2:41 PM

## 2024-08-03 NOTE — Plan of Care (Signed)
  Problem: Consults Goal: RH GENERAL PATIENT EDUCATION Description: See Patient Education module for education specifics. Outcome: Progressing   Problem: RH BOWEL ELIMINATION Goal: RH STG MANAGE BOWEL WITH ASSISTANCE Description: STG Manage Bowel with supervision Assistance. Outcome: Progressing   Problem: RH BLADDER ELIMINATION Goal: RH STG MANAGE BLADDER WITH ASSISTANCE Description: STG Manage Bladder With supervision Assistance Outcome: Progressing   Problem: RH SKIN INTEGRITY Goal: RH STG SKIN FREE OF INFECTION/BREAKDOWN Description: Manage skin free of infection with supervision assistance Outcome: Progressing   Problem: RH SAFETY Goal: RH STG ADHERE TO SAFETY PRECAUTIONS W/ASSISTANCE/DEVICE Description: STG Adhere to Safety Precautions With Assistance/Device. Outcome: Progressing   Problem: RH PAIN MANAGEMENT Goal: RH STG PAIN MANAGED AT OR BELOW PT'S PAIN GOAL Description: <4 w/ prns Outcome: Progressing   Problem: RH KNOWLEDGE DEFICIT GENERAL Goal: RH STG INCREASE KNOWLEDGE OF SELF CARE AFTER HOSPITALIZATION Description: Manage increase knowledge of self care care after hospitalization with supervision assistance from wife using educational materials provided Outcome: Progressing

## 2024-08-03 NOTE — Progress Notes (Addendum)
 PROGRESS NOTE   Subjective/Complaints:  No events overnight.  Patient states he slept well.  He is eating much better now, ate 100% of dinner yesterday per his report.  Yesterday, ate 100% of breakfast and 50% of lunch.  States nausea is much improved since antibiotics were changed. HgB 9.7-> 8.6;  PLT down to 71; Phos 2.3 today   ROS: Patient denies fever, rash, sore throat, blurred vision, dizziness,  , vomiting, diarrhea, cough, shortness of breath or chest pain, joint or back/neck pain, headache, or mood change.  + Chills--improving + nausea-improved   Objective:   No results found.    Recent Labs    08/02/24 0546 08/03/24 0351  WBC 8.4 6.4  HGB 9.7* 8.6*  HCT 30.5* 27.8*  PLT 82* 71*    Recent Labs    08/02/24 0546 08/03/24 0351  NA 137 139  K 4.1 3.9  CL 106 109  CO2 22 21*  GLUCOSE 120* 120*  BUN 40* 35*  CREATININE 1.29* 1.19  CALCIUM  8.9 8.5*    Intake/Output Summary (Last 24 hours) at 08/03/2024 0838 Last data filed at 08/03/2024 0557 Gross per 24 hour  Intake 1592.99 ml  Output 2075 ml  Net -482.01 ml        Physical Exam: Vital Signs Blood pressure 109/66, pulse 90, temperature 97.7 F (36.5 C), temperature source Oral, resp. rate 16, height 5' 11 (1.803 m), weight 105 kg, SpO2 99%.   Constitutional: No acute distress.  Laying in bed. HEENT: NCAT, EOMI, oral membranes moist.  Core track in place. Neck: supple, no JVD. Cardiovascular: RRR without murmur.  1+ bilateral upper extremity and lower extremity edema. Respiratory/Chest: CTA Bilaterally without wheezes or rales. Normal efforrt.  GI/Abdomen: soft, non-tender, non-distended.  Positive bowel sounds ostomy with liquid brown stool--mild amount. Ext: no clubbing, cyanosis. Psych: Flat, but cooperative.  Increasingly dynamic from prior exams. Skin:  abdominal wound  with pink with granulation tissue, moist, covered in clean  dressing. --Unchanged Left nephrostomy tube with bag seal in place. --Unchanged R flank prior tube site continues to drain--some skin induration but no warmth, minimal drainage of clear fluid. --Unchanged Urostomy with some sediment, mostly clear output. --Unchanged  Neuro:  alert but slow processing.  [name], hospital, and month and year; not oriented to day.   Attention much improved from last week, can now follow a intelligible conversation.  Complicated by poor hearing. Improving complex problem-solving.  Normal language and speech.  Cranial nerve exam unremarkable.  MMT: BUE 4/5 prox to distal. BLE 3-/5 HF, 3-KE and 4/5 ADF/PF--slightly improved No limb ataxia or cerebellar signs.  Bilateral upper and lower extremity intention tremors--much better than last week No abnormal tone appreciated.   Musculoskeletal: Able to straighten both knees out on bed.  Plantarflexion tone bilateral lower extremities, barely able to range to neutral.   Assessment/Plan: 1. Functional deficits which require 3+ hours per day of interdisciplinary therapy in a comprehensive inpatient rehab setting. Physiatrist is providing close team supervision and 24 hour management of active medical problems listed below. Physiatrist and rehab team continue to assess barriers to discharge/monitor patient progress toward functional and medical goals  Care Tool:  Bathing    Body parts bathed by patient: Chest, Face, Abdomen, Right arm, Left arm, Front perineal area, Right upper leg, Left upper leg   Body parts bathed by helper: Buttocks, Right lower leg, Left lower leg     Bathing assist Assist Level: Moderate Assistance - Patient 50 - 74%     Upper Body Dressing/Undressing Upper body dressing   What is the patient wearing?: Hospital gown only    Upper body assist Assist Level: Moderate Assistance - Patient 50 - 74%    Lower Body Dressing/Undressing Lower body dressing      What is the patient  wearing?: Pants     Lower body assist Assist for lower body dressing: Maximal Assistance - Patient 25 - 49%     Toileting Toileting    Toileting assist Assist for toileting:  (patient has colosteomy and urostomy)     Transfers Chair/bed transfer  Transfers assist  Chair/bed transfer activity did not occur: Safety/medical concerns  Chair/bed transfer assist level: Maximal Assistance - Patient 25 - 49%     Locomotion Ambulation   Ambulation assist   Ambulation activity did not occur: Safety/medical concerns (fatigue/dizziness/orthostatics)          Walk 10 feet activity   Assist  Walk 10 feet activity did not occur: Safety/medical concerns (fatigue/dizziness/orthostatics)        Walk 50 feet activity   Assist Walk 50 feet with 2 turns activity did not occur: Safety/medical concerns (fatigue/dizziness/orthostatics)         Walk 150 feet activity   Assist Walk 150 feet activity did not occur: Safety/medical concerns (fatigue/dizziness/orthostatics)         Walk 10 feet on uneven surface  activity   Assist Walk 10 feet on uneven surfaces activity did not occur: Safety/medical concerns (fatigue/dizziness/orthostatics)         Wheelchair     Assist Is the patient using a wheelchair?: Yes Type of Wheelchair: Manual           Wheelchair 50 feet with 2 turns activity    Assist    Wheelchair 50 feet with 2 turns activity did not occur: Safety/medical concerns       Wheelchair 150 feet activity     Assist  Wheelchair 150 feet activity did not occur: Safety/medical concerns       Blood pressure 109/66, pulse 90, temperature 97.7 F (36.5 C), temperature source Oral, resp. rate 16, height 5' 11 (1.803 m), weight 105 kg, SpO2 99%. Medical Problem List and Plan: 1. 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  Undergoing medical workups as below, will monitor improvement for the next couple of days --address at teams if current level of care exceeds ability of home caregivers.  - 9-13: Bilateral lower extremity PRAFO's in bed for plantarflexion/tight heel cords  --Continue CIR therapies including PT, OT, and SLP. Progress has been very slow. May need to consider other discharge venue   - 9/16: Declining with therapies; pleasantly confused today, interactions much less withdrawn but nonsensical. Will need Mod-Max A at home. Concern for physical decline exceeding family ability for caregiving if no improvements moving forward.   -  9-17 waiting on calorie counts today to determine if feeding tube needed; if patient/family continue to refuse, would recommend goals of care discussion and possible palliative care consult--patient opted for placement of tube feed  - 9-18: Due to ongoing significant support needed to maintain blood pressure, as well as chills today, discussed additional workup/treatment with infectious disease; recommend against treatment of yeast at this time given likely contamination, given ongoing decline will get lactic acid and repeat CT abdomen and pelvis without contrast in a.m--lactic acid upper limit of normal 1.9, CT abdomen and pelvis results -right ventral pelvic wall fluid collection not changed in size from prior study, distal colonic diverticulosis, small bilateral pleural effusions and dependent lower lobe atelectasis, IR consulted yesterday but holding off on drainage of fluid collection  - . Repeat imaging performed yesterday and shows stable fluid collection in the abdominal wall. Patient without elevated WBC, remains afebrile, collection appears similar in size and nature. Per Dr. Jenna, no aspiration or drainage planned for now.    -  Teams tomorrow a.m.; family meeting in the afternoon   2.  H/o PE/Antithrombotics: -DVT/anticoagulation:  Pharmaceutical: Eliquis              -antiplatelet therapy: N/A  - No further blood appreciated in nephrostomy tubes  3. Pain Management: Oxycodone  prn.    - 9/16: Oxycodone  was removed yesterday due to concerns of this contributing to PM confusion and hypotension; nursing reports family in disagreement with this and patient pain increased. Adjust to Tylenol  PRN for mild to moderate, Percocet 5 mg for severe pain to avoid undertreatment. Monitor BP VERY closely  9-18: Has not required Percocet again since 9-15  9/21 patient reports pain is overall under control  4. Mood/Behavior/Sleep: LCSW to follow for evaluation and support.              --melatonin for insomnia.              -antipsychotic agents: N/A  9-14: Per patient, sleeping well.  Sleep log.  Discussed delirium management with patient's wife  - 9-18 sleep log remains appropriate, no apparent hallucinations or delusions.  Continue current management  5. Neuropsych/cognition/encephalopathy likely secondary to metabolic acidosis vs delirium from prolonged illness/hospitalization: This patient is not capable of making decisions on his own behalf.  - 9-11: Increasingly withdrawn, behavioral changes may be secondary to poor nutritional intake and hyponatremia.  Treating as below, on sertraline  100 mg daily, may need to add additional agent such as mirtazapine but hesitant given ongoing cognitive issues.  9-12: Slightly better today, but consistent with hypoactive delirium with primary difficulty with concentration, no focal neurologic deficits.  Start overnight sleep log, delirium precautions.  Suspect hyponatremia as primary contributor, management as below.  9/13: D3C Flexeril , Megace , Zoloft  due to ongoing cognitive changes.  Sodium is improving.  Get LFTs, ammonia today to evaluate ongoing cognitive deficits--within normal  limits.  -14: Cognition seems to be clearing a bit, continue current regimen.   9/15 alert but delayed. Continue to maximize renal/volume/nutritional status 9/16: Much more alert, but delirious; sleep log appears appropriate. Will add Zyprexa  5 mg PRN for aggitation d/t increased aggitation yesterday afternoon. Keep low dose / low likelihood of Qtc prolongation medications only.   9-18: Cognition improving very gradually, QTc remains prolonged.  DC zyprexa  due to QTC   9-22: Doing much better this week.  Continue with adequate nutrition, hydration  6. Skin/Wound Care: Routine pressure relief measures. Encourage adequate diet             --  dressing changes daily. Gerhardt's cream to buttock wound.   -abdominal wound continues to close despite tenous nutritional status 7. Fluids/Electrolytes/Nutrition/oropharyngeal dysphagia/moderate to severe protein calorie malnutrition:                --9/10 periactin  yesterday stopped d/t lethargy   -will try megace  beginning today   -RD f/u   -push fluids   -recheck bmet in am   -continue juven bid. Has been refusing Ensure supplement.   -kdur 20meq daily   -can't use zofran  d/t QTc prolongation (490)  - MBS   9/3--D3/thins, prealbumin borderline normal at 20   - 9-11: Nausea remains a primary barrier to adequate p.o. intakes.  Recheck EKG today, QTc was 460, scheduled Zofran  4 mg IV 3 times daily AC.  Will repeat EKG in next 1 to 2 days.  - 9-12: Albumin  infusion per nephrology.  P.o. intakes improving with Zofran .  per IV team midline contraindicated due to bicarb.  May get PICC line once dialysis no longer considered.  9-14: P.o. intakes okay, Zofran  continues to help with nausea.  Per nephrology, okay to reduce fluids, will keep on 70 cc/h normal saline for maintenance for now.  PICC line order placed.  9/15 intake 20-30%. Receiving mtc IVF.    -k+ 3.3, mg++ 1.3   -continue kdur 20meq daily and add 40meq to IVF   -IV mag sulfae 2g x1, add mag  gluconate oral daily    9/16: Mag, K repleted. PO intakes remain very poor. Albumin  2.0. Continue maintenance fluids--will likely need TF.   9/17: Ad phosnak 1 packet x3 doses for repletion of K and Phos; repeat in AM--nephrology changed to IV--repleted 9-18  9-22: Nutrition doing much better, eating 50 to 100% of meals.  Replete electrolytes as below.  8. Sepsis/Abdominal wall/mesh infection: IV Vanc/Ceftriaxone  for 6 weeks from 08/21             --Probable 6 months total of antibiotics--ID follow up 09/18 @10  am --Corynebacterium species-->6 more weeks ?Dalbavancin for OP use, cont ceftriazone and linezolid  for now  per ID -appreciate recent ID f/u CRP nl 0.8 ESR 44 WBC 8.2 9-11: WBC labs remained stable. 9/15 wbc 7.1 9/16: Discussed with Dr. Dennise ID possibility of encephalopathy with current antibiotics, as well as +UA yesterday. Recommendation to continue current broad spectrum antibiotics as they are unlikely contributors and regimen has already been altered from vancomycin . Given some yeast in UA will await culture to see if secondary fungal infection present vs contaminant. -pending 9/17 9/18; -LA 1.9, CT abdomen and pelvis results  --see above 9-22: Per Dr Dennise,  then continue oritavancin  and ceftriaxone  till 12/2.  After which can do 6 more weeks on omdada+dalvance/tedezolid.  Continue Rocephin .  9. Left> right hydronephrosis: Nephrostomy tubes with stent exchanged on 08/27 --continue clamping right tube and left to drain per urology.  Monitor for blood recheck CBC in am --hemoglobin stable, no further bloody output appreciated -9/15 flush left nephrostomy tube 9-18: Prior right tube site continues to drain, CT abdomen and pelvis as above  10. Acute on chronic renal failure/hyponatremia:  elevated CR- baseline creat probably ~1.4  -9/10 will give addnl IVF today d/t poor po intake   -recheck bmet in am  - 9-11: AKI significantly worse, now with hyponatremia 131.  Will give 1 L  fluid bolus normal saline today, recheck BMP; given brisk uptrend despite increase in IV fluids will consult nephrology for further recommendations.  - 9-12: Appreciate nephrology input and  recommendations.  Responding slightly to increased IV fluids overnight, normal saline 125/h.  Hyponatremia remains 131. On multiple medications that could be contributing including Florinef , sertraline , and linezolid .  See their note for specific recommendations; advised to remain on fluids for primary treatment of hyponatremia, do not recommend salt tabs at this time.  - If renal function does not improve with fluids may need CT scan of the abdomen and pelvis to verify patency of nephrostomy/ileal conduit drainage   9-13: Potassium chloride  packet 40 mill equivalents ordered this a.m. for repletion.  Sodium improving, bicarb improving, but renal function remains unchanged on bicarb drip.  9-14: Renal function improved, acidosis resolved, sodium 134.  Per nephrology, can DC fluids, but agreeable to maintenance fluids given ongoing poor p.o. intakes.  Continue to monitor closely.  9/15-17 Cr continues to improve gradually, continue IVF, push po  9/18: On TF, DC IVF, move to FWF 200 ccs QID--monitor closely  9-19: Seems to be tolerating free water  flushes well, continuing to improve.  Continue through this weekend.     9/21 creatinine down to 1.29/BUN 40 continue current regimen and monitor  9-22: BUN 35, creatinine 1.19.  11. OSA: Encourage CPAP use 12. Hypotension/tachycardia/?adrenal insufficiency: Continue Midodrine  15 mg TID and Florinef  2 mg daily. Plasma cortisol 13.6 6/25/             --continues to have significant tachycardia with activity likely due to orthostasis and deconditioning.  - -hgb 11.5   -continue metoprolol  low dose 12.5mg  bid, bp remains soft but stable at present   -  IVF as above Per PT- has been not dropping in therapy   9-13: Orthostatic today, DC Lopressor  and increase midodrine  to 20  mg 3 times daily.  Has been getting a significant amount of IV fluid last few days, still likely volume depleted per nephrology.  9-14: Blood pressure remains stable, but soft. 9/15 hypotensive this morning.  Continue IVF and plan as above 9/16: A bit improved today; continue IVF, no signs of volume overload. If profoundly hypotensive again may need to transfer to acute given maximizing fluids/BP support without pressors.  9-18: DC IV fluid as above today; monitor blood pressures very closely, seem more stable than they were last week.  On chart review, was minimally responsive to trial of high-dose steroids in August; does not look like cosyntropin stim test was ever performed; Continue Florinef  for now, may need to consider if blood pressures do not improve 9-19: Blood pressure holding up in the low 100s, continue current regimen. 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    9/1 held reglan   -9/16: resume reglan  given low OP, nausea--with benefit, output remains low   - 9/18: Ostomy output picking up considerably with tube feeds; on fibercon BID; appreciate nutrition input--staying appropriate  14. Depression: On Zoloft -- Dced 9/13 due to hyponatremia and confusion   - 9/16: Family concerned for serotonin syndrome with zoloft +linezolid - no tachycardia, fevers, seizures, or severe agitation reported - encephalopathy has not improved with removal of zoloft . Low suspicion at this  time.   15. Malnutrition/: Albumin  @1 .9/TP-5.1.  --> 2.2 9/11;  - 9-11: Adding Premeal Zofran  as above.  Will get nutrition to start calorie count; may need additional supplementation or discussion of feeding tube if does not  improve.  - 9-12: Slightly improved, continue this regimen.  9-13: Very poor p.o. intakes yesterday.  DC Megace  due to possible cognitive effects, no perceived benefit.  Will discuss possible feeding tube with family--patient rejects, will get calorie count for 48 hours and if no improvement may readdress tube feed versus palliative consult  9-14: Wife encouraging p.o.'s, patient motivated to avoid feeding tube.  Calorie count started.  Maintenance fluids as above.  9/16: Qtc 519; DC zofran , resume relgan 5 mg TID AC as he did well with this in the past and ostomy output has slowed considerably  9-17: QTc remains prolonged; will not resume Zofran  at this time.  Patient agreeable to placement of core track and discussed with nutrition starting with nocturnal tube feeds for supplementation, to allow appetite during the day to continue to work on increasing p.o.'s.  Will give one-time Xanax  to assist in anxiety with core track placement.  9-18: Tolerating tube feeds well, labs looking stable.  P.o. intakes poor this a.m. due to timing of therapy/medication/breakfast, continue to encourage as primary form of nutrition.  9-19: Tolerating overnight tube feeds well, p.o. intakes during the day complicated by gagging/coughing due to tube feed placement.  Chest x-ray yesterday was negative.  Orders to maintain head of bed 30 degrees whether or not tube feeds are running, and viscous lidocaine  4 times daily as needed for sensation of gagging.  9/20 nausea noted today, patient did get  dose of Compazine  and is continued on Reglan .  Will check with pharmacy to see if any new medications could be contributing.Appears he was transition to nocturnal tube feeds may be rate a little high for him will decrease to 60 mL/h overnight.  Discussed with pharmacy he got a one-time dose of Oritivancin yesterday, he should not receive any more of this as this was a one-time order  -9-22- nausea improved with current regimen--helping  considerably with p.o. intakes, may be able to DC tube feeds this week.  Dietary resumed calorie count today.   16. Anemia of chronic illness: Stable overall 9/8-11  - Hemoglobin stable 9-10  9-22:Hemoglobin from 9.7->8.6   No obvious sources of bleeding, repeat in AM.       Latest Ref Rng & Units 08/03/2024    3:51 AM 08/02/2024    5:46 AM 07/31/2024   12:40 AM  CBC  WBC 4.0 - 10.5 K/uL 6.4  8.4  7.2   Hemoglobin 13.0 - 17.0 g/dL 8.6  9.7  9.7   Hematocrit 39.0 - 52.0 % 27.8  30.5  30.6   Platelets 150 - 400 K/uL 71  82  104     17.  Thrombocytopenia.  Mild, 136 today.  Monitor very closely in setting of linezolid .   - 9/19: 104; per Dr. Dennise likely adjusting linezolid  to orvitatancin starting Monday.  Continue to trend  9-22: Remains downtrending, trend.  71 today.  May take a few days to bounce back from linezolid   18.  Hypophosphatemia.  1.3 to 3.1 with IV repletion, next day 1.6.  - 9-19: Per electrolyte protocol, start Phos-Nak Powder via tube feed 2 packets 4 times daily for 5 doses  - Repeat daily.  -9/20 replete today, 15mmol IV,  discussed dose pharmacy  -9/21 improved to  1.9, continue replete per pharm rec   -22: 2.3, replete today, check in a.m.  19. Low MG  -9/20 replete today 2g IV,  discussed dose pharmacy  -9/21 improved today continue to monitor  9-22: Recheck in AM.   LOS: 23 days A FACE TO FACE EVALUATION WAS PERFORMED  Kristopher Thompson Likes 08/03/2024, 8:38 AM

## 2024-08-03 NOTE — Progress Notes (Signed)
 Speech Language Pathology Weekly Progress and Session Note  Patient Details  Name: Kristopher Thompson MRN: [id number] Date of Birth: August 10, 1951  Beginning of progress report period: July 27, 2024 End of progress report period: August 03, 2024  Today's Date: 08/03/2024 SLP Individual Time: 0903-1000; 57 min   Short Term Goals: Week 3: SLP Short Term Goal 1 (Week 3): Patient will demonstrate basic problem solving skills with max multimodal A SLP Short Term Goal 1 - Progress (Week 3): Not met SLP Short Term Goal 2 (Week 3): Patient will recall daily information with supervision verbal A SLP Short Term Goal 2 - Progress (Week 3): Met SLP Short Term Goal 3 (Week 3): Patient will express wants/needs given min multimodal A SLP Short Term Goal 3 - Progress (Week 3): Met SLP Short Term Goal 4 (Week 3): Patient will consume least restrictive diet with mod multimodal A for use of compensatory strategies SLP Short Term Goal 4 - Progress (Week 3): Met SLP Short Term Goal 5 (Week 3): Patient will participate in pharyngeal strengthening exercises SLP Short Term Goal 5 - Progress (Week 3): Met    New Short Term Goals: Week 4: SLP Short Term Goal 1 (Week 4): STGs= LTGs due to ELOS  Weekly Progress Updates: Pt has made some gains and has met 4 of 5 STG's this reporting period due to improved tolerance of diet, problem solving, and recall. Pt has been limited by fluctuating cognition and frequent nausea and regugitation. Cortrak placed this reporting period to assist with hygdration and nutrition. Pt's diet continued to be assessed and diet recommednations remained for Dys 3 and thin liquids. Currently pt continues to require mod to max A for recall, problem solving, and orientation. Pt/ family education ongoing. Pt would benefit from continued skilled SLP intervention to maximize cognition and swallowing in order to maximize his functional independence prior to discharge.  Intensity: Minumum of  1-2 x/day, 30 to 90 minutes Frequency: 3 to 5 out of 7 days Duration/Length of Stay: 9/26 Treatment/Interventions: Cognitive remediation/compensation;Functional tasks;Internal/external aids;Patient/family education;Medication managment;Dysphagia/aspiration precaution training;Therapeutic Activities;Therapeutic Exercise  Daily Session  Skilled Therapeutic Interventions: Patient was seen in am to address cognitive re- training and dysphagia management. Pt was alert and seen at bedside. His wife was present and participated intermittently throughout. Pt indep oriented to month and day of week throughout session. SLP guided pt in review of therapy schedule and challenged pt in recall of information across session duration. After an ~35 minute delay, he recalled upcoming sessions, disciplines, and amount of therapy scheduled with supervision. In other minutes of session, SLP guided pt in completion of pharyngeal exercises. Pt completed 3 sets of 10 isokinetic reps and 3 sets of 10 second isometric holds given max A. Pt also challenged to recall information about his children and grandchildren which he completed with mod A with his wife's assist. Pt left upright in bed with wife present and call button within reach at conclusion of session. SLP to continue POC.   General    Pain Pain Assessment Pain Scale: 0-10 Pain Score: 0-No pain  Therapy/Group: Individual Therapy  [name] 08/03/2024, 4:25 PM

## 2024-08-03 NOTE — Plan of Care (Signed)
  Problem: Consults Goal: RH GENERAL PATIENT EDUCATION Description: See Patient Education module for education specifics. Outcome: Progressing   Problem: RH BOWEL ELIMINATION Goal: RH STG MANAGE BOWEL WITH ASSISTANCE Description: STG Manage Bowel with supervision Assistance. Outcome: Progressing   Problem: RH BLADDER ELIMINATION Goal: RH STG MANAGE BLADDER WITH ASSISTANCE Description: STG Manage Bladder With supervision Assistance Outcome: Progressing   Problem: RH SKIN INTEGRITY Goal: RH STG SKIN FREE OF INFECTION/BREAKDOWN Description: Manage skin free of infection with supervision assistance Outcome: Progressing   Problem: RH SAFETY Goal: RH STG ADHERE TO SAFETY PRECAUTIONS W/ASSISTANCE/DEVICE Description: STG Adhere to Safety Precautions With Assistance/Device. Outcome: Progressing   Problem: RH KNOWLEDGE DEFICIT GENERAL Goal: RH STG INCREASE KNOWLEDGE OF SELF CARE AFTER HOSPITALIZATION Description: Manage increase knowledge of self care care after hospitalization with supervision assistance from wife using educational materials provided Outcome: Progressing

## 2024-08-04 ENCOUNTER — Inpatient Hospital Stay (HOSPITAL_COMMUNITY)

## 2024-08-04 LAB — GLUCOSE, CAPILLARY
Glucose-Capillary: 103 mg/dL — ABNORMAL HIGH (ref 70–99)
Glucose-Capillary: 106 mg/dL — ABNORMAL HIGH (ref 70–99)
Glucose-Capillary: 115 mg/dL — ABNORMAL HIGH (ref 70–99)
Glucose-Capillary: 118 mg/dL — ABNORMAL HIGH (ref 70–99)
Glucose-Capillary: 127 mg/dL — ABNORMAL HIGH (ref 70–99)
Glucose-Capillary: 90 mg/dL (ref 70–99)

## 2024-08-04 LAB — CBC
HCT: 25.9 % — ABNORMAL LOW (ref 39.0–52.0)
Hemoglobin: 8.3 g/dL — ABNORMAL LOW (ref 13.0–17.0)
MCH: 26.7 pg (ref 26.0–34.0)
MCHC: 32 g/dL (ref 30.0–36.0)
MCV: 83.3 fL (ref 80.0–100.0)
Platelets: 72 K/uL — ABNORMAL LOW (ref 150–400)
RBC: 3.11 MIL/uL — ABNORMAL LOW (ref 4.22–5.81)
RDW: 14.7 % (ref 11.5–15.5)
WBC: 6 K/uL (ref 4.0–10.5)
nRBC: 0 % (ref 0.0–0.2)

## 2024-08-04 LAB — MAGNESIUM: Magnesium: 1.7 mg/dL (ref 1.7–2.4)

## 2024-08-04 LAB — PHOSPHORUS: Phosphorus: 3.2 mg/dL (ref 2.5–4.6)

## 2024-08-04 MED ORDER — VITAL 1.5 CAL PO LIQD
1000.0000 mL | ORAL | Status: DC
  Administered 2024-08-04: 1000 mL
  Filled 2024-08-04 (×2): qty 1000

## 2024-08-04 MED ORDER — MIDODRINE HCL 5 MG PO TABS
15.0000 mg | ORAL_TABLET | Freq: Three times a day (TID) | ORAL | Status: DC
Start: 2024-08-04 — End: 2024-08-05
  Administered 2024-08-04 – 2024-08-05 (×3): 15 mg via ORAL
  Filled 2024-08-04 (×3): qty 3

## 2024-08-04 NOTE — Progress Notes (Addendum)
 08/04/2024  Kristopher Thompson [id number] [birth year]  CARE TEAM: PCP: [name], MD  Outpatient Care Team: Patient Care Team: [name], MD as PCP - General (Internal Medicine) [name], MD as Consulting Physician (Dermatology) [name], MD as Referring Physician (Pulmonary Disease) [name], MD as Consulting Physician (General Surgery) [name], MD as Consulting Physician (Urology)  Inpatient Treatment Team: Treatment Team:  Emeline Joesph BROCKS, DO Sula Eulalio MATSU, RN [name], MD Nicholaus Nena DEL, OT [name], MD Fleeta Dixie, Alana, Student-OT Dasie Elsie BROCKS, PT [place], Auria A, LCSW Loftin, Doyce CROME, NT Burnard Joshua GAILS, [place] Lyle Dorothyann LABOR, RPH Kit Frieze A, LPN   Problem List:   Principal Problem:   Critical illness myopathy Active Problems:   Cognitive and behavioral changes   Malnutrition of moderate degree   04/10/2024  POST-OPERATIVE DIAGNOSIS:  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   PROCEDURE:   -ROBOTIC LYSIS OF ADHESIONS X 4 HOURS -COMPONENT SEPARATION - TRANSVERSUS ABDOMINIS REALEASE (TAR) BILATERAL -ROBOTIC REPAIRS OF  INCISIONAL, PARASTOMAL, LEFT INGUINAL  INCARCERATED ABDOMINAL WALL HERNIAS WITH MESH -UROSTOMY ILEAL CONDUIT REVISION -INTRAOPERATIVE ASSESSMENT OF TISSUE VASCULAR PERFUSION USING ICG (indocyanine green ) -IMMUNOFLUORESCENCE -TRANSVERSUS ABDOMINIS PLANE (TAP) BLOCK - BILATERAL    SURGEON:  [name], MD   04/20/2024 POST-OPERATIVE DIAGNOSIS:  perforated bowel   PROCEDURE:  Drainage of right abdominal wall abscess as well as intra-abdominal abscess Explantation of abdominal mesh Small bowel resection (in discontinuity) Placement of ABThera wound VAC   SURGEON:  [name], MD  04/21/2024  POST-OPERATIVE DIAGNOSIS:  DELAYED BOWEL PERFORATION WITH OPEN ABDOMEN   PROCEDURE:   LYSIS OF ADHESIONS X ABDOMINAL WALL DEBRIDEMENT ILEAL  RESECTION END ILEOSTOMY ABDOMINAL WALL PARASTOMAL & INCISIONAL HERNIA REPAIRS WITH PHASIX MESH FASCIA CLOSURE WITH WOUND VAC PLACEMENT IN SQ   SURGEON:  [name], MD    05/04/2024 Interventional Radiology Procedure Note   Procedure: Bilateral percutaneous nephrostomy tube placements   Findings: Please refer to procedural dictation for full description. 10 Fr bilateral to bag drainage.   Complications: None immediate   Estimated Blood Loss: < 5 ml   Recommendations: Keep to bag drainage.  IR will follow.  Ultimate management as per Urology.   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    Plan:  Delayed urine leak - RESOLVED:  Nephrostomy tubes & stenting per [name] Urology.  I wonder for the point that it safe to start removing them since he has had no recurrent leaking nor AKI with them capped.  I sent a message since it has been a month.   Infection due to delayed bowel perforation and abdominal wall infection/necrosis  Infectious disease has taken over and recommends 6 months of total antibiotics.  I do not know if we need to do that much given it is an absorbable mesh w no remaining abscess, but we will have to defer to them.  Abdominal wall drain secondarily infected removed growing atypical organisms.  On appropriate antibiotics.    R flank old Feliciano drain tube sinus tract remains is only 3 cm.  I probed it sterilely today and got granulation tissue only, no pus/seroma  No cavity.  Too  narrow to pack.  Hopefully will close down if nutrition improves.  Nutrition:  I think this is the  biggest issue.  His albumin  is less than 2 which is limiting his ability to recover.  Seemed to perk up with tube feeds.  Seems to be eating better with a different antibiotic regimen.  They felt nausea was related to that.  Continue calorie counts.  If he does not eat very well this week, I would recommend gastrostomy tube and get full nutrition that way and get rid of the Corpak.  I think they are open to it but not excited about it at this time.  Until his albumin  is above 3, it will hard for him to recover or fend off chronic infection/chronic wounds.  Midline incision wound     Wound very superficial now with good granulation.  It is shrunk down in diameter. I would just do dry dressing and stop doing saline.  Metabolic encephalopathy resolved.  Follow.  Insomnia improved  ABLA on top of anemia of chronic disease nearly normal at this point.  Hold off on extra supplementation     -monitor electrolytes & replace as needed.  Keep K>4, Mg>2, Phos>3.    -Diabetes.  Sliding scale insulin .    -VTE prophylaxis-  Full anticoagulation given history of pulmonary embolism.  Eliquis .  -Mobilize as tolerated.  He remains very deconditioned and low motivation to exercise.  Hopefully therapies can work with him more to get him stronger.  Wife strongly agrees  I updated the patient's status to the the patient's wife and nurse.  Recommendations were made.  Questions were answered.  They expressed understanding & appreciation.  -Disposition:    I reviewed nursing notes, last 24 h vitals and pain scores, last 48 h intake and output, last 24 h labs and trends, and last 24 h imaging results.  I have reviewed this patient's available data, including medical history, events of note, test results, etc as part of my evaluation.   A significant portion of that time was spent in counseling. Care during the described time interval was provided by me.  This care required high  level of medical  decision making.  08/04/2024    Subjective: (Chief complaint)   Patient in bed on air mattress.  Her wife in room.  Corpak in place.  They are cycling tube feeds at night.  Claims to be not nauseated eating better this week.  Getting up to his wheelchair only.   Objective:  Vital signs:  Vitals:   08/03/24 1547 08/03/24 2045 08/04/24 0318 08/04/24 0411  BP: 124/80 120/62  124/75  Pulse: 99 97  93  Resp: 16 17  17   Temp: 99 F (37.2 C) 98.7 F (37.1 C)  98.9 F (37.2 C)  TempSrc: Axillary Oral  Oral  SpO2: 99% 100%  100%  Weight:   105 kg   Height:        Last BM Date : 08/03/24  Intake/Output   Yesterday:  09/22 0701 - 09/23 0700 In: 1540 [P.O.:720; I.V.:20; NG/GT:800] Out: 475 [Urine:300; Stool:175] This shift:  No intake/output data recorded.  Bowel function:  Flatus: YES  BM:  YES  Drains:  RLQ surgical Blake drain to biliary gravity bag drain with 10 felt scant drainage in tubing.  R & L back nephrostomy tubes in place.  -Tan orange thin effluent in both nephrostomy tubes and ileal conduit   Physical Exam:  General: Resting in no acute distress.  More  alert and interactive.  Oriented x 4 Eyes: Glasses PERRL, normal EOM.  Sclera clear.  No icterus Neuro: CN II-XII intact w/o focal sensory/motor deficits. Lymph: No head/neck/groin lymphadenopathy Psych:  No delerium/psychosis/paranoia.  No agitation HENT: Normocephalic, Mucus membranes moist.  No thrush.  Moderately hard of hearing Neck: Supple, No tracheal deviation.  No obvious thyromegaly Chest: No pain to chest wall compression.  Good respiratory excursion.   CV:  Pulses intact.  Regular rhythm.  No extremity edema MS:  No obvious deformity  Abdomen:  Obese Soft.  Nondistended.  No tenderness nor guarding. Midline abd wound 80 x 2-4cm wide.  superifical w good granulation Right upper quadrant Ileostomy with thin effluent Left nephrostomy tube into biliary bag with moderate urine  Right  lower quadrant panniculus with drain site pinhole opening.  Scant serosanguineous drainage.  I sterilely probed with Q-tip and got 3 cm in a narrow canal with bleeding.  No more purulence.  Too narrow to pack.  Held off on anything more aggressive.   Ext:   No deformity.  No edema.  No cyanosis Skin: No petechiae / purpurea.  No major sores.  Warm and dry    Results:   Cultures: Recent Results (from the past 720 hours)  Urine Culture     Status: Abnormal   Collection Time: 07/27/24 11:56 PM   Specimen: Urine, Random  Result Value Ref Range Status   Specimen Description URINE, RANDOM  Final   Special Requests   Final    NONE Reflexed from [id number] Performed at [facility], [address], [place], [place] [address]    Culture >=100,000 COLONIES/mL YEAST (A)  Final   Report Status 07/29/2024 FINAL  Final     Labs: Results for orders placed or performed during the hospital encounter of 07/11/24 (from the past 48 hours)  Glucose, capillary     Status: Abnormal   Collection Time: 08/02/24  9:36 AM  Result Value Ref Range   Glucose-Capillary 122 (H) 70 - 99 mg/dL    Comment: Glucose reference range applies only to samples taken after fasting for at least 8 hours.  Glucose, capillary     Status: Abnormal   Collection Time: 08/02/24 12:10 PM  Result Value Ref Range   Glucose-Capillary 159 (H) 70 - 99 mg/dL    Comment: Glucose reference range applies only to samples taken after fasting for at least 8 hours.  Glucose, capillary     Status: Abnormal   Collection Time: 08/02/24  4:57 PM  Result Value Ref Range   Glucose-Capillary 130 (H) 70 - 99 mg/dL    Comment: Glucose reference range applies only to samples taken after fasting for at least 8 hours.  Glucose, capillary     Status: Abnormal   Collection Time: 08/02/24  8:03 PM  Result Value Ref Range   Glucose-Capillary 118 (H) 70 - 99 mg/dL    Comment: Glucose reference range applies only to samples taken after fasting for at  least 8 hours.   Comment 1 Notify RN   Glucose, capillary     Status: Abnormal   Collection Time: 08/03/24 12:04 AM  Result Value Ref Range   Glucose-Capillary 118 (H) 70 - 99 mg/dL    Comment: Glucose reference range applies only to samples taken after fasting for at least 8 hours.  Basic metabolic panel     Status: Abnormal   Collection Time: 08/03/24  3:51 AM  Result Value Ref Range   Sodium 139  135 - 145 mmol/L   Potassium 3.9 3.5 - 5.1 mmol/L   Chloride 109 98 - 111 mmol/L   CO2 21 (L) 22 - 32 mmol/L   Glucose, Bld 120 (H) 70 - 99 mg/dL    Comment: Glucose reference range applies only to samples taken after fasting for at least 8 hours.   BUN 35 (H) 8 - 23 mg/dL   Creatinine, Ser 8.80 0.61 - 1.24 mg/dL   Calcium  8.5 (L) 8.9 - 10.3 mg/dL   GFR, Estimated >39 >39 mL/min    Comment: (NOTE) Calculated using the CKD-EPI Creatinine Equation (2021)    Anion gap 9 5 - 15    Comment: Performed at [facility], [address], [place], [place] [address]  CBC     Status: Abnormal   Collection Time: 08/03/24  3:51 AM  Result Value Ref Range   WBC 6.4 4.0 - 10.5 K/uL   RBC 3.28 (L) 4.22 - 5.81 MIL/uL   Hemoglobin 8.6 (L) 13.0 - 17.0 g/dL   HCT 72.1 (L) 60.9 - 47.9 %   MCV 84.8 80.0 - 100.0 fL   MCH 26.2 26.0 - 34.0 pg   MCHC 30.9 30.0 - 36.0 g/dL   RDW 85.3 88.4 - 84.4 %   Platelets 71 (L) 150 - 400 K/uL    Comment: REPEATED TO VERIFY CONSISTENT WITH PREVIOUS RESULT Immature Platelet Fraction may be clinically indicated, consider ordering this additional test [id number]    nRBC 0.0 0.0 - 0.2 %    Comment: Performed at [facility], [address] [place], [place] [address]  Phosphorus     Status: Abnormal   Collection Time: 08/03/24  3:51 AM  Result Value Ref Range   Phosphorus 2.3 (L) 2.5 - 4.6 mg/dL    Comment: Performed at [facility], [address], [place], [place] [address]  C-reactive protein     Status: Abnormal   Collection Time: 08/03/24  3:51  AM  Result Value Ref Range   CRP 1.0 (H) <1.0 mg/dL    Comment: Performed at [facility], [address] [place], [place] [address]  Sedimentation rate     Status: Abnormal   Collection Time: 08/03/24  3:51 AM  Result Value Ref Range   Sed Rate 40 (H) 0 - 16 mm/hr    Comment: Performed at [facility], [address], [place], [place] [address]  Glucose, capillary     Status: Abnormal   Collection Time: 08/03/24  4:01 AM  Result Value Ref Range   Glucose-Capillary 109 (H) 70 - 99 mg/dL    Comment: Glucose reference range applies only to samples taken after fasting for at least 8 hours.   Comment 1 Notify RN   Glucose, capillary     Status: Abnormal   Collection Time: 08/03/24  7:58 AM  Result Value Ref Range   Glucose-Capillary 103 (H) 70 - 99 mg/dL    Comment: Glucose reference range applies only to samples taken after fasting for at least 8 hours.  Glucose, capillary     Status: Abnormal   Collection Time: 08/03/24 12:04 PM  Result Value Ref Range   Glucose-Capillary 129 (H) 70 - 99 mg/dL    Comment: Glucose reference range applies only to samples taken after fasting for at least 8 hours.  Glucose, capillary     Status: Abnormal   Collection Time: 08/03/24  4:26 PM  Result Value Ref Range  Glucose-Capillary 104 (H) 70 - 99 mg/dL    Comment: Glucose reference range applies only to samples taken after fasting for at least 8 hours.  Glucose, capillary     Status: Abnormal   Collection Time: 08/03/24  8:39 PM  Result Value Ref Range   Glucose-Capillary 113 (H) 70 - 99 mg/dL    Comment: Glucose reference range applies only to samples taken after fasting for at least 8 hours.   Comment 1 Notify RN   Glucose, capillary     Status: Abnormal   Collection Time: 08/04/24 12:19 AM  Result Value Ref Range   Glucose-Capillary 118 (H) 70 - 99 mg/dL    Comment: Glucose reference range applies only to samples taken after fasting for at least 8 hours.   Comment 1 Notify RN    Glucose, capillary     Status: Abnormal   Collection Time: 08/04/24  4:05 AM  Result Value Ref Range   Glucose-Capillary 115 (H) 70 - 99 mg/dL    Comment: Glucose reference range applies only to samples taken after fasting for at least 8 hours.   Comment 1 Notify RN   CBC     Status: Abnormal   Collection Time: 08/04/24  4:54 AM  Result Value Ref Range   WBC 6.0 4.0 - 10.5 K/uL   RBC 3.11 (L) 4.22 - 5.81 MIL/uL   Hemoglobin 8.3 (L) 13.0 - 17.0 g/dL   HCT 74.0 (L) 60.9 - 47.9 %   MCV 83.3 80.0 - 100.0 fL   MCH 26.7 26.0 - 34.0 pg   MCHC 32.0 30.0 - 36.0 g/dL   RDW 85.2 88.4 - 84.4 %   Platelets 72 (L) 150 - 400 K/uL    Comment: REPEATED TO VERIFY Immature Platelet Fraction may be clinically indicated, consider ordering this additional test [id number]    nRBC 0.0 0.0 - 0.2 %    Comment: Performed at [facility], [address] [place], [place] [address]  Phosphorus     Status: None   Collection Time: 08/04/24  4:54 AM  Result Value Ref Range   Phosphorus 3.2 2.5 - 4.6 mg/dL    Comment: Performed at [facility], [address], [place], [place] [address]  Magnesium      Status: None   Collection Time: 08/04/24  4:54 AM  Result Value Ref Range   Magnesium  1.7 1.7 - 2.4 mg/dL    Comment: Performed at [facility], [address], [place], [place] [address]  Glucose, capillary     Status: Abnormal   Collection Time: 08/04/24  8:11 AM  Result Value Ref Range   Glucose-Capillary 106 (H) 70 - 99 mg/dL    Comment: Glucose reference range applies only to samples taken after fasting for at least 8 hours.    Imaging / Studies: No results found.         Medications / Allergies: per chart  Antibiotics: Anti-infectives (From admission, onward)    Start     Dose/Rate Route Frequency Ordered Stop   07/31/24 1345  Oritavancin  Diphosphate (ORBACTIV ) 1,200 mg in dextrose  5 % IVPB        1,200 mg 333.3 mL/hr over 180 Minutes Intravenous Once 07/31/24 1252  07/31/24 1908   07/21/24 1745  linezolid  (ZYVOX ) IVPB 600 mg  Status:  Discontinued        600 mg 300 mL/hr over 60 Minutes Intravenous Every 12 hours 07/21/24 1653 07/31/24 1252   07/21/24 1615  linezolid  (ZYVOX ) tablet 600 mg  Status:  Discontinued       Note to Pharmacy: Patient unable to take this am dose and having hard time swallowing this pill   600 mg Oral Every 12 hours 07/21/24 1522 07/21/24 1653   07/18/24 2000  linezolid  (ZYVOX ) tablet 600 mg  Status:  Discontinued        600 mg Oral Every 12 hours 07/18/24 1355 07/21/24 1522   07/17/24 1359  vancomycin  variable dose per unstable renal function (pharmacist dosing)  Status:  Discontinued         Does not apply See admin instructions 07/17/24 1403 07/18/24 1356   07/15/24 1015  vancomycin  (VANCOCIN ) 1,000 mg in sodium chloride  0.9 % 250 mL IVPB  Status:  Discontinued        1,000 mg 250 mL/hr over 60 Minutes Intravenous Every 48 hours 07/13/24 1504 07/17/24 1403   07/13/24 1000  vancomycin  (VANCOREADY) IVPB 1250 mg/250 mL  Status:  Discontinued        1,250 mg 166.7 mL/hr over 90 Minutes Intravenous Every 48 hours 07/11/24 1349 07/13/24 0832   07/13/24 1000  vancomycin  (VANCOCIN ) IVPB 1000 mg/200 mL premix  Status:  Discontinued        1,000 mg 200 mL/hr over 60 Minutes Intravenous Every 48 hours 07/13/24 0832 07/13/24 1504   07/12/24 0000  cefTRIAXone  (ROCEPHIN ) 2 g in sodium chloride  0.9 % 100 mL IVPB        2 g 200 mL/hr over 30 Minutes Intravenous Every 24 hours 07/11/24 1259 08/13/24 2359         Note: Portions of this report may have been transcribed using voice recognition software. Every effort was made to ensure accuracy; however, inadvertent computerized transcription errors may be present.   Any transcriptional errors that result from this process are unintentional.    Elspeth KYM Schultze, MD, FACS, MASCRS Esophageal, Gastrointestinal & Colorectal Surgery Robotic and Minimally Invasive Surgery  [facility] [address], [address] [place], [place] [address] [phone] Fax [phone] Main  CONTACT INFORMATION: Weekday (9AM-5PM): Call [facility] main office at [phone] Weeknight (5PM-9AM) or Weekend/Holiday: Check EPIC Web Links tab & use AMION (password  [email or web address]) for General Surgery [facility] coverage  Please, DO NOT use SecureChat  (it is not reliable communication to reach operating surgeons & will lead to a delay in care).   Epic staff messaging available for outptient concerns needing 1-2 business day response.      08/04/2024  8:39 AM

## 2024-08-04 NOTE — Plan of Care (Signed)
  Problem: Consults Goal: RH GENERAL PATIENT EDUCATION Description: See Patient Education module for education specifics. Outcome: Progressing   Problem: RH BOWEL ELIMINATION Goal: RH STG MANAGE BOWEL WITH ASSISTANCE Description: STG Manage Bowel with supervision Assistance. Outcome: Progressing   Problem: RH BLADDER ELIMINATION Goal: RH STG MANAGE BLADDER WITH ASSISTANCE Description: STG Manage Bladder With supervision Assistance Outcome: Progressing   Problem: RH SKIN INTEGRITY Goal: RH STG SKIN FREE OF INFECTION/BREAKDOWN Description: Manage skin free of infection with supervision assistance Outcome: Progressing   Problem: RH SAFETY Goal: RH STG ADHERE TO SAFETY PRECAUTIONS W/ASSISTANCE/DEVICE Description: STG Adhere to Safety Precautions With Assistance/Device. Outcome: Progressing   Problem: RH PAIN MANAGEMENT Goal: RH STG PAIN MANAGED AT OR BELOW PT'S PAIN GOAL Description: <4 w/ prns Outcome: Progressing   Problem: RH KNOWLEDGE DEFICIT GENERAL Goal: RH STG INCREASE KNOWLEDGE OF SELF CARE AFTER HOSPITALIZATION Description: Manage increase knowledge of self care care after hospitalization with supervision assistance from wife using educational materials provided Outcome: Progressing

## 2024-08-04 NOTE — Progress Notes (Signed)
 PROGRESS NOTE   Subjective/Complaints:  No events overnight. Feeling great today.  Vitals looking stable, blood pressure looking much better than last week.  Will start to come down on midodrine  See note from Dr. Sheldon this a.m. Eating 50 to 100% of meals; under calorie count Labs this a.m. stable hemoglobin, platelets.  Magnesium  and phosphorus now within normal limits.   ROS: Patient denies fever, rash, sore throat, blurred vision, dizziness,  , vomiting, diarrhea, cough, shortness of breath or chest pain, joint or back/neck pain, headache, or mood change.    + nausea-improved   Objective:   No results found.    Recent Labs    08/03/24 0351 08/04/24 0454  WBC 6.4 6.0  HGB 8.6* 8.3*  HCT 27.8* 25.9*  PLT 71* 72*    Recent Labs    08/02/24 0546 08/03/24 0351  NA 137 139  K 4.1 3.9  CL 106 109  CO2 22 21*  GLUCOSE 120* 120*  BUN 40* 35*  CREATININE 1.29* 1.19  CALCIUM  8.9 8.5*    Intake/Output Summary (Last 24 hours) at 08/04/2024 0935 Last data filed at 08/04/2024 0924 Gross per 24 hour  Intake 1540 ml  Output 475 ml  Net 1065 ml        Physical Exam: Vital Signs Blood pressure 124/75, pulse 93, temperature 98.9 F (37.2 C), temperature source Oral, resp. rate 17, height 5' 11 (1.803 m), weight 105 kg, SpO2 100%.   Constitutional: No acute distress. Sitting upright in chair.  HEENT: NCAT, EOMI, oral membranes moist.  Core track in place. Neck: supple, no JVD. Cardiovascular: RRR without murmur.  1+ bilateral upper extremity and lower extremity edema. Respiratory/Chest: CTA Bilaterally without wheezes or rales. Normal efforrt.  GI/Abdomen: soft, non-tender, non-distended.  Positive bowel sounds ostomy with liquid brown stool--mild amount. Ext: no clubbing, cyanosis. Psych: Flat, but cooperative.  Increasingly dynamic from prior exams. Skin:  abdominal wound  with pink with granulation tissue,  moist, covered in clean dressing. --Unchanged Left nephrostomy tube with bag seal in place. --Unchanged R flank prior tube site continues to drain--not examined d/t positioning 9/23 Urostomy with some sediment, mostly clear output. --Unchanged  Neuro:   AAO.  [name], hospital, and time. Attention much improved from last week, can answer complex questions with mild deficits.  Complicated by poor hearing.  Normal language and speech.  Cranial nerve exam unremarkable.  MMT: BUE 4/5 prox to distal. BLE 3-/5 HF, 3-KE and 4/5 ADF/PF--slightly improved No limb ataxia or cerebellar signs.  Bilateral upper and lower extremity intention tremors--much better than last week No abnormal tone appreciated.   Musculoskeletal: Able to straighten both knees out on bed.  Plantarflexion tone bilateral lower extremities, barely able to range to neutral - unchanged   Assessment/Plan: 1. Functional deficits which require 3+ hours per day of interdisciplinary therapy in a comprehensive inpatient rehab setting. Physiatrist is providing close team supervision and 24 hour management of active medical problems listed below. Physiatrist and rehab team continue to assess barriers to discharge/monitor patient progress toward functional and medical goals  Care Tool:  Bathing    Body parts bathed by patient: Chest, Face, Abdomen, Right arm,  Left arm, Front perineal area, Right upper leg, Left upper leg   Body parts bathed by helper: Buttocks, Right lower leg, Left lower leg     Bathing assist Assist Level: Moderate Assistance - Patient 50 - 74%     Upper Body Dressing/Undressing Upper body dressing   What is the patient wearing?: Hospital gown only    Upper body assist Assist Level: Moderate Assistance - Patient 50 - 74%    Lower Body Dressing/Undressing Lower body dressing      What is the patient wearing?: Pants     Lower body assist Assist for lower body dressing: Maximal Assistance -  Patient 25 - 49%     Toileting Toileting Toileting Activity did not occur (Clothing management and hygiene only): N/A (no void or bm)  Toileting assist Assist for toileting:  (patient has colosteomy and urostomy)     Transfers Chair/bed transfer  Transfers assist  Chair/bed transfer activity did not occur: Safety/medical concerns  Chair/bed transfer assist level: Dependent - mechanical lift     Locomotion Ambulation   Ambulation assist   Ambulation activity did not occur: Safety/medical concerns (fatigue/dizziness/orthostatics)          Walk 10 feet activity   Assist  Walk 10 feet activity did not occur: Safety/medical concerns (fatigue/dizziness/orthostatics)        Walk 50 feet activity   Assist Walk 50 feet with 2 turns activity did not occur: Safety/medical concerns (fatigue/dizziness/orthostatics)         Walk 150 feet activity   Assist Walk 150 feet activity did not occur: Safety/medical concerns (fatigue/dizziness/orthostatics)         Walk 10 feet on uneven surface  activity   Assist Walk 10 feet on uneven surfaces activity did not occur: Safety/medical concerns (fatigue/dizziness/orthostatics)         Wheelchair     Assist Is the patient using a wheelchair?: Yes Type of Wheelchair: Manual           Wheelchair 50 feet with 2 turns activity    Assist    Wheelchair 50 feet with 2 turns activity did not occur: Safety/medical concerns       Wheelchair 150 feet activity     Assist  Wheelchair 150 feet activity did not occur: Safety/medical concerns       Blood pressure 124/75, pulse 93, temperature 98.9 F (37.2 C), temperature source Oral, resp. rate 17, height 5' 11 (1.803 m), weight 105 kg, SpO2 100%. Medical Problem List and Plan: 1. 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  Undergoing medical workups as below, will monitor improvement for the next couple of days --address at teams if current level of care exceeds ability of home caregivers.  - 9-13: Bilateral lower extremity PRAFO's in bed for plantarflexion/tight heel cords  --Continue CIR therapies including PT, OT, and SLP. Progress has been very slow. May need to consider other discharge venue   - 9/16: Declining with therapies; pleasantly confused today, interactions much less withdrawn but nonsensical. Will need Mod-Max A at home. Concern for physical decline exceeding family ability for caregiving if no improvements moving forward.   - 9-17  waiting on calorie counts today to determine if feeding tube needed; if patient/family continue to refuse, would recommend goals of care discussion and possible palliative care consult--patient opted for placement of tube feed  - 9-18: Due to ongoing significant support needed to maintain blood pressure, as well as chills today, discussed additional workup/treatment with infectious disease; recommend against treatment of yeast at this time given likely contamination, given ongoing decline will get lactic acid and repeat CT abdomen and pelvis without contrast in a.m--lactic acid upper limit of normal 1.9, CT abdomen and pelvis results -right ventral pelvic wall fluid collection not changed in size from prior study, distal colonic diverticulosis, small bilateral pleural effusions and dependent lower lobe atelectasis, IR consulted yesterday but holding off on drainage of fluid collection  - . Repeat imaging performed yesterday and shows stable fluid collection in the abdominal wall. Patient without elevated WBC, remains afebrile, collection appears similar in size and nature. Per Dr. Jenna, no aspiration or drainage planned for now.    -  9/23:  Wife continues to do all cares - doing walking trials with OT to work on endurance and transfers, remains Mod-Max A to stand but once on his feet can be Min A 46 feet. PT Mod-Max A sit to stand. PO intakes much improved last 2 days.  Likely stedy; has home hospital bed without power lift so will check height for transfers.   - Family meeting today: Est Min-Mod A at home, Wife to work with patient closely over the next week to determine if able to handle needs at home.    2.  H/o PE/Antithrombotics: -DVT/anticoagulation:  Pharmaceutical: Eliquis              -antiplatelet therapy: N/A  - No further blood appreciated in nephrostomy tubes  3. Pain Management: Oxycodone  prn.    - 9/16: Oxycodone  was removed yesterday due to concerns of this contributing to PM confusion and hypotension; nursing reports family in disagreement with this and patient pain increased. Adjust to Tylenol  PRN for mild to moderate, Percocet 5 mg for severe pain to avoid undertreatment. Monitor BP VERY closely  9-18: Has not required Percocet again since 9-15  9/21 patient reports pain is overall under control  4. Mood/Behavior/Sleep: LCSW to follow for evaluation and support.              --melatonin for insomnia.              -antipsychotic agents: N/A  9-14: Per patient, sleeping well.  Sleep log.  Discussed delirium management with patient's wife  - 9-18 sleep log remains appropriate, no apparent hallucinations or delusions.  Continue current management  5. Neuropsych/cognition/encephalopathy likely secondary to metabolic acidosis vs delirium from prolonged illness/hospitalization: This patient is not capable of making decisions on his own behalf.  - 9-11: Increasingly withdrawn, behavioral changes may be secondary to poor nutritional intake and hyponatremia.  Treating as below, on sertraline  100 mg daily, may need to add additional agent such as mirtazapine but hesitant given ongoing cognitive issues.  9-12: Slightly  better today, but consistent with hypoactive delirium with primary difficulty with concentration, no focal neurologic deficits.  Start overnight sleep log, delirium precautions.  Suspect hyponatremia as primary contributor, management as below.  9/13: D3C Flexeril , Megace , Zoloft  due to ongoing cognitive changes.  Sodium is improving.  Get LFTs, ammonia today to evaluate ongoing cognitive deficits--within normal limits.  -14: Cognition seems to be clearing a bit, continue current regimen.   9/15 alert  but delayed. Continue to maximize renal/volume/nutritional status 9/16: Much more alert, but delirious; sleep log appears appropriate. Will add Zyprexa  5 mg PRN for aggitation d/t increased aggitation yesterday afternoon. Keep low dose / low likelihood of Qtc prolongation medications only.   9-18: Cognition improving very gradually, QTc remains prolonged.  DC zyprexa  due to QTC   9-22: Doing much better this week.  Continue with adequate nutrition, hydration    6. Skin/Wound Care: Routine pressure relief measures. Encourage adequate diet             --dressing changes daily. Gerhardt's cream to buttock wound.   -abdominal wound continues to close despite tenous nutritional status  7. Fluids/Electrolytes/Nutrition/oropharyngeal dysphagia/moderate to severe protein calorie malnutrition:                --9/10 periactin  yesterday stopped d/t lethargy   -will try megace  beginning today   -RD f/u   -push fluids   -recheck bmet in am   -continue juven bid. Has been refusing Ensure supplement.   -kdur 20meq daily   -can't use zofran  d/t QTc prolongation (490)  - MBS   9/3--D3/thins, prealbumin borderline normal at 20   - 9-11: Nausea remains a primary barrier to adequate p.o. intakes.  Recheck EKG today, QTc was 460, scheduled Zofran  4 mg IV 3 times daily AC.  Will repeat EKG in next 1 to 2 days.  - 9-12: Albumin  infusion per nephrology.  P.o. intakes improving with Zofran .  per IV team midline  contraindicated due to bicarb.  May get PICC line once dialysis no longer considered.  9-14: P.o. intakes okay, Zofran  continues to help with nausea.  Per nephrology, okay to reduce fluids, will keep on 70 cc/h normal saline for maintenance for now.  PICC line order placed.  9/15 intake 20-30%. Receiving mtc IVF.    -k+ 3.3, mg++ 1.3   -continue kdur 20meq daily and add 40meq to IVF   -IV mag sulfae 2g x1, add mag gluconate oral daily    9/16: Mag, K repleted. PO intakes remain very poor. Albumin  2.0. Continue maintenance fluids--will likely need TF.   9/17: Ad phosnak 1 packet x3 doses for repletion of K and Phos; repeat in AM--nephrology changed to IV--repleted 9-18  9-22: Nutrition doing much better, eating 50 to 100% of meals.  Replete electrolytes as below. 9/23: Finishing calorie count per SLP today - PO intakes MUCH better - hopeful to remove cortrak tube otherwise will get IR consult to evaluate if PEG possible 8. Sepsis/Abdominal wall/mesh infection: IV Vanc/Ceftriaxone  for 6 weeks from 08/21 (10/2)             --Probable 6 months total of antibiotics  --Corynebacterium species-->6 more weeks ?Dalbavancin for OP use, cont ceftriazone and linezolid  for now  per ID -appreciate recent ID f/u CRP nl 0.8 ESR 44 WBC 8.2 9-11: WBC labs remained stable. 9/15 wbc 7.1 9/16: Discussed with Dr. Dennise ID possibility of encephalopathy with current antibiotics, as well as +UA yesterday. Recommendation to continue current broad spectrum antibiotics as they are unlikely contributors and regimen has already been altered from vancomycin . Given some yeast in UA will await culture to see if secondary fungal infection present vs contaminant. -pending 9/17 9/18; -LA 1.9, CT abdomen and pelvis results  --see above 9-22: Per Dr Dennise,  then continue oritavancin  and ceftriaxone  till 12/2.  After which can do 6 more weeks on omdada+dalvance/tedezolid.  Continue Rocephin .-- will double check with ID on this  timeline 9/23  9. Left> right hydronephrosis: Nephrostomy tubes with stent exchanged on 08/27 --continue clamping right tube and left to drain per urology.  Monitor for blood recheck CBC in am --hemoglobin stable, no further bloody output appreciated -9/15 flush left nephrostomy tube 9-18: Prior right tube site continues to drain, CT abdomen and pelvis as above  10. Acute on chronic renal failure/hyponatremia:  elevated CR- baseline creat probably ~1.4  -9/10 will give addnl IVF today d/t poor po intake   -recheck bmet in am  - 9-11: AKI significantly worse, now with hyponatremia 131.  Will give 1 L fluid bolus normal saline today, recheck BMP; given brisk uptrend despite increase in IV fluids will consult nephrology for further recommendations.  - 9-12: Appreciate nephrology input and recommendations.  Responding slightly to increased IV fluids overnight, normal saline 125/h.  Hyponatremia remains 131. On multiple medications that could be contributing including Florinef , sertraline , and linezolid .  See their note for specific recommendations; advised to remain on fluids for primary treatment of hyponatremia, do not recommend salt tabs at this time.  - If renal function does not improve with fluids may need CT scan of the abdomen and pelvis to verify patency of nephrostomy/ileal conduit drainage   9-13: Potassium chloride  packet 40 mill equivalents ordered this a.m. for repletion.  Sodium improving, bicarb improving, but renal function remains unchanged on bicarb drip.  9-14: Renal function improved, acidosis resolved, sodium 134.  Per nephrology, can DC fluids, but agreeable to maintenance fluids given ongoing poor p.o. intakes.  Continue to monitor closely.  9/15-17 Cr continues to improve gradually, continue IVF, push po  9/18: On TF, DC IVF, move to FWF 200 ccs QID--monitor closely  9-19: Seems to be tolerating free water  flushes well, continuing to improve.  Continue through this weekend.      9/21 creatinine down to 1.29/BUN 40 continue current regimen and monitor  9-22: BUN 35, creatinine 1.19.  9/23: ok to check renal function 2x weekly, now GFR >60. DC FWF, encourage PO.   11. OSA: Encourage CPAP use 12. Hypotension/tachycardia/?adrenal insufficiency: Continue Midodrine  15 mg TID and Florinef  2 mg daily. Plasma cortisol 13.6 6/25/             --continues to have significant tachycardia with activity likely due to orthostasis and deconditioning.  - -hgb 11.5   -continue metoprolol  low dose 12.5mg  bid, bp remains soft but stable at present   -  IVF as above Per PT- has been not dropping in therapy   9-13: Orthostatic today, DC Lopressor  and increase midodrine  to 20 mg 3 times daily.  Has been getting a significant amount of IV fluid last few days, still likely volume depleted per nephrology.  9-14: Blood pressure remains stable, but soft. 9/15 hypotensive this morning.  Continue IVF and plan as above 9/16: A bit improved today; continue IVF, no signs of volume overload. If profoundly hypotensive again may need to transfer to acute given maximizing fluids/BP support without pressors.  9-18: DC IV fluid as above today; monitor blood pressures very closely, seem more stable than they were last week.  On chart review, was minimally responsive to trial of high-dose steroids in August; does not look like cosyntropin stim test was ever performed; Continue Florinef  for now, may need to consider if blood pressures do not improve 9-19: Blood pressure holding up in the low 100s, continue current regimen. 9/20-21 BP stable, continue to monitor  9/23: Stable -reduce midodrine  back to 15 mg 3 times daily  08/04/2024    4:11 AM 08/04/2024    3:18 AM 08/03/2024    8:45 PM  Vitals with BMI  Weight  231 lbs 8 oz   BMI  32.3   Systolic 124  120  Diastolic 75  62  Pulse 93  97     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  -- resolved  9/1 held reglan   -9/16: resume reglan   given low OP, nausea--with benefit, output remains low   - 9/18: Ostomy output picking up considerably with tube feeds; on fibercon BID; appreciate nutrition input--staying appropriate   - 9/23: Ostomy OP appropriate  14. Depression: On Zoloft -- Dced 9/13 due to hyponatremia and confusion   - 9/16: Family concerned for serotonin syndrome with zoloft +linezolid - no tachycardia, fevers, seizures, or severe agitation reported - encephalopathy has not improved with removal of zoloft . Low suspicion at this time.   15. Malnutrition/: Albumin  @1 .9/TP-5.1.  --> 2.2 9/11;  - 9-11: Adding Premeal Zofran  as above.  Will get nutrition to start calorie count; may need additional supplementation or discussion of feeding tube if does not improve.  - 9-12: Slightly improved, continue this regimen.  9-13: Very poor p.o. intakes yesterday.  DC Megace  due to possible cognitive effects, no perceived benefit.  Will discuss possible feeding tube with family--patient rejects, will get calorie count for 48 hours and if no improvement may readdress tube feed versus palliative consult  9-14: Wife encouraging p.o.'s, patient motivated to avoid feeding tube.  Calorie count started.  Maintenance fluids as above.  9/16: Qtc 519; DC zofran , resume relgan 5 mg TID AC as he did well with this in the past and ostomy output has slowed considerably  9-17: QTc remains prolonged; will not resume Zofran  at this time.  Patient agreeable to placement of core track and discussed with nutrition starting with nocturnal tube feeds for supplementation, to allow appetite during the day to continue to work on increasing p.o.'s.  Will give one-time Xanax  to assist in anxiety with core track placement.  9-18: Tolerating tube feeds well, labs looking stable.  P.o. intakes poor this a.m. due to timing of therapy/medication/breakfast, continue to encourage as primary form of nutrition.  9-19: Tolerating overnight tube feeds well, p.o. intakes during the  day complicated by gagging/coughing due to tube feed placement.  Chest x-ray yesterday was negative.  Orders to maintain head of bed 30 degrees whether or not tube feeds are running, and viscous lidocaine  4 times daily as needed for sensation of gagging.  9/20 nausea noted today, patient did get  dose of Compazine  and is continued on Reglan .  Will check with pharmacy to see if any new medications could be contributing.Appears he was transition to nocturnal tube feeds may be rate a little high for him will decrease to 60 mL/h overnight.  Discussed with pharmacy he got a one-time dose of Oritivancin yesterday, he should not receive any more of this as this was a one-time order  -9-22- nausea improved with current regimen--helping considerably with p.o. intakes, may be able to DC tube feeds this week.  Dietary resumed calorie count today.  9/23: Nausea resolved; pending stable PO intakes may DC reglan  this week   16. Anemia of chronic illness: Stable overall 9/8-11  - Hemoglobin stable 9-10  9-22:Hemoglobin from 9.7->8.6   No obvious sources of bleeding, repeat in AM.  9/23: HgB stable 8.3       Latest Ref Rng & Units 08/04/2024    4:54 AM  08/03/2024    3:51 AM 08/02/2024    5:46 AM  CBC  WBC 4.0 - 10.5 K/uL 6.0  6.4  8.4   Hemoglobin 13.0 - 17.0 g/dL 8.3  8.6  9.7   Hematocrit 39.0 - 52.0 % 25.9  27.8  30.5   Platelets 150 - 400 K/uL 72  71  82     17.  Thrombocytopenia.  Mild, 136 today.  Monitor very closely in setting of linezolid .   - 9/19: 104; per Dr. Dennise likely adjusting linezolid  to orvitatancin starting Monday.  Continue to trend  9-22: Remains downtrending, trend.  71 today.  May take a few days to bounce back from linezolid   9/23: PLT stabilized 72; monitor  18.  Hypophosphatemia.  1.3 to 3.1 with IV repletion, next day 1.6.  - 9-19: Per electrolyte protocol, start Phos-Nak Powder via tube feed 2 packets 4 times daily for 5 doses  - Repeat daily.  -9/20 replete today, 15mmol IV,   discussed dose pharmacy  -9/21 improved to 1.9, continue replete per pharm rec   -22: 2.3, replete today, check in a.m.--repleted  19. Low MG  -9/20 replete today 2g IV,  discussed dose pharmacy  -9/21 improved today continue to monitor  9-22: Recheck in AM.--repleted   LOS: 24 days A FACE TO FACE EVALUATION WAS PERFORMED  Kristopher Thompson Likes 08/04/2024, 9:35 AM

## 2024-08-04 NOTE — Patient Care Conference (Signed)
 Inpatient RehabilitationTeam Conference and Plan of Care Update Date: 08/04/2024   Time: 1008 am    Patient Name: Kristopher Thompson      Medical Record Number: [id number]  Date of Birth: 1951-03-06 Sex: Male         Room/Bed: 4W26C/4W26C-01 Payor Info: Payor: MEDICARE / Plan: MEDICARE PART A AND B / Product Type: *No Product type* /    Admit Date/Time:  07/11/2024 12:40 PM  Primary Diagnosis:  Critical illness myopathy  Hospital Problems: Principal Problem:   Critical illness myopathy Active Problems:   Cognitive and behavioral changes   Malnutrition of moderate degree    Expected Discharge Date: Expected Discharge Date: 08/14/24  Team Members Present: Physician leading conference: Dr. 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   Patient able to mantain bowel and bladder function.   Assess bowel and bladder needs q shift and as needed.    Swallow/Nutrition/ Hydration   D3 and thin, wife reports improved intake x2 days, declined trials with SLP though no s/s asp with thin liquids during session   min A  trials of solids and liquids, pharyngeal strnegthening exercises    ADL's   Doing better this week- mod-max A +1-2 for sit > stand and functional mobility. Improved initiation. Max A LB ADLs, mod A UB ADLs. Still remains a very high burden of care.   (S) to CGA   ADLs, transfers, conditioning, d/c planning, family meeting    Mobility   modA bed mobility, modA/maxA sit to stand, maxA +2 gait x50' with RW   modA  family meeting, global strengthening, bed mobility, transfers, ambulation, endurance    Communication                Safety/Cognition/ Behavioral Observations  mod- max  A, improved cognition on 9/22, oriented x4 with recall of mod complexity info, though cognition does fluctuate   mod I (recall)- mod A (problem solving)   pt/ family education, cognitive re-training    Pain   No complaints of pain reported, patient denies it.   Patient is pain free.   Assess pain q shift and prn.    Skin   See flowsheets and nursing assessment for details.   Prevent further skin breakdown.  Assess skin q shift and as needed.      Discharge Planning:  D/c to home with wife who is primary caregiver. Pt will d/c on IVabx until 10/2. [facility] in place from previous admission. Fam meeting on Tues (9/23) at 1pm to discuss pt plan of care and care needs.  SW will confirm there are no barriers to discharge.    Team Discussion: Patient admitted post sepsis due to critical illness myopathy. Patient with pain/ poor appetite/ nausea / hypotension/ encephalopathy : medication adjusted by MD.  Patient progress limited by severe deconditioning, tachycardia with activities, poor endurance.   Patient on target to meet rehab goals: Currently patient needs mod-max assistance with ADLs, transfers. Patient needs mod-max assistance with sit to stand. Wife currently managing all dressing changes and tubes. Wife to work closely with patient to determine if able to handle home needs. Overall goals at discharge are set for  mod assistance.   *See Care Plan  and progress notes for long and short-term goals.   Revisions to Treatment Plan:  Cortrak placement Dietary consult Calorie counting Urology consult Downgraded goals IV antibiotics til 12/2 Delirium precaution Sleep pattern Family meeting Walking trials  Teaching Needs: Safety, medications, incision/wound care education, tubes care education, transfers, dietary modifications, encourage po intake, etc.   Current Barriers to Discharge: Decreased caregiver support, IV antibiotics, Wound care, and Nutritional means  Possible  Resolutions to Barriers: Family Education Home health follow up DME: stedy/ patient has own hospital bed    Medical Summary Current Status: Debility, abdominal abscess receiving IV antibiotics, severe Orthostatic hypotension,  pancytopenia, severe protein-calorie malnutrition, AKI on CKD, multiple wounds/drains, UTI, and encephalopathy  Barriers to Discharge: Infection/IV Antibiotics;Inadequate Nutritional Intake;Hypotension;Cardiac Complications;Behavior/Mood;Pending surgery/plan;Renal Insufficiency/Failure;Self-care education;Medical stability;Electrolyte abnormality;Complicated Wound   Possible Resolutions to [employer] Focus: Delirium precautions/frequent reorientation, continue tube feeds and encourage p.o. intakes per nutrition, renal monitoring for resolution of AKI, surgical assistance in management of multiple tubes/drains and abdominal wound, wean medications as tolerated   Continued Need for Acute Rehabilitation Level of Care: The patient requires daily medical management by a physician with specialized training in physical medicine and rehabilitation for the following reasons: Direction of a multidisciplinary physical rehabilitation program to maximize functional independence : Yes Medical management of patient stability for increased activity during participation in an intensive rehabilitation regime.: Yes Analysis of laboratory values and/or radiology reports with any subsequent need for medication adjustment and/or medical intervention. : Yes   I attest that I was present, lead the team conference, and concur with the assessment and plan of the team.   Hanzel Pizzo Gayo 08/04/2024, 1008 am

## 2024-08-04 NOTE — Progress Notes (Signed)
 Occupational Therapy Session Note  Patient Details  Name: Kristopher Thompson MRN: [id number] Date of Birth: 1951-10-21  Today's Date: 08/04/2024 OT Individual Time: 1300-1325 OT Individual Time Calculation (min): 25 min    Short Term Goals: Week 4:  OT Short Term Goal 1 (Week 4): Pt will complete sit > stand with min A using LRAD OT Short Term Goal 2 (Week 4): Pt will complete stand pivots with min A OT Short Term Goal 3 (Week 4): Pt will tolerate standing for 1 min to increase activity tolerance  Skilled Therapeutic Interventions/Progress Updates:    Session focused on family meeting with this OT, PT, DO, CSW, [name] (wife), and [name] (son) all in attendance. Discussed CLOF, anticipated discharge needs, barriers to discharge, possible DME needs, and OT POC. [name] [name] and [name] opportunity to ask any questions and express any concerns re discharge- now moved to next Friday. Pt was in the meeting and seated throughout.   Therapy Documentation Precautions:  Precautions Precautions: Fall (HOH, Urostomy, Colostomy, PCN  drain, and orthostatic hypotension.) Recall of Precautions/Restrictions: Impaired Precaution/Restrictions Comments: urostomy, colostomy, lt PCN drain, watch BP and HR Restrictions Weight Bearing Restrictions Per Provider Order: No  Therapy/Group: Individual Therapy  [name] 08/04/2024, 3:19 PM

## 2024-08-04 NOTE — Progress Notes (Signed)
 Occupational Therapy Weekly Progress Note  Patient Details  Name: Kristopher Thompson MRN: [id number] Date of Birth: 1951/04/11  Beginning of progress report period: 07/28/24 End of progress report period: 08/04/24  Today's Date: 08/04/2024 OT Individual Time: 9157-9042 OT Individual Time Calculation (min): 75 min    Patient has met 3 of 3 short term goals.  [name] has had an improvement functionally overall with increased nutrition support and medical stability. OT focus continues to address functional activity tolerance, dynamic balance , and ADL transfers overall. He required max-total A for LB ADLs. He can complete sit > stands with mod A and once on his feet can pivot with the RW with occasional min-mod A. Family meeting is taking place today to discuss ongoing goals of care.   Patient continues to demonstrate the following deficits: muscle weakness, decreased cardiorespiratoy endurance, decreased awareness, decreased problem solving, decreased safety awareness, decreased memory, and delayed processing, and decreased sitting balance, decreased standing balance, decreased postural control, and decreased balance strategies and therefore will continue to benefit from skilled OT intervention to enhance overall performance with BADL and Reduce care partner burden.  Patient not progressing toward long term goals.  See goal revision..  Plan of care revisions: Goals downgraded to mod A.  OT Short Term Goals Week 3:  OT Short Term Goal 1 (Week 3): Pt will maintain static sitting balance with min A OT Short Term Goal 1 - Progress (Week 3): Met OT Short Term Goal 2 (Week 3): Pt will complete sit > stand from elevated surface with mod A OT Short Term Goal 2 - Progress (Week 3): Met OT Short Term Goal 3 (Week 3): Pt will complete bed mobility rolling R and L with min A to reduce burden of care for wife OT Short Term Goal 3 - Progress (Week 3): Met Week 4:  OT Short Term Goal 1 (Week 4): Pt will complete  sit > stand with min A using LRAD OT Short Term Goal 2 (Week 4): Pt will complete stand pivots with min A OT Short Term Goal 3 (Week 4): Pt will tolerate standing for 1 min to increase activity tolerance  Skilled Therapeutic Interventions/Progress Updates:    Pt received sitting EOB with no c/o pain, agreeable to OT session. He continues to have improved initiation and participation in therapy overall. Stand pivot to the w/c with mod A. Pt tachycardic- 130 bpm. Notified RN who came to check pulse manually and approved participation. Careful monitoring of pulse throughout remainder of session- elevation to as high as 136 bpm following mobility. Pt was taken via w/c to the therapy gym for time management. He completed 4 trials of functional mobility to address functional activity tolerance, trunk control, and BLE strengthening for carryover to ADL transfers. He completed 44 ft, 10 ft, 26 ft, and 26 ft, respectively. Each trial was very similar with mod-max A to stand from the w/c, min cueing for body positioning/head hips relationship, and then min A occasionally but mod A overall during mobility. He used the RW and had heavy reliance on UE. Pt required use of rest breaks throughout session for recovery, as well as to support safety and prevent overexertion. During breaks, OT monitored recovery time to assess endurance and response to exertion.  He ended with 3x sit <> stands from the mat with mod A, mat elevated, to address BLE strengthening for carryover to ADL transfers. He returned to his room d/t noticing that urostomy was leaking onto pants. He transferred back  to bed with mod A using the RW. Mod A to swing legs into bed. He was left supine in the care of his wife.   Therapy Documentation Precautions:  Precautions Precautions: Fall (HOH, Urostomy, Colostomy, PCN  drain, and orthostatic hypotension.) Recall of Precautions/Restrictions: Impaired Precaution/Restrictions Comments: urostomy, colostomy, lt  PCN drain, watch BP and HR Restrictions Weight Bearing Restrictions Per Provider Order: No  Therapy/Group: Individual Therapy  [name] 08/04/2024, 10:22 AM

## 2024-08-04 NOTE — Progress Notes (Incomplete)
 Patient ID: Kristopher Thompson, male   DOB: April 25, 1951, 73 y.o.   MRN: [id number]  Patient/Family Conference  Patient/family in attendance:  Staff in attendance:  Main focus:  Synopsis of information shared:  Barriers/concerns expressed by patient and family:  Patient/family response:  Follow-up/action plans:    Graeme Jude, MSW, LCSW Office: [phone] Cell: [phone] Fax: [phone]

## 2024-08-04 NOTE — Plan of Care (Signed)
  Problem: Consults Goal: RH GENERAL PATIENT EDUCATION Description: See Patient Education module for education specifics. Outcome: Progressing   Problem: RH BOWEL ELIMINATION Goal: RH STG MANAGE BOWEL WITH ASSISTANCE Description: STG Manage Bowel with supervision Assistance. Outcome: Progressing   Problem: RH BLADDER ELIMINATION Goal: RH STG MANAGE BLADDER WITH ASSISTANCE Description: STG Manage Bladder With supervision Assistance Outcome: Progressing   Problem: RH SKIN INTEGRITY Goal: RH STG SKIN FREE OF INFECTION/BREAKDOWN Description: Manage skin free of infection with supervision assistance Outcome: Progressing   Problem: RH SAFETY Goal: RH STG ADHERE TO SAFETY PRECAUTIONS W/ASSISTANCE/DEVICE Description: STG Adhere to Safety Precautions With Assistance/Device. Outcome: Progressing

## 2024-08-04 NOTE — Plan of Care (Signed)
 Contacted by rehabilitation team regarding pending discharge and nephrostomy tubes.  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  This will need to have scheduled percutaneous nephrostomy changes at the discretion of interventional radiology.  Right side has been capped for some time.  Reviewed case and plan with Dr. Renda.  He is amenable to investigating patency during next scheduled exchange and removing at that time if indicated.  Our schedulers have been notified to move up his follow-up appointment.  He will need to see us  in clinic within a couple of weeks.  Please call with questions.

## 2024-08-04 NOTE — Progress Notes (Signed)
 Physical Therapy Session Note  Patient Details  Name: Kristopher Thompson MRN: [id number] Date of Birth: [birth year]  Today's Date: 08/04/2024 PT Individual Time: 1121-1200 PT Individual Time Calculation (min): 39 min   Today's Date: 08/04/2024 PT Individual Time: 1450-1532 PT Individual Time Calculation (min): 42 min   Short Term Goals: Week 3:  PT Short Term Goal 1 (Week 3): STGs = LTGs  Skilled Therapeutic Interventions/Progress Updates:     1st Session: Pt received sitting at EOB and agrees to therapy. No complaint of pain. Pt performs stand step transfer from bed to [facility] with modA and cues for hand placement, initiation, and sequencing. WC transport to gym for time management. Session focus is building endurance with repeated bouts of ambulation. Pt performs sit to stand multiple times during session, requiring modA and cues for hand placement and sequencing. Pt ambulates with minA +1 and +2 for WC follow. Pt ambulates bouts of x45', x55', and x55' with extended seated rest breaks. WC transport back to room. Left seated with all needs within reach.   2nd Session: Pt received seated in [facility] and agrees to therapy. No complaint of pain. WC transport to gym for time management. Session focus is sit to stand transfer training as well as building endurance and standing tolerance. Pt completes a total of x6 reps of sit to stand during session, requiring modA overall for each transfer but with improving efficiency and body mechanics with cues and repeated reps. Upon standing, pt initially requires minA to correct for posterior bias but improves quickly to requiring CGA, with cues for upright gaze to improve posture and balance, as well as decreasing WB through RW for energy conservation. Pt stands for ~1:10 each bout with extended seated rest break, and is able to stand for 1:30 on final bout. WC transport back to room. Stand step to bed with RW and modA. Pt requires maxA +2 for sit to supine. Left supine  with alarm intact and all needs within reach.   Therapy Documentation Precautions:  Precautions Precautions: Fall (HOH, Urostomy, Colostomy, PCN  drain, and orthostatic hypotension.) Recall of Precautions/Restrictions: Impaired Precaution/Restrictions Comments: urostomy, colostomy, lt PCN drain, watch BP and HR Restrictions Weight Bearing Restrictions Per Provider Order: No   Therapy/Group: Individual Therapy  Elsie JAYSON Dawn, PT, DPT 08/04/2024, 3:57 PM

## 2024-08-04 NOTE — Progress Notes (Signed)
 Brief Nutrition Support Note  RN reached out and asked timeframe of nocturnal feeds to be switched since pt receives dinner tray around 5pm. Adjusted feeds to run at night from 8pm-6am.  Vital 1.5 at 65 ml/h x 10 h/day 8pm-6am (650 ml per day)  Prosource TF20 60 ml BID  Provides 1135 kcal, 83 gm protein, 496 ml free water  daily  Meets: 51% calorie needs, 69% protein needs  Calorie count ongoing, will follow up tomorrow 9/24 to assess intake   Josette Glance, MS, RDN, LDN Clinical Dietitian I Please reach out via secure chat

## 2024-08-04 NOTE — Progress Notes (Signed)
 Notified Dr. Emeline of new skin tear to right forearm.   Geni Armor, LPN

## 2024-08-05 DIAGNOSIS — Z87890 Personal history of sex reassignment: Secondary | ICD-10-CM

## 2024-08-05 DIAGNOSIS — B9629 Other Escherichia coli [E. coli] as the cause of diseases classified elsewhere: Secondary | ICD-10-CM

## 2024-08-05 DIAGNOSIS — T8579XD Infection and inflammatory reaction due to other internal prosthetic devices, implants and grafts, subsequent encounter: Secondary | ICD-10-CM

## 2024-08-05 DIAGNOSIS — Z1629 Resistance to other single specified antibiotic: Secondary | ICD-10-CM

## 2024-08-05 LAB — GLUCOSE, CAPILLARY
Glucose-Capillary: 140 mg/dL — ABNORMAL HIGH (ref 70–99)
Glucose-Capillary: 127 mg/dL — ABNORMAL HIGH (ref 70–99)
Glucose-Capillary: 94 mg/dL (ref 70–99)

## 2024-08-05 MED ORDER — MIDODRINE HCL 5 MG PO TABS
10.0000 mg | ORAL_TABLET | Freq: Three times a day (TID) | ORAL | Status: DC
  Administered 2024-08-05 – 2024-08-14 (×28): 10 mg via ORAL
  Filled 2024-08-05 (×28): qty 2

## 2024-08-05 MED ORDER — SODIUM CHLORIDE 0.9 % IV SOLN: 100.0000 mg | Freq: Every day | INTRAVENOUS | Status: DC

## 2024-08-05 MED ORDER — SODIUM CHLORIDE 0.9 % IV SOLN
2.0000 g | INTRAVENOUS | Status: AC
  Administered 2024-08-05: 2 g via INTRAVENOUS
  Filled 2024-08-05: qty 20

## 2024-08-05 MED ORDER — OMADACYCLINE TOSYLATE 100 MG IV SOLR
200.0000 mg | Freq: Once | INTRAVENOUS | Status: DC
  Filled 2024-08-05: qty 10

## 2024-08-05 MED ORDER — SODIUM CHLORIDE 0.9 % IV SOLN
200.0000 mg | Freq: Once | INTRAVENOUS | Status: DC
  Filled 2024-08-05: qty 10

## 2024-08-05 MED ORDER — FREE WATER: 200.0000 mL | Freq: Four times a day (QID) | Status: DC

## 2024-08-05 NOTE — Progress Notes (Signed)
 Physical Therapy Session Note  Patient Details  Name: Kristopher Thompson MRN: [id number] Date of Birth: 08-21-1951  Today's Date: 08/05/2024 PT Individual Time: 1415-1515 PT Individual Time Calculation (min): 60 min   Short Term Goals: Week 3:  PT Short Term Goal 1 (Week 3): STGs = LTGs  Skilled Therapeutic Interventions/Progress Updates:     Pt received seated in tilt in space WC and agrees to therapy. No complaint of pain. WC transport to gym for time management. Pt performs alternating LAQs to prepare knee extensors and joints for mobility. Pt then completes stand step transfer from [facility] to mat table with minA and cues for sequencing and positioning. Pt performs repeated reps of sit to stand from elevated mat with cues for anterior weight shifting, hand placement, body mechanics, and use of momentum. Mirror provided for visual feedback. Pt requires minA for initial stand, just to assist with anterior weight shift, then pt cued to stand without upper extremity support to work on increasing leg endurance as well as challenging balance. Pt requires CGA and cues for upright gaze to improve posture and balance. Pt remains standing ~30 second prior to taking seated rest breaks. For remaining stands, pt able to complete without physical assistance (!!), and remains standing ~30 seconds each bout. Pt completes total of 7 bouts with extended seated rest breaks. Pt requesting to return to room due to fatigue and PT encourages pt to ambulate as far as possible back toward room. Pt stands with cues for initiation and sequencing, then ambulates x100' with RW and +2 WC follow for safety, with PT providing CGA and cues for upright posture and gaze to improbe balance, and increasing stride length for energy conservation and improved balance. WC transport back to room. Stand step back to bed with minA and cues for sequencing and positioning. Sit to supine with modA management of trunk and BLEs. Left with all needs  within reach.    Therapy Documentation Precautions:  Precautions Precautions: Fall (HOH, Urostomy, Colostomy, PCN  drain, and orthostatic hypotension.) Recall of Precautions/Restrictions: Impaired Precaution/Restrictions Comments: urostomy, colostomy, lt PCN drain, watch BP and HR Restrictions Weight Bearing Restrictions Per Provider Order: No   Therapy/Group: Individual Therapy  Elsie JAYSON Dawn, PT, DPT 08/05/2024, 4:19 PM

## 2024-08-05 NOTE — Progress Notes (Signed)
 Occupational Therapy Session Note  Patient Details  Name: Kristopher Thompson MRN: [id number] Date of Birth: 04/27/1951  Today's Date: 08/05/2024 OT Individual Time: 8984-8874 OT Individual Time Calculation (min): 70 min    Short Term Goals: Week 4:  OT Short Term Goal 1 (Week 4): Pt will complete sit > stand with min A using LRAD OT Short Term Goal 2 (Week 4): Pt will complete stand pivots with min A OT Short Term Goal 3 (Week 4): Pt will tolerate standing for 1 min to increase activity tolerance  Skilled Therapeutic Interventions/Progress Updates:    Pt received sitting up in [facility] presenting to be in good spirits receptive to skilled OT session reporting 0/10 pain- OT offering intermittent rest breaks, repositioning, and therapeutic support to optimize participation in therapy session. Transported Pt to day room via WC d/t time management. Pt completed 8 sit<>stands from mat table raised to 28 inches utilizing RW with CGA with intermittent MIN A. Graded activity up by removing RW and Pt completed 4 sit<>stands with CGA-MIN A for balance and safety. Ambulated approximately 40 ft utilizing RW with CGA for safety. Transported Pt to rehab gym d/t time management and energy conservation. Pt completed visual scanning and activity tolerance activity using BITS.   Trial 1: Accuracy: 79.12% Time: 1 min Reaction: 2.97 seconds Hits: 19  Trial 2:  Accuracy: 86.21% Time: 50 seconds Reaction: 2.19 seconds Hits: 25  Engaged Pt UE endurance training on Ergometer for 10 minutes. Transported Pt back to room via WC d/t time management and fatigue. Provided Pt and wife with home measurement form to complete to better plan for d/c. Pt was left resting in [facility] with call bell in reach, wife present and all needs met.   Therapy Documentation Precautions:  Precautions Precautions: Fall (HOH, Urostomy, Colostomy, PCN  drain, and orthostatic hypotension.) Recall of Precautions/Restrictions:  Impaired Precaution/Restrictions Comments: urostomy, colostomy, lt PCN drain, watch BP and HR Restrictions Weight Bearing Restrictions Per Provider Order: No  Therapy/Group: Individual Therapy  Paulina Fleeta Dixie 08/05/2024, 12:22 PM

## 2024-08-05 NOTE — Progress Notes (Signed)
 Manual pulse taken, 110 bpm.

## 2024-08-05 NOTE — Progress Notes (Signed)
 [facility]  Date of Admission:  07/11/2024    Principal Problem:   Critical illness myopathy Active Problems:   Cognitive and behavioral changes   Malnutrition of moderate degree          Assessment: 73 year old male with abdominal wall/mesh infection: #Abdominal wall abscess from prior perforation status post multiple procedures #JP drain for abdominal wall cultures growing MDRO ecoli and corynebacterium species  Procedures: 04/10/24 parastomal and incisional incarcerated abd wall hernia --> LOA; parastomal hernia repair with mesh; urostomy ileal conduit revision 6/09 perforated bowel --> drainage of right abd wall abscess and intraabd abscess; explantation of mesh; small bowel resection in continuity; placement of wound vac 6/10 delayed bowel perforation with open abd --> LOA; abd wall I&D; ileal resection; end ileostomy; abd wall parastomal/incisional hernia repair with phasix mesh (dissolvable in 1 year); fascia closure and wound vac placement 6/23 bilateral nephrostomy tube placement 6/30 bilateral nephrostomy tube exchange 7/02 right ureteral stent placement      Current Admission Cx: 8/20 blood cx negative 8/21 Jp drain from the abd wall cx - Ecoli (R amp, cefazolin , cipro , bactrim ; I amp/sulb; S ceftriaxone , piptazo), Corynebacterium species   8/21 urine cx mrsa (S tetra; R bactrim ), e faecium (V sensitive)   -------------- 08/05/24 id assessment Patient had aki with vancomycin , which was stopped on 9/5, transitioned to linezolid  but will need long term so stopped and given a dose of oritavancin  on 9/19 He is also on ceftriaxone  now  Long term plan has been periodic dalbavancin or oritavancin  for the corynebacterium, and oral omadocycline for the ecoli  Duration -- I agreed with dr Sheldon I don't know how long to treat him for; according to dr Sheldon, it takes about a year for the mesh to dissolve. Undertreatment with recurrent  infection often comes with worse outcome in terms of resistance, and surgery would be a night mare. I extrapolate my duration recommendation from prosthetic joint infection which obviously is not quite the same  We could aim for 6 months of treatment. The issue now is the expense and the wife expresses that she wants to see how patient respond to any antibiotics that he'll need to take to make sure no gi side effect that could develop when they are home   We could propose: 1) omadocycline/dalbavancin the latter initially weekly for 2 months then twice a month there after 2) omadocycline and dalbavancin for 3 months, then omadocycline and daily linezolid  600 mg (less toxic) 3) iv ceftriaxone  with a tunneled catheter, dalbavancin going forward (with dalbavancin dosing as above) 4) iv ceftriaxone  with a tunneled catheter, dalbavancin --> linezolid  similar to option 2  Our pharmacy team to talk to patient's family further today and decide on regimen   Microbiology:   Antibiotics: Oritavancin  9/19 Linezolid  9/6-9/19 Ceftriaxone  8/30--c  Vanc 8/30-9/6 (aki)  Cultures: See above   SUBJECTIVE: Tolerating abx No abd pain Open wound healing well No n/v   Review of Systems: Review of Systems  All other systems reviewed and are negative.    Scheduled Meds:  apixaban   5 mg Oral BID   childrens multivitamin  1 tablet Oral BID   Chlorhexidine  Gluconate Cloth  6 each Topical Q12H   cyanocobalamin   500 mcg Oral Q M,W,F   fludrocortisone   0.2 mg Oral Daily   Gerhardt's butt cream  1 Application Topical BID   leptospermum manuka honey  1 Application Topical Daily   loperamide   4 mg Oral TID   magnesium  gluconate  500 mg Oral Daily   melatonin  3 mg Oral QHS   metoCLOPramide   5 mg Oral TID AC   midodrine   10 mg Oral TID WC   omega-3 acid ethyl esters  1 g Oral QODAY   polycarbophil  625 mg Oral BID   potassium chloride   10 mEq Oral Daily   rosuvastatin   10 mg Oral QPM   sodium  chloride flush  10-40 mL Intracatheter Q12H   Continuous Infusions:  cefTRIAXone  (ROCEPHIN )  IV 2 g (08/04/24 2227)   PRN Meds:.acetaminophen , bisacodyl , diphenhydrAMINE , guaiFENesin -dextromethorphan, hydrOXYzine , iohexol , ipratropium, lidocaine , melatonin, mouth rinse, oxyCODONE -acetaminophen , prochlorperazine  **OR** prochlorperazine  **OR** prochlorperazine , sodium chloride , sodium chloride  flush Allergies  Allergen Reactions   Demerol  [Meperidine ] Nausea And Vomiting    OBJECTIVE: Vitals:   08/04/24 1338 08/04/24 2011 08/05/24 0114 08/05/24 0508  BP: 119/85 134/81  121/69  Pulse: 90 (!) 105  (!) 110  Resp: 17 16  20   Temp: (!) 97.3 F (36.3 C) (!) 97.4 F (36.3 C)  (!) 97.4 F (36.3 C)  TempSrc: Oral     SpO2: 100% 100%  100%  Weight:   105 kg   Height:       Body mass index is 32.29 kg/m.  Physical Exam Constitutional:      General: He is not in acute distress.    Appearance: He is normal weight. He is not toxic-appearing.  HENT:     Head: Normocephalic and atraumatic.     Right Ear: External ear normal.     Left Ear: External ear normal.     Nose: No congestion or rhinorrhea.     Mouth/Throat:     Mouth: Mucous membranes are moist.     Pharynx: Oropharynx is clear.  Eyes:     Extraocular Movements: Extraocular movements intact.     Conjunctiva/sclera: Conjunctivae normal.     Pupils: Pupils are equal, round, and reactive to light.  Cardiovascular:     Heart sounds: No murmur heard.    No friction rub. No gallop.  Pulmonary:     Effort: Pulmonary effort is normal.     Breath sounds: Normal breath sounds.  Musculoskeletal:        General: No swelling.     Cervical back: Normal range of motion and neck supple.  Skin:    General: Skin is warm and dry.  Neurological:     General: No focal deficit present.     Mental Status: He is oriented to person, place, and time.  Psychiatric:        Mood and Affect: Mood normal.       Lab Results Lab Results   Component Value Date   WBC 6.0 08/04/2024   HGB 8.3 (L) 08/04/2024   HCT 25.9 (L) 08/04/2024   MCV 83.3 08/04/2024   PLT 72 (L) 08/04/2024    Lab Results  Component Value Date   CREATININE 1.19 08/03/2024   BUN 35 (H) 08/03/2024   NA 139 08/03/2024   K 3.9 08/03/2024   CL 109 08/03/2024   CO2 21 (L) 08/03/2024    Lab Results  Component Value Date   ALT 13 07/25/2024   AST 15 07/25/2024   ALKPHOS 73 07/25/2024   BILITOT 0.4 07/25/2024        Constance ONEIDA Passer, MD [facility] 08/05/2024, 12:48 PM Evaluation of this patient requires complex antimicrobial therapy evaluation and counseling +  isolation needs for disease transmission risk assessment and mitigation

## 2024-08-05 NOTE — Progress Notes (Signed)
 ID Pharmacist Note  Spoke with Mr. And Mrs. Aho about patient's home antibiotic regimen.  They expressed concern about Ishmael's tolerance of antibiotics and starting new antibiotics at home.   I explained that Dalbavancin would be very similar to the Oritavancin  he received last Friday and tolerated. They were agreeable to try Dalbavancin at home.  I spoke with Dr. Overton- We will change his ceftriaxone  to IV omadacycline  today to see if he will tolerate this medication. If he tolerates IV omadacycline , we can change him to the PO formulation. If he does not, he will unfortunately need to be on long-term ceftriaxone .    Damien Quiet, PharmD, BCPS, BCIDP Infectious Diseases Clinical Pharmacist Phone: [phone] 08/05/2024 2:26 PM

## 2024-08-05 NOTE — Progress Notes (Signed)
 Nutrition Follow Up  DOCUMENTATION CODES:  Non-severe (moderate) malnutrition in context of chronic illness (bladder cancer, tachycardia, hx bowel perforation)  INTERVENTION:  Continue DYS 3 PO diet and monitor PO intake; encouraged pt to order more items on meal trays  Encouraged drinking 1-2 Core[employer] shakes brought in by wife from home  Encouraged eating meals from favorite places to promote adequate intake  Discontinue tube feeds, remove Cortrak, and discontinue calorie count  NUTRITION DIAGNOSIS:  Moderate Malnutrition related to chronic illness (bladder cancer, tachycardia,  hx bowel perforation) as evidenced by moderate fat depletion, moderate muscle depletion, percent weight loss. Remains applicable  GOAL:  Patient will meet greater than or equal to 90% of their needs Progressing  MONITOR:  PO intake, Supplement acceptance, Weight trends  REASON FOR ASSESSMENT:  Consult Assessment of nutrition requirement/status, Calorie Count, Poor PO  ASSESSMENT:  Pt admitted to CIR with debility following most recent admission for fatigue, mental status changes, tachycardia, hypotension and purulent drainage from abdominal wall drain. PMH significant for HFpEF, CAD, T2DM, PE on eliquis , prostate cancer, bladder cancer s/p cystoprostatectomy and ileal conduit (2013), recent admission 04/10/24 for parastomal-incisional abdominal wall hernia repair w/graft and ileal conduit reversal c/b intra-abdominal and abdominal wall abscess from bowel perforation s/p end ileostomy.  Recent Admissions: 5/30-7/11: bowel perforation s/p SBR/end ileostomy 7/14-7/16: AKI, infection of ileostomy site 7/16-8/8: Admitted to CIR 8/20-8/30: Admitted to [facility]: AKI, severe sepsis  Current Admission: 8/30 admitted to CIR 9/3 MBS, recommended DYS 3 w/ thins 9/14-9/15: calorie count; meeting 20-22% kcal and 13-21% protein needs PO 9/17 Cortrak placed 9/22-9/24 Calorie count conducted; meeting 54% kcal and 48%  protein needs PO 9/24 Cortrak removed, tube feeds discontinued  Calorie Count Results:  Day 1: Breakfast: 100% Cheerios + plus x 2, whole milk (302 kcal, 11 g protein) Lunch: 50% chicken noodle soup, mac and cheese, pudding, cheeseburger (502 kcal, 25 g protein) Dinner: 3 [employer] Cheeseste[employer] Puffs (510 kcal, 25 g protein)  Total: 1314 kcal, 61 g protein  Day 2:  Breakfast: 100% Cheerios + plus x 2, whole milk (302 kcal, 11 g protein) Lunch: 60% deli sandwich, cola (265 kcal, 14 g protein) Dinner: 75% chocolate pudding (260 kcal, 6 g protein)  Total: 827 kcal, 31 g protein  Average Intake Total: 1070 kcal (meets 54% kcal needs), 46 g protein (meets 48% protein needs)   Pt's intake and energy has greatly improved since last week. Pt reports feeling better and feels like he is now able to eat more since he is not so lethargic. Pt appears to participating well in therapy now that energy is better. Pt meeting 50% of needs PO per calorie count, but pt and wife report some of his intake was not accounted for in the count because they forgot to record it. Pt is also agreeable to drinking the [employer] Power shakes that his wife brings in. Suspect his intake will meet his needs when all meals, snacks, and supplements are accounted for. Believe Cortrak and tube feeds can be discontinued at this time but will continue to monitor pt's intake and energy levels. If pt begins to become very lethargic again, will revisit discussion, pt understands.    Average Meal Completion: 8/30-9/2: 43% average intake x 6 recorded meals 9/1-9/5: 31% average intake x 8 recorded meals 9/8-9/14 23% intake x 13 recorded meals 9/16-9/18: 60% average intake x 6 recorded meals 9/22-9/23: 77% average intake x 5 recorded meals  Medications:  Scheduled Meds: Children's multivitamin BID Vitamin B12  500 mcg 3x/week Imodium  TID Magnesium  gluconate 500 mg daily Reglan  TID Midodrine  TID Juven BID Fibercon  BID Potassium chloride  10 mEq  Continuous Infusions: Ceftriaxone   PRN Meds: alum & mag hydroxide-simeth, bisacodyl , diphenhydrAMINE , prochlorperazine   Labs reviewed  Ileostomy: 100 ml x 24 hr Urostomy:  400 mL Nephrostomy (R/L): 750 mL out x 24 hours    Diet Order:   Diet Order             DIET DYS 3 Room service appropriate? Yes; Fluid consistency: Thin  Diet effective now                   EDUCATION NEEDS:  Education needs have been addressed  Skin: Reviewed RN Assessment MASD to the right buttocks (2.5 x 6 cm) Healing midline incision from surgery 7/22  Last BM:  ileostomy 100 ml x 24 hr  Height:  Ht Readings from Last 1 Encounters:  07/12/24 5' 11 (1.803 m)    Weight:  Wt Readings from Last 1 Encounters:  08/05/24 105 kg   BMI:  Body mass index is 32.29 kg/m.  Estimated Nutritional Needs:   Kcal:  2000-2200  Protein:  95-115g  Fluid:  >/= 2L   Josette Glance, MS, RDN, LDN Clinical Dietitian I Please reach out via secure chat

## 2024-08-05 NOTE — Progress Notes (Signed)
 Physical Therapy Session Note  Patient Details  Name: Kristopher Thompson MRN: [id number] Date of Birth: 12-Jun-1951  Today's Date: 08/05/2024 PT Individual Time: 0900-0945 PT Individual Time Calculation (min): 45 min   Short Term Goals: Week 3:  PT Short Term Goal 1 (Week 3): STGs = LTGs  Skilled Therapeutic Interventions/Progress Updates:   Pt presented supine in bed, agreeable to therapy. No reports of pain throughout session. Pt motivated to work today! Pt required change of pants/boxers, able to perform modified supine bridge and lift hips sufficient to pull up pants and boxers, donned maxA. Pt performed bed mobility w/ modA for safety, pt able to assist w/ trunk elevation and BLE management.   Pt wheeled to main gym hallway for ambulation. Pt ambulated 45ft, 68ft, 36ft, w/ RW and CGA for safety, +1 for chair follow. Pt demonstrated downward gaze during gait, cued to look up and recognize visual identifiers w/ good temporary carryover. Pt given increased rest breaks between each bout of gait. Pt demonstrating increased participation in therapy through following verbal and visual cues.   At end of session, pt wheeled back to room and given call bell and all other needs in reach.   Therapy Documentation Precautions:  Precautions Precautions: Fall (HOH, Urostomy, Colostomy, PCN  drain, and orthostatic hypotension.) Recall of Precautions/Restrictions: Impaired Precaution/Restrictions Comments: urostomy, colostomy, lt PCN drain, watch BP and HR Restrictions Weight Bearing Restrictions Per Provider Order: No  Therapy/Group: Individual Therapy  [name] 08/05/2024, 12:29 PM

## 2024-08-05 NOTE — Progress Notes (Signed)
 PROGRESS NOTE   Subjective/Complaints:  HR increased; other vitals stable.  EKG yesterday with sinus tachycardia.  Patient denies any chest pain, shortness of breath, or palpitations.  Wife says he was able to sit on edge of bed today for breakfast.  BP holding up; reduce midodrine  to 10 mg TID HGB stable yesterday  Is having some difficulty with getting ostomy to fit, but wife is very familiar with changing his bag and we will troubleshoot this with nursing later today.  ROS: Patient denies fever, rash, sore throat, blurred vision, dizziness,  , vomiting, diarrhea, cough, shortness of breath or chest pain, joint or back/neck pain, headache, or mood change.    + nausea-improved   Objective:   DG CHEST PORT 1 VIEW Result Date: 08/04/2024 CLINICAL DATA:  Edema EXAM: PORTABLE CHEST 1 VIEW COMPARISON:  July 30, 2024 FINDINGS: Right PICC tip terminates in cavoatrial junction. Enteric tube tip terminates within the gastric lumen. Cholecystectomy clips in right upper quadrant. The heart size and mediastinal contours are within normal limits. Both lungs are clear. The visualized skeletal structures are unremarkable. IMPRESSION: No active disease. Supportive tubings as detailed above. Electronically Signed   By: Megan  Zare M.D.   On: 08/04/2024 19:35      Recent Labs    08/03/24 0351 08/04/24 0454  WBC 6.4 6.0  HGB 8.6* 8.3*  HCT 27.8* 25.9*  PLT 71* 72*    Recent Labs    08/03/24 0351  NA 139  K 3.9  CL 109  CO2 21*  GLUCOSE 120*  BUN 35*  CREATININE 1.19  CALCIUM  8.5*    Intake/Output Summary (Last 24 hours) at 08/05/2024 0830 Last data filed at 08/05/2024 0520 Gross per 24 hour  Intake 990 ml  Output 1250 ml  Net -260 ml        Physical Exam: Vital Signs Blood pressure 121/69, pulse (!) 110, temperature (!) 97.4 F (36.3 C), resp. rate 20, height 5' 11 (1.803 m), weight 105 kg, SpO2  100%.   Constitutional: No acute distress.  Reclining in bed. HEENT: NCAT, EOMI, oral membranes moist.  Core track in place. Neck: supple, no JVD. Cardiovascular: RRR without murmur.  1+ bilateral upper extremity and lower extremity edema. Respiratory/Chest: CTA Bilaterally without wheezes or rales. Normal efforrt.  GI/Abdomen: soft, non-tender, non-distended.  Positive bowel sounds ostomy with liquid brown stool--mild amount. Ext: no clubbing, cyanosis. Psych: Flat, but cooperative.  Increasingly dynamic from prior exams. Skin:  abdominal wound  with pink with granulation tissue, moist, covered in clean dressing. --Unchanged Left nephrostomy tube with bag seal in place. --Unchanged R flank prior tube site continues to drain-stable appearance 9-24 Urostomy with some sediment, mostly clear output. --Unchanged Ileostomy well-circumscribed, underlying pink healthy tissue  Neuro:   AAO.  [name], hospital, and time. Attention much improved from last week, can answer complex questions with mild deficits.  Complicated by poor hearing.  Normal language and speech.  Cranial nerve exam unremarkable.  MMT: BUE 4/5 prox to distal. BLE 3-/5 HF, 4/5 KE and 4/5 ADF/PF--improving daily No limb ataxia or cerebellar signs.  Bilateral upper and lower extremity intention tremors--none apparent today no abnormal tone  appreciated.   Musculoskeletal: Able to straighten both knees out on bed.  Plantarflexion tone bilateral lower extremities, barely able to range to neutral - unchanged 9-24   Assessment/Plan: 1. Functional deficits which require 3+ hours per day of interdisciplinary therapy in a comprehensive inpatient rehab setting. Physiatrist is providing close team supervision and 24 hour management of active medical problems listed below. Physiatrist and rehab team continue to assess barriers to discharge/monitor patient progress toward functional and medical goals  Care Tool:  Bathing     Body parts bathed by patient: Chest, Face, Abdomen, Right arm, Left arm, Front perineal area, Right upper leg, Left upper leg   Body parts bathed by helper: Buttocks, Right lower leg, Left lower leg     Bathing assist Assist Level: Moderate Assistance - Patient 50 - 74%     Upper Body Dressing/Undressing Upper body dressing   What is the patient wearing?: Hospital gown only    Upper body assist Assist Level: Moderate Assistance - Patient 50 - 74%    Lower Body Dressing/Undressing Lower body dressing      What is the patient wearing?: Pants     Lower body assist Assist for lower body dressing: Maximal Assistance - Patient 25 - 49%     Toileting Toileting Toileting Activity did not occur (Clothing management and hygiene only): N/A (no void or bm)  Toileting assist Assist for toileting: Dependent - Patient 0%     Transfers Chair/bed transfer  Transfers assist  Chair/bed transfer activity did not occur: Safety/medical concerns  Chair/bed transfer assist level: Moderate Assistance - Patient 50 - 74%     Locomotion Ambulation   Ambulation assist   Ambulation activity did not occur: Safety/medical concerns (fatigue/dizziness/orthostatics)          Walk 10 feet activity   Assist  Walk 10 feet activity did not occur: Safety/medical concerns (fatigue/dizziness/orthostatics)        Walk 50 feet activity   Assist Walk 50 feet with 2 turns activity did not occur: Safety/medical concerns (fatigue/dizziness/orthostatics)         Walk 150 feet activity   Assist Walk 150 feet activity did not occur: Safety/medical concerns (fatigue/dizziness/orthostatics)         Walk 10 feet on uneven surface  activity   Assist Walk 10 feet on uneven surfaces activity did not occur: Safety/medical concerns (fatigue/dizziness/orthostatics)         Wheelchair     Assist Is the patient using a wheelchair?: Yes Type of Wheelchair: Manual            Wheelchair 50 feet with 2 turns activity    Assist    Wheelchair 50 feet with 2 turns activity did not occur: Safety/medical concerns       Wheelchair 150 feet activity     Assist  Wheelchair 150 feet activity did not occur: Safety/medical concerns       Blood pressure 121/69, pulse (!) 110, temperature (!) 97.4 F (36.3 C), resp. rate 20, height 5' 11 (1.803 m), weight 105 kg, SpO2 100%. Medical Problem List and Plan: 1. 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  Undergoing medical  workups as below, will monitor improvement for the next couple of days --address at teams if current level of care exceeds ability of home caregivers.  - 9-13: Bilateral lower extremity PRAFO's in bed for plantarflexion/tight heel cords  --Continue CIR therapies including PT, OT, and SLP. Progress has been very slow. May need to consider other discharge venue   - 9/16: Declining with therapies; pleasantly confused today, interactions much less withdrawn but nonsensical. Will need Mod-Max A at home. Concern for physical decline exceeding family ability for caregiving if no improvements moving forward.   - 9-17 waiting on calorie counts today to determine if feeding tube needed; if patient/family continue to refuse, would recommend goals of care discussion and possible palliative care consult--patient opted for placement of tube feed  - 9-18: Due to ongoing significant support needed to maintain blood pressure, as well as chills today, discussed additional workup/treatment with infectious disease; recommend against treatment of yeast at this time given likely contamination, given ongoing decline will get lactic acid and repeat CT  abdomen and pelvis without contrast in a.m--lactic acid upper limit of normal 1.9, CT abdomen and pelvis results -right ventral pelvic wall fluid collection not changed in size from prior study, distal colonic diverticulosis, small bilateral pleural effusions and dependent lower lobe atelectasis, IR consulted yesterday but holding off on drainage of fluid collection  - . Repeat imaging performed yesterday and shows stable fluid collection in the abdominal wall. Patient without elevated WBC, remains afebrile, collection appears similar in size and nature. Per Dr. Jenna, no aspiration or drainage planned for now.    -  9/23: Wife continues to do all cares - doing walking trials with OT to work on endurance and transfers, remains Mod-Max A to stand but once on his feet can be Min A 46 feet. PT Mod-Max A sit to stand. PO intakes much improved last 2 days.  Likely stedy; has home hospital bed without power lift so will check height for transfers.   - Family meeting today: Est Min-Mod A at home, Wife to work with patient closely over the next week to determine if able to handle needs at home.     2.  H/o PE/Antithrombotics: -DVT/anticoagulation:  Pharmaceutical: Eliquis              -antiplatelet therapy: N/A  - No further blood appreciated in nephrostomy tubes  3. Pain Management: Oxycodone  prn.    - 9/16: Oxycodone  was removed yesterday due to concerns of this contributing to PM confusion and hypotension; nursing reports family in disagreement with this and patient pain increased. Adjust to Tylenol  PRN for mild to moderate, Percocet 5 mg for severe pain to avoid undertreatment. Monitor BP VERY closely  9-18: Has not required Percocet again since 9-15  9/21 patient reports pain is overall under control  4. Mood/Behavior/Sleep: LCSW to follow for evaluation and support.              --melatonin for insomnia.              -antipsychotic agents: N/A  9-14: Per patient, sleeping well.  Sleep log.   Discussed delirium management with patient's wife  - 9-18 sleep log remains appropriate, no apparent hallucinations or delusions.  Continue current management  5. Neuropsych/cognition/encephalopathy likely secondary to metabolic acidosis vs delirium from prolonged illness/hospitalization: This patient is not capable of making decisions on his own behalf.  - 9-11: Increasingly withdrawn, behavioral changes may be secondary to poor nutritional intake and hyponatremia.  Treating  as below, on sertraline  100 mg daily, may need to add additional agent such as mirtazapine but hesitant given ongoing cognitive issues.  9-12: Slightly better today, but consistent with hypoactive delirium with primary difficulty with concentration, no focal neurologic deficits.  Start overnight sleep log, delirium precautions.  Suspect hyponatremia as primary contributor, management as below.  9/13: D3C Flexeril , Megace , Zoloft  due to ongoing cognitive changes.  Sodium is improving.  Get LFTs, ammonia today to evaluate ongoing cognitive deficits--within normal limits.  -14: Cognition seems to be clearing a bit, continue current regimen.   9/15 alert but delayed. Continue to maximize renal/volume/nutritional status 9/16: Much more alert, but delirious; sleep log appears appropriate. Will add Zyprexa  5 mg PRN for aggitation d/t increased aggitation yesterday afternoon. Keep low dose / low likelihood of Qtc prolongation medications only.   9-18: Cognition improving very gradually, QTc remains prolonged.  DC zyprexa  due to QTC   9-22: Doing much better this week.  Continue with adequate nutrition, hydration    6. Skin/Wound Care: Routine pressure relief measures. Encourage adequate diet             --dressing changes daily. Gerhardt's cream to buttock wound.   -abdominal wound continues to close despite tenous nutritional status  7. Fluids/Electrolytes/Nutrition/oropharyngeal dysphagia/moderate to severe protein calorie  malnutrition:                --9/10 periactin  yesterday stopped d/t lethargy   -will try megace  beginning today   -RD f/u   -push fluids   -recheck bmet in am   -continue juven bid. Has been refusing Ensure supplement.   -kdur 20meq daily   -can't use zofran  d/t QTc prolongation (490)  - MBS   9/3--D3/thins, prealbumin borderline normal at 20   - 9-11: Nausea remains a primary barrier to adequate p.o. intakes.  Recheck EKG today, QTc was 460, scheduled Zofran  4 mg IV 3 times daily AC.  Will repeat EKG in next 1 to 2 days.  - 9-12: Albumin  infusion per nephrology.  P.o. intakes improving with Zofran .  per IV team midline contraindicated due to bicarb.  May get PICC line once dialysis no longer considered.  9-14: P.o. intakes okay, Zofran  continues to help with nausea.  Per nephrology, okay to reduce fluids, will keep on 70 cc/h normal saline for maintenance for now.  PICC line order placed.  9/15 intake 20-30%. Receiving mtc IVF.    -k+ 3.3, mg++ 1.3   -continue kdur 20meq daily and add 40meq to IVF   -IV mag sulfae 2g x1, add mag gluconate oral daily    9/16: Mag, K repleted. PO intakes remain very poor. Albumin  2.0. Continue maintenance fluids--will likely need TF.   9/17: Ad phosnak 1 packet x3 doses for repletion of K and Phos; repeat in AM--nephrology changed to IV--repleted 9-18  9-22: Nutrition doing much better, eating 50 to 100% of meals.  Replete electrolytes as below. 9/23: Finishing calorie count per SLP today - PO intakes MUCH better - hopeful to remove cortrak tube otherwise will get IR consult to evaluate if PEG possible 9-24: Dietary stating patient is to keep up with 50% of his energy needs, can consider removing core track tube.  Will wait for labs in a.m. to ensure nutrition and hydration continue to be adequate before removing; did just DC free water  flushes and IV fluids  8. Sepsis/Abdominal wall/mesh infection: IV Vanc/Ceftriaxone  for 6 weeks from 08/21 (10/2)              --  Probable 6 months total of antibiotics  --Corynebacterium species-->6 more weeks ?Dalbavancin for OP use, cont ceftriazone and linezolid  for now  per ID -appreciate recent ID f/u CRP nl 0.8 ESR 44 WBC 8.2 9-11: WBC labs remained stable. 9/15 wbc 7.1 9/16: Discussed with Dr. Dennise ID possibility of encephalopathy with current antibiotics, as well as +UA yesterday. Recommendation to continue current broad spectrum antibiotics as they are unlikely contributors and regimen has already been altered from vancomycin . Given some yeast in UA will await culture to see if secondary fungal infection present vs contaminant. -pending 9/17 9/18; -LA 1.9, CT abdomen and pelvis results  --see above 9-22: Per Dr Dennise,  then continue oritavancin  and ceftriaxone  till 10/2.  After which can do 6 more weeks on omdada+dalvance/tedezolid.  Continue Rocephin .-- will double check with ID on this timeline 9/23 9-24: Infectious disease, pharmacy working with family today on discharge antibiotic regimen from a cost intolerance perspective; I agree with trialing his home antibiotic regimen in the hospital to ensure that nausea does not limit him again  9. Left> right hydronephrosis: Nephrostomy tubes with stent exchanged on 08/27 --continue clamping right tube and left to drain per urology.  Monitor for blood recheck CBC in am --hemoglobin stable, no further bloody output appreciated -9/15 flush left nephrostomy tube 9-18: Prior right tube site continues to drain, CT abdomen and pelvis as above  10. Acute on chronic renal failure/hyponatremia:  elevated CR- baseline creat probably ~1.4  -9/10 will give addnl IVF today d/t poor po intake   -recheck bmet in am  - 9-11: AKI significantly worse, now with hyponatremia 131.  Will give 1 L fluid bolus normal saline today, recheck BMP; given brisk uptrend despite increase in IV fluids will consult nephrology for further recommendations.  - 9-12: Appreciate nephrology input and  recommendations.  Responding slightly to increased IV fluids overnight, normal saline 125/h.  Hyponatremia remains 131. On multiple medications that could be contributing including Florinef , sertraline , and linezolid .  See their note for specific recommendations; advised to remain on fluids for primary treatment of hyponatremia, do not recommend salt tabs at this time.  - If renal function does not improve with fluids may need CT scan of the abdomen and pelvis to verify patency of nephrostomy/ileal conduit drainage   9-13: Potassium chloride  packet 40 mill equivalents ordered this a.m. for repletion.  Sodium improving, bicarb improving, but renal function remains unchanged on bicarb drip.  9-14: Renal function improved, acidosis resolved, sodium 134.  Per nephrology, can DC fluids, but agreeable to maintenance fluids given ongoing poor p.o. intakes.  Continue to monitor closely.  9/15-17 Cr continues to improve gradually, continue IVF, push po  9/18: On TF, DC IVF, move to FWF 200 ccs QID--monitor closely  9-19: Seems to be tolerating free water  flushes well, continuing to improve.  Continue through this weekend.     9/21 creatinine down to 1.29/BUN 40 continue current regimen and monitor  9-22: BUN 35, creatinine 1.19.  9/23: ok to check renal function 2x weekly, now GFR >60. DC FWF, encourage PO. --Labs in a.m.  11. OSA: Encourage CPAP use 12. Hypotension/tachycardia/?adrenal insufficiency: Continue Midodrine  15 mg TID and Florinef  2 mg daily. Plasma cortisol 13.6 6/25/             --continues to have significant tachycardia with activity likely due to orthostasis and deconditioning.  - -hgb 11.5   -continue metoprolol  low dose 12.5mg  bid, bp remains soft but stable at present   -  IVF as above Per PT- has been not dropping in therapy   9-13: Orthostatic today, DC Lopressor  and increase midodrine  to 20 mg 3 times daily.  Has been getting a significant amount of IV fluid last few days, still  likely volume depleted per nephrology.  9-14: Blood pressure remains stable, but soft. 9/15 hypotensive this morning.  Continue IVF and plan as above 9/16: A bit improved today; continue IVF, no signs of volume overload. If profoundly hypotensive again may need to transfer to acute given maximizing fluids/BP support without pressors.  9-18: DC IV fluid as above today; monitor blood pressures very closely, seem more stable than they were last week.  On chart review, was minimally responsive to trial of high-dose steroids in August; does not look like cosyntropin stim test was ever performed; Continue Florinef  for now, may need to consider if blood pressures do not improve 9-19: Blood pressure holding up in the low 100s, continue current regimen. 9/20-21 BP stable, continue to monitor  9/23: Stable -reduce midodrine  back to 15 mg 3 times daily--> 10 mg 3 times daily 9-24.  If tolerates well, will start reducing Florinef  next.  May need rate control agent although current tachycardia seems to be compensatory sinus tach     08/05/2024    5:08 AM 08/05/2024    1:14 AM 08/04/2024    8:11 PM  Vitals with BMI  Weight  231 lbs 8 oz   BMI  32.3   Systolic 121  134  Diastolic 69  81  Pulse 110  105     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  -- resolved  9/1 held reglan   -9/16: resume reglan  given low OP, nausea--with benefit, output remains low   - 9/18: Ostomy output picking up considerably with tube feeds; on fibercon BID; appreciate nutrition input--staying appropriate   - 9/23: Ostomy OP appropriate  14. Depression: On Zoloft -- Dced 9/13 due to hyponatremia and confusion   - 9/16: Family concerned for serotonin syndrome with zoloft +linezolid - no tachycardia, fevers, seizures, or severe agitation reported - encephalopathy has not improved with removal of zoloft . Low suspicion at this time.   15. Malnutrition/: Albumin  @1 .9/TP-5.1.  --> 2.2 9/11;  - 9-11: Adding Premeal Zofran  as  above.  Will get nutrition to start calorie count; may need additional supplementation or discussion of feeding tube if does not improve.  - 9-12: Slightly improved, continue this regimen.  9-13: Very poor p.o. intakes yesterday.  DC Megace  due to possible cognitive effects, no perceived benefit.  Will discuss possible feeding tube with family--patient rejects, will get calorie count for 48 hours and if no improvement may readdress tube feed versus palliative consult  9-14: Wife encouraging p.o.'s, patient motivated to avoid feeding tube.  Calorie count started.  Maintenance fluids as above.  9/16: Qtc 519; DC zofran , resume relgan 5 mg TID AC as he did well with this in the past and ostomy output has slowed considerably  9-17: QTc remains prolonged; will not resume Zofran  at this time.  Patient agreeable to placement of core track and discussed with nutrition starting with nocturnal tube feeds for supplementation, to allow appetite during the day to continue to work on increasing p.o.'s.  Will give one-time Xanax  to assist in anxiety with core track placement.  9-18: Tolerating tube feeds well, labs looking stable.  P.o. intakes poor this a.m. due to timing of therapy/medication/breakfast, continue to encourage as primary form of nutrition.  9-19: Tolerating overnight tube feeds  well, p.o. intakes during the day complicated by gagging/coughing due to tube feed placement.  Chest x-ray yesterday was negative.  Orders to maintain head of bed 30 degrees whether or not tube feeds are running, and viscous lidocaine  4 times daily as needed for sensation of gagging.  9/20 nausea noted today, patient did get  dose of Compazine  and is continued on Reglan .  Will check with pharmacy to see if any new medications could be contributing.Appears he was transition to nocturnal tube feeds may be rate a little high for him will decrease to 60 mL/h overnight.  Discussed with pharmacy he got a one-time dose of Oritivancin  yesterday, he should not receive any more of this as this was a one-time order  -9-22- nausea improved with current regimen--helping considerably with p.o. intakes, may be able to DC tube feeds this week.  Dietary resumed calorie count today.  9/23: Nausea resolved; pending stable PO intakes may DC reglan  this week   16. Anemia of chronic illness: Stable overall 9/8-11  - Hemoglobin stable 9-10  9-22:Hemoglobin from 9.7->8.6   No obvious sources of bleeding, repeat in AM.  9/23: HgB stable 8.3--labs in a.m.       Latest Ref Rng & Units 08/04/2024    4:54 AM 08/03/2024    3:51 AM 08/02/2024    5:46 AM  CBC  WBC 4.0 - 10.5 K/uL 6.0  6.4  8.4   Hemoglobin 13.0 - 17.0 g/dL 8.3  8.6  9.7   Hematocrit 39.0 - 52.0 % 25.9  27.8  30.5   Platelets 150 - 400 K/uL 72  71  82     17.  Thrombocytopenia.  Mild, 136 today.  Monitor very closely in setting of linezolid .   - 9/19: 104; per Dr. Dennise likely adjusting linezolid  to orvitatancin starting Monday.  Continue to trend  9-22: Remains downtrending, trend.  71 today.  May take a few days to bounce back from linezolid   9/23: PLT stabilized 72; monitor  Labs in a.m., would consider this reaction if long-term medication plan involves linezolid   18.  Hypophosphatemia.  1.3 to 3.1 with IV repletion, next day 1.6.  - 9-19: Per electrolyte protocol, start Phos-Nak Powder via tube feed 2 packets 4 times daily for 5 doses  - Repeat daily.  -9/20 replete today, 15mmol IV,  discussed dose pharmacy  -9/21 improved to 1.9, continue replete per pharm rec   -22: 2.3, replete today, check in a.m.--repleted  19. Low MG  -9/20 replete today 2g IV,  discussed dose pharmacy  -9/21 improved today continue to monitor  9-22: Recheck in AM.--repleted   LOS: 25 days A FACE TO FACE EVALUATION WAS PERFORMED  Kristopher Thompson 08/05/2024, 8:30 AM

## 2024-08-06 LAB — CBC
HCT: 25.6 % — ABNORMAL LOW (ref 39.0–52.0)
Hemoglobin: 8 g/dL — ABNORMAL LOW (ref 13.0–17.0)
MCH: 26.2 pg (ref 26.0–34.0)
MCHC: 31.3 g/dL (ref 30.0–36.0)
MCV: 83.9 fL (ref 80.0–100.0)
Platelets: 132 K/uL — ABNORMAL LOW (ref 150–400)
RBC: 3.05 MIL/uL — ABNORMAL LOW (ref 4.22–5.81)
RDW: 14.7 % (ref 11.5–15.5)
WBC: 7 K/uL (ref 4.0–10.5)
nRBC: 0 % (ref 0.0–0.2)

## 2024-08-06 LAB — GLUCOSE, CAPILLARY
Glucose-Capillary: 90 mg/dL (ref 70–99)
Glucose-Capillary: 111 mg/dL — ABNORMAL HIGH (ref 70–99)
Glucose-Capillary: 123 mg/dL — ABNORMAL HIGH (ref 70–99)
Glucose-Capillary: 105 mg/dL — ABNORMAL HIGH (ref 70–99)
Glucose-Capillary: 101 mg/dL — ABNORMAL HIGH (ref 70–99)

## 2024-08-06 LAB — BASIC METABOLIC PANEL WITH GFR
Anion gap: 10 (ref 5–15)
BUN: 31 mg/dL — ABNORMAL HIGH (ref 8–23)
CO2: 19 mmol/L — ABNORMAL LOW (ref 22–32)
Calcium: 8.5 mg/dL — ABNORMAL LOW (ref 8.9–10.3)
Chloride: 107 mmol/L (ref 98–111)
Creatinine, Ser: 1.17 mg/dL (ref 0.61–1.24)
GFR, Estimated: >60 mL/min (ref >60)
Glucose, Bld: 96 mg/dL (ref 70–99)
Potassium: 3.8 mmol/L (ref 3.5–5.1)
Sodium: 136 mmol/L (ref 135–145)

## 2024-08-06 LAB — PREALBUMIN: Prealbumin: 20 mg/dL (ref 18–38)

## 2024-08-06 LAB — MAGNESIUM: Magnesium: 1.5 mg/dL — ABNORMAL LOW (ref 1.7–2.4)

## 2024-08-06 LAB — HEPATIC FUNCTION PANEL
ALT: 38 U/L (ref 0–44)
AST: 33 U/L (ref 15–41)
Albumin: 2 g/dL — ABNORMAL LOW (ref 3.5–5.0)
Alkaline Phosphatase: 77 U/L (ref 38–126)
Bilirubin, Direct: <0.1 mg/dL (ref 0.0–0.2)
Total Bilirubin: 0.4 mg/dL (ref 0.0–1.2)
Total Protein: 4.8 g/dL — ABNORMAL LOW (ref 6.5–8.1)

## 2024-08-06 LAB — PHOSPHORUS: Phosphorus: 2.8 mg/dL (ref 2.5–4.6)

## 2024-08-06 MED ORDER — FLUDROCORTISONE ACETATE 0.1 MG PO TABS
0.1000 mg | ORAL_TABLET | Freq: Every day | ORAL | Status: DC
Start: 2024-08-07 — End: 2024-08-09
  Administered 2024-08-07 – 2024-08-09 (×3): 0.1 mg via ORAL
  Filled 2024-08-06 (×3): qty 1

## 2024-08-06 MED ORDER — SODIUM CHLORIDE 0.9 % IV SOLN: INTRAVENOUS | Status: AC

## 2024-08-06 MED ORDER — PROPRANOLOL HCL 20 MG PO TABS: 10.0000 mg | ORAL_TABLET | Freq: Two times a day (BID) | ORAL | Status: DC

## 2024-08-06 MED ORDER — MAGNESIUM SULFATE 4 GM/100ML IV SOLN
4.0000 g | Freq: Once | INTRAVENOUS | Status: AC
  Administered 2024-08-06: 4 g via INTRAVENOUS
  Filled 2024-08-06: qty 100

## 2024-08-06 NOTE — Progress Notes (Signed)
 Physical Therapy Session Note  Patient Details  Name: Kristopher Thompson MRN: [id number] Date of Birth: Aug 30, 1951  Today's Date: 08/06/2024 PT Individual Time: 0805-0845 PT Individual Time Calculation (min): 40 min   Today's Date: 08/06/2024 PT Individual Time: 1105-1200 PT Individual Time Calculation (min): 55 min   Short Term Goals: Week 3:  PT Short Term Goal 1 (Week 3): STGs = LTGs  Skilled Therapeutic Interventions/Progress Updates:     1st Session: Pt received seated at EOB and agrees to therapy. No complaint of pain. Pt performs sit to stand from elevated bed with cues for hand placement, and performs stand step transfer to [facility] with cues for increased eccentric control of stand to sit for safety and strengthening. WC transport to gym. Pt performs sit to stand in parallel bars with light minA to promote anterior weight shift, with cues for body mechanics and sequencing. Pt performs standing marches with cues to put as little weight as possible through upper extremities to focus loading through legs and challenge balance as well. Pt verbalizes feeling weaker than normal. PT encourages fluid intake as pt's face appears paler than normal. Vitals assessed and BP is 108/75 with HR of 122 bpm and Oxygen saturation of 100% on room air. . Pt completes x3 additional reps of sit to stand with light minA and tasked with standing with upright posture and minimal upper extremity support. Left seated in WC with all needs within reach   2nd Session: Pt received seated in tilt in space WC and agrees to therapy. Wife concerned about drainage coming from open site on pt's Rt flank. RN notified and changes abd pad at site of drainage. Pt performs stand step transfer from [facility] to mat table with minA and cues for positioning and sequencing. Pt remains seated on mat in short sitting while nurse changes dressing, working on sitting balance and endurance. Pt reports feeling better than this morning. Pt performs sit  to stand from elevated mat with light minA for manual facilitation of anterior weight shift, then ambulates x25', performing 180 degree turn around cone and returning to mat. Pt completes x2 more bouts of x25' ambulation with exended seated rest breaks in between. PT cues for pursed lip breathing to optimize oxygen sats and slow HR. Pt completes x2 final bouts of ambulation with similar assistance and cues. Left seated in WC with all needs within reach.   Therapy Documentation Precautions:  Precautions Precautions: Fall (HOH, Urostomy, Colostomy, PCN  drain, and orthostatic hypotension.) Recall of Precautions/Restrictions: Impaired Precaution/Restrictions Comments: urostomy, colostomy, lt PCN drain, watch BP and HR Restrictions Weight Bearing Restrictions Per Provider Order: No   Therapy/Group: Individual Therapy  Elsie JAYSON Dawn, PT, DPT 08/06/2024, 3:50 PM

## 2024-08-06 NOTE — Progress Notes (Signed)
 Speech Language Pathology Daily Session Note  Patient Details  Name: ANVAY TENNIS MRN: [id number] Date of Birth: Nov 11, 1951  Today's Date: 08/06/2024 SLP Individual Time: 1430-1530 SLP Individual Time Calculation (min): 60 min  Short Term Goals: Week 4: SLP Short Term Goal 1 (Week 4): STGs= LTGs due to ELOS  Skilled Therapeutic Interventions:   Pt and wife greeted at bedside for tx tasks targeting cognition. He reported improved swallowing as of late, only noting one instance w/ cheerios this AM. He also endorsed that he was talking during this, which negatively impacted success. He was provided w/ written tasks x2 (money management via bills/change calc and calendar organization) and required supervisionA for attention to detail. He completed a written directions task @ modI, demonstrating adequate attention and information processing. At the end of tx tasks, he was left in his chair w/ the alarm set and call light within reach. Recommned cont ST per POC.   Pain Pain Assessment Pain Scale: 0-10 Pain Score: 0-No pain  Therapy/Group: Individual Therapy  [name] 08/06/2024, 5:01 PM

## 2024-08-06 NOTE — Progress Notes (Signed)
 Encouraged to drink fluids today. Explained about can lose more fluid output with the urostomy bag due to not having with a bladder voluntary control to retain fluids from the kidneys. Encouraged to drink 1500 to 2000 ml of fluid daily. Spouse of partner does endorse has been encouraging patient to take sips of water  throughout the day today.

## 2024-08-06 NOTE — Progress Notes (Signed)
 Patient ID: Kristopher Thompson, male   DOB: Nov 08, 1951, 73 y.o.   MRN: [id number]  Dr. Merchant is noted to be discharged soon.  I am working on arranging f/u in IR and our office for October.

## 2024-08-06 NOTE — Progress Notes (Signed)
 Occupational Therapy Session Note  Patient Details  Name: Kristopher Thompson MRN: [id number] Date of Birth: [birth year]  Today's Date: 08/06/2024 OT Individual Time: 1005-1045 OT Individual Time Calculation (min): 40 min    Short Term Goals: Week 4:  OT Short Term Goal 1 (Week 4): Pt will complete sit > stand with min A using LRAD OT Short Term Goal 2 (Week 4): Pt will complete stand pivots with min A OT Short Term Goal 3 (Week 4): Pt will tolerate standing for 1 min to increase activity tolerance  Skilled Therapeutic Interventions/Progress Updates:   Pt greeted sitting in TIS WC, spouse at bed-side completing colostomy bag care. Spouse inquiring about using a strap to hold urostomy bag in placed, MD made aware. Pt dependent for WC transport from room<>day room. In day room, pt performs sit<>stands with CGA-Min A, HR elevating into 140s, returning to lows 120s with EXTENDED rest-breaks. Pt performs stand-step transfers from WC<>EOM with CGA + RW. OT adjusts ROHO cushion as it was deflated. Pt remained sitting in TIS WC, spouse at bedside. Time dedicated to communicating vitals to treatment team.    Therapy Documentation Precautions:  Precautions Precautions: Fall (HOH, Urostomy, Colostomy, PCN  drain, and orthostatic hypotension.) Recall of Precautions/Restrictions: Impaired Precaution/Restrictions Comments: urostomy, colostomy, lt PCN drain, watch BP and HR Restrictions Weight Bearing Restrictions Per Provider Order: No   Therapy/Group: Individual Therapy  [name], OTR/L, MSOT  08/06/2024, 7:52 AM

## 2024-08-06 NOTE — Progress Notes (Signed)
 PROGRESS NOTE   Subjective/Complaints:  Vitals remained stable overnight, Tmax 99.1.  Blood pressure staying 120s to 110s.  tachycardic again with therapies today, up into the 150s with activity.  resolved quickly at rest, asymptomatic.  A.m. labs significant for continuing to improve BUN and creatinine, magnesium  1.5, albumin  improved to 2.0, hemoglobin slowly downtrending to 8.0 today.  Platelets are starting to improve back at 132 today.  Very little stool output documented the last couple of days--wife has been changing, says it has been adequate.  Switched from ceftriaxone  to omadacycline  yesterday--however, per ID discussion with Dr. Sheldon today, do not think that mesh is infected and will trial off of antibiotics for short time.  ID discussed with patient this a.m., who is agreeable.  ROS: Patient denies fever, rash, sore throat, blurred vision, dizziness,  , vomiting, diarrhea, cough, shortness of breath or chest pain, joint or back/neck pain, headache, or mood change.     Objective:   DG CHEST PORT 1 VIEW Result Date: 08/04/2024 CLINICAL DATA:  Edema EXAM: PORTABLE CHEST 1 VIEW COMPARISON:  July 30, 2024 FINDINGS: Right PICC tip terminates in cavoatrial junction. Enteric tube tip terminates within the gastric lumen. Cholecystectomy clips in right upper quadrant. The heart size and mediastinal contours are within normal limits. Both lungs are clear. The visualized skeletal structures are unremarkable. IMPRESSION: No active disease. Supportive tubings as detailed above. Electronically Signed   By: Megan  Zare M.D.   On: 08/04/2024 19:35      Recent Labs    08/04/24 0454 08/06/24 0033  WBC 6.0 7.0  HGB 8.3* 8.0*  HCT 25.9* 25.6*  PLT 72* 132*    Recent Labs    08/06/24 0033  NA 136  K 3.8  CL 107  CO2 19*  GLUCOSE 96  BUN 31*  CREATININE 1.17  CALCIUM  8.5*    Intake/Output Summary (Last 24 hours) at  08/06/2024 1024 Last data filed at 08/06/2024 9062 Gross per 24 hour  Intake 450 ml  Output --  Net 450 ml        Physical Exam: Vital Signs Blood pressure 110/63, pulse 91, temperature 98.7 F (37.1 C), resp. rate 19, height 5' 11 (1.803 m), weight 105 kg, SpO2 100%.   Constitutional: No acute distress.  Sitting up in bedside chair. HEENT: NCAT, EOMI, oral membranes moist.  Core track in place. Neck: supple, no JVD. Cardiovascular: RRR without murmur.  1+ bilateral upper extremity and lower extremity edema. Respiratory/Chest: CTA Bilaterally without wheezes or rales. Normal efforrt.  GI/Abdomen: soft, non-tender, non-distended.  Positive bowel sounds ostomy with liquid brown stool--mild amount. Ext: no clubbing, cyanosis. Psych: Flat, but cooperative.  Increasingly dynamic from prior exams. Skin:  abdominal wound  with pink with granulation tissue, moist, covered in clean dressing. --Unchanged Left nephrostomy tube with bag seal in place. --Unchanged R flank prior tube site continues to drain-serosanguineous appearance today, somewhat increased drainage but no obvious thick or white/green discharge.-25 Urostomy with some sediment, mostly clear output. --Unchanged Ileostomy well-circumscribed, underlying pink healthy tissue  Neuro:   AAO.  [name], hospital, and time. Attention much improved from last week, can answer complex questions with mild  deficits.  Complicated by poor hearing.  Normal language and speech.  Cranial nerve exam unremarkable.  MMT: BUE 4/5 prox to distal. BLE 4-/5 HF, 4/5 KE and 4/5 ADF/PF- No limb ataxia or cerebellar signs.  Bilateral upper and lower extremity intention tremors--none apparent today no abnormal tone appreciated.   Musculoskeletal: Able to straighten both knees out on bed.  Plantarflexion tone bilateral lower extremities, barely able to range to neutral - unchanged   Assessment/Plan: 1. Functional deficits which require 3+  hours per day of interdisciplinary therapy in a comprehensive inpatient rehab setting. Physiatrist is providing close team supervision and 24 hour management of active medical problems listed below. Physiatrist and rehab team continue to assess barriers to discharge/monitor patient progress toward functional and medical goals  Care Tool:  Bathing    Body parts bathed by patient: Chest, Face, Abdomen, Right arm, Left arm, Front perineal area, Right upper leg, Left upper leg   Body parts bathed by helper: Buttocks, Right lower leg, Left lower leg     Bathing assist Assist Level: Moderate Assistance - Patient 50 - 74%     Upper Body Dressing/Undressing Upper body dressing   What is the patient wearing?: Hospital gown only    Upper body assist Assist Level: Moderate Assistance - Patient 50 - 74%    Lower Body Dressing/Undressing Lower body dressing      What is the patient wearing?: Pants     Lower body assist Assist for lower body dressing: Maximal Assistance - Patient 25 - 49%     Toileting Toileting Toileting Activity did not occur (Clothing management and hygiene only): N/A (no void or bm)  Toileting assist Assist for toileting: Dependent - Patient 0%     Transfers Chair/bed transfer  Transfers assist  Chair/bed transfer activity did not occur: Safety/medical concerns  Chair/bed transfer assist level: Moderate Assistance - Patient 50 - 74%     Locomotion Ambulation   Ambulation assist   Ambulation activity did not occur: Safety/medical concerns (fatigue/dizziness/orthostatics)          Walk 10 feet activity   Assist  Walk 10 feet activity did not occur: Safety/medical concerns (fatigue/dizziness/orthostatics)        Walk 50 feet activity   Assist Walk 50 feet with 2 turns activity did not occur: Safety/medical concerns (fatigue/dizziness/orthostatics)         Walk 150 feet activity   Assist Walk 150 feet activity did not occur:  Safety/medical concerns (fatigue/dizziness/orthostatics)         Walk 10 feet on uneven surface  activity   Assist Walk 10 feet on uneven surfaces activity did not occur: Safety/medical concerns (fatigue/dizziness/orthostatics)         Wheelchair     Assist Is the patient using a wheelchair?: Yes Type of Wheelchair: Manual           Wheelchair 50 feet with 2 turns activity    Assist    Wheelchair 50 feet with 2 turns activity did not occur: Safety/medical concerns       Wheelchair 150 feet activity     Assist  Wheelchair 150 feet activity did not occur: Safety/medical concerns       Blood pressure 110/63, pulse 91, temperature 98.7 F (37.1 C), resp. rate 19, height 5' 11 (1.803 m), weight 105 kg, SpO2 100%. Medical Problem List and Plan: 1. 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  Undergoing medical workups as below, will monitor improvement for the next couple of days --address at teams if current level of care exceeds ability of home caregivers.  - 9-13: Bilateral lower extremity PRAFO's in bed for plantarflexion/tight heel cords  --Continue CIR therapies including PT, OT, and SLP. Progress has been very slow. May need to consider other discharge venue   - 9/16: Declining with therapies; pleasantly confused today, interactions much less withdrawn but nonsensical. Will need Mod-Max A at home. Concern for physical decline exceeding family ability for caregiving if no improvements moving forward.   - 9-17 waiting on calorie counts today to determine if feeding tube needed; if patient/family continue to refuse, would recommend goals of care discussion and possible palliative care  consult--patient opted for placement of tube feed  - 9-18: Due to ongoing significant support needed to maintain blood pressure, as well as chills today, discussed additional workup/treatment with infectious disease; recommend against treatment of yeast at this time given likely contamination, given ongoing decline will get lactic acid and repeat CT abdomen and pelvis without contrast in a.m--lactic acid upper limit of normal 1.9, CT abdomen and pelvis results -right ventral pelvic wall fluid collection not changed in size from prior study, distal colonic diverticulosis, small bilateral pleural effusions and dependent lower lobe atelectasis, IR consulted yesterday but holding off on drainage of fluid collection  - . Repeat imaging performed yesterday and shows stable fluid collection in the abdominal wall. Patient without elevated WBC, remains afebrile, collection appears similar in size and nature. Per Dr. Jenna, no aspiration or drainage planned for now.    -  9/23: Wife continues to do all cares - doing walking trials with OT to work on endurance and transfers, remains Mod-Max A to stand but once on his feet can be Min A 46 feet. PT Mod-Max A sit to stand. PO intakes much improved last 2 days.  Likely stedy; has home hospital bed without power lift so will check height for transfers.   - Family meeting today: Est Min-Mod A at home, Wife to work with patient closely over the next week to determine if able to handle needs at home.     2.  H/o PE/Antithrombotics: -DVT/anticoagulation:  Pharmaceutical: Eliquis              -antiplatelet therapy: N/A  - No further blood appreciated in nephrostomy tubes  3. Pain Management: Oxycodone  prn.    - 9/16: Oxycodone  was removed yesterday due to concerns of this contributing to PM confusion and hypotension; nursing reports family in disagreement with this and patient pain increased. Adjust to Tylenol  PRN for mild to moderate, Percocet 5 mg for severe pain to  avoid undertreatment. Monitor BP VERY closely  9-18: Has not required Percocet again since 9-15  9/21 patient reports pain is overall under control  4. Mood/Behavior/Sleep: LCSW to follow for evaluation and support.              --melatonin for insomnia.              -antipsychotic agents: N/A  9-14: Per patient, sleeping well.  Sleep log.  Discussed delirium management with patient's wife  - 9-18 sleep log remains appropriate, no apparent hallucinations or delusions.  Continue current management  5. Neuropsych/cognition/encephalopathy likely secondary to metabolic acidosis vs delirium from prolonged illness/hospitalization: This patient is not capable of making decisions on his own behalf.  - 9-11: Increasingly withdrawn, behavioral changes may be secondary to poor nutritional intake and hyponatremia.  Treating as below, on sertraline  100 mg daily, may need to add additional agent such as mirtazapine but hesitant given ongoing cognitive issues.  9-12: Slightly better today, but consistent with hypoactive delirium with primary difficulty with concentration, no focal neurologic deficits.  Start overnight sleep log, delirium precautions.  Suspect hyponatremia as primary contributor, management as below.  9/13: D3C Flexeril , Megace , Zoloft  due to ongoing cognitive changes.  Sodium is improving.  Get LFTs, ammonia today to evaluate ongoing cognitive deficits--within normal limits.  -14: Cognition seems to be clearing a bit, continue current regimen.   9/15 alert but delayed. Continue to maximize renal/volume/nutritional status 9/16: Much more alert, but delirious; sleep log appears appropriate. Will add Zyprexa  5 mg PRN for aggitation d/t increased aggitation yesterday afternoon. Keep low dose / low likelihood of Qtc prolongation medications only.   9-18: Cognition improving very gradually, QTc remains prolonged.  DC zyprexa  due to QTC   9-22: Doing much better this week.  Continue with adequate  nutrition, hydration    6. Skin/Wound Care: Routine pressure relief measures. Encourage adequate diet             --dressing changes daily. Gerhardt's cream to buttock wound.   -abdominal wound continues to close despite tenous nutritional status  7. Fluids/Electrolytes/Nutrition/oropharyngeal dysphagia/moderate to severe protein calorie malnutrition:                --9/10 periactin  yesterday stopped d/t lethargy   -will try megace  beginning today   -RD f/u   -push fluids   -recheck bmet in am   -continue juven bid. Has been refusing Ensure supplement.   -kdur 20meq daily   -can't use zofran  d/t QTc prolongation (490)  - MBS   9/3--D3/thins, prealbumin borderline normal at 20   - 9-11: Nausea remains a primary barrier to adequate p.o. intakes.  Recheck EKG today, QTc was 460, scheduled Zofran  4 mg IV 3 times daily AC.  Will repeat EKG in next 1 to 2 days.  - 9-12: Albumin  infusion per nephrology.  P.o. intakes improving with Zofran .  per IV team midline contraindicated due to bicarb.  May get PICC line once dialysis no longer considered.  9-14: P.o. intakes okay, Zofran  continues to help with nausea.  Per nephrology, okay to reduce fluids, will keep on 70 cc/h normal saline for maintenance for now.  PICC line order placed.  9/15 intake 20-30%. Receiving mtc IVF.    -k+ 3.3, mg++ 1.3   -continue kdur 20meq daily and add 40meq to IVF   -IV mag sulfae 2g x1, add mag gluconate oral daily    9/16: Mag, K repleted. PO intakes remain very poor. Albumin  2.0. Continue maintenance fluids--will likely need TF.   9/17: Ad phosnak 1 packet x3 doses for repletion of K and Phos; repeat in AM--nephrology changed to IV--repleted 9-18  9-22: Nutrition doing much better, eating 50 to 100% of meals.  Replete electrolytes as below. 9/23: Finishing calorie count per SLP today - PO intakes MUCH better - hopeful to remove cortrak tube otherwise will get IR consult to evaluate if PEG possible 9-24: Dietary  stating patient is to keep up with 50% of his energy needs, can consider removing core track tube.  Will  wait for labs in a.m. to ensure nutrition and hydration continue to be adequate before removing; did just DC free water  flushes and IV fluids 9-25: Core track DC'd, patient labs look okay.  Really encourage p.o. fluids, resuming IV maintenance as below due to tachycardia but should really push p.o. intakes this week.  8. Sepsis/Abdominal wall/mesh infection: IV Vanc/Ceftriaxone  for 6 weeks from 08/21 (10/2)             --Probable 6 months total of antibiotics  --Corynebacterium species-->6 more weeks ?Dalbavancin for OP use, cont ceftriazone and linezolid  for now  per ID -appreciate recent ID f/u CRP nl 0.8 ESR 44 WBC 8.2 9-11: WBC labs remained stable. 9/15 wbc 7.1 9/16: Discussed with Dr. Dennise ID possibility of encephalopathy with current antibiotics, as well as +UA yesterday. Recommendation to continue current broad spectrum antibiotics as they are unlikely contributors and regimen has already been altered from vancomycin . Given some yeast in UA will await culture to see if secondary fungal infection present vs contaminant. -pending 9/17 9/18; -LA 1.9, CT abdomen and pelvis results  --see above 9-22: Per Dr Dennise,  then continue oritavancin  and ceftriaxone  till 10/2.  After which can do 6 more weeks on omdada+dalvance/tedezolid.  Continue Rocephin .-- will double check with ID on this timeline 9/23 9-24: Infectious disease, pharmacy working with family today on discharge antibiotic regimen from a cost intolerance perspective; I agree with trialing his home antibiotic regimen in the hospital to ensure that nausea does not limit him again 9-25: Extensive discussions with infectious disease Dr. Overton and Dr. Sheldon today; feel mesh infection is less likely, decision to remove antibiotics and monitor closely over the next week.  Patient will get CT abdomen close to discharge to reevaluate size of  abscess.  CRPs every 72 hours and other labs for monitoring per ID.  Patient and family understanding and agreeable with this.  9. Left> right hydronephrosis: Nephrostomy tubes with stent exchanged on 08/27 --continue clamping right tube and left to drain per urology.  Monitor for blood recheck CBC in am --hemoglobin stable, no further bloody output appreciated -9/15 flush left nephrostomy tube 9-18: Prior right tube site continues to drain, CT abdomen and pelvis as above  10. Acute on chronic renal failure/hyponatremia:  elevated CR- baseline creat probably ~1.4  -9/10 will give addnl IVF today d/t poor po intake   -recheck bmet in am  - 9-11: AKI significantly worse, now with hyponatremia 131.  Will give 1 L fluid bolus normal saline today, recheck BMP; given brisk uptrend despite increase in IV fluids will consult nephrology for further recommendations.  - 9-12: Appreciate nephrology input and recommendations.  Responding slightly to increased IV fluids overnight, normal saline 125/h.  Hyponatremia remains 131. On multiple medications that could be contributing including Florinef , sertraline , and linezolid .  See their note for specific recommendations; advised to remain on fluids for primary treatment of hyponatremia, do not recommend salt tabs at this time.  - If renal function does not improve with fluids may need CT scan of the abdomen and pelvis to verify patency of nephrostomy/ileal conduit drainage   9-13: Potassium chloride  packet 40 mill equivalents ordered this a.m. for repletion.  Sodium improving, bicarb improving, but renal function remains unchanged on bicarb drip.  9-14: Renal function improved, acidosis resolved, sodium 134.  Per nephrology, can DC fluids, but agreeable to maintenance fluids given ongoing poor p.o. intakes.  Continue to monitor closely.  9/15-17 Cr continues to improve gradually, continue IVF,  push po  9/18: On TF, DC IVF, move to FWF 200 ccs QID--monitor  closely  9-19: Seems to be tolerating free water  flushes well, continuing to improve.  Continue through this weekend.     9/21 creatinine down to 1.29/BUN 40 continue current regimen and monitor  9-22: BUN 35, creatinine 1.19.  9/23: ok to check renal function 2x weekly, now GFR >60. DC FWF, encourage PO. --Labs in a.m.  9-25: Renal function stable, however resuming some IV fluids as below.  11. OSA: Encourage CPAP use 12. Hypotension/tachycardia/?adrenal insufficiency: Continue Midodrine  15 mg TID and Florinef  2 mg daily. Plasma cortisol 13.6 6/25/             --continues to have significant tachycardia with activity likely due to orthostasis and deconditioning.  - -hgb 11.5   -continue metoprolol  low dose 12.5mg  bid, bp remains soft but stable at present   -  IVF as above Per PT- has been not dropping in therapy   9-13: Orthostatic today, DC Lopressor  and increase midodrine  to 20 mg 3 times daily.  Has been getting a significant amount of IV fluid last few days, still likely volume depleted per nephrology.  9-14: Blood pressure remains stable, but soft. 9/15 hypotensive this morning.  Continue IVF and plan as above 9/16: A bit improved today; continue IVF, no signs of volume overload. If profoundly hypotensive again may need to transfer to acute given maximizing fluids/BP support without pressors.  9-18: DC IV fluid as above today; monitor blood pressures very closely, seem more stable than they were last week.  On chart review, was minimally responsive to trial of high-dose steroids in August; does not look like cosyntropin stim test was ever performed; Continue Florinef  for now, may need to consider if blood pressures do not improve 9-19: Blood pressure holding up in the low 100s, continue current regimen. 9/20-21 BP stable, continue to monitor  9/23: Stable -reduce midodrine  back to 15 mg 3 times daily--> 10 mg 3 times daily 9-24.  If tolerates well, will start reducing Florinef  next.   May need rate control agent although current tachycardia seems to be compensatory sinus tach 9-24: Continues to get tachycardic into the 150s with exertion, comes down to 90s to 110s with rest.  Discussed with cardiology, appreciate recommendations, clearly compensatory and should not treat.  Given poor p.o. fluid intakes, will resume IV normal saline at 70 cc/h for maintenance and really encourage p.o. fluids     08/06/2024    4:49 AM 08/05/2024    8:51 PM 08/05/2024    3:23 PM  Vitals with BMI  Systolic 110 123 870  Diastolic 63 73 84  Pulse 91 97 100     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  -- resolved  9/1 held reglan   -9/16: resume reglan  given low OP, nausea--with benefit, output remains low   - 9/18: Ostomy output picking up considerably with tube feeds; on fibercon BID; appreciate nutrition input--staying appropriate   - 9/23: Ostomy OP appropriate  14. Depression: On Zoloft -- Dced 9/13 due to hyponatremia and confusion   - 9/16: Family concerned for serotonin syndrome with zoloft +linezolid - no tachycardia, fevers, seizures, or severe agitation reported - encephalopathy has not improved with removal of zoloft . Low suspicion at this time.   15. Malnutrition/: Albumin  @1 .9/TP-5.1.  --> 2.2 9/11;  - 9-11: Adding Premeal Zofran  as above.  Will get nutrition to start calorie count; may need additional supplementation or discussion of feeding tube if  does not improve.  - 9-12: Slightly improved, continue this regimen.  9-13: Very poor p.o. intakes yesterday.  DC Megace  due to possible cognitive effects, no perceived benefit.  Will discuss possible feeding tube with family--patient rejects, will get calorie count for 48 hours and if no improvement may readdress tube feed versus palliative consult  9-14: Wife encouraging p.o.'s, patient motivated to avoid feeding tube.  Calorie count started.  Maintenance fluids as above.  9/16: Qtc 519; DC zofran , resume relgan 5 mg TID AC  as he did well with this in the past and ostomy output has slowed considerably  9-17: QTc remains prolonged; will not resume Zofran  at this time.  Patient agreeable to placement of core track and discussed with nutrition starting with nocturnal tube feeds for supplementation, to allow appetite during the day to continue to work on increasing p.o.'s.  Will give one-time Xanax  to assist in anxiety with core track placement.  9-18: Tolerating tube feeds well, labs looking stable.  P.o. intakes poor this a.m. due to timing of therapy/medication/breakfast, continue to encourage as primary form of nutrition.  9-19: Tolerating overnight tube feeds well, p.o. intakes during the day complicated by gagging/coughing due to tube feed placement.  Chest x-ray yesterday was negative.  Orders to maintain head of bed 30 degrees whether or not tube feeds are running, and viscous lidocaine  4 times daily as needed for sensation of gagging.  9/20 nausea noted today, patient did get  dose of Compazine  and is continued on Reglan .  Will check with pharmacy to see if any new medications could be contributing.Appears he was transition to nocturnal tube feeds may be rate a little high for him will decrease to 60 mL/h overnight.  Discussed with pharmacy he got a one-time dose of Oritivancin yesterday, he should not receive any more of this as this was a one-time order  -9-22- nausea improved with current regimen--helping considerably with p.o. intakes, may be able to DC tube feeds this week.  Dietary resumed calorie count today.  9/23: Nausea resolved; pending stable PO intakes may DC reglan  this week   16. Anemia of chronic illness: Stable overall 9/8-11  - Hemoglobin stable 9-10  9-22:Hemoglobin from 9.7->8.6   No obvious sources of bleeding, repeat in AM.  9/23: HgB stable 8.3--> 8.0       Latest Ref Rng & Units 08/06/2024   12:33 AM 08/04/2024    4:54 AM 08/03/2024    3:51 AM  CBC  WBC 4.0 - 10.5 K/uL 7.0  6.0  6.4    Hemoglobin 13.0 - 17.0 g/dL 8.0  8.3  8.6   Hematocrit 39.0 - 52.0 % 25.6  25.9  27.8   Platelets 150 - 400 K/uL 132  72  71     17.  Thrombocytopenia.  Mild, 136 today.  Monitor very closely in setting of linezolid .   - 9/19: 104; per Dr. Dennise likely adjusting linezolid  to orvitatancin starting Monday.  Continue to trend  9-22: Remains downtrending, trend.  71 today.  May take a few days to bounce back from linezolid   9/23: PLT stabilized 72; monitor  9-25: Platelets starting to come back, 130s today.  Would be cautious to resume linezolid    18.  Hypophosphatemia.  1.3 to 3.1 with IV repletion, next day 1.6.  - 9-19: Per electrolyte protocol, start Phos-Nak Powder via tube feed 2 packets 4 times daily for 5 doses  - Repeat daily.  -9/20 replete today, 15mmol IV,  discussed dose  pharmacy  -9/21 improved to 1.9, continue replete per pharm rec   -22: 2.3, replete today, check in a.m.--repleted  19. Low MG  -9/20 replete today 2g IV,  discussed dose pharmacy  -9/21 improved today continue to monitor  9-22: Recheck in AM.--repleted  -25: 1.5.  Replete IV today   LOS: 26 days A FACE TO FACE EVALUATION WAS PERFORMED  Joesph JAYSON Likes 08/06/2024, 10:24 AM

## 2024-08-06 NOTE — Progress Notes (Signed)
 [facility]  Date of Admission:  07/11/2024    Principal Problem:   Critical illness myopathy Active Problems:   Cognitive and behavioral changes   Malnutrition of moderate degree          Assessment: 73 year old male with abdominal wall/mesh infection: #Abdominal wall abscess from prior perforation status post multiple procedures #JP drain for abdominal wall cultures growing MDRO ecoli and corynebacterium species  Procedures: 04/10/24 parastomal and incisional incarcerated abd wall hernia --> LOA; parastomal hernia repair with mesh; urostomy ileal conduit revision 6/09 perforated bowel --> drainage of right abd wall abscess and intraabd abscess; explantation of mesh; small bowel resection in continuity; placement of wound vac 6/10 delayed bowel perforation with open abd --> LOA; abd wall I&D; ileal resection; end ileostomy; abd wall parastomal/incisional hernia repair with phasix mesh (dissolvable in 1 year); fascia closure and wound vac placement 6/23 bilateral nephrostomy tube placement 6/30 bilateral nephrostomy tube exchange 7/02 right ureteral stent placement      Current Admission Cx: 8/20 blood cx negative 8/21 Jp drain from the abd wall cx - Ecoli (R amp, cefazolin , cipro , bactrim ; I amp/sulb; S ceftriaxone , piptazo), Corynebacterium species   8/21 urine cx mrsa (S tetra; R bactrim ), e faecium (V sensitive)   -------------- 08/05/24 id assessment Patient had aki with vancomycin , which was stopped on 9/5, transitioned to linezolid  but will need long term so stopped and given a dose of oritavancin  on 9/19 He is also on ceftriaxone  now  Long term plan has been periodic dalbavancin or oritavancin  for the corynebacterium, and oral omadocycline for the ecoli  Duration -- I agreed with dr Sheldon I don't know how long to treat him for; according to dr Sheldon, it takes about a year for the mesh to dissolve. Undertreatment with recurrent  infection often comes with worse outcome in terms of resistance, and surgery would be a night mare. I extrapolate my duration recommendation from prosthetic joint infection which obviously is not quite the same  We could aim for 6 months of treatment. The issue now is the expense and the wife expresses that she wants to see how patient respond to any antibiotics that he'll need to take to make sure no gi side effect that could develop when they are home   --------------- 08/06/24 id assessment Patient no complaint Dr Sheldon from surgery sent me a epic inbox message saying he doesn't believe there is a mesh infection. He desribed abd wall abscess with a sinus tract to the skin.   In this setting if not mesh infection present, I would recommend tx focused on the abd wall abscess (which the drain was removed about 3 weeks ago (ecoli/coryne on drain cx). Now the area is still draining and 9/19 ct scan showed the same fluid collection.  Patient has been on midodrine /florinef  for borderline bp for several weeks but no sepsis otherwise.   I discussed with primary team that I am not sure what to make of the discharge. If it is still infected I would say then that some other drain placement or minor I&D could help patient (in setting multiple MDR organisms present already and we need source control). But if no longer infected and just a seroma/hematoma then we should observe off antibiotics and reevaluate in 1-2 weeks for microbial process   With regard to primary prophylaxis for the mesh infection, I do not believe the benefit outweighs risk and given presence of MDR  organisms already, I think it is more risky for the patient,    This was relayed to dr Sheldon    Plan: -stop abx -monitor serial crp every 3 days -repeat ct in 1-2 weeks of the abdomen and evaluate to see if fluid collection gotten bigger or concern for ongoing abd wall abscess; will discuss with surgery/ir again if that's the  case -maintain contact isolation precaution -discussed with primary team     Microbiology:   Antibiotics: Oritavancin  9/19 Linezolid  9/6-9/19 Ceftriaxone  8/30--c  Vanc 8/30-9/6 (aki)  Cultures: See above   SUBJECTIVE: No complaint Afebrile No n/v/rash Ongoing drainage at the previous fluid collection on abd wall   Review of Systems: Review of Systems  All other systems reviewed and are negative.    Scheduled Meds:  apixaban   5 mg Oral BID   childrens multivitamin  1 tablet Oral BID   Chlorhexidine  Gluconate Cloth  6 each Topical Q12H   cyanocobalamin   500 mcg Oral Q M,W,F   [START ON 08/07/2024] fludrocortisone   0.1 mg Oral Daily   Gerhardt's butt cream  1 Application Topical BID   leptospermum manuka honey  1 Application Topical Daily   loperamide   4 mg Oral TID   magnesium  gluconate  500 mg Oral Daily   melatonin  3 mg Oral QHS   metoCLOPramide   5 mg Oral TID AC   midodrine   10 mg Oral TID WC   omega-3 acid ethyl esters  1 g Oral QODAY   polycarbophil  625 mg Oral BID   potassium chloride   10 mEq Oral Daily   rosuvastatin   10 mg Oral QPM   sodium chloride  flush  10-40 mL Intracatheter Q12H   Continuous Infusions:  sodium chloride      PRN Meds:.acetaminophen , bisacodyl , diphenhydrAMINE , guaiFENesin -dextromethorphan, hydrOXYzine , iohexol , ipratropium, lidocaine , melatonin, mouth rinse, oxyCODONE -acetaminophen , prochlorperazine  **OR** prochlorperazine  **OR** prochlorperazine , sodium chloride , sodium chloride  flush Allergies  Allergen Reactions   Demerol  [Meperidine ] Nausea And Vomiting    OBJECTIVE: Vitals:   08/06/24 0449 08/06/24 1221 08/06/24 1328 08/06/24 1418  BP: 110/63 130/83 (!) 141/84 121/80  Pulse: 91 (!) 105 (!) 115 (!) 112  Resp: 19 16 18 16   Temp: 98.7 F (37.1 C) 98.7 F (37.1 C) 98.3 F (36.8 C) 98.2 F (36.8 C)  TempSrc:   Oral Oral  SpO2: 100% 100%  100%  Weight:      Height:       Body mass index is 32.29 kg/m.  Physical  Exam Constitutional:      General: He is not in acute distress.    Appearance: He is normal weight. He is not toxic-appearing.  HENT:     Head: Normocephalic and atraumatic.     Right Ear: External ear normal.     Left Ear: External ear normal.     Nose: No congestion or rhinorrhea.     Mouth/Throat:     Mouth: Mucous membranes are moist.     Pharynx: Oropharynx is clear.  Eyes:     Extraocular Movements: Extraocular movements intact.     Conjunctiva/sclera: Conjunctivae normal.     Pupils: Pupils are equal, round, and reactive to light.  Cardiovascular:     Heart sounds: No murmur heard.    No friction rub. No gallop.  Pulmonary:     Effort: Pulmonary effort is normal.     Breath sounds: Normal breath sounds.  Musculoskeletal:        General: No swelling.     Cervical back: Normal range  of motion and neck supple.  Skin:    General: Skin is warm and dry.  Neurological:     General: No focal deficit present.     Mental Status: He is oriented to person, place, and time.  Psychiatric:        Mood and Affect: Mood normal.     Abd -- ileal conduit functioning  Lab Results Lab Results  Component Value Date   WBC 7.0 08/06/2024   HGB 8.0 (L) 08/06/2024   HCT 25.6 (L) 08/06/2024   MCV 83.9 08/06/2024   PLT 132 (L) 08/06/2024    Lab Results  Component Value Date   CREATININE 1.17 08/06/2024   BUN 31 (H) 08/06/2024   NA 136 08/06/2024   K 3.8 08/06/2024   CL 107 08/06/2024   CO2 19 (L) 08/06/2024    Lab Results  Component Value Date   ALT 38 08/06/2024   AST 33 08/06/2024   ALKPHOS 77 08/06/2024   BILITOT 0.4 08/06/2024        Kristopher ONEIDA Passer, MD [facility] 08/06/2024, 4:22 PM Evaluation of this patient requires complex antimicrobial therapy evaluation and counseling + isolation needs for disease transmission risk assessment and mitigation

## 2024-08-06 NOTE — Progress Notes (Signed)
 Per nurse  abdominal wound looks great but with the gauze removal keeps causing the area to bleed. Hydrogel added to wound care to help with adherence of the gauze with wound changing.

## 2024-08-07 MED ORDER — ALTEPLASE 2 MG IJ SOLR
2.0000 mg | Freq: Once | INTRAMUSCULAR | Status: AC
  Administered 2024-08-07: 2 mg
  Filled 2024-08-07: qty 2

## 2024-08-07 NOTE — Plan of Care (Signed)
  Problem: Consults Goal: RH GENERAL PATIENT EDUCATION Description: See Patient Education module for education specifics. Outcome: Progressing   Problem: RH BOWEL ELIMINATION Goal: RH STG MANAGE BOWEL WITH ASSISTANCE Description: STG Manage Bowel with supervision Assistance. Outcome: Progressing   Problem: RH BLADDER ELIMINATION Goal: RH STG MANAGE BLADDER WITH ASSISTANCE Description: STG Manage Bladder With supervision Assistance Outcome: Progressing   Problem: RH SKIN INTEGRITY Goal: RH STG SKIN FREE OF INFECTION/BREAKDOWN Description: Manage skin free of infection with supervision assistance Outcome: Progressing   Problem: RH SAFETY Goal: RH STG ADHERE TO SAFETY PRECAUTIONS W/ASSISTANCE/DEVICE Description: STG Adhere to Safety Precautions With Assistance/Device. Outcome: Progressing   Problem: RH PAIN MANAGEMENT Goal: RH STG PAIN MANAGED AT OR BELOW PT'S PAIN GOAL Description: <4 w/ prns Outcome: Progressing   Problem: RH KNOWLEDGE DEFICIT GENERAL Goal: RH STG INCREASE KNOWLEDGE OF SELF CARE AFTER HOSPITALIZATION Description: Manage increase knowledge of self care care after hospitalization with supervision assistance from wife using educational materials provided Outcome: Progressing

## 2024-08-07 NOTE — Progress Notes (Addendum)
 CHG wipe supplies brought to room. Wife of patient explained she would complete the CHG bath. Round shortly to see if completed.  Spouse of patient completed CHG Bath.

## 2024-08-07 NOTE — Progress Notes (Signed)
 Occupational Therapy Session Note  Patient Details  Name: Kristopher Thompson MRN: [id number] Date of Birth: 05-Oct-1951  Today's Date: 08/07/2024 OT Individual Time: 9064-8979 OT Individual Time Calculation (min): 45 min  and Today's Date: 08/07/2024 OT Missed Time: 15 Minutes Missed Time Reason: Nursing care   Short Term Goals: Week 4:  OT Short Term Goal 1 (Week 4): Pt will complete sit > stand with min A using LRAD OT Short Term Goal 2 (Week 4): Pt will complete stand pivots with min A OT Short Term Goal 3 (Week 4): Pt will tolerate standing for 1 min to increase activity tolerance  Skilled Therapeutic Interventions/Progress Updates:    Pt received supine with no c/o pain, agreeable to OT session. 15 min missed d/t nursing care- pt required more meds and dressing change d/t it being saturated on RLQ. He came to EOB with (S) using bed rails. He stood from elevated EOB with CGA using the RW. 100 ft of functional mobility to the therapy gym with close CGA using the RW. He was SOB following, HR more stable today, still tachy but only to the 130's with activity and stabilizing in the low 100's with rest breaks. Pt completed standing level reciprocal tapping activity with UE support on the RW using a 5 in cone 4x5 repetitions. Activity performed to challenge dynamic standing balance and functional activity tolerance to simulate household threshold management and reduce fall risk. Pt required CGA overall. He returned to his room following, another 50 ft of functional mobility with CGA. Carryover to generalized conditioning for ADL independence. Pt was left sitting up in the wheelchair with all needs met and call bell within reach.   Therapy Documentation Precautions:  Precautions Precautions: Fall (HOH, Urostomy, Colostomy, PCN  drain, and orthostatic hypotension.) Recall of Precautions/Restrictions: Impaired Precaution/Restrictions Comments: urostomy, colostomy, lt PCN drain, watch BP and  HR Restrictions Weight Bearing Restrictions Per Provider Order: No Therapy/Group: Individual Therapy  [name] 08/07/2024, 10:13 AM

## 2024-08-07 NOTE — Progress Notes (Signed)
 PROGRESS NOTE   Subjective/Complaints:  Temperature slightly low overnight, 97.4, other vital stable.  He says he does feel some occasional chills, but this is not significantly changed from prior.    Heart rate looking much better today.  Blood pressure holding up.  Minimal p.o. fluid intakes documented, but nursing encouraging and family says he is drinking more.  See note below for infectious disease notation from yesterday. Eating 100% of meals now, much improved.  Walked to the nurses station today!   ROS: Patient denies fever, rash, sore throat, blurred vision, dizziness,  , vomiting, diarrhea, cough, shortness of breath or chest pain, joint or back/neck pain, headache, or mood change.     Objective:   No results found.     Recent Labs    08/06/24 0033  WBC 7.0  HGB 8.0*  HCT 25.6*  PLT 132*    Recent Labs    08/06/24 0033  NA 136  K 3.8  CL 107  CO2 19*  GLUCOSE 96  BUN 31*  CREATININE 1.17  CALCIUM  8.5*    Intake/Output Summary (Last 24 hours) at 08/07/2024 1038 Last data filed at 08/07/2024 0931 Gross per 24 hour  Intake 500 ml  Output 1775 ml  Net -1275 ml        Physical Exam: Vital Signs Blood pressure 117/66, pulse 88, temperature (!) 97.5 F (36.4 C), resp. rate 16, height 5' 11 (1.803 m), weight 105 kg, SpO2 100%.   Constitutional: No acute distress.  Sitting up in bedside chair. HEENT: NCAT, EOMI, oral membranes moist.    Neck: supple, no JVD. Cardiovascular: RRR without murmur.  1+ bilateral upper extremity and lower extremity edema. Respiratory/Chest: CTA Bilaterally without wheezes or rales. Normal efforrt.  GI/Abdomen: soft, non-tender, non-distended.  Positive bowel sounds ostomy with liquid brown stool--mild amount. Ext: no clubbing, cyanosis. Psych: Appropriate mood and affect.  Now joking. Skin:  abdominal wound  with pink with granulation tissue, moist, covered in  clean dressing. --Unchanged Left nephrostomy tube with bag seal in place. --Unchanged R flank draining serosanguineous fluid, increased over the last 2 days no obvious thick or white/green discharge.-26 Urostomy with some sediment, mostly clear output. --Unchanged Ileostomy well-circumscribed, underlying pink healthy tissue  Neuro:   AAO.  [name], hospital, and time. Attention much improved from last week, can answer complex questions with mild deficits.  Complicated by poor hearing.  Normal language and speech.  Cranial nerve exam unremarkable.  MMT: BUE 4/5 prox to distal. BLE 4-/5 HF, 4/5 KE and 4/5 ADF/PF-improving No limb ataxia or cerebellar signs.  Bilateral upper and lower extremity intention tremors--none apparent today no abnormal tone appreciated.   Musculoskeletal: Able to straighten both knees out on bed.  Plantarflexion tone bilateral lower extremities, barely able to range to neutral - unchanged   Assessment/Plan: 1. Functional deficits which require 3+ hours per day of interdisciplinary therapy in a comprehensive inpatient rehab setting. Physiatrist is providing close team supervision and 24 hour management of active medical problems listed below. Physiatrist and rehab team continue to assess barriers to discharge/monitor patient progress toward functional and medical goals  Care Tool:  Bathing    Body  parts bathed by patient: Chest, Face, Abdomen, Right arm, Left arm, Front perineal area, Right upper leg, Left upper leg   Body parts bathed by helper: Buttocks, Right lower leg, Left lower leg     Bathing assist Assist Level: Moderate Assistance - Patient 50 - 74%     Upper Body Dressing/Undressing Upper body dressing   What is the patient wearing?: Hospital gown only    Upper body assist Assist Level: Moderate Assistance - Patient 50 - 74%    Lower Body Dressing/Undressing Lower body dressing      What is the patient wearing?: Pants     Lower  body assist Assist for lower body dressing: Maximal Assistance - Patient 25 - 49%     Toileting Toileting Toileting Activity did not occur (Clothing management and hygiene only): N/A (no void or bm)  Toileting assist Assist for toileting: Dependent - Patient 0%     Transfers Chair/bed transfer  Transfers assist  Chair/bed transfer activity did not occur: Safety/medical concerns  Chair/bed transfer assist level: Moderate Assistance - Patient 50 - 74%     Locomotion Ambulation   Ambulation assist   Ambulation activity did not occur: Safety/medical concerns (fatigue/dizziness/orthostatics)          Walk 10 feet activity   Assist  Walk 10 feet activity did not occur: Safety/medical concerns (fatigue/dizziness/orthostatics)        Walk 50 feet activity   Assist Walk 50 feet with 2 turns activity did not occur: Safety/medical concerns (fatigue/dizziness/orthostatics)         Walk 150 feet activity   Assist Walk 150 feet activity did not occur: Safety/medical concerns (fatigue/dizziness/orthostatics)         Walk 10 feet on uneven surface  activity   Assist Walk 10 feet on uneven surfaces activity did not occur: Safety/medical concerns (fatigue/dizziness/orthostatics)         Wheelchair     Assist Is the patient using a wheelchair?: Yes Type of Wheelchair: Manual           Wheelchair 50 feet with 2 turns activity    Assist    Wheelchair 50 feet with 2 turns activity did not occur: Safety/medical concerns       Wheelchair 150 feet activity     Assist  Wheelchair 150 feet activity did not occur: Safety/medical concerns       Blood pressure 117/66, pulse 88, temperature (!) 97.5 F (36.4 C), resp. rate 16, height 5' 11 (1.803 m), weight 105 kg, SpO2 100%. Medical Problem List and Plan: 1. 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  Undergoing medical workups as below, will monitor improvement for the next couple of days --address at teams if current level of care exceeds ability of home caregivers.  - 9-13: Bilateral lower extremity PRAFO's in bed for plantarflexion/tight heel cords  --Continue CIR therapies including PT, OT, and SLP. Progress has been very slow. May need to consider other discharge venue   - 9/16: Declining with therapies; pleasantly confused today, interactions much less withdrawn but nonsensical. Will need Mod-Max A at home. Concern for physical decline exceeding family ability for caregiving if  no improvements moving forward.   - 9-17 waiting on calorie counts today to determine if feeding tube needed; if patient/family continue to refuse, would recommend goals of care discussion and possible palliative care consult--patient opted for placement of tube feed  - 9-18: Due to ongoing significant support needed to maintain blood pressure, as well as chills today, discussed additional workup/treatment with infectious disease; recommend against treatment of yeast at this time given likely contamination, given ongoing decline will get lactic acid and repeat CT abdomen and pelvis without contrast in a.m--lactic acid upper limit of normal 1.9, CT abdomen and pelvis results -right ventral pelvic wall fluid collection not changed in size from prior study, distal colonic diverticulosis, small bilateral pleural effusions and dependent lower lobe atelectasis, IR consulted yesterday but holding off on drainage of fluid collection  - . Repeat imaging performed yesterday and shows stable fluid collection in the abdominal wall. Patient without elevated WBC, remains afebrile, collection appears similar in size and nature. Per Dr. Jenna, no aspiration or  drainage planned for now.    -  9/23: Wife continues to do all cares - doing walking trials with OT to work on endurance and transfers, remains Mod-Max A to stand but once on his feet can be Min A 46 feet. PT Mod-Max A sit to stand. PO intakes much improved last 2 days.  Likely stedy; has home hospital bed without power lift so will check height for transfers.   - Family meeting today: Est Min-Mod A at home, Wife to work with patient closely over the next week to determine if able to handle needs at home.     2.  H/o PE/Antithrombotics: -DVT/anticoagulation:  Pharmaceutical: Eliquis              -antiplatelet therapy: N/A  - No further blood appreciated in nephrostomy tubes  3. Pain Management: Oxycodone  prn.    - 9/16: Oxycodone  was removed yesterday due to concerns of this contributing to PM confusion and hypotension; nursing reports family in disagreement with this and patient pain increased. Adjust to Tylenol  PRN for mild to moderate, Percocet 5 mg for severe pain to avoid undertreatment. Monitor BP VERY closely  9-18: Has not required Percocet again since 9-15  9/21 patient reports pain is overall under control  4. Mood/Behavior/Sleep: LCSW to follow for evaluation and support.              --melatonin for insomnia.              -antipsychotic agents: N/A  9-14: Per patient, sleeping well.  Sleep log.  Discussed delirium management with patient's wife  - 9-18 sleep log remains appropriate, no apparent hallucinations or delusions.  Continue current management  5. Neuropsych/cognition/encephalopathy likely secondary to metabolic acidosis vs delirium from prolonged illness/hospitalization: This patient is not capable of making decisions on his own behalf.  - 9-11: Increasingly withdrawn, behavioral changes may be secondary to poor nutritional intake and hyponatremia.  Treating as below, on sertraline  100 mg daily, may need to add additional agent such as mirtazapine but hesitant given ongoing  cognitive issues.  9-12: Slightly better today, but consistent with hypoactive delirium with primary difficulty with concentration, no focal neurologic deficits.  Start overnight sleep log, delirium precautions.  Suspect hyponatremia as primary contributor, management as below.  9/13: D3C Flexeril , Megace , Zoloft  due to ongoing cognitive changes.  Sodium is improving.  Get LFTs, ammonia today to evaluate ongoing cognitive deficits--within normal limits.  -14: Cognition seems to be clearing  a bit, continue current regimen.   9/15 alert but delayed. Continue to maximize renal/volume/nutritional status 9/16: Much more alert, but delirious; sleep log appears appropriate. Will add Zyprexa  5 mg PRN for aggitation d/t increased aggitation yesterday afternoon. Keep low dose / low likelihood of Qtc prolongation medications only.   9-18: Cognition improving very gradually, QTc remains prolonged.  DC zyprexa  due to QTC   9-22: Doing much better this week.  Continue with adequate nutrition, hydration    6. Skin/Wound Care: Routine pressure relief measures. Encourage adequate diet             --dressing changes daily. Gerhardt's cream to buttock wound.   -abdominal wound continues to close despite tenous nutritional status  7. Fluids/Electrolytes/Nutrition/oropharyngeal dysphagia/moderate to severe protein calorie malnutrition:                --9/10 periactin  yesterday stopped d/t lethargy   -will try megace  beginning today   -RD f/u   -push fluids   -recheck bmet in am   -continue juven bid. Has been refusing Ensure supplement.   -kdur 20meq daily   -can't use zofran  d/t QTc prolongation (490)  - MBS   9/3--D3/thins, prealbumin borderline normal at 20   - 9-11: Nausea remains a primary barrier to adequate p.o. intakes.  Recheck EKG today, QTc was 460, scheduled Zofran  4 mg IV 3 times daily AC.  Will repeat EKG in next 1 to 2 days.  - 9-12: Albumin  infusion per nephrology.  P.o. intakes improving with  Zofran .  per IV team midline contraindicated due to bicarb.  May get PICC line once dialysis no longer considered.  9-14: P.o. intakes okay, Zofran  continues to help with nausea.  Per nephrology, okay to reduce fluids, will keep on 70 cc/h normal saline for maintenance for now.  PICC line order placed.  9/15 intake 20-30%. Receiving mtc IVF.    -k+ 3.3, mg++ 1.3   -continue kdur 20meq daily and add 40meq to IVF   -IV mag sulfae 2g x1, add mag gluconate oral daily    9/16: Mag, K repleted. PO intakes remain very poor. Albumin  2.0. Continue maintenance fluids--will likely need TF.   9/17: Ad phosnak 1 packet x3 doses for repletion of K and Phos; repeat in AM--nephrology changed to IV--repleted 9-18  9-22: Nutrition doing much better, eating 50 to 100% of meals.  Replete electrolytes as below. 9/23: Finishing calorie count per SLP today - PO intakes MUCH better - hopeful to remove cortrak tube otherwise will get IR consult to evaluate if PEG possible 9-24: Dietary stating patient is to keep up with 50% of his energy needs, can consider removing core track tube.  Will wait for labs in a.m. to ensure nutrition and hydration continue to be adequate before removing; did just DC free water  flushes and IV fluids 9-25: Core track DC'd, patient labs look okay.  Really encourage p.o. fluids, resuming IV maintenance as below due to tachycardia but should really push p.o. intakes this week. 9-26: P.o. intakes really good, bring maintenance fluids down to 40 cc/h, BMP and Mag in AM  8. Sepsis/Abdominal wall/mesh infection: IV Vanc/Ceftriaxone  for 6 weeks from 08/21 (10/2)             --Probable 6 months total of antibiotics  --Corynebacterium species-->6 more weeks ?Dalbavancin for OP use, cont ceftriazone and linezolid  for now  per ID -appreciate recent ID f/u CRP nl 0.8 ESR 44 WBC 8.2 9-11: WBC labs remained stable. 9/15 wbc  7.1 9/16: Discussed with Dr. Dennise ID possibility of encephalopathy with current  antibiotics, as well as +UA yesterday. Recommendation to continue current broad spectrum antibiotics as they are unlikely contributors and regimen has already been altered from vancomycin . Given some yeast in UA will await culture to see if secondary fungal infection present vs contaminant. -pending 9/17 9/18; -LA 1.9, CT abdomen and pelvis results  --see above 9-22: Per Dr Dennise,  then continue oritavancin  and ceftriaxone  till 10/2.  After which can do 6 more weeks on omdada+dalvance/tedezolid.  Continue Rocephin .-- will double check with ID on this timeline 9/23 9-24: Infectious disease, pharmacy working with family today on discharge antibiotic regimen from a cost intolerance perspective; I agree with trialing his home antibiotic regimen in the hospital to ensure that nausea does not limit him again 9-25: Extensive discussions with infectious disease Dr. Overton and Dr. Sheldon today; feel mesh infection is less likely, decision to remove antibiotics and monitor closely over the next week.  Patient will get CT abdomen close to discharge to reevaluate size of abscess.  CRPs every 72 hours and other labs for monitoring per ID.  Patient and family understanding and agreeable with this.  9. Left> right hydronephrosis: Nephrostomy tubes with stent exchanged on 08/27 --continue clamping right tube and left to drain per urology.  Monitor for blood recheck CBC in am --hemoglobin stable, no further bloody output appreciated -9/15 flush left nephrostomy tube 9-18: Prior right tube site continues to drain, CT abdomen and pelvis as above  10. Acute on chronic renal failure/hyponatremia:  elevated CR- baseline creat probably ~1.4  -9/10 will give addnl IVF today d/t poor po intake   -recheck bmet in am  - 9-11: AKI significantly worse, now with hyponatremia 131.  Will give 1 L fluid bolus normal saline today, recheck BMP; given brisk uptrend despite increase in IV fluids will consult nephrology for further  recommendations.  - 9-12: Appreciate nephrology input and recommendations.  Responding slightly to increased IV fluids overnight, normal saline 125/h.  Hyponatremia remains 131. On multiple medications that could be contributing including Florinef , sertraline , and linezolid .  See their note for specific recommendations; advised to remain on fluids for primary treatment of hyponatremia, do not recommend salt tabs at this time.  - If renal function does not improve with fluids may need CT scan of the abdomen and pelvis to verify patency of nephrostomy/ileal conduit drainage   9-13: Potassium chloride  packet 40 mill equivalents ordered this a.m. for repletion.  Sodium improving, bicarb improving, but renal function remains unchanged on bicarb drip.  9-14: Renal function improved, acidosis resolved, sodium 134.  Per nephrology, can DC fluids, but agreeable to maintenance fluids given ongoing poor p.o. intakes.  Continue to monitor closely.  9/15-17 Cr continues to improve gradually, continue IVF, push po  9/18: On TF, DC IVF, move to FWF 200 ccs QID--monitor closely  9-19: Seems to be tolerating free water  flushes well, continuing to improve.  Continue through this weekend.     9/21 creatinine down to 1.29/BUN 40 continue current regimen and monitor  9-22: BUN 35, creatinine 1.19.  9/23: ok to check renal function 2x weekly, now GFR >60. DC FWF, encourage PO. --Labs in a.m.  9-25: Renal function stable, however resuming some IV fluids as below.  9/26: Encourage p.o., reducing IV fluids to 40 cc/h.  BMP tomorrow--can come off of IV fluids over this weekend if p.o. fluid intakes are adequate  11. OSA: Encourage CPAP use 12. Hypotension/tachycardia/?adrenal  insufficiency: Continue Midodrine  15 mg TID and Florinef  2 mg daily. Plasma cortisol 13.6 6/25/             --continues to have significant tachycardia with activity likely due to orthostasis and deconditioning.  - -hgb 11.5   -continue metoprolol  low  dose 12.5mg  bid, bp remains soft but stable at present   -  IVF as above Per PT- has been not dropping in therapy   9-13: Orthostatic today, DC Lopressor  and increase midodrine  to 20 mg 3 times daily.  Has been getting a significant amount of IV fluid last few days, still likely volume depleted per nephrology.  9-14: Blood pressure remains stable, but soft. 9/15 hypotensive this morning.  Continue IVF and plan as above 9/16: A bit improved today; continue IVF, no signs of volume overload. If profoundly hypotensive again may need to transfer to acute given maximizing fluids/BP support without pressors.  9-18: DC IV fluid as above today; monitor blood pressures very closely, seem more stable than they were last week.  On chart review, was minimally responsive to trial of high-dose steroids in August; does not look like cosyntropin stim test was ever performed; Continue Florinef  for now, may need to consider if blood pressures do not improve 9-19: Blood pressure holding up in the low 100s, continue current regimen. 9/20-21 BP stable, continue to monitor  9/23: Stable -reduce midodrine  back to 15 mg 3 times daily--> 10 mg 3 times daily 9-24.  If tolerates well, will start reducing Florinef  next.  May need rate control agent although current tachycardia seems to be compensatory sinus tach 9-24: Continues to get tachycardic into the 150s with exertion, comes down to 90s to 110s with rest.  Discussed with cardiology, appreciate recommendations, clearly compensatory and should not treat.  Given poor p.o. fluid intakes, will resume IV normal saline at 70 cc/h for maintenance and really encourage p.o. fluids 9-26: Looking much better today.  Reduce maintenance fluids as above.  Monitor on reduced dose of Florinef  over the weekend, and if he does well would further reduce to 0.05 mg from Monday     08/07/2024    6:15 AM 08/07/2024    2:35 AM 08/06/2024   10:42 PM  Vitals with BMI  Systolic 117 117 884   Diastolic 66 66 65  Pulse 88 80 94     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  -- resolved  9/1 held reglan   -9/16: resume reglan  given low OP, nausea--with benefit, output remains low   - 9/18: Ostomy output picking up considerably with tube feeds; on fibercon BID; appreciate nutrition input--staying appropriate   - 9/23: Ostomy OP appropriate  14. Depression: On Zoloft -- Dced 9/13 due to hyponatremia and confusion   - 9/16: Family concerned for serotonin syndrome with zoloft +linezolid - no tachycardia, fevers, seizures, or severe agitation reported - encephalopathy has not improved with removal of zoloft . Low suspicion at this time.   15. Malnutrition/: Albumin  @1 .9/TP-5.1.  --> 2.2 9/11;  - 9-11: Adding Premeal Zofran  as above.  Will get nutrition to start calorie count; may need additional supplementation or discussion of feeding tube if does not improve.  - 9-12: Slightly improved, continue this regimen.  9-13: Very poor p.o. intakes yesterday.  DC Megace  due to possible cognitive effects, no perceived benefit.  Will discuss possible feeding tube with family--patient rejects, will get calorie count for 48 hours and if no improvement may readdress tube feed versus palliative consult  9-14: Wife encouraging  p.o.'s, patient motivated to avoid feeding tube.  Calorie count started.  Maintenance fluids as above.  9/16: Qtc 519; DC zofran , resume relgan 5 mg TID AC as he did well with this in the past and ostomy output has slowed considerably  9-17: QTc remains prolonged; will not resume Zofran  at this time.  Patient agreeable to placement of core track and discussed with nutrition starting with nocturnal tube feeds for supplementation, to allow appetite during the day to continue to work on increasing p.o.'s.  Will give one-time Xanax  to assist in anxiety with core track placement.  9-18: Tolerating tube feeds well, labs looking stable.  P.o. intakes poor this a.m. due to timing of  therapy/medication/breakfast, continue to encourage as primary form of nutrition.  9-19: Tolerating overnight tube feeds well, p.o. intakes during the day complicated by gagging/coughing due to tube feed placement.  Chest x-ray yesterday was negative.  Orders to maintain head of bed 30 degrees whether or not tube feeds are running, and viscous lidocaine  4 times daily as needed for sensation of gagging.  9/20 nausea noted today, patient did get  dose of Compazine  and is continued on Reglan .  Will check with pharmacy to see if any new medications could be contributing.Appears he was transition to nocturnal tube feeds may be rate a little high for him will decrease to 60 mL/h overnight.  Discussed with pharmacy he got a one-time dose of Oritivancin yesterday, he should not receive any more of this as this was a one-time order  -9-22- nausea improved with current regimen--helping considerably with p.o. intakes, may be able to DC tube feeds this week.  Dietary resumed calorie count today.  9/23: Nausea resolved; pending stable PO intakes may DC reglan  this week--DC 9-26   16. Anemia of chronic illness: Stable overall 9/8-11  - Hemoglobin stable 9-10  9-22:Hemoglobin from 9.7->8.6   No obvious sources of bleeding, repeat in AM.  9/23: HgB stable 8.3--> 8.0       Latest Ref Rng & Units 08/06/2024   12:33 AM 08/04/2024    4:54 AM 08/03/2024    3:51 AM  CBC  WBC 4.0 - 10.5 K/uL 7.0  6.0  6.4   Hemoglobin 13.0 - 17.0 g/dL 8.0  8.3  8.6   Hematocrit 39.0 - 52.0 % 25.6  25.9  27.8   Platelets 150 - 400 K/uL 132  72  71     17.  Thrombocytopenia.  Mild, 136 today.  Monitor very closely in setting of linezolid .   - 9/19: 104; per Dr. Dennise likely adjusting linezolid  to orvitatancin starting Monday.  Continue to trend  9-22: Remains downtrending, trend.  71 today.  May take a few days to bounce back from linezolid   9/23: PLT stabilized 72; monitor  9-25: Platelets starting to come back, 130s today.  Would be  cautious to resume linezolid    18.  Hypophosphatemia.  1.3 to 3.1 with IV repletion, next day 1.6.  - 9-19: Per electrolyte protocol, start Phos-Nak Powder via tube feed 2 packets 4 times daily for 5 doses  - Repeat daily.  -9/20 replete today, 15mmol IV,  discussed dose pharmacy  -9/21 improved to 1.9, continue replete per pharm rec   -22: 2.3, replete today, check in a.m.--repleted  19. Low MG  -9/20 replete today 2g IV,  discussed dose pharmacy  -9/21 improved today continue to monitor  9-22: Recheck in AM.--repleted  -25: 1.5.  Replete IV today; recheck 9-27   LOS: 27  days A FACE TO FACE EVALUATION WAS PERFORMED  Kristopher Thompson Likes 08/07/2024, 10:38 AM

## 2024-08-07 NOTE — Progress Notes (Signed)
 Physical Therapy Session Note  Patient Details  Name: Kristopher Thompson MRN: [id number] Date of Birth: 01/17/1951  Today's Date: 08/07/2024 PT Individual Time: 1100-1200, 1415-1530 PT Individual Time Calculation (min): 60 min, 75 min  Short Term Goals: Week 3:  PT Short Term Goal 1 (Week 3): STGs = LTGs  Skilled Therapeutic Interventions/Progress Updates:   Session 1:  Pt received upright in wc, agreeable to therapy and w/ no c/o pain. Pt wheeled to ortho gym for energy conservation.  Manual therapy performed on B scalenes and SCMs to decrease cervical protraction, improve upward gaze and decrease fall risk. Vibration therapy and trigger point massage used to achieve this. Pt w/ improved cervical extension ROM after therapy, able to rest his head back on headrest. Pt performed 5 resisted sit to stand transfers w/ RW and yellow TB to improve functional activity tolerance. Pt improved on each repetition, requiring modA on first attempt, progressing to CGA. Pt demonstrated improved tolerance to exercise, requiring less rest time between reps. HR and SOB also decreased with each succeeding repetition.  At end of session, pt wheeled back for time and energy conservation, given call bell and all other needs in reach.   Session 2:  Pt received upright in w/c and agreeable to therapy. Pt w/ no c/o pain throughout session. Pt wheeled down to ortho gym for circuit training activity.   Pt ambulated ~88ft, 10ft w/ RW and CGA from one mat to the other, picked up bean bags and tossed them through a hoop before turning and walking back to start, x3 w/ ample rest time between reps. Total ambulation ~185ft. Despite fatigue, pt assist level did not increase during activity, requiring CGA throughout.   After circuit training activity, pt participated in 15 minutes on NuStep, lvl 2 to improve general mobility and circulation. Pt performed stand step transfer to/from wc<>NuStep w/ CGA, RW, and increased time. At  end of session, pt wheeled back to room and performed stand step transfer w/ HHA to EOB, then side steps up to [facility] after short rest. Pt required minA for sit to supine bed mobility. Given call bell and all needs within reach.   Therapy Documentation Precautions:  Precautions Precautions: Fall (HOH, Urostomy, Colostomy, PCN  drain, and orthostatic hypotension.) Recall of Precautions/Restrictions: Impaired Precaution/Restrictions Comments: urostomy, colostomy, lt PCN drain, watch BP and HR Restrictions Weight Bearing Restrictions Per Provider Order: No   Therapy/Group: Individual Therapy  [name] 08/07/2024, 12:19 PM

## 2024-08-08 LAB — BASIC METABOLIC PANEL WITH GFR
Anion gap: 10 (ref 5–15)
BUN: 21 mg/dL (ref 8–23)
CO2: 18 mmol/L — ABNORMAL LOW (ref 22–32)
Calcium: 8.5 mg/dL — ABNORMAL LOW (ref 8.9–10.3)
Chloride: 112 mmol/L — ABNORMAL HIGH (ref 98–111)
Creatinine, Ser: 1.21 mg/dL (ref 0.61–1.24)
GFR, Estimated: >60 mL/min (ref >60)
Glucose, Bld: 91 mg/dL (ref 70–99)
Potassium: 4 mmol/L (ref 3.5–5.1)
Sodium: 140 mmol/L (ref 135–145)

## 2024-08-08 LAB — MAGNESIUM: Magnesium: 2 mg/dL (ref 1.7–2.4)

## 2024-08-08 MED ORDER — SODIUM CHLORIDE 0.9% FLUSH
10.0000 mL | Freq: Two times a day (BID) | INTRAVENOUS | Status: DC
  Administered 2024-08-08 – 2024-08-14 (×7): 10 mL

## 2024-08-08 MED ORDER — SODIUM CHLORIDE 0.9% FLUSH: 10.0000 mL | INTRAVENOUS | Status: DC | PRN

## 2024-08-08 NOTE — Plan of Care (Signed)
  Problem: Consults Goal: RH GENERAL PATIENT EDUCATION Description: See Patient Education module for education specifics. Outcome: Progressing   Problem: RH BOWEL ELIMINATION Goal: RH STG MANAGE BOWEL WITH ASSISTANCE Description: STG Manage Bowel with supervision Assistance. Outcome: Progressing   Problem: RH BLADDER ELIMINATION Goal: RH STG MANAGE BLADDER WITH ASSISTANCE Description: STG Manage Bladder With supervision Assistance Outcome: Progressing   Problem: RH SKIN INTEGRITY Goal: RH STG SKIN FREE OF INFECTION/BREAKDOWN Description: Manage skin free of infection with supervision assistance Outcome: Progressing   Problem: RH SAFETY Goal: RH STG ADHERE TO SAFETY PRECAUTIONS W/ASSISTANCE/DEVICE Description: STG Adhere to Safety Precautions With Assistance/Device. Outcome: Progressing   Problem: RH PAIN MANAGEMENT Goal: RH STG PAIN MANAGED AT OR BELOW PT'S PAIN GOAL Description: <4 w/ prns Outcome: Progressing   Problem: RH KNOWLEDGE DEFICIT GENERAL Goal: RH STG INCREASE KNOWLEDGE OF SELF CARE AFTER HOSPITALIZATION Description: Manage increase knowledge of self care care after hospitalization with supervision assistance from wife using educational materials provided Outcome: Progressing

## 2024-08-08 NOTE — Plan of Care (Signed)
  Problem: RH BOWEL ELIMINATION Goal: RH STG MANAGE BOWEL WITH ASSISTANCE Description: STG Manage Bowel with supervision Assistance. Outcome: Progressing   Problem: RH PAIN MANAGEMENT Goal: RH STG PAIN MANAGED AT OR BELOW PT'S PAIN GOAL Description: <4 w/ prns Outcome: Progressing   Problem: RH KNOWLEDGE DEFICIT GENERAL Goal: RH STG INCREASE KNOWLEDGE OF SELF CARE AFTER HOSPITALIZATION Description: Manage increase knowledge of self care care after hospitalization with supervision assistance from wife using educational materials provided Outcome: Not Progressing

## 2024-08-08 NOTE — Progress Notes (Signed)
 PROGRESS NOTE   Subjective/Complaints: No new complaints this morning, wife asks for q4H vitals while awake due to history of infections- would like temperature and HR to be monitored more closely   ROS: Patient denies fever, rash, sore throat, blurred vision, dizziness,  , vomiting, diarrhea, cough, shortness of breath or chest pain, joint or back/neck pain, headache, or mood change.     Objective:   No results found.     Recent Labs    08/06/24 0033  WBC 7.0  HGB 8.0*  HCT 25.6*  PLT 132*    Recent Labs    08/06/24 0033 08/08/24 0341  NA 136 140  K 3.8 4.0  CL 107 112*  CO2 19* 18*  GLUCOSE 96 91  BUN 31* 21  CREATININE 1.17 1.21  CALCIUM  8.5* 8.5*    Intake/Output Summary (Last 24 hours) at 08/08/2024 2002 Last data filed at 08/08/2024 1756 Gross per 24 hour  Intake 2135 ml  Output 1950 ml  Net 185 ml        Physical Exam: Vital Signs Blood pressure 124/74, pulse 100, temperature 98.3 F (36.8 C), temperature source Oral, resp. rate 16, height 5' 11 (1.803 m), weight 105 kg, SpO2 100%.   Constitutional: No acute distress.  Sitting up in bedside chair. Wife is at bedside HEENT: NCAT, EOMI, oral membranes moist.    Neck: supple, no JVD. Cardiovascular: RRR without murmur.  1+ bilateral upper extremity and lower extremity edema. Respiratory/Chest: CTA Bilaterally without wheezes or rales. Normal efforrt.  GI/Abdomen: soft, non-tender, non-distended.  Positive bowel sounds ostomy with liquid brown stool--mild amount. Ext: no clubbing, cyanosis. Psych: Appropriate mood and affect.  Now joking. PRIOR EXAMS: Skin:  abdominal wound  with pink with granulation tissue, moist, covered in clean dressing. --Unchanged Left nephrostomy tube with bag seal in place. --Unchanged R flank draining serosanguineous fluid, increased over the last 2 days no obvious thick or white/green discharge.-26 Urostomy with  some sediment, mostly clear output. --Unchanged Ileostomy well-circumscribed, underlying pink healthy tissue  Neuro:   AAO.  [name], hospital, and time. Attention much improved from last week, can answer complex questions with mild deficits.  Complicated by poor hearing.  Normal language and speech.  Cranial nerve exam unremarkable.  MMT: BUE 4/5 prox to distal. BLE 4-/5 HF, 4/5 KE and 4/5 ADF/PF-improving No limb ataxia or cerebellar signs.  Bilateral upper and lower extremity intention tremors--none apparent today no abnormal tone appreciated.   Musculoskeletal: Able to straighten both knees out on bed.  Plantarflexion tone bilateral lower extremities, barely able to range to neutral - unchanged   Assessment/Plan: 1. Functional deficits which require 3+ hours per day of interdisciplinary therapy in a comprehensive inpatient rehab setting. Physiatrist is providing close team supervision and 24 hour management of active medical problems listed below. Physiatrist and rehab team continue to assess barriers to discharge/monitor patient progress toward functional and medical goals  Care Tool:  Bathing    Body parts bathed by patient: Chest, Face, Abdomen, Right arm, Left arm, Front perineal area, Right upper leg, Left upper leg   Body parts bathed by helper: Buttocks, Right lower leg, Left lower leg  Bathing assist Assist Level: Moderate Assistance - Patient 50 - 74%     Upper Body Dressing/Undressing Upper body dressing   What is the patient wearing?: Hospital gown only    Upper body assist Assist Level: Moderate Assistance - Patient 50 - 74%    Lower Body Dressing/Undressing Lower body dressing      What is the patient wearing?: Pants     Lower body assist Assist for lower body dressing: Maximal Assistance - Patient 25 - 49%     Toileting Toileting Toileting Activity did not occur (Clothing management and hygiene only): N/A (no void or bm)  Toileting  assist Assist for toileting: Dependent - Patient 0%     Transfers Chair/bed transfer  Transfers assist  Chair/bed transfer activity did not occur: Safety/medical concerns  Chair/bed transfer assist level: Moderate Assistance - Patient 50 - 74%     Locomotion Ambulation   Ambulation assist   Ambulation activity did not occur: Safety/medical concerns (fatigue/dizziness/orthostatics)          Walk 10 feet activity   Assist  Walk 10 feet activity did not occur: Safety/medical concerns (fatigue/dizziness/orthostatics)        Walk 50 feet activity   Assist Walk 50 feet with 2 turns activity did not occur: Safety/medical concerns (fatigue/dizziness/orthostatics)         Walk 150 feet activity   Assist Walk 150 feet activity did not occur: Safety/medical concerns (fatigue/dizziness/orthostatics)         Walk 10 feet on uneven surface  activity   Assist Walk 10 feet on uneven surfaces activity did not occur: Safety/medical concerns (fatigue/dizziness/orthostatics)         Wheelchair     Assist Is the patient using a wheelchair?: Yes Type of Wheelchair: Manual           Wheelchair 50 feet with 2 turns activity    Assist    Wheelchair 50 feet with 2 turns activity did not occur: Safety/medical concerns       Wheelchair 150 feet activity     Assist  Wheelchair 150 feet activity did not occur: Safety/medical concerns       Blood pressure 124/74, pulse 100, temperature 98.3 F (36.8 C), temperature source Oral, resp. rate 16, height 5' 11 (1.803 m), weight 105 kg, SpO2 100%. Medical Problem List and Plan: 1. 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  Undergoing  medical workups as below, will monitor improvement for the next couple of days --address at teams if current level of care exceeds ability of home caregivers.  - 9-13: Bilateral lower extremity PRAFO's in bed for plantarflexion/tight heel cords  --Continue CIR therapies including PT, OT, and SLP. Progress has been very slow. May need to consider other discharge venue   - 9/16: Declining with therapies; pleasantly confused today, interactions much less withdrawn but nonsensical. Will need Mod-Max A at home. Concern for physical decline exceeding family ability for caregiving if no improvements moving forward.   - 9-17 waiting on calorie counts today to determine if feeding tube needed; if patient/family continue to refuse, would recommend goals of care discussion and possible palliative care consult--patient opted  for placement of tube feed  - 9-18: Due to ongoing significant support needed to maintain blood pressure, as well as chills today, discussed additional workup/treatment with infectious disease; recommend against treatment of yeast at this time given likely contamination, given ongoing decline will get lactic acid and repeat CT abdomen and pelvis without contrast in a.m--lactic acid upper limit of normal 1.9, CT abdomen and pelvis results -right ventral pelvic wall fluid collection not changed in size from prior study, distal colonic diverticulosis, small bilateral pleural effusions and dependent lower lobe atelectasis, IR consulted yesterday but holding off on drainage of fluid collection  - . Repeat imaging performed yesterday and shows stable fluid collection in the abdominal wall. Patient without elevated WBC, remains afebrile, collection appears similar in size and nature. Per Dr. Jenna, no aspiration or drainage planned for now.    -  9/23: Wife continues to do all cares - doing walking trials with OT to work on endurance and transfers, remains Mod-Max A to stand but once on his feet can be  Min A 46 feet. PT Mod-Max A sit to stand. PO intakes much improved last 2 days.  Likely stedy; has home hospital bed without power lift so will check height for transfers.   - Family meeting today: Est Min-Mod A at home, Wife to work with patient closely over the next week to determine if able to handle needs at home.     2.  H/o PE/Antithrombotics: -DVT/anticoagulation:  Pharmaceutical: Eliquis              -antiplatelet therapy: N/A  - No further blood appreciated in nephrostomy tubes  3. Pain Management: Oxycodone  prn.    - 9/16: Oxycodone  was removed yesterday due to concerns of this contributing to PM confusion and hypotension; nursing reports family in disagreement with this and patient pain increased. Adjust to Tylenol  PRN for mild to moderate, Percocet 5 mg for severe pain to avoid undertreatment. Monitor BP VERY closely  9-18: Has not required Percocet again since 9-15  9/21 patient reports pain is overall under control  4. Mood/Behavior/Sleep: LCSW to follow for evaluation and support.              --melatonin for insomnia.              -antipsychotic agents: N/A  9-14: Per patient, sleeping well.  Sleep log.  Discussed delirium management with patient's wife  - 9-18 sleep log remains appropriate, no apparent hallucinations or delusions.  Continue current management  5. Neuropsych/cognition/encephalopathy likely secondary to metabolic acidosis vs delirium from prolonged illness/hospitalization: This patient is not capable of making decisions on his own behalf.  - 9-11: Increasingly withdrawn, behavioral changes may be secondary to poor nutritional intake and hyponatremia.  Treating as below, on sertraline  100 mg daily, may need to add additional agent such as mirtazapine but hesitant given ongoing cognitive issues.  9-12: Slightly better today, but consistent with hypoactive delirium with primary difficulty with concentration, no focal neurologic deficits.  Start overnight sleep log,  delirium precautions.  Suspect hyponatremia as primary contributor, management as below.  9/13: D3C Flexeril , Megace , Zoloft  due to ongoing cognitive changes.  Sodium is improving.  Get LFTs, ammonia today to evaluate ongoing cognitive deficits--within normal limits.  -14: Cognition seems to be clearing a bit, continue current regimen.   9/15 alert but delayed. Continue to maximize renal/volume/nutritional status 9/16: Much more alert, but delirious; sleep log appears appropriate. Will add Zyprexa  5 mg PRN for aggitation d/t increased  aggitation yesterday afternoon. Keep low dose / low likelihood of Qtc prolongation medications only.   9-18: Cognition improving very gradually, QTc remains prolonged.  DC zyprexa  due to QTC   9-22: Doing much better this week.  Continue with adequate nutrition, hydration    6. Skin/Wound Care: Routine pressure relief measures. Encourage adequate diet             --dressing changes daily. Continue Gerhardt's cream to buttock wound.   -abdominal wound continues to close despite tenous nutritional status  7. Fluids/Electrolytes/Nutrition/oropharyngeal dysphagia/moderate to severe protein calorie malnutrition:                --9/10 periactin  yesterday stopped d/t lethargy   -will try megace  beginning today   -RD f/u   -push fluids   -recheck bmet in am   -continue juven bid. Has been refusing Ensure supplement.   -kdur 20meq daily   -can't use zofran  d/t QTc prolongation (490)  - MBS   9/3--D3/thins, prealbumin borderline normal at 20   - 9-11: Nausea remains a primary barrier to adequate p.o. intakes.  Recheck EKG today, QTc was 460, scheduled Zofran  4 mg IV 3 times daily AC.  Will repeat EKG in next 1 to 2 days.  - 9-12: Albumin  infusion per nephrology.  P.o. intakes improving with Zofran .  per IV team midline contraindicated due to bicarb.  May get PICC line once dialysis no longer considered.  9-14: P.o. intakes okay, Zofran  continues to help with nausea.   Per nephrology, okay to reduce fluids, will keep on 70 cc/h normal saline for maintenance for now.  PICC line order placed.  9/15 intake 20-30%. Receiving mtc IVF.    -k+ 3.3, mg++ 1.3   -continue kdur 20meq daily and add 40meq to IVF   -IV mag sulfae 2g x1, add mag gluconate oral daily    9/16: Mag, K repleted. PO intakes remain very poor. Albumin  2.0. Continue maintenance fluids--will likely need TF.   9/17: Ad phosnak 1 packet x3 doses for repletion of K and Phos; repeat in AM--nephrology changed to IV--repleted 9-18  9-22: Nutrition doing much better, eating 50 to 100% of meals.  Replete electrolytes as below. 9/23: Finishing calorie count per SLP today - PO intakes MUCH better - hopeful to remove cortrak tube otherwise will get IR consult to evaluate if PEG possible 9-24: Dietary stating patient is to keep up with 50% of his energy needs, can consider removing core track tube.  Will wait for labs in a.m. to ensure nutrition and hydration continue to be adequate before removing; did just DC free water  flushes and IV fluids 9-25: Core track DC'd, patient labs look okay.  Really encourage p.o. fluids, resuming IV maintenance as below due to tachycardia but should really push p.o. intakes this week. 9-26: P.o. intakes really good, bring maintenance fluids down to 40 cc/h, BMP and Mag in AM  8. Sepsis/Abdominal wall/mesh infection: IV Vanc/Ceftriaxone  for 6 weeks from 08/21 (10/2)             --Probable 6 months total of antibiotics  --Corynebacterium species-->6 more weeks ?Dalbavancin for OP use, cont ceftriazone and linezolid  for now  per ID -appreciate recent ID f/u CRP nl 0.8 ESR 44 WBC 8.2 9-11: WBC labs remained stable. 9/15 wbc 7.1 9/16: Discussed with Dr. Dennise ID possibility of encephalopathy with current antibiotics, as well as +UA yesterday. Recommendation to continue current broad spectrum antibiotics as they are unlikely contributors and regimen has already been  altered from  vancomycin . Given some yeast in UA will await culture to see if secondary fungal infection present vs contaminant. -pending 9/17 9/18; -LA 1.9, CT abdomen and pelvis results  --see above 9-22: Per Dr Dennise,  then continue oritavancin  and ceftriaxone  till 10/2.  After which can do 6 more weeks on omdada+dalvance/tedezolid.  Continue Rocephin .-- will double check with ID on this timeline 9/23 9-24: Infectious disease, pharmacy working with family today on discharge antibiotic regimen from a cost intolerance perspective; I agree with trialing his home antibiotic regimen in the hospital to ensure that nausea does not limit him again 9-25: Extensive discussions with infectious disease Dr. Overton and Dr. Sheldon today; feel mesh infection is less likely, decision to remove antibiotics and monitor closely over the next week.  Patient will get CT abdomen close to discharge to reevaluate size of abscess.  CRPs every 72 hours and other labs for monitoring per ID.  Patient and family understanding and agreeable with this.  9. Left> right hydronephrosis: Nephrostomy tubes with stent exchanged on 08/27 --continue clamping right tube and left to drain per urology.  Monitor for blood recheck CBC in am --hemoglobin stable, no further bloody output appreciated -9/15 flush left nephrostomy tube 9-18: Prior right tube site continues to drain, CT abdomen and pelvis as above  10. Acute on chronic renal failure/hyponatremia:  elevated CR- baseline creat probably ~1.4  -9/10 will give addnl IVF today d/t poor po intake   -recheck bmet in am  - 9-11: AKI significantly worse, now with hyponatremia 131.  Will give 1 L fluid bolus normal saline today, recheck BMP; given brisk uptrend despite increase in IV fluids will consult nephrology for further recommendations.  - 9-12: Appreciate nephrology input and recommendations.  Responding slightly to increased IV fluids overnight, normal saline 125/h.  Hyponatremia remains 131. On  multiple medications that could be contributing including Florinef , sertraline , and linezolid .  See their note for specific recommendations; advised to remain on fluids for primary treatment of hyponatremia, do not recommend salt tabs at this time.  - If renal function does not improve with fluids may need CT scan of the abdomen and pelvis to verify patency of nephrostomy/ileal conduit drainage   9-13: Potassium chloride  packet 40 mill equivalents ordered this a.m. for repletion.  Sodium improving, bicarb improving, but renal function remains unchanged on bicarb drip.  9-14: Renal function improved, acidosis resolved, sodium 134.  Per nephrology, can DC fluids, but agreeable to maintenance fluids given ongoing poor p.o. intakes.  Continue to monitor closely.  9/15-17 Cr continues to improve gradually, continue IVF, push po  9/18: On TF, DC IVF, move to FWF 200 ccs QID--monitor closely  9-19: Seems to be tolerating free water  flushes well, continuing to improve.  Continue through this weekend.     9/21 creatinine down to 1.29/BUN 40 continue current regimen and monitor  9-22: BUN 35, creatinine 1.19.  9/23: ok to check renal function 2x weekly, now GFR >60. DC FWF, encourage PO. --Labs in a.m.  9-25: Renal function stable, however resuming some IV fluids as below.  9/26: Encourage p.o., reducing IV fluids to 40 cc/h.  BMP tomorrow--can come off of IV fluids over this weekend if p.o. fluid intakes are adequate  11. OSA: Encourage CPAP use 12. Hypotension/tachycardia/?adrenal insufficiency: Continue Midodrine  15 mg TID and Florinef  2 mg daily. Plasma cortisol 13.6 6/25/             --continues to have significant tachycardia with activity likely  due to orthostasis and deconditioning.  - -hgb 11.5   -continue metoprolol  low dose 12.5mg  bid, bp remains soft but stable at present   -  IVF as above Per PT- has been not dropping in therapy   9-13: Orthostatic today, DC Lopressor  and increase midodrine   to 20 mg 3 times daily.  Has been getting a significant amount of IV fluid last few days, still likely volume depleted per nephrology.  9-14: Blood pressure remains stable, but soft. 9/15 hypotensive this morning.  Continue IVF and plan as above 9/16: A bit improved today; continue IVF, no signs of volume overload. If profoundly hypotensive again may need to transfer to acute given maximizing fluids/BP support without pressors.  9-18: DC IV fluid as above today; monitor blood pressures very closely, seem more stable than they were last week.  On chart review, was minimally responsive to trial of high-dose steroids in August; does not look like cosyntropin stim test was ever performed; Continue Florinef  for now, may need to consider if blood pressures do not improve 9-19: Blood pressure holding up in the low 100s, continue current regimen. 9/20-21 BP stable, continue to monitor  9/23: Stable -reduce midodrine  back to 15 mg 3 times daily--> 10 mg 3 times daily 9-24.  If tolerates well, will start reducing Florinef  next.  May need rate control agent although current tachycardia seems to be compensatory sinus tach 9-24: Continues to get tachycardic into the 150s with exertion, comes down to 90s to 110s with rest.  Discussed with cardiology, appreciate recommendations, clearly compensatory and should not treat.  Given poor p.o. fluid intakes, will resume IV normal saline at 70 cc/h for maintenance and really encourage p.o. fluids 9-26: Looking much better today.  Reduce maintenance fluids as above.  Monitor on reduced dose of Florinef  over the weekend, and if he does well would further reduce to 0.05 mg from Monday Continue reduced dose of florinef      08/08/2024    6:20 PM 08/08/2024    2:20 PM 08/08/2024    1:20 PM  Vitals with BMI  Systolic 124 114 867  Diastolic 74 63 80  Pulse 100 90 116     13. 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  -- resolved  9/1 held reglan   -9/16: resume  reglan  given low OP, nausea--with benefit, output remains low   - 9/18: Ostomy output picking up considerably with tube feeds; on fibercon BID; appreciate nutrition input--staying appropriate   - 9/23: Ostomy OP appropriate  14. Depression: On Zoloft -- Dced 9/13 due to hyponatremia and confusion   - 9/16: Family concerned for serotonin syndrome with zoloft +linezolid - no tachycardia, fevers, seizures, or severe agitation reported - encephalopathy has not improved with removal of zoloft . Low suspicion at this time.   15. Malnutrition/: Albumin  @1 .9/TP-5.1.  --> 2.2 9/11;  - 9-11: Adding Premeal Zofran  as above.  Will get nutrition to start calorie count; may need additional supplementation or discussion of feeding tube if does not improve.  - 9-12: Slightly improved, continue this regimen.  9-13: Very poor p.o. intakes yesterday.  DC Megace  due to possible cognitive effects, no perceived benefit.  Will discuss possible feeding tube with family--patient rejects, will get calorie count for 48 hours and if no improvement may readdress tube feed versus palliative consult  9-14: Wife encouraging p.o.'s, patient motivated to avoid feeding tube.  Calorie count started.  Maintenance fluids as above.  9/16: Qtc 519; DC zofran , resume relgan 5 mg TID AC as  he did well with this in the past and ostomy output has slowed considerably  9-17: QTc remains prolonged; will not resume Zofran  at this time.  Patient agreeable to placement of core track and discussed with nutrition starting with nocturnal tube feeds for supplementation, to allow appetite during the day to continue to work on increasing p.o.'s.  Will give one-time Xanax  to assist in anxiety with core track placement.  9-18: Tolerating tube feeds well, labs looking stable.  P.o. intakes poor this a.m. due to timing of therapy/medication/breakfast, continue to encourage as primary form of nutrition.  9-19: Tolerating overnight tube feeds well, p.o. intakes  during the day complicated by gagging/coughing due to tube feed placement.  Chest x-ray yesterday was negative.  Orders to maintain head of bed 30 degrees whether or not tube feeds are running, and viscous lidocaine  4 times daily as needed for sensation of gagging.  9/20 nausea noted today, patient did get  dose of Compazine  and is continued on Reglan .  Will check with pharmacy to see if any new medications could be contributing.Appears he was transition to nocturnal tube feeds may be rate a little high for him will decrease to 60 mL/h overnight.  Discussed with pharmacy he got a one-time dose of Oritivancin yesterday, he should not receive any more of this as this was a one-time order  -9-22- nausea improved with current regimen--helping considerably with p.o. intakes, may be able to DC tube feeds this week.  Dietary resumed calorie count today.  9/23: Nausea resolved; pending stable PO intakes may DC reglan  this week--DC 9-26   16. Anemia of chronic illness: Stable overall 9/8-11  - Hemoglobin stable 9-10  9-22:Hemoglobin from 9.7->8.6   No obvious sources of bleeding, repeat in AM.  9/23: HgB stable 8.3--> 8.0       Latest Ref Rng & Units 08/06/2024   12:33 AM 08/04/2024    4:54 AM 08/03/2024    3:51 AM  CBC  WBC 4.0 - 10.5 K/uL 7.0  6.0  6.4   Hemoglobin 13.0 - 17.0 g/dL 8.0  8.3  8.6   Hematocrit 39.0 - 52.0 % 25.6  25.9  27.8   Platelets 150 - 400 K/uL 132  72  71     17.  Thrombocytopenia.  Mild, 136 today.  Monitor very closely in setting of linezolid .   - 9/19: 104; per Dr. Dennise likely adjusting linezolid  to orvitatancin starting Monday.  Continue to trend  9-22: Remains downtrending, trend.  71 today.  May take a few days to bounce back from linezolid   9/23: PLT stabilized 72; monitor  9-25: Platelets starting to come back, 130s today.  Would be cautious to resume linezolid    18.  Hypophosphatemia.  1.3 to 3.1 with IV repletion, next day 1.6.  - 9-19: Per electrolyte protocol,  start Phos-Nak Powder via tube feed 2 packets 4 times daily for 5 doses  - Repeat daily.  -9/20 replete today, 15mmol IV,  discussed dose pharmacy  -9/21 improved to 1.9, continue replete per pharm rec   -22: 2.3, replete today, check in a.m.--repleted  19. Low MG  -9/20 replete today 2g IV,  discussed dose pharmacy  -9/21 improved today continue to monitor  Magnesium  reviewed and is 2   LOS: 28 days A FACE TO FACE EVALUATION WAS PERFORMED  Kristopher Thompson 08/08/2024, 8:02 PM

## 2024-08-08 NOTE — Progress Notes (Signed)
 Urostomy bag leaked family member changed bag. Charge Nurse updated about ordering new bags. Family in room explains bag was changed twice in the last 24 hours and only two bags left at this time.

## 2024-08-08 NOTE — Progress Notes (Signed)
 Physical Therapy Weekly Progress Note  Patient Details  Name: Kristopher Thompson MRN: [id number] Date of Birth: 1951-04-20  Beginning of progress report period: July 30, 2024 End of progress report period: August 08, 2024  Today's Date: 08/08/2024 PT Individual Time: 1102-1200 PT Individual Time Calculation (min): 58 min   Patient has met 0 of 0 short term goals. Pt did not have any short term goals set this reporting session due to LOS being extended. Pt, however, is making excellent progress toward long term goals and has demonstrated increased independence with bed mobility, balance, transfers, and ambulation. Pt continues to be limited by global deconditioning with associated strength and endurance deficits. Pt will benefit from hands on family education prior to DC.  Patient continues to demonstrate the following deficits muscle weakness, decreased cardiorespiratoy endurance, decreased awareness and decreased problem solving, and decreased sitting balance, decreased standing balance, decreased postural control, and decreased balance strategies and therefore will continue to benefit from skilled PT intervention to increase functional independence with mobility.  Patient progressing toward long term goals..  Continue plan of care.  PT Short Term Goals Week 3:  PT Short Term Goal 1 (Week 3): STGs = LTGs Week 4:  PT Short Term Goal 1 (Week 4): STGs = LTGs  Skilled Therapeutic Interventions/Progress Updates:     Pt received seated at EOB and agrees to therapy. No complaint of pain. Pt performs sit to stand from elevated bed with cues for initiation and hand placement. Pt ambulates to fatigue with RW, x130', with close WC follow and cues for upright posture to improve balance and body mechanics. Pt takes seated rest break. Pt then completes sit to stand from tilt in space WC with cues for hand placement and sequencing. Pt performs alternating foot taps on 3 step with RW, completing x20  with cues for posture and weight shifting, as well as increasing eccentric control of stand to sit for strengthening and safety. Following extended seated rest break, pt completes additional x20 with similar cueing. Seated rest break. Activity progressed by having pt perform step ups onto ~4 step with RW and CGA, with cues for step sequencing. Pt completes x3 with each leg prior to rest break. Pt completes additional bouts of x6 total step ups, x10, and x4. Extended seated rest breaks between each bout. Activity performed to work on endurance, strength, balance, and mobility training. Pt left seated in WC with all needs within reach.   Therapy Documentation Precautions:  Precautions Precautions: Fall (HOH, Urostomy, Colostomy, PCN  drain, and orthostatic hypotension.) Recall of Precautions/Restrictions: Impaired Precaution/Restrictions Comments: urostomy, colostomy, lt PCN drain, watch BP and HR Restrictions Weight Bearing Restrictions Per Provider Order: No   Therapy/Group: Individual Therapy  Elsie JAYSON Dawn, PT, DPT 08/08/2024, 3:41 PM

## 2024-08-09 LAB — C-REACTIVE PROTEIN: CRP: 0.8 mg/dL (ref <1.0)

## 2024-08-09 MED ORDER — FLUDROCORTISONE ACETATE 0.1 MG PO TABS
0.0500 mg | ORAL_TABLET | Freq: Every day | ORAL | Status: DC
Start: 2024-08-10 — End: 2024-08-12
  Administered 2024-08-10 – 2024-08-11 (×2): 0.05 mg via ORAL
  Filled 2024-08-09 (×2): qty 1

## 2024-08-09 NOTE — Plan of Care (Signed)
  Problem: Consults Goal: RH GENERAL PATIENT EDUCATION Description: See Patient Education module for education specifics. Outcome: Progressing   Problem: RH SAFETY Goal: RH STG ADHERE TO SAFETY PRECAUTIONS W/ASSISTANCE/DEVICE Description: STG Adhere to Safety Precautions With Assistance/Device. Outcome: Progressing   Problem: RH PAIN MANAGEMENT Goal: RH STG PAIN MANAGED AT OR BELOW PT'S PAIN GOAL Description: <4 w/ prns Outcome: Progressing   Problem: RH KNOWLEDGE DEFICIT GENERAL Goal: RH STG INCREASE KNOWLEDGE OF SELF CARE AFTER HOSPITALIZATION Description: Manage increase knowledge of self care care after hospitalization with supervision assistance from wife using educational materials provided Outcome: Not Progressing

## 2024-08-09 NOTE — Progress Notes (Signed)
 PROGRESS NOTE   Subjective/Complaints: No new complaints this morning Discussed with husband and wife that surgery feels he is healing well, CRP is 0.8, wife is doing great with ostomy care   ROS: Patient denies fever, rash, sore throat, blurred vision, dizziness,  , vomiting, diarrhea, cough, shortness of breath or chest pain, joint or back/neck pain, headache, or mood change.     Objective:   No results found.     No results for input(s): WBC, HGB, HCT, PLT in the last 72 hours.   Recent Labs    08/08/24 0341  NA 140  K 4.0  CL 112*  CO2 18*  GLUCOSE 91  BUN 21  CREATININE 1.21  CALCIUM  8.5*    Intake/Output Summary (Last 24 hours) at 08/09/2024 1257 Last data filed at 08/09/2024 1019 Gross per 24 hour  Intake 2007 ml  Output 2075 ml  Net -68 ml        Physical Exam: Vital Signs Blood pressure 111/63, pulse 76, temperature 97.9 F (36.6 C), temperature source Oral, resp. rate 18, height 5' 11 (1.803 m), weight 105 kg, SpO2 98%.   Constitutional: No acute distress.  Sitting up in bedside chair. Wife is performing patient's ostomy care HEENT: NCAT, EOMI, oral membranes moist.    Neck: supple, no JVD. Cardiovascular: RRR without murmur.  1+ bilateral upper extremity and lower extremity edema. Respiratory/Chest: CTA Bilaterally without wheezes or rales. Normal efforrt.  GI/Abdomen: soft, non-tender, non-distended.  Positive bowel sounds ostomy with liquid brown stool--mild amount. Ext: no clubbing, cyanosis. Psych: Appropriate mood and affect.  Now joking. PRIOR EXAMS: Skin:  abdominal wound  with pink with granulation tissue, moist, covered in clean dressing. --Unchanged Left nephrostomy tube with bag seal in place. --Unchanged R flank draining serosanguineous fluid, increased over the last 2 days no obvious thick or white/green discharge.-26 Urostomy with some sediment, mostly clear output.  --Unchanged Ileostomy well-circumscribed, underlying pink healthy tissue  Neuro:   AAO.  [name], hospital, and time. Attention much improved from last week, can answer complex questions with mild deficits.  Complicated by poor hearing.  Normal language and speech.  Cranial nerve exam unremarkable.  MMT: BUE 4/5 prox to distal. BLE 4-/5 HF, 4/5 KE and 4/5 ADF/PF-improving No limb ataxia or cerebellar signs.  Bilateral upper and lower extremity intention tremors--none apparent today no abnormal tone appreciated.   Musculoskeletal: Able to straighten both knees out on bed.  Plantarflexion tone bilateral lower extremities, barely able to range to neutral - unchanged   Assessment/Plan: 1. Functional deficits which require 3+ hours per day of interdisciplinary therapy in a comprehensive inpatient rehab setting. Physiatrist is providing close team supervision and 24 hour management of active medical problems listed below. Physiatrist and rehab team continue to assess barriers to discharge/monitor patient progress toward functional and medical goals  Care Tool:  Bathing    Body parts bathed by patient: Chest, Face, Abdomen, Right arm, Left arm, Front perineal area, Right upper leg, Left upper leg   Body parts bathed by helper: Buttocks, Right lower leg, Left lower leg     Bathing assist Assist Level: Moderate Assistance - Patient 50 - 74%  Upper Body Dressing/Undressing Upper body dressing   What is the patient wearing?: Hospital gown only    Upper body assist Assist Level: Moderate Assistance - Patient 50 - 74%    Lower Body Dressing/Undressing Lower body dressing      What is the patient wearing?: Pants     Lower body assist Assist for lower body dressing: Maximal Assistance - Patient 25 - 49%     Toileting Toileting Toileting Activity did not occur (Clothing management and hygiene only): N/A (no void or bm)  Toileting assist Assist for toileting: Dependent -  Patient 0%     Transfers Chair/bed transfer  Transfers assist  Chair/bed transfer activity did not occur: Safety/medical concerns  Chair/bed transfer assist level: Moderate Assistance - Patient 50 - 74%     Locomotion Ambulation   Ambulation assist   Ambulation activity did not occur: Safety/medical concerns (fatigue/dizziness/orthostatics)          Walk 10 feet activity   Assist  Walk 10 feet activity did not occur: Safety/medical concerns (fatigue/dizziness/orthostatics)        Walk 50 feet activity   Assist Walk 50 feet with 2 turns activity did not occur: Safety/medical concerns (fatigue/dizziness/orthostatics)         Walk 150 feet activity   Assist Walk 150 feet activity did not occur: Safety/medical concerns (fatigue/dizziness/orthostatics)         Walk 10 feet on uneven surface  activity   Assist Walk 10 feet on uneven surfaces activity did not occur: Safety/medical concerns (fatigue/dizziness/orthostatics)         Wheelchair     Assist Is the patient using a wheelchair?: Yes Type of Wheelchair: Manual           Wheelchair 50 feet with 2 turns activity    Assist    Wheelchair 50 feet with 2 turns activity did not occur: Safety/medical concerns       Wheelchair 150 feet activity     Assist  Wheelchair 150 feet activity did not occur: Safety/medical concerns       Blood pressure 111/63, pulse 76, temperature 97.9 F (36.6 C), temperature source Oral, resp. rate 18, height 5' 11 (1.803 m), weight 105 kg, SpO2 98%. Medical Problem List and Plan: 1. 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  Undergoing medical workups as below, will monitor  improvement for the next couple of days --address at teams if current level of care exceeds ability of home caregivers.  - 9-13: Bilateral lower extremity PRAFO's in bed for plantarflexion/tight heel cords  --continue CIR therapies including PT, OT, and SLP. Progress has been very slow. May need to consider other discharge venue   - 9/16: Declining with therapies; pleasantly confused today, interactions much less withdrawn but nonsensical. Will need Mod-Max A at home. Concern for physical decline exceeding family ability for caregiving if no improvements moving forward.   - 9-17 waiting on calorie counts today to determine if feeding tube needed; if patient/family continue to refuse, would recommend goals of care discussion and possible palliative care consult--patient opted for placement of tube feed  - 9-18: Due to ongoing significant support needed to  maintain blood pressure, as well as chills today, discussed additional workup/treatment with infectious disease; recommend against treatment of yeast at this time given likely contamination, given ongoing decline will get lactic acid and repeat CT abdomen and pelvis without contrast in a.m--lactic acid upper limit of normal 1.9, CT abdomen and pelvis results -right ventral pelvic wall fluid collection not changed in size from prior study, distal colonic diverticulosis, small bilateral pleural effusions and dependent lower lobe atelectasis, IR consulted yesterday but holding off on drainage of fluid collection  - . Repeat imaging performed yesterday and shows stable fluid collection in the abdominal wall. Patient without elevated WBC, remains afebrile, collection appears similar in size and nature. Per Dr. Jenna, no aspiration or drainage planned for now.    -  9/23: Wife continues to do all cares - doing walking trials with OT to work on endurance and transfers, remains Mod-Max A to stand but once on his feet can be Min A 46 feet. PT Mod-Max A sit to stand.  PO intakes much improved last 2 days.  Likely stedy; has home hospital bed without power lift so will check height for transfers.   - Family meeting today: Est Min-Mod A at home, Wife to work with patient closely over the next week to determine if able to handle needs at home.     2.  H/o PE/Antithrombotics: -DVT/anticoagulation:  Pharmaceutical: continue Eliquis              -antiplatelet therapy: N/A  - No further blood appreciated in nephrostomy tubes  3. Pain Management: Oxycodone  prn.    - 9/16: Oxycodone  was removed yesterday due to concerns of this contributing to PM confusion and hypotension; nursing reports family in disagreement with this and patient pain increased. Adjust to Tylenol  PRN for mild to moderate, Percocet 5 mg for severe pain to avoid undertreatment. Monitor BP VERY closely  9-18: Has not required Percocet again since 9-15  9/21 patient reports pain is overall under control  4. Mood/Behavior/Sleep: LCSW to follow for evaluation and support.              --melatonin for insomnia.              -antipsychotic agents: N/A  9-14: Per patient, sleeping well.  Sleep log.  Discussed delirium management with patient's wife  - 9-18 sleep log remains appropriate, no apparent hallucinations or delusions.  Continue current management  5. Neuropsych/cognition/encephalopathy likely secondary to metabolic acidosis vs delirium from prolonged illness/hospitalization: This patient is not capable of making decisions on his own behalf.  - 9-11: Increasingly withdrawn, behavioral changes may be secondary to poor nutritional intake and hyponatremia.  Treating as below, on sertraline  100 mg daily, may need to add additional agent such as mirtazapine but hesitant given ongoing cognitive issues.  9-12: Slightly better today, but consistent with hypoactive delirium with primary difficulty with concentration, no focal neurologic deficits.  Start overnight sleep log, delirium precautions.  Suspect  hyponatremia as primary contributor, management as below.  9/13: D3C Flexeril , Megace , Zoloft  due to ongoing cognitive changes.  Sodium is improving.  Get LFTs, ammonia today to evaluate ongoing cognitive deficits--within normal limits.  -14: Cognition seems to be clearing a bit, continue current regimen.   9/15 alert but delayed. Continue to maximize renal/volume/nutritional status 9/16: Much more alert, but delirious; sleep log appears appropriate. Will add Zyprexa  5 mg PRN for aggitation d/t increased aggitation yesterday afternoon. Keep low dose / low likelihood of Qtc prolongation medications only.  9-18: Cognition improving very gradually, QTc remains prolonged.  DC zyprexa  due to QTC   9-22: Doing much better this week.  Continue with adequate nutrition, hydration    6. Skin/Wound Care: Routine pressure relief measures. Encourage adequate diet             --dressing changes daily. Continue Gerhardt's cream to buttock wound.   -abdominal wound continues to close despite tenous nutritional status  7. Fluids/Electrolytes/Nutrition/oropharyngeal dysphagia/moderate to severe protein calorie malnutrition:                --9/10 periactin  yesterday stopped d/t lethargy   -will try megace  beginning today   -RD f/u   -push fluids   -recheck bmet in am   -continue juven bid. Has been refusing Ensure supplement.   -kdur 20meq daily   -can't use zofran  d/t QTc prolongation (490)  - MBS   9/3--D3/thins, prealbumin borderline normal at 20   - 9-11: Nausea remains a primary barrier to adequate p.o. intakes.  Recheck EKG today, QTc was 460, scheduled Zofran  4 mg IV 3 times daily AC.  Will repeat EKG in next 1 to 2 days.  - 9-12: Albumin  infusion per nephrology.  P.o. intakes improving with Zofran .  per IV team midline contraindicated due to bicarb.  May get PICC line once dialysis no longer considered.  9-14: P.o. intakes okay, Zofran  continues to help with nausea.  Per nephrology, okay to reduce  fluids, will keep on 70 cc/h normal saline for maintenance for now.  PICC line order placed.  9/15 intake 20-30%. Receiving mtc IVF.    -k+ 3.3, mg++ 1.3   -continue kdur 20meq daily and add 40meq to IVF   -IV mag sulfae 2g x1, add mag gluconate oral daily    9/16: Mag, K repleted. PO intakes remain very poor. Albumin  2.0. Continue maintenance fluids--will likely need TF.   9/17: Ad phosnak 1 packet x3 doses for repletion of K and Phos; repeat in AM--nephrology changed to IV--repleted 9-18  9-22: Nutrition doing much better, eating 50 to 100% of meals.  Replete electrolytes as below. 9/23: Finishing calorie count per SLP today - PO intakes MUCH better - hopeful to remove cortrak tube otherwise will get IR consult to evaluate if PEG possible 9-24: Dietary stating patient is to keep up with 50% of his energy needs, can consider removing core track tube.  Will wait for labs in a.m. to ensure nutrition and hydration continue to be adequate before removing; did just DC free water  flushes and IV fluids 9-25: Core track DC'd, patient labs look okay.  Really encourage p.o. fluids, resuming IV maintenance as below due to tachycardia but should really push p.o. intakes this week. 9-26: P.o. intakes really good, bring maintenance fluids down to 40 cc/h, BMP and Mag in AM  8. Sepsis/Abdominal wall/mesh infection: IV Vanc/Ceftriaxone  for 6 weeks from 08/21 (10/2)             --Probable 6 months total of antibiotics  --Corynebacterium species-->6 more weeks ?Dalbavancin for OP use, cont ceftriazone and linezolid  for now  per ID -appreciate recent ID f/u CRP nl 0.8 ESR 44 WBC 8.2 9-11: WBC labs remained stable. 9/15 wbc 7.1 9/16: Discussed with Dr. Dennise ID possibility of encephalopathy with current antibiotics, as well as +UA yesterday. Recommendation to continue current broad spectrum antibiotics as they are unlikely contributors and regimen has already been altered from vancomycin . Given some yeast in UA  will await culture to see if secondary  fungal infection present vs contaminant. -pending 9/17 9/18; -LA 1.9, CT abdomen and pelvis results  --see above 9-22: Per Dr Dennise,  then continue oritavancin  and ceftriaxone  till 10/2.  After which can do 6 more weeks on omdada+dalvance/tedezolid.  Continue Rocephin .-- will double check with ID on this timeline 9/23 9-24: Infectious disease, pharmacy working with family today on discharge antibiotic regimen from a cost intolerance perspective; I agree with trialing his home antibiotic regimen in the hospital to ensure that nausea does not limit him again 9-25: Extensive discussions with infectious disease Dr. Overton and Dr. Sheldon today; feel mesh infection is less likely, decision to remove antibiotics and monitor closely over the next week.  Patient will get CT abdomen close to discharge to reevaluate size of abscess.  CRPs every 72 hours and other labs for monitoring per ID.  Patient and family understanding and agreeable with this.  9. Left> right hydronephrosis: Nephrostomy tubes with stent exchanged on 08/27 --continue clamping right tube and left to drain per urology.  Monitor for blood recheck CBC in am --hemoglobin stable, no further bloody output appreciated -9/15 flush left nephrostomy tube 9-18: Prior right tube site continues to drain, CT abdomen and pelvis as above  10. Acute on chronic renal failure/hyponatremia:  elevated CR- baseline creat probably ~1.4  -9/10 will give addnl IVF today d/t poor po intake   -recheck bmet in am  - 9-11: AKI significantly worse, now with hyponatremia 131.  Will give 1 L fluid bolus normal saline today, recheck BMP; given brisk uptrend despite increase in IV fluids will consult nephrology for further recommendations.  - 9-12: Appreciate nephrology input and recommendations.  Responding slightly to increased IV fluids overnight, normal saline 125/h.  Hyponatremia remains 131. On multiple medications that could be  contributing including Florinef , sertraline , and linezolid .  See their note for specific recommendations; advised to remain on fluids for primary treatment of hyponatremia, do not recommend salt tabs at this time.  - If renal function does not improve with fluids may need CT scan of the abdomen and pelvis to verify patency of nephrostomy/ileal conduit drainage   9-13: Potassium chloride  packet 40 mill equivalents ordered this a.m. for repletion.  Sodium improving, bicarb improving, but renal function remains unchanged on bicarb drip.  9-14: Renal function improved, acidosis resolved, sodium 134.  Per nephrology, can DC fluids, but agreeable to maintenance fluids given ongoing poor p.o. intakes.  Continue to monitor closely.  9/15-17 Cr continues to improve gradually, continue IVF, push po  9/18: On TF, DC IVF, move to FWF 200 ccs QID--monitor closely  9-19: Seems to be tolerating free water  flushes well, continuing to improve.  Continue through this weekend.     9/21 creatinine down to 1.29/BUN 40 continue current regimen and monitor  9-22: BUN 35, creatinine 1.19.  9/23: ok to check renal function 2x weekly, now GFR >60. DC FWF, encourage PO. --Labs in a.m.  9-25: Renal function stable, however resuming some IV fluids as below.  9/26: Encourage p.o., reducing IV fluids to 40 cc/h.  BMP tomorrow--can come off of IV fluids over this weekend if p.o. fluid intakes are adequate  11. OSA: Encourage CPAP use 12. Hypotension/tachycardia/?adrenal insufficiency: Continue Midodrine  15 mg TID and Florinef  2 mg daily. Plasma cortisol 13.6 6/25/             --continues to have significant tachycardia with activity likely due to orthostasis and deconditioning.  - -hgb 11.5   -continue metoprolol  low dose 12.5mg   bid, bp remains soft but stable at present   -  IVF as above Per PT- has been not dropping in therapy   9-13: Orthostatic today, DC Lopressor  and increase midodrine  to 20 mg 3 times daily.  Has been  getting a significant amount of IV fluid last few days, still likely volume depleted per nephrology.  9-14: Blood pressure remains stable, but soft. 9/15 hypotensive this morning.  Continue IVF and plan as above 9/16: A bit improved today; continue IVF, no signs of volume overload. If profoundly hypotensive again may need to transfer to acute given maximizing fluids/BP support without pressors.  9-18: DC IV fluid as above today; monitor blood pressures very closely, seem more stable than they were last week.  On chart review, was minimally responsive to trial of high-dose steroids in August; does not look like cosyntropin stim test was ever performed; Continue Florinef  for now, may need to consider if blood pressures do not improve 9-19: Blood pressure holding up in the low 100s, continue current regimen. 9/20-21 BP stable, continue to monitor  9/23: Stable -reduce midodrine  back to 15 mg 3 times daily--> 10 mg 3 times daily 9-24.  If tolerates well, will start reducing Florinef  next.  May need rate control agent although current tachycardia seems to be compensatory sinus tach 9-24: Continues to get tachycardic into the 150s with exertion, comes down to 90s to 110s with rest.  Discussed with cardiology, appreciate recommendations, clearly compensatory and should not treat.  Given poor p.o. fluid intakes, will resume IV normal saline at 70 cc/h for maintenance and really encourage p.o. fluids 9-26: Looking much better today.  Reduce maintenance fluids as above.  Monitor on reduced dose of Florinef  over the weekend, and if he does well would further reduce to 0.05 mg from Monday Continue reduced dose of florinef      08/09/2024    5:00 AM 08/09/2024    4:43 AM 08/08/2024    8:25 PM  Vitals with BMI  Weight 231 lbs 8 oz    BMI 32.3    Systolic  111 120  Diastolic  63 64  Pulse  76 93     13. 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  -- resolved  9/1 held reglan   -9/16: resume  reglan  given low OP, nausea--with benefit, output remains low   - 9/18: Ostomy output picking up considerably with tube feeds; on fibercon BID; appreciate nutrition input--staying appropriate   - 9/23: Ostomy OP appropriate  14. Depression: On Zoloft -- Dced 9/13 due to hyponatremia and confusion   - 9/16: Family concerned for serotonin syndrome with zoloft +linezolid - no tachycardia, fevers, seizures, or severe agitation reported - encephalopathy has not improved with removal of zoloft . Low suspicion at this time.   15. Malnutrition/: Albumin  @1 .9/TP-5.1.  --> 2.2 9/11;  - 9-11: Adding Premeal Zofran  as above.  Will get nutrition to start calorie count; may need additional supplementation or discussion of feeding tube if does not improve.  - 9-12: Slightly improved, continue this regimen.  9-13: Very poor p.o. intakes yesterday.  DC Megace  due to possible cognitive effects, no perceived benefit.  Will discuss possible feeding tube with family--patient rejects, will get calorie count for 48 hours and if no improvement may readdress tube feed versus palliative consult  9-14: Wife encouraging p.o.'s, patient motivated to avoid feeding tube.  Calorie count started.  Maintenance fluids as above.  9/16: Qtc 519; DC zofran , resume relgan 5 mg TID AC as he did well  with this in the past and ostomy output has slowed considerably  9-17: QTc remains prolonged; will not resume Zofran  at this time.  Patient agreeable to placement of core track and discussed with nutrition starting with nocturnal tube feeds for supplementation, to allow appetite during the day to continue to work on increasing p.o.'s.  Will give one-time Xanax  to assist in anxiety with core track placement.  9-18: Tolerating tube feeds well, labs looking stable.  P.o. intakes poor this a.m. due to timing of therapy/medication/breakfast, continue to encourage as primary form of nutrition.  9-19: Tolerating overnight tube feeds well, p.o. intakes  during the day complicated by gagging/coughing due to tube feed placement.  Chest x-ray yesterday was negative.  Orders to maintain head of bed 30 degrees whether or not tube feeds are running, and viscous lidocaine  4 times daily as needed for sensation of gagging.  9/20 nausea noted today, patient did get  dose of Compazine  and is continued on Reglan .  Will check with pharmacy to see if any new medications could be contributing.Appears he was transition to nocturnal tube feeds may be rate a little high for him will decrease to 60 mL/h overnight.  Discussed with pharmacy he got a one-time dose of Oritivancin yesterday, he should not receive any more of this as this was a one-time order  -9-22- nausea improved with current regimen--helping considerably with p.o. intakes, may be able to DC tube feeds this week.  Dietary resumed calorie count today.  9/23: Nausea resolved; pending stable PO intakes may DC reglan  this week--DC 9-26   16. Anemia of chronic illness: Stable overall 9/8-11  - Hemoglobin stable 9-10  9-22:Hemoglobin from 9.7->8.6   No obvious sources of bleeding, repeat in AM.  9/23: HgB stable 8.3--> 8.0       Latest Ref Rng & Units 08/06/2024   12:33 AM 08/04/2024    4:54 AM 08/03/2024    3:51 AM  CBC  WBC 4.0 - 10.5 K/uL 7.0  6.0  6.4   Hemoglobin 13.0 - 17.0 g/dL 8.0  8.3  8.6   Hematocrit 39.0 - 52.0 % 25.6  25.9  27.8   Platelets 150 - 400 K/uL 132  72  71     17.  Thrombocytopenia.  Mild, 136 today.  Monitor very closely in setting of linezolid .   - 9/19: 104; per Dr. Dennise likely adjusting linezolid  to orvitatancin starting Monday.  Continue to trend  9-22: Remains downtrending, trend.  71 today.  May take a few days to bounce back from linezolid   9/23: PLT stabilized 72; monitor  9-25: Platelets starting to come back, 130s today.  Would be cautious to resume linezolid    18.  Hypophosphatemia.  1.3 to 3.1 with IV repletion, next day 1.6.  - 9-19: Per electrolyte protocol,  start Phos-Nak Powder via tube feed 2 packets 4 times daily for 5 doses  - Repeat daily.  -9/20 replete today, 15mmol IV,  discussed dose pharmacy  -9/21 improved to 1.9, continue replete per pharm rec   -22: 2.3, replete today, check in a.m.--repleted  19. Low MG  -9/20 replete today 2g IV,  discussed dose pharmacy  -9/21 improved today continue to monitor  Magnesium  reviewed and is 2   LOS: 29 days A FACE TO FACE EVALUATION WAS PERFORMED  Sven SQUIBB Danika Kluender 08/09/2024, 12:58 PM

## 2024-08-09 NOTE — Progress Notes (Signed)
 08/09/2024  Kristopher Thompson [id number] 01-25-1951  CARE TEAM: PCP: [name], MD  Outpatient Care Team: Patient Care Team: [name], MD as PCP - General (Internal Medicine) [name], MD as Consulting Physician (Dermatology) [name], MD as Referring Physician (Pulmonary Disease) [name], MD as Consulting Physician (General Surgery) [name], MD as Consulting Physician (Urology)  Inpatient Treatment Team: Treatment Team:  Emeline Joesph BROCKS, DO Sula Eulalio MATSU, RN Nicholaus Nena DEL, OT [name], MD Fleeta Dixie, [place], Student-OT Dasie Elsie BROCKS, PT [place], Graeme LABOR, LCSW Waldemar Anes, Student-PT Dreama Bernardino SAUNDERS, RN [place], Doyce CROME, NT [place], [place], RN [place], [place], [place]   Problem List:   Principal Problem:   Critical illness myopathy Active Problems:   Cognitive and behavioral changes   Malnutrition of moderate degree   04/10/2024  POST-OPERATIVE DIAGNOSIS:  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   PROCEDURE:   -ROBOTIC LYSIS OF ADHESIONS X 4 HOURS -COMPONENT SEPARATION - TRANSVERSUS ABDOMINIS REALEASE (TAR) BILATERAL -ROBOTIC REPAIRS OF  INCISIONAL, PARASTOMAL, LEFT INGUINAL  INCARCERATED ABDOMINAL WALL HERNIAS WITH MESH -UROSTOMY ILEAL CONDUIT REVISION -INTRAOPERATIVE ASSESSMENT OF TISSUE VASCULAR PERFUSION USING ICG (indocyanine green ) -IMMUNOFLUORESCENCE -TRANSVERSUS ABDOMINIS PLANE (TAP) BLOCK - BILATERAL    SURGEON:  [name], MD   04/20/2024 POST-OPERATIVE DIAGNOSIS:  perforated bowel   PROCEDURE:  Drainage of right abdominal wall abscess as well as intra-abdominal abscess Explantation of abdominal mesh Small bowel resection (in discontinuity) Placement of ABThera wound VAC   SURGEON:  [name], MD  04/21/2024  POST-OPERATIVE DIAGNOSIS:  DELAYED BOWEL PERFORATION WITH OPEN ABDOMEN   PROCEDURE:   LYSIS OF ADHESIONS X ABDOMINAL WALL DEBRIDEMENT ILEAL  RESECTION END ILEOSTOMY ABDOMINAL WALL PARASTOMAL & INCISIONAL HERNIA REPAIRS WITH PHASIX MESH FASCIA CLOSURE WITH WOUND VAC PLACEMENT IN SQ   SURGEON:  [name], MD    05/04/2024 Interventional Radiology Procedure Note   Procedure: Bilateral percutaneous nephrostomy tube placements   Findings: Please refer to procedural dictation for full description. 10 Fr bilateral to bag drainage.   Complications: None immediate   Estimated Blood Loss: < 5 ml   Recommendations: Keep to bag drainage.  IR will follow.  Ultimate management as per Urology.   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    Plan:  Delayed urine leak - RESOLVED:  Nephrostomy tubes & stenting per [name] [facility].    I think given the very deconditioned state, they are going slowly  Tentative plan for close outpatient follow-up to see if right nephrostomy tube can be removed or if left side needs to be exchanged and/or capped.  Infection due to delayed bowel perforation and abdominal wall infection/necrosis  Had numerous discussions with infectious disease.  Only remaining area was a an abdominal wall sinus tract that seem very superficial to my bedside examination and not probing down to the mesh.    Doing a trial off all antibiotics for now.  On examination today that the sinus tract is nearly closed with a dot of granulation tissue only.  Skin and subcutaneous tissues already improved.  Given improvement off antibiotics.  That is reassuring.  Nutrition:  I think this is the biggest issue.  His  albumin  is less than 2 which is  limiting his ability to recover.  Prealbumin up to 20 though which is guardedly hopeful  Patient wife claims he is eating better now.  Appetite better off all antibiotics for now.  If he does not eat very well this week, I would recommend gastrostomy tube and get full nutrition that way I am guardedly optimistic he does not need that this week but we will see since he has had good weeks and bad weeks.  Until his albumin  is above 3, it will hard for him to recover or fend off chronic infection/chronic wounds.  Midline incision wound     Wound very superficial now with good granulation.  It is shrunk down in diameter. I would just do dry dressing and stop doing saline.  Metabolic encephalopathy resolved.  Follow.  Insomnia improved  ABLA on top of anemia of chronic disease nearly normal at this point.  Hold off on extra supplementation     -monitor electrolytes & replace as needed.  Keep K>4, Mg>2, Phos>3.    -Diabetes.  Sliding scale insulin .    -VTE prophylaxis-  Full anticoagulation given history of pulmonary embolism.  Eliquis .  -Mobilize as tolerated.  He remains very deconditioned and low motivation to exercise.  Hopefully therapies can work with him more to get him stronger.  Wife strongly agrees  I updated the patient's status to the the patient's wife and nurse.  Recommendations were made.  Questions were answered.  They expressed understanding & appreciation.  -Disposition:    I reviewed nursing notes, last 24 h vitals and pain scores, last 48 h intake and output, last 24 h labs and trends, and last 24 h imaging results.  I have reviewed this patient's available data, including medical history, events of note, test results, etc as part of my evaluation.   A significant portion of that time was spent in counseling. Care during the described time interval was provided by me.  This care required high  level of medical decision making.  08/09/2024    Subjective: (Chief  complaint)   Patient laying in bed.  Wife in room.  Claims he is eating better.  No nausea or vomiting.  All antibiotics stopped a few days ago.  Drainage from flank old drain site the first day after probing but seems to have dried up and much less now.   Objective:  Vital signs:  Vitals:   08/08/24 1820 08/08/24 2025 08/09/24 0443 08/09/24 0500  BP: 124/74 120/64 111/63   Pulse: 100 93 76   Resp: 16 17 18    Temp: 98.3 F (36.8 C) 98.6 F (37 C) 97.9 F (36.6 C)   TempSrc: Oral Oral Oral   SpO2: 100% 100% 98%   Weight:    105 kg  Height:        Last BM Date : 08/08/24  Intake/Output   Yesterday:  09/27 0701 - 09/28 0700 In: 2145 [P.O.:2115; I.V.:30] Out: 1525 [Urine:725; Stool:800] This shift:  No intake/output data recorded.  Bowel function:  Flatus: YES  BM:  YES -oatmeal consistency effluent in right upper quadrant end ileostomy bag  Drains: No surgical drain R back nephrostomy tube. Left nephrostomy tubes in place.  -Light-colored clear yellow urine in both left nephrostomy tube and ileal conduit   Physical Exam:  General: Resting in no acute distress.  More alert and interactive.  Oriented x 4 Eyes: Glasses PERRL, normal EOM.  Sclera clear.  No icterus Neuro: CN  II-XII intact w/o focal sensory/motor deficits. Lymph: No head/neck/groin lymphadenopathy Psych:  No delerium/psychosis/paranoia.  No agitation HENT: Normocephalic, Mucus membranes moist.  No thrush.  Moderately hard of hearing Neck: Supple, No tracheal deviation.  No obvious thyromegaly Chest: No pain to chest wall compression.  Good respiratory excursion.   CV:  Pulses intact.  Regular rhythm.  No extremity edema MS:  No obvious deformity  Abdomen:  Obese Soft.  Nondistended.  No tenderness nor guarding. Midline abd wound 8 x 2-4cm wide.  superifical w good granulation Right upper quadrant Ileostomy with thin effluent Left nephrostomy tube into biliary bag with moderate urine  Right  lower quadrant panniculus with drain site pinhole scar.  1 daughter granulation.  No opening sinus tract anymore.  No ecchymosis or cellulitis.  Skin and soft tissue improved.  1.serosanguineous fluid on dressing from yesterday.   Ext:   No deformity.  No edema.  No cyanosis Skin: No petechiae / purpurea.  No major sores.  Warm and dry    Results:   Cultures: Recent Results (from the past 720 hours)  Urine Culture     Status: Abnormal   Collection Time: 07/27/24 11:56 PM   Specimen: Urine, Random  Result Value Ref Range Status   Specimen Description URINE, RANDOM  Final   Special Requests   Final    NONE Reflexed from [id number] Performed at [facility], [address], [place], [place] [address]    Culture >=100,000 COLONIES/mL YEAST (A)  Final   Report Status 07/29/2024 FINAL  Final     Labs: Results for orders placed or performed during the hospital encounter of 07/11/24 (from the past 48 hours)  Basic metabolic panel     Status: Abnormal   Collection Time: 08/08/24  3:41 AM  Result Value Ref Range   Sodium 140 135 - 145 mmol/L   Potassium 4.0 3.5 - 5.1 mmol/L   Chloride 112 (H) 98 - 111 mmol/L   CO2 18 (L) 22 - 32 mmol/L   Glucose, Bld 91 70 - 99 mg/dL    Comment: Glucose reference range applies only to samples taken after fasting for at least 8 hours.   BUN 21 8 - 23 mg/dL   Creatinine, Ser 8.78 0.61 - 1.24 mg/dL   Calcium  8.5 (L) 8.9 - 10.3 mg/dL   GFR, Estimated >39 >39 mL/min    Comment: (NOTE) Calculated using the CKD-EPI Creatinine Equation (2021)    Anion gap 10 5 - 15    Comment: Performed at [facility], [address], [place], [place] [address]  Magnesium      Status: None   Collection Time: 08/08/24  3:41 AM  Result Value Ref Range   Magnesium  2.0 1.7 - 2.4 mg/dL    Comment: Performed at [facility], [address] [place], [place] [address]  C-reactive protein     Status: None   Collection Time: 08/09/24  4:29 AM  Result Value Ref  Range   CRP 0.8 <1.0 mg/dL    Comment: Performed at [facility], [address] [place], [place] [address]    Imaging / Studies: No results found.         Medications / Allergies: per chart  Antibiotics: Anti-infectives (From admission, onward)    Start     Dose/Rate Route Frequency Ordered Stop   08/07/24 1200  Omadacycline  Tosylate 100 mg in sodium chloride  0.9 % 100 mL IVPB  Status:  Discontinued  Placed in Followed by Linked Group   100 mg 200 mL/hr over 30 Minutes Intravenous Daily 08/05/24 1516 08/06/24 0926   08/06/24 1400  Omadacycline  Tosylate 100 mg in sodium chloride  0.9 % 100 mL IVPB  Status:  Discontinued       Placed in Followed by Linked Group   100 mg 200 mL/hr over 30 Minutes Intravenous Daily 08/05/24 1404 08/05/24 1516   08/06/24 1200  Omadacycline  Tosylate 200 mg in sodium chloride  0.9 % 100 mL IVPB  Status:  Discontinued       Placed in Followed by Linked Group   200 mg 100 mL/hr over 60 Minutes Intravenous Once 08/05/24 1516 08/06/24 0926   08/05/24 2200  cefTRIAXone  (ROCEPHIN ) 2 g in sodium chloride  0.9 % 100 mL IVPB        2 g 200 mL/hr over 30 Minutes Intravenous Every 24 hours 08/05/24 1516 08/05/24 2208   08/05/24 1600  Omadacycline  Tosylate 200 mg in sodium chloride  0.9 % 100 mL IVPB  Status:  Discontinued       Placed in Followed by Linked Group   200 mg 100 mL/hr over 60 Minutes Intravenous Once 08/05/24 1404 08/05/24 1516   07/31/24 1345  Oritavancin  Diphosphate (ORBACTIV ) 1,200 mg in dextrose  5 % IVPB        1,200 mg 333.3 mL/hr over 180 Minutes Intravenous Once 07/31/24 1252 07/31/24 1908   07/21/24 1745  linezolid  (ZYVOX ) IVPB 600 mg  Status:  Discontinued        600 mg 300 mL/hr over 60 Minutes Intravenous Every 12 hours 07/21/24 1653 07/31/24 1252   07/21/24 1615  linezolid  (ZYVOX ) tablet 600 mg  Status:  Discontinued       Note to Pharmacy: Patient unable to take this am dose and having hard time swallowing this  pill   600 mg Oral Every 12 hours 07/21/24 1522 07/21/24 1653   07/18/24 2000  linezolid  (ZYVOX ) tablet 600 mg  Status:  Discontinued        600 mg Oral Every 12 hours 07/18/24 1355 07/21/24 1522   07/17/24 1359  vancomycin  variable dose per unstable renal function (pharmacist dosing)  Status:  Discontinued         Does not apply See admin instructions 07/17/24 1403 07/18/24 1356   07/15/24 1015  vancomycin  (VANCOCIN ) 1,000 mg in sodium chloride  0.9 % 250 mL IVPB  Status:  Discontinued        1,000 mg 250 mL/hr over 60 Minutes Intravenous Every 48 hours 07/13/24 1504 07/17/24 1403   07/13/24 1000  vancomycin  (VANCOREADY) IVPB 1250 mg/250 mL  Status:  Discontinued        1,250 mg 166.7 mL/hr over 90 Minutes Intravenous Every 48 hours 07/11/24 1349 07/13/24 0832   07/13/24 1000  vancomycin  (VANCOCIN ) IVPB 1000 mg/200 mL premix  Status:  Discontinued        1,000 mg 200 mL/hr over 60 Minutes Intravenous Every 48 hours 07/13/24 0832 07/13/24 1504   07/12/24 0000  cefTRIAXone  (ROCEPHIN ) 2 g in sodium chloride  0.9 % 100 mL IVPB  Status:  Discontinued        2 g 200 mL/hr over 30 Minutes Intravenous Every 24 hours 07/11/24 1259 08/05/24 1404         Note: Portions of this report may have been transcribed using voice recognition software. Every effort was made to ensure accuracy; however, inadvertent computerized transcription errors may be present.   Any transcriptional errors that result from this process are  unintentional.    Elspeth KYM Schultze, MD, FACS, MASCRS Esophageal, Gastrointestinal & Colorectal Surgery Robotic and Minimally Invasive Surgery  [facility] [address] [place], [place] [address] [phone] Fax [phone] Main  CONTACT INFORMATION: Weekday (9AM-5PM): Call [facility] main office at [phone] Weeknight (5PM-9AM) or Weekend/Holiday: Check EPIC Web Links tab & use AMION (password  [email or web address]) for  General Surgery [facility] coverage  Please, DO NOT use SecureChat  (it is not reliable communication to reach operating surgeons & will lead to a delay in care).   Epic staff messaging available for outptient concerns needing 1-2 business day response.      08/09/2024  8:13 AM

## 2024-08-10 LAB — CBC
HCT: 27.1 % — ABNORMAL LOW (ref 39.0–52.0)
Hemoglobin: 8.6 g/dL — ABNORMAL LOW (ref 13.0–17.0)
MCH: 26.9 pg (ref 26.0–34.0)
MCHC: 31.7 g/dL (ref 30.0–36.0)
MCV: 84.7 fL (ref 80.0–100.0)
Platelets: 321 K/uL (ref 150–400)
RBC: 3.2 MIL/uL — ABNORMAL LOW (ref 4.22–5.81)
RDW: 15.8 % — ABNORMAL HIGH (ref 11.5–15.5)
WBC: 6.6 K/uL (ref 4.0–10.5)
nRBC: 0.3 % — ABNORMAL HIGH (ref 0.0–0.2)

## 2024-08-10 LAB — MAGNESIUM: Magnesium: 1.7 mg/dL (ref 1.7–2.4)

## 2024-08-10 LAB — BASIC METABOLIC PANEL WITH GFR
Anion gap: 10 (ref 5–15)
BUN: 21 mg/dL (ref 8–23)
CO2: 17 mmol/L — ABNORMAL LOW (ref 22–32)
Calcium: 8.7 mg/dL — ABNORMAL LOW (ref 8.9–10.3)
Chloride: 114 mmol/L — ABNORMAL HIGH (ref 98–111)
Creatinine, Ser: 1.25 mg/dL — ABNORMAL HIGH (ref 0.61–1.24)
GFR, Estimated: >60 mL/min (ref >60)
Glucose, Bld: 103 mg/dL — ABNORMAL HIGH (ref 70–99)
Potassium: 4.1 mmol/L (ref 3.5–5.1)
Sodium: 141 mmol/L (ref 135–145)

## 2024-08-10 LAB — SEDIMENTATION RATE: Sed Rate: 55 mm/h — ABNORMAL HIGH (ref 0–16)

## 2024-08-10 LAB — PHOSPHORUS: Phosphorus: 4.6 mg/dL (ref 2.5–4.6)

## 2024-08-10 LAB — MRSA NEXT GEN BY PCR, NASAL: MRSA by PCR Next Gen: NOT DETECTED

## 2024-08-10 NOTE — Plan of Care (Signed)
  Problem: Consults Goal: RH GENERAL PATIENT EDUCATION Description: See Patient Education module for education specifics. Outcome: Progressing   Problem: RH SAFETY Goal: RH STG ADHERE TO SAFETY PRECAUTIONS W/ASSISTANCE/DEVICE Description: STG Adhere to Safety Precautions With Assistance/Device. Outcome: Progressing   Problem: RH PAIN MANAGEMENT Goal: RH STG PAIN MANAGED AT OR BELOW PT'S PAIN GOAL Description: <4 w/ prns Outcome: Progressing   Problem: RH KNOWLEDGE DEFICIT GENERAL Goal: RH STG INCREASE KNOWLEDGE OF SELF CARE AFTER HOSPITALIZATION Description: Manage increase knowledge of self care care after hospitalization with supervision assistance from wife using educational materials provided Outcome: Not Progressing

## 2024-08-10 NOTE — Progress Notes (Signed)
 PROGRESS NOTE   Subjective/Complaints: No events overnight.  Patient with no complaints today Vitals stable. remains afebrile, blood pressures holding up well.    08/10/2024    8:28 PM 08/10/2024    3:15 PM 08/10/2024    4:40 AM  Vitals with BMI  Systolic 117 125 886  Diastolic 67 75 67  Pulse 88 109 82    No results for input(s): GLUCAP in the last 72 hours.   P.o. intakes appropriate, 75% of meals plus supplements. Bladder per urostomy, output appropriate Ileostomy output appropriate per wife.  A.m. labs with mild increase in creatinine, 1.25, otherwise stable.  Platelets have normalized.  ROS: Patient denies fever, rash, sore throat, blurred vision, dizziness,  , vomiting, diarrhea, cough, shortness of breath or chest pain, joint or back/neck pain, headache, or mood change.     Objective:   No results found.     Recent Labs    08/10/24 0429  WBC 6.6  HGB 8.6*  HCT 27.1*  PLT 321     Recent Labs    08/08/24 0341 08/10/24 0429  NA 140 141  K 4.0 4.1  CL 112* 114*  CO2 18* 17*  GLUCOSE 91 103*  BUN 21 21  CREATININE 1.21 1.25*  CALCIUM  8.5* 8.7*    Intake/Output Summary (Last 24 hours) at 08/10/2024 2301 Last data filed at 08/10/2024 1906 Gross per 24 hour  Intake 476 ml  Output 1625 ml  Net -1149 ml        Physical Exam: Vital Signs Blood pressure 117/67, pulse 88, temperature 97.7 F (36.5 C), temperature source Oral, resp. rate 17, height 5' 11 (1.803 m), weight 105 kg, SpO2 100%.   Constitutional: No acute distress.  Laying in bed. HEENT: NCAT, EOMI, oral membranes moist.    Neck: supple, no JVD. Cardiovascular: RRR without murmur.  1+ bilateral upper extremity and lower extremity edema.--Improving Respiratory/Chest: CTA Bilaterally without wheezes or rales. Normal efforrt.  GI/Abdomen: soft, non-tender, non-distended.  Positive bowel sounds ostomy with liquid light brown  stool- Ext: no clubbing, cyanosis. Psych: Appropriate mood and affect.  Increasingly dynamic.  Appropriate  Skin:  abdominal wound  with pink with granulation tissue, moist, covered in clean dressing. --Unchanged Left nephrostomy tube with bag seal in place. --Unchanged R flank draining serosanguineous fluid--not examined, reported by nursing decreased drainage Urostomy with  mostly clear output. --Unchanged Ileostomy well-circumscribed, underlying pink healthy tissue--unchanged  Neuro:   AAO.  [name], hospital, and time. Attention much improved from last week, can answer complex questions with mild deficits.  Complicated by poor hearing.  Normal language and speech.  Cranial nerve exam unremarkable.  MMT: 4 out of 5 bilateral upper and lower extremities. No limb ataxia or cerebellar signs.  Bilateral upper and lower extremity tremors now rare.  Musculoskeletal: Able to straighten both knees out on bed.  Plantarflexion tone much improved.  Assessment/Plan: 1. Functional deficits which require 3+ hours per day of interdisciplinary therapy in a comprehensive inpatient rehab setting. Physiatrist is providing close team supervision and 24 hour management of active medical problems listed below. Physiatrist and rehab team continue to assess barriers to discharge/monitor patient progress toward functional  and medical goals  Care Tool:  Bathing    Body parts bathed by patient: Chest, Face, Abdomen, Right arm, Left arm, Front perineal area, Right upper leg, Left upper leg   Body parts bathed by helper: Buttocks, Right lower leg, Left lower leg     Bathing assist Assist Level: Moderate Assistance - Patient 50 - 74%     Upper Body Dressing/Undressing Upper body dressing   What is the patient wearing?: Hospital gown only    Upper body assist Assist Level: Moderate Assistance - Patient 50 - 74%    Lower Body Dressing/Undressing Lower body dressing      What is the  patient wearing?: Pants     Lower body assist Assist for lower body dressing: Maximal Assistance - Patient 25 - 49%     Toileting Toileting Toileting Activity did not occur (Clothing management and hygiene only): N/A (no void or bm)  Toileting assist Assist for toileting: Dependent - Patient 0%     Transfers Chair/bed transfer  Transfers assist  Chair/bed transfer activity did not occur: Safety/medical concerns  Chair/bed transfer assist level: Moderate Assistance - Patient 50 - 74%     Locomotion Ambulation   Ambulation assist   Ambulation activity did not occur: Safety/medical concerns (fatigue/dizziness/orthostatics)          Walk 10 feet activity   Assist  Walk 10 feet activity did not occur: Safety/medical concerns (fatigue/dizziness/orthostatics)        Walk 50 feet activity   Assist Walk 50 feet with 2 turns activity did not occur: Safety/medical concerns (fatigue/dizziness/orthostatics)         Walk 150 feet activity   Assist Walk 150 feet activity did not occur: Safety/medical concerns (fatigue/dizziness/orthostatics)         Walk 10 feet on uneven surface  activity   Assist Walk 10 feet on uneven surfaces activity did not occur: Safety/medical concerns (fatigue/dizziness/orthostatics)         Wheelchair     Assist Is the patient using a wheelchair?: Yes Type of Wheelchair: Manual           Wheelchair 50 feet with 2 turns activity    Assist    Wheelchair 50 feet with 2 turns activity did not occur: Safety/medical concerns       Wheelchair 150 feet activity     Assist  Wheelchair 150 feet activity did not occur: Safety/medical concerns       Blood pressure 117/67, pulse 88, temperature 97.7 F (36.5 C), temperature source Oral, resp. rate 17, height 5' 11 (1.803 m), weight 105 kg, SpO2 100%. Medical Problem List and Plan: 1. 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  Undergoing medical workups as below, will monitor improvement for the next couple of days --address at teams if current level of care exceeds ability of home caregivers.  - 9-13: Bilateral lower extremity PRAFO's in bed for plantarflexion/tight heel cords  --continue CIR therapies including PT, OT, and SLP. Progress has been very slow. May need to consider other discharge venue   - 9/16: Declining with therapies; pleasantly confused today, interactions much less withdrawn but nonsensical. Will need  Mod-Max A at home. Concern for physical decline exceeding family ability for caregiving if no improvements moving forward.   - 9-17 waiting on calorie counts today to determine if feeding tube needed; if patient/family continue to refuse, would recommend goals of care discussion and possible palliative care consult--patient opted for placement of tube feed  - 9-18: Due to ongoing significant support needed to maintain blood pressure, as well as chills today, discussed additional workup/treatment with infectious disease; recommend against treatment of yeast at this time given likely contamination, given ongoing decline will get lactic acid and repeat CT abdomen and pelvis without contrast in a.m--lactic acid upper limit of normal 1.9, CT abdomen and pelvis results -right ventral pelvic wall fluid collection not changed in size from prior study, distal colonic diverticulosis, small bilateral pleural effusions and dependent lower lobe atelectasis, IR consulted yesterday but holding off on drainage of fluid collection  - . Repeat imaging performed yesterday and shows stable fluid collection in the abdominal wall. Patient without elevated WBC, remains afebrile, collection appears  similar in size and nature. Per Dr. Jenna, no aspiration or drainage planned for now.    -  9/23: Wife continues to do all cares - doing walking trials with OT to work on endurance and transfers, remains Mod-Max A to stand but once on his feet can be Min A 46 feet. PT Mod-Max A sit to stand. PO intakes much improved last 2 days.  Likely stedy; has home hospital bed without power lift so will check height for transfers.   - Family meeting today: Est Min-Mod A at home, Wife to work with patient closely over the next week to determine if able to handle needs at home.  - Teams tomorrow AM.    2.  H/o PE/Antithrombotics: -DVT/anticoagulation:  Pharmaceutical: continue Eliquis              -antiplatelet therapy: N/A  - No further blood appreciated in nephrostomy tubes  3. Pain Management: Oxycodone  prn.    - 9/16: Oxycodone  was removed yesterday due to concerns of this contributing to PM confusion and hypotension; nursing reports family in disagreement with this and patient pain increased. Adjust to Tylenol  PRN for mild to moderate, Percocet 5 mg for severe pain to avoid undertreatment. Monitor BP VERY closely  9-18: Has not required Percocet again since 9-15  9/21 patient reports pain is overall under control  4. Mood/Behavior/Sleep: LCSW to follow for evaluation and support.              --melatonin for insomnia.              -antipsychotic agents: N/A  9-14: Per patient, sleeping well.  Sleep log.  Discussed delirium management with patient's wife  - 9-18 sleep log remains appropriate, no apparent hallucinations or delusions.  Continue current management  5. Neuropsych/cognition/encephalopathy likely secondary to metabolic acidosis vs delirium from prolonged illness/hospitalization: This patient is not capable of making decisions on his own behalf.  - 9-11: Increasingly withdrawn, behavioral changes may be secondary to poor nutritional intake and hyponatremia.  Treating as below, on sertraline   100 mg daily, may need to add additional agent such as mirtazapine but hesitant given ongoing cognitive issues.  9-12: Slightly better today, but consistent with hypoactive delirium with primary difficulty with concentration, no focal neurologic deficits.  Start overnight sleep log, delirium precautions.  Suspect hyponatremia as primary contributor, management as below.  9/13: D3C Flexeril , Megace , Zoloft  due to ongoing cognitive changes.  Sodium is improving.  Get LFTs, ammonia today to evaluate ongoing cognitive deficits--within normal limits.  -14: Cognition seems to be clearing a bit, continue current regimen.   9/15 alert but delayed. Continue to maximize renal/volume/nutritional status 9/16: Much more alert, but delirious; sleep log appears appropriate. Will add Zyprexa  5 mg PRN for aggitation d/t increased aggitation yesterday afternoon. Keep low dose / low likelihood of Qtc prolongation medications only.   9-18: Cognition improving very gradually, QTc remains prolonged.  DC zyprexa  due to QTC   9-22: Doing much better this week.  Continue with adequate nutrition, hydration    6. Skin/Wound Care: Routine pressure relief measures. Encourage adequate diet             --dressing changes daily. Continue Gerhardt's cream to buttock wound.   -abdominal wound continues to close despite tenous nutritional status  7. Fluids/Electrolytes/Nutrition/oropharyngeal dysphagia/moderate to severe protein calorie malnutrition:                --9/10 periactin  yesterday stopped d/t lethargy   -will try megace  beginning today   -RD f/u   -push fluids   -recheck bmet in am   -continue juven bid. Has been refusing Ensure supplement.   -kdur 20meq daily   -can't use zofran  d/t QTc prolongation (490)  - MBS   9/3--D3/thins, prealbumin borderline normal at 20   - 9-11: Nausea remains a primary barrier to adequate p.o. intakes.  Recheck EKG today, QTc was 460, scheduled Zofran  4 mg IV 3 times daily AC.  Will  repeat EKG in next 1 to 2 days.  - 9-12: Albumin  infusion per nephrology.  P.o. intakes improving with Zofran .  per IV team midline contraindicated due to bicarb.  May get PICC line once dialysis no longer considered.  9-14: P.o. intakes okay, Zofran  continues to help with nausea.  Per nephrology, okay to reduce fluids, will keep on 70 cc/h normal saline for maintenance for now.  PICC line order placed.  9/15 intake 20-30%. Receiving mtc IVF.    -k+ 3.3, mg++ 1.3   -continue kdur 20meq daily and add 40meq to IVF   -IV mag sulfae 2g x1, add mag gluconate oral daily    9/16: Mag, K repleted. PO intakes remain very poor. Albumin  2.0. Continue maintenance fluids--will likely need TF.   9/17: Ad phosnak 1 packet x3 doses for repletion of K and Phos; repeat in AM--nephrology changed to IV--repleted 9-18  9-22: Nutrition doing much better, eating 50 to 100% of meals.  Replete electrolytes as below. 9/23: Finishing calorie count per SLP today - PO intakes MUCH better - hopeful to remove cortrak tube otherwise will get IR consult to evaluate if PEG possible 9-24: Dietary stating patient is to keep up with 50% of his energy needs, can consider removing core track tube.  Will wait for labs in a.m. to ensure nutrition and hydration continue to be adequate before removing; did just DC free water  flushes and IV fluids 9-25: Core track DC'd, patient labs look okay.  Really encourage p.o. fluids, resuming IV maintenance as below due to tachycardia but should really push p.o. intakes this week. 9-26: P.o. intakes really good, bring maintenance fluids down to 40 cc/h, BMP and Mag in AM 9-29: Creatinine slightly up, encourage p.o. fluids, but not quite at AKI level yet.  Other labs are stable.  Magnesium  holding up.  8. Sepsis/Abdominal wall/mesh infection: IV Vanc/Ceftriaxone  for 6 weeks from 08/21 (10/2)             --  Probable 6 months total of antibiotics  --Corynebacterium species-->6 more weeks ?Dalbavancin for  OP use, cont ceftriazone and linezolid  for now  per ID -appreciate recent ID f/u CRP nl 0.8 ESR 44 WBC 8.2 9-11: WBC labs remained stable. 9/15 wbc 7.1 9/16: Discussed with Dr. Dennise ID possibility of encephalopathy with current antibiotics, as well as +UA yesterday. Recommendation to continue current broad spectrum antibiotics as they are unlikely contributors and regimen has already been altered from vancomycin . Given some yeast in UA will await culture to see if secondary fungal infection present vs contaminant. -pending 9/17 9/18; -LA 1.9, CT abdomen and pelvis results  --see above 9-22: Per Dr Dennise,  then continue oritavancin  and ceftriaxone  till 10/2.  After which can do 6 more weeks on omdada+dalvance/tedezolid.  Continue Rocephin .-- will double check with ID on this timeline 9/23 9-24: Infectious disease, pharmacy working with family today on discharge antibiotic regimen from a cost intolerance perspective; I agree with trialing his home antibiotic regimen in the hospital to ensure that nausea does not limit him again 9-25: Extensive discussions with infectious disease Dr. Overton and Dr. Sheldon today; feel mesh infection is less likely, decision to remove antibiotics and monitor closely over the next week.  Patient will get CT abdomen close to discharge to reevaluate size of abscess.  CRPs every 72 hours and other labs for monitoring per ID.  Patient and family understanding and agreeable with this.  9. Left> right hydronephrosis: Nephrostomy tubes with stent exchanged on 08/27 --continue clamping right tube and left to drain per urology.  Monitor for blood recheck CBC in am --hemoglobin stable, no further bloody output appreciated -9/15 flush left nephrostomy tube 9-18: Prior right tube site continues to drain, CT abdomen and pelvis as above - 9-27: Discussed with urology exchange of nephrostomy tubes; will address as outpatient per their service  10. Acute on chronic renal  failure/hyponatremia:  elevated CR- baseline creat probably ~1.4  -9/10 will give addnl IVF today d/t poor po intake   -recheck bmet in am  - 9-11: AKI significantly worse, now with hyponatremia 131.  Will give 1 L fluid bolus normal saline today, recheck BMP; given brisk uptrend despite increase in IV fluids will consult nephrology for further recommendations.  - 9-12: Appreciate nephrology input and recommendations.  Responding slightly to increased IV fluids overnight, normal saline 125/h.  Hyponatremia remains 131. On multiple medications that could be contributing including Florinef , sertraline , and linezolid .  See their note for specific recommendations; advised to remain on fluids for primary treatment of hyponatremia, do not recommend salt tabs at this time.  - If renal function does not improve with fluids may need CT scan of the abdomen and pelvis to verify patency of nephrostomy/ileal conduit drainage   9-13: Potassium chloride  packet 40 mill equivalents ordered this a.m. for repletion.  Sodium improving, bicarb improving, but renal function remains unchanged on bicarb drip.  9-14: Renal function improved, acidosis resolved, sodium 134.  Per nephrology, can DC fluids, but agreeable to maintenance fluids given ongoing poor p.o. intakes.  Continue to monitor closely.  9/15-17 Cr continues to improve gradually, continue IVF, push po  9/18: On TF, DC IVF, move to FWF 200 ccs QID--monitor closely  9-19: Seems to be tolerating free water  flushes well, continuing to improve.  Continue through this weekend.     9/21 creatinine down to 1.29/BUN 40 continue current regimen and monitor  9-22: BUN 35, creatinine 1.19.  9/23: ok to check renal function 2x  weekly, now GFR >60. DC FWF, encourage PO. --Labs in a.m.  9-25: Renal function stable, however resuming some IV fluids as below.  9/26: Encourage p.o., reducing IV fluids to 40 cc/h.  BMP tomorrow--can come off of IV fluids over this weekend if p.o.  fluid intakes are adequate  9-29: Creatinine continues to uptrend, not quite AKI.  Encourage p.o. fluids.  Overall, doing much better.  11. OSA: Encourage CPAP use 12. Hypotension/tachycardia/?adrenal insufficiency: Continue Midodrine  15 mg TID and Florinef  2 mg daily. Plasma cortisol 13.6 6/25/             --continues to have significant tachycardia with activity likely due to orthostasis and deconditioning.  - -hgb 11.5   -continue metoprolol  low dose 12.5mg  bid, bp remains soft but stable at present   -  IVF as above Per PT- has been not dropping in therapy   9-13: Orthostatic today, DC Lopressor  and increase midodrine  to 20 mg 3 times daily.  Has been getting a significant amount of IV fluid last few days, still likely volume depleted per nephrology.  9-14: Blood pressure remains stable, but soft. 9/15 hypotensive this morning.  Continue IVF and plan as above 9/16: A bit improved today; continue IVF, no signs of volume overload. If profoundly hypotensive again may need to transfer to acute given maximizing fluids/BP support without pressors.  9-18: DC IV fluid as above today; monitor blood pressures very closely, seem more stable than they were last week.  On chart review, was minimally responsive to trial of high-dose steroids in August; does not look like cosyntropin stim test was ever performed; Continue Florinef  for now, may need to consider if blood pressures do not improve 9-19: Blood pressure holding up in the low 100s, continue current regimen. 9/20-21 BP stable, continue to monitor  9/23: Stable -reduce midodrine  back to 15 mg 3 times daily--> 10 mg 3 times daily 9-24.  If tolerates well, will start reducing Florinef  next.  May need rate control agent although current tachycardia seems to be compensatory sinus tach 9-24: Continues to get tachycardic into the 150s with exertion, comes down to 90s to 110s with rest.  Discussed with cardiology, appreciate recommendations, clearly  compensatory and should not treat.  Given poor p.o. fluid intakes, will resume IV normal saline at 70 cc/h for maintenance and really encourage p.o. fluids 9-26: Looking much better today.  Reduce maintenance fluids as above.  Monitor on reduced dose of Florinef  over the weekend, and if he does well would further reduce to 0.05 mg 9-29: Reduce dose of Florinef  to 0.05 mg; monitor therapy tolerance, goal to wean off before discharge     08/10/2024    8:28 PM 08/10/2024    3:15 PM 08/10/2024    4:40 AM  Vitals with BMI  Systolic 117 125 886  Diastolic 67 75 67  Pulse 88 109 82     13. 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  -- resolved  9/1 held reglan   -9/16: resume reglan  given low OP, nausea--with benefit, output remains low   - 9/18: Ostomy output picking up considerably with tube feeds; on fibercon BID; appreciate nutrition input--staying appropriate   - 9/23-29: Ostomy OP appropriate; remains quite liquid but not large-volume.  Continue current regimen.  14. Depression: On Zoloft -- Dced 9/13 due to hyponatremia and confusion   - 9/16: Family concerned for serotonin syndrome with zoloft +linezolid - no tachycardia, fevers, seizures, or severe agitation reported - encephalopathy has not improved with removal of  zoloft . Low suspicion at this time.   15. Malnutrition/: Albumin  @1 .9/TP-5.1.  --> 2.2 9/11;  - 9-11: Adding Premeal Zofran  as above.  Will get nutrition to start calorie count; may need additional supplementation or discussion of feeding tube if does not improve.  - 9-12: Slightly improved, continue this regimen.  9-13: Very poor p.o. intakes yesterday.  DC Megace  due to possible cognitive effects, no perceived benefit.  Will discuss possible feeding tube with family--patient rejects, will get calorie count for 48 hours and if no improvement may readdress tube feed versus palliative consult  9-14: Wife encouraging p.o.'s, patient motivated to avoid feeding tube.   Calorie count started.  Maintenance fluids as above.  9/16: Qtc 519; DC zofran , resume relgan 5 mg TID AC as he did well with this in the past and ostomy output has slowed considerably  9-17: QTc remains prolonged; will not resume Zofran  at this time.  Patient agreeable to placement of core track and discussed with nutrition starting with nocturnal tube feeds for supplementation, to allow appetite during the day to continue to work on increasing p.o.'s.  Will give one-time Xanax  to assist in anxiety with core track placement.  9-18: Tolerating tube feeds well, labs looking stable.  P.o. intakes poor this a.m. due to timing of therapy/medication/breakfast, continue to encourage as primary form of nutrition.  9-19: Tolerating overnight tube feeds well, p.o. intakes during the day complicated by gagging/coughing due to tube feed placement.  Chest x-ray yesterday was negative.  Orders to maintain head of bed 30 degrees whether or not tube feeds are running, and viscous lidocaine  4 times daily as needed for sensation of gagging.  9/20 nausea noted today, patient did get  dose of Compazine  and is continued on Reglan .  Will check with pharmacy to see if any new medications could be contributing.Appears he was transition to nocturnal tube feeds may be rate a little high for him will decrease to 60 mL/h overnight.  Discussed with pharmacy he got a one-time dose of Oritivancin yesterday, he should not receive any more of this as this was a one-time order  -9-22- nausea improved with current regimen--helping considerably with p.o. intakes, may be able to DC tube feeds this week.  Dietary resumed calorie count today.  9/23: Nausea resolved; pending stable PO intakes may DC reglan  this week--DC 9-26   16. Anemia of chronic illness: Stable overall 9/8-11  - Hemoglobin stable 9-10  9-22:Hemoglobin from 9.7->8.6   No obvious sources of bleeding, repeat in AM.  9/23: HgB stable 8.3--> 8.0--> 8/6       Latest Ref Rng &  Units 08/10/2024    4:29 AM 08/06/2024   12:33 AM 08/04/2024    4:54 AM  CBC  WBC 4.0 - 10.5 K/uL 6.6  7.0  6.0   Hemoglobin 13.0 - 17.0 g/dL 8.6  8.0  8.3   Hematocrit 39.0 - 52.0 % 27.1  25.6  25.9   Platelets 150 - 400 K/uL 321  132  72     17.  Thrombocytopenia.  Mild, 136 today.  Monitor very closely in setting of linezolid .   - 9/19: 104; per Dr. Dennise likely adjusting linezolid  to orvitatancin starting Monday.  Continue to trend  9-22: Remains downtrending, trend.  71 today.  May take a few days to bounce back from linezolid   9/23: PLT stabilized 72; monitor  9-25: Platelets starting to come back, 130s today.  Would be cautious to resume linezolid    9-29: Platelets  normalized.  18.  Hypophosphatemia.  1.3 to 3.1 with IV repletion, next day 1.6.  - 9-19: Per electrolyte protocol, start Phos-Nak Powder via tube feed 2 packets 4 times daily for 5 doses  - Repeat daily.  -9/20 replete today, 15mmol IV,  discussed dose pharmacy  -9/21 improved to 1.9, continue replete per pharm rec   -22: 2.3, replete today, check in a.m.--repleted  19. Low MG  -9/20 replete today 2g IV,  discussed dose pharmacy  -9/21 improved today continue to monitor   -Magnesium  stable on recent labs   LOS: 30 days A FACE TO FACE EVALUATION WAS PERFORMED  Kristopher Thompson 08/10/2024, 11:01 PM

## 2024-08-10 NOTE — Progress Notes (Signed)
 Speech Language Pathology Weekly Progress and Session Note  Patient Details  Name: Kristopher Thompson MRN: [id number] Date of Birth: Apr 08, 1951  Beginning of progress report period: August 03, 2024 End of progress report period: August 10, 2024  Today's Date: 08/10/2024 SLP Individual Time: 1445-1530 SLP Individual Time Calculation (min): 45 min  Short Term Goals: Week 4: SLP Short Term Goal 1 (Week 4): STGs= LTGs due to ELOS SLP Short Term Goal 1 - Progress (Week 4): Progressing toward goal    New Short Term Goals: Week 5: SLP Short Term Goal 1 (Week 5): STGs - LTGs d/t ELOS  Weekly Progress Updates: Pt has made excellent progress this week. He demonstrates improvements in memory, attention, information processing, and problem solving. He also presents w/ improved pharyngeal clearance and tolerates a D3 diet/thin liquids w/ minimal to no difficulty. Dischharge postponed until 10/3, but he remains on track to meet his LTGs by time of d/c. Recommend cont ST per POC to maximize pt independence/swallow safety and reduce caregiver burden.   Intensity: Minumum of 1-2 x/day, 30 to 90 minutes Frequency: 3 to 5 out of 7 days Duration/Length of Stay: 10/3 Treatment/Interventions: Cognitive remediation/compensation;Functional tasks;Internal/external aids;Patient/family education;Medication managment;Dysphagia/aspiration precaution training;Therapeutic Activities;Therapeutic Exercise   Daily Session  Skilled Therapeutic Interventions: Pt greeted at bedside. He was up in his TIS WC upon SLP arrival, very agreeable to tx targeting cognition. He completed tasks of moderate complexity x 2 (deductive reasoning and map task), requiring only supervisionA for information processing and attention to detail. Adequate selective attention noted throughout tasks. Additionally, processing time continues to improve. Pt endorsed improved cognition as compared to 1-2 weeks ago, but does not feel  cognitive-linguistic function has returned to baseline at this time. At the end of tx tasks, he was left in bed w/ the alarm set and call light within reach. Recommend cont ST per POC.    Pain  None reported  Therapy/Group: Individual Therapy  [name] 08/10/2024, 9:22 PM

## 2024-08-10 NOTE — Progress Notes (Signed)
 Nutrition Follow Up  DOCUMENTATION CODES:  Non-severe (moderate) malnutrition in context of chronic illness (bladder cancer, tachycardia, hx bowel perforation)  INTERVENTION:  Continue DYS 3 PO diet and monitor PO intake  Encouraged drinking 1-2 CorePower shakes brought in by wife from home  Encouraged eating meals from favorite places to promote adequate intake  Recommended continued adequate intake once discharged  NUTRITION DIAGNOSIS:  Moderate Malnutrition related to chronic illness (bladder cancer, tachycardia,  hx bowel perforation) as evidenced by moderate fat depletion, moderate muscle depletion, percent weight loss. Remains applicable  GOAL:  Patient will meet greater than or equal to 90% of their needs Progressing  MONITOR:  PO intake, Supplement acceptance, Weight trends  REASON FOR ASSESSMENT:  Consult Assessment of nutrition requirement/status, Calorie Count, Poor PO  ASSESSMENT:  Pt admitted to CIR with debility following most recent admission for fatigue, mental status changes, tachycardia, hypotension and purulent drainage from abdominal wall drain. PMH significant for HFpEF, CAD, T2DM, PE on eliquis , prostate cancer, bladder cancer s/p cystoprostatectomy and ileal conduit (2013), recent admission 04/10/24 for parastomal-incisional abdominal wall hernia repair w/graft and ileal conduit reversal c/b intra-abdominal and abdominal wall abscess from bowel perforation s/p end ileostomy.  Recent Admissions: 5/30-7/11: bowel perforation s/p SBR/end ileostomy 7/14-7/16: AKI, infection of ileostomy site 7/16-8/8: Admitted to CIR 8/20-8/30: Admitted to [facility]: AKI, severe sepsis  Current Admission: 8/30 admitted to CIR 9/3 MBS, recommended DYS 3 w/ thins 9/14-9/15: calorie count; meeting 20-22% kcal and 13-21% protein needs PO 9/17 Cortrak placed 9/22-9/24 Calorie count conducted; meeting 54% kcal and 48% protein needs PO 9/24 Cortrak removed, tube feeds  discontinued   Spoke with pt and pt's wife who was at bedside. Pt eating well and diet summary documentation shows pt consuming on average 75% of last 7 meals and drinking 1 corepower shake daily. Pt looks much better this week and appears to have more energy and expressed happiness in regards to his progress in rehab. Discussed continued supplement intake once discharged and praised pt for progress he has made. Encouraged continued adequate intake once discharged and making sure pt continues to eat enough protein at meals. Added High Calorie High Protein handout from the [employer] to AVS to give wife ideas of ways to help pt meet calorie and protein needs once home.   Average Meal Completion: 8/30-9/2: 43% average intake x 6 recorded meals 9/1-9/5: 31% average intake x 8 recorded meals 9/8-9/14 23% intake x 13 recorded meals 9/16-9/18: 60% average intake x 6 recorded meals 9/22-9/23: 77% average intake x 5 recorded meals 9/25-9/27: 75% average intake x 7 recorded meals  Medications:  Scheduled Meds: Children's multivitamin BID Vitamin B12 500 mcg 3x/week Imodium  TID Magnesium  gluconate 500 mg daily Reglan  TID Midodrine  TID Lovaza  daily Fibercon BID Potassium chloride  10 mEq  Labs reviewed  Ileostomy: 975 ml x 24 hr Urostomy:  1400 mL x 24 hr Nephrostomy (L): 400 mL out x 24 hr    Diet Order:   Diet Order             DIET DYS 3 Room service appropriate? Yes; Fluid consistency: Thin  Diet effective now                   EDUCATION NEEDS:  Education needs have been addressed  Skin: Reviewed RN Assessment MASD to the right buttocks (2.5 x 6 cm) Healing midline incision from surgery 7/22  Last BM:  ileostomy output 975 mL x 24 hr  Height:  Ht Readings from Last 1 Encounters:  07/12/24 5' 11 (1.803 m)    Weight:  Wt Readings from Last 1 Encounters:  08/10/24 105 kg   BMI:  Body mass index is 32.29 kg/m.  Estimated Nutritional  Needs:   Kcal:  2000-2200  Protein:  95-115g  Fluid:  >/= 2L   Josette Glance, MS, RDN, LDN Clinical Dietitian I Please reach out via secure chat

## 2024-08-10 NOTE — Progress Notes (Signed)
 Physical Therapy Session Note  Patient Details  Name: Kristopher Thompson MRN: [id number] Date of Birth: 05/18/1951  Today's Date: 08/10/2024 PT Individual Time: 0920-1000 PT Individual Time Calculation (min): 40 min   Today's Date: 08/10/2024 PT Individual Time: 8695-8585 PT Individual Time Calculation (min): 70 min   Short Term Goals: Week 4:  PT Short Term Goal 1 (Week 4): STGs = LTGs  Skilled Therapeutic Interventions/Progress Updates:     Pt received seated at edge of bed and agrees to therapy. No complaint of pain. Pt performs sit to stand with cues for hand placement and sequencing. Pt ambulates x135' with RW and cues for upright gaze to improve posture and balance, and increasing stride length to decrease risk for falls. Seated rest break. Pt completes car transfer without AD, requiring minA to lift feet in to car, but otherwise performing without physical assistance. Seated rest break. Pt then completes x3 6 steps with bilateral handrails and cues for step sequencing and safety, with CGA provided overall. Seated rest break. PT adds 3lb ankle weights to each leg to provide increased strengthening during ambulation, and pt completes x30' with RW and CGA. Pt performs additional bout of same distance prior to returning to room. Left seated in [facility] with all needs withinr each.    2nd Session: Pt received seated in [facility] and agrees to therapy. No complaint of pain. WC transport to gym for time management. Pt performs sit to stand from tilt in space chair with cues for initiation and hand placement. Pt ambulates with RW and CGA primarily to hold up pants, x175' with cues for upright gaze to improve posture and balance, and increasing stride length to decrease risk for falls. Pt takes extended seated rest breaks. Pt then ambulates in and out of cones to provide dynamic balancing challenge, with PT providing demonstration and cues on optimal body mechanics. Pt is able to complete ~60' and then requests  to take rest break due to feeling as if heart and lungs could not keep up. Pt takes extended seated rest breaks prior to competing x2 additional bouts of ambulation through cones. HR noted to increase to ~150 following each bout and then requires several minutes to return to low 100s. Pt performs resisted sit to stand repetitions with yellow theraband to promote improved body mechanics, sequencing and strengthening. Pt requires frequent and extended rest breaks. WC transport back to room. Left seated with all needs within reach   Therapy Documentation Precautions:  Precautions Precautions: Fall (HOH, Urostomy, Colostomy, PCN  drain, and orthostatic hypotension.) Recall of Precautions/Restrictions: Impaired Precaution/Restrictions Comments: urostomy, colostomy, lt PCN drain, watch BP and HR Restrictions Weight Bearing Restrictions Per Provider Order: No   Therapy/Group: Individual Therapy  Elsie JAYSON Dawn, PT, DPT 08/10/2024, 4:11 PM

## 2024-08-10 NOTE — Progress Notes (Signed)
 Occupational Therapy Session Note  Patient Details  Name: Kristopher Thompson MRN: [id number] Date of Birth: [birth year]  Today's Date: 08/10/2024 OT Individual Time: 1115-1200 OT Individual Time Calculation (min): 45 min    Short Term Goals: Week 4:  OT Short Term Goal 1 (Week 4): Pt will complete sit > stand with min A using LRAD OT Short Term Goal 2 (Week 4): Pt will complete stand pivots with min A OT Short Term Goal 3 (Week 4): Pt will tolerate standing for 1 min to increase activity tolerance  Skilled Therapeutic Interventions/Progress Updates:    Pt received resting in [facility] presenting to be in good spirits receptive to skilled OT session reporting 0/10 pain- OT offering intermittent rest breaks, repositioning, and therapeutic support to optimize participation in therapy session. Completed washing face while seated in WC at the sink with setup A. Transported Pt to main gym via WC d/t time management. Ambulated approximately 145 ft utilizing RW with CGA for safety and provided rest break to increase energy level. Ambulate approximately 155 ft with CGA back to main gym to increase endurance for ADLs and mobility. Completed dynamic standing balance activity alternating LEs, tap cones with each foot with CGA. Graded activity up to increase prolonged single leg balance and crossing midline. Small LOB with Pt able to correct with CGA for safety. Transported Pt back to room via WC d/t time management. Talked with wife about activities complete and Pt became emotional with progress made. Pt was left resting in [facility] with call bell in reach, wife present, and all needs met.    Therapy Documentation Precautions:  Precautions Precautions: Fall (HOH, Urostomy, Colostomy, PCN  drain, and orthostatic hypotension.) Recall of Precautions/Restrictions: Impaired Precaution/Restrictions Comments: urostomy, colostomy, lt PCN drain, watch BP and HR Restrictions Weight Bearing Restrictions Per Provider Order:  No  Therapy/Group: Individual Therapy  Paulina Fleeta Dixie 08/10/2024, 12:29 PM

## 2024-08-11 NOTE — Plan of Care (Addendum)
 LTGs updated given progress this week  Problem: RH Expression Communication Goal: LTG Patient will verbally express basic/complex needs(SLP) Description: LTG:  Patient will verbally express basic/complex needs, wants or ideas with cues  (SLP) Flowsheets (Taken 08/11/2024 1024) LTG: Patient will verbally express basic/complex needs, wants or ideas (SLP): Supervision   Problem: RH Problem Solving Goal: LTG Patient will demonstrate problem solving for (SLP) Description: LTG:  Patient will demonstrate problem solving for basic/complex daily situations with cues  (SLP) Flowsheets (Taken 08/11/2024 1024) LTG Patient will demonstrate problem solving for: Supervision   Problem: RH Memory Goal: LTG Patient will demonstrate ability for day to day (SLP) Description: LTG:   Patient will demonstrate ability for day to day recall/carryover during cognitive/linguistic activities with assist  (SLP) Flowsheets (Taken 08/11/2024 1024) LTG: Patient will demonstrate ability for day to day recall/carryover during cognitive/linguistic activities with assist (SLP): Supervision

## 2024-08-11 NOTE — Progress Notes (Signed)
 Occupational Therapy Weekly Progress Note  Patient Details  Name: Kristopher Thompson MRN: [id number] Date of Birth: [birth year]  Beginning of progress report period: 08/04/24 End of progress report period: 08/11/24  Patient has met 3 of 3 short term goals.  Kristopher Thompson continues to excel in [employer] and has had an incredible week of progress. He is at a CGA level overall with ADLs and transfers using the RW. He is progressing with activity tolerance overall and able to engage in full OT sessions with excellent motivation and participation. His wife Kristopher Thompson is incredibly supportive and feels comfortable providing the (S)- CGA level of care he will need at home.   Patient continues to demonstrate the following deficits: muscle weakness, decreased cardiorespiratoy endurance, and decreased standing balance, decreased postural control, and decreased balance strategies and therefore will continue to benefit from skilled OT intervention to enhance overall performance with BADL and iADL.  Patient progressing toward long term goals.  Continue plan of care.  OT Short Term Goals Week 4:  OT Short Term Goal 1 (Week 4): Pt will complete sit > stand with min A using LRAD OT Short Term Goal 1 - Progress (Week 4): Met OT Short Term Goal 2 (Week 4): Pt will complete stand pivots with min A OT Short Term Goal 2 - Progress (Week 4): Met OT Short Term Goal 3 (Week 4): Pt will tolerate standing for 1 min to increase activity tolerance OT Short Term Goal 3 - Progress (Week 4): Met Week 5:  OT Short Term Goal 1 (Week 5): STG= LTG d/t ELOS  [name] 08/11/2024, 11:29 AM

## 2024-08-11 NOTE — Patient Care Conference (Signed)
 Inpatient RehabilitationTeam Conference and Plan of Care Update Date: 08/11/2024   Time: 1009 am    Patient Name: Kristopher Thompson      Medical Record Number: [id number]  Date of Birth: 1951/06/24 Sex: Male         Room/Bed: 4W26C/4W26C-01 Payor Info: Payor: MEDICARE / Plan: MEDICARE PART A AND B / Product Type: *No Product type* /    Admit Date/Time:  07/11/2024 12:40 PM  Primary Diagnosis:  Critical illness myopathy  Hospital Problems: Principal Problem:   Critical illness myopathy Active Problems:   Cognitive and behavioral changes   Malnutrition of moderate degree    Expected Discharge Date: Expected Discharge Date: 08/14/24  Team Members Present: Physician leading conference: Dr. Joesph Likes Social Worker Present: Graeme Jude, LCSW Nurse Present: Eulalio Falls, RN PT Present: Kirt Dawn, PT OT Present: Nena Moats, OT SLP Present: Recardo Mole, SLP PPS Coordinator present : Eleanor Colon, SLP     Current Status/Progress Goal Weekly Team Focus  Bowel/Bladder   Pt has an Ileostomy and a Nephrostomy with strict I&O   Both Ileostomy and Nephrostomy will maintain patency, and be free of any s/s of infection   Ensure  patency of Nephrostomy and ileostomy is maintained.  Maintain meticulous hygiene, manage drainage effectivley, and protect the skin around the surgical sites.    Swallow/Nutrition/ Hydration   D3/thin reports minimal to no concerns re dysphagia   modI  pt/family education re esophageal    ADL's   Incredible progress, CGA for transfers, (S) UB ADLs, mod A LB ADLs   (S) to CGA   ADLs, transfers, endurance, d/c planning    Mobility   supervision bed mobility, CGA transfers and gait up to 175' with RW, x3 steps with BHRs with CGA   modA  DC prep, endurance, transfers, bed mobility    Communication                Safety/Cognition/ Behavioral Observations  supervision, emerging modI with cognitive tasks of increased complexity   modA  (will upgrade)   cognitive retraining, pt/family education    Pain   Pt currently denies pain or any discomfort   Pt will continue exhibit no s/s of pain   Pt will continue to exhibit no s/sx of pain    Skin   Pt has skin alterations to his buttocks and a surgical abdominal incision with daily dressing changes. Pt is turned and repositioned Q 2hrs for positioning, comfort and skin integrity   Relieving pressure to facilitate healing and preventing further breakdown  Relieving pressure to facilitate healing, and  preventing further breakdown      Discharge Planning:  Pt will d/c to home with his wife. Family to move into home to help assist. No longer requires IV abx. [facility]. SW will confirm there are no barriers to discharge.    Team Discussion: Patient admitted post sepsis due to critical illness myopathy. Patient with incredible progress due to much better po intakes, weaning off blood pressure medications, very eager to participate with therapy and ostomy tubes functioning well.  Patient on target to meet rehab goals: yes, patient needs supervision assistance with ADLs. Patient needs CGA with transfers. Patient was able to ambulate up to 175' using a rolling walker. Patient is an emerging mod I with cognition. Wife continues to do all cares. Overall goals at discharge are set for moderate assistance.   *See Care Plan and progress notes for long and short-term goals.   Revisions  to Treatment Plan:  Upgraded goals  D3 thin diet Continue picc line  Teaching Needs: Safety, medications, encourage po intake and fluid intake, transfers, etc   Current Barriers to Discharge: Decreased caregiver support, Home enviroment access/layout, IV antibiotics, and Wound care  Possible Resolutions to Barriers: Family Education Home health follow-up     Medical Summary Current Status: Debility, abdominal abscess, orthostatic hypotension, , severe protein-calorie malnutrition, AKI  on CKD, multiple wounds/drains  Barriers to Discharge: Inadequate Nutritional Intake;Hypotension;Cardiac Complications;Behavior/Mood;Pending surgery/plan;Renal Insufficiency/Failure;Self-care education;Medical stability;Electrolyte abnormality;Complicated Wound   Possible Resolutions to [name]: encourage p.o. intakes per nutrition, renal monitoring for recurrent AKI with high fluid needs, surgical assistance in management of multiple tubes/drains and abdominal wound, wean BP medications as tolerated   Continued Need for Acute Rehabilitation Level of Care: The patient requires daily medical management by a physician with specialized training in physical medicine and rehabilitation for the following reasons: Direction of a multidisciplinary physical rehabilitation program to maximize functional independence : Yes Medical management of patient stability for increased activity during participation in an intensive rehabilitation regime.: Yes Analysis of laboratory values and/or radiology reports with any subsequent need for medication adjustment and/or medical intervention. : Yes   I attest that I was present, lead the team conference, and concur with the assessment and plan of the team.   Cristal Howatt Gayo 08/11/2024, 1009 am

## 2024-08-11 NOTE — Progress Notes (Signed)
 Patient ID: RAMCES SHOMAKER, male   DOB: [birth year], 73 y.o.   MRN: [id number]  SW went by pt room to provide updates from team conference, but no pt or family in room.   1425- SW called and spoke with pt wife to provide updates from team conference, and d/c date remains 10/3. SW informed will send orders to [facility] for HHPT/OT/SLP/SN.   SW spoke with [facility] to inform on pt beifn off Iv abx and has improved. Will put in order for HHPT/OT/SLP/aide/SN (ostomy edu).  Graeme Jude, MSW, LCSW Office: [phone] Cell: [phone] Fax: [phone]

## 2024-08-11 NOTE — Progress Notes (Signed)
 PROGRESS NOTE   Subjective/Complaints: No events overnight.  No acute complaints.  Doing very well overall, Vitals stable. remains afebrile, blood pressures holding up well.    08/11/2024    5:01 AM 08/10/2024    8:28 PM 08/10/2024    3:15 PM  Vitals with BMI  Systolic 111 117 874  Diastolic 66 67 75  Pulse 84 88 109     ROS: Patient denies fever, rash, sore throat, blurred vision, dizziness,  , vomiting, diarrhea, cough, shortness of breath or chest pain, joint or back/neck pain, headache, or mood change.     Objective:   No results found.     Recent Labs    08/10/24 0429  WBC 6.6  HGB 8.6*  HCT 27.1*  PLT 321     Recent Labs    08/10/24 0429  NA 141  K 4.1  CL 114*  CO2 17*  GLUCOSE 103*  BUN 21  CREATININE 1.25*  CALCIUM  8.7*    Intake/Output Summary (Last 24 hours) at 08/11/2024 1010 Last data filed at 08/11/2024 0500 Gross per 24 hour  Intake 240 ml  Output 375 ml  Net -135 ml        Physical Exam: Vital Signs Blood pressure 111/66, pulse 84, temperature 98.5 F (36.9 C), temperature source Oral, resp. rate 17, height 5' 11 (1.803 m), weight 105 kg, SpO2 100%.   Constitutional: No acute distress.  Sitting upright in bedside chair. HEENT: NCAT, EOMI, oral membranes moist.    Neck: supple, no JVD. Cardiovascular: RRR without murmur.  1+ bilateral upper extremity and lower extremity edema.--Improving Respiratory/Chest: CTA Bilaterally without wheezes or rales. Normal efforrt.  GI/Abdomen: soft, non-tender, non-distended.  Positive bowel sounds ostomy with liquid light brown stool- Ext: no clubbing, cyanosis. Psych: Appropriate mood and affect.  Increasingly dynamic.  Appropriate  Skin:  abdominal wound  with pink with granulation tissue, moist, covered in clean dressing. --Healing, clean looking Left nephrostomy tube with bag seal in place. --Unchanged R flank draining serosanguineous  fluid--continues 9-30; is closing up but continues to drain Urostomy with  mostly clear output, some white sediment. --Unchanged Ileostomy well-circumscribed, underlying pink healthy tissue--unchanged, with dark brown output  Neuro:   AAO.  [name], hospital, and time. Attention much improved from last week, can answer complex questions with mild deficits.  Complicated by poor hearing.  Normal language and speech.  Cranial nerve exam unremarkable.  MMT: 4+ out of 5 bilateral upper and lower extremities. No limb ataxia or cerebellar signs.  Bilateral upper and lower extremity tremors now very slight  Musculoskeletal: Able to straighten both knees out on bed.  Plantarflexion tone much improved.  Assessment/Plan: 1. Functional deficits which require 3+ hours per day of interdisciplinary therapy in a comprehensive inpatient rehab setting. Physiatrist is providing close team supervision and 24 hour management of active medical problems listed below. Physiatrist and rehab team continue to assess barriers to discharge/monitor patient progress toward functional and medical goals  Care Tool:  Bathing    Body parts bathed by patient: Chest, Face, Abdomen, Right arm, Left arm, Front perineal area, Right upper leg, Left upper leg   Body parts bathed by  helper: Buttocks, Right lower leg, Left lower leg     Bathing assist Assist Level: Moderate Assistance - Patient 50 - 74%     Upper Body Dressing/Undressing Upper body dressing   What is the patient wearing?: Hospital gown only    Upper body assist Assist Level: Moderate Assistance - Patient 50 - 74%    Lower Body Dressing/Undressing Lower body dressing      What is the patient wearing?: Pants     Lower body assist Assist for lower body dressing: Maximal Assistance - Patient 25 - 49%     Toileting Toileting Toileting Activity did not occur (Clothing management and hygiene only): N/A (no void or bm)  Toileting assist  Assist for toileting: Dependent - Patient 0%     Transfers Chair/bed transfer  Transfers assist  Chair/bed transfer activity did not occur: Safety/medical concerns  Chair/bed transfer assist level: Moderate Assistance - Patient 50 - 74%     Locomotion Ambulation   Ambulation assist   Ambulation activity did not occur: Safety/medical concerns (fatigue/dizziness/orthostatics)          Walk 10 feet activity   Assist  Walk 10 feet activity did not occur: Safety/medical concerns (fatigue/dizziness/orthostatics)        Walk 50 feet activity   Assist Walk 50 feet with 2 turns activity did not occur: Safety/medical concerns (fatigue/dizziness/orthostatics)         Walk 150 feet activity   Assist Walk 150 feet activity did not occur: Safety/medical concerns (fatigue/dizziness/orthostatics)         Walk 10 feet on uneven surface  activity   Assist Walk 10 feet on uneven surfaces activity did not occur: Safety/medical concerns (fatigue/dizziness/orthostatics)         Wheelchair     Assist Is the patient using a wheelchair?: Yes Type of Wheelchair: Manual           Wheelchair 50 feet with 2 turns activity    Assist    Wheelchair 50 feet with 2 turns activity did not occur: Safety/medical concerns       Wheelchair 150 feet activity     Assist  Wheelchair 150 feet activity did not occur: Safety/medical concerns       Blood pressure 111/66, pulse 84, temperature 98.5 F (36.9 C), temperature source Oral, resp. rate 17, height 5' 11 (1.803 m), weight 105 kg, SpO2 100%. Medical Problem List and Plan: 1. 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  Undergoing medical  workups as below, will monitor improvement for the next couple of days --address at teams if current level of care exceeds ability of home caregivers.  - 9-13: Bilateral lower extremity PRAFO's in bed for plantarflexion/tight heel cords  --continue CIR therapies including PT, OT, and SLP. Progress has been very slow. May need to consider other discharge venue   - 9/16: Declining with therapies; pleasantly confused today, interactions much less withdrawn but nonsensical. Will need Mod-Max A at home. Concern for physical decline exceeding family ability for caregiving if no improvements moving forward.   - 9-17 waiting on calorie counts today to determine if feeding tube needed; if patient/family continue to refuse,  would recommend goals of care discussion and possible palliative care consult--patient opted for placement of tube feed  - 9-18: Due to ongoing significant support needed to maintain blood pressure, as well as chills today, discussed additional workup/treatment with infectious disease; recommend against treatment of yeast at this time given likely contamination, given ongoing decline will get lactic acid and repeat CT abdomen and pelvis without contrast in a.m--lactic acid upper limit of normal 1.9, CT abdomen and pelvis results -right ventral pelvic wall fluid collection not changed in size from prior study, distal colonic diverticulosis, small bilateral pleural effusions and dependent lower lobe atelectasis, IR consulted yesterday but holding off on drainage of fluid collection  - . Repeat imaging performed yesterday and shows stable fluid collection in the abdominal wall. Patient without elevated WBC, remains afebrile, collection appears similar in size and nature. Per Dr. Jenna, no aspiration or drainage planned for now.    -  9/23: Wife continues to do all cares - doing walking trials with OT to work on endurance and transfers, remains Mod-Max A to stand but once on his feet can be Min A 46  feet. PT Mod-Max A sit to stand. PO intakes much improved last 2 days.  Likely stedy; has home hospital bed without power lift so will check height for transfers.   - Family meeting today: Est Min-Mod A at home, Wife to work with patient closely over the next week to determine if able to handle needs at home.  - 9/30: Emerging Mod I cog, D3 with thins for dysphagia.  Anticipate supervision level at discharge.  Doing very well    2.  H/o PE/Antithrombotics: -DVT/anticoagulation:  Pharmaceutical: continue Eliquis              -antiplatelet therapy: N/A  - No further blood appreciated in nephrostomy tubes  3. Pain Management: Oxycodone  prn.    - 9/16: Oxycodone  was removed yesterday due to concerns of this contributing to PM confusion and hypotension; nursing reports family in disagreement with this and patient pain increased. Adjust to Tylenol  PRN for mild to moderate, Percocet 5 mg for severe pain to avoid undertreatment. Monitor BP VERY closely  9-18: Has not required Percocet again since 9-15  9/21 patient reports pain is overall under control  4. Mood/Behavior/Sleep: LCSW to follow for evaluation and support.              --melatonin for insomnia.              -antipsychotic agents: N/A  9-14: Per patient, sleeping well.  Sleep log.  Discussed delirium management with patient's wife  - 9-18 sleep log remains appropriate, no apparent hallucinations or delusions.  Continue current management  5. Neuropsych/cognition/encephalopathy likely secondary to metabolic acidosis vs delirium from prolonged illness/hospitalization: This patient is not capable of making decisions on his own behalf.  - 9-11: Increasingly withdrawn, behavioral changes may be secondary to poor nutritional intake and hyponatremia.  Treating as below, on sertraline  100 mg daily, may need to add additional agent such as mirtazapine but hesitant given ongoing cognitive issues.  9-12: Slightly better today, but consistent with  hypoactive delirium with primary difficulty with concentration, no focal neurologic deficits.  Start overnight sleep log, delirium precautions.  Suspect hyponatremia as primary contributor, management as below.  9/13: D3C Flexeril , Megace , Zoloft  due to ongoing cognitive changes.  Sodium is improving.  Get LFTs, ammonia today to evaluate ongoing cognitive deficits--within normal limits.  -14: Cognition seems to be clearing a bit,  continue current regimen.   9/15 alert but delayed. Continue to maximize renal/volume/nutritional status 9/16: Much more alert, but delirious; sleep log appears appropriate. Will add Zyprexa  5 mg PRN for aggitation d/t increased aggitation yesterday afternoon. Keep low dose / low likelihood of Qtc prolongation medications only.   9-18: Cognition improving very gradually, QTc remains prolonged.  DC zyprexa  due to QTC   9-22: Doing much better this week.  Continue with adequate nutrition, hydration    6. Skin/Wound Care: Routine pressure relief measures. Encourage adequate diet             --dressing changes daily. Continue Gerhardt's cream to buttock wound.   -abdominal wound continues to close despite tenous nutritional status   - 9-30: Discussed with patient, will continue PICC until discharge due to difficult sticks and possibility of resuming antibiotics  7. Fluids/Electrolytes/Nutrition/oropharyngeal dysphagia/moderate to severe protein calorie malnutrition:                --9/10 periactin  yesterday stopped d/t lethargy   -will try megace  beginning today   -RD f/u   -push fluids   -recheck bmet in am   -continue juven bid. Has been refusing Ensure supplement.   -kdur 20meq daily   -can't use zofran  d/t QTc prolongation (490)  - MBS   9/3--D3/thins, prealbumin borderline normal at 20   - 9-11: Nausea remains a primary barrier to adequate p.o. intakes.  Recheck EKG today, QTc was 460, scheduled Zofran  4 mg IV 3 times daily AC.  Will repeat EKG in next 1 to 2  days.  - 9-12: Albumin  infusion per nephrology.  P.o. intakes improving with Zofran .  per IV team midline contraindicated due to bicarb.  May get PICC line once dialysis no longer considered.  9-14: P.o. intakes okay, Zofran  continues to help with nausea.  Per nephrology, okay to reduce fluids, will keep on 70 cc/h normal saline for maintenance for now.  PICC line order placed.  9/15 intake 20-30%. Receiving mtc IVF.    -k+ 3.3, mg++ 1.3   -continue kdur 20meq daily and add 40meq to IVF   -IV mag sulfae 2g x1, add mag gluconate oral daily    9/16: Mag, K repleted. PO intakes remain very poor. Albumin  2.0. Continue maintenance fluids--will likely need TF.   9/17: Ad phosnak 1 packet x3 doses for repletion of K and Phos; repeat in AM--nephrology changed to IV--repleted 9-18  9-22: Nutrition doing much better, eating 50 to 100% of meals.  Replete electrolytes as below. 9/23: Finishing calorie count per SLP today - PO intakes MUCH better - hopeful to remove cortrak tube otherwise will get IR consult to evaluate if PEG possible 9-24: Dietary stating patient is to keep up with 50% of his energy needs, can consider removing core track tube.  Will wait for labs in a.m. to ensure nutrition and hydration continue to be adequate before removing; did just DC free water  flushes and IV fluids 9-25: Core track DC'd, patient labs look okay.  Really encourage p.o. fluids, resuming IV maintenance as below due to tachycardia but should really push p.o. intakes this week. 9-26: P.o. intakes really good, bring maintenance fluids down to 40 cc/h, BMP and Mag in AM 9-29: Creatinine slightly up, encourage p.o. fluids, but not quite at AKI level yet.  Other labs are stable.  Magnesium  holding up.  8. Sepsis/Abdominal wall/mesh infection: IV Vanc/Ceftriaxone  for 6 weeks from 08/21 (10/2)             --  Probable 6 months total of antibiotics  --Corynebacterium species-->6 more weeks ?Dalbavancin for OP use, cont ceftriazone  and linezolid  for now  per ID -appreciate recent ID f/u CRP nl 0.8 ESR 44 WBC 8.2 9-11: WBC labs remained stable. 9/15 wbc 7.1 9/16: Discussed with Dr. Dennise ID possibility of encephalopathy with current antibiotics, as well as +UA yesterday. Recommendation to continue current broad spectrum antibiotics as they are unlikely contributors and regimen has already been altered from vancomycin . Given some yeast in UA will await culture to see if secondary fungal infection present vs contaminant. -pending 9/17 9/18; -LA 1.9, CT abdomen and pelvis results  --see above 9-22: Per Dr Dennise,  then continue oritavancin  and ceftriaxone  till 10/2.  After which can do 6 more weeks on omdada+dalvance/tedezolid.  Continue Rocephin .-- will double check with ID on this timeline 9/23 9-24: Infectious disease, pharmacy working with family today on discharge antibiotic regimen from a cost intolerance perspective; I agree with trialing his home antibiotic regimen in the hospital to ensure that nausea does not limit him again 9-25: Extensive discussions with infectious disease Dr. Overton and Dr. Sheldon today; feel mesh infection is less likely, decision to remove antibiotics and monitor closely over the next week.  Patient will get CT abdomen close to discharge to reevaluate size of abscess (would order 10/1-2).  CRPs every 72 hours and other labs for monitoring per ID.  Patient and family understanding and agreeable with this.  9. Left> right hydronephrosis: Nephrostomy tubes with stent exchanged on 08/27 --continue clamping right tube and left to drain per urology.  Monitor for blood recheck CBC in am --hemoglobin stable, no further bloody output appreciated -9/15 flush left nephrostomy tube 9-18: Prior right tube site continues to drain, CT abdomen and pelvis as above - 9-27: Discussed with urology exchange of nephrostomy tubes; will address as outpatient per their service  10. Acute on chronic renal failure/hyponatremia:   elevated CR- baseline creat probably ~1.4  -9/10 will give addnl IVF today d/t poor po intake   -recheck bmet in am  - 9-11: AKI significantly worse, now with hyponatremia 131.  Will give 1 L fluid bolus normal saline today, recheck BMP; given brisk uptrend despite increase in IV fluids will consult nephrology for further recommendations.  - 9-12: Appreciate nephrology input and recommendations.  Responding slightly to increased IV fluids overnight, normal saline 125/h.  Hyponatremia remains 131. On multiple medications that could be contributing including Florinef , sertraline , and linezolid .  See their note for specific recommendations; advised to remain on fluids for primary treatment of hyponatremia, do not recommend salt tabs at this time.  - If renal function does not improve with fluids may need CT scan of the abdomen and pelvis to verify patency of nephrostomy/ileal conduit drainage   9-13: Potassium chloride  packet 40 mill equivalents ordered this a.m. for repletion.  Sodium improving, bicarb improving, but renal function remains unchanged on bicarb drip.  9-14: Renal function improved, acidosis resolved, sodium 134.  Per nephrology, can DC fluids, but agreeable to maintenance fluids given ongoing poor p.o. intakes.  Continue to monitor closely.  9/15-17 Cr continues to improve gradually, continue IVF, push po  9/18: On TF, DC IVF, move to FWF 200 ccs QID--monitor closely  9-19: Seems to be tolerating free water  flushes well, continuing to improve.  Continue through this weekend.     9/21 creatinine down to 1.29/BUN 40 continue current regimen and monitor  9-22: BUN 35, creatinine 1.19.  9/23: ok to check  renal function 2x weekly, now GFR >60. DC FWF, encourage PO. --Labs in a.m.  9-25: Renal function stable, however resuming some IV fluids as below.  9/26: Encourage p.o., reducing IV fluids to 40 cc/h.  BMP tomorrow--can come off of IV fluids over this weekend if p.o. fluid intakes are  adequate  9-29: Creatinine continues to uptrend, not quite AKI.  Encourage p.o. fluids.  Overall, doing much better.  11. OSA: Encourage CPAP use 12. Hypotension/tachycardia/?adrenal insufficiency: Continue Midodrine  15 mg TID and Florinef  2 mg daily. Plasma cortisol 13.6 6/25/             --continues to have significant tachycardia with activity likely due to orthostasis and deconditioning.  - -hgb 11.5   -continue metoprolol  low dose 12.5mg  bid, bp remains soft but stable at present   -  IVF as above Per PT- has been not dropping in therapy   9-13: Orthostatic today, DC Lopressor  and increase midodrine  to 20 mg 3 times daily.  Has been getting a significant amount of IV fluid last few days, still likely volume depleted per nephrology.  9-14: Blood pressure remains stable, but soft. 9/15 hypotensive this morning.  Continue IVF and plan as above 9/16: A bit improved today; continue IVF, no signs of volume overload. If profoundly hypotensive again may need to transfer to acute given maximizing fluids/BP support without pressors.  9-18: DC IV fluid as above today; monitor blood pressures very closely, seem more stable than they were last week.  On chart review, was minimally responsive to trial of high-dose steroids in August; does not look like cosyntropin stim test was ever performed; Continue Florinef  for now, may need to consider if blood pressures do not improve 9-19: Blood pressure holding up in the low 100s, continue current regimen. 9/20-21 BP stable, continue to monitor  9/23: Stable -reduce midodrine  back to 15 mg 3 times daily--> 10 mg 3 times daily 9-24.  If tolerates well, will start reducing Florinef  next.  May need rate control agent although current tachycardia seems to be compensatory sinus tach 9-24: Continues to get tachycardic into the 150s with exertion, comes down to 90s to 110s with rest.  Discussed with cardiology, appreciate recommendations, clearly compensatory and should  not treat.  Given poor p.o. fluid intakes, will resume IV normal saline at 70 cc/h for maintenance and really encourage p.o. fluids 9-26: Looking much better today.  Reduce maintenance fluids as above.  Monitor on reduced dose of Florinef  over the weekend, and if he does well would further reduce to 0.05 mg 9-29: Reduce dose of Florinef  to 0.05 mg; monitor therapy tolerance, goal to wean off before discharge--monitor for 1 more day, then if continues to stay up will DC Florinef      08/11/2024    5:01 AM 08/10/2024    8:28 PM 08/10/2024    3:15 PM  Vitals with BMI  Systolic 111 117 874  Diastolic 66 67 75  Pulse 84 88 109     13. 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  -- resolved  9/1 held reglan   -9/16: resume reglan  given low OP, nausea--with benefit, output remains low   - 9/18: Ostomy output picking up considerably with tube feeds; on fibercon BID; appreciate nutrition input--staying appropriate   - 9/23-30: Ostomy OP appropriate; remains quite liquid but not large-volume.  Continue current regimen.  14. Depression: On Zoloft -- Dced 9/13 due to hyponatremia and confusion   - 9/16: Family concerned for serotonin syndrome with zoloft +linezolid -  no tachycardia, fevers, seizures, or severe agitation reported - encephalopathy has not improved with removal of zoloft . Low suspicion at this time.   15. Malnutrition/: Albumin  @1 .9/TP-5.1.  --> 2.2 9/11;  - 9-11: Adding Premeal Zofran  as above.  Will get nutrition to start calorie count; may need additional supplementation or discussion of feeding tube if does not improve.  - 9-12: Slightly improved, continue this regimen.  9-13: Very poor p.o. intakes yesterday.  DC Megace  due to possible cognitive effects, no perceived benefit.  Will discuss possible feeding tube with family--patient rejects, will get calorie count for 48 hours and if no improvement may readdress tube feed versus palliative consult  9-14: Wife encouraging p.o.'s,  patient motivated to avoid feeding tube.  Calorie count started.  Maintenance fluids as above.  9/16: Qtc 519; DC zofran , resume relgan 5 mg TID AC as he did well with this in the past and ostomy output has slowed considerably  9-17: QTc remains prolonged; will not resume Zofran  at this time.  Patient agreeable to placement of core track and discussed with nutrition starting with nocturnal tube feeds for supplementation, to allow appetite during the day to continue to work on increasing p.o.'s.  Will give one-time Xanax  to assist in anxiety with core track placement.  9-18: Tolerating tube feeds well, labs looking stable.  P.o. intakes poor this a.m. due to timing of therapy/medication/breakfast, continue to encourage as primary form of nutrition.  9-19: Tolerating overnight tube feeds well, p.o. intakes during the day complicated by gagging/coughing due to tube feed placement.  Chest x-ray yesterday was negative.  Orders to maintain head of bed 30 degrees whether or not tube feeds are running, and viscous lidocaine  4 times daily as needed for sensation of gagging.  9/20 nausea noted today, patient did get  dose of Compazine  and is continued on Reglan .  Will check with pharmacy to see if any new medications could be contributing.Appears he was transition to nocturnal tube feeds may be rate a little high for him will decrease to 60 mL/h overnight.  Discussed with pharmacy he got a one-time dose of Oritivancin yesterday, he should not receive any more of this as this was a one-time order  -9-22- nausea improved with current regimen--helping considerably with p.o. intakes, may be able to DC tube feeds this week.  Dietary resumed calorie count today.  9/23: Nausea resolved; pending stable PO intakes may DC reglan  this week--DC 9-26   16. Anemia of chronic illness: Stable overall 9/8-11  - Hemoglobin stable 9-10  9-22:Hemoglobin from 9.7->8.6   No obvious sources of bleeding, repeat in AM.  9/23: HgB stable  8.3--> 8.0--> 8/6       Latest Ref Rng & Units 08/10/2024    4:29 AM 08/06/2024   12:33 AM 08/04/2024    4:54 AM  CBC  WBC 4.0 - 10.5 K/uL 6.6  7.0  6.0   Hemoglobin 13.0 - 17.0 g/dL 8.6  8.0  8.3   Hematocrit 39.0 - 52.0 % 27.1  25.6  25.9   Platelets 150 - 400 K/uL 321  132  72     17.  Thrombocytopenia.  Mild, 136 today.  Monitor very closely in setting of linezolid .   - 9/19: 104; per Dr. Dennise likely adjusting linezolid  to orvitatancin starting Monday.  Continue to trend  9-22: Remains downtrending, trend.  71 today.  May take a few days to bounce back from linezolid   9/23: PLT stabilized 72; monitor  9-25: Platelets starting  to come back, 130s today.  Would be cautious to resume linezolid    9-29: Platelets normalized.  18.  Hypophosphatemia.  1.3 to 3.1 with IV repletion, next day 1.6.  - 9-19: Per electrolyte protocol, start Phos-Nak Powder via tube feed 2 packets 4 times daily for 5 doses  - Repeat daily.  -9/20 replete today, 15mmol IV,  discussed dose pharmacy  -9/21 improved to 1.9, continue replete per pharm rec   -22: 2.3, replete today, check in a.m.--repleted  19. Low MG  -9/20 replete today 2g IV,  discussed dose pharmacy  -9/21 improved today continue to monitor   -Magnesium  stable on recent labs   LOS: 31 days A FACE TO FACE EVALUATION WAS PERFORMED  Joesph JAYSON Likes 08/11/2024, 10:10 AM

## 2024-08-11 NOTE — Progress Notes (Signed)
 Speech Language Pathology Daily Session Note  Patient Details  Name: Kristopher Thompson MRN: [id number] Date of Birth: 1951/04/23  Today's Date: 08/11/2024 SLP Individual Time: 0801-0900 SLP Individual Time Calculation (min): 59 min  Short Term Goals: Week 5: SLP Short Term Goal 1 (Week 5): STGs - LTGs d/t ELOS  Skilled Therapeutic Interventions:  Patient was seen in am to address cognitive re- training. Pt was alert and seen at bedside. His wife was present and participating intermittently. Occasionally session was limited due to change of ostomy bag and wound bandage changes. Pt indep oriented to time, place, and situation including upcoming discharge from CIR program. SLP challenging pt in recall of children and grandchildren which he completed at supervision./ It should be noted that the last time this SLP saw pt he had significant difficulty with this task. SLP continued session through challenging pt in recall of today's therapy schedule after a review of information he immediately recalled with min A by conclusion of session, pt recalled therapy schedule with sup A. He was further challenged in an executive function medication management task., Her identified errors in a BID pill box missing only one error with overall supervision assist. He interpreted prescription directions presented verbally indep. In final minutes of session, recall and problem solving were continually addressed through a word list retention/ exclusion task which he completed mod I. He was challenged to recall a moderate level paragraph presented verbally with SLP guiding pt in use of repetition and association strategies which he recalled after a ~10 minute delay with 100% acc sup A. At conclusion of session, pt was left in bed with call button within reach and wife present. SLP to continue POC.   Pain Pain Assessment Pain Scale: 0-10 Pain Score: 0-No pain  Therapy/Group: Individual Therapy  [name] 08/11/2024, 8:51 AM

## 2024-08-11 NOTE — Progress Notes (Signed)
 Physical Therapy Session Note  Patient Details  Name: Kristopher Thompson MRN: [id number] Date of Birth: 08-25-1951  Today's Date: 08/11/2024 PT Individual Time: 1130-1200, 1415-1530 PT Individual Time Calculation (min): 30 min, 75 min  Short Term Goals: Week 4:  PT Short Term Goal 1 (Week 4): STGs = LTGs  Skilled Therapeutic Interventions/Progress Updates:  Session 1:  Pt received seated in WC and agrees to therapy. No complaint of pain. WC transport to gym for time management. Pt performs sit to stand throughout session with close supervision and cues for sequencing and hand placement. Pt tasked with standing without upper extremity support to challenge standing balance and focus loading through lower extremities. Prior to initial stand, PT places pulse ox on pt's finger to record HR. Pt resting at 103 bpm. Upon standing, pt's HR increases to >140 within 30 seconds, and pt sits back down due to fatigue. Seated rest break provided until pt's HR decreases to <110 bpm, which takes 2-3 minutes. Pt performed 5 sit to stand transfers w/o AD to assess and improve functional activity tolerance, given SBA to CGA for safety. When asked, pt agreed that his symptoms of fatigue recovered at the same pace his HR recovered. Pt given ample time to rest between stands, although pt appears to be recovering quicker than during previous sessions.   At end of session, pt wheeled back to room and left w/ call bell, cell phone and tray table in front ready for lunch.   Session 2:   Pt received upright in WC, agreeable to therapy and no reports of pain at this time. Pt wheeled to day room for NuStep training, targeted at improving B hip ROM. Every 2 minutes, pt chair scooted forward to increase necessary hip ROM required for reciprocal movement. Limited by knee making contact w/ L machine arm. Pt stated it felt tight but wasn't unbearable.  Pt then wheeled to main gym for therapy to improve coordination of BLE  musculature, namely hamstrings and hip flexors to improve tolerance to gait and mobility. Pt performed several sets of standing reciprocal knee flexion w/ BUE support on // bars. Pt demonstrated AROM to 90 degrees knee flexion B. Pt then performed seated unilateral hamstring curls on red yoga ball to target hamstrings, hip flexors, and hip stabilizers.  Pt wheeled to day room for final bout of gait around nurses station w/ RW and CGA solely to keep pants up. Gait performed to improve circulation after significant B hamstring exercises, improve functional activity tolerance, and decrease caregiver burden. At end of session, pt ambulated from hallway to room ~67ft where pt sat EOB. Pt required modA for BLE management into bed and modA +2 for bed mobility. Pt given call bell and had all other needs in reach.    Therapy Documentation Precautions:  Precautions Precautions: Fall (HOH, Urostomy, Colostomy, PCN  drain, and orthostatic hypotension.) Recall of Precautions/Restrictions: Impaired Precaution/Restrictions Comments: urostomy, colostomy, lt PCN drain, watch BP and HR Restrictions Weight Bearing Restrictions Per Provider Order: No   Therapy/Group: Individual Therapy  [name] 08/11/2024, 12:07 PM

## 2024-08-11 NOTE — Progress Notes (Signed)
 Occupational Therapy Session Note  Patient Details  Name: Kristopher Thompson MRN: [id number] Date of Birth: 03/26/1951  Today's Date: 08/11/2024 OT Individual Time: 8968-8884 OT Individual Time Calculation (min): 44 min    Short Term Goals: Week 5:  OT Short Term Goal 1 (Week 5): STG= LTG d/t ELOS  Skilled Therapeutic Interventions/Progress Updates:    Pt received laid back EOB presenting to be in good spirits receptive to skilled OT session reporting 0/10 pain- OT offering intermittent rest breaks, repositioning, and therapeutic support to optimize participation in therapy session. Stand pivot EOB>WC HH CGA. Transported Pt to hallway outside of main gym d/t time management. Completed 2 repetitions of walking and weaving through cones to challenge RW management and changing directions when walking, then returning to chair for rest break. Graded activity up to increase workload for final 2 repetitions. Noticeable increased fatigue and provided prolonged rest break. Completed dynamic standing balance activity alternating LEs, tap cones with each foot with CGA to increase single leg balance and crossing midline. No LOB this session, improved from day before. Transported Pt back to room via WC d/t time management. Pt was left resting in WC tilted back with call bell in reach, seatbelt alarm on, and all needs met.    Therapy Documentation Precautions:  Precautions Precautions: Fall (HOH, Urostomy, Colostomy, PCN  drain, and orthostatic hypotension.) Recall of Precautions/Restrictions: Impaired Precaution/Restrictions Comments: urostomy, colostomy, lt PCN drain, watch BP and HR Restrictions Weight Bearing Restrictions Per Provider Order: No   Therapy/Group: Individual Therapy  Cartel Mauss Van Schaick 08/11/2024, 2:42 PM

## 2024-08-12 LAB — CBC WITH DIFFERENTIAL/PLATELET
Abs Immature Granulocytes: 0.03 K/uL (ref 0.00–0.07)
Basophils Absolute: 0.1 K/uL (ref 0.0–0.1)
Basophils Relative: 1 %
Eosinophils Absolute: 0.8 K/uL — ABNORMAL HIGH (ref 0.0–0.5)
Eosinophils Relative: 9 %
HCT: 31.5 % — ABNORMAL LOW (ref 39.0–52.0)
Hemoglobin: 9.9 g/dL — ABNORMAL LOW (ref 13.0–17.0)
Immature Granulocytes: 0 %
Lymphocytes Relative: 17 %
Lymphs Abs: 1.6 K/uL (ref 0.7–4.0)
MCH: 26.2 pg (ref 26.0–34.0)
MCHC: 31.4 g/dL (ref 30.0–36.0)
MCV: 83.3 fL (ref 80.0–100.0)
Monocytes Absolute: 1 K/uL (ref 0.1–1.0)
Monocytes Relative: 11 %
Neutro Abs: 5.9 K/uL (ref 1.7–7.7)
Neutrophils Relative %: 62 %
Platelets: 471 K/uL — ABNORMAL HIGH (ref 150–400)
RBC: 3.78 MIL/uL — ABNORMAL LOW (ref 4.22–5.81)
RDW: 15.9 % — ABNORMAL HIGH (ref 11.5–15.5)
WBC: 9.6 K/uL (ref 4.0–10.5)
nRBC: 0 % (ref 0.0–0.2)

## 2024-08-12 LAB — BASIC METABOLIC PANEL WITH GFR
Anion gap: 9 (ref 5–15)
BUN: 21 mg/dL (ref 8–23)
CO2: 18 mmol/L — ABNORMAL LOW (ref 22–32)
Calcium: 9.5 mg/dL (ref 8.9–10.3)
Chloride: 110 mmol/L (ref 98–111)
Creatinine, Ser: 1.3 mg/dL — ABNORMAL HIGH (ref 0.61–1.24)
GFR, Estimated: 58 mL/min — ABNORMAL LOW (ref >60)
Glucose, Bld: 137 mg/dL — ABNORMAL HIGH (ref 70–99)
Potassium: 4.5 mmol/L (ref 3.5–5.1)
Sodium: 137 mmol/L (ref 135–145)

## 2024-08-12 LAB — C-REACTIVE PROTEIN: CRP: 1.5 mg/dL — ABNORMAL HIGH (ref <1.0)

## 2024-08-12 NOTE — Progress Notes (Signed)
 PROGRESS NOTE   Subjective/Complaints: No events overnight.  No acute complaints.  Feeling well this a.m. Vitals remained stable, blood pressure slightly soft but holding up in the low 100s to 110s.  No fevers, chills. Eating 90 to 100% of meals Ostomy output appropriate, 400 cc yesterday, p.o. fluid intakes remain very poor Urinary output decreasing  CRP up yesterday to 1.5, patient remains afebrile, will repeat CBC today and schedule another CRP for a.m.  ROS: Patient denies fever, rash, sore throat, blurred vision, dizziness,  , vomiting, diarrhea, cough, shortness of breath or chest pain, joint or back/neck pain, headache, or mood change.     Objective:   No results found.     Recent Labs    08/10/24 0429  WBC 6.6  HGB 8.6*  HCT 27.1*  PLT 321     Recent Labs    08/10/24 0429  NA 141  K 4.1  CL 114*  CO2 17*  GLUCOSE 103*  BUN 21  CREATININE 1.25*  CALCIUM  8.7*    Intake/Output Summary (Last 24 hours) at 08/12/2024 0832 Last data filed at 08/11/2024 1545 Gross per 24 hour  Intake 120 ml  Output 500 ml  Net -380 ml        Physical Exam: Vital Signs Blood pressure 110/64, pulse 82, temperature 98.3 F (36.8 C), temperature source Oral, resp. rate 16, height 5' 11 (1.803 m), weight 105 kg, SpO2 100%.   Constitutional: No acute distress.  Sitting upright at bedside. HEENT: NCAT, EOMI, oral membranes moist.    Neck: supple, no JVD. Cardiovascular: RRR without murmur.  1+ bilateral upper extremity and lower extremity edema.--Improving Respiratory/Chest: CTA Bilaterally without wheezes or rales. Normal efforrt.  GI/Abdomen: soft, non-tender, non-distended.  Positive bowel sounds ostomy with liquid light brown stool- Ext: no clubbing, cyanosis. Psych: Appropriate mood and affect.  Increasingly dynamic.  Appropriate  Skin:  abdominal wound  with pink with granulation tissue, moist, covered in  clean dressing. --Improving, stable appearance Left nephrostomy tube with bag seal in place.,  Draining clear urine--Unchanged R flank draining serosanguineous fluid--continues serous drainage, nontender or indurated 10-1 Urostomy with  mostly clear output, some white sediment. --Unchanged Ileostomy well-circumscribed, underlying pink healthy tissue--unchanged, with dark brown output--unchanged  Neuro:   AAO.  Oriented to person, place, time, and event.  No apparent cognitive deficits.    +HOH Normal language and speech.  Cranial nerve exam unremarkable.  MMT: 5- out of 5 bilateral upper and lower extremities. No limb ataxia or cerebellar signs.  Bilateral upper and lower extremity tremors with intention, very slight.  Musculoskeletal: No apparent deformities.  Assessment/Plan: 1. Functional deficits which require 3+ hours per day of interdisciplinary therapy in a comprehensive inpatient rehab setting. Physiatrist is providing close team supervision and 24 hour management of active medical problems listed below. Physiatrist and rehab team continue to assess barriers to discharge/monitor patient progress toward functional and medical goals  Care Tool:  Bathing    Body parts bathed by patient: Chest, Face, Abdomen, Right arm, Left arm, Front perineal area, Right upper leg, Left upper leg   Body parts bathed by helper: Buttocks, Right lower leg, Left lower leg  Bathing assist Assist Level: Moderate Assistance - Patient 50 - 74%     Upper Body Dressing/Undressing Upper body dressing   What is the patient wearing?: Hospital gown only    Upper body assist Assist Level: Moderate Assistance - Patient 50 - 74%    Lower Body Dressing/Undressing Lower body dressing      What is the patient wearing?: Pants     Lower body assist Assist for lower body dressing: Maximal Assistance - Patient 25 - 49%     Toileting Toileting Toileting Activity did not occur (Clothing management  and hygiene only): N/A (no void or bm)  Toileting assist Assist for toileting: Dependent - Patient 0%     Transfers Chair/bed transfer  Transfers assist  Chair/bed transfer activity did not occur: Safety/medical concerns  Chair/bed transfer assist level: Moderate Assistance - Patient 50 - 74%     Locomotion Ambulation   Ambulation assist   Ambulation activity did not occur: Safety/medical concerns (fatigue/dizziness/orthostatics)          Walk 10 feet activity   Assist  Walk 10 feet activity did not occur: Safety/medical concerns (fatigue/dizziness/orthostatics)        Walk 50 feet activity   Assist Walk 50 feet with 2 turns activity did not occur: Safety/medical concerns (fatigue/dizziness/orthostatics)         Walk 150 feet activity   Assist Walk 150 feet activity did not occur: Safety/medical concerns (fatigue/dizziness/orthostatics)         Walk 10 feet on uneven surface  activity   Assist Walk 10 feet on uneven surfaces activity did not occur: Safety/medical concerns (fatigue/dizziness/orthostatics)         Wheelchair     Assist Is the patient using a wheelchair?: Yes Type of Wheelchair: Manual           Wheelchair 50 feet with 2 turns activity    Assist    Wheelchair 50 feet with 2 turns activity did not occur: Safety/medical concerns       Wheelchair 150 feet activity     Assist  Wheelchair 150 feet activity did not occur: Safety/medical concerns       Blood pressure 110/64, pulse 82, temperature 98.3 F (36.8 C), temperature source Oral, resp. rate 16, height 5' 11 (1.803 m), weight 105 kg, SpO2 100%. Medical Problem List and Plan: 1. 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  Undergoing medical workups as below, will monitor improvement for the next couple of days --address at teams if current level of care exceeds ability of home caregivers.  - 9-13: Bilateral lower extremity PRAFO's in bed for plantarflexion/tight heel cords  --continue CIR therapies including PT, OT, and SLP. Progress has been very slow. May need to consider other discharge venue   - 9/16: Declining with therapies; pleasantly confused today, interactions much less withdrawn but nonsensical. Will need Mod-Max A at home. Concern for physical decline exceeding family ability for caregiving if no improvements moving forward.   - 9-17 waiting on calorie counts today to determine if feeding tube needed; if patient/family continue to refuse, would recommend goals of care discussion and possible palliative care consult--patient opted  for placement of tube feed  - 9-18: Due to ongoing significant support needed to maintain blood pressure, as well as chills today, discussed additional workup/treatment with infectious disease; recommend against treatment of yeast at this time given likely contamination, given ongoing decline will get lactic acid and repeat CT abdomen and pelvis without contrast in a.m--lactic acid upper limit of normal 1.9, CT abdomen and pelvis results -right ventral pelvic wall fluid collection not changed in size from prior study, distal colonic diverticulosis, small bilateral pleural effusions and dependent lower lobe atelectasis, IR consulted yesterday but holding off on drainage of fluid collection  - . Repeat imaging performed yesterday and shows stable fluid collection in the abdominal wall. Patient without elevated WBC, remains afebrile, collection appears similar in size and nature. Per Dr. Jenna, no aspiration or drainage planned for now.    -  9/23: Wife continues to do all cares - doing walking trials with OT to work on endurance and transfers,  remains Mod-Max A to stand but once on his feet can be Min A 46 feet. PT Mod-Max A sit to stand. PO intakes much improved last 2 days.  Likely stedy; has home hospital bed without power lift so will check height for transfers.   - Family meeting today: Est Min-Mod A at home, Wife to work with patient closely over the next week to determine if able to handle needs at home.  - 9/30: Emerging Mod I cog, D3 with thins for dysphagia.  Anticipate supervision level at discharge.  Doing very well    2.  H/o PE/Antithrombotics: -DVT/anticoagulation:  Pharmaceutical: continue Eliquis              -antiplatelet therapy: N/A  - No further blood appreciated in nephrostomy tubes  3. Pain Management: Oxycodone  prn.    - 9/16: Oxycodone  was removed yesterday due to concerns of this contributing to PM confusion and hypotension; nursing reports family in disagreement with this and patient pain increased. Adjust to Tylenol  PRN for mild to moderate, Percocet 5 mg for severe pain to avoid undertreatment. Monitor BP VERY closely  9-18: Has not required Percocet again since 9-15  9/21 patient reports pain is overall under control  4. Mood/Behavior/Sleep: LCSW to follow for evaluation and support.              --melatonin for insomnia.              -antipsychotic agents: N/A  9-14: Per patient, sleeping well.  Sleep log.  Discussed delirium management with patient's wife  - 9-18 sleep log remains appropriate, no apparent hallucinations or delusions.  Continue current management  5. Neuropsych/cognition/encephalopathy likely secondary to metabolic acidosis vs delirium from prolonged illness/hospitalization: This patient is not capable of making decisions on his own behalf.  - 9-11: Increasingly withdrawn, behavioral changes may be secondary to poor nutritional intake and hyponatremia.  Treating as below, on sertraline  100 mg daily, may need to add additional agent such as mirtazapine but hesitant given ongoing  cognitive issues.  9-12: Slightly better today, but consistent with hypoactive delirium with primary difficulty with concentration, no focal neurologic deficits.  Start overnight sleep log, delirium precautions.  Suspect hyponatremia as primary contributor, management as below.  9/13: D3C Flexeril , Megace , Zoloft  due to ongoing cognitive changes.  Sodium is improving.  Get LFTs, ammonia today to evaluate ongoing cognitive deficits--within normal limits.  -14: Cognition seems to be clearing a bit, continue current regimen.   9/15 alert but delayed. Continue to maximize  renal/volume/nutritional status 9/16: Much more alert, but delirious; sleep log appears appropriate. Will add Zyprexa  5 mg PRN for aggitation d/t increased aggitation yesterday afternoon. Keep low dose / low likelihood of Qtc prolongation medications only.   9-18: Cognition improving very gradually, QTc remains prolonged.  DC zyprexa  due to QTC   9-22: Doing much better this week.  Continue with adequate nutrition, hydration    6. Skin/Wound Care: Routine pressure relief measures. Encourage adequate diet             --dressing changes daily. Continue Gerhardt's cream to buttock wound.   -abdominal wound continues to close despite tenous nutritional status   - 9-30: Discussed with patient, will continue PICC until discharge due to difficult sticks and possibility of resuming antibiotics  7. Fluids/Electrolytes/Nutrition/oropharyngeal dysphagia/moderate to severe protein calorie malnutrition:                --9/10 periactin  yesterday stopped d/t lethargy   -will try megace  beginning today   -RD f/u   -push fluids   -recheck bmet in am   -continue juven bid. Has been refusing Ensure supplement.   -kdur 20meq daily   -can't use zofran  d/t QTc prolongation (490)  - MBS   9/3--D3/thins, prealbumin borderline normal at 20   - 9-11: Nausea remains a primary barrier to adequate p.o. intakes.  Recheck EKG today, QTc was 460, scheduled  Zofran  4 mg IV 3 times daily AC.  Will repeat EKG in next 1 to 2 days.  - 9-12: Albumin  infusion per nephrology.  P.o. intakes improving with Zofran .  per IV team midline contraindicated due to bicarb.  May get PICC line once dialysis no longer considered.  9-14: P.o. intakes okay, Zofran  continues to help with nausea.  Per nephrology, okay to reduce fluids, will keep on 70 cc/h normal saline for maintenance for now.  PICC line order placed.  9/15 intake 20-30%. Receiving mtc IVF.    -k+ 3.3, mg++ 1.3   -continue kdur 20meq daily and add 40meq to IVF   -IV mag sulfae 2g x1, add mag gluconate oral daily    9/16: Mag, K repleted. PO intakes remain very poor. Albumin  2.0. Continue maintenance fluids--will likely need TF.   9/17: Ad phosnak 1 packet x3 doses for repletion of K and Phos; repeat in AM--nephrology changed to IV--repleted 9-18  9-22: Nutrition doing much better, eating 50 to 100% of meals.  Replete electrolytes as below. 9/23: Finishing calorie count per SLP today - PO intakes MUCH better - hopeful to remove cortrak tube otherwise will get IR consult to evaluate if PEG possible 9-24: Dietary stating patient is to keep up with 50% of his energy needs, can consider removing core track tube.  Will wait for labs in a.m. to ensure nutrition and hydration continue to be adequate before removing; did just DC free water  flushes and IV fluids 9-25: Core track DC'd, patient labs look okay.  Really encourage p.o. fluids, resuming IV maintenance as below due to tachycardia but should really push p.o. intakes this week. 9-26: P.o. intakes really good, bring maintenance fluids down to 40 cc/h, BMP and Mag in AM 9-29: Creatinine slightly up, encourage p.o. fluids, but not quite at AKI level yet.  Other labs are stable.  Magnesium  holding up.  8. Sepsis/Abdominal wall/mesh infection: IV Vanc/Ceftriaxone  for 6 weeks from 08/21 (10/2)             --Probable 6 months total of antibiotics  --Corynebacterium  species-->6 more  weeks ?Dalbavancin for OP use, cont ceftriazone and linezolid  for now  per ID -appreciate recent ID f/u CRP nl 0.8 ESR 44 WBC 8.2 9-11: WBC labs remained stable. 9/15 wbc 7.1 9/16: Discussed with Dr. Dennise ID possibility of encephalopathy with current antibiotics, as well as +UA yesterday. Recommendation to continue current broad spectrum antibiotics as they are unlikely contributors and regimen has already been altered from vancomycin . Given some yeast in UA will await culture to see if secondary fungal infection present vs contaminant. -pending 9/17 9/18; -LA 1.9, CT abdomen and pelvis results  --see above 9-22: Per Dr Dennise,  then continue oritavancin  and ceftriaxone  till 10/2.  After which can do 6 more weeks on omdada+dalvance/tedezolid.  Continue Rocephin .-- will double check with ID on this timeline 9/23 9-24: Infectious disease, pharmacy working with family today on discharge antibiotic regimen from a cost intolerance perspective; I agree with trialing his home antibiotic regimen in the hospital to ensure that nausea does not limit him again 9-25: Extensive discussions with infectious disease Dr. Overton and Dr. Sheldon today; feel mesh infection is less likely, decision to remove antibiotics and monitor closely over the next week.  Patient will get CT abdomen close to discharge to reevaluate size of abscess (would order 10/2).  CRPs every 72 hours and other labs for monitoring per ID.  Patient and family understanding and agreeable with this. 10-1: CRP is up to 1.5, WBC up from 6-9, discussed with infectious disease.  Will follow-up on CT abdomen and pelvis in a.m.  No new recommendations.  9. Left> right hydronephrosis: Nephrostomy tubes with stent exchanged on 08/27 --continue clamping right tube and left to drain per urology.  Monitor for blood recheck CBC in am --hemoglobin stable, no further bloody output appreciated -9/15 flush left nephrostomy tube 9-18: Prior right tube  site continues to drain, CT abdomen and pelvis as above - 9-27: Discussed with urology exchange of nephrostomy tubes; will address as outpatient per their service  10. Acute on chronic renal failure/hyponatremia:  elevated CR- baseline creat probably ~1.4  -9/10 will give addnl IVF today d/t poor po intake   -recheck bmet in am  - 9-11: AKI significantly worse, now with hyponatremia 131.  Will give 1 L fluid bolus normal saline today, recheck BMP; given brisk uptrend despite increase in IV fluids will consult nephrology for further recommendations.  - 9-12: Appreciate nephrology input and recommendations.  Responding slightly to increased IV fluids overnight, normal saline 125/h.  Hyponatremia remains 131. On multiple medications that could be contributing including Florinef , sertraline , and linezolid .  See their note for specific recommendations; advised to remain on fluids for primary treatment of hyponatremia, do not recommend salt tabs at this time.  - If renal function does not improve with fluids may need CT scan of the abdomen and pelvis to verify patency of nephrostomy/ileal conduit drainage   9-13: Potassium chloride  packet 40 mill equivalents ordered this a.m. for repletion.  Sodium improving, bicarb improving, but renal function remains unchanged on bicarb drip.  9-14: Renal function improved, acidosis resolved, sodium 134.  Per nephrology, can DC fluids, but agreeable to maintenance fluids given ongoing poor p.o. intakes.  Continue to monitor closely.  9/15-17 Cr continues to improve gradually, continue IVF, push po  9/18: On TF, DC IVF, move to FWF 200 ccs QID--monitor closely  9-19: Seems to be tolerating free water  flushes well, continuing to improve.  Continue through this weekend.     9/21 creatinine down to 1.29/BUN  40 continue current regimen and monitor  9-22: BUN 35, creatinine 1.19.  9/23: ok to check renal function 2x weekly, now GFR >60. DC FWF, encourage PO. --Labs in  a.m.  9-25: Renal function stable, however resuming some IV fluids as below.  9/26: Encourage p.o., reducing IV fluids to 40 cc/h.  BMP tomorrow--can come off of IV fluids over this weekend if p.o. fluid intakes are adequate  9-29: Creatinine continues to uptrend, not quite AKI.  Encourage p.o. fluids.  Overall, doing much better.  10-1: Repeat labs today, as fluid intakes have been minimal.  Blood pressure downtrending--slight uptrend in creatinine, really encouraging patient to drink fluid!  Goal 6 to 8  250 cc cups per day.  11. OSA: Encourage CPAP use 12. Hypotension/tachycardia/?adrenal insufficiency: Continue Midodrine  15 mg TID and Florinef  2 mg daily. Plasma cortisol 13.6 6/25/             --continues to have significant tachycardia with activity likely due to orthostasis and deconditioning.  - -hgb 11.5   -continue metoprolol  low dose 12.5mg  bid, bp remains soft but stable at present   -  IVF as above Per PT- has been not dropping in therapy   9-13: Orthostatic today, DC Lopressor  and increase midodrine  to 20 mg 3 times daily.  Has been getting a significant amount of IV fluid last few days, still likely volume depleted per nephrology.  9-14: Blood pressure remains stable, but soft. 9/15 hypotensive this morning.  Continue IVF and plan as above 9/16: A bit improved today; continue IVF, no signs of volume overload. If profoundly hypotensive again may need to transfer to acute given maximizing fluids/BP support without pressors.  9-18: DC IV fluid as above today; monitor blood pressures very closely, seem more stable than they were last week.  On chart review, was minimally responsive to trial of high-dose steroids in August; does not look like cosyntropin stim test was ever performed; Continue Florinef  for now, may need to consider if blood pressures do not improve 9-19: Blood pressure holding up in the low 100s, continue current regimen. 9/20-21 BP stable, continue to monitor  9/23:  Stable -reduce midodrine  back to 15 mg 3 times daily--> 10 mg 3 times daily 9-24.  If tolerates well, will start reducing Florinef  next.  May need rate control agent although current tachycardia seems to be compensatory sinus tach 9-24: Continues to get tachycardic into the 150s with exertion, comes down to 90s to 110s with rest.  Discussed with cardiology, appreciate recommendations, clearly compensatory and should not treat.  Given poor p.o. fluid intakes, will resume IV normal saline at 70 cc/h for maintenance and really encourage p.o. fluids 9-26: Looking much better today.  Reduce maintenance fluids as above.  Monitor on reduced dose of Florinef  over the weekend, and if he does well would further reduce to 0.05 mg 9-29: Reduce dose of Florinef  to 0.05 mg; monitor therapy tolerance, goal to wean off before discharge--monitor for 1 more day, then if continues to stay up will DC Florinef  10-1: DC Florinef --monitor closely     08/12/2024    5:10 AM 08/12/2024    5:00 AM 08/11/2024    7:45 PM  Vitals with BMI  Weight  231 lbs 8 oz   Systolic 110  104  Diastolic 64  74  Pulse 82  86     13. 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  -- resolved  9/1 held reglan   -9/16: resume reglan  given low OP,  nausea--with benefit, output remains low   - 9/18: Ostomy output picking up considerably with tube feeds; on fibercon BID; appreciate nutrition input--staying appropriate   - 9/23-10/1: Ostomy OP appropriate; remains quite liquid but not large-volume.  Continue current regimen.   14. Depression: On Zoloft -- Dced 9/13 due to hyponatremia and confusion   - 9/16: Family concerned for serotonin syndrome with zoloft +linezolid - no tachycardia, fevers, seizures, or severe agitation reported - encephalopathy has not improved with removal of zoloft . Low suspicion at this time.   15. Malnutrition/: Albumin  @1 .9/TP-5.1.  --> 2.2 9/11;  - 9-11: Adding Premeal Zofran  as above.  Will get nutrition to  start calorie count; may need additional supplementation or discussion of feeding tube if does not improve.  - 9-12: Slightly improved, continue this regimen.  9-13: Very poor p.o. intakes yesterday.  DC Megace  due to possible cognitive effects, no perceived benefit.  Will discuss possible feeding tube with family--patient rejects, will get calorie count for 48 hours and if no improvement may readdress tube feed versus palliative consult  9-14: Wife encouraging p.o.'s, patient motivated to avoid feeding tube.  Calorie count started.  Maintenance fluids as above.  9/16: Qtc 519; DC zofran , resume relgan 5 mg TID AC as he did well with this in the past and ostomy output has slowed considerably  9-17: QTc remains prolonged; will not resume Zofran  at this time.  Patient agreeable to placement of core track and discussed with nutrition starting with nocturnal tube feeds for supplementation, to allow appetite during the day to continue to work on increasing p.o.'s.  Will give one-time Xanax  to assist in anxiety with core track placement.  9-18: Tolerating tube feeds well, labs looking stable.  P.o. intakes poor this a.m. due to timing of therapy/medication/breakfast, continue to encourage as primary form of nutrition.  9-19: Tolerating overnight tube feeds well, p.o. intakes during the day complicated by gagging/coughing due to tube feed placement.  Chest x-ray yesterday was negative.  Orders to maintain head of bed 30 degrees whether or not tube feeds are running, and viscous lidocaine  4 times daily as needed for sensation of gagging.  9/20 nausea noted today, patient did get  dose of Compazine  and is continued on Reglan .  Will check with pharmacy to see if any new medications could be contributing.Appears he was transition to nocturnal tube feeds may be rate a little high for him will decrease to 60 mL/h overnight.  Discussed with pharmacy he got a one-time dose of Oritivancin yesterday, he should not receive  any more of this as this was a one-time order  -9-22- nausea improved with current regimen--helping considerably with p.o. intakes, may be able to DC tube feeds this week.  Dietary resumed calorie count today.  9/23: Nausea resolved; pending stable PO intakes may DC reglan  this week--DC 9-26   16. Anemia of chronic illness: Stable overall 9/8-11  - Hemoglobin stable 9-10  9-22:Hemoglobin from 9.7->8.6   No obvious sources of bleeding, repeat in AM.  9/23: HgB stable 8.3--> 8.0--> 8.6-->9.9       Latest Ref Rng & Units 08/12/2024   11:02 AM 08/10/2024    4:29 AM 08/06/2024   12:33 AM  CBC  WBC 4.0 - 10.5 K/uL 9.6  6.6  7.0   Hemoglobin 13.0 - 17.0 g/dL 9.9  8.6  8.0   Hematocrit 39.0 - 52.0 % 31.5  27.1  25.6   Platelets 150 - 400 K/uL 471  321  132  17.  Thrombocytopenia.  Mild, 136 today.  Monitor very closely in setting of linezolid .   - 9/19: 104; per Dr. Dennise likely adjusting linezolid  to orvitatancin starting Monday.  Continue to trend  9-22: Remains downtrending, trend.  71 today.  May take a few days to bounce back from linezolid   9/23: PLT stabilized 72; monitor  9-25: Platelets starting to come back, 130s today.  Would be cautious to resume linezolid    9-29: Platelets normalized.  10-1: Platelets no uptrending, repeat labs in AM.  18.  Hypophosphatemia.  1.3 to 3.1 with IV repletion, next day 1.6.  - 9-19: Per electrolyte protocol, start Phos-Nak Powder via tube feed 2 packets 4 times daily for 5 doses  - Repeat daily.  -9/20 replete today, 15mmol IV,  discussed dose pharmacy  -9/21 improved to 1.9, continue replete per pharm rec   -22: 2.3, replete today, check in a.m.--repleted  19. Low MG  -9/20 replete today 2g IV,  discussed dose pharmacy  -9/21 improved today continue to monitor   -Magnesium  stable on recent labs   LOS: 32 days A FACE TO FACE EVALUATION WAS PERFORMED  Kristopher Thompson 08/12/2024, 8:32 AM

## 2024-08-12 NOTE — Progress Notes (Signed)
 Physical Therapy Session Note  Patient Details  Name: Kristopher Thompson MRN: [id number] Date of Birth: 1951/10/29  Today's Date: 08/12/2024 PT Individual Time: 9054-8954, 1300-1330 PT Individual Time Calculation (min): 60 min, 30 min   Short Term Goals: Week 4:  PT Short Term Goal 1 (Week 4): STGs = LTGs  Skilled Therapeutic Interventions/Progress Updates:   AM session: Pt received seated in wc, agreeable to therapy and w/o pain. Pt wheeled to main gym hallway for gait training. Pt ambulated 125ft, 116ft, 172ft, 60ft w/ RW and SBA to improve pt endurance and decrease caregiver burden. Pt given ample time between bouts of gait for seated rest breaks. Pt wheeled outside for seated rest break to improve pt affect/mood and continue community reintegration. Discussed pt hobbies and favorite stories growing up. Pt wheeled to day room and performed standing ball toss w/ SBA, catching and throwing w/ BUE. At end of session, pt wheeled back to room and given all needs within reach.   PM session: Pt received seated in wc and agreeable to therapy. No c/o pain. Pt wheeled to day room and performed heel slides on yoga ball, progressed to heel slides w/ pillow case on floor, given yellow TB resistance to increase difficulty. Performed to improve posterior chain musculature to improve functional activity tolerance. Performed in a circuit w/ seated resisted hip abduction w/ yellow TB. Circuit performed to improve pt endurance in functional activities. At end of session, pt wheeled back to room and given all needs within reach.   Therapy Documentation Precautions:  Precautions Precautions: Fall Recall of Precautions/Restrictions: Impaired Precaution/Restrictions Comments: urostomy, ileostomy, L nephrostomy drain Restrictions Weight Bearing Restrictions Per Provider Order: No   Therapy/Group: Individual Therapy  [name] 08/12/2024, 4:10 PM

## 2024-08-12 NOTE — Plan of Care (Signed)
  Problem: Consults Goal: RH GENERAL PATIENT EDUCATION Description: See Patient Education module for education specifics. Outcome: Progressing   Problem: RH BOWEL ELIMINATION Goal: RH STG MANAGE BOWEL WITH ASSISTANCE Description: STG Manage Bowel with supervision Assistance. Outcome: Progressing   Problem: RH BLADDER ELIMINATION Goal: RH STG MANAGE BLADDER WITH ASSISTANCE Description: STG Manage Bladder With supervision Assistance Outcome: Progressing   Problem: RH SKIN INTEGRITY Goal: RH STG SKIN FREE OF INFECTION/BREAKDOWN Description: Manage skin free of infection with supervision assistance Outcome: Progressing   Problem: RH SAFETY Goal: RH STG ADHERE TO SAFETY PRECAUTIONS W/ASSISTANCE/DEVICE Description: STG Adhere to Safety Precautions With Assistance/Device. Outcome: Progressing   Problem: RH PAIN MANAGEMENT Goal: RH STG PAIN MANAGED AT OR BELOW PT'S PAIN GOAL Description: <4 w/ prns Outcome: Progressing   Problem: RH KNOWLEDGE DEFICIT GENERAL Goal: RH STG INCREASE KNOWLEDGE OF SELF CARE AFTER HOSPITALIZATION Description: Manage increase knowledge of self care care after hospitalization with supervision assistance from wife using educational materials provided Outcome: Progressing

## 2024-08-12 NOTE — Progress Notes (Signed)
 Occupational Therapy Discharge Summary  Patient Details  Name: Kristopher Thompson MRN: [id number] Date of Birth: [birth year]  Date of Discharge from OT service:August 13, 2024   Patient has met 7 of 7 long term goals due to improved activity tolerance, improved balance, postural control, ability to compensate for deficits, improved awareness, and improved coordination.  Patient to discharge at overall Supervision level.  Patient's care partner is independent to provide the necessary physical assistance at discharge. Kristopher Thompson has an incredible turn around and is leaving CIR at a (S) level after beginning at a max A +2 level for all ADLs and transfers. He is motivated to continue improving and working hard toward his recovery.   Recommendation:  Patient will benefit from ongoing skilled OT services in outpatient setting to continue to advance functional skills in the area of BADL and iADL.  Equipment: No equipment provided  Reasons for discharge: treatment goals met and discharge from hospital  Patient/family agrees with progress made and goals achieved: Yes  OT Discharge Precautions/Restrictions  Precautions Precautions: Fall Precaution/Restrictions Comments: urostomy, ileostomy, L nephrostomy drain Restrictions Weight Bearing Restrictions Per Provider Order: No Pain Pain Assessment Pain Scale: 0-10 Pain Score: 0-No pain ADL ADL Eating: Set up Where Assessed-Eating: Bed level Grooming: Setup Where Assessed-Grooming: Edge of bed Upper Body Bathing: Supervision/safety Where Assessed-Upper Body Bathing: Edge of bed Lower Body Bathing: Contact guard Where Assessed-Lower Body Bathing: Edge of bed Upper Body Dressing: Supervision/safety Where Assessed-Upper Body Dressing: Sitting at sink Lower Body Dressing: Contact guard Where Assessed-Lower Body Dressing: Bed level (based on observation with transferring from supine to L and R) Toileting: Unable to assess Where Assessed-Toileting:  Bed level Toilet Transfer: Not assessed Toilet Transfer Method: Not assessed Toilet Transfer Equipment: Other (comment) (medically not safe at this time.) Tub/Shower Transfer: Not assessed Tub/Shower Transfer Method: Unable to assess (change in posiition resulted in decrease HR) Tub/Shower Equipment: Other (comment) Walk-In Shower Transfer: Unable to assess Film/video editor Method: Unable to assess Astronomer: Other (comment) (Medically not safe at this time secondry to decrease HR coming from supine to sitting EOB) ADL Comments: Patient requires additional assessing regarding funcitonal transfer to all surfaces secondry to not bed medically safe with change in position from supine to EOB. Vision Baseline Vision/History: 1 Wears glasses Patient Visual Report: No change from baseline Vision Assessment?: No apparent visual deficits Perception  Perception: Within Functional Limits Praxis Praxis: WFL Cognition Cognition Overall Cognitive Status: Impaired/Different from baseline Arousal/Alertness: Awake/alert Orientation Level: Place;Person;Situation Person: Oriented Place: Oriented Situation: Oriented Memory: Appears intact Problem Solving: Appears intact Safety/Judgment: Appears intact Comments: Pt with slower processing but overall functional during session Brief Interview for Mental Status (BIMS) Repetition of Three Words (First Attempt): 3 Temporal Orientation: Year: Correct Temporal Orientation: Month: Accurate within 5 days Temporal Orientation: Day: Correct BIMS Summary Score: 99 Sensation Sensation Light Touch: Appears Intact Peripheral sensation comments: Reports numbness occasionally in tops of feet Coordination Gross Motor Movements are Fluid and Coordinated: No Coordination and Movement Description: Incredible improvement since eval but still with significant deconditioning that impacts coordination Motor  Motor Motor: Other (comment) Motor  - Discharge Observations: Deconditioning Mobility  Bed Mobility Bed Mobility: Rolling Right;Rolling Left;Supine to Sit Rolling Right: Supervision/verbal cueing Rolling Left: Supervision/Verbal cueing Right Sidelying to Sit: Supervision/Verbal cueing Supine to Sit: Supervision/Verbal cueing Sit to Supine: Supervision/Verbal cueing Transfers Sit to Stand: Supervision/Verbal cueing Stand to Sit: Supervision/Verbal cueing  Trunk/Postural Assessment  Cervical Assessment Cervical Assessment: Exceptions to [facility] (forward head) Thoracic Assessment  Thoracic Assessment: Exceptions to [facility] (rounded shoulders) Lumbar Assessment Lumbar Assessment: Exceptions to [facility] (posterior pelvic tilt) Postural Control Postural Control: Deficits on evaluation Righting Reactions: dealyed, inadequate  Balance Balance Balance Assessed: Yes Static Sitting Balance Static Sitting - Balance Support: Feet supported Static Sitting - Level of Assistance: 6: Modified independent (Device/Increase time) Dynamic Sitting Balance Dynamic Sitting - Balance Support: Feet supported Dynamic Sitting - Level of Assistance: 5: Stand by assistance Static Standing Balance Static Standing - Balance Support: During functional activity;Bilateral upper extremity supported Static Standing - Level of Assistance: 5: Stand by assistance Dynamic Standing Balance Dynamic Standing - Balance Support: During functional activity;Bilateral upper extremity supported Dynamic Standing - Level of Assistance: 5: Stand by assistance Extremity/Trunk Assessment RUE Assessment RUE Assessment: Exceptions to [facility] Active Range of Motion (AROM) Comments: full AROM now General Strength Comments: 3+/5 LUE Assessment LUE Assessment: Exceptions to [facility] [employer] Active Range of Motion (AROM) Comments: Full AROM now General Strength Comments: 3+/5   Kristopher Thompson 08/12/2024, 11:39 AM

## 2024-08-12 NOTE — Progress Notes (Signed)
 Occupational Therapy Session Note  Patient Details  Name: Kristopher Thompson MRN: [id number] Date of Birth: January 03, 1951  Session 1: Today's Date: 08/12/2024 OT Individual Time: 1105-1200 OT Individual Time Calculation (min): 55 min   Session 2:  Today's Date: 08/12/2024 OT Individual Time: 8597-8496 OT Individual Time Calculation (min): 61 min   Short Term Goals: Week 5:  OT Short Term Goal 1 (Week 5): STG= LTG d/t ELOS  Skilled Therapeutic Interventions/Progress Updates:    Session 1: Pt received resting in WC presenting to be in good spirits receptive to skilled OT session reporting 0/10 pain- OT offering intermittent rest breaks, repositioning, and therapeutic support to optimize participation in therapy session. Transported Pt via WC to main gym d/t time management. Focused this session on UE strengthening and standing tolerance/balance with no AE. Pt completed 2 sets of 10 repetitions of bicep curls, chest press, and shoulder press with 5# weighted dowel and CGA for balance and safety, intermittent MIN A to maintain balance d/t fatigue at the end. Ambulated approximately 40 ft back to room utilizing RW and CGA for safety. Increased fatigue noticed throughout this session, Pt report d/t PT session before. Pt was left resting in [facility] with call bell in reach, wife present, and all needs met.   Session 2: Pt received resting in WC presenting to be in good spirits receptive to skilled OT session reporting 0/10 pain- OT offering intermittent rest breaks, repositioning, and therapeutic support to optimize participation in therapy session. Transported Pt to main rehab gym via [facility] d/t time management. Completed dynamic standing balance activity by having Pt reach across body to grab horseshoe and tossing it to target x16 with CGA. One instance of LOB, Pt able to correct with MOD A for safety. Downgraded activity d/t fatigue and provided education on RW management at home. Pt completed pill box  organization activity to challenge fine motor skills and cognition MOD I with no errors. Provided Pt HEP and education on following:  Exercises - Supine Scapular Protraction in Flexion with Dumbbells  - 1 x daily - 7 x weekly - 3 sets - 10 reps - Supine Shoulder Horizontal Abduction with Dumbbells  - 1 x daily - 7 x weekly - 3 sets - 10 reps - Seated Overhead Press with Dumbbells  - 1 x daily - 7 x weekly - 3 sets - 10 reps - Seated Chest Press with Dumbbells  - 1 x daily - 7 x weekly - 3 sets - 10 reps - Seated Triceps Extension with Dumbbell Single Arm  - 1 x daily - 7 x weekly - 3 sets - 10 reps - Seated Single Arm Elbow Flexion with Dumbbell  - 1 x daily - 7 x weekly - 3 sets - 10 reps  Pt was receptive to HEP and teach back utilized as evidence of learning. Pt transported back to room via WC d/t time management. Pt was left resting in [facility] with call bell in reach, wife present, and all needs met.   Therapy Documentation Precautions:  Precautions Precautions: Fall Recall of Precautions/Restrictions: Impaired Precaution/Restrictions Comments: urostomy, ileostomy, L nephrostomy drain Restrictions Weight Bearing Restrictions Per Provider Order: No  Therapy/Group: Individual Therapy  Keo Schirmer Van Schaick 08/12/2024, 12:57 PM

## 2024-08-13 ENCOUNTER — Inpatient Hospital Stay (HOSPITAL_COMMUNITY)

## 2024-08-13 LAB — BASIC METABOLIC PANEL WITH GFR
Anion gap: 9 (ref 5–15)
BUN: 23 mg/dL (ref 8–23)
CO2: 17 mmol/L — ABNORMAL LOW (ref 22–32)
Calcium: 9.1 mg/dL (ref 8.9–10.3)
Chloride: 112 mmol/L — ABNORMAL HIGH (ref 98–111)
Creatinine, Ser: 1.35 mg/dL — ABNORMAL HIGH (ref 0.61–1.24)
GFR, Estimated: 56 mL/min — ABNORMAL LOW (ref >60)
Glucose, Bld: 86 mg/dL (ref 70–99)
Potassium: 4.1 mmol/L (ref 3.5–5.1)
Sodium: 138 mmol/L (ref 135–145)

## 2024-08-13 LAB — CBC
HCT: 27.4 % — ABNORMAL LOW (ref 39.0–52.0)
Hemoglobin: 8.7 g/dL — ABNORMAL LOW (ref 13.0–17.0)
MCH: 26.6 pg (ref 26.0–34.0)
MCHC: 31.8 g/dL (ref 30.0–36.0)
MCV: 83.8 fL (ref 80.0–100.0)
Platelets: 364 K/uL (ref 150–400)
RBC: 3.27 MIL/uL — ABNORMAL LOW (ref 4.22–5.81)
RDW: 15.7 % — ABNORMAL HIGH (ref 11.5–15.5)
WBC: 7.8 K/uL (ref 4.0–10.5)
nRBC: 0 % (ref 0.0–0.2)

## 2024-08-13 LAB — HEPATIC FUNCTION PANEL
ALT: 28 U/L (ref 0–44)
AST: 21 U/L (ref 15–41)
Albumin: 2.4 g/dL — ABNORMAL LOW (ref 3.5–5.0)
Alkaline Phosphatase: 77 U/L (ref 38–126)
Bilirubin, Direct: <0.1 mg/dL (ref 0.0–0.2)
Total Bilirubin: <0.2 mg/dL (ref 0.0–1.2)
Total Protein: 5.5 g/dL — ABNORMAL LOW (ref 6.5–8.1)

## 2024-08-13 LAB — PHOSPHORUS: Phosphorus: 5 mg/dL — ABNORMAL HIGH (ref 2.5–4.6)

## 2024-08-13 LAB — PREALBUMIN: Prealbumin: 23 mg/dL (ref 18–38)

## 2024-08-13 LAB — C-REACTIVE PROTEIN: CRP: 1.6 mg/dL — ABNORMAL HIGH (ref <1.0)

## 2024-08-13 LAB — MAGNESIUM: Magnesium: 1.6 mg/dL — ABNORMAL LOW (ref 1.7–2.4)

## 2024-08-13 MED ORDER — MAGNESIUM GLUCONATE 500 (27 MG) MG PO TABS
500.0000 mg | ORAL_TABLET | Freq: Two times a day (BID) | ORAL | Status: DC
  Administered 2024-08-14: 500 mg via ORAL
  Filled 2024-08-13: qty 1

## 2024-08-13 MED ORDER — CALCIUM POLYCARBOPHIL 625 MG PO TABS
625.0000 mg | ORAL_TABLET | Freq: Two times a day (BID) | ORAL | 0 refills | Status: DC
  Filled 2024-08-13: qty 60, 30d supply, fill #0

## 2024-08-13 MED ORDER — CHILDRENS CHEW MULTIVITAMIN PO CHEW: 1.0000 | CHEWABLE_TABLET | Freq: Two times a day (BID) | ORAL | Status: DC

## 2024-08-13 MED ORDER — ROSUVASTATIN CALCIUM 10 MG PO TABS
10.0000 mg | ORAL_TABLET | Freq: Every evening | ORAL | 0 refills | Status: DC
  Filled 2024-08-13: qty 30, 30d supply, fill #0

## 2024-08-13 MED ORDER — MAGNESIUM GLUCONATE 500 (27 MG) MG PO TABS
250.0000 mg | ORAL_TABLET | Freq: Once | ORAL | Status: AC
  Administered 2024-08-13: 250 mg via ORAL
  Filled 2024-08-13: qty 1

## 2024-08-13 MED ORDER — MAGNESIUM GLUCONATE 500 (27 MG) MG PO TABS
500.0000 mg | ORAL_TABLET | Freq: Two times a day (BID) | ORAL | 0 refills | Status: DC
  Filled 2024-08-13: qty 60, 30d supply, fill #0

## 2024-08-13 MED ORDER — CYANOCOBALAMIN 1000 MCG PO TABS
500.0000 ug | ORAL_TABLET | ORAL | 0 refills | Status: DC
  Filled 2024-08-13: qty 30, 140d supply, fill #0

## 2024-08-13 MED ORDER — POTASSIUM CHLORIDE CRYS ER 10 MEQ PO TBCR
10.0000 meq | EXTENDED_RELEASE_TABLET | Freq: Every day | ORAL | 0 refills | Status: DC
  Filled 2024-08-13: qty 30, 30d supply, fill #0

## 2024-08-13 MED ORDER — MELATONIN 3 MG PO TABS
3.0000 mg | ORAL_TABLET | Freq: Every day | ORAL | 0 refills | Status: DC
  Filled 2024-08-13: qty 30, 30d supply, fill #0

## 2024-08-13 MED ORDER — HYDROXYZINE HCL 25 MG PO TABS
25.0000 mg | ORAL_TABLET | Freq: Three times a day (TID) | ORAL | 0 refills | Status: DC | PRN
  Filled 2024-08-13: qty 30, 10d supply, fill #0

## 2024-08-13 MED ORDER — MAGNESIUM GLUCONATE 500 (27 MG) MG PO TABS: 750.0000 mg | ORAL_TABLET | Freq: Every day | ORAL | Status: DC

## 2024-08-13 MED ORDER — LOPERAMIDE HCL 2 MG PO CAPS
4.0000 mg | ORAL_CAPSULE | Freq: Three times a day (TID) | ORAL | 0 refills | Status: DC
  Filled 2024-08-13: qty 108, 18d supply, fill #0

## 2024-08-13 MED ORDER — ELIQUIS 5 MG PO TABS
5.0000 mg | ORAL_TABLET | Freq: Two times a day (BID) | ORAL | 0 refills | Status: DC
  Filled 2024-08-13: qty 60, 30d supply, fill #0

## 2024-08-13 MED ORDER — MIDODRINE HCL 5 MG PO TABS
10.0000 mg | ORAL_TABLET | Freq: Three times a day (TID) | ORAL | 0 refills | Status: DC
  Filled 2024-08-13: qty 180, 30d supply, fill #0

## 2024-08-13 NOTE — Progress Notes (Signed)
 PROGRESS NOTE   Subjective/Complaints: No events overnight.  No acute complaints.  Patient feeling very well today, excited regarding discharge. Labs with high phos, low Mag, Cr 1.35 approx stable CRP unchanged 1.6; WBC, HGB, plt stable BP holding up without florinef   CT AP pending  ROS: Patient denies fever, rash, sore throat, blurred vision, dizziness,  , vomiting, diarrhea, cough, shortness of breath or chest pain, joint or back/neck pain, headache, or mood change.     Objective:   No results found.     Recent Labs    08/12/24 1102 08/13/24 0411  WBC 9.6 7.8  HGB 9.9* 8.7*  HCT 31.5* 27.4*  PLT 471* 364     Recent Labs    08/12/24 1102 08/13/24 0411  NA 137 138  K 4.5 4.1  CL 110 112*  CO2 18* 17*  GLUCOSE 137* 86  BUN 21 23  CREATININE 1.30* 1.35*  CALCIUM  9.5 9.1    Intake/Output Summary (Last 24 hours) at 08/13/2024 1032 Last data filed at 08/13/2024 0901 Gross per 24 hour  Intake 360 ml  Output 400 ml  Net -40 ml        Physical Exam: Vital Signs Blood pressure 106/66, pulse 80, temperature 98.2 F (36.8 C), resp. rate 17, height 5' 11 (1.803 m), weight 105 kg, SpO2 100%.   Constitutional: No acute distress.  Ambulating with PT at min assist level. HEENT: NCAT, EOMI, oral membranes moist.    Neck: supple, no JVD. Cardiovascular: RRR without murmur.  1+ bilateral upper extremity and lower extremity edema.--Improving Respiratory/Chest: CTA Bilaterally without wheezes or rales. Normal efforrt.  GI/Abdomen: soft, non-tender, non-distended.  Positive bowel sounds ostomy with liquid light brown stool- Ext: no clubbing, cyanosis. Psych: Appropriate mood and affect.  Increasingly dynamic.  Appropriate  Skin:  abdominal wound  with pink with granulation tissue, moist, covered in clean dressing. --Improving, stable appearance Left nephrostomy tube with bag seal in place.,  Draining clear  urine--Unchanged R flank draining serosanguineous fluid--continues serous drainage, nontender or indurated  Urostomy with  mostly clear output, some white sediment. --Unchanged Ileostomy well-circumscribed, underlying pink healthy tissue--unchanged, with dark brown output--unchanged  Neuro:   AAO.  Oriented to person, place, time, and event.  No apparent cognitive deficits.    +HOH Normal language and speech.  Cranial nerve exam unremarkable.  MMT: 5- out of 5 bilateral upper and lower extremities. No limb ataxia or cerebellar signs.  Bilateral upper and lower extremity tremors with intention, very slight.  Musculoskeletal: No apparent deformities.  Physical exam unchanged from the above on reexamination 08/13/24    Assessment/Plan: 1. Functional deficits which require 3+ hours per day of interdisciplinary therapy in a comprehensive inpatient rehab setting. Physiatrist is providing close team supervision and 24 hour management of active medical problems listed below. Physiatrist and rehab team continue to assess barriers to discharge/monitor patient progress toward functional and medical goals  Care Tool:  Bathing    Body parts bathed by patient: Chest, Face, Abdomen, Right arm, Left arm, Front perineal area, Right upper leg, Left upper leg, Buttocks, Right lower leg, Left lower leg   Body parts bathed by helper: Buttocks, Right lower leg,  Left lower leg     Bathing assist Assist Level: Contact Guard/Touching assist     Upper Body Dressing/Undressing Upper body dressing   What is the patient wearing?: Pull over shirt    Upper body assist Assist Level: Supervision/Verbal cueing    Lower Body Dressing/Undressing Lower body dressing      What is the patient wearing?: Underwear/pull up, Pants     Lower body assist Assist for lower body dressing: Contact Guard/Touching assist     Toileting Toileting Toileting Activity did not occur (Clothing management and hygiene  only): N/A (no void or bm) (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)  Estate agent for toileting: Dependent - Patient 0%     Transfers Chair/bed transfer  Transfers assist  Chair/bed transfer activity did not occur: Safety/medical concerns  Chair/bed transfer assist level: Supervision/Verbal cueing     Locomotion Ambulation   Ambulation assist   Ambulation activity did not occur: Safety/medical concerns (fatigue/dizziness/orthostatics)          Walk 10 feet activity   Assist  Walk 10 feet activity did not occur: Safety/medical concerns (fatigue/dizziness/orthostatics)        Walk 50 feet activity   Assist Walk 50 feet with 2 turns activity did not occur: Safety/medical concerns (fatigue/dizziness/orthostatics)         Walk 150 feet activity   Assist Walk 150 feet activity did not occur: Safety/medical concerns (fatigue/dizziness/orthostatics)         Walk 10 feet on uneven surface  activity   Assist Walk 10 feet on uneven surfaces activity did not occur: Safety/medical concerns (fatigue/dizziness/orthostatics)         Wheelchair     Assist Is the patient using a wheelchair?: Yes Type of Wheelchair: Manual           Wheelchair 50 feet with 2 turns activity    Assist    Wheelchair 50 feet with 2 turns activity did not occur: Safety/medical concerns       Wheelchair 150 feet activity     Assist  Wheelchair 150 feet activity did not occur: Safety/medical concerns       Blood pressure 106/66, pulse 80, temperature 98.2 F (36.8 C), resp. rate 17, height 5' 11 (1.803 m), weight 105 kg, SpO2 100%. Medical Problem List and Plan: 1. 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  Undergoing medical workups as below, will monitor improvement for the next couple of days --address at teams if current level of care exceeds ability of home caregivers.  - 9-13: Bilateral lower extremity PRAFO's in bed for plantarflexion/tight heel cords  --continue CIR therapies including PT, OT, and SLP. Progress has been very slow. May need to consider other discharge venue   - 9/16: Declining with therapies; pleasantly confused today, interactions much less withdrawn but nonsensical. Will need Mod-Max A at home. Concern for physical decline exceeding family ability for caregiving if no improvements moving forward.   - 9-17 waiting on calorie counts today to determine if feeding tube needed; if patient/family continue to refuse, would recommend goals of care discussion and possible palliative care consult--patient opted for placement of  tube feed  - 9-18: Due to ongoing significant support needed to maintain blood pressure, as well as chills today, discussed additional workup/treatment with infectious disease; recommend against treatment of yeast at this time given likely contamination, given ongoing decline will get lactic acid and repeat CT abdomen and pelvis without contrast in a.m--lactic acid upper limit of normal 1.9, CT abdomen and pelvis results -right ventral pelvic wall fluid collection not changed in size from prior study, distal colonic diverticulosis, small bilateral pleural effusions and dependent lower lobe atelectasis, IR consulted yesterday but holding off on drainage of fluid collection  - . Repeat imaging performed yesterday and shows stable fluid collection in the abdominal wall. Patient without elevated WBC, remains afebrile, collection appears similar in size and nature. Per Dr. Jenna, no aspiration or drainage planned for now.    -  9/23: Wife continues to do all cares - doing walking trials with OT to work on endurance and transfers,  remains Mod-Max A to stand but once on his feet can be Min A 46 feet. PT Mod-Max A sit to stand. PO intakes much improved last 2 days.  Likely stedy; has home hospital bed without power lift so will check height for transfers.   - Family meeting today: Est Min-Mod A at home, Wife to work with patient closely over the next week to determine if able to handle needs at home.  - 9/30: Emerging Mod I cog, D3 with thins for dysphagia.  Anticipate supervision level at discharge.  Doing very well    2.  H/o PE/Antithrombotics: -DVT/anticoagulation:  Pharmaceutical: continue Eliquis              -antiplatelet therapy: N/A  - No further blood appreciated in nephrostomy tubes  3. Pain Management: Oxycodone  prn.    - 9/16: Oxycodone  was removed yesterday due to concerns of this contributing to PM confusion and hypotension; nursing reports family in disagreement with this and patient pain increased. Adjust to Tylenol  PRN for mild to moderate, Percocet 5 mg for severe pain to avoid undertreatment. Monitor BP VERY closely  9-18: Has not required Percocet again since 9-15  9/21 patient reports pain is overall under control  4. Mood/Behavior/Sleep: LCSW to follow for evaluation and support.              --melatonin for insomnia.              -antipsychotic agents: N/A  9-14: Per patient, sleeping well.  Sleep log.  Discussed delirium management with patient's wife  - 9-18 sleep log remains appropriate, no apparent hallucinations or delusions.  Continue current management  5. Neuropsych/cognition/encephalopathy likely secondary to metabolic acidosis vs delirium from prolonged illness/hospitalization: This patient is not capable of making decisions on his own behalf.  - 9-11: Increasingly withdrawn, behavioral changes may be secondary to poor nutritional intake and hyponatremia.  Treating as below, on sertraline  100 mg daily, may need to add additional agent such as mirtazapine but hesitant given ongoing  cognitive issues.  9-12: Slightly better today, but consistent with hypoactive delirium with primary difficulty with concentration, no focal neurologic deficits.  Start overnight sleep log, delirium precautions.  Suspect hyponatremia as primary contributor, management as below.  9/13: D3C Flexeril , Megace , Zoloft  due to ongoing cognitive changes.  Sodium is improving.  Get LFTs, ammonia today to evaluate ongoing cognitive deficits--within normal limits.  -14: Cognition seems to be clearing a bit, continue current regimen.   9/15 alert but delayed. Continue to maximize renal/volume/nutritional status 9/16:  Much more alert, but delirious; sleep log appears appropriate. Will add Zyprexa  5 mg PRN for aggitation d/t increased aggitation yesterday afternoon. Keep low dose / low likelihood of Qtc prolongation medications only.   9-18: Cognition improving very gradually, QTc remains prolonged.  DC zyprexa  due to QTC   9-22: Doing much better this week.  Continue with adequate nutrition, hydration    6. Skin/Wound Care: Routine pressure relief measures. Encourage adequate diet             --dressing changes daily. Continue Gerhardt's cream to buttock wound.   -abdominal wound continues to close despite tenous nutritional status   - 9-30: Discussed with patient, will continue PICC until discharge due to difficult sticks and possibility of resuming antibiotics  7. Fluids/Electrolytes/Nutrition/oropharyngeal dysphagia/moderate to severe protein calorie malnutrition:                --9/10 periactin  yesterday stopped d/t lethargy   -will try megace  beginning today   -RD f/u   -push fluids   -recheck bmet in am   -continue juven bid. Has been refusing Ensure supplement.   -kdur 20meq daily   -can't use zofran  d/t QTc prolongation (490)  - MBS   9/3--D3/thins, prealbumin borderline normal at 20   - 9-11: Nausea remains a primary barrier to adequate p.o. intakes.  Recheck EKG today, QTc was 460, scheduled  Zofran  4 mg IV 3 times daily AC.  Will repeat EKG in next 1 to 2 days.  - 9-12: Albumin  infusion per nephrology.  P.o. intakes improving with Zofran .  per IV team midline contraindicated due to bicarb.  May get PICC line once dialysis no longer considered.  9-14: P.o. intakes okay, Zofran  continues to help with nausea.  Per nephrology, okay to reduce fluids, will keep on 70 cc/h normal saline for maintenance for now.  PICC line order placed.  9/15 intake 20-30%. Receiving mtc IVF.    -k+ 3.3, mg++ 1.3   -continue kdur 20meq daily and add 40meq to IVF   -IV mag sulfae 2g x1, add mag gluconate oral daily    9/16: Mag, K repleted. PO intakes remain very poor. Albumin  2.0. Continue maintenance fluids--will likely need TF.   9/17: Ad phosnak 1 packet x3 doses for repletion of K and Phos; repeat in AM--nephrology changed to IV--repleted 9-18  9-22: Nutrition doing much better, eating 50 to 100% of meals.  Replete electrolytes as below. 9/23: Finishing calorie count per SLP today - PO intakes MUCH better - hopeful to remove cortrak tube otherwise will get IR consult to evaluate if PEG possible 9-24: Dietary stating patient is to keep up with 50% of his energy needs, can consider removing core track tube.  Will wait for labs in a.m. to ensure nutrition and hydration continue to be adequate before removing; did just DC free water  flushes and IV fluids 9-25: Core track DC'd, patient labs look okay.  Really encourage p.o. fluids, resuming IV maintenance as below due to tachycardia but should really push p.o. intakes this week. 9-26: P.o. intakes really good, bring maintenance fluids down to 40 cc/h, BMP and Mag in AM 9-29: Creatinine slightly up, encourage p.o. fluids, but not quite at AKI level yet.  Other labs are stable.  Magnesium  holding up. 10-2: Creatinine remains 1.3, stable but overall elevated.  Discussed continuing to push fluids once return home, recommend getting a large water  bottle and drinking 1  for the morning and 1 through the afternoon.  Increase magnesium  supplements  to 500 mg twice daily.  8. Sepsis/Abdominal wall/mesh infection: IV Vanc/Ceftriaxone  for 6 weeks from 08/21 (10/2)             --Probable 6 months total of antibiotics  --Corynebacterium species-->6 more weeks ?Dalbavancin for OP use, cont ceftriazone and linezolid  for now  per ID -appreciate recent ID f/u CRP nl 0.8 ESR 44 WBC 8.2 9-11: WBC labs remained stable. 9/15 wbc 7.1 9/16: Discussed with Dr. Dennise ID possibility of encephalopathy with current antibiotics, as well as +UA yesterday. Recommendation to continue current broad spectrum antibiotics as they are unlikely contributors and regimen has already been altered from vancomycin . Given some yeast in UA will await culture to see if secondary fungal infection present vs contaminant. -pending 9/17 9/18; -LA 1.9, CT abdomen and pelvis results  --see above 9-22: Per Dr Dennise,  then continue oritavancin  and ceftriaxone  till 10/2.  After which can do 6 more weeks on omdada+dalvance/tedezolid.  Continue Rocephin .-- will double check with ID on this timeline 9/23 9-24: Infectious disease, pharmacy working with family today on discharge antibiotic regimen from a cost intolerance perspective; I agree with trialing his home antibiotic regimen in the hospital to ensure that nausea does not limit him again 9-25: Extensive discussions with infectious disease Dr. Overton and Dr. Sheldon today; feel mesh infection is less likely, decision to remove antibiotics and monitor closely over the next week.  Patient will get CT abdomen close to discharge to reevaluate size of abscess (would order 10/2).  CRPs every 72 hours and other labs for monitoring per ID.  Patient and family understanding and agreeable with this. 10-1: CRP is up to 1.5, WBC up from 6-9, discussed with infectious disease.  Will follow-up on CT abdomen and pelvis in a.m.  No new recommendations. 10-2: CRP 1.6, WBC stable.  CT  abdomen and pelvis largely unchanged.  Per ID, continue off of antibiotics, will follow-up closely as outpatient  9. Left> right hydronephrosis: Nephrostomy tubes with stent exchanged on 08/27 --continue clamping right tube and left to drain per urology.  Monitor for blood recheck CBC in am --hemoglobin stable, no further bloody output appreciated -9/15 flush left nephrostomy tube 9-18: Prior right tube site continues to drain, CT abdomen and pelvis as above - 9-27: Discussed with urology exchange of nephrostomy tubes; will address as outpatient per their service  10. Acute on chronic renal failure/hyponatremia:  elevated CR- baseline creat probably ~1.4  -9/10 will give addnl IVF today d/t poor po intake   -recheck bmet in am  - 9-11: AKI significantly worse, now with hyponatremia 131.  Will give 1 L fluid bolus normal saline today, recheck BMP; given brisk uptrend despite increase in IV fluids will consult nephrology for further recommendations.  - 9-12: Appreciate nephrology input and recommendations.  Responding slightly to increased IV fluids overnight, normal saline 125/h.  Hyponatremia remains 131. On multiple medications that could be contributing including Florinef , sertraline , and linezolid .  See their note for specific recommendations; advised to remain on fluids for primary treatment of hyponatremia, do not recommend salt tabs at this time.  - If renal function does not improve with fluids may need CT scan of the abdomen and pelvis to verify patency of nephrostomy/ileal conduit drainage   9-13: Potassium chloride  packet 40 mill equivalents ordered this a.m. for repletion.  Sodium improving, bicarb improving, but renal function remains unchanged on bicarb drip.  9-14: Renal function improved, acidosis resolved, sodium 134.  Per nephrology, can DC fluids, but agreeable  to maintenance fluids given ongoing poor p.o. intakes.  Continue to monitor closely.  9/15-17 Cr continues to improve  gradually, continue IVF, push po  9/18: On TF, DC IVF, move to FWF 200 ccs QID--monitor closely  9-19: Seems to be tolerating free water  flushes well, continuing to improve.  Continue through this weekend.     9/21 creatinine down to 1.29/BUN 40 continue current regimen and monitor  9-22: BUN 35, creatinine 1.19.  9/23: ok to check renal function 2x weekly, now GFR >60. DC FWF, encourage PO. --Labs in a.m.  9-25: Renal function stable, however resuming some IV fluids as below.  9/26: Encourage p.o., reducing IV fluids to 40 cc/h.  BMP tomorrow--can come off of IV fluids over this weekend if p.o. fluid intakes are adequate  9-29: Creatinine continues to uptrend, not quite AKI.  Encourage p.o. fluids.  Overall, doing much better.  10-1: Repeat labs today, as fluid intakes have been minimal.  Blood pressure downtrending--slight uptrend in creatinine, really encouraging patient to drink fluid!  Goal 6 to 8  250 cc cups per day.--See above, creatinine slightly up to stable, encourage p.o. fluids but will not need IV   11. OSA: Encourage CPAP use 12. Hypotension/tachycardia/?adrenal insufficiency: Continue Midodrine  15 mg TID and Florinef  2 mg daily. Plasma cortisol 13.6 6/25/             --continues to have significant tachycardia with activity likely due to orthostasis and deconditioning.  - -hgb 11.5   -continue metoprolol  low dose 12.5mg  bid, bp remains soft but stable at present   -  IVF as above Per PT- has been not dropping in therapy   9-13: Orthostatic today, DC Lopressor  and increase midodrine  to 20 mg 3 times daily.  Has been getting a significant amount of IV fluid last few days, still likely volume depleted per nephrology.  9-14: Blood pressure remains stable, but soft. 9/15 hypotensive this morning.  Continue IVF and plan as above 9/16: A bit improved today; continue IVF, no signs of volume overload. If profoundly hypotensive again may need to transfer to acute given maximizing  fluids/BP support without pressors.  9-18: DC IV fluid as above today; monitor blood pressures very closely, seem more stable than they were last week.  On chart review, was minimally responsive to trial of high-dose steroids in August; does not look like cosyntropin stim test was ever performed; Continue Florinef  for now, may need to consider if blood pressures do not improve 9-19: Blood pressure holding up in the low 100s, continue current regimen. 9/20-21 BP stable, continue to monitor  9/23: Stable -reduce midodrine  back to 15 mg 3 times daily--> 10 mg 3 times daily 9-24.  If tolerates well, will start reducing Florinef  next.  May need rate control agent although current tachycardia seems to be compensatory sinus tach 9-24: Continues to get tachycardic into the 150s with exertion, comes down to 90s to 110s with rest.  Discussed with cardiology, appreciate recommendations, clearly compensatory and should not treat.  Given poor p.o. fluid intakes, will resume IV normal saline at 70 cc/h for maintenance and really encourage p.o. fluids 9-26: Looking much better today.  Reduce maintenance fluids as above.  Monitor on reduced dose of Florinef  over the weekend, and if he does well would further reduce to 0.05 mg 9-29: Reduce dose of Florinef  to 0.05 mg; monitor therapy tolerance, goal to wean off before discharge--monitor for 1 more day, then if continues to stay up will DC Florinef   10-1: DC Florinef --monitor closely     08/13/2024    5:58 AM 08/13/2024    5:00 AM 08/12/2024    7:50 PM  Vitals with BMI  Weight  231 lbs 8 oz   Systolic 106  121  Diastolic 66  68  Pulse 80  86     13. 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  -- resolved  9/1 held reglan   -9/16: resume reglan  given low OP, nausea--with benefit, output remains low   - 9/18: Ostomy output picking up considerably with tube feeds; on fibercon BID; appreciate nutrition input--staying appropriate   - 9/23-10/1: Ostomy OP  appropriate; remains quite liquid but not large-volume.  Continue current regimen.   14. Depression: On Zoloft -- Dced 9/13 due to hyponatremia and confusion   - 9/16: Family concerned for serotonin syndrome with zoloft +linezolid - no tachycardia, fevers, seizures, or severe agitation reported - encephalopathy has not improved with removal of zoloft . Low suspicion at this time.   15. Malnutrition/: Albumin  @1 .9/TP-5.1.  --> 2.2 9/11;  - 9-11: Adding Premeal Zofran  as above.  Will get nutrition to start calorie count; may need additional supplementation or discussion of feeding tube if does not improve.  - 9-12: Slightly improved, continue this regimen.  9-13: Very poor p.o. intakes yesterday.  DC Megace  due to possible cognitive effects, no perceived benefit.  Will discuss possible feeding tube with family--patient rejects, will get calorie count for 48 hours and if no improvement may readdress tube feed versus palliative consult  9-14: Wife encouraging p.o.'s, patient motivated to avoid feeding tube.  Calorie count started.  Maintenance fluids as above.  9/16: Qtc 519; DC zofran , resume relgan 5 mg TID AC as he did well with this in the past and ostomy output has slowed considerably  9-17: QTc remains prolonged; will not resume Zofran  at this time.  Patient agreeable to placement of core track and discussed with nutrition starting with nocturnal tube feeds for supplementation, to allow appetite during the day to continue to work on increasing p.o.'s.  Will give one-time Xanax  to assist in anxiety with core track placement.  9-18: Tolerating tube feeds well, labs looking stable.  P.o. intakes poor this a.m. due to timing of therapy/medication/breakfast, continue to encourage as primary form of nutrition.  9-19: Tolerating overnight tube feeds well, p.o. intakes during the day complicated by gagging/coughing due to tube feed placement.  Chest x-ray yesterday was negative.  Orders to maintain head of bed 30  degrees whether or not tube feeds are running, and viscous lidocaine  4 times daily as needed for sensation of gagging.  9/20 nausea noted today, patient did get  dose of Compazine  and is continued on Reglan .  Will check with pharmacy to see if any new medications could be contributing.Appears he was transition to nocturnal tube feeds may be rate a little high for him will decrease to 60 mL/h overnight.  Discussed with pharmacy he got a one-time dose of Oritivancin yesterday, he should not receive any more of this as this was a one-time order  -9-22- nausea improved with current regimen--helping considerably with p.o. intakes, may be able to DC tube feeds this week.  Dietary resumed calorie count today.  9/23: Nausea resolved; pending stable PO intakes may DC reglan  this week--DC 9-26   16. Anemia of chronic illness: Stable overall 9/8-11  - Hemoglobin stable 9-10  9-22:Hemoglobin from 9.7->8.6   No obvious sources of bleeding, repeat in AM.  9/23: HgB stable 8.3--> 8.0--> 8.6-->9.9  Latest Ref Rng & Units 08/13/2024    4:11 AM 08/12/2024   11:02 AM 08/10/2024    4:29 AM  CBC  WBC 4.0 - 10.5 K/uL 7.8  9.6  6.6   Hemoglobin 13.0 - 17.0 g/dL 8.7  9.9  8.6   Hematocrit 39.0 - 52.0 % 27.4  31.5  27.1   Platelets 150 - 400 K/uL 364  471  321     17.  Thrombocytopenia.  Mild, 136 today.  Monitor very closely in setting of linezolid .   - 9/19: 104; per Dr. Dennise likely adjusting linezolid  to orvitatancin starting Monday.  Continue to trend  9-22: Remains downtrending, trend.  71 today.  May take a few days to bounce back from linezolid   9/23: PLT stabilized 72; monitor  9-25: Platelets starting to come back, 130s today.  Would be cautious to resume linezolid    9-29: Platelets normalized.  10-1: Platelets no uptrending, repeat labs in AM.--Back within normal range 10-2  18.  Hypophosphatemia.  1.3 to 3.1 with IV repletion, next day 1.6.  - 9-19: Per electrolyte protocol, start Phos-Nak Powder  via tube feed 2 packets 4 times daily for 5 doses  - Repeat daily.  -9/20 replete today, 15mmol IV,  discussed dose pharmacy  -9/21 improved to 1.9, continue replete per pharm rec   -22: 2.3, replete today, check in a.m.--repleted  19. Low MG  -9/20 replete today 2g IV,  discussed dose pharmacy  -9/21 improved today continue to monitor   -Magnesium  stable on recent labs   LOS: 33 days A FACE TO FACE EVALUATION WAS PERFORMED  Kristopher Thompson 08/13/2024, 10:32 AM

## 2024-08-13 NOTE — Progress Notes (Signed)
 Provided patient & wife discharge supplies and education.   Kristopher Thompson

## 2024-08-13 NOTE — Progress Notes (Signed)
 Patient ID: Kristopher Thompson, male   DOB: 02/22/1951, 73 y.o.   MRN: [id number]  SW received updates from Kristopher Thompson/[facility] they are able to accept referral.   *SSW spoke with Kristopher Thompson to discuss referral, and will follow-up if there are any issues. She later called to inform that speech therapy is delayed likely at end of month due to the therapist being on maternity leave. She also states that OT will assist with aide care needs. SOC of care will be tomorrow.   1012- SW spoke with pt wife to inform on above. She inquires about ostomy supplies since the previous agency helped assist. She states they have enough supply now but wanted to be sure there was assistance with ordering/providing if it is needed. SW informed this will be relayed to [facility].   *SW spoke with Kristopher Thompson/[facility] about ostomy supplies and reports the [facility] typically assist. She will call if there are any issues.   Kristopher Thompson, MSW, LCSW Office: [phone] Cell: [phone] Fax: [phone]

## 2024-08-13 NOTE — Progress Notes (Signed)
 Speech Language Pathology Daily Session Note  Patient Details  Name: Kristopher Thompson MRN: [id number] Date of Birth: February 09, 1951  Today's Date: 08/13/2024 SLP Individual Time: 0921-1000 SLP Individual Time Calculation (min): 39 min and Today's Date: 08/13/2024 SLP Missed Time: 6 Minutes Missed Time Reason: Nursing care  Short Term Goals: Week 5: SLP Short Term Goal 1 (Week 5): STGs - LTGs d/t ELOS  Skilled Therapeutic Interventions:   Pt and his wife greeted at bedside for tx targeting cognition after ostomy bag change. He was then assisted w/ lower body dressing given leak from previous ostomy bag. He benefited from [facility] w/ use of RW to assist w/ balance. He demonstrated adequate safety awareness and sequencing throughout. He then completed a trail making task targeting alternating attention. He completed this @ modI, demonstrating adequate processing time. He also completed final complex organization task independently. SLP provided final education re progress thus far and pt report of very mild cognitive deficits remaining during complex tasks only. Additional questions were answered. At the end of tx tasks, he was left up in his WC w/ the call light within reach. See d/c summary for more info.   Pain  None reported  Therapy/Group: Individual Therapy  Kristopher Thompson 08/13/2024, 7:52 AM

## 2024-08-13 NOTE — Plan of Care (Signed)
  Problem: Consults Goal: RH GENERAL PATIENT EDUCATION Description: See Patient Education module for education specifics. Outcome: Progressing   Problem: RH BOWEL ELIMINATION Goal: RH STG MANAGE BOWEL WITH ASSISTANCE Description: STG Manage Bowel with supervision Assistance. Outcome: Progressing   Problem: RH BLADDER ELIMINATION Goal: RH STG MANAGE BLADDER WITH ASSISTANCE Description: STG Manage Bladder With supervision Assistance Outcome: Progressing   Problem: RH SKIN INTEGRITY Goal: RH STG SKIN FREE OF INFECTION/BREAKDOWN Description: Manage skin free of infection with supervision assistance Outcome: Progressing   Problem: RH SAFETY Goal: RH STG ADHERE TO SAFETY PRECAUTIONS W/ASSISTANCE/DEVICE Description: STG Adhere to Safety Precautions With Assistance/Device. Outcome: Progressing   Problem: RH PAIN MANAGEMENT Goal: RH STG PAIN MANAGED AT OR BELOW PT'S PAIN GOAL Description: <4 w/ prns Outcome: Progressing   Problem: RH KNOWLEDGE DEFICIT GENERAL Goal: RH STG INCREASE KNOWLEDGE OF SELF CARE AFTER HOSPITALIZATION Description: Manage increase knowledge of self care care after hospitalization with supervision assistance from wife using educational materials provided Outcome: Progressing

## 2024-08-13 NOTE — Progress Notes (Signed)
 Patient ID: PRESTIN MUNCH, male   DOB: 1951-04-08, 73 y.o.   MRN: [id number]  1341- SW received return phone call from Angie/[facility] reporting that the [facility] in [place] no longer has an aide, SLP or OT. SW will work on finding another HHA.   1356- SW called pt wife to inform on above. She is aware this may not be in place at time of d/c but SW will continue to work on securing a HHA.   SW sent HHPT/OT/SLP/SN/aide referral to Cheryl/[facility] and waiting on follow-up.   Graeme Jude, MSW, LCSW Office: [phone] Cell: [phone] Fax: [phone]

## 2024-08-13 NOTE — Consult Note (Signed)
 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 Familiar to [facility] team from previous admission (last seen 07/02/2024) Per bedside staff having issues with leaking.  Will plan to see patient Friday 08/14/2024 when [facility] nurse back on campus.   Thank you,    Powell Bar MSN, RN-BC, [employer]

## 2024-08-13 NOTE — Progress Notes (Signed)
 Occupational Therapy Session Note  Patient Details  Name: Kristopher Thompson MRN: [id number] Date of Birth: Mar 23, 1951  Today's Date: 08/13/2024 OT Individual Time: 1100-1200 OT Individual Time Calculation (min): 60 min    Short Term Goals: Week 4:  OT Short Term Goal 1 (Week 4): Pt will complete sit > stand with min A using LRAD OT Short Term Goal 1 - Progress (Week 4): Met OT Short Term Goal 2 (Week 4): Pt will complete stand pivots with min A OT Short Term Goal 2 - Progress (Week 4): Met OT Short Term Goal 3 (Week 4): Pt will tolerate standing for 1 min to increase activity tolerance OT Short Term Goal 3 - Progress (Week 4): Met Week 5:  OT Short Term Goal 1 (Week 5): STG= LTG d/t ELOS Week 6:     Skilled Therapeutic Interventions/Progress Updates:    1:1 Pt received in the bed - juts returned from CT- missed 15 min - Pt performed bed mobility from flat bed to EOB with supervision. When sat up noted his pants and shirt were wet from urostomy bad- returned to supine and nursing came to change. Returned to sitting EOb and changed clothing with setup assistance. Pt ambulated to sink with RW with supervision and stood to setup his toothbrush and started to brushed his teeth before wanting to sit down. Finished in sitting. Pt then ambulated with RW from his room to the dayroom with supervision. Pt performed 10 sit to stands in a row then in standing toe steps bilaterally up on 3 inch step (10 times). Pt then ambulated 25 feet with contact guard wITHOUT THE RW!!!!!  Returned to the room and left sitting up for lunch.   Therapy Documentation Precautions:  Precautions Precautions: Fall Recall of Precautions/Restrictions: Impaired Precaution/Restrictions Comments: urostomy, ileostomy, L nephrostomy drain Restrictions Weight Bearing Restrictions Per Provider Order: No  Pain:  No reports ofpain  Therapy/Group: Individual Therapy  Claudene [facility] 08/13/2024, 3:21 PM

## 2024-08-13 NOTE — Progress Notes (Signed)
 Physical Therapy Discharge Summary  Patient Details  Name: Kristopher Thompson MRN: [id number] Date of Birth: 11-30-1950  Date of Discharge from PT service:August 13, 2024  Today's Date: 08/13/2024 PT Individual Time: 8682-8567 PT Individual Time Calculation (min): 75 min    Patient has met 9 of 9 long term goals due to improved activity tolerance, improved balance, improved postural control, increased strength, improved attention, improved awareness, and improved coordination.  Patient to discharge at an ambulatory level Supervision.   Patient's care partner is independent to provide the necessary physical and cognitive assistance at discharge.  Reasons goals not met: NA  Recommendation:  Patient will benefit from ongoing skilled PT services in home health setting to continue to advance safe functional mobility, address ongoing impairments in strength, balance, transfers, ambulation, endurance, and minimize fall risk.  Equipment: No equipment provided  Reasons for discharge: treatment goals met and discharge from hospital  Patient/family agrees with progress made and goals achieved: Yes  Skilled Therapeutic Interventions: Pt received seated in [facility] and agrees to therapy. No complaint of pain. WC transport to gym for time management. Pt completes ramp navigation and car transfer with RW and cues for sequencing and safety. Extended seated rest break. Pt completes x4 6 steps with bilateral handrails and cues for step placement and sequencing.   Pt takes extended seated rest break. Pt then ambulates x115' without AD, with PT providing CGA and +2 provided for WC follow to allow pt to complete maximum distance safely.   Following extended seated rest break, pt ambulates additional x80' with close supervision initially, progressing to minA due to slight LOB with 180 degree turn. Pt noted to have leakage from urostomy drain. RN and PA Pam notified. Pt ambulates final bout of x90' with CGA and pt  able to prevent complete LOB with cross over steps and righting reactions. WC transport back to room. Stand step to bed with cues for positioning and no ASD. Pt performs supine to sit with cues for positioning. Left supine with all needs within reach.   PT Discharge Precautions/Restrictions Precautions Precautions: Fall Restrictions Weight Bearing Restrictions Per Provider Order: No Pain Interference Pain Interference Pain Effect on Sleep: 1. Rarely or not at all Pain Interference with Therapy Activities: 1. Rarely or not at all Pain Interference with Day-to-Day Activities: 1. Rarely or not at all Vision/Perception  Vision - History Ability to See in Adequate Light: 0 Adequate Perception Perception: Within Functional Limits Praxis Praxis: WFL  Cognition Overall Cognitive Status: Impaired/Different from baseline Arousal/Alertness: Awake/alert Orientation Level: Oriented X4 Year: 2025 Month: September Day of Week: Correct Memory: Appears intact Memory Impairment: Decreased recall of new information;Retrieval deficit;Storage deficit Awareness: Appears intact Problem Solving: Appears intact Safety/Judgment: Appears intact Comments: Pt with slower processing but overall functional during session Sensation Sensation Light Touch: Appears Intact Peripheral sensation comments: Reports numbness occasionally in tops of feet Light Touch Impaired Details: Impaired LLE;Impaired RLE Coordination Gross Motor Movements are Fluid and Coordinated: No Fine Motor Movements are Fluid and Coordinated: No Coordination and Movement Description: Incredible improvement since eval but still with significant deconditioning that impacts coordination Motor  Motor Motor: Other (comment) Motor - Discharge Observations: Deconditioning  Mobility Bed Mobility Bed Mobility: Supine to Sit;Sit to Supine Supine to Sit: Supervision/Verbal cueing Sit to Supine: Supervision/Verbal  cueing Transfers Transfers: Sit to Stand;Stand to Sit;Stand Pivot Transfers Sit to Stand: Supervision/Verbal cueing Stand to Sit: Supervision/Verbal cueing Stand Pivot Transfers: Supervision/Verbal cueing Stand Pivot Transfer Details: Verbal cues for technique;Verbal cues for  sequencing Transfer (Assistive device): None Locomotion  Gait Ambulation: Yes Gait Assistance: Supervision/Verbal cueing Gait Distance (Feet): 175 Feet Assistive device: Rolling walker Gait Assistance Details: Verbal cues for technique;Verbal cues for gait pattern;Verbal cues for precautions/safety Gait Gait: Yes Gait Pattern: Impaired Gait Pattern: Decreased stride length;Trunk flexed Stairs / Additional Locomotion Stairs: Yes Stairs Assistance: Supervision/Verbal cueing Stair Management Technique: Two rails Number of Stairs: 4 Height of Stairs: 6 Ramp: Supervision/Verbal cueing Curb: Supervision/Verbal cueing Wheelchair Mobility Wheelchair Mobility: No  Trunk/Postural Assessment  Cervical Assessment Cervical Assessment: Exceptions to [facility] (Forward head) Thoracic Assessment Thoracic Assessment: Exceptions to [facility] (rounded shoulders) Lumbar Assessment Lumbar Assessment: Exceptions to [facility] (posterior pelvic tilt) Postural Control Postural Control: Deficits on evaluation Righting Reactions: dealyed, inadequate  Balance Balance Balance Assessed: Yes Static Sitting Balance Static Sitting - Balance Support: Feet supported Static Sitting - Level of Assistance: 6: Modified independent (Device/Increase time) Dynamic Sitting Balance Dynamic Sitting - Balance Support: Feet supported Dynamic Sitting - Level of Assistance: 5: Stand by assistance Static Standing Balance Static Standing - Balance Support: During functional activity;Bilateral upper extremity supported Static Standing - Level of Assistance: 5: Stand by assistance Dynamic Standing Balance Dynamic Standing - Balance Support: During functional  activity;Bilateral upper extremity supported Dynamic Standing - Level of Assistance: 5: Stand by assistance Extremity Assessment  RLE Assessment RLE Assessment: Exceptions to [facility] Passive Range of Motion (PROM) Comments: lacks a few degrees of extension General Strength Comments: Grossly 3+/5 LLE Assessment LLE Assessment: Exceptions to [facility] Passive Range of Motion (PROM) Comments: lacks a few degrees of extension General Strength Comments: grossly 3+/5   Elsie JAYSON Dawn, PT, DPT 08/13/2024, 3:59 PM

## 2024-08-14 ENCOUNTER — Other Ambulatory Visit (HOSPITAL_COMMUNITY): Payer: Self-pay

## 2024-08-14 ENCOUNTER — Telehealth (HOSPITAL_COMMUNITY): Payer: Self-pay | Admitting: Pharmacy Technician

## 2024-08-14 NOTE — Progress Notes (Signed)
 Inpatient Rehabilitation Discharge Medication Review by a Pharmacist  A complete drug regimen review was completed for this patient to identify any potential clinically significant medication issues.  High Risk Drug Classes Is patient taking? Indication by Medication  Antipsychotic No   Anticoagulant Yes Apixaban --PE  Antibiotic No   Opioid No   Antiplatelet No   Hypoglycemics/insulin  No   Vasoactive Medication Yes Midodrine -Hypotension  Chemotherapy No   Other Yes MVI, magnesium  gluconate, KCL, B12, fish oil--supplementation Polycarbophil-constipation Loperamide --diarrhea Hydroxyzine --anxiety Atrovent  NS--allergies Manuka honey--wound care Melatonin-Sleep Crestor -HLD     Type of Medication Issue Identified Description of Issue Recommendation(s)  Drug Interaction(s) (clinically significant)     Duplicate Therapy     Allergy     No Medication Administration End Date     Incorrect Dose     Additional Drug Therapy Needed     Significant med changes from prior encounter (inform family/care partners about these prior to discharge). Periactin , reglan , zoloft , and flexeril  all d/c'd Please educate patient and family on med changes  Other       Clinically significant medication issues were identified that warrant physician communication and completion of prescribed/recommended actions by midnight of the next day:  No  Name of provider notified for urgent issues identified:   Provider Method of Notification:     Pharmacist comments:   Time spent performing this drug regimen review (minutes):  10   Hareem Surowiec A. Lyle, PharmD, BCPS, FNKF Clinical Pharmacist [place] Please utilize Amion for appropriate phone number to reach the unit pharmacist [facility])  08/14/2024 9:31 AM

## 2024-08-14 NOTE — Progress Notes (Signed)
 PROGRESS NOTE   Subjective/Complaints:  No acute complaints.  No events overnight.  Vitals remained stable, afebrile. Wound ostomy nursing came by this morning to assist in options for leaking urostomy; family feels well equipped to deal with all of his wounds and drains at home. Per ID, after review of labs and CT abdomen yesterday, no resumption of antibiotics, will follow-up with Kristopher Thompson as an outpatient to reassess.  Advised patient that if he goes home and has any questions or concerns about medication or his medical status, he is welcome to call up to the unit or to our outpatient office or weekdays and we can get back to him within 24 hours.  ROS: Patient denies fever, rash, sore throat, blurred vision, dizziness,  , vomiting, diarrhea, cough, shortness of breath or chest pain, joint or back/neck pain, headache, or mood change.     Objective:   CT ABDOMEN PELVIS WO CONTRAST Result Date: 08/13/2024 CLINICAL DATA:  Intra-abdominal abscess monitorring for improvment. EXAM: CT ABDOMEN AND PELVIS WITHOUT CONTRAST TECHNIQUE: Multidetector CT imaging of the abdomen and pelvis was performed following the standard protocol without IV contrast. RADIATION DOSE REDUCTION: This exam was performed according to the departmental dose-optimization program which includes automated exposure control, adjustment of the mA and/or kV according to patient size and/or use of iterative reconstruction technique. COMPARISON:  CT scan abdomen and pelvis from 07/31/2024. FINDINGS: Lower chest: The lung bases are clear. There is trace left pleural effusion. No right pleural effusion. The heart is normal in size. No pericardial effusion. Hepatobiliary: The liver is normal in size. Non-cirrhotic configuration. No suspicious mass. No intrahepatic or extrahepatic bile duct dilation. Gallbladder is surgically absent. Pancreas: Redemonstration of mild-to-moderate  diffuse pancreatic atrophy and multiple sub 5 mm dystrophic calcifications throughout the pancreas, compatible with sequela of chronic pancreatitis. There is a well-circumscribed approximately 1.2 x 1.3 cm hypoattenuating area along the anterior aspect of the pancreatic body/tail junction region, incompletely characterized on the current exam but grossly similar to multiple prior studies. No peripancreatic fat stranding. Main pancreatic duct is not dilated. Spleen: Within normal limits. No focal lesion. Adrenals/Urinary Tract: Adrenal glands are unremarkable. Redemonstration of at least moderate diffuse atrophy of left kidney. Bilateral nephrostomy tubes are again seen. There is mild right hydronephrosis, overall similar to the recent prior study. There is also right-sided ureteric stent, inserted through right lower quadrant urostomy approach. No left hydronephrosis. No nephroureterolithiasis on either side. Urinary bladder is surgically absent. There is right lower quadrant urostomy. Stomach/Bowel: There is a small sliding hiatal hernia. No disproportionate dilation of the small or large bowel loops. No evidence of abnormal bowel wall thickening or inflammatory changes. The appendix was not visualized; however there is no acute inflammatory process in the right lower quadrant. There are multiple diverticula mainly in the left hemi colon, without imaging signs of diverticulitis. Vascular/Lymphatic: No ascites or pneumoperitoneum. No abdominal or pelvic lymphadenopathy, by size criteria. No aneurysmal dilation of the major abdominal arteries. There are mild peripheral atherosclerotic vascular calcifications of the aorta and its major branches. Reproductive: Patient is status post surgical removal of prostate and bilateral seminal vesicles. Other: Midline surgical scar noted. Right mid abdominal  and right lower quadrant ostomy noted. Redemonstration of heterogeneous collection along the right lower flank region  adjacent to the right-sided ureteric stent without interval change. The soft tissues and abdominal wall are otherwise unremarkable. Musculoskeletal: No suspicious osseous lesions. There are mild - moderate multilevel degenerative changes in the visualized spine. IMPRESSION: 1. No significant interval change since the recent prior study. 2. Redemonstration of heterogeneous collection along the right lower flank region adjacent to the right-sided ureteric stent without interval change. 3. Multiple other nonacute observations, as described above. Aortic Atherosclerosis (ICD10-I70.0). Electronically Signed   By: Ree Molt M.D.   On: 08/13/2024 15:05       Recent Labs    08/12/24 1102 08/13/24 0411  WBC 9.6 7.8  HGB 9.9* 8.7*  HCT 31.5* 27.4*  PLT 471* 364     Recent Labs    08/12/24 1102 08/13/24 0411  NA 137 138  K 4.5 4.1  CL 110 112*  CO2 18* 17*  GLUCOSE 137* 86  BUN 21 23  CREATININE 1.30* 1.35*  CALCIUM  9.5 9.1    Intake/Output Summary (Last 24 hours) at 08/14/2024 0933 Last data filed at 08/13/2024 1832 Gross per 24 hour  Intake 360 ml  Output --  Net 360 ml        Physical Exam: Vital Signs Blood pressure 114/65, pulse 83, temperature 97.9 F (36.6 C), temperature source Oral, resp. rate 19, height 5' 11 (1.803 m), weight 105 kg, SpO2 100%.   Constitutional: No acute distress.  Laying in bed  HEENT: NCAT, EOMI, oral membranes moist.    Neck: supple, no JVD. Cardiovascular: RRR without murmur.  1+ bilateral upper extremity and lower extremity edema.--Improving Respiratory/Chest: CTA Bilaterally without wheezes or rales. Normal efforrt.  GI/Abdomen: soft, non-tender, non-distended.  Positive bowel sounds ostomy with liquid light brown stool- Ext: no clubbing, cyanosis. Psych: Appropriate mood and affect.  Increasingly dynamic.  Appropriate  Skin: PICC line status post removal, dry dressing overlying. abdominal wound  with pink with granulation  tissue,dry, covered in clean dressing. -- much improved Left nephrostomy tube with bag seal in place.,  Draining clear urine--Unchanged R flank draining serous fluid, moderate amount, nontender or indurated --unchanged Urostomy with  mostly clear output  --Unchanged Ileostomy well-circumscribed, underlying pink healthy tissue--unchanged, with dark brown type 6-7 output  Neuro:   AAO.  Oriented to person, place, time, and event.  No apparent cognitive deficits.    +HOH Normal language and speech.  Cranial nerve exam unremarkable.  MMT: 5- out of 5 bilateral upper and lower extremities. No limb ataxia or cerebellar signs.  Bilateral upper and lower extremity tremors with intention, very slight.  Musculoskeletal: No apparent deformities.  Physical exam unchanged from the above on reexamination 08/14/24    Assessment/Plan: 1. Functional deficits which require 3+ hours per day of interdisciplinary therapy in a comprehensive inpatient rehab setting. Physiatrist is providing close team supervision and 24 hour management of active medical problems listed below. Physiatrist and rehab team continue to assess barriers to discharge/monitor patient progress toward functional and medical goals  Care Tool:  Bathing    Body parts bathed by patient: Chest, Face, Abdomen, Right arm, Left arm, Front perineal area, Right upper leg, Left upper leg, Buttocks, Right lower leg, Left lower leg   Body parts bathed by helper: Buttocks, Right lower leg, Left lower leg     Bathing assist Assist Level: Contact Guard/Touching assist     Upper Body Dressing/Undressing Upper body dressing   What  is the patient wearing?: Pull over shirt    Upper body assist Assist Level: Supervision/Verbal cueing    Lower Body Dressing/Undressing Lower body dressing      What is the patient wearing?: Underwear/pull up, Pants     Lower body assist Assist for lower body dressing: Contact Guard/Touching assist      Toileting Toileting Toileting Activity did not occur (Clothing management and hygiene only): N/A (no void or bm) (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)  Estate agent for toileting: Dependent - Patient 0%     Transfers Chair/bed transfer  Transfers assist  Chair/bed transfer activity did not occur: Safety/medical concerns  Chair/bed transfer assist level: Supervision/Verbal cueing     Locomotion Ambulation   Ambulation assist   Ambulation activity did not occur: Safety/medical concerns (fatigue/dizziness/orthostatics)  Assist level: Supervision/Verbal cueing Assistive device: Walker-rolling Max distance: 300'   Walk 10 feet activity   Assist  Walk 10 feet activity did not occur: Safety/medical concerns (fatigue/dizziness/orthostatics)  Assist level: Supervision/Verbal cueing Assistive device: No Device   Walk 50 feet activity   Assist Walk 50 feet with 2 turns activity did not occur: Safety/medical concerns (fatigue/dizziness/orthostatics)  Assist level: Supervision/Verbal cueing Assistive device: No Device    Walk 150 feet activity   Assist Walk 150 feet activity did not occur: Safety/medical concerns (fatigue/dizziness/orthostatics)  Assist level: Supervision/Verbal cueing Assistive device: No Device    Walk 10 feet on uneven surface  activity   Assist Walk 10 feet on uneven surfaces activity did not occur: Safety/medical concerns (fatigue/dizziness/orthostatics)   Assist level: Supervision/Verbal cueing     Wheelchair     Assist Is the patient using a wheelchair?: No Type of Wheelchair: Manual           Wheelchair 50 feet with 2 turns activity    Assist    Wheelchair 50 feet with 2 turns activity did not occur: Safety/medical concerns       Wheelchair 150 feet activity     Assist  Wheelchair 150 feet activity did not occur: Safety/medical concerns       Blood pressure 114/65, pulse 83,  temperature 97.9 F (36.6 C), temperature source Oral, resp. rate 19, height 5' 11 (1.803 m), weight 105 kg, SpO2 100%. Medical Problem List and Plan: 1. 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  Undergoing medical workups as below, will monitor improvement for the next couple of days --address at teams if current level of care exceeds ability of home caregivers.  - 9-13: Bilateral lower extremity PRAFO's in bed for plantarflexion/tight heel cords  --continue CIR therapies including PT, OT, and SLP. Progress has been very slow. May need to consider other discharge venue   - 9/16: Declining with therapies; pleasantly confused today, interactions much less withdrawn but nonsensical. Will need Mod-Max A at home. Concern for physical decline exceeding family ability for caregiving if no improvements moving forward.   - 9-17 waiting on calorie counts today to determine if feeding tube needed; if patient/family continue to refuse, would recommend goals of care discussion and possible palliative care consult--patient opted for placement of tube feed  - 9-18: Due  to ongoing significant support needed to maintain blood pressure, as well as chills today, discussed additional workup/treatment with infectious disease; recommend against treatment of yeast at this time given likely contamination, given ongoing decline will get lactic acid and repeat CT abdomen and pelvis without contrast in a.m--lactic acid upper limit of normal 1.9, CT abdomen and pelvis results -right ventral pelvic wall fluid collection not changed in size from prior study, distal colonic diverticulosis, small bilateral pleural effusions and dependent lower lobe atelectasis, IR  consulted yesterday but holding off on drainage of fluid collection  - . Repeat imaging performed yesterday and shows stable fluid collection in the abdominal wall. Patient without elevated WBC, remains afebrile, collection appears similar in size and nature. Per Dr. Jenna, no aspiration or drainage planned for now.    -  9/23: Wife continues to do all cares - doing walking trials with OT to work on endurance and transfers, remains Mod-Max A to stand but once on his feet can be Min A 46 feet. PT Mod-Max A sit to stand. PO intakes much improved last 2 days.  Likely stedy; has home hospital bed without power lift so will check height for transfers.   - Family meeting today: Est Min-Mod A at home, Wife to work with patient closely over the next week to determine if able to handle needs at home.  - 9/30: Emerging Mod I cog, D3 with thins for dysphagia.  Anticipate supervision level at discharge.  Doing very well  The patient is medically ready for discharge to home and will need follow-up with [facility] PM&R. In addition, they will need to follow up with their PCP, Urology, General surgery Dr. Sheldon and ID. Did message Dr. Sheldon today to inform him of CT results and patient discharge today.     2.  H/o PE/Antithrombotics: -DVT/anticoagulation:  Pharmaceutical: continue Eliquis              -antiplatelet therapy: N/A  - No further blood appreciated in nephrostomy tubes  3. Pain Management: Oxycodone  prn.    - 9/16: Oxycodone  was removed yesterday due to concerns of this contributing to PM confusion and hypotension; nursing reports family in disagreement with this and patient pain increased. Adjust to Tylenol  PRN for mild to moderate, Percocet 5 mg for severe pain to avoid undertreatment. Monitor BP VERY closely  9-18: Has not required Percocet again since 9-15  9/21 patient reports pain is overall under control  4. Mood/Behavior/Sleep: LCSW to follow for evaluation and support.              --melatonin for  insomnia.              -antipsychotic agents: N/A  9-14: Per patient, sleeping well.  Sleep log.  Discussed delirium management with patient's wife  - 9-18 sleep log remains appropriate, no apparent hallucinations or delusions.  Continue current management  5. Neuropsych/cognition/encephalopathy likely secondary to metabolic acidosis vs delirium from prolonged illness/hospitalization: This patient is not capable of making decisions on his own behalf.  - 9-11: Increasingly withdrawn, behavioral changes may be secondary to poor nutritional intake and hyponatremia.  Treating as below, on sertraline  100 mg daily, may need to add additional agent such as mirtazapine but hesitant given ongoing cognitive issues.  9-12: Slightly better today, but consistent with hypoactive delirium with primary difficulty with concentration, no focal neurologic deficits.  Start overnight sleep log, delirium precautions.  Suspect hyponatremia as primary contributor, management as below.  9/13: D3C Flexeril , Megace , Zoloft   due to ongoing cognitive changes.  Sodium is improving.  Get LFTs, ammonia today to evaluate ongoing cognitive deficits--within normal limits.  -14: Cognition seems to be clearing a bit, continue current regimen.   9/15 alert but delayed. Continue to maximize renal/volume/nutritional status 9/16: Much more alert, but delirious; sleep log appears appropriate. Will add Zyprexa  5 mg PRN for aggitation d/t increased aggitation yesterday afternoon. Keep low dose / low likelihood of Qtc prolongation medications only.   9-18: Cognition improving very gradually, QTc remains prolonged.  DC zyprexa  due to QTC   9-22: Doing much better this week.  Continue with adequate nutrition, hydration  10/3: Cog intact at discharge    6. Skin/Wound Care: Routine pressure relief measures. Encourage adequate diet             --dressing changes daily. Continue Gerhardt's cream to buttock wound.   -abdominal wound continues to  close despite tenous nutritional status   - 9-30: Discussed with patient, will continue PICC until discharge due to difficult sticks and possibility of resuming antibiotics   - 10/3: DC PICC.  Wife set up with outpatient ostomy supplies by [facility], much appreciated.  She is very familiar with and comfortable with management of his drains and wounds.  Midline abdominal wound looking much better at discharge.  Right flank wound continues to drain  7. Fluids/Electrolytes/Nutrition/oropharyngeal dysphagia/moderate to severe protein calorie malnutrition:                --9/10 periactin  yesterday stopped d/t lethargy   -will try megace  beginning today   -RD f/u   -push fluids   -recheck bmet in am   -continue juven bid. Has been refusing Ensure supplement.   -kdur 20meq daily   -can't use zofran  d/t QTc prolongation (490)  - MBS   9/3--D3/thins, prealbumin borderline normal at 20   - 9-11: Nausea remains a primary barrier to adequate p.o. intakes.  Recheck EKG today, QTc was 460, scheduled Zofran  4 mg IV 3 times daily AC.  Will repeat EKG in next 1 to 2 days.  - 9-12: Albumin  infusion per nephrology.  P.o. intakes improving with Zofran .  per IV team midline contraindicated due to bicarb.  May get PICC line once dialysis no longer considered.  9-14: P.o. intakes okay, Zofran  continues to help with nausea.  Per nephrology, okay to reduce fluids, will keep on 70 cc/h normal saline for maintenance for now.  PICC line order placed.  9/15 intake 20-30%. Receiving mtc IVF.    -k+ 3.3, mg++ 1.3   -continue kdur 20meq daily and add 40meq to IVF   -IV mag sulfae 2g x1, add mag gluconate oral daily    9/16: Mag, K repleted. PO intakes remain very poor. Albumin  2.0. Continue maintenance fluids--will likely need TF.   9/17: Ad phosnak 1 packet x3 doses for repletion of K and Phos; repeat in AM--nephrology changed to IV--repleted 9-18  9-22: Nutrition doing much better, eating 50 to 100% of meals.  Replete  electrolytes as below. 9/23: Finishing calorie count per SLP today - PO intakes MUCH better - hopeful to remove cortrak tube otherwise will get IR consult to evaluate if PEG possible 9-24: Dietary stating patient is to keep up with 50% of his energy needs, can consider removing core track tube.  Will wait for labs in a.m. to ensure nutrition and hydration continue to be adequate before removing; did just DC free water  flushes and IV fluids 9-25: Core track DC'd, patient labs look  okay.  Really encourage p.o. fluids, resuming IV maintenance as below due to tachycardia but should really push p.o. intakes this week. 9-26: P.o. intakes really good, bring maintenance fluids down to 40 cc/h, BMP and Mag in AM 9-29: Creatinine slightly up, encourage p.o. fluids, but not quite at AKI level yet.  Other labs are stable.  Magnesium  holding up. 10-2: Creatinine remains 1.3, stable but overall elevated.  Discussed continuing to push fluids once return home, recommend getting a large water  bottle and drinking 1 for the morning and 1 through the afternoon.  Increase magnesium  supplements to 500 mg twice daily.  8. Sepsis/Abdominal wall/mesh infection: IV Vanc/Ceftriaxone  for 6 weeks from 08/21 (10/2)             --Probable 6 months total of antibiotics  --Corynebacterium species-->6 more weeks ?Dalbavancin for OP use, cont ceftriazone and linezolid  for now  per ID -appreciate recent ID f/u CRP nl 0.8 ESR 44 WBC 8.2 9-11: WBC labs remained stable. 9/15 wbc 7.1 9/16: Discussed with Dr. Dennise ID possibility of encephalopathy with current antibiotics, as well as +UA yesterday. Recommendation to continue current broad spectrum antibiotics as they are unlikely contributors and regimen has already been altered from vancomycin . Given some yeast in UA will await culture to see if secondary fungal infection present vs contaminant. -pending 9/17 9/18; -LA 1.9, CT abdomen and pelvis results  --see above 9-22: Per Dr Dennise,   then continue oritavancin  and ceftriaxone  till 10/2.  After which can do 6 more weeks on omdada+dalvance/tedezolid.  Continue Rocephin .-- will double check with ID on this timeline 9/23 9-24: Infectious disease, pharmacy working with family today on discharge antibiotic regimen from a cost intolerance perspective; I agree with trialing his home antibiotic regimen in the hospital to ensure that nausea does not limit him again 9-25: Extensive discussions with infectious disease Dr. Overton and Dr. Sheldon today; feel mesh infection is less likely, decision to remove antibiotics and monitor closely over the next week.  Patient will get CT abdomen close to discharge to reevaluate size of abscess (would order 10/2).  CRPs every 72 hours and other labs for monitoring per ID.  Patient and family understanding and agreeable with this. 10-1: CRP is up to 1.5, WBC up from 6-9, discussed with infectious disease.  Will follow-up on CT abdomen and pelvis in a.m.  No new recommendations. 10-2: CRP 1.6, WBC stable.  CT abdomen and pelvis largely unchanged.  Per ID, continue off of antibiotics, will follow-up closely as outpatient  9. Left> right hydronephrosis: Nephrostomy tubes with stent exchanged on 08/27 --continue clamping right tube and left to drain per urology.  Monitor for blood recheck CBC in am --hemoglobin stable, no further bloody output appreciated -9/15 flush left nephrostomy tube 9-18: Prior right tube site continues to drain, CT abdomen and pelvis as above - 9-27: Discussed with urology exchange of nephrostomy tubes; will address as outpatient per their service  10. Acute on chronic renal failure/hyponatremia:  elevated CR- baseline creat probably ~1.4  -9/10 will give addnl IVF today d/t poor po intake   -recheck bmet in am  - 9-11: AKI significantly worse, now with hyponatremia 131.  Will give 1 L fluid bolus normal saline today, recheck BMP; given brisk uptrend despite increase in IV fluids will  consult nephrology for further recommendations.  - 9-12: Appreciate nephrology input and recommendations.  Responding slightly to increased IV fluids overnight, normal saline 125/h.  Hyponatremia remains 131. On multiple medications that could be  contributing including Florinef , sertraline , and linezolid .  See their note for specific recommendations; advised to remain on fluids for primary treatment of hyponatremia, do not recommend salt tabs at this time.  - If renal function does not improve with fluids may need CT scan of the abdomen and pelvis to verify patency of nephrostomy/ileal conduit drainage   9-13: Potassium chloride  packet 40 mill equivalents ordered this a.m. for repletion.  Sodium improving, bicarb improving, but renal function remains unchanged on bicarb drip.  9-14: Renal function improved, acidosis resolved, sodium 134.  Per nephrology, can DC fluids, but agreeable to maintenance fluids given ongoing poor p.o. intakes.  Continue to monitor closely.  9/15-17 Cr continues to improve gradually, continue IVF, push po  9/18: On TF, DC IVF, move to FWF 200 ccs QID--monitor closely  9-19: Seems to be tolerating free water  flushes well, continuing to improve.  Continue through this weekend.     9/21 creatinine down to 1.29/BUN 40 continue current regimen and monitor  9-22: BUN 35, creatinine 1.19.  9/23: ok to check renal function 2x weekly, now GFR >60. DC FWF, encourage PO. --Labs in a.m.  9-25: Renal function stable, however resuming some IV fluids as below.  9/26: Encourage p.o., reducing IV fluids to 40 cc/h.  BMP tomorrow--can come off of IV fluids over this weekend if p.o. fluid intakes are adequate  9-29: Creatinine continues to uptrend, not quite AKI.  Encourage p.o. fluids.  Overall, doing much better.  10-1: Repeat labs today, as fluid intakes have been minimal.  Blood pressure downtrending--slight uptrend in creatinine, really encouraging patient to drink fluid!  Goal 6 to 8  250  cc cups per day.--See above, creatinine slightly up to stable, encourage p.o. fluids but will not need IV; may constitute new baseline  11. OSA: Encourage CPAP use 12. Hypotension/tachycardia/?adrenal insufficiency: Continue Midodrine  15 mg TID and Florinef  2 mg daily. Plasma cortisol 13.6 6/25/             --continues to have significant tachycardia with activity likely due to orthostasis and deconditioning.  - -hgb 11.5   -continue metoprolol  low dose 12.5mg  bid, bp remains soft but stable at present   -  IVF as above Per PT- has been not dropping in therapy   9-13: Orthostatic today, DC Lopressor  and increase midodrine  to 20 mg 3 times daily.  Has been getting a significant amount of IV fluid last few days, still likely volume depleted per nephrology.  9-14: Blood pressure remains stable, but soft. 9/15 hypotensive this morning.  Continue IVF and plan as above 9/16: A bit improved today; continue IVF, no signs of volume overload. If profoundly hypotensive again may need to transfer to acute given maximizing fluids/BP support without pressors.  9-18: DC IV fluid as above today; monitor blood pressures very closely, seem more stable than they were last week.  On chart review, was minimally responsive to trial of high-dose steroids in August; does not look like cosyntropin stim test was ever performed; Continue Florinef  for now, may need to consider if blood pressures do not improve 9-19: Blood pressure holding up in the low 100s, continue current regimen. 9/20-21 BP stable, continue to monitor  9/23: Stable -reduce midodrine  back to 15 mg 3 times daily--> 10 mg 3 times daily 9-24.  If tolerates well, will start reducing Florinef  next.  May need rate control agent although current tachycardia seems to be compensatory sinus tach 9-24: Continues to get tachycardic into the 150s with exertion,  comes down to 90s to 110s with rest.  Discussed with cardiology, appreciate recommendations, clearly  compensatory and should not treat.  Given poor p.o. fluid intakes, will resume IV normal saline at 70 cc/h for maintenance and really encourage p.o. fluids 9-26: Looking much better today.  Reduce maintenance fluids as above.  Monitor on reduced dose of Florinef  over the weekend, and if he does well would further reduce to 0.05 mg 9-29: Reduce dose of Florinef  to 0.05 mg; monitor therapy tolerance, goal to wean off before discharge--monitor for 1 more day, then if continues to stay up will DC Florinef  10-1: DC Florinef --monitor closely--blood pressure remained stable and patient remained asymptomatic.  Continue wean of midodrine  as outpatient     08/14/2024    6:58 AM 08/13/2024    9:47 PM 08/13/2024    1:00 PM  Vitals with BMI  Systolic 114 125 889  Diastolic 65 69 78  Pulse 83 88 82     13. 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  -- resolved  9/1 held reglan   -9/16: resume reglan  given low OP, nausea--with benefit, output remains low   - 9/18: Ostomy output picking up considerably with tube feeds; on fibercon BID; appreciate nutrition input--staying appropriate   - 9/23-10/1: Ostomy OP appropriate; remains quite liquid but not large-volume.  Continue current regimen.   14. Depression: On Zoloft -- Dced 9/13 due to hyponatremia and confusion   - 9/16: Family concerned for serotonin syndrome with zoloft +linezolid - no tachycardia, fevers, seizures, or severe agitation reported - encephalopathy has not improved with removal of zoloft . Low suspicion at this time.   15. Malnutrition/: Albumin  @1 .9/TP-5.1.  --> 2.2 9/11;  - 9-11: Adding Premeal Zofran  as above.  Will get nutrition to start calorie count; may need additional supplementation or discussion of feeding tube if does not improve.  - 9-12: Slightly improved, continue this regimen.  9-13: Very poor p.o. intakes yesterday.  DC Megace  due to possible cognitive effects, no perceived benefit.  Will discuss possible feeding tube  with family--patient rejects, will get calorie count for 48 hours and if no improvement may readdress tube feed versus palliative consult  9-14: Wife encouraging p.o.'s, patient motivated to avoid feeding tube.  Calorie count started.  Maintenance fluids as above.  9/16: Qtc 519; DC zofran , resume relgan 5 mg TID AC as he did well with this in the past and ostomy output has slowed considerably  9-17: QTc remains prolonged; will not resume Zofran  at this time.  Patient agreeable to placement of core track and discussed with nutrition starting with nocturnal tube feeds for supplementation, to allow appetite during the day to continue to work on increasing p.o.'s.  Will give one-time Xanax  to assist in anxiety with core track placement.  9-18: Tolerating tube feeds well, labs looking stable.  P.o. intakes poor this a.m. due to timing of therapy/medication/breakfast, continue to encourage as primary form of nutrition.  9-19: Tolerating overnight tube feeds well, p.o. intakes during the day complicated by gagging/coughing due to tube feed placement.  Chest x-ray yesterday was negative.  Orders to maintain head of bed 30 degrees whether or not tube feeds are running, and viscous lidocaine  4 times daily as needed for sensation of gagging.  9/20 nausea noted today, patient did get  dose of Compazine  and is continued on Reglan .  Will check with pharmacy to see if any new medications could be contributing.Appears he was transition to nocturnal tube feeds may be rate a little high for  him will decrease to 60 mL/h overnight.  Discussed with pharmacy he got a one-time dose of Oritivancin yesterday, he should not receive any more of this as this was a one-time order  -9-22- nausea improved with current regimen--helping considerably with p.o. intakes, may be able to DC tube feeds this week.  Dietary resumed calorie count today.  9/23: Nausea resolved; pending stable PO intakes may DC reglan  this week--DC 9-26   16.  Anemia of chronic illness: Stable overall 9/8-11  - Hemoglobin stable 9-10  9-22:Hemoglobin from 9.7->8.6   No obvious sources of bleeding, repeat in AM.  9/23: HgB stable 8.3--> 8.0--> 8.6-->9.9       Latest Ref Rng & Units 08/13/2024    4:11 AM 08/12/2024   11:02 AM 08/10/2024    4:29 AM  CBC  WBC 4.0 - 10.5 K/uL 7.8  9.6  6.6   Hemoglobin 13.0 - 17.0 g/dL 8.7  9.9  8.6   Hematocrit 39.0 - 52.0 % 27.4  31.5  27.1   Platelets 150 - 400 K/uL 364  471  321     17.  Thrombocytopenia.  Mild, 136 today.  Monitor very closely in setting of linezolid .   - 9/19: 104; per Dr. Dennise likely adjusting linezolid  to orvitatancin starting Monday.  Continue to trend  9-22: Remains downtrending, trend.  71 today.  May take a few days to bounce back from linezolid   9/23: PLT stabilized 72; monitor  9-25: Platelets starting to come back, 130s today.  Would be cautious to resume linezolid    9-29: Platelets normalized.  10-1: Platelets no uptrending, repeat labs in AM.--Back within normal range 10-2  18.  Hypophosphatemia.  1.3 to 3.1 with IV repletion, next day 1.6.  - 9-19: Per electrolyte protocol, start Phos-Nak Powder via tube feed 2 packets 4 times daily for 5 doses  - Repeat daily.  -9/20 replete today, 15mmol IV,  discussed dose pharmacy  -9/21 improved to 1.9, continue replete per pharm rec   -22: 2.3, replete today, check in a.m.--repleted  19. Low MG  -9/20 replete today 2g IV,  discussed dose pharmacy  -9/21 improved today continue to monitor   - 10-2: Increase magnesium  supplement to 500 mg twice daily   LOS: 34 days A FACE TO FACE EVALUATION WAS PERFORMED  Kristopher Thompson 08/14/2024, 9:33 AM

## 2024-08-14 NOTE — Consult Note (Signed)
 WOC Nurse Consult Note: urostomy and ileostomy Reason for Consult: prevention of leaking issues, questions regarding ostomy care  WOC Nurse ostomy follow up Stoma type/location: Urostomy White plastic stabilizing rod in place Treatment options for stomal/peristomal skin: appliance intact, had been changed out yesterday, milky effluent in pouch with mucous shreds  Ostomy pouching: 1pc + ring Education provided: reviewed skin care, frequency of change every 3-4 days, twice a week, warming up the baseplate prior to application, encouraged use of 2 barrier ring at all times. Crusting method with application layers 1-3, verbalized understanding of Crusting.   WOC Nurse ostomy follow up Stoma type/location: Ileostomy Stoma appears flat through pouch Treatment options for stomal/peristomal skin:  Semi-liquid effluent in pouch Ostomy pouching: 1pc + ring Education provided: Reviewed 1/8  clearance around stoma, skin care, keeping skin dry before application,  Enrolled patient in [employer] Discharge program: has bag of [employer] supplies in the room, possible discharge today, CG was pleased with information given and approach at experiencing with barrier rings to fill in creases and wrap around stoma, uses hand held dry at home. Bulky dressing to mid-line abd. Will remain on follow-up list. Coordinated care with primary nurse.    Sherrilyn Hals MSN RN CWOCN WOC [facility]  [phone] (Available from 7-3 pm Mon-Friday)

## 2024-08-14 NOTE — Progress Notes (Signed)
 Per wife home supplies were given yesterday.

## 2024-08-14 NOTE — Progress Notes (Signed)
 Inpatient Rehabilitation Care Coordinator Discharge Note   Patient Details  Name: Kristopher Thompson MRN: [id number] Date of Birth: 1950/12/21   Discharge location: D/c to home  Length of Stay: 33 days  Discharge activity level: Supervision  Home/community participation: Limited  Patient response un:Yzjouy Literacy - How often do you need to have someone help you when you read instructions, pamphlets, or other written material from your doctor or pharmacy?: Rarely  Patient response un:Dnrpjo Isolation - How often do you feel lonely or isolated from those around you?: Never  Services provided included: MD, RD, PT, OT, SLP, RN, TR, Pharmacy, SW, Neuropsych, CM  Financial Services:  Field seismologist Utilized: Medicare    Choices offered to/list presented to: patient and pt wife  Follow-up services arranged:  Home Health Home Health Agency: [facility] for HHPT/OT/SN/SLP; [facility] tomorrow    DME : Has all DME needed    Patient response to transportation need: Is the patient able to respond to transportation needs?: Yes In the past 12 months, has lack of transportation kept you from medical appointments or from getting medications?: No In the past 12 months, has lack of transportation kept you from meetings, work, or from getting things needed for daily living?: No   Patient/Family verbalized understanding of follow-up arrangements:  Yes  Individual responsible for coordination of the follow-up plan: contact pt wife  Confirmed correct DME delivered: Kristopher Thompson Kristopher Thompson Kristopher Thompson 08/14/2024    Comments (or additional information):fam edu completed  Summary of Stay    Date/Time Discharge Planning CSW  08/11/24 1035 Pt will d/c to home with his wife. Family to move into home to help assist. No longer requires IV abx. [facility]. SW will confirm there are no barriers to discharge. AAC  08/03/24 1223 D/c to home with wife who is primary caregiver. Pt will d/c on IVabx until 10/2.  [facility] in place from previous admission. Fam meeting on Tues (9/23) at 1pm to discuss pt plan of care and care needs.  SW will confirm there are no barriers to discharge. AAC  07/28/24 0940 D/c to home with wife who is primary caregiver. Pt will d/c on IVabx until 10/2. [facility] in place from previous admission. SW will confirm there are no barriers to discharge. AAC  07/20/24 1441 D/c to home with wife who is primary caregiver. Pt will d/c on IVabx until 10/2. [facility] in place from previous admission. SW will confirm there are no barriers to discharge. AAC  07/13/24 1200 D/c to home with wife who is primary caregiver. Pt will d/c on IVabx until 10/2. [facility] in place from previous admission. SW will confirm there are no barriers to discharge. AAC       Kristopher Thompson A Kristopher Thompson

## 2024-08-14 NOTE — Telephone Encounter (Signed)
 Pharmacy Patient Advocate Encounter   Received notification from Inpatient Request that prior authorization for hydrOXYzine  HCl 25MG  tablets  is required/requested.   Insurance verification completed.   The patient is insured through [facility].   Per test claim: PA required; PA submitted to above mentioned insurance via Latent Key/confirmation #/[facility] Status is pending

## 2024-08-14 NOTE — Progress Notes (Signed)
 Speech Language Pathology Discharge Summary  Patient Details  Name: Kristopher Thompson MRN: [id number] Date of Birth: 1951-09-14  Date of Discharge from SLP service:August 13, 2024   Patient has met 5 of 5 long term goals.  Patient to discharge at overall Supervision level.  Reasons goals not met: n/a   Clinical Impression/Discharge Summary:  Overall, excellent progress noted this admission. He initially demonstrated highly variable success given medical complications and delirium. However, the past 1-2 weeks, steady progress was evident. LTGs were upgraded to supervision, which he met 5/5 of. He demonstrates much improved attention, memory, problem solving, and information processing. Very mild deficits remain in these cognitive domains, and he would benefit from continued ST upon d/c. He tolerates a D3 diet w/ thin liquids @ modI and reports improved pharyngeal clearance. Pt/family education complete and he is set to d/c home w/ 24/7 care as needed. Recommend cont ST upon d/c to target cognition, maximize pt independence, and reduce caregiver burden.     Care Partner:  Caregiver Able to Provide Assistance: Yes  Type of Caregiver Assistance: Physical;Cognitive  Recommendation:  Home Health SLP;24 hour supervision/assistance  Rationale for SLP Follow Up: Maximize cognitive function and independence;Reduce caregiver burden   Equipment:     Reasons for discharge: Discharged from hospital   Patient/Family Agrees with Progress Made and Goals Achieved: Yes    [name] 08/14/2024, 9:17 AM

## 2024-08-14 NOTE — Progress Notes (Signed)
 PICC removed per protocol per MD order. Manual pressure applied for 5 mins. Vaseline gauze, gauze, and metapore applied over insertion site. No bleeding or swelling noted. Instructed patient to remain in bed for thirty mins. Educated patient about S/S of infection and when to call MD; no heavy lifting or pressure on R side for 24 hours; keep dressing dry and intact for 24 hours. Pt verbalized comprehension.

## 2024-08-14 NOTE — Telephone Encounter (Signed)
 Pharmacy Patient Advocate Encounter  Received notification from [facility] that Prior Authorization for hydrOXYzine  HCl 25MG  tablets  has been APPROVED from 08/14/2024 to 08/14/2025   PA #/Case ID/Reference #: [id number]

## 2024-08-14 NOTE — Plan of Care (Signed)
  Problem: Consults Goal: RH GENERAL PATIENT EDUCATION Description: See Patient Education module for education specifics. Outcome: Progressing   Problem: RH BOWEL ELIMINATION Goal: RH STG MANAGE BOWEL WITH ASSISTANCE Description: STG Manage Bowel with supervision Assistance. Outcome: Progressing   Problem: RH BLADDER ELIMINATION Goal: RH STG MANAGE BLADDER WITH ASSISTANCE Description: STG Manage Bladder With supervision Assistance Outcome: Progressing   Problem: RH SKIN INTEGRITY Goal: RH STG SKIN FREE OF INFECTION/BREAKDOWN Description: Manage skin free of infection with supervision assistance Outcome: Progressing   Problem: RH SAFETY Goal: RH STG ADHERE TO SAFETY PRECAUTIONS W/ASSISTANCE/DEVICE Description: STG Adhere to Safety Precautions With Assistance/Device. Outcome: Progressing   Problem: RH PAIN MANAGEMENT Goal: RH STG PAIN MANAGED AT OR BELOW PT'S PAIN GOAL Description: <4 w/ prns Outcome: Progressing   Problem: RH KNOWLEDGE DEFICIT GENERAL Goal: RH STG INCREASE KNOWLEDGE OF SELF CARE AFTER HOSPITALIZATION Description: Manage increase knowledge of self care care after hospitalization with supervision assistance from wife using educational materials provided Outcome: Progressing

## 2024-08-14 NOTE — Plan of Care (Signed)
  Problem: RH Swallowing Goal: LTG Patient will consume least restrictive diet using compensatory strategies with assistance (SLP) Description: LTG:  Patient will consume least restrictive diet using compensatory strategies with assistance (SLP) Outcome: Completed/Met Goal: LTG Pt will demonstrate functional change in swallow as evidenced by bedside/clinical objective assessment (SLP) Description: LTG: Patient will demonstrate functional change in swallow as evidenced by bedside/clinical objective assessment (SLP) Outcome: Completed/Met   Problem: RH Expression Communication Goal: LTG Patient will verbally express basic/complex needs(SLP) Description: LTG:  Patient will verbally express basic/complex needs, wants or ideas with cues  (SLP) Outcome: Completed/Met   Problem: RH Problem Solving Goal: LTG Patient will demonstrate problem solving for (SLP) Description: LTG:  Patient will demonstrate problem solving for basic/complex daily situations with cues  (SLP) Outcome: Completed/Met   Problem: RH Memory Goal: LTG Patient will demonstrate ability for day to day (SLP) Description: LTG:   Patient will demonstrate ability for day to day recall/carryover during cognitive/linguistic activities with assist  (SLP) Outcome: Completed/Met

## 2024-08-17 ENCOUNTER — Other Ambulatory Visit (HOSPITAL_COMMUNITY): Payer: Self-pay | Admitting: Urology

## 2024-08-17 DIAGNOSIS — N135 Crossing vessel and stricture of ureter without hydronephrosis: Secondary | ICD-10-CM

## 2024-08-18 NOTE — Discharge Summary (Signed)
 Physician Discharge Summary  Patient ID: Kristopher Thompson MRN: [id number] DOB/AGE: 1951/07/02 73 y.o.  Admit date: 07/11/2024 Discharge date: 08/14/2024  Discharge Diagnoses:  Principal Problem:   Critical illness myopathy Active Problems:   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 08/08/2017   Sepsis due to undetermined organism (HCC)   Chronic anticoagulation   History of bladder cancer   Personal history of PE (pulmonary embolism)   CKD stage 3b, GFR 30-44 ml/min (HCC)   Adjustment disorder with depressed mood   Cognitive and behavioral changes   Malnutrition of moderate degree   Discharged Condition: stable  Significant Diagnostic Studies: CT ABDOMEN PELVIS WO CONTRAST Result Date: 08/13/2024 CLINICAL DATA:  Intra-abdominal abscess monitorring for improvment. EXAM: CT ABDOMEN AND PELVIS WITHOUT CONTRAST TECHNIQUE: Multidetector CT imaging of the abdomen and pelvis was performed following the standard protocol without IV contrast. RADIATION DOSE REDUCTION: This exam was performed according to the departmental dose-optimization program which includes automated exposure control, adjustment of the mA and/or kV according to patient size and/or use of iterative reconstruction technique. COMPARISON:  CT scan abdomen and pelvis from 07/31/2024. FINDINGS: Lower chest: The lung bases are clear. There is trace left pleural effusion. No right pleural effusion. The heart is normal in size. No pericardial effusion. Hepatobiliary: The liver is normal in size. Non-cirrhotic configuration. No suspicious mass. No intrahepatic or extrahepatic bile duct dilation. Gallbladder is surgically absent. Pancreas: Redemonstration of mild-to-moderate diffuse pancreatic atrophy and multiple sub 5 mm dystrophic calcifications throughout the pancreas, compatible with sequela of chronic pancreatitis. There is a well-circumscribed approximately 1.2 x 1.3 cm hypoattenuating area along the anterior aspect of the  pancreatic body/tail junction region, incompletely characterized on the current exam but grossly similar to multiple prior studies. No peripancreatic fat stranding. Main pancreatic duct is not dilated. Spleen: Within normal limits. No focal lesion. Adrenals/Urinary Tract: Adrenal glands are unremarkable. Redemonstration of at least moderate diffuse atrophy of left kidney. Bilateral nephrostomy tubes are again seen. There is mild right hydronephrosis, overall similar to the recent prior study. There is also right-sided ureteric stent, inserted through right lower quadrant urostomy approach. No left hydronephrosis. No nephroureterolithiasis on either side. Urinary bladder is surgically absent. There is right lower quadrant urostomy. Stomach/Bowel: There is a small sliding hiatal hernia. No disproportionate dilation of the small or large bowel loops. No evidence of abnormal bowel wall thickening or inflammatory changes. The appendix was not visualized; however there is no acute inflammatory process in the right lower quadrant. There are multiple diverticula mainly in the left hemi colon, without imaging signs of diverticulitis. Vascular/Lymphatic: No ascites or pneumoperitoneum. No abdominal or pelvic lymphadenopathy, by size criteria. No aneurysmal dilation of the major abdominal arteries. There are mild peripheral atherosclerotic vascular calcifications of the aorta and its major branches. Reproductive: Patient is status post surgical removal of prostate and bilateral seminal vesicles. Other: Midline surgical scar noted. Right mid abdominal and right lower quadrant ostomy noted. Redemonstration of heterogeneous collection along the right lower flank region adjacent to the right-sided ureteric stent without interval change. The soft tissues and abdominal wall are otherwise unremarkable. Musculoskeletal: No suspicious osseous lesions. There are mild - moderate multilevel degenerative changes in the visualized spine.  IMPRESSION: 1. No significant interval change since the recent prior study. 2. Redemonstration of heterogeneous collection along the right lower flank region adjacent to the right-sided ureteric stent without interval change. 3. Multiple other nonacute observations, as described above. Aortic Atherosclerosis (ICD10-I70.0). Electronically Signed   By: Ree  Kimberlee M.D.   On: 08/13/2024 15:05   DG CHEST PORT 1 VIEW Result Date: 08/04/2024 CLINICAL DATA:  Edema EXAM: PORTABLE CHEST 1 VIEW COMPARISON:  July 30, 2024 FINDINGS: Right PICC tip terminates in cavoatrial junction. Enteric tube tip terminates within the gastric lumen. Cholecystectomy clips in right upper quadrant. The heart size and mediastinal contours are within normal limits. Both lungs are clear. The visualized skeletal structures are unremarkable. IMPRESSION: No active disease. Supportive tubings as detailed above. Electronically Signed   By: Megan  Zare M.D.   On: 08/04/2024 19:35   CT ABDOMEN PELVIS WO CONTRAST Result Date: 07/31/2024 CLINICAL DATA:  Sepsis EXAM: CT ABDOMEN AND PELVIS WITHOUT CONTRAST TECHNIQUE: Multidetector CT imaging of the abdomen and pelvis was performed following the standard protocol without IV contrast. RADIATION DOSE REDUCTION: This exam was performed according to the departmental dose-optimization program which includes automated exposure control, adjustment of the mA and/or kV according to patient size and/or use of iterative reconstruction technique. COMPARISON:  07/02/2024 FINDINGS: Lower chest: Small free-flowing bilateral pleural effusions with dependent lower lobe atelectasis. Hepatobiliary: Prior cholecystectomy. Unremarkable unenhanced appearance of the liver. Pancreas: Stable pancreatic parenchymal atrophy with coarse pancreatic calcifications consistent with sequela of chronic calcific pancreatitis. No acute inflammatory changes. Spleen: Unremarkable unenhanced appearance. Adrenals/Urinary Tract: There  are bilateral percutaneous nephrostomy tubes again identified, coiled within the bilateral renal pelves. A right ureteral stent extends from the right renal pelvis into the right lower quadrant urostomy. Mild bilateral hydronephrosis is identified, stable on the right but improved on the left since prior exam. The adrenals are unremarkable. Prior cystectomy with right lower quadrant urinary diversion again identified. Stomach/Bowel: No bowel obstruction or ileus. Distal colonic diverticulosis without evidence of acute diverticulitis. No bowel wall thickening or inflammatory change. Enteric catheter is identified, tip within the proximal duodenum. Vascular/Lymphatic: Aortic atherosclerosis. No enlarged abdominal or pelvic lymph nodes. Reproductive: Prior prostatectomy. Other: No free fluid or free intraperitoneal gas. Interval removal of the percutaneous drain within the right lower quadrant abdominal wall adjacent to the diverting urostomy. Punctate foci of gas are seen within the fluid collection within the right ventral pelvic wall, with no appreciable change change in the size of the fluid collection since prior study. Please correlate with the timing of the drain removal, as this could explain the increased gas within the collection. Otherwise, infection could be considered. Stable fat containing right inguinal hernia.  No bowel herniation. Musculoskeletal: No acute or destructive bony abnormalities. Reconstructed images demonstrate no additional findings. IMPRESSION: 1. Stable bilateral percutaneous nephrostomy tubes and right ureteral stent. Mild bilateral hydronephrosis, stable on the right and improved on the left since prior exam. 2. Right ventral pelvic wall fluid collection, not appreciably changed in size since prior study. There are increased punctate foci of gas within the fluid collection, which could be due to recent drain removal or superimposed infection. 3. Distal colonic diverticulosis without  diverticulitis. 4. Small bilateral pleural effusions and dependent lower lobe atelectasis. 5.  Aortic Atherosclerosis (ICD10-I70.0). Electronically Signed   By: Ozell Daring M.D.   On: 07/31/2024 16:36   DG CHEST PORT 1 VIEW Result Date: 07/30/2024 CLINICAL DATA:  Cough. EXAM: PORTABLE CHEST 1 VIEW COMPARISON:  Chest radiograph dated 07/27/2024. FINDINGS: Right-sided PICC with tip at the cavoatrial junction. Enteric tube extends below the diaphragm with tip beyond the inferior margin of the image. No focal consolidation, pleural effusion, pneumothorax. The cardiac silhouette is within limits no acute osseous pathology. IMPRESSION: 1. No active disease. 2. Right-sided  PICC with tip at the cavoatrial junction. Electronically Signed   By: Vanetta Chou M.D.   On: 07/30/2024 19:56   CT HEAD WO CONTRAST ( ) Result Date: 07/28/2024 CLINICAL DATA:  confusion EXAM: CT HEAD WITHOUT CONTRAST TECHNIQUE: Contiguous axial images were obtained from the base of the skull through the vertex without intravenous contrast. RADIATION DOSE REDUCTION: This exam was performed according to the departmental dose-optimization program which includes automated exposure control, adjustment of the mA and/or kV according to patient size and/or use of iterative reconstruction technique. COMPARISON:  CT head April 16, 2024. FINDINGS: Brain: No evidence of acute infarction, hemorrhage, hydrocephalus, extra-axial collection or mass lesion/mass effect. Vascular: No hyperdense vessel or unexpected calcification. Skull: No acute fracture. Sinuses/Orbits: Clear sinuses.  No acute orbital findings. Other: No mastoid effusions. IMPRESSION: No evidence of acute intracranial abnormality. Electronically Signed   By: Gilmore GORMAN Molt M.D.   On: 07/28/2024 03:47   DG CHEST PORT 1 VIEW Result Date: 07/27/2024 CLINICAL DATA:  Shortness of breath EXAM: PORTABLE CHEST 1 VIEW COMPARISON:  07/01/2024 FINDINGS: Cardiac shadow is stable. Right PICC is  noted in satisfactory position. The lungs are clear. No bony abnormality is noted. IMPRESSION: No active disease. Electronically Signed   By: Oneil Devonshire M.D.   On: 07/27/2024 20:32   US  EKG SITE RITE Result Date: 07/26/2024 If Site Rite image not attached, placement could not be confirmed due to current cardiac rhythm.   Labs:  Basic Metabolic Panel:    Latest Ref Rng & Units 08/13/2024    4:11 AM 08/12/2024   11:02 AM 08/10/2024    4:29 AM  BMP  Glucose 70 - 99 mg/dL 86  862  896   BUN 8 - 23 mg/dL 23  21  21    Creatinine 0.61 - 1.24 mg/dL 8.64  8.69  8.74   Sodium 135 - 145 mmol/L 138  137  141   Potassium 3.5 - 5.1 mmol/L 4.1  4.5  4.1   Chloride 98 - 111 mmol/L 112  110  114   CO2 22 - 32 mmol/L 17  18  17    Calcium  8.9 - 10.3 mg/dL 9.1  9.5  8.7      CBC:    Latest Ref Rng & Units 08/13/2024    4:11 AM 08/12/2024   11:02 AM 08/10/2024    4:29 AM  CBC  WBC 4.0 - 10.5 K/uL 7.8  9.6  6.6   Hemoglobin 13.0 - 17.0 g/dL 8.7  9.9  8.6   Hematocrit 39.0 - 52.0 % 27.4  31.5  27.1   Platelets 150 - 400 K/uL 364  471  321      CBG: No results for input(s): GLUCAP in the last 168 hours.  Brief HPI:   Kristopher Thompson is a 73 y.o. male with history of OSA, HFpEF, CAD, PE-on Eliquis , prostate CA s/p cryoprostatectomy and ileal conduit with recent admission on 04/10/24  for parastomal incisional abdominal wall hernia repair with graft and ileal conduit complicated by intra abdominal and abdominal wall abscess from bowel perforation s/p ileostomy, acute on chronic renal failure, leak from distal end of conduit s/p bilateral nephrostomy tube placement treated with 4 week course of antibiotics and CIR with discharge to home on 06/19/24. He was readmitted on 07/01/24 with fatigue, MS changes, tachycardia improved drainage from abdominal wall drain.  He was started on broad-spectrum antibiotics due to concerns of RLL aspiration pneumonia and acute pyelonephritis.  CT abdomen pelvis revealed  left hydronephrosis and hydroureter and nephrostomy tubes were uncapped by urology.  Urine culture showed MRSA and E Faecium and JP drain was positive for E. coli and Cornybacterium species.  Acute renal failure felt to be due to obstruction as well as poor p.o. intake and dehydration and has resolved with hydration.  Abdominal wall drain was removed on 08/21 by Dr. Wolm due to concerns of infection and Imodium  and fiber was added to thicken ileostomy effluent.  She underwent exchange of bilateral PCN tubes with replacement of right ureteral stent by Dr. Jenna and right nephrostomy tube remain capped.  He continues to have drainage from prior right drain site as well as hypotension despite IV hydrocortisone .  Dr. Arminda recommended 6 weeks of IV Vanco, ceftriaxone  followed by oral suppression for possible 6 months of antibiotic coverage.  PT OT was consulted and patient was requiring mod to max assist for pre-gait activity and was able to stand for a few seconds.  He was also noted to have cognitive deficits requiring increased time for processing with decreasing verbal output and needed verbal and gestural cues to perform ADL tasks.  CIR consulted due to functional decline.     Hospital Course: Kristopher Thompson was admitted to rehab 07/11/2024 for inpatient therapies to consist of PT, ST and OT at least three hours five days a week. Past admission physiatrist, therapy team and rehab RN have worked together to provide customized collaborative inpatient rehab.  He continued to have issues with poor p.o. intake despite encouragement.  Juven and ensure added to help promote wound healing. Hypokalemia has resolved with supplementation Nausea has also been a limiting factor, Zofran  was scheduled with monitoring of QTc and IVF added to help maintain hydration. He continued to have depressed mood, was withdrawn and exhibited signs of hypoactive delirium.  He developed acute on chronic renal failure with hyponatremia  and low phosphorous level. Dr. DOROTHA Blanch consulted and recommended strict I/O as hyponatremia fel to be due poor intake with hypotension and exacerbated by administration of hypotonic fluid and ongoing nausea. IVF changed to normal saline with improvement in sodium level as well as improvement renal function.    On 09/16 he was noted to have increasing confusion with nonsensical speech and CT head was done which was negative for stroke or acute changes. Delirium precautions have been ongoing and flexeril  and megace  d/c.  LFTs and ammonia levels drawn and WNL. Zyprexa  d/c due to prolonged QTc. Discussion had with wife and family with decision made to place cortak for nutritional supplementation on 09/18.  He has had improvement in activity tolerance and well as mood with adequate nutrition and hydration. He was transitioned to nocturnal feeds and as intake improved, cortak was removed on 09/25 with IVF added to help maintain hydration status and to manage tachycardia.   ID has been consulted during his stay for input vancomycin  changed to linezolid  due to worsening of renal function. CT abdomen pelvis also repeated 09/18 showing no change in right ventral pelvic wall collection with increased foci of gas within fluid collection question due to recent drain removal or superimposed infection.  Case discussed with Dr. Jenna who did not feel aspiration or drainage indicated. ID recommended monitoring as patient without fevers, signs of sepsis and white count stable. Dr. Dennise recommended  transition of linezolid  to oritavancin  on 09/19 due to drop in platelets with plans for 6 months of antibiotic regimen. Cost of medication discussed and noted to be prohibitive. Extensive discussions  were had with Dr.Vu and Dr. Sheldon who felt  mesh infection less likely. This was also discussed with patient and family.  He was monitored off antibiotics X a week with CRP every 72 hours and repeat CT A/P on 10/02 whichshowed no  significant change in fluid collection and no peripancreatic fat stranding   Nephrology has also been consulted for input on intermittent issues with milky drainage, episode of bloody as well as yeast which was noted in urine.  Recommendations were to monitor with flushes which has been effective and yeast did not need to be treated per ID/nephrology input.  His blood pressures and heart rate  have been monitored during his stay and florinef  has been weaned off.  and midodrine  tapered to least amount needed. IVF have gradually been weaned off as intake stabilized. Albumin  has improved from drop to 1.9 to 2.4 with prealbumin @ 23. Low magnesium  levels have been supplement and oral supplement increased to 500 mg bid. Thrombocytopenia has resolved and ABLA/WBC has been stable. Most recent CRP @ 1.6 on 10/02.  BMET shows sodium is stable and renal status at baseline around 1.3.   Wife has been present to provide ego support thorough out his stay. He has made great gains in the past few weeks and is discharged at supervision level.  He will continue to receive follow up HHPT and HHRN by [facility] after discharge.    Rehab course: During patient's stay in rehab weekly team conferences were held to monitor patient's progress, set goals and discuss barriers to discharge. At admission, patient required mod to max assist with ADL tasks and max assist with mobility.  He exhibited cognitive linguistic deficits with SLUMS score 20/30. He  has had improvement in activity tolerance, balance, postural control as well as ability to compensate for deficits. He is able to complete ADL tasks with CGA to supervision. He requires supervision with cues for tranfers and to ambulate 61' with use of RW. He has made cognitive gains with very mild residual cognitive deficits and needs supervision with cognitive tasks. Family education has been completed.   Discharge disposition: 06-Home-Health Care Svc  Diet: Regular/as  tolerated  Special Instructions: Recommend repeat BMET/Mg and CBC in 1-2 weeks for monitoring.  Continue routine urostomy and ostomy care.  3 Need to keep up with fluid intake as well as nutritional supplements to help maintain nutritional status.   Allergies as of 08/14/2024       Reactions   Demerol  [meperidine ] Nausea And Vomiting        Medication List     STOP taking these medications    cefTRIAXone  IVPB Commonly known as: ROCEPHIN    cyclobenzaprine  5 MG tablet Commonly known as: FLEXERIL    cyproheptadine  4 MG tablet Commonly known as: PERIACTIN    FeroSul 325 (65 Fe) MG tablet Generic drug: ferrous sulfate    fludrocortisone  0.1 MG tablet Commonly known as: FLORINEF    Metamucil Fiber 2 g Chew   metoCLOPramide  5 MG tablet Commonly known as: REGLAN    Mintox Maximum Strength 400-400-40 MG/5ML suspension Generic drug: alum & mag hydroxide-simeth   MULTIVITAMIN PO   oxyCODONE -acetaminophen  5-325 MG tablet Commonly known as: Percocet   pantoprazole  40 MG tablet Commonly known as: PROTONIX    prochlorperazine  5 MG tablet Commonly known as: COMPAZINE    psyllium 95 % Pack Commonly known as: HYDROCIL/METAMUCIL   sertraline  100 MG tablet Commonly known as: ZOLOFT    vancomycin  IVPB       TAKE these medications  acetaminophen  325 MG tablet Commonly known as: TYLENOL  Take 1-2 tablets (325-650 mg total) by mouth every 4 (four) hours as needed for mild pain (pain score 1-3).   childrens multivitamin chewable tablet Chew 1 tablet by mouth 2 (two) times daily.   cyanocobalamin  1000 MCG tablet Commonly known as: VITAMIN B12 Take 0.5 tablets (500 mcg total) by mouth every Monday, Wednesday, and Friday. What changed: medication strength   Eliquis  5 MG Tabs tablet Generic drug: apixaban  Take 1 tablet (5 mg total) by mouth 2 (two) times daily.   feeding supplement Liqd Take 237 mLs by mouth 2 (two) times daily between meals. What changed:  when to  take this Another medication with the same name was removed. Continue taking this medication, and follow the directions you see here.   Fish Oil 1000 MG Caps Take 1,000 mg by mouth every Monday, Wednesday, and Friday.   FT Fiber Laxative 625 MG tablet Generic drug: polycarbophil Take 1 tablet (625 mg total) by mouth 2 (two) times daily.   Gerhardt's butt cream Crea Apply 1 Application topically 2 (two) times daily.   hydrOXYzine  25 MG tablet Commonly known as: ATARAX  Take 1 tablet (25 mg total) by mouth 3 (three) times daily as needed for anxiety.   ipratropium 0.03 % nasal spray Commonly known as: ATROVENT  Place 1 spray into both nostrils every 8 (eight) hours as needed (allergies).   leptospermum manuka honey Pste paste Apply 1 Application topically daily.   loperamide  2 MG capsule Commonly known as: IMODIUM  Take 2 capsules (4 mg total) by mouth 3 (three) times daily. What changed: when to take this   Mag-G 500 (27 Mg) MG Tabs tablet Generic drug: magnesium  gluconate Take 1 tablet (500 mg total) by mouth 2 (two) times daily with a meal.   melatonin 3 MG Tabs tablet Take 1 tablet (3 mg total) by mouth at bedtime.   midodrine  5 MG tablet Commonly known as: PROAMATINE  Take 2 tablets (10 mg total) by mouth 3 (three) times daily with meals. What changed: how much to take   potassium chloride  10 MEQ tablet Commonly known as: KLOR-CON  M Take 1 tablet (10 mEq total) by mouth daily.   rosuvastatin  10 MG tablet Commonly known as: CRESTOR  Take 1 tablet (10 mg total) by mouth every evening.   sodium chloride  0.65 % Soln nasal spray Commonly known as: OCEAN Place 1-2 sprays into both nostrils every 6 (six) hours as needed for congestion (dry nose).        Follow-up Information     [name], MD Follow up.   Specialty: Internal Medicine Why: Call in 1-2 days for post hospital follow up Contact information: [address] [place]  [address] [phone]         Emeline Search C, DO Follow up.   Specialty: Physical Medicine and Rehabilitation Why: office will call you with follow up appointment Contact information: [address] [place] [address] [phone]         [name], MD Follow up in 1 month(s).   Specialties: General Surgery, Colon and Rectal Surgery Why: To have your wound re-checked Contact information: [address] [place] [address] [phone]         [name], MD Follow up in 2 week(s).   Specialty: Urology Why: to follow up on nephrostomy tubes/stents Contact information: [address] [place] [address] [phone]         Overton Faith  T, MD. Call.   Specialty: Infectious Diseases Why: As needed Contact information: [address] [place] [address] [place] [address] [phone]                 Signed: Sharlet GORMAN Schmitz 08/18/2024, 3:52 PM

## 2024-08-26 ENCOUNTER — Ambulatory Visit (HOSPITAL_COMMUNITY)
Admission: RE | Admit: 2024-08-26 | Discharge: 2024-08-26 | Disposition: A | Discharge status: Home / Self Care | Source: Ambulatory Visit | Attending: Urology | Admitting: Urology

## 2024-08-26 ENCOUNTER — Encounter (HOSPITAL_COMMUNITY): Payer: Self-pay

## 2024-08-26 DIAGNOSIS — N135 Crossing vessel and stricture of ureter without hydronephrosis: Secondary | ICD-10-CM

## 2024-08-27 ENCOUNTER — Ambulatory Visit: Admitting: Internal Medicine

## 2024-08-31 ENCOUNTER — Ambulatory Visit (HOSPITAL_COMMUNITY): Admission: RE | Admit: 2024-08-31 | Discharge: 2024-08-31 | Attending: Urology | Admitting: Urology

## 2024-08-31 ENCOUNTER — Other Ambulatory Visit (HOSPITAL_COMMUNITY): Payer: Self-pay | Admitting: Urology

## 2024-08-31 DIAGNOSIS — N135 Crossing vessel and stricture of ureter without hydronephrosis: Secondary | ICD-10-CM

## 2024-08-31 HISTORY — PX: IR NEPHROSTOMY EXCHANGE LEFT: IMG6069

## 2024-08-31 HISTORY — PX: IR NEPHROSTOGRAM RIGHT THRU EXISTING ACCESS: IMG6062

## 2024-08-31 HISTORY — PX: IR NEPHRO TUBE REMOV/FL: IMG2342

## 2024-08-31 MED ORDER — IOHEXOL 300 MG/ML  SOLN
50.0000 mL | Freq: Once | INTRAMUSCULAR | Status: AC | PRN
Start: 2024-08-31 — End: 2024-08-31
  Administered 2024-08-31: 15 mL

## 2024-08-31 MED ORDER — LIDOCAINE HCL 1 % IJ SOLN
INTRAMUSCULAR | Status: AC
  Filled 2024-08-31: qty 20

## 2024-09-07 ENCOUNTER — Ambulatory Visit: Admitting: Internal Medicine

## 2024-09-07 NOTE — Progress Notes (Signed)
 REFERRING PHYSICIAN:  Self  Patient Care Team: Kristopher Thompson ORN, MD as PCP - General (Internal Medicine) Kristopher Thompson., MD (Urology) Gross, Kristopher Bitter, MD as Consulting Provider (General Surgery) Kristopher Thompson (Pulmonary Disease)  PROVIDER:  ELSPETH Thompson SCHULTZE, MD  [name]: [id number] DOB: 05/20/1951 DATE OF ENCOUNTER: 09/07/2024  Subjective   Chief Complaint: Post Operative Visit     History of Present Illness: Kristopher Thompson is a 73 y.o. male who is seen today as an office consultation at the request of Dr. 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   PROCEDURE:   -ROBOTIC LYSIS OF ADHESIONS X 4 HOURS -COMPONENT SEPARATION - TRANSVERSUS ABDOMINIS REALEASE (TAR) BILATERAL -ROBOTIC REPAIRS OF  INCISIONAL, PARASTOMAL, LEFT INGUINAL  INCARCERATED ABDOMINAL WALL HERNIAS WITH MESH -UROSTOMY ILEAL CONDUIT REVISION -INTRAOPERATIVE ASSESSMENT OF TISSUE VASCULAR PERFUSION USING ICG (indocyanine green ) -IMMUNOFLUORESCENCE -TRANSVERSUS ABDOMINIS PLANE (TAP) BLOCK - BILATERAL    SURGEON:  Kristopher KYM Schultze, MD     04/20/2024 POST-OPERATIVE DIAGNOSIS:  perforated bowel   PROCEDURE:  Drainage of right abdominal wall abscess as well as intra-abdominal abscess Explantation of abdominal mesh Small bowel resection (in discontinuity) Placement of ABThera wound VAC   SURGEON:  Thompson Locus, MD   04/21/2024   POST-OPERATIVE DIAGNOSIS:  DELAYED BOWEL PERFORATION WITH OPEN ABDOMEN   PROCEDURE:   LYSIS OF ADHESIONS X ABDOMINAL WALL DEBRIDEMENT ILEAL RESECTION END ILEOSTOMY ABDOMINAL WALL PARASTOMAL & INCISIONAL HERNIA REPAIRS WITH PHASIX MESH FASCIA CLOSURE WITH WOUND VAC PLACEMENT IN SQ   SURGEON:  Kristopher KYM Schultze, MD     05/04/2024 Interventional Radiology Procedure Note   Procedure: Bilateral percutaneous nephrostomy tube placements    Findings: Please refer to procedural dictation for full description. 10 Fr bilateral to bag drainage.   Complications: None immediate   Estimated Blood Loss: < 5 ml   Recommendations: Keep to bag drainage.  IR will follow.  Ultimate management as per Urology.    Patient returns for follow-up.  Had challenging repair of parastomal and incisional hernias complicated by delayed bowel perforation and then delayed ileal conduit urine leak.  Very prolonged hospital stay with rehab.  Leak controlled with urinary diversion with nephrostomy tubes and stenting across left ureteral ileal conduit.  Right nephrostomy tube out.  Left side open with drain.   Abdominal wall drain became infected and was removed.  Has been followed by infectious disease since he has grown atypical organisms on culture.  Concern for possible colonization so prolonged antibiotics.  Prolonged rehab stay.  Some problems eating on antibiotics eventually decided to hold on antibiotics.  It has been several weeks since he left rehab.  This been getting midline dressing changes.  He followed up with urology.  Right nephrostomy tube out.  Left nephrostomy tube and stent across left ureter/ileal conduit in place to try and salvage the left kidney.    No more drainage from the right nephrostomy tube.  Left nephrostomy tube no major issues.  He is emptying his fecal ileostomy about every 3-5 hours.  Takes fiber.  Takes Imodium  4 mg 3 times daily.  The midline wound there just washing with saline and putting K-Y jelly on it.  Changing once a day.  The right flank old drain site is still draining.  It is serous and thin.  No longer purulent.  Has to change the dressing at least once a day.  His  appetite is better.  Energy level down but starting to use the walker and get out of the wheelchair more.  PRIOR VISIT 03/02/2024 Patient returns for follow-up.  It has been 2-1/2 years.  Has midline incisional and very large parastomal hernia around  his ileal conduit from her prior cystectomy also had a diverting colostomy that was taken down.  Patient notes that he feels like the hernia is gotten larger.  He is getting more constipated need to take more like MiraLAX  to improve his bowels.  Does not like wearing a binder as it causes discomfort.  He is moderately active.  He is to walk several miles a day but with his knee he goes maybe a mile a day.  He did get a pulmonary embolism and is now anticoagulated on Eliquis .  Followed by pulmonologist up in [place] .  Still followed by Kristopher Thompson with [facility] with no evidence of any cancer recurrence since the initial resection 2018.  He is able to keep a urostomy appliance on his conduit in his right lower quadrant.  PRIOR NOTE 07/24/2021 Pleasant obese male.  He required cystectomy with urostomy ileal conduit as well as rectal.  Diverting colostomy 2018 for a bladder cancer.  Had colostomy takedown last year.  No evidence of cancer recurrence.  He has had hernias for some time.  However they have gotten larger.  Noticed on a screening colonoscopy by Dr. Debby.  Patient complaining of intermittent pain.  Surgical consultation offered.  Patient comes by himself.  He notes that he will get intermittent episodes of sharp abdominal pain.  Not quite nausea.  He is taken some stool softener.  Has not had an episode in a month.  He can walk his 2 miles a day usually.  He does not smoke since he quit around the diagnosis of his bladder cancer.  He is obese.  Weight is stable.  No cardiac or pulmonary issues.  Not on any blood thinners..  No diabetes.  No sleep apnea.  He had an appendectomy in 1972.  Cholecystectomy in 83.  Usually moves his bowels once or twice a day.  Able to keep his urostomy appliance on for at least 2 or 3 days.  Changes out of habit.  No major leaking or ulcerations.  Most discomfort around the ostomy on the right side.  Medical History: Past Medical History:  Diagnosis  Date  . Chronic kidney disease   . GERD (gastroesophageal reflux disease)   . History of cancer     Patient Active Problem List  Diagnosis  . Parastomal hernia of ileal conduit  . Incisional hernia, without obstruction or gangrene  . Obesity (BMI 35.0-39.9 without comorbidity), unspecified  . History of bladder cancer  . Status post ileal conduit (CMS/HHS-HCC)  . Hearing loss  . Generalized abdominal pain  . Partial small bowel obstruction (CMS/HHS-HCC)  . Personal history of PE (pulmonary embolism)  . Chronic anticoagulation    Past Surgical History:  Procedure Laterality Date  . APPENDECTOMY    . bladder cancer  N/A   . ex lap/ ileostomy in place    . prostate cancer N/A      Allergies  Allergen Reactions  . Meperidine  Nausea And Vomiting and Other (See Comments)    SEVERE NAUSEA    Current Outpatient Medications on File Prior to Visit  Medication Sig Dispense Refill  . rosuvastatin  (CRESTOR ) 5 MG tablet Take 10 mg by mouth once daily    . metFORMIN (  GLUCOPHAGE) 500 MG tablet      No current facility-administered medications on file prior to visit.    Family History  Problem Relation Age of Onset  . Skin cancer Mother   . Obesity Father   . High blood pressure (Hypertension) Father   . Hyperlipidemia (Elevated cholesterol) Father   . Colon cancer Sister      Social History   Tobacco Use  Smoking Status Former  Smokeless Tobacco Never     Social History   Socioeconomic History  . Marital status: Married  Tobacco Use  . Smoking status: Former  . Smokeless tobacco: Never  Vaping Use  . Vaping status: Never Used  Substance and Sexual Activity  . Alcohol  use: Yes  . Drug use: Not Currently   Social Drivers of Health   Food Insecurity: No Food Insecurity (07/02/2024)   Received [facility]   Hunger Vital Sign   . Within the past 12 months, you worried that your food would run out before you got the money to buy more.: Never true   . Within  the past 12 months, the food you bought just didn't last and you didn't have money to get more.: Never true  Transportation Needs: No Transportation Needs (07/02/2024)   Received from [facility]   . In the past 12 months, has lack of transportation kept you from medical appointments or from getting medications?: No   . In the past 12 months, has lack of transportation kept you from meetings, work, or from getting things needed for daily living?: No  Social Connections: Socially Integrated (07/02/2024)   Received from [facility]   Social Connection and Isolation Panel   . In a typical week, how many times do you talk on the phone with family, friends, or neighbors?: More than three times a week   . How often do you get together with friends or relatives?: Three times a week   . How often do you attend church or religious services?: More than 4 times per year   . Do you belong to any clubs or organizations such as church groups, unions, fraternal or athletic groups, or school groups?: Yes   . How often do you attend meetings of the clubs or organizations you belong to?: 1 to 4 times per year   . Are you married, widowed, divorced, separated, never married, or living with a partner?: Married    ############################################################  [place] Risk:   Preoperative Risks/Screening 1. Frailty Review:   Lives independently? yes  Uses a mobility assist device (cane/walker/wheelchair)? no  History of falls within 3 months? no  Cognitive impairment/dementia? no  Age > 65? yes  2. Nutrition Screening:  Cancer/IBD? yes 2018- NED 2022  Age >65?  yes  Weight loss >10% in past 6 months? no  If yes to any of the 3 above, consider Impact AR supplemental shake  3. PONV Screening:  History of PONV? no  Male under age of 27? no  History of motion sickness? no  4. Chronic pain issues? no  5. Diabetes? no Last HgbA1c: No results found for:  HGBA1C    Review of Systems: A complete review of systems (ROS) was obtained from the patient.  I have reviewed this information and discussed as appropriate with the patient.  See HPI as well for other pertinent ROS.  Constitutional:  No fevers, chills, sweats.  Weight stable Eyes:  No vision changes, No discharge HENT:  No sore throats, nasal drainage Lymph: No neck swelling, No bruising easily Pulmonary:  No cough, productive sputum CV: No orthopnea, PND  Patient walks 60 minutes for about 2 miles without difficulty.  No exertional chest/neck/shoulder/arm pain.  GI:  No personal nor family history of GI/colon cancer, inflammatory bowel disease, irritable bowel syndrome, allergy such as Celiac Sprue, dietary/dairy problems, colitis, ulcers nor gastritis.  No recent sick contacts/gastroenteritis.  No travel outside the country.  No changes in diet.  Renal: No UTIs, No hematuria.  Bladder cancer status post cystectomy and ileal conduit 2018. Genital:  No drainage, bleeding, masses Musculoskeletal: No severe joint pain.  Good ROM major joints Skin:  No sores or lesions Heme/Lymph:  No easy bleeding.  No swollen lymph nodes  Objective:   Vitals:   09/07/24 1346  Weight: 78.9 kg (174 lb)  Height: 177.8 cm (5' 10)  PainSc: 0-No pain     Body mass index is 24.97 kg/m.  Weight  246 pounds April 2025  174 pounds September 07, 2024  PHYSICAL EXAM:   Constitutional: Thin with some cachexia.  However much more bright and alert.  This is the most chatty and conversational I have ever had him be even preoperatively.  Vitals signs as above.   Eyes: Pupils reactive, normal extraocular movements. Sclera nonicteric Neuro: CN II-XII intact.  No major focal sensory defects.  No major motor deficits. Lymph: No head/neck/groin lymphadenopathy Psych:  No severe agitation.  No severe anxiety.  Judgment & insight Adequate, Oriented x4, HENT: Normocephalic, Mucus membranes moist.  No thrush.    Neck: Supple, No tracheal deviation.  No obvious thyromegaly Chest: No pain to chest wall compression.  Good respiratory excursion.  No audible wheezing CV:  Pulses intact.   Regular rhythm.  No major extremity edema  Abdomen: Midline periumbilical wound 45 x 25 mm and very superficial with excellent granulation.  Stop the gauze and just put dry ABD.  Panniculus is shrunk down a lot.  Not nearly morbidly obese.  Right flank panniculus soft with 1 cm pocket of granulation tissue.  Scant serous drainage expressed.  Old serous staining on gauze.  I could probe 25 mm deep.  This is the first time he felt some discomfort and stinging with cauterizing.  Nothing deeper to pack.  I packed it with a corner of 4 x 4 gauze.  Right upper quadrant end ileostomy with thin stool in bag Right lower quadrant urostomy with urine in bag and stent. Left posterior back nephrostomy tube with dry dressing and clear urine in bag. Right old nephrostomy tube site on the right posterior back closed completely dry.  Gen:  Inguinal hernia: Not present.  Inguinal lymph nodes: without lymphadenopathy.    Rectal: (Deferred)  Ext: No obvious deformity or contracture.  Edema: Not present.  No cyanosis Skin: No major subcutaneous nodules.  Warm and dry Musculoskeletal: Severe joint rigidity not present.  No obvious clubbing.  No digital petechiae.      Labs, Imaging and Diagnostic Testing:  Located in 'Care Everywhere' section of Epic EMR chart  PRIOR NOTES    Located in 'Care Everywhere' section of Epic EMR chart  SURGERY NOTES:  Located in 'Care Everywhere' section of Epic EMR chart  PATHOLOGY:  Located in 'Care Everywhere' section of Epic EMR chart   Assessment and Plan:  DIAGNOSES:  Diagnoses and all orders for this visit:  Parastomal hernia of ileal conduit  Incisional hernia, without obstruction or gangrene  Status post ileal  conduit (CMS/HHS-HCC)       ASSESSMENT/PLAN  Slowly recovering  status post robotic lyse adhesions with TAR release and repairs of right lower quadrant parastomal hernia around ileal conduit, midline periumbilical incisional hernia, left lower quadrant old colostomy incisional hernia.  May 2025.  Complicated by postoperative bleeding, hematoma, abscess, delayed leak, exploratory laparotomy with resection, end ileostomy with abdominal reconstruction with Phasix mesh, delayed ileal conduit urine leak controlled with internal/external drainage and stenting, prolonged rehab.  He finally got a rehab 08/14/2028 for the second round.  He is lost tons of weight and is deconditioned but he is bright and alert.  His nutrition numbers are finally better.  I think the midline wound can just be switched to an ABD pad only in the hopes that it will scab over and close.  The right flank wound at the drain site has opened up a little bit.  I can probe it it is more superficial.  It is serous and not purulent anymore.  I packed this with a quarter 4 x 4 gauze.  Would like to see him in 2 weeks to see if this is dried up as it down before.  If not, may require local and incision and drainage and packing here in the office next time around.  Nutrition and exercise.  Agree he does not need supplemental potassium magnesium  anymore.  Provide and fecal ileostomy in right upper quadrant.  Do fiber.  Imodium  3 times daily.  See if he can wean down.  Related to ileal conduit leak.  Kristopher Thompson slowly removing drains and tubes since he bounced back with problems capping them.  Right kidney system is out.  Only left side remains.  See if he can recapped the left kidney tube and eventually remove the stent.  May need to stay forever.  Kristopher Thompson following and will defer to him.  Could consider end ileostomy down with the robotic intracorporeal anastomosis.  Very guarded against that given his horrible adhesions and very deconditioned state with numerous delayed weeks and problems despite  trying to do repair and under more ideal conditions..  If we get into 2027 and he is much back to normal up alert and active with normal nutrition and activity level, can reconsider.   FOLLOWUP:  Return in about 2 weeks (around 09/21/2024) for Return to clinic to ensure wound(s) are healing.  I had direct face-to-face contact with the patient for a total of 45 minutes and greater than 50% of that time was spent providing counseling and/or coordination of care for the patient regarding the above.     ########################################################   Kristopher KYM Schultze, MD, FACS, MASCRS Esophageal, Gastrointestinal & Colorectal Surgery Robotic and Minimally Invasive Surgery  [facility]  [facility]  [address] [place], [place] [address] [phone] Fax [phone] Main               Note: Portions of this report may have been transcribed using voice recognition software. Every effort was made to ensure accuracy; however, inadvertent computerized transcription errors may be present.   Any transcriptional errors that result from this process are unintentional.

## 2024-09-08 ENCOUNTER — Ambulatory Visit: Admitting: Internal Medicine

## 2024-09-08 ENCOUNTER — Other Ambulatory Visit: Payer: Self-pay

## 2024-09-08 ENCOUNTER — Encounter: Payer: Self-pay | Admitting: Internal Medicine

## 2024-09-08 VITALS — BP 101/66 | HR 121 | Wt 177.0 lb

## 2024-09-08 DIAGNOSIS — I4510 Unspecified right bundle-branch block: Secondary | ICD-10-CM | POA: Diagnosis not present

## 2024-09-08 DIAGNOSIS — E119 Type 2 diabetes mellitus without complications: Secondary | ICD-10-CM

## 2024-09-08 DIAGNOSIS — B9620 Unspecified Escherichia coli [E. coli] as the cause of diseases classified elsewhere: Secondary | ICD-10-CM | POA: Diagnosis not present

## 2024-09-08 DIAGNOSIS — B9689 Other specified bacterial agents as the cause of diseases classified elsewhere: Secondary | ICD-10-CM | POA: Diagnosis not present

## 2024-09-08 DIAGNOSIS — L02211 Cutaneous abscess of abdominal wall: Secondary | ICD-10-CM | POA: Diagnosis present

## 2024-09-08 DIAGNOSIS — I2510 Atherosclerotic heart disease of native coronary artery without angina pectoris: Secondary | ICD-10-CM

## 2024-09-08 DIAGNOSIS — I110 Hypertensive heart disease with heart failure: Secondary | ICD-10-CM

## 2024-09-08 DIAGNOSIS — Z1624 Resistance to multiple antibiotics: Secondary | ICD-10-CM

## 2024-09-08 DIAGNOSIS — I509 Heart failure, unspecified: Secondary | ICD-10-CM

## 2024-09-08 NOTE — Progress Notes (Signed)
 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 05/11/2024   Delayed bowel perforation s/p SBR/end ileostomy 04/29/2024   Pressure injury of skin 04/29/2024   Sinus tachycardia 04/13/2024   Tachypnea 04/13/2024   Acute respiratory insufficiency, postoperative 04/13/2024   Sepsis due to undetermined organism (HCC) 04/13/2024   Lactic acidosis 04/13/2024   Class 2 obesity 04/13/2024   Incarcerated incisional hernia 04/10/2024   Chronic anticoagulation 03/02/2024   Personal history of PE (pulmonary embolism) 03/02/2024   Irritant contact dermatitis associated with fecal stoma 05/28/2023   Non-recurrent bilateral inguinal hernia without obstruction or gangrene 05/28/2023   Obstructive sleep apnea of adult 11/21/2022   Post-traumatic arthritis of ankle, 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   Hearing loss 07/24/2021   History of bladder cancer 07/24/2021   Prolonged QT interval 12/07/2018   Bilateral hydronephrosis 12/07/2018   AKI (acute kidney injury) 06/15/2018   Fever 10/09/2017   Sepsis (HCC) 10/04/2017   GERD (gastroesophageal reflux disease) 08/11/2017   Obesity (BMI 35.0-39.9 without comorbidity) 08/11/2017   S/P ileal conduit (HCC) 08/08/2017   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 08/08/2017 08/08/2017      Subjective:    Patient ID: Kristopher Thompson, male    DOB: Apr 18, 1951, 73 y.o.   MRN: [id number]  No  chief complaint on file.   HPI:  Kristopher Thompson is a 73 y.o. male here for hospital f/u abd wall abscess   09/08/24 id clinic f/u Patient has a complicated hospital/prehospital course and extensive surgical history ---- see a&p for time course and events  Patient discharged from inpatient rehab 08/14/24 Overall weak still and in wheel chair but slowly feeling better The right flank opening still there and draining but no purulence Surgery f/u done 10/27 -- wound packed Nephrostomy tubes left side exchanged, right sided removed a week prior to this visit  No complaint  Has hh nurse that comes and help with packing of wound  Midline abd wound remains and no issue   Allergies  Allergen Reactions   Demerol  [Meperidine ] Nausea And Vomiting      Outpatient Medications Prior to Visit  Medication Sig Dispense Refill   acetaminophen  (TYLENOL ) 325 MG tablet Take 1-2 tablets (325-650 mg total) by mouth every 4 (four) hours as needed for mild pain (pain score 1-3).     cyanocobalamin  1000 MCG tablet Take 0.5 tablets (500 mcg total) by mouth every Monday, Wednesday, and Friday. 30 tablet 0   ELIQUIS  5 MG TABS tablet Take 1 tablet (5 mg total) by mouth 2 (two) times daily. 60 tablet 0   feeding supplement (ENSURE PLUS HIGH PROTEIN) LIQD Take 237 mLs by mouth 2 (two) times daily between meals. (Patient taking differently: Take 237 mLs by mouth daily.)     hydrOXYzine  (ATARAX ) 25 MG tablet Take 1 tablet (25 mg total) by mouth 3 (three) times daily as needed for anxiety. 30 tablet 0   ipratropium (ATROVENT ) 0.03 % nasal  spray Place 1 spray into both nostrils every 8 (eight) hours as needed (allergies). (Patient not taking: Reported on 07/02/2024)     leptospermum manuka honey (MEDIHONEY) PSTE paste Apply 1 Application topically daily. 44 mL 0   loperamide  (IMODIUM ) 2 MG capsule Take 2 capsules (4 mg total) by mouth 3 (three) times daily. 180 capsule 0   magnesium  gluconate (MAGONATE) 500 (27  Mg) MG TABS tablet Take 1 tablet (500 mg total) by mouth 2 (two) times daily with a meal. 60 tablet 0   melatonin 3 MG TABS tablet Take 1 tablet (3 mg total) by mouth at bedtime. 30 tablet 0   midodrine  (PROAMATINE ) 5 MG tablet Take 2 tablets (10 mg total) by mouth 3 (three) times daily with meals. 180 tablet 0   Nystatin (GERHARDT'S BUTT CREAM) CREA Apply 1 Application topically 2 (two) times daily. (Patient not taking: Reported on 07/02/2024) 60 each 0   Omega-3 Fatty Acids (FISH OIL) 1000 MG CAPS Take 1,000 mg by mouth every Monday, Wednesday, and Friday. (Patient not taking: Reported on 07/02/2024)     Pediatric Multiple Vitamins (CHILDRENS MULTIVITAMIN) chewable tablet Chew 1 tablet by mouth 2 (two) times daily.     polycarbophil (FIBERCON) 625 MG tablet Take 1 tablet (625 mg total) by mouth 2 (two) times daily. 60 tablet 0   potassium chloride  (KLOR-CON  M) 10 MEQ tablet Take 1 tablet (10 mEq total) by mouth daily. 30 tablet 0   rosuvastatin  (CRESTOR ) 10 MG tablet Take 1 tablet (10 mg total) by mouth every evening. 30 tablet 0   sodium chloride  (OCEAN) 0.65 % SOLN nasal spray Place 1-2 sprays into both nostrils every 6 (six) hours as needed for congestion (dry nose).     No facility-administered medications prior to visit.     Social History   Socioeconomic History   Marital status: Married    Spouse name: Not on file   Number of children: Not on file   Years of education: Not on file   Highest education level: Not on file  Occupational History   Not on file  Tobacco Use   Smoking status: Former    Current packs/day: 0.00    Types: Cigarettes    Start date: 12/24/1970    Quit date: 12/25/1979    Years since quitting: 44.7   Smokeless tobacco: Never  Vaping Use   Vaping status: Never Used  Substance and Sexual Activity   Alcohol  use: Yes    Comment: rare   Drug use: No   Sexual activity: Yes  Other Topics Concern   Not on file  Social History Narrative   Not on file    Social Drivers of Health   Financial Resource Strain: Not on file  Food Insecurity: No Food Insecurity (07/02/2024)   Hunger Vital Sign    Worried About Running Out of Food in the Last Year: Never true    Ran Out of Food in the Last Year: Never true  Transportation Needs: No Transportation Needs (07/02/2024)   PRAPARE - Administrator, Civil Service (Medical): No    Lack of Transportation (Non-Medical): No  Physical Activity: Not on file  Stress: Not on file  Social Connections: Socially Integrated (07/02/2024)   Social Connection and Isolation Panel    Frequency of Communication with Friends and Family: More than three times a week    Frequency of Social Gatherings with Friends and Family: Three times a week    Attends Religious Services: More  than 4 times per year    Active Member of Clubs or Organizations: Yes    Attends Banker Meetings: 1 to 4 times per year    Marital Status: Married  Catering Manager Violence: Not At Risk (07/02/2024)   Humiliation, Afraid, Rape, and Kick questionnaire    Fear of Current or Ex-Partner: No    Emotionally Abused: No    Physically Abused: No    Sexually Abused: No      Review of Systems    All other ros negative  Objective:    Pulse (!) 122   Wt 177 lb (80.3 kg)   SpO2 99%   BMI 24.69 kg/m  Nursing note and vital signs reviewed.  Physical Exam     General/constitutional: no distress, pleasant, in wheel chair HEENT: Normocephalic, PER, Conj Clear, EOMI, Oropharynx clear Neck supple CV: rrr no mrg Lungs: clear to auscultation, normal respiratory effort Abd: Soft Right flank wound dressing serous stain Midline wound dressing c/d Right sided ileostomy functioning     Gu: left flank nephrostomy tube functioning  Ext: no edema Skin: No Rash Neuro: bilateral LE strength symmetric 4-5/5  MSK: no peripheral joint swelling/tenderness/warmth; back spines nontender   Central line presence:  no   Labs: Lab Results  Component Value Date   WBC 7.8 08/13/2024   HGB 8.7 (L) 08/13/2024   HCT 27.4 (L) 08/13/2024   MCV 83.8 08/13/2024   PLT 364 08/13/2024   Last metabolic panel Lab Results  Component Value Date   GLUCOSE 86 08/13/2024   NA 138 08/13/2024   K 4.1 08/13/2024   CL 112 (H) 08/13/2024   CO2 17 (L) 08/13/2024   BUN 23 08/13/2024   CREATININE 1.35 (H) 08/13/2024   GFRNONAA 56 (L) 08/13/2024   CALCIUM  9.1 08/13/2024   PHOS 5.0 (H) 08/13/2024   PROT 5.5 (L) 08/13/2024   ALBUMIN  2.4 (L) 08/13/2024   BILITOT <0.2 08/13/2024   ALKPHOS 77 08/13/2024   AST 21 08/13/2024   ALT 28 08/13/2024   ANIONGAP 9 08/13/2024        Component Value Date/Time   CRP 1.6 (H) 08/13/2024 0411   CRP 1.5 (H) 08/12/2024 0407   CRP 0.8 08/09/2024 0429   Lab Results  Component Value Date   ESRSEDRATE 55 (H) 08/10/2024    Micro:  Serology:  Imaging:  Assessment & Plan:   Problem List Items Addressed This Visit   None Visit Diagnoses       Abdominal wall abscess    -  Primary         No orders of the defined types were placed in this encounter.    Assessment: 73 year old male with abdominal wall/mesh infection: #Abdominal wall abscess from prior perforation status post multiple procedures #JP drain for abdominal wall cultures growing MDRO ecoli and corynebacterium species #hx bladder cancer s/p cystectomy and ileal conduit distant past  #other chronic medical problems Rbbb Htn Dm2 Cad Chf   Procedures: 04/10/24 parastomal and incisional incarcerated abd wall hernia --> LOA; parastomal hernia repair with mesh; urostomy ileal conduit revision 6/09 perforated bowel --> drainage of right abd wall abscess and intraabd abscess; explantation of mesh; small bowel resection in continuity; placement of wound vac 6/10 delayed bowel perforation with open abd --> LOA; abd wall I&D; ileal resection; end ileostomy; abd wall parastomal/incisional hernia repair  with phasix mesh (dissolvable in 1 year); fascia closure and wound vac placement 6/23 bilateral nephrostomy tube placement 6/30 bilateral nephrostomy tube exchange  7/02 right ureteral stent placement    Patient had during the extensive surgical procedures above hydronephrosis and that required bilateral nephrostomy tube insertion. There was at one point ileal conduit leak into the abd wound but that appeared repaired     Current Admission Cx: 8/20 blood cx negative 8/21 Jp drain from the abd wall cx - Ecoli (R amp, cefazolin , cipro , bactrim ; I amp/sulb; S ceftriaxone , piptazo), Corynebacterium species   8/21 urine cx mrsa (S tetra; R bactrim ), e faecium (V sensitive)     -------------- 08/05/24 id assessment Patient had aki with vancomycin , which was stopped on 9/5, transitioned to linezolid  but will need long term so stopped and given a dose of oritavancin  on 9/19 He is also on ceftriaxone  now   Long term plan has been periodic dalbavancin or oritavancin  for the corynebacterium, and oral omadocycline for the ecoli   Duration -- I agreed with dr Sheldon I don't know how long to treat him for; according to dr Sheldon, it takes about a year for the mesh to dissolve. Undertreatment with recurrent infection often comes with worse outcome in terms of resistance, and surgery would be a night mare. I extrapolate my duration recommendation from prosthetic joint infection which obviously is not quite the same   We could aim for 6 months of treatment. The issue now is the expense and the wife expresses that she wants to see how patient respond to any antibiotics that he'll need to take to make sure no gi side effect that could develop when they are home     --------------- 08/06/24 id assessment Patient no complaint Dr Sheldon from surgery sent me a epic inbox message saying he doesn't believe there is a mesh infection. He desribed abd wall abscess with a sinus tract to the skin.    In this setting if  not mesh infection present, I would recommend tx focused on the abd wall abscess (which the drain was removed about 3 weeks ago (ecoli/coryne on drain cx). Now the area is still draining and 9/19 ct scan showed the same fluid collection.  Patient has been on midodrine /florinef  for borderline bp for several weeks but no sepsis otherwise.    I discussed with primary team that I am not sure what to make of the discharge. If it is still infected I would say then that some other drain placement or minor I&D could help patient (in setting multiple MDR organisms present already and we need source control). But if no longer infected and just a seroma/hematoma then we should observe off antibiotics and reevaluate in 1-2 weeks for microbial process     With regard to primary prophylaxis for the mesh infection, I do not believe the benefit outweighs risk and given presence of MDR organisms already, I think it is more risky for the patient,       This was relayed to dr Sheldon       Plan: -stop abx -monitor serial crp every 3 days -repeat ct in 1-2 weeks of the abdomen and evaluate to see if fluid collection gotten bigger or concern for ongoing abd wall abscess; will discuss with surgery/ir again if that's the case -maintain contact isolation precaution -discussed with primary team   -------------- 09/08/24 id clinic assessment Reviewed chart Patient was discharged 08/14/24 from inpatient re[facility] He had repeat ct abd pelv 10/02 which showed no significant change of abd wall fluid collection Saw surgery clinic 10/27 outpatient follow up -- right flank wound  at the drain site opened up a bit appeared superficial on probe, serous discharge not purulent 08/31/24 has with IR left nephrostomy tube exchange and right nephrostomy tube removal and right nephroureteral catheter exchange   No labs today The right flank dressing examined appears serous stain not purulent So far surgical team doesn't  suspect ongoing infection, but again potential local I&D of the right flank again   F/u 6 weeks or sooner if fever, chill   Follow-up: Return in about 6 weeks (around 10/20/2024).      Constance ONEIDA Passer, MD [facility] 09/08/2024, 2:04 PM

## 2024-09-08 NOTE — Patient Instructions (Signed)
 Will continue to monitor you 6 weeks from now   So far the surgical team doesn't appear to suspect ongoing infection of the abdominal wall or the mesh   As you are feeling ok and better since admission without obvious sign of infection will continue to stay off antibiotics

## 2024-09-16 ENCOUNTER — Encounter: Attending: Physical Medicine and Rehabilitation | Admitting: Physical Medicine and Rehabilitation

## 2024-09-16 ENCOUNTER — Encounter: Payer: Self-pay | Admitting: Physical Medicine and Rehabilitation

## 2024-09-16 VITALS — BP 102/62 | HR 97 | Ht 71.0 in | Wt 177.0 lb

## 2024-09-16 DIAGNOSIS — R112 Nausea with vomiting, unspecified: Secondary | ICD-10-CM | POA: Insufficient documentation

## 2024-09-16 DIAGNOSIS — R630 Anorexia: Secondary | ICD-10-CM | POA: Diagnosis not present

## 2024-09-16 DIAGNOSIS — I951 Orthostatic hypotension: Secondary | ICD-10-CM | POA: Diagnosis present

## 2024-09-16 DIAGNOSIS — L89101 Pressure ulcer of unspecified part of back, stage 1: Secondary | ICD-10-CM | POA: Insufficient documentation

## 2024-09-16 MED ORDER — SERTRALINE HCL 50 MG PO TABS: ORAL_TABLET | ORAL | 0 refills | Status: AC

## 2024-09-16 MED ORDER — ONDANSETRON 4 MG PO TBDP: 4.0000 mg | ORAL_TABLET | Freq: Every day | ORAL | 2 refills | Status: DC | PRN

## 2024-09-16 NOTE — Progress Notes (Signed)
 Subjective:    Patient ID: Kristopher Thompson, male    DOB: 27-Apr-1951, 73 y.o.   MRN: [id number]  HPI   Kristopher Thompson is a 73 y.o. year old male  who  has a past medical history of At risk for sleep apnea, Atypical nevus (05/25/2005), Atypical nevus (04/04/2007), Atypical nevus (04/04/2007), Atypical nevus (04/04/2007), Atypical nevus (11/29/2011), Atypical nevus (11/29/2011), Bacteremia due to Klebsiella pneumoniae (10/09/2017), Bladder cancer (HCC) (dx 07/2017), Candida infection, CHF (congestive heart failure) (HCC), Colostomy in place [facility]), Diabetes mellitus without complication (HCC), GERD (gastroesophageal reflux disease), H/O hiatal hernia, History of sepsis (09/2017), Prostate cancer [facility]) (urologist-- dr renda), RBBB, Renal disorder, Sleep apnea, Squamous cell carcinoma of skin (05/22/2013), and Wears glasses.   They are presenting to PM&R clinic for follow up related to [facility] s/p IPR stay 8/30-10/3 .   Interval Hx:  Discussed the use of AI scribe software for clinical note transcription with the patient, who gave verbal consent to proceed.  History of Present Illness   Kristopher Thompson is a 73 year old male who presents for follow-up after recent hospitalization.  He experiences significant fatigue after walking twice around his house and uses a standard walker for mobility due to the speed of a rolling walker. Shortness of breath occurs with exertion but not at rest.  Orthostatic symptoms include lightheadedness and dizziness upon standing, which improve after a minute. Home blood pressure readings are around 110/64, 108/56, and 110/68.  Peripheral neuropathy presents as numbness in his toes without burning pain or neuropathy in his hands. He has difficulty dressing himself but can feed himself without issues.  He has been off potassium and magnesium  supplements since his hospital discharge and takes medications mixed with nutritional supplements for easier consumption.           Pain Inventory Average Pain 0 Pain Right Now 0 My pain is dull  In the last 24 hours, has pain interfered with the following? General activity 0 Relation with others 0 Enjoyment of life 0 What TIME of day is your pain at its worst? night Sleep (in general) Fair  Pain is worse with: unsure Pain improves with: na Relief from Meds: 6  use a walker how many minutes can you walk? 4-5 ability to climb steps?  no do you drive?  no use a wheelchair needs help with transfers  retired I need assistance with the following:  dressing, bathing, toileting, meal prep, household duties, and shopping  bladder control problems bowel control problems trouble walking dizziness  Any changes since last visit?  yes has een all this MDs and has HH therpay and SN seeing him  Any changes since last visit?  no    Family History  Problem Relation Age of Onset   Lung cancer Mother    Hypertension Father    Colon cancer Other    CAD Neg Hx    Diabetes Neg Hx    Stroke Neg Hx    Social History   Socioeconomic History   Marital status: Married    Spouse name: Not on file   Number of children: Not on file   Years of education: Not on file   Highest education level: Not on file  Occupational History   Not on file  Tobacco Use   Smoking status: Former    Current packs/day: 0.00    Types: Cigarettes    Start date: 12/24/1970    Quit date: 12/25/1979    Years since  quitting: 44.7   Smokeless tobacco: Never  Vaping Use   Vaping status: Never Used  Substance and Sexual Activity   Alcohol  use: Yes    Comment: rare   Drug use: No   Sexual activity: Yes  Other Topics Concern   Not on file  Social History Narrative   Not on file   Social Drivers of Health   Financial Resource Strain: Not on file  Food Insecurity: No Food Insecurity (07/02/2024)   Hunger Vital Sign    Worried About Running Out of Food in the Last Year: Never true    Ran Out of Food in the Last Year: Never  true  Transportation Needs: No Transportation Needs (07/02/2024)   PRAPARE - Administrator, Civil Service (Medical): No    Lack of Transportation (Non-Medical): No  Physical Activity: Not on file  Stress: Not on file  Social Connections: Socially Integrated (07/02/2024)   Social Connection and Isolation Panel    Frequency of Communication with Friends and Family: More than three times a week    Frequency of Social Gatherings with Friends and Family: Three times a week    Attends Religious Services: More than 4 times per year    Active Member of Clubs or Organizations: Yes    Attends Banker Meetings: 1 to 4 times per year    Marital Status: Married   Past Surgical History:  Procedure Laterality Date   ABDOMINAL SURGERY     APPENDECTOMY  1972   BOWEL RESECTION N/A 04/20/2024   Procedure: SMALL BOWEL RESECTION;  Surgeon: [name], MD;  Location: [facility];  Service: General;  Laterality: N/A;   CHOLECYSTECTOMY  1985   COLONOSCOPY N/A 06/29/2021   Procedure: COLONOSCOPY;  Surgeon: Debby Hila, MD;  Location: [facility];  Service: Endoscopy;  Laterality: N/A;   COLOSTOMY REVERSAL N/A 01/08/2018   Procedure: COLOSTOMY REVERSAL;  Surgeon: Debby Hila, MD;  Location: [facility];  Service: General;  Laterality: N/A;   CYSTOSCOPY WITH RETROGRADE PYELOGRAM, URETEROSCOPY AND STENT PLACEMENT Right 06/10/2017   Procedure: CYSTOSCOPY WITH RIGHT URETEROSCOPY WITH RIGHT STENT PLACEMENT;  Surgeon: [name], MD;  Location: [facility];  Service: Urology;  Laterality: Right;   EUS N/A 04/16/2018   Procedure: FULL UPPER ENDOSCOPIC ULTRASOUND (EUS) RADIAL;  Surgeon: Burnette Fallow, MD;  [facility];  Service: Endoscopy;  Laterality: N/A;   FLEXIBLE SIGMOIDOSCOPY N/A 12/13/2017   Procedure: FLEXIBLE SIGMOIDOSCOPY;  Surgeon: Debby Hila, MD;  [facility];  Service: Endoscopy;  Laterality: N/A;   ILEO CONDUIT     IR CATHETER TUBE CHANGE  12/13/2017   IR CATHETER  TUBE CHANGE  01/17/2018   IR CATHETER TUBE CHANGE  02/28/2018   IR CATHETER TUBE CHANGE  07/18/2018   IR CATHETER TUBE CHANGE  08/22/2018   IR CATHETER TUBE CHANGE  10/31/2018   IR CONVERT LEFT NEPHROSTOMY TO NEPHROURETERAL CATH  10/24/2017   IR EXT NEPHROURETERAL CATH EXCHANGE  05/19/2018   IR EXT NEPHROURETERAL CATH EXCHANGE  06/16/2018   IR EXT NEPHROURETERAL CATH EXCHANGE  07/09/2024   IR NEPHRO TUBE REMOV/FL  10/24/2017   IR NEPHRO TUBE REMOV/FL  08/31/2024   IR NEPHROSTOGRAM LEFT THRU EXISTING ACCESS  12/05/2018   IR NEPHROSTOGRAM RIGHT THRU EXISTING ACCESS  08/31/2024   IR NEPHROSTOMY EXCHANGE LEFT  05/11/2024   IR NEPHROSTOMY EXCHANGE LEFT  07/09/2024   IR NEPHROSTOMY EXCHANGE LEFT  08/31/2024   IR NEPHROSTOMY PLACEMENT LEFT  10/07/2017   IR NEPHROSTOMY PLACEMENT  RIGHT  05/04/2024   IR NEPHROSTOMY TUBE CHANGE  04/11/2018   IR URETERAL STENT PLACEMENT EXISTING ACCESS LEFT  05/13/2024   IR URETERAL STENT RIGHT NEW ACCESS W/SEP NEPHROSTOMY CATH  05/13/2024   LAPAROTOMY N/A 04/20/2024   Procedure: EXPLORATORY LAPAROTOMY;  Surgeon: [name], MD;  Location: [facility];  Service: General;  Laterality: N/A;   PARTIAL COLECTOMY N/A 04/21/2024   Procedure: COLECTOMY, PARTIAL;  Surgeon: [name], MD;  Location: [facility];  Service: General;  Laterality: N/A;  Removal Wound Vac, Washout Ostomy, Possible Anastomosis, Possible Ileostomy.  Phasix Mesh.   POLYPECTOMY  06/29/2021   Procedure: POLYPECTOMY;  Surgeon: Debby Hila, MD;  [facility];  Service: Endoscopy;;   ROBOT ASSISTED LAPAROSCOPIC RADICAL PROSTATECTOMY  10/02/2012   Procedure: ROBOTIC ASSISTED LAPAROSCOPIC RADICAL PROSTATECTOMY LEVEL 2;  Surgeon: Noretta Ferrara, MD;  Location: [facility];  Service: Urology;  Laterality: N/A;   ROBOTIC ASSISTED LAPAROSCOPIC BLADDER DIVERTICULECTOMY N/A 08/08/2017   Procedure: XI ROBOTIC ASSISTED LAPAROSCOPIC RADICAL CYSTECTOMY COVERTED TO OPEN PELVIC LYMPHADNECTOMY BILATERAL AND ILEAL CONDUIT URINARY DIVERSION;   Surgeon: Ferrara Glance, MD;  Location: [facility];  Service: Urology;  Laterality: N/A;   TRANSURETHRAL RESECTION OF BLADDER TUMOR  06/10/2017   Procedure: TRANSURETHRAL RESECTION OF BLADDER TUMOR (TURBT);  Surgeon: Ferrara Glance, MD;  Location[facility];  Service: Urology;;   VACUUM ASSISTED CLOSURE CHANGE N/A 04/20/2024   Procedure: PLACEMENT OF CONCETTA CAPES;  Surgeon: [name], MD;  Location: [facility];  Service: General;  Laterality: N/A;   VENTRAL HERNIA REPAIR N/A 04/10/2024   Procedure: REPAIR, HERNIA, VENTRAL;  Surgeon: [name], MD;  Location[facility];  Service: General;  Laterality: N/A;   XI ROBOT ABDOMINAL PERINEAL RESECTION N/A 08/08/2017   Procedure: REPAIR OF RECTAL TEAR POSSIBLE PARTIAL PROCTECTOMY, CREATION OF  OSTOMY;  Surgeon: Debby Hila, MD;  Location[facility];  Service: General;  Laterality: N/A;   XI ROBOTIC ASSISTED PARASTOMAL HERNIA REPAIR N/A 04/10/2024   Procedure: REPAIR, HERNIA, PARASTOMAL AND VENTRAL HERNIAS, ROBOT-ASSISTED, LEFT INGUINAL HERNIA, LYSIS OF ADHESIONS INCARCERATED AND INSIONAL HERNIAS AND UROSTOMY REVISION;  Surgeon: [name], MD;  Location: [facility];  Service: General;  Laterality: N/A;   Past Medical History:  Diagnosis Date   At risk for sleep apnea    12-25-2017   STOP-BANG SCORE= 5   --- SENT TO PCP   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   moderate atypia - right low back   Atypical nevus 04/04/2007   moderate to marked - right upper back (wider shave)   Atypical nevus 04/04/2007   moderate atypia - center chest (wider shave)   Atypical nevus 04/04/2007   slight atypia - right thigh   Atypical nevus 11/29/2011   mild atypia - center upper back   Atypical nevus 11/29/2011   mild atypia - center chest   Bacteremia due to Klebsiella pneumoniae 10/09/2017   Bladder cancer (HCC) dx 07/2017   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  s/p  cystectomy w/ ileal conduit urinary diversion   Candida infection    CHF (congestive heart failure) (HCC)     Colostomy in place [facility])    since 08-08-2017-- per pt 12-25-2017 reddness around stoma   Diabetes mellitus without complication (HCC)    GERD (gastroesophageal reflux disease)    H/O hiatal hernia    History of sepsis 09/2017   dx bacteremia due to klebsiella pneumoniae,  post op intraabdominal abscess   Prostate cancer [facility]) urologist-- dr ferrara   10-02-2012 s/p  prostatectomy-- Stage  T1c   RBBB    Renal disorder    pt. denies   Sleep apnea    cpap   Squamous cell carcinoma of skin 05/22/2013   left cheek - CX3 + 5FU   Wears glasses    BP 102/62   Pulse 97   Ht 5' 11 (1.803 m)   Wt 177 lb (80.3 kg) Comment: last recorded  SpO2 98%   BMI 24.69 kg/m   Opioid Risk Score:   Fall Risk Score:  `1  Depression screen PHQ 2/9     09/16/2024    2:05 PM 09/08/2024    2:06 PM  Depression screen PHQ 2/9  Decreased Interest 3 0  Down, Depressed, Hopeless 0 0  PHQ - 2 Score 3 0  Altered sleeping 0   Tired, decreased energy 1   Change in appetite 2   Feeling bad or failure about yourself  0   Trouble concentrating 0   Moving slowly or fidgety/restless 0   Suicidal thoughts 0   PHQ-9 Score 6   Difficult doing work/chores Not difficult at all      Review of Systems  Constitutional:  Positive for appetite change.       Poor appetite  Respiratory:  Positive for shortness of breath.   Gastrointestinal:  Positive for diarrhea and nausea.  Genitourinary:        Has urine bag from kidney and some redness around site  Musculoskeletal:  Positive for gait problem.  Neurological:  Positive for dizziness and weakness.  Hematological:  Bruises/bleeds easily.  Psychiatric/Behavioral:  Positive for dysphoric mood.   All other systems reviewed and are negative.      Objective:   Physical Exam   Constitutional: No acute distress. Sitting up in WC. +sunken cheeks, pretibial atrophy.  HEENT: NCAT, EOMI, oral membranes moist.    Neck: supple, no JVD. Cardiovascular: RRR without  murmur.  No peripheral edema Respiratory/Chest: CTA Bilaterally without wheezes or rales. Increased effort, tachypnea.  GI/Abdomen: soft, non-tender, non-distended.  Positive bowel sounds ostomy with liquid light brown stool; urostomy with blood tinged fluid Ext: no clubbing, cyanosis. Psych: Flat, Anxious    Skin:  abdominal wound  with pink with granulation tissue,dry, covered in clean dressing. - almost healed Left nephrostomy tube with bag seal in place.,  Draining clear urine--STAGE 1 DTI over lateral tubing site R flank draining serous fluid with clean edged, eraser head sized opening,tender firmness underlying skin --improved     Neuro:   AAO.  Oriented to person, place, time, and event.  No apparent cognitive deficits. +HOH Normal language and speech.  Cranial nerve exam unremarkable.  MMT: 5- out of 5 bilateral upper and lower extremities. No limb ataxia or cerebellar signs.  Bilateral upper and lower extremity tremors with intention-- improving Musculoskeletal: No apparent deformities.      Assessment & Plan:   Assessment and Plan    Abdominal wound with delayed healing Wound healing slowly with intermittent drainage, no infection signs. - Follow up with Dr. Sheldon for wound packing in two weeks.  Right nephrostomy tube with local skin breakdown risk Redness and non-blanching at nephrostomy site, no infection signs. - Minimize pressure on nephrostomy site. - Secure dressing to prevent skin breakdown.  Critical illness myopathy with peripheral neuropathy Peripheral neuropathy and muscle weakness. - Continue physical therapy to improve muscle strength and function.  Orthostatic hypotension Orthostatic hypotension managed with midodrine , symptoms improve after standing. - Continue midodrine . - Monitor blood pressure and symptoms. -  Consider cardiology referral if no improvement.  Nausea and anorexia Occasional nausea, decreased appetite likely due to surgeries. -  Prescribed Zofran  4 mg as needed. - Restarted sertraline  50 mg daily, increase to 100 mg after four weeks if tolerated. - Encouraged high-calorie, high-protein diet.  Presence of urostomy and ileostomy, L nephrostomy, with stage 1 DTI - Use padding under tubing on L nephrostomy dressing to prevent further skin breakdown. - Continue current management of supplies. - Ensure adequate supply of ostomy bags.

## 2024-09-16 NOTE — Patient Instructions (Addendum)
  VISIT SUMMARY: You had a follow-up visit after your recent hospitalization. We discussed your fatigue, shortness of breath, orthostatic symptoms, peripheral neuropathy, and wound healing. We also reviewed your current medications and nutritional supplements.  YOUR PLAN: ABDOMINAL WOUND WITH DELAYED HEALING: Your abdominal wound is healing slowly with some drainage but no signs of infection. -Secure back the dressing to prevent skin breakdown; use padding and monitor closely to prevent further injury. Seeing Urology soon for eval. -Follow up with Dr. Sheldon for wound packing in two weeks.  LEFT NEPHROSTOMY TUBE WITH LOCAL SKIN BREAKDOWN RISK: There is redness at the nephrostomy site, but no signs of infection. -Minimize pressure on the nephrostomy site. -Secure the dressing to prevent skin breakdown.  CRITICAL ILLNESS MYOPATHY WITH PERIPHERAL NEUROPATHY: You have muscle weakness and numbness in your toes. -Continue physical therapy to improve muscle strength and function. - Neuropathy not currently painful  ORTHOSTATIC HYPOTENSION: You feel lightheaded and dizzy when standing up, which improves after a minute. -Continue taking midodrine . -Monitor your blood pressure and symptoms. -We may consider a cardiology referral if there is no improvement.  NAUSEA AND ANOREXIA: You have occasional nausea and a decreased appetite, likely due to your surgeries. -Take Zofran  4 mg as needed for nausea. -Restart sertraline  50 mg daily, and increase to 100 mg after four weeks if tolerated. -Follow a high-calorie, high-protein diet.  PRESENCE OF UROSTOMY AND ILEOSTOMY: Your urostomy and ileostomy sites are stable -Continue current management of supplies. -Ensure you have an adequate supply of ostomy bags.

## 2024-09-21 ENCOUNTER — Encounter (HOSPITAL_COMMUNITY): Payer: Self-pay

## 2024-09-21 ENCOUNTER — Inpatient Hospital Stay (HOSPITAL_COMMUNITY)
Admission: EM | Admit: 2024-09-21 | Discharge: 2024-10-01 | DRG: 802 | Disposition: A | Discharge status: Other Acute Inpatient Hospital | Attending: Emergency Medicine | Admitting: Emergency Medicine

## 2024-09-21 ENCOUNTER — Other Ambulatory Visit: Payer: Self-pay

## 2024-09-21 ENCOUNTER — Emergency Department (HOSPITAL_COMMUNITY)

## 2024-09-21 DIAGNOSIS — Z801 Family history of malignant neoplasm of trachea, bronchus and lung: Secondary | ICD-10-CM

## 2024-09-21 DIAGNOSIS — R198 Other specified symptoms and signs involving the digestive system and abdomen: Secondary | ICD-10-CM

## 2024-09-21 DIAGNOSIS — R627 Adult failure to thrive: Secondary | ICD-10-CM | POA: Diagnosis present

## 2024-09-21 DIAGNOSIS — Z933 Colostomy status: Secondary | ICD-10-CM | POA: Diagnosis not present

## 2024-09-21 DIAGNOSIS — G9341 Metabolic encephalopathy: Secondary | ICD-10-CM | POA: Diagnosis present

## 2024-09-21 DIAGNOSIS — I5032 Chronic diastolic (congestive) heart failure: Secondary | ICD-10-CM | POA: Diagnosis present

## 2024-09-21 DIAGNOSIS — Z936 Other artificial openings of urinary tract status: Secondary | ICD-10-CM | POA: Diagnosis not present

## 2024-09-21 DIAGNOSIS — E8721 Acute metabolic acidosis: Secondary | ICD-10-CM | POA: Diagnosis present

## 2024-09-21 DIAGNOSIS — Z932 Ileostomy status: Secondary | ICD-10-CM | POA: Diagnosis not present

## 2024-09-21 DIAGNOSIS — Z9079 Acquired absence of other genital organ(s): Secondary | ICD-10-CM

## 2024-09-21 DIAGNOSIS — R319 Hematuria, unspecified: Secondary | ICD-10-CM | POA: Diagnosis not present

## 2024-09-21 DIAGNOSIS — N1330 Unspecified hydronephrosis: Secondary | ICD-10-CM | POA: Diagnosis present

## 2024-09-21 DIAGNOSIS — E860 Dehydration: Principal | ICD-10-CM | POA: Diagnosis present

## 2024-09-21 DIAGNOSIS — Z8546 Personal history of malignant neoplasm of prostate: Secondary | ICD-10-CM

## 2024-09-21 DIAGNOSIS — E1122 Type 2 diabetes mellitus with diabetic chronic kidney disease: Secondary | ICD-10-CM | POA: Diagnosis present

## 2024-09-21 DIAGNOSIS — I130 Hypertensive heart and chronic kidney disease with heart failure and stage 1 through stage 4 chronic kidney disease, or unspecified chronic kidney disease: Secondary | ICD-10-CM | POA: Diagnosis present

## 2024-09-21 DIAGNOSIS — Z792 Long term (current) use of antibiotics: Secondary | ICD-10-CM

## 2024-09-21 DIAGNOSIS — Z6823 Body mass index (BMI) 23.0-23.9, adult: Secondary | ICD-10-CM

## 2024-09-21 DIAGNOSIS — B9620 Unspecified Escherichia coli [E. coli] as the cause of diseases classified elsewhere: Secondary | ICD-10-CM | POA: Diagnosis present

## 2024-09-21 DIAGNOSIS — E440 Moderate protein-calorie malnutrition: Secondary | ICD-10-CM

## 2024-09-21 DIAGNOSIS — R197 Diarrhea, unspecified: Secondary | ICD-10-CM | POA: Diagnosis present

## 2024-09-21 DIAGNOSIS — I2510 Atherosclerotic heart disease of native coronary artery without angina pectoris: Secondary | ICD-10-CM | POA: Diagnosis present

## 2024-09-21 DIAGNOSIS — Z9049 Acquired absence of other specified parts of digestive tract: Secondary | ICD-10-CM

## 2024-09-21 DIAGNOSIS — Z7901 Long term (current) use of anticoagulants: Secondary | ICD-10-CM

## 2024-09-21 DIAGNOSIS — R5381 Other malaise: Secondary | ICD-10-CM | POA: Diagnosis present

## 2024-09-21 DIAGNOSIS — N1832 Chronic kidney disease, stage 3b: Secondary | ICD-10-CM | POA: Diagnosis present

## 2024-09-21 DIAGNOSIS — R571 Hypovolemic shock: Secondary | ICD-10-CM | POA: Diagnosis present

## 2024-09-21 DIAGNOSIS — Z86711 Personal history of pulmonary embolism: Secondary | ICD-10-CM

## 2024-09-21 DIAGNOSIS — L02211 Cutaneous abscess of abdominal wall: Principal | ICD-10-CM | POA: Diagnosis present

## 2024-09-21 DIAGNOSIS — I808 Phlebitis and thrombophlebitis of other sites: Secondary | ICD-10-CM | POA: Diagnosis present

## 2024-09-21 DIAGNOSIS — R578 Other shock: Secondary | ICD-10-CM | POA: Diagnosis present

## 2024-09-21 DIAGNOSIS — Z789 Other specified health status: Secondary | ICD-10-CM

## 2024-09-21 DIAGNOSIS — Z8551 Personal history of malignant neoplasm of bladder: Secondary | ICD-10-CM

## 2024-09-21 DIAGNOSIS — N136 Pyonephrosis: Secondary | ICD-10-CM | POA: Diagnosis present

## 2024-09-21 DIAGNOSIS — D62 Acute posthemorrhagic anemia: Secondary | ICD-10-CM | POA: Diagnosis present

## 2024-09-21 DIAGNOSIS — Z8249 Family history of ischemic heart disease and other diseases of the circulatory system: Secondary | ICD-10-CM

## 2024-09-21 DIAGNOSIS — E43 Unspecified severe protein-calorie malnutrition: Secondary | ICD-10-CM | POA: Diagnosis present

## 2024-09-21 DIAGNOSIS — Z800 Family history of malignant neoplasm of digestive organs: Secondary | ICD-10-CM

## 2024-09-21 DIAGNOSIS — Z87891 Personal history of nicotine dependence: Secondary | ICD-10-CM

## 2024-09-21 DIAGNOSIS — N390 Urinary tract infection, site not specified: Secondary | ICD-10-CM | POA: Diagnosis not present

## 2024-09-21 DIAGNOSIS — Z87440 Personal history of urinary (tract) infections: Secondary | ICD-10-CM

## 2024-09-21 DIAGNOSIS — I951 Orthostatic hypotension: Secondary | ICD-10-CM | POA: Diagnosis present

## 2024-09-21 DIAGNOSIS — K631 Perforation of intestine (nontraumatic): Secondary | ICD-10-CM | POA: Diagnosis present

## 2024-09-21 DIAGNOSIS — Z79899 Other long term (current) drug therapy: Secondary | ICD-10-CM

## 2024-09-21 DIAGNOSIS — G4730 Sleep apnea, unspecified: Secondary | ICD-10-CM | POA: Diagnosis not present

## 2024-09-21 DIAGNOSIS — L02217 Cutaneous abscess of flank: Secondary | ICD-10-CM | POA: Diagnosis not present

## 2024-09-21 DIAGNOSIS — H9190 Unspecified hearing loss, unspecified ear: Secondary | ICD-10-CM | POA: Diagnosis present

## 2024-09-21 DIAGNOSIS — R311 Benign essential microscopic hematuria: Secondary | ICD-10-CM | POA: Diagnosis not present

## 2024-09-21 DIAGNOSIS — D631 Anemia in chronic kidney disease: Secondary | ICD-10-CM | POA: Diagnosis present

## 2024-09-21 DIAGNOSIS — N189 Chronic kidney disease, unspecified: Secondary | ICD-10-CM | POA: Insufficient documentation

## 2024-09-21 DIAGNOSIS — E871 Hypo-osmolality and hyponatremia: Secondary | ICD-10-CM | POA: Diagnosis present

## 2024-09-21 DIAGNOSIS — N179 Acute kidney failure, unspecified: Secondary | ICD-10-CM | POA: Diagnosis present

## 2024-09-21 DIAGNOSIS — Z85828 Personal history of other malignant neoplasm of skin: Secondary | ICD-10-CM

## 2024-09-21 DIAGNOSIS — T148XXD Other injury of unspecified body region, subsequent encounter: Secondary | ICD-10-CM | POA: Insufficient documentation

## 2024-09-21 DIAGNOSIS — G4733 Obstructive sleep apnea (adult) (pediatric): Secondary | ICD-10-CM | POA: Diagnosis present

## 2024-09-21 DIAGNOSIS — K432 Incisional hernia without obstruction or gangrene: Secondary | ICD-10-CM | POA: Diagnosis present

## 2024-09-21 DIAGNOSIS — L988 Other specified disorders of the skin and subcutaneous tissue: Secondary | ICD-10-CM | POA: Diagnosis not present

## 2024-09-21 DIAGNOSIS — E875 Hyperkalemia: Secondary | ICD-10-CM | POA: Diagnosis present

## 2024-09-21 DIAGNOSIS — K660 Peritoneal adhesions (postprocedural) (postinfection): Secondary | ICD-10-CM | POA: Diagnosis present

## 2024-09-21 DIAGNOSIS — Z906 Acquired absence of other parts of urinary tract: Secondary | ICD-10-CM

## 2024-09-21 DIAGNOSIS — R579 Shock, unspecified: Secondary | ICD-10-CM | POA: Diagnosis not present

## 2024-09-21 DIAGNOSIS — E46 Unspecified protein-calorie malnutrition: Secondary | ICD-10-CM | POA: Diagnosis not present

## 2024-09-21 DIAGNOSIS — T148XXA Other injury of unspecified body region, initial encounter: Secondary | ICD-10-CM

## 2024-09-21 DIAGNOSIS — R109 Unspecified abdominal pain: Secondary | ICD-10-CM | POA: Insufficient documentation

## 2024-09-21 DIAGNOSIS — G4700 Insomnia, unspecified: Secondary | ICD-10-CM | POA: Diagnosis present

## 2024-09-21 LAB — I-STAT CHEM 8, ED
BUN: 60 mg/dL — ABNORMAL HIGH (ref 8–23)
Calcium, Ion: 1.58 mmol/L (ref 1.15–1.40)
Chloride: 104 mmol/L (ref 98–111)
Creatinine, Ser: 2.8 mg/dL — ABNORMAL HIGH (ref 0.61–1.24)
Glucose, Bld: 113 mg/dL — ABNORMAL HIGH (ref 70–99)
HCT: 41 % (ref 39.0–52.0)
Hemoglobin: 13.9 g/dL (ref 13.0–17.0)
Potassium: 5.2 mmol/L — ABNORMAL HIGH (ref 3.5–5.1)
Sodium: 130 mmol/L — ABNORMAL LOW (ref 135–145)
TCO2: 21 mmol/L — ABNORMAL LOW (ref 22–32)

## 2024-09-21 LAB — CBC WITH DIFFERENTIAL/PLATELET
Abs Immature Granulocytes: 0.05 K/uL (ref 0.00–0.07)
Basophils Absolute: 0.1 K/uL (ref 0.0–0.1)
Basophils Relative: 1 %
Eosinophils Absolute: 0.1 K/uL (ref 0.0–0.5)
Eosinophils Relative: 1 %
HCT: 41.5 % (ref 39.0–52.0)
Hemoglobin: 13.1 g/dL (ref 13.0–17.0)
Immature Granulocytes: 0 %
Lymphocytes Relative: 10 %
Lymphs Abs: 1.2 K/uL (ref 0.7–4.0)
MCH: 25.1 pg — ABNORMAL LOW (ref 26.0–34.0)
MCHC: 31.6 g/dL (ref 30.0–36.0)
MCV: 79.5 fL — ABNORMAL LOW (ref 80.0–100.0)
Monocytes Absolute: 1.2 K/uL — ABNORMAL HIGH (ref 0.1–1.0)
Monocytes Relative: 9 %
Neutro Abs: 10.1 K/uL — ABNORMAL HIGH (ref 1.7–7.7)
Neutrophils Relative %: 79 %
Platelets: 460 K/uL — ABNORMAL HIGH (ref 150–400)
RBC: 5.22 MIL/uL (ref 4.22–5.81)
RDW: 16.1 % — ABNORMAL HIGH (ref 11.5–15.5)
WBC: 12.7 K/uL — ABNORMAL HIGH (ref 4.0–10.5)
nRBC: 0 % (ref 0.0–0.2)

## 2024-09-21 LAB — COMPREHENSIVE METABOLIC PANEL WITH GFR
ALT: 25 U/L (ref 0–44)
AST: 26 U/L (ref 15–41)
Albumin: 4.2 g/dL (ref 3.5–5.0)
Alkaline Phosphatase: 119 U/L (ref 38–126)
Anion gap: 15 (ref 5–15)
BUN: 49 mg/dL — ABNORMAL HIGH (ref 8–23)
CO2: 20 mmol/L — ABNORMAL LOW (ref 22–32)
Calcium: 14.2 mg/dL (ref 8.9–10.3)
Chloride: 97 mmol/L — ABNORMAL LOW (ref 98–111)
Creatinine, Ser: 2.53 mg/dL — ABNORMAL HIGH (ref 0.61–1.24)
GFR, Estimated: 26 mL/min — ABNORMAL LOW (ref >60)
Glucose, Bld: 122 mg/dL — ABNORMAL HIGH (ref 70–99)
Potassium: 5.2 mmol/L — ABNORMAL HIGH (ref 3.5–5.1)
Sodium: 132 mmol/L — ABNORMAL LOW (ref 135–145)
Total Bilirubin: 0.5 mg/dL (ref 0.0–1.2)
Total Protein: 8.8 g/dL — ABNORMAL HIGH (ref 6.5–8.1)

## 2024-09-21 LAB — PROTIME-INR
INR: 1.3 — ABNORMAL HIGH (ref 0.8–1.2)
Prothrombin Time: 16.9 s — ABNORMAL HIGH (ref 11.4–15.2)

## 2024-09-21 LAB — URINALYSIS, W/ REFLEX TO CULTURE (INFECTION SUSPECTED)
Bacteria, UA: NONE SEEN
Bilirubin Urine: NEGATIVE
Glucose, UA: NEGATIVE mg/dL
Ketones, ur: NEGATIVE mg/dL
Nitrite: NEGATIVE
Protein, ur: >=300 mg/dL — AB
RBC / HPF: >50 RBC/hpf (ref 0–5)
Specific Gravity, Urine: 1.013 (ref 1.005–1.030)
WBC, UA: >50 WBC/hpf (ref 0–5)
pH: 6 (ref 5.0–8.0)

## 2024-09-21 LAB — MAGNESIUM: Magnesium: 2.5 mg/dL — ABNORMAL HIGH (ref 1.7–2.4)

## 2024-09-21 LAB — I-STAT CG4 LACTIC ACID, ED
Lactic Acid, Venous: 1.6 mmol/L (ref 0.5–1.9)
Lactic Acid, Venous: 1.9 mmol/L (ref 0.5–1.9)

## 2024-09-21 LAB — C DIFFICILE QUICK SCREEN W PCR REFLEX
C Diff antigen: NEGATIVE
C Diff interpretation: NOT DETECTED
C Diff toxin: NEGATIVE

## 2024-09-21 LAB — APTT: aPTT: 39 s — ABNORMAL HIGH (ref 24–36)

## 2024-09-21 LAB — LIPASE, BLOOD: Lipase: 22 U/L (ref 11–51)

## 2024-09-21 LAB — HEPARIN LEVEL (UNFRACTIONATED): Heparin Unfractionated: >1.1 [IU]/mL — ABNORMAL HIGH (ref 0.30–0.70)

## 2024-09-21 MED ORDER — CALCIUM POLYCARBOPHIL 625 MG PO TABS
625.0000 mg | ORAL_TABLET | Freq: Two times a day (BID) | ORAL | Status: DC
  Administered 2024-09-22 – 2024-10-01 (×19): 625 mg via ORAL
  Filled 2024-09-21 (×22): qty 1

## 2024-09-21 MED ORDER — MELATONIN 3 MG PO TABS
3.0000 mg | ORAL_TABLET | Freq: Every day | ORAL | Status: DC
  Administered 2024-09-22 – 2024-09-30 (×10): 3 mg via ORAL
  Filled 2024-09-21 (×11): qty 1

## 2024-09-21 MED ORDER — MIDODRINE HCL 5 MG PO TABS
10.0000 mg | ORAL_TABLET | Freq: Three times a day (TID) | ORAL | Status: DC
  Administered 2024-09-22 – 2024-09-23 (×5): 10 mg via ORAL
  Filled 2024-09-21 (×5): qty 2

## 2024-09-21 MED ORDER — SODIUM CHLORIDE 0.9 % IV SOLN: INTRAVENOUS | Status: AC

## 2024-09-21 MED ORDER — HEPARIN (PORCINE) 25000 UT/250ML-% IV SOLN
1050.0000 [IU]/h | INTRAVENOUS | Status: AC
  Administered 2024-09-21 – 2024-09-23 (×3): 1200 [IU]/h via INTRAVENOUS
  Filled 2024-09-21 (×3): qty 250

## 2024-09-21 MED ORDER — SALINE SPRAY 0.65 % NA SOLN: 1.0000 | Freq: Four times a day (QID) | NASAL | Status: DC | PRN

## 2024-09-21 MED ORDER — IPRATROPIUM BROMIDE 0.06 % NA SOLN: 1.0000 | Freq: Three times a day (TID) | NASAL | Status: DC | PRN

## 2024-09-21 MED ORDER — HYDROXYZINE HCL 25 MG PO TABS: 25.0000 mg | ORAL_TABLET | Freq: Three times a day (TID) | ORAL | Status: DC | PRN

## 2024-09-21 MED ORDER — ROSUVASTATIN CALCIUM 5 MG PO TABS
10.0000 mg | ORAL_TABLET | Freq: Every evening | ORAL | Status: DC
  Administered 2024-09-22 – 2024-09-30 (×8): 10 mg via ORAL
  Filled 2024-09-21: qty 2
  Filled 2024-09-21: qty 1
  Filled 2024-09-21: qty 2
  Filled 2024-09-21 (×3): qty 1
  Filled 2024-09-21 (×2): qty 2

## 2024-09-21 MED ORDER — SERTRALINE HCL 50 MG PO TABS
50.0000 mg | ORAL_TABLET | Freq: Every day | ORAL | Status: DC
  Administered 2024-09-22 – 2024-10-01 (×10): 50 mg via ORAL
  Filled 2024-09-21 (×10): qty 1

## 2024-09-21 MED ORDER — SODIUM CHLORIDE 0.9 % IV BOLUS
1000.0000 mL | Freq: Once | INTRAVENOUS | Status: AC
  Administered 2024-09-21: 1000 mL via INTRAVENOUS

## 2024-09-21 MED ORDER — ONDANSETRON HCL 4 MG/2ML IJ SOLN
4.0000 mg | Freq: Once | INTRAMUSCULAR | Status: AC
  Administered 2024-09-21: 4 mg via INTRAVENOUS
  Filled 2024-09-21: qty 2

## 2024-09-21 MED ORDER — LACTATED RINGERS IV BOLUS: 1000.0000 mL | Freq: Once | INTRAVENOUS | Status: DC

## 2024-09-21 MED ORDER — LOPERAMIDE HCL 2 MG PO CAPS
4.0000 mg | ORAL_CAPSULE | Freq: Three times a day (TID) | ORAL | Status: DC
  Administered 2024-09-22: 4 mg via ORAL
  Filled 2024-09-21: qty 2

## 2024-09-21 MED ORDER — SODIUM CHLORIDE 0.9 % IV SOLN: Freq: Once | INTRAVENOUS | Status: AC

## 2024-09-21 MED ORDER — VITAMIN B-12 100 MCG PO TABS
500.0000 ug | ORAL_TABLET | ORAL | Status: DC
  Administered 2024-09-23 – 2024-09-30 (×4): 500 ug via ORAL
  Filled 2024-09-21 (×2): qty 1
  Filled 2024-09-21 (×2): qty 5

## 2024-09-21 MED ORDER — ACETAMINOPHEN 325 MG PO TABS
325.0000 mg | ORAL_TABLET | Freq: Four times a day (QID) | ORAL | Status: DC | PRN
  Administered 2024-09-22 – 2024-09-30 (×8): 650 mg via ORAL
  Filled 2024-09-21 (×10): qty 2

## 2024-09-21 NOTE — Plan of Care (Signed)
 FMTS Interim Progress Note  The patient is a 73 year old presenting with nausea, vomiting, and increased watery output from his ileostomy bag ongoing for 1-2 weeks with acute worsening 1-2 days ago. Noted to have increasing fatigue.   Gastrointestinal symptoms - Vomiting is present, no hematemesis  - Increased watery output from ileostomy bag noted. - Imodium  stopped at home due to nausea and inability to tolerate PO - AM meds taken today, unable to take afternoon and evening medications   Renal dysfunction - Acute kidney injury present with creatinine elevated to 2.8 from baseline of 1.35 noted at presentation   Laboratory abnormalities - Hyponatremia noted on laboratory findings. - Hypercalcemia present. - Mild leukocytosis observed. - currently on NS 125 ml/hr infusion   CT CAP:  bilateral hydronephrosis, stent appears intact, stranding and thickening of the renal pelvis on one kidney, possible fistula from distal right ureter to abdominal wall  Urology consulted in the ED. Recommended Rocephin  overnight. They will evaluate him in person in the morning.   BP 100/72   Pulse 81   Temp 98.2 F (36.8 C) (Oral)   Resp 20   Ht 5' 11 (1.803 m)   Wt 74.8 kg   SpO2 100%   BMI 23.01 kg/m   Uncomfortable appearing on exam No increased work of breathing    FMTS paged for admission for unassigned patient. Full H&P to follow.   Cleotilde Perkins, DO 09/21/2024, 6:44 PM PGY-3, [facility] Service pager [phone]

## 2024-09-21 NOTE — Assessment & Plan Note (Signed)
 PE w/ Chronic anticoagulation : Hold Eliquis  5 mg tab BID last dose this morning. Will place him on Heparin  gtt for now pending urology recommendations Anxiety : Continue ATARAX  25 mg TID PRN, Zoloft  50 mg Daily  Insomnia: Continue Melatonin 3 mg at bedtime HLD : Continue CRESTOR  10 mg at bedtime Hypotensive : Continue Midodrine  10 mg TID w/ meal  High output ileostomy (HCC) Imodium  4 g TID GERD : Starting IV PPI DM II : Monitor Daily BG w/ AM BMP  CKD stage 3b baseline GFR 30-44   OSA  CHF Hearing loss

## 2024-09-21 NOTE — ED Provider Notes (Signed)
 4:05 PM Patient signed out to me by previous ED physician. Pt is a 73 yo male nausea/vomiting with hx of ileostomy tubes and nephrostomy tubes.   AKI present Dehydration present  Pending: CT images  Plan: admit for AKI    Physical Exam  BP 100/72   Pulse 81   Temp 98.2 F (36.8 C) (Oral)   Resp 20   Ht 5' 11 (1.803 m)   Wt 74.8 kg   SpO2 100%   BMI 23.01 kg/m   Physical Exam  Procedures  Procedures  ED Course / MDM    Medical Decision Making Amount and/or Complexity of Data Reviewed Radiology: ordered.  Risk Prescription drug management. Decision regarding hospitalization.   CT demonstrates: 1. No CT evidence for acute intrathoracic abnormality. No acute airspace disease. 2. Status post cystoprostatectomy with right abdominal ileal conduit. Right ureteral stent in place with stable to minimally increased mild to moderate right hydronephrosis, post removal of right percutaneous nephrostomy. Slight increased stranding at the right renal pelvis and ureter compared to prior, with diffuse urothelial thickening of left renal pelvis and ureter, correlate for upper UTI. Left-sided percutaneous nephrostomy catheter remains in place and there is no left hydronephrosis. 3. Redemonstrated heterogenous collection within the right abdominal wall near the urostomy. Increased gas within the fluid collection compared to the most recent prior with additional finding of small gas within the ileal conduit and within the distal right ureter adjacent to the stent. The heterogenous abdominal wall collection also appears to communicate with the right lateral flank skin surface as may be seen with a fistula. Increased gas within the collection compared to the prior exam could be secondary to superimposed infection, other consideration could include fistula or leak related to the urostomy. 4. Other chronic findings as previously described 5. Aortic atherosclerosis.  I spoke  with Dr. Lovie from urology who has reviewed scans and recommends-hydronephrosis a little worse on the right. Ostomy look about the same. Would not recommend changing nephrostomy tubes just yet. Urine is to be expected for a patient with stents and nephrostomy tubes. Cultures pending. Admit for iv hydration and starting rocephin  just incase there is infection although not entirely convinced at this point. Patient wife states that he stopped taking his imodium  due to nausea at the house which caused increased watery stool output through ileostomy.  This could have led to dehydration that caused AKI.  Fluids at 125/h running.  Patient admitted to family medicine team.  Urology will see in the morning.       Elnor Bernarda SQUIBB, DO 09/21/24 1825

## 2024-09-21 NOTE — ED Notes (Signed)
 Dr. Elnor notified of critical  calcium .

## 2024-09-21 NOTE — ED Triage Notes (Addendum)
 Pt's wife states pt is not feeling right and when pt was discharged from hospital to bring pt back. Pt has been nauseated, unable to eat for past week. Pt denies pain. Pt states he had started feeling weak a few days ago and worsened today. Pt has had infections in mesh that was placed in July.

## 2024-09-21 NOTE — ED Triage Notes (Signed)
 Patient state he doesn't feel right. He was told if anything changes to come back to the ED and he has been having a decrease in appetite, fatigue, weakness, shob after exertion.

## 2024-09-21 NOTE — ED Triage Notes (Signed)
 Patients wife reports he has been in and out of the hospital for multiple infections over the past 3-4 months.

## 2024-09-21 NOTE — ED Notes (Signed)
 Called [facility] and made them aware patient was on the way to their floor

## 2024-09-21 NOTE — ED Provider Triage Note (Signed)
 Emergency Medicine Provider Triage Evaluation Note  Kristopher Thompson , a 73 y.o. male  was evaluated in triage.  Pt complains of nausea, vomiting, weakness and abdominal pain.  Was admitted for sepsis and of August.  Subsequently had intra-abdominal abscess.  He has ileostomy as well as nephrostomy tubes.  He has had copious watery drainage from his ileostomy tube.  No obvious blood per family.  Ostomy bag has also had dark in, malodorous thick urine.  He has not been eating and drinking over the last week.  Has generalized abdominal pain.  Chills without documented fever.  Review of Systems  Positive: Abdominal pain, nausea, change in ileostomy and nephrostomy tubes Negative: CP, SOB  Physical Exam  BP 100/77 (BP Location: Left Arm)   Pulse (!) 116   Temp (!) 97.5 F (36.4 C)   Resp 18   Ht 5' 11 (1.803 m)   Wt 74.8 kg   SpO2 99%   BMI 23.01 kg/m  Gen:   Awake, no distress   Resp:  Normal effort  ABD:  Soft, non tender, watery yellow drainage right ileostomy bag,  MSK:   Moves extremities without difficulty  Other:    Medical Decision Making  Medically screening exam initiated at 12:19 PM.  Appropriate orders placed.  Jadrien E Meany was informed that the remainder of the evaluation will be completed by another provider, this initial triage assessment does not replace that evaluation, and the importance of remaining in the ED until their evaluation is complete.  Weakness, tachycardia, saw blood pressures.  Will get sepsis workup   Graylen Noboa A, PA-C 09/21/24 1221

## 2024-09-21 NOTE — H&P (Shared)
 Hospital Admission History and Physical Service Pager: [phone]  Patient name: Kristopher Thompson Medical record number: [id number] Date of Birth: 21-Jul-1951 Age: 73 y.o. Gender: male  Primary Care Provider: [name], MD  Consultants: Urology Code Status: Full Code which was confirmed with family if patient unable to confirm   Preferred Emergency Contact:  Alternate Contact Person    Freels,Debbie (Spouse) [phone] (Mobile)   Chief Complaint: nausea, vomiting, weakness and abdominal pain. Unable to tolerate PO  Differential and Medical Decision Making:  [name] is a 73 y.o. male presenting with nausea, vomiting, weakness, abdominal pain, and failure to thrive.  Differential for this patient's presentation of this includes   Recurrent abdominal abscess most likely : Pt has not been able to tolerated PO intake CTCAP w/ c/o worsening gas within the collection compared to the prior exam could be secondary to superimposed infection, other consideration could include fistula or leak related to the urostomy. WBC 12.7 on admission Urology was consulted   Sepsis is unlikely patient w/ hx of unhealing wound s/p multiple complex abdominal surgeries. No hypotensive on admission. While WBC 12.7 w/ superimposed infection on CTCAP. He has no fever Tm 98.9 q/ HR 66-88 Normal lactate of 1.9 on admission  SBO less likely : patient states he has been nauseating denies emesis with his extensive abdominal surgeries SBO could be considered. However per patient and his wife, his ostomy w/ good output. 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 Madelon  - Vital signs per floor - Consults: Urology - Fluids: IVF until tolerated PO - Antibiotics: received Rocephin  IV once in ED - Blood culture collected and pending result  - Pain control: Tylenol  1000 mg Q6H PRN, Oxycodone  5-10 mg Q6H PRN - Bowel Reg: MiraLax ,  SENNA - AM Labs: CBC, BMP - Fall precautions - Daily weight - Dietitian consult - Delirium precautions - PT/OT consult  Hyperkalemia Potassium 5.2 on admission. No significant change on EKG  - No intervention at this time  - Will continue to monitor  Hypercalcemia Corrected serum Calcium  14.0, ionized 1.58 - PTH collected and pending result - Given 1L NS bolus with 125 ml/hr NS - Given SubQ Calcitonin 4 unit/kg once  - Zoledronic Acid contraindicated with current CrCl - Will repeat calcium  ion w/ BMP in AM Delayed wound healing Patient w/ abdominal wound s/p bowel perforation with open abd --> LOA; abd wall I&D; ileal resection; end ileostomy; abd wall parastomal/incisional hernia repair with phasix mesh (dissolvable in 1 year); fascia closure and wound vac placement which is now removed. Pt also w/ Right lateral abdominal wound w/ milky drainage likely unheal nephrostomy tube removal site  - Wound care consult  Chronic health problem PE w/ Chronic anticoagulation : Hold Eliquis  5 mg tab BID last dose this morning. 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 male presenting with nausea, vomiting, weakness and abdominal pain.  Was admitted for sepsis and of August.  Subsequently had intra-abdominal abscess.  He has ileostomy as well as nephrostomy tubes.  He has had copious watery drainage from his ileostomy tube.  No obvious blood per family.  Ostomy bag has also  had dark in, malodorous thick urine.  He has not been eating and drinking over the last week.  Has generalized abdominal pain.  Chills without documented fever.  In the ED, He was hemodynamically stable pertinent labs including   Review Of Systems: Per HPI with the following additions:  Pertinent Past Medical History: Remainder reviewed in history tab.   Pertinent Past Surgical History: Appendectomy 1972 SBR 04/20/2024 Cholecystectomy 1985 Colostomy reversal 01/08/2018 Ileo conduit  Ex Laparotomy 04/20/24 Partial colectomy 04/21/24  Robotic assisted Lap Radical Prostectomy  Robotic assisted Lap Bladder diverticulectomy 08/08/2017 Robotic assisted Lap radical cystectomy  converted to open pelvic bilateral lymphodectomy and ileal conduit urinary diversion  Transurethral resection of bladder tumor 06/10/17 Ventral hernia repair 04/10/24    Remainder reviewed in history tab.  Pertinent Social History: Tobacco use: Former Alcohol  use: Denies Other Substance use: Denies Lives with wife  Pertinent Family History: None contributing   Important Outpatient Medications: Eliquis  5 mg tab BID last dose this AM 10/10 ATARAX  25 mg TID PRN Zoloft  50 mg Daily Melatonin 3 mg at bedtime CRESTOR  10 mg at bedtime Imodium  4 g TID Midodrine  10 mg TID w/ meal   Objective: BP 100/72   Pulse 81   Temp 98.2 F (36.8 C) (Oral)   Resp 20   Ht 5' 11 (1.803 m)   Wt 74.8 kg   SpO2 100%   BMI 23.01 kg/m  Exam:  General: NAD, pleasant  Cardio: RRR, no MRG. Respiratory: CTAB, normal wob on RA GI: Abdomen is soft, not tender, not distended. Skin: Surgical site wound on abdomen, clean/healing. Ileostomy with bag in place. White, non-purulent drainage from right sided nephrostomy. Images in chart.  Labs:  CBC BMET  Recent Labs  Lab 09/21/24 1232 09/21/24 1526  WBC 12.7*  --   HGB 13.1 13.9  HCT 41.5 41.0  PLT 460*  --    Recent Labs  Lab 09/21/24 1232 09/21/24 1526  NA 132* 130*  K 5.2*  5.2*  CL 97* 104  CO2 20*  --   BUN 49* 60*  CREATININE 2.53* 2.80*  GLUCOSE 122* 113*  CALCIUM  14.2*  --        Imaging Studies Performed:  EXAM: CT CHEST, ABDOMEN AND PELVIS WITHOUT CONTRAST  IMPRESSION: 1. No CT evidence for acute intrathoracic abnormality. No acute airspace disease. 2. Status post cystoprostatectomy with right abdominal ileal conduit. Right ureteral stent in place with stable to minimally increased mild to moderate right hydronephrosis, post removal of right percutaneous nephrostomy. Slight increased stranding at the right renal pelvis and ureter compared to prior, with diffuse urothelial thickening of left renal pelvis and ureter, correlate for upper UTI. Left-sided percutaneous nephrostomy catheter remains in place and there is no left hydronephrosis. 3. Redemonstrated heterogenous collection within the right abdominal wall near the urostomy. Increased gas within the fluid collection compared to the most recent prior with additional finding of small gas within the ileal conduit and within the distal right ureter adjacent to the stent. The heterogenous abdominal wall collection also appears to communicate with the right lateral flank skin surface as may be seen with a fistula.  Increased gas within the collection compared to the prior exam could be secondary to superimposed infection, other consideration could include fistula or leak related to the urostomy. 4. Other chronic findings as previously described 5. Aortic atherosclerosis.   Suzen Houston NOVAK, DO 09/21/2024, 7:06 PM PGY-1, [facility]  FPTS Intern pager: [phone], text pages welcome Secure chat group [facility] Teaching Service

## 2024-09-21 NOTE — ED Notes (Signed)
 Rounded on pt. Breathing is even and unlabored. Support persons at bedside.

## 2024-09-21 NOTE — H&P (Incomplete)
 Hospital Admission History and Physical Service Pager: [phone]  Patient name: Kristopher Thompson Medical record number: [id number] Date of Birth: 04/11/1951 Age: 73 y.o. Gender: male  Primary Care Provider: [name], MD  Consultants: Urology Code Status: Full Code which was confirmed with family if patient unable to confirm   Preferred Emergency Contact:  Alternate Contact Person    Krol,Debbie (Spouse) [phone] (Mobile)   Chief Complaint: nausea, vomiting, weakness and abdominal pain. Unable to tolerate PO  Differential and Medical Decision Making:  Kristopher Thompson is a 73 y.o. male presenting with nausea, vomiting, weakness, abdominal pain, and failure to thrive.  Differential for this patient's presentation of this includes   Recurrent abdominal abscess most likely : Pt has not been able to tolerated PO intake CTCAP w/ c/o worsening gas within the collection compared to the prior exam could be secondary to superimposed infection, other consideration could include fistula or leak related to the urostomy. WBC 12.7 on admission Urology was consulted   SBO less likely : patient states he has been nauseating denies emesis with his extensive abdominal surgeries SBO could be considered. However per patient and his wife, his ostomy w/ good output. CTCAP no evidence of SBO    ***, ***, and ***.  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 Rumball  - Vital signs per floor - Consults: Urology - Fluids: IVF until tolerated PO - Antibiotics: Rocephin  IV - Pain control: Tylenol  1000 mg Q6H PRN, Oxycodone  5-10 mg Q6H PRN - Bowel Reg: MiraLax , SENNA - Blood culture collected and pending result  - AM Labs: CBC, BMP - Fall precautions - Daily weight - Dietitian consult - Delirium precautions - PT/OT consult  Hyperkalemia Potassium 5.2  on admission. No significant change on EKG  - No intervention at this time  - Will continue to monitor  Hypercalcemia Calcium  ionized 1.58  - PTH collected and pending result - Given  CKD (chronic kidney disease)  Delayed wound healing Patient w/ abdominal wound s/p bowel perforation with open abd --> LOA; abd wall I&D; ileal resection; end ileostomy; abd wall parastomal/incisional hernia repair with phasix mesh (dissolvable in 1 year); fascia closure and wound vac placement which is now removed. Pt also w/ Right lateral abdominal wound w/ milky drainage likely unheal nephrostomy tube removal site  - Wound care consult  Chronic health problem PE w/ Chronic anticoagulation : Eliquis  5 mg tab BID last dose this morning.  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 male presenting with nausea, vomiting, weakness and abdominal pain.  Was admitted for sepsis and of August.  Subsequently had intra-abdominal abscess.  He has ileostomy as well as nephrostomy tubes.  He has had copious watery drainage from his ileostomy tube.  No obvious blood per family.  Ostomy bag has also had dark in, malodorous thick urine.  He has not been eating and drinking over the last  week.  Has generalized abdominal pain.  Chills without documented fever.  In the ED, He was hemodynamically stable pertinent labs including   Review Of Systems: Per HPI with the following additions:  Pertinent Past Medical History: Remainder reviewed in history tab.   Pertinent Past Surgical History: Appendectomy 1972 SBR  04/20/2024 Cholecystectomy 1985 Colostomy reversal 01/08/2018 Ileo conduit  Ex Laparotomy 04/20/24 Partial colectomy 04/21/24  Robotic assisted Lap Radical Prostectomy  Robotic assisted Lap Bladder diverticulectomy 08/08/2017 Robotic assisted Lap radical cystectomy  converted to open pelvic bilateral lymphodectomy and ileal conduit urinary diversion  Transurethral resection of bladder tumor 06/10/17 Ventral hernia repair 04/10/24    Remainder reviewed in history tab.  Pertinent Social History: Tobacco use: Yes/No/Former Alcohol  use: *** Other Substance use: *** Lives with ***  Pertinent Family History: None contributing   Important Outpatient Medications: Eliquis  5 mg tab BID last dose this AM 10/10 ATARAX  25 mg TID PRN Zoloft  50 mg Daily Melatonin 3 mg at bedtime CRESTOR  10 mg at bedtime Imodium  4 g TID Multivitamin B12  Midodrine        Objective: BP 100/72   Pulse 81   Temp 98.2 F (36.8 C) (Oral)   Resp 20   Ht 5' 11 (1.803 m)   Wt 74.8 kg   SpO2 100%   BMI 23.01 kg/m  Exam:   Labs:  CBC BMET  Recent Labs  Lab 09/21/24 1232 09/21/24 1526  WBC 12.7*  --   HGB 13.1 13.9  HCT 41.5 41.0  PLT 460*  --    Recent Labs  Lab 09/21/24 1232 09/21/24 1526  NA 132* 130*  K 5.2* 5.2*  CL 97* 104  CO2 20*  --   BUN 49* 60*  CREATININE 2.53* 2.80*  GLUCOSE 122* 113*  CALCIUM  14.2*  --        Imaging Studies Performed:    Suzen Houston NOVAK, DO 09/21/2024, 7:06 PM PGY-1, [facility]  FPTS Intern pager: [phone], text pages welcome Secure chat group [facility] Teaching Service

## 2024-09-21 NOTE — Assessment & Plan Note (Signed)
 [place]

## 2024-09-21 NOTE — Assessment & Plan Note (Signed)
-   Admit to FMTS, attending Dr. Rumball  - Vital signs per floor - Consults: Urology - Fluids: IVF until tolerated PO - Antibiotics: received Rocephin  IV once in ED - Blood culture collected and pending result  - Pain control: Tylenol  1000 mg Q6H PRN, Oxycodone  5-10 mg Q6H PRN - Bowel Reg: MiraLax , SENNA - AM Labs: CBC, BMP - Fall precautions - Daily weight - Dietitian consult - Delirium precautions - PT/OT consult

## 2024-09-21 NOTE — ED Provider Notes (Signed)
 [place] EMERGENCY DEPARTMENT AT [facility] Provider Note   CSN: [id number] Arrival date & time: 09/21/24  1133     Patient presents with: No chief complaint on file.   Kristopher Thompson is a 73 y.o. male.  {Add pertinent medical, surgical, social history, OB history to HPI:8351} 73 year old male with prior medical history as detailed below presents for evaluation. Pt complains of nausea, vomiting, weakness and abdominal pain.  Was admitted for sepsis and of August.  Subsequently had intra-abdominal abscess.  He has ileostomy as well as nephrostomy tubes.  He has had copious watery drainage from his ileostomy tube.  No obvious blood per family.  Ostomy bag has also had dark in, malodorous thick urine.  He has not been eating and drinking over the last week.  Has generalized abdominal pain.  Chills without documented fever.  The history is provided by the patient and medical records.       Prior to Admission medications   Medication Sig Start Date End Date Taking? Authorizing Provider  acetaminophen  (TYLENOL ) 325 MG tablet Take 1-2 tablets (325-650 mg total) by mouth every 4 (four) hours as needed for mild pain (pain score 1-3). 06/18/24   Jerilynn Daphne SAILOR, NP  cyanocobalamin  1000 MCG tablet Take 0.5 tablets (500 mcg total) by mouth every Monday, Wednesday, and Friday. 08/14/24   Love, Sharlet RAMAN, PA-C  ELIQUIS  5 MG TABS tablet Take 1 tablet (5 mg total) by mouth 2 (two) times daily. 08/13/24   Love, Sharlet RAMAN, PA-C  feeding supplement (ENSURE PLUS HIGH PROTEIN) LIQD Take 237 mLs by mouth 2 (two) times daily between meals. Patient taking differently: Take 237 mLs by mouth daily. 05/22/24   Sherrill Cable Latif, DO  hydrOXYzine  (ATARAX ) 25 MG tablet Take 1 tablet (25 mg total) by mouth 3 (three) times daily as needed for anxiety. 08/13/24   Love, Sharlet RAMAN, PA-C  ipratropium (ATROVENT ) 0.03 % nasal spray Place 1 spray into both nostrils every 8 (eight) hours as needed (allergies).     [provider]  leptospermum manuka honey (MEDIHONEY) PSTE paste Apply 1 Application topically daily. 06/19/24   Jerilynn Daphne SAILOR, NP  loperamide  (IMODIUM ) 2 MG capsule Take 2 capsules (4 mg total) by mouth 3 (three) times daily. 08/13/24   Love, Sharlet RAMAN, PA-C  melatonin 3 MG TABS tablet Take 1 tablet (3 mg total) by mouth at bedtime. 08/13/24   Love, Sharlet RAMAN, PA-C  midodrine  (PROAMATINE ) 5 MG tablet Take 2 tablets (10 mg total) by mouth 3 (three) times daily with meals. 08/13/24   Love, Sharlet RAMAN, PA-C  Nystatin (GERHARDT'S BUTT CREAM) CREA Apply 1 Application topically 2 (two) times daily. 06/18/24   Jerilynn Daphne SAILOR, NP  Omega-3 Fatty Acids (FISH OIL) 1000 MG CAPS Take 1,000 mg by mouth every Monday, Wednesday, and Friday. Patient not taking: Reported on 09/16/2024    [provider]  ondansetron  (ZOFRAN -ODT) 4 MG disintegrating tablet Take 1 tablet (4 mg total) by mouth daily as needed for nausea or vomiting. 09/16/24   Emeline Joesph BROCKS, DO  Pediatric Multiple Vitamins (CHILDRENS MULTIVITAMIN) chewable tablet Chew 1 tablet by mouth 2 (two) times daily. 08/13/24   Love, Sharlet RAMAN, PA-C  polycarbophil (FIBERCON) 625 MG tablet Take 1 tablet (625 mg total) by mouth 2 (two) times daily. 08/13/24   Love, Sharlet RAMAN, PA-C  rosuvastatin  (CRESTOR ) 10 MG tablet Take 1 tablet (10 mg total) by mouth every evening. 08/13/24   Maurice Sharlet RAMAN, PA-C  sertraline  (ZOLOFT ) 50 MG tablet Take 1 tablet (50 mg total) by mouth daily for 30 days, THEN 2 tablets (100 mg total) daily. 09/16/24 11/15/24  Emeline Joesph BROCKS, DO  sodium chloride  (OCEAN) 0.65 % SOLN nasal spray Place 1-2 sprays into both nostrils every 6 (six) hours as needed for congestion (dry nose). 05/22/24   Sherrill Alejandro Donovan, DO    Allergies: Demerol  [meperidine ]    Review of Systems  All other systems reviewed and are negative.   Updated Vital Signs BP 100/77 (BP Location: Left Arm)   Pulse (!) 116   Temp (!) 97.5 F (36.4 C)   Resp 18    Ht 5' 11 (1.803 m)   Wt 74.8 kg   SpO2 99%   BMI 23.01 kg/m   Physical Exam Vitals and nursing note reviewed.  Constitutional:      General: He is not in acute distress.    Appearance: Normal appearance. He is well-developed.  HENT:     Head: Normocephalic and atraumatic.  Eyes:     Conjunctiva/sclera: Conjunctivae normal.     Pupils: Pupils are equal, round, and reactive to light.  Cardiovascular:     Rate and Rhythm: Normal rate and regular rhythm.     Heart sounds: Normal heart sounds.  Pulmonary:     Effort: Pulmonary effort is normal. No respiratory distress.     Breath sounds: Normal breath sounds.  Abdominal:     General: There is no distension.     Palpations: Abdomen is soft.     Tenderness: There is no abdominal tenderness.     Comments: Ileostomy, nephrostomy tubes in place.  Musculoskeletal:        General: No deformity. Normal range of motion.     Cervical back: Normal range of motion and neck supple.  Skin:    General: Skin is warm and dry.  Neurological:     General: No focal deficit present.     Mental Status: He is alert and oriented to person, place, and time.     (all labs ordered are listed, but only abnormal results are displayed) Labs Reviewed  CBC WITH DIFFERENTIAL/PLATELET - Abnormal; Notable for the following components:      Result Value   WBC 12.7 (*)    MCV 79.5 (*)    MCH 25.1 (*)    RDW 16.1 (*)    Platelets 460 (*)    Neutro Abs 10.1 (*)    Monocytes Absolute 1.2 (*)    All other components within normal limits  PROTIME-INR - Abnormal; Notable for the following components:   Prothrombin Time 16.9 (*)    INR 1.3 (*)    All other components within normal limits  URINALYSIS, W/ REFLEX TO CULTURE (INFECTION SUSPECTED) - Abnormal; Notable for the following components:   APPearance TURBID (*)    Hgb urine dipstick MODERATE (*)    Protein, ur >=300 (*)    Leukocytes,Ua MODERATE (*)    All other components within normal limits   CULTURE, BLOOD (ROUTINE X 2)  CULTURE, BLOOD (ROUTINE X 2)  C DIFFICILE QUICK SCREEN W PCR REFLEX    GASTROINTESTINAL PANEL BY PCR, STOOL (REPLACES STOOL CULTURE)  URINE CULTURE  COMPREHENSIVE METABOLIC PANEL WITH GFR  LIPASE, BLOOD  MAGNESIUM   I-STAT CG4 LACTIC ACID, ED  I-STAT CG4 LACTIC ACID, ED    EKG: EKG Interpretation Date/Time:  Monday September 21 2024 14:28:49 EST Ventricular Rate:  90 PR Interval:  190 QRS Duration:  142  QT Interval:  374 QTC Calculation: 457 R Axis:   -49  Text Interpretation: Normal sinus rhythm Left axis deviation Right bundle branch block Abnormal ECG When compared with ECG of 21-Sep-2024 11:58, PREVIOUS ECG IS PRESENT Confirmed by Laurice Coy [phone]) on 09/21/2024 2:33:22 PM  Radiology: DG Chest 1 View Result Date: 09/21/2024 CLINICAL DATA:  Sepsis. EXAM: CHEST  1 VIEW COMPARISON:  Chest radiograph dated 08/04/2024. FINDINGS: No focal consolidation, pleural effusion, or pneumothorax. The cardiac silhouette is within normal limits. No acute osseous pathology. IMPRESSION: No active disease. Electronically Signed   By: Vanetta Chou M.D.   On: 09/21/2024 13:22    {Document cardiac monitor, telemetry assessment procedure when appropriate:32947} Procedures   Medications Ordered in the ED - No data to display    {Click here for ABCD2, HEART and other calculators REFRESH Note before signing:1}                              Medical Decision Making Amount and/or Complexity of Data Reviewed Radiology: ordered.  Risk Prescription drug management.   ***  {Document critical care time when appropriate  Document review of labs and clinical decision tools ie CHADS2VASC2, etc  Document your independent review of radiology images and any outside records  Document your discussion with family members, caretakers and with consultants  Document social determinants of health affecting pt's care  Document your decision making why or why not  admission, treatments were needed:32947:::1}   Final diagnoses:  None    ED Discharge Orders     None

## 2024-09-21 NOTE — Progress Notes (Signed)
 ANTICOAGULATION CONSULT NOTE  Pharmacy Consult for Heparin  Indication: hx of PE on apixaban   Allergies  Allergen Reactions   Demerol  [Meperidine ] Nausea And Vomiting    Patient Measurements: Height: 5' 11 (180.3 cm) Weight: 74.8 kg (165 lb) IBW/kg (Calculated) : 75.3 Heparin  Dosing Weight: 74.8 kg  Vital Signs: Temp: 98.4 F (36.9 C) (11/10 1916) Temp Source: Oral (11/10 1916) BP: 123/77 (11/10 1916) Pulse Rate: 103 (11/10 1916)  Labs: Recent Labs    09/21/24 1232 09/21/24 1526  HGB 13.1 13.9  HCT 41.5 41.0  PLT 460*  --   LABPROT 16.9*  --   INR 1.3*  --   CREATININE 2.53* 2.80*    Estimated Creatinine Clearance: 25.2 mL/min (A) (by C-G formula based on SCr of 2.8 mg/dL (H)).   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   moderate atypia - right low back   Atypical nevus 04/04/2007   moderate to marked - right upper back (wider shave)   Atypical nevus 04/04/2007   moderate atypia - center chest (wider shave)   Atypical nevus 04/04/2007   slight atypia - right thigh   Atypical nevus 11/29/2011   mild atypia - center upper back   Atypical nevus 11/29/2011   mild atypia - center chest   Bacteremia due to Klebsiella pneumoniae 10/09/2017   Bladder cancer (HCC) dx 07/2017   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 denies   Sleep apnea    cpap   Squamous cell carcinoma of skin 05/22/2013   left cheek - CX3 + 5FU   Wears glasses     Medications:  (Not in a hospital admission)  Scheduled:  Infusions:  PRN:   Assessment: 73 yom presenting with copious watery drainage from his ileostomy tube . Heparin  per pharmacy consult placed for hx of PE on apixaban  unable to take PO.  Patient is on apixaban  prior to arrival. Last dose 9am 11/10. Will require aPTT monitoring due to likely falsely high anti-Xa level secondary to DOAC use.  Hgb 13.9; plt 460 PT/INR 16.9/1.3  Goal of Therapy:  Heparin  level 0.3-0.7 units/ml aPTT 66-102 seconds Monitor platelets by anticoagulation protocol: Yes   Plan:  No initial heparin  bolus Start heparin  infusion at 1200 units/hr Check aPTT & anti-Xa level in 8 hours and daily while on heparin  Continue to monitor via aPTT until levels are correlated Continue to monitor H&H and platelets  Dorn Buttner, PharmD, BCPS 09/21/2024 8:51 PM ED Clinical Pharmacist -  [phone]

## 2024-09-21 NOTE — ED Notes (Signed)
 Urine collected from left side.  Urine cloudy and malodorous.

## 2024-09-22 DIAGNOSIS — L988 Other specified disorders of the skin and subcutaneous tissue: Secondary | ICD-10-CM | POA: Diagnosis not present

## 2024-09-22 DIAGNOSIS — E43 Unspecified severe protein-calorie malnutrition: Secondary | ICD-10-CM | POA: Insufficient documentation

## 2024-09-22 DIAGNOSIS — E440 Moderate protein-calorie malnutrition: Secondary | ICD-10-CM

## 2024-09-22 LAB — HEPARIN LEVEL (UNFRACTIONATED): Heparin Unfractionated: >1.1 [IU]/mL — ABNORMAL HIGH (ref 0.30–0.70)

## 2024-09-22 LAB — APTT
aPTT: 75 s — ABNORMAL HIGH (ref 24–36)
aPTT: 66 s — ABNORMAL HIGH (ref 24–36)

## 2024-09-22 LAB — GASTROINTESTINAL PANEL BY PCR, STOOL (REPLACES STOOL CULTURE)

## 2024-09-22 LAB — CBC
HCT: 34.2 % — ABNORMAL LOW (ref 39.0–52.0)
Hemoglobin: 11.2 g/dL — ABNORMAL LOW (ref 13.0–17.0)
MCH: 25.2 pg — ABNORMAL LOW (ref 26.0–34.0)
MCHC: 32.7 g/dL (ref 30.0–36.0)
MCV: 77 fL — ABNORMAL LOW (ref 80.0–100.0)
Platelets: 332 K/uL (ref 150–400)
RBC: 4.44 MIL/uL (ref 4.22–5.81)
RDW: 16.2 % — ABNORMAL HIGH (ref 11.5–15.5)
WBC: 10.5 K/uL (ref 4.0–10.5)
nRBC: 0 % (ref 0.0–0.2)

## 2024-09-22 LAB — COMPREHENSIVE METABOLIC PANEL WITH GFR
ALT: 18 U/L (ref 0–44)
AST: 19 U/L (ref 15–41)
Albumin: 2.9 g/dL — ABNORMAL LOW (ref 3.5–5.0)
Alkaline Phosphatase: 79 U/L (ref 38–126)
Anion gap: 13 (ref 5–15)
BUN: 46 mg/dL — ABNORMAL HIGH (ref 8–23)
CO2: 16 mmol/L — ABNORMAL LOW (ref 22–32)
Calcium: 11.6 mg/dL — ABNORMAL HIGH (ref 8.9–10.3)
Chloride: 105 mmol/L (ref 98–111)
Creatinine, Ser: 2.37 mg/dL — ABNORMAL HIGH (ref 0.61–1.24)
GFR, Estimated: 28 mL/min — ABNORMAL LOW (ref >60)
Glucose, Bld: 109 mg/dL — ABNORMAL HIGH (ref 70–99)
Potassium: 4.4 mmol/L (ref 3.5–5.1)
Sodium: 134 mmol/L — ABNORMAL LOW (ref 135–145)
Total Bilirubin: 0.6 mg/dL (ref 0.0–1.2)
Total Protein: 6.6 g/dL (ref 6.5–8.1)

## 2024-09-22 LAB — PHOSPHORUS: Phosphorus: 3.6 mg/dL (ref 2.5–4.6)

## 2024-09-22 LAB — PREALBUMIN: Prealbumin: 14 mg/dL — ABNORMAL LOW (ref 18–38)

## 2024-09-22 MED ORDER — CALCITONIN (SALMON) 200 UNIT/ML IJ SOLN
4.0000 [IU]/kg | Freq: Once | INTRAMUSCULAR | Status: AC
  Administered 2024-09-22: 300 [IU] via SUBCUTANEOUS
  Filled 2024-09-22: qty 1.5

## 2024-09-22 MED ORDER — JUVEN PO PACK
1.0000 | PACK | Freq: Two times a day (BID) | ORAL | Status: DC
  Administered 2024-09-23 – 2024-10-01 (×13): 1 via ORAL
  Filled 2024-09-22 (×14): qty 1

## 2024-09-22 MED ORDER — LOPERAMIDE HCL 2 MG PO CAPS
2.0000 mg | ORAL_CAPSULE | Freq: Four times a day (QID) | ORAL | Status: DC | PRN
  Administered 2024-09-24: 2 mg via ORAL
  Filled 2024-09-22: qty 1

## 2024-09-22 MED ORDER — PROCHLORPERAZINE EDISYLATE 10 MG/2ML IJ SOLN
10.0000 mg | Freq: Four times a day (QID) | INTRAMUSCULAR | Status: DC | PRN
  Administered 2024-09-22 – 2024-09-30 (×11): 10 mg via INTRAVENOUS
  Filled 2024-09-22 (×12): qty 2

## 2024-09-22 MED ORDER — FERROUS SULFATE 325 (65 FE) MG PO TABS
325.0000 mg | ORAL_TABLET | Freq: Two times a day (BID) | ORAL | Status: DC
  Administered 2024-09-22 – 2024-10-01 (×18): 325 mg via ORAL
  Filled 2024-09-22 (×18): qty 1

## 2024-09-22 MED ORDER — ADULT MULTIVITAMIN W/MINERALS CH
1.0000 | ORAL_TABLET | Freq: Every day | ORAL | Status: DC
  Administered 2024-09-22 – 2024-10-01 (×10): 1 via ORAL
  Filled 2024-09-22 (×10): qty 1

## 2024-09-22 MED ORDER — ONDANSETRON HCL 4 MG/2ML IJ SOLN: 4.0000 mg | Freq: Four times a day (QID) | INTRAMUSCULAR | Status: DC | PRN

## 2024-09-22 MED ORDER — SODIUM CHLORIDE 0.9 % IV BOLUS
1000.0000 mL | Freq: Every day | INTRAVENOUS | Status: AC
  Administered 2024-09-22 – 2024-09-23 (×2): 1000 mL via INTRAVENOUS

## 2024-09-22 MED ORDER — ONDANSETRON HCL 4 MG/2ML IJ SOLN: 4.0000 mg | Freq: Four times a day (QID) | INTRAMUSCULAR | Status: DC

## 2024-09-22 MED ORDER — LOPERAMIDE HCL 2 MG PO CAPS
4.0000 mg | ORAL_CAPSULE | Freq: Two times a day (BID) | ORAL | Status: DC
  Administered 2024-09-22 – 2024-09-24 (×4): 4 mg via ORAL
  Filled 2024-09-22 (×4): qty 2

## 2024-09-22 MED ORDER — PIPERACILLIN-TAZOBACTAM 3.375 G IVPB
3.3750 g | Freq: Three times a day (TID) | INTRAVENOUS | Status: DC
  Administered 2024-09-22 – 2024-09-27 (×16): 3.375 g via INTRAVENOUS
  Filled 2024-09-22 (×16): qty 50

## 2024-09-22 NOTE — Progress Notes (Signed)
 ANTICOAGULATION CONSULT NOTE  Pharmacy Consult for Heparin  Indication: hx of PE on apixaban   Allergies  Allergen Reactions   Demerol  [Meperidine ] Nausea And Vomiting    Patient Measurements: Height: 5' 11 (180.3 cm) Weight: 74.8 kg (165 lb) IBW/kg (Calculated) : 75.3 Heparin  Dosing Weight: 74.8 kg  Vital Signs: Temp: 98.4 F (36.9 C) (11/11 0556) Temp Source: Oral (11/11 0245) BP: 108/67 (11/11 0556) Pulse Rate: 100 (11/11 0556)  Labs: Recent Labs    09/21/24 1232 09/21/24 1526 09/21/24 2150 09/22/24 0442  HGB 13.1 13.9  --  11.2*  HCT 41.5 41.0  --  34.2*  PLT 460*  --   --  332  APTT  --   --  39* 75*  LABPROT 16.9*  --   --   --   INR 1.3*  --   --   --   HEPARINUNFRC  --   --  >1.10* >1.10*  CREATININE 2.53* 2.80*  --   --     Estimated Creatinine Clearance: 25.2 mL/min (A) (by C-G formula based on SCr of 2.8 mg/dL (H)).  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 Heparin  per pharmacy consult placed for hx of PE on apixaban  unable to take PO. Patient is on apixaban  prior to arrival. Last dose 9am 11/10. Will require aPTT monitoring due to likely falsely high anti-Xa level secondary to DOAC use. Hgb 13.9; plt 460  -AM: heparin  level falsely elevated (given apixaban  use) and aPTT within goal on 1200 units/hr. Per RN no signs/symptoms of bleeding. CBC shows Hgb 13.9 > 11.2, and plts 332.  Goal of Therapy:  Heparin  level 0.3-0.7 units/ml aPTT 66-102 seconds Monitor platelets by anticoagulation protocol: Yes   Plan:  Continue heparin  infusion at 1200 units/hr Check confirmatory aPTT in 8 hours  Continue to monitor via aPTT until levels are correlated Continue to monitor H&H and platelets  Lynwood Poplar, PharmD, BCPS Clinical Pharmacist 09/22/2024 6:09 AM

## 2024-09-22 NOTE — Progress Notes (Addendum)
 09/22/2024  Kristopher Thompson Kristopher Thompson [id number] May 25, 1951  CARE TEAM: PCP: [name], MD  Outpatient Care Team: Patient Care Team: [name], MD as PCP - General (Internal Medicine) [name], MD as Consulting Physician (Dermatology) [name], MD as Referring Physician (Pulmonary Disease) [name], MD as Consulting Physician (General Surgery) [name], MD as Consulting Physician (Urology)  Inpatient Treatment Team: Treatment Team:  [name], DO [name], [place] SAUNDERS, OT [name], PT [name], MD [name], MD [name], RN [place], [place] [name], RN [name], MD [name], [name], RN [name], RD [place], [name], [facility] [name], MD   Problem List:   Principal Problem:   Fistula Active Problems:   Inability to tolerate diet   Chronic health problem   Hyperkalemia   CKD (chronic kidney disease)   Hypercalcemia   Open wound   Delayed wound healing   04/10/2024  POST-OPERATIVE DIAGNOSIS:  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   PROCEDURE:   -ROBOTIC LYSIS OF ADHESIONS X 4 HOURS -COMPONENT SEPARATION - TRANSVERSUS ABDOMINIS REALEASE (TAR) BILATERAL -ROBOTIC REPAIRS OF  INCISIONAL, PARASTOMAL, LEFT INGUINAL  INCARCERATED ABDOMINAL WALL HERNIAS WITH MESH -UROSTOMY ILEAL CONDUIT REVISION -INTRAOPERATIVE ASSESSMENT OF TISSUE VASCULAR PERFUSION USING ICG (indocyanine green ) -IMMUNOFLUORESCENCE -TRANSVERSUS ABDOMINIS PLANE (TAP) BLOCK - BILATERAL    SURGEON:  [name], MD   04/20/2024 POST-OPERATIVE DIAGNOSIS:  perforated bowel   PROCEDURE:  Drainage of right abdominal wall abscess as well as intra-abdominal abscess Explantation of abdominal mesh Small bowel resection (in discontinuity) Placement of ABThera wound VAC   SURGEON:  [name], MD  04/21/2024  POST-OPERATIVE DIAGNOSIS:  DELAYED BOWEL PERFORATION WITH OPEN ABDOMEN    PROCEDURE:   LYSIS OF ADHESIONS X ABDOMINAL WALL DEBRIDEMENT ILEAL RESECTION END ILEOSTOMY ABDOMINAL WALL PARASTOMAL & INCISIONAL HERNIA REPAIRS WITH PHASIX MESH FASCIA CLOSURE WITH WOUND VAC PLACEMENT IN SQ   SURGEON:  [name], MD    05/04/2024 Interventional Radiology Procedure Note   Procedure: Bilateral percutaneous nephrostomy tube placements   Findings: Please refer to procedural dictation for full description. 10 Fr bilateral to bag drainage.   Complications: None immediate   Estimated Blood Loss: < 5 ml   Recommendations: Keep to bag drainage.  IR will follow.  Ultimate management as per Urology.   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  Chronic drain tract not healing well.  Concern for possible deeper chronic abscess.     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  Hard to say if this is leading to the urine infections or the other way around.  Overall abdominal wall not worse.  Just not fully resolved.  Deeper collection seems to have shrunk but there are still some dots of gas that raises concern of recurrent infection.  Not able to manage with cauterizations and mild packing.  I think he would benefit from operative abdominal wall I&D & exploration.  Incision of right flank old drain site and debridement.  Debrided some of the chronic drain tract and get cleaner tissue to pack.  Rule out deeper abscess.  Rule out mesh infection.  Try to avoid interfering with the right upper quadrant end ileostomy or the right lower quadrant  ileal conduit urostomy.  Possible reculture to rule out recurrent infection.  See if we can get her to clean up faster.  I am rather booked this week but we will see if we can find time either Wednesday or Thursday evening or late Friday morning. The anatomy and physiology of the region was discussed. The pathophysiology of subcutaneous abscess formation with progression to fasciitis & sepsis was discussed.  Need for incision, drainage, debridement discussed.  I stressed good hygiene & need for repeated wound care.  Possible redebridement / reoperation was discussed as well. Possibility of recurrence was discussed.   Risks of bleeding, infection, abscess, leak, injury to other organs, need for repair of tissues / organs, need for further treatment, heart attack, death, and other risks were discussed.  Benefits, alternatives were discussed. I noted a good likelihood this will help address the problem.  Questions answered.  The patient agrees to proceed.  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  Patient has absorbable Phasix mesh in place.  Infectious disease had wanted chronic antibiotics x 6 months, but patient had intolerance to p.o. antibiotics with nausea vomiting and intolerance to those medications so we stopped those.  He was able to eat and get his nutrition better.  His drainage is not massively worse on the right flank, just not resolving.  Right upper quadrant end ileostomy care.  I think he is getting watery dehydrated.  Get back on fiber twice daily to help thicken up, iron  twice daily.  Loperamide  4 mg twice daily and as needed.  Try and get volume RUQ  fecal ileostomy<1000 mL a day  Delayed ileal ureteral conduit leak status post perc nephrostomy tubes, stenting, and abdominal wall drain.  Resolved.  Chronic stenting given CKD and stricturing and ureteral ileal anastomoses Abdominal wound drain removed due to chronic infection with per consistent sinus tract.  See above.   Right renal  system without nephrostomy tube.  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     Defer to urology but I believe they just wish to manage with antibiotics for right now and follow-up as an outpatient.    Nutrition: Chronically severely malnourished.  Now only moderately so.  Check prealbumin.  Was finally getting normal.  PO Diet as tolerated.  Regular diet.  Check nutrition labs.  Midline incision wound very superficial now with good granulation.  Continue washing daily and cover with dry gauze/ABD pad   Metabolic encephalopathy resolved.  Follow.  Insomnia improved  ABLA on top of anemia of chronic disease nearly normal at this point.  Hold off on extra supplementation     -monitor electrolytes & replace as needed.  Keep K>4, Mg>2, Phos>3.    -Diabetes.  Sliding scale insulin .    -VTE prophylaxis-  Full anticoagulation given history of pulmonary embolism.  Eliquis .  -Mobilize as tolerated.  He remains very deconditioned and low motivation to exercise.  Hopefully therapies can work with him more to get him stronger.  Wife strongly agrees  I updated the patient's status to the the patient's wife and nurse.  Recommendations were made.  Questions were answered.  They expressed understanding & appreciation.  -Disposition:    I reviewed nursing notes, last 24 h vitals and pain scores, last 48 h  intake and output, last 24 h labs and trends, and last 24 h imaging results.  I have reviewed this patient's available data, including medical history, events of note, test results, etc as part of my evaluation.   A significant portion of that time was spent in counseling. Care during the described time interval was provided by me.  This care required high  level of medical decision making.  09/22/2024    Subjective: (Chief complaint)  Patient's wife called the  office to notify me he had been admitted.  I was not aware. Bounce back with some nausea decreased intake and dehydration Hematuria after stent exchange and on Eliquis . Last took Eliquis  yesterday morning.  On heparin  drip with some hematuria. Having loose diarrhea on ileostomy   Objective:  Vital signs:  Vitals:   09/22/24 0245 09/22/24 0556 09/22/24 0820 09/22/24 0854  BP: 104/69 108/67 (!) 88/55 102/61  Pulse: 80 100 88 82  Resp: 18 20 19 20   Temp: 98 F (36.7 C) 98.4 F (36.9 C) 98 F (36.7 C) 97.8 F (36.6 C)  TempSrc: Oral  Oral Oral  SpO2: 99% 96% 100% 100%  Weight:      Height:        Last BM Date : 09/21/24  Intake/Output   Yesterday:  11/10 0701 - 11/11 0700 In: 707.8 [I.V.:707.8] Out: 375 [Urine:75; Stool:300] This shift:  No intake/output data recorded.  Bowel function:  Flatus: YES  BM:  YES thin watery feculent output right upper quadrant of fecal ileostomy   Physical Exam:  General: Thin but not frankly cachectic.  Resting in no acute distress.  More alert and interactive.  Oriented x 4 Eyes: Glasses PERRL, normal EOM.  Sclera clear.  No icterus Neuro: CN II-XII intact w/o focal sensory/motor deficits. Lymph: No head/neck/groin lymphadenopathy Psych:  No delerium/psychosis/paranoia.  No agitation HENT: Normocephalic, Mucus membranes moist.  No thrush.  Remains severely hard of hearing  Neck: Supple, No tracheal deviation.  No obvious thyromegaly Chest: No pain to chest wall compression.  Good respiratory excursion.   CV:  Pulses intact.  Regular rhythm.  No extremity edema MS:  No obvious deformity  Abdomen:  Obese Soft.  Nondistended.  No tenderness nor guarding. 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  Old drain site  with scant opening.  Looks like serous drainage.  Tenderness to palpation of thickened subcutaneous mass consistent with chronic drain tract.  No definite purulence but uncomfortable.  Hard to tell what the drainage is since he had a blowout of his ileostomy and there is stool leaking everywhere No peau d'orange.  No cellulitis or crepitus.    Ext:   No deformity.  No edema.  No cyanosis Skin: No petechiae / purpurea.  No major sores.  Warm and dry    Results:   Cultures: Recent Results (from the past 720 hours)  Blood Culture (routine x 2)     Status: None (Preliminary result)   Collection Time: 09/21/24 12:32 PM   Specimen: BLOOD  Result Value Ref Range Status   Specimen Description BLOOD SITE NOT SPECIFIED  Final   Special Requests   Final    BOTTLES DRAWN AEROBIC AND ANAEROBIC Blood Culture adequate volume   Culture   Final    NO GROWTH <  24 HOURS Performed at [facility], [address], [place], [place] [address]    Report Status PENDING  Incomplete  Urine Culture     Status: Abnormal (Preliminary result)   Collection Time: 09/21/24 12:43 PM   Specimen: Urine, Random  Result Value Ref Range Status   Specimen Description URINE, RANDOM  Final   Special Requests URINE, RANDOM  Final   Culture (A)  Final    >=100,000 COLONIES/mL ESCHERICHIA COLI SUSCEPTIBILITIES TO FOLLOW Performed at [facility], [address], [place], [place] [address]    Report Status PENDING  Incomplete  C Difficile Quick Screen w PCR reflex     Status: None   Collection Time: 09/21/24  7:47 PM   Specimen: STOOL  Result Value Ref Range Status   C Diff antigen NEGATIVE NEGATIVE Final   C Diff toxin NEGATIVE NEGATIVE Final   C Diff interpretation No C. difficile detected.  Final    Comment: Performed at [facility], [address], [place], [place] [address]  Gastrointestinal Panel by PCR , Stool     Status: None   Collection Time: 09/21/24  7:47 PM   Specimen: STOOL  Result  Value Ref Range Status   Campylobacter species NOT DETECTED NOT DETECTED Final   Plesimonas shigelloides NOT DETECTED NOT DETECTED Final   Salmonella species NOT DETECTED NOT DETECTED Final   Yersinia enterocolitica NOT DETECTED NOT DETECTED Final   Vibrio species NOT DETECTED NOT DETECTED Final   Vibrio cholerae NOT DETECTED NOT DETECTED Final   Enteroaggregative E coli (EAEC) NOT DETECTED NOT DETECTED Final   Enteropathogenic E coli (EPEC) NOT DETECTED NOT DETECTED Final   Enterotoxigenic E coli (ETEC) NOT DETECTED NOT DETECTED Final   Shiga like toxin producing E coli (STEC) NOT DETECTED NOT DETECTED Final   Shigella/Enteroinvasive E coli (EIEC) NOT DETECTED NOT DETECTED Final   Cryptosporidium NOT DETECTED NOT DETECTED Final   Cyclospora cayetanensis NOT DETECTED NOT DETECTED Final   Entamoeba histolytica NOT DETECTED NOT DETECTED Final   Giardia lamblia NOT DETECTED NOT DETECTED Final   Adenovirus F40/41 NOT DETECTED NOT DETECTED Final   Astrovirus NOT DETECTED NOT DETECTED Final   Norovirus GI/GII NOT DETECTED NOT DETECTED Final   Rotavirus A NOT DETECTED NOT DETECTED Final   Sapovirus (I, II, IV, and V) NOT DETECTED NOT DETECTED Final    Comment: Performed at [facility], [address] [place], [place] [address]  Blood Culture (routine x 2)     Status: None (Preliminary result)   Collection Time: 09/21/24 11:14 PM   Specimen: BLOOD RIGHT HAND  Result Value Ref Range Status   Specimen Description BLOOD RIGHT HAND  Final   Special Requests   Final    BOTTLES DRAWN AEROBIC AND ANAEROBIC Blood Culture adequate volume   Culture   Final    NO GROWTH < 12 HOURS Performed at [facility], [address], [place], [place] [address]    Report Status PENDING  Incomplete     Labs: Results for orders placed or performed during the hospital encounter of 09/21/24 (from the past 48 hours)  Comprehensive metabolic panel     Status: Abnormal   Collection Time: 09/21/24  12:32 PM  Result Value Ref Range   Sodium 132 (L) 135 - 145 mmol/L   Potassium 5.2 (H) 3.5 - 5.1 mmol/L   Chloride 97 (L) 98 - 111 mmol/L   CO2 20 (L) 22 - 32  mmol/L   Glucose, Bld 122 (H) 70 - 99 mg/dL    Comment: Glucose reference range applies only to samples taken after fasting for at least 8 hours.   BUN 49 (H) 8 - 23 mg/dL   Creatinine, Ser 7.46 (H) 0.61 - 1.24 mg/dL   Calcium  14.2 (HH) 8.9 - 10.3 mg/dL    Comment: Critical Value, Read Back and verified with MAOCOLM, O RN AT 1712 ON 09/21/2024 BY PRUDY, K   Total Protein 8.8 (H) 6.5 - 8.1 g/dL   Albumin  4.2 3.5 - 5.0 g/dL   AST 26 15 - 41 U/L   ALT 25 0 - 44 U/L   Alkaline Phosphatase 119 38 - 126 U/L   Total Bilirubin 0.5 0.0 - 1.2 mg/dL   GFR, Estimated 26 (L) >60 mL/min    Comment: (NOTE) Calculated using the CKD-EPI Creatinine Equation (2021)    Anion gap 15 5 - 15    Comment: Performed at [facility], [address], [place], [place] [address]  CBC with Differential     Status: Abnormal   Collection Time: 09/21/24 12:32 PM  Result Value Ref Range   WBC 12.7 (H) 4.0 - 10.5 K/uL   RBC 5.22 4.22 - 5.81 MIL/uL   Hemoglobin 13.1 13.0 - 17.0 g/dL   HCT 58.4 60.9 - 47.9 %   MCV 79.5 (L) 80.0 - 100.0 fL   MCH 25.1 (L) 26.0 - 34.0 pg   MCHC 31.6 30.0 - 36.0 g/dL   RDW 83.8 (H) 88.4 - 84.4 %   Platelets 460 (H) 150 - 400 K/uL   nRBC 0.0 0.0 - 0.2 %   Neutrophils Relative % 79 %   Neutro Abs 10.1 (H) 1.7 - 7.7 K/uL   Lymphocytes Relative 10 %   Lymphs Abs 1.2 0.7 - 4.0 K/uL   Monocytes Relative 9 %   Monocytes Absolute 1.2 (H) 0.1 - 1.0 K/uL   Eosinophils Relative 1 %   Eosinophils Absolute 0.1 0.0 - 0.5 K/uL   Basophils Relative 1 %   Basophils Absolute 0.1 0.0 - 0.1 K/uL   Immature Granulocytes 0 %   Abs Immature Granulocytes 0.05 0.00 - 0.07 K/uL    Comment: Performed at [facility], [address], [place], [place] [address]  Protime-INR     Status: Abnormal   Collection Time: 09/21/24  12:32 PM  Result Value Ref Range   Prothrombin Time 16.9 (H) 11.4 - 15.2 seconds   INR 1.3 (H) 0.8 - 1.2    Comment: (NOTE) INR goal varies based on device and disease states. Performed at [facility], [address], [place], [place] [address]   Blood Culture (routine x 2)     Status: None (Preliminary result)   Collection Time: 09/21/24 12:32 PM   Specimen: BLOOD  Result Value Ref Range   Specimen Description BLOOD SITE NOT SPECIFIED    Special Requests      BOTTLES DRAWN AEROBIC AND ANAEROBIC Blood Culture adequate volume   Culture      NO GROWTH < 24 HOURS Performed at [facility], [address] [place], [place] [address]    Report Status PENDING   Lipase, blood     Status: None   Collection Time: 09/21/24 12:32 PM  Result Value Ref Range   Lipase 22 11 - 51 U/L    Comment: Performed at [facility], [address], [place], [place] [address]  Magnesium   Status: Abnormal   Collection Time: 09/21/24 12:32 PM  Result Value Ref Range   Magnesium  2.5 (H) 1.7 - 2.4 mg/dL    Comment: Performed at [facility], [address], [place], [place] [address]  I-Stat Lactic Acid, ED     Status: None   Collection Time: 09/21/24 12:41 PM  Result Value Ref Range   Lactic Acid, Venous 1.6 0.5 - 1.9 mmol/L  Urinalysis, w/ Reflex to Culture (Infection Suspected) -Urine, Unspecified Source     Status: Abnormal   Collection Time: 09/21/24 12:43 PM  Result Value Ref Range   Specimen Source URINE, UNSPE    Color, Urine YELLOW YELLOW   APPearance TURBID (A) CLEAR   Specific Gravity, Urine 1.013 1.005 - 1.030   pH 6.0 5.0 - 8.0   Glucose, UA NEGATIVE NEGATIVE mg/dL   Hgb urine dipstick MODERATE (A) NEGATIVE   Bilirubin Urine NEGATIVE NEGATIVE   Ketones, ur NEGATIVE NEGATIVE mg/dL   Protein, ur >=699 (A) NEGATIVE mg/dL   Nitrite NEGATIVE NEGATIVE   Leukocytes,Ua MODERATE (A) NEGATIVE   RBC / HPF >50 0 - 5 RBC/hpf   WBC, UA >50 0 -  5 WBC/hpf    Comment:        Reflex urine culture not performed if WBC <=10, OR if Squamous epithelial cells >5. If Squamous epithelial cells >5 suggest recollection.    Bacteria, UA NONE SEEN NONE SEEN   Squamous Epithelial / HPF 0-5 0 - 5 /HPF   WBC Clumps PRESENT    Amorphous Crystal PRESENT     Comment: Performed at [facility], [address], [place], [place] [address]  Urine Culture     Status: Abnormal (Preliminary result)   Collection Time: 09/21/24 12:43 PM   Specimen: Urine, Random  Result Value Ref Range   Specimen Description URINE, RANDOM    Special Requests URINE, RANDOM    Culture (A)     >=100,000 COLONIES/mL ESCHERICHIA COLI SUSCEPTIBILITIES TO FOLLOW Performed at [facility], [address], [place], [place] [address]    Report Status PENDING   I-Stat Lactic Acid, ED     Status: None   Collection Time: 09/21/24  2:53 PM  Result Value Ref Range   Lactic Acid, Venous 1.9 0.5 - 1.9 mmol/L  I-stat chem 8, ED     Status: Abnormal   Collection Time: 09/21/24  3:26 PM  Result Value Ref Range   Sodium 130 (L) 135 - 145 mmol/L   Potassium 5.2 (H) 3.5 - 5.1 mmol/L   Chloride 104 98 - 111 mmol/L   BUN 60 (H) 8 - 23 mg/dL   Creatinine, Ser 7.19 (H) 0.61 - 1.24 mg/dL   Glucose, Bld 886 (H) 70 - 99 mg/dL    Comment: Glucose reference range applies only to samples taken after fasting for at least 8 hours.   Calcium , Ion 1.58 (HH) 1.15 - 1.40 mmol/L   TCO2 21 (L) 22 - 32 mmol/L   Hemoglobin 13.9 13.0 - 17.0 g/dL   HCT 58.9 60.9 - 47.9 %   Comment NOTIFIED PHYSICIAN   C Difficile Quick Screen w PCR reflex     Status: None   Collection Time: 09/21/24  7:47 PM   Specimen: STOOL  Result Value Ref Range   C Diff antigen NEGATIVE NEGATIVE   C Diff toxin NEGATIVE NEGATIVE   C Diff interpretation No C. difficile detected.     Comment: Performed at [facility],  [address] [place], [place] [address]  Gastrointestinal Panel by PCR , Stool      Status: None   Collection Time: 09/21/24  7:47 PM   Specimen: STOOL  Result Value Ref Range   Campylobacter species NOT DETECTED NOT DETECTED   Plesimonas shigelloides NOT DETECTED NOT DETECTED   Salmonella species NOT DETECTED NOT DETECTED   Yersinia enterocolitica NOT DETECTED NOT DETECTED   Vibrio species NOT DETECTED NOT DETECTED   Vibrio cholerae NOT DETECTED NOT DETECTED   Enteroaggregative E coli (EAEC) NOT DETECTED NOT DETECTED   Enteropathogenic E coli (EPEC) NOT DETECTED NOT DETECTED   Enterotoxigenic E coli (ETEC) NOT DETECTED NOT DETECTED   Shiga like toxin producing E coli (STEC) NOT DETECTED NOT DETECTED   Shigella/Enteroinvasive E coli (EIEC) NOT DETECTED NOT DETECTED   Cryptosporidium NOT DETECTED NOT DETECTED   Cyclospora cayetanensis NOT DETECTED NOT DETECTED   Entamoeba histolytica NOT DETECTED NOT DETECTED   Giardia lamblia NOT DETECTED NOT DETECTED   Adenovirus F40/41 NOT DETECTED NOT DETECTED   Astrovirus NOT DETECTED NOT DETECTED   Norovirus GI/GII NOT DETECTED NOT DETECTED   Rotavirus A NOT DETECTED NOT DETECTED   Sapovirus (I, II, IV, and V) NOT DETECTED NOT DETECTED    Comment: Performed at [facility], [address] [place], [place] [address]  Heparin  level (unfractionated)     Status: Abnormal   Collection Time: 09/21/24  9:50 PM  Result Value Ref Range   Heparin  Unfractionated >1.10 (H) 0.30 - 0.70 IU/mL    Comment: (NOTE) The clinical reportable range upper limit is being lowered to >1.10 to align with the FDA approved guidance for the current laboratory assay.  If heparin  results are below expected values, and patient dosage has  been confirmed, suggest follow up testing of antithrombin III levels. Performed at [facility], [address], [place], [place] [address]   APTT     Status: Abnormal   Collection Time: 09/21/24  9:50 PM  Result Value Ref Range   aPTT 39 (H) 24 - 36 seconds    Comment:        IF BASELINE aPTT IS  ELEVATED, SUGGEST PATIENT RISK ASSESSMENT BE USED TO DETERMINE APPROPRIATE ANTICOAGULANT THERAPY. Performed at [facility], [address], [place], [place] [address]   Blood Culture (routine x 2)     Status: None (Preliminary result)   Collection Time: 09/21/24 11:14 PM   Specimen: BLOOD RIGHT HAND  Result Value Ref Range   Specimen Description BLOOD RIGHT HAND    Special Requests      BOTTLES DRAWN AEROBIC AND ANAEROBIC Blood Culture adequate volume   Culture      NO GROWTH < 12 HOURS Performed at [facility], [address], [place], [place] [address]    Report Status PENDING   Comprehensive metabolic panel     Status: Abnormal   Collection Time: 09/22/24  4:42 AM  Result Value Ref Range   Sodium 134 (L) 135 - 145 mmol/L   Potassium 4.4 3.5 - 5.1 mmol/L   Chloride 105 98 - 111 mmol/L   CO2 16 (L) 22 - 32 mmol/L   Glucose, Bld 109 (H) 70 - 99 mg/dL    Comment: Glucose reference range applies only to samples taken after fasting for at least 8 hours.   BUN 46 (H) 8 - 23 mg/dL   Creatinine, Ser 7.62 (H) 0.61 - 1.24 mg/dL   Calcium  11.6 (H)  8.9 - 10.3 mg/dL   Total Protein 6.6 6.5 - 8.1 g/dL   Albumin  2.9 (L) 3.5 - 5.0 g/dL   AST 19 15 - 41 U/L   ALT 18 0 - 44 U/L   Alkaline Phosphatase 79 38 - 126 U/L   Total Bilirubin 0.6 0.0 - 1.2 mg/dL   GFR, Estimated 28 (L) >60 mL/min    Comment: (NOTE) Calculated using the CKD-EPI Creatinine Equation (2021)    Anion gap 13 5 - 15    Comment: Performed at [facility], [address], [place], [place] [address]  CBC     Status: Abnormal   Collection Time: 09/22/24  4:42 AM  Result Value Ref Range   WBC 10.5 4.0 - 10.5 K/uL   RBC 4.44 4.22 - 5.81 MIL/uL   Hemoglobin 11.2 (L) 13.0 - 17.0 g/dL   HCT 65.7 (L) 60.9 - 47.9 %   MCV 77.0 (L) 80.0 - 100.0 fL   MCH 25.2 (L) 26.0 - 34.0 pg   MCHC 32.7 30.0 - 36.0 g/dL   RDW 83.7 (H) 88.4 - 84.4 %   Platelets 332 150 - 400 K/uL   nRBC 0.0 0.0 - 0.2 %    Comment:  Performed at [facility], [address] [place], [place] [address]  Heparin  level (unfractionated)     Status: Abnormal   Collection Time: 09/22/24  4:42 AM  Result Value Ref Range   Heparin  Unfractionated >1.10 (H) 0.30 - 0.70 IU/mL    Comment: (NOTE) The clinical reportable range upper limit is being lowered to >1.10 to align with the FDA approved guidance for the current laboratory assay.  If heparin  results are below expected values, and patient dosage has  been confirmed, suggest follow up testing of antithrombin III levels. Performed at [facility], [address], [place], [place] [address]   APTT     Status: Abnormal   Collection Time: 09/22/24  4:42 AM  Result Value Ref Range   aPTT 75 (H) 24 - 36 seconds    Comment:        IF BASELINE aPTT IS ELEVATED, SUGGEST PATIENT RISK ASSESSMENT BE USED TO DETERMINE APPROPRIATE ANTICOAGULANT THERAPY. Performed at [facility], [address], [place], [place] [address]     Imaging / Studies: CT CHEST ABDOMEN PELVIS WO CONTRAST Result Date: 09/21/2024 CLINICAL DATA:  Sepsis EXAM: CT CHEST, ABDOMEN AND PELVIS WITHOUT CONTRAST TECHNIQUE: Multidetector CT imaging of the chest, abdomen and pelvis was performed following the standard protocol without IV contrast. RADIATION DOSE REDUCTION: This exam was performed according to the departmental dose-optimization program which includes automated exposure control, adjustment of the mA and/or kV according to patient size and/or use of iterative reconstruction technique. COMPARISON:  CT 08/13/2024, 07/31/2024, 07/02/2024, and multiple prior exams FINDINGS: CT CHEST FINDINGS Cardiovascular: Limited evaluation without intravenous contrast. Moderate aortic atherosclerosis. No aneurysm. Multi vessel coronary vascular calcification. Normal cardiac size. No pericardial effusion Mediastinum/Nodes: Patent trachea. Stable appearance of thyroid  gland with nodularity along the inferior  isthmus, no specific imaging follow-up recommended. No suspiciously enlarged lymph nodes. Small hiatal hernia Lungs/Pleura: No acute airspace disease, pleural effusion or pneumothorax. Stable scattered small pulmonary nodules. Punctate 1-2 mm adjacent nodules in the right apex, series 5, image 42. Left upper lobe 3 mm pulmonary nodule series 5, image 48. 3 mm right middle lobe pulmonary nodule series 5, image 99. 3 mm left lung base pulmonary nodule series 5, image 118. Musculoskeletal:  Sternum appears intact. No acute osseous abnormality. Mild chronic superior endplate deformity at T7. CT ABDOMEN PELVIS FINDINGS Hepatobiliary: Cholecystectomy. No focal hepatic abnormality or biliary dilatation. Pancreas: Multiple calcifications consistent with chronic pancreatitis. Atrophy. No inflammation. Stable 12 mm hypodense lesion at the distal pancreas on series 3, image 58. Spleen: Normal in size without focal abnormality. Adrenals/Urinary Tract: Stable adrenal glands. 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 Urothelial thickening of left renal pelvis. Mildly prominent left ureter, also with urothelial thickening. No obstructing stone on the left. Right ureteral stent with proximal pigtail in the right renal collecting system and distal pigtail at the right abdominal urostomy. Right-sided nephrostomy has been removed. Residual mild to moderate right hydronephrosis, stable to minimally increased. Increased stranding at the right renal pelvis compared to prior. Few gas bubbles in the slightly dilated distal right ureter, series 3 image 93 and 95. Stomach/Bowel: Stomach within normal limits. No dilated small bowel. Enteral contrast within the colon. 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 Vascular/Lymphatic: Aortic atherosclerosis. No suspicious lymph nodes  Reproductive: Prostatectomy Other: Negative for ascites. Prominent ventral scar. Redemonstrated heterogenous collection within the right abdominal wall near the urostomy. Increased gas within the fluid collection compared to the most recent prior. This appears to communicate with a soft tissue tract in the right flank area, series 3, image 85 and coronal series 6, image 48 suggesting a fistula. Musculoskeletal: No acute osseous abnormality. Chronic superior endplate deformity at L1. IMPRESSION: 1. No CT evidence for acute intrathoracic abnormality. No acute airspace disease. 2. Status post cystoprostatectomy with right abdominal ileal conduit. Right ureteral stent in place with stable to minimally increased mild to moderate right hydronephrosis, post removal of right percutaneous nephrostomy. Slight increased stranding at the right renal pelvis and ureter compared to prior, with diffuse urothelial thickening of left renal pelvis and ureter, correlate for upper UTI. Left-sided percutaneous nephrostomy catheter remains in place and there is no left hydronephrosis. 3. Redemonstrated heterogenous collection within the right abdominal wall near the urostomy. Increased gas within the fluid collection compared to the most recent prior with additional finding of small gas within the ileal conduit and within the distal right ureter adjacent to the stent. The heterogenous abdominal wall collection also appears to communicate with the right lateral flank skin surface as may be seen with a fistula. Increased gas within the collection compared to the prior exam could be secondary to superimposed infection, other consideration could include fistula or leak related to the urostomy. 4. Other chronic findings as previously described 5. Aortic atherosclerosis. Aortic Atherosclerosis (ICD10-I70.0). Electronically Signed   By: Luke Bun M.D.   On: 09/21/2024 17:42   DG Chest 1 View Result Date: 09/21/2024 CLINICAL DATA:   Sepsis. EXAM: CHEST  1 VIEW COMPARISON:  Chest radiograph dated 08/04/2024. FINDINGS: No focal consolidation, pleural effusion, or pneumothorax. The cardiac silhouette is within normal limits. No acute osseous pathology. IMPRESSION: No active disease. Electronically Signed   By: Vanetta Chou M.D.   On: 09/21/2024 13:22           Medications / Allergies: per chart  Antibiotics: Anti-infectives (From admission, onward)    None         Note: Portions of this report may have been transcribed using voice recognition software. Every effort was made to ensure accuracy; however, inadvertent computerized transcription errors may be present.   Any transcriptional errors  that result from this process are unintentional.    Elspeth KYM Schultze, MD, FACS, MASCRS Esophageal, Gastrointestinal & Colorectal Surgery Robotic and Minimally Invasive Surgery  [facility] [address] [place], [place] [address] [phone] Fax [phone] Main  CONTACT INFORMATION: Weekday (9AM-5PM): Call [facility] main office at [phone] Weeknight (5PM-9AM) or Weekend/Holiday: Check EPIC Web Links tab & use AMION (password  [email or web address]) for General Surgery [facility] coverage  Please, DO NOT use SecureChat  (it is not reliable communication to reach operating surgeons & will lead to a delay in care).   Epic staff messaging available for outptient concerns needing 1-2 business day response.      09/22/2024  1:34 PM

## 2024-09-22 NOTE — Assessment & Plan Note (Addendum)
 Patient w/ abdominal wound s/p bowel perforation with open abd --> LOA; abd wall I&D; ileal resection; end ileostomy; abd wall parastomal/incisional hernia repair with phasix mesh (dissolvable in 1 year); fascia closure and wound vac placement which is now removed. Pt also w/ Right lateral abdominal wound w/ milky drainage likely unheal nephrostomy tube removal site  - Wound care consult

## 2024-09-22 NOTE — Progress Notes (Signed)
 OT Cancellation Note  Patient Details Name: Kristopher Thompson MRN: [id number] DOB: 07-13-1951   Cancelled Treatment:    Reason Eval/Treat Not Completed: (P) Fatigue/lethargy limiting ability to participate, Pt shivering, states he is freezing, given warm blankets, RN informed. Worked with PT earlier, PT reports BP dropping when standing, Pt with poor activity tolerance, will return tomorrow.   Elouise JONELLE Bott 09/22/2024, 2:16 PM

## 2024-09-22 NOTE — Progress Notes (Signed)
 ANTICOAGULATION CONSULT NOTE  Pharmacy Consult for Heparin  Indication: hx of PE on apixaban   Allergies  Allergen Reactions   Demerol  [Meperidine ] Nausea And Vomiting    Patient Measurements: Height: 5' 11 (180.3 cm) Weight: 74.8 kg (165 lb) IBW/kg (Calculated) : 75.3 Heparin  Dosing Weight: 74.8 kg  Vital Signs: Temp: 97.8 F (36.6 C) (11/11 0854) Temp Source: Oral (11/11 0854) BP: 102/61 (11/11 0854) Pulse Rate: 82 (11/11 0854)  Labs: Recent Labs    09/21/24 1232 09/21/24 1526 09/21/24 2150 09/22/24 0442 09/22/24 1309  HGB 13.1 13.9  --  11.2*  --   HCT 41.5 41.0  --  34.2*  --   PLT 460*  --   --  332  --   APTT  --   --  39* 75* 66*  LABPROT 16.9*  --   --   --   --   INR 1.3*  --   --   --   --   HEPARINUNFRC  --   --  >1.10* >1.10*  --   CREATININE 2.53* 2.80*  --  2.37*  --     Estimated Creatinine Clearance: 29.8 mL/min (A) (by C-G formula based on SCr of 2.37 mg/dL (H)).  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 Heparin  per pharmacy consult placed for hx of PE on apixaban  unable to take PO. Patient is on apixaban  prior to arrival. Last dose 9am 11/10. Will require aPTT monitoring due to likely falsely high anti-Xa level secondary to DOAC use. Hgb 13.9; plt 460  aPTT remains therapeutic at 66s with heparin  running at 1,200 units/hour. No signs of bleeding or issues with the heparin  infusion noted.    Goal of Therapy:  Heparin  level 0.3-0.7 units/ml aPTT 66-102 seconds Monitor platelets by anticoagulation protocol: Yes   Plan:  Continue heparin  infusion at 1200 units/hr Continue to monitor via aPTT until levels are correlated Continue to monitor H&H and platelets  Massie Fila, PharmD Clinical Pharmacist  09/22/2024 2:59 PM

## 2024-09-22 NOTE — Assessment & Plan Note (Addendum)
-   Urology consulted, appreciate recommendations  - Continue ongoing volume resuscitation with IV fluids - Hematuria will probably continue since patient is on heparin  gtt - Trend hemoglobin; bleeding from urinary source very mild and most of hemoglobin drop is attributable to hemodilution - General Surgery consult to follow-up for increase in abdominal gas foci - General Surgery recommendations  - Operative abdominal wall I&D and exploration for Wednesday or Thursday evening or late Friday morning  - Begin loperamide  and FiberCon to help calm diarrhea and bulk stool - Antibiotics - received Rocephin  IV once in ED --> consider transition to Zosyn , awaiting recommendations from urology and general surgery and holding abx for now - Blood cultures no growth less than 12 hours - Continue pain control with pain control: Tylenol  1000 mg Q6H PRN, Oxycodone  5-10 mg Q6H PRN - Nausea control with PRN v compazine   - AM Labs: CBC, BMP - Fall precautions - Daily weight - Dietitian consult - Delirium precautions - PT/OT consult, appreciate recommendations

## 2024-09-22 NOTE — Hospital Course (Addendum)
 Kristopher Thompson is a 73 y.o. year old with extensive past medical history as outlined below who presented with nausea and vomiting and was admitted to the [facility] Medicine Teaching Service for recurrent abdominal abscess.  Abdominal wall abscess Inability to tolerate diet Kristopher Thompson presented to the ED with nausea, vomiting, weakness, abdominal pain, and failure to thrive.  He was found to have electrolyte derangements of hyperkalemia and hypercalcemia.  In the ED, he was given IV fluids, and IV Rocephin , and admitted for further workup.  Urology and general surgery specialties were consulted.  The patient was placed on Zosyn  during hospital stay. Hospital course complicated by hypotension despite fluid resuscitation and pharmacological intervention with need for ICU transfer 11/12. He had an abdominal I&D washout by surgery 11/13.  Urine cultures were positive for E. coli.  With gradual improvement over hospital course, the patient qualified for LTAC and was transferred on 11/26.  PICC line was placed at discharge for 6 weeks of 1 L IVF daily per general surgery.  Orthostatic hypotension Chronic, on home midodrine  10mg  TID.  Patient continued to exhibit low blood pressures despite home midodrine  and IVF resuscitation.  Home midodrine  was increased from 10 mg 3 times daily to 15 mg 3 times daily and patient was given additional LR boluses as needed.  He was transferred to the ICU for persistent hypotension on 11/12 for IV Levophed .  Hypercalcemia Corrected serum calcium  14.0 on admission.  The patient was given 2 doses of subcutaneous calcitonin with improvement.  Workup for etiology included: PTH, PTH RP, vitamin D , TSH, T3/T4, thyroid  ultrasound, a.m. plasma cortisol, multiple myeloma panel.  Protein-calorie malnutrition, severe Secondary to chronic illnesses.  Extra calories added per dietitian including daily Juven twice daily, and Magic cup 3 times daily.  The patient was encouraged to eat a  regular diet as tolerated.  Delayed wound healing Midline abdominal wound s/p bowel perforation with open abdomen.  Wound care consulted and this was without complication throughout hospital course.  Other chronic conditions were medically managed with home medications and formulary alternatives as necessary (Pertinent past medical history includes bladder cancer status post cystectomy with ileal conduit in 2018, h/o prostate cancer 2013 s/p prostatectomy, recent admission 05-05/2024 for incarcerated incisional, parastomal, and inguinal hernias with subsequent ileostomy, urostomy with ileal conduit revision, complicated by bowel perforation and septic shock requiring pressors and AKI requiring HD as well as urinary leak from conduit and b/l nephrostomy tube placement, 06/2024 admission for severe sepsis 2/2 abdominal wall infection, pyelonephritis and AKI with JP drain cultures +E coli and cornybacterium s/p removal 8/21, urine cultures +MRSA and E faecium, DM2, OSA, HTN, HFpEF CAD, RBBB, h/o PE on eliquis .).  PCP Follow-up Recommendations: 6 months out from PE, discuss whether or not patient needs to stay on Eliquis  or needs to come down to 2.5mg  BID Orthostatic hypotension follow-up Electrolyte derangement Wound follow-ups Surgical follow-up

## 2024-09-22 NOTE — TOC CM/SW Note (Signed)
 Transition of Care [facility]) - Inpatient Brief Assessment   Patient Details  Name: Kristopher Thompson MRN: [id number] Date of Birth: 28-Dec-1950  Transition [facility]) CM/SW Contact:    [name], Harvest Muskrat, RN Phone Number: 09/22/2024, 12:45 PM   Clinical Narrative:  Patient presented to the ED with N/V, General Weakness, Abdominal pain, Loss of Appetite and Failure to Thrive. Patient with recent admissions. Has hx of Bladder Cancer s/p Cystectomy, Ileal Conduit, Prostate cancer s/p Prostatectomy, Incarcerated Inguinal Hernia, Sepsis, Abdominal Wall Infection, Pyelonephritis, AKI, DM2, OSA, HFpEF, RBBB, PE on Eliquis .  Urology following for mild increase in Rt Hydronephrosis, increased Gas and Rt Abdominal Wall Fluid collection with Cutaneous drainage at Rt Percutaneous Nephrostomy tube site. Patient currently on Heparin  gtt.   CM spoke with patient's wife, Kristopher Thompson at bedside about needs for post hospital transition. Patient is from home with his wife, son, daughter in-law and grand daughter. Has four supportive children. Kristopher Thompson states patient has been declining since May and needs more assistance at this time. Patient is currently active with [facility] health disciplines and they supply his Ostomy supplies. Has a cane, walker, BSC and w/c at home. Wheelchair is at bedside.  PCP is [name], MD and uses [facility] in [place].    Patient not Medically ready for discharge. Home health resumption of care order will be paced when patient is Medically ready.  CM will continue to follow as patient progresses with care towards discharge.      Transition of Care Asessment: Insurance and Status: Insurance coverage has been reviewed Patient has primary care physician: Yes Home environment has been reviewed: Yes Prior level of function:: Modified Independent Prior/Current Home Services: Current home services (Home health with [facility]) Social Drivers of Health  Review: SDOH reviewed no interventions necessary Readmission risk has been reviewed: Yes Transition of care needs: transition of care needs identified, TOC will continue to follow

## 2024-09-22 NOTE — Plan of Care (Signed)
 FMTS Interim Progress Note  Patient seen at bedside with Dr Lupie to assess stability.   S: Patient reports doing alright. Reports nausea, unable to eat breakfast earlier. Has some lower abdominal pain and IV related pain, otherwise no pain.    O: BP 102/61 (BP Location: Right Arm)   Pulse 82   Temp 97.8 F (36.6 C) (Oral)   Resp 20   Ht 5' 11 (1.803 m)   Wt 74.8 kg   SpO2 100%   BMI 23.01 kg/m   General: Resting comfortably in room. CV: Normal S1/S2.  Pulm: Breathing comfortably on room air. CTAB. No increased WOB. Abd: Soft, non-tender, non-distended. Skin:  Warm, dry.  A/P: Patient appears stable at this time. Plan for IV Compazine  and re-attempt breakfast later.   Diona Perkins, MD 09/22/2024, 9:28 AM PGY-2, [facility] Service pager [phone]

## 2024-09-22 NOTE — Consult Note (Signed)
 WOC Nurse Consult Note: Reason for Consult: midline wound Wound type: surgical  Pressure Injury POA: NA Measurement: see nursing flow sheets Wound bed:100% pale, chronic  Drainage (amount, consistency, odor) see nursing flow sheets Periwound: intact Dressing procedure/placement/frequency: Cleanse midline wound with saline, pat dry Apply hydrogel to wound bed Soila # [phone]) and cover with dry dressing.  Change daily   WOC Nurse ostomy consult note Stoma type/location: ileostomy and urostomy  Peristomal assessment: NA Treatment options for stomal/peristomal skin: 2 barrier ring to each stoma  Output see nursing flow sheets Ostomy pouching: 1pc.convex pouches used on both ileostomy and urostomy Education provided: NA, patient and family independent with ostomy care Enrolled patient in [employer] DC program: Yes Please order the patient the following  Ostomy pouching: 1pc convex urostomy pouch Soila [phone]) with 2 barrier rings Soila # [phone])- order 5 of each 1pc soft convex ileostomy Soila # [phone]) (5 each), ostomy powder (lawson # 6) (1 each),  urostomy drainage bag adapter (lawson # [id number]) (1 each).  Will need foley drainage bag to hook urostomy pouch to for constant draining.   Re consult if needed, will not follow at this time. Thanks  [employer]ole [employer], RN,CWOCN, CNS, [employer] [phone]

## 2024-09-22 NOTE — Assessment & Plan Note (Addendum)
 Midline abdominal wound s/p bowel perforation with open abdomen.  Well-appearing without erythema or drainage with clean bandage on top. - Wound care consult consulted, appreciate recs - Bandage change and wound care has signed off at this time.

## 2024-09-22 NOTE — Evaluation (Signed)
 Physical Therapy Evaluation Patient Details Name: Kristopher Thompson MRN: [id number] DOB: Oct 23, 1951 Today's Date: 09/22/2024  History of Present Illness  73 y.o. male presenting to [facility] with complaint of nausea/vomiting, weakness, abdominal pain, and failure to thrive; concern for fistula near urostomy and slight increase in right hydronephrosis; PMHx: Admit 5/30-7/11 for hernia repair with bowel perforation and DC to snf, readmit 7/14-7/16 with D/C to CIR 7/16-8/8. HTN, dCHF, CAD, DM2, PE on Eliquis , anemia, obesity, prostate and bladder CA, hernia repair, urostomy, colostomy, RBBB  Clinical Impression   Pt admitted with above diagnosis. Lives at home with wife, in a single-level home with a few steps to enter; Prior to admission, pt was able to manage walking household distances with RW; Presents to PT with generalized weakness, decr endurance;  Overall min assist to get to EOB and stand to RW; Became dizzy and tachypneic with incr time standing, and had to sit; low BP read takien in sitting after trial of standing; see vitals flowsheets; Pt currently with functional limitations due to the deficits listed below (see PT Problem List). Pt will benefit from skilled PT to increase their independence and safety with mobility to allow discharge to the venue listed below.    Hopeful for solid recoveray as pt becomes medically better; if slow progress, we may need to consider another rehab stay, but pt and wife are hopeful for home;     09/22/24 1045 09/22/24 1056 09/22/24 1057  Vital Signs  Patient Position (if appropriate) Orthostatic Vitals  --   --   Orthostatic Lying   BP- Lying 103/66  --   --   Pulse- Lying 102  --   --   Orthostatic Sitting  BP- Sitting 113/65 (!) 87/77 112/66  Pulse- Sitting 122 122 (taken just after pt had to sit down) 107 (in recliner, feet up)  Orthostatic Standing at 0 minutes  BP- Standing at 0 minutes  --   (Attempted; pt unable to satnd long  enough)  --    Will consider TED hose and orthostatics next session         If plan is discharge home, recommend the following: A little help with walking and/or transfers;A little help with bathing/dressing/bathroom;Assist for transportation;Assistance with cooking/housework   Can travel by private vehicle        Equipment Recommendations None recommended by PT (Well-equipped)  Recommendations for Other Services  OT consult    Functional Status Assessment Patient has had a recent decline in their functional status and demonstrates the ability to make significant improvements in function in a reasonable and predictable amount of time.     Precautions / Restrictions Precautions Precautions: Fall;Other (comment) Precaution/Restrictions Comments: BPs soft, and with a drop in standing; Multiple tubes an dlines; L nephrostomy, RUQ ileostomy, urostomy Restrictions Weight Bearing Restrictions Per Provider Order: No      Mobility  Bed Mobility Overal bed mobility: Needs Assistance Bed Mobility: Supine to Sit     Supine to sit: Min assist     General bed mobility comments: min handheld assist to pull to sit    Transfers Overall transfer level: Needs assistance Equipment used: Rolling walker (2 wheels) Transfers: Sit to/from Stand, Bed to chair/wheelchair/BSC Sit to Stand: Min assist, +2 safety/equipment   Step pivot transfers: Min assist, +2 safety/equipment       General transfer comment: Overall steady with stading to RW adn using RW fro support taking pivot steps bed to recliner; dizziness incr with incr time  sanding    Ambulation/Gait                  Stairs            Wheelchair Mobility     Tilt Bed    Modified Rankin (Stroke Patients Only)       Balance Overall balance assessment: Mild deficits observed, not formally tested                                           Pertinent Vitals/Pain Pain Assessment Pain  Assessment: Faces Faces Pain Scale: Hurts little more Pain Location: L nephrostomy tube Pain Descriptors / Indicators: Grimacing, Guarding Pain Intervention(s): Monitored during session, Repositioned    Home Living Family/patient expects to be discharged to:: Private residence Living Arrangements: Spouse/significant other Available Help at Discharge: Family;Available 24 hours/day Type of Home: House Home Access: Stairs to enter (coming in from the garage 2 stairs with rails both side) Entrance Stairs-Rails: Can reach both Entrance Stairs-Number of Steps: 2 Alternate Level Stairs-Number of Steps: 14-16 stairs Home Layout: Two level;Able to live on main level with bedroom/bathroom Home Equipment: Rollator (4 wheels);Rolling Walker (2 wheels) Additional Comments: taken per chart review and patient    Prior Function Prior Level of Function : Independent/Modified Independent             Mobility Comments: mod i RW, got weak after a couple of days at home then needed help ADLs Comments: pt was bathing himself, sponge bathing, had assist wtih dressing.     Extremity/Trunk Assessment   Upper Extremity Assessment Upper Extremity Assessment: Defer to OT evaluation    Lower Extremity Assessment Lower Extremity Assessment: Generalized weakness (notably decr muscle endurance)    Cervical / Trunk Assessment Cervical / Trunk Assessment: Other exceptions Cervical / Trunk Exceptions: multiple drains  Communication   Communication Communication: Impaired Factors Affecting Communication: Hearing impaired    Cognition Arousal: Alert Behavior During Therapy: WFL for tasks assessed/performed   PT - Cognitive impairments: No apparent impairments                         Following commands: Intact       Cueing Cueing Techniques: Verbal cues     General Comments General comments (skin integrity, edema, etc.): noting incr RR adn dizziness in standing    Exercises      Assessment/Plan    PT Assessment Patient needs continued PT services  PT Problem List Decreased strength;Decreased activity tolerance;Decreased balance;Decreased mobility;Decreased knowledge of use of DME;Decreased safety awareness;Decreased knowledge of precautions;Cardiopulmonary status limiting activity;Pain       PT Treatment Interventions DME instruction;Gait training;Stair training;Functional mobility training;Therapeutic activities;Therapeutic exercise;Balance training;Neuromuscular re-education;Cognitive remediation;Patient/family education;Wheelchair mobility training;Manual techniques    PT Goals (Current goals can be found in the Care Plan section)  Acute Rehab PT Goals Patient Stated Goal: to get home and not get readmitted PT Goal Formulation: With patient Time For Goal Achievement: 10/06/24 Potential to Achieve Goals: Good    Frequency Min 2X/week     Co-evaluation               AM-PAC PT 6 Clicks Mobility  Outcome Measure Help needed turning from your back to your side while in a flat bed without using bedrails?: None Help needed moving from lying on your back to sitting on the side of  a flat bed without using bedrails?: A Little Help needed moving to and from a bed to a chair (including a wheelchair)?: A Little Help needed standing up from a chair using your arms (e.g., wheelchair or bedside chair)?: A Little Help needed to walk in hospital room?: Total Help needed climbing 3-5 steps with a railing? : Total 6 Click Score: 15    End of Session Equipment Utilized During Treatment: Gait belt Activity Tolerance: Patient tolerated treatment well (though with BP drop in standing) Patient left: in chair;with call bell/phone within reach;with chair alarm set Nurse Communication: Mobility status;Other (comment) (and BP drop in standing) PT Visit Diagnosis: Unsteadiness on feet (R26.81);Other abnormalities of gait and mobility (R26.89);Dizziness and giddiness  (R42);Difficulty in walking, not elsewhere classified (R26.2)    Time: 8971-8940 PT Time Calculation (min) (ACUTE ONLY): 31 min   Charges:   PT Evaluation $PT Eval Moderate Complexity: 1 Mod PT Treatments $Therapeutic Activity: 8-22 mins PT General Charges $$ ACUTE PT VISIT: 1 Visit         Silvano Currier, PT  Acute Rehabilitation Services Office [phone] Secure Chat welcomed   [name] 09/22/2024, 3:17 PM

## 2024-09-22 NOTE — Progress Notes (Signed)
 Initial Nutrition Assessment  DOCUMENTATION CODES:   Severe malnutrition in context of chronic illness  INTERVENTION:  Daily multivitamin PO. 1 packet Juven BID to support wound healing. Each packet provides 95 calories, 2.5 grams of protein (collagen), and 9.8 grams of carbohydrate (3 grams sugar); also contains 7 grams of L-arginine and L-glutamine, 300 mg vitamin C, 15 mg vitamin E, 1.2 mcg vitamin B-12, 9.5 mg zinc, 200 mg calcium , and 1.5 g Calcium  Beta-hydroxy-Beta-methylbutyrate. Magic cup TID with meals, each supplement provides 290 kcal and 9 grams of protein Continue regular diet.   NUTRITION DIAGNOSIS:   Severe Malnutrition related to chronic illness as evidenced by energy intake < or equal to 75% for > or equal to 1 month, severe muscle depletion, moderate fat depletion, percent weight loss.   GOAL:   Patient will meet greater than or equal to 90% of their needs   MONITOR:   PO intake, Supplement acceptance, Labs, Weight trends, Skin  REASON FOR ASSESSMENT:   Consult Assessment of nutrition requirement/status, Poor PO, Wound healing  ASSESSMENT:   Patient presented with N/V/abdominal pain, weakness and FTT and was found to have chronic abdominal wall/fistula infection with I&D and exploration pending, and delayed wound healing. PMH significant for recent admission 07/2024 for debility and abdominal wall drainage, HFpEF, CAD, T2DM, PE, prostate cancer, bladder cancer s/p cystoprostatectomy and ileal conduit (2013), recent admission 04/10/24 for parastomal-incisional abdominal wall hernia repair w/graft and ileal conduit reversal c/b intra-abdominal and abdominal wall abscess from bowel perforation s/p end ileostomy.   Recent Admissions: 5/30-7/11: bowel perforation s/p SBR/end ileostomy 7/14-7/16: AKI, infection of ileostomy site 7/16-8/8: Admitted to CIR 8/20-8/30: Admitted to [facility]: AKI, severe sepsis 9/3 - MBS, Dys 3/thin 9/14-9/15 - calorie count = not meeting  needs 9/17 - Cortrak placed 9/22-9/24 calorie count with improved intake of approx 50% needs 9/24 Cortrak removed   Visited the patient who was asleep during most of the visit. His wife Marval at bedside reports that the patient's PO intake had improved after last admission in September up until 2 days prior to this admission when he started experiencing nausea. She confirms that he has had more weight loss since last admission. The patient reports that his nausea has improved but he is only managing to eat some cheerios and drinking barely one CorePower drink daily that Marval brings in. His ileostomy output had been formed up until he could not take his bowel care meds due to nausea. He dislikes Ensure and other ONS but is amenable to [employer] and Juven.  Typical day's intake: Breakfast - cereal Lunch - ham sandwich Dinner - vegetable beef soup/stew, or pizza  Scheduled Meds:  ferrous sulfate   325 mg Oral BID WC   loperamide   4 mg Oral BID   melatonin  3 mg Oral QHS   midodrine   10 mg Oral TID WC   polycarbophil  625 mg Oral BID   rosuvastatin   10 mg Oral QPM   sertraline   50 mg Oral Daily   cyanocobalamin   500 mcg Oral Q M,W,F   Continuous Infusions:  sodium chloride  125 mL/hr at 09/22/24 0915   heparin  1,200 Units/hr (09/21/24 2154)   piperacillin -tazobactam (ZOSYN )  IV     sodium chloride  1,000 mL (09/22/24 1423)   Diet Order             Diet NPO time specified Except for: Sips with Meds, Ice Chips  Diet effective 0500 tomorrow  Diet clear liquid Room service appropriate? Yes; Fluid consistency: Thin  Diet effective midnight           Diet regular Room service appropriate? Yes; Fluid consistency: Thin  Diet effective now                  Meal Intake: Minimal per wife  Labs:     Latest Ref Rng & Units 09/22/2024    4:42 AM 09/21/2024    3:26 PM 09/21/2024   12:32 PM  CMP  Glucose 70 - 99 mg/dL 890  886  877   BUN 8 - 23 mg/dL 46  60  49    Creatinine 0.61 - 1.24 mg/dL 7.62  7.19  7.46   Sodium 135 - 145 mmol/L 134  130  132   Potassium 3.5 - 5.1 mmol/L 4.4  5.2  5.2   Chloride 98 - 111 mmol/L 105  104  97   CO2 22 - 32 mmol/L 16   20   Calcium  8.9 - 10.3 mg/dL 88.3   85.7   Total Protein 6.5 - 8.1 g/dL 6.6   8.8   Total Bilirubin 0.0 - 1.2 mg/dL 0.6   0.5   Alkaline Phos 38 - 126 U/L 79   119   AST 15 - 41 U/L 19   26   ALT 0 - 44 U/L 18   25     I/O: +300 mL since admit  NUTRITION - FOCUSED PHYSICAL EXAM:  Flowsheet Row Most Recent Value  Orbital Region Mild depletion  Upper Arm Region Mild depletion  Thoracic and Lumbar Region Mild depletion  Buccal Region Moderate depletion  Temple Region Severe depletion  Clavicle Bone Region Moderate depletion  Clavicle and Acromion Bone Region Moderate depletion  Scapular Bone Region Moderate depletion  Dorsal Hand Moderate depletion  Patellar Region Severe depletion  Anterior Thigh Region Severe depletion  Posterior Calf Region Severe depletion  Edema (RD Assessment) None  Hair Reviewed  Eyes Reviewed  Mouth Reviewed  Skin Reviewed  Nails Reviewed    EDUCATION NEEDS:   Education needs have been addressed  Skin:  Skin Assessment: Skin Integrity Issues: Skin Integrity Issues:: Incisions Incisions: Flank  Last BM:  11/11 200 mL ileostomy ouput  Height:   Ht Readings from Last 1 Encounters:  09/21/24 5' 11 (1.803 m)    Weight:    Weight Change: 15 Kg (17%) loss in 3 months  Edema: none charted  Ideal Body Weight:  78 kg   BMI:  Body mass index is 23.01 kg/m.  Estimated Nutritional Needs:  Kcal:  2000-2300 Protein:  120-140 Fluid:  >1800    Leverne Ruth, MS, RDN, LDN [place]. [facility] See AMION for contact information

## 2024-09-22 NOTE — Consult Note (Signed)
 Urology Consult Note   Requesting Attending Physician:  [name], DO Service Providing Consult: Urology  Consulting Attending: Dr. Renda   Reason for Consult:  concern for fistula near urostomy and slight increase in right hydronephrosis  HPI: Kristopher Thompson is seen in consultation for reasons noted above at the request of [name], DO. Patient is a 73 y.o. male presenting to [facility] with complaint of nausea/vomiting, weakness, abdominal pain, and failure to thrive. PMH significant for bladder cancer, CHF, RBBB, HTN, T2DM, CAD, and acute bowel perforation.  He has had extensive and complicated bowel surgeries and treatment of parastomal and incisional incarcerated abdominal wall hernias.  These have included extensive robotic lysis of adhesions , transversus abdominis release bilaterally, robotic repair of incisional, parastomal, and left inguinal incarcerated abdominal wall hernias with mesh, and urostomy ileal conduit revision complicated by septic shock and subsequent HD.  PMH also significant for T2DM, OSA, HTN, HFpEF, CAD, RBBB, PE on Eliquis , and ongoing abdominal wall infection.  He was recently admitted to the hospital and later inpatient rehabilitation from 07/11/2024 through 08/14/2024.  During his stay he was followed by infectious disease where ongoing antibiotic treatment showed no real change in right ventral wall pelvic collection, as well as no signs of developing sepsis or systemic infectious process.  General surgery felt abdominal mesh infection less likely.  60-month antibiotic regimen ongoing  [facility] was consulted to speak to mild increase in right hydronephrosis as well as increased gas and right abdominal wall fluid collection with report of cutaneous drainage at right percutaneous nephrostomy tube site.  To clarify after reviewing with wife she said that she has seen no drainage from the PCNT site, she is seeing drainage on right  abdomen.  ------------------  Assessment:   73 y.o. male with right hydronephrosis and ongoing abdominal wall abscess.   Recommendations:  # Right hydronephrosis Right percutaneous nephrostomy tube was recently removed.  Right stent appears in place with improving AoCKD.  Trend labs. Agree with ongoing volume resuscitation  # Left ureteral obstruction Significantly atrophic kidney is probably barely functioning.  Ongoing left PCNT primarily to keep properly decompressed and from perpetuating a septic picture and this fragile patient.  #hematuria Ongoing mild gross hematuria without clot accumulation from urostomy.  Wife reports that this has been ongoing since most recent stent placement on Eliquis .  As patient is presently on heparin  gtt., this will probably be ongoing until he can tolerate at least a few days of anticoagulant holiday.  Trend Hgb.  13.1 yesterday, 13.9 (?) on recheck that afternoon, now 11.2.  Bleeding from urinary source is very mild and most of Hgb drop is attributable to hemodilution.    # Right abdominal fluid collection Some increase in gas foci.  Plans for follow-up with general surgery today for reassessment/I&D.  General surgery to see.  No acute surgical indications from a urologic perspective.  Urology to see as needed  Case and plan discussed with Dr. Renda  Past 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   moderate atypia - right low back   Atypical nevus 04/04/2007   moderate to marked - right upper back (wider shave)   Atypical nevus 04/04/2007   moderate atypia - center chest (wider shave)   Atypical nevus 04/04/2007   slight atypia -  right thigh   Atypical nevus 11/29/2011   mild atypia - center upper back   Atypical nevus 11/29/2011   mild atypia - center chest   Bacteremia due to Klebsiella pneumoniae 10/09/2017    Bladder cancer (HCC) dx 07/2017   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  s/p  cystectomy w/ ileal conduit urinary diversion   Candida infection    CHF (congestive heart failure) (HCC)    Colostomy in place [facility])    since 08-08-2017-- per pt 12-25-2017 reddness around stoma   Diabetes mellitus without complication (HCC)    GERD (gastroesophageal reflux disease)    H/O hiatal hernia    History of sepsis 09/2017   dx bacteremia due to klebsiella pneumoniae,  post op intraabdominal abscess   Prostate cancer [facility]) urologist-- dr renda   10-02-2012 s/p  prostatectomy-- Stage T1c   RBBB    Renal disorder    pt. denies   Sleep apnea    cpap   Squamous cell carcinoma of skin 05/22/2013   left cheek - CX3 + 5FU   Wears glasses     Past Surgical History:  Past Surgical History:  Procedure Laterality Date   ABDOMINAL SURGERY     APPENDECTOMY  1972   BOWEL RESECTION N/A 04/20/2024   Procedure: SMALL BOWEL RESECTION;  Surgeon: [name], MD;  Location: [facility];  Service: General;  Laterality: N/A;   CHOLECYSTECTOMY  1985   COLONOSCOPY N/A 06/29/2021   Procedure: COLONOSCOPY;  Surgeon: Debby Hila, MD;  Location: [facility];  Service: Endoscopy;  Laterality: N/A;   COLOSTOMY REVERSAL N/A 01/08/2018   Procedure: COLOSTOMY REVERSAL;  Surgeon: Debby Hila, MD;  Location: [facility];  Service: General;  Laterality: N/A;   CYSTOSCOPY WITH RETROGRADE PYELOGRAM, URETEROSCOPY AND STENT PLACEMENT Right 06/10/2017   Procedure: CYSTOSCOPY WITH RIGHT URETEROSCOPY WITH RIGHT STENT PLACEMENT;  Surgeon: [name], MD;  Location: [facility];  Service: Urology;  Laterality: Right;   EUS N/A 04/16/2018   Procedure: FULL UPPER ENDOSCOPIC ULTRASOUND (EUS) RADIAL;  Surgeon: Burnette Fallow, MD;  [facility];  Service: Endoscopy;  Laterality: N/A;   FLEXIBLE SIGMOIDOSCOPY N/A 12/13/2017   Procedure: FLEXIBLE SIGMOIDOSCOPY;  Surgeon: Debby Hila, MD;  [facility];  Service: Endoscopy;   Laterality: N/A;   ILEO CONDUIT     IR CATHETER TUBE CHANGE  12/13/2017   IR CATHETER TUBE CHANGE  01/17/2018   IR CATHETER TUBE CHANGE  02/28/2018   IR CATHETER TUBE CHANGE  07/18/2018   IR CATHETER TUBE CHANGE  08/22/2018   IR CATHETER TUBE CHANGE  10/31/2018   IR CONVERT LEFT NEPHROSTOMY TO NEPHROURETERAL CATH  10/24/2017   IR EXT NEPHROURETERAL CATH EXCHANGE  05/19/2018   IR EXT NEPHROURETERAL CATH EXCHANGE  06/16/2018   IR EXT NEPHROURETERAL CATH EXCHANGE  07/09/2024   IR NEPHRO TUBE REMOV/FL  10/24/2017   IR NEPHRO TUBE REMOV/FL  08/31/2024   IR NEPHROSTOGRAM LEFT THRU EXISTING ACCESS  12/05/2018   IR NEPHROSTOGRAM RIGHT THRU EXISTING ACCESS  08/31/2024   IR NEPHROSTOMY EXCHANGE LEFT  05/11/2024   IR NEPHROSTOMY EXCHANGE LEFT  07/09/2024   IR NEPHROSTOMY EXCHANGE LEFT  08/31/2024   IR NEPHROSTOMY PLACEMENT LEFT  10/07/2017   IR NEPHROSTOMY PLACEMENT RIGHT  05/04/2024   IR NEPHROSTOMY TUBE CHANGE  04/11/2018   IR URETERAL STENT PLACEMENT EXISTING ACCESS LEFT  05/13/2024   IR URETERAL STENT RIGHT NEW ACCESS W/SEP NEPHROSTOMY CATH  05/13/2024   LAPAROTOMY N/A 04/20/2024   Procedure: EXPLORATORY LAPAROTOMY;  Surgeon:  [name], MD;  Location: [facility];  Service: General;  Laterality: N/A;   PARTIAL COLECTOMY N/A 04/21/2024   Procedure: COLECTOMY, PARTIAL;  Surgeon: [name], MD;  Location: [facility];  Service: General;  Laterality: N/A;  Removal Wound Vac, Washout Ostomy, Possible Anastomosis, Possible Ileostomy.  Phasix Mesh.   POLYPECTOMY  06/29/2021   Procedure: POLYPECTOMY;  Surgeon: Debby Hila, MD;  [facility];  Service: Endoscopy;;   ROBOT ASSISTED LAPAROSCOPIC RADICAL PROSTATECTOMY  10/02/2012   Procedure: ROBOTIC ASSISTED LAPAROSCOPIC RADICAL PROSTATECTOMY LEVEL 2;  Surgeon: Noretta Ferrara, MD;  Location: [facility];  Service: Urology;  Laterality: N/A;   ROBOTIC ASSISTED LAPAROSCOPIC BLADDER DIVERTICULECTOMY N/A 08/08/2017   Procedure: XI ROBOTIC ASSISTED LAPAROSCOPIC RADICAL CYSTECTOMY  COVERTED TO OPEN PELVIC LYMPHADNECTOMY BILATERAL AND ILEAL CONDUIT URINARY DIVERSION;  Surgeon: Ferrara Glance, MD;  Location: [facility];  Service: Urology;  Laterality: N/A;   TRANSURETHRAL RESECTION OF BLADDER TUMOR  06/10/2017   Procedure: TRANSURETHRAL RESECTION OF BLADDER TUMOR (TURBT);  Surgeon: Ferrara Glance, MD;  Location: [facility];  Service: Urology;;   VACUUM ASSISTED CLOSURE CHANGE N/A 04/20/2024   Procedure: PLACEMENT OF CONCETTA CAPES;  Surgeon: [name], MD;  Location: [facility];  Service: General;  Laterality: N/A;   VENTRAL HERNIA REPAIR N/A 04/10/2024   Procedure: REPAIR, HERNIA, VENTRAL;  Surgeon: [name], MD;  Location: [facility];  Service: General;  Laterality: N/A;   XI ROBOT ABDOMINAL PERINEAL RESECTION N/A 08/08/2017   Procedure: REPAIR OF RECTAL TEAR POSSIBLE PARTIAL PROCTECTOMY, CREATION OF  OSTOMY;  Surgeon: Debby Hila, MD;  Location: [facility];  Service: General;  Laterality: N/A;   XI ROBOTIC ASSISTED PARASTOMAL HERNIA REPAIR N/A 04/10/2024   Procedure: REPAIR, HERNIA, PARASTOMAL AND VENTRAL HERNIAS, ROBOT-ASSISTED, LEFT INGUINAL HERNIA, LYSIS OF ADHESIONS INCARCERATED AND INSIONAL HERNIAS AND UROSTOMY REVISION;  Surgeon: [name], MD;  Location: [facility];  Service: General;  Laterality: N/A;    Medication: Current Facility-Administered Medications  Medication Dose Route Frequency Provider Last Rate Last Admin   0.9 %  sodium chloride  infusion   Intravenous Continuous Howell Lunger, DO 125 mL/hr at 09/22/24 0915 New Bag at 09/22/24 0915   acetaminophen  (TYLENOL ) tablet 325-650 mg  325-650 mg Oral Q6H PRN Suknaim, Kulkaew B, DO       heparin  ADULT infusion 100 units/mL (25000 units/250mL)  1,200 Units/hr Intravenous Continuous Rumball, Alison M, DO 12 mL/hr at 09/21/24 2154 1,200 Units/hr at 09/21/24 2154   hydrOXYzine  (ATARAX ) tablet 25 mg  25 mg Oral TID PRN Suknaim, Kulkaew B, DO       ipratropium (ATROVENT ) 0.06 % nasal spray 1 spray  1 spray Each Nare Q8H PRN Suknaim,  Kulkaew B, DO       loperamide  (IMODIUM ) capsule 4 mg  4 mg Oral TID Suknaim, Kulkaew B, DO   4 mg at 09/22/24 0917   melatonin tablet 3 mg  3 mg Oral QHS Suknaim, Kulkaew B, DO   3 mg at 09/22/24 0011   midodrine  (PROAMATINE ) tablet 10 mg  10 mg Oral TID WC Suknaim, Kulkaew B, DO   10 mg at 09/22/24 0917   polycarbophil (FIBERCON) tablet 625 mg  625 mg Oral BID Suknaim, Kulkaew B, DO       prochlorperazine  (COMPAZINE ) injection 10 mg  10 mg Intravenous Q6H PRN Cleotilde Perkins, DO   10 mg at 09/22/24 9071   rosuvastatin  (CRESTOR ) tablet 10 mg  10 mg Oral QPM Suknaim, Kulkaew B, DO       sertraline  (ZOLOFT )  tablet 50 mg  50 mg Oral Daily Suknaim, Kulkaew B, DO   50 mg at 09/22/24 9082   sodium chloride  (OCEAN) 0.65 % nasal spray 1-2 spray  1-2 spray Each Nare Q6H PRN Suknaim, Kulkaew B, DO       vitamin B-12 (CYANOCOBALAMIN ) tablet 500 mcg  500 mcg Oral Q M,W,F Suknaim, Kulkaew B, DO        Allergies: Allergies  Allergen Reactions   Demerol  [Meperidine ] Nausea And Vomiting    Social History: Social History   Tobacco Use   Smoking status: Former    Current packs/day: 0.00    Types: Cigarettes    Start date: 12/24/1970    Quit date: 12/25/1979    Years since quitting: 44.7   Smokeless tobacco: Never  Vaping Use   Vaping status: Never Used  Substance Use Topics   Alcohol  use: Yes    Comment: rare   Drug use: No    Family History Family History  Problem Relation Age of Onset   Lung cancer Mother    Hypertension Father    Colon cancer Other    CAD Neg Hx    Diabetes Neg Hx    Stroke Neg Hx     Review of Systems  Unable to perform ROS: Other (sleeping)  Genitourinary:  Positive for hematuria.     Objective   Vital signs in last 24 hours: BP 102/61 (BP Location: Right Arm)   Pulse 82   Temp 97.8 F (36.6 C) (Oral)   Resp 20   Ht 5' 11 (1.803 m)   Wt 74.8 kg   SpO2 100%   BMI 23.01 kg/m   Physical Exam General: Sleeping HEENT: [place]/AT Pulmonary: Normal work of  breathing Cardiovascular: no cyanosis Abdomen: Urostomy draining light red urine, no clot material.  Stent in place and visible.  Copious mucus covering ostomy site GU: Left percutaneous nephrostomy tube, low output at baseline.   Most Recent Labs: Lab Results  Component Value Date   WBC 10.5 09/22/2024   HGB 11.2 (L) 09/22/2024   HCT 34.2 (L) 09/22/2024   PLT 332 09/22/2024    Lab Results  Component Value Date   NA 134 (L) 09/22/2024   K 4.4 09/22/2024   CL 105 09/22/2024   CO2 16 (L) 09/22/2024   BUN 46 (H) 09/22/2024   CREATININE 2.37 (H) 09/22/2024   CALCIUM  11.6 (H) 09/22/2024   MG 2.5 (H) 09/21/2024   PHOS 5.0 (H) 08/13/2024    Lab Results  Component Value Date   INR 1.3 (H) 09/21/2024   APTT 75 (H) 09/22/2024     Urine Culture: @[id number] (laburin,org,[id number])@   IMAGING: CT CHEST ABDOMEN PELVIS WO CONTRAST Result Date: 09/21/2024 CLINICAL DATA:  Sepsis EXAM: CT CHEST, ABDOMEN AND PELVIS WITHOUT CONTRAST TECHNIQUE: Multidetector CT imaging of the chest, abdomen and pelvis was performed following the standard protocol without IV contrast. RADIATION DOSE REDUCTION: This exam was performed according to the departmental dose-optimization program which includes automated exposure control, adjustment of the mA and/or kV according to patient size and/or use of iterative reconstruction technique. COMPARISON:  CT 08/13/2024, 07/31/2024, 07/02/2024, and multiple prior exams FINDINGS: CT CHEST FINDINGS Cardiovascular: Limited evaluation without intravenous contrast. Moderate aortic atherosclerosis. No aneurysm. Multi vessel coronary vascular calcification. Normal cardiac size. No pericardial effusion Mediastinum/Nodes: Patent trachea. Stable appearance of thyroid  gland with nodularity along the inferior isthmus, no specific imaging follow-up recommended. No suspiciously enlarged lymph nodes. Small hiatal hernia Lungs/Pleura: No acute airspace disease, pleural effusion  or  pneumothorax. Stable scattered small pulmonary nodules. Punctate 1-2 mm adjacent nodules in the right apex, series 5, image 42. Left upper lobe 3 mm pulmonary nodule series 5, image 48. 3 mm right middle lobe pulmonary nodule series 5, image 99. 3 mm left lung base pulmonary nodule series 5, image 118. Musculoskeletal: Sternum appears intact. No acute osseous abnormality. Mild chronic superior endplate deformity at T7. CT ABDOMEN PELVIS FINDINGS Hepatobiliary: Cholecystectomy. No focal hepatic abnormality or biliary dilatation. Pancreas: Multiple calcifications consistent with chronic pancreatitis. Atrophy. No inflammation. Stable 12 mm hypodense lesion at the distal pancreas on series 3, image 58. Spleen: Normal in size without focal abnormality. Adrenals/Urinary Tract: Stable adrenal glands. 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 Urothelial thickening of left renal pelvis. Mildly prominent left ureter, also with urothelial thickening. No obstructing stone on the left. Right ureteral stent with proximal pigtail in the right renal collecting system and distal pigtail at the right abdominal urostomy. Right-sided nephrostomy has been removed. Residual mild to moderate right hydronephrosis, stable to minimally increased. Increased stranding at the right renal pelvis compared to prior. Few gas bubbles in the slightly dilated distal right ureter, series 3 image 93 and 95. Stomach/Bowel: Stomach within normal limits. No dilated small bowel. Enteral contrast within the colon. 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 Vascular/Lymphatic: Aortic atherosclerosis. No suspicious lymph nodes Reproductive: Prostatectomy Other: Negative for ascites. Prominent ventral scar. Redemonstrated heterogenous collection within the right abdominal wall near the urostomy.  Increased gas within the fluid collection compared to the most recent prior. This appears to communicate with a soft tissue tract in the right flank area, series 3, image 85 and coronal series 6, image 48 suggesting a fistula. Musculoskeletal: No acute osseous abnormality. Chronic superior endplate deformity at L1. IMPRESSION: 1. No CT evidence for acute intrathoracic abnormality. No acute airspace disease. 2. Status post cystoprostatectomy with right abdominal ileal conduit. Right ureteral stent in place with stable to minimally increased mild to moderate right hydronephrosis, post removal of right percutaneous nephrostomy. Slight increased stranding at the right renal pelvis and ureter compared to prior, with diffuse urothelial thickening of left renal pelvis and ureter, correlate for upper UTI. Left-sided percutaneous nephrostomy catheter remains in place and there is no left hydronephrosis. 3. Redemonstrated heterogenous collection within the right abdominal wall near the urostomy. Increased gas within the fluid collection compared to the most recent prior with additional finding of small gas within the ileal conduit and within the distal right ureter adjacent to the stent. The heterogenous abdominal wall collection also appears to communicate with the right lateral flank skin surface as may be seen with a fistula. Increased gas within the collection compared to the prior exam could be secondary to superimposed infection, other consideration could include fistula or leak related to the urostomy. 4. Other chronic findings as previously described 5. Aortic atherosclerosis. Aortic Atherosclerosis (ICD10-I70.0). Electronically Signed   By: Luke Bun M.D.   On: 09/21/2024 17:42   DG Chest 1 View Result Date: 09/21/2024 CLINICAL DATA:  Sepsis. EXAM: CHEST  1 VIEW COMPARISON:  Chest radiograph dated 08/04/2024. FINDINGS: No focal consolidation, pleural effusion, or pneumothorax. The cardiac silhouette is within  normal limits. No acute osseous pathology. IMPRESSION: No active disease. Electronically Signed   By: Vanetta Chou M.D.   On: 09/21/2024 13:22    ------  Ole Bourdon, NP Pager: [phone]  Please contact the urology consult pager with any further questions/concerns.

## 2024-09-22 NOTE — Assessment & Plan Note (Addendum)
-   Admit to [facility], attending Dr. Rumball  - Vital signs per floor - Consults: Urology - Fluids: IVF until tolerated PO - Antibiotics: received Rocephin  IV once in ED - Blood culture collected and pending result  - Pain control: Tylenol  1000 mg Q6H PRN, Oxycodone  5-10 mg Q6H PRN - Bowel Reg: MiraLax , SENNA - AM Labs: CBC, BMP - Fall precautions - Daily weight - Dietitian consult - Delirium precautions - PT/OT consult

## 2024-09-22 NOTE — Plan of Care (Signed)
 Called pharmacy to discuss Zoledronic Acid dosing with new AKI and CrCl <35. Patient with severe hypercalcemia. 14.2 on CMP, 14.0 on correction. Would also like to order Calcitonin for patient- but Zoledronic acid is typically given prior to or with Calcitonin.  After review by pharmacy and this physician, we have agreed it is likely appropriate to proceed with a one-time calcitonin dose of 4 U/kg subcutaneous without administering zoledronic acid.  Plan to recheck calcium  in the morning.  Once AKI resolves, and creatinine clearance improves can consider zoledronic acid at that time.  PTH and ionized calcium  collected prior to administration of calcitonin.  We will follow these labs, and continue workup as appropriate pending the results.

## 2024-09-22 NOTE — Assessment & Plan Note (Addendum)
 Corrected serum calcium  12.5 today, will begin workup for etiology of hypercalcemia with the following labs: - PTH  - PTH RP -TSH - Vitamin D  - AM plasma cortisol (last plasma cortisol on 6/25 13.6) - Multiple myeloma panel  - Repeat subcutaneous Calcitonin 4 unit/kg once today - Zoledronic Acid contraindicated with current CrCl - AM BMP

## 2024-09-22 NOTE — Progress Notes (Signed)
 Daily Progress Note Intern Pager: [phone]  Patient name: Kristopher Thompson Medical record number: [id number] Date of birth: October 19, 1951 Age: 73 y.o. 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  Pertinent past medical history includes bladder cancer status post cystectomy with ileal conduit in 2018, h/o prostate cancer 2013 s/p prostatectomy, recent admission 05-05/2024 for incarcerated incisional, parastomal, and inguinal hernias with subsequent ileostomy, urostomy with ileal conduit revision, complicated by bowel perforation and septic shock requiring pressors and AKI requiring HD as well as urinary leak from conduit and b/l nephrostomy tube placement, 06/2024 admission for severe sepsis 2/2 abdominal wall infection, pyelonephritis and AKI with JP drain cultures +E coli and cornybacterium s/p removal 8/21, urine cultures +MRSA and E faecium, DM2, OSA, HTN, HFpEF CAD, RBBB, h/o PE on eliquis . 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 - Urology consulted, appreciate recommendations  - Continue ongoing volume resuscitation with IV fluids - Hematuria will probably continue since patient is on heparin  gtt - Trend hemoglobin; bleeding from urinary source very mild and most of hemoglobin drop is attributable to hemodilution - General Surgery consult to follow-up for increase in abdominal gas foci - General Surgery recommendations  - Operative abdominal wall I&D and exploration for Wednesday or Thursday evening or late Friday morning  - Begin loperamide  and FiberCon to help  calm diarrhea and bulk stool - Antibiotics - received Rocephin  IV once in ED --> consider transition to Zosyn , awaiting recommendations from urology and general surgery and holding abx for now - Blood cultures no growth less than 12 hours - Continue pain control with pain control: Tylenol  1000 mg Q6H PRN, Oxycodone  5-10 mg Q6H PRN - Nausea control with PRN v compazine   - AM Labs: CBC, BMP - Fall precautions - Daily weight - Dietitian consult - Delirium precautions - PT/OT consult, appreciate recommendations Hypercalcemia Corrected serum calcium  12.5 today, will begin workup for etiology of hypercalcemia with the following labs: - PTH  - PTH RP -TSH - Vitamin D  - AM plasma cortisol (last plasma cortisol on 6/25 13.6) - Multiple myeloma panel  - Repeat subcutaneous Calcitonin 4 unit/kg once today - Zoledronic Acid contraindicated with current CrCl - AM BMP Delayed wound healing Midline abdominal wound s/p bowel perforation with open abdomen.  Well-appearing without erythema or drainage with clean bandage on top. - Wound care consult consulted, appreciate recs - Bandage change and wound care has signed off at this time. Hyperkalemia (Resolved: 09/22/2024) Resolved, 4.4 today. Chronic health problem PE w/ Chronic anticoagulation : Hold Eliquis  5 mg tab BID.  Continue heparin  gtt.  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  Subjective:  Patient was seen and examined at bedside.  His wife is at bedside and during my examination trying to get patient to eat a breakfast of  eggs and potatoes.  Patient becomes visibly very nauseous from the smell of breakfast.  IV Compazine  was given for nausea and patient felt and appeared much better after the food was removed and he had calmed down with nausea medication.  He reports mild abdominal discomfort only with extreme nausea.  He is afebrile overnight and he denies any chest pain, leg pain, diaphoresis, or any other pain complaints at this time.   Objective: Temp:  [97.5 F (36.4 C)-98.4 F (36.9 C)] 98 F (36.7 C) (11/11 0820) Pulse Rate:  [80-116] 88 (11/11 0820) Resp:  [18-20] 19 (11/11 0820) BP: (88-123)/(55-77) 88/55 (11/11 0820) SpO2:  [96 %-100 %] 100 % (11/11 0820) Weight:  [74.8 kg] 74.8 kg (11/10 1155) Physical Exam: General: Chronically ill-appearing male sitting up in hospital bed in no acute distress Cardiovascular: RRR, no M/R/G, 2+ radial pulses Respiratory: CTAB, normal work of breathing on room air, no W/R/R Abdomen: Soft, nontender but does report mild discomfort, nondistended, diminished bowel sounds present, foley draining serosanguinous fluid, small amount of fecal material in ileostomy bag, small amount of urine in nephrostomy bag, clean bandage over midline abdominal wound without surrounding erythema, bleeding, drainage, or tenderness Extremities: Moves all extremities equally, no peripheral edema  Laboratory: Most recent CBC Lab Results  Component Value Date   WBC 10.5 09/22/2024   HGB 11.2 (L) 09/22/2024   HCT 34.2 (L) 09/22/2024   MCV 77.0 (L) 09/22/2024   PLT 332 09/22/2024   Most recent BMP    Latest Ref Rng & Units 09/22/2024    4:42 AM  BMP  Glucose 70 - 99 mg/dL 890   BUN 8 - 23 mg/dL 46   Creatinine 9.38 - 1.24 mg/dL 7.62   Sodium 864 - 854 mmol/L 134   Potassium 3.5 - 5.1 mmol/L 4.4   Chloride 98 - 111 mmol/L 105   CO2 22 - 32 mmol/L 16   Calcium  8.9 - 10.3 mg/dL 88.3     Other pertinent labs: Creatinine improved to 2.37, corrected calcium  12.5  Lupie Credit,  DO 09/22/2024, 8:21 AM  PGY-1, [facility] FPTS Intern pager: [phone], text pages welcome Secure chat group [facility] Teaching Service

## 2024-09-22 NOTE — Assessment & Plan Note (Addendum)
 Resolved, 4.4 today.

## 2024-09-22 NOTE — Assessment & Plan Note (Addendum)
 PE w/ Chronic anticoagulation : Hold Eliquis  5 mg tab BID.  Continue heparin  gtt.  Anxiety : Continue ATARAX  25 mg TID PRN, Zoloft  50 mg Daily  Insomnia: Continue Melatonin 3 mg at bedtime HLD : Continue CRESTOR  10 mg at bedtime Hypotensive : Continue Midodrine  10 mg TID w/ meal  High output ileostomy (HCC) Imodium  4 g TID GERD : Starting IV PPI DM II : Monitor Daily BG w/ AM BMP  CKD stage 3b baseline GFR 30-44   OSA  CHF Hearing loss

## 2024-09-22 NOTE — Assessment & Plan Note (Addendum)
 Corrected serum Calcium  14.0, ionized 1.58 - PTH collected and pending result - Given 1L NS bolus with 125 ml/hr NS - Given SubQ Calcitonin 4 unit/kg once  - Zoledronic Acid contraindicated with current CrCl - Will repeat calcium  ion w/ BMP in AM

## 2024-09-22 NOTE — Progress Notes (Signed)
 Physical Therapy Treatment Patient Details Name: Kristopher Thompson MRN: [id number] DOB: [birth year] Today's Date: 09/22/2024   History of Present Illness 73 y.o. male presenting to [facility] with complaint of nausea/vomiting, weakness, abdominal pain, and failure to thrive; concern for fistula near urostomy and slight increase in right hydronephrosis; PMHx: Admit 5/30-7/11 for hernia repair with bowel perforation and DC to snf, readmit 7/14-7/16 with D/C to CIR 7/16-8/8. HTN, dCHF, CAD, DM2, PE on Eliquis , anemia, obesity, prostate and bladder CA, hernia repair, urostomy, colostomy, RBBB    PT Comments  Continuing work on functional mobility and activity tolerance;  Returned to assist pt back to bed after being up for almost an hour; notably fatigued and needing slightly more assist as well as decr time in upright standing, but participating well; Will consider getting TED hose fo rnext session   If plan is discharge home, recommend the following: A little help with walking and/or transfers;A little help with bathing/dressing/bathroom;Assist for transportation;Assistance with cooking/housework   Can travel by private vehicle        Equipment Recommendations  None recommended by PT    Recommendations for Other Services OT consult     Precautions / Restrictions Precautions Precautions: Fall;Other (comment) Precaution/Restrictions Comments: BPs soft, and with a drop in standing; Multiple tubes an dlines; L nephrostomy, RUQ ileostomy, urostomy Restrictions Weight Bearing Restrictions Per Provider Order: No     Mobility  Bed Mobility Overal bed mobility: Needs Assistance Bed Mobility: Sit to Supine     Supine to sit: Min assist Sit to supine: Min assist, +2 for safety/equipment   General bed mobility comments: Min assist to help LEs into bed    Transfers Overall transfer level: Needs assistance Equipment used: Rolling walker (2 wheels) Transfers: Sit to/from Stand,  Bed to chair/wheelchair/BSC Sit to Stand: Min assist, +2 safety/equipment   Step pivot transfers: Min assist, +2 safety/equipment       General transfer comment: Minimized time in standing as pt was tired adn painful    Ambulation/Gait                   Stairs             Wheelchair Mobility     Tilt Bed    Modified Rankin (Stroke Patients Only)       Balance Overall balance assessment: Mild deficits observed, not formally tested                                          Communication Communication Communication: Impaired Factors Affecting Communication: Hearing impaired  Cognition Arousal: Alert Behavior During Therapy: WFL for tasks assessed/performed   PT - Cognitive impairments: No apparent impairments                         Following commands: Intact      Cueing Cueing Techniques: Verbal cues  Exercises      General Comments General comments (skin integrity, edema, etc.): positioned pt in semi-R sidelying to offload painful area where nephrostomy tube was      Pertinent Vitals/Pain Pain Assessment Pain Assessment: No/denies pain Faces Pain Scale: Hurts little more Pain Location: L nephrostomy tube Pain Descriptors / Indicators: Grimacing, Guarding Pain Intervention(s): Limited activity within patient's tolerance    Home Living Family/patient expects to be discharged to:: Private residence Living Arrangements: Spouse/significant other  Available Help at Discharge: Family;Available 24 hours/day Type of Home: House Home Access: Stairs to enter (coming in from the garage 2 stairs with rails both side) Entrance Stairs-Rails: Can reach both Entrance Stairs-Number of Steps: 2 Alternate Level Stairs-Number of Steps: 14-16 stairs Home Layout: Two level;Able to live on main level with bedroom/bathroom Home Equipment: Rollator (4 wheels);Rolling Walker (2 wheels) Additional Comments: taken per chart review and  patient    Prior Function            PT Goals (current goals can now be found in the care plan section) Acute Rehab PT Goals Patient Stated Goal: to get home and not get readmitted PT Goal Formulation: With patient Time For Goal Achievement: 10/06/24 Potential to Achieve Goals: Good Progress towards PT goals: Progressing toward goals    Frequency    Min 2X/week      PT Plan      Co-evaluation              AM-PAC PT 6 Clicks Mobility   Outcome Measure  Help needed turning from your back to your side while in a flat bed without using bedrails?: None Help needed moving from lying on your back to sitting on the side of a flat bed without using bedrails?: A Little Help needed moving to and from a bed to a chair (including a wheelchair)?: A Little Help needed standing up from a chair using your arms (e.g., wheelchair or bedside chair)?: A Little Help needed to walk in hospital room?: Total Help needed climbing 3-5 steps with a railing? : Total 6 Click Score: 15    End of Session Equipment Utilized During Treatment: Gait belt Activity Tolerance: Patient tolerated treatment well (though with BP drop in standing) Patient left: in bed;with call bell/phone within reach Nurse Communication: Mobility status;Other (comment) (and BP drop in standing) PT Visit Diagnosis: Unsteadiness on feet (R26.81);Other abnormalities of gait and mobility (R26.89);Dizziness and giddiness (R42);Difficulty in walking, not elsewhere classified (R26.2)     Time: 8858-8843 PT Time Calculation (min) (ACUTE ONLY): 15 min  Charges:    $Therapeutic Activity: 8-22 mins PT General Charges $$ ACUTE PT VISIT: 1 Visit                     Silvano Currier, PT  Acute Rehabilitation Services Office [phone] Secure Chat welcomed    [name] 09/22/2024, 3:27 PM

## 2024-09-23 ENCOUNTER — Inpatient Hospital Stay (HOSPITAL_COMMUNITY)

## 2024-09-23 DIAGNOSIS — R319 Hematuria, unspecified: Secondary | ICD-10-CM | POA: Diagnosis not present

## 2024-09-23 DIAGNOSIS — R579 Shock, unspecified: Secondary | ICD-10-CM | POA: Diagnosis not present

## 2024-09-23 DIAGNOSIS — R311 Benign essential microscopic hematuria: Secondary | ICD-10-CM

## 2024-09-23 DIAGNOSIS — N390 Urinary tract infection, site not specified: Secondary | ICD-10-CM

## 2024-09-23 DIAGNOSIS — N1330 Unspecified hydronephrosis: Secondary | ICD-10-CM | POA: Diagnosis not present

## 2024-09-23 DIAGNOSIS — L02211 Cutaneous abscess of abdominal wall: Secondary | ICD-10-CM

## 2024-09-23 DIAGNOSIS — E46 Unspecified protein-calorie malnutrition: Secondary | ICD-10-CM

## 2024-09-23 DIAGNOSIS — N179 Acute kidney failure, unspecified: Secondary | ICD-10-CM

## 2024-09-23 DIAGNOSIS — E43 Unspecified severe protein-calorie malnutrition: Secondary | ICD-10-CM | POA: Insufficient documentation

## 2024-09-23 DIAGNOSIS — I951 Orthostatic hypotension: Secondary | ICD-10-CM | POA: Insufficient documentation

## 2024-09-23 DIAGNOSIS — B9620 Unspecified Escherichia coli [E. coli] as the cause of diseases classified elsewhere: Secondary | ICD-10-CM

## 2024-09-23 LAB — BASIC METABOLIC PANEL WITH GFR
Anion gap: 10 (ref 5–15)
Anion gap: 9 (ref 5–15)
BUN: 37 mg/dL — ABNORMAL HIGH (ref 8–23)
BUN: 30 mg/dL — ABNORMAL HIGH (ref 8–23)
CO2: 17 mmol/L — ABNORMAL LOW (ref 22–32)
CO2: 16 mmol/L — ABNORMAL LOW (ref 22–32)
Calcium: 9.2 mg/dL (ref 8.9–10.3)
Calcium: 8.7 mg/dL — ABNORMAL LOW (ref 8.9–10.3)
Chloride: 111 mmol/L (ref 98–111)
Chloride: 116 mmol/L — ABNORMAL HIGH (ref 98–111)
Creatinine, Ser: 1.7 mg/dL — ABNORMAL HIGH (ref 0.61–1.24)
Creatinine, Ser: 1.47 mg/dL — ABNORMAL HIGH (ref 0.61–1.24)
GFR, Estimated: 42 mL/min — ABNORMAL LOW (ref >60)
GFR, Estimated: 50 mL/min — ABNORMAL LOW (ref >60)
Glucose, Bld: 89 mg/dL (ref 70–99)
Glucose, Bld: 103 mg/dL — ABNORMAL HIGH (ref 70–99)
Potassium: 3.7 mmol/L (ref 3.5–5.1)
Potassium: 3.5 mmol/L (ref 3.5–5.1)
Sodium: 138 mmol/L (ref 135–145)
Sodium: 141 mmol/L (ref 135–145)

## 2024-09-23 LAB — URINE CULTURE: Culture: >=100000 — AB

## 2024-09-23 LAB — CBC
HCT: 27 % — ABNORMAL LOW (ref 39.0–52.0)
HCT: 26.6 % — ABNORMAL LOW (ref 39.0–52.0)
Hemoglobin: 8.9 g/dL — ABNORMAL LOW (ref 13.0–17.0)
Hemoglobin: 8.5 g/dL — ABNORMAL LOW (ref 13.0–17.0)
MCH: 25.9 pg — ABNORMAL LOW (ref 26.0–34.0)
MCH: 25.4 pg — ABNORMAL LOW (ref 26.0–34.0)
MCHC: 33 g/dL (ref 30.0–36.0)
MCHC: 32 g/dL (ref 30.0–36.0)
MCV: 78.5 fL — ABNORMAL LOW (ref 80.0–100.0)
MCV: 79.4 fL — ABNORMAL LOW (ref 80.0–100.0)
Platelets: 217 K/uL (ref 150–400)
Platelets: 225 K/uL (ref 150–400)
RBC: 3.44 MIL/uL — ABNORMAL LOW (ref 4.22–5.81)
RBC: 3.35 MIL/uL — ABNORMAL LOW (ref 4.22–5.81)
RDW: 16.4 % — ABNORMAL HIGH (ref 11.5–15.5)
RDW: 16.7 % — ABNORMAL HIGH (ref 11.5–15.5)
WBC: 7 K/uL (ref 4.0–10.5)
WBC: 6.3 K/uL (ref 4.0–10.5)
nRBC: 0 % (ref 0.0–0.2)
nRBC: 0 % (ref 0.0–0.2)

## 2024-09-23 LAB — CALCIUM, IONIZED: Calcium, Ionized, Serum: 6.4 mg/dL — ABNORMAL HIGH (ref 4.5–5.6)

## 2024-09-23 LAB — TSH: TSH: <0.1 u[IU]/mL — ABNORMAL LOW (ref 0.350–4.500)

## 2024-09-23 LAB — MAGNESIUM
Magnesium: 1.7 mg/dL (ref 1.7–2.4)
Magnesium: 1.5 mg/dL — ABNORMAL LOW (ref 1.7–2.4)

## 2024-09-23 LAB — T4, FREE: Free T4: 1.63 ng/dL — ABNORMAL HIGH (ref 0.61–1.12)

## 2024-09-23 LAB — APTT: aPTT: 87 s — ABNORMAL HIGH (ref 24–36)

## 2024-09-23 LAB — CORTISOL-AM, BLOOD: Cortisol - AM: 18.7 ug/dL (ref 6.7–22.6)

## 2024-09-23 LAB — VITAMIN D 25 HYDROXY (VIT D DEFICIENCY, FRACTURES): Vit D, 25-Hydroxy: 35.24 ng/mL (ref 30–100)

## 2024-09-23 LAB — GLUCOSE, CAPILLARY: Glucose-Capillary: 98 mg/dL (ref 70–99)

## 2024-09-23 LAB — PHOSPHORUS: Phosphorus: 2.3 mg/dL — ABNORMAL LOW (ref 2.5–4.6)

## 2024-09-23 LAB — LACTIC ACID, PLASMA: Lactic Acid, Venous: 1.3 mmol/L (ref 0.5–1.9)

## 2024-09-23 LAB — PARATHYROID HORMONE, INTACT (NO CA): PTH: <6 pg/mL — ABNORMAL LOW (ref 15–65)

## 2024-09-23 LAB — HEPARIN LEVEL (UNFRACTIONATED): Heparin Unfractionated: 0.89 [IU]/mL — ABNORMAL HIGH (ref 0.30–0.70)

## 2024-09-23 MED ORDER — ALBUMIN HUMAN 25 % IV SOLN
25.0000 g | Freq: Once | INTRAVENOUS | Status: AC
  Administered 2024-09-23: 12.5 g via INTRAVENOUS
  Filled 2024-09-23: qty 100

## 2024-09-23 MED ORDER — POTASSIUM PHOSPHATES 15 MMOLE/5ML IV SOLN
15.0000 mmol | Freq: Once | INTRAVENOUS | Status: AC
  Administered 2024-09-23: 15 mmol via INTRAVENOUS
  Filled 2024-09-23 (×2): qty 5

## 2024-09-23 MED ORDER — SODIUM CHLORIDE 0.9 % IV SOLN: 250.0000 mL | INTRAVENOUS | Status: AC

## 2024-09-23 MED ORDER — MIDODRINE HCL 5 MG PO TABS
15.0000 mg | ORAL_TABLET | Freq: Three times a day (TID) | ORAL | Status: DC
  Administered 2024-09-23 – 2024-10-01 (×24): 15 mg via ORAL
  Filled 2024-09-23 (×24): qty 3

## 2024-09-23 MED ORDER — CHLORHEXIDINE GLUCONATE CLOTH 2 % EX PADS
6.0000 | MEDICATED_PAD | Freq: Every day | CUTANEOUS | Status: DC
  Administered 2024-09-23 – 2024-10-01 (×8): 6 via TOPICAL

## 2024-09-23 MED ORDER — LACTATED RINGERS IV BOLUS
1000.0000 mL | Freq: Three times a day (TID) | INTRAVENOUS | Status: AC | PRN
  Administered 2024-09-23: 1000 mL via INTRAVENOUS

## 2024-09-23 MED ORDER — NOREPINEPHRINE 4 MG/250ML-% IV SOLN
0.0000 ug/min | INTRAVENOUS | Status: DC
  Administered 2024-09-23 – 2024-09-26 (×5): 2 ug/min via INTRAVENOUS
  Filled 2024-09-23 (×3): qty 250

## 2024-09-23 MED ORDER — SODIUM CHLORIDE 0.9 % IV BOLUS
1000.0000 mL | Freq: Every day | INTRAVENOUS | Status: AC
  Administered 2024-09-23: 1000 mL via INTRAVENOUS

## 2024-09-23 NOTE — Plan of Care (Signed)
 Received secure chat for MAP of 54. Reassuringly at mental baseline.   He is s/p 2 L bolus of fluids today. He has received 2 doses of midodrine  10 mg today along with increased dose of 15 mg midodrine  at 1600.   Will order 3rd bolus of 1 L fluids. CCM attending on call will add patient to the list to be seen.   Damien Pinal, DO [facility], PGY-3 09/23/24 6:49 PM

## 2024-09-23 NOTE — H&P (View-Only) (Signed)
 Able to find operating time tomorrow.   Plan abdominal wall flank incision and drainage/debridement for Thursday 11/13 around 4 PM at [facility], operating room #5.    Patient can have solid diet today.   Clears after midnight.   N.p.o. after 5 AM.    I wrote PRN (as needed) IV fluid boluses just in case but will defer to primary on chronic IV fluids.  Would be helpful if heparin  is held 6 hours prior to surgery = hold 10 AM tomorrow  Will follow-up with patient in holding area tomorrow afternoon to clarify any further concerns  Elspeth KYM Schultze, MD, FACS, MASCRS Esophageal, Gastrointestinal & Colorectal Surgery Robotic and Minimally Invasive Surgery  [facility] [address], [address] [place], [place] [address] [phone] Fax [phone] Main  CONTACT INFORMATION: Weekday (9AM-5PM): Call [facility] main office at [phone] Weeknight (5PM-9AM) or Weekend/Holiday: Check EPIC Web Links tab & use AMION (password  [email or web address]) for General Surgery [facility] coverage  Please, DO NOT use SecureChat  (it is not reliable communication to reach operating surgeons & will lead to a delay in care).   Epic staff messaging available for outpatient concerns needing 1-2 business day response.

## 2024-09-23 NOTE — Progress Notes (Signed)
 PT Cancellation Note  Patient Details Name: Kristopher Thompson MRN: [id number] DOB: [birth year]   Cancelled Treatment:    Reason Eval/Treat Not Completed: Other (comment)  Hypotensive in supine; Eating lunch at this time;   Will follow up later today as time allows;  Otherwise, will follow up for PT tomorrow;   Thank you,  Silvano Currier, PT  Acute Rehabilitation Services Office [phone]    [name] 09/23/2024, 11:57 AM

## 2024-09-23 NOTE — Progress Notes (Signed)
   09/23/24 1818  Assess: MEWS Score  Temp 97.9 F (36.6 C)  BP (!) 77/43 (MD notified)  MAP (mmHg) (!) 54  Pulse Rate 67  Resp 20  SpO2 100 %  O2 Device Room Air  Assess: MEWS Score  MEWS Temp 0  MEWS Systolic 2  MEWS Pulse 0  MEWS RR 0  MEWS LOC 0  MEWS Score 2  MEWS Score Color Yellow  Assess: if the MEWS score is Yellow or Red  Were vital signs accurate and taken at a resting state? Yes  Does the patient meet 2 or more of the SIRS criteria? No  MEWS guidelines implemented  Yes, yellow  Treat  MEWS Interventions Considered administering scheduled or prn medications/treatments as ordered  Take Vital Signs  Increase Vital Sign Frequency  Yellow: Q2hr x1, continue Q4hrs until patient remains green for 12hrs  Escalate  MEWS: Escalate Yellow: Discuss with charge nurse and consider notifying provider and/or RRT  Notify: Charge Nurse/RN  Name of Charge Nurse/RN Notified Glass Blower/designer  Provider Notification  Provider Name/Title Damien Pinal MD  Date Provider Notified 09/23/24  Time Provider Notified 1820  Method of Notification Page  Notification Reason Change in status (Yellow MEWS)  Provider response See new orders;Other (Comment) (MD to call CCM to evaluate)  Date of Provider Response 09/23/24  Time of Provider Response 1820  Assess: SIRS CRITERIA  SIRS Temperature  0  SIRS Respirations  0  SIRS Pulse 0  SIRS WBC 0  SIRS Score Sum  0

## 2024-09-23 NOTE — Progress Notes (Signed)
 Pt  transferred to 82M06 from 5M01 via bed by Mindy RN rapid response and [place]. Verbal report given to Nate RN on 382M unit, the patient is alert x 4, vital signs BP  79/54, HR 62, 99 sats on room air. resp 16, no pressure ulcers noted skin integrity intact. Pt and wife  verbalized understanding of plan of care and was agreeable to transfer.All patient belongings packed by wife and sent with patient to 82M.

## 2024-09-23 NOTE — Assessment & Plan Note (Addendum)
-   Urology consulted, appreciate recommendations  - Continue ongoing volume resuscitation with IV fluids - Hematuria will probably continue since patient is on heparin  gtt - Trend hemoglobin; bleeding from urinary source very mild and most of hemoglobin drop is attributable to hemodilution - General Surgery consult to follow-up for increase in abdominal gas foci - General Surgery recommendations  - Operative abdominal wall I&D and exploration for 4pm Thursday 11/13  - Begin loperamide  and FiberCon to help calm diarrhea and bulk stool - Antibiotics - received Rocephin  IV x1 in ED --> transition to Zosyn  dosed per pharmacy - Blood cultures NGTD - Urine culture + for E. coli - Continue pain control with pain control: Tylenol  1000 mg Q6H PRN, Oxycodone  5-10 mg Q6H PRN - Nausea control with PRN IV compazine   - AM Labs: CBC, BMP - Fall precautions - Daily weight - Delirium precautions - PT/OT consult, appreciate recommendations

## 2024-09-23 NOTE — Consult Note (Signed)
 NAME:  Kristopher Thompson, MRN:  [id number], DOB:  06-22-1951, LOS: 2 ADMISSION DATE:  09/21/2024, CONSULTATION DATE: 09/23/2024 REFERRING MD: Family medicine team, CHIEF COMPLAINT: Recurrent abdominal abscess  History of Present Illness:  [name] is a 73 year old male with a history complex medical history including bladder CA s/p cystectomy with ileal conduit-2018, prostate CA s/p prostatectomy, recent admission 7/25 for incarcerated incisional, parasternal parastomal and inguinal hernia with subsequent ileostomy, urostomy with ileal conduit revision, presents with nausea vomiting and abdominal pain and is being managed 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 Pending abdominal wall flank I&D and washout for 11/13 Critical care team is brought on board for persistent hypotension refractory to fluids  Pertinent  Medical History  bladder CA s/p cystectomy with ileal conduit-2018,  prostate CA s/p prostatectomy recent admission 7/25 for incarcerated incisional, parasternal parastomal and inguinal hernia with subsequent ileostomy, urostomy with ileal conduit revision, complicated by bowel perforation and septic shock PE on Eliquis   Significant Hospital Events: Including procedures, antibiotic start and stop dates in addition to other pertinent events   11/10 admitted for 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 rate 20, height 5' 11 (1.803 m), weight 74.8 kg, SpO2 100%.        Intake/Output Summary (Last 24 hours) at 09/23/2024 2015 Last data filed at 09/23/2024 1743 Gross per 24 hour  Intake 635.05 ml  Output 2175 ml  Net -1539.95 ml   Filed Weights   09/21/24 1155  Weight: 74.8 kg    Examination: General: Resting in bed, not in acute  distress HENT: NCAT Lungs: Symmetrical chest wall movement, clear to auscultation Cardiovascular: S1-S2 normal, no murmur Abdomen: Soft nontender nondistended, well-healing midline incision, ileostomy open (brown liquid stool )and nephrostomy bag in place. Extremities: Warm no edema Neuro: AO X3, intact mentation, intact motor and sensation GU: Deferred  Resolved problem list   Assessment and Plan   73 year old male with complex medical history including bladder CA s/p cystectomy with ileal conduit, recent admission for incarcerated parastomal and incisional hernia with subsequent ileostomy, urostomy with ileal conduit revision, presents with recurrent abdominal abscess.  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 after 5 AM of 11/13 - Hold heparin  10 AM of 11/13, 6 hours before surgery - Urology and general surgery team on board - Strict I&O's - For high output ostomy patient already on loperamide  and FiberCon - Continue Zosyn     Labs   CBC: Recent Labs  Lab 09/21/24 1232 09/21/24 1526 09/22/24 0442 09/23/24 0328  WBC 12.7*  --  10.5 7.0  NEUTROABS 10.1*  --   --   --   HGB 13.1 13.9 11.2* 8.9*  HCT 41.5 41.0 34.2* 27.0*  MCV 79.5*  --  77.0* 78.5*  PLT 460*  --  332 217    Basic Metabolic Panel: Recent Labs  Lab 09/21/24 1232 09/21/24 1526 09/22/24 0442 09/23/24 0328  NA 132* 130* 134* 138  K 5.2* 5.2* 4.4 3.7  CL 97* 104  105 111  CO2 20*  --  16* 17*  GLUCOSE 122* 113* 109* 89  BUN 49* 60* 46* 37*  CREATININE 2.53* 2.80* 2.37* 1.70*  CALCIUM  14.2*  --  11.6* 9.2  MG 2.5*  --   --  1.7  PHOS  --   --  3.6 2.3*   GFR: Estimated Creatinine Clearance: 41.6 mL/min (A) (by C-G formula based on SCr of 1.7 mg/dL (H)). Recent Labs  Lab 09/21/24 1232 09/21/24 1241 09/21/24 1453 09/22/24 0442 09/23/24 0328  WBC 12.7*  --   --  10.5 7.0  LATICACIDVEN  --  1.6 1.9  --   --     Liver Function Tests: Recent Labs  Lab 09/21/24 1232 09/22/24 0442  AST 26 19  ALT 25 18  ALKPHOS 119 79  BILITOT 0.5 0.6  PROT 8.8* 6.6  ALBUMIN  4.2 2.9*   Recent Labs  Lab 09/21/24 1232  LIPASE 22   No results for input(s): AMMONIA in the last 168 hours.  ABG    Component Value Date/Time   PHART 7.28 (L) 04/21/2024 1715   PCO2ART 47 04/21/2024 1715   PO2ART 75 (L) 04/21/2024 1715   HCO3 14.2 (L) 05/26/2024 0423   TCO2 21 (L) 09/21/2024 1526   ACIDBASEDEF 10.1 (H) 05/26/2024 0423   O2SAT 90.5 05/26/2024 0423     Coagulation Profile: Recent Labs  Lab 09/21/24 1232  INR 1.3*    Cardiac Enzymes: No results for input(s): CKTOTAL, CKMB, CKMBINDEX, TROPONINI in the last 168 hours.  HbA1C: Hgb A1c MFr Bld  Date/Time Value Ref Range Status  04/01/2024 10:50 AM 5.3 4.8 - 5.6 % Final    Comment:    (NOTE) Pre diabetes:          5.7%-6.4%  Diabetes:              >6.4%  Glycemic control for   <7.0% adults with diabetes     CBG: No results for input(s): GLUCAP in the last 168 hours.  Review of Systems:   Negative except above  Past Medical History:  He,  has a past medical history of At risk for sleep apnea, Atypical nevus (05/25/2005), Atypical nevus (04/04/2007), Atypical nevus (04/04/2007), Atypical nevus (04/04/2007), Atypical nevus (11/29/2011), Atypical nevus (11/29/2011), Bacteremia due to Klebsiella pneumoniae (10/09/2017), Bladder cancer (HCC) (dx 07/2017), Candida infection, CHF  (congestive heart failure) (HCC), Colostomy in place [facility]), Diabetes mellitus without complication (HCC), GERD (gastroesophageal reflux disease), H/O hiatal hernia, History of sepsis (09/2017), Prostate cancer [facility]) (urologist-- dr renda), RBBB, Renal disorder, Sleep apnea, Squamous cell carcinoma of skin (05/22/2013), and Wears glasses.   Surgical History:   Past Surgical History:  Procedure Laterality Date   ABDOMINAL SURGERY     APPENDECTOMY  1972   BOWEL RESECTION N/A 04/20/2024   Procedure: SMALL BOWEL RESECTION;  Surgeon: [name], MD;  Location: [facility];  Service: General;  Laterality: N/A;   CHOLECYSTECTOMY  1985   COLONOSCOPY N/A 06/29/2021   Procedure: COLONOSCOPY;  Surgeon: Debby Hila, MD;  [facility];  Service: Endoscopy;  Laterality: N/A;   COLOSTOMY REVERSAL N/A 01/08/2018   Procedure: COLOSTOMY REVERSAL;  Surgeon: Debby Hila, MD;  Location: [facility];  Service: General;  Laterality: N/A;   CYSTOSCOPY  WITH RETROGRADE PYELOGRAM, URETEROSCOPY AND STENT PLACEMENT Right 06/10/2017   Procedure: CYSTOSCOPY WITH RIGHT URETEROSCOPY WITH RIGHT STENT PLACEMENT;  Surgeon: [name], MD;  Location[facility];  Service: Urology;  Laterality: Right;   EUS N/A 04/16/2018   Procedure: FULL UPPER ENDOSCOPIC ULTRASOUND (EUS) RADIAL;  Surgeon: Burnette Fallow, MD;  [facility];  Service: Endoscopy;  Laterality: N/A;   FLEXIBLE SIGMOIDOSCOPY N/A 12/13/2017   Procedure: FLEXIBLE SIGMOIDOSCOPY;  Surgeon: Debby Hila, MD;  [facility];  Service: Endoscopy;  Laterality: N/A;   ILEO CONDUIT     IR CATHETER TUBE CHANGE  12/13/2017   IR CATHETER TUBE CHANGE  01/17/2018   IR CATHETER TUBE CHANGE  02/28/2018   IR CATHETER TUBE CHANGE  07/18/2018   IR CATHETER TUBE CHANGE  08/22/2018   IR CATHETER TUBE CHANGE  10/31/2018   IR CONVERT LEFT NEPHROSTOMY TO NEPHROURETERAL CATH  10/24/2017   IR EXT NEPHROURETERAL CATH EXCHANGE  05/19/2018   IR EXT NEPHROURETERAL CATH EXCHANGE   06/16/2018   IR EXT NEPHROURETERAL CATH EXCHANGE  07/09/2024   IR NEPHRO TUBE REMOV/FL  10/24/2017   IR NEPHRO TUBE REMOV/FL  08/31/2024   IR NEPHROSTOGRAM LEFT THRU EXISTING ACCESS  12/05/2018   IR NEPHROSTOGRAM RIGHT THRU EXISTING ACCESS  08/31/2024   IR NEPHROSTOMY EXCHANGE LEFT  05/11/2024   IR NEPHROSTOMY EXCHANGE LEFT  07/09/2024   IR NEPHROSTOMY EXCHANGE LEFT  08/31/2024   IR NEPHROSTOMY PLACEMENT LEFT  10/07/2017   IR NEPHROSTOMY PLACEMENT RIGHT  05/04/2024   IR NEPHROSTOMY TUBE CHANGE  04/11/2018   IR URETERAL STENT PLACEMENT EXISTING ACCESS LEFT  05/13/2024   IR URETERAL STENT RIGHT NEW ACCESS W/SEP NEPHROSTOMY CATH  05/13/2024   LAPAROTOMY N/A 04/20/2024   Procedure: EXPLORATORY LAPAROTOMY;  Surgeon: [name], MD;  [facility];  Service: General;  Laterality: N/A;   PARTIAL COLECTOMY N/A 04/21/2024   Procedure: COLECTOMY, PARTIAL;  Surgeon: [name], MD;  Location[facility];  Service: General;  Laterality: N/A;  Removal Wound Vac, Washout Ostomy, Possible Anastomosis, Possible Ileostomy.  Phasix Mesh.   POLYPECTOMY  06/29/2021   Procedure: POLYPECTOMY;  Surgeon: Debby Hila, MD;  [facility];  Service: Endoscopy;;   ROBOT ASSISTED LAPAROSCOPIC RADICAL PROSTATECTOMY  10/02/2012   Procedure: ROBOTIC ASSISTED LAPAROSCOPIC RADICAL PROSTATECTOMY LEVEL 2;  Surgeon: Noretta Renda, MD;  [facility];  Service: Urology;  Laterality: N/A;   ROBOTIC ASSISTED LAPAROSCOPIC BLADDER DIVERTICULECTOMY N/A 08/08/2017   Procedure: XI ROBOTIC ASSISTED LAPAROSCOPIC RADICAL CYSTECTOMY COVERTED TO OPEN PELVIC LYMPHADNECTOMY BILATERAL AND ILEAL CONDUIT URINARY DIVERSION;  Surgeon: [name], MD;  [facility];  Service: Urology;  Laterality: N/A;   TRANSURETHRAL RESECTION OF BLADDER TUMOR  06/10/2017   Procedure: TRANSURETHRAL RESECTION OF BLADDER TUMOR (TURBT);  Surgeon: [name], MD;  [facility];  Service: Urology;;   VACUUM ASSISTED CLOSURE CHANGE N/A 04/20/2024    Procedure: PLACEMENT OF CONCETTA CAPES;  Surgeon: [name], MD;  [facility];  Service: General;  Laterality: N/A;   VENTRAL HERNIA REPAIR N/A 04/10/2024   Procedure: REPAIR, HERNIA, VENTRAL;  Surgeon: [name], MD;  [facility];  Service: General;  Laterality: N/A;   XI ROBOT ABDOMINAL PERINEAL RESECTION N/A 08/08/2017   Procedure: REPAIR OF RECTAL TEAR POSSIBLE PARTIAL PROCTECTOMY, CREATION OF  OSTOMY;  Surgeon: Debby Hila, MD;  [facility];  Service: General;  Laterality: N/A;   XI ROBOTIC ASSISTED PARASTOMAL HERNIA REPAIR N/A 04/10/2024   Procedure: REPAIR, HERNIA, PARASTOMAL AND VENTRAL HERNIAS,  ROBOT-ASSISTED, LEFT INGUINAL HERNIA, LYSIS OF ADHESIONS INCARCERATED AND INSIONAL HERNIAS AND UROSTOMY REVISION;  Surgeon: [name], MD;  Location: [facility];  Service: General;  Laterality: N/A;     Social History:   reports that he quit smoking about 44 years ago. His smoking use included cigarettes. He started smoking about 53 years ago. He has never used smokeless tobacco. He reports current alcohol  use. He reports that he does not use drugs.   Family History:  His family history includes Colon cancer in an other family member; Hypertension in his father; Lung cancer in his mother. There is no history of CAD, Diabetes, or Stroke.   Allergies Allergies  Allergen Reactions   Demerol  [Meperidine ] Nausea And Vomiting     Home Medications  Prior to Admission medications   Medication Sig Start Date End Date Taking? Authorizing Provider  acetaminophen  (TYLENOL ) 325 MG tablet Take 1-2 tablets (325-650 mg total) by mouth every 4 (four) hours as needed for mild pain (pain score 1-3). 06/18/24  Yes Jerilynn Daphne SAILOR, NP  cyanocobalamin  1000 MCG tablet Take 0.5 tablets (500 mcg total) by mouth every Monday, Wednesday, and Friday. 08/14/24  Yes Love, Sharlet RAMAN, PA-C  ELIQUIS  5 MG TABS tablet Take 1 tablet (5 mg total) by mouth 2 (two) times daily. 08/13/24  Yes Love, Sharlet RAMAN, PA-C   hydrOXYzine  (ATARAX ) 25 MG tablet Take 1 tablet (25 mg total) by mouth 3 (three) times daily as needed for anxiety. 08/13/24  Yes Love, Sharlet RAMAN, PA-C  ipratropium (ATROVENT ) 0.03 % nasal spray Place 1 spray into both nostrils every 8 (eight) hours as needed (allergies).   Yes [provider]  loperamide  (IMODIUM ) 2 MG capsule Take 2 capsules (4 mg total) by mouth 3 (three) times daily. 08/13/24  Yes Love, Sharlet RAMAN, PA-C  melatonin 3 MG TABS tablet Take 1 tablet (3 mg total) by mouth at bedtime. 08/13/24  Yes Love, Sharlet RAMAN, PA-C  midodrine  (PROAMATINE ) 5 MG tablet Take 2 tablets (10 mg total) by mouth 3 (three) times daily with meals. 08/13/24  Yes Love, Sharlet RAMAN, PA-C  Nutritional Supplement LIQD Take 1 Bottle by mouth daily as needed (for snack).   Yes [provider]  ondansetron  (ZOFRAN -ODT) 4 MG disintegrating tablet Take 1 tablet (4 mg total) by mouth daily as needed for nausea or vomiting. 09/16/24  Yes Emeline Joesph BROCKS, DO  Pediatric Multiple Vitamins (CHILDRENS MULTIVITAMIN) chewable tablet Chew 1 tablet by mouth 2 (two) times daily. 08/13/24  Yes Love, Sharlet RAMAN, PA-C  polycarbophil (FIBERCON) 625 MG tablet Take 1 tablet (625 mg total) by mouth 2 (two) times daily. 08/13/24  Yes Love, Sharlet RAMAN, PA-C  rosuvastatin  (CRESTOR ) 10 MG tablet Take 1 tablet (10 mg total) by mouth every evening. 08/13/24  Yes Love, Sharlet RAMAN, PA-C  sertraline  (ZOLOFT ) 50 MG tablet Take 1 tablet (50 mg total) by mouth daily for 30 days, THEN 2 tablets (100 mg total) daily. 09/16/24 11/15/24 Yes Engler, Joesph BROCKS, DO  sodium chloride  (OCEAN) 0.65 % SOLN nasal spray Place 1-2 sprays into both nostrils every 6 (six) hours as needed for congestion (dry nose). 05/22/24  Yes Sherrill Alejandro Donovan, DO     Critical care time:     Lenny Sharie ROLLA [facility] Prefer epic messenger for cross cover needs  Critical care time was exclusive of separately billable procedures and treating  other patients.  Critical care was necessary to treat or prevent imminent or life-threatening deterioration.  Critical care was time spent personally by me on the following activities: development of treatment plan with patient and/or surrogate as well as nursing, discussions with consultants, evaluation of patient's response to treatment, examination of patient, obtaining history from patient or surrogate, ordering and performing treatments and interventions, ordering and review of laboratory studies, ordering and review of radiographic studies, pulse oximetry, re-evaluation of patient's condition and participation in multidisciplinary rounds.

## 2024-09-23 NOTE — Progress Notes (Signed)
 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   eICU Interventions  New Patient Evaluation.        [name] U [name] 09/23/2024, 9:24 PM

## 2024-09-23 NOTE — Assessment & Plan Note (Signed)
-   Urology consulted, appreciate recommendations  - Continue ongoing volume resuscitation with IV fluids - Hematuria will probably continue since patient is on heparin  gtt - Trend hemoglobin; bleeding from urinary source very mild and most of hemoglobin drop is attributable to hemodilution - General Surgery consult to follow-up for increase in abdominal gas foci - General Surgery recommendations  - Operative abdominal wall I&D and exploration for 4pm Thursday 11/13  - Begin loperamide  and FiberCon to help calm diarrhea and bulk stool - Antibiotics - received Rocephin  IV x1 in ED --> transition to Zosyn  dosed per pharmacy - Blood cultures NGTD - Urine culture + for E. coli - Continue pain control with pain control: Tylenol  1000 mg Q6H PRN, Oxycodone  5-10 mg Q6H PRN - Nausea control with PRN IV compazine   - AM Labs: CBC, BMP - Fall precautions - Daily weight - Delirium precautions - PT/OT consult, appreciate recommendations

## 2024-09-23 NOTE — Progress Notes (Signed)
 ANTICOAGULATION CONSULT NOTE  Pharmacy Consult for Heparin  Indication: hx of PE on apixaban   Allergies  Allergen Reactions   Demerol  [Meperidine ] Nausea And Vomiting    Patient Measurements: Height: 5' 11 (180.3 cm) Weight: 74.8 kg (165 lb) IBW/kg (Calculated) : 75.3 Heparin  Dosing Weight: 74.8 kg  Vital Signs: Temp: 98.6 F (37 C) (11/12 0525) Temp Source: Oral (11/11 2027) BP: 92/51 (11/12 0525) Pulse Rate: 72 (11/12 0525)  Labs: Recent Labs    09/21/24 1232 09/21/24 1526 09/21/24 1526 09/21/24 2150 09/22/24 0442 09/22/24 1309 09/23/24 0328  HGB 13.1 13.9  --   --  11.2*  --  8.9*  HCT 41.5 41.0  --   --  34.2*  --  27.0*  PLT 460*  --   --   --  332  --  217  APTT  --   --    < > 39* 75* 66* 87*  LABPROT 16.9*  --   --   --   --   --   --   INR 1.3*  --   --   --   --   --   --   HEPARINUNFRC  --   --   --  >1.10* >1.10*  --  0.89*  CREATININE 2.53* 2.80*  --   --  2.37*  --  1.70*   < > = values in this interval not displayed.    Estimated Creatinine Clearance: 41.6 mL/min (A) (by C-G formula based on SCr of 1.7 mg/dL (H)).  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 Heparin  per pharmacy consult placed for hx of PE on apixaban  unable to take PO. Patient is on apixaban  prior to arrival. Last dose 9am 11/10. Will require aPTT monitoring due to likely falsely high anti-Xa level secondary to DOAC use. Hgb 13.9; plt 460  11/12: aPTT remains therapeutic at 87s with heparin  running at 1,200 units/hour. HL remains falsely therapeutic in light of recent apixaban  use. RN reports some blood in ileostomy tube overnight. Hgb drop 11.2>>8.9. Today, RN reports no additional blood.    Goal of Therapy:  Heparin  level 0.3-0.7 units/ml aPTT 66-102 seconds Monitor platelets by anticoagulation protocol: Yes   Plan:  Continue heparin  infusion at 1200 units/hr Continue to monitor via aPTT until levels are correlated Continue to  monitor H&H and platelets  Massie Fila, PharmD Clinical Pharmacist  09/23/2024 7:28 AM

## 2024-09-23 NOTE — Assessment & Plan Note (Signed)
 Midline abdominal wound s/p bowel perforation with open abdomen.  Well-appearing without erythema or drainage with clean bandage on top. - Wound care consult consulted, appreciate recs - Bandage change and wound care has signed off at this time.

## 2024-09-23 NOTE — Progress Notes (Signed)
 Daily Progress Note Intern Pager: [phone]  Patient name: Kristopher Thompson Medical record number: [id number] Date of birth: May 04, 1951 Age: 73 y.o. 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  Pertinent past medical history includes bladder cancer status post cystectomy with ileal conduit in 2018, h/o prostate cancer 2013 s/p prostatectomy, recent admission 05-05/2024 for incarcerated incisional, parastomal, and inguinal hernias with subsequent ileostomy, urostomy with ileal conduit revision, complicated by bowel perforation and septic shock requiring pressors and AKI requiring HD as well as urinary leak from conduit and b/l nephrostomy tube placement, 06/2024 admission for severe sepsis 2/2 abdominal wall infection, pyelonephritis and AKI with JP drain cultures +E coli and cornybacterium s/p removal 8/21, urine cultures +MRSA and E faecium, DM2, OSA, HTN, HFpEF CAD, RBBB, h/o PE on eliquis . 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) Inability to tolerate diet - Urology consulted, appreciate recommendations  - Continue ongoing volume resuscitation with IV fluids - Hematuria will probably continue since patient is on heparin  gtt - Trend hemoglobin; bleeding from urinary source very mild and most of hemoglobin drop is attributable to hemodilution - General Surgery consult to follow-up for increase in abdominal gas foci - General Surgery recommendations  - Operative abdominal wall I&D and exploration  for 4pm Thursday 11/13  - Begin loperamide  and FiberCon to help calm diarrhea and bulk stool - Antibiotics - received Rocephin  IV x1 in ED --> transition to Zosyn  dosed per pharmacy - Blood cultures NGTD - Urine culture + for E. coli - Continue pain control with pain control: Tylenol  1000 mg Q6H PRN, Oxycodone  5-10 mg Q6H PRN - Nausea control with PRN IV compazine   - AM Labs: CBC, BMP - Fall precautions - Daily weight - Delirium precautions - PT/OT consult, appreciate recommendations Orthostatic hypotension Chronic, on home midodrine  10mg  TID. Low inpatient blood pressures despite home midodrine  and IVF resuscitation.   - Increase midodrine  from 10mg  TID to 15mg  TID - Continue IVF resuscitation with additional bolus PRN Hypercalcemia Corrected serum calcium  12.5 today, will begin workup for etiology of hypercalcemia with the following labs: - PTH  - PTH RP - TSH Ab, T3, thyroid  US  Protein-calorie malnutrition, severe Secondary to chronic illnesses. - RD consult, appreciate recs  - Daily multivitamin  - Daily Juven BID to support wound healing   - Magic cup TID with meals  - Regular diet as tolerated Delayed wound healing Midline abdominal wound s/p bowel perforation with open abdomen.  Well-appearing without erythema or drainage with clean bandage on top. - Wound care consult consulted, appreciate recs - Bandage change and wound care has signed off at this time. Chronic health problem PE w/ Chronic anticoagulation : Hold Eliquis  5 mg tab BID.  Continue heparin  gtt.  Anxiety : Continue ATARAX  25 mg TID PRN, Zoloft  50 mg Daily  Insomnia: Continue Melatonin 3 mg at bedtime HLD : Continue CRESTOR  10 mg at bedtime Hypotensive : Continue Midodrine  10 mg TID w/ meal  High output ileostomy (HCC) Imodium  4 g TID GERD : Starting IV PPI DM II :  Monitor Daily BG w/ AM BMP  CKD stage 3b baseline GFR 30-44   OSA  CHF Hearing loss   FEN/GI: Clears with advance to regular as tolerated   PPx: Heparin  gtt Dispo:Home pending clinical improvement .   Subjective:  Patient was seen and evaluated at bedside. Wife present at bedside. Patient required compazine  overnight for nausea. This morning feeling better with pain well controlled, eating clears this morning without nausea or vomiting.   Objective: Temp:  [97.7 F (36.5 C)-98.6 F (37 C)] 98.6 F (37 C) (11/12 0525) Pulse Rate:  [69-88] 72 (11/12 0525) Resp:  [17-20] 17 (11/12 0525) BP: (84-104)/(51-62) 92/51 (11/12 0525) SpO2:  [97 %-100 %] 98 % (11/12 0525) Physical Exam: General: Patient sitting comfortably up in bed in no acute distress Cardiovascular: RRR, no M/R/G, 2+ radial pulses Respiratory: CTAB, normal work of breathing on room air, no W/R/R Abdomen: Soft, nontender, nondistended, midline wound well-healing under clean bandage, BS present, ileostomy bag full of watery brown diarrhea, nephrostomy bag with very small yellow urine Extremities: No peripheral edema, moves all extremities equally  Laboratory: Most recent CBC Lab Results  Component Value Date   WBC 7.0 09/23/2024   HGB 8.9 (L) 09/23/2024   HCT 27.0 (L) 09/23/2024   MCV 78.5 (L) 09/23/2024   PLT 217 09/23/2024   Most recent BMP    Latest Ref Rng & Units 09/23/2024    3:28 AM  BMP  Glucose 70 - 99 mg/dL 89   BUN 8 - 23 mg/dL 37   Creatinine 9.38 - 1.24 mg/dL 8.29   Sodium 864 - 854 mmol/L 138   Potassium 3.5 - 5.1 mmol/L 3.7   Chloride 98 - 111 mmol/L 111   CO2 22 - 32 mmol/L 17   Calcium  8.9 - 10.3 mg/dL 9.2     Lupie Credit, DO 09/23/2024, 7:42 AM  PGY-1, [facility] FPTS Intern pager: [phone], text pages welcome Secure chat group [facility] Teaching Service

## 2024-09-23 NOTE — Plan of Care (Addendum)
 FMTS Brief Progress Note  S: Went to evaluate patient at bedside. Per spouse, CCM has seen. He is asymptomatic.  O: BP (!) 82/42   Pulse 67   Temp 97.9 F (36.6 C)   Resp 20   Ht 5' 11 (1.803 m)   Wt 74.8 kg   SpO2 100%   BMI 23.01 kg/m    General: NAD, resting in bed Resp: Normal WOB on RA  A/P: Hypotension MAP 63 in room. - Complete LR bolus - Follow-up ICU recs - Will move to progressive floor if ICU does not rec ICU level care. - Orders reviewed. Labs for AM ordered, which was adjusted as needed.  - If condition changes, plan includes page family medicine teaching service.   Howell Lunger, DO 09/23/2024, 8:20 PM PGY-3, [facility] Night Resident  Please page [phone] with questions.

## 2024-09-23 NOTE — Assessment & Plan Note (Signed)
 Secondary to chronic illnesses. - RD consult, appreciate recs  - Daily multivitamin  - Daily Juven BID to support wound healing   - Magic cup TID with meals  - Regular diet as tolerated

## 2024-09-23 NOTE — Progress Notes (Signed)
 OT Cancellation Note  Patient Details Name: ZAKHARI FOGEL MRN: [id number] DOB: 02-May-1951   Cancelled Treatment:    Reason Eval/Treat Not Completed:  (Pt reported they wanted to eat lunch at this time. Will continue to follow.)  Hutton Pellicane K OTR/L  Acute Rehab Services  [phone] office number   Warrick Berber 09/23/2024, 11:45 AM

## 2024-09-23 NOTE — Assessment & Plan Note (Addendum)
 Corrected serum calcium  12.5 today, will begin workup for etiology of hypercalcemia with the following labs: - PTH  - PTH RP - TSH Ab, T3, thyroid  US 

## 2024-09-23 NOTE — Plan of Care (Addendum)
  Problem: Clinical Measurements: Goal: Respiratory complications will improve Outcome: Progressing   Problem: Nutrition: Goal: Adequate nutrition will be maintained Outcome: Progressing   Problem: Coping: Goal: Level of anxiety will decrease Outcome: Progressing   

## 2024-09-23 NOTE — Progress Notes (Addendum)
 Able to find operating time tomorrow.   Plan abdominal wall flank incision and drainage/debridement for Thursday 11/13 around 4 PM at [facility], operating room #5.    Patient can have solid diet today.   Clears after midnight.   N.p.o. after 5 AM.    I wrote PRN (as needed) IV fluid boluses just in case but will defer to primary on chronic IV fluids.  Would be helpful if heparin  is held 6 hours prior to surgery = hold 10 AM tomorrow  Will follow-up with patient in holding area tomorrow afternoon to clarify any further concerns  Elspeth KYM Schultze, MD, FACS, MASCRS Esophageal, Gastrointestinal & Colorectal Surgery Robotic and Minimally Invasive Surgery  [facility] [address], [address] [place], [place] [address] [phone] Fax [phone] Main  CONTACT INFORMATION: Weekday (9AM-5PM): Call [facility] main office at [phone] Weeknight (5PM-9AM) or Weekend/Holiday: Check EPIC Web Links tab & use AMION (password  [email or web address]) for General Surgery [facility] coverage  Please, DO NOT use SecureChat  (it is not reliable communication to reach operating surgeons & will lead to a delay in care).   Epic staff messaging available for outpatient concerns needing 1-2 business day response.

## 2024-09-23 NOTE — Assessment & Plan Note (Signed)
 PE w/ Chronic anticoagulation : Hold Eliquis  5 mg tab BID.  Continue heparin  gtt.  Anxiety : Continue ATARAX  25 mg TID PRN, Zoloft  50 mg Daily  Insomnia: Continue Melatonin 3 mg at bedtime HLD : Continue CRESTOR  10 mg at bedtime Hypotensive : Continue Midodrine  10 mg TID w/ meal  High output ileostomy (HCC) Imodium  4 g TID GERD : Starting IV PPI DM II : Monitor Daily BG w/ AM BMP  CKD stage 3b baseline GFR 30-44   OSA  CHF Hearing loss

## 2024-09-23 NOTE — Assessment & Plan Note (Signed)
 Chronic, on home midodrine  10mg  TID. Low inpatient blood pressures despite home midodrine  and IVF resuscitation.   - Increase midodrine  from 10mg  TID to 15mg  TID - Continue IVF resuscitation with additional bolus PRN

## 2024-09-23 NOTE — Progress Notes (Signed)
 OT Cancellation Note  Patient Details Name: Kristopher Thompson MRN: [id number] DOB: 06-23-1951   Cancelled Treatment:    Reason Eval/Treat Not Completed: Fatigue/lethargy limiting ability to participate (Pt reported they do not want to complete right now. Will follow up as able.)  Warrick POUR OTR/L  Acute Rehab Services  [phone] office number  Warrick Berber 09/23/2024, 9:43 AM

## 2024-09-23 NOTE — Plan of Care (Signed)
 FMTS Interim Progress Note  S: Patient seen at bedside after receiving message from nursing staff regarding persistent hypotension following LR bolus. On assessment, patient appears comfortable, in no acute distress, and without peripheral edema. Discussed with patient that if blood pressure does not improve, transfer to ICU may be necessary for closer monitoring and possible IV vasopressor support.  O: BP (!) 83/53 (BP Location: Right Arm)   Pulse (!) 55   Temp 97.7 F (36.5 C) (Oral)   Resp 19   Ht 5' 11 (1.803 m)   Wt 74.8 kg   SpO2 99%   BMI 23.01 kg/m     A/P: - Administer additional 1L LR bolus. - Midodrine  15 mg - Monitor MAP closely; if MAP <60 or any signs of clinical deterioration, contact ICU for possible transfer - Will continue to monitor closely  Elodie Palma, MD 09/23/2024, 4:09 PM PGY-1, [facility] Family Medicine Service pager [phone]

## 2024-09-24 ENCOUNTER — Inpatient Hospital Stay (HOSPITAL_COMMUNITY): Admitting: Anesthesiology

## 2024-09-24 ENCOUNTER — Encounter (HOSPITAL_COMMUNITY): Payer: Self-pay

## 2024-09-24 ENCOUNTER — Encounter (HOSPITAL_COMMUNITY)
Admission: EM | Disposition: A | Discharge status: Nursing Home (Includes ICF) | Payer: Self-pay | Source: Home / Self Care

## 2024-09-24 DIAGNOSIS — N179 Acute kidney failure, unspecified: Secondary | ICD-10-CM | POA: Diagnosis not present

## 2024-09-24 DIAGNOSIS — L02211 Cutaneous abscess of abdominal wall: Secondary | ICD-10-CM | POA: Diagnosis not present

## 2024-09-24 DIAGNOSIS — N1832 Chronic kidney disease, stage 3b: Secondary | ICD-10-CM

## 2024-09-24 DIAGNOSIS — G4730 Sleep apnea, unspecified: Secondary | ICD-10-CM | POA: Diagnosis not present

## 2024-09-24 DIAGNOSIS — E1122 Type 2 diabetes mellitus with diabetic chronic kidney disease: Secondary | ICD-10-CM

## 2024-09-24 DIAGNOSIS — R579 Shock, unspecified: Secondary | ICD-10-CM | POA: Diagnosis not present

## 2024-09-24 DIAGNOSIS — Z932 Ileostomy status: Secondary | ICD-10-CM | POA: Diagnosis not present

## 2024-09-24 HISTORY — PX: INCISION AND DRAINAGE ABSCESS: SHX5864

## 2024-09-24 LAB — TYPE AND SCREEN
ABO/RH(D): AB POS
Antibody Screen: NEGATIVE

## 2024-09-24 LAB — BASIC METABOLIC PANEL WITH GFR
Anion gap: 9 (ref 5–15)
Anion gap: 8 (ref 5–15)
BUN: 28 mg/dL — ABNORMAL HIGH (ref 8–23)
BUN: 21 mg/dL (ref 8–23)
CO2: 15 mmol/L — ABNORMAL LOW (ref 22–32)
CO2: 16 mmol/L — ABNORMAL LOW (ref 22–32)
Calcium: 8.7 mg/dL — ABNORMAL LOW (ref 8.9–10.3)
Calcium: 8.8 mg/dL — ABNORMAL LOW (ref 8.9–10.3)
Chloride: 115 mmol/L — ABNORMAL HIGH (ref 98–111)
Chloride: 112 mmol/L — ABNORMAL HIGH (ref 98–111)
Creatinine, Ser: 1.49 mg/dL — ABNORMAL HIGH (ref 0.61–1.24)
Creatinine, Ser: 1.5 mg/dL — ABNORMAL HIGH (ref 0.61–1.24)
GFR, Estimated: 50 mL/min — ABNORMAL LOW (ref >60)
GFR, Estimated: 49 mL/min — ABNORMAL LOW
Glucose, Bld: 93 mg/dL (ref 70–99)
Glucose, Bld: 96 mg/dL (ref 70–99)
Potassium: 3.1 mmol/L — ABNORMAL LOW (ref 3.5–5.1)
Potassium: 4.1 mmol/L (ref 3.5–5.1)
Sodium: 139 mmol/L (ref 135–145)
Sodium: 136 mmol/L (ref 135–145)

## 2024-09-24 LAB — CBC
HCT: 25.1 % — ABNORMAL LOW (ref 39.0–52.0)
Hemoglobin: 8 g/dL — ABNORMAL LOW (ref 13.0–17.0)
MCH: 25.4 pg — ABNORMAL LOW (ref 26.0–34.0)
MCHC: 31.9 g/dL (ref 30.0–36.0)
MCV: 79.7 fL — ABNORMAL LOW (ref 80.0–100.0)
Platelets: 214 K/uL (ref 150–400)
RBC: 3.15 MIL/uL — ABNORMAL LOW (ref 4.22–5.81)
RDW: 16.6 % — ABNORMAL HIGH (ref 11.5–15.5)
WBC: 6.9 K/uL (ref 4.0–10.5)
nRBC: 0 % (ref 0.0–0.2)

## 2024-09-24 LAB — SURGICAL PCR SCREEN
MRSA, PCR: NEGATIVE
Staphylococcus aureus: NEGATIVE

## 2024-09-24 LAB — MAGNESIUM
Magnesium: 1.5 mg/dL — ABNORMAL LOW (ref 1.7–2.4)
Magnesium: 2.1 mg/dL (ref 1.7–2.4)

## 2024-09-24 LAB — THYROTROPIN RECEPTOR AUTOABS: Thyrotropin Receptor Ab: <1.1 IU/L (ref 0.00–1.75)

## 2024-09-24 LAB — PHOSPHORUS
Phosphorus: 2.7 mg/dL (ref 2.5–4.6)
Phosphorus: 2.4 mg/dL — ABNORMAL LOW (ref 2.5–4.6)

## 2024-09-24 LAB — GLUCOSE, CAPILLARY: Glucose-Capillary: 74 mg/dL (ref 70–99)

## 2024-09-24 LAB — T3: T3, Total: 92 ng/dL (ref 71–180)

## 2024-09-24 LAB — APTT: aPTT: 107 s — ABNORMAL HIGH (ref 24–36)

## 2024-09-24 LAB — LACTIC ACID, PLASMA: Lactic Acid, Venous: 0.6 mmol/L (ref 0.5–1.9)

## 2024-09-24 LAB — HEPARIN LEVEL (UNFRACTIONATED): Heparin Unfractionated: 0.56 [IU]/mL (ref 0.30–0.70)

## 2024-09-24 SURGERY — INCISION AND DRAINAGE, ABSCESS
Anesthesia: General

## 2024-09-24 MED ORDER — LACTATED RINGERS IV SOLN: INTRAVENOUS | Status: DC

## 2024-09-24 MED ORDER — CHLORHEXIDINE GLUCONATE 0.12 % MT SOLN: 15.0000 mL | Freq: Once | OROMUCOSAL | Status: AC

## 2024-09-24 MED ORDER — FENTANYL CITRATE (PF) 250 MCG/5ML IJ SOLN
INTRAMUSCULAR | Status: DC | PRN
  Administered 2024-09-24: 50 ug via INTRAVENOUS
  Administered 2024-09-24 (×2): 25 ug via INTRAVENOUS

## 2024-09-24 MED ORDER — DEXAMETHASONE SOD PHOSPHATE PF 10 MG/ML IJ SOLN
INTRAMUSCULAR | Status: DC | PRN
  Administered 2024-09-24: 5 mg via INTRAVENOUS

## 2024-09-24 MED ORDER — HEPARIN (PORCINE) 25000 UT/250ML-% IV SOLN
1200.0000 [IU]/h | INTRAVENOUS | Status: DC
  Administered 2024-09-25: 1050 [IU]/h via INTRAVENOUS
  Administered 2024-09-26 – 2024-09-27 (×2): 1100 [IU]/h via INTRAVENOUS
  Administered 2024-09-28: 1200 [IU]/h via INTRAVENOUS
  Filled 2024-09-24 (×4): qty 250

## 2024-09-24 MED ORDER — CHLORHEXIDINE GLUCONATE CLOTH 2 % EX PADS: 6.0000 | MEDICATED_PAD | Freq: Once | CUTANEOUS | Status: DC

## 2024-09-24 MED ORDER — AMISULPRIDE (ANTIEMETIC) 5 MG/2ML IV SOLN: 10.0000 mg | Freq: Once | INTRAVENOUS | Status: DC | PRN

## 2024-09-24 MED ORDER — LIDOCAINE 2% (20 MG/ML) 5 ML SYRINGE
INTRAMUSCULAR | Status: AC
  Filled 2024-09-24: qty 5

## 2024-09-24 MED ORDER — FENTANYL CITRATE (PF) 100 MCG/2ML IJ SOLN
INTRAMUSCULAR | Status: AC
  Filled 2024-09-24: qty 2

## 2024-09-24 MED ORDER — LIDOCAINE 2% (20 MG/ML) 5 ML SYRINGE
INTRAMUSCULAR | Status: DC | PRN
Start: 2024-09-24 — End: 2024-09-24
  Administered 2024-09-24: 60 mg via INTRAVENOUS

## 2024-09-24 MED ORDER — ONDANSETRON HCL 4 MG/2ML IJ SOLN
INTRAMUSCULAR | Status: AC
Start: 2024-09-24 — End: 2024-09-24
  Filled 2024-09-24: qty 2

## 2024-09-24 MED ORDER — BUPIVACAINE-EPINEPHRINE 0.25% -1:200000 IJ SOLN
INTRAMUSCULAR | Status: DC | PRN
  Administered 2024-09-24: 10 mL

## 2024-09-24 MED ORDER — FENTANYL CITRATE (PF) 100 MCG/2ML IJ SOLN
25.0000 ug | INTRAMUSCULAR | Status: DC | PRN
  Administered 2024-09-24: 50 ug via INTRAVENOUS
  Administered 2024-09-24: 25 ug via INTRAVENOUS

## 2024-09-24 MED ORDER — ORAL CARE MOUTH RINSE: 15.0000 mL | Freq: Once | OROMUCOSAL | Status: AC

## 2024-09-24 MED ORDER — HEPARIN (PORCINE) 25000 UT/250ML-% IV SOLN: 1050.0000 [IU]/h | INTRAVENOUS | Status: DC

## 2024-09-24 MED ORDER — ROCURONIUM BROMIDE 10 MG/ML (PF) SYRINGE
PREFILLED_SYRINGE | INTRAVENOUS | Status: AC
  Filled 2024-09-24: qty 10

## 2024-09-24 MED ORDER — ACETAMINOPHEN 10 MG/ML IV SOLN
1000.0000 mg | Freq: Once | INTRAVENOUS | Status: DC | PRN
  Administered 2024-09-24: 1000 mg via INTRAVENOUS

## 2024-09-24 MED ORDER — ALBUMIN HUMAN 5 % IV SOLN: INTRAVENOUS | Status: DC | PRN

## 2024-09-24 MED ORDER — ONDANSETRON HCL 4 MG/2ML IJ SOLN
INTRAMUSCULAR | Status: DC | PRN
  Administered 2024-09-24: 4 mg via INTRAVENOUS

## 2024-09-24 MED ORDER — ACETAMINOPHEN 10 MG/ML IV SOLN
INTRAVENOUS | Status: AC
  Filled 2024-09-24: qty 100

## 2024-09-24 MED ORDER — PROPOFOL 10 MG/ML IV BOLUS
INTRAVENOUS | Status: DC | PRN
  Administered 2024-09-24: 200 mg via INTRAVENOUS

## 2024-09-24 MED ORDER — BUPIVACAINE-EPINEPHRINE (PF) 0.25% -1:200000 IJ SOLN
INTRAMUSCULAR | Status: AC
  Filled 2024-09-24: qty 30

## 2024-09-24 MED ORDER — CYCLOBENZAPRINE HCL 5 MG PO TABS: 5.0000 mg | ORAL_TABLET | Freq: Three times a day (TID) | ORAL | Status: DC | PRN

## 2024-09-24 MED ORDER — DIPHENOXYLATE-ATROPINE 2.5-0.025 MG PO TABS
1.0000 | ORAL_TABLET | Freq: Two times a day (BID) | ORAL | Status: DC
  Administered 2024-09-24 – 2024-09-26 (×4): 1 via ORAL
  Filled 2024-09-24 (×4): qty 1

## 2024-09-24 MED ORDER — LOPERAMIDE HCL 2 MG PO CAPS
4.0000 mg | ORAL_CAPSULE | Freq: Four times a day (QID) | ORAL | Status: DC
  Administered 2024-09-25 – 2024-09-27 (×10): 4 mg via ORAL
  Filled 2024-09-24 (×10): qty 2

## 2024-09-24 MED ORDER — POTASSIUM CHLORIDE 10 MEQ/100ML IV SOLN
10.0000 meq | INTRAVENOUS | Status: AC
  Administered 2024-09-24 (×6): 10 meq via INTRAVENOUS
  Filled 2024-09-24 (×6): qty 100

## 2024-09-24 MED ORDER — SODIUM CHLORIDE 0.9 % IV BOLUS
1000.0000 mL | Freq: Every day | INTRAVENOUS | Status: AC
  Administered 2024-09-24: 1000 mL via INTRAVENOUS

## 2024-09-24 MED ORDER — PROPOFOL 10 MG/ML IV BOLUS
INTRAVENOUS | Status: AC
  Filled 2024-09-24: qty 20

## 2024-09-24 MED ORDER — OXYCODONE HCL 5 MG PO TABS
5.0000 mg | ORAL_TABLET | ORAL | Status: DC | PRN
  Administered 2024-09-24: 5 mg via ORAL
  Administered 2024-09-25 – 2024-09-28 (×4): 10 mg via ORAL
  Administered 2024-09-28: 5 mg via ORAL
  Administered 2024-09-28: 10 mg via ORAL
  Administered 2024-09-30 (×2): 5 mg via ORAL
  Filled 2024-09-24 (×2): qty 2
  Filled 2024-09-24: qty 1
  Filled 2024-09-24 (×2): qty 2
  Filled 2024-09-24: qty 1
  Filled 2024-09-24: qty 2
  Filled 2024-09-24: qty 1
  Filled 2024-09-24 (×3): qty 2

## 2024-09-24 MED ORDER — LACTATED RINGERS IV BOLUS
500.0000 mL | Freq: Once | INTRAVENOUS | Status: AC
  Administered 2024-09-24: 500 mL via INTRAVENOUS

## 2024-09-24 MED ORDER — PHENYLEPHRINE 80 MCG/ML (10ML) SYRINGE FOR IV PUSH (FOR BLOOD PRESSURE SUPPORT)
PREFILLED_SYRINGE | INTRAVENOUS | Status: DC | PRN
Start: 2024-09-24 — End: 2024-09-24
  Administered 2024-09-24 (×2): 160 ug via INTRAVENOUS

## 2024-09-24 MED ORDER — KCL IN DEXTROSE-NACL 40-5-0.45 MEQ/L-%-% IV SOLN
INTRAVENOUS | Status: DC
  Filled 2024-09-24 (×2): qty 1000

## 2024-09-24 MED ORDER — BISMUTH SUBSALICYLATE 262 MG/15ML PO SUSP: 30.0000 mL | Freq: Three times a day (TID) | ORAL | Status: DC | PRN

## 2024-09-24 MED ORDER — LACTATED RINGERS IV BOLUS
1000.0000 mL | Freq: Once | INTRAVENOUS | Status: AC
  Administered 2024-09-24: 1000 mL via INTRAVENOUS

## 2024-09-24 MED ORDER — MAGNESIUM SULFATE 4 GM/100ML IV SOLN
4.0000 g | Freq: Once | INTRAVENOUS | Status: AC
  Administered 2024-09-24: 4 g via INTRAVENOUS
  Filled 2024-09-24: qty 100

## 2024-09-24 MED ORDER — CHLORHEXIDINE GLUCONATE 0.12 % MT SOLN
OROMUCOSAL | Status: AC
  Administered 2024-09-24: 15 mL via OROMUCOSAL
  Filled 2024-09-24: qty 15

## 2024-09-24 SURGICAL SUPPLY — 26 items
BENZOIN TINCTURE PRP APPL 2/3 (GAUZE/BANDAGES/DRESSINGS) ×1 IMPLANT
CANISTER SUCTION 3000ML PPV (SUCTIONS) IMPLANT
COVER SURGICAL LIGHT HANDLE (MISCELLANEOUS) ×1 IMPLANT
DRAPE LAPAROSCOPIC ABDOMINAL (DRAPES) IMPLANT
DRAPE LAPAROTOMY 100X72 PEDS (DRAPES) IMPLANT
ELECTRODE REM PT RTRN 9FT ADLT (ELECTROSURGICAL) ×1 IMPLANT
GAUZE PAD ABD 8X10 STRL (GAUZE/BANDAGES/DRESSINGS) IMPLANT
GAUZE SPONGE 4X4 12PLY STRL (GAUZE/BANDAGES/DRESSINGS) IMPLANT
GLOVE ECLIPSE 8.0 STRL XLNG CF (GLOVE) ×1 IMPLANT
GLOVE INDICATOR 8.0 STRL GRN (GLOVE) ×1 IMPLANT
GOWN STRL REUS W/ TWL LRG LVL3 (GOWN DISPOSABLE) ×1 IMPLANT
GOWN STRL REUS W/ TWL XL LVL3 (GOWN DISPOSABLE) ×1 IMPLANT
KIT BASIN OR (CUSTOM PROCEDURE TRAY) ×1 IMPLANT
KIT TURNOVER KIT B (KITS) ×1 IMPLANT
NDL HYPO 25GX1X1/2 BEV (NEEDLE) ×1 IMPLANT
NEEDLE HYPO 25GX1X1/2 BEV (NEEDLE) ×1 IMPLANT
PACK GENERAL/GYN (CUSTOM PROCEDURE TRAY) ×1 IMPLANT
PAD ARMBOARD POSITIONER FOAM (MISCELLANEOUS) ×2 IMPLANT
PENCIL SMOKE EVACUATOR (MISCELLANEOUS) ×1 IMPLANT
SOLN 0.9% NACL POUR BTL 1000ML (IV SOLUTION) ×1 IMPLANT
SPIKE FLUID TRANSFER (MISCELLANEOUS) ×1 IMPLANT
STRIP CLOSURE SKIN 1/2X4 (GAUZE/BANDAGES/DRESSINGS) ×1 IMPLANT
SUT MNCRL AB 4-0 PS2 18 (SUTURE) ×1 IMPLANT
SUT VIC AB 3-0 SH 27XBRD (SUTURE) ×1 IMPLANT
SYR CONTROL 10ML LL (SYRINGE) ×1 IMPLANT
TOWEL GREEN STERILE FF (TOWEL DISPOSABLE) ×1 IMPLANT

## 2024-09-24 NOTE — Transfer of Care (Signed)
 Immediate Anesthesia Transfer of Care Note  Patient: Kristopher Thompson  Procedure(s) Performed: INCISION AND DRAINAGE, ABSCESS  Patient Location: PACU  Anesthesia Type:General  Level of Consciousness: awake  Airway & Oxygen Therapy: Patient Spontanous Breathing and Patient connected to face mask oxygen  Post-op Assessment: Report given to RN and Post -op Vital signs reviewed and stable  Post vital signs: Reviewed and stable  Last Vitals:  Vitals Value Taken Time  BP 111/73 09/24/24 18:00  Temp    Pulse 76 09/24/24 18:02  Resp 17 09/24/24 18:02  SpO2 100 % 09/24/24 18:02  Vitals shown include unfiled device data.  Last Pain:  Vitals:   09/24/24 1554  TempSrc: Oral  PainSc: 1          Complications: No notable events documented.

## 2024-09-24 NOTE — Anesthesia Procedure Notes (Addendum)
 Procedure Name: LMA Insertion Date/Time: 09/24/2024 5:12 PM  Performed by: Julien Manus, CRNAPre-anesthesia Checklist: Patient identified, Emergency Drugs available, Suction available and Patient being monitored Patient Re-evaluated:Patient Re-evaluated prior to induction Oxygen Delivery Method: Circle System Utilized Preoxygenation: Pre-oxygenation with 100% oxygen Induction Type: IV induction Ventilation: Mask ventilation without difficulty LMA: LMA inserted LMA Size: 4.0 Number of attempts: 1 Airway Equipment and Method: Bite block Placement Confirmation: positive ETCO2 Tube secured with: Tape Dental Injury: Teeth and Oropharynx as per pre-operative assessment

## 2024-09-24 NOTE — Progress Notes (Signed)
 OT Cancellation Note  Patient Details Name: Kristopher Thompson MRN: [id number] DOB: 27-Dec-1950   Cancelled Treatment:    Reason Eval/Treat Not Completed: Patient at procedure or test/ unavailable   Niki Payment M. Burma, OTR/L [facility] Acute Rehabilitation Services [phone] Secure Chat Preferred  [name] 09/24/2024, 3:54 PM

## 2024-09-24 NOTE — Progress Notes (Signed)
 ANTICOAGULATION CONSULT NOTE  Pharmacy Consult for Heparin  Indication:  History of PE  Allergies  Allergen Reactions   Demerol  [Meperidine ] Nausea And Vomiting    Patient Measurements: Height: 5' 11 (180.3 cm) Weight: 72.6 kg (160 lb) IBW/kg (Calculated) : 75.3 Heparin  Dosing Weight: 74.8 kg  Vital Signs: Temp: 96.8 F (36 C) (11/13 1800) Temp Source: Oral (11/13 1554) BP: 96/57 (11/13 1830) Pulse Rate: 78 (11/13 1830)  Labs: Recent Labs    09/22/24 0442 09/22/24 1309 09/23/24 0328 09/23/24 2022 09/24/24 0220  HGB 11.2*  --  8.9* 8.5* 8.0*  HCT 34.2*  --  27.0* 26.6* 25.1*  PLT 332  --  217 225 214  APTT 75* 66* 87*  --  107*  HEPARINUNFRC >1.10*  --  0.89*  --  0.56  CREATININE 2.37*  --  1.70* 1.47* 1.49*    Estimated Creatinine Clearance: 46 mL/min (A) (by C-G formula based on SCr of 1.49 mg/dL (H)).  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 Heparin  per pharmacy consult placed for hx of PE on apixaban  unable to take PO. Last dose 9am on 11/10. Will require aPTT monitoring due to likely falsely high anti-Xa level secondary to DOAC use.   Heparin  stopped for I&D on 11/13. Per Dr. Sheldon, restart 12h post op with plans to resume 11/14 0600.  Goal of Therapy:  Heparin  level 0.3-0.7 units/ml aPTT 66-102 seconds Monitor platelets by anticoagulation protocol: Yes   Plan:  Start heparin  infusion to 1050 units/hr on 11/14 at 0600 8h heparin  level and aPTT Daily heparin  level, aPTT and CBC F/u bleeding and ability to transition back to oral anticoagulation  Thank you for allowing pharmacy to participate in this patient's care.  Leonor GORMAN Bash, PharmD Emergency Medicine Clinical Pharmacist 09/24/2024,6:42 PM

## 2024-09-24 NOTE — Anesthesia Preprocedure Evaluation (Addendum)
 Anesthesia Evaluation  Patient identified by MRN, date of birth, ID band Patient awake    Reviewed: Allergy & Precautions, NPO status , Patient's Chart, lab work & pertinent test results  Airway Mallampati: III  TM Distance: >3 FB Neck ROM: Full    Dental no notable dental hx.    Pulmonary sleep apnea and Continuous Positive Airway Pressure Ventilation , former smoker   Pulmonary exam normal        Cardiovascular +CHF  Normal cardiovascular exam  ECHO: 1. Left ventricular ejection fraction, by estimation, is 65 to 70%. The  left ventricle has hyperdynamic function. The left ventricle has no  regional wall motion abnormalities. Left ventricular diastolic parameters  are consistent with Grade I diastolic  dysfunction (impaired relaxation).   2. Right ventricular systolic function is normal. The right ventricular  size is not well visualized. There is normal pulmonary artery systolic  pressure. The estimated right ventricular systolic pressure is 29.4 mmHg.   3. Left atrial size was mildly dilated.   4. The mitral valve is grossly normal. No evidence of mitral valve  regurgitation. No evidence of mitral stenosis.   5. The aortic valve is tricuspid. There is mild calcification of the  aortic valve. There is mild thickening of the aortic valve. Aortic valve  regurgitation is not visualized. Aortic valve sclerosis/calcification is  present, without any evidence of  aortic stenosis.     Neuro/Psych  PSYCHIATRIC DISORDERS       Neuromuscular disease    GI/Hepatic Neg liver ROS, hiatal hernia,,,  Endo/Other  diabetes    Renal/GU Renal InsufficiencyRenal disease     Musculoskeletal   Abdominal   Peds  Hematology  (+) Blood dyscrasia (Eliquis ), anemia INR: 1.3   Anesthesia Other Findings RIGHT FLANK ABDOMINAL WALL ABSCESS  Reproductive/Obstetrics                              Anesthesia  Physical Anesthesia Plan  ASA: 3  Anesthesia Plan: General   Post-op Pain Management:    Induction: Intravenous  PONV Risk Score and Plan: 2 and Ondansetron , Dexamethasone  and Treatment may vary due to age or medical condition  Airway Management Planned: Oral ETT  Additional Equipment:   Intra-op Plan:   Post-operative Plan: Extubation in OR  Informed Consent: I have reviewed the patients History and Physical, chart, labs and discussed the procedure including the risks, benefits and alternatives for the proposed anesthesia with the patient or authorized representative who has indicated his/her understanding and acceptance.     Dental advisory given  Plan Discussed with: CRNA  Anesthesia Plan Comments:          Anesthesia Quick Evaluation

## 2024-09-24 NOTE — Progress Notes (Signed)
 Dear Doctor:  This patient has been identified as a candidate for PICC for the following reason (s): limited venous access, irritant medications and previous infiltrations. If you agree, please write an order for the indicated device. For any questions contact the Vascular Access Team at [phone] if no answer, please leave a message.  Thank you for supporting the early vascular access assessment program.

## 2024-09-24 NOTE — Progress Notes (Addendum)
 NAME:  Kristopher Thompson, MRN:  [id number], DOB:  January 16, 1951, LOS: 3 ADMISSION DATE:  09/21/2024, CONSULTATION DATE: 09/23/2024 REFERRING MD: Family medicine team, CHIEF COMPLAINT: Recurrent abdominal abscess  History of Present Illness:  [name] is a 73 year old male with a history complex medical history including bladder CA s/p cystectomy with ileal conduit-2018, prostate CA s/p prostatectomy, recent admission 7/25 for incarcerated incisional, parasternal parastomal and inguinal hernia with subsequent ileostomy, urostomy with ileal conduit revision, presents with nausea vomiting and abdominal pain and is being managed 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 Pending abdominal wall flank I&D and washout for 11/13 Critical care team is brought on board for persistent hypotension refractory to fluids  Pertinent  Medical History  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  Interim History / Subjective:  Patient on 6 mcg of levophed   Still having high output from ileostomy  Urostomy- adequate output   Objective    Blood pressure (!) 91/46, pulse 70, temperature 98.9 F (37.2 C), temperature source Axillary, resp. rate 19, height 5' 11 (1.803 m), weight 74.8 kg, SpO2 97%.        Intake/Output Summary (Last 24 hours) at 09/24/2024 0757 Last data filed at 09/24/2024 0645 Gross per 24 hour  Intake 2282.65 ml  Output 3050 ml  Net  -767.35 ml   Filed Weights   09/21/24 1155  Weight: 74.8 kg    Examination: General: acute on chronic older male, lying in ICU bed HEENT: Normocephalic, PERRLA intact, Pink MM CV: s1,s2, RRR, no MRG, No JVD  pulm: clear, diminished, no distress Abs: bs active, soft, ileostomy- high output, urostomy, midline dressing clean dry intact  Extremities: no edema, no deformity, moves all extremities on command  Skin: no rash  Neuro: Rass 0, responds to commands, alert and oriented x 4, hard of hearing GU: urostomy- urine   Resolved problem list   Assessment and Plan  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 High ostomy output Distributive shock Bladder CA s/p cystectomy with ileal conduit-2018 Prostate CA s/p prostatectomy Protein calorie malnutrition P: Continue to wean down levophed  as able  Plan for abdominal wall washout I&D on 11/13 Heparin  now on hold gtt  Urology and CCS following appreciate assistance  Continue imodium  and FiberCon  Continue Zosyn   Give an additional 1 LR bolus, monitor output  Give 4 grams of Magnesium   K being replaced  Recheck Mag/Phos/BMET   AKI 2.37> 1.49 Improving P: Continue to trend renal function daily  Continue to monitor and optimize electrolytes daily Continue to monitor urine output Continue strict I/Os Continue Adequate renal perfusion  Avoid nephrotoxic agents  Consider nephro consult  Labs   CBC: Recent Labs  Lab 09/21/24 1232 09/21/24 1526 09/22/24 0442 09/23/24 0328 09/23/24 2022 09/24/24 0220  WBC 12.7*  --  10.5 7.0 6.3 6.9  NEUTROABS 10.1*  --   --   --   --   --   HGB 13.1 13.9 11.2* 8.9* 8.5* 8.0*  HCT 41.5 41.0 34.2* 27.0* 26.6* 25.1*  MCV 79.5*  --  77.0* 78.5* 79.4* 79.7*  PLT 460*  --  332 217 225 214    Basic Metabolic Panel: Recent Labs  Lab 09/21/24 1232 09/21/24 1526 09/22/24 0442 09/23/24 0328 09/23/24 2022 09/24/24 0220  NA 132* 130* 134* 138 141 139  K 5.2* 5.2* 4.4 3.7  3.5 3.1*  CL 97* 104 105 111 116* 115*  CO2 20*  --  16* 17* 16* 15*  GLUCOSE 122* 113* 109* 89 103* 93  BUN 49* 60* 46* 37* 30* 28*  CREATININE 2.53* 2.80* 2.37* 1.70* 1.47* 1.49*  CALCIUM  14.2*  --  11.6* 9.2 8.7* 8.7*  MG 2.5*  --   --  1.7 1.5* 1.5*  PHOS  --   --  3.6 2.3*  --  2.7   GFR: Estimated Creatinine Clearance: 47.4 mL/min (A) (by C-G formula based on SCr of 1.49 mg/dL (H)). Recent Labs  Lab 09/21/24 1241 09/21/24 1453 09/22/24 0442 09/23/24 0328 09/23/24 2022 09/24/24 0220  WBC  --   --  10.5 7.0 6.3 6.9  LATICACIDVEN 1.6 1.9  --   --  1.3 0.6    Liver Function Tests: Recent Labs  Lab 09/21/24 1232 09/22/24 0442  AST 26 19  ALT 25 18  ALKPHOS 119 79  BILITOT 0.5 0.6  PROT 8.8* 6.6  ALBUMIN  4.2 2.9*   Recent Labs  Lab 09/21/24 1232  LIPASE 22   No results for input(s): AMMONIA in the last 168 hours.  ABG    Component Value Date/Time   PHART 7.28 (L) 04/21/2024 1715   PCO2ART 47 04/21/2024 1715   PO2ART 75 (L) 04/21/2024 1715   HCO3 14.2 (L) 05/26/2024 0423   TCO2 21 (L) 09/21/2024 1526   ACIDBASEDEF 10.1 (H) 05/26/2024 0423   O2SAT 90.5 05/26/2024 0423     Coagulation Profile: Recent Labs  Lab 09/21/24 1232  INR 1.3*    Cardiac Enzymes: No results for input(s): CKTOTAL, CKMB, CKMBINDEX, TROPONINI in the last 168 hours.  HbA1C: Hgb A1c MFr Bld  Date/Time Value Ref Range Status  04/01/2024 10:50 AM 5.3 4.8 - 5.6 % Final    Comment:    (NOTE) Pre diabetes:          5.7%-6.4%  Diabetes:              >6.4%  Glycemic control for   <7.0% adults with diabetes     CBG: Recent Labs  Lab 09/23/24 2116  GLUCAP 98   Christian Temekia Caskey AGACNP-BC   [facility] 09/24/2024, 11:50 AM  Please see [email or web address] for pager details.  From 7A-7P if no response, please call [phone]. After hours, please call ELink [phone].

## 2024-09-24 NOTE — Progress Notes (Signed)
 Physical Therapy Treatment Patient Details Name: Kristopher Thompson MRN: [id number] DOB: 31-Jul-1951 Today's Date: 09/24/2024   History of Present Illness 73 y.o. male presenting to [facility] with complaint of nausea/vomiting, weakness, abdominal pain, and failure to thrive; concern for fistula near urostomy and slight increase in right hydronephrosis; 11/12 transferred to ICU due to low BP PMHx: Admit 5/30-7/11 for hernia repair with bowel perforation and DC to snf, readmit 7/14-7/16 with D/C to CIR 7/16-8/8. HTN, dCHF, CAD, DM2, PE on Eliquis , anemia, obesity, prostate and bladder CA, hernia repair, urostomy, colostomy, RBBB    PT Comments  Pt recognized PT from initial hospitalization, and agreeable to work with therapy while waiting to go to OR this afternoon. Pt is grossly min Ax2 for safety with lines and drains. Pt able to come to EoB, stand take steps towards HoB, march in place x10, take seated rest break and stand again for marching x10. After which pt report increased fatigue. Returns to bed with minAx2. D/c plans remain appropriate at this time. PT will continue to follow acutely.     If plan is discharge home, recommend the following: A little help with walking and/or transfers;A little help with bathing/dressing/bathroom;Assist for transportation;Assistance with cooking/housework   Can travel by private vehicle      Yes  Equipment Recommendations  None recommended by PT       Precautions / Restrictions Precautions Precautions: Fall;Other (comment) (ileostomy, nephrology tube, drain) Precaution/Restrictions Comments: BPs soft, and with a drop in standing; Multiple tubes an dlines; L nephrostomy, RUQ ileostomy, urostomy Restrictions Weight Bearing Restrictions Per Provider Order: No     Mobility  Bed Mobility Overal bed mobility: Needs Assistance Bed Mobility: Sit to Supine     Supine to sit: Min assist, +2 for safety/equipment Sit to supine: Min assist, +2 for  safety/equipment   General bed mobility comments: min A for posterior support with coming to EoB, cues for sequencing, min A for managing LE back into bed at end of session.    Transfers Overall transfer level: Needs assistance Equipment used: Rolling walker (2 wheels) Transfers: Sit to/from Stand, Bed to chair/wheelchair/BSC Sit to Stand: Min assist, +2 safety/equipment          Lateral/Scoot Transfers: Min assist, +2 safety/equipment General transfer comment: minAx2 for power up and stepping towards HoB,        Balance Overall balance assessment: Mild deficits observed, not formally tested                                          Communication Communication Communication: Impaired Factors Affecting Communication: Hearing impaired  Cognition Arousal: Alert Behavior During Therapy: WFL for tasks assessed/performed   PT - Cognitive impairments: No apparent impairments                         Following commands: Intact      Cueing Cueing Techniques: Verbal cues  Exercises General Exercises - Lower Extremity Hip Flexion/Marching: AROM, Both, 15 reps, Standing    General Comments General comments (skin integrity, edema, etc.): unable to get accurate BP in standing due to tele box battery died. With return to bed 122/61, plan for return to OR this afternoon to drain abscess      Pertinent Vitals/Pain Pain Assessment Pain Assessment: Faces Faces Pain Scale: Hurts little more Pain Location: generalized with movement  Pain Descriptors / Indicators: Grimacing, Guarding Pain Intervention(s): Limited activity within patient's tolerance, Monitored during session, Repositioned     PT Goals (current goals can now be found in the care plan section) Acute Rehab PT Goals Patient Stated Goal: to get home and not get readmitted PT Goal Formulation: With patient Time For Goal Achievement: 10/06/24 Potential to Achieve Goals: Good Progress towards PT  goals: Progressing toward goals    Frequency    Min 2X/week       AM-PAC PT 6 Clicks Mobility   Outcome Measure  Help needed turning from your back to your side while in a flat bed without using bedrails?: None Help needed moving from lying on your back to sitting on the side of a flat bed without using bedrails?: A Little Help needed moving to and from a bed to a chair (including a wheelchair)?: A Little Help needed standing up from a chair using your arms (e.g., wheelchair or bedside chair)?: A Little Help needed to walk in hospital room?: Total Help needed climbing 3-5 steps with a railing? : Total 6 Click Score: 15    End of Session Equipment Utilized During Treatment: Gait belt Activity Tolerance: Patient tolerated treatment well Patient left: in bed;with call bell/phone within reach Nurse Communication: Mobility status PT Visit Diagnosis: Unsteadiness on feet (R26.81);Other abnormalities of gait and mobility (R26.89);Dizziness and giddiness (R42);Difficulty in walking, not elsewhere classified (R26.2)     Time: 8958-8897 PT Time Calculation (min) (ACUTE ONLY): 21 min  Charges:    $Therapeutic Exercise: 8-22 mins PT General Charges $$ ACUTE PT VISIT: 1 Visit                     Loanne Emery B. [name] PT, DPT Acute Rehabilitation Services Please use secure chat or  Call Office [phone]    [name] [name] 09/24/2024, 1:00 PM

## 2024-09-24 NOTE — Progress Notes (Signed)
 ANTICOAGULATION CONSULT NOTE  Pharmacy Consult for Heparin  Indication:  History of PE  Allergies  Allergen Reactions   Demerol  [Meperidine ] Nausea And Vomiting    Patient Measurements: Height: 5' 11 (180.3 cm) Weight: 74.8 kg (165 lb) IBW/kg (Calculated) : 75.3 Heparin  Dosing Weight: 74.8 kg  Vital Signs: Temp: 98.9 F (37.2 C) (11/13 0344) Temp Source: Axillary (11/13 0344) BP: 115/53 (11/13 0615) Pulse Rate: 67 (11/13 0615)  Labs: Recent Labs    09/21/24 1232 09/21/24 1526 09/22/24 0442 09/22/24 1309 09/23/24 0328 09/23/24 2022 09/24/24 0220  HGB 13.1   < > 11.2*  --  8.9* 8.5* 8.0*  HCT 41.5   < > 34.2*  --  27.0* 26.6* 25.1*  PLT 460*  --  332  --  217 225 214  APTT  --    < > 75* 66* 87*  --  107*  LABPROT 16.9*  --   --   --   --   --   --   INR 1.3*  --   --   --   --   --   --   HEPARINUNFRC  --    < > >1.10*  --  0.89*  --  0.56  CREATININE 2.53*   < > 2.37*  --  1.70* 1.47* 1.49*   < > = values in this interval not displayed.    Estimated Creatinine Clearance: 47.4 mL/min (A) (by C-G formula based on SCr of 1.49 mg/dL (H)).  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 Heparin  per pharmacy consult placed for hx of PE on apixaban  unable to take PO. Last dose 9am on 11/10. Will require aPTT monitoring due to likely falsely high anti-Xa level secondary to DOAC use.   aPTT supratherapeutic at 107 sec.  Lab drawn appropriately and no bleeding/hematuria observed per discussion with RN.  CBC stable.  Goal of Therapy:  Heparin  level 0.3-0.7 units/ml aPTT 66-102 seconds Monitor platelets by anticoagulation protocol: Yes   Plan:  Reduce heparin  infusion to 1050 units/hr >> off at 1000 for procedure F/U with resuming heparin  and repeat labs Daily heparin  level, aPTT and CBC  Ronnie Doo D. Lendell, PharmD, BCPS, BCCCP 09/24/2024, 7:27 AM

## 2024-09-24 NOTE — Op Note (Signed)
 09/21/2024 - 09/24/2024  5:45 PM  PATIENT:  Kristopher Thompson  73 y.o. male  Patient Care Team: [name], MD as PCP - General (Internal Medicine) [name], MD as Consulting Physician (Dermatology) [name], MD as Referring Physician (Pulmonary Disease) [name], MD as Consulting Physician (General Surgery) [name], MD as Consulting Physician (Urology)  PRE-OPERATIVE DIAGNOSIS:  RIGHT FLANK ABDOMINAL WALL SINUS TRACT, POSSIBLE ABSCESS  POST-OPERATIVE DIAGNOSIS:  RIGHT FLANK ABDOMINAL WALL SINUS TRACT, POSSIBLE ABSCESS  PROCEDURE:   EXCISION OF ABDOMINAL WALL SINUS TRACT WITH CULTURES  SURGEON:  [name], MD  ASSISTANT:  (n/a)   ANESTHESIA:  Laryngeal Mask Airway (LMA) and Local & regional field block at incision(s) for perioperative & postoperative pain control provided with 20mL of bupivicaine 0.25% with epinephrine   Estimated Blood Loss (EBL):   Total I/O In: 1786.5 [I.V.:127.9; IV Piggyback:1658.6] Out: 1100 [Urine:150; Drains:400; Stool:550].   (See anesthesia record)  Delay start of Pharmacological VTE agent (>24hrs) due to concerns of significant anemia, surgical blood loss, or risk of bleeding?:  Hold heparin  drip x 12 hours and resume 11/14 Friday a.m.   DRAINS: (None)  SPECIMEN: Right flank abdominal wall skin and subcutaneous tissue (old Blake drain chronic tract)  DISPOSITION OF SPECIMEN:  Pathology and Microbiology for culture & sensitivity  COUNTS:  Sponge, needle, & instrument counts CORRECT at the conclusion of the case.      PLAN OF CARE: Admit to inpatient   PATIENT DISPOSITION:  PACU - hemodynamically stable.  INDICATION: Patient with complicated surgical history.  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  Delayed ileal conduit leak with eventual  resolution.  Infected abdominal wall drain.  Drain removal with persistent drainage.  Concern for pain and discomfort and possible infection along the tract.  Not amenable to bedside treatment.  I recommended operative exploration with excision of tract and possible drainage of deeper abscess.    The anatomy and physiology of the region was discussed. The pathophysiology of subcutaneous abscess formation with progression to fasciitis & sepsis was discussed.  Need for incision, drainage, debridement discussed.  I stressed good hygiene & need for repeated wound care.  Possible redebridement / reoperation was discussed as well. Possibility of recurrence was discussed.   Risks of bleeding, infection, abscess, leak, injury to other organs, need for repair of tissues / organs, need for further treatment, heart attack, death, and other risks were discussed.  Benefits, alternatives were discussed. I noted a good likelihood this will help address the problem.  Questions answered.  The patient agrees to proceed.   OR FINDINGS: Right flank 4 mm punctate hole with scant serous thinly purulent drainage.  Tract excised down through right flank panniculus into subcutaneous tissues and onto fascia.  Phasix mesh in deep subcutaneous tissues with 4 x 4 cm region of exposure.  No evidence of any deep tissue necrosis or abscess.  No exposed fecal ileostomy nor ureteroileal urostomy conduit.  No evidence of urine or stool leak into the flank.    Resulted 5 x 4 cm wound at skin.  Tracks to a deeper pocket 8 x 7 at base.  Wound is 6 cm deep.  Packed with rolled Kerlix gauze   DESCRIPTION:    Informed consent was confirmed.  Patient underwent deep sedation with laryngeal mask airway and anesthesia.  Fecal and urine ostomies left in place.  Right flank prepped lateral and posterior to the ostomies.  Patient area was prepped and draped in sterile fashion.  Surgical timeout confirmed our plan.   I made an incision with scalpel  around the punctate opening on the right lateral flank panniculus..  I excised some thick and fibrous fatty tissue.  Ensured hemostasis.  Some brisk subcutaneous venous bleeding given his large panniculus - controlled.  Cored out the chronic draining sinus tract  down to deeper subcutaneous tissues.  No abscess purulence or pus pocket really noted.  Did come to base and there was some fold of exposed Phasix mesh at the level of fascia.  There was some thickening just superior to it but dissection noted this was just the absorbable Phasix mesh folded upon itself.      I encountered no necrosis nor abscess.  No evidence of purulence upon the mesh. I explored gently along the abdominal wall and between the fascia and the mesh gently several centimeters circumferentially encountered no pockets of necrosis nor infection.  Mesh mostly well incorporated except for were it was now exposed in the deep subcutaneous/ fascial layer.  I held off on more aggressive dissection since fecal urostomy and urine ileal conduit urostomy not that far away. I swabbed the mesh for culture and sent tissue for pathology  No evidence of any urine nor feculent drainage.  Hemostasis is good.  Dried up well.  No persistent drainage.  I packed the wound with 6 inch rolled Kerlix gauze soaked in a little bit of chlorhexidine  but mostly saline.  Dry ABD pad applied.  Patient tolerated procedure well.  Not on any pressors.  Hemodynamically stable.  Being sent to recovery room.  Will follow-up on cultures.  Will hold off on full heparin  anticoagulation tonight given some venous abdominal bleeders.  Make sure it is quiet.  Then restart in the morning.  Will start dressing changes tomorrow.  If cleans up well we will try and switch to a wound VAC in the hopes it can granulate in close faster like the midline incision wound did.   Patient stable in recovery room.  I made attempt to reach the patient's spouse, Damien Batty, without success.   Will try again later.     Elspeth KYM Schultze, MD, FACS, MASCRS Gastrointestinal and Minimally Invasive Surgery      [address] [place], [place] [address] [phone] Main / Paging [phone] Fax

## 2024-09-24 NOTE — Progress Notes (Signed)
 I discussed operative findings, updated the patient's status, discussed probable steps to recovery, and gave postoperative recommendations to the patient's spouse, Marval.  Recommendations were made.  Questions were answered.  She expressed understanding & appreciation.

## 2024-09-24 NOTE — Progress Notes (Signed)
 [facility] ADULT ICU REPLACEMENT PROTOCOL   The patient does apply for the [facility] Adult ICU Electrolyte Replacment Protocol based on the criteria listed below:   1.Exclusion criteria: TCTS, ECMO, Dialysis, and Myasthenia Gravis patients 2. Is GFR >/= 30 ml/min? Yes.    Patient's GFR today is 50 3. Is SCr </= 2? Yes.   Patient's SCr is 1.49 mg/dL 4. Did SCr increase >/= 0.5 in 24 hours? No. 5.Pt's weight >40kg  Yes.   6. Abnormal electrolyte(s): k 3.1, mg 1.5  7. Electrolytes replaced per protocol 8.  Call MD STAT for K+ </= 2.5, Phos </= 1, or Mag </= 1 Physician:    Veryl Abril A 09/24/2024 6:09 AM

## 2024-09-24 NOTE — Interval H&P Note (Signed)
 History and Physical Interval Note:  09/24/2024 4:48 PM  Ozell FORBES Merchant  has presented today for surgery, with the diagnosis of RIGHT FLANK ABDOMINAL WALL ABSCESS.  The various methods of treatment have been discussed with the patient and family. After consideration of risks, benefits and other options for treatment, the patient has consented to  Procedure(s) with comments: INCISION AND DRAINAGE, ABSCESS (N/A) - REMOVAL OF INCISION & DRAINAGE OF RIGHT FLANK ABDOMINAL WALL ABSCESS as a surgical intervention.  The patient's history has been reviewed, patient examined, no change in status, stable for surgery.  I have reviewed the patient's chart and labs.  Questions were answered to the patient's satisfaction.    I have re-reviewed the the patient's records, history, medications, and allergies.  I have re-examined the patient.  I again discussed intraoperative plans and goals of post-operative recovery.  The patient agrees to proceed.  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 08/08/2017 08/08/2017    Priority: Medium    Hematuria 09/23/2024   Protein-calorie malnutrition, severe 09/23/2024   Orthostatic hypotension 09/23/2024   Abdominal wall abscess 09/23/2024   Protein-calorie malnutrition, moderate 09/22/2024   Inability to tolerate diet 09/21/2024   Chronic health problem 09/21/2024   CKD (chronic kidney disease) 09/21/2024   Hypercalcemia 09/21/2024   Open wound 09/21/2024   Delayed wound healing 09/21/2024   Malnutrition of moderate degree 07/17/2024   Cognitive and  behavioral changes 07/15/2024   Gastro-esophageal reflux disease with esophagitis 07/08/2024   Hyponatremia 07/02/2024   Adjustment disorder with depressed mood 06/01/2024   Critical illness myopathy 05/27/2024   High output ileostomy (HCC) 05/26/2024   CKD stage 3b, GFR 30-44 ml/min (HCC) 05/26/2024   Ileostomy in place [facility]) 05/20/2024   H/O insertion of nephrostomy tube 05/20/2024   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 05/11/2024   Delayed bowel perforation s/p SBR/end ileostomy 04/29/2024   Pressure injury of skin 04/29/2024   Sinus tachycardia 04/13/2024   Tachypnea 04/13/2024   Acute respiratory insufficiency, postoperative 04/13/2024   Sepsis due to undetermined organism (HCC) 04/13/2024   Lactic acidosis 04/13/2024   Class 2 obesity 04/13/2024   Incarcerated incisional hernia 04/10/2024   Chronic anticoagulation 03/02/2024   Personal history of PE (pulmonary embolism) 03/02/2024   Irritant contact dermatitis associated with fecal stoma 05/28/2023   Non-recurrent bilateral inguinal hernia without obstruction or gangrene 05/28/2023   Obstructive sleep apnea of adult 11/21/2022   Post-traumatic arthritis of ankle, 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   Hearing loss 07/24/2021   History of bladder cancer 07/24/2021   E-coli UTI    Prolonged QT interval 12/07/2018   Hydronephrosis 12/07/2018   AKI (acute kidney injury) 06/15/2018   Fever 10/09/2017   Sepsis (HCC) 10/04/2017   GERD (gastroesophageal reflux disease) 08/11/2017   Obesity (BMI 35.0-39.9 without comorbidity) 08/11/2017   S/P ileal conduit (HCC) 08/08/2017    Past Medical History:  Diagnosis Date   At risk for sleep apnea    12-25-2017   STOP-BANG SCORE= 5   --- SENT TO PCP   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   moderate atypia - right low back   Atypical nevus 04/04/2007  moderate to marked - right upper back (wider shave)   Atypical nevus 04/04/2007   moderate atypia - center chest (wider shave)    Atypical nevus 04/04/2007   slight atypia - right thigh   Atypical nevus 11/29/2011   mild atypia - center upper back   Atypical nevus 11/29/2011   mild atypia - center chest   Bacteremia due to Klebsiella pneumoniae 10/09/2017   Bladder cancer (HCC) dx 07/2017   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  s/p  cystectomy w/ ileal conduit urinary diversion   Candida infection    CHF (congestive heart failure) (HCC)    Colostomy in place [facility])    since 08-08-2017-- per pt 12-25-2017 reddness around stoma   Diabetes mellitus without complication (HCC)    pt reports he was pre-DM/borderline, not taking any medications   GERD (gastroesophageal reflux disease)    H/O hiatal hernia    History of sepsis 09/2017   dx bacteremia due to klebsiella pneumoniae,  post op intraabdominal abscess   Prostate cancer [facility]) urologist-- dr renda   10-02-2012 s/p  prostatectomy-- Stage T1c   RBBB    Renal disorder    pt. denies   Sleep apnea    cpap   Squamous cell carcinoma of skin 05/22/2013   left cheek - CX3 + 5FU   Wears glasses     Past Surgical History:  Procedure Laterality Date   ABDOMINAL SURGERY     APPENDECTOMY  1972   BOWEL RESECTION N/A 04/20/2024   Procedure: SMALL BOWEL RESECTION;  Surgeon: [name], MD;  Location: [facility];  Service: General;  Laterality: N/A;   CHOLECYSTECTOMY  1985   COLONOSCOPY N/A 06/29/2021   Procedure: COLONOSCOPY;  Surgeon: Debby Hila, MD;  [facility];  Service: Endoscopy;  Laterality: N/A;   COLOSTOMY REVERSAL N/A 01/08/2018   Procedure: COLOSTOMY REVERSAL;  Surgeon: Debby Hila, MD;  [facility];  Service: General;  Laterality: N/A;   CYSTOSCOPY WITH RETROGRADE PYELOGRAM, URETEROSCOPY AND STENT PLACEMENT Right 06/10/2017   Procedure: CYSTOSCOPY WITH RIGHT URETEROSCOPY WITH RIGHT STENT PLACEMENT;  Surgeon: [name], MD;  [facility];  Service: Urology;  Laterality: Right;   EUS N/A 04/16/2018   Procedure: FULL UPPER  ENDOSCOPIC ULTRASOUND (EUS) RADIAL;  Surgeon: Burnette Fallow, MD;  [facility];  Service: Endoscopy;  Laterality: N/A;   FLEXIBLE SIGMOIDOSCOPY N/A 12/13/2017   Procedure: FLEXIBLE SIGMOIDOSCOPY;  Surgeon: Debby Hila, MD;  [facility];  Service: Endoscopy;  Laterality: N/A;   ILEO CONDUIT     IR CATHETER TUBE CHANGE  12/13/2017   IR CATHETER TUBE CHANGE  01/17/2018   IR CATHETER TUBE CHANGE  02/28/2018   IR CATHETER TUBE CHANGE  07/18/2018   IR CATHETER TUBE CHANGE  08/22/2018   IR CATHETER TUBE CHANGE  10/31/2018   IR CONVERT LEFT NEPHROSTOMY TO NEPHROURETERAL CATH  10/24/2017   IR EXT NEPHROURETERAL CATH EXCHANGE  05/19/2018   IR EXT NEPHROURETERAL CATH EXCHANGE  06/16/2018   IR EXT NEPHROURETERAL CATH EXCHANGE  07/09/2024   IR NEPHRO TUBE REMOV/FL  10/24/2017   IR NEPHRO TUBE REMOV/FL  08/31/2024   IR NEPHROSTOGRAM LEFT THRU EXISTING ACCESS  12/05/2018   IR NEPHROSTOGRAM RIGHT THRU EXISTING ACCESS  08/31/2024   IR NEPHROSTOMY EXCHANGE LEFT  05/11/2024   IR NEPHROSTOMY EXCHANGE LEFT  07/09/2024   IR NEPHROSTOMY EXCHANGE LEFT  08/31/2024   IR NEPHROSTOMY PLACEMENT LEFT  10/07/2017   IR NEPHROSTOMY PLACEMENT RIGHT  05/04/2024   IR NEPHROSTOMY  TUBE CHANGE  04/11/2018   IR URETERAL STENT PLACEMENT EXISTING ACCESS LEFT  05/13/2024   IR URETERAL STENT RIGHT NEW ACCESS W/SEP NEPHROSTOMY CATH  05/13/2024   LAPAROTOMY N/A 04/20/2024   Procedure: EXPLORATORY LAPAROTOMY;  Surgeon: [name], MD;  [facility];  Service: General;  Laterality: N/A;   PARTIAL COLECTOMY N/A 04/21/2024   Procedure: COLECTOMY, PARTIAL;  Surgeon: [name], MD;  [facility];  Service: General;  Laterality: N/A;  Removal Wound Vac, Washout Ostomy, Possible Anastomosis, Possible Ileostomy.  Phasix Mesh.   POLYPECTOMY  06/29/2021   Procedure: POLYPECTOMY;  Surgeon: Debby Hila, MD;  [facility];  Service: Endoscopy;;   ROBOT ASSISTED LAPAROSCOPIC RADICAL PROSTATECTOMY  10/02/2012    Procedure: ROBOTIC ASSISTED LAPAROSCOPIC RADICAL PROSTATECTOMY LEVEL 2;  Surgeon: Noretta Ferrara, MD;  [facility];  Service: Urology;  Laterality: N/A;   ROBOTIC ASSISTED LAPAROSCOPIC BLADDER DIVERTICULECTOMY N/A 08/08/2017   Procedure: XI ROBOTIC ASSISTED LAPAROSCOPIC RADICAL CYSTECTOMY COVERTED TO OPEN PELVIC LYMPHADNECTOMY BILATERAL AND ILEAL CONDUIT URINARY DIVERSION;  Surgeon: Ferrara Glance, MD;  [facility];  Service: Urology;  Laterality: N/A;   TRANSURETHRAL RESECTION OF BLADDER TUMOR  06/10/2017   Procedure: TRANSURETHRAL RESECTION OF BLADDER TUMOR (TURBT);  Surgeon: Ferrara Glance, MD;  [facility];  Service: Urology;;   VACUUM ASSISTED CLOSURE CHANGE N/A 04/20/2024   Procedure: PLACEMENT OF CONCETTA CAPES;  Surgeon: [name], MD;  [facility];  Service: General;  Laterality: N/A;   VENTRAL HERNIA REPAIR N/A 04/10/2024   Procedure: REPAIR, HERNIA, VENTRAL;  Surgeon: [name], MD;  [facility];  Service: General;  Laterality: N/A;   XI ROBOT ABDOMINAL PERINEAL RESECTION N/A 08/08/2017   Procedure: REPAIR OF RECTAL TEAR POSSIBLE PARTIAL PROCTECTOMY, CREATION OF  OSTOMY;  Surgeon: Debby Hila, MD;  [facility];  Service: General;  Laterality: N/A;   XI ROBOTIC ASSISTED PARASTOMAL HERNIA REPAIR N/A 04/10/2024   Procedure: REPAIR, HERNIA, PARASTOMAL AND VENTRAL HERNIAS, ROBOT-ASSISTED, LEFT INGUINAL HERNIA, LYSIS OF ADHESIONS INCARCERATED AND INSIONAL HERNIAS AND UROSTOMY REVISION;  Surgeon: [name], MD;  [facility];  Service: General;  Laterality: N/A;    Social History   Socioeconomic History   Marital status: Married    Spouse name: Not on file   Number of children: Not on file   Years of education: Not on file   Highest education level: Not on file  Occupational History   Not on file  Tobacco Use   Smoking status: Former    Current packs/day: 0.00    Types: Cigarettes    Start date: 12/24/1970    Quit date: 12/25/1979    Years since  quitting: 44.7   Smokeless tobacco: Never  Vaping Use   Vaping status: Never Used  Substance and Sexual Activity   Alcohol  use: Yes    Comment: rare   Drug use: No   Sexual activity: Yes  Other Topics Concern   Not on file  Social History Narrative   Not on file   Social Drivers of Health   Financial Resource Strain: Not on file  Food Insecurity: No Food Insecurity (09/22/2024)   Hunger Vital Sign    Worried About Running Out of Food in the Last Year: Never true    Ran Out of Food in the Last Year: Never true  Transportation Needs: No Transportation Needs (09/22/2024)   PRAPARE - Administrator, Civil Service (Medical): No    Lack of Transportation (Non-Medical): No  Physical  Activity: Not on file  Stress: Not on file  Social Connections: Socially Integrated (09/22/2024)   Social Connection and Isolation Panel    Frequency of Communication with Friends and Family: More than three times a week    Frequency of Social Gatherings with Friends and Family: More than three times a week    Attends Religious Services: More than 4 times per year    Active Member of [employer] or Organizations: Yes    Attends Banker Meetings: 1 to 4 times per year    Marital Status: Married  Catering Manager Violence: Not At Risk (09/22/2024)   Humiliation, Afraid, Rape, and Kick questionnaire    Fear of Current or Ex-Partner: No    Emotionally Abused: No    Physically Abused: No    Sexually Abused: No    Family History  Problem Relation Age of Onset   Lung cancer Mother    Hypertension Father    Colon cancer Other    CAD Neg Hx    Diabetes Neg Hx    Stroke Neg Hx     Medications Prior to Admission  Medication Sig Dispense Refill Last Dose/Taking   acetaminophen  (TYLENOL ) 325 MG tablet Take 1-2 tablets (325-650 mg total) by mouth every 4 (four) hours as needed for mild pain (pain score 1-3).   Past Week   cyanocobalamin  1000 MCG tablet Take 0.5 tablets (500 mcg total)  by mouth every Monday, Wednesday, and Friday. 30 tablet 0 09/18/2024   ELIQUIS  5 MG TABS tablet Take 1 tablet (5 mg total) by mouth 2 (two) times daily. 60 tablet 0 09/21/2024 at  9:00 AM   hydrOXYzine  (ATARAX ) 25 MG tablet Take 1 tablet (25 mg total) by mouth 3 (three) times daily as needed for anxiety. 30 tablet 0 Past Week   ipratropium (ATROVENT ) 0.03 % nasal spray Place 1 spray into both nostrils every 8 (eight) hours as needed (allergies).   Past Week   loperamide  (IMODIUM ) 2 MG capsule Take 2 capsules (4 mg total) by mouth 3 (three) times daily. 180 capsule 0 09/21/2024 Morning   melatonin 3 MG TABS tablet Take 1 tablet (3 mg total) by mouth at bedtime. 30 tablet 0 09/19/2024   midodrine  (PROAMATINE ) 5 MG tablet Take 2 tablets (10 mg total) by mouth 3 (three) times daily with meals. 180 tablet 0 09/21/2024 Morning   Nutritional Supplement LIQD Take 1 Bottle by mouth daily as needed (for snack).   Taking As Needed   ondansetron  (ZOFRAN -ODT) 4 MG disintegrating tablet Take 1 tablet (4 mg total) by mouth daily as needed for nausea or vomiting. 30 tablet 2 09/20/2024 Evening   Pediatric Multiple Vitamins (CHILDRENS MULTIVITAMIN) chewable tablet Chew 1 tablet by mouth 2 (two) times daily.   09/21/2024 Morning   polycarbophil (FIBERCON) 625 MG tablet Take 1 tablet (625 mg total) by mouth 2 (two) times daily. 60 tablet 0 09/19/2024   rosuvastatin  (CRESTOR ) 10 MG tablet Take 1 tablet (10 mg total) by mouth every evening. 30 tablet 0 09/19/2024   sertraline  (ZOLOFT ) 50 MG tablet Take 1 tablet (50 mg total) by mouth daily for 30 days, THEN 2 tablets (100 mg total) daily. 90 tablet 0 09/20/2024 Morning   sodium chloride  (OCEAN) 0.65 % SOLN nasal spray Place 1-2 sprays into both nostrils every 6 (six) hours as needed for congestion (dry nose).   Past Week    Current Facility-Administered Medications  Medication Dose Route Frequency Provider Last Rate Last Admin   0.9 %  sodium chloride  infusion  250 mL  Intravenous Continuous Payne, John D, PA-C       [MAR Hold] acetaminophen  (TYLENOL ) tablet 325-650 mg  325-650 mg Oral Q6H PRN Suknaim, Kulkaew B, DO   650 mg at 09/23/24 2321   [MAR Hold] Chlorhexidine  Gluconate Cloth 2 % PADS 6 each  6 each Topical Daily Sharie Bourbon, MD   6 each at 09/24/24 1450   Chlorhexidine  Gluconate Cloth 2 % PADS 6 each  6 each Topical Once [name], MD       And   Chlorhexidine  Gluconate Cloth 2 % PADS 6 each  6 each Topical Once [name], MD       [facility] Hold] cyanocobalamin  (VITAMIN B12) tablet 500 mcg  500 mcg Oral Q M,W,F Suknaim, Kulkaew B, DO   500 mcg at 09/23/24 9166   diphenoxylate -atropine  (LOMOTIL ) 2.5-0.025 MG per tablet 1 tablet  1 tablet Oral BID [name], MD       [MAR Hold] ferrous sulfate  tablet 325 mg  325 mg Oral BID WC [name], MD   325 mg at 09/24/24 0753   [MAR Hold] hydrOXYzine  (ATARAX ) tablet 25 mg  25 mg Oral TID PRN Suknaim, Kulkaew B, DO       [MAR Hold] ipratropium (ATROVENT ) 0.06 % nasal spray 1 spray  1 spray Each Nare Q8H PRN Suknaim, Kulkaew B, DO       [MAR Hold] lactated ringers  bolus 1,000 mL  1,000 mL Intravenous Q8H PRN [name], MD   Stopped at 09/23/24 1534   lactated ringers  infusion   Intravenous Continuous Leonce Athens, MD 10 mL/hr at 09/24/24 1600 New Bag at 09/24/24 1600   [MAR Hold] loperamide  (IMODIUM ) capsule 2-4 mg  2-4 mg Oral Q6H PRN [name], MD       [facility] Hold] loperamide  (IMODIUM ) capsule 4 mg  4 mg Oral Q6H [name], MD       [facility] Hold] melatonin tablet 3 mg  3 mg Oral QHS Suknaim, Kulkaew B, DO   3 mg at 09/23/24 2137   [facility] Hold] midodrine  (PROAMATINE ) tablet 15 mg  15 mg Oral TID WC Majeed, Sara, DO   15 mg at 09/24/24 1128   [MAR Hold] multivitamin with minerals tablet 1 tablet  1 tablet Oral Daily Anders Cumins T, MD   1 tablet at 09/24/24 1014   [MAR Hold] norepinephrine  (LEVOPHED ) 4mg  in (0.016 mg/mL) premix infusion  0-10 mcg/min Intravenous Titrated Emilio Norleen BIRCH, PA-C   Stopped at 09/24/24 1148   [MAR Hold] nutrition supplement (JUVEN) (JUVEN) powder packet 1 packet  1 packet Oral BID BM Anders Cumins DASEN, MD   1 packet at 09/23/24 1538   [MAR Hold] piperacillin -tazobactam (ZOSYN ) IVPB 3.375 g  3.375 g Intravenous Q8H Paytes, Austin A, RPH 12.5 mL/hr at 09/24/24 1530 Infusion Verify at 09/24/24 1530   [MAR Hold] polycarbophil (FIBERCON) tablet 625 mg  625 mg Oral BID Suknaim, Kulkaew B, DO   625 mg at 09/24/24 1014   [MAR Hold] prochlorperazine  (COMPAZINE ) injection 10 mg  10 mg Intravenous Q6H PRN Cleotilde Perkins, DO   10 mg at 09/23/24 2256   [MAR Hold] rosuvastatin  (CRESTOR ) tablet 10 mg  10 mg Oral QPM Suknaim, Kulkaew B, DO   10 mg at 09/23/24 1747   [MAR Hold] sertraline  (ZOLOFT ) tablet 50 mg  50 mg Oral Daily Suknaim, Kulkaew B, DO   50 mg at 09/24/24 1014   [MAR Hold] sodium chloride  (OCEAN) 0.65 %  nasal spray 1-2 spray  1-2 spray Each Nare Q6H PRN Suknaim, Kulkaew B, DO         Allergies  Allergen Reactions   Demerol  [Meperidine ] Nausea And Vomiting    BP 99/61   Pulse 62   Temp 97.6 F (36.4 C) (Oral)   Resp 19   Ht 5' 11 (1.803 m)   Wt 72.6 kg   SpO2 100%   BMI 22.32 kg/m   Labs: Results for orders placed or performed during the hospital encounter of 09/21/24 (from the past 48 hours)  Heparin  level (unfractionated)     Status: Abnormal   Collection Time: 09/23/24  3:28 AM  Result Value Ref Range   Heparin  Unfractionated 0.89 (H) 0.30 - 0.70 IU/mL    Comment: (NOTE) The clinical reportable range upper limit is being lowered to >1.10 to align with the FDA approved guidance for the current laboratory assay.  If heparin  results are below expected values, and patient dosage has  been confirmed, suggest follow up testing of antithrombin III levels. Performed at [facility], [address] [place], [place] [address]   APTT     Status: Abnormal   Collection Time: 09/23/24  3:28 AM  Result Value Ref Range   aPTT  87 (H) 24 - 36 seconds    Comment:        IF BASELINE aPTT IS ELEVATED, SUGGEST PATIENT RISK ASSESSMENT BE USED TO DETERMINE APPROPRIATE ANTICOAGULANT THERAPY. Performed at [facility], [address], [place], [place] [address]   CBC     Status: Abnormal   Collection Time: 09/23/24  3:28 AM  Result Value Ref Range   WBC 7.0 4.0 - 10.5 K/uL   RBC 3.44 (L) 4.22 - 5.81 MIL/uL   Hemoglobin 8.9 (L) 13.0 - 17.0 g/dL   HCT 72.9 (L) 60.9 - 47.9 %   MCV 78.5 (L) 80.0 - 100.0 fL   MCH 25.9 (L) 26.0 - 34.0 pg   MCHC 33.0 30.0 - 36.0 g/dL   RDW 83.5 (H) 88.4 - 84.4 %   Platelets 217 150 - 400 K/uL   nRBC 0.0 0.0 - 0.2 %    Comment: Performed at [facility], [address] [place], [place] [address]  VITAMIN D  25 Hydroxy (Vit-D Deficiency, Fractures)     Status: None   Collection Time: 09/23/24  3:28 AM  Result Value Ref Range   Vit D, 25-Hydroxy 35.24 30 - 100 ng/mL    Comment: (NOTE) Vitamin D  deficiency has been defined by the Institute of Medicine  and an Endocrine Society practice guideline as a level of serum 25-OH  vitamin D  less than 20 ng/mL (1,2). The Endocrine Society went on to  further define vitamin D  insufficiency as a level between 21 and 29  ng/mL (2).  1. IOM (Institute of Medicine). 2010. Dietary reference intakes for  calcium  and D. [place]: [employer]. 2. Holick MF, Binkley [place], Bischoff-Ferrari HA, et al. Evaluation,  treatment, and prevention of vitamin D  deficiency: an Endocrine  Society clinical practice guideline, JCEM. 2011 Jul; 96(7): 1911-30.  Performed at [facility], [address], [place], [place] [address]   TSH     Status: Abnormal   Collection Time: 09/23/24  3:28 AM  Result Value Ref Range   TSH <0.100 (L) 0.350 - 4.500 uIU/mL    Comment: Performed by a 3rd Generation assay with a functional sensitivity of <=0.01 uIU/mL. Performed  at [facility], [address], [place], [place] [address]    Basic metabolic panel     Status: Abnormal   Collection Time: 09/23/24  3:28 AM  Result Value Ref Range   Sodium 138 135 - 145 mmol/L   Potassium 3.7 3.5 - 5.1 mmol/L   Chloride 111 98 - 111 mmol/L   CO2 17 (L) 22 - 32 mmol/L   Glucose, Bld 89 70 - 99 mg/dL    Comment: Glucose reference range applies only to samples taken after fasting for at least 8 hours.   BUN 37 (H) 8 - 23 mg/dL   Creatinine, Ser 8.29 (H) 0.61 - 1.24 mg/dL   Calcium  9.2 8.9 - 10.3 mg/dL    Comment: DELTA CHECK NOTED   GFR, Estimated 42 (L) >60 mL/min    Comment: (NOTE) Calculated using the CKD-EPI Creatinine Equation (2021)    Anion gap 10 5 - 15    Comment: Performed at [facility], [address] [place], [place] [address]  Magnesium      Status: None   Collection Time: 09/23/24  3:28 AM  Result Value Ref Range   Magnesium  1.7 1.7 - 2.4 mg/dL    Comment: Performed at [facility], [address] [place], [place] [address]  Phosphorus     Status: Abnormal   Collection Time: 09/23/24  3:28 AM  Result Value Ref Range   Phosphorus 2.3 (L) 2.5 - 4.6 mg/dL    Comment: Performed at [facility], [address] [place], [place] [address]  Cortisol-am, blood     Status: None   Collection Time: 09/23/24  3:28 AM  Result Value Ref Range   Cortisol - AM 18.7 6.7 - 22.6 ug/dL    Comment: Performed at [facility], [address], [place], [place] [address]  T4, free     Status: Abnormal   Collection Time: 09/23/24  3:28 AM  Result Value Ref Range   Free T4 1.63 (H) 0.61 - 1.12 ng/dL    Comment: (NOTE) Biotin ingestion may interfere with free T4 tests. If the results are inconsistent with the TSH level, previous test results, or the clinical presentation, then consider biotin interference. If needed, order repeat testing after stopping biotin. Performed at [facility], [address], [place], [place] [address]   Thyrotropin receptor fanny     Status: None   Collection  Time: 09/23/24  1:28 PM  Result Value Ref Range   Thyrotropin Receptor Ab <1.10 0.00 - 1.75 IU/L    Comment: (NOTE) Performed At: [facility] [address] [place], [place] [id number] Jennette Shorter MD Ey:[id number]   T3     Status: None   Collection Time: 09/23/24  1:28 PM  Result Value Ref Range   T3, Total 92 71 - 180 ng/dL    Comment: (NOTE) Performed At: [facility] [address] [place], [place] [id number] Jennette Shorter MD Ey:[id number]   CBC     Status: Abnormal   Collection Time: 09/23/24  8:22 PM  Result Value Ref Range   WBC 6.3 4.0 - 10.5 K/uL   RBC 3.35 (L) 4.22 - 5.81 MIL/uL   Hemoglobin 8.5 (L) 13.0 - 17.0 g/dL   HCT 73.3 (L) 60.9 - 47.9 %   MCV 79.4 (L) 80.0 - 100.0 fL   MCH 25.4 (L) 26.0 - 34.0 pg   MCHC 32.0 30.0 - 36.0 g/dL   RDW 83.2 (H)  11.5 - 15.5 %   Platelets 225 150 - 400 K/uL   nRBC 0.0 0.0 - 0.2 %    Comment: Performed at [facility], [address] [place], [place] [address]  Basic metabolic panel     Status: Abnormal   Collection Time: 09/23/24  8:22 PM  Result Value Ref Range   Sodium 141 135 - 145 mmol/L   Potassium 3.5 3.5 - 5.1 mmol/L   Chloride 116 (H) 98 - 111 mmol/L   CO2 16 (L) 22 - 32 mmol/L   Glucose, Bld 103 (H) 70 - 99 mg/dL    Comment: Glucose reference range applies only to samples taken after fasting for at least 8 hours.   BUN 30 (H) 8 - 23 mg/dL   Creatinine, Ser 8.52 (H) 0.61 - 1.24 mg/dL   Calcium  8.7 (L) 8.9 - 10.3 mg/dL   GFR, Estimated 50 (L) >60 mL/min    Comment: (NOTE) Calculated using the CKD-EPI Creatinine Equation (2021)    Anion gap 9 5 - 15    Comment: Performed at [facility], [address] [place], [place] [address]  Magnesium      Status: Abnormal   Collection Time: 09/23/24  8:22 PM  Result Value Ref Range   Magnesium  1.5 (L) 1.7 - 2.4 mg/dL    Comment: Performed at [facility], [address] [place], [place] [address]  Lactic acid, plasma     Status: None    Collection Time: 09/23/24  8:22 PM  Result Value Ref Range   Lactic Acid, Venous 1.3 0.5 - 1.9 mmol/L    Comment: Performed at [facility] Lab, [address] [place], [place] [address]  Glucose, capillary     Status: None   Collection Time: 09/23/24  9:16 PM  Result Value Ref Range   Glucose-Capillary 98 70 - 99 mg/dL    Comment: Glucose reference range applies only to samples taken after fasting for at least 8 hours.  Heparin  level (unfractionated)     Status: None   Collection Time: 09/24/24  2:20 AM  Result Value Ref Range   Heparin  Unfractionated 0.56 0.30 - 0.70 IU/mL    Comment: (NOTE) The clinical reportable range upper limit is being lowered to >1.10 to align with the FDA approved guidance for the current laboratory assay.  If heparin  results are below expected values, and patient dosage has  been confirmed, suggest follow up testing of antithrombin III levels. Performed at [facility], [address] [place], [place] [address]   APTT     Status: Abnormal   Collection Time: 09/24/24  2:20 AM  Result Value Ref Range   aPTT 107 (H) 24 - 36 seconds    Comment:        IF BASELINE aPTT IS ELEVATED, SUGGEST PATIENT RISK ASSESSMENT BE USED TO DETERMINE APPROPRIATE ANTICOAGULANT THERAPY. Performed at [facility], [address] [place], [place] [address]   CBC     Status: Abnormal   Collection Time: 09/24/24  2:20 AM  Result Value Ref Range   WBC 6.9 4.0 - 10.5 K/uL   RBC 3.15 (L) 4.22 - 5.81 MIL/uL   Hemoglobin 8.0 (L) 13.0 - 17.0 g/dL   HCT 74.8 (L) 60.9 - 47.9 %   MCV 79.7 (L) 80.0 - 100.0 fL   MCH 25.4 (L) 26.0 - 34.0 pg   MCHC 31.9 30.0 - 36.0 g/dL   RDW 83.3 (H) 88.4 -  15.5 %   Platelets 214 150 - 400 K/uL   nRBC 0.0 0.0 - 0.2 %    Comment: Performed at [facility], [address] [place], [place] [address]  Basic metabolic panel with GFR     Status: Abnormal   Collection Time: 09/24/24  2:20 AM  Result Value Ref Range   Sodium  139 135 - 145 mmol/L   Potassium 3.1 (L) 3.5 - 5.1 mmol/L   Chloride 115 (H) 98 - 111 mmol/L   CO2 15 (L) 22 - 32 mmol/L   Glucose, Bld 93 70 - 99 mg/dL    Comment: Glucose reference range applies only to samples taken after fasting for at least 8 hours.   BUN 28 (H) 8 - 23 mg/dL   Creatinine, Ser 8.50 (H) 0.61 - 1.24 mg/dL   Calcium  8.7 (L) 8.9 - 10.3 mg/dL   GFR, Estimated 50 (L) >60 mL/min    Comment: (NOTE) Calculated using the CKD-EPI Creatinine Equation (2021)    Anion gap 9 5 - 15    Comment: Performed at [facility], [address], [place], [place] [address]  Magnesium      Status: Abnormal   Collection Time: 09/24/24  2:20 AM  Result Value Ref Range   Magnesium  1.5 (L) 1.7 - 2.4 mg/dL    Comment: Performed at [facility], [address], [place], [place] [address]  Phosphorus     Status: None   Collection Time: 09/24/24  2:20 AM  Result Value Ref Range   Phosphorus 2.7 2.5 - 4.6 mg/dL    Comment: Performed at [facility], [address] [place], [place] [address]  Lactic acid, plasma     Status: None   Collection Time: 09/24/24  2:20 AM  Result Value Ref Range   Lactic Acid, Venous 0.6 0.5 - 1.9 mmol/L    Comment: Performed at [facility], [address], [place], [place] [address]  Surgical pcr screen     Status: None   Collection Time: 09/24/24  7:36 AM   Specimen: Nasal Mucosa; Nasal Swab  Result Value Ref Range   MRSA, PCR NEGATIVE NEGATIVE   Staphylococcus aureus NEGATIVE NEGATIVE    Comment: (NOTE) The Xpert SA Assay (FDA approved for NASAL specimens in patients 44 years of age and older), is one component of a comprehensive surveillance program. It is not intended to diagnose infection nor to guide or monitor treatment. Performed at [facility], [address], [place], [place] [address]   Type and screen [facility]     Status: None (Preliminary result)   Collection Time: 09/24/24  4:05 PM  Result  Value Ref Range   ABO/RH(D) PENDING    Antibody Screen PENDING    Sample Expiration      09/27/2024,2359 Performed at [facility], [address], [place], [place] [address]     Imaging / Studies: US  THYROID  Result Date: 09/24/2024 CLINICAL DATA:  Hyperthyroid. EXAM: THYROID  ULTRASOUND TECHNIQUE: Ultrasound examination of the thyroid  gland and adjacent soft tissues was performed. COMPARISON:  None Available. FINDINGS: Parenchymal Echotexture: Normal Isthmus: 0.4 cm Right lobe: 5.3 x 2.7 x 1.6 cm Left lobe: 5.2 x 2.6 x 1.5 cm _________________________________________________________ Estimated total number of nodules >/= 1 cm: 0 Number of spongiform nodules >/=  2 cm not described below (TR1): 0 Number of mixed cystic and solid nodules >/= 1.5 cm not described below (TR2):  0 _________________________________________________________ No discrete nodules are seen within the thyroid  gland. No enlarged or abnormal appearing lymph nodes are identified. IMPRESSION: Mildly enlarged thyroid  gland with normal echotexture and no discrete nodules. Electronically Signed   By: Marcey Moan M.D.   On: 09/24/2024 08:42   CT CHEST ABDOMEN PELVIS WO CONTRAST Result Date: 09/21/2024 CLINICAL DATA:  Sepsis EXAM: CT CHEST, ABDOMEN AND PELVIS WITHOUT CONTRAST TECHNIQUE: Multidetector CT imaging of the chest, abdomen and pelvis was performed following the standard protocol without IV contrast. RADIATION DOSE REDUCTION: This exam was performed according to the departmental dose-optimization program which includes automated exposure control, adjustment of the mA and/or kV according to patient size and/or use of iterative reconstruction technique. COMPARISON:  CT 08/13/2024, 07/31/2024, 07/02/2024, and multiple prior exams FINDINGS: CT CHEST FINDINGS Cardiovascular: Limited evaluation without intravenous contrast. Moderate aortic atherosclerosis. No aneurysm. Multi vessel coronary vascular calcification. Normal  cardiac size. No pericardial effusion Mediastinum/Nodes: Patent trachea. Stable appearance of thyroid  gland with nodularity along the inferior isthmus, no specific imaging follow-up recommended. No suspiciously enlarged lymph nodes. Small hiatal hernia Lungs/Pleura: No acute airspace disease, pleural effusion or pneumothorax. Stable scattered small pulmonary nodules. Punctate 1-2 mm adjacent nodules in the right apex, series 5, image 42. Left upper lobe 3 mm pulmonary nodule series 5, image 48. 3 mm right middle lobe pulmonary nodule series 5, image 99. 3 mm left lung base pulmonary nodule series 5, image 118. Musculoskeletal: Sternum appears intact. No acute osseous abnormality. Mild chronic superior endplate deformity at T7. CT ABDOMEN PELVIS FINDINGS Hepatobiliary: Cholecystectomy. No focal hepatic abnormality or biliary dilatation. Pancreas: Multiple calcifications consistent with chronic pancreatitis. Atrophy. No inflammation. Stable 12 mm hypodense lesion at the distal pancreas on series 3, image 58. Spleen: Normal in size without focal abnormality. Adrenals/Urinary Tract: Stable adrenal glands. 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 Urothelial thickening of left renal pelvis. Mildly prominent left ureter, also with urothelial thickening. No obstructing stone on the left. Right ureteral stent with proximal pigtail in the right renal collecting system and distal pigtail at the right abdominal urostomy. Right-sided nephrostomy has been removed. Residual mild to moderate right hydronephrosis, stable to minimally increased. Increased stranding at the right renal pelvis compared to prior. Few gas bubbles in the slightly dilated distal right ureter, series 3 image 93 and 95. Stomach/Bowel: Stomach within normal limits. No dilated small bowel. Enteral contrast within the colon. 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 Vascular/Lymphatic: Aortic atherosclerosis. No suspicious lymph nodes Reproductive: Prostatectomy Other: Negative for ascites. Prominent ventral scar. Redemonstrated heterogenous collection within the right abdominal wall near the urostomy. Increased gas within the fluid collection compared to the most recent prior. This appears to communicate with a soft tissue tract in the right flank area, series 3, image 85 and coronal series 6, image 48 suggesting a fistula. Musculoskeletal: No acute osseous abnormality. Chronic superior endplate deformity at L1. IMPRESSION: 1. No CT evidence for acute intrathoracic abnormality. No acute airspace disease. 2. Status post cystoprostatectomy with right abdominal ileal conduit. Right ureteral stent in place with stable to minimally increased mild to moderate right hydronephrosis, post removal of right percutaneous nephrostomy. Slight increased stranding at the right renal pelvis and ureter compared to prior, with diffuse urothelial thickening of left renal pelvis and ureter, correlate for upper UTI. Left-sided percutaneous nephrostomy catheter remains in place and there is no left hydronephrosis. 3. Redemonstrated  heterogenous collection within the right abdominal wall near the urostomy. Increased gas within the fluid collection compared to the most recent prior with additional finding of small gas within the ileal conduit and within the distal right ureter adjacent to the stent. The heterogenous abdominal wall collection also appears to communicate with the right lateral flank skin surface as may be seen with a fistula. Increased gas within the collection compared to the prior exam could be secondary to superimposed infection, other consideration could include fistula or leak related to the urostomy. 4. Other chronic findings as previously described 5. Aortic atherosclerosis. Aortic Atherosclerosis  (ICD10-I70.0). Electronically Signed   By: Luke Bun M.D.   On: 09/21/2024 17:42   DG Chest 1 View Result Date: 09/21/2024 CLINICAL DATA:  Sepsis. EXAM: CHEST  1 VIEW COMPARISON:  Chest radiograph dated 08/04/2024. FINDINGS: No focal consolidation, pleural effusion, or pneumothorax. The cardiac silhouette is within normal limits. No acute osseous pathology. IMPRESSION: No active disease. Electronically Signed   By: Vanetta Chou M.D.   On: 09/21/2024 13:22   IR NEPHROSTOMY EXCHANGE LEFT Result Date: 08/31/2024 INDICATION: Routine exchange. LT PCN EXCHANGE AND RT NEPHROSTOGRAM AND REMOVAL OF CATH IF NO OBSTRUCTION; pcn removal EXAM: Procedures; 1. BILATERAL ANTEROGRADE NEPHROSTOGRAMS 2. LEFT NEPHROSTOMY TUBE EXCHANGE AND RIGHT NEPHROSTOMY TUBE REMOVAL 3. RIGHT NEPHROURETERAL CATHETER EXCHANGE COMPARISON:  CT AP, 08/13/2024.  IR fluoroscopy, 07/09/2024. CONTRAST:  15 mL Isovue -300 administered into the collecting system FLUOROSCOPY: Radiation Exposure Index and estimated peak skin dose (PSD); Reference air kerma (RAK), 17.7 mGy. COMPLICATIONS: None immediate. TECHNIQUE: Informed written consent was obtained from the patient after a discussion of the risks, benefits and alternatives to treatment. Questions regarding the procedure were encouraged and answered. A timeout was performed prior to the initiation of the procedure. The flanks, RIGHT lower quadrant and external portion of existing nephrostomy catheters were prepped and draped in the usual sterile fashion. A sterile drape was applied covering the operative field. Maximum barrier sterile technique with sterile gowns and gloves were used for the procedure. A timeout was performed prior to the initiation of the procedure. The procedure began at the patient's LEFT flank. A pre procedural spot fluoroscopic image was obtained after contrast was injected via the existing nephrostomy catheter demonstrating appropriate positioning within the renal pelvis.  The existing nephrostomy catheter was cut and cannulated with a Benson wire which was coiled within the renal pelvis. Under intermittent fluoroscopic guidance, the existing nephrostomy catheter was exchanged for a new 10 Fr drainage catheter. Contrast injection confirmed appropriate positioning within the renal pelvis and a post exchange fluoroscopic image was obtained. The catheter was locked and secured to the skin with a suture. Attention was then directed to the patient's RIGHT lower quadrant and RIGHT flank. Contrast was injected via the existing nephroureteral catheter demonstrating appropriate positioning. The nephroureteral catheter was cut and cannulated with an stiff glide wire which was coiled within the renal pelvis. The RIGHT nephrostomy catheter was removed under intermittent fluoroscopic guidance, then nephroureteral catheter was exchanged for a new 10 Fr, 45 cm all-purpose drainage catheter. Contrast injection confirmed appropriate positioning within the renal pelvis and a post exchange fluoroscopic image was obtained. Dressings were placed. The patient tolerated the procedure well without immediate postprocedural complication. FINDINGS: *The existing nephrostomy catheters and RIGHT nephroureteral catheter are appropriately positioned and functioning. *Successful removal of RIGHT nephrostomy catheter under intermittent fluoroscopic guidance. *After successful fluoroscopic guided exchange, the new LEFT nephrostomy and RIGHT nephroureteral catheters are coiled and locked within the  respective renal pelves. IMPRESSION: Successful fluoroscopic-removal of RIGHT nephrostomy, and exchanges of 10 Fr LEFT nephrostomy and RIGHT 10 Fr, 45 cm nephroureteral catheters. RECOMMENDATIONS: The patient will return to Vascular Interventional Radiology (VIR) for routine drainage catheter evaluation and exchange in 6-8 weeks. Thom Hall, MD [facility] Interventional Radiology Specialists [facility] Radiology  Electronically Signed   By: Thom Hall M.D.   On: 08/31/2024 16:19   IR NEPHROSTOGRAM RIGHT THRU EXISTING ACCESS Result Date: 08/31/2024 INDICATION: Routine exchange. LT PCN EXCHANGE AND RT NEPHROSTOGRAM AND REMOVAL OF CATH IF NO OBSTRUCTION; pcn removal EXAM: Procedures; 1. BILATERAL ANTEROGRADE NEPHROSTOGRAMS 2. LEFT NEPHROSTOMY TUBE EXCHANGE AND RIGHT NEPHROSTOMY TUBE REMOVAL 3. RIGHT NEPHROURETERAL CATHETER EXCHANGE COMPARISON:  CT AP, 08/13/2024.  IR fluoroscopy, 07/09/2024. CONTRAST:  15 mL Isovue -300 administered into the collecting system FLUOROSCOPY: Radiation Exposure Index and estimated peak skin dose (PSD); Reference air kerma (RAK), 17.7 mGy. COMPLICATIONS: None immediate. TECHNIQUE: Informed written consent was obtained from the patient after a discussion of the risks, benefits and alternatives to treatment. Questions regarding the procedure were encouraged and answered. A timeout was performed prior to the initiation of the procedure. The flanks, RIGHT lower quadrant and external portion of existing nephrostomy catheters were prepped and draped in the usual sterile fashion. A sterile drape was applied covering the operative field. Maximum barrier sterile technique with sterile gowns and gloves were used for the procedure. A timeout was performed prior to the initiation of the procedure. The procedure began at the patient's LEFT flank. A pre procedural spot fluoroscopic image was obtained after contrast was injected via the existing nephrostomy catheter demonstrating appropriate positioning within the renal pelvis. The existing nephrostomy catheter was cut and cannulated with a Benson wire which was coiled within the renal pelvis. Under intermittent fluoroscopic guidance, the existing nephrostomy catheter was exchanged for a new 10 Fr drainage catheter. Contrast injection confirmed appropriate positioning within the renal pelvis and a post exchange fluoroscopic image was obtained. The catheter  was locked and secured to the skin with a suture. Attention was then directed to the patient's RIGHT lower quadrant and RIGHT flank. Contrast was injected via the existing nephroureteral catheter demonstrating appropriate positioning. The nephroureteral catheter was cut and cannulated with an stiff glide wire which was coiled within the renal pelvis. The RIGHT nephrostomy catheter was removed under intermittent fluoroscopic guidance, then nephroureteral catheter was exchanged for a new 10 Fr, 45 cm all-purpose drainage catheter. Contrast injection confirmed appropriate positioning within the renal pelvis and a post exchange fluoroscopic image was obtained. Dressings were placed. The patient tolerated the procedure well without immediate postprocedural complication. FINDINGS: *The existing nephrostomy catheters and RIGHT nephroureteral catheter are appropriately positioned and functioning. *Successful removal of RIGHT nephrostomy catheter under intermittent fluoroscopic guidance. *After successful fluoroscopic guided exchange, the new LEFT nephrostomy and RIGHT nephroureteral catheters are coiled and locked within the respective renal pelves. IMPRESSION: Successful fluoroscopic-removal of RIGHT nephrostomy, and exchanges of 10 Fr LEFT nephrostomy and RIGHT 10 Fr, 45 cm nephroureteral catheters. RECOMMENDATIONS: The patient will return to Vascular Interventional Radiology (VIR) for routine drainage catheter evaluation and exchange in 6-8 weeks. Thom Hall, MD [facility]ascular and Interventional Radiology Specialists [facility] Radiology Electronically Signed   By: Thom Hall M.D.   On: 08/31/2024 16:19   IR Nephro Tube Remov/FL Result Date: 08/31/2024 INDICATION: Routine exchange. LT PCN EXCHANGE AND RT NEPHROSTOGRAM AND REMOVAL OF CATH IF NO OBSTRUCTION; pcn removal EXAM: Procedures; 1. BILATERAL ANTEROGRADE NEPHROSTOGRAMS 2. LEFT NEPHROSTOMY TUBE EXCHANGE AND RIGHT NEPHROSTOMY TUBE  REMOVAL 3. RIGHT NEPHROURETERAL  CATHETER EXCHANGE COMPARISON:  CT AP, 08/13/2024.  IR fluoroscopy, 07/09/2024. CONTRAST:  15 mL Isovue -300 administered into the collecting system FLUOROSCOPY: Radiation Exposure Index and estimated peak skin dose (PSD); Reference air kerma (RAK), 17.7 mGy. COMPLICATIONS: None immediate. TECHNIQUE: Informed written consent was obtained from the patient after a discussion of the risks, benefits and alternatives to treatment. Questions regarding the procedure were encouraged and answered. A timeout was performed prior to the initiation of the procedure. The flanks, RIGHT lower quadrant and external portion of existing nephrostomy catheters were prepped and draped in the usual sterile fashion. A sterile drape was applied covering the operative field. Maximum barrier sterile technique with sterile gowns and gloves were used for the procedure. A timeout was performed prior to the initiation of the procedure. The procedure began at the patient's LEFT flank. A pre procedural spot fluoroscopic image was obtained after contrast was injected via the existing nephrostomy catheter demonstrating appropriate positioning within the renal pelvis. The existing nephrostomy catheter was cut and cannulated with a Benson wire which was coiled within the renal pelvis. Under intermittent fluoroscopic guidance, the existing nephrostomy catheter was exchanged for a new 10 Fr drainage catheter. Contrast injection confirmed appropriate positioning within the renal pelvis and a post exchange fluoroscopic image was obtained. The catheter was locked and secured to the skin with a suture. Attention was then directed to the patient's RIGHT lower quadrant and RIGHT flank. Contrast was injected via the existing nephroureteral catheter demonstrating appropriate positioning. The nephroureteral catheter was cut and cannulated with an stiff glide wire which was coiled within the renal pelvis. The RIGHT nephrostomy catheter was removed under intermittent  fluoroscopic guidance, then nephroureteral catheter was exchanged for a new 10 Fr, 45 cm all-purpose drainage catheter. Contrast injection confirmed appropriate positioning within the renal pelvis and a post exchange fluoroscopic image was obtained. Dressings were placed. The patient tolerated the procedure well without immediate postprocedural complication. FINDINGS: *The existing nephrostomy catheters and RIGHT nephroureteral catheter are appropriately positioned and functioning. *Successful removal of RIGHT nephrostomy catheter under intermittent fluoroscopic guidance. *After successful fluoroscopic guided exchange, the new LEFT nephrostomy and RIGHT nephroureteral catheters are coiled and locked within the respective renal pelves. IMPRESSION: Successful fluoroscopic-removal of RIGHT nephrostomy, and exchanges of 10 Fr LEFT nephrostomy and RIGHT 10 Fr, 45 cm nephroureteral catheters. RECOMMENDATIONS: The patient will return to Vascular Interventional Radiology (VIR) for routine drainage catheter evaluation and exchange in 6-8 weeks. Thom Hall, MD [facility] and Interventional Radiology Specialists [facility] Radiology Electronically Signed   By: Thom Hall M.D.   On: 08/31/2024 16:19     .Elspeth KYM Schultze, M.D., F.A.C.S. Gastrointestinal and Minimally Invasive Surgery [facility] [address], [address] [place], [place] [address] [phone] Main / Paging  09/24/2024 4:48 PM    Elspeth JAYSON Schultze

## 2024-09-25 ENCOUNTER — Encounter (HOSPITAL_COMMUNITY): Payer: Self-pay | Admitting: Surgery

## 2024-09-25 DIAGNOSIS — R579 Shock, unspecified: Secondary | ICD-10-CM | POA: Diagnosis not present

## 2024-09-25 DIAGNOSIS — N179 Acute kidney failure, unspecified: Secondary | ICD-10-CM | POA: Diagnosis not present

## 2024-09-25 DIAGNOSIS — Z932 Ileostomy status: Secondary | ICD-10-CM | POA: Diagnosis not present

## 2024-09-25 DIAGNOSIS — E871 Hypo-osmolality and hyponatremia: Secondary | ICD-10-CM

## 2024-09-25 LAB — MAGNESIUM: Magnesium: 2 mg/dL (ref 1.7–2.4)

## 2024-09-25 LAB — BASIC METABOLIC PANEL WITH GFR
Anion gap: 6 (ref 5–15)
BUN: 19 mg/dL (ref 8–23)
CO2: 14 mmol/L — ABNORMAL LOW (ref 22–32)
Calcium: 8.8 mg/dL — ABNORMAL LOW (ref 8.9–10.3)
Chloride: 113 mmol/L — ABNORMAL HIGH (ref 98–111)
Creatinine, Ser: 1.5 mg/dL — ABNORMAL HIGH (ref 0.61–1.24)
GFR, Estimated: 49 mL/min — ABNORMAL LOW (ref >60)
Glucose, Bld: 178 mg/dL — ABNORMAL HIGH (ref 70–99)
Potassium: 4.8 mmol/L (ref 3.5–5.1)
Sodium: 133 mmol/L — ABNORMAL LOW (ref 135–145)

## 2024-09-25 LAB — PHOSPHORUS: Phosphorus: 2.3 mg/dL — ABNORMAL LOW (ref 2.5–4.6)

## 2024-09-25 LAB — CBC
HCT: 27.4 % — ABNORMAL LOW (ref 39.0–52.0)
Hemoglobin: 8.8 g/dL — ABNORMAL LOW (ref 13.0–17.0)
MCH: 25.5 pg — ABNORMAL LOW (ref 26.0–34.0)
MCHC: 32.1 g/dL (ref 30.0–36.0)
MCV: 79.4 fL — ABNORMAL LOW (ref 80.0–100.0)
Platelets: 252 K/uL (ref 150–400)
RBC: 3.45 MIL/uL — ABNORMAL LOW (ref 4.22–5.81)
RDW: 17.2 % — ABNORMAL HIGH (ref 11.5–15.5)
WBC: 10.1 K/uL (ref 4.0–10.5)
nRBC: 0 % (ref 0.0–0.2)

## 2024-09-25 LAB — APTT: aPTT: 57 s — ABNORMAL HIGH (ref 24–36)

## 2024-09-25 LAB — HEPARIN LEVEL (UNFRACTIONATED): Heparin Unfractionated: 0.32 [IU]/mL (ref 0.30–0.70)

## 2024-09-25 LAB — PSA: Prostatic Specific Antigen: <0.02 ng/mL (ref 0.00–4.00)

## 2024-09-25 MED ORDER — SODIUM PHOSPHATES 45 MMOLE/15ML IV SOLN
15.0000 mmol | Freq: Once | INTRAVENOUS | Status: AC
  Administered 2024-09-25: 15 mmol via INTRAVENOUS
  Filled 2024-09-25: qty 5

## 2024-09-25 MED ORDER — OYSTER SHELL CALCIUM/D3 500-5 MG-MCG PO TABS
1.0000 | ORAL_TABLET | Freq: Every day | ORAL | Status: DC
  Filled 2024-09-25: qty 1

## 2024-09-25 MED ORDER — KCL IN DEXTROSE-NACL 20-5-0.9 MEQ/L-%-% IV SOLN
INTRAVENOUS | Status: DC
  Filled 2024-09-25 (×7): qty 1000

## 2024-09-25 MED ORDER — ORAL CARE MOUTH RINSE: 15.0000 mL | OROMUCOSAL | Status: DC | PRN

## 2024-09-25 MED ORDER — LACTATED RINGERS IV BOLUS
500.0000 mL | Freq: Once | INTRAVENOUS | Status: AC
  Administered 2024-09-25: 500 mL via INTRAVENOUS

## 2024-09-25 MED ORDER — LACTATED RINGERS IV BOLUS
1000.0000 mL | Freq: Once | INTRAVENOUS | Status: AC
  Administered 2024-09-25: 1000 mL via INTRAVENOUS

## 2024-09-25 MED ORDER — FENTANYL CITRATE (PF) 50 MCG/ML IJ SOSY
12.5000 ug | PREFILLED_SYRINGE | Freq: Once | INTRAMUSCULAR | Status: AC
  Administered 2024-09-25: 12.5 ug via INTRAVENOUS
  Filled 2024-09-25: qty 1

## 2024-09-25 NOTE — Consult Note (Signed)
 WOC Nurse wound follow up Wound type: surgical Measurement: 5 cm x 3 cm x 4 cm with 6 cm undermining from 9 o'clock to 3 o'clock Wound bed: red, moist Drainage (amount, consistency, odor) serosanguinous Periwound: intact Dressing procedure/placement/frequency:  Removed old NPWT dressing Cleansed wound with normal saline  Filled wound with  _1___ piece of black foam  Sealed NPWT dressing at HG Patient received IV pain medication per bedside nurse prior to dressing change Patient tolerated procedure well  WOC nurse will continue to provide NPWT dressing changed due to the complexity of the dressing change.    Thank you,  Doyal Polite, MSN, RN, [facility] WOC Team [phone] (Available Mon-Fri 0700-1500)

## 2024-09-25 NOTE — Progress Notes (Signed)
 ANTICOAGULATION CONSULT NOTE  Pharmacy Consult for Heparin  Indication:  History of PE  Allergies  Allergen Reactions   Demerol  [Meperidine ] Nausea And Vomiting    Patient Measurements: Height: 5' 11 (180.3 cm) Weight: 72.6 kg (160 lb) IBW/kg (Calculated) : 75.3 Heparin  Dosing Weight: 74.8 kg  Vital Signs: Temp: 97.8 F (36.6 C) (11/14 1134) Temp Source: Oral (11/14 1134) BP: 105/65 (11/14 1500) Pulse Rate: 67 (11/14 1500)  Labs: Recent Labs    09/23/24 0328 09/23/24 2022 09/24/24 0220 09/24/24 1936 09/25/24 0224 09/25/24 1321  HGB 8.9* 8.5* 8.0*  --  8.8*  --   HCT 27.0* 26.6* 25.1*  --  27.4*  --   PLT 217 225 214  --  252  --   APTT 87*  --  107*  --   --  57*  HEPARINUNFRC 0.89*  --  0.56  --   --  0.32  CREATININE 1.70* 1.47* 1.49* 1.50* 1.50*  --     Estimated Creatinine Clearance: 45.7 mL/min (A) (by C-G formula based on SCr of 1.5 mg/dL (H)).  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 Heparin  per pharmacy consult placed for hx of PE on apixaban  unable to take PO. Last dose 9am on 11/10. Will require aPTT monitoring due to likely falsely high anti-Xa level secondary to DOAC use.   Heparin  stopped for I&D on 11/13. Per Dr. Sheldon, restart 12h post op with plans to resume 11/14 0600.  11/14 PM: Heparin  level and aPTT correlating so will use just heparin  level for next check. Heparin  level is low end of therapeutic, will minorly increase heparin  infusion and check next level with AM labs.   Goal of Therapy:  Heparin  level 0.3-0.7 units/ml aPTT 66-102 seconds Monitor platelets by anticoagulation protocol: Yes   Plan:  Increase heparin  infusion to 1100 units/hr Heparin  level with AM labs Daily heparin  level and CBC F/u bleeding and ability to transition back to oral anticoagulation  Thank you for allowing pharmacy to participate in this patient's care.  Larraine Brazier, PharmD Clinical Pharmacist 09/25/2024   3:47 PM **Pharmacist phone directory can now be found on [email or web address] ([email or web address]).  Listed under [facility].

## 2024-09-25 NOTE — Progress Notes (Signed)
 Physical Therapy Treatment Patient Details Name: Kristopher Thompson MRN: [id number] DOB: 03/20/1951 Today's Date: 09/25/2024   History of Present Illness 73 y.o. male presenting to [facility] with complaint of nausea/vomiting, weakness, abdominal pain, and failure to thrive; concern for fistula near urostomy and slight increase in right hydronephrosis; 11/12 transferred to ICU due to low BP PMHx: Admit 5/30-7/11 for hernia repair with bowel perforation and DC to snf, readmit 7/14-7/16 with D/C to CIR 7/16-8/8. S/p excision of abdominal wall with cultures 09/24/24. PMH includes HTN, dCHF, CAD, DM2, PE on Eliquis , anemia, obesity, prostate and bladder CA, hernia repair, urostomy, colostomy, RBBB    PT Comments  Pt reports soreness in abdomen. Therapy educated on egress from ICU bed at the foot to reduce rotation of the torso. Pt agreeable to try. Pt limited in mobility today but decreased BP in standing (see OT note). But is able to stand from bed with minAx2. Pt agreeable to sitting with bed in chair position. Patient will benefit from intensive inpatient follow-up therapy, >3 hours/day. PT will continue to follow acutely.   If plan is discharge home, recommend the following: A little help with walking and/or transfers;A little help with bathing/dressing/bathroom;Assist for transportation;Assistance with cooking/housework     Equipment Recommendations  None recommended by PT       Precautions / Restrictions Precautions Precautions: Fall;Other (comment) (ileostomy, nephrology tube, drain) Precaution/Restrictions Comments: BPs soft, and with a drop in standing; Multiple tubes an dlines; L nephrostomy, RUQ ileostomy, urostomy Restrictions Weight Bearing Restrictions Per Provider Order: No     Mobility  Bed Mobility Overal bed mobility: Needs Assistance             General bed mobility comments: Bed Egress used today    Transfers Overall transfer level: Needs  assistance Equipment used: Rolling walker (2 wheels) Transfers: Sit to/from Stand Sit to Stand: Min assist, +2 safety/equipment           General transfer comment: minAx2 for power up from bed in chair position to standing with RW          Balance Overall balance assessment: Mild deficits observed, not formally tested                                          Communication Communication Communication: Impaired Factors Affecting Communication: Hearing impaired  Cognition Arousal: Alert Behavior During Therapy: WFL for tasks assessed/performed, Flat affect                             Following commands: Intact      Cueing Cueing Techniques: Verbal cues     General Comments General comments (skin integrity, edema, etc.): pt with orthostatic hypotension, see OT note for values, pt wife in room supportive      Pertinent Vitals/Pain Pain Assessment Pain Assessment: Faces Faces Pain Scale: Hurts little more Pain Location: generalized with movement Pain Descriptors / Indicators: Grimacing, Guarding Pain Intervention(s): Limited activity within patient's tolerance, Monitored during session, Repositioned    Home Living Family/patient expects to be discharged to:: Private residence Living Arrangements: Spouse/significant other Available Help at Discharge: Family;Available 24 hours/day Type of Home: House Home Access: Stairs to enter Entrance Stairs-Rails: Can reach both Entrance Stairs-Number of Steps: 2 Alternate Level Stairs-Number of Steps: 14-16 stairs Home Layout: Two level;Able to live on main level  with bedroom/bathroom Home Equipment: Rollator (4 wheels);Rolling Walker (2 wheels)          PT Goals (current goals can now be found in the care plan section) Acute Rehab PT Goals PT Goal Formulation: With patient Time For Goal Achievement: 10/06/24 Potential to Achieve Goals: Good Progress towards PT goals: Progressing toward  goals    Frequency    Min 3X/week       Co-evaluation PT/OT/SLP Co-Evaluation/Treatment: Yes Reason for Co-Treatment: For patient/therapist safety;To address functional/ADL transfers PT goals addressed during session: Mobility/safety with mobility;Balance;Strengthening/ROM;Proper use of DME OT goals addressed during session: ADL's and self-care;Proper use of Adaptive equipment and DME;Strengthening/ROM      AM-PAC PT 6 Clicks Mobility   Outcome Measure  Help needed turning from your back to your side while in a flat bed without using bedrails?: A Lot Help needed moving from lying on your back to sitting on the side of a flat bed without using bedrails?: A Little Help needed moving to and from a bed to a chair (including a wheelchair)?: A Little       6 Click Score: 8    End of Session   Activity Tolerance: Patient tolerated treatment well Patient left: in bed;with call bell/phone within reach;with bed alarm set;Other (comment) (in chair position) Nurse Communication: Mobility status PT Visit Diagnosis: Unsteadiness on feet (R26.81);Other abnormalities of gait and mobility (R26.89);Dizziness and giddiness (R42);Difficulty in walking, not elsewhere classified (R26.2)     Time: 8960-8889 PT Time Calculation (min) (ACUTE ONLY): 31 min  Charges:    $Therapeutic Activity: 8-22 mins PT General Charges $$ ACUTE PT VISIT: 1 Visit                     Kristopher Thompson B. [name] PT, DPT Acute Rehabilitation Services Please use secure chat or  Call Office [phone]    Kristopher Thompson Fleeta [facility] 09/25/2024, 4:26 PM

## 2024-09-25 NOTE — Anesthesia Postprocedure Evaluation (Signed)
 Anesthesia Post Note  Patient: Kristopher Thompson  Procedure(s) Performed: INCISION AND DRAINAGE, ABSCESS     Patient location during evaluation: PACU Anesthesia Type: General Level of consciousness: awake Pain management: pain level controlled Vital Signs Assessment: post-procedure vital signs reviewed and stable Respiratory status: spontaneous breathing, nonlabored ventilation and respiratory function stable Cardiovascular status: blood pressure returned to baseline and stable Postop Assessment: no apparent nausea or vomiting Anesthetic complications: no   No notable events documented.  Last Vitals:  Vitals:   09/25/24 0245 09/25/24 0348  BP: (!) 98/57   Pulse: 65   Resp: 18   Temp:  36.4 C  SpO2: 97%     Last Pain:  Vitals:   09/25/24 0348  TempSrc: Axillary  PainSc:                  [name]

## 2024-09-25 NOTE — Progress Notes (Signed)
 Pt complaining of severe pain in his left forearm. Levo was running through an IV in his left forearm. Levo was stopped, blood return noted from IV, however, pt could not stand for it to be flushed. There is redness above the IV where a pt says an IV was removed yesterday. Redness was marked, NP Sherlean Sharps made aware, and painful IV removed. Will continue to monitor.

## 2024-09-25 NOTE — Progress Notes (Signed)
 NAME:  Kristopher Thompson, MRN:  [id number], DOB:  Nov 10, 1951, LOS: 4 ADMISSION DATE:  09/21/2024, CONSULTATION DATE: 09/23/2024 REFERRING MD: Family medicine team, CHIEF COMPLAINT: Recurrent abdominal abscess  History of Present Illness:  [name] is a 73 year old male with a history complex medical history including bladder CA s/p cystectomy with ileal conduit-2018, prostate CA s/p prostatectomy, recent admission 7/25 for incarcerated incisional, parasternal parastomal and inguinal hernia with subsequent ileostomy, urostomy with ileal conduit revision, presents with nausea vomiting and abdominal pain and is being managed 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 Pending abdominal wall flank I&D and washout for 11/13 Critical care team is brought on board for persistent hypotension refractory to fluids  Pertinent  Medical History  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  Interim History / Subjective:  Patient on 6 mcg of levophed   Still having high output from ileostomy  Urostomy- adequate output   Objective    Blood pressure 104/69, pulse (!) 59, temperature 98 F (36.7 C), temperature source Oral, resp. rate 17, height 5' 11 (1.803 m), weight 72.6 kg, SpO2 99%.        Intake/Output Summary (Last 24 hours) at 09/25/2024 1009 Last data filed at 09/25/2024 1000 Gross per 24 hour  Intake 3722.47 ml  Output 2750 ml  Net 972.47  ml   Filed Weights   09/21/24 1155 09/24/24 1554  Weight: 74.8 kg 72.6 kg    Examination: General: acute on chronic older adult male, sitting up in ICU bed, in NAD HEENT: Normocephalic, PERRLA intact, Pink MM CV: s1,s2, RRR, no MRG, No JVD  pulm: clear, diminished, no distress  Abs: bs active, soft, ileostomy- high output, urostomy- producing urine, midline dressing clean dry intact  Extremities: no edema, no deformity, moves all extremities on command  Skin: no rash, left forearm phlebitis- improving  Neuro: Rass 0, alert, oriented x 4  GU: urostomy   Resolved problem list   Assessment and Plan  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 s/p excision of abdominal wall sinus tract (11/13)  High ostomy output Distributive shock Bladder CA s/p cystectomy with ileal conduit-2018 Prostate CA s/p prostatectomy Protein calorie malnutrition P: Continue to wean levophed , requiring 2 mcg of levo Heparin  gtt restarted after I&D for abdominal wall abscess Follow up with cultures sent- gram negative rods seen, wait for Culture ID to result, continue Zosyn    Continue to follow BCx2  Continue immodium and FiberCon Give an additional 500 cc LR Bolus  Start on D5NS, 20 meqK  Continue to trend electrolytes, continue to replace as needed   Hypercalcemia- resolved  Hypophosphatemia Received Calcitonin on 11/1, which did decrease calcium  levels PTH < 6, Vitamin D  Levels in normal limits  Suspect related to AKI but Patient has had history of bladder/prostate cancer  P:  Continue to trend Calcium  levels  Obtain PSA level, follow- if elevated will need MRI of spine- maybe even PET scan Sodium phos ordered   Left forearm Phlebitis  Appears to be improving This is not where levophed  was infusing according  to RNs, IV removed  P: Continue to monitor Elevate, warm compress if needed   AKI Hyponatremia 2.37> 1.49>1.50  Improving P: Continue to trend renal function daily   Continue to monitor and optimize electrolytes daily Continue to monitor urine output Continue strict I/Os Continue Adequate renal perfusion  Avoid nephrotoxic agents  Consider nephro consult Starting d5NS with 20 meqK   Labs   CBC: Recent Labs  Lab 09/21/24 1232 09/21/24 1526 09/22/24 0442 09/23/24 0328 09/23/24 2022 09/24/24 0220 09/25/24 0224  WBC 12.7*  --  10.5 7.0 6.3 6.9 10.1  NEUTROABS 10.1*  --   --   --   --   --   --   HGB 13.1   < > 11.2* 8.9* 8.5* 8.0* 8.8*  HCT 41.5   < > 34.2* 27.0* 26.6* 25.1* 27.4*  MCV 79.5*  --  77.0* 78.5* 79.4* 79.7* 79.4*  PLT 460*  --  332 217 225 214 252   < > = values in this interval not displayed.    Basic Metabolic Panel: Recent Labs  Lab 09/22/24 0442 09/23/24 0328 09/23/24 2022 09/24/24 0220 09/24/24 1936 09/25/24 0224  NA 134* 138 141 139 136 133*  K 4.4 3.7 3.5 3.1* 4.1 4.8  CL 105 111 116* 115* 112* 113*  CO2 16* 17* 16* 15* 16* 14*  GLUCOSE 109* 89 103* 93 96 178*  BUN 46* 37* 30* 28* 21 19  CREATININE 2.37* 1.70* 1.47* 1.49* 1.50* 1.50*  CALCIUM  11.6* 9.2 8.7* 8.7* 8.8* 8.8*  MG  --  1.7 1.5* 1.5* 2.1 2.0  PHOS 3.6 2.3*  --  2.7 2.4* 2.3*   GFR: Estimated Creatinine Clearance: 45.7 mL/min (A) (by C-G formula based on SCr of 1.5 mg/dL (H)). Recent Labs  Lab 09/21/24 1241 09/21/24 1453 09/22/24 0442 09/23/24 0328 09/23/24 2022 09/24/24 0220 09/25/24 0224  WBC  --   --    < > 7.0 6.3 6.9 10.1  LATICACIDVEN 1.6 1.9  --   --  1.3 0.6  --    < > = values in this interval not displayed.    Liver Function Tests: Recent Labs  Lab 09/21/24 1232 09/22/24 0442  AST 26 19  ALT 25 18  ALKPHOS 119 79  BILITOT 0.5 0.6  PROT 8.8* 6.6  ALBUMIN  4.2 2.9*   Recent Labs  Lab 09/21/24 1232  LIPASE 22   No results for input(s): AMMONIA in the last 168 hours.  ABG    Component Value Date/Time   PHART 7.28 (L) 04/21/2024 1715   PCO2ART 47 04/21/2024 1715   PO2ART 75 (L) 04/21/2024 1715   HCO3 14.2  (L) 05/26/2024 0423   TCO2 21 (L) 09/21/2024 1526   ACIDBASEDEF 10.1 (H) 05/26/2024 0423   O2SAT 90.5 05/26/2024 0423     Coagulation Profile: Recent Labs  Lab 09/21/24 1232  INR 1.3*    Cardiac Enzymes: No results for input(s): CKTOTAL, CKMB, CKMBINDEX, TROPONINI in the last 168 hours.  HbA1C: Hgb A1c MFr Bld  Date/Time Value Ref Range Status  04/01/2024 10:50 AM 5.3 4.8 - 5.6 % Final    Comment:    (NOTE) Pre diabetes:          5.7%-6.4%  Diabetes:              >6.4%  Glycemic control for   <7.0% adults with diabetes     CBG: Recent Labs  Lab 09/23/24 2116 09/24/24 1758  GLUCAP 98 74    CC:  45 mins   Sherlean Sharps AGACNP-BC   [facility] 09/25/2024, 10:09 AM  Please see [email or web address] for pager details.  From 7A-7P if no response, please call [phone]. After hours, please call ELink [phone].

## 2024-09-25 NOTE — Evaluation (Signed)
 Occupational Therapy Evaluation Patient Details Name: Kristopher Thompson MRN: [id number] DOB: 07-01-1951 Today's Date: 09/25/2024   History of Present Illness   73 y.o. male presenting to [facility] with complaint of nausea/vomiting, weakness, abdominal pain, and failure to thrive; concern for fistula near urostomy and slight increase in right hydronephrosis; 11/12 transferred to ICU due to low BP PMHx: Admit 5/30-7/11 for hernia repair with bowel perforation and DC to snf, readmit 7/14-7/16 with D/C to CIR 7/16-8/8. S/p excision of abdominal wall with cultures 09/24/24. PMH includes HTN, dCHF, CAD, DM2, PE on Eliquis , anemia, obesity, prostate and bladder CA, hernia repair, urostomy, colostomy, RBBB     Clinical Impressions Pt Is typically mod I with RW, he sponge bathes and dresses himself. He is a retired education officer, community. He likes to listen to [employer]. Today he is limited by orthostatic pressures, but is very motivated to participate with therapies. He is currently max to total A for LB ADL mod to set up for UB ADL. ICU bed used egress setting to assist with sit<>stand transfers and marching in place. At this time recommend return to rehab of >3 hours daily to maximize safety and independence in ADL and functional transfers.   Vital Signs During Session: Supine: 95/59 HR 67 Bed in chair position: 98/54 HR 66 Standing: 96/62 HR 102 Started to get light headed and dizzy return to seated position Sitting: 86/49 HR 89 Sitting after 3 min: 95/53 HR 70     If plan is discharge home, recommend the following:   Two people to help with walking and/or transfers;A lot of help with bathing/dressing/bathroom     Functional Status Assessment   Patient has had a recent decline in their functional status and demonstrates the ability to make significant improvements in function in a reasonable and predictable amount of time.     Equipment Recommendations   Other (comment)  (defer to next venue)     Recommendations for Other Services   PT consult;Rehab consult     Precautions/Restrictions   Precautions Precautions: Fall;Other (comment) (ileostomy, nephrology tube, drain) Precaution/Restrictions Comments: BPs soft, and with a drop in standing; Multiple tubes an dlines; L nephrostomy, RUQ ileostomy, urostomy Restrictions Weight Bearing Restrictions Per Provider Order: No     Mobility Bed Mobility Overal bed mobility: Needs Assistance             General bed mobility comments: Bed Egress used today    Transfers Overall transfer level: Needs assistance Equipment used: Rolling walker (2 wheels) Transfers: Sit to/from Stand Sit to Stand: Min assist, +2 safety/equipment           General transfer comment: minAx2 for power up from bed in chair position to standing with RW      Balance Overall balance assessment: Mild deficits observed, not formally tested                                         ADL either performed or assessed with clinical judgement   ADL Overall ADL's : Needs assistance/impaired Eating/Feeding: Minimal assistance Eating/Feeding Details (indicate cue type and reason): fatigues quickly Grooming: Wash/dry face;Set up;Sitting Grooming Details (indicate cue type and reason): chair position in bed Upper Body Bathing: Moderate assistance   Lower Body Bathing: Maximal assistance   Upper Body Dressing : Moderate assistance   Lower Body Dressing: Maximal assistance;Sit to/from stand   Toilet  Transfer: Minimal assistance;+2 for safety/equipment;Ambulation;Rolling walker (2 wheels) (limited)           Functional mobility during ADLs: Minimal assistance;+2 for safety/equipment;Rolling walker (2 wheels) (sit<>stand only today) General ADL Comments: decreased activity tolerance, decreased access to LB for ADL     Vision Ability to See in Adequate Light: 0 Adequate Vision Assessment?: No apparent  visual deficits     Perception         Praxis         Pertinent Vitals/Pain Pain Assessment Pain Assessment: Faces Faces Pain Scale: Hurts little more Pain Location: generalized with movement Pain Descriptors / Indicators: Grimacing, Guarding Pain Intervention(s): Monitored during session, Repositioned     Extremity/Trunk Assessment Upper Extremity Assessment Upper Extremity Assessment: Generalized weakness   Lower Extremity Assessment Lower Extremity Assessment: Defer to PT evaluation   Cervical / Trunk Assessment Cervical / Trunk Assessment: Other exceptions Cervical / Trunk Exceptions: multiple drains   Communication Communication Communication: Impaired Factors Affecting Communication: Hearing impaired   Cognition Arousal: Alert Behavior During Therapy: WFL for tasks assessed/performed, Flat affect Cognition: No apparent impairments             OT - Cognition Comments: flat affect, but answers questions appropriately                 Following commands: Intact       Cueing  General Comments   Cueing Techniques: Verbal cues      Exercises     Shoulder Instructions      Home Living Family/patient expects to be discharged to:: Private residence Living Arrangements: Spouse/significant other Available Help at Discharge: Family;Available 24 hours/day Type of Home: House Home Access: Stairs to enter [employer] of Steps: 2 Entrance Stairs-Rails: Can reach both Home Layout: Two level;Able to live on main level with bedroom/bathroom Alternate Level Stairs-Number of Steps: 14-16 stairs Alternate Level Stairs-Rails: Left Bathroom Shower/Tub: Chief Strategy Officer: Standard Bathroom Accessibility: Yes How Accessible: Accessible via walker Home Equipment: Rollator (4 wheels);Rolling Walker (2 wheels)          Prior Functioning/Environment Prior Level of Function : Independent/Modified Independent              Mobility Comments: mod i RW, got weak after a couple of days at home then needed help ADLs Comments: pt was bathing himself, sponge bathing, had assist wtih dressing.    OT Problem List: Decreased strength;Decreased range of motion;Decreased activity tolerance;Impaired balance (sitting and/or standing);Decreased knowledge of use of DME or AE;Increased edema   OT Treatment/Interventions: Self-care/ADL training;Energy conservation;DME and/or AE instruction;Therapeutic activities;Patient/family education;Balance training      OT Goals(Current goals can be found in the care plan section)   Acute Rehab OT Goals Patient Stated Goal: get back to independent as possible OT Goal Formulation: With patient/family Time For Goal Achievement: 10/09/24 Potential to Achieve Goals: Good ADL Goals Pt Will Perform Grooming: with supervision;standing Pt Will Perform Upper Body Dressing: with supervision;sitting Pt Will Perform Lower Body Dressing: with supervision;sit to/from stand;with adaptive equipment Additional ADL Goal #1: Pt will perform bed mobility prior to engaging in ADL at supervision level Additional ADL Goal #2: Pt will perform functional transfers for ADL at supervision level using LRAD   OT Frequency:  Min 2X/week    Co-evaluation PT/OT/SLP Co-Evaluation/Treatment: Yes Reason for Co-Treatment: For patient/therapist safety;To address functional/ADL transfers PT goals addressed during session: Mobility/safety with mobility;Balance;Strengthening/ROM;Proper use of DME OT goals addressed during session: ADL's and self-care;Proper use of  Adaptive equipment and DME;Strengthening/ROM      AM-PAC OT 6 Clicks Daily Activity     Outcome Measure Help from another person eating meals?: A Little Help from another person taking care of personal grooming?: A Little Help from another person toileting, which includes using toliet, bedpan, or urinal?: A Little Help from another person bathing  (including washing, rinsing, drying)?: A Lot Help from another person to put on and taking off regular upper body clothing?: A Lot Help from another person to put on and taking off regular lower body clothing?: A Lot 6 Click Score: 15   End of Session Equipment Utilized During Treatment: Rolling walker (2 wheels) Nurse Communication: Mobility status;Precautions  Activity Tolerance: Patient tolerated treatment well Patient left: in bed;with call bell/phone within reach;with family/visitor present (bed in chair position)  OT Visit Diagnosis: Unsteadiness on feet (R26.81);Other abnormalities of gait and mobility (R26.89);Muscle weakness (generalized) (M62.81);Pain Pain - Right/Left: Right Pain - part of body:  (abdomen)                Time: 8961-8884 OT Time Calculation (min): 37 min Charges:  OT General Charges $OT Visit: 1 Visit OT Evaluation $OT Eval Moderate Complexity: 1 Mod  [name] DEL OTR/L Acute Rehabilitation Services Office: [phone]   [name] JINNY Odea 09/25/2024, 1:51 PM

## 2024-09-25 NOTE — Progress Notes (Signed)
   Inpatient Rehab Admissions Coordinator :  Per therapy recommendations patient was screened for CIR candidacy by Ottie Glazier RN MSN. Patient is not yet at a level to tolerate the intensity required to pursue a CIR admit . The CIR admissions team will follow and monitor for progress and place a Rehab Consult order if felt to be appropriate. Please contact me with any questions.  Ottie Glazier RN MSN Admissions Coordinator [phone]

## 2024-09-25 NOTE — Progress Notes (Signed)
 eLink Physician-Brief Progress Note Patient Name: ABDULKAREEM BADOLATO DOB: Jan 21, 1951 MRN: [id number]   Date of Service  09/25/2024  HPI/Events of Note  Phosphorus 2.3, K+ 4.8  eICU Interventions  Sodium phosphate  15 mmol iv x 1 ordered.        Kristopher Thompson U [place] 09/25/2024, 3:57 AM

## 2024-09-25 NOTE — Consult Note (Addendum)
 WOC Nurse Consult Note: Reason for Consult: requested NPWT after surgical procedure. Wound type: Surgical (right flank abd wall, drainage abscess) Pressure Injury POA: NA Addendum:  Patient is well known for WOC team regarding ostomy and wound care. Last visit 11/11 for abd midline wound dressing recommendation and ostomy consult.  Secure chat with bed nurse. Waiting the supplies will be sent to his room to apply [facility] dressing today.  WOC team will follow. Please reconsult if further assistance is needed. Thank-you,  Lela Holm MSN, RN, CNS.  (Phone [phone])

## 2024-09-25 NOTE — TOC Progression Note (Signed)
 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 Patient underwent I&D yesterday 09/24/24 by General Sx. Patient is with RUQ Ileostomy, Lt Nephrostomy and Urostomy. On Heparin  gtt for PE, IV abx, Levo.   Patient not Medically ready for discharge.  CM will continue to follow as patient progresses with care towards discharge.                       Expected Discharge Plan and Services                                               Social Drivers of Health (SDOH) Interventions SDOH Screenings   Food Insecurity: No Food Insecurity (09/22/2024)  Housing: Low Risk  (09/22/2024)  Transportation Needs: No Transportation Needs (09/22/2024)  Utilities: At Risk (09/22/2024)  Depression (PHQ2-9): Medium Risk (09/16/2024)  Social Connections: Socially Integrated (09/22/2024)  Tobacco Use: Medium Risk (09/24/2024)    Readmission Risk Interventions    09/22/2024   12:33 PM 05/26/2024    8:59 AM 04/12/2024    4:58 PM  Readmission Risk Prevention Plan  Transportation Screening Complete Complete Complete  PCP or Specialist Appt within 5-7 Days  Complete   PCP or Specialist Appt within 3-5 Days   Complete  Home Care Screening  Complete   Medication Review (RN CM)  Complete   HRI or Home Care Consult   Complete  Social Work Consult for Recovery Care Planning/Counseling   Complete  Palliative Care Screening   Not Applicable  Medication Review [facility]) Referral to Pharmacy  Complete  PCP or Specialist appointment within 3-5 days of discharge Complete    HRI or Home Care  Consult Complete    SW Recovery Care/Counseling Consult Complete    Palliative Care Screening Not Applicable    Skilled Nursing Facility Not Applicable

## 2024-09-26 DIAGNOSIS — Z936 Other artificial openings of urinary tract status: Secondary | ICD-10-CM

## 2024-09-26 LAB — BASIC METABOLIC PANEL WITH GFR
Anion gap: 9 (ref 5–15)
BUN: 15 mg/dL (ref 8–23)
CO2: 13 mmol/L — ABNORMAL LOW (ref 22–32)
Calcium: 8.5 mg/dL — ABNORMAL LOW (ref 8.9–10.3)
Chloride: 115 mmol/L — ABNORMAL HIGH (ref 98–111)
Creatinine, Ser: 1.37 mg/dL — ABNORMAL HIGH (ref 0.61–1.24)
GFR, Estimated: 55 mL/min — ABNORMAL LOW (ref >60)
Glucose, Bld: 106 mg/dL — ABNORMAL HIGH (ref 70–99)
Potassium: 4 mmol/L (ref 3.5–5.1)
Sodium: 137 mmol/L (ref 135–145)

## 2024-09-26 LAB — CULTURE, BLOOD (ROUTINE X 2)
Culture: NO GROWTH
Culture: NO GROWTH
Special Requests: ADEQUATE
Special Requests: ADEQUATE

## 2024-09-26 LAB — MULTIPLE MYELOMA PANEL, SERUM
Albumin SerPl Elph-Mcnc: 2.5 g/dL — ABNORMAL LOW (ref 2.9–4.4)
Albumin/Glob SerPl: 1.1 (ref 0.7–1.7)
Alpha 1: 0.2 g/dL (ref 0.0–0.4)
Alpha2 Glob SerPl Elph-Mcnc: 0.8 g/dL (ref 0.4–1.0)
B-Globulin SerPl Elph-Mcnc: 0.7 g/dL (ref 0.7–1.3)
Gamma Glob SerPl Elph-Mcnc: 0.7 g/dL (ref 0.4–1.8)
Globulin, Total: 2.4 g/dL (ref 2.2–3.9)
IgA: 206 mg/dL (ref 61–437)
IgG (Immunoglobin G), Serum: 828 mg/dL (ref 603–1613)
IgM (Immunoglobulin M), Srm: 87 mg/dL (ref 15–143)
Total Protein ELP: 4.9 g/dL — ABNORMAL LOW (ref 6.0–8.5)

## 2024-09-26 LAB — CBC
HCT: 27.9 % — ABNORMAL LOW (ref 39.0–52.0)
Hemoglobin: 8.5 g/dL — ABNORMAL LOW (ref 13.0–17.0)
MCH: 25.6 pg — ABNORMAL LOW (ref 26.0–34.0)
MCHC: 30.5 g/dL (ref 30.0–36.0)
MCV: 84 fL (ref 80.0–100.0)
Platelets: 206 K/uL (ref 150–400)
RBC: 3.32 MIL/uL — ABNORMAL LOW (ref 4.22–5.81)
RDW: 17.9 % — ABNORMAL HIGH (ref 11.5–15.5)
WBC: 8.3 K/uL (ref 4.0–10.5)
nRBC: 0 % (ref 0.0–0.2)

## 2024-09-26 LAB — HEPARIN LEVEL (UNFRACTIONATED): Heparin Unfractionated: 0.36 [IU]/mL (ref 0.30–0.70)

## 2024-09-26 LAB — MAGNESIUM: Magnesium: 1.6 mg/dL — ABNORMAL LOW (ref 1.7–2.4)

## 2024-09-26 LAB — PHOSPHORUS: Phosphorus: 2.4 mg/dL — ABNORMAL LOW (ref 2.5–4.6)

## 2024-09-26 MED ORDER — DIPHENOXYLATE-ATROPINE 2.5-0.025 MG PO TABS
2.0000 | ORAL_TABLET | Freq: Two times a day (BID) | ORAL | Status: DC
  Administered 2024-09-26 – 2024-09-27 (×3): 2 via ORAL
  Filled 2024-09-26 (×3): qty 2

## 2024-09-26 MED ORDER — DIPHENOXYLATE-ATROPINE 2.5-0.025 MG PO TABS
1.0000 | ORAL_TABLET | Freq: Once | ORAL | Status: AC
  Administered 2024-09-26: 1 via ORAL
  Filled 2024-09-26: qty 1

## 2024-09-26 MED ORDER — ALBUMIN HUMAN 25 % IV SOLN
25.0000 g | Freq: Once | INTRAVENOUS | Status: AC
  Administered 2024-09-26: 25 g via INTRAVENOUS
  Filled 2024-09-26: qty 100

## 2024-09-26 MED ORDER — LACTATED RINGERS IV BOLUS
500.0000 mL | Freq: Once | INTRAVENOUS | Status: AC
  Administered 2024-09-26: 500 mL via INTRAVENOUS

## 2024-09-26 MED ORDER — MAGNESIUM SULFATE 2 GM/50ML IV SOLN
2.0000 g | Freq: Once | INTRAVENOUS | Status: AC
  Administered 2024-09-26: 2 g via INTRAVENOUS
  Filled 2024-09-26: qty 50

## 2024-09-26 NOTE — Assessment & Plan Note (Deleted)
-   Urology consulted, appreciate recommendations  - Continue ongoing volume resuscitation with IV fluids - Hematuria will probably continue since patient is on heparin  gtt - Trend hemoglobin; bleeding from urinary source very mild and most of hemoglobin drop is attributable to hemodilution - General Surgery consult to follow-up for increase in abdominal gas foci - General Surgery recommendations  - Operative abdominal wall I&D and exploration for 4pm Thursday 11/13  - Begin loperamide  and FiberCon to help calm diarrhea and bulk stool - Antibiotics - received Rocephin  IV x1 in ED --> transition to Zosyn  dosed per pharmacy - Blood cultures NGTD - Urine culture + for E. coli - Continue pain control with pain control: Tylenol  1000 mg Q6H PRN, Oxycodone  5-10 mg Q6H PRN - Nausea control with PRN IV compazine   - AM Labs: CBC, BMP - Fall precautions - Daily weight - Delirium precautions - PT/OT consult, appreciate recommendations

## 2024-09-26 NOTE — Assessment & Plan Note (Deleted)
 PE w/ Chronic anticoagulation : Hold Eliquis  5 mg tab BID.  Continue heparin  gtt.  Anxiety : Continue ATARAX  25 mg TID PRN, Zoloft  50 mg Daily  Insomnia: Continue Melatonin 3 mg at bedtime HLD : Continue CRESTOR  10 mg at bedtime Hypotensive : Continue Midodrine  10 mg TID w/ meal  High output ileostomy (HCC) Imodium  4 g TID GERD : Starting IV PPI DM II : Monitor Daily BG w/ AM BMP  CKD stage 3b baseline GFR 30-44   OSA  CHF Hearing loss

## 2024-09-26 NOTE — Progress Notes (Addendum)
 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, prostate CA s/p prostatectomy, recent admission 7/25 for incarcerated incisional, parasternal parastomal and inguinal hernia with subsequent ileostomy, urostomy with ileal conduit revision, presents with nausea vomiting and abdominal pain and is being managed 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 Pending abdominal wall flank I&D and washout for 11/13 Critical care team is brought on board for persistent hypotension refractory to fluids  Pertinent  Medical History  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  Interim History / Subjective:  Patient was weaned off Levophed  yesterday.  His ileostomy output still continues to be high.  He is not eating much.  This morning he was on 2 of levo.  He is alert and oriented.  No respiratory distress on room air.  Objective    Blood pressure 97/63, pulse (!) 55, temperature 97.6 F (36.4 C), temperature source Oral, resp. rate 18, height 5' 11 (1.803 m), weight 83.2 kg, SpO2 97%.        Intake/Output Summary (Last 24 hours) at  09/26/2024 1147 Last data filed at 09/26/2024 1000 Gross per 24 hour  Intake 2172.63 ml  Output 3915 ml  Net -1742.37 ml   Filed Weights   09/21/24 1155 09/24/24 1554 09/26/24 0500  Weight: 74.8 kg 72.6 kg 83.2 kg    Examination: General: Alert and oriented.  He is tired and sleeping but wakes up. HEENT: Normocephalic, PERRLA intact, mucous membranes moist CV: S1-S2 heard no S3 no murmur no JVD pulm: clear, diminished, no distress  Abs: bs active, soft, ileostomy- high output, urostomy- producing urine, midline dressing clean dry intact  Extremities: Bilateral upper and lower extremity edema.  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 Heparin  gtt restarted after I&D for abdominal wall abscess Follow up with cultures sent- gram negative rods seen, wait for Culture ID to result, continue Zosyn    Continue to follow BCx2  Continue immodium and FiberCon Give an additional 500 cc LR Bolus  Start on D5NS, 20 meqK  Continue to trend electrolytes, continue to replace as needed  Use of MAP goal of 60.   Increase lamotil to 2mg  bid from 1mg  bid due to increased illeostomy output.  History of pulmonary embolism on Eliquis  at home  -Request held perioperatively on heparin  gtt.  Hypercalcemia- resolved  Hypophosphatemia Received Calcitonin on  11/1, which did decrease calcium  levels PTH < 6, Vitamin D  Levels in normal limits  Suspect related to AKI but Patient has had history of bladder/prostate cancer  P:  Continue to  trend Calcium  and phosphorus levels  PSA is normal.   Left forearm Phlebitis  Appears to be improving This is not where levophed  was infusing according to RNs, IV removed  P: Continue to monitor Elevate, warm compress if needed   AKI Hyponatremia 2.37> 1.49>1.50  Improving P: Continue to trend renal function daily  Continue to monitor and optimize electrolytes daily Continue to monitor urine output Continue strict I/Os Continue Adequate renal perfusion  Avoid nephrotoxic agents  Continue dextrose  normal saline for maintenance at 75 cc an hour because of high output from ileostomy.  Discussed with family medicine team.  If the patient remains off Levophed  overnight he could be transferred out tomorrow.  Labs   CBC: Recent Labs  Lab 09/21/24 1232 09/21/24 1526 09/23/24 0328 09/23/24 2022 09/24/24 0220 09/25/24 0224 09/26/24 0213  WBC 12.7*   < > 7.0 6.3 6.9 10.1 8.3  NEUTROABS 10.1*  --   --   --   --   --   --   HGB 13.1   < > 8.9* 8.5* 8.0* 8.8* 8.5*  HCT 41.5   < > 27.0* 26.6* 25.1* 27.4* 27.9*  MCV 79.5*   < > 78.5* 79.4* 79.7* 79.4* 84.0  PLT 460*   < > 217 225 214 252 206   < > = values in this interval not displayed.    Basic Metabolic Panel: Recent Labs  Lab 09/23/24 0328 09/23/24 2022 09/24/24 0220 09/24/24 1936 09/25/24 0224 09/26/24 0857  NA 138 141 139 136 133* 137  K 3.7 3.5 3.1* 4.1 4.8 4.0  CL 111 116* 115* 112* 113* 115*  CO2 17* 16* 15* 16* 14* 13*  GLUCOSE 89 103* 93 96 178* 106*  BUN 37* 30* 28* 21 19 15   CREATININE 1.70* 1.47* 1.49* 1.50* 1.50* 1.37*  CALCIUM  9.2 8.7* 8.7* 8.8* 8.8* 8.5*  MG 1.7 1.5* 1.5* 2.1 2.0 1.6*  PHOS 2.3*  --  2.7 2.4* 2.3* 2.4*   GFR: Estimated Creatinine Clearance: 51.9 mL/min (A) (by C-G formula based on SCr of 1.37 mg/dL (H)). Recent Labs  Lab 09/21/24 1241 09/21/24 1453 09/22/24 0442 09/23/24 2022 09/24/24 0220 09/25/24 0224 09/26/24 0213  WBC  --   --    < > 6.3 6.9 10.1 8.3  LATICACIDVEN 1.6  1.9  --  1.3 0.6  --   --    < > = values in this interval not displayed.    Liver Function Tests: Recent Labs  Lab 09/21/24 1232 09/22/24 0442  AST 26 19  ALT 25 18  ALKPHOS 119 79  BILITOT 0.5 0.6  PROT 8.8* 6.6  ALBUMIN  4.2 2.9*   Recent Labs  Lab 09/21/24 1232  LIPASE 22   No results for input(s): AMMONIA in the last 168 hours.  ABG    Component Value Date/Time   PHART 7.28 (L) 04/21/2024 1715   PCO2ART 47 04/21/2024 1715   PO2ART 75 (L) 04/21/2024 1715   HCO3 14.2 (L) 05/26/2024 0423   TCO2 21 (L) 09/21/2024 1526   ACIDBASEDEF 10.1 (H) 05/26/2024 0423   O2SAT 90.5 05/26/2024 0423     Coagulation Profile: Recent Labs  Lab 09/21/24 1232  INR 1.3*    Cardiac Enzymes: No results for input(s): CKTOTAL, CKMB, CKMBINDEX, TROPONINI in the last 168 hours.  HbA1C: Hgb A1c MFr Bld  Date/Time Value Ref Range Status  04/01/2024 10:50 AM 5.3 4.8 - 5.6 % Final    Comment:    (NOTE) Pre diabetes:          5.7%-6.4%  Diabetes:              >6.4%  Glycemic control for   <7.0% adults with diabetes     CBG: Recent Labs  Lab 09/23/24 2116 09/24/24 1758  GLUCAP 98 74    CC: 35 mins   Tamela Stakes, MD  Attending Physician, Critical Care Medicine [facility] See Amion for pager If no response to pager, please call [phone] until 7pm After 7pm, Please call E-link [phone]

## 2024-09-26 NOTE — Plan of Care (Signed)

## 2024-09-26 NOTE — Progress Notes (Signed)
 ANTICOAGULATION CONSULT NOTE  Pharmacy Consult for Heparin  Indication:  History of PE  Allergies  Allergen Reactions   Demerol  [Meperidine ] Nausea And Vomiting    Patient Measurements: Height: 5' 11 (180.3 cm) Weight: 83.2 kg (183 lb 6.8 oz) IBW/kg (Calculated) : 75.3 Heparin  Dosing Weight: 74.8 kg  Vital Signs: Temp: 98.3 F (36.8 C) (11/15 0744) Temp Source: Oral (11/15 0744) BP: 102/62 (11/15 0700) Pulse Rate: 57 (11/15 0700)  Labs: Recent Labs    09/24/24 0220 09/24/24 1936 09/25/24 0224 09/25/24 1321 09/26/24 0213  HGB 8.0*  --  8.8*  --  8.5*  HCT 25.1*  --  27.4*  --  27.9*  PLT 214  --  252  --  206  APTT 107*  --   --  57*  --   HEPARINUNFRC 0.56  --   --  0.32 0.36  CREATININE 1.49* 1.50* 1.50*  --   --     Estimated Creatinine Clearance: 47.4 mL/min (A) (by C-G formula based on SCr of 1.5 mg/dL (H)).  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 Heparin  per pharmacy consult placed for hx of PE on apixaban  unable to take PO. Last dose 9am on 11/10. Will require aPTT monitoring due to likely falsely high anti-Xa level secondary to DOAC use.   Heparin  stopped for I&D on 11/13. Per Dr. Sheldon, restart 12h post op with plans to resume 11/14 0600.  11/15 HL therapeutic at 0.36.   Goal of Therapy:  Heparin  level 0.3-0.7 units/ml Monitor platelets by anticoagulation protocol: Yes   Plan:  Continue heparin  infusion at 1100 units/hr Daily heparin  level and CBC F/u bleeding and ability to transition back to oral anticoagulation  Thank you for allowing pharmacy to participate in this patient's care.  Rankin Sams, PharmD, BCPS, BCCCP Clinical Pharmacist

## 2024-09-26 NOTE — Assessment & Plan Note (Deleted)
 Corrected serum calcium  12.5 today, will begin workup for etiology of hypercalcemia with the following labs: - PTH  - PTH RP - TSH Ab, T3, thyroid  US 

## 2024-09-26 NOTE — Assessment & Plan Note (Deleted)
 Midline abdominal wound s/p bowel perforation with open abdomen.  Well-appearing without erythema or drainage with clean bandage on top. - Wound care consult consulted, appreciate recs - Bandage change and wound care has signed off at this time.

## 2024-09-26 NOTE — Plan of Care (Addendum)
 FMTS Interim Progress Note  S: Patient was seen and evaluated at bedside. He was sitting up comfortably eating cheerios. No complaints except for abd tenderness at site of abdominal washout where clean and dry wound vac is placed.  O: BP 97/63   Pulse (!) 55   Temp 97.6 F (36.4 C) (Oral)   Resp 18   Ht 5' 11 (1.803 m)   Wt 83.2 kg   SpO2 97%   BMI 25.58 kg/m   General: A&O, NAD HEENT: EOM grossly intact, neck is supple with FROM Cardiac: RRR, no m/r/g Respiratory: CTAB, normal WOB, no w/c/r GI: Soft, moderate diffuse TTP, non-distended, clean bandage over midline wound  Extremities: mild diffuse extremity TTP and mild pitting edema Neuro: Normal gait, moves all four extremities appropriately. Psych: Appropriate mood and affect   A/P: Plan to stepdown back to [facility] service from ICU this AM however patient continues to need Levophed  for MAP >65. Discussed with PCCM team who will reevaluate this evening and if he is stable off of Levophed  overnight may be transferred back to us  at 0700 tomorrow morning 11/16.  Lupie Credit, DO 09/26/2024, 12:41 PM PGY-1, [facility] Family Medicine Service pager [phone]

## 2024-09-26 NOTE — Assessment & Plan Note (Deleted)
 Chronic, on home midodrine  10mg  TID. Low inpatient blood pressures despite home midodrine  and IVF resuscitation.   - Increase midodrine  from 10mg  TID to 15mg  TID - Continue IVF resuscitation with additional bolus PRN

## 2024-09-26 NOTE — Assessment & Plan Note (Deleted)
 Secondary to chronic illnesses. - RD consult, appreciate recs  - Daily multivitamin  - Daily Juven BID to support wound healing   - Magic cup TID with meals  - Regular diet as tolerated

## 2024-09-27 DIAGNOSIS — R571 Hypovolemic shock: Secondary | ICD-10-CM

## 2024-09-27 LAB — BASIC METABOLIC PANEL WITH GFR
Anion gap: 10 (ref 5–15)
BUN: 17 mg/dL (ref 8–23)
CO2: 15 mmol/L — ABNORMAL LOW (ref 22–32)
Calcium: 8.7 mg/dL — ABNORMAL LOW (ref 8.9–10.3)
Chloride: 115 mmol/L — ABNORMAL HIGH (ref 98–111)
Creatinine, Ser: 1.46 mg/dL — ABNORMAL HIGH (ref 0.61–1.24)
GFR, Estimated: 51 mL/min — ABNORMAL LOW (ref >60)
Glucose, Bld: 103 mg/dL — ABNORMAL HIGH (ref 70–99)
Potassium: 4 mmol/L (ref 3.5–5.1)
Sodium: 140 mmol/L (ref 135–145)

## 2024-09-27 LAB — CBC
HCT: 24.9 % — ABNORMAL LOW (ref 39.0–52.0)
Hemoglobin: 7.9 g/dL — ABNORMAL LOW (ref 13.0–17.0)
MCH: 25.6 pg — ABNORMAL LOW (ref 26.0–34.0)
MCHC: 31.7 g/dL (ref 30.0–36.0)
MCV: 80.8 fL (ref 80.0–100.0)
Platelets: 229 K/uL (ref 150–400)
RBC: 3.08 MIL/uL — ABNORMAL LOW (ref 4.22–5.81)
RDW: 18.5 % — ABNORMAL HIGH (ref 11.5–15.5)
WBC: 7.3 K/uL (ref 4.0–10.5)
nRBC: 0 % (ref 0.0–0.2)

## 2024-09-27 LAB — HEPARIN LEVEL (UNFRACTIONATED)
Heparin Unfractionated: 0.26 [IU]/mL — ABNORMAL LOW (ref 0.30–0.70)
Heparin Unfractionated: 0.37 [IU]/mL (ref 0.30–0.70)

## 2024-09-27 LAB — MAGNESIUM: Magnesium: 2 mg/dL (ref 1.7–2.4)

## 2024-09-27 LAB — PHOSPHORUS: Phosphorus: 2.5 mg/dL (ref 2.5–4.6)

## 2024-09-27 LAB — LACTIC ACID, PLASMA: Lactic Acid, Venous: 1.3 mmol/L (ref 0.5–1.9)

## 2024-09-27 MED ORDER — LOPERAMIDE HCL 2 MG PO CAPS
4.0000 mg | ORAL_CAPSULE | ORAL | Status: DC
  Administered 2024-09-27 – 2024-09-28 (×6): 4 mg via ORAL
  Filled 2024-09-27 (×6): qty 2

## 2024-09-27 MED ORDER — SODIUM CHLORIDE 0.9 % IV SOLN
2.0000 g | INTRAVENOUS | Status: DC
  Administered 2024-09-27: 2 g via INTRAVENOUS
  Filled 2024-09-27: qty 20

## 2024-09-27 NOTE — Progress Notes (Signed)
 ANTICOAGULATION CONSULT NOTE  Pharmacy Consult for Heparin  Indication:  History of PE  Allergies  Allergen Reactions   Demerol  [Meperidine ] Nausea And Vomiting    Patient Measurements: Height: 5' 11 (180.3 cm) Weight: 83.2 kg (183 lb 6.8 oz) IBW/kg (Calculated) : 75.3 Heparin  Dosing Weight: 74.8 kg  Vital Signs: Temp: 97.7 F (36.5 C) (11/16 1536) Temp Source: Oral (11/16 1536) BP: 112/64 (11/16 1700) Pulse Rate: 57 (11/16 1700)  Labs: Recent Labs    09/25/24 0224 09/25/24 1321 09/25/24 1321 09/26/24 0213 09/26/24 0857 09/27/24 0219 09/27/24 1621  HGB 8.8*  --   --  8.5*  --  7.9*  --   HCT 27.4*  --   --  27.9*  --  24.9*  --   PLT 252  --   --  206  --  229  --   APTT  --  57*  --   --   --   --   --   HEPARINUNFRC  --  0.32   < > 0.36  --  0.26* 0.37  CREATININE 1.50*  --   --   --  1.37* 1.46*  --    < > = values in this interval not displayed.    Estimated Creatinine Clearance: 48.7 mL/min (A) (by C-G formula based on SCr of 1.46 mg/dL (H)).  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 Heparin  per pharmacy consult placed for hx of PE on apixaban  unable to take PO. Last dose 9am on 11/10. Will require aPTT monitoring due to likely falsely high anti-Xa level secondary to DOAC use.   Heparin  level therapeutic s/p rate increase to 1200 units/hr  Goal of Therapy:  Heparin  level 0.3-0.7 units/ml Monitor platelets by anticoagulation protocol: Yes   Plan:  Continue heparin  gtt at 1200 units/hr Daily heparin  level, CBC, s/s bleeding  Dorn Poot, PharmD, [facility] Clinical Pharmacist ED Pharmacist Phone # [phone] 09/27/2024 5:10 PM

## 2024-09-27 NOTE — Progress Notes (Signed)
 NAME:  Kristopher Thompson, MRN:  [id number], DOB:  1951/03/08, LOS: 6 ADMISSION DATE:  09/21/2024, CONSULTATION DATE: 09/23/2024 REFERRING MD: Family medicine team, CHIEF COMPLAINT: Recurrent abdominal abscess  History of Present Illness:  [name] is a 73 year old male with a history complex medical history including bladder CA s/p cystectomy with ileal conduit-2018, prostate CA s/p prostatectomy, recent admission 7/25 for incarcerated incisional, parasternal parastomal and inguinal hernia with subsequent ileostomy, urostomy with ileal conduit revision, presents with nausea vomiting and abdominal pain and is being managed 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 Pending abdominal wall flank I&D and washout for 11/13 Critical care team is brought on board for persistent hypotension refractory to fluids  Pertinent  Medical History  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 11/16 surgical wound cultures are growing E. coli so antibiotics are de-escalated to ceftriaxone  as the sensitivities are still pending.  Patient continues to need on or off Levophed  due to high ileostomy output.  Interim History / Subjective:  Patient is alert awake and doing well.  Again he was weaned off Levophed  overnight but restarted in the morning at 2 mcg/min.  He has persistently high ileostomy output.  Objective    Blood pressure 112/64, pulse (!) 57,  temperature 97.7 F (36.5 C), temperature source Oral, resp. rate 16, height 5' 11 (1.803 m), weight 83.2 kg, SpO2 100%.        Intake/Output Summary (Last 24 hours) at 09/27/2024 1710 Last data filed at 09/27/2024 1700 Gross per 24 hour  Intake 3791.12 ml  Output 2400 ml  Net 1391.12 ml   Filed Weights   09/21/24 1155 09/24/24 1554 09/26/24 0500  Weight: 74.8 kg 72.6 kg 83.2 kg    Examination: General: Alert and oriented.  No acute distress. HEENT: Normocephalic, PERRLA intact, mucous membranes moist CV: S1-S2 heard no S3 no murmur no JVD pulm: clear, diminished, no distress  Abs: bs active, soft, ileostomy- high output, urostomy- producing urine, midline dressing clean dry intact  Extremities: Bilateral upper and lower extremity edema.  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 Heparin  gtt restarted after I&D for abdominal wall abscess Wound cultures show E. coli  Zosyn  changed to Rocephin  today. Continue to follow BCx2  Continue immodium and FiberCon Start on D5NS, 20 meqK  Continue to trend electrolytes, continue to replace as needed  Use of MAP goal of 60.   Imodium  is increased to 4 mg every 4 hours given the increased ileostomy output. Increased IV fluid rate to 100 cc an hour To wean down Levophed .  History of pulmonary embolism on Eliquis  at  home  -Request held perioperatively on heparin  gtt.  Hypercalcemia- resolved  Hypophosphatemia Received Calcitonin on 11/1, which did  decrease calcium  levels PTH < 6, Vitamin D  Levels in normal limits  Suspect related to AKI but Patient has had history of bladder/prostate cancer  P:  Continue to trend Calcium  and phosphorus levels  PSA is normal.   Left forearm Phlebitis  Appears to be improving This is not where levophed  was infusing according to RNs, IV removed  P: Continue to monitor Elevate, warm compress if needed   AKI Hyponatremia 2.37> 1.49>1.50 >1.46 Improving P: Continue to trend renal function daily  Continue to monitor and optimize electrolytes daily Continue to monitor urine output Continue strict I/Os Continue Adequate renal perfusion  Avoid nephrotoxic agents  Crease dextrose  normal saline maintenance to 100 cc an hour because of high output from ileostomy.  Wife is in the room spoke with her and updated. Discussed with family medicine team.  If the patient remains off Levophed  overnight he could be transferred out tomorrow.  Labs   CBC: Recent Labs  Lab 09/21/24 1232 09/21/24 1526 09/23/24 2022 09/24/24 0220 09/25/24 0224 09/26/24 0213 09/27/24 0219  WBC 12.7*   < > 6.3 6.9 10.1 8.3 7.3  NEUTROABS 10.1*  --   --   --   --   --   --   HGB 13.1   < > 8.5* 8.0* 8.8* 8.5* 7.9*  HCT 41.5   < > 26.6* 25.1* 27.4* 27.9* 24.9*  MCV 79.5*   < > 79.4* 79.7* 79.4* 84.0 80.8  PLT 460*   < > 225 214 252 206 229   < > = values in this interval not displayed.    Basic Metabolic Panel: Recent Labs  Lab 09/24/24 0220 09/24/24 1936 09/25/24 0224 09/26/24 0857 09/27/24 0219  NA 139 136 133* 137 140  K 3.1* 4.1 4.8 4.0 4.0  CL 115* 112* 113* 115* 115*  CO2 15* 16* 14* 13* 15*  GLUCOSE 93 96 178* 106* 103*  BUN 28* 21 19 15 17   CREATININE 1.49* 1.50* 1.50* 1.37* 1.46*  CALCIUM  8.7* 8.8* 8.8* 8.5* 8.7*  MG 1.5* 2.1 2.0 1.6* 2.0  PHOS 2.7 2.4* 2.3* 2.4* 2.5   GFR: Estimated Creatinine Clearance: 48.7 mL/min (A) (by C-G formula based on SCr of 1.46 mg/dL (H)). Recent Labs  Lab  09/21/24 1453 09/22/24 0442 09/23/24 2022 09/24/24 0220 09/25/24 0224 09/26/24 0213 09/27/24 0219  WBC  --    < > 6.3 6.9 10.1 8.3 7.3  LATICACIDVEN 1.9  --  1.3 0.6  --   --  1.3   < > = values in this interval not displayed.    Liver Function Tests: Recent Labs  Lab 09/21/24 1232 09/22/24 0442  AST 26 19  ALT 25 18  ALKPHOS 119 79  BILITOT 0.5 0.6  PROT 8.8* 6.6  ALBUMIN  4.2 2.9*   Recent Labs  Lab 09/21/24 1232  LIPASE 22   No results for input(s): AMMONIA in the last 168 hours.  ABG    Component Value Date/Time   PHART 7.28 (L) 04/21/2024 1715   PCO2ART 47 04/21/2024 1715   PO2ART 75 (L) 04/21/2024 1715   HCO3 14.2 (L) 05/26/2024 0423   TCO2 21 (L) 09/21/2024 1526   ACIDBASEDEF 10.1 (H) 05/26/2024 0423   O2SAT 90.5 05/26/2024 0423     Coagulation Profile: Recent Labs  Lab 09/21/24 1232  INR 1.3*    Cardiac Enzymes: No results for input(s):  CKTOTAL, CKMB, CKMBINDEX, TROPONINI in the last 168 hours.  HbA1C: Hgb A1c MFr Bld  Date/Time Value Ref Range Status  04/01/2024 10:50 AM 5.3 4.8 - 5.6 % Final    Comment:    (NOTE) Pre diabetes:          5.7%-6.4%  Diabetes:              >6.4%  Glycemic control for   <7.0% adults with diabetes     CBG: Recent Labs  Lab 09/23/24 2116 09/24/24 1758  GLUCAP 98 74    CC: 40 mins   Tamela Stakes, MD  Attending Physician, Critical Care Medicine [facility] See Amion for pager If no response to pager, please call [phone] until 7pm After 7pm, Please call E-link [phone]

## 2024-09-27 NOTE — Progress Notes (Signed)
 ANTICOAGULATION CONSULT NOTE  Pharmacy Consult for Heparin  Indication:  History of PE  Allergies  Allergen Reactions   Demerol  [Meperidine ] Nausea And Vomiting    Patient Measurements: Height: 5' 11 (180.3 cm) Weight: 83.2 kg (183 lb 6.8 oz) IBW/kg (Calculated) : 75.3 Heparin  Dosing Weight: 74.8 kg  Vital Signs: Temp: 98.2 F (36.8 C) (11/15 2347) Temp Source: Oral (11/15 2347) BP: 96/54 (11/16 0700) Pulse Rate: 59 (11/16 0700)  Labs: Recent Labs    09/25/24 0224 09/25/24 1321 09/26/24 0213 09/26/24 0857 09/27/24 0219  HGB 8.8*  --  8.5*  --  7.9*  HCT 27.4*  --  27.9*  --  24.9*  PLT 252  --  206  --  229  APTT  --  57*  --   --   --   HEPARINUNFRC  --  0.32 0.36  --  0.26*  CREATININE 1.50*  --   --  1.37* 1.46*    Estimated Creatinine Clearance: 48.7 mL/min (A) (by C-G formula based on SCr of 1.46 mg/dL (H)).  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 Heparin  per pharmacy consult placed for hx of PE on apixaban  unable to take PO. Last dose 9am on 11/10. Will require aPTT monitoring due to likely falsely high anti-Xa level secondary to DOAC use.   Heparin  stopped for I&D on 11/13. Per Dr. Sheldon, restart 12h post op with plans to resume 11/14 0600.  11/15 HL subtherapeutic at 0.26. No issues with infusion overnight per RN. No bleeding.   Goal of Therapy:  Heparin  level 0.3-0.7 units/ml Monitor platelets by anticoagulation protocol: Yes   Plan:  Increase heparin  infusion to 1200 units/hr Heparin  level in 8 hours  Daily heparin  level and CBC F/u bleeding and ability to transition back to oral anticoagulation  Thank you for allowing pharmacy to participate in this patient's care.  Rankin Sams, PharmD, BCPS, BCCCP Clinical Pharmacist

## 2024-09-27 NOTE — Plan of Care (Signed)
   Problem: Coping: Goal: Level of anxiety will decrease Outcome: Progressing   Problem: Pain Managment: Goal: General experience of comfort will improve and/or be controlled Outcome: Progressing

## 2024-09-28 LAB — BASIC METABOLIC PANEL WITH GFR
Anion gap: 6 (ref 5–15)
BUN: 16 mg/dL (ref 8–23)
CO2: 16 mmol/L — ABNORMAL LOW (ref 22–32)
Calcium: 8.5 mg/dL — ABNORMAL LOW (ref 8.9–10.3)
Chloride: 118 mmol/L — ABNORMAL HIGH (ref 98–111)
Creatinine, Ser: 1.39 mg/dL — ABNORMAL HIGH (ref 0.61–1.24)
GFR, Estimated: 54 mL/min — ABNORMAL LOW (ref >60)
Glucose, Bld: 92 mg/dL (ref 70–99)
Potassium: 3.8 mmol/L (ref 3.5–5.1)
Sodium: 140 mmol/L (ref 135–145)

## 2024-09-28 LAB — CBC
HCT: 25 % — ABNORMAL LOW (ref 39.0–52.0)
Hemoglobin: 8 g/dL — ABNORMAL LOW (ref 13.0–17.0)
MCH: 25.9 pg — ABNORMAL LOW (ref 26.0–34.0)
MCHC: 32 g/dL (ref 30.0–36.0)
MCV: 80.9 fL (ref 80.0–100.0)
Platelets: 210 K/uL (ref 150–400)
RBC: 3.09 MIL/uL — ABNORMAL LOW (ref 4.22–5.81)
RDW: 18.6 % — ABNORMAL HIGH (ref 11.5–15.5)
WBC: 7.4 K/uL (ref 4.0–10.5)
nRBC: 0 % (ref 0.0–0.2)

## 2024-09-28 LAB — HEPARIN LEVEL (UNFRACTIONATED): Heparin Unfractionated: 0.34 [IU]/mL (ref 0.30–0.70)

## 2024-09-28 LAB — CALCIUM, IONIZED: Calcium, Ionized, Serum: 4.9 mg/dL (ref 4.5–5.6)

## 2024-09-28 LAB — MAGNESIUM: Magnesium: 1.7 mg/dL (ref 1.7–2.4)

## 2024-09-28 LAB — SURGICAL PATHOLOGY

## 2024-09-28 LAB — PHOSPHORUS: Phosphorus: 2.5 mg/dL (ref 2.5–4.6)

## 2024-09-28 MED ORDER — OPIUM 10 MG/ML (1%) PO TINC
4.0000 [drp] | Freq: Two times a day (BID) | ORAL | Status: DC
  Filled 2024-09-28: qty 1

## 2024-09-28 MED ORDER — OPIUM 10 MG/ML (1%) PO TINC
4.0000 [drp] | Freq: Two times a day (BID) | ORAL | Status: DC
Start: 2024-09-28 — End: 2024-09-30
  Administered 2024-09-28 – 2024-09-30 (×4): 2 mg via ORAL
  Filled 2024-09-28 (×4): qty 1

## 2024-09-28 MED ORDER — SODIUM CHLORIDE 0.9 % IV BOLUS
1000.0000 mL | Freq: Every day | INTRAVENOUS | Status: DC
  Administered 2024-09-29 – 2024-09-30 (×2): 1000 mL via INTRAVENOUS

## 2024-09-28 MED ORDER — MAGNESIUM SULFATE 2 GM/50ML IV SOLN
2.0000 g | Freq: Once | INTRAVENOUS | Status: AC
  Administered 2024-09-28: 2 g via INTRAVENOUS
  Filled 2024-09-28: qty 50

## 2024-09-28 MED ORDER — OPIUM 10 MG/ML (1%) PO TINC: 4.0000 [drp] | Freq: Two times a day (BID) | ORAL | Status: DC

## 2024-09-28 MED ORDER — APIXABAN 5 MG PO TABS
5.0000 mg | ORAL_TABLET | Freq: Two times a day (BID) | ORAL | Status: DC
  Administered 2024-09-28 – 2024-10-01 (×7): 5 mg via ORAL
  Filled 2024-09-28 (×7): qty 1

## 2024-09-28 MED ORDER — SODIUM CHLORIDE 0.9 % IV SOLN
2.0000 g | INTRAVENOUS | Status: DC
  Administered 2024-09-28 – 2024-09-30 (×3): 2 g via INTRAVENOUS
  Filled 2024-09-28 (×3): qty 20

## 2024-09-28 MED ORDER — HYDROMORPHONE HCL 1 MG/ML IJ SOLN
0.2500 mg | INTRAMUSCULAR | Status: DC | PRN
  Administered 2024-09-28 – 2024-10-01 (×4): 0.25 mg via INTRAVENOUS
  Filled 2024-09-28 (×3): qty 0.5
  Filled 2024-09-28: qty 1

## 2024-09-28 MED ORDER — DIPHENOXYLATE-ATROPINE 2.5-0.025 MG PO TABS
2.0000 | ORAL_TABLET | Freq: Three times a day (TID) | ORAL | Status: DC
  Administered 2024-09-28: 2 via ORAL
  Filled 2024-09-28: qty 2

## 2024-09-28 MED ORDER — LOPERAMIDE HCL 2 MG PO CAPS: 4.0000 mg | ORAL_CAPSULE | Freq: Three times a day (TID) | ORAL | Status: DC

## 2024-09-28 MED ORDER — DIPHENOXYLATE-ATROPINE 2.5-0.025 MG PO TABS
1.0000 | ORAL_TABLET | Freq: Four times a day (QID) | ORAL | Status: DC | PRN
  Administered 2024-09-29 – 2024-09-30 (×2): 2 via ORAL
  Filled 2024-09-28 (×3): qty 2

## 2024-09-28 MED ORDER — SACCHAROMYCES BOULARDII 250 MG PO CAPS
250.0000 mg | ORAL_CAPSULE | Freq: Two times a day (BID) | ORAL | Status: DC
  Administered 2024-09-28 – 2024-10-01 (×7): 250 mg via ORAL
  Filled 2024-09-28 (×7): qty 1

## 2024-09-28 NOTE — Progress Notes (Signed)
 [facility] ADULT ICU REPLACEMENT PROTOCOL   The patient does apply for the [facility] Adult ICU Electrolyte Replacment Protocol based on the criteria listed below:   1.Exclusion criteria: TCTS, ECMO, Dialysis, and Myasthenia Gravis patients 2. Is GFR >/= 30 ml/min? Yes.    Patient's GFR today is 54 3. Is SCr </= 2? Yes.   Patient's SCr is 1.39 mg/dL 4. Did SCr increase >/= 0.5 in 24 hours? No. 5.Pt's weight >40kg  Yes.   6. Abnormal electrolyte(s): Mag 1.7  7. Electrolytes replaced per protocol 8.  Call MD STAT for K+ </= 2.5, Phos </= 1, or Mag </= 1 Physician:  Dr.Paliwal  Kristopher Thompson ORN 09/28/2024 5:07 AM

## 2024-09-28 NOTE — Progress Notes (Signed)
 Inpatient Rehab Admissions Coordinator:   Per therapy recommendation, patient was screened for CIR candidacy by [name] Kleine, MS, CCC-SLP. At this time, Pt. is pain limited, unable to tolerate the intensity of CIR. However,   Pt. may have potential to progress to becoming a potential CIR candidate, so CIR admissions team will follow and monitor for progress and participation with therapies and place consult order if Pt. appears to be an appropriate candidate. Please contact me with any questions.   [name] Kleine, MS, CCC-SLP Rehab Admissions Coordinator  [phone] (celll) [phone] (office)

## 2024-09-28 NOTE — Progress Notes (Signed)
 PT Cancellation Note  Patient Details Name: Kristopher Thompson MRN: [id number] DOB: Mar 26, 1951   Cancelled Treatment:    Reason Eval/Treat Not Completed: (P) Other (comment) (wound vac just changed) Pt refused due to increased pain. PT will follow back later today as able.  [name] B. [name] PT, DPT Acute Rehabilitation Services Please use secure chat or  Call Office [phone]    [name] [name] 09/28/2024, 11:25 AM

## 2024-09-28 NOTE — Consult Note (Signed)
 WOC Nurse wound follow up Wound type: surgical Measurement: 5 cm x 3 cm x 2 cm with 5 cm undermining from 9 o'clock to 3 o'clock  Wound bed: red, moist Drainage (amount, consistency, odor) serosanguinous  Periwound: intact Dressing procedure/placement/frequency: Removed old NPWT dressing Cleansed wound with normal saline  Filled wound with   _1___ piece of black foam  Sealed NPWT dressing at HG Patient received PO pain medication per bedside nurse prior to dressing change Patient tolerated procedure well    WOC nurse will continue to provide NPWT dressing changed due to the complexity of the dressing change.     Thank you, Doyal Polite, MSN, RN, [facility] WOC Team [phone] (Available Mon-Fri 0700-1500)

## 2024-09-28 NOTE — Progress Notes (Signed)
 09/28/2024  Kristopher Thompson [id number] 11-07-1951  CARE TEAM: PCP: [name], MD  Outpatient Care Team: Patient Care Team: [name], MD as PCP - General (Internal Medicine) [name], MD as Consulting Physician (Dermatology) [name], MD as Referring Physician (Pulmonary Disease) [name], MD as Consulting Physician (General Surgery) [name], MD as Consulting Physician (Urology)  Inpatient Treatment Team: Treatment Team:  Neda Jennet LABOR, MD [name], MD [name], MD [name], MD [name], MD Pccm, Md, MD [address], Amos File, RN Hilliard, [name] PARAS, OT Fleeta Rayann [name], PT Hylton, Ronal SAUNDERS, RN Merilee Sharyne FERNS, [facility] [name], RN   Problem List:   Principal Problem:   Hematuria Active Problems:   Hydronephrosis   E-coli UTI   Inability to tolerate diet   Chronic health problem   CKD (chronic kidney disease)   Hypercalcemia   Open wound   Delayed wound healing   Protein-calorie malnutrition, moderate   Protein-calorie malnutrition, severe   Orthostatic hypotension   Abdominal wall abscess   04/10/2024  POST-OPERATIVE DIAGNOSIS:  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   PROCEDURE:   -ROBOTIC LYSIS OF ADHESIONS X 4 HOURS -COMPONENT SEPARATION - TRANSVERSUS ABDOMINIS REALEASE (TAR) BILATERAL -ROBOTIC REPAIRS OF  INCISIONAL, PARASTOMAL, LEFT INGUINAL  INCARCERATED ABDOMINAL WALL HERNIAS WITH MESH -UROSTOMY ILEAL CONDUIT REVISION -INTRAOPERATIVE ASSESSMENT OF TISSUE VASCULAR PERFUSION USING ICG (indocyanine green ) -IMMUNOFLUORESCENCE -TRANSVERSUS ABDOMINIS PLANE (TAP) BLOCK - BILATERAL    SURGEON:  [name], MD   04/20/2024 POST-OPERATIVE DIAGNOSIS:  perforated bowel   PROCEDURE:  Drainage of right abdominal wall abscess as well as intra-abdominal abscess Explantation of abdominal mesh Small bowel resection (in  discontinuity) Placement of ABThera wound VAC   SURGEON:  [name], MD  04/21/2024  POST-OPERATIVE DIAGNOSIS:  DELAYED BOWEL PERFORATION WITH OPEN ABDOMEN   PROCEDURE:   LYSIS OF ADHESIONS X ABDOMINAL WALL DEBRIDEMENT ILEAL RESECTION END ILEOSTOMY ABDOMINAL WALL PARASTOMAL & INCISIONAL HERNIA REPAIRS WITH PHASIX MESH FASCIA CLOSURE WITH WOUND VAC PLACEMENT IN SQ   SURGEON:  [name], MD    05/04/2024 Interventional Radiology Procedure Note   Procedure: Bilateral percutaneous nephrostomy tube placements   Findings: Please refer to procedural dictation for full description. 10 Fr bilateral to bag drainage.   Complications: None immediate   Estimated Blood Loss: < 5 ml   Recommendations: Keep to bag drainage.  IR will follow.  Ultimate management as per Urology.   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  No major purulence but there was some mostly sensitive E. coli on the mesh.  Agree with ceftriaxone .  Might need 6 weeks or transition to oral antibiotic.  I would continue doing the wound VAC in the hopes that will close down and granulate faster.  Granulating over the mesh will decrease infection risk as well.  See if he can tolerate that.  Effluent in the wound VAC is lightly serosanguineous.  I am planning no more operation so I see no reason to keep him on a heparin  drip.  Can transition to oral anticoagulation given  his history of pulmonary emboli will defer to primary service if there are other reasons.  He still  struggles with a high output ileostomy.  Will try tincture of opium .  Continue iron  and fiber.  Try Lomotil  for breakthrough output and see if that can get in control.  I suspect this more reflection of malnutrition since it had been better controlled.  I do not think he is a candidate for ileostomy takedown given his very deconditioned state and a very hostile abdomen.    I worry about his inability to keep himself hydrated or adequate nutrition.  Consider calorie counts and nutrition consultation.  I again planted the seed about considering a feeding PEG/G-tube to make sure he is getting more fluids and eating better until he can get further out.  He is hesitant to do it again.  Discussed with critical care to see what they think.  Try IV fluid boluses daily.  I see no strong evidence of any leak from his fecal ileostomy or his urine urostomy either.  Defer nephrostomy tubes and stenting to urology.  Midline incision wound very superficial now with good granulation.  Continue washing daily and cover with dry gauze/ABD pad   Metabolic encephalopathy resolved.  Follow.  Insomnia improved  ABLA on top of anemia of chronic disease nearly normal at this point.  Hold off on extra supplementation     -monitor electrolytes & replace as needed.  Keep K>4, Mg>2, Phos>3.    -Diabetes.  Sliding scale insulin .    -VTE prophylaxis-  Full anticoagulation given history of pulmonary embolism.  Eliquis .  -Mobilize as tolerated.  He remains very deconditioned and low motivation to exercise.  Hopefully therapies can work with him more to get him stronger.  Wife strongly agrees  I updated the patient's status to the the patient's wife and nurse.  Recommendations were made.  Questions were answered.  They expressed understanding & appreciation.  -Disposition:    I reviewed nursing notes, last 24 h vitals and pain scores, last 48 h intake and output, last 24 h labs and trends, and last 24 h imaging  results.  I have reviewed this patient's available data, including medical history, events of note, test results, etc as part of my evaluation.   A significant portion of that time was spent in counseling. Care during the described time interval was provided by me.  This care required high  level of medical decision making.  09/28/2024    Subjective: (Chief complaint)  No major events.  Claims to be eating and drinking.  I think he is just nibbling on Cheerios.  Wife in room.  Denies much discomfort.  Claims to be getting out of bed.   Objective:  Vital signs:  Vitals:   09/28/24 0400 09/28/24 0500 09/28/24 0600 09/28/24 0745  BP: (!) 85/55 (!) 85/50 (!) 87/54   Pulse: 65 68 71   Resp: 16 19 16    Temp:    (!) 96.7 F (35.9 C)  TempSrc:    Axillary  SpO2: 97% 98% 97%   Weight:      Height:        Last BM Date : 09/27/24  Intake/Output   Yesterday:  11/16 0701 - 11/17 0700 In: 2765.8 [I.V.:2563.7; IV Piggyback:202] Out: 5875 [Urine:3250; Drains:200; Stool:2425] This shift:  No intake/output data recorded.  Bowel function:  Flatus: YES  BM:  YES thin watery feculent output right upper quadrant of fecal ileostomy   Physical Exam:  General:  Thin but not frankly cachectic.  Resting in no acute distress.  More alert and interactive.  Oriented x 4 Eyes: Glasses PERRL, normal EOM.  Sclera clear.  No icterus Neuro: CN II-XII intact w/o focal sensory/motor deficits.  Remains very hard of hearing. Lymph: No head/neck/groin lymphadenopathy Psych:  No delerium/psychosis/paranoia.  No agitation HENT: Normocephalic, Mucus membranes moist.  No thrush.  Remains severely hard of hearing  Neck: Supple, No tracheal deviation.  No obvious thyromegaly Chest: No pain to chest wall compression.  Good respiratory excursion.   CV:  Pulses intact.  Regular rhythm.  No extremity edema MS:  No obvious deformity  Abdomen:  Obese Soft.  Nondistended.  No tenderness nor  guarding. Midline abd wound 7 x 3 cm very superficial and clean  Right upper quadrant Ileostomy with watery tea-colored effluent Right lower quadrant ileal conduit urostomy with stent out and some hematuria draining   Right flank wound VAC in place with very light serosanguineous output.  No purulence.  Ext:   No deformity.  No edema.  No cyanosis Skin: No petechiae / purpurea.  No major sores.  Warm and dry    Results:   Cultures: Recent Results (from the past 720 hours)  Blood Culture (routine x 2)     Status: None   Collection Time: 09/21/24 12:32 PM   Specimen: BLOOD  Result Value Ref Range Status   Specimen Description BLOOD SITE NOT SPECIFIED  Final   Special Requests   Final    BOTTLES DRAWN AEROBIC AND ANAEROBIC Blood Culture adequate volume   Culture   Final    NO GROWTH 5 DAYS Performed at [facility], [address], [place], [place] [address]    Report Status 09/26/2024 FINAL  Final  Urine Culture     Status: Abnormal   Collection Time: 09/21/24 12:43 PM   Specimen: Urine, Random  Result Value Ref Range Status   Specimen Description URINE, RANDOM  Final   Special Requests   Final    URINE, RANDOM Performed at [facility], [address] [place], [place] [address]    Culture >=100,000 COLONIES/mL ESCHERICHIA COLI (A)  Final   Report Status 09/23/2024 FINAL  Final   Organism ID, Bacteria ESCHERICHIA COLI (A)  Final      Susceptibility   Escherichia coli - MIC*    AMPICILLIN 16 INTERMEDIATE Intermediate     CEFAZOLIN  (URINE) Value in next row Sensitive      2 SENSITIVEThis is a modified FDA-approved test that has been validated and its performance characteristics determined by the reporting laboratory.  This laboratory is certified under the Clinical Laboratory Improvement Amendments CLIA as qualified to perform high complexity clinical laboratory testing.    CEFEPIME  Value in next row Sensitive      2 SENSITIVEThis is a modified FDA-approved test  that has been validated and its performance characteristics determined by the reporting laboratory.  This laboratory is certified under the Clinical Laboratory Improvement Amendments CLIA as qualified to perform high complexity clinical laboratory testing.    ERTAPENEM Value in next row Sensitive      2 SENSITIVEThis is a modified FDA-approved test that has been validated and its performance characteristics determined by the reporting laboratory.  This laboratory is certified under the Clinical Laboratory Improvement Amendments CLIA as qualified to perform high complexity clinical laboratory testing.    CEFTRIAXONE  Value in next row Sensitive      2 SENSITIVEThis is a modified FDA-approved test  that has been validated and its performance characteristics determined by the reporting laboratory.  This laboratory is certified under the Clinical Laboratory Improvement Amendments CLIA as qualified to perform high complexity clinical laboratory testing.    CIPROFLOXACIN  Value in next row Sensitive      2 SENSITIVEThis is a modified FDA-approved test that has been validated and its performance characteristics determined by the reporting laboratory.  This laboratory is certified under the Clinical Laboratory Improvement Amendments CLIA as qualified to perform high complexity clinical laboratory testing.    GENTAMICIN  Value in next row Sensitive      2 SENSITIVEThis is a modified FDA-approved test that has been validated and its performance characteristics determined by the reporting laboratory.  This laboratory is certified under the Clinical Laboratory Improvement Amendments CLIA as qualified to perform high complexity clinical laboratory testing.    NITROFURANTOIN Value in next row Sensitive      2 SENSITIVEThis is a modified FDA-approved test that has been validated and its performance characteristics determined by the reporting laboratory.  This laboratory is certified under the Clinical Laboratory Improvement  Amendments CLIA as qualified to perform high complexity clinical laboratory testing.    TRIMETH /SULFA  Value in next row Sensitive      2 SENSITIVEThis is a modified FDA-approved test that has been validated and its performance characteristics determined by the reporting laboratory.  This laboratory is certified under the Clinical Laboratory Improvement Amendments CLIA as qualified to perform high complexity clinical laboratory testing.    AMPICILLIN/SULBACTAM Value in next row Sensitive      2 SENSITIVEThis is a modified FDA-approved test that has been validated and its performance characteristics determined by the reporting laboratory.  This laboratory is certified under the Clinical Laboratory Improvement Amendments CLIA as qualified to perform high complexity clinical laboratory testing.    PIP/TAZO Value in next row Sensitive      <=4 SENSITIVEThis is a modified FDA-approved test that has been validated and its performance characteristics determined by the reporting laboratory.  This laboratory is certified under the Clinical Laboratory Improvement Amendments CLIA as qualified to perform high complexity clinical laboratory testing.    MEROPENEM Value in next row Sensitive      <=4 SENSITIVEThis is a modified FDA-approved test that has been validated and its performance characteristics determined by the reporting laboratory.  This laboratory is certified under the Clinical Laboratory Improvement Amendments CLIA as qualified to perform high complexity clinical laboratory testing.    * >=100,000 COLONIES/mL ESCHERICHIA COLI  C Difficile Quick Screen w PCR reflex     Status: None   Collection Time: 09/21/24  7:47 PM   Specimen: STOOL  Result Value Ref Range Status   C Diff antigen NEGATIVE NEGATIVE Final   C Diff toxin NEGATIVE NEGATIVE Final   C Diff interpretation No C. difficile detected.  Final    Comment: Performed at [facility], [address] [place], [place] [address]   Gastrointestinal Panel by PCR , Stool     Status: None   Collection Time: 09/21/24  7:47 PM   Specimen: STOOL  Result Value Ref Range Status   Campylobacter species NOT DETECTED NOT DETECTED Final   Plesimonas shigelloides NOT DETECTED NOT DETECTED Final   Salmonella species NOT DETECTED NOT DETECTED Final   Yersinia enterocolitica NOT DETECTED NOT DETECTED Final   Vibrio species NOT DETECTED NOT DETECTED Final   Vibrio cholerae NOT DETECTED NOT DETECTED Final   Enteroaggregative E coli (EAEC) NOT DETECTED NOT DETECTED  Final   Enteropathogenic E coli (EPEC) NOT DETECTED NOT DETECTED Final   Enterotoxigenic E coli (ETEC) NOT DETECTED NOT DETECTED Final   Shiga like toxin producing E coli (STEC) NOT DETECTED NOT DETECTED Final   Shigella/Enteroinvasive E coli (EIEC) NOT DETECTED NOT DETECTED Final   Cryptosporidium NOT DETECTED NOT DETECTED Final   Cyclospora cayetanensis NOT DETECTED NOT DETECTED Final   Entamoeba histolytica NOT DETECTED NOT DETECTED Final   Giardia lamblia NOT DETECTED NOT DETECTED Final   Adenovirus F40/41 NOT DETECTED NOT DETECTED Final   Astrovirus NOT DETECTED NOT DETECTED Final   Norovirus GI/GII NOT DETECTED NOT DETECTED Final   Rotavirus A NOT DETECTED NOT DETECTED Final   Sapovirus (I, II, IV, and V) NOT DETECTED NOT DETECTED Final    Comment: Performed at [facility], [address] [place], [place] [address]  Blood Culture (routine x 2)     Status: None   Collection Time: 09/21/24 11:14 PM   Specimen: BLOOD RIGHT HAND  Result Value Ref Range Status   Specimen Description BLOOD RIGHT HAND  Final   Special Requests   Final    BOTTLES DRAWN AEROBIC AND ANAEROBIC Blood Culture adequate volume   Culture   Final    NO GROWTH 5 DAYS Performed at [facility], [address], [place], [place] [address]    Report Status 09/26/2024 FINAL  Final  Surgical pcr screen     Status: None   Collection Time: 09/24/24  7:36 AM   Specimen: Nasal  Mucosa; Nasal Swab  Result Value Ref Range Status   MRSA, PCR NEGATIVE NEGATIVE Final   Staphylococcus aureus NEGATIVE NEGATIVE Final    Comment: (NOTE) The Xpert SA Assay (FDA approved for NASAL specimens in patients 36 years of age and older), is one component of a comprehensive surveillance program. It is not intended to diagnose infection nor to guide or monitor treatment. Performed at [facility], [address] [place], [place] [address]   Aerobic/Anaerobic Culture w Gram Stain (surgical/deep wound)     Status: None (Preliminary result)   Collection Time: 09/24/24  5:26 PM   Specimen: Path Tissue  Result Value Ref Range Status   Specimen Description WOUND ABDOMEN  Final   Special Requests SWAB OF ABDOMINAL WALL MESH PHASIX PT ON ZOSYN   Final   Gram Stain   Final    RARE WBC PRESENT, PREDOMINANTLY PMN NO ORGANISMS SEEN Performed at [facility], [address] [place], [place] [address]    Culture   Final    RARE ESCHERICHIA COLI NO ANAEROBES ISOLATED; CULTURE IN PROGRESS FOR 5 DAYS    Report Status PENDING  Incomplete   Organism ID, Bacteria ESCHERICHIA COLI  Final      Susceptibility   Escherichia coli - MIC*    AMPICILLIN 16 INTERMEDIATE Intermediate     CEFAZOLIN  (NON-URINE) 4 INTERMEDIATE Intermediate     CEFEPIME  <=0.12 SENSITIVE Sensitive     ERTAPENEM <=0.12 SENSITIVE Sensitive     CEFTRIAXONE  <=0.25 SENSITIVE Sensitive     CIPROFLOXACIN  <=0.06 SENSITIVE Sensitive     GENTAMICIN  <=1 SENSITIVE Sensitive     MEROPENEM <=0.25 SENSITIVE Sensitive     TRIMETH /SULFA  <=20 SENSITIVE Sensitive     AMPICILLIN/SULBACTAM 8 SENSITIVE Sensitive     PIP/TAZO Value in next row Sensitive      <=4 SENSITIVEThis is a modified FDA-approved test that has been validated and its performance characteristics determined by the reporting laboratory.  This laboratory is certified under the Clinical Laboratory Improvement Amendments CLIA as qualified to perform high  complexity clinical laboratory testing.    * RARE ESCHERICHIA COLI     Labs: Results for orders placed or performed during the hospital encounter of 09/21/24 (from the past 48 hours)  Basic metabolic panel     Status: Abnormal   Collection Time: 09/26/24  8:57 AM  Result Value Ref Range   Sodium 137 135 - 145 mmol/L   Potassium 4.0 3.5 - 5.1 mmol/L   Chloride 115 (H) 98 - 111 mmol/L   CO2 13 (L) 22 - 32 mmol/L   Glucose, Bld 106 (H) 70 - 99 mg/dL    Comment: Glucose reference range applies only to samples taken after fasting for at least 8 hours.   BUN 15 8 - 23 mg/dL   Creatinine, Ser 8.62 (H) 0.61 - 1.24 mg/dL   Calcium  8.5 (L) 8.9 - 10.3 mg/dL   GFR, Estimated 55 (L) >60 mL/min    Comment: (NOTE) Calculated using the CKD-EPI Creatinine Equation (2021)    Anion gap 9 5 - 15    Comment: Performed at [facility], [address], [place], [place] [address]  Phosphorus     Status: Abnormal   Collection Time: 09/26/24  8:57 AM  Result Value Ref Range   Phosphorus 2.4 (L) 2.5 - 4.6 mg/dL    Comment: Performed at [facility], [address], [place], [place] [address]  Magnesium      Status: Abnormal   Collection Time: 09/26/24  8:57 AM  Result Value Ref Range   Magnesium  1.6 (L) 1.7 - 2.4 mg/dL    Comment: Performed at [facility], [address], [place], [place] [address]  CBC     Status: Abnormal   Collection Time: 09/27/24  2:19 AM  Result Value Ref Range   WBC 7.3 4.0 - 10.5 K/uL   RBC 3.08 (L) 4.22 - 5.81 MIL/uL   Hemoglobin 7.9 (L) 13.0 - 17.0 g/dL   HCT 75.0 (L) 60.9 - 47.9 %   MCV 80.8 80.0 - 100.0 fL   MCH 25.6 (L) 26.0 - 34.0 pg   MCHC 31.7 30.0 - 36.0 g/dL   RDW 81.4 (H) 88.4 - 84.4 %   Platelets 229 150 - 400 K/uL   nRBC 0.0 0.0 - 0.2 %    Comment: Performed at [facility], [address] [place], [place] [address]  Heparin  level (unfractionated)     Status: Abnormal   Collection Time: 09/27/24  2:19 AM  Result Value Ref Range    Heparin  Unfractionated 0.26 (L) 0.30 - 0.70 IU/mL    Comment: (NOTE) The clinical reportable range upper limit is being lowered to >1.10 to align with the FDA approved guidance for the current laboratory assay.  If heparin  results are below expected values, and patient dosage has  been confirmed, suggest follow up testing of antithrombin III levels. Performed at [facility], [address], [place], [place] [address]   Basic metabolic panel     Status: Abnormal   Collection Time: 09/27/24  2:19 AM  Result Value Ref Range   Sodium 140 135 - 145 mmol/L   Potassium 4.0 3.5 - 5.1 mmol/L   Chloride 115 (H) 98 - 111 mmol/L   CO2 15 (L) 22 - 32 mmol/L   Glucose, Bld 103 (H) 70 - 99 mg/dL    Comment: Glucose reference range applies  only to samples taken after fasting for at least 8 hours.   BUN 17 8 - 23 mg/dL   Creatinine, Ser 8.53 (H) 0.61 - 1.24 mg/dL   Calcium  8.7 (L) 8.9 - 10.3 mg/dL   GFR, Estimated 51 (L) >60 mL/min    Comment: (NOTE) Calculated using the CKD-EPI Creatinine Equation (2021)    Anion gap 10 5 - 15    Comment: Performed at [facility], [address] [place], [place] [address]  Magnesium      Status: None   Collection Time: 09/27/24  2:19 AM  Result Value Ref Range   Magnesium  2.0 1.7 - 2.4 mg/dL    Comment: Performed at [facility], [address] [place], [place] [address]  Phosphorus     Status: None   Collection Time: 09/27/24  2:19 AM  Result Value Ref Range   Phosphorus 2.5 2.5 - 4.6 mg/dL    Comment: Performed at [facility], [address], [place], [place] [address]  Lactic acid, plasma     Status: None   Collection Time: 09/27/24  2:19 AM  Result Value Ref Range   Lactic Acid, Venous 1.3 0.5 - 1.9 mmol/L    Comment: Performed at [facility], [address] [place], [place] [address]  Heparin  level (unfractionated)     Status: None   Collection Time: 09/27/24  4:21 PM  Result Value Ref Range   Heparin   Unfractionated 0.37 0.30 - 0.70 IU/mL    Comment: (NOTE) The clinical reportable range upper limit is being lowered to >1.10 to align with the FDA approved guidance for the current laboratory assay.  If heparin  results are below expected values, and patient dosage has  been confirmed, suggest follow up testing of antithrombin III levels. Performed at [facility], [address], [place], [place] [address]   Heparin  level (unfractionated)     Status: None   Collection Time: 09/28/24  2:22 AM  Result Value Ref Range   Heparin  Unfractionated 0.34 0.30 - 0.70 IU/mL    Comment: (NOTE) The clinical reportable range upper limit is being lowered to >1.10 to align with the FDA approved guidance for the current laboratory assay.  If heparin  results are below expected values, and patient dosage has  been confirmed, suggest follow up testing of antithrombin III levels. Performed at [facility], [address] [place], [place] [address]   Basic metabolic panel     Status: Abnormal   Collection Time: 09/28/24  2:22 AM  Result Value Ref Range   Sodium 140 135 - 145 mmol/L   Potassium 3.8 3.5 - 5.1 mmol/L   Chloride 118 (H) 98 - 111 mmol/L   CO2 16 (L) 22 - 32 mmol/L   Glucose, Bld 92 70 - 99 mg/dL    Comment: Glucose reference range applies only to samples taken after fasting for at least 8 hours.   BUN 16 8 - 23 mg/dL   Creatinine, Ser 8.60 (H) 0.61 - 1.24 mg/dL   Calcium  8.5 (L) 8.9 - 10.3 mg/dL   GFR, Estimated 54 (L) >60 mL/min    Comment: (NOTE) Calculated using the CKD-EPI Creatinine Equation (2021)    Anion gap 6 5 - 15    Comment: Performed at [facility], [address] [place], [place] [address]  Magnesium      Status: None   Collection Time: 09/28/24  2:22 AM  Result Value Ref Range  Magnesium  1.7 1.7 - 2.4 mg/dL    Comment: Performed at [facility], [address] [place], [place] [address]  Phosphorus     Status: None   Collection Time:  09/28/24  2:22 AM  Result Value Ref Range   Phosphorus 2.5 2.5 - 4.6 mg/dL    Comment: Performed at [facility], [address], [place], [place] [address]  CBC     Status: Abnormal   Collection Time: 09/28/24  2:22 AM  Result Value Ref Range   WBC 7.4 4.0 - 10.5 K/uL   RBC 3.09 (L) 4.22 - 5.81 MIL/uL   Hemoglobin 8.0 (L) 13.0 - 17.0 g/dL   HCT 74.9 (L) 60.9 - 47.9 %   MCV 80.9 80.0 - 100.0 fL   MCH 25.9 (L) 26.0 - 34.0 pg   MCHC 32.0 30.0 - 36.0 g/dL   RDW 81.3 (H) 88.4 - 84.4 %   Platelets 210 150 - 400 K/uL   nRBC 0.0 0.0 - 0.2 %    Comment: Performed at [facility], [address], [place], [place] [address]    Imaging / Studies: No results found.          Medications / Allergies: per chart  Antibiotics: Anti-infectives (From admission, onward)    Start     Dose/Rate Route Frequency Ordered Stop   09/27/24 2200  cefTRIAXone  (ROCEPHIN ) 2 g in sodium chloride  0.9 % 100 mL IVPB        2 g 200 mL/hr over 30 Minutes Intravenous Every 24 hours 09/27/24 1442     09/22/24 1600  piperacillin -tazobactam (ZOSYN ) IVPB 3.375 g  Status:  Discontinued        3.375 g 12.5 mL/hr over 240 Minutes Intravenous Every 8 hours 09/22/24 1501 09/27/24 1442         Note: Portions of this report may have been transcribed using voice recognition software. Every effort was made to ensure accuracy; however, inadvertent computerized transcription errors may be present.   Any transcriptional errors that result from this process are unintentional.    Elspeth KYM Schultze, MD, FACS, MASCRS Esophageal, Gastrointestinal & Colorectal Surgery Robotic and Minimally Invasive Surgery  [facility] [address], [address] [place], [place] [address] [phone] Fax [phone] Main  CONTACT INFORMATION: Weekday (9AM-5PM): Call [facility] main office at [phone] Weeknight (5PM-9AM) or Weekend/Holiday: Check EPIC Web Links tab  & use AMION (password  [email or web address]) for General Surgery [facility] coverage  Please, DO NOT use SecureChat  (it is not reliable communication to reach operating surgeons & will lead to a delay in care).   Epic staff messaging available for outptient concerns needing 1-2 business day response.      09/28/2024  8:17 AM

## 2024-09-28 NOTE — Progress Notes (Signed)
 OT Cancellation Note  Patient Details Name: LINKEN MCGLOTHEN MRN: [id number] DOB: Apr 09, 1951   Cancelled Treatment:    Reason Eval/Treat Not Completed: Pain limiting ability to participate. Pt reporting VERY HIGH pain due to wound vac change. OT will continue to follow acutely and return as schedule allows.   [name] PARAS Milanya Sunderland 09/28/2024, 11:42 AM  [name] DEL OTR/L Acute Rehabilitation Services Office: [phone]

## 2024-09-28 NOTE — Progress Notes (Signed)
 Nutrition Follow-up  DOCUMENTATION CODES:   Severe malnutrition in context of chronic illness  INTERVENTION:  Continue Juven BID  Continue daily MVI w/ minerals Added Florastor probiotic  May benefit from Oral Rehydration Solutions, recipes below per the Academy of Nutrition and Dietetics 2 cups Gatorade + 2 cups water  +  teaspoon salt  3 cups water  + 1 cup orange juice +  teaspoon salt +  teaspoon baking soda   cup grape juice or cranberry juice + 3 cups water  +  teaspoon salt  Encouraged adequate intake of meals and supplements to meet calorie and protein needs Wife bringing in CorePower shakes from home  Pt states he does not want to pursue PEG placement Is willing to try increasing CorePower intake  Would like to try to work on PO intake first; encouraged protein intake at every meal  NUTRITION DIAGNOSIS:   Severe Malnutrition related to chronic illness as evidenced by energy intake < or equal to 75% for > or equal to 1 month, severe muscle depletion, moderate fat depletion, percent weight loss.  Remains applicable  GOAL:   Patient will meet greater than or equal to 90% of their needs  Progressing  MONITOR:   PO intake, Supplement acceptance, Labs, Weight trends, Skin  REASON FOR ASSESSMENT:   Consult Assessment of nutrition requirement/status, Poor PO, Wound healing  ASSESSMENT:   Patient presented with N/V/abdominal pain, weakness and FTT and was found to have chronic abdominal wall/fistula infection with I&D and exploration pending, and delayed wound healing. PMH significant for recent admission 07/2024 for debility and abdominal wall drainage, HFpEF, CAD, T2DM, PE, prostate cancer, bladder cancer s/p cystoprostatectomy and ileal conduit (2013), recent admission 04/10/24 for parastomal-incisional abdominal wall hernia repair w/graft and ileal conduit reversal c/b intra-abdominal and abdominal wall abscess from bowel perforation s/p end ileostomy.  11/10  admitted 11/12 transferred to ICU 11/13 s/p I & D   Pt remains in ICU due to need for intermittent pressor support.   Pt with previous experience requiring Cortrak use. During CIR admission, pt declined to the point he had no energy and could not eat and would barely open his eyes. Pt received tube feeds for 5 days which drastically improved pt's energy level to the point where he was eating well and consistently meeting needs PO so Cortrak was then discontinued. MD recommended PEG to help with consistent fluid and nutrition intake, but pt expressed he does not want another tube. Discussed that relying on IV electrolyte repletion is not sustainable, but pt wants to work on PO intake first. Pt has continued to lose wt since last admission and meets criteria for severe malnutrition, pt has a high risk for continued decline if nutrition status does not improve. Pt does not want magic cups, but willing to drink more CorePower shakes per day.  Pt and wife report pt had been doing well at home leading up to admission but when pt started experiencing nausea he was unable to tolerate PO medications which then led to significant increase in ileostomy output. Pt's PO intake is significantly improved compared to last admission, but electrolyte abnormalities still present likely in the setting of high ileostomy output. Would recommend oral rehydration solution to ensure proper electrolyte replacement from high output. Pt may also benefit from probiotic given chronic use of antibiotics.   Average meal Completion: 11/11-11/15: 75% average intake x 4 recorded meals No recently recorded meals since 11/15   Medications: Vitamin B12 3 x weekly Ferrous sulfate  325 mg  BID Imodium  Midodrine  TID Juven BID MVI w/ minerals daily Fibercon Zoloft  D5 Ceftriaxone   Magnesium  sulfate IVPB  Labs reviewed: Potassium 3.8<--4.8 Phosphorus 2.5<--2.4<--2.3 Magnesium  1.7<--2.0<--1.6  **per surgery, want to Keep K>4,  Mg>2, Phos>3**  Drains: Urostomy: 1375 ml x 24 h Nephrostomy: 1875 ml x 24 h Ileostomy: 2425 ml x 24 h Wound VAC: 200 ml x 24 h   Diet Order:   Diet Order             Diet regular Room service appropriate? Yes; Fluid consistency: Thin  Diet effective now                   EDUCATION NEEDS:   Education needs have been addressed  Skin:  Skin Assessment: Skin Integrity Issues: Skin Integrity Issues:: Incisions Incisions: Flank  Last BM:  2425 ml output in ileostomy x 24 h  Height:   Ht Readings from Last 1 Encounters:  09/24/24 5' 11 (1.803 m)    Weight:   Wt Readings from Last 1 Encounters:  09/26/24 83.2 kg    Ideal Body Weight:  78 kg  BMI:  Body mass index is 25.58 kg/m.  Estimated Nutritional Needs:   Kcal:  2000-2200  Protein:  100-120g  Fluid:  > 2L    Josette Glance, MS, RDN, LDN Clinical Dietitian I Please reach out via secure chat

## 2024-09-28 NOTE — Progress Notes (Signed)
NAME:  Kristopher Thompson, MRN:  [id number], DOB:  [birth year], LOS: 7 ADMISSION DATE:  09/21/2024, CONSULTATION DATE: 09/23/2024 REFERRING MD: Family medicine team, CHIEF COMPLAINT: Recurrent abdominal abscess  History of Present Illness:  [name] is a 73 year old male with a history complex medical history including bladder CA s/p cystectomy with ileal conduit-2018, prostate CA s/p prostatectomy, recent admission 7/25 for incarcerated incisional, parasternal parastomal and inguinal hernia with subsequent ileostomy, urostomy with ileal conduit revision, presents with nausea vomiting and abdominal pain and is being managed 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 Pending abdominal wall flank I&D and washout for 11/13 Critical care team is brought on board for persistent hypotension refractory to fluids  Pertinent  Medical History  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  Interim History / Subjective:  Off Levophed  11/16 Still with significant output from the ostomy Opiate intravenous started Awake alert interactive, remains on room air  Objective    Blood pressure (!) 87/54, pulse 71, temperature (!) 96.7 F (35.9 C), temperature source Axillary, resp. rate 16, height 5' 11 (1.803 m), weight 83.2 kg, SpO2 97%.        Intake/Output Summary (Last 24 hours) at 09/28/2024 0845 Last data filed at 09/28/2024 0600 Gross per 24 hour   Intake 2765.75 ml  Output 5675 ml  Net -2909.25 ml   Filed Weights   09/21/24 1155 09/24/24 1554 09/26/24 0500  Weight: 74.8 kg 72.6 kg 83.2 kg    Examination: General: Awake alert and oriented HEENT: Moist oral mucosa CV: S1-S2 appreciated pulm: Clear breath sounds Abs: Bowel sounds appreciated, high output ileostomy, midline dressing in place  Extremities: Lower extremity edema Skin: Left forearm phlebitis improving Neuro: Alert and oriented x 3 overall weak GU: urostomy   I reviewed last 24 h vitals and pain scores, last 48 h intake and output, last 24 h labs and trends, and last 24 h imaging results.  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 Sheldon, wants to transition of continuous fluid and to bolus as needed -Measures to slow down output being continued-Lomatuell 2 mg twice daily  History of pulmonary embolism - On heparin  - Will switch back to Eliquis  - No plans for any invasive intervention at present  Hypercalcemia Hypophosphatemia - Received calcitonin on 11/1 - Continue to trend calcium  and phosphorus levels  Left arm phlebitis - Improving - Warm compresses - Elevate   acute kidney injury - Trending better - Optimize renal perfusion - Avoid nephrotoxic medications  If hemodynamics remain stable will consider transferring out of the ICU   Labs  CBC: Recent Labs  Lab 09/21/24 1232 09/21/24 1526 09/24/24 0220 09/25/24 0224  09/26/24 0213 09/27/24 0219 09/28/24 0222  WBC 12.7*   < > 6.9 10.1 8.3 7.3 7.4  NEUTROABS 10.1*  --   --   --   --   --   --   HGB 13.1   < > 8.0* 8.8* 8.5* 7.9* 8.0*  HCT 41.5   < > 25.1* 27.4* 27.9* 24.9* 25.0*  MCV 79.5*   < > 79.7* 79.4* 84.0 80.8 80.9  PLT 460*   < > 214 252 206 229 210   < > = values in this interval not displayed.    Basic Metabolic Panel: Recent Labs  Lab 09/24/24 1936 09/25/24 0224 09/26/24 0857 09/27/24 0219 09/28/24 0222  NA 136 133* 137 140 140  K 4.1 4.8 4.0 4.0 3.8  CL 112* 113* 115* 115* 118*  CO2 16* 14* 13* 15* 16*  GLUCOSE 96 178* 106* 103* 92  BUN 21 19 15 17 16   CREATININE 1.50* 1.50* 1.37* 1.46* 1.39*  CALCIUM  8.8* 8.8* 8.5* 8.7* 8.5*  MG 2.1 2.0 1.6* 2.0 1.7  PHOS 2.4* 2.3* 2.4* 2.5 2.5   GFR: Estimated Creatinine Clearance: 51.2 mL/min (A) (by C-G formula based on SCr of 1.39 mg/dL (H)). Recent Labs  Lab 09/21/24 1453 09/22/24 0442 09/23/24 2022 09/24/24 0220 09/25/24 0224 09/26/24 0213 09/27/24 0219 09/28/24 0222  WBC  --    < > 6.3 6.9 10.1 8.3 7.3 7.4  LATICACIDVEN 1.9  --  1.3 0.6  --   --  1.3  --    < > = values in this interval not displayed.    Liver Function Tests: Recent Labs  Lab 09/21/24 1232 09/22/24 0442  AST 26 19  ALT 25 18  ALKPHOS 119 79  BILITOT 0.5 0.6  PROT 8.8* 6.6  ALBUMIN  4.2 2.9*   Recent Labs  Lab 09/21/24 1232  LIPASE 22   No results for input(s): AMMONIA in the last 168 hours.  ABG    Component Value Date/Time   PHART 7.28 (L) 04/21/2024 1715   PCO2ART 47 04/21/2024 1715   PO2ART 75 (L) 04/21/2024 1715   HCO3 14.2 (L) 05/26/2024 0423   TCO2 21 (L) 09/21/2024 1526   ACIDBASEDEF 10.1 (H) 05/26/2024 0423   O2SAT 90.5 05/26/2024 0423     Coagulation Profile: Recent Labs  Lab 09/21/24 1232  INR 1.3*    Cardiac Enzymes: No results for input(s): CKTOTAL, CKMB, CKMBINDEX, TROPONINI in the last 168 hours.  HbA1C: Hgb A1c MFr Bld  Date/Time Value Ref Range  Status  04/01/2024 10:50 AM 5.3 4.8 - 5.6 % Final    Comment:    (NOTE) Pre diabetes:          5.7%-6.4%  Diabetes:              >6.4%  Glycemic control for   <7.0% adults with diabetes     CBG: Recent Labs  Lab 09/23/24 2116 09/24/24 1758  GLUCAP 98 74   The patient is critically ill with multiple organ systems failure and requires high complexity decision making for assessment and support, frequent evaluation and titration of therapies, application of advanced monitoring technologies and extensive interpretation of multiple databases. Critical Care Time devoted to patient care services described in this note independent of APP/resident time (if applicable)  is 32 minutes.   Jennet Epley MD [facility] Personal pager: See Amion If unanswered, please page CCM On-call: #  [phone]       

## 2024-09-29 DIAGNOSIS — E860 Dehydration: Secondary | ICD-10-CM

## 2024-09-29 LAB — AEROBIC/ANAEROBIC CULTURE W GRAM STAIN (SURGICAL/DEEP WOUND)

## 2024-09-29 LAB — CBC
HCT: 28.8 % — ABNORMAL LOW (ref 39.0–52.0)
Hemoglobin: 8.9 g/dL — ABNORMAL LOW (ref 13.0–17.0)
MCH: 25.4 pg — ABNORMAL LOW (ref 26.0–34.0)
MCHC: 30.9 g/dL (ref 30.0–36.0)
MCV: 82.1 fL (ref 80.0–100.0)
Platelets: 238 K/uL (ref 150–400)
RBC: 3.51 MIL/uL — ABNORMAL LOW (ref 4.22–5.81)
RDW: 18.6 % — ABNORMAL HIGH (ref 11.5–15.5)
WBC: 7.3 K/uL (ref 4.0–10.5)
nRBC: 0 % (ref 0.0–0.2)

## 2024-09-29 LAB — BASIC METABOLIC PANEL WITH GFR
Anion gap: 7 (ref 5–15)
BUN: 22 mg/dL (ref 8–23)
CO2: 17 mmol/L — ABNORMAL LOW (ref 22–32)
Calcium: 8.8 mg/dL — ABNORMAL LOW (ref 8.9–10.3)
Chloride: 114 mmol/L — ABNORMAL HIGH (ref 98–111)
Creatinine, Ser: 1.29 mg/dL — ABNORMAL HIGH (ref 0.61–1.24)
GFR, Estimated: 59 mL/min — ABNORMAL LOW (ref >60)
Glucose, Bld: 87 mg/dL (ref 70–99)
Potassium: 4.1 mmol/L (ref 3.5–5.1)
Sodium: 138 mmol/L (ref 135–145)

## 2024-09-29 LAB — GLUCOSE, CAPILLARY
Glucose-Capillary: 103 mg/dL — ABNORMAL HIGH (ref 70–99)
Glucose-Capillary: 85 mg/dL (ref 70–99)

## 2024-09-29 LAB — MAGNESIUM: Magnesium: 1.9 mg/dL (ref 1.7–2.4)

## 2024-09-29 LAB — HEMOGLOBIN A1C
Hgb A1c MFr Bld: 5.2 % (ref 4.8–5.6)
Mean Plasma Glucose: 102.54 mg/dL

## 2024-09-29 LAB — PHOSPHORUS: Phosphorus: 3.2 mg/dL (ref 2.5–4.6)

## 2024-09-29 MED ORDER — INSULIN ASPART 100 UNIT/ML IJ SOLN: 0.0000 [IU] | Freq: Every day | INTRAMUSCULAR | Status: DC

## 2024-09-29 MED ORDER — INSULIN ASPART 100 UNIT/ML IJ SOLN
0.0000 [IU] | Freq: Three times a day (TID) | INTRAMUSCULAR | Status: DC
  Administered 2024-09-30: 2 [IU] via SUBCUTANEOUS
  Filled 2024-09-29: qty 2

## 2024-09-29 NOTE — Plan of Care (Signed)

## 2024-09-29 NOTE — Care Management Important Message (Signed)
 Important Message  Patient Details  Name: Kristopher Thompson MRN: [id number] Date of Birth: 1951-04-23   Important Message Given:  Yes - Medicare IM     Claretta Deed 09/29/2024, 1:02 PM

## 2024-09-29 NOTE — Progress Notes (Signed)
 Physical Therapy Treatment Patient Details Name: Kristopher Thompson MRN: [id number] DOB: [birth year] Today's Date: 09/29/2024   History of Present Illness 73 y.o. male presenting to [facility] with complaint of nausea/vomiting, weakness, abdominal pain, and failure to thrive; concern for fistula near urostomy and slight increase in right hydronephrosis; 11/12 transferred to ICU due to low BP PMHx: Admit 5/30-7/11 for hernia repair with bowel perforation and DC to snf, readmit 7/14-7/16 with D/C to CIR 7/16-8/8. S/p excision of abdominal wall with cultures 09/24/24. PMH includes HTN, dCHF, CAD, DM2, PE on Eliquis , anemia, obesity, prostate and bladder CA, hernia repair, urostomy, colostomy, RBBB    PT Comments  Pt received supine in bed and agreeable to PT session. Pt is progressing well with PT goals. He is demonstrating good ability to power up into standing, and no swaying or LOB noted throughout. Able to ambulate 14' with CGA and RW, with pt identifying fatigue and need to take seated rest break. With ambulation, pt's HR inc to 160s, which quickly dec back to 110s with seated rest break. Continuing to recommend post-acute rehab >3hrs/day to improve activity tolerance and functional mobility. Acute PT to follow.    If plan is discharge home, recommend the following: A little help with walking and/or transfers;A little help with bathing/dressing/bathroom;Assist for transportation;Assistance with cooking/housework   Can travel by private vehicle        Equipment Recommendations  None recommended by PT    Recommendations for Other Services OT consult     Precautions / Restrictions Precautions Precautions: Fall;Other (comment) (ileostomy, nephrology tube, drain) Recall of Precautions/Restrictions: Intact Restrictions Weight Bearing Restrictions Per Provider Order: No     Mobility  Bed Mobility Overal bed mobility: Needs Assistance Bed Mobility: Supine to Sit     Supine to  sit: +2 for physical assistance, Min assist     General bed mobility comments: +2 for trunk and B LE management today, as well as hip positioning at EOB.    Transfers Overall transfer level: Needs assistance Equipment used: Rolling walker (2 wheels) Transfers: Sit to/from Stand Sit to Stand: Contact guard assist, +2 safety/equipment           General transfer comment: CGA for power up into standing, with hands position on bed to assist himself.    Ambulation/Gait Ambulation/Gait assistance: Contact guard assist Gait Distance (Feet): 15 Feet Assistive device: Rolling walker (2 wheels) Gait Pattern/deviations: Step-through pattern, Trunk flexed, Decreased stride length Gait velocity: Dec Gait velocity interpretation: <1.8 ft/sec, indicate of risk for recurrent falls   General Gait Details: Pt with slow but steady gait and able to identify fatigue to turn around to return to bed.   Stairs             Wheelchair Mobility     Tilt Bed    Modified Rankin (Stroke Patients Only)       Balance Overall balance assessment: Needs assistance Sitting-balance support: No upper extremity supported, Feet supported Sitting balance-Leahy Scale: Fair Sitting balance - Comments: Good static sitting balance   Standing balance support: Bilateral upper extremity supported, During functional activity, Reliant on assistive device for balance Standing balance-Leahy Scale: Poor Standing balance comment: Reliant on external support                            Communication Communication Communication: Impaired Factors Affecting Communication: Hearing impaired  Cognition Arousal: Alert Behavior During Therapy: WFL for tasks assessed/performed, Flat affect  PT - Cognitive impairments: No apparent impairments                       PT - Cognition Comments: Hearing impaired, likely leading to slow responses Following commands: Intact      Cueing Cueing  Techniques: Verbal cues  Exercises General Exercises - Lower Extremity Ankle Circles/Pumps: AROM, 20 reps, Supine, Both Heel Slides: AROM, Supine, Both, 20 reps    General Comments General comments (skin integrity, edema, etc.): Pt's HR inc with ambulation to 160s BPM, but quickly recovered to 110s with seated rest      Pertinent Vitals/Pain Pain Assessment Pain Assessment: Faces Faces Pain Scale: Hurts little more Pain Descriptors / Indicators: Grimacing, Guarding Pain Intervention(s): Limited activity within patient's tolerance, Monitored during session    Home Living                          Prior Function            PT Goals (current goals can now be found in the care plan section) Acute Rehab PT Goals PT Goal Formulation: With patient Time For Goal Achievement: 10/06/24 Potential to Achieve Goals: Good Progress towards PT goals: Progressing toward goals    Frequency    Min 3X/week      PT Plan      Co-evaluation              AM-PAC PT 6 Clicks Mobility   Outcome Measure  Help needed turning from your back to your side while in a flat bed without using bedrails?: A Little Help needed moving from lying on your back to sitting on the side of a flat bed without using bedrails?: A Little Help needed moving to and from a bed to a chair (including a wheelchair)?: A Little Help needed standing up from a chair using your arms (e.g., wheelchair or bedside chair)?: A Little Help needed to walk in hospital room?: A Little Help needed climbing 3-5 steps with a railing? : A Lot 6 Click Score: 17    End of Session Equipment Utilized During Treatment: Gait belt Activity Tolerance: Patient tolerated treatment well Patient left: in bed;with call bell/phone within reach;with bed alarm set Nurse Communication: Mobility status PT Visit Diagnosis: Unsteadiness on feet (R26.81);Other abnormalities of gait and mobility (R26.89);Dizziness and giddiness  (R42);Difficulty in walking, not elsewhere classified (R26.2)     Time: 8570-8545 PT Time Calculation (min) (ACUTE ONLY): 25 min  Charges:    $Therapeutic Activity: 23-37 mins PT General Charges $$ ACUTE PT VISIT: 1 Visit                     Braleigh Massoud, SPT    Kiah Vanalstine 09/29/2024, 4:18 PM

## 2024-09-29 NOTE — Progress Notes (Signed)
 Remains hemodynamically stable  Off pressors  Sitting out of bed in a chair  PCCM signing off

## 2024-09-29 NOTE — Hospital Course (Signed)
 73 y.o. 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 Pertinent past medical history includes bladder cancer status post cystectomy with ileal conduit in 2018, h/o prostate cancer 2013 s/p prostatectomy, recent admission 05-05/2024 for incarcerated incisional, parastomal, and inguinal hernias with subsequent ileostomy, urostomy with ileal conduit revision, complicated by bowel perforation and septic shock requiring pressors and AKI requiring HD as well as urinary leak from conduit and b/l nephrostomy tube placement, 06/2024 admission for severe sepsis 2/2 abdominal wall infection, pyelonephritis and AKI with JP drain cultures +E coli and cornybacterium s/p removal 8/21, urine cultures +MRSA and E faecium, DM2, OSA, HTN, HFpEF CAD, RBBB, h/o PE on eliquis .

## 2024-09-29 NOTE — Progress Notes (Signed)
  Progress Note   Patient: Kristopher Thompson FMW:[id number] DOB: [birth year] DOA: 09/21/2024     8 DOS: the patient was seen and examined on 09/29/2024   Brief hospital course: 73 y.o. 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 Pertinent past medical history includes bladder cancer status post cystectomy with ileal conduit in 2018, h/o prostate cancer 2013 s/p prostatectomy, recent admission 05-05/2024 for incarcerated incisional, parastomal, and inguinal hernias with subsequent ileostomy, urostomy with ileal conduit revision, complicated by bowel perforation and septic shock requiring pressors and AKI requiring HD as well as urinary leak from conduit and b/l nephrostomy tube placement, 06/2024 admission for severe sepsis 2/2 abdominal wall infection, pyelonephritis and AKI with JP drain cultures +E coli and cornybacterium s/p removal 8/21, urine cultures +MRSA and E faecium, DM2, OSA, HTN, HFpEF CAD, RBBB, h/o PE on eliquis .    Assessment and Plan: Abdominal wall abscess with distributive shock -s/p exision of abd wall sinus tract 11/13 -wound cx pos for ecoli with abx de-escalated to rocephin  -Had required pressor support, now weaned off -Continue midodrine . BP stable -General Surgery following. Issues of high ileostomy output remain. Recs for tincture of opium  and lomotl for breakthrough output -Per General Surgery, not candidate for ileostomy takedown given deconditioned state and very hostile abdomen  Hx PE -continued on eliquis   L forearm phlebitis -recommend cont elevation of arm with warm compresses  Hyponatremia -Resolved  Diabetes Type II -Documented history of such -glycemic trends stable -will order SSI coverage as needed     Subjective: Without complaints this AM  Physical Exam: Vitals:   09/29/24 0346 09/29/24 0726 09/29/24 1158 09/29/24 1506   BP: 114/66 (!) 110/58 115/70 110/73  Pulse: 66 76 72 87  Resp: 18 18 16 16   Temp: 98.4 F (36.9 C) 98.4 F (36.9 C) 98.6 F (37 C) 98.6 F (37 C)  TempSrc:  Oral    SpO2: 99% 98% 97% 97%  Weight:      Height:       General exam: Awake, laying in bed, in nad Respiratory system: Normal respiratory effort, no wheezing Cardiovascular system: regular rate, s1, s2 Gastrointestinal system: Soft, nondistended Central nervous system: CN2-12 grossly intact, strength intact Extremities: Perfused, no clubbing Skin: Normal skin turgor, no notable skin lesions seen Psychiatry: Mood normal // affect seems normal  Data Reviewed:  Labs reviewed: Na 138, K 4.1, Cr 1.29, WBC 7.3, Hgb 8.9, Plts 238  Family Communication: Pt in room, family at bedside  Disposition: Status is: Inpatient Remains inpatient appropriate because: severity of illness  Planned Discharge Destination: Unclear at this time    Author: Garnette Pelt, MD 09/29/2024 4:13 PM  For on call review [email or web address].

## 2024-09-29 NOTE — TOC Progression Note (Signed)
 Transition of Care [facility]) - Progression Note    Patient Details  Name: Kristopher Thompson MRN: [id number] Date of Birth: [birth year]  Transition of Care [facility]) CM/SW Contact  Landry DELENA Senters, RN Phone Number: 09/29/2024, 3:19 PM  Clinical Narrative:    Patient continues medical workup. Patient will need to participate in care to work towards d/c to any type of rehab facility. Discharge plan to TBD.   CM will continue to follow.    Expected Discharge Plan: Skilled Nursing Facility                 Expected Discharge Plan and Services                                               Social Drivers of Health (SDOH) Interventions SDOH Screenings   Food Insecurity: No Food Insecurity (09/22/2024)  Housing: Low Risk  (09/22/2024)  Transportation Needs: No Transportation Needs (09/22/2024)  Utilities: At Risk (09/22/2024)  Depression (PHQ2-9): Medium Risk (09/16/2024)  Social Connections: Socially Integrated (09/22/2024)  Tobacco Use: Medium Risk (09/24/2024)    Readmission Risk Interventions    09/22/2024   12:33 PM 05/26/2024    8:59 AM 04/12/2024    4:58 PM  Readmission Risk Prevention Plan  Transportation Screening Complete Complete Complete  PCP or Specialist Appt within 5-7 Days  Complete   PCP or Specialist Appt within 3-5 Days   Complete  Home Care Screening  Complete   Medication Review (RN CM)  Complete   HRI or Home Care Consult   Complete  Social Work Consult for Recovery Care Planning/Counseling   Complete  Palliative Care Screening   Not Applicable  Medication Review Oceanographer) Referral to Pharmacy  Complete  PCP or Specialist appointment within 3-5 days of discharge Complete    HRI or Home Care Consult Complete    SW Recovery Care/Counseling Consult Complete    Palliative Care Screening Not Applicable    Skilled Nursing Facility Not Applicable

## 2024-09-30 DIAGNOSIS — Z932 Ileostomy status: Secondary | ICD-10-CM

## 2024-09-30 DIAGNOSIS — L02211 Cutaneous abscess of abdominal wall: Secondary | ICD-10-CM | POA: Diagnosis not present

## 2024-09-30 DIAGNOSIS — R198 Other specified symptoms and signs involving the digestive system and abdomen: Secondary | ICD-10-CM

## 2024-09-30 DIAGNOSIS — R5381 Other malaise: Secondary | ICD-10-CM | POA: Diagnosis not present

## 2024-09-30 DIAGNOSIS — E43 Unspecified severe protein-calorie malnutrition: Secondary | ICD-10-CM

## 2024-09-30 LAB — GLUCOSE, CAPILLARY
Glucose-Capillary: 91 mg/dL (ref 70–99)
Glucose-Capillary: 132 mg/dL — ABNORMAL HIGH (ref 70–99)
Glucose-Capillary: 94 mg/dL (ref 70–99)
Glucose-Capillary: 99 mg/dL (ref 70–99)

## 2024-09-30 LAB — HEPATIC FUNCTION PANEL
ALT: 14 U/L (ref 0–44)
AST: 15 U/L (ref 15–41)
Albumin: 2.2 g/dL — ABNORMAL LOW (ref 3.5–5.0)
Alkaline Phosphatase: 60 U/L (ref 38–126)
Bilirubin, Direct: <0.1 mg/dL (ref 0.0–0.2)
Total Bilirubin: <0.2 mg/dL (ref 0.0–1.2)
Total Protein: 5.3 g/dL — ABNORMAL LOW (ref 6.5–8.1)

## 2024-09-30 LAB — CBC
HCT: 29.3 % — ABNORMAL LOW (ref 39.0–52.0)
Hemoglobin: 9.1 g/dL — ABNORMAL LOW (ref 13.0–17.0)
MCH: 25.9 pg — ABNORMAL LOW (ref 26.0–34.0)
MCHC: 31.1 g/dL (ref 30.0–36.0)
MCV: 83.2 fL (ref 80.0–100.0)
Platelets: 232 K/uL (ref 150–400)
RBC: 3.52 MIL/uL — ABNORMAL LOW (ref 4.22–5.81)
RDW: 18.6 % — ABNORMAL HIGH (ref 11.5–15.5)
WBC: 7.4 K/uL (ref 4.0–10.5)
nRBC: 0 % (ref 0.0–0.2)

## 2024-09-30 LAB — BASIC METABOLIC PANEL WITH GFR
Anion gap: 11 (ref 5–15)
BUN: 20 mg/dL (ref 8–23)
CO2: 15 mmol/L — ABNORMAL LOW (ref 22–32)
Calcium: 9.1 mg/dL (ref 8.9–10.3)
Chloride: 114 mmol/L — ABNORMAL HIGH (ref 98–111)
Creatinine, Ser: 1.36 mg/dL — ABNORMAL HIGH (ref 0.61–1.24)
GFR, Estimated: 55 mL/min — ABNORMAL LOW (ref >60)
Glucose, Bld: 74 mg/dL (ref 70–99)
Potassium: 4.2 mmol/L (ref 3.5–5.1)
Sodium: 140 mmol/L (ref 135–145)

## 2024-09-30 LAB — PREALBUMIN: Prealbumin: 18 mg/dL (ref 18–38)

## 2024-09-30 LAB — MAGNESIUM: Magnesium: 1.7 mg/dL (ref 1.7–2.4)

## 2024-09-30 LAB — LIPASE, BLOOD: Lipase: 20 U/L (ref 11–51)

## 2024-09-30 LAB — PHOSPHORUS: Phosphorus: 3.2 mg/dL (ref 2.5–4.6)

## 2024-09-30 MED ORDER — SODIUM CHLORIDE 0.9 % IV BOLUS
1000.0000 mL | Freq: Every day | INTRAVENOUS | Status: DC
Start: 2024-09-30 — End: 2024-11-11
  Administered 2024-10-01: 1000 mL via INTRAVENOUS

## 2024-09-30 MED ORDER — IRON SUCROSE 200 MG IVPB - SIMPLE MED
200.0000 mg | Freq: Once | Status: AC
  Administered 2024-09-30: 200 mg via INTRAVENOUS
  Filled 2024-09-30: qty 200

## 2024-09-30 MED ORDER — OPIUM 10 MG/ML (1%) PO TINC
6.0000 [drp] | Freq: Two times a day (BID) | ORAL | Status: DC
Start: 2024-09-30 — End: 2024-09-30

## 2024-09-30 MED ORDER — SODIUM CHLORIDE 0.9 % IV SOLN: INTRAVENOUS | Status: DC

## 2024-09-30 MED ORDER — DIPHENOXYLATE-ATROPINE 2.5-0.025 MG PO TABS
1.0000 | ORAL_TABLET | Freq: Four times a day (QID) | ORAL | Status: DC
  Administered 2024-09-30 (×3): 1 via ORAL
  Filled 2024-09-30 (×3): qty 1

## 2024-09-30 MED ORDER — OPIUM 10 MG/ML (1%) PO TINC
8.0000 [drp] | Freq: Two times a day (BID) | ORAL | Status: DC
Start: 2024-09-30 — End: 2024-10-01
  Administered 2024-09-30 – 2024-10-01 (×2): 4 mg via ORAL
  Filled 2024-09-30 (×2): qty 1

## 2024-09-30 NOTE — Assessment & Plan Note (Signed)
 Midline abdominal wound s/p bowel perforation with open abdomen.  Well-appearing without erythema or drainage with clean bandage on top. - Wound care consult consulted, appreciate recs - Bandage change and wound care has signed off at this time.

## 2024-09-30 NOTE — Assessment & Plan Note (Addendum)
-   Urology consulted, appreciate recommendations  - Continue ongoing volume resuscitation with IV fluids - Hematuria will probably continue since patient is on heparin  gtt - Trend hemoglobin; bleeding from urinary source very mild and most of hemoglobin drop is attributable to hemodilution - General Surgery consult to follow-up for increase in abdominal gas foci - General Surgery recommendations  - Operative abdominal wall I&D and exploration for 4pm Thursday 11/13  - Begin loperamide  and FiberCon to help calm diarrhea and bulk stool - Antibiotics - received Rocephin  IV x1 in ED --> transition to Zosyn  dosed per pharmacy - Blood cultures NGTD - Urine culture + for E. coli - Continue pain control with pain control: Tylenol  1000 mg Q6H PRN, Oxycodone  5-10 mg Q6H PRN - Nausea control with PRN IV compazine   - AM Labs: CBC, BMP - Fall precautions - Daily weight - Delirium precautions - PT/OT consult, appreciate recommendations - 1L NS daily for 6 weeks per GI - Schedule Lomotil  and continue FiberCon and GI iron  to constipate  - Match free water , appreciate RD recs (48hr calorie count) - 125 maintenance fluids  - Consider enteral feeding - Consult ID, recs appreciate  - PICC line? - GOC discussion, LTAC - D/c flexeril

## 2024-09-30 NOTE — Assessment & Plan Note (Signed)
 Chronic, on home midodrine  10mg  TID. Low inpatient blood pressures despite home midodrine  and IVF resuscitation.   - Increase midodrine  from 10mg  TID to 15mg  TID - Continue IVF resuscitation with additional bolus PRN

## 2024-09-30 NOTE — Progress Notes (Signed)
 Daily Progress Note Intern Pager: [phone]  Patient name: Kristopher Thompson Medical record number: [id number] Date of birth: 11/09/1951 Age: 73 y.o. 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  Pertinent past medical history includes bladder cancer status post cystectomy with ileal conduit in 2018, h/o prostate cancer 2013 s/p prostatectomy, recent admission 05-05/2024 for incarcerated incisional, parastomal, and inguinal hernias with subsequent ileostomy, urostomy with ileal conduit revision, complicated by bowel perforation and septic shock requiring pressors and AKI requiring HD as well as urinary leak from conduit and b/l nephrostomy tube placement, 06/2024 admission for severe sepsis 2/2 abdominal wall infection, pyelonephritis and AKI with JP drain cultures +E coli and cornybacterium s/p removal 8/21, urine cultures +MRSA and E faecium, DM2, OSA, HTN, HFpEF CAD, RBBB, h/o PE on eliquis . Assessment & Plan Abdominal wall abscess Inability to tolerate diet - Urology consulted, appreciate recommendations  - Continue ongoing volume resuscitation with IV fluids - Hematuria will probably continue since patient is on heparin  gtt - Trend hemoglobin; bleeding from urinary source very mild and most of hemoglobin drop is attributable to hemodilution - General Surgery consult to follow-up for increase in abdominal gas foci - General  Surgery recommendations  - Operative abdominal wall I&D and exploration for 4pm Thursday 11/13  - Begin loperamide  and FiberCon to help calm diarrhea and bulk stool - Antibiotics - received Rocephin  IV x1 in ED --> transition to Zosyn  dosed per pharmacy - Blood cultures NGTD - Urine culture + for E. coli - Continue pain control with pain control: Tylenol  1000 mg Q6H PRN, Oxycodone  5-10 mg Q6H PRN - Nausea control with PRN IV compazine   - AM Labs: CBC, BMP - Fall precautions - Daily weight - Delirium precautions - PT/OT consult, appreciate recommendations - 1L NS daily for 6 weeks per GI - Schedule Lomotil  and continue FiberCon and GI iron  to constipate  - Match free water , appreciate RD recs (48hr calorie count) - 125 maintenance fluids  - Consider enteral feeding - Consult ID, recs appreciate  - PICC line? - GOC discussion, LTAC - D/c flexeril   Orthostatic hypotension Chronic, on home midodrine  10mg  TID. Low inpatient blood pressures despite home midodrine  and IVF resuscitation.   - Increase midodrine  from 10mg  TID to 15mg  TID - Continue IVF resuscitation with additional bolus PRN Hypercalcemia Corrected serum calcium  12.5 today, will begin workup for etiology of hypercalcemia with the following labs: - PTH  - PTH RP - TSH Ab, T3, thyroid  US  Protein-calorie malnutrition, severe Secondary to chronic illnesses. - RD consult, appreciate recs  - Daily multivitamin  - Daily Juven BID to support wound healing   - Magic cup TID with meals  - Regular diet as tolerated Delayed wound healing Midline abdominal wound s/p bowel perforation with open abdomen.  Well-appearing without erythema or drainage with clean bandage on top. - Wound care consult consulted, appreciate recs - Bandage change and wound care has signed off at this time. Chronic health problem  PE w/ Chronic anticoagulation : Hold Eliquis  5 mg tab BID.  Continue heparin  gtt.  Anxiety : Continue ATARAX  25 mg TID PRN,  Zoloft  50 mg Daily  Insomnia: Continue Melatonin 3 mg at bedtime HLD : Continue CRESTOR  10 mg at bedtime Hypotensive : Continue Midodrine  10 mg TID w/ meal  High output ileostomy (HCC) Imodium  4 g TID GERD : Starting IV PPI DM II : Monitor Daily BG w/ AM BMP  CKD stage 3b baseline GFR 30-44   OSA  CHF Hearing loss   FEN/GI: Heart healthy low sodium PPx: Heparin  gtt Dispo:Pending PT recommendations  pending clinical improvement .   Subjective:  VSS, nothing overnight.  Holding blood pressures with systolics in the 110s and diastolic in the 60s and 70s. Labs stable.  Sleeping comfortably, no complaints.  Objective: Temp:  [98.1 F (36.7 C)-99.2 F (37.3 C)] 98.1 F (36.7 C) (11/19 0720) Pulse Rate:  [72-89] 89 (11/19 0720) Resp:  [16-18] 16 (11/19 0720) BP: (110-122)/(65-81) 122/70 (11/19 0720) SpO2:  [97 %-98 %] 97 % (11/19 0720) Weight:  [78.3 kg] 78.3 kg (11/19 0500) Physical Exam: General: sleeping comfortably in no acute distress and easy to wake Cardiovascular: RRR, no m/r/g Respiratory: CTAB, no w/r/r, normal WOB on RA Abdomen: moderate TTP diffusely, soft, non-distended, wound vac in place, clean dry bandage over midline wound, greenish black output in ileostomy Extremities: mild 3rd spacing to upper extremities, no LE edema  Laboratory: Most recent CBC Lab Results  Component Value Date   WBC 7.4 09/30/2024   HGB 9.1 (L) 09/30/2024   HCT 29.3 (L) 09/30/2024   MCV 83.2 09/30/2024   PLT 232 09/30/2024   Most recent BMP    Latest Ref Rng & Units 09/30/2024    3:36 AM  BMP  Glucose 70 - 99 mg/dL 74   BUN 8 - 23 mg/dL 20   Creatinine 9.38 - 1.24 mg/dL 8.63   Sodium 864 - 854 mmol/L 140   Potassium 3.5 - 5.1 mmol/L 4.2   Chloride 98 - 111 mmol/L 114   CO2 22 - 32 mmol/L 15   Calcium  8.9 - 10.3 mg/dL 9.1    Lupie Credit, DO 09/30/2024, 7:34 AM  PGY-1, [facility] FPTS Intern pager: [phone], text pages welcome Secure chat group [facility]  [facility] Teaching Service

## 2024-09-30 NOTE — Assessment & Plan Note (Signed)
 PE w/ Chronic anticoagulation : Hold Eliquis  5 mg tab BID.  Continue heparin  gtt.  Anxiety : Continue ATARAX  25 mg TID PRN, Zoloft  50 mg Daily  Insomnia: Continue Melatonin 3 mg at bedtime HLD : Continue CRESTOR  10 mg at bedtime Hypotensive : Continue Midodrine  10 mg TID w/ meal  High output ileostomy (HCC) Imodium  4 g TID GERD : Starting IV PPI DM II : Monitor Daily BG w/ AM BMP  CKD stage 3b baseline GFR 30-44   OSA  CHF Hearing loss

## 2024-09-30 NOTE — Progress Notes (Signed)
 Occupational Therapy Treatment Patient Details Name: Kristopher Thompson MRN: [id number] DOB: 05/10/1951 Today's Date: 09/30/2024   History of present illness 73 y.o. male presenting to [facility] with complaint of nausea/vomiting, weakness, abdominal pain, and failure to thrive; concern for fistula near urostomy and slight increase in right hydronephrosis; 11/12 transferred to ICU due to low BP PMHx: Admit 5/30-7/11 for hernia repair with bowel perforation and DC to snf, readmit 7/14-7/16 with D/C to CIR 7/16-8/8. S/p excision of abdominal wall with cultures 09/24/24. PMH includes HTN, dCHF, CAD, DM2, PE on Eliquis , anemia, obesity, prostate and bladder CA, hernia repair, urostomy, colostomy, RBBB   OT comments  Pt greeted in supine, agreeable for OT visit. Pre-medicated by RN. Focus of today's session on ADL re-training and improving upright activity tolerance via ADLs and mobility. He was min A for supine<>sit via log roll and min A to stand to RW. Completed LB dressing via adaptive equipment trial with good carryover and pt receptive to equipment. Tolerated standing at the sink for ~4' for various ADLs, begins to stoop over as he fatigues. Rated 7/10 exertion via RPE, indicating reduced activity tolerance. OT to continue to follow.      If plan is discharge home, recommend the following:  A little help with walking and/or transfers;A little help with bathing/dressing/bathroom;Assist for transportation;Help with stairs or ramp for entrance   Equipment Recommendations  Other (comment) (defer)    Recommendations for Other Services      Precautions / Restrictions Precautions Precautions: Fall;Other (comment) (urosotmy, colostomy, neph tube) Recall of Precautions/Restrictions: Intact Precaution/Restrictions Comments: HoH; monitor HR Restrictions Weight Bearing Restrictions Per Provider Order: No       Mobility Bed Mobility Overal bed mobility: Needs Assistance Bed Mobility:  Supine to Sit, Sit to Supine     Supine to sit: Min assist, HOB elevated, Used rails Sit to supine: Mod assist, HOB elevated, Used rails   General bed mobility comments: slight HOB elevation, assist for BLE mgmt sit>supine, cued for modified log roll technique; incr time/effort to complete    Transfers Overall transfer level: Needs assistance Equipment used: Rolling walker (2 wheels) Transfers: Sit to/from Stand Sit to Stand: Min assist           General transfer comment: Stood from bed with cues for hand placement and sequencing to power up to RW.     Balance Overall balance assessment: Needs assistance Sitting-balance support: No upper extremity supported, Feet supported Sitting balance-Leahy Scale: Fair Sitting balance - Comments: seated EOB, occasionally leaning to the L side when engaged in seated ADLs (suspect d/t fatigue)   Standing balance support: Bilateral upper extremity supported, During functional activity, Reliant on assistive device for balance Standing balance-Leahy Scale: Poor Standing balance comment: reliant on RW for support and stability                           ADL either performed or assessed with clinical judgement   ADL Overall ADL's : Needs assistance/impaired     Grooming: Contact guard assist;Standing;Wash/dry hands Grooming Details (indicate cue type and reason): often stooping over with fatigue after grooming tasks             Lower Body Dressing: Contact guard assist;Sitting/lateral leans;With adaptive equipment Lower Body Dressing Details (indicate cue type and reason): used reacher to doff socks, sock aide to don hospital socks with good carryover; able to adjust socks via modified hip hike technique at EOB  Toileting- Clothing Manipulation and Hygiene: Total assistance Toileting - Clothing Manipulation Details (indicate cue type and reason): colostomy & urostomy intact     Functional mobility during ADLs: Contact  guard assist;Rolling walker (2 wheels);Cueing for safety      Extremity/Trunk Assessment              Vision       Perception     Praxis     Communication Communication Communication: Impaired Factors Affecting Communication: Hearing impaired (despite wearing hearing aides during OT session)   Cognition Arousal: Alert Behavior During Therapy: WFL for tasks assessed/performed, Flat affect Cognition: No apparent impairments             OT - Cognition Comments: flat affect, minimally conversational, but participatory                 Following commands: Intact        Cueing   Cueing Techniques: Verbal cues, Gestural cues  Exercises      Shoulder Instructions       General Comments HR incr to 130s with mobility but overall tolerated well    Pertinent Vitals/ Pain       Pain Assessment Pain Assessment: Faces Faces Pain Scale: Hurts little more Pain Location: R flank near wound vac site Pain Descriptors / Indicators: Grimacing, Guarding Pain Intervention(s): Limited activity within patient's tolerance, Monitored during session, Premedicated before session, Repositioned  Home Living                                          Prior Functioning/Environment              Frequency  Min 2X/week        Progress Toward Goals  OT Goals(current goals can now be found in the care plan section)  Progress towards OT goals: Progressing toward goals  Acute Rehab OT Goals Patient Stated Goal: get better  Plan      Co-evaluation                 AM-PAC OT 6 Clicks Daily Activity     Outcome Measure   Help from another person eating meals?: A Little Help from another person taking care of personal grooming?: A Little Help from another person toileting, which includes using toliet, bedpan, or urinal?: Total Help from another person bathing (including washing, rinsing, drying)?: A Lot Help from another person to put on and  taking off regular upper body clothing?: A Little Help from another person to put on and taking off regular lower body clothing?: A Lot 6 Click Score: 14    End of Session Equipment Utilized During Treatment: Rolling walker (2 wheels)  OT Visit Diagnosis: Unsteadiness on feet (R26.81);Other abnormalities of gait and mobility (R26.89);Muscle weakness (generalized) (M62.81);Pain Pain - Right/Left: Right Pain - part of body:  (flank/abdomen)   Activity Tolerance Patient tolerated treatment well   Patient Left in bed;with call bell/phone within reach;with family/visitor present   Nurse Communication Mobility status;Precautions        Time: 8683-8657 OT Time Calculation (min): 26 min  Charges: OT General Charges $OT Visit: 1 Visit OT Treatments $Self Care/Home Management : 23-37 mins  Chanika Byland M. [place], OTR/L [facility] Acute Rehabilitation Services [phone] Secure Chat Preferred  Chessie Neuharth 09/30/2024, 2:28 PM

## 2024-09-30 NOTE — Progress Notes (Signed)
   Inpatient Rehabilitation Admissions Coordinator   Noted ongoing medical issues. Would he be a possible LTACH candidate?  Heron Leavell, RN, MSN Rehab Admissions Coordinator [phone] 09/30/2024 10:38 AM

## 2024-09-30 NOTE — Consult Note (Signed)
 WOC Nurse wound follow up Wound type: surgical; right lateral abdomen  Measurement:2cm x 6cm x 2cm  Wound bed: clean, subcutaneous tissue Drainage (amount, consistency, odor) moderate; serosanguineous in the canister Periwound: intact  Dressing procedure/placement/frequency: Removed old NPWT dressing (1pc) Filled wound with  __1__ piece of black foam  Sealed NPWT dressing at HG Patient received IV pain medication per bedside nurse prior to dressing change Patient tolerated procedure well WOC nurse will continue to provide NPWT dressing changed due to the complexity of the dressing change.  Planning to change 11/21 and then to Tuesday/Friday beginning week of 11/24  WOC Nurse ostomy follow up Stoma type/location: RUQ, ileostomy  Stomal assessment/size: >1, round, budded, pale Peristomal assessment: intact Treatment options for stomal/peristomal skin: barrier ring Output liquid stool 250cc, requested NT to add to FS Ostomy pouching: 1pc. Soft convex with 2 barrier ring Education provided: NA; patient and his wife are independent in his ostomy care Enrolled patient in [place] Secure Start Discharge program: Yes, previously    Leanda Padmore [facility], CNS, MAINE [phone]

## 2024-09-30 NOTE — Progress Notes (Addendum)
 Still with high output ileostomy.  Suspect it is a reflection of malnutrition more than anything else.  Solid diet as tolerated.  Could consider nutrition with ileostomy instructions to help tailor diet Encourage oral hydration which has been a challenge.  Agree with dietary recommendations Continue soluble fiber twice daily to thicken effluent. Continue iron  twice daily to help constipate Increase DTO drops from 4 drops twice daily to 8 drops twice daily.  Lomotil  for breakthrough every 6 hours as needed greater than 300 mL  I would recommend normal saline IV fluid bolus 1 L daily x 6 weeks Consider PICC line placement to facilitate that at discharge  Calorie counts continuing.  I again feel this patient needs a G-tube to help to get full nutrition and hydration.  He still refuses.  Continue wound VAC to granulate right flank wound with exposed absorbable mesh.  Growing E. coli that seems to be mostly sensitive.  Continue IV ceftriaxone .  Transition to oral antibiotics at discharge.  I would do 6 weeks total.  Infectious disease has been involved before so you can consider reconsulting them if things do not improve, but there really was not any purulence.  No necrosis.  No major infection.  Wound seems to be cleaning up well.  I will see weekly while he is in house and follow closely as outpatient.

## 2024-09-30 NOTE — Progress Notes (Signed)
 Calorie Count Note  48 hour calorie count ordered. Day 1 complete. Wife reports pt had some nausea yesterday which impacted his PO intake after lunch. Unable to tolerate dinner or any CorePower shakes yesterday. Pt missed 1 meal yesterday but PO intake from other 2 meals met 49% of calorie and 37% of protein needs. Medications were switched to help with nausea which will hopefully help with consistent PO intake.   Diet: Regular Supplements: CorePower (brought in by wife; each contains 26 g protein, 170 kcal)  Estimated Nutritional Needs:  Kcal:  2000-2200 Protein:  100-120g   Day 1: 11/18 Breakfast: 100% cheerios x 2, whole milk x 2, orange juice x 2 (571 kcal, 19 g protein) Lunch: 100% PB & J sandwich, 2% milk (418 kcal, 18 g protein) Dinner: 0% Supplements: 0 Corepowers  Total intake: 989 kcal (49% of minimum estimated needs)  37 protein (37% of minimum estimated needs)   INTERVENTION:  Continue Juven BID   Continue daily MVI w/ minerals Added Florastor probiotic   May benefit from Oral Rehydration Solutions, recipes below per the Academy of Nutrition and Dietetics 2 cups Gatorade + 2 cups water  +  teaspoon salt  3 cups water  + 1 cup orange juice +  teaspoon salt +  teaspoon baking soda   cup grape juice or cranberry juice + 3 cups water  +  teaspoon salt   Encouraged adequate intake of meals and supplements to meet calorie and protein needs Wife bringing in CorePower shakes from home   Continue calorie count; Pt states he does not want to pursue PEG placement Is willing to try increasing CorePower intake  Would like to try to work on PO intake first; encouraged protein intake at every meal   NUTRITION DIAGNOSIS:  Severe Malnutrition related to chronic illness as evidenced by energy intake < or equal to 75% for > or equal to 1 month, severe muscle depletion, moderate fat depletion, percent weight loss.   Remains applicable   GOAL:  Patient will meet greater than  or equal to 90% of their needs   Progressing, still unmet     Josette Glance, MS, RDN, LDN Clinical Dietitian I Please reach out via secure chat

## 2024-09-30 NOTE — Plan of Care (Signed)
 FMTS Brief Progress Note  S: Bedside night round with Dr. Romelle.  Patient hemodynamically stable, lying in bed awake with no concerns.   O: BP 123/62 (BP Location: Right Arm)   Pulse 69   Temp 98.6 F (37 C)   Resp 17   Ht 5' 11 (1.803 m)   Wt 78.3 kg   SpO2 97%   BMI 24.08 kg/m    Physical Exam: General: NAD, pleasant, able to participate in exam Respiratory: No respiratory distress Skin: warm and dry, no rashes noted Psych: Normal affect and mood, at mental baseline  A/P: - Continue with plan as laid out in day progress note - Orders reviewed. Labs for AM ordered, which were adjusted as needed.  - If MAP < 65, plan additional bolus PRN with consideration of retransferring to ICU - Will continue to monitor closely  Elodie Palma, MD 09/30/2024, 8:34 PM PGY-1, [facility] Family Medicine Night Resident  Please page [phone] with questions.

## 2024-09-30 NOTE — Assessment & Plan Note (Signed)
 Corrected serum calcium  12.5 today, will begin workup for etiology of hypercalcemia with the following labs: - PTH  - PTH RP - TSH Ab, T3, thyroid  US 

## 2024-09-30 NOTE — Assessment & Plan Note (Signed)
 Secondary to chronic illnesses. - RD consult, appreciate recs  - Daily multivitamin  - Daily Juven BID to support wound healing   - Magic cup TID with meals  - Regular diet as tolerated

## 2024-09-30 NOTE — TOC Progression Note (Addendum)
 Transition of Care [facility]) - Progression Note    Patient Details  Name: Kristopher Thompson MRN: [id number] Date of Birth: [birth year]  Transition of Care [facility]) CM/SW Contact  Landry DELENA Senters, RN Phone Number: 09/30/2024, 12:01 PM  Clinical Narrative:    11/19- CM met with patient and wife to discuss d/c options when patient is ready. CIR is not an option at this time. CM provided choice of LTACH, and patient and wife prefer [facility] over [facility] at this time.  CM contacted Ciera, who will meet with patient and take wife on tour of [facility].   CM will continue to follow.    Expected Discharge Plan:  (TBD)                 Expected Discharge Plan and Services                                               Social Drivers of Health (SDOH) Interventions SDOH Screenings   Food Insecurity: No Food Insecurity (09/22/2024)  Housing: Low Risk  (09/22/2024)  Transportation Needs: No Transportation Needs (09/22/2024)  Utilities: At Risk (09/22/2024)  Depression (PHQ2-9): Medium Risk (09/16/2024)  Social Connections: Socially Integrated (09/22/2024)  Tobacco Use: Medium Risk (09/24/2024)    Readmission Risk Interventions    09/22/2024   12:33 PM 05/26/2024    8:59 AM 04/12/2024    4:58 PM  Readmission Risk Prevention Plan  Transportation Screening Complete Complete Complete  PCP or Specialist Appt within 5-7 Days  Complete   PCP or Specialist Appt within 3-5 Days   Complete  Home Care Screening  Complete   Medication Review (RN CM)  Complete   HRI or Home Care Consult   Complete  Social Work Consult for Recovery Care Planning/Counseling   Complete  Palliative Care Screening   Not Applicable  Medication Review Oceanographer) Referral to Pharmacy  Complete  PCP or Specialist appointment within 3-5 days of discharge Complete    HRI or Home Care Consult Complete    SW Recovery Care/Counseling Consult Complete    Palliative Care Screening Not Applicable    Skilled  Nursing Facility Not Applicable

## 2024-10-01 ENCOUNTER — Other Ambulatory Visit: Payer: Self-pay

## 2024-10-01 ENCOUNTER — Institutional Professional Consult (permissible substitution)
Admission: AD | Admit: 2024-10-01 | Discharge: 2024-10-14 | Disposition: A | Discharge status: Skilled Nursing Facility | Payer: Self-pay | Source: Other Acute Inpatient Hospital | Attending: Internal Medicine | Admitting: Internal Medicine

## 2024-10-01 DIAGNOSIS — L02217 Cutaneous abscess of flank: Secondary | ICD-10-CM

## 2024-10-01 DIAGNOSIS — L02211 Cutaneous abscess of abdominal wall: Secondary | ICD-10-CM | POA: Diagnosis not present

## 2024-10-01 DIAGNOSIS — B9620 Unspecified Escherichia coli [E. coli] as the cause of diseases classified elsewhere: Secondary | ICD-10-CM | POA: Diagnosis not present

## 2024-10-01 LAB — BASIC METABOLIC PANEL WITH GFR
Anion gap: 7 (ref 5–15)
BUN: 18 mg/dL (ref 8–23)
CO2: 18 mmol/L — ABNORMAL LOW (ref 22–32)
Calcium: 8.5 mg/dL — ABNORMAL LOW (ref 8.9–10.3)
Chloride: 112 mmol/L — ABNORMAL HIGH (ref 98–111)
Creatinine, Ser: 1.2 mg/dL (ref 0.61–1.24)
GFR, Estimated: >60 mL/min (ref >60)
Glucose, Bld: 79 mg/dL (ref 70–99)
Potassium: 3.7 mmol/L (ref 3.5–5.1)
Sodium: 137 mmol/L (ref 135–145)

## 2024-10-01 LAB — CBC
HCT: 26 % — ABNORMAL LOW (ref 39.0–52.0)
Hemoglobin: 8.1 g/dL — ABNORMAL LOW (ref 13.0–17.0)
MCH: 25.6 pg — ABNORMAL LOW (ref 26.0–34.0)
MCHC: 31.2 g/dL (ref 30.0–36.0)
MCV: 82 fL (ref 80.0–100.0)
Platelets: 219 K/uL (ref 150–400)
RBC: 3.17 MIL/uL — ABNORMAL LOW (ref 4.22–5.81)
RDW: 18.6 % — ABNORMAL HIGH (ref 11.5–15.5)
WBC: 7.3 K/uL (ref 4.0–10.5)
nRBC: 0 % (ref 0.0–0.2)

## 2024-10-01 LAB — GLUCOSE, CAPILLARY
Glucose-Capillary: 87 mg/dL (ref 70–99)
Glucose-Capillary: 140 mg/dL — ABNORMAL HIGH (ref 70–99)

## 2024-10-01 LAB — MAGNESIUM: Magnesium: 1.6 mg/dL — ABNORMAL LOW (ref 1.7–2.4)

## 2024-10-01 LAB — PHOSPHORUS: Phosphorus: 3.3 mg/dL (ref 2.5–4.6)

## 2024-10-01 MED ORDER — LOPERAMIDE HCL 2 MG PO CAPS
4.0000 mg | ORAL_CAPSULE | Freq: Three times a day (TID) | ORAL | Status: DC
  Administered 2024-10-01: 4 mg via ORAL
  Filled 2024-10-01: qty 2

## 2024-10-01 MED ORDER — MIDODRINE HCL 5 MG PO TABS: 15.0000 mg | ORAL_TABLET | Freq: Three times a day (TID) | ORAL | Status: DC

## 2024-10-01 MED ORDER — OXYCODONE HCL 5 MG PO TABS: 5.0000 mg | ORAL_TABLET | ORAL | Status: DC | PRN

## 2024-10-01 MED ORDER — AMOXICILLIN-POT CLAVULANATE 875-125 MG PO TABS: 1.0000 | ORAL_TABLET | Freq: Two times a day (BID) | ORAL | Status: AC

## 2024-10-01 MED ORDER — OPIUM 10 MG/ML (1%) PO TINC: 2.0000 [drp] | Freq: Four times a day (QID) | ORAL | Status: DC | PRN

## 2024-10-01 MED ORDER — AMOXICILLIN-POT CLAVULANATE 875-125 MG PO TABS: 1.0000 | ORAL_TABLET | Freq: Two times a day (BID) | ORAL | Status: DC

## 2024-10-01 MED ORDER — OPIUM 10 MG/ML (1%) PO TINC
8.0000 [drp] | Freq: Two times a day (BID) | ORAL | Status: DC
  Administered 2024-10-01: 4 mg via ORAL
  Filled 2024-10-01: qty 1

## 2024-10-01 MED ORDER — OPIUM 10 MG/ML (1%) PO TINC
2.0000 [drp] | Freq: Four times a day (QID) | ORAL | Status: DC | PRN
Start: 2024-10-01 — End: 2024-10-01

## 2024-10-01 MED ORDER — JUVEN PO PACK: 1.0000 | PACK | Freq: Two times a day (BID) | ORAL | Status: DC

## 2024-10-01 MED ORDER — ADULT MULTIVITAMIN W/MINERALS CH: 1.0000 | ORAL_TABLET | Freq: Every day | ORAL | Status: DC

## 2024-10-01 MED ORDER — SACCHAROMYCES BOULARDII 250 MG PO CAPS: 250.0000 mg | ORAL_CAPSULE | Freq: Two times a day (BID) | ORAL | Status: DC

## 2024-10-01 MED ORDER — POTASSIUM CHLORIDE CRYS ER 20 MEQ PO TBCR
20.0000 meq | EXTENDED_RELEASE_TABLET | Freq: Once | ORAL | Status: AC
  Administered 2024-10-01: 20 meq via ORAL
  Filled 2024-10-01: qty 1

## 2024-10-01 MED ORDER — MAGNESIUM SULFATE IN D5W 1-5 GM/100ML-% IV SOLN
1.0000 g | Freq: Once | INTRAVENOUS | Status: AC
  Administered 2024-10-01: 1 g via INTRAVENOUS
  Filled 2024-10-01: qty 100

## 2024-10-01 MED ORDER — SODIUM CHLORIDE 0.9% FLUSH: 10.0000 mL | INTRAVENOUS | Status: DC | PRN

## 2024-10-01 MED ORDER — SODIUM CHLORIDE 0.9 % IV SOLN: 125.0000 mL | INTRAVENOUS | Status: DC

## 2024-10-01 MED ORDER — HYDROMORPHONE HCL 1 MG/ML IJ SOLN: 0.2500 mg | INTRAMUSCULAR | Status: DC | PRN

## 2024-10-01 MED ORDER — SODIUM CHLORIDE 0.9 % IV BOLUS: 1000.0000 mL | Freq: Every day | INTRAVENOUS | Status: DC

## 2024-10-01 MED ORDER — FERROUS SULFATE 325 (65 FE) MG PO TABS: 325.0000 mg | ORAL_TABLET | Freq: Two times a day (BID) | ORAL | Status: DC

## 2024-10-01 MED ORDER — PROCHLORPERAZINE EDISYLATE 10 MG/2ML IJ SOLN: 10.0000 mg | Freq: Four times a day (QID) | INTRAMUSCULAR | Status: DC | PRN

## 2024-10-01 MED ORDER — OPIUM 10 MG/ML (1%) PO TINC: 8.0000 [drp] | Freq: Two times a day (BID) | ORAL | Status: DC

## 2024-10-01 NOTE — Progress Notes (Signed)
 Daily Progress Note Intern Pager: [phone]  Patient name: Kristopher Thompson Medical record number: [id number] Date of birth: October 07, 1951 Age: 73 y.o. 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  Pertinent past medical history includes bladder cancer status post cystectomy with ileal conduit in 2018, h/o prostate cancer 2013 s/p prostatectomy, recent admission 05-05/2024 for incarcerated incisional, parastomal, and inguinal hernias with subsequent ileostomy, urostomy with ileal conduit revision, complicated by bowel perforation and septic shock requiring pressors and AKI requiring HD as well as urinary leak from conduit and b/l nephrostomy tube placement, 06/2024 admission for severe sepsis 2/2 abdominal wall infection, pyelonephritis and AKI with JP drain cultures +E coli and cornybacterium s/p removal 8/21, urine cultures +MRSA and E faecium, DM2, OSA, HTN, HFpEF CAD, RBBB, h/o PE on eliquis .  Stable with family discussion to transfer to LTAC where he will continue IV fluid resuscitation and antibiotics per ID. Assessment & Plan Abdominal wall abscess Inability to tolerate diet - Urology consulted, appreciate recommendations  - Continue ongoing volume resuscitation with IV fluids - General Surgery recommendations  - Operative abdominal wall I&D and exploration for 11/13  -  loperamide , Lomotil , oral iron , and FiberCon to help calm diarrhea and bulk stool  - PICC line on discharge - Antibiotics - received Rocephin  IV x1 in ED --> transition to Zosyn  dosed per pharmacy - Awaiting ID recommendations for outpatient antibiotics - Urine culture + for E. coli - Continue pain control with pain control: Tylenol  1000 mg Q6H PRN, Oxycodone  5-10 mg Q6H PRN - Nausea control with PRN IV compazine   - AM Labs: CBC, BMP - Fall precautions - Daily weight - Delirium precautions - PT/OT consult, appreciate recommendations - 1L NS daily for 6 weeks per general surgery - Appreciate RD recs Orthostatic hypotension Stable blood pressures. - Continue midodrine  15mg  TID - Continue IVF resuscitation with additional bolus PRN Protein-calorie malnutrition, severe Secondary to chronic illnesses. - RD consult, appreciate recs  - Daily multivitamin  - Daily Juven BID to support wound healing   - Magic cup TID with meals  - Regular diet as tolerated Delayed wound healing Midline abdominal wound s/p bowel perforation with open abdomen.  Well-appearing without erythema or drainage with clean bandage on top. - Wound care consult consulted, appreciate recs - Bandage change and wound care has signed off at this time. Chronic health problem PE w/ Chronic anticoagulation : Hold Eliquis  5 mg tab BID.  Continue heparin  gtt.  Anxiety : Continue ATARAX  25 mg TID PRN, Zoloft  50 mg Daily  Insomnia: Continue Melatonin 3 mg at bedtime HLD : Continue CRESTOR  10 mg at bedtime Hypotensive : Continue Midodrine  10 mg TID w/ meal  High output ileostomy (HCC) Imodium  4 g TID GERD : Starting IV PPI DM II : Monitor Daily BG w/ AM BMP  CKD stage 3b baseline GFR 30-44   OSA  CHF Hearing loss   FEN/GI: Regular diet PPx:  Eliquis  BID Dispo:LTAC pending placement.   Subjective:  Patient was seen and examined at bedside.  Doing well, no complaints.  Objective: Temp:  [98 F (36.7 C)-99.5 F (37.5  C)] 98.1 F (36.7 C) (11/20 0327) Pulse Rate:  [69-89] 71 (11/20 0327) Resp:  [16-18] 18 (11/20 0327) BP: (105-123)/(57-70) 105/61 (11/20 0327) SpO2:  [95 %-98 %] 97 % (11/20 0327) Physical Exam: General: no acute distress, resting comfortably in hospital bed, son at bedside Cardiovascular: RRR, no m/r/g Respiratory: CTAB, no w/r/r, normal WOB on RA Abdomen: moderate TTP diffusely, soft, non-distended, wound vac in place, clean dry bandage over midline wound Extremities: mild 3rd spacing to upper extremities, no LE edema  Laboratory: Most recent CBC Lab Results  Component Value Date   WBC 7.3 10/01/2024   HGB 8.1 (L) 10/01/2024   HCT 26.0 (L) 10/01/2024   MCV 82.0 10/01/2024   PLT 219 10/01/2024   Most recent BMP    Latest Ref Rng & Units 10/01/2024    3:32 AM  BMP  Glucose 70 - 99 mg/dL 79   BUN 8 - 23 mg/dL 18   Creatinine 9.38 - 1.24 mg/dL 8.79   Sodium 864 - 854 mmol/L 137   Potassium 3.5 - 5.1 mmol/L 3.7   Chloride 98 - 111 mmol/L 112   CO2 22 - 32 mmol/L 18   Calcium  8.9 - 10.3 mg/dL 8.5    Lupie Credit, DO 10/01/2024, 7:20 AM  PGY-1, [facility] FPTS Intern pager: [phone], text pages welcome Secure chat group [facility] Teaching Service

## 2024-10-01 NOTE — Consult Note (Signed)
 [facility]    Date of Admission:  09/21/2024     Reason for Consult: abd wall infected fluid collection    Referring Provider: Rumball     Lines:  picc  Abx: 11/17-20 ceftriaxone  11/11-16 piptazo   Assessment: 73 yo male hx cystoprostatectomy for cancer, s/p ileal conduit collection, atrophic left kidney s/p nephrostomy, complicated recent early 2025 surgical course, now with osteomy, right kidney-ureteral stent, chronic right flank fluid collection, admitted for a week nausea, malaise, with ct showing slight increased gas in the fluid collection  He is s/p I&D 11/13 operative Cx grew e coli that is non-esbl 11/10 bcx negative    Surgical finding indicate no urine leak/fecal leak; some exposed mesh   There was a sinus tract that was excised  Chronic abd fluid collection/infection and we can treat for 4 weeks given some area exposed mesh   Plan: No need for picc from id standpoint Can transition to augmentin today Plan 4 weeks abx until 10/29/24 Id clinic f/u with me 12/11 Maintain standard isolation precaution Ok to discharge from my standoint if cleared by surgery Will sign off Discuss with primary team       ------------------------------------------------ Principal Problem:   Abdominal wall abscess Active Problems:   Hydronephrosis   E-coli UTI   High output ileostomy (HCC)   Inability to tolerate diet   Chronic health problem   CKD (chronic kidney disease)   Hypercalcemia   Open wound   Delayed wound healing   Hematuria   Protein-calorie malnutrition, severe   Orthostatic hypotension   Physical deconditioning    HPI: Kristopher Thompson is a 73 y.o. male admitted for 1 week nausea, poor appetite, malaise in setting chronically draining right flank abd collection  Patient has phasix mesh in his abd wall. Dr Sheldon has been caring for. The question previously posed has been if mesh was infected and at that  time it wasn't. He has stopped abx during previous admission before 08/14/2024  He followed up with me in clinic doing well outside of slightly thick/serous draining right flank wound  He was admitted 11/10 for sx of above No fever, leukocytosis Imaging ct as below  He was taken to or by dr Sheldon for I&D; I reviewed the operative note  I made an incision with scalpel around the punctate opening on the right lateral flank panniculus..  I excised some thick and fibrous fatty tissue.  Ensured hemostasis.  Some brisk subcutaneous venous bleeding given his large panniculus - controlled.  Cored out the chronic draining sinus tract  down to deeper subcutaneous tissues.  No abscess purulence or pus pocket really noted.  Did come to base and there was some fold of exposed Phasix mesh at the level of fascia.  There was some thickening just superior to it but dissection noted this was just the absorbable Phasix mesh folded upon itself.       I encountered no necrosis nor abscess.  No evidence of purulence upon the mesh. I explored gently along the abdominal wall and between the fascia and the mesh gently several centimeters circumferentially encountered no pockets of necrosis nor infection.  Mesh mostly well incorporated except for were it was now exposed in the deep subcutaneous/ fascial layer.  I held off on more aggressive dissection since fecal urostomy and urine ileal conduit urostomy not that far away. I swabbed the mesh for culture and sent tissue for pathology  No evidence of any urine nor feculent drainage.  Hemostasis is good.  Dried up well.  No persistent drainage.  I packed the wound with 6 inch rolled Kerlix gauze soaked in a little bit of chlorhexidine  but mostly saline.  Dry ABD pad applied.  Patient tolerated procedure well.  Not on any pressors.  Hemodynamically stable.  Being sent to recovery room.    Doesn't appear there was any fistula from gut, ileal conduit, or ureter  No gross pus  found  An area exposed mesh at base of the fluid collection  Patient said he has had less nausea but otherwise felt about the same   Urology also evaluated but nothing to intervene on given surgical finding   Family History  Problem Relation Age of Onset   Lung cancer Mother    Hypertension Father    Colon cancer Other    CAD Neg Hx    Diabetes Neg Hx    Stroke Neg Hx     Social History   Tobacco Use   Smoking status: Former    Current packs/day: 0.00    Types: Cigarettes    Start date: 12/24/1970    Quit date: 12/25/1979    Years since quitting: 44.8   Smokeless tobacco: Never  Vaping Use   Vaping status: Never Used  Substance Use Topics   Alcohol  use: Yes    Comment: rare   Drug use: No    Allergies  Allergen Reactions   Demerol  [Meperidine ] Nausea And Vomiting    Review of Systems: ROS All Other ROS was negative, except mentioned above   Past Medical History:  Diagnosis Date   At risk for sleep apnea    12-25-2017   STOP-BANG SCORE= 5   --- SENT TO PCP   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   moderate atypia - right low back   Atypical nevus 04/04/2007   moderate to marked - right upper back (wider shave)   Atypical nevus 04/04/2007   moderate atypia - center chest (wider shave)   Atypical nevus 04/04/2007   slight atypia - right thigh   Atypical nevus 11/29/2011   mild atypia - center upper back   Atypical nevus 11/29/2011   mild atypia - center chest   Bacteremia due to Klebsiella pneumoniae 10/09/2017   Bladder cancer (HCC) dx 07/2017   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  s/p  cystectomy w/ ileal conduit urinary diversion   Candida infection    CHF (congestive heart failure) (HCC)    Colostomy in place [facility])    since 08-08-2017-- per pt 12-25-2017 reddness around stoma   Diabetes mellitus without complication (HCC)    pt reports he was pre-DM/borderline, not taking any medications   GERD (gastroesophageal reflux disease)    H/O hiatal hernia     History of sepsis 09/2017   dx bacteremia due to klebsiella pneumoniae,  post op intraabdominal abscess   Prostate cancer [facility]) urologist-- dr renda   10-02-2012 s/p  prostatectomy-- Stage T1c   RBBB    Renal disorder    pt. denies   Sleep apnea    cpap   Squamous cell carcinoma of skin 05/22/2013   left cheek - CX3 + 5FU   Wears glasses        Scheduled Meds:  amoxicillin-clavulanate  1 tablet Oral Q12H   apixaban   5 mg Oral BID   Chlorhexidine  Gluconate Cloth  6 each Topical Daily   ferrous sulfate   325 mg Oral BID WC  loperamide   4 mg Oral TID   melatonin  3 mg Oral QHS   midodrine   15 mg Oral TID WC   multivitamin with minerals  1 tablet Oral Daily   nutrition supplement (JUVEN)  1 packet Oral BID BM   polycarbophil  625 mg Oral BID   rosuvastatin   10 mg Oral QPM   saccharomyces boulardii  250 mg Oral BID   sertraline   50 mg Oral Daily   cyanocobalamin   500 mcg Oral Q M,W,F   Continuous Infusions:  sodium chloride  125 mL/hr at 10/01/24 0638   sodium chloride  1,000 mL (10/01/24 1037)   PRN Meds:.acetaminophen , HYDROmorphone  (DILAUDID ) injection, hydrOXYzine , ipratropium, mouth rinse, oxyCODONE , prochlorperazine , sodium chloride    OBJECTIVE: Blood pressure 108/67, pulse 91, temperature 98.6 F (37 C), resp. rate 16, height 5' 11 (1.803 m), weight 78.3 kg, SpO2 99%.  Physical Exam  General/constitutional: no distress, pleasant HEENT: Normocephalic, PER, Conj Clear, EOMI, Oropharynx clear Neck supple CV: rrr no mrg Lungs: clear to auscultation, normal respiratory effort Abd: Soft, Nontender; urostomy and osteomy present (functional); right flank drain with sediments and dark green/yellow fluid) Ext: no edema Skin: No Rash Neuro: nonfocal MSK: no peripheral joint swelling/tenderness/warmth   Central line presence: lue picc site no erythema/tenderness   Lab Results Lab Results  Component Value Date   WBC 7.3 10/01/2024   HGB 8.1 (L) 10/01/2024    HCT 26.0 (L) 10/01/2024   MCV 82.0 10/01/2024   PLT 219 10/01/2024    Lab Results  Component Value Date   CREATININE 1.20 10/01/2024   BUN 18 10/01/2024   NA 137 10/01/2024   K 3.7 10/01/2024   CL 112 (H) 10/01/2024   CO2 18 (L) 10/01/2024    Lab Results  Component Value Date   ALT 14 09/30/2024   AST 15 09/30/2024   ALKPHOS 60 09/30/2024   BILITOT <0.2 09/30/2024      Microbiology: Recent Results (from the past 240 hours)  C Difficile Quick Screen w PCR reflex     Status: None   Collection Time: 09/21/24  7:47 PM   Specimen: STOOL  Result Value Ref Range Status   C Diff antigen NEGATIVE NEGATIVE Final   C Diff toxin NEGATIVE NEGATIVE Final   C Diff interpretation No C. difficile detected.  Final    Comment: Performed at [facility], [address] [place], [place] [address]  Gastrointestinal Panel by PCR , Stool     Status: None   Collection Time: 09/21/24  7:47 PM   Specimen: STOOL  Result Value Ref Range Status   Campylobacter species NOT DETECTED NOT DETECTED Final   Plesimonas shigelloides NOT DETECTED NOT DETECTED Final   Salmonella species NOT DETECTED NOT DETECTED Final   Yersinia enterocolitica NOT DETECTED NOT DETECTED Final   Vibrio species NOT DETECTED NOT DETECTED Final   Vibrio cholerae NOT DETECTED NOT DETECTED Final   Enteroaggregative E coli (EAEC) NOT DETECTED NOT DETECTED Final   Enteropathogenic E coli (EPEC) NOT DETECTED NOT DETECTED Final   Enterotoxigenic E coli (ETEC) NOT DETECTED NOT DETECTED Final   Shiga like toxin producing E coli (STEC) NOT DETECTED NOT DETECTED Final   Shigella/Enteroinvasive E coli (EIEC) NOT DETECTED NOT DETECTED Final   Cryptosporidium NOT DETECTED NOT DETECTED Final   Cyclospora cayetanensis NOT DETECTED NOT DETECTED Final   Entamoeba histolytica NOT DETECTED NOT DETECTED Final   Giardia lamblia NOT DETECTED NOT DETECTED Final   Adenovirus F40/41 NOT DETECTED NOT DETECTED Final  Astrovirus NOT DETECTED NOT  DETECTED Final   Norovirus GI/GII NOT DETECTED NOT DETECTED Final   Rotavirus A NOT DETECTED NOT DETECTED Final   Sapovirus (I, II, IV, and V) NOT DETECTED NOT DETECTED Final    Comment: Performed at [facility], [address] [place], [place] [address]  Blood Culture (routine x 2)     Status: None   Collection Time: 09/21/24 11:14 PM   Specimen: BLOOD RIGHT HAND  Result Value Ref Range Status   Specimen Description BLOOD RIGHT HAND  Final   Special Requests   Final    BOTTLES DRAWN AEROBIC AND ANAEROBIC Blood Culture adequate volume   Culture   Final    NO GROWTH 5 DAYS Performed at [facility], [address], [place], [place] [address]    Report Status 09/26/2024 FINAL  Final  Surgical pcr screen     Status: None   Collection Time: 09/24/24  7:36 AM   Specimen: Nasal Mucosa; Nasal Swab  Result Value Ref Range Status   MRSA, PCR NEGATIVE NEGATIVE Final   Staphylococcus aureus NEGATIVE NEGATIVE Final    Comment: (NOTE) The Xpert SA Assay (FDA approved for NASAL specimens in patients 39 years of age and older), is one component of a comprehensive surveillance program. It is not intended to diagnose infection nor to guide or monitor treatment. Performed at [facility], [address], [place], [place] [address]   Aerobic/Anaerobic Culture w Gram Stain (surgical/deep wound)     Status: None   Collection Time: 09/24/24  5:26 PM   Specimen: Path Tissue  Result Value Ref Range Status   Specimen Description WOUND ABDOMEN  Final   Special Requests SWAB OF ABDOMINAL WALL MESH PHASIX PT ON ZOSYN   Final   Gram Stain   Final    RARE WBC PRESENT, PREDOMINANTLY PMN NO ORGANISMS SEEN    Culture   Final    RARE ESCHERICHIA COLI NO ANAEROBES ISOLATED Performed at [facility], [address], [place], [place] [address]    Report Status 09/29/2024 FINAL  Final   Organism ID, Bacteria ESCHERICHIA COLI  Final      Susceptibility   Escherichia coli - MIC*     AMPICILLIN 16 INTERMEDIATE Intermediate     CEFAZOLIN  (NON-URINE) 4 INTERMEDIATE Intermediate     CEFEPIME  <=0.12 SENSITIVE Sensitive     ERTAPENEM <=0.12 SENSITIVE Sensitive     CEFTRIAXONE  <=0.25 SENSITIVE Sensitive     CIPROFLOXACIN  <=0.06 SENSITIVE Sensitive     GENTAMICIN  <=1 SENSITIVE Sensitive     MEROPENEM <=0.25 SENSITIVE Sensitive     TRIMETH /SULFA  <=20 SENSITIVE Sensitive     AMPICILLIN/SULBACTAM 8 SENSITIVE Sensitive     PIP/TAZO Value in next row Sensitive      <=4 SENSITIVEThis is a modified FDA-approved test that has been validated and its performance characteristics determined by the reporting laboratory.  This laboratory is certified under the Clinical Laboratory Improvement Amendments CLIA as qualified to perform high complexity clinical laboratory testing.    * RARE ESCHERICHIA COLI     Serology:    Imaging: If present, new imagings (plain films, ct scans, and mri) have been personally visualized and interpreted; radiology reports have been reviewed. Decision making incorporated into the Impression / Recommendations.  09/21/24 ct chest abd pelv 1. No CT evidence for acute intrathoracic abnormality. No acute airspace disease. 2. Status post cystoprostatectomy with right abdominal ileal conduit. Right ureteral stent in place with stable to  minimally increased mild to moderate right hydronephrosis, post removal of right percutaneous nephrostomy. Slight increased stranding at the right renal pelvis and ureter compared to prior, with diffuse urothelial thickening of left renal pelvis and ureter, correlate for upper UTI. Left-sided percutaneous nephrostomy catheter remains in place and there is no left hydronephrosis. 3. Redemonstrated heterogenous collection within the right abdominal wall near the urostomy. Increased gas within the fluid collection compared to the most recent prior with additional finding of small gas within the ileal conduit and within the  distal right ureter adjacent to the stent. The heterogenous abdominal wall collection also appears to communicate with the right lateral flank skin surface as may be seen with a fistula. Increased gas within the collection compared to the prior exam could be secondary to superimposed infection, other consideration could include fistula or leak related to the urostomy. 4. Other chronic findings as previously described 5. Aortic atherosclerosis.  Constance ONEIDA Passer, MD [facility] [phone] pager    10/01/2024, 1:01 PM

## 2024-10-01 NOTE — Assessment & Plan Note (Addendum)
 Stable blood pressures. - Continue midodrine  15mg  TID - Continue IVF resuscitation with additional bolus PRN

## 2024-10-01 NOTE — Progress Notes (Signed)
 Peripherally Inserted Central Catheter Placement  The IV Nurse has discussed with the patient and/or persons authorized to consent for the patient, the purpose of this procedure and the potential benefits and risks involved with this procedure.  The benefits include less needle sticks, lab draws from the catheter, and the patient may be discharged home with the catheter. Risks include, but not limited to, infection, bleeding, blood clot (thrombus formation), and puncture of an artery; nerve damage and irregular heartbeat and possibility to perform a PICC exchange if needed/ordered by physician.  Alternatives to this procedure were also discussed.  Bard Power PICC patient education guide, fact sheet on infection prevention and patient information card has been provided to patient /or left at bedside.    PICC Placement Documentation  PICC Single Lumen 10/01/24 Right Basilic 40 cm 0 cm (Active)  Indication for Insertion or Continuance of Line Home intravenous therapies (PICC only) 10/01/24 1459  Exposed Catheter (cm) 0 cm 10/01/24 1459  Line Status Flushed;Saline locked;Blood return noted 10/01/24 1459  Dressing Type Transparent;Securing device 10/01/24 1459  Dressing Status Antimicrobial disc/dressing in place;Clean, Dry, Intact 10/01/24 1459  Line Care Connections checked and tightened 10/01/24 1459  Line Adjustment (NICU/IV Team Only) No 10/01/24 1459  Dressing Change Due 10/08/24 10/01/24 1459       Bonni Rock Larve 10/01/2024, 3:08 PM

## 2024-10-01 NOTE — Discharge Summary (Signed)
 Family Medicine Teaching [facility] Discharge Summary  Patient name: Kristopher Thompson Medical record number: [id number] Date of birth: Aug 24, 1951 Age: 73 y.o. 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 year old with extensive past medical history as outlined below who presented with nausea and vomiting and was admitted to the [facility] Medicine Teaching Service for recurrent abdominal abscess.  Abdominal wall abscess Kristopher Thompson presented to the ED with nausea, vomiting, weakness, abdominal pain, and failure to thrive.  He was found to have electrolyte derangements of hyperkalemia and hypercalcemia.  In the ED, he was given IV fluids, and IV Rocephin , and admitted for further workup.  Urology and general surgery specialties were consulted.  The patient was placed on Zosyn  during hospital stay. Hospital course complicated by hypotension despite fluid resuscitation and pharmacological intervention with need for ICU transfer 11/12. He went to the OR to have excision of abdominal wall tract with cultures by surgery 11/13.  Wound cultures were positive for E. coli.  ID was consulted and recommended transitioning IV ABX to Augmentin  for 4 weeks with end date of 10/29/2024.   Orthostatic hypotension Chronic, on home midodrine  10mg  TID.  Patient continued to exhibit low blood pressures despite home midodrine  and IVF resuscitation.  Home midodrine  was increased from 10 mg 3 times daily to 15 mg 3 times daily and patient was given additional LR boluses as needed.  He was transferred to the ICU for persistent hypotension on 11/12 for IV Levophed . He was weaned of Levophed   and transferred back to the floor on 11/17. He continually had large volume output from his ileostomy requiring daily 1 L NS boluses and maintenance IVF to maintain pressures. A PICC line was placed prior to transfer to [facility] facility. Loperamide  4 mg TID was started prior to discharge to help bulk stool and reduce volume loss.   Hypercalcemia Corrected serum calcium  14.0 on admission.  The patient was given 2 doses of subcutaneous calcitonin with improvement.  Workup for etiology included: PTH, PTH RP, vitamin D , TSH, T3/T4, thyroid  ultrasound, a.m. plasma cortisol, multiple myeloma panel which all resulted within normal limits.   Protein-calorie malnutrition, severe Secondary to chronic illnesses.  Extra calories added per dietitian including daily Juven twice daily, and Magic cup 3 times daily.  The patient was encouraged to eat a regular diet as tolerated.  Delayed wound healing Midline abdominal wound s/p bowel perforation with open abdomen.  Wound care consulted and this was without complication throughout hospital course. He was discharged to [facility] with wound vac.    PCP Follow-up Recommendations: 6 months out from PE, discuss whether or not patient needs to stay on Eliquis  or needs to come down to 2.5mg  BID Orthostatic hypotension follow-up Electrolyte derangement Wound follow-ups Surgical follow-up   Discharge Diagnoses/Problem List:  [facility] Problems      Hospital     * (Principal) Abdominal wall abscess     Hydronephrosis     E-coli UTI     High output ileostomy (HCC)     Inability to tolerate diet     Chronic health problem     CKD (chronic kidney disease)     Hypercalcemia     Open wound  Delayed wound healing     Hematuria     Protein-calorie malnutrition, severe     Orthostatic hypotension     Physical deconditioning   Disposition: LTAC  Discharge Condition: Stable  Discharge Exam:  General: no acute distress, resting comfortably in hospital bed, son at  bedside Cardiovascular: RRR, no m/r/g Respiratory: CTAB, no w/r/r, normal WOB on RA Abdomen: moderate TTP diffusely, soft, non-distended, wound vac in place, clean dry bandage over midline wound Extremities: mild 3rd spacing to upper extremities, no LE edema  Significant Procedures:  Abdominal washout 11/13  Significant Labs and Imaging:  Recent Labs  Lab 09/30/24 0336 10/01/24 0332  WBC 7.4 7.3  HGB 9.1* 8.1*  HCT 29.3* 26.0*  PLT 232 219   Recent Labs  Lab 09/30/24 0336 10/01/24 0332  NA 140 137  K 4.2 3.7  CL 114* 112*  CO2 15* 18*  GLUCOSE 74 79  BUN 20 18  CREATININE 1.36* 1.20  CALCIUM  9.1 8.5*  MG 1.7 1.6*  PHOS 3.2 3.3  ALKPHOS 60  --   AST 15  --   ALT 14  --   ALBUMIN  2.2*  --    CT chest A/P 09/21/2024: 1. No CT evidence for acute intrathoracic abnormality. No acute airspace disease. 2. Status post cystoprostatectomy with right abdominal ileal conduit. Right ureteral stent in place with stable to minimally increased mild to moderate right hydronephrosis, post removal of right percutaneous nephrostomy. Slight increased stranding at the right renal pelvis and ureter compared to prior, with diffuse urothelial thickening of left renal pelvis and ureter, correlate for upper UTI. Left-sided percutaneous nephrostomy catheter remains in place and there is no left hydronephrosis. 3. Redemonstrated heterogenous collection within the right abdominal wall near the urostomy. Increased gas within the fluid collection compared to the most recent prior with additional finding of small gas within the ileal conduit and within the distal right ureter adjacent to the stent. The heterogenous abdominal wall collection also appears to communicate with the right lateral flank skin surface as may be seen with a fistula. Increased gas within the collection compared to the prior exam could be secondary to superimposed infection, other consideration could include fistula  or leak related to the urostomy. 4. Other chronic findings as previously described 5. Aortic atherosclerosis.  Thyroid  ultrasound 09/24/2024: Mildly enlarged thyroid  gland with normal echotexture and no discrete nodules.  Discharge Medications:  Allergies as of 10/01/2024       Reactions   Demerol  [meperidine ] Nausea And Vomiting        Medication List     STOP taking these medications    childrens multivitamin chewable tablet   Nutritional Supplement Liqd Replaced by: nutrition supplement (JUVEN) Pack   ondansetron  4 MG disintegrating tablet Commonly known as: ZOFRAN -ODT       TAKE these medications    acetaminophen  325 MG tablet Commonly known as: TYLENOL  Take 1-2 tablets (325-650 mg total) by mouth every 4 (four) hours as needed for mild pain (pain score 1-3).   amoxicillin-clavulanate 875-125 MG tablet Commonly known as: AUGMENTIN Take 1 tablet by mouth every 12 (twelve) hours for 28 days.   cyanocobalamin  1000 MCG tablet Commonly known as: VITAMIN B12 Take 0.5 tablets (500 mcg total) by mouth every Monday, Wednesday, and Friday.   Eliquis  5 MG Tabs tablet Generic drug: apixaban  Take 1 tablet (5 mg total) by mouth 2 (two) times daily.   ferrous sulfate  325 (65 FE) MG tablet Take 1 tablet (325 mg total)  by mouth 2 (two) times daily with a meal.   FT Fiber Laxative 625 MG tablet Generic drug: polycarbophil Take 1 tablet (625 mg total) by mouth 2 (two) times daily.   HYDROmorphone  1 MG/ML injection Commonly known as: DILAUDID  Inject 0.25 mLs (0.25 mg total) into the vein every 4 (four) hours as needed (breakthrough pain or wound care).   hydrOXYzine  25 MG tablet Commonly known as: ATARAX  Take 1 tablet (25 mg total) by mouth 3 (three) times daily as needed for anxiety.   ipratropium 0.03 % nasal spray Commonly known as: ATROVENT  Place 1 spray into both nostrils every 8 (eight) hours as needed (allergies).   loperamide  2 MG capsule Commonly known  as: IMODIUM  Take 2 capsules (4 mg total) by mouth 3 (three) times daily.   melatonin 3 MG Tabs tablet Take 1 tablet (3 mg total) by mouth at bedtime.   midodrine  5 MG tablet Commonly known as: PROAMATINE  Take 3 tablets (15 mg total) by mouth 3 (three) times daily with meals. What changed: how much to take   multivitamin with minerals Tabs tablet Take 1 tablet by mouth daily. Start taking on: October 02, 2024   nutrition supplement (JUVEN) Pack Take 1 packet by mouth 2 (two) times daily between meals. Replaces: Nutritional Supplement Liqd   oxyCODONE  5 MG immediate release tablet Commonly known as: Oxy IR/ROXICODONE  Take 1-2 tablets (5-10 mg total) by mouth every 4 (four) hours as needed for moderate pain (pain score 4-6), severe pain (pain score 7-10) or breakthrough pain (5mg  for moderate pain, 10mg  for severe pain).   prochlorperazine  10 MG/2ML injection Commonly known as: COMPAZINE  Inject 2 mLs (10 mg total) into the vein every 6 (six) hours as needed.   rosuvastatin  10 MG tablet Commonly known as: CRESTOR  Take 1 tablet (10 mg total) by mouth every evening.   saccharomyces boulardii 250 MG capsule Commonly known as: FLORASTOR Take 1 capsule (250 mg total) by mouth 2 (two) times daily.   sertraline  50 MG tablet Commonly known as: Zoloft  Take 1 tablet (50 mg total) by mouth daily for 30 days, THEN 2 tablets (100 mg total) daily. Start taking on: September 16, 2024   sodium chloride  0.65 % Soln nasal spray Commonly known as: OCEAN Place 1-2 sprays into both nostrils every 6 (six) hours as needed for congestion (dry nose).   sodium chloride  0.9 % infusion Inject 125 mLs into the vein continuous.   sodium chloride  0.9 % Inject 1,000 mLs into the vein daily. Start taking on: October 02, 2024        Discharge Instructions: Please refer to Patient Instructions section of EMR for full details.  Patient was counseled important signs and symptoms that should prompt  return to medical care, changes in medications, dietary instructions, activity restrictions, and follow up appointments.   Follow-Up Appointments:  Follow-up Information     [name], MD. Schedule an appointment as soon as possible for a visit in 1 month(s).   Specialties: General Surgery, Colon and Rectal Surgery Why: To follow up after your hospital stay Contact information: [address] [place] [address] [phone]         [name], MD. Schedule an appointment as soon as possible for a visit in 2 week(s).   Specialty: Urology Why: To follow up after your hospital stay Contact information: [address] [place] [address] [phone]  Cleotilde Perkins, DO 10/01/2024, 2:33 PM PGY-3, [facility] Family Medicine

## 2024-10-01 NOTE — Assessment & Plan Note (Addendum)
-   Urology consulted, appreciate recommendations  - Continue ongoing volume resuscitation with IV fluids - General Surgery recommendations  - Operative abdominal wall I&D and exploration for 11/13  - loperamide , Lomotil , oral iron , and FiberCon to help calm diarrhea and bulk stool  - PICC line on discharge - Antibiotics - received Rocephin  IV x1 in ED --> transition to Zosyn  dosed per pharmacy - Awaiting ID recommendations for outpatient antibiotics - Urine culture + for E. coli - Continue pain control with pain control: Tylenol  1000 mg Q6H PRN, Oxycodone  5-10 mg Q6H PRN - Nausea control with PRN IV compazine   - AM Labs: CBC, BMP - Fall precautions - Daily weight - Delirium precautions - PT/OT consult, appreciate recommendations - 1L NS daily for 6 weeks per general surgery - Appreciate RD recs

## 2024-10-01 NOTE — Assessment & Plan Note (Signed)
 Midline abdominal wound s/p bowel perforation with open abdomen.  Well-appearing without erythema or drainage with clean bandage on top. - Wound care consult consulted, appreciate recs - Bandage change and wound care has signed off at this time.

## 2024-10-01 NOTE — Progress Notes (Addendum)
 In checking the chart on this patient, it looks like the DTO opium  drops were stopped today.  That is not what I recommended.   Please see my note from 11/19.   Patient had poorly controlled high output ileostomy diarrhea on loperamide  and Lomotil .  That is why I switched to DTO drops and that seems to be working.  I restarted DTO drops 8 drops twice daily.  2 drops every 6 hours as needed breakthrough diarrhea. Continue soluble fiber to thicken and iron  twice daily to constipate.

## 2024-10-01 NOTE — TOC Transition Note (Signed)
 Transition of Care [facility]) - Discharge Note   Patient Details  Name: Kristopher Thompson MRN: [id number] Date of Birth: [birth year]  Transition of [facility]) CM/SW Contact:  Andrez JULIANNA George, RN Phone Number: 10/01/2024, 3:13 PM   Clinical Narrative:     [facility] Bed has been confirmed. Pt will transport via bed to the [facility] unit.  Room number is 5E03, accepting MD is Dr. Frutoso  Wife and patient are aware and in agreement. Bedside RN updated.   Final next level of care: Long Term Acute Care (LTAC) Barriers to Discharge: No Barriers Identified   Patient Goals and CMS Choice   CMS Medicare.gov Compare Post Acute Care list provided to:: Patient Choice offered to / list presented to : Patient, Spouse      Discharge Placement                       Discharge Plan and Services Additional resources added to the After Visit Summary for                                       Social Drivers of Health (SDOH) Interventions SDOH Screenings   Food Insecurity: No Food Insecurity (09/22/2024)  Housing: Low Risk  (09/22/2024)  Transportation Needs: No Transportation Needs (09/22/2024)  Utilities: At Risk (09/22/2024)  Depression (PHQ2-9): Medium Risk (09/16/2024)  Social Connections: Socially Integrated (09/22/2024)  Tobacco Use: Medium Risk (09/24/2024)     Readmission Risk Interventions    09/22/2024   12:33 PM 05/26/2024    8:59 AM 04/12/2024    4:58 PM  Readmission Risk Prevention Plan  Transportation Screening Complete Complete Complete  PCP or Specialist Appt within 5-7 Days  Complete   PCP or Specialist Appt within 3-5 Days   Complete  Home Care Screening  Complete   Medication Review (RN CM)  Complete   HRI or Home Care Consult   Complete  Social Work Consult for Recovery Care Planning/Counseling   Complete  Palliative Care Screening   Not Applicable  Medication Review [facility]) Referral to Pharmacy  Complete  PCP or  Specialist appointment within 3-5 days of discharge Complete    HRI or Home Care Consult Complete    SW Recovery Care/Counseling Consult Complete    Palliative Care Screening Not Applicable    Skilled Nursing Facility Not Applicable

## 2024-10-01 NOTE — Assessment & Plan Note (Signed)
 PE w/ Chronic anticoagulation : Hold Eliquis  5 mg tab BID.  Continue heparin  gtt.  Anxiety : Continue ATARAX  25 mg TID PRN, Zoloft  50 mg Daily  Insomnia: Continue Melatonin 3 mg at bedtime HLD : Continue CRESTOR  10 mg at bedtime Hypotensive : Continue Midodrine  10 mg TID w/ meal  High output ileostomy (HCC) Imodium  4 g TID GERD : Starting IV PPI DM II : Monitor Daily BG w/ AM BMP  CKD stage 3b baseline GFR 30-44   OSA  CHF Hearing loss

## 2024-10-01 NOTE — Assessment & Plan Note (Signed)
 Secondary to chronic illnesses. - RD consult, appreciate recs  - Daily multivitamin  - Daily Juven BID to support wound healing   - Magic cup TID with meals  - Regular diet as tolerated

## 2024-10-01 NOTE — Assessment & Plan Note (Deleted)
 Corrected serum calcium  12.5 today, will begin workup for etiology of hypercalcemia with the following labs: - PTH  - PTH RP - TSH Ab, T3, thyroid  US 

## 2024-10-01 NOTE — Discharge Instructions (Signed)
 Dear Kristopher Thompson,  Thank you for letting us  participate in your care. You were hospitalized for inability to tolerate oral food and drink and diagnosed with Abdominal wall abscess. You were treated with an abdominal washout.   POST-HOSPITAL & CARE INSTRUCTIONS Follow-up with your PCP within 1 week Go to your follow up appointments (listed below)  DOCTOR'S APPOINTMENT   Future Appointments  Date Time Provider Department Center  10/22/2024 11:15 AM [place] Constance DASEN, MD [facility]  10/26/2024  1:00 PM [facility]-IR 1 [facility]  10/26/2024  2:00 PM MC-IR 1 MC-IR [facility]  11/16/2024  1:00 PM Emeline Joesph BROCKS, DO CPR-PRMA CPR    Follow-up Information     [name], MD. Schedule an appointment as soon as possible for a visit in 1 month(s).   Specialties: General Surgery, Colon and Rectal Surgery Why: To follow up after your hospital stay Contact information: [address] [place] [address] [phone]         [name], MD. Schedule an appointment as soon as possible for a visit in 2 week(s).   Specialty: Urology Why: To follow up after your hospital stay Contact information: [address] [place] [address] [phone]                 Take care and be well!  Family Medicine Teaching Service Inpatient Team [place]  [facility]  [address] [place], [place] [address] [phone]

## 2024-10-03 LAB — PTH-RELATED PEPTIDE: PTH-related peptide: <2 pmol/L

## 2024-10-05 LAB — CBC
HCT: 35.6 % — ABNORMAL LOW (ref 39.0–52.0)
Hemoglobin: 11.3 g/dL — ABNORMAL LOW (ref 13.0–17.0)
MCH: 26.5 pg (ref 26.0–34.0)
MCHC: 31.7 g/dL (ref 30.0–36.0)
MCV: 83.6 fL (ref 80.0–100.0)
Platelets: 309 K/uL (ref 150–400)
RBC: 4.26 MIL/uL (ref 4.22–5.81)
RDW: 19.4 % — ABNORMAL HIGH (ref 11.5–15.5)
WBC: 9.8 K/uL (ref 4.0–10.5)
nRBC: 0 % (ref 0.0–0.2)

## 2024-10-05 LAB — COMPREHENSIVE METABOLIC PANEL WITH GFR
ALT: 34 U/L (ref 0–44)
AST: 44 U/L — ABNORMAL HIGH (ref 15–41)
Albumin: 2.7 g/dL — ABNORMAL LOW (ref 3.5–5.0)
Alkaline Phosphatase: 80 U/L (ref 38–126)
Anion gap: 9 (ref 5–15)
BUN: 11 mg/dL (ref 8–23)
CO2: 19 mmol/L — ABNORMAL LOW (ref 22–32)
Calcium: 9.4 mg/dL (ref 8.9–10.3)
Chloride: 108 mmol/L (ref 98–111)
Creatinine, Ser: 1.49 mg/dL — ABNORMAL HIGH (ref 0.61–1.24)
GFR, Estimated: 50 mL/min — ABNORMAL LOW (ref >60)
Glucose, Bld: 150 mg/dL — ABNORMAL HIGH (ref 70–99)
Potassium: 3.8 mmol/L (ref 3.5–5.1)
Sodium: 136 mmol/L (ref 135–145)
Total Bilirubin: 0.4 mg/dL (ref 0.0–1.2)
Total Protein: 6.2 g/dL — ABNORMAL LOW (ref 6.5–8.1)

## 2024-10-05 LAB — MAGNESIUM: Magnesium: 1.5 mg/dL — ABNORMAL LOW (ref 1.7–2.4)

## 2024-10-07 LAB — CBC
HCT: 29.6 % — ABNORMAL LOW (ref 39.0–52.0)
Hemoglobin: 9.3 g/dL — ABNORMAL LOW (ref 13.0–17.0)
MCH: 26.1 pg (ref 26.0–34.0)
MCHC: 31.4 g/dL (ref 30.0–36.0)
MCV: 82.9 fL (ref 80.0–100.0)
Platelets: 232 K/uL (ref 150–400)
RBC: 3.57 MIL/uL — ABNORMAL LOW (ref 4.22–5.81)
RDW: 19.2 % — ABNORMAL HIGH (ref 11.5–15.5)
WBC: 8.5 K/uL (ref 4.0–10.5)
nRBC: 0 % (ref 0.0–0.2)

## 2024-10-07 LAB — BASIC METABOLIC PANEL WITH GFR
Anion gap: 6 (ref 5–15)
BUN: 9 mg/dL (ref 8–23)
CO2: 19 mmol/L — ABNORMAL LOW (ref 22–32)
Calcium: 8.8 mg/dL — ABNORMAL LOW (ref 8.9–10.3)
Chloride: 113 mmol/L — ABNORMAL HIGH (ref 98–111)
Creatinine, Ser: 1.36 mg/dL — ABNORMAL HIGH (ref 0.61–1.24)
GFR, Estimated: 55 mL/min — ABNORMAL LOW (ref >60)
Glucose, Bld: 109 mg/dL — ABNORMAL HIGH (ref 70–99)
Potassium: 4.2 mmol/L (ref 3.5–5.1)
Sodium: 138 mmol/L (ref 135–145)

## 2024-10-07 LAB — MAGNESIUM: Magnesium: 1.7 mg/dL (ref 1.7–2.4)

## 2024-10-07 LAB — PHOSPHORUS: Phosphorus: 3.5 mg/dL (ref 2.5–4.6)

## 2024-10-11 LAB — COMPREHENSIVE METABOLIC PANEL WITH GFR
ALT: 30 U/L (ref 0–44)
AST: 26 U/L (ref 15–41)
Albumin: 2.3 g/dL — ABNORMAL LOW (ref 3.5–5.0)
Alkaline Phosphatase: 73 U/L (ref 38–126)
Anion gap: 7 (ref 5–15)
BUN: 13 mg/dL (ref 8–23)
CO2: 15 mmol/L — ABNORMAL LOW (ref 22–32)
Calcium: 8.8 mg/dL — ABNORMAL LOW (ref 8.9–10.3)
Chloride: 115 mmol/L — ABNORMAL HIGH (ref 98–111)
Creatinine, Ser: 1.6 mg/dL — ABNORMAL HIGH (ref 0.61–1.24)
GFR, Estimated: 45 mL/min — ABNORMAL LOW (ref >60)
Glucose, Bld: 108 mg/dL — ABNORMAL HIGH (ref 70–99)
Potassium: 4.1 mmol/L (ref 3.5–5.1)
Sodium: 137 mmol/L (ref 135–145)
Total Bilirubin: 0.3 mg/dL (ref 0.0–1.2)
Total Protein: 5.4 g/dL — ABNORMAL LOW (ref 6.5–8.1)

## 2024-10-11 LAB — CBC WITH DIFFERENTIAL/PLATELET
Abs Immature Granulocytes: 0.02 K/uL (ref 0.00–0.07)
Basophils Absolute: 0.1 K/uL (ref 0.0–0.1)
Basophils Relative: 1 %
Eosinophils Absolute: 0.9 K/uL — ABNORMAL HIGH (ref 0.0–0.5)
Eosinophils Relative: 12 %
HCT: 30.6 % — ABNORMAL LOW (ref 39.0–52.0)
Hemoglobin: 9.7 g/dL — ABNORMAL LOW (ref 13.0–17.0)
Immature Granulocytes: 0 %
Lymphocytes Relative: 23 %
Lymphs Abs: 1.8 K/uL (ref 0.7–4.0)
MCH: 26.4 pg (ref 26.0–34.0)
MCHC: 31.7 g/dL (ref 30.0–36.0)
MCV: 83.4 fL (ref 80.0–100.0)
Monocytes Absolute: 1 K/uL (ref 0.1–1.0)
Monocytes Relative: 13 %
Neutro Abs: 4 K/uL (ref 1.7–7.7)
Neutrophils Relative %: 51 %
Platelets: 213 K/uL (ref 150–400)
RBC: 3.67 MIL/uL — ABNORMAL LOW (ref 4.22–5.81)
RDW: 19 % — ABNORMAL HIGH (ref 11.5–15.5)
WBC: 7.7 K/uL (ref 4.0–10.5)
nRBC: 0 % (ref 0.0–0.2)

## 2024-10-11 LAB — PHOSPHORUS: Phosphorus: 3.7 mg/dL (ref 2.5–4.6)

## 2024-10-11 LAB — MAGNESIUM: Magnesium: 1.5 mg/dL — ABNORMAL LOW (ref 1.7–2.4)

## 2024-10-12 LAB — MAGNESIUM: Magnesium: 1.7 mg/dL (ref 1.7–2.4)

## 2024-10-12 LAB — BASIC METABOLIC PANEL WITH GFR
Anion gap: 6 (ref 5–15)
BUN: 13 mg/dL (ref 8–23)
CO2: 15 mmol/L — ABNORMAL LOW (ref 22–32)
Calcium: 8.8 mg/dL — ABNORMAL LOW (ref 8.9–10.3)
Chloride: 115 mmol/L — ABNORMAL HIGH (ref 98–111)
Creatinine, Ser: 1.46 mg/dL — ABNORMAL HIGH (ref 0.61–1.24)
GFR, Estimated: 51 mL/min — ABNORMAL LOW (ref >60)
Glucose, Bld: 102 mg/dL — ABNORMAL HIGH (ref 70–99)
Potassium: 3.8 mmol/L (ref 3.5–5.1)
Sodium: 136 mmol/L (ref 135–145)

## 2024-10-12 NOTE — Progress Notes (Signed)
 PATIENT INFORMATION   Patient Name: Kristopher Thompson  Date of Birth: 12-20-1950  MRN: [id number]   Date of Admission: 10/01/2024  4:06 PM Length of Stay: 11 Length of Stay: 11   Primary Care Physician: No primary care provider on file.  Attending Physician:OFORI, HARGIS GRADE, APRN    SUBJECTIVE   Chief Complaint:  Abdominal infection.    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  Patient was admitted at [facility] from 09/21/2024 to 10/01/2024.  Patient with known recurrent abdominal abscess, bladder cancer s/p cystectomy presented to the hospital with nausea and vomiting.  He was found to have electrolyte imbalance consisted of hypokalemia and hypocalcemia.  In the ED, he was given IV fluid and empiric antibiotics.  Urology and General surgery were consulted. He subsequently developed hypotension despite fluid resuscitation and was transferred to ICU 11/12.  He went to OR for Excision of right lateral flank abdominal drain sinus tract with culture by surgery 11/13.  Culture was positive for E coli.  Patient received IV antibiotics then transitioned to p.o. Augmentin .  Patient was weaned off Levophed  and started on midodrine .  He continued to have large volume output from his ileostomy requiring almost daily p.o. T  saline boluses to maintain pressures.  Electrolyte imbalance was corrected.  Patient was given 2 doses of subcutaneous calcitonin.  Workup for  etiology including PTH, PTHrP, vitamin-D, TSH, T3, T4, thyroid  ultrasound, a.m. plasma cortisol, multiple myeloma panel which all resulted within normal limits. On 10/01/2024[facility]     Subjective: Patient seen and examined; he denies any pain nor distress at this time, however appear to have an increased work of breathing. He says its because he is cold. He has several blankets on him and the room is fairly warm.  He is afebrile and does not appear to be shivering.  He is concerned with his reported weight loss.  Documented weight as of 11/30 was 173 lb then weight again on 11/30 at 169 lb, followed by a new weight today 12/1 at 169 lb.  Numbers initial weight was 176 lb on 11/20.  He reports he does not feel like he has much of an appetite, did not eat his breakfast this morning, his wife who is at bedside indicating she ate the food on the plate and the patient only ate the bowl of cereal.  Patient reports the scheduled doses of Zofran  prior to meals have been ineffective, denies any nausea, vomiting.  Will consider adding an appetite stimulant to daily med routine.  OBJECTIVE   Objective  Vital Signs:  BP 110/56   Pulse 98   Temp 97.2 F (36.2 C)   Resp 18   Ht 5' 11 (1.803 m)   Wt 169 lb 8 oz (76.9 kg)   SpO2 98%   BMI 23.64 kg/m    I/O last 24 Hours:  Intake/Output Summary (Last 24 hours) at 10/12/2024 0827 Last data filed at 10/12/2024 0400 Gross per 24 hour  Intake 1950 ml  Output 1675  ml  Net 275 ml     Physical Exam General:  Ill appearing male seen and examined in no distress. HEENT:  Normocephalic atraumatic, PERRLA, EOMs intact, nares  patent, oropharynx is clear  Neck : Supple, no JVD.   Cardiovascular : S1-S2 WNL, no murmurs rubs or gallops Lungs: clear to auscultation  Abdomen: + BS X4 Quads, soft, Tender, ND, peg intact, Ileostomy & Urostomy-RUQ,  Left Nephrostomy. Right flank dressing Clean intact.    Extremities: no clubbing cyanosis or edema  Integumentary: Warm & Dry Neuro:  awake, alert and oriented X4.    LABS:  Most recent labs reviewed 10/12/2024 BMP: Na 136, K+ 3.8, Cl 115, CO2 15, BUN 13, Cr 1.46  Mg 1.7,     IMAGING & OTHER STUDIES: I have reviewed all lmaging and testing from the last 24 hrs    ASSESSMENT AND PLAN   Kristopher Thompson is a 73 y.o. male  who was admitted on 10/01/2024 with Infectious disease of abdomen [A50.08] .    Assessment/Plan: Abdominal wall abscess Patient presented to OSH with nausea, vomiting, weakness, abdominal pain. He was found to have electrolyte derangements of hyperkalemia and hypercalcemia.   He went to the OR to have excision of right lateral flank abdominal drain sinus tract with cultures by surgery on 11/13.  Wound culture was positive for E. coli.  Was started on IV antibiotics.  Before discharge from OSH, ID  recommended transitioning IV ABX to Augmentin  for 4 weeks with end date of 10/29/2024.    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  Required pressors -Levophed  at outside hospital.  Now discharged on midodrine  15 mg TID.  Continues to have high output ileostomy.  We will monitor volume status closely and give fluids accordingly.  Continues with normal D5 half saline 75 cc/h. Continue Imodium  scheduled. Changed Opium  tincture to Octreotide.   Electrolyte imbalance.  Workup for etiology of hypercalcemia included: PTH, PTH RP, vitamin D , TSH, T3/T4, thyroid  ultrasound, a.m. plasma cortisol, multiple myeloma panel which all resulted within normal limits.  We will monitor electrolytes closely.  Replace/treat as needed per protocol.   Delayed wound healing Midline abdominal wound s/p bowel perforation with open abdomen.  Wound care consulted .SABRA    Generalized weakness/deconditioning/failure to thrive.  Patient has been chronically ill.  Patient is actively  participating with PT and OT.  Continue supportive care.   Protein-calorie malnutrition, severe Secondary to chronic illnesses. The patient was encouraged to eat a regular diet as tolerated.  Megestrol  20 mg, 1 tablet daily added to medication regimen for complaints of decreased appetite.  The registered dietitian continues to follow.   Refractory Nausea: changed zofran  from PRN to scheduled q6h for 3 days followed by q6h PRN. C/O nausea with meals scheduled Zofran  IV before meals, changed to Po before meals.   Hyperglycemia: On D51/2NS. Monitor blood glucose and Na.   Prophylaxis.  Patient on Eliquis  for history of PE, DVT prophylaxis  and Omeprazole for GI prophylaxis.     Code Status:  Full Resuscitation    Spent a total of 50 minutes on this encounter. This includes reviewing patient's extensive history, assessment and visit with the patient as well as documentation time. Time spent also includes IDT collaboration.    SIGNATURE   Electronically signed:  EDWENA HARGIS GRADE, APRN  10/12/2024 at 8:27 AM EST

## 2024-10-12 NOTE — Unmapped External Note (Signed)
 PT Treatment  Patient Name: Kristopher Thompson Patient Birthdate: June 21, 1951  Patient Subjective Report - Patient agreeable to PT.    Pain Assessment Pain Context: Therapy Assessment Prior to Treatment (10/12/24 1115) Pain Assessment: NRS 0-10 (10/12/24 1115) Pain Score: 0 - No pain (10/12/24 1115) Pain Severity - NRS (Calculated): No pain (10/12/24 1115)      Cognition:    Overall Cognitive Status:  Within Functional Limits   Alert:  Yes Safety Awareness:    Safety Awareness:  Fair  Mobility:  Bed Mobility:    Roll Left and Right:  Supervision   Lying to Sitting on Edge of Bed:  Supervision Sit to Stand:    Sit to Stand Level of Assistance:  Minimal assistance   Device:  RW Stand to Sit:    Stand to Sit Level of Assistance:  Minimal assistance   Device:  RW  Balance/Activity:  Static Sitting:    Static Sitting Level of Assistance:  Close Supervision Dynamic Sitting:    Dynamic Sitting Balance:  Reaching for objects   Dynamic Sitting Level of Assistance:  Close Supervision   Surface:  Smooth surface Assistive Device:  RW Ambulation Level of Assistance:  Contact Guard Gait Analysis:  15 feet x2   Transfer to 1:  Sit Transfer from 1:  Bed Technique 1:  To right Transfer Level of Assistance 1:  Close Supervision                            Summary:  PT Summary:    PT Summary Comments:  Patient agreeable to PT. Supine to sit with CGA to S of 1. Patient sitting EOB x 10 mins with SBA of 1. Sit to stand with RW with CGA to min assist of 1 with RW. Patient ambulated 15 x 2 with RW in room with CGA of 1 with slow steady gait. Patient c/o increased dizziness with gait. Patient returned to sitting in w/c with call bell within reach. Patients wife present throughout.         Pain Evaluation and Follow-up Response to Therapy Interventions: Improved activity tolerance, Improved mobility (10/12/24 1115) Pain Reassessment: 0, No pain (10/12/24 1115)    Therapy Minutes      PTA Time In : 1115 PTA Time Out: 1140 PTA Total Time with Patient (Min): 25 min PTA Gait Training: 10 PTA Therapeutic Activities: 15 PTA Total Time to be Billed (Min): 25        BOUPLON, BRAD A., PTA 10/12/2024

## 2024-10-13 NOTE — Unmapped External Note (Signed)
 PT Treatment  Patient Name: Darrick Greenlaw Patient Birthdate: 08-09-1951  Patient Subjective Report - Patient agreeable to PT.   Pain Assessment Pain Context: Therapy Assessment Prior to Treatment (10/13/24 1115) Pain Assessment: NRS 0-10 (10/13/24 1115) Pain Score: 0 - No pain (10/13/24 1115) Pain Severity - NRS (Calculated): No pain (10/13/24 1115)      Cognition:    Overall Cognitive Status:  Within Functional Limits Safety Awareness:    Safety Awareness:  Fair  Mobility:  Bed Mobility:    Lying to Sitting on Edge of Bed:  Touching/contact guard Sit to Stand:    Sit to Stand Level of Assistance:  Touching/contact guard   Device:  RW Stand to Sit:    Stand to Sit Level of Assistance:  Touching/contact guard   Device:  RW     Surface:  Smooth surface Assistive Device:  RW Ambulation Level of Assistance:  Minimal Assistance Gait Analysis:  15 feet x 2 with RW with SBA of 1.   Transfer to 1:  Sit Transfer from 1:  Bed Technique 1:  To right Transfer Level of Assistance 1:  Minimal Assistance Technique 2:  Sit to stand Transfer Level of Assistance 2:  Minimal Assistance                            Summary:  PT Summary:    PT Summary Comments:  Patient agreeable to PT. Supine to sit with SBA of 1. Sitting EOB x 10 mins with SBA to CGA of 1. Sit to stand with RW with min to CGA of 1. Patient ambulated 15 feet x 2 with sitting rest break in between walks. Patient c/o feeling a little dizzy. Patient returning to chair with wife present in room. PT Summary Plan of Care:    PT Summary Plan of Care:  Continue with current plan of care         Pain Evaluation and Follow-up Response to Therapy Interventions: Improved activity tolerance, Improved mobility (10/13/24 1115) Pain Reassessment: 0, No pain (10/13/24 1115)   Therapy Minutes      PTA Time In : 1115 PTA Time Out: 1145 PTA Total Time with Patient (Min): 30 min PTA Therapeutic Activities:  30 PTA Total Time to be Billed (Min): 30        BOUPLON, BRAD A., PTA 10/13/2024

## 2024-10-14 NOTE — Discharge Summary (Signed)
 [name]   BRIEF OVERVIEW   Admitting Provider: Corean JONETTA Daring, MD Discharge Provider: Corean JONETTA Daring, MD Primary Care Physician at Discharge: Kristopher Thompson [phone]   Admission Date: 10/01/2024     Discharge Date: 10/14/2024.  Chief complaint on admission  Abdominal infection.   Primary Discharge Diagnosis Physical Therapy  Rehabilitation  Abdominal infection    DISCHARGE DISPOSITION   Code Status at Discharge: FULL CODE  Discharge Diet: Adult Diet Regular; 7 Regular (Regular Texture); 0 Thin (All Liquids)   Outpatient Follow-Up No future appointments.  Referrals and Follow-ups to Schedule     Creatinine, body fluid     This is an external result entered through the Results Console.   Creatinine, body fluid     This is an external result entered through the Results Console.   Creatinine, body fluid     This is an external result entered through the Results Console.       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  Patient was admitted at [facility] from 09/21/2024 to 10/01/2024.  Patient with known recurrent abdominal abscess, bladder cancer s/p cystectomy presented to the hospital with nausea and vomiting.  He was found to have electrolyte imbalance consisted of hypokalemia and hypocalcemia.  In the ED, he was given IV fluid and IV Rocephin  initially, then changed by Zosyn .  Urology  and General surgery were consulted. He developed hypotension despite fluid resuscitation was transferred to ICU 11/12.  He went to OR for Excision of right lateral flank abdominal drain sinus tract with culture by surgery 11/13.  Wound culture was positive for E coli.  Patient received IV antibiotics then transitioned to p.o. Augmentin .  Patient was weaned off Levophed  and started on midodrine .  He continued to have large volume output from his ileostomy requiring daily almost saline boluses to maintain pressures.  Electrolyte imbalance was corrected.  Patient was given 2 doses of subcutaneous calcitonin.  Workup for etiology including PTH, PTHrP, vitamin-D, TSH, T3, T4, thyroid  ultrasound, a.m. plasma cortisol, multiple myeloma panel which all resulted within normal limits. On 10/01/2024, [facility] treatment.  Assessment/Plan   Hospital Course:  Infectious disease of abdomen/Abdominal wall abscess Patient presented to OSH with nausea, vomiting, weakness, abdominal pain, and failure to thrive.  He was found to have electrolyte derangements of hyperkalemia and hypercalcemia.   Hospital course complicated by hypotension despite fluid resuscitation and pharmacological intervention with need for ICU transfer 11/12. He went to the OR to have excision of right lateral flank abdominal drain sinus tract with cultures by surgery 11/13.  Wound cultures were positive for E. coli.  Was started on IV antibiotics.  Before discharge from OSH, ID  recommended transitioning IV ABX to Augmentin  for 4 weeks with end date of 10/29/2024.   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  Required pressors -Levophed  at outside hospital.  Now discharged on midodrine  15 mg TID.[facility]  Continues to have  high output ileostomy.  We will monitor volume status closely and give fluids accordingly.  Continue Imodium  scheduled.      Electrolyte imbalance.   Workup for etiology of hypercalcemia included: PTH, PTH RP, vitamin D , TSH, T3/T4, thyroid  ultrasound, a.m. plasma cortisol, multiple myeloma panel which all resulted within normal limits.  We will monitor electrolytes closely.  Replace/treat as needed.   Delayed wound healing Midline abdominal wound s/p bowel perforation with open abdomen.  Wound care consulted .[facility]    Generalized weakness/deconditioning/failure to thrive.  Patient has been chronically ill.  We will obtain PT and OT evaluation and treatment.  Continue supportive care.   Protein-calorie malnutrition, severe Secondary to chronic illnesses.   The patient was encouraged to eat a regular diet as tolerated.  We will obtain dietitian consult.   Prophylaxis.  Patient on Eliquis  for history of PE.  Omeprazole for GI prophylaxis.  Consults: none    Medication List     START taking these medications      Prescription Last Dose and Time given  acetaminophen  325 MG tablet Commonly known as: TYLENOL   Take 2 tablets (650 mg total) by mouth every 6 (six) hours as needed for Temp > or equal to 101F (38.3C) or mild pain. Refill: 0    Acidophilus Lactobacillus capsule  1 each by PO/Per Tube route in the morning and 1 each before bedtime. Refill: 0  1 each on October 14, 2024  9:45 AM   amoxicillin -clavulanate 875-125 MG tablet Commonly known as: AUGMENTIN   1 tablet (875 mg total) by PO/Per Tube route in the morning and 1 tablet (875 mg total) before bedtime. Do all this for 31 doses. Indications: Infection Within the Abdomen. Refill: 0  875 mg on October 14, 2024  9:45 AM   apixaban  5 MG tablet Commonly known as: Eliquis   1 tablet (5 mg total) by PO/Per Tube route in the morning and 1 tablet (5 mg total) before bedtime. Indications: History of PE. Refill: 0  5 mg on October 14, 2024  9:46 AM   atorvastatin 20 MG tablet Commonly known as: LIPITOR  1 tablet (20 mg total) by PO/Per Tube route in the  morning. Refill: 0 Start Taking: October 15, 2024  20 mg on October 14, 2024  9:44 AM   ferrous sulfate  325 mg (65 mg elemental iron ) 65 mg of iron  EC tablet  Take 1 tablet by mouth 2 (two) times a day with Breakfast and Dinner. Refill: 0  1 tablet on October 14, 2024  8:46 AM   hydrOXYzine  25 MG tablet Commonly known as: ATARAX   1 tablet (25 mg total) by PO/Per Tube route every 8 (eight) hours as needed for anxiety or itching. Refill: 0    loperamide  2 MG capsule Commonly known as: IMODIUM   Take 2 capsules (4 mg total) by mouth 3 (three) times a day. Refill: 0  4 mg on October 14, 2024  6:37 AM   megestrol  20 MG tablet Commonly known as: MEGACE   1 tablet (20 mg total) by PO/Per Tube route in the morning. Refill: 0 Start Taking: October 15, 2024  20 mg on October 14, 2024  9:45 AM   melatonin tablet  1 tablet (3 mg total) by PO/Per Tube route nightly. Refill: 0  3 mg on October 13, 2024  9:38 PM   midodrine  5 MG tablet Commonly known as: PROAMATINE   3 tablets (15 mg total) by PO/Per Tube route in the morning and 3 tablets (15 mg  total) at noon and 3 tablets (15 mg total) in the evening. Refill: 0  15 mg on October 14, 2024 11:36 AM   multivitamin tablet  1 tablet by PO/Per Tube route in the morning. Refill: 0 Start Taking: October 15, 2024  1 tablet on October 14, 2024  9:45 AM   octreotide 100 MCG/ML injection Commonly known as: SandoSTATIN  Inject 0.5 mL (50 mcg total) under the skin in the morning and 0.5 mL (50 mcg total) before bedtime. Refill: 0  50 mcg on October 14, 2024  9:48 AM   Omeprazole ODT 20 MG disintegrating tablet Commonly known as: Prilosec OTC  Take 2 tablets (40 mg total) by mouth in the morning. Refill: 0 Start Taking: October 15, 2024  40 mg on October 14, 2024  9:45 AM   ondansetron  ODT 4 MG disintegrating tablet Commonly known as: ZOFRAN -ODT  Take 1 tablet (4 mg total) by mouth in the morning and 1 tablet (4 mg total) at noon and 1  tablet (4 mg total) in the evening. Take before meals. Do all this for 3 doses. Refill: 0  4 mg on October 14, 2024 11:36 AM   sertraline  50 MG tablet Commonly known as: ZOLOFT   1 tablet (50 mg total) by PO/Per Tube route in the morning. Refill: 0 Start Taking: October 15, 2024  50 mg on October 14, 2024  9:45 AM   vitamin B-12 500 MCG tablet Commonly known as: CYANOCOBALAMIN   Take 1 tablet (500 mcg total) by mouth 3 (three) times a week. Refill: 0 Start Taking: October 16, 2024  500 mcg on October 14, 2024  9:45 AM        Physical Exam at Discharge General:  male seen and examined in no distress. HEENT:  Normocephalic atraumatic, PERRLA, EOMs intact, nares  patent, oropharynx is clear  Neck : Supple, no JVD.   Cardiovascular : S1-S2 WNL, no murmurs rubs or gallops Lungs: clear to auscultation  Abdomen: + BS X4 Quads, soft, Tender, ND, peg intact, Ileostomy & Urostomy-RUQ,  Left Nephrostomy. Right flank dressing Clean intact.   Extremities: no clubbing cyanosis or edema  Integumentary: Warm & Dry, multiple drains, lines and wounds to abdomen. PIV to LUA  Neuro:  awake, alert and oriented X4.   Discharge Condition: good Pulse: 76 Resp: 20 BP: 106/55 Temp: 97 F (36.1 C) Weight: 170 lb 6.7 oz (77.3 kg)  >31 minutes spent in discharge coordination.    CELESTINO EDWENA HARGIS CINDERELLA, APRN 10/14/24 12:33 PM EST

## 2024-10-14 NOTE — Unmapped External Note (Signed)
 Wound Progress Note  Reason for wound Consult: Wound Discharge Assessment  Patient is awake, alert, and oriented  Kristopher Thompson is a 73 y.o. male with the following Problems.  Problem List[1]  Past Medical History:  No past medical history on file.  Past Surgical History:  No past surgical history on file.  Allergies:  Meperidine   Braden Score:  Braden Scale Score: 16  Wound/Ulcer Assessment:  Wound Surgical Wound Quadrant, abdomen Lower;Midline/Middle/Center (Active)  Date First Assessed/Time First Assessed: 10/01/24 1713   Wound Type - REQUIRED: Surgical Wound  Pre-Existing Wound: Yes  Anatomical Site: Quadrant, abdomen  Wound Location Orientation: Lower;Midline/Middle/Center    Assessments 10/14/2024  1:07 PM  Wound Image    Wound Length (cm) 10.1 cm  Wound Width (cm) 4 cm  Wound Depth (cm) 0.1  Calculated Wound Size (cm^2) 40.4 cm^2  Calculated Wound Size (cm^3) 4.04 cm^3  Change in Wound Size %  -1  Extent of Tissue Loss Full thickness tissue loss  Undermining None present  Granulation Tissue Bright, beefy red. 75 to 100 % of wound filled and/or tissue overgrowth  Epithelialization 0 to <25% wound covered  Necrotic Tissue Type None visible  Necrotic Tissue Amount None visible  Wound Edges Distinct, outline clearly visible, attached and even with wound base  Periwound Skin Color Pink or normal for ethnic group  Periwound Skin Edema No swelling or edema  Periwound Skin Induration None present  Exudate Type Serosanguineous: thin watery, pale re/pink  Exudate Amount Scant, wound moist but no observable exudate  Odor None  Wound Management Cleansed;Barrier Film;Alginate;Foam;Gauze  Dressing Changed Changed  Dressing Status Clean;Dry;Intact     Active Orders  Date Order Order Summary Order Comments Authorizing Provider  10/02/24 [phone] Wound Management: Other - enter in comments; Wound care per provider; Monitor Fistula bag and ostomy bag to right addomen; Dressing  damp, moist or saturated, Dressing no longer intact (i.e. lifting, leaking, damaged) Routine, Every 4 hours, First occurrence on Fri 10/02/24 at 0939, Until Specified Wound Surgical Wound Quadrant, abdomen Lower;Midline/Middle/Center  Wound Surgical Wound Quadrant, abdomen Right;Lateral  Wound Location: Other - enter in comments Wound Management: Wound care per provider Treatment Order: Monitor Fistula bag and ostomy bag to right addomen Change/PRN: Dressing damp, moist or saturated, Dressing no longer intact (i.e. lifting, leaking, damaged) - Rogue LOISE Kiang, NP  10/02/24 0938 Wound Management: Use Associated Wounds location (mid abdomen); Wound care per specified algorithm/order; Kathrine; Normal Saline; Prep with skin prep and allow to dry; Alginate silver; Adhesive foam; Dressing no longer intact (i.e. lifting, leaking, d... Routine, Every Mon, Wed, Fri, First occurrence on Fri 10/02/24 at 0939, Until Specified Wound Surgical Wound Quadrant, abdomen Lower;Midline/Middle/Center  Wound Location: Use Associated Wounds location / mid abdomen Wound Management: Wound care per specified algorithm/order Type: Wet Cleanse: Normal Saline Prep: Prep with skin prep and allow to dry Fill/Apply: Alginate silver Cover: Adhesive foam Change/PRN: Dressing no longer intact (i.e. lifting, leaking, damaged), Dressing damp, moist or saturated - Rogue LOISE Kiang, NP  10/02/24 0935 Wound Management: Use Associated Wounds location; Wound care per algorithm Routine, Per algorithm, On Fri 10/02/24 at 0936, For 1 occurrence Wound Surgical Wound Quadrant, abdomen Lower;Midline/Middle/Center  Wound Surgical Wound Quadrant, abdomen Right;Lateral  Wound Pressure Injury Sacrum  Nephrostomy Left  Ostomy Urostomy RLQ  Ostomy Ileostomy RUQ  Wound Pressure Injury Heel/Calcaneus Right  Wound Pressure Injury Heel/Calcaneus Left  Initiate wound care protocols Wound Location: Use Associated Wounds location Wound Management:  Wound care per algorithm Initiate  wound care protocols Rogue LOISE Kiang, NP     Wound Surgical Wound Quadrant, abdomen Right;Lateral (Active)  Date First Assessed/Time First Assessed: 10/01/24 1717   Wound Type - REQUIRED: Surgical Wound  Pre-Existing Wound: Yes  Anatomical Site: Quadrant, abdomen  Wound Location Orientation: (c) Right;Lateral  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    Assessments 10/14/2024  1:07 PM  Wound Image    Wound Length (cm) 1.5 cm  Wound Width (cm) 2.9 cm  Wound Depth (cm) 1.3  Calculated Wound Size (cm^2) 4.35 cm^2  Calculated Wound Size (cm^3) 5.66 cm^3  Change in Wound Size %  58.07  Extent of Tissue Loss Full thickness tissue loss  Undermining None present  Tunneling - Location and Depth 2cm at 12 o'clock  Granulation Tissue Bright, beefy red. <75% and >25% of wound filled  Epithelialization 25% to <50% wound covered  Necrotic Tissue Type None visible  Necrotic Tissue Amount None visible  Wound Edges Well-defined, not attached to wound base  Periwound Skin Color Pink or normal for ethnic group  Periwound Skin Edema No swelling or edema  Periwound Skin Induration None present  Exudate Type Serosanguineous: thin watery, pale re/pink  Exudate Amount Scant, wound moist but no observable exudate  Odor None  Wound Management Cleansed;Barrier Film;Alginate;Foam;Gauze  Dressing Changed Changed  Dressing Status Clean;Dry;Intact     Active Orders  Date Order Order Summary Order Comments Authorizing Provider  10/02/24 [phone] Wound Management: Other - enter in comments; Wound care per provider; Monitor Fistula bag and ostomy bag to right addomen; Dressing damp, moist or saturated, Dressing no longer intact (i.e. lifting, leaking, damaged) Routine, Every 4 hours, First occurrence on Fri 10/02/24 at 0939, Until Specified Wound Surgical Wound Quadrant, abdomen Lower;Midline/Middle/Center  Wound Surgical Wound Quadrant, abdomen Right;Lateral  Wound  Location: Other - enter in comments Wound Management: Wound care per provider Treatment Order: Monitor Fistula bag and ostomy bag to right addomen Change/PRN: Dressing damp, moist or saturated, Dressing no longer intact (i.e. lifting, leaking, damaged) - Rogue LOISE Kiang, NP  10/02/24 0938 Wound Management: Use Associated Wounds location (right lateral abdomen/flank); Wound care per specified algorithm/order; Kathrine; Normal Saline; Prep with skin prep and allow to dry; Alginate silver; Adhesive foam; Dressing no longer intact (i.e. lif... Routine, 2 times daily, First occurrence on Fri 10/02/24 at 1000, Until Specified Wound Surgical Wound Quadrant, abdomen Right;Lateral  Wound Location: Use Associated Wounds location / right lateral abdomen/flank Wound Management: Wound care per specified algorithm/order Type: Wet Cleanse: Normal Saline Prep: Prep with skin prep and allow to dry Fill/Apply: Alginate silver Cover: Adhesive foam Change/PRN: Dressing no longer intact (i.e. lifting, leaking, damaged), Dressing damp, moist or saturated - Rogue LOISE Kiang, NP  10/02/24 0935 Wound Management: Use Associated Wounds location; Wound care per algorithm Routine, Per algorithm, On Fri 10/02/24 at 0936, For 1 occurrence Wound Surgical Wound Quadrant, abdomen Lower;Midline/Middle/Center  Wound Surgical Wound Quadrant, abdomen Right;Lateral  Wound Pressure Injury Sacrum  Nephrostomy Left  Ostomy Urostomy RLQ  Ostomy Ileostomy RUQ  Wound Pressure Injury Heel/Calcaneus Right  Wound Pressure Injury Heel/Calcaneus Left  Initiate wound care protocols Wound Location: Use Associated Wounds location Wound Management: Wound care per algorithm Initiate wound care protocols Rogue LOISE Kiang, NP     Wound Pressure Injury Sacrum (Active)  Date First Assessed/Time First Assessed: 10/01/24 1726   Wound Type - REQUIRED: Pressure Injury  Pre-Existing Wound: Yes  Anatomical Site: Sacrum    Assessments 10/14/2024  1:07 PM   Wound Image  Stage Stage 3  Wound Length (cm) 0 cm  Wound Width (cm) 0 cm  Wound Depth (cm) 0  Calculated Wound Size (cm^2) 0 cm^2  Calculated Wound Size (cm^3) 0 cm^3  Epithelialization 100% wound covered, surface intact  Exudate Type None  Exudate Amount None, dry wound     Active Orders  Date Order Order Summary Order Comments Authorizing Provider  10/02/24 0935 Wound Management: Use Associated Wounds location; Wound care per algorithm Routine, Per algorithm, On Fri 10/02/24 at 0936, For 1 occurrence Wound Surgical Wound Quadrant, abdomen Lower;Midline/Middle/Center  Wound Surgical Wound Quadrant, abdomen Right;Lateral  Wound Pressure Injury Sacrum  Nephrostomy Left  Ostomy Urostomy RLQ  Ostomy Ileostomy RUQ  Wound Pressure Injury Heel/Calcaneus Right  Wound Pressure Injury Heel/Calcaneus Left  Initiate wound care protocols Wound Location: Use Associated Wounds location Wound Management: Wound care per algorithm Initiate wound care protocols Rogue LOISE Kiang, NP     Ostomy Urostomy RLQ (Active)  No Date First Assessed or Time First Assessed found.   Ostomy Type: Urostomy  Location: RLQ    Assessments 10/14/2024  1:07 PM  Ostomy Color Pink;Red  Ostomy Viability Moist  Ostomy Tone Firm  Ostomy Profile Moderately protruding 1-3cm  Ostomy Shape Oval  Mucocutaneous Junction Intact  Peristomal Skin Intact  Treatment Bag change;Site care  Stomal Appliance 2 piece     Active Orders  Date Order Order Summary Order Comments Authorizing Provider  10/02/24 0935 Wound Management: Use Associated Wounds location; Wound care per algorithm Routine, Per algorithm, On Fri 10/02/24 at 0936, For 1 occurrence Wound Surgical Wound Quadrant, abdomen Lower;Midline/Middle/Center  Wound Surgical Wound Quadrant, abdomen Right;Lateral  Wound Pressure Injury Sacrum  Nephrostomy Left  Ostomy Urostomy RLQ  Ostomy Ileostomy RUQ  Wound Pressure Injury Heel/Calcaneus Right  Wound Pressure Injury  Heel/Calcaneus Left  Initiate wound care protocols Wound Location: Use Associated Wounds location Wound Management: Wound care per algorithm Initiate wound care protocols Rogue LOISE Kiang, NP     Ostomy Ileostomy RUQ (Active)  No Date First Assessed or Time First Assessed found.   Ostomy Type: Ileostomy  Location: RUQ    Assessments 10/14/2024  1:07 PM  Ostomy Color Pink;Red  Ostomy Viability Moist  Ostomy Tone Firm  Ostomy Profile Moderately protruding 1-3cm  Ostomy Shape Round  Mucocutaneous Junction Intact  Peristomal Skin Intact  Treatment Bag change;Site care  Stomal Appliance 1 piece     Active Orders  Date Order Order Summary Order Comments Authorizing Provider  10/02/24 0935 Wound Management: Use Associated Wounds location; Wound care per algorithm Routine, Per algorithm, On Fri 10/02/24 at 0936, For 1 occurrence Wound Surgical Wound Quadrant, abdomen Lower;Midline/Middle/Center  Wound Surgical Wound Quadrant, abdomen Right;Lateral  Wound Pressure Injury Sacrum  Nephrostomy Left  Ostomy Urostomy RLQ  Ostomy Ileostomy RUQ  Wound Pressure Injury Heel/Calcaneus Right  Wound Pressure Injury Heel/Calcaneus Left  Initiate wound care protocols Wound Location: Use Associated Wounds location Wound Management: Wound care per algorithm Initiate wound care protocols Rogue LOISE Kiang, NP     Wound Pressure Injury Heel/Calcaneus Right (Active)  Date First Assessed/Time First Assessed: 10/02/24 0919   Wound Type - REQUIRED: Pressure Injury  Pre-Existing Wound: Yes  Anatomical Site: Heel/Calcaneus  Wound Location Orientation: Right    Assessments 10/14/2024  1:07 PM  Wound Image    Stage DTPI  Wound Length (cm) 0 cm  Wound Width (cm) 0 cm  Wound Depth (cm) 0  Calculated Wound Size (cm^2) 0 cm^2  Calculated Wound Size (cm^3) 0  cm^3  Epithelialization 100% wound covered, surface intact  Exudate Type None  Exudate Amount None, dry wound     Active Orders  Date Order Order Summary  Order Comments Authorizing Provider  10/02/24 0935 Wound Management: Use Associated Wounds location; Wound care per algorithm Routine, Per algorithm, On Fri 10/02/24 at 0936, For 1 occurrence Wound Surgical Wound Quadrant, abdomen Lower;Midline/Middle/Center  Wound Surgical Wound Quadrant, abdomen Right;Lateral  Wound Pressure Injury Sacrum  Nephrostomy Left  Ostomy Urostomy RLQ  Ostomy Ileostomy RUQ  Wound Pressure Injury Heel/Calcaneus Right  Wound Pressure Injury Heel/Calcaneus Left  Initiate wound care protocols Wound Location: Use Associated Wounds location Wound Management: Wound care per algorithm Initiate wound care protocols Rogue LOISE Kiang, NP     Wound Pressure Injury Heel/Calcaneus Left (Active)  Date First Assessed/Time First Assessed: 10/02/24 0919   Wound Type - REQUIRED: Pressure Injury  Pre-Existing Wound: Yes  Anatomical Site: Heel/Calcaneus  Wound Location Orientation: Left    Assessments 10/14/2024  1:07 PM  Wound Image    Stage DTPI  Wound Length (cm) 0 cm  Wound Width (cm) 0 cm  Wound Depth (cm) 0  Calculated Wound Size (cm^2) 0 cm^2  Calculated Wound Size (cm^3) 0 cm^3  Epithelialization 100% wound covered, surface intact  Exudate Type None  Exudate Amount None, dry wound     Active Orders  Date Order Order Summary Order Comments Authorizing Provider  10/02/24 0935 Wound Management: Use Associated Wounds location; Wound care per algorithm Routine, Per algorithm, On Fri 10/02/24 at 0936, For 1 occurrence Wound Surgical Wound Quadrant, abdomen Lower;Midline/Middle/Center  Wound Surgical Wound Quadrant, abdomen Right;Lateral  Wound Pressure Injury Sacrum  Nephrostomy Left  Ostomy Urostomy RLQ  Ostomy Ileostomy RUQ  Wound Pressure Injury Heel/Calcaneus Right  Wound Pressure Injury Heel/Calcaneus Left  Initiate wound care protocols Wound Location: Use Associated Wounds location Wound Management: Wound care per algorithm Initiate wound care protocols Rogue LOISE Kiang, NP    Recommendations: Plan for discharge today.  Signature: JEANNETTE KIRSCH, RN Date: 10/14/2024 Time: 1:11 PM EST       [1] Patient Active Problem List Diagnosis  . Infectious disease of abdomen

## 2024-10-22 ENCOUNTER — Ambulatory Visit: Admitting: Internal Medicine

## 2024-10-26 ENCOUNTER — Ambulatory Visit (HOSPITAL_COMMUNITY)
Admission: RE | Admit: 2024-10-26 | Discharge: 2024-10-26 | Disposition: A | Discharge status: Home / Self Care | Source: Ambulatory Visit | Attending: Urology | Admitting: Urology

## 2024-10-26 DIAGNOSIS — Z436 Encounter for attention to other artificial openings of urinary tract: Secondary | ICD-10-CM | POA: Insufficient documentation

## 2024-10-26 DIAGNOSIS — Z8551 Personal history of malignant neoplasm of bladder: Secondary | ICD-10-CM | POA: Diagnosis not present

## 2024-10-26 DIAGNOSIS — N135 Crossing vessel and stricture of ureter without hydronephrosis: Secondary | ICD-10-CM

## 2024-10-26 HISTORY — PX: IR EXT NEPHROURETERAL CATH EXCHANGE: IMG5418

## 2024-10-26 HISTORY — PX: IR NEPHROSTOMY EXCHANGE LEFT: IMG6069

## 2024-10-26 MED ORDER — LIDOCAINE HCL 1 % IJ SOLN
20.0000 mL | Freq: Once | INTRAMUSCULAR | Status: AC
  Administered 2024-10-26: 15:00:00 2 mL via INTRADERMAL

## 2024-10-26 MED ORDER — LIDOCAINE HCL 1 % IJ SOLN
INTRAMUSCULAR | Status: AC
  Filled 2024-10-26: qty 20

## 2024-10-26 MED ORDER — IOHEXOL 300 MG/ML  SOLN
50.0000 mL | Freq: Once | INTRAMUSCULAR | Status: AC | PRN
  Administered 2024-10-26: 15:00:00 16 mL

## 2024-10-27 ENCOUNTER — Other Ambulatory Visit (HOSPITAL_COMMUNITY): Payer: Self-pay | Admitting: Urology

## 2024-10-27 DIAGNOSIS — Z8551 Personal history of malignant neoplasm of bladder: Secondary | ICD-10-CM

## 2024-11-12 DEATH — deceased

## 2024-11-16 ENCOUNTER — Encounter: Payer: PRIVATE HEALTH INSURANCE | Admitting: Physical Medicine and Rehabilitation

## 2024-11-18 ENCOUNTER — Ambulatory Visit: Admitting: Internal Medicine

## 2024-11-20 ENCOUNTER — Ambulatory Visit: Admitting: Internal Medicine

## 2024-11-30 ENCOUNTER — Telehealth: Payer: Self-pay | Admitting: Internal Medicine

## 2024-11-30 NOTE — Telephone Encounter (Signed)
 Chart marked deceased and verified that patient is deceased

## 2024-11-30 NOTE — Telephone Encounter (Signed)
 Endrit's wife, Adrien, LVM to cancel his upcoming appt. She stated he passed away 12/30/205.

## 2024-12-10 ENCOUNTER — Ambulatory Visit: Payer: PRIVATE HEALTH INSURANCE | Admitting: Internal Medicine

## 2024-12-21 ENCOUNTER — Other Ambulatory Visit (HOSPITAL_COMMUNITY)
